# Patient Record
Sex: Male | Born: 1945
Health system: Southern US, Community
[De-identification: ages and names within clinical notes are randomized; demographics above are authoritative.]

## PROBLEM LIST (undated history)

## (undated) DIAGNOSIS — I10 Essential (primary) hypertension: Secondary | ICD-10-CM

## (undated) DIAGNOSIS — K219 Gastro-esophageal reflux disease without esophagitis: Secondary | ICD-10-CM

## (undated) DIAGNOSIS — G629 Polyneuropathy, unspecified: Secondary | ICD-10-CM

## (undated) DIAGNOSIS — C3491 Malignant neoplasm of unspecified part of right bronchus or lung: Secondary | ICD-10-CM

## (undated) DIAGNOSIS — J449 Chronic obstructive pulmonary disease, unspecified: Secondary | ICD-10-CM

## (undated) DIAGNOSIS — I4892 Unspecified atrial flutter: Secondary | ICD-10-CM

## (undated) DIAGNOSIS — Z9289 Personal history of other medical treatment: Secondary | ICD-10-CM

## (undated) DIAGNOSIS — J45909 Unspecified asthma, uncomplicated: Secondary | ICD-10-CM

## (undated) DIAGNOSIS — N419 Inflammatory disease of prostate, unspecified: Secondary | ICD-10-CM

## (undated) DIAGNOSIS — F32A Depression, unspecified: Secondary | ICD-10-CM

## (undated) DIAGNOSIS — E785 Hyperlipidemia, unspecified: Secondary | ICD-10-CM

## (undated) DIAGNOSIS — E119 Type 2 diabetes mellitus without complications: Secondary | ICD-10-CM

## (undated) DIAGNOSIS — D509 Iron deficiency anemia, unspecified: Secondary | ICD-10-CM

## (undated) DIAGNOSIS — R0600 Dyspnea, unspecified: Secondary | ICD-10-CM

## (undated) DIAGNOSIS — M1990 Unspecified osteoarthritis, unspecified site: Secondary | ICD-10-CM

## (undated) DIAGNOSIS — I509 Heart failure, unspecified: Secondary | ICD-10-CM

## (undated) DIAGNOSIS — R911 Solitary pulmonary nodule: Secondary | ICD-10-CM

## (undated) DIAGNOSIS — G4730 Sleep apnea, unspecified: Secondary | ICD-10-CM

## (undated) DIAGNOSIS — E039 Hypothyroidism, unspecified: Secondary | ICD-10-CM

## (undated) DIAGNOSIS — H3530 Unspecified macular degeneration: Secondary | ICD-10-CM

## (undated) DIAGNOSIS — F419 Anxiety disorder, unspecified: Secondary | ICD-10-CM

## (undated) DIAGNOSIS — Z9981 Dependence on supplemental oxygen: Secondary | ICD-10-CM

## (undated) DIAGNOSIS — I739 Peripheral vascular disease, unspecified: Secondary | ICD-10-CM

## (undated) DIAGNOSIS — N400 Enlarged prostate without lower urinary tract symptoms: Secondary | ICD-10-CM

## (undated) DIAGNOSIS — Z923 Personal history of irradiation: Secondary | ICD-10-CM

## (undated) DIAGNOSIS — F329 Major depressive disorder, single episode, unspecified: Secondary | ICD-10-CM

## (undated) DIAGNOSIS — D649 Anemia, unspecified: Secondary | ICD-10-CM

## (undated) DIAGNOSIS — Z87442 Personal history of urinary calculi: Secondary | ICD-10-CM

## (undated) HISTORY — DX: Anemia, unspecified: D64.9

## (undated) HISTORY — DX: Polyneuropathy, unspecified: G62.9

## (undated) HISTORY — DX: Inflammatory disease of prostate, unspecified: N41.9

## (undated) HISTORY — DX: Malignant neoplasm of unspecified part of right bronchus or lung: C34.91

## (undated) HISTORY — DX: Hyperlipidemia, unspecified: E78.5

## (undated) HISTORY — DX: Unspecified atrial flutter: I48.92

## (undated) HISTORY — DX: Type 2 diabetes mellitus without complications: E11.9

## (undated) HISTORY — DX: Chronic obstructive pulmonary disease, unspecified: J44.9

## (undated) HISTORY — DX: Solitary pulmonary nodule: R91.1

## (undated) HISTORY — PX: CATARACT EXTRACTION, BILATERAL: SHX1313

## (undated) HISTORY — PX: NO PAST SURGERIES: SHX2092

---

## 2008-10-26 DIAGNOSIS — R079 Chest pain, unspecified: Secondary | ICD-10-CM | POA: Insufficient documentation

## 2014-04-10 DIAGNOSIS — R3915 Urgency of urination: Secondary | ICD-10-CM | POA: Diagnosis not present

## 2014-04-10 DIAGNOSIS — R3912 Poor urinary stream: Secondary | ICD-10-CM | POA: Diagnosis not present

## 2014-04-10 DIAGNOSIS — R3911 Hesitancy of micturition: Secondary | ICD-10-CM | POA: Diagnosis not present

## 2014-04-10 DIAGNOSIS — R300 Dysuria: Secondary | ICD-10-CM | POA: Diagnosis not present

## 2014-04-11 DIAGNOSIS — L97411 Non-pressure chronic ulcer of right heel and midfoot limited to breakdown of skin: Secondary | ICD-10-CM | POA: Diagnosis not present

## 2014-04-25 DIAGNOSIS — L97411 Non-pressure chronic ulcer of right heel and midfoot limited to breakdown of skin: Secondary | ICD-10-CM | POA: Diagnosis not present

## 2014-05-16 DIAGNOSIS — B351 Tinea unguium: Secondary | ICD-10-CM | POA: Diagnosis not present

## 2014-05-16 DIAGNOSIS — L84 Corns and callosities: Secondary | ICD-10-CM | POA: Diagnosis not present

## 2014-05-16 DIAGNOSIS — E1142 Type 2 diabetes mellitus with diabetic polyneuropathy: Secondary | ICD-10-CM | POA: Diagnosis not present

## 2014-06-06 DIAGNOSIS — R3911 Hesitancy of micturition: Secondary | ICD-10-CM | POA: Diagnosis not present

## 2014-06-06 DIAGNOSIS — R3912 Poor urinary stream: Secondary | ICD-10-CM | POA: Diagnosis not present

## 2014-06-06 DIAGNOSIS — N401 Enlarged prostate with lower urinary tract symptoms: Secondary | ICD-10-CM | POA: Diagnosis not present

## 2014-06-06 DIAGNOSIS — R3915 Urgency of urination: Secondary | ICD-10-CM | POA: Diagnosis not present

## 2014-07-10 DIAGNOSIS — I10 Essential (primary) hypertension: Secondary | ICD-10-CM | POA: Diagnosis not present

## 2014-07-10 DIAGNOSIS — Z23 Encounter for immunization: Secondary | ICD-10-CM | POA: Diagnosis not present

## 2014-07-10 DIAGNOSIS — R5383 Other fatigue: Secondary | ICD-10-CM | POA: Diagnosis not present

## 2014-07-10 DIAGNOSIS — E1159 Type 2 diabetes mellitus with other circulatory complications: Secondary | ICD-10-CM | POA: Diagnosis not present

## 2014-07-10 DIAGNOSIS — M542 Cervicalgia: Secondary | ICD-10-CM | POA: Diagnosis not present

## 2014-08-15 DIAGNOSIS — B351 Tinea unguium: Secondary | ICD-10-CM | POA: Diagnosis not present

## 2014-08-15 DIAGNOSIS — E1142 Type 2 diabetes mellitus with diabetic polyneuropathy: Secondary | ICD-10-CM | POA: Diagnosis not present

## 2014-08-15 DIAGNOSIS — L84 Corns and callosities: Secondary | ICD-10-CM | POA: Diagnosis not present

## 2014-08-22 DIAGNOSIS — L97512 Non-pressure chronic ulcer of other part of right foot with fat layer exposed: Secondary | ICD-10-CM | POA: Diagnosis not present

## 2014-09-15 DIAGNOSIS — H3531 Nonexudative age-related macular degeneration: Secondary | ICD-10-CM | POA: Diagnosis not present

## 2014-09-15 DIAGNOSIS — H04123 Dry eye syndrome of bilateral lacrimal glands: Secondary | ICD-10-CM | POA: Diagnosis not present

## 2014-09-15 DIAGNOSIS — E119 Type 2 diabetes mellitus without complications: Secondary | ICD-10-CM | POA: Diagnosis not present

## 2014-09-15 DIAGNOSIS — Z961 Presence of intraocular lens: Secondary | ICD-10-CM | POA: Diagnosis not present

## 2014-09-16 DIAGNOSIS — Z91018 Allergy to other foods: Secondary | ICD-10-CM | POA: Diagnosis not present

## 2014-09-16 DIAGNOSIS — T781XXA Other adverse food reactions, not elsewhere classified, initial encounter: Secondary | ICD-10-CM | POA: Diagnosis not present

## 2014-09-16 DIAGNOSIS — Z79899 Other long term (current) drug therapy: Secondary | ICD-10-CM | POA: Diagnosis not present

## 2014-09-16 DIAGNOSIS — Z7951 Long term (current) use of inhaled steroids: Secondary | ICD-10-CM | POA: Diagnosis not present

## 2014-09-16 DIAGNOSIS — F17200 Nicotine dependence, unspecified, uncomplicated: Secondary | ICD-10-CM | POA: Diagnosis not present

## 2014-09-16 DIAGNOSIS — T7840XA Allergy, unspecified, initial encounter: Secondary | ICD-10-CM | POA: Diagnosis not present

## 2014-10-13 DIAGNOSIS — H04123 Dry eye syndrome of bilateral lacrimal glands: Secondary | ICD-10-CM | POA: Diagnosis not present

## 2014-10-13 DIAGNOSIS — H1032 Unspecified acute conjunctivitis, left eye: Secondary | ICD-10-CM | POA: Diagnosis not present

## 2014-10-13 DIAGNOSIS — Z961 Presence of intraocular lens: Secondary | ICD-10-CM | POA: Diagnosis not present

## 2014-11-09 DIAGNOSIS — L84 Corns and callosities: Secondary | ICD-10-CM | POA: Diagnosis not present

## 2014-11-09 DIAGNOSIS — E1142 Type 2 diabetes mellitus with diabetic polyneuropathy: Secondary | ICD-10-CM | POA: Diagnosis not present

## 2014-11-13 DIAGNOSIS — E1159 Type 2 diabetes mellitus with other circulatory complications: Secondary | ICD-10-CM | POA: Diagnosis not present

## 2014-11-13 DIAGNOSIS — E11621 Type 2 diabetes mellitus with foot ulcer: Secondary | ICD-10-CM | POA: Diagnosis not present

## 2014-11-13 DIAGNOSIS — E1143 Type 2 diabetes mellitus with diabetic autonomic (poly)neuropathy: Secondary | ICD-10-CM | POA: Diagnosis not present

## 2014-11-13 DIAGNOSIS — L97519 Non-pressure chronic ulcer of other part of right foot with unspecified severity: Secondary | ICD-10-CM | POA: Diagnosis not present

## 2014-11-13 DIAGNOSIS — E1169 Type 2 diabetes mellitus with other specified complication: Secondary | ICD-10-CM | POA: Diagnosis not present

## 2014-11-13 DIAGNOSIS — E785 Hyperlipidemia, unspecified: Secondary | ICD-10-CM | POA: Diagnosis not present

## 2014-11-13 DIAGNOSIS — I739 Peripheral vascular disease, unspecified: Secondary | ICD-10-CM | POA: Diagnosis not present

## 2014-11-13 DIAGNOSIS — W57XXXA Bitten or stung by nonvenomous insect and other nonvenomous arthropods, initial encounter: Secondary | ICD-10-CM | POA: Diagnosis not present

## 2014-11-13 DIAGNOSIS — I10 Essential (primary) hypertension: Secondary | ICD-10-CM | POA: Diagnosis not present

## 2014-11-30 DIAGNOSIS — L568 Other specified acute skin changes due to ultraviolet radiation: Secondary | ICD-10-CM | POA: Diagnosis not present

## 2014-12-22 DIAGNOSIS — R3911 Hesitancy of micturition: Secondary | ICD-10-CM | POA: Diagnosis not present

## 2014-12-22 DIAGNOSIS — N401 Enlarged prostate with lower urinary tract symptoms: Secondary | ICD-10-CM | POA: Diagnosis not present

## 2014-12-22 DIAGNOSIS — R3912 Poor urinary stream: Secondary | ICD-10-CM | POA: Diagnosis not present

## 2015-02-16 DIAGNOSIS — E1169 Type 2 diabetes mellitus with other specified complication: Secondary | ICD-10-CM | POA: Diagnosis not present

## 2015-02-16 DIAGNOSIS — Z23 Encounter for immunization: Secondary | ICD-10-CM | POA: Diagnosis not present

## 2015-02-16 DIAGNOSIS — E785 Hyperlipidemia, unspecified: Secondary | ICD-10-CM | POA: Diagnosis not present

## 2015-02-16 DIAGNOSIS — F17200 Nicotine dependence, unspecified, uncomplicated: Secondary | ICD-10-CM | POA: Diagnosis not present

## 2015-02-16 DIAGNOSIS — I10 Essential (primary) hypertension: Secondary | ICD-10-CM | POA: Diagnosis not present

## 2015-02-16 DIAGNOSIS — E1143 Type 2 diabetes mellitus with diabetic autonomic (poly)neuropathy: Secondary | ICD-10-CM | POA: Diagnosis not present

## 2015-02-16 DIAGNOSIS — E1151 Type 2 diabetes mellitus with diabetic peripheral angiopathy without gangrene: Secondary | ICD-10-CM | POA: Diagnosis not present

## 2015-04-05 DIAGNOSIS — S90422A Blister (nonthermal), left great toe, initial encounter: Secondary | ICD-10-CM | POA: Diagnosis not present

## 2015-04-13 DIAGNOSIS — G4733 Obstructive sleep apnea (adult) (pediatric): Secondary | ICD-10-CM | POA: Diagnosis not present

## 2015-04-17 DIAGNOSIS — B351 Tinea unguium: Secondary | ICD-10-CM | POA: Diagnosis not present

## 2015-04-17 DIAGNOSIS — E1142 Type 2 diabetes mellitus with diabetic polyneuropathy: Secondary | ICD-10-CM | POA: Diagnosis not present

## 2015-04-17 DIAGNOSIS — L84 Corns and callosities: Secondary | ICD-10-CM | POA: Diagnosis not present

## 2015-06-18 DIAGNOSIS — E1143 Type 2 diabetes mellitus with diabetic autonomic (poly)neuropathy: Secondary | ICD-10-CM | POA: Diagnosis not present

## 2015-06-18 DIAGNOSIS — E1169 Type 2 diabetes mellitus with other specified complication: Secondary | ICD-10-CM | POA: Diagnosis not present

## 2015-06-18 DIAGNOSIS — F17200 Nicotine dependence, unspecified, uncomplicated: Secondary | ICD-10-CM | POA: Diagnosis not present

## 2015-06-18 DIAGNOSIS — I10 Essential (primary) hypertension: Secondary | ICD-10-CM | POA: Diagnosis not present

## 2015-06-18 DIAGNOSIS — J449 Chronic obstructive pulmonary disease, unspecified: Secondary | ICD-10-CM | POA: Diagnosis not present

## 2015-06-18 DIAGNOSIS — E785 Hyperlipidemia, unspecified: Secondary | ICD-10-CM | POA: Diagnosis not present

## 2015-06-18 DIAGNOSIS — E1151 Type 2 diabetes mellitus with diabetic peripheral angiopathy without gangrene: Secondary | ICD-10-CM | POA: Diagnosis not present

## 2015-06-26 DIAGNOSIS — L84 Corns and callosities: Secondary | ICD-10-CM | POA: Diagnosis not present

## 2015-06-26 DIAGNOSIS — E1142 Type 2 diabetes mellitus with diabetic polyneuropathy: Secondary | ICD-10-CM | POA: Diagnosis not present

## 2015-06-26 DIAGNOSIS — B351 Tinea unguium: Secondary | ICD-10-CM | POA: Diagnosis not present

## 2015-08-24 DIAGNOSIS — H35031 Hypertensive retinopathy, right eye: Secondary | ICD-10-CM | POA: Diagnosis not present

## 2015-08-24 DIAGNOSIS — E119 Type 2 diabetes mellitus without complications: Secondary | ICD-10-CM | POA: Diagnosis not present

## 2015-08-24 DIAGNOSIS — H353131 Nonexudative age-related macular degeneration, bilateral, early dry stage: Secondary | ICD-10-CM | POA: Diagnosis not present

## 2015-08-24 DIAGNOSIS — I10 Essential (primary) hypertension: Secondary | ICD-10-CM | POA: Diagnosis not present

## 2015-10-19 DIAGNOSIS — H04123 Dry eye syndrome of bilateral lacrimal glands: Secondary | ICD-10-CM | POA: Diagnosis not present

## 2015-10-19 DIAGNOSIS — Z961 Presence of intraocular lens: Secondary | ICD-10-CM | POA: Diagnosis not present

## 2015-10-19 DIAGNOSIS — H43812 Vitreous degeneration, left eye: Secondary | ICD-10-CM | POA: Diagnosis not present

## 2015-10-19 DIAGNOSIS — H353131 Nonexudative age-related macular degeneration, bilateral, early dry stage: Secondary | ICD-10-CM | POA: Diagnosis not present

## 2015-11-16 DIAGNOSIS — I10 Essential (primary) hypertension: Secondary | ICD-10-CM | POA: Diagnosis not present

## 2015-11-16 DIAGNOSIS — F17200 Nicotine dependence, unspecified, uncomplicated: Secondary | ICD-10-CM | POA: Diagnosis not present

## 2015-11-16 DIAGNOSIS — Z1159 Encounter for screening for other viral diseases: Secondary | ICD-10-CM | POA: Diagnosis not present

## 2015-11-16 DIAGNOSIS — R3911 Hesitancy of micturition: Secondary | ICD-10-CM | POA: Diagnosis not present

## 2015-11-16 DIAGNOSIS — J449 Chronic obstructive pulmonary disease, unspecified: Secondary | ICD-10-CM | POA: Diagnosis not present

## 2015-11-16 DIAGNOSIS — E785 Hyperlipidemia, unspecified: Secondary | ICD-10-CM | POA: Diagnosis not present

## 2015-11-16 DIAGNOSIS — E1151 Type 2 diabetes mellitus with diabetic peripheral angiopathy without gangrene: Secondary | ICD-10-CM | POA: Diagnosis not present

## 2015-11-16 DIAGNOSIS — E1169 Type 2 diabetes mellitus with other specified complication: Secondary | ICD-10-CM | POA: Diagnosis not present

## 2015-11-22 DIAGNOSIS — L84 Corns and callosities: Secondary | ICD-10-CM | POA: Diagnosis not present

## 2015-11-22 DIAGNOSIS — E1142 Type 2 diabetes mellitus with diabetic polyneuropathy: Secondary | ICD-10-CM | POA: Diagnosis not present

## 2015-11-22 DIAGNOSIS — B351 Tinea unguium: Secondary | ICD-10-CM | POA: Diagnosis not present

## 2015-12-07 ENCOUNTER — Ambulatory Visit (INDEPENDENT_AMBULATORY_CARE_PROVIDER_SITE_OTHER): Payer: Medicare Other | Admitting: Family Medicine

## 2015-12-07 ENCOUNTER — Encounter: Payer: Self-pay | Admitting: Family Medicine

## 2015-12-07 ENCOUNTER — Encounter (INDEPENDENT_AMBULATORY_CARE_PROVIDER_SITE_OTHER): Payer: Self-pay

## 2015-12-07 VITALS — BP 153/77 | HR 80 | Temp 97.1°F | Wt 181.2 lb

## 2015-12-07 DIAGNOSIS — E1142 Type 2 diabetes mellitus with diabetic polyneuropathy: Secondary | ICD-10-CM | POA: Diagnosis not present

## 2015-12-07 DIAGNOSIS — J0100 Acute maxillary sinusitis, unspecified: Secondary | ICD-10-CM | POA: Diagnosis not present

## 2015-12-07 LAB — GLUCOSE HEMOCUE WAIVED: GLU HEMOCUE WAIVED: 127 mg/dL — AB (ref 65–99)

## 2015-12-07 MED ORDER — FLUTICASONE PROPIONATE 50 MCG/ACT NA SUSP: 2.0000 | Freq: Every day | NASAL | 6 refills | Status: DC

## 2015-12-07 MED ORDER — AMOXICILLIN 875 MG PO TABS: 875.0000 mg | ORAL_TABLET | Freq: Two times a day (BID) | ORAL | 0 refills | Status: DC

## 2015-12-07 NOTE — Progress Notes (Signed)
   Subjective:    Patient ID: John [name], male    DOB: 1946-02-18, 70 y.o.   MRN: [id number]  HPI New patient with history of diabetes, hypertension hyperlipidemia and BPH and COPD. Recently relocated to this area. Today's complaining of some sinus pressure pain cough symptoms have been present for greater than one week. Only medicine is taken has been ibuprofen. There are no active problems to display for this patient.  Outpatient Encounter Prescriptions as of 12/07/2015  Medication Sig  . ADVAIR HFA 45-21 MCG/ACT inhaler Inhale 2 puffs into the lungs daily as needed.  Marland Kitchen aspirin EC 81 MG tablet Take 81 mg by mouth daily.  . finasteride (PROSCAR) 5 MG tablet Take 1 tablet by mouth daily.  Marland Kitchen gabapentin (NEURONTIN) 300 MG capsule 1 tab in am, 2 tab in pm  . losartan-hydrochlorothiazide (HYZAAR) 100-25 MG tablet Take 1 tablet by mouth daily.  . meloxicam (MOBIC) 7.5 MG tablet Take 7.5 mg by mouth daily as needed for pain.  . pravastatin (PRAVACHOL) 40 MG tablet 1 tablet every third day  . SPIRIVA HANDIHALER 18 MCG inhalation capsule Place 1 capsule into inhaler and inhale daily as needed.  . tamsulosin (FLOMAX) 0.4 MG CAPS capsule Take 0.4 mg by mouth daily after breakfast.   No facility-administered encounter medications on file as of 12/07/2015.       Review of Systems  Constitutional: Negative.   Respiratory: Positive for shortness of breath.   Cardiovascular: Negative.   Genitourinary: Positive for difficulty urinating.  Neurological: Negative.   Psychiatric/Behavioral: Negative.        Objective:   Physical Exam BP (!) 153/77 (BP Location: Left Arm, Patient Position: Sitting, Cuff Size: Normal)   Pulse 80   Temp 97.1 F (36.2 C) (Oral)   Wt 181 lb 3.2 oz (82.2 kg)         Assessment & Plan:  1. Type 2 diabetes mellitus with diabetic polyneuropathy, without long-term current use of insulin (HCC) Fasting blood sugar today is 127. Given his history of some elevation of  liver functions and declining kidney function will await to see results of additional labs before initiating any treatment of diabetes I think sugars not so high today that there will be a problem with waiting - Glucose Hemocue Waived  2. Acute maxillary sinusitis, recurrence not specified We'll give him amoxicillin 875 twice a day along with Flonase.  Wardell Honour MD

## 2015-12-11 ENCOUNTER — Telehealth: Payer: Self-pay | Admitting: Family Medicine

## 2015-12-13 ENCOUNTER — Telehealth: Payer: Self-pay | Admitting: Family Medicine

## 2015-12-14 NOTE — Telephone Encounter (Signed)
Pt scheduled  

## 2015-12-28 ENCOUNTER — Ambulatory Visit (INDEPENDENT_AMBULATORY_CARE_PROVIDER_SITE_OTHER): Payer: Medicare Other

## 2015-12-28 DIAGNOSIS — Z23 Encounter for immunization: Secondary | ICD-10-CM

## 2016-01-31 DIAGNOSIS — E1142 Type 2 diabetes mellitus with diabetic polyneuropathy: Secondary | ICD-10-CM | POA: Diagnosis not present

## 2016-01-31 DIAGNOSIS — L84 Corns and callosities: Secondary | ICD-10-CM | POA: Diagnosis not present

## 2016-01-31 DIAGNOSIS — B351 Tinea unguium: Secondary | ICD-10-CM | POA: Diagnosis not present

## 2016-02-01 ENCOUNTER — Ambulatory Visit (INDEPENDENT_AMBULATORY_CARE_PROVIDER_SITE_OTHER): Payer: Medicare Other | Admitting: Family Medicine

## 2016-02-01 ENCOUNTER — Encounter: Payer: Self-pay | Admitting: Family Medicine

## 2016-02-01 VITALS — BP 136/79 | HR 83 | Temp 97.6°F | Ht 67.0 in | Wt 182.2 lb

## 2016-02-01 DIAGNOSIS — J449 Chronic obstructive pulmonary disease, unspecified: Secondary | ICD-10-CM | POA: Diagnosis not present

## 2016-02-01 DIAGNOSIS — N401 Enlarged prostate with lower urinary tract symptoms: Secondary | ICD-10-CM

## 2016-02-01 DIAGNOSIS — I10 Essential (primary) hypertension: Secondary | ICD-10-CM | POA: Diagnosis not present

## 2016-02-01 DIAGNOSIS — E1142 Type 2 diabetes mellitus with diabetic polyneuropathy: Secondary | ICD-10-CM

## 2016-02-01 DIAGNOSIS — E7800 Pure hypercholesterolemia, unspecified: Secondary | ICD-10-CM | POA: Diagnosis not present

## 2016-02-01 DIAGNOSIS — R3912 Poor urinary stream: Secondary | ICD-10-CM | POA: Diagnosis not present

## 2016-02-01 DIAGNOSIS — I152 Hypertension secondary to endocrine disorders: Secondary | ICD-10-CM | POA: Insufficient documentation

## 2016-02-01 DIAGNOSIS — N400 Enlarged prostate without lower urinary tract symptoms: Secondary | ICD-10-CM | POA: Insufficient documentation

## 2016-02-01 DIAGNOSIS — Z0000 Encounter for general adult medical examination without abnormal findings: Secondary | ICD-10-CM | POA: Diagnosis not present

## 2016-02-01 MED ORDER — UMECLIDINIUM BROMIDE 62.5 MCG/INH IN AEPB: 1.0000 | INHALATION_SPRAY | Freq: Every day | RESPIRATORY_TRACT | 2 refills | Status: DC

## 2016-02-01 MED ORDER — UMECLIDINIUM BROMIDE 62.5 MCG/INH IN AEPB: 1.0000 | INHALATION_SPRAY | Freq: Every day | RESPIRATORY_TRACT | Status: DC

## 2016-02-01 MED ORDER — IRBESARTAN-HYDROCHLOROTHIAZIDE 300-12.5 MG PO TABS: 0.5000 | ORAL_TABLET | Freq: Every day | ORAL | 1 refills | Status: DC

## 2016-02-01 NOTE — Progress Notes (Signed)
Subjective:    Patient ID: John [name], male    DOB: 09/14/1945, 70 y.o.   MRN: [id number]  HPI  Patient is here today for his annual physical exam.  Patient is also concerned about his blood pressure.  He reports it has been elevated at home. He has been taking losartan hydrochlorothiazide. I explained that this medicine may not be a true 24 hour drug as was previously followed in this may be impacting his blood pressure. He also brings in a note from his insurer saying they will not cover Spiriva anymore but would cover in [place] or a nora. Patient had seen urologist previously for prostatic hypertrophy. He was started on Proscar and Flomax. He is able to urinate easier now and wonders if he could perhaps stop the Flomax.   There are no active problems to display for this patient.  Outpatient Encounter Prescriptions as of 02/01/2016  Medication Sig  . ADVAIR HFA 45-21 MCG/ACT inhaler Inhale 2 puffs into the lungs daily as needed.  Marland Kitchen aspirin EC 81 MG tablet Take 81 mg by mouth daily.  . finasteride (PROSCAR) 5 MG tablet Take 1 tablet by mouth daily.  Marland Kitchen gabapentin (NEURONTIN) 300 MG capsule 1 tab in am, 2 tab in pm  . losartan-hydrochlorothiazide (HYZAAR) 100-25 MG tablet Take 1 tablet by mouth daily.  . meloxicam (MOBIC) 7.5 MG tablet Take 7.5 mg by mouth daily as needed for pain.  . pravastatin (PRAVACHOL) 40 MG tablet 1 tablet every third day  . SPIRIVA HANDIHALER 18 MCG inhalation capsule Place 1 capsule into inhaler and inhale daily as needed.  . tamsulosin (FLOMAX) 0.4 MG CAPS capsule Take 0.4 mg by mouth daily after breakfast.  . fluticasone (FLONASE) 50 MCG/ACT nasal spray Place 2 sprays into both nostrils daily. (Patient not taking: Reported on 02/01/2016)  . [DISCONTINUED] amoxicillin (AMOXIL) 875 MG tablet Take 1 tablet (875 mg total) by mouth 2 (two) times daily.   No facility-administered encounter medications on file as of 02/01/2016.      Review of Systems    Constitutional: Negative.   HENT: Negative.   Eyes: Negative.   Respiratory: Negative.   Cardiovascular: Negative.   Gastrointestinal: Negative.   Endocrine: Negative.   Genitourinary: Negative.   Musculoskeletal: Negative.   Skin: Negative.   Allergic/Immunologic: Negative.   Neurological: Negative.   Hematological: Negative.   Psychiatric/Behavioral: Negative.        Objective:   Physical Exam  Constitutional: He is oriented to person, place, and time. He appears well-developed and well-nourished.  HENT:  Mouth/Throat: Oropharynx is clear and moist.  Cardiovascular: Normal rate, regular rhythm, normal heart sounds and intact distal pulses.   Pulmonary/Chest: Effort normal and breath sounds normal.  Abdominal: Soft. Bowel sounds are normal.  Genitourinary: Prostate normal.  Musculoskeletal: Normal range of motion.  Neurological: He is alert and oriented to person, place, and time.  Psychiatric: He has a normal mood and affect.    BP 136/79   Pulse 83   Temp 97.6 F (36.4 C) (Oral)   Ht _0  (1.702 m)   Wt 182 lb 4 oz (82.7 kg)   BMI 28.54 kg/m         Assessment & Plan:  1. 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 There is a history of COPD - CMP14+EGFR - Lipid panel - CBC with Differential/Platelet  2. Type 2 diabetes mellitus with diabetic  polyneuropathy, without long-term current use of insulin ([place]) Patient is not currently being treated. We need to see where we are today with his blood sugar and A1c. He is new to the practice and we do not have any data to make judgments yet - CMP14+EGFR - Lipid panel - CBC with Differential/Platelet  3. Essential hypertension Will change to losartan to irbesartan or similar ARB and monitor pressures - CMP14+EGFR - Lipid panel - CBC with Differential/Platelet  4. Pure hypercholesterolemia Assess with lipid panel today and decide about treatment  after we see his numbers - CMP14+EGFR - Lipid panel - CBC with Differential/Platelet  5. Chronic obstructive pulmonary disease, unspecified COPD type ([place])  to IncruseSwitch from Spiriva  6. Benign prostatic hyperplasia with weak urinary stream A new Proscar but hold Flomax to see if he still needs it since prostate has probably responded.  Wardell Honour MD

## 2016-02-01 NOTE — Patient Instructions (Signed)
Medicare Annual Wellness Visit  [place] and the medical providers at [facility] strive to bring you the best medical care.  In doing so we not only want to address your current medical conditions and concerns but also to detect new conditions early and prevent illness, disease and health-related problems.    Medicare offers a yearly Wellness Visit which allows our clinical staff to assess your need for preventative services including immunizations, lifestyle education, counseling to decrease risk of preventable diseases and screening for fall risk and other medical concerns.    This visit is provided free of charge (no copay) for all Medicare recipients. The clinical pharmacists at [facility] have begun to conduct these Wellness Visits which will also include a thorough review of all your medications.    As you primary medical provider recommend that you make an appointment for your Annual Wellness Visit if you have not done so already this year.  You may set up this appointment before you leave today or you may call back ([phone]) and schedule an appointment.  Please make sure when you call that you mention that you are scheduling your Annual Wellness Visit with the clinical pharmacist so that the appointment may be made for the proper length of time.     Continue current medications. Continue good therapeutic lifestyle changes which include good diet and exercise. Fall precautions discussed with patient. If an FOBT was given today- please return it to our front desk. If you are over 50 years old - you may need Prevnar 13 or the adult Pneumonia vaccine.  After your visit with us today you will receive a survey in the mail or online from [employer] regarding your care with us. Please take a moment to fill this out. Your feedback is very important to us as you can help us better understand your patient needs as well as improve  your experience and satisfaction. WE CARE ABOUT YOU!!!    

## 2016-02-02 LAB — CBC WITH DIFFERENTIAL/PLATELET
BASOS ABS: 0 10*3/uL (ref 0.0–0.2)
BASOS: 0 %
EOS (ABSOLUTE): 0.2 10*3/uL (ref 0.0–0.4)
EOS: 3 %
HEMATOCRIT: 40.1 % (ref 37.5–51.0)
HEMOGLOBIN: 14 g/dL (ref 12.6–17.7)
IMMATURE GRANS (ABS): 0 10*3/uL (ref 0.0–0.1)
Immature Granulocytes: 0 %
LYMPHS ABS: 1.6 10*3/uL (ref 0.7–3.1)
Lymphs: 22 %
MCH: 32.4 pg (ref 26.6–33.0)
MCHC: 34.9 g/dL (ref 31.5–35.7)
MCV: 93 fL (ref 79–97)
Monocytes Absolute: 0.8 10*3/uL (ref 0.1–0.9)
Monocytes: 11 %
NEUTROS ABS: 4.8 10*3/uL (ref 1.4–7.0)
NEUTROS PCT: 64 %
Platelets: 328 10*3/uL (ref 150–379)
RBC: 4.32 x10E6/uL (ref 4.14–5.80)
RDW: 14.2 % (ref 12.3–15.4)
WBC: 7.5 10*3/uL (ref 3.4–10.8)

## 2016-02-02 LAB — LIPID PANEL
CHOL/HDL RATIO: 5.6 ratio — AB (ref 0.0–5.0)
CHOLESTEROL TOTAL: 169 mg/dL (ref 100–199)
HDL: 30 mg/dL — ABNORMAL LOW (ref >39)
LDL CALC: 107 mg/dL — AB (ref 0–99)
Triglycerides: 159 mg/dL — ABNORMAL HIGH (ref 0–149)
VLDL Cholesterol Cal: 32 mg/dL (ref 5–40)

## 2016-02-02 LAB — CMP14+EGFR
A/G RATIO: 1.4 (ref 1.2–2.2)
ALBUMIN: 4.2 g/dL (ref 3.6–4.8)
ALK PHOS: 101 IU/L (ref 39–117)
ALT: 13 IU/L (ref 0–44)
AST: 18 IU/L (ref 0–40)
BUN / CREAT RATIO: 17 (ref 10–24)
BUN: 17 mg/dL (ref 8–27)
Bilirubin Total: 0.3 mg/dL (ref 0.0–1.2)
CO2: 25 mmol/L (ref 18–29)
CREATININE: 0.99 mg/dL (ref 0.76–1.27)
Calcium: 9.5 mg/dL (ref 8.6–10.2)
Chloride: 97 mmol/L (ref 96–106)
GFR calc Af Amer: 89 mL/min/{1.73_m2} (ref >59)
GFR, EST NON AFRICAN AMERICAN: 77 mL/min/{1.73_m2} (ref >59)
GLOBULIN, TOTAL: 3 g/dL (ref 1.5–4.5)
Glucose: 152 mg/dL — ABNORMAL HIGH (ref 65–99)
POTASSIUM: 3.9 mmol/L (ref 3.5–5.2)
SODIUM: 140 mmol/L (ref 134–144)
Total Protein: 7.2 g/dL (ref 6.0–8.5)

## 2016-02-05 ENCOUNTER — Telehealth: Payer: Self-pay | Admitting: Family Medicine

## 2016-02-05 NOTE — Telephone Encounter (Signed)
Pt informed of lab results. 

## 2016-02-06 NOTE — Telephone Encounter (Signed)
I was hoping that he would try a serious diet and some weight loss first before instituting treatment. His sugar is not all that high.

## 2016-02-06 NOTE — Telephone Encounter (Signed)
Patient aware and verbalizes understanding. 

## 2016-02-06 NOTE — Telephone Encounter (Signed)
Pt notified of recommendation Verbalizes understanding 

## 2016-03-17 ENCOUNTER — Ambulatory Visit (INDEPENDENT_AMBULATORY_CARE_PROVIDER_SITE_OTHER): Payer: Medicare Other

## 2016-03-17 ENCOUNTER — Encounter: Payer: Self-pay | Admitting: Family Medicine

## 2016-03-17 ENCOUNTER — Encounter (INDEPENDENT_AMBULATORY_CARE_PROVIDER_SITE_OTHER): Payer: Self-pay

## 2016-03-17 ENCOUNTER — Ambulatory Visit (INDEPENDENT_AMBULATORY_CARE_PROVIDER_SITE_OTHER): Payer: Medicare Other | Admitting: Family Medicine

## 2016-03-17 VITALS — BP 174/78 | HR 92 | Temp 97.4°F | Ht 67.0 in | Wt 184.2 lb

## 2016-03-17 DIAGNOSIS — I10 Essential (primary) hypertension: Secondary | ICD-10-CM | POA: Diagnosis not present

## 2016-03-17 DIAGNOSIS — R079 Chest pain, unspecified: Secondary | ICD-10-CM

## 2016-03-17 DIAGNOSIS — R0600 Dyspnea, unspecified: Secondary | ICD-10-CM

## 2016-03-17 DIAGNOSIS — R739 Hyperglycemia, unspecified: Secondary | ICD-10-CM

## 2016-03-17 DIAGNOSIS — J449 Chronic obstructive pulmonary disease, unspecified: Secondary | ICD-10-CM | POA: Diagnosis not present

## 2016-03-17 LAB — BAYER DCA HB A1C WAIVED: HB A1C (BAYER DCA - WAIVED): 7.1 % — ABNORMAL HIGH (ref <7.0)

## 2016-03-17 MED ORDER — BISOPROLOL FUMARATE 5 MG PO TABS: 5.0000 mg | ORAL_TABLET | Freq: Every day | ORAL | 2 refills | Status: DC

## 2016-03-17 MED ORDER — AZITHROMYCIN 250 MG PO TABS: ORAL_TABLET | ORAL | 0 refills | Status: DC

## 2016-03-17 NOTE — Progress Notes (Signed)
HPI  Patient presents today here with shortness of breath and "chest cramps".  Patient states over the last 2 weeks she's had off and on shortness of breath and "chest cramps".  Depression states that inconsistently he has shortness of breath with activity. At times he also has central chest pain described as crampy type pain that resolves after a few minutes of rest. He states that his shortness of breath is similar t previous shortness of breath with COPD. He has cut back on smoking from 2-1/2 packs per day to one pack per day.  Patient states that over the last couple of days he has had some blood-tinged sputum. This recurred this morning when he first woke up, however whenever he looked again it was not there any longer.  He uses a CPAP and state sthat this may be due to throat irritation   PMH: Smoking status noted ROS: Per HPI  Objective: BP (!) 174/78   Pulse 92   Temp 97.4 F (36.3 C) (Oral)   Ht '5\' 7"'$  (1.702 m)   Wt 184 lb 3.2 oz (83.6 kg)   BMI 28.85 kg/m  Gen: NAD, alert, cooperative with exam HEENT: NCAT, oropharynx clear, TMs normal bilaterally, nares clear  CV: RRR, good S1/S2, no murmur, CP not reproducible Resp: CTABL, no wheezes, non-labored Ext: No edema, warm Neuro: Alert and oriented, No gross deficits  EKG- NSR, no acute finsdings  Assessment and plan:  # Chest pain, dyspnea Likely multifactorial, COPD certainly provides some explanation for his symptoms. With exertional symptoms and history of diabetes, smoking, and OSA I'm very suspicious for some underlying cardiac etiology as well Starting beta blocker today, patient states previously it was not well tolerated, try one half tablet for 2 weeks, then increase Referring to cardiology Labs are up-to-date except for A1c, checking, previously well controlled and metformin was discontinued due to good control.  Chose Bisoprolol to avoid BB induced bronchospasm  # COPD In sitting current symptoms I  have covered him with azithromycin for possible COPD exacerbation No wheezes to necessitate steroid burst Continue current meds  # Elevated blood sugar With history of type 2 diabetes A1c pending  # Hypertension On ARB plus small dose of HCTZ Adding beta blocker due to suspicion of underlying cardiac disease Close Cardiology F/u Could easily increase to whole tab of avalide   Considering Blood tinged sputum- CXR today, Treat with azithro. Very low threshold for CT chest to eval for underlying malignancy with heavy smoke exposure.    Orders Placed This Encounter  Procedures  . DG Chest 2 View    Standing Status:   Future    Number of Occurrences:   1    Standing Expiration Date:   05/18/2017    Order Specific Question:   Reason for Exam (SYMPTOM  OR DIAGNOSIS REQUIRED)    Answer:   blood tinged sputum, smoker, dyspnea- CAP eval    Order Specific Question:   Preferred imaging location?    Answer:   Internal  . Bayer DCA Hb A1c Waived  . Ambulatory referral to Cardiology    Referral Priority:   Routine    Referral Type:   Consultation    Referral Reason:   Specialty Services Required    Requested Specialty:   Cardiology    Number of Visits Requested:   1  . EKG 12-Lead    Meds ordered this encounter  Medications  . azithromycin (ZITHROMAX) 250 MG tablet    Sig: Take  2 tablets on day 1 and 1 tablet daily after that    Dispense:  6 tablet    Refill:  0  . bisoprolol (ZEBETA) 5 MG tablet    Sig: Take 1 tablet (5 mg total) by mouth daily.    Dispense:  30 tablet    Refill:  [place], MD [facility] 03/17/2016, 12:12 PM

## 2016-03-17 NOTE — Patient Instructions (Signed)
Great to see you!  Lets follow up in 3-4 weeks  You will be called for a cardiology referral.   Start bisoprolol 1 pill once daily ( for blood pressure but protects the heart as well)  Azithromycin is an antibiotic, finish all pills.    Nonspecific Chest Pain Chest pain can be caused by many different conditions. There is a chance that your pain could be related to something serious, such as a heart attack or a blood clot in your lungs. Chest pain can also be caused by conditions that are not life-threatening. If you have chest pain, it is very important to follow up with your doctor. Follow these instructions at home: Medicines  If you were prescribed an antibiotic medicine, take it as told by your doctor. Do not stop taking the antibiotic even if you start to feel better.  Take over-the-counter and prescription medicines only as told by your doctor. Lifestyle  Do not use any products that contain nicotine or tobacco, such as cigarettes and e-cigarettes. If you need help quitting, ask your doctor.  Do not drink alcohol.  Make lifestyle changes as told by your doctor. These may include:  Getting regular exercise. Ask your doctor for some activities that are safe for you.  Eating a heart-healthy diet. A diet specialist (dietitian) can help you to learn healthy eating options.  Staying at a healthy weight.  Managing diabetes, if needed.  Lowering your stress, as with deep breathing or spending time in nature. General instructions  Avoid any activities that make you feel chest pain.  If your chest pain is because of heartburn:  Raise (elevate) the head of your bed about 6 inches (15 cm). You can do this by putting blocks under the bed legs at the head of the bed.  Do not sleep with extra pillows under your head. That does not help heartburn.  Keep all follow-up visits as told by your doctor. This is important. This includes any further testing if your chest pain does not go  away. Contact a doctor if:  Your chest pain does not go away.  You have a rash with blisters on your chest.  You have a fever.  You have chills. Get help right away if:  Your chest pain is worse.  You have a cough that gets worse, or you cough up blood.  You have very bad (severe) pain in your belly (abdomen).  You are very weak.  You pass out (faint).  You have either of these for no clear reason:  Sudden chest discomfort.  Sudden discomfort in your arms, back, neck, or jaw.  You have shortness of breath at any time.  You suddenly start to sweat, or your skin gets clammy.  You feel sick to your stomach (nauseous).  You throw up (vomit).  You suddenly feel light-headed or dizzy.  Your heart starts to beat fast, or it feels like it is skipping beats. These symptoms may be an emergency. Do not wait to see if the symptoms will go away. Get medical help right away. Call your local emergency services (911 in the [place] Do not drive yourself to the hospital.  This information is not intended to replace advice given to you by your health care provider. Make sure you discuss any questions you have with your health care provider. Document Released: 09/10/2007 Document Revised: 12/17/2015 Document Reviewed: 12/17/2015 Elsevier Interactive Patient Education  2017 [employer].

## 2016-04-17 ENCOUNTER — Encounter: Payer: Self-pay | Admitting: Family Medicine

## 2016-04-17 ENCOUNTER — Encounter (INDEPENDENT_AMBULATORY_CARE_PROVIDER_SITE_OTHER): Payer: Self-pay

## 2016-04-17 ENCOUNTER — Ambulatory Visit (HOSPITAL_COMMUNITY)
Admission: RE | Admit: 2016-04-17 | Discharge: 2016-04-17 | Disposition: A | Discharge status: Home / Self Care | Payer: Medicare Other | Source: Ambulatory Visit | Attending: Family Medicine | Admitting: Family Medicine

## 2016-04-17 ENCOUNTER — Ambulatory Visit (INDEPENDENT_AMBULATORY_CARE_PROVIDER_SITE_OTHER): Payer: Medicare Other | Admitting: Family Medicine

## 2016-04-17 ENCOUNTER — Ambulatory Visit (INDEPENDENT_AMBULATORY_CARE_PROVIDER_SITE_OTHER): Payer: Medicare Other

## 2016-04-17 VITALS — BP 159/81 | HR 91 | Temp 98.1°F | Ht 67.0 in | Wt 184.2 lb

## 2016-04-17 DIAGNOSIS — J449 Chronic obstructive pulmonary disease, unspecified: Secondary | ICD-10-CM | POA: Diagnosis not present

## 2016-04-17 DIAGNOSIS — J181 Lobar pneumonia, unspecified organism: Secondary | ICD-10-CM

## 2016-04-17 DIAGNOSIS — B351 Tinea unguium: Secondary | ICD-10-CM | POA: Diagnosis not present

## 2016-04-17 DIAGNOSIS — I10 Essential (primary) hypertension: Secondary | ICD-10-CM

## 2016-04-17 DIAGNOSIS — J984 Other disorders of lung: Secondary | ICD-10-CM

## 2016-04-17 DIAGNOSIS — J439 Emphysema, unspecified: Secondary | ICD-10-CM | POA: Diagnosis not present

## 2016-04-17 DIAGNOSIS — R918 Other nonspecific abnormal finding of lung field: Secondary | ICD-10-CM

## 2016-04-17 DIAGNOSIS — L84 Corns and callosities: Secondary | ICD-10-CM | POA: Diagnosis not present

## 2016-04-17 DIAGNOSIS — J189 Pneumonia, unspecified organism: Secondary | ICD-10-CM

## 2016-04-17 DIAGNOSIS — E1142 Type 2 diabetes mellitus with diabetic polyneuropathy: Secondary | ICD-10-CM | POA: Diagnosis not present

## 2016-04-17 LAB — POCT I-STAT CREATININE: Creatinine, Ser: 0.8 mg/dL (ref 0.61–1.24)

## 2016-04-17 MED ORDER — IRBESARTAN-HYDROCHLOROTHIAZIDE 300-12.5 MG PO TABS: 1.0000 | ORAL_TABLET | Freq: Every day | ORAL | 1 refills | Status: DC

## 2016-04-17 MED ORDER — IOPAMIDOL (ISOVUE-300) INJECTION 61%
75.0000 mL | Freq: Once | INTRAVENOUS | Status: AC | PRN
  Administered 2016-04-17: 75 mL via INTRAVENOUS

## 2016-04-17 NOTE — Addendum Note (Signed)
Addended by: [name] on: 04/17/2016 05:36 PM   Modules accepted: Orders

## 2016-04-17 NOTE — Patient Instructions (Signed)
Great to see you!  Lets increase your Irbesartan/HCTZ to a whole pill.   We will call with x ray results when they are done.

## 2016-04-17 NOTE — Progress Notes (Addendum)
HPI  Patient presents today for follow-up pneumonia, hypertension, and COPD.  COPD Patient states that breathing is better on incruse  He was seen last visit for chest pain, is at least moderately worrisome for cardiac etiology, in workup he was found to have community-acquired pneumonia. He was treated with azithromycin and did have improvement. However about one week ago he developed a little bit of cough, chest congestion, mild headache. He had one day of diarrhea which has resolved.  He tried bisoprolol and had nausea, he stopped this.  PMH: Smoking status noted ROS: Per HPI  Objective: BP (!) 159/81   Pulse 91   Temp 98.1 F (36.7 C) (Oral)   Ht '5\' 7"'$  (1.702 m)   Wt 184 lb 3.2 oz (83.6 kg)   BMI 28.85 kg/m  Gen: NAD, alert, cooperative with exam HEENT: NCAT, EOMI, PERRL CV: RRR, good S1/S2, no murmur Resp: Coarse breath sounds in the right middle lung field, nonlabored Ext: No edema, warm Neuro: Alert and oriented, No gross deficits  Assessment and plan:  # Community-acquired pneumonia Right middle lobe still with some coarse breath sounds, repeat chest x-ray Treated with azithromycin, if infiltrate still present will escalate antibiotics and consider repeat or further more dense imaging  #Persistent lung density Chest x-ray today with persistent lung density, followed up with patient in the x-ray suite. CT scan of the chest ordered which will be performed today.  # COPD Cough could be due to underlying COPD, this has been improved on incruse- continue  # Hypertension Elevated today, mostly elevated on review of the chart Did not tolerate bisoprolol, increase irbesartan/HCTZ from one half pill to 1 pill daily     Orders Placed This Encounter  Procedures  . DG Chest 2 View    Standing Status:   Future    Number of Occurrences:   1    Standing Expiration Date:   06/15/2017    Order Specific Question:   Reason for Exam (SYMPTOM  OR DIAGNOSIS REQUIRED)   Answer:   F/u R middle lobe infiltrate 1 month ago    Order Specific Question:   Preferred imaging location?    Answer:   Internal  . CT CHEST W CONTRAST    Standing Status:   Future    Standing Expiration Date:   06/15/2017    Order Specific Question:   If indicated for the ordered procedure, I authorize the administration of contrast media per Radiology protocol    Answer:   Yes    Order Specific Question:   Reason for Exam (SYMPTOM  OR DIAGNOSIS REQUIRED)    Answer:   Peristent density on XR    Order Specific Question:   Preferred imaging location?    Answer:   [facility]    Order Specific Question:   Call Results- Best Contact Number?    Answer:   [phone] not necessary to hold     Meds ordered this encounter  Medications  . irbesartan-hydrochlorothiazide (AVALIDE) 300-12.5 MG tablet    Sig: Take 1 tablet by mouth daily.    Dispense:  90 tablet    Refill:  Dustin [place], MD [facility] 04/17/2016, 9:38 AM   Addendum PT returning to clinic to discuss CT chest. CT is consistent with malignancy. All questions answered to the best of my ability. Will work on referral to [place], consider PET scan order if appt is not timely.   Laroy Apple, MD [facility]  [place] 04/17/2016, 5:29 PM

## 2016-04-18 ENCOUNTER — Other Ambulatory Visit (HOSPITAL_COMMUNITY): Payer: Self-pay | Admitting: Oncology

## 2016-04-18 DIAGNOSIS — R918 Other nonspecific abnormal finding of lung field: Secondary | ICD-10-CM

## 2016-04-18 DIAGNOSIS — C3491 Malignant neoplasm of unspecified part of right bronchus or lung: Secondary | ICD-10-CM

## 2016-04-18 HISTORY — DX: Malignant neoplasm of unspecified part of right bronchus or lung: C34.91

## 2016-04-24 ENCOUNTER — Ambulatory Visit (HOSPITAL_COMMUNITY)
Admission: RE | Admit: 2016-04-24 | Discharge: 2016-04-24 | Disposition: A | Discharge status: Home / Self Care | Payer: Medicare Other | Source: Ambulatory Visit | Attending: Oncology | Admitting: Oncology

## 2016-04-24 DIAGNOSIS — R938 Abnormal findings on diagnostic imaging of other specified body structures: Secondary | ICD-10-CM | POA: Diagnosis not present

## 2016-04-24 DIAGNOSIS — R918 Other nonspecific abnormal finding of lung field: Secondary | ICD-10-CM | POA: Diagnosis not present

## 2016-04-24 LAB — GLUCOSE, CAPILLARY: Glucose-Capillary: 156 mg/dL — ABNORMAL HIGH (ref 65–99)

## 2016-04-24 MED ORDER — FLUDEOXYGLUCOSE F - 18 (FDG) INJECTION
10.0100 | Freq: Once | INTRAVENOUS | Status: AC | PRN
  Administered 2016-04-24: 10.01 via INTRAVENOUS

## 2016-04-26 ENCOUNTER — Other Ambulatory Visit: Payer: Self-pay | Admitting: Family Medicine

## 2016-04-28 ENCOUNTER — Ambulatory Visit (INDEPENDENT_AMBULATORY_CARE_PROVIDER_SITE_OTHER): Payer: Medicare Other | Admitting: Cardiovascular Disease

## 2016-04-28 ENCOUNTER — Encounter: Payer: Self-pay | Admitting: *Deleted

## 2016-04-28 ENCOUNTER — Encounter: Payer: Self-pay | Admitting: Cardiovascular Disease

## 2016-04-28 VITALS — BP 196/100 | HR 88 | Ht 67.0 in | Wt 183.6 lb

## 2016-04-28 DIAGNOSIS — R079 Chest pain, unspecified: Secondary | ICD-10-CM

## 2016-04-28 DIAGNOSIS — E7800 Pure hypercholesterolemia, unspecified: Secondary | ICD-10-CM

## 2016-04-28 DIAGNOSIS — I2510 Atherosclerotic heart disease of native coronary artery without angina pectoris: Secondary | ICD-10-CM

## 2016-04-28 DIAGNOSIS — I160 Hypertensive urgency: Secondary | ICD-10-CM

## 2016-04-28 DIAGNOSIS — R0609 Other forms of dyspnea: Secondary | ICD-10-CM

## 2016-04-28 MED ORDER — AMLODIPINE BESYLATE 5 MG PO TABS: ORAL_TABLET | ORAL | 0 refills | Status: DC

## 2016-04-28 NOTE — Patient Instructions (Addendum)
Medication Instructions:   Norvasc '5mg'$  - one tab now & one tab tomorrow morning.   Continue all other medications.    Labwork: none  Testing/Procedures:  Your physician has requested that you have an echocardiogram. Echocardiography is a painless test that uses sound waves to create images of your heart. It provides your doctor with information about the size and shape of your heart and how well your heart's chambers and valves are working. This procedure takes approximately one hour. There are no restrictions for this procedure.  Your physician has requested that you have a lexiscan myoview. For further information please visit [email or web address]. Please follow instruction sheet, as given.  Office will contact with results via phone or letter.    Follow-Up: 6 weeks   Any Other Special Instructions Will Be Listed Below (If Applicable). Go to primary MD office tomorrow for BP check.    If you need a refill on your cardiac medications before your next appointment, please call your pharmacy.

## 2016-04-28 NOTE — Addendum Note (Signed)
Addended by: Laurine Blazer on: 04/28/2016 02:27 PM   Modules accepted: Orders

## 2016-04-28 NOTE — Progress Notes (Signed)
CARDIOLOGY CONSULT NOTE  Patient ID: John [name] MRN: [id number] DOB/AGE: 1945/09/09 71 y.o.  Admit date: (Not on file) Primary Physician: [name], MD Referring Physician:   Reason for Consultation: chest pain  HPI: The patient is a 71 year old male with a history of hypertension and COPD referred for evaluation of chest pain by Dr. Wendi Snipes.  Chest CT on 04/17/16 showed a small pericardial effusion and calcification within the LAD and RCA.  There were bilateral upper lobe pulmonary lesion suspicious for malignancy with enlarged lymph nodes as well as emphysema.  PET scan suspicious for primary bronchogenic carcinoma in the left and right upper lobes.  ECG showed sinus rhythm with a nonspecific T wave abnormality in the lateral leads on 03/17/16.  He says when he walks 300 yards he experiences significant exertional dyspnea. He also occasionally has had chest tightness but shortness of breath predominates. He now does not walk much because of this. He currently denies chest pain and a headache.  Blood pressure is 207/106.  He had been taking a full tablet of Avalide 300/12.5 mg daily but this was leading to significant diarrhea so he cut back on his own to a half tablet daily.  Social history: Smokes 1-1/2 packs of cigarettes daily for 55 years.  Family history: Father died of CVA at age 28.    No Known Allergies  Current Outpatient Prescriptions  Medication Sig Dispense Refill  . aspirin EC 81 MG tablet Take 81 mg by mouth daily.    . finasteride (PROSCAR) 5 MG tablet Take 1 tablet by mouth daily.    . fluticasone (FLONASE) 50 MCG/ACT nasal spray Place 2 sprays into both nostrils daily. 16 g 6  . gabapentin (NEURONTIN) 300 MG capsule 1 tab in am, 2 tab in pm    . irbesartan-hydrochlorothiazide (AVALIDE) 300-12.5 MG tablet Take 1 tablet by mouth daily. 90 tablet 1  . meloxicam (MOBIC) 7.5 MG tablet Take 7.5 mg by mouth daily as needed for pain.    .  pravastatin (PRAVACHOL) 40 MG tablet 1 tablet every third day    . tamsulosin (FLOMAX) 0.4 MG CAPS capsule Take 0.4 mg by mouth daily after breakfast.    . umeclidinium bromide (INCRUSE ELLIPTA) 62.5 MCG/INH AEPB Inhale 1 puff into the lungs daily. 7 each 2   No current facility-administered medications for this visit.     Past Medical History:  Diagnosis Date  . COPD (chronic obstructive pulmonary disease) ([place])   . Diabetes mellitus without complication ([place])   . Hyperlipidemia   . Neuropathy ([place])   . Prostatitis     No past surgical history on file.  Social History   Social History  . Marital status: Single    Spouse name: N/A  . Number of children: N/A  . Years of education: N/A   Occupational History  . Not on file.   Social History Main Topics  . Smoking status: Current Every Day Smoker    Packs/day: 0.50    Years: 55.00    Types: Cigarettes    Start date: 03/11/1961  . Smokeless tobacco: Never Used  . Alcohol use No  . Drug use: No  . Sexual activity: Not on file   Other Topics Concern  . Not on file   Social History Narrative  . No narrative on file       Prior to Admission medications   Medication Sig Start Date End Date Taking? Authorizing Provider  [place] [phone]  MCG/ACT inhaler Inhale 2 puffs into the lungs daily as needed. 10/02/15   Historical Provider, MD  aspirin EC 81 MG tablet Take 81 mg by mouth daily.    Historical Provider, MD  finasteride (PROSCAR) 5 MG tablet Take 1 tablet by mouth daily. 12/01/15   Historical Provider, MD  fluticasone (FLONASE) 50 MCG/ACT nasal spray Place 2 sprays into both nostrils daily. 12/07/15   Wardell Honour, MD  gabapentin (NEURONTIN) 300 MG capsule 1 tab in am, 2 tab in pm 11/12/15   Historical Provider, MD  irbesartan-hydrochlorothiazide (AVALIDE) 300-12.5 MG tablet Take 1 tablet by mouth daily. 04/17/16   [name], MD  meloxicam (MOBIC) 7.5 MG tablet Take 7.5 mg by mouth daily as needed for pain.     Historical Provider, MD  pravastatin (PRAVACHOL) 40 MG tablet 1 tablet every third day 11/12/15   Historical Provider, MD  tamsulosin (FLOMAX) 0.4 MG CAPS capsule Take 0.4 mg by mouth daily after breakfast.    Historical Provider, MD  umeclidinium bromide (INCRUSE ELLIPTA) 62.5 MCG/INH AEPB Inhale 1 puff into the lungs daily. 02/01/16   Wardell Honour, MD     Review of systems complete and found to be negative unless listed above in HPI     Physical exam Blood pressure (!) 207/106, pulse 96, height '5\' 7"'$  (1.702 m), weight 183 lb 9.6 oz (83.3 kg), SpO2 96 %. General: NAD Neck: No JVD, no thyromegaly or thyroid nodule.  Lungs: Clear to auscultation bilaterally with normal respiratory effort. CV: Nondisplaced PMI. Regular rate and rhythm, normal S1/S2, no S3/S4, no murmur.  No peripheral edema.    Abdomen: Soft, nontender, no hepatosplenomegaly, no distention.  Skin: Intact without lesions or rashes.  Neurologic: Alert and oriented x 3.  Psych: Normal affect. Extremities: No clubbing or cyanosis.  HEENT: Normal.   ECG: Most recent ECG reviewed.  Labs:   Lab Results  Component Value Date   WBC 7.5 02/01/2016   HCT 40.1 02/01/2016   MCV 93 02/01/2016   PLT 328 02/01/2016   No results for input(s): NA, K, CL, CO2, BUN, CREATININE, CALCIUM, PROT, BILITOT, ALKPHOS, ALT, AST, GLUCOSE in the last 168 hours.  Invalid input(s): LABALBU No results found for: CKTOTAL, CKMB, CKMBINDEX, TROPONINI  Lab Results  Component Value Date   CHOL 169 02/01/2016   Lab Results  Component Value Date   HDL 30 (L) 02/01/2016   Lab Results  Component Value Date   LDLCALC 107 (H) 02/01/2016   Lab Results  Component Value Date   TRIG 159 (H) 02/01/2016   Lab Results  Component Value Date   CHOLHDL 5.6 (H) 02/01/2016   No results found for: LDLDIRECT       Studies: No results found.  ASSESSMENT AND PLAN:  1. Chest pain and exertional dyspnea: Quite possibly related to primary  bronchogenic carcinoma of bilateral upper lobes. However, will obtain Lexiscan Myoview stress test to rule out ischemic heart disease. Coronary artery calcifications within the LAD and RCA seen on CT as described above. Cardiovascular risk factors include hypertension, hyperlipidemia, and tobacco abuse. Will also obtain echocardiogram to evaluate cardiac structure and function.  2. Hypertension urgency: Will recheck BP now. Encouraged to purchase cuff. Also instructed him to have BP rechecked tomorrow at [facility] office.  May need to start amlodipine 5 mg daily.  3. Hyperlipidemia: On pravastatin.  4. Tobacco abuse disorder   Dispo: fu 6 wks   Signed: [name], M.D., F.A.C.C.  04/28/2016, 1:51 PM

## 2016-04-30 ENCOUNTER — Encounter (HOSPITAL_COMMUNITY): Payer: Medicare Other | Attending: Oncology | Admitting: Oncology

## 2016-04-30 ENCOUNTER — Encounter (HOSPITAL_COMMUNITY): Payer: Self-pay | Admitting: Oncology

## 2016-04-30 VITALS — BP 192/98 | HR 96 | Temp 98.0°F | Resp 18 | Ht 67.0 in | Wt 184.3 lb

## 2016-04-30 DIAGNOSIS — Z720 Tobacco use: Secondary | ICD-10-CM

## 2016-04-30 DIAGNOSIS — R918 Other nonspecific abnormal finding of lung field: Secondary | ICD-10-CM

## 2016-04-30 DIAGNOSIS — R0789 Other chest pain: Secondary | ICD-10-CM | POA: Diagnosis not present

## 2016-04-30 NOTE — Assessment & Plan Note (Addendum)
Right upper lung lesions, multifocal, 1.7 and 2.0 cm with right hilar, right paratracheal lymphadenopathy.  Left upper lobe lesion (1.3 cm) demonstrates low metabolic activity.  Order placed for MRI brain w and wo contrast to complete staging.  I have messaged Dr. [name] for consultation for diagnostic biopsy and lymph node staging.  Of note, the left upper lobe lesion with low metabolic activity needs evaluation too to see if he has unilateral disease (Right).  This will be important for staging and treatment options.  Patient is educated, on a very elementary level, provided regarding bronchogenic carcinoma.  Without complete staging and biopsy proven disease, conversation was limited and simple.  Return in 2-3 weeks for follow-up, after diagnostic biopsy.

## 2016-04-30 NOTE — Progress Notes (Signed)
[facility] Hematology/Oncology Consultation   Name: John [name]      MRN: [id number]   Date: 04/30/2016 Time:4:50 PM   REFERRING PHYSICIAN:  [name], MD (Primary Care Provider)  REASON FOR CONSULT:  Pulmonary lesion(s)   DIAGNOSIS:  Multifocal RUL pulmonary lesions, 1.7 cm and 2.0 cm with RIGHT hilar and paratracheal lymphadenopathy with a LUL pulmonary lesion with low hypermetabolic activity measuring 1.1 cm.  HISTORY OF PRESENT ILLNESS:   John [name] is a 71 y.o. male with a medical history significant for COPD, HTN, BP, DM, tobacco abuse who is referred to the [facility] for newly found pulmonary masses.  He reports a chest cold with symptoms of shortness of breath and chest pain on exertion.  He was given antibiotics for presumed community acquired pneumonia.  Follow-up chest xray on 04/17/2016 demonstrated a 2.8 cm anterior lung base density.  CT imaging was then performed on 04/17/2016 that demonstrated bilateral pulmonary lesions (RUL- 3.8 cm and LUL 1.3 cm with right paratracheal, right hilar, and left paratracheal lymphadenopathy).  He was therefore referred to medical oncology for further work-up and management.  Upon referral, order was placed and PET scan was scheduled on 04/24/2016.  He reports a good appetite without weight loss.  His cough has resolved.  He denies any hemoptysis.  He denies any headaches or vision changes.  He denies any new pain.  He has ongoing chest discomfort with exertion, in addition to shortness of breath.  He has an appointment with cardiology on 05/05/2016 for stress test and EKG.  He has a significant smoking history and continues to smoke although he has cut back to 1/2 ppd.  He notes that he is quite nervous about imaging findings   Review of Systems  Constitutional: Negative.  Negative for chills, fever and weight loss.  HENT: Negative.   Eyes: Negative.   Respiratory: Positive for shortness of  breath (with exertion). Negative for cough, hemoptysis and sputum production.   Cardiovascular: Positive for chest pain (with exertion).  Gastrointestinal: Negative.  Negative for constipation, diarrhea, nausea and vomiting.  Genitourinary: Negative.   Musculoskeletal: Negative.   Skin: Negative.   Neurological: Negative.  Negative for weakness.  Endo/Heme/Allergies: Negative.   Psychiatric/Behavioral: Positive for substance abuse (tobacco).  14 point review of systems was performed and is negative except as detailed under history of present illness and above   PAST MEDICAL HISTORY:   Past Medical History:  Diagnosis Date  . COPD (chronic obstructive pulmonary disease) ([place])   . Diabetes mellitus without complication ([place])   . Hyperlipidemia   . Neuropathy ([place])   . Prostatitis     ALLERGIES: No Known Allergies    MEDICATIONS: I have reviewed the patient's current medications.    Current Outpatient Prescriptions on File Prior to Visit  Medication Sig Dispense Refill  . amLODipine (NORVASC) 5 MG tablet Take one tablet by mouth now and take one tablet tomorrow morning. 2 tablet 0  . aspirin EC 81 MG tablet Take 81 mg by mouth daily.    . finasteride (PROSCAR) 5 MG tablet Take 1 tablet by mouth daily.    . fluticasone (FLONASE) 50 MCG/ACT nasal spray Place 2 sprays into both nostrils daily. 16 g 6  . gabapentin (NEURONTIN) 300 MG capsule 1 tab in am, 2 tab in pm    . irbesartan-hydrochlorothiazide (AVALIDE) 300-12.5 MG tablet Take 1 tablet by mouth daily. 90 tablet 1  . meloxicam (  MOBIC) 7.5 MG tablet TAKE 1 TABLET EVERY DAY FOR BACK PAIN 90 tablet 0  . pravastatin (PRAVACHOL) 40 MG tablet 1 tablet every third day    . tamsulosin (FLOMAX) 0.4 MG CAPS capsule Take 0.4 mg by mouth daily after breakfast.    . umeclidinium bromide (INCRUSE ELLIPTA) 62.5 MCG/INH AEPB Inhale 1 puff into the lungs daily. 7 each 2   No current facility-administered medications on file prior to visit.       PAST SURGICAL HISTORY History reviewed. No pertinent surgical history.  FAMILY HISTORY: Family History  Problem Relation Age of Onset  . Hypertension Mother   . Diabetes Father   . Heart disease Father   . Hypertension Sister    Mother deceased at age 37 from renal failure Father deceased at age 22 from stroke. He has 5 children:  45 yo son  70 yo daughter  32 yo daughter  40 yo daughter  33 yo son He denies any cancer in his family  SOCIAL HISTORY:  reports that he has been smoking Cigarettes.  He started smoking about 55 years ago. He has a 82.50 pack-year smoking history. He has never used smokeless tobacco. He reports that he does not drink alcohol or use drugs.  He recently cut his smoking down to 0.5 ppd.  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  Social History   Social History  . Marital status: Single    Spouse name: N/A  . Number of children: N/A  . Years of education: N/A   Social History Main Topics  . Smoking status: Current Every Day Smoker    Packs/day: 1.50    Years: 55.00    Types: Cigarettes    Start date: 03/11/1961  . Smokeless tobacco: Never Used  . Alcohol use No  . Drug use: No  . Sexual activity: Not Asked   Other Topics Concern  . None   Social History Narrative  . None    PERFORMANCE STATUS: The patient's performance status is 0 - Asymptomatic  PHYSICAL EXAM: Most Recent Vital Signs: Blood pressure (!) 192/98, pulse 96, temperature 98 F (36.7 C), temperature source Oral, resp. rate 18, height '5\' 7"'$  (1.702 m), weight 184 lb 4.8 oz (83.6 kg), SpO2 98 %. BP (!) 192/98 (BP Location: Right Arm, Patient Position: Sitting)   Pulse 96   Temp 98 F (36.7 C) (Oral)   Resp 18   Ht '5\' 7"'$  (1.702 m)   Wt 184 lb 4.8 oz (83.6 kg)   SpO2 98%   BMI 28.87 kg/m   General Appearance:    Alert, cooperative, no distress, appears stated age  Head:    Normocephalic, without obvious abnormality, atraumatic   Eyes:    PERRL, conjunctiva/corneas clear.       Ears:    Not examined  Nose:   Nares normal, septum midline, mucosa normal.  Throat:   Lips, mucosa, and tongue normal.  Neck:   Supple, symmetrical, trachea midline, no adenopathy;       thyroid:  No enlargement/tenderness/nodules.  Back:     Symmetric, no curvature, ROM normal, no CVA tenderness  Lungs:     Clear to auscultation bilaterally, respirations unlabored  Chest wall:    No tenderness or deformity  Heart:    Regular rate and rhythm, S1 and S2 normal, no murmur, rub   or gallop  Abdomen:     Soft, non-tender, bowel sounds active all four quadrants,  no masses, no organomegaly  Genitalia:    Not examined  Rectal:    Not examined.  Extremities:   Extremities normal, atraumatic, no cyanosis or edema  Pulses:   2+ and symmetric all extremities  Skin:   Skin color, texture, turgor normal, no rashes or lesions  Lymph nodes:   Cervical, supraclavicular, and axillary nodes normal  Neurologic:   CNII-XII intact. Normal strength, sensation and reflexes      throughout    LABORATORY DATA:  No results found for this or any previous visit (from the past 48 hour(s)).   CBC    Component Value Date/Time   WBC 7.5 02/01/2016 1007   RBC 4.32 02/01/2016 1007   HCT 40.1 02/01/2016 1007   PLT 328 02/01/2016 1007   MCV 93 02/01/2016 1007   MCH 32.4 02/01/2016 1007   MCHC 34.9 02/01/2016 1007   RDW 14.2 02/01/2016 1007   LYMPHSABS 1.6 02/01/2016 1007   EOSABS 0.2 02/01/2016 1007   BASOSABS 0.0 02/01/2016 1007    RADIOGRAPHY: CLINICAL DATA:  Initial treatment strategy for lung mass.  EXAM: NUCLEAR MEDICINE PET SKULL BASE TO THIGH  TECHNIQUE: 10.0 mCi F-18 FDG was injected intravenously. Full-ring PET imaging was performed from the skull base to thigh after the radiotracer. CT data was obtained and used for attenuation correction and anatomic localization.  FASTING BLOOD GLUCOSE:  Value: 15 mg/dl  COMPARISON:  Chest CT  04/17/2016  FINDINGS: NECK  No hypermetabolic lymph nodes in the neck.  CHEST  Adjacent hypermetabolic nodes in the nodules in the RIGHT upper lobe measure 1.7 and 2.0 cm (image 16 and 18 ; series 7). Both these nodules are hypermetabolic with SUV max equal 18.  Hypermetabolic RIGHT hilar lymph node is small with SUV max equal 4.4. Hypermetabolic RIGHT paratracheal lymph node is small at 6 mm with SUV max equal 3.8. No hypermetabolic contralateral lymph nodes. No hypermetabolic supraclavicular nodes.  Within the LEFT upper lobe 11 mm nodule (image 8, series 7) has low metabolic activity SUV max equal 1.6.  Within the LEFT lower lobe 6 mm nodule (image 40, series 7) does not have clear metabolic activity.  ABDOMEN/PELVIS  No abnormal hypermetabolic activity within the liver, pancreas, adrenal glands, or spleen. No hypermetabolic lymph nodes in the abdomen or pelvis.  SKELETON  No aggressive osseous lesion.  IMPRESSION: 1. Adjacent hypermetabolic nodules in the RIGHT upper lobe consistent bronchogenic carcinoma. 2. Suspicion of ipsilateral hilar and paratracheal nodal metastasis. 3. Nodule in the LEFT upper lobe with suspicious morphology has a relatively low metabolic activity for size. Favor synchronous primary bronchogenic carcinoma. 4. No metabolic activity associated with a small LEFT lower lobe pulmonary nodule. Recommend attention on follow-up.   Electronically Signed   By: Suzy Bouchard M.D.   On: 04/24/2016 13:54   PATHOLOGY:  None  ASSESSMENT/PLAN:  Bilateral Upper Lung masses Tobacco Abuse Chest Pain with exertion  Right upper lung lesions, multifocal, 1.7 and 2.0 cm with right hilar, right paratracheal lymphadenopathy.  Left upper lobe lesion (1.3 cm) demonstrates low metabolic activity. Question of synchronous primaries vs. Metastatic disease.   Order placed for MRI brain w and wo contrast to complete staging.  I have messaged  Dr. [name] for consultation for diagnostic biopsy and lymph node staging.  Of note, the left upper lobe lesion with low metabolic activity needs evaluation too to see if he has unilateral disease (Right).  This will be important for staging and treatment options.  Patient is educated, on  a very elementary level, provided regarding bronchogenic carcinoma.  Without complete staging and biopsy proven disease, conversation was limited and simple.  Return in 2-3 weeks for follow-up, after diagnostic biopsy. He seems to have a good understanding that findings are highly suggestive of malignancy.  I addressed the importance of smoking cessation with the patient in detail.  We discussed the health benefits of cessation.  We discussed the health detriments of ongoing tobacco use including but not limited to COPD, heart disease and malignancy. We reviewed the multiple options for cessation and I offered to refer him to smoking cessation classes. We discussed other alternatives to quit such as chantix, wellbutrin. We will continue to address this moving forward.  He is to keep upcoming appointments with cardiology.  ORDERS PLACED FOR THIS ENCOUNTER: Orders Placed This Encounter  Procedures  . MR Brain W Wo Contrast   All questions were answered. The patient knows to call the clinic with any problems, questions or concerns. We can certainly see the patient much sooner if necessary.  This note is electronically signed XA:[id number],[id number] Cyril Mourning, MD  04/30/2016 4:50 PM

## 2016-04-30 NOTE — Patient Instructions (Signed)
[place] at [facility] Discharge Instructions  RECOMMENDATIONS MADE BY THE CONSULTANT AND ANY TEST RESULTS WILL BE SENT TO YOUR REFERRING PHYSICIAN.  We will schedule you for a MRI of brain. We will refer you to Dr. [name] for surgery consultation and biopsy planning. We will see you back after biopsy for follow-up and further recommendations.  Thank you for choosing [place] at [facility] to provide your oncology and hematology care.  To afford each patient quality time with our provider, please arrive at least 15 minutes before your scheduled appointment time.    If you have a lab appointment with the [facility] please come in thru the  Main Entrance and check in at the main information desk  You need to re-schedule your appointment should you arrive 10 or more minutes late.  We strive to give you quality time with our providers, and arriving late affects you and other patients whose appointments are after yours.  Also, if you no show three or more times for appointments you may be dismissed from the clinic at the providers discretion.     Again, thank you for choosing [facility].  Our hope is that these requests will decrease the amount of time that you wait before being seen by our physicians.       _____________________________________________________________  Should you have questions after your visit to [facility], please contact our office at [phone] between the hours of 8:30 a.m. and 4:30 p.m.  Voicemails left after 4:30 p.m. will not be returned until the following business day.  For prescription refill requests, have your pharmacy contact our office.       Resources For Cancer Patients and their Caregivers ? American Cancer Society: Can assist with transportation, wigs, general needs, runs Look Good Feel Better.        934-617-2173 ? Cancer Care: Provides financial assistance,  online support groups, medication/co-pay assistance.  1-800-813-HOPE 909 490 4137) ? [facility] Assists [place] cancer patients and their families through emotional , educational and financial support.  228-140-4458 ? [place] DSS Where to apply for food stamps, Medicaid and utility assistance. (403)093-2894 ? [facility]: Transportation to medical appointments. 626-801-2746 ? Social Security Administration: May apply for disability if have a Stage IV cancer. (726)630-3239 867 067 7823 ? [employer], Disability and Transit Services: Assists with nutrition, care and transit needs. [place] Support Programs: '@10RELATIVEDAYS'$ @ > Cancer Support Group  2nd Tuesday of the month 1pm-2pm, Journey Room  > Creative Journey  3rd Tuesday of the month 1130am-1pm, Journey Room  > Look Good Feel Better  1st Wednesday of the month 10am-12 noon, Journey Room (Call [place] to register [phone])

## 2016-05-01 ENCOUNTER — Encounter (HOSPITAL_COMMUNITY): Payer: Self-pay | Admitting: Oncology

## 2016-05-05 ENCOUNTER — Encounter (HOSPITAL_COMMUNITY)
Admission: RE | Admit: 2016-05-05 | Discharge: 2016-05-05 | Disposition: A | Discharge status: Home / Self Care | Payer: Medicare Other | Source: Ambulatory Visit | Attending: Cardiovascular Disease | Admitting: Cardiovascular Disease

## 2016-05-05 ENCOUNTER — Ambulatory Visit (HOSPITAL_COMMUNITY)
Admission: RE | Admit: 2016-05-05 | Discharge: 2016-05-05 | Disposition: A | Discharge status: Home / Self Care | Payer: Medicare Other | Source: Ambulatory Visit | Attending: Cardiovascular Disease | Admitting: Cardiovascular Disease

## 2016-05-05 ENCOUNTER — Inpatient Hospital Stay (HOSPITAL_COMMUNITY): Admission: RE | Admit: 2016-05-05 | Payer: Medicare Other | Source: Ambulatory Visit

## 2016-05-05 ENCOUNTER — Encounter (HOSPITAL_COMMUNITY): Payer: Self-pay

## 2016-05-05 DIAGNOSIS — R0609 Other forms of dyspnea: Secondary | ICD-10-CM

## 2016-05-05 DIAGNOSIS — R9439 Abnormal result of other cardiovascular function study: Secondary | ICD-10-CM | POA: Insufficient documentation

## 2016-05-05 DIAGNOSIS — I361 Nonrheumatic tricuspid (valve) insufficiency: Secondary | ICD-10-CM | POA: Diagnosis not present

## 2016-05-05 DIAGNOSIS — I313 Pericardial effusion (noninflammatory): Secondary | ICD-10-CM | POA: Diagnosis not present

## 2016-05-05 DIAGNOSIS — I340 Nonrheumatic mitral (valve) insufficiency: Secondary | ICD-10-CM | POA: Diagnosis not present

## 2016-05-05 DIAGNOSIS — I358 Other nonrheumatic aortic valve disorders: Secondary | ICD-10-CM | POA: Insufficient documentation

## 2016-05-05 DIAGNOSIS — I2510 Atherosclerotic heart disease of native coronary artery without angina pectoris: Secondary | ICD-10-CM | POA: Insufficient documentation

## 2016-05-05 LAB — NM MYOCAR MULTI W/SPECT W/WALL MOTION / EF
CHL CUP NUCLEAR SDS: 2
CHL CUP NUCLEAR SRS: 4
CHL CUP NUCLEAR SSS: 6
LHR: 0.33
LV sys vol: 20 mL
LVDIAVOL: 51 mL (ref 62–150)
NUC STRESS TID: 0.9
Peak HR: 116 {beats}/min
Rest HR: 79 {beats}/min

## 2016-05-05 LAB — ECHOCARDIOGRAM COMPLETE
EWDT: 127 ms
FS: 37 % (ref 28–44)
IV/PV OW: 0.84
LA vol A4C: 58.6 ml
LA vol index: 24.3 mL/m2
LADIAMINDEX: 1.99 cm/m2
LASIZE: 40 mm
LAVOL: 48.8 mL
LEFT ATRIUM END SYS DIAM: 40 mm
LV PW d: 11.1 mm — AB (ref 0.6–1.1)
LV SIMPSON'S DISK: 57
LV dias vol index: 31 mL/m2
LV dias vol: 62 mL (ref 62–150)
LVOT SV: 86 mL
LVOT VTI: 27.4 cm
LVOT area: 3.14 cm2
LVOT diameter: 20 mm
LVOT peak grad rest: 7 mmHg
LVOT peak vel: 133 cm/s
LVSYSVOL: 27 mL (ref 21–61)
LVSYSVOLIN: 13 mL/m2
MV Dec: 127
MVPG: 11 mmHg
MVPKEVEL: 164 m/s
RV TAPSE: 16.5 mm
Stroke v: 36 ml

## 2016-05-05 MED ORDER — TECHNETIUM TC 99M TETROFOSMIN IV KIT
30.0000 | PACK | Freq: Once | INTRAVENOUS | Status: AC | PRN
  Administered 2016-05-05: 30.2 via INTRAVENOUS

## 2016-05-05 MED ORDER — REGADENOSON 0.4 MG/5ML IV SOLN
INTRAVENOUS | Status: AC
  Administered 2016-05-05: 0.4 mg via INTRAVENOUS
  Filled 2016-05-05: qty 5

## 2016-05-05 MED ORDER — SODIUM CHLORIDE 0.9% FLUSH
INTRAVENOUS | Status: AC
  Administered 2016-05-05: 10 mL via INTRAVENOUS
  Filled 2016-05-05: qty 10

## 2016-05-05 MED ORDER — TECHNETIUM TC 99M TETROFOSMIN IV KIT
10.0000 | PACK | Freq: Once | INTRAVENOUS | Status: AC | PRN
  Administered 2016-05-05: 10.8 via INTRAVENOUS

## 2016-05-05 NOTE — Progress Notes (Signed)
*  PRELIMINARY RESULTS* Echocardiogram 2D Echocardiogram has been performed.  John [name] 05/05/2016, 11:37 AM

## 2016-05-06 ENCOUNTER — Other Ambulatory Visit: Payer: Self-pay

## 2016-05-06 ENCOUNTER — Ambulatory Visit (HOSPITAL_COMMUNITY)
Admission: RE | Admit: 2016-05-06 | Discharge: 2016-05-06 | Disposition: A | Discharge status: Home / Self Care | Payer: Medicare Other | Source: Ambulatory Visit | Attending: Oncology | Admitting: Oncology

## 2016-05-06 DIAGNOSIS — R918 Other nonspecific abnormal finding of lung field: Secondary | ICD-10-CM | POA: Diagnosis not present

## 2016-05-06 DIAGNOSIS — I313 Pericardial effusion (noninflammatory): Secondary | ICD-10-CM

## 2016-05-06 DIAGNOSIS — C3490 Malignant neoplasm of unspecified part of unspecified bronchus or lung: Secondary | ICD-10-CM | POA: Diagnosis not present

## 2016-05-06 DIAGNOSIS — I3139 Other pericardial effusion (noninflammatory): Secondary | ICD-10-CM

## 2016-05-06 MED ORDER — GADOBENATE DIMEGLUMINE 529 MG/ML IV SOLN
17.0000 mL | Freq: Once | INTRAVENOUS | Status: AC | PRN
  Administered 2016-05-06: 17 mL via INTRAVENOUS

## 2016-05-08 ENCOUNTER — Other Ambulatory Visit: Payer: Self-pay | Admitting: Family Medicine

## 2016-05-12 ENCOUNTER — Institutional Professional Consult (permissible substitution) (INDEPENDENT_AMBULATORY_CARE_PROVIDER_SITE_OTHER): Payer: Medicare Other | Admitting: Thoracic Surgery (Cardiothoracic Vascular Surgery)

## 2016-05-12 ENCOUNTER — Encounter: Payer: Self-pay | Admitting: Thoracic Surgery (Cardiothoracic Vascular Surgery)

## 2016-05-12 ENCOUNTER — Other Ambulatory Visit: Payer: Self-pay | Admitting: *Deleted

## 2016-05-12 ENCOUNTER — Telehealth: Payer: Self-pay | Admitting: Family Medicine

## 2016-05-12 VITALS — BP 220/104 | HR 108 | Resp 16 | Ht 67.0 in | Wt 184.0 lb

## 2016-05-12 DIAGNOSIS — I2510 Atherosclerotic heart disease of native coronary artery without angina pectoris: Secondary | ICD-10-CM | POA: Diagnosis not present

## 2016-05-12 DIAGNOSIS — R590 Localized enlarged lymph nodes: Secondary | ICD-10-CM

## 2016-05-12 DIAGNOSIS — R918 Other nonspecific abnormal finding of lung field: Secondary | ICD-10-CM

## 2016-05-12 NOTE — Telephone Encounter (Signed)
Appt scheduled. Pt notified.

## 2016-05-12 NOTE — Telephone Encounter (Signed)
Needs to be seen so we can review pressures and adjust meds.   Laroy Apple, MD [facility] 05/12/2016, 4:56 PM

## 2016-05-12 NOTE — Progress Notes (Signed)
PCP is [name], MD Referring Provider is Sheldon Silvan Manon Hilding, PA-C  Chief Complaint  Patient presents with  . Lung Lesion    Surgical eval, Chest CT 04/17/16, PET Scan 04/24/16, Brain MRI 05/06/16...had ECHO 05/05/16, to have repeat on 05/14/16 for moderate PERICARDIAL EFFUSION, MYOCARDIAL NUCLEAR MED STUDY 05/05/16    HPI: [name] is a 71 year old gentleman sent for consultation regarding bilateral lung nodules and mediastinal adenopathy.  [name] is a 71 year old man with a history of hypertension, hyperlipidemia, type 2 diabetes with peripheral neuropathy, and COPD. He recently had a chest cold and was hurting across his chest. He had a chest x-ray which showed a possible pneumonia. He was treated with antibiotics and went back 3 weeks later for a repeat chest x-ray, which was unchanged. He then was referred for a CT which showed a 3.8 cm bilobed mass in the right upper lobe and a 1.3 cm left upper lobe nodule. There was some mild right paratracheal hilar and left paratracheal adenopathy. He had a PET/CT which showed the right upper lobe mass or masses was markedly hypermetabolic. The left upper lobe nodule is mildly hypermetabolic as were the right paratracheal lymph nodes.  He recently has been having problems with blood pressure control. He gets short of breath with exertion, but can walk up to 300 yards prior to it being significant. He has not had any change in appetite or weight loss. He does complain of decreased energy. He has had a productive cough. He denies hemoptysis. He complains of wheezing. He has sleep apnea and uses CPAP at night. He saw Dr.Koneswaran. A stress Myoview was a low risk study. He has not had any significant weight loss. He has been smoking since age 64. Maximum use was 1.5 packs per day. He currently says he's down to 0.5 packs per day  Zubrod Score: At the time of surgery this patient's most appropriate activity status/level should be described as: '[]'$     0     Normal activity, no symptoms '[x]'$     1    Restricted in physical strenuous activity but ambulatory, able to do out light work '[]'$     2    Ambulatory and capable of self care, unable to do work activities, up and about >50 % of waking hours                              '[]'$     3    Only limited self care, in bed greater than 50% of waking hours '[]'$     4    Completely disabled, no self care, confined to bed or chair '[]'$     5    Moribund    Past Medical History:  Diagnosis Date  . COPD (chronic obstructive pulmonary disease) ([place])   . Diabetes mellitus without complication ([place])   . Hyperlipidemia   . Neuropathy ([place])   . Prostatitis   Hypertension  No past surgical history on file.  Family History  Problem Relation Age of Onset  . Hypertension Mother   . Diabetes Father   . Heart disease Father   . Hypertension Sister     Social History Social History  Substance Use Topics  . Smoking status: Current Every Day Smoker    Packs/day: 1.50    Years: 55.00    Types: Cigarettes    Start date: 03/11/1961  . Smokeless tobacco: Never Used  . Alcohol use No  Current Outpatient Prescriptions  Medication Sig Dispense Refill  . aspirin EC 81 MG tablet Take 81 mg by mouth daily.    . finasteride (PROSCAR) 5 MG tablet Take 1 tablet by mouth daily.    . fluticasone (FLONASE) 50 MCG/ACT nasal spray Place 2 sprays into both nostrils daily. 16 g 6  . gabapentin (NEURONTIN) 300 MG capsule 1 tab in am, 2 tab in pm    . INCRUSE ELLIPTA 62.5 MCG/INH AEPB INHALE 1 PUFF INTO THE LUNGS DAILY. 30 each 2  . irbesartan-hydrochlorothiazide (AVALIDE) 300-12.5 MG tablet Take 1 tablet by mouth daily. 90 tablet 1  . meloxicam (MOBIC) 7.5 MG tablet TAKE 1 TABLET EVERY DAY FOR BACK PAIN 90 tablet 0  . pravastatin (PRAVACHOL) 40 MG tablet 1 tablet every third day    . tamsulosin (FLOMAX) 0.4 MG CAPS capsule Take 0.4 mg by mouth daily after breakfast.    . amLODipine (NORVASC) 5 MG tablet Take one tablet by  mouth now and take one tablet tomorrow morning. (Patient not taking: Reported on 05/12/2016) 2 tablet 0   No current facility-administered medications for this visit.     No Known Allergies  Review of Systems  Constitutional: Positive for activity change and fatigue. Negative for appetite change and unexpected weight change.  HENT: Positive for dental problem (dentures). Negative for trouble swallowing and voice change.   Eyes: Negative for visual disturbance.  Respiratory: Positive for apnea (CPAP at night), cough, shortness of breath and wheezing.   Gastrointestinal: Negative for abdominal pain and blood in stool.  Genitourinary: Positive for frequency and urgency. Negative for dysuria.  Musculoskeletal: Positive for arthralgias and joint swelling.  Neurological: Positive for numbness (feet).  Hematological: Negative for adenopathy.  All other systems reviewed and are negative.   BP (!) 220/104 (BP Location: Left Arm, Patient Position: Sitting, Cuff Size: Large)   Pulse (!) 108   Resp 16   Ht '5\' 7"'$  (1.702 m)   Wt 184 lb (83.5 kg)   SpO2 98% Comment: RA  BMI 28.82 kg/m  Physical Exam  Constitutional: He is oriented to person, place, and time. He appears well-developed and well-nourished. No distress.  HENT:  Head: Normocephalic and atraumatic.  Mouth/Throat: No oropharyngeal exudate.  Eyes: Conjunctivae and EOM are normal. No scleral icterus.  Neck: Neck supple. No thyromegaly present.  Cardiovascular: Normal rate, regular rhythm and normal heart sounds.  Exam reveals no gallop and no friction rub.   No murmur heard. Pulmonary/Chest: Effort normal. No respiratory distress. He has no wheezes. He has no rales.  Abdominal: Soft. He exhibits no distension. There is no tenderness.  Musculoskeletal: He exhibits no edema.  Lymphadenopathy:    He has no cervical adenopathy.  Neurological: He is alert and oriented to person, place, and time. No cranial nerve deficit. He exhibits  normal muscle tone.  Skin: Skin is warm and dry.  Vitals reviewed.    Diagnostic Tests: CT CHEST WITH CONTRAST  TECHNIQUE: Multidetector CT imaging of the chest was performed during intravenous contrast administration.  CONTRAST:  73m ISOVUE-300 IOPAMIDOL (ISOVUE-300) INJECTION 61%  COMPARISON:  None.  FINDINGS: Cardiovascular: Normal heart size. Small pericardial effusion. Aortic atherosclerosis noted. Calcification within the LAD and RCA coronary arteries noted.  Mediastinum/Nodes: The trachea appears patent and is midline. Normal appearance of the esophagus. Left paratracheal lymph node is borderline enlarged measuring 10 mm, image 54 of series 2. Right paratracheal lymph node measures 9 mm, image 45 of series 2. Lymph node in the  right hilar region measures 11 mm, image 63 of series 2.  Lungs/Pleura: There is no pleural fluid. Moderate to advanced changes of centrilobular emphysema. There is a mass within the right upper lobe which measures 3.1 x 3.8 by 3.0 cm. Diffuse bronchial wall thickening identified. Left upper lobe pulmonary nodule is identified measuring 1.3 cm, image 23 of series 3.  Upper Abdomen: There is no acute abnormality identified within the visualized portions of the upper abdomen.  Musculoskeletal: Multi level degenerative disc disease is identified within the thoracic spine. No aggressive lytic or sclerotic bone lesions identified.  IMPRESSION: 1. Examination is positive for bilateral upper lobe pulmonary lesions suspicious for malignancy. Further investigation with PET-CT and tissue sampling is advised. 2. Borderline enlarged paratracheal lymph nodes and right hilar nodes. Cannot rule out metastatic adenopathy. Attention to these areas on PET-CT is advised. 3. Emphysema 4. Aortic atherosclerosis and multi vessel coronary artery calcification.   Electronically Signed   By: Kerby Moors M.D.   On: 04/17/2016 11:19 NUCLEAR  MEDICINE PET SKULL BASE TO THIGH  TECHNIQUE: 10.0 mCi F-18 FDG was injected intravenously. Full-ring PET imaging was performed from the skull base to thigh after the radiotracer. CT data was obtained and used for attenuation correction and anatomic localization.  FASTING BLOOD GLUCOSE:  Value: 15 mg/dl  COMPARISON:  Chest CT 04/17/2016  FINDINGS: NECK  No hypermetabolic lymph nodes in the neck.  CHEST  Adjacent hypermetabolic nodes in the nodules in the RIGHT upper lobe measure 1.7 and 2.0 cm (image 16 and 18 ; series 7). Both these nodules are hypermetabolic with SUV max equal 18.  Hypermetabolic RIGHT hilar lymph node is small with SUV max equal 4.4. Hypermetabolic RIGHT paratracheal lymph node is small at 6 mm with SUV max equal 3.8. No hypermetabolic contralateral lymph nodes. No hypermetabolic supraclavicular nodes.  Within the LEFT upper lobe 11 mm nodule (image 8, series 7) has low metabolic activity SUV max equal 1.6.  Within the LEFT lower lobe 6 mm nodule (image 40, series 7) does not have clear metabolic activity.  ABDOMEN/PELVIS  No abnormal hypermetabolic activity within the liver, pancreas, adrenal glands, or spleen. No hypermetabolic lymph nodes in the abdomen or pelvis.  SKELETON  No aggressive osseous lesion.  IMPRESSION: 1. Adjacent hypermetabolic nodules in the RIGHT upper lobe consistent bronchogenic carcinoma. 2. Suspicion of ipsilateral hilar and paratracheal nodal metastasis. 3. Nodule in the LEFT upper lobe with suspicious morphology has a relatively low metabolic activity for size. Favor synchronous primary bronchogenic carcinoma. 4. No metabolic activity associated with a small LEFT lower lobe pulmonary nodule. Recommend attention on follow-up.   Electronically Signed   By: Suzy Bouchard M.D.   On: 04/24/2016 13:54 I personally reviewed the CT and PET CT images and concur with the findings noted above    Impression: 71 year old man with a long-standing history of tobacco abuse and known COPD who has bilateral upper lobe nodules along with some hilar and mediastinal adenopathy. His CT and PET CT findings are highly suspicious for synchronous primary bronchogenic carcinomas. His left upper lobe nodule appeared to be stage IA. Also possible this metastatic disease morphologically appears more consistent with a second primary. His right lung mass is more concerning and likely is at least stage II, if not stage III. I think the best way to approach this from a diagnostic standpoint would be to do a navigational bronchoscopy which will allow [place] to access both sides at the same setting. There is of course  no guarantee of a definitive diagnosis with that.  I discussed the proposed procedure of electromagnetic navigational bronchoscopy, endobronchial ultrasound, and possible mediastinoscopy with Mr. Ayyad in his friend. I informed him of the general nature of the procedure and the need for general anesthesia. We will plan to do this on an outpatient basis. He does understand this is diagnostic and not therapeutic. I informed him of the indications, risks, benefits, and alternatives. He understands that there is a risk that the procedure will be nondiagnostic. He understands the risks include, but are not limited to death, MI, DVT, PE, stroke, bleeding, infection, pneumothorax, recurrent nerve injury leading to hoarseness, an esophageal injury.  He accepts those risks and wishes to proceed. He very much wants to know what he is dealing with.  Hypertension- unfortunately his blood pressures extremely poorly controlled. His been running over 190 for several weeks now. He was initially 373 systolic on arrival here today and I recommended he go to the emergency room, but he did not want to do so. I told him that we really can't do anything turns of the biopsy until his blood pressure is under control. Hopefully that  can be accomplished over the next couple of weeks.  Tobacco use- Smoking cessation instruction/counseling given:  counseled patient on the dangers of tobacco use, advised patient to stop smoking, and reviewed strategies to maximize success.  Plan:  Quit smoking  Electromagnetic navigational bronchoscopy and endobronchial ultrasound with possible mediastinoscopy tentatively on 05/28/2016 pending blood pressure control  [name], MD [facility] [phone]

## 2016-05-12 NOTE — Telephone Encounter (Signed)
Blood pressure was 159/81 on 04-17-16 visit , 174/78 on 03-17-2016 and 136/79 on 02-01-2016 visits.  Advise if patient needs to be seen .

## 2016-05-13 ENCOUNTER — Ambulatory Visit (INDEPENDENT_AMBULATORY_CARE_PROVIDER_SITE_OTHER): Payer: Medicare Other | Admitting: Family Medicine

## 2016-05-13 ENCOUNTER — Encounter: Payer: Self-pay | Admitting: Family Medicine

## 2016-05-13 VITALS — BP 170/78 | HR 80 | Temp 97.9°F | Ht 67.0 in | Wt 187.0 lb

## 2016-05-13 DIAGNOSIS — F411 Generalized anxiety disorder: Secondary | ICD-10-CM

## 2016-05-13 DIAGNOSIS — I10 Essential (primary) hypertension: Secondary | ICD-10-CM | POA: Diagnosis not present

## 2016-05-13 DIAGNOSIS — R918 Other nonspecific abnormal finding of lung field: Secondary | ICD-10-CM | POA: Diagnosis not present

## 2016-05-13 DIAGNOSIS — I2510 Atherosclerotic heart disease of native coronary artery without angina pectoris: Secondary | ICD-10-CM

## 2016-05-13 MED ORDER — METOPROLOL SUCCINATE ER 50 MG PO TB24: 50.0000 mg | ORAL_TABLET | Freq: Every day | ORAL | 3 refills | Status: DC

## 2016-05-13 MED ORDER — LORAZEPAM 0.5 MG PO TABS: 0.5000 mg | ORAL_TABLET | Freq: Two times a day (BID) | ORAL | 1 refills | Status: DC | PRN

## 2016-05-13 NOTE — Progress Notes (Signed)
   HPI  Patient presents today here with hypertension.  Patient has a recent lung mass finding which is likely lung cancer. One day ago at thoracic surgery his blood pressure was found to be 220/104.  Patient denies any headache, chest pain, dyspnea, palpitations.  He was taking amlodipine but stopped because it was causing nausea.  Taking one half of his irbesartan/HCTZ.   PMH: Smoking status noted ROS: Per HPI  Objective: BP (!) 170/78   Pulse 80   Temp 97.9 F (36.6 C) (Oral)   Ht '5\' 7"'$  (1.702 m)   Wt 187 lb (84.8 kg)   BMI 29.29 kg/m  Gen: NAD, alert, cooperative with exam HEENT: NCAT CV: RRR, good S1/S2, no murmur Resp: CTABL, no wheezes, non-labored Ext: No edema, warm Neuro: Alert and oriented, No gross deficits  Assessment and plan:  # Hypertension Very elevated one day ago, likely some worsening of that was from anxiety. Adding Ativan for situational anxiety Increase irbesartan/HCTZ to 1 full pill daily, in 3 days if not at goal of 140/90 will add Toprol-XL 50 mg Follow-up early next week   # Anxiety state Understandable anxiety given recent lung cancer diagnosis Starting Ativan as needed We discussed SSRIs if he has daily need.  # Lung mass Likely lung cancer, patient is tentatively scheduled for February 21 for surgery Patient needs a pressure adjusted to undergo surgery    Meds ordered this encounter  Medications  . metoprolol succinate (TOPROL-XL) 50 MG 24 hr tablet    Sig: Take 1 tablet (50 mg total) by mouth daily. Take with or immediately following a meal.    Dispense:  90 tablet    Refill:  3  . LORazepam (ATIVAN) 0.5 MG tablet    Sig: Take 1 tablet (0.5 mg total) by mouth 2 (two) times daily as needed for anxiety.    Dispense:  30 tablet    Refill:  [place], MD [facility] 05/13/2016, 8:16 AM

## 2016-05-13 NOTE — Patient Instructions (Addendum)
Great to see you!  Start taking 1 whole pill of irbesartan/HCTZ  On Friday start metoprolol, 1 pill once daily  Come back Monday for follow up high blood pressure.   Ativan is only as needed, do not drive after you take it.

## 2016-05-14 ENCOUNTER — Ambulatory Visit (HOSPITAL_COMMUNITY)
Admission: RE | Admit: 2016-05-14 | Discharge: 2016-05-14 | Disposition: A | Discharge status: Home / Self Care | Payer: Medicare Other | Source: Ambulatory Visit | Attending: Family Medicine | Admitting: Family Medicine

## 2016-05-14 DIAGNOSIS — I501 Left ventricular failure: Secondary | ICD-10-CM | POA: Diagnosis not present

## 2016-05-14 DIAGNOSIS — I340 Nonrheumatic mitral (valve) insufficiency: Secondary | ICD-10-CM | POA: Diagnosis not present

## 2016-05-14 DIAGNOSIS — R9439 Abnormal result of other cardiovascular function study: Secondary | ICD-10-CM | POA: Diagnosis not present

## 2016-05-14 DIAGNOSIS — I3139 Other pericardial effusion (noninflammatory): Secondary | ICD-10-CM

## 2016-05-14 DIAGNOSIS — I358 Other nonrheumatic aortic valve disorders: Secondary | ICD-10-CM | POA: Diagnosis not present

## 2016-05-14 DIAGNOSIS — I313 Pericardial effusion (noninflammatory): Secondary | ICD-10-CM

## 2016-05-14 NOTE — Progress Notes (Signed)
*  PRELIMINARY RESULTS* Echocardiogram 2D Echocardiogram has been performed.  Leavy Cella 05/14/2016, 11:26 AM

## 2016-05-19 ENCOUNTER — Encounter: Payer: Self-pay | Admitting: Family Medicine

## 2016-05-19 ENCOUNTER — Ambulatory Visit (INDEPENDENT_AMBULATORY_CARE_PROVIDER_SITE_OTHER): Payer: Medicare Other | Admitting: Family Medicine

## 2016-05-19 VITALS — BP 159/76 | HR 84 | Temp 97.1°F | Ht 67.0 in | Wt 184.2 lb

## 2016-05-19 DIAGNOSIS — I2510 Atherosclerotic heart disease of native coronary artery without angina pectoris: Secondary | ICD-10-CM | POA: Diagnosis not present

## 2016-05-19 DIAGNOSIS — R7309 Other abnormal glucose: Secondary | ICD-10-CM | POA: Diagnosis not present

## 2016-05-19 DIAGNOSIS — I10 Essential (primary) hypertension: Secondary | ICD-10-CM

## 2016-05-19 MED ORDER — CLONIDINE HCL 0.1 MG PO TABS: 0.1000 mg | ORAL_TABLET | Freq: Three times a day (TID) | ORAL | 3 refills | Status: DC

## 2016-05-19 NOTE — Patient Instructions (Signed)
Great to see you!  If your blood pressure is not consistently in the 130s or 140s on Wednesday or Thursday start taking clonidine 1 pill three times daily.   On Friday if it is still not 952W or 413K systolic start 2 pills 3 times daily.   Call Monday after your pre-op exam to see how you are doing.

## 2016-05-19 NOTE — Progress Notes (Signed)
   HPI  Patient presents today to follow-up for hypertension.  Patient is very motivated to control his hypertension due to needing surgery for a lung mass next week.  Patient feels well. Amlodipine also nausea, new medications have not. He did not take metoprolol today.  Patient kept a blood pressure log, these of been averaging 443X systolic, on Sunday night he had one reading of 540G systolic. No dizziness Did not take metop today due to not eating, he was worried it would cause u;pset stomach.   He has cut back on ciggarettes  PMH: Smoking status noted ROS: Per HPI  Objective: BP (!) 159/76   Pulse 84   Temp 97.1 F (36.2 C) (Oral)   Ht '5\' 7"'$  (1.702 m)   Wt 184 lb 3.2 oz (83.6 kg)   BMI 28.85 kg/m  Gen: NAD, alert, cooperative with exam HEENT: NCAT CV: RRR, good S1/S2, no murmur Resp: CTABL, no wheezes, non-labored Ext: No edema, warm Neuro: Alert and oriented, No gross deficits  Assessment and plan:  # HTN Improving Continue irbesartan/HCTZ, metoprolol In 2-3 days if blood pressure is not at goal of 867Y or 195K systolic and add clonidine 1 pill 3 times daily, increase to 2 pills 3 times daily on Saturday if needed to reach goal Call after pre-op on Monday with BP  # Elevated glucose New problem To 150s on 1/18 Checking bmp and A1C Also overweight   Meds ordered this encounter  Medications  . cloNIDine (CATAPRES) 0.1 MG tablet    Sig: Take 1 tablet (0.1 mg total) by mouth 3 (three) times daily.    Dispense:  90 tablet    Refill:  [place], MD [facility] 05/19/2016, 8:59 AM

## 2016-05-21 LAB — BMP8+EGFR
BUN/Creatinine Ratio: 26 — ABNORMAL HIGH (ref 10–24)
BUN: 23 mg/dL (ref 8–27)
CALCIUM: 9.4 mg/dL (ref 8.6–10.2)
CO2: 29 mmol/L (ref 18–29)
CREATININE: 0.9 mg/dL (ref 0.76–1.27)
Chloride: 97 mmol/L (ref 96–106)
GFR, EST AFRICAN AMERICAN: 100 mL/min/{1.73_m2} (ref >59)
GFR, EST NON AFRICAN AMERICAN: 86 mL/min/{1.73_m2} (ref >59)
Glucose: 179 mg/dL — ABNORMAL HIGH (ref 65–99)
POTASSIUM: 4.6 mmol/L (ref 3.5–5.2)
Sodium: 139 mmol/L (ref 134–144)

## 2016-05-21 LAB — HEMOGLOBIN A1C
ESTIMATED AVERAGE GLUCOSE: 169 mg/dL
Hgb A1c MFr Bld: 7.5 % — ABNORMAL HIGH (ref 4.8–5.6)

## 2016-05-22 ENCOUNTER — Telehealth: Payer: Self-pay | Admitting: *Deleted

## 2016-05-22 NOTE — Telephone Encounter (Signed)
Notes Recorded by Laurine Blazer, LPN on 4/73/4037 at 0:96 AM EST Left message to return call.  ------  Notes Recorded by Herminio Commons, MD on 05/19/2016 at 4:39 PM EST Normal pumping function with small to moderate fluid collection around the heart. Repeat limited echo to reassess pericardial effusion in 3 months.

## 2016-05-23 ENCOUNTER — Encounter (HOSPITAL_COMMUNITY): Payer: Medicare Other | Admitting: Oncology

## 2016-05-23 NOTE — Pre-Procedure Instructions (Signed)
John [name]  05/23/2016      [id number] [place], [place] [address] [address]Phone: [phone]: [phone]   Your procedure is scheduled Wed, Feb 21 @ 4:25 PM  [facility]  Call this number if you have problems the morning of surgery:  [phone]  Remember:  Do not eat food or drink liquids after midnight.  Take these medicines the morning of surgery with A SIP OF WATER Clonidine(Catapres),Proscar(Finasteride),Flonase(Fluticasone),GabapentinI(Neurontin),Ativan (Lorazepam),and Flomax(Tamsulosin)               Stop taking your Mobic and Aspirin along with Vitamins or Herbal Medications. No Goody's,BC's,Aleve,Advil,Motrin,or Fish Oil.    Do not wear jewelry.  Do not wear lotions, powders,colognes.  Men may shave face and neck.  Do not bring valuables to the hospital.  [facility] belongings or [facility]ables.  Contacts, dentures or bridgework may not be worn into surgery.  Leave your suitcase in the car.  After surgery it may be brought to your room.  For patients admitted to the hospital, discharge time will be determined by your treatment team.  Patients discharged the day of surgery will not be allowed to drive home.    [facility]  Before surgery, you can play an important role.  Because skin is not sterile, your skin needs to be as free of germs as possible.  You can reduce the number of germs on you skin by washing with CHG (chlorahexidine gluconate) soap before surgery.  CHG is an antiseptic cleaner which kills germs and bonds with the skin to continue killing germs even after washing.  Please DO NOT use if you have an allergy to CHG or antibacterial soaps.  If your skin becomes reddened/irritated stop using the CHG and inform your nurse when you arrive at Short Stay.  Do not shave (including legs  and underarms) for at least 48 hours prior to the first CHG shower.  You may shave your face.  Please follow these instructions carefully:   1.  Shower with CHG Soap the night before surgery and the                                morning of Surgery.  2.  If you choose to wash your hair, wash your hair first as usual with your       normal shampoo.  3.  After you shampoo, rinse your hair and body thoroughly to remove the                      Shampoo.  4.  Use CHG as you would any other liquid soap.  You can apply chg directly       to the skin and wash gently with scrungie or a clean washcloth.  5.  Apply the CHG Soap to your body ONLY FROM THE NECK DOWN.        Do not use on open wounds or open sores.  Avoid contact with your eyes,       ears, mouth and genitals (private parts).  Wash genitals (private parts)       with your normal soap.  6.  Wash thoroughly, paying special attention to the area where your surgery  will be performed.  7.  Thoroughly rinse your body with warm water from the neck down.  8.  DO NOT shower/wash with your normal soap after using and rinsing off       the CHG Soap.  9.  Pat yourself dry with a clean towel.            10.  Wear clean pajamas.            11.  Place clean sheets on your bed the night of your first shower and do not        sleep with pets.  Day of Surgery  Do not apply any lotions/deoderants the morning of surgery.  Please wear clean clothes to the hospital/surgery center.   Please read over the following fact sheets that you were given. Pain Booklet

## 2016-05-26 ENCOUNTER — Encounter (HOSPITAL_COMMUNITY): Payer: Self-pay

## 2016-05-26 ENCOUNTER — Encounter (HOSPITAL_COMMUNITY)
Admission: RE | Admit: 2016-05-26 | Discharge: 2016-05-26 | Disposition: A | Discharge status: Home / Self Care | Payer: Medicare Other | Source: Ambulatory Visit | Attending: Thoracic Surgery (Cardiothoracic Vascular Surgery) | Admitting: Thoracic Surgery (Cardiothoracic Vascular Surgery)

## 2016-05-26 ENCOUNTER — Ambulatory Visit (HOSPITAL_COMMUNITY)
Admission: RE | Admit: 2016-05-26 | Discharge: 2016-05-26 | Disposition: A | Discharge status: Home / Self Care | Payer: Medicare Other | Source: Ambulatory Visit | Attending: Thoracic Surgery (Cardiothoracic Vascular Surgery) | Admitting: Thoracic Surgery (Cardiothoracic Vascular Surgery)

## 2016-05-26 DIAGNOSIS — Z01818 Encounter for other preprocedural examination: Secondary | ICD-10-CM | POA: Diagnosis not present

## 2016-05-26 DIAGNOSIS — R590 Localized enlarged lymph nodes: Secondary | ICD-10-CM | POA: Diagnosis present

## 2016-05-26 DIAGNOSIS — R918 Other nonspecific abnormal finding of lung field: Secondary | ICD-10-CM

## 2016-05-26 DIAGNOSIS — J439 Emphysema, unspecified: Secondary | ICD-10-CM | POA: Diagnosis not present

## 2016-05-26 HISTORY — DX: Essential (primary) hypertension: I10

## 2016-05-26 HISTORY — DX: Dyspnea, unspecified: R06.00

## 2016-05-26 HISTORY — DX: Unspecified osteoarthritis, unspecified site: M19.90

## 2016-05-26 HISTORY — DX: Depression, unspecified: F32.A

## 2016-05-26 HISTORY — DX: Sleep apnea, unspecified: G47.30

## 2016-05-26 HISTORY — DX: Major depressive disorder, single episode, unspecified: F32.9

## 2016-05-26 HISTORY — DX: Anxiety disorder, unspecified: F41.9

## 2016-05-26 LAB — COMPREHENSIVE METABOLIC PANEL
ALBUMIN: 3.4 g/dL — AB (ref 3.5–5.0)
ALK PHOS: 90 U/L (ref 38–126)
ALT: 15 U/L — ABNORMAL LOW (ref 17–63)
ANION GAP: 9 (ref 5–15)
AST: 17 U/L (ref 15–41)
BUN: 15 mg/dL (ref 6–20)
CALCIUM: 9.2 mg/dL (ref 8.9–10.3)
CHLORIDE: 101 mmol/L (ref 101–111)
CO2: 28 mmol/L (ref 22–32)
Creatinine, Ser: 0.91 mg/dL (ref 0.61–1.24)
GFR calc Af Amer: >60 mL/min (ref >60)
GFR calc non Af Amer: >60 mL/min (ref >60)
GLUCOSE: 165 mg/dL — AB (ref 65–99)
POTASSIUM: 4.1 mmol/L (ref 3.5–5.1)
SODIUM: 138 mmol/L (ref 135–145)
Total Bilirubin: 0.4 mg/dL (ref 0.3–1.2)
Total Protein: 7 g/dL (ref 6.5–8.1)

## 2016-05-26 LAB — CBC
HCT: 40.9 % (ref 39.0–52.0)
HEMOGLOBIN: 14 g/dL (ref 13.0–17.0)
MCH: 31.9 pg (ref 26.0–34.0)
MCHC: 34.2 g/dL (ref 30.0–36.0)
MCV: 93.2 fL (ref 78.0–100.0)
PLATELETS: 297 10*3/uL (ref 150–400)
RBC: 4.39 MIL/uL (ref 4.22–5.81)
RDW: 13.8 % (ref 11.5–15.5)
WBC: 8.5 10*3/uL (ref 4.0–10.5)

## 2016-05-26 LAB — TYPE AND SCREEN
ABO/RH(D): O POS
ANTIBODY SCREEN: NEGATIVE

## 2016-05-26 LAB — SURGICAL PCR SCREEN
MRSA, PCR: NEGATIVE
STAPHYLOCOCCUS AUREUS: NEGATIVE

## 2016-05-26 LAB — ABO/RH: ABO/RH(D): O POS

## 2016-05-26 LAB — APTT: APTT: 31 s (ref 24–36)

## 2016-05-26 LAB — PROTIME-INR
INR: 0.96
Prothrombin Time: 12.8 seconds (ref 11.4–15.2)

## 2016-05-26 MED ORDER — CHLORHEXIDINE GLUCONATE 4 % EX LIQD: 60.0000 mL | Freq: Once | CUTANEOUS | Status: DC

## 2016-05-27 LAB — GLUCOSE, CAPILLARY: Glucose-Capillary: 178 mg/dL — ABNORMAL HIGH (ref 65–99)

## 2016-05-27 MED ORDER — CEFUROXIME SODIUM 1.5 G IJ SOLR
1.5000 g | INTRAMUSCULAR | Status: AC
  Administered 2016-05-28: 1.5 g via INTRAVENOUS
  Filled 2016-05-27: qty 1.5

## 2016-05-27 NOTE — Telephone Encounter (Signed)
Notes Recorded by Laurine Blazer, LPN on 06/14/4074 at 80:88 AM EST Patient notified and verbalized understanding. Copy to pmd. Follow up with Dr. Bronson Ing scheduled for 06/03/2016.

## 2016-05-27 NOTE — Progress Notes (Signed)
Anesthesia chart review: Patient is a 71 year old male scheduled for video bronchoscopy with endobronchial navigation and endobronchial ultrasound, possible mediastinoscopy on 05/28/2016 by Dr. [name].   History includes smoking, lung mass, HTN, HLD, DM2 (diet controlled), COPD, OSA (CPAP), prostatitis, anxiety, depression, dyspnea, arthritis.   PCP is Dr. [name]. Last visit 05/19/16. He is aware of surgery plans.  Cardiologist is Dr. Bronson Ing. HEM-ONC is Dr. Whitney Muse.  Meds include aspirin 81 mg, Catapres, Proscar, Flonase, Neurontin, Avalide, Ativan, Toprol-XL, pravastatin, Flomax, Incruse Ellipta.  BP (!) 151/67   Pulse 81   Temp 36.7 C   Resp 18   Ht '5\' 7"'$  (1.702 m)   Wt 187 lb 11.2 oz (85.1 kg)   SpO2 97%   BMI 29.40 kg/m   EKG 05/26/16: Normal sinus rhythm, T-wave abnormality, consider lateral ischemia. Lateral T wave inversion is more apparent in V4-5, otherwise stable when compared to 12/11/7 tracing.  Echo 05/05/16: Study Conclusions - Left ventricle: The cavity size was normal. Wall thickness was   increased in a pattern of mild LVH. Systolic function was   vigorous. The estimated ejection fraction was in the range of 65%   to 70%. Wall motion was normal; there were no regional wall   motion abnormalities. Doppler parameters are consistent with   abnormal left ventricular relaxation (grade 1 diastolic   dysfunction). - Aortic valve: Mildly calcified annulus. Trileaflet; mildly   calcified leaflets. - Mitral valve: Calcified annulus. There was trivial regurgitation. - Left atrium: The atrium was at the upper limits of normal in   size. - Right atrium: Central venous pressure (est): 3 mm Hg. - Tricuspid valve: There was trivial regurgitation. - Pulmonary arteries: Systolic pressure could not be accurately   estimated. - Pericardium, extracardiac: A moderate pericardial effusion was   identified circumferential to the heart with evidence of   organization  anteriorly. There is diastolic compression of the   right atrial free wall, although no obvious right ventricular   compromise, and the mitral inflow pattern with respiration is not   clearly diagnostic for tamponade physiology. The hemodynamic   significance of this pericardial effusion may be in evolution and   would suggest close clinical follow-up. Impressions: - Mild LVH with LVEF 65-70% and probable grade 1 diastolic   dysfunction. Calcified mitral annulus with trivial mitral   regurgitation. Trivial tricuspid regurgitation. A moderate   pericardial effusion was identified circumferential to the heart   with evidence of organization anteriorly. There is diastolic   compression of the right atrial free wall, although no obvious   right ventricular compromise, and the mitral inflow pattern with   respiration is not clearly diagnostic for tamponade physiology.   The hemodynamic significance of this pericardial effusion may be   in evolution and would suggest close clinical follow-up. (Per Dr. Bronson Ing, "Please repeat a LIMITED echo to reassess pericardial effusion in one week.")  Limited Echo 05/14/16: Impressions: - Limited study. Moderate LVH with LVEF 54-00% grade 1 diastolic   dysfunction. Small to moderate, circumferential pericardial   effusion with evidence of organization anteriorly. No clear   evidence of tamponade physiology with normal respiratory mitral   inflow pattern and no right ventricular compression. IVC is not   dilated. (Per Dr. Bronson Ing, "Repeat limited echo to reassess pericardial effusion in 3 months.)  Nuclear stress test 05/05/16:  No diagnostic ST segment changes to indicate ischemia.  Small, mild intensity, partially reversible inferior defect suggesting small ischemic territory.  This is a low risk  study.  Nuclear stress EF: 61%. (Per Dr. Bronson Ing, "Low risk, will manage medically.")  CXR 05/26/16: IMPRESSION: 1. No acute finding. 2.  Emphysema and bilateral lung nodules.  PET scan 04/24/16: IMPRESSION: 1. Adjacent hypermetabolic nodules in the RIGHT upper lobe consistent bronchogenic carcinoma. 2. Suspicion of ipsilateral hilar and paratracheal nodal metastasis. 3. Nodule in the LEFT upper lobe with suspicious morphology has a relatively low metabolic activity for size. Favor synchronous primary bronchogenic carcinoma. 4. No metabolic activity associated with a small LEFT lower lobe pulmonary nodule. Recommend attention on follow-up.  Preoperative labs noted. A1c on 05/19/2016 was 7.5.  Patient with recent cardiology testing. He does have a small to moderate size pericardial effusion with planned follow-up in 3 month. HTN is better controlled since his visit with Dr. [name] and subsequent follow-up with Dr. Wendi Snipes. Further evaluation by his anesthesiologist on the day of surgery but if no acute cardiopulmonary issues and I would anticipate that he could proceed as planned.  George Hugh [facility]/Anesthesiology Phone [phone] 05/27/2016 10:52 AM

## 2016-05-28 ENCOUNTER — Ambulatory Visit (HOSPITAL_COMMUNITY): Payer: Medicare Other | Admitting: Vascular Surgery

## 2016-05-28 ENCOUNTER — Ambulatory Visit (HOSPITAL_COMMUNITY): Payer: Medicare Other

## 2016-05-28 ENCOUNTER — Ambulatory Visit (HOSPITAL_COMMUNITY): Payer: Medicare Other | Admitting: Anesthesiology

## 2016-05-28 ENCOUNTER — Ambulatory Visit (HOSPITAL_COMMUNITY)
Admission: RE | Admit: 2016-05-28 | Discharge: 2016-05-28 | Disposition: A | Discharge status: Home / Self Care | Payer: Medicare Other | Source: Ambulatory Visit | Attending: Thoracic Surgery (Cardiothoracic Vascular Surgery) | Admitting: Thoracic Surgery (Cardiothoracic Vascular Surgery)

## 2016-05-28 ENCOUNTER — Encounter (HOSPITAL_COMMUNITY)
Admission: RE | Disposition: A | Discharge status: Home / Self Care | Payer: Self-pay | Source: Ambulatory Visit | Attending: Thoracic Surgery (Cardiothoracic Vascular Surgery)

## 2016-05-28 DIAGNOSIS — E1142 Type 2 diabetes mellitus with diabetic polyneuropathy: Secondary | ICD-10-CM | POA: Insufficient documentation

## 2016-05-28 DIAGNOSIS — I2510 Atherosclerotic heart disease of native coronary artery without angina pectoris: Secondary | ICD-10-CM | POA: Insufficient documentation

## 2016-05-28 DIAGNOSIS — Z7982 Long term (current) use of aspirin: Secondary | ICD-10-CM | POA: Insufficient documentation

## 2016-05-28 DIAGNOSIS — R911 Solitary pulmonary nodule: Secondary | ICD-10-CM | POA: Diagnosis not present

## 2016-05-28 DIAGNOSIS — J439 Emphysema, unspecified: Secondary | ICD-10-CM | POA: Insufficient documentation

## 2016-05-28 DIAGNOSIS — R918 Other nonspecific abnormal finding of lung field: Secondary | ICD-10-CM | POA: Insufficient documentation

## 2016-05-28 DIAGNOSIS — E785 Hyperlipidemia, unspecified: Secondary | ICD-10-CM | POA: Insufficient documentation

## 2016-05-28 DIAGNOSIS — F17210 Nicotine dependence, cigarettes, uncomplicated: Secondary | ICD-10-CM | POA: Diagnosis not present

## 2016-05-28 DIAGNOSIS — Z419 Encounter for procedure for purposes other than remedying health state, unspecified: Secondary | ICD-10-CM

## 2016-05-28 DIAGNOSIS — J449 Chronic obstructive pulmonary disease, unspecified: Secondary | ICD-10-CM | POA: Diagnosis not present

## 2016-05-28 DIAGNOSIS — F419 Anxiety disorder, unspecified: Secondary | ICD-10-CM | POA: Insufficient documentation

## 2016-05-28 DIAGNOSIS — I700 Atherosclerosis of aorta: Secondary | ICD-10-CM | POA: Diagnosis not present

## 2016-05-28 DIAGNOSIS — F329 Major depressive disorder, single episode, unspecified: Secondary | ICD-10-CM | POA: Diagnosis not present

## 2016-05-28 DIAGNOSIS — R590 Localized enlarged lymph nodes: Secondary | ICD-10-CM | POA: Diagnosis not present

## 2016-05-28 DIAGNOSIS — I10 Essential (primary) hypertension: Secondary | ICD-10-CM | POA: Insufficient documentation

## 2016-05-28 DIAGNOSIS — C3411 Malignant neoplasm of upper lobe, right bronchus or lung: Secondary | ICD-10-CM | POA: Diagnosis not present

## 2016-05-28 HISTORY — PX: VIDEO BRONCHOSCOPY WITH ENDOBRONCHIAL ULTRASOUND: SHX6177

## 2016-05-28 HISTORY — PX: VIDEO BRONCHOSCOPY WITH ENDOBRONCHIAL NAVIGATION: SHX6175

## 2016-05-28 LAB — GLUCOSE, CAPILLARY
GLUCOSE-CAPILLARY: 123 mg/dL — AB (ref 65–99)
Glucose-Capillary: 159 mg/dL — ABNORMAL HIGH (ref 65–99)

## 2016-05-28 SURGERY — VIDEO BRONCHOSCOPY WITH ENDOBRONCHIAL NAVIGATION
Anesthesia: General

## 2016-05-28 MED ORDER — FENTANYL CITRATE (PF) 250 MCG/5ML IJ SOLN
INTRAMUSCULAR | Status: AC
  Filled 2016-05-28: qty 5

## 2016-05-28 MED ORDER — ONDANSETRON HCL 4 MG/2ML IJ SOLN
INTRAMUSCULAR | Status: DC | PRN
  Administered 2016-05-28: 4 mg via INTRAVENOUS

## 2016-05-28 MED ORDER — LACTATED RINGERS IV SOLN
INTRAVENOUS | Status: DC
  Administered 2016-05-28 (×3): via INTRAVENOUS

## 2016-05-28 MED ORDER — PROPOFOL 10 MG/ML IV BOLUS
INTRAVENOUS | Status: AC
  Filled 2016-05-28: qty 20

## 2016-05-28 MED ORDER — MIDAZOLAM HCL 5 MG/5ML IJ SOLN
INTRAMUSCULAR | Status: DC | PRN
  Administered 2016-05-28 (×2): 1 mg via INTRAVENOUS

## 2016-05-28 MED ORDER — ROCURONIUM BROMIDE 100 MG/10ML IV SOLN
INTRAVENOUS | Status: DC | PRN
Start: 2016-05-28 — End: 2016-05-28
  Administered 2016-05-28: 50 mg via INTRAVENOUS

## 2016-05-28 MED ORDER — ALBUTEROL SULFATE (2.5 MG/3ML) 0.083% IN NEBU
2.5000 mg | INHALATION_SOLUTION | Freq: Four times a day (QID) | RESPIRATORY_TRACT | Status: DC | PRN
Start: 2016-05-28 — End: 2016-05-28

## 2016-05-28 MED ORDER — LIDOCAINE HCL (CARDIAC) 20 MG/ML IV SOLN
INTRAVENOUS | Status: DC | PRN
  Administered 2016-05-28: 100 mg via INTRAVENOUS

## 2016-05-28 MED ORDER — FENTANYL CITRATE (PF) 100 MCG/2ML IJ SOLN
INTRAMUSCULAR | Status: DC | PRN
  Administered 2016-05-28: 50 ug via INTRAVENOUS

## 2016-05-28 MED ORDER — SUGAMMADEX SODIUM 200 MG/2ML IV SOLN
INTRAVENOUS | Status: AC
  Filled 2016-05-28: qty 2

## 2016-05-28 MED ORDER — MIDAZOLAM HCL 2 MG/2ML IJ SOLN
INTRAMUSCULAR | Status: AC
  Filled 2016-05-28: qty 2

## 2016-05-28 MED ORDER — 0.9 % SODIUM CHLORIDE (POUR BTL) OPTIME
TOPICAL | Status: DC | PRN
  Administered 2016-05-28: 1000 mL

## 2016-05-28 MED ORDER — ONDANSETRON HCL 4 MG/2ML IJ SOLN
INTRAMUSCULAR | Status: AC
  Filled 2016-05-28: qty 2

## 2016-05-28 MED ORDER — HYDROMORPHONE HCL 1 MG/ML IJ SOLN: 0.2500 mg | INTRAMUSCULAR | Status: DC | PRN

## 2016-05-28 MED ORDER — PROPOFOL 10 MG/ML IV BOLUS
INTRAVENOUS | Status: DC | PRN
  Administered 2016-05-28: 200 mg via INTRAVENOUS

## 2016-05-28 MED ORDER — ROCURONIUM BROMIDE 50 MG/5ML IV SOSY
PREFILLED_SYRINGE | INTRAVENOUS | Status: AC
  Filled 2016-05-28: qty 5

## 2016-05-28 MED ORDER — PHENYLEPHRINE HCL 10 MG/ML IJ SOLN
INTRAMUSCULAR | Status: DC | PRN
  Administered 2016-05-28: 40 ug/min via INTRAVENOUS

## 2016-05-28 MED ORDER — PROMETHAZINE HCL 25 MG/ML IJ SOLN: 6.2500 mg | INTRAMUSCULAR | Status: DC | PRN

## 2016-05-28 MED ORDER — SCOPOLAMINE 1 MG/3DAYS TD PT72
MEDICATED_PATCH | TRANSDERMAL | Status: DC | PRN
  Administered 2016-05-28: 1 via TRANSDERMAL

## 2016-05-28 MED ORDER — ALBUTEROL SULFATE (2.5 MG/3ML) 0.083% IN NEBU
INHALATION_SOLUTION | RESPIRATORY_TRACT | Status: AC
  Administered 2016-05-28: 2.5 mg
  Filled 2016-05-28: qty 3

## 2016-05-28 MED ORDER — SUGAMMADEX SODIUM 200 MG/2ML IV SOLN
INTRAVENOUS | Status: DC | PRN
  Administered 2016-05-28: 200 mg via INTRAVENOUS

## 2016-05-28 MED ORDER — LIDOCAINE 2% (20 MG/ML) 5 ML SYRINGE
INTRAMUSCULAR | Status: AC
  Filled 2016-05-28: qty 10

## 2016-05-28 MED ORDER — SCOPOLAMINE 1 MG/3DAYS TD PT72
MEDICATED_PATCH | TRANSDERMAL | Status: AC
  Filled 2016-05-28: qty 1

## 2016-05-28 MED ORDER — ARTIFICIAL TEARS OP OINT
TOPICAL_OINTMENT | OPHTHALMIC | Status: AC
  Filled 2016-05-28: qty 3.5

## 2016-05-28 SURGICAL SUPPLY — 80 items
ADAPTER BRONCH F/PENTAX (ADAPTER) ×6 IMPLANT
APPLIER CLIP LOGIC TI 5 (MISCELLANEOUS) IMPLANT
BLADE SURG 15 STRL LF DISP TIS (BLADE) ×1 IMPLANT
BLADE SURG 15 STRL SS (BLADE) ×2
BRUSH CYTOL CELLEBRITY 1.5X140 (MISCELLANEOUS) IMPLANT
BRUSH SUPERTRAX BIOPSY (INSTRUMENTS) ×3 IMPLANT
BRUSH SUPERTRAX NDL-TIP CYTO (INSTRUMENTS) ×6 IMPLANT
CANISTER SUCTION 2500CC (MISCELLANEOUS) ×6 IMPLANT
CHANNEL WORK EXTEND EDGE 180 (KITS) IMPLANT
CHANNEL WORK EXTEND EDGE 45 (KITS) IMPLANT
CHANNEL WORK EXTEND EDGE 90 (KITS) IMPLANT
CLIP TI MEDIUM 6 (CLIP) IMPLANT
CONT SPEC 4OZ CLIKSEAL STRL BL (MISCELLANEOUS) ×9 IMPLANT
COTTONBALL LRG STERILE PKG (GAUZE/BANDAGES/DRESSINGS) IMPLANT
COVER DOME SNAP 22 D (MISCELLANEOUS) ×3 IMPLANT
COVER SURGICAL LIGHT HANDLE (MISCELLANEOUS) ×6 IMPLANT
COVER TABLE BACK 60X90 (DRAPES) ×6 IMPLANT
DERMABOND ADVANCED (GAUZE/BANDAGES/DRESSINGS) ×2
DERMABOND ADVANCED .7 DNX12 (GAUZE/BANDAGES/DRESSINGS) ×1 IMPLANT
DRAPE CHEST BREAST 15X10 FENES (DRAPES) ×3 IMPLANT
ELECT REM PT RETURN 9FT ADLT (ELECTROSURGICAL) ×3
ELECTRODE REM PT RTRN 9FT ADLT (ELECTROSURGICAL) ×1 IMPLANT
FILTER STRAW FLUID ASPIR (MISCELLANEOUS) IMPLANT
FORCEPS BIOP RJ4 1.8 (CUTTING FORCEPS) IMPLANT
FORCEPS BIOP SUPERTRX PREMAR (INSTRUMENTS) ×3 IMPLANT
GAUZE SPONGE 4X4 12PLY STRL (GAUZE/BANDAGES/DRESSINGS) ×3 IMPLANT
GAUZE SPONGE 4X4 16PLY XRAY LF (GAUZE/BANDAGES/DRESSINGS) ×3 IMPLANT
GLOVE SURG SIGNA 7.5 PF LTX (GLOVE) ×6 IMPLANT
GOWN STRL REUS W/ TWL LRG LVL3 (GOWN DISPOSABLE) ×1 IMPLANT
GOWN STRL REUS W/ TWL XL LVL3 (GOWN DISPOSABLE) ×2 IMPLANT
GOWN STRL REUS W/TWL LRG LVL3 (GOWN DISPOSABLE) ×2
GOWN STRL REUS W/TWL XL LVL3 (GOWN DISPOSABLE) ×4
HEMOSTAT SURGICEL 2X14 (HEMOSTASIS) IMPLANT
KIT BASIN OR (CUSTOM PROCEDURE TRAY) ×3 IMPLANT
KIT CLEAN ENDO COMPLIANCE (KITS) ×9 IMPLANT
KIT PROCEDURE EDGE 180 (KITS) ×3 IMPLANT
KIT PROCEDURE EDGE 45 (KITS) IMPLANT
KIT PROCEDURE EDGE 90 (KITS) IMPLANT
KIT ROOM TURNOVER OR (KITS) ×6 IMPLANT
MARKER SKIN DUAL TIP RULER LAB (MISCELLANEOUS) ×6 IMPLANT
NEEDLE 22X1 1/2 (OR ONLY) (NEEDLE) IMPLANT
NEEDLE BIOPSY TRANSBRONCH 21G (NEEDLE) IMPLANT
NEEDLE BLUNT 18X1 FOR OR ONLY (NEEDLE) IMPLANT
NEEDLE EBUS SONO TIP PENTAX (NEEDLE) ×3 IMPLANT
NEEDLE SUPERTRX PREMARK BIOPSY (NEEDLE) ×6 IMPLANT
NS IRRIG 1000ML POUR BTL (IV SOLUTION) ×6 IMPLANT
OIL SILICONE PENTAX (PARTS (SERVICE/REPAIRS)) ×6 IMPLANT
PACK SURGICAL SETUP 50X90 (CUSTOM PROCEDURE TRAY) ×3 IMPLANT
PAD ARMBOARD 7.5X6 YLW CONV (MISCELLANEOUS) ×12 IMPLANT
PATCHES PATIENT (LABEL) ×9 IMPLANT
PEN SKIN MARKING BROAD (MISCELLANEOUS) ×3 IMPLANT
PENCIL BUTTON HOLSTER BLD 10FT (ELECTRODE) ×3 IMPLANT
SPONGE INTESTINAL PEANUT (DISPOSABLE) IMPLANT
SUT SILK 2 0 (SUTURE)
SUT SILK 2-0 18XBRD TIE 12 (SUTURE) IMPLANT
SUT VIC AB 2-0 CT1 27 (SUTURE)
SUT VIC AB 2-0 CT1 TAPERPNT 27 (SUTURE) IMPLANT
SUT VIC AB 3-0 SH 18 (SUTURE) ×3 IMPLANT
SUT VICRYL 4-0 PS2 18IN ABS (SUTURE) ×3 IMPLANT
SWAB COLLECTION DEVICE MRSA (MISCELLANEOUS) IMPLANT
SYR 20CC LL (SYRINGE) ×9 IMPLANT
SYR 20ML ECCENTRIC (SYRINGE) ×12 IMPLANT
SYR 30ML LL (SYRINGE) ×3 IMPLANT
SYR 5ML LL (SYRINGE) ×6 IMPLANT
SYR 5ML LUER SLIP (SYRINGE) ×3 IMPLANT
SYR BULB 3OZ (MISCELLANEOUS) ×3 IMPLANT
SYR CONTROL 10ML LL (SYRINGE) IMPLANT
SYRINGE 10CC LL (SYRINGE) ×3 IMPLANT
TOWEL GREEN STERILE (TOWEL DISPOSABLE) ×4 IMPLANT
TOWEL GREEN STERILE FF (TOWEL DISPOSABLE) ×6 IMPLANT
TOWEL OR 17X24 6PK STRL BLUE (TOWEL DISPOSABLE) ×6 IMPLANT
TOWEL OR 17X26 10 PK STRL BLUE (TOWEL DISPOSABLE) ×3 IMPLANT
TRAP SPECIMEN MUCOUS 40CC (MISCELLANEOUS) ×6 IMPLANT
TUBE ANAEROBIC SPECIMEN COL (MISCELLANEOUS) IMPLANT
TUBE CONNECTING 12'X1/4 (SUCTIONS) ×1
TUBE CONNECTING 12X1/4 (SUCTIONS) ×2 IMPLANT
TUBE CONNECTING 20'X1/4 (TUBING) ×3
TUBE CONNECTING 20X1/4 (TUBING) ×6 IMPLANT
UNDERPAD 30X30 (UNDERPADS AND DIAPERS) ×3 IMPLANT
WATER STERILE IRR 1000ML POUR (IV SOLUTION) ×6 IMPLANT

## 2016-05-28 NOTE — Discharge Instructions (Signed)
Do not drive or engage in heavy physical activity for 24 hours  You may resume normal activities tomorrow  You may cough up small amounts of blood over the next few days  You may use Tylenol for discomfort and may use over the counter cough medication and/ or cough drops if needed  Call [phone] if you develop chest pain, shortness of breath, fever > 101 F or cough up more than 2 tablespoons of blood  Follow up at the [facility] as scheduled

## 2016-05-28 NOTE — Anesthesia Procedure Notes (Signed)
Procedure Name: Intubation Date/Time: 05/28/2016 2:56 PM Performed by: FLORES, ROBERT Pre-anesthesia Checklist: Patient identified, Emergency Drugs available, Suction available and Patient being monitored Patient Re-evaluated:Patient Re-evaluated prior to inductionOxygen Delivery Method: Circle system utilized Preoxygenation: Pre-oxygenation with 100% oxygen Intubation Type: IV induction Ventilation: Mask ventilation without difficulty Laryngoscope Size: Miller and 3 Grade View: Grade I Tube type: Oral Tube size: 8.5 mm Number of attempts: 1 Airway Equipment and Method: Stylet and LTA kit utilized Placement Confirmation: ETT inserted through vocal cords under direct vision,  positive ETCO2 and breath sounds checked- equal and bilateral Secured at: 23 cm Dental Injury: Teeth and Oropharynx as per pre-operative assessment        

## 2016-05-28 NOTE — Interval H&P Note (Signed)
History and Physical Interval Note:  05/28/2016 1:29 PM  John [name]  has presented today for surgery, with the diagnosis of [place]  The various methods of treatment have been discussed with the patient and family. After consideration of risks, benefits and other options for treatment, the patient has consented to  Procedure(s): VIDEO BRONCHOSCOPY WITH ENDOBRONCHIAL NAVIGATION (N/A) VIDEO BRONCHOSCOPY WITH ENDOBRONCHIAL ULTRASOUND (N/A) POSSIBLE MEDIASTINOSCOPY (N/A) as a surgical intervention .  The patient's history has been reviewed, patient examined, no change in status, stable for surgery.  I have reviewed the patient's chart and labs.  Questions were answered to the patient's satisfaction.     [name]

## 2016-05-28 NOTE — Brief Op Note (Signed)
05/28/2016  4:35 PM  PATIENT:  John [name]  71 y.o. male  PRE-OPERATIVE DIAGNOSIS:  BILATERAL LUNG NODULES MEDIASTINAL ADENOPATHY  POST-OPERATIVE DIAGNOSIS:  BILATERAL LUNG NODULES MEDIASTINAL ADENOPATHY-   PROCEDURE:  1.Electromagnetic navigational bronchoscopy with brushings, needle aspirations and biopsies of bilateral upper lobe nodules 2. Endobronchial ultrasound with mediastinal and hilar lymph node aspirations  SURGEON:  Surgeon(s) and Role:    * [name], MD - Primary  ASSISTANTS: none   ANESTHESIA:   general  EBL:  Total I/O In: 1500 [I.V.:1500] Out: 0   BLOOD ADMINISTERED:none  DRAINS: none   LOCAL MEDICATIONS USED:  NONE  SPECIMEN:  Source of Specimen:  4R, 7 and 11R lymph nodes, RUL and LUL nodules  DISPOSITION OF SPECIMEN:  PATHOLOGY  PLAN OF CARE: Discharge to home after PACU  PATIENT DISPOSITION:  PACU - hemodynamically stable.   Delay start of Pharmacological VTE agent (>24hrs) due to surgical blood loss or risk of bleeding: not applicable  Level 4 R lymph node aspirations + for malignancy

## 2016-05-28 NOTE — Op Note (Signed)
NAMEYASSIN, SCALES NO.:  [id number]  MEDICAL RECORD NO.:  [id number]  LOCATION:                                 FACILITY:  PHYSICIAN:  Revonda Standard. [name], M.D.DATE OF BIRTH:  [birth year]  DATE OF PROCEDURE:  05/28/2016 DATE OF DISCHARGE:                              OPERATIVE REPORT   PREOPERATIVE DIAGNOSIS:  Bilateral upper lobe lung nodules with mediastinal adenopathy and right hilar adenopathy.  POSTOPERATIVE DIAGNOSIS:  Bilateral upper lobe lung nodules with mediastinal adenopathy and right hilar adenopathy.  PROCEDURE: 1. Electromagnetic navigational bronchoscopy with brushings, needle     aspirations and biopsies of bilateral upper lobe nodules. 2. Endobronchial ultrasound with mediastinal and hilar lymph node     aspirations.  SURGEON:  Revonda Standard. [name], M.D.  ASSISTANT:  None.  ANESTHESIA:  General.  FINDINGS:  Level 4R node aspirations positive for malignancy.  Level 7 and 11 aspirations were negative.  CLINICAL NOTE:  Mr. Barco is a 71 year old man who recently presented with a chest cold.  Workup revealed a 3.8 cm bilobed mass in the right upper lobe and a 1.3 cm left upper lobe nodule.  The PET-CT showed both upper lobe nodules were hypermetabolic as were the right paratracheal lymph nodes.  He was advised to undergo navigational bronchoscopy to sample both upper lobe nodules as well as endobronchial ultrasound to sample the mediastinal and hilar lymph nodes.  The indications, risks, benefits, and alternatives were discussed in detail with the patient. He understood and accepted the risks and agreed to proceed.  OPERATIVE NOTE:  Mr. Vetsch was brought to the operating room on May 28, 2016.  He had induction of general anesthesia.  Sequential compression devices were placed for DVT prophylaxis.  A Bair Hugger was placed to maintain warmth.  Intravenous antibiotics were administered. The time-out was performed and  flexible fiberoptic bronchoscopy was performed via the endotracheal tube.  There were some thick white secretions.  There was normal endobronchial anatomy with no endobronchial lesions to the level of subsegmental bronchi.  The bronchoscope was removed.  The endobronchial ultrasound probe was advanced and systematic inspection of the mediastinal and right hilar lymph node stations was carried out.  Aspirations were performed of the level 4R, 7, and 11R nodes.  With each aspiration, the needle was advanced into the node with ultrasound guidance.  Suction was applied and 10-12 passes were made with the needle in the lymph node visualizing with ultrasound the entire time.  The specimens then were placed on to slides for immediate examination and into cell block.  There was minimal bleeding with aspiration at each of the lymph node stations.  These specimens were sent for quick prep.  The endobronchial ultrasound probe was removed.  The bronchoscope was reinserted.  The locatable guide for navigation was placed through the scope.  Registration was performed.  There was good correlation of the video and virtual bronchoscopy.  The scope then was advanced to the takeoff of the right upper lobe bronchus.  The locatable guide was then advanced into the appropriate subsegmental bronchus and advanced to within 3 mm of the center of the right upper lobe nodule. This was a  bilobed nodule and only one side of the nodule could be accessed despite multiple attempts to access the second lobe through different segmental bronchi.  Needle aspirations were performed with the specimens being placed onto slides as well as into cytologic preparation fluid for cell block.  Brushings were performed and then multiple biopsies were taken. All sampling of the upper lobe nodules was done with fluoroscopic guidance and the total fluoroscopy time for the entire procedure was 4 minutes.  Periodically during the  sampling, the locatable guide was reinserted to ensure proximity and alignment with the nodule that was being biopsied.  By this point, the mediastinal lymph node aspirations returned which malignancy noted on the 4R nodes.  Therefore, all the lung nodule samples were sent for permanent pathology only.  There was minimal bleeding with sampling from the right upper lobe nodule.  The locatable guide then was reinserted.  The scope was directed to the left upper lobe bronchus and the appropriate subsegmental bronchus was cannulated.  The locatable guide was advanced within 8 mm of the left upper lobe mass and there was good alignment.  The sampling process was repeated on the left side in the same fashion was done on the right. Again, the specimens were sent only for permanent pathology.  After the left upper lobe nodule had been sampled, the locatable guide and sheath were removed.  Saline was used to clear the small amounts of blood from the airways.  There was no ongoing bleeding.  The bronchoscope was removed.  The patient was extubated in the operating room and taken to the postanesthetic care unit in good condition.     Revonda Standard [name], M.D.     SCH/MEDQ  D:  05/28/2016  T:  05/28/2016  Job:  [id number]

## 2016-05-28 NOTE — Anesthesia Preprocedure Evaluation (Addendum)
Anesthesia Evaluation  Patient identified by MRN, date of birth, ID band Patient awake    Reviewed: Allergy & Precautions, NPO status , Patient's Chart, lab work & pertinent test results, reviewed documented beta blocker date and time   History of Anesthesia Complications Negative for: history of anesthetic complications  Airway Mallampati: II  TM Distance: >3 FB Neck ROM: Full    Dental no notable dental hx. (+) Dental Advisory Given, Edentulous Upper, Edentulous Lower   Pulmonary neg pulmonary ROS, sleep apnea and Continuous Positive Airway Pressure Ventilation , Current Smoker,    Pulmonary exam normal        Cardiovascular hypertension, negative cardio ROS Normal cardiovascular exam     Neuro/Psych PSYCHIATRIC DISORDERS Anxiety Depression negative neurological ROS  negative psych ROS   GI/Hepatic negative GI ROS, Neg liver ROS,   Endo/Other  negative endocrine ROSdiabetes  Renal/GU negative Renal ROS  negative genitourinary   Musculoskeletal negative musculoskeletal ROS (+)   Abdominal   Peds negative pediatric ROS (+)  Hematology negative hematology ROS (+)   Anesthesia Other Findings   Reproductive/Obstetrics negative OB ROS                             Anesthesia Physical Anesthesia Plan  ASA: III  Anesthesia Plan: General   Post-op Pain Management:    Induction: Intravenous  Airway Management Planned: Oral ETT  Additional Equipment:   Intra-op Plan:   Post-operative Plan: Extubation in OR  Informed Consent: I have reviewed the patients History and Physical, chart, labs and discussed the procedure including the risks, benefits and alternatives for the proposed anesthesia with the patient or authorized representative who has indicated his/her understanding and acceptance.   Dental advisory given  Plan Discussed with: CRNA, Anesthesiologist and Surgeon  Anesthesia  Plan Comments:         Anesthesia Quick Evaluation

## 2016-05-28 NOTE — Interval H&P Note (Signed)
History and Physical Interval Note:  05/28/2016 1:30 PM  John [name]  has presented today for surgery, with the diagnosis of [place]  The various methods of treatment have been discussed with the patient and family. After consideration of risks, benefits and other options for treatment, the patient has consented to  Procedure(s): VIDEO BRONCHOSCOPY WITH ENDOBRONCHIAL NAVIGATION (N/A) VIDEO BRONCHOSCOPY WITH ENDOBRONCHIAL ULTRASOUND (N/A) POSSIBLE MEDIASTINOSCOPY (N/A) as a surgical intervention .  The patient's history has been reviewed, patient examined, no change in status, stable for surgery.  I have reviewed the patient's chart and labs.  Questions were answered to the patient's satisfaction.     [name]

## 2016-05-28 NOTE — Anesthesia Postprocedure Evaluation (Addendum)
Anesthesia Post Note  Patient: Cree Kunert  Procedure(s) Performed: Procedure(s) (LRB): VIDEO BRONCHOSCOPY WITH ENDOBRONCHIAL NAVIGATION (N/A) VIDEO BRONCHOSCOPY WITH ENDOBRONCHIAL ULTRASOUND (N/A)  Patient location during evaluation: PACU Anesthesia Type: General Level of consciousness: sedated Pain management: pain level controlled Vital Signs Assessment: post-procedure vital signs reviewed and stable Respiratory status: spontaneous breathing and respiratory function stable Cardiovascular status: stable Anesthetic complications: no       Last Vitals:  Vitals:   05/28/16 1813 05/28/16 1815  BP: (!) 106/55   Pulse: 87 87  Resp: 17 18  Temp:      Last Pain:  Vitals:   05/28/16 1800  TempSrc:   PainSc: 0-No pain                 Ruey Storer DANIEL

## 2016-05-28 NOTE — H&P (View-Only) (Signed)
PCP is [name], MD Referring Provider is Sheldon Silvan Manon Hilding, PA-C  Chief Complaint  Patient presents with  . Lung Lesion    Surgical eval, Chest CT 04/17/16, PET Scan 04/24/16, Brain MRI 05/06/16...had ECHO 05/05/16, to have repeat on 05/14/16 for moderate PERICARDIAL EFFUSION, MYOCARDIAL NUCLEAR MED STUDY 05/05/16    HPI: [name] is a 71 year old gentleman sent for consultation regarding bilateral lung nodules and mediastinal adenopathy.  [name] is a 71 year old man with a history of hypertension, hyperlipidemia, type 2 diabetes with peripheral neuropathy, and COPD. He recently had a chest cold and was hurting across his chest. He had a chest x-ray which showed a possible pneumonia. He was treated with antibiotics and went back 3 weeks later for a repeat chest x-ray, which was unchanged. He then was referred for a CT which showed a 3.8 cm bilobed mass in the right upper lobe and a 1.3 cm left upper lobe nodule. There was some mild right paratracheal hilar and left paratracheal adenopathy. He had a PET/CT which showed the right upper lobe mass or masses was markedly hypermetabolic. The left upper lobe nodule is mildly hypermetabolic as were the right paratracheal lymph nodes.  He recently has been having problems with blood pressure control. He gets short of breath with exertion, but can walk up to 300 yards prior to it being significant. He has not had any change in appetite or weight loss. He does complain of decreased energy. He has had a productive cough. He denies hemoptysis. He complains of wheezing. He has sleep apnea and uses CPAP at night. He saw Dr.Koneswaran. A stress Myoview was a low risk study. He has not had any significant weight loss. He has been smoking since age 60. Maximum use was 1.5 packs per day. He currently says he's down to 0.5 packs per day  Zubrod Score: At the time of surgery this patient's most appropriate activity status/level should be described as: '[]'$     0     Normal activity, no symptoms '[x]'$     1    Restricted in physical strenuous activity but ambulatory, able to do out light work '[]'$     2    Ambulatory and capable of self care, unable to do work activities, up and about >50 % of waking hours                              '[]'$     3    Only limited self care, in bed greater than 50% of waking hours '[]'$     4    Completely disabled, no self care, confined to bed or chair '[]'$     5    Moribund    Past Medical History:  Diagnosis Date  . COPD (chronic obstructive pulmonary disease) ([place])   . Diabetes mellitus without complication ([place])   . Hyperlipidemia   . Neuropathy ([place])   . Prostatitis   Hypertension  No past surgical history on file.  Family History  Problem Relation Age of Onset  . Hypertension Mother   . Diabetes Father   . Heart disease Father   . Hypertension Sister     Social History Social History  Substance Use Topics  . Smoking status: Current Every Day Smoker    Packs/day: 1.50    Years: 55.00    Types: Cigarettes    Start date: 03/11/1961  . Smokeless tobacco: Never Used  . Alcohol use No  Current Outpatient Prescriptions  Medication Sig Dispense Refill  . aspirin EC 81 MG tablet Take 81 mg by mouth daily.    . finasteride (PROSCAR) 5 MG tablet Take 1 tablet by mouth daily.    . fluticasone (FLONASE) 50 MCG/ACT nasal spray Place 2 sprays into both nostrils daily. 16 g 6  . gabapentin (NEURONTIN) 300 MG capsule 1 tab in am, 2 tab in pm    . INCRUSE ELLIPTA 62.5 MCG/INH AEPB INHALE 1 PUFF INTO THE LUNGS DAILY. 30 each 2  . irbesartan-hydrochlorothiazide (AVALIDE) 300-12.5 MG tablet Take 1 tablet by mouth daily. 90 tablet 1  . meloxicam (MOBIC) 7.5 MG tablet TAKE 1 TABLET EVERY DAY FOR BACK PAIN 90 tablet 0  . pravastatin (PRAVACHOL) 40 MG tablet 1 tablet every third day    . tamsulosin (FLOMAX) 0.4 MG CAPS capsule Take 0.4 mg by mouth daily after breakfast.    . amLODipine (NORVASC) 5 MG tablet Take one tablet by  mouth now and take one tablet tomorrow morning. (Patient not taking: Reported on 05/12/2016) 2 tablet 0   No current facility-administered medications for this visit.     No Known Allergies  Review of Systems  Constitutional: Positive for activity change and fatigue. Negative for appetite change and unexpected weight change.  HENT: Positive for dental problem (dentures). Negative for trouble swallowing and voice change.   Eyes: Negative for visual disturbance.  Respiratory: Positive for apnea (CPAP at night), cough, shortness of breath and wheezing.   Gastrointestinal: Negative for abdominal pain and blood in stool.  Genitourinary: Positive for frequency and urgency. Negative for dysuria.  Musculoskeletal: Positive for arthralgias and joint swelling.  Neurological: Positive for numbness (feet).  Hematological: Negative for adenopathy.  All other systems reviewed and are negative.   BP (!) 220/104 (BP Location: Left Arm, Patient Position: Sitting, Cuff Size: Large)   Pulse (!) 108   Resp 16   Ht '5\' 7"'$  (1.702 m)   Wt 184 lb (83.5 kg)   SpO2 98% Comment: RA  BMI 28.82 kg/m  Physical Exam  Constitutional: He is oriented to person, place, and time. He appears well-developed and well-nourished. No distress.  HENT:  Head: Normocephalic and atraumatic.  Mouth/Throat: No oropharyngeal exudate.  Eyes: Conjunctivae and EOM are normal. No scleral icterus.  Neck: Neck supple. No thyromegaly present.  Cardiovascular: Normal rate, regular rhythm and normal heart sounds.  Exam reveals no gallop and no friction rub.   No murmur heard. Pulmonary/Chest: Effort normal. No respiratory distress. He has no wheezes. He has no rales.  Abdominal: Soft. He exhibits no distension. There is no tenderness.  Musculoskeletal: He exhibits no edema.  Lymphadenopathy:    He has no cervical adenopathy.  Neurological: He is alert and oriented to person, place, and time. No cranial nerve deficit. He exhibits  normal muscle tone.  Skin: Skin is warm and dry.  Vitals reviewed.    Diagnostic Tests: CT CHEST WITH CONTRAST  TECHNIQUE: Multidetector CT imaging of the chest was performed during intravenous contrast administration.  CONTRAST:  32m ISOVUE-300 IOPAMIDOL (ISOVUE-300) INJECTION 61%  COMPARISON:  None.  FINDINGS: Cardiovascular: Normal heart size. Small pericardial effusion. Aortic atherosclerosis noted. Calcification within the LAD and RCA coronary arteries noted.  Mediastinum/Nodes: The trachea appears patent and is midline. Normal appearance of the esophagus. Left paratracheal lymph node is borderline enlarged measuring 10 mm, image 54 of series 2. Right paratracheal lymph node measures 9 mm, image 45 of series 2. Lymph node in the  right hilar region measures 11 mm, image 63 of series 2.  Lungs/Pleura: There is no pleural fluid. Moderate to advanced changes of centrilobular emphysema. There is a mass within the right upper lobe which measures 3.1 x 3.8 by 3.0 cm. Diffuse bronchial wall thickening identified. Left upper lobe pulmonary nodule is identified measuring 1.3 cm, image 23 of series 3.  Upper Abdomen: There is no acute abnormality identified within the visualized portions of the upper abdomen.  Musculoskeletal: Multi level degenerative disc disease is identified within the thoracic spine. No aggressive lytic or sclerotic bone lesions identified.  IMPRESSION: 1. Examination is positive for bilateral upper lobe pulmonary lesions suspicious for malignancy. Further investigation with PET-CT and tissue sampling is advised. 2. Borderline enlarged paratracheal lymph nodes and right hilar nodes. Cannot rule out metastatic adenopathy. Attention to these areas on PET-CT is advised. 3. Emphysema 4. Aortic atherosclerosis and multi vessel coronary artery calcification.   Electronically Signed   By: Kerby Moors M.D.   On: 04/17/2016 11:19 NUCLEAR  MEDICINE PET SKULL BASE TO THIGH  TECHNIQUE: 10.0 mCi F-18 FDG was injected intravenously. Full-ring PET imaging was performed from the skull base to thigh after the radiotracer. CT data was obtained and used for attenuation correction and anatomic localization.  FASTING BLOOD GLUCOSE:  Value: 15 mg/dl  COMPARISON:  Chest CT 04/17/2016  FINDINGS: NECK  No hypermetabolic lymph nodes in the neck.  CHEST  Adjacent hypermetabolic nodes in the nodules in the RIGHT upper lobe measure 1.7 and 2.0 cm (image 16 and 18 ; series 7). Both these nodules are hypermetabolic with SUV max equal 18.  Hypermetabolic RIGHT hilar lymph node is small with SUV max equal 4.4. Hypermetabolic RIGHT paratracheal lymph node is small at 6 mm with SUV max equal 3.8. No hypermetabolic contralateral lymph nodes. No hypermetabolic supraclavicular nodes.  Within the LEFT upper lobe 11 mm nodule (image 8, series 7) has low metabolic activity SUV max equal 1.6.  Within the LEFT lower lobe 6 mm nodule (image 40, series 7) does not have clear metabolic activity.  ABDOMEN/PELVIS  No abnormal hypermetabolic activity within the liver, pancreas, adrenal glands, or spleen. No hypermetabolic lymph nodes in the abdomen or pelvis.  SKELETON  No aggressive osseous lesion.  IMPRESSION: 1. Adjacent hypermetabolic nodules in the RIGHT upper lobe consistent bronchogenic carcinoma. 2. Suspicion of ipsilateral hilar and paratracheal nodal metastasis. 3. Nodule in the LEFT upper lobe with suspicious morphology has a relatively low metabolic activity for size. Favor synchronous primary bronchogenic carcinoma. 4. No metabolic activity associated with a small LEFT lower lobe pulmonary nodule. Recommend attention on follow-up.   Electronically Signed   By: Suzy Bouchard M.D.   On: 04/24/2016 13:54 I personally reviewed the CT and PET CT images and concur with the findings noted above    Impression: 71 year old man with a long-standing history of tobacco abuse and known COPD who has bilateral upper lobe nodules along with some hilar and mediastinal adenopathy. His CT and PET CT findings are highly suspicious for synchronous primary bronchogenic carcinomas. His left upper lobe nodule appeared to be stage IA. Also possible this metastatic disease morphologically appears more consistent with a second primary. His right lung mass is more concerning and likely is at least stage II, if not stage III. I think the best way to approach this from a diagnostic standpoint would be to do a navigational bronchoscopy which will allow [place] to access both sides at the same setting. There is of course  no guarantee of a definitive diagnosis with that.  I discussed the proposed procedure of electromagnetic navigational bronchoscopy, endobronchial ultrasound, and possible mediastinoscopy with Mr. Stocks in his friend. I informed him of the general nature of the procedure and the need for general anesthesia. We will plan to do this on an outpatient basis. He does understand this is diagnostic and not therapeutic. I informed him of the indications, risks, benefits, and alternatives. He understands that there is a risk that the procedure will be nondiagnostic. He understands the risks include, but are not limited to death, MI, DVT, PE, stroke, bleeding, infection, pneumothorax, recurrent nerve injury leading to hoarseness, an esophageal injury.  He accepts those risks and wishes to proceed. He very much wants to know what he is dealing with.  Hypertension- unfortunately his blood pressures extremely poorly controlled. His been running over 190 for several weeks now. He was initially 287 systolic on arrival here today and I recommended he go to the emergency room, but he did not want to do so. I told him that we really can't do anything turns of the biopsy until his blood pressure is under control. Hopefully that  can be accomplished over the next couple of weeks.  Tobacco use- Smoking cessation instruction/counseling given:  counseled patient on the dangers of tobacco use, advised patient to stop smoking, and reviewed strategies to maximize success.  Plan:  Quit smoking  Electromagnetic navigational bronchoscopy and endobronchial ultrasound with possible mediastinoscopy tentatively on 05/28/2016 pending blood pressure control  [name], MD [facility] [phone]

## 2016-05-28 NOTE — Transfer of Care (Signed)
Immediate Anesthesia Transfer of Care Note  Patient: John [name]  Procedure(s) Performed: Procedure(s): VIDEO BRONCHOSCOPY WITH ENDOBRONCHIAL NAVIGATION (N/A) VIDEO BRONCHOSCOPY WITH ENDOBRONCHIAL ULTRASOUND (N/A)  Patient Location: PACU  Anesthesia Type:General  Level of Consciousness: awake, sedated and patient cooperative  Airway & Oxygen Therapy: Patient Spontanous Breathing and Patient connected to face mask oxygen  Post-op Assessment: Report given to RN, Post -op Vital signs reviewed and stable, Patient moving all extremities and Patient moving all extremities X 4  Post vital signs: Reviewed and stable  Last Vitals:  Vitals:   05/28/16 1225  BP: (!) 174/94  Pulse: 78  Resp: 18  Temp: 36.6 C    Last Pain:  Vitals:   05/28/16 1225  TempSrc: Oral         Complications: No apparent anesthesia complications

## 2016-05-29 ENCOUNTER — Encounter (HOSPITAL_COMMUNITY): Payer: Self-pay | Admitting: Thoracic Surgery (Cardiothoracic Vascular Surgery)

## 2016-06-03 ENCOUNTER — Ambulatory Visit: Payer: Medicare Other | Admitting: Cardiovascular Disease

## 2016-06-03 ENCOUNTER — Other Ambulatory Visit: Payer: Self-pay | Admitting: Family Medicine

## 2016-06-05 ENCOUNTER — Encounter (HOSPITAL_COMMUNITY): Payer: Self-pay | Admitting: Oncology

## 2016-06-05 ENCOUNTER — Encounter (HOSPITAL_COMMUNITY): Payer: Self-pay | Admitting: Lab

## 2016-06-05 ENCOUNTER — Telehealth: Payer: Self-pay | Admitting: Family Medicine

## 2016-06-05 ENCOUNTER — Encounter (HOSPITAL_COMMUNITY): Payer: Medicare Other | Attending: Oncology | Admitting: Oncology

## 2016-06-05 VITALS — BP 189/115 | HR 84 | Temp 98.5°F | Resp 16 | Ht 66.0 in | Wt 188.0 lb

## 2016-06-05 DIAGNOSIS — R0602 Shortness of breath: Secondary | ICD-10-CM | POA: Diagnosis not present

## 2016-06-05 DIAGNOSIS — R300 Dysuria: Secondary | ICD-10-CM | POA: Insufficient documentation

## 2016-06-05 DIAGNOSIS — C3491 Malignant neoplasm of unspecified part of right bronchus or lung: Secondary | ICD-10-CM | POA: Diagnosis not present

## 2016-06-05 DIAGNOSIS — Z5111 Encounter for antineoplastic chemotherapy: Secondary | ICD-10-CM | POA: Insufficient documentation

## 2016-06-05 DIAGNOSIS — R739 Hyperglycemia, unspecified: Secondary | ICD-10-CM | POA: Insufficient documentation

## 2016-06-05 MED ORDER — ALBUTEROL SULFATE HFA 108 (90 BASE) MCG/ACT IN AERS: 2.0000 | INHALATION_SPRAY | Freq: Four times a day (QID) | RESPIRATORY_TRACT | 2 refills | Status: DC | PRN

## 2016-06-05 NOTE — Telephone Encounter (Signed)
Opened by mistake, pt has had f/u woith onc today for non small cell lung Ca.   Laroy Apple, MD [facility] 06/05/2016, 12:47 PM

## 2016-06-05 NOTE — Assessment & Plan Note (Addendum)
NSCLC of right lung with hilar and paratracheal lymphadenopathy on PET imaging, favoring adenocarcinoma.  Complicated staging situation but in giving the benefit of the doubt, he is staged as a Stage IIB (T1bN1M0) with two adjacent RUL pulmonary nodules, RIGHT-sided lymphadenopathy as described AND a LUL pulmonary nodule measuring 11 mm with low metabolic activity biopsied but without any malignancy identified.  Oncology history developed.  Staging in [facility] problem list completed.  No role for labs today from an oncology perspective.  I personally reviewed and went over radiographic studies with the patient.  The results are noted within this dictation.  PET imaging demonstrated two adjacent RUL pulmonary nodules with hypermetabolic activity, RIGHT hilar and paratracheal adenopathy with hypermetabolic activity, and a LUL pulmonary nodule measuring 11 mm in size with low-level hypermetabolic activity.  I personally reviewed and went over pathology results with the patient.  Pathology from needle aspiration and RUL lesion was positive for NSCLC, favoring adenocarcinoma.  Without definitive differentiation between squamous cell/adenocarcinoma, treatment options were discussed that would cover either cell type.  LUL lung lesion biopsy was negative for malignancy.  Lymph node sampling was also negative for malignancy.  With all of this information, I have staged him as a Stage IIB with the understanding that I am not convinced the LUL lesion is truly negative for malignancy but likely a separate primary that will need to be addressed in the future.  Additionally, despite negative lymph node sampling, I am convinced that the lymph nodes based upon PET imaging are positive for disease.  I think this is the most appropriate way to stage him and frame treatment options.  I have provided him significant amount of patient information regarding NSCLC.  He is provided NCCN guidelines patient information regarding his  diagnosis.  I have reviewed the NCCN guidelines algorithm for treatment which recommends chemoradiation.  I reviewed systemic chemotherapy options in the setting of concomitant radiation: 1. Cisplatin days 1, 8, 29, 36 and Etoposide days 1-5 and days 29-33. 2. Carboplatin/Paclitaxel weekly during XRT +/- 2 additional cycles (full dose every 21 days).  **In the [place], the most common regimens used are cisplatin and etoposide (EP) and dose reduced weekly carboplatin and paclitaxel (CP) during radiation followed by 2 cycles of the same agents as systemic doses. Although phase 3 studies to compare these regimens do not currently exist, a retrospective analysis of [place] compare the outcomes of 499 patients treated with EP and 4132 treated with CP.  No difference in overall survival was noted between the 2 regimens, but EP is associated with increased toxicities in more hospitalizations. This study has several limitations associated with inherent biases of retrospective studies; however, in the absence of randomized control trial, it is the best data available and suggest that any difference in survival outcomes in the two regimens is likely small.**  He has opted for treatment #2.  I reviewed the risks, benefits, alternatives, and side effects of therapy including, but not limited to, myelosuppression hypersensitivity reaction occuring in 20-40% of patients, neurotoxicity mainly in the form of sensory neuropathy with numbness and paresthesias, transient asymptomatic bradycardia is the most commonly observed cardiotoxicity, alopecia occurs in nearly all patients with total body hair loss, mucositis and/or diarrhea in 30-40% of patients, transient elevations in serum transaminases, bilirubin, and alkaline phophatase, and onycholysis which is mainly observed in those receiving >6 courses on a weekly schedule and not typical in those receiving paclitaxel on an every 3 week  schedule, change  in renal function tests, increased risk of infection, fatigue, nausea, vomiting, diarrhea/constipation, bone pain, and death.  I have discussed the case with Dr. Lianne Cure, radiation oncologist.  I have discussed the staging situation and the medical oncology plan moving forward.  He is agreeable to this plan.  I will refer the patient to Dr. Lianne Cure for radiation oncology consultation and planning for concomitant chemoradiation with weekly Carboplatin/Paclitaxel.  The patient is provided education regarding port-a-cath.  He will be referred to Dr. Arnoldo Morale for port placement within the next 10-14 day.    He has been referred to Nurse Navigator, Anderson Malta, to help coordinate his care.  We will be ready to start chemotherapy with the start of XRT.  Chemotherapy plan is built via V[facility] pathways.  His left axillary "edema" is unimpressive on exam.  For his SOB, albuterol inhaler is prescribed.  If not improved, I would not hesitate imaging his chest given recent biopsy.  Pulmonary toilet/incentive spirometry is recommended to continue.  Given his anxiety, I will plan on seeing him on day 1 of cycle 1 if there is availability in my schedule to accommodate that.  If not, he will be seen on day 1 cycle #2 for follow-up and tolerance check.  Following completion of chemoXRT, he would be a candidate for consolidative immunotherapy with Imfinzi.  More than 50% of the time spent with the patient was utilized for counseling and coordination of care.

## 2016-06-05 NOTE — Progress Notes (Signed)
Met with pt face to face.  Introduced myself and explain a little bit about my role as the patient navigator.  Pt given a card with all my information on it.  Told pt to call if they had any questions or concerns.  Pt verbalized understanding.   Weekly chemo with radiation.  Referral to RAD ONC.  Jenkins for port.  appt 06/11/2016.

## 2016-06-05 NOTE — Progress Notes (Signed)
John File, MD [place] [address]  Non-small cell lung cancer, right [facility]) - Plan: CBC with Differential, Comprehensive metabolic panel, Magnesium  SOB (shortness of breath) on exertion - Plan: albuterol (PROVENTIL HFA;VENTOLIN HFA) 108 (90 Base) MCG/ACT inhaler  CURRENT THERAPY: Development of treatment plan  INTERVAL HISTORY: John [name] 71 y.o. male returns for followup of NSCLC of right lung with hilar and paratracheal lymphadenopathy on PET imaging, favoring adenocarcinoma.  Complicated staging situation but in giving the benefit of the doubt, he is staged as a Stage IIB (T1bN1M0) with two adjacent RUL pulmonary nodules, RIGHT-sided lymphadenopathy as described AND a LUL pulmonary nodule measuring 11 mm with low metabolic activity biopsied but without any malignancy identified.     Non-small cell lung cancer, right ([place])   04/17/2016 Imaging    CT chest- 1. Examination is positive for bilateral upper lobe pulmonary lesions suspicious for malignancy. Further investigation with PET-CT and tissue sampling is advised. 2. Borderline enlarged paratracheal lymph nodes and right hilar nodes. Cannot rule out metastatic adenopathy. Attention to these areas on PET-CT is advised. 3. Emphysema 4. Aortic atherosclerosis and multi vessel coronary artery calcification.      04/18/2016 Initial Diagnosis    Adenocarcinoma of lung, right ([place])     04/24/2016 PET scan    1. Adjacent hypermetabolic nodules in the RIGHT upper lobe consistent bronchogenic carcinoma. 2. Suspicion of ipsilateral hilar and paratracheal nodal metastasis. 3. Nodule in the LEFT upper lobe with suspicious morphology has a relatively low metabolic activity for size. Favor synchronous primary bronchogenic carcinoma. 4. No metabolic activity associated with a small LEFT lower lobe pulmonary nodule. Recommend attention on follow-up.      05/06/2016 Imaging    MRI brain- Negative for  metastatic disease.      05/28/2016 Procedure    1. Electromagnetic navigational bronchoscopy with brushings, needle     aspirations and biopsies of bilateral upper lobe nodules. 2. Endobronchial ultrasound with mediastinal and hilar lymph node     aspirations. By Dr. [name]      06/03/2016 Pathology Results    Diagnosis 1. Lung, biopsy, Right upper lobe - BLOOD AND BENIGN BRONCHIAL AND LUNG TISSUE. 2. Lung, biopsy, Left upper lobe - BLOOD AND BENIGN BRONCHIAL AND LUNG TISSUE.      06/03/2016 Pathology Results    Diagnosis TRANSBRONCHIAL NEEDLE ASPIRATION, NAVIGATION, RUL (SPECIMEN 5 OF 7, COLLECTED 05/28/16): MALIGNANT CELLS CONSISTENT WITH NON-SMALL CELL CARCINOMA Comment There are limited tumor cells in the cell block (insufficient for molecular testing). TTF-1 is positive in a few of the atypical cells. They appear negative for synaptophysin and cytokeratin 5/6. Overall there is limited tumor, but a lung adenocarcinoma is slightly favored. Dr. [name] has reviewed the case. The case was called to Dr. [name] on 06/03/2016.       He reports SOB with exertion without chest pain or cough.  He notes that it has progressively worsened.  He is compliant with his home inhaler.  He denies any palpations or radiation of pain.  SOB resolves with rest.  He reports a left axillary swelling without palpable abnormality.  He is provided education regarding his newly diagnosed NSCLC.  We reviewed his staging, his pathology, his PET scan, and NCCN guidelines regarding treatment options.  Review of Systems  Constitutional: Negative.  Negative for chills, fever and weight loss.  HENT: Negative.   Eyes: Negative.   Respiratory: Positive for shortness of breath (with exertion) and  wheezing. Negative for cough and hemoptysis.   Cardiovascular: Negative for chest pain and palpitations.  Gastrointestinal: Negative.  Negative for constipation, diarrhea, nausea and vomiting.    Genitourinary: Negative.   Musculoskeletal: Negative.   Skin: Negative.   Neurological: Negative.  Negative for weakness.  Endo/Heme/Allergies: Negative.   Psychiatric/Behavioral: Negative.     Past Medical History:  Diagnosis Date  . Adenocarcinoma of lung, right ([place]) 04/18/2016  . Anxiety   . Arthritis   . COPD (chronic obstructive pulmonary disease) ([place])   . Depression   . Diabetes mellitus without complication (HCC)    no meds  . Dyspnea   . Hyperlipidemia   . Hypertension   . Neuropathy ([place])   . Non-small cell lung cancer, right ([place]) 04/18/2016  . Prostatitis   . Sleep apnea    cpap    Past Surgical History:  Procedure Laterality Date  . NO PAST SURGERIES    . VIDEO BRONCHOSCOPY WITH ENDOBRONCHIAL NAVIGATION N/A 05/28/2016   Procedure: VIDEO BRONCHOSCOPY WITH ENDOBRONCHIAL NAVIGATION;  Surgeon: [name], MD;  Location: [place];  Service: Thoracic;  Laterality: N/A;  . VIDEO BRONCHOSCOPY WITH ENDOBRONCHIAL ULTRASOUND N/A 05/28/2016   Procedure: VIDEO BRONCHOSCOPY WITH ENDOBRONCHIAL ULTRASOUND;  Surgeon: [name], MD;  Location: [facility] OR;  Service: Thoracic;  Laterality: N/A;    Family History  Problem Relation Age of Onset  . Hypertension Mother   . Diabetes Father   . Heart disease Father   . Hypertension Sister     Social History   Social History  . Marital status: Single    Spouse name: N/A  . Number of children: N/A  . Years of education: N/A   Social History Main Topics  . Smoking status: Current Every Day Smoker    Packs/day: 1.00    Years: 55.00    Types: Cigarettes    Start date: 03/11/1961  . Smokeless tobacco: Never Used  . Alcohol use No  . Drug use: No  . Sexual activity: Not Asked   Other Topics Concern  . None   Social History Narrative  . None     PHYSICAL EXAMINATION  ECOG PERFORMANCE STATUS: 1 - Symptomatic but completely ambulatory  Vitals:   06/05/16 0849  BP: (!) 189/115  Pulse: 84  Resp: 16   Temp: 98.5 F (36.9 C)    GENERAL:alert, no distress, well nourished, well developed, comfortable, cooperative, obese, smiling and accompanied by his wife. SKIN: skin color, texture, turgor are normal, no rashes or significant lesions HEAD: Normocephalic, No masses, lesions, tenderness or abnormalities EYES: normal, EOMI, Conjunctiva are pink and non-injected EARS: External ears normal OROPHARYNX:lips, buccal mucosa, and tongue normal and mucous membranes are moist  NECK: supple, no adenopathy, trachea midline LYMPH:  no palpable lymphadenopathy BREAST:not examined LUNGS: clear to auscultation and percussion HEART: regular rate & rhythm, no murmurs and no gallops ABDOMEN:abdomen soft, obese and normal bowel sounds BACK: Back symmetric, no curvature. EXTREMITIES:less then 2 second capillary refill, no joint deformities, effusion, or inflammation, no skin discoloration, no cyanosis  NEURO: alert & oriented x 3 with fluent speech, no focal motor/sensory deficits, gait normal   LABORATORY DATA: CBC    Component Value Date/Time   WBC 8.5 05/26/2016 1121   RBC 4.39 05/26/2016 1121   HGB 14.0 05/26/2016 1121   HCT 40.9 05/26/2016 1121   HCT 40.1 02/01/2016 1007   PLT 297 05/26/2016 1121   PLT 328 02/01/2016 1007   MCV 93.2 05/26/2016 1121  MCV 93 02/01/2016 1007   MCH 31.9 05/26/2016 1121   MCHC 34.2 05/26/2016 1121   RDW 13.8 05/26/2016 1121   RDW 14.2 02/01/2016 1007   LYMPHSABS 1.6 02/01/2016 1007   EOSABS 0.2 02/01/2016 1007   BASOSABS 0.0 02/01/2016 1007      Chemistry      Component Value Date/Time   NA 138 05/26/2016 1121   NA 139 05/19/2016 0856   K 4.1 05/26/2016 1121   CL 101 05/26/2016 1121   CO2 28 05/26/2016 1121   BUN 15 05/26/2016 1121   BUN 23 05/19/2016 0856   CREATININE 0.91 05/26/2016 1121      Component Value Date/Time   CALCIUM 9.2 05/26/2016 1121   ALKPHOS 90 05/26/2016 1121   AST 17 05/26/2016 1121   ALT 15 (L) 05/26/2016 1121   BILITOT  0.4 05/26/2016 1121   BILITOT 0.3 02/01/2016 1007        PENDING LABS:   RADIOGRAPHIC STUDIES:  Dg Chest 2 View  Result Date: 05/26/2016 CLINICAL DATA:  Preop for lung nodule biopsy. EXAM: CHEST  2 VIEW COMPARISON:  04/17/2016 FINDINGS: Biapical lung nodules which are subtle by radiography. No gross evidence of progression. There is no edema, consolidation, effusion, or pneumothorax. No acute osseous finding. IMPRESSION: 1. No acute finding. 2. Emphysema and bilateral lung nodules. Electronically Signed   By: Monte Fantasia M.D.   On: 05/26/2016 14:40   Dg C-arm Bronchoscopy  Result Date: 05/28/2016 C-ARM BRONCHOSCOPY: Fluoroscopy was utilized by the requesting physician.  No radiographic interpretation.     PATHOLOGY:    ASSESSMENT AND PLAN:  Non-small cell lung cancer, right (HCC) NSCLC of right lung with hilar and paratracheal lymphadenopathy on PET imaging, favoring adenocarcinoma.  Complicated staging situation but in giving the benefit of the doubt, he is staged as a Stage IIB (T1bN1M0) with two adjacent RUL pulmonary nodules, RIGHT-sided lymphadenopathy as described AND a LUL pulmonary nodule measuring 11 mm with low metabolic activity biopsied but without any malignancy identified.  Oncology history developed.  Staging in [facility] problem list completed.  No role for labs today from an oncology perspective.  I personally reviewed and went over radiographic studies with the patient.  The results are noted within this dictation.  PET imaging demonstrated two adjacent RUL pulmonary nodules with hypermetabolic activity, RIGHT hilar and paratracheal adenopathy with hypermetabolic activity, and a LUL pulmonary nodule measuring 11 mm in size with low-level hypermetabolic activity.  I personally reviewed and went over pathology results with the patient.  Pathology from needle aspiration and RUL lesion was positive for NSCLC, favoring adenocarcinoma.  Without definitive  differentiation between squamous cell/adenocarcinoma, treatment options were discussed that would cover either cell type.  LUL lung lesion biopsy was negative for malignancy.  Lymph node sampling was also negative for malignancy.  With all of this information, I have staged him as a Stage IIB with the understanding that I am not convinced the LUL lesion is truly negative for malignancy but likely a separate primary that will need to be addressed in the future.  Additionally, despite negative lymph node sampling, I am convinced that the lymph nodes based upon PET imaging are positive for disease.  I think this is the most appropriate way to stage him and frame treatment options.  I have provided him significant amount of patient information regarding NSCLC.  He is provided NCCN guidelines patient information regarding his diagnosis.  I have reviewed the NCCN guidelines algorithm for treatment which recommends chemoradiation.  I reviewed systemic chemotherapy options in the setting of concomitant radiation: 1. Cisplatin days 1, 8, 29, 36 and Etoposide days 1-5 and days 29-33. 2. Carboplatin/Paclitaxel weekly during XRT +/- 2 additional cycles (full dose every 21 days).  **In the [place], the most common regimens used are cisplatin and etoposide (EP) and dose reduced weekly carboplatin and paclitaxel (CP) during radiation followed by 2 cycles of the same agents as systemic doses. Although phase 3 studies to compare these regimens do not currently exist, a retrospective analysis of [place] compare the outcomes of 499 patients treated with EP and 2778 treated with CP.  No difference in overall survival was noted between the 2 regimens, but EP is associated with increased toxicities in more hospitalizations. This study has several limitations associated with inherent biases of retrospective studies; however, in the absence of randomized control trial, it is the best data  available and suggest that any difference in survival outcomes in the two regimens is likely small.**  He has opted for treatment #2.  I reviewed the risks, benefits, alternatives, and side effects of therapy including, but not limited to, myelosuppression hypersensitivity reaction occuring in 20-40% of patients, neurotoxicity mainly in the form of sensory neuropathy with numbness and paresthesias, transient asymptomatic bradycardia is the most commonly observed cardiotoxicity, alopecia occurs in nearly all patients with total body hair loss, mucositis and/or diarrhea in 30-40% of patients, transient elevations in serum transaminases, bilirubin, and alkaline phophatase, and onycholysis which is mainly observed in those receiving >6 courses on a weekly schedule and not typical in those receiving paclitaxel on an every 3 week schedule, change in renal function tests, increased risk of infection, fatigue, nausea, vomiting, diarrhea/constipation, bone pain, and death.  I have discussed the case with Dr. Lianne Cure, radiation oncologist.  I have discussed the staging situation and the medical oncology plan moving forward.  He is agreeable to this plan.  I will refer the patient to Dr. Lianne Cure for radiation oncology consultation and planning for concomitant chemoradiation with weekly Carboplatin/Paclitaxel.  The patient is provided education regarding port-a-cath.  He will be referred to Dr. Arnoldo Morale for port placement within the next 10-14 day.    He has been referred to Nurse Navigator, Anderson Malta, to help coordinate his care.  We will be ready to start chemotherapy with the start of XRT.  Chemotherapy plan is built via VIA pathways.  His left axillary "edema" is unimpressive on exam.  For his SOB, albuterol inhaler is prescribed.  If not improved, I would not hesitate imaging his chest given recent biopsy.  Pulmonary toilet/incentive spirometry is recommended to continue.  Given his anxiety, I will plan on  seeing him on day 1 of cycle 1 if there is availability in my schedule to accommodate that.  If not, he will be seen on day 1 cycle #2 for follow-up and tolerance check.  Following completion of chemoXRT, he would be a candidate for consolidative immunotherapy with Imfinzi.  More than 50% of the time spent with the patient was utilized for counseling and coordination of care.   ORDERS PLACED FOR THIS ENCOUNTER: Orders Placed This Encounter  Procedures  . CBC with Differential  . Comprehensive metabolic panel  . Magnesium    MEDICATIONS PRESCRIBED THIS ENCOUNTER: Meds ordered this encounter  Medications  . albuterol (PROVENTIL HFA;VENTOLIN HFA) 108 (90 Base) MCG/ACT inhaler    Sig: Inhale 2 puffs into the lungs every 6 (six) hours as needed for wheezing or shortness of  breath.    Dispense:  1 Inhaler    Refill:  2    Order Specific Question:   Supervising Provider    Answer:   Brunetta Genera [[id number]]    THERAPY PLAN:  Concomitant chemoXRT with weekly Carboplatin/Paclitaxel +/- 2 additional cycles of Carboplatin/Paclitaxel full-dose every 21 days, followed by immunotherapy (Imfinzi) up to 52 weeks.  All questions were answered. The patient knows to call the clinic with any problems, questions or concerns. We can certainly see the patient much sooner if necessary.  Patient and plan discussed with Dr. Twana First and she is in agreement with the aforementioned.   This note is electronically signed by: Doy Mince 06/05/2016 3:47 PM

## 2016-06-05 NOTE — Progress Notes (Signed)
START ON PATHWAY REGIMEN - Non-Small Cell Lung     Administer weekly:     Paclitaxel        Dose Mod: None     Carboplatin        Dose Mod: None  **Always confirm dose/schedule in your pharmacy ordering system**    Patient Characteristics: Stage IIA/IIB - Unresectable AJCC T Category: T1b Current Disease Status: No Distant Mets or Local Recurrence AJCC N Category: N1 AJCC M Category: M0 AJCC 8 Stage Grouping: IIB  Intent of Therapy: Curative Intent, Discussed with Patient

## 2016-06-05 NOTE — Patient Instructions (Addendum)
[place] [facility] Discharge Instructions  RECOMMENDATIONS MADE BY THE CONSULTANT AND ANY TEST RESULTS WILL BE SENT TO YOUR REFERRING PHYSICIAN.  You were seen today by Kirby Crigler PA-C. Chemo and weekly Radiation. See Dr.Jenkins for port placement. Return Day 1 of cycle 1 for follow up.    Thank you for choosing [facility] to provide your oncology and hematology care.  To afford each patient quality time with our provider, please arrive at least 15 minutes before your scheduled appointment time.    If you have a lab appointment with the [facility] please come in thru the  Main Entrance and check in at the main information desk  You need to re-schedule your appointment should you arrive 10 or more minutes late.  We strive to give you quality time with our providers, and arriving late affects you and other patients whose appointments are after yours.  Also, if you no show three or more times for appointments you may be dismissed from the clinic at the providers discretion.     Again, thank you for choosing [facility].  Our hope is that these requests will decrease the amount of time that you wait before being seen by our physicians.       _____________________________________________________________  Should you have questions after your visit to [facility], please contact our office at [phone] between the hours of 8:30 a.m. and 4:30 p.m.  Voicemails left after 4:30 p.m. will not be returned until the following business day.  For prescription refill requests, have your pharmacy contact our office.       Resources For Cancer Patients and their Caregivers ? American Cancer Society: Can assist with transportation, wigs, general needs, runs Look Good Feel Better.        405-603-5754 ? Cancer Care: Provides financial assistance, online support groups, medication/co-pay assistance.   1-800-813-HOPE 340 490 7023) ? [facility] Assists [place] cancer patients and their families through emotional , educational and financial support.  (510) 189-3493 ? [place] DSS Where to apply for food stamps, Medicaid and utility assistance. 463-248-8686 ? [employer]: Transportation to medical appointments. 580-346-8629 ? Social Security Administration: May apply for disability if have a Stage IV cancer. (437) 279-0409 (220)782-6197 ? [employer], Disability and Transit Services: Assists with nutrition, care and transit needs. [place] Support Programs: '@10RELATIVEDAYS'$ @ > Cancer Support Group  2nd Tuesday of the month 1pm-2pm, Journey Room  > Creative Journey  3rd Tuesday of the month 1130am-1pm, Journey Room  > Look Good Feel Better  1st Wednesday of the month 10am-12 noon, Journey Room (Call [place] to register [phone])

## 2016-06-05 NOTE — Progress Notes (Unsigned)
Referral to [facility].  Records faxed on 3/1

## 2016-06-06 ENCOUNTER — Other Ambulatory Visit (HOSPITAL_COMMUNITY): Payer: Self-pay | Admitting: Oncology

## 2016-06-09 ENCOUNTER — Telehealth (HOSPITAL_COMMUNITY): Payer: Self-pay | Admitting: Emergency Medicine

## 2016-06-09 NOTE — Telephone Encounter (Signed)
Called pt to set up chemo teaching.  Pt is going to come after pre-admission testing on 06/11/2016 at 3:00 pm.

## 2016-06-09 NOTE — Patient Instructions (Signed)
John [name]  06/09/2016     '@[email or web address]'$ @   Your procedure is scheduled on  06/13/2016   Report to [facility] at  845  A.M.  Call this number if you have problems the morning of surgery:  [phone]   Remember:  Do not eat food or drink liquids after midnight.  Take these medicines the morning of surgery with A SIP OF WATER  Catapress, proscar, neurontin, avalide, ativan, mobic, metoprolol, flomax. Take your inhaler before you come and bring your rescue inhaler with you if you have one.   Do not wear jewelry, make-up or nail polish.  Do not wear lotions, powders, or perfumes, or deoderant.  Do not shave 48 hours prior to surgery.  Men may shave face and neck.  Do not bring valuables to the hospital.  [facility] is not responsible for any belongings or valuables.  Contacts, dentures or bridgework may not be worn into surgery.  Leave your suitcase in the car.  After surgery it may be brought to your room.  For patients admitted to the hospital, discharge time will be determined by your treatment team.  Patients discharged the day of surgery will not be allowed to drive home.   Name and phone number of your driver:   family Special instructions:  None  Please read over the following fact sheets that you were given. Anesthesia Post-op Instructions and Care and Recovery After Surgery       Implanted Port Insertion Implanted port insertion is a procedure to put in a port and catheter. The port is a device with an injectable disk that can be accessed by your health care provider. The port is connected to a vein in the chest or neck by a small flexible tube (catheter). There are different types of ports. The implanted port may be used as a long-term IV access for:  Medicines, such as chemotherapy.  Fluids.  Liquid nutrition, such as total parenteral nutrition (TPN).  Blood samples. Having a port means that your health care provider will not need to  use the veins in your arms for these procedures. Tell a health care provider about:  Any allergies you have.  All medicines you are taking, especially blood thinners, as well as any vitamins, herbs, eye drops, creams, over-the-counter medicines, and steroids.  Any problems you or family members have had with anesthetic medicines.  Any blood disorders you have.  Any surgeries you have had.  Any medical conditions you have, including diabetes or kidney problems.  Whether you are pregnant or may be pregnant. What are the risks? Generally, this is a safe procedure. However, problems may occur, including:  Allergic reactions to medicines or dyes.  Damage to other structures or organs.  Infection.  Damage to the blood vessel, bruising, or bleeding at the puncture site.  Blood clot.  Breakdown of the skin over the port.  A collection of air in the chest that can cause one of the lungs to collapse (pneumothorax). This is rare. What happens before the procedure? Staying hydrated  Follow instructions from your health care provider about hydration, which may include:  Up to 2 hours before the procedure - you may continue to drink clear liquids, such as water, clear fruit juice, black coffee, and plain tea. Eating and drinking restrictions   Follow instructions from your health care provider about eating and drinking, which may include:  8 hours before the procedure -  stop eating heavy meals or foods such as meat, fried foods, or fatty foods.  6 hours before the procedure - stop eating light meals or foods, such as toast or cereal.  6 hours before the procedure - stop drinking milk or drinks that contain milk.  2 hours before the procedure - stop drinking clear liquids. Medicines   Ask your health care provider about:  Changing or stopping your regular medicines. This is especially important if you are taking diabetes medicines or blood thinners.  Taking medicines such as  aspirin and ibuprofen. These medicines can thin your blood. Do not take these medicines before your procedure if your health care provider instructs you not to.  You may be given antibiotic medicine to help prevent infection. General instructions   Plan to have someone take you home from the hospital or clinic.  If you will be going home right after the procedure, plan to have someone with you for 24 hours.  You may have blood tests.  You may be asked to shower with a germ-killing soap. What happens during the procedure?  To lower your risk of infection:  Your health care team will wash or sanitize their hands.  Your skin will be washed with soap.  Hair may be removed from the surgical area.  An IV tube will be inserted into one of your veins.  You will be given one or more of the following:  A medicine to help you relax (sedative).  A medicine to numb the area (local anesthetic).  Two small cuts (incisions) will be made to insert the port.  One incision will be made in your neck to get access to the vein where the catheter will lie.  The other incision will be made in the upper chest. This is where the port will lie.  The procedure may be done using continuous X-ray (fluoroscopy) or other imaging tools for guidance.  The port and catheter will be placed. There may be a small, raised area where the port is.  The port will be flushed with a salt solution (saline), and blood will be drawn to make sure that it is working correctly.  The incisions will be closed.  Bandages (dressings) may be placed over the incisions. The procedure may vary among health care providers and hospitals. What happens after the procedure?  Your blood pressure, heart rate, breathing rate, and blood oxygen level will be monitored until the medicines you were given have worn off.  Do not drive for 24 hours if you were given a sedative.  You will be given a manufacturer's information card for  the type of port that you have. Keep this with you.  Your port will need to be flushed and checked as told by your health care provider, usually every few weeks.  A chest X-ray will be done to:  Check the placement of the port.  Make sure there is no injury to your lung. Summary  Implanted port insertion is a procedure to put in a port and catheter.  The implanted port is used as a long-term IV access.  The port will need to be flushed and checked as told by your health care provider, usually every few weeks.  Keep your manufacturer's information card with you at all times. This information is not intended to replace advice given to you by your health care provider. Make sure you discuss any questions you have with your health care provider. Document Released: 01/12/2013 Document Revised: 02/13/2016  Document Reviewed: 02/13/2016 Elsevier Interactive Patient Education  2017 [place] Insertion, Care After This sheet gives you information about how to care for yourself after your procedure. Your health care provider may also give you more specific instructions. If you have problems or questions, contact your health care provider. What can I expect after the procedure? After your procedure, it is common to have:  Discomfort at the port insertion site.  Bruising on the skin over the port. This should improve over 3-4 days. Follow these instructions at home: [facility] care   After your port is placed, you will get a manufacturer's information card. The card has information about your port. Keep this card with you at all times.  Take care of the port as told by your health care provider. Ask your health care provider if you or a family member can get training for taking care of the port at home. A home health care nurse may also take care of the port.  Make sure to remember what type of port you have. Incision care   Follow instructions from your health care provider  about how to take care of your port insertion site. Make sure you:  Wash your hands with soap and water before you change your bandage (dressing). If soap and water are not available, use hand sanitizer.  Change your dressing as told by your health care provider.  Leave stitches (sutures), skin glue, or adhesive strips in place. These skin closures may need to stay in place for 2 weeks or longer. If adhesive strip edges start to loosen and curl up, you may trim the loose edges. Do not remove adhesive strips completely unless your health care provider tells you to do that.  Check your port insertion site every day for signs of infection. Check for:  More redness, swelling, or pain.  More fluid or blood.  Warmth.  Pus or a bad smell. General instructions   Do not take baths, swim, or use a hot tub until your health care provider approves.  Do not lift anything that is heavier than 10 lb (4.5 kg) for a week, or as told by your health care provider.  Ask your health care provider when it is okay to:  Return to work or school.  Resume usual physical activities or sports.  Do not drive for 24 hours if you were given a medicine to help you relax (sedative).  Take over-the-counter and prescription medicines only as told by your health care provider.  Wear a medical alert bracelet in case of an emergency. This will tell any health care providers that you have a port.  Keep all follow-up visits as told by your health care provider. This is important. Contact a health care provider if:  You cannot flush your port with saline as directed, or you cannot draw blood from the port.  You have a fever or chills.  You have more redness, swelling, or pain around your port insertion site.  You have more fluid or blood coming from your port insertion site.  Your port insertion site feels warm to the touch.  You have pus or a bad smell coming from the port insertion site. Get help right  away if:  You have chest pain or shortness of breath.  You have bleeding from your port that you cannot control. Summary  Take care of the port as told by your health care provider.  Change your dressing as told by your  health care provider.  Keep all follow-up visits as told by your health care provider. This information is not intended to replace advice given to you by your health care provider. Make sure you discuss any questions you have with your health care provider. Document Released: 01/12/2013 Document Revised: 02/13/2016 Document Reviewed: 02/13/2016 Elsevier Interactive Patient Education  2017 [place] An implanted port is a type of central line that is placed under the skin. Central lines are used to provide IV access when treatment or nutrition needs to be given through a person's veins. Implanted ports are used for long-term IV access. An implanted port may be placed because:  You need IV medicine that would be irritating to the small veins in your hands or arms.  You need long-term IV medicines, such as antibiotics.  You need IV nutrition for a long period.  You need frequent blood draws for lab tests.  You need dialysis. Implanted ports are usually placed in the chest area, but they can also be placed in the upper arm, the abdomen, or the leg. An implanted port has two main parts:  Reservoir. The reservoir is round and will appear as a small, raised area under your skin. The reservoir is the part where a needle is inserted to give medicines or draw blood.  Catheter. The catheter is a thin, flexible tube that extends from the reservoir. The catheter is placed into a large vein. Medicine that is inserted into the reservoir goes into the catheter and then into the vein. How will I care for my incision site? Do not get the incision site wet. Bathe or shower as directed by your health care provider. How is my port accessed? Special steps  must be taken to access the port:  Before the port is accessed, a numbing cream can be placed on the skin. This helps numb the skin over the port site.  Your health care provider uses a sterile technique to access the port.  Your health care provider must put on a mask and sterile gloves.  The skin over your port is cleaned carefully with an antiseptic and allowed to dry.  The port is gently pinched between sterile gloves, and a needle is inserted into the port.  Only "non-coring" port needles should be used to access the port. Once the port is accessed, a blood return should be checked. This helps ensure that the port is in the vein and is not clogged.  If your port needs to remain accessed for a constant infusion, a clear (transparent) bandage will be placed over the needle site. The bandage and needle will need to be changed every week, or as directed by your health care provider.  Keep the bandage covering the needle clean and dry. Do not get it wet. Follow your health care provider's instructions on how to take a shower or bath while the port is accessed.  If your port does not need to stay accessed, no bandage is needed over the port. What is flushing? Flushing helps keep the port from getting clogged. Follow your health care provider's instructions on how and when to flush the port. Ports are usually flushed with saline solution or a medicine called heparin. The need for flushing will depend on how the port is used.  If the port is used for intermittent medicines or blood draws, the port will need to be flushed:  After medicines have been given.  After blood has been drawn.  As part of routine maintenance.  If a constant infusion is running, the port may not need to be flushed. How long will my port stay implanted? The port can stay in for as long as your health care provider thinks it is needed. When it is time for the port to come out, surgery will be done to remove it. The  procedure is similar to the one performed when the port was put in. When should I seek immediate medical care? When you have an implanted port, you should seek immediate medical care if:  You notice a bad smell coming from the incision site.  You have swelling, redness, or drainage at the incision site.  You have more swelling or pain at the port site or the surrounding area.  You have a fever that is not controlled with medicine. This information is not intended to replace advice given to you by your health care provider. Make sure you discuss any questions you have with your health care provider. Document Released: 03/24/2005 Document Revised: 08/30/2015 Document Reviewed: 11/29/2012 Elsevier Interactive Patient Education  2017 [place] Anesthesia is a term that refers to techniques, procedures, and medicines that help a person stay safe and comfortable during a medical procedure. Monitored anesthesia care, or sedation, is one type of anesthesia. Your anesthesia specialist may recommend sedation if you will be having a procedure that does not require you to be unconscious, such as:  Cataract surgery.  A dental procedure.  A biopsy.  A colonoscopy. During the procedure, you may receive a medicine to help you relax (sedative). There are three levels of sedation:  Mild sedation. At this level, you may feel awake and relaxed. You will be able to follow directions.  Moderate sedation. At this level, you will be sleepy. You may not remember the procedure.  Deep sedation. At this level, you will be asleep. You will not remember the procedure. The more medicine you are given, the deeper your level of sedation will be. Depending on how you respond to the procedure, the anesthesia specialist may change your level of sedation or the type of anesthesia to fit your needs. An anesthesia specialist will monitor you closely during the procedure. Let your health  care provider know about:  Any allergies you have.  All medicines you are taking, including vitamins, herbs, eye drops, creams, and over-the-counter medicines.  Any use of steroids (by mouth or as a cream).  Any problems you or family members have had with sedatives and anesthetic medicines.  Any blood disorders you have.  Any surgeries you have had.  Any medical conditions you have, such as sleep apnea.  Whether you are pregnant or may be pregnant.  Any use of cigarettes, alcohol, or street drugs. What are the risks? Generally, this is a safe procedure. However, problems may occur, including:  Getting too much medicine (oversedation).  Nausea.  Allergic reaction to medicines.  Trouble breathing. If this happens, a breathing tube may be used to help with breathing. It will be removed when you are awake and breathing on your own.  Heart trouble.  Lung trouble. Before the procedure Staying hydrated  Follow instructions from your health care provider about hydration, which may include:  Up to 2 hours before the procedure - you may continue to drink clear liquids, such as water, clear fruit juice, black coffee, and plain tea. Eating and drinking restrictions  Follow instructions from your health care provider about eating and drinking,  which may include:  8 hours before the procedure - stop eating heavy meals or foods such as meat, fried foods, or fatty foods.  6 hours before the procedure - stop eating light meals or foods, such as toast or cereal.  6 hours before the procedure - stop drinking milk or drinks that contain milk.  2 hours before the procedure - stop drinking clear liquids. Medicines  Ask your health care provider about:  Changing or stopping your regular medicines. This is especially important if you are taking diabetes medicines or blood thinners.  Taking medicines such as aspirin and ibuprofen. These medicines can thin your blood. Do not take these  medicines before your procedure if your health care provider instructs you not to. Tests and exams  You will have a physical exam.  You may have blood tests done to show:  How well your kidneys and liver are working.  How well your blood can clot.  General instructions  Plan to have someone take you home from the hospital or clinic.  If you will be going home right after the procedure, plan to have someone with you for 24 hours. What happens during the procedure?  Your blood pressure, heart rate, breathing, level of pain and overall condition will be monitored.  An IV tube will be inserted into one of your veins.  Your anesthesia specialist will give you medicines as needed to keep you comfortable during the procedure. This may mean changing the level of sedation.  The procedure will be performed. After the procedure  Your blood pressure, heart rate, breathing rate, and blood oxygen level will be monitored until the medicines you were given have worn off.  Do not drive for 24 hours if you received a sedative.  You may:  Feel sleepy, clumsy, or nauseous.  Feel forgetful about what happened after the procedure.  Have a sore throat if you had a breathing tube during the procedure.  Vomit. This information is not intended to replace advice given to you by your health care provider. Make sure you discuss any questions you have with your health care provider. Document Released: 12/18/2004 Document Revised: 08/31/2015 Document Reviewed: 07/15/2015 Elsevier Interactive Patient Education  2017 [place], Care After These instructions provide you with information about caring for yourself after your procedure. Your health care provider may also give you more specific instructions. Your treatment has been planned according to current medical practices, but problems sometimes occur. Call your health care provider if you have any problems or questions  after your procedure. What can I expect after the procedure? After your procedure, it is common to:  Feel sleepy for several hours.  Feel clumsy and have poor balance for several hours.  Feel forgetful about what happened after the procedure.  Have poor judgment for several hours.  Feel nauseous or vomit.  Have a sore throat if you had a breathing tube during the procedure. Follow these instructions at home: For at least 24 hours after the procedure:    Do not:  Participate in activities in which you could fall or become injured.  Drive.  Use heavy machinery.  Drink alcohol.  Take sleeping pills or medicines that cause drowsiness.  Make important decisions or sign legal documents.  Take care of children on your own.  Rest. Eating and drinking   Follow the diet that is recommended by your health care provider.  If you vomit, drink water, juice, or soup when you can  drink without vomiting.  Make sure you have little or no nausea before eating solid foods. General instructions   Have a responsible adult stay with you until you are awake and alert.  Take over-the-counter and prescription medicines only as told by your health care provider.  If you smoke, do not smoke without supervision.  Keep all follow-up visits as told by your health care provider. This is important. Contact a health care provider if:  You keep feeling nauseous or you keep vomiting.  You feel light-headed.  You develop a rash.  You have a fever. Get help right away if:  You have trouble breathing. This information is not intended to replace advice given to you by your health care provider. Make sure you discuss any questions you have with your health care provider. Document Released: 07/15/2015 Document Revised: 11/14/2015 Document Reviewed: 07/15/2015 Elsevier Interactive Patient Education  2017 [employer].

## 2016-06-10 ENCOUNTER — Encounter (HOSPITAL_COMMUNITY): Payer: Self-pay | Admitting: Emergency Medicine

## 2016-06-10 DIAGNOSIS — E785 Hyperlipidemia, unspecified: Secondary | ICD-10-CM | POA: Diagnosis not present

## 2016-06-10 DIAGNOSIS — F17210 Nicotine dependence, cigarettes, uncomplicated: Secondary | ICD-10-CM | POA: Diagnosis not present

## 2016-06-10 DIAGNOSIS — Z79899 Other long term (current) drug therapy: Secondary | ICD-10-CM | POA: Diagnosis not present

## 2016-06-10 DIAGNOSIS — Z7982 Long term (current) use of aspirin: Secondary | ICD-10-CM | POA: Diagnosis not present

## 2016-06-10 DIAGNOSIS — J449 Chronic obstructive pulmonary disease, unspecified: Secondary | ICD-10-CM | POA: Diagnosis not present

## 2016-06-10 DIAGNOSIS — E1140 Type 2 diabetes mellitus with diabetic neuropathy, unspecified: Secondary | ICD-10-CM | POA: Diagnosis not present

## 2016-06-10 DIAGNOSIS — M1990 Unspecified osteoarthritis, unspecified site: Secondary | ICD-10-CM | POA: Diagnosis not present

## 2016-06-10 DIAGNOSIS — C3411 Malignant neoplasm of upper lobe, right bronchus or lung: Secondary | ICD-10-CM | POA: Diagnosis not present

## 2016-06-10 DIAGNOSIS — F419 Anxiety disorder, unspecified: Secondary | ICD-10-CM | POA: Diagnosis not present

## 2016-06-10 DIAGNOSIS — Z510 Encounter for antineoplastic radiation therapy: Secondary | ICD-10-CM | POA: Diagnosis not present

## 2016-06-10 DIAGNOSIS — F17200 Nicotine dependence, unspecified, uncomplicated: Secondary | ICD-10-CM | POA: Diagnosis not present

## 2016-06-10 DIAGNOSIS — I10 Essential (primary) hypertension: Secondary | ICD-10-CM | POA: Diagnosis not present

## 2016-06-10 MED ORDER — LORAZEPAM 0.5 MG PO TABS: 0.5000 mg | ORAL_TABLET | Freq: Four times a day (QID) | ORAL | 0 refills | Status: DC | PRN

## 2016-06-10 MED ORDER — ONDANSETRON HCL 8 MG PO TABS: 8.0000 mg | ORAL_TABLET | Freq: Two times a day (BID) | ORAL | 1 refills | Status: DC | PRN

## 2016-06-10 MED ORDER — PROCHLORPERAZINE MALEATE 10 MG PO TABS: 10.0000 mg | ORAL_TABLET | Freq: Four times a day (QID) | ORAL | 1 refills | Status: DC | PRN

## 2016-06-10 MED ORDER — LIDOCAINE-PRILOCAINE 2.5-2.5 % EX CREA: TOPICAL_CREAM | CUTANEOUS | 3 refills | Status: DC

## 2016-06-10 MED ORDER — DEXAMETHASONE 4 MG PO TABS: 8.0000 mg | ORAL_TABLET | Freq: Every day | ORAL | 1 refills | Status: DC

## 2016-06-10 NOTE — Patient Instructions (Signed)
[place]   CHEMOTHERAPY INSTRUCTIONS  You have been diagnosed with Stage 2b Non-Small Cell Lung Cancer.  We are going to treat you with carboplatin and taxol concurrently with radiation.  You will have 6 weekly treatments of carbo/taxol.  This treatment is with curative intent.   You will see the doctor regularly throughout treatment.  We monitor your lab work prior to every treatment.  The doctor monitors your response to treatment by the way you are feeling, your blood work, and scans periodically.   You will have the following premedications prior to receiving chemotherapy: Premeds: Benadryl:  Help prevent a reaction to the chemotherapy.  Pepcid: antihistamine premed Aloxi - high powered nausea/vomiting prevention medication used for chemotherapy patients.  Dexamethasone - steroid - given to reduce the risk of you having an allergic type reaction to the chemotherapy. Dex can cause you to feel energized, nervous/anxious/jittery, make you have trouble sleeping, and/or make you feel hot/flushed in the face/neck and/or look pink/red in the face/neck. These side effects will pass as the Dex wears off. (takes 20 minutes to infuse)  POTENTIAL SIDE EFFECTS OF TREATMENT:  Carboplatin (Generic Name) Other Names: Paraplatin, CBDCA  About This Drug Carboplatin is a drug used to treat cancer. This drug is given in the vein (IV). This drug takes about 30 minutes to infuse.   Possible Side Effects (More Common) . Nausea and throwing up (vomiting). These symptoms may happen within a few hours after your treatment and may last up to 24 hours. Medicines are available to stop or lessen these side effects. . Bone marrow depression. This is a decrease in the number of white blood cells, red blood cells, and platelets. This may raise your risk of infection, make you tired and weak (fatigue), and raise your risk of bleeding. . Soreness of the mouth and throat. You may have red  areas, white patches, or sores that hurt. . This drug may affect how your kidneys work. Your kidney function will be checked as needed. . Electrolyte changes. Your blood will be checked for electrolyte changes as needed.  Side Effects (Less Common) . Hair loss. Some patients lose their hair on the scalp and body. You may notice your hair thinning seven to 14 days after getting this drug. . Effects on the nerves are called peripheral neuropathy. You may feel numbness, tingling, or pain in your hands and feet. It may be hard for you to button your clothes, open jars, or walk as usual. The effect on the nerves may get worse with more doses of the drug. These effects get better in some people after the drug is stopped but it does not get better in all people. . Loose bowel movements (diarrhea) that may last for several days . Decreased hearing or ringing in the ears . Changes in the way food and drinks taste . Changes in liver function. Your liver function will be checked as needed.  Allergic Reactions Serious allergic reactions including anaphylaxis are rare. While you are getting this drug in your vein (IV), tell your nurse right away if you have any of these symptoms of an allergic reaction: . Trouble catching your breath . Feeling like your tongue or throat are swelling . Feeling your heart beat quickly or in a not normal way (palpitations) . Feeling dizzy or lightheaded . Flushing, itching, rash, and/or hives  Treating Side Effects . Drink 6-8 cups of fluids each day unless your doctor has told you  to limit your fluid intake due to some other health problem. A cup is 8 ounces of fluid. If you throw up or have loose bowel movements, you should drink more fluids so that you do not become dehydrated (lack water in the body from losing too much fluid). . Mouth care is very important. Your mouth care should consist of routine, gentle cleaning of your teeth or dentures and rinsing your mouth with a  mixture of 1/2 teaspoon of salt in 8 ounces of water or  teaspoon of baking soda in 8 ounces of water. This should be done at least after each meal and at bedtime. . If you have mouth sores, avoid mouthwash that has alcohol. Avoid alcohol and smoking because they can bother your mouth and throat. . If you have numbness and tingling in your hands and feet, be careful when cooking, walking, and handling sharp objects and hot liquids. . Talk with your nurse about getting a wig before you lose your hair. Also, call the [facility] at 800-ACS-2345 to find out information about the "Look Good, Feel Better" program close to where you live. It is a free program where women getting chemotherapy can learn about wigs, turbans and scarves as well as makeup techniques and skin and nail care.  Food and Drug Interactions There are no known interactions of carboplatin with food. This drug may interact with other medicines. Tell your doctor and pharmacist about all the medicines and dietary supplements (vitamins, minerals, herbs and others) that you are taking at this time. The safety and use of dietary supplements and alternative diets are often not known. Using these might affect your cancer or interfere with your treatment. Until more is known, you should not use dietary supplements or alternative diets without your doctor's help.  When to Call the Doctor Call your doctor or nurse right away if you have any of these symptoms: . Fever of 100.5 F (38 C) or above; chills . Bleeding or bruising that is not normal . Wheezing or trouble breathing . Nausea that stops you from eating or drinking . Throwing up more than once a day . Rash or itching . Loose bowel movements (diarrhea) more than four times a day or diarrhea with weakness or feeling lightheaded . Call your doctor or nurse as soon as possible if any of these symptoms happen: . Numbness, tingling, decreased feeling or weakness in fingers, toes,  arms, or legs . Change in hearing, ringing in the ears . Blurred vision or other changes in eyesight . Decreased urine . Yellowing of skin or eyes  Problems and Reproductive Concerns Sexual problems and reproduction concerns may happen. In both men and women, this drug may affect your ability to have children. This cannot be determined before your treatment. Talk with your doctor or nurse if you plan to have children. Ask for information on sperm or egg banking. In men, this drug may interfere with your ability to make sperm, but it should not change your ability to have sexual relations. In women, menstrual bleeding may become irregular or stop while you are getting this drug. Do not assume that you cannot become pregnant if you do not have a menstrual period. Women may go through signs of menopause (change of life) like vaginal dryness or itching. Vaginal lubricants can be used to lessen vaginal dryness, itching, and pain during sexual relations. Genetic counseling is available for you to talk about the effects of this drug therapy on future pregnancies.  Also, a genetic counselor can look at the possible risk of problems in the unborn baby due to this medicine if an exposure happens during pregnancy. . Pregnancy warning: This drug may have harmful effects on the unborn child, so effective methods of birth control should be used during your cancer treatment. . Breast feeding warning: It is not known if this drug passes into breast milk. For this reason, women should talk to their doctor about the risks and benefits of breast feeding during treatment with this drug because this drug may enter the breast milk and badly harm a breast feeding baby.   Paclitaxel (Taxol)  Paclitaxel is a drug used to treat cancer. It is given in the vein (IV).  This will take 1 hour to infuse.    Possible Side Effects . Hair loss. Hair loss is often temporary, although with certain medicine, hair loss can sometimes be  permanent. Hair loss may happen suddenly or gradually. If you lose hair, you may lose it from your head, face, armpits, pubic area, chest, and/or legs. You may also notice your hair getting thin. . Swelling of your legs, ankles and/or feet (edema) . Flushing . Nausea and throwing up (vomiting) . Loose bowel movements (diarrhea) . Bone marrow depression. This is a decrease in the number of white blood cells, red blood cells, and platelets. This may raise your risk of infection, make you tired and weak (fatigue), and raise your risk of bleeding. . Effects on the nerves are called peripheral neuropathy. You may feel numbness, tingling, or pain in your hands and feet. It may be hard for you to button your clothes, open jars, or walk as usual. The effect on the nerves may get worse with more doses of the drug. These effects get better in some people after the drug is stopped but it does not get better in all people. . Changes in your liver function . Bone, joint and muscle pain . Abnormal EKG . Allergic reaction: Allergic reactions, including anaphylaxis are rare but may happen in some patients. Signs of allergic reaction to this drug may be swelling of the face, feeling like your tongue or throat are swelling, trouble breathing, rash, itching, fever, chills, feeling dizzy, and/or feeling that your heart is beating in a fast or not normal way. If this happens, do not take another dose of this drug. You should get urgent medical treatment. . Infection . Changes in your kidney function. Note: Each of the side effects above was reported in 20% or greater of patients treated with paclitaxel. Not all possible side effects are included above. Warnings and Precautions . Severe bone marrow depression  Side Effects . To help with hair loss, wash with a mild shampoo and avoid washing your hair every day. . Avoid rubbing your scalp, instead, pat your hair or scalp dry . Avoid coloring your hair . Limit your  use of hair spray, electric curlers, blow dryers, and curling irons. . If you are interested in getting a wig, talk to your nurse. You can also call the [facility] at 800-ACS-2345 to find out information about the "Look Good, Feel Better" program close to where you live. It is a free program where women getting chemotherapy can learn about wigs, turbans and scarves as well as makeup techniques and skin and nail care. . Ask your doctor or nurse about medicines that are available to help stop or lessen diarrhea and/or nausea. . To help with nausea and vomiting, eat  small, frequent meals instead of three large meals a day. Choose foods and drinks that are at room temperature. Ask your nurse or doctor about other helpful tips and medicine that is available to help or stop lessen these symptoms. . If you get diarrhea, eat low-fiber foods that are high in protein and calories and avoid foods that can irritate your digestive tracts or lead to cramping. Ask your nurse or doctor about medicine that can lessen or stop your diarrhea. . Mouth care is very important. Your mouth care should consist of routine, gentle cleaning of your teeth or dentures and rinsing your mouth with a mixture of 1/2 teaspoon of salt in 8 ounces of water or  teaspoon of baking soda in 8 ounces of water. This should be done at least after each meal and at bedtime. . If you have mouth sores, avoid mouthwash that has alcohol. Also avoid alcohol and smoking because they can bother your mouth and throat. . Drink plenty of fluids (a minimum of eight glasses per day is recommended). . Take your temperature as your doctor or nurse tells you, and whenever you feel like you may have a fever. . Talk to your doctor or nurse about precautions you can take to avoid infections and bleeding. . Be careful when cooking, walking, and handling sharp objects and hot liquids.  Food and Drug Interactions . There are no known interactions of  paclitaxel with food. . This drug may interact with other medicines. Tell your doctor and pharmacist about all the medicines and dietary supplements (vitamins, minerals, herbs and others) that you are taking at this time. . The safety and use of dietary supplements and alternative diets are often not known. Using these might affect your cancer or interfere with your treatment. Until more is known, you should not use dietary supplements or alternative diets without your cancer doctor's help.  When to Call the Doctor Call your doctor or nurse if you have any of the following symptoms and/or any new or unusual symptoms: . Fever of 100.5 F (38 C) or above . Chills . Redness, pain, warmth, or swelling at the IV site during the infusion . Signs of allergic reaction: swelling of the face, feeling like your tongue or throat are swelling, trouble breathing, rash, itching, fever, chills, feeling dizzy, and/or feeling that your heart is beating in a fast or not normal way . Feeling that your heart is beating in a fast or not normal way (palpitations) . Weight gain of 5 pounds in one week (fluid retention) . Decreased urine or very dark urine . Signs of liver problems: dark urine, pale bowel movements, bad stomach pain, feeling very tired and weak, unusual itching, or yellowing of the eyes or skin . Heavy menstrual period that lasts longer than normal . Easy bruising or bleeding . Nausea that stops you from eating or drinking, and/or that is not relieved by prescribed medicines. . Loose bowel movements (diarrhea) more than 4 times a day or diarrhea with weakness or lightheadedness . Pain in your mouth or throat that makes it hard to eat or drink . Lasting loss of appetite or rapid weight loss of five pounds in a week . Signs of peripheral neuropathy: numbness, tingling, or decreased feeling in fingers or toes; trouble walking or changes in the way you walk; or feeling clumsy when buttoning clothes, opening  jars, or other routine activities . Joint and muscle pain that is not relieved by prescribed medicines . Extreme fatigue  that interferes with normal activities . While you are getting this drug, please tell your nurse right away if you have any pain, redness, or swelling at the site of the IV infusion. . If you think you are pregnant.  Reproduction Warnings . Pregnancy warning: This drug may have harmful effects on the unborn child, it is recommended that effective methods of birth control should be used during your cancer treatment. Let your doctor know right away if you think you may be pregnant. . Breast feeding warning: Women should not breast feed during treatment because this drug could enter the breast milk and cause harm to a breast feeding baby.   EDUCATIONAL MATERIALS GIVEN AND REVIEWED: Chemotherapy and you book, nutrition book, information on carboplatin and taxol.   SELF CARE ACTIVITIES WHILE ON CHEMOTHERAPY: Hydration Increase your fluid intake 48 hours prior to treatment and drink at least 8 to 12 cups (64 ounces) of water/decaff beverages per day after treatment. You can still have your cup of coffee or soda but these beverages do not count as part of your 8 to 12 cups that you need to drink daily. No alcohol intake.  Medications Continue taking your normal prescription medication as prescribed.  If you start any new herbal or new supplements please let us know first to make sure it is safe.  Mouth Care Have teeth cleaned professionally before starting treatment. Keep dentures and partial plates clean. Use soft toothbrush and do not use mouthwashes that contain alcohol. Biotene is a good mouthwash that is available at most pharmacies or may be ordered by calling [phone]. Use warm salt water gargles (1 teaspoon salt per 1 quart warm water) before and after meals and at bedtime. Or you may rinse with 2 tablespoons of three-percent hydrogen peroxide mixed in eight ounces of  water. If you are still having problems with your mouth or sores in your mouth please call the clinic. If you need dental work, please let the doctor know before you go for your appointment so that we can coordinate the best possible time for you in regards to your chemo regimen. You need to also let your dentist know that you are actively taking chemo. We may need to do labs prior to your dental appointment.   Skin Care Always use sunscreen that has not expired and with SPF (Sun Protection Factor) of 50 or higher. Wear hats to protect your head from the sun. Remember to use sunscreen on your hands, ears, face, & feet.  Use good moisturizing lotions such as udder cream, eucerin, or even Vaseline. Some chemotherapies can cause dry skin, color changes in your skin and nails.    . Avoid long, hot showers or baths. . Use gentle, fragrance-free soaps and laundry detergent. . Use moisturizers, preferably creams or ointments rather than lotions because the thicker consistency is better at preventing skin dehydration. Apply the cream or ointment within 15 minutes of showering. Reapply moisturizer at night, and moisturize your hands every time after you wash them.  Hair Loss (if your doctor says your hair will fall out)  . If your doctor says that your hair is likely to fall out, decide before you begin chemo whether you want to wear a wig. You may want to shop before treatment to match your hair color. . Hats, turbans, and scarves can also camouflage hair loss, although some people prefer to leave their heads uncovered. If you go bare-headed outdoors, be sure to use sunscreen on your scalp. Marland Kitchen  Cut your hair short. It eases the inconvenience of shedding lots of hair, but it also can reduce the emotional impact of watching your hair fall out. . Don't perm or color your hair during chemotherapy. Those chemical treatments are already damaging to hair and can enhance hair loss. Once your chemo treatments are done  and your hair has grown back, it's OK to resume dyeing or perming hair. With chemotherapy, hair loss is almost always temporary. But when it grows back, it may be a different color or texture. In older adults who still had hair color before chemotherapy, the new growth may be completely gray.  Often, new hair is very fine and soft.  Infection Prevention Please wash your hands for at least 30 seconds using warm soapy water. Handwashing is the #1 way to prevent the spread of germs. Stay away from sick people or people who are getting over a cold. If you develop respiratory systems such as green/yellow mucus production or productive cough or persistent cough let us know and we will see if you need an antibiotic. It is a good idea to keep a pair of gloves on when going into grocery stores/[employer] to decrease your risk of coming into contact with germs on the carts, etc. Carry alcohol hand gel with you at all times and use it frequently if out in public. If your temperature reaches 100.5 or higher please call the clinic and let us know.  If it is after hours or on the weekend please go to the ER if your temperature is over 100.5.  Please have your own personal thermometer at home to use.    Sex and bodily fluids If you are going to have sex, a condom must be used to protect the person that isn't taking chemotherapy. Chemo can decrease your libido (sex drive). For a few days after chemotherapy, chemotherapy can be excreted through your bodily fluids.  When using the toilet please close the lid and flush the toilet twice.  Do this for a few day after you have had chemotherapy.    Effects of chemotherapy on your sex life Some changes are simple and won't last long. They won't affect your sex life permanently. Sometimes you may feel: . too tired . not strong enough to be very active . sick or sore  . not in the mood . anxious or low Your anxiety might not seem related to sex. For example, you may be worried  about the cancer and how your treatment is going. Or you may be worried about money, or about how you family are coping with your illness. These things can cause stress, which can affect your interest in sex. It's important to talk to your partner about how you feel. Remember - the changes to your sex life don't usually last long. There's usually no medical reason to stop having sex during chemo. The drugs won't have any long term physical effects on your performance or enjoyment of sex. Cancer can't be passed on to your partner during sex  Contraception It's important to use reliable contraception during treatment. Avoid getting pregnant while you or your partner are having chemotherapy. This is because the drugs may harm the baby. Sometimes chemotherapy drugs can leave a man or woman infertile.  This means you would not be able to have children in the future. You might want to talk to someone about permanent infertility. It can be very difficult to learn that you may no longer be able to  have children. Some people find counselling helpful. There might be ways to preserve your fertility, although this is easier for men than for women. You may want to speak to a fertility expert. You can talk about sperm banking or harvesting your eggs. You can also ask about other fertility options, such as donor eggs. If you have or have had breast cancer, your doctor might advise you not to take the contraceptive pill. This is because the hormones in it might affect the cancer.  It is not known for sure whether or not chemotherapy drugs can be passed on through semen or secretions from the vagina. Because of this some doctors advise people to use a barrier method if you have sex during treatment. This applies to vaginal, anal or oral sex. Generally, doctors advise a barrier method only for the time you are actually having the treatment and for about a week after your treatment. Advice like this can be worrying, but this  does not mean that you have to avoid being intimate with your partner. You can still have close contact with your partner and continue to enjoy sex.  Animals If you have cats or birds we just ask that you not change the litter or change the cage.  Please have someone else do this for you while you are on chemotherapy.   Food Safety During and After Cancer Treatment Food safety is important for people both during and after cancer treatment. Cancer and cancer treatments, such as chemotherapy, radiation therapy, and stem cell/bone marrow transplantation, often weaken the immune system. This makes it harder for your body to protect itself from foodborne illness, also called food poisoning. Foodborne illness is caused by eating food that contains harmful bacteria, parasites, or viruses.  Foods to avoid Some foods have a higher risk of becoming tainted with bacteria. These include: Marland Kitchen Unwashed fresh fruit and vegetables, especially leafy vegetables that can hide dirt and other contaminants . Raw sprouts, such as alfalfa sprouts . Raw or undercooked beef, especially ground beef, or other raw or undercooked meat and poultry . Fatty, fried, or spicy foods immediately before or after treatment.  These can sit heavy on your stomach and make you feel nauseous. . Raw or undercooked shellfish, such as oysters. . Sushi and sashimi, which often contain raw fish.  . Unpasteurized beverages, such as unpasteurized fruit juices, raw milk, raw yogurt, or cider . Undercooked eggs, such as soft boiled, over easy, and poached; raw, unpasteurized eggs; or foods made with raw egg, such as homemade raw cookie dough and homemade mayonnaise Simple steps for food safety Shop smart. . Do not buy food stored or displayed in an unclean area. . Do not buy bruised or damaged fruits or vegetables. . Do not buy cans that have cracks, dents, or bulges. . Pick up foods that can spoil at the end of your shopping trip and store them  in a cooler on the way home. Prepare and clean up foods carefully. . Rinse all fresh fruits and vegetables under running water, and dry them with a clean towel or paper towel. . Clean the top of cans before opening them. . After preparing food, wash your hands for 20 seconds with hot water and soap. Pay special attention to areas between fingers and under nails. . Clean your utensils and dishes with hot water and soap. Marland Kitchen Disinfect your kitchen and cutting boards using 1 teaspoon of liquid, unscented bleach mixed into 1 quart of water.   Dispose  of old food. . Eat canned and packaged food before its expiration date (the "use by" or "best before" date). . Consume refrigerated leftovers within 3 to 4 days. After that time, throw out the food. Even if the food does not smell or look spoiled, it still may be unsafe. Some bacteria, such as Listeria, can grow even on foods stored in the refrigerator if they are kept for too long. Take precautions when eating out. . At restaurants, avoid buffets and salad bars where food sits out for a long time and comes in contact with many people. Food can become contaminated when someone with a virus, often a norovirus, or another "bug" handles it. . Put any leftover food in a "to-go" container yourself, rather than having the server do it. And, refrigerate leftovers as soon as you get home. . Choose restaurants that are clean and that are willing to prepare your food as you order it cooked.   MEDICATIONS: Dexamethasone 4 mg tablet:  Take 2 tablets (8 mg total) by mouth daily. Start the day after chemotherapy for 2 days.                                                                                                                                 Zofran/Ondansetron '8mg'$  tablet. Take 1 tablet every 8 hours as needed for nausea/vomiting. (#1 nausea med to take, this can constipate)  Compazine/Prochlorperazine '10mg'$  tablet. Take 1 tablet every 6 hours as needed for  nausea/vomiting. (#2 nausea med to take, this can make you sleepy)  Lorazepam (Ativan) 0.5 mg tablet:  Take 1 tablet (0.5 mg total) by mouth every 6 (six) hours as needed (Nausea or vomiting).  EMLA cream. Apply a quarter size amount to port site 1 hour prior to chemo. Do not rub in. Cover with plastic wrap.   Over-the-Counter Meds:  Miralax 1 capful in 8 oz of fluid daily. May increase to two times a day if needed. This is a stool softener. If this doesn't work proceed you can add:  Senokot S-start with 1 tablet two times a day and increase to 4 tablets two times a day if needed. (total of 8 tablets in a 24 hour period). This is a stimulant laxative.   Call us if this does not help your bowels move.   Imodium '2mg'$  capsule. Take 2 capsules after the 1st loose stool and then 1 capsule every 2 hours until you go a total of 12 hours without having a loose stool. Call the [facility] if loose stools continue. If diarrhea occurs @ bedtime, take 2 capsules @ bedtime. Then take 2 capsules every 4 hours until morning. Call [facility].    Diarrhea Sheet  If you are having loose stools/diarrhea, please purchase Imodium and begin taking as outlined:  At the first sign of poorly formed or loose stools you should begin taking Imodium(loperamide) 2 mg capsules.  Take two caplets ('4mg'$ ) followed  by one caplet ('2mg'$ ) every 2 hours until you have had no diarrhea for 12 hours.  During the night take two caplets ('4mg'$ ) at bedtime and continue every 4 hours during the night until the morning.  Stop taking Imodium only after there is no sign of diarrhea for 12 hours.    Always call the [facility] if you are having loose stools/diarrhea that you can't get under control.  Loose stools/disrrhea leads to dehydration (loss of water) in your body.  We have other options of trying to get the loose stools/diarrhea to stopped but you must let us know!    Constipation Sheet *Miralax in 8 oz of fluid daily.  May  increase to two times a day if needed.  This is a stool softener.  If this not enough to keep your bowel regular:  You can add:  *Senokot S, start with one tablet twice a day and can increase to 4 tablets twice a day if needed.  This is a stimulant laxative.   Sometimes when you take pain medication you need BOTH a medicine to keep your stool soft and a medicine to help your bowel push it out!  Please call if the above does not work for you.   Do not go more than 2 days without a bowel movement.  It is very important that you do not become constipated.  It will make you feel sick to your stomach (nausea) and can cause abdominal pain and vomiting.    Nausea Sheet  Zofran/Ondansetron '8mg'$  tablet. Take 1 tablet every 8 hours as needed for nausea/vomiting. (#1 nausea med to take, this can constipate)  Compazine/Prochlorperazine '10mg'$  tablet. Take 1 tablet every 6 hours as needed for nausea/vomiting. (#2 nausea med to take, this can make you sleepy)  You can take these medications together or separately.  We would first like for you to try the Ondansetron by itself and then take the Prochloperizine if needed. But you are allowed to take both medications at the same time if your nausea is that severe.  If you are having persistent nausea (nausea that does not stop) please take these medications on a staggered schedule so that the nausea medication stays in your body.  Please call the [facility] and let us know the amount of nausea that you are experiencing.  If you begin to vomit, you need to call the [facility] and if it is the weekend and you have vomited more than one time and cant get it to stop-go to the Emergency Room.  Persistent nausea/vomiting can lead to dehydration (loss of fluid in your body) and will make you feel terrible.   Ice chips, sips of clear liquids, foods that are @ room temperature, crackers, and toast tend to be better tolerated.    SYMPTOMS TO REPORT AS SOON AS  POSSIBLE AFTER TREATMENT:  FEVER GREATER THAN 100.5 F  CHILLS WITH OR WITHOUT FEVER  NAUSEA AND VOMITING THAT IS NOT CONTROLLED WITH YOUR NAUSEA MEDICATION  UNUSUAL SHORTNESS OF BREATH  UNUSUAL BRUISING OR BLEEDING  TENDERNESS IN MOUTH AND THROAT WITH OR WITHOUT PRESENCE OF ULCERS  URINARY PROBLEMS  BOWEL PROBLEMS  UNUSUAL RASH    Wear comfortable clothing and clothing appropriate for easy access to any Portacath or PICC line. Let us know if there is anything that we can do to make your therapy better!     What to do if you need assistance after hours or on the weekends: CALL [phone].  HOLD on the line, do not hang up.  You will hear multiple messages but at the end you will be connected with a nurse triage line.  They will contact the doctor if necessary.  Most of the time they will be able to assist you.  Do not call the hospital operator.       I have been informed and understand all of the instructions given to me and have received a copy. I have been instructed to call the clinic [phone] or my family physician as soon as possible for continued medical care, if indicated. I do not have any more questions at this time but understand that I may call the [facility] or the Patient Navigator at [phone] during office hours should I have questions or need assistance in obtaining follow-up care.

## 2016-06-10 NOTE — Progress Notes (Signed)
Chemotherapy education pulled together.  Appts made.  Ativan faxed to [facility] in [place].  Schedule faxed to [facility] in [place].

## 2016-06-11 ENCOUNTER — Encounter (HOSPITAL_COMMUNITY): Payer: Self-pay

## 2016-06-11 ENCOUNTER — Ambulatory Visit (INDEPENDENT_AMBULATORY_CARE_PROVIDER_SITE_OTHER): Payer: Medicare Other | Admitting: General Surgery

## 2016-06-11 ENCOUNTER — Encounter (HOSPITAL_COMMUNITY)
Admission: RE | Admit: 2016-06-11 | Discharge: 2016-06-11 | Disposition: A | Discharge status: Home / Self Care | Payer: Medicare Other | Source: Ambulatory Visit | Attending: General Surgery | Admitting: General Surgery

## 2016-06-11 ENCOUNTER — Encounter (HOSPITAL_COMMUNITY): Payer: Medicare Other

## 2016-06-11 ENCOUNTER — Encounter: Payer: Self-pay | Admitting: General Surgery

## 2016-06-11 VITALS — BP 204/84 | HR 94 | Temp 98.7°F | Resp 20 | Ht 66.0 in | Wt 187.0 lb

## 2016-06-11 DIAGNOSIS — C3491 Malignant neoplasm of unspecified part of right bronchus or lung: Secondary | ICD-10-CM

## 2016-06-11 DIAGNOSIS — Z7951 Long term (current) use of inhaled steroids: Secondary | ICD-10-CM | POA: Diagnosis not present

## 2016-06-11 DIAGNOSIS — E1140 Type 2 diabetes mellitus with diabetic neuropathy, unspecified: Secondary | ICD-10-CM | POA: Diagnosis not present

## 2016-06-11 DIAGNOSIS — I10 Essential (primary) hypertension: Secondary | ICD-10-CM | POA: Diagnosis not present

## 2016-06-11 DIAGNOSIS — E785 Hyperlipidemia, unspecified: Secondary | ICD-10-CM | POA: Diagnosis not present

## 2016-06-11 DIAGNOSIS — I2510 Atherosclerotic heart disease of native coronary artery without angina pectoris: Secondary | ICD-10-CM

## 2016-06-11 DIAGNOSIS — F419 Anxiety disorder, unspecified: Secondary | ICD-10-CM | POA: Diagnosis not present

## 2016-06-11 DIAGNOSIS — J449 Chronic obstructive pulmonary disease, unspecified: Secondary | ICD-10-CM | POA: Diagnosis not present

## 2016-06-11 DIAGNOSIS — Z791 Long term (current) use of non-steroidal anti-inflammatories (NSAID): Secondary | ICD-10-CM | POA: Diagnosis not present

## 2016-06-11 DIAGNOSIS — Z79899 Other long term (current) drug therapy: Secondary | ICD-10-CM | POA: Diagnosis not present

## 2016-06-11 DIAGNOSIS — Z9989 Dependence on other enabling machines and devices: Secondary | ICD-10-CM | POA: Diagnosis not present

## 2016-06-11 DIAGNOSIS — Z7982 Long term (current) use of aspirin: Secondary | ICD-10-CM | POA: Diagnosis not present

## 2016-06-11 DIAGNOSIS — F17210 Nicotine dependence, cigarettes, uncomplicated: Secondary | ICD-10-CM | POA: Diagnosis not present

## 2016-06-11 DIAGNOSIS — G4730 Sleep apnea, unspecified: Secondary | ICD-10-CM | POA: Diagnosis not present

## 2016-06-11 HISTORY — DX: Heart failure, unspecified: I50.9

## 2016-06-11 HISTORY — DX: Unspecified macular degeneration: H35.30

## 2016-06-11 HISTORY — DX: Personal history of urinary calculi: Z87.442

## 2016-06-11 NOTE — Progress Notes (Signed)
John [name]; [id number]; 04/09/1945   HPI Patient is a 71 year old white male with non-small cell lung carcinoma, right. He is about to undergo chemotherapy and oncology has referred the patient for Port-A-Cath insertion. The patient has no pain at this time. He is seeing a cardiologist tomorrow concerning his cardiac status. Past Medical History:  Diagnosis Date  . Adenocarcinoma of lung, right ([place]) 04/18/2016  . Anxiety   . Arthritis   . COPD (chronic obstructive pulmonary disease) ([place])   . Depression   . Diabetes mellitus without complication (HCC)    no meds  . Dyspnea   . Hyperlipidemia   . Hypertension   . Neuropathy ([place])   . Non-small cell lung cancer, right ([place] [facility]) 04/18/2016  . Prostatitis   . Sleep apnea    cpap    Past Surgical History:  Procedure Laterality Date  . NO PAST SURGERIES    . VIDEO BRONCHOSCOPY WITH ENDOBRONCHIAL NAVIGATION N/A 05/28/2016   Procedure: VIDEO BRONCHOSCOPY WITH ENDOBRONCHIAL NAVIGATION;  Surgeon: [name], MD;  Location: [facility];  Service: Thoracic;  Laterality: N/A;  . VIDEO BRONCHOSCOPY WITH ENDOBRONCHIAL ULTRASOUND N/A 05/28/2016   Procedure: VIDEO BRONCHOSCOPY WITH ENDOBRONCHIAL ULTRASOUND;  Surgeon: [name], MD;  Location: [facility];  Service: Thoracic;  Laterality: N/A;    Family History  Problem Relation Age of Onset  . Hypertension Mother   . Diabetes Father   . Heart disease Father   . Hypertension Sister     Current Outpatient Prescriptions on File Prior to Visit  Medication Sig Dispense Refill  . albuterol (PROVENTIL HFA;VENTOLIN HFA) 108 (90 Base) MCG/ACT inhaler Inhale 2 puffs into the lungs every 6 (six) hours as needed for wheezing or shortness of breath. 1 Inhaler 2  . aspirin EC 81 MG tablet Take 81 mg by mouth daily.    . cloNIDine (CATAPRES) 0.1 MG tablet Take 1 tablet (0.1 mg total) by mouth 3 (three) times daily. (Patient taking differently: Take 0.1 mg by mouth 3 (three) times daily as needed  (for high blood pressure). ) 90 tablet 3  . finasteride (PROSCAR) 5 MG tablet TAKE 1 TABLET EVERY DAY 30 tablet 11  . fluticasone (FLONASE) 50 MCG/ACT nasal spray Place 2 sprays into both nostrils daily. (Patient taking differently: Place 2 sprays into both nostrils at bedtime as needed for allergies. ) 16 g 6  . gabapentin (NEURONTIN) 300 MG capsule Take 300-600 mg by mouth 2 (two) times daily. 1 tab in am, 2 tab in pm    . INCRUSE ELLIPTA 62.5 MCG/INH AEPB INHALE 1 PUFF INTO THE LUNGS DAILY. 30 each 2  . irbesartan-hydrochlorothiazide (AVALIDE) 300-12.5 MG tablet Take 1 tablet by mouth daily. 90 tablet 1  . LORazepam (ATIVAN) 0.5 MG tablet Take 1 tablet (0.5 mg total) by mouth 2 (two) times daily as needed for anxiety. 30 tablet 1  . meloxicam (MOBIC) 7.5 MG tablet TAKE 1 TABLET EVERY DAY FOR BACK PAIN (Patient taking differently: TAKE 1 TABLET EVERY DAILY AS NEEDED FOR BACK PAIN) 90 tablet 0  . metoprolol succinate (TOPROL-XL) 50 MG 24 hr tablet Take 1 tablet (50 mg total) by mouth daily. Take with or immediately following a meal. (Patient taking differently: Take 50 mg by mouth daily after supper. Take with or immediately following a meal.) 90 tablet 3  . pravastatin (PRAVACHOL) 40 MG tablet Take 40 mg by mouth every 3 (three) days.     . tamsulosin (FLOMAX) 0.4 MG CAPS capsule Take 0.4 mg by  mouth daily after breakfast.     No current facility-administered medications on file prior to visit.     Allergies  Allergen Reactions  . No Known Allergies     History  Alcohol Use No    History  Smoking Status  . Current Every Day Smoker  . Packs/day: 1.00  . Years: 55.00  . Types: Cigarettes  . Start date: 03/11/1961  Smokeless Tobacco  . Never Used    Review of Systems  Constitutional: Positive for chills.  HENT: Negative.   Eyes: Positive for blurred vision.  Respiratory: Positive for cough, shortness of breath and wheezing.   Cardiovascular: Negative.   Gastrointestinal:  Positive for heartburn.  Genitourinary: Positive for frequency.  Musculoskeletal: Positive for back pain and neck pain.  Skin: Negative.   Neurological: Positive for weakness.  Endo/Heme/Allergies: Negative.   Psychiatric/Behavioral: Negative.   All other systems reviewed and are negative.   Objective   Vitals:   06/11/16 1346  BP: (!) 204/84  Pulse: 94  Resp: 20  Temp: 98.7 F (37.1 C)    Physical Exam  Constitutional: He is oriented to person, place, and time and well-developed, well-nourished, and in no distress.  HENT:  Head: Normocephalic and atraumatic.  Neck: Normal range of motion. Neck supple.  Cardiovascular: Normal rate, regular rhythm and normal heart sounds.   Pulmonary/Chest: Effort normal. No respiratory distress. He has no wheezes.  Breath sounds distant.  Neurological: He is alert and oriented to person, place, and time.  Skin: Skin is warm and dry.  Vitals reviewed.   Assessment  Right lung carcinoma, need for central venous access Plan   Scheduled for Port-A-Cath insertion on 06/13/2016. The risks and benefits of the procedure including bleeding, infection, and pneumothorax were fully explained to the patient, who gave informed consent. The patient will be seeing cardiology tomorrow. We will recheck blood pressure at time of surgery.

## 2016-06-11 NOTE — Progress Notes (Signed)
Chemotherapy education completed.  Extensive teaching packet given.  Consent signed.  Handicap plaqued filled out and given.

## 2016-06-11 NOTE — H&P (Signed)
John [name]; [id number]; 1945/11/30   HPI Patient is a 71 year old white male with non-small cell lung carcinoma, right. He is about to undergo chemotherapy and oncology has referred the patient for Port-A-Cath insertion. The patient has no pain at this time. He is seeing a cardiologist tomorrow concerning his cardiac status.     Past Medical History:  Diagnosis Date  . Adenocarcinoma of lung, right ([facility]) 04/18/2016  . Anxiety   . Arthritis   . COPD (chronic obstructive pulmonary disease) ([place])   . Depression   . Diabetes mellitus without complication (HCC)    no meds  . Dyspnea   . Hyperlipidemia   . Hypertension   . Neuropathy ([place] [place])   . Non-small cell lung cancer, right ([facility]) 04/18/2016  . Prostatitis   . Sleep apnea    cpap         Past Surgical History:  Procedure Laterality Date  . NO PAST SURGERIES    . VIDEO BRONCHOSCOPY WITH ENDOBRONCHIAL NAVIGATION N/A 05/28/2016   Procedure: VIDEO BRONCHOSCOPY WITH ENDOBRONCHIAL NAVIGATION;  Surgeon: [name], MD;  Location: [facility];  Service: Thoracic;  Laterality: N/A;  . VIDEO BRONCHOSCOPY WITH ENDOBRONCHIAL ULTRASOUND N/A 05/28/2016   Procedure: VIDEO BRONCHOSCOPY WITH ENDOBRONCHIAL ULTRASOUND;  Surgeon: [name], MD;  [facility];  Service: Thoracic;  Laterality: N/A;         Family History  Problem Relation Age of Onset  . Hypertension Mother   . Diabetes Father   . Heart disease Father   . Hypertension Sister           Current Outpatient Prescriptions on File Prior to Visit  Medication Sig Dispense Refill  . albuterol (PROVENTIL HFA;VENTOLIN HFA) 108 (90 Base) MCG/ACT inhaler Inhale 2 puffs into the lungs every 6 (six) hours as needed for wheezing or shortness of breath. 1 Inhaler 2  . aspirin EC 81 MG tablet Take 81 mg by mouth daily.    . cloNIDine (CATAPRES) 0.1 MG tablet Take 1 tablet (0.1 mg total) by mouth 3 (three) times daily. (Patient taking differently:  Take 0.1 mg by mouth 3 (three) times daily as needed (for high blood pressure). ) 90 tablet 3  . finasteride (PROSCAR) 5 MG tablet TAKE 1 TABLET EVERY DAY 30 tablet 11  . fluticasone (FLONASE) 50 MCG/ACT nasal spray Place 2 sprays into both nostrils daily. (Patient taking differently: Place 2 sprays into both nostrils at bedtime as needed for allergies. ) 16 g 6  . gabapentin (NEURONTIN) 300 MG capsule Take 300-600 mg by mouth 2 (two) times daily. 1 tab in am, 2 tab in pm    . INCRUSE ELLIPTA 62.5 MCG/INH AEPB INHALE 1 PUFF INTO THE LUNGS DAILY. 30 each 2  . irbesartan-hydrochlorothiazide (AVALIDE) 300-12.5 MG tablet Take 1 tablet by mouth daily. 90 tablet 1  . LORazepam (ATIVAN) 0.5 MG tablet Take 1 tablet (0.5 mg total) by mouth 2 (two) times daily as needed for anxiety. 30 tablet 1  . meloxicam (MOBIC) 7.5 MG tablet TAKE 1 TABLET EVERY DAY FOR BACK PAIN (Patient taking differently: TAKE 1 TABLET EVERY DAILY AS NEEDED FOR BACK PAIN) 90 tablet 0  . metoprolol succinate (TOPROL-XL) 50 MG 24 hr tablet Take 1 tablet (50 mg total) by mouth daily. Take with or immediately following a meal. (Patient taking differently: Take 50 mg by mouth daily after supper. Take with or immediately following a meal.) 90 tablet 3  . pravastatin (PRAVACHOL) 40 MG tablet Take 40 mg by  mouth every 3 (three) days.     . tamsulosin (FLOMAX) 0.4 MG CAPS capsule Take 0.4 mg by mouth daily after breakfast.     No current facility-administered medications on file prior to visit.         Allergies  Allergen Reactions  . No Known Allergies        History  Alcohol Use No        History  Smoking Status  . Current Every Day Smoker  . Packs/day: 1.00  . Years: 55.00  . Types: Cigarettes  . Start date: 03/11/1961  Smokeless Tobacco  . Never Used    Review of Systems  Constitutional: Positive for chills.  HENT: Negative.   Eyes: Positive for blurred vision.  Respiratory: Positive for cough,  shortness of breath and wheezing.   Cardiovascular: Negative.   Gastrointestinal: Positive for heartburn.  Genitourinary: Positive for frequency.  Musculoskeletal: Positive for back pain and neck pain.  Skin: Negative.   Neurological: Positive for weakness.  Endo/Heme/Allergies: Negative.   Psychiatric/Behavioral: Negative.   All other systems reviewed and are negative.   Objective      Vitals:   06/11/16 1346  BP: (!) 204/84  Pulse: 94  Resp: 20  Temp: 98.7 F (37.1 C)    Physical Exam  Constitutional: He is oriented to person, place, and time and well-developed, well-nourished, and in no distress.  HENT:  Head: Normocephalic and atraumatic.  Neck: Normal range of motion. Neck supple.  Cardiovascular: Normal rate, regular rhythm and normal heart sounds.   Pulmonary/Chest: Effort normal. No respiratory distress. He has no wheezes.  Breath sounds distant.  Neurological: He is alert and oriented to person, place, and time.  Skin: Skin is warm and dry.  Vitals reviewed.   Assessment  Right lung carcinoma, need for central venous access Plan   Scheduled for Port-A-Cath insertion on 06/13/2016. The risks and benefits of the procedure including bleeding, infection, and pneumothorax were fully explained to the patient, who gave informed consent. The patient will be seeing cardiology tomorrow. We will recheck blood pressure at time of surgery.

## 2016-06-11 NOTE — Patient Instructions (Signed)
Implanted Port Home Guide An implanted port is a type of central line that is placed under the skin. Central lines are used to provide IV access when treatment or nutrition needs to be given through a person's veins. Implanted ports are used for long-term IV access. An implanted port may be placed because:  You need IV medicine that would be irritating to the small veins in your hands or arms.  You need long-term IV medicines, such as antibiotics.  You need IV nutrition for a long period.  You need frequent blood draws for lab tests.  You need dialysis.  Implanted ports are usually placed in the chest area, but they can also be placed in the upper arm, the abdomen, or the leg. An implanted port has two main parts:  Reservoir. The reservoir is round and will appear as a small, raised area under your skin. The reservoir is the part where a needle is inserted to give medicines or draw blood.  Catheter. The catheter is a thin, flexible tube that extends from the reservoir. The catheter is placed into a large vein. Medicine that is inserted into the reservoir goes into the catheter and then into the vein.  How will I care for my incision site? Do not get the incision site wet. Bathe or shower as directed by your health care provider. How is my port accessed? Special steps must be taken to access the port:  Before the port is accessed, a numbing cream can be placed on the skin. This helps numb the skin over the port site.  Your health care provider uses a sterile technique to access the port. ? Your health care provider must put on a mask and sterile gloves. ? The skin over your port is cleaned carefully with an antiseptic and allowed to dry. ? The port is gently pinched between sterile gloves, and a needle is inserted into the port.  Only "non-coring" port needles should be used to access the port. Once the port is accessed, a blood return should be checked. This helps ensure that the port  is in the vein and is not clogged.  If your port needs to remain accessed for a constant infusion, a clear (transparent) bandage will be placed over the needle site. The bandage and needle will need to be changed every week, or as directed by your health care provider.  Keep the bandage covering the needle clean and dry. Do not get it wet. Follow your health care provider's instructions on how to take a shower or bath while the port is accessed.  If your port does not need to stay accessed, no bandage is needed over the port.  What is flushing? Flushing helps keep the port from getting clogged. Follow your health care provider's instructions on how and when to flush the port. Ports are usually flushed with saline solution or a medicine called heparin. The need for flushing will depend on how the port is used.  If the port is used for intermittent medicines or blood draws, the port will need to be flushed: ? After medicines have been given. ? After blood has been drawn. ? As part of routine maintenance.  If a constant infusion is running, the port may not need to be flushed.  How long will my port stay implanted? The port can stay in for as long as your health care provider thinks it is needed. When it is time for the port to come out, surgery will be   done to remove it. The procedure is similar to the one performed when the port was put in. When should I seek immediate medical care? When you have an implanted port, you should seek immediate medical care if:  You notice a bad smell coming from the incision site.  You have swelling, redness, or drainage at the incision site.  You have more swelling or pain at the port site or the surrounding area.  You have a fever that is not controlled with medicine.  This information is not intended to replace advice given to you by your health care provider. Make sure you discuss any questions you have with your health care provider. Document  Released: 03/24/2005 Document Revised: 08/30/2015 Document Reviewed: 11/29/2012 Elsevier Interactive Patient Education  2017 [employer]  

## 2016-06-12 ENCOUNTER — Ambulatory Visit (INDEPENDENT_AMBULATORY_CARE_PROVIDER_SITE_OTHER): Payer: Medicare Other | Admitting: Cardiovascular Disease

## 2016-06-12 ENCOUNTER — Encounter: Payer: Self-pay | Admitting: Cardiovascular Disease

## 2016-06-12 VITALS — BP 178/74 | HR 91 | Ht 66.0 in | Wt 190.0 lb

## 2016-06-12 DIAGNOSIS — I3139 Other pericardial effusion (noninflammatory): Secondary | ICD-10-CM

## 2016-06-12 DIAGNOSIS — E7800 Pure hypercholesterolemia, unspecified: Secondary | ICD-10-CM

## 2016-06-12 DIAGNOSIS — I313 Pericardial effusion (noninflammatory): Secondary | ICD-10-CM | POA: Diagnosis not present

## 2016-06-12 DIAGNOSIS — R079 Chest pain, unspecified: Secondary | ICD-10-CM

## 2016-06-12 DIAGNOSIS — R9439 Abnormal result of other cardiovascular function study: Secondary | ICD-10-CM

## 2016-06-12 DIAGNOSIS — R0609 Other forms of dyspnea: Secondary | ICD-10-CM

## 2016-06-12 DIAGNOSIS — I10 Essential (primary) hypertension: Secondary | ICD-10-CM

## 2016-06-12 DIAGNOSIS — I2510 Atherosclerotic heart disease of native coronary artery without angina pectoris: Secondary | ICD-10-CM

## 2016-06-12 MED ORDER — CEFAZOLIN SODIUM-DEXTROSE 2-4 GM/100ML-% IV SOLN
2.0000 g | INTRAVENOUS | Status: AC
Start: 2016-06-13 — End: 2016-06-13
  Administered 2016-06-13: 2 g via INTRAVENOUS
  Filled 2016-06-12: qty 100

## 2016-06-12 NOTE — Progress Notes (Signed)
SUBJECTIVE: The patient returns for follow-up after undergoing cardiovascular testing performed for the evaluation of chest pain and exertional dyspnea.  Nuclear stress test on 05/05/16 was low risk, with a very small, mild intensity, partially reversible inferior defect suggesting a small ischemic territory.  Echocardiogram 05/05/16 showed vigorous left ventricle systolic function, LVEF 19-50%, mild LVH, grade 1 diastolic dysfunction, and a moderate pericardial effusion with no clear diagnostic evidence for tamponade physiology.  A repeat limited echocardiogram on 05/14/16 showed a small to moderate circumferential pericardial effusion with no evidence of tamponade physiology.  He has non-smell cell lung carcinoma of the right lung with hilar and paratracheal lymphadenopathy and will soon be undergoing both chemotherapy and radiation treatment.  Review of Systems: As per "subjective", otherwise negative.  No Known Allergies  Current Outpatient Prescriptions  Medication Sig Dispense Refill  . albuterol (PROVENTIL HFA;VENTOLIN HFA) 108 (90 Base) MCG/ACT inhaler Inhale 2 puffs into the lungs every 6 (six) hours as needed for wheezing or shortness of breath. 1 Inhaler 2  . aspirin EC 81 MG tablet Take 81 mg by mouth daily.    . cloNIDine (CATAPRES) 0.1 MG tablet Take 1 tablet (0.1 mg total) by mouth 3 (three) times daily. 90 tablet 3  . dexamethasone (DECADRON) 4 MG tablet Take 2 tablets (8 mg total) by mouth daily. Start the day after chemotherapy for 2 days. 30 tablet 1  . finasteride (PROSCAR) 5 MG tablet TAKE 1 TABLET EVERY DAY 30 tablet 11  . fluticasone (FLONASE) 50 MCG/ACT nasal spray Place 2 sprays into both nostrils daily. (Patient taking differently: Place 2 sprays into both nostrils at bedtime as needed for allergies. ) 16 g 6  . gabapentin (NEURONTIN) 300 MG capsule Take 300-600 mg by mouth 2 (two) times daily. Pt takes one capsule in the morning and two at bedtime.    . INCRUSE  ELLIPTA 62.5 MCG/INH AEPB INHALE 1 PUFF INTO THE LUNGS DAILY. 30 each 2  . irbesartan-hydrochlorothiazide (AVALIDE) 300-12.5 MG tablet Take 1 tablet by mouth daily. 90 tablet 1  . lidocaine-prilocaine (EMLA) cream Apply to affected area once 30 g 3  . LORazepam (ATIVAN) 0.5 MG tablet Take 1 tablet (0.5 mg total) by mouth 2 (two) times daily as needed for anxiety. 30 tablet 1  . LORazepam (ATIVAN) 0.5 MG tablet Take 1 tablet (0.5 mg total) by mouth every 6 (six) hours as needed (Nausea or vomiting). 30 tablet 0  . meloxicam (MOBIC) 7.5 MG tablet TAKE 1 TABLET EVERY DAY FOR BACK PAIN (Patient taking differently: TAKE 1 TABLET DAILY AS NEEDED FOR BACK PAIN) 90 tablet 0  . metoprolol succinate (TOPROL-XL) 50 MG 24 hr tablet Take 1 tablet (50 mg total) by mouth daily. Take with or immediately following a meal. (Patient taking differently: Take 50 mg by mouth at bedtime. Take with or immediately following a meal.) 90 tablet 3  . ondansetron (ZOFRAN) 8 MG tablet Take 1 tablet (8 mg total) by mouth 2 (two) times daily as needed for refractory nausea / vomiting. Start on day 3 after chemo. 30 tablet 1  . pravastatin (PRAVACHOL) 40 MG tablet Take 40 mg by mouth every 3 (three) days.     . prochlorperazine (COMPAZINE) 10 MG tablet Take 1 tablet (10 mg total) by mouth every 6 (six) hours as needed (Nausea or vomiting). 30 tablet 1  . tamsulosin (FLOMAX) 0.4 MG CAPS capsule Take 0.4 mg by mouth daily after breakfast.     No current  facility-administered medications for this visit.     Past Medical History:  Diagnosis Date  . Adenocarcinoma of lung, right ([facility]) 04/18/2016  . Anxiety   . Arthritis   . CHF (congestive heart failure) ([facility])   . COPD (chronic obstructive pulmonary disease) ([facility])   . Depression   . Diabetes mellitus without complication ([facility])    no meds  . Dyspnea   . History of kidney stones   . Hyperlipidemia   . Hypertension   . Macular degeneration   . Neuropathy ([facility])   . Non-small  cell lung cancer, right ([facility]) 04/18/2016  . Prostatitis   . Sleep apnea    cpap    Past Surgical History:  Procedure Laterality Date  . CATARACT EXTRACTION, BILATERAL Bilateral   . NO PAST SURGERIES    . VIDEO BRONCHOSCOPY WITH ENDOBRONCHIAL NAVIGATION N/A 05/28/2016   Procedure: VIDEO BRONCHOSCOPY WITH ENDOBRONCHIAL NAVIGATION;  Surgeon: [name], MD;  Location: [facility];  Service: Thoracic;  Laterality: N/A;  . VIDEO BRONCHOSCOPY WITH ENDOBRONCHIAL ULTRASOUND N/A 05/28/2016   Procedure: VIDEO BRONCHOSCOPY WITH ENDOBRONCHIAL ULTRASOUND;  Surgeon: [name], MD;  Location: [facility];  Service: Thoracic;  Laterality: N/A;    Social History   Social History  . Marital status: Single    Spouse name: N/A  . Number of children: N/A  . Years of education: N/A   Occupational History  . Not on file.   Social History Main Topics  . Smoking status: Current Every Day Smoker    Packs/day: 0.50    Years: 55.00    Types: Cigarettes    Start date: 03/11/1961  . Smokeless tobacco: Never Used  . Alcohol use No  . Drug use: No  . Sexual activity: Yes    Birth control/ protection: None   Other Topics Concern  . Not on file   Social History Narrative  . No narrative on file     Vitals:   06/12/16 1318  BP: (!) 178/74  Pulse: 91  SpO2: 93%  Weight: 190 lb (86.2 kg)  Height: '5\' 6"'$  (1.676 m)    PHYSICAL EXAM General: NAD HEENT: Normal. Neck: No JVD, no thyromegaly. Lungs: Clear to auscultation bilaterally with normal respiratory effort. CV: Nondisplaced PMI.  Regular rate and rhythm, normal S1/S2, no S3/S4, no murmur. No pretibial or periankle edema.    Abdomen: Soft, nontender, no distention.  Neurologic: Alert and oriented.  Psych: Normal affect. Skin: Normal. Musculoskeletal: No gross deformities.    ECG: Most recent ECG reviewed.      ASSESSMENT AND PLAN: 1. Chest pain and exertional dyspnea: Quite possibly related to primary bronchogenic  carcinoma of bilateral upper lobes. Lexiscan Myoview stress test was low risk with small area of inferior ischemia. Will manage medically. Continue ASA, beta blocker, and statin. Cardiovascular risk factors include hypertension, hyperlipidemia, and tobacco abuse. I will also monitor his pericardial effusion.  2. Malignant hypertension: Elevated. SBP's 150-170 mmHg at home. If it remains elevated, I will start amlodipine 5 mg daily.  3. Hyperlipidemia: On pravastatin.  4. Tobacco abuse disorder  5. Pericardial effusion: Will repeat a limited echo in 3 months, small to moderate in size. Likely malignant.  6. Non small cell lung CA: Will be undergoing chemotherapy.   Dispo: fu 4 months   [name], M.D., F.A.C.C.

## 2016-06-12 NOTE — Patient Instructions (Addendum)
Medication Instructions:  Continue all current medications.  Labwork: none  Testing/Procedures: Your physician has requested that you have a limited echocardiogram. Echocardiography is a painless test that uses sound waves to create images of your heart. It provides your doctor with information about the size and shape of your heart and how well your heart's chambers and valves are working. This procedure takes approximately one hour. There are no restrictions for this procedure. - due in June.   Office will contact with results via phone or letter.    Follow-Up: Your physician wants you to follow up in:  4 months.  You will receive a reminder letter in the mail one-two months in advance.  If you don't receive a letter, please call our office to schedule the follow up appointment   Any Other Special Instructions Will Be Listed Below (If Applicable).  If you need a refill on your cardiac medications before your next appointment, please call your pharmacy.

## 2016-06-13 ENCOUNTER — Encounter (HOSPITAL_COMMUNITY): Payer: Self-pay | Admitting: *Deleted

## 2016-06-13 ENCOUNTER — Encounter (HOSPITAL_COMMUNITY)
Admission: RE | Disposition: A | Discharge status: Home / Self Care | Payer: Self-pay | Source: Ambulatory Visit | Attending: General Surgery

## 2016-06-13 ENCOUNTER — Ambulatory Visit (HOSPITAL_COMMUNITY): Payer: Medicare Other

## 2016-06-13 ENCOUNTER — Ambulatory Visit (HOSPITAL_COMMUNITY): Payer: Medicare Other | Admitting: Anesthesiology

## 2016-06-13 ENCOUNTER — Ambulatory Visit (HOSPITAL_COMMUNITY)
Admission: RE | Admit: 2016-06-13 | Discharge: 2016-06-13 | Disposition: A | Discharge status: Home / Self Care | Payer: Medicare Other | Source: Ambulatory Visit | Attending: General Surgery | Admitting: General Surgery

## 2016-06-13 DIAGNOSIS — F17210 Nicotine dependence, cigarettes, uncomplicated: Secondary | ICD-10-CM | POA: Insufficient documentation

## 2016-06-13 DIAGNOSIS — J449 Chronic obstructive pulmonary disease, unspecified: Secondary | ICD-10-CM | POA: Diagnosis not present

## 2016-06-13 DIAGNOSIS — Z95828 Presence of other vascular implants and grafts: Secondary | ICD-10-CM

## 2016-06-13 DIAGNOSIS — Z79899 Other long term (current) drug therapy: Secondary | ICD-10-CM | POA: Insufficient documentation

## 2016-06-13 DIAGNOSIS — G4730 Sleep apnea, unspecified: Secondary | ICD-10-CM | POA: Diagnosis not present

## 2016-06-13 DIAGNOSIS — Z7982 Long term (current) use of aspirin: Secondary | ICD-10-CM | POA: Diagnosis not present

## 2016-06-13 DIAGNOSIS — Z9989 Dependence on other enabling machines and devices: Secondary | ICD-10-CM | POA: Diagnosis not present

## 2016-06-13 DIAGNOSIS — F419 Anxiety disorder, unspecified: Secondary | ICD-10-CM | POA: Insufficient documentation

## 2016-06-13 DIAGNOSIS — C3491 Malignant neoplasm of unspecified part of right bronchus or lung: Secondary | ICD-10-CM | POA: Diagnosis not present

## 2016-06-13 DIAGNOSIS — E1140 Type 2 diabetes mellitus with diabetic neuropathy, unspecified: Secondary | ICD-10-CM | POA: Insufficient documentation

## 2016-06-13 DIAGNOSIS — E785 Hyperlipidemia, unspecified: Secondary | ICD-10-CM | POA: Diagnosis not present

## 2016-06-13 DIAGNOSIS — Z452 Encounter for adjustment and management of vascular access device: Secondary | ICD-10-CM | POA: Diagnosis not present

## 2016-06-13 DIAGNOSIS — Z791 Long term (current) use of non-steroidal anti-inflammatories (NSAID): Secondary | ICD-10-CM | POA: Insufficient documentation

## 2016-06-13 DIAGNOSIS — I10 Essential (primary) hypertension: Secondary | ICD-10-CM | POA: Insufficient documentation

## 2016-06-13 DIAGNOSIS — C3490 Malignant neoplasm of unspecified part of unspecified bronchus or lung: Secondary | ICD-10-CM | POA: Diagnosis not present

## 2016-06-13 DIAGNOSIS — Z7951 Long term (current) use of inhaled steroids: Secondary | ICD-10-CM | POA: Diagnosis not present

## 2016-06-13 HISTORY — PX: PORTACATH PLACEMENT: SHX2246

## 2016-06-13 LAB — GLUCOSE, CAPILLARY: GLUCOSE-CAPILLARY: 174 mg/dL — AB (ref 65–99)

## 2016-06-13 SURGERY — INSERTION, TUNNELED CENTRAL VENOUS DEVICE, WITH PORT
Anesthesia: Monitor Anesthesia Care | Site: Chest | Laterality: Left

## 2016-06-13 MED ORDER — CHLORHEXIDINE GLUCONATE CLOTH 2 % EX PADS: 6.0000 | MEDICATED_PAD | Freq: Once | CUTANEOUS | Status: DC

## 2016-06-13 MED ORDER — MIDAZOLAM HCL 5 MG/5ML IJ SOLN
INTRAMUSCULAR | Status: DC | PRN
  Administered 2016-06-13 (×2): 1 mg via INTRAVENOUS

## 2016-06-13 MED ORDER — LIDOCAINE HCL (PF) 1 % IJ SOLN
INTRAMUSCULAR | Status: DC | PRN
  Administered 2016-06-13: 7 mL

## 2016-06-13 MED ORDER — MIDAZOLAM HCL 2 MG/2ML IJ SOLN
INTRAMUSCULAR | Status: AC
  Filled 2016-06-13: qty 2

## 2016-06-13 MED ORDER — LIDOCAINE HCL (PF) 1 % IJ SOLN
INTRAMUSCULAR | Status: AC
  Filled 2016-06-13: qty 30

## 2016-06-13 MED ORDER — PROPOFOL 500 MG/50ML IV EMUL
INTRAVENOUS | Status: DC | PRN
  Administered 2016-06-13: 15 ug/kg/min via INTRAVENOUS

## 2016-06-13 MED ORDER — HYDROCODONE-ACETAMINOPHEN 5-325 MG PO TABS: 1.0000 | ORAL_TABLET | Freq: Four times a day (QID) | ORAL | 0 refills | Status: DC | PRN

## 2016-06-13 MED ORDER — SODIUM CHLORIDE 0.9 % IV SOLN
INTRAVENOUS | Status: DC | PRN
  Administered 2016-06-13: 500 mL via INTRAMUSCULAR

## 2016-06-13 MED ORDER — KETOROLAC TROMETHAMINE 30 MG/ML IJ SOLN
30.0000 mg | Freq: Once | INTRAMUSCULAR | Status: AC
  Administered 2016-06-13: 30 mg via INTRAVENOUS
  Filled 2016-06-13: qty 1

## 2016-06-13 MED ORDER — MIDAZOLAM HCL 2 MG/2ML IJ SOLN
1.0000 mg | INTRAMUSCULAR | Status: AC
  Administered 2016-06-13: 2 mg via INTRAVENOUS

## 2016-06-13 MED ORDER — HEPARIN SOD (PORK) LOCK FLUSH 100 UNIT/ML IV SOLN
INTRAVENOUS | Status: AC
  Filled 2016-06-13: qty 5

## 2016-06-13 MED ORDER — HEPARIN SOD (PORK) LOCK FLUSH 100 UNIT/ML IV SOLN
INTRAVENOUS | Status: DC | PRN
  Administered 2016-06-13: 500 [IU] via INTRAVENOUS

## 2016-06-13 MED ORDER — LACTATED RINGERS IV SOLN
INTRAVENOUS | Status: DC
  Administered 2016-06-13: 1000 mL via INTRAVENOUS

## 2016-06-13 SURGICAL SUPPLY — 34 items
APPLIER CLIP 9.375 SM OPEN (CLIP)
BAG DECANTER FOR FLEXI CONT (MISCELLANEOUS) ×2 IMPLANT
BAG HAMPER (MISCELLANEOUS) ×2 IMPLANT
CHLORAPREP W/TINT 10.5 ML (MISCELLANEOUS) ×2 IMPLANT
CLIP APPLIE 9.375 SM OPEN (CLIP) IMPLANT
CLOTH BEACON ORANGE TIMEOUT ST (SAFETY) ×2 IMPLANT
COVER LIGHT HANDLE STERIS (MISCELLANEOUS) ×4 IMPLANT
DECANTER SPIKE VIAL GLASS SM (MISCELLANEOUS) ×2 IMPLANT
DERMABOND ADVANCED (GAUZE/BANDAGES/DRESSINGS) ×1
DERMABOND ADVANCED .7 DNX12 (GAUZE/BANDAGES/DRESSINGS) ×1 IMPLANT
DRAPE C-ARM FOLDED MOBILE STRL (DRAPES) ×2 IMPLANT
ELECT REM PT RETURN 9FT ADLT (ELECTROSURGICAL) ×2
ELECTRODE REM PT RTRN 9FT ADLT (ELECTROSURGICAL) ×1 IMPLANT
GLOVE BIOGEL PI IND STRL 7.0 (GLOVE) ×1 IMPLANT
GLOVE BIOGEL PI INDICATOR 7.0 (GLOVE) ×1
GLOVE SURG SS PI 7.5 STRL IVOR (GLOVE) ×2 IMPLANT
GOWN STRL REUS W/TWL LRG LVL3 (GOWN DISPOSABLE) ×4 IMPLANT
IV NS 500ML (IV SOLUTION) ×1
IV NS 500ML BAXH (IV SOLUTION) ×1 IMPLANT
KIT PORT POWER 8FR ISP MRI (Port) ×2 IMPLANT
KIT ROOM TURNOVER APOR (KITS) ×2 IMPLANT
MANIFOLD NEPTUNE II (INSTRUMENTS) ×2 IMPLANT
NEEDLE HYPO 25X1 1.5 SAFETY (NEEDLE) ×2 IMPLANT
PACK MINOR (CUSTOM PROCEDURE TRAY) ×2 IMPLANT
PAD ARMBOARD 7.5X6 YLW CONV (MISCELLANEOUS) ×2 IMPLANT
SET BASIN LINEN APH (SET/KITS/TRAYS/PACK) ×2 IMPLANT
SET INTRODUCER 12FR PACEMAKER (SHEATH) IMPLANT
SHEATH COOK PEEL AWAY SET 8F (SHEATH) IMPLANT
SUT PROLENE 3 0 PS 2 (SUTURE) IMPLANT
SUT VIC AB 3-0 SH 27 (SUTURE) ×1
SUT VIC AB 3-0 SH 27X BRD (SUTURE) ×1 IMPLANT
SUT VIC AB 4-0 PS2 27 (SUTURE) ×2 IMPLANT
SYR 20CC LL (SYRINGE) ×2 IMPLANT
SYR CONTROL 10ML LL (SYRINGE) ×2 IMPLANT

## 2016-06-13 NOTE — Interval H&P Note (Signed)
History and Physical Interval Note:  06/13/2016 8:56 AM  John [name]  has presented today for surgery, with the diagnosis of lung cancer  The various methods of treatment have been discussed with the patient and family. After consideration of risks, benefits and other options for treatment, the patient has consented to  Procedure(s): INSERTION PORT-A-CATH (N/A) as a surgical intervention .  The patient's history has been reviewed, patient examined, no change in status, stable for surgery.  I have reviewed the patient's chart and labs.  Questions were answered to the patient's satisfaction.     Aviva Signs

## 2016-06-13 NOTE — Discharge Instructions (Signed)
Implanted Port Insertion, Care After °This sheet gives you information about how to care for yourself after your procedure. Your health care provider may also give you more specific instructions. If you have problems or questions, contact your health care provider. °What can I expect after the procedure? °After your procedure, it is common to have: °· Discomfort at the port insertion site. °· Bruising on the skin over the port. This should improve over 3-4 days. ° °Follow these instructions at home: °Port care °· After your port is placed, you will get a manufacturer's information card. The card has information about your port. Keep this card with you at all times. °· Take care of the port as told by your health care provider. Ask your health care provider if you or a family member can get training for taking care of the port at home. A home health care nurse may also take care of the port. °· Make sure to remember what type of port you have. °Incision care °· Follow instructions from your health care provider about how to take care of your port insertion site. Make sure you: °? Wash your hands with soap and water before you change your bandage (dressing). If soap and water are not available, use hand sanitizer. °? Change your dressing as told by your health care provider. °? Leave stitches (sutures), skin glue, or adhesive strips in place. These skin closures may need to stay in place for 2 weeks or longer. If adhesive strip edges start to loosen and curl up, you may trim the loose edges. Do not remove adhesive strips completely unless your health care provider tells you to do that. °· Check your port insertion site every day for signs of infection. Check for: °? More redness, swelling, or pain. °? More fluid or blood. °? Warmth. °? Pus or a bad smell. °General instructions °· Do not take baths, swim, or use a hot tub until your health care provider approves. °· Do not lift anything that is heavier than 10 lb (4.5  kg) for a week, or as told by your health care provider. °· Ask your health care provider when it is okay to: °? Return to work or school. °? Resume usual physical activities or sports. °· Do not drive for 24 hours if you were given a medicine to help you relax (sedative). °· Take over-the-counter and prescription medicines only as told by your health care provider. °· Wear a medical alert bracelet in case of an emergency. This will tell any health care providers that you have a port. °· Keep all follow-up visits as told by your health care provider. This is important. °Contact a health care provider if: °· You cannot flush your port with saline as directed, or you cannot draw blood from the port. °· You have a fever or chills. °· You have more redness, swelling, or pain around your port insertion site. °· You have more fluid or blood coming from your port insertion site. °· Your port insertion site feels warm to the touch. °· You have pus or a bad smell coming from the port insertion site. °Get help right away if: °· You have chest pain or shortness of breath. °· You have bleeding from your port that you cannot control. °Summary °· Take care of the port as told by your health care provider. °· Change your dressing as told by your health care provider. °· Keep all follow-up visits as told by your health care provider. °  This information is not intended to replace advice given to you by your health care provider. Make sure you discuss any questions you have with your health care provider. °Document Released: 01/12/2013 Document Revised: 02/13/2016 Document Reviewed: 02/13/2016 °Elsevier Interactive Patient Education © 2017 [employer] °Implanted Port Home Guide °An implanted port is a type of central line that is placed under the skin. Central lines are used to provide IV access when treatment or nutrition needs to be given through a person’s veins. Implanted ports are used for long-term IV access. An implanted port  may be placed because: °· You need IV medicine that would be irritating to the small veins in your hands or arms. °· You need long-term IV medicines, such as antibiotics. °· You need IV nutrition for a long period. °· You need frequent blood draws for lab tests. °· You need dialysis. ° °Implanted ports are usually placed in the chest area, but they can also be placed in the upper arm, the abdomen, or the leg. An implanted port has two main parts: °· Reservoir. The reservoir is round and will appear as a small, raised area under your skin. The reservoir is the part where a needle is inserted to give medicines or draw blood. °· Catheter. The catheter is a thin, flexible tube that extends from the reservoir. The catheter is placed into a large vein. Medicine that is inserted into the reservoir goes into the catheter and then into the vein. ° °How will I care for my incision site? °Do not get the incision site wet. Bathe or shower as directed by your health care provider. °How is my port accessed? °Special steps must be taken to access the port: °· Before the port is accessed, a numbing cream can be placed on the skin. This helps numb the skin over the port site. °· Your health care provider uses a sterile technique to access the port. °? Your health care provider must put on a mask and sterile gloves. °? The skin over your port is cleaned carefully with an antiseptic and allowed to dry. °? The port is gently pinched between sterile gloves, and a needle is inserted into the port. °· Only "non-coring" port needles should be used to access the port. Once the port is accessed, a blood return should be checked. This helps ensure that the port is in the vein and is not clogged. °· If your port needs to remain accessed for a constant infusion, a clear (transparent) bandage will be placed over the needle site. The bandage and needle will need to be changed every week, or as directed by your health care provider. °· Keep the  bandage covering the needle clean and dry. Do not get it wet. Follow your health care provider’s instructions on how to take a shower or bath while the port is accessed. °· If your port does not need to stay accessed, no bandage is needed over the port. ° °What is flushing? °Flushing helps keep the port from getting clogged. Follow your health care provider’s instructions on how and when to flush the port. Ports are usually flushed with saline solution or a medicine called heparin. The need for flushing will depend on how the port is used. °· If the port is used for intermittent medicines or blood draws, the port will need to be flushed: °? After medicines have been given. °? After blood has been drawn. °? As part of routine maintenance. °· If a constant infusion is   running, the port may not need to be flushed. ° °How long will my port stay implanted? °The port can stay in for as long as your health care provider thinks it is needed. When it is time for the port to come out, surgery will be done to remove it. The procedure is similar to the one performed when the port was put in. °When should I seek immediate medical care? °When you have an implanted port, you should seek immediate medical care if: °· You notice a bad smell coming from the incision site. °· You have swelling, redness, or drainage at the incision site. °· You have more swelling or pain at the port site or the surrounding area. °· You have a fever that is not controlled with medicine. ° °This information is not intended to replace advice given to you by your health care provider. Make sure you discuss any questions you have with your health care provider. °Document Released: 03/24/2005 Document Revised: 08/30/2015 Document Reviewed: 11/29/2012 °Elsevier Interactive Patient Education © 2017 Elsevier Inc. ° °

## 2016-06-13 NOTE — Op Note (Signed)
Patient:  John [name]  DOB:  30-Dec-1945  MRN:  [id number]   Preop Diagnosis:  Right lung carcinoma, need for central venous access  Postop Diagnosis:  Same  Procedure:  Port-A-Cath insertion  Surgeon:  Aviva Signs, M.D.  Anes:  Mac  Indications:  Patient is a 71 year old white male who is about to undergo chemotherapy for right lung carcinoma. The risks and benefits of the procedure including bleeding, infection, and pneumothorax were fully explained to the patient, who gave informed consent.  Procedure note:  The patient was placed in the Trendelenburg position after the left upper chest was prepped and draped using usual sterile technique with DuraPrep. Surgical site confirmation was performed. One percent Xylocaine was used for local anesthesia.  Incision was made below left clavicle. A subcutaneous pocket was formed. A needle was advanced into the left subclavian vein using the Seldinger technique without difficulty. A guidewire was then advanced into the right atrium under fluoroscopic guidance. An introducer and peel-away sheath were placed over the guidewire. The catheter was then inserted through the peel-away sheath and the peel-away sheath was removed. The catheter was then attached to the port and the port placed in subcutaneous pocket. Adequate positioning was confirmed by fluoroscopy. Good backflow blood was noted on aspiration of the port. The PowerPort was inserted. The port was flushed with heparin flush. Subcutaneous layer was reapproximated using a 4-0 Vicryl subcutaneous suture. The skin was closed using a 4-0 Vicryl subcuticular suture. Dermabond was then applied.  All tape and needle counts were correct at the end of the procedure. Patient was transferred to PACU in stable condition. A chest x-ray will be performed at that time.  Complications:  None  EBL:  Minimal  Specimen:  None

## 2016-06-13 NOTE — Addendum Note (Signed)
Addendum  created 06/13/16 1043 by Mickel Baas, CRNA   Anesthesia Intra Meds edited

## 2016-06-13 NOTE — Anesthesia Postprocedure Evaluation (Signed)
Anesthesia Post Note  Patient: John [name]  Procedure(s) Performed: Procedure(s) (LRB): INSERTION PORT-A-CATH (Left)  Anesthesia Type: MAC Level of consciousness: awake and alert Pain management: pain level controlled Vital Signs Assessment: post-procedure vital signs reviewed and stable Respiratory status: spontaneous breathing Cardiovascular status: stable Postop Assessment: no signs of nausea or vomiting Anesthetic complications: no     Last Vitals:  Vitals:   06/13/16 0904 06/13/16 1000  BP: (!) 155/78 (P) 126/60  Pulse: 74 (P) 73  Resp: (!) 22 (P) 18  Temp: 36.3 C     Last Pain:  Vitals:   06/13/16 0904  TempSrc: Oral  PainSc: 0-No pain                 ADAMS, AMY A

## 2016-06-13 NOTE — Anesthesia Preprocedure Evaluation (Addendum)
Anesthesia Evaluation  Patient identified by MRN, date of birth, ID band Patient awake    Reviewed: Allergy & Precautions, NPO status , Patient's Chart, lab work & pertinent test results, reviewed documented beta blocker date and time   History of Anesthesia Complications Negative for: history of anesthetic complications  Airway Mallampati: II  TM Distance: >3 FB Neck ROM: Full    Dental no notable dental hx. (+) Dental Advisory Given, Edentulous Upper, Edentulous Lower   Pulmonary neg pulmonary ROS, shortness of breath and with exertion, sleep apnea and Continuous Positive Airway Pressure Ventilation , COPD, Current Smoker,    Pulmonary exam normal        Cardiovascular hypertension, Pt. on medications +CHF and + DOE  negative cardio ROS Normal cardiovascular exam     Neuro/Psych PSYCHIATRIC DISORDERS Anxiety Depression negative neurological ROS  negative psych ROS   GI/Hepatic negative GI ROS,   Endo/Other  diabetes, Type 2  Renal/GU   negative genitourinary   Musculoskeletal   Abdominal   Peds negative pediatric ROS (+)  Hematology   Anesthesia Other Findings   Reproductive/Obstetrics                             Anesthesia Physical Anesthesia Plan  ASA: IV  Anesthesia Plan: MAC   Post-op Pain Management:    Induction: Intravenous  Airway Management Planned: Simple Face Mask  Additional Equipment:   Intra-op Plan:   Post-operative Plan:   Informed Consent: I have reviewed the patients History and Physical, chart, labs and discussed the procedure including the risks, benefits and alternatives for the proposed anesthesia with the patient or authorized representative who has indicated his/her understanding and acceptance.     Plan Discussed with:   Anesthesia Plan Comments:         Anesthesia Quick Evaluation

## 2016-06-13 NOTE — Anesthesia Procedure Notes (Signed)
Procedure Name: MAC Date/Time: 06/13/2016 9:15 AM Performed by: Andree Elk, Menna Abeln A Pre-anesthesia Checklist: Patient identified, Timeout performed, Emergency Drugs available, Suction available and Patient being monitored Oxygen Delivery Method: Simple face mask

## 2016-06-13 NOTE — Transfer of Care (Signed)
Immediate Anesthesia Transfer of Care Note  Patient: John [name]  Procedure(s) Performed: Procedure(s): INSERTION PORT-A-CATH (Left)  Patient Location: PACU  Anesthesia Type:MAC  Level of Consciousness: awake, alert , oriented and patient cooperative  Airway & Oxygen Therapy: Patient Spontanous Breathing and Patient connected to nasal cannula oxygen  Post-op Assessment: Report given to RN and Post -op Vital signs reviewed and stable  Post vital signs: Reviewed and stable  Last Vitals:  Vitals:   06/13/16 0900 06/13/16 0904  BP: (!) 155/78 (!) 155/78  Pulse:  74  Resp: 20 (!) 22  Temp:  36.3 C    Last Pain:  Vitals:   06/13/16 0904  TempSrc: Oral  PainSc: 0-No pain      Patients Stated Pain Goal: 7 (03/30/81 5003)  Complications: No apparent anesthesia complications

## 2016-06-16 ENCOUNTER — Encounter (HOSPITAL_COMMUNITY): Payer: Self-pay | Admitting: General Surgery

## 2016-06-17 DIAGNOSIS — E785 Hyperlipidemia, unspecified: Secondary | ICD-10-CM | POA: Diagnosis not present

## 2016-06-17 DIAGNOSIS — I10 Essential (primary) hypertension: Secondary | ICD-10-CM | POA: Diagnosis not present

## 2016-06-17 DIAGNOSIS — E1140 Type 2 diabetes mellitus with diabetic neuropathy, unspecified: Secondary | ICD-10-CM | POA: Diagnosis not present

## 2016-06-17 DIAGNOSIS — C3411 Malignant neoplasm of upper lobe, right bronchus or lung: Secondary | ICD-10-CM | POA: Diagnosis not present

## 2016-06-17 DIAGNOSIS — J449 Chronic obstructive pulmonary disease, unspecified: Secondary | ICD-10-CM | POA: Diagnosis not present

## 2016-06-17 DIAGNOSIS — Z510 Encounter for antineoplastic radiation therapy: Secondary | ICD-10-CM | POA: Diagnosis not present

## 2016-06-18 ENCOUNTER — Other Ambulatory Visit (HOSPITAL_COMMUNITY): Payer: Self-pay | Admitting: Oncology

## 2016-06-20 DIAGNOSIS — E1140 Type 2 diabetes mellitus with diabetic neuropathy, unspecified: Secondary | ICD-10-CM | POA: Diagnosis not present

## 2016-06-20 DIAGNOSIS — Z510 Encounter for antineoplastic radiation therapy: Secondary | ICD-10-CM | POA: Diagnosis not present

## 2016-06-20 DIAGNOSIS — E785 Hyperlipidemia, unspecified: Secondary | ICD-10-CM | POA: Diagnosis not present

## 2016-06-20 DIAGNOSIS — C3411 Malignant neoplasm of upper lobe, right bronchus or lung: Secondary | ICD-10-CM | POA: Diagnosis not present

## 2016-06-20 DIAGNOSIS — J449 Chronic obstructive pulmonary disease, unspecified: Secondary | ICD-10-CM | POA: Diagnosis not present

## 2016-06-20 DIAGNOSIS — I10 Essential (primary) hypertension: Secondary | ICD-10-CM | POA: Diagnosis not present

## 2016-06-24 ENCOUNTER — Encounter: Payer: Self-pay | Admitting: *Deleted

## 2016-06-24 DIAGNOSIS — E1140 Type 2 diabetes mellitus with diabetic neuropathy, unspecified: Secondary | ICD-10-CM | POA: Diagnosis not present

## 2016-06-24 DIAGNOSIS — I10 Essential (primary) hypertension: Secondary | ICD-10-CM | POA: Diagnosis not present

## 2016-06-24 DIAGNOSIS — J449 Chronic obstructive pulmonary disease, unspecified: Secondary | ICD-10-CM | POA: Diagnosis not present

## 2016-06-24 DIAGNOSIS — E785 Hyperlipidemia, unspecified: Secondary | ICD-10-CM | POA: Diagnosis not present

## 2016-06-24 DIAGNOSIS — Z510 Encounter for antineoplastic radiation therapy: Secondary | ICD-10-CM | POA: Diagnosis not present

## 2016-06-24 DIAGNOSIS — C3411 Malignant neoplasm of upper lobe, right bronchus or lung: Secondary | ICD-10-CM | POA: Diagnosis not present

## 2016-06-24 NOTE — Progress Notes (Signed)
[facility] Clinical Social Work  Clinical Social Work was referred by patient navigator for assessment of psychosocial needs due to new cancer diagnosis, starting treatment soon. Clinical Social Worker contacted patient at home to offer support and assess for needs.  CSW introduced self and explained role of CSW/resources to assist. Pt reports good support and denied current needs. CSW explained common emotions due to cancer diagnosis and resources to assist. CSW encouraged pt to reach out as needed.     Clinical Social Work interventions: Check in  [place], [place], OSW-C [place] Tuesdays   [phone]

## 2016-06-25 ENCOUNTER — Encounter (HOSPITAL_BASED_OUTPATIENT_CLINIC_OR_DEPARTMENT_OTHER): Payer: Medicare Other

## 2016-06-25 ENCOUNTER — Encounter (HOSPITAL_COMMUNITY): Payer: Self-pay

## 2016-06-25 ENCOUNTER — Telehealth (HOSPITAL_COMMUNITY): Payer: Self-pay | Admitting: Oncology

## 2016-06-25 ENCOUNTER — Encounter (HOSPITAL_BASED_OUTPATIENT_CLINIC_OR_DEPARTMENT_OTHER): Payer: Medicare Other | Admitting: Hematology

## 2016-06-25 VITALS — BP 141/65 | HR 87 | Temp 98.1°F | Resp 16

## 2016-06-25 VITALS — BP 192/78 | HR 102 | Temp 97.7°F | Resp 18 | Wt 189.8 lb

## 2016-06-25 DIAGNOSIS — J449 Chronic obstructive pulmonary disease, unspecified: Secondary | ICD-10-CM | POA: Diagnosis not present

## 2016-06-25 DIAGNOSIS — I2510 Atherosclerotic heart disease of native coronary artery without angina pectoris: Secondary | ICD-10-CM

## 2016-06-25 DIAGNOSIS — C3491 Malignant neoplasm of unspecified part of right bronchus or lung: Secondary | ICD-10-CM

## 2016-06-25 DIAGNOSIS — I10 Essential (primary) hypertension: Secondary | ICD-10-CM | POA: Diagnosis not present

## 2016-06-25 DIAGNOSIS — C3411 Malignant neoplasm of upper lobe, right bronchus or lung: Secondary | ICD-10-CM

## 2016-06-25 DIAGNOSIS — Z5111 Encounter for antineoplastic chemotherapy: Secondary | ICD-10-CM

## 2016-06-25 DIAGNOSIS — Z510 Encounter for antineoplastic radiation therapy: Secondary | ICD-10-CM | POA: Diagnosis not present

## 2016-06-25 DIAGNOSIS — E785 Hyperlipidemia, unspecified: Secondary | ICD-10-CM | POA: Diagnosis not present

## 2016-06-25 DIAGNOSIS — R0602 Shortness of breath: Secondary | ICD-10-CM | POA: Diagnosis not present

## 2016-06-25 DIAGNOSIS — E1140 Type 2 diabetes mellitus with diabetic neuropathy, unspecified: Secondary | ICD-10-CM | POA: Diagnosis not present

## 2016-06-25 DIAGNOSIS — E1149 Type 2 diabetes mellitus with other diabetic neurological complication: Secondary | ICD-10-CM | POA: Diagnosis not present

## 2016-06-25 DIAGNOSIS — R739 Hyperglycemia, unspecified: Secondary | ICD-10-CM | POA: Diagnosis not present

## 2016-06-25 DIAGNOSIS — R300 Dysuria: Secondary | ICD-10-CM | POA: Diagnosis not present

## 2016-06-25 LAB — CBC WITH DIFFERENTIAL/PLATELET
BASOS ABS: 0 10*3/uL (ref 0.0–0.1)
Basophils Relative: 0 %
EOS ABS: 0.2 10*3/uL (ref 0.0–0.7)
EOS PCT: 3 %
HCT: 39.8 % (ref 39.0–52.0)
Hemoglobin: 13.7 g/dL (ref 13.0–17.0)
LYMPHS PCT: 16 %
Lymphs Abs: 1.1 10*3/uL (ref 0.7–4.0)
MCH: 32.2 pg (ref 26.0–34.0)
MCHC: 34.4 g/dL (ref 30.0–36.0)
MCV: 93.6 fL (ref 78.0–100.0)
Monocytes Absolute: 0.8 10*3/uL (ref 0.1–1.0)
Monocytes Relative: 11 %
NEUTROS PCT: 70 %
Neutro Abs: 4.7 10*3/uL (ref 1.7–7.7)
PLATELETS: 283 10*3/uL (ref 150–400)
RBC: 4.25 MIL/uL (ref 4.22–5.81)
RDW: 14.4 % (ref 11.5–15.5)
WBC: 6.8 10*3/uL (ref 4.0–10.5)

## 2016-06-25 LAB — COMPREHENSIVE METABOLIC PANEL
ALBUMIN: 3.5 g/dL (ref 3.5–5.0)
ALT: 16 U/L — AB (ref 17–63)
AST: 19 U/L (ref 15–41)
Alkaline Phosphatase: 91 U/L (ref 38–126)
Anion gap: 6 (ref 5–15)
BUN: 17 mg/dL (ref 6–20)
CHLORIDE: 99 mmol/L — AB (ref 101–111)
CO2: 29 mmol/L (ref 22–32)
Calcium: 9 mg/dL (ref 8.9–10.3)
Creatinine, Ser: 0.86 mg/dL (ref 0.61–1.24)
GFR calc Af Amer: >60 mL/min (ref >60)
GFR calc non Af Amer: >60 mL/min (ref >60)
GLUCOSE: 274 mg/dL — AB (ref 65–99)
Potassium: 3.7 mmol/L (ref 3.5–5.1)
SODIUM: 134 mmol/L — AB (ref 135–145)
TOTAL PROTEIN: 7.1 g/dL (ref 6.5–8.1)
Total Bilirubin: 0.3 mg/dL (ref 0.3–1.2)

## 2016-06-25 LAB — MAGNESIUM: MAGNESIUM: 2 mg/dL (ref 1.7–2.4)

## 2016-06-25 MED ORDER — SODIUM CHLORIDE 0.9 % IV SOLN
215.8000 mg | Freq: Once | INTRAVENOUS | Status: AC
  Administered 2016-06-25: 220 mg via INTRAVENOUS
  Filled 2016-06-25: qty 22

## 2016-06-25 MED ORDER — FAMOTIDINE IN NACL 20-0.9 MG/50ML-% IV SOLN
INTRAVENOUS | Status: AC
  Filled 2016-06-25: qty 50

## 2016-06-25 MED ORDER — PALONOSETRON HCL INJECTION 0.25 MG/5ML
INTRAVENOUS | Status: AC
  Filled 2016-06-25: qty 5

## 2016-06-25 MED ORDER — PACLITAXEL CHEMO INJECTION 300 MG/50ML
45.0000 mg/m2 | Freq: Once | INTRAVENOUS | Status: AC
  Administered 2016-06-25: 90 mg via INTRAVENOUS
  Filled 2016-06-25: qty 15

## 2016-06-25 MED ORDER — SODIUM CHLORIDE 0.9 % IV SOLN
20.0000 mg | Freq: Once | INTRAVENOUS | Status: AC
  Administered 2016-06-25: 20 mg via INTRAVENOUS
  Filled 2016-06-25: qty 2

## 2016-06-25 MED ORDER — PALONOSETRON HCL INJECTION 0.25 MG/5ML
0.2500 mg | Freq: Once | INTRAVENOUS | Status: AC
  Administered 2016-06-25: 0.25 mg via INTRAVENOUS

## 2016-06-25 MED ORDER — FAMOTIDINE IN NACL 20-0.9 MG/50ML-% IV SOLN
20.0000 mg | Freq: Once | INTRAVENOUS | Status: AC
  Administered 2016-06-25: 20 mg via INTRAVENOUS

## 2016-06-25 MED ORDER — DIPHENHYDRAMINE HCL 50 MG/ML IJ SOLN
INTRAMUSCULAR | Status: AC
  Filled 2016-06-25: qty 1

## 2016-06-25 MED ORDER — SODIUM CHLORIDE 0.9 % IV SOLN
Freq: Once | INTRAVENOUS | Status: AC
  Administered 2016-06-25: 10:00:00 via INTRAVENOUS

## 2016-06-25 MED ORDER — SODIUM CHLORIDE 0.9% FLUSH: 10.0000 mL | INTRAVENOUS | Status: DC | PRN

## 2016-06-25 MED ORDER — HEPARIN SOD (PORK) LOCK FLUSH 100 UNIT/ML IV SOLN
500.0000 [IU] | Freq: Once | INTRAVENOUS | Status: AC | PRN
  Administered 2016-06-25: 500 [IU]

## 2016-06-25 MED ORDER — DIPHENHYDRAMINE HCL 50 MG/ML IJ SOLN
50.0000 mg | Freq: Once | INTRAMUSCULAR | Status: AC
  Administered 2016-06-25: 50 mg via INTRAVENOUS

## 2016-06-25 NOTE — Progress Notes (Signed)
Went to check on pt during first treatment.  Let pt know that John [name] got his EMLA cream and zofran approved and that he can pick it up at his pharmacy today.

## 2016-06-25 NOTE — Patient Instructions (Addendum)
[place] at [facility] Discharge Instructions  RECOMMENDATIONS MADE BY THE CONSULTANT AND ANY TEST RESULTS WILL BE SENT TO YOUR REFERRING PHYSICIAN.  You were seen today by Dr. Irene Limbo Follow up next week with lab work PLEASE make an appointment with your primary care physician to discuss high blood pressure, diabetes and your neuropathy.   See Amy up front for appointments    Thank you for choosing [place] at [facility] to provide your oncology and hematology care.  To afford each patient quality time with our provider, please arrive at least 15 minutes before your scheduled appointment time.    If you have a lab appointment with the [place] please come in thru the  Main Entrance and check in at the main information desk  You need to re-schedule your appointment should you arrive 10 or more minutes late.  We strive to give you quality time with our providers, and arriving late affects you and other patients whose appointments are after yours.  Also, if you no show three or more times for appointments you may be dismissed from the clinic at the providers discretion.     Again, thank you for choosing [facility].  Our hope is that these requests will decrease the amount of time that you wait before being seen by our physicians.       _____________________________________________________________  Should you have questions after your visit to [facility], please contact our office at [phone] between the hours of 8:30 a.m. and 4:30 p.m.  Voicemails left after 4:30 p.m. will not be returned until the following business day.  For prescription refill requests, have your pharmacy contact our office.       Resources For Cancer Patients and their Caregivers ? American Cancer Society: Can assist with transportation, wigs, general needs, runs Look Good Feel Better.        7095803616 ? Cancer  Care: Provides financial assistance, online support groups, medication/co-pay assistance.  1-800-813-HOPE 769 852 4409) ? [place] Assists [place] cancer patients and their families through emotional , educational and financial support.  564-360-7243 ? [place] DSS Where to apply for food stamps, Medicaid and utility assistance. 807-621-5779 ? RCATS: Transportation to medical appointments. 863-621-6782 ? Social Security Administration: May apply for disability if have a Stage IV cancer. (985) [phone] ? [employer], Disability and Transit Services: Assists with nutrition, care and transit needs. [place] Support Programs: '@10RELATIVEDAYS'$ @ > Cancer Support Group  2nd Tuesday of the month 1pm-2pm, Journey Room  > Creative Journey  3rd Tuesday of the month 1130am-1pm, Journey Room  > Look Good Feel Better  1st Wednesday of the month 10am-12 noon, Journey Room (Call [place] to register [phone])

## 2016-06-25 NOTE — Progress Notes (Signed)
Marland Kitchen  HEMATOLOGY ONCOLOGY PROGRESS NOTE  Date of service: .06/25/2016  Patient Care Team: [name], MD as PCP - General (Family Medicine)  CC: f/u for lung cancer  Diagnosis: Stage IIB/IIIA Non small cell lung cancer.  Current Treatment: Starting Carboplatin/Taxol + XRT  SUMMARY OF ONCOLOGIC HISTORY:   Non-small cell lung cancer, right ([place])   04/17/2016 Imaging    CT chest- 1. Examination is positive for bilateral upper lobe pulmonary lesions suspicious for malignancy. Further investigation with PET-CT and tissue sampling is advised. 2. Borderline enlarged paratracheal lymph nodes and right hilar nodes. Cannot rule out metastatic adenopathy. Attention to these areas on PET-CT is advised. 3. Emphysema 4. Aortic atherosclerosis and multi vessel coronary artery calcification.      04/18/2016 Initial Diagnosis    Adenocarcinoma of lung, right ([place])     04/24/2016 PET scan    1. Adjacent hypermetabolic nodules in the RIGHT upper lobe consistent bronchogenic carcinoma. 2. Suspicion of ipsilateral hilar and paratracheal nodal metastasis. 3. Nodule in the LEFT upper lobe with suspicious morphology has a relatively low metabolic activity for size. Favor synchronous primary bronchogenic carcinoma. 4. No metabolic activity associated with a small LEFT lower lobe pulmonary nodule. Recommend attention on follow-up.      05/06/2016 Imaging    MRI brain- Negative for metastatic disease.      05/28/2016 Procedure    1. Electromagnetic navigational bronchoscopy with brushings, needle     aspirations and biopsies of bilateral upper lobe nodules. 2. Endobronchial ultrasound with mediastinal and hilar lymph node     aspirations. By Dr. [name]      06/03/2016 Pathology Results    Diagnosis 1. Lung, biopsy, Right upper lobe - BLOOD AND BENIGN BRONCHIAL AND LUNG TISSUE. 2. Lung, biopsy, Left upper lobe - BLOOD AND BENIGN BRONCHIAL AND LUNG TISSUE.      06/03/2016  Pathology Results    Diagnosis TRANSBRONCHIAL NEEDLE ASPIRATION, NAVIGATION, RUL (SPECIMEN 5 OF 7, COLLECTED 05/28/16): MALIGNANT CELLS CONSISTENT WITH NON-SMALL CELL CARCINOMA Comment There are limited tumor cells in the cell block (insufficient for molecular testing). TTF-1 is positive in a few of the atypical cells. They appear negative for synaptophysin and cytokeratin 5/6. Overall there is limited tumor, but a lung adenocarcinoma is slightly favored. Dr. [name] has reviewed the case. The case was called to Dr. [name] on 06/03/2016.      06/13/2016 Procedure    Port placed by Dr. Arnoldo Morale       INTERVAL HISTORY:  Patient is here for follow-up prior to his first dose of weekly carbotaxol concurrent with radiation for stage IIB/IIIA non-small cell lung cancer. Treatment plans were decided by Darcella Gasman PA-C with Dr Talbert Cage.  Patient notes no acute new symptoms. He has history of diabetes and notes that he has been taken off his metformin by his primary care physician but got started anything different. His blood sugars are in the 200s today. He was recommended to follow-up with his primary care physician ASAP to develop a plan for treatment of his diabetes in the setting of chemotherapy likely with steroids.  He notes some restless legs in the setting of baseline diabetic neuropathy and has been on gabapentin.    REVIEW OF SYSTEMS:    10 Point review of systems of done and is negative except as noted above.  . Past Medical History:  Diagnosis Date  . Adenocarcinoma of lung, right ([place]) 04/18/2016  . Anxiety   . Arthritis   . CHF (congestive heart  failure) ([place])   . COPD (chronic obstructive pulmonary disease) ([place])   . Depression   . Diabetes mellitus without complication (HCC)    no meds  . Dyspnea   . History of kidney stones   . Hyperlipidemia   . Hypertension   . Macular degeneration   . Neuropathy ([place])   . Non-small cell lung cancer, right ([place]) 04/18/2016  .  Prostatitis   . Sleep apnea    cpap    . Past Surgical History:  Procedure Laterality Date  . CATARACT EXTRACTION, BILATERAL Bilateral   . NO PAST SURGERIES    . PORTACATH PLACEMENT Left 06/13/2016   Procedure: INSERTION PORT-A-CATH;  Surgeon: Aviva Signs, MD;  Location: [facility];  Service: General;  Laterality: Left;  Marland Kitchen VIDEO BRONCHOSCOPY WITH ENDOBRONCHIAL NAVIGATION N/A 05/28/2016   Procedure: VIDEO BRONCHOSCOPY WITH ENDOBRONCHIAL NAVIGATION;  Surgeon: [name], MD;  Location: [place];  Service: Thoracic;  Laterality: N/A;  . VIDEO BRONCHOSCOPY WITH ENDOBRONCHIAL ULTRASOUND N/A 05/28/2016   Procedure: VIDEO BRONCHOSCOPY WITH ENDOBRONCHIAL ULTRASOUND;  Surgeon: [name], MD;  Location: [place];  Service: Thoracic;  Laterality: N/A;    Social History  Substance Use Topics  . Smoking status: Current Every Day Smoker    Packs/day: 0.50    Years: 55.00    Types: Cigarettes    Start date: 03/11/1961  . Smokeless tobacco: Never Used  . Alcohol use No    ALLERGIES:  has No Known Allergies.  MEDICATIONS:  Current Outpatient Prescriptions  Medication Sig Dispense Refill  . albuterol (PROVENTIL HFA;VENTOLIN HFA) 108 (90 Base) MCG/ACT inhaler Inhale 2 puffs into the lungs every 6 (six) hours as needed for wheezing or shortness of breath. 1 Inhaler 2  . aspirin EC 81 MG tablet Take 81 mg by mouth daily.    . cloNIDine (CATAPRES) 0.1 MG tablet Take 1 tablet (0.1 mg total) by mouth 3 (three) times daily. 90 tablet 3  . dexamethasone (DECADRON) 4 MG tablet Take 2 tablets (8 mg total) by mouth daily. Start the day after chemotherapy for 2 days. 30 tablet 1  . finasteride (PROSCAR) 5 MG tablet TAKE 1 TABLET EVERY DAY 30 tablet 11  . fluticasone (FLONASE) 50 MCG/ACT nasal spray Place 2 sprays into both nostrils daily. (Patient taking differently: Place 2 sprays into both nostrils at bedtime as needed for allergies. ) 16 g 6  . gabapentin (NEURONTIN) 300 MG capsule Take 300-600  mg by mouth 2 (two) times daily. Pt takes one capsule in the morning and two at bedtime.    Marland Kitchen HYDROcodone-acetaminophen (NORCO) 5-325 MG tablet Take 1 tablet by mouth every 6 (six) hours as needed for moderate pain. 30 tablet 0  . INCRUSE ELLIPTA 62.5 MCG/INH AEPB INHALE 1 PUFF INTO THE LUNGS DAILY. 30 each 2  . irbesartan-hydrochlorothiazide (AVALIDE) 300-12.5 MG tablet Take 1 tablet by mouth daily. 90 tablet 1  . lidocaine-prilocaine (EMLA) cream Apply to affected area once 30 g 3  . LORazepam (ATIVAN) 0.5 MG tablet Take 1 tablet (0.5 mg total) by mouth 2 (two) times daily as needed for anxiety. 30 tablet 1  . LORazepam (ATIVAN) 0.5 MG tablet Take 1 tablet (0.5 mg total) by mouth every 6 (six) hours as needed (Nausea or vomiting). 30 tablet 0  . meloxicam (MOBIC) 7.5 MG tablet TAKE 1 TABLET EVERY DAY FOR BACK PAIN (Patient taking differently: TAKE 1 TABLET DAILY AS NEEDED FOR BACK PAIN) 90 tablet 0  . metoprolol succinate (TOPROL-XL) 50 MG  24 hr tablet Take 1 tablet (50 mg total) by mouth daily. Take with or immediately following a meal. (Patient taking differently: Take 50 mg by mouth at bedtime. Take with or immediately following a meal.) 90 tablet 3  . ondansetron (ZOFRAN) 8 MG tablet Take 1 tablet (8 mg total) by mouth 2 (two) times daily as needed for refractory nausea / vomiting. Start on day 3 after chemo. 30 tablet 1  . pravastatin (PRAVACHOL) 40 MG tablet Take 40 mg by mouth every 3 (three) days.     . prochlorperazine (COMPAZINE) 10 MG tablet Take 1 tablet (10 mg total) by mouth every 6 (six) hours as needed (Nausea or vomiting). 30 tablet 1  . tamsulosin (FLOMAX) 0.4 MG CAPS capsule Take 0.4 mg by mouth daily after breakfast.    . amLODipine (NORVASC) 5 MG tablet      No current facility-administered medications for this visit.    Facility-Administered Medications Ordered in Other Visits  Medication Dose Route Frequency Provider Last Rate Last Dose  . CARBOplatin (PARAPLATIN) 220 mg  in sodium chloride 0.9 % 250 mL chemo infusion  220 mg Intravenous Once Brunetta Genera, MD      . dexamethasone (DECADRON) 20 mg in sodium chloride 0.9 % 50 mL IVPB  20 mg Intravenous Once Brunetta Genera, MD   20 mg at 06/25/16 1012  . heparin lock flush 100 unit/mL  500 Units Intracatheter Once PRN Brunetta Genera, MD      . PACLitaxel (TAXOL) 90 mg in dextrose 5 % 250 mL chemo infusion (</= '80mg'$ /m2)  45 mg/m2 (Treatment Plan Recorded) Intravenous Once Brunetta Genera, MD      . sodium chloride flush (NS) 0.9 % injection 10 mL  10 mL Intracatheter PRN [place], MD        PHYSICAL EXAMINATION: ECOG PERFORMANCE STATUS: 1 - Symptomatic but completely ambulatory  . Vitals:   06/25/16 0848  BP: (!) 192/78  Pulse: (!) 102  Resp: 18  Temp: 97.7 F (36.5 C)    Filed Weights   06/25/16 0848  Weight: 189 lb 12.8 oz (86.1 kg)   .Body mass index is 30.63 kg/m.  GENERAL:alert, in no acute distress and comfortable SKIN: no acute rashes, no significant lesions EYES: conjunctiva are pink and non-injected, sclera anicteric OROPHARYNX: MMM, no exudates, no oropharyngeal erythema or ulceration NECK: supple, no JVD LYMPH:  no palpable lymphadenopathy in the cervical, axillary or inguinal regions LUNGS: clear to auscultation b/l with normal respiratory effort HEART: regular rate & rhythm ABDOMEN:  normoactive bowel sounds , non tender, not distended. Extremity: no pedal edema PSYCH: alert & oriented x 3 with fluent speech NEURO: no focal motor/sensory deficits  LABORATORY DATA:   I have reviewed the data as listed  . CBC Latest Ref Rng & Units 06/25/2016 05/26/2016 02/01/2016  WBC 4.0 - 10.5 K/uL 6.8 8.5 7.5  Hemoglobin 13.0 - 17.0 g/dL 13.7 14.0 -  Hematocrit 39.0 - 52.0 % 39.8 40.9 40.1  Platelets 150 - 400 K/uL 283 297 328    . CMP Latest Ref Rng & Units 06/25/2016 05/26/2016 05/19/2016  Glucose 65 - 99 mg/dL 274(H) 165(H) 179(H)  BUN 6 - 20 mg/dL '17 15  23  '$ Creatinine 0.61 - 1.24 mg/dL 0.86 0.91 0.90  Sodium 135 - 145 mmol/L 134(L) 138 139  Potassium 3.5 - 5.1 mmol/L 3.7 4.1 4.6  Chloride 101 - 111 mmol/L 99(L) 101 97  CO2 22 - 32 mmol/L 29 28  29  Calcium 8.9 - 10.3 mg/dL 9.0 9.2 9.4  Total Protein 6.5 - 8.1 g/dL 7.1 7.0 -  Total Bilirubin 0.3 - 1.2 mg/dL 0.3 0.4 -  Alkaline Phos 38 - 126 U/L 91 90 -  AST 15 - 41 U/L 19 17 -  ALT 17 - 63 U/L 16(L) 15(L) -    RADIOGRAPHIC STUDIES: I have personally reviewed the radiological images as listed and agreed with the findings in the report. Dg Chest 2 View  Result Date: 05/26/2016 CLINICAL DATA:  Preop for lung nodule biopsy. EXAM: CHEST  2 VIEW COMPARISON:  04/17/2016 FINDINGS: Biapical lung nodules which are subtle by radiography. No gross evidence of progression. There is no edema, consolidation, effusion, or pneumothorax. No acute osseous finding. IMPRESSION: 1. No acute finding. 2. Emphysema and bilateral lung nodules. Electronically Signed   By: Monte Fantasia M.D.   On: 05/26/2016 14:40   Dg Chest Port 1 View  Result Date: 06/13/2016 CLINICAL DATA:  Left-sided port placement. EXAM: PORTABLE CHEST 1 VIEW COMPARISON:  None. FINDINGS: A left-sided port terminates in the central SVC. No pneumothorax. No other interval changes. Low lung volumes mildly limit evaluation. IMPRESSION: The left-sided port is in good position.  No pneumothorax. Electronically Signed   By: Dorise Bullion III M.D   On: 06/13/2016 10:39   Dg C-arm 1-60 Min-no Report  Result Date: 06/13/2016 Fluoroscopy was utilized by the requesting physician.  No radiographic interpretation.   Dg C-arm Bronchoscopy  Result Date: 05/28/2016 C-ARM BRONCHOSCOPY: Fluoroscopy was utilized by the requesting physician.  No radiographic interpretation.    ASSESSMENT & PLAN:   71 yo with   1) Non-small cell lung cancer, right (HCC) (Stage IIB/IIIA with T1b,N1-2 disease) NSCLC of right lung with hilar and paratracheal lymphadenopathy  on PET imaging, favoring adenocarcinoma.    2) Concern for possible second primary in the LUL (Bx neg but still concerning) Plan -Dr Maylon Peppers has previously seen in the patient with Kirby Crigler and plan was decided for concurrent carboplatin and Taxol chemotherapy concurrent with radiation . -Patient notes presence of baseline diabetic neuropathy that is characterized by feet numbness but no significant pain. Is on chronic gabapentin therapy.  -Would have a very low threshold to switch him to carboplatin and etoposide with any worsening neuropathy. -Counseled patient on smoking cessation. He was smoking one half packs per day and has cut it down to about half a pack a day.  3) Uncontrolled DM2. -Patient reports he was previously distant metformin which was held due to liver issues. He reports that he was not started on an alternative medication by his primary care physician. -Noted to have hyperglycemia today with blood sugars about 270. Receiving sliding scale insulin. -Recommended he see his primary care doctor as soon as possible to develop a plan for treatment of his diabetes likely with basal insulin and sliding scale while on chemotherapy. -He will also need optimization of his blood pressure management. He is already on multiple blood pressure medications. -He notes restless leg symptoms and is on Neurontin for this. Might need additional optimization. We'll defer this to his primary care physician.  Return to care in one week with repeat labs for toxicity check  I spent 20 minutes counseling the patient face to face. The total time spent in the appointment was 25 minutes and more than 50% was on counseling and direct patient cares.    Sullivan Lone MD MS AAHIVMS [facility] Hematology/Oncology Physician [facility]  (  Office):       [phone] (Work cell):  [phone] (Fax):           [phone]

## 2016-06-25 NOTE — Patient Instructions (Signed)
[facility] Discharge Instructions for Patients Receiving Chemotherapy   Beginning January 23rd 2017 lab work for the [facility] will be done in the  Main lab at [facility] on 1st floor. If you have a lab appointment with the [place] please come in thru the  Main Entrance and check in at the main information desk   Today you received the following chemotherapy agents: Taxol and Carboplatin.     If you develop nausea and vomiting, or diarrhea that is not controlled by your medication, call the clinic.  The clinic phone number is [phone]. Office hours are Monday-Friday 8:30am-5:00pm.  BELOW ARE SYMPTOMS THAT SHOULD BE REPORTED IMMEDIATELY:  *FEVER GREATER THAN 101.0 F  *CHILLS WITH OR WITHOUT FEVER  NAUSEA AND VOMITING THAT IS NOT CONTROLLED WITH YOUR NAUSEA MEDICATION  *UNUSUAL SHORTNESS OF BREATH  *UNUSUAL BRUISING OR BLEEDING  TENDERNESS IN MOUTH AND THROAT WITH OR WITHOUT PRESENCE OF ULCERS  *URINARY PROBLEMS  *BOWEL PROBLEMS  UNUSUAL RASH Items with * indicate a potential emergency and should be followed up as soon as possible. If you have an emergency after office hours please contact your primary care physician or go to the nearest emergency department.  Please call the clinic during office hours if you have any questions or concerns.   You may also contact the Patient Navigator at [phone] should you have any questions or need assistance in obtaining follow up care.      Resources For Cancer Patients and their Caregivers ? American Cancer Society: Can assist with transportation, wigs, general needs, runs Look Good Feel Better.        207-197-3537 ? Cancer Care: Provides financial assistance, online support groups, medication/co-pay assistance.  1-800-813-HOPE 862 056 4679) ? [place] Assists [place] cancer patients and their families through emotional , educational and financial support.   914-738-9232 ? [place] DSS Where to apply for food stamps, Medicaid and utility assistance. ([phone] [phone] ? RCATS: Transportation to medical appointments. ([phone] 703-5606 ? Social Security Administration: May apply for disability if have a Stage IV cancer. [phone] ? [employer], Disability and Transit Services: Assists with nutrition, care and transit needs. [phone]

## 2016-06-25 NOTE — Progress Notes (Signed)
Patient tolerated infusion well.  No adverse reactions or side effects.  VSS throughout.  Patient ambulatory and stable at discharge, but wheeled out via wheelchair because he was drowsy from Benadryl and a fall risk.  Patient understands schedule moving forward as well as his wife understands that he may need a driver with treatments moving forward r/t his drowsiness from the Benadryl.

## 2016-06-25 NOTE — Telephone Encounter (Signed)
PC TO [employer] AND OBTAINED AUTHS FOR BOTH ZOFRAN AND EMLA CREAM.

## 2016-06-26 ENCOUNTER — Telehealth (HOSPITAL_COMMUNITY): Payer: Self-pay

## 2016-06-26 DIAGNOSIS — E1140 Type 2 diabetes mellitus with diabetic neuropathy, unspecified: Secondary | ICD-10-CM | POA: Diagnosis not present

## 2016-06-26 DIAGNOSIS — J449 Chronic obstructive pulmonary disease, unspecified: Secondary | ICD-10-CM | POA: Diagnosis not present

## 2016-06-26 DIAGNOSIS — L84 Corns and callosities: Secondary | ICD-10-CM | POA: Diagnosis not present

## 2016-06-26 DIAGNOSIS — E1142 Type 2 diabetes mellitus with diabetic polyneuropathy: Secondary | ICD-10-CM | POA: Diagnosis not present

## 2016-06-26 DIAGNOSIS — Z510 Encounter for antineoplastic radiation therapy: Secondary | ICD-10-CM | POA: Diagnosis not present

## 2016-06-26 DIAGNOSIS — M79676 Pain in unspecified toe(s): Secondary | ICD-10-CM | POA: Diagnosis not present

## 2016-06-26 DIAGNOSIS — E785 Hyperlipidemia, unspecified: Secondary | ICD-10-CM | POA: Diagnosis not present

## 2016-06-26 DIAGNOSIS — C3411 Malignant neoplasm of upper lobe, right bronchus or lung: Secondary | ICD-10-CM | POA: Diagnosis not present

## 2016-06-26 DIAGNOSIS — B351 Tinea unguium: Secondary | ICD-10-CM | POA: Diagnosis not present

## 2016-06-26 DIAGNOSIS — I10 Essential (primary) hypertension: Secondary | ICD-10-CM | POA: Diagnosis not present

## 2016-06-26 NOTE — Telephone Encounter (Signed)
24 hour follow up call- left message for patient to call us if he had any concerns or questions.

## 2016-06-27 ENCOUNTER — Ambulatory Visit (INDEPENDENT_AMBULATORY_CARE_PROVIDER_SITE_OTHER): Payer: Medicare Other | Admitting: Family Medicine

## 2016-06-27 ENCOUNTER — Encounter: Payer: Self-pay | Admitting: Family Medicine

## 2016-06-27 VITALS — BP 128/64 | HR 96 | Temp 97.3°F | Ht 66.0 in | Wt 190.2 lb

## 2016-06-27 DIAGNOSIS — E119 Type 2 diabetes mellitus without complications: Secondary | ICD-10-CM

## 2016-06-27 DIAGNOSIS — F419 Anxiety disorder, unspecified: Secondary | ICD-10-CM | POA: Diagnosis not present

## 2016-06-27 DIAGNOSIS — I2510 Atherosclerotic heart disease of native coronary artery without angina pectoris: Secondary | ICD-10-CM | POA: Diagnosis not present

## 2016-06-27 DIAGNOSIS — J449 Chronic obstructive pulmonary disease, unspecified: Secondary | ICD-10-CM | POA: Diagnosis not present

## 2016-06-27 DIAGNOSIS — I10 Essential (primary) hypertension: Secondary | ICD-10-CM

## 2016-06-27 DIAGNOSIS — C3411 Malignant neoplasm of upper lobe, right bronchus or lung: Secondary | ICD-10-CM | POA: Diagnosis not present

## 2016-06-27 DIAGNOSIS — E1140 Type 2 diabetes mellitus with diabetic neuropathy, unspecified: Secondary | ICD-10-CM | POA: Diagnosis not present

## 2016-06-27 DIAGNOSIS — E785 Hyperlipidemia, unspecified: Secondary | ICD-10-CM | POA: Diagnosis not present

## 2016-06-27 DIAGNOSIS — Z510 Encounter for antineoplastic radiation therapy: Secondary | ICD-10-CM | POA: Diagnosis not present

## 2016-06-27 MED ORDER — GLIPIZIDE 5 MG PO TABS: 5.0000 mg | ORAL_TABLET | Freq: Every day | ORAL | 1 refills | Status: DC

## 2016-06-27 MED ORDER — BLOOD GLUCOSE MONITOR KIT: PACK | 0 refills | Status: DC

## 2016-06-27 NOTE — Patient Instructions (Signed)
Great to see you!  Check your blood sugar once daily before eating or drinking anything with sugar and write it down.   Come [place] to see our clinical pharmacist in 2 weeks.

## 2016-06-27 NOTE — Progress Notes (Signed)
   HPI  Patient presents today here to follow-up for diabetes, hypertension.  Diabetes Patient states that he is watching his diet somewhat He has not been checking his blood sugars Recent glucose reading with oncology his labs was 276.  Hypertension Good medication compliance Blood pressures have been 120s and 30s on most checks, he is initially very high whenever he is checked and most offices and then improves as he rests.  PMH: Smoking status noted ROS: Per HPI  Objective: BP 128/64   Pulse 96   Temp 97.3 F (36.3 C) (Oral)   Ht '5\' 6"'$  (1.676 m)   Wt 190 lb 3.2 oz (86.3 kg)   BMI 30.70 kg/m  Gen: NAD, alert, cooperative with exam HEENT: NCAT CV: RRR, good S1/S2, no murmur Resp: CTABL, no wheezes, non-labored Ext: No edema, warm Neuro: Alert and oriented, No gross deficits  Assessment and plan:  # Type 2 diabetes Starting glipizide today, his A1c one month ago was 7.5. This was diagnostic for type 2 diabetes, however her goal for most people over the age of 68 is 8.0 or less. He has had significantly elevated glucose reading at 276, so we will start medications today. Patient does not want to take insulin injections, at this point I do not believe that is necessary also. Starting glipizide, believe this will give him quick control. Patient was instructed how to check his blood sugars in clinic today, he will record fasting blood sugar once daily or 2 weeks and return to follow-up with our clinical pharmacist for medication adjustment and diabetic education  # Hypertension Well controlled Continue current medications  # Anxiety Helped by Ativan, continue   Meds ordered this encounter  Medications  . glipiZIDE (GLUCOTROL) 5 MG tablet    Sig: Take 1 tablet (5 mg total) by mouth daily before breakfast.    Dispense:  30 tablet    Refill:  [place], MD [facility] 06/27/2016, 11:39 AM

## 2016-06-30 ENCOUNTER — Telehealth: Payer: Self-pay | Admitting: Family Medicine

## 2016-06-30 DIAGNOSIS — E1140 Type 2 diabetes mellitus with diabetic neuropathy, unspecified: Secondary | ICD-10-CM | POA: Diagnosis not present

## 2016-06-30 DIAGNOSIS — Z510 Encounter for antineoplastic radiation therapy: Secondary | ICD-10-CM | POA: Diagnosis not present

## 2016-06-30 DIAGNOSIS — J449 Chronic obstructive pulmonary disease, unspecified: Secondary | ICD-10-CM | POA: Diagnosis not present

## 2016-06-30 DIAGNOSIS — I10 Essential (primary) hypertension: Secondary | ICD-10-CM | POA: Diagnosis not present

## 2016-06-30 DIAGNOSIS — C3411 Malignant neoplasm of upper lobe, right bronchus or lung: Secondary | ICD-10-CM | POA: Diagnosis not present

## 2016-06-30 DIAGNOSIS — E785 Hyperlipidemia, unspecified: Secondary | ICD-10-CM | POA: Diagnosis not present

## 2016-06-30 NOTE — Telephone Encounter (Signed)
Has he been going this morning? Stop the medication to see if improves over next 2 days. Call back with results.

## 2016-06-30 NOTE — Telephone Encounter (Signed)
Pt notified of recommendation Verbalizes understanding 

## 2016-07-01 DIAGNOSIS — Z510 Encounter for antineoplastic radiation therapy: Secondary | ICD-10-CM | POA: Diagnosis not present

## 2016-07-01 DIAGNOSIS — C3411 Malignant neoplasm of upper lobe, right bronchus or lung: Secondary | ICD-10-CM | POA: Diagnosis not present

## 2016-07-01 DIAGNOSIS — E1140 Type 2 diabetes mellitus with diabetic neuropathy, unspecified: Secondary | ICD-10-CM | POA: Diagnosis not present

## 2016-07-01 DIAGNOSIS — E785 Hyperlipidemia, unspecified: Secondary | ICD-10-CM | POA: Diagnosis not present

## 2016-07-01 DIAGNOSIS — I10 Essential (primary) hypertension: Secondary | ICD-10-CM | POA: Diagnosis not present

## 2016-07-01 DIAGNOSIS — J449 Chronic obstructive pulmonary disease, unspecified: Secondary | ICD-10-CM | POA: Diagnosis not present

## 2016-07-01 NOTE — Progress Notes (Addendum)
[place], [place] [address]   CLINIC:  Medical Oncology/Hematology  PCP:  [name], MD [place] [address] [phone]   REASON FOR VISIT:  Follow-up for Stage IIB/IIIA NSCLC (T1b, N1-2, M0) right lung   CURRENT THERAPY: Concurrent chemoradiation with Carbo/Taxol weekly    BRIEF ONCOLOGIC HISTORY:    Non-small cell lung cancer, right ([facility])   04/17/2016 Imaging    CT chest- 1. Examination is positive for bilateral upper lobe pulmonary lesions suspicious for malignancy. Further investigation with PET-CT and tissue sampling is advised. 2. Borderline enlarged paratracheal lymph nodes and right hilar nodes. Cannot rule out metastatic adenopathy. Attention to these areas on PET-CT is advised. 3. Emphysema 4. Aortic atherosclerosis and multi vessel coronary artery calcification.      04/18/2016 Initial Diagnosis    Adenocarcinoma of lung, right ([facility])     04/24/2016 PET scan    1. Adjacent hypermetabolic nodules in the RIGHT upper lobe consistent bronchogenic carcinoma. 2. Suspicion of ipsilateral hilar and paratracheal nodal metastasis. 3. Nodule in the LEFT upper lobe with suspicious morphology has a relatively low metabolic activity for size. Favor synchronous primary bronchogenic carcinoma. 4. No metabolic activity associated with a small LEFT lower lobe pulmonary nodule. Recommend attention on follow-up.      05/06/2016 Imaging    MRI brain- Negative for metastatic disease.      05/28/2016 Procedure    1. Electromagnetic navigational bronchoscopy with brushings, needle     aspirations and biopsies of bilateral upper lobe nodules. 2. Endobronchial ultrasound with mediastinal and hilar lymph node     aspirations. By Dr. [name]      06/03/2016 Pathology Results    Diagnosis 1. Lung, biopsy, Right upper lobe - BLOOD AND BENIGN BRONCHIAL AND LUNG TISSUE. 2. Lung, biopsy, Left upper lobe - BLOOD AND  BENIGN BRONCHIAL AND LUNG TISSUE.      06/03/2016 Pathology Results    Diagnosis TRANSBRONCHIAL NEEDLE ASPIRATION, NAVIGATION, RUL (SPECIMEN 5 OF 7, COLLECTED 05/28/16): MALIGNANT CELLS CONSISTENT WITH NON-SMALL CELL CARCINOMA Comment There are limited tumor cells in the cell block (insufficient for molecular testing). TTF-1 is positive in a few of the atypical cells. They appear negative for synaptophysin and cytokeratin 5/6. Overall there is limited tumor, but a lung adenocarcinoma is slightly favored. Dr. [name] has reviewed the case. The case was called to Dr. [name] on 06/03/2016.      06/13/2016 Procedure    Port placed by Dr. Arnoldo Morale      06/25/2016 -  Chemotherapy    The patient had palonosetron (ALOXI) injection 0.25 mg, 0.25 mg, Intravenous,  Once, 1 of 6 cycles  CARBOplatin (PARAPLATIN) 220 mg in sodium chloride 0.9 % 250 mL chemo infusion, 220 mg (100 % of original dose 215.8 mg), Intravenous,  Once, 1 of 6 cycles Dose modification:   (original dose 215.8 mg, Cycle 1)  PACLitaxel (TAXOL) 90 mg in dextrose 5 % 250 mL chemo infusion ( for chemotherapy treatment.          INTERVAL HISTORY:  John [name] 71 y.o. male returns for follow-up and for consideration of next cycle chemotherapy.   He received cycle #1 of Carbo/Taxol on 06/25/16; he has started his radiation which is going well.  Thus far, he is tolerating therapy relatively well. His biggest complaint today is urinary retention and significant; he noted having trouble voiding about 2 days after starting Glipizide, prescribed by his PCP for elevated blood sugar  since starting chemotherapy.  He reports having to void every hour one night over the weekend after starting the glipizide.  He notified his PCP to make him aware of his urinary symptoms and was instructed to stop the glipizide for now d/t concerns regarding his BPH. He remains on the Flomax and Proscar as prescribed. His urinary retention is improving and are a  little better today. Endorses occasional dysuria; he has difficulty with maintaining urinary stream/flow. He tries to drink plenty of fluid.   Also reports struggling with constipation in recent days. He took 6 stool softener pills yesterday and had small BM this morning. He would like recommendations for what he can take going forward.   Denies fevers; does report having episode of chills last night.  Endorses occasional "little headache, but it's not too bad."  He is wondering if he can have less Benadryl before his chemotherapy today "because that stuff made me feel so drunk that I wasn't back to normal until many hours later after chemo."  He has chronic peripheral neuropathy to bilateral feet and fingertips. Symptoms are no worse and are managed by gabapentin. The neuropathy does not affect his ADLs.    REVIEW OF SYSTEMS:  Review of Systems  Constitutional: Positive for chills (1 episode of chills last night). Negative for fatigue and fever.  HENT:  Negative.  Negative for mouth sores and sore throat.   Eyes: Negative.   Respiratory: Positive for shortness of breath (chronic DOE ). Negative for cough.        He feels like his breathing is improving   Cardiovascular: Negative.  Negative for chest pain and leg swelling.  Gastrointestinal: Positive for constipation. Negative for abdominal pain, blood in stool, diarrhea, nausea and vomiting.  Endocrine:       Hyperglycemia  Genitourinary: Positive for difficulty urinating (improving), dysuria, frequency and nocturia. Negative for hematuria.   Skin: Negative.  Negative for rash.  Neurological: Positive for headaches and numbness (chronic peripheral neuropathy). Negative for dizziness.  Hematological: Negative.   Psychiatric/Behavioral: Positive for sleep disturbance (r/t nocturia ).     PAST MEDICAL/SURGICAL HISTORY:  Past Medical History:  Diagnosis Date  . Adenocarcinoma of lung, right ([facility]) 04/18/2016  . Anxiety   . Arthritis   .  CHF (congestive heart failure) ([facility])   . COPD (chronic obstructive pulmonary disease) ([facility])   . Depression   . Diabetes mellitus without complication ([facility])    no meds  . Dyspnea   . History of kidney stones   . Hyperlipidemia   . Hypertension   . Macular degeneration   . Neuropathy ([facility])   . Non-small cell lung cancer, right ([facility]) 04/18/2016  . Prostatitis   . Sleep apnea    cpap   Past Surgical History:  Procedure Laterality Date  . CATARACT EXTRACTION, BILATERAL Bilateral   . NO PAST SURGERIES    . PORTACATH PLACEMENT Left 06/13/2016   Procedure: INSERTION PORT-A-CATH;  Surgeon: Aviva Signs, MD;  Location: [facility];  Service: General;  Laterality: Left;  Marland Kitchen VIDEO BRONCHOSCOPY WITH ENDOBRONCHIAL NAVIGATION N/A 05/28/2016   Procedure: VIDEO BRONCHOSCOPY WITH ENDOBRONCHIAL NAVIGATION;  Surgeon: [name], MD;  Location: [facility];  Service: Thoracic;  Laterality: N/A;  . VIDEO BRONCHOSCOPY WITH ENDOBRONCHIAL ULTRASOUND N/A 05/28/2016   Procedure: VIDEO BRONCHOSCOPY WITH ENDOBRONCHIAL ULTRASOUND;  Surgeon: [name], MD;  Location: [facility];  Service: Thoracic;  Laterality: N/A;     SOCIAL HISTORY:  Social History   Social History  .  Marital status: Single    Spouse name: N/A  . Number of children: N/A  . Years of education: N/A   Occupational History  . Not on file.   Social History Main Topics  . Smoking status: Current Every Day Smoker    Packs/day: 0.50    Years: 55.00    Types: Cigarettes    Start date: 03/11/1961  . Smokeless tobacco: Never Used  . Alcohol use No  . Drug use: No  . Sexual activity: Yes    Birth control/ protection: None   Other Topics Concern  . Not on file   Social History Narrative  . No narrative on file    FAMILY HISTORY:  Family History  Problem Relation Age of Onset  . Hypertension Mother   . Diabetes Father   . Heart disease Father   . Hypertension Sister     CURRENT MEDICATIONS:  Outpatient Encounter  Prescriptions as of 07/02/2016  Medication Sig Note  . albuterol (PROVENTIL HFA;VENTOLIN HFA) 108 (90 Base) MCG/ACT inhaler Inhale 2 puffs into the lungs every 6 (six) hours as needed for wheezing or shortness of breath.   Marland Kitchen amLODipine (NORVASC) 5 MG tablet    . aspirin EC 81 MG tablet Take 81 mg by mouth daily.   . blood glucose meter kit and supplies KIT Dispense based on patient and insurance preference. Use up to 2 times daily as directed. (FOR ICD-9 250.00, 250.01).   . cloNIDine (CATAPRES) 0.1 MG tablet Take 1 tablet (0.1 mg total) by mouth 3 (three) times daily.   Marland Kitchen dexamethasone (DECADRON) 4 MG tablet Take 2 tablets (8 mg total) by mouth daily. Start the day after chemotherapy for 2 days. 06/11/2016: Pt has not picked-up RX yet.    . finasteride (PROSCAR) 5 MG tablet TAKE 1 TABLET EVERY DAY   . fluticasone (FLONASE) 50 MCG/ACT nasal spray Place 2 sprays into both nostrils daily. (Patient taking differently: Place 2 sprays into both nostrils at bedtime as needed for allergies. )   . gabapentin (NEURONTIN) 300 MG capsule Take 300-600 mg by mouth 2 (two) times daily. Pt takes one capsule in the morning and two at bedtime.   Marland Kitchen glipiZIDE (GLUCOTROL) 5 MG tablet Take 1 tablet (5 mg total) by mouth daily before breakfast.   . HYDROcodone-acetaminophen (NORCO) 5-325 MG tablet Take 1 tablet by mouth every 6 (six) hours as needed for moderate pain.   . INCRUSE ELLIPTA 62.5 MCG/INH AEPB INHALE 1 PUFF INTO THE LUNGS DAILY.   Marland Kitchen irbesartan-hydrochlorothiazide (AVALIDE) 300-12.5 MG tablet Take 1 tablet by mouth daily.   Marland Kitchen lidocaine-prilocaine (EMLA) cream Apply to affected area once 06/11/2016: Pt has not picked-up RX yet.    Marland Kitchen LORazepam (ATIVAN) 0.5 MG tablet Take 1 tablet (0.5 mg total) by mouth 2 (two) times daily as needed for anxiety.   Marland Kitchen LORazepam (ATIVAN) 0.5 MG tablet Take 1 tablet (0.5 mg total) by mouth every 6 (six) hours as needed (Nausea or vomiting). 06/11/2016: Pt has not picked-up RX yet.    .  meloxicam (MOBIC) 7.5 MG tablet TAKE 1 TABLET EVERY DAY FOR BACK PAIN (Patient taking differently: TAKE 1 TABLET DAILY AS NEEDED FOR BACK PAIN)   . metoprolol succinate (TOPROL-XL) 50 MG 24 hr tablet Take 1 tablet (50 mg total) by mouth daily. Take with or immediately following a meal. (Patient taking differently: Take 50 mg by mouth at bedtime. Take with or immediately following a meal.)   . ondansetron (ZOFRAN) 8 MG tablet  Take 1 tablet (8 mg total) by mouth 2 (two) times daily as needed for refractory nausea / vomiting. Start on day 3 after chemo. 06/11/2016: Pt has not picked-up RX yet.    . pravastatin (PRAVACHOL) 40 MG tablet Take 40 mg by mouth every 3 (three) days.    . prochlorperazine (COMPAZINE) 10 MG tablet Take 1 tablet (10 mg total) by mouth every 6 (six) hours as needed (Nausea or vomiting). 06/11/2016: Pt has not picked-up RX yet.     . tamsulosin (FLOMAX) 0.4 MG CAPS capsule Take 0.4 mg by mouth daily after breakfast.    No facility-administered encounter medications on file as of 07/02/2016.     ALLERGIES:  No Known Allergies   PHYSICAL EXAM:  ECOG Performance status: 1 - Symptomatic, but independent.   Vitals:   07/02/16 1021  BP: (!) 155/74  Pulse: 86  Resp: 16  Temp: 98.2 F (36.8 C)   Filed Weights   07/02/16 1021  Weight: 187 lb 6.4 oz (85 kg)    Physical Exam  Constitutional: He is oriented to person, place, and time and well-developed, well-nourished, and in no distress.  HENT:  Head: Normocephalic.  Mouth/Throat: Oropharynx is clear and moist. No oropharyngeal exudate.  Eyes: Conjunctivae are normal. Pupils are equal, round, and reactive to light. No scleral icterus.  Neck: Normal range of motion. Neck supple.  Cardiovascular: Normal rate, regular rhythm and normal heart sounds.   Pulmonary/Chest: Effort normal and breath sounds normal. No respiratory distress. He has no wheezes.  Abdominal: Bowel sounds are normal. He exhibits distension. There is no  tenderness. There is no rebound.  Musculoskeletal: Normal range of motion. He exhibits no edema.  No CVA tenderness   Lymphadenopathy:    He has no cervical adenopathy.       Right: No supraclavicular adenopathy present.       Left: No supraclavicular adenopathy present.  Neurological: He is alert and oriented to person, place, and time. Gait normal.  Skin: Skin is warm and dry. No rash noted.  Psychiatric: Mood, memory, affect and judgment normal.     LABORATORY DATA:  I have reviewed the labs as listed.  CBC    Component Value Date/Time   WBC 6.5 07/02/2016 1016   RBC 4.08 (L) 07/02/2016 1016   HGB 13.3 07/02/2016 1016   HCT 37.9 (L) 07/02/2016 1016   HCT 40.1 02/01/2016 1007   PLT 261 07/02/2016 1016   PLT 328 02/01/2016 1007   MCV 92.9 07/02/2016 1016   MCV 93 02/01/2016 1007   MCH 32.6 07/02/2016 1016   MCHC 35.1 07/02/2016 1016   RDW 14.2 07/02/2016 1016   RDW 14.2 02/01/2016 1007   LYMPHSABS 0.6 (L) 07/02/2016 1016   LYMPHSABS 1.6 02/01/2016 1007   MONOABS 0.6 07/02/2016 1016   EOSABS 0.1 07/02/2016 1016   EOSABS 0.2 02/01/2016 1007   BASOSABS 0.0 07/02/2016 1016   BASOSABS 0.0 02/01/2016 1007   CMP Latest Ref Rng & Units 07/02/2016 06/25/2016 05/26/2016  Glucose 65 - 99 mg/dL 171(H) 274(H) 165(H)  BUN 6 - 20 mg/dL 17 17 15   Creatinine 0.61 - 1.24 mg/dL 0.88 0.86 0.91  Sodium 135 - 145 mmol/L 132(L) 134(L) 138  Potassium 3.5 - 5.1 mmol/L 3.7 3.7 4.1  Chloride 101 - 111 mmol/L 96(L) 99(L) 101  CO2 22 - 32 mmol/L 28 29 28   Calcium 8.9 - 10.3 mg/dL 9.0 9.0 9.2  Total Protein 6.5 - 8.1 g/dL 6.7 7.1 7.0  Total Bilirubin  0.3 - 1.2 mg/dL 0.9 0.3 0.4  Alkaline Phos 38 - 126 U/L 74 91 90  AST 15 - 41 U/L 17 19 17   ALT 17 - 63 U/L 17 16(L) 15(L)     PENDING LABS:    DIAGNOSTIC IMAGING:  PET scan: 04/24/16      PATHOLOGY:  Bronch: 05/28/16     ASSESSMENT & PLAN:   Stage IIB/IIIA NSCLC (T1b, N1-2, M0) right lung:  -Concurrent chemoradiation x 6 cycles  of weekly Carbo/Taxol. We will consider 2 additional cycles of Carbo/Taxol after completing radiation, depending on patient's response and tolerance to chemotherapy.  Then, we will proceed with consolidation therapy with Imfinzi based on NCCN Guidelines.  -Patient presented at [facility]. After discussion regarding patient's case, there were no treatment changes recommended at this time.  We will continue to monitor the left lung with serial imaging, as it remains suspicious for possible low-grade malignancy.  -Labs reviewed and are adequate for chemotherapy today. Proceed with cycle #2 Carbo/Taxol as scheduled. I have reduced his dose of Benadryl for his chemo pre-meds given his reported intolerance to full-dose IV Benadryl with cycle #1; treatment plan updated.  -Return to cancer center for follow-up and next cycle of chemo in 1 week.   Intermittent dysuria and urinary retention:  -Stat UA collected today before chemo and was negative. Urinary retention likely secondary to BPH. It is improving from recent days and is better today per patient report. Recommended he continue to push oral intake & follow-up with urology and PCP as directed.   Constipation:  -Recommended he continue stool softeners, but add Miralax OTC daily to help prevent constipation. Continue to increase oral fluid consumption, as tolerated. Recommended he drink the no sugar Gatorade alternative (G2) to maintain electrolytes and manage constipation.   Hyperglycemia/Nutrition education:  -Recommended he increase his protein intake, particularly during concurrent chemoradiation. Hyperglycemia is likely secondary to steroids with chemotherapy; glipizide on hold from PCP based on urinary symptoms. Mr. Yeatts has concerns about how best to manage his blood sugars, while sustaining his weight and energy during chemotherapy treatments.  -Referral to our oncology dietitian placed today.      Dispo:    -Return to cancer center for follow-up and next cycle of chemo in 1 week   All questions were answered to patient's stated satisfaction. Encouraged patient to call with any new concerns or questions before his next visit to the cancer center and we can certain see him sooner, if needed.    Plan of care discussed with Dr. Twana First, who agrees with the above aforementioned.     Orders placed this encounter:  Orders Placed This Encounter  Procedures  . Urine culture  . Urinalysis, Routine w reflex microscopic  . Amb Referral to Nutrition and Diabetic E      John Craze, NP [place] [phone]

## 2016-07-02 ENCOUNTER — Encounter (HOSPITAL_BASED_OUTPATIENT_CLINIC_OR_DEPARTMENT_OTHER): Payer: Medicare Other

## 2016-07-02 ENCOUNTER — Other Ambulatory Visit: Payer: Self-pay | Admitting: Family Medicine

## 2016-07-02 ENCOUNTER — Encounter (HOSPITAL_COMMUNITY): Payer: Self-pay | Admitting: Adult Health

## 2016-07-02 ENCOUNTER — Encounter: Payer: Self-pay | Admitting: Dietician

## 2016-07-02 ENCOUNTER — Encounter (HOSPITAL_BASED_OUTPATIENT_CLINIC_OR_DEPARTMENT_OTHER): Payer: Medicare Other | Admitting: Adult Health

## 2016-07-02 VITALS — BP 155/74 | HR 86 | Temp 98.2°F | Resp 16 | Ht 66.0 in | Wt 187.4 lb

## 2016-07-02 VITALS — BP 151/62 | HR 86 | Temp 97.9°F | Resp 18

## 2016-07-02 DIAGNOSIS — K5900 Constipation, unspecified: Secondary | ICD-10-CM

## 2016-07-02 DIAGNOSIS — R739 Hyperglycemia, unspecified: Secondary | ICD-10-CM | POA: Diagnosis not present

## 2016-07-02 DIAGNOSIS — Z5111 Encounter for antineoplastic chemotherapy: Secondary | ICD-10-CM | POA: Diagnosis present

## 2016-07-02 DIAGNOSIS — C3491 Malignant neoplasm of unspecified part of right bronchus or lung: Secondary | ICD-10-CM

## 2016-07-02 DIAGNOSIS — C3411 Malignant neoplasm of upper lobe, right bronchus or lung: Secondary | ICD-10-CM

## 2016-07-02 DIAGNOSIS — E785 Hyperlipidemia, unspecified: Secondary | ICD-10-CM | POA: Diagnosis not present

## 2016-07-02 DIAGNOSIS — Z510 Encounter for antineoplastic radiation therapy: Secondary | ICD-10-CM | POA: Diagnosis not present

## 2016-07-02 DIAGNOSIS — R300 Dysuria: Secondary | ICD-10-CM | POA: Diagnosis not present

## 2016-07-02 DIAGNOSIS — E1140 Type 2 diabetes mellitus with diabetic neuropathy, unspecified: Secondary | ICD-10-CM | POA: Diagnosis not present

## 2016-07-02 DIAGNOSIS — J449 Chronic obstructive pulmonary disease, unspecified: Secondary | ICD-10-CM | POA: Diagnosis not present

## 2016-07-02 DIAGNOSIS — I10 Essential (primary) hypertension: Secondary | ICD-10-CM | POA: Diagnosis not present

## 2016-07-02 DIAGNOSIS — R0602 Shortness of breath: Secondary | ICD-10-CM | POA: Diagnosis not present

## 2016-07-02 LAB — CBC WITH DIFFERENTIAL/PLATELET
BASOS PCT: 0 %
Basophils Absolute: 0 10*3/uL (ref 0.0–0.1)
EOS ABS: 0.1 10*3/uL (ref 0.0–0.7)
EOS PCT: 2 %
HCT: 37.9 % — ABNORMAL LOW (ref 39.0–52.0)
Hemoglobin: 13.3 g/dL (ref 13.0–17.0)
Lymphocytes Relative: 9 %
Lymphs Abs: 0.6 10*3/uL — ABNORMAL LOW (ref 0.7–4.0)
MCH: 32.6 pg (ref 26.0–34.0)
MCHC: 35.1 g/dL (ref 30.0–36.0)
MCV: 92.9 fL (ref 78.0–100.0)
MONO ABS: 0.6 10*3/uL (ref 0.1–1.0)
MONOS PCT: 9 %
Neutro Abs: 5.2 10*3/uL (ref 1.7–7.7)
Neutrophils Relative %: 80 %
Platelets: 261 10*3/uL (ref 150–400)
RBC: 4.08 MIL/uL — ABNORMAL LOW (ref 4.22–5.81)
RDW: 14.2 % (ref 11.5–15.5)
WBC: 6.5 10*3/uL (ref 4.0–10.5)

## 2016-07-02 LAB — MAGNESIUM: Magnesium: 1.8 mg/dL (ref 1.7–2.4)

## 2016-07-02 LAB — COMPREHENSIVE METABOLIC PANEL
ALBUMIN: 3.5 g/dL (ref 3.5–5.0)
ALT: 17 U/L (ref 17–63)
ANION GAP: 8 (ref 5–15)
AST: 17 U/L (ref 15–41)
Alkaline Phosphatase: 74 U/L (ref 38–126)
BILIRUBIN TOTAL: 0.9 mg/dL (ref 0.3–1.2)
BUN: 17 mg/dL (ref 6–20)
CO2: 28 mmol/L (ref 22–32)
Calcium: 9 mg/dL (ref 8.9–10.3)
Chloride: 96 mmol/L — ABNORMAL LOW (ref 101–111)
Creatinine, Ser: 0.88 mg/dL (ref 0.61–1.24)
GFR calc Af Amer: >60 mL/min (ref >60)
GFR calc non Af Amer: >60 mL/min (ref >60)
GLUCOSE: 171 mg/dL — AB (ref 65–99)
POTASSIUM: 3.7 mmol/L (ref 3.5–5.1)
SODIUM: 132 mmol/L — AB (ref 135–145)
TOTAL PROTEIN: 6.7 g/dL (ref 6.5–8.1)

## 2016-07-02 LAB — URINALYSIS, ROUTINE W REFLEX MICROSCOPIC
Bilirubin Urine: NEGATIVE
Glucose, UA: NEGATIVE mg/dL
Hgb urine dipstick: NEGATIVE
KETONES UR: NEGATIVE mg/dL
LEUKOCYTES UA: NEGATIVE
NITRITE: NEGATIVE
PH: 6 (ref 5.0–8.0)
Protein, ur: NEGATIVE mg/dL
Specific Gravity, Urine: 1.005 (ref 1.005–1.030)

## 2016-07-02 MED ORDER — SODIUM CHLORIDE 0.9 % IV SOLN
20.0000 mg | Freq: Once | INTRAVENOUS | Status: AC
  Administered 2016-07-02: 20 mg via INTRAVENOUS
  Filled 2016-07-02: qty 2

## 2016-07-02 MED ORDER — SODIUM CHLORIDE 0.9 % IV SOLN
215.8000 mg | Freq: Once | INTRAVENOUS | Status: AC
  Administered 2016-07-02: 220 mg via INTRAVENOUS
  Filled 2016-07-02: qty 22

## 2016-07-02 MED ORDER — DIPHENHYDRAMINE HCL 50 MG/ML IJ SOLN
INTRAMUSCULAR | Status: AC
  Filled 2016-07-02: qty 1

## 2016-07-02 MED ORDER — HEPARIN SOD (PORK) LOCK FLUSH 100 UNIT/ML IV SOLN
INTRAVENOUS | Status: AC
  Filled 2016-07-02: qty 5

## 2016-07-02 MED ORDER — PACLITAXEL CHEMO INJECTION 300 MG/50ML
45.0000 mg/m2 | Freq: Once | INTRAVENOUS | Status: AC
  Administered 2016-07-02: 90 mg via INTRAVENOUS
  Filled 2016-07-02: qty 15

## 2016-07-02 MED ORDER — SODIUM CHLORIDE 0.9 % IV SOLN
Freq: Once | INTRAVENOUS | Status: AC
  Administered 2016-07-02: 11:00:00 via INTRAVENOUS

## 2016-07-02 MED ORDER — SODIUM CHLORIDE 0.9% FLUSH
10.0000 mL | INTRAVENOUS | Status: DC | PRN
  Administered 2016-07-02: 10 mL
  Filled 2016-07-02: qty 10

## 2016-07-02 MED ORDER — DIPHENHYDRAMINE HCL 50 MG/ML IJ SOLN
25.0000 mg | Freq: Once | INTRAMUSCULAR | Status: AC
  Administered 2016-07-02: 25 mg via INTRAVENOUS

## 2016-07-02 MED ORDER — PALONOSETRON HCL INJECTION 0.25 MG/5ML
0.2500 mg | Freq: Once | INTRAVENOUS | Status: AC
  Administered 2016-07-02: 0.25 mg via INTRAVENOUS

## 2016-07-02 MED ORDER — PALONOSETRON HCL INJECTION 0.25 MG/5ML
INTRAVENOUS | Status: AC
  Filled 2016-07-02: qty 5

## 2016-07-02 MED ORDER — FAMOTIDINE IN NACL 20-0.9 MG/50ML-% IV SOLN
20.0000 mg | Freq: Once | INTRAVENOUS | Status: AC
  Administered 2016-07-02: 20 mg via INTRAVENOUS

## 2016-07-02 MED ORDER — HEPARIN SOD (PORK) LOCK FLUSH 100 UNIT/ML IV SOLN
500.0000 [IU] | Freq: Once | INTRAVENOUS | Status: AC | PRN
  Administered 2016-07-02: 500 [IU]

## 2016-07-02 MED ORDER — FAMOTIDINE IN NACL 20-0.9 MG/50ML-% IV SOLN
INTRAVENOUS | Status: AC
  Filled 2016-07-02: qty 50

## 2016-07-02 NOTE — Progress Notes (Signed)
[place] tolerated chemo tx well without complaints or incident. Labs and urine results reviewed with Dr. Talbert Cage prior to administering chemotherapy. VSS upon discharge. Pt discharged self ambulatory in satisfactory condition.

## 2016-07-02 NOTE — Progress Notes (Signed)
Nutrition Assessment  Reason for Assessment: Referral from NP on DM diet due to steroid induced hyperglycemia/diabetes. Has stage IIb/IIIa lung caner. Undergoing concomitant chemoradiation  ASSESSMENT: Current weight is 187 lbs. He is up a few lbs vs being stable since diagnosis. Wt has ranged 184-190 the last 3 months.   A1C done on 2/12 was 7.5. He says when he checks his BG at home they range from 150 to low 300's. Was started on glipizide but suffered urinary retention and stopped taking it.   Pt reportedly has some confusion. Has been reading labels and "everything has sugar in it".   Dietary Recall:  Breakfast: Usually skips. If does eat, will eat cheese toast.  Lunch: Something very small, like an apple or banana Dinner: Full home coked meal with meat, vegetable, starch Beverages: Water exclusively  Has had constipation  INTERVENTION:  Frankly, the patient is not eating enough. He essentially only eats 1 meal each day. He has a large amount of confusion about sugar and he does not seem to understand carbohydrates are sugar. He refuses to eat some beneficial foods, such as whole milk, due to the "sugar content".   Told patient that while blood sugar control is important, maintaining his nutritional status for treatment comes first. Whole milk is a great meal choice due to high kcal and protein content. Told him he SHOULD consume this.   A behavior that could improve his nutritional status AND his BG is not skipping meals. Educated how skipping meals can lead to lows and then when he eats, his BG will go very high because it had fasted so long. Asked him to eat ATLEAST 3x a day. He was agreeable.   Told pt that is fruit that he eats for lunch is just sugar. He is eating bananas on an empty stomach while can lead to high BG. Discussed how he should PAIR his sugar/carbs with protein to lessen postprandial glycemic level rise.   Showed picture of the Diabetic "My Plate". Asked him to  make >/= to 50% of meals protein, with ~25% from cabrs/starches/sugars and the other 25% from veggies if he desires these.   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 RD asked him to clarify what he meant and he stated "when you get bit by a tick, you can no longer eat red meat because of the lyme disease. I break out in hives".  RD stated he felt it would be ok for him to have some red meat. He said he may try a little.   RD provided handouts including the DM "My Plate" and "Type 2 Diabetes Nutrition Therapy from the "Academy of Nutrition and Dietetics"   Other than his meal skipping and poor pairing of food items, he does not seem to have many dietary factors that would predispose him to DM and it is likely more the medications that are causing his elevated BG. He has a lot of misconceptions about carbs, sugar and food in general. Definitely will need reinforcement   MONITORING, EVALUATION, GOAL: Blood glucose levels  NEXT VISIT: as needed/clinically warranted  Burtis Junes RD, LDN, CNSC Clinical Nutrition Pager: [phone] 07/02/2016 1:12 PM

## 2016-07-02 NOTE — Patient Instructions (Addendum)
[place] at [facility] Discharge Instructions  RECOMMENDATIONS MADE BY THE CONSULTANT AND ANY TEST RESULTS WILL BE SENT TO YOUR REFERRING PHYSICIAN.  You were seen today by [name] NP. Try taking one cap full of Miralax once a day to help with bowel movements. Try drinking G2 (low sugar gatorade) to keep hydrated. Return next week for follow up and treatment.    Thank you for choosing [place] at [facility] to provide your oncology and hematology care.  To afford each patient quality time with our provider, please arrive at least 15 minutes before your scheduled appointment time.    If you have a lab appointment with the [facility] please come in thru the  Main Entrance and check in at the main information desk  You need to re-schedule your appointment should you arrive 10 or more minutes late.  We strive to give you quality time with our providers, and arriving late affects you and other patients whose appointments are after yours.  Also, if you no show three or more times for appointments you may be dismissed from the clinic at the providers discretion.     Again, thank you for choosing [facility].  Our hope is that these requests will decrease the amount of time that you wait before being seen by our physicians.       _____________________________________________________________  Should you have questions after your visit to [facility], please contact our office at [phone] between the hours of 8:30 a.m. and 4:30 p.m.  Voicemails left after 4:30 p.m. will not be returned until the following business day.  For prescription refill requests, have your pharmacy contact our office.       Resources For Cancer Patients and their Caregivers ? American Cancer Society: Can assist with transportation, wigs, general needs, runs Look Good Feel Better.        330-303-5111 ? Cancer Care: Provides  financial assistance, online support groups, medication/co-pay assistance.  1-800-813-HOPE 971-051-9703) ? [place] Assists [place] cancer patients and their families through emotional , educational and financial support.  670-409-6036 ? [place] DSS Where to apply for food stamps, Medicaid and utility assistance. 7697541928 ? [facility]: Transportation to medical appointments. 484 779 7799 ? Social Security Administration: May apply for disability if have a Stage IV cancer. 4355243997 779-458-8153 ? [employer], Disability and Transit Services: Assists with nutrition, care and transit needs. [place] Support Programs: '@10RELATIVEDAYS'$ @ > Cancer Support Group  2nd Tuesday of the month 1pm-2pm, Journey Room  > Creative Journey  3rd Tuesday of the month 1130am-1pm, Journey Room  > Look Good Feel Better  1st Wednesday of the month 10am-12 noon, Journey Room (Call [facility] to register [phone])

## 2016-07-02 NOTE — Patient Instructions (Signed)
[facility] Discharge Instructions for Patients Receiving Chemotherapy   Beginning January 23rd 2017 lab work for the Cancer Center will be done in the  Main lab at [facility] on 1st floor. If you have a lab appointment with the Cancer Center please come in thru the  Main Entrance and check in at the main information desk   Today you received the following chemotherapy agents Taxol and Carboplatin. Follow-up as scheduled. Call clinic for any questions or concerns  To help prevent nausea and vomiting after your treatment, we encourage you to take your nausea medication.   If you develop nausea and vomiting, or diarrhea that is not controlled by your medication, call the clinic.  The clinic phone number is [phone]. Office hours are Monday-Friday 8:30am-5:00pm.  BELOW ARE SYMPTOMS THAT SHOULD BE REPORTED IMMEDIATELY:  *FEVER GREATER THAN 101.0 F  *CHILLS WITH OR WITHOUT FEVER  NAUSEA AND VOMITING THAT IS NOT CONTROLLED WITH YOUR NAUSEA MEDICATION  *UNUSUAL SHORTNESS OF BREATH  *UNUSUAL BRUISING OR BLEEDING  TENDERNESS IN MOUTH AND THROAT WITH OR WITHOUT PRESENCE OF ULCERS  *URINARY PROBLEMS  *BOWEL PROBLEMS  UNUSUAL RASH Items with * indicate a potential emergency and should be followed up as soon as possible. If you have an emergency after office hours please contact your primary care physician or go to the nearest emergency department.  Please call the clinic during office hours if you have any questions or concerns.   You may also contact the Patient Navigator at [phone] should you have any questions or need assistance in obtaining follow up care.      Resources For Cancer Patients and their Caregivers ? American Cancer Society: Can assist with transportation, wigs, general needs, runs Look Good Feel Better.        1-888-227-6333 ? Cancer Care: Provides financial assistance, online support groups, medication/co-pay assistance.   1-800-813-HOPE (4673) ? [facility] Assists [place] cancer patients and their families through emotional , educational and financial support.  336-427-4357 ? [place] DSS Where to apply for food stamps, Medicaid and utility assistance. 336-342-1394 ? [facility]: Transportation to medical appointments. 336-347-2287 ? Social Security Administration: May apply for disability if have a Stage IV cancer. 336-342-7796 1-800-772-1213 ? [place] [facility], Disability and Transit Services: Assists with nutrition, care and transit needs. 336-349-2343         

## 2016-07-03 DIAGNOSIS — Z510 Encounter for antineoplastic radiation therapy: Secondary | ICD-10-CM | POA: Diagnosis not present

## 2016-07-03 DIAGNOSIS — J449 Chronic obstructive pulmonary disease, unspecified: Secondary | ICD-10-CM | POA: Diagnosis not present

## 2016-07-03 DIAGNOSIS — E1140 Type 2 diabetes mellitus with diabetic neuropathy, unspecified: Secondary | ICD-10-CM | POA: Diagnosis not present

## 2016-07-03 DIAGNOSIS — I10 Essential (primary) hypertension: Secondary | ICD-10-CM | POA: Diagnosis not present

## 2016-07-03 DIAGNOSIS — E785 Hyperlipidemia, unspecified: Secondary | ICD-10-CM | POA: Diagnosis not present

## 2016-07-03 DIAGNOSIS — C3411 Malignant neoplasm of upper lobe, right bronchus or lung: Secondary | ICD-10-CM | POA: Diagnosis not present

## 2016-07-03 NOTE — Telephone Encounter (Signed)
Refill called to [facility] 

## 2016-07-04 LAB — URINE CULTURE: CULTURE: NO GROWTH

## 2016-07-07 DIAGNOSIS — Z510 Encounter for antineoplastic radiation therapy: Secondary | ICD-10-CM | POA: Diagnosis not present

## 2016-07-07 DIAGNOSIS — C3401 Malignant neoplasm of right main bronchus: Secondary | ICD-10-CM | POA: Diagnosis not present

## 2016-07-07 DIAGNOSIS — R1310 Dysphagia, unspecified: Secondary | ICD-10-CM | POA: Diagnosis not present

## 2016-07-08 ENCOUNTER — Inpatient Hospital Stay (HOSPITAL_COMMUNITY)
Admission: EM | Admit: 2016-07-08 | Discharge: 2016-07-10 | DRG: 181 | Disposition: A | Discharge status: Home / Self Care | Payer: Medicare Other | Attending: Internal Medicine | Admitting: Internal Medicine

## 2016-07-08 ENCOUNTER — Encounter (HOSPITAL_COMMUNITY): Payer: Medicare Other | Attending: Oncology | Admitting: Dietician

## 2016-07-08 ENCOUNTER — Other Ambulatory Visit: Payer: Self-pay

## 2016-07-08 ENCOUNTER — Encounter (HOSPITAL_COMMUNITY): Payer: Self-pay | Admitting: Emergency Medicine

## 2016-07-08 ENCOUNTER — Emergency Department (HOSPITAL_COMMUNITY): Payer: Medicare Other

## 2016-07-08 DIAGNOSIS — I110 Hypertensive heart disease with heart failure: Secondary | ICD-10-CM | POA: Diagnosis not present

## 2016-07-08 DIAGNOSIS — E119 Type 2 diabetes mellitus without complications: Secondary | ICD-10-CM

## 2016-07-08 DIAGNOSIS — R0602 Shortness of breath: Secondary | ICD-10-CM | POA: Insufficient documentation

## 2016-07-08 DIAGNOSIS — Z7982 Long term (current) use of aspirin: Secondary | ICD-10-CM

## 2016-07-08 DIAGNOSIS — M1990 Unspecified osteoarthritis, unspecified site: Secondary | ICD-10-CM | POA: Diagnosis present

## 2016-07-08 DIAGNOSIS — N419 Inflammatory disease of prostate, unspecified: Secondary | ICD-10-CM | POA: Diagnosis present

## 2016-07-08 DIAGNOSIS — Z79891 Long term (current) use of opiate analgesic: Secondary | ICD-10-CM

## 2016-07-08 DIAGNOSIS — Z791 Long term (current) use of non-steroidal anti-inflammatories (NSAID): Secondary | ICD-10-CM

## 2016-07-08 DIAGNOSIS — G4730 Sleep apnea, unspecified: Secondary | ICD-10-CM | POA: Diagnosis present

## 2016-07-08 DIAGNOSIS — Z8249 Family history of ischemic heart disease and other diseases of the circulatory system: Secondary | ICD-10-CM

## 2016-07-08 DIAGNOSIS — E785 Hyperlipidemia, unspecified: Secondary | ICD-10-CM | POA: Diagnosis not present

## 2016-07-08 DIAGNOSIS — J449 Chronic obstructive pulmonary disease, unspecified: Secondary | ICD-10-CM | POA: Diagnosis present

## 2016-07-08 DIAGNOSIS — F419 Anxiety disorder, unspecified: Secondary | ICD-10-CM | POA: Diagnosis present

## 2016-07-08 DIAGNOSIS — Z7951 Long term (current) use of inhaled steroids: Secondary | ICD-10-CM

## 2016-07-08 DIAGNOSIS — Z5111 Encounter for antineoplastic chemotherapy: Secondary | ICD-10-CM | POA: Insufficient documentation

## 2016-07-08 DIAGNOSIS — Z87442 Personal history of urinary calculi: Secondary | ICD-10-CM

## 2016-07-08 DIAGNOSIS — C3491 Malignant neoplasm of unspecified part of right bronchus or lung: Secondary | ICD-10-CM | POA: Insufficient documentation

## 2016-07-08 DIAGNOSIS — F17210 Nicotine dependence, cigarettes, uncomplicated: Secondary | ICD-10-CM | POA: Diagnosis present

## 2016-07-08 DIAGNOSIS — R509 Fever, unspecified: Secondary | ICD-10-CM | POA: Diagnosis not present

## 2016-07-08 DIAGNOSIS — R918 Other nonspecific abnormal finding of lung field: Secondary | ICD-10-CM | POA: Diagnosis not present

## 2016-07-08 DIAGNOSIS — R300 Dysuria: Secondary | ICD-10-CM | POA: Diagnosis not present

## 2016-07-08 DIAGNOSIS — N400 Enlarged prostate without lower urinary tract symptoms: Secondary | ICD-10-CM | POA: Diagnosis present

## 2016-07-08 DIAGNOSIS — Z510 Encounter for antineoplastic radiation therapy: Secondary | ICD-10-CM | POA: Diagnosis not present

## 2016-07-08 DIAGNOSIS — H3530 Unspecified macular degeneration: Secondary | ICD-10-CM | POA: Diagnosis present

## 2016-07-08 DIAGNOSIS — R739 Hyperglycemia, unspecified: Secondary | ICD-10-CM | POA: Insufficient documentation

## 2016-07-08 DIAGNOSIS — Z7984 Long term (current) use of oral hypoglycemic drugs: Secondary | ICD-10-CM

## 2016-07-08 DIAGNOSIS — I5032 Chronic diastolic (congestive) heart failure: Secondary | ICD-10-CM

## 2016-07-08 DIAGNOSIS — C3401 Malignant neoplasm of right main bronchus: Secondary | ICD-10-CM | POA: Diagnosis not present

## 2016-07-08 DIAGNOSIS — E1165 Type 2 diabetes mellitus with hyperglycemia: Secondary | ICD-10-CM

## 2016-07-08 DIAGNOSIS — C3481 Malignant neoplasm of overlapping sites of right bronchus and lung: Principal | ICD-10-CM | POA: Diagnosis present

## 2016-07-08 DIAGNOSIS — E1140 Type 2 diabetes mellitus with diabetic neuropathy, unspecified: Secondary | ICD-10-CM | POA: Diagnosis present

## 2016-07-08 DIAGNOSIS — M545 Low back pain: Secondary | ICD-10-CM | POA: Diagnosis present

## 2016-07-08 DIAGNOSIS — Z9842 Cataract extraction status, left eye: Secondary | ICD-10-CM

## 2016-07-08 DIAGNOSIS — R1310 Dysphagia, unspecified: Secondary | ICD-10-CM | POA: Diagnosis not present

## 2016-07-08 DIAGNOSIS — Z833 Family history of diabetes mellitus: Secondary | ICD-10-CM

## 2016-07-08 DIAGNOSIS — I509 Heart failure, unspecified: Secondary | ICD-10-CM

## 2016-07-08 DIAGNOSIS — Z9841 Cataract extraction status, right eye: Secondary | ICD-10-CM

## 2016-07-08 DIAGNOSIS — Z79899 Other long term (current) drug therapy: Secondary | ICD-10-CM

## 2016-07-08 DIAGNOSIS — I5033 Acute on chronic diastolic (congestive) heart failure: Secondary | ICD-10-CM

## 2016-07-08 LAB — COMPREHENSIVE METABOLIC PANEL
ALBUMIN: 3.4 g/dL — AB (ref 3.5–5.0)
ALT: 16 U/L — ABNORMAL LOW (ref 17–63)
ANION GAP: 7 (ref 5–15)
AST: 15 U/L (ref 15–41)
Alkaline Phosphatase: 78 U/L (ref 38–126)
BUN: 20 mg/dL (ref 6–20)
CALCIUM: 9 mg/dL (ref 8.9–10.3)
CO2: 29 mmol/L (ref 22–32)
Chloride: 97 mmol/L — ABNORMAL LOW (ref 101–111)
Creatinine, Ser: 0.9 mg/dL (ref 0.61–1.24)
GFR calc non Af Amer: >60 mL/min (ref >60)
GLUCOSE: 153 mg/dL — AB (ref 65–99)
POTASSIUM: 3.5 mmol/L (ref 3.5–5.1)
SODIUM: 133 mmol/L — AB (ref 135–145)
Total Bilirubin: 0.5 mg/dL (ref 0.3–1.2)
Total Protein: 6.6 g/dL (ref 6.5–8.1)

## 2016-07-08 LAB — CBC WITH DIFFERENTIAL/PLATELET
BASOS PCT: 0 %
Basophils Absolute: 0 10*3/uL (ref 0.0–0.1)
EOS ABS: 0.1 10*3/uL (ref 0.0–0.7)
EOS PCT: 2 %
HCT: 37.2 % — ABNORMAL LOW (ref 39.0–52.0)
Hemoglobin: 13 g/dL (ref 13.0–17.0)
LYMPHS ABS: 0.4 10*3/uL — AB (ref 0.7–4.0)
Lymphocytes Relative: 9 %
MCH: 32.7 pg (ref 26.0–34.0)
MCHC: 34.9 g/dL (ref 30.0–36.0)
MCV: 93.5 fL (ref 78.0–100.0)
MONOS PCT: 9 %
Monocytes Absolute: 0.4 10*3/uL (ref 0.1–1.0)
NEUTROS PCT: 80 %
Neutro Abs: 3.4 10*3/uL (ref 1.7–7.7)
PLATELETS: 231 10*3/uL (ref 150–400)
RBC: 3.98 MIL/uL — AB (ref 4.22–5.81)
RDW: 14.3 % (ref 11.5–15.5)
WBC: 4.2 10*3/uL (ref 4.0–10.5)

## 2016-07-08 LAB — PROTIME-INR
INR: 0.95
PROTHROMBIN TIME: 12.7 s (ref 11.4–15.2)

## 2016-07-08 LAB — I-STAT CG4 LACTIC ACID, ED: Lactic Acid, Venous: 0.57 mmol/L (ref 0.5–1.9)

## 2016-07-08 MED ORDER — SODIUM CHLORIDE 0.9 % IV BOLUS (SEPSIS)
250.0000 mL | Freq: Once | INTRAVENOUS | Status: AC
  Administered 2016-07-08: 250 mL via INTRAVENOUS

## 2016-07-08 MED ORDER — PIPERACILLIN-TAZOBACTAM 3.375 G IVPB 30 MIN
3.3750 g | Freq: Once | INTRAVENOUS | Status: AC
  Administered 2016-07-08: 3.375 g via INTRAVENOUS
  Filled 2016-07-08: qty 50

## 2016-07-08 MED ORDER — VANCOMYCIN HCL IN DEXTROSE 1-5 GM/200ML-% IV SOLN
1000.0000 mg | Freq: Once | INTRAVENOUS | Status: AC
  Administered 2016-07-08: 1000 mg via INTRAVENOUS
  Filled 2016-07-08: qty 200

## 2016-07-08 MED ORDER — SODIUM CHLORIDE 0.9 % IV SOLN
INTRAVENOUS | Status: DC
  Administered 2016-07-09 (×2): via INTRAVENOUS

## 2016-07-08 NOTE — ED Triage Notes (Signed)
Pt c/o fever and burning when he urinates. Pt states he done radiation today and has chemo tomorrow.

## 2016-07-08 NOTE — ED Notes (Signed)
Patient transported to X-ray 

## 2016-07-08 NOTE — Progress Notes (Signed)
Nutrition Follow-up:   ASSESSMENT: Pt had been referred for DM diet due to steroid induced hyperglycemia/diabetes. Has stage IIb/IIIa lung caner. Undergoing concomitant chemoradiation  Today, the patient is much more alert then when last seen 1 week ago. He has made many changes to his diet habits  Dietary Recall: Breakfast: Cheese/Butter toast. Almond milk Lunch: Misc. Atkins shakes Dinner: Hamburgers. Typically homestyle meal w/ meat, veg, starch Beverages: G2,Adkins Shakes, Crystal light  He says his constipation has resolved. In fact, he said he had a very large episode of diarrhea incontinence yesterday.   He has had some soreness of the throat r/t radiation. He says he can still eat just about anything, except very tough meats such as steak.   He thinks he lost weight. When asked why he thought this, says "Because my britches are looser".   Medications: Zofran, Dexamethasone  Labs: BG on 3/28 was 171. Pt says he checked his BG at home recent and it was 151.   Anthropometrics:  Height: 5'6"  Weight: 187lbs today. Wt is stab;e UBW: 184-190 x 3 months BMI: 30.2  NUTRITION DIAGNOSIS: Altered Nutrition Lab value (hyperglycemia) related to steroid therapy AEB BG >200s  MALNUTRITION DIAGNOSIS: N/A  INTERVENTION:  Commended patient on eating 3 meals per day now. He says his Wonda Cheng is typically not until 8 pm and is curious if he needs to eat between [employer] and [employer]. RD stated that he should do this.   He is drinking almond milk (unsure if sweet or not). He says he doesn't like this, but his sister told him to do it. RD recommended whole milk. He was happy to switch.   He sounds to be pairing his fruit with Pro/fat (PB). Told him this is good and to continue.   He is complaining of no sleep due to painful/difficult urination. He has a follow up with his provider who had prescribed the glipizide later this week. He is too talk to him about his urinary troubles.   RD made other  miscellaneous food recommendations for better glycemic control  He had many questions about specific foods. All answered.   From patients report, he seems to have changes that should help BG control.   Will monitor for now and can follow up as warranted.   MONITORING, EVALUATION, GOAL:  BG control   NEXT VISIT: As needed.   Burtis Junes RD, LDN, CNSC Clinical Nutrition Pager: [phone] 07/08/2016 12:16 PM

## 2016-07-09 ENCOUNTER — Ambulatory Visit (HOSPITAL_COMMUNITY): Payer: Medicare Other

## 2016-07-09 ENCOUNTER — Telehealth (HOSPITAL_COMMUNITY): Payer: Self-pay

## 2016-07-09 ENCOUNTER — Encounter (HOSPITAL_COMMUNITY): Payer: Self-pay | Admitting: Internal Medicine

## 2016-07-09 DIAGNOSIS — Z8249 Family history of ischemic heart disease and other diseases of the circulatory system: Secondary | ICD-10-CM | POA: Diagnosis not present

## 2016-07-09 DIAGNOSIS — Z833 Family history of diabetes mellitus: Secondary | ICD-10-CM | POA: Diagnosis not present

## 2016-07-09 DIAGNOSIS — N400 Enlarged prostate without lower urinary tract symptoms: Secondary | ICD-10-CM | POA: Diagnosis present

## 2016-07-09 DIAGNOSIS — R509 Fever, unspecified: Secondary | ICD-10-CM | POA: Diagnosis not present

## 2016-07-09 DIAGNOSIS — E119 Type 2 diabetes mellitus without complications: Secondary | ICD-10-CM

## 2016-07-09 DIAGNOSIS — E785 Hyperlipidemia, unspecified: Secondary | ICD-10-CM | POA: Diagnosis present

## 2016-07-09 DIAGNOSIS — R300 Dysuria: Secondary | ICD-10-CM | POA: Diagnosis not present

## 2016-07-09 DIAGNOSIS — C3491 Malignant neoplasm of unspecified part of right bronchus or lung: Secondary | ICD-10-CM

## 2016-07-09 DIAGNOSIS — Z79899 Other long term (current) drug therapy: Secondary | ICD-10-CM | POA: Diagnosis not present

## 2016-07-09 DIAGNOSIS — M545 Low back pain: Secondary | ICD-10-CM | POA: Diagnosis present

## 2016-07-09 DIAGNOSIS — Z9841 Cataract extraction status, right eye: Secondary | ICD-10-CM | POA: Diagnosis not present

## 2016-07-09 DIAGNOSIS — E1140 Type 2 diabetes mellitus with diabetic neuropathy, unspecified: Secondary | ICD-10-CM | POA: Diagnosis present

## 2016-07-09 DIAGNOSIS — Z7982 Long term (current) use of aspirin: Secondary | ICD-10-CM | POA: Diagnosis not present

## 2016-07-09 DIAGNOSIS — I509 Heart failure, unspecified: Secondary | ICD-10-CM

## 2016-07-09 DIAGNOSIS — Z79891 Long term (current) use of opiate analgesic: Secondary | ICD-10-CM | POA: Diagnosis not present

## 2016-07-09 DIAGNOSIS — G4730 Sleep apnea, unspecified: Secondary | ICD-10-CM | POA: Diagnosis present

## 2016-07-09 DIAGNOSIS — Z87442 Personal history of urinary calculi: Secondary | ICD-10-CM | POA: Diagnosis not present

## 2016-07-09 DIAGNOSIS — F419 Anxiety disorder, unspecified: Secondary | ICD-10-CM | POA: Diagnosis present

## 2016-07-09 DIAGNOSIS — H3530 Unspecified macular degeneration: Secondary | ICD-10-CM | POA: Diagnosis present

## 2016-07-09 DIAGNOSIS — I5033 Acute on chronic diastolic (congestive) heart failure: Secondary | ICD-10-CM

## 2016-07-09 DIAGNOSIS — I5032 Chronic diastolic (congestive) heart failure: Secondary | ICD-10-CM | POA: Diagnosis present

## 2016-07-09 DIAGNOSIS — Z7951 Long term (current) use of inhaled steroids: Secondary | ICD-10-CM | POA: Diagnosis not present

## 2016-07-09 DIAGNOSIS — C3481 Malignant neoplasm of overlapping sites of right bronchus and lung: Secondary | ICD-10-CM | POA: Diagnosis present

## 2016-07-09 DIAGNOSIS — E1165 Type 2 diabetes mellitus with hyperglycemia: Secondary | ICD-10-CM

## 2016-07-09 DIAGNOSIS — J449 Chronic obstructive pulmonary disease, unspecified: Secondary | ICD-10-CM | POA: Diagnosis present

## 2016-07-09 DIAGNOSIS — Z9842 Cataract extraction status, left eye: Secondary | ICD-10-CM | POA: Diagnosis not present

## 2016-07-09 DIAGNOSIS — Z791 Long term (current) use of non-steroidal anti-inflammatories (NSAID): Secondary | ICD-10-CM | POA: Diagnosis not present

## 2016-07-09 DIAGNOSIS — F17210 Nicotine dependence, cigarettes, uncomplicated: Secondary | ICD-10-CM | POA: Diagnosis present

## 2016-07-09 DIAGNOSIS — I110 Hypertensive heart disease with heart failure: Secondary | ICD-10-CM | POA: Diagnosis present

## 2016-07-09 DIAGNOSIS — Z7984 Long term (current) use of oral hypoglycemic drugs: Secondary | ICD-10-CM | POA: Diagnosis not present

## 2016-07-09 HISTORY — DX: Type 2 diabetes mellitus without complications: E11.9

## 2016-07-09 LAB — URINALYSIS, ROUTINE W REFLEX MICROSCOPIC
BACTERIA UA: NONE SEEN
BILIRUBIN URINE: NEGATIVE
GLUCOSE, UA: NEGATIVE mg/dL
HGB URINE DIPSTICK: NEGATIVE
KETONES UR: NEGATIVE mg/dL
LEUKOCYTES UA: NEGATIVE
NITRITE: NEGATIVE
PROTEIN: 30 mg/dL — AB
Specific Gravity, Urine: 1.016 (ref 1.005–1.030)
pH: 7 (ref 5.0–8.0)

## 2016-07-09 LAB — COMPREHENSIVE METABOLIC PANEL
ALT: 17 U/L (ref 17–63)
AST: 14 U/L — AB (ref 15–41)
Albumin: 3.3 g/dL — ABNORMAL LOW (ref 3.5–5.0)
Alkaline Phosphatase: 73 U/L (ref 38–126)
Anion gap: 7 (ref 5–15)
BILIRUBIN TOTAL: 0.6 mg/dL (ref 0.3–1.2)
BUN: 17 mg/dL (ref 6–20)
CO2: 30 mmol/L (ref 22–32)
CREATININE: 0.97 mg/dL (ref 0.61–1.24)
Calcium: 9 mg/dL (ref 8.9–10.3)
Chloride: 99 mmol/L — ABNORMAL LOW (ref 101–111)
GFR calc Af Amer: >60 mL/min (ref >60)
Glucose, Bld: 149 mg/dL — ABNORMAL HIGH (ref 65–99)
Potassium: 3.8 mmol/L (ref 3.5–5.1)
Sodium: 136 mmol/L (ref 135–145)
TOTAL PROTEIN: 6.6 g/dL (ref 6.5–8.1)

## 2016-07-09 LAB — CBC
HCT: 39.1 % (ref 39.0–52.0)
Hemoglobin: 13.5 g/dL (ref 13.0–17.0)
MCH: 32.3 pg (ref 26.0–34.0)
MCHC: 34.5 g/dL (ref 30.0–36.0)
MCV: 93.5 fL (ref 78.0–100.0)
PLATELETS: 240 10*3/uL (ref 150–400)
RBC: 4.18 MIL/uL — ABNORMAL LOW (ref 4.22–5.81)
RDW: 14.4 % (ref 11.5–15.5)
WBC: 4.4 10*3/uL (ref 4.0–10.5)

## 2016-07-09 LAB — GLUCOSE, CAPILLARY
GLUCOSE-CAPILLARY: 122 mg/dL — AB (ref 65–99)
GLUCOSE-CAPILLARY: 156 mg/dL — AB (ref 65–99)
Glucose-Capillary: 160 mg/dL — ABNORMAL HIGH (ref 65–99)
Glucose-Capillary: 136 mg/dL — ABNORMAL HIGH (ref 65–99)
Glucose-Capillary: 175 mg/dL — ABNORMAL HIGH (ref 65–99)

## 2016-07-09 LAB — INFLUENZA PANEL BY PCR (TYPE A & B)
Influenza A By PCR: NEGATIVE
Influenza B By PCR: NEGATIVE

## 2016-07-09 LAB — MRSA PCR SCREENING: MRSA by PCR: NEGATIVE

## 2016-07-09 MED ORDER — METOPROLOL SUCCINATE ER 50 MG PO TB24
50.0000 mg | ORAL_TABLET | Freq: Every day | ORAL | Status: DC
  Administered 2016-07-09 (×2): 50 mg via ORAL
  Filled 2016-07-09 (×2): qty 1

## 2016-07-09 MED ORDER — MAGIC MOUTHWASH
10.0000 mL | Freq: Three times a day (TID) | ORAL | Status: DC
  Administered 2016-07-09 – 2016-07-10 (×5): 10 mL via ORAL
  Filled 2016-07-09 (×5): qty 10

## 2016-07-09 MED ORDER — HYDROCODONE-ACETAMINOPHEN 5-325 MG PO TABS
1.0000 | ORAL_TABLET | Freq: Four times a day (QID) | ORAL | Status: DC | PRN
  Administered 2016-07-09: 1 via ORAL
  Filled 2016-07-09: qty 1

## 2016-07-09 MED ORDER — ACETAMINOPHEN 325 MG PO TABS
650.0000 mg | ORAL_TABLET | Freq: Four times a day (QID) | ORAL | Status: DC | PRN
  Administered 2016-07-09: 650 mg via ORAL
  Filled 2016-07-09: qty 2

## 2016-07-09 MED ORDER — TAMSULOSIN HCL 0.4 MG PO CAPS
0.4000 mg | ORAL_CAPSULE | Freq: Every day | ORAL | Status: DC
  Administered 2016-07-09 – 2016-07-10 (×2): 0.4 mg via ORAL
  Filled 2016-07-09 (×2): qty 1

## 2016-07-09 MED ORDER — ASPIRIN EC 81 MG PO TBEC
81.0000 mg | DELAYED_RELEASE_TABLET | Freq: Every day | ORAL | Status: DC
  Administered 2016-07-09 – 2016-07-10 (×2): 81 mg via ORAL
  Filled 2016-07-09 (×2): qty 1

## 2016-07-09 MED ORDER — FLUTICASONE PROPIONATE 50 MCG/ACT NA SUSP: 2.0000 | Freq: Every evening | NASAL | Status: DC | PRN

## 2016-07-09 MED ORDER — INSULIN ASPART 100 UNIT/ML ~~LOC~~ SOLN
0.0000 [IU] | Freq: Three times a day (TID) | SUBCUTANEOUS | Status: DC
  Administered 2016-07-09: 1 [IU] via SUBCUTANEOUS
  Administered 2016-07-09: 2 [IU] via SUBCUTANEOUS
  Administered 2016-07-10 (×2): 1 [IU] via SUBCUTANEOUS

## 2016-07-09 MED ORDER — PIPERACILLIN-TAZOBACTAM 3.375 G IVPB
3.3750 g | Freq: Three times a day (TID) | INTRAVENOUS | Status: DC
  Administered 2016-07-09 – 2016-07-10 (×4): 3.375 g via INTRAVENOUS
  Filled 2016-07-09 (×4): qty 50

## 2016-07-09 MED ORDER — LIDOCAINE VISCOUS 2 % MT SOLN
10.0000 mL | Freq: Three times a day (TID) | OROMUCOSAL | Status: DC
  Administered 2016-07-09 – 2016-07-10 (×5): 10 mL via OROMUCOSAL
  Filled 2016-07-09 (×5): qty 15

## 2016-07-09 MED ORDER — LORAZEPAM 0.5 MG PO TABS
0.5000 mg | ORAL_TABLET | ORAL | Status: DC | PRN
  Administered 2016-07-09 (×2): 0.5 mg via ORAL
  Filled 2016-07-09 (×2): qty 1

## 2016-07-09 MED ORDER — PIPERACILLIN-TAZOBACTAM 3.375 G IVPB
3.3750 g | Freq: Once | INTRAVENOUS | Status: AC
  Administered 2016-07-09: 3.375 g via INTRAVENOUS
  Filled 2016-07-09: qty 50

## 2016-07-09 MED ORDER — GABAPENTIN 300 MG PO CAPS
300.0000 mg | ORAL_CAPSULE | Freq: Every day | ORAL | Status: DC
  Administered 2016-07-09 – 2016-07-10 (×2): 300 mg via ORAL
  Filled 2016-07-09 (×2): qty 1

## 2016-07-09 MED ORDER — INSULIN ASPART 100 UNIT/ML ~~LOC~~ SOLN: 0.0000 [IU] | Freq: Every day | SUBCUTANEOUS | Status: DC

## 2016-07-09 MED ORDER — PRAVASTATIN SODIUM 40 MG PO TABS
40.0000 mg | ORAL_TABLET | ORAL | Status: DC
  Administered 2016-07-09: 40 mg via ORAL
  Filled 2016-07-09: qty 1

## 2016-07-09 MED ORDER — PANTOPRAZOLE SODIUM 40 MG PO TBEC
40.0000 mg | DELAYED_RELEASE_TABLET | Freq: Every day | ORAL | Status: DC
Start: 2016-07-09 — End: 2016-07-10
  Administered 2016-07-09 – 2016-07-10 (×2): 40 mg via ORAL
  Filled 2016-07-09 (×2): qty 1

## 2016-07-09 MED ORDER — DEXAMETHASONE 4 MG PO TABS: 8.0000 mg | ORAL_TABLET | Freq: Every day | ORAL | Status: DC

## 2016-07-09 MED ORDER — VANCOMYCIN HCL IN DEXTROSE 1-5 GM/200ML-% IV SOLN
1000.0000 mg | Freq: Two times a day (BID) | INTRAVENOUS | Status: DC
  Administered 2016-07-09 – 2016-07-10 (×3): 1000 mg via INTRAVENOUS
  Filled 2016-07-09 (×3): qty 200

## 2016-07-09 MED ORDER — HEPARIN SODIUM (PORCINE) 5000 UNIT/ML IJ SOLN
5000.0000 [IU] | Freq: Three times a day (TID) | INTRAMUSCULAR | Status: DC
  Administered 2016-07-09 – 2016-07-10 (×4): 5000 [IU] via SUBCUTANEOUS
  Filled 2016-07-09 (×4): qty 1

## 2016-07-09 MED ORDER — UMECLIDINIUM BROMIDE 62.5 MCG/INH IN AEPB
1.0000 | INHALATION_SPRAY | Freq: Every day | RESPIRATORY_TRACT | Status: DC
  Administered 2016-07-09 – 2016-07-10 (×2): 1 via RESPIRATORY_TRACT
  Filled 2016-07-09: qty 7

## 2016-07-09 MED ORDER — CLONIDINE HCL 0.1 MG PO TABS
0.1000 mg | ORAL_TABLET | Freq: Three times a day (TID) | ORAL | Status: DC
  Administered 2016-07-09 – 2016-07-10 (×5): 0.1 mg via ORAL
  Filled 2016-07-09 (×5): qty 1

## 2016-07-09 MED ORDER — ALBUTEROL SULFATE (2.5 MG/3ML) 0.083% IN NEBU: 3.0000 mL | INHALATION_SOLUTION | Freq: Four times a day (QID) | RESPIRATORY_TRACT | Status: DC | PRN

## 2016-07-09 MED ORDER — FINASTERIDE 5 MG PO TABS
5.0000 mg | ORAL_TABLET | Freq: Every day | ORAL | Status: DC
  Administered 2016-07-09 – 2016-07-10 (×2): 5 mg via ORAL
  Filled 2016-07-09 (×3): qty 1

## 2016-07-09 MED ORDER — GABAPENTIN 300 MG PO CAPS
600.0000 mg | ORAL_CAPSULE | Freq: Every day | ORAL | Status: DC
  Administered 2016-07-09 (×2): 600 mg via ORAL
  Filled 2016-07-09 (×2): qty 2

## 2016-07-09 MED ORDER — MAGIC MOUTHWASH W/LIDOCAINE: 10.0000 mL | Freq: Three times a day (TID) | ORAL | Status: DC

## 2016-07-09 MED ORDER — ONDANSETRON HCL 4 MG PO TABS: 8.0000 mg | ORAL_TABLET | Freq: Two times a day (BID) | ORAL | Status: DC | PRN

## 2016-07-09 MED ORDER — ACETAMINOPHEN 650 MG RE SUPP: 650.0000 mg | Freq: Four times a day (QID) | RECTAL | Status: DC | PRN

## 2016-07-09 NOTE — Care Management Note (Signed)
Case Management Note  Patient Details  Name: Husein Guedes MRN: [id number] Date of Birth: Sep 17, 1945  Subjective/Objective:  Adm with FUO. From home with significant other. Ind with ADL's, no DME or HH PTA. Has PCP, still drives to appointments, reports no issues affording medications. Undergoing chemo and radiation.                   Action/Plan: Anticipate DC home with self care.    Expected Discharge Date:  07/12/16               Expected Discharge Plan:  Home/Self Care  In-House Referral:     Discharge planning Services  CM Consult, NA  Post Acute Care Choice:    Choice offered to:  NA  DME Arranged:    DME Agency:     HH Arranged:    HH Agency:     Status of Service:  In process, will continue to follow  If discussed at Long Length of Stay Meetings, dates discussed:    Additional Comments:  Andora Krull, [place]un[place]y [place], RN 07/09/2016, 3:28 PM

## 2016-07-09 NOTE — Telephone Encounter (Signed)
Received call from ICU nurse because patient was admitted yesterday. Patient wanted to know what to do about missing his chemotherapy appointment. Instructed nurse to notify patient to call to reschedule when he is discharged. She verbalized understanding.

## 2016-07-09 NOTE — Progress Notes (Signed)
Patient admitted to the hospital earlier this morning by Dr. Marin Comment  Patient seen and examined. He does not feel well. Feels "run down". He is having some cough. Has some sore throat which he attributes to radiation. No vomiting or diarrhea. Exam is unremarkable  He was admitted to the hospital with fever or unclear etiology. Chest xray and urinalysis are unremarkable. He has a history of cancer and is on chemo and radiation. Blood and urine cultures are in process. He is on broad spectrum antibiotics. Will check influenza panel. Continue to monitor.   John [name]

## 2016-07-09 NOTE — Progress Notes (Signed)
ANTIBIOTIC CONSULT NOTE-Preliminary  Pharmacy Consult for Vancomycin and Zosyn Indication: Sepsis  No Known Allergies  Patient Measurements: Height: '5\' 6"'$  (167.6 cm) Weight: 181 lb 14.1 oz (82.5 kg) IBW/kg (Calculated) : 63.8   Vital Signs: Temp: 99 F (37.2 C) (04/04 0139) Temp Source: Oral (04/04 0139) BP: 138/78 (04/04 0500) Pulse Rate: 101 (04/04 0500)  Labs:  Recent Labs  07/08/16 2125 07/09/16 0443  WBC 4.2 4.4  HGB 13.0 13.5  PLT 231 240  CREATININE 0.90 0.97    Estimated Creatinine Clearance: 71.5 mL/min (by C-G formula based on SCr of 0.97 mg/dL).  No results for input(s): VANCOTROUGH, VANCOPEAK, VANCORANDOM, GENTTROUGH, GENTPEAK, GENTRANDOM, TOBRATROUGH, TOBRAPEAK, TOBRARND, AMIKACINPEAK, AMIKACINTROU, AMIKACIN in the last 72 hours.   Microbiology: Recent Results (from the past 720 hour(s))  Urine culture     Status: None   Collection Time: 07/02/16 10:59 AM  Result Value Ref Range Status   Specimen Description URINE, CLEAN CATCH  Final   Special Requests NONE  Final   Culture   Final    NO GROWTH Performed at [facility], [address] [place], [place] [address]    Report Status 07/04/2016 FINAL  Final  Culture, blood (Routine x 2)     Status: None (Preliminary result)   Collection Time: 07/08/16  9:25 PM  Result Value Ref Range Status   Specimen Description PORTA CATH  Final   Special Requests   Final    BOTTLES DRAWN AEROBIC AND ANAEROBIC Blood Culture results may not be optimal due to an excessive volume of blood received in culture bottles   Culture PENDING  Incomplete   Report Status PENDING  Incomplete  Culture, blood (Routine x 2)     Status: None (Preliminary result)   Collection Time: 07/08/16  9:34 PM  Result Value Ref Range Status   Specimen Description RIGHT ANTECUBITAL  Final   Special Requests   Final    BOTTLES DRAWN AEROBIC AND ANAEROBIC Blood Culture results may not be optimal due to an excessive volume of blood  received in culture bottles   Culture PENDING  Incomplete   Report Status PENDING  Incomplete  MRSA PCR Screening     Status: None   Collection Time: 07/09/16  1:28 AM  Result Value Ref Range Status   MRSA by PCR NEGATIVE NEGATIVE Final    Comment:        The GeneXpert MRSA Assay (FDA approved for NASAL specimens only), is one component of a comprehensive MRSA colonization surveillance program. It is not intended to diagnose MRSA infection nor to guide or monitor treatment for MRSA infections.     Medical History: Past Medical History:  Diagnosis Date  . Adenocarcinoma of lung, right ([place]) 04/18/2016  . Anxiety   . Arthritis   . CHF (congestive heart failure) ([place])   . COPD (chronic obstructive pulmonary disease) ([place])   . Depression   . Diabetes mellitus without complication ([facility])    no meds  . Dyspnea   . History of kidney stones   . Hyperlipidemia   . Hypertension   . Macular degeneration   . Neuropathy ([place])   . Non-small cell lung cancer, right ([place]) 04/18/2016  . Prostatitis   . Sleep apnea    cpap    Medications:  Vancomycin 1000 mg IV x 1 dose in the ED Zosyn 3.375 Gm IV x 1 dose in the ED  Assessment: 71 yo male seen in the ED chills and fever. Pt  currntly undergoing chemotherapy for adenocarcinoma of the R lung. Pt to be started on broadspectrum antibiotics for FUO.  Goal of Therapy:  Vancomycin troughs 15-20 mcg/ml Eradicate infection  Plan:  Preliminary review of pertinent patient information completed.  Protocol will be initiated with 1 dose of Zosyn 3.375 Gm IV @ 8 hours after the initial Ed dose.[place] clinical pharmacist will complete review during morning rounds to assess patient and finalize treatment regimen if needed.  Norberto Sorenson, [facility] 07/09/2016,6:28 AM

## 2016-07-09 NOTE — Subjective & Objective (Signed)
History and Physical    John [name] WUJ:[id number] DOB: 02-13-1946 DOA: 07/08/2016  PCP: [name], MD  Patient coming from: ***   Chief Complaint:   ***  HPI: John [name] is an 71 y.o. male    ED Course:  See above.  Rewiew of Systems:  Constitutional: Negative for malaise, fever and chills. No significant weight loss or weight gain Eyes: Negative for eye pain, redness and discharge, diplopia, visual changes, or flashes of light. ENMT: Negative for ear pain, hoarseness, nasal congestion, sinus pressure and sore throat. No headaches; tinnitus, drooling, or problem swallowing. Cardiovascular: Negative for chest pain, palpitations, diaphoresis, dyspnea and peripheral edema. ; No orthopnea, PND Respiratory: Negative for cough, hemoptysis, wheezing and stridor. No pleuritic chestpain. Gastrointestinal: Negative for diarrhea, constipation,  melena, blood in stool, hematemesis, jaundice and rectal bleeding.    Genitourinary: Negative for frequency, dysuria, incontinence,flank pain and hematuria; Musculoskeletal: Negative for back pain and neck pain. Negative for swelling and trauma.;  Skin: . Negative for pruritus, rash, abrasions, bruising and skin lesion.; ulcerations Neuro: Negative for headache, lightheadedness and neck stiffness. Negative for weakness, altered level of consciousness , altered mental status, extremity weakness, burning feet, involuntary movement, seizure and syncope.  Psych: negative for anxiety, depression, insomnia, tearfulness, panic attacks, hallucinations, paranoia, suicidal or homicidal ideation ***   Past Medical History:  Diagnosis Date  . Adenocarcinoma of lung, right ([place]) 04/18/2016  . Anxiety   . Arthritis   . CHF (congestive heart failure) ([place])   . COPD (chronic obstructive pulmonary disease) ([place])   . Depression   . Diabetes mellitus without complication (HCC)    no meds  . Dyspnea   . History of kidney stones   . Hyperlipidemia   .  Hypertension   . Macular degeneration   . Neuropathy ([place])   . Non-small cell lung cancer, right ([place]) 04/18/2016  . Prostatitis   . Sleep apnea    cpap    Rewiew of Systems:  Constitutional: Negative for malaise, fever and chills. No significant weight loss or weight gain Eyes: Negative for eye pain, redness and discharge, diplopia, visual changes, or flashes of light. ENMT: Negative for ear pain, hoarseness, nasal congestion, sinus pressure and sore throat. No headaches; tinnitus, drooling, or problem swallowing. Cardiovascular: Negative for chest pain, palpitations, diaphoresis, dyspnea and peripheral edema. ; No orthopnea, PND Respiratory: Negative for cough, hemoptysis, wheezing and stridor. No pleuritic chestpain. Gastrointestinal: Negative for nausea, vomiting, diarrhea, constipation, abdominal pain, melena, blood in stool, hematemesis, jaundice and rectal bleeding.    Genitourinary: Negative for frequency, dysuria, incontinence,flank pain and hematuria; Musculoskeletal: Negative for back pain and neck pain. Negative for swelling and trauma.;  Skin: . Negative for pruritus, rash, abrasions, bruising and skin lesion.; ulcerations Neuro: Negative for headache, lightheadedness and neck stiffness. Negative for weakness, altered level of consciousness , altered mental status, extremity weakness, burning feet, involuntary movement, seizure and syncope.  Psych: negative for anxiety, depression, insomnia, tearfulness, panic attacks, hallucinations, paranoia, suicidal or homicidal ideation ***  Past Surgical History:  Procedure Laterality Date  . CATARACT EXTRACTION, BILATERAL Bilateral   . NO PAST SURGERIES    . PORTACATH PLACEMENT Left 06/13/2016   Procedure: INSERTION PORT-A-CATH;  Surgeon: Aviva Signs, MD;  Location: [facility];  Service: General;  Laterality: Left;  Marland Kitchen VIDEO BRONCHOSCOPY WITH ENDOBRONCHIAL NAVIGATION N/A 05/28/2016   Procedure: VIDEO BRONCHOSCOPY WITH ENDOBRONCHIAL  NAVIGATION;  Surgeon: [name], MD;  Location: [facility];  Service: Thoracic;  Laterality: N/A;  .  VIDEO BRONCHOSCOPY WITH ENDOBRONCHIAL ULTRASOUND N/A 05/28/2016   Procedure: VIDEO BRONCHOSCOPY WITH ENDOBRONCHIAL ULTRASOUND;  Surgeon: [name], MD;  Location: [facility];  Service: Thoracic;  Laterality: N/A;     reports that he has been smoking Cigarettes.  He started smoking about 55 years ago. He has a 27.50 pack-year smoking history. He has never used smokeless tobacco. He reports that he does not drink alcohol or use drugs.  No Known Allergies  Family History  Problem Relation Age of Onset  . Hypertension Mother   . Diabetes Father   . Heart disease Father   . Hypertension Sister      Prior to Admission medications   Medication Sig Start Date End Date Taking? Authorizing Provider  albuterol (PROVENTIL HFA;VENTOLIN HFA) 108 (90 Base) MCG/ACT inhaler Inhale 2 puffs into the lungs every 6 (six) hours as needed for wheezing or shortness of breath. 06/05/16  Yes Baird Cancer, PA-C  aspirin EC 81 MG tablet Take 81 mg by mouth daily.   Yes Historical Provider, MD  cloNIDine (CATAPRES) 0.1 MG tablet Take 1 tablet (0.1 mg total) by mouth 3 (three) times daily. 05/19/16  Yes [name], MD  dexamethasone (DECADRON) 4 MG tablet Take 2 tablets (8 mg total) by mouth daily. Start the day after chemotherapy for 2 days. 06/10/16  Yes Twana First, MD  finasteride (PROSCAR) 5 MG tablet TAKE 1 TABLET EVERY DAY 06/03/16  Yes [name], MD  fluticasone [facility]) 50 MCG/ACT nasal spray Place 2 sprays into both nostrils daily. Patient taking differently: Place 2 sprays into both nostrils at bedtime as needed for allergies.  12/07/15  Yes Wardell Honour, MD  gabapentin (NEURONTIN) 300 MG capsule Take 300-600 mg by mouth 2 (two) times daily. Pt takes one capsule in the morning and two at bedtime.   Yes Historical Provider, MD  HYDROcodone-acetaminophen (NORCO) 5-325 MG tablet Take  1 tablet by mouth every 6 (six) hours as needed for moderate pain. 06/13/16  Yes Aviva Signs, MD  INCRUSE ELLIPTA 62.5 MCG/INH AEPB INHALE 1 PUFF INTO THE LUNGS DAILY. 05/09/16  Yes Wardell Honour, MD  irbesartan-hydrochlorothiazide (AVALIDE) 300-12.5 MG tablet Take 1 tablet by mouth daily. 04/17/16  Yes [name], MD  lidocaine-prilocaine (EMLA) cream Apply to affected area once 06/10/16  Yes Twana First, MD  LORazepam (ATIVAN) 0.5 MG tablet TAKE 1 TABLET BY MOUTH TWICE A DAY AS NEEDED 07/03/16  Yes [name], MD  meloxicam (MOBIC) 7.5 MG tablet TAKE 1 TABLET EVERY DAY FOR BACK PAIN Patient taking differently: TAKE 1 TABLET DAILY AS NEEDED FOR BACK PAIN 04/28/16  Yes [name], MD  metoprolol succinate (TOPROL-XL) 50 MG 24 hr tablet Take 1 tablet (50 mg total) by mouth daily. Take with or immediately following a meal. Patient taking differently: Take 50 mg by mouth at bedtime. Take with or immediately following a meal. 05/13/16  Yes [name], MD  ondansetron (ZOFRAN) 8 MG tablet Take 1 tablet (8 mg total) by mouth 2 (two) times daily as needed for refractory nausea / vomiting. Start on day 3 after chemo. 06/10/16  Yes Twana First, MD  pravastatin (PRAVACHOL) 40 MG tablet Take 40 mg by mouth every 3 (three) days. M-W-F 11/12/15  Yes Historical Provider, MD  prochlorperazine (COMPAZINE) 10 MG tablet Take 1 tablet (10 mg total) by mouth every 6 (six) hours as needed (Nausea or vomiting). 06/10/16  Yes Twana First, MD  tamsulosin (FLOMAX) 0.4 MG  CAPS capsule Take 0.4 mg by mouth daily after breakfast.   Yes Historical Provider, MD  blood glucose meter kit and supplies KIT Dispense based on patient and insurance preference. Use up to 2 times daily as directed. (FOR ICD-9 250.00, 250.01). 06/27/16   [name], MD  glipiZIDE (GLUCOTROL) 5 MG tablet Take 1 tablet (5 mg total) by mouth daily before breakfast. Patient not taking: Reported on 07/08/2016 06/27/16   [name], MD    omeprazole (PRILOSEC) 20 MG capsule Take 20 mg by mouth daily.  07/07/16   Historical Provider, MD    Physical Exam: Vitals:   07/08/16 2049 07/08/16 2050 07/08/16 2051 07/08/16 2336  BP:   (!) 189/98 138/78  Pulse: (!) 116   (!) 108  Resp: 20   20  Temp: (!) 100.6 F (38.1 C)   99.1 F (37.3 C)  TempSrc: Oral     SpO2: 95%   95%  Weight:  84.8 kg (187 lb)    Height:  5' 6"  (1.676 m)        Constitutional: NAD, calm, comfortable Vitals:   07/08/16 2049 07/08/16 2050 07/08/16 2051 07/08/16 2336  BP:   (!) 189/98 138/78  Pulse: (!) 116   (!) 108  Resp: 20   20  Temp: (!) 100.6 F (38.1 C)   99.1 F (37.3 C)  TempSrc: Oral     SpO2: 95%   95%  Weight:  84.8 kg (187 lb)    Height:  5' 6"  (1.676 m)     Eyes: PERRL, lids and conjunctivae normal ENMT: Mucous membranes are moist. Posterior pharynx clear of any exudate or lesions.Normal dentition.  Neck: normal, supple, no masses, no thyromegaly Respiratory: clear to auscultation bilaterally, no wheezing, no crackles. Normal respiratory effort. No accessory muscle use.  Cardiovascular: Regular rate and rhythm, no murmurs / rubs / gallops. No extremity edema. 2+ pedal pulses. No carotid bruits.  Abdomen: no tenderness, no masses palpated. No hepatosplenomegaly. Bowel sounds positive.  Musculoskeletal: no clubbing / cyanosis. No joint deformity upper and lower extremities. Good ROM, no contractures. Normal muscle tone.  Skin: no rashes, lesions, ulcers. No induration Neurologic: CN 2-12 grossly intact. Sensation intact, DTR normal. Strength 5/5 in all 4.  Psychiatric: Normal judgment and insight. Alert and oriented x 3. Normal mood.     Labs on Admission: I have personally reviewed following labs and imaging studies  CBC:  Recent Labs Lab 07/02/16 1016 07/08/16 2125  WBC 6.5 4.2  NEUTROABS 5.2 3.4  HGB 13.3 13.0  HCT 37.9* 37.2*  MCV 92.9 93.5  PLT 261 962   Basic Metabolic Panel:  Recent Labs Lab 07/02/16 1016  07/08/16 2125  NA 132* 133*  K 3.7 3.5  CL 96* 97*  CO2 28 29  GLUCOSE 171* 153*  BUN 17 20  CREATININE 0.88 0.90  CALCIUM 9.0 9.0  MG 1.8  --    GFR: Estimated Creatinine Clearance: 78 mL/min (by C-G formula based on SCr of 0.9 mg/dL). Liver Function Tests:  Recent Labs Lab 07/02/16 1016 07/08/16 2125  AST 17 15  ALT 17 16*  ALKPHOS 74 78  BILITOT 0.9 0.5  PROT 6.7 6.6  ALBUMIN 3.5 3.4*   No results for input(s): LIPASE, AMYLASE in the last 168 hours. No results for input(s): AMMONIA in the last 168 hours. Coagulation Profile:  Recent Labs Lab 07/08/16 2125  INR 0.95   Cardiac Enzymes: No results for input(s): CKTOTAL, CKMB, CKMBINDEX, TROPONINI in  the last 168 hours. BNP (last 3 results) No results for input(s): PROBNP in the last 8760 hours. HbA1C: No results for input(s): HGBA1C in the last 72 hours. CBG: No results for input(s): GLUCAP in the last 168 hours. Lipid Profile: No results for input(s): CHOL, HDL, LDLCALC, TRIG, CHOLHDL, LDLDIRECT in the last 72 hours. Thyroid Function Tests: No results for input(s): TSH, T4TOTAL, FREET4, T3FREE, THYROIDAB in the last 72 hours. Anemia Panel: No results for input(s): VITAMINB12, FOLATE, FERRITIN, TIBC, IRON, RETICCTPCT in the last 72 hours. Urine analysis:    Component Value Date/Time   COLORURINE YELLOW 07/08/2016 2315   APPEARANCEUR CLEAR 07/08/2016 2315   LABSPEC 1.016 07/08/2016 2315   PHURINE 7.0 07/08/2016 2315   GLUCOSEU NEGATIVE 07/08/2016 2315   HGBUR NEGATIVE 07/08/2016 2315   BILIRUBINUR NEGATIVE 07/08/2016 2315   KETONESUR NEGATIVE 07/08/2016 2315   PROTEINUR 30 (A) 07/08/2016 2315   NITRITE NEGATIVE 07/08/2016 2315   LEUKOCYTESUR NEGATIVE 07/08/2016 2315   Sepsis Labs: !!!!!!!!!!!!!!!!!!!!!!!!!!!!!!!!!!!!!!!!!!!! @LABRCNTIP (procalcitonin:4,lacticidven:4) ) Recent Results (from the past 240 hour(s))  Urine culture     Status: None   Collection Time: 07/02/16 10:59 AM  Result Value Ref  Range Status   Specimen Description URINE, CLEAN CATCH  Final   Special Requests NONE  Final   Culture   Final    NO GROWTH Performed at [facility], [place] [address], [place], [place] [address]    Report Status 07/04/2016 FINAL  Final  Culture, blood (Routine x 2)     Status: None (Preliminary result)   Collection Time: 07/08/16  9:25 PM  Result Value Ref Range Status   Specimen Description PORTA CATH  Final   Special Requests   Final    BOTTLES DRAWN AEROBIC AND ANAEROBIC Blood Culture results may not be optimal due to an excessive volume of blood received in culture bottles   Culture PENDING  Incomplete   Report Status PENDING  Incomplete  Culture, blood (Routine x 2)     Status: None (Preliminary result)   Collection Time: 07/08/16  9:34 PM  Result Value Ref Range Status   Specimen Description RIGHT ANTECUBITAL  Final   Special Requests   Final    BOTTLES DRAWN AEROBIC AND ANAEROBIC Blood Culture results may not be optimal due to an excessive volume of blood received in culture bottles   Culture PENDING  Incomplete   Report Status PENDING  Incomplete     Radiological Exams on Admission: Dg Chest 2 View  Result Date: 07/08/2016 CLINICAL DATA:  Currently undergoing radiation and chemotherapy for lung cancer. Now febrile. EXAM: CHEST  2 VIEW COMPARISON:  06/13/2016 FINDINGS: There is a left-sided port terminating in the low SVC. The lungs are clear. There is no pleural effusion. The pulmonary vasculature is normal. Heart size is normal. IMPRESSION: No consolidation or effusion. Electronically Signed   By: Andreas Newport M.D.   On: 07/08/2016 22:53    EKG: Independently reviewed. ***  Assessment/Plan Principal Problem:   Fever and chills Active Problems:   Adenocarcinoma of lung, right (HCC)   CHF (congestive heart failure) (HCC)   DM (diabetes mellitus) ([place])    PLAN:     DVT prophylaxis: ***  Code Status: ***  Family Communication: *** Disposition Plan:  *** Consults called: None. *** Admission status: Orvan Falconer MD FACP. [facility]  If 7PM-7AM, please contact night-coverage [email or web address] Password [email or web address]  07/09/2016, 12:41 AM

## 2016-07-09 NOTE — ED Provider Notes (Signed)
[facility] DEPT Provider Note   CSN: [id number] Arrival date & time: 07/08/16  2041     History   Chief Complaint Chief Complaint  Patient presents with  . Fever    HPI John [name] Due is a 71 y.o. male.  Patient with new diagnosis of adenocarcinoma the right lung with mediastinal involvement in January 2018. Patient started chemotherapy regiment March 21. Also receiving radiation treatment in [place]. Patient followed by hematology oncology here. Today patient with significant fever and chills all day long. Patient's wife came home from work felt he looked pale and ill. Patient without any specific complaints other than dysuria. Patient's also had a little bit of cough. Patient does have a Port-A-Cath in place. Past medical history is also significant for congestive heart failure and COPD. Patient denies any chest pain any significant shortness of breath or abdominal pain nausea or vomiting.      Past Medical History:  Diagnosis Date  . Adenocarcinoma of lung, right ([place]) 04/18/2016  . Anxiety   . Arthritis   . CHF (congestive heart failure) ([place])   . COPD (chronic obstructive pulmonary disease) ([place])   . Depression   . Diabetes mellitus without complication (H[facility])    no meds  . Dyspnea   . History of kidney stones   . Hyperlipidemia   . Hypertension   . Macular degeneration   . Neuropathy ([place])   . Non-small cell lung cancer, right ([place]) 04/18/2016  . Prostatitis   . Sleep apnea    cpap    Patient Active Problem List   Diagnosis Date Noted  . Malignant neoplasm of right lung ([place])   . Non-small cell lung cancer, right ([place]) 04/18/2016  . Essential hypertension 02/01/2016  . COPD (chronic obstructive pulmonary disease) ([place]) 02/01/2016  . BPH (benign prostatic hyperplasia) 02/01/2016    Past Surgical History:  Procedure Laterality Date  . CATARACT EXTRACTION, BILATERAL Bilateral   . NO PAST SURGERIES    . PORTACATH PLACEMENT Left 06/13/2016   Procedure:  INSERTION PORT-A-CATH;  Surgeon: Aviva Signs, MD;  Location: [facility];  Service: General;  Laterality: Left;  Marland Kitchen VIDEO BRONCHOSCOPY WITH ENDOBRONCHIAL NAVIGATION N/A 05/28/2016   Procedure: VIDEO BRONCHOSCOPY WITH ENDOBRONCHIAL NAVIGATION;  Surgeon: [name], MD;  Location: [facility];  Service: Thoracic;  Laterality: N/A;  . VIDEO BRONCHOSCOPY WITH ENDOBRONCHIAL ULTRASOUND N/A 05/28/2016   Procedure: VIDEO BRONCHOSCOPY WITH ENDOBRONCHIAL ULTRASOUND;  Surgeon: [name], MD;  Location: [facility];  Service: Thoracic;  Laterality: N/A;       Home Medications    Prior to Admission medications   Medication Sig Start Date End Date Taking? Authorizing Provider  albuterol (PROVENTIL HFA;VENTOLIN HFA) 108 (90 Base) MCG/ACT inhaler Inhale 2 puffs into the lungs every 6 (six) hours as needed for wheezing or shortness of breath. 06/05/16  Yes Baird Cancer, PA-C  aspirin EC 81 MG tablet Take 81 mg by mouth daily.   Yes Historical Provider, MD  cloNIDine (CATAPRES) 0.1 MG tablet Take 1 tablet (0.1 mg total) by mouth 3 (three) times daily. 05/19/16  Yes [name], MD  dexamethasone (DECADRON) 4 MG tablet Take 2 tablets (8 mg total) by mouth daily. Start the day after chemotherapy for 2 days. 06/10/16  Yes Twana First, MD  finasteride (PROSCAR) 5 MG tablet TAKE 1 TABLET EVERY DAY 06/03/16  Yes [name], MD  fluticasone [facility]) 50 MCG/ACT nasal spray Place 2 sprays into both nostrils daily. Patient taking differently: Place 2 sprays  into both nostrils at bedtime as needed for allergies.  12/07/15  Yes Wardell Honour, MD  gabapentin (NEURONTIN) 300 MG capsule Take 300-600 mg by mouth 2 (two) times daily. Pt takes one capsule in the morning and two at bedtime.   Yes Historical Provider, MD  HYDROcodone-acetaminophen (NORCO) 5-325 MG tablet Take 1 tablet by mouth every 6 (six) hours as needed for moderate pain. 06/13/16  Yes Aviva Signs, MD  INCRUSE ELLIPTA 62.5 MCG/INH AEPB INHALE 1  PUFF INTO THE LUNGS DAILY. 05/09/16  Yes Wardell Honour, MD  irbesartan-hydrochlorothiazide (AVALIDE) 300-12.5 MG tablet Take 1 tablet by mouth daily. 04/17/16  Yes [name], MD  lidocaine-prilocaine (EMLA) cream Apply to affected area once 06/10/16  Yes Twana First, MD  LORazepam (ATIVAN) 0.5 MG tablet TAKE 1 TABLET BY MOUTH TWICE A DAY AS NEEDED 07/03/16  Yes [name], MD  meloxicam (MOBIC) 7.5 MG tablet TAKE 1 TABLET EVERY DAY FOR BACK PAIN Patient taking differently: TAKE 1 TABLET DAILY AS NEEDED FOR BACK PAIN 04/28/16  Yes [name], MD  metoprolol succinate (TOPROL-XL) 50 MG 24 hr tablet Take 1 tablet (50 mg total) by mouth daily. Take with or immediately following a meal. Patient taking differently: Take 50 mg by mouth at bedtime. Take with or immediately following a meal. 05/13/16  Yes [name], MD  ondansetron (ZOFRAN) 8 MG tablet Take 1 tablet (8 mg total) by mouth 2 (two) times daily as needed for refractory nausea / vomiting. Start on day 3 after chemo. 06/10/16  Yes Twana First, MD  pravastatin (PRAVACHOL) 40 MG tablet Take 40 mg by mouth every 3 (three) days. M-W-F 11/12/15  Yes Historical Provider, MD  prochlorperazine (COMPAZINE) 10 MG tablet Take 1 tablet (10 mg total) by mouth every 6 (six) hours as needed (Nausea or vomiting). 06/10/16  Yes Twana First, MD  tamsulosin (FLOMAX) 0.4 MG CAPS capsule Take 0.4 mg by mouth daily after breakfast.   Yes Historical Provider, MD  blood glucose meter kit and supplies KIT Dispense based on patient and insurance preference. Use up to 2 times daily as directed. (FOR ICD-9 250.00, 250.01). 06/27/16   [name], MD  glipiZIDE (GLUCOTROL) 5 MG tablet Take 1 tablet (5 mg total) by mouth daily before breakfast. Patient not taking: Reported on 07/08/2016 06/27/16   [name], MD  omeprazole (PRILOSEC) 20 MG capsule Take 20 mg by mouth daily.  07/07/16   Historical Provider, MD    Family History Family History    Problem Relation Age of Onset  . Hypertension Mother   . Diabetes Father   . Heart disease Father   . Hypertension Sister     Social History Social History  Substance Use Topics  . Smoking status: Current Every Day Smoker    Packs/day: 0.50    Years: 55.00    Types: Cigarettes    Start date: 03/11/1961  . Smokeless tobacco: Never Used  . Alcohol use No     Allergies   Patient has no known allergies.   Review of Systems Review of Systems  Constitutional: Positive for chills, fatigue and fever.  HENT: Negative for congestion.   Eyes: Negative for visual disturbance.  Respiratory: Positive for cough. Negative for shortness of breath.   Cardiovascular: Negative for chest pain.  Gastrointestinal: Negative for abdominal pain.  Genitourinary: Positive for dysuria.  Musculoskeletal: Negative for myalgias.  Skin: Negative for rash.  Allergic/Immunologic: Positive for immunocompromised state.  Neurological:  Negative for headaches.  Hematological: Does not bruise/bleed easily.  Psychiatric/Behavioral: Negative for confusion.     Physical Exam Updated Vital Signs BP 138/78   Pulse (!) 108   Temp 99.1 F (37.3 C)   Resp 20   Ht _0  (1.676 m)   Wt 84.8 kg   SpO2 95%   BMI 30.18 kg/m   Physical Exam  Constitutional: He is oriented to person, place, and time. He appears well-developed and well-nourished. No distress.  HENT:  Head: Normocephalic and atraumatic.  Mouth/Throat: Oropharynx is clear and moist.  Eyes: Conjunctivae and EOM are normal. Pupils are equal, round, and reactive to light.  Neck: Normal range of motion. Neck supple.  Cardiovascular: Normal rate, regular rhythm and normal heart sounds.   Pulmonary/Chest: Effort normal and breath sounds normal.  Port-A-Cath chest.  Abdominal: Soft. Bowel sounds are normal.  Musculoskeletal: Normal range of motion.  Neurological: He is alert and oriented to person, place, and time. No cranial nerve deficit or  sensory deficit. He exhibits normal muscle tone. Coordination normal.  Nursing note and vitals reviewed.    ED Treatments / Results  Labs (all labs ordered are listed, but only abnormal results are displayed) Labs Reviewed  COMPREHENSIVE METABOLIC PANEL - Abnormal; Notable for the following:       Result Value   Sodium 133 (*)    Chloride 97 (*)    Glucose, Bld 153 (*)    Albumin 3.4 (*)    ALT 16 (*)    All other components within normal limits  CBC WITH DIFFERENTIAL/PLATELET - Abnormal; Notable for the following:    RBC 3.98 (*)    HCT 37.2 (*)    Lymphs Abs 0.4 (*)    All other components within normal limits  CULTURE, BLOOD (ROUTINE X 2)  CULTURE, BLOOD (ROUTINE X 2)  URINE CULTURE  PROTIME-INR  URINALYSIS, ROUTINE W REFLEX MICROSCOPIC  I-STAT CG4 LACTIC ACID, ED  I-STAT CG4 LACTIC ACID, ED  I-STAT CG4 LACTIC ACID, ED   Results for orders placed or performed during the hospital encounter of 07/08/16  Culture, blood (Routine x 2)  Result Value Ref Range   Specimen Description PORTA CATH    Special Requests      BOTTLES DRAWN AEROBIC AND ANAEROBIC Blood Culture results may not be optimal due to an excessive volume of blood received in culture bottles   Culture PENDING    Report Status PENDING   Culture, blood (Routine x 2)  Result Value Ref Range   Specimen Description RIGHT ANTECUBITAL    Special Requests      BOTTLES DRAWN AEROBIC AND ANAEROBIC Blood Culture results may not be optimal due to an excessive volume of blood received in culture bottles   Culture PENDING    Report Status PENDING   Comprehensive metabolic panel  Result Value Ref Range   Sodium 133 (L) 135 - 145 mmol/L   Potassium 3.5 3.5 - 5.1 mmol/L   Chloride 97 (L) 101 - 111 mmol/L   CO2 29 22 - 32 mmol/L   Glucose, Bld 153 (H) 65 - 99 mg/dL   BUN 20 6 - 20 mg/dL   Creatinine, Ser 0.90 0.61 - 1.24 mg/dL   Calcium 9.0 8.9 - 10.3 mg/dL   Total Protein 6.6 6.5 - 8.1 g/dL   Albumin 3.4 (L) 3.5 -  5.0 g/dL   AST 15 15 - 41 U/L   ALT 16 (L) 17 - 63 U/L   Alkaline Phosphatase  78 38 - 126 U/L   Total Bilirubin 0.5 0.3 - 1.2 mg/dL   GFR calc non Af Amer >60 >60 mL/min   GFR calc Af Amer >60 >60 mL/min   Anion gap 7 5 - 15  CBC with Differential  Result Value Ref Range   WBC 4.2 4.0 - 10.5 K/uL   RBC 3.98 (L) 4.22 - 5.81 MIL/uL   Hemoglobin 13.0 13.0 - 17.0 g/dL   HCT 92.1 (L) 88.6 - 36.1 %   MCV 93.5 78.0 - 100.0 fL   MCH 32.7 26.0 - 34.0 pg   MCHC 34.9 30.0 - 36.0 g/dL   RDW 10.3 85.1 - 14.6 %   Platelets 231 150 - 400 K/uL   Neutrophils Relative % 80 %   Neutro Abs 3.4 1.7 - 7.7 K/uL   Lymphocytes Relative 9 %   Lymphs Abs 0.4 (L) 0.7 - 4.0 K/uL   Monocytes Relative 9 %   Monocytes Absolute 0.4 0.1 - 1.0 K/uL   Eosinophils Relative 2 %   Eosinophils Absolute 0.1 0.0 - 0.7 K/uL   Basophils Relative 0 %   Basophils Absolute 0.0 0.0 - 0.1 K/uL  Protime-INR  Result Value Ref Range   Prothrombin Time 12.7 11.4 - 15.2 seconds   INR 0.95   Urinalysis, Routine w reflex microscopic  Result Value Ref Range   Color, Urine YELLOW YELLOW   APPearance CLEAR CLEAR   Specific Gravity, Urine 1.016 1.005 - 1.030   pH 7.0 5.0 - 8.0   Glucose, UA NEGATIVE NEGATIVE mg/dL   Hgb urine dipstick NEGATIVE NEGATIVE   Bilirubin Urine NEGATIVE NEGATIVE   Ketones, ur NEGATIVE NEGATIVE mg/dL   Protein, ur 30 (A) NEGATIVE mg/dL   Nitrite NEGATIVE NEGATIVE   Leukocytes, UA NEGATIVE NEGATIVE   RBC / HPF 0-5 0 - 5 RBC/hpf   WBC, UA 0-5 0 - 5 WBC/hpf   Bacteria, UA NONE SEEN NONE SEEN   Squamous Epithelial / LPF 0-5 (A) NONE SEEN   Mucous PRESENT   I-Stat CG4 Lactic Acid, ED  Result Value Ref Range   Lactic Acid, Venous 0.57 0.5 - 1.9 mmol/L     EKG  EKG Interpretation None       ED ECG REPORT   Date: 07/09/2016  Rate: 106  Rhythm: sinus tachycardia  QRS Axis: normal  Intervals: normal  ST/T Wave abnormalities: normal  Conduction Disutrbances:none  Narrative Interpretation:     Old EKG Reviewed: none available  I have personally reviewed the EKG tracing and agree with the computerized printout as noted.   Radiology Dg Chest 2 View  Result Date: 07/08/2016 CLINICAL DATA:  Currently undergoing radiation and chemotherapy for lung cancer. Now febrile. EXAM: CHEST  2 VIEW COMPARISON:  06/13/2016 FINDINGS: There is a left-sided port terminating in the low SVC. The lungs are clear. There is no pleural effusion. The pulmonary vasculature is normal. Heart size is normal. IMPRESSION: No consolidation or effusion. Electronically Signed   By: Ellery Plunk M.D.   On: 07/08/2016 22:53    Procedures Procedures (including critical care time)  CRITICAL CARE Performed by: Vanetta Mulders Total critical care time: 30 minutes Critical care time was exclusive of separately billable procedures and treating other patients. Critical care was necessary to treat or prevent imminent or life-threatening deterioration. Critical care was time spent personally by me on the following activities: development of treatment plan with patient and/or surrogate as well as nursing, discussions with consultants, evaluation of patient's response to treatment,  examination of patient, obtaining history from patient or surrogate, ordering and performing treatments and interventions, ordering and review of laboratory studies, ordering and review of radiographic studies, pulse oximetry and re-evaluation of patient's condition.   Medications Ordered in ED Medications  vancomycin (VANCOCIN) IVPB 1000 mg/200 mL premix (1,000 mg Intravenous New Bag/Given 07/08/16 2338)  0.9 %  sodium chloride infusion (not administered)  piperacillin-tazobactam (ZOSYN) IVPB 3.375 g (0 g Intravenous Stopped 07/08/16 2337)  sodium chloride 0.9 % bolus 250 mL (250 mLs Intravenous New Bag/Given 07/08/16 2329)     Initial Impression / Assessment and Plan / ED Course  I have reviewed the triage vital signs and the nursing  notes.  Pertinent labs & imaging results that were available during my care of the patient were reviewed by me and considered in my medical decision making (see chart for details).     Patient with diagnosis of adenocarcinoma in the right lung in January of this year. Is currently on a course of chemotherapy and radiation treatment. Chemotherapy started March 21. Due for chemotherapy and radiation tomorrow. Followed by hematology oncology here.  About a week ago patient was concerned about having a urinary tract infection based on symptoms but urinalysis was negative.  Patient still having some dysuria symptoms but today had fever and chills and according to wife looked very pale and ill at home. Upon arrival here patient with fever.  Patient's initial vital signs based on the temperature and and tachycardia met criteria for sepsis sepsis orders started. Patient without any known antibiotics.  Patient's lactic acid was not terribly elevated. Patient's white blood cell count was 4. Patient does have a Port-A-Cath in place. As possible source P chest x-ray without any acute findings. Patient's urinalysis still pending. Patient never hypotensive.  Due to the significant fever and chills all day blood cultures obviously are still pending we'll discuss with hospitalist team for admission.  Final Clinical Impressions(s) / ED Diagnoses   Final diagnoses:  Fever, unspecified fever cause  Dysuria  Fever and chills  Malignant neoplasm of overlapping sites of right lung [facility])    New Prescriptions New Prescriptions   No medications on file     Fredia Sorrow, MD 07/09/16 [phone]

## 2016-07-09 NOTE — Plan of Care (Signed)
Problem: Activity: Goal: Risk for activity intolerance will decrease Outcome: Progressing Patient is up and moving around in bed with no issues. He understands the need to move frequently.

## 2016-07-09 NOTE — H&P (Signed)
History and Physical    John [name] EOF:[id number] DOB: Jan 07, 1946 DOA: 07/08/2016  PCP: [name], MD  Patient coming from: Home.    Chief Complaint:  Fever and chills.   HPI: John [name] is an 71 y.o. male with hx of anxiety, adenocarcinoma of the right lung, nephrolithiasis, HTN, HLD, arthritis, undergoing chemotherapy, presented to the ER with subjective fever and chills.  He has no rigor, SOB, CP, but admitted to having a non productive coughs.  No myalgia, abdominal pain, or diarrhea.  Evaluation in the ER showed CXR without pna, WBC of 4K, Hb 13 g per dL, and platelet counts of 231K.  His LFTs were normal and renal Fx tests were unremarkable. BC were obtained, patient was placed on IV Van/Zosyn, and hospitalist was asked to admit him for fever of unclear source in a patient undergoing chemotherapy.    ED Course:  See above.  Rewiew of Systems:  Constitutional: Negative for malaise No significant weight loss or weight gain Eyes: Negative for eye pain, redness and discharge, diplopia, visual changes, or flashes of light. ENMT: Negative for ear pain, hoarseness, nasal congestion, sinus pressure and sore throat. No headaches; tinnitus, drooling, or problem swallowing. Cardiovascular: Negative for chest pain, palpitations, diaphoresis, dyspnea and peripheral edema. ; No orthopnea, PND Respiratory: Negative for cough, hemoptysis, wheezing and stridor. No pleuritic chestpain. Gastrointestinal: Negative for diarrhea, constipation,  melena, blood in stool, hematemesis, jaundice and rectal bleeding.    Genitourinary: Negative for frequency, dysuria, incontinence,flank pain and hematuria; Musculoskeletal: Negative for back pain and neck pain. Negative for swelling and trauma.;  Skin: . Negative for pruritus, rash, abrasions, bruising and skin lesion.; ulcerations Neuro: Negative for headache, lightheadedness and neck stiffness. Negative for weakness, altered level of consciousness ,  altered mental status, extremity weakness, burning feet, involuntary movement, seizure and syncope.  Psych: negative for anxiety, depression, insomnia, tearfulness, panic attacks, hallucinations, paranoia, suicidal or homicidal ideation    Past Medical History:  Diagnosis Date  . Adenocarcinoma of lung, right ([place]) 04/18/2016  . Anxiety   . Arthritis   . CHF (congestive heart failure) ([place])   . COPD (chronic obstructive pulmonary disease) ([place])   . Depression   . Diabetes mellitus without complication ([facility])    no meds  . Dyspnea   . History of kidney stones   . Hyperlipidemia   . Hypertension   . Macular degeneration   . Neuropathy ([place])   . Non-small cell lung cancer, right ([place]) 04/18/2016  . Prostatitis   . Sleep apnea    cpap   Past Surgical History:  Procedure Laterality Date  . CATARACT EXTRACTION, BILATERAL Bilateral   . NO PAST SURGERIES    . PORTACATH PLACEMENT Left 06/13/2016   Procedure: INSERTION PORT-A-CATH;  Surgeon: Aviva Signs, MD;  Location: [facility];  Service: General;  Laterality: Left;  Marland Kitchen VIDEO BRONCHOSCOPY WITH ENDOBRONCHIAL NAVIGATION N/A 05/28/2016   Procedure: VIDEO BRONCHOSCOPY WITH ENDOBRONCHIAL NAVIGATION;  Surgeon: [name], MD;  Location: [facility];  Service: Thoracic;  Laterality: N/A;  . VIDEO BRONCHOSCOPY WITH ENDOBRONCHIAL ULTRASOUND N/A 05/28/2016   Procedure: VIDEO BRONCHOSCOPY WITH ENDOBRONCHIAL ULTRASOUND;  Surgeon: [name], MD;  Location: [facility];  Service: Thoracic;  Laterality: N/A;     reports that he has been smoking Cigarettes.  He started smoking about 55 years ago. He has a 27.50 pack-year smoking history. He has never used smokeless tobacco. He reports that he does not drink alcohol or use drugs.  No Known Allergies  Family History  Problem Relation Age of Onset  . Hypertension Mother   . Diabetes Father   . Heart disease Father   . Hypertension Sister      Prior to Admission medications   Medication Sig  Start Date End Date Taking? Authorizing Provider  albuterol (PROVENTIL HFA;VENTOLIN HFA) 108 (90 Base) MCG/ACT inhaler Inhale 2 puffs into the lungs every 6 (six) hours as needed for wheezing or shortness of breath. 06/05/16  Yes Baird Cancer, PA-C  aspirin EC 81 MG tablet Take 81 mg by mouth daily.   Yes Historical Provider, MD  cloNIDine (CATAPRES) 0.1 MG tablet Take 1 tablet (0.1 mg total) by mouth 3 (three) times daily. 05/19/16  Yes [name], MD  dexamethasone (DECADRON) 4 MG tablet Take 2 tablets (8 mg total) by mouth daily. Start the day after chemotherapy for 2 days. 06/10/16  Yes Twana First, MD  finasteride (PROSCAR) 5 MG tablet TAKE 1 TABLET EVERY DAY 06/03/16  Yes [name], MD  fluticasone [facility]) 50 MCG/ACT nasal spray Place 2 sprays into both nostrils daily. Patient taking differently: Place 2 sprays into both nostrils at bedtime as needed for allergies.  12/07/15  Yes Wardell Honour, MD  gabapentin (NEURONTIN) 300 MG capsule Take 300-600 mg by mouth 2 (two) times daily. Pt takes one capsule in the morning and two at bedtime.   Yes Historical Provider, MD  HYDROcodone-acetaminophen (NORCO) 5-325 MG tablet Take 1 tablet by mouth every 6 (six) hours as needed for moderate pain. 06/13/16  Yes Aviva Signs, MD  INCRUSE ELLIPTA 62.5 MCG/INH AEPB INHALE 1 PUFF INTO THE LUNGS DAILY. 05/09/16  Yes Wardell Honour, MD  irbesartan-hydrochlorothiazide (AVALIDE) 300-12.5 MG tablet Take 1 tablet by mouth daily. 04/17/16  Yes [name], MD  lidocaine-prilocaine (EMLA) cream Apply to affected area once 06/10/16  Yes Twana First, MD  LORazepam (ATIVAN) 0.5 MG tablet TAKE 1 TABLET BY MOUTH TWICE A DAY AS NEEDED 07/03/16  Yes [name], MD  meloxicam (MOBIC) 7.5 MG tablet TAKE 1 TABLET EVERY DAY FOR BACK PAIN Patient taking differently: TAKE 1 TABLET DAILY AS NEEDED FOR BACK PAIN 04/28/16  Yes [name], MD  metoprolol succinate (TOPROL-XL) 50 MG 24 hr tablet Take 1  tablet (50 mg total) by mouth daily. Take with or immediately following a meal. Patient taking differently: Take 50 mg by mouth at bedtime. Take with or immediately following a meal. 05/13/16  Yes [name], MD  ondansetron (ZOFRAN) 8 MG tablet Take 1 tablet (8 mg total) by mouth 2 (two) times daily as needed for refractory nausea / vomiting. Start on day 3 after chemo. 06/10/16  Yes Twana First, MD  pravastatin (PRAVACHOL) 40 MG tablet Take 40 mg by mouth every 3 (three) days. M-W-F 11/12/15  Yes Historical Provider, MD  prochlorperazine (COMPAZINE) 10 MG tablet Take 1 tablet (10 mg total) by mouth every 6 (six) hours as needed (Nausea or vomiting). 06/10/16  Yes Twana First, MD  tamsulosin (FLOMAX) 0.4 MG CAPS capsule Take 0.4 mg by mouth daily after breakfast.   Yes Historical Provider, MD  blood glucose meter kit and supplies KIT Dispense based on patient and insurance preference. Use up to 2 times daily as directed. (FOR ICD-9 250.00, 250.01). 06/27/16   [name], MD  glipiZIDE (GLUCOTROL) 5 MG tablet Take 1 tablet (5 mg total) by mouth daily before breakfast. Patient not taking: Reported on 07/08/2016 06/27/16   Sherley Bounds  Wendi Snipes, MD  omeprazole (PRILOSEC) 20 MG capsule Take 20 mg by mouth daily.  07/07/16   Historical Provider, MD    Physical Exam: Vitals:   07/08/16 2051 07/08/16 2336 07/09/16 0106 07/09/16 0139  BP: (!) 189/98 138/78 (!) 149/83 (!) 165/76  Pulse:  (!) 108 (!) 105 (!) 106  Resp:  20 20 18   Temp:  99.1 F (37.3 C)  99 F (37.2 C)  TempSrc:    Oral  SpO2:  95% 95% 96%  Weight:    82.5 kg (181 lb 14.1 oz)  Height:    5' 6"  (1.676 m)      Constitutional: NAD, calm, comfortable Vitals:   07/08/16 2051 07/08/16 2336 07/09/16 0106 07/09/16 0139  BP: (!) 189/98 138/78 (!) 149/83 (!) 165/76  Pulse:  (!) 108 (!) 105 (!) 106  Resp:  20 20 18   Temp:  99.1 F (37.3 C)  99 F (37.2 C)  TempSrc:    Oral  SpO2:  95% 95% 96%  Weight:    82.5 kg (181 lb 14.1 oz)    Height:    5' 6"  (1.676 m)   Eyes: PERRL, lids and conjunctivae normal ENMT: Mucous membranes are moist. Posterior pharynx clear of any exudate or lesions.Normal dentition.  Neck: normal, supple, no masses, no thyromegaly Respiratory: clear to auscultation bilaterally, no wheezing, no crackles. Normal respiratory effort. No accessory muscle use.  Cardiovascular: Regular rate and rhythm, no murmurs / rubs / gallops. No extremity edema. 2+ pedal pulses. No carotid bruits.  Abdomen: no tenderness, no masses palpated. No hepatosplenomegaly. Bowel sounds positive.  Musculoskeletal: no clubbing / cyanosis. No joint deformity upper and lower extremities. Good ROM, no contractures. Normal muscle tone.  Skin: no rashes, lesions, ulcers. No induration Neurologic: CN 2-12 grossly intact. Sensation intact, DTR normal. Strength 5/5 in all 4.  Psychiatric: Normal judgment and insight. Alert and oriented x 3. Normal mood.    Labs on Admission: I have personally reviewed following labs and imaging studies  CBC:  Recent Labs Lab 07/02/16 1016 07/08/16 2125  WBC 6.5 4.2  NEUTROABS 5.2 3.4  HGB 13.3 13.0  HCT 37.9* 37.2*  MCV 92.9 93.5  PLT 261 939   Basic Metabolic Panel:  Recent Labs Lab 07/02/16 1016 07/08/16 2125  NA 132* 133*  K 3.7 3.5  CL 96* 97*  CO2 28 29  GLUCOSE 171* 153*  BUN 17 20  CREATININE 0.88 0.90  CALCIUM 9.0 9.0  MG 1.8  --    Liver Function Tests:  Recent Labs Lab 07/02/16 1016 07/08/16 2125  AST 17 15  ALT 17 16*  ALKPHOS 74 78  BILITOT 0.9 0.5  PROT 6.7 6.6  ALBUMIN 3.5 3.4*   Coagulation Profile:  Recent Labs Lab 07/08/16 2125  INR 0.95   Urine analysis:    Component Value Date/Time   COLORURINE YELLOW 07/08/2016 2315   APPEARANCEUR CLEAR 07/08/2016 2315   LABSPEC 1.016 07/08/2016 2315   [place] 7.0 07/08/2016 [place] 07/08/2016 2315   HGBUR NEGATIVE 07/08/2016 2315   BILIRUBINUR NEGATIVE 07/08/2016 2315   [place] 07/08/2016 2315   PROTEINUR 30 (A) 07/08/2016 2315   NITRITE NEGATIVE 07/08/2016 2315   LEUKOCYTESUR NEGATIVE 07/08/2016 2315    ) Recent Results (from the past 240 hour(s))  Urine culture     Status: None   Collection Time: 07/02/16 10:59 AM  Result Value Ref Range Status   Specimen Description URINE, CLEAN CATCH  Final  Special Requests NONE  Final   Culture   Final    NO GROWTH Performed at [facility], [place] [address] [place], [place] [address]    Report Status 07/04/2016 FINAL  Final  Culture, blood (Routine x 2)     Status: None (Preliminary result)   Collection Time: 07/08/16  9:25 PM  Result Value Ref Range Status   Specimen Description PORTA CATH  Final   Special Requests   Final    BOTTLES DRAWN AEROBIC AND ANAEROBIC Blood Culture results may not be optimal due to an excessive volume of blood received in culture bottles   Culture PENDING  Incomplete   Report Status PENDING  Incomplete  Culture, blood (Routine x 2)     Status: None (Preliminary result)   Collection Time: 07/08/16  9:34 PM  Result Value Ref Range Status   Specimen Description RIGHT ANTECUBITAL  Final   Special Requests   Final    BOTTLES DRAWN AEROBIC AND ANAEROBIC Blood Culture results may not be optimal due to an excessive volume of blood received in culture bottles   Culture PENDING  Incomplete   Report Status PENDING  Incomplete     Radiological Exams on Admission: Dg Chest 2 View  Result Date: 07/08/2016 CLINICAL DATA:  Currently undergoing radiation and chemotherapy for lung cancer. Now febrile. EXAM: CHEST  2 VIEW COMPARISON:  06/13/2016 FINDINGS: There is a left-sided port terminating in the low SVC. The lungs are clear. There is no pleural effusion. The pulmonary vasculature is normal. Heart size is normal. IMPRESSION: No consolidation or effusion. Electronically Signed   By: Andreas Newport M.D.   On: 07/08/2016 22:53    EKG: Independently  reviewed.  Assessment/Plan Principal Problem:   Fever and chills Active Problems:   Adenocarcinoma of lung, right (HCC)   CHF (congestive heart failure) (HCC)   DM (diabetes mellitus) ([place])    PLAN:   Fever of unclear source, but not septic:  Will continue with IV van/zosyn.  CXR and UA were negative.  Follow CBC, with diff in the am. Check blood culture.  He has a port.   Adenocarcinoma of the lung:  Just started chemotherapy.  Continue as per heme/onc.  DM:  Will continue with carb mod diet.  Use SSI as required.  CHF:  No signficant IVF given.  He is not hypotensive.  Compensated and not SOB.     DVT prophylaxis: hep SQ.  Code Status: FULL CODE.  Family Communication: wife at bedside.  Disposition Plan: to home when appropriate.  Consults called: None.  Admission status: OBS.    Aibhlinn Kalmar MD FACP. [facility]  If 7PM-7AM, please contact night-coverage [email or web address] Password [email or web address]  07/09/2016, 1:58 AM

## 2016-07-09 NOTE — Care Management Obs Status (Signed)
[place] NOTIFICATION   Patient Details  Name: Delfino Friesen MRN: [id number] Date of Birth: 08-01-1945   Medicare Observation Status Notification Given:  Yes    Graydon Fofana, Chauncey Reading, RN 07/09/2016, 3:28 PM

## 2016-07-10 LAB — BASIC METABOLIC PANEL
Anion gap: 8 (ref 5–15)
BUN: 17 mg/dL (ref 6–20)
CO2: 27 mmol/L (ref 22–32)
CREATININE: 1 mg/dL (ref 0.61–1.24)
Calcium: 8.3 mg/dL — ABNORMAL LOW (ref 8.9–10.3)
Chloride: 100 mmol/L — ABNORMAL LOW (ref 101–111)
GFR calc Af Amer: >60 mL/min (ref >60)
Glucose, Bld: 135 mg/dL — ABNORMAL HIGH (ref 65–99)
Potassium: 3.6 mmol/L (ref 3.5–5.1)
SODIUM: 135 mmol/L (ref 135–145)

## 2016-07-10 LAB — GLUCOSE, CAPILLARY
Glucose-Capillary: 131 mg/dL — ABNORMAL HIGH (ref 65–99)
Glucose-Capillary: 123 mg/dL — ABNORMAL HIGH (ref 65–99)
Glucose-Capillary: 150 mg/dL — ABNORMAL HIGH (ref 65–99)

## 2016-07-10 LAB — CBC
HCT: 34.7 % — ABNORMAL LOW (ref 39.0–52.0)
Hemoglobin: 11.8 g/dL — ABNORMAL LOW (ref 13.0–17.0)
MCH: 32.2 pg (ref 26.0–34.0)
MCHC: 34 g/dL (ref 30.0–36.0)
MCV: 94.6 fL (ref 78.0–100.0)
PLATELETS: 203 10*3/uL (ref 150–400)
RBC: 3.67 MIL/uL — ABNORMAL LOW (ref 4.22–5.81)
RDW: 14.5 % (ref 11.5–15.5)
WBC: 3.5 10*3/uL — ABNORMAL LOW (ref 4.0–10.5)

## 2016-07-10 LAB — URINE CULTURE

## 2016-07-10 MED ORDER — HEPARIN SOD (PORK) LOCK FLUSH 100 UNIT/ML IV SOLN
500.0000 [IU] | INTRAVENOUS | Status: AC | PRN
  Administered 2016-07-10: 500 [IU]
  Filled 2016-07-10: qty 5

## 2016-07-10 MED ORDER — LEVOFLOXACIN 500 MG PO TABS: 500.0000 mg | ORAL_TABLET | Freq: Every day | ORAL | 0 refills | Status: DC

## 2016-07-10 MED ORDER — TRAZODONE HCL 50 MG PO TABS: 50.0000 mg | ORAL_TABLET | Freq: Every evening | ORAL | 0 refills | Status: DC | PRN

## 2016-07-10 NOTE — Discharge Summary (Addendum)
Physician Discharge Summary  John [name] ONG:[id number] DOB: October 17, 1945 DOA: 07/08/2016  PCP: [name], MD  Admit date: 07/08/2016 Discharge date: 07/10/2016  Admitted From: home Disposition:  home  Recommendations for Outpatient Follow-up:  1. Follow up with PCP in 1-2 weeks 2. Please obtain BMP/CBC in one week 3. Follow up with oncology for chemo/radiation as scheduled  Home Health: Equipment/Devices:  Discharge Condition:stable CODE STATUS: full Diet recommendation: Heart Healthy   Brief/Interim Summary: This is a 71 year old male with a history of adenocarcinoma of the right lung, hypertension and hyperlipidemia who is undergoing chemotherapy, presented to the hospital with fever and chills. He did not have any significant leukocytosis on arrival. Otherwise renal function and liver function were noted to be unremarkable. Patient was started on vancomycin and Zosyn, blood cultures were drawn and he was admitted for further treatment. Chest x-ray was found to be unremarkable. Patient was evaluated in hospital and did not have any further significant fevers. He did complain of back pain and dysuria. It is possible that he may have an element of prostatitis. Once being on antibiotics, he reported improvement of his symptoms. Blood cultures have shown no growth at 48 hours. Urine culture shows multiple organisms. He'll be discharged on a course of Levaquin. He will plan to follow-up with hematology/oncology for further chemotherapy/radiation. The remainder of his medical problems remain stable.  Discharge Diagnoses:  Principal Problem:   Fever and chills Active Problems:   Adenocarcinoma of lung, right (HCC)   Chronic diastolic CHF (HCC)   DM (diabetes mellitus) ([place])    Discharge Instructions  Discharge Instructions    Diet - low sodium heart healthy    Complete by:  As directed    Increase activity slowly    Complete by:  As directed      Allergies as of 07/10/2016   No  Known Allergies     Medication List    TAKE these medications   albuterol 108 (90 Base) MCG/ACT inhaler Commonly known as:  PROVENTIL HFA;VENTOLIN HFA Inhale 2 puffs into the lungs every 6 (six) hours as needed for wheezing or shortness of breath.   aspirin EC 81 MG tablet Take 81 mg by mouth daily.   blood glucose meter kit and supplies Kit Dispense based on patient and insurance preference. Use up to 2 times daily as directed. (FOR ICD-9 250.00, 250.01).   cloNIDine 0.1 MG tablet Commonly known as:  CATAPRES Take 1 tablet (0.1 mg total) by mouth 3 (three) times daily.   dexamethasone 4 MG tablet Commonly known as:  DECADRON Take 2 tablets (8 mg total) by mouth daily. Start the day after chemotherapy for 2 days.   finasteride 5 MG tablet Commonly known as:  PROSCAR TAKE 1 TABLET EVERY DAY   fluticasone 50 MCG/ACT nasal spray Commonly known as:  FLONASE Place 2 sprays into both nostrils daily. What changed:  when to take this  reasons to take this   gabapentin 300 MG capsule Commonly known as:  NEURONTIN Take 300-600 mg by mouth 2 (two) times daily. Pt takes one capsule in the morning and two at bedtime.   glipiZIDE 5 MG tablet Commonly known as:  GLUCOTROL Take 1 tablet (5 mg total) by mouth daily before breakfast.   HYDROcodone-acetaminophen 5-325 MG tablet Commonly known as:  NORCO Take 1 tablet by mouth every 6 (six) hours as needed for moderate pain.   INCRUSE ELLIPTA 62.5 MCG/INH Aepb Generic drug:  umeclidinium bromide INHALE 1 PUFF INTO THE LUNGS  DAILY.   irbesartan-hydrochlorothiazide 300-12.5 MG tablet Commonly known as:  AVALIDE Take 1 tablet by mouth daily.   levofloxacin 500 MG tablet Commonly known as:  LEVAQUIN Take 1 tablet (500 mg total) by mouth daily.   lidocaine-prilocaine cream Commonly known as:  EMLA Apply to affected area once   LORazepam 0.5 MG tablet Commonly known as:  ATIVAN TAKE 1 TABLET BY MOUTH TWICE A DAY AS NEEDED    meloxicam 7.5 MG tablet Commonly known as:  MOBIC TAKE 1 TABLET EVERY DAY FOR BACK PAIN What changed:  See the new instructions.   metoprolol succinate 50 MG 24 hr tablet Commonly known as:  TOPROL-XL Take 1 tablet (50 mg total) by mouth daily. Take with or immediately following a meal. What changed:  when to take this  additional instructions   omeprazole 20 MG capsule Commonly known as:  PRILOSEC Take 20 mg by mouth daily.   ondansetron 8 MG tablet Commonly known as:  ZOFRAN Take 1 tablet (8 mg total) by mouth 2 (two) times daily as needed for refractory nausea / vomiting. Start on day 3 after chemo.   pravastatin 40 MG tablet Commonly known as:  PRAVACHOL Take 40 mg by mouth every 3 (three) days. M-W-F   prochlorperazine 10 MG tablet Commonly known as:  COMPAZINE Take 1 tablet (10 mg total) by mouth every 6 (six) hours as needed (Nausea or vomiting).   tamsulosin 0.4 MG Caps capsule Commonly known as:  FLOMAX Take 0.4 mg by mouth daily after breakfast.   traZODone 50 MG tablet Commonly known as:  DESYREL Take 1 tablet (50 mg total) by mouth at bedtime as needed for sleep.       No Known Allergies  Consultations:     Procedures/Studies: Dg Chest 2 View  Result Date: 07/08/2016 CLINICAL DATA:  Currently undergoing radiation and chemotherapy for lung cancer. Now febrile. EXAM: CHEST  2 VIEW COMPARISON:  06/13/2016 FINDINGS: There is a left-sided port terminating in the low SVC. The lungs are clear. There is no pleural effusion. The pulmonary vasculature is normal. Heart size is normal. IMPRESSION: No consolidation or effusion. Electronically Signed   By: Andreas Newport M.D.   On: 07/08/2016 22:53   Dg Chest Port 1 View  Result Date: 06/13/2016 CLINICAL DATA:  Left-sided port placement. EXAM: PORTABLE CHEST 1 VIEW COMPARISON:  None. FINDINGS: A left-sided port terminates in the central SVC. No pneumothorax. No other interval changes. Low lung volumes mildly  limit evaluation. IMPRESSION: The left-sided port is in good position.  No pneumothorax. Electronically Signed   By: Dorise Bullion III M.D   On: 06/13/2016 10:39   Dg C-arm 1-60 Min-no Report  Result Date: 06/13/2016 Fluoroscopy was utilized by the requesting physician.  No radiographic interpretation.       Subjective: Lower back pain and dysuria is better  Discharge Exam: Vitals:   07/09/16 2027 07/10/16 0500  BP:    Pulse:    Resp:    Temp: 99.7 F (37.6 C) 98 F (36.7 C)   Vitals:   07/09/16 2027 07/09/16 2237 07/10/16 0500 07/10/16 0746  BP:      Pulse:      Resp:      Temp: 99.7 F (37.6 C)  98 F (36.7 C)   TempSrc: Oral     SpO2:  92%  91%  Weight:      Height:        General: Pt is alert, awake, not in acute distress  Cardiovascular: RRR, S1/S2 +, no rubs, no gallops Respiratory: CTA bilaterally, no wheezing, no rhonchi Abdominal: Soft, NT, ND, bowel sounds + Extremities: no edema, no cyanosis    The results of significant diagnostics from this hospitalization (including imaging, microbiology, ancillary and laboratory) are listed below for reference.     Microbiology: Recent Results (from the past 240 hour(s))  Urine culture     Status: None   Collection Time: 07/02/16 10:59 AM  Result Value Ref Range Status   Specimen Description URINE, CLEAN CATCH  Final   Special Requests NONE  Final   Culture   Final    NO GROWTH Performed at [facility], [address] [place] [address]    Report Status 07/04/2016 FINAL  Final  Culture, blood (Routine x 2)     Status: None (Preliminary result)   Collection Time: 07/08/16  9:25 PM  Result Value Ref Range Status   Specimen Description PORTA CATH  Final   Special Requests   Final    BOTTLES DRAWN AEROBIC AND ANAEROBIC Blood Culture results may not be optimal due to an excessive volume of blood received in culture bottles   Culture NO GROWTH 2 DAYS  Final   Report Status PENDING  Incomplete   Culture, blood (Routine x 2)     Status: None (Preliminary result)   Collection Time: 07/08/16  9:34 PM  Result Value Ref Range Status   Specimen Description RIGHT ANTECUBITAL  Final   Special Requests   Final    BOTTLES DRAWN AEROBIC AND ANAEROBIC Blood Culture results may not be optimal due to an excessive volume of blood received in culture bottles   Culture NO GROWTH 2 DAYS  Final   Report Status PENDING  Incomplete  Urine culture     Status: Abnormal   Collection Time: 07/08/16 11:15 PM  Result Value Ref Range Status   Specimen Description URINE, CLEAN CATCH  Final   Special Requests NONE  Final   Culture MULTIPLE SPECIES PRESENT, SUGGEST RECOLLECTION (A)  Final   Report Status 07/10/2016 FINAL  Final  MRSA PCR Screening     Status: None   Collection Time: 07/09/16  1:28 AM  Result Value Ref Range Status   MRSA by PCR NEGATIVE NEGATIVE Final    Comment:        The GeneXpert MRSA Assay (FDA approved for NASAL specimens only), is one component of a comprehensive MRSA colonization surveillance program. It is not intended to diagnose MRSA infection nor to guide or monitor treatment for MRSA infections.      Labs: BNP (last 3 results) No results for input(s): BNP in the last 8760 hours. Basic Metabolic Panel:  Recent Labs Lab 07/08/16 2125 07/09/16 0443 07/10/16 0406  NA 133* 136 135  K 3.5 3.8 3.6  CL 97* 99* 100*  CO2 29 30 27   GLUCOSE 153* 149* 135*  BUN 20 17 17   CREATININE 0.90 0.97 1.00  CALCIUM 9.0 9.0 8.3*   Liver Function Tests:  Recent Labs Lab 07/08/16 2125 07/09/16 0443  AST 15 14*  ALT 16* 17  ALKPHOS 78 73  BILITOT 0.5 0.6  PROT 6.6 6.6  ALBUMIN 3.4* 3.3*   No results for input(s): LIPASE, AMYLASE in the last 168 hours. No results for input(s): AMMONIA in the last 168 hours. CBC:  Recent Labs Lab 07/08/16 2125 07/09/16 0443 07/10/16 0406  WBC 4.2 4.4 3.5*  NEUTROABS 3.4  --   --   HGB  13.0 13.5 11.8*  HCT 37.2* 39.1 34.7*   MCV 93.5 93.5 94.6  PLT 231 240 203   Cardiac Enzymes: No results for input(s): CKTOTAL, CKMB, CKMBINDEX, TROPONINI in the last 168 hours. BNP: Invalid input(s): POCBNP CBG:  Recent Labs Lab 07/09/16 1150 07/09/16 1640 07/09/16 2105 07/10/16 0758 07/10/16 1132  GLUCAP 175* 122* 156* 131* 123*   D-Dimer No results for input(s): DDIMER in the last 72 hours. Hgb A1c No results for input(s): HGBA1C in the last 72 hours. Lipid Profile No results for input(s): CHOL, HDL, LDLCALC, TRIG, CHOLHDL, LDLDIRECT in the last 72 hours. Thyroid function studies No results for input(s): TSH, T4TOTAL, T3FREE, THYROIDAB in the last 72 hours.  Invalid input(s): FREET3 Anemia work up No results for input(s): VITAMINB12, FOLATE, FERRITIN, TIBC, IRON, RETICCTPCT in the last 72 hours. Urinalysis    Component Value Date/Time   COLORURINE YELLOW 07/08/2016 2315   APPEARANCEUR CLEAR 07/08/2016 2315   LABSPEC 1.016 07/08/2016 2315   PHURINE 7.0 07/08/2016 2315   GLUCOSEU NEGATIVE 07/08/2016 2315   HGBUR NEGATIVE 07/08/2016 2315   BILIRUBINUR NEGATIVE 07/08/2016 2315   KETONESUR NEGATIVE 07/08/2016 2315   PROTEINUR 30 (A) 07/08/2016 2315   NITRITE NEGATIVE 07/08/2016 2315   LEUKOCYTESUR NEGATIVE 07/08/2016 2315   Sepsis Labs Invalid input(s): PROCALCITONIN,  WBC,  LACTICIDVEN Microbiology Recent Results (from the past 240 hour(s))  Urine culture     Status: None   Collection Time: 07/02/16 10:59 AM  Result Value Ref Range Status   Specimen Description URINE, CLEAN CATCH  Final   Special Requests NONE  Final   Culture   Final    NO GROWTH Performed at [facility], [place] [address] [place], [place] [address]    Report Status 07/04/2016 FINAL  Final  Culture, blood (Routine x 2)     Status: None (Preliminary result)   Collection Time: 07/08/16  9:25 PM  Result Value Ref Range Status   Specimen Description PORTA CATH  Final   Special Requests   Final    BOTTLES DRAWN AEROBIC AND  ANAEROBIC Blood Culture results may not be optimal due to an excessive volume of blood received in culture bottles   Culture NO GROWTH 2 DAYS  Final   Report Status PENDING  Incomplete  Culture, blood (Routine x 2)     Status: None (Preliminary result)   Collection Time: 07/08/16  9:34 PM  Result Value Ref Range Status   Specimen Description RIGHT ANTECUBITAL  Final   Special Requests   Final    BOTTLES DRAWN AEROBIC AND ANAEROBIC Blood Culture results may not be optimal due to an excessive volume of blood received in culture bottles   Culture NO GROWTH 2 DAYS  Final   Report Status PENDING  Incomplete  Urine culture     Status: Abnormal   Collection Time: 07/08/16 11:15 PM  Result Value Ref Range Status   Specimen Description URINE, CLEAN CATCH  Final   Special Requests NONE  Final   Culture MULTIPLE SPECIES PRESENT, SUGGEST RECOLLECTION (A)  Final   Report Status 07/10/2016 FINAL  Final  MRSA PCR Screening     Status: None   Collection Time: 07/09/16  1:28 AM  Result Value Ref Range Status   MRSA by PCR NEGATIVE NEGATIVE Final    Comment:        The GeneXpert MRSA Assay (FDA approved for NASAL specimens only), is one component of a comprehensive MRSA colonization surveillance program. It is not  intended to diagnose MRSA infection nor to guide or monitor treatment for MRSA infections.      Time coordinating discharge: Over 30 minutes  SIGNED:   Kathie Dike, MD  [facility] 07/10/2016, 3:56 PM Pager   If 7PM-7AM, please contact night-coverage [email or web address] Password [email or web address]

## 2016-07-10 NOTE — Progress Notes (Signed)
PT TRANSFERRING TO ROOM 314 AS MED/SURG. ALERT AND ORIENTED. LT CHEST PORTACATH ACCESSED AND IV INFUSING.Skin  WARM AND DRY. AFEBRILE. NO FURTHER CHILLS. TRANSFER REPORT CALLED TO MORGAN RN ON 300.

## 2016-07-11 ENCOUNTER — Telehealth (HOSPITAL_COMMUNITY): Payer: Self-pay

## 2016-07-11 ENCOUNTER — Ambulatory Visit: Payer: Medicare Other | Admitting: Family Medicine

## 2016-07-11 NOTE — Telephone Encounter (Signed)
Patient called stating he just got out of the hospital and wants to reschedule his chemo that he missed last Wednesday. Spoke with Elzie Rings, NP, she wants him to see a provider first. She said first to middle next week is fine and then he can be scheduled for further treatments from there. He called from [phone]. Message sent to scheduling.

## 2016-07-13 LAB — CULTURE, BLOOD (ROUTINE X 2)
CULTURE: NO GROWTH
Culture: NO GROWTH

## 2016-07-14 ENCOUNTER — Ambulatory Visit (INDEPENDENT_AMBULATORY_CARE_PROVIDER_SITE_OTHER): Payer: Medicare Other | Admitting: Family Medicine

## 2016-07-14 ENCOUNTER — Encounter: Payer: Self-pay | Admitting: Family Medicine

## 2016-07-14 VITALS — BP 140/67 | HR 96 | Temp 97.0°F | Ht 66.0 in | Wt 182.0 lb

## 2016-07-14 DIAGNOSIS — R1310 Dysphagia, unspecified: Secondary | ICD-10-CM | POA: Diagnosis not present

## 2016-07-14 DIAGNOSIS — R509 Fever, unspecified: Secondary | ICD-10-CM

## 2016-07-14 DIAGNOSIS — G479 Sleep disorder, unspecified: Secondary | ICD-10-CM

## 2016-07-14 DIAGNOSIS — I2510 Atherosclerotic heart disease of native coronary artery without angina pectoris: Secondary | ICD-10-CM

## 2016-07-14 DIAGNOSIS — C3401 Malignant neoplasm of right main bronchus: Secondary | ICD-10-CM | POA: Diagnosis not present

## 2016-07-14 DIAGNOSIS — E119 Type 2 diabetes mellitus without complications: Secondary | ICD-10-CM | POA: Diagnosis not present

## 2016-07-14 DIAGNOSIS — Z510 Encounter for antineoplastic radiation therapy: Secondary | ICD-10-CM | POA: Diagnosis not present

## 2016-07-14 MED ORDER — LINAGLIPTIN 5 MG PO TABS: 5.0000 mg | ORAL_TABLET | Freq: Every day | ORAL | 1 refills | Status: DC

## 2016-07-14 NOTE — Progress Notes (Signed)
   HPI  Patient presents today here for hospital follow-up.  Patient was admitted in the hospital quite fever, cause was not definitively found. History of impaired antibiotics and is finishing a course of Levaquin.  He feels he had difficulty urinating due to glipizide. This improved tablet days after stopping.  He feels better after being at home, however complains of some loose stools. He has 2 pills of levaquin left.  PMH: Smoking status noted ROS: Per HPI  Objective: BP 140/67   Pulse 96   Temp 97 F (36.1 C) (Oral)   Ht _0  (1.676 m)   Wt 182 lb (82.6 kg)   BMI 29.38 kg/m  Gen: NAD, alert, cooperative with exam HEENT: NCAT CV: RRR, good S1/S2, no murmur Resp: CTABL, no wheezes, non-labored Ext: No edema, warm Neuro: Alert and oriented, No gross deficits  Assessment and plan:  # Type 2 diabetes Did not tolerate glipizide, patient complains of symptoms of BPH with glipizide. 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 Finishing levaquin- likely causing loose stools  # difficulty sleeping Increase trazodone to 100 mg Consider ativan.     Orders Placed This Encounter  Procedures  . CBC with Differential/Platelet  . BMP8+EGFR    Meds ordered this encounter  Medications  . linagliptin (TRADJENTA) 5 MG TABS tablet    Sig: Take 1 tablet (5 mg total) by mouth daily.    Dispense:  30 tablet    Refill:  [place], MD [facility] 07/14/2016, 2:51 PM

## 2016-07-14 NOTE — Patient Instructions (Signed)
Great to see you!  Lets follow up in 3-4 weeks

## 2016-07-15 DIAGNOSIS — Z510 Encounter for antineoplastic radiation therapy: Secondary | ICD-10-CM | POA: Diagnosis not present

## 2016-07-15 DIAGNOSIS — C3401 Malignant neoplasm of right main bronchus: Secondary | ICD-10-CM | POA: Diagnosis not present

## 2016-07-15 DIAGNOSIS — R1310 Dysphagia, unspecified: Secondary | ICD-10-CM | POA: Diagnosis not present

## 2016-07-15 LAB — BMP8+EGFR
BUN/Creatinine Ratio: 23 (ref 10–24)
BUN: 18 mg/dL (ref 8–27)
CALCIUM: 9.2 mg/dL (ref 8.6–10.2)
CO2: 27 mmol/L (ref 18–29)
Chloride: 99 mmol/L (ref 96–106)
Creatinine, Ser: 0.78 mg/dL (ref 0.76–1.27)
GFR, EST AFRICAN AMERICAN: 106 mL/min/{1.73_m2} (ref >59)
GFR, EST NON AFRICAN AMERICAN: 91 mL/min/{1.73_m2} (ref >59)
Glucose: 189 mg/dL — ABNORMAL HIGH (ref 65–99)
Potassium: 3.7 mmol/L (ref 3.5–5.2)
Sodium: 140 mmol/L (ref 134–144)

## 2016-07-15 LAB — CBC WITH DIFFERENTIAL/PLATELET
Basophils Absolute: 0 10*3/uL (ref 0.0–0.2)
Basos: 1 %
EOS (ABSOLUTE): 0.1 10*3/uL (ref 0.0–0.4)
EOS: 2 %
HEMATOCRIT: 36.9 % — AB (ref 37.5–51.0)
HEMOGLOBIN: 12.5 g/dL — AB (ref 13.0–17.7)
IMMATURE GRANS (ABS): 0 10*3/uL (ref 0.0–0.1)
IMMATURE GRANULOCYTES: 0 %
LYMPHS: 12 %
Lymphocytes Absolute: 0.5 10*3/uL — ABNORMAL LOW (ref 0.7–3.1)
MCH: 31.2 pg (ref 26.6–33.0)
MCHC: 33.9 g/dL (ref 31.5–35.7)
MCV: 92 fL (ref 79–97)
MONOCYTES: 10 %
Monocytes Absolute: 0.4 10*3/uL (ref 0.1–0.9)
NEUTROS PCT: 75 %
Neutrophils Absolute: 3.3 10*3/uL (ref 1.4–7.0)
Platelets: 266 10*3/uL (ref 150–379)
RBC: 4.01 x10E6/uL — ABNORMAL LOW (ref 4.14–5.80)
RDW: 15.1 % (ref 12.3–15.4)
WBC: 4.4 10*3/uL (ref 3.4–10.8)

## 2016-07-16 ENCOUNTER — Encounter (HOSPITAL_COMMUNITY): Payer: Self-pay | Admitting: Oncology

## 2016-07-16 ENCOUNTER — Encounter (HOSPITAL_COMMUNITY): Payer: Self-pay

## 2016-07-16 ENCOUNTER — Encounter (HOSPITAL_BASED_OUTPATIENT_CLINIC_OR_DEPARTMENT_OTHER): Payer: Medicare Other

## 2016-07-16 ENCOUNTER — Encounter (HOSPITAL_BASED_OUTPATIENT_CLINIC_OR_DEPARTMENT_OTHER): Payer: Medicare Other | Admitting: Oncology

## 2016-07-16 VITALS — BP 133/72 | HR 95 | Temp 97.6°F | Resp 18 | Wt 183.0 lb

## 2016-07-16 DIAGNOSIS — C3491 Malignant neoplasm of unspecified part of right bronchus or lung: Secondary | ICD-10-CM

## 2016-07-16 DIAGNOSIS — R300 Dysuria: Secondary | ICD-10-CM | POA: Diagnosis not present

## 2016-07-16 DIAGNOSIS — R1310 Dysphagia, unspecified: Secondary | ICD-10-CM | POA: Diagnosis not present

## 2016-07-16 DIAGNOSIS — R918 Other nonspecific abnormal finding of lung field: Secondary | ICD-10-CM | POA: Insufficient documentation

## 2016-07-16 DIAGNOSIS — Z5111 Encounter for antineoplastic chemotherapy: Secondary | ICD-10-CM

## 2016-07-16 DIAGNOSIS — R911 Solitary pulmonary nodule: Secondary | ICD-10-CM

## 2016-07-16 DIAGNOSIS — R0602 Shortness of breath: Secondary | ICD-10-CM | POA: Diagnosis not present

## 2016-07-16 DIAGNOSIS — Z510 Encounter for antineoplastic radiation therapy: Secondary | ICD-10-CM | POA: Diagnosis not present

## 2016-07-16 DIAGNOSIS — R739 Hyperglycemia, unspecified: Secondary | ICD-10-CM | POA: Diagnosis not present

## 2016-07-16 DIAGNOSIS — C3401 Malignant neoplasm of right main bronchus: Secondary | ICD-10-CM | POA: Diagnosis not present

## 2016-07-16 DIAGNOSIS — C3400 Malignant neoplasm of unspecified main bronchus: Secondary | ICD-10-CM | POA: Diagnosis not present

## 2016-07-16 HISTORY — DX: Solitary pulmonary nodule: R91.1

## 2016-07-16 LAB — CBC WITH DIFFERENTIAL/PLATELET
Basophils Absolute: 0 10*3/uL (ref 0.0–0.1)
Basophils Relative: 1 %
Eosinophils Absolute: 0.1 10*3/uL (ref 0.0–0.7)
Eosinophils Relative: 2 %
HCT: 34.5 % — ABNORMAL LOW (ref 39.0–52.0)
HEMOGLOBIN: 12 g/dL — AB (ref 13.0–17.0)
LYMPHS ABS: 0.5 10*3/uL — AB (ref 0.7–4.0)
LYMPHS PCT: 11 %
MCH: 32.6 pg (ref 26.0–34.0)
MCHC: 34.8 g/dL (ref 30.0–36.0)
MCV: 93.8 fL (ref 78.0–100.0)
MONOS PCT: 16 %
Monocytes Absolute: 0.7 10*3/uL (ref 0.1–1.0)
NEUTROS PCT: 70 %
Neutro Abs: 3.2 10*3/uL (ref 1.7–7.7)
Platelets: 296 10*3/uL (ref 150–400)
RBC: 3.68 MIL/uL — AB (ref 4.22–5.81)
RDW: 14.5 % (ref 11.5–15.5)
WBC: 4.4 10*3/uL (ref 4.0–10.5)

## 2016-07-16 LAB — COMPREHENSIVE METABOLIC PANEL
ALBUMIN: 3.3 g/dL — AB (ref 3.5–5.0)
ALK PHOS: 78 U/L (ref 38–126)
ALT: 20 U/L (ref 17–63)
ANION GAP: 7 (ref 5–15)
AST: 15 U/L (ref 15–41)
BILIRUBIN TOTAL: 0.5 mg/dL (ref 0.3–1.2)
BUN: 18 mg/dL (ref 6–20)
CALCIUM: 9 mg/dL (ref 8.9–10.3)
CO2: 28 mmol/L (ref 22–32)
Chloride: 101 mmol/L (ref 101–111)
Creatinine, Ser: 0.77 mg/dL (ref 0.61–1.24)
GFR calc Af Amer: >60 mL/min (ref >60)
GLUCOSE: 144 mg/dL — AB (ref 65–99)
Potassium: 3.7 mmol/L (ref 3.5–5.1)
Sodium: 136 mmol/L (ref 135–145)
TOTAL PROTEIN: 6.7 g/dL (ref 6.5–8.1)

## 2016-07-16 LAB — MAGNESIUM: Magnesium: 1.9 mg/dL (ref 1.7–2.4)

## 2016-07-16 MED ORDER — PALONOSETRON HCL INJECTION 0.25 MG/5ML
INTRAVENOUS | Status: AC
  Filled 2016-07-16: qty 5

## 2016-07-16 MED ORDER — HEPARIN SOD (PORK) LOCK FLUSH 100 UNIT/ML IV SOLN
INTRAVENOUS | Status: AC
  Filled 2016-07-16: qty 5

## 2016-07-16 MED ORDER — SODIUM CHLORIDE 0.9 % IV SOLN
215.8000 mg | Freq: Once | INTRAVENOUS | Status: AC
  Administered 2016-07-16: 220 mg via INTRAVENOUS
  Filled 2016-07-16: qty 22

## 2016-07-16 MED ORDER — SODIUM CHLORIDE 0.9 % IV SOLN
Freq: Once | INTRAVENOUS | Status: AC
  Administered 2016-07-16: 12:00:00 via INTRAVENOUS

## 2016-07-16 MED ORDER — PALONOSETRON HCL INJECTION 0.25 MG/5ML
0.2500 mg | Freq: Once | INTRAVENOUS | Status: AC
  Administered 2016-07-16: 0.25 mg via INTRAVENOUS

## 2016-07-16 MED ORDER — SODIUM CHLORIDE 0.9% FLUSH: 10.0000 mL | INTRAVENOUS | Status: DC | PRN

## 2016-07-16 MED ORDER — DIPHENHYDRAMINE HCL 50 MG/ML IJ SOLN
INTRAMUSCULAR | Status: AC
  Filled 2016-07-16: qty 1

## 2016-07-16 MED ORDER — SODIUM CHLORIDE 0.9 % IV SOLN
20.0000 mg | Freq: Once | INTRAVENOUS | Status: AC
  Administered 2016-07-16: 20 mg via INTRAVENOUS
  Filled 2016-07-16: qty 2

## 2016-07-16 MED ORDER — PACLITAXEL CHEMO INJECTION 300 MG/50ML
45.0000 mg/m2 | Freq: Once | INTRAVENOUS | Status: AC
  Administered 2016-07-16: 90 mg via INTRAVENOUS
  Filled 2016-07-16: qty 15

## 2016-07-16 MED ORDER — HEPARIN SOD (PORK) LOCK FLUSH 100 UNIT/ML IV SOLN
500.0000 [IU] | Freq: Once | INTRAVENOUS | Status: AC | PRN
  Administered 2016-07-16: 500 [IU]

## 2016-07-16 MED ORDER — FAMOTIDINE IN NACL 20-0.9 MG/50ML-% IV SOLN
20.0000 mg | Freq: Once | INTRAVENOUS | Status: AC
  Administered 2016-07-16: 20 mg via INTRAVENOUS

## 2016-07-16 MED ORDER — FAMOTIDINE IN NACL 20-0.9 MG/50ML-% IV SOLN
INTRAVENOUS | Status: AC
  Filled 2016-07-16: qty 50

## 2016-07-16 MED ORDER — DIPHENHYDRAMINE HCL 50 MG/ML IJ SOLN
25.0000 mg | Freq: Once | INTRAMUSCULAR | Status: AC
  Administered 2016-07-16: 25 mg via INTRAVENOUS

## 2016-07-16 NOTE — Patient Instructions (Signed)
[facility] Discharge Instructions for Patients Receiving Chemotherapy   Beginning January 23rd 2017 lab work for the [facility] will be done in the  Main lab at [facility] on 1st floor. If you have a lab appointment with the [facility] please come in thru the  Main Entrance and check in at the main information desk   Today you received the following chemotherapy agents: Taxol and Carboplatin   If you develop nausea and vomiting, or diarrhea that is not controlled by your medication, call the clinic.  The clinic phone number is [phone]. Office hours are Monday-Friday 8:30am-5:00pm.  BELOW ARE SYMPTOMS THAT SHOULD BE REPORTED IMMEDIATELY:  *FEVER GREATER THAN 101.0 F  *CHILLS WITH OR WITHOUT FEVER  NAUSEA AND VOMITING THAT IS NOT CONTROLLED WITH YOUR NAUSEA MEDICATION  *UNUSUAL SHORTNESS OF BREATH  *UNUSUAL BRUISING OR BLEEDING  TENDERNESS IN MOUTH AND THROAT WITH OR WITHOUT PRESENCE OF ULCERS  *URINARY PROBLEMS  *BOWEL PROBLEMS  UNUSUAL RASH Items with * indicate a potential emergency and should be followed up as soon as possible. If you have an emergency after office hours please contact your primary care physician or go to the nearest emergency department.  Please call the clinic during office hours if you have any questions or concerns.   You may also contact the Patient Navigator at [phone] should you have any questions or need assistance in obtaining follow up care.      Resources For Cancer Patients and their Caregivers ? American Cancer Society: Can assist with transportation, wigs, general needs, runs Look Good Feel Better.        878-159-0624 ? Cancer Care: Provides financial assistance, online support groups, medication/co-pay assistance.  1-800-813-HOPE (475)354-8192) ? [place] Assists [place] cancer patients and their families through emotional , educational and financial support.   223-470-8152 ? [place] DSS Where to apply for food stamps, Medicaid and utility assistance. 947-040-7740 ? [facility]: Transportation to medical appointments. 260-186-9011 ? Social Security Administration: May apply for disability if have a Stage IV cancer. 9868814371 220-391-4510 ? [employer], Disability and Transit Services: Assists with nutrition, care and transit needs. (628)257-3237

## 2016-07-16 NOTE — Progress Notes (Signed)
Patient c/o his tongue being sore and that he has started magic mouthwash as well as Nystatin swish and swallow per RadOnc MD.  States that this gives him relief and that he is able to eat without any change in diet or intake amounts.  PA-C aware of this and ok for patient to have treatment, as well as he has reviewed labs and states patient is good for treatment today.  Onc nurse to assess neuropathy to feet with each additional visit and report to provider for possible treatment change.  Patient tolerated infusion well.  VSS.  Patient ambulatory and stable upon discharge from clinic.

## 2016-07-16 NOTE — Progress Notes (Signed)
[name], MD [place] [id number]  Non-small cell lung cancer, right [facility])  Pulmonary nodule, left  CURRENT THERAPY: Concomitant chemoradiation beginning on 06/25/2016 with weekly Carboplatin/Paclitaxel.  INTERVAL HISTORY: John [name] 71 y.o. male returns for followup of NSCLC of right lung with hilar and paratracheal lymphadenopathy on PET imaging, favoring adenocarcinoma.  Complicated staging situation but in giving the benefit of the doubt, he is staged as a Stage IIB (T1bN1M0) with two adjacent RUL pulmonary nodules, RIGHT-sided lymphadenopathy as described AND a LUL pulmonary nodule measuring 11 mm with low metabolic activity biopsied but without any malignancy identified.  Currently undergoing concomitant chemoradiation with weekly Carboplatin/Paclitaxel beginning on 06/25/2016.    Non-small cell lung cancer, right ([name])   04/17/2016 Imaging    CT chest- 1. Examination is positive for bilateral upper lobe pulmonary lesions suspicious for malignancy. Further investigation with PET-CT and tissue sampling is advised. 2. Borderline enlarged paratracheal lymph nodes and right hilar nodes. Cannot rule out metastatic adenopathy. Attention to these areas on PET-CT is advised. 3. Emphysema 4. Aortic atherosclerosis and multi vessel coronary artery calcification.      04/18/2016 Initial Diagnosis    Adenocarcinoma of lung, right ([name])     04/24/2016 PET scan    1. Adjacent hypermetabolic nodules in the RIGHT upper lobe consistent bronchogenic carcinoma. 2. Suspicion of ipsilateral hilar and paratracheal nodal metastasis. 3. Nodule in the LEFT upper lobe with suspicious morphology has a relatively low metabolic activity for size. Favor synchronous primary bronchogenic carcinoma. 4. No metabolic activity associated with a small LEFT lower lobe pulmonary nodule. Recommend attention on follow-up.      05/06/2016 Imaging    MRI brain- Negative for  metastatic disease.      05/28/2016 Procedure    1. Electromagnetic navigational bronchoscopy with brushings, needle     aspirations and biopsies of bilateral upper lobe nodules. 2. Endobronchial ultrasound with mediastinal and hilar lymph node     aspirations. By Dr. [name]      06/03/2016 Pathology Results    Diagnosis 1. Lung, biopsy, Right upper lobe - BLOOD AND BENIGN BRONCHIAL AND LUNG TISSUE. 2. Lung, biopsy, Left upper lobe - BLOOD AND BENIGN BRONCHIAL AND LUNG TISSUE.      06/03/2016 Pathology Results    Diagnosis TRANSBRONCHIAL NEEDLE ASPIRATION, NAVIGATION, RUL (SPECIMEN 5 OF 7, COLLECTED 05/28/16): MALIGNANT CELLS CONSISTENT WITH NON-SMALL CELL CARCINOMA Comment There are limited tumor cells in the cell block (insufficient for molecular testing). TTF-1 is positive in a few of the atypical cells. They appear negative for synaptophysin and cytokeratin 5/6. Overall there is limited tumor, but a lung adenocarcinoma is slightly favored. Dr. [name] has reviewed the case. The case was called to Dr. [name] on 06/03/2016.      06/13/2016 Procedure    Port placed by Dr. Arnoldo Morale      06/25/2016 -  Chemotherapy    The patient had palonosetron (ALOXI) injection 0.25 mg, 0.25 mg, Intravenous,  Once, 2 of 6 cycles  CARBOplatin (PARAPLATIN) 220 mg in sodium chloride 0.9 % 250 mL chemo infusion, 220 mg (100 % of original dose 215.8 mg), Intravenous,  Once, 2 of 6 cycles Dose modification:   (original dose 215.8 mg, Cycle 1),   (original dose 215.8 mg, Cycle 2)  PACLitaxel (TAXOL) 90 mg in dextrose 5 % 250 mL chemo infusion ( for chemotherapy treatment.        06/25/2016 -  Radiation Therapy  Radiation in [place], [place].  Last radiation is planned for 5/2      07/09/2016 - 07/10/2016 Hospital Admission    Admit date: 07/09/2016  Admission diagnosis: Fever and chills Additional comments: Negative work-up, discharged with course of Levaquin      07/09/2016 Treatment Plan Change     Treatment deferred x 1 week due to hospitalization        Thus far, he is tolerating therapy well.  He did have an episode which required a day in the hospital due to fevers and chills.  He reports a temperature of 100.45F in the hospital as his highest temperature.  He is worked up appropriately including a chest x-ray, urine testing, and blood cultures.  All are negative.  He was given IV antibiotics and discharged on Levaquin for 1 week.  He just finished Levaquin yesterday.    He denies any fever or chills since.  He is feeling well.  Appetite is fair.  Weight is down approximately 7 pounds since starting therapy.  We will continue to follow his weight and if this continues to be his trend, we will get a dietitian consult.  He reports minimal chest pain with swallowing.  He does have mouthwash with lidocaine which he notes is very effective.  He is also on a PPI.  He has baseline peripheral neuropathy secondary to diabetes.  He notes that this is stable.  Neuropathy is most significant in his feet.  He is provided education that paclitaxel chemotherapy can also result in peripheral neuropathy.  He is advised we do not want to make this any worse with chemotherapy.  If peripheral neuropathy does change his therapy to etoposide if needed.  Review of Systems  Constitutional: Negative.  Negative for chills, fever and weight loss.  HENT: Negative.   Eyes: Negative.   Respiratory: Negative.  Negative for cough.   Cardiovascular: Positive for chest pain (with swallowing only).  Gastrointestinal: Negative.  Negative for blood in stool, constipation, diarrhea, melena, nausea and vomiting.  Genitourinary: Negative.   Musculoskeletal: Negative.   Skin: Negative.   Neurological: Positive for sensory change (Peripheral neuropathy secondary to diabetes.). Negative for weakness.  Endo/Heme/Allergies: Negative.   Psychiatric/Behavioral: Negative.     Past Medical History:  Diagnosis Date  .  Adenocarcinoma of lung, right ([name]) 04/18/2016  . Anxiety   . Arthritis   . CHF (congestive heart failure) ([name])   . COPD (chronic obstructive pulmonary disease) ([place])   . Depression   . Diabetes mellitus without complication (HCC)    no meds  . Dyspnea   . History of kidney stones   . Hyperlipidemia   . Hypertension   . Macular degeneration   . Neuropathy ([place])   . Non-small cell lung cancer, right ([place]) 04/18/2016  . Prostatitis   . Pulmonary nodule, left 07/16/2016  . Sleep apnea    cpap    Past Surgical History:  Procedure Laterality Date  . CATARACT EXTRACTION, BILATERAL Bilateral   . NO PAST SURGERIES    . PORTACATH PLACEMENT Left 06/13/2016   Procedure: INSERTION PORT-A-CATH;  Surgeon: Aviva Signs, MD;  Location: [facility];  Service: General;  Laterality: Left;  Marland Kitchen VIDEO BRONCHOSCOPY WITH ENDOBRONCHIAL NAVIGATION N/A 05/28/2016   Procedure: VIDEO BRONCHOSCOPY WITH ENDOBRONCHIAL NAVIGATION;  Surgeon: [name], MD;  Location: [facility];  Service: Thoracic;  Laterality: N/A;  . VIDEO BRONCHOSCOPY WITH ENDOBRONCHIAL ULTRASOUND N/A 05/28/2016   Procedure: VIDEO BRONCHOSCOPY WITH ENDOBRONCHIAL ULTRASOUND;  Surgeon: [name],  MD;  Location: [facility];  Service: Thoracic;  Laterality: N/A;    Family History  Problem Relation Age of Onset  . Hypertension Mother   . Diabetes Father   . Heart disease Father   . Hypertension Sister     Social History   Social History  . Marital status: Single    Spouse name: N/A  . Number of children: N/A  . Years of education: N/A   Social History Main Topics  . Smoking status: Current Every Day Smoker    Packs/day: 0.50    Years: 55.00    Types: Cigarettes    Start date: 03/11/1961  . Smokeless tobacco: Never Used  . Alcohol use No  . Drug use: No  . Sexual activity: Yes    Birth control/ protection: None   Other Topics Concern  . Not on file   Social History Narrative  . No narrative on file     PHYSICAL  EXAMINATION  ECOG PERFORMANCE STATUS: 1 - Symptomatic but completely ambulatory  There were no vitals filed for this visit.  Vitals - 1 value per visit 0/01/9322  SYSTOLIC 557  DIASTOLIC 57  Pulse 99  Temperature 97.5  Respirations 18  Weight (lb) 183    GENERAL:alert, no distress, well nourished, well developed, comfortable, cooperative, obese, smiling and accompanied by his daughter, John [name], in chemo-recliner. SKIN: skin color, texture, turgor are normal, no rashes or significant lesions HEAD: Normocephalic, No masses, lesions, tenderness or abnormalities EYES: normal, EOMI, Conjunctiva are pink and non-injected EARS: External ears normal OROPHARYNX:lips, buccal mucosa, and tongue normal and mucous membranes are moist  NECK: supple, no adenopathy, trachea midline LYMPH:  no palpable lymphadenopathy BREAST:not examined LUNGS: clear to auscultation and percussion HEART: regular rate & rhythm, no murmurs and no gallops ABDOMEN:abdomen soft, obese and normal bowel sounds BACK: Back symmetric, no curvature. EXTREMITIES:less then 2 second capillary refill, no joint deformities, effusion, or inflammation, no skin discoloration, no cyanosis  NEURO: alert & oriented x 3 with fluent speech, no focal motor/sensory deficits, gait normal   LABORATORY DATA: CBC    Component Value Date/Time   WBC 4.4 07/16/2016 1027   RBC 3.68 (L) 07/16/2016 1027   HGB 12.0 (L) 07/16/2016 1027   HCT 34.5 (L) 07/16/2016 1027   HCT 36.9 (L) 07/14/2016 1442   PLT 296 07/16/2016 1027   PLT 266 07/14/2016 1442   MCV 93.8 07/16/2016 1027   MCV 92 07/14/2016 1442   MCH 32.6 07/16/2016 1027   MCHC 34.8 07/16/2016 1027   RDW 14.5 07/16/2016 1027   RDW 15.1 07/14/2016 1442   LYMPHSABS 0.5 (L) 07/16/2016 1027   LYMPHSABS 0.5 (L) 07/14/2016 1442   MONOABS 0.7 07/16/2016 1027   EOSABS 0.1 07/16/2016 1027   EOSABS 0.1 07/14/2016 1442   BASOSABS 0.0 07/16/2016 1027   BASOSABS 0.0 07/14/2016 1442       Chemistry      Component Value Date/Time   NA 136 07/16/2016 1027   NA 140 07/14/2016 1442   K 3.7 07/16/2016 1027   CL 101 07/16/2016 1027   CO2 28 07/16/2016 1027   BUN 18 07/16/2016 1027   BUN 18 07/14/2016 1442   CREATININE 0.77 07/16/2016 1027      Component Value Date/Time   CALCIUM 9.0 07/16/2016 1027   ALKPHOS 78 07/16/2016 1027   AST 15 07/16/2016 1027   ALT 20 07/16/2016 1027   BILITOT 0.5 07/16/2016 1027   BILITOT 0.3 02/01/2016 1007  PENDING LABS:   RADIOGRAPHIC STUDIES:  Dg Chest 2 View  Result Date: 07/08/2016 CLINICAL DATA:  Currently undergoing radiation and chemotherapy for lung cancer. Now febrile. EXAM: CHEST  2 VIEW COMPARISON:  06/13/2016 FINDINGS: There is a left-sided port terminating in the low SVC. The lungs are clear. There is no pleural effusion. The pulmonary vasculature is normal. Heart size is normal. IMPRESSION: No consolidation or effusion. Electronically Signed   By: Andreas Newport M.D.   On: 07/08/2016 22:53     PATHOLOGY:    ASSESSMENT AND PLAN:  Non-small cell lung cancer, right (HCC) NSCLC of right lung with hilar and paratracheal lymphadenopathy on PET imaging, favoring adenocarcinoma.  Complicated staging situation but in giving the benefit of the doubt, he is staged as a Stage IIB (T1bN1M0) with two adjacent RUL pulmonary nodules, RIGHT-sided lymphadenopathy as described AND a LUL pulmonary nodule measuring 11 mm with low metabolic activity biopsied but without any malignancy identified.  Currently undergoing concomitant chemoradiation with weekly Carboplatin/Paclitaxel beginning on 06/25/2016.  Oncology history updated.  Pre-treatment labs today: CBC diff, CMET, magnesium.  I personally reviewed and went over laboratory results with the patient.  The results are noted within this dictation.  Lab satisfy treatment parameters today.  Chart reviewed and recent hospitalization for fevers and chills noted from 07/09/2016-  07/10/2016.  Work-up was negative and he was discharged with a course of Levaquin.  Return next week for next cycle of chemotherapy.  Continue with XRT as directed.  Problem list reviewed with patient and edited accordingly.  Medications are reviewed with the patient and edited accordingly.  Return in 2 weeks for follow-up with treatment.  More than 50% of the time spent with the patient was utilized for counseling and coordination of care.  Pulmonary nodule, left LUL pulmonary nodule measuring 11 mm with low metabolic activity biopsied but without any malignancy identified.   This will require attention of follow-up imaging.  If concern for malignancy is identified, then he will be considered for localized treatment with SBRT per discussion at [facility] in March 2018.   ORDERS PLACED FOR THIS ENCOUNTER: No orders of the defined types were placed in this encounter.   MEDICATIONS PRESCRIBED THIS ENCOUNTER: No orders of the defined types were placed in this encounter.   THERAPY PLAN:  Concomitant chemoXRT with weekly Carboplatin/Paclitaxel +/- 2 additional cycles of Carboplatin/Paclitaxel full-dose every 21 days, followed by immunotherapy (Imfinzi) up to 52 weeks.  All questions were answered. The patient knows to call the clinic with any problems, questions or concerns. We can certainly see the patient much sooner if necessary.  Patient and plan discussed with Dr. Twana First and she is in agreement with the aforementioned.   This note is electronically signed by: Doy Mince 07/16/2016 4:34 PM

## 2016-07-16 NOTE — Assessment & Plan Note (Addendum)
LUL pulmonary nodule measuring 11 mm with low metabolic activity biopsied but without any malignancy identified.   This will require attention of follow-up imaging.  If concern for malignancy is identified, then he will be considered for localized treatment with SBRT per discussion at [facility] in March 2018.

## 2016-07-16 NOTE — Assessment & Plan Note (Addendum)
NSCLC of right lung with hilar and paratracheal lymphadenopathy on PET imaging, favoring adenocarcinoma.  Complicated staging situation but in giving the benefit of the doubt, he is staged as a Stage IIB (T1bN1M0) with two adjacent RUL pulmonary nodules, RIGHT-sided lymphadenopathy as described AND a LUL pulmonary nodule measuring 11 mm with low metabolic activity biopsied but without any malignancy identified.  Currently undergoing concomitant chemoradiation with weekly Carboplatin/Paclitaxel beginning on 06/25/2016.  Oncology history updated.  Pre-treatment labs today: CBC diff, CMET, magnesium.  I personally reviewed and went over laboratory results with the patient.  The results are noted within this dictation.  Lab satisfy treatment parameters today.  Chart reviewed and recent hospitalization for fevers and chills noted from 07/09/2016- 07/10/2016.  Work-up was negative and he was discharged with a course of Levaquin.  Return next week for next cycle of chemotherapy.  Continue with XRT as directed.  Problem list reviewed with patient and edited accordingly.  Medications are reviewed with the patient and edited accordingly.  Return in 2 weeks for follow-up with treatment.  More than 50% of the time spent with the patient was utilized for counseling and coordination of care.

## 2016-07-16 NOTE — Patient Instructions (Signed)
[place] at [facility] Discharge Instructions  RECOMMENDATIONS MADE BY THE CONSULTANT AND ANY TEST RESULTS WILL BE SENT TO YOUR REFERRING PHYSICIAN.  You saw Kirby Crigler, PA-C, today. Treatment today as long as labs are okay. Treatment next week. Continue radiation treatments. Treatment in 2 weeks with follow up. See Amy at checkout for appointments.  Thank you for choosing [place] at [facility] to provide your oncology and hematology care.  To afford each patient quality time with our provider, please arrive at least 15 minutes before your scheduled appointment time.    If you have a lab appointment with the [facility] please come in thru the  Main Entrance and check in at the main information desk  You need to re-schedule your appointment should you arrive 10 or more minutes late.  We strive to give you quality time with our providers, and arriving late affects you and other patients whose appointments are after yours.  Also, if you no show three or more times for appointments you may be dismissed from the clinic at the providers discretion.     Again, thank you for choosing [facility].  Our hope is that these requests will decrease the amount of time that you wait before being seen by our physicians.       _____________________________________________________________  Should you have questions after your visit to [facility], please contact our office at [phone] between the hours of 8:30 a.m. and 4:30 p.m.  Voicemails left after 4:30 p.m. will not be returned until the following business day.  For prescription refill requests, have your pharmacy contact our office.       Resources For Cancer Patients and their Caregivers ? American Cancer Society: Can assist with transportation, wigs, general needs, runs Look Good Feel Better.        (260)373-1136 ? Cancer Care: Provides financial assistance,  online support groups, medication/co-pay assistance.  1-800-813-HOPE 7794215217) ? [place] Assists [place] cancer patients and their families through emotional , educational and financial support.  202 604 2901 ? [place] DSS Where to apply for food stamps, Medicaid and utility assistance. 731-591-6736 ? RCATS: Transportation to medical appointments. (680)879-4560 ? Social Security Administration: May apply for disability if have a Stage IV cancer. (240)207-9348 (431) 534-9787 ? [employer], Disability and Transit Services: Assists with nutrition, care and transit needs. [place] Support Programs: '@10RELATIVEDAYS'$ @ > Cancer Support Group  2nd Tuesday of the month 1pm-2pm, Journey Room  > Creative Journey  3rd Tuesday of the month 1130am-1pm, Journey Room  > Look Good Feel Better  1st Wednesday of the month 10am-12 noon, Journey Room (Call [facility] to register [phone])

## 2016-07-17 DIAGNOSIS — Z510 Encounter for antineoplastic radiation therapy: Secondary | ICD-10-CM | POA: Diagnosis not present

## 2016-07-17 DIAGNOSIS — R1310 Dysphagia, unspecified: Secondary | ICD-10-CM | POA: Diagnosis not present

## 2016-07-17 DIAGNOSIS — C3401 Malignant neoplasm of right main bronchus: Secondary | ICD-10-CM | POA: Diagnosis not present

## 2016-07-18 DIAGNOSIS — Z510 Encounter for antineoplastic radiation therapy: Secondary | ICD-10-CM | POA: Diagnosis not present

## 2016-07-18 DIAGNOSIS — R1310 Dysphagia, unspecified: Secondary | ICD-10-CM | POA: Diagnosis not present

## 2016-07-18 DIAGNOSIS — C3401 Malignant neoplasm of right main bronchus: Secondary | ICD-10-CM | POA: Diagnosis not present

## 2016-07-21 DIAGNOSIS — Z510 Encounter for antineoplastic radiation therapy: Secondary | ICD-10-CM | POA: Diagnosis not present

## 2016-07-21 DIAGNOSIS — C3401 Malignant neoplasm of right main bronchus: Secondary | ICD-10-CM | POA: Diagnosis not present

## 2016-07-21 DIAGNOSIS — R1310 Dysphagia, unspecified: Secondary | ICD-10-CM | POA: Diagnosis not present

## 2016-07-22 DIAGNOSIS — Z510 Encounter for antineoplastic radiation therapy: Secondary | ICD-10-CM | POA: Diagnosis not present

## 2016-07-22 DIAGNOSIS — C3401 Malignant neoplasm of right main bronchus: Secondary | ICD-10-CM | POA: Diagnosis not present

## 2016-07-22 DIAGNOSIS — R1310 Dysphagia, unspecified: Secondary | ICD-10-CM | POA: Diagnosis not present

## 2016-07-23 ENCOUNTER — Other Ambulatory Visit (HOSPITAL_COMMUNITY): Payer: Self-pay | Admitting: Oncology

## 2016-07-23 ENCOUNTER — Encounter (HOSPITAL_BASED_OUTPATIENT_CLINIC_OR_DEPARTMENT_OTHER): Payer: Medicare Other

## 2016-07-23 VITALS — BP 184/84 | HR 111 | Temp 97.8°F | Resp 16 | Wt 179.4 lb

## 2016-07-23 DIAGNOSIS — R1310 Dysphagia, unspecified: Secondary | ICD-10-CM | POA: Diagnosis not present

## 2016-07-23 DIAGNOSIS — C3491 Malignant neoplasm of unspecified part of right bronchus or lung: Secondary | ICD-10-CM

## 2016-07-23 DIAGNOSIS — C3400 Malignant neoplasm of unspecified main bronchus: Secondary | ICD-10-CM | POA: Diagnosis not present

## 2016-07-23 DIAGNOSIS — R739 Hyperglycemia, unspecified: Secondary | ICD-10-CM | POA: Diagnosis not present

## 2016-07-23 DIAGNOSIS — R0602 Shortness of breath: Secondary | ICD-10-CM | POA: Diagnosis not present

## 2016-07-23 DIAGNOSIS — Z510 Encounter for antineoplastic radiation therapy: Secondary | ICD-10-CM | POA: Diagnosis not present

## 2016-07-23 DIAGNOSIS — Z5111 Encounter for antineoplastic chemotherapy: Secondary | ICD-10-CM | POA: Diagnosis not present

## 2016-07-23 DIAGNOSIS — R300 Dysuria: Secondary | ICD-10-CM | POA: Diagnosis not present

## 2016-07-23 DIAGNOSIS — C3401 Malignant neoplasm of right main bronchus: Secondary | ICD-10-CM | POA: Diagnosis not present

## 2016-07-23 LAB — CBC WITH DIFFERENTIAL/PLATELET
Basophils Absolute: 0 10*3/uL (ref 0.0–0.1)
Basophils Relative: 0 %
EOS PCT: 1 %
Eosinophils Absolute: 0.1 10*3/uL (ref 0.0–0.7)
HCT: 34.5 % — ABNORMAL LOW (ref 39.0–52.0)
Hemoglobin: 12 g/dL — ABNORMAL LOW (ref 13.0–17.0)
LYMPHS ABS: 0.5 10*3/uL — AB (ref 0.7–4.0)
LYMPHS PCT: 10 %
MCH: 32.5 pg (ref 26.0–34.0)
MCHC: 34.8 g/dL (ref 30.0–36.0)
MCV: 93.5 fL (ref 78.0–100.0)
MONO ABS: 0.4 10*3/uL (ref 0.1–1.0)
Monocytes Relative: 7 %
Neutro Abs: 4.2 10*3/uL (ref 1.7–7.7)
Neutrophils Relative %: 82 %
PLATELETS: 285 10*3/uL (ref 150–400)
RBC: 3.69 MIL/uL — ABNORMAL LOW (ref 4.22–5.81)
RDW: 14.8 % (ref 11.5–15.5)
WBC: 5.2 10*3/uL (ref 4.0–10.5)

## 2016-07-23 LAB — COMPREHENSIVE METABOLIC PANEL
ALT: 19 U/L (ref 17–63)
AST: 19 U/L (ref 15–41)
Albumin: 3.2 g/dL — ABNORMAL LOW (ref 3.5–5.0)
Alkaline Phosphatase: 79 U/L (ref 38–126)
Anion gap: 7 (ref 5–15)
BUN: 16 mg/dL (ref 6–20)
CHLORIDE: 98 mmol/L — AB (ref 101–111)
CO2: 30 mmol/L (ref 22–32)
CREATININE: 0.79 mg/dL (ref 0.61–1.24)
Calcium: 8.9 mg/dL (ref 8.9–10.3)
Glucose, Bld: 155 mg/dL — ABNORMAL HIGH (ref 65–99)
Potassium: 3.8 mmol/L (ref 3.5–5.1)
Sodium: 135 mmol/L (ref 135–145)
TOTAL PROTEIN: 6.5 g/dL (ref 6.5–8.1)
Total Bilirubin: 0.3 mg/dL (ref 0.3–1.2)

## 2016-07-23 LAB — MAGNESIUM: MAGNESIUM: 1.8 mg/dL (ref 1.7–2.4)

## 2016-07-23 MED ORDER — DIPHENHYDRAMINE HCL 50 MG/ML IJ SOLN
25.0000 mg | Freq: Once | INTRAMUSCULAR | Status: AC
  Administered 2016-07-23: 25 mg via INTRAVENOUS
  Filled 2016-07-23: qty 1

## 2016-07-23 MED ORDER — FAMOTIDINE IN NACL 20-0.9 MG/50ML-% IV SOLN
20.0000 mg | Freq: Once | INTRAVENOUS | Status: AC
  Administered 2016-07-23: 20 mg via INTRAVENOUS
  Filled 2016-07-23: qty 50

## 2016-07-23 MED ORDER — HYDROCODONE-ACETAMINOPHEN 5-325 MG PO TABS: 0.5000 | ORAL_TABLET | Freq: Four times a day (QID) | ORAL | 0 refills | Status: DC | PRN

## 2016-07-23 MED ORDER — MORPHINE SULFATE (PF) 2 MG/ML IV SOLN
1.0000 mg | INTRAVENOUS | Status: AC
  Administered 2016-07-23: 1 mg via INTRAVENOUS

## 2016-07-23 MED ORDER — SODIUM CHLORIDE 0.9 % IV SOLN
20.0000 mg | Freq: Once | INTRAVENOUS | Status: AC
  Administered 2016-07-23: 20 mg via INTRAVENOUS
  Filled 2016-07-23: qty 2

## 2016-07-23 MED ORDER — HEPARIN SOD (PORK) LOCK FLUSH 100 UNIT/ML IV SOLN
500.0000 [IU] | Freq: Once | INTRAVENOUS | Status: AC | PRN
  Administered 2016-07-23: 500 [IU]
  Filled 2016-07-23 (×2): qty 5

## 2016-07-23 MED ORDER — PALONOSETRON HCL INJECTION 0.25 MG/5ML
0.2500 mg | Freq: Once | INTRAVENOUS | Status: AC
  Administered 2016-07-23: 0.25 mg via INTRAVENOUS
  Filled 2016-07-23: qty 5

## 2016-07-23 MED ORDER — MORPHINE SULFATE (PF) 2 MG/ML IV SOLN
INTRAVENOUS | Status: AC
  Filled 2016-07-23: qty 1

## 2016-07-23 MED ORDER — SODIUM CHLORIDE 0.9 % IV SOLN
Freq: Once | INTRAVENOUS | Status: AC
  Administered 2016-07-23: 12:00:00 via INTRAVENOUS

## 2016-07-23 MED ORDER — SODIUM CHLORIDE 0.9 % IV SOLN
215.8000 mg | Freq: Once | INTRAVENOUS | Status: AC
  Administered 2016-07-23: 220 mg via INTRAVENOUS
  Filled 2016-07-23: qty 22

## 2016-07-23 MED ORDER — PACLITAXEL CHEMO INJECTION 300 MG/50ML
45.0000 mg/m2 | Freq: Once | INTRAVENOUS | Status: AC
  Administered 2016-07-23: 90 mg via INTRAVENOUS
  Filled 2016-07-23: qty 15

## 2016-07-23 NOTE — Progress Notes (Signed)
Blood pressure elevated at end of therapy. Robynn Pane PA-C notified, Patient is on blood pressure medicine. Patient is asymptomatic. Patient discharged ambulatory from clinic with wife at side.follow up as scheduled.

## 2016-07-23 NOTE — Patient Instructions (Signed)
[facility] Discharge Instructions for Patients Receiving Chemotherapy   Beginning January 23rd 2017 lab work for the [facility] will be done in the  Main lab at [facility] on 1st floor. If you have a lab appointment with the [facility] please come in thru the  Main Entrance and check in at the main information desk   Today you received the following chemotherapy agents carbo and taxol.  To help prevent nausea and vomiting after your treatment, we encourage you to take your nausea medication as instructed.  If you develop nausea and vomiting, or diarrhea that is not controlled by your medication, call the clinic.  The clinic phone number is [phone]. Office hours are Monday-Friday 8:30am-5:00pm.  BELOW ARE SYMPTOMS THAT SHOULD BE REPORTED IMMEDIATELY:  *FEVER GREATER THAN 101.0 F  *CHILLS WITH OR WITHOUT FEVER  NAUSEA AND VOMITING THAT IS NOT CONTROLLED WITH YOUR NAUSEA MEDICATION  *UNUSUAL SHORTNESS OF BREATH  *UNUSUAL BRUISING OR BLEEDING  TENDERNESS IN MOUTH AND THROAT WITH OR WITHOUT PRESENCE OF ULCERS  *URINARY PROBLEMS  *BOWEL PROBLEMS  UNUSUAL RASH Items with * indicate a potential emergency and should be followed up as soon as possible. If you have an emergency after office hours please contact your primary care physician or go to the nearest emergency department.  Please call the clinic during office hours if you have any questions or concerns.   You may also contact the Patient Navigator at [phone] should you have any questions or need assistance in obtaining follow up care.      Resources For Cancer Patients and their Caregivers ? American Cancer Society: Can assist with transportation, wigs, general needs, runs Look Good Feel Better.        (838)603-1987 ? Cancer Care: Provides financial assistance, online support groups, medication/co-pay assistance.  1-800-813-HOPE 918-555-1617) ? [place] Assists [place] cancer patients and their families through emotional , educational and financial support.  724-785-0686 ? [place] DSS Where to apply for food stamps, Medicaid and utility assistance. (507) 079-3379 ? RCATS: Transportation to medical appointments. 2252086561 ? Social Security Administration: May apply for disability if have a Stage IV cancer. 437-583-8315 (667) 263-8252 ? [employer], Disability and Transit Services: Assists with nutrition, care and transit needs. 732-821-1607

## 2016-07-23 NOTE — Progress Notes (Signed)
Patient reports no change in neuropathy in feet, feels his toe burn intermittently. Reports he feels numbness, tingling and burning intermittently in fingertips both hands, but it does not interfere with fine motor movements. Above discussed with T.Kefalas,PA. No changes needed to current chemo plan, ok to give chemo as ordered.  Reported burning pain in chest from radiation. States he has taken 1 hydrocodone today. Order for morphine 1 mg IV push, given as ordered. Patient felt little to no relief from morphine. States he knows its from radiation and when he gets home and lays down it will ease up then. Did not request anything else for pain while here.  Tolerated chemo well. Stable and ambulatory on discharge home with wife.

## 2016-07-24 DIAGNOSIS — Z510 Encounter for antineoplastic radiation therapy: Secondary | ICD-10-CM | POA: Diagnosis not present

## 2016-07-24 DIAGNOSIS — C3401 Malignant neoplasm of right main bronchus: Secondary | ICD-10-CM | POA: Diagnosis not present

## 2016-07-24 DIAGNOSIS — R1310 Dysphagia, unspecified: Secondary | ICD-10-CM | POA: Diagnosis not present

## 2016-07-25 DIAGNOSIS — Z510 Encounter for antineoplastic radiation therapy: Secondary | ICD-10-CM | POA: Diagnosis not present

## 2016-07-25 DIAGNOSIS — R1310 Dysphagia, unspecified: Secondary | ICD-10-CM | POA: Diagnosis not present

## 2016-07-25 DIAGNOSIS — C3401 Malignant neoplasm of right main bronchus: Secondary | ICD-10-CM | POA: Diagnosis not present

## 2016-07-28 ENCOUNTER — Other Ambulatory Visit (HOSPITAL_COMMUNITY): Payer: Self-pay | Admitting: Oncology

## 2016-07-28 ENCOUNTER — Telehealth (HOSPITAL_COMMUNITY): Payer: Self-pay | Admitting: Oncology

## 2016-07-28 DIAGNOSIS — Z510 Encounter for antineoplastic radiation therapy: Secondary | ICD-10-CM | POA: Diagnosis not present

## 2016-07-28 DIAGNOSIS — C3491 Malignant neoplasm of unspecified part of right bronchus or lung: Secondary | ICD-10-CM

## 2016-07-28 DIAGNOSIS — R1310 Dysphagia, unspecified: Secondary | ICD-10-CM | POA: Diagnosis not present

## 2016-07-28 DIAGNOSIS — C3401 Malignant neoplasm of right main bronchus: Secondary | ICD-10-CM | POA: Diagnosis not present

## 2016-07-28 MED ORDER — FENTANYL 12 MCG/HR TD PT72: 12.5000 ug | MEDICATED_PATCH | TRANSDERMAL | 0 refills | Status: DC

## 2016-07-28 NOTE — Telephone Encounter (Signed)
Dr. Raliegh Ip from [facility], [place]) called to update [place] on pt.  Patient continues to have radiation induced esophagitis.  He is using Hydrocodone, but does not like the way it makes him feel.  He reports increased drowsiness.  Therefore, we will start Fentanyl.  Given his poor tolerance to Hydrocodone, we will start low with Fentanyl and increase rapidly based upon effectiveness and tolerance.   Rx is printed for Fentanyl 12.5 mcg.  Will increase to 25 mcg if pain is not improved by time of follow-up appointment in 2 days.  KEFALAS,THOMAS, PA-C 07/28/2016 1:17 PM

## 2016-07-29 DIAGNOSIS — C3401 Malignant neoplasm of right main bronchus: Secondary | ICD-10-CM | POA: Diagnosis not present

## 2016-07-29 DIAGNOSIS — R1310 Dysphagia, unspecified: Secondary | ICD-10-CM | POA: Diagnosis not present

## 2016-07-29 DIAGNOSIS — Z510 Encounter for antineoplastic radiation therapy: Secondary | ICD-10-CM | POA: Diagnosis not present

## 2016-07-30 ENCOUNTER — Encounter (HOSPITAL_COMMUNITY): Payer: Self-pay | Admitting: Emergency Medicine

## 2016-07-30 ENCOUNTER — Encounter (HOSPITAL_COMMUNITY): Payer: Self-pay

## 2016-07-30 ENCOUNTER — Encounter (HOSPITAL_BASED_OUTPATIENT_CLINIC_OR_DEPARTMENT_OTHER): Payer: Medicare Other

## 2016-07-30 ENCOUNTER — Encounter (HOSPITAL_BASED_OUTPATIENT_CLINIC_OR_DEPARTMENT_OTHER): Payer: Medicare Other | Admitting: Oncology

## 2016-07-30 VITALS — BP 127/76 | HR 101 | Temp 98.1°F | Resp 18

## 2016-07-30 VITALS — BP 112/82 | HR 112 | Temp 98.0°F | Resp 18 | Wt 178.5 lb

## 2016-07-30 DIAGNOSIS — C3491 Malignant neoplasm of unspecified part of right bronchus or lung: Secondary | ICD-10-CM

## 2016-07-30 DIAGNOSIS — C3411 Malignant neoplasm of upper lobe, right bronchus or lung: Secondary | ICD-10-CM

## 2016-07-30 DIAGNOSIS — R0602 Shortness of breath: Secondary | ICD-10-CM | POA: Diagnosis not present

## 2016-07-30 DIAGNOSIS — Z5111 Encounter for antineoplastic chemotherapy: Secondary | ICD-10-CM | POA: Diagnosis present

## 2016-07-30 DIAGNOSIS — K1230 Oral mucositis (ulcerative), unspecified: Secondary | ICD-10-CM | POA: Diagnosis not present

## 2016-07-30 DIAGNOSIS — C3401 Malignant neoplasm of right main bronchus: Secondary | ICD-10-CM | POA: Diagnosis not present

## 2016-07-30 DIAGNOSIS — R739 Hyperglycemia, unspecified: Secondary | ICD-10-CM | POA: Diagnosis not present

## 2016-07-30 DIAGNOSIS — R300 Dysuria: Secondary | ICD-10-CM | POA: Diagnosis not present

## 2016-07-30 DIAGNOSIS — Z510 Encounter for antineoplastic radiation therapy: Secondary | ICD-10-CM | POA: Diagnosis not present

## 2016-07-30 DIAGNOSIS — R1310 Dysphagia, unspecified: Secondary | ICD-10-CM | POA: Diagnosis not present

## 2016-07-30 DIAGNOSIS — C3400 Malignant neoplasm of unspecified main bronchus: Secondary | ICD-10-CM | POA: Diagnosis not present

## 2016-07-30 LAB — COMPREHENSIVE METABOLIC PANEL
ALT: 21 U/L (ref 17–63)
AST: 20 U/L (ref 15–41)
Albumin: 3.3 g/dL — ABNORMAL LOW (ref 3.5–5.0)
Alkaline Phosphatase: 78 U/L (ref 38–126)
Anion gap: 8 (ref 5–15)
BUN: 17 mg/dL (ref 6–20)
CHLORIDE: 97 mmol/L — AB (ref 101–111)
CO2: 26 mmol/L (ref 22–32)
CREATININE: 0.82 mg/dL (ref 0.61–1.24)
Calcium: 8.8 mg/dL — ABNORMAL LOW (ref 8.9–10.3)
Glucose, Bld: 220 mg/dL — ABNORMAL HIGH (ref 65–99)
POTASSIUM: 3.5 mmol/L (ref 3.5–5.1)
Sodium: 131 mmol/L — ABNORMAL LOW (ref 135–145)
Total Bilirubin: 0.6 mg/dL (ref 0.3–1.2)
Total Protein: 6.6 g/dL (ref 6.5–8.1)

## 2016-07-30 LAB — CBC WITH DIFFERENTIAL/PLATELET
BASOS ABS: 0 10*3/uL (ref 0.0–0.1)
Basophils Relative: 0 %
Eosinophils Absolute: 0 10*3/uL (ref 0.0–0.7)
Eosinophils Relative: 1 %
HEMATOCRIT: 32.1 % — AB (ref 39.0–52.0)
HEMOGLOBIN: 11.4 g/dL — AB (ref 13.0–17.0)
Lymphocytes Relative: 7 %
Lymphs Abs: 0.3 10*3/uL — ABNORMAL LOW (ref 0.7–4.0)
MCH: 33.3 pg (ref 26.0–34.0)
MCHC: 35.5 g/dL (ref 30.0–36.0)
MCV: 93.9 fL (ref 78.0–100.0)
MONO ABS: 0.3 10*3/uL (ref 0.1–1.0)
MONOS PCT: 6 %
NEUTROS ABS: 4 10*3/uL (ref 1.7–7.7)
Neutrophils Relative %: 86 %
Platelets: 230 10*3/uL (ref 150–400)
RBC: 3.42 MIL/uL — ABNORMAL LOW (ref 4.22–5.81)
RDW: 15 % (ref 11.5–15.5)
WBC: 4.7 10*3/uL (ref 4.0–10.5)

## 2016-07-30 LAB — MAGNESIUM: MAGNESIUM: 1.8 mg/dL (ref 1.7–2.4)

## 2016-07-30 MED ORDER — SODIUM CHLORIDE 0.9 % IV SOLN
Freq: Once | INTRAVENOUS | Status: AC
  Administered 2016-07-30: 12:00:00 via INTRAVENOUS

## 2016-07-30 MED ORDER — DIPHENHYDRAMINE HCL 50 MG/ML IJ SOLN
25.0000 mg | Freq: Once | INTRAMUSCULAR | Status: AC
  Administered 2016-07-30: 25 mg via INTRAVENOUS
  Filled 2016-07-30: qty 1

## 2016-07-30 MED ORDER — FAMOTIDINE IN NACL 20-0.9 MG/50ML-% IV SOLN
20.0000 mg | Freq: Once | INTRAVENOUS | Status: AC
  Administered 2016-07-30: 20 mg via INTRAVENOUS
  Filled 2016-07-30: qty 50

## 2016-07-30 MED ORDER — SODIUM CHLORIDE 0.9 % IV SOLN
215.8000 mg | Freq: Once | INTRAVENOUS | Status: AC
  Administered 2016-07-30: 220 mg via INTRAVENOUS
  Filled 2016-07-30: qty 22

## 2016-07-30 MED ORDER — DEXTROSE 5 % IV SOLN
45.0000 mg/m2 | Freq: Once | INTRAVENOUS | Status: AC
  Administered 2016-07-30: 90 mg via INTRAVENOUS
  Filled 2016-07-30: qty 15

## 2016-07-30 MED ORDER — SUCRALFATE 1 G PO TABS: 1.0000 g | ORAL_TABLET | Freq: Three times a day (TID) | ORAL | 2 refills | Status: DC

## 2016-07-30 MED ORDER — SODIUM CHLORIDE 0.9 % IV SOLN
20.0000 mg | Freq: Once | INTRAVENOUS | Status: AC
  Administered 2016-07-30: 20 mg via INTRAVENOUS
  Filled 2016-07-30: qty 2

## 2016-07-30 MED ORDER — HEPARIN SOD (PORK) LOCK FLUSH 100 UNIT/ML IV SOLN
500.0000 [IU] | Freq: Once | INTRAVENOUS | Status: AC | PRN
  Administered 2016-07-30: 500 [IU]
  Filled 2016-07-30: qty 5

## 2016-07-30 MED ORDER — PALONOSETRON HCL INJECTION 0.25 MG/5ML
0.2500 mg | Freq: Once | INTRAVENOUS | Status: AC
  Administered 2016-07-30: 0.25 mg via INTRAVENOUS
  Filled 2016-07-30: qty 5

## 2016-07-30 NOTE — Progress Notes (Signed)
Went to see how pts pain was doing while he was here for treatment.  He said he wasn't really having any pain.  It still was hurting when he ate though.  He said that they called from [place] and said that he would have to pick his prescription up of "the pill you mix in water" im assuming he was talking about carafate.  We have not written him for that prescription and I had called [facility] and there had not been a prescription for carafate called in recently.  Spoke with Kirby Crigler PA we called pt in carafate.  I explained to pt this would help coat his throat and stomach.  He will take this 3 times a day with meals and before bedtime.  He verbalized understanding.

## 2016-07-30 NOTE — Progress Notes (Signed)
Tolerated chemo well. Stable and ambulatory on discharge home with daughter.

## 2016-07-30 NOTE — Addendum Note (Signed)
Addended by: Elenor Legato on: 07/30/2016 02:19 PM   Modules accepted: Orders

## 2016-07-30 NOTE — Progress Notes (Signed)
[facility] Hematology/Oncology Progress Note  Name: John [name]      MRN: [id number]   Date: 07/30/2016 Time:10:38 AM   REFERRING PHYSICIAN:  [name], MD (Primary Care Provider)  REASON FOR CONSULT:  Pulmonary lesion(s)   DIAGNOSIS:  Multifocal RUL pulmonary lesions, 1.7 cm and 2.0 cm with RIGHT hilar and paratracheal lymphadenopathy with a LUL pulmonary lesion with low hypermetabolic activity measuring 1.1 cm.  HISTORY OF PRESENT ILLNESS:   John [name] is a 71 y.o. male returns for follow up of pulmonary masses.  He is doing well today. He says for the past 2-3 weeks his tongue has been sore and he has had trouble swallowing. No mouth sores. He has been using magic mouthwash, which has been helping. He has had a hard time swallowing solids, so he has been trying to drink a lot of protein shakes. His vision has been blurred in his left eye. It has also been red, but he believes this is from pollen allergies. He has been breathing better, but doesn't have any energy. He has not been sleeping well, without much relief with sleep medication. Denies chest pain, SOB, abdominal pain, diarrhea, or any other concerns.   Review of Systems  Constitutional: Positive for malaise/fatigue.  HENT:       Tongue soreness Dysphagia   Eyes: Positive for blurred vision (left) and redness (left).  Respiratory: Negative.  Negative for shortness of breath.   Cardiovascular: Negative.  Negative for chest pain.  Gastrointestinal: Negative.  Negative for abdominal pain and diarrhea.  Genitourinary: Negative.   Musculoskeletal: Negative.   Skin: Negative.   Neurological: Negative.   Endo/Heme/Allergies: Negative.   Psychiatric/Behavioral: The patient has insomnia.   All other systems reviewed and are negative. 14 point review of systems was performed and is negative except as detailed under history of present illness and above  PAST MEDICAL HISTORY:   Past Medical History:   Diagnosis Date  . Adenocarcinoma of lung, right ([place]) 04/18/2016  . Anxiety   . Arthritis   . CHF (congestive heart failure) ([place])   . COPD (chronic obstructive pulmonary disease) ([place])   . Depression   . Diabetes mellitus without complication (HCC)    no meds  . Dyspnea   . History of kidney stones   . Hyperlipidemia   . Hypertension   . Macular degeneration   . Neuropathy   . Non-small cell lung cancer, right ([place]) 04/18/2016  . Prostatitis   . Pulmonary nodule, left 07/16/2016  . Sleep apnea    cpap    ALLERGIES: No Known Allergies    MEDICATIONS: I have reviewed the patient's current medications.    Current Outpatient Prescriptions on File Prior to Visit  Medication Sig Dispense Refill  . albuterol (PROVENTIL HFA;VENTOLIN HFA) 108 (90 Base) MCG/ACT inhaler Inhale 2 puffs into the lungs every 6 (six) hours as needed for wheezing or shortness of breath. 1 Inhaler 2  . aspirin EC 81 MG tablet Take 81 mg by mouth daily.    . blood glucose meter kit and supplies KIT Dispense based on patient and insurance preference. Use up to 2 times daily as directed. (FOR ICD-9 250.00, 250.01). 1 each 0  . cloNIDine (CATAPRES) 0.1 MG tablet Take 1 tablet (0.1 mg total) by mouth 3 (three) times daily. 90 tablet 3  . dexamethasone (DECADRON) 4 MG tablet Take 2 tablets (8 mg total) by mouth daily. Start the day after chemotherapy for 2  days. 30 tablet 1  . fentaNYL (DURAGESIC - DOSED MCG/HR) 12 MCG/HR Place 1 patch (12.5 mcg total) onto the skin every 3 (three) days. 10 patch 0  . finasteride (PROSCAR) 5 MG tablet TAKE 1 TABLET EVERY DAY 30 tablet 11  . fluticasone (FLONASE) 50 MCG/ACT nasal spray Place 2 sprays into both nostrils daily. (Patient taking differently: Place 2 sprays into both nostrils at bedtime as needed for allergies. ) 16 g 6  . gabapentin (NEURONTIN) 300 MG capsule Take 300-600 mg by mouth 2 (two) times daily. Pt takes one capsule in the morning and two at bedtime.    Marland Kitchen  glipiZIDE (GLUCOTROL) 5 MG tablet Take 1 tablet (5 mg total) by mouth daily before breakfast. 30 tablet 1  . HYDROcodone-acetaminophen (NORCO) 5-325 MG tablet Take 0.5-1 tablets by mouth every 6 (six) hours as needed for moderate pain. 30 tablet 0  . INCRUSE ELLIPTA 62.5 MCG/INH AEPB INHALE 1 PUFF INTO THE LUNGS DAILY. 30 each 2  . irbesartan-hydrochlorothiazide (AVALIDE) 300-12.5 MG tablet Take 1 tablet by mouth daily. 90 tablet 1  . levofloxacin (LEVAQUIN) 500 MG tablet Take 1 tablet (500 mg total) by mouth daily. 7 tablet 0  . lidocaine-prilocaine (EMLA) cream Apply to affected area once 30 g 3  . linagliptin (TRADJENTA) 5 MG TABS tablet Take 1 tablet (5 mg total) by mouth daily. 30 tablet 1  . LORazepam (ATIVAN) 0.5 MG tablet TAKE 1 TABLET BY MOUTH TWICE A DAY AS NEEDED 30 tablet 1  . magic mouthwash w/lidocaine SOLN USE 1 TEASPOONFUL BY 15MINS BEFORE MEALS AND AS NEEDED FOR DISCOMFORT,SWISH AND SWALLOW  1  . meloxicam (MOBIC) 7.5 MG tablet TAKE 1 TABLET EVERY DAY FOR BACK PAIN (Patient taking differently: TAKE 1 TABLET DAILY AS NEEDED FOR BACK PAIN) 90 tablet 0  . metoprolol succinate (TOPROL-XL) 50 MG 24 hr tablet Take 1 tablet (50 mg total) by mouth daily. Take with or immediately following a meal. (Patient taking differently: Take 50 mg by mouth at bedtime. Take with or immediately following a meal.) 90 tablet 3  . nystatin (MYCOSTATIN) 100000 UNIT/ML suspension     . omeprazole (PRILOSEC) 20 MG capsule Take 20 mg by mouth daily.     . ondansetron (ZOFRAN) 8 MG tablet Take 1 tablet (8 mg total) by mouth 2 (two) times daily as needed for refractory nausea / vomiting. Start on day 3 after chemo. 30 tablet 1  . pravastatin (PRAVACHOL) 40 MG tablet Take 40 mg by mouth every 3 (three) days. M-W-F    . prochlorperazine (COMPAZINE) 10 MG tablet Take 1 tablet (10 mg total) by mouth every 6 (six) hours as needed (Nausea or vomiting). 30 tablet 1  . tamsulosin (FLOMAX) 0.4 MG CAPS capsule Take 0.4 mg  by mouth daily after breakfast.    . traZODone (DESYREL) 50 MG tablet Take 1 tablet (50 mg total) by mouth at bedtime as needed for sleep. 30 tablet 0   No current facility-administered medications on file prior to visit.      PAST SURGICAL HISTORY Past Surgical History:  Procedure Laterality Date  . CATARACT EXTRACTION, BILATERAL Bilateral   . NO PAST SURGERIES    . PORTACATH PLACEMENT Left 06/13/2016   Procedure: INSERTION PORT-A-CATH;  Surgeon: Aviva Signs, MD;  Location: [facility];  Service: General;  Laterality: Left;  Marland Kitchen VIDEO BRONCHOSCOPY WITH ENDOBRONCHIAL NAVIGATION N/A 05/28/2016   Procedure: VIDEO BRONCHOSCOPY WITH ENDOBRONCHIAL NAVIGATION;  Surgeon: [name], MD;  Location: [facility];  Service: Thoracic;  Laterality: N/A;  . VIDEO BRONCHOSCOPY WITH ENDOBRONCHIAL ULTRASOUND N/A 05/28/2016   Procedure: VIDEO BRONCHOSCOPY WITH ENDOBRONCHIAL ULTRASOUND;  Surgeon: [name], MD;  Location: [facility];  Service: Thoracic;  Laterality: N/A;    FAMILY HISTORY: Family History  Problem Relation Age of Onset  . Hypertension Mother   . Diabetes Father   . Heart disease Father   . Hypertension Sister    Mother deceased at age 54 from renal failure Father deceased at age 10 from stroke. He has 5 children:  67 yo son  35 yo daughter  75 yo daughter  3 yo daughter  50 yo son He denies any cancer in his family  SOCIAL HISTORY:  reports that he has been smoking Cigarettes.  He started smoking about 55 years ago. He has a 27.50 pack-year smoking history. He has never used smokeless tobacco. He reports that he does not drink alcohol or use drugs.  He recently cut his smoking down to 0.5 ppd.  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  Social History   Social History  . Marital status: Single    Spouse name: N/A  . Number of children: N/A  . Years of education: N/A   Social History Main Topics  . Smoking status: Current Every Day  Smoker    Packs/day: 0.50    Years: 55.00    Types: Cigarettes    Start date: 03/11/1961  . Smokeless tobacco: Never Used  . Alcohol use No  . Drug use: No  . Sexual activity: Yes    Birth control/ protection: None   Other Topics Concern  . None   Social History Narrative  . None    PERFORMANCE STATUS: The patient's performance status is 0 - Asymptomatic  PHYSICAL EXAM: Most Recent Vital Signs: Blood pressure 112/82, pulse (!) 112, temperature 98 F (36.7 C), temperature source Oral, resp. rate 18, weight 178 lb 8 oz (81 kg), SpO2 92 %. BP 112/82 (BP Location: Left Arm, Patient Position: Sitting)   Pulse (!) 112   Temp 98 F (36.7 C) (Oral)   Resp 18   Wt 178 lb 8 oz (81 kg)   SpO2 92%   BMI 28.81 kg/m   Physical Exam  Constitutional: He is oriented to person, place, and time and well-developed, well-nourished, and in no distress.  HENT:  Head: Normocephalic and atraumatic.  Mouth/Throat: Oropharynx is clear and moist.  Eyes: Conjunctivae and EOM are normal. Pupils are equal, round, and reactive to light.  Neck: Normal range of motion. Neck supple.  Cardiovascular: Normal rate, regular rhythm and normal heart sounds.   Pulmonary/Chest: Effort normal and breath sounds normal.  Abdominal: Soft. Bowel sounds are normal.  Musculoskeletal: Normal range of motion.  Neurological: He is alert and oriented to person, place, and time. Gait normal.  Skin: Skin is warm and dry.  Nursing note and vitals reviewed.  LABORATORY DATA:  No results found for this or any previous visit (from the past 48 hour(s)).   CBC    Component Value Date/Time   WBC 5.2 07/23/2016 1034   RBC 3.69 (L) 07/23/2016 1034   HGB 12.0 (L) 07/23/2016 1034   HCT 34.5 (L) 07/23/2016 1034   HCT 36.9 (L) 07/14/2016 1442   PLT 285 07/23/2016 1034   PLT 266 07/14/2016 1442   MCV 93.5 07/23/2016 1034   MCV 92 07/14/2016 1442   MCH 32.5 07/23/2016 1034   MCHC  34.8 07/23/2016 1034   RDW 14.8 07/23/2016  1034   RDW 15.1 07/14/2016 1442   LYMPHSABS 0.5 (L) 07/23/2016 1034   LYMPHSABS 0.5 (L) 07/14/2016 1442   MONOABS 0.4 07/23/2016 1034   EOSABS 0.1 07/23/2016 1034   EOSABS 0.1 07/14/2016 1442   BASOSABS 0.0 07/23/2016 1034   BASOSABS 0.0 07/14/2016 1442   CMP Latest Ref Rng & Units 07/23/2016 07/16/2016 07/14/2016  Glucose 65 - 99 mg/dL 155(H) 144(H) 189(H)  BUN 6 - 20 mg/dL 16 18 18   Creatinine 0.61 - 1.24 mg/dL 0.79 0.77 0.78  Sodium 135 - 145 mmol/L 135 136 140  Potassium 3.5 - 5.1 mmol/L 3.8 3.7 3.7  Chloride 101 - 111 mmol/L 98(L) 101 99  CO2 22 - 32 mmol/L 30 28 27   Calcium 8.9 - 10.3 mg/dL 8.9 9.0 9.2  Total Protein 6.5 - 8.1 g/dL 6.5 6.7 -  Total Bilirubin 0.3 - 1.2 mg/dL 0.3 0.5 -  Alkaline Phos 38 - 126 U/L 79 78 -  AST 15 - 41 U/L 19 15 -  ALT 17 - 63 U/L 19 20 -   RADIOGRAPHY: I have personally reviewed the radiological images as listed and agreed with the findings in the report.  DG Chest 2 View 07/08/2016 IMPRESSION: No consolidation or effusion.  MRI Brain 05/06/2016 IMPRESSION: Negative for metastatic disease.   PATHOLOGY:     ASSESSMENT/PLAN:  Non-small cell lung cancer, right (HCC) NSCLC of right lung with hilar and paratracheal lymphadenopathy on PET imaging, favoring adenocarcinoma.  Complicated staging situation but in giving the benefit of the doubt, he is staged as a Stage IIB (T1bN1M0) with two adjacent RUL pulmonary nodules, RIGHT-sided lymphadenopathy as described AND a LUL pulmonary nodule measuring 11 mm with low metabolic activity biopsied but without any malignancy identified.  Currently undergoing concomitant chemoradiation with weekly Carboplatin/Paclitaxel beginning on 06/25/2016.   Continue chemotherapy and radiation. He will proceed with cycle 5 out of 6 planned carbo/taxol treatments.  Encouraged him to use his magic mouthwash for mucositis.   Plan to order restaging scans on his next visit to assess response to chemoRT. Depending on scan  results we will then discuss whether he will get any additional consolidative chemotherapy followed by immunotherapy.   He will return for follow up next week.   THERAPY PLAN:  Concomitant chemoXRT with weekly Carboplatin/Paclitaxel +/- 2 additional cycles of Carboplatin/Paclitaxel full-dose every 21 days, followed by immunotherapy (Imfinzi) up to 52 weeks All questions were answered. The patient knows to call the clinic with any problems, questions or concerns. We can certainly see the patient much sooner if necessary.  This document serves as a record of services personally performed by Twana First, MD. It was created on her behalf by Martinique Casey, a trained medical scribe. The creation of this record is based on the scribe's personal observations and the provider's statements to them. This document has been checked and approved by the attending provider.  I have reviewed the above documentation for accuracy and completeness and I agree with the above.  This note is electronically signed MD:[email or web address] Sanda Klein  07/30/2016 10:38 AM

## 2016-07-30 NOTE — Patient Instructions (Signed)
[facility] Discharge Instructions for Patients Receiving Chemotherapy   Beginning January 23rd 2017 lab work for the [facility] will be done in the  Main lab at [facility] on 1st floor. If you have a lab appointment with the [place] please come in thru the  Main Entrance and check in at the main information desk   Today you received the following chemotherapy agents taxol and carbo.  To help prevent nausea and vomiting after your treatment, we encourage you to take your nausea medication as instructed.   If you develop nausea and vomiting, or diarrhea that is not controlled by your medication, call the clinic.  The clinic phone number is [phone]. Office hours are Monday-Friday 8:30am-5:00pm.  BELOW ARE SYMPTOMS THAT SHOULD BE REPORTED IMMEDIATELY:  *FEVER GREATER THAN 101.0 F  *CHILLS WITH OR WITHOUT FEVER  NAUSEA AND VOMITING THAT IS NOT CONTROLLED WITH YOUR NAUSEA MEDICATION  *UNUSUAL SHORTNESS OF BREATH  *UNUSUAL BRUISING OR BLEEDING  TENDERNESS IN MOUTH AND THROAT WITH OR WITHOUT PRESENCE OF ULCERS  *URINARY PROBLEMS  *BOWEL PROBLEMS  UNUSUAL RASH Items with * indicate a potential emergency and should be followed up as soon as possible. If you have an emergency after office hours please contact your primary care physician or go to the nearest emergency department.  Please call the clinic during office hours if you have any questions or concerns.   You may also contact the Patient Navigator at [phone] should you have any questions or need assistance in obtaining follow up care.      Resources For Cancer Patients and their Caregivers ? American Cancer Society: Can assist with transportation, wigs, general needs, runs Look Good Feel Better.        ([phone] 064-8112 ? Cancer Care: Provides financial assistance, online support groups, medication/co-pay assistance.  1-800-813-HOPE 952-614-7184) ? [place] Assists [place] cancer patients and their families through emotional , educational and financial support.  (270)735-2282 ? [place] DSS Where to apply for food stamps, Medicaid and utility assistance. (938)647-4762 ? RC[facility]: Transportation to medical appointments. 513-403-9940 ? Social Security Administration: May apply for disability if have a Stage IV cancer. 424-445-4718 269 032 1080 ? [employer], Disability and Transit Services: Assists with nutrition, care and transit needs. 743-470-5407

## 2016-07-30 NOTE — Patient Instructions (Addendum)
[place] at [facility] Discharge Instructions  RECOMMENDATIONS MADE BY THE CONSULTANT AND ANY TEST RESULTS WILL BE SENT TO YOUR REFERRING PHYSICIAN.  You saw Dr. Talbert Cage today. Follow up in 1 week. See Amy at checkout for appointments.  Thank you for choosing [place] at [facility] to provide your oncology and hematology care.  To afford each patient quality time with our provider, please arrive at least 15 minutes before your scheduled appointment time.    If you have a lab appointment with the [facility] please come in thru the  Main Entrance and check in at the main information desk  You need to re-schedule your appointment should you arrive 10 or more minutes late.  We strive to give you quality time with our providers, and arriving late affects you and other patients whose appointments are after yours.  Also, if you no show three or more times for appointments you may be dismissed from the clinic at the providers discretion.     Again, thank you for choosing [facility].  Our hope is that these requests will decrease the amount of time that you wait before being seen by our physicians.       _____________________________________________________________  Should you have questions after your visit to [facility], please contact our office at [phone] between the hours of 8:30 a.m. and 4:30 p.m.  Voicemails left after 4:30 p.m. will not be returned until the following business day.  For prescription refill requests, have your pharmacy contact our office.       Resources For Cancer Patients and their Caregivers ? American Cancer Society: Can assist with transportation, wigs, general needs, runs Look Good Feel Better.        458-187-2915 ? Cancer Care: Provides financial assistance, online support groups, medication/co-pay assistance.  1-800-813-HOPE 262-134-0182) ? [place] Assists [place] cancer patients and their families through emotional , educational and financial support.  551-856-7646 ? [place] DSS Where to apply for food stamps, Medicaid and utility assistance. 814-572-9631 ? [employer]: Transportation to medical appointments. (309) 087-1814 ? Social Security Administration: May apply for disability if have a Stage IV cancer. (928)713-0697 986-387-5047 ? [employer], Disability and Transit Services: Assists with nutrition, care and transit needs. [place] Support Programs: '@10RELATIVEDAYS'$ @ > Cancer Support Group  2nd Tuesday of the month 1pm-2pm, Journey Room  > Creative Journey  3rd Tuesday of the month 1130am-1pm, Journey Room  > Look Good Feel Better  1st Wednesday of the month 10am-12 noon, Journey Room (Call [place] to register [phone])

## 2016-07-31 DIAGNOSIS — C3401 Malignant neoplasm of right main bronchus: Secondary | ICD-10-CM | POA: Diagnosis not present

## 2016-07-31 DIAGNOSIS — Z510 Encounter for antineoplastic radiation therapy: Secondary | ICD-10-CM | POA: Diagnosis not present

## 2016-07-31 DIAGNOSIS — R1310 Dysphagia, unspecified: Secondary | ICD-10-CM | POA: Diagnosis not present

## 2016-08-01 DIAGNOSIS — R1310 Dysphagia, unspecified: Secondary | ICD-10-CM | POA: Diagnosis not present

## 2016-08-01 DIAGNOSIS — Z510 Encounter for antineoplastic radiation therapy: Secondary | ICD-10-CM | POA: Diagnosis not present

## 2016-08-01 DIAGNOSIS — C3401 Malignant neoplasm of right main bronchus: Secondary | ICD-10-CM | POA: Diagnosis not present

## 2016-08-04 ENCOUNTER — Encounter (HOSPITAL_COMMUNITY): Payer: Self-pay

## 2016-08-04 ENCOUNTER — Emergency Department (HOSPITAL_COMMUNITY)
Admission: EM | Admit: 2016-08-04 | Discharge: 2016-08-04 | Disposition: A | Discharge status: Home / Self Care | Payer: Medicare Other | Source: Home / Self Care | Attending: Emergency Medicine | Admitting: Emergency Medicine

## 2016-08-04 ENCOUNTER — Telehealth (HOSPITAL_COMMUNITY): Payer: Self-pay | Admitting: Emergency Medicine

## 2016-08-04 DIAGNOSIS — M25522 Pain in left elbow: Secondary | ICD-10-CM | POA: Diagnosis not present

## 2016-08-04 DIAGNOSIS — M25531 Pain in right wrist: Secondary | ICD-10-CM

## 2016-08-04 DIAGNOSIS — E871 Hypo-osmolality and hyponatremia: Secondary | ICD-10-CM | POA: Diagnosis not present

## 2016-08-04 DIAGNOSIS — Z7984 Long term (current) use of oral hypoglycemic drugs: Secondary | ICD-10-CM

## 2016-08-04 DIAGNOSIS — M109 Gout, unspecified: Secondary | ICD-10-CM | POA: Diagnosis not present

## 2016-08-04 DIAGNOSIS — F17210 Nicotine dependence, cigarettes, uncomplicated: Secondary | ICD-10-CM | POA: Insufficient documentation

## 2016-08-04 DIAGNOSIS — M25551 Pain in right hip: Secondary | ICD-10-CM | POA: Insufficient documentation

## 2016-08-04 DIAGNOSIS — I110 Hypertensive heart disease with heart failure: Secondary | ICD-10-CM | POA: Insufficient documentation

## 2016-08-04 DIAGNOSIS — E119 Type 2 diabetes mellitus without complications: Secondary | ICD-10-CM | POA: Insufficient documentation

## 2016-08-04 DIAGNOSIS — E1142 Type 2 diabetes mellitus with diabetic polyneuropathy: Secondary | ICD-10-CM | POA: Diagnosis not present

## 2016-08-04 DIAGNOSIS — I509 Heart failure, unspecified: Secondary | ICD-10-CM | POA: Insufficient documentation

## 2016-08-04 DIAGNOSIS — Z79899 Other long term (current) drug therapy: Secondary | ICD-10-CM

## 2016-08-04 DIAGNOSIS — J449 Chronic obstructive pulmonary disease, unspecified: Secondary | ICD-10-CM

## 2016-08-04 DIAGNOSIS — M25511 Pain in right shoulder: Secondary | ICD-10-CM

## 2016-08-04 DIAGNOSIS — C3491 Malignant neoplasm of unspecified part of right bronchus or lung: Secondary | ICD-10-CM | POA: Diagnosis not present

## 2016-08-04 DIAGNOSIS — M25512 Pain in left shoulder: Secondary | ICD-10-CM | POA: Diagnosis not present

## 2016-08-04 DIAGNOSIS — M25552 Pain in left hip: Secondary | ICD-10-CM | POA: Insufficient documentation

## 2016-08-04 DIAGNOSIS — M25532 Pain in left wrist: Secondary | ICD-10-CM | POA: Diagnosis not present

## 2016-08-04 DIAGNOSIS — K208 Other esophagitis: Secondary | ICD-10-CM | POA: Diagnosis not present

## 2016-08-04 LAB — CBC WITH DIFFERENTIAL/PLATELET
BASOS ABS: 0 10*3/uL (ref 0.0–0.1)
BASOS PCT: 0 %
Eosinophils Absolute: 0 10*3/uL (ref 0.0–0.7)
Eosinophils Relative: 0 %
HEMATOCRIT: 31.1 % — AB (ref 39.0–52.0)
Hemoglobin: 11.1 g/dL — ABNORMAL LOW (ref 13.0–17.0)
Lymphocytes Relative: 5 %
Lymphs Abs: 0.2 10*3/uL — ABNORMAL LOW (ref 0.7–4.0)
MCH: 33.6 pg (ref 26.0–34.0)
MCHC: 35.7 g/dL (ref 30.0–36.0)
MCV: 94.2 fL (ref 78.0–100.0)
MONO ABS: 0.3 10*3/uL (ref 0.1–1.0)
Monocytes Relative: 7 %
NEUTROS ABS: 4.1 10*3/uL (ref 1.7–7.7)
NEUTROS PCT: 88 %
Platelets: 246 10*3/uL (ref 150–400)
RBC: 3.3 MIL/uL — AB (ref 4.22–5.81)
RDW: 15.2 % (ref 11.5–15.5)
WBC: 4.6 10*3/uL (ref 4.0–10.5)

## 2016-08-04 LAB — BASIC METABOLIC PANEL
ANION GAP: 8 (ref 5–15)
BUN: 23 mg/dL — ABNORMAL HIGH (ref 6–20)
CO2: 29 mmol/L (ref 22–32)
Calcium: 8.9 mg/dL (ref 8.9–10.3)
Chloride: 96 mmol/L — ABNORMAL LOW (ref 101–111)
Creatinine, Ser: 0.8 mg/dL (ref 0.61–1.24)
GLUCOSE: 172 mg/dL — AB (ref 65–99)
POTASSIUM: 3.9 mmol/L (ref 3.5–5.1)
Sodium: 133 mmol/L — ABNORMAL LOW (ref 135–145)

## 2016-08-04 MED ORDER — HYDROMORPHONE HCL 1 MG/ML IJ SOLN
1.0000 mg | Freq: Once | INTRAMUSCULAR | Status: AC
  Administered 2016-08-04: 1 mg via INTRAVENOUS
  Filled 2016-08-04: qty 1

## 2016-08-04 MED ORDER — ONDANSETRON HCL 4 MG/2ML IJ SOLN
4.0000 mg | Freq: Once | INTRAMUSCULAR | Status: AC
  Administered 2016-08-04: 4 mg via INTRAVENOUS
  Filled 2016-08-04: qty 2

## 2016-08-04 MED ORDER — HEPARIN SOD (PORK) LOCK FLUSH 100 UNIT/ML IV SOLN
INTRAVENOUS | Status: AC
  Administered 2016-08-04: 500 [IU]
  Filled 2016-08-04: qty 5

## 2016-08-04 NOTE — Discharge Instructions (Signed)
Go ahead and get your fentanyl patch when you get discharged from the emergency department. Go ahead and place the patch on as soon as possible. Follow-up with her doctor as planned

## 2016-08-04 NOTE — ED Triage Notes (Signed)
Pt reports that he has pain in his left and right shoulders that radiates down arm and hips hurt as well. Pain began Friday. Spoke with cancer center and they will give him a patch today, but will take 24 hrs to go into effect, so was advised to come to ED for something for pain until patch helps.

## 2016-08-04 NOTE — ED Notes (Signed)
Pt and SO state understanding of d/c instructions, follow up care, and medication use. Denies further questions or concerns at this time.

## 2016-08-04 NOTE — ED Provider Notes (Signed)
[facility] DEPT Provider Note   CSN: [id number] Arrival date & time: 08/04/16  1125  By signing my name below, I, Higinio Plan, attest that this documentation has been prepared under the direction and in the presence of Milton Ferguson, MD . Electronically Signed: Higinio Plan, Scribe. 08/04/2016. 12:44 PM.  History   Chief Complaint Chief Complaint  Patient presents with  . Shoulder Pain   The history is provided by the patient. No language interpreter was used.  Shoulder Pain   This is a new problem. The current episode started more than 2 days ago. The problem occurs constantly. The problem has been gradually worsening. The pain is present in the right wrist, right shoulder, left shoulder, left arm, left lower leg, right hip and left hip. The pain is moderate.   HPI Comments: John [name] is a 71 y.o. male with PMHx of adenocarcinoma of right lung, DM, CHF, and arthritis, who presents to the Emergency Department complaining of gradually worsening, bilateral shoulder and arm pain that began 3 days ago. Pt reports his pain radiates into his right hand, bilateral hips, right posterior calf, and left groin. He states he is currently receiving chemotherapy and radiation treatments for lung cancer and is scheduled for his last treatment on 08/06/16. He notes he began to experience mild chest pain 1 week ago that has now resolved and was prescribed pain medication that "messed him up." He states he was told he would receive pain patches to relieve his symptoms but never received a prescription for this medication. Pt reports he spoke with the cancer center this morning and was told he would receive a pain patch today but it would take 24 hours to take effect so he was advised to visit the ED for pain management. Pt denies any other complaints.   Past Medical History:  Diagnosis Date  . Adenocarcinoma of lung, right ([place]) 04/18/2016  . Anxiety   . Arthritis   . CHF (congestive heart failure) ([place])     . COPD (chronic obstructive pulmonary disease) ([place])   . Depression   . Diabetes mellitus without complication ([facility])    no meds  . Dyspnea   . History of kidney stones   . Hyperlipidemia   . Hypertension   . Macular degeneration   . Neuropathy   . Non-small cell lung cancer, right ([place]) 04/18/2016  . Prostatitis   . Pulmonary nodule, left 07/16/2016  . Sleep apnea    cpap    Patient Active Problem List   Diagnosis Date Noted  . Pulmonary nodule, left 07/16/2016  . CHF (congestive heart failure) ([place]) 07/09/2016  . DM (diabetes mellitus) ([place]) 07/09/2016  . Non-small cell lung cancer, right ([place]) 04/18/2016  . HTN (hypertension) 02/01/2016  . COPD with acute bronchitis ([place]) 02/01/2016  . BPH (benign prostatic hyperplasia) 02/01/2016    Past Surgical History:  Procedure Laterality Date  . CATARACT EXTRACTION, BILATERAL Bilateral   . NO PAST SURGERIES    . PORTACATH PLACEMENT Left 06/13/2016   Procedure: INSERTION PORT-A-CATH;  Surgeon: Aviva Signs, MD;  Location: [facility];  Service: General;  Laterality: Left;  Marland Kitchen VIDEO BRONCHOSCOPY WITH ENDOBRONCHIAL NAVIGATION N/A 05/28/2016   Procedure: VIDEO BRONCHOSCOPY WITH ENDOBRONCHIAL NAVIGATION;  Surgeon: [name], MD;  Location: [facility];  Service: Thoracic;  Laterality: N/A;  . VIDEO BRONCHOSCOPY WITH ENDOBRONCHIAL ULTRASOUND N/A 05/28/2016   Procedure: VIDEO BRONCHOSCOPY WITH ENDOBRONCHIAL ULTRASOUND;  Surgeon: [name], MD;  Location: [facility];  Service: Thoracic;  Laterality:  N/A;       Home Medications    Prior to Admission medications   Medication Sig Start Date End Date Taking? Authorizing Provider  albuterol (PROVENTIL HFA;VENTOLIN HFA) 108 (90 Base) MCG/ACT inhaler Inhale 2 puffs into the lungs every 6 (six) hours as needed for wheezing or shortness of breath. 06/05/16   Baird Cancer, PA-C  aspirin EC 81 MG tablet Take 81 mg by mouth daily.    Historical Provider, MD  blood glucose meter kit and  supplies KIT Dispense based on patient and insurance preference. Use up to 2 times daily as directed. (FOR ICD-9 250.00, 250.01). 06/27/16   [name], MD  cloNIDine (CATAPRES) 0.1 MG tablet Take 1 tablet (0.1 mg total) by mouth 3 (three) times daily. 05/19/16   [name], MD  dexamethasone (DECADRON) 4 MG tablet Take 2 tablets (8 mg total) by mouth daily. Start the day after chemotherapy for 2 days. 06/10/16   Twana First, MD  fentaNYL (DURAGESIC - DOSED MCG/HR) 12 MCG/HR Place 1 patch (12.5 mcg total) onto the skin every 3 (three) days. 07/28/16   Baird Cancer, PA-C  finasteride (PROSCAR) 5 MG tablet TAKE 1 TABLET EVERY DAY 06/03/16   [name], MD  fluticasone [facility]) 50 MCG/ACT nasal spray Place 2 sprays into both nostrils daily. Patient taking differently: Place 2 sprays into both nostrils at bedtime as needed for allergies.  12/07/15   Wardell Honour, MD  gabapentin (NEURONTIN) 300 MG capsule Take 300-600 mg by mouth 2 (two) times daily. Pt takes one capsule in the morning and two at bedtime.    Historical Provider, MD  glipiZIDE (GLUCOTROL) 5 MG tablet Take 1 tablet (5 mg total) by mouth daily before breakfast. 06/27/16   [name], MD  HYDROcodone-acetaminophen (NORCO) 5-325 MG tablet Take 0.5-1 tablets by mouth every 6 (six) hours as needed for moderate pain. 07/23/16   Baird Cancer, PA-C  INCRUSE ELLIPTA 62.5 MCG/INH AEPB INHALE 1 PUFF INTO THE LUNGS DAILY. 05/09/16   Wardell Honour, MD  irbesartan-hydrochlorothiazide (AVALIDE) 300-12.5 MG tablet Take 1 tablet by mouth daily. 04/17/16   [name], MD  levofloxacin (LEVAQUIN) 500 MG tablet Take 1 tablet (500 mg total) by mouth daily. 07/10/16   Kathie Dike, MD  lidocaine-prilocaine (EMLA) cream Apply to affected area once 06/10/16   Twana First, MD  linagliptin (TRADJENTA) 5 MG TABS tablet Take 1 tablet (5 mg total) by mouth daily. 07/14/16   [name], MD  LORazepam (ATIVAN) 0.5 MG tablet TAKE 1  TABLET BY MOUTH TWICE A DAY AS NEEDED 07/03/16   [name], MD  magic mouthwash w/lidocaine SOLN USE 1 TEASPOONFUL BY 15MINS BEFORE MEALS AND AS NEEDED FOR [place] AND SWALLOW 07/08/16   Historical Provider, MD  meloxicam (MOBIC) 7.5 MG tablet TAKE 1 TABLET EVERY DAY FOR BACK PAIN Patient taking differently: TAKE 1 TABLET DAILY AS NEEDED FOR BACK PAIN 04/28/16   [name], MD  metoprolol succinate (TOPROL-XL) 50 MG 24 hr tablet Take 1 tablet (50 mg total) by mouth daily. Take with or immediately following a meal. Patient taking differently: Take 50 mg by mouth at bedtime. Take with or immediately following a meal. 05/13/16   [name], MD  nystatin (MYCOSTATIN) 100000 UNIT/ML suspension  07/08/16   Historical Provider, MD  omeprazole (PRILOSEC) 20 MG capsule Take 20 mg by mouth daily.  07/07/16   Historical Provider, MD  ondansetron (ZOFRAN) 8  MG tablet Take 1 tablet (8 mg total) by mouth 2 (two) times daily as needed for refractory nausea / vomiting. Start on day 3 after chemo. 06/10/16   Twana First, MD  pravastatin (PRAVACHOL) 40 MG tablet Take 40 mg by mouth every 3 (three) days. M-W-F 11/12/15   Historical Provider, MD  prochlorperazine (COMPAZINE) 10 MG tablet Take 1 tablet (10 mg total) by mouth every 6 (six) hours as needed (Nausea or vomiting). 06/10/16   Twana First, MD  sucralfate (CARAFATE) 1 g tablet Take 1 tablet (1 g total) by mouth 4 (four) times daily -  with meals and at bedtime. 07/30/16   Baird Cancer, PA-C  tamsulosin (FLOMAX) 0.4 MG CAPS capsule Take 0.4 mg by mouth daily after breakfast.    Historical Provider, MD  traZODone (DESYREL) 50 MG tablet Take 1 tablet (50 mg total) by mouth at bedtime as needed for sleep. 07/10/16   Kathie Dike, MD    Family History Family History  Problem Relation Age of Onset  . Hypertension Mother   . Diabetes Father   . Heart disease Father   . Hypertension Sister     Social History Social History  Substance Use  Topics  . Smoking status: Current Every Day Smoker    Packs/day: 0.50    Years: 55.00    Types: Cigarettes    Start date: 03/11/1961  . Smokeless tobacco: Never Used  . Alcohol use No     Allergies   Patient has no known allergies.   Review of Systems Review of Systems  Constitutional: Negative for appetite change and fatigue.  HENT: Negative for congestion, ear discharge and sinus pressure.   Eyes: Negative for discharge.  Respiratory: Negative for cough.   Cardiovascular: Negative for chest pain.  Gastrointestinal: Negative for abdominal pain and diarrhea.  Genitourinary: Negative for frequency and hematuria.  Musculoskeletal: Positive for arthralgias and myalgias. Negative for back pain.  Skin: Negative for rash.  Neurological: Negative for seizures and headaches.  Psychiatric/Behavioral: Negative for hallucinations.   Physical Exam Updated Vital Signs BP 136/62 (BP Location: Left Arm)   Pulse (!) 116   Temp 98.8 F (37.1 C) (Oral)   Resp 20   Ht _0  (1.676 m)   Wt 179 lb (81.2 kg)   SpO2 97%   BMI 28.89 kg/m   Physical Exam  Constitutional: He is oriented to person, place, and time. He appears well-developed.  HENT:  Head: Normocephalic.  Eyes: Conjunctivae and EOM are normal. No scleral icterus.  Neck: Neck supple. No thyromegaly present.  Cardiovascular: Normal rate and regular rhythm.  Exam reveals no gallop and no friction rub.   No murmur heard. Pulmonary/Chest: No stridor. He has no wheezes. He has no rales. He exhibits no tenderness.  Abdominal: He exhibits no distension. There is no tenderness. There is no rebound.  Musculoskeletal: Normal range of motion. He exhibits no edema.  Tenderness noted to bilateral shoulder, bilateral hips, and right wrist.   Lymphadenopathy:    He has no cervical adenopathy.  Neurological: He is oriented to person, place, and time. He exhibits normal muscle tone. Coordination normal.  Skin: No rash noted. No erythema.    Psychiatric: He has a normal mood and affect. His behavior is normal.   ED Treatments / Results  DIAGNOSTIC STUDIES:  Oxygen Saturation is 97% on RA, normal by my interpretation.    COORDINATION OF CARE:  12:36 PM Discussed treatment plan with pt at bedside and pt agreed  to plan.  Labs (all labs ordered are listed, but only abnormal results are displayed) Labs Reviewed - No data to display  EKG  EKG Interpretation None       Radiology No results found.  Procedures Procedures (including critical care time)  Medications Ordered in ED Medications - No data to display   Initial Impression / Assessment and Plan / ED Course  I have reviewed the triage vital signs and the nursing notes.  Pertinent labs & imaging results that were available during my care of the patient were reviewed by me and considered in my medical decision making (see chart for details).     Labs unremarkable. Patient states his arthralgias are significantly improved with one dose of Dilaudid. He is supposed to get his fentanyl patch today in place and on. Suspect joint inflammation secondary to chemotherapy and radiation for lung cancer. He is to follow-up with his cancer.  The chart was scribed for me under my direct supervision.  I personally performed the history, physical, and medical decision making and all procedures in the evaluation of this patient..   Final Clinical Impressions(s) / ED Diagnoses   Final diagnoses:  None    New Prescriptions New Prescriptions   No medications on file     Milton Ferguson, MD 08/04/16 1418

## 2016-08-04 NOTE — ED Notes (Signed)
Pt reports he did not receive his Fentanyl patch like usual and has diffuse pain. Per pt's spouse pt has patches to pick up at the pharmacy.

## 2016-08-04 NOTE — Telephone Encounter (Signed)
Pt called and states that he is having terrible pain in all extremities, left shoulder down into hands, hands cramping, right legs.  Feet are a little more numb than normal but he has not taken his gabapentin this am bc he he having nausea.  He is taking his nausea medication.  He has had this pain since Friday.  I found his Fentanyl prescription at the desk.  Spoke with Kirby Crigler PA with spoke with radiation doctor also.  Dr Raliegh Ip had told pt tp go to the ER.  Gershon Mussel is in agreement.  Told pt to go to the ER to be evaluated.  I told him to come up to to the cancer center nurses station to pick up his fentanyl prescription.  I explained he changed this patch every 3 days and rotate the site.  I explained that this would deliever a steady small amount of pain medication over the 3 days.  It will take about 24 hours before he will feel it when he puts on the first patch.

## 2016-08-05 ENCOUNTER — Ambulatory Visit: Payer: Medicare Other | Admitting: Family Medicine

## 2016-08-05 ENCOUNTER — Encounter (HOSPITAL_COMMUNITY): Payer: Self-pay | Admitting: *Deleted

## 2016-08-05 ENCOUNTER — Inpatient Hospital Stay (HOSPITAL_COMMUNITY)
Admission: EM | Admit: 2016-08-05 | Discharge: 2016-08-06 | DRG: 554 | Disposition: A | Discharge status: Home / Self Care | Payer: Medicare Other | Attending: Family Medicine | Admitting: Family Medicine

## 2016-08-05 ENCOUNTER — Other Ambulatory Visit: Payer: Self-pay

## 2016-08-05 DIAGNOSIS — G4733 Obstructive sleep apnea (adult) (pediatric): Secondary | ICD-10-CM | POA: Diagnosis present

## 2016-08-05 DIAGNOSIS — M25522 Pain in left elbow: Secondary | ICD-10-CM

## 2016-08-05 DIAGNOSIS — M25511 Pain in right shoulder: Secondary | ICD-10-CM | POA: Diagnosis not present

## 2016-08-05 DIAGNOSIS — R3912 Poor urinary stream: Secondary | ICD-10-CM | POA: Diagnosis not present

## 2016-08-05 DIAGNOSIS — I509 Heart failure, unspecified: Secondary | ICD-10-CM | POA: Diagnosis present

## 2016-08-05 DIAGNOSIS — N401 Enlarged prostate with lower urinary tract symptoms: Secondary | ICD-10-CM | POA: Diagnosis present

## 2016-08-05 DIAGNOSIS — Z7982 Long term (current) use of aspirin: Secondary | ICD-10-CM | POA: Diagnosis not present

## 2016-08-05 DIAGNOSIS — M109 Gout, unspecified: Principal | ICD-10-CM | POA: Diagnosis present

## 2016-08-05 DIAGNOSIS — Z7951 Long term (current) use of inhaled steroids: Secondary | ICD-10-CM

## 2016-08-05 DIAGNOSIS — E119 Type 2 diabetes mellitus without complications: Secondary | ICD-10-CM | POA: Diagnosis not present

## 2016-08-05 DIAGNOSIS — N400 Enlarged prostate without lower urinary tract symptoms: Secondary | ICD-10-CM | POA: Diagnosis present

## 2016-08-05 DIAGNOSIS — E785 Hyperlipidemia, unspecified: Secondary | ICD-10-CM | POA: Diagnosis present

## 2016-08-05 DIAGNOSIS — Y842 Radiological procedure and radiotherapy as the cause of abnormal reaction of the patient, or of later complication, without mention of misadventure at the time of the procedure: Secondary | ICD-10-CM | POA: Diagnosis present

## 2016-08-05 DIAGNOSIS — M7989 Other specified soft tissue disorders: Secondary | ICD-10-CM | POA: Diagnosis not present

## 2016-08-05 DIAGNOSIS — I110 Hypertensive heart disease with heart failure: Secondary | ICD-10-CM | POA: Diagnosis present

## 2016-08-05 DIAGNOSIS — K208 Other esophagitis: Secondary | ICD-10-CM | POA: Diagnosis present

## 2016-08-05 DIAGNOSIS — E1142 Type 2 diabetes mellitus with diabetic polyneuropathy: Secondary | ICD-10-CM | POA: Diagnosis present

## 2016-08-05 DIAGNOSIS — M2550 Pain in unspecified joint: Secondary | ICD-10-CM | POA: Diagnosis present

## 2016-08-05 DIAGNOSIS — Z87442 Personal history of urinary calculi: Secondary | ICD-10-CM

## 2016-08-05 DIAGNOSIS — M25512 Pain in left shoulder: Secondary | ICD-10-CM

## 2016-08-05 DIAGNOSIS — M25531 Pain in right wrist: Secondary | ICD-10-CM | POA: Diagnosis not present

## 2016-08-05 DIAGNOSIS — J449 Chronic obstructive pulmonary disease, unspecified: Secondary | ICD-10-CM | POA: Diagnosis not present

## 2016-08-05 DIAGNOSIS — I10 Essential (primary) hypertension: Secondary | ICD-10-CM | POA: Diagnosis present

## 2016-08-05 DIAGNOSIS — E871 Hypo-osmolality and hyponatremia: Secondary | ICD-10-CM | POA: Diagnosis not present

## 2016-08-05 DIAGNOSIS — M25532 Pain in left wrist: Secondary | ICD-10-CM

## 2016-08-05 DIAGNOSIS — Z833 Family history of diabetes mellitus: Secondary | ICD-10-CM | POA: Diagnosis not present

## 2016-08-05 DIAGNOSIS — E1165 Type 2 diabetes mellitus with hyperglycemia: Secondary | ICD-10-CM | POA: Diagnosis present

## 2016-08-05 DIAGNOSIS — F329 Major depressive disorder, single episode, unspecified: Secondary | ICD-10-CM | POA: Diagnosis present

## 2016-08-05 DIAGNOSIS — Z7984 Long term (current) use of oral hypoglycemic drugs: Secondary | ICD-10-CM

## 2016-08-05 DIAGNOSIS — E1159 Type 2 diabetes mellitus with other circulatory complications: Secondary | ICD-10-CM | POA: Diagnosis present

## 2016-08-05 DIAGNOSIS — Z8249 Family history of ischemic heart disease and other diseases of the circulatory system: Secondary | ICD-10-CM | POA: Diagnosis not present

## 2016-08-05 DIAGNOSIS — Z9842 Cataract extraction status, left eye: Secondary | ICD-10-CM | POA: Diagnosis not present

## 2016-08-05 DIAGNOSIS — Z9841 Cataract extraction status, right eye: Secondary | ICD-10-CM

## 2016-08-05 DIAGNOSIS — I152 Hypertension secondary to endocrine disorders: Secondary | ICD-10-CM | POA: Diagnosis present

## 2016-08-05 DIAGNOSIS — N138 Other obstructive and reflux uropathy: Secondary | ICD-10-CM | POA: Diagnosis present

## 2016-08-05 DIAGNOSIS — T380X5A Adverse effect of glucocorticoids and synthetic analogues, initial encounter: Secondary | ICD-10-CM | POA: Diagnosis present

## 2016-08-05 DIAGNOSIS — C3491 Malignant neoplasm of unspecified part of right bronchus or lung: Secondary | ICD-10-CM | POA: Diagnosis present

## 2016-08-05 DIAGNOSIS — F17210 Nicotine dependence, cigarettes, uncomplicated: Secondary | ICD-10-CM | POA: Diagnosis present

## 2016-08-05 LAB — CBC WITH DIFFERENTIAL/PLATELET
BASOS ABS: 0 10*3/uL (ref 0.0–0.1)
BASOS PCT: 0 %
EOS ABS: 0 10*3/uL (ref 0.0–0.7)
Eosinophils Relative: 0 %
HCT: 31.2 % — ABNORMAL LOW (ref 39.0–52.0)
Hemoglobin: 11 g/dL — ABNORMAL LOW (ref 13.0–17.0)
Lymphocytes Relative: 4 %
Lymphs Abs: 0.2 10*3/uL — ABNORMAL LOW (ref 0.7–4.0)
MCH: 33.4 pg (ref 26.0–34.0)
MCHC: 35.3 g/dL (ref 30.0–36.0)
MCV: 94.8 fL (ref 78.0–100.0)
MONO ABS: 0.5 10*3/uL (ref 0.1–1.0)
Monocytes Relative: 11 %
NEUTROS ABS: 4 10*3/uL (ref 1.7–7.7)
Neutrophils Relative %: 85 %
PLATELETS: 232 10*3/uL (ref 150–400)
RBC: 3.29 MIL/uL — ABNORMAL LOW (ref 4.22–5.81)
RDW: 15.5 % (ref 11.5–15.5)
WBC: 4.7 10*3/uL (ref 4.0–10.5)

## 2016-08-05 LAB — COMPREHENSIVE METABOLIC PANEL
ALBUMIN: 3 g/dL — AB (ref 3.5–5.0)
ALK PHOS: 69 U/L (ref 38–126)
ALT: 17 U/L (ref 17–63)
ANION GAP: 8 (ref 5–15)
AST: 12 U/L — ABNORMAL LOW (ref 15–41)
BILIRUBIN TOTAL: 0.6 mg/dL (ref 0.3–1.2)
BUN: 23 mg/dL — ABNORMAL HIGH (ref 6–20)
CALCIUM: 9 mg/dL (ref 8.9–10.3)
CO2: 28 mmol/L (ref 22–32)
CREATININE: 0.82 mg/dL (ref 0.61–1.24)
Chloride: 97 mmol/L — ABNORMAL LOW (ref 101–111)
GFR calc non Af Amer: >60 mL/min (ref >60)
GLUCOSE: 153 mg/dL — AB (ref 65–99)
Potassium: 4 mmol/L (ref 3.5–5.1)
SODIUM: 133 mmol/L — AB (ref 135–145)
TOTAL PROTEIN: 6.7 g/dL (ref 6.5–8.1)

## 2016-08-05 LAB — CBG MONITORING, ED: Glucose-Capillary: 175 mg/dL — ABNORMAL HIGH (ref 65–99)

## 2016-08-05 LAB — URINALYSIS, ROUTINE W REFLEX MICROSCOPIC
BACTERIA UA: NONE SEEN
BILIRUBIN URINE: NEGATIVE
GLUCOSE, UA: NEGATIVE mg/dL
HGB URINE DIPSTICK: NEGATIVE
KETONES UR: 5 mg/dL — AB
NITRITE: NEGATIVE
PROTEIN: 30 mg/dL — AB
Specific Gravity, Urine: 1.02 (ref 1.005–1.030)
pH: 6 (ref 5.0–8.0)

## 2016-08-05 LAB — SEDIMENTATION RATE: Sed Rate: 113 mm/hr — ABNORMAL HIGH (ref 0–16)

## 2016-08-05 LAB — URIC ACID: Uric Acid, Serum: 4.7 mg/dL (ref 4.4–7.6)

## 2016-08-05 LAB — CK: Total CK: 27 U/L — ABNORMAL LOW (ref 49–397)

## 2016-08-05 LAB — TSH: TSH: 1.139 u[IU]/mL (ref 0.350–4.500)

## 2016-08-05 MED ORDER — KETOROLAC TROMETHAMINE 15 MG/ML IJ SOLN: 15.0000 mg | Freq: Four times a day (QID) | INTRAMUSCULAR | Status: DC | PRN

## 2016-08-05 MED ORDER — TIOTROPIUM BROMIDE MONOHYDRATE 18 MCG IN CAPS
18.0000 ug | ORAL_CAPSULE | Freq: Every day | RESPIRATORY_TRACT | Status: DC
  Filled 2016-08-05: qty 5

## 2016-08-05 MED ORDER — ACETAMINOPHEN 325 MG PO TABS: 650.0000 mg | ORAL_TABLET | Freq: Four times a day (QID) | ORAL | Status: DC | PRN

## 2016-08-05 MED ORDER — DEXAMETHASONE SODIUM PHOSPHATE 10 MG/ML IJ SOLN
10.0000 mg | Freq: Once | INTRAMUSCULAR | Status: AC
  Administered 2016-08-05: 10 mg via INTRAVENOUS
  Filled 2016-08-05: qty 1

## 2016-08-05 MED ORDER — FLUTICASONE PROPIONATE 50 MCG/ACT NA SUSP: 2.0000 | Freq: Every evening | NASAL | Status: DC | PRN

## 2016-08-05 MED ORDER — MAGIC MOUTHWASH W/LIDOCAINE: 1.0000 mL | Freq: Four times a day (QID) | ORAL | Status: DC | PRN

## 2016-08-05 MED ORDER — INSULIN ASPART 100 UNIT/ML ~~LOC~~ SOLN
0.0000 [IU] | Freq: Three times a day (TID) | SUBCUTANEOUS | Status: DC
  Administered 2016-08-06: 1 [IU] via SUBCUTANEOUS
  Administered 2016-08-06: 2 [IU] via SUBCUTANEOUS

## 2016-08-05 MED ORDER — LORAZEPAM 0.5 MG PO TABS: 0.5000 mg | ORAL_TABLET | Freq: Two times a day (BID) | ORAL | Status: DC | PRN

## 2016-08-05 MED ORDER — SODIUM CHLORIDE 0.9 % IV BOLUS (SEPSIS)
1000.0000 mL | Freq: Once | INTRAVENOUS | Status: AC
  Administered 2016-08-05: 1000 mL via INTRAVENOUS

## 2016-08-05 MED ORDER — ACETAMINOPHEN 650 MG RE SUPP: 650.0000 mg | Freq: Four times a day (QID) | RECTAL | Status: DC | PRN

## 2016-08-05 MED ORDER — METOPROLOL SUCCINATE ER 50 MG PO TB24
50.0000 mg | ORAL_TABLET | Freq: Every day | ORAL | Status: DC
  Administered 2016-08-05: 50 mg via ORAL
  Filled 2016-08-05: qty 1

## 2016-08-05 MED ORDER — HYDROCODONE-ACETAMINOPHEN 5-325 MG PO TABS: 0.5000 | ORAL_TABLET | Freq: Four times a day (QID) | ORAL | Status: DC | PRN

## 2016-08-05 MED ORDER — MAGIC MOUTHWASH: 10.0000 mL | Freq: Four times a day (QID) | ORAL | Status: DC | PRN

## 2016-08-05 MED ORDER — PROCHLORPERAZINE MALEATE 5 MG PO TABS: 10.0000 mg | ORAL_TABLET | Freq: Four times a day (QID) | ORAL | Status: DC | PRN

## 2016-08-05 MED ORDER — GABAPENTIN 300 MG PO CAPS
300.0000 mg | ORAL_CAPSULE | Freq: Every day | ORAL | Status: DC
  Administered 2016-08-05 – 2016-08-06 (×2): 300 mg via ORAL
  Filled 2016-08-05 (×2): qty 1

## 2016-08-05 MED ORDER — GABAPENTIN 300 MG PO CAPS
600.0000 mg | ORAL_CAPSULE | Freq: Every day | ORAL | Status: DC
  Administered 2016-08-05: 600 mg via ORAL
  Filled 2016-08-05: qty 2

## 2016-08-05 MED ORDER — FENTANYL 12 MCG/HR TD PT72
12.5000 ug | MEDICATED_PATCH | TRANSDERMAL | Status: DC
  Filled 2016-08-05: qty 1

## 2016-08-05 MED ORDER — CLONIDINE HCL 0.1 MG PO TABS
0.1000 mg | ORAL_TABLET | Freq: Three times a day (TID) | ORAL | Status: DC
  Administered 2016-08-05 – 2016-08-06 (×4): 0.1 mg via ORAL
  Filled 2016-08-05 (×4): qty 1

## 2016-08-05 MED ORDER — TRAMADOL HCL 50 MG PO TABS: 50.0000 mg | ORAL_TABLET | Freq: Four times a day (QID) | ORAL | Status: DC | PRN

## 2016-08-05 MED ORDER — ASPIRIN EC 81 MG PO TBEC
81.0000 mg | DELAYED_RELEASE_TABLET | Freq: Every day | ORAL | Status: DC
  Administered 2016-08-05 – 2016-08-06 (×2): 81 mg via ORAL
  Filled 2016-08-05 (×2): qty 1

## 2016-08-05 MED ORDER — SUCRALFATE 1 G PO TABS
1.0000 g | ORAL_TABLET | Freq: Three times a day (TID) | ORAL | Status: DC
  Administered 2016-08-05 – 2016-08-06 (×5): 1 g via ORAL
  Filled 2016-08-05 (×6): qty 1

## 2016-08-05 MED ORDER — POLYETHYLENE GLYCOL 3350 17 G PO PACK: 17.0000 g | PACK | Freq: Every day | ORAL | Status: DC | PRN

## 2016-08-05 MED ORDER — SODIUM CHLORIDE 0.9 % IV SOLN
INTRAVENOUS | Status: DC
  Administered 2016-08-05 – 2016-08-06 (×3): via INTRAVENOUS

## 2016-08-05 MED ORDER — ENOXAPARIN SODIUM 40 MG/0.4ML ~~LOC~~ SOLN
40.0000 mg | SUBCUTANEOUS | Status: DC
  Administered 2016-08-05 – 2016-08-06 (×2): 40 mg via SUBCUTANEOUS
  Filled 2016-08-05 (×2): qty 0.4

## 2016-08-05 MED ORDER — ONDANSETRON HCL 4 MG PO TABS: 8.0000 mg | ORAL_TABLET | Freq: Two times a day (BID) | ORAL | Status: DC | PRN

## 2016-08-05 MED ORDER — ALBUTEROL SULFATE (2.5 MG/3ML) 0.083% IN NEBU: 2.5000 mg | INHALATION_SOLUTION | Freq: Four times a day (QID) | RESPIRATORY_TRACT | Status: DC | PRN

## 2016-08-05 MED ORDER — TRAZODONE HCL 50 MG PO TABS
50.0000 mg | ORAL_TABLET | Freq: Every evening | ORAL | Status: DC | PRN
  Administered 2016-08-05: 50 mg via ORAL
  Filled 2016-08-05: qty 1

## 2016-08-05 MED ORDER — BISACODYL 10 MG RE SUPP: 10.0000 mg | Freq: Every day | RECTAL | Status: DC | PRN

## 2016-08-05 MED ORDER — GABAPENTIN 300 MG PO CAPS
600.0000 mg | ORAL_CAPSULE | Freq: Once | ORAL | Status: AC
  Administered 2016-08-05: 600 mg via ORAL
  Filled 2016-08-05: qty 2

## 2016-08-05 MED ORDER — LIDOCAINE VISCOUS 2 % MT SOLN: 10.0000 mL | Freq: Four times a day (QID) | OROMUCOSAL | Status: DC | PRN

## 2016-08-05 MED ORDER — ALBUTEROL SULFATE HFA 108 (90 BASE) MCG/ACT IN AERS: 2.0000 | INHALATION_SPRAY | Freq: Four times a day (QID) | RESPIRATORY_TRACT | Status: DC | PRN

## 2016-08-05 MED ORDER — ONDANSETRON HCL 4 MG/2ML IJ SOLN
4.0000 mg | Freq: Once | INTRAMUSCULAR | Status: AC
  Administered 2016-08-05: 4 mg via INTRAVENOUS
  Filled 2016-08-05: qty 2

## 2016-08-05 MED ORDER — DEXAMETHASONE 4 MG PO TABS
8.0000 mg | ORAL_TABLET | Freq: Every day | ORAL | Status: DC
  Administered 2016-08-05 – 2016-08-06 (×2): 8 mg via ORAL
  Filled 2016-08-05 (×2): qty 2

## 2016-08-05 MED ORDER — FENTANYL CITRATE (PF) 100 MCG/2ML IJ SOLN
50.0000 ug | Freq: Once | INTRAMUSCULAR | Status: AC
  Administered 2016-08-05: 50 ug via INTRAVENOUS
  Filled 2016-08-05: qty 2

## 2016-08-05 MED ORDER — FINASTERIDE 5 MG PO TABS
5.0000 mg | ORAL_TABLET | Freq: Every day | ORAL | Status: DC
  Administered 2016-08-05 – 2016-08-06 (×2): 5 mg via ORAL
  Filled 2016-08-05 (×4): qty 1

## 2016-08-05 MED ORDER — TAMSULOSIN HCL 0.4 MG PO CAPS
0.4000 mg | ORAL_CAPSULE | Freq: Every day | ORAL | Status: DC
  Administered 2016-08-05 – 2016-08-06 (×2): 0.4 mg via ORAL
  Filled 2016-08-05 (×2): qty 1

## 2016-08-05 MED ORDER — UMECLIDINIUM BROMIDE 62.5 MCG/INH IN AEPB
1.0000 | INHALATION_SPRAY | Freq: Every day | RESPIRATORY_TRACT | Status: DC
  Administered 2016-08-06: 08:00:00 1 via RESPIRATORY_TRACT
  Filled 2016-08-05: qty 7

## 2016-08-05 MED ORDER — COLCHICINE 0.6 MG PO TABS
0.6000 mg | ORAL_TABLET | Freq: Once | ORAL | Status: AC
  Administered 2016-08-05: 0.6 mg via ORAL
  Filled 2016-08-05: qty 1

## 2016-08-05 NOTE — H&P (Signed)
History and Physical    John [name] TWK:[id number] DOB: 08-18-1945 DOA: 08/05/2016  PCP: [name], MD   Patient coming from: Home  Chief Complaint: Joint Pain and swelling of hands  HPI: John [name] is a 71 y.o. male with medical history significant of NSCLC of Right Lung on Chemo with Carbo/Taxol, COPD, CHDF, Depression, Arthritis, Anxiety, HTN, HLD, Sleep Apnea, and Other comorbids who presented to [facility] with worsening joint pain and swelling of his hands. Patient was seen in the ED the day prior with similar complaints and was discharged home by the EDP and returned with worsening symptoms. Patient states that on Wednesday he received Chemotherapy and that on Thursday he was mowing his lawn and weed eating. On Friday he developed Right shoulder pain and then on Saturday it worsend and started noticing swelling in his hands. He specifically noted pain in Right and Left Shoulder along with Bilateral hand swelling with Right worse than Left along with Hip and Groin Pain. He was discharged home by EDP the first time he came in with a Fentanyl Patch. He represented to the ED in the Early morning of 08/05/16 with Hand pain and swelling. He has noticed he has had difficulty walking due to the Joint Pain in his proximal inner thighs. [facility] was called to admit patient.   ED Course: Worked up and had blood work drawn and given IVF, IV Steroids, Zofran and started back on Gabapentin. He was given IV Fentanyl for his pain and admitted for possible Pseudogout vs. Gout.   Review of Systems: As per HPI otherwise 10 point review of systems negative. No Lightheadedness or dizziness.  Past Medical History:  Diagnosis Date  . Adenocarcinoma of lung, right ([place]) 04/18/2016  . Anxiety   . Arthritis   . CHF (congestive heart failure) ([place])   . COPD (chronic obstructive pulmonary disease) ([place])   . Depression   . Diabetes mellitus without complication (HCC)    no meds  . Dyspnea   . History of  kidney stones   . Hyperlipidemia   . Hypertension   . Macular degeneration   . Neuropathy   . Non-small cell lung cancer, right ([place]) 04/18/2016  . Prostatitis   . Pulmonary nodule, left 07/16/2016  . Sleep apnea    cpap   Past Surgical History:  Procedure Laterality Date  . CATARACT EXTRACTION, BILATERAL Bilateral   . NO PAST SURGERIES    . PORTACATH PLACEMENT Left 06/13/2016   Procedure: INSERTION PORT-A-CATH;  Surgeon: Aviva Signs, MD;  Location: [facility];  Service: General;  Laterality: Left;  Marland Kitchen VIDEO BRONCHOSCOPY WITH ENDOBRONCHIAL NAVIGATION N/A 05/28/2016   Procedure: VIDEO BRONCHOSCOPY WITH ENDOBRONCHIAL NAVIGATION;  Surgeon: [name], MD;  Location: [facility];  Service: Thoracic;  Laterality: N/A;  . VIDEO BRONCHOSCOPY WITH ENDOBRONCHIAL ULTRASOUND N/A 05/28/2016   Procedure: VIDEO BRONCHOSCOPY WITH ENDOBRONCHIAL ULTRASOUND;  Surgeon: [name], MD;  Location: [facility];  Service: Thoracic;  Laterality: N/A;   SOCIAL HISTORY  reports that he has been smoking Cigarettes.  He started smoking about 55 years ago. He has a 27.50 pack-year smoking history. He has never used smokeless tobacco. He reports that he does not drink alcohol or use drugs.  No Known Allergies  Family History  Problem Relation Age of Onset  . Hypertension Mother   . Diabetes Father   . Heart disease Father   . Hypertension Sister    Prior to Admission medications   Medication Sig Start Date End  Date Taking? Authorizing Provider  albuterol (PROVENTIL HFA;VENTOLIN HFA) 108 (90 Base) MCG/ACT inhaler Inhale 2 puffs into the lungs every 6 (six) hours as needed for wheezing or shortness of breath. 06/05/16   Baird Cancer, PA-C  aspirin EC 81 MG tablet Take 81 mg by mouth daily.    Historical Provider, MD  blood glucose meter kit and supplies KIT Dispense based on patient and insurance preference. Use up to 2 times daily as directed. (FOR ICD-9 250.00, 250.01). 06/27/16   [name], MD    cloNIDine (CATAPRES) 0.1 MG tablet Take 1 tablet (0.1 mg total) by mouth 3 (three) times daily. 05/19/16   [name], MD  dexamethasone (DECADRON) 4 MG tablet Take 2 tablets (8 mg total) by mouth daily. Start the day after chemotherapy for 2 days. 06/10/16   Twana First, MD  fentaNYL (DURAGESIC - DOSED MCG/HR) 12 MCG/HR Place 1 patch (12.5 mcg total) onto the skin every 3 (three) days. 07/28/16   Baird Cancer, PA-C  finasteride (PROSCAR) 5 MG tablet TAKE 1 TABLET EVERY DAY 06/03/16   [name], MD  fluticasone [facility]) 50 MCG/ACT nasal spray Place 2 sprays into both nostrils daily. Patient taking differently: Place 2 sprays into both nostrils at bedtime as needed for allergies.  12/07/15   Wardell Honour, MD  Fluticasone-Salmeterol (ADVAIR DISKUS IN) Inhale 1 puff into the lungs 2 (two) times daily.    Historical Provider, MD  gabapentin (NEURONTIN) 300 MG capsule Take 300-600 mg by mouth 2 (two) times daily. Pt takes one capsule in the morning and two at bedtime.    Historical Provider, MD  glipiZIDE (GLUCOTROL) 5 MG tablet Take 1 tablet (5 mg total) by mouth daily before breakfast. Patient not taking: Reported on 08/04/2016 06/27/16   [name], MD  HYDROcodone-acetaminophen (NORCO) 5-325 MG tablet Take 0.5-1 tablets by mouth every 6 (six) hours as needed for moderate pain. 07/23/16   Baird Cancer, PA-C  INCRUSE ELLIPTA 62.5 MCG/INH AEPB INHALE 1 PUFF INTO THE LUNGS DAILY. 05/09/16   Wardell Honour, MD  irbesartan-hydrochlorothiazide (AVALIDE) 300-12.5 MG tablet Take 1 tablet by mouth daily. 04/17/16   [name], MD  lidocaine-prilocaine (EMLA) cream Apply to affected area once 06/10/16   Twana First, MD  linagliptin (TRADJENTA) 5 MG TABS tablet Take 1 tablet (5 mg total) by mouth daily. 07/14/16   [name], MD  LORazepam (ATIVAN) 0.5 MG tablet TAKE 1 TABLET BY MOUTH TWICE A DAY AS NEEDED 07/03/16   [name], MD  magic mouthwash w/lidocaine SOLN USE 1  TEASPOONFUL BY 15MINS BEFORE MEALS AND AS NEEDED FOR [place] AND SWALLOW 07/08/16   Historical Provider, MD  meloxicam (MOBIC) 7.5 MG tablet TAKE 1 TABLET EVERY DAY FOR BACK PAIN Patient taking differently: TAKE 1 TABLET DAILY AS NEEDED FOR BACK PAIN 04/28/16   [name], MD  metoprolol succinate (TOPROL-XL) 50 MG 24 hr tablet Take 1 tablet (50 mg total) by mouth daily. Take with or immediately following a meal. Patient taking differently: Take 50 mg by mouth at bedtime. Take with or immediately following a meal. 05/13/16   [name], MD  ondansetron (ZOFRAN) 8 MG tablet Take 1 tablet (8 mg total) by mouth 2 (two) times daily as needed for refractory nausea / vomiting. Start on day 3 after chemo. 06/10/16   Twana First, MD  pravastatin (PRAVACHOL) 40 MG tablet Take 40 mg by mouth every 3 (three) days.  M-W-F 11/12/15   Historical Provider, MD  prochlorperazine (COMPAZINE) 10 MG tablet Take 1 tablet (10 mg total) by mouth every 6 (six) hours as needed (Nausea or vomiting). 06/10/16   Twana First, MD  sucralfate (CARAFATE) 1 g tablet Take 1 tablet (1 g total) by mouth 4 (four) times daily -  with meals and at bedtime. 07/30/16   Baird Cancer, PA-C  tamsulosin (FLOMAX) 0.4 MG CAPS capsule Take 0.4 mg by mouth daily after breakfast.    Historical Provider, MD  tiotropium (SPIRIVA) 18 MCG inhalation capsule Place 18 mcg into inhaler and inhale daily.    Historical Provider, MD  traZODone (DESYREL) 50 MG tablet Take 1 tablet (50 mg total) by mouth at bedtime as needed for sleep. 07/10/16   Kathie Dike, MD    Physical Exam: Vitals:   08/05/16 0656 08/05/16 1402 08/05/16 1942 08/05/16 2039  BP: 140/86 (!) 117/59 116/62   Pulse: (!) 117 97 94   Resp: (!) 22 16 20    Temp: 98.2 F (36.8 C) 98.3 F (36.8 C) 97.5 F (36.4 C)   TempSrc: Oral Axillary Oral   SpO2: 95% 93% 98% 97%  Weight: 78.7 kg (173 lb 6.4 oz)     Height: 5' 6"  (1.676 m)      Constitutional:  NAD and appears calm and  comfortable Eyes: Lids and conjunctivae normal, sclerae anicteric  ENMT: External Ears, Nose appear normal. Grossly normal hearing.  Neck: Appears normal, supple, no cervical masses, normal ROM, no appreciable thyromegaly Respiratory: Diminished to auscultation bilaterally, no wheezing, rales, rhonchi or crackles. Normal respiratory effort and patient is not tachypenic. No accessory muscle use. Patient has wet non-productive cough.  Cardiovascular: RRR slightly on the faster side, no murmurs / rubs / gallops. S1 and S2 auscultated. 1+ extremity edema.   Abdomen: Soft, non-tender, non-distended. No masses palpated. No appreciable hepatosplenomegaly. Bowel sounds positive x4.  GU: Deferred. Musculoskeletal: No clubbing / cyanosis of digits/nails. Bilateral hand swelling with Right worse than left. Patient has Pain in proximal muscles. ROM slightly decreased on left compared to Right.  Skin: No rashes, lesions, ulcers on limited skin evaluation. No induration; Warm and dry.  Neurologic: CN 2-12 grossly intact with no focal deficits. Romberg sign cerebellar reflexes not assessed.  Psychiatric: Normal judgment and insight. Alert and oriented x 3. Normal mood and appropriate affect.   Labs on Admission: I have personally reviewed following labs and imaging studies  CBC:  Recent Labs Lab 07/30/16 1045 08/04/16 1257 08/05/16 0414  WBC 4.7 4.6 4.7  NEUTROABS 4.0 4.1 4.0  HGB 11.4* 11.1* 11.0*  HCT 32.1* 31.1* 31.2*  MCV 93.9 94.2 94.8  PLT 230 246 037   Basic Metabolic Panel:  Recent Labs Lab 07/30/16 1045 08/04/16 1257 08/05/16 0414  NA 131* 133* 133*  K 3.5 3.9 4.0  CL 97* 96* 97*  CO2 26 29 28   GLUCOSE 220* 172* 153*  BUN 17 23* 23*  CREATININE 0.82 0.80 0.82  CALCIUM 8.8* 8.9 9.0  MG 1.8  --   --    GFR: Estimated Creatinine Clearance: 82.8 mL/min (by C-G formula based on SCr of 0.82 mg/dL). Liver Function Tests:  Recent Labs Lab 07/30/16 1045 08/05/16 0414  AST 20  12*  ALT 21 17  ALKPHOS 78 69  BILITOT 0.6 0.6  PROT 6.6 6.7  ALBUMIN 3.3* 3.0*   No results for input(s): LIPASE, AMYLASE in the last 168 hours. No results for input(s): AMMONIA in the last 168  hours. Coagulation Profile: No results for input(s): INR, PROTIME in the last 168 hours. Cardiac Enzymes:  Recent Labs Lab 08/05/16 0414  CKTOTAL 27*   BNP (last 3 results) No results for input(s): PROBNP in the last 8760 hours. HbA1C: No results for input(s): HGBA1C in the last 72 hours. CBG:  Recent Labs Lab 08/05/16 0316  GLUCAP 175*   Lipid Profile: No results for input(s): CHOL, HDL, LDLCALC, TRIG, CHOLHDL, LDLDIRECT in the last 72 hours. Thyroid Function Tests:  Recent Labs  08/05/16 0414  TSH 1.139   Anemia Panel: No results for input(s): VITAMINB12, FOLATE, FERRITIN, TIBC, IRON, RETICCTPCT in the last 72 hours. Urine analysis:    Component Value Date/Time   COLORURINE YELLOW 08/05/2016 0403   APPEARANCEUR CLEAR 08/05/2016 0403   LABSPEC 1.020 08/05/2016 0403   PHURINE 6.0 08/05/2016 0403   GLUCOSEU NEGATIVE 08/05/2016 0403   HGBUR NEGATIVE 08/05/2016 0403   BILIRUBINUR NEGATIVE 08/05/2016 0403   KETONESUR 5 (A) 08/05/2016 0403   PROTEINUR 30 (A) 08/05/2016 0403   NITRITE NEGATIVE 08/05/2016 0403   LEUKOCYTESUR TRACE (A) 08/05/2016 0403   Sepsis Labs: !!!!!!!!!!!!!!!!!!!!!!!!!!!!!!!!!!!!!!!!!!!! @LABRCNTIP (procalcitonin:4,lacticidven:4) ) Recent Results (from the past 240 hour(s))  Culture, blood (routine x 2)     Status: None (Preliminary result)   Collection Time: 08/05/16  4:15 AM  Result Value Ref Range Status   Specimen Description PORTA CATH DRAWN BY RN  Final   Special Requests   Final    BOTTLES DRAWN AEROBIC AND ANAEROBIC Blood Culture adequate volume   Culture NO GROWTH < 12 HOURS  Final   Report Status PENDING  Incomplete  Culture, blood (routine x 2)     Status: None (Preliminary result)   Collection Time: 08/05/16  4:24 AM  Result Value  Ref Range Status   Specimen Description BLOOD RIGHT ARM  Final   Special Requests   Final    BOTTLES DRAWN AEROBIC AND ANAEROBIC Blood Culture results may not be optimal due to an excessive volume of blood received in culture bottles   Culture NO GROWTH < 12 HOURS  Final   Report Status PENDING  Incomplete     Radiological Exams on Admission: No results found.  EKG: Independently reviewed. Showed Sinus Tachycardia at 113 and low voltage. q waves were in the inferior leads but no evidence of ST elevation on my interpretation.   Assessment/Plan Active Problems:   HTN (hypertension)   COPD (chronic obstructive pulmonary disease) (HCC)   BPH (benign prostatic hyperplasia)   Non-small cell lung cancer, right (HCC)   DM (diabetes mellitus) (HCC)   Joint pain   Bilateral hand swelling   Hyponatremia  Proximal Muscle Join Pain and Swelling of Hands - ?Pseudogout vs. Polymyalgia Rheumatica vs. Chemotherapy Reaction -Admitted to Telemetry -CK was 27 -Checked ESR and was 113; Uric Acid level normal at 4.7 -Need to Check CRP in AM -Consider giving IV Lasix of Hand Swelling but will hold off for now and monitor Hand pain and swelling  -Will need to Discuss with Ortho in AM about patient and possible Joint Aspiration -Blood Cx Pending -Started on Colchicine on Admission and given IV Decadron in ED  -May repeat IV Decadron in AM; C/w Home Dexamethasone 8 mg po Daily -C/w Fentanyl Duragesic 12.5 mcg TD Patch.  -C/w Gabapentin 300 mg po Daily and 600 mg po qHS -Consider giving po Prednisone for possible PMR -May need to discuss with Rheumatology  -Will Try 30 mg of Ketorolac 15 mg q6hprn.  -C/w  Hydrocodone-Acetaminophen 0.5-1 tablet po q6h moderate Pain  Mild Hyponatremia -Sodium was 133 -Continue to Monitor and repeat CMP in AM  Hyperglycemia in the setting of Diabetes Mellitus Type 2 and IV Steroids -Hold Home Linagliptin -Sensitive SSI -Check HbA1c -Continue to Monitor  CBG's  COPD -Currently not in Exacerbation -C/w Albuterol Inhaler and Fluticasone -C/w Incruse Ellipta  Hypertension -C/w Clonidine 0.1 mc po TID -C/w Metoprolol Succinate 50 mg po q Daily  Hyperlipidemia -Held Pravastatin 40 mg MWF because of Joint Pain  OSA -C/w Home CPAP  BPH/Prostate Enlargement with Hx of Prostatitis -C/w Tamsulosin 0.4 mg po qHS and with Finasteride 5 mg po Daily  NSCLC of Right Lung -C/w Dexamethasone 8 mg po Daily -C/w Analgesics -C/w Antiemetics -Patient supposed to receive last Chemo with Carbo/Taxol 08/06/16 -Continue to Follow up with Oncology in Outpatient Setting.   DVT prophylaxis: Lovenox 40 mg sq q24h Code Status: FULL CODE Family Communication: Discussed with wife at bedside Disposition Plan: Likely Home at D/C Consults called: None Admission status: Inpatient Telemetry  [facility], D.O. [facility] Pager [phone]  If 7PM-7AM, please contact night-coverage [email or web address] Password [email or web address]  08/05/2016, 11:11 PM

## 2016-08-05 NOTE — ED Notes (Signed)
Pt informed of need for urine sample. Pt states he does not have to go at this time but was given a urinal and instructed to let nursing staff know when able to provide one.

## 2016-08-05 NOTE — ED Provider Notes (Signed)
[facility] DEPT Provider Note   CSN: [id number] Arrival date & time: 08/05/16  0255  Time seen 3:45 AM   History   Chief Complaint Chief Complaint  Patient presents with  . Hand Pain    HPI John [name] is a 71 y.o. male.  HPI  patient states he was diagnosed with non-small cell lung cancer and he has been getting chemotherapy and radiation therapy since March. His last chemotherapy was April 25. He also has 10 more radiation treatments to the chest. This is been complicated by radiation esophagitis. Patient also has diabetes and peripheral neuropathy. He has been having nausea for the past couple days and has not been able to take his gabapentin since April 29. He states he started having soreness in his neck on April 27. He states it improved and on April 29 the pain on the right side resolved however he still is having pain on the left side. He also complains of pain in these proximal inner thighs and states he's having difficulty walking due to that pain. He also feels like his hands started swelling on April 29. He states he's never had this happen before. He states they are painful and describes it as sharp and constant. He complains of a lot of pain in his left shoulder. He denies any prior history of gout. He denies known fever but states he did have chills today for about 20 minutes and his caregiver states he felt warm. He has a mild chronic cough. He denies sore throat but did have discomfort on swallowing that was diagnosed with radiation esophagitis. He denies chest pain, shortness of breath, abdominal pain, but he does state he has had nausea without vomiting and mild diarrhea about 2 times a day with rectal incontinence. He denies any rectal bleeding. He states he has a history of arthritis but this pain is different. He was started on a fentanyl patch today by his oncologist and he placed it at 5 PM. He also was seen in the ED earlier today for the same symptoms.   PCP [name], MD [facility]   Past Medical History:  Diagnosis Date  . Adenocarcinoma of lung, right ([place]) 04/18/2016  . Anxiety   . Arthritis   . CHF (congestive heart failure) ([place])   . COPD (chronic obstructive pulmonary disease) ([place])   . Depression   . Diabetes mellitus without complication ([facility])    no meds  . Dyspnea   . History of kidney stones   . Hyperlipidemia   . Hypertension   . Macular degeneration   . Neuropathy   . Non-small cell lung cancer, right ([place]) 04/18/2016  . Prostatitis   . Pulmonary nodule, left 07/16/2016  . Sleep apnea    cpap    Patient Active Problem List   Diagnosis Date Noted  . Joint pain 08/05/2016  . Pulmonary nodule, left 07/16/2016  . CHF (congestive heart failure) ([place]) 07/09/2016  . DM (diabetes mellitus) ([place]) 07/09/2016  . Non-small cell lung cancer, right ([place]) 04/18/2016  . HTN (hypertension) 02/01/2016  . COPD with acute bronchitis ([place]) 02/01/2016  . BPH (benign prostatic hyperplasia) 02/01/2016    Past Surgical History:  Procedure Laterality Date  . CATARACT EXTRACTION, BILATERAL Bilateral   . NO PAST SURGERIES    . PORTACATH PLACEMENT Left 06/13/2016   Procedure: INSERTION PORT-A-CATH;  Surgeon: Aviva Signs, MD;  Location: [facility];  Service: General;  Laterality: Left;  Marland Kitchen VIDEO BRONCHOSCOPY WITH ENDOBRONCHIAL NAVIGATION N/A  05/28/2016   Procedure: VIDEO BRONCHOSCOPY WITH ENDOBRONCHIAL NAVIGATION;  Surgeon: [name], MD;  Location: [facility];  Service: Thoracic;  Laterality: N/A;  . VIDEO BRONCHOSCOPY WITH ENDOBRONCHIAL ULTRASOUND N/A 05/28/2016   Procedure: VIDEO BRONCHOSCOPY WITH ENDOBRONCHIAL ULTRASOUND;  Surgeon: [name], MD;  Location: [facility];  Service: Thoracic;  Laterality: N/A;       Home Medications    Prior to Admission medications   Medication Sig Start Date End Date Taking? Authorizing Provider  albuterol (PROVENTIL HFA;VENTOLIN HFA) 108 (90 Base) MCG/ACT inhaler Inhale 2 puffs into  the lungs every 6 (six) hours as needed for wheezing or shortness of breath. 06/05/16   Baird Cancer, PA-C  aspirin EC 81 MG tablet Take 81 mg by mouth daily.    Historical Provider, MD  blood glucose meter kit and supplies KIT Dispense based on patient and insurance preference. Use up to 2 times daily as directed. (FOR ICD-9 250.00, 250.01). 06/27/16   [name], MD  cloNIDine (CATAPRES) 0.1 MG tablet Take 1 tablet (0.1 mg total) by mouth 3 (three) times daily. 05/19/16   [name], MD  dexamethasone (DECADRON) 4 MG tablet Take 2 tablets (8 mg total) by mouth daily. Start the day after chemotherapy for 2 days. 06/10/16   Twana First, MD  fentaNYL (DURAGESIC - DOSED MCG/HR) 12 MCG/HR Place 1 patch (12.5 mcg total) onto the skin every 3 (three) days. 07/28/16   Baird Cancer, PA-C  finasteride (PROSCAR) 5 MG tablet TAKE 1 TABLET EVERY DAY 06/03/16   [name], MD  fluticasone [facility]) 50 MCG/ACT nasal spray Place 2 sprays into both nostrils daily. Patient taking differently: Place 2 sprays into both nostrils at bedtime as needed for allergies.  12/07/15   Wardell Honour, MD  Fluticasone-Salmeterol (ADVAIR DISKUS IN) Inhale 1 puff into the lungs 2 (two) times daily.    Historical Provider, MD  gabapentin (NEURONTIN) 300 MG capsule Take 300-600 mg by mouth 2 (two) times daily. Pt takes one capsule in the morning and two at bedtime.    Historical Provider, MD  glipiZIDE (GLUCOTROL) 5 MG tablet Take 1 tablet (5 mg total) by mouth daily before breakfast. Patient not taking: Reported on 08/04/2016 06/27/16   [name], MD  HYDROcodone-acetaminophen (NORCO) 5-325 MG tablet Take 0.5-1 tablets by mouth every 6 (six) hours as needed for moderate pain. 07/23/16   Baird Cancer, PA-C  INCRUSE ELLIPTA 62.5 MCG/INH AEPB INHALE 1 PUFF INTO THE LUNGS DAILY. 05/09/16   Wardell Honour, MD  irbesartan-hydrochlorothiazide (AVALIDE) 300-12.5 MG tablet Take 1 tablet by mouth daily. 04/17/16    [name], MD  levofloxacin (LEVAQUIN) 500 MG tablet Take 1 tablet (500 mg total) by mouth daily. Patient not taking: Reported on 08/04/2016 07/10/16   Kathie Dike, MD  lidocaine-prilocaine (EMLA) cream Apply to affected area once 06/10/16   Twana First, MD  linagliptin (TRADJENTA) 5 MG TABS tablet Take 1 tablet (5 mg total) by mouth daily. 07/14/16   [name], MD  LORazepam (ATIVAN) 0.5 MG tablet TAKE 1 TABLET BY MOUTH TWICE A DAY AS NEEDED 07/03/16   [name], MD  magic mouthwash w/lidocaine SOLN USE 1 TEASPOONFUL BY 15MINS BEFORE MEALS AND AS NEEDED FOR [place] AND SWALLOW 07/08/16   Historical Provider, MD  meloxicam (MOBIC) 7.5 MG tablet TAKE 1 TABLET EVERY DAY FOR BACK PAIN Patient taking differently: TAKE 1 TABLET DAILY AS NEEDED FOR BACK PAIN 04/28/16  [name], MD  metoprolol succinate (TOPROL-XL) 50 MG 24 hr tablet Take 1 tablet (50 mg total) by mouth daily. Take with or immediately following a meal. Patient taking differently: Take 50 mg by mouth at bedtime. Take with or immediately following a meal. 05/13/16   [name], MD  ondansetron (ZOFRAN) 8 MG tablet Take 1 tablet (8 mg total) by mouth 2 (two) times daily as needed for refractory nausea / vomiting. Start on day 3 after chemo. 06/10/16   Twana First, MD  pravastatin (PRAVACHOL) 40 MG tablet Take 40 mg by mouth every 3 (three) days. M-W-F 11/12/15   Historical Provider, MD  prochlorperazine (COMPAZINE) 10 MG tablet Take 1 tablet (10 mg total) by mouth every 6 (six) hours as needed (Nausea or vomiting). 06/10/16   Twana First, MD  sucralfate (CARAFATE) 1 g tablet Take 1 tablet (1 g total) by mouth 4 (four) times daily -  with meals and at bedtime. 07/30/16   Baird Cancer, PA-C  tamsulosin (FLOMAX) 0.4 MG CAPS capsule Take 0.4 mg by mouth daily after breakfast.    Historical Provider, MD  tiotropium (SPIRIVA) 18 MCG inhalation capsule Place 18 mcg into inhaler and inhale daily.    Historical  Provider, MD  traZODone (DESYREL) 50 MG tablet Take 1 tablet (50 mg total) by mouth at bedtime as needed for sleep. 07/10/16   Kathie Dike, MD    Family History Family History  Problem Relation Age of Onset  . Hypertension Mother   . Diabetes Father   . Heart disease Father   . Hypertension Sister     Social History Social History  Substance Use Topics  . Smoking status: Current Every Day Smoker    Packs/day: 0.50    Years: 55.00    Types: Cigarettes    Start date: 03/11/1961  . Smokeless tobacco: Never Used  . Alcohol use No     Allergies   Patient has no known allergies.   Review of Systems Review of Systems  All other systems reviewed and are negative.    Physical Exam Updated Vital Signs BP (!) 157/82 (BP Location: Right Arm)   Pulse (!) 111   Temp 98.4 F (36.9 C) (Oral)   Resp 20   Ht 5' 6"  (1.676 m)   Wt 179 lb (81.2 kg)   SpO2 94%   BMI 28.89 kg/m   Vital signs normal except for tachycardia   Physical Exam  Constitutional: He is oriented to person, place, and time. He appears well-developed and well-nourished.  Non-toxic appearance. He does not appear ill. No distress.  HENT:  Head: Normocephalic and atraumatic.  Right Ear: External ear normal.  Left Ear: External ear normal.  Nose: Nose normal. No mucosal edema or rhinorrhea.  Mouth/Throat: Oropharynx is clear and moist and mucous membranes are normal. No dental abscesses or uvula swelling.  Eyes: Conjunctivae and EOM are normal. Pupils are equal, round, and reactive to light.  Neck: Normal range of motion and full passive range of motion without pain. Neck supple.    Cardiovascular: Normal rate, regular rhythm and normal heart sounds.  Exam reveals no gallop and no friction rub.   No murmur heard. Pulmonary/Chest: Effort normal and breath sounds normal. No respiratory distress. He has no wheezes. He has no rhonchi. He has no rales. He exhibits no tenderness and no crepitus.  Abdominal:  Soft. Normal appearance and bowel sounds are normal. He exhibits no distension. There is no tenderness. There is  no rebound and no guarding.  Musculoskeletal: Normal range of motion. He exhibits edema and tenderness.  Patient does not have any tenderness in his knees, ankles, feet, or lower legs except in his proximal inner thighs. He's noted to have some mild diffuse redness and swelling of his left wrist with diffuse pain to palpation of his wrist and hand and fingers. He is also very tender to palpation his left shoulder and left elbow. He has minimal discomfort and to palpation in his right wrist and right hand.  Neurological: He is alert and oriented to person, place, and time. He has normal strength. No cranial nerve deficit.  Skin: Skin is warm, dry and intact. No rash noted. No erythema. No pallor.  Psychiatric: He has a normal mood and affect. His speech is normal and behavior is normal. His mood appears not anxious.  Nursing note and vitals reviewed.    ED Treatments / Results  Labs (all labs ordered are listed, but only abnormal results are displayed) Results for orders placed or performed during the hospital encounter of 08/05/16  Comprehensive metabolic panel  Result Value Ref Range   Sodium 133 (L) 135 - 145 mmol/L   Potassium 4.0 3.5 - 5.1 mmol/L   Chloride 97 (L) 101 - 111 mmol/L   CO2 28 22 - 32 mmol/L   Glucose, Bld 153 (H) 65 - 99 mg/dL   BUN 23 (H) 6 - 20 mg/dL   Creatinine, Ser 0.82 0.61 - 1.24 mg/dL   Calcium 9.0 8.9 - 10.3 mg/dL   Total Protein 6.7 6.5 - 8.1 g/dL   Albumin 3.0 (L) 3.5 - 5.0 g/dL   AST 12 (L) 15 - 41 U/L   ALT 17 17 - 63 U/L   Alkaline Phosphatase 69 38 - 126 U/L   Total Bilirubin 0.6 0.3 - 1.2 mg/dL   GFR calc non Af Amer >60 >60 mL/min   GFR calc Af Amer >60 >60 mL/min   Anion gap 8 5 - 15  CBC with Differential  Result Value Ref Range   WBC 4.7 4.0 - 10.5 K/uL   RBC 3.29 (L) 4.22 - 5.81 MIL/uL   Hemoglobin 11.0 (L) 13.0 - 17.0 g/dL   HCT  31.2 (L) 39.0 - 52.0 %   MCV 94.8 78.0 - 100.0 fL   MCH 33.4 26.0 - 34.0 pg   MCHC 35.3 30.0 - 36.0 g/dL   RDW 15.5 11.5 - 15.5 %   Platelets 232 150 - 400 K/uL   Neutrophils Relative % 85 %   Neutro Abs 4.0 1.7 - 7.7 K/uL   Lymphocytes Relative 4 %   Lymphs Abs 0.2 (L) 0.7 - 4.0 K/uL   Monocytes Relative 11 %   Monocytes Absolute 0.5 0.1 - 1.0 K/uL   Eosinophils Relative 0 %   Eosinophils Absolute 0.0 0.0 - 0.7 K/uL   Basophils Relative 0 %   Basophils Absolute 0.0 0.0 - 0.1 K/uL  CK  Result Value Ref Range   Total CK 27 (L) 49 - 397 U/L  Uric acid  Result Value Ref Range   Uric Acid, Serum 4.7 4.4 - 7.6 mg/dL  CBG monitoring, ED  Result Value Ref Range   Glucose-Capillary 175 (H) 65 - 99 mg/dL   Laboratory interpretation all normal except mild anemia    EKG  EKG Interpretation None      ED ECG REPORT   Date: 08/05/2016  Rate: 113  Rhythm: sinus tachycardia  QRS Axis: normal  Intervals: normal  ST/T Wave abnormalities: normal  Conduction Disutrbances:none  Narrative Interpretation:   Old EKG Reviewed: changes noted new q waves inferiorly since last EKG  I have personally reviewed the EKG tracing and agree with the computerized printout as noted.    Radiology No results found.  Procedures Procedures (including critical care time)  Medications Ordered in ED Medications  fentaNYL (SUBLIMAZE) injection 50 mcg (not administered)  colchicine tablet 0.6 mg (not administered)  ondansetron (ZOFRAN) injection 4 mg (4 mg Intravenous Given 08/05/16 0429)  sodium chloride 0.9 % bolus 1,000 mL (1,000 mLs Intravenous New Bag/Given 08/05/16 0429)  dexamethasone (DECADRON) injection 10 mg (10 mg Intravenous Given 08/05/16 0429)  gabapentin (NEURONTIN) capsule 600 mg (600 mg Oral Given 08/05/16 0429)     Initial Impression / Assessment and Plan / ED Course  I have reviewed the triage vital signs and the nursing notes.  Pertinent labs & imaging results that were  available during my care of the patient were reviewed by me and considered in my medical decision making (see chart for details).  Patient's port was accessed, blood cultures were drawn. He was given IV fluids and IV steroids for possible arthritis and Zofran for his nausea. He was started back on his gabapentin. CK was done to check for myositis with his complaints of pain in his thigh muscles bilaterally. Patient had a PET scan done on January 18 which showed the hypermetabolic nodules in the right upper lobe of his lungs and a less active nodule in the left upper lobe.  Recheck it 5:45 AM patient states he's not much improved. He was given fentanyl for pain IV. We discussed his test results. His uric acid level is normal. He states he cannot distinguish which joint hurts more either his left shoulder or his left wrist. He does appear to have a effusion in his left wrist. We discussed admission and orthopedic consultation to aspirate his left wrist joint. He may have gout, pseudogout, or less likely a bacterial infection of the joint. The fact that he has multiple joint involvement makes gout or pseudogout more likely.  05:55 AM Dr Marin Comment, hospitalist, will admit. Suggest starting on colchicine.  Final Clinical Impressions(s) / ED Diagnoses   Final diagnoses:  Pain of both wrist joints  Pain in joint of left shoulder  Arthralgia of left elbow    Plan admission  Rolland Porter, MD, Barbette Or, MD 08/05/16 [phone]

## 2016-08-05 NOTE — Progress Notes (Signed)
Pt refused the Fentanyl patch this am. Drawer failed, waited until [employer] mechanic arrived, drawer recovered and medication returned.

## 2016-08-05 NOTE — ED Triage Notes (Signed)
Pt c/o bilateral hand pain that has gotten worse today; pt states he has been having swelling to his bilateral arms and hands; pt last had chemo x 6 days ago

## 2016-08-06 ENCOUNTER — Ambulatory Visit (HOSPITAL_COMMUNITY): Payer: Medicare Other

## 2016-08-06 DIAGNOSIS — E119 Type 2 diabetes mellitus without complications: Secondary | ICD-10-CM

## 2016-08-06 DIAGNOSIS — M25531 Pain in right wrist: Secondary | ICD-10-CM

## 2016-08-06 DIAGNOSIS — M25532 Pain in left wrist: Secondary | ICD-10-CM

## 2016-08-06 DIAGNOSIS — N401 Enlarged prostate with lower urinary tract symptoms: Secondary | ICD-10-CM

## 2016-08-06 DIAGNOSIS — E871 Hypo-osmolality and hyponatremia: Secondary | ICD-10-CM

## 2016-08-06 DIAGNOSIS — R3912 Poor urinary stream: Secondary | ICD-10-CM

## 2016-08-06 DIAGNOSIS — M7989 Other specified soft tissue disorders: Secondary | ICD-10-CM

## 2016-08-06 DIAGNOSIS — M25522 Pain in left elbow: Secondary | ICD-10-CM

## 2016-08-06 DIAGNOSIS — M25512 Pain in left shoulder: Secondary | ICD-10-CM

## 2016-08-06 DIAGNOSIS — C3491 Malignant neoplasm of unspecified part of right bronchus or lung: Secondary | ICD-10-CM

## 2016-08-06 DIAGNOSIS — M25511 Pain in right shoulder: Secondary | ICD-10-CM

## 2016-08-06 LAB — GLUCOSE, CAPILLARY
GLUCOSE-CAPILLARY: 140 mg/dL — AB (ref 65–99)
Glucose-Capillary: 163 mg/dL — ABNORMAL HIGH (ref 65–99)

## 2016-08-06 LAB — COMPREHENSIVE METABOLIC PANEL
ALK PHOS: 58 U/L (ref 38–126)
ALT: 14 U/L — AB (ref 17–63)
ANION GAP: 5 (ref 5–15)
AST: 13 U/L — ABNORMAL LOW (ref 15–41)
Albumin: 2.4 g/dL — ABNORMAL LOW (ref 3.5–5.0)
BILIRUBIN TOTAL: 0.3 mg/dL (ref 0.3–1.2)
BUN: 22 mg/dL — ABNORMAL HIGH (ref 6–20)
CO2: 27 mmol/L (ref 22–32)
CREATININE: 0.69 mg/dL (ref 0.61–1.24)
Calcium: 8.4 mg/dL — ABNORMAL LOW (ref 8.9–10.3)
Chloride: 103 mmol/L (ref 101–111)
GFR calc non Af Amer: >60 mL/min (ref >60)
GLUCOSE: 150 mg/dL — AB (ref 65–99)
Potassium: 4 mmol/L (ref 3.5–5.1)
SODIUM: 135 mmol/L (ref 135–145)
TOTAL PROTEIN: 5.3 g/dL — AB (ref 6.5–8.1)

## 2016-08-06 LAB — CBC
HCT: 26 % — ABNORMAL LOW (ref 39.0–52.0)
Hemoglobin: 9.1 g/dL — ABNORMAL LOW (ref 13.0–17.0)
MCH: 33.1 pg (ref 26.0–34.0)
MCHC: 35 g/dL (ref 30.0–36.0)
MCV: 94.5 fL (ref 78.0–100.0)
PLATELETS: 229 10*3/uL (ref 150–400)
RBC: 2.75 MIL/uL — AB (ref 4.22–5.81)
RDW: 15.2 % (ref 11.5–15.5)
WBC: 3.8 10*3/uL — AB (ref 4.0–10.5)

## 2016-08-06 LAB — HEMOGLOBIN A1C
HEMOGLOBIN A1C: 8 % — AB (ref 4.8–5.6)
Mean Plasma Glucose: 183 mg/dL

## 2016-08-06 LAB — C-REACTIVE PROTEIN: CRP: 7.5 mg/dL — AB (ref <1.0)

## 2016-08-06 LAB — PHOSPHORUS: PHOSPHORUS: 3.5 mg/dL (ref 2.5–4.6)

## 2016-08-06 LAB — MAGNESIUM: MAGNESIUM: 2 mg/dL (ref 1.7–2.4)

## 2016-08-06 MED ORDER — DEXAMETHASONE 4 MG PO TABS: 8.0000 mg | ORAL_TABLET | Freq: Every day | ORAL | 0 refills | Status: DC

## 2016-08-06 MED ORDER — ACETAMINOPHEN 325 MG PO TABS: 650.0000 mg | ORAL_TABLET | Freq: Four times a day (QID) | ORAL | Status: DC | PRN

## 2016-08-06 MED ORDER — HEPARIN SOD (PORK) LOCK FLUSH 100 UNIT/ML IV SOLN
500.0000 [IU] | INTRAVENOUS | Status: DC | PRN
  Administered 2016-08-06: 500 [IU]
  Filled 2016-08-06: qty 5

## 2016-08-06 MED ORDER — DEXAMETHASONE 4 MG PO TABS: 8.0000 mg | ORAL_TABLET | Freq: Every day | ORAL | 0 refills | Status: AC

## 2016-08-06 NOTE — Progress Notes (Signed)
Patient discharged home with [facility] and flushed with heparin. Patient sent home with personal belongings and prescriptions.

## 2016-08-06 NOTE — Care Management Important Message (Signed)
Important Message  Patient Details  Name: John [name] MRN: [id number] Date of Birth: 09-Nov-1945   Medicare Important Message Given:  Yes    Gilbert Narain, Chauncey [place], RN 08/06/2016, 3:16 PM

## 2016-08-06 NOTE — Discharge Summary (Signed)
Physician Discharge Summary  John [name] SHF:[id number] DOB: [birth year] DOA: 08/05/2016  PCP: [name], MD  Admit date: 08/05/2016 Discharge date: 08/06/2016  Admitted From: Home  Disposition:  Home   Recommendations for Outpatient Follow-up:  1. Follow up with PCP in 1-2 weeks 2. Follow up with oncology next week 3. Take medications as prescribed 4. Please obtain BMP/CBC in one week   Home Health: No Equipment/Devices: None   Discharge Condition: Stable CODE STATUS: Full Code Diet recommendation: Heart Healthy / Carb Modified  Brief/Interim Summary: John [name] is a 71 y.o. male with medical history significant of NSCLC of Right Lung on Chemo with Carbo/Taxol, COPD, CHDF, Depression, Arthritis, Anxiety, HTN, HLD, Sleep Apnea, and Other comorbids who presented to [facility] with worsening joint pain and swelling of his hands. Patient was seen in the ED the day prior with similar complaints and was discharged home by the EDP and returned with worsening symptoms. Patient states that on Wednesday he received Chemotherapy and that on Thursday he was mowing his lawn and weed eating. On Friday he developed Right shoulder pain and then on Saturday it worsend and started noticing swelling in his hands. He specifically noted pain in Right and Left Shoulder along with Bilateral hand swelling with Right worse than Left along with Hip and Groin Pain. He was discharged home by EDP the first time he came in with a Fentanyl Patch. He represented to the ED in the Early morning of 08/05/16 with Hand pain and swelling. He has noticed he has had difficulty walking due to the Joint Pain in his proximal inner thighs. [facility] was called to admit patient.  He was given a dose of IV steroids and colchicine in the ED.  On 08/06/16 he was markedly improved and wanted to discharge.  He was sent home with a three day course of steroids and was told to discuss referral to rheumatology with his PCP.  Discharge Diagnoses:   Active Problems:   HTN (hypertension)   COPD (chronic obstructive pulmonary disease) (HCC)   BPH (benign prostatic hyperplasia)   Non-small cell lung cancer, right (HCC)   DM (diabetes mellitus) ([place])   Joint pain   Bilateral hand swelling   Hyponatremia    Discharge Instructions  Discharge Instructions    Call MD for:  difficulty breathing, headache or visual disturbances    Complete by:  As directed    Call MD for:  extreme fatigue    Complete by:  As directed    Call MD for:  hives    Complete by:  As directed    Call MD for:  persistant dizziness or light-headedness    Complete by:  As directed    Call MD for:  persistant nausea and vomiting    Complete by:  As directed    Call MD for:  redness, tenderness, or signs of infection (pain, swelling, redness, odor or green/yellow discharge around incision site)    Complete by:  As directed    Call MD for:  severe uncontrolled pain    Complete by:  As directed    Call MD for:  temperature >100.4    Complete by:  As directed    Diet - low sodium heart healthy    Complete by:  As directed    Increase activity slowly    Complete by:  As directed      Allergies as of 08/06/2016   No Known Allergies     Medication List  TAKE these medications   acetaminophen 325 MG tablet Commonly known as:  TYLENOL Take 2 tablets (650 mg total) by mouth every 6 (six) hours as needed for mild pain (or Fever >/= 101).   ADVAIR DISKUS IN Inhale 1 puff into the lungs 2 (two) times daily.   albuterol 108 (90 Base) MCG/ACT inhaler Commonly known as:  PROVENTIL HFA;VENTOLIN HFA Inhale 2 puffs into the lungs every 6 (six) hours as needed for wheezing or shortness of breath.   aspirin EC 81 MG tablet Take 81 mg by mouth daily.   cloNIDine 0.1 MG tablet Commonly known as:  CATAPRES Take 1 tablet (0.1 mg total) by mouth 3 (three) times daily.   dexamethasone 4 MG tablet Commonly known as:  DECADRON Take 2 tablets (8 mg total) by mouth  daily. Start the day after chemotherapy for 2 days. What changed:  Another medication with the same name was added. Make sure you understand how and when to take each.   dexamethasone 4 MG tablet Commonly known as:  DECADRON Take 2 tablets (8 mg total) by mouth daily. Start taking on:  08/07/2016 What changed:  You were already taking a medication with the same name, and this prescription was added. Make sure you understand how and when to take each.   fentaNYL 12 MCG/HR Commonly known as:  DURAGESIC - dosed mcg/hr Place 1 patch (12.5 mcg total) onto the skin every 3 (three) days.   finasteride 5 MG tablet Commonly known as:  PROSCAR TAKE 1 TABLET EVERY DAY   fluticasone 50 MCG/ACT nasal spray Commonly known as:  FLONASE Place 2 sprays into both nostrils daily. What changed:  when to take this  reasons to take this   gabapentin 300 MG capsule Commonly known as:  NEURONTIN Take 300-600 mg by mouth 2 (two) times daily. Pt takes one capsule in the morning and two at bedtime.   lidocaine-prilocaine cream Commonly known as:  EMLA Apply to affected area once   linagliptin 5 MG Tabs tablet Commonly known as:  TRADJENTA Take 1 tablet (5 mg total) by mouth daily.   LORazepam 0.5 MG tablet Commonly known as:  ATIVAN TAKE 1 TABLET BY MOUTH TWICE A DAY AS NEEDED   magic mouthwash w/lidocaine Soln USE 1 TEASPOONFUL BY 15MINS BEFORE MEALS AND AS NEEDED FOR DISCOMFORT,SWISH AND SWALLOW   meloxicam 7.5 MG tablet Commonly known as:  MOBIC TAKE 1 TABLET EVERY DAY FOR BACK PAIN What changed:  See the new instructions.   metoprolol succinate 50 MG 24 hr tablet Commonly known as:  TOPROL-XL Take 1 tablet (50 mg total) by mouth daily. Take with or immediately following a meal. What changed:  when to take this  additional instructions   ondansetron 8 MG tablet Commonly known as:  ZOFRAN Take 1 tablet (8 mg total) by mouth 2 (two) times daily as needed for refractory nausea /  vomiting. Start on day 3 after chemo.   pravastatin 40 MG tablet Commonly known as:  PRAVACHOL Take 40 mg by mouth every 3 (three) days. M-W-F   prochlorperazine 10 MG tablet Commonly known as:  COMPAZINE Take 1 tablet (10 mg total) by mouth every 6 (six) hours as needed (Nausea or vomiting).   tamsulosin 0.4 MG Caps capsule Commonly known as:  FLOMAX Take 0.4 mg by mouth daily after breakfast.   tiotropium 18 MCG inhalation capsule Commonly known as:  SPIRIVA Place 18 mcg into inhaler and inhale daily.   traZODone 50 MG tablet  Commonly known as:  DESYREL Take 1 tablet (50 mg total) by mouth at bedtime as needed for sleep.      Follow-up Information    [facility]. Go on 08/15/2016.   Specialty:  Oncology Why:  Appt May 11th at 945 to see Dr. and have chemo Contact information: [address] [id number] [place] [phone]         No Known Allergies  Consultations:  None   Procedures/Studies: Dg Chest 2 View  Result Date: 07/08/2016 CLINICAL DATA:  Currently undergoing radiation and chemotherapy for lung cancer. Now febrile. EXAM: CHEST  2 VIEW COMPARISON:  06/13/2016 FINDINGS: There is a left-sided port terminating in the low SVC. The lungs are clear. There is no pleural effusion. The pulmonary vasculature is normal. Heart size is normal. IMPRESSION: No consolidation or effusion. Electronically Signed   By: Andreas Newport M.D.   On: 07/08/2016 22:53        Subjective: Patient reports resolution of pain and swelling since admission.  Wants to discharge today.  Discharge Exam: Vitals:   08/05/16 1942 08/06/16 0415  BP: 116/62 (!) 111/59  Pulse: 94 90  Resp: 20 18  Temp: 97.5 F (36.4 C) 97.5 F (36.4 C)   Vitals:   08/05/16 1942 08/05/16 2039 08/06/16 0415 08/06/16 0831  BP: 116/62  (!) 111/59   Pulse: 94  90   Resp: 20  18   Temp: 97.5 F (36.4 C)  97.5 F (36.4 C)   TempSrc: Oral  Oral    SpO2: 98% 97% 96% 95%  Weight:   79.9 kg (176 lb 3.2 oz)   Height:        General: Pt is alert, awake, not in acute distress Cardiovascular: RRR, S1/S2 +, no rubs, no gallops Respiratory: CTA bilaterally, no wheezing, no rhonchi Abdominal: Soft, NT, ND, bowel sounds + Extremities: no edema, no cyanosis    The results of significant diagnostics from this hospitalization (including imaging, microbiology, ancillary and laboratory) are listed below for reference.     Microbiology: Recent Results (from the past 240 hour(s))  Culture, blood (routine x 2)     Status: None (Preliminary result)   Collection Time: 08/05/16  4:15 AM  Result Value Ref Range Status   Specimen Description PORTA CATH DRAWN BY RN  Final   Special Requests   Final    BOTTLES DRAWN AEROBIC AND ANAEROBIC Blood Culture adequate volume   Culture NO GROWTH 1 DAY  Final   Report Status PENDING  Incomplete  Culture, blood (routine x 2)     Status: None (Preliminary result)   Collection Time: 08/05/16  4:24 AM  Result Value Ref Range Status   Specimen Description BLOOD RIGHT ARM  Final   Special Requests   Final    BOTTLES DRAWN AEROBIC AND ANAEROBIC Blood Culture results may not be optimal due to an excessive volume of blood received in culture bottles   Culture NO GROWTH 1 DAY  Final   Report Status PENDING  Incomplete     Labs: BNP (last 3 results) No results for input(s): BNP in the last 8760 hours. Basic Metabolic Panel:  Recent Labs Lab 08/04/16 1257 08/05/16 0414 08/06/16 0601  NA 133* 133* 135  K 3.9 4.0 4.0  CL 96* 97* 103  CO2 '29 28 27  '$ GLUCOSE 172* 153* 150*  BUN 23* 23* 22*  CREATININE 0.80 0.82 0.69  CALCIUM 8.9 9.0 8.4*  MG  --   --  2.0  PHOS  --   --  3.5   Liver Function Tests:  Recent Labs Lab 08/05/16 0414 08/06/16 0601  AST 12* 13*  ALT 17 14*  ALKPHOS 69 58  BILITOT 0.6 0.3  PROT 6.7 5.3*  ALBUMIN 3.0* 2.4*   No results for input(s): LIPASE, AMYLASE in the last 168  hours. No results for input(s): AMMONIA in the last 168 hours. CBC:  Recent Labs Lab 08/04/16 1257 08/05/16 0414 08/06/16 0601  WBC 4.6 4.7 3.8*  NEUTROABS 4.1 4.0  --   HGB 11.1* 11.0* 9.1*  HCT 31.1* 31.2* 26.0*  MCV 94.2 94.8 94.5  PLT 246 232 229   Cardiac Enzymes:  Recent Labs Lab 08/05/16 0414  CKTOTAL 27*   BNP: Invalid input(s): POCBNP CBG:  Recent Labs Lab 08/05/16 0316 08/06/16 0857 08/06/16 1223  GLUCAP 175* 140* 163*   D-Dimer No results for input(s): DDIMER in the last 72 hours. Hgb A1c  Recent Labs  08/05/16 0442  HGBA1C 8.0*   Lipid Profile No results for input(s): CHOL, HDL, LDLCALC, TRIG, CHOLHDL, LDLDIRECT in the last 72 hours. Thyroid function studies  Recent Labs  08/05/16 0414  TSH 1.139   Anemia work up No results for input(s): VITAMINB12, FOLATE, FERRITIN, TIBC, IRON, RETICCTPCT in the last 72 hours. Urinalysis    Component Value Date/Time   COLORURINE YELLOW 08/05/2016 0403   APPEARANCEUR CLEAR 08/05/2016 0403   LABSPEC 1.020 08/05/2016 0403   PHURINE 6.0 08/05/2016 0403   GLUCOSEU NEGATIVE 08/05/2016 0403   HGBUR NEGATIVE 08/05/2016 0403   BILIRUBINUR NEGATIVE 08/05/2016 0403   KETONESUR 5 (A) 08/05/2016 0403   PROTEINUR 30 (A) 08/05/2016 0403   NITRITE NEGATIVE 08/05/2016 0403   LEUKOCYTESUR TRACE (A) 08/05/2016 0403   Sepsis Labs Invalid input(s): PROCALCITONIN,  WBC,  LACTICIDVEN Microbiology Recent Results (from the past 240 hour(s))  Culture, blood (routine x 2)     Status: None (Preliminary result)   Collection Time: 08/05/16  4:15 AM  Result Value Ref Range Status   Specimen Description PORTA CATH DRAWN BY RN  Final   Special Requests   Final    BOTTLES DRAWN AEROBIC AND ANAEROBIC Blood Culture adequate volume   Culture NO GROWTH 1 DAY  Final   Report Status PENDING  Incomplete  Culture, blood (routine x 2)     Status: None (Preliminary result)   Collection Time: 08/05/16  4:24 AM  Result Value Ref  Range Status   Specimen Description BLOOD RIGHT ARM  Final   Special Requests   Final    BOTTLES DRAWN AEROBIC AND ANAEROBIC Blood Culture results may not be optimal due to an excessive volume of blood received in culture bottles   Culture NO GROWTH 1 DAY  Final   Report Status PENDING  Incomplete     Time coordinating discharge: 35 minutes  SIGNED:   Loretha Stapler, MD  [facility] 08/06/2016, 3:01 PM Pager [phone] If 7PM-7AM, please contact night-coverage [email or web address] Password [email or web address]

## 2016-08-07 DIAGNOSIS — Z510 Encounter for antineoplastic radiation therapy: Secondary | ICD-10-CM | POA: Diagnosis not present

## 2016-08-07 DIAGNOSIS — C3401 Malignant neoplasm of right main bronchus: Secondary | ICD-10-CM | POA: Diagnosis not present

## 2016-08-07 LAB — HEMOGLOBIN A1C
Hgb A1c MFr Bld: 8.2 % — ABNORMAL HIGH (ref 4.8–5.6)
MEAN PLASMA GLUCOSE: 189 mg/dL

## 2016-08-08 ENCOUNTER — Other Ambulatory Visit: Payer: Self-pay | Admitting: Family Medicine

## 2016-08-08 DIAGNOSIS — C3401 Malignant neoplasm of right main bronchus: Secondary | ICD-10-CM | POA: Diagnosis not present

## 2016-08-08 DIAGNOSIS — Z510 Encounter for antineoplastic radiation therapy: Secondary | ICD-10-CM | POA: Diagnosis not present

## 2016-08-10 ENCOUNTER — Emergency Department (HOSPITAL_COMMUNITY): Payer: Medicare Other

## 2016-08-10 ENCOUNTER — Emergency Department (HOSPITAL_COMMUNITY)
Admission: EM | Admit: 2016-08-10 | Discharge: 2016-08-10 | Disposition: A | Discharge status: Home / Self Care | Payer: Medicare Other | Attending: Emergency Medicine | Admitting: Emergency Medicine

## 2016-08-10 DIAGNOSIS — M25532 Pain in left wrist: Secondary | ICD-10-CM | POA: Insufficient documentation

## 2016-08-10 DIAGNOSIS — F17210 Nicotine dependence, cigarettes, uncomplicated: Secondary | ICD-10-CM | POA: Insufficient documentation

## 2016-08-10 DIAGNOSIS — R42 Dizziness and giddiness: Secondary | ICD-10-CM | POA: Diagnosis not present

## 2016-08-10 DIAGNOSIS — W07XXXA Fall from chair, initial encounter: Secondary | ICD-10-CM | POA: Diagnosis not present

## 2016-08-10 DIAGNOSIS — J449 Chronic obstructive pulmonary disease, unspecified: Secondary | ICD-10-CM | POA: Insufficient documentation

## 2016-08-10 DIAGNOSIS — I110 Hypertensive heart disease with heart failure: Secondary | ICD-10-CM | POA: Insufficient documentation

## 2016-08-10 DIAGNOSIS — M1990 Unspecified osteoarthritis, unspecified site: Secondary | ICD-10-CM

## 2016-08-10 DIAGNOSIS — Y939 Activity, unspecified: Secondary | ICD-10-CM | POA: Insufficient documentation

## 2016-08-10 DIAGNOSIS — Z79899 Other long term (current) drug therapy: Secondary | ICD-10-CM | POA: Diagnosis not present

## 2016-08-10 DIAGNOSIS — R404 Transient alteration of awareness: Secondary | ICD-10-CM | POA: Diagnosis not present

## 2016-08-10 DIAGNOSIS — E119 Type 2 diabetes mellitus without complications: Secondary | ICD-10-CM | POA: Diagnosis not present

## 2016-08-10 DIAGNOSIS — M25531 Pain in right wrist: Secondary | ICD-10-CM | POA: Insufficient documentation

## 2016-08-10 DIAGNOSIS — R102 Pelvic and perineal pain: Secondary | ICD-10-CM | POA: Insufficient documentation

## 2016-08-10 DIAGNOSIS — Z7982 Long term (current) use of aspirin: Secondary | ICD-10-CM | POA: Insufficient documentation

## 2016-08-10 DIAGNOSIS — I509 Heart failure, unspecified: Secondary | ICD-10-CM | POA: Insufficient documentation

## 2016-08-10 DIAGNOSIS — Y929 Unspecified place or not applicable: Secondary | ICD-10-CM | POA: Insufficient documentation

## 2016-08-10 DIAGNOSIS — Y999 Unspecified external cause status: Secondary | ICD-10-CM | POA: Insufficient documentation

## 2016-08-10 DIAGNOSIS — S6991XA Unspecified injury of right wrist, hand and finger(s), initial encounter: Secondary | ICD-10-CM | POA: Diagnosis present

## 2016-08-10 DIAGNOSIS — S3993XA Unspecified injury of pelvis, initial encounter: Secondary | ICD-10-CM | POA: Diagnosis not present

## 2016-08-10 DIAGNOSIS — S6992XA Unspecified injury of left wrist, hand and finger(s), initial encounter: Secondary | ICD-10-CM | POA: Diagnosis not present

## 2016-08-10 LAB — COMPREHENSIVE METABOLIC PANEL
ALBUMIN: 2.9 g/dL — AB (ref 3.5–5.0)
ALT: 20 U/L (ref 17–63)
ANION GAP: 8 (ref 5–15)
AST: 17 U/L (ref 15–41)
Alkaline Phosphatase: 70 U/L (ref 38–126)
BUN: 20 mg/dL (ref 6–20)
CHLORIDE: 102 mmol/L (ref 101–111)
CO2: 28 mmol/L (ref 22–32)
Calcium: 9 mg/dL (ref 8.9–10.3)
Creatinine, Ser: 0.82 mg/dL (ref 0.61–1.24)
GFR calc Af Amer: >60 mL/min (ref >60)
GFR calc non Af Amer: >60 mL/min (ref >60)
GLUCOSE: 165 mg/dL — AB (ref 65–99)
POTASSIUM: 3.6 mmol/L (ref 3.5–5.1)
Sodium: 138 mmol/L (ref 135–145)
Total Bilirubin: 0.6 mg/dL (ref 0.3–1.2)
Total Protein: 6.4 g/dL — ABNORMAL LOW (ref 6.5–8.1)

## 2016-08-10 LAB — SEDIMENTATION RATE: SED RATE: 118 mm/h — AB (ref 0–16)

## 2016-08-10 LAB — CBC
HCT: 30 % — ABNORMAL LOW (ref 39.0–52.0)
Hemoglobin: 10.4 g/dL — ABNORMAL LOW (ref 13.0–17.0)
MCH: 33.4 pg (ref 26.0–34.0)
MCHC: 34.7 g/dL (ref 30.0–36.0)
MCV: 96.5 fL (ref 78.0–100.0)
PLATELETS: 232 10*3/uL (ref 150–400)
RBC: 3.11 MIL/uL — ABNORMAL LOW (ref 4.22–5.81)
RDW: 17 % — AB (ref 11.5–15.5)
WBC: 8.1 10*3/uL (ref 4.0–10.5)

## 2016-08-10 MED ORDER — KETOROLAC TROMETHAMINE 30 MG/ML IJ SOLN
30.0000 mg | Freq: Once | INTRAMUSCULAR | Status: AC
  Administered 2016-08-10: 30 mg via INTRAVENOUS
  Filled 2016-08-10: qty 1

## 2016-08-10 MED ORDER — HEPARIN SOD (PORK) LOCK FLUSH 100 UNIT/ML IV SOLN
INTRAVENOUS | Status: AC
  Administered 2016-08-10: 500 [IU]
  Filled 2016-08-10: qty 5

## 2016-08-10 MED ORDER — HYDROCODONE-ACETAMINOPHEN 5-325 MG PO TABS: 2.0000 | ORAL_TABLET | ORAL | 0 refills | Status: DC | PRN

## 2016-08-10 MED ORDER — DEXAMETHASONE SODIUM PHOSPHATE 4 MG/ML IJ SOLN
10.0000 mg | Freq: Once | INTRAMUSCULAR | Status: AC
  Administered 2016-08-10: 10 mg via INTRAVENOUS
  Filled 2016-08-10: qty 3

## 2016-08-10 MED ORDER — IBUPROFEN 600 MG PO TABS: 600.0000 mg | ORAL_TABLET | Freq: Four times a day (QID) | ORAL | 0 refills | Status: DC | PRN

## 2016-08-10 NOTE — Discharge Instructions (Signed)
You will likely have pain for a couple of days You MUST talk to your cancer doctors about your joint pain / it may be from the chemotherapy. If you have fevers return to the ER Vicodin for severe pain.  Motrin 3 times daily

## 2016-08-10 NOTE — ED Notes (Signed)
Dr Wyvonnia Dusky in to see patient.  Patient sleeping at this time.  Did not disturb patient at this time

## 2016-08-10 NOTE — ED Provider Notes (Signed)
[facility] DEPT Provider Note   CSN: [id number] Arrival date & time: 08/10/16  0519     History   Chief Complaint Chief Complaint  Patient presents with  . Fall    HPI John [name] is a 71 y.o. male.  HPI  The patient is a 71 year old male, he has a history of non-small cell lung cancer of the lung, he has had chemotherapy recently and has 1 more round of chemotherapy to go, also has been getting radiation. He was admitted approximately 5 days ago with diffuse joint pains thought to be arthritic in nature, likely related to chemotherapy, when he was admitted to the hospital he was given Decadron and colchicine and within 24 hours had improved significantly and wanted to be discharged. He states that yesterday he was mowing his lawn, he went to sit in a chair, he turned around to look at his CAT, fell forward out of the chair landing on his bilateral hands and his knee and developed acute onset of bilateral wrist pain as well as left groin pain which is almost exactly the presentation that occurred 6 days ago when he was seen in the emergency department. He states that he is having difficulty using either hand or wrist, he is having difficulty walking because of the pain, he called paramedics for transport secondary to his level of pain. He reports that the person that he lives with at her to go on to work and thus he did not have his caregiver in the house. He denies any injuries, denies fever, shortness of breath, chest pain, abdominal pain.  Past Medical History:  Diagnosis Date  . Adenocarcinoma of lung, right ([place]) 04/18/2016  . Anxiety   . Arthritis   . CHF (congestive heart failure) ([place])   . COPD (chronic obstructive pulmonary disease) ([place])   . Depression   . Diabetes mellitus without complication (HCC)    no meds  . Dyspnea   . History of kidney stones   . Hyperlipidemia   . Hypertension   . Macular degeneration   . Neuropathy   . Non-small cell lung cancer, right  ([place]) 04/18/2016  . Prostatitis   . Pulmonary nodule, left 07/16/2016  . Sleep apnea    cpap    Patient Active Problem List   Diagnosis Date Noted  . Joint pain 08/05/2016  . Bilateral hand swelling 08/05/2016  . Hyponatremia 08/05/2016  . Pulmonary nodule, left 07/16/2016  . CHF (congestive heart failure) ([place]) 07/09/2016  . DM (diabetes mellitus) ([place]) 07/09/2016  . Non-small cell lung cancer, right ([place]) 04/18/2016  . HTN (hypertension) 02/01/2016  . COPD (chronic obstructive pulmonary disease) ([place]) 02/01/2016  . BPH (benign prostatic hyperplasia) 02/01/2016    Past Surgical History:  Procedure Laterality Date  . CATARACT EXTRACTION, BILATERAL Bilateral   . NO PAST SURGERIES    . PORTACATH PLACEMENT Left 06/13/2016   Procedure: INSERTION PORT-A-CATH;  Surgeon: Aviva Signs, MD;  Location: [facility];  Service: General;  Laterality: Left;  Marland Kitchen VIDEO BRONCHOSCOPY WITH ENDOBRONCHIAL NAVIGATION N/A 05/28/2016   Procedure: VIDEO BRONCHOSCOPY WITH ENDOBRONCHIAL NAVIGATION;  Surgeon: [name], MD;  Location: [facility];  Service: Thoracic;  Laterality: N/A;  . VIDEO BRONCHOSCOPY WITH ENDOBRONCHIAL ULTRASOUND N/A 05/28/2016   Procedure: VIDEO BRONCHOSCOPY WITH ENDOBRONCHIAL ULTRASOUND;  Surgeon: [name], MD;  Location: [facility];  Service: Thoracic;  Laterality: N/A;       Home Medications    Prior to Admission medications   Medication Sig Start Date  End Date Taking? Authorizing Provider  acetaminophen (TYLENOL) 325 MG tablet Take 2 tablets (650 mg total) by mouth every 6 (six) hours as needed for mild pain (or Fever >/= 101). 08/06/16  Yes Eber Jones, MD  albuterol (PROVENTIL HFA;VENTOLIN HFA) 108 (90 Base) MCG/ACT inhaler Inhale 2 puffs into the lungs every 6 (six) hours as needed for wheezing or shortness of breath. 06/05/16  Yes Baird Cancer, PA-C  aspirin EC 81 MG tablet Take 81 mg by mouth daily.   Yes [provider]  cloNIDine (CATAPRES) 0.1 MG  tablet Take 1 tablet (0.1 mg total) by mouth 3 (three) times daily. 05/19/16  Yes [name], MD  dexamethasone (DECADRON) 4 MG tablet Take 2 tablets (8 mg total) by mouth daily. 08/07/16 08/10/16 Yes Eber Jones, MD  fentaNYL (DURAGESIC - DOSED MCG/HR) 12 MCG/HR Place 1 patch (12.5 mcg total) onto the skin every 3 (three) days. 07/28/16  Yes Baird Cancer, PA-C  finasteride (PROSCAR) 5 MG tablet TAKE 1 TABLET EVERY DAY 06/03/16  Yes [name], MD  fluticasone [facility]) 50 MCG/ACT nasal spray Place 2 sprays into both nostrils daily. Patient taking differently: Place 2 sprays into both nostrils at bedtime as needed for allergies.  12/07/15  Yes Wardell Honour, MD  Fluticasone-Salmeterol (ADVAIR DISKUS IN) Inhale 1 puff into the lungs 2 (two) times daily.   Yes [provider]  gabapentin (NEURONTIN) 300 MG capsule Take 300-600 mg by mouth 2 (two) times daily. Pt takes one capsule in the morning and two at bedtime.   Yes [provider]  lidocaine-prilocaine (EMLA) cream Apply to affected area once 06/10/16  Yes Twana First, MD  linagliptin (TRADJENTA) 5 MG TABS tablet Take 1 tablet (5 mg total) by mouth daily. 07/14/16  Yes [name], MD  LORazepam (ATIVAN) 0.5 MG tablet TAKE 1 TABLET BY MOUTH TWICE A DAY AS NEEDED 07/03/16  Yes [name], MD  magic mouthwash w/lidocaine SOLN USE 1 TEASPOONFUL BY 15MINS BEFORE MEALS AND AS NEEDED FOR [place] AND SWALLOW 07/08/16  Yes [provider]  metoprolol succinate (TOPROL-XL) 50 MG 24 hr tablet Take 1 tablet (50 mg total) by mouth daily. Take with or immediately following a meal. Patient taking differently: Take 50 mg by mouth at bedtime. Take with or immediately following a meal. 05/13/16  Yes [name], MD  omeprazole (PRILOSEC) 20 MG capsule Take 20 mg by mouth daily.  08/09/16  Yes [provider]  ondansetron (ZOFRAN) 8 MG tablet Take 1 tablet (8 mg total) by mouth 2  (two) times daily as needed for refractory nausea / vomiting. Start on day 3 after chemo. 06/10/16  Yes Twana First, MD  pravastatin (PRAVACHOL) 40 MG tablet Take 40 mg by mouth every 3 (three) days. M-W-F 11/12/15  Yes [provider]  prochlorperazine (COMPAZINE) 10 MG tablet Take 1 tablet (10 mg total) by mouth every 6 (six) hours as needed (Nausea or vomiting). 06/10/16  Yes Twana First, MD  tamsulosin (FLOMAX) 0.4 MG CAPS capsule Take 0.4 mg by mouth daily after breakfast.   Yes [provider]  traZODone (DESYREL) 50 MG tablet Take 1 tablet (50 mg total) by mouth at bedtime as needed for sleep. 07/10/16  Yes Kathie Dike, MD  HYDROcodone-acetaminophen (NORCO/VICODIN) 5-325 MG tablet Take 2 tablets by mouth every 4 (four) hours as needed. 08/10/16   Noemi Chapel, MD  ibuprofen (ADVIL,MOTRIN) 600 MG tablet Take 1 tablet (600 mg  total) by mouth every 6 (six) hours as needed. 08/10/16   Noemi Chapel, MD    Family History Family History  Problem Relation Age of Onset  . Hypertension Mother   . Diabetes Father   . Heart disease Father   . Hypertension Sister     Social History Social History  Substance Use Topics  . Smoking status: Current Every Day Smoker    Packs/day: 0.50    Years: 55.00    Types: Cigarettes    Start date: 03/11/1961  . Smokeless tobacco: Never Used  . Alcohol use No     Allergies   Patient has no known allergies.   Review of Systems Review of Systems  All other systems reviewed and are negative.    Physical Exam Updated Vital Signs BP 127/62   Pulse (!) 105   Temp 98.4 F (36.9 C) (Oral)   Resp 18   Ht '5\' 6"'$  (1.676 m)   Wt 175 lb (79.4 kg)   SpO2 94%   BMI 28.25 kg/m   Physical Exam  Constitutional: He appears well-developed and well-nourished. No distress.  HENT:  Head: Normocephalic and atraumatic.  Mouth/Throat: Oropharynx is clear and moist. No oropharyngeal exudate.  Eyes: Conjunctivae and EOM are normal. Pupils are  equal, round, and reactive to light. Right eye exhibits no discharge. Left eye exhibits no discharge. No scleral icterus.  Neck: Normal range of motion. Neck supple. No JVD present. No thyromegaly present.  Cardiovascular: Normal rate, regular rhythm, normal heart sounds and intact distal pulses.  Exam reveals no gallop and no friction rub.   No murmur heard. Pulmonary/Chest: Effort normal and breath sounds normal. No respiratory distress. He has no wheezes. He has no rales.  Abdominal: Soft. Bowel sounds are normal. He exhibits no distension and no mass. There is no tenderness.  Musculoskeletal: He exhibits tenderness. He exhibits no edema.  There is no deformities to any of the joints however he does have mildly swollen bilateral wrists with significant decreased range of motion of the wrist. He is unable to make a full grip on his bilateral hands, he is able to straight leg raise bilaterally but has some pain in the left groin area with doing this. There is mild tenderness to palpation over the left groin especially the medial proximal musculature as well as over the inguinal ligament. He does have normal-appearing leg joints including hips knees and ankles. He has good range of motion of the bilateral knees and only complains of pain in the groin with doing this.  Lymphadenopathy:    He has no cervical adenopathy.  Neurological: He is alert. Coordination normal.  Normal strength, normal cranial nerves III through XII grossly, normal level of alertness, speaks in full sentences  Skin: Skin is warm and dry. No rash noted. No erythema.  No redness or rash over the skin or over the joints  Psychiatric: He has a normal mood and affect. His behavior is normal.  Nursing note and vitals reviewed.    ED Treatments / Results  Labs (all labs ordered are listed, but only abnormal results are displayed) Labs Reviewed  SEDIMENTATION RATE - Abnormal; Notable for the following:       Result Value   Sed  Rate 118 (*)    All other components within normal limits  CBC - Abnormal; Notable for the following:    RBC 3.11 (*)    Hemoglobin 10.4 (*)    HCT 30.0 (*)    RDW 17.0 (*)  All other components within normal limits  COMPREHENSIVE METABOLIC PANEL - Abnormal; Notable for the following:    Glucose, Bld 165 (*)    Total Protein 6.4 (*)    Albumin 2.9 (*)    All other components within normal limits    E Radiology Dg Pelvis 1-2 Views  Result Date: 08/10/2016 CLINICAL DATA:  Pelvic pain after fall yesterday. EXAM: PELVIS - 1-2 VIEW COMPARISON:  None. FINDINGS: There is no evidence of pelvic fracture or diastasis. No pelvic bone lesions are seen. IMPRESSION: Normal pelvis. Electronically Signed   By: Marijo Conception, M.D.   On: 08/10/2016 08:11   Dg Wrist Complete Left  Result Date: 08/10/2016 CLINICAL DATA:  Left wrist pain after fall yesterday. EXAM: LEFT WRIST - COMPLETE 3+ VIEW COMPARISON:  None. FINDINGS: There is no evidence of fracture or dislocation. Mild degenerative changes seen involving the first carpometacarpal joint. Soft tissues are unremarkable. IMPRESSION: Mild osteoarthritis of the first carpometacarpal joint. No acute abnormality seen in the left wrist. Electronically Signed   By: Marijo Conception, M.D.   On: 08/10/2016 08:07   Dg Wrist Complete Right  Result Date: 08/10/2016 CLINICAL DATA:  Right wrist pain after fall yesterday. EXAM: RIGHT WRIST - COMPLETE 3+ VIEW COMPARISON:  None. FINDINGS: There is no evidence of fracture or dislocation. There is no evidence of arthropathy or other focal bone abnormality. Soft tissues are unremarkable. IMPRESSION: No acute abnormality seen in the right wrist. Electronically Signed   By: Marijo Conception, M.D.   On: 08/10/2016 08:10    Procedures Procedures (including critical care time)  Medications Ordered in ED Medications  dexamethasone (DECADRON) injection 10 mg (10 mg Intravenous Given 08/10/16 0824)  ketorolac (TORADOL) 30 MG/ML  injection 30 mg (30 mg Intravenous Given 08/10/16 0824)     Initial Impression / Assessment and Plan / ED Course  I have reviewed the triage vital signs and the nursing notes.  Pertinent labs & imaging results that were available during my care of the patient were reviewed by me and considered in my medical decision making (see chart for details).     The patient appears to have an inflammatory arthritis, it is polyarthritis, it is likely in some way related to his chemotherapy or underlying disease, they do not look infected, we'll make sure that he has not fractured them with x-rays, he will get some pain medication in the form of an anti-inflammatory and a steroid  Pt has evelavated sed rate as expected - will need to have steroid / NSAIDs Imaging negative Pt given results 6 tabs of vicodin given for next 2 days Pt expressed understanding.  Final Clinical Impressions(s) / ED Diagnoses   Final diagnoses:  Inflammatory arthritis    New Prescriptions New Prescriptions   HYDROCODONE-ACETAMINOPHEN (NORCO/VICODIN) 5-325 MG TABLET    Take 2 tablets by mouth every 4 (four) hours as needed.   IBUPROFEN (ADVIL,MOTRIN) 600 MG TABLET    Take 1 tablet (600 mg total) by mouth every 6 (six) hours as needed.     Noemi Chapel, MD 08/10/16 1030

## 2016-08-10 NOTE — ED Triage Notes (Signed)
Per EMS, male family member at home stated that pt fell yesterday.  Pt was able to mow and weedeat yard after fall.  Called this morning for severe pain.  Pt has on Fentanyl patch and states he  Took his hydrocodone at 0330

## 2016-08-10 NOTE — ED Provider Notes (Signed)
MSE was initiated and I personally evaluated the patient and placed orders (if any) at  6:44 AM on Aug 10, 2016.  Patient presents via EMS after fall from chair at 130 pm yesterday. States he fell forward onto his bilateral wrists. He has worsening bilateral wrist pain as well as pain between his thighs. Denies hitting head or losing consciousness. Patient with  71 y.o. male with medical history significant of NSCLC of Right Lung on Chemo with Carbo/Taxol, COPD, CHDF, Depression, Arthritis, Anxiety, HTN, HLD, Sleep Apnea.  Patient recently admitted on May 1 bilateral wrist pain and thigh pain. He went home the next day steroid course. He denies fever. He states this pain is in the same location but worse after his fall.  Patient is nontoxic. There is mild swelling of left wrist with tenderness to palpation reduced range of motion. No overlying erythema. TTP R wrist with reduced ROM.  No erythema Pain with attempted ROM hips.  appears stable so that the remainder of the MSE may be completed by another provider.   Ezequiel Essex, MD 08/10/16 [phone]

## 2016-08-11 DIAGNOSIS — C3411 Malignant neoplasm of upper lobe, right bronchus or lung: Secondary | ICD-10-CM | POA: Diagnosis not present

## 2016-08-11 DIAGNOSIS — Z510 Encounter for antineoplastic radiation therapy: Secondary | ICD-10-CM | POA: Diagnosis not present

## 2016-08-11 DIAGNOSIS — C3401 Malignant neoplasm of right main bronchus: Secondary | ICD-10-CM | POA: Diagnosis not present

## 2016-08-11 LAB — CULTURE, BLOOD (ROUTINE X 2)
CULTURE: NO GROWTH
Culture: NO GROWTH
SPECIAL REQUESTS: ADEQUATE

## 2016-08-12 DIAGNOSIS — Z510 Encounter for antineoplastic radiation therapy: Secondary | ICD-10-CM | POA: Diagnosis not present

## 2016-08-12 DIAGNOSIS — C3401 Malignant neoplasm of right main bronchus: Secondary | ICD-10-CM | POA: Diagnosis not present

## 2016-08-13 DIAGNOSIS — Z510 Encounter for antineoplastic radiation therapy: Secondary | ICD-10-CM | POA: Diagnosis not present

## 2016-08-13 DIAGNOSIS — C3401 Malignant neoplasm of right main bronchus: Secondary | ICD-10-CM | POA: Diagnosis not present

## 2016-08-14 ENCOUNTER — Ambulatory Visit: Payer: Medicare Other | Admitting: Family Medicine

## 2016-08-14 DIAGNOSIS — Z510 Encounter for antineoplastic radiation therapy: Secondary | ICD-10-CM | POA: Diagnosis not present

## 2016-08-14 DIAGNOSIS — C3401 Malignant neoplasm of right main bronchus: Secondary | ICD-10-CM | POA: Diagnosis not present

## 2016-08-15 ENCOUNTER — Encounter (HOSPITAL_COMMUNITY): Payer: Self-pay

## 2016-08-15 ENCOUNTER — Encounter (HOSPITAL_COMMUNITY): Payer: Medicare Other | Attending: Oncology

## 2016-08-15 ENCOUNTER — Encounter (HOSPITAL_BASED_OUTPATIENT_CLINIC_OR_DEPARTMENT_OTHER): Payer: Medicare Other | Admitting: Oncology

## 2016-08-15 VITALS — BP 135/73 | HR 97 | Temp 98.2°F | Resp 18 | Wt 176.0 lb

## 2016-08-15 DIAGNOSIS — R300 Dysuria: Secondary | ICD-10-CM | POA: Diagnosis not present

## 2016-08-15 DIAGNOSIS — R739 Hyperglycemia, unspecified: Secondary | ICD-10-CM | POA: Insufficient documentation

## 2016-08-15 DIAGNOSIS — Z510 Encounter for antineoplastic radiation therapy: Secondary | ICD-10-CM | POA: Diagnosis not present

## 2016-08-15 DIAGNOSIS — C3401 Malignant neoplasm of right main bronchus: Secondary | ICD-10-CM | POA: Diagnosis not present

## 2016-08-15 DIAGNOSIS — R0602 Shortness of breath: Secondary | ICD-10-CM | POA: Insufficient documentation

## 2016-08-15 DIAGNOSIS — C3491 Malignant neoplasm of unspecified part of right bronchus or lung: Secondary | ICD-10-CM | POA: Insufficient documentation

## 2016-08-15 DIAGNOSIS — Z5111 Encounter for antineoplastic chemotherapy: Secondary | ICD-10-CM | POA: Insufficient documentation

## 2016-08-15 LAB — CBC WITH DIFFERENTIAL/PLATELET
BASOS ABS: 0 10*3/uL (ref 0.0–0.1)
BASOS PCT: 0 %
EOS ABS: 0 10*3/uL (ref 0.0–0.7)
EOS PCT: 0 %
HCT: 30 % — ABNORMAL LOW (ref 39.0–52.0)
Hemoglobin: 10.2 g/dL — ABNORMAL LOW (ref 13.0–17.0)
Lymphocytes Relative: 5 %
Lymphs Abs: 0.4 10*3/uL — ABNORMAL LOW (ref 0.7–4.0)
MCH: 33.4 pg (ref 26.0–34.0)
MCHC: 34 g/dL (ref 30.0–36.0)
MCV: 98.4 fL (ref 78.0–100.0)
Monocytes Absolute: 0.6 10*3/uL (ref 0.1–1.0)
Monocytes Relative: 8 %
Neutro Abs: 6.4 10*3/uL (ref 1.7–7.7)
Neutrophils Relative %: 87 %
PLATELETS: 247 10*3/uL (ref 150–400)
RBC: 3.05 MIL/uL — AB (ref 4.22–5.81)
RDW: 17.4 % — ABNORMAL HIGH (ref 11.5–15.5)
WBC: 7.4 10*3/uL (ref 4.0–10.5)

## 2016-08-15 LAB — COMPREHENSIVE METABOLIC PANEL
ALBUMIN: 3 g/dL — AB (ref 3.5–5.0)
ALT: 26 U/L (ref 17–63)
AST: 17 U/L (ref 15–41)
Alkaline Phosphatase: 79 U/L (ref 38–126)
Anion gap: 8 (ref 5–15)
BUN: 26 mg/dL — ABNORMAL HIGH (ref 6–20)
CHLORIDE: 98 mmol/L — AB (ref 101–111)
CO2: 29 mmol/L (ref 22–32)
CREATININE: 0.76 mg/dL (ref 0.61–1.24)
Calcium: 8.7 mg/dL — ABNORMAL LOW (ref 8.9–10.3)
GFR calc non Af Amer: >60 mL/min (ref >60)
Glucose, Bld: 161 mg/dL — ABNORMAL HIGH (ref 65–99)
Potassium: 3.8 mmol/L (ref 3.5–5.1)
SODIUM: 135 mmol/L (ref 135–145)
Total Bilirubin: 0.2 mg/dL — ABNORMAL LOW (ref 0.3–1.2)
Total Protein: 6.3 g/dL — ABNORMAL LOW (ref 6.5–8.1)

## 2016-08-15 LAB — MAGNESIUM: Magnesium: 2 mg/dL (ref 1.7–2.4)

## 2016-08-15 MED ORDER — PALONOSETRON HCL INJECTION 0.25 MG/5ML
0.2500 mg | Freq: Once | INTRAVENOUS | Status: AC
  Administered 2016-08-15: 0.25 mg via INTRAVENOUS
  Filled 2016-08-15: qty 5

## 2016-08-15 MED ORDER — SODIUM CHLORIDE 0.9 % IV SOLN
Freq: Once | INTRAVENOUS | Status: AC
  Administered 2016-08-15: 11:00:00 via INTRAVENOUS

## 2016-08-15 MED ORDER — FAMOTIDINE IN NACL 20-0.9 MG/50ML-% IV SOLN
20.0000 mg | Freq: Once | INTRAVENOUS | Status: AC
  Administered 2016-08-15: 20 mg via INTRAVENOUS
  Filled 2016-08-15: qty 50

## 2016-08-15 MED ORDER — HEPARIN SOD (PORK) LOCK FLUSH 100 UNIT/ML IV SOLN
500.0000 [IU] | Freq: Once | INTRAVENOUS | Status: AC | PRN
  Administered 2016-08-15: 500 [IU]
  Filled 2016-08-15: qty 5

## 2016-08-15 MED ORDER — PACLITAXEL CHEMO INJECTION 300 MG/50ML
45.0000 mg/m2 | Freq: Once | INTRAVENOUS | Status: AC
  Administered 2016-08-15: 90 mg via INTRAVENOUS
  Filled 2016-08-15: qty 15

## 2016-08-15 MED ORDER — DIPHENHYDRAMINE HCL 50 MG/ML IJ SOLN
25.0000 mg | Freq: Once | INTRAMUSCULAR | Status: AC
  Administered 2016-08-15: 25 mg via INTRAVENOUS
  Filled 2016-08-15: qty 1

## 2016-08-15 MED ORDER — SODIUM CHLORIDE 0.9 % IV SOLN
20.0000 mg | Freq: Once | INTRAVENOUS | Status: AC
  Administered 2016-08-15: 20 mg via INTRAVENOUS
  Filled 2016-08-15: qty 2

## 2016-08-15 MED ORDER — CARBOPLATIN CHEMO INJECTION 450 MG/45ML
215.8000 mg | Freq: Once | INTRAVENOUS | Status: AC
  Administered 2016-08-15: 220 mg via INTRAVENOUS
  Filled 2016-08-15: qty 22

## 2016-08-15 NOTE — Patient Instructions (Signed)
[facility] Discharge Instructions for Patients Receiving Chemotherapy   Beginning January 23rd 2017 lab work for the Cancer Center will be done in the  Main lab at [facility] on 1st floor. If you have a lab appointment with the Cancer Center please come in thru the  Main Entrance and check in at the main information desk   Today you received the following chemotherapy agents:  Taxol and carboplatin  If you develop nausea and vomiting, or diarrhea that is not controlled by your medication, call the clinic.  The clinic phone number is [phone]. Office hours are Monday-Friday 8:30am-5:00pm.  BELOW ARE SYMPTOMS THAT SHOULD BE REPORTED IMMEDIATELY:  *FEVER GREATER THAN 101.0 F  *CHILLS WITH OR WITHOUT FEVER  NAUSEA AND VOMITING THAT IS NOT CONTROLLED WITH YOUR NAUSEA MEDICATION  *UNUSUAL SHORTNESS OF BREATH  *UNUSUAL BRUISING OR BLEEDING  TENDERNESS IN MOUTH AND THROAT WITH OR WITHOUT PRESENCE OF ULCERS  *URINARY PROBLEMS  *BOWEL PROBLEMS  UNUSUAL RASH Items with * indicate a potential emergency and should be followed up as soon as possible. If you have an emergency after office hours please contact your primary care physician or go to the nearest emergency department.  Please call the clinic during office hours if you have any questions or concerns.   You may also contact the Patient Navigator at [phone] should you have any questions or need assistance in obtaining follow up care.      Resources For Cancer Patients and their Caregivers ? [employer]: Can assist with transportation, wigs, general needs, runs Look Good Feel Better.        1-888-227-6333 ? Cancer Care: Provides financial assistance, online support groups, medication/co-pay assistance.  1-800-813-HOPE (4673) ? [facility] Assists [place] cancer patients and their families through emotional , educational and financial support.   336-427-4357 ? [place] DSS Where to apply for food stamps, Medicaid and utility assistance. 336-342-1394 ? [facility]: Transportation to medical appointments. 336-347-2287 ? Social Security Administration: May apply for disability if have a Stage IV cancer. 336-342-7796 1-800-772-1213 ? [place] [facility]ging, Disability and Transit Services: Assists with nutrition, care and transit needs. 336-349-2343         

## 2016-08-15 NOTE — Progress Notes (Signed)
Tolerated tx w/o adverse reaction.  Alert, in no distress.  VSS.  Discharged ambulatory in c/o girlfriend.

## 2016-08-15 NOTE — Progress Notes (Signed)
[facility] Hematology/Oncology Progress Note  Name: John [name]      MRN: [id number]   Date: 08/15/2016 Time:10:05 AM   REFERRING PHYSICIAN:  [name], MD (Primary Care Provider)  REASON FOR CONSULT:  Pulmonary lesion(s)   DIAGNOSIS:  Multifocal RUL pulmonary lesions, 1.7 cm and 2.0 cm with RIGHT hilar and paratracheal lymphadenopathy with a LUL pulmonary lesion with low hypermetabolic activity measuring 1.1 cm.  HISTORY OF PRESENT ILLNESS:   John [name] is a 71 y.o. male returns for follow up of pulmonary masses. He is accompanied by his wife today.  The patient reports she was in the hospital on 08/10/16 with inflammatory arthritis following a fall. He reports he could not walk, "I couldn't do nothing." The patient was taken to the ED and subsequently discharged home. He reports he is feeling much better now.  The patient reports John Crigler, PA previously instructed him not to be alone, and he requests a note for social services stating he should not be left alone so that he can get assistance at home.  He denies chest pain, shortness of breath, or abdominal pain. He reports he continues to smoke 1/2 ppd.The patient reports his last RT is next Wednesday.   Review of Systems  Constitutional: Positive for malaise/fatigue.  HENT:       Tongue soreness Dysphagia   Eyes: Positive for blurred vision (left) and redness (left).  Respiratory: Negative.  Negative for shortness of breath.   Cardiovascular: Negative.  Negative for chest pain.  Gastrointestinal: Negative.  Negative for abdominal pain.  Genitourinary: Negative.   Musculoskeletal: Negative.   Skin: Negative.   Neurological: Negative.   Endo/Heme/Allergies: Negative.   Psychiatric/Behavioral: Positive for substance abuse (1/2 ppd cigarettes). The patient has insomnia.   All other systems reviewed and are negative. 14 point review of systems was performed and is negative except as detailed under  history of present illness and above  PAST MEDICAL HISTORY:   Past Medical History:  Diagnosis Date  . Adenocarcinoma of lung, right ([place]) 04/18/2016  . Anxiety   . Arthritis   . CHF (congestive heart failure) ([place])   . COPD (chronic obstructive pulmonary disease) ([place])   . Depression   . Diabetes mellitus without complication (HCC)    no meds  . Dyspnea   . History of kidney stones   . Hyperlipidemia   . Hypertension   . Macular degeneration   . Neuropathy   . Non-small cell lung cancer, right ([place]) 04/18/2016  . Prostatitis   . Pulmonary nodule, left 07/16/2016  . Sleep apnea    cpap    ALLERGIES: No Known Allergies    MEDICATIONS: I have reviewed the patient's current medications.    Current Outpatient Prescriptions on [name] Prior to Visit  Medication Sig Dispense Refill  . acetaminophen (TYLENOL) 325 MG tablet Take 2 tablets (650 mg total) by mouth every 6 (six) hours as needed for mild pain (or Fever >/= 101).    Marland Kitchen albuterol (PROVENTIL HFA;VENTOLIN HFA) 108 (90 Base) MCG/ACT inhaler Inhale 2 puffs into the lungs every 6 (six) hours as needed for wheezing or shortness of breath. 1 Inhaler 2  . aspirin EC 81 MG tablet Take 81 mg by mouth daily.    . cloNIDine (CATAPRES) 0.1 MG tablet Take 1 tablet (0.1 mg total) by mouth 3 (three) times daily. 90 tablet 3  . fentaNYL (DURAGESIC - DOSED MCG/HR) 12 MCG/HR Place 1 patch (12.5 mcg total) onto the  skin every 3 (three) days. 10 patch 0  . finasteride (PROSCAR) 5 MG tablet TAKE 1 TABLET EVERY DAY 30 tablet 11  . fluticasone (FLONASE) 50 MCG/ACT nasal spray Place 2 sprays into both nostrils daily. (Patient taking differently: Place 2 sprays into both nostrils at bedtime as needed for allergies. ) 16 g 6  . Fluticasone-Salmeterol (ADVAIR DISKUS IN) Inhale 1 puff into the lungs 2 (two) times daily.    Marland Kitchen gabapentin (NEURONTIN) 300 MG capsule Take 300-600 mg by mouth 2 (two) times daily. Pt takes one capsule in the morning and two at  bedtime.    Marland Kitchen HYDROcodone-acetaminophen (NORCO/VICODIN) 5-325 MG tablet Take 2 tablets by mouth every 4 (four) hours as needed. 6 tablet 0  . ibuprofen (ADVIL,MOTRIN) 600 MG tablet Take 1 tablet (600 mg total) by mouth every 6 (six) hours as needed. 30 tablet 0  . lidocaine-prilocaine (EMLA) cream Apply to affected area once 30 g 3  . linagliptin (TRADJENTA) 5 MG TABS tablet Take 1 tablet (5 mg total) by mouth daily. 30 tablet 1  . LORazepam (ATIVAN) 0.5 MG tablet TAKE 1 TABLET BY MOUTH TWICE A DAY AS NEEDED 30 tablet 1  . magic mouthwash w/lidocaine SOLN USE 1 TEASPOONFUL BY 15MINS BEFORE MEALS AND AS NEEDED FOR DISCOMFORT,SWISH AND SWALLOW  1  . metoprolol succinate (TOPROL-XL) 50 MG 24 hr tablet Take 1 tablet (50 mg total) by mouth daily. Take with or immediately following a meal. (Patient taking differently: Take 50 mg by mouth at bedtime. Take with or immediately following a meal.) 90 tablet 3  . omeprazole (PRILOSEC) 20 MG capsule Take 20 mg by mouth daily.     . ondansetron (ZOFRAN) 8 MG tablet Take 1 tablet (8 mg total) by mouth 2 (two) times daily as needed for refractory nausea / vomiting. Start on day 3 after chemo. 30 tablet 1  . pravastatin (PRAVACHOL) 40 MG tablet Take 40 mg by mouth every 3 (three) days. M-W-F    . prochlorperazine (COMPAZINE) 10 MG tablet Take 1 tablet (10 mg total) by mouth every 6 (six) hours as needed (Nausea or vomiting). 30 tablet 1  . tamsulosin (FLOMAX) 0.4 MG CAPS capsule Take 0.4 mg by mouth daily after breakfast.    . traZODone (DESYREL) 50 MG tablet TAKE 1 TABLET (50 MG TOTAL) BY MOUTH AT BEDTIME AS NEEDED FOR SLEEP. 30 tablet 0   No current facility-administered medications on [name] prior to visit.      PAST SURGICAL HISTORY Past Surgical History:  Procedure Laterality Date  . CATARACT EXTRACTION, BILATERAL Bilateral   . NO PAST SURGERIES    . PORTACATH PLACEMENT Left 06/13/2016   Procedure: INSERTION PORT-A-CATH;  Surgeon: Aviva Signs, MD;   Location: [facility];  Service: General;  Laterality: Left;  Marland Kitchen VIDEO BRONCHOSCOPY WITH ENDOBRONCHIAL NAVIGATION N/A 05/28/2016   Procedure: VIDEO BRONCHOSCOPY WITH ENDOBRONCHIAL NAVIGATION;  Surgeon: [name], MD;  Location: [facility];  Service: Thoracic;  Laterality: N/A;  . VIDEO BRONCHOSCOPY WITH ENDOBRONCHIAL ULTRASOUND N/A 05/28/2016   Procedure: VIDEO BRONCHOSCOPY WITH ENDOBRONCHIAL ULTRASOUND;  Surgeon: [name], MD;  Location: [facility];  Service: Thoracic;  Laterality: N/A;    FAMILY HISTORY: Family History  Problem Relation Age of Onset  . Hypertension Mother   . Diabetes Father   . Heart disease Father   . Hypertension Sister    Mother deceased at age 30 from renal failure Father deceased at age 75 from stroke. He has 5 children:  67  yo son  72 yo daughter  12 yo daughter  42 yo daughter  45 yo son He denies any cancer in his family  SOCIAL HISTORY:  reports that he has been smoking Cigarettes.  He started smoking about 55 years ago. He has a 27.50 pack-year smoking history. He has never used smokeless tobacco. He reports that he does not drink alcohol or use drugs.  He recently cut his smoking down to 0.5 ppd.  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  Social History   Social History  . Marital status: Divorced    Spouse name: N/A  . Number of children: N/A  . Years of education: N/A   Social History Main Topics  . Smoking status: Current Every Day Smoker    Packs/day: 0.50    Years: 55.00    Types: Cigarettes    Start date: 03/11/1961  . Smokeless tobacco: Never Used  . Alcohol use No  . Drug use: No  . Sexual activity: Yes    Birth control/ protection: None   Other Topics Concern  . None   Social History Narrative  . None    PERFORMANCE STATUS: The patient's performance status is 0 - Asymptomatic  PHYSICAL EXAM: Most Recent Vital Signs: There were no vitals taken for this visit. There were no vitals  taken for this visit.  Physical Exam  Constitutional: He is oriented to person, place, and time and well-developed, well-nourished, and in no distress.  HENT:  Head: Normocephalic and atraumatic.  Mouth/Throat: Oropharynx is clear and moist.  Eyes: Conjunctivae and EOM are normal. Pupils are equal, round, and reactive to light.  Neck: Normal range of motion. Neck supple.  Cardiovascular: Normal rate, regular rhythm and normal heart sounds.  Exam reveals no gallop and no friction rub.   No murmur heard. Pulmonary/Chest: Effort normal and breath sounds normal. No respiratory distress. He has no wheezes. He has no rales. He exhibits no tenderness.  Abdominal: Soft. Bowel sounds are normal. He exhibits no distension and no mass. There is no tenderness. There is no rebound and no guarding.  Musculoskeletal: Normal range of motion.  Neurological: He is alert and oriented to person, place, and time. Gait normal.  Skin: Skin is warm and dry.  Nursing note and vitals reviewed.  LABORATORY DATA:  No results found for this or any previous visit (from the past 48 hour(s)).   CBC    Component Value Date/Time   WBC 8.1 08/10/2016 0817   RBC 3.11 (L) 08/10/2016 0817   HGB 10.4 (L) 08/10/2016 0817   HCT 30.0 (L) 08/10/2016 0817   HCT 36.9 (L) 07/14/2016 1442   PLT 232 08/10/2016 0817   PLT 266 07/14/2016 1442   MCV 96.5 08/10/2016 0817   MCV 92 07/14/2016 1442   MCH 33.4 08/10/2016 0817   MCHC 34.7 08/10/2016 0817   RDW 17.0 (H) 08/10/2016 0817   RDW 15.1 07/14/2016 1442   LYMPHSABS 0.2 (L) 08/05/2016 0414   LYMPHSABS 0.5 (L) 07/14/2016 1442   MONOABS 0.5 08/05/2016 0414   EOSABS 0.0 08/05/2016 0414   EOSABS 0.1 07/14/2016 1442   BASOSABS 0.0 08/05/2016 0414   BASOSABS 0.0 07/14/2016 1442   CMP Latest Ref Rng & Units 08/10/2016 08/06/2016 08/05/2016  Glucose 65 - 99 mg/dL 165(H) 150(H) 153(H)  BUN 6 - 20 mg/dL 20 22(H) 23(H)  Creatinine 0.61 - 1.24 mg/dL 0.82 0.69 0.82  Sodium 135 - 145  mmol/L 138  135 133(L)  Potassium 3.5 - 5.1 mmol/L 3.6 4.0 4.0  Chloride 101 - 111 mmol/L 102 103 97(L)  CO2 22 - 32 mmol/L '28 27 28  '$ Calcium 8.9 - 10.3 mg/dL 9.0 8.4(L) 9.0  Total Protein 6.5 - 8.1 g/dL 6.4(L) 5.3(L) 6.7  Total Bilirubin 0.3 - 1.2 mg/dL 0.6 0.3 0.6  Alkaline Phos 38 - 126 U/L 70 58 69  AST 15 - 41 U/L 17 13(L) 12(L)  ALT 17 - 63 U/L 20 14(L) 17   RADIOGRAPHY: I have personally reviewed the radiological images as listed and agreed with the findings in the report.  DG Chest 2 View 07/08/2016 IMPRESSION: No consolidation or effusion.  MRI Brain 05/06/2016 IMPRESSION: Negative for metastatic disease.   PATHOLOGY:     ASSESSMENT/PLAN:  Non-small cell lung cancer, right (HCC) NSCLC of right lung with hilar and paratracheal lymphadenopathy on PET imaging, favoring adenocarcinoma.  Complicated staging situation but in giving the benefit of the doubt, he is staged as a Stage IIB (T1bN1M0) with two adjacent RUL pulmonary nodules, RIGHT-sided lymphadenopathy as described AND a LUL pulmonary nodule measuring 11 mm with low metabolic activity biopsied but without any malignancy identified.  Currently undergoing concomitant chemoradiation with weekly Carboplatin/Paclitaxel beginning on 06/25/2016.  PLAN: Continue chemotherapy and radiation. He will proceed with cycle 6 carbo/taxol treatments. He will complete concurrent RT on 08/20/16, he has 2 days of treatments left.  I will order restaging scans in 4-5 weeks to assess response to chemoRT. Depending on scan results we will then discuss whether he will get any additional consolidative chemotherapy followed by immunotherapy (Imfinzi).  I will provide a letter for Social Services on behalf of the patient to request additional help in the home.  I counseled the patient about smoking cessation. I strongly encouraged him to stop smoking and patient verbalized understanding.   He will return for follow up in 5 weeks following restaging  scans.   THERAPY PLAN:  Concomitant chemoXRT with weekly Carboplatin/Paclitaxel +/- 2 additional cycles of Carboplatin/Paclitaxel full-dose every 21 days, followed by immunotherapy (Imfinzi) up to 52 weeks.  All questions were answered. The patient knows to call the clinic with any problems, questions or concerns. We can certainly see the patient much sooner if necessary.  This document serves as a record of services personally performed by Twana First, MD. It was created on her behalf by Maryla Morrow, a trained medical scribe. The creation of this record is based on the scribe's personal observations and the provider's statements to them. This document has been checked and approved by the attending provider.  I have reviewed the above documentation for accuracy and completeness and I agree with the above.  This note is electronically signed by:  Twana First, MD  08/15/2016 10:05 AM

## 2016-08-15 NOTE — Patient Instructions (Signed)
[facility] at [facility] Discharge Instructions  RECOMMENDATIONS MADE BY THE CONSULTANT AND ANY TEST RESULTS WILL BE SENT TO YOUR REFERRING PHYSICIAN.  You were seen today by Dr. Louise Zhou    Thank you for choosing [facility] at [facility] to provide your oncology and hematology care.  To afford each patient quality time with our provider, please arrive at least 15 minutes before your scheduled appointment time.    If you have a lab appointment with the Cancer Center please come in thru the  Main Entrance and check in at the main information desk  You need to re-schedule your appointment should you arrive 10 or more minutes late.  We strive to give you quality time with our providers, and arriving late affects you and other patients whose appointments are after yours.  Also, if you no show three or more times for appointments you may be dismissed from the clinic at the providers discretion.     Again, thank you for choosing [facility].  Our hope is that these requests will decrease the amount of time that you wait before being seen by our physicians.       _____________________________________________________________  Should you have questions after your visit to [facility], please contact our office at [phone] between the hours of 8:30 a.m. and 4:30 p.m.  Voicemails left after 4:30 p.m. will not be returned until the following business day.  For prescription refill requests, have your pharmacy contact our office.       Resources For Cancer Patients and their Caregivers ? American Cancer Society: Can assist with transportation, wigs, general needs, runs Look Good Feel Better.        1-888-227-6333 ? Cancer Care: Provides financial assistance, online support groups, medication/co-pay assistance.  1-800-813-HOPE (4673) ? [facility] Assists [place] cancer patients and their  families through emotional , educational and financial support.  336-427-4357 ? [place] DSS Where to apply for food stamps, Medicaid and utility assistance. 336-342-1394 ? [facility]: Transportation to medical appointments. 336-347-2287 ? Social Security Administration: May apply for disability if have a Stage IV cancer. 336-342-7796 1-800-772-1213 ? [place] Aging, Disability and Transit Services: Assists with nutrition, care and transit needs. 336-349-2343  Cancer Center Support Programs: @10RELATIVEDAYS@ > Cancer Support Group  2nd Tuesday of the month 1pm-2pm, Journey Room  > Creative Journey  3rd Tuesday of the month 1130am-1pm, Journey Room  > Look Good Feel Better  1st Wednesday of the month 10am-12 noon, Journey Room (Call American Cancer Society to register 1-800-395-5775)    

## 2016-08-18 ENCOUNTER — Telehealth (HOSPITAL_COMMUNITY): Payer: Self-pay | Admitting: *Deleted

## 2016-08-18 ENCOUNTER — Encounter (HOSPITAL_COMMUNITY): Payer: Self-pay | Admitting: Oncology

## 2016-08-19 DIAGNOSIS — C3411 Malignant neoplasm of upper lobe, right bronchus or lung: Secondary | ICD-10-CM | POA: Diagnosis not present

## 2016-08-19 DIAGNOSIS — Z510 Encounter for antineoplastic radiation therapy: Secondary | ICD-10-CM | POA: Diagnosis not present

## 2016-08-20 ENCOUNTER — Telehealth (HOSPITAL_COMMUNITY): Payer: Self-pay | Admitting: *Deleted

## 2016-08-20 DIAGNOSIS — C3411 Malignant neoplasm of upper lobe, right bronchus or lung: Secondary | ICD-10-CM | POA: Diagnosis not present

## 2016-08-20 DIAGNOSIS — Z510 Encounter for antineoplastic radiation therapy: Secondary | ICD-10-CM | POA: Diagnosis not present

## 2016-08-20 NOTE — Telephone Encounter (Signed)
Pt's wife called in to office and stated that pt had a fever and she had called EMS but patient refused to go to ER. She said his fever was 99 and now it was 97 "something". She said that prior to EMS getting to her house pt was jerking and disoriented. She states that after EMS got there - he felt better and now is just laying in the chair @ home. I tried multiple times to tell pt's wife that she needed to get him to the ER for evaluation and she said that he wasn't going to come. I got them an appt for 08/21/16 @ 0810 with Tom. She said she would get him here. I repeatedly expressed to her how serious it was that she get him to the ER and she said that she would bring him if he got worse.

## 2016-08-21 ENCOUNTER — Encounter (HOSPITAL_BASED_OUTPATIENT_CLINIC_OR_DEPARTMENT_OTHER): Payer: Medicare Other | Admitting: Oncology

## 2016-08-21 ENCOUNTER — Encounter (HOSPITAL_COMMUNITY): Payer: Self-pay | Admitting: Oncology

## 2016-08-21 ENCOUNTER — Encounter (HOSPITAL_COMMUNITY): Payer: Medicare Other

## 2016-08-21 VITALS — BP 123/92 | HR 107 | Temp 97.7°F | Resp 16 | Ht 65.0 in | Wt 173.5 lb

## 2016-08-21 DIAGNOSIS — C3491 Malignant neoplasm of unspecified part of right bronchus or lung: Secondary | ICD-10-CM

## 2016-08-21 DIAGNOSIS — R911 Solitary pulmonary nodule: Secondary | ICD-10-CM | POA: Diagnosis not present

## 2016-08-21 DIAGNOSIS — R0602 Shortness of breath: Secondary | ICD-10-CM | POA: Diagnosis not present

## 2016-08-21 DIAGNOSIS — R300 Dysuria: Secondary | ICD-10-CM | POA: Diagnosis not present

## 2016-08-21 DIAGNOSIS — E1142 Type 2 diabetes mellitus with diabetic polyneuropathy: Secondary | ICD-10-CM

## 2016-08-21 DIAGNOSIS — R739 Hyperglycemia, unspecified: Secondary | ICD-10-CM | POA: Diagnosis not present

## 2016-08-21 DIAGNOSIS — Z5111 Encounter for antineoplastic chemotherapy: Secondary | ICD-10-CM | POA: Diagnosis not present

## 2016-08-21 LAB — CBC WITH DIFFERENTIAL/PLATELET
BASOS PCT: 0 %
Basophils Absolute: 0 10*3/uL (ref 0.0–0.1)
Eosinophils Absolute: 0 10*3/uL (ref 0.0–0.7)
Eosinophils Relative: 1 %
HCT: 29.5 % — ABNORMAL LOW (ref 39.0–52.0)
HEMOGLOBIN: 10.1 g/dL — AB (ref 13.0–17.0)
Lymphocytes Relative: 3 %
Lymphs Abs: 0.1 10*3/uL — ABNORMAL LOW (ref 0.7–4.0)
MCH: 33.6 pg (ref 26.0–34.0)
MCHC: 34.2 g/dL (ref 30.0–36.0)
MCV: 98 fL (ref 78.0–100.0)
Monocytes Absolute: 0.3 10*3/uL (ref 0.1–1.0)
Monocytes Relative: 7 %
NEUTROS ABS: 3.8 10*3/uL (ref 1.7–7.7)
NEUTROS PCT: 89 %
Platelets: 170 10*3/uL (ref 150–400)
RBC: 3.01 MIL/uL — ABNORMAL LOW (ref 4.22–5.81)
RDW: 17.3 % — ABNORMAL HIGH (ref 11.5–15.5)
WBC: 4.3 10*3/uL (ref 4.0–10.5)

## 2016-08-21 LAB — COMPREHENSIVE METABOLIC PANEL
ALBUMIN: 2.9 g/dL — AB (ref 3.5–5.0)
ALK PHOS: 77 U/L (ref 38–126)
ALT: 20 U/L (ref 17–63)
ANION GAP: 8 (ref 5–15)
AST: 16 U/L (ref 15–41)
BILIRUBIN TOTAL: 0.5 mg/dL (ref 0.3–1.2)
BUN: 22 mg/dL — ABNORMAL HIGH (ref 6–20)
CALCIUM: 8.3 mg/dL — AB (ref 8.9–10.3)
CO2: 29 mmol/L (ref 22–32)
CREATININE: 0.91 mg/dL (ref 0.61–1.24)
Chloride: 96 mmol/L — ABNORMAL LOW (ref 101–111)
GFR calc Af Amer: >60 mL/min (ref >60)
GFR calc non Af Amer: >60 mL/min (ref >60)
GLUCOSE: 188 mg/dL — AB (ref 65–99)
Potassium: 3.4 mmol/L — ABNORMAL LOW (ref 3.5–5.1)
Sodium: 133 mmol/L — ABNORMAL LOW (ref 135–145)
Total Protein: 6 g/dL — ABNORMAL LOW (ref 6.5–8.1)

## 2016-08-21 NOTE — Progress Notes (Signed)
John Euler, MD [place] [id number]  Non-small cell lung cancer, right [facility]) - Plan: CBC with Differential, Comprehensive metabolic panel, MR Brain W Wo Contrast, CBC with Differential, Comprehensive metabolic panel, MR Brain W Wo Contrast  Pulmonary nodule, left  Type 2 diabetes mellitus with diabetic polyneuropathy, without long-term current use of insulin (HCC)  CURRENT THERAPY: S/P Concomitant chemoXRT  INTERVAL HISTORY: John [name] 71 y.o. male returns for followup of Stage IIIA (cT1B(2)cN2cM0) NSCLC, favoring adenocarcinoma, with PET avid RUL pulmonary nodules x 2 (adjacent to each other) measuring 1.7 cm and 2.0 cm and ipsilateral paratracheal lymphadenopathy (mediastinal adenopathy).  Initially seen on CT chest imaging on 04/17/2016 with PET scan on 05/05/2016 confirming hypermetabolic activity of identified abnormalities.  S/P navigational bronchoscopy with bruchings and FNAs of bilateral upper lobe nodules and EBUS with mediastinal and hilar lymph nodes aspirations by Dr. [name] on 05/28/2016.  LUL pulmonary nodule NOTE biopsy proven to be malignant, but concerning on imaging.  S/P concomitant chemoXRT consisting of Carboplatin/Paclitaxel weekly with close monitoring for progression of baseline peripheral neuropathy, finishing on 08/20/2016. AND LUL pulmonary nodule, negative biopsy, but PET avid. AND Diabetic peripheral neuropathy    Non-small cell lung cancer, right ([place])   04/17/2016 Imaging    CT chest- 1. Examination is positive for bilateral upper lobe pulmonary lesions suspicious for malignancy. Further investigation with PET-CT and tissue sampling is advised. 2. Borderline enlarged paratracheal lymph nodes and right hilar nodes. Cannot rule out metastatic adenopathy. Attention to these areas on PET-CT is advised. 3. Emphysema 4. Aortic atherosclerosis and multi vessel coronary artery calcification.      04/18/2016 Initial  Diagnosis    Adenocarcinoma of lung, right ([place])     04/24/2016 PET scan    1. Adjacent hypermetabolic nodules in the RIGHT upper lobe consistent bronchogenic carcinoma. 2. Suspicion of ipsilateral hilar and paratracheal nodal metastasis. 3. Nodule in the LEFT upper lobe with suspicious morphology has a relatively low metabolic activity for size. Favor synchronous primary bronchogenic carcinoma. 4. No metabolic activity associated with a small LEFT lower lobe pulmonary nodule. Recommend attention on follow-up.      05/06/2016 Imaging    MRI brain- Negative for metastatic disease.      05/28/2016 Procedure    1. Electromagnetic navigational bronchoscopy with brushings, needle     aspirations and biopsies of bilateral upper lobe nodules. 2. Endobronchial ultrasound with mediastinal and hilar lymph node     aspirations. By Dr. [name]      06/03/2016 Pathology Results    Diagnosis 1. Lung, biopsy, Right upper lobe - BLOOD AND BENIGN BRONCHIAL AND LUNG TISSUE. 2. Lung, biopsy, Left upper lobe - BLOOD AND BENIGN BRONCHIAL AND LUNG TISSUE.      06/03/2016 Pathology Results    Diagnosis TRANSBRONCHIAL NEEDLE ASPIRATION, NAVIGATION, RUL (SPECIMEN 5 OF 7, COLLECTED 05/28/16): MALIGNANT CELLS CONSISTENT WITH NON-SMALL CELL CARCINOMA Comment There are limited tumor cells in the cell block (insufficient for molecular testing). TTF-1 is positive in a few of the atypical cells. They appear negative for synaptophysin and cytokeratin 5/6. Overall there is limited tumor, but a lung adenocarcinoma is slightly favored. Dr. [name] has reviewed the case. The case was called to Dr. [name] on 06/03/2016.      06/13/2016 Procedure    Port placed by Dr. Arnoldo Morale      06/25/2016 - 08/15/2016 Chemotherapy    The patient had palonosetron (ALOXI) injection 0.25 mg, 0.25 mg, Intravenous,  Once, 6 of 6 cycles  CARBOplatin (PARAPLATIN) 220 mg in sodium chloride 0.9 % 250 mL chemo infusion, 220 mg  (100 % of original dose 215.8 mg), Intravenous,  Once, 6 of 6 cycles Dose modification:   (original dose 215.8 mg, Cycle 1),   (original dose 215.8 mg, Cycle 5),   (original dose 215.8 mg, Cycle 6),   (original dose 215.8 mg, Cycle 2),   (original dose 215.8 mg, Cycle 3),   (original dose 215.8 mg, Cycle 4)  PACLitaxel (TAXOL) 90 mg in dextrose 5 % 250 mL chemo infusion ( for chemotherapy treatment.        06/25/2016 - 08/20/2016 Radiation Therapy    Radiation in [place], [place] by Dr. Quitman Livings.  R lung to 6600 cGy      07/09/2016 - 07/10/2016 Hospital Admission    Admit date: 07/09/2016  Admission diagnosis: Fever and chills Additional comments: Negative work-up, discharged with course of Levaquin      07/09/2016 Treatment Plan Change    Treatment deferred x 1 week due to hospitalization       08/05/2016 - 08/06/2016 Hospital Admission    Admit date: 08/05/2016 Admission diagnosis: Joint pain Additional comments: Treated with steroids with symptomatic improvement        HPI Elements NSCLC  Location: RUL  Quality: Favoring adenocarcinoma  Severity: Stage IIIA  Duration: Dx on 05/28/2016  Context: LUL pulmonary nodule biopsied, but negative for disease despite PET avidity (low-level)  Timing: S/P concomitant chemoXRT with weekly Paclitaxel (06/25/2016- 08/20/2016)  Modifying Factors: Baseline diabetic peripheral neuropathy  Associated Signs & Symptoms:    Today, the patient does not look well.  He is pale.  He is fatigued.  He reports a fever but is afebrile in the clinic today.  He notes chest pain with swallowing only.  He does have bilateral lower extremity edema.  He reports constipation.  He also reports dizziness.  Numbness and tingling in his fingers is stable and secondary to diabetes.  He denies any chest pain unassociated with eating.  Patient's wife reports that he is confused at times.  Patient denies any LOC.  Interestingly, the patient noted some "jerking motion" while on the toilet  yesterday.  He reenacts his right leg movement when this occurred.  He also reports headaches.  I did discuss future therapy with Imfinzi in the consolidative setting.   Review of Systems  Constitutional: Positive for malaise/fatigue. Negative for chills, fever and weight loss.  HENT: Negative.   Eyes: Negative.   Respiratory: Negative.  Negative for cough.   Cardiovascular: Positive for leg swelling. Negative for chest pain.  Gastrointestinal: Positive for constipation. Negative for blood in stool, diarrhea, melena, nausea and vomiting.  Genitourinary: Negative.   Musculoskeletal: Negative.   Skin: Negative.   Neurological: Positive for dizziness, sensory change and headaches. Negative for weakness.  Endo/Heme/Allergies: Bruises/bleeds easily.  Psychiatric/Behavioral: Negative.     Past Medical History:  Diagnosis Date  . Adenocarcinoma of lung, right ([place]) 04/18/2016  . Anxiety   . Arthritis   . CHF (congestive heart failure) ([place])   . COPD (chronic obstructive pulmonary disease) ([place])   . Depression   . Diabetes mellitus without complication (HCC)    no meds  . DM (diabetes mellitus) ([place]) 07/09/2016  . Dyspnea   . History of kidney stones   . Hyperlipidemia   . Hypertension   . Macular degeneration   . Neuropathy   . Non-small cell lung cancer, right ([place]) 04/18/2016  .  Prostatitis   . Pulmonary nodule, left 07/16/2016  . Sleep apnea    cpap    Past Surgical History:  Procedure Laterality Date  . CATARACT EXTRACTION, BILATERAL Bilateral   . NO PAST SURGERIES    . PORTACATH PLACEMENT Left 06/13/2016   Procedure: INSERTION PORT-A-CATH;  Surgeon: Aviva Signs, MD;  Location: [facility];  Service: General;  Laterality: Left;  Marland Kitchen VIDEO BRONCHOSCOPY WITH ENDOBRONCHIAL NAVIGATION N/A 05/28/2016   Procedure: VIDEO BRONCHOSCOPY WITH ENDOBRONCHIAL NAVIGATION;  Surgeon: [name], MD;  Location: [facility];  Service: Thoracic;  Laterality: N/A;  . VIDEO BRONCHOSCOPY WITH  ENDOBRONCHIAL ULTRASOUND N/A 05/28/2016   Procedure: VIDEO BRONCHOSCOPY WITH ENDOBRONCHIAL ULTRASOUND;  Surgeon: [name], MD;  Location: [facility];  Service: Thoracic;  Laterality: N/A;    Family History  Problem Relation Age of Onset  . Hypertension Mother   . Diabetes Father   . Heart disease Father   . Hypertension Sister     Social History   Social History  . Marital status: Divorced    Spouse name: N/A  . Number of children: N/A  . Years of education: N/A   Social History Main Topics  . Smoking status: Current Every Day Smoker    Packs/day: 0.50    Years: 55.00    Types: Cigarettes    Start date: 03/11/1961  . Smokeless tobacco: Never Used  . Alcohol use No  . Drug use: No  . Sexual activity: Yes    Birth control/ protection: None   Other Topics Concern  . None   Social History Narrative  . None     PHYSICAL EXAMINATION  ECOG PERFORMANCE STATUS: 1 - Symptomatic but completely ambulatory  Vitals:   08/21/16 0912  BP: (!) 123/92  Pulse: (!) 107  Resp: 16  Temp: 97.7 F (36.5 C)    GENERAL:alert, ill looking, pale and accompanied by wife. SKIN: skin color, texture, turgor are normal, pale HEAD: Normocephalic, No masses, lesions, tenderness or abnormalities EYES: normal, EOMI EARS: External ears normal OROPHARYNX:mucous membranes are moist  NECK: supple, no adenopathy, trachea midline LYMPH:  no palpable lymphadenopathy BREAST:not examined LUNGS: clear to auscultation  HEART: regular rate & rhythm ABDOMEN:abdomen soft and normal bowel sounds BACK: Back symmetric, no curvature. EXTREMITIES:less then 2 second capillary refill, no joint deformities, effusion, or inflammation, no skin discoloration, no cyanosis, positive findings:  edema B/L LE edema, 1+ pitting.  NEURO: alert & oriented x 3 with fluent speech, no focal motor/sensory deficits, gait normal   LABORATORY DATA: CBC    Component Value Date/Time   WBC 7.4 08/15/2016 0947   RBC  3.05 (L) 08/15/2016 0947   HGB 10.2 (L) 08/15/2016 0947   HCT 30.0 (L) 08/15/2016 0947   HCT 36.9 (L) 07/14/2016 1442   PLT 247 08/15/2016 0947   PLT 266 07/14/2016 1442   MCV 98.4 08/15/2016 0947   MCV 92 07/14/2016 1442   MCH 33.4 08/15/2016 0947   MCHC 34.0 08/15/2016 0947   RDW 17.4 (H) 08/15/2016 0947   RDW 15.1 07/14/2016 1442   LYMPHSABS 0.4 (L) 08/15/2016 0947   LYMPHSABS 0.5 (L) 07/14/2016 1442   MONOABS 0.6 08/15/2016 0947   EOSABS 0.0 08/15/2016 0947   EOSABS 0.1 07/14/2016 1442   BASOSABS 0.0 08/15/2016 0947   BASOSABS 0.0 07/14/2016 1442      Chemistry      Component Value Date/Time   NA 135 08/15/2016 0947   NA 140 07/14/2016 1442   K 3.8 08/15/2016  0947   CL 98 (L) 08/15/2016 0947   CO2 29 08/15/2016 0947   BUN 26 (H) 08/15/2016 0947   BUN 18 07/14/2016 1442   CREATININE 0.76 08/15/2016 0947      Component Value Date/Time   CALCIUM 8.7 (L) 08/15/2016 0947   ALKPHOS 79 08/15/2016 0947   AST 17 08/15/2016 0947   ALT 26 08/15/2016 0947   BILITOT 0.2 (L) 08/15/2016 0947   BILITOT 0.3 02/01/2016 1007        PENDING LABS:   RADIOGRAPHIC STUDIES:  Dg Pelvis 1-2 Views  Result Date: 08/10/2016 CLINICAL DATA:  Pelvic pain after fall yesterday. EXAM: PELVIS - 1-2 VIEW COMPARISON:  None. FINDINGS: There is no evidence of pelvic fracture or diastasis. No pelvic bone lesions are seen. IMPRESSION: Normal pelvis. Electronically Signed   By: Marijo Conception, M.D.   On: 08/10/2016 08:11   Dg Wrist Complete Left  Result Date: 08/10/2016 CLINICAL DATA:  Left wrist pain after fall yesterday. EXAM: LEFT WRIST - COMPLETE 3+ VIEW COMPARISON:  None. FINDINGS: There is no evidence of fracture or dislocation. Mild degenerative changes seen involving the first carpometacarpal joint. Soft tissues are unremarkable. IMPRESSION: Mild osteoarthritis of the first carpometacarpal joint. No acute abnormality seen in the left wrist. Electronically Signed   By: Marijo Conception, M.D.    On: 08/10/2016 08:07   Dg Wrist Complete Right  Result Date: 08/10/2016 CLINICAL DATA:  Right wrist pain after fall yesterday. EXAM: RIGHT WRIST - COMPLETE 3+ VIEW COMPARISON:  None. FINDINGS: There is no evidence of fracture or dislocation. There is no evidence of arthropathy or other focal bone abnormality. Soft tissues are unremarkable. IMPRESSION: No acute abnormality seen in the right wrist. Electronically Signed   By: Marijo Conception, M.D.   On: 08/10/2016 08:10     PATHOLOGY:    ASSESSMENT AND PLAN:  Non-small cell lung cancer, right (HCC) Stage IIIA (cT1B(2)cN2cM0) NSCLC, favoring adenocarcinoma, with PET avid RUL pulmonary nodules x 2 (adjacent to each other) measuring 1.7 cm and 2.0 cm and ipsilateral paratracheal lymphadenopathy (mediastinal adenopathy).  Initially seen on CT chest imaging on 04/17/2016 with PET scan on 05/05/2016 confirming hypermetabolic activity of identified abnormalities.  S/P navigational bronchoscopy with bruchings and FNAs of bilateral upper lobe nodules and EBUS with mediastinal and hilar lymph nodes aspirations by Dr. [name] on 05/28/2016.  LUL pulmonary nodule NOTE biopsy proven to be malignant, but concerning on imaging.  S/P concomitant chemoXRT consisting of Carboplatin/Paclitaxel weekly with close monitoring for progression of baseline peripheral neuropathy, finishing on 08/20/2016.  Oncology history updated.  Labs today: CBC diff, CMET.  I personally reviewed and went over laboratory results with the patient.  The results are noted within this dictation.  He reports a constellation of symptoms including dizziness, headaches, and intermittent "jerking" motion of limb.  He reports an episode of his right leg jerking with inability to stop while on toilet.  With this, I have ordered a STAT MRI brain w and wo contrast.  I have strongly urged increase in H2O intake, 64 or greater ounces per day.  He admits to poor H2O intake.  He has B/L LE edema.  He is  scheduled for ECHO on 09/17/2016.  I have encouraged LE elevation.  He denies any chest pain (unrelated to radiation esophagitis).    I have reviewed the NCCN guidelines pertaining to treatment.  Given his Stage III disease, he is a candidate for consolidative immunotherapy, Imfinzi.  I would not  consider him a good candidate for additional systemic chemotherapy (consolidative Carboplatin/Paclitaxel).  Therefore, we discussed Imfinzi for up to 52 weeks.  I have discussed starting immunotherapy, namely Imfinzi given recent data: In a phase III trial, over 700 patients with unresectable stage III NSCLC without progression after at least two cycles of platinum-based chemoradiotherapy were randomly assigned to the programmed death ligand 1 (PD-L1) antibody durvalumab or placebo in a 2:1 ratio. Relative to placebo, durvalumab increased the median PFS (16.8 versus 5.6 months; HR for disease progression or death 0.52, 95% CI 0.42-0.65), response rate (28 versus 16 percent; relative risk [RR] 1.78, 95% CI 1.27-2.51), and median time to death or distant metastasis (23.2 versus 14.6 months; HR 0.52, 95% CI 0.39-0.69). The benefit in PFS with durvalumab was observed irrespective of PD-L1 expression before chemoradiotherapy and was evident in both smokers and nonsmokers. OS results are immature. Grade 3 or 4 adverse events occurred in 30 percent of the patients who received durvalumab and 26 percent of those who received placebo, with the most common severe adverse event being pneumonia. These results demonstrate efficacy and tolerability of durvalumab for treatment of unresectable stage III NSCLC in patients who experience an objective response or stable disease following completion of chemoradiotherapy.  I reviewed the risks, benefits, alternatives, and side effects of Durvalumab, including, but not limited to, peripheral edema, fatigue, skin rash, hyponatremia, constipation, decreased appetite, nausea, abdominal pain,  colitis, diarrhea, tract infection, lymphocytopenia, infection, muscular skeletal pain, dyspnea, dyspnea on exertion, fever, cough.  I will try to restage him with PET given the unknown status of the LUL pulmonary nodule that demonstrated low-level avidity at time of diagnosis.  Given that the plan is to pursue immunotherapy within 42 days of completing definitive chemoXRT, it would be imperative to know and understand the baseline FDG uptake of the LUL pulmonary lesion prior to starting immunotherapy.  If denied, then we will need to pursue CT CAP to restage his disease prior to moving on to [facility] therapy.  PET scan is scheduled for 09/18/2016.  Order is placed for PET scan in ~ 3 weeks.   Return in 4 weeks for follow-up and review imaging and to plan to start [place].   Pulmonary nodule, left LUL pulmonary nodule measuring 11 mm at time of diagnosis with low-level metabolic activity on PET imaging.  S/P biopsy by Dr. [name] on 05/28/2016 was negative for malignancy, but concern remains.  I have ordered a restaging PET scan.  If concern for disease remains, consultation with XRT in [place] for SBRT would not be unreasonable per discussion at [facility] in March 2018.  I would be hesitant to upstage him to Stage IV disease, instead, I would consider him a Stage IIIA on the Right and a Stage I on the left (giving him the benefit of the doubt).  DM (diabetes mellitus) ([place]) DM with diabetic peripheral neuropathy.  Difficult to ascertain subjective baseline neuropathy issue.  Concern for worsening secondary to Paclitaxel chemotherapy.   ORDERS PLACED FOR THIS ENCOUNTER: Orders Placed This Encounter  Procedures  . MR Brain W Wo Contrast  . CBC with Differential  . Comprehensive metabolic panel    MEDICATIONS PRESCRIBED THIS ENCOUNTER: No orders of the defined types were placed in this encounter.   THERAPY PLAN:  Restage with PET scan on 09/18/2016 and plan on  consolidative Imfinzi therapy in the future.  All questions were answered. The patient knows to call the clinic with any problems, questions or concerns.  We can certainly see the patient much sooner if necessary.  Patient and plan discussed with Dr. Twana First and she is in agreement with the aforementioned.   This note is electronically signed by: Doy Mince 08/21/2016 9:35 AM

## 2016-08-21 NOTE — Assessment & Plan Note (Signed)
DM with diabetic peripheral neuropathy.  Difficult to ascertain subjective baseline neuropathy issue.  Concern for worsening secondary to Paclitaxel chemotherapy.

## 2016-08-21 NOTE — Patient Instructions (Signed)
[place] at [facility] Discharge Instructions  RECOMMENDATIONS MADE BY THE CONSULTANT AND ANY TEST RESULTS WILL BE SENT TO YOUR REFERRING PHYSICIAN.  You were seen today by Kirby Crigler PA-C. MRI STAT. Blood work today. ECHO 09/17/16, PET 6/14. Increase water intake, elevate legs. Return on 09/22/16 for follow up.    Thank you for choosing [place] at [facility] to provide your oncology and hematology care.  To afford each patient quality time with our provider, please arrive at least 15 minutes before your scheduled appointment time.    If you have a lab appointment with the [facility] please come in thru the  Main Entrance and check in at the main information desk  You need to re-schedule your appointment should you arrive 10 or more minutes late.  We strive to give you quality time with our providers, and arriving late affects you and other patients whose appointments are after yours.  Also, if you no show three or more times for appointments you may be dismissed from the clinic at the providers discretion.     Again, thank you for choosing [facility].  Our hope is that these requests will decrease the amount of time that you wait before being seen by our physicians.       _____________________________________________________________  Should you have questions after your visit to [facility], please contact our office at [phone] between the hours of 8:30 a.m. and 4:30 p.m.  Voicemails left after 4:30 p.m. will not be returned until the following business day.  For prescription refill requests, have your pharmacy contact our office.       Resources For Cancer Patients and their Caregivers ? American Cancer Society: Can assist with transportation, wigs, general needs, runs Look Good Feel Better.        279-199-2974 ? Cancer Care: Provides financial assistance, online support groups,  medication/co-pay assistance.  1-800-813-HOPE 650 478 6815) ? [place] Assists [place] cancer patients and their families through emotional , educational and financial support.  639-152-2608 ? [place] DSS Where to apply for food stamps, Medicaid and utility assistance. 210-270-5924 ? [facility]: Transportation to medical appointments. 403-833-4819 ? Social Security Administration: May apply for disability if have a Stage IV cancer. 561 548 3866 [phone] ? [employer], Disability and Transit Services: Assists with nutrition, care and transit needs. [place] Support Programs: '@10RELATIVEDAYS'$ @ > Cancer Support Group  2nd Tuesday of the month 1pm-2pm, Journey Room  > Creative Journey  3rd Tuesday of the month 1130am-1pm, Journey Room  > Look Good Feel Better  1st Wednesday of the month 10am-12 noon, Journey Room (Call [place] to register [phone])

## 2016-08-21 NOTE — Progress Notes (Signed)
DISCONTINUE ON PATHWAY REGIMEN - Non-Small Cell Lung     Administer weekly:     Paclitaxel        Dose Mod: None     Carboplatin        Dose Mod: None  **Always confirm dose/schedule in your pharmacy ordering system**    REASON: Continuation Of Treatment PRIOR TREATMENT: [id number]: Carboplatin AUC=2 + Paclitaxel 45 mg/m2 Weekly During Radiation TREATMENT RESPONSE: N/A - Adjuvant Therapy  START ON PATHWAY REGIMEN - Non-Small Cell Lung     A cycle is every 14 days:     Durvalumab   **Always confirm dose/schedule in your pharmacy ordering system**    Patient Characteristics: Stage III - Unresectable, PS = 0, 1 AJCC T Category: T1b Current Disease Status: No Distant Mets or Local Recurrence AJCC N Category: N2 AJCC M Category: M0 AJCC 8 Stage Grouping: IIIA Performance Status: PS = 0, 1  Intent of Therapy: Curative Intent, Discussed with Patient

## 2016-08-21 NOTE — Progress Notes (Signed)
ON PATHWAY REGIMEN - Non-Small Cell Lung  No Change  Continue With Treatment as Ordered.     Administer weekly:     Paclitaxel        Dose Mod: None     Carboplatin        Dose Mod: None  **Always confirm dose/schedule in your pharmacy ordering system**    Patient Characteristics: Stage IIA/IIB - Unresectable AJCC T Category: T1b Current Disease Status: No Distant Mets or Local Recurrence AJCC N Category: N1 AJCC M Category: M0 AJCC 8 Stage Grouping: IIB  Intent of Therapy: Curative Intent, Discussed with Patient

## 2016-08-21 NOTE — Assessment & Plan Note (Addendum)
Stage IIIA (cT1B(2)cN2cM0) NSCLC, favoring adenocarcinoma, with PET avid RUL pulmonary nodules x 2 (adjacent to each other) measuring 1.7 cm and 2.0 cm and ipsilateral paratracheal lymphadenopathy (mediastinal adenopathy).  Initially seen on CT chest imaging on 04/17/2016 with PET scan on 05/05/2016 confirming hypermetabolic activity of identified abnormalities.  S/P navigational bronchoscopy with bruchings and FNAs of bilateral upper lobe nodules and EBUS with mediastinal and hilar lymph nodes aspirations by Dr. [name] on 05/28/2016.  LUL pulmonary nodule NOTE biopsy proven to be malignant, but concerning on imaging.  S/P concomitant chemoXRT consisting of Carboplatin/Paclitaxel weekly with close monitoring for progression of baseline peripheral neuropathy, finishing on 08/20/2016.  Oncology history updated.  Labs today: CBC diff, CMET.  I personally reviewed and went over laboratory results with the patient.  The results are noted within this dictation.  He reports a constellation of symptoms including dizziness, headaches, and intermittent "jerking" motion of limb.  He reports an episode of his right leg jerking with inability to stop while on toilet.  With this, I have ordered a STAT MRI brain w and wo contrast.  I have strongly urged increase in H2O intake, 64 or greater ounces per day.  He admits to poor H2O intake.  He has B/L LE edema.  He is scheduled for ECHO on 09/17/2016.  I have encouraged LE elevation.  He denies any chest pain (unrelated to radiation esophagitis).    I have reviewed the NCCN guidelines pertaining to treatment.  Given his Stage III disease, he is a candidate for consolidative immunotherapy, Imfinzi.  I would not consider him a good candidate for additional systemic chemotherapy (consolidative Carboplatin/Paclitaxel).  Therefore, we discussed Imfinzi for up to 52 weeks.  I have discussed starting immunotherapy, namely Imfinzi given recent data: In a phase III trial, over  700 patients with unresectable stage III NSCLC without progression after at least two cycles of platinum-based chemoradiotherapy were randomly assigned to the programmed death ligand 1 (PD-L1) antibody durvalumab or placebo in a 2:1 ratio. Relative to placebo, durvalumab increased the median PFS (16.8 versus 5.6 months; HR for disease progression or death 0.52, 95% CI 0.42-0.65), response rate (28 versus 16 percent; relative risk [RR] 1.78, 95% CI 1.27-2.51), and median time to death or distant metastasis (23.2 versus 14.6 months; HR 0.52, 95% CI 0.39-0.69). The benefit in PFS with durvalumab was observed irrespective of PD-L1 expression before chemoradiotherapy and was evident in both smokers and nonsmokers. OS results are immature. Grade 3 or 4 adverse events occurred in 30 percent of the patients who received durvalumab and 26 percent of those who received placebo, with the most common severe adverse event being pneumonia. These results demonstrate efficacy and tolerability of durvalumab for treatment of unresectable stage III NSCLC in patients who experience an objective response or stable disease following completion of chemoradiotherapy.  I reviewed the risks, benefits, alternatives, and side effects of Durvalumab, including, but not limited to, peripheral edema, fatigue, skin rash, hyponatremia, constipation, decreased appetite, nausea, abdominal pain, colitis, diarrhea, tract infection, lymphocytopenia, infection, muscular skeletal pain, dyspnea, dyspnea on exertion, fever, cough.  I will try to restage him with PET given the unknown status of the LUL pulmonary nodule that demonstrated low-level avidity at time of diagnosis.  Given that the plan is to pursue immunotherapy within 42 days of completing definitive chemoXRT, it would be imperative to know and understand the baseline FDG uptake of the LUL pulmonary lesion prior to starting immunotherapy.  If denied, then we will need to pursue  CT CAP to  restage his disease prior to moving on to [facility] therapy.  PET scan is scheduled for 09/18/2016.  Order is placed for PET scan in ~ 3 weeks.   Return in 4 weeks for follow-up and review imaging and to plan to start [place].

## 2016-08-21 NOTE — Assessment & Plan Note (Signed)
LUL pulmonary nodule measuring 11 mm at time of diagnosis with low-level metabolic activity on PET imaging.  S/P biopsy by Dr. [name] on 05/28/2016 was negative for malignancy, but concern remains.  I have ordered a restaging PET scan.  If concern for disease remains, consultation with XRT in [place] for SBRT would not be unreasonable per discussion at [facility] in March 2018.  I would be hesitant to upstage him to Stage IV disease, instead, I would consider him a Stage IIIA on the Right and a Stage I on the left (giving him the benefit of the doubt).

## 2016-08-23 ENCOUNTER — Other Ambulatory Visit: Payer: Self-pay | Admitting: Family Medicine

## 2016-08-25 ENCOUNTER — Ambulatory Visit (HOSPITAL_COMMUNITY)
Admission: RE | Admit: 2016-08-25 | Discharge: 2016-08-25 | Disposition: A | Discharge status: Home / Self Care | Payer: Medicare Other | Source: Ambulatory Visit | Attending: Oncology | Admitting: Oncology

## 2016-08-25 DIAGNOSIS — G4761 Periodic limb movement disorder: Secondary | ICD-10-CM | POA: Insufficient documentation

## 2016-08-25 DIAGNOSIS — R42 Dizziness and giddiness: Secondary | ICD-10-CM | POA: Insufficient documentation

## 2016-08-25 DIAGNOSIS — I6782 Cerebral ischemia: Secondary | ICD-10-CM | POA: Diagnosis not present

## 2016-08-25 DIAGNOSIS — R262 Difficulty in walking, not elsewhere classified: Secondary | ICD-10-CM | POA: Diagnosis not present

## 2016-08-25 DIAGNOSIS — H539 Unspecified visual disturbance: Secondary | ICD-10-CM | POA: Diagnosis not present

## 2016-08-25 DIAGNOSIS — C3491 Malignant neoplasm of unspecified part of right bronchus or lung: Secondary | ICD-10-CM | POA: Insufficient documentation

## 2016-08-25 MED ORDER — GADOBENATE DIMEGLUMINE 529 MG/ML IV SOLN
15.0000 mL | Freq: Once | INTRAVENOUS | Status: AC | PRN
  Administered 2016-08-25: 15 mL via INTRAVENOUS

## 2016-08-28 ENCOUNTER — Ambulatory Visit: Payer: Medicare Other | Admitting: Family Medicine

## 2016-08-28 ENCOUNTER — Encounter: Payer: Self-pay | Admitting: Family Medicine

## 2016-08-28 ENCOUNTER — Ambulatory Visit (HOSPITAL_COMMUNITY)
Admission: RE | Admit: 2016-08-28 | Discharge: 2016-08-28 | Disposition: A | Discharge status: Home / Self Care | Payer: Medicare Other | Source: Ambulatory Visit | Attending: Family Medicine | Admitting: Family Medicine

## 2016-08-28 ENCOUNTER — Ambulatory Visit (INDEPENDENT_AMBULATORY_CARE_PROVIDER_SITE_OTHER): Payer: Medicare Other | Admitting: Family Medicine

## 2016-08-28 VITALS — BP 109/59 | HR 103 | Temp 97.4°F | Ht 65.0 in | Wt 171.4 lb

## 2016-08-28 DIAGNOSIS — I2510 Atherosclerotic heart disease of native coronary artery without angina pectoris: Secondary | ICD-10-CM

## 2016-08-28 DIAGNOSIS — R918 Other nonspecific abnormal finding of lung field: Secondary | ICD-10-CM | POA: Diagnosis not present

## 2016-08-28 DIAGNOSIS — C3490 Malignant neoplasm of unspecified part of unspecified bronchus or lung: Secondary | ICD-10-CM | POA: Diagnosis not present

## 2016-08-28 DIAGNOSIS — C7801 Secondary malignant neoplasm of right lung: Secondary | ICD-10-CM | POA: Diagnosis not present

## 2016-08-28 DIAGNOSIS — M7989 Other specified soft tissue disorders: Secondary | ICD-10-CM | POA: Diagnosis not present

## 2016-08-28 DIAGNOSIS — K5730 Diverticulosis of large intestine without perforation or abscess without bleeding: Secondary | ICD-10-CM | POA: Diagnosis not present

## 2016-08-28 DIAGNOSIS — I700 Atherosclerosis of aorta: Secondary | ICD-10-CM | POA: Diagnosis not present

## 2016-08-28 DIAGNOSIS — J439 Emphysema, unspecified: Secondary | ICD-10-CM | POA: Diagnosis not present

## 2016-08-28 DIAGNOSIS — C3491 Malignant neoplasm of unspecified part of right bronchus or lung: Secondary | ICD-10-CM | POA: Insufficient documentation

## 2016-08-28 DIAGNOSIS — I313 Pericardial effusion (noninflammatory): Secondary | ICD-10-CM | POA: Diagnosis not present

## 2016-08-28 NOTE — Progress Notes (Signed)
BP (!) 109/59   Pulse (!) 103   Temp 97.4 F (36.3 C) (Oral)   Ht 5' 5" (1.651 m)   Wt 171 lb 6 oz (77.7 kg)   BMI 28.52 kg/m    Subjective:    Patient ID: John [name], male    DOB: September 11, 1945, 71 y.o.   MRN: [id number]  HPI: John [name] is a 71 y.o. male presenting on 08/28/2016 for Edema in feet/ankles (x 3 weeks; recently finished chemo and radiation)   HPI Bilateral leg swelling Patient has been having bilateral leg swelling for the past 3 weeks since finishing chemotherapy. At has improved a little bit but he is still concerned about it. He says he may have had a fever last week as well but none since. Denies any cough more than normal. He has had some cough and congestion but that's been going on for some time since the chemotherapy and radiation. Recommended that he go back to oncology and discuss this as I do not know what chemotherapy drugs he was on with his radiation. He denies any shortness of breath or chest pain. The swelling is slightly more on his right leg than his left. The swelling is mostly in the feet but was in the calves before and has gone down a little bit. The leg swelling has not been red or warm or painful.  Relevant past medical, surgical, family and social history reviewed and updated as indicated. Interim medical history since our last visit reviewed. Allergies and medications reviewed and updated.  Review of Systems  Constitutional: Negative for chills and fever.  HENT: Positive for congestion.   Respiratory: Positive for cough. Negative for shortness of breath and wheezing.   Cardiovascular: Positive for leg swelling. Negative for chest pain.  Musculoskeletal: Negative for back pain and gait problem.  Skin: Negative for rash.  All other systems reviewed and are negative.   Per HPI unless specifically indicated above     Objective:    BP (!) 109/59   Pulse (!) 103   Temp 97.4 F (36.3 C) (Oral)   Ht 5' 5" (1.651 m)   Wt 171 lb 6 oz  (77.7 kg)   BMI 28.52 kg/m   Wt Readings from Last 3 Encounters:  08/28/16 171 lb 6 oz (77.7 kg)  08/21/16 173 lb 8 oz (78.7 kg)  08/15/16 176 lb (79.8 kg)    Physical Exam  Constitutional: He is oriented to person, place, and time. He appears well-developed and well-nourished. No distress.  Eyes: Conjunctivae are normal. No scleral icterus.  Cardiovascular: Normal rate, regular rhythm, normal heart sounds and intact distal pulses.   No murmur heard. Pulmonary/Chest: Effort normal and breath sounds normal. No respiratory distress. He has no wheezes. He has no rales.  Musculoskeletal: Normal range of motion. He exhibits edema (2+ edema in both feet, slightly more on right than left, trace edema in calves, no erythema or redness or warmth). He exhibits no tenderness.  Neurological: He is alert and oriented to person, place, and time. Coordination normal.  Skin: Skin is warm and dry. No rash noted. He is not diaphoretic.  Psychiatric: He has a normal mood and affect. His behavior is normal.  Nursing note and vitals reviewed.     Assessment & Plan:   Problem List Items Addressed This Visit    None    Visit Diagnoses    Leg swelling    -  Primary   Relevant Orders   CMP14+EGFR  CBC with Differential/Platelet   US Venous Img Lower Bilateral       Follow up plan: Return if symptoms worsen or fail to improve.  Counseling provided for all of the vaccine components Orders Placed This Encounter  Procedures  . US Venous Img Lower Unilateral Left  . US Venous Img Lower Unilateral Right  . CMP14+EGFR  . CBC with Differential/Platelet    Caryl Pina, MD [facility] 08/28/2016, 11:07 AM

## 2016-08-29 ENCOUNTER — Telehealth: Payer: Self-pay | Admitting: Family Medicine

## 2016-08-29 LAB — CBC WITH DIFFERENTIAL/PLATELET
BASOS ABS: 0 10*3/uL (ref 0.0–0.2)
Basos: 0 %
EOS (ABSOLUTE): 0 10*3/uL (ref 0.0–0.4)
Eos: 1 %
HEMOGLOBIN: 9.6 g/dL — AB (ref 13.0–17.7)
Hematocrit: 27.7 % — ABNORMAL LOW (ref 37.5–51.0)
Immature Grans (Abs): 0 10*3/uL (ref 0.0–0.1)
Immature Granulocytes: 1 %
LYMPHS ABS: 0.6 10*3/uL — AB (ref 0.7–3.1)
Lymphs: 14 %
MCH: 33.3 pg — AB (ref 26.6–33.0)
MCHC: 34.7 g/dL (ref 31.5–35.7)
MCV: 96 fL (ref 79–97)
MONOS ABS: 0.6 10*3/uL (ref 0.1–0.9)
Monocytes: 14 %
Neutrophils Absolute: 3 10*3/uL (ref 1.4–7.0)
Neutrophils: 70 %
PLATELETS: 268 10*3/uL (ref 150–379)
RBC: 2.88 x10E6/uL — AB (ref 4.14–5.80)
RDW: 19.4 % — AB (ref 12.3–15.4)
WBC: 4.2 10*3/uL (ref 3.4–10.8)

## 2016-08-29 LAB — CMP14+EGFR
ALT: 22 [IU]/L (ref 0–44)
AST: 14 [IU]/L (ref 0–40)
Albumin/Globulin Ratio: 1.4 (ref 1.2–2.2)
Albumin: 3.5 g/dL (ref 3.5–4.8)
Alkaline Phosphatase: 92 [IU]/L (ref 39–117)
BUN/Creatinine Ratio: 17 (ref 10–24)
BUN: 15 mg/dL (ref 8–27)
Bilirubin Total: 0.5 mg/dL (ref 0.0–1.2)
CO2: 25 mmol/L (ref 18–29)
Calcium: 9 mg/dL (ref 8.6–10.2)
Chloride: 97 mmol/L (ref 96–106)
Creatinine, Ser: 0.86 mg/dL (ref 0.76–1.27)
GFR calc Af Amer: 102 mL/min/{1.73_m2}
GFR calc non Af Amer: 88 mL/min/{1.73_m2}
Globulin, Total: 2.5 g/dL (ref 1.5–4.5)
Glucose: 155 mg/dL — ABNORMAL HIGH (ref 65–99)
Potassium: 4.3 mmol/L (ref 3.5–5.2)
Sodium: 139 mmol/L (ref 134–144)
Total Protein: 6 g/dL (ref 6.0–8.5)

## 2016-08-29 NOTE — Telephone Encounter (Signed)
Patient aware.

## 2016-09-02 ENCOUNTER — Telehealth: Payer: Self-pay | Admitting: Cardiovascular Disease

## 2016-09-02 NOTE — Telephone Encounter (Signed)
Pre-cert Verification for the following procedure    Limited Echo scheduled for 09/17/16 at [facility]

## 2016-09-06 NOTE — Addendum Note (Signed)
Addendum  created 09/06/16 0953 by Duane Boston, MD   Sign clinical note

## 2016-09-08 ENCOUNTER — Ambulatory Visit (INDEPENDENT_AMBULATORY_CARE_PROVIDER_SITE_OTHER): Payer: Medicare Other | Admitting: Nurse Practitioner

## 2016-09-08 ENCOUNTER — Encounter: Payer: Self-pay | Admitting: Nurse Practitioner

## 2016-09-08 ENCOUNTER — Other Ambulatory Visit: Payer: Self-pay | Admitting: Family Medicine

## 2016-09-08 VITALS — BP 100/55 | HR 105 | Temp 96.6°F | Ht 65.0 in | Wt 170.0 lb

## 2016-09-08 DIAGNOSIS — I2510 Atherosclerotic heart disease of native coronary artery without angina pectoris: Secondary | ICD-10-CM | POA: Diagnosis not present

## 2016-09-08 DIAGNOSIS — S91101A Unspecified open wound of right great toe without damage to nail, initial encounter: Secondary | ICD-10-CM

## 2016-09-08 DIAGNOSIS — I951 Orthostatic hypotension: Secondary | ICD-10-CM

## 2016-09-08 NOTE — Progress Notes (Signed)
   Subjective:    Patient ID: John [name], male    DOB: 13-Jul-1945, 71 y.o.   MRN: [id number]  HPI  Patient come sin today with 2 complaints: - dizziness- he is currently on chemo for lung cancer and that has caused his blood pressure to drop. He has stopped taking his metropolol at night. Blood pressure has still been low. Today 100/55 but has been running 458.K systolic. Dizziness usually occurs when he goes from sitting to standing. - patient has a sore on his right great toe- he is a diabetic and cannot fill his toes. Just noticed area yesterday.   Review of Systems  HENT: Negative.   Respiratory: Negative.   Cardiovascular: Negative.   Gastrointestinal: Negative.   Genitourinary: Negative.   Musculoskeletal: Negative.   Skin: Positive for wound (right great toe).  Neurological: Positive for dizziness and weakness.  Psychiatric/Behavioral: Negative.   All other systems reviewed and are negative.      Objective:   Physical Exam  Constitutional: He is oriented to person, place, and time. He appears well-developed and well-nourished. No distress.  Cardiovascular: Normal rate and regular rhythm.   Pulmonary/Chest: Effort normal.  Neurological: He is alert and oriented to person, place, and time.  Skin: Skin is warm.  4cm annular lesion 1/2 cm deep under surface of right great toe  Psychiatric: He has a normal mood and affect. His behavior is normal. Judgment and thought content normal.    BP (!) 100/55   Pulse (!) 105   Temp (!) 96.6 F (35.9 C) (Oral)   Ht 5\' 5"  (1.651 m)   Wt 170 lb (77.1 kg)   BMI 28.29 kg/m   duoderm applied    Assessment & Plan:  1. Orthostatic hypotension Rise slowly from sitting to standing Continue to keep check of blood pressures at home  2. Open wound of right great toe, initial encounter Keep duoderm on wound until sees wound care elevate when sitting - AMB referral to wound care center  [place], [place]

## 2016-09-08 NOTE — Patient Instructions (Signed)
Hypotension As your heart beats, it forces blood through your body. This force is called blood pressure. If you have hypotension, you have low blood pressure. When your blood pressure is too low, you may not get enough blood to your brain. You may feel weak, feel light-headed, have a fast heartbeat, or even pass out (faint). Follow these instructions at home: Eating and drinking  Drink enough fluids to keep your pee (urine) clear or pale yellow.  Eat a healthy diet, and follow instructions from your doctor about eating or drinking restrictions. A healthy diet includes: ? Fresh fruits and vegetables. ? Whole grains. ? Low-fat (lean) meats. ? Low-fat dairy products.  Eat extra salt only as told. Do not add extra salt to your diet unless your doctor tells you to.  Eat small meals often.  Avoid standing up quickly after you eat. Medicines  Take over-the-counter and prescription medicines only as told by your doctor. ? Follow instructions from your doctor about changing how much you take (the dosage) of your medicines, if this applies. ? Do not stop or change your medicine on your own. General instructions  Wear compression stockings as told by your doctor.  Get up slowly from lying down or sitting.  Avoid hot showers and a lot of heat as told by your doctor.  Return to your normal activities as told by your doctor. Ask what activities are safe for you.  Do not use any products that contain nicotine or tobacco, such as cigarettes and e-cigarettes. If you need help quitting, ask your doctor.  Keep all follow-up visits as told by your doctor. This is important. Contact a doctor if:  You throw up (vomit).  You have watery poop (diarrhea).  You have a fever for more than 2-3 days.  You feel more thirsty than normal.  You feel weak and tired. Get help right away if:  You have chest pain.  You have a fast or irregular heartbeat.  You lose feeling (get numbness) in any part  of your body.  You cannot move your arms or your legs.  You have trouble talking.  You get sweaty or feel light-headed.  You faint.  You have trouble breathing.  You have trouble staying awake.  You feel confused. This information is not intended to replace advice given to you by your health care provider. Make sure you discuss any questions you have with your health care provider. Document Released: 06/18/2009 Document Revised: 12/11/2015 Document Reviewed: 12/11/2015 Elsevier Interactive Patient Education  2017 [employer]   

## 2016-09-09 ENCOUNTER — Other Ambulatory Visit: Payer: Self-pay | Admitting: Family Medicine

## 2016-09-10 ENCOUNTER — Other Ambulatory Visit: Payer: Self-pay | Admitting: Family Medicine

## 2016-09-11 DIAGNOSIS — Z0189 Encounter for other specified special examinations: Secondary | ICD-10-CM | POA: Diagnosis not present

## 2016-09-11 DIAGNOSIS — L97511 Non-pressure chronic ulcer of other part of right foot limited to breakdown of skin: Secondary | ICD-10-CM | POA: Diagnosis not present

## 2016-09-12 NOTE — Telephone Encounter (Signed)
John [name] pt

## 2016-09-17 ENCOUNTER — Other Ambulatory Visit: Payer: Self-pay

## 2016-09-17 ENCOUNTER — Ambulatory Visit (INDEPENDENT_AMBULATORY_CARE_PROVIDER_SITE_OTHER): Payer: Medicare Other

## 2016-09-17 DIAGNOSIS — I313 Pericardial effusion (noninflammatory): Secondary | ICD-10-CM

## 2016-09-17 DIAGNOSIS — I3139 Other pericardial effusion (noninflammatory): Secondary | ICD-10-CM

## 2016-09-17 LAB — ECHOCARDIOGRAM LIMITED
CHL CUP STROKE VOLUME: 45 mL
FS: 39 % (ref 28–44)
IVS/LV PW RATIO, ED: 0.61
LA diam end sys: 33 mm
LA vol A4C: 52.1 ml
LADIAMINDEX: 1.78 cm/m2
LASIZE: 33 mm
LAVOL: 59.7 mL
LAVOLIN: 32.3 mL/m2
LV dias vol index: 40 mL/m2
LV dias vol: 75 mL (ref 62–150)
LV sys vol: 30 mL (ref 21–61)
LVSYSVOLIN: 16 mL/m2
PW: 8.61 mm — AB (ref 0.6–1.1)
Simpson's disk: 60

## 2016-09-18 ENCOUNTER — Ambulatory Visit (HOSPITAL_COMMUNITY)
Admission: RE | Admit: 2016-09-18 | Discharge: 2016-09-18 | Disposition: A | Discharge status: Home / Self Care | Payer: Medicare Other | Source: Ambulatory Visit | Attending: Oncology | Admitting: Oncology

## 2016-09-18 DIAGNOSIS — I313 Pericardial effusion (noninflammatory): Secondary | ICD-10-CM | POA: Diagnosis not present

## 2016-09-18 DIAGNOSIS — C3491 Malignant neoplasm of unspecified part of right bronchus or lung: Secondary | ICD-10-CM | POA: Diagnosis not present

## 2016-09-18 DIAGNOSIS — R918 Other nonspecific abnormal finding of lung field: Secondary | ICD-10-CM | POA: Diagnosis not present

## 2016-09-18 DIAGNOSIS — C7801 Secondary malignant neoplasm of right lung: Secondary | ICD-10-CM | POA: Diagnosis not present

## 2016-09-18 DIAGNOSIS — L97511 Non-pressure chronic ulcer of other part of right foot limited to breakdown of skin: Secondary | ICD-10-CM | POA: Diagnosis not present

## 2016-09-18 DIAGNOSIS — K5730 Diverticulosis of large intestine without perforation or abscess without bleeding: Secondary | ICD-10-CM | POA: Diagnosis not present

## 2016-09-18 DIAGNOSIS — M7989 Other specified soft tissue disorders: Secondary | ICD-10-CM | POA: Diagnosis not present

## 2016-09-18 LAB — GLUCOSE, CAPILLARY: Glucose-Capillary: 151 mg/dL — ABNORMAL HIGH (ref 65–99)

## 2016-09-18 MED ORDER — FLUDEOXYGLUCOSE F - 18 (FDG) INJECTION
8.5400 | Freq: Once | INTRAVENOUS | Status: AC | PRN
  Administered 2016-09-18: 8.54 via INTRAVENOUS

## 2016-09-19 ENCOUNTER — Telehealth: Payer: Self-pay | Admitting: *Deleted

## 2016-09-19 DIAGNOSIS — I3139 Other pericardial effusion (noninflammatory): Secondary | ICD-10-CM

## 2016-09-19 DIAGNOSIS — I313 Pericardial effusion (noninflammatory): Secondary | ICD-10-CM

## 2016-09-19 NOTE — Telephone Encounter (Signed)
Notes recorded by Laurine Blazer, LPN on 06/19/3886 at 75:79 AM EDT Left message to return call.  ------  Notes recorded by Herminio Commons, MD on 09/18/2016 at 12:06 PM EDT Normal pumping function with small to moderate fluid collection around the heart. Repeat limited echo to reassess pericardial effusion in 3 months.

## 2016-09-19 NOTE — Telephone Encounter (Signed)
-----   Message from Herminio Commons, MD sent at 09/18/2016 12:06 PM EDT ----- Normal pumping function with small to moderate fluid collection around the heart. Repeat limited echo to reassess pericardial effusion in 3 months.

## 2016-09-22 ENCOUNTER — Encounter (HOSPITAL_COMMUNITY): Payer: Medicare Other | Attending: Oncology | Admitting: Oncology

## 2016-09-22 ENCOUNTER — Encounter (HOSPITAL_COMMUNITY): Payer: Medicare Other

## 2016-09-22 ENCOUNTER — Encounter (HOSPITAL_COMMUNITY): Payer: Self-pay | Admitting: Oncology

## 2016-09-22 VITALS — BP 132/62 | HR 109 | Resp 16 | Ht 65.0 in | Wt 170.0 lb

## 2016-09-22 DIAGNOSIS — Z5111 Encounter for antineoplastic chemotherapy: Secondary | ICD-10-CM | POA: Insufficient documentation

## 2016-09-22 DIAGNOSIS — C3491 Malignant neoplasm of unspecified part of right bronchus or lung: Secondary | ICD-10-CM

## 2016-09-22 DIAGNOSIS — R5383 Other fatigue: Secondary | ICD-10-CM

## 2016-09-22 DIAGNOSIS — Z720 Tobacco use: Secondary | ICD-10-CM

## 2016-09-22 DIAGNOSIS — R300 Dysuria: Secondary | ICD-10-CM | POA: Insufficient documentation

## 2016-09-22 DIAGNOSIS — R599 Enlarged lymph nodes, unspecified: Secondary | ICD-10-CM | POA: Diagnosis not present

## 2016-09-22 DIAGNOSIS — R911 Solitary pulmonary nodule: Secondary | ICD-10-CM | POA: Diagnosis not present

## 2016-09-22 DIAGNOSIS — R739 Hyperglycemia, unspecified: Secondary | ICD-10-CM | POA: Insufficient documentation

## 2016-09-22 DIAGNOSIS — R0602 Shortness of breath: Secondary | ICD-10-CM | POA: Insufficient documentation

## 2016-09-22 NOTE — Progress Notes (Signed)
Chemotherapy teaching pulled together.     Chemotherapy teaching completed.  Consent completed.  Extensive teaching packet given.

## 2016-09-22 NOTE — Progress Notes (Signed)
[facility] Hematology/Oncology Progress Note  Name: John [name]      MRN: [id number]   Date: 09/22/2016 Time:11:00 AM   REFERRING PHYSICIAN:  [name], MD (Primary Care Provider)  REASON FOR CONSULT:  Pulmonary lesion(s)   DIAGNOSIS:  Multifocal RUL pulmonary lesions, 1.7 cm and 2.0 cm with RIGHT hilar and paratracheal lymphadenopathy with a LUL pulmonary lesion with low hypermetabolic activity measuring 1.1 cm.    Non-small cell lung cancer, right ([place])   04/17/2016 Imaging    CT chest- 1. Examination is positive for bilateral upper lobe pulmonary lesions suspicious for malignancy. Further investigation with PET-CT and tissue sampling is advised. 2. Borderline enlarged paratracheal lymph nodes and right hilar nodes. Cannot rule out metastatic adenopathy. Attention to these areas on PET-CT is advised. 3. Emphysema 4. Aortic atherosclerosis and multi vessel coronary artery calcification.      04/18/2016 Initial Diagnosis    Adenocarcinoma of lung, right ([place])     04/24/2016 PET scan    1. Adjacent hypermetabolic nodules in the RIGHT upper lobe consistent bronchogenic carcinoma. 2. Suspicion of ipsilateral hilar and paratracheal nodal metastasis. 3. Nodule in the LEFT upper lobe with suspicious morphology has a relatively low metabolic activity for size. Favor synchronous primary bronchogenic carcinoma. 4. No metabolic activity associated with a small LEFT lower lobe pulmonary nodule. Recommend attention on follow-up.      05/06/2016 Imaging    MRI brain- Negative for metastatic disease.      05/28/2016 Procedure    1. Electromagnetic navigational bronchoscopy with brushings, needle     aspirations and biopsies of bilateral upper lobe nodules. 2. Endobronchial ultrasound with mediastinal and hilar lymph node     aspirations. By Dr. [name]      06/03/2016 Pathology Results    Diagnosis 1. Lung, biopsy, Right upper lobe - BLOOD AND BENIGN  BRONCHIAL AND LUNG TISSUE. 2. Lung, biopsy, Left upper lobe - BLOOD AND BENIGN BRONCHIAL AND LUNG TISSUE.      06/03/2016 Pathology Results    Diagnosis TRANSBRONCHIAL NEEDLE ASPIRATION, NAVIGATION, RUL (SPECIMEN 5 OF 7, COLLECTED 05/28/16): MALIGNANT CELLS CONSISTENT WITH NON-SMALL CELL CARCINOMA Comment There are limited tumor cells in the cell block (insufficient for molecular testing). TTF-1 is positive in a few of the atypical cells. They appear negative for synaptophysin and cytokeratin 5/6. Overall there is limited tumor, but a lung adenocarcinoma is slightly favored. Dr. [name] has reviewed the case. The case was called to Dr. [name] on 06/03/2016.      06/13/2016 Procedure    Port placed by Dr. Arnoldo Morale      06/25/2016 - 08/15/2016 Chemotherapy    The patient had palonosetron (ALOXI) injection 0.25 mg, 0.25 mg, Intravenous,  Once, 6 of 6 cycles  CARBOplatin (PARAPLATIN) 220 mg in sodium chloride 0.9 % 250 mL chemo infusion, 220 mg (100 % of original dose 215.8 mg), Intravenous,  Once, 6 of 6 cycles Dose modification:   (original dose 215.8 mg, Cycle 1),   (original dose 215.8 mg, Cycle 5),   (original dose 215.8 mg, Cycle 6),   (original dose 215.8 mg, Cycle 2),   (original dose 215.8 mg, Cycle 3),   (original dose 215.8 mg, Cycle 4)  PACLitaxel (TAXOL) 90 mg in dextrose 5 % 250 mL chemo infusion ( for chemotherapy treatment.        06/25/2016 - 08/20/2016 Radiation Therapy    Radiation in [place], [place] by Dr. Quitman Livings.  R lung to 6600 cGy  07/09/2016 - 07/10/2016 Hospital Admission    Admit date: 07/09/2016  Admission diagnosis: Fever and chills Additional comments: Negative work-up, discharged with course of Levaquin      07/09/2016 Treatment Plan Change    Treatment deferred x 1 week due to hospitalization       08/05/2016 - 08/06/2016 Hospital Admission    Admit date: 08/05/2016 Admission diagnosis: Joint pain Additional comments: Treated with steroids with symptomatic  improvement      08/25/2016 Imaging    MRI brain- Negative for intracranial metastasis on this motion degraded examination.  Stable mild-to-moderate chronic small vessel ischemic disease.      09/18/2016 PET scan    1. Partial metabolic response. Right upper lobe 2.6 cm hypermetabolic pulmonary nodule, decreased in size and metabolism. Mildly hypermetabolic right hilar nodal metastasis, decreased in metabolism. Right paratracheal lymph node is no longer hypermetabolic. No new or progressive metastatic disease. 2. Apical left upper lobe 0.9 cm solid pulmonary nodule, slightly decreased in size, with no significant metabolism. The change in size could indicate a neoplastic etiology for this nodule. 3. Additional subcentimeter pulmonary nodules in the left lung are stable and below PET resolution . 4. Additional findings include aortic atherosclerosis, coronary atherosclerosis, moderate emphysema, stable small pericardial effusion/thickening and mild sigmoid diverticulosis.       HISTORY OF PRESENT ILLNESS:   John [name] is a 71 y.o. male returns for follow up of pulmonary masses. He is accompanied by his wife today.  He states that he is doing well except that he's been having positive hypotension last week. He went to see his family doctor and had a reduction in his antihypertensive medications. He continues to have fatigue but it is stable and has not worsened. Otherwise he denies any chest pain, shortness breath, abdominal pain, focal weakness.  Review of Systems  Constitutional: Positive for malaise/fatigue. Negative for chills and fever.  HENT: Negative.  Negative for hearing loss, sore throat and tinnitus.   Eyes: Negative for blurred vision, photophobia, discharge and redness.  Respiratory: Negative.  Negative for cough, hemoptysis, shortness of breath and wheezing.   Cardiovascular: Negative.  Negative for chest pain, palpitations, orthopnea, claudication and leg  swelling.  Gastrointestinal: Negative.  Negative for abdominal pain, constipation, diarrhea, melena, nausea and vomiting.  Genitourinary: Negative.  Negative for dysuria and hematuria.  Musculoskeletal: Negative.  Negative for back pain, joint pain and myalgias.  Skin: Negative.  Negative for itching and rash.  Neurological: Negative.  Negative for dizziness, weakness and headaches.  Endo/Heme/Allergies: Negative.  Negative for environmental allergies and polydipsia. Does not bruise/bleed easily.  Psychiatric/Behavioral: Negative for depression and substance abuse. The patient is not nervous/anxious and does not have insomnia.   All other systems reviewed and are negative. 14 point review of systems was performed and is negative except as detailed under history of present illness and above  PAST MEDICAL HISTORY:   Past Medical History:  Diagnosis Date  . Adenocarcinoma of lung, right ([place] [place]) 04/18/2016  . Anxiety   . Arthritis   . CHF (congestive heart failure) ([place])   . COPD (chronic obstructive pulmonary disease) ([place])   . Depression   . Diabetes mellitus without complication ([facility])    no meds  . DM (diabetes mellitus) ([place]) 07/09/2016  . Dyspnea   . History of kidney stones   . Hyperlipidemia   . Hypertension   . Macular degeneration   . Neuropathy   . Non-small cell lung cancer, right ([place]) 04/18/2016  . Prostatitis   .  Pulmonary nodule, left 07/16/2016  . Sleep apnea    cpap    ALLERGIES: No Known Allergies    MEDICATIONS: I have reviewed the patient's current medications.    Current Outpatient Prescriptions on File Prior to Visit  Medication Sig Dispense Refill  . albuterol (PROVENTIL HFA;VENTOLIN HFA) 108 (90 Base) MCG/ACT inhaler Inhale 2 puffs into the lungs every 6 (six) hours as needed for wheezing or shortness of breath. 1 Inhaler 2  . aspirin EC 81 MG tablet Take 81 mg by mouth daily.    . cloNIDine (CATAPRES) 0.1 MG tablet Take 1 tablet (0.1 mg total) by mouth 3  (three) times daily. 90 tablet 3  . finasteride (PROSCAR) 5 MG tablet TAKE 1 TABLET EVERY DAY 30 tablet 11  . fluticasone (FLONASE) 50 MCG/ACT nasal spray Place 2 sprays into both nostrils daily. (Patient taking differently: Place 2 sprays into both nostrils at bedtime as needed for allergies. ) 16 g 6  . Fluticasone-Salmeterol (ADVAIR DISKUS IN) Inhale 1 puff into the lungs 2 (two) times daily.    Marland Kitchen gabapentin (NEURONTIN) 300 MG capsule TAKE 1 CAPSULE (300 MG TOTAL) BY MOUTH 3 (THREE) TIMES A DAY. 90 capsule 0  . INCRUSE ELLIPTA 62.5 MCG/INH AEPB INHALE 1 PUFF INTO THE LUNGS DAILY. 30 each 2  . LORazepam (ATIVAN) 0.5 MG tablet TAKE 1 TABLET BY MOUTH TWICE A DAY AS NEEDED 30 tablet 1  . pravastatin (PRAVACHOL) 40 MG tablet Take 40 mg by mouth every 3 (three) days. M-W-F    . tamsulosin (FLOMAX) 0.4 MG CAPS capsule Take 0.4 mg by mouth daily after breakfast.    . TRADJENTA 5 MG TABS tablet TAKE 1 TABLET (5 MG TOTAL) BY MOUTH DAILY. 30 tablet 1  . traZODone (DESYREL) 50 MG tablet TAKE 1 TABLET (50 MG TOTAL) BY MOUTH AT BEDTIME AS NEEDED FOR SLEEP. 30 tablet 0  . acetaminophen (TYLENOL) 325 MG tablet Take 2 tablets (650 mg total) by mouth every 6 (six) hours as needed for mild pain (or Fever >/= 101). (Patient not taking: Reported on 09/22/2016)    . fentaNYL (DURAGESIC - DOSED MCG/HR) 12 MCG/HR Place 1 patch (12.5 mcg total) onto the skin every 3 (three) days. (Patient not taking: Reported on 09/22/2016) 10 patch 0  . HYDROcodone-acetaminophen (NORCO/VICODIN) 5-325 MG tablet Take 2 tablets by mouth every 4 (four) hours as needed. (Patient not taking: Reported on 09/22/2016) 6 tablet 0  . ibuprofen (ADVIL,MOTRIN) 600 MG tablet Take 1 tablet (600 mg total) by mouth every 6 (six) hours as needed. (Patient not taking: Reported on 09/22/2016) 30 tablet 0  . magic mouthwash w/lidocaine SOLN USE 1 TEASPOONFUL BY 15MINS BEFORE MEALS AND AS NEEDED FOR DISCOMFORT,SWISH AND SWALLOW  1  . omeprazole (PRILOSEC) 20 MG  capsule Take 20 mg by mouth daily.     . [DISCONTINUED] prochlorperazine (COMPAZINE) 10 MG tablet Take 1 tablet (10 mg total) by mouth every 6 (six) hours as needed (Nausea or vomiting). 30 tablet 1   No current facility-administered medications on file prior to visit.      PAST SURGICAL HISTORY Past Surgical History:  Procedure Laterality Date  . CATARACT EXTRACTION, BILATERAL Bilateral   . NO PAST SURGERIES    . PORTACATH PLACEMENT Left 06/13/2016   Procedure: INSERTION PORT-A-CATH;  Surgeon: Aviva Signs, MD;  Location: [facility];  Service: General;  Laterality: Left;  Marland Kitchen VIDEO BRONCHOSCOPY WITH ENDOBRONCHIAL NAVIGATION N/A 05/28/2016   Procedure: VIDEO BRONCHOSCOPY WITH ENDOBRONCHIAL NAVIGATION;  Surgeon: Remo Lipps  Chaya Jan, MD;  Location: [facility];  Service: Thoracic;  Laterality: N/A;  . VIDEO BRONCHOSCOPY WITH ENDOBRONCHIAL ULTRASOUND N/A 05/28/2016   Procedure: VIDEO BRONCHOSCOPY WITH ENDOBRONCHIAL ULTRASOUND;  Surgeon: [name], MD;  Location: [facility];  Service: Thoracic;  Laterality: N/A;    FAMILY HISTORY: Family History  Problem Relation Age of Onset  . Hypertension Mother   . Diabetes Father   . Heart disease Father   . Hypertension Sister    Mother deceased at age 39 from renal failure Father deceased at age 74 from stroke. He has 5 children:  66 yo son  40 yo daughter  68 yo daughter  47 yo daughter  83 yo son He denies any cancer in his family  SOCIAL HISTORY:  reports that he has been smoking Cigarettes.  He started smoking about 55 years ago. He has a 27.50 pack-year smoking history. He has never used smokeless tobacco. He reports that he does not drink alcohol or use drugs.  He recently cut his smoking down to 0.5 ppd.  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  Social History   Social History  . Marital status: Divorced    Spouse name: N/A  . Number of children: N/A  . Years of education: N/A   Social History Main  Topics  . Smoking status: Current Every Day Smoker    Packs/day: 0.50    Years: 55.00    Types: Cigarettes    Start date: 03/11/1961  . Smokeless tobacco: Never Used  . Alcohol use No  . Drug use: No  . Sexual activity: Yes    Birth control/ protection: None   Other Topics Concern  . None   Social History Narrative  . None    PERFORMANCE STATUS: The patient's performance status is 1 - Symptomatic but completely ambulatory  PHYSICAL EXAM: Most Recent Vital Signs: Blood pressure 132/62, pulse (!) 109, resp. rate 16, height 5\' 5"  (1.651 m), weight 170 lb (77.1 kg), SpO2 96 %. BP 132/62 (BP Location: Left Arm, Patient Position: Sitting)   Pulse (!) 109   Resp 16   Ht 5\' 5"  (1.651 m)   Wt 170 lb (77.1 kg)   SpO2 96%   BMI 28.29 kg/m   Physical Exam  Constitutional: He is oriented to person, place, and time and well-developed, well-nourished, and in no distress. No distress.  HENT:  Head: Normocephalic and atraumatic.  Mouth/Throat: Oropharynx is clear and moist. No oropharyngeal exudate.  Eyes: Conjunctivae and EOM are normal. Pupils are equal, round, and reactive to light. No scleral icterus.  Neck: Normal range of motion. Neck supple. No JVD present.  Cardiovascular: Normal rate, regular rhythm and normal heart sounds.  Exam reveals no gallop and no friction rub.   No murmur heard. Pulmonary/Chest: Effort normal and breath sounds normal. No respiratory distress. He has no wheezes. He has no rales. He exhibits no tenderness.  Abdominal: Soft. Bowel sounds are normal. He exhibits no distension and no mass. There is no tenderness. There is no rebound and no guarding.  Musculoskeletal: Normal range of motion. He exhibits no edema or tenderness.  1+ right pedal edema, bandaged diabetic ulcer on big toe of right foot  Lymphadenopathy:    He has no cervical adenopathy.  Neurological: He is alert and oriented to person, place, and time. No cranial nerve deficit. Gait normal.    Skin: Skin is warm and dry. No rash noted. No erythema.  No pallor.  Psychiatric: Affect and judgment normal.  Nursing note and vitals reviewed.  LABORATORY DATA:  No results found for this or any previous visit (from the past 48 hour(s)).   CBC    Component Value Date/Time   WBC 4.2 08/28/2016 1117   WBC 4.3 08/21/2016 0930   RBC 2.88 (L) 08/28/2016 1117   RBC 3.01 (L) 08/21/2016 0930   HGB 9.6 (L) 08/28/2016 1117   HCT 27.7 (L) 08/28/2016 1117   PLT 268 08/28/2016 1117   MCV 96 08/28/2016 1117   MCH 33.3 (H) 08/28/2016 1117   MCH 33.6 08/21/2016 0930   MCHC 34.7 08/28/2016 1117   MCHC 34.2 08/21/2016 0930   RDW 19.4 (H) 08/28/2016 1117   LYMPHSABS 0.6 (L) 08/28/2016 1117   MONOABS 0.3 08/21/2016 0930   EOSABS 0.0 08/28/2016 1117   BASOSABS 0.0 08/28/2016 1117   CMP Latest Ref Rng & Units 08/28/2016 08/21/2016 08/15/2016  Glucose 65 - 99 mg/dL 155(H) 188(H) 161(H)  BUN 8 - 27 mg/dL 15 22(H) 26(H)  Creatinine 0.76 - 1.27 mg/dL 0.86 0.91 0.76  Sodium 134 - 144 mmol/L 139 133(L) 135  Potassium 3.5 - 5.2 mmol/L 4.3 3.4(L) 3.8  Chloride 96 - 106 mmol/L 97 96(L) 98(L)  CO2 18 - 29 mmol/L 25 29 29   Calcium 8.6 - 10.2 mg/dL 9.0 8.3(L) 8.7(L)  Total Protein 6.0 - 8.5 g/dL 6.0 6.0(L) 6.3(L)  Total Bilirubin 0.0 - 1.2 mg/dL 0.5 0.5 0.2(L)  Alkaline Phos 39 - 117 IU/L 92 77 79  AST 0 - 40 IU/L 14 16 17   ALT 0 - 44 IU/L 22 20 26    RADIOGRAPHY: I have personally reviewed the radiological images as listed and agreed with the findings in the report.  DG Chest 2 View 07/08/2016 IMPRESSION: No consolidation or effusion.  MRI Brain 05/06/2016 IMPRESSION: Negative for metastatic disease.   PATHOLOGY:     ASSESSMENT/PLAN:  Non-small cell lung cancer, right ([facility]) NSCLC of right lung with hilar and paratracheal lymphadenopathy on PET imaging, favoring adenocarcinoma.  Complicated staging situation but in giving the benefit of the doubt, he is staged as a Stage IIB (T1bN1M0) with two  adjacent RUL pulmonary nodules, RIGHT-sided lymphadenopathy as described AND a LUL pulmonary nodule measuring 11 mm with low metabolic activity biopsied but without any malignancy identified.  Currently undergoing concomitant chemoradiation with weekly Carboplatin/Paclitaxel beginning on 06/25/2016.  PLAN: I have reviewed patient's PET scan in detail with him and his wife. A copy of his PET scan was given to the patient. He's had a partial response with decrease in size of his primary tumor on his PET scan.  Will proceed with consolidative immunotherapy with Imfinzi, first dose to start on 09/26/16. I have gone over side effects of infancy in detail with the patient and his wife and he has also received formal education regarding Imfinzi by our nurse navigator.  Follow-up in 4 weeks.  THERAPY PLAN:  Imfinzi q14 days for up to 52 weeks  This note is electronically signed by:  Twana First, MD  09/22/2016 11:00 AM

## 2016-09-22 NOTE — Patient Instructions (Signed)
[place]   CHEMOTHERAPY INSTRUCTIONS  You have Stage 3 lung Cancer.  We are going to treat you with Imfinzi.  This will be given every 2 weeks for the next year.  This infusion takes 1 hour to infuse.   You will see the doctor regularly throughout treatment.  We monitor your lab work prior to every treatment.  The doctor monitors your response to treatment by the way you are feeling, your blood work, and scans periodically.   POTENTIAL SIDE EFFECTS OF TREATMENT:  Durvalumab (Imfinzi)  About This Drug Durvalumab is used to treat cancer. This drug is given in the vein (IV).  Possible Side Effects . Tiredness . Muscle and bone pain . Constipation (not able to move bowels) . Decreased appetite (decreased hunger) . Nausea and throwing up (vomiting). . Swelling of your legs, ankles and/or feet . Urinary tract infection. Symptoms may include: . pain or burning when you pass urine . feeling like you have to pass urine often, but not much comes out when you do. . tender or heavy feeling in your lower abdomen . cloudy urine and/or urine that smells bad . pain on one side of your back under your ribs. This is where your kidneys are. . fever, chills, nausea and/or throwing up Note: Each of the side effects above was reported in 15% or greater of patients treated with durvalumab. Not all possible side effects are included above.  Warnings and Precautions . Inflammation (swelling) of the lungs. You may have a dry cough or trouble breathing. . Colitis which is swelling in the colon. The symptoms are loose bowel movements (diarrhea) stomach cramping, and sometimes blood in the bowel movements. . Changes in your liver function. . This drug may affect how your kidneys work . This drug may affect some of your hormone glands (especially the thyroid, adrenals, pituitary and pancreas). Your doctor may check your hormone levels as needed. . Increased risk of  infection. You may get a fever and chills. . While you are getting this drug in your vein (IV), you may have a reaction to the drug. Sometimes you may be given medication to stop or lessen these side effects. Your nurse will check you closely for these signs: fever or shaking chills, flushing, facial swelling, feeling dizzy, headache, trouble breathing, rash, itching, chest tightness, or chest pain. These reactions may happen for 24 hours after your infusion. If this happens, call 911 for emergency care.  Treating Side Effects . Manage tiredness by pacing your activities for the day. Be sure to include periods of rest between energy-draining activities . Keeping your pain under control is important to your well-being. Please tell your doctor or nurse if you are experiencing pain. . Ask your doctor or nurse about medicines that are available to help stop or lessen constipation. . If you are not able to move your bowels, check with your doctor or nurse before you use enemas, laxatives, or suppositories . Drink plenty of fluids (a minimum of eight glasses per day is recommended). . If you throw up or have loose bowel movements, you should drink more fluids so that you do not become dehydrated (lack water in the body from losing too much fluid). . To help with nausea and vomiting, eat small, frequent meals instead of three large meals a day. Choose foods and drinks that are at room temperature. Ask your nurse or doctor about other helpful tips and medicine that is available to  help or stop lessen these symptoms. . To help with decreased appetite, eat small, frequent meals . Eat high caloric food such as pudding, ice cream, yogurt and milkshakes. . Infusion reactions may happen for 24 hours after your infusion. If this happens, call 911 for emergency care.  Food and Drug Interactions . There are no known interactions of durvalumab with food. This drug may interact with other medicines. Tell  your doctor and pharmacist about all the medicines and dietary supplements (vitamins, minerals, herbs and others) that you are taking at this time. The safety and use of dietary supplements and alternative diets are often not known. Using these might affect your cancer or interfere with your treatment. Until more is known, you should not use dietary supplements or alternative diets without your cancer doctor's help.  When to Call the Doctor Call your doctor or nurse if you have any of these symptoms and/or any new or unusual symptoms: . Fever of 100.5 F (38 C) or higher . Chills, flushing . Wheezing, chest pain or trouble breathing . Facial swelling . Rash, itching or skin blistering . Rash that is not relieved by prescribed medicines . Pain when passing urine . Feeling dizzy or lightheaded . Confusion . Nausea that stops you from eating or drinking or is not relieved by prescribed medicine . No bowel movement for 3 days or you feel uncomfortable . Severe pain in the stomach area . Pain or burning when you pass urine. . Difficulty urinating . Feeling like you have to pass urine often, but not much comes out when you do. . Tender or heavy feeling in your lower abdomen. . Cloudy urine and/or urine that smells bad. . Pain on one side of your back under your ribs. This is where your kidneys are. . Decreased urine . Unusual thirst or passing urine often . Fatigue that interferes with your daily activities . Signs of liver problems: dark urine, pale bowel movements, bad stomach pain, feeling very tired and weak, unusual itching, or yellowing of the eyes or skin . Signs of infusion reaction: fever or shaking chills, flushing, facial swelling, feeling dizzy, headache, trouble breathing, rash, itching, chest tightness, or chest pain. . If you think you are pregnant  Reproduction Warnings . Pregnancy warning: This drug can have harmful effects on the unborn baby. It is recommended that  effective methods of birth control should be used by women of child-bearing age during cancer treatment and for at least 3 months after treatment. . Breast feeding warning: Women should not breast feed during treatment and for 3 month after treatment because this drug could enter the breast milk and cause harm to a breast feeding baby.    SELF CARE ACTIVITIES WHILE ON CHEMOTHERAPY: Hydration Increase your fluid intake 48 hours prior to treatment and drink at least 8 to 12 cups (64 ounces) of water/decaff beverages per day after treatment. You can still have your cup of coffee or soda but these beverages do not count as part of your 8 to 12 cups that you need to drink daily. No alcohol intake.  Medications Continue taking your normal prescription medication as prescribed.  If you start any new herbal or new supplements please let us know first to make sure it is safe.  Mouth Care Have teeth cleaned professionally before starting treatment. Keep dentures and partial plates clean. Use soft toothbrush and do not use mouthwashes that contain alcohol. Biotene is a good mouthwash that is available at most pharmacies or  may be ordered by calling [phone]. Use warm salt water gargles (1 teaspoon salt per 1 quart warm water) before and after meals and at bedtime. Or you may rinse with 2 tablespoons of three-percent hydrogen peroxide mixed in eight ounces of water. If you are still having problems with your mouth or sores in your mouth please call the clinic. If you need dental work, please let the doctor know before you go for your appointment so that we can coordinate the best possible time for you in regards to your chemo regimen. You need to also let your dentist know that you are actively taking chemo. We may need to do labs prior to your dental appointment.   Skin Care Always use sunscreen that has not expired and with SPF (Sun Protection Factor) of 50 or higher. Wear hats to protect your head  from the sun. Remember to use sunscreen on your hands, ears, face, & feet.  Use good moisturizing lotions such as udder cream, eucerin, or even Vaseline. Some chemotherapies can cause dry skin, color changes in your skin and nails.    . Avoid long, hot showers or baths. . Use gentle, fragrance-free soaps and laundry detergent. . Use moisturizers, preferably creams or ointments rather than lotions because the thicker consistency is better at preventing skin dehydration. Apply the cream or ointment within 15 minutes of showering. Reapply moisturizer at night, and moisturize your hands every time after you wash them.  Hair Loss (if your doctor says your hair will fall out)  . If your doctor says that your hair is likely to fall out, decide before you begin chemo whether you want to wear a wig. You may want to shop before treatment to match your hair color. . Hats, turbans, and scarves can also camouflage hair loss, although some people prefer to leave their heads uncovered. If you go bare-headed outdoors, be sure to use sunscreen on your scalp. . Cut your hair short. It eases the inconvenience of shedding lots of hair, but it also can reduce the emotional impact of watching your hair fall out. . Don't perm or color your hair during chemotherapy. Those chemical treatments are already damaging to hair and can enhance hair loss. Once your chemo treatments are done and your hair has grown back, it's OK to resume dyeing or perming hair. With chemotherapy, hair loss is almost always temporary. But when it grows back, it may be a different color or texture. In older adults who still had hair color before chemotherapy, the new growth may be completely gray.  Often, new hair is very fine and soft.  Infection Prevention Please wash your hands for at least 30 seconds using warm soapy water. Handwashing is the #1 way to prevent the spread of germs. Stay away from sick people or people who are getting over a cold. If  you develop respiratory systems such as green/yellow mucus production or productive cough or persistent cough let us know and we will see if you need an antibiotic. It is a good idea to keep a pair of gloves on when going into grocery stores/[employer] to decrease your risk of coming into contact with germs on the carts, etc. Carry alcohol hand gel with you at all times and use it frequently if out in public. If your temperature reaches 100.5 or higher please call the clinic and let us know.  If it is after hours or on the weekend please go to the ER if your temperature is  over 100.5.  Please have your own personal thermometer at home to use.    Sex and bodily fluids If you are going to have sex, a condom must be used to protect the person that isn't taking chemotherapy. Chemo can decrease your libido (sex drive). For a few days after chemotherapy, chemotherapy can be excreted through your bodily fluids.  When using the toilet please close the lid and flush the toilet twice.  Do this for a few day after you have had chemotherapy.     Effects of chemotherapy on your sex life Some changes are simple and won't last long. They won't affect your sex life permanently. Sometimes you may feel: . too tired . not strong enough to be very active . sick or sore  . not in the mood . anxious or low Your anxiety might not seem related to sex. For example, you may be worried about the cancer and how your treatment is going. Or you may be worried about money, or about how you family are coping with your illness. These things can cause stress, which can affect your interest in sex. It's important to talk to your partner about how you feel. Remember - the changes to your sex life don't usually last long. There's usually no medical reason to stop having sex during chemo. The drugs won't have any long term physical effects on your performance or enjoyment of sex. Cancer can't be passed on to your partner during  sex  Contraception It's important to use reliable contraception during treatment. Avoid getting pregnant while you or your partner are having chemotherapy. This is because the drugs may harm the baby. Sometimes chemotherapy drugs can leave a man or woman infertile.  This means you would not be able to have children in the future. You might want to talk to someone about permanent infertility. It can be very difficult to learn that you may no longer be able to have children. Some people find counselling helpful. There might be ways to preserve your fertility, although this is easier for men than for women. You may want to speak to a fertility expert. You can talk about sperm banking or harvesting your eggs. You can also ask about other fertility options, such as donor eggs. If you have or have had breast cancer, your doctor might advise you not to take the contraceptive pill. This is because the hormones in it might affect the cancer.  It is not known for sure whether or not chemotherapy drugs can be passed on through semen or secretions from the vagina. Because of this some doctors advise people to use a barrier method if you have sex during treatment. This applies to vaginal, anal or oral sex. Generally, doctors advise a barrier method only for the time you are actually having the treatment and for about a week after your treatment. Advice like this can be worrying, but this does not mean that you have to avoid being intimate with your partner. You can still have close contact with your partner and continue to enjoy sex.  Animals If you have cats or birds we just ask that you not change the litter or change the cage.  Please have someone else do this for you while you are on chemotherapy.   Food Safety During and After Cancer Treatment Food safety is important for people both during and after cancer treatment. Cancer and cancer treatments, such as chemotherapy, radiation therapy, and stem cell/bone  marrow transplantation, often weaken  the immune system. This makes it harder for your body to protect itself from foodborne illness, also called food poisoning. Foodborne illness is caused by eating food that contains harmful bacteria, parasites, or viruses.  Foods to avoid Some foods have a higher risk of becoming tainted with bacteria. These include: Marland Kitchen Unwashed fresh fruit and vegetables, especially leafy vegetables that can hide dirt and other contaminants . Raw sprouts, such as alfalfa sprouts . Raw or undercooked beef, especially ground beef, or other raw or undercooked meat and poultry . Fatty, fried, or spicy foods immediately before or after treatment.  These can sit heavy on your stomach and make you feel nauseous. . Raw or undercooked shellfish, such as oysters. . Sushi and sashimi, which often contain raw fish.  . Unpasteurized beverages, such as unpasteurized fruit juices, raw milk, raw yogurt, or cider . Undercooked eggs, such as soft boiled, over easy, and poached; raw, unpasteurized eggs; or foods made with raw egg, such as homemade raw cookie dough and homemade mayonnaise Simple steps for food safety Shop smart. . Do not buy food stored or displayed in an unclean area. . Do not buy bruised or damaged fruits or vegetables. . Do not buy cans that have cracks, dents, or bulges. . Pick up foods that can spoil at the end of your shopping trip and store them in a cooler on the way home. Prepare and clean up foods carefully. . Rinse all fresh fruits and vegetables under running water, and dry them with a clean towel or paper towel. . Clean the top of cans before opening them. . After preparing food, wash your hands for 20 seconds with hot water and soap. Pay special attention to areas between fingers and under nails. . Clean your utensils and dishes with hot water and soap. Marland Kitchen Disinfect your kitchen and cutting boards using 1 teaspoon of liquid, unscented bleach mixed into 1 quart of  water.   Dispose of old food. . Eat canned and packaged food before its expiration date (the "use by" or "best before" date). . Consume refrigerated leftovers within 3 to 4 days. After that time, throw out the food. Even if the food does not smell or look spoiled, it still may be unsafe. Some bacteria, such as Listeria, can grow even on foods stored in the refrigerator if they are kept for too long. Take precautions when eating out. . At restaurants, avoid buffets and salad bars where food sits out for a long time and comes in contact with many people. Food can become contaminated when someone with a virus, often a norovirus, or another "bug" handles it. . Put any leftover food in a "to-go" container yourself, rather than having the server do it. And, refrigerate leftovers as soon as you get home. . Choose restaurants that are clean and that are willing to prepare your food as you order it cooked.    MEDICATIONS:  Zofran/Ondansetron 8mg  tablet. Take 1 tablet every 8 hours as needed for nausea/vomiting. (#1 nausea med to take, this can constipate)  Compazine/Prochlorperazine 10mg  tablet. Take 1 tablet every 6 hours as needed for nausea/vomiting. (#2 nausea med to take, this can make you sleepy)   EMLA cream. Apply a quarter size amount to port site 1 hour prior to chemo. Do not rub in. Cover with plastic wrap.   Over-the-Counter Meds:  Miralax 1 capful in 8 oz of fluid daily. May increase to two times a day if needed. This is a stool softener. If  this doesn't work proceed you can add:  Senokot S-start with 1 tablet two times a day and increase to 4 tablets two times a day if needed. (total of 8 tablets in a 24 hour period). This is a stimulant laxative.   Call us if this does not help your bowels move.   Imodium 2mg  capsule. Take 2 capsules after the 1st loose stool and then 1 capsule every 2 hours until you go a total of 12 hours without having a loose stool. Call the [facility] if  loose stools continue. If diarrhea occurs @ bedtime, take 2 capsules @ bedtime. Then take 2 capsules every 4 hours until morning. Call [facility].     Diarrhea Sheet  If you are having loose stools/diarrhea, please purchase Imodium and begin taking as outlined:  At the first sign of poorly formed or loose stools you should begin taking Imodium(loperamide) 2 mg capsules.  Take two caplets (4mg ) followed by one caplet (2mg ) every 2 hours until you have had no diarrhea for 12 hours.  During the night take two caplets (4mg ) at bedtime and continue every 4 hours during the night until the morning.  Stop taking Imodium only after there is no sign of diarrhea for 12 hours.    Always call the [facility] if you are having loose stools/diarrhea that you can't get under control.  Loose stools/disrrhea leads to dehydration (loss of water) in your body.  We have other options of trying to get the loose stools/diarrhea to stopped but you must let us know!     Constipation Sheet *Miralax in 8 oz of fluid daily.  May increase to two times a day if needed.  This is a stool softener.  If this not enough to keep your bowel regular:  You can add:  *Senokot S, start with one tablet twice a day and can increase to 4 tablets twice a day if needed.  This is a stimulant laxative.   Sometimes when you take pain medication you need BOTH a medicine to keep your stool soft and a medicine to help your bowel push it out!  Please call if the above does not work for you.   Do not go more than 2 days without a bowel movement.  It is very important that you do not become constipated.  It will make you feel sick to your stomach (nausea) and can cause abdominal pain and vomiting.     Nausea Sheet  Zofran/Ondansetron 8mg  tablet. Take 1 tablet every 8 hours as needed for nausea/vomiting. (#1 nausea med to take, this can constipate)  Compazine/Prochlorperazine 10mg  tablet. Take 1 tablet every 6 hours as needed for  nausea/vomiting. (#2 nausea med to take, this can make you sleepy)  You can take these medications together or separately.  We would first like for you to try the Ondansetron by itself and then take the Prochloperizine if needed. But you are allowed to take both medications at the same time if your nausea is that severe.  If you are having persistent nausea (nausea that does not stop) please take these medications on a staggered schedule so that the nausea medication stays in your body.  Please call the [facility] and let us know the amount of nausea that you are experiencing.  If you begin to vomit, you need to call the [facility] and if it is the weekend and you have vomited more than one time and cant get it to stop-go to  the Emergency Room.  Persistent nausea/vomiting can lead to dehydration (loss of fluid in your body) and will make you feel terrible.   Ice chips, sips of clear liquids, foods that are @ room temperature, crackers, and toast tend to be better tolerated.    SYMPTOMS TO REPORT AS SOON AS POSSIBLE AFTER TREATMENT:  FEVER GREATER THAN 100.5 F  CHILLS WITH OR WITHOUT FEVER  NAUSEA AND VOMITING THAT IS NOT CONTROLLED WITH YOUR NAUSEA MEDICATION  UNUSUAL SHORTNESS OF BREATH  UNUSUAL BRUISING OR BLEEDING  TENDERNESS IN MOUTH AND THROAT WITH OR WITHOUT PRESENCE OF ULCERS  URINARY PROBLEMS  BOWEL PROBLEMS  UNUSUAL RASH    Wear comfortable clothing and clothing appropriate for easy access to any Portacath or PICC line. Let us know if there is anything that we can do to make your therapy better!    What to do if you need assistance after hours or on the weekends: CALL [phone].  HOLD on the line, do not hang up.  You will hear multiple messages but at the end you will be connected with a nurse triage line.  They will contact the doctor if necessary.  Most of the time they will be able to assist you.  Do not call the hospital operator.      I have been informed  and understand all of the instructions given to me and have received a copy. I have been instructed to call the clinic [phone] or my family physician as soon as possible for continued medical care, if indicated. I do not have any more questions at this time but understand that I may call the [facility] or the Patient Navigator at [phone] during office hours should I have questions or need assistance in obtaining follow-up care.

## 2016-09-22 NOTE — Patient Instructions (Signed)
[place] at [facility] Discharge Instructions  RECOMMENDATIONS MADE BY THE CONSULTANT AND ANY TEST RESULTS WILL BE SENT TO YOUR REFERRING PHYSICIAN.  You were seen today by Dr. Talbert Cage.   Thank you for choosing [place] at [facility] to provide your oncology and hematology care.  To afford each patient quality time with our provider, please arrive at least 15 minutes before your scheduled appointment time.    If you have a lab appointment with the [facility] please come in thru the  Main Entrance and check in at the main information desk  You need to re-schedule your appointment should you arrive 10 or more minutes late.  We strive to give you quality time with our providers, and arriving late affects you and other patients whose appointments are after yours.  Also, if you no show three or more times for appointments you may be dismissed from the clinic at the providers discretion.     Again, thank you for choosing [facility].  Our hope is that these requests will decrease the amount of time that you wait before being seen by our physicians.       _____________________________________________________________  Should you have questions after your visit to [facility], please contact our office at [phone] between the hours of 8:30 a.m. and 4:30 p.m.  Voicemails left after 4:30 p.m. will not be returned until the following business day.  For prescription refill requests, have your pharmacy contact our office.       Resources For Cancer Patients and their Caregivers ? American Cancer Society: Can assist with transportation, wigs, general needs, runs Look Good Feel Better.        475 278 0421 ? Cancer Care: Provides financial assistance, online support groups, medication/co-pay assistance.  1-800-813-HOPE 209-324-8233) ? [place] Assists [place] cancer patients and their families  through emotional , educational and financial support.  254-509-7955 ? [place] DSS Where to apply for food stamps, Medicaid and utility assistance. 724-784-4099 ? [employer]: Transportation to medical appointments. 930-192-6777 ? Social Security Administration: May apply for disability if have a Stage IV cancer. 8787654221 8053994576 ? [employer], Disability and Transit Services: Assists with nutrition, care and transit needs. [place] Support Programs: @10RELATIVEDAYS @ > Cancer Support Group  2nd Tuesday of the month 1pm-2pm, Journey Room  > Creative Journey  3rd Tuesday of the month 1130am-1pm, Journey Room  > Look Good Feel Better  1st Wednesday of the month 10am-12 noon, Journey Room (Call [place] to register [phone])

## 2016-09-23 DIAGNOSIS — C3411 Malignant neoplasm of upper lobe, right bronchus or lung: Secondary | ICD-10-CM | POA: Diagnosis not present

## 2016-09-23 DIAGNOSIS — Z510 Encounter for antineoplastic radiation therapy: Secondary | ICD-10-CM | POA: Diagnosis not present

## 2016-09-23 NOTE — Telephone Encounter (Signed)
Notes recorded by Laurine Blazer, LPN on 12/29/2681 at 4:19 AM EDT Attempted to notify patient again. Left detailed message on voice mail. Will put in recall & let schedulers know about the repeat echo. Copy to pmd. ------

## 2016-09-25 ENCOUNTER — Other Ambulatory Visit (HOSPITAL_COMMUNITY): Payer: Self-pay | Admitting: Oncology

## 2016-09-26 ENCOUNTER — Other Ambulatory Visit: Payer: Self-pay | Admitting: Hematology

## 2016-09-26 ENCOUNTER — Encounter (HOSPITAL_BASED_OUTPATIENT_CLINIC_OR_DEPARTMENT_OTHER): Payer: Medicare Other

## 2016-09-26 ENCOUNTER — Encounter (HOSPITAL_COMMUNITY): Payer: Self-pay

## 2016-09-26 VITALS — BP 111/60 | HR 100 | Temp 98.2°F | Resp 18 | Wt 169.4 lb

## 2016-09-26 DIAGNOSIS — R739 Hyperglycemia, unspecified: Secondary | ICD-10-CM | POA: Diagnosis not present

## 2016-09-26 DIAGNOSIS — Z5111 Encounter for antineoplastic chemotherapy: Secondary | ICD-10-CM | POA: Diagnosis not present

## 2016-09-26 DIAGNOSIS — R0602 Shortness of breath: Secondary | ICD-10-CM | POA: Diagnosis not present

## 2016-09-26 DIAGNOSIS — Z5112 Encounter for antineoplastic immunotherapy: Secondary | ICD-10-CM

## 2016-09-26 DIAGNOSIS — C3491 Malignant neoplasm of unspecified part of right bronchus or lung: Secondary | ICD-10-CM

## 2016-09-26 DIAGNOSIS — R300 Dysuria: Secondary | ICD-10-CM | POA: Diagnosis not present

## 2016-09-26 LAB — COMPREHENSIVE METABOLIC PANEL
ALBUMIN: 3.4 g/dL — AB (ref 3.5–5.0)
ALK PHOS: 79 U/L (ref 38–126)
ALT: 15 U/L — ABNORMAL LOW (ref 17–63)
ANION GAP: 8 (ref 5–15)
AST: 18 U/L (ref 15–41)
BILIRUBIN TOTAL: 0.5 mg/dL (ref 0.3–1.2)
BUN: 17 mg/dL (ref 6–20)
CALCIUM: 9.2 mg/dL (ref 8.9–10.3)
CO2: 30 mmol/L (ref 22–32)
Chloride: 100 mmol/L — ABNORMAL LOW (ref 101–111)
Creatinine, Ser: 0.76 mg/dL (ref 0.61–1.24)
GFR calc Af Amer: >60 mL/min (ref >60)
GLUCOSE: 189 mg/dL — AB (ref 65–99)
Potassium: 3.2 mmol/L — ABNORMAL LOW (ref 3.5–5.1)
Sodium: 138 mmol/L (ref 135–145)
TOTAL PROTEIN: 6.7 g/dL (ref 6.5–8.1)

## 2016-09-26 LAB — CBC WITH DIFFERENTIAL/PLATELET
BASOS PCT: 0 %
Basophils Absolute: 0 10*3/uL (ref 0.0–0.1)
Eosinophils Absolute: 0.2 10*3/uL (ref 0.0–0.7)
Eosinophils Relative: 3 %
HEMATOCRIT: 31.3 % — AB (ref 39.0–52.0)
HEMOGLOBIN: 10.9 g/dL — AB (ref 13.0–17.0)
LYMPHS ABS: 0.4 10*3/uL — AB (ref 0.7–4.0)
LYMPHS PCT: 6 %
MCH: 36.1 pg — ABNORMAL HIGH (ref 26.0–34.0)
MCHC: 34.8 g/dL (ref 30.0–36.0)
MCV: 103.6 fL — AB (ref 78.0–100.0)
MONO ABS: 1 10*3/uL (ref 0.1–1.0)
MONOS PCT: 13 %
NEUTROS PCT: 78 %
Neutro Abs: 5.7 10*3/uL (ref 1.7–7.7)
Platelets: 365 10*3/uL (ref 150–400)
RBC: 3.02 MIL/uL — ABNORMAL LOW (ref 4.22–5.81)
RDW: 17.1 % — AB (ref 11.5–15.5)
WBC: 7.3 10*3/uL (ref 4.0–10.5)

## 2016-09-26 MED ORDER — SODIUM CHLORIDE 0.9 % IV SOLN
Freq: Once | INTRAVENOUS | Status: AC
  Administered 2016-09-26: 13:00:00 via INTRAVENOUS

## 2016-09-26 MED ORDER — SODIUM CHLORIDE 0.9% FLUSH
10.0000 mL | INTRAVENOUS | Status: DC | PRN
  Administered 2016-09-26 (×2): 10 mL
  Filled 2016-09-26 (×2): qty 10

## 2016-09-26 MED ORDER — HEPARIN SOD (PORK) LOCK FLUSH 100 UNIT/ML IV SOLN
500.0000 [IU] | Freq: Once | INTRAVENOUS | Status: AC | PRN
  Administered 2016-09-26: 500 [IU]
  Filled 2016-09-26: qty 5

## 2016-09-26 MED ORDER — DURVALUMAB 500 MG/10ML IV SOLN: 10.0000 mg/kg | Freq: Once | INTRAVENOUS | Status: DC

## 2016-09-26 MED ORDER — DURVALUMAB 500 MG/10ML IV SOLN
9.5000 mg/kg | Freq: Once | INTRAVENOUS | Status: AC
  Administered 2016-09-26: 740 mg via INTRAVENOUS
  Filled 2016-09-26: qty 10

## 2016-09-26 NOTE — Patient Instructions (Signed)
[facility] Discharge Instructions for Patients Receiving Chemotherapy   Beginning January 23rd 2017 lab work for the [facility] will be done in the  Main lab at [facility] on 1st floor. If you have a lab appointment with the [facility] please come in thru the  Main Entrance and check in at the main information desk   Today you received the following chemotherapy agents Imfinzi. Follow-up as scheduled. Call clinic for any questions or concerns  To help prevent nausea and vomiting after your treatment, we encourage you to take your nausea medication   If you develop nausea and vomiting, or diarrhea that is not controlled by your medication, call the clinic.  The clinic phone number is [phone]. Office hours are Monday-Friday 8:30am-5:00pm.  BELOW ARE SYMPTOMS THAT SHOULD BE REPORTED IMMEDIATELY:  *FEVER GREATER THAN 101.0 F  *CHILLS WITH OR WITHOUT FEVER  NAUSEA AND VOMITING THAT IS NOT CONTROLLED WITH YOUR NAUSEA MEDICATION  *UNUSUAL SHORTNESS OF BREATH  *UNUSUAL BRUISING OR BLEEDING  TENDERNESS IN MOUTH AND THROAT WITH OR WITHOUT PRESENCE OF ULCERS  *URINARY PROBLEMS  *BOWEL PROBLEMS  UNUSUAL RASH Items with * indicate a potential emergency and should be followed up as soon as possible. If you have an emergency after office hours please contact your primary care physician or go to the nearest emergency department.  Please call the clinic during office hours if you have any questions or concerns.   You may also contact the Patient Navigator at [phone] should you have any questions or need assistance in obtaining follow up care.      Resources For Cancer Patients and their Caregivers ? American Cancer Society: Can assist with transportation, wigs, general needs, runs Look Good Feel Better.        769 235 8261 ? Cancer Care: Provides financial assistance, online support groups, medication/co-pay assistance.  1-800-813-HOPE  305-562-7817) ? [place] Assists [place] cancer patients and their families through emotional , educational and financial support.  6146714500 ? [place] DSS Where to apply for food stamps, Medicaid and utility assistance. 651-561-3409 ? [employer]: Transportation to medical appointments. (647) [phone] ? Social Security Administration: May apply for disability if have a Stage IV cancer. 408-012-7571 231-377-2845 ? [employer], Disability and Transit Services: Assists with nutrition, care and transit needs. 587-873-2637

## 2016-09-26 NOTE — Progress Notes (Signed)
John [name] tolerated Imfinzi infusion well without complaints or incident. VSS throughout infusion and upon discharge. Labs reviewed prior to administering this medication. Pt discharged self ambulatory in satisfactory condition accompanied by his wife

## 2016-09-29 ENCOUNTER — Telehealth (HOSPITAL_COMMUNITY): Payer: Self-pay

## 2016-09-29 ENCOUNTER — Other Ambulatory Visit: Payer: Self-pay | Admitting: Family Medicine

## 2016-09-29 NOTE — Telephone Encounter (Signed)
See telephone encounter note.

## 2016-10-02 DIAGNOSIS — L97511 Non-pressure chronic ulcer of other part of right foot limited to breakdown of skin: Secondary | ICD-10-CM | POA: Diagnosis not present

## 2016-10-10 ENCOUNTER — Encounter (HOSPITAL_COMMUNITY): Payer: Medicare Other | Attending: Oncology

## 2016-10-10 ENCOUNTER — Encounter (HOSPITAL_COMMUNITY): Payer: Self-pay

## 2016-10-10 VITALS — BP 126/66 | HR 101 | Temp 97.9°F | Resp 18 | Wt 173.0 lb

## 2016-10-10 DIAGNOSIS — C3491 Malignant neoplasm of unspecified part of right bronchus or lung: Secondary | ICD-10-CM | POA: Diagnosis not present

## 2016-10-10 DIAGNOSIS — R0602 Shortness of breath: Secondary | ICD-10-CM | POA: Diagnosis not present

## 2016-10-10 DIAGNOSIS — Z5111 Encounter for antineoplastic chemotherapy: Secondary | ICD-10-CM | POA: Insufficient documentation

## 2016-10-10 DIAGNOSIS — Z5112 Encounter for antineoplastic immunotherapy: Secondary | ICD-10-CM

## 2016-10-10 DIAGNOSIS — R739 Hyperglycemia, unspecified: Secondary | ICD-10-CM | POA: Diagnosis not present

## 2016-10-10 DIAGNOSIS — R300 Dysuria: Secondary | ICD-10-CM | POA: Insufficient documentation

## 2016-10-10 LAB — CBC WITH DIFFERENTIAL/PLATELET
BASOS ABS: 0 10*3/uL (ref 0.0–0.1)
Basophils Relative: 0 %
Eosinophils Absolute: 0.2 10*3/uL (ref 0.0–0.7)
Eosinophils Relative: 2 %
HEMATOCRIT: 35.4 % — AB (ref 39.0–52.0)
Hemoglobin: 11.9 g/dL — ABNORMAL LOW (ref 13.0–17.0)
LYMPHS PCT: 7 %
Lymphs Abs: 0.6 10*3/uL — ABNORMAL LOW (ref 0.7–4.0)
MCH: 34.8 pg — ABNORMAL HIGH (ref 26.0–34.0)
MCHC: 33.6 g/dL (ref 30.0–36.0)
MCV: 103.5 fL — AB (ref 78.0–100.0)
Monocytes Absolute: 1 10*3/uL (ref 0.1–1.0)
Monocytes Relative: 13 %
NEUTROS ABS: 6.2 10*3/uL (ref 1.7–7.7)
NEUTROS PCT: 78 %
PLATELETS: 273 10*3/uL (ref 150–400)
RBC: 3.42 MIL/uL — AB (ref 4.22–5.81)
RDW: 15.4 % (ref 11.5–15.5)
WBC: 8 10*3/uL (ref 4.0–10.5)

## 2016-10-10 LAB — COMPREHENSIVE METABOLIC PANEL
ALBUMIN: 3.6 g/dL (ref 3.5–5.0)
ALT: 14 U/L — AB (ref 17–63)
AST: 18 U/L (ref 15–41)
Alkaline Phosphatase: 76 U/L (ref 38–126)
Anion gap: 8 (ref 5–15)
BUN: 17 mg/dL (ref 6–20)
CHLORIDE: 100 mmol/L — AB (ref 101–111)
CO2: 30 mmol/L (ref 22–32)
CREATININE: 0.65 mg/dL (ref 0.61–1.24)
Calcium: 9.2 mg/dL (ref 8.9–10.3)
GFR calc Af Amer: >60 mL/min (ref >60)
GLUCOSE: 142 mg/dL — AB (ref 65–99)
Potassium: 3.6 mmol/L (ref 3.5–5.1)
Sodium: 138 mmol/L (ref 135–145)
Total Bilirubin: 0.5 mg/dL (ref 0.3–1.2)
Total Protein: 6.9 g/dL (ref 6.5–8.1)

## 2016-10-10 MED ORDER — DURVALUMAB 500 MG/10ML IV SOLN
10.0000 mg/kg | Freq: Once | INTRAVENOUS | Status: AC
  Administered 2016-10-10: 780 mg via INTRAVENOUS
  Filled 2016-10-10: qty 5.6

## 2016-10-10 MED ORDER — SODIUM CHLORIDE 0.9 % IV SOLN
Freq: Once | INTRAVENOUS | Status: AC
  Administered 2016-10-10: 12:00:00 via INTRAVENOUS

## 2016-10-10 MED ORDER — ONDANSETRON HCL 8 MG PO TABS: 8.0000 mg | ORAL_TABLET | Freq: Three times a day (TID) | ORAL | 1 refills | Status: DC | PRN

## 2016-10-10 MED ORDER — HEPARIN SOD (PORK) LOCK FLUSH 100 UNIT/ML IV SOLN
500.0000 [IU] | Freq: Once | INTRAVENOUS | Status: AC | PRN
  Administered 2016-10-10: 500 [IU]
  Filled 2016-10-10: qty 5

## 2016-10-10 NOTE — Progress Notes (Signed)
Tolerated infusion w/o adverse reaction.  Alert, in no distress.  VSS.  Discharged ambulatory in c/o spouse.  

## 2016-10-15 ENCOUNTER — Other Ambulatory Visit: Payer: Self-pay | Admitting: Family Medicine

## 2016-10-17 ENCOUNTER — Ambulatory Visit (INDEPENDENT_AMBULATORY_CARE_PROVIDER_SITE_OTHER): Payer: Medicare Other | Admitting: Urology

## 2016-10-17 DIAGNOSIS — R3912 Poor urinary stream: Secondary | ICD-10-CM

## 2016-10-17 DIAGNOSIS — N401 Enlarged prostate with lower urinary tract symptoms: Secondary | ICD-10-CM

## 2016-10-23 DIAGNOSIS — L97511 Non-pressure chronic ulcer of other part of right foot limited to breakdown of skin: Secondary | ICD-10-CM | POA: Diagnosis not present

## 2016-10-24 ENCOUNTER — Encounter (HOSPITAL_COMMUNITY): Payer: Self-pay

## 2016-10-24 ENCOUNTER — Encounter (HOSPITAL_BASED_OUTPATIENT_CLINIC_OR_DEPARTMENT_OTHER): Payer: Medicare Other | Admitting: Oncology

## 2016-10-24 ENCOUNTER — Other Ambulatory Visit: Payer: Self-pay | Admitting: Family Medicine

## 2016-10-24 ENCOUNTER — Encounter (HOSPITAL_BASED_OUTPATIENT_CLINIC_OR_DEPARTMENT_OTHER): Payer: Medicare Other

## 2016-10-24 ENCOUNTER — Other Ambulatory Visit (HOSPITAL_COMMUNITY): Payer: Self-pay | Admitting: Oncology

## 2016-10-24 VITALS — BP 163/68 | HR 65 | Temp 97.9°F | Resp 16 | Wt 173.9 lb

## 2016-10-24 VITALS — BP 149/69 | HR 96 | Temp 97.5°F | Resp 18

## 2016-10-24 DIAGNOSIS — R110 Nausea: Secondary | ICD-10-CM | POA: Diagnosis not present

## 2016-10-24 DIAGNOSIS — R739 Hyperglycemia, unspecified: Secondary | ICD-10-CM | POA: Diagnosis not present

## 2016-10-24 DIAGNOSIS — Z5112 Encounter for antineoplastic immunotherapy: Secondary | ICD-10-CM | POA: Diagnosis present

## 2016-10-24 DIAGNOSIS — C3491 Malignant neoplasm of unspecified part of right bronchus or lung: Secondary | ICD-10-CM

## 2016-10-24 DIAGNOSIS — R0602 Shortness of breath: Secondary | ICD-10-CM | POA: Diagnosis not present

## 2016-10-24 DIAGNOSIS — R300 Dysuria: Secondary | ICD-10-CM | POA: Diagnosis not present

## 2016-10-24 DIAGNOSIS — R530 Neoplastic (malignant) related fatigue: Secondary | ICD-10-CM | POA: Diagnosis not present

## 2016-10-24 DIAGNOSIS — Z5111 Encounter for antineoplastic chemotherapy: Secondary | ICD-10-CM | POA: Diagnosis not present

## 2016-10-24 LAB — CBC WITH DIFFERENTIAL/PLATELET
BASOS ABS: 0 10*3/uL (ref 0.0–0.1)
Basophils Relative: 0 %
Eosinophils Absolute: 0.2 10*3/uL (ref 0.0–0.7)
Eosinophils Relative: 2 %
HEMATOCRIT: 36.6 % — AB (ref 39.0–52.0)
Hemoglobin: 12.4 g/dL — ABNORMAL LOW (ref 13.0–17.0)
LYMPHS ABS: 0.5 10*3/uL — AB (ref 0.7–4.0)
LYMPHS PCT: 7 %
MCH: 35.3 pg — AB (ref 26.0–34.0)
MCHC: 33.9 g/dL (ref 30.0–36.0)
MCV: 104.3 fL — AB (ref 78.0–100.0)
MONO ABS: 0.7 10*3/uL (ref 0.1–1.0)
Monocytes Relative: 11 %
NEUTROS ABS: 5.4 10*3/uL (ref 1.7–7.7)
Neutrophils Relative %: 80 %
Platelets: 295 10*3/uL (ref 150–400)
RBC: 3.51 MIL/uL — ABNORMAL LOW (ref 4.22–5.81)
RDW: 14.2 % (ref 11.5–15.5)
WBC: 6.8 10*3/uL (ref 4.0–10.5)

## 2016-10-24 LAB — COMPREHENSIVE METABOLIC PANEL
ALT: 12 U/L — AB (ref 17–63)
AST: 17 U/L (ref 15–41)
Albumin: 3.6 g/dL (ref 3.5–5.0)
Alkaline Phosphatase: 78 U/L (ref 38–126)
Anion gap: 9 (ref 5–15)
BUN: 14 mg/dL (ref 6–20)
CALCIUM: 9.1 mg/dL (ref 8.9–10.3)
CO2: 29 mmol/L (ref 22–32)
CREATININE: 0.73 mg/dL (ref 0.61–1.24)
Chloride: 101 mmol/L (ref 101–111)
GFR calc Af Amer: >60 mL/min (ref >60)
Glucose, Bld: 136 mg/dL — ABNORMAL HIGH (ref 65–99)
Potassium: 3.4 mmol/L — ABNORMAL LOW (ref 3.5–5.1)
Sodium: 139 mmol/L (ref 135–145)
Total Bilirubin: 0.4 mg/dL (ref 0.3–1.2)
Total Protein: 7 g/dL (ref 6.5–8.1)

## 2016-10-24 MED ORDER — SODIUM CHLORIDE 0.9 % IV SOLN
10.0000 mg/kg | Freq: Once | INTRAVENOUS | Status: AC
  Administered 2016-10-24: 780 mg via INTRAVENOUS
  Filled 2016-10-24: qty 5.6

## 2016-10-24 MED ORDER — TRAZODONE HCL 50 MG PO TABS: 50.0000 mg | ORAL_TABLET | Freq: Every evening | ORAL | 0 refills | Status: DC | PRN

## 2016-10-24 MED ORDER — SODIUM CHLORIDE 0.9 % IV SOLN
Freq: Once | INTRAVENOUS | Status: AC
  Administered 2016-10-24: 12:00:00 via INTRAVENOUS

## 2016-10-24 MED ORDER — SODIUM CHLORIDE 0.9% FLUSH
10.0000 mL | INTRAVENOUS | Status: DC | PRN
  Administered 2016-10-24: 10 mL
  Filled 2016-10-24: qty 10

## 2016-10-24 MED ORDER — HEPARIN SOD (PORK) LOCK FLUSH 100 UNIT/ML IV SOLN
500.0000 [IU] | Freq: Once | INTRAVENOUS | Status: AC | PRN
Start: 2016-10-24 — End: 2016-10-24
  Administered 2016-10-24: 500 [IU]

## 2016-10-24 NOTE — Patient Instructions (Signed)
[facility] at [facility] Discharge Instructions  RECOMMENDATIONS MADE BY THE CONSULTANT AND ANY TEST RESULTS WILL BE SENT TO YOUR REFERRING PHYSICIAN.  You saw Dr. Zhou today.  Thank you for choosing [facility] at [facility] to provide your oncology and hematology care.  To afford each patient quality time with our provider, please arrive at least 15 minutes before your scheduled appointment time.    If you have a lab appointment with the Cancer Center please come in thru the  Main Entrance and check in at the main information desk  You need to re-schedule your appointment should you arrive 10 or more minutes late.  We strive to give you quality time with our providers, and arriving late affects you and other patients whose appointments are after yours.  Also, if you no show three or more times for appointments you may be dismissed from the clinic at the providers discretion.     Again, thank you for choosing [facility].  Our hope is that these requests will decrease the amount of time that you wait before being seen by our physicians.       _____________________________________________________________  Should you have questions after your visit to [facility], please contact our office at [phone] between the hours of 8:30 a.m. and 4:30 p.m.  Voicemails left after 4:30 p.m. will not be returned until the following business day.  For prescription refill requests, have your pharmacy contact our office.       Resources For Cancer Patients and their Caregivers ? American Cancer Society: Can assist with transportation, wigs, general needs, runs Look Good Feel Better.        1-888-227-6333 ? Cancer Care: Provides financial assistance, online support groups, medication/co-pay assistance.  1-800-813-HOPE (4673) ? [facility] Assists [place] cancer patients and their families through  emotional , educational and financial support.  336-427-4357 ? [place] DSS Where to apply for food stamps, Medicaid and utility assistance. 336-342-1394 ? [employer]: Transportation to medical appointments. 336-347-2287 ? Social Security Administration: May apply for disability if have a Stage IV cancer. 336-342-7796 1-800-772-1213 ? [place] [employer], Disability and Transit Services: Assists with nutrition, care and transit needs. 336-349-2343  Cancer Center Support Programs: @10RELATIVEDAYS@ > Cancer Support Group  2nd Tuesday of the month 1pm-2pm, Journey Room  > Creative Journey  3rd Tuesday of the month 1130am-1pm, Journey Room  > Look Good Feel Better  1st Wednesday of the month 10am-12 noon, Journey Room (Call [employer] to register 1-800-395-5775)    

## 2016-10-24 NOTE — Progress Notes (Signed)
[facility] Hematology/Oncology Progress Note  Name: Melroy Bougher      MRN: [id number]   Date: 10/24/2016 Time:12:41 PM   REFERRING PHYSICIAN:  [name], MD (Primary Care Provider)  REASON FOR CONSULT:  Pulmonary lesion(s)   DIAGNOSIS:  Multifocal RUL pulmonary lesions, 1.7 cm and 2.0 cm with RIGHT hilar and paratracheal lymphadenopathy with a LUL pulmonary lesion with low hypermetabolic activity measuring 1.1 cm.    Non-small cell lung cancer, right ([place])   04/17/2016 Imaging    CT chest- 1. Examination is positive for bilateral upper lobe pulmonary lesions suspicious for malignancy. Further investigation with PET-CT and tissue sampling is advised. 2. Borderline enlarged paratracheal lymph nodes and right hilar nodes. Cannot rule out metastatic adenopathy. Attention to these areas on PET-CT is advised. 3. Emphysema 4. Aortic atherosclerosis and multi vessel coronary artery calcification.      04/18/2016 Initial Diagnosis    Adenocarcinoma of lung, right ([place])     04/24/2016 PET scan    1. Adjacent hypermetabolic nodules in the RIGHT upper lobe consistent bronchogenic carcinoma. 2. Suspicion of ipsilateral hilar and paratracheal nodal metastasis. 3. Nodule in the LEFT upper lobe with suspicious morphology has a relatively low metabolic activity for size. Favor synchronous primary bronchogenic carcinoma. 4. No metabolic activity associated with a small LEFT lower lobe pulmonary nodule. Recommend attention on follow-up.      05/06/2016 Imaging    MRI brain- Negative for metastatic disease.      05/28/2016 Procedure    1. Electromagnetic navigational bronchoscopy with brushings, needle     aspirations and biopsies of bilateral upper lobe nodules. 2. Endobronchial ultrasound with mediastinal and hilar lymph node     aspirations. By Dr. [name]      06/03/2016 Pathology Results    Diagnosis 1. Lung, biopsy, Right upper lobe - BLOOD AND BENIGN  BRONCHIAL AND LUNG TISSUE. 2. Lung, biopsy, Left upper lobe - BLOOD AND BENIGN BRONCHIAL AND LUNG TISSUE.      06/03/2016 Pathology Results    Diagnosis TRANSBRONCHIAL NEEDLE ASPIRATION, NAVIGATION, RUL (SPECIMEN 5 OF 7, COLLECTED 05/28/16): MALIGNANT CELLS CONSISTENT WITH NON-SMALL CELL CARCINOMA Comment There are limited tumor cells in the cell block (insufficient for molecular testing). TTF-1 is positive in a few of the atypical cells. They appear negative for synaptophysin and cytokeratin 5/6. Overall there is limited tumor, but a lung adenocarcinoma is slightly favored. Dr. [name] has reviewed the case. The case was called to Dr. [name] on 06/03/2016.      06/13/2016 Procedure    Port placed by Dr. Arnoldo Morale      06/25/2016 - 08/15/2016 Chemotherapy    The patient had palonosetron (ALOXI) injection 0.25 mg, 0.25 mg, Intravenous,  Once, 6 of 6 cycles  CARBOplatin (PARAPLATIN) 220 mg in sodium chloride 0.9 % 250 mL chemo infusion, 220 mg (100 % of original dose 215.8 mg), Intravenous,  Once, 6 of 6 cycles Dose modification:   (original dose 215.8 mg, Cycle 1),   (original dose 215.8 mg, Cycle 5),   (original dose 215.8 mg, Cycle 6),   (original dose 215.8 mg, Cycle 2),   (original dose 215.8 mg, Cycle 3),   (original dose 215.8 mg, Cycle 4)  PACLitaxel (TAXOL) 90 mg in dextrose 5 % 250 mL chemo infusion ( for chemotherapy treatment.        06/25/2016 - 08/20/2016 Radiation Therapy    Radiation in [place], [place] by Dr. Quitman Livings.  R lung to 6600 cGy  07/09/2016 - 07/10/2016 Hospital Admission    Admit date: 07/09/2016  Admission diagnosis: Fever and chills Additional comments: Negative work-up, discharged with course of Levaquin      07/09/2016 Treatment Plan Change    Treatment deferred x 1 week due to hospitalization       08/05/2016 - 08/06/2016 Hospital Admission    Admit date: 08/05/2016 Admission diagnosis: Joint pain Additional comments: Treated with steroids with symptomatic  improvement      08/25/2016 Imaging    MRI brain- Negative for intracranial metastasis on this motion degraded examination.  Stable mild-to-moderate chronic small vessel ischemic disease.      09/18/2016 PET scan    1. Partial metabolic response. Right upper lobe 2.6 cm hypermetabolic pulmonary nodule, decreased in size and metabolism. Mildly hypermetabolic right hilar nodal metastasis, decreased in metabolism. Right paratracheal lymph node is no longer hypermetabolic. No new or progressive metastatic disease. 2. Apical left upper lobe 0.9 cm solid pulmonary nodule, slightly decreased in size, with no significant metabolism. The change in size could indicate a neoplastic etiology for this nodule. 3. Additional subcentimeter pulmonary nodules in the left lung are stable and below PET resolution . 4. Additional findings include aortic atherosclerosis, coronary atherosclerosis, moderate emphysema, stable small pericardial effusion/thickening and mild sigmoid diverticulosis.       HISTORY OF PRESENT ILLNESS:   Varian Innes is a 72 y.o. male returns for follow up of lung cancer currently on treatment with imfinzi. He is accompanied by his wife today. He states that he's been doing relatively well except for continued fatigue. He states that he has been tolerating imfinzi well except for nausea in the mornings. He has been taking Zofran every 8 hours with some relief of his nausea. He also has mild diarrhea with watery stools about 2-3 times a day. He denies any shortness breath, chest pain, abdominal pain. He states that he is not eating as much as he should, eats 3 meals a day however his biggest meal is normally during dinner.   Review of Systems  Constitutional: Positive for malaise/fatigue. Negative for chills and fever.  HENT: Negative.  Negative for hearing loss, sore throat and tinnitus.   Eyes: Negative for blurred vision, photophobia, discharge and redness.  Respiratory:  Negative.  Negative for cough, hemoptysis, shortness of breath and wheezing.   Cardiovascular: Negative.  Negative for chest pain, palpitations, orthopnea, claudication and leg swelling.  Gastrointestinal: Negative.  Negative for abdominal pain, constipation, diarrhea, melena, nausea and vomiting.  Genitourinary: Negative.  Negative for dysuria and hematuria.  Musculoskeletal: Negative.  Negative for back pain, joint pain and myalgias.  Skin: Negative.  Negative for itching and rash.  Neurological: Negative.  Negative for dizziness, weakness and headaches.  Endo/Heme/Allergies: Negative.  Negative for environmental allergies and polydipsia. Does not bruise/bleed easily.  Psychiatric/Behavioral: Negative for depression and substance abuse. The patient is not nervous/anxious and does not have insomnia.   All other systems reviewed and are negative. 14 point review of systems was performed and is negative except as detailed under history of present illness and above  PAST MEDICAL HISTORY:   Past Medical History:  Diagnosis Date  . Adenocarcinoma of lung, right ([place]) 04/18/2016  . Anxiety   . Arthritis   . CHF (congestive heart failure) ([place])   . COPD (chronic obstructive pulmonary disease) ([place])   . Depression   . Diabetes mellitus without complication ([facility])    no meds  . DM (diabetes mellitus) ([place]) 07/09/2016  . Dyspnea   .  History of kidney stones   . Hyperlipidemia   . Hypertension   . Macular degeneration   . Neuropathy   . Non-small cell lung cancer, right ([place]) 04/18/2016  . Prostatitis   . Pulmonary nodule, left 07/16/2016  . Sleep apnea    cpap    ALLERGIES: No Known Allergies    MEDICATIONS: I have reviewed the patient's current medications.    Current Outpatient Prescriptions on File Prior to Visit  Medication Sig Dispense Refill  . albuterol (PROVENTIL HFA;VENTOLIN HFA) 108 (90 Base) MCG/ACT inhaler Inhale 2 puffs into the lungs every 6 (six) hours as needed for  wheezing or shortness of breath. 1 Inhaler 2  . aspirin EC 81 MG tablet Take 81 mg by mouth daily.    . finasteride (PROSCAR) 5 MG tablet TAKE 1 TABLET EVERY DAY 30 tablet 11  . gabapentin (NEURONTIN) 300 MG capsule TAKE 1 CAPSULE BY MOUTH THREE TIMES A DAY 90 capsule 0  . INCRUSE ELLIPTA 62.5 MCG/INH AEPB INHALE 1 PUFF INTO THE LUNGS DAILY. 30 each 2  . ondansetron (ZOFRAN) 8 MG tablet Take 1 tablet (8 mg total) by mouth every 8 (eight) hours as needed for nausea or vomiting. 60 tablet 1  . pravastatin (PRAVACHOL) 40 MG tablet Take 40 mg by mouth every 3 (three) days. M-W-F    . tamsulosin (FLOMAX) 0.4 MG CAPS capsule Take 0.4 mg by mouth daily after breakfast.    . TRADJENTA 5 MG TABS tablet TAKE 1 TABLET (5 MG TOTAL) BY MOUTH DAILY. 30 tablet 1  . acetaminophen (TYLENOL) 325 MG tablet Take 2 tablets (650 mg total) by mouth every 6 (six) hours as needed for mild pain (or Fever >/= 101). (Patient not taking: Reported on 09/22/2016)    . cloNIDine (CATAPRES) 0.1 MG tablet TAKE 1 TABLET (0.1 MG TOTAL) BY MOUTH 3 (THREE) TIMES DAILY. (Patient not taking: Reported on 10/24/2016) 90 tablet 1  . Durvalumab (IMFINZI IV) Inject into the vein. Every 2 weeks    . fentaNYL (DURAGESIC - DOSED MCG/HR) 12 MCG/HR Place 1 patch (12.5 mcg total) onto the skin every 3 (three) days. (Patient not taking: Reported on 09/22/2016) 10 patch 0  . fluticasone (FLONASE) 50 MCG/ACT nasal spray Place 2 sprays into both nostrils daily. (Patient not taking: Reported on 10/24/2016) 16 g 6  . Fluticasone-Salmeterol (ADVAIR DISKUS IN) Inhale 1 puff into the lungs 2 (two) times daily.    Marland Kitchen HYDROcodone-acetaminophen (NORCO/VICODIN) 5-325 MG tablet Take 2 tablets by mouth every 4 (four) hours as needed. (Patient not taking: Reported on 09/22/2016) 6 tablet 0  . ibuprofen (ADVIL,MOTRIN) 600 MG tablet Take 1 tablet (600 mg total) by mouth every 6 (six) hours as needed. (Patient not taking: Reported on 09/22/2016) 30 tablet 0  . LORazepam  (ATIVAN) 0.5 MG tablet TAKE 1 TABLET BY MOUTH TWICE A DAY AS NEEDED (Patient not taking: Reported on 10/24/2016) 30 tablet 1  . magic mouthwash w/lidocaine SOLN USE 1 TEASPOONFUL BY 15MINS BEFORE MEALS AND AS NEEDED FOR DISCOMFORT,SWISH AND SWALLOW  1  . omeprazole (PRILOSEC) 20 MG capsule Take 20 mg by mouth daily.     . traZODone (DESYREL) 50 MG tablet TAKE 1 TABLET (50 MG TOTAL) BY MOUTH AT BEDTIME AS NEEDED FOR SLEEP. (Patient not taking: Reported on 10/24/2016) 30 tablet 0  . [DISCONTINUED] prochlorperazine (COMPAZINE) 10 MG tablet Take 1 tablet (10 mg total) by mouth every 6 (six) hours as needed (Nausea or vomiting). 30 tablet 1   No  current facility-administered medications on file prior to visit.      PAST SURGICAL HISTORY Past Surgical History:  Procedure Laterality Date  . CATARACT EXTRACTION, BILATERAL Bilateral   . NO PAST SURGERIES    . PORTACATH PLACEMENT Left 06/13/2016   Procedure: INSERTION PORT-A-CATH;  Surgeon: Aviva Signs, MD;  Location: [facility];  Service: General;  Laterality: Left;  Marland Kitchen VIDEO BRONCHOSCOPY WITH ENDOBRONCHIAL NAVIGATION N/A 05/28/2016   Procedure: VIDEO BRONCHOSCOPY WITH ENDOBRONCHIAL NAVIGATION;  Surgeon: [name], MD;  Location: [facility];  Service: Thoracic;  Laterality: N/A;  . VIDEO BRONCHOSCOPY WITH ENDOBRONCHIAL ULTRASOUND N/A 05/28/2016   Procedure: VIDEO BRONCHOSCOPY WITH ENDOBRONCHIAL ULTRASOUND;  Surgeon: [name], MD;  Location: [facility];  Service: Thoracic;  Laterality: N/A;    FAMILY HISTORY: Family History  Problem Relation Age of Onset  . Hypertension Mother   . Diabetes Father   . Heart disease Father   . Hypertension Sister    Mother deceased at age 63 from renal failure Father deceased at age 58 from stroke. He has 5 children:  67 yo son  66 yo daughter  57 yo daughter  37 yo daughter  42 yo son He denies any cancer in his family  SOCIAL HISTORY:  reports that he has been smoking Cigarettes.  He started smoking  about 55 years ago. He has a 27.50 pack-year smoking history. He has never used smokeless tobacco. He reports that he does not drink alcohol or use drugs.  He recently cut his smoking down to 0.5 ppd.  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  Social History   Social History  . Marital status: Divorced    Spouse name: N/A  . Number of children: N/A  . Years of education: N/A   Social History Main Topics  . Smoking status: Current Every Day Smoker    Packs/day: 0.50    Years: 55.00    Types: Cigarettes    Start date: 03/11/1961  . Smokeless tobacco: Never Used  . Alcohol use No  . Drug use: No  . Sexual activity: Yes    Birth control/ protection: None   Other Topics Concern  . None   Social History Narrative  . None    PERFORMANCE STATUS: The patient's performance status is 1 - Symptomatic but completely ambulatory  PHYSICAL EXAM: Most Recent Vital Signs: Blood pressure (!) 163/68, pulse 65, temperature 97.9 F (36.6 C), temperature source Oral, resp. rate 16, weight 173 lb 14.4 oz (78.9 kg), SpO2 99 %. BP (!) 163/68 (BP Location: Left Arm, Patient Position: Sitting)   Pulse 65   Temp 97.9 F (36.6 C) (Oral)   Resp 16   Wt 173 lb 14.4 oz (78.9 kg)   SpO2 99%   BMI 28.94 kg/m   Physical Exam  Constitutional: He is oriented to person, place, and time and well-developed, well-nourished, and in no distress. No distress.  HENT:  Head: Normocephalic and atraumatic.  Mouth/Throat: Oropharynx is clear and moist. No oropharyngeal exudate.  Eyes: Pupils are equal, round, and reactive to light. Conjunctivae and EOM are normal. No scleral icterus.  Neck: Normal range of motion. Neck supple. No JVD present.  Cardiovascular: Normal rate, regular rhythm and normal heart sounds.  Exam reveals no gallop and no friction rub.   No murmur heard. Pulmonary/Chest: Effort normal and breath sounds normal. No respiratory distress. He has no wheezes. He  has no rales. He exhibits no tenderness.  Abdominal: Soft. Bowel sounds are normal. He exhibits no distension and no mass. There is no tenderness. There is no rebound and no guarding.  Musculoskeletal: Normal range of motion. He exhibits no edema or tenderness.  Lymphadenopathy:    He has no cervical adenopathy.  Neurological: He is alert and oriented to person, place, and time. No cranial nerve deficit. Gait normal.  Skin: Skin is warm and dry. No rash noted. No erythema. No pallor.  Psychiatric: Affect and judgment normal.  Nursing note and vitals reviewed.  LABORATORY DATA:  Results for orders placed or performed in visit on 10/24/16 (from the past 48 hour(s))  CBC with Differential     Status: Abnormal   Collection Time: 10/24/16 10:47 AM  Result Value Ref Range   WBC 6.8 4.0 - 10.5 K/uL   RBC 3.51 (L) 4.22 - 5.81 MIL/uL   Hemoglobin 12.4 (L) 13.0 - 17.0 g/dL   HCT 36.6 (L) 39.0 - 52.0 %   MCV 104.3 (H) 78.0 - 100.0 fL   MCH 35.3 (H) 26.0 - 34.0 pg   MCHC 33.9 30.0 - 36.0 g/dL   RDW 14.2 11.5 - 15.5 %   Platelets 295 150 - 400 K/uL   Neutrophils Relative % 80 %   Neutro Abs 5.4 1.7 - 7.7 K/uL   Lymphocytes Relative 7 %   Lymphs Abs 0.5 (L) 0.7 - 4.0 K/uL   Monocytes Relative 11 %   Monocytes Absolute 0.7 0.1 - 1.0 K/uL   Eosinophils Relative 2 %   Eosinophils Absolute 0.2 0.0 - 0.7 K/uL   Basophils Relative 0 %   Basophils Absolute 0.0 0.0 - 0.1 K/uL  Comprehensive metabolic panel     Status: Abnormal   Collection Time: 10/24/16 10:47 AM  Result Value Ref Range   Sodium 139 135 - 145 mmol/L   Potassium 3.4 (L) 3.5 - 5.1 mmol/L   Chloride 101 101 - 111 mmol/L   CO2 29 22 - 32 mmol/L   Glucose, Bld 136 (H) 65 - 99 mg/dL   BUN 14 6 - 20 mg/dL   Creatinine, Ser 0.73 0.61 - 1.24 mg/dL   Calcium 9.1 8.9 - 10.3 mg/dL   Total Protein 7.0 6.5 - 8.1 g/dL   Albumin 3.6 3.5 - 5.0 g/dL   AST 17 15 - 41 U/L   ALT 12 (L) 17 - 63 U/L   Alkaline Phosphatase 78 38 - 126 U/L    Total Bilirubin 0.4 0.3 - 1.2 mg/dL   GFR calc non Af Amer >60 >60 mL/min   GFR calc Af Amer >60 >60 mL/min    Comment: (NOTE) The eGFR has been calculated using the CKD EPI equation. This calculation has not been validated in all clinical situations. eGFR's persistently <60 mL/min signify possible Chronic Kidney Disease.    Anion gap 9 5 - 15     CBC    Component Value Date/Time   WBC 6.8 10/24/2016 1047   RBC 3.51 (L) 10/24/2016 1047   HGB 12.4 (L) 10/24/2016 1047   HGB 9.6 (L) 08/28/2016 1117   HCT 36.6 (L) 10/24/2016 1047   HCT 27.7 (L) 08/28/2016 1117   PLT 295 10/24/2016 1047   PLT 268 08/28/2016 1117   MCV 104.3 (H) 10/24/2016 1047   MCV 96 08/28/2016 1117   MCH 35.3 (H) 10/24/2016 1047   MCHC 33.9 10/24/2016 1047   RDW 14.2 10/24/2016 1047   RDW 19.4 (H) 08/28/2016 1117   LYMPHSABS 0.5 (L) 10/24/2016 1047  LYMPHSABS 0.6 (L) 08/28/2016 1117   MONOABS 0.7 10/24/2016 1047   EOSABS 0.2 10/24/2016 1047   EOSABS 0.0 08/28/2016 1117   BASOSABS 0.0 10/24/2016 1047   BASOSABS 0.0 08/28/2016 1117   CMP Latest Ref Rng & Units 10/24/2016 10/10/2016 09/26/2016  Glucose 65 - 99 mg/dL 136(H) 142(H) 189(H)  BUN 6 - 20 mg/dL 14 17 17   Creatinine 0.61 - 1.24 mg/dL 0.73 0.65 0.76  Sodium 135 - 145 mmol/L 139 138 138  Potassium 3.5 - 5.1 mmol/L 3.4(L) 3.6 3.2(L)  Chloride 101 - 111 mmol/L 101 100(L) 100(L)  CO2 22 - 32 mmol/L 29 30 30   Calcium 8.9 - 10.3 mg/dL 9.1 9.2 9.2  Total Protein 6.5 - 8.1 g/dL 7.0 6.9 6.7  Total Bilirubin 0.3 - 1.2 mg/dL 0.4 0.5 0.5  Alkaline Phos 38 - 126 U/L 78 76 79  AST 15 - 41 U/L 17 18 18   ALT 17 - 63 U/L 12(L) 14(L) 15(L)   RADIOGRAPHY: I have personally reviewed the radiological images as listed and agreed with the findings in the report.  DG Chest 2 View 07/08/2016 IMPRESSION: No consolidation or effusion.  MRI Brain 05/06/2016 IMPRESSION: Negative for metastatic disease.   PATHOLOGY:     ASSESSMENT/PLAN:  Non-small cell lung cancer,  right ([facility]) NSCLC of right lung with hilar and paratracheal lymphadenopathy on PET imaging, favoring adenocarcinoma.  Complicated staging situation but in giving the benefit of the doubt, he is staged as a Stage IIB (T1bN1M0) with two adjacent RUL pulmonary nodules, RIGHT-sided lymphadenopathy as described AND a LUL pulmonary nodule measuring 11 mm with low metabolic activity biopsied but without any malignancy identified.  Completed concomitant chemoradiation with weekly Carboplatin/Paclitaxel from 06/25/2016 - 08/15/16. Now on consolidation treatment with imfinzi.  PLAN: Proceed with imfinzi, cycle 3 today. Plan to repeat his scans in September 2018. Encouraged patient to eat more frequently as well as to increase protein in his diet. Advised patient to take his Zofran every 6 hours to help with his nausea.  Follow-up in 4 weeks.  THERAPY PLAN:  Imfinzi q14 days for up to 52 weeks  This note is electronically signed by:  Twana First, MD  10/24/2016 12:41 PM

## 2016-10-24 NOTE — Progress Notes (Signed)
imfinzi given today per orders. Patient tolerated it well without problems. Vitals stable and discharged home from clinic ambulatory. Follow up as scheduled.

## 2016-11-04 ENCOUNTER — Ambulatory Visit (INDEPENDENT_AMBULATORY_CARE_PROVIDER_SITE_OTHER): Payer: Medicare Other | Admitting: Cardiovascular Disease

## 2016-11-04 ENCOUNTER — Encounter: Payer: Self-pay | Admitting: Cardiovascular Disease

## 2016-11-04 VITALS — BP 119/69 | HR 107 | Ht 66.0 in | Wt 174.0 lb

## 2016-11-04 DIAGNOSIS — I2510 Atherosclerotic heart disease of native coronary artery without angina pectoris: Secondary | ICD-10-CM

## 2016-11-04 DIAGNOSIS — R9439 Abnormal result of other cardiovascular function study: Secondary | ICD-10-CM

## 2016-11-04 DIAGNOSIS — R079 Chest pain, unspecified: Secondary | ICD-10-CM | POA: Diagnosis not present

## 2016-11-04 DIAGNOSIS — I313 Pericardial effusion (noninflammatory): Secondary | ICD-10-CM | POA: Diagnosis not present

## 2016-11-04 DIAGNOSIS — I10 Essential (primary) hypertension: Secondary | ICD-10-CM

## 2016-11-04 DIAGNOSIS — E7800 Pure hypercholesterolemia, unspecified: Secondary | ICD-10-CM | POA: Diagnosis not present

## 2016-11-04 DIAGNOSIS — I3139 Other pericardial effusion (noninflammatory): Secondary | ICD-10-CM

## 2016-11-04 DIAGNOSIS — R0609 Other forms of dyspnea: Secondary | ICD-10-CM

## 2016-11-04 NOTE — Progress Notes (Signed)
SUBJECTIVE: The patient presents for follow-up of chest pain, exertional dyspnea, malignant hypertension, and pericardial effusion.  Echocardiogram on 09/17/16 demonstrated normal left ventricular systolic function with a small to moderate size pericardial effusion with significant improvement posteriorly compared to the prior study in February. There was undue relation of the right atrial free wall in diastole but no collapse with no significant variation in mitral inflow to suggest tamponade physiology.  Nuclear stress test on 05/05/16 was low risk, with a very small, mild intensity, partially reversible inferior defect suggesting a small ischemic territory.  He has non-small cell carcinoma of the right lung.  He has felt fatigued since starting a new chemotherapeutic medication. He has also felt somewhat short of breath. He continues to smoke a half pack of cigarettes daily. He denies chest pain.    Review of Systems: As per "subjective", otherwise negative.  No Known Allergies  Current Outpatient Prescriptions  Medication Sig Dispense Refill  . acetaminophen (TYLENOL) 325 MG tablet Take 2 tablets (650 mg total) by mouth every 6 (six) hours as needed for mild pain (or Fever >/= 101).    Marland Kitchen albuterol (PROVENTIL HFA;VENTOLIN HFA) 108 (90 Base) MCG/ACT inhaler Inhale 2 puffs into the lungs every 6 (six) hours as needed for wheezing or shortness of breath. 1 Inhaler 2  . aspirin EC 81 MG tablet Take 81 mg by mouth daily.    . cloNIDine (CATAPRES) 0.1 MG tablet TAKE 1 TABLET (0.1 MG TOTAL) BY MOUTH 3 (THREE) TIMES DAILY. 90 tablet 1  . Durvalumab (IMFINZI IV) Inject into the vein. Every 2 weeks    . fentaNYL (DURAGESIC - DOSED MCG/HR) 12 MCG/HR Place 1 patch (12.5 mcg total) onto the skin every 3 (three) days. 10 patch 0  . finasteride (PROSCAR) 5 MG tablet TAKE 1 TABLET EVERY DAY 30 tablet 11  . fluticasone (FLONASE) 50 MCG/ACT nasal spray Place 2 sprays into both nostrils daily. 16 g  6  . Fluticasone-Salmeterol (ADVAIR DISKUS IN) Inhale 1 puff into the lungs 2 (two) times daily.    Marland Kitchen gabapentin (NEURONTIN) 300 MG capsule TAKE 1 CAPSULE BY MOUTH THREE TIMES A DAY 90 capsule 0  . HYDROcodone-acetaminophen (NORCO/VICODIN) 5-325 MG tablet Take 2 tablets by mouth every 4 (four) hours as needed. 6 tablet 0  . ibuprofen (ADVIL,MOTRIN) 600 MG tablet Take 1 tablet (600 mg total) by mouth every 6 (six) hours as needed. 30 tablet 0  . INCRUSE ELLIPTA 62.5 MCG/INH AEPB INHALE 1 PUFF INTO THE LUNGS DAILY. 30 each 2  . irbesartan-hydrochlorothiazide (AVALIDE) 300-12.5 MG tablet TAKE 1 TABLET BY MOUTH DAILY. 90 tablet 1  . LORazepam (ATIVAN) 0.5 MG tablet TAKE 1 TABLET BY MOUTH TWICE A DAY AS NEEDED 30 tablet 1  . magic mouthwash w/lidocaine SOLN USE 1 TEASPOONFUL BY 15MINS BEFORE MEALS AND AS NEEDED FOR DISCOMFORT,SWISH AND SWALLOW  1  . omeprazole (PRILOSEC) 20 MG capsule Take 20 mg by mouth daily.     . ondansetron (ZOFRAN) 8 MG tablet Take 1 tablet (8 mg total) by mouth every 8 (eight) hours as needed for nausea or vomiting. 60 tablet 1  . pravastatin (PRAVACHOL) 40 MG tablet Take 40 mg by mouth every 3 (three) days. M-W-F    . tamsulosin (FLOMAX) 0.4 MG CAPS capsule Take 0.4 mg by mouth daily after breakfast.    . TRADJENTA 5 MG TABS tablet TAKE 1 TABLET (5 MG TOTAL) BY MOUTH DAILY. 30 tablet 1  . traZODone (DESYREL) 50  MG tablet Take 1 tablet (50 mg total) by mouth at bedtime as needed for sleep. 30 tablet 0   No current facility-administered medications for this visit.     Past Medical History:  Diagnosis Date  . Adenocarcinoma of lung, right ([place]) 04/18/2016  . Anxiety   . Arthritis   . CHF (congestive heart failure) ([place])   . COPD (chronic obstructive pulmonary disease) ([place])   . Depression   . Diabetes mellitus without complication ([facility])    no meds  . DM (diabetes mellitus) ([place]) 07/09/2016  . Dyspnea   . History of kidney stones   . Hyperlipidemia   . Hypertension   .  Macular degeneration   . Neuropathy   . Non-small cell lung cancer, right ([place]) 04/18/2016  . Prostatitis   . Pulmonary nodule, left 07/16/2016  . Sleep apnea    cpap    Past Surgical History:  Procedure Laterality Date  . CATARACT EXTRACTION, BILATERAL Bilateral   . NO PAST SURGERIES    . PORTACATH PLACEMENT Left 06/13/2016   Procedure: INSERTION PORT-A-CATH;  Surgeon: Aviva Signs, MD;  Location: [facility];  Service: General;  Laterality: Left;  Marland Kitchen VIDEO BRONCHOSCOPY WITH ENDOBRONCHIAL NAVIGATION N/A 05/28/2016   Procedure: VIDEO BRONCHOSCOPY WITH ENDOBRONCHIAL NAVIGATION;  Surgeon: [name], MD;  Location: [place];  Service: Thoracic;  Laterality: N/A;  . VIDEO BRONCHOSCOPY WITH ENDOBRONCHIAL ULTRASOUND N/A 05/28/2016   Procedure: VIDEO BRONCHOSCOPY WITH ENDOBRONCHIAL ULTRASOUND;  Surgeon: [name], MD;  Location: [place];  Service: Thoracic;  Laterality: N/A;    Social History   Social History  . Marital status: Divorced    Spouse name: N/A  . Number of children: N/A  . Years of education: N/A   Occupational History  . Not on file.   Social History Main Topics  . Smoking status: Current Every Day Smoker    Packs/day: 0.50    Years: 55.00    Types: Cigarettes    Start date: 03/11/1961  . Smokeless tobacco: Never Used  . Alcohol use No  . Drug use: No  . Sexual activity: Yes    Birth control/ protection: None   Other Topics Concern  . Not on file   Social History Narrative  . No narrative on file     Vitals:   11/04/16 1601  BP: 119/69  Pulse: (!) 107  SpO2: 93%  Weight: 174 lb (78.9 kg)  Height: 5\' 6"  (1.676 m)    Wt Readings from Last 3 Encounters:  11/04/16 174 lb (78.9 kg)  10/24/16 173 lb 14.4 oz (78.9 kg)  10/10/16 173 lb (78.5 kg)     PHYSICAL EXAM General: NAD HEENT: Normal. Neck: No JVD, no thyromegaly. Lungs: Clear to auscultation bilaterally with normal respiratory effort. CV: Nondisplaced PMI.  Tachy, regular rhythm,  normal S1/S2, no S3/S4, no murmur. No pretibial or periankle edema.   Abdomen: Soft, nontender, no distention.  Neurologic: Alert and oriented.  Psych: Normal affect. Skin: Normal. Musculoskeletal: No gross deformities.    ECG: Most recent ECG reviewed.   Labs: Lab Results  Component Value Date/Time   K 3.4 (L) 10/24/2016 10:47 AM   BUN 14 10/24/2016 10:47 AM   BUN 15 08/28/2016 11:17 AM   CREATININE 0.73 10/24/2016 10:47 AM   ALT 12 (L) 10/24/2016 10:47 AM   TSH 1.139 08/05/2016 04:14 AM   HGB 12.4 (L) 10/24/2016 10:47 AM   HGB 9.6 (L) 08/28/2016 11:17 AM     Lipids: Lab Results  Component Value Date/Time   LDLCALC 107 (H) 02/01/2016 10:07 AM   CHOL 169 02/01/2016 10:07 AM   TRIG 159 (H) 02/01/2016 10:07 AM   HDL 30 (L) 02/01/2016 10:07 AM       ASSESSMENT AND PLAN:  1. Chest pain and exertional dyspnea: Lexiscan Myoview stress test was low risk with small area of inferior ischemia. Will manage medically. Continue ASA, beta blocker, and statin. Cardiovascular risk factors include hypertension, hyperlipidemia, and tobacco abuse. I will also monitor his pericardial effusion.  2. Malignant hypertension: Controlled. No changes.  3. Hyperlipidemia: On pravastatin.  4. Tobacco abuse disorder  5. Pericardial effusion: Will repeat a limited echo in 2 months, small to moderate in size. Likely malignant.  6. Non small cell lung CA of right lung: Being treated with chemotherapy and radiation.    Disposition: Follow up 6 months.   [name], M.D., F.A.C.C.

## 2016-11-04 NOTE — Patient Instructions (Signed)

## 2016-11-06 ENCOUNTER — Encounter (HOSPITAL_BASED_OUTPATIENT_CLINIC_OR_DEPARTMENT_OTHER): Payer: Medicare Other | Attending: Internal Medicine

## 2016-11-06 DIAGNOSIS — E11621 Type 2 diabetes mellitus with foot ulcer: Secondary | ICD-10-CM | POA: Insufficient documentation

## 2016-11-06 DIAGNOSIS — F17210 Nicotine dependence, cigarettes, uncomplicated: Secondary | ICD-10-CM | POA: Diagnosis not present

## 2016-11-06 DIAGNOSIS — Z9221 Personal history of antineoplastic chemotherapy: Secondary | ICD-10-CM | POA: Insufficient documentation

## 2016-11-06 DIAGNOSIS — L97513 Non-pressure chronic ulcer of other part of right foot with necrosis of muscle: Secondary | ICD-10-CM | POA: Insufficient documentation

## 2016-11-06 DIAGNOSIS — E1140 Type 2 diabetes mellitus with diabetic neuropathy, unspecified: Secondary | ICD-10-CM | POA: Diagnosis not present

## 2016-11-06 DIAGNOSIS — E1142 Type 2 diabetes mellitus with diabetic polyneuropathy: Secondary | ICD-10-CM | POA: Insufficient documentation

## 2016-11-06 DIAGNOSIS — G4730 Sleep apnea, unspecified: Secondary | ICD-10-CM | POA: Insufficient documentation

## 2016-11-06 DIAGNOSIS — C3411 Malignant neoplasm of upper lobe, right bronchus or lung: Secondary | ICD-10-CM | POA: Diagnosis not present

## 2016-11-06 DIAGNOSIS — L97512 Non-pressure chronic ulcer of other part of right foot with fat layer exposed: Secondary | ICD-10-CM | POA: Diagnosis not present

## 2016-11-06 DIAGNOSIS — I110 Hypertensive heart disease with heart failure: Secondary | ICD-10-CM | POA: Insufficient documentation

## 2016-11-06 DIAGNOSIS — I509 Heart failure, unspecified: Secondary | ICD-10-CM | POA: Insufficient documentation

## 2016-11-06 DIAGNOSIS — Z923 Personal history of irradiation: Secondary | ICD-10-CM | POA: Diagnosis not present

## 2016-11-06 DIAGNOSIS — J449 Chronic obstructive pulmonary disease, unspecified: Secondary | ICD-10-CM | POA: Diagnosis not present

## 2016-11-07 ENCOUNTER — Encounter (HOSPITAL_COMMUNITY): Payer: Medicare Other | Attending: Oncology

## 2016-11-07 ENCOUNTER — Encounter (HOSPITAL_COMMUNITY): Payer: Self-pay

## 2016-11-07 VITALS — BP 123/58 | HR 104 | Temp 98.0°F | Resp 18 | Wt 175.6 lb

## 2016-11-07 DIAGNOSIS — Z923 Personal history of irradiation: Secondary | ICD-10-CM | POA: Diagnosis not present

## 2016-11-07 DIAGNOSIS — H3530 Unspecified macular degeneration: Secondary | ICD-10-CM | POA: Insufficient documentation

## 2016-11-07 DIAGNOSIS — I509 Heart failure, unspecified: Secondary | ICD-10-CM | POA: Insufficient documentation

## 2016-11-07 DIAGNOSIS — Z79899 Other long term (current) drug therapy: Secondary | ICD-10-CM | POA: Insufficient documentation

## 2016-11-07 DIAGNOSIS — G4730 Sleep apnea, unspecified: Secondary | ICD-10-CM | POA: Diagnosis not present

## 2016-11-07 DIAGNOSIS — F17210 Nicotine dependence, cigarettes, uncomplicated: Secondary | ICD-10-CM | POA: Diagnosis not present

## 2016-11-07 DIAGNOSIS — R110 Nausea: Secondary | ICD-10-CM | POA: Insufficient documentation

## 2016-11-07 DIAGNOSIS — Z9221 Personal history of antineoplastic chemotherapy: Secondary | ICD-10-CM | POA: Diagnosis not present

## 2016-11-07 DIAGNOSIS — J449 Chronic obstructive pulmonary disease, unspecified: Secondary | ICD-10-CM | POA: Insufficient documentation

## 2016-11-07 DIAGNOSIS — C3491 Malignant neoplasm of unspecified part of right bronchus or lung: Secondary | ICD-10-CM | POA: Diagnosis not present

## 2016-11-07 DIAGNOSIS — Z8249 Family history of ischemic heart disease and other diseases of the circulatory system: Secondary | ICD-10-CM | POA: Insufficient documentation

## 2016-11-07 DIAGNOSIS — F329 Major depressive disorder, single episode, unspecified: Secondary | ICD-10-CM | POA: Insufficient documentation

## 2016-11-07 DIAGNOSIS — Z87442 Personal history of urinary calculi: Secondary | ICD-10-CM | POA: Diagnosis not present

## 2016-11-07 DIAGNOSIS — Z5112 Encounter for antineoplastic immunotherapy: Secondary | ICD-10-CM

## 2016-11-07 DIAGNOSIS — Z7982 Long term (current) use of aspirin: Secondary | ICD-10-CM | POA: Insufficient documentation

## 2016-11-07 DIAGNOSIS — I110 Hypertensive heart disease with heart failure: Secondary | ICD-10-CM | POA: Insufficient documentation

## 2016-11-07 DIAGNOSIS — E785 Hyperlipidemia, unspecified: Secondary | ICD-10-CM | POA: Insufficient documentation

## 2016-11-07 DIAGNOSIS — E1142 Type 2 diabetes mellitus with diabetic polyneuropathy: Secondary | ICD-10-CM | POA: Insufficient documentation

## 2016-11-07 DIAGNOSIS — Z833 Family history of diabetes mellitus: Secondary | ICD-10-CM | POA: Insufficient documentation

## 2016-11-07 DIAGNOSIS — F419 Anxiety disorder, unspecified: Secondary | ICD-10-CM | POA: Insufficient documentation

## 2016-11-07 LAB — CBC WITH DIFFERENTIAL/PLATELET
BASOS ABS: 0 10*3/uL (ref 0.0–0.1)
BASOS PCT: 0 %
Eosinophils Absolute: 0.2 10*3/uL (ref 0.0–0.7)
Eosinophils Relative: 2 %
HEMATOCRIT: 36.4 % — AB (ref 39.0–52.0)
HEMOGLOBIN: 12.4 g/dL — AB (ref 13.0–17.0)
LYMPHS PCT: 7 %
Lymphs Abs: 0.5 10*3/uL — ABNORMAL LOW (ref 0.7–4.0)
MCH: 35.1 pg — ABNORMAL HIGH (ref 26.0–34.0)
MCHC: 34.1 g/dL (ref 30.0–36.0)
MCV: 103.1 fL — AB (ref 78.0–100.0)
Monocytes Absolute: 0.9 10*3/uL (ref 0.1–1.0)
Monocytes Relative: 12 %
NEUTROS ABS: 6.1 10*3/uL (ref 1.7–7.7)
NEUTROS PCT: 79 %
Platelets: 295 10*3/uL (ref 150–400)
RBC: 3.53 MIL/uL — AB (ref 4.22–5.81)
RDW: 13.4 % (ref 11.5–15.5)
WBC: 7.8 10*3/uL (ref 4.0–10.5)

## 2016-11-07 LAB — COMPREHENSIVE METABOLIC PANEL
ALBUMIN: 3.5 g/dL (ref 3.5–5.0)
ALK PHOS: 70 U/L (ref 38–126)
ALT: 11 U/L — AB (ref 17–63)
ANION GAP: 8 (ref 5–15)
AST: 17 U/L (ref 15–41)
BILIRUBIN TOTAL: 0.3 mg/dL (ref 0.3–1.2)
BUN: 15 mg/dL (ref 6–20)
CALCIUM: 8.9 mg/dL (ref 8.9–10.3)
CO2: 29 mmol/L (ref 22–32)
CREATININE: 0.68 mg/dL (ref 0.61–1.24)
Chloride: 102 mmol/L (ref 101–111)
GFR calc Af Amer: >60 mL/min (ref >60)
GFR calc non Af Amer: >60 mL/min (ref >60)
GLUCOSE: 145 mg/dL — AB (ref 65–99)
Potassium: 3.4 mmol/L — ABNORMAL LOW (ref 3.5–5.1)
Sodium: 139 mmol/L (ref 135–145)
TOTAL PROTEIN: 6.9 g/dL (ref 6.5–8.1)

## 2016-11-07 LAB — TSH: TSH: 1.644 u[IU]/mL (ref 0.350–4.500)

## 2016-11-07 MED ORDER — POTASSIUM CHLORIDE CRYS ER 20 MEQ PO TBCR
EXTENDED_RELEASE_TABLET | ORAL | Status: AC
  Filled 2016-11-07: qty 2

## 2016-11-07 MED ORDER — HEPARIN SOD (PORK) LOCK FLUSH 100 UNIT/ML IV SOLN
500.0000 [IU] | Freq: Once | INTRAVENOUS | Status: AC | PRN
  Administered 2016-11-07: 500 [IU]

## 2016-11-07 MED ORDER — POTASSIUM CHLORIDE CRYS ER 20 MEQ PO TBCR
40.0000 meq | EXTENDED_RELEASE_TABLET | Freq: Once | ORAL | Status: AC
  Administered 2016-11-07: 40 meq via ORAL

## 2016-11-07 MED ORDER — SODIUM CHLORIDE 0.9% FLUSH: 10.0000 mL | INTRAVENOUS | Status: DC | PRN

## 2016-11-07 MED ORDER — SODIUM CHLORIDE 0.9 % IV SOLN
9.3000 mg/kg | Freq: Once | INTRAVENOUS | Status: AC
  Administered 2016-11-07: 740 mg via INTRAVENOUS
  Filled 2016-11-07: qty 4.8

## 2016-11-07 MED ORDER — SODIUM CHLORIDE 0.9 % IV SOLN
Freq: Once | INTRAVENOUS | Status: AC
  Administered 2016-11-07: 12:00:00 via INTRAVENOUS

## 2016-11-07 NOTE — Progress Notes (Signed)
Tolerated infusion w/o adverse reaction.  Alert, in no distress.  Discharged ambulatory in c/o spouse.

## 2016-11-08 ENCOUNTER — Other Ambulatory Visit (HOSPITAL_COMMUNITY): Payer: Self-pay | Admitting: Adult Health

## 2016-11-08 DIAGNOSIS — E876 Hypokalemia: Secondary | ICD-10-CM

## 2016-11-08 DIAGNOSIS — C3491 Malignant neoplasm of unspecified part of right bronchus or lung: Secondary | ICD-10-CM

## 2016-11-08 MED ORDER — POTASSIUM CHLORIDE CRYS ER 20 MEQ PO TBCR: 20.0000 meq | EXTENDED_RELEASE_TABLET | Freq: Every day | ORAL | 0 refills | Status: DC

## 2016-11-12 ENCOUNTER — Other Ambulatory Visit: Payer: Self-pay | Admitting: Family Medicine

## 2016-11-13 ENCOUNTER — Other Ambulatory Visit: Payer: Self-pay | Admitting: Internal Medicine

## 2016-11-13 ENCOUNTER — Other Ambulatory Visit: Payer: Self-pay | Admitting: Family Medicine

## 2016-11-13 ENCOUNTER — Ambulatory Visit (HOSPITAL_COMMUNITY)
Admission: RE | Admit: 2016-11-13 | Discharge: 2016-11-13 | Disposition: A | Discharge status: Home / Self Care | Payer: Medicare Other | Source: Ambulatory Visit | Attending: Internal Medicine | Admitting: Internal Medicine

## 2016-11-13 DIAGNOSIS — I110 Hypertensive heart disease with heart failure: Secondary | ICD-10-CM | POA: Diagnosis not present

## 2016-11-13 DIAGNOSIS — E1142 Type 2 diabetes mellitus with diabetic polyneuropathy: Secondary | ICD-10-CM | POA: Diagnosis present

## 2016-11-13 DIAGNOSIS — Z716 Tobacco abuse counseling: Secondary | ICD-10-CM | POA: Diagnosis not present

## 2016-11-13 DIAGNOSIS — L97513 Non-pressure chronic ulcer of other part of right foot with necrosis of muscle: Secondary | ICD-10-CM | POA: Diagnosis present

## 2016-11-13 DIAGNOSIS — T148XXA Other injury of unspecified body region, initial encounter: Secondary | ICD-10-CM

## 2016-11-13 DIAGNOSIS — M19071 Primary osteoarthritis, right ankle and foot: Secondary | ICD-10-CM | POA: Diagnosis not present

## 2016-11-13 DIAGNOSIS — Z87891 Personal history of nicotine dependence: Secondary | ICD-10-CM | POA: Diagnosis not present

## 2016-11-13 DIAGNOSIS — J449 Chronic obstructive pulmonary disease, unspecified: Secondary | ICD-10-CM | POA: Diagnosis not present

## 2016-11-13 DIAGNOSIS — G4730 Sleep apnea, unspecified: Secondary | ICD-10-CM | POA: Diagnosis not present

## 2016-11-13 DIAGNOSIS — L97512 Non-pressure chronic ulcer of other part of right foot with fat layer exposed: Secondary | ICD-10-CM | POA: Diagnosis not present

## 2016-11-13 DIAGNOSIS — E11621 Type 2 diabetes mellitus with foot ulcer: Secondary | ICD-10-CM | POA: Diagnosis not present

## 2016-11-19 ENCOUNTER — Other Ambulatory Visit (HOSPITAL_COMMUNITY): Payer: Self-pay | Admitting: Oncology

## 2016-11-20 DIAGNOSIS — L97513 Non-pressure chronic ulcer of other part of right foot with necrosis of muscle: Secondary | ICD-10-CM | POA: Diagnosis not present

## 2016-11-20 DIAGNOSIS — E1142 Type 2 diabetes mellitus with diabetic polyneuropathy: Secondary | ICD-10-CM | POA: Diagnosis not present

## 2016-11-20 DIAGNOSIS — I110 Hypertensive heart disease with heart failure: Secondary | ICD-10-CM | POA: Diagnosis not present

## 2016-11-20 DIAGNOSIS — J449 Chronic obstructive pulmonary disease, unspecified: Secondary | ICD-10-CM | POA: Diagnosis not present

## 2016-11-20 DIAGNOSIS — G4730 Sleep apnea, unspecified: Secondary | ICD-10-CM | POA: Diagnosis not present

## 2016-11-20 DIAGNOSIS — E11621 Type 2 diabetes mellitus with foot ulcer: Secondary | ICD-10-CM | POA: Diagnosis not present

## 2016-11-20 DIAGNOSIS — L97512 Non-pressure chronic ulcer of other part of right foot with fat layer exposed: Secondary | ICD-10-CM | POA: Diagnosis not present

## 2016-11-21 ENCOUNTER — Encounter (HOSPITAL_BASED_OUTPATIENT_CLINIC_OR_DEPARTMENT_OTHER): Payer: Medicare Other

## 2016-11-21 ENCOUNTER — Encounter (HOSPITAL_COMMUNITY): Payer: Self-pay

## 2016-11-21 ENCOUNTER — Ambulatory Visit (HOSPITAL_COMMUNITY): Payer: Medicare Other

## 2016-11-21 VITALS — BP 126/63 | HR 72 | Temp 97.7°F | Resp 18 | Wt 176.0 lb

## 2016-11-21 DIAGNOSIS — C3491 Malignant neoplasm of unspecified part of right bronchus or lung: Secondary | ICD-10-CM | POA: Diagnosis not present

## 2016-11-21 DIAGNOSIS — Z9221 Personal history of antineoplastic chemotherapy: Secondary | ICD-10-CM | POA: Diagnosis not present

## 2016-11-21 DIAGNOSIS — Z5112 Encounter for antineoplastic immunotherapy: Secondary | ICD-10-CM | POA: Diagnosis present

## 2016-11-21 DIAGNOSIS — Z923 Personal history of irradiation: Secondary | ICD-10-CM | POA: Diagnosis not present

## 2016-11-21 DIAGNOSIS — Z79899 Other long term (current) drug therapy: Secondary | ICD-10-CM | POA: Diagnosis not present

## 2016-11-21 DIAGNOSIS — F17210 Nicotine dependence, cigarettes, uncomplicated: Secondary | ICD-10-CM | POA: Diagnosis not present

## 2016-11-21 DIAGNOSIS — R110 Nausea: Secondary | ICD-10-CM | POA: Diagnosis not present

## 2016-11-21 LAB — COMPREHENSIVE METABOLIC PANEL
ALT: 14 U/L — ABNORMAL LOW (ref 17–63)
ANION GAP: 9 (ref 5–15)
AST: 18 U/L (ref 15–41)
Albumin: 3.7 g/dL (ref 3.5–5.0)
Alkaline Phosphatase: 75 U/L (ref 38–126)
BUN: 18 mg/dL (ref 6–20)
CHLORIDE: 99 mmol/L — AB (ref 101–111)
CO2: 30 mmol/L (ref 22–32)
Calcium: 9.4 mg/dL (ref 8.9–10.3)
Creatinine, Ser: 0.77 mg/dL (ref 0.61–1.24)
Glucose, Bld: 158 mg/dL — ABNORMAL HIGH (ref 65–99)
POTASSIUM: 3.6 mmol/L (ref 3.5–5.1)
Sodium: 138 mmol/L (ref 135–145)
TOTAL PROTEIN: 7.2 g/dL (ref 6.5–8.1)
Total Bilirubin: 0.3 mg/dL (ref 0.3–1.2)

## 2016-11-21 LAB — CBC WITH DIFFERENTIAL/PLATELET
BASOS ABS: 0 10*3/uL (ref 0.0–0.1)
Basophils Relative: 0 %
EOS PCT: 2 %
Eosinophils Absolute: 0.2 10*3/uL (ref 0.0–0.7)
HCT: 39.1 % (ref 39.0–52.0)
Hemoglobin: 13.3 g/dL (ref 13.0–17.0)
LYMPHS PCT: 12 %
Lymphs Abs: 0.8 10*3/uL (ref 0.7–4.0)
MCH: 34.8 pg — AB (ref 26.0–34.0)
MCHC: 34 g/dL (ref 30.0–36.0)
MCV: 102.4 fL — AB (ref 78.0–100.0)
MONO ABS: 0.6 10*3/uL (ref 0.1–1.0)
MONOS PCT: 9 %
Neutro Abs: 5.4 10*3/uL (ref 1.7–7.7)
Neutrophils Relative %: 77 %
PLATELETS: 286 10*3/uL (ref 150–400)
RBC: 3.82 MIL/uL — ABNORMAL LOW (ref 4.22–5.81)
RDW: 13 % (ref 11.5–15.5)
WBC: 7 10*3/uL (ref 4.0–10.5)

## 2016-11-21 MED ORDER — SODIUM CHLORIDE 0.9 % IV SOLN
Freq: Once | INTRAVENOUS | Status: AC
  Administered 2016-11-21: 13:00:00 via INTRAVENOUS

## 2016-11-21 MED ORDER — SODIUM CHLORIDE 0.9% FLUSH
10.0000 mL | INTRAVENOUS | Status: DC | PRN
  Administered 2016-11-21: 10 mL
  Filled 2016-11-21: qty 10

## 2016-11-21 MED ORDER — SODIUM CHLORIDE 0.9 % IV SOLN
740.0000 mg | Freq: Once | INTRAVENOUS | Status: AC
  Administered 2016-11-21: 740 mg via INTRAVENOUS
  Filled 2016-11-21: qty 4.8

## 2016-11-21 MED ORDER — HEPARIN SOD (PORK) LOCK FLUSH 100 UNIT/ML IV SOLN
500.0000 [IU] | Freq: Once | INTRAVENOUS | Status: AC | PRN
Start: 2016-11-21 — End: 2016-11-21
  Administered 2016-11-21: 500 [IU]

## 2016-11-21 NOTE — Progress Notes (Signed)
Labs reviewed with [name] NP- proceed with treatment.  Chemotherapy given today, patient tolerated it well without problems. Vitals stable and discharged home from clinic ambulatory with wife. Follow up as scheduled.

## 2016-11-21 NOTE — Patient Instructions (Signed)
[facility] Discharge Instructions for Patients Receiving Chemotherapy   Beginning January 23rd 2017 lab work for the [facility] will be done in the  Main lab at [facility] on 1st floor. If you have a lab appointment with the [facility] please come in thru the  Main Entrance and check in at the main information desk   Today you received the following chemotherapy agents imfinzi  To help prevent nausea and vomiting after your treatment, we encourage you to take your nausea medication     If you develop nausea and vomiting, or diarrhea that is not controlled by your medication, call the clinic.  The clinic phone number is [phone]. Office hours are Monday-Friday 8:30am-5:00pm.  BELOW ARE SYMPTOMS THAT SHOULD BE REPORTED IMMEDIATELY:  *FEVER GREATER THAN 101.0 F  *CHILLS WITH OR WITHOUT FEVER  NAUSEA AND VOMITING THAT IS NOT CONTROLLED WITH YOUR NAUSEA MEDICATION  *UNUSUAL SHORTNESS OF BREATH  *UNUSUAL BRUISING OR BLEEDING  TENDERNESS IN MOUTH AND THROAT WITH OR WITHOUT PRESENCE OF ULCERS  *URINARY PROBLEMS  *BOWEL PROBLEMS  UNUSUAL RASH Items with * indicate a potential emergency and should be followed up as soon as possible. If you have an emergency after office hours please contact your primary care physician or go to the nearest emergency department.  Please call the clinic during office hours if you have any questions or concerns.   You may also contact the Patient Navigator at [phone] should you have any questions or need assistance in obtaining follow up care.      Resources For Cancer Patients and their Caregivers ? American Cancer Society: Can assist with transportation, wigs, general needs, runs Look Good Feel Better.        ([phone] ? Cancer Care: Provides financial assistance, online support groups, medication/co-pay assistance.  1-800-813-HOPE 3473428963) ? [facility] Assists [place]  cancer patients and their families through emotional , educational and financial support.  986 395 0504 ? [place] DSS Where to apply for food stamps, Medicaid and utility assistance. 782 884 9571 ? [facility]: Transportation to medical appointments. 769-492-2510 ? Social Security Administration: May apply for disability if have a Stage IV cancer. [phone] 7690486037 ? [employer], Disability and Transit Services: Assists with nutrition, care and transit needs. 973-404-7515

## 2016-11-27 DIAGNOSIS — J449 Chronic obstructive pulmonary disease, unspecified: Secondary | ICD-10-CM | POA: Diagnosis not present

## 2016-11-27 DIAGNOSIS — L97512 Non-pressure chronic ulcer of other part of right foot with fat layer exposed: Secondary | ICD-10-CM | POA: Diagnosis not present

## 2016-11-27 DIAGNOSIS — G4730 Sleep apnea, unspecified: Secondary | ICD-10-CM | POA: Diagnosis not present

## 2016-11-27 DIAGNOSIS — L97513 Non-pressure chronic ulcer of other part of right foot with necrosis of muscle: Secondary | ICD-10-CM | POA: Diagnosis not present

## 2016-11-27 DIAGNOSIS — E11621 Type 2 diabetes mellitus with foot ulcer: Secondary | ICD-10-CM | POA: Diagnosis not present

## 2016-11-27 DIAGNOSIS — I110 Hypertensive heart disease with heart failure: Secondary | ICD-10-CM | POA: Diagnosis not present

## 2016-11-27 DIAGNOSIS — E1142 Type 2 diabetes mellitus with diabetic polyneuropathy: Secondary | ICD-10-CM | POA: Diagnosis not present

## 2016-12-02 ENCOUNTER — Other Ambulatory Visit (HOSPITAL_COMMUNITY): Payer: Self-pay | Admitting: Oncology

## 2016-12-04 DIAGNOSIS — J449 Chronic obstructive pulmonary disease, unspecified: Secondary | ICD-10-CM | POA: Diagnosis not present

## 2016-12-04 DIAGNOSIS — E11621 Type 2 diabetes mellitus with foot ulcer: Secondary | ICD-10-CM | POA: Diagnosis not present

## 2016-12-04 DIAGNOSIS — L97512 Non-pressure chronic ulcer of other part of right foot with fat layer exposed: Secondary | ICD-10-CM | POA: Diagnosis not present

## 2016-12-04 DIAGNOSIS — G4730 Sleep apnea, unspecified: Secondary | ICD-10-CM | POA: Diagnosis not present

## 2016-12-04 DIAGNOSIS — L97513 Non-pressure chronic ulcer of other part of right foot with necrosis of muscle: Secondary | ICD-10-CM | POA: Diagnosis not present

## 2016-12-04 DIAGNOSIS — I110 Hypertensive heart disease with heart failure: Secondary | ICD-10-CM | POA: Diagnosis not present

## 2016-12-04 DIAGNOSIS — E1142 Type 2 diabetes mellitus with diabetic polyneuropathy: Secondary | ICD-10-CM | POA: Diagnosis not present

## 2016-12-05 ENCOUNTER — Encounter (HOSPITAL_COMMUNITY): Payer: Medicare Other | Attending: Adult Health

## 2016-12-05 ENCOUNTER — Encounter (HOSPITAL_COMMUNITY): Payer: Self-pay

## 2016-12-05 ENCOUNTER — Encounter (HOSPITAL_BASED_OUTPATIENT_CLINIC_OR_DEPARTMENT_OTHER): Payer: Medicare Other | Admitting: Adult Health

## 2016-12-05 VITALS — BP 115/65 | HR 88 | Temp 97.6°F | Resp 18 | Wt 172.4 lb

## 2016-12-05 DIAGNOSIS — C3491 Malignant neoplasm of unspecified part of right bronchus or lung: Secondary | ICD-10-CM | POA: Insufficient documentation

## 2016-12-05 DIAGNOSIS — Z716 Tobacco abuse counseling: Secondary | ICD-10-CM | POA: Diagnosis not present

## 2016-12-05 DIAGNOSIS — Z5112 Encounter for antineoplastic immunotherapy: Secondary | ICD-10-CM | POA: Diagnosis present

## 2016-12-05 DIAGNOSIS — R110 Nausea: Secondary | ICD-10-CM

## 2016-12-05 DIAGNOSIS — F17200 Nicotine dependence, unspecified, uncomplicated: Secondary | ICD-10-CM

## 2016-12-05 LAB — COMPREHENSIVE METABOLIC PANEL
ALT: 13 U/L — ABNORMAL LOW (ref 17–63)
AST: 18 U/L (ref 15–41)
Albumin: 3.9 g/dL (ref 3.5–5.0)
Alkaline Phosphatase: 76 U/L (ref 38–126)
Anion gap: 9 (ref 5–15)
BILIRUBIN TOTAL: 0.4 mg/dL (ref 0.3–1.2)
BUN: 24 mg/dL — ABNORMAL HIGH (ref 6–20)
CO2: 27 mmol/L (ref 22–32)
Calcium: 9.2 mg/dL (ref 8.9–10.3)
Chloride: 101 mmol/L (ref 101–111)
Creatinine, Ser: 0.77 mg/dL (ref 0.61–1.24)
Glucose, Bld: 144 mg/dL — ABNORMAL HIGH (ref 65–99)
POTASSIUM: 3.7 mmol/L (ref 3.5–5.1)
Sodium: 137 mmol/L (ref 135–145)
TOTAL PROTEIN: 7.2 g/dL (ref 6.5–8.1)

## 2016-12-05 LAB — CBC WITH DIFFERENTIAL/PLATELET
BASOS ABS: 0 10*3/uL (ref 0.0–0.1)
BASOS PCT: 0 %
EOS PCT: 2 %
Eosinophils Absolute: 0.1 10*3/uL (ref 0.0–0.7)
HCT: 41 % (ref 39.0–52.0)
Hemoglobin: 13.9 g/dL (ref 13.0–17.0)
Lymphocytes Relative: 9 %
Lymphs Abs: 0.6 10*3/uL — ABNORMAL LOW (ref 0.7–4.0)
MCH: 34.2 pg — AB (ref 26.0–34.0)
MCHC: 33.9 g/dL (ref 30.0–36.0)
MCV: 100.7 fL — ABNORMAL HIGH (ref 78.0–100.0)
MONO ABS: 0.8 10*3/uL (ref 0.1–1.0)
Monocytes Relative: 12 %
Neutro Abs: 5.2 10*3/uL (ref 1.7–7.7)
Neutrophils Relative %: 77 %
PLATELETS: 300 10*3/uL (ref 150–400)
RBC: 4.07 MIL/uL — ABNORMAL LOW (ref 4.22–5.81)
RDW: 12.8 % (ref 11.5–15.5)
WBC: 6.7 10*3/uL (ref 4.0–10.5)

## 2016-12-05 MED ORDER — SODIUM CHLORIDE 0.9 % IV SOLN
9.5000 mg/kg | Freq: Once | INTRAVENOUS | Status: AC
  Administered 2016-12-05: 740 mg via INTRAVENOUS
  Filled 2016-12-05: qty 4.8

## 2016-12-05 MED ORDER — SODIUM CHLORIDE 0.9% FLUSH
10.0000 mL | INTRAVENOUS | Status: DC | PRN
  Administered 2016-12-05: 10 mL
  Filled 2016-12-05: qty 10

## 2016-12-05 MED ORDER — HEPARIN SOD (PORK) LOCK FLUSH 100 UNIT/ML IV SOLN
500.0000 [IU] | Freq: Once | INTRAVENOUS | Status: AC | PRN
  Administered 2016-12-05: 500 [IU]
  Filled 2016-12-05: qty 5

## 2016-12-05 MED ORDER — VARENICLINE TARTRATE 0.5 MG X 11 & 1 MG X 42 PO MISC: ORAL | 0 refills | Status: DC

## 2016-12-05 MED ORDER — PROCHLORPERAZINE MALEATE 10 MG PO TABS: 10.0000 mg | ORAL_TABLET | Freq: Four times a day (QID) | ORAL | 2 refills | Status: DC | PRN

## 2016-12-05 MED ORDER — SODIUM CHLORIDE 0.9 % IV SOLN
Freq: Once | INTRAVENOUS | Status: AC
  Administered 2016-12-05: 500 mL via INTRAVENOUS

## 2016-12-05 NOTE — Progress Notes (Signed)
Patient for chemotherapy today.  Stated fingers seem to have a slight increase in tingling.  Described as "briars" in the tips.  Increased fatigue and more frequent nausea.  Using extra zofran with noted constipation.  Using stool softeners and miralax.  Using inhaler for SOB but no worsening per patient.  To be seen by [name], NP, today.  Patient denied pain today.   Labs reviewed with [name], NP.  Ok to treat today and to encourage the patient to drink fluids due to BUN 24.     Medication list reviewed with the patient.   Patient tolerated chemotherapy with no complaints voiced.  Port site clean and dry with no bruising or swelling noted at site.  Band aid applied.  VSS with discharge and left ambulatory with wife.

## 2016-12-05 NOTE — Progress Notes (Signed)
[name], [place] [id number]   CLINIC:  Medical Oncology/Hematology  PCP:  [name], MD [place] [phone]   REASON FOR VISIT:  Follow-up for Stage IIB/IIIA NSCLC (T1b, N1-2, M0) right lung   CURRENT THERAPY:  Maintenance Imfinzi every 2 weeks, beginning 09/26/16   BRIEF ONCOLOGIC HISTORY:    Non-small cell lung cancer, right ([facility])   04/17/2016 Imaging    CT chest- 1. Examination is positive for bilateral upper lobe pulmonary lesions suspicious for malignancy. Further investigation with PET-CT and tissue sampling is advised. 2. Borderline enlarged paratracheal lymph nodes and right hilar nodes. Cannot rule out metastatic adenopathy. Attention to these areas on PET-CT is advised. 3. Emphysema 4. Aortic atherosclerosis and multi vessel coronary artery calcification.      04/18/2016 Initial Diagnosis    Adenocarcinoma of lung, right ([facility])     04/24/2016 PET scan    1. Adjacent hypermetabolic nodules in the RIGHT upper lobe consistent bronchogenic carcinoma. 2. Suspicion of ipsilateral hilar and paratracheal nodal metastasis. 3. Nodule in the LEFT upper lobe with suspicious morphology has a relatively low metabolic activity for size. Favor synchronous primary bronchogenic carcinoma. 4. No metabolic activity associated with a small LEFT lower lobe pulmonary nodule. Recommend attention on follow-up.      05/06/2016 Imaging    MRI brain- Negative for metastatic disease.      05/28/2016 Procedure    1. Electromagnetic navigational bronchoscopy with brushings, needle     aspirations and biopsies of bilateral upper lobe nodules. 2. Endobronchial ultrasound with mediastinal and hilar lymph node     aspirations. By Dr. [name]      06/03/2016 Pathology Results    Diagnosis 1. Lung, biopsy, Right upper lobe - BLOOD AND BENIGN BRONCHIAL AND LUNG TISSUE. 2. Lung, biopsy, Left upper lobe - BLOOD  AND BENIGN BRONCHIAL AND LUNG TISSUE.      06/03/2016 Pathology Results    Diagnosis TRANSBRONCHIAL NEEDLE ASPIRATION, NAVIGATION, RUL (SPECIMEN 5 OF 7, COLLECTED 05/28/16): MALIGNANT CELLS CONSISTENT WITH NON-SMALL CELL CARCINOMA Comment There are limited tumor cells in the cell block (insufficient for molecular testing). TTF-1 is positive in a few of the atypical cells. They appear negative for synaptophysin and cytokeratin 5/6. Overall there is limited tumor, but a lung adenocarcinoma is slightly favored. Dr. [name] has reviewed the case. The case was called to Dr. [name] on 06/03/2016.      06/13/2016 Procedure    Port placed by Dr. Arnoldo Morale      06/25/2016 - 08/15/2016 Chemotherapy    The patient had palonosetron (ALOXI) injection 0.25 mg, 0.25 mg, Intravenous,  Once, 6 of 6 cycles  CARBOplatin (PARAPLATIN) 220 mg in sodium chloride 0.9 % 250 mL chemo infusion, 220 mg (100 % of original dose 215.8 mg), Intravenous,  Once, 6 of 6 cycles Dose modification:   (original dose 215.8 mg, Cycle 1),   (original dose 215.8 mg, Cycle 5),   (original dose 215.8 mg, Cycle 6),   (original dose 215.8 mg, Cycle 2),   (original dose 215.8 mg, Cycle 3),   (original dose 215.8 mg, Cycle 4)  PACLitaxel (TAXOL) 90 mg in dextrose 5 % 250 mL chemo infusion ( for chemotherapy treatment.        06/25/2016 - 08/20/2016 Radiation Therapy    Radiation in [place], [place] by Dr. Quitman Livings.  R lung to 6600 cGy      07/09/2016 - 07/10/2016 Hospital Admission  Admit date: 07/09/2016  Admission diagnosis: Fever and chills Additional comments: Negative work-up, discharged with course of Levaquin      07/09/2016 Treatment Plan Change    Treatment deferred x 1 week due to hospitalization       08/05/2016 - 08/06/2016 Hospital Admission    Admit date: 08/05/2016 Admission diagnosis: Joint pain Additional comments: Treated with steroids with symptomatic improvement      08/25/2016 Imaging    MRI brain- Negative for  intracranial metastasis on this motion degraded examination.  Stable mild-to-moderate chronic small vessel ischemic disease.      09/18/2016 PET scan    1. Partial metabolic response. Right upper lobe 2.6 cm hypermetabolic pulmonary nodule, decreased in size and metabolism. Mildly hypermetabolic right hilar nodal metastasis, decreased in metabolism. Right paratracheal lymph node is no longer hypermetabolic. No new or progressive metastatic disease. 2. Apical left upper lobe 0.9 cm solid pulmonary nodule, slightly decreased in size, with no significant metabolism. The change in size could indicate a neoplastic etiology for this nodule. 3. Additional subcentimeter pulmonary nodules in the left lung are stable and below PET resolution . 4. Additional findings include aortic atherosclerosis, coronary atherosclerosis, moderate emphysema, stable small pericardial effusion/thickening and mild sigmoid diverticulosis.        INTERVAL HISTORY:  [name] 71 y.o. male returns for follow-up and for consideration of next cycle of therapy.   Due for cycle #6 maintenance/consolidative Imfinzi.   Overall, he tells me he has been feeling well.  He feels tired/fatigued quite a bit, his nausea is a bit worse, and he has some constipation, but otherwise he states that "I feel pretty good."  He thinks his constipation is d/t the Zofran, so he doesn't take it very often. For his constipation he uses stool softener and Miralax.   He has diabetes and has an open wound to his (R) great toe, which he is receiving wound care for in [place]; states that his blood sugars have been running in the 150s. He has chronic peripheral neuropathy, which pre-dates his cancer diagnosis. States that it is largely "about the same'; he takes gabapentin.   He has some shortness of breath, which has been chronic and is largely unchanged/stable. He is still smoking about 1 ppd of cigarettes; he is very interested in  quitting smoking and wants to talk more about that today.   Overall, he feels ready for his next cycle of Imfinzi today.      REVIEW OF SYSTEMS:  Review of Systems  Constitutional: Positive for fatigue. Negative for chills and fever.  HENT:  Negative.   Eyes: Negative.   Respiratory: Positive for shortness of breath. Negative for cough.   Cardiovascular: Negative.  Negative for chest pain.  Gastrointestinal: Positive for constipation and nausea. Negative for abdominal pain, blood in stool, diarrhea and vomiting.  Endocrine: Negative.   Genitourinary: Negative.  Negative for dysuria and hematuria.   Musculoskeletal: Negative.   Skin: Positive for wound.  Neurological: Positive for numbness. Negative for dizziness and headaches.  Hematological: Negative.   Psychiatric/Behavioral: Negative.      PAST MEDICAL/SURGICAL HISTORY:  Past Medical History:  Diagnosis Date  . Adenocarcinoma of lung, right ([facility]) 04/18/2016  . Anxiety   . Arthritis   . CHF (congestive heart failure) ([facility])   . COPD (chronic obstructive pulmonary disease) ([facility])   . Depression   . Diabetes mellitus without complication ([facility])    no meds  . DM (diabetes mellitus) ([facility]) 07/09/2016  . Dyspnea   .  History of kidney stones   . Hyperlipidemia   . Hypertension   . Macular degeneration   . Neuropathy   . Non-small cell lung cancer, right ([facility]) 04/18/2016  . Prostatitis   . Pulmonary nodule, left 07/16/2016  . Sleep apnea    cpap   Past Surgical History:  Procedure Laterality Date  . CATARACT EXTRACTION, BILATERAL Bilateral   . NO PAST SURGERIES    . PORTACATH PLACEMENT Left 06/13/2016   Procedure: INSERTION PORT-A-CATH;  Surgeon: Aviva Signs, MD;  Location: [facility];  Service: General;  Laterality: Left;  Marland Kitchen VIDEO BRONCHOSCOPY WITH ENDOBRONCHIAL NAVIGATION N/A 05/28/2016   Procedure: VIDEO BRONCHOSCOPY WITH ENDOBRONCHIAL NAVIGATION;  Surgeon: [name], MD;  Location: [facility];  Service: Thoracic;   Laterality: N/A;  . VIDEO BRONCHOSCOPY WITH ENDOBRONCHIAL ULTRASOUND N/A 05/28/2016   Procedure: VIDEO BRONCHOSCOPY WITH ENDOBRONCHIAL ULTRASOUND;  Surgeon: [name], MD;  Location: [facility];  Service: Thoracic;  Laterality: N/A;     SOCIAL HISTORY:  Social History   Social History  . Marital status: Divorced    Spouse name: N/A  . Number of children: N/A  . Years of education: N/A   Occupational History  . Not on file.   Social History Main Topics  . Smoking status: Current Every Day Smoker    Packs/day: 0.50    Years: 55.00    Types: Cigarettes    Start date: 03/11/1961  . Smokeless tobacco: Never Used  . Alcohol use No  . Drug use: No  . Sexual activity: Yes    Birth control/ protection: None   Other Topics Concern  . Not on file   Social History Narrative  . No narrative on file    FAMILY HISTORY:  Family History  Problem Relation Age of Onset  . Hypertension Mother   . Diabetes Father   . Heart disease Father   . Hypertension Sister     CURRENT MEDICATIONS:  Outpatient Encounter Prescriptions as of 12/05/2016  Medication Sig Note  . acetaminophen (TYLENOL) 325 MG tablet Take 2 tablets (650 mg total) by mouth every 6 (six) hours as needed for mild pain (or Fever >/= 101).   Marland Kitchen albuterol (PROVENTIL HFA;VENTOLIN HFA) 108 (90 Base) MCG/ACT inhaler Inhale 2 puffs into the lungs every 6 (six) hours as needed for wheezing or shortness of breath.   Marland Kitchen aspirin EC 81 MG tablet Take 81 mg by mouth daily.   . cloNIDine (CATAPRES) 0.1 MG tablet TAKE 1 TABLET (0.1 MG TOTAL) BY MOUTH 3 (THREE) TIMES DAILY. 12/05/2016: Taking 2 times a day.  [place] (IMFINZI IV) Inject into the vein. Every 2 weeks   . finasteride (PROSCAR) 5 MG tablet TAKE 1 TABLET EVERY DAY   . fluticasone (FLONASE) 50 MCG/ACT nasal spray Place 2 sprays into both nostrils daily. 12/05/2016: As needed  . Fluticasone-Salmeterol (ADVAIR DISKUS IN) Inhale 1 puff into the lungs 2 (two) times daily.  12/05/2016: As needed  . gabapentin (NEURONTIN) 300 MG capsule TAKE 1 CAPSULE BY MOUTH THREE TIMES A DAY   . HYDROcodone-acetaminophen (NORCO/VICODIN) 5-325 MG tablet Take 2 tablets by mouth every 4 (four) hours as needed. 12/05/2016: As needed  . ibuprofen (ADVIL,MOTRIN) 600 MG tablet Take 1 tablet (600 mg total) by mouth every 6 (six) hours as needed.   . INCRUSE ELLIPTA 62.5 MCG/INH AEPB INHALE 1 PUFF INTO THE LUNGS DAILY.   Marland Kitchen irbesartan-hydrochlorothiazide (AVALIDE) 300-12.5 MG tablet TAKE 1 TABLET BY MOUTH DAILY.   Marland Kitchen LORazepam (ATIVAN)  0.5 MG tablet TAKE 1 TABLET BY MOUTH TWICE A DAY AS NEEDED   . magic mouthwash w/lidocaine SOLN USE 1 TEASPOONFUL BY 15MINS BEFORE MEALS AND AS NEEDED FOR [place] AND SWALLOW 12/05/2016: As needed  . omeprazole (PRILOSEC) 20 MG capsule Take 20 mg by mouth daily.    . ondansetron (ZOFRAN) 8 MG tablet Take 1 tablet (8 mg total) by mouth every 8 (eight) hours as needed for nausea or vomiting.   . potassium chloride SA (K-DUR,KLOR-CON) 20 MEQ tablet Take 1 tablet (20 mEq total) by mouth daily.   . pravastatin (PRAVACHOL) 40 MG tablet Take 40 mg by mouth every 3 (three) days. M-W-F   . prochlorperazine (COMPAZINE) 10 MG tablet Take 1 tablet (10 mg total) by mouth every 6 (six) hours as needed for nausea or vomiting.   . tamsulosin (FLOMAX) 0.4 MG CAPS capsule Take 0.4 mg by mouth daily after breakfast.   . TRADJENTA 5 MG TABS tablet TAKE 1 TABLET (5 MG TOTAL) BY MOUTH DAILY.   . traZODone (DESYREL) 50 MG tablet Take 1 tablet (50 mg total) by mouth at bedtime as needed for sleep.   . varenicline (CHANTIX STARTING MONTH PAK) 0.5 MG X 11 & 1 MG X 42 tablet Take one 0.5 mg tablet by mouth once daily for 3 days, then increase to one 0.5 mg tablet twice daily for 4 days, then increase to one 1 mg tablet twice daily.   . [DISCONTINUED] fentaNYL (DURAGESIC - DOSED MCG/HR) 12 MCG/HR Place 1 patch (12.5 mcg total) onto the skin every 3 (three) days.    No  facility-administered encounter medications on file as of 12/05/2016.     ALLERGIES:  No Known Allergies   PHYSICAL EXAM:  ECOG Performance status: 1 - Symptomatic, but independent.      Physical Exam  Constitutional: He is oriented to person, place, and time and well-developed, well-nourished, and in no distress.  Seen in chemo chair in infusion area   HENT:  Head: Normocephalic.  Mouth/Throat: Oropharynx is clear and moist.  Eyes: Conjunctivae are normal. No scleral icterus.  Neck: Normal range of motion. Neck supple.  Cardiovascular: Normal rate and regular rhythm.   Pulmonary/Chest: Effort normal. No respiratory distress. He has no wheezes.  Diminished breath sounds to bilat bases  Abdominal: Soft. Bowel sounds are normal. There is no tenderness.  Musculoskeletal: Normal range of motion. He exhibits no edema.  (R) foot in walking boot  Lymphadenopathy:    He has no cervical adenopathy.       Right: No supraclavicular adenopathy present.       Left: No supraclavicular adenopathy present.  Neurological: He is alert and oriented to person, place, and time. No cranial nerve deficit.  Skin: Skin is warm and dry. No rash noted.  Psychiatric: Mood, memory, affect and judgment normal.  Nursing note and vitals reviewed.    LABORATORY DATA:  I have reviewed the labs as listed.  CBC    Component Value Date/Time   WBC 6.7 12/05/2016 1049   RBC 4.07 (L) 12/05/2016 1049   HGB 13.9 12/05/2016 1049   HGB 9.6 (L) 08/28/2016 1117   HCT 41.0 12/05/2016 1049   HCT 27.7 (L) 08/28/2016 1117   PLT 300 12/05/2016 1049   PLT 268 08/28/2016 1117   MCV 100.7 (H) 12/05/2016 1049   MCV 96 08/28/2016 1117   MCH 34.2 (H) 12/05/2016 1049   MCHC 33.9 12/05/2016 1049   RDW 12.8 12/05/2016 1049   RDW 19.4 (  H) 08/28/2016 1117   LYMPHSABS 0.6 (L) 12/05/2016 1049   LYMPHSABS 0.6 (L) 08/28/2016 1117   MONOABS 0.8 12/05/2016 1049   EOSABS 0.1 12/05/2016 1049   EOSABS 0.0 08/28/2016 1117    BASOSABS 0.0 12/05/2016 1049   BASOSABS 0.0 08/28/2016 1117   CMP Latest Ref Rng & Units 12/05/2016 11/21/2016 11/07/2016  Glucose 65 - 99 mg/dL 144(H) 158(H) 145(H)  BUN 6 - 20 mg/dL 24(H) 18 15  Creatinine 0.61 - 1.24 mg/dL 0.77 0.77 0.68  Sodium 135 - 145 mmol/L 137 138 139  Potassium 3.5 - 5.1 mmol/L 3.7 3.6 3.4(L)  Chloride 101 - 111 mmol/L 101 99(L) 102  CO2 22 - 32 mmol/L 27 30 29   Calcium 8.9 - 10.3 mg/dL 9.2 9.4 8.9  Total Protein 6.5 - 8.1 g/dL 7.2 7.2 6.9  Total Bilirubin 0.3 - 1.2 mg/dL 0.4 0.3 0.3  Alkaline Phos 38 - 126 U/L 76 75 70  AST 15 - 41 U/L 18 18 17   ALT 17 - 63 U/L 13(L) 14(L) 11(L)     PENDING LABS:    DIAGNOSTIC IMAGING:  PET scan: 09/18/16 CLINICAL DATA:  Subsequent treatment strategy for stage IIB (T1b N1 M0) right upper lobe non-small cell lung carcinoma status post concurrent chemoradiation therapy completed 08/20/2016.  EXAM: NUCLEAR MEDICINE PET SKULL BASE TO THIGH  TECHNIQUE: 8.5 mCi F-18 FDG was injected intravenously. Full-ring PET imaging was performed from the skull base to thigh after the radiotracer. CT data was obtained and used for attenuation correction and anatomic localization.  FASTING BLOOD GLUCOSE:  Value: 151 mg/dl  COMPARISON:  04/24/2016 PET-CT.  FINDINGS: NECK  No hypermetabolic lymph nodes in the neck.  CHEST  Hypermetabolic irregular right upper lobe 2.6 x 2.2 cm pulmonary nodule with max SUV 9.4 (series 7/image 16), previously 3.5 x 2.7 cm with max SUV 18.0, decreased in size and metabolism.  Right hilar node with borderline mild hypermetabolism (max SUV 3.2), decreased in metabolism (previous max SUV 4.4).  No additional enlarged or hypermetabolic hilar, mediastinal or axillary nodes. Previously described mildly hypermetabolic right paratracheal node is no longer hypermetabolic.  Apical left upper lobe 0.9 cm solid pulmonary nodule (series 7/image 9) does not demonstrate significant metabolism  (max SUV 0.8), slightly decreased from 1.1 cm.  Left upper lobe 4 mm (series 7/image 23) and left lower lobe 6 mm (series 7/image 38) solid pulmonary nodules are stable, below PET resolution and not associated with significant metabolism. No acute consolidative airspace disease or new significant pulmonary nodules.  Moderate centrilobular emphysema with diffuse bronchial wall thickening.  Left subclavian MediPort terminates in the middle third of the superior vena cava. Atherosclerotic nonaneurysmal thoracic aorta. Coronary atherosclerosis. Stable small pericardial effusion/thickening. No pneumothorax. No pleural effusions.  ABDOMEN/PELVIS  No abnormal hypermetabolic activity within the liver, pancreas, adrenal glands, or spleen. No hypermetabolic lymph nodes in the abdomen or pelvis. Scattered granulomatous calcifications in the liver are unchanged. Partially exophytic simple 3.7 cm posterior interpolar right renal cyst. Atherosclerotic nonaneurysmal abdominal aorta. Mild sigmoid diverticulosis. Mildly enlarged prostate.  SKELETON  No focal hypermetabolic activity to suggest skeletal metastasis.  IMPRESSION: 1. Partial metabolic response. Right upper lobe 2.6 cm hypermetabolic pulmonary nodule, decreased in size and metabolism. Mildly hypermetabolic right hilar nodal metastasis, decreased in metabolism. Right paratracheal lymph node is no longer hypermetabolic. No new or progressive metastatic disease. 2. Apical left upper lobe 0.9 cm solid pulmonary nodule, slightly decreased in size, with no significant metabolism. The change in size could indicate a  neoplastic etiology for this nodule. 3. Additional subcentimeter pulmonary nodules in the left lung are stable and below PET resolution . 4. Additional findings include aortic atherosclerosis, coronary atherosclerosis, moderate emphysema, stable small pericardial effusion/thickening and mild sigmoid  diverticulosis.   Electronically Signed   By: Ilona Sorrel M.D.   On: 09/18/2016 10:21     PATHOLOGY:  [place]: 05/28/16     ASSESSMENT & PLAN:   Stage IIB/IIIA NSCLC (T1b, N1-2, M0) right lung:  -s/p concurrent chemoradiation x 6 cycles of weekly Carbo/Taxol; which he completed on 08/15/16.  Restaging PET scan in 09/2016 showed partial metabolic response to treatment and consolidative/maintenance therapy with Imfinzi started on 09/26/16.   -Due for cycle #6 Imfinzi today; labs reviewed and are adequate for treatment. We have been monitoring TSH for immune-mediate thyroid dysfunction, which has been normal thus far.   -He will be due for surveillance/restaging CT chest in ~6 weeks; orders placed today.  -Continue Imfinzi every 2 weeks as scheduled.  -Return to cancer center in ~6 weeks for follow-up visit to review CT results and subsequent treatment.   Nausea without vomiting:  -Zofran leads to worsening constipation for patient. Therefore, recommended he try Compazine to help with symptoms. While nausea is not an extremely common side effect of immunotherapy, it is possibly related and/or related to his constipation.  Compazine is a less constipating anti-emetic; e-scribed Compazine 10 mg PRN to his pharmacy.   Tobacco use disorder:  -We discussed the pathophysiology of nicotine dependence and different strategies to stop smoking.  The gold standard of tobacco cessation is nicotine replacement (with nicotine patches & gum/lozenges) or Varenicline (Chantix).  We discussed that the typical craving/urge to smoke lasts about 3-5 minutes; his biggest trigger(s) to use cigarettes is sitting on his porch.  Encouraged him to set some new "rules" that do not allow him to smoke during certain hours on his porch (limiting his exposure to high trigger areas); instead he could use a piece of nicotine gum or a lozenge when she has a craving.He is concerned about how expensive the patches/lozenges are  and is interested in trying Chantix, as it is typically covered by Medicare/Medicaid.   John [name]  understands that data suggests that "cold [place]" is the least effective way to stop using tobacco products.  Having a clear "quit plan" is much more effective and requires a step-wise approach with continued support from a tobacco treatment specialist. We worked on some goal setting today and he is interested in making Thanksgiving Day his "Quit Day."  Paper prescription for starter pack of Chantix given to patient today.  I encouraged him to reach out to me if he has further questions or interest in his continued cessation efforts.  Greater than 10 minutes was spent in smoking cessation counseling with this patient.         Dispo:  -Continue Imfinzi every 2 weeks.  -Return to cancer center in ~6 weeks for follow-up visit with subsequent treatment.    All questions were answered to patient's stated satisfaction. Encouraged patient to call with any new concerns or questions before his next visit to the cancer center and we can certain see him sooner, if needed.    Plan of care discussed with Dr. Twana First, who agrees with the above aforementioned.     Orders placed this encounter:  Orders Placed This Encounter  Procedures  . CT Chest W Contrast      [name], NP [facility] [phone]

## 2016-12-05 NOTE — Patient Instructions (Signed)
[facility] Discharge Instructions for Patients Receiving Chemotherapy  Today you received the following chemotherapy agents Opdivo.  Call for any questions or concerns.  Keep next scheduled appointment.   To help prevent nausea and vomiting after your treatment, we encourage you to take your nausea medication.     If you develop nausea and vomiting that is not controlled by your nausea medication, call the clinic.   BELOW ARE SYMPTOMS THAT SHOULD BE REPORTED IMMEDIATELY:  *FEVER GREATER THAN 100.5 F  *CHILLS WITH OR WITHOUT FEVER  NAUSEA AND VOMITING THAT IS NOT CONTROLLED WITH YOUR NAUSEA MEDICATION  *UNUSUAL SHORTNESS OF BREATH  *UNUSUAL BRUISING OR BLEEDING  TENDERNESS IN MOUTH AND THROAT WITH OR WITHOUT PRESENCE OF ULCERS  *URINARY PROBLEMS  *BOWEL PROBLEMS  UNUSUAL RASH Items with * indicate a potential emergency and should be followed up as soon as possible.  Feel free to call the clinic you have any questions or concerns. The clinic phone number is [phone].  Please show the [facility] at check-in to the Emergency Department and triage nurse.

## 2016-12-06 ENCOUNTER — Encounter (HOSPITAL_COMMUNITY): Payer: Self-pay | Admitting: Adult Health

## 2016-12-09 ENCOUNTER — Other Ambulatory Visit (HOSPITAL_COMMUNITY): Payer: Self-pay | Admitting: Adult Health

## 2016-12-09 DIAGNOSIS — C3491 Malignant neoplasm of unspecified part of right bronchus or lung: Secondary | ICD-10-CM

## 2016-12-09 DIAGNOSIS — E876 Hypokalemia: Secondary | ICD-10-CM

## 2016-12-11 ENCOUNTER — Encounter (HOSPITAL_BASED_OUTPATIENT_CLINIC_OR_DEPARTMENT_OTHER): Payer: Medicare Other | Attending: Internal Medicine

## 2016-12-11 DIAGNOSIS — E11621 Type 2 diabetes mellitus with foot ulcer: Secondary | ICD-10-CM | POA: Diagnosis not present

## 2016-12-11 DIAGNOSIS — I509 Heart failure, unspecified: Secondary | ICD-10-CM | POA: Diagnosis not present

## 2016-12-11 DIAGNOSIS — Z923 Personal history of irradiation: Secondary | ICD-10-CM | POA: Diagnosis not present

## 2016-12-11 DIAGNOSIS — L84 Corns and callosities: Secondary | ICD-10-CM | POA: Diagnosis not present

## 2016-12-11 DIAGNOSIS — Z9221 Personal history of antineoplastic chemotherapy: Secondary | ICD-10-CM | POA: Insufficient documentation

## 2016-12-11 DIAGNOSIS — F17210 Nicotine dependence, cigarettes, uncomplicated: Secondary | ICD-10-CM | POA: Insufficient documentation

## 2016-12-11 DIAGNOSIS — I110 Hypertensive heart disease with heart failure: Secondary | ICD-10-CM | POA: Diagnosis not present

## 2016-12-11 DIAGNOSIS — G4730 Sleep apnea, unspecified: Secondary | ICD-10-CM | POA: Insufficient documentation

## 2016-12-11 DIAGNOSIS — J449 Chronic obstructive pulmonary disease, unspecified: Secondary | ICD-10-CM | POA: Insufficient documentation

## 2016-12-11 DIAGNOSIS — L97513 Non-pressure chronic ulcer of other part of right foot with necrosis of muscle: Secondary | ICD-10-CM | POA: Diagnosis not present

## 2016-12-11 DIAGNOSIS — E1142 Type 2 diabetes mellitus with diabetic polyneuropathy: Secondary | ICD-10-CM | POA: Insufficient documentation

## 2016-12-11 DIAGNOSIS — L97512 Non-pressure chronic ulcer of other part of right foot with fat layer exposed: Secondary | ICD-10-CM | POA: Diagnosis not present

## 2016-12-13 ENCOUNTER — Other Ambulatory Visit: Payer: Self-pay | Admitting: Family Medicine

## 2016-12-18 DIAGNOSIS — L97513 Non-pressure chronic ulcer of other part of right foot with necrosis of muscle: Secondary | ICD-10-CM | POA: Diagnosis not present

## 2016-12-18 DIAGNOSIS — L84 Corns and callosities: Secondary | ICD-10-CM | POA: Diagnosis not present

## 2016-12-18 DIAGNOSIS — J449 Chronic obstructive pulmonary disease, unspecified: Secondary | ICD-10-CM | POA: Diagnosis not present

## 2016-12-18 DIAGNOSIS — F17210 Nicotine dependence, cigarettes, uncomplicated: Secondary | ICD-10-CM | POA: Diagnosis not present

## 2016-12-18 DIAGNOSIS — E11621 Type 2 diabetes mellitus with foot ulcer: Secondary | ICD-10-CM | POA: Diagnosis not present

## 2016-12-18 DIAGNOSIS — L97512 Non-pressure chronic ulcer of other part of right foot with fat layer exposed: Secondary | ICD-10-CM | POA: Diagnosis not present

## 2016-12-18 DIAGNOSIS — E1142 Type 2 diabetes mellitus with diabetic polyneuropathy: Secondary | ICD-10-CM | POA: Diagnosis not present

## 2016-12-19 ENCOUNTER — Other Ambulatory Visit: Payer: Self-pay | Admitting: Family Medicine

## 2016-12-19 ENCOUNTER — Ambulatory Visit (HOSPITAL_COMMUNITY): Payer: Medicare Other

## 2016-12-19 ENCOUNTER — Encounter (HOSPITAL_COMMUNITY): Payer: Medicare Other

## 2016-12-22 ENCOUNTER — Encounter (HOSPITAL_COMMUNITY): Payer: Self-pay

## 2016-12-22 ENCOUNTER — Ambulatory Visit (HOSPITAL_COMMUNITY)
Admission: RE | Admit: 2016-12-22 | Discharge: 2016-12-22 | Disposition: A | Discharge status: Home / Self Care | Payer: Medicare Other | Source: Ambulatory Visit | Attending: Internal Medicine | Admitting: Internal Medicine

## 2016-12-22 ENCOUNTER — Encounter (HOSPITAL_COMMUNITY): Payer: Medicare Other | Attending: Oncology

## 2016-12-22 ENCOUNTER — Other Ambulatory Visit: Payer: Self-pay | Admitting: Internal Medicine

## 2016-12-22 VITALS — BP 142/72 | HR 75 | Temp 98.2°F | Resp 16 | Wt 176.8 lb

## 2016-12-22 DIAGNOSIS — Z5112 Encounter for antineoplastic immunotherapy: Secondary | ICD-10-CM | POA: Diagnosis present

## 2016-12-22 DIAGNOSIS — R0602 Shortness of breath: Secondary | ICD-10-CM | POA: Insufficient documentation

## 2016-12-22 DIAGNOSIS — X58XXXD Exposure to other specified factors, subsequent encounter: Secondary | ICD-10-CM | POA: Diagnosis not present

## 2016-12-22 DIAGNOSIS — S81801D Unspecified open wound, right lower leg, subsequent encounter: Secondary | ICD-10-CM

## 2016-12-22 DIAGNOSIS — Z5111 Encounter for antineoplastic chemotherapy: Secondary | ICD-10-CM | POA: Diagnosis not present

## 2016-12-22 DIAGNOSIS — C3491 Malignant neoplasm of unspecified part of right bronchus or lung: Secondary | ICD-10-CM | POA: Diagnosis not present

## 2016-12-22 DIAGNOSIS — R739 Hyperglycemia, unspecified: Secondary | ICD-10-CM | POA: Insufficient documentation

## 2016-12-22 DIAGNOSIS — R300 Dysuria: Secondary | ICD-10-CM | POA: Diagnosis not present

## 2016-12-22 DIAGNOSIS — M79674 Pain in right toe(s): Secondary | ICD-10-CM | POA: Diagnosis not present

## 2016-12-22 LAB — CBC WITH DIFFERENTIAL/PLATELET
BASOS ABS: 0 10*3/uL (ref 0.0–0.1)
Basophils Relative: 0 %
EOS PCT: 2 %
Eosinophils Absolute: 0.1 10*3/uL (ref 0.0–0.7)
HEMATOCRIT: 40.3 % (ref 39.0–52.0)
Hemoglobin: 13.9 g/dL (ref 13.0–17.0)
LYMPHS PCT: 11 %
Lymphs Abs: 0.8 10*3/uL (ref 0.7–4.0)
MCH: 33.7 pg (ref 26.0–34.0)
MCHC: 34.5 g/dL (ref 30.0–36.0)
MCV: 97.8 fL (ref 78.0–100.0)
Monocytes Absolute: 0.7 10*3/uL (ref 0.1–1.0)
Monocytes Relative: 9 %
NEUTROS ABS: 5.6 10*3/uL (ref 1.7–7.7)
Neutrophils Relative %: 78 %
PLATELETS: 283 10*3/uL (ref 150–400)
RBC: 4.12 MIL/uL — AB (ref 4.22–5.81)
RDW: 12.7 % (ref 11.5–15.5)
WBC: 7.1 10*3/uL (ref 4.0–10.5)

## 2016-12-22 LAB — COMPREHENSIVE METABOLIC PANEL
ALBUMIN: 3.6 g/dL (ref 3.5–5.0)
ALT: 15 U/L — ABNORMAL LOW (ref 17–63)
AST: 17 U/L (ref 15–41)
Alkaline Phosphatase: 80 U/L (ref 38–126)
Anion gap: 9 (ref 5–15)
BUN: 15 mg/dL (ref 6–20)
CHLORIDE: 99 mmol/L — AB (ref 101–111)
CO2: 29 mmol/L (ref 22–32)
CREATININE: 0.71 mg/dL (ref 0.61–1.24)
Calcium: 9.6 mg/dL (ref 8.9–10.3)
GFR calc Af Amer: >60 mL/min (ref >60)
Glucose, Bld: 148 mg/dL — ABNORMAL HIGH (ref 65–99)
Potassium: 3.6 mmol/L (ref 3.5–5.1)
Sodium: 137 mmol/L (ref 135–145)
Total Bilirubin: 0.3 mg/dL (ref 0.3–1.2)
Total Protein: 7.1 g/dL (ref 6.5–8.1)

## 2016-12-22 MED ORDER — HEPARIN SOD (PORK) LOCK FLUSH 100 UNIT/ML IV SOLN
500.0000 [IU] | Freq: Once | INTRAVENOUS | Status: AC | PRN
  Administered 2016-12-22: 500 [IU]
  Filled 2016-12-22: qty 5

## 2016-12-22 MED ORDER — SODIUM CHLORIDE 0.9% FLUSH
10.0000 mL | INTRAVENOUS | Status: DC | PRN
  Administered 2016-12-22: 10 mL
  Filled 2016-12-22: qty 10

## 2016-12-22 MED ORDER — SODIUM CHLORIDE 0.9 % IV SOLN
9.5000 mg/kg | Freq: Once | INTRAVENOUS | Status: AC
  Administered 2016-12-22: 740 mg via INTRAVENOUS
  Filled 2016-12-22: qty 14.8

## 2016-12-22 MED ORDER — SODIUM CHLORIDE 0.9 % IV SOLN
Freq: Once | INTRAVENOUS | Status: AC
  Administered 2016-12-22: 14:00:00 via INTRAVENOUS

## 2016-12-22 NOTE — Progress Notes (Signed)
Labs reviewed with MD. Proceed with treatment.  Treatment given per orders. Patient tolerated it well without problems. Vitals stable and discharged home from clinic ambulatory. Follow up as scheduled.

## 2016-12-22 NOTE — Patient Instructions (Signed)
[facility] Discharge Instructions for Patients Receiving Chemotherapy   Beginning January 23rd 2017 lab work for the Cancer Center will be done in the  Main lab at [facility] on 1st floor. If you have a lab appointment with the Cancer Center please come in thru the  Main Entrance and check in at the main information desk   Today you received the following chemotherapy agents   To help prevent nausea and vomiting after your treatment, we encourage you to take your nausea medication     If you develop nausea and vomiting, or diarrhea that is not controlled by your medication, call the clinic.  The clinic phone number is [phone]. Office hours are Monday-Friday 8:30am-5:00pm.  BELOW ARE SYMPTOMS THAT SHOULD BE REPORTED IMMEDIATELY:  *FEVER GREATER THAN 101.0 F  *CHILLS WITH OR WITHOUT FEVER  NAUSEA AND VOMITING THAT IS NOT CONTROLLED WITH YOUR NAUSEA MEDICATION  *UNUSUAL SHORTNESS OF BREATH  *UNUSUAL BRUISING OR BLEEDING  TENDERNESS IN MOUTH AND THROAT WITH OR WITHOUT PRESENCE OF ULCERS  *URINARY PROBLEMS  *BOWEL PROBLEMS  UNUSUAL RASH Items with * indicate a potential emergency and should be followed up as soon as possible. If you have an emergency after office hours please contact your primary care physician or go to the nearest emergency department.  Please call the clinic during office hours if you have any questions or concerns.   You may also contact the Patient Navigator at [phone] should you have any questions or need assistance in obtaining follow up care.      Resources For Cancer Patients and their Caregivers ? American Cancer Society: Can assist with transportation, wigs, general needs, runs Look Good Feel Better.        1-888-227-6333 ? Cancer Care: Provides financial assistance, online support groups, medication/co-pay assistance.  1-800-813-HOPE (4673) ? [facility] Assists [place] cancer  patients and their families through emotional , educational and financial support.  336-427-4357 ? [place] DSS Where to apply for food stamps, Medicaid and utility assistance. 336-342-1394 ? [facility]: Transportation to medical appointments. 336-347-2287 ? Social Security Administration: May apply for disability if have a Stage IV cancer. 336-342-7796 1-800-772-1213 ? [place] Aging, Disability and Transit Services: Assists with nutrition, care and transit needs. 336-349-2343         

## 2016-12-25 ENCOUNTER — Other Ambulatory Visit: Payer: Self-pay

## 2016-12-25 ENCOUNTER — Ambulatory Visit (INDEPENDENT_AMBULATORY_CARE_PROVIDER_SITE_OTHER): Payer: Medicare Other

## 2016-12-25 ENCOUNTER — Other Ambulatory Visit: Payer: Self-pay | Admitting: Internal Medicine

## 2016-12-25 DIAGNOSIS — E11621 Type 2 diabetes mellitus with foot ulcer: Secondary | ICD-10-CM | POA: Diagnosis not present

## 2016-12-25 DIAGNOSIS — L97513 Non-pressure chronic ulcer of other part of right foot with necrosis of muscle: Secondary | ICD-10-CM | POA: Diagnosis not present

## 2016-12-25 DIAGNOSIS — I3139 Other pericardial effusion (noninflammatory): Secondary | ICD-10-CM

## 2016-12-25 DIAGNOSIS — L97512 Non-pressure chronic ulcer of other part of right foot with fat layer exposed: Secondary | ICD-10-CM | POA: Diagnosis not present

## 2016-12-25 DIAGNOSIS — L84 Corns and callosities: Secondary | ICD-10-CM | POA: Diagnosis not present

## 2016-12-25 DIAGNOSIS — I313 Pericardial effusion (noninflammatory): Secondary | ICD-10-CM | POA: Diagnosis not present

## 2016-12-25 DIAGNOSIS — M869 Osteomyelitis, unspecified: Secondary | ICD-10-CM

## 2016-12-25 DIAGNOSIS — F17210 Nicotine dependence, cigarettes, uncomplicated: Secondary | ICD-10-CM | POA: Diagnosis not present

## 2016-12-25 DIAGNOSIS — E1142 Type 2 diabetes mellitus with diabetic polyneuropathy: Secondary | ICD-10-CM | POA: Diagnosis not present

## 2016-12-25 DIAGNOSIS — J449 Chronic obstructive pulmonary disease, unspecified: Secondary | ICD-10-CM | POA: Diagnosis not present

## 2016-12-25 LAB — ECHOCARDIOGRAM LIMITED
FS: 32 % (ref 28–44)
IV/PV OW: 0.85
LA vol A4C: 62.9 ml
LA vol index: 25.2 mL/m2
LADIAMINDEX: 2.05 cm/m2
LASIZE: 39 mm
LAVOL: 47.9 mL
LEFT ATRIUM END SYS DIAM: 39 mm
LV PW d: 9.16 mm — AB (ref 0.6–1.1)
LV dias vol: 64 mL (ref 62–150)
LV sys vol index: 14 mL/m2
LV sys vol: 26 mL (ref 21–61)
LVDIAVOLIN: 34 mL/m2
Simpson's disk: 60
Stroke v: 38 ml

## 2016-12-31 ENCOUNTER — Ambulatory Visit (HOSPITAL_COMMUNITY)
Admission: RE | Admit: 2016-12-31 | Discharge: 2016-12-31 | Disposition: A | Discharge status: Home / Self Care | Payer: Medicare Other | Source: Ambulatory Visit | Attending: Internal Medicine | Admitting: Internal Medicine

## 2016-12-31 ENCOUNTER — Telehealth: Payer: Self-pay | Admitting: *Deleted

## 2016-12-31 DIAGNOSIS — L97501 Non-pressure chronic ulcer of other part of unspecified foot limited to breakdown of skin: Secondary | ICD-10-CM | POA: Diagnosis not present

## 2016-12-31 DIAGNOSIS — L97513 Non-pressure chronic ulcer of other part of right foot with necrosis of muscle: Secondary | ICD-10-CM | POA: Diagnosis not present

## 2016-12-31 DIAGNOSIS — M869 Osteomyelitis, unspecified: Secondary | ICD-10-CM

## 2016-12-31 MED ORDER — GADOBENATE DIMEGLUMINE 529 MG/ML IV SOLN
15.0000 mL | Freq: Once | INTRAVENOUS | Status: AC | PRN
  Administered 2016-12-31: 15 mL via INTRAVENOUS

## 2016-12-31 NOTE — Telephone Encounter (Signed)
Notes recorded by Laurine Blazer, LPN on 9/35/5217 at 4:71 AM EDT Patient notified. Copy to pmd. ------  Notes recorded by Herminio Commons, MD on 12/25/2016 at 5:22 PM EDT Normal pumping function. No significant fluid collection around the heart.

## 2017-01-01 DIAGNOSIS — L97513 Non-pressure chronic ulcer of other part of right foot with necrosis of muscle: Secondary | ICD-10-CM | POA: Diagnosis not present

## 2017-01-01 DIAGNOSIS — L84 Corns and callosities: Secondary | ICD-10-CM | POA: Diagnosis not present

## 2017-01-01 DIAGNOSIS — L97512 Non-pressure chronic ulcer of other part of right foot with fat layer exposed: Secondary | ICD-10-CM | POA: Diagnosis not present

## 2017-01-01 DIAGNOSIS — E1142 Type 2 diabetes mellitus with diabetic polyneuropathy: Secondary | ICD-10-CM | POA: Diagnosis not present

## 2017-01-01 DIAGNOSIS — J449 Chronic obstructive pulmonary disease, unspecified: Secondary | ICD-10-CM | POA: Diagnosis not present

## 2017-01-01 DIAGNOSIS — F17210 Nicotine dependence, cigarettes, uncomplicated: Secondary | ICD-10-CM | POA: Diagnosis not present

## 2017-01-01 DIAGNOSIS — E11621 Type 2 diabetes mellitus with foot ulcer: Secondary | ICD-10-CM | POA: Diagnosis not present

## 2017-01-02 ENCOUNTER — Ambulatory Visit (HOSPITAL_COMMUNITY): Payer: Medicare Other

## 2017-01-02 ENCOUNTER — Encounter (HOSPITAL_COMMUNITY): Payer: Medicare Other

## 2017-01-05 ENCOUNTER — Encounter (HOSPITAL_COMMUNITY): Payer: Self-pay

## 2017-01-05 ENCOUNTER — Encounter (HOSPITAL_COMMUNITY): Payer: Medicare Other | Attending: Oncology

## 2017-01-05 VITALS — BP 124/62 | HR 101 | Temp 98.0°F | Resp 16 | Wt 175.0 lb

## 2017-01-05 DIAGNOSIS — R0602 Shortness of breath: Secondary | ICD-10-CM | POA: Insufficient documentation

## 2017-01-05 DIAGNOSIS — Z5112 Encounter for antineoplastic immunotherapy: Secondary | ICD-10-CM

## 2017-01-05 DIAGNOSIS — R300 Dysuria: Secondary | ICD-10-CM | POA: Insufficient documentation

## 2017-01-05 DIAGNOSIS — R739 Hyperglycemia, unspecified: Secondary | ICD-10-CM | POA: Insufficient documentation

## 2017-01-05 DIAGNOSIS — Z5111 Encounter for antineoplastic chemotherapy: Secondary | ICD-10-CM | POA: Insufficient documentation

## 2017-01-05 DIAGNOSIS — C3491 Malignant neoplasm of unspecified part of right bronchus or lung: Secondary | ICD-10-CM | POA: Diagnosis not present

## 2017-01-05 LAB — COMPREHENSIVE METABOLIC PANEL
ALBUMIN: 3.4 g/dL — AB (ref 3.5–5.0)
ALK PHOS: 80 U/L (ref 38–126)
ALT: 14 U/L — AB (ref 17–63)
ANION GAP: 10 (ref 5–15)
AST: 15 U/L (ref 15–41)
BUN: 18 mg/dL (ref 6–20)
CALCIUM: 9.4 mg/dL (ref 8.9–10.3)
CO2: 28 mmol/L (ref 22–32)
CREATININE: 0.77 mg/dL (ref 0.61–1.24)
Chloride: 99 mmol/L — ABNORMAL LOW (ref 101–111)
GFR calc Af Amer: >60 mL/min (ref >60)
GFR calc non Af Amer: >60 mL/min (ref >60)
GLUCOSE: 121 mg/dL — AB (ref 65–99)
Potassium: 3.8 mmol/L (ref 3.5–5.1)
SODIUM: 137 mmol/L (ref 135–145)
Total Bilirubin: 0.4 mg/dL (ref 0.3–1.2)
Total Protein: 7.1 g/dL (ref 6.5–8.1)

## 2017-01-05 LAB — CBC WITH DIFFERENTIAL/PLATELET
BASOS ABS: 0 10*3/uL (ref 0.0–0.1)
BASOS PCT: 0 %
EOS ABS: 0.1 10*3/uL (ref 0.0–0.7)
Eosinophils Relative: 2 %
HEMATOCRIT: 38.8 % — AB (ref 39.0–52.0)
HEMOGLOBIN: 13.3 g/dL (ref 13.0–17.0)
Lymphocytes Relative: 11 %
Lymphs Abs: 0.7 10*3/uL (ref 0.7–4.0)
MCH: 33.1 pg (ref 26.0–34.0)
MCHC: 34.3 g/dL (ref 30.0–36.0)
MCV: 96.5 fL (ref 78.0–100.0)
Monocytes Absolute: 0.6 10*3/uL (ref 0.1–1.0)
Monocytes Relative: 9 %
NEUTROS ABS: 5 10*3/uL (ref 1.7–7.7)
NEUTROS PCT: 78 %
Platelets: 330 10*3/uL (ref 150–400)
RBC: 4.02 MIL/uL — AB (ref 4.22–5.81)
RDW: 12.6 % (ref 11.5–15.5)
WBC: 6.4 10*3/uL (ref 4.0–10.5)

## 2017-01-05 MED ORDER — SODIUM CHLORIDE 0.9 % IV SOLN
740.0000 mg | Freq: Once | INTRAVENOUS | Status: AC
  Administered 2017-01-05: 740 mg via INTRAVENOUS
  Filled 2017-01-05: qty 4.8

## 2017-01-05 MED ORDER — HEPARIN SOD (PORK) LOCK FLUSH 100 UNIT/ML IV SOLN
500.0000 [IU] | Freq: Once | INTRAVENOUS | Status: AC | PRN
  Administered 2017-01-05: 500 [IU]
  Filled 2017-01-05: qty 5

## 2017-01-05 MED ORDER — SODIUM CHLORIDE 0.9 % IV SOLN
Freq: Once | INTRAVENOUS | Status: AC
  Administered 2017-01-05: 14:00:00 via INTRAVENOUS

## 2017-01-05 MED ORDER — SODIUM CHLORIDE 0.9% FLUSH
10.0000 mL | INTRAVENOUS | Status: DC | PRN
  Administered 2017-01-05: 10 mL
  Filled 2017-01-05: qty 10

## 2017-01-05 NOTE — Progress Notes (Signed)
Labs reviewed with Dr. Talbert Cage. Ok to treat today.   Patient tolerated chemotherapy with no complaints voiced.  Port site clean and dry with no bruising or swelling noted.  Band aid applied.  VSS with discharge and left ambulatory.  No s/s of distress noted.

## 2017-01-05 NOTE — Patient Instructions (Signed)
[facility] Discharge Instructions for Patients Receiving Chemotherapy  Today you received the following chemotherapy agents imfinizi.  Call for any questions or concerns.  Keep all scheduled appointments.   To help prevent nausea and vomiting after your treatment, we encourage you to take your nausea medication.   If you develop nausea and vomiting that is not controlled by your nausea medication, call the clinic.   BELOW ARE SYMPTOMS THAT SHOULD BE REPORTED IMMEDIATELY:  *FEVER GREATER THAN 100.5 F  *CHILLS WITH OR WITHOUT FEVER  NAUSEA AND VOMITING THAT IS NOT CONTROLLED WITH YOUR NAUSEA MEDICATION  *UNUSUAL SHORTNESS OF BREATH  *UNUSUAL BRUISING OR BLEEDING  TENDERNESS IN MOUTH AND THROAT WITH OR WITHOUT PRESENCE OF ULCERS  *URINARY PROBLEMS  *BOWEL PROBLEMS  UNUSUAL RASH Items with * indicate a potential emergency and should be followed up as soon as possible.  Feel free to call the clinic should you have any questions or concerns. The clinic phone number is [phone].  Please show the [place] at check-in to the Emergency Department and triage nurse.

## 2017-01-06 DIAGNOSIS — L84 Corns and callosities: Secondary | ICD-10-CM | POA: Diagnosis not present

## 2017-01-06 DIAGNOSIS — B351 Tinea unguium: Secondary | ICD-10-CM | POA: Diagnosis not present

## 2017-01-06 DIAGNOSIS — M79676 Pain in unspecified toe(s): Secondary | ICD-10-CM | POA: Diagnosis not present

## 2017-01-06 DIAGNOSIS — E1142 Type 2 diabetes mellitus with diabetic polyneuropathy: Secondary | ICD-10-CM | POA: Diagnosis not present

## 2017-01-07 ENCOUNTER — Other Ambulatory Visit (HOSPITAL_COMMUNITY): Payer: Self-pay | Admitting: Adult Health

## 2017-01-07 DIAGNOSIS — C3491 Malignant neoplasm of unspecified part of right bronchus or lung: Secondary | ICD-10-CM

## 2017-01-07 DIAGNOSIS — E876 Hypokalemia: Secondary | ICD-10-CM

## 2017-01-09 ENCOUNTER — Ambulatory Visit (HOSPITAL_COMMUNITY): Payer: Medicare Other

## 2017-01-09 ENCOUNTER — Encounter (HOSPITAL_COMMUNITY): Payer: Medicare Other

## 2017-01-09 ENCOUNTER — Other Ambulatory Visit: Payer: Self-pay | Admitting: Family Medicine

## 2017-01-11 ENCOUNTER — Other Ambulatory Visit: Payer: Self-pay | Admitting: Family Medicine

## 2017-01-13 ENCOUNTER — Ambulatory Visit (HOSPITAL_COMMUNITY)
Admission: RE | Admit: 2017-01-13 | Discharge: 2017-01-13 | Disposition: A | Discharge status: Home / Self Care | Payer: Medicare Other | Source: Ambulatory Visit | Attending: Adult Health | Admitting: Adult Health

## 2017-01-13 DIAGNOSIS — C3490 Malignant neoplasm of unspecified part of unspecified bronchus or lung: Secondary | ICD-10-CM | POA: Diagnosis not present

## 2017-01-13 DIAGNOSIS — C3491 Malignant neoplasm of unspecified part of right bronchus or lung: Secondary | ICD-10-CM | POA: Insufficient documentation

## 2017-01-13 MED ORDER — IOPAMIDOL (ISOVUE-300) INJECTION 61%
75.0000 mL | Freq: Once | INTRAVENOUS | Status: AC | PRN
  Administered 2017-01-13: 75 mL via INTRAVENOUS

## 2017-01-15 ENCOUNTER — Other Ambulatory Visit (HOSPITAL_COMMUNITY): Payer: Self-pay | Admitting: *Deleted

## 2017-01-15 ENCOUNTER — Encounter (HOSPITAL_BASED_OUTPATIENT_CLINIC_OR_DEPARTMENT_OTHER): Payer: Medicare Other | Attending: Internal Medicine

## 2017-01-15 DIAGNOSIS — Z923 Personal history of irradiation: Secondary | ICD-10-CM | POA: Diagnosis not present

## 2017-01-15 DIAGNOSIS — I110 Hypertensive heart disease with heart failure: Secondary | ICD-10-CM | POA: Diagnosis not present

## 2017-01-15 DIAGNOSIS — E1140 Type 2 diabetes mellitus with diabetic neuropathy, unspecified: Secondary | ICD-10-CM | POA: Insufficient documentation

## 2017-01-15 DIAGNOSIS — G4730 Sleep apnea, unspecified: Secondary | ICD-10-CM | POA: Diagnosis not present

## 2017-01-15 DIAGNOSIS — L97512 Non-pressure chronic ulcer of other part of right foot with fat layer exposed: Secondary | ICD-10-CM | POA: Insufficient documentation

## 2017-01-15 DIAGNOSIS — F17210 Nicotine dependence, cigarettes, uncomplicated: Secondary | ICD-10-CM | POA: Diagnosis not present

## 2017-01-15 DIAGNOSIS — E1142 Type 2 diabetes mellitus with diabetic polyneuropathy: Secondary | ICD-10-CM | POA: Diagnosis not present

## 2017-01-15 DIAGNOSIS — J449 Chronic obstructive pulmonary disease, unspecified: Secondary | ICD-10-CM | POA: Diagnosis not present

## 2017-01-15 DIAGNOSIS — I509 Heart failure, unspecified: Secondary | ICD-10-CM | POA: Diagnosis not present

## 2017-01-15 DIAGNOSIS — C3411 Malignant neoplasm of upper lobe, right bronchus or lung: Secondary | ICD-10-CM | POA: Diagnosis not present

## 2017-01-15 DIAGNOSIS — E11621 Type 2 diabetes mellitus with foot ulcer: Secondary | ICD-10-CM | POA: Insufficient documentation

## 2017-01-15 DIAGNOSIS — Z79899 Other long term (current) drug therapy: Secondary | ICD-10-CM | POA: Insufficient documentation

## 2017-01-16 ENCOUNTER — Ambulatory Visit (HOSPITAL_COMMUNITY): Payer: Medicare Other

## 2017-01-23 ENCOUNTER — Encounter (HOSPITAL_BASED_OUTPATIENT_CLINIC_OR_DEPARTMENT_OTHER): Payer: Medicare Other

## 2017-01-23 ENCOUNTER — Encounter (HOSPITAL_BASED_OUTPATIENT_CLINIC_OR_DEPARTMENT_OTHER): Payer: Medicare Other | Admitting: Oncology

## 2017-01-23 ENCOUNTER — Encounter (HOSPITAL_COMMUNITY): Payer: Medicare Other

## 2017-01-23 VITALS — BP 141/67 | HR 97 | Temp 98.0°F | Resp 18 | Wt 175.4 lb

## 2017-01-23 VITALS — BP 126/57 | HR 94 | Temp 98.0°F | Resp 18

## 2017-01-23 DIAGNOSIS — C3491 Malignant neoplasm of unspecified part of right bronchus or lung: Secondary | ICD-10-CM

## 2017-01-23 DIAGNOSIS — Z5112 Encounter for antineoplastic immunotherapy: Secondary | ICD-10-CM | POA: Diagnosis present

## 2017-01-23 DIAGNOSIS — E1140 Type 2 diabetes mellitus with diabetic neuropathy, unspecified: Secondary | ICD-10-CM | POA: Diagnosis not present

## 2017-01-23 DIAGNOSIS — R300 Dysuria: Secondary | ICD-10-CM | POA: Diagnosis not present

## 2017-01-23 DIAGNOSIS — F17218 Nicotine dependence, cigarettes, with other nicotine-induced disorders: Secondary | ICD-10-CM

## 2017-01-23 DIAGNOSIS — R0602 Shortness of breath: Secondary | ICD-10-CM | POA: Diagnosis not present

## 2017-01-23 DIAGNOSIS — Z23 Encounter for immunization: Secondary | ICD-10-CM

## 2017-01-23 DIAGNOSIS — R739 Hyperglycemia, unspecified: Secondary | ICD-10-CM | POA: Diagnosis not present

## 2017-01-23 DIAGNOSIS — Z5111 Encounter for antineoplastic chemotherapy: Secondary | ICD-10-CM | POA: Diagnosis not present

## 2017-01-23 LAB — CBC WITH DIFFERENTIAL/PLATELET
BASOS ABS: 0 10*3/uL (ref 0.0–0.1)
BASOS PCT: 0 %
EOS ABS: 0.2 10*3/uL (ref 0.0–0.7)
Eosinophils Relative: 3 %
HCT: 37.9 % — ABNORMAL LOW (ref 39.0–52.0)
HEMOGLOBIN: 13.1 g/dL (ref 13.0–17.0)
Lymphocytes Relative: 10 %
Lymphs Abs: 0.7 10*3/uL (ref 0.7–4.0)
MCH: 33.6 pg (ref 26.0–34.0)
MCHC: 34.6 g/dL (ref 30.0–36.0)
MCV: 97.2 fL (ref 78.0–100.0)
MONOS PCT: 13 %
Monocytes Absolute: 0.8 10*3/uL (ref 0.1–1.0)
Neutro Abs: 4.8 10*3/uL (ref 1.7–7.7)
Neutrophils Relative %: 74 %
Platelets: 269 10*3/uL (ref 150–400)
RBC: 3.9 MIL/uL — ABNORMAL LOW (ref 4.22–5.81)
RDW: 13.7 % (ref 11.5–15.5)
WBC: 6.5 10*3/uL (ref 4.0–10.5)

## 2017-01-23 LAB — COMPREHENSIVE METABOLIC PANEL
ALBUMIN: 3.6 g/dL (ref 3.5–5.0)
ALK PHOS: 78 U/L (ref 38–126)
ALT: 14 U/L — ABNORMAL LOW (ref 17–63)
ANION GAP: 9 (ref 5–15)
AST: 18 U/L (ref 15–41)
BUN: 17 mg/dL (ref 6–20)
CALCIUM: 9.1 mg/dL (ref 8.9–10.3)
CO2: 28 mmol/L (ref 22–32)
Chloride: 100 mmol/L — ABNORMAL LOW (ref 101–111)
Creatinine, Ser: 0.72 mg/dL (ref 0.61–1.24)
GFR calc Af Amer: >60 mL/min (ref >60)
GFR calc non Af Amer: >60 mL/min (ref >60)
GLUCOSE: 127 mg/dL — AB (ref 65–99)
Potassium: 3.5 mmol/L (ref 3.5–5.1)
SODIUM: 137 mmol/L (ref 135–145)
Total Bilirubin: 0.3 mg/dL (ref 0.3–1.2)
Total Protein: 7.2 g/dL (ref 6.5–8.1)

## 2017-01-23 MED ORDER — INFLUENZA VAC SPLIT QUAD 0.5 ML IM SUSY
PREFILLED_SYRINGE | INTRAMUSCULAR | Status: AC
  Filled 2017-01-23: qty 0.5

## 2017-01-23 MED ORDER — SODIUM CHLORIDE 0.9 % IV SOLN
Freq: Once | INTRAVENOUS | Status: AC
  Administered 2017-01-23: 13:00:00 via INTRAVENOUS

## 2017-01-23 MED ORDER — SODIUM CHLORIDE 0.9 % IV SOLN
9.5000 mg/kg | Freq: Once | INTRAVENOUS | Status: AC
  Administered 2017-01-23: 740 mg via INTRAVENOUS
  Filled 2017-01-23: qty 10

## 2017-01-23 MED ORDER — HEPARIN SOD (PORK) LOCK FLUSH 100 UNIT/ML IV SOLN
500.0000 [IU] | Freq: Once | INTRAVENOUS | Status: AC | PRN
  Administered 2017-01-23: 500 [IU]
  Filled 2017-01-23: qty 5

## 2017-01-23 MED ORDER — SODIUM CHLORIDE 0.9% FLUSH
10.0000 mL | INTRAVENOUS | Status: DC | PRN
  Administered 2017-01-23: 10 mL
  Filled 2017-01-23: qty 10

## 2017-01-23 MED ORDER — INFLUENZA VAC SPLIT QUAD 0.5 ML IM SUSY
0.5000 mL | PREFILLED_SYRINGE | Freq: Once | INTRAMUSCULAR | Status: AC
  Administered 2017-01-23: 0.5 mL via INTRAMUSCULAR

## 2017-01-23 NOTE — Progress Notes (Signed)
John [name], John [name]   CLINIC:  Medical Oncology/Hematology  PCP:  [name], MD [place] [id number] [phone]   REASON FOR VISIT:  Follow-up for Stage IIB/IIIA NSCLC (T1b, N1-2, M0) right lung   CURRENT THERAPY:  Maintenance Imfinzi every 2 weeks, beginning 09/26/16   BRIEF ONCOLOGIC HISTORY:    Non-small cell lung cancer, right ([place])   04/17/2016 Imaging    CT chest- 1. Examination is positive for bilateral upper lobe pulmonary lesions suspicious for malignancy. Further investigation with PET-CT and tissue sampling is advised. 2. Borderline enlarged paratracheal lymph nodes and right hilar nodes. Cannot rule out metastatic adenopathy. Attention to these areas on PET-CT is advised. 3. Emphysema 4. Aortic atherosclerosis and multi vessel coronary artery calcification.      04/18/2016 Initial Diagnosis    Adenocarcinoma of lung, right ([place])     04/24/2016 PET scan    1. Adjacent hypermetabolic nodules in the RIGHT upper lobe consistent bronchogenic carcinoma. 2. Suspicion of ipsilateral hilar and paratracheal nodal metastasis. 3. Nodule in the LEFT upper lobe with suspicious morphology has a relatively low metabolic activity for size. Favor synchronous primary bronchogenic carcinoma. 4. No metabolic activity associated with a small LEFT lower lobe pulmonary nodule. Recommend attention on follow-up.      05/06/2016 Imaging    MRI brain- Negative for metastatic disease.      05/28/2016 Procedure    1. Electromagnetic navigational bronchoscopy with brushings, needle     aspirations and biopsies of bilateral upper lobe nodules. 2. Endobronchial ultrasound with mediastinal and hilar lymph node     aspirations. By Dr. [name]      06/03/2016 Pathology Results    Diagnosis 1. Lung, biopsy, Right upper lobe - BLOOD AND BENIGN BRONCHIAL AND LUNG TISSUE. 2. Lung, biopsy, Left upper lobe - BLOOD  AND BENIGN BRONCHIAL AND LUNG TISSUE.      06/03/2016 Pathology Results    Diagnosis TRANSBRONCHIAL NEEDLE ASPIRATION, NAVIGATION, RUL (SPECIMEN 5 OF 7, COLLECTED 05/28/16): MALIGNANT CELLS CONSISTENT WITH NON-SMALL CELL CARCINOMA Comment There are limited tumor cells in the cell block (insufficient for molecular testing). TTF-1 is positive in a few of the atypical cells. They appear negative for synaptophysin and cytokeratin 5/6. Overall there is limited tumor, but a lung adenocarcinoma is slightly favored. Dr. [name] has reviewed the case. The case was called to Dr. [name] on 06/03/2016.      06/13/2016 Procedure    Port placed by Dr. Arnoldo Morale      06/25/2016 - 08/15/2016 Chemotherapy    The patient had palonosetron (ALOXI) injection 0.25 mg, 0.25 mg, Intravenous,  Once, 6 of 6 cycles  CARBOplatin (PARAPLATIN) 220 mg in sodium chloride 0.9 % 250 mL chemo infusion, 220 mg (100 % of original dose 215.8 mg), Intravenous,  Once, 6 of 6 cycles Dose modification:   (original dose 215.8 mg, Cycle 1),   (original dose 215.8 mg, Cycle 5),   (original dose 215.8 mg, Cycle 6),   (original dose 215.8 mg, Cycle 2),   (original dose 215.8 mg, Cycle 3),   (original dose 215.8 mg, Cycle 4)  PACLitaxel (TAXOL) 90 mg in dextrose 5 % 250 mL chemo infusion ( for chemotherapy treatment.        06/25/2016 - 08/20/2016 Radiation Therapy    Radiation in [place], [place] by Dr. Quitman Livings.  R lung to 6600 cGy      07/09/2016 - 07/10/2016 Hospital Admission  Admit date: 07/09/2016  Admission diagnosis: Fever and chills Additional comments: Negative work-up, discharged with course of Levaquin      07/09/2016 Treatment Plan Change    Treatment deferred x 1 week due to hospitalization       08/05/2016 - 08/06/2016 Hospital Admission    Admit date: 08/05/2016 Admission diagnosis: Joint pain Additional comments: Treated with steroids with symptomatic improvement      08/25/2016 Imaging    MRI brain- Negative for  intracranial metastasis on this motion degraded examination.  Stable mild-to-moderate chronic small vessel ischemic disease.      09/18/2016 PET scan    1. Partial metabolic response. Right upper lobe 2.6 cm hypermetabolic pulmonary nodule, decreased in size and metabolism. Mildly hypermetabolic right hilar nodal metastasis, decreased in metabolism. Right paratracheal lymph node is no longer hypermetabolic. No new or progressive metastatic disease. 2. Apical left upper lobe 0.9 cm solid pulmonary nodule, slightly decreased in size, with no significant metabolism. The change in size could indicate a neoplastic etiology for this nodule. 3. Additional subcentimeter pulmonary nodules in the left lung are stable and below PET resolution . 4. Additional findings include aortic atherosclerosis, coronary atherosclerosis, moderate emphysema, stable small pericardial effusion/thickening and mild sigmoid diverticulosis.      01/13/2017 Imaging    CT chest with contrast IMPRESSION: 1. No change in size of the hypermetabolic nodular thickening in the RIGHT upper lobe. 2. LEFT upper lobe spiculated nodule is stable. 3. Rounded LEFT lower lobe pulmonary nodule is stable. 4. No evidence of lung cancer progression.        INTERVAL HISTORY:  John [name] 71 y.o. male returns for follow-up and for consideration of next cycle of therapy.   Patient presents today for continued follow-up. He states that overall he has been doing well. He has occasional shortness of breath with exertion. He tried Chantix for smoking cessation however, the test of your nausea vomiting therefore he stopped. He's currently smoking 6 cigarettes a day. In used to complain of fatigue. Appetite is good. He states that the neuropathy is worse and his fingertips more than in his toes. He's noted that he's been dropping things more frequently without realizing it but he button his shirt without any problems. He also currently has  a diabetic foot ulcer on his right foot which he's been seen podiatry. It has not been healing as expected and he may potentially have to have a cast put on the foot to help with wound healing.  REVIEW OF SYSTEMS:  Review of Systems  Constitutional: Positive for fatigue. Negative for chills and fever.  HENT:  Negative.   Eyes: Negative.   Respiratory: Positive for shortness of breath. Negative for cough.   Cardiovascular: Negative.  Negative for chest pain.  Gastrointestinal: Positive for constipation and nausea. Negative for abdominal pain, blood in stool, diarrhea and vomiting.  Endocrine: Negative.   Genitourinary: Negative.  Negative for dysuria and hematuria.   Musculoskeletal: Negative.   Skin: Positive for wound.  Neurological: Positive for numbness. Negative for dizziness and headaches.  Hematological: Negative.   Psychiatric/Behavioral: Negative.      PAST MEDICAL/SURGICAL HISTORY:  Past Medical History:  Diagnosis Date  . Adenocarcinoma of lung, right ([place]) 04/18/2016  . Anxiety   . Arthritis   . CHF (congestive heart failure) ([place])   . COPD (chronic obstructive pulmonary disease) ([place])   . Depression   . Diabetes mellitus without complication ([facility])    no meds  . DM (diabetes mellitus) ([place])  07/09/2016  . Dyspnea   . History of kidney stones   . Hyperlipidemia   . Hypertension   . Macular degeneration   . Neuropathy   . Non-small cell lung cancer, right ([place]) 04/18/2016  . Prostatitis   . Pulmonary nodule, left 07/16/2016  . Sleep apnea    cpap   Past Surgical History:  Procedure Laterality Date  . CATARACT EXTRACTION, BILATERAL Bilateral   . NO PAST SURGERIES    . PORTACATH PLACEMENT Left 06/13/2016   Procedure: INSERTION PORT-A-CATH;  Surgeon: Aviva Signs, MD;  Location: [facility];  Service: General;  Laterality: Left;  Marland Kitchen VIDEO BRONCHOSCOPY WITH ENDOBRONCHIAL NAVIGATION N/A 05/28/2016   Procedure: VIDEO BRONCHOSCOPY WITH ENDOBRONCHIAL NAVIGATION;  Surgeon: [name], MD;  Location: [facility];  Service: Thoracic;  Laterality: N/A;  . VIDEO BRONCHOSCOPY WITH ENDOBRONCHIAL ULTRASOUND N/A 05/28/2016   Procedure: VIDEO BRONCHOSCOPY WITH ENDOBRONCHIAL ULTRASOUND;  Surgeon: [name], MD;  Location: [facility];  Service: Thoracic;  Laterality: N/A;     SOCIAL HISTORY:  Social History   Social History  . Marital status: Divorced    Spouse name: N/A  . Number of children: N/A  . Years of education: N/A   Occupational History  . Not on file.   Social History Main Topics  . Smoking status: Current Every Day Smoker    Packs/day: 0.50    Years: 55.00    Types: Cigarettes    Start date: 03/11/1961  . Smokeless tobacco: Never Used  . Alcohol use No  . Drug use: No  . Sexual activity: Yes    Birth control/ protection: None   Other Topics Concern  . Not on file   Social History Narrative  . No narrative on file    FAMILY HISTORY:  Family History  Problem Relation Age of Onset  . Hypertension Mother   . Diabetes Father   . Heart disease Father   . Hypertension Sister     CURRENT MEDICATIONS:  Outpatient Encounter Prescriptions as of 01/23/2017  Medication Sig Note  . acetaminophen (TYLENOL) 325 MG tablet Take 2 tablets (650 mg total) by mouth every 6 (six) hours as needed for mild pain (or Fever >/= 101).   Marland Kitchen albuterol (PROVENTIL HFA;VENTOLIN HFA) 108 (90 Base) MCG/ACT inhaler Inhale 2 puffs into the lungs every 6 (six) hours as needed for wheezing or shortness of breath.   Marland Kitchen aspirin EC 81 MG tablet Take 81 mg by mouth daily.   . cloNIDine (CATAPRES) 0.1 MG tablet TAKE 1 TABLET (0.1 MG TOTAL) BY MOUTH 3 (THREE) TIMES DAILY. 12/05/2016: Taking 2 times a day.  [place] Oris (IMFINZI IV) Inject into the vein. Every 2 weeks   . finasteride (PROSCAR) 5 MG tablet TAKE 1 TABLET EVERY DAY   . fluticasone (FLONASE) 50 MCG/ACT nasal spray Place 2 sprays into both nostrils daily. 12/05/2016: As needed  . Fluticasone-Salmeterol (ADVAIR  DISKUS IN) Inhale 1 puff into the lungs 2 (two) times daily. 12/05/2016: As needed  . gabapentin (NEURONTIN) 300 MG capsule TAKE 1 CAPSULE BY MOUTH THREE TIMES A DAY   . HYDROcodone-acetaminophen (NORCO/VICODIN) 5-325 MG tablet Take 2 tablets by mouth every 4 (four) hours as needed. 12/05/2016: As needed  . ibuprofen (ADVIL,MOTRIN) 600 MG tablet Take 1 tablet (600 mg total) by mouth every 6 (six) hours as needed.   . INCRUSE ELLIPTA 62.5 MCG/INH AEPB TAKE 1 PUFF BY MOUTH EVERY DAY   . irbesartan-hydrochlorothiazide (AVALIDE) 300-12.5 MG tablet TAKE 1 TABLET BY  MOUTH DAILY.   Marland Kitchen KLOR-CON M20 20 MEQ tablet TAKE 1 TABLET BY MOUTH EVERY DAY   . LORazepam (ATIVAN) 0.5 MG tablet TAKE 1 TABLET BY MOUTH TWICE A DAY AS NEEDED   . magic mouthwash w/lidocaine SOLN USE 1 TEASPOONFUL BY 15MINS BEFORE MEALS AND AS NEEDED FOR [place] AND SWALLOW 12/05/2016: As needed  . omeprazole (PRILOSEC) 20 MG capsule Take 20 mg by mouth daily.    . ondansetron (ZOFRAN) 8 MG tablet Take 1 tablet (8 mg total) by mouth every 8 (eight) hours as needed for nausea or vomiting.   . pravastatin (PRAVACHOL) 40 MG tablet Take 40 mg by mouth every 3 (three) days. M-W-F   . prochlorperazine (COMPAZINE) 10 MG tablet Take 1 tablet (10 mg total) by mouth every 6 (six) hours as needed for nausea or vomiting.   . tamsulosin (FLOMAX) 0.4 MG CAPS capsule TAKE 2 CAPSULES (0.8 MG TOTAL) BY MOUTH DAILY.   Marland Kitchen TRADJENTA 5 MG TABS tablet TAKE 1 TABLET (5 MG TOTAL) BY MOUTH DAILY.   . traZODone (DESYREL) 50 MG tablet Take 1 tablet (50 mg total) by mouth at bedtime as needed for sleep.   . varenicline (CHANTIX STARTING MONTH PAK) 0.5 MG X 11 & 1 MG X 42 tablet Take one 0.5 mg tablet by mouth once daily for 3 days, then increase to one 0.5 mg tablet twice daily for 4 days, then increase to one 1 mg tablet twice daily.    No facility-administered encounter medications on file as of 01/23/2017.     ALLERGIES:  No Known Allergies   PHYSICAL  EXAM:  ECOG Performance status: 1 - Symptomatic, but independent.      Physical Exam  Constitutional: He is oriented to person, place, and time and well-developed, well-nourished, and in no distress.  Seen in chemo chair in infusion area   HENT:  Head: Normocephalic.  Mouth/Throat: Oropharynx is clear and moist.  Eyes: Conjunctivae are normal. No scleral icterus.  Neck: Normal range of motion. Neck supple.  Cardiovascular: Normal rate and regular rhythm.   Pulmonary/Chest: Effort normal. No respiratory distress. He has no wheezes.  Diminished breath sounds to bilat bases  Abdominal: Soft. Bowel sounds are normal. There is no tenderness.  Musculoskeletal: Normal range of motion. He exhibits no edema.  (R) foot in walking boot  Lymphadenopathy:    He has no cervical adenopathy.       Right: No supraclavicular adenopathy present.       Left: No supraclavicular adenopathy present.  Neurological: He is alert and oriented to person, place, and time. No cranial nerve deficit.  Skin: Skin is warm and dry. No rash noted.  Psychiatric: Mood, memory, affect and judgment normal.  Nursing note and vitals reviewed.    LABORATORY DATA:  I have reviewed the labs as listed.  CBC    Component Value Date/Time   WBC 6.4 01/05/2017 1317   RBC 4.02 (L) 01/05/2017 1317   HGB 13.3 01/05/2017 1317   HGB 9.6 (L) 08/28/2016 1117   HCT 38.8 (L) 01/05/2017 1317   HCT 27.7 (L) 08/28/2016 1117   PLT 330 01/05/2017 1317   PLT 268 08/28/2016 1117   MCV 96.5 01/05/2017 1317   MCV 96 08/28/2016 1117   MCH 33.1 01/05/2017 1317   MCHC 34.3 01/05/2017 1317   RDW 12.6 01/05/2017 1317   RDW 19.4 (H) 08/28/2016 1117   LYMPHSABS 0.7 01/05/2017 1317   LYMPHSABS 0.6 (L) 08/28/2016 1117   MONOABS 0.6 01/05/2017  1317   EOSABS 0.1 01/05/2017 1317   EOSABS 0.0 08/28/2016 1117   BASOSABS 0.0 01/05/2017 1317   BASOSABS 0.0 08/28/2016 1117   CMP Latest Ref Rng & Units 01/05/2017 12/22/2016 12/05/2016  Glucose  65 - 99 mg/dL 121(H) 148(H) 144(H)  BUN 6 - 20 mg/dL 18 15 24(H)  Creatinine 0.61 - 1.24 mg/dL 0.77 0.71 0.77  Sodium 135 - 145 mmol/L 137 137 137  Potassium 3.5 - 5.1 mmol/L 3.8 3.6 3.7  Chloride 101 - 111 mmol/L 99(L) 99(L) 101  CO2 22 - 32 mmol/L 28 29 27   Calcium 8.9 - 10.3 mg/dL 9.4 9.6 9.2  Total Protein 6.5 - 8.1 g/dL 7.1 7.1 7.2  Total Bilirubin 0.3 - 1.2 mg/dL 0.4 0.3 0.4  Alkaline Phos 38 - 126 U/L 80 80 76  AST 15 - 41 U/L 15 17 18   ALT 17 - 63 U/L 14(L) 15(L) 13(L)     PENDING LABS:    DIAGNOSTIC IMAGING:  PET scan: 09/18/16 CLINICAL DATA:  Subsequent treatment strategy for stage IIB (T1b N1 M0) right upper lobe non-small cell lung carcinoma status post concurrent chemoradiation therapy completed 08/20/2016.  EXAM: NUCLEAR MEDICINE PET SKULL BASE TO THIGH  TECHNIQUE: 8.5 mCi F-18 FDG was injected intravenously. Full-ring PET imaging was performed from the skull base to thigh after the radiotracer. CT data was obtained and used for attenuation correction and anatomic localization.  FASTING BLOOD GLUCOSE:  Value: 151 mg/dl  COMPARISON:  04/24/2016 PET-CT.  FINDINGS: NECK  No hypermetabolic lymph nodes in the neck.  CHEST  Hypermetabolic irregular right upper lobe 2.6 x 2.2 cm pulmonary nodule with max SUV 9.4 (series 7/image 16), previously 3.5 x 2.7 cm with max SUV 18.0, decreased in size and metabolism.  Right hilar node with borderline mild hypermetabolism (max SUV 3.2), decreased in metabolism (previous max SUV 4.4).  No additional enlarged or hypermetabolic hilar, mediastinal or axillary nodes. Previously described mildly hypermetabolic right paratracheal node is no longer hypermetabolic.  Apical left upper lobe 0.9 cm solid pulmonary nodule (series 7/image 9) does not demonstrate significant metabolism (max SUV 0.8), slightly decreased from 1.1 cm.  Left upper lobe 4 mm (series 7/image 23) and left lower lobe 6 mm (series 7/image  38) solid pulmonary nodules are stable, below PET resolution and not associated with significant metabolism. No acute consolidative airspace disease or new significant pulmonary nodules.  Moderate centrilobular emphysema with diffuse bronchial wall thickening.  Left subclavian MediPort terminates in the middle third of the superior vena cava. Atherosclerotic nonaneurysmal thoracic aorta. Coronary atherosclerosis. Stable small pericardial effusion/thickening. No pneumothorax. No pleural effusions.  ABDOMEN/PELVIS  No abnormal hypermetabolic activity within the liver, pancreas, adrenal glands, or spleen. No hypermetabolic lymph nodes in the abdomen or pelvis. Scattered granulomatous calcifications in the liver are unchanged. Partially exophytic simple 3.7 cm posterior interpolar right renal cyst. Atherosclerotic nonaneurysmal abdominal aorta. Mild sigmoid diverticulosis. Mildly enlarged prostate.  SKELETON  No focal hypermetabolic activity to suggest skeletal metastasis.  IMPRESSION: 1. Partial metabolic response. Right upper lobe 2.6 cm hypermetabolic pulmonary nodule, decreased in size and metabolism. Mildly hypermetabolic right hilar nodal metastasis, decreased in metabolism. Right paratracheal lymph node is no longer hypermetabolic. No new or progressive metastatic disease. 2. Apical left upper lobe 0.9 cm solid pulmonary nodule, slightly decreased in size, with no significant metabolism. The change in size could indicate a neoplastic etiology for this nodule. 3. Additional subcentimeter pulmonary nodules in the left lung are stable and below PET resolution . 4.  Additional findings include aortic atherosclerosis, coronary atherosclerosis, moderate emphysema, stable small pericardial effusion/thickening and mild sigmoid diverticulosis.   Electronically Signed   By: Ilona Sorrel M.D.   On: 09/18/2016 10:21     PATHOLOGY:  [place]: 05/28/16     ASSESSMENT &  PLAN:   Stage IIB/IIIA NSCLC (T1b, N1-2, M0) right lung:  -s/p concurrent chemoradiation x 6 cycles of weekly Carbo/Taxol; which he completed on 08/15/16.  Restaging PET scan in 09/2016 showed partial metabolic response to treatment and consolidative/maintenance therapy with Imfinzi started on 09/26/16.   -Due for cycle #9  Imfinzi today; labs reviewed and are adequate for treatment. We have been monitoring TSH for immune-mediate thyroid dysfunction, which has been normal thus far.   -Continue Imfinzi every 2 weeks as scheduled.  -Reviewed his restaging scan with him in detail. He has no evidence of progression.  Tobacco use disorder:  -Patient did not tolerate chantix. He had severe nausea/vomiting. He has cut down on the amount of cigarettes he is smoking daily.  Diabetic Neuropathy: -Advised him he can increase his gabapentin to QID.   Dispo:  -Continue Imfinzi every 2 weeks.  -Return to cancer center 8 weeks for follow-up visit with subsequent treatment.    All questions were answered to patient's stated satisfaction. Encouraged patient to call with any new concerns or questions before his next visit to the cancer center and we can certain see him sooner, if needed.    Twana First, MD

## 2017-01-23 NOTE — Progress Notes (Signed)
Nutrition Assessment   Reason for Assessment:   Patient identified on Malnutrition Screening Report for poor appetite and weight loss  ASSESSMENT:  71 year old male with lung cancer currently on imfinzi. Patient completed chemoradiation on 08/15/16.  Past medical history of CHF, COPD, depression, DM, HLD.    Met with patient during infusion today.  Patient reports appetite is about 75%.  Reports that he typically eats a late breakfast, then may have a boost glucose control shake and good supper meal of meat, vegetables and starch.  Reports wife is vegetarian but she cooks meat for him to eat.  Patient reports nausea, vomiting and constipation with taking chantix and has stopped taking it.  Reports had some nausea yesterday and took medication and it helped.    Nutrition Focused Physical Exam: deferred  Medications: zofran, prilosec, compazine  Labs: glucose 127  Anthropometrics:   Height: 66 inches Weight: 175 lb UBW: 190 lb (March 2018) BMI: 28  8% weight loss in the last 7 months, not significant  Patient pleased with weight loss   Estimated Energy Needs  Kcals: 2000-2400 calories/d Protein: 100-120 g/d Fluid: > 2 L/d  NUTRITION DIAGNOSIS: Inadequate oral intake related to cancer and cancer related treatments as evidenced by 8% weight loss in 7 months   MALNUTRITION DIAGNOSIS: none at this time   INTERVENTION:   Encouraged use of oral nutrition supplements.  Coupons and samples of glucerna given today. Encouraged good sources of protein at every meal and discussed examples. Discussed good blood sugar control and carb counting.   Contact information provided    MONITORING, EVALUATION, GOAL: weight trends, intake, blood glucose   NEXT VISIT: during infusion, Nov 16th  Lillieann Pavlich B. Zenia Resides, [place], [place] Registered Dietitian [phone] (pager)

## 2017-01-23 NOTE — Patient Instructions (Signed)
[facility] Discharge Instructions for Patients Receiving Chemotherapy  Today you received the following chemotherapy agents imfinzi.   To help prevent nausea and vomiting after your treatment, we encourage you to take your nausea medication.    If you develop nausea and vomiting that is not controlled by your nausea medication, call the clinic.   BELOW ARE SYMPTOMS THAT SHOULD BE REPORTED IMMEDIATELY:  *FEVER GREATER THAN 100.5 F  *CHILLS WITH OR WITHOUT FEVER  NAUSEA AND VOMITING THAT IS NOT CONTROLLED WITH YOUR NAUSEA MEDICATION  *UNUSUAL SHORTNESS OF BREATH  *UNUSUAL BRUISING OR BLEEDING  TENDERNESS IN MOUTH AND THROAT WITH OR WITHOUT PRESENCE OF ULCERS  *URINARY PROBLEMS  *BOWEL PROBLEMS  UNUSUAL RASH Items with * indicate a potential emergency and should be followed up as soon as possible.  Feel free to call the clinic should you have any questions or concerns. The clinic phone number is [phone].  Please show the [name] at check-in to the Emergency Department and triage nurse.

## 2017-01-23 NOTE — Patient Instructions (Signed)
[facility] Discharge Instructions  RECOMMENDATIONS MADE BY THE CONSULTANT AND ANY TEST RESULTS WILL BE SENT TO YOUR REFERRING PHYSICIAN.  You saw Dr. Zhou today.  Thank you for choosing [facility] at [facility] to provide your oncology and hematology care.  To afford each patient quality time with our provider, please arrive at least 15 minutes before your scheduled appointment time.    If you have a lab appointment with the Cancer Center please come in thru the  Main Entrance and check in at the main information desk  You need to re-schedule your appointment should you arrive 10 or more minutes late.  We strive to give you quality time with our providers, and arriving late affects you and other patients whose appointments are after yours.  Also, if you no show three or more times for appointments you may be dismissed from the clinic at the providers discretion.     Again, thank you for choosing [facility].  Our hope is that these requests will decrease the amount of time that you wait before being seen by our physicians.       _____________________________________________________________  Should you have questions after your visit to [facility], please contact our office at [phone] between the hours of 8:30 a.m. and 4:30 p.m.  Voicemails left after 4:30 p.m. will not be returned until the following business day.  For prescription refill requests, have your pharmacy contact our office.       Resources For Cancer Patients and their Caregivers ? American Cancer Society: Can assist with transportation, wigs, general needs, runs Look Good Feel Better.        1-888-227-6333 ? Cancer Care: Provides financial assistance, online support groups, medication/co-pay assistance.  1-800-813-HOPE (4673) ? [facility] Assists [place] cancer patients and their families through  emotional , educational and financial support.  336-427-4357 ? [place] DSS Where to apply for food stamps, Medicaid and utility assistance. 336-342-1394 ? [facility]: Transportation to medical appointments. 336-347-2287 ? Social Security Administration: May apply for disability if have a Stage IV cancer. 336-342-7796 1-800-772-1213 ? [place] Aging, Disability and Transit Services: Assists with nutrition, care and transit needs. 336-349-2343  Cancer Center Support Programs: [email or web address]@ > Cancer Support Group  2nd Tuesday of the month 1pm-2pm, Journey Room  > Creative Journey  3rd Tuesday of the month 1130am-1pm, Journey Room  > Look Good Feel Better  1st Wednesday of the month 10am-12 noon, Journey Room (Call American Cancer Society to register 1-800-395-5775)    

## 2017-01-23 NOTE — Progress Notes (Signed)
Labs reviewed with DR. Zhou and ok to treat today.   Patient tolerated chemotherapy with no complaints voiced.  Port site clean and dry with no bruising or swelling noted at site.  Band aid applied.  Instructed to call for any questions or concerns with understanding verbalized.  VSS with discharge and left ambulatory with no s/s of distress.

## 2017-01-25 ENCOUNTER — Other Ambulatory Visit: Payer: Self-pay | Admitting: Family Medicine

## 2017-01-29 DIAGNOSIS — F17210 Nicotine dependence, cigarettes, uncomplicated: Secondary | ICD-10-CM | POA: Diagnosis not present

## 2017-01-29 DIAGNOSIS — C3411 Malignant neoplasm of upper lobe, right bronchus or lung: Secondary | ICD-10-CM | POA: Diagnosis not present

## 2017-01-29 DIAGNOSIS — J449 Chronic obstructive pulmonary disease, unspecified: Secondary | ICD-10-CM | POA: Diagnosis not present

## 2017-01-29 DIAGNOSIS — E11621 Type 2 diabetes mellitus with foot ulcer: Secondary | ICD-10-CM | POA: Diagnosis not present

## 2017-01-29 DIAGNOSIS — E1142 Type 2 diabetes mellitus with diabetic polyneuropathy: Secondary | ICD-10-CM | POA: Diagnosis not present

## 2017-01-29 DIAGNOSIS — L97512 Non-pressure chronic ulcer of other part of right foot with fat layer exposed: Secondary | ICD-10-CM | POA: Diagnosis not present

## 2017-02-05 ENCOUNTER — Encounter (HOSPITAL_BASED_OUTPATIENT_CLINIC_OR_DEPARTMENT_OTHER): Payer: Medicare Other | Attending: Internal Medicine

## 2017-02-05 DIAGNOSIS — I509 Heart failure, unspecified: Secondary | ICD-10-CM | POA: Diagnosis not present

## 2017-02-05 DIAGNOSIS — E1140 Type 2 diabetes mellitus with diabetic neuropathy, unspecified: Secondary | ICD-10-CM | POA: Diagnosis not present

## 2017-02-05 DIAGNOSIS — C3490 Malignant neoplasm of unspecified part of unspecified bronchus or lung: Secondary | ICD-10-CM | POA: Diagnosis not present

## 2017-02-05 DIAGNOSIS — Z79899 Other long term (current) drug therapy: Secondary | ICD-10-CM | POA: Insufficient documentation

## 2017-02-05 DIAGNOSIS — E1142 Type 2 diabetes mellitus with diabetic polyneuropathy: Secondary | ICD-10-CM | POA: Diagnosis not present

## 2017-02-05 DIAGNOSIS — J449 Chronic obstructive pulmonary disease, unspecified: Secondary | ICD-10-CM | POA: Diagnosis not present

## 2017-02-05 DIAGNOSIS — Z923 Personal history of irradiation: Secondary | ICD-10-CM | POA: Insufficient documentation

## 2017-02-05 DIAGNOSIS — G4730 Sleep apnea, unspecified: Secondary | ICD-10-CM | POA: Insufficient documentation

## 2017-02-05 DIAGNOSIS — E11621 Type 2 diabetes mellitus with foot ulcer: Secondary | ICD-10-CM | POA: Insufficient documentation

## 2017-02-05 DIAGNOSIS — F17210 Nicotine dependence, cigarettes, uncomplicated: Secondary | ICD-10-CM | POA: Diagnosis not present

## 2017-02-05 DIAGNOSIS — L97511 Non-pressure chronic ulcer of other part of right foot limited to breakdown of skin: Secondary | ICD-10-CM | POA: Diagnosis not present

## 2017-02-05 DIAGNOSIS — I110 Hypertensive heart disease with heart failure: Secondary | ICD-10-CM | POA: Insufficient documentation

## 2017-02-06 ENCOUNTER — Encounter (HOSPITAL_COMMUNITY): Payer: Medicare Other | Attending: Oncology

## 2017-02-06 ENCOUNTER — Other Ambulatory Visit: Payer: Self-pay | Admitting: Nurse Practitioner

## 2017-02-06 ENCOUNTER — Encounter (HOSPITAL_COMMUNITY): Payer: Self-pay

## 2017-02-06 VITALS — BP 116/63 | HR 106 | Temp 98.6°F | Resp 16 | Wt 176.8 lb

## 2017-02-06 DIAGNOSIS — R300 Dysuria: Secondary | ICD-10-CM | POA: Insufficient documentation

## 2017-02-06 DIAGNOSIS — C3491 Malignant neoplasm of unspecified part of right bronchus or lung: Secondary | ICD-10-CM | POA: Diagnosis not present

## 2017-02-06 DIAGNOSIS — R739 Hyperglycemia, unspecified: Secondary | ICD-10-CM | POA: Insufficient documentation

## 2017-02-06 DIAGNOSIS — Z5112 Encounter for antineoplastic immunotherapy: Secondary | ICD-10-CM

## 2017-02-06 DIAGNOSIS — R0602 Shortness of breath: Secondary | ICD-10-CM | POA: Diagnosis not present

## 2017-02-06 DIAGNOSIS — Z5111 Encounter for antineoplastic chemotherapy: Secondary | ICD-10-CM | POA: Insufficient documentation

## 2017-02-06 DIAGNOSIS — Z23 Encounter for immunization: Secondary | ICD-10-CM

## 2017-02-06 LAB — COMPREHENSIVE METABOLIC PANEL
ALK PHOS: 84 U/L (ref 38–126)
ALT: 13 U/L — ABNORMAL LOW (ref 17–63)
AST: 17 U/L (ref 15–41)
Albumin: 3.6 g/dL (ref 3.5–5.0)
Anion gap: 9 (ref 5–15)
BILIRUBIN TOTAL: 0.3 mg/dL (ref 0.3–1.2)
BUN: 20 mg/dL (ref 6–20)
CALCIUM: 9 mg/dL (ref 8.9–10.3)
CO2: 28 mmol/L (ref 22–32)
Chloride: 99 mmol/L — ABNORMAL LOW (ref 101–111)
Creatinine, Ser: 0.84 mg/dL (ref 0.61–1.24)
GLUCOSE: 140 mg/dL — AB (ref 65–99)
Potassium: 3.7 mmol/L (ref 3.5–5.1)
Sodium: 136 mmol/L (ref 135–145)
TOTAL PROTEIN: 7.1 g/dL (ref 6.5–8.1)

## 2017-02-06 LAB — CBC WITH DIFFERENTIAL/PLATELET
BASOS ABS: 0 10*3/uL (ref 0.0–0.1)
BASOS PCT: 0 %
EOS PCT: 3 %
Eosinophils Absolute: 0.2 10*3/uL (ref 0.0–0.7)
HCT: 37.2 % — ABNORMAL LOW (ref 39.0–52.0)
Hemoglobin: 12.8 g/dL — ABNORMAL LOW (ref 13.0–17.0)
Lymphocytes Relative: 10 %
Lymphs Abs: 0.9 10*3/uL (ref 0.7–4.0)
MCH: 33.6 pg (ref 26.0–34.0)
MCHC: 34.4 g/dL (ref 30.0–36.0)
MCV: 97.6 fL (ref 78.0–100.0)
MONO ABS: 0.9 10*3/uL (ref 0.1–1.0)
Monocytes Relative: 10 %
NEUTROS ABS: 6.6 10*3/uL (ref 1.7–7.7)
Neutrophils Relative %: 77 %
Platelets: 335 10*3/uL (ref 150–400)
RBC: 3.81 MIL/uL — ABNORMAL LOW (ref 4.22–5.81)
RDW: 14.1 % (ref 11.5–15.5)
WBC: 8.5 10*3/uL (ref 4.0–10.5)

## 2017-02-06 LAB — TSH: TSH: 4.553 u[IU]/mL — AB (ref 0.350–4.500)

## 2017-02-06 MED ORDER — SODIUM CHLORIDE 0.9% FLUSH
10.0000 mL | INTRAVENOUS | Status: DC | PRN
  Administered 2017-02-06: 10 mL
  Filled 2017-02-06: qty 10

## 2017-02-06 MED ORDER — SODIUM CHLORIDE 0.9 % IV SOLN
Freq: Once | INTRAVENOUS | Status: AC
  Administered 2017-02-06: 13:00:00 via INTRAVENOUS

## 2017-02-06 MED ORDER — SODIUM CHLORIDE 0.9 % IV SOLN
9.5000 mg/kg | Freq: Once | INTRAVENOUS | Status: AC
  Administered 2017-02-06: 740 mg via INTRAVENOUS
  Filled 2017-02-06: qty 4.8

## 2017-02-06 MED ORDER — HEPARIN SOD (PORK) LOCK FLUSH 100 UNIT/ML IV SOLN
500.0000 [IU] | Freq: Once | INTRAVENOUS | Status: AC | PRN
  Administered 2017-02-06: 500 [IU]

## 2017-02-06 NOTE — Progress Notes (Signed)
Labs reviewed with MD, proceed with treatment.  Treatment given per orders. Patient tolerated it well without problems. Vitals stable and discharged home from clinic ambulatory. Follow up as scheduled.

## 2017-02-06 NOTE — Telephone Encounter (Signed)
Last seen 6.4.18 MMM

## 2017-02-06 NOTE — Patient Instructions (Signed)
[facility] Discharge Instructions for Patients Receiving Chemotherapy   Beginning January 23rd 2017 lab work for the Cancer Center will be done in the  Main lab at [facility] on 1st floor. If you have a lab appointment with the Cancer Center please come in thru the  Main Entrance and check in at the main information desk   Today you received the following chemotherapy agents   To help prevent nausea and vomiting after your treatment, we encourage you to take your nausea medication     If you develop nausea and vomiting, or diarrhea that is not controlled by your medication, call the clinic.  The clinic phone number is [phone]. Office hours are Monday-Friday 8:30am-5:00pm.  BELOW ARE SYMPTOMS THAT SHOULD BE REPORTED IMMEDIATELY:  *FEVER GREATER THAN 101.0 F  *CHILLS WITH OR WITHOUT FEVER  NAUSEA AND VOMITING THAT IS NOT CONTROLLED WITH YOUR NAUSEA MEDICATION  *UNUSUAL SHORTNESS OF BREATH  *UNUSUAL BRUISING OR BLEEDING  TENDERNESS IN MOUTH AND THROAT WITH OR WITHOUT PRESENCE OF ULCERS  *URINARY PROBLEMS  *BOWEL PROBLEMS  UNUSUAL RASH Items with * indicate a potential emergency and should be followed up as soon as possible. If you have an emergency after office hours please contact your primary care physician or go to the nearest emergency department.  Please call the clinic during office hours if you have any questions or concerns.   You may also contact the Patient Navigator at [phone] should you have any questions or need assistance in obtaining follow up care.      Resources For Cancer Patients and their Caregivers ? American Cancer Society: Can assist with transportation, wigs, general needs, runs Look Good Feel Better.        1-888-227-6333 ? Cancer Care: Provides financial assistance, online support groups, medication/co-pay assistance.  1-800-813-HOPE (4673) ? [facility] Assists [place] cancer  patients and their families through emotional , educational and financial support.  336-427-4357 ? [place] DSS Where to apply for food stamps, Medicaid and utility assistance. 336-342-1394 ? RCATS: Transportation to medical appointments. 336-347-2287 ? Social Security Administration: May apply for disability if have a Stage IV cancer. 336-342-7796 1-800-772-1213 ? [place] Aging, Disability and Transit Services: Assists with nutrition, care and transit needs. 336-349-2343         

## 2017-02-10 ENCOUNTER — Other Ambulatory Visit (HOSPITAL_COMMUNITY): Payer: Self-pay | Admitting: Adult Health

## 2017-02-10 DIAGNOSIS — R7989 Other specified abnormal findings of blood chemistry: Secondary | ICD-10-CM

## 2017-02-11 ENCOUNTER — Other Ambulatory Visit: Payer: Self-pay | Admitting: Family Medicine

## 2017-02-11 ENCOUNTER — Other Ambulatory Visit: Payer: Self-pay | Admitting: Nurse Practitioner

## 2017-02-13 ENCOUNTER — Ambulatory Visit (HOSPITAL_COMMUNITY): Payer: Medicare Other

## 2017-02-13 ENCOUNTER — Other Ambulatory Visit: Payer: Self-pay | Admitting: Nurse Practitioner

## 2017-02-19 ENCOUNTER — Other Ambulatory Visit (HOSPITAL_COMMUNITY)
Admission: RE | Admit: 2017-02-19 | Discharge: 2017-02-19 | Disposition: A | Discharge status: Home / Self Care | Payer: Medicare Other | Source: Other Acute Inpatient Hospital | Attending: Internal Medicine | Admitting: Internal Medicine

## 2017-02-19 DIAGNOSIS — S91101A Unspecified open wound of right great toe without damage to nail, initial encounter: Secondary | ICD-10-CM | POA: Diagnosis not present

## 2017-02-19 DIAGNOSIS — E1142 Type 2 diabetes mellitus with diabetic polyneuropathy: Secondary | ICD-10-CM | POA: Diagnosis not present

## 2017-02-19 DIAGNOSIS — X58XXXA Exposure to other specified factors, initial encounter: Secondary | ICD-10-CM | POA: Diagnosis not present

## 2017-02-19 DIAGNOSIS — J449 Chronic obstructive pulmonary disease, unspecified: Secondary | ICD-10-CM | POA: Diagnosis not present

## 2017-02-19 DIAGNOSIS — F17210 Nicotine dependence, cigarettes, uncomplicated: Secondary | ICD-10-CM | POA: Diagnosis not present

## 2017-02-19 DIAGNOSIS — G4730 Sleep apnea, unspecified: Secondary | ICD-10-CM | POA: Diagnosis not present

## 2017-02-19 DIAGNOSIS — E11621 Type 2 diabetes mellitus with foot ulcer: Secondary | ICD-10-CM | POA: Diagnosis not present

## 2017-02-19 DIAGNOSIS — I110 Hypertensive heart disease with heart failure: Secondary | ICD-10-CM | POA: Diagnosis not present

## 2017-02-19 DIAGNOSIS — L97512 Non-pressure chronic ulcer of other part of right foot with fat layer exposed: Secondary | ICD-10-CM | POA: Diagnosis not present

## 2017-02-20 ENCOUNTER — Ambulatory Visit (HOSPITAL_COMMUNITY)
Admission: RE | Admit: 2017-02-20 | Discharge: 2017-02-20 | Disposition: A | Discharge status: Home / Self Care | Payer: Medicare Other | Source: Ambulatory Visit | Attending: Oncology | Admitting: Oncology

## 2017-02-20 ENCOUNTER — Encounter (HOSPITAL_COMMUNITY): Payer: Medicare Other

## 2017-02-20 ENCOUNTER — Other Ambulatory Visit: Payer: Self-pay

## 2017-02-20 ENCOUNTER — Encounter (HOSPITAL_COMMUNITY): Payer: Self-pay

## 2017-02-20 ENCOUNTER — Encounter (HOSPITAL_BASED_OUTPATIENT_CLINIC_OR_DEPARTMENT_OTHER): Payer: Medicare Other

## 2017-02-20 VITALS — BP 131/57 | HR 105 | Temp 98.0°F | Resp 20 | Wt 179.0 lb

## 2017-02-20 DIAGNOSIS — R911 Solitary pulmonary nodule: Secondary | ICD-10-CM | POA: Insufficient documentation

## 2017-02-20 DIAGNOSIS — R0602 Shortness of breath: Secondary | ICD-10-CM | POA: Diagnosis not present

## 2017-02-20 DIAGNOSIS — Z5112 Encounter for antineoplastic immunotherapy: Secondary | ICD-10-CM

## 2017-02-20 DIAGNOSIS — R739 Hyperglycemia, unspecified: Secondary | ICD-10-CM | POA: Diagnosis not present

## 2017-02-20 DIAGNOSIS — R7989 Other specified abnormal findings of blood chemistry: Secondary | ICD-10-CM

## 2017-02-20 DIAGNOSIS — C3491 Malignant neoplasm of unspecified part of right bronchus or lung: Secondary | ICD-10-CM

## 2017-02-20 DIAGNOSIS — Z5111 Encounter for antineoplastic chemotherapy: Secondary | ICD-10-CM | POA: Diagnosis not present

## 2017-02-20 DIAGNOSIS — R05 Cough: Secondary | ICD-10-CM | POA: Diagnosis not present

## 2017-02-20 DIAGNOSIS — R300 Dysuria: Secondary | ICD-10-CM | POA: Diagnosis not present

## 2017-02-20 LAB — COMPREHENSIVE METABOLIC PANEL
ALBUMIN: 3.5 g/dL (ref 3.5–5.0)
ALT: 12 U/L — AB (ref 17–63)
AST: 17 U/L (ref 15–41)
Alkaline Phosphatase: 87 U/L (ref 38–126)
Anion gap: 9 (ref 5–15)
BILIRUBIN TOTAL: 0.6 mg/dL (ref 0.3–1.2)
BUN: 16 mg/dL (ref 6–20)
CHLORIDE: 99 mmol/L — AB (ref 101–111)
CO2: 28 mmol/L (ref 22–32)
CREATININE: 0.82 mg/dL (ref 0.61–1.24)
Calcium: 9.1 mg/dL (ref 8.9–10.3)
GFR calc Af Amer: >60 mL/min (ref >60)
GFR calc non Af Amer: >60 mL/min (ref >60)
GLUCOSE: 140 mg/dL — AB (ref 65–99)
POTASSIUM: 3.6 mmol/L (ref 3.5–5.1)
Sodium: 136 mmol/L (ref 135–145)
TOTAL PROTEIN: 7.2 g/dL (ref 6.5–8.1)

## 2017-02-20 LAB — CBC WITH DIFFERENTIAL/PLATELET
Basophils Absolute: 0 10*3/uL (ref 0.0–0.1)
Basophils Relative: 0 %
EOS PCT: 2 %
Eosinophils Absolute: 0.1 10*3/uL (ref 0.0–0.7)
HCT: 36 % — ABNORMAL LOW (ref 39.0–52.0)
Hemoglobin: 12.5 g/dL — ABNORMAL LOW (ref 13.0–17.0)
LYMPHS ABS: 0.8 10*3/uL (ref 0.7–4.0)
LYMPHS PCT: 12 %
MCH: 34 pg (ref 26.0–34.0)
MCHC: 34.7 g/dL (ref 30.0–36.0)
MCV: 97.8 fL (ref 78.0–100.0)
Monocytes Absolute: 0.6 10*3/uL (ref 0.1–1.0)
Monocytes Relative: 9 %
Neutro Abs: 5 10*3/uL (ref 1.7–7.7)
Neutrophils Relative %: 77 %
PLATELETS: 305 10*3/uL (ref 150–400)
RBC: 3.68 MIL/uL — ABNORMAL LOW (ref 4.22–5.81)
RDW: 14.3 % (ref 11.5–15.5)
WBC: 6.6 10*3/uL (ref 4.0–10.5)

## 2017-02-20 LAB — TSH: TSH: 4.538 u[IU]/mL — ABNORMAL HIGH (ref 0.350–4.500)

## 2017-02-20 LAB — T4, FREE: FREE T4: 1.03 ng/dL (ref 0.61–1.12)

## 2017-02-20 MED ORDER — HEPARIN SOD (PORK) LOCK FLUSH 100 UNIT/ML IV SOLN
500.0000 [IU] | Freq: Once | INTRAVENOUS | Status: AC | PRN
  Administered 2017-02-20: 500 [IU]

## 2017-02-20 MED ORDER — SODIUM CHLORIDE 0.9 % IV SOLN
9.5000 mg/kg | Freq: Once | INTRAVENOUS | Status: AC
  Administered 2017-02-20: 740 mg via INTRAVENOUS
  Filled 2017-02-20: qty 10

## 2017-02-20 MED ORDER — SODIUM CHLORIDE 0.9 % IV SOLN
Freq: Once | INTRAVENOUS | Status: AC
  Administered 2017-02-20: 14:00:00 via INTRAVENOUS

## 2017-02-20 NOTE — Progress Notes (Signed)
Nutrition Follow-up:  Patient with lung cancer currently on imfinzi.    Met with patient during infusion today.  Patient reports appetite is good, "too good."  Reports that he is being treated with antibiotics for DM foot ulcer. Reports that he has been drinking glucerna shakes maybe 1-2 per day.   Patient reports that he has not been checking blood glucose on regular basis lately but recently glucose was in 400 range   No nutrition related side effects reported.    Medications: reviewed  Labs: reviewed  Anthropometrics:   Weight increased today to 179 lb from 176 lb 12.8 oz on 10/19.   NUTRITION DIAGNOSIS: Inadequate oral intake improved   MALNUTRITION DIAGNOSIS: none at this time   INTERVENTION:   Encouraged patient to check blood glucose and keep log to share with MD especially with foot ulcer currently.   Reviewed foods and beverages high in carbohydrate.  Encouraged good sources of protein and reviewed examples of those today.      MONITORING, EVALUATION, GOAL: weight trends, intake, blood glucose   NEXT VISIT: December 21 during infusion  Arienne Gartin B. Zenia Resides, [place], [place] Registered Dietitian [phone] (pager)

## 2017-02-20 NOTE — Progress Notes (Signed)
Tolerated infusion w/o adverse reaction.  Alert, in no distress.  VSS.  Discharged ambulatory.  

## 2017-02-21 LAB — T3: T3, Total: 117 ng/dL (ref 71–180)

## 2017-02-22 LAB — AEROBIC CULTURE  (SUPERFICIAL SPECIMEN)

## 2017-02-22 LAB — AEROBIC CULTURE W GRAM STAIN (SUPERFICIAL SPECIMEN)

## 2017-02-23 DIAGNOSIS — E11621 Type 2 diabetes mellitus with foot ulcer: Secondary | ICD-10-CM | POA: Diagnosis not present

## 2017-02-23 DIAGNOSIS — J449 Chronic obstructive pulmonary disease, unspecified: Secondary | ICD-10-CM | POA: Diagnosis not present

## 2017-02-23 DIAGNOSIS — G4730 Sleep apnea, unspecified: Secondary | ICD-10-CM | POA: Diagnosis not present

## 2017-02-23 DIAGNOSIS — I110 Hypertensive heart disease with heart failure: Secondary | ICD-10-CM | POA: Diagnosis not present

## 2017-02-23 DIAGNOSIS — F17210 Nicotine dependence, cigarettes, uncomplicated: Secondary | ICD-10-CM | POA: Diagnosis not present

## 2017-02-23 DIAGNOSIS — L97512 Non-pressure chronic ulcer of other part of right foot with fat layer exposed: Secondary | ICD-10-CM | POA: Diagnosis not present

## 2017-02-23 DIAGNOSIS — E1142 Type 2 diabetes mellitus with diabetic polyneuropathy: Secondary | ICD-10-CM | POA: Diagnosis not present

## 2017-02-24 ENCOUNTER — Other Ambulatory Visit: Payer: Self-pay | Admitting: Family Medicine

## 2017-02-25 NOTE — Telephone Encounter (Signed)
I do not see on patients medication list

## 2017-03-09 ENCOUNTER — Encounter (HOSPITAL_BASED_OUTPATIENT_CLINIC_OR_DEPARTMENT_OTHER): Payer: Medicare Other | Attending: Internal Medicine

## 2017-03-09 DIAGNOSIS — L84 Corns and callosities: Secondary | ICD-10-CM | POA: Insufficient documentation

## 2017-03-09 DIAGNOSIS — I509 Heart failure, unspecified: Secondary | ICD-10-CM | POA: Insufficient documentation

## 2017-03-09 DIAGNOSIS — E1142 Type 2 diabetes mellitus with diabetic polyneuropathy: Secondary | ICD-10-CM | POA: Diagnosis not present

## 2017-03-09 DIAGNOSIS — G4730 Sleep apnea, unspecified: Secondary | ICD-10-CM | POA: Diagnosis not present

## 2017-03-09 DIAGNOSIS — L97511 Non-pressure chronic ulcer of other part of right foot limited to breakdown of skin: Secondary | ICD-10-CM | POA: Diagnosis not present

## 2017-03-09 DIAGNOSIS — I110 Hypertensive heart disease with heart failure: Secondary | ICD-10-CM | POA: Diagnosis not present

## 2017-03-09 DIAGNOSIS — E1140 Type 2 diabetes mellitus with diabetic neuropathy, unspecified: Secondary | ICD-10-CM | POA: Diagnosis not present

## 2017-03-09 DIAGNOSIS — L97512 Non-pressure chronic ulcer of other part of right foot with fat layer exposed: Secondary | ICD-10-CM | POA: Diagnosis not present

## 2017-03-09 DIAGNOSIS — Z9221 Personal history of antineoplastic chemotherapy: Secondary | ICD-10-CM | POA: Diagnosis not present

## 2017-03-09 DIAGNOSIS — J449 Chronic obstructive pulmonary disease, unspecified: Secondary | ICD-10-CM | POA: Insufficient documentation

## 2017-03-09 DIAGNOSIS — Z923 Personal history of irradiation: Secondary | ICD-10-CM | POA: Insufficient documentation

## 2017-03-09 DIAGNOSIS — E11621 Type 2 diabetes mellitus with foot ulcer: Secondary | ICD-10-CM | POA: Insufficient documentation

## 2017-03-09 DIAGNOSIS — F17210 Nicotine dependence, cigarettes, uncomplicated: Secondary | ICD-10-CM | POA: Diagnosis not present

## 2017-03-10 ENCOUNTER — Other Ambulatory Visit: Payer: Self-pay | Admitting: *Deleted

## 2017-03-10 DIAGNOSIS — H353131 Nonexudative age-related macular degeneration, bilateral, early dry stage: Secondary | ICD-10-CM | POA: Diagnosis not present

## 2017-03-10 DIAGNOSIS — H43812 Vitreous degeneration, left eye: Secondary | ICD-10-CM | POA: Diagnosis not present

## 2017-03-10 DIAGNOSIS — H04123 Dry eye syndrome of bilateral lacrimal glands: Secondary | ICD-10-CM | POA: Diagnosis not present

## 2017-03-10 DIAGNOSIS — Z961 Presence of intraocular lens: Secondary | ICD-10-CM | POA: Diagnosis not present

## 2017-03-10 LAB — HM DIABETES EYE EXAM

## 2017-03-10 MED ORDER — GABAPENTIN 300 MG PO CAPS: ORAL_CAPSULE | ORAL | 3 refills | Status: DC

## 2017-03-12 ENCOUNTER — Other Ambulatory Visit: Payer: Self-pay | Admitting: Family Medicine

## 2017-03-12 ENCOUNTER — Other Ambulatory Visit: Payer: Self-pay | Admitting: Nurse Practitioner

## 2017-03-13 ENCOUNTER — Encounter (HOSPITAL_COMMUNITY): Payer: Self-pay

## 2017-03-13 ENCOUNTER — Encounter (HOSPITAL_COMMUNITY): Payer: Medicare Other | Attending: Oncology

## 2017-03-13 VITALS — BP 134/58 | HR 96 | Temp 98.0°F | Resp 18 | Wt 179.0 lb

## 2017-03-13 DIAGNOSIS — R300 Dysuria: Secondary | ICD-10-CM | POA: Diagnosis not present

## 2017-03-13 DIAGNOSIS — Z5111 Encounter for antineoplastic chemotherapy: Secondary | ICD-10-CM | POA: Insufficient documentation

## 2017-03-13 DIAGNOSIS — R0602 Shortness of breath: Secondary | ICD-10-CM | POA: Insufficient documentation

## 2017-03-13 DIAGNOSIS — Z5112 Encounter for antineoplastic immunotherapy: Secondary | ICD-10-CM

## 2017-03-13 DIAGNOSIS — R739 Hyperglycemia, unspecified: Secondary | ICD-10-CM | POA: Insufficient documentation

## 2017-03-13 DIAGNOSIS — C3491 Malignant neoplasm of unspecified part of right bronchus or lung: Secondary | ICD-10-CM | POA: Insufficient documentation

## 2017-03-13 LAB — CBC WITH DIFFERENTIAL/PLATELET
Basophils Absolute: 0 10*3/uL (ref 0.0–0.1)
Basophils Relative: 0 %
Eosinophils Absolute: 0.2 10*3/uL (ref 0.0–0.7)
Eosinophils Relative: 3 %
HEMATOCRIT: 37 % — AB (ref 39.0–52.0)
HEMOGLOBIN: 12.1 g/dL — AB (ref 13.0–17.0)
LYMPHS ABS: 0.6 10*3/uL — AB (ref 0.7–4.0)
LYMPHS PCT: 9 %
MCH: 32.7 pg (ref 26.0–34.0)
MCHC: 32.7 g/dL (ref 30.0–36.0)
MCV: 100 fL (ref 78.0–100.0)
MONO ABS: 1 10*3/uL (ref 0.1–1.0)
MONOS PCT: 14 %
NEUTROS ABS: 5.6 10*3/uL (ref 1.7–7.7)
Neutrophils Relative %: 74 %
Platelets: 318 10*3/uL (ref 150–400)
RBC: 3.7 MIL/uL — ABNORMAL LOW (ref 4.22–5.81)
RDW: 14.6 % (ref 11.5–15.5)
WBC: 7.5 10*3/uL (ref 4.0–10.5)

## 2017-03-13 LAB — COMPREHENSIVE METABOLIC PANEL
ALBUMIN: 3.6 g/dL (ref 3.5–5.0)
ALT: 14 U/L — ABNORMAL LOW (ref 17–63)
ANION GAP: 7 (ref 5–15)
AST: 17 U/L (ref 15–41)
Alkaline Phosphatase: 93 U/L (ref 38–126)
BUN: 14 mg/dL (ref 6–20)
CALCIUM: 9.2 mg/dL (ref 8.9–10.3)
CHLORIDE: 99 mmol/L — AB (ref 101–111)
CO2: 27 mmol/L (ref 22–32)
Creatinine, Ser: 0.8 mg/dL (ref 0.61–1.24)
GFR calc Af Amer: >60 mL/min (ref >60)
GFR calc non Af Amer: >60 mL/min (ref >60)
GLUCOSE: 141 mg/dL — AB (ref 65–99)
POTASSIUM: 3.7 mmol/L (ref 3.5–5.1)
SODIUM: 133 mmol/L — AB (ref 135–145)
Total Bilirubin: 0.4 mg/dL (ref 0.3–1.2)
Total Protein: 7 g/dL (ref 6.5–8.1)

## 2017-03-13 MED ORDER — SODIUM CHLORIDE 0.9 % IV SOLN
Freq: Once | INTRAVENOUS | Status: AC
  Administered 2017-03-13: 11:00:00 via INTRAVENOUS

## 2017-03-13 MED ORDER — SODIUM CHLORIDE 0.9 % IV SOLN
740.0000 mg | Freq: Once | INTRAVENOUS | Status: AC
  Administered 2017-03-13: 740 mg via INTRAVENOUS
  Filled 2017-03-13: qty 10

## 2017-03-13 MED ORDER — HEPARIN SOD (PORK) LOCK FLUSH 100 UNIT/ML IV SOLN
500.0000 [IU] | Freq: Once | INTRAVENOUS | Status: AC | PRN
  Administered 2017-03-13: 500 [IU]

## 2017-03-13 MED ORDER — SODIUM CHLORIDE 0.9% FLUSH
10.0000 mL | INTRAVENOUS | Status: DC | PRN
  Administered 2017-03-13: 10 mL
  Filled 2017-03-13: qty 10

## 2017-03-13 NOTE — Progress Notes (Signed)
[name] tolerated Imfinzi infusion well without complaints or incident. Labs reviewed with Dr. Talbert Cage prior to administering this medication. VSS upon discharge. Pt discharged self ambulatory in satisfactory condition accompanied by his wife

## 2017-03-13 NOTE — Patient Instructions (Signed)
[facility] Discharge Instructions for Patients Receiving Chemotherapy   Beginning January 23rd 2017 lab work for the [facility] will be done in the  Main lab at [facility] on 1st floor. If you have a lab appointment with the [facility] please come in thru the  Main Entrance and check in at the main information desk   Today you received the following chemotherapy agents Imfinzi.Follow-up as scheduled. Call clinic for any questions or concerns  To help prevent nausea and vomiting after your treatment, we encourage you to take your nausea medication   If you develop nausea and vomiting, or diarrhea that is not controlled by your medication, call the clinic.  The clinic phone number is [phone]. Office hours are Monday-Friday 8:30am-5:00pm.  BELOW ARE SYMPTOMS THAT SHOULD BE REPORTED IMMEDIATELY:  *FEVER GREATER THAN 101.0 F  *CHILLS WITH OR WITHOUT FEVER  NAUSEA AND VOMITING THAT IS NOT CONTROLLED WITH YOUR NAUSEA MEDICATION  *UNUSUAL SHORTNESS OF BREATH  *UNUSUAL BRUISING OR BLEEDING  TENDERNESS IN MOUTH AND THROAT WITH OR WITHOUT PRESENCE OF ULCERS  *URINARY PROBLEMS  *BOWEL PROBLEMS  UNUSUAL RASH Items with * indicate a potential emergency and should be followed up as soon as possible. If you have an emergency after office hours please contact your primary care physician or go to the nearest emergency department.  Please call the clinic during office hours if you have any questions or concerns.   You may also contact the Patient Navigator at [phone] should you have any questions or need assistance in obtaining follow up care.      Resources For Cancer Patients and their Caregivers ? American Cancer Society: Can assist with transportation, wigs, general needs, runs Look Good Feel Better.        856-810-6565 ? Cancer Care: Provides financial assistance, online support groups, medication/co-pay assistance.  1-800-813-HOPE  9397075835) ? [facility] Assists [place] cancer patients and their families through emotional , educational and financial support.  425-690-5315 ? [place] DSS Where to apply for food stamps, Medicaid and utility assistance. 3066472263 ? RCATS: Transportation to medical appointments. 254-336-2239 ? Social Security Administration: May apply for disability if have a Stage IV cancer. ([phone] 818-400-5507 ? [employer], Disability and Transit Services: Assists with nutrition, care and transit needs. 364-463-9499

## 2017-03-14 ENCOUNTER — Other Ambulatory Visit: Payer: Self-pay | Admitting: Family Medicine

## 2017-03-17 NOTE — Telephone Encounter (Signed)
Last follow up appointment 07/2016

## 2017-03-20 ENCOUNTER — Other Ambulatory Visit: Payer: Self-pay | Admitting: *Deleted

## 2017-03-20 MED ORDER — CLONIDINE HCL 0.1 MG PO TABS: 0.1000 mg | ORAL_TABLET | Freq: Three times a day (TID) | ORAL | 11 refills | Status: DC

## 2017-03-23 DIAGNOSIS — L84 Corns and callosities: Secondary | ICD-10-CM | POA: Diagnosis not present

## 2017-03-23 DIAGNOSIS — E11621 Type 2 diabetes mellitus with foot ulcer: Secondary | ICD-10-CM | POA: Diagnosis not present

## 2017-03-23 DIAGNOSIS — F17210 Nicotine dependence, cigarettes, uncomplicated: Secondary | ICD-10-CM | POA: Diagnosis not present

## 2017-03-23 DIAGNOSIS — J449 Chronic obstructive pulmonary disease, unspecified: Secondary | ICD-10-CM | POA: Diagnosis not present

## 2017-03-23 DIAGNOSIS — E1142 Type 2 diabetes mellitus with diabetic polyneuropathy: Secondary | ICD-10-CM | POA: Diagnosis not present

## 2017-03-23 DIAGNOSIS — L97512 Non-pressure chronic ulcer of other part of right foot with fat layer exposed: Secondary | ICD-10-CM | POA: Diagnosis not present

## 2017-03-24 ENCOUNTER — Other Ambulatory Visit: Payer: Self-pay | Admitting: Family Medicine

## 2017-03-27 ENCOUNTER — Encounter (HOSPITAL_COMMUNITY): Payer: Medicare Other

## 2017-03-27 ENCOUNTER — Encounter (HOSPITAL_COMMUNITY): Payer: Self-pay

## 2017-03-27 ENCOUNTER — Encounter (HOSPITAL_BASED_OUTPATIENT_CLINIC_OR_DEPARTMENT_OTHER): Payer: Medicare Other | Admitting: Oncology

## 2017-03-27 ENCOUNTER — Ambulatory Visit (HOSPITAL_COMMUNITY): Payer: Medicare Other

## 2017-03-27 ENCOUNTER — Encounter (HOSPITAL_BASED_OUTPATIENT_CLINIC_OR_DEPARTMENT_OTHER): Payer: Medicare Other

## 2017-03-27 ENCOUNTER — Other Ambulatory Visit: Payer: Self-pay

## 2017-03-27 VITALS — BP 147/71 | HR 93 | Temp 97.1°F | Resp 20 | Wt 180.0 lb

## 2017-03-27 DIAGNOSIS — C3491 Malignant neoplasm of unspecified part of right bronchus or lung: Secondary | ICD-10-CM | POA: Diagnosis not present

## 2017-03-27 DIAGNOSIS — E1140 Type 2 diabetes mellitus with diabetic neuropathy, unspecified: Secondary | ICD-10-CM

## 2017-03-27 DIAGNOSIS — Z923 Personal history of irradiation: Secondary | ICD-10-CM | POA: Diagnosis not present

## 2017-03-27 DIAGNOSIS — Z5111 Encounter for antineoplastic chemotherapy: Secondary | ICD-10-CM | POA: Diagnosis not present

## 2017-03-27 DIAGNOSIS — Z9221 Personal history of antineoplastic chemotherapy: Secondary | ICD-10-CM

## 2017-03-27 DIAGNOSIS — F17218 Nicotine dependence, cigarettes, with other nicotine-induced disorders: Secondary | ICD-10-CM | POA: Diagnosis not present

## 2017-03-27 DIAGNOSIS — R300 Dysuria: Secondary | ICD-10-CM | POA: Diagnosis not present

## 2017-03-27 DIAGNOSIS — R5382 Chronic fatigue, unspecified: Secondary | ICD-10-CM | POA: Diagnosis not present

## 2017-03-27 DIAGNOSIS — R0602 Shortness of breath: Secondary | ICD-10-CM | POA: Diagnosis not present

## 2017-03-27 DIAGNOSIS — Z5112 Encounter for antineoplastic immunotherapy: Secondary | ICD-10-CM

## 2017-03-27 DIAGNOSIS — R739 Hyperglycemia, unspecified: Secondary | ICD-10-CM | POA: Diagnosis not present

## 2017-03-27 LAB — COMPREHENSIVE METABOLIC PANEL
ALK PHOS: 82 U/L (ref 38–126)
ALT: 12 U/L — AB (ref 17–63)
ANION GAP: 11 (ref 5–15)
AST: 18 U/L (ref 15–41)
Albumin: 3.5 g/dL (ref 3.5–5.0)
BILIRUBIN TOTAL: 0.4 mg/dL (ref 0.3–1.2)
BUN: 21 mg/dL — ABNORMAL HIGH (ref 6–20)
CALCIUM: 9.2 mg/dL (ref 8.9–10.3)
CO2: 25 mmol/L (ref 22–32)
CREATININE: 0.87 mg/dL (ref 0.61–1.24)
Chloride: 100 mmol/L — ABNORMAL LOW (ref 101–111)
Glucose, Bld: 154 mg/dL — ABNORMAL HIGH (ref 65–99)
Potassium: 3.8 mmol/L (ref 3.5–5.1)
SODIUM: 136 mmol/L (ref 135–145)
TOTAL PROTEIN: 7.2 g/dL (ref 6.5–8.1)

## 2017-03-27 LAB — CBC WITH DIFFERENTIAL/PLATELET
BASOS ABS: 0 10*3/uL (ref 0.0–0.1)
BASOS PCT: 1 %
EOS ABS: 0.2 10*3/uL (ref 0.0–0.7)
Eosinophils Relative: 3 %
HEMATOCRIT: 37.9 % — AB (ref 39.0–52.0)
HEMOGLOBIN: 12.5 g/dL — AB (ref 13.0–17.0)
Lymphocytes Relative: 8 %
Lymphs Abs: 0.6 10*3/uL — ABNORMAL LOW (ref 0.7–4.0)
MCH: 32.7 pg (ref 26.0–34.0)
MCHC: 33 g/dL (ref 30.0–36.0)
MCV: 99.2 fL (ref 78.0–100.0)
MONOS PCT: 13 %
Monocytes Absolute: 1 10*3/uL (ref 0.1–1.0)
NEUTROS ABS: 5.8 10*3/uL (ref 1.7–7.7)
NEUTROS PCT: 75 %
Platelets: 339 10*3/uL (ref 150–400)
RBC: 3.82 MIL/uL — ABNORMAL LOW (ref 4.22–5.81)
RDW: 14.5 % (ref 11.5–15.5)
WBC: 7.6 10*3/uL (ref 4.0–10.5)

## 2017-03-27 MED ORDER — HEPARIN SOD (PORK) LOCK FLUSH 100 UNIT/ML IV SOLN
500.0000 [IU] | Freq: Once | INTRAVENOUS | Status: AC | PRN
  Administered 2017-03-27: 500 [IU]

## 2017-03-27 MED ORDER — SODIUM CHLORIDE 0.9 % IV SOLN
Freq: Once | INTRAVENOUS | Status: AC
  Administered 2017-03-27: 11:00:00 via INTRAVENOUS

## 2017-03-27 MED ORDER — SODIUM CHLORIDE 0.9 % IV SOLN
740.0000 mg | Freq: Once | INTRAVENOUS | Status: AC
  Administered 2017-03-27: 740 mg via INTRAVENOUS
  Filled 2017-03-27: qty 10

## 2017-03-27 MED ORDER — SODIUM CHLORIDE 0.9% FLUSH
10.0000 mL | INTRAVENOUS | Status: DC | PRN
  Administered 2017-03-27: 10 mL
  Filled 2017-03-27: qty 10

## 2017-03-27 NOTE — Progress Notes (Signed)
Nutrition Follow-up:  Patient with lung cancer currently on infinzi.  Met with patient during infusion today.  Patient reports appetite is good, "too good."  Reports that foot ulcer is healing up. Reports that he has added protein foods and trying to make sure he is eating these at every meal.  Reports that he is checking blood glucose some.    Medications: reviewed  Labs: glucose 154  Anthropometrics:   Weight 180 lb today increased from 179 lb on 11/16   NUTRITION DIAGNOSIS: Inadequate oral intake improved   MALNUTRITION DIAGNOSIS: none at this time   INTERVENTION:   Reviewed carbohydrates, reading food labels and goal for carbohydrates per meal for good blood glucose control.  Handouts given from [facility].   Encouraged patient to switch to no sugar beverage, likes regular Mt Dew.   Encouraged patient to keep good check on blood glucose and eat good sources of protein to help foot ulcer to continue to heal.     MONITORING, EVALUATION, GOAL: weight trends, intake, blood glucose   NEXT VISIT: as needed  Timmie Calix B. Zenia Resides, [place], [place] Registered Dietitian [phone] (pager)

## 2017-03-27 NOTE — Progress Notes (Signed)
[name], [place] [address]   CLINIC:  Medical Oncology/Hematology  PCP:  [name], MD [place] [address] [phone]   REASON FOR VISIT:  Follow-up for Stage IIB/IIIA NSCLC (T1b, N1-2, M0) right lung   CURRENT THERAPY:  Maintenance Imfinzi every 2 weeks, beginning 09/26/16   BRIEF ONCOLOGIC HISTORY:    Non-small cell lung cancer, right ([facility])   04/17/2016 Imaging    CT chest- 1. Examination is positive for bilateral upper lobe pulmonary lesions suspicious for malignancy. Further investigation with PET-CT and tissue sampling is advised. 2. Borderline enlarged paratracheal lymph nodes and right hilar nodes. Cannot rule out metastatic adenopathy. Attention to these areas on PET-CT is advised. 3. Emphysema 4. Aortic atherosclerosis and multi vessel coronary artery calcification.      04/18/2016 Initial Diagnosis    Adenocarcinoma of lung, right ([facility])      04/24/2016 PET scan    1. Adjacent hypermetabolic nodules in the RIGHT upper lobe consistent bronchogenic carcinoma. 2. Suspicion of ipsilateral hilar and paratracheal nodal metastasis. 3. Nodule in the LEFT upper lobe with suspicious morphology has a relatively low metabolic activity for size. Favor synchronous primary bronchogenic carcinoma. 4. No metabolic activity associated with a small LEFT lower lobe pulmonary nodule. Recommend attention on follow-up.      05/06/2016 Imaging    MRI brain- Negative for metastatic disease.      05/28/2016 Procedure    1. Electromagnetic navigational bronchoscopy with brushings, needle     aspirations and biopsies of bilateral upper lobe nodules. 2. Endobronchial ultrasound with mediastinal and hilar lymph node     aspirations. By Dr. [name]      06/03/2016 Pathology Results    Diagnosis 1. Lung, biopsy, Right upper lobe - BLOOD AND BENIGN BRONCHIAL AND LUNG TISSUE. 2. Lung, biopsy, Left upper lobe -  BLOOD AND BENIGN BRONCHIAL AND LUNG TISSUE.      06/03/2016 Pathology Results    Diagnosis TRANSBRONCHIAL NEEDLE ASPIRATION, NAVIGATION, RUL (SPECIMEN 5 OF 7, COLLECTED 05/28/16): MALIGNANT CELLS CONSISTENT WITH NON-SMALL CELL CARCINOMA Comment There are limited tumor cells in the cell block (insufficient for molecular testing). TTF-1 is positive in a few of the atypical cells. They appear negative for synaptophysin and cytokeratin 5/6. Overall there is limited tumor, but a lung adenocarcinoma is slightly favored. Dr. [name] has reviewed the case. The case was called to Dr. [name] on 06/03/2016.      06/13/2016 Procedure    Port placed by Dr. Arnoldo Morale      06/25/2016 - 08/15/2016 Chemotherapy    The patient had palonosetron (ALOXI) injection 0.25 mg, 0.25 mg, Intravenous,  Once, 6 of 6 cycles  CARBOplatin (PARAPLATIN) 220 mg in sodium chloride 0.9 % 250 mL chemo infusion, 220 mg (100 % of original dose 215.8 mg), Intravenous,  Once, 6 of 6 cycles Dose modification:   (original dose 215.8 mg, Cycle 1),   (original dose 215.8 mg, Cycle 5),   (original dose 215.8 mg, Cycle 6),   (original dose 215.8 mg, Cycle 2),   (original dose 215.8 mg, Cycle 3),   (original dose 215.8 mg, Cycle 4)  PACLitaxel (TAXOL) 90 mg in dextrose 5 % 250 mL chemo infusion ( for chemotherapy treatment.        06/25/2016 - 08/20/2016 Radiation Therapy    Radiation in [place], [place] by Dr. Quitman Livings.  R lung to 6600 cGy      07/09/2016 - 07/10/2016 Hospital Admission  Admit date: 07/09/2016  Admission diagnosis: Fever and chills Additional comments: Negative work-up, discharged with course of Levaquin      07/09/2016 Treatment Plan Change    Treatment deferred x 1 week due to hospitalization       08/05/2016 - 08/06/2016 Hospital Admission    Admit date: 08/05/2016 Admission diagnosis: Joint pain Additional comments: Treated with steroids with symptomatic improvement      08/25/2016 Imaging    MRI brain- Negative for  intracranial metastasis on this motion degraded examination.  Stable mild-to-moderate chronic small vessel ischemic disease.      09/18/2016 PET scan    1. Partial metabolic response. Right upper lobe 2.6 cm hypermetabolic pulmonary nodule, decreased in size and metabolism. Mildly hypermetabolic right hilar nodal metastasis, decreased in metabolism. Right paratracheal lymph node is no longer hypermetabolic. No new or progressive metastatic disease. 2. Apical left upper lobe 0.9 cm solid pulmonary nodule, slightly decreased in size, with no significant metabolism. The change in size could indicate a neoplastic etiology for this nodule. 3. Additional subcentimeter pulmonary nodules in the left lung are stable and below PET resolution . 4. Additional findings include aortic atherosclerosis, coronary atherosclerosis, moderate emphysema, stable small pericardial effusion/thickening and mild sigmoid diverticulosis.      01/13/2017 Imaging    CT chest with contrast IMPRESSION: 1. No change in size of the hypermetabolic nodular thickening in the RIGHT upper lobe. 2. LEFT upper lobe spiculated nodule is stable. 3. Rounded LEFT lower lobe pulmonary nodule is stable. 4. No evidence of lung cancer progression.        INTERVAL HISTORY:  John [name] 71 y.o. male returns for follow-up and for consideration of next cycle of therapy.  He continues to complain of fatigue and feeling sleepy all the time.  He states he has occasional shortness of breath.  He also complains of occasional constipation.  He also states that he has been feeling nauseated without any vomiting.  Has continued neuropathy in his fingers and feet however he states the gabapentin is helping with his neuropathy symptoms, he is currently taking gabapentin 4 times daily.  He denies any chest pain, abdominal pain, leg swelling, palpitations, melena, hematochezia, hematuria.  He denies any recent infections.  Appetite is at 75%, he  denies any weight loss.   REVIEW OF SYSTEMS:  Review of Systems  Constitutional: Positive for fatigue. Negative for chills and fever.  HENT:  Negative.   Eyes: Negative.   Respiratory: Positive for shortness of breath. Negative for cough.   Cardiovascular: Negative.  Negative for chest pain.  Gastrointestinal: Positive for constipation and nausea. Negative for abdominal pain, blood in stool, diarrhea and vomiting.  Endocrine: Negative.   Genitourinary: Negative.  Negative for dysuria and hematuria.   Musculoskeletal: Negative.   Skin: Negative for wound.  Neurological: Positive for numbness. Negative for dizziness and headaches.  Hematological: Negative.   Psychiatric/Behavioral: Negative.      PAST MEDICAL/SURGICAL HISTORY:  Past Medical History:  Diagnosis Date  . Adenocarcinoma of lung, right ([facility]) 04/18/2016  . Anxiety   . Arthritis   . CHF (congestive heart failure) ([facility])   . COPD (chronic obstructive pulmonary disease) ([facility])   . Depression   . Diabetes mellitus without complication ([facility])    no meds  . DM (diabetes mellitus) ([facility]) 07/09/2016  . Dyspnea   . History of kidney stones   . Hyperlipidemia   . Hypertension   . Macular degeneration   . Neuropathy   . Non-small cell  lung cancer, right ([facility]) 04/18/2016  . Prostatitis   . Pulmonary nodule, left 07/16/2016  . Sleep apnea    cpap   Past Surgical History:  Procedure Laterality Date  . CATARACT EXTRACTION, BILATERAL Bilateral   . NO PAST SURGERIES    . PORTACATH PLACEMENT Left 06/13/2016   Procedure: INSERTION PORT-A-CATH;  Surgeon: Aviva Signs, MD;  Location: [facility];  Service: General;  Laterality: Left;  Marland Kitchen VIDEO BRONCHOSCOPY WITH ENDOBRONCHIAL NAVIGATION N/A 05/28/2016   Procedure: VIDEO BRONCHOSCOPY WITH ENDOBRONCHIAL NAVIGATION;  Surgeon: [name], MD;  Location: [facility];  Service: Thoracic;  Laterality: N/A;  . VIDEO BRONCHOSCOPY WITH ENDOBRONCHIAL ULTRASOUND N/A 05/28/2016   Procedure: VIDEO  BRONCHOSCOPY WITH ENDOBRONCHIAL ULTRASOUND;  Surgeon: [name], MD;  Location: [facility];  Service: Thoracic;  Laterality: N/A;     SOCIAL HISTORY:  Social History   Socioeconomic History  . Marital status: Divorced    Spouse name: Not on file  . Number of children: Not on file  . Years of education: Not on file  . Highest education level: Not on file  Social Needs  . Financial resource strain: Not on file  . Food insecurity - worry: Not on file  . Food insecurity - inability: Not on file  . Transportation needs - medical: Not on file  . Transportation needs - non-medical: Not on file  Occupational History  . Not on file  Tobacco Use  . Smoking status: Current Every Day Smoker    Packs/day: 0.25    Years: 55.00    Pack years: 13.75    Types: Cigarettes    Start date: 03/11/1961  . Smokeless tobacco: Never Used  . Tobacco comment: smoking 1 to 2 cigarettes per day  Substance and Sexual Activity  . Alcohol use: No  . Drug use: No  . Sexual activity: Yes    Birth control/protection: None  Other Topics Concern  . Not on file  Social History Narrative  . Not on file    FAMILY HISTORY:  Family History  Problem Relation Age of Onset  . Hypertension Mother   . Diabetes Father   . Heart disease Father   . Hypertension Sister     CURRENT MEDICATIONS:  Outpatient Encounter Medications as of 03/27/2017  Medication Sig Note  . acetaminophen (TYLENOL) 325 MG tablet Take 2 tablets (650 mg total) by mouth every 6 (six) hours as needed for mild pain (or Fever >/= 101).   Marland Kitchen albuterol (PROVENTIL HFA;VENTOLIN HFA) 108 (90 Base) MCG/ACT inhaler Inhale 2 puffs into the lungs every 6 (six) hours as needed for wheezing or shortness of breath.   Marland Kitchen aspirin EC 81 MG tablet Take 81 mg by mouth daily.   . cloNIDine (CATAPRES) 0.1 MG tablet Take 1 tablet (0.1 mg total) by mouth 3 (three) times daily.   [place] (IMFINZI IV) Inject into the vein. Every 2 weeks   . finasteride  (PROSCAR) 5 MG tablet TAKE 1 TABLET BY MOUTH EVERY DAY   . fluticasone (FLONASE) 50 MCG/ACT nasal spray Place 2 sprays into both nostrils daily. 12/05/2016: As needed  . Fluticasone-Salmeterol (ADVAIR DISKUS IN) Inhale 1 puff into the lungs 2 (two) times daily. 12/05/2016: As needed  . gabapentin (NEURONTIN) 300 MG capsule TAKE 1 CAPSULE BY MOUTH THREE TIMES A DAY   . HYDROcodone-acetaminophen (NORCO/VICODIN) 5-325 MG tablet Take 2 tablets by mouth every 4 (four) hours as needed. 12/05/2016: As needed  . ibuprofen (ADVIL,MOTRIN) 600 MG tablet Take 1  tablet (600 mg total) by mouth every 6 (six) hours as needed.   . INCRUSE ELLIPTA 62.5 MCG/INH AEPB TAKE 1 PUFF BY MOUTH EVERY DAY   . irbesartan-hydrochlorothiazide (AVALIDE) 300-12.5 MG tablet TAKE 1 TABLET BY MOUTH DAILY.   Marland Kitchen KLOR-CON M20 20 MEQ tablet TAKE 1 TABLET BY MOUTH EVERY DAY   . LORazepam (ATIVAN) 0.5 MG tablet TAKE 1 TABLET BY MOUTH TWICE A DAY AS NEEDED   . magic mouthwash w/lidocaine SOLN USE 1 TEASPOONFUL BY 15MINS BEFORE MEALS AND AS NEEDED FOR [place] AND SWALLOW 12/05/2016: As needed  . meloxicam (MOBIC) 7.5 MG tablet TAKE 1 TABLET (7.5 MG TOTAL) BY MOUTH DAILY AS NEEDED FOR PAIN (BACK PAIN).   Marland Kitchen omeprazole (PRILOSEC) 20 MG capsule Take 20 mg by mouth daily.    . ondansetron (ZOFRAN) 8 MG tablet Take 1 tablet (8 mg total) by mouth every 8 (eight) hours as needed for nausea or vomiting.   . pravastatin (PRAVACHOL) 40 MG tablet Take 40 mg by mouth every 3 (three) days. M-W-F   . prochlorperazine (COMPAZINE) 10 MG tablet Take 1 tablet (10 mg total) by mouth every 6 (six) hours as needed for nausea or vomiting.   . tamsulosin (FLOMAX) 0.4 MG CAPS capsule TAKE 2 CAPSULES (0.8 MG TOTAL) BY MOUTH DAILY.   Marland Kitchen TRADJENTA 5 MG TABS tablet TAKE 1 TABLET BY MOUTH EVERY DAY   . traZODone (DESYREL) 50 MG tablet Take 1 tablet (50 mg total) by mouth at bedtime as needed for sleep.   . varenicline (CHANTIX STARTING MONTH PAK) 0.5 MG X 11 & 1 MG X  42 tablet Take one 0.5 mg tablet by mouth once daily for 3 days, then increase to one 0.5 mg tablet twice daily for 4 days, then increase to one 1 mg tablet twice daily.    No facility-administered encounter medications on file as of 03/27/2017.     ALLERGIES:  No Known Allergies   PHYSICAL EXAM:  ECOG Performance status: 1 - Symptomatic, but independent.   Chemo RN vitals reviewed.   Physical Exam  Constitutional: He is oriented to person, place, and time and well-developed, well-nourished, and in no distress.  Seen in chemo chair in infusion area   HENT:  Head: Normocephalic.  Mouth/Throat: Oropharynx is clear and moist.  Eyes: Conjunctivae are normal. No scleral icterus.  Neck: Normal range of motion. Neck supple.  Cardiovascular: Normal rate and regular rhythm.  Pulmonary/Chest: Effort normal and breath sounds normal. No respiratory distress. He has no wheezes.  Abdominal: Soft. Bowel sounds are normal. There is no tenderness.  Musculoskeletal: Normal range of motion. He exhibits no edema.  Lymphadenopathy:    He has no cervical adenopathy.       Right: No supraclavicular adenopathy present.       Left: No supraclavicular adenopathy present.  Neurological: He is alert and oriented to person, place, and time. No cranial nerve deficit.  Skin: Skin is warm and dry. No rash noted.  Psychiatric: Mood, memory, affect and judgment normal.  Nursing note and vitals reviewed.    LABORATORY DATA:  I have reviewed the labs as listed.  CBC    Component Value Date/Time   WBC 7.6 03/27/2017 0918   RBC 3.82 (L) 03/27/2017 0918   HGB 12.5 (L) 03/27/2017 0918   HGB 9.6 (L) 08/28/2016 1117   HCT 37.9 (L) 03/27/2017 0918   HCT 27.7 (L) 08/28/2016 1117   PLT 339 03/27/2017 0918   PLT 268 08/28/2016 1117  MCV 99.2 03/27/2017 0918   MCV 96 08/28/2016 1117   MCH 32.7 03/27/2017 0918   MCHC 33.0 03/27/2017 0918   RDW 14.5 03/27/2017 0918   RDW 19.4 (H) 08/28/2016 1117   LYMPHSABS  0.6 (L) 03/27/2017 0918   LYMPHSABS 0.6 (L) 08/28/2016 1117   MONOABS 1.0 03/27/2017 0918   EOSABS 0.2 03/27/2017 0918   EOSABS 0.0 08/28/2016 1117   BASOSABS 0.0 03/27/2017 0918   BASOSABS 0.0 08/28/2016 1117   CMP Latest Ref Rng & Units 03/27/2017 03/13/2017 02/20/2017  Glucose 65 - 99 mg/dL 154(H) 141(H) 140(H)  BUN 6 - 20 mg/dL 21(H) 14 16  Creatinine 0.61 - 1.24 mg/dL 0.87 0.80 0.82  Sodium 135 - 145 mmol/L 136 133(L) 136  Potassium 3.5 - 5.1 mmol/L 3.8 3.7 3.6  Chloride 101 - 111 mmol/L 100(L) 99(L) 99(L)  CO2 22 - 32 mmol/L 25 27 28   Calcium 8.9 - 10.3 mg/dL 9.2 9.2 9.1  Total Protein 6.5 - 8.1 g/dL 7.2 7.0 7.2  Total Bilirubin 0.3 - 1.2 mg/dL 0.4 0.4 0.6  Alkaline Phos 38 - 126 U/L 82 93 87  AST 15 - 41 U/L 18 17 17   ALT 17 - 63 U/L 12(L) 14(L) 12(L)     PENDING LABS:    DIAGNOSTIC IMAGING:  PET scan: 09/18/16 CLINICAL DATA:  Subsequent treatment strategy for stage IIB (T1b N1 M0) right upper lobe non-small cell lung carcinoma status post concurrent chemoradiation therapy completed 08/20/2016.  EXAM: NUCLEAR MEDICINE PET SKULL BASE TO THIGH  TECHNIQUE: 8.5 mCi F-18 FDG was injected intravenously. Full-ring PET imaging was performed from the skull base to thigh after the radiotracer. CT data was obtained and used for attenuation correction and anatomic localization.  FASTING BLOOD GLUCOSE:  Value: 151 mg/dl  COMPARISON:  04/24/2016 PET-CT.  FINDINGS: NECK  No hypermetabolic lymph nodes in the neck.  CHEST  Hypermetabolic irregular right upper lobe 2.6 x 2.2 cm pulmonary nodule with max SUV 9.4 (series 7/image 16), previously 3.5 x 2.7 cm with max SUV 18.0, decreased in size and metabolism.  Right hilar node with borderline mild hypermetabolism (max SUV 3.2), decreased in metabolism (previous max SUV 4.4).  No additional enlarged or hypermetabolic hilar, mediastinal or axillary nodes. Previously described mildly hypermetabolic  right paratracheal node is no longer hypermetabolic.  Apical left upper lobe 0.9 cm solid pulmonary nodule (series 7/image 9) does not demonstrate significant metabolism (max SUV 0.8), slightly decreased from 1.1 cm.  Left upper lobe 4 mm (series 7/image 23) and left lower lobe 6 mm (series 7/image 38) solid pulmonary nodules are stable, below PET resolution and not associated with significant metabolism. No acute consolidative airspace disease or new significant pulmonary nodules.  Moderate centrilobular emphysema with diffuse bronchial wall thickening.  Left subclavian MediPort terminates in the middle third of the superior vena cava. Atherosclerotic nonaneurysmal thoracic aorta. Coronary atherosclerosis. Stable small pericardial effusion/thickening. No pneumothorax. No pleural effusions.  ABDOMEN/PELVIS  No abnormal hypermetabolic activity within the liver, pancreas, adrenal glands, or spleen. No hypermetabolic lymph nodes in the abdomen or pelvis. Scattered granulomatous calcifications in the liver are unchanged. Partially exophytic simple 3.7 cm posterior interpolar right renal cyst. Atherosclerotic nonaneurysmal abdominal aorta. Mild sigmoid diverticulosis. Mildly enlarged prostate.  SKELETON  No focal hypermetabolic activity to suggest skeletal metastasis.  IMPRESSION: 1. Partial metabolic response. Right upper lobe 2.6 cm hypermetabolic pulmonary nodule, decreased in size and metabolism. Mildly hypermetabolic right hilar nodal metastasis, decreased in metabolism. Right paratracheal lymph node is no longer  hypermetabolic. No new or progressive metastatic disease. 2. Apical left upper lobe 0.9 cm solid pulmonary nodule, slightly decreased in size, with no significant metabolism. The change in size could indicate a neoplastic etiology for this nodule. 3. Additional subcentimeter pulmonary nodules in the left lung are stable and below PET resolution . 4.  Additional findings include aortic atherosclerosis, coronary atherosclerosis, moderate emphysema, stable small pericardial effusion/thickening and mild sigmoid diverticulosis.   Electronically Signed   By: Ilona Sorrel M.D.   On: 09/18/2016 10:21     PATHOLOGY:  Bronch: 05/28/16     ASSESSMENT & PLAN:   Stage IIB/IIIA NSCLC (T1b, N1-2, M0) right lung:  -s/p concurrent chemoradiation x 6 cycles of weekly Carbo/Taxol; which he completed on 08/15/16.  Restaging PET scan in 09/2016 showed partial metabolic response to treatment and consolidative/maintenance therapy with Imfinzi started on 09/26/16.   -Due for cycle #13  Imfinzi today; labs reviewed and are adequate for treatment. We have been monitoring TSH for immune-mediate thyroid dysfunction, which has been normal thus far.   -Continue Imfinzi every 2 weeks as scheduled.  -Will order his 3 month restaging scans to be done in 1 month.  Tobacco use disorder:  -Patient did not tolerate chantix. He had severe nausea/vomiting. He has cut down on the amount of cigarettes he is smoking daily.  Diabetic Neuropathy: -Continue gabapentin QID, which is controlling his neuropathy but may be contributing to his somnolence symptoms.  Chronic Fatigue: -I believe this could be multifactorial from medications and just overall deconditioning. I have recommended for patient to increase daily activity and start exercising.    Dispo:  -Continue Imfinzi every 2 weeks.  -Return to cancer center 4 weeks for follow-up visit to review restaging CT scans   All questions were answered to patient's stated satisfaction. Encouraged patient to call with any new concerns or questions before his next visit to the cancer center and we can certain see him sooner, if needed.    Twana First, MD

## 2017-03-27 NOTE — Progress Notes (Signed)
John [name] tolerated Imfinzi infusion well without complaints or incident. Labs reviewed with Dr. Talbert Cage prior to administering this medication. VSS upon discharge. Pt discharged self ambulatory in satisfactory condition

## 2017-03-27 NOTE — Patient Instructions (Signed)
[facility] Discharge Instructions for Patients Receiving Chemotherapy   Beginning January 23rd 2017 lab work for the [facility] will be done in the  Main lab at [facility] on 1st floor. If you have a lab appointment with the [facility] please come in thru the  Main Entrance and check in at the main information desk   Today you received the following chemotherapy agents Imfinzi. Follow-up as scheduled. Call clinic for any questions or concerns  To help prevent nausea and vomiting after your treatment, we encourage you to take your nausea medication   If you develop nausea and vomiting, or diarrhea that is not controlled by your medication, call the clinic.  The clinic phone number is [phone]. Office hours are Monday-Friday 8:30am-5:00pm.  BELOW ARE SYMPTOMS THAT SHOULD BE REPORTED IMMEDIATELY:  *FEVER GREATER THAN 101.0 F  *CHILLS WITH OR WITHOUT FEVER  NAUSEA AND VOMITING THAT IS NOT CONTROLLED WITH YOUR NAUSEA MEDICATION  *UNUSUAL SHORTNESS OF BREATH  *UNUSUAL BRUISING OR BLEEDING  TENDERNESS IN MOUTH AND THROAT WITH OR WITHOUT PRESENCE OF ULCERS  *URINARY PROBLEMS  *BOWEL PROBLEMS  UNUSUAL RASH Items with * indicate a potential emergency and should be followed up as soon as possible. If you have an emergency after office hours please contact your primary care physician or go to the nearest emergency department.  Please call the clinic during office hours if you have any questions or concerns.   You may also contact the Patient Navigator at [phone] should you have any questions or need assistance in obtaining follow up care.      Resources For Cancer Patients and their Caregivers ? American Cancer Society: Can assist with transportation, wigs, general needs, runs Look Good Feel Better.        769 235 8261 ? Cancer Care: Provides financial assistance, online support groups, medication/co-pay assistance.  1-800-813-HOPE  305-562-7817) ? [place] Assists [place] cancer patients and their families through emotional , educational and financial support.  6146714500 ? [place] DSS Where to apply for food stamps, Medicaid and utility assistance. 651-561-3409 ? [employer]: Transportation to medical appointments. (647) [phone] ? Social Security Administration: May apply for disability if have a Stage IV cancer. 408-012-7571 231-377-2845 ? [employer], Disability and Transit Services: Assists with nutrition, care and transit needs. 587-873-2637

## 2017-03-27 NOTE — Patient Instructions (Signed)
[facility] at [facility] Discharge Instructions  RECOMMENDATIONS MADE BY THE CONSULTANT AND ANY TEST RESULTS WILL BE SENT TO YOUR REFERRING PHYSICIAN.  You were seen today by Dr. Louise Zhou    Thank you for choosing [facility] at [facility] to provide your oncology and hematology care.  To afford each patient quality time with our provider, please arrive at least 15 minutes before your scheduled appointment time.    If you have a lab appointment with the Cancer Center please come in thru the  Main Entrance and check in at the main information desk  You need to re-schedule your appointment should you arrive 10 or more minutes late.  We strive to give you quality time with our providers, and arriving late affects you and other patients whose appointments are after yours.  Also, if you no show three or more times for appointments you may be dismissed from the clinic at the providers discretion.     Again, thank you for choosing [facility].  Our hope is that these requests will decrease the amount of time that you wait before being seen by our physicians.       _____________________________________________________________  Should you have questions after your visit to  Cancer Center, please contact our office at [phone] between the hours of 8:30 a.m. and 4:30 p.m.  Voicemails left after 4:30 p.m. will not be returned until the following business day.  For prescription refill requests, have your pharmacy contact our office.       Resources For Cancer Patients and their Caregivers ? American Cancer Society: Can assist with transportation, wigs, general needs, runs Look Good Feel Better.        1-888-227-6333 ? Cancer Care: Provides financial assistance, online support groups, medication/co-pay assistance.  1-800-813-HOPE (4673) ? [facility] Assists [place] cancer patients and their  families through emotional , educational and financial support.  336-427-4357 ? [place] DSS Where to apply for food stamps, Medicaid and utility assistance. 336-342-1394 ? [facility]: Transportation to medical appointments. 336-347-2287 ? Social Security Administration: May apply for disability if have a Stage IV cancer. 336-342-7796 1-800-772-1213 ? [place] Aging, Disability and Transit Services: Assists with nutrition, care and transit needs. 336-349-2343  Cancer Center Support Programs: @10RELATIVEDAYS@ > Cancer Support Group  2nd Tuesday of the month 1pm-2pm, Journey Room  > Creative Journey  3rd Tuesday of the month 1130am-1pm, Journey Room  > Look Good Feel Better  1st Wednesday of the month 10am-12 noon, Journey Room (Call American Cancer Society to register 1-800-395-5775)    

## 2017-04-02 ENCOUNTER — Other Ambulatory Visit: Payer: Self-pay | Admitting: *Deleted

## 2017-04-02 MED ORDER — MELOXICAM 7.5 MG PO TABS: 7.5000 mg | ORAL_TABLET | Freq: Every day | ORAL | 0 refills | Status: DC | PRN

## 2017-04-06 DIAGNOSIS — S91101A Unspecified open wound of right great toe without damage to nail, initial encounter: Secondary | ICD-10-CM | POA: Diagnosis not present

## 2017-04-06 DIAGNOSIS — L84 Corns and callosities: Secondary | ICD-10-CM | POA: Diagnosis not present

## 2017-04-06 DIAGNOSIS — E11621 Type 2 diabetes mellitus with foot ulcer: Secondary | ICD-10-CM | POA: Diagnosis not present

## 2017-04-06 DIAGNOSIS — F17210 Nicotine dependence, cigarettes, uncomplicated: Secondary | ICD-10-CM | POA: Diagnosis not present

## 2017-04-06 DIAGNOSIS — E1142 Type 2 diabetes mellitus with diabetic polyneuropathy: Secondary | ICD-10-CM | POA: Diagnosis not present

## 2017-04-06 DIAGNOSIS — L97512 Non-pressure chronic ulcer of other part of right foot with fat layer exposed: Secondary | ICD-10-CM | POA: Diagnosis not present

## 2017-04-06 DIAGNOSIS — J449 Chronic obstructive pulmonary disease, unspecified: Secondary | ICD-10-CM | POA: Diagnosis not present

## 2017-04-07 DIAGNOSIS — G4730 Sleep apnea, unspecified: Secondary | ICD-10-CM

## 2017-04-07 HISTORY — DX: Sleep apnea, unspecified: G47.30

## 2017-04-10 ENCOUNTER — Encounter: Payer: Self-pay | Admitting: Oncology

## 2017-04-10 ENCOUNTER — Inpatient Hospital Stay (HOSPITAL_COMMUNITY): Payer: Medicare Other | Admitting: Oncology

## 2017-04-10 ENCOUNTER — Encounter (HOSPITAL_COMMUNITY): Payer: Self-pay

## 2017-04-10 ENCOUNTER — Other Ambulatory Visit: Payer: Self-pay

## 2017-04-10 ENCOUNTER — Inpatient Hospital Stay (HOSPITAL_COMMUNITY): Payer: Medicare Other | Attending: Oncology

## 2017-04-10 VITALS — BP 122/56 | HR 109 | Temp 98.6°F | Resp 20 | Wt 180.3 lb

## 2017-04-10 DIAGNOSIS — I509 Heart failure, unspecified: Secondary | ICD-10-CM | POA: Insufficient documentation

## 2017-04-10 DIAGNOSIS — Z79899 Other long term (current) drug therapy: Secondary | ICD-10-CM | POA: Diagnosis not present

## 2017-04-10 DIAGNOSIS — C3411 Malignant neoplasm of upper lobe, right bronchus or lung: Secondary | ICD-10-CM | POA: Diagnosis not present

## 2017-04-10 DIAGNOSIS — I313 Pericardial effusion (noninflammatory): Secondary | ICD-10-CM | POA: Insufficient documentation

## 2017-04-10 DIAGNOSIS — I2510 Atherosclerotic heart disease of native coronary artery without angina pectoris: Secondary | ICD-10-CM | POA: Insufficient documentation

## 2017-04-10 DIAGNOSIS — C771 Secondary and unspecified malignant neoplasm of intrathoracic lymph nodes: Secondary | ICD-10-CM | POA: Diagnosis not present

## 2017-04-10 DIAGNOSIS — E1140 Type 2 diabetes mellitus with diabetic neuropathy, unspecified: Secondary | ICD-10-CM | POA: Diagnosis not present

## 2017-04-10 DIAGNOSIS — K5730 Diverticulosis of large intestine without perforation or abscess without bleeding: Secondary | ICD-10-CM | POA: Diagnosis not present

## 2017-04-10 DIAGNOSIS — C3491 Malignant neoplasm of unspecified part of right bronchus or lung: Secondary | ICD-10-CM

## 2017-04-10 DIAGNOSIS — J449 Chronic obstructive pulmonary disease, unspecified: Secondary | ICD-10-CM | POA: Diagnosis not present

## 2017-04-10 DIAGNOSIS — I700 Atherosclerosis of aorta: Secondary | ICD-10-CM | POA: Insufficient documentation

## 2017-04-10 DIAGNOSIS — I110 Hypertensive heart disease with heart failure: Secondary | ICD-10-CM | POA: Insufficient documentation

## 2017-04-10 DIAGNOSIS — R110 Nausea: Secondary | ICD-10-CM | POA: Diagnosis not present

## 2017-04-10 DIAGNOSIS — Z5112 Encounter for antineoplastic immunotherapy: Secondary | ICD-10-CM | POA: Insufficient documentation

## 2017-04-10 LAB — CBC WITH DIFFERENTIAL/PLATELET
BASOS PCT: 1 %
Basophils Absolute: 0 10*3/uL (ref 0.0–0.1)
Eosinophils Absolute: 0.2 10*3/uL (ref 0.0–0.7)
Eosinophils Relative: 2 %
HEMATOCRIT: 38.5 % — AB (ref 39.0–52.0)
HEMOGLOBIN: 12.7 g/dL — AB (ref 13.0–17.0)
LYMPHS ABS: 0.8 10*3/uL (ref 0.7–4.0)
LYMPHS PCT: 10 %
MCH: 32.9 pg (ref 26.0–34.0)
MCHC: 33 g/dL (ref 30.0–36.0)
MCV: 99.7 fL (ref 78.0–100.0)
MONOS PCT: 12 %
Monocytes Absolute: 0.9 10*3/uL (ref 0.1–1.0)
NEUTROS ABS: 5.9 10*3/uL (ref 1.7–7.7)
NEUTROS PCT: 75 %
Platelets: 339 10*3/uL (ref 150–400)
RBC: 3.86 MIL/uL — ABNORMAL LOW (ref 4.22–5.81)
RDW: 14.7 % (ref 11.5–15.5)
WBC: 7.7 10*3/uL (ref 4.0–10.5)

## 2017-04-10 LAB — COMPREHENSIVE METABOLIC PANEL
ALBUMIN: 3.7 g/dL (ref 3.5–5.0)
ALT: 13 U/L — AB (ref 17–63)
AST: 19 U/L (ref 15–41)
Alkaline Phosphatase: 84 U/L (ref 38–126)
Anion gap: 14 (ref 5–15)
BILIRUBIN TOTAL: 0.5 mg/dL (ref 0.3–1.2)
BUN: 14 mg/dL (ref 6–20)
CO2: 24 mmol/L (ref 22–32)
Calcium: 9.1 mg/dL (ref 8.9–10.3)
Chloride: 99 mmol/L — ABNORMAL LOW (ref 101–111)
Creatinine, Ser: 0.92 mg/dL (ref 0.61–1.24)
GFR calc Af Amer: >60 mL/min (ref >60)
GFR calc non Af Amer: >60 mL/min (ref >60)
GLUCOSE: 152 mg/dL — AB (ref 65–99)
POTASSIUM: 3.6 mmol/L (ref 3.5–5.1)
Sodium: 137 mmol/L (ref 135–145)
TOTAL PROTEIN: 7.6 g/dL (ref 6.5–8.1)

## 2017-04-10 MED ORDER — SODIUM CHLORIDE 0.9% FLUSH
10.0000 mL | INTRAVENOUS | Status: DC | PRN
  Administered 2017-04-10: 10 mL
  Filled 2017-04-10: qty 10

## 2017-04-10 MED ORDER — SODIUM CHLORIDE 0.9 % IV SOLN
Freq: Once | INTRAVENOUS | Status: AC
  Administered 2017-04-10: 13:00:00 via INTRAVENOUS

## 2017-04-10 MED ORDER — HEPARIN SOD (PORK) LOCK FLUSH 100 UNIT/ML IV SOLN
500.0000 [IU] | Freq: Once | INTRAVENOUS | Status: AC | PRN
  Administered 2017-04-10: 500 [IU]

## 2017-04-10 MED ORDER — SODIUM CHLORIDE 0.9 % IV SOLN
740.0000 mg | Freq: Once | INTRAVENOUS | Status: AC
  Administered 2017-04-10: 740 mg via INTRAVENOUS
  Filled 2017-04-10: qty 10

## 2017-04-10 NOTE — Progress Notes (Unsigned)
[address], [place] [address]   CLINIC:  Medical Oncology/Hematology  PCP:  [name], MD [place] [address] [phone]   REASON FOR VISIT:  Follow-up for Stage IIB/IIIA NSCLC (T1b, N1-2, M0) right lung   CURRENT THERAPY:  Maintenance Imfinzi every 2 weeks, beginning 09/26/16   BRIEF ONCOLOGIC HISTORY:    Non-small cell lung cancer, right ([facility])   04/17/2016 Imaging    CT chest- 1. Examination is positive for bilateral upper lobe pulmonary lesions suspicious for malignancy. Further investigation with PET-CT and tissue sampling is advised. 2. Borderline enlarged paratracheal lymph nodes and right hilar nodes. Cannot rule out metastatic adenopathy. Attention to these areas on PET-CT is advised. 3. Emphysema 4. Aortic atherosclerosis and multi vessel coronary artery calcification.      04/18/2016 Initial Diagnosis    Adenocarcinoma of lung, right ([facility] [facility])      04/24/2016 PET scan    1. Adjacent hypermetabolic nodules in the RIGHT upper lobe consistent bronchogenic carcinoma. 2. Suspicion of ipsilateral hilar and paratracheal nodal metastasis. 3. Nodule in the LEFT upper lobe with suspicious morphology has a relatively low metabolic activity for size. Favor synchronous primary bronchogenic carcinoma. 4. No metabolic activity associated with a small LEFT lower lobe pulmonary nodule. Recommend attention on follow-up.      05/06/2016 Imaging    MRI brain- Negative for metastatic disease.      05/28/2016 Procedure    1. Electromagnetic navigational bronchoscopy with brushings, needle     aspirations and biopsies of bilateral upper lobe nodules. 2. Endobronchial ultrasound with mediastinal and hilar lymph node     aspirations. By Dr. [name]      06/03/2016 Pathology Results    Diagnosis 1. Lung, biopsy, Right upper lobe - BLOOD AND BENIGN BRONCHIAL AND LUNG TISSUE. 2. Lung, biopsy, Left upper lobe -  BLOOD AND BENIGN BRONCHIAL AND LUNG TISSUE.      06/03/2016 Pathology Results    Diagnosis TRANSBRONCHIAL NEEDLE ASPIRATION, NAVIGATION, RUL (SPECIMEN 5 OF 7, COLLECTED 05/28/16): MALIGNANT CELLS CONSISTENT WITH NON-SMALL CELL CARCINOMA Comment There are limited tumor cells in the cell block (insufficient for molecular testing). TTF-1 is positive in a few of the atypical cells. They appear negative for synaptophysin and cytokeratin 5/6. Overall there is limited tumor, but a lung adenocarcinoma is slightly favored. Dr. [name] has reviewed the case. The case was called to Dr. [name] on 06/03/2016.      06/13/2016 Procedure    Port placed by Dr. Arnoldo Morale      06/25/2016 - 08/15/2016 Chemotherapy    The patient had palonosetron (ALOXI) injection 0.25 mg, 0.25 mg, Intravenous,  Once, 6 of 6 cycles  CARBOplatin (PARAPLATIN) 220 mg in sodium chloride 0.9 % 250 mL chemo infusion, 220 mg (100 % of original dose 215.8 mg), Intravenous,  Once, 6 of 6 cycles Dose modification:   (original dose 215.8 mg, Cycle 1),   (original dose 215.8 mg, Cycle 5),   (original dose 215.8 mg, Cycle 6),   (original dose 215.8 mg, Cycle 2),   (original dose 215.8 mg, Cycle 3),   (original dose 215.8 mg, Cycle 4)  PACLitaxel (TAXOL) 90 mg in dextrose 5 % 250 mL chemo infusion ( for chemotherapy treatment.        06/25/2016 - 08/20/2016 Radiation Therapy    Radiation in [place], [place] by Dr. Quitman Livings.  R lung to 6600 cGy      07/09/2016 - 07/10/2016 Hospital Admission  Admit date: 07/09/2016  Admission diagnosis: Fever and chills Additional comments: Negative work-up, discharged with course of Levaquin      07/09/2016 Treatment Plan Change    Treatment deferred x 1 week due to hospitalization       08/05/2016 - 08/06/2016 Hospital Admission    Admit date: 08/05/2016 Admission diagnosis: Joint pain Additional comments: Treated with steroids with symptomatic improvement      08/25/2016 Imaging    MRI brain- Negative for  intracranial metastasis on this motion degraded examination.  Stable mild-to-moderate chronic small vessel ischemic disease.      09/18/2016 PET scan    1. Partial metabolic response. Right upper lobe 2.6 cm hypermetabolic pulmonary nodule, decreased in size and metabolism. Mildly hypermetabolic right hilar nodal metastasis, decreased in metabolism. Right paratracheal lymph node is no longer hypermetabolic. No new or progressive metastatic disease. 2. Apical left upper lobe 0.9 cm solid pulmonary nodule, slightly decreased in size, with no significant metabolism. The change in size could indicate a neoplastic etiology for this nodule. 3. Additional subcentimeter pulmonary nodules in the left lung are stable and below PET resolution . 4. Additional findings include aortic atherosclerosis, coronary atherosclerosis, moderate emphysema, stable small pericardial effusion/thickening and mild sigmoid diverticulosis.      01/13/2017 Imaging    CT chest with contrast IMPRESSION: 1. No change in size of the hypermetabolic nodular thickening in the RIGHT upper lobe. 2. LEFT upper lobe spiculated nodule is stable. 3. Rounded LEFT lower lobe pulmonary nodule is stable. 4. No evidence of lung cancer progression.        INTERVAL HISTORY:  Mr. Marchetti 72 y.o. male returns for follow-up and for consideration of next cycle of therapy.  He continues to complain of fatigue and feeling sleepy all the time.  He states he has occasional shortness of breath.  He also complains of occasional constipation.  He also states that he has been feeling nauseated without any vomiting.  Has continued neuropathy in his fingers and feet however he states the gabapentin is helping with his neuropathy symptoms, he is currently taking gabapentin 4 times daily.  He denies any chest pain, abdominal pain, leg swelling, palpitations, melena, hematochezia, hematuria.  He denies any recent infections.  Appetite is at 75%, he  denies any weight loss.   REVIEW OF SYSTEMS:  Review of Systems  Constitutional: Positive for fatigue. Negative for chills and fever.  HENT:  Negative.   Eyes: Negative.   Respiratory: Positive for shortness of breath. Negative for cough.   Cardiovascular: Negative.  Negative for chest pain.  Gastrointestinal: Positive for constipation and nausea. Negative for abdominal pain, blood in stool, diarrhea and vomiting.  Endocrine: Negative.   Genitourinary: Negative.  Negative for dysuria and hematuria.   Musculoskeletal: Negative.   Skin: Negative for wound.  Neurological: Positive for numbness. Negative for dizziness and headaches.  Hematological: Negative.   Psychiatric/Behavioral: Negative.      PAST MEDICAL/SURGICAL HISTORY:  Past Medical History:  Diagnosis Date  . Adenocarcinoma of lung, right ([facility]) 04/18/2016  . Anxiety   . Arthritis   . CHF (congestive heart failure) ([facility])   . COPD (chronic obstructive pulmonary disease) ([facility])   . Depression   . Diabetes mellitus without complication ([facility])    no meds  . DM (diabetes mellitus) ([facility]) 07/09/2016  . Dyspnea   . History of kidney stones   . Hyperlipidemia   . Hypertension   . Macular degeneration   . Neuropathy   . Non-small cell  lung cancer, right ([facility]) 04/18/2016  . Prostatitis   . Pulmonary nodule, left 07/16/2016  . Sleep apnea    cpap   Past Surgical History:  Procedure Laterality Date  . CATARACT EXTRACTION, BILATERAL Bilateral   . NO PAST SURGERIES    . PORTACATH PLACEMENT Left 06/13/2016   Procedure: INSERTION PORT-A-CATH;  Surgeon: Aviva Signs, MD;  Location: [facility];  Service: General;  Laterality: Left;  Marland Kitchen VIDEO BRONCHOSCOPY WITH ENDOBRONCHIAL NAVIGATION N/A 05/28/2016   Procedure: VIDEO BRONCHOSCOPY WITH ENDOBRONCHIAL NAVIGATION;  Surgeon: [name], MD;  Location: [facility];  Service: Thoracic;  Laterality: N/A;  . VIDEO BRONCHOSCOPY WITH ENDOBRONCHIAL ULTRASOUND N/A 05/28/2016   Procedure: VIDEO  BRONCHOSCOPY WITH ENDOBRONCHIAL ULTRASOUND;  Surgeon: [name], MD;  Location: [facility];  Service: Thoracic;  Laterality: N/A;     SOCIAL HISTORY:  Social History   Socioeconomic History  . Marital status: Divorced    Spouse name: Not on file  . Number of children: Not on file  . Years of education: Not on file  . Highest education level: Not on file  Social Needs  . Financial resource strain: Not on file  . Food insecurity - worry: Not on file  . Food insecurity - inability: Not on file  . Transportation needs - medical: Not on file  . Transportation needs - non-medical: Not on file  Occupational History  . Not on file  Tobacco Use  . Smoking status: Current Every Day Smoker    Packs/day: 0.25    Years: 55.00    Pack years: 13.75    Types: Cigarettes    Start date: 03/11/1961  . Smokeless tobacco: Never Used  . Tobacco comment: smoking 1 to 2 cigarettes per day  Substance and Sexual Activity  . Alcohol use: No  . Drug use: No  . Sexual activity: Yes    Birth control/protection: None  Other Topics Concern  . Not on file  Social History Narrative  . Not on file    FAMILY HISTORY:  Family History  Problem Relation Age of Onset  . Hypertension Mother   . Diabetes Father   . Heart disease Father   . Hypertension Sister     CURRENT MEDICATIONS:  Outpatient Encounter Medications as of 04/10/2017  Medication Sig Note  . acetaminophen (TYLENOL) 325 MG tablet Take 2 tablets (650 mg total) by mouth every 6 (six) hours as needed for mild pain (or Fever >/= 101).   Marland Kitchen albuterol (PROVENTIL HFA;VENTOLIN HFA) 108 (90 Base) MCG/ACT inhaler Inhale 2 puffs into the lungs every 6 (six) hours as needed for wheezing or shortness of breath.   Marland Kitchen aspirin EC 81 MG tablet Take 81 mg by mouth daily.   . cloNIDine (CATAPRES) 0.1 MG tablet Take 1 tablet (0.1 mg total) by mouth 3 (three) times daily.   [place] (IMFINZI IV) Inject into the vein. Every 2 weeks   . finasteride  (PROSCAR) 5 MG tablet TAKE 1 TABLET BY MOUTH EVERY DAY   . fluticasone (FLONASE) 50 MCG/ACT nasal spray Place 2 sprays into both nostrils daily. 12/05/2016: As needed  . Fluticasone-Salmeterol (ADVAIR DISKUS IN) Inhale 1 puff into the lungs 2 (two) times daily. 12/05/2016: As needed  . gabapentin (NEURONTIN) 300 MG capsule TAKE 1 CAPSULE BY MOUTH THREE TIMES A DAY   . HYDROcodone-acetaminophen (NORCO/VICODIN) 5-325 MG tablet Take 2 tablets by mouth every 4 (four) hours as needed. 12/05/2016: As needed  . ibuprofen (ADVIL,MOTRIN) 600 MG tablet Take 1  tablet (600 mg total) by mouth every 6 (six) hours as needed.   . INCRUSE ELLIPTA 62.5 MCG/INH AEPB TAKE 1 PUFF BY MOUTH EVERY DAY   . irbesartan-hydrochlorothiazide (AVALIDE) 300-12.5 MG tablet TAKE 1 TABLET BY MOUTH DAILY.   Marland Kitchen KLOR-CON M20 20 MEQ tablet TAKE 1 TABLET BY MOUTH EVERY DAY   . LORazepam (ATIVAN) 0.5 MG tablet TAKE 1 TABLET BY MOUTH TWICE A DAY AS NEEDED   . magic mouthwash w/lidocaine SOLN USE 1 TEASPOONFUL BY 15MINS BEFORE MEALS AND AS NEEDED FOR [place] AND SWALLOW 12/05/2016: As needed  . meloxicam (MOBIC) 7.5 MG tablet Take 1 tablet (7.5 mg total) by mouth daily as needed for pain (back pain).   Marland Kitchen omeprazole (PRILOSEC) 20 MG capsule Take 20 mg by mouth daily.    . ondansetron (ZOFRAN) 8 MG tablet Take 1 tablet (8 mg total) by mouth every 8 (eight) hours as needed for nausea or vomiting.   . pravastatin (PRAVACHOL) 40 MG tablet Take 40 mg by mouth every 3 (three) days. M-W-F   . prochlorperazine (COMPAZINE) 10 MG tablet Take 1 tablet (10 mg total) by mouth every 6 (six) hours as needed for nausea or vomiting.   . tamsulosin (FLOMAX) 0.4 MG CAPS capsule TAKE 2 CAPSULES (0.8 MG TOTAL) BY MOUTH DAILY.   Marland Kitchen TRADJENTA 5 MG TABS tablet TAKE 1 TABLET BY MOUTH EVERY DAY   . traZODone (DESYREL) 50 MG tablet Take 1 tablet (50 mg total) by mouth at bedtime as needed for sleep.   . varenicline (CHANTIX STARTING MONTH PAK) 0.5 MG X 11 & 1 MG X  42 tablet Take one 0.5 mg tablet by mouth once daily for 3 days, then increase to one 0.5 mg tablet twice daily for 4 days, then increase to one 1 mg tablet twice daily.    No facility-administered encounter medications on file as of 04/10/2017.     ALLERGIES:  No Known Allergies   PHYSICAL EXAM:  ECOG Performance status: 1 - Symptomatic, but independent.   Chemo RN vitals reviewed.   Physical Exam  Constitutional: He is oriented to person, place, and time and well-developed, well-nourished, and in no distress.  Seen in chemo chair in infusion area   HENT:  Head: Normocephalic.  Mouth/Throat: Oropharynx is clear and moist.  Eyes: Conjunctivae are normal. No scleral icterus.  Neck: Normal range of motion. Neck supple.  Cardiovascular: Normal rate and regular rhythm.  Pulmonary/Chest: Effort normal and breath sounds normal. No respiratory distress. He has no wheezes.  Abdominal: Soft. Bowel sounds are normal. There is no tenderness.  Musculoskeletal: Normal range of motion. He exhibits no edema.  Lymphadenopathy:    He has no cervical adenopathy.       Right: No supraclavicular adenopathy present.       Left: No supraclavicular adenopathy present.  Neurological: He is alert and oriented to person, place, and time. No cranial nerve deficit.  Skin: Skin is warm and dry. No rash noted.  Psychiatric: Mood, memory, affect and judgment normal.  Nursing note and vitals reviewed.    LABORATORY DATA:  I have reviewed the labs as listed.  CBC    Component Value Date/Time   WBC 7.6 03/27/2017 0918   RBC 3.82 (L) 03/27/2017 0918   HGB 12.5 (L) 03/27/2017 0918   HGB 9.6 (L) 08/28/2016 1117   HCT 37.9 (L) 03/27/2017 0918   HCT 27.7 (L) 08/28/2016 1117   PLT 339 03/27/2017 0918   PLT 268 08/28/2016 1117  MCV 99.2 03/27/2017 0918   MCV 96 08/28/2016 1117   MCH 32.7 03/27/2017 0918   MCHC 33.0 03/27/2017 0918   RDW 14.5 03/27/2017 0918   RDW 19.4 (H) 08/28/2016 1117   LYMPHSABS  0.6 (L) 03/27/2017 0918   LYMPHSABS 0.6 (L) 08/28/2016 1117   MONOABS 1.0 03/27/2017 0918   EOSABS 0.2 03/27/2017 0918   EOSABS 0.0 08/28/2016 1117   BASOSABS 0.0 03/27/2017 0918   BASOSABS 0.0 08/28/2016 1117   CMP Latest Ref Rng & Units 03/27/2017 03/13/2017 02/20/2017  Glucose 65 - 99 mg/dL 154(H) 141(H) 140(H)  BUN 6 - 20 mg/dL 21(H) 14 16  Creatinine 0.61 - 1.24 mg/dL 0.87 0.80 0.82  Sodium 135 - 145 mmol/L 136 133(L) 136  Potassium 3.5 - 5.1 mmol/L 3.8 3.7 3.6  Chloride 101 - 111 mmol/L 100(L) 99(L) 99(L)  CO2 22 - 32 mmol/L 25 27 28   Calcium 8.9 - 10.3 mg/dL 9.2 9.2 9.1  Total Protein 6.5 - 8.1 g/dL 7.2 7.0 7.2  Total Bilirubin 0.3 - 1.2 mg/dL 0.4 0.4 0.6  Alkaline Phos 38 - 126 U/L 82 93 87  AST 15 - 41 U/L 18 17 17   ALT 17 - 63 U/L 12(L) 14(L) 12(L)     PENDING LABS:    DIAGNOSTIC IMAGING:  PET scan: 09/18/16 CLINICAL DATA:  Subsequent treatment strategy for stage IIB (T1b N1 M0) right upper lobe non-small cell lung carcinoma status post concurrent chemoradiation therapy completed 08/20/2016.  EXAM: NUCLEAR MEDICINE PET SKULL BASE TO THIGH  TECHNIQUE: 8.5 mCi F-18 FDG was injected intravenously. Full-ring PET imaging was performed from the skull base to thigh after the radiotracer. CT data was obtained and used for attenuation correction and anatomic localization.  FASTING BLOOD GLUCOSE:  Value: 151 mg/dl  COMPARISON:  04/24/2016 PET-CT.  FINDINGS: NECK  No hypermetabolic lymph nodes in the neck.  CHEST  Hypermetabolic irregular right upper lobe 2.6 x 2.2 cm pulmonary nodule with max SUV 9.4 (series 7/image 16), previously 3.5 x 2.7 cm with max SUV 18.0, decreased in size and metabolism.  Right hilar node with borderline mild hypermetabolism (max SUV 3.2), decreased in metabolism (previous max SUV 4.4).  No additional enlarged or hypermetabolic hilar, mediastinal or axillary nodes. Previously described mildly hypermetabolic  right paratracheal node is no longer hypermetabolic.  Apical left upper lobe 0.9 cm solid pulmonary nodule (series 7/image 9) does not demonstrate significant metabolism (max SUV 0.8), slightly decreased from 1.1 cm.  Left upper lobe 4 mm (series 7/image 23) and left lower lobe 6 mm (series 7/image 38) solid pulmonary nodules are stable, below PET resolution and not associated with significant metabolism. No acute consolidative airspace disease or new significant pulmonary nodules.  Moderate centrilobular emphysema with diffuse bronchial wall thickening.  Left subclavian MediPort terminates in the middle third of the superior vena cava. Atherosclerotic nonaneurysmal thoracic aorta. Coronary atherosclerosis. Stable small pericardial effusion/thickening. No pneumothorax. No pleural effusions.  ABDOMEN/PELVIS  No abnormal hypermetabolic activity within the liver, pancreas, adrenal glands, or spleen. No hypermetabolic lymph nodes in the abdomen or pelvis. Scattered granulomatous calcifications in the liver are unchanged. Partially exophytic simple 3.7 cm posterior interpolar right renal cyst. Atherosclerotic nonaneurysmal abdominal aorta. Mild sigmoid diverticulosis. Mildly enlarged prostate.  SKELETON  No focal hypermetabolic activity to suggest skeletal metastasis.  IMPRESSION: 1. Partial metabolic response. Right upper lobe 2.6 cm hypermetabolic pulmonary nodule, decreased in size and metabolism. Mildly hypermetabolic right hilar nodal metastasis, decreased in metabolism. Right paratracheal lymph node is no longer  hypermetabolic. No new or progressive metastatic disease. 2. Apical left upper lobe 0.9 cm solid pulmonary nodule, slightly decreased in size, with no significant metabolism. The change in size could indicate a neoplastic etiology for this nodule. 3. Additional subcentimeter pulmonary nodules in the left lung are stable and below PET resolution . 4.  Additional findings include aortic atherosclerosis, coronary atherosclerosis, moderate emphysema, stable small pericardial effusion/thickening and mild sigmoid diverticulosis.   Electronically Signed   By: Ilona Sorrel M.D.   On: 09/18/2016 10:21     PATHOLOGY:  Bronch: 05/28/16     ASSESSMENT & PLAN:   Stage IIB/IIIA NSCLC (T1b, N1-2, M0) right lung:  -s/p concurrent chemoradiation x 6 cycles of weekly Carbo/Taxol; which he completed on 08/15/16.  Restaging PET scan in 09/2016 showed partial metabolic response to treatment and consolidative/maintenance therapy with Imfinzi started on 09/26/16.   -Due for cycle #14  Imfinzi today; labs reviewed and are adequate for treatment. We have been monitoring TSH for immune-mediate thyroid dysfunction, which has been normal thus far.   -Continue Imfinzi every 2 weeks as scheduled.  -Will order his 3 month restaging scans to be done in 1 month.  Tobacco use disorder:  -Patient did not tolerate chantix. He had severe nausea/vomiting. He has cut down on the amount of cigarettes he is smoking daily.  Diabetic Neuropathy: -Continue gabapentin QID, which is controlling his neuropathy but may be contributing to his somnolence symptoms.  Chronic Fatigue: -I believe this could be multifactorial from medications and just overall deconditioning. I have recommended for patient to increase daily activity and start exercising.    Dispo:  -Continue Imfinzi every 2 weeks.  -Return to cancer center 4 weeks for follow-up visit to review restaging CT scans   All questions were answered to patient's stated satisfaction. Encouraged patient to call with any new concerns or questions before his next visit to the cancer center and we can certain see him sooner, if needed.    Twana First, MD

## 2017-04-10 NOTE — Progress Notes (Signed)
Treatment given per orders. Patient tolerated it well without problems. Vitals stable and discharged home from clinic ambulatory. Follow up as scheduled.  

## 2017-04-10 NOTE — Patient Instructions (Signed)
[facility] Discharge Instructions for Patients Receiving Chemotherapy   Beginning January 23rd 2017 lab work for the Cancer Center will be done in the  Main lab at [facility] on 1st floor. If you have a lab appointment with the Cancer Center please come in thru the  Main Entrance and check in at the main information desk   Today you received the following chemotherapy agents   To help prevent nausea and vomiting after your treatment, we encourage you to take your nausea medication     If you develop nausea and vomiting, or diarrhea that is not controlled by your medication, call the clinic.  The clinic phone number is [phone]. Office hours are Monday-Friday 8:30am-5:00pm.  BELOW ARE SYMPTOMS THAT SHOULD BE REPORTED IMMEDIATELY:  *FEVER GREATER THAN 101.0 F  *CHILLS WITH OR WITHOUT FEVER  NAUSEA AND VOMITING THAT IS NOT CONTROLLED WITH YOUR NAUSEA MEDICATION  *UNUSUAL SHORTNESS OF BREATH  *UNUSUAL BRUISING OR BLEEDING  TENDERNESS IN MOUTH AND THROAT WITH OR WITHOUT PRESENCE OF ULCERS  *URINARY PROBLEMS  *BOWEL PROBLEMS  UNUSUAL RASH Items with * indicate a potential emergency and should be followed up as soon as possible. If you have an emergency after office hours please contact your primary care physician or go to the nearest emergency department.  Please call the clinic during office hours if you have any questions or concerns.   You may also contact the Patient Navigator at [phone] should you have any questions or need assistance in obtaining follow up care.      Resources For Cancer Patients and their Caregivers ? American Cancer Society: Can assist with transportation, wigs, general needs, runs Look Good Feel Better.        1-888-227-6333 ? Cancer Care: Provides financial assistance, online support groups, medication/co-pay assistance.  1-800-813-HOPE (4673) ? [facility] Assists [place] cancer  patients and their families through emotional , educational and financial support.  336-427-4357 ? [place] DSS Where to apply for food stamps, Medicaid and utility assistance. 336-342-1394 ? [facility]: Transportation to medical appointments. 336-347-2287 ? Social Security Administration: May apply for disability if have a Stage IV cancer. 336-342-7796 1-800-772-1213 ? [place] Aging, Disability and Transit Services: Assists with nutrition, care and transit needs. 336-349-2343         

## 2017-04-14 ENCOUNTER — Telehealth: Payer: Self-pay | Admitting: *Deleted

## 2017-04-14 MED ORDER — DUTASTERIDE 0.5 MG PO CAPS: 0.5000 mg | ORAL_CAPSULE | Freq: Every day | ORAL | 3 refills | Status: DC

## 2017-04-14 NOTE — Addendum Note (Signed)
Addended by: [name] on: 04/14/2017 11:46 AM   Modules accepted: Orders

## 2017-04-14 NOTE — Telephone Encounter (Signed)
Fax received Alternative requested Finasteride is non formulary drug Please advise [facility]

## 2017-04-14 NOTE — Telephone Encounter (Signed)
Changing finasteride to dutasteride given formulary changes.  We will ask nursing to inform patient  John Apple, MD [facility] 04/14/2017, 11:45 AM

## 2017-04-15 ENCOUNTER — Other Ambulatory Visit: Payer: Self-pay

## 2017-04-15 MED ORDER — IRBESARTAN-HYDROCHLOROTHIAZIDE 300-12.5 MG PO TABS: 1.0000 | ORAL_TABLET | Freq: Every day | ORAL | 0 refills | Status: DC

## 2017-04-17 ENCOUNTER — Other Ambulatory Visit: Payer: Self-pay | Admitting: *Deleted

## 2017-04-17 MED ORDER — MELOXICAM 7.5 MG PO TABS: 7.5000 mg | ORAL_TABLET | Freq: Every day | ORAL | 0 refills | Status: DC | PRN

## 2017-04-21 ENCOUNTER — Ambulatory Visit (HOSPITAL_COMMUNITY)
Admission: RE | Admit: 2017-04-21 | Discharge: 2017-04-21 | Disposition: A | Discharge status: Home / Self Care | Payer: Medicare Other | Source: Ambulatory Visit | Attending: Oncology | Admitting: Oncology

## 2017-04-21 DIAGNOSIS — C3490 Malignant neoplasm of unspecified part of unspecified bronchus or lung: Secondary | ICD-10-CM | POA: Diagnosis not present

## 2017-04-21 DIAGNOSIS — I313 Pericardial effusion (noninflammatory): Secondary | ICD-10-CM | POA: Diagnosis not present

## 2017-04-21 DIAGNOSIS — I700 Atherosclerosis of aorta: Secondary | ICD-10-CM | POA: Insufficient documentation

## 2017-04-21 DIAGNOSIS — N400 Enlarged prostate without lower urinary tract symptoms: Secondary | ICD-10-CM | POA: Diagnosis not present

## 2017-04-21 DIAGNOSIS — C3491 Malignant neoplasm of unspecified part of right bronchus or lung: Secondary | ICD-10-CM | POA: Insufficient documentation

## 2017-04-21 MED ORDER — IOPAMIDOL (ISOVUE-300) INJECTION 61%
100.0000 mL | Freq: Once | INTRAVENOUS | Status: AC | PRN
  Administered 2017-04-21: 100 mL via INTRAVENOUS

## 2017-04-22 ENCOUNTER — Telehealth: Payer: Self-pay | Admitting: Family Medicine

## 2017-04-22 NOTE — Telephone Encounter (Signed)
Spoke with pt regarding catheter Pt went to [facility] to have catheter removed last PM

## 2017-04-22 NOTE — Telephone Encounter (Signed)
**  [facility] After Hours/ Emergency Line Call**  Patient: John [name].  PCP: [name], MD  Patient calls to report that peripheral IV was left in after CT scan earlier today.  He is wondering what he should do.  He denies bleeding, swelling, pain at IV site.  We discussed considering going back to the hospital where he had the CT scan done to remove it versus waiting until the morning to have it removed in office.  Red flags discussed.  Patient will come in at 8:00 to the office to have peripheral IV removed.  Will forward to PCP.  Ashly M. Lajuana Ripple, DO

## 2017-04-24 ENCOUNTER — Inpatient Hospital Stay (HOSPITAL_BASED_OUTPATIENT_CLINIC_OR_DEPARTMENT_OTHER): Payer: Medicare Other | Admitting: Adult Health

## 2017-04-24 ENCOUNTER — Inpatient Hospital Stay (HOSPITAL_COMMUNITY): Payer: Medicare Other

## 2017-04-24 ENCOUNTER — Encounter (HOSPITAL_COMMUNITY): Payer: Self-pay

## 2017-04-24 ENCOUNTER — Encounter (HOSPITAL_COMMUNITY): Payer: Self-pay | Admitting: Internal Medicine

## 2017-04-24 VITALS — BP 135/69 | HR 102 | Temp 97.8°F | Resp 20 | Wt 183.2 lb

## 2017-04-24 DIAGNOSIS — I313 Pericardial effusion (noninflammatory): Secondary | ICD-10-CM | POA: Diagnosis not present

## 2017-04-24 DIAGNOSIS — E1140 Type 2 diabetes mellitus with diabetic neuropathy, unspecified: Secondary | ICD-10-CM

## 2017-04-24 DIAGNOSIS — I110 Hypertensive heart disease with heart failure: Secondary | ICD-10-CM

## 2017-04-24 DIAGNOSIS — I509 Heart failure, unspecified: Secondary | ICD-10-CM

## 2017-04-24 DIAGNOSIS — C3411 Malignant neoplasm of upper lobe, right bronchus or lung: Secondary | ICD-10-CM

## 2017-04-24 DIAGNOSIS — J449 Chronic obstructive pulmonary disease, unspecified: Secondary | ICD-10-CM | POA: Diagnosis not present

## 2017-04-24 DIAGNOSIS — R110 Nausea: Secondary | ICD-10-CM

## 2017-04-24 DIAGNOSIS — I700 Atherosclerosis of aorta: Secondary | ICD-10-CM | POA: Diagnosis not present

## 2017-04-24 DIAGNOSIS — K5730 Diverticulosis of large intestine without perforation or abscess without bleeding: Secondary | ICD-10-CM | POA: Diagnosis not present

## 2017-04-24 DIAGNOSIS — C771 Secondary and unspecified malignant neoplasm of intrathoracic lymph nodes: Secondary | ICD-10-CM | POA: Diagnosis not present

## 2017-04-24 DIAGNOSIS — I2510 Atherosclerotic heart disease of native coronary artery without angina pectoris: Secondary | ICD-10-CM

## 2017-04-24 DIAGNOSIS — Z79899 Other long term (current) drug therapy: Secondary | ICD-10-CM

## 2017-04-24 DIAGNOSIS — C3491 Malignant neoplasm of unspecified part of right bronchus or lung: Secondary | ICD-10-CM

## 2017-04-24 DIAGNOSIS — Z5112 Encounter for antineoplastic immunotherapy: Secondary | ICD-10-CM

## 2017-04-24 LAB — CBC WITH DIFFERENTIAL/PLATELET
Basophils Absolute: 0 10*3/uL (ref 0.0–0.1)
Basophils Relative: 0 %
Eosinophils Absolute: 0.2 10*3/uL (ref 0.0–0.7)
Eosinophils Relative: 2 %
HCT: 37.6 % — ABNORMAL LOW (ref 39.0–52.0)
Hemoglobin: 12.2 g/dL — ABNORMAL LOW (ref 13.0–17.0)
LYMPHS ABS: 0.7 10*3/uL (ref 0.7–4.0)
LYMPHS PCT: 9 %
MCH: 32.4 pg (ref 26.0–34.0)
MCHC: 32.4 g/dL (ref 30.0–36.0)
MCV: 100 fL (ref 78.0–100.0)
Monocytes Absolute: 0.9 10*3/uL (ref 0.1–1.0)
Monocytes Relative: 12 %
NEUTROS PCT: 77 %
Neutro Abs: 5.7 10*3/uL (ref 1.7–7.7)
Platelets: 302 10*3/uL (ref 150–400)
RBC: 3.76 MIL/uL — AB (ref 4.22–5.81)
RDW: 14.5 % (ref 11.5–15.5)
WBC: 7.4 10*3/uL (ref 4.0–10.5)

## 2017-04-24 LAB — COMPREHENSIVE METABOLIC PANEL
ALT: 15 U/L — ABNORMAL LOW (ref 17–63)
AST: 19 U/L (ref 15–41)
Albumin: 3.6 g/dL (ref 3.5–5.0)
Alkaline Phosphatase: 79 U/L (ref 38–126)
Anion gap: 11 (ref 5–15)
BUN: 18 mg/dL (ref 6–20)
CHLORIDE: 99 mmol/L — AB (ref 101–111)
CO2: 25 mmol/L (ref 22–32)
Calcium: 8.9 mg/dL (ref 8.9–10.3)
Creatinine, Ser: 0.78 mg/dL (ref 0.61–1.24)
Glucose, Bld: 154 mg/dL — ABNORMAL HIGH (ref 65–99)
POTASSIUM: 3.5 mmol/L (ref 3.5–5.1)
Sodium: 135 mmol/L (ref 135–145)
Total Bilirubin: 0.3 mg/dL (ref 0.3–1.2)
Total Protein: 7 g/dL (ref 6.5–8.1)

## 2017-04-24 MED ORDER — HEPARIN SOD (PORK) LOCK FLUSH 100 UNIT/ML IV SOLN
500.0000 [IU] | Freq: Once | INTRAVENOUS | Status: AC | PRN
  Administered 2017-04-24: 500 [IU]
  Filled 2017-04-24: qty 5

## 2017-04-24 MED ORDER — SODIUM CHLORIDE 0.9 % IV SOLN
740.0000 mg | Freq: Once | INTRAVENOUS | Status: AC
  Administered 2017-04-24: 740 mg via INTRAVENOUS
  Filled 2017-04-24: qty 10

## 2017-04-24 MED ORDER — SODIUM CHLORIDE 0.9% FLUSH
10.0000 mL | INTRAVENOUS | Status: DC | PRN
  Administered 2017-04-24: 10 mL
  Filled 2017-04-24: qty 10

## 2017-04-24 MED ORDER — SODIUM CHLORIDE 0.9 % IV SOLN
Freq: Once | INTRAVENOUS | Status: AC
  Administered 2017-04-24: 13:00:00 via INTRAVENOUS

## 2017-04-24 NOTE — Patient Instructions (Signed)
[facility] Discharge Instructions for Patients Receiving Chemotherapy   Beginning January 23rd 2017 lab work for the [facility] will be done in the  Main lab at [facility] on 1st floor. If you have a lab appointment with the [facility] please come in thru the  Main Entrance and check in at the main information desk   Today you received the following chemotherapy agents Imfinzi. Follow-up as scheduled. Call clinic for any questions or concerns  To help prevent nausea and vomiting after your treatment, we encourage you to take your nausea medication   If you develop nausea and vomiting, or diarrhea that is not controlled by your medication, call the clinic.  The clinic phone number is [phone]. Office hours are Monday-Friday 8:30am-5:00pm.  BELOW ARE SYMPTOMS THAT SHOULD BE REPORTED IMMEDIATELY:  *FEVER GREATER THAN 101.0 F  *CHILLS WITH OR WITHOUT FEVER  NAUSEA AND VOMITING THAT IS NOT CONTROLLED WITH YOUR NAUSEA MEDICATION  *UNUSUAL SHORTNESS OF BREATH  *UNUSUAL BRUISING OR BLEEDING  TENDERNESS IN MOUTH AND THROAT WITH OR WITHOUT PRESENCE OF ULCERS  *URINARY PROBLEMS  *BOWEL PROBLEMS  UNUSUAL RASH Items with * indicate a potential emergency and should be followed up as soon as possible. If you have an emergency after office hours please contact your primary care physician or go to the nearest emergency department.  Please call the clinic during office hours if you have any questions or concerns.   You may also contact the Patient Navigator at [phone] should you have any questions or need assistance in obtaining follow up care.      Resources For Cancer Patients and their Caregivers ? American Cancer Society: Can assist with transportation, wigs, general needs, runs Look Good Feel Better.        769 235 8261 ? Cancer Care: Provides financial assistance, online support groups, medication/co-pay assistance.  1-800-813-HOPE  305-562-7817) ? [place] Assists [place] cancer patients and their families through emotional , educational and financial support.  6146714500 ? [place] DSS Where to apply for food stamps, Medicaid and utility assistance. 651-561-3409 ? [employer]: Transportation to medical appointments. (647) [phone] ? Social Security Administration: May apply for disability if have a Stage IV cancer. 408-012-7571 231-377-2845 ? [employer], Disability and Transit Services: Assists with nutrition, care and transit needs. 587-873-2637

## 2017-04-24 NOTE — Progress Notes (Signed)
[place], [place] [address]   CLINIC:  Medical Oncology/Hematology  PCP:  [name], MD [place] [id number] [phone]   REASON FOR VISIT:  Follow-up for Stage IIB/IIIA NSCLC (T1b, N1-2, M0) right lung   CURRENT THERAPY:  Maintenance Imfinzi every 2 weeks, beginning 09/26/16   BRIEF ONCOLOGIC HISTORY:    Non-small cell lung cancer, right ([facility])   04/17/2016 Imaging    CT chest- 1. Examination is positive for bilateral upper lobe pulmonary lesions suspicious for malignancy. Further investigation with PET-CT and tissue sampling is advised. 2. Borderline enlarged paratracheal lymph nodes and right hilar nodes. Cannot rule out metastatic adenopathy. Attention to these areas on PET-CT is advised. 3. Emphysema 4. Aortic atherosclerosis and multi vessel coronary artery calcification.      04/18/2016 Initial Diagnosis    Adenocarcinoma of lung, right ([facility])      04/24/2016 PET scan    1. Adjacent hypermetabolic nodules in the RIGHT upper lobe consistent bronchogenic carcinoma. 2. Suspicion of ipsilateral hilar and paratracheal nodal metastasis. 3. Nodule in the LEFT upper lobe with suspicious morphology has a relatively low metabolic activity for size. Favor synchronous primary bronchogenic carcinoma. 4. No metabolic activity associated with a small LEFT lower lobe pulmonary nodule. Recommend attention on follow-up.      05/06/2016 Imaging    MRI brain- Negative for metastatic disease.      05/28/2016 Procedure    1. Electromagnetic navigational bronchoscopy with brushings, needle     aspirations and biopsies of bilateral upper lobe nodules. 2. Endobronchial ultrasound with mediastinal and hilar lymph node     aspirations. By Dr. [name]      06/03/2016 Pathology Results    Diagnosis 1. Lung, biopsy, Right upper lobe - BLOOD AND BENIGN BRONCHIAL AND LUNG TISSUE. 2. Lung, biopsy, Left upper lobe -  BLOOD AND BENIGN BRONCHIAL AND LUNG TISSUE.      06/03/2016 Pathology Results    Diagnosis TRANSBRONCHIAL NEEDLE ASPIRATION, NAVIGATION, RUL (SPECIMEN 5 OF 7, COLLECTED 05/28/16): MALIGNANT CELLS CONSISTENT WITH NON-SMALL CELL CARCINOMA Comment There are limited tumor cells in the cell block (insufficient for molecular testing). TTF-1 is positive in a few of the atypical cells. They appear negative for synaptophysin and cytokeratin 5/6. Overall there is limited tumor, but a lung adenocarcinoma is slightly favored. Dr. [name] has reviewed the case. The case was called to Dr. [name] on 06/03/2016.      06/13/2016 Procedure    Port placed by Dr. Arnoldo Morale      06/25/2016 - 08/15/2016 Chemotherapy    The patient had palonosetron (ALOXI) injection 0.25 mg, 0.25 mg, Intravenous,  Once, 6 of 6 cycles  CARBOplatin (PARAPLATIN) 220 mg in sodium chloride 0.9 % 250 mL chemo infusion, 220 mg (100 % of original dose 215.8 mg), Intravenous,  Once, 6 of 6 cycles Dose modification:   (original dose 215.8 mg, Cycle 1),   (original dose 215.8 mg, Cycle 5),   (original dose 215.8 mg, Cycle 6),   (original dose 215.8 mg, Cycle 2),   (original dose 215.8 mg, Cycle 3),   (original dose 215.8 mg, Cycle 4)  PACLitaxel (TAXOL) 90 mg in dextrose 5 % 250 mL chemo infusion ( for chemotherapy treatment.        06/25/2016 - 08/20/2016 Radiation Therapy    Radiation in [place], [place] by Dr. Quitman Livings.  R lung to 6600 cGy      07/09/2016 - 07/10/2016 Hospital Admission  Admit date: 07/09/2016  Admission diagnosis: Fever and chills Additional comments: Negative work-up, discharged with course of Levaquin      07/09/2016 Treatment Plan Change    Treatment deferred x 1 week due to hospitalization       08/05/2016 - 08/06/2016 Hospital Admission    Admit date: 08/05/2016 Admission diagnosis: Joint pain Additional comments: Treated with steroids with symptomatic improvement      08/25/2016 Imaging    MRI brain- Negative for  intracranial metastasis on this motion degraded examination.  Stable mild-to-moderate chronic small vessel ischemic disease.      09/18/2016 PET scan    1. Partial metabolic response. Right upper lobe 2.6 cm hypermetabolic pulmonary nodule, decreased in size and metabolism. Mildly hypermetabolic right hilar nodal metastasis, decreased in metabolism. Right paratracheal lymph node is no longer hypermetabolic. No new or progressive metastatic disease. 2. Apical left upper lobe 0.9 cm solid pulmonary nodule, slightly decreased in size, with no significant metabolism. The change in size could indicate a neoplastic etiology for this nodule. 3. Additional subcentimeter pulmonary nodules in the left lung are stable and below PET resolution . 4. Additional findings include aortic atherosclerosis, coronary atherosclerosis, moderate emphysema, stable small pericardial effusion/thickening and mild sigmoid diverticulosis.      01/13/2017 Imaging    CT chest with contrast IMPRESSION: 1. No change in size of the hypermetabolic nodular thickening in the RIGHT upper lobe. 2. LEFT upper lobe spiculated nodule is stable. 3. Rounded LEFT lower lobe pulmonary nodule is stable. 4. No evidence of lung cancer progression.        INTERVAL HISTORY:  John [name] 72 y.o. male returns for follow-up and for consideration of next cycle of therapy.   Here today with his wife. Due for next cycle of Imfinzi.   Overall, he tells me he has been feeling "pretty good."  He feels tired/fatigued often. He has mild nausea, but it is managed with his anti-emetics.  He has chronic cough and shortness of breath, which is no worse per patient.  He has constipation, which has been a longstanding issue for him as well. He takes Miralax PRN. "I don't go longer that 1 day without taking something if I don't have a bowel movement."  He has chronic peripheral neuropathy to his fingers and feet; also stable.  Denies any  fever/chills. Denies rash.    He recently had restaging CT scans and he would like to review those results together today.    Otherwise, he feels well and ready for next cycle of Imfinzi today.     REVIEW OF SYSTEMS:  Review of Systems  Constitutional: Positive for fatigue.  HENT:  Negative.   Eyes: Negative.   Respiratory: Positive for cough and shortness of breath.   Cardiovascular: Negative.   Gastrointestinal: Positive for constipation and nausea. Negative for vomiting.  Endocrine: Negative.   Genitourinary: Negative.    Musculoskeletal: Negative.   Skin: Negative.  Negative for rash.  Neurological: Positive for numbness.  Hematological: Negative.   Psychiatric/Behavioral: Negative.      PAST MEDICAL/SURGICAL HISTORY:  Past Medical History:  Diagnosis Date  . Adenocarcinoma of lung, right ([facility]) 04/18/2016  . Anxiety   . Arthritis   . CHF (congestive heart failure) ([facility])   . COPD (chronic obstructive pulmonary disease) ([facility])   . Depression   . Diabetes mellitus without complication ([facility])    no meds  . DM (diabetes mellitus) ([facility]) 07/09/2016  . Dyspnea   . History of kidney stones   .  Hyperlipidemia   . Hypertension   . Macular degeneration   . Neuropathy   . Non-small cell lung cancer, right ([facility]) 04/18/2016  . Prostatitis   . Pulmonary nodule, left 07/16/2016  . Sleep apnea    cpap   Past Surgical History:  Procedure Laterality Date  . CATARACT EXTRACTION, BILATERAL Bilateral   . NO PAST SURGERIES    . PORTACATH PLACEMENT Left 06/13/2016   Procedure: INSERTION PORT-A-CATH;  Surgeon: Aviva Signs, MD;  Location: [facility];  Service: General;  Laterality: Left;  Marland Kitchen VIDEO BRONCHOSCOPY WITH ENDOBRONCHIAL NAVIGATION N/A 05/28/2016   Procedure: VIDEO BRONCHOSCOPY WITH ENDOBRONCHIAL NAVIGATION;  Surgeon: [name], MD;  Location: [facility];  Service: Thoracic;  Laterality: N/A;  . VIDEO BRONCHOSCOPY WITH ENDOBRONCHIAL ULTRASOUND N/A 05/28/2016   Procedure: VIDEO  BRONCHOSCOPY WITH ENDOBRONCHIAL ULTRASOUND;  Surgeon: [name], MD;  Location: [facility];  Service: Thoracic;  Laterality: N/A;     SOCIAL HISTORY:  Social History   Socioeconomic History  . Marital status: Divorced    Spouse name: Not on file  . Number of children: Not on file  . Years of education: Not on file  . Highest education level: Not on file  Social Needs  . Financial resource strain: Not on file  . Food insecurity - worry: Not on file  . Food insecurity - inability: Not on file  . Transportation needs - medical: Not on file  . Transportation needs - non-medical: Not on file  Occupational History  . Not on file  Tobacco Use  . Smoking status: Current Every Day Smoker    Packs/day: 0.25    Years: 55.00    Pack years: 13.75    Types: Cigarettes    Start date: 03/11/1961  . Smokeless tobacco: Never Used  . Tobacco comment: smoking 1 to 2 cigarettes per day  Substance and Sexual Activity  . Alcohol use: No  . Drug use: No  . Sexual activity: Yes    Birth control/protection: None  Other Topics Concern  . Not on file  Social History Narrative  . Not on file    FAMILY HISTORY:  Family History  Problem Relation Age of Onset  . Hypertension Mother   . Diabetes Father   . Heart disease Father   . Hypertension Sister     CURRENT MEDICATIONS:  Outpatient Encounter Medications as of 04/24/2017  Medication Sig Note  . acetaminophen (TYLENOL) 325 MG tablet Take 2 tablets (650 mg total) by mouth every 6 (six) hours as needed for mild pain (or Fever >/= 101).   Marland Kitchen albuterol (PROVENTIL HFA;VENTOLIN HFA) 108 (90 Base) MCG/ACT inhaler Inhale 2 puffs into the lungs every 6 (six) hours as needed for wheezing or shortness of breath.   Marland Kitchen aspirin EC 81 MG tablet Take 81 mg by mouth daily.   . cloNIDine (CATAPRES) 0.1 MG tablet Take 1 tablet (0.1 mg total) by mouth 3 (three) times daily.   [place] (IMFINZI IV) Inject into the vein. Every 2 weeks   . dutasteride  (AVODART) 0.5 MG capsule Take 1 capsule (0.5 mg total) by mouth daily.   . finasteride (PROSCAR) 5 MG tablet TAKE 1 TABLET BY MOUTH EVERY DAY   . fluticasone (FLONASE) 50 MCG/ACT nasal spray Place 2 sprays into both nostrils daily. 12/05/2016: As needed  . Fluticasone-Salmeterol (ADVAIR DISKUS IN) Inhale 1 puff into the lungs 2 (two) times daily. 12/05/2016: As needed  . gabapentin (NEURONTIN) 300 MG capsule TAKE 1 CAPSULE BY  MOUTH THREE TIMES A DAY   . HYDROcodone-acetaminophen (NORCO/VICODIN) 5-325 MG tablet Take 2 tablets by mouth every 4 (four) hours as needed. 12/05/2016: As needed  . ibuprofen (ADVIL,MOTRIN) 600 MG tablet Take 1 tablet (600 mg total) by mouth every 6 (six) hours as needed.   . INCRUSE ELLIPTA 62.5 MCG/INH AEPB TAKE 1 PUFF BY MOUTH EVERY DAY   . irbesartan-hydrochlorothiazide (AVALIDE) 300-12.5 MG tablet Take 1 tablet by mouth daily.   Marland Kitchen KLOR-CON M20 20 MEQ tablet TAKE 1 TABLET BY MOUTH EVERY DAY   . LORazepam (ATIVAN) 0.5 MG tablet TAKE 1 TABLET BY MOUTH TWICE A DAY AS NEEDED   . magic mouthwash w/lidocaine SOLN USE 1 TEASPOONFUL BY 15MINS BEFORE MEALS AND AS NEEDED FOR [place] AND SWALLOW 12/05/2016: As needed  . meloxicam (MOBIC) 7.5 MG tablet Take 1 tablet (7.5 mg total) by mouth daily as needed for pain (back pain).   Marland Kitchen omeprazole (PRILOSEC) 20 MG capsule Take 20 mg by mouth daily.    . ondansetron (ZOFRAN) 8 MG tablet Take 1 tablet (8 mg total) by mouth every 8 (eight) hours as needed for nausea or vomiting.   . pravastatin (PRAVACHOL) 40 MG tablet Take 40 mg by mouth every 3 (three) days. M-W-F   . prochlorperazine (COMPAZINE) 10 MG tablet Take 1 tablet (10 mg total) by mouth every 6 (six) hours as needed for nausea or vomiting.   . tamsulosin (FLOMAX) 0.4 MG CAPS capsule TAKE 2 CAPSULES (0.8 MG TOTAL) BY MOUTH DAILY.   Marland Kitchen TRADJENTA 5 MG TABS tablet TAKE 1 TABLET BY MOUTH EVERY DAY   . traZODone (DESYREL) 50 MG tablet Take 1 tablet (50 mg total) by mouth at bedtime  as needed for sleep.   . varenicline (CHANTIX STARTING MONTH PAK) 0.5 MG X 11 & 1 MG X 42 tablet Take one 0.5 mg tablet by mouth once daily for 3 days, then increase to one 0.5 mg tablet twice daily for 4 days, then increase to one 1 mg tablet twice daily.    No facility-administered encounter medications on file as of 04/24/2017.     ALLERGIES:  No Known Allergies   PHYSICAL EXAM:  ECOG Performance status: 1 - Symptomatic, but independent.   BP 155/85 Pulse 104 Resp 18 Temp 98.0 O2 sat 97%  Weight 183.2 lbs   Physical Exam  Constitutional: He is oriented to person, place, and time and well-developed, well-nourished, and in no distress.  Seen in chemo chair in infusion area   HENT:  Head: Normocephalic.  Mouth/Throat: Oropharynx is clear and moist.  Eyes: Conjunctivae are normal. No scleral icterus.  Neck: Normal range of motion. Neck supple.  Cardiovascular: Normal rate and regular rhythm.  Pulmonary/Chest: Effort normal. No respiratory distress. He has no wheezes.  Diminished breath sounds to bilat bases   Abdominal: Soft. Bowel sounds are normal. There is no tenderness.  Musculoskeletal: Normal range of motion. He exhibits no edema.  Lymphadenopathy:    He has no cervical adenopathy.       Right: No supraclavicular adenopathy present.       Left: No supraclavicular adenopathy present.  Neurological: He is alert and oriented to person, place, and time. No cranial nerve deficit.  Skin: Skin is warm and dry. No rash noted.  Psychiatric: Mood, memory, affect and judgment normal.  Nursing note and vitals reviewed.    LABORATORY DATA:  I have reviewed the labs as listed.  CBC    Component Value Date/Time   WBC  7.4 04/24/2017 1117   RBC 3.76 (L) 04/24/2017 1117   HGB 12.2 (L) 04/24/2017 1117   HGB 9.6 (L) 08/28/2016 1117   HCT 37.6 (L) 04/24/2017 1117   HCT 27.7 (L) 08/28/2016 1117   PLT 302 04/24/2017 1117   PLT 268 08/28/2016 1117   MCV 100.0 04/24/2017 1117    MCV 96 08/28/2016 1117   MCH 32.4 04/24/2017 1117   MCHC 32.4 04/24/2017 1117   RDW 14.5 04/24/2017 1117   RDW 19.4 (H) 08/28/2016 1117   LYMPHSABS 0.7 04/24/2017 1117   LYMPHSABS 0.6 (L) 08/28/2016 1117   MONOABS 0.9 04/24/2017 1117   EOSABS 0.2 04/24/2017 1117   EOSABS 0.0 08/28/2016 1117   BASOSABS 0.0 04/24/2017 1117   BASOSABS 0.0 08/28/2016 1117   CMP Latest Ref Rng & Units 04/24/2017 04/10/2017 03/27/2017  Glucose 65 - 99 mg/dL 154(H) 152(H) 154(H)  BUN 6 - 20 mg/dL 18 14 21(H)  Creatinine 0.61 - 1.24 mg/dL 0.78 0.92 0.87  Sodium 135 - 145 mmol/L 135 137 136  Potassium 3.5 - 5.1 mmol/L 3.5 3.6 3.8  Chloride 101 - 111 mmol/L 99(L) 99(L) 100(L)  CO2 22 - 32 mmol/L 25 24 25   Calcium 8.9 - 10.3 mg/dL 8.9 9.1 9.2  Total Protein 6.5 - 8.1 g/dL 7.0 7.6 7.2  Total Bilirubin 0.3 - 1.2 mg/dL 0.3 0.5 0.4  Alkaline Phos 38 - 126 U/L 79 84 82  AST 15 - 41 U/L 19 19 18   ALT 17 - 63 U/L 15(L) 13(L) 12(L)     PENDING LABS:    DIAGNOSTIC IMAGING:  *I have reviewed the below diagnostic imaging and agree with radiologic reports as listed.   CT chest/abd/pelvis: 04/21/17 CLINICAL DATA:  Restaging non-small cell lung cancer.  EXAM: CT CHEST, ABDOMEN, AND PELVIS WITH CONTRAST  TECHNIQUE: Multidetector CT imaging of the chest, abdomen and pelvis was performed following the standard protocol during bolus administration of intravenous contrast.  CONTRAST:  166mL ISOVUE-300 IOPAMIDOL (ISOVUE-300) INJECTION 61%  COMPARISON:  Chest CT 01/13/2017 and PET-CT 09/18/2016  FINDINGS: CT CHEST FINDINGS  Cardiovascular: The heart is normal in size. Small stable pericardial effusion. Stable tortuosity and calcification of the thoracic aorta without dissection or focal aneurysm. Dense three-vessel coronary artery calcifications are again demonstrated.  Mediastinum/Nodes: The are no mediastinal or hilar mass or lymphadenopathy. Small scattered lymph nodes are unchanged. 5 mm upper  right mediastinal lymph node on image number 8 is stable  7.5 mm right paratracheal lymph node on image number 17 previously measured 7 mm.  8 mm aorticopulmonary window lymph node on image number 21 is stable. Right hilar soft tissue thickening is likely radiation change. No discrete hilar mass.  Lungs/Pleura: Progressive radiation changes involving the right paramediastinal long and right hilum. The right upper lobe pulmonary lesion measures approximately 16 x 9 mm and previously measured 15 x 11.5 mm.  Stable 7.5 mm left lower lobe pulmonary nodule on image number 85.  9 mm left apical nodule on image 20 previously measured 9.5 mm.  No new pulmonary lesions.  Stable advanced emphysematous changes. No acute overlying pulmonary process or pleural effusion.  Musculoskeletal: No chest wall mass, supraclavicular or axillary lymphadenopathy.  No significant bony findings.  CT ABDOMEN PELVIS FINDINGS  Hepatobiliary: No focal hepatic lesions to suggest metastatic disease. Small calcified granulomas are noted. The gallbladder is contracted. No intra or extrahepatic biliary dilatation.  Pancreas: Pancreas no mass, inflammation or ductal dilatation.  Spleen: Normal size.  No focal lesions.  Adrenals/Urinary Tract: Stable nodularity of the left adrenal gland. The right adrenal gland is normal.  No worrisome renal lesions or hydronephrosis. No renal or ureteral calculi. No renal or bladder mass. Stable right lower pole cyst.  Urachal remnant.  Stomach/Bowel: The stomach, duodenum, small bowel and colon are grossly normal without oral contrast. No inflammatory changes, mass lesions or obstructive findings. The terminal ileum and appendix are normal.  Vascular/Lymphatic: Stable advanced atherosclerotic calcifications involving the aorta and branch vessels. No aneurysm or dissection. The major venous structures are patent.  Reproductive: Normal sized  prostate gland but there is median lobe hypertrophy impressing on the base of the bladder. The seminal vesicles are normal.  Other: No pelvic mass or adenopathy. No free pelvic fluid collections. No inguinal mass or adenopathy. No abdominal wall hernia or subcutaneous lesions.  Musculoskeletal: No significant bony findings. Advanced degenerative changes involving the lumbar spine.  IMPRESSION: Impression  1. Stable to slightly smaller right upper lobe pulmonary lesion with progressive surrounding radiation changes. 2. Stable left apical and left lower lobe pulmonary nodules. 3. Stable emphysematous changes and areas of pulmonary scarring. 4. Stable small scattered mediastinal and hilar lymph nodes. No CT findings for metastatic disease involving the abdomen/pelvis or bony structures. 5. Advanced atherosclerotic calcifications involving the thoracic and abdominal aorta and branch vessels. 6. Stable small pericardial effusion. 7. Median lobe hypertrophy of the prostate gland.   Electronically Signed   By: Marijo Sanes M.D.   On: 04/21/2017 14:10     PATHOLOGY:  [place]: 05/28/16     ASSESSMENT & PLAN:   Stage IIB/IIIA NSCLC (T1b, N1-2, M0) right lung:  -s/p concurrent chemoradiation x 6 cycles of weekly Carbo/Taxol; which he completed on 08/15/16.  Restaging PET scan in 09/2016 showed partial metabolic response to treatment and consolidative/maintenance therapy with Imfinzi started on 09/26/16.   -Due for next cycle of Imfinzi today; labs reviewed and are adequate for treatment. We have been monitoring TSH for immune-mediate thyroid dysfunction, which has been normal thus far.   -Recent restaging CT chest/abd/pelvis done on 04/21/17 showed stable disease. No evidence of progression or new disease. Reviewed results with patient/family in detail today; copy of radiologic report also provided to them. Next restaging imaging will be due in ~3 months; will place orders at  future follow-up visits.  -Continue Imfinzi every 2 weeks as scheduled. Goal to complete 1 year of consolidative therapy with Imfinzi (though 09/2017).  -Return to cancer center in 2 weeks for follow-up and next cycle Imfinzi.    Nausea without vomiting:  -Stable. Continue anti-emetics PRN.          Dispo:  -Continue Imfinzi every 2 weeks.  -Return to cancer center in 2 weeks for follow-up visit with subsequent treatment.    All questions were answered to patient's stated satisfaction. Encouraged patient to call with any new concerns or questions before his next visit to the cancer center and we can certain see him sooner, if needed.        Orders placed this encounter:  No orders of the defined types were placed in this encounter.     [name], NP [place] [phone]

## 2017-04-24 NOTE — Progress Notes (Signed)
John [name] tolerated Imfinzi infusion well without complaints or incident. Labs reviewed with and pt seen by [name] NP prior to administering this medication. VSS upon discharge. Pt discharged self ambulatory in satisfactory condition accompanied by his wife

## 2017-04-25 ENCOUNTER — Encounter (HOSPITAL_COMMUNITY): Payer: Self-pay | Admitting: Adult Health

## 2017-04-25 MED ORDER — PEGFILGRASTIM INJECTION 6 MG/0.6ML ~~LOC~~
PREFILLED_SYRINGE | SUBCUTANEOUS | Status: AC
  Filled 2017-04-25: qty 0.6

## 2017-05-08 ENCOUNTER — Encounter: Payer: Self-pay | Admitting: Oncology

## 2017-05-08 ENCOUNTER — Other Ambulatory Visit (HOSPITAL_COMMUNITY): Payer: Self-pay | Admitting: Oncology

## 2017-05-08 ENCOUNTER — Inpatient Hospital Stay (HOSPITAL_COMMUNITY): Payer: Medicare Other

## 2017-05-08 ENCOUNTER — Inpatient Hospital Stay (HOSPITAL_COMMUNITY): Payer: Medicare Other | Attending: Oncology | Admitting: Oncology

## 2017-05-08 ENCOUNTER — Encounter (HOSPITAL_COMMUNITY): Payer: Self-pay

## 2017-05-08 VITALS — BP 128/64 | HR 100 | Temp 97.6°F | Resp 18 | Wt 182.8 lb

## 2017-05-08 DIAGNOSIS — F17210 Nicotine dependence, cigarettes, uncomplicated: Secondary | ICD-10-CM | POA: Diagnosis not present

## 2017-05-08 DIAGNOSIS — C3411 Malignant neoplasm of upper lobe, right bronchus or lung: Secondary | ICD-10-CM | POA: Diagnosis not present

## 2017-05-08 DIAGNOSIS — Z5112 Encounter for antineoplastic immunotherapy: Secondary | ICD-10-CM | POA: Insufficient documentation

## 2017-05-08 DIAGNOSIS — J431 Panlobular emphysema: Secondary | ICD-10-CM | POA: Diagnosis not present

## 2017-05-08 DIAGNOSIS — C3491 Malignant neoplasm of unspecified part of right bronchus or lung: Secondary | ICD-10-CM

## 2017-05-08 DIAGNOSIS — I110 Hypertensive heart disease with heart failure: Secondary | ICD-10-CM | POA: Diagnosis not present

## 2017-05-08 DIAGNOSIS — E1142 Type 2 diabetes mellitus with diabetic polyneuropathy: Secondary | ICD-10-CM

## 2017-05-08 DIAGNOSIS — I509 Heart failure, unspecified: Secondary | ICD-10-CM | POA: Insufficient documentation

## 2017-05-08 DIAGNOSIS — R5383 Other fatigue: Secondary | ICD-10-CM

## 2017-05-08 DIAGNOSIS — Z79899 Other long term (current) drug therapy: Secondary | ICD-10-CM | POA: Diagnosis not present

## 2017-05-08 DIAGNOSIS — E032 Hypothyroidism due to medicaments and other exogenous substances: Secondary | ICD-10-CM

## 2017-05-08 DIAGNOSIS — I10 Essential (primary) hypertension: Secondary | ICD-10-CM

## 2017-05-08 LAB — COMPREHENSIVE METABOLIC PANEL
ALBUMIN: 3.6 g/dL (ref 3.5–5.0)
ALT: 15 U/L — AB (ref 17–63)
AST: 21 U/L (ref 15–41)
Alkaline Phosphatase: 72 U/L (ref 38–126)
Anion gap: 12 (ref 5–15)
BUN: 23 mg/dL — AB (ref 6–20)
CHLORIDE: 98 mmol/L — AB (ref 101–111)
CO2: 26 mmol/L (ref 22–32)
CREATININE: 0.83 mg/dL (ref 0.61–1.24)
Calcium: 8.9 mg/dL (ref 8.9–10.3)
GFR calc Af Amer: >60 mL/min (ref >60)
GFR calc non Af Amer: >60 mL/min (ref >60)
GLUCOSE: 170 mg/dL — AB (ref 65–99)
Potassium: 3.5 mmol/L (ref 3.5–5.1)
SODIUM: 136 mmol/L (ref 135–145)
Total Bilirubin: 0.4 mg/dL (ref 0.3–1.2)
Total Protein: 7.1 g/dL (ref 6.5–8.1)

## 2017-05-08 LAB — CBC WITH DIFFERENTIAL/PLATELET
Basophils Absolute: 0 10*3/uL (ref 0.0–0.1)
Basophils Relative: 0 %
EOS ABS: 0.2 10*3/uL (ref 0.0–0.7)
EOS PCT: 2 %
HCT: 38.8 % — ABNORMAL LOW (ref 39.0–52.0)
Hemoglobin: 12.7 g/dL — ABNORMAL LOW (ref 13.0–17.0)
LYMPHS ABS: 0.7 10*3/uL (ref 0.7–4.0)
LYMPHS PCT: 9 %
MCH: 32.6 pg (ref 26.0–34.0)
MCHC: 32.7 g/dL (ref 30.0–36.0)
MCV: 99.7 fL (ref 78.0–100.0)
MONO ABS: 0.9 10*3/uL (ref 0.1–1.0)
MONOS PCT: 12 %
Neutro Abs: 5.8 10*3/uL (ref 1.7–7.7)
Neutrophils Relative %: 77 %
PLATELETS: 312 10*3/uL (ref 150–400)
RBC: 3.89 MIL/uL — ABNORMAL LOW (ref 4.22–5.81)
RDW: 14.4 % (ref 11.5–15.5)
WBC: 7.5 10*3/uL (ref 4.0–10.5)

## 2017-05-08 LAB — TSH: TSH: 5.153 u[IU]/mL — AB (ref 0.350–4.500)

## 2017-05-08 MED ORDER — SODIUM CHLORIDE 0.9% FLUSH
10.0000 mL | INTRAVENOUS | Status: DC | PRN
  Administered 2017-05-08: 10 mL
  Filled 2017-05-08: qty 10

## 2017-05-08 MED ORDER — LEVOTHYROXINE SODIUM 25 MCG PO TABS: 25.0000 ug | ORAL_TABLET | Freq: Every day | ORAL | 1 refills | Status: DC

## 2017-05-08 MED ORDER — SODIUM CHLORIDE 0.9 % IV SOLN
740.0000 mg | Freq: Once | INTRAVENOUS | Status: AC
  Administered 2017-05-08: 740 mg via INTRAVENOUS
  Filled 2017-05-08: qty 10

## 2017-05-08 MED ORDER — SODIUM CHLORIDE 0.9 % IV SOLN
Freq: Once | INTRAVENOUS | Status: AC
  Administered 2017-05-08: 12:00:00 via INTRAVENOUS

## 2017-05-08 MED ORDER — HEPARIN SOD (PORK) LOCK FLUSH 100 UNIT/ML IV SOLN
500.0000 [IU] | Freq: Once | INTRAVENOUS | Status: AC | PRN
  Administered 2017-05-08: 500 [IU]
  Filled 2017-05-08: qty 5

## 2017-05-08 NOTE — Progress Notes (Signed)
John Euler, MD [place] [id number]  Non-small cell lung cancer, right [facility]) - Plan: DISCONTINUED: sodium chloride flush (NS) 0.9 % injection 10 mL, DISCONTINUED: heparin lock flush 100 unit/mL, DISCONTINUED: 0.9 %  sodium chloride infusion  Type 2 diabetes mellitus with diabetic polyneuropathy, without long-term current use of insulin (HCC)  Panlobular emphysema (HCC)  Essential hypertension  Fatigue, unspecified type  CURRENT THERAPY: Consolidative Imfinzi beginning on 09/26/2016 (for 52 weeks total)  INTERVAL HISTORY: John [name] 72 y.o. male returns for followup of NSCLC  HPI Elements   Location: Right lung  Quality: NSCLC, favors adenocarcinoma  Severity: Stage IIIA  Duration: Dx on 05/28/2016  Context: CR on most recent imaging in 04/2017  Timing: Imfinzi therapy x 52 weeks beginning on 09/26/2016  Modifying Factors:   Associated Signs & Symptoms:    He is tolerating immunotherapy well.  He complains of some insomnia that is mild.  He reports trying medications in the past that were ineffective.   Review of Systems  Constitutional: Negative.  Negative for chills, fever and weight loss.  HENT: Negative.   Eyes: Negative.   Respiratory: Negative.  Negative for cough.   Cardiovascular: Negative.  Negative for chest pain.  Gastrointestinal: Negative.  Negative for blood in stool, constipation, diarrhea, melena, nausea and vomiting.  Genitourinary: Negative.   Musculoskeletal: Negative.   Skin: Negative.   Neurological: Negative.  Negative for weakness.  Endo/Heme/Allergies: Negative.   Psychiatric/Behavioral: Negative.     Past Medical History:  Diagnosis Date  . Adenocarcinoma of lung, right ([place]) 04/18/2016  . Anxiety   . Arthritis   . CHF (congestive heart failure) ([place])   . COPD (chronic obstructive pulmonary disease) ([place])   . Depression   . Diabetes mellitus without complication (HCC)    no meds  . DM (diabetes mellitus)  ([place]) 07/09/2016  . Dyspnea   . History of kidney stones   . Hyperlipidemia   . Hypertension   . Macular degeneration   . Neuropathy   . Non-small cell lung cancer, right ([place]) 04/18/2016  . Prostatitis   . Pulmonary nodule, left 07/16/2016  . Sleep apnea    cpap    Past Surgical History:  Procedure Laterality Date  . CATARACT EXTRACTION, BILATERAL Bilateral   . NO PAST SURGERIES    . PORTACATH PLACEMENT Left 06/13/2016   Procedure: INSERTION PORT-A-CATH;  Surgeon: Aviva Signs, MD;  Location: [facility];  Service: General;  Laterality: Left;  Marland Kitchen VIDEO BRONCHOSCOPY WITH ENDOBRONCHIAL NAVIGATION N/A 05/28/2016   Procedure: VIDEO BRONCHOSCOPY WITH ENDOBRONCHIAL NAVIGATION;  Surgeon: [name], MD;  Location: [place];  Service: Thoracic;  Laterality: N/A;  . VIDEO BRONCHOSCOPY WITH ENDOBRONCHIAL ULTRASOUND N/A 05/28/2016   Procedure: VIDEO BRONCHOSCOPY WITH ENDOBRONCHIAL ULTRASOUND;  Surgeon: [name], MD;  Location: [facility];  Service: Thoracic;  Laterality: N/A;    Family History  Problem Relation Age of Onset  . Hypertension Mother   . Diabetes Father   . Heart disease Father   . Hypertension Sister     Social History   Socioeconomic History  . Marital status: Divorced    Spouse name: Not on file  . Number of children: Not on file  . Years of education: Not on file  . Highest education level: Not on file  Social Needs  . Financial resource strain: Not on file  . Food insecurity - worry: Not on file  . Food insecurity -  inability: Not on file  . Transportation needs - medical: Not on file  . Transportation needs - non-medical: Not on file  Occupational History  . Not on file  Tobacco Use  . Smoking status: Current Every Day Smoker    Packs/day: 0.25    Years: 55.00    Pack years: 13.75    Types: Cigarettes    Start date: 03/11/1961  . Smokeless tobacco: Never Used  . Tobacco comment: smoking 1 to 2 cigarettes per day  Substance and Sexual Activity  .  Alcohol use: No  . Drug use: No  . Sexual activity: Yes    Birth control/protection: None  Other Topics Concern  . Not on file  Social History Narrative  . Not on file     PHYSICAL EXAMINATION  ECOG PERFORMANCE STATUS: 1 - Symptomatic but completely ambulatory  There were no vitals filed for this visit.  Blood pressure 137/77, pulse 103, respirations 20, temperature 97.15F, oxygen saturation 97% on room air  GENERAL:alert, no distress, well nourished, well developed, comfortable, cooperative, obese, smiling and in che[place]cliner and accompanied by wife SKIN: skin color, texture, turgor are normal, no rashes or significant lesions HEAD: Normocephalic, No masses, lesions, tenderness or abnormalities EYES: normal, EOMI, Conjunctiva are pink and non-injected EARS: External ears normal OROPHARYNX:lips, buccal mucosa, and tongue normal  NECK: supple, no adenopathy, trachea midline LYMPH:  no palpable lymphadenopathy BREAST:not examined LUNGS: expiratory wheezes bilaterally HEART: regular rate & rhythm, no murmurs, no gallops, S1 normal and S2 normal ABDOMEN:abdomen soft, non-tender and normal bowel sounds BACK: Back symmetric, no curvature. EXTREMITIES:less then 2 second capillary refill, no joint deformities, effusion, or inflammation, no skin discoloration, no cyanosis  NEURO: alert & oriented x 3 with fluent speech, no focal motor/sensory deficits, gait normal    LABORATORY DATA: CBC    Component Value Date/Time   WBC 7.5 05/08/2017 1056   RBC 3.89 (L) 05/08/2017 1056   HGB 12.7 (L) 05/08/2017 1056   HGB 9.6 (L) 08/28/2016 1117   HCT 38.8 (L) 05/08/2017 1056   HCT 27.7 (L) 08/28/2016 1117   PLT 312 05/08/2017 1056   PLT 268 08/28/2016 1117   MCV 99.7 05/08/2017 1056   MCV 96 08/28/2016 1117   MCH 32.6 05/08/2017 1056   MCHC 32.7 05/08/2017 1056   RDW 14.4 05/08/2017 1056   RDW 19.4 (H) 08/28/2016 1117   LYMPHSABS 0.7 05/08/2017 1056   LYMPHSABS 0.6 (L) 08/28/2016  1117   MONOABS 0.9 05/08/2017 1056   EOSABS 0.2 05/08/2017 1056   EOSABS 0.0 08/28/2016 1117   BASOSABS 0.0 05/08/2017 1056   BASOSABS 0.0 08/28/2016 1117      Chemistry      Component Value Date/Time   NA 136 05/08/2017 1056   NA 139 08/28/2016 1117   K 3.5 05/08/2017 1056   CL 98 (L) 05/08/2017 1056   CO2 26 05/08/2017 1056   BUN 23 (H) 05/08/2017 1056   BUN 15 08/28/2016 1117   CREATININE 0.83 05/08/2017 1056      Component Value Date/Time   CALCIUM 8.9 05/08/2017 1056   ALKPHOS 72 05/08/2017 1056   AST 21 05/08/2017 1056   ALT 15 (L) 05/08/2017 1056   BILITOT 0.4 05/08/2017 1056   BILITOT 0.5 08/28/2016 1117      Lab Results  Component Value Date   TSH 4.538 (H) 02/20/2017    PENDING LABS:   RADIOGRAPHIC STUDIES:  Ct Chest W Contrast  Result Date: 04/21/2017 CLINICAL DATA:  Restaging non-small  cell lung cancer. EXAM: CT CHEST, ABDOMEN, AND PELVIS WITH CONTRAST TECHNIQUE: Multidetector CT imaging of the chest, abdomen and pelvis was performed following the standard protocol during bolus administration of intravenous contrast. CONTRAST:  161mL ISOVUE-300 IOPAMIDOL (ISOVUE-300) INJECTION 61% COMPARISON:  Chest CT 01/13/2017 and PET-CT 09/18/2016 FINDINGS: CT CHEST FINDINGS Cardiovascular: The heart is normal in size. Small stable pericardial effusion. Stable tortuosity and calcification of the thoracic aorta without dissection or focal aneurysm. Dense three-vessel coronary artery calcifications are again demonstrated. Mediastinum/Nodes: The are no mediastinal or hilar mass or lymphadenopathy. Small scattered lymph nodes are unchanged. 5 mm upper right mediastinal lymph node on image number 8 is stable 7.5 mm right paratracheal lymph node on image number 17 previously measured 7 mm. 8 mm aorticopulmonary window lymph node on image number 21 is stable. Right hilar soft tissue thickening is likely radiation change. No discrete hilar mass. Lungs/Pleura: Progressive radiation  changes involving the right paramediastinal long and right hilum. The right upper lobe pulmonary lesion measures approximately 16 x 9 mm and previously measured 15 x 11.5 mm. Stable 7.5 mm left lower lobe pulmonary nodule on image number 85. 9 mm left apical nodule on image 20 previously measured 9.5 mm. No new pulmonary lesions. Stable advanced emphysematous changes. No acute overlying pulmonary process or pleural effusion. Musculoskeletal: No chest wall mass, supraclavicular or axillary lymphadenopathy. No significant bony findings. CT ABDOMEN PELVIS FINDINGS Hepatobiliary: No focal hepatic lesions to suggest metastatic disease. Small calcified granulomas are noted. The gallbladder is contracted. No intra or extrahepatic biliary dilatation. Pancreas: Pancreas no mass, inflammation or ductal dilatation. Spleen: Normal size.  No focal lesions. Adrenals/Urinary Tract: Stable nodularity of the left adrenal gland. The right adrenal gland is normal. No worrisome renal lesions or hydronephrosis. No renal or ureteral calculi. No renal or bladder mass. Stable right lower pole cyst. Urachal remnant. Stomach/Bowel: The stomach, duodenum, small bowel and colon are grossly normal without oral contrast. No inflammatory changes, mass lesions or obstructive findings. The terminal ileum and appendix are normal. Vascular/Lymphatic: Stable advanced atherosclerotic calcifications involving the aorta and branch vessels. No aneurysm or dissection. The major venous structures are patent. Reproductive: Normal sized prostate gland but there is median lobe hypertrophy impressing on the base of the bladder. The seminal vesicles are normal. Other: No pelvic mass or adenopathy. No free pelvic fluid collections. No inguinal mass or adenopathy. No abdominal wall hernia or subcutaneous lesions. Musculoskeletal: No significant bony findings. Advanced degenerative changes involving the lumbar spine. IMPRESSION: Impression 1. Stable to slightly  smaller right upper lobe pulmonary lesion with progressive surrounding radiation changes. 2. Stable left apical and left lower lobe pulmonary nodules. 3. Stable emphysematous changes and areas of pulmonary scarring. 4. Stable small scattered mediastinal and hilar lymph nodes. No CT findings for metastatic disease involving the abdomen/pelvis or bony structures. 5. Advanced atherosclerotic calcifications involving the thoracic and abdominal aorta and branch vessels. 6. Stable small pericardial effusion. 7. Median lobe hypertrophy of the prostate gland. Electronically Signed   By: Marijo Sanes M.D.   On: 04/21/2017 14:10   Ct Abdomen Pelvis W Contrast  Result Date: 04/21/2017 CLINICAL DATA:  Restaging non-small cell lung cancer. EXAM: CT CHEST, ABDOMEN, AND PELVIS WITH CONTRAST TECHNIQUE: Multidetector CT imaging of the chest, abdomen and pelvis was performed following the standard protocol during bolus administration of intravenous contrast. CONTRAST:  11mL ISOVUE-300 IOPAMIDOL (ISOVUE-300) INJECTION 61% COMPARISON:  Chest CT 01/13/2017 and PET-CT 09/18/2016 FINDINGS: CT CHEST FINDINGS Cardiovascular: The heart is normal in  size. Small stable pericardial effusion. Stable tortuosity and calcification of the thoracic aorta without dissection or focal aneurysm. Dense three-vessel coronary artery calcifications are again demonstrated. Mediastinum/Nodes: The are no mediastinal or hilar mass or lymphadenopathy. Small scattered lymph nodes are unchanged. 5 mm upper right mediastinal lymph node on image number 8 is stable 7.5 mm right paratracheal lymph node on image number 17 previously measured 7 mm. 8 mm aorticopulmonary window lymph node on image number 21 is stable. Right hilar soft tissue thickening is likely radiation change. No discrete hilar mass. Lungs/Pleura: Progressive radiation changes involving the right paramediastinal long and right hilum. The right upper lobe pulmonary lesion measures approximately 16  x 9 mm and previously measured 15 x 11.5 mm. Stable 7.5 mm left lower lobe pulmonary nodule on image number 85. 9 mm left apical nodule on image 20 previously measured 9.5 mm. No new pulmonary lesions. Stable advanced emphysematous changes. No acute overlying pulmonary process or pleural effusion. Musculoskeletal: No chest wall mass, supraclavicular or axillary lymphadenopathy. No significant bony findings. CT ABDOMEN PELVIS FINDINGS Hepatobiliary: No focal hepatic lesions to suggest metastatic disease. Small calcified granulomas are noted. The gallbladder is contracted. No intra or extrahepatic biliary dilatation. Pancreas: Pancreas no mass, inflammation or ductal dilatation. Spleen: Normal size.  No focal lesions. Adrenals/Urinary Tract: Stable nodularity of the left adrenal gland. The right adrenal gland is normal. No worrisome renal lesions or hydronephrosis. No renal or ureteral calculi. No renal or bladder mass. Stable right lower pole cyst. Urachal remnant. Stomach/Bowel: The stomach, duodenum, small bowel and colon are grossly normal without oral contrast. No inflammatory changes, mass lesions or obstructive findings. The terminal ileum and appendix are normal. Vascular/Lymphatic: Stable advanced atherosclerotic calcifications involving the aorta and branch vessels. No aneurysm or dissection. The major venous structures are patent. Reproductive: Normal sized prostate gland but there is median lobe hypertrophy impressing on the base of the bladder. The seminal vesicles are normal. Other: No pelvic mass or adenopathy. No free pelvic fluid collections. No inguinal mass or adenopathy. No abdominal wall hernia or subcutaneous lesions. Musculoskeletal: No significant bony findings. Advanced degenerative changes involving the lumbar spine. IMPRESSION: Impression 1. Stable to slightly smaller right upper lobe pulmonary lesion with progressive surrounding radiation changes. 2. Stable left apical and left lower lobe  pulmonary nodules. 3. Stable emphysematous changes and areas of pulmonary scarring. 4. Stable small scattered mediastinal and hilar lymph nodes. No CT findings for metastatic disease involving the abdomen/pelvis or bony structures. 5. Advanced atherosclerotic calcifications involving the thoracic and abdominal aorta and branch vessels. 6. Stable small pericardial effusion. 7. Median lobe hypertrophy of the prostate gland. Electronically Signed   By: Marijo Sanes M.D.   On: 04/21/2017 14:10     PATHOLOGY:    ASSESSMENT AND PLAN:  Non-small cell lung cancer, right (HCC) Stage IIIA (cT1B(2)cN2cM0) NSCLC, favoring adenocarcinoma with initial PET staging showing 2 RUL pulmonary nodules (1.7 and 2.0 cm) and ipsilateral paratracheal lymphadenopathy (mediastinal adenopathy).  S/P bronchoscopy on 05/28/2016.  LUL pulmonary nodule NOT-biopsy-proven but suspicious for imaging.  He is S/P chemoXRT with weekly Carboplatin/Paclitaxel and XRT (finisging on 08/20/2016) and now on consolidative Imfinzi therapy beginning on 09/26/2016 (for up to 52 weeks).  Pre-treatment labs today: CBC diff, CMET, TSH.  I personally reviewed and went over laboratory results with the patient.  The results are noted within this dictation.  Today is D1C16.  Return in 2 weeks for next cycle of Imfinzi.  Return in 4 weeks for follow-up with  ongoing immunotherapy.  Given his history of XRT and now on immunotherapy, we will need to be on the look-out for pneumonitis (higher incidence in bi-modality treatment and further compounded by immunotherapy).  Will also need to monitor for hypothyroidism and I recommend TSH every 4 weeks.    2. Type 2 diabetes mellitus with diabetic polyneuropathy, without long-term current use of insulin (HCC) Glucose today is 170  3. Panlobular emphysema ([place]) Secondary to tobacco smoking  4. Essential hypertension Controlled today  5. Fatigue, unspecified type Of note, TSH was 4.5 back in 02/2017.  He  is not on Levothyroxine.  I do not see any recent TSH checks.  TSH checked today.  If elevated, I would consider starting low-dose Levothyroxine 25 mcg daily.  This may be the source of his mild fatigue.   Final Result of Complexity      Choose decision making level with 2 or 3 checks OR choose the decision making level on Section B       A Number of diagnoses or treatment options  []   </= 1 Minimal  []   2 Limited  []   3 Multiple  [x]   >/= 4 Extensive  B Amount and complexity of data  []   </= 1 Minimal or low  []   2 Limited  [x]   3  Moderate  []   >/= 4 Extensive  C Highest risk  []   Minimal  []   Low  []   Moderate  [x]   High   Type of decision making  []   Straight-forward  []   Low Complexity  []   Moderate- Complexity  [x]   High- Complexity     ORDERS PLACED FOR THIS ENCOUNTER: No orders of the defined types were placed in this encounter.   MEDICATIONS PRESCRIBED THIS ENCOUNTER: No orders of the defined types were placed in this encounter.   THERAPY PLAN:  Continue with immunotherapy every 2 weeks as planned.  All questions were answered. The patient knows to call the clinic with any problems, questions or concerns. We can certainly see the patient much sooner if necessary.   Patient and plan discussed with Dr. [place] and she is in agreement with the aforementioned.   This note is electronically signed by: Robynn Pane, PA-C 05/08/2017 12:14 PM

## 2017-05-08 NOTE — Progress Notes (Signed)
[place] reviewed with and pt seen by Kirby Crigler PA and pt approved for Imfinzi infusion today per PA          [place] tolerated Imfinzi infusion well without complaints or incident. VSS upon discharge. Pt discharged self ambulatory in satisfactory condition accompanied by his wife

## 2017-05-08 NOTE — Assessment & Plan Note (Addendum)
Stage IIIA (cT1B(2)cN2cM0) NSCLC, favoring adenocarcinoma with initial PET staging showing 2 RUL pulmonary nodules (1.7 and 2.0 cm) and ipsilateral paratracheal lymphadenopathy (mediastinal adenopathy).  S/P bronchoscopy on 05/28/2016.  LUL pulmonary nodule NOT-biopsy-proven but suspicious for imaging.  He is S/P chemoXRT with weekly Carboplatin/Paclitaxel and XRT (finisging on 08/20/2016) and now on consolidative Imfinzi therapy beginning on 09/26/2016 (for up to 52 weeks).  Pre-treatment labs today: CBC diff, CMET, TSH.  I personally reviewed and went over laboratory results with the patient.  The results are noted within this dictation.  Today is D1C16.  Return in 2 weeks for next cycle of Imfinzi.  Return in 4 weeks for follow-up with ongoing immunotherapy.  Given his history of XRT and now on immunotherapy, we will need to be on the look-out for pneumonitis (higher incidence in bi-modality treatment and further compounded by immunotherapy).  Will also need to monitor for hypothyroidism and I recommend TSH every 4 weeks.

## 2017-05-08 NOTE — Patient Instructions (Addendum)
[place] at [facility] Discharge Instructions  RECOMMENDATIONS MADE BY THE CONSULTANT AND ANY TEST RESULTS WILL BE SENT TO YOUR REFERRING PHYSICIAN.  Labs today and treatment as planned. Return in 2 weeks for treatment. Return in 4 weeks for treatment and follow-up. Try Melatonin to help with sleep.     Thank you for choosing [place] at [facility] to provide your oncology and hematology care.  To afford each patient quality time with our provider, please arrive at least 15 minutes before your scheduled appointment time.    If you have a lab appointment with the [facility] please come in thru the  Main Entrance and check in at the main information desk  You need to re-schedule your appointment should you arrive 10 or more minutes late.  We strive to give you quality time with our providers, and arriving late affects you and other patients whose appointments are after yours.  Also, if you no show three or more times for appointments you may be dismissed from the clinic at the providers discretion.     Again, [facility]  Our hope is that these requests will decrease the amount of time that you wait before being seen by our physicians.       _____________________________________________________________  Should you have questions after your visit to [facility], please contact our office at [phone] between the hours of 8:30 a.m. and 4:30 p.m.  Voicemails left after 4:30 p.m. will not be returned until the following business day.  For prescription refill requests, have your pharmacy contact our office.       Resources For Cancer Patients and their Caregivers ? American Cancer Society: Can assist with transportation, wigs, general needs, runs Look Good Feel Better.        ([phone] ? Cancer Care: Provides financial assistance, online support groups, medication/co-pay  assistance.  1-800-813-HOPE 878-553-9561) ? [facility] Assists [place] cancer patients and their families through emotional , educational and financial support.  516-260-9210 ? [place] DSS Where to apply for food stamps, Medicaid and utility assistance. 551-087-4331 ? [facility]: Transportation to medical appointments. 743-236-4729 ? Social Security Administration: May apply for disability if have a Stage IV cancer. 613-317-2997 (440)028-0199 ? [employer], Disability and Transit Services: Assists with nutrition, care and transit needs. [place] Support Programs: @10RELATIVEDAYS @ > Cancer Support Group  2nd Tuesday of the month 1pm-2pm, Journey Room  > Creative Journey  3rd Tuesday of the month 1130am-1pm, Journey Room  > Look Good Feel Better  1st Wednesday of the month 10am-12 noon, Journey Room (Call [place] to register [phone])

## 2017-05-08 NOTE — Patient Instructions (Signed)
[facility] Discharge Instructions for Patients Receiving Chemotherapy   Beginning January 23rd 2017 lab work for the [facility] will be done in the  Main lab at [facility] on 1st floor. If you have a lab appointment with the [facility] please come in thru the  Main Entrance and check in at the main information desk   Today you received the following chemotherapy agents Imfinzi. Follow-up as scheduled. Call clinic for any questions or concerns  To help prevent nausea and vomiting after your treatment, we encourage you to take your nausea medication   If you develop nausea and vomiting, or diarrhea that is not controlled by your medication, call the clinic.  The clinic phone number is [phone]. Office hours are Monday-Friday 8:30am-5:00pm.  BELOW ARE SYMPTOMS THAT SHOULD BE REPORTED IMMEDIATELY:  *FEVER GREATER THAN 101.0 F  *CHILLS WITH OR WITHOUT FEVER  NAUSEA AND VOMITING THAT IS NOT CONTROLLED WITH YOUR NAUSEA MEDICATION  *UNUSUAL SHORTNESS OF BREATH  *UNUSUAL BRUISING OR BLEEDING  TENDERNESS IN MOUTH AND THROAT WITH OR WITHOUT PRESENCE OF ULCERS  *URINARY PROBLEMS  *BOWEL PROBLEMS  UNUSUAL RASH Items with * indicate a potential emergency and should be followed up as soon as possible. If you have an emergency after office hours please contact your primary care physician or go to the nearest emergency department.  Please call the clinic during office hours if you have any questions or concerns.   You may also contact the Patient Navigator at [phone] should you have any questions or need assistance in obtaining follow up care.      Resources For Cancer Patients and their Caregivers ? American Cancer Society: Can assist with transportation, wigs, general needs, runs Look Good Feel Better.        769 235 8261 ? Cancer Care: Provides financial assistance, online support groups, medication/co-pay assistance.  1-800-813-HOPE  305-562-7817) ? [place] Assists [place] cancer patients and their families through emotional , educational and financial support.  6146714500 ? [place] DSS Where to apply for food stamps, Medicaid and utility assistance. 651-561-3409 ? [employer]: Transportation to medical appointments. (647) [phone] ? Social Security Administration: May apply for disability if have a Stage IV cancer. 408-012-7571 231-377-2845 ? [employer], Disability and Transit Services: Assists with nutrition, care and transit needs. 587-873-2637

## 2017-05-11 ENCOUNTER — Other Ambulatory Visit: Payer: Self-pay | Admitting: *Deleted

## 2017-05-12 MED ORDER — MELOXICAM 7.5 MG PO TABS
7.5000 mg | ORAL_TABLET | Freq: Every day | ORAL | 1 refills | Status: DC | PRN
Start: 2017-05-12 — End: 2017-07-31

## 2017-05-22 ENCOUNTER — Other Ambulatory Visit: Payer: Self-pay

## 2017-05-22 ENCOUNTER — Encounter (HOSPITAL_COMMUNITY): Payer: Self-pay

## 2017-05-22 ENCOUNTER — Inpatient Hospital Stay (HOSPITAL_COMMUNITY): Payer: Medicare Other

## 2017-05-22 ENCOUNTER — Ambulatory Visit (HOSPITAL_COMMUNITY): Payer: Medicare Other | Admitting: Internal Medicine

## 2017-05-22 VITALS — BP 145/60 | HR 100 | Temp 98.6°F | Resp 16 | Wt 182.8 lb

## 2017-05-22 DIAGNOSIS — E1142 Type 2 diabetes mellitus with diabetic polyneuropathy: Secondary | ICD-10-CM | POA: Diagnosis not present

## 2017-05-22 DIAGNOSIS — C3491 Malignant neoplasm of unspecified part of right bronchus or lung: Secondary | ICD-10-CM

## 2017-05-22 DIAGNOSIS — J431 Panlobular emphysema: Secondary | ICD-10-CM | POA: Diagnosis not present

## 2017-05-22 DIAGNOSIS — C3411 Malignant neoplasm of upper lobe, right bronchus or lung: Secondary | ICD-10-CM | POA: Diagnosis not present

## 2017-05-22 DIAGNOSIS — I110 Hypertensive heart disease with heart failure: Secondary | ICD-10-CM | POA: Diagnosis not present

## 2017-05-22 DIAGNOSIS — I509 Heart failure, unspecified: Secondary | ICD-10-CM | POA: Diagnosis not present

## 2017-05-22 DIAGNOSIS — Z5112 Encounter for antineoplastic immunotherapy: Secondary | ICD-10-CM | POA: Diagnosis not present

## 2017-05-22 LAB — CBC WITH DIFFERENTIAL/PLATELET
BASOS ABS: 0 10*3/uL (ref 0.0–0.1)
BASOS PCT: 0 %
Eosinophils Absolute: 0.2 10*3/uL (ref 0.0–0.7)
Eosinophils Relative: 2 %
HEMATOCRIT: 40.1 % (ref 39.0–52.0)
Hemoglobin: 13 g/dL (ref 13.0–17.0)
LYMPHS PCT: 8 %
Lymphs Abs: 0.6 10*3/uL — ABNORMAL LOW (ref 0.7–4.0)
MCH: 32.1 pg (ref 26.0–34.0)
MCHC: 32.4 g/dL (ref 30.0–36.0)
MCV: 99 fL (ref 78.0–100.0)
MONO ABS: 1 10*3/uL (ref 0.1–1.0)
Monocytes Relative: 14 %
NEUTROS ABS: 5.2 10*3/uL (ref 1.7–7.7)
Neutrophils Relative %: 76 %
Platelets: 296 10*3/uL (ref 150–400)
RBC: 4.05 MIL/uL — AB (ref 4.22–5.81)
RDW: 14.3 % (ref 11.5–15.5)
WBC: 6.9 10*3/uL (ref 4.0–10.5)

## 2017-05-22 LAB — COMPREHENSIVE METABOLIC PANEL
ALK PHOS: 82 U/L (ref 38–126)
ALT: 15 U/L — ABNORMAL LOW (ref 17–63)
AST: 17 U/L (ref 15–41)
Albumin: 3.5 g/dL (ref 3.5–5.0)
Anion gap: 10 (ref 5–15)
BILIRUBIN TOTAL: 0.2 mg/dL — AB (ref 0.3–1.2)
BUN: 21 mg/dL — ABNORMAL HIGH (ref 6–20)
CALCIUM: 8.9 mg/dL (ref 8.9–10.3)
CO2: 25 mmol/L (ref 22–32)
Chloride: 101 mmol/L (ref 101–111)
Creatinine, Ser: 0.82 mg/dL (ref 0.61–1.24)
GLUCOSE: 165 mg/dL — AB (ref 65–99)
POTASSIUM: 3.8 mmol/L (ref 3.5–5.1)
Sodium: 136 mmol/L (ref 135–145)
TOTAL PROTEIN: 7.1 g/dL (ref 6.5–8.1)

## 2017-05-22 LAB — LACTATE DEHYDROGENASE: LDH: 122 U/L (ref 98–192)

## 2017-05-22 LAB — TSH: TSH: 3.616 u[IU]/mL (ref 0.350–4.500)

## 2017-05-22 MED ORDER — SODIUM CHLORIDE 0.9 % IV SOLN
740.0000 mg | Freq: Once | INTRAVENOUS | Status: AC
  Administered 2017-05-22: 740 mg via INTRAVENOUS
  Filled 2017-05-22: qty 10

## 2017-05-22 MED ORDER — SODIUM CHLORIDE 0.9% FLUSH
10.0000 mL | INTRAVENOUS | Status: DC | PRN
  Administered 2017-05-22: 10 mL
  Filled 2017-05-22: qty 10

## 2017-05-22 MED ORDER — HEPARIN SOD (PORK) LOCK FLUSH 100 UNIT/ML IV SOLN
INTRAVENOUS | Status: AC
  Filled 2017-05-22: qty 5

## 2017-05-22 MED ORDER — HEPARIN SOD (PORK) LOCK FLUSH 100 UNIT/ML IV SOLN
500.0000 [IU] | Freq: Once | INTRAVENOUS | Status: AC | PRN
  Administered 2017-05-22: 500 [IU]

## 2017-05-22 MED ORDER — SODIUM CHLORIDE 0.9 % IV SOLN
Freq: Once | INTRAVENOUS | Status: AC
  Administered 2017-05-22: 12:00:00 via INTRAVENOUS

## 2017-05-22 NOTE — Patient Instructions (Signed)
[facility] Discharge Instructions for Patients Receiving Chemotherapy   Beginning January 23rd 2017 lab work for the Cancer Center will be done in the  Main lab at  on 1st floor. If you have a lab appointment with the Cancer Center please come in thru the  Main Entrance and check in at the main information desk   Today you received the following chemotherapy agents   To help prevent nausea and vomiting after your treatment, we encourage you to take your nausea medication     If you develop nausea and vomiting, or diarrhea that is not controlled by your medication, call the clinic.  The clinic phone number is [phone]. Office hours are Monday-Friday 8:30am-5:00pm.  BELOW ARE SYMPTOMS THAT SHOULD BE REPORTED IMMEDIATELY:  *FEVER GREATER THAN 101.0 F  *CHILLS WITH OR WITHOUT FEVER  NAUSEA AND VOMITING THAT IS NOT CONTROLLED WITH YOUR NAUSEA MEDICATION  *UNUSUAL SHORTNESS OF BREATH  *UNUSUAL BRUISING OR BLEEDING  TENDERNESS IN MOUTH AND THROAT WITH OR WITHOUT PRESENCE OF ULCERS  *URINARY PROBLEMS  *BOWEL PROBLEMS  UNUSUAL RASH Items with * indicate a potential emergency and should be followed up as soon as possible. If you have an emergency after office hours please contact your primary care physician or go to the nearest emergency department.  Please call the clinic during office hours if you have any questions or concerns.   You may also contact the Patient Navigator at [phone] should you have any questions or need assistance in obtaining follow up care.      Resources For Cancer Patients and their Caregivers ? American Cancer Society: Can assist with transportation, wigs, general needs, runs Look Good Feel Better.        1-888-227-6333 ? Cancer Care: Provides financial assistance, online support groups, medication/co-pay assistance.  1-800-813-HOPE (4673) ? [facility] Assists [place] cancer  patients and their families through emotional , educational and financial support.  336-427-4357 ? [place] DSS Where to apply for food stamps, Medicaid and utility assistance. 336-342-1394 ? [facility]: Transportation to medical appointments. 336-347-2287 ? Social Security Administration: May apply for disability if have a Stage IV cancer. 336-342-7796 1-800-772-1213 ? [place] A[facility], Disability and Transit Services: Assists with nutrition, care and transit needs. 336-349-2343         

## 2017-05-22 NOTE — Progress Notes (Signed)
Labs reviewed with MD and will proceed with treatment per MD.   Treatment given per orders. Patient tolerated it well without problems. Vitals stable and discharged home from clinic ambulatory. Follow up as scheduled.

## 2017-05-25 ENCOUNTER — Telehealth: Payer: Self-pay | Admitting: Cardiovascular Disease

## 2017-05-25 NOTE — Telephone Encounter (Signed)
Left message to return call 

## 2017-05-25 NOTE — Telephone Encounter (Signed)
Patient has been SOB since starting cancer treatments.  He scheduled appt in March, but wanted nurse to call to also

## 2017-05-25 NOTE — Telephone Encounter (Signed)
SOB since starting chemo infusion - going every other Friday.  Stays tired all the time, want to sleep a lot.  Will see cancer doctor in about 2 weeks.  No chest pain or dizziness.  182.6 weight currently - been that for about the last 6 months.  Does have OV scheduled for 06/22/2017 here in [place].  Was due for his 6 mo in January.

## 2017-05-26 ENCOUNTER — Encounter (HOSPITAL_BASED_OUTPATIENT_CLINIC_OR_DEPARTMENT_OTHER): Payer: Medicare Other | Attending: Internal Medicine

## 2017-05-26 DIAGNOSIS — Z7984 Long term (current) use of oral hypoglycemic drugs: Secondary | ICD-10-CM | POA: Insufficient documentation

## 2017-05-26 DIAGNOSIS — C3490 Malignant neoplasm of unspecified part of unspecified bronchus or lung: Secondary | ICD-10-CM | POA: Diagnosis not present

## 2017-05-26 DIAGNOSIS — S91101A Unspecified open wound of right great toe without damage to nail, initial encounter: Secondary | ICD-10-CM | POA: Diagnosis not present

## 2017-05-26 DIAGNOSIS — E1140 Type 2 diabetes mellitus with diabetic neuropathy, unspecified: Secondary | ICD-10-CM | POA: Diagnosis not present

## 2017-05-26 DIAGNOSIS — L84 Corns and callosities: Secondary | ICD-10-CM | POA: Diagnosis not present

## 2017-05-26 DIAGNOSIS — E1142 Type 2 diabetes mellitus with diabetic polyneuropathy: Secondary | ICD-10-CM | POA: Insufficient documentation

## 2017-05-26 DIAGNOSIS — Z923 Personal history of irradiation: Secondary | ICD-10-CM | POA: Insufficient documentation

## 2017-05-27 NOTE — Telephone Encounter (Signed)
Please advise on below  

## 2017-05-27 NOTE — Telephone Encounter (Signed)
Left message to return call 

## 2017-05-27 NOTE — Telephone Encounter (Signed)
He will need to call the oncology office. Could be related to side effects from chemo (anemia, etc).

## 2017-05-29 NOTE — Telephone Encounter (Signed)
Patient notified.  Suggested he call his oncologist today to make them aware of his symptoms.  If they feel he needs to be seen sooner than 06/22/17 - he will call office back to request ov if needed.

## 2017-05-29 NOTE — Telephone Encounter (Signed)
Left message to return call 

## 2017-06-05 ENCOUNTER — Other Ambulatory Visit: Payer: Self-pay | Admitting: Family Medicine

## 2017-06-05 ENCOUNTER — Other Ambulatory Visit: Payer: Self-pay

## 2017-06-05 ENCOUNTER — Encounter (HOSPITAL_COMMUNITY): Payer: Self-pay | Admitting: Internal Medicine

## 2017-06-05 ENCOUNTER — Encounter (HOSPITAL_COMMUNITY): Payer: Self-pay

## 2017-06-05 ENCOUNTER — Ambulatory Visit (HOSPITAL_COMMUNITY)
Admission: RE | Admit: 2017-06-05 | Discharge: 2017-06-05 | Disposition: A | Discharge status: Home / Self Care | Payer: Medicare Other | Source: Ambulatory Visit | Attending: Internal Medicine | Admitting: Internal Medicine

## 2017-06-05 ENCOUNTER — Inpatient Hospital Stay (HOSPITAL_COMMUNITY): Payer: Medicare Other | Attending: Oncology | Admitting: Internal Medicine

## 2017-06-05 ENCOUNTER — Inpatient Hospital Stay (HOSPITAL_COMMUNITY): Payer: Medicare Other

## 2017-06-05 VITALS — BP 162/71 | HR 100 | Temp 97.8°F | Resp 18 | Wt 187.8 lb

## 2017-06-05 DIAGNOSIS — R000 Tachycardia, unspecified: Secondary | ICD-10-CM | POA: Insufficient documentation

## 2017-06-05 DIAGNOSIS — R140 Abdominal distension (gaseous): Secondary | ICD-10-CM | POA: Diagnosis not present

## 2017-06-05 DIAGNOSIS — C3411 Malignant neoplasm of upper lobe, right bronchus or lung: Secondary | ICD-10-CM | POA: Insufficient documentation

## 2017-06-05 DIAGNOSIS — E039 Hypothyroidism, unspecified: Secondary | ICD-10-CM | POA: Insufficient documentation

## 2017-06-05 DIAGNOSIS — E1140 Type 2 diabetes mellitus with diabetic neuropathy, unspecified: Secondary | ICD-10-CM | POA: Insufficient documentation

## 2017-06-05 DIAGNOSIS — Z5112 Encounter for antineoplastic immunotherapy: Secondary | ICD-10-CM | POA: Insufficient documentation

## 2017-06-05 DIAGNOSIS — C3491 Malignant neoplasm of unspecified part of right bronchus or lung: Secondary | ICD-10-CM | POA: Insufficient documentation

## 2017-06-05 LAB — COMPREHENSIVE METABOLIC PANEL
ALT: 15 U/L — ABNORMAL LOW (ref 17–63)
AST: 18 U/L (ref 15–41)
Albumin: 3.6 g/dL (ref 3.5–5.0)
Alkaline Phosphatase: 75 U/L (ref 38–126)
Anion gap: 11 (ref 5–15)
BUN: 16 mg/dL (ref 6–20)
CO2: 26 mmol/L (ref 22–32)
Calcium: 9 mg/dL (ref 8.9–10.3)
Chloride: 99 mmol/L — ABNORMAL LOW (ref 101–111)
Creatinine, Ser: 0.84 mg/dL (ref 0.61–1.24)
GFR calc Af Amer: >60 mL/min (ref >60)
Glucose, Bld: 159 mg/dL — ABNORMAL HIGH (ref 65–99)
Potassium: 3.6 mmol/L (ref 3.5–5.1)
Sodium: 136 mmol/L (ref 135–145)
Total Bilirubin: 0.3 mg/dL (ref 0.3–1.2)
Total Protein: 7.1 g/dL (ref 6.5–8.1)

## 2017-06-05 LAB — CBC WITH DIFFERENTIAL/PLATELET
Basophils Absolute: 0 10*3/uL (ref 0.0–0.1)
Basophils Relative: 0 %
EOS PCT: 2 %
Eosinophils Absolute: 0.1 10*3/uL (ref 0.0–0.7)
HCT: 38.3 % — ABNORMAL LOW (ref 39.0–52.0)
HEMOGLOBIN: 12.8 g/dL — AB (ref 13.0–17.0)
LYMPHS ABS: 0.8 10*3/uL (ref 0.7–4.0)
LYMPHS PCT: 12 %
MCH: 32.9 pg (ref 26.0–34.0)
MCHC: 33.4 g/dL (ref 30.0–36.0)
MCV: 98.5 fL (ref 78.0–100.0)
Monocytes Absolute: 0.6 10*3/uL (ref 0.1–1.0)
Monocytes Relative: 8 %
NEUTROS PCT: 78 %
Neutro Abs: 5.2 10*3/uL (ref 1.7–7.7)
Platelets: 299 10*3/uL (ref 150–400)
RBC: 3.89 MIL/uL — AB (ref 4.22–5.81)
RDW: 14.3 % (ref 11.5–15.5)
WBC: 6.6 10*3/uL (ref 4.0–10.5)

## 2017-06-05 LAB — LACTATE DEHYDROGENASE: LDH: 121 U/L (ref 98–192)

## 2017-06-05 MED ORDER — SODIUM CHLORIDE 0.9 % IV SOLN
740.0000 mg | Freq: Once | INTRAVENOUS | Status: AC
  Administered 2017-06-05: 740 mg via INTRAVENOUS
  Filled 2017-06-05: qty 10

## 2017-06-05 MED ORDER — SODIUM CHLORIDE 0.9 % IV SOLN
Freq: Once | INTRAVENOUS | Status: AC
  Administered 2017-06-05: 14:00:00 via INTRAVENOUS

## 2017-06-05 MED ORDER — SODIUM CHLORIDE 0.9% FLUSH
10.0000 mL | INTRAVENOUS | Status: DC | PRN
  Administered 2017-06-05: 10 mL
  Filled 2017-06-05: qty 10

## 2017-06-05 MED ORDER — HEPARIN SOD (PORK) LOCK FLUSH 100 UNIT/ML IV SOLN
500.0000 [IU] | Freq: Once | INTRAVENOUS | Status: AC | PRN
  Administered 2017-06-05: 500 [IU]

## 2017-06-05 NOTE — Patient Instructions (Signed)
[facility] at [facility] Discharge Instructions   You were seen today by Dr. Mathis Dad Higgs Please get in with your primary care soon to adjust your thyroid medication if needed. We are going to send you for a basic xray of your abdomen before treatment today You will get treatment today Follow up in 2 weeks for treatment Follow up with provider in 4 weeks   Thank you for choosing [place] at [facility] to provide your oncology and hematology care.  To afford each patient quality time with our provider, please arrive at least 15 minutes before your scheduled appointment time.    If you have a lab appointment with the [facility] please come in thru the  Main Entrance and check in at the main information desk  You need to re-schedule your appointment should you arrive 10 or more minutes late.  We strive to give you quality time with our providers, and arriving late affects you and other patients whose appointments are after yours.  Also, if you no show three or more times for appointments you may be dismissed from the clinic at the providers discretion.     Again, thank you for choosing [facility].  Our hope is that these requests will decrease the amount of time that you wait before being seen by our physicians.       _____________________________________________________________  Should you have questions after your visit to [facility], please contact our office at [phone] between the hours of 8:30 a.m. and 4:30 p.m.  Voicemails left after 4:30 p.m. will not be returned until the following business day.  For prescription refill requests, have your pharmacy contact our office.       Resources For Cancer Patients and their Caregivers ? American Cancer Society: Can assist with transportation, wigs, general needs, runs Look Good Feel Better.        317-275-2264 ? Cancer Care: Provides financial  assistance, online support groups, medication/co-pay assistance.  1-800-813-HOPE (979) 658-9514) ? [facility] Assists [place] cancer patients and their families through emotional , educational and financial support.  615-169-1244 ? [place] DSS Where to apply for food stamps, Medicaid and utility assistance. 563-293-5016 ? RCATS: Transportation to medical appointments. (618)587-1569 ? Social Security Administration: May apply for disability if have a Stage IV cancer. 682-127-3318 [phone] ? [employer], Disability and Transit Services: Assists with nutrition, care and transit needs. [place] Support Programs: @10RELATIVEDAYS @  > Cancer Support Group  2nd Tuesday of the month 1pm-2pm, Journey Room   > Creative Journey  3rd Tuesday of the month 1130am-1pm, Journey Room

## 2017-06-05 NOTE — Patient Instructions (Signed)
[facility] Discharge Instructions for Patients Receiving Chemotherapy   Beginning January 23rd 2017 lab work for the Cancer Center will be done in the  Main lab at [facility] on 1st floor. If you have a lab appointment with the Cancer Center please come in thru the  Main Entrance and check in at the main information desk   Today you received the following chemotherapy agents   To help prevent nausea and vomiting after your treatment, we encourage you to take your nausea medication     If you develop nausea and vomiting, or diarrhea that is not controlled by your medication, call the clinic.  The clinic phone number is [phone]. Office hours are Monday-Friday 8:30am-5:00pm.  BELOW ARE SYMPTOMS THAT SHOULD BE REPORTED IMMEDIATELY:  *FEVER GREATER THAN 101.0 F  *CHILLS WITH OR WITHOUT FEVER  NAUSEA AND VOMITING THAT IS NOT CONTROLLED WITH YOUR NAUSEA MEDICATION  *UNUSUAL SHORTNESS OF BREATH  *UNUSUAL BRUISING OR BLEEDING  TENDERNESS IN MOUTH AND THROAT WITH OR WITHOUT PRESENCE OF ULCERS  *URINARY PROBLEMS  *BOWEL PROBLEMS  UNUSUAL RASH Items with * indicate a potential emergency and should be followed up as soon as possible. If you have an emergency after office hours please contact your primary care physician or go to the nearest emergency department.  Please call the clinic during office hours if you have any questions or concerns.   You may also contact the Patient Navigator at [phone] should you have any questions or need assistance in obtaining follow up care.      Resources For Cancer Patients and their Caregivers ? American Cancer Society: Can assist with transportation, wigs, general needs, runs Look Good Feel Better.        1-888-227-6333 ? Cancer Care: Provides financial assistance, online support groups, medication/co-pay assistance.  1-800-813-HOPE (4673) ? [facility] Assists [place] cancer  patients and their families through emotional , educational and financial support.  336-427-4357 ? [place] DSS Where to apply for food stamps, Medicaid and utility assistance. 336-342-1394 ? RCATS: Transportation to medical appointments. 336-347-2287 ? Social Security Administration: May apply for disability if have a Stage IV cancer. 336-342-7796 1-800-772-1213 ? [place] Aging, Disability and Transit Services: Assists with nutrition, care and transit needs. 336-349-2343         

## 2017-06-05 NOTE — Progress Notes (Signed)
Labs reviewed and xray done per orders. Proceed with treatment per MD.  Treatment given per orders. Patient tolerated it well without problems. Vitals stable and discharged home from clinic ambulatory. Follow up as scheduled.

## 2017-06-08 NOTE — Telephone Encounter (Signed)
OV 06/09/17

## 2017-06-09 ENCOUNTER — Encounter: Payer: Self-pay | Admitting: Family Medicine

## 2017-06-09 ENCOUNTER — Ambulatory Visit (INDEPENDENT_AMBULATORY_CARE_PROVIDER_SITE_OTHER): Payer: Medicare Other | Admitting: Family Medicine

## 2017-06-09 VITALS — BP 125/66 | HR 105 | Temp 97.7°F | Ht 66.0 in | Wt 187.6 lb

## 2017-06-09 DIAGNOSIS — I10 Essential (primary) hypertension: Secondary | ICD-10-CM

## 2017-06-09 DIAGNOSIS — E1142 Type 2 diabetes mellitus with diabetic polyneuropathy: Secondary | ICD-10-CM

## 2017-06-09 DIAGNOSIS — N401 Enlarged prostate with lower urinary tract symptoms: Secondary | ICD-10-CM

## 2017-06-09 DIAGNOSIS — R3912 Poor urinary stream: Secondary | ICD-10-CM | POA: Diagnosis not present

## 2017-06-09 DIAGNOSIS — E039 Hypothyroidism, unspecified: Secondary | ICD-10-CM | POA: Diagnosis not present

## 2017-06-09 LAB — BAYER DCA HB A1C WAIVED: HB A1C: 6.8 % (ref <7.0)

## 2017-06-09 MED ORDER — TAMSULOSIN HCL 0.4 MG PO CAPS: ORAL_CAPSULE | ORAL | 3 refills | Status: DC

## 2017-06-09 MED ORDER — LISINOPRIL-HYDROCHLOROTHIAZIDE 20-25 MG PO TABS
1.0000 | ORAL_TABLET | Freq: Every day | ORAL | 3 refills | Status: DC
Start: 2017-06-09 — End: 2018-02-10

## 2017-06-09 MED ORDER — GABAPENTIN 300 MG PO CAPS: ORAL_CAPSULE | ORAL | 3 refills | Status: DC

## 2017-06-09 NOTE — Progress Notes (Signed)
   HPI  Patient presents today for chronic medical conditions and new hypothyroidism.  Hypothyroidism was found with elevated TSH at oncology, he was started on low-dose Synthroid and his TSH has normalized. He does not feel a difference yet.  Diabetes Random blood sugars been 160-180. No hypoglycemia Tolerating Tradjenta well.   BPH Doing well with Flomax plus Proscar, needs refill of Flomax.  Also states that he has bilateral feet and finger tingling that improved with Neurontin, takes 1 capsule in the morning and 2 at night, needs refill.  PMH: Smoking status noted ROS: Per HPI  Objective: BP 125/66   Pulse (!) 105   Temp 97.7 F (36.5 C) (Oral)   Ht 5\' 6"  (1.676 m)   Wt 187 lb 9.6 oz (85.1 kg)   BMI 30.28 kg/m  Gen: NAD, alert, cooperative with exam HEENT: NCAT, EOMI, PERRL CV: RRR, good S1/S2, no murmur Resp: CTABL, no wheezes, non-labored Abd: SNTND, BS present, no guarding or organomegaly Ext: No edema, warm Neuro: Alert and oriented, No gross deficits  Assessment and plan:  #type 2 diabetes Patient reports reasonable random blood sugars. Continue Tradjenta A1c pending   #Hypertension There have been multiple ARB recalls, his pharmacy was not very clear whether or not his was recalled so we will go ahead and change to lisinopril. Currently on irbesartan-HCTZ 300/12.5.  Exact equivalents would be lisinopril/HCTZ 40/12.5, however this is not commercially available, will decrease to 20 lisinopril and increase HCTZ to 25 mg. Patient has regular  follow-up at oncology  #Hypothyroidism Patient with slightly elevated TSH, now normalized on 25 mcg Synthroid Continue, refilled Repeat labs in 3 months  #BPH Doing well with Flomax plus Proscar, refill   Orders Placed This Encounter  Procedures  . Bayer DCA Hb A1c Waived    Meds ordered this encounter  Medications  . lisinopril-hydrochlorothiazide (PRINZIDE,ZESTORETIC) 20-25 MG tablet    Sig: Take 1  tablet by mouth daily.    Dispense:  90 tablet    Refill:  3  . tamsulosin (FLOMAX) 0.4 MG CAPS capsule    Sig: TAKE 2 CAPSULES (0.8 MG TOTAL) BY MOUTH DAILY.    Dispense:  180 capsule    Refill:  3  . gabapentin (NEURONTIN) 300 MG capsule    Sig: Take 1 capsule in Am and 2 at night    Dispense:  270 capsule    Refill:  [place], MD [facility] 06/09/2017, 11:22 AM

## 2017-06-09 NOTE — Patient Instructions (Signed)
Great to see you!  Stop irbesartan, start lisinopril/HCTZ  Come back in 3 months unless you need [place] sooner,

## 2017-06-17 ENCOUNTER — Encounter: Payer: Self-pay | Admitting: *Deleted

## 2017-06-17 ENCOUNTER — Ambulatory Visit (INDEPENDENT_AMBULATORY_CARE_PROVIDER_SITE_OTHER): Payer: Medicare Other | Admitting: *Deleted

## 2017-06-17 VITALS — BP 129/81 | HR 111 | Ht 64.5 in | Wt 185.0 lb

## 2017-06-17 DIAGNOSIS — D229 Melanocytic nevi, unspecified: Secondary | ICD-10-CM

## 2017-06-17 DIAGNOSIS — Z0000 Encounter for general adult medical examination without abnormal findings: Secondary | ICD-10-CM

## 2017-06-17 DIAGNOSIS — Z23 Encounter for immunization: Secondary | ICD-10-CM

## 2017-06-17 NOTE — Patient Instructions (Signed)
  Mr. Dipiero , Thank you for taking time to come for your Medicare Wellness Visit. I appreciate your ongoing commitment to your health goals. Please review the following plan we discussed and let me know if I can assist you in the future.   Increase your water intake. Add fresh fruits or cucumber to flavor it.   This is a list of the screening recommended for you and due dates:  Health Maintenance  Topic Date Due  .  Hepatitis C: One time screening is recommended by Center for Disease Control  (CDC) for  adults born from [birth year] through 1965.   November 09, 1945  . Complete foot exam   03/11/1956  . Tetanus Vaccine  03/11/1965  . Colon Cancer Screening  03/11/1996  . Pneumonia vaccines (1 of 2 - PCV13) 03/12/2011  . Hemoglobin A1C  12/10/2017  . Eye exam for diabetics  03/10/2018  . Flu Shot  Completed

## 2017-06-17 NOTE — Progress Notes (Addendum)
Subjective:   John [name] is a 72 y.o. male who presents for an Initial Medicare Annual Wellness Visit. John [name] is retired and lives at home with his girlfriend of 33 years. He has 2 adult daughters and 3 adult sons and 4 grandkids.   Review of Systems  Reports that health is worse than last year due to cancer diagnosis and fatigue from treatment.   Cardiac Risk Factors include: advanced age (>105men, >19 women);diabetes mellitus;dyslipidemia;male gender;hypertension;sedentary lifestyle;obesity (BMI >30kg/m2)  Skin: mole on left side of forehead that has darkened recently.   Objective:    Today's Vitals   06/17/17 1419  BP: 129/81  Pulse: (!) 111  Weight: 185 lb (83.9 kg)  Height: 5' 4.5" (1.638 m)   Body mass index is 31.26 kg/m.  Advanced Directives 06/17/2017 06/05/2017 06/05/2017 05/22/2017 05/08/2017 04/24/2017 04/10/2017  Does Patient Have a Medical Advance Directive? No No No No No No No  Would patient like information on creating a medical advance directive? Yes (MAU/Ambulatory/Procedural Areas - Information given) No - Patient declined No - Patient declined No - Patient declined No - Patient declined No - Patient declined No - Patient declined    Current Medications (verified) Outpatient Encounter Medications as of 06/17/2017  Medication Sig  . acetaminophen (TYLENOL) 325 MG tablet Take 2 tablets (650 mg total) by mouth every 6 (six) hours as needed for mild pain (or Fever >/= 101).  Marland Kitchen albuterol (PROVENTIL HFA;VENTOLIN HFA) 108 (90 Base) MCG/ACT inhaler Inhale 2 puffs into the lungs every 6 (six) hours as needed for wheezing or shortness of breath.  Marland Kitchen aspirin EC 81 MG tablet Take 81 mg by mouth daily.  . cloNIDine (CATAPRES) 0.1 MG tablet Take 1 tablet (0.1 mg total) by mouth 3 (three) times daily.  [place] (IMFINZI IV) Inject into the vein. Every 2 weeks  . dutasteride (AVODART) 0.5 MG capsule Take 1 capsule (0.5 mg total) by mouth daily.  . finasteride (PROSCAR) 5  MG tablet TAKE 1 TABLET BY MOUTH EVERY DAY  . fluticasone (FLONASE) 50 MCG/ACT nasal spray Place 2 sprays into both nostrils daily.  . Fluticasone-Salmeterol (ADVAIR DISKUS IN) Inhale 1 puff into the lungs 2 (two) times daily.  Marland Kitchen gabapentin (NEURONTIN) 300 MG capsule Take 1 capsule in Am and 2 at night  . HYDROcodone-acetaminophen (NORCO/VICODIN) 5-325 MG tablet Take 2 tablets by mouth every 4 (four) hours as needed.  Marland Kitchen ibuprofen (ADVIL,MOTRIN) 600 MG tablet Take 1 tablet (600 mg total) by mouth every 6 (six) hours as needed.  . INCRUSE ELLIPTA 62.5 MCG/INH AEPB TAKE 1 PUFF BY MOUTH EVERY DAY  . KLOR-CON M20 20 MEQ tablet TAKE 1 TABLET BY MOUTH EVERY DAY  . levothyroxine (SYNTHROID, LEVOTHROID) 25 MCG tablet Take 1 tablet (25 mcg total) by mouth daily before breakfast.  . lisinopril-hydrochlorothiazide (PRINZIDE,ZESTORETIC) 20-25 MG tablet Take 1 tablet by mouth daily.  Marland Kitchen LORazepam (ATIVAN) 0.5 MG tablet TAKE 1 TABLET BY MOUTH TWICE A DAY AS NEEDED  . magic mouthwash w/lidocaine SOLN USE 1 TEASPOONFUL BY 15MINS BEFORE MEALS AND AS NEEDED FOR DISCOMFORT,SWISH AND SWALLOW  . meloxicam (MOBIC) 7.5 MG tablet Take 1 tablet (7.5 mg total) by mouth daily as needed for pain (back pain).  Marland Kitchen omeprazole (PRILOSEC) 20 MG capsule Take 20 mg by mouth daily.   . ondansetron (ZOFRAN) 8 MG tablet Take 1 tablet (8 mg total) by mouth every 8 (eight) hours as needed for nausea or vomiting.  . pravastatin (PRAVACHOL) 40 MG tablet  Take 40 mg by mouth every 3 (three) days. M-W-F  . prochlorperazine (COMPAZINE) 10 MG tablet Take 1 tablet (10 mg total) by mouth every 6 (six) hours as needed for nausea or vomiting.  . tamsulosin (FLOMAX) 0.4 MG CAPS capsule TAKE 2 CAPSULES (0.8 MG TOTAL) BY MOUTH DAILY.  Marland Kitchen TRADJENTA 5 MG TABS tablet TAKE 1 TABLET BY MOUTH EVERY DAY  . traZODone (DESYREL) 50 MG tablet Take 1 tablet (50 mg total) by mouth at bedtime as needed for sleep.  . varenicline (CHANTIX STARTING MONTH PAK) 0.5 MG X  11 & 1 MG X 42 tablet Take one 0.5 mg tablet by mouth once daily for 3 days, then increase to one 0.5 mg tablet twice daily for 4 days, then increase to one 1 mg tablet twice daily.   No facility-administered encounter medications on file as of 06/17/2017.     Allergies (verified) Patient has no known allergies.   History: Past Medical History:  Diagnosis Date  . Adenocarcinoma of lung, right ([place]) 04/18/2016  . Anxiety   . Arthritis   . CHF (congestive heart failure) ([facility])   . COPD (chronic obstructive pulmonary disease) ([facility])   . Depression   . Diabetes mellitus without complication ([facility])    no meds  . DM (diabetes mellitus) ([place]) 07/09/2016  . Dyspnea   . History of kidney stones   . Hyperlipidemia   . Hypertension   . Macular degeneration   . Neuropathy   . Non-small cell lung cancer, right ([facility]) 04/18/2016  . Prostatitis   . Pulmonary nodule, left 07/16/2016  . Sleep apnea    cpap   Past Surgical History:  Procedure Laterality Date  . CATARACT EXTRACTION, BILATERAL Bilateral   . NO PAST SURGERIES    . PORTACATH PLACEMENT Left 06/13/2016   Procedure: INSERTION PORT-A-CATH;  Surgeon: Aviva Signs, MD;  Location: [facility];  Service: General;  Laterality: Left;  Marland Kitchen VIDEO BRONCHOSCOPY WITH ENDOBRONCHIAL NAVIGATION N/A 05/28/2016   Procedure: VIDEO BRONCHOSCOPY WITH ENDOBRONCHIAL NAVIGATION;  Surgeon: [name], MD;  Location: [facility];  Service: Thoracic;  Laterality: N/A;  . VIDEO BRONCHOSCOPY WITH ENDOBRONCHIAL ULTRASOUND N/A 05/28/2016   Procedure: VIDEO BRONCHOSCOPY WITH ENDOBRONCHIAL ULTRASOUND;  Surgeon: [name], MD;  Location: [facility];  Service: Thoracic;  Laterality: N/A;   Family History  Problem Relation Age of Onset  . Hypertension Mother   . Diabetes Father   . Heart disease Father   . Stroke Father   . Hypertension Sister    Social History   Socioeconomic History  . Marital status: Divorced    Spouse name: Not on file  . Number of children: 5   . Years of education: 10  . Highest education level: 10th grade  Social Needs  . Financial resource strain: Not very hard  . Food insecurity - worry: Never true  . Food insecurity - inability: Never true  . Transportation needs - medical: No  . Transportation needs - non-medical: No  Occupational History  . Occupation: Retired  Tobacco Use  . Smoking status: Current Every Day Smoker    Packs/day: 0.10    Years: 55.00    Pack years: 5.50    Types: Cigarettes    Start date: 03/11/1961  . Smokeless tobacco: Never Used  . Tobacco comment: smoking 1 to 2 cigarettes per day  Substance and Sexual Activity  . Alcohol use: No  . Drug use: No  . Sexual activity: Yes    Birth control/protection:  None  Other Topics Concern  . Not on file  Social History Narrative  . Not on file   Tobacco Counseling Ready to quit: Not Answered Counseling given: Not Answered Comment: smoking 1 to 2 cigarettes per day Patient is working on decreasing the amount of cigarettes that he smokes each day  Clinical Intake:   Pain : No/denies pain     Nutritional Status: BMI > 30  Obese Diabetes: No  How often do you need to have someone help you when you read instructions, pamphlets, or other written materials from your doctor or pharmacy?: 1 - Never What is the last grade level you completed in school?: 10  Interpreter Needed?: No  Information entered by :: [name], RN  Activities of Daily Living In your present state of health, do you have any difficulty performing the following activities: 06/17/2017 08/05/2016  Hearing? N N  Vision? N N  Difficulty concentrating or making decisions? N N  Comment Has some difficulty recalling names at times -  Walking or climbing stairs? N Y  Dressing or bathing? N Y  Doing errands, shopping? N N  Preparing Food and eating ? N -  Using the Toilet? N -  In the past six months, have you accidently leaked urine? N -  Do you have problems with loss of  bowel control? N -  Managing your Medications? N -  Managing your Finances? N -  Housekeeping or managing your Housekeeping? N -  Some recent data might be hidden     Immunizations and Health Maintenance Immunization History  Administered Date(s) Administered  . Influenza, High Dose Seasonal PF 12/28/2015  . Influenza,inj,Quad PF,6+ Mos 01/23/2017  . Pneumococcal Conjugate-13 06/17/2017   Health Maintenance Due  Topic Date Due  . Hepatitis C Screening  January 06, 1946  . FOOT EXAM  03/11/1956  . TETANUS/TDAP  03/11/1965  . COLONOSCOPY  03/11/1996  . PNA vac Low Risk Adult (1 of 2 - PCV13) 03/12/2011    Patient Care Team: [name], MD as PCP - General (Family Medicine) Herminio Commons, MD as Attending Physician (Cardiology) Derek Jack, MD as Consulting Physician (Hematology)  ED to hospital admission in 08/2016 for arthritis pain. He was diagnosed with lung cancer last year and has been going to [facility] for treatments but has not been inpatient and has not had surgery.   Assessment:   This is a routine wellness examination for Danel.  Hearing/Vision screen No deficits noted during visit.   Dietary issues and exercise activities discussed: Current Exercise Habits: The patient does not participate in regular exercise at present, Exercise limited by: Other - see comments(weak from cancer treatment)  Goals Increase water intake Eat 3 meals a day. Low glycemic fruits, vegetables, lean proteins  Depression Screen PHQ 2/9 Scores 06/17/2017 06/09/2017 09/08/2016 09/08/2016  PHQ - 2 Score 2 3 0 0  PHQ- 9 Score 7 8 - -    Fall Risk Fall Risk  06/17/2017 06/09/2017 09/08/2016 09/08/2016 07/14/2016  Falls in the past year? No No No No No  Number falls in past yr: - - - - -  Injury with Fall? - - - - -    Cognitive Function: MMSE - Mini Mental State Exam 06/17/2017  Orientation to time 5  Orientation to Place 4  Registration 3  Attention/ Calculation 5  Recall 3    Language- name 2 objects 2  Language- repeat 1  Language- follow 3 step command 3  Language- read &  follow direction 1  Write a sentence 1  Copy design 0  Total score 28        Screening Tests Health Maintenance  Topic Date Due  . Hepatitis C Screening  1945/07/27  . FOOT EXAM  03/11/1956  . TETANUS/TDAP  03/11/1965  . COLONOSCOPY  03/11/1996  . PNA vac Low Risk Adult (1 of 2 - PCV13) 03/12/2011  . HEMOGLOBIN A1C  12/10/2017  . OPHTHALMOLOGY EXAM  03/10/2018  . INFLUENZA VACCINE  Completed        Plan:    Prevnar vaccine given today Keep f/u with PCP and specialists Increase water intake Advanced directives given and explained. He is interested in assigning durable power of attorney to his long time girlfriend. He will bring a copy of the papers back to our office to put on file.   I have personally reviewed and noted the following in the patient's chart:   . Medical and social history . Use of alcohol, tobacco or illicit drugs  . Current medications and supplements . Functional ability and status . Nutritional status . Physical activity . Advanced directives . List of other physicians . Hospitalizations, surgeries, and ER visits in previous 12 months . Vitals . Screenings to include cognitive, depression, and falls . Referrals and appointments  In addition, I have reviewed and discussed with patient certain preventive protocols, quality metrics, and best practice recommendations. A written personalized care plan for preventive services as well as general preventive health recommendations were provided to patient.     [name], RN   06/17/2017   I have reviewed and agree with the above AWV documentation.   Terald Sleeper PA-C [facility] [address]  [place], [place] [address] [phone]

## 2017-06-19 ENCOUNTER — Ambulatory Visit (HOSPITAL_COMMUNITY): Payer: Medicare Other

## 2017-06-19 ENCOUNTER — Other Ambulatory Visit: Payer: Self-pay

## 2017-06-19 ENCOUNTER — Encounter (HOSPITAL_COMMUNITY): Payer: Self-pay

## 2017-06-19 ENCOUNTER — Inpatient Hospital Stay (HOSPITAL_COMMUNITY): Payer: Medicare Other

## 2017-06-19 VITALS — BP 119/59 | HR 92 | Temp 97.6°F | Resp 18 | Wt 187.0 lb

## 2017-06-19 DIAGNOSIS — R000 Tachycardia, unspecified: Secondary | ICD-10-CM | POA: Diagnosis not present

## 2017-06-19 DIAGNOSIS — Z5112 Encounter for antineoplastic immunotherapy: Secondary | ICD-10-CM | POA: Diagnosis not present

## 2017-06-19 DIAGNOSIS — E039 Hypothyroidism, unspecified: Secondary | ICD-10-CM | POA: Diagnosis not present

## 2017-06-19 DIAGNOSIS — C3411 Malignant neoplasm of upper lobe, right bronchus or lung: Secondary | ICD-10-CM | POA: Diagnosis not present

## 2017-06-19 DIAGNOSIS — E1140 Type 2 diabetes mellitus with diabetic neuropathy, unspecified: Secondary | ICD-10-CM | POA: Diagnosis not present

## 2017-06-19 DIAGNOSIS — C3491 Malignant neoplasm of unspecified part of right bronchus or lung: Secondary | ICD-10-CM

## 2017-06-19 LAB — CBC WITH DIFFERENTIAL/PLATELET
Basophils Absolute: 0 10*3/uL (ref 0.0–0.1)
Basophils Relative: 1 %
EOS PCT: 2 %
Eosinophils Absolute: 0.1 10*3/uL (ref 0.0–0.7)
HEMATOCRIT: 37.6 % — AB (ref 39.0–52.0)
Hemoglobin: 12.2 g/dL — ABNORMAL LOW (ref 13.0–17.0)
LYMPHS PCT: 10 %
Lymphs Abs: 0.6 10*3/uL — ABNORMAL LOW (ref 0.7–4.0)
MCH: 32 pg (ref 26.0–34.0)
MCHC: 32.4 g/dL (ref 30.0–36.0)
MCV: 98.7 fL (ref 78.0–100.0)
MONOS PCT: 15 %
Monocytes Absolute: 0.9 10*3/uL (ref 0.1–1.0)
NEUTROS ABS: 4.1 10*3/uL (ref 1.7–7.7)
Neutrophils Relative %: 72 %
PLATELETS: 283 10*3/uL (ref 150–400)
RBC: 3.81 MIL/uL — ABNORMAL LOW (ref 4.22–5.81)
RDW: 14.4 % (ref 11.5–15.5)
WBC: 5.8 10*3/uL (ref 4.0–10.5)

## 2017-06-19 LAB — COMPREHENSIVE METABOLIC PANEL
ALK PHOS: 78 U/L (ref 38–126)
ALT: 14 U/L — ABNORMAL LOW (ref 17–63)
ANION GAP: 10 (ref 5–15)
AST: 18 U/L (ref 15–41)
Albumin: 3.5 g/dL (ref 3.5–5.0)
BILIRUBIN TOTAL: 0.5 mg/dL (ref 0.3–1.2)
BUN: 19 mg/dL (ref 6–20)
CALCIUM: 8.8 mg/dL — AB (ref 8.9–10.3)
CO2: 28 mmol/L (ref 22–32)
Chloride: 100 mmol/L — ABNORMAL LOW (ref 101–111)
Creatinine, Ser: 0.88 mg/dL (ref 0.61–1.24)
GFR calc Af Amer: >60 mL/min (ref >60)
GFR calc non Af Amer: >60 mL/min (ref >60)
GLUCOSE: 149 mg/dL — AB (ref 65–99)
POTASSIUM: 3.4 mmol/L — AB (ref 3.5–5.1)
SODIUM: 138 mmol/L (ref 135–145)
TOTAL PROTEIN: 6.9 g/dL (ref 6.5–8.1)

## 2017-06-19 LAB — LACTATE DEHYDROGENASE: LDH: 128 U/L (ref 98–192)

## 2017-06-19 MED ORDER — SODIUM CHLORIDE 0.9 % IV SOLN
9.5000 mg/kg | Freq: Once | INTRAVENOUS | Status: AC
  Administered 2017-06-19: 740 mg via INTRAVENOUS
  Filled 2017-06-19: qty 4.8

## 2017-06-19 MED ORDER — SODIUM CHLORIDE 0.9 % IV SOLN
Freq: Once | INTRAVENOUS | Status: AC
  Administered 2017-06-19: 11:00:00 via INTRAVENOUS

## 2017-06-19 MED ORDER — HEPARIN SOD (PORK) LOCK FLUSH 100 UNIT/ML IV SOLN
500.0000 [IU] | Freq: Once | INTRAVENOUS | Status: AC | PRN
  Administered 2017-06-19: 500 [IU]

## 2017-06-19 MED ORDER — SODIUM CHLORIDE 0.9% FLUSH
10.0000 mL | INTRAVENOUS | Status: DC | PRN
  Administered 2017-06-19: 10 mL
  Filled 2017-06-19: qty 10

## 2017-06-19 NOTE — Treatment Plan (Signed)
Ok to leave dose at 740 mg per Dr Walden Field.  Pt weight today is 84.8 kg.

## 2017-06-19 NOTE — Patient Instructions (Signed)
[facility] Discharge Instructions for Patients Receiving Chemotherapy   Beginning January 23rd 2017 lab work for the Cancer Center will be done in the  Main lab at [facility] on 1st floor. If you have a lab appointment with the Cancer Center please come in thru the  Main Entrance and check in at the main information desk   Today you received the following chemotherapy agents   To help prevent nausea and vomiting after your treatment, we encourage you to take your nausea medication     If you develop nausea and vomiting, or diarrhea that is not controlled by your medication, call the clinic.  The clinic phone number is [phone]. Office hours are Monday-Friday 8:30am-5:00pm.  BELOW ARE SYMPTOMS THAT SHOULD BE REPORTED IMMEDIATELY:  *FEVER GREATER THAN 101.0 F  *CHILLS WITH OR WITHOUT FEVER  NAUSEA AND VOMITING THAT IS NOT CONTROLLED WITH YOUR NAUSEA MEDICATION  *UNUSUAL SHORTNESS OF BREATH  *UNUSUAL BRUISING OR BLEEDING  TENDERNESS IN MOUTH AND THROAT WITH OR WITHOUT PRESENCE OF ULCERS  *URINARY PROBLEMS  *BOWEL PROBLEMS  UNUSUAL RASH Items with * indicate a potential emergency and should be followed up as soon as possible. If you have an emergency after office hours please contact your primary care physician or go to the nearest emergency department.  Please call the clinic during office hours if you have any questions or concerns.   You may also contact the Patient Navigator at [phone] should you have any questions or need assistance in obtaining follow up care.      Resources For Cancer Patients and their Caregivers ? American Cancer Society: Can assist with transportation, wigs, general needs, runs Look Good Feel Better.        1-888-227-6333 ? Cancer Care: Provides financial assistance, online support groups, medication/co-pay assistance.  1-800-813-HOPE (4673) ? [facility] Assists [place] cancer  patients and their families through emotional , educational and financial support.  336-427-4357 ? [place] DSS Where to apply for food stamps, Medicaid and utility assistance. 336-342-1394 ? RCATS: Transportation to medical appointments. 336-347-2287 ? Social Security Administration: May apply for disability if have a Stage IV cancer. 336-342-7796 1-800-772-1213 ? [place] Aging, Disability and Transit Services: Assists with nutrition, care and transit needs. 336-349-2343         

## 2017-06-19 NOTE — Progress Notes (Signed)
Labs reviewed by Dr. Walden Field. Will proceed with treatment.  Treatment given per orders. Patient tolerated it well without problems. Vitals stable and discharged home from clinic ambulatory. Follow up as scheduled.

## 2017-06-22 ENCOUNTER — Telehealth: Payer: Self-pay | Admitting: Cardiovascular Disease

## 2017-06-22 ENCOUNTER — Ambulatory Visit (INDEPENDENT_AMBULATORY_CARE_PROVIDER_SITE_OTHER): Payer: Medicare Other | Admitting: Cardiovascular Disease

## 2017-06-22 ENCOUNTER — Encounter: Payer: Self-pay | Admitting: Cardiovascular Disease

## 2017-06-22 VITALS — BP 138/64 | HR 109 | Ht 66.5 in | Wt 184.0 lb

## 2017-06-22 DIAGNOSIS — I3139 Other pericardial effusion (noninflammatory): Secondary | ICD-10-CM

## 2017-06-22 DIAGNOSIS — E7800 Pure hypercholesterolemia, unspecified: Secondary | ICD-10-CM | POA: Diagnosis not present

## 2017-06-22 DIAGNOSIS — I313 Pericardial effusion (noninflammatory): Secondary | ICD-10-CM

## 2017-06-22 DIAGNOSIS — C3491 Malignant neoplasm of unspecified part of right bronchus or lung: Secondary | ICD-10-CM | POA: Diagnosis not present

## 2017-06-22 DIAGNOSIS — R0609 Other forms of dyspnea: Secondary | ICD-10-CM | POA: Diagnosis not present

## 2017-06-22 DIAGNOSIS — I10 Essential (primary) hypertension: Secondary | ICD-10-CM | POA: Diagnosis not present

## 2017-06-22 DIAGNOSIS — R079 Chest pain, unspecified: Secondary | ICD-10-CM | POA: Diagnosis not present

## 2017-06-22 DIAGNOSIS — Z720 Tobacco use: Secondary | ICD-10-CM

## 2017-06-22 MED ORDER — METOPROLOL TARTRATE 25 MG PO TABS: 25.0000 mg | ORAL_TABLET | Freq: Two times a day (BID) | ORAL | 6 refills | Status: DC

## 2017-06-22 NOTE — Patient Instructions (Signed)
Medication Instructions:   Begin Lopressor 25mg  twice a day.  Continue all other medications.    Labwork: none  Testing/Procedures:  Your physician has requested that you have an echocardiogram. Echocardiography is a painless test that uses sound waves to create images of your heart. It provides your doctor with information about the size and shape of your heart and how well your heart's chambers and valves are working. This procedure takes approximately one hour. There are no restrictions for this procedure. (limited)  Office will contact with results via phone or letter.    Follow-Up: 3 months   Any Other Special Instructions Will Be Listed Below (If Applicable).  If you need a refill on your cardiac medications before your next appointment, please call your pharmacy.

## 2017-06-22 NOTE — Telephone Encounter (Signed)
Limited echo - pericardial effusion  Scheduled at [facility] April 4,2019

## 2017-06-22 NOTE — Progress Notes (Signed)
SUBJECTIVE: The patient presents for routine follow-up.  He has been experience some shortness of breath which began after starting chemotherapy infusion.  He called our office in mid February to discuss this.  Most recent echocardiogram performed on 12/25/16 demonstrated normal left ventricular systolic function and regional wall motion, LVEF 55-60%.  There was a trivial pericardial effusion.  There had been a small to moderate pericardial effusion in June 2018.  He has a history of chest pain, exertional dyspnea, and malignant hypertension.  He has non-small cell carcinoma of the right lung.  Hemoglobin on 06/19/17 was mildly decreased, 12.2.  He has shortness of breath walking to his mailbox or taking the trash cans to the end of the driveway.  He smokes about 4 cigarettes daily.  He uses Umeclidinium bromide for COPD.  I personally reviewed an ECG performed in the office today which demonstrated sinus tachycardia, 105 bpm.  Review of Systems: As per "subjective", otherwise negative.  No Known Allergies  Current Outpatient Medications  Medication Sig Dispense Refill  . acetaminophen (TYLENOL) 325 MG tablet Take 2 tablets (650 mg total) by mouth every 6 (six) hours as needed for mild pain (or Fever >/= 101).    Marland Kitchen albuterol (PROVENTIL HFA;VENTOLIN HFA) 108 (90 Base) MCG/ACT inhaler Inhale 2 puffs into the lungs every 6 (six) hours as needed for wheezing or shortness of breath. 1 Inhaler 2  . aspirin EC 81 MG tablet Take 81 mg by mouth daily.    . cloNIDine (CATAPRES) 0.1 MG tablet Take 1 tablet (0.1 mg total) by mouth 3 (three) times daily. 90 tablet 11  . Durvalumab (IMFINZI IV) Inject into the vein. Every 2 weeks    . dutasteride (AVODART) 0.5 MG capsule Take 1 capsule (0.5 mg total) by mouth daily. 90 capsule 3  . finasteride (PROSCAR) 5 MG tablet TAKE 1 TABLET BY MOUTH EVERY DAY 30 tablet 0  . fluticasone (FLONASE) 50 MCG/ACT nasal spray Place 2 sprays into both nostrils daily.  16 g 6  . Fluticasone-Salmeterol (ADVAIR DISKUS IN) Inhale 1 puff into the lungs 2 (two) times daily.    Marland Kitchen gabapentin (NEURONTIN) 300 MG capsule Take 1 capsule in Am and 2 at night 270 capsule 3  . HYDROcodone-acetaminophen (NORCO/VICODIN) 5-325 MG tablet Take 2 tablets by mouth every 4 (four) hours as needed. 6 tablet 0  . ibuprofen (ADVIL,MOTRIN) 600 MG tablet Take 1 tablet (600 mg total) by mouth every 6 (six) hours as needed. 30 tablet 0  . INCRUSE ELLIPTA 62.5 MCG/INH AEPB TAKE 1 PUFF BY MOUTH EVERY DAY 90 each 0  . KLOR-CON M20 20 MEQ tablet TAKE 1 TABLET BY MOUTH EVERY DAY 30 tablet 0  . levothyroxine (SYNTHROID, LEVOTHROID) 25 MCG tablet Take 1 tablet (25 mcg total) by mouth daily before breakfast. 30 tablet 1  . lisinopril-hydrochlorothiazide (PRINZIDE,ZESTORETIC) 20-25 MG tablet Take 1 tablet by mouth daily. 90 tablet 3  . LORazepam (ATIVAN) 0.5 MG tablet TAKE 1 TABLET BY MOUTH TWICE A DAY AS NEEDED 30 tablet 1  . magic mouthwash w/lidocaine SOLN USE 1 TEASPOONFUL BY 15MINS BEFORE MEALS AND AS NEEDED FOR DISCOMFORT,SWISH AND SWALLOW  1  . meloxicam (MOBIC) 7.5 MG tablet Take 1 tablet (7.5 mg total) by mouth daily as needed for pain (back pain). 30 tablet 1  . omeprazole (PRILOSEC) 20 MG capsule Take 20 mg by mouth daily.     . ondansetron (ZOFRAN) 8 MG tablet Take 1 tablet (8 mg total) by  mouth every 8 (eight) hours as needed for nausea or vomiting. 60 tablet 1  . pravastatin (PRAVACHOL) 40 MG tablet Take 40 mg by mouth every 3 (three) days. M-W-F    . prochlorperazine (COMPAZINE) 10 MG tablet Take 1 tablet (10 mg total) by mouth every 6 (six) hours as needed for nausea or vomiting. 60 tablet 2  . tamsulosin (FLOMAX) 0.4 MG CAPS capsule TAKE 2 CAPSULES (0.8 MG TOTAL) BY MOUTH DAILY. 180 capsule 3  . TRADJENTA 5 MG TABS tablet TAKE 1 TABLET BY MOUTH EVERY DAY 30 tablet 5  . traZODone (DESYREL) 50 MG tablet Take 1 tablet (50 mg total) by mouth at bedtime as needed for sleep. 30 tablet 0  .  varenicline (CHANTIX STARTING MONTH PAK) 0.5 MG X 11 & 1 MG X 42 tablet Take one 0.5 mg tablet by mouth once daily for 3 days, then increase to one 0.5 mg tablet twice daily for 4 days, then increase to one 1 mg tablet twice daily. 53 tablet 0   No current facility-administered medications for this visit.     Past Medical History:  Diagnosis Date  . Adenocarcinoma of lung, right ([place]) 04/18/2016  . Anxiety   . Arthritis   . CHF (congestive heart failure) ([place])   . COPD (chronic obstructive pulmonary disease) ([place])   . Depression   . Diabetes mellitus without complication ([facility])    no meds  . DM (diabetes mellitus) ([place]) 07/09/2016  . Dyspnea   . History of kidney stones   . Hyperlipidemia   . Hypertension   . Macular degeneration   . Neuropathy   . Non-small cell lung cancer, right ([place]) 04/18/2016  . Prostatitis   . Pulmonary nodule, left 07/16/2016  . Sleep apnea    cpap    Past Surgical History:  Procedure Laterality Date  . CATARACT EXTRACTION, BILATERAL Bilateral   . NO PAST SURGERIES    . PORTACATH PLACEMENT Left 06/13/2016   Procedure: INSERTION PORT-A-CATH;  Surgeon: Aviva Signs, MD;  Location: [facility];  Service: General;  Laterality: Left;  Marland Kitchen VIDEO BRONCHOSCOPY WITH ENDOBRONCHIAL NAVIGATION N/A 05/28/2016   Procedure: VIDEO BRONCHOSCOPY WITH ENDOBRONCHIAL NAVIGATION;  Surgeon: [name], MD;  Location: [place];  Service: Thoracic;  Laterality: N/A;  . VIDEO BRONCHOSCOPY WITH ENDOBRONCHIAL ULTRASOUND N/A 05/28/2016   Procedure: VIDEO BRONCHOSCOPY WITH ENDOBRONCHIAL ULTRASOUND;  Surgeon: [name], MD;  Location: [place];  Service: Thoracic;  Laterality: N/A;    Social History   Socioeconomic History  . Marital status: Divorced    Spouse name: Not on file  . Number of children: 5  . Years of education: 10  . Highest education level: 10th grade  Social Needs  . Financial resource strain: Not very hard  . Food insecurity - worry: Never true  . Food  insecurity - inability: Never true  . Transportation needs - medical: No  . Transportation needs - non-medical: No  Occupational History  . Occupation: Retired  Tobacco Use  . Smoking status: Current Every Day Smoker    Packs/day: 0.10    Years: 55.00    Pack years: 5.50    Types: Cigarettes    Start date: 03/11/1961  . Smokeless tobacco: Never Used  . Tobacco comment: smoking 1 to 2 cigarettes per day  Substance and Sexual Activity  . Alcohol use: No  . Drug use: No  . Sexual activity: Yes    Birth control/protection: None  Other Topics Concern  . Not  on file  Social History Narrative  . Not on file     Vitals:   06/22/17 1414  BP: 138/64  Pulse: (!) 109  SpO2: 96%  Weight: 184 lb (83.5 kg)  Height: 5' 6.5" (1.689 m)    Wt Readings from Last 3 Encounters:  06/22/17 184 lb (83.5 kg)  06/19/17 187 lb (84.8 kg)  06/17/17 185 lb (83.9 kg)     PHYSICAL EXAM General: NAD HEENT: Normal. Neck: No JVD, no thyromegaly. Lungs: Diffusely poor air movement, inspiratory wheezes, no crackles. CV: Tachycardic, regular rhythm, normal S1/S2, no S3/S4, no murmur. No pretibial or periankle edema.  Abdomen: Soft, nontender, no distention.  Neurologic: Alert and oriented.  Psych: Normal affect. Skin: Normal. Musculoskeletal: No gross deformities.    ECG: Most recent ECG reviewed.   Labs: Lab Results  Component Value Date/Time   K 3.4 (L) 06/19/2017 09:33 AM   BUN 19 06/19/2017 09:33 AM   BUN 15 08/28/2016 11:17 AM   CREATININE 0.88 06/19/2017 09:33 AM   ALT 14 (L) 06/19/2017 09:33 AM   TSH 3.616 05/22/2017 10:43 AM   HGB 12.2 (L) 06/19/2017 09:33 AM   HGB 9.6 (L) 08/28/2016 11:17 AM     Lipids: Lab Results  Component Value Date/Time   LDLCALC 107 (H) 02/01/2016 10:07 AM   CHOL 169 02/01/2016 10:07 AM   TRIG 159 (H) 02/01/2016 10:07 AM   HDL 30 (L) 02/01/2016 10:07 AM       ASSESSMENT AND PLAN:  1. Chest pain: Lexiscan Myoview stress test was low risk with  small area of inferior ischemia. Will manage medically. Continue ASA and statin.  He is no longer on a beta-blocker.  I will start metoprolol 25 mg twice daily. Cardiovascular risk factors include hypertension, hyperlipidemia, and tobacco abuse. I will also monitor his pericardial effusion.  2. Malignant hypertension: Controlled. No changes.  3. Hyperlipidemia: On pravastatin.  4. Tobacco abuse disorder: He smokes 3-4 cigarettes daily.  5. Pericardial effusion: Will repeat a limited echo to assess pericardial effusion.  6. Non small cell lung CA of right lung: Being treated with chemotherapy and radiation.  7.  Exertional dyspnea: This is likely related to advanced COPD.  I will obtain a limited echocardiogram to make certain he is not redeveloped a pericardial effusion.  He is also tachycardic.  I will initiate metoprolol tartrate 25 mg twice daily to see if this helps alleviate his symptoms.  He had been on metoprolol succinate 50 mg daily about a year ago.  He is not certain why it was discontinued.   Disposition: Follow up 3 months   [name], M.D., F.A.C.C.

## 2017-06-28 NOTE — Progress Notes (Signed)
Diagnosis Non-small cell lung cancer, right ([place]) - Plan: DG Abd 1 View, CBC with Differential/Platelet, Comprehensive metabolic panel, Lactate dehydrogenase  Staging Cancer Staging Non-small cell lung cancer, right [facility]) Staging form: Lung, AJCC 8th Edition - Clinical stage from 06/05/2016: Stage IIIA (cT1b(2), cN2, cM0) - Signed by Baird Cancer, PA-C on 08/21/2016   Assessment and Plan:  1.  Stage IIB/IIIA NSCLC (T1b, N1-2, M0) right lung: 72 year old male s/p concurrent chemoradiation x 6 cycles of weekly Carbo/Taxol; which he completed on 08/15/16.  Restaging PET scan in 09/2016 showed partial metabolic response to treatment and consolidative/maintenance therapy with Imfinzi started on 09/26/16.   Pt is here for C18  Imfinzi.  Labs adequate for therapy today.  He will continue Imfinzi every 2 weeks as scheduled. Will continue to monitor TSH.  Last scans done 04/2017 showed stable findings.  He will be set up for repeat imaging in 07/2017.   2.  Smoking.   Patient did not tolerate chantix. He had severe nausea/vomiting. Cessation recommended.    3.  DM.  Continue to follow-up with PCP.    4. Abdominal distension.  KUB done today 06/05/2017 shows no acute findings.  Normal bowel gas pattern.  Pt should try Tums prn for symptomatic therapy.      Interval History:  Stage IIB/IIIA NSCLC (T1b, N1-2, M0) right lung: 72 year old male s/p concurrent chemoradiation x 6 cycles of weekly Carbo/Taxol; which he completed on 08/15/16.  Restaging PET scan in 09/2016 showed partial metabolic response to treatment and consolidative/maintenance therapy with Imfinzi started on 09/26/16.   Pt is on Imfinzi every 2 weeks.  Last scans done 04/2017 showed stable findings.    Current Status:  Pt is seen today for follow-up.  He is here today for evaluation prior to [place].  He reported some minor abdominal distension.      Non-small cell lung cancer, right ([place])   04/17/2016 Imaging    CT chest- 1. Examination is  positive for bilateral upper lobe pulmonary lesions suspicious for malignancy. Further investigation with PET-CT and tissue sampling is advised. 2. Borderline enlarged paratracheal lymph nodes and right hilar nodes. Cannot rule out metastatic adenopathy. Attention to these areas on PET-CT is advised. 3. Emphysema 4. Aortic atherosclerosis and multi vessel coronary artery calcification.      04/18/2016 Initial Diagnosis    Adenocarcinoma of lung, right ([place])      04/24/2016 PET scan    1. Adjacent hypermetabolic nodules in the RIGHT upper lobe consistent bronchogenic carcinoma. 2. Suspicion of ipsilateral hilar and paratracheal nodal metastasis. 3. Nodule in the LEFT upper lobe with suspicious morphology has a relatively low metabolic activity for size. Favor synchronous primary bronchogenic carcinoma. 4. No metabolic activity associated with a small LEFT lower lobe pulmonary nodule. Recommend attention on follow-up.      05/06/2016 Imaging    MRI brain- Negative for metastatic disease.      05/28/2016 Procedure    1. Electromagnetic navigational bronchoscopy with brushings, needle     aspirations and biopsies of bilateral upper lobe nodules. 2. Endobronchial ultrasound with mediastinal and hilar lymph node     aspirations. By Dr. [name]      06/03/2016 Pathology Results    Diagnosis 1. Lung, biopsy, Right upper lobe - BLOOD AND BENIGN BRONCHIAL AND LUNG TISSUE. 2. Lung, biopsy, Left upper lobe - BLOOD AND BENIGN BRONCHIAL AND LUNG TISSUE.      06/03/2016 Pathology Results    Diagnosis TRANSBRONCHIAL NEEDLE ASPIRATION,  NAVIGATION, RUL (SPECIMEN 5 OF 7, COLLECTED 05/28/16): MALIGNANT CELLS CONSISTENT WITH NON-SMALL CELL CARCINOMA Comment There are limited tumor cells in the cell block (insufficient for molecular testing). TTF-1 is positive in a few of the atypical cells. They appear negative for synaptophysin and cytokeratin 5/6. Overall there is limited tumor, but a  lung adenocarcinoma is slightly favored. Dr. [name] has reviewed the case. The case was called to Dr. [name] on 06/03/2016.      06/13/2016 Procedure    Port placed by Dr. Arnoldo Morale      06/25/2016 - 08/15/2016 Chemotherapy    The patient had palonosetron (ALOXI) injection 0.25 mg, 0.25 mg, Intravenous,  Once, 6 of 6 cycles  CARBOplatin (PARAPLATIN) 220 mg in sodium chloride 0.9 % 250 mL chemo infusion, 220 mg (100 % of original dose 215.8 mg), Intravenous,  Once, 6 of 6 cycles Dose modification:   (original dose 215.8 mg, Cycle 1),   (original dose 215.8 mg, Cycle 5),   (original dose 215.8 mg, Cycle 6),   (original dose 215.8 mg, Cycle 2),   (original dose 215.8 mg, Cycle 3),   (original dose 215.8 mg, Cycle 4)  PACLitaxel (TAXOL) 90 mg in dextrose 5 % 250 mL chemo infusion ( for chemotherapy treatment.        06/25/2016 - 08/20/2016 Radiation Therapy    Radiation in [place], [place] by Dr. Quitman Livings.  R lung to 6600 cGy      07/09/2016 - 07/10/2016 Hospital Admission    Admit date: 07/09/2016  Admission diagnosis: Fever and chills Additional comments: Negative work-up, discharged with course of Levaquin      07/09/2016 Treatment Plan Change    Treatment deferred x 1 week due to hospitalization       08/05/2016 - 08/06/2016 Hospital Admission    Admit date: 08/05/2016 Admission diagnosis: Joint pain Additional comments: Treated with steroids with symptomatic improvement      08/25/2016 Imaging    MRI brain- Negative for intracranial metastasis on this motion degraded examination.  Stable mild-to-moderate chronic small vessel ischemic disease.      09/18/2016 PET scan    1. Partial metabolic response. Right upper lobe 2.6 cm hypermetabolic pulmonary nodule, decreased in size and metabolism. Mildly hypermetabolic right hilar nodal metastasis, decreased in metabolism. Right paratracheal lymph node is no longer hypermetabolic. No new or progressive metastatic disease. 2. Apical left upper  lobe 0.9 cm solid pulmonary nodule, slightly decreased in size, with no significant metabolism. The change in size could indicate a neoplastic etiology for this nodule. 3. Additional subcentimeter pulmonary nodules in the left lung are stable and below PET resolution . 4. Additional findings include aortic atherosclerosis, coronary atherosclerosis, moderate emphysema, stable small pericardial effusion/thickening and mild sigmoid diverticulosis.      01/13/2017 Imaging    CT chest with contrast IMPRESSION: 1. No change in size of the hypermetabolic nodular thickening in the RIGHT upper lobe. 2. LEFT upper lobe spiculated nodule is stable. 3. Rounded LEFT lower lobe pulmonary nodule is stable. 4. No evidence of lung cancer progression.      05/08/2017 Adverse Reaction    Development of Hypothyroidism- likely secondary to immunotherapy.  Levothyroxine therapy initiated.       Problem List Patient Active Problem List   Diagnosis Date Noted  . Hypothyroidism [E03.9] 06/09/2017  . Joint pain [M25.50] 08/05/2016  . Bilateral hand swelling [M79.89] 08/05/2016  . Hyponatremia [E87.1] 08/05/2016  . Pulmonary nodule, left [R91.1] 07/16/2016  . CHF (congestive heart failure) ([place]) [I50.9]  07/09/2016  . DM (diabetes mellitus) ([place]) [E11.9] 07/09/2016  . Non-small cell lung cancer, right ([place]) [C34.91] 04/18/2016  . HTN (hypertension) [I10] 02/01/2016  . COPD (chronic obstructive pulmonary disease) ([place]) [J44.9] 02/01/2016  . BPH (benign prostatic hyperplasia) [N40.0] 02/01/2016    Past Medical History Past Medical History:  Diagnosis Date  . Adenocarcinoma of lung, right ([place]) 04/18/2016  . Anxiety   . Arthritis   . CHF (congestive heart failure) ([place])   . COPD (chronic obstructive pulmonary disease) ([place])   . Depression   . Diabetes mellitus without complication ([facility])    no meds  . DM (diabetes mellitus) ([place]) 07/09/2016  . Dyspnea   . History of kidney stones   . Hyperlipidemia    . Hypertension   . Macular degeneration   . Neuropathy   . Non-small cell lung cancer, right ([place]) 04/18/2016  . Prostatitis   . Pulmonary nodule, left 07/16/2016  . Sleep apnea    cpap    Past Surgical History Past Surgical History:  Procedure Laterality Date  . CATARACT EXTRACTION, BILATERAL Bilateral   . NO PAST SURGERIES    . PORTACATH PLACEMENT Left 06/13/2016   Procedure: INSERTION PORT-A-CATH;  Surgeon: Aviva Signs, MD;  Location: [facility];  Service: General;  Laterality: Left;  Marland Kitchen VIDEO BRONCHOSCOPY WITH ENDOBRONCHIAL NAVIGATION N/A 05/28/2016   Procedure: VIDEO BRONCHOSCOPY WITH ENDOBRONCHIAL NAVIGATION;  Surgeon: [name], MD;  Location: [facility];  Service: Thoracic;  Laterality: N/A;  . VIDEO BRONCHOSCOPY WITH ENDOBRONCHIAL ULTRASOUND N/A 05/28/2016   Procedure: VIDEO BRONCHOSCOPY WITH ENDOBRONCHIAL ULTRASOUND;  Surgeon: [name], MD;  Location: [facility];  Service: Thoracic;  Laterality: N/A;    Family History Family History  Problem Relation Age of Onset  . Hypertension Mother   . Diabetes Father   . Heart disease Father   . Stroke Father   . Hypertension Sister      Social History  reports that he has been smoking cigarettes.  He started smoking about 56 years ago. He has a 5.50 pack-year smoking history. He has never used smokeless tobacco. He reports that he does not drink alcohol or use drugs.  Medications  Current Outpatient Medications:  .  acetaminophen (TYLENOL) 325 MG tablet, Take 2 tablets (650 mg total) by mouth every 6 (six) hours as needed for mild pain (or Fever >/= 101)., Disp: , Rfl:  .  albuterol (PROVENTIL HFA;VENTOLIN HFA) 108 (90 Base) MCG/ACT inhaler, Inhale 2 puffs into the lungs every 6 (six) hours as needed for wheezing or shortness of breath., Disp: 1 Inhaler, Rfl: 2 .  aspirin EC 81 MG tablet, Take 81 mg by mouth daily., Disp: , Rfl:  .  cloNIDine (CATAPRES) 0.1 MG tablet, Take 1 tablet (0.1 mg total) by mouth 3 (three) times  daily., Disp: 90 tablet, Rfl: 11 .  Durvalumab (IMFINZI IV), Inject into the vein. Every 2 weeks, Disp: , Rfl:  .  dutasteride (AVODART) 0.5 MG capsule, Take 1 capsule (0.5 mg total) by mouth daily., Disp: 90 capsule, Rfl: 3 .  finasteride (PROSCAR) 5 MG tablet, TAKE 1 TABLET BY MOUTH EVERY DAY, Disp: 30 tablet, Rfl: 0 .  fluticasone (FLONASE) 50 MCG/ACT nasal spray, Place 2 sprays into both nostrils daily., Disp: 16 g, Rfl: 6 .  Fluticasone-Salmeterol (ADVAIR DISKUS IN), Inhale 1 puff into the lungs 2 (two) times daily., Disp: , Rfl:  .  gabapentin (NEURONTIN) 300 MG capsule, Take 1 capsule in Am and 2 at night, Disp: 270  capsule, Rfl: 3 .  HYDROcodone-acetaminophen (NORCO/VICODIN) 5-325 MG tablet, Take 2 tablets by mouth every 4 (four) hours as needed., Disp: 6 tablet, Rfl: 0 .  ibuprofen (ADVIL,MOTRIN) 600 MG tablet, Take 1 tablet (600 mg total) by mouth every 6 (six) hours as needed., Disp: 30 tablet, Rfl: 0 .  INCRUSE ELLIPTA 62.5 MCG/INH AEPB, TAKE 1 PUFF BY MOUTH EVERY DAY, Disp: 90 each, Rfl: 0 .  KLOR-CON M20 20 MEQ tablet, TAKE 1 TABLET BY MOUTH EVERY DAY, Disp: 30 tablet, Rfl: 0 .  levothyroxine (SYNTHROID, LEVOTHROID) 25 MCG tablet, Take 1 tablet (25 mcg total) by mouth daily before breakfast., Disp: 30 tablet, Rfl: 1 .  lisinopril-hydrochlorothiazide (PRINZIDE,ZESTORETIC) 20-25 MG tablet, Take 1 tablet by mouth daily., Disp: 90 tablet, Rfl: 3 .  LORazepam (ATIVAN) 0.5 MG tablet, TAKE 1 TABLET BY MOUTH TWICE A DAY AS NEEDED, Disp: 30 tablet, Rfl: 1 .  magic mouthwash w/lidocaine SOLN, USE 1 TEASPOONFUL BY 15MINS BEFORE MEALS AND AS NEEDED FOR DISCOMFORT,SWISH AND SWALLOW, Disp: , Rfl: 1 .  meloxicam (MOBIC) 7.5 MG tablet, Take 1 tablet (7.5 mg total) by mouth daily as needed for pain (back pain)., Disp: 30 tablet, Rfl: 1 .  metoprolol tartrate (LOPRESSOR) 25 MG tablet, Take 1 tablet (25 mg total) by mouth 2 (two) times daily., Disp: 60 tablet, Rfl: 6 .  omeprazole (PRILOSEC) 20 MG  capsule, Take 20 mg by mouth daily. , Disp: , Rfl:  .  ondansetron (ZOFRAN) 8 MG tablet, Take 1 tablet (8 mg total) by mouth every 8 (eight) hours as needed for nausea or vomiting., Disp: 60 tablet, Rfl: 1 .  pravastatin (PRAVACHOL) 40 MG tablet, Take 40 mg by mouth every 3 (three) days. M-W-F, Disp: , Rfl:  .  prochlorperazine (COMPAZINE) 10 MG tablet, Take 1 tablet (10 mg total) by mouth every 6 (six) hours as needed for nausea or vomiting., Disp: 60 tablet, Rfl: 2 .  tamsulosin (FLOMAX) 0.4 MG CAPS capsule, TAKE 2 CAPSULES (0.8 MG TOTAL) BY MOUTH DAILY., Disp: 180 capsule, Rfl: 3 .  TRADJENTA 5 MG TABS tablet, TAKE 1 TABLET BY MOUTH EVERY DAY, Disp: 30 tablet, Rfl: 5 .  traZODone (DESYREL) 50 MG tablet, Take 1 tablet (50 mg total) by mouth at bedtime as needed for sleep., Disp: 30 tablet, Rfl: 0 .  varenicline (CHANTIX STARTING MONTH PAK) 0.5 MG X 11 & 1 MG X 42 tablet, Take one 0.5 mg tablet by mouth once daily for 3 days, then increase to one 0.5 mg tablet twice daily for 4 days, then increase to one 1 mg tablet twice daily., Disp: 53 tablet, Rfl: 0  Allergies Patient has no known allergies.  Review of Systems Review of Systems - Oncology ROS as per HPI otherwise 12 point ROS is negative other than abdominal distension.     Physical Exam  Vitals Wt Readings from Last 3 Encounters:  06/22/17 184 lb (83.5 kg)  06/19/17 187 lb (84.8 kg)  06/17/17 185 lb (83.9 kg)   Temp Readings from Last 3 Encounters:  06/19/17 97.6 F (36.4 C) (Oral)  06/09/17 97.7 F (36.5 C) (Oral)  06/05/17 97.8 F (36.6 C) (Oral)   BP Readings from Last 3 Encounters:  06/22/17 138/64  06/19/17 (!) 119/59  06/17/17 129/81   Pulse Readings from Last 3 Encounters:  06/22/17 (!) 109  06/19/17 92  06/17/17 (!) 111   Constitutional: Well-developed, well-nourished, and in no distress.   HENT: Head: Normocephalic and atraumatic.  Mouth/Throat: No oropharyngeal  exudate. Mucosa moist. Eyes: Pupils are  equal, round, and reactive to light. Conjunctivae are normal. No scleral icterus.  Neck: Normal range of motion. Neck supple. No JVD present.  Cardiovascular: Normal rate, regular rhythm and normal heart sounds.  Exam reveals no gallop and no friction rub.   No murmur heard. Pulmonary/Chest: Effort normal and breath sounds normal. No respiratory distress. No wheezes.No rales.  Abdominal: Soft. Bowel sounds are normal. Mild distension.  There is no tenderness. There is no guarding.  Musculoskeletal: No edema or tenderness.  Lymphadenopathy: No cervical, axillary or supraclavicular adenopathy.  Neurological: Alert and oriented to person, place, and time. No cranial nerve deficit.  Skin: Skin is warm and dry. No rash noted. No erythema. No pallor.  Psychiatric: Affect and judgment normal.   KUB done 06/05/2017:  No acute findings.  Normal gas pattern.    Labs Infusion on 06/05/2017  Component Date Value Ref Range Status  . LDH 06/05/2017 121  98 - 192 U/L Final   Performed at [facility], [address], [place], [place] [address]  . Sodium 06/05/2017 136  135 - 145 mmol/L Final  . Potassium 06/05/2017 3.6  3.5 - 5.1 mmol/L Final  . Chloride 06/05/2017 99* 101 - 111 mmol/L Final  . CO2 06/05/2017 26  22 - 32 mmol/L Final  . Glucose, Bld 06/05/2017 159* 65 - 99 mg/dL Final  . BUN 06/05/2017 16  6 - 20 mg/dL Final  . Creatinine, Ser 06/05/2017 0.84  0.61 - 1.24 mg/dL Final  . Calcium 06/05/2017 9.0  8.9 - 10.3 mg/dL Final  . Total Protein 06/05/2017 7.1  6.5 - 8.1 g/dL Final  . Albumin 06/05/2017 3.6  3.5 - 5.0 g/dL Final  . AST 06/05/2017 18  15 - 41 U/L Final  . ALT 06/05/2017 15* 17 - 63 U/L Final  . Alkaline Phosphatase 06/05/2017 75  38 - 126 U/L Final  . Total Bilirubin 06/05/2017 0.3  0.3 - 1.2 mg/dL Final  . GFR calc non Af Amer 06/05/2017 >60  >60 mL/min Final  . GFR calc Af Amer 06/05/2017 >60  >60 mL/min Final   Comment: (NOTE) The eGFR has been calculated using the CKD EPI  equation. This calculation has not been validated in all clinical situations. eGFR's persistently <60 mL/min signify possible Chronic Kidney Disease.   Georgiann Hahn gap 06/05/2017 11  5 - 15 Final   Performed at [facility], [address], [place], [place] [address]  . WBC 06/05/2017 6.6  4.0 - 10.5 K/uL Final  . RBC 06/05/2017 3.89* 4.22 - 5.81 MIL/uL Final  . Hemoglobin 06/05/2017 12.8* 13.0 - 17.0 g/dL Final  . HCT 06/05/2017 38.3* 39.0 - 52.0 % Final  . MCV 06/05/2017 98.5  78.0 - 100.0 fL Final  . MCH 06/05/2017 32.9  26.0 - 34.0 pg Final  . MCHC 06/05/2017 33.4  30.0 - 36.0 g/dL Final  . RDW 06/05/2017 14.3  11.5 - 15.5 % Final  . Platelets 06/05/2017 299  150 - 400 K/uL Final  . Neutrophils Relative % 06/05/2017 78  % Final  . Neutro Abs 06/05/2017 5.2  1.7 - 7.7 K/uL Final  . Lymphocytes Relative 06/05/2017 12  % Final  . Lymphs Abs 06/05/2017 0.8  0.7 - 4.0 K/uL Final  . Monocytes Relative 06/05/2017 8  % Final  . Monocytes Absolute 06/05/2017 0.6  0.1 - 1.0 K/uL Final  . Eosinophils Relative 06/05/2017 2  % Final  . Eosinophils Absolute 06/05/2017 0.1  0.0 - 0.7 K/uL Final  . Basophils Relative 06/05/2017 0  % Final  . Basophils Absolute 06/05/2017 0.0  0.0 - 0.1 K/uL Final   Performed at [facility], [address], [place], [place] [address]    CT CAP done 04/21/2017:  1. Stable to slightly smaller right upper lobe pulmonary lesion with progressive surrounding radiation changes. 2. Stable left apical and left lower lobe pulmonary nodules. 3. Stable emphysematous changes and areas of pulmonary scarring. 4. Stable small scattered mediastinal and hilar lymph nodes. No CT findings for metastatic disease involving the abdomen/pelvis or bony structures. 5. Advanced atherosclerotic calcifications involving the thoracic and abdominal aorta and branch vessels. 6. Stable small pericardial effusion. 7. Median lobe hypertrophy of the prostate gland.  Orders Placed This Encounter   Procedures  . DG Abd 1 View    Standing Status:   Future    Number of Occurrences:   1    Standing Expiration Date:   06/05/2018    Order Specific Question:   Reason for Exam (SYMPTOM  OR DIAGNOSIS REQUIRED)    Answer:   abdominal bloating and distension    Order Specific Question:   Preferred imaging location?    Answer:   [facility]    Order Specific Question:   Radiology Contrast Protocol - do NOT remove file path    Answer:   [email or web address]  . CBC with Differential/Platelet    Standing Status:   Future    Number of Occurrences:   1    Standing Expiration Date:   07/07/2018  . Comprehensive metabolic panel    Standing Status:   Future    Number of Occurrences:   1    Standing Expiration Date:   07/07/2018  . Lactate dehydrogenase    Standing Status:   Future    Number of Occurrences:   1    Standing Expiration Date:   07/07/2018       Zoila Shutter MD

## 2017-06-29 ENCOUNTER — Other Ambulatory Visit: Payer: Self-pay | Admitting: Family Medicine

## 2017-06-29 ENCOUNTER — Other Ambulatory Visit (HOSPITAL_COMMUNITY): Payer: Self-pay | Admitting: Adult Health

## 2017-06-29 DIAGNOSIS — L820 Inflamed seborrheic keratosis: Secondary | ICD-10-CM | POA: Diagnosis not present

## 2017-06-29 DIAGNOSIS — C3491 Malignant neoplasm of unspecified part of right bronchus or lung: Secondary | ICD-10-CM

## 2017-06-29 DIAGNOSIS — R110 Nausea: Secondary | ICD-10-CM

## 2017-06-30 NOTE — Telephone Encounter (Signed)
Last seen 06/19/17  Dr Wendi Snipes

## 2017-07-01 ENCOUNTER — Other Ambulatory Visit: Payer: Self-pay | Admitting: Family Medicine

## 2017-07-03 ENCOUNTER — Encounter (HOSPITAL_COMMUNITY): Payer: Self-pay | Admitting: Hematology

## 2017-07-03 ENCOUNTER — Other Ambulatory Visit: Payer: Self-pay

## 2017-07-03 ENCOUNTER — Inpatient Hospital Stay (HOSPITAL_COMMUNITY): Payer: Medicare Other

## 2017-07-03 ENCOUNTER — Inpatient Hospital Stay (HOSPITAL_BASED_OUTPATIENT_CLINIC_OR_DEPARTMENT_OTHER): Payer: Medicare Other | Admitting: Hematology

## 2017-07-03 VITALS — BP 122/60 | HR 96 | Temp 98.1°F | Resp 18 | Wt 182.8 lb

## 2017-07-03 DIAGNOSIS — R000 Tachycardia, unspecified: Secondary | ICD-10-CM | POA: Diagnosis not present

## 2017-07-03 DIAGNOSIS — E1140 Type 2 diabetes mellitus with diabetic neuropathy, unspecified: Secondary | ICD-10-CM | POA: Diagnosis not present

## 2017-07-03 DIAGNOSIS — C3411 Malignant neoplasm of upper lobe, right bronchus or lung: Secondary | ICD-10-CM

## 2017-07-03 DIAGNOSIS — C3491 Malignant neoplasm of unspecified part of right bronchus or lung: Secondary | ICD-10-CM

## 2017-07-03 DIAGNOSIS — Z5112 Encounter for antineoplastic immunotherapy: Secondary | ICD-10-CM | POA: Diagnosis not present

## 2017-07-03 DIAGNOSIS — E039 Hypothyroidism, unspecified: Secondary | ICD-10-CM

## 2017-07-03 LAB — CBC WITH DIFFERENTIAL/PLATELET
Basophils Absolute: 0 10*3/uL (ref 0.0–0.1)
Basophils Relative: 0 %
EOS ABS: 0.1 10*3/uL (ref 0.0–0.7)
Eosinophils Relative: 2 %
HCT: 40.3 % (ref 39.0–52.0)
HEMOGLOBIN: 13.3 g/dL (ref 13.0–17.0)
LYMPHS ABS: 0.7 10*3/uL (ref 0.7–4.0)
LYMPHS PCT: 10 %
MCH: 32.4 pg (ref 26.0–34.0)
MCHC: 33 g/dL (ref 30.0–36.0)
MCV: 98.3 fL (ref 78.0–100.0)
Monocytes Absolute: 0.9 10*3/uL (ref 0.1–1.0)
Monocytes Relative: 13 %
NEUTROS ABS: 5.5 10*3/uL (ref 1.7–7.7)
NEUTROS PCT: 75 %
Platelets: 303 10*3/uL (ref 150–400)
RBC: 4.1 MIL/uL — AB (ref 4.22–5.81)
RDW: 14.4 % (ref 11.5–15.5)
WBC: 7.3 10*3/uL (ref 4.0–10.5)

## 2017-07-03 LAB — COMPREHENSIVE METABOLIC PANEL
ALT: 16 U/L — ABNORMAL LOW (ref 17–63)
ANION GAP: 13 (ref 5–15)
AST: 19 U/L (ref 15–41)
Albumin: 3.6 g/dL (ref 3.5–5.0)
Alkaline Phosphatase: 77 U/L (ref 38–126)
BUN: 19 mg/dL (ref 6–20)
CHLORIDE: 98 mmol/L — AB (ref 101–111)
CO2: 26 mmol/L (ref 22–32)
Calcium: 9.3 mg/dL (ref 8.9–10.3)
Creatinine, Ser: 0.86 mg/dL (ref 0.61–1.24)
Glucose, Bld: 168 mg/dL — ABNORMAL HIGH (ref 65–99)
POTASSIUM: 3.8 mmol/L (ref 3.5–5.1)
Sodium: 137 mmol/L (ref 135–145)
Total Bilirubin: 0.6 mg/dL (ref 0.3–1.2)
Total Protein: 7.1 g/dL (ref 6.5–8.1)

## 2017-07-03 LAB — LACTATE DEHYDROGENASE: LDH: 128 U/L (ref 98–192)

## 2017-07-03 MED ORDER — SODIUM CHLORIDE 0.9 % IV SOLN
Freq: Once | INTRAVENOUS | Status: AC
  Administered 2017-07-03: 12:00:00 via INTRAVENOUS

## 2017-07-03 MED ORDER — SODIUM CHLORIDE 0.9 % IV SOLN
740.0000 mg | Freq: Once | INTRAVENOUS | Status: AC
  Administered 2017-07-03: 740 mg via INTRAVENOUS
  Filled 2017-07-03: qty 4.8

## 2017-07-03 MED ORDER — SODIUM CHLORIDE 0.9% FLUSH
10.0000 mL | INTRAVENOUS | Status: DC | PRN
  Administered 2017-07-03: 10 mL
  Filled 2017-07-03: qty 10

## 2017-07-03 MED ORDER — HEPARIN SOD (PORK) LOCK FLUSH 100 UNIT/ML IV SOLN
500.0000 [IU] | Freq: Once | INTRAVENOUS | Status: AC | PRN
  Administered 2017-07-03: 500 [IU]

## 2017-07-03 NOTE — Progress Notes (Signed)
[place], [place] [address]   CLINIC:  Medical Oncology/Hematology  PCP:  [name], MD [place] [address] [phone]   REASON FOR VISIT:  Follow-up for lung cancer.  CURRENT THERAPY: Durvalumab every 2 weeks.  BRIEF ONCOLOGIC HISTORY:    Non-small cell lung cancer, right ([place])   04/17/2016 Imaging    CT chest- 1. Examination is positive for bilateral upper lobe pulmonary lesions suspicious for malignancy. Further investigation with PET-CT and tissue sampling is advised. 2. Borderline enlarged paratracheal lymph nodes and right hilar nodes. Cannot rule out metastatic adenopathy. Attention to these areas on PET-CT is advised. 3. Emphysema 4. Aortic atherosclerosis and multi vessel coronary artery calcification.      04/18/2016 Initial Diagnosis    Adenocarcinoma of lung, right ([place])      04/24/2016 PET scan    1. Adjacent hypermetabolic nodules in the RIGHT upper lobe consistent bronchogenic carcinoma. 2. Suspicion of ipsilateral hilar and paratracheal nodal metastasis. 3. Nodule in the LEFT upper lobe with suspicious morphology has a relatively low metabolic activity for size. Favor synchronous primary bronchogenic carcinoma. 4. No metabolic activity associated with a small LEFT lower lobe pulmonary nodule. Recommend attention on follow-up.      05/06/2016 Imaging    MRI brain- Negative for metastatic disease.      05/28/2016 Procedure    1. Electromagnetic navigational bronchoscopy with brushings, needle     aspirations and biopsies of bilateral upper lobe nodules. 2. Endobronchial ultrasound with mediastinal and hilar lymph node     aspirations. By Dr. [name]      06/03/2016 Pathology Results    Diagnosis 1. Lung, biopsy, Right upper lobe - BLOOD AND BENIGN BRONCHIAL AND LUNG TISSUE. 2. Lung, biopsy, Left upper lobe - BLOOD AND BENIGN BRONCHIAL AND LUNG TISSUE.      06/03/2016  Pathology Results    Diagnosis TRANSBRONCHIAL NEEDLE ASPIRATION, NAVIGATION, RUL (SPECIMEN 5 OF 7, COLLECTED 05/28/16): MALIGNANT CELLS CONSISTENT WITH NON-SMALL CELL CARCINOMA Comment There are limited tumor cells in the cell block (insufficient for molecular testing). TTF-1 is positive in a few of the atypical cells. They appear negative for synaptophysin and cytokeratin 5/6. Overall there is limited tumor, but a lung adenocarcinoma is slightly favored. Dr. [name] has reviewed the case. The case was called to Dr. [name] on 06/03/2016.      06/13/2016 Procedure    Port placed by Dr. Arnoldo Morale      06/25/2016 - 08/15/2016 Chemotherapy    The patient had palonosetron (ALOXI) injection 0.25 mg, 0.25 mg, Intravenous,  Once, 6 of 6 cycles  CARBOplatin (PARAPLATIN) 220 mg in sodium chloride 0.9 % 250 mL chemo infusion, 220 mg (100 % of original dose 215.8 mg), Intravenous,  Once, 6 of 6 cycles Dose modification:   (original dose 215.8 mg, Cycle 1),   (original dose 215.8 mg, Cycle 5),   (original dose 215.8 mg, Cycle 6),   (original dose 215.8 mg, Cycle 2),   (original dose 215.8 mg, Cycle 3),   (original dose 215.8 mg, Cycle 4)  PACLitaxel (TAXOL) 90 mg in dextrose 5 % 250 mL chemo infusion ( for chemotherapy treatment.        06/25/2016 - 08/20/2016 Radiation Therapy    Radiation in [place], [place] by Dr. Quitman Livings.  R lung to 6600 cGy      07/09/2016 - 07/10/2016 Hospital Admission    Admit date: 07/09/2016  Admission diagnosis: Fever and chills Additional  comments: Negative work-up, discharged with course of Levaquin      07/09/2016 Treatment Plan Change    Treatment deferred x 1 week due to hospitalization       08/05/2016 - 08/06/2016 Hospital Admission    Admit date: 08/05/2016 Admission diagnosis: Joint pain Additional comments: Treated with steroids with symptomatic improvement      08/25/2016 Imaging    MRI brain- Negative for intracranial metastasis on this motion  degraded examination.  Stable mild-to-moderate chronic small vessel ischemic disease.      09/18/2016 PET scan    1. Partial metabolic response. Right upper lobe 2.6 cm hypermetabolic pulmonary nodule, decreased in size and metabolism. Mildly hypermetabolic right hilar nodal metastasis, decreased in metabolism. Right paratracheal lymph node is no longer hypermetabolic. No new or progressive metastatic disease. 2. Apical left upper lobe 0.9 cm solid pulmonary nodule, slightly decreased in size, with no significant metabolism. The change in size could indicate a neoplastic etiology for this nodule. 3. Additional subcentimeter pulmonary nodules in the left lung are stable and below PET resolution . 4. Additional findings include aortic atherosclerosis, coronary atherosclerosis, moderate emphysema, stable small pericardial effusion/thickening and mild sigmoid diverticulosis.      01/13/2017 Imaging    CT chest with contrast IMPRESSION: 1. No change in size of the hypermetabolic nodular thickening in the RIGHT upper lobe. 2. LEFT upper lobe spiculated nodule is stable. 3. Rounded LEFT lower lobe pulmonary nodule is stable. 4. No evidence of lung cancer progression.      05/08/2017 Adverse Reaction    Development of Hypothyroidism- likely secondary to immunotherapy.  Levothyroxine therapy initiated.         CANCER STAGING: Cancer Staging Non-small cell lung cancer, right [facility]) Staging form: Lung, AJCC 8th Edition - Clinical stage from 06/05/2016: Stage IIIA (cT1b(2), cN2, cM0) - Signed by Baird Cancer, PA-C on 08/21/2016    INTERVAL HISTORY:  Mr. Langdon 72 y.o. male returns for routine follow-up and consideration for next cycle of immunotherapy.  He complains of feeling tired all the time.  He denies any diarrhea.  He has occasional nausea.  He also has shortness of breath on exertion.  His appetite is reasonably well.  He reports tiredness has gotten worse since he started  Lopressor for high heart rate, which she continued only for 1 week and stopped.  He uses CPAP during sleep.  Denies any fevers or infections.  REVIEW OF SYSTEMS:  Review of Systems  Constitutional: Positive for fatigue.  HENT:  Negative.   Respiratory: Positive for shortness of breath.   Cardiovascular: Negative.   Gastrointestinal: Positive for nausea.  Genitourinary: Negative.    Musculoskeletal: Negative.   Skin: Negative.   Neurological: Negative.   Psychiatric/Behavioral: Negative.      PAST MEDICAL/SURGICAL HISTORY:  Past Medical History:  Diagnosis Date  . Adenocarcinoma of lung, right ([place]) 04/18/2016  . Anxiety   . Arthritis   . CHF (congestive heart failure) ([place])   . COPD (chronic obstructive pulmonary disease) ([place])   . Depression   . Diabetes mellitus without complication ([facility])    no meds  . DM (diabetes mellitus) ([place]) 07/09/2016  . Dyspnea   . History of kidney stones   . Hyperlipidemia   . Hypertension   . Macular degeneration   . Neuropathy   . Non-small cell lung cancer, right ([facility] [place]) 04/18/2016  . Prostatitis   . Pulmonary nodule, left 07/16/2016  . Sleep apnea    cpap   Past Surgical History:  Procedure Laterality Date  . CATARACT EXTRACTION, BILATERAL Bilateral   . NO PAST SURGERIES    . PORTACATH PLACEMENT Left 06/13/2016   Procedure: INSERTION PORT-A-CATH;  Surgeon: Aviva Signs, MD;  Location: [facility];  Service: General;  Laterality: Left;  Marland Kitchen VIDEO BRONCHOSCOPY WITH ENDOBRONCHIAL NAVIGATION N/A 05/28/2016   Procedure: VIDEO BRONCHOSCOPY WITH ENDOBRONCHIAL NAVIGATION;  Surgeon: [name], MD;  Location: [facility];  Service: Thoracic;  Laterality: N/A;  . VIDEO BRONCHOSCOPY WITH ENDOBRONCHIAL ULTRASOUND N/A 05/28/2016   Procedure: VIDEO BRONCHOSCOPY WITH ENDOBRONCHIAL ULTRASOUND;  Surgeon: [name], MD;  Location: [facility];  Service: Thoracic;  Laterality: N/A;     SOCIAL HISTORY:  Social History   Socioeconomic History  . Marital  status: Divorced    Spouse name: Not on file  . Number of children: 5  . Years of education: 10  . Highest education level: 10th grade  Occupational History  . Occupation: Retired  Scientific laboratory technician  . Financial resource strain: Not very hard  . Food insecurity:    Worry: Never true    Inability: Never true  . Transportation needs:    Medical: No    Non-medical: No  Tobacco Use  . Smoking status: Current Every Day Smoker    Packs/day: 0.10    Years: 55.00    Pack years: 5.50    Types: Cigarettes    Start date: 03/11/1961  . Smokeless tobacco: Never Used  . Tobacco comment: smoking 1 to 2 cigarettes per day  Substance and Sexual Activity  . Alcohol use: No  . Drug use: No  . Sexual activity: Yes    Birth control/protection: None  Lifestyle  . Physical activity:    Days per week: 0 days    Minutes per session: 0 min  . Stress: To some extent  Relationships  . Social connections:    Talks on phone: More than three times a week    Gets together: More than three times a week    Attends religious service: Never    Active member of club or organization: No    Attends meetings of clubs or organizations: Never    Relationship status: Divorced  . Intimate partner violence:    Fear of current or ex partner: Not on file    Emotionally abused: Not on file    Physically abused: Not on file    Forced sexual activity: Not on file  Other Topics Concern  . Not on file  Social History Narrative  . Not on file    FAMILY HISTORY:  Family History  Problem Relation Age of Onset  . Hypertension Mother   . Diabetes Father   . Heart disease Father   . Stroke Father   . Hypertension Sister     CURRENT MEDICATIONS:  Outpatient Encounter Medications as of 07/03/2017  Medication Sig Note  . acetaminophen (TYLENOL) 325 MG tablet Take 2 tablets (650 mg total) by mouth every 6 (six) hours as needed for mild pain (or Fever >/= 101).   Marland Kitchen albuterol (PROVENTIL HFA;VENTOLIN HFA) 108 (90 Base)  MCG/ACT inhaler Inhale 2 puffs into the lungs every 6 (six) hours as needed for wheezing or shortness of breath.   Marland Kitchen aspirin EC 81 MG tablet Take 81 mg by mouth daily.   . cloNIDine (CATAPRES) 0.1 MG tablet Take 1 tablet (0.1 mg total) by mouth 3 (three) times daily.   [place] (IMFINZI IV) Inject into the vein. Every 2 weeks   . dutasteride (  AVODART) 0.5 MG capsule Take 1 capsule (0.5 mg total) by mouth daily.   . finasteride (PROSCAR) 5 MG tablet TAKE 1 TABLET BY MOUTH EVERY DAY   . fluticasone (FLONASE) 50 MCG/ACT nasal spray Place 2 sprays into both nostrils daily. 12/05/2016: As needed  . Fluticasone-Salmeterol (ADVAIR DISKUS IN) Inhale 1 puff into the lungs 2 (two) times daily. 12/05/2016: As needed  . gabapentin (NEURONTIN) 300 MG capsule Take 1 capsule in Am and 2 at night   . HYDROcodone-acetaminophen (NORCO/VICODIN) 5-325 MG tablet Take 2 tablets by mouth every 4 (four) hours as needed. 12/05/2016: As needed  . ibuprofen (ADVIL,MOTRIN) 600 MG tablet Take 1 tablet (600 mg total) by mouth every 6 (six) hours as needed.   . INCRUSE ELLIPTA 62.5 MCG/INH AEPB TAKE 1 PUFF BY MOUTH EVERY DAY   . KLOR-CON M20 20 MEQ tablet TAKE 1 TABLET BY MOUTH EVERY DAY   . levothyroxine (SYNTHROID, LEVOTHROID) 25 MCG tablet Take 1 tablet (25 mcg total) by mouth daily before breakfast.   . lisinopril-hydrochlorothiazide (PRINZIDE,ZESTORETIC) 20-25 MG tablet Take 1 tablet by mouth daily.   Marland Kitchen LORazepam (ATIVAN) 0.5 MG tablet TAKE 1 TABLET BY MOUTH TWICE DAILY AS NEEDED   . magic mouthwash w/lidocaine SOLN USE 1 TEASPOONFUL BY 15MINS BEFORE MEALS AND AS NEEDED FOR [place] AND [place] 12/05/2016: As needed  . meloxicam (MOBIC) 7.5 MG tablet Take 1 tablet (7.5 mg total) by mouth daily as needed for pain (back pain).   . metoprolol tartrate (LOPRESSOR) 25 MG tablet Take 1 tablet (25 mg total) by mouth 2 (two) times daily.   Marland Kitchen omeprazole (PRILOSEC) 20 MG capsule Take 20 mg by mouth daily.    . ondansetron  (ZOFRAN) 8 MG tablet Take 1 tablet (8 mg total) by mouth every 8 (eight) hours as needed for nausea or vomiting.   . pravastatin (PRAVACHOL) 40 MG tablet Take 40 mg by mouth every 3 (three) days. M-W-F   . prochlorperazine (COMPAZINE) 10 MG tablet TAKE 1 TABLET (10 MG TOTAL) BY MOUTH EVERY 6 (SIX) HOURS AS NEEDED FOR NAUSEA OR VOMITING.   . tamsulosin (FLOMAX) 0.4 MG CAPS capsule TAKE 2 CAPSULES (0.8 MG TOTAL) BY MOUTH DAILY.   Marland Kitchen TRADJENTA 5 MG TABS tablet TAKE 1 TABLET BY MOUTH EVERY DAY   . traZODone (DESYREL) 50 MG tablet Take 1 tablet (50 mg total) by mouth at bedtime as needed for sleep.   . varenicline (CHANTIX STARTING MONTH PAK) 0.5 MG X 11 & 1 MG X 42 tablet Take one 0.5 mg tablet by mouth once daily for 3 days, then increase to one 0.5 mg tablet twice daily for 4 days, then increase to one 1 mg tablet twice daily.    No facility-administered encounter medications on file as of 07/03/2017.     ALLERGIES:  No Known Allergies   PHYSICAL EXAM:  ECOG Performance status: 1  Vitals were reviewed by me.   LABORATORY DATA:  I have reviewed the labs as listed.  CBC    Component Value Date/Time   WBC 7.3 07/03/2017 1044   RBC 4.10 (L) 07/03/2017 1044   HGB 13.3 07/03/2017 1044   HGB 9.6 (L) 08/28/2016 1117   HCT 40.3 07/03/2017 1044   HCT 27.7 (L) 08/28/2016 1117   PLT 303 07/03/2017 1044   PLT 268 08/28/2016 1117   MCV 98.3 07/03/2017 1044   MCV 96 08/28/2016 1117   MCH 32.4 07/03/2017 1044   MCHC 33.0 07/03/2017 1044   RDW 14.4  07/03/2017 1044   RDW 19.4 (H) 08/28/2016 1117   LYMPHSABS 0.7 07/03/2017 1044   LYMPHSABS 0.6 (L) 08/28/2016 1117   MONOABS 0.9 07/03/2017 1044   EOSABS 0.1 07/03/2017 1044   EOSABS 0.0 08/28/2016 1117   BASOSABS 0.0 07/03/2017 1044   BASOSABS 0.0 08/28/2016 1117   CMP Latest Ref Rng & Units 07/03/2017 06/19/2017 06/05/2017  Glucose 65 - 99 mg/dL 168(H) 149(H) 159(H)  BUN 6 - 20 mg/dL 19 19 16   Creatinine 0.61 - 1.24 mg/dL 0.86 0.88 0.84   Sodium 135 - 145 mmol/L 137 138 136  Potassium 3.5 - 5.1 mmol/L 3.8 3.4(L) 3.6  Chloride 101 - 111 mmol/L 98(L) 100(L) 99(L)  CO2 22 - 32 mmol/L 26 28 26   Calcium 8.9 - 10.3 mg/dL 9.3 8.8(L) 9.0  Total Protein 6.5 - 8.1 g/dL 7.1 6.9 7.1  Total Bilirubin 0.3 - 1.2 mg/dL 0.6 0.5 0.3  Alkaline Phos 38 - 126 U/L 77 78 75  AST 15 - 41 U/L 19 18 18   ALT 17 - 63 U/L 16(L) 14(L) 15(L)       DIAGNOSTIC IMAGING:  I have reviewed his previous scans and agree with the reports.     ASSESSMENT & PLAN:   Non-small cell lung cancer, right ([facility]) 1.Stage IIIA (cT1BcN2cM0) NSCLC, favoring adenocarcinoma: Scan at diagnosis showed 2 right upper lobe pulmonary nodules measuring 1.7 and 2.0 cm, ipsilateral mediastinal adenopathy, status post bronchoscopy on 05/28/2016, left upper lobe pulmonary nodule not biopsy proven but suspicious with imaging.  He finished chemoradiation therapy with weekly carboplatin and paclitaxel on 08/20/2016.  Consolidation Durvalumab was started on 09/26/2016.  Today his labs are adequate to proceed with his treatment.  He will come back in 2 weeks for his next treatment.  I will see him back in 4 weeks with repeat CT scan of the chest.  He does not have any metastatic disease outside the chest.  2.  Fatigue: He reports worsening of his fatigue when he started Lopressor 25 mg twice daily which was prescribed by his cardiologist for tachycardia.  He took it for 1 week and stopped.  He still continues to feel tired.  I have asked him to move around and do some exercises during the daytime.  He also uses CPAP for many years for his sleep apnea.  3.  Hypothyroidism: He is currently taking Synthroid 25 mcg daily.  We will monitor his TSH closely.        Orders placed this encounter:  Orders Placed This Encounter  Procedures  . CT Chest W Contrast      Derek Jack, MD [place] [phone]

## 2017-07-03 NOTE — Progress Notes (Signed)
[place] reviewed with and pt seen by Dr. Delton Coombes and pt approved for Imfinzi infusion today per MD                                                                           John [name] tolerated Imfinzi infusion well without complaints or incident. VSS upon discharge. Pt discharged self ambulatory in satisfactory condition accompanied by his wife

## 2017-07-03 NOTE — Assessment & Plan Note (Signed)
1.Stage IIIA (cT1BcN2cM0) NSCLC, favoring adenocarcinoma: Scan at diagnosis showed 2 right upper lobe pulmonary nodules measuring 1.7 and 2.0 cm, ipsilateral mediastinal adenopathy, status post bronchoscopy on 05/28/2016, left upper lobe pulmonary nodule not biopsy proven but suspicious with imaging.  He finished chemoradiation therapy with weekly carboplatin and paclitaxel on 08/20/2016.  Consolidation Durvalumab was started on 09/26/2016.  Today his labs are adequate to proceed with his treatment.  He will come back in 2 weeks for his next treatment.  I will see him back in 4 weeks with repeat CT scan of the chest.  He does not have any metastatic disease outside the chest.  2.  Fatigue: He reports worsening of his fatigue when he started Lopressor 25 mg twice daily which was prescribed by his cardiologist for tachycardia.  He took it for 1 week and stopped.  He still continues to feel tired.  I have asked him to move around and do some exercises during the daytime.  He also uses CPAP for many years for his sleep apnea.  3.  Hypothyroidism: He is currently taking Synthroid 25 mcg daily.  We will monitor his TSH closely.

## 2017-07-03 NOTE — Patient Instructions (Signed)
[facility] Discharge Instructions for Patients Receiving Chemotherapy   Beginning January 23rd 2017 lab work for the [facility] will be done in the  Main lab at [facility] on 1st floor. If you have a lab appointment with the [facility] please come in thru the  Main Entrance and check in at the main information desk   Today you received the following chemotherapy agents Imfinzi. Follow-up as scheduled. Call clinic for any questions or concerns  To help prevent nausea and vomiting after your treatment, we encourage you to take your nausea medication   If you develop nausea and vomiting, or diarrhea that is not controlled by your medication, call the clinic.  The clinic phone number is [phone]. Office hours are Monday-Friday 8:30am-5:00pm.  BELOW ARE SYMPTOMS THAT SHOULD BE REPORTED IMMEDIATELY:  *FEVER GREATER THAN 101.0 F  *CHILLS WITH OR WITHOUT FEVER  NAUSEA AND VOMITING THAT IS NOT CONTROLLED WITH YOUR NAUSEA MEDICATION  *UNUSUAL SHORTNESS OF BREATH  *UNUSUAL BRUISING OR BLEEDING  TENDERNESS IN MOUTH AND THROAT WITH OR WITHOUT PRESENCE OF ULCERS  *URINARY PROBLEMS  *BOWEL PROBLEMS  UNUSUAL RASH Items with * indicate a potential emergency and should be followed up as soon as possible. If you have an emergency after office hours please contact your primary care physician or go to the nearest emergency department.  Please call the clinic during office hours if you have any questions or concerns.   You may also contact the Patient Navigator at [phone] should you have any questions or need assistance in obtaining follow up care.      Resources For Cancer Patients and their Caregivers ? American Cancer Society: Can assist with transportation, wigs, general needs, runs Look Good Feel Better.        769 235 8261 ? Cancer Care: Provides financial assistance, online support groups, medication/co-pay assistance.  1-800-813-HOPE  305-562-7817) ? [place] Assists [place] cancer patients and their families through emotional , educational and financial support.  6146714500 ? [place] DSS Where to apply for food stamps, Medicaid and utility assistance. 651-561-3409 ? [employer]: Transportation to medical appointments. (647) [phone] ? Social Security Administration: May apply for disability if have a Stage IV cancer. 408-012-7571 231-377-2845 ? [employer], Disability and Transit Services: Assists with nutrition, care and transit needs. 587-873-2637

## 2017-07-05 ENCOUNTER — Other Ambulatory Visit (HOSPITAL_COMMUNITY): Payer: Self-pay | Admitting: Oncology

## 2017-07-05 DIAGNOSIS — E032 Hypothyroidism due to medicaments and other exogenous substances: Secondary | ICD-10-CM

## 2017-07-05 DIAGNOSIS — C3491 Malignant neoplasm of unspecified part of right bronchus or lung: Secondary | ICD-10-CM

## 2017-07-06 ENCOUNTER — Other Ambulatory Visit: Payer: Self-pay | Admitting: Family Medicine

## 2017-07-09 ENCOUNTER — Ambulatory Visit (INDEPENDENT_AMBULATORY_CARE_PROVIDER_SITE_OTHER): Payer: Medicare Other

## 2017-07-09 ENCOUNTER — Other Ambulatory Visit: Payer: Self-pay

## 2017-07-09 DIAGNOSIS — I3139 Other pericardial effusion (noninflammatory): Secondary | ICD-10-CM

## 2017-07-09 DIAGNOSIS — I313 Pericardial effusion (noninflammatory): Secondary | ICD-10-CM

## 2017-07-14 ENCOUNTER — Telehealth: Payer: Self-pay | Admitting: *Deleted

## 2017-07-14 NOTE — Telephone Encounter (Signed)
Notes recorded by Laurine Blazer, LPN on 1/0/0712 at 19:75 AM EDT Patient notified. Copy to pmd. Follow up scheduled for July with Dr. Bronson Ing.   ------  Notes recorded by Laurine Blazer, LPN on 11/12/3252 at 9:82 AM EDT Left message to return call.  ------  Notes recorded by Herminio Commons, MD on 07/09/2017 at 4:45 PM EDT Normal pumping function. No significant fluid accumulation around the heart.`

## 2017-07-17 ENCOUNTER — Encounter (HOSPITAL_COMMUNITY): Payer: Self-pay

## 2017-07-17 ENCOUNTER — Inpatient Hospital Stay (HOSPITAL_COMMUNITY): Payer: Medicare Other | Attending: Oncology

## 2017-07-17 VITALS — BP 129/62 | HR 98 | Temp 97.4°F | Resp 18 | Wt 181.4 lb

## 2017-07-17 DIAGNOSIS — D72810 Lymphocytopenia: Secondary | ICD-10-CM | POA: Diagnosis not present

## 2017-07-17 DIAGNOSIS — K5900 Constipation, unspecified: Secondary | ICD-10-CM | POA: Diagnosis not present

## 2017-07-17 DIAGNOSIS — E860 Dehydration: Secondary | ICD-10-CM | POA: Insufficient documentation

## 2017-07-17 DIAGNOSIS — Z5112 Encounter for antineoplastic immunotherapy: Secondary | ICD-10-CM | POA: Diagnosis not present

## 2017-07-17 DIAGNOSIS — E1142 Type 2 diabetes mellitus with diabetic polyneuropathy: Secondary | ICD-10-CM | POA: Diagnosis not present

## 2017-07-17 DIAGNOSIS — E1165 Type 2 diabetes mellitus with hyperglycemia: Secondary | ICD-10-CM | POA: Diagnosis not present

## 2017-07-17 DIAGNOSIS — I509 Heart failure, unspecified: Secondary | ICD-10-CM | POA: Diagnosis not present

## 2017-07-17 DIAGNOSIS — E039 Hypothyroidism, unspecified: Secondary | ICD-10-CM | POA: Diagnosis not present

## 2017-07-17 DIAGNOSIS — C3491 Malignant neoplasm of unspecified part of right bronchus or lung: Secondary | ICD-10-CM

## 2017-07-17 DIAGNOSIS — E871 Hypo-osmolality and hyponatremia: Secondary | ICD-10-CM | POA: Insufficient documentation

## 2017-07-17 DIAGNOSIS — I110 Hypertensive heart disease with heart failure: Secondary | ICD-10-CM | POA: Insufficient documentation

## 2017-07-17 DIAGNOSIS — C3411 Malignant neoplasm of upper lobe, right bronchus or lung: Secondary | ICD-10-CM | POA: Diagnosis not present

## 2017-07-17 LAB — COMPREHENSIVE METABOLIC PANEL
ALBUMIN: 3.7 g/dL (ref 3.5–5.0)
ALT: 16 U/L — ABNORMAL LOW (ref 17–63)
ANION GAP: 12 (ref 5–15)
AST: 24 U/L (ref 15–41)
Alkaline Phosphatase: 74 U/L (ref 38–126)
BILIRUBIN TOTAL: 0.5 mg/dL (ref 0.3–1.2)
BUN: 20 mg/dL (ref 6–20)
CHLORIDE: 100 mmol/L — AB (ref 101–111)
CO2: 26 mmol/L (ref 22–32)
Calcium: 9.2 mg/dL (ref 8.9–10.3)
Creatinine, Ser: 0.83 mg/dL (ref 0.61–1.24)
GFR calc Af Amer: >60 mL/min (ref >60)
GFR calc non Af Amer: >60 mL/min (ref >60)
GLUCOSE: 172 mg/dL — AB (ref 65–99)
POTASSIUM: 3.4 mmol/L — AB (ref 3.5–5.1)
Sodium: 138 mmol/L (ref 135–145)
Total Protein: 7.1 g/dL (ref 6.5–8.1)

## 2017-07-17 LAB — CBC WITH DIFFERENTIAL/PLATELET
BASOS ABS: 0 10*3/uL (ref 0.0–0.1)
Basophils Relative: 0 %
Eosinophils Absolute: 0.2 10*3/uL (ref 0.0–0.7)
Eosinophils Relative: 3 %
HEMATOCRIT: 38.8 % — AB (ref 39.0–52.0)
Hemoglobin: 12.9 g/dL — ABNORMAL LOW (ref 13.0–17.0)
LYMPHS PCT: 15 %
Lymphs Abs: 0.8 10*3/uL (ref 0.7–4.0)
MCH: 32.8 pg (ref 26.0–34.0)
MCHC: 33.2 g/dL (ref 30.0–36.0)
MCV: 98.7 fL (ref 78.0–100.0)
MONO ABS: 0.6 10*3/uL (ref 0.1–1.0)
Monocytes Relative: 11 %
NEUTROS ABS: 4.1 10*3/uL (ref 1.7–7.7)
Neutrophils Relative %: 71 %
Platelets: 284 10*3/uL (ref 150–400)
RBC: 3.93 MIL/uL — AB (ref 4.22–5.81)
RDW: 14.7 % (ref 11.5–15.5)
WBC: 5.7 10*3/uL (ref 4.0–10.5)

## 2017-07-17 LAB — TSH: TSH: 4.578 u[IU]/mL — ABNORMAL HIGH (ref 0.350–4.500)

## 2017-07-17 LAB — LACTATE DEHYDROGENASE: LDH: 148 U/L (ref 98–192)

## 2017-07-17 MED ORDER — SODIUM CHLORIDE 0.9 % IV SOLN
Freq: Once | INTRAVENOUS | Status: AC
  Administered 2017-07-17: 13:00:00 via INTRAVENOUS

## 2017-07-17 MED ORDER — HEPARIN SOD (PORK) LOCK FLUSH 100 UNIT/ML IV SOLN
500.0000 [IU] | Freq: Once | INTRAVENOUS | Status: AC | PRN
  Administered 2017-07-17: 500 [IU]

## 2017-07-17 MED ORDER — SODIUM CHLORIDE 0.9 % IV SOLN
740.0000 mg | Freq: Once | INTRAVENOUS | Status: AC
  Administered 2017-07-17: 740 mg via INTRAVENOUS
  Filled 2017-07-17: qty 10

## 2017-07-17 MED ORDER — SODIUM CHLORIDE 0.9% FLUSH
10.0000 mL | INTRAVENOUS | Status: DC | PRN
  Administered 2017-07-17: 10 mL
  Filled 2017-07-17: qty 10

## 2017-07-17 NOTE — Patient Instructions (Signed)
[facility] Discharge Instructions for Patients Receiving Chemotherapy   Beginning January 23rd 2017 lab work for the [facility] will be done in the  Main lab at [facility] on 1st floor. If you have a lab appointment with the [facility] please come in thru the  Main Entrance and check in at the main information desk   Today you received the following chemotherapy agents Imfinzi. Follow-up as scheduled. Call clinic for any questions or concerns  To help prevent nausea and vomiting after your treatment, we encourage you to take your nausea medication   If you develop nausea and vomiting, or diarrhea that is not controlled by your medication, call the clinic.  The clinic phone number is [phone]. Office hours are Monday-Friday 8:30am-5:00pm.  BELOW ARE SYMPTOMS THAT SHOULD BE REPORTED IMMEDIATELY:  *FEVER GREATER THAN 101.0 F  *CHILLS WITH OR WITHOUT FEVER  NAUSEA AND VOMITING THAT IS NOT CONTROLLED WITH YOUR NAUSEA MEDICATION  *UNUSUAL SHORTNESS OF BREATH  *UNUSUAL BRUISING OR BLEEDING  TENDERNESS IN MOUTH AND THROAT WITH OR WITHOUT PRESENCE OF ULCERS  *URINARY PROBLEMS  *BOWEL PROBLEMS  UNUSUAL RASH Items with * indicate a potential emergency and should be followed up as soon as possible. If you have an emergency after office hours please contact your primary care physician or go to the nearest emergency department.  Please call the clinic during office hours if you have any questions or concerns.   You may also contact the Patient Navigator at [phone] should you have any questions or need assistance in obtaining follow up care.      Resources For Cancer Patients and their Caregivers ? American Cancer Society: Can assist with transportation, wigs, general needs, runs Look Good Feel Better.        769 235 8261 ? Cancer Care: Provides financial assistance, online support groups, medication/co-pay assistance.  1-800-813-HOPE  305-562-7817) ? [place] Assists [place] cancer patients and their families through emotional , educational and financial support.  6146714500 ? [place] DSS Where to apply for food stamps, Medicaid and utility assistance. 651-561-3409 ? [employer]: Transportation to medical appointments. (647) [phone] ? Social Security Administration: May apply for disability if have a Stage IV cancer. 408-012-7571 231-377-2845 ? [employer], Disability and Transit Services: Assists with nutrition, care and transit needs. 587-873-2637

## 2017-07-17 NOTE — Progress Notes (Signed)
[place] reviewed with Dr. Delton Coombes and pt approved for Imfinzi infusion today per MD                    John [name] tolerated Imfinzi infusion well without complaints or incident. VSS upon discharge. Pt discharged self ambulatory in satisfactory condition

## 2017-07-28 ENCOUNTER — Ambulatory Visit (HOSPITAL_COMMUNITY)
Admission: RE | Admit: 2017-07-28 | Discharge: 2017-07-28 | Disposition: A | Discharge status: Home / Self Care | Payer: Medicare Other | Source: Ambulatory Visit | Attending: Hematology | Admitting: Hematology

## 2017-07-28 DIAGNOSIS — I700 Atherosclerosis of aorta: Secondary | ICD-10-CM | POA: Diagnosis not present

## 2017-07-28 DIAGNOSIS — R918 Other nonspecific abnormal finding of lung field: Secondary | ICD-10-CM | POA: Diagnosis not present

## 2017-07-28 DIAGNOSIS — C3491 Malignant neoplasm of unspecified part of right bronchus or lung: Secondary | ICD-10-CM | POA: Insufficient documentation

## 2017-07-28 DIAGNOSIS — C3490 Malignant neoplasm of unspecified part of unspecified bronchus or lung: Secondary | ICD-10-CM | POA: Diagnosis not present

## 2017-07-28 MED ORDER — IOPAMIDOL (ISOVUE-300) INJECTION 61%
100.0000 mL | Freq: Once | INTRAVENOUS | Status: AC | PRN
  Administered 2017-07-28: 75 mL via INTRAVENOUS

## 2017-07-28 MED ORDER — IOPAMIDOL (ISOVUE-300) INJECTION 61%: 100.0000 mL | Freq: Once | INTRAVENOUS | Status: DC | PRN

## 2017-07-30 ENCOUNTER — Other Ambulatory Visit (HOSPITAL_COMMUNITY): Payer: Self-pay | Admitting: Adult Health

## 2017-07-30 DIAGNOSIS — E032 Hypothyroidism due to medicaments and other exogenous substances: Secondary | ICD-10-CM

## 2017-07-30 DIAGNOSIS — C3491 Malignant neoplasm of unspecified part of right bronchus or lung: Secondary | ICD-10-CM

## 2017-07-30 NOTE — Telephone Encounter (Signed)
Okay to refill? 

## 2017-07-31 ENCOUNTER — Inpatient Hospital Stay (HOSPITAL_COMMUNITY): Payer: Medicare Other

## 2017-07-31 ENCOUNTER — Encounter (HOSPITAL_COMMUNITY): Payer: Self-pay

## 2017-07-31 ENCOUNTER — Encounter (HOSPITAL_COMMUNITY): Payer: Self-pay | Admitting: Hematology

## 2017-07-31 ENCOUNTER — Other Ambulatory Visit (HOSPITAL_COMMUNITY): Payer: Self-pay | Admitting: Oncology

## 2017-07-31 ENCOUNTER — Inpatient Hospital Stay (HOSPITAL_BASED_OUTPATIENT_CLINIC_OR_DEPARTMENT_OTHER): Payer: Medicare Other | Admitting: Oncology

## 2017-07-31 VITALS — BP 113/52 | HR 98 | Temp 97.7°F | Resp 18 | Wt 181.8 lb

## 2017-07-31 DIAGNOSIS — E1142 Type 2 diabetes mellitus with diabetic polyneuropathy: Secondary | ICD-10-CM

## 2017-07-31 DIAGNOSIS — I509 Heart failure, unspecified: Secondary | ICD-10-CM

## 2017-07-31 DIAGNOSIS — K5900 Constipation, unspecified: Secondary | ICD-10-CM | POA: Diagnosis not present

## 2017-07-31 DIAGNOSIS — E871 Hypo-osmolality and hyponatremia: Secondary | ICD-10-CM

## 2017-07-31 DIAGNOSIS — C3491 Malignant neoplasm of unspecified part of right bronchus or lung: Secondary | ICD-10-CM

## 2017-07-31 DIAGNOSIS — D72810 Lymphocytopenia: Secondary | ICD-10-CM | POA: Diagnosis not present

## 2017-07-31 DIAGNOSIS — C3411 Malignant neoplasm of upper lobe, right bronchus or lung: Secondary | ICD-10-CM | POA: Diagnosis not present

## 2017-07-31 DIAGNOSIS — E039 Hypothyroidism, unspecified: Secondary | ICD-10-CM

## 2017-07-31 DIAGNOSIS — Z5112 Encounter for antineoplastic immunotherapy: Secondary | ICD-10-CM | POA: Diagnosis not present

## 2017-07-31 DIAGNOSIS — I110 Hypertensive heart disease with heart failure: Secondary | ICD-10-CM | POA: Diagnosis not present

## 2017-07-31 DIAGNOSIS — E1165 Type 2 diabetes mellitus with hyperglycemia: Secondary | ICD-10-CM | POA: Diagnosis not present

## 2017-07-31 DIAGNOSIS — I10 Essential (primary) hypertension: Secondary | ICD-10-CM

## 2017-07-31 DIAGNOSIS — M5432 Sciatica, left side: Secondary | ICD-10-CM

## 2017-07-31 DIAGNOSIS — M5431 Sciatica, right side: Secondary | ICD-10-CM

## 2017-07-31 DIAGNOSIS — J431 Panlobular emphysema: Secondary | ICD-10-CM

## 2017-07-31 DIAGNOSIS — R911 Solitary pulmonary nodule: Secondary | ICD-10-CM

## 2017-07-31 LAB — COMPREHENSIVE METABOLIC PANEL
ALK PHOS: 76 U/L (ref 38–126)
ALT: 17 U/L (ref 17–63)
AST: 19 U/L (ref 15–41)
Albumin: 3.7 g/dL (ref 3.5–5.0)
Anion gap: 12 (ref 5–15)
BILIRUBIN TOTAL: 0.5 mg/dL (ref 0.3–1.2)
BUN: 21 mg/dL — AB (ref 6–20)
CALCIUM: 9.2 mg/dL (ref 8.9–10.3)
CO2: 27 mmol/L (ref 22–32)
CREATININE: 0.94 mg/dL (ref 0.61–1.24)
Chloride: 100 mmol/L — ABNORMAL LOW (ref 101–111)
Glucose, Bld: 200 mg/dL — ABNORMAL HIGH (ref 65–99)
Potassium: 3.6 mmol/L (ref 3.5–5.1)
Sodium: 139 mmol/L (ref 135–145)
TOTAL PROTEIN: 7 g/dL (ref 6.5–8.1)

## 2017-07-31 LAB — CBC WITH DIFFERENTIAL/PLATELET
BASOS ABS: 0 10*3/uL (ref 0.0–0.1)
Basophils Relative: 0 %
EOS PCT: 2 %
Eosinophils Absolute: 0.1 10*3/uL (ref 0.0–0.7)
HEMATOCRIT: 39.6 % (ref 39.0–52.0)
Hemoglobin: 13 g/dL (ref 13.0–17.0)
LYMPHS ABS: 0.5 10*3/uL — AB (ref 0.7–4.0)
LYMPHS PCT: 5 %
MCH: 32.6 pg (ref 26.0–34.0)
MCHC: 32.8 g/dL (ref 30.0–36.0)
MCV: 99.2 fL (ref 78.0–100.0)
Monocytes Absolute: 0.9 10*3/uL (ref 0.1–1.0)
Monocytes Relative: 11 %
NEUTROS ABS: 7.2 10*3/uL (ref 1.7–7.7)
Neutrophils Relative %: 82 %
PLATELETS: 300 10*3/uL (ref 150–400)
RBC: 3.99 MIL/uL — AB (ref 4.22–5.81)
RDW: 15 % (ref 11.5–15.5)
WBC: 8.7 10*3/uL (ref 4.0–10.5)

## 2017-07-31 LAB — LACTATE DEHYDROGENASE: LDH: 131 U/L (ref 98–192)

## 2017-07-31 LAB — TSH: TSH: 4.548 u[IU]/mL — AB (ref 0.350–4.500)

## 2017-07-31 MED ORDER — SODIUM CHLORIDE 0.9% FLUSH
10.0000 mL | INTRAVENOUS | Status: DC | PRN
  Administered 2017-07-31: 10 mL
  Filled 2017-07-31: qty 10

## 2017-07-31 MED ORDER — MELOXICAM 7.5 MG PO TABS: 7.5000 mg | ORAL_TABLET | Freq: Every day | ORAL | 1 refills | Status: DC | PRN

## 2017-07-31 MED ORDER — HEPARIN SOD (PORK) LOCK FLUSH 100 UNIT/ML IV SOLN
INTRAVENOUS | Status: AC
  Filled 2017-07-31: qty 5

## 2017-07-31 MED ORDER — HEPARIN SOD (PORK) LOCK FLUSH 100 UNIT/ML IV SOLN
500.0000 [IU] | Freq: Once | INTRAVENOUS | Status: AC | PRN
  Administered 2017-07-31: 500 [IU]

## 2017-07-31 MED ORDER — SODIUM CHLORIDE 0.9 % IV SOLN
Freq: Once | INTRAVENOUS | Status: AC
  Administered 2017-07-31: 11:00:00 via INTRAVENOUS

## 2017-07-31 MED ORDER — DURVALUMAB 500 MG/10ML IV SOLN
740.0000 mg | Freq: Once | INTRAVENOUS | Status: AC
  Administered 2017-07-31: 740 mg via INTRAVENOUS
  Filled 2017-07-31: qty 4.8

## 2017-07-31 NOTE — Progress Notes (Signed)
[place] reviewed with and pt seen by Kirby Crigler PA and pt approved for Imfinzi infusion today per PA.                                                                            [place] tolerated Imfinzi infusion well without complaints or incident. VSS upon discharge. Pt discharged self ambulatory in satisfactory condition accompanied by his wife

## 2017-07-31 NOTE — Patient Instructions (Signed)
[facility] Discharge Instructions for Patients Receiving Chemotherapy   Beginning January 23rd 2017 lab work for the [facility] will be done in the  Main lab at [facility] on 1st floor. If you have a lab appointment with the [facility] please come in thru the  Main Entrance and check in at the main information desk   Today you received the following chemotherapy agents Imfinzi. Follow-up as scheduled. Call clinic for any questions or concerns  To help prevent nausea and vomiting after your treatment, we encourage you to take your nausea medication   If you develop nausea and vomiting, or diarrhea that is not controlled by your medication, call the clinic.  The clinic phone number is [phone]. Office hours are Monday-Friday 8:30am-5:00pm.  BELOW ARE SYMPTOMS THAT SHOULD BE REPORTED IMMEDIATELY:  *FEVER GREATER THAN 101.0 F  *CHILLS WITH OR WITHOUT FEVER  NAUSEA AND VOMITING THAT IS NOT CONTROLLED WITH YOUR NAUSEA MEDICATION  *UNUSUAL SHORTNESS OF BREATH  *UNUSUAL BRUISING OR BLEEDING  TENDERNESS IN MOUTH AND THROAT WITH OR WITHOUT PRESENCE OF ULCERS  *URINARY PROBLEMS  *BOWEL PROBLEMS  UNUSUAL RASH Items with * indicate a potential emergency and should be followed up as soon as possible. If you have an emergency after office hours please contact your primary care physician or go to the nearest emergency department.  Please call the clinic during office hours if you have any questions or concerns.   You may also contact the Patient Navigator at [phone] should you have any questions or need assistance in obtaining follow up care.      Resources For Cancer Patients and their Caregivers ? American Cancer Society: Can assist with transportation, wigs, general needs, runs Look Good Feel Better.        769 235 8261 ? Cancer Care: Provides financial assistance, online support groups, medication/co-pay assistance.  1-800-813-HOPE  305-562-7817) ? [place] Assists [place] cancer patients and their families through emotional , educational and financial support.  6146714500 ? [place] DSS Where to apply for food stamps, Medicaid and utility assistance. 651-561-3409 ? [employer]: Transportation to medical appointments. (647) [phone] ? Social Security Administration: May apply for disability if have a Stage IV cancer. 408-012-7571 231-377-2845 ? [employer], Disability and Transit Services: Assists with nutrition, care and transit needs. 587-873-2637

## 2017-07-31 NOTE — Progress Notes (Signed)
t       John Euler, MD [place] [id number]  Non-small cell lung cancer, right [facility]) - Plan: DISCONTINUED: sodium chloride flush (NS) 0.9 % injection 10 mL, DISCONTINUED: heparin lock flush 100 unit/mL, DISCONTINUED: 0.9 %  sodium chloride infusion  Pulmonary nodule, left  Type 2 diabetes mellitus with diabetic polyneuropathy, without long-term current use of insulin (HCC)  Panlobular emphysema (HCC)  Essential hypertension  Hyponatremia  Hypothyroidism, unspecified type  Bilateral sciatica  Constipation, unspecified constipation type   HISTORY OF PRESENT ILLNESS: Stage IIIA (cT1B(2)cN2cM0) NSCLC, favoring adenocarcinoma with initial PET staging scan demonstrating 2 RUL pulmonary nodules (measuring 1.7 and 2.0 cm) and ipsilateral paratracheal lymphadenopathy (mediastinal adenopathy).  S/P bronchoscopy on 05/28/2016. LUL pulmonary nodule NOT-biopsy-proven but suspicious on imaging.  He is S/O chemoXRT with weekly carboplatin/paclitaxel and XRT (finishing on 08/20/2016) and is currently on consolidative durvalumab therapy beginning on 09/26/2016 (for up to 52 weeks).  Restaging CT scan on 07/28/2017 demonstrated no new or progressive interval findings, similar appearance of nodular density in the medial right upper lobe posteriorly with surrounding postradiation fibrosis, and pulmonary nodules in the left upper and lower lobes are stable.  CURRENT THERAPY: Consolidative Imfinzi beginning on 09/26/2016.  CURRENT STATUS: John [name] 72 y.o. male returns for followup of NSCLC, currently on consolidative therapy with durvalumab.  He is tolerating therapy well.  He denies any new lumps or bumps nodes on examination.  He denies any new cough or hemoptysis.  He denies any new neurological complaints including headaches, dizziness, double vision, LOC, and seizure.  He admits to intermittent nausea without vomiting that is well controlled with home antiemetics.  He admits  to constipation and shortness of breath on exertion only.  He does have some numbness and tingling in his feet and hands which is likely secondary to his diabetes.  He reports a left leg radiculopathy which seems to be secondary to sciatica.  His appetite is at 75% and his energy level is a 50%.  Review of Systems  Constitutional: Positive for malaise/fatigue. Negative for chills, fever and weight loss.  HENT: Negative.   Eyes: Negative.   Respiratory: Positive for shortness of breath. Negative for cough.   Cardiovascular: Negative.  Negative for chest pain.  Gastrointestinal: Positive for constipation and nausea. Negative for blood in stool, diarrhea, melena and vomiting.  Genitourinary: Negative.   Musculoskeletal: Negative.   Skin: Negative.   Neurological: Positive for sensory change. Negative for weakness.  Endo/Heme/Allergies: Bruises/bleeds easily.  Psychiatric/Behavioral: Negative.     Past Medical History:  Diagnosis Date  . Adenocarcinoma of lung, right ([place]) 04/18/2016  . Anxiety   . Arthritis   . CHF (congestive heart failure) ([place])   . COPD (chronic obstructive pulmonary disease) ([place])   . Depression   . Diabetes mellitus without complication (HCC)    no meds  . DM (diabetes mellitus) ([place]) 07/09/2016  . Dyspnea   . History of kidney stones   . Hyperlipidemia   . Hypertension   . Macular degeneration   . Neuropathy   . Non-small cell lung cancer, right ([place]) 04/18/2016  . Prostatitis   . Pulmonary nodule, left 07/16/2016  . Sleep apnea    cpap    Past Surgical History:  Procedure Laterality Date  . CATARACT EXTRACTION, BILATERAL Bilateral   . NO PAST SURGERIES    . PORTACATH PLACEMENT Left 06/13/2016   Procedure: INSERTION PORT-A-CATH;  Surgeon: Aviva Signs, MD;  Location: [facility];  Service: General;  Laterality: Left;  Marland Kitchen VIDEO BRONCHOSCOPY WITH ENDOBRONCHIAL NAVIGATION N/A 05/28/2016   Procedure: VIDEO BRONCHOSCOPY WITH ENDOBRONCHIAL NAVIGATION;  Surgeon: [name], MD;  Location: [facility];  Service: Thoracic;  Laterality: N/A;  . VIDEO BRONCHOSCOPY WITH ENDOBRONCHIAL ULTRASOUND N/A 05/28/2016   Procedure: VIDEO BRONCHOSCOPY WITH ENDOBRONCHIAL ULTRASOUND;  Surgeon: [name], MD;  Location: [facility];  Service: Thoracic;  Laterality: N/A;    Family History  Problem Relation Age of Onset  . Hypertension Mother   . Diabetes Father   . Heart disease Father   . Stroke Father   . Hypertension Sister     Social History   Socioeconomic History  . Marital status: Divorced    Spouse name: Not on file  . Number of children: 5  . Years of education: 10  . Highest education level: 10th grade  Occupational History  . Occupation: Retired  Scientific laboratory technician  . Financial resource strain: Not very hard  . Food insecurity:    Worry: Never true    Inability: Never true  . Transportation needs:    Medical: No    Non-medical: No  Tobacco Use  . Smoking status: Current Every Day Smoker    Packs/day: 0.10    Years: 55.00    Pack years: 5.50    Types: Cigarettes    Start date: 03/11/1961  . Smokeless tobacco: Never Used  . Tobacco comment: smoking 1 to 2 cigarettes per day  Substance and Sexual Activity  . Alcohol use: No  . Drug use: No  . Sexual activity: Yes    Birth control/protection: None  Lifestyle  . Physical activity:    Days per week: 0 days    Minutes per session: 0 min  . Stress: To some extent  Relationships  . Social connections:    Talks on phone: More than three times a week    Gets together: More than three times a week    Attends religious service: Never    Active member of club or organization: No    Attends meetings of clubs or organizations: Never    Relationship status: Divorced  Other Topics Concern  . Not on file  Social History Narrative  . Not on file     PHYSICAL EXAMINATION  ECOG PERFORMANCE STATUS: 1 - Symptomatic but completely ambulatory  Vitals today demonstrate a blood pressure of 142/69,  pulse 97, respirations 20, temperature 98 F, and oxygen saturation 97% on room air.  GENERAL:alert, no distress, well nourished, well developed, comfortable, cooperative and smiling SKIN: skin color, texture, turgor are normal, no rashes or significant lesions HEAD: Normocephalic, No masses, lesions, tenderness or abnormalities EYES: normal, EOMI, Conjunctiva are pink and non-injected EARS: External ears normal OROPHARYNX:lips, buccal mucosa, and tongue normal and mucous membranes are moist  NECK: supple, no adenopathy, trachea midline LYMPH:  no palpable lymphadenopathy BREAST:not examined LUNGS: clear to auscultation and percussion HEART: regular rate & rhythm, no murmurs, no gallops, S1 normal and S2 normal ABDOMEN:abdomen soft, non-tender and normal bowel sounds BACK: Back symmetric, no curvature., No CVA tenderness EXTREMITIES:less then 2 second capillary refill, no joint deformities, effusion, or inflammation, no skin discoloration, no cyanosis  NEURO: alert & oriented x 3 with fluent speech, no focal motor/sensory deficits, gait normal   LABORATORY DATA: CBC    Component Value Date/Time   WBC 8.7 07/31/2017 0945   RBC 3.99 (L) 07/31/2017 0945   HGB 13.0 07/31/2017 0945   HGB 9.6 (L) 08/28/2016  1117   HCT 39.6 07/31/2017 0945   HCT 27.7 (L) 08/28/2016 1117   PLT 300 07/31/2017 0945   PLT 268 08/28/2016 1117   MCV 99.2 07/31/2017 0945   MCV 96 08/28/2016 1117   MCH 32.6 07/31/2017 0945   MCHC 32.8 07/31/2017 0945   RDW 15.0 07/31/2017 0945   RDW 19.4 (H) 08/28/2016 1117   LYMPHSABS 0.5 (L) 07/31/2017 0945   LYMPHSABS 0.6 (L) 08/28/2016 1117   MONOABS 0.9 07/31/2017 0945   EOSABS 0.1 07/31/2017 0945   EOSABS 0.0 08/28/2016 1117   BASOSABS 0.0 07/31/2017 0945   BASOSABS 0.0 08/28/2016 1117      Chemistry      Component Value Date/Time   NA 139 07/31/2017 0945   NA 139 08/28/2016 1117   K 3.6 07/31/2017 0945   CL 100 (L) 07/31/2017 0945   CO2 27 07/31/2017 0945    BUN 21 (H) 07/31/2017 0945   BUN 15 08/28/2016 1117   CREATININE 0.94 07/31/2017 0945      Component Value Date/Time   CALCIUM 9.2 07/31/2017 0945   ALKPHOS 76 07/31/2017 0945   AST 19 07/31/2017 0945   ALT 17 07/31/2017 0945   BILITOT 0.5 07/31/2017 0945   BILITOT 0.5 08/28/2016 1117       RADIOGRAPHIC STUDIES:  Ct Chest W Contrast  Result Date: 07/29/2017 CLINICAL DATA:  Non-small-cell lung cancer. EXAM: CT CHEST WITH CONTRAST TECHNIQUE: Multidetector CT imaging of the chest was performed during intravenous contrast administration. CONTRAST:  63mL ISOVUE-300 IOPAMIDOL (ISOVUE-300) INJECTION 61% COMPARISON:  04/21/2017 FINDINGS: Cardiovascular: Heart size normal. Trace pericardial effusion is similar to prior. Coronary artery calcification is evident. Atherosclerotic calcification is noted in the wall of the thoracic aorta. Left Port-A-Cath tip is in the mid to distal SVC. Mediastinum/Nodes: Scattered small mediastinal lymph nodes are similar. No mediastinal lymphadenopathy. Abnormal soft tissue attenuation in the right hilum is similar as is the 10 mm short axis right hilar lymph node. The esophagus has normal imaging features. There is no axillary lymphadenopathy. Lungs/Pleura: Nodular lesion in the medial right upper lobe (image 42/series 4) is stable, measuring 16 x 9 mm today compared to 16 x 9 mm previously. Interstitial and airspace opacity in the right parahilar and medial lung is compatible with evolving radiation fibrosis. Stable 9 mm nodule in the left lung apex. 8 mm nodule left lower lobe (84/4) is unchanged. No new or enlarging pulmonary nodule or mass. No focal airspace consolidation. No pulmonary edema or pleural effusion. Upper Abdomen: Similar thickening of the adrenal glands bilaterally. Musculoskeletal: Bone windows reveal no worrisome lytic or sclerotic osseous lesions. IMPRESSION: 1. No new or progressive interval findings. 2. Similar appearance of the nodular density in  the medial right upper lobe posteriorly with surrounding post radiation fibrosis. 3. Pulmonary nodules in the left upper and lower lobes are stable in the interval. 4.  Aortic Atherosclerois (ICD10-170.0) Electronically Signed   By: Misty Stanley M.D.   On: 07/29/2017 11:46     PATHOLOGY:    ASSESSMENT AND PLAN:  Non-small cell lung cancer, right (HCC) Stage IIIA (cT1B(2)cN2cM0) NSCLC, favoring adenocarcinoma with initial PET staging scan demonstrating 2 RUL pulmonary nodules (measuring 1.7 and 2.0 cm) and ipsilateral paratracheal lymphadenopathy (mediastinal adenopathy).  S/P bronchoscopy on 05/28/2016. LUL pulmonary nodule NOT-biopsy-proven but suspicious on imaging.  He is S/O chemoXRT with weekly carboplatin/paclitaxel and XRT (finishing on 08/20/2016) and is currently on consolidative durvalumab therapy beginning on 09/26/2016 (for up to 52 weeks).  Restaging CT  scan on 07/28/2017 demonstrated no new or progressive interval findings, similar appearance of nodular density in the medial right upper lobe posteriorly with surrounding postradiation fibrosis, and pulmonary nodules in the left upper and lower lobes are stable.  Labs today: CBC diff, CMET, TSH.  I personally reviewed and went over laboratory results with the patient.  The results are noted within this dictation.  Lab work today demonstrates a white blood cell count of 8.7, hemoglobin 13.0 g/dL, 300K, and lymphopenia at 0.5.  Glucose is noted to be 200 and some indication of minimal dehydration with a HMC:NOBSJGGEZM > 20.   Today is O2H47.  I personally reviewed and went over radiographic studies with the patient.  The results are noted within this dictation.  I personally reviewed the images in PACS.  Restaging CT scan on 07/28/2017 demonstrates stability of disease without any new or progressive findings.  Return in 2 weeks to embark on cycle #23.  Return in 4 weeks for follow-up with ongoing immunotherapy.  We will need to be on the  look-out for pneumonitis given his past history of radiation therapy and is now on currently on immunotherapy, as there is a higher incidence of pneumonitis in the bi-modality treatment and further compounded by ongoing immunotherapy.  2. Pulmonary nodule, left Stable on CT imaging  3. Type 2 diabetes mellitus with diabetic polyneuropathy, without long-term current use of insulin (HCC) Hyperglycemia noted today at 200.  Will defer management to primary care provider  4. Panlobular emphysema ([place]) Secondary to his history of tobacco abuse.  5. Essential hypertension Blood pressure today is above goal at 142/69.  Will defer management to primary care provider.  6. Hyponatremia Sodium within normal limit 139 today.  7. Hypothyroidism, unspecified type TSH minimally elevated at 4.5 today.  This is stable.  At this time, will continue with same dose of levothyroxine.  8. Bilateral sciatica Negative straight leg test.  I have recommended over-the-counter anti-inflammatory medication and stretching.  9. Constipation, unspecified constipation type I provided bowel regimen recommendations including MiraLAX and Senokot-S.   ORDERS PLACED FOR THIS ENCOUNTER: No orders of the defined types were placed in this encounter.   MEDICATIONS PRESCRIBED THIS ENCOUNTER: Meds ordered this encounter  Medications  . meloxicam (MOBIC) 7.5 MG tablet    Sig: Take 1 tablet (7.5 mg total) by mouth daily as needed for pain (back pain).    Dispense:  30 tablet    Refill:  1    Order Specific Question:   Supervising Provider    Answer:   Derek Jack [[id number]]    THERAPY PLAN:  Continue durvalumab therapy to complete 52 weeks worth of consolidative therapy.  All questions were answered. The patient knows to call the clinic with any problems, questions or concerns. We can certainly see the patient much sooner if necessary.  Patient and plan discussed with Dr. Derek Jack and she is in  agreement with the aforementioned.   This note is electronically signed by: Robynn Pane, PA-C 07/31/2017 4:20 PM

## 2017-07-31 NOTE — Assessment & Plan Note (Addendum)
Stage IIIA (cT1B(2)cN2cM0) NSCLC, favoring adenocarcinoma with initial PET staging scan demonstrating 2 RUL pulmonary nodules (measuring 1.7 and 2.0 cm) and ipsilateral paratracheal lymphadenopathy (mediastinal adenopathy).  S/P bronchoscopy on 05/28/2016. LUL pulmonary nodule NOT-biopsy-proven but suspicious on imaging.  He is S/O chemoXRT with weekly carboplatin/paclitaxel and XRT (finishing on 08/20/2016) and is currently on consolidative durvalumab therapy beginning on 09/26/2016 (for up to 52 weeks).  Restaging CT scan on 07/28/2017 demonstrated no new or progressive interval findings, similar appearance of nodular density in the medial right upper lobe posteriorly with surrounding postradiation fibrosis, and pulmonary nodules in the left upper and lower lobes are stable.  Labs today: CBC diff, CMET, TSH.  I personally reviewed and went over laboratory results with the patient.  The results are noted within this dictation.  Lab work today demonstrates a white blood cell count of 8.7, hemoglobin 13.0 g/dL, 300K, and lymphopenia at 0.5.  Glucose is noted to be 200 and some indication of minimal dehydration with a CYE:LYHTMBPJPE > 20.   Today is T6K44.  I personally reviewed and went over radiographic studies with the patient.  The results are noted within this dictation.  I personally reviewed the images in PACS.  Restaging CT scan on 07/28/2017 demonstrates stability of disease without any new or progressive findings.  Return in 2 weeks to embark on cycle #23.  Return in 4 weeks for follow-up with ongoing immunotherapy.  We will need to be on the look-out for pneumonitis given his past history of radiation therapy and is now on currently on immunotherapy, as there is a higher incidence of pneumonitis in the bi-modality treatment and further compounded by ongoing immunotherapy.

## 2017-07-31 NOTE — Patient Instructions (Addendum)
[place] at [facility] Discharge Instructions  Most recent CT scan is very good and stable.  We will continue to monitor with scans in the future.  You will complete your chemotherapy in June 2019.  We will continue with therapy as planned every 2 weeks.  Return in 1 month for follow-up.  Prescription for [facility]am [facility]       Thank you for choosing [place] at [facility] to provide your oncology and hematology care.  To afford each patient quality time with our provider, please arrive at least 15 minutes before your scheduled appointment time.   If you have a lab appointment with the [facility] please come in thru the  Main Entrance and check in at the main information desk  You need to re-schedule your appointment should you arrive 10 or more minutes late.  We strive to give you quality time with our providers, and arriving late affects you and other patients whose appointments are after yours.  Also, if you no show three or more times for appointments you may be dismissed from the clinic at the providers discretion.     Again, thank you for choosing [facility].  Our hope is that these requests will decrease the amount of time that you wait before being seen by our physicians.       _____________________________________________________________  Should you have questions after your visit to [facility], please contact our office at [phone] between the hours of 8:30 a.m. and 4:30 p.m.  Voicemails left after 4:30 p.m. will not be returned until the following business day.  For prescription refill requests, have your pharmacy contact our office.       Resources For Cancer Patients and their Caregivers ? American Cancer Society: Can assist with transportation, wigs, general needs, runs Look Good Feel Better.        971-259-6616 ? Cancer Care: Provides financial assistance,  online support groups, medication/co-pay assistance.  1-800-813-HOPE 249-424-5316) ? [place] Assists [place] cancer patients and their families through emotional , educational and financial support.  (260)475-3231 ? [place] DSS Where to apply for food stamps, Medicaid and utility assistance. (667) 831-8914 ? [facility]: Transportation to medical appointments. 386-769-4465 ? Social Security Administration: May apply for disability if have a Stage IV cancer. (548)806-6862 564 296 2657 ? [employer], Disability and Transit Services: Assists with nutrition, care and transit needs. [place] Support Programs:   > Cancer Support Group  2nd Tuesday of the month 1pm-2pm, Journey Room   > Creative Journey  3rd Tuesday of the month 1130am-1pm, Journey Room

## 2017-08-14 ENCOUNTER — Inpatient Hospital Stay (HOSPITAL_COMMUNITY): Payer: Medicare Other | Attending: Oncology

## 2017-08-14 ENCOUNTER — Inpatient Hospital Stay (HOSPITAL_COMMUNITY): Payer: Medicare Other

## 2017-08-14 VITALS — BP 150/64 | HR 98 | Temp 97.4°F | Resp 16 | Wt 180.0 lb

## 2017-08-14 DIAGNOSIS — R5383 Other fatigue: Secondary | ICD-10-CM | POA: Diagnosis not present

## 2017-08-14 DIAGNOSIS — C3411 Malignant neoplasm of upper lobe, right bronchus or lung: Secondary | ICD-10-CM | POA: Diagnosis not present

## 2017-08-14 DIAGNOSIS — Z5112 Encounter for antineoplastic immunotherapy: Secondary | ICD-10-CM | POA: Diagnosis not present

## 2017-08-14 DIAGNOSIS — E039 Hypothyroidism, unspecified: Secondary | ICD-10-CM | POA: Diagnosis not present

## 2017-08-14 DIAGNOSIS — C3491 Malignant neoplasm of unspecified part of right bronchus or lung: Secondary | ICD-10-CM

## 2017-08-14 LAB — LACTATE DEHYDROGENASE: LDH: 149 U/L (ref 98–192)

## 2017-08-14 LAB — COMPREHENSIVE METABOLIC PANEL
ALT: 14 U/L — ABNORMAL LOW (ref 17–63)
ANION GAP: 8 (ref 5–15)
AST: 18 U/L (ref 15–41)
Albumin: 3.5 g/dL (ref 3.5–5.0)
Alkaline Phosphatase: 74 U/L (ref 38–126)
BILIRUBIN TOTAL: 0.4 mg/dL (ref 0.3–1.2)
BUN: 19 mg/dL (ref 6–20)
CHLORIDE: 101 mmol/L (ref 101–111)
CO2: 30 mmol/L (ref 22–32)
Calcium: 9.3 mg/dL (ref 8.9–10.3)
Creatinine, Ser: 0.84 mg/dL (ref 0.61–1.24)
Glucose, Bld: 137 mg/dL — ABNORMAL HIGH (ref 65–99)
POTASSIUM: 4 mmol/L (ref 3.5–5.1)
Sodium: 139 mmol/L (ref 135–145)
TOTAL PROTEIN: 7.6 g/dL (ref 6.5–8.1)

## 2017-08-14 LAB — CBC WITH DIFFERENTIAL/PLATELET
BASOS ABS: 0 10*3/uL (ref 0.0–0.1)
BASOS PCT: 0 %
EOS ABS: 0.2 10*3/uL (ref 0.0–0.7)
Eosinophils Relative: 3 %
HEMATOCRIT: 38.7 % — AB (ref 39.0–52.0)
Hemoglobin: 12.8 g/dL — ABNORMAL LOW (ref 13.0–17.0)
Lymphocytes Relative: 13 %
Lymphs Abs: 0.9 10*3/uL (ref 0.7–4.0)
MCH: 32.9 pg (ref 26.0–34.0)
MCHC: 33.1 g/dL (ref 30.0–36.0)
MCV: 99.5 fL (ref 78.0–100.0)
MONOS PCT: 11 %
Monocytes Absolute: 0.7 10*3/uL (ref 0.1–1.0)
NEUTROS ABS: 4.8 10*3/uL (ref 1.7–7.7)
Neutrophils Relative %: 73 %
PLATELETS: 333 10*3/uL (ref 150–400)
RBC: 3.89 MIL/uL — AB (ref 4.22–5.81)
RDW: 14.9 % (ref 11.5–15.5)
WBC: 6.6 10*3/uL (ref 4.0–10.5)

## 2017-08-14 LAB — TSH: TSH: 5.967 u[IU]/mL — ABNORMAL HIGH (ref 0.350–4.500)

## 2017-08-14 MED ORDER — HEPARIN SOD (PORK) LOCK FLUSH 100 UNIT/ML IV SOLN
500.0000 [IU] | Freq: Once | INTRAVENOUS | Status: AC | PRN
  Administered 2017-08-14: 500 [IU]

## 2017-08-14 MED ORDER — HEPARIN SOD (PORK) LOCK FLUSH 100 UNIT/ML IV SOLN
INTRAVENOUS | Status: AC
  Filled 2017-08-14: qty 5

## 2017-08-14 MED ORDER — SODIUM CHLORIDE 0.9 % IV SOLN
740.0000 mg | Freq: Once | INTRAVENOUS | Status: AC
  Administered 2017-08-14: 740 mg via INTRAVENOUS
  Filled 2017-08-14: qty 10

## 2017-08-14 MED ORDER — SODIUM CHLORIDE 0.9 % IV SOLN
Freq: Once | INTRAVENOUS | Status: AC
  Administered 2017-08-14: 13:00:00 via INTRAVENOUS

## 2017-08-14 MED ORDER — SODIUM CHLORIDE 0.9% FLUSH
10.0000 mL | INTRAVENOUS | Status: DC | PRN
  Administered 2017-08-14: 10 mL
  Filled 2017-08-14: qty 10

## 2017-08-14 NOTE — Progress Notes (Signed)
Patient stated he was bitten by a tick last week and ran a fever Monday thru Wednesday of this week.  Patient did not check his thermometer but stated he had fevers and chills.  Left leg with tick bite that is scabbed over but no drainage or heat to the area.  Reviewed with Dr. Delton Coombes and ok to treat today.  No s/s of distress noted.    Patient tolerated treatment with no complaints of pain voiced.  Port site clean and dry with no bruising or swelling noted at site.  Good blood return noted before and after administration of treatment.  Band aid applied.  VSS with discharge and left ambulatory with wife with no s/s of distress noted.

## 2017-08-14 NOTE — Patient Instructions (Signed)
[facility] Discharge Instructions for Patients Receiving Chemotherapy  Today you received the following chemotherapy agents imfinzi.    If you develop nausea and vomiting that is not controlled by your nausea medication, call the clinic.   BELOW ARE SYMPTOMS THAT SHOULD BE REPORTED IMMEDIATELY:  *FEVER GREATER THAN 100.5 F  *CHILLS WITH OR WITHOUT FEVER  NAUSEA AND VOMITING THAT IS NOT CONTROLLED WITH YOUR NAUSEA MEDICATION  *UNUSUAL SHORTNESS OF BREATH  *UNUSUAL BRUISING OR BLEEDING  TENDERNESS IN MOUTH AND THROAT WITH OR WITHOUT PRESENCE OF ULCERS  *URINARY PROBLEMS  *BOWEL PROBLEMS  UNUSUAL RASH Items with * indicate a potential emergency and should be followed up as soon as possible.  Feel free to call the clinic should you have any questions or concerns. The clinic phone number is [phone].  Please show the [facility] at check-in to the Emergency Department and triage nurse.

## 2017-08-28 ENCOUNTER — Inpatient Hospital Stay (HOSPITAL_COMMUNITY): Payer: Medicare Other

## 2017-08-28 ENCOUNTER — Other Ambulatory Visit: Payer: Self-pay

## 2017-08-28 ENCOUNTER — Encounter (HOSPITAL_COMMUNITY): Payer: Self-pay | Admitting: Hematology

## 2017-08-28 ENCOUNTER — Inpatient Hospital Stay (HOSPITAL_BASED_OUTPATIENT_CLINIC_OR_DEPARTMENT_OTHER): Payer: Medicare Other | Admitting: Hematology

## 2017-08-28 ENCOUNTER — Encounter (HOSPITAL_COMMUNITY): Payer: Self-pay

## 2017-08-28 VITALS — BP 136/63 | HR 94 | Temp 97.7°F | Resp 18 | Wt 180.6 lb

## 2017-08-28 DIAGNOSIS — C3411 Malignant neoplasm of upper lobe, right bronchus or lung: Secondary | ICD-10-CM | POA: Diagnosis not present

## 2017-08-28 DIAGNOSIS — E039 Hypothyroidism, unspecified: Secondary | ICD-10-CM | POA: Diagnosis not present

## 2017-08-28 DIAGNOSIS — R5383 Other fatigue: Secondary | ICD-10-CM

## 2017-08-28 DIAGNOSIS — Z5112 Encounter for antineoplastic immunotherapy: Secondary | ICD-10-CM | POA: Diagnosis not present

## 2017-08-28 DIAGNOSIS — C3491 Malignant neoplasm of unspecified part of right bronchus or lung: Secondary | ICD-10-CM

## 2017-08-28 LAB — CBC WITH DIFFERENTIAL/PLATELET
Basophils Absolute: 0 10*3/uL (ref 0.0–0.1)
Basophils Relative: 0 %
Eosinophils Absolute: 0.2 10*3/uL (ref 0.0–0.7)
Eosinophils Relative: 2 %
HEMATOCRIT: 39.2 % (ref 39.0–52.0)
HEMOGLOBIN: 13.1 g/dL (ref 13.0–17.0)
LYMPHS ABS: 0.6 10*3/uL — AB (ref 0.7–4.0)
LYMPHS PCT: 9 %
MCH: 33.3 pg (ref 26.0–34.0)
MCHC: 33.4 g/dL (ref 30.0–36.0)
MCV: 99.7 fL (ref 78.0–100.0)
MONOS PCT: 13 %
Monocytes Absolute: 0.9 10*3/uL (ref 0.1–1.0)
NEUTROS ABS: 5 10*3/uL (ref 1.7–7.7)
NEUTROS PCT: 76 %
Platelets: 333 10*3/uL (ref 150–400)
RBC: 3.93 MIL/uL — ABNORMAL LOW (ref 4.22–5.81)
RDW: 14.4 % (ref 11.5–15.5)
WBC: 6.6 10*3/uL (ref 4.0–10.5)

## 2017-08-28 LAB — COMPREHENSIVE METABOLIC PANEL
ALK PHOS: 75 U/L (ref 38–126)
ALT: 18 U/L (ref 17–63)
AST: 19 U/L (ref 15–41)
Albumin: 3.5 g/dL (ref 3.5–5.0)
Anion gap: 7 (ref 5–15)
BILIRUBIN TOTAL: 0.4 mg/dL (ref 0.3–1.2)
BUN: 22 mg/dL — AB (ref 6–20)
CALCIUM: 9.1 mg/dL (ref 8.9–10.3)
CO2: 29 mmol/L (ref 22–32)
CREATININE: 1.04 mg/dL (ref 0.61–1.24)
Chloride: 102 mmol/L (ref 101–111)
Glucose, Bld: 170 mg/dL — ABNORMAL HIGH (ref 65–99)
Potassium: 3.8 mmol/L (ref 3.5–5.1)
Sodium: 138 mmol/L (ref 135–145)
Total Protein: 6.8 g/dL (ref 6.5–8.1)

## 2017-08-28 LAB — LACTATE DEHYDROGENASE: LDH: 150 U/L (ref 98–192)

## 2017-08-28 LAB — TSH: TSH: 3.85 u[IU]/mL (ref 0.350–4.500)

## 2017-08-28 MED ORDER — SODIUM CHLORIDE 0.9% FLUSH
10.0000 mL | INTRAVENOUS | Status: DC | PRN
  Administered 2017-08-28: 10 mL
  Filled 2017-08-28: qty 10

## 2017-08-28 MED ORDER — SODIUM CHLORIDE 0.9 % IV SOLN
Freq: Once | INTRAVENOUS | Status: AC
  Administered 2017-08-28: 12:00:00 via INTRAVENOUS

## 2017-08-28 MED ORDER — SODIUM CHLORIDE 0.9 % IV SOLN
740.0000 mg | Freq: Once | INTRAVENOUS | Status: AC
  Administered 2017-08-28: 740 mg via INTRAVENOUS
  Filled 2017-08-28: qty 4.8

## 2017-08-28 MED ORDER — HEPARIN SOD (PORK) LOCK FLUSH 100 UNIT/ML IV SOLN
500.0000 [IU] | Freq: Once | INTRAVENOUS | Status: AC | PRN
  Administered 2017-08-28: 500 [IU]

## 2017-08-28 NOTE — Progress Notes (Signed)
[place], [place] [address]   CLINIC:  Medical Oncology/Hematology  PCP:  [name], MD [place] [address] [phone]   REASON FOR VISIT:  Follow-up for lung cancer.  CURRENT THERAPY: Durvalumab every 2 weeks.  BRIEF ONCOLOGIC HISTORY:    Non-small cell lung cancer, right ([place])   04/17/2016 Imaging    CT chest- 1. Examination is positive for bilateral upper lobe pulmonary lesions suspicious for malignancy. Further investigation with PET-CT and tissue sampling is advised. 2. Borderline enlarged paratracheal lymph nodes and right hilar nodes. Cannot rule out metastatic adenopathy. Attention to these areas on PET-CT is advised. 3. Emphysema 4. Aortic atherosclerosis and multi vessel coronary artery calcification.      04/18/2016 Initial Diagnosis    Adenocarcinoma of lung, right ([place] [place])      04/24/2016 PET scan    1. Adjacent hypermetabolic nodules in the RIGHT upper lobe consistent bronchogenic carcinoma. 2. Suspicion of ipsilateral hilar and paratracheal nodal metastasis. 3. Nodule in the LEFT upper lobe with suspicious morphology has a relatively low metabolic activity for size. Favor synchronous primary bronchogenic carcinoma. 4. No metabolic activity associated with a small LEFT lower lobe pulmonary nodule. Recommend attention on follow-up.      05/06/2016 Imaging    MRI brain- Negative for metastatic disease.      05/28/2016 Procedure    1. Electromagnetic navigational bronchoscopy with brushings, needle     aspirations and biopsies of bilateral upper lobe nodules. 2. Endobronchial ultrasound with mediastinal and hilar lymph node     aspirations. By Dr. [name]      06/03/2016 Pathology Results    Diagnosis 1. Lung, biopsy, Right upper lobe - BLOOD AND BENIGN BRONCHIAL AND LUNG TISSUE. 2. Lung, biopsy, Left upper lobe - BLOOD AND BENIGN BRONCHIAL AND LUNG TISSUE.      06/03/2016  Pathology Results    Diagnosis TRANSBRONCHIAL NEEDLE ASPIRATION, NAVIGATION, RUL (SPECIMEN 5 OF 7, COLLECTED 05/28/16): MALIGNANT CELLS CONSISTENT WITH NON-SMALL CELL CARCINOMA Comment There are limited tumor cells in the cell block (insufficient for molecular testing). TTF-1 is positive in a few of the atypical cells. They appear negative for synaptophysin and cytokeratin 5/6. Overall there is limited tumor, but a lung adenocarcinoma is slightly favored. Dr. [name] has reviewed the case. The case was called to Dr. [name] on 06/03/2016.      06/13/2016 Procedure    Port placed by Dr. Arnoldo Morale      06/25/2016 - 08/15/2016 Chemotherapy    The patient had palonosetron (ALOXI) injection 0.25 mg, 0.25 mg, Intravenous,  Once, 6 of 6 cycles  CARBOplatin (PARAPLATIN) 220 mg in sodium chloride 0.9 % 250 mL chemo infusion, 220 mg (100 % of original dose 215.8 mg), Intravenous,  Once, 6 of 6 cycles Dose modification:   (original dose 215.8 mg, Cycle 1),   (original dose 215.8 mg, Cycle 5),   (original dose 215.8 mg, Cycle 6),   (original dose 215.8 mg, Cycle 2),   (original dose 215.8 mg, Cycle 3),   (original dose 215.8 mg, Cycle 4)  PACLitaxel (TAXOL) 90 mg in dextrose 5 % 250 mL chemo infusion ( for chemotherapy treatment.        06/25/2016 - 08/20/2016 Radiation Therapy    Radiation in [place], [place] by Dr. Quitman Livings.  R lung to 6600 cGy      07/09/2016 - 07/10/2016 Hospital Admission    Admit date: 07/09/2016  Admission diagnosis: Fever and chills Additional  comments: Negative work-up, discharged with course of Levaquin      07/09/2016 Treatment Plan Change    Treatment deferred x 1 week due to hospitalization       08/05/2016 - 08/06/2016 Hospital Admission    Admit date: 08/05/2016 Admission diagnosis: Joint pain Additional comments: Treated with steroids with symptomatic improvement      08/25/2016 Imaging    MRI brain- Negative for intracranial metastasis on this motion  degraded examination.  Stable mild-to-moderate chronic small vessel ischemic disease.      09/18/2016 PET scan    1. Partial metabolic response. Right upper lobe 2.6 cm hypermetabolic pulmonary nodule, decreased in size and metabolism. Mildly hypermetabolic right hilar nodal metastasis, decreased in metabolism. Right paratracheal lymph node is no longer hypermetabolic. No new or progressive metastatic disease. 2. Apical left upper lobe 0.9 cm solid pulmonary nodule, slightly decreased in size, with no significant metabolism. The change in size could indicate a neoplastic etiology for this nodule. 3. Additional subcentimeter pulmonary nodules in the left lung are stable and below PET resolution . 4. Additional findings include aortic atherosclerosis, coronary atherosclerosis, moderate emphysema, stable small pericardial effusion/thickening and mild sigmoid diverticulosis.      01/13/2017 Imaging    CT chest with contrast IMPRESSION: 1. No change in size of the hypermetabolic nodular thickening in the RIGHT upper lobe. 2. LEFT upper lobe spiculated nodule is stable. 3. Rounded LEFT lower lobe pulmonary nodule is stable. 4. No evidence of lung cancer progression.      05/08/2017 Adverse Reaction    Development of Hypothyroidism- likely secondary to immunotherapy.  Levothyroxine therapy initiated.         CANCER STAGING: Cancer Staging Non-small cell lung cancer, right [facility]) Staging form: Lung, AJCC 8th Edition - Clinical stage from 06/05/2016: Stage IIIA (cT1b(2), cN2, cM0) - Signed by Baird Cancer, PA-C on 08/21/2016    INTERVAL HISTORY:  John [name] 72 y.o. male returns for routine follow-up and consideration for next cycle of immunotherapy.    Due for next cycle of Imfinzi today. He will receive 2 additional treatments after today to complete 1 year of therapy.   Reports 2 recent tick bites to his leg and foot. Denies any bullseye rash.  Denies diarrhea.  His  shortness of breath and cough are not worse; they are stable. Denies headaches or double vision. He has blurry vision, but he attributes this to his diabetes.  He has some peripheral neuropathy to his fingertips; it is difficult for him to button his shirts and tie his shoes. Gabapentin does help his neuropathy.      REVIEW OF SYSTEMS:  Review of Systems  Constitutional: Positive for fatigue.  Respiratory: Positive for shortness of breath.   All other systems reviewed and are negative.    PAST MEDICAL/SURGICAL HISTORY:  Past Medical History:  Diagnosis Date  . Adenocarcinoma of lung, right ([facility]) 04/18/2016  . Anxiety   . Arthritis   . CHF (congestive heart failure) ([place])   . COPD (chronic obstructive pulmonary disease) ([facility])   . Depression   . Diabetes mellitus without complication ([facility])    no meds  . DM (diabetes mellitus) ([place]) 07/09/2016  . Dyspnea   . History of kidney stones   . Hyperlipidemia   . Hypertension   . Macular degeneration   . Neuropathy   . Non-small cell lung cancer, right ([facility]) 04/18/2016  . Prostatitis   . Pulmonary nodule, left 07/16/2016  . Sleep apnea    cpap  Past Surgical History:  Procedure Laterality Date  . CATARACT EXTRACTION, BILATERAL Bilateral   . NO PAST SURGERIES    . PORTACATH PLACEMENT Left 06/13/2016   Procedure: INSERTION PORT-A-CATH;  Surgeon: Aviva Signs, MD;  Location: [facility];  Service: General;  Laterality: Left;  Marland Kitchen VIDEO BRONCHOSCOPY WITH ENDOBRONCHIAL NAVIGATION N/A 05/28/2016   Procedure: VIDEO BRONCHOSCOPY WITH ENDOBRONCHIAL NAVIGATION;  Surgeon: [name], MD;  Location: [facility];  Service: Thoracic;  Laterality: N/A;  . VIDEO BRONCHOSCOPY WITH ENDOBRONCHIAL ULTRASOUND N/A 05/28/2016   Procedure: VIDEO BRONCHOSCOPY WITH ENDOBRONCHIAL ULTRASOUND;  Surgeon: [name], MD;  Location: [facility];  Service: Thoracic;  Laterality: N/A;     SOCIAL HISTORY:  Social History   Socioeconomic History  . Marital status:  Divorced    Spouse name: Not on file  . Number of children: 5  . Years of education: 10  . Highest education level: 10th grade  Occupational History  . Occupation: Retired  Scientific laboratory technician  . Financial resource strain: Not very hard  . Food insecurity:    Worry: Never true    Inability: Never true  . Transportation needs:    Medical: No    Non-medical: No  Tobacco Use  . Smoking status: Current Every Day Smoker    Packs/day: 0.10    Years: 55.00    Pack years: 5.50    Types: Cigarettes    Start date: 03/11/1961  . Smokeless tobacco: Never Used  . Tobacco comment: smoking 1 to 2 cigarettes per day  Substance and Sexual Activity  . Alcohol use: No  . Drug use: No  . Sexual activity: Yes    Birth control/protection: None  Lifestyle  . Physical activity:    Days per week: 0 days    Minutes per session: 0 min  . Stress: To some extent  Relationships  . Social connections:    Talks on phone: More than three times a week    Gets together: More than three times a week    Attends religious service: Never    Active member of club or organization: No    Attends meetings of clubs or organizations: Never    Relationship status: Divorced  . Intimate partner violence:    Fear of current or ex partner: Not on file    Emotionally abused: Not on file    Physically abused: Not on file    Forced sexual activity: Not on file  Other Topics Concern  . Not on file  Social History Narrative  . Not on file    FAMILY HISTORY:  Family History  Problem Relation Age of Onset  . Hypertension Mother   . Diabetes Father   . Heart disease Father   . Stroke Father   . Hypertension Sister     CURRENT MEDICATIONS:  Outpatient Encounter Medications as of 08/28/2017  Medication Sig Note  . acetaminophen (TYLENOL) 325 MG tablet Take 2 tablets (650 mg total) by mouth every 6 (six) hours as needed for mild pain (or Fever >/= 101).   Marland Kitchen albuterol (PROVENTIL HFA;VENTOLIN HFA) 108 (90 Base) MCG/ACT  inhaler Inhale 2 puffs into the lungs every 6 (six) hours as needed for wheezing or shortness of breath.   Marland Kitchen aspirin EC 81 MG tablet Take 81 mg by mouth daily.   . cloNIDine (CATAPRES) 0.1 MG tablet Take 1 tablet (0.1 mg total) by mouth 3 (three) times daily.   [place] (IMFINZI IV) Inject into the vein. Every 2 weeks   .  dutasteride (AVODART) 0.5 MG capsule Take 1 capsule (0.5 mg total) by mouth daily. (Patient not taking: Reported on 08/28/2017)   . finasteride (PROSCAR) 5 MG tablet TAKE 1 TABLET BY MOUTH EVERY DAY (Patient not taking: Reported on 08/28/2017)   . fluticasone (FLONASE) 50 MCG/ACT nasal spray Place 2 sprays into both nostrils daily. 12/05/2016: As needed  . Fluticasone-Salmeterol (ADVAIR DISKUS IN) Inhale 1 puff into the lungs 2 (two) times daily. 12/05/2016: As needed  . gabapentin (NEURONTIN) 300 MG capsule Take 1 capsule in Am and 2 at night   . ibuprofen (ADVIL,MOTRIN) 600 MG tablet Take 1 tablet (600 mg total) by mouth every 6 (six) hours as needed. (Patient not taking: Reported on 08/28/2017)   . INCRUSE ELLIPTA 62.5 MCG/INH AEPB TAKE 1 PUFF BY MOUTH EVERY DAY   . irbesartan-hydrochlorothiazide (AVALIDE) 300-12.5 MG tablet TAKE 1 TABLET BY MOUTH EVERY DAY (Patient not taking: Reported on 08/28/2017)   . KLOR-CON M20 20 MEQ tablet TAKE 1 TABLET BY MOUTH EVERY DAY (Patient not taking: Reported on 08/28/2017)   . levothyroxine (SYNTHROID, LEVOTHROID) 25 MCG tablet TAKE 1 TABLET (25 MCG TOTAL) BY MOUTH DAILY BEFORE BREAKFAST.   Marland Kitchen lisinopril-hydrochlorothiazide (PRINZIDE,ZESTORETIC) 20-25 MG tablet Take 1 tablet by mouth daily.   Marland Kitchen LORazepam (ATIVAN) 0.5 MG tablet TAKE 1 TABLET BY MOUTH TWICE DAILY AS NEEDED (Patient not taking: Reported on 08/28/2017)   . magic mouthwash w/lidocaine SOLN USE 1 TEASPOONFUL BY 15MINS BEFORE MEALS AND AS NEEDED FOR [facility] AND SWALLOW 12/05/2016: As needed  . meloxicam (MOBIC) 7.5 MG tablet Take 1 tablet (7.5 mg total) by mouth daily as needed  for pain (back pain).   . metoprolol tartrate (LOPRESSOR) 25 MG tablet Take 1 tablet (25 mg total) by mouth 2 (two) times daily. (Patient not taking: Reported on 08/28/2017)   . omeprazole (PRILOSEC) 20 MG capsule Take 20 mg by mouth daily.    . ondansetron (ZOFRAN) 8 MG tablet Take 1 tablet (8 mg total) by mouth every 8 (eight) hours as needed for nausea or vomiting. (Patient not taking: Reported on 08/28/2017)   . pravastatin (PRAVACHOL) 40 MG tablet Take 40 mg by mouth every 3 (three) days. M-W-F   . prochlorperazine (COMPAZINE) 10 MG tablet TAKE 1 TABLET (10 MG TOTAL) BY MOUTH EVERY 6 (SIX) HOURS AS NEEDED FOR NAUSEA OR VOMITING.   . tamsulosin (FLOMAX) 0.4 MG CAPS capsule TAKE 2 CAPSULES (0.8 MG TOTAL) BY MOUTH DAILY.   Marland Kitchen TRADJENTA 5 MG TABS tablet TAKE 1 TABLET BY MOUTH EVERY DAY   . traZODone (DESYREL) 50 MG tablet Take 1 tablet (50 mg total) by mouth at bedtime as needed for sleep. (Patient not taking: Reported on 08/28/2017)    No facility-administered encounter medications on file as of 08/28/2017.     ALLERGIES:  No Known Allergies   PHYSICAL EXAM:  ECOG Performance status: 1  Vitals were reviewed by me. Chest is bilaterally clear to auscultation.  Skin has no rashes.  LABORATORY DATA:  I have reviewed the labs as listed.  CBC    Component Value Date/Time   WBC 6.6 08/28/2017 0929   RBC 3.93 (L) 08/28/2017 0929   HGB 13.1 08/28/2017 0929   HGB 9.6 (L) 08/28/2016 1117   HCT 39.2 08/28/2017 0929   HCT 27.7 (L) 08/28/2016 1117   PLT 333 08/28/2017 0929   PLT 268 08/28/2016 1117   MCV 99.7 08/28/2017 0929   MCV 96 08/28/2016 1117   MCH 33.3 08/28/2017 0929  MCHC 33.4 08/28/2017 0929   RDW 14.4 08/28/2017 0929   RDW 19.4 (H) 08/28/2016 1117   LYMPHSABS 0.6 (L) 08/28/2017 0929   LYMPHSABS 0.6 (L) 08/28/2016 1117   MONOABS 0.9 08/28/2017 0929   EOSABS 0.2 08/28/2017 0929   EOSABS 0.0 08/28/2016 1117   BASOSABS 0.0 08/28/2017 0929   BASOSABS 0.0 08/28/2016 1117   CMP  Latest Ref Rng & Units 08/28/2017 08/14/2017 07/31/2017  Glucose 65 - 99 mg/dL 170(H) 137(H) 200(H)  BUN 6 - 20 mg/dL 22(H) 19 21(H)  Creatinine 0.61 - 1.24 mg/dL 1.04 0.84 0.94  Sodium 135 - 145 mmol/L 138 139 139  Potassium 3.5 - 5.1 mmol/L 3.8 4.0 3.6  Chloride 101 - 111 mmol/L 102 101 100(L)  CO2 22 - 32 mmol/L 29 30 27   Calcium 8.9 - 10.3 mg/dL 9.1 9.3 9.2  Total Protein 6.5 - 8.1 g/dL 6.8 7.6 7.0  Total Bilirubin 0.3 - 1.2 mg/dL 0.4 0.4 0.5  Alkaline Phos 38 - 126 U/L 75 74 76  AST 15 - 41 U/L 19 18 19   ALT 17 - 63 U/L 18 14(L) 17       DIAGNOSTIC IMAGING:  I have reviewed his previous scans and agree with the reports.     ASSESSMENT & PLAN:   Non-small cell lung cancer, right ([facility]) 1.Stage IIIA (cT1BcN2cM0) NSCLC, favoring adenocarcinoma: Scan at diagnosis showed 2 right upper lobe pulmonary nodules measuring 1.7 and 2.0 cm, ipsilateral mediastinal adenopathy, status post bronchoscopy on 05/28/2016, left upper lobe pulmonary nodule not biopsy proven but suspicious with imaging.  He finished chemoradiation therapy with weekly carboplatin and paclitaxel on 08/20/2016.  Consolidation Durvalumab was started on 09/26/2016. -CT scan on 07/28/2017 shows stable disease.  He will proceed with cycle 24 today.  We will give him a total of 26 cycles.  I plan to rescan his chest and of July and see him back after that.  We will do follow-up visits with chest CT once every 3 to 6 months for 3 years, followed by H&P and chest CT every 6 months for 2 years.  2.  Fatigue: He reports worsening of his fatigue when he started Lopressor 25 mg twice daily which was prescribed by his cardiologist for tachycardia.  He took it for 1 week and stopped.  He still continues to feel tired.  I have asked him to move around and do some exercises during the daytime.  He also uses CPAP for many years for his sleep apnea.  3.  Hypothyroidism: He is currently taking Synthroid 25 mcg daily.  His TSH today was normal at  3.8.        Orders placed this encounter:  Orders Placed This Encounter  Procedures  . CT Chest W Contrast  . CBC with Differential/Platelet  . Comprehensive metabolic panel  . TSH      Derek Jack, [place] [phone]

## 2017-08-28 NOTE — Progress Notes (Signed)
Patient seen and examined by Dr. Tera Helper today , labs reviewed, proceed with treatment

## 2017-08-28 NOTE — Assessment & Plan Note (Signed)
1.Stage IIIA (cT1BcN2cM0) NSCLC, favoring adenocarcinoma: Scan at diagnosis showed 2 right upper lobe pulmonary nodules measuring 1.7 and 2.0 cm, ipsilateral mediastinal adenopathy, status post bronchoscopy on 05/28/2016, left upper lobe pulmonary nodule not biopsy proven but suspicious with imaging.  He finished chemoradiation therapy with weekly carboplatin and paclitaxel on 08/20/2016.  Consolidation Durvalumab was started on 09/26/2016. -CT scan on 07/28/2017 shows stable disease.  He will proceed with cycle 24 today.  We will give him a total of 26 cycles.  I plan to rescan his chest and of July and see him back after that.  We will do follow-up visits with chest CT once every 3 to 6 months for 3 years, followed by H&P and chest CT every 6 months for 2 years.  2.  Fatigue: He reports worsening of his fatigue when he started Lopressor 25 mg twice daily which was prescribed by his cardiologist for tachycardia.  He took it for 1 week and stopped.  He still continues to feel tired.  I have asked him to move around and do some exercises during the daytime.  He also uses CPAP for many years for his sleep apnea.  3.  Hypothyroidism: He is currently taking Synthroid 25 mcg daily.  His TSH today was normal at 3.8.

## 2017-08-28 NOTE — Patient Instructions (Signed)
[facility] Discharge Instructions for Patients Receiving Chemotherapy   Beginning January 23rd 2017 lab work for the Cancer Center will be done in the  Main lab at  on 1st floor. If you have a lab appointment with the Cancer Center please come in thru the  Main Entrance and check in at the main information desk   Today you received the following chemotherapy agents   To help prevent nausea and vomiting after your treatment, we encourage you to take your nausea medication     If you develop nausea and vomiting, or diarrhea that is not controlled by your medication, call the clinic.  The clinic phone number is [phone]. Office hours are Monday-Friday 8:30am-5:00pm.  BELOW ARE SYMPTOMS THAT SHOULD BE REPORTED IMMEDIATELY:  *FEVER GREATER THAN 101.0 F  *CHILLS WITH OR WITHOUT FEVER  NAUSEA AND VOMITING THAT IS NOT CONTROLLED WITH YOUR NAUSEA MEDICATION  *UNUSUAL SHORTNESS OF BREATH  *UNUSUAL BRUISING OR BLEEDING  TENDERNESS IN MOUTH AND THROAT WITH OR WITHOUT PRESENCE OF ULCERS  *URINARY PROBLEMS  *BOWEL PROBLEMS  UNUSUAL RASH Items with * indicate a potential emergency and should be followed up as soon as possible. If you have an emergency after office hours please contact your primary care physician or go to the nearest emergency department.  Please call the clinic during office hours if you have any questions or concerns.   You may also contact the Patient Navigator at [phone] should you have any questions or need assistance in obtaining follow up care.      Resources For Cancer Patients and their Caregivers ? American Cancer Society: Can assist with transportation, wigs, general needs, runs Look Good Feel Better.        1-888-227-6333 ? Cancer Care: Provides financial assistance, online support groups, medication/co-pay assistance.  1-800-813-HOPE (4673) ? [facility] Assists [place] cancer  patients and their families through emotional , educational and financial support.  336-427-4357 ? [place] DSS Where to apply for food stamps, Medicaid and utility assistance. 336-342-1394 ? [facility]: Transportation to medical appointments. 336-347-2287 ? Social Security Administration: May apply for disability if have a Stage IV cancer. 336-342-7796 1-800-772-1213 ? [place] Aging, Disability and Transit Services: Assists with nutrition, care and transit needs. 336-349-2343         

## 2017-09-10 ENCOUNTER — Encounter: Payer: Self-pay | Admitting: Family Medicine

## 2017-09-10 ENCOUNTER — Ambulatory Visit (INDEPENDENT_AMBULATORY_CARE_PROVIDER_SITE_OTHER): Payer: Medicare Other | Admitting: Family Medicine

## 2017-09-10 VITALS — BP 149/84 | HR 98 | Temp 97.6°F | Ht 66.5 in | Wt 182.4 lb

## 2017-09-10 DIAGNOSIS — G5700 Lesion of sciatic nerve, unspecified lower limb: Secondary | ICD-10-CM

## 2017-09-10 DIAGNOSIS — I10 Essential (primary) hypertension: Secondary | ICD-10-CM | POA: Diagnosis not present

## 2017-09-10 DIAGNOSIS — E1142 Type 2 diabetes mellitus with diabetic polyneuropathy: Secondary | ICD-10-CM | POA: Diagnosis not present

## 2017-09-10 LAB — LIPID PANEL
CHOL/HDL RATIO: 7.8 ratio — AB (ref 0.0–5.0)
Cholesterol, Total: 194 mg/dL (ref 100–199)
HDL: 25 mg/dL — AB (ref >39)
LDL CALC: 97 mg/dL (ref 0–99)
Triglycerides: 362 mg/dL — ABNORMAL HIGH (ref 0–149)
VLDL CHOLESTEROL CAL: 72 mg/dL — AB (ref 5–40)

## 2017-09-10 LAB — BAYER DCA HB A1C WAIVED: HB A1C: 7.1 % — AB (ref <7.0)

## 2017-09-10 MED ORDER — LINAGLIPTIN 5 MG PO TABS: 5.0000 mg | ORAL_TABLET | Freq: Every day | ORAL | 3 refills | Status: DC

## 2017-09-10 NOTE — Patient Instructions (Signed)
Great to see you!  Try the stretches, come back to see Dr. Evette Doffing in 3 months

## 2017-09-10 NOTE — Progress Notes (Signed)
   HPI  Patient presents today back pain for follow-up chronic medical conditions.  Patient currently has non-small cell lung cancer and is undergoing treatment.  Patient reports about 1 month of bilateral buttock pain with radiation to his bilateral popliteal fossa.  No back pain.  2 diabetes Random blood sugar rarely 150s Good medication compliance and tolerance. Watching diet moderately.  Hypertension Patient was recently started on metoprolol, he states that he did not tolerate this due to low heart rate and severely decreased energy, he has stopped the medication.  PMH: Smoking status noted ROS: Per HPI  Objective: BP (!) 149/84   Pulse 98   Temp 97.6 F (36.4 C) (Oral)   Ht 5' 6.5" (1.689 m)   Wt 182 lb 6.4 oz (82.7 kg)   BMI 29.00 kg/m  Gen: NAD, alert, cooperative with exam HEENT: NCAT CV: RRR, good S1/S2, no murmur Resp: CTABL, no wheezes, non-labored Ext: No edema, warm Neuro: Alert and oriented, No gross deficits MSK:  No midline tenderness or paraspinal tenderness in the lumbar area, Area of concern is described as bilateral buttock pain with radiation to the popliteal fossa  Assessment and plan:  #Piriformis syndrome Most likely diagnosis, however I did discuss with him 2 weeks of conservative therapy and return if not improving.  Considering current treatment for cancer I would recommend MRI early  #Hypertension Slightly elevated today No changes for now Did not tolerate low-dose metoprolol #Type 2 diabetes Controlled No changes    Orders Placed This Encounter  Procedures  . Bayer DCA Hb A1c Waived  . Microalbumin / creatinine urine ratio  . Lipid panel    Meds ordered this encounter  Medications  . linagliptin (TRADJENTA) 5 MG TABS tablet    Sig: Take 1 tablet (5 mg total) by mouth daily.    Dispense:  90 tablet    Refill:  [place], MD [place] 09/10/2017, 9:47 AM

## 2017-09-11 ENCOUNTER — Other Ambulatory Visit (HOSPITAL_COMMUNITY): Payer: Medicare Other

## 2017-09-11 ENCOUNTER — Inpatient Hospital Stay (HOSPITAL_COMMUNITY): Payer: Medicare Other | Attending: Oncology

## 2017-09-11 ENCOUNTER — Encounter (HOSPITAL_COMMUNITY): Payer: Self-pay

## 2017-09-11 ENCOUNTER — Inpatient Hospital Stay (HOSPITAL_COMMUNITY): Payer: Medicare Other

## 2017-09-11 VITALS — BP 124/61 | HR 98 | Temp 97.8°F | Resp 20

## 2017-09-11 DIAGNOSIS — Z79899 Other long term (current) drug therapy: Secondary | ICD-10-CM | POA: Diagnosis not present

## 2017-09-11 DIAGNOSIS — C3491 Malignant neoplasm of unspecified part of right bronchus or lung: Secondary | ICD-10-CM

## 2017-09-11 DIAGNOSIS — Z5112 Encounter for antineoplastic immunotherapy: Secondary | ICD-10-CM | POA: Insufficient documentation

## 2017-09-11 DIAGNOSIS — C3411 Malignant neoplasm of upper lobe, right bronchus or lung: Secondary | ICD-10-CM | POA: Insufficient documentation

## 2017-09-11 DIAGNOSIS — E1142 Type 2 diabetes mellitus with diabetic polyneuropathy: Secondary | ICD-10-CM | POA: Diagnosis not present

## 2017-09-11 LAB — COMPREHENSIVE METABOLIC PANEL
ALBUMIN: 3.9 g/dL (ref 3.5–5.0)
ALK PHOS: 74 U/L (ref 38–126)
ALT: 15 U/L — ABNORMAL LOW (ref 17–63)
ANION GAP: 9 (ref 5–15)
AST: 18 U/L (ref 15–41)
BILIRUBIN TOTAL: 0.6 mg/dL (ref 0.3–1.2)
BUN: 17 mg/dL (ref 6–20)
CALCIUM: 9.3 mg/dL (ref 8.9–10.3)
CO2: 29 mmol/L (ref 22–32)
Chloride: 100 mmol/L — ABNORMAL LOW (ref 101–111)
Creatinine, Ser: 0.83 mg/dL (ref 0.61–1.24)
GFR calc non Af Amer: >60 mL/min (ref >60)
Glucose, Bld: 144 mg/dL — ABNORMAL HIGH (ref 65–99)
POTASSIUM: 3.8 mmol/L (ref 3.5–5.1)
Sodium: 138 mmol/L (ref 135–145)
TOTAL PROTEIN: 7.4 g/dL (ref 6.5–8.1)

## 2017-09-11 LAB — CBC WITH DIFFERENTIAL/PLATELET
BASOS PCT: 0 %
Basophils Absolute: 0 10*3/uL (ref 0.0–0.1)
Eosinophils Absolute: 0.2 10*3/uL (ref 0.0–0.7)
Eosinophils Relative: 3 %
HEMATOCRIT: 41 % (ref 39.0–52.0)
HEMOGLOBIN: 14 g/dL (ref 13.0–17.0)
Lymphocytes Relative: 11 %
Lymphs Abs: 0.9 10*3/uL (ref 0.7–4.0)
MCH: 33.7 pg (ref 26.0–34.0)
MCHC: 34.1 g/dL (ref 30.0–36.0)
MCV: 98.6 fL (ref 78.0–100.0)
MONO ABS: 0.9 10*3/uL (ref 0.1–1.0)
MONOS PCT: 10 %
NEUTROS ABS: 6.3 10*3/uL (ref 1.7–7.7)
NEUTROS PCT: 76 %
Platelets: 337 10*3/uL (ref 150–400)
RBC: 4.16 MIL/uL — ABNORMAL LOW (ref 4.22–5.81)
RDW: 14.8 % (ref 11.5–15.5)
WBC: 8.2 10*3/uL (ref 4.0–10.5)

## 2017-09-11 LAB — TSH: TSH: 4.644 u[IU]/mL — ABNORMAL HIGH (ref 0.350–4.500)

## 2017-09-11 MED ORDER — SODIUM CHLORIDE 0.9 % IV SOLN
740.0000 mg | Freq: Once | INTRAVENOUS | Status: AC
  Administered 2017-09-11: 740 mg via INTRAVENOUS
  Filled 2017-09-11: qty 14.8

## 2017-09-11 MED ORDER — HEPARIN SOD (PORK) LOCK FLUSH 100 UNIT/ML IV SOLN
INTRAVENOUS | Status: AC
  Filled 2017-09-11: qty 5

## 2017-09-11 MED ORDER — SODIUM CHLORIDE 0.9% FLUSH
10.0000 mL | INTRAVENOUS | Status: DC | PRN
  Administered 2017-09-11: 10 mL
  Filled 2017-09-11: qty 10

## 2017-09-11 MED ORDER — SODIUM CHLORIDE 0.9 % IV SOLN
Freq: Once | INTRAVENOUS | Status: AC
  Administered 2017-09-11: 14:00:00 via INTRAVENOUS

## 2017-09-11 MED ORDER — HEPARIN SOD (PORK) LOCK FLUSH 100 UNIT/ML IV SOLN
500.0000 [IU] | Freq: Once | INTRAVENOUS | Status: AC | PRN
  Administered 2017-09-11: 500 [IU]

## 2017-09-11 NOTE — Patient Instructions (Signed)
[facility] Discharge Instructions for Patients Receiving Chemotherapy   Beginning January 23rd 2017 lab work for the [facility] will be done in the  Main lab at [facility] on 1st floor. If you have a lab appointment with the [facility] please come in thru the  Main Entrance and check in at the main information desk   Today you received the following chemotherapy agents Imfinzi. Follow-up as scheduled. Call clinic for any questions or concerns  To help prevent nausea and vomiting after your treatment, we encourage you to take your nausea medication   If you develop nausea and vomiting, or diarrhea that is not controlled by your medication, call the clinic.  The clinic phone number is [phone]. Office hours are Monday-Friday 8:30am-5:00pm.  BELOW ARE SYMPTOMS THAT SHOULD BE REPORTED IMMEDIATELY:  *FEVER GREATER THAN 101.0 F  *CHILLS WITH OR WITHOUT FEVER  NAUSEA AND VOMITING THAT IS NOT CONTROLLED WITH YOUR NAUSEA MEDICATION  *UNUSUAL SHORTNESS OF BREATH  *UNUSUAL BRUISING OR BLEEDING  TENDERNESS IN MOUTH AND THROAT WITH OR WITHOUT PRESENCE OF ULCERS  *URINARY PROBLEMS  *BOWEL PROBLEMS  UNUSUAL RASH Items with * indicate a potential emergency and should be followed up as soon as possible. If you have an emergency after office hours please contact your primary care physician or go to the nearest emergency department.  Please call the clinic during office hours if you have any questions or concerns.   You may also contact the Patient Navigator at [phone] should you have any questions or need assistance in obtaining follow up care.      Resources For Cancer Patients and their Caregivers ? American Cancer Society: Can assist with transportation, wigs, general needs, runs Look Good Feel Better.        769 235 8261 ? Cancer Care: Provides financial assistance, online support groups, medication/co-pay assistance.  1-800-813-HOPE  305-562-7817) ? [place] Assists [place] cancer patients and their families through emotional , educational and financial support.  6146714500 ? [place] DSS Where to apply for food stamps, Medicaid and utility assistance. 651-561-3409 ? [employer]: Transportation to medical appointments. (647) [phone] ? Social Security Administration: May apply for disability if have a Stage IV cancer. 408-012-7571 231-377-2845 ? [employer], Disability and Transit Services: Assists with nutrition, care and transit needs. 587-873-2637

## 2017-09-11 NOTE — Progress Notes (Signed)
[place] reviewed with Dr. Delton Coombes and pt approved for Imfinzi infusion today per MD                    John [name] tolerated Imfinzi infusion well without complaints or incident. VSS upon discharge. Pt discharged self ambulatory in satisfactory condition accompanied by his wife

## 2017-09-12 LAB — MICROALBUMIN / CREATININE URINE RATIO
Creatinine, Urine: 23 mg/dL
MICROALB/CREAT RATIO: 112.2 mg/g{creat} — AB (ref 0.0–30.0)
MICROALBUM., U, RANDOM: 25.8 ug/mL

## 2017-09-16 ENCOUNTER — Telehealth: Payer: Self-pay | Admitting: Family Medicine

## 2017-09-16 ENCOUNTER — Other Ambulatory Visit: Payer: Self-pay | Admitting: Family Medicine

## 2017-09-16 NOTE — Telephone Encounter (Signed)
Aware of results. 

## 2017-09-24 ENCOUNTER — Other Ambulatory Visit (HOSPITAL_COMMUNITY): Payer: Self-pay

## 2017-09-24 DIAGNOSIS — C3491 Malignant neoplasm of unspecified part of right bronchus or lung: Secondary | ICD-10-CM

## 2017-09-24 DIAGNOSIS — Z5112 Encounter for antineoplastic immunotherapy: Secondary | ICD-10-CM

## 2017-09-24 DIAGNOSIS — J431 Panlobular emphysema: Secondary | ICD-10-CM

## 2017-09-25 ENCOUNTER — Inpatient Hospital Stay (HOSPITAL_COMMUNITY): Payer: Medicare Other

## 2017-09-25 ENCOUNTER — Other Ambulatory Visit (HOSPITAL_COMMUNITY): Payer: Medicare Other

## 2017-09-25 VITALS — BP 140/60 | HR 102 | Temp 97.9°F | Resp 18 | Wt 182.8 lb

## 2017-09-25 DIAGNOSIS — Z79899 Other long term (current) drug therapy: Secondary | ICD-10-CM | POA: Diagnosis not present

## 2017-09-25 DIAGNOSIS — C3491 Malignant neoplasm of unspecified part of right bronchus or lung: Secondary | ICD-10-CM

## 2017-09-25 DIAGNOSIS — J431 Panlobular emphysema: Secondary | ICD-10-CM

## 2017-09-25 DIAGNOSIS — Z5112 Encounter for antineoplastic immunotherapy: Secondary | ICD-10-CM

## 2017-09-25 DIAGNOSIS — C3411 Malignant neoplasm of upper lobe, right bronchus or lung: Secondary | ICD-10-CM | POA: Diagnosis not present

## 2017-09-25 LAB — COMPREHENSIVE METABOLIC PANEL
ALK PHOS: 75 U/L (ref 38–126)
ALT: 15 U/L — ABNORMAL LOW (ref 17–63)
ANION GAP: 9 (ref 5–15)
AST: 17 U/L (ref 15–41)
Albumin: 3.7 g/dL (ref 3.5–5.0)
BILIRUBIN TOTAL: 0.2 mg/dL — AB (ref 0.3–1.2)
BUN: 19 mg/dL (ref 6–20)
CALCIUM: 9 mg/dL (ref 8.9–10.3)
CO2: 27 mmol/L (ref 22–32)
Chloride: 101 mmol/L (ref 101–111)
Creatinine, Ser: 0.91 mg/dL (ref 0.61–1.24)
GFR calc non Af Amer: >60 mL/min (ref >60)
Glucose, Bld: 187 mg/dL — ABNORMAL HIGH (ref 65–99)
POTASSIUM: 3.8 mmol/L (ref 3.5–5.1)
Sodium: 137 mmol/L (ref 135–145)
Total Protein: 7.3 g/dL (ref 6.5–8.1)

## 2017-09-25 LAB — CBC WITH DIFFERENTIAL/PLATELET
Basophils Absolute: 0 10*3/uL (ref 0.0–0.1)
Basophils Relative: 0 %
EOS ABS: 0.2 10*3/uL (ref 0.0–0.7)
Eosinophils Relative: 3 %
HEMATOCRIT: 39.4 % (ref 39.0–52.0)
HEMOGLOBIN: 13.2 g/dL (ref 13.0–17.0)
LYMPHS ABS: 0.9 10*3/uL (ref 0.7–4.0)
Lymphocytes Relative: 11 %
MCH: 33.7 pg (ref 26.0–34.0)
MCHC: 33.5 g/dL (ref 30.0–36.0)
MCV: 100.5 fL — ABNORMAL HIGH (ref 78.0–100.0)
MONOS PCT: 11 %
Monocytes Absolute: 0.9 10*3/uL (ref 0.1–1.0)
NEUTROS PCT: 75 %
Neutro Abs: 5.9 10*3/uL (ref 1.7–7.7)
Platelets: 302 10*3/uL (ref 150–400)
RBC: 3.92 MIL/uL — ABNORMAL LOW (ref 4.22–5.81)
RDW: 14.8 % (ref 11.5–15.5)
WBC: 7.9 10*3/uL (ref 4.0–10.5)

## 2017-09-25 LAB — LACTATE DEHYDROGENASE: LDH: 155 U/L (ref 98–192)

## 2017-09-25 MED ORDER — SODIUM CHLORIDE 0.9 % IV SOLN
Freq: Once | INTRAVENOUS | Status: AC
  Administered 2017-09-25: 11:00:00 via INTRAVENOUS

## 2017-09-25 MED ORDER — HEPARIN SOD (PORK) LOCK FLUSH 100 UNIT/ML IV SOLN
500.0000 [IU] | Freq: Once | INTRAVENOUS | Status: AC | PRN
  Administered 2017-09-25: 500 [IU]

## 2017-09-25 MED ORDER — SODIUM CHLORIDE 0.9 % IV SOLN
740.0000 mg | Freq: Once | INTRAVENOUS | Status: AC
  Administered 2017-09-25: 740 mg via INTRAVENOUS
  Filled 2017-09-25: qty 10

## 2017-09-25 NOTE — Progress Notes (Signed)
Tolerated infusion w/o adverse reaction.  Alert, in no distress.  VSS.  Discharged ambulatory in c/o spouse.  

## 2017-10-14 ENCOUNTER — Ambulatory Visit (INDEPENDENT_AMBULATORY_CARE_PROVIDER_SITE_OTHER): Payer: Medicare Other | Admitting: Cardiovascular Disease

## 2017-10-14 ENCOUNTER — Encounter: Payer: Self-pay | Admitting: Cardiovascular Disease

## 2017-10-14 VITALS — BP 148/78 | HR 101 | Ht 66.0 in | Wt 182.0 lb

## 2017-10-14 DIAGNOSIS — E7800 Pure hypercholesterolemia, unspecified: Secondary | ICD-10-CM

## 2017-10-14 DIAGNOSIS — I10 Essential (primary) hypertension: Secondary | ICD-10-CM

## 2017-10-14 DIAGNOSIS — Z720 Tobacco use: Secondary | ICD-10-CM | POA: Diagnosis not present

## 2017-10-14 DIAGNOSIS — G4730 Sleep apnea, unspecified: Secondary | ICD-10-CM | POA: Diagnosis not present

## 2017-10-14 DIAGNOSIS — I3139 Other pericardial effusion (noninflammatory): Secondary | ICD-10-CM

## 2017-10-14 DIAGNOSIS — C3491 Malignant neoplasm of unspecified part of right bronchus or lung: Secondary | ICD-10-CM

## 2017-10-14 DIAGNOSIS — R0609 Other forms of dyspnea: Secondary | ICD-10-CM | POA: Diagnosis not present

## 2017-10-14 DIAGNOSIS — I313 Pericardial effusion (noninflammatory): Secondary | ICD-10-CM | POA: Diagnosis not present

## 2017-10-14 DIAGNOSIS — R079 Chest pain, unspecified: Secondary | ICD-10-CM

## 2017-10-14 MED ORDER — ISOSORBIDE MONONITRATE ER 30 MG PO TB24: 30.0000 mg | ORAL_TABLET | Freq: Every day | ORAL | 6 refills | Status: DC

## 2017-10-14 NOTE — Progress Notes (Signed)
SUBJECTIVE: The patient presents for routine follow-up.  He has stage IIIa non-small cell lung cancer.  He saw his oncologist on 08/28/2017.  He reported worsening fatigue when he started metoprolol 25 mg twice daily.  He took it for a week and then stopped it.  Limited echocardiogram on 07/09/2017 demonstrated normal left ventricular systolic function and regional wall motion, LVEF 60 to 65%.  There was a trivial pericardial effusion.  He denies chest pain.  He underwent a low risk nuclear stress test in January 2018 with a very small amount of inferior ischemia.  He continues to have exertional dyspnea.  He has advanced COPD.  Shortness of breath is worse in the hot, humid weather we have been experiencing with temperatures in the 90s.  He uses CPAP for sleep apnea and wonders if his machine is working properly.    Review of Systems: As per "subjective", otherwise negative.  No Known Allergies  Current Outpatient Medications  Medication Sig Dispense Refill  . albuterol (PROVENTIL HFA;VENTOLIN HFA) 108 (90 Base) MCG/ACT inhaler Inhale 2 puffs into the lungs every 6 (six) hours as needed for wheezing or shortness of breath. 1 Inhaler 2  . levothyroxine (SYNTHROID, LEVOTHROID) 25 MCG tablet TAKE 1 TABLET (25 MCG TOTAL) BY MOUTH DAILY BEFORE BREAKFAST. 30 tablet 1  . LORazepam (ATIVAN) 0.5 MG tablet TAKE 1 TABLET BY MOUTH TWICE DAILY AS NEEDED 30 tablet 2  . magic mouthwash w/lidocaine SOLN USE 1 TEASPOONFUL BY 15MINS BEFORE MEALS AND AS NEEDED FOR DISCOMFORT,SWISH AND SWALLOW  1  . meloxicam (MOBIC) 7.5 MG tablet Take 1 tablet (7.5 mg total) by mouth daily as needed for pain (back pain). 30 tablet 1  . ondansetron (ZOFRAN) 8 MG tablet Take 1 tablet (8 mg total) by mouth every 8 (eight) hours as needed for nausea or vomiting. 60 tablet 1  . pravastatin (PRAVACHOL) 40 MG tablet Take 40 mg by mouth every 3 (three) days. M-W-F    . prochlorperazine (COMPAZINE) 10 MG tablet TAKE 1 TABLET (10  MG TOTAL) BY MOUTH EVERY 6 (SIX) HOURS AS NEEDED FOR NAUSEA OR VOMITING. 60 tablet 2  . tamsulosin (FLOMAX) 0.4 MG CAPS capsule TAKE 2 CAPSULES (0.8 MG TOTAL) BY MOUTH DAILY. 180 capsule 3  . traZODone (DESYREL) 50 MG tablet Take 1 tablet (50 mg total) by mouth at bedtime as needed for sleep. 30 tablet 0  . aspirin EC 81 MG tablet Take 81 mg by mouth daily.    . cloNIDine (CATAPRES) 0.1 MG tablet Take 1 tablet (0.1 mg total) by mouth 3 (three) times daily. 90 tablet 11  . Durvalumab (IMFINZI IV) Inject into the vein. Every 2 weeks    . dutasteride (AVODART) 0.5 MG capsule Take 1 capsule (0.5 mg total) by mouth daily. 90 capsule 3  . fluticasone (FLONASE) 50 MCG/ACT nasal spray Place 2 sprays into both nostrils daily. 16 g 6  . Fluticasone-Salmeterol (ADVAIR DISKUS IN) Inhale 1 puff into the lungs 2 (two) times daily.    Marland Kitchen gabapentin (NEURONTIN) 300 MG capsule Take 1 capsule in Am and 2 at night 270 capsule 3  . INCRUSE ELLIPTA 62.5 MCG/INH AEPB TAKE 1 PUFF BY MOUTH EVERY DAY 90 each 0  . linagliptin (TRADJENTA) 5 MG TABS tablet Take 1 tablet (5 mg total) by mouth daily. 90 tablet 3  . lisinopril-hydrochlorothiazide (PRINZIDE,ZESTORETIC) 20-25 MG tablet Take 1 tablet by mouth daily. 90 tablet 3   No current facility-administered medications for this visit.  Past Medical History:  Diagnosis Date  . Adenocarcinoma of lung, right ([place]) 04/18/2016  . Anxiety   . Arthritis   . CHF (congestive heart failure) ([place])   . COPD (chronic obstructive pulmonary disease) ([place])   . Depression   . Diabetes mellitus without complication ([facility])    no meds  . DM (diabetes mellitus) ([place]) 07/09/2016  . Dyspnea   . History of kidney stones   . Hyperlipidemia   . Hypertension   . Macular degeneration   . Neuropathy   . Non-small cell lung cancer, right ([place]) 04/18/2016  . Prostatitis   . Pulmonary nodule, left 07/16/2016  . Sleep apnea    cpap    Past Surgical History:  Procedure Laterality Date  .  CATARACT EXTRACTION, BILATERAL Bilateral   . NO PAST SURGERIES    . PORTACATH PLACEMENT Left 06/13/2016   Procedure: INSERTION PORT-A-CATH;  Surgeon: Aviva Signs, MD;  Location: [facility];  Service: General;  Laterality: Left;  Marland Kitchen VIDEO BRONCHOSCOPY WITH ENDOBRONCHIAL NAVIGATION N/A 05/28/2016   Procedure: VIDEO BRONCHOSCOPY WITH ENDOBRONCHIAL NAVIGATION;  Surgeon: [name], MD;  Location: [facility];  Service: Thoracic;  Laterality: N/A;  . VIDEO BRONCHOSCOPY WITH ENDOBRONCHIAL ULTRASOUND N/A 05/28/2016   Procedure: VIDEO BRONCHOSCOPY WITH ENDOBRONCHIAL ULTRASOUND;  Surgeon: [name], MD;  Location: [facility];  Service: Thoracic;  Laterality: N/A;    Social History   Socioeconomic History  . Marital status: Divorced    Spouse name: Not on file  . Number of children: 5  . Years of education: 10  . Highest education level: 10th grade  Occupational History  . Occupation: Retired  Scientific laboratory technician  . Financial resource strain: Not very hard  . Food insecurity:    Worry: Never true    Inability: Never true  . Transportation needs:    Medical: No    Non-medical: No  Tobacco Use  . Smoking status: Current Every Day Smoker    Packs/day: 0.10    Years: 55.00    Pack years: 5.50    Types: Cigarettes    Start date: 03/11/1961  . Smokeless tobacco: Never Used  . Tobacco comment: smoking 1 to 2 cigarettes per day  Substance and Sexual Activity  . Alcohol use: No  . Drug use: No  . Sexual activity: Yes    Birth control/protection: None  Lifestyle  . Physical activity:    Days per week: 0 days    Minutes per session: 0 min  . Stress: To some extent  Relationships  . Social connections:    Talks on phone: More than three times a week    Gets together: More than three times a week    Attends religious service: Never    Active member of club or organization: No    Attends meetings of clubs or organizations: Never    Relationship status: Divorced  . Intimate partner violence:      Fear of current or ex partner: Not on file    Emotionally abused: Not on file    Physically abused: Not on file    Forced sexual activity: Not on file  Other Topics Concern  . Not on file  Social History Narrative  . Not on file     Vitals:   10/14/17 1110  BP: (!) 148/78  Pulse: (!) 101  SpO2: 98%  Weight: 182 lb (82.6 kg)  Height: 5\' 6"  (1.676 m)    Wt Readings from Last 3 Encounters:  10/14/17 182  lb (82.6 kg)  09/25/17 182 lb 12.8 oz (82.9 kg)  09/10/17 182 lb 6.4 oz (82.7 kg)     PHYSICAL EXAM General: NAD HEENT: Normal. Neck: No JVD, no thyromegaly. Lungs: Poor air movement diffusely, bilateral expiratory wheezes, no crackles. CV: Regular rate and rhythm, normal S1/S2, no S3/S4, no murmur. No pretibial or periankle edema.     Abdomen: Soft, nontender, no distention.  Neurologic: Alert and oriented.  Psych: Normal affect. Skin: Normal. Musculoskeletal: No gross deformities.    ECG: Reviewed above under Subjective   Labs: Lab Results  Component Value Date/Time   K 3.8 09/25/2017 09:57 AM   BUN 19 09/25/2017 09:57 AM   BUN 15 08/28/2016 11:17 AM   CREATININE 0.91 09/25/2017 09:57 AM   ALT 15 (L) 09/25/2017 09:57 AM   TSH 4.644 (H) 09/11/2017 12:23 PM   HGB 13.2 09/25/2017 09:57 AM   HGB 9.6 (L) 08/28/2016 11:17 AM     Lipids: Lab Results  Component Value Date/Time   LDLCALC 97 09/10/2017 09:57 AM   CHOL 194 09/10/2017 09:57 AM   TRIG 362 (H) 09/10/2017 09:57 AM   HDL 25 (L) 09/10/2017 09:57 AM       ASSESSMENT AND PLAN: 1.  Chest pain: Lexiscan Myoview stress test was low risk with small area of inferior ischemia.  I will manage medically.  Continue aspirin and statin.  He did not tolerate metoprolol 25 mg twice daily.  Cardiovascular risk factors include hypertension, hyperlipidemia, and tobacco abuse.  I will have him try Imdur 30 mg daily.  If it does not help to improve his shortness of breath after a month, I told him to stop it.  He  currently denies chest pain.  2.  Hypertension: Blood pressure is elevated.  He is on clonidine 0.1 mg 3 times daily.  He also takes lisinopril 20 mg and hydrochlorothiazide 25 mg.  If it remains elevated, I would recommend increasing lisinopril to 40 mg.  3.  Hyperlipidemia: On pravastatin.  4.  Pericardial effusion: This was trivial in size in April 2019.  5.  Exertional dyspnea: Likely due to advanced COPD. I will have him try Imdur 30 mg daily.  If it does not help to improve his shortness of breath after a month, I told him to stop it.    6.  Sleep apnea: He uses CPAP.  I recommended he follow-up with his sleep medicine physician as he may require titration given his current levels of fatigue.   Disposition: Follow up 6 months   [name], M.D., F.A.C.C.

## 2017-10-14 NOTE — Addendum Note (Signed)
Addended by: Laurine Blazer on: 10/14/2017 11:39 AM   Modules accepted: Orders

## 2017-10-14 NOTE — Patient Instructions (Signed)
Medication Instructions:   Begin Imdur 30mg  daily.  Continue all other medications.    Labwork: none  Testing/Procedures: none  Follow-Up: Your physician wants you to follow up in: 6 months.  You will receive a reminder letter in the mail one-two months in advance.  If you don't receive a letter, please call our office to schedule the follow up appointment   Any Other Special Instructions Will Be Listed Below (If Applicable).  If you need a refill on your cardiac medications before your next appointment, please call your pharmacy.

## 2017-10-27 ENCOUNTER — Other Ambulatory Visit (HOSPITAL_COMMUNITY): Payer: Self-pay

## 2017-10-27 DIAGNOSIS — C3491 Malignant neoplasm of unspecified part of right bronchus or lung: Secondary | ICD-10-CM

## 2017-10-28 ENCOUNTER — Inpatient Hospital Stay (HOSPITAL_COMMUNITY): Payer: Medicare Other | Attending: Hematology

## 2017-10-28 ENCOUNTER — Ambulatory Visit (HOSPITAL_COMMUNITY): Payer: Medicare Other | Admitting: Hematology

## 2017-10-28 ENCOUNTER — Ambulatory Visit (HOSPITAL_COMMUNITY)
Admission: RE | Admit: 2017-10-28 | Discharge: 2017-10-28 | Disposition: A | Discharge status: Home / Self Care | Payer: Medicare Other | Source: Ambulatory Visit | Attending: Hematology | Admitting: Hematology

## 2017-10-28 DIAGNOSIS — R5383 Other fatigue: Secondary | ICD-10-CM | POA: Diagnosis not present

## 2017-10-28 DIAGNOSIS — C3411 Malignant neoplasm of upper lobe, right bronchus or lung: Secondary | ICD-10-CM | POA: Insufficient documentation

## 2017-10-28 DIAGNOSIS — E039 Hypothyroidism, unspecified: Secondary | ICD-10-CM | POA: Insufficient documentation

## 2017-10-28 DIAGNOSIS — C3491 Malignant neoplasm of unspecified part of right bronchus or lung: Secondary | ICD-10-CM | POA: Insufficient documentation

## 2017-10-28 DIAGNOSIS — C3490 Malignant neoplasm of unspecified part of unspecified bronchus or lung: Secondary | ICD-10-CM | POA: Diagnosis not present

## 2017-10-28 LAB — CBC WITH DIFFERENTIAL/PLATELET
BASOS ABS: 0 10*3/uL (ref 0.0–0.1)
Basophils Relative: 1 %
Eosinophils Absolute: 0.2 10*3/uL (ref 0.0–0.7)
Eosinophils Relative: 3 %
HEMATOCRIT: 43.8 % (ref 39.0–52.0)
HEMOGLOBIN: 14.8 g/dL (ref 13.0–17.0)
LYMPHS ABS: 0.8 10*3/uL (ref 0.7–4.0)
LYMPHS PCT: 10 %
MCH: 33.4 pg (ref 26.0–34.0)
MCHC: 33.8 g/dL (ref 30.0–36.0)
MCV: 98.9 fL (ref 78.0–100.0)
Monocytes Absolute: 0.8 10*3/uL (ref 0.1–1.0)
Monocytes Relative: 11 %
NEUTROS ABS: 5.9 10*3/uL (ref 1.7–7.7)
NEUTROS PCT: 77 %
Platelets: 312 10*3/uL (ref 150–400)
RBC: 4.43 MIL/uL (ref 4.22–5.81)
RDW: 13.8 % (ref 11.5–15.5)
WBC: 7.7 10*3/uL (ref 4.0–10.5)

## 2017-10-28 LAB — COMPREHENSIVE METABOLIC PANEL
ALK PHOS: 91 U/L (ref 38–126)
ALT: 16 U/L (ref 0–44)
AST: 16 U/L (ref 15–41)
Albumin: 3.8 g/dL (ref 3.5–5.0)
Anion gap: 10 (ref 5–15)
BUN: 17 mg/dL (ref 8–23)
CALCIUM: 9.3 mg/dL (ref 8.9–10.3)
CHLORIDE: 98 mmol/L (ref 98–111)
CO2: 29 mmol/L (ref 22–32)
CREATININE: 0.9 mg/dL (ref 0.61–1.24)
Glucose, Bld: 208 mg/dL — ABNORMAL HIGH (ref 70–99)
Potassium: 4.1 mmol/L (ref 3.5–5.1)
Sodium: 137 mmol/L (ref 135–145)
Total Bilirubin: 0.3 mg/dL (ref 0.3–1.2)
Total Protein: 7.7 g/dL (ref 6.5–8.1)

## 2017-10-28 LAB — LACTATE DEHYDROGENASE: LDH: 141 U/L (ref 98–192)

## 2017-10-28 MED ORDER — IOHEXOL 300 MG/ML  SOLN
75.0000 mL | Freq: Once | INTRAMUSCULAR | Status: AC | PRN
  Administered 2017-10-28: 75 mL via INTRAVENOUS

## 2017-10-29 ENCOUNTER — Encounter (HOSPITAL_COMMUNITY): Payer: Self-pay | Admitting: Hematology

## 2017-10-29 ENCOUNTER — Other Ambulatory Visit: Payer: Self-pay

## 2017-10-29 ENCOUNTER — Inpatient Hospital Stay (HOSPITAL_BASED_OUTPATIENT_CLINIC_OR_DEPARTMENT_OTHER): Payer: Medicare Other | Admitting: Hematology

## 2017-10-29 DIAGNOSIS — C3411 Malignant neoplasm of upper lobe, right bronchus or lung: Secondary | ICD-10-CM

## 2017-10-29 DIAGNOSIS — E032 Hypothyroidism due to medicaments and other exogenous substances: Secondary | ICD-10-CM

## 2017-10-29 DIAGNOSIS — E039 Hypothyroidism, unspecified: Secondary | ICD-10-CM | POA: Diagnosis not present

## 2017-10-29 DIAGNOSIS — R5383 Other fatigue: Secondary | ICD-10-CM

## 2017-10-29 DIAGNOSIS — C3491 Malignant neoplasm of unspecified part of right bronchus or lung: Secondary | ICD-10-CM

## 2017-10-29 MED ORDER — LEVOTHYROXINE SODIUM 25 MCG PO TABS: 25.0000 ug | ORAL_TABLET | Freq: Every day | ORAL | 1 refills | Status: DC

## 2017-10-29 NOTE — Progress Notes (Signed)
[place], [place] [address]   CLINIC:  Medical Oncology/Hematology  PCP:  [name], MD [place] [phone]   REASON FOR VISIT: Follow-up for non small cell lung cancer IIIA  CURRENT THERAPY: surveillance   BRIEF ONCOLOGIC HISTORY:    Non-small cell lung cancer, right ([place])   04/17/2016 Imaging    CT chest- 1. Examination is positive for bilateral upper lobe pulmonary lesions suspicious for malignancy. Further investigation with PET-CT and tissue sampling is advised. 2. Borderline enlarged paratracheal lymph nodes and right hilar nodes. Cannot rule out metastatic adenopathy. Attention to these areas on PET-CT is advised. 3. Emphysema 4. Aortic atherosclerosis and multi vessel coronary artery calcification.      04/18/2016 Initial Diagnosis    Adenocarcinoma of lung, right ([place])      04/24/2016 PET scan    1. Adjacent hypermetabolic nodules in the RIGHT upper lobe consistent bronchogenic carcinoma. 2. Suspicion of ipsilateral hilar and paratracheal nodal metastasis. 3. Nodule in the LEFT upper lobe with suspicious morphology has a relatively low metabolic activity for size. Favor synchronous primary bronchogenic carcinoma. 4. No metabolic activity associated with a small LEFT lower lobe pulmonary nodule. Recommend attention on follow-up.      05/06/2016 Imaging    MRI brain- Negative for metastatic disease.      05/28/2016 Procedure    1. Electromagnetic navigational bronchoscopy with brushings, needle     aspirations and biopsies of bilateral upper lobe nodules. 2. Endobronchial ultrasound with mediastinal and hilar lymph node     aspirations. By Dr. [name]      06/03/2016 Pathology Results    Diagnosis 1. Lung, biopsy, Right upper lobe - BLOOD AND BENIGN BRONCHIAL AND LUNG TISSUE. 2. Lung, biopsy, Left upper lobe - BLOOD AND BENIGN BRONCHIAL AND LUNG TISSUE.      06/03/2016  Pathology Results    Diagnosis TRANSBRONCHIAL NEEDLE ASPIRATION, NAVIGATION, RUL (SPECIMEN 5 OF 7, COLLECTED 05/28/16): MALIGNANT CELLS CONSISTENT WITH NON-SMALL CELL CARCINOMA Comment There are limited tumor cells in the cell block (insufficient for molecular testing). TTF-1 is positive in a few of the atypical cells. They appear negative for synaptophysin and cytokeratin 5/6. Overall there is limited tumor, but a lung adenocarcinoma is slightly favored. Dr. [name] has reviewed the case. The case was called to Dr. [name] on 06/03/2016.      06/13/2016 Procedure    Port placed by Dr. Arnoldo Morale      06/25/2016 - 08/15/2016 Chemotherapy    The patient had palonosetron (ALOXI) injection 0.25 mg, 0.25 mg, Intravenous,  Once, 6 of 6 cycles  CARBOplatin (PARAPLATIN) 220 mg in sodium chloride 0.9 % 250 mL chemo infusion, 220 mg (100 % of original dose 215.8 mg), Intravenous,  Once, 6 of 6 cycles Dose modification:   (original dose 215.8 mg, Cycle 1),   (original dose 215.8 mg, Cycle 5),   (original dose 215.8 mg, Cycle 6),   (original dose 215.8 mg, Cycle 2),   (original dose 215.8 mg, Cycle 3),   (original dose 215.8 mg, Cycle 4)  PACLitaxel (TAXOL) 90 mg in dextrose 5 % 250 mL chemo infusion ( for chemotherapy treatment.        06/25/2016 - 08/20/2016 Radiation Therapy    Radiation in [place], [place] by Dr. Quitman Livings.  R lung to 6600 cGy      07/09/2016 - 07/10/2016 Hospital Admission    Admit date: 07/09/2016  Admission diagnosis: Fever and chills  Additional comments: Negative work-up, discharged with course of Levaquin      07/09/2016 Treatment Plan Change    Treatment deferred x 1 week due to hospitalization       08/05/2016 - 08/06/2016 Hospital Admission    Admit date: 08/05/2016 Admission diagnosis: Joint pain Additional comments: Treated with steroids with symptomatic improvement      08/25/2016 Imaging    MRI brain- Negative for intracranial metastasis on this motion  degraded examination.  Stable mild-to-moderate chronic small vessel ischemic disease.      09/18/2016 PET scan    1. Partial metabolic response. Right upper lobe 2.6 cm hypermetabolic pulmonary nodule, decreased in size and metabolism. Mildly hypermetabolic right hilar nodal metastasis, decreased in metabolism. Right paratracheal lymph node is no longer hypermetabolic. No new or progressive metastatic disease. 2. Apical left upper lobe 0.9 cm solid pulmonary nodule, slightly decreased in size, with no significant metabolism. The change in size could indicate a neoplastic etiology for this nodule. 3. Additional subcentimeter pulmonary nodules in the left lung are stable and below PET resolution . 4. Additional findings include aortic atherosclerosis, coronary atherosclerosis, moderate emphysema, stable small pericardial effusion/thickening and mild sigmoid diverticulosis.      01/13/2017 Imaging    CT chest with contrast IMPRESSION: 1. No change in size of the hypermetabolic nodular thickening in the RIGHT upper lobe. 2. LEFT upper lobe spiculated nodule is stable. 3. Rounded LEFT lower lobe pulmonary nodule is stable. 4. No evidence of lung cancer progression.      05/08/2017 Adverse Reaction    Development of Hypothyroidism- likely secondary to immunotherapy.  Levothyroxine therapy initiated.         CANCER STAGING: Cancer Staging Non-small cell lung cancer, right [facility]) Staging form: Lung, AJCC 8th Edition - Clinical stage from 06/05/2016: Stage IIIA (cT1b(2), cN2, cM0) - Signed by Baird Cancer, PA-C on 08/21/2016    INTERVAL HISTORY:  John [name] 72 y.o. male returns for routine follow-up for non small cell lung cancer. Patient states he is tired all the time. He wants to sleep all day. He feels he needs to go back Dr. Danelle Berry so he can change his CPAP settings. He is wanting to be referred to a dr around here for his machine. Patient occasionally has nausea and  constipation issues. Denies any new pains. Denies any SOB or chest pain. Denies any vomiting or diarrhea. Patient appetite is at about 50%.     REVIEW OF SYSTEMS:  Review of Systems  Constitutional: Positive for fatigue.  HENT:  Negative.   Eyes: Negative.   Respiratory: Negative.   Cardiovascular: Negative.   Gastrointestinal: Positive for constipation and nausea.  Endocrine: Negative.   Genitourinary: Negative.    Musculoskeletal: Negative.   Skin: Negative.   Neurological: Negative.   Hematological: Negative.   Psychiatric/Behavioral: Negative.      PAST MEDICAL/SURGICAL HISTORY:  Past Medical History:  Diagnosis Date  . Adenocarcinoma of lung, right ([place]) 04/18/2016  . Anxiety   . Arthritis   . CHF (congestive heart failure) ([place])   . COPD (chronic obstructive pulmonary disease) ([place])   . Depression   . Diabetes mellitus without complication ([facility])    no meds  . DM (diabetes mellitus) ([place]) 07/09/2016  . Dyspnea   . History of kidney stones   . Hyperlipidemia   . Hypertension   . Macular degeneration   . Neuropathy   . Non-small cell lung cancer, right ([place]) 04/18/2016  . Prostatitis   . Pulmonary nodule,  left 07/16/2016  . Sleep apnea    cpap   Past Surgical History:  Procedure Laterality Date  . CATARACT EXTRACTION, BILATERAL Bilateral   . NO PAST SURGERIES    . PORTACATH PLACEMENT Left 06/13/2016   Procedure: INSERTION PORT-A-CATH;  Surgeon: Aviva Signs, MD;  Location: [facility];  Service: General;  Laterality: Left;  Marland Kitchen VIDEO BRONCHOSCOPY WITH ENDOBRONCHIAL NAVIGATION N/A 05/28/2016   Procedure: VIDEO BRONCHOSCOPY WITH ENDOBRONCHIAL NAVIGATION;  Surgeon: [name], MD;  Location: [facility];  Service: Thoracic;  Laterality: N/A;  . VIDEO BRONCHOSCOPY WITH ENDOBRONCHIAL ULTRASOUND N/A 05/28/2016   Procedure: VIDEO BRONCHOSCOPY WITH ENDOBRONCHIAL ULTRASOUND;  Surgeon: [name], MD;  Location: [facility];  Service: Thoracic;  Laterality: N/A;     SOCIAL  HISTORY:  Social History   Socioeconomic History  . Marital status: Divorced    Spouse name: Not on file  . Number of children: 5  . Years of education: 10  . Highest education level: 10th grade  Occupational History  . Occupation: Retired  Scientific laboratory technician  . Financial resource strain: Not very hard  . Food insecurity:    Worry: Never true    Inability: Never true  . Transportation needs:    Medical: No    Non-medical: No  Tobacco Use  . Smoking status: Current Every Day Smoker    Packs/day: 0.10    Years: 55.00    Pack years: 5.50    Types: Cigarettes    Start date: 03/11/1961  . Smokeless tobacco: Never Used  . Tobacco comment: smoking 1 to 2 cigarettes per day  Substance and Sexual Activity  . Alcohol use: No  . Drug use: No  . Sexual activity: Yes    Birth control/protection: None  Lifestyle  . Physical activity:    Days per week: 0 days    Minutes per session: 0 min  . Stress: To some extent  Relationships  . Social connections:    Talks on phone: More than three times a week    Gets together: More than three times a week    Attends religious service: Never    Active member of club or organization: No    Attends meetings of clubs or organizations: Never    Relationship status: Divorced  . Intimate partner violence:    Fear of current or ex partner: Not on file    Emotionally abused: Not on file    Physically abused: Not on file    Forced sexual activity: Not on file  Other Topics Concern  . Not on file  Social History Narrative  . Not on file    FAMILY HISTORY:  Family History  Problem Relation Age of Onset  . Hypertension Mother   . Diabetes Father   . Heart disease Father   . Stroke Father   . Hypertension Sister     CURRENT MEDICATIONS:  Outpatient Encounter Medications as of 10/29/2017  Medication Sig Note  . albuterol (PROVENTIL HFA;VENTOLIN HFA) 108 (90 Base) MCG/ACT inhaler Inhale 2 puffs into the lungs every 6 (six) hours as needed for  wheezing or shortness of breath.   Marland Kitchen aspirin EC 81 MG tablet Take 81 mg by mouth daily.   . cloNIDine (CATAPRES) 0.1 MG tablet Take 1 tablet (0.1 mg total) by mouth 3 (three) times daily.   Marland Kitchen dutasteride (AVODART) 0.5 MG capsule Take 1 capsule (0.5 mg total) by mouth daily.   . fluticasone (FLONASE) 50 MCG/ACT nasal spray Place 2 sprays into  both nostrils daily. 12/05/2016: As needed  . Fluticasone-Salmeterol (ADVAIR DISKUS IN) Inhale 1 puff into the lungs 2 (two) times daily. 12/05/2016: As needed  . gabapentin (NEURONTIN) 300 MG capsule Take 1 capsule in Am and 2 at night   . INCRUSE ELLIPTA 62.5 MCG/INH AEPB TAKE 1 PUFF BY MOUTH EVERY DAY   . levothyroxine (SYNTHROID, LEVOTHROID) 25 MCG tablet Take 1 tablet (25 mcg total) by mouth daily before breakfast.   . linagliptin (TRADJENTA) 5 MG TABS tablet Take 1 tablet (5 mg total) by mouth daily.   Marland Kitchen lisinopril-hydrochlorothiazide (PRINZIDE,ZESTORETIC) 20-25 MG tablet Take 1 tablet by mouth daily.   Marland Kitchen LORazepam (ATIVAN) 0.5 MG tablet TAKE 1 TABLET BY MOUTH TWICE DAILY AS NEEDED   . magic mouthwash w/lidocaine SOLN USE 1 TEASPOONFUL BY 15MINS BEFORE MEALS AND AS NEEDED FOR [place] AND SWALLOW 12/05/2016: As needed  . meloxicam (MOBIC) 7.5 MG tablet Take 1 tablet (7.5 mg total) by mouth daily as needed for pain (back pain).   . ondansetron (ZOFRAN) 8 MG tablet Take 1 tablet (8 mg total) by mouth every 8 (eight) hours as needed for nausea or vomiting.   . pravastatin (PRAVACHOL) 40 MG tablet Take 40 mg by mouth every 3 (three) days. M-W-F   . prochlorperazine (COMPAZINE) 10 MG tablet TAKE 1 TABLET (10 MG TOTAL) BY MOUTH EVERY 6 (SIX) HOURS AS NEEDED FOR NAUSEA OR VOMITING.   . tamsulosin (FLOMAX) 0.4 MG CAPS capsule TAKE 2 CAPSULES (0.8 MG TOTAL) BY MOUTH DAILY.   . traZODone (DESYREL) 50 MG tablet Take 1 tablet (50 mg total) by mouth at bedtime as needed for sleep.   . [DISCONTINUED] levothyroxine (SYNTHROID, LEVOTHROID) 25 MCG tablet TAKE 1  TABLET (25 MCG TOTAL) BY MOUTH DAILY BEFORE BREAKFAST.   Marland Kitchen Durvalumab (IMFINZI IV) Inject into the vein. Every 2 weeks   . isosorbide mononitrate (IMDUR) 30 MG 24 hr tablet Take 1 tablet (30 mg total) by mouth daily. (Patient not taking: Reported on 10/29/2017)    No facility-administered encounter medications on file as of 10/29/2017.     ALLERGIES:  No Known Allergies   PHYSICAL EXAM:  ECOG Performance status: 1  Vitals:   10/29/17 1432  BP: 124/64  Pulse: (!) 112  Resp: 18  Temp: 98.3 F (36.8 C)  SpO2: 97%   Filed Weights   10/29/17 1432  Weight: 181 lb 3.2 oz (82.2 kg)    Physical Exam  Constitutional: He is oriented to person, place, and time.  Cardiovascular: Normal rate, regular rhythm and normal heart sounds.  Pulmonary/Chest: Effort normal.  Wheezing upper lobes  Neurological: He is alert and oriented to person, place, and time.  Skin: Skin is warm and dry.     LABORATORY DATA:  I have reviewed the labs as listed.  CBC    Component Value Date/Time   WBC 7.7 10/28/2017 1008   RBC 4.43 10/28/2017 1008   HGB 14.8 10/28/2017 1008   HGB 9.6 (L) 08/28/2016 1117   HCT 43.8 10/28/2017 1008   HCT 27.7 (L) 08/28/2016 1117   PLT 312 10/28/2017 1008   PLT 268 08/28/2016 1117   MCV 98.9 10/28/2017 1008   MCV 96 08/28/2016 1117   MCH 33.4 10/28/2017 1008   MCHC 33.8 10/28/2017 1008   RDW 13.8 10/28/2017 1008   RDW 19.4 (H) 08/28/2016 1117   LYMPHSABS 0.8 10/28/2017 1008   LYMPHSABS 0.6 (L) 08/28/2016 1117   MONOABS 0.8 10/28/2017 1008   EOSABS 0.2 10/28/2017 1008  EOSABS 0.0 08/28/2016 1117   BASOSABS 0.0 10/28/2017 1008   BASOSABS 0.0 08/28/2016 1117   CMP Latest Ref Rng & Units 10/28/2017 09/25/2017 09/11/2017  Glucose 70 - 99 mg/dL 208(H) 187(H) 144(H)  BUN 8 - 23 mg/dL 17 19 17   Creatinine 0.61 - 1.24 mg/dL 0.90 0.91 0.83  Sodium 135 - 145 mmol/L 137 137 138  Potassium 3.5 - 5.1 mmol/L 4.1 3.8 3.8  Chloride 98 - 111 mmol/L 98 101 100(L)  CO2 22 - 32  mmol/L 29 27 29   Calcium 8.9 - 10.3 mg/dL 9.3 9.0 9.3  Total Protein 6.5 - 8.1 g/dL 7.7 7.3 7.4  Total Bilirubin 0.3 - 1.2 mg/dL 0.3 0.2(L) 0.6  Alkaline Phos 38 - 126 U/L 91 75 74  AST 15 - 41 U/L 16 17 18   ALT 0 - 44 U/L 16 15(L) 15(L)       DIAGNOSTIC IMAGING:  I have independently reviewed the images of his CT scan and discussed with the patient.     ASSESSMENT & PLAN:   Non-small cell lung cancer, right (HCC) 1.Stage IIIA (cT1BcN2cM0) NSCLC, favoring adenocarcinoma: Scan at diagnosis showed 2 right upper lobe pulmonary nodules measuring 1.7 and 2.0 cm, ipsilateral mediastinal adenopathy, status post bronchoscopy on 05/28/2016, left upper lobe pulmonary nodule not biopsy proven but suspicious with imaging.  He finished chemoradiation therapy with weekly carboplatin and paclitaxel on 08/20/2016.  Consolidation Durvalumab was started on 09/26/2016. -CT scan on 07/28/2017 shows stable disease.  He has completed 24 cycles of durvalumab on 09/25/2017. -CT scan of the chest with contrast dated 10/28/2017 showed stable post therapy change in the right upper lobe with an 8 mm nodule unchanged from April.  Left upper lobe showed 9 mm x 7 mm nodule which is also unchanged.  No evidence of disease progression.  We discussed this with the patient. -He will do follow-up visits with chest CT once every 3 to 6 months for 3 years, followed by H&P and chest CT every 6 months for 2 years.  2.  Fatigue: He was evaluated by cardiology and Lexiscan was negative.  He was started on Imdur.  He was told to follow-up with his sleep physician to titrate CPAP.  3.  Hypothyroidism: He is currently taking Synthroid 25 mcg daily.  Last TSH was normal.  We have sent a refill.        Orders placed this encounter:  Orders Placed This Encounter  Procedures  . CT Abdomen Pelvis W Contrast  . CT Chest W Contrast  . Magnesium  . CBC with Differential/Platelet  . Comprehensive metabolic panel  . TSH  . Lactate  dehydrogenase      Derek Jack, MD [place] [phone]

## 2017-10-29 NOTE — Patient Instructions (Addendum)
[place] at [facility] Discharge Instructions  Please follow up in the first week of Dec with labs and CT scans prior to visit. Follow up with your primary care MD to adjuet setting on CPAP machine.   Today you saw Dr Raliegh Ip.   Thank you for choosing [place] at [facility] to provide your oncology and hematology care.  To afford each patient quality time with our provider, please arrive at least 15 minutes before your scheduled appointment time.   If you have a lab appointment with the [facility] please come in thru the  Main Entrance and check in at the main information desk  You need to re-schedule your appointment should you arrive 10 or more minutes late.  We strive to give you quality time with our providers, and arriving late affects you and other patients whose appointments are after yours.  Also, if you no show three or more times for appointments you may be dismissed from the clinic at the providers discretion.     Again, thank you for choosing [facility].  Our hope is that these requests will decrease the amount of time that you wait before being seen by our physicians.       _____________________________________________________________  Should you have questions after your visit to [facility], please contact our office at [phone] between the hours of 8:30 a.m. and 4:30 p.m.  Voicemails left after 4:30 p.m. will not be returned until the following business day.  For prescription refill requests, have your pharmacy contact our office.       Resources For Cancer Patients and their Caregivers ? American Cancer Society: Can assist with transportation, wigs, general needs, runs Look Good Feel Better.        765-265-5123 ? Cancer Care: Provides financial assistance, online support groups, medication/co-pay assistance.  1-800-813-HOPE 830 282 0705) ? [facility] Assists [place] cancer patients and their families through emotional , educational and financial support.  6304126217 ? [place] DSS Where to apply for food stamps, Medicaid and utility assistance. 936-780-0054 ? [facility]: Transportation to medical appointments. (302)196-3805 ? Social Security Administration: May apply for disability if have a Stage IV cancer. 219-634-0588 9172399852 ? [employer], Disability and Transit Services: Assists with nutrition, care and transit needs. [place] Support Programs:   > Cancer Support Group  2nd Tuesday of the month 1pm-2pm, Journey Room   > Creative Journey  3rd Tuesday of the month 1130am-1pm, Journey Room

## 2017-10-29 NOTE — Assessment & Plan Note (Addendum)
1.Stage IIIA (cT1BcN2cM0) NSCLC, favoring adenocarcinoma: Scan at diagnosis showed 2 right upper lobe pulmonary nodules measuring 1.7 and 2.0 cm, ipsilateral mediastinal adenopathy, status post bronchoscopy on 05/28/2016, left upper lobe pulmonary nodule not biopsy proven but suspicious with imaging.  He finished chemoradiation therapy with weekly carboplatin and paclitaxel on 08/20/2016.  Consolidation Durvalumab was started on 09/26/2016. -CT scan on 07/28/2017 shows stable disease.  He has completed 24 cycles of durvalumab on 09/25/2017. -CT scan of the chest with contrast dated 10/28/2017 showed stable post therapy change in the right upper lobe with an 8 mm nodule unchanged from April.  Left upper lobe showed 9 mm x 7 mm nodule which is also unchanged.  No evidence of disease progression.  We discussed this with the patient. -He will do follow-up visits with chest CT once every 3 to 6 months for 3 years, followed by H&P and chest CT every 6 months for 2 years.  2.  Fatigue: He was evaluated by cardiology and Lexiscan was negative.  He was started on Imdur.  He was told to follow-up with his sleep physician to titrate CPAP.  3.  Hypothyroidism: He is currently taking Synthroid 25 mcg daily.  Last TSH was normal.  We have sent a refill.

## 2017-11-13 ENCOUNTER — Telehealth: Payer: Self-pay | Admitting: Cardiovascular Disease

## 2017-11-13 NOTE — Telephone Encounter (Signed)
Patient walk in  patient cannot take the following medication makes him depressed and want to sleep all the time. Patient stopped taking medication about a week ago.  isosorbide mononitrate (IMDUR) 30 MG 24 hr tablet    If patient is not home he said to leave a message

## 2017-11-16 NOTE — Telephone Encounter (Signed)
Noted  

## 2017-11-16 NOTE — Telephone Encounter (Signed)
Left message for patient

## 2017-11-22 ENCOUNTER — Other Ambulatory Visit (HOSPITAL_COMMUNITY): Payer: Self-pay | Admitting: Nurse Practitioner

## 2017-11-22 DIAGNOSIS — E032 Hypothyroidism due to medicaments and other exogenous substances: Secondary | ICD-10-CM

## 2017-11-22 DIAGNOSIS — C3491 Malignant neoplasm of unspecified part of right bronchus or lung: Secondary | ICD-10-CM

## 2017-12-10 ENCOUNTER — Encounter: Payer: Self-pay | Admitting: Family

## 2017-12-10 ENCOUNTER — Ambulatory Visit (INDEPENDENT_AMBULATORY_CARE_PROVIDER_SITE_OTHER): Payer: Medicare Other | Admitting: Family

## 2017-12-10 VITALS — BP 132/76 | HR 113 | Temp 97.2°F | Ht 66.0 in | Wt 182.2 lb

## 2017-12-10 DIAGNOSIS — E039 Hypothyroidism, unspecified: Secondary | ICD-10-CM

## 2017-12-10 DIAGNOSIS — R110 Nausea: Secondary | ICD-10-CM

## 2017-12-10 DIAGNOSIS — C3491 Malignant neoplasm of unspecified part of right bronchus or lung: Secondary | ICD-10-CM

## 2017-12-10 DIAGNOSIS — R5383 Other fatigue: Secondary | ICD-10-CM | POA: Diagnosis not present

## 2017-12-10 DIAGNOSIS — G4733 Obstructive sleep apnea (adult) (pediatric): Secondary | ICD-10-CM | POA: Diagnosis not present

## 2017-12-10 DIAGNOSIS — R0602 Shortness of breath: Secondary | ICD-10-CM | POA: Diagnosis not present

## 2017-12-10 MED ORDER — ALBUTEROL SULFATE HFA 108 (90 BASE) MCG/ACT IN AERS
2.0000 | INHALATION_SPRAY | Freq: Four times a day (QID) | RESPIRATORY_TRACT | 2 refills | Status: DC | PRN
Start: 2017-12-10 — End: 2017-12-11

## 2017-12-10 MED ORDER — ONDANSETRON HCL 8 MG PO TABS: 8.0000 mg | ORAL_TABLET | Freq: Three times a day (TID) | ORAL | 1 refills | Status: DC | PRN

## 2017-12-10 MED ORDER — PROCHLORPERAZINE MALEATE 10 MG PO TABS: 10.0000 mg | ORAL_TABLET | Freq: Four times a day (QID) | ORAL | 2 refills | Status: DC | PRN

## 2017-12-10 MED ORDER — ALBUTEROL SULFATE HFA 108 (90 BASE) MCG/ACT IN AERS
2.0000 | INHALATION_SPRAY | Freq: Four times a day (QID) | RESPIRATORY_TRACT | 2 refills | Status: DC | PRN
Start: 2017-12-10 — End: 2017-12-10

## 2017-12-10 MED ORDER — LEVOTHYROXINE SODIUM 50 MCG PO TABS: 50.0000 ug | ORAL_TABLET | Freq: Every day | ORAL | 3 refills | Status: DC

## 2017-12-10 NOTE — Progress Notes (Signed)
Subjective:    Patient ID: John [name], male    DOB: 14-Oct-1945, 72 y.o.   MRN: [id number]  Chief Complaint  Patient presents with  . Fatigue  -constant and ongoing for 2 years -nothing makes it better -June 21 last chemo, for Non-Small Cell lung -cpap machine ill-fitting and 72 years old  . Flank Pain -Worse in the mornings -no dysuria  . Nausea -constant everyday -appetite is OK; still eating and drinking -weight stable -no vomiting  . Shortness of Breath -with walking short distances -out of 2 inhalers-does get some relief with these -still smokes     Flank Pain  This is a new problem. The current episode started in the past 7 days. The problem occurs intermittently. The problem has been gradually worsening since onset. The quality of the pain is described as aching. The pain does not radiate. The pain is at a severity of 7/10. The pain is moderate. The pain is worse during the day. Stiffness is present in the morning. Associated symptoms include a fever, numbness (feet and hands bilaterally (has neuropathy)) and tingling. Pertinent negatives include no chest pain or dysuria. Risk factors include obesity and history of cancer. He has tried analgesics for the symptoms. The treatment provided mild relief.  Shortness of Breath  Associated symptoms include a fever and wheezing. Pertinent negatives include no chest pain.  Thyroid Problem  Presents for follow-up visit. Symptoms include constipation, fatigue (constant) and hoarse voice. Patient reports no diaphoresis or dry skin. The symptoms have been stable.      Review of Systems  Constitutional: Positive for activity change, chills, fatigue (constant) and fever. Negative for appetite change, diaphoresis and unexpected weight change.  HENT: Positive for hoarse voice.   Eyes: Negative.   Respiratory: Positive for cough (productive), shortness of breath and wheezing.   Cardiovascular: Negative for chest pain.    Gastrointestinal: Positive for constipation.  Endocrine: Negative.   Genitourinary: Positive for flank pain and frequency. Negative for dysuria.  Musculoskeletal: Positive for arthralgias (with walking).  Skin: Negative.   Allergic/Immunologic: Negative.   Neurological: Positive for tingling and numbness (feet and hands bilaterally (has neuropathy)).  Hematological: Negative.   Psychiatric/Behavioral: Positive for sleep disturbance.       Objective:   Physical Exam  Constitutional: He is oriented to person, place, and time. He appears well-developed and well-nourished. He is sleeping.  HENT:  Head: Normocephalic and atraumatic.  Eyes: Pupils are equal, round, and reactive to light.  Neck: Normal range of motion.  Cardiovascular: Regular rhythm. Tachycardia present. Exam reveals distant heart sounds.  Pulses:      Radial pulses are 2+ on the right side, and 2+ on the left side.  Pulmonary/Chest: Effort normal and breath sounds normal.  Abdominal: Bowel sounds are normal. He exhibits distension.  Musculoskeletal:       Arms: Tenderness bilateral flanks   Neurological: He is alert and oriented to person, place, and time.  Skin: Skin is warm and dry.  Psychiatric: He has a normal mood and affect.    BP 132/76   Pulse (!) 113   Temp (!) 97.2 F (36.2 C) (Oral)   Ht 5\' 6"  (1.676 m)   Wt 182 lb 3.2 oz (82.6 kg)   SpO2 96%   BMI 29.41 kg/m      Assessment & Plan:  John [name] comes in today with chief complaint of Fatigue; Flank Pain; Nausea; and Shortness of Breath   Diagnosis and orders addressed:  1.  OSA (obstructive sleep apnea) Will do referral for Pulmonologist for new sleep study and new CPAP  - Ambulatory referral to Pulmonology  2. Fatigue, unspecified type - TSH  3. Hypothyroidism, unspecified type Levothyroxine increased today to 50 mcg from 25 mcg - TSH - levothyroxine (SYNTHROID, LEVOTHROID) 50 MCG tablet; Take 1 tablet (50 mcg total) by mouth  daily.  Dispense: 90 tablet; Refill: 3  4. Non-small cell lung cancer, right ([place]) Keep appt with Oncologists - prochlorperazine (COMPAZINE) 10 MG tablet; Take 1 tablet (10 mg total) by mouth every 6 (six) hours as needed for nausea or vomiting.  Dispense: 60 tablet; Refill: 2 - albuterol (PROVENTIL HFA;VENTOLIN HFA) 108 (90 Base) MCG/ACT inhaler; Inhale 2 puffs into the lungs every 6 (six) hours as needed for wheezing or shortness of breath.  Dispense: 1 Inhaler; Refill: 2  5. Nausea without vomiting - ondansetron (ZOFRAN) 8 MG tablet; Take 1 tablet (8 mg total) by mouth every 8 (eight) hours as needed for nausea or vomiting.  Dispense: 60 tablet; Refill: 1 - prochlorperazine (COMPAZINE) 10 MG tablet; Take 1 tablet (10 mg total) by mouth every 6 (six) hours as needed for nausea or vomiting.  Dispense: 60 tablet; Refill: 2  6. SOB (shortness of breath) on exertion - albuterol (PROVENTIL HFA;VENTOLIN HFA) 108 (90 Base) MCG/ACT inhaler; Inhale 2 puffs into the lungs every 6 (six) hours as needed for wheezing or shortness of breath.  Dispense: 1 Inhaler; Refill: 2   Labs pending Health Maintenance reviewed Diet and exercise encouraged  Follow up plan: Keep follow up with Oncologists and PCP   Evelina Dun, FNP

## 2017-12-10 NOTE — Patient Instructions (Signed)
Sleep Apnea Sleep apnea is a condition in which breathing pauses or becomes shallow during sleep. Episodes of sleep apnea usually last 10 seconds or longer, and they may occur as many as 20 times an hour. Sleep apnea disrupts your sleep and keeps your body from getting the rest that it needs. This condition can increase your risk of certain health problems, including:  Heart attack.  Stroke.  Obesity.  Diabetes.  Heart failure.  Irregular heartbeat.  There are three kinds of sleep apnea:  Obstructive sleep apnea. This kind is caused by a blocked or collapsed airway.  Central sleep apnea. This kind happens when the part of the brain that controls breathing does not send the correct signals to the muscles that control breathing.  Mixed sleep apnea. This is a combination of obstructive and central sleep apnea.  What are the causes? The most common cause of this condition is a collapsed or blocked airway. An airway can collapse or become blocked if:  Your throat muscles are abnormally relaxed.  Your tongue and tonsils are larger than normal.  You are overweight.  Your airway is smaller than normal.  What increases the risk? This condition is more likely to develop in people who:  Are overweight.  Smoke.  Have a smaller than normal airway.  Are elderly.  Are male.  Drink alcohol.  Take sedatives or tranquilizers.  Have a family history of sleep apnea.  What are the signs or symptoms? Symptoms of this condition include:  Trouble staying asleep.  Daytime sleepiness and tiredness.  Irritability.  Loud snoring.  Morning headaches.  Trouble concentrating.  Forgetfulness.  Decreased interest in sex.  Unexplained sleepiness.  Mood swings.  Personality changes.  Feelings of depression.  Waking up often during the night to urinate.  Dry mouth.  Sore throat.  How is this diagnosed? This condition may be diagnosed with:  A medical history.  A  physical exam.  A series of tests that are done while you are sleeping (sleep study). These tests are usually done in a sleep lab, but they may also be done at home.  How is this treated? Treatment for this condition aims to restore normal breathing and to ease symptoms during sleep. It may involve managing health issues that can affect breathing, such as high blood pressure or obesity. Treatment may include:  Sleeping on your side.  Using a decongestant if you have nasal congestion.  Avoiding the use of depressants, including alcohol, sedatives, and narcotics.  Losing weight if you are overweight.  Making changes to your diet.  Quitting smoking.  Using a device to open your airway while you sleep, such as: ? An oral appliance. This is a custom-made mouthpiece that shifts your lower jaw forward. ? A continuous positive airway pressure (CPAP) device. This device delivers oxygen to your airway through a mask. ? A nasal expiratory positive airway pressure (EPAP) device. This device has valves that you put into each nostril. ? A bi-level positive airway pressure (BPAP) device. This device delivers oxygen to your airway through a mask.  Surgery if other treatments do not work. During surgery, excess tissue is removed to create a wider airway.  It is important to get treatment for sleep apnea. Without treatment, this condition can lead to:  High blood pressure.  Coronary artery disease.  (Men) An inability to achieve or maintain an erection (impotence).  Reduced thinking abilities.  Follow these instructions at home:  Make any lifestyle changes that your health   care provider recommends.  Eat a healthy, well-balanced diet.  Take over-the-counter and prescription medicines only as told by your health care provider.  Avoid using depressants, including alcohol, sedatives, and narcotics.  Take steps to lose weight if you are overweight.  If you were given a device to open your  airway while you sleep, use it only as told by your health care provider.  Do not use any tobacco products, such as cigarettes, chewing tobacco, and e-cigarettes. If you need help quitting, ask your health care provider.  Keep all follow-up visits as told by your health care provider. This is important. Contact a health care provider if:  The device that you received to open your airway during sleep is uncomfortable or does not seem to be working.  Your symptoms do not improve.  Your symptoms get worse. Get help right away if:  You develop chest pain.  You develop shortness of breath.  You develop discomfort in your back, arms, or stomach.  You have trouble speaking.  You have weakness on one side of your body.  You have drooping in your face. These symptoms may represent a serious problem that is an emergency. Do not wait to see if the symptoms will go away. Get medical help right away. Call your local emergency services (911 in the [place] Do not drive yourself to the hospital. This information is not intended to replace advice given to you by your health care provider. Make sure you discuss any questions you have with your health care provider. Document Released: 03/14/2002 Document Revised: 11/18/2015 Document Reviewed: 01/01/2015 Elsevier Interactive Patient Education  2018 [employer]  

## 2017-12-11 ENCOUNTER — Other Ambulatory Visit: Payer: Self-pay | Admitting: Family

## 2017-12-11 DIAGNOSIS — R0602 Shortness of breath: Secondary | ICD-10-CM

## 2017-12-11 DIAGNOSIS — C3491 Malignant neoplasm of unspecified part of right bronchus or lung: Secondary | ICD-10-CM

## 2017-12-12 LAB — CBC WITH DIFFERENTIAL/PLATELET
BASOS ABS: 0 10*3/uL (ref 0.0–0.2)
Basos: 0 %
EOS (ABSOLUTE): 0.2 10*3/uL (ref 0.0–0.4)
Eos: 2 %
Hematocrit: 35.6 % — ABNORMAL LOW (ref 37.5–51.0)
Hemoglobin: 12.2 g/dL — ABNORMAL LOW (ref 13.0–17.7)
Immature Grans (Abs): 0 10*3/uL (ref 0.0–0.1)
Immature Granulocytes: 0 %
LYMPHS: 9 %
Lymphocytes Absolute: 0.7 10*3/uL (ref 0.7–3.1)
MCH: 32.4 pg (ref 26.6–33.0)
MCHC: 34.3 g/dL (ref 31.5–35.7)
MCV: 95 fL (ref 79–97)
Monocytes Absolute: 0.9 10*3/uL (ref 0.1–0.9)
Monocytes: 10 %
NEUTROS ABS: 6.9 10*3/uL (ref 1.4–7.0)
Neutrophils: 79 %
Platelets: 530 10*3/uL — ABNORMAL HIGH (ref 150–450)
RBC: 3.76 x10E6/uL — ABNORMAL LOW (ref 4.14–5.80)
RDW: 14.3 % (ref 12.3–15.4)
WBC: 8.7 10*3/uL (ref 3.4–10.8)

## 2017-12-12 LAB — BMP8+EGFR
BUN / CREAT RATIO: 13 (ref 10–24)
BUN: 12 mg/dL (ref 8–27)
CO2: 25 mmol/L (ref 20–29)
Calcium: 9 mg/dL (ref 8.6–10.2)
Chloride: 95 mmol/L — ABNORMAL LOW (ref 96–106)
Creatinine, Ser: 0.91 mg/dL (ref 0.76–1.27)
GFR calc Af Amer: 98 mL/min/{1.73_m2} (ref >59)
GFR calc non Af Amer: 84 mL/min/{1.73_m2} (ref >59)
GLUCOSE: 191 mg/dL — AB (ref 65–99)
POTASSIUM: 4.2 mmol/L (ref 3.5–5.2)
Sodium: 138 mmol/L (ref 134–144)

## 2017-12-12 LAB — TSH: TSH: 2.22 u[IU]/mL (ref 0.450–4.500)

## 2017-12-14 ENCOUNTER — Telehealth: Payer: Self-pay | Admitting: Family

## 2017-12-15 NOTE — Telephone Encounter (Signed)
Patient aware of resuls

## 2017-12-15 NOTE — Telephone Encounter (Signed)
Left message to call back  

## 2017-12-15 NOTE — Telephone Encounter (Signed)
Left message to call.

## 2017-12-29 ENCOUNTER — Other Ambulatory Visit: Payer: Self-pay | Admitting: Nurse Practitioner

## 2017-12-29 ENCOUNTER — Ambulatory Visit (INDEPENDENT_AMBULATORY_CARE_PROVIDER_SITE_OTHER): Payer: Medicare Other | Admitting: Family

## 2017-12-29 ENCOUNTER — Encounter: Payer: Self-pay | Admitting: Family

## 2017-12-29 VITALS — BP 126/79 | HR 97 | Temp 97.0°F | Ht 66.0 in | Wt 188.6 lb

## 2017-12-29 DIAGNOSIS — C3491 Malignant neoplasm of unspecified part of right bronchus or lung: Secondary | ICD-10-CM | POA: Diagnosis not present

## 2017-12-29 DIAGNOSIS — G4733 Obstructive sleep apnea (adult) (pediatric): Secondary | ICD-10-CM | POA: Diagnosis not present

## 2017-12-29 DIAGNOSIS — E1142 Type 2 diabetes mellitus with diabetic polyneuropathy: Secondary | ICD-10-CM | POA: Diagnosis not present

## 2017-12-29 DIAGNOSIS — N401 Enlarged prostate with lower urinary tract symptoms: Secondary | ICD-10-CM | POA: Diagnosis not present

## 2017-12-29 DIAGNOSIS — I10 Essential (primary) hypertension: Secondary | ICD-10-CM | POA: Diagnosis not present

## 2017-12-29 DIAGNOSIS — E785 Hyperlipidemia, unspecified: Secondary | ICD-10-CM

## 2017-12-29 DIAGNOSIS — E039 Hypothyroidism, unspecified: Secondary | ICD-10-CM

## 2017-12-29 DIAGNOSIS — F17200 Nicotine dependence, unspecified, uncomplicated: Secondary | ICD-10-CM

## 2017-12-29 DIAGNOSIS — Z1211 Encounter for screening for malignant neoplasm of colon: Secondary | ICD-10-CM | POA: Diagnosis not present

## 2017-12-29 DIAGNOSIS — R3912 Poor urinary stream: Secondary | ICD-10-CM

## 2017-12-29 DIAGNOSIS — F5101 Primary insomnia: Secondary | ICD-10-CM

## 2017-12-29 DIAGNOSIS — E1169 Type 2 diabetes mellitus with other specified complication: Secondary | ICD-10-CM | POA: Diagnosis not present

## 2017-12-29 DIAGNOSIS — E871 Hypo-osmolality and hyponatremia: Secondary | ICD-10-CM | POA: Diagnosis not present

## 2017-12-29 DIAGNOSIS — M150 Primary generalized (osteo)arthritis: Secondary | ICD-10-CM

## 2017-12-29 DIAGNOSIS — Z1212 Encounter for screening for malignant neoplasm of rectum: Secondary | ICD-10-CM

## 2017-12-29 DIAGNOSIS — M159 Polyosteoarthritis, unspecified: Secondary | ICD-10-CM

## 2017-12-29 DIAGNOSIS — F17210 Nicotine dependence, cigarettes, uncomplicated: Secondary | ICD-10-CM | POA: Insufficient documentation

## 2017-12-29 DIAGNOSIS — R609 Edema, unspecified: Secondary | ICD-10-CM

## 2017-12-29 DIAGNOSIS — J431 Panlobular emphysema: Secondary | ICD-10-CM | POA: Diagnosis not present

## 2017-12-29 DIAGNOSIS — I509 Heart failure, unspecified: Secondary | ICD-10-CM | POA: Diagnosis not present

## 2017-12-29 DIAGNOSIS — Z1159 Encounter for screening for other viral diseases: Secondary | ICD-10-CM

## 2017-12-29 DIAGNOSIS — E1140 Type 2 diabetes mellitus with diabetic neuropathy, unspecified: Secondary | ICD-10-CM | POA: Insufficient documentation

## 2017-12-29 LAB — BAYER DCA HB A1C WAIVED: HB A1C: 8.4 % — AB (ref <7.0)

## 2017-12-29 MED ORDER — PRAVASTATIN SODIUM 40 MG PO TABS: 40.0000 mg | ORAL_TABLET | ORAL | 1 refills | Status: DC

## 2017-12-29 MED ORDER — TRAZODONE HCL 50 MG PO TABS: 50.0000 mg | ORAL_TABLET | Freq: Every evening | ORAL | 0 refills | Status: DC | PRN

## 2017-12-29 MED ORDER — FUROSEMIDE 20 MG PO TABS: 20.0000 mg | ORAL_TABLET | Freq: Every day | ORAL | 3 refills | Status: DC

## 2017-12-29 MED ORDER — MELOXICAM 15 MG PO TABS: 15.0000 mg | ORAL_TABLET | Freq: Every day | ORAL | 0 refills | Status: DC

## 2017-12-29 NOTE — Progress Notes (Addendum)
Subjective:    Patient ID: John [name], male    DOB: [birth year], 72 y.o.   MRN: [id number]  Chief Complaint  Patient presents with  . Diabetes   Pt presents to the office today for chronic follow up. He is followed by Cardiologists every 6 months for CHF. Followed by Oncologists every 6 months for non-small lung cancer.  Diabetes  He presents for his follow-up diabetic visit. He has type 2 diabetes mellitus. His disease course has been stable. There are no hypoglycemic associated symptoms. Associated symptoms include blurred vision, fatigue and foot paresthesias. Pertinent negatives for diabetes include no visual change. Symptoms are worsening. Diabetic complications include heart disease and peripheral neuropathy. Risk factors for coronary artery disease include diabetes mellitus, dyslipidemia, male sex, obesity, hypertension and sedentary lifestyle. (Does not check BS at home) Eye exam is current (03/2017).  Hypertension  This is a chronic problem. The current episode started more than 1 year ago. The problem has been resolved since onset. The problem is controlled. Associated symptoms include blurred vision, malaise/fatigue and peripheral edema. Pertinent negatives include no shortness of breath. Risk factors for coronary artery disease include dyslipidemia, obesity and male gender. The current treatment provides moderate improvement. Hypertensive end-organ damage includes CAD/MI and heart failure. Identifiable causes of hypertension include a thyroid problem.  Thyroid Problem  Presents for follow-up visit. Symptoms include constipation and fatigue. Patient reports no depressed mood, diaphoresis, diarrhea or visual change. The symptoms have been stable. His past medical history is significant for heart failure and hyperlipidemia.  Congestive Heart Failure  Presents for follow-up visit. Associated symptoms include edema, fatigue, nocturia and unexpected weight change. Pertinent negatives  include no shortness of breath. The symptoms have been worsening.  Benign Prostatic Hypertrophy  This is a chronic problem. The current episode started more than 1 year ago. The problem has been rapidly improving since onset. Irritative symptoms include nocturia. Irritative symptoms do not include frequency or urgency.  Hyperlipidemia  This is a chronic problem. The current episode started more than 1 year ago. The problem is uncontrolled. Recent lipid tests were reviewed and are high. Pertinent negatives include no shortness of breath. Current antihyperlipidemic treatment includes statins.  Insomnia  Primary symptoms: frequent awakening, malaise/fatigue.  The current episode started more than one year. The onset quality is gradual. The problem occurs intermittently.  Arthritis  Presents for follow-up visit. He complains of pain and stiffness. Affected locations include the right hip and left hip. His pain is at a severity of 9/10. Associated symptoms include fatigue. Pertinent negatives include no diarrhea.  OSA Pt states he is using his old CPAP, but has appt for sleep study in November.  COPD Using Incruse daily.  Diabetic Neuropathy Pt states he does not have any feeling in his feet and feels like "I'm walking on a sponge"  Review of Systems  Constitutional: Positive for fatigue, malaise/fatigue and unexpected weight change. Negative for diaphoresis.  Eyes: Positive for blurred vision.  Respiratory: Negative for shortness of breath.   Gastrointestinal: Positive for constipation. Negative for diarrhea.  Genitourinary: Positive for nocturia. Negative for frequency and urgency.  Musculoskeletal: Positive for arthritis and stiffness.  Psychiatric/Behavioral: The patient has insomnia.   All other systems reviewed and are negative.      Objective:   Physical Exam  Constitutional: He is oriented to person, place, and time. He appears well-developed and well-nourished. No distress.  HENT:   Head: Normocephalic.  Right Ear: External ear normal.  Left Ear: External  ear normal.  Mouth/Throat: Oropharynx is clear and moist.  Eyes: Pupils are equal, round, and reactive to light. Right eye exhibits no discharge. Left eye exhibits no discharge.  Neck: Normal range of motion. Neck supple. No thyromegaly present.  Cardiovascular: Normal rate, regular rhythm, normal heart sounds and intact distal pulses.  No murmur heard. Pulmonary/Chest: Effort normal and breath sounds normal. No respiratory distress. He has no wheezes.  Abdominal: Soft. Bowel sounds are normal. He exhibits no distension. There is no tenderness.  Musculoskeletal: Normal range of motion. He exhibits edema (2+ BLE). He exhibits no tenderness.  Full ROM of hip, but pain with walking  Neurological: He is alert and oriented to person, place, and time. He has normal reflexes. No cranial nerve deficit.  Skin: Skin is warm and dry. No rash noted. No erythema.  Psychiatric: He has a normal mood and affect. His behavior is normal. Judgment and thought content normal.  Vitals reviewed.     BP 126/79   Pulse 97   Temp (!) 97 F (36.1 C) (Oral)   Ht 5' 6" (1.676 m)   Wt 188 lb 9.6 oz (85.5 kg)   BMI 30.44 kg/m      Assessment & Plan:  [name] comes in today with chief complaint of Diabetes   Diagnosis and orders addressed:  1. Type 2 diabetes mellitus with diabetic polyneuropathy, without long-term current use of insulin ([facility]) - Bayer DCA Hb A1c Waived - CMP14+EGFR  2. Non-small cell lung cancer, right ([facility]) - CMP14+EGFR  3. Hypothyroidism, unspecified type - CMP14+EGFR  4. Benign prostatic hyperplasia with weak urinary stream - CMP14+EGFR  5. Congestive heart failure, unspecified HF chronicity, unspecified heart failure type ([place]) Will start laxis today Low salt diet Weigh daily Call Cardiologists to schedule follow up - CMP14+EGFR - Compression stockings - furosemide (LASIX) 20 MG tablet;  Take 1 tablet (20 mg total) by mouth daily.  Dispense: 30 tablet; Refill: 3  6. OSA (obstructive sleep apnea) Keep sleep study appt - CMP14+EGFR  7. Hyponatremia - CMP14+EGFR  8. Essential hypertension - CMP14+EGFR  9. Panlobular emphysema ([place]) - CMP14+EGFR  10. Diabetic polyneuropathy associated with type 2 diabetes mellitus ([facility]) - CMP14+EGFR  11. Hyperlipidemia associated with type 2 diabetes mellitus ([facility]) - CMP14+EGFR - Lipid panel - pravastatin (PRAVACHOL) 40 MG tablet; Take 1 tablet (40 mg total) by mouth every 3 (three) days. M-W-F  Dispense: 90 tablet; Refill: 1  12. Colon cancer screening - CMP14+EGFR - Cologuard  13. Screening for malignant neoplasm of the rectum - CMP14+EGFR - Cologuard  14. Need for hepatitis C screening test - CMP14+EGFR - Hepatitis C antibody  15. Current smoker Smoking cessation   16. Peripheral edema - Compression stockings - furosemide (LASIX) 20 MG tablet; Take 1 tablet (20 mg total) by mouth daily.  Dispense: 30 tablet; Refill: 3  17. Primary insomnia - traZODone (DESYREL) 50 MG tablet; Take 1 tablet (50 mg total) by mouth at bedtime as needed for sleep.  Dispense: 30 tablet; Refill: 0  18. Primary osteoarthritis involving multiple joints - meloxicam (MOBIC) 15 MG tablet; Take 1 tablet (15 mg total) by mouth daily.  Dispense: 30 tablet; Refill: 0   Labs pending Health Maintenance reviewed Diet and exercise encouraged  Follow up plan: 2 weeks recheck CHF, BP, and hip pain   Evelina Dun, FNP

## 2017-12-29 NOTE — Patient Instructions (Signed)
Heart Failure Heart failure is a condition in which the heart has trouble pumping blood because it has become weak or stiff. This means that the heart does not pump blood efficiently for the body to work well. For some people with heart failure, fluid may back up into the lungs and there may be swelling (edema) in the lower legs. Heart failure is usually a long-term (chronic) condition. It is important for you to take good care of yourself and follow the treatment plan from your health care provider. What are the causes? This condition is caused by some health problems, including:  High blood pressure (hypertension). Hypertension causes the heart muscle to work harder than normal. High blood pressure eventually causes the heart to become stiff and weak.  Coronary artery disease (CAD). CAD is the buildup of cholesterol and fat (plaques) in the arteries of the heart.  Heart attack (myocardial infarction). Injured tissue, which is caused by the heart attack, does not contract as well and the heart's ability to pump blood is weakened.  Abnormal heart valves. When the heart valves do not open and close properly, the heart muscle must pump harder to keep the blood flowing.  Heart muscle disease (cardiomyopathy or myocarditis). Heart muscle disease is damage to the heart muscle from a variety of causes, such as drug or alcohol abuse, infections, or unknown causes. These can increase the risk of heart failure.  Lung disease. When the lungs do not work properly, the heart must work harder.  What increases the risk? Risk of heart failure increases as a person ages. This condition is also more likely to develop in people who:  Are overweight.  Are male.  Smoke or chew tobacco.  Abuse alcohol or illegal drugs.  Have taken medicines that can damage the heart, such as chemotherapy drugs.  Have diabetes. ? High blood sugar (glucose) is associated with high fat (lipid) levels in the blood. ? Diabetes  can also damage tiny blood vessels that carry nutrients to the heart muscle.  Have abnormal heart rhythms.  Have thyroid problems.  Have low blood counts (anemia).  What are the signs or symptoms? Symptoms of this condition include:  Shortness of breath with activity, such as when climbing stairs.  Persistent cough.  Swelling of the feet, ankles, legs, or abdomen.  Unexplained weight gain.  Difficulty breathing when lying flat (orthopnea).  Waking from sleep because of the need to sit up and get more air.  Rapid heartbeat.  Fatigue and loss of energy.  Feeling light-headed, dizzy, or close to fainting.  Loss of appetite.  Nausea.  Increased urination during the night (nocturia).  Confusion.  How is this diagnosed? This condition is diagnosed based on:  Medical history, symptoms, and a physical exam.  Diagnostic tests, which may include: ? Echocardiogram. ? Electrocardiogram (ECG). ? Chest X-ray. ? Blood tests. ? Exercise stress test. ? Radionuclide scans. ? Cardiac catheterization and angiogram.  How is this treated? Treatment for this condition is aimed at managing the symptoms of heart failure. Medicines, behavioral changes, or other treatments may be necessary to treat heart failure. Medicines These may include:  Angiotensin-converting enzyme (ACE) inhibitors. This type of medicine blocks the effects of a blood protein called angiotensin-converting enzyme. ACE inhibitors relax (dilate) the blood vessels and help to lower blood pressure.  Angiotensin receptor blockers (ARBs). This type of medicine blocks the actions of a blood protein called angiotensin. ARBs dilate the blood vessels and help to lower blood pressure.  Water   pills (diuretics). Diuretics cause the kidneys to remove salt and water from the blood. The extra fluid is removed through urination, leaving a lower volume of blood that the heart has to pump.  Beta blockers. These improve heart  muscle strength and they prevent the heart from beating too quickly.  Digoxin. This increases the force of the heartbeat.  Healthy behavior changes These may include:  Reaching and maintaining a healthy weight.  Stopping smoking or chewing tobacco.  Eating heart-healthy foods.  Limiting or avoiding alcohol.  Stopping use of street drugs (illegal drugs).  Physical activity.  Other treatments These may include:  Surgery to open blocked coronary arteries or repair damaged heart valves.  Placement of a biventricular pacemaker to improve heart muscle function (cardiac resynchronization therapy). This device paces both the right ventricle and left ventricle.  Placement of a device to treat serious abnormal heart rhythms (implantable cardioverter defibrillator, or ICD).  Placement of a device to improve the pumping ability of the heart (left ventricular assist device, or LVAD).  Heart transplant. This can cure heart failure, and it is considered for certain patients who do not improve with other therapies.  Follow these instructions at home: Medicines  Take over-the-counter and prescription medicines only as told by your health care provider. Medicines are important in reducing the workload of your heart, slowing the progression of heart failure, and improving your symptoms. ? Do not stop taking your medicine unless your health care provider told you to do that. ? Do not skip any dose of medicine. ? Refill your prescriptions before you run out of medicine. You need your medicines every day. Eating and drinking   Eat heart-healthy foods. Talk with a dietitian to make an eating plan that is right for you. ? Choose foods that contain no trans fat and are low in saturated fat and cholesterol. Healthy choices include fresh or frozen fruits and vegetables, fish, lean meats, legumes, fat-free or low-fat dairy products, and whole-grain or high-fiber foods. ? Limit salt (sodium) if  directed by your health care provider. Sodium restriction may reduce symptoms of heart failure. Ask a dietitian to recommend heart-healthy seasonings. ? Use healthy cooking methods instead of frying. Healthy methods include roasting, grilling, broiling, baking, poaching, steaming, and stir-frying.  Limit your fluid intake if directed by your health care provider. Fluid restriction may reduce symptoms of heart failure. Lifestyle  Stop smoking or using chewing tobacco. Nicotine and tobacco can damage your heart and your blood vessels. Do not use nicotine gum or patches before talking to your health care provider.  Limit alcohol intake to no more than 1 drink per day for non-pregnant women and 2 drinks per day for men. One drink equals 12 oz of beer, 5 oz of wine, or 1 oz of hard liquor. ? Drinking more than that is harmful to your heart. Tell your health care provider if you drink alcohol several times a week. ? Talk with your health care provider about whether any level of alcohol use is safe for you. ? If your heart has already been damaged by alcohol or you have severe heart failure, drinking alcohol should be stopped completely.  Stop use of illegal drugs.  Lose weight if directed by your health care provider. Weight loss may reduce symptoms of heart failure.  Do moderate physical activity if directed by your health care provider. People who are elderly and people with severe heart failure should consult with a health care provider for physical activity recommendations.   Monitor important information  Weigh yourself every day. Keeping track of your weight daily helps you to notice excess fluid sooner. ? Weigh yourself every morning after you urinate and before you eat breakfast. ? Wear the same amount of clothing each time you weigh yourself. ? Record your daily weight. Provide your health care provider with your weight record.  Monitor and record your blood pressure as told by your health  care provider.  Check your pulse as told by your health care provider. Dealing with extreme temperatures  If the weather is extremely hot: ? Avoid vigorous physical activity. ? Use air conditioning or fans or seek a cooler location. ? Avoid caffeine and alcohol. ? Wear loose-fitting, lightweight, and light-colored clothing.  If the weather is extremely cold: ? Avoid vigorous physical activity. ? Layer your clothes. ? Wear mittens or gloves, a hat, and a scarf when you go outside. ? Avoid alcohol. General instructions  Manage other health conditions such as hypertension, diabetes, thyroid disease, or abnormal heart rhythms as told by your health care provider.  Learn to manage stress. If you need help to do this, ask your health care provider.  Plan rest periods when fatigued.  Get ongoing education and support as needed.  Participate in or seek rehabilitation as needed to maintain or improve independence and quality of life.  Stay up to date with immunizations. Keeping current on pneumococcal and influenza immunizations is especially important to prevent respiratory infections.  Keep all follow-up visits as told by your health care provider. This is important. Contact a health care provider if:  You have a rapid weight gain.  You have increasing shortness of breath that is unusual for you.  You are unable to participate in your usual physical activities.  You tire easily.  You cough more than normal, especially with physical activity.  You have any swelling or more swelling in areas such as your hands, feet, ankles, or abdomen.  You are unable to sleep because it is hard to breathe.  You feel like your heart is beating quickly (palpitations).  You become dizzy or light-headed when you stand up. Get help right away if:  You have difficulty breathing.  You notice or your family notices a change in your awareness, such as having trouble staying awake or having  difficulty with concentration.  You have pain or discomfort in your chest.  You have an episode of fainting (syncope). This information is not intended to replace advice given to you by your health care provider. Make sure you discuss any questions you have with your health care provider. Document Released: 03/24/2005 Document Revised: 11/27/2015 Document Reviewed: 10/17/2015 Elsevier Interactive Patient Education  2018 [employer]  

## 2017-12-30 ENCOUNTER — Other Ambulatory Visit: Payer: Self-pay | Admitting: Family

## 2017-12-30 LAB — LIPID PANEL
Chol/HDL Ratio: 5.1 ratio — ABNORMAL HIGH (ref 0.0–5.0)
Cholesterol, Total: 127 mg/dL (ref 100–199)
HDL: 25 mg/dL — ABNORMAL LOW (ref >39)
LDL CALC: 72 mg/dL (ref 0–99)
Triglycerides: 148 mg/dL (ref 0–149)
VLDL CHOLESTEROL CAL: 30 mg/dL (ref 5–40)

## 2017-12-30 LAB — CMP14+EGFR
ALBUMIN: 3.6 g/dL (ref 3.5–4.8)
ALT: 21 IU/L (ref 0–44)
AST: 19 IU/L (ref 0–40)
Albumin/Globulin Ratio: 1.2 (ref 1.2–2.2)
Alkaline Phosphatase: 97 IU/L (ref 39–117)
BUN/Creatinine Ratio: 16 (ref 10–24)
BUN: 16 mg/dL (ref 8–27)
Bilirubin Total: <0.2 mg/dL (ref 0.0–1.2)
CALCIUM: 8.8 mg/dL (ref 8.6–10.2)
CHLORIDE: 97 mmol/L (ref 96–106)
CO2: 25 mmol/L (ref 20–29)
CREATININE: 0.99 mg/dL (ref 0.76–1.27)
GFR calc non Af Amer: 76 mL/min/{1.73_m2} (ref >59)
GFR, EST AFRICAN AMERICAN: 88 mL/min/{1.73_m2} (ref >59)
Globulin, Total: 3 g/dL (ref 1.5–4.5)
Glucose: 173 mg/dL — ABNORMAL HIGH (ref 65–99)
Potassium: 4.5 mmol/L (ref 3.5–5.2)
Sodium: 138 mmol/L (ref 134–144)
TOTAL PROTEIN: 6.6 g/dL (ref 6.0–8.5)

## 2017-12-30 LAB — HEPATITIS C ANTIBODY

## 2017-12-30 MED ORDER — METFORMIN HCL ER 750 MG PO TB24: 750.0000 mg | ORAL_TABLET | Freq: Every day | ORAL | 11 refills | Status: DC

## 2018-01-11 ENCOUNTER — Other Ambulatory Visit (HOSPITAL_COMMUNITY)
Admission: RE | Admit: 2018-01-11 | Discharge: 2018-01-11 | Disposition: A | Discharge status: Home / Self Care | Payer: Medicare Other | Source: Ambulatory Visit | Attending: Cardiovascular Disease | Admitting: Cardiovascular Disease

## 2018-01-11 ENCOUNTER — Encounter: Payer: Self-pay | Admitting: Cardiovascular Disease

## 2018-01-11 ENCOUNTER — Ambulatory Visit (INDEPENDENT_AMBULATORY_CARE_PROVIDER_SITE_OTHER): Payer: Medicare Other | Admitting: Cardiovascular Disease

## 2018-01-11 ENCOUNTER — Encounter

## 2018-01-11 VITALS — BP 122/78 | HR 91 | Ht 67.0 in | Wt 181.0 lb

## 2018-01-11 DIAGNOSIS — I10 Essential (primary) hypertension: Secondary | ICD-10-CM

## 2018-01-11 DIAGNOSIS — I3139 Other pericardial effusion (noninflammatory): Secondary | ICD-10-CM

## 2018-01-11 DIAGNOSIS — G4730 Sleep apnea, unspecified: Secondary | ICD-10-CM | POA: Diagnosis not present

## 2018-01-11 DIAGNOSIS — I2510 Atherosclerotic heart disease of native coronary artery without angina pectoris: Secondary | ICD-10-CM

## 2018-01-11 DIAGNOSIS — R079 Chest pain, unspecified: Secondary | ICD-10-CM

## 2018-01-11 DIAGNOSIS — E785 Hyperlipidemia, unspecified: Secondary | ICD-10-CM | POA: Diagnosis not present

## 2018-01-11 DIAGNOSIS — I5032 Chronic diastolic (congestive) heart failure: Secondary | ICD-10-CM

## 2018-01-11 DIAGNOSIS — R0609 Other forms of dyspnea: Secondary | ICD-10-CM

## 2018-01-11 DIAGNOSIS — I313 Pericardial effusion (noninflammatory): Secondary | ICD-10-CM

## 2018-01-11 LAB — BASIC METABOLIC PANEL
ANION GAP: 8 (ref 5–15)
BUN: 17 mg/dL (ref 8–23)
CALCIUM: 8.9 mg/dL (ref 8.9–10.3)
CHLORIDE: 99 mmol/L (ref 98–111)
CO2: 29 mmol/L (ref 22–32)
CREATININE: 0.96 mg/dL (ref 0.61–1.24)
GFR calc Af Amer: >60 mL/min (ref >60)
GFR calc non Af Amer: >60 mL/min (ref >60)
Glucose, Bld: 183 mg/dL — ABNORMAL HIGH (ref 70–99)
Potassium: 3.7 mmol/L (ref 3.5–5.1)
SODIUM: 136 mmol/L (ref 135–145)

## 2018-01-11 MED ORDER — METOPROLOL TARTRATE 50 MG PO TABS: ORAL_TABLET | ORAL | 0 refills | Status: DC

## 2018-01-11 MED ORDER — POTASSIUM CHLORIDE ER 10 MEQ PO TBCR: 10.0000 meq | EXTENDED_RELEASE_TABLET | Freq: Every day | ORAL | 3 refills | Status: DC

## 2018-01-11 NOTE — Addendum Note (Signed)
Addended by: Barbarann Ehlers A on: 01/11/2018 03:06 PM   Modules accepted: Orders

## 2018-01-11 NOTE — Progress Notes (Signed)
SUBJECTIVE: The patient presents for routine follow-up.  He was started on Lasix by his PCP on 12/29/2017 and it was recommended he follow-up with [place].  His weight is down 7 pounds since that visit.  I previously put him on Imdur but he did not tolerate it as it made him feel depressed and sleepy.  Limited echocardiogram on 07/09/2017 demonstrated normal left ventricular systolic function and regional wall motion, LVEF 60 to 65%.  There was a trivial pericardial effusion.  He has stage IIIa non-small cell lung cancer, advanced COPD with baseline exertional dyspnea, and sleep apnea and uses CPAP.  He was put on Incruse Ellipta by his PCP and shortness of breath has improved to some degree.  His leg swelling has resolved.  He currently denies chest pain.  He does continue to have some degree of exertional dyspnea when walking about 50 to 75 feet to the chicken coop.  He denies orthopnea and paroxysmal nocturnal dyspnea.    Review of Systems: As per "subjective", otherwise negative.  No Known Allergies  Current Outpatient Medications  Medication Sig Dispense Refill  . aspirin EC 81 MG tablet Take 81 mg by mouth daily.    . cloNIDine (CATAPRES) 0.1 MG tablet Take 1 tablet (0.1 mg total) by mouth 3 (three) times daily. 90 tablet 11  . fluticasone (FLONASE) 50 MCG/ACT nasal spray Place 2 sprays into both nostrils daily. 16 g 6  . furosemide (LASIX) 20 MG tablet Take 1 tablet (20 mg total) by mouth daily. 30 tablet 3  . gabapentin (NEURONTIN) 300 MG capsule Take 1 capsule in Am and 2 at night 270 capsule 3  . INCRUSE ELLIPTA 62.5 MCG/INH AEPB TAKE 1 PUFF BY MOUTH EVERY DAY 90 each 0  . levothyroxine (SYNTHROID, LEVOTHROID) 50 MCG tablet Take 1 tablet (50 mcg total) by mouth daily. 90 tablet 3  . linagliptin (TRADJENTA) 5 MG TABS tablet Take 1 tablet (5 mg total) by mouth daily. 90 tablet 3  . lisinopril-hydrochlorothiazide (PRINZIDE,ZESTORETIC) 20-25 MG tablet Take 1 tablet by mouth  daily. 90 tablet 3  . LORazepam (ATIVAN) 0.5 MG tablet TAKE 1 TABLET BY MOUTH TWICE DAILY AS NEEDED 30 tablet 2  . meloxicam (MOBIC) 15 MG tablet Take 1 tablet (15 mg total) by mouth daily. 30 tablet 0  . meloxicam (MOBIC) 7.5 MG tablet TAKE 1 TABLET (7.5 MG TOTAL) BY MOUTH DAILY AS NEEDED FOR PAIN (BACK PAIN). 30 tablet 2  . metFORMIN (GLUCOPHAGE XR) 750 MG 24 hr tablet Take 1 tablet (750 mg total) by mouth daily with breakfast. 30 tablet 11  . ondansetron (ZOFRAN) 8 MG tablet Take 1 tablet (8 mg total) by mouth every 8 (eight) hours as needed for nausea or vomiting. 60 tablet 1  . pravastatin (PRAVACHOL) 40 MG tablet Take 1 tablet (40 mg total) by mouth every 3 (three) days. M-W-F 90 tablet 1  . prochlorperazine (COMPAZINE) 10 MG tablet Take 1 tablet (10 mg total) by mouth every 6 (six) hours as needed for nausea or vomiting. 60 tablet 2  . tamsulosin (FLOMAX) 0.4 MG CAPS capsule TAKE 2 CAPSULES (0.8 MG TOTAL) BY MOUTH DAILY. 180 capsule 3  . traZODone (DESYREL) 50 MG tablet Take 1 tablet (50 mg total) by mouth at bedtime as needed for sleep. 30 tablet 0  . VENTOLIN HFA 108 (90 Base) MCG/ACT inhaler Please specify directions, refills and quantity 1 Inhaler 1   No current facility-administered medications for this visit.  Past Medical History:  Diagnosis Date  . Adenocarcinoma of lung, right ([place]) 04/18/2016  . Anxiety   . Arthritis   . CHF (congestive heart failure) ([place])   . COPD (chronic obstructive pulmonary disease) ([place])   . Depression   . Diabetes mellitus without complication ([facility])    no meds  . DM (diabetes mellitus) ([place]) 07/09/2016  . Dyspnea   . History of kidney stones   . Hyperlipidemia   . Hypertension   . Macular degeneration   . Neuropathy   . Non-small cell lung cancer, right ([place]) 04/18/2016  . Prostatitis   . Pulmonary nodule, left 07/16/2016  . Sleep apnea    cpap    Past Surgical History:  Procedure Laterality Date  . CATARACT EXTRACTION, BILATERAL  Bilateral   . NO PAST SURGERIES    . PORTACATH PLACEMENT Left 06/13/2016   Procedure: INSERTION PORT-A-CATH;  Surgeon: Aviva Signs, MD;  Location: [facility];  Service: General;  Laterality: Left;  Marland Kitchen VIDEO BRONCHOSCOPY WITH ENDOBRONCHIAL NAVIGATION N/A 05/28/2016   Procedure: VIDEO BRONCHOSCOPY WITH ENDOBRONCHIAL NAVIGATION;  Surgeon: [name], MD;  Location: [facility];  Service: Thoracic;  Laterality: N/A;  . VIDEO BRONCHOSCOPY WITH ENDOBRONCHIAL ULTRASOUND N/A 05/28/2016   Procedure: VIDEO BRONCHOSCOPY WITH ENDOBRONCHIAL ULTRASOUND;  Surgeon: [name], MD;  Location: [facility];  Service: Thoracic;  Laterality: N/A;    Social History   Socioeconomic History  . Marital status: Divorced    Spouse name: Not on file  . Number of children: 5  . Years of education: 10  . Highest education level: 10th grade  Occupational History  . Occupation: Retired  Scientific laboratory technician  . Financial resource strain: Not very hard  . Food insecurity:    Worry: Never true    Inability: Never true  . Transportation needs:    Medical: No    Non-medical: No  Tobacco Use  . Smoking status: Current Every Day Smoker    Packs/day: 0.10    Years: 55.00    Pack years: 5.50    Types: Cigarettes    Start date: 03/11/1961  . Smokeless tobacco: Never Used  . Tobacco comment: smoking 1 to 2 cigarettes per day  Substance and Sexual Activity  . Alcohol use: No  . Drug use: No  . Sexual activity: Yes    Birth control/protection: None  Lifestyle  . Physical activity:    Days per week: 0 days    Minutes per session: 0 min  . Stress: To some extent  Relationships  . Social connections:    Talks on phone: More than three times a week    Gets together: More than three times a week    Attends religious service: Never    Active member of club or organization: No    Attends meetings of clubs or organizations: Never    Relationship status: Divorced  . Intimate partner violence:    Fear of current or ex  partner: Not on file    Emotionally abused: Not on file    Physically abused: Not on file    Forced sexual activity: Not on file  Other Topics Concern  . Not on file  Social History Narrative  . Not on file     Vitals:   01/11/18 0940  BP: 122/78  Pulse: 91  SpO2: 95%  Weight: 181 lb (82.1 kg)  Height: 5\' 7"  (1.702 m)    Wt Readings from Last 3 Encounters:  01/11/18 181 lb (82.1 kg)  12/29/17 188 lb 9.6 oz (85.5 kg)  12/10/17 182 lb 3.2 oz (82.6 kg)     PHYSICAL EXAM General: NAD HEENT: Normal. Neck: No JVD, no thyromegaly. Lungs: Diminished sounds throughout, no crackles or wheezes. CV: Regular rate and rhythm, normal S1/S2, no S3/S4, no murmur. No pretibial or periankle edema.     Abdomen: Soft, nontender, no distention.  Neurologic: Alert and oriented.  Psych: Normal affect. Skin: Normal. Musculoskeletal: No gross deformities.    ECG: Reviewed above under Subjective   Labs: Lab Results  Component Value Date/Time   K 4.5 12/29/2017 11:18 AM   BUN 16 12/29/2017 11:18 AM   CREATININE 0.99 12/29/2017 11:18 AM   ALT 21 12/29/2017 11:18 AM   TSH 2.220 12/10/2017 06:34 PM   HGB 12.2 (L) 12/10/2017 06:34 PM     Lipids: Lab Results  Component Value Date/Time   LDLCALC 72 12/29/2017 11:18 AM   CHOL 127 12/29/2017 11:18 AM   TRIG 148 12/29/2017 11:18 AM   HDL 25 (L) 12/29/2017 11:18 AM       ASSESSMENT AND PLAN: 1.  Chronic diastolic heart failure: Currently euvolemic on Lasix 20 mg daily.  I will obtain a follow-up echocardiogram to reevaluate cardiac structure and function.  I will check a basic metabolic panel to make sure potassium levels are normal and to assess renal function.  I will also prescribe supplemental potassium 10 mEq daily.  It was normal on 12/29/2017 as noted above.  2.  Chest pain and exertional dyspnea: While symptoms may be due to COPD, he has several cardiovascular risk factors.  Lexiscan Myoview stress test was low risk with small  area of inferior ischemia in January 2018.    I have attempted to manage medically.  Continue aspirin and statin.  He did not tolerate metoprolol 25 mg twice daily nor Imdur. I will obtain coronary CT angiography to assess for hemodynamically significant coronary artery disease.  3.  Hypertension: Controlled on present therapy.  No changes.  4.  Hyperlipidemia: Lipids reviewed above.  Continue pravastatin.  5.  Pericardial effusion: This was trivial in size in April 2019.  I will obtain a follow-up echocardiogram for reassessment.  6.  Sleep apnea: Currently on CPAP.     Disposition: Follow up 3 months   [name], M.D., F.A.C.C.

## 2018-01-11 NOTE — Patient Instructions (Addendum)
Medication Instructions:  Take potassium 10 meq daily  If you need a refill on your cardiac medications before your next appointment, please call your pharmacy.   Lab work: Probation officer today  If you have labs (blood work) drawn today and your tests are completely normal, you will receive your results only by: Marland Kitchen MyChart Message (if you have MyChart) OR . A paper copy in the mail If you have any lab test that is abnormal or we need to change your treatment, we will call you to review the results.  Testing/Procedures: Your physician has requested that you have an echocardiogram. Echocardiography is a painless test that uses sound waves to create images of your heart. It provides your doctor with information about the size and shape of your heart and how well your heart's chambers and valves are working. This procedure takes approximately one hour. There are no restrictions for this procedure.   Follow-Up: At [facility], you and your health needs are our priority.  As part of our continuing mission to provide you with exceptional heart care, we have created designated Provider Care Teams.  These Care Teams include your primary Cardiologist (physician) and Advanced Practice Providers (APPs -  Physician Assistants and Nurse Practitioners) who all work together to provide you with the care you need, when you need it.  You will need a follow up appointment in3 months.  You may see [name], MD     Any Other Special Instructions Will Be Listed Below (If Applicable).   Please arrive at the [facility] main entrance of [facility] at      AM (30-45 minutes prior to test start time)  [facility] [place], [place] [address] [phone]  Proceed to the [facility] Radiology Department (First Floor).  Please follow these instructions carefully (unless otherwise directed):  Hold all erectile dysfunction medications at least 48 hours prior to  test.  On the Night Before the Test: . Be sure to Drink plenty of water. . Do not consume any caffeinated/decaffeinated beverages or chocolate 12 hours prior to your test. . Do not take any antihistamines 12 hours prior to your test. . If you take Metformin do not take 24 hours prior to test. On the Day of the Test: . Drink plenty of water. Do not drink any water within one hour of the test. . Do not eat any food 4 hours prior to the test. . You may take your regular medications prior to the test.  . Take metoprolol (Lopressor) two hours prior to test. . HOLD Furosemide/Hydrochlorothiazide morning of the test.         -Take metoprolol (Lopressor) 2 hours prior to test (if applicable).       After the Test: . Drink plenty of water. . After receiving IV contrast, you may experience a mild flushed feeling. This is normal. . On occasion, you may experience a mild rash up to 24 hours after the test. This is not dangerous. If this occurs, you can take Benadryl 25 mg and increase your fluid intake. . If you experience trouble breathing, this can be serious. If it is severe call 911 IMMEDIATELY. If it is mild, please call our office. . If you take any of these medications: Glipizide/Metformin, Avandament, Glucavance, please do not take 48 hours after completing test.

## 2018-01-12 ENCOUNTER — Ambulatory Visit (INDEPENDENT_AMBULATORY_CARE_PROVIDER_SITE_OTHER): Payer: Medicare Other | Admitting: Family

## 2018-01-12 ENCOUNTER — Encounter: Payer: Self-pay | Admitting: Family

## 2018-01-12 VITALS — BP 111/60 | HR 104 | Temp 97.8°F | Ht 67.0 in | Wt 181.6 lb

## 2018-01-12 DIAGNOSIS — I509 Heart failure, unspecified: Secondary | ICD-10-CM | POA: Diagnosis not present

## 2018-01-12 DIAGNOSIS — I10 Essential (primary) hypertension: Secondary | ICD-10-CM | POA: Diagnosis not present

## 2018-01-12 DIAGNOSIS — E1142 Type 2 diabetes mellitus with diabetic polyneuropathy: Secondary | ICD-10-CM

## 2018-01-12 DIAGNOSIS — I2510 Atherosclerotic heart disease of native coronary artery without angina pectoris: Secondary | ICD-10-CM | POA: Diagnosis not present

## 2018-01-12 MED ORDER — EMPAGLIFLOZIN 10 MG PO TABS: 10.0000 mg | ORAL_TABLET | Freq: Every day | ORAL | 1 refills | Status: DC

## 2018-01-12 MED ORDER — METFORMIN HCL ER 500 MG PO TB24: 500.0000 mg | ORAL_TABLET | Freq: Every day | ORAL | 1 refills | Status: DC

## 2018-01-12 NOTE — Progress Notes (Signed)
Subjective:    Patient ID: John [name], male    DOB: [birth year], 72 y.o.   MRN: [id number]  Chief Complaint  Patient presents with  . Diabetes    recheck   Pt presents to the office to recheck HTN, CHF, and DM today. He was seen on 12/29/17 and started on Lasix 20 mg daily. He reports he has lost 5 lb since then and the swelling in bilateral ankles are improved. He saw his Cardiologists yesterday who started him on a potassium 10 meq tablet daily.   Pt states since increasing his metformin to 750 mg he has diarrhea and can not tolerate the increase dose.  Hypertension  This is a chronic problem. The current episode started more than 1 year ago. The problem has been resolved since onset. The problem is controlled. Associated symptoms include chest pain and malaise/fatigue. Pertinent negatives include no headaches or peripheral edema (Improved since starting lasix). Risk factors for coronary artery disease include diabetes mellitus, dyslipidemia, obesity, male gender and sedentary lifestyle. Hypertensive end-organ damage includes CAD/MI.  Congestive Heart Failure  Presents for follow-up visit. Associated symptoms include chest pain and fatigue. The symptoms have been stable.  Diabetes  He presents for his follow-up diabetic visit. He has type 2 diabetes mellitus. His disease course has been worsening. Pertinent negatives for hypoglycemia include no headaches. Associated symptoms include chest pain, fatigue and visual change. Diabetic complications include heart disease and peripheral neuropathy. Risk factors for coronary artery disease include dyslipidemia, male sex, hypertension and sedentary lifestyle.      Review of Systems  Constitutional: Positive for fatigue and malaise/fatigue.  Cardiovascular: Positive for chest pain.  Neurological: Negative for headaches.  All other systems reviewed and are negative.      Objective:   Physical Exam  Constitutional: He is oriented to  person, place, and time. He appears well-developed and well-nourished. No distress.  HENT:  Head: Normocephalic.  Right Ear: External ear normal.  Left Ear: External ear normal.  Mouth/Throat: Oropharynx is clear and moist.  Eyes: Pupils are equal, round, and reactive to light. Right eye exhibits no discharge. Left eye exhibits no discharge.  Neck: Normal range of motion. Neck supple. No thyromegaly present.  Cardiovascular: Normal rate, regular rhythm, normal heart sounds and intact distal pulses.  No murmur heard. Pulmonary/Chest: Effort normal and breath sounds normal. No respiratory distress. He has no wheezes.  Abdominal: Soft. Bowel sounds are normal. He exhibits no distension. There is no tenderness.  Musculoskeletal: Normal range of motion. He exhibits no edema or tenderness.  Neurological: He is alert and oriented to person, place, and time. He has normal reflexes. No cranial nerve deficit.  Skin: Skin is warm and dry. No rash noted. No erythema.  Psychiatric: He has a normal mood and affect. His behavior is normal. Judgment and thought content normal.  Vitals reviewed.     BP 111/60   Pulse (!) 104   Temp 97.8 F (36.6 C) (Oral)   Ht 5\' 7"  (1.702 m)   Wt 181 lb 9.6 oz (82.4 kg)   BMI 28.44 kg/m      Assessment & Plan:  John [name] comes in today with chief complaint of Diabetes (recheck)   Diagnosis and orders addressed:  1. Essential hypertension  2. Congestive heart failure, unspecified HF chronicity, unspecified heart failure type ([place]) Continue medications Low salt diet Weight daily  3. Type 2 diabetes mellitus with diabetic polyneuropathy, without long-term current use of insulin ([facility]) Will change metformin  to 500 mg XR from 750 mg XR and add Jardiance 10 mg today Low carb diet  RTO in 2 months  - metFORMIN (GLUCOPHAGE XR) 500 MG 24 hr tablet; Take 1 tablet (500 mg total) by mouth daily with breakfast.  Dispense: 90 tablet; Refill: 1 - empagliflozin  (JARDIANCE) 10 MG TABS tablet; Take 10 mg by mouth daily.  Dispense: 90 tablet; Refill: 1   Labs pending Health Maintenance reviewed Diet and exercise encouraged  Follow up plan: 2 months    Evelina Dun, FNP

## 2018-01-12 NOTE — Patient Instructions (Signed)
Diabetes Mellitus and Nutrition When you have diabetes (diabetes mellitus), it is very important to have healthy eating habits because your blood sugar (glucose) levels are greatly affected by what you eat and drink. Eating healthy foods in the appropriate amounts, at about the same times every day, can help you:  Control your blood glucose.  Lower your risk of heart disease.  Improve your blood pressure.  Reach or maintain a healthy weight.  Every person with diabetes is different, and each person has different needs for a meal plan. Your health care provider may recommend that you work with a diet and nutrition specialist (dietitian) to make a meal plan that is best for you. Your meal plan may vary depending on factors such as:  The calories you need.  The medicines you take.  Your weight.  Your blood glucose, blood pressure, and cholesterol levels.  Your activity level.  Other health conditions you have, such as heart or kidney disease.  How do carbohydrates affect me? Carbohydrates affect your blood glucose level more than any other type of food. Eating carbohydrates naturally increases the amount of glucose in your blood. Carbohydrate counting is a method for keeping track of how many carbohydrates you eat. Counting carbohydrates is important to keep your blood glucose at a healthy level, especially if you use insulin or take certain oral diabetes medicines. It is important to know how many carbohydrates you can safely have in each meal. This is different for every person. Your dietitian can help you calculate how many carbohydrates you should have at each meal and for snack. Foods that contain carbohydrates include:  Bread, cereal, rice, pasta, and crackers.  Potatoes and corn.  Peas, beans, and lentils.  Milk and yogurt.  Fruit and juice.  Desserts, such as cakes, cookies, ice cream, and candy.  How does alcohol affect me? Alcohol can cause a sudden decrease in blood  glucose (hypoglycemia), especially if you use insulin or take certain oral diabetes medicines. Hypoglycemia can be a life-threatening condition. Symptoms of hypoglycemia (sleepiness, dizziness, and confusion) are similar to symptoms of having too much alcohol. If your health care provider says that alcohol is safe for you, follow these guidelines:  Limit alcohol intake to no more than 1 drink per day for nonpregnant women and 2 drinks per day for men. One drink equals 12 oz of beer, 5 oz of wine, or 1 oz of hard liquor.  Do not drink on an empty stomach.  Keep yourself hydrated with water, diet soda, or unsweetened iced tea.  Keep in mind that regular soda, juice, and other mixers may contain a lot of sugar and must be counted as carbohydrates.  What are tips for following this plan? Reading food labels  Start by checking the serving size on the label. The amount of calories, carbohydrates, fats, and other nutrients listed on the label are based on one serving of the food. Many foods contain more than one serving per package.  Check the total grams (g) of carbohydrates in one serving. You can calculate the number of servings of carbohydrates in one serving by dividing the total carbohydrates by 15. For example, if a food has 30 g of total carbohydrates, it would be equal to 2 servings of carbohydrates.  Check the number of grams (g) of saturated and trans fats in one serving. Choose foods that have low or no amount of these fats.  Check the number of milligrams (mg) of sodium in one serving. Most people   should limit total sodium intake to less than 2,300 mg per day.  Always check the nutrition information of foods labeled as "low-fat" or "nonfat". These foods may be higher in added sugar or refined carbohydrates and should be avoided.  Talk to your dietitian to identify your daily goals for nutrients listed on the label. Shopping  Avoid buying canned, premade, or processed foods. These  foods tend to be high in fat, sodium, and added sugar.  Shop around the outside edge of the grocery store. This includes fresh fruits and vegetables, bulk grains, fresh meats, and fresh dairy. Cooking  Use low-heat cooking methods, such as baking, instead of high-heat cooking methods like deep frying.  Cook using healthy oils, such as olive, canola, or sunflower oil.  Avoid cooking with butter, cream, or high-fat meats. Meal planning  Eat meals and snacks regularly, preferably at the same times every day. Avoid going long periods of time without eating.  Eat foods high in fiber, such as fresh fruits, vegetables, beans, and whole grains. Talk to your dietitian about how many servings of carbohydrates you can eat at each meal.  Eat 4-6 ounces of lean protein each day, such as lean meat, chicken, fish, eggs, or tofu. 1 ounce is equal to 1 ounce of meat, chicken, or fish, 1 egg, or 1/4 cup of tofu.  Eat some foods each day that contain healthy fats, such as avocado, nuts, seeds, and fish. Lifestyle   Check your blood glucose regularly.  Exercise at least 30 minutes 5 or more days each week, or as told by your health care provider.  Take medicines as told by your health care provider.  Do not use any products that contain nicotine or tobacco, such as cigarettes and e-cigarettes. If you need help quitting, ask your health care provider.  Work with a counselor or diabetes educator to identify strategies to manage stress and any emotional and social challenges. What are some questions to ask my health care provider?  Do I need to meet with a diabetes educator?  Do I need to meet with a dietitian?  What number can I call if I have questions?  When are the best times to check my blood glucose? Where to find more information:  American Diabetes Association: diabetes.org/food-and-fitness/food  Academy of Nutrition and Dietetics:  www.eatright.org/resources/health/diseases-and-conditions/diabetes  National Institute of Diabetes and Digestive and Kidney Diseases (NIH): www.niddk.nih.gov/health-information/diabetes/overview/diet-eating-physical-activity Summary  A healthy meal plan will help you control your blood glucose and maintain a healthy lifestyle.  Working with a diet and nutrition specialist (dietitian) can help you make a meal plan that is best for you.  Keep in mind that carbohydrates and alcohol have immediate effects on your blood glucose levels. It is important to count carbohydrates and to use alcohol carefully. This information is not intended to replace advice given to you by your health care provider. Make sure you discuss any questions you have with your health care provider. Document Released: 12/19/2004 Document Revised: 04/28/2016 Document Reviewed: 04/28/2016 Elsevier Interactive Patient Education  2018 [employer]  

## 2018-01-15 ENCOUNTER — Ambulatory Visit (HOSPITAL_COMMUNITY)
Admission: RE | Admit: 2018-01-15 | Discharge: 2018-01-15 | Disposition: A | Discharge status: Home / Self Care | Payer: Medicare Other | Source: Ambulatory Visit | Attending: Cardiovascular Disease | Admitting: Cardiovascular Disease

## 2018-01-15 DIAGNOSIS — J449 Chronic obstructive pulmonary disease, unspecified: Secondary | ICD-10-CM | POA: Diagnosis not present

## 2018-01-15 DIAGNOSIS — E785 Hyperlipidemia, unspecified: Secondary | ICD-10-CM | POA: Insufficient documentation

## 2018-01-15 DIAGNOSIS — I110 Hypertensive heart disease with heart failure: Secondary | ICD-10-CM | POA: Insufficient documentation

## 2018-01-15 DIAGNOSIS — E119 Type 2 diabetes mellitus without complications: Secondary | ICD-10-CM | POA: Insufficient documentation

## 2018-01-15 DIAGNOSIS — Z85118 Personal history of other malignant neoplasm of bronchus and lung: Secondary | ICD-10-CM | POA: Insufficient documentation

## 2018-01-15 DIAGNOSIS — I429 Cardiomyopathy, unspecified: Secondary | ICD-10-CM | POA: Insufficient documentation

## 2018-01-15 DIAGNOSIS — G4733 Obstructive sleep apnea (adult) (pediatric): Secondary | ICD-10-CM | POA: Diagnosis not present

## 2018-01-15 DIAGNOSIS — I5032 Chronic diastolic (congestive) heart failure: Secondary | ICD-10-CM | POA: Diagnosis not present

## 2018-01-15 NOTE — Progress Notes (Signed)
*  PRELIMINARY RESULTS* Echocardiogram 2D Echocardiogram has been performed.  Samuel Germany 01/15/2018, 11:18 AM

## 2018-01-19 ENCOUNTER — Telehealth: Payer: Self-pay | Admitting: *Deleted

## 2018-01-19 NOTE — Telephone Encounter (Signed)
Notes recorded by Laurine Blazer, LPN on 25/42/7062 at 4:20 PM EDT Patient notified. Copy to pmd. Follow up scheduled for 04/19/18 in [place] office.   ------  Notes recorded by Laurine Blazer, LPN on 37/62/8315 at 4:15 PM EDT [place] [facility] Lenise Herald, MD Laurine Blazer, LPN Possible small collection of fluid around the heart. Nothing significant.    ------  Notes recorded by Herminio Commons, MD on 01/18/2018 at 8:35 AM EDT This study demonstrates: Main cardiac chamber has normal pumping function. Medication changes / Follow up studies / Other recommendations:  None Please send results to the PCP: [name], [place], MD 01/18/2018 8:34 AM

## 2018-01-20 ENCOUNTER — Other Ambulatory Visit: Payer: Self-pay | Admitting: Family

## 2018-01-20 DIAGNOSIS — F5101 Primary insomnia: Secondary | ICD-10-CM

## 2018-01-21 NOTE — Telephone Encounter (Signed)
Last seen 01/12/18  [facility]

## 2018-01-25 ENCOUNTER — Other Ambulatory Visit: Payer: Self-pay

## 2018-01-25 DIAGNOSIS — I5032 Chronic diastolic (congestive) heart failure: Secondary | ICD-10-CM

## 2018-01-29 ENCOUNTER — Telehealth: Payer: Self-pay | Admitting: Family

## 2018-01-29 DIAGNOSIS — Z1211 Encounter for screening for malignant neoplasm of colon: Secondary | ICD-10-CM | POA: Diagnosis not present

## 2018-01-29 DIAGNOSIS — Z1212 Encounter for screening for malignant neoplasm of rectum: Secondary | ICD-10-CM | POA: Diagnosis not present

## 2018-01-29 NOTE — Telephone Encounter (Signed)
Called to inform patient that all instruction should be in the box on how to seen Cologuard collection back.  Patient verbalized understanding and states that he had already read instruction and figured it out.

## 2018-02-02 ENCOUNTER — Encounter: Payer: Self-pay | Admitting: Neurology

## 2018-02-04 LAB — COLOGUARD: COLOGUARD: NEGATIVE

## 2018-02-08 ENCOUNTER — Encounter: Payer: Self-pay | Admitting: Neurology

## 2018-02-08 ENCOUNTER — Ambulatory Visit (INDEPENDENT_AMBULATORY_CARE_PROVIDER_SITE_OTHER): Payer: Medicare Other | Admitting: Neurology

## 2018-02-08 VITALS — BP 130/70 | HR 116 | Ht 66.5 in | Wt 182.0 lb

## 2018-02-08 DIAGNOSIS — I5032 Chronic diastolic (congestive) heart failure: Secondary | ICD-10-CM

## 2018-02-08 DIAGNOSIS — E663 Overweight: Secondary | ICD-10-CM | POA: Diagnosis not present

## 2018-02-08 DIAGNOSIS — Z9989 Dependence on other enabling machines and devices: Secondary | ICD-10-CM

## 2018-02-08 DIAGNOSIS — Z85118 Personal history of other malignant neoplasm of bronchus and lung: Secondary | ICD-10-CM | POA: Diagnosis not present

## 2018-02-08 DIAGNOSIS — I2510 Atherosclerotic heart disease of native coronary artery without angina pectoris: Secondary | ICD-10-CM | POA: Diagnosis not present

## 2018-02-08 DIAGNOSIS — Z789 Other specified health status: Secondary | ICD-10-CM | POA: Diagnosis not present

## 2018-02-08 DIAGNOSIS — G4733 Obstructive sleep apnea (adult) (pediatric): Secondary | ICD-10-CM

## 2018-02-08 NOTE — Progress Notes (Signed)
Subjective:    Patient ID: John [name] is a 72 y.o. male.  HPI     Star Age, MD, PhD [facility] [address] [place], [place] [address]  Dear [name],   I saw your patient, John [name], upon your kind request in my sleep clinic today for initial consultation of his sleep disorder, in particular, reevaluation of his diagnosis of OSA. The patient is accompanied by his friend, [name] (GF of 18 years), today. As you know, John [name] is a 72 year old right-handed gentleman with an underlying medical history of lung cancer (SCLC with s/p chemo and radiation), hypertension, congestive heart failure, diabetes with associated neuropathy, and overweight state, who was previously diagnosed with obstructive sleep apnea and placed on CPAP therapy. Prior sleep study results are not available for my review today. He reports that sleep study testing was about 10 years ago. He has an older CPAP machine. I reviewed your office note from 01/12/2018. I reviewed his CPAP compliance data from 01/04/2018 through 02/02/2018 which is a total of 30 days, during which time he used his machine 29 days with percent used days greater than 4 hours at 56.7%, indicating suboptimal compliance with an average usage of 5 hours and 1 minute for days on treatment. Residual AHI 1.7 per hour, pressure of 13 cm with a ramp time of 10 minutes, starting at 5 cm. He has a C-Flex setting of 3. He has a Respironics REM star auto machine. His Epworth sleepiness score is 6 out of 24, fatigue score is 44 out of 63. He is a retired Administrator. He was followed by Dr. Ellin Goodie at [facility] neurology in [place] for his sleep apnea, last seen on 04/13/15. Chart review indicates that he was diagnosed with moderate to severe obstructive sleep apnea with an AHI of 17 per hour, REM AHI of 65 per hour, O2 nadir of 76%. He was documented to be compliant with his CPAP. His Epworth sleepiness score is 6 out  of 24 today, fatigue score is 44 out of 63. He would like to get updated supplies a new equipment. He is divorced, lives with his friend, has a total of 5 children. He smokes about  ppd, has reduced. He does not utilize alcohol currently and drinks caffeine in the form of coffee, about 2 cups per day, and soda about 1 per day.  The mask is ill fitting and leaks is very high. He has been using a FFM, DME was [facility]. He sleeps some in the recliner. He wonders if the pressure settings the right. He is not aware of any family history of OSA. He denies restless leg symptoms. Bedtime is around 1 AM, rise time before 8 typically. He does not have night to night nocturia and denies morning headaches. He takes a children's Benadryl on most nights of the week or maybe every other night on average. Sometimes he also takes Tylenol at night.  His Past Medical History Is Significant For: Past Medical History:  Diagnosis Date  . Adenocarcinoma of lung, right ([place]) 04/18/2016  . Anxiety   . Arthritis   . CHF (congestive heart failure) ([place])   . COPD (chronic obstructive pulmonary disease) ([place])   . Depression   . Diabetes mellitus without complication ([facility])    no meds  . DM (diabetes mellitus) ([place]) 07/09/2016  . Dyspnea   . History of kidney stones   . Hyperlipidemia   . Hypertension   . Macular degeneration   .  Neuropathy   . Non-small cell lung cancer, right ([place]) 04/18/2016  . Prostatitis   . Pulmonary nodule, left 07/16/2016  . Sleep apnea    cpap    His Past Surgical History Is Significant For: Past Surgical History:  Procedure Laterality Date  . CATARACT EXTRACTION, BILATERAL Bilateral   . NO PAST SURGERIES    . PORTACATH PLACEMENT Left 06/13/2016   Procedure: INSERTION PORT-A-CATH;  Surgeon: Aviva Signs, MD;  Location: [facility];  Service: General;  Laterality: Left;  Marland Kitchen VIDEO BRONCHOSCOPY WITH ENDOBRONCHIAL NAVIGATION N/A 05/28/2016   Procedure: VIDEO BRONCHOSCOPY WITH ENDOBRONCHIAL NAVIGATION;   Surgeon: [name], MD;  Location: [place];  Service: Thoracic;  Laterality: N/A;  . VIDEO BRONCHOSCOPY WITH ENDOBRONCHIAL ULTRASOUND N/A 05/28/2016   Procedure: VIDEO BRONCHOSCOPY WITH ENDOBRONCHIAL ULTRASOUND;  Surgeon: [name], MD;  Location: [place];  Service: Thoracic;  Laterality: N/A;    His Family History Is Significant For: Family History  Problem Relation Age of Onset  . Hypertension Mother   . Diabetes Father   . Heart disease Father   . Stroke Father   . Hypertension Sister     His Social History Is Significant For: Social History   Socioeconomic History  . Marital status: Divorced    Spouse name: Not on file  . Number of children: 5  . Years of education: 10  . Highest education level: 10th grade  Occupational History  . Occupation: Retired  Scientific laboratory technician  . Financial resource strain: Not very hard  . Food insecurity:    Worry: Never true    Inability: Never true  . Transportation needs:    Medical: No    Non-medical: No  Tobacco Use  . Smoking status: Current Every Day Smoker    Packs/day: 0.10    Years: 55.00    Pack years: 5.50    Types: Cigarettes    Start date: 03/11/1961  . Smokeless tobacco: Never Used  . Tobacco comment: smoking 1 to 2 cigarettes per day  Substance and Sexual Activity  . Alcohol use: No  . Drug use: No  . Sexual activity: Yes    Birth control/protection: None  Lifestyle  . Physical activity:    Days per week: 0 days    Minutes per session: 0 min  . Stress: To some extent  Relationships  . Social connections:    Talks on phone: More than three times a week    Gets together: More than three times a week    Attends religious service: Never    Active member of club or organization: No    Attends meetings of clubs or organizations: Never    Relationship status: Divorced  Other Topics Concern  . Not on file  Social History Narrative  . Not on file    His Allergies Are:  No Known Allergies:   His  Current Medications Are:  Outpatient Encounter Medications as of 02/08/2018  Medication Sig  . aspirin EC 81 MG tablet Take 81 mg by mouth daily.  . cloNIDine (CATAPRES) 0.1 MG tablet Take 1 tablet (0.1 mg total) by mouth 3 (three) times daily.  . fluticasone (FLONASE) 50 MCG/ACT nasal spray Place 2 sprays into both nostrils daily.  . furosemide (LASIX) 20 MG tablet Take 1 tablet (20 mg total) by mouth daily.  Marland Kitchen gabapentin (NEURONTIN) 300 MG capsule Take 1 capsule in Am and 2 at night  . INCRUSE ELLIPTA 62.5 MCG/INH AEPB TAKE 1 PUFF BY MOUTH EVERY DAY  .  levothyroxine (SYNTHROID, LEVOTHROID) 50 MCG tablet Take 1 tablet (50 mcg total) by mouth daily.  Marland Kitchen linagliptin (TRADJENTA) 5 MG TABS tablet Take 1 tablet (5 mg total) by mouth daily.  Marland Kitchen lisinopril-hydrochlorothiazide (PRINZIDE,ZESTORETIC) 20-25 MG tablet Take 1 tablet by mouth daily.  . meloxicam (MOBIC) 7.5 MG tablet TAKE 1 TABLET (7.5 MG TOTAL) BY MOUTH DAILY AS NEEDED FOR PAIN (BACK PAIN).  Marland Kitchen metFORMIN (GLUCOPHAGE XR) 500 MG 24 hr tablet Take 1 tablet (500 mg total) by mouth daily with breakfast.  . metoprolol tartrate (LOPRESSOR) 50 MG tablet Take 1 hour before your cardiac CT  . metoprolol tartrate (LOPRESSOR) 50 MG tablet In ADDITION to previous order from  today: Pt needs to take a total of 100 mg Lopressor 2 hrs prior to cardiac CT. (Updated dosing).Pt will be made aware  . ondansetron (ZOFRAN) 8 MG tablet Take 1 tablet (8 mg total) by mouth every 8 (eight) hours as needed for nausea or vomiting.  . potassium chloride (K-DUR) 10 MEQ tablet Take 1 tablet (10 mEq total) by mouth daily.  . pravastatin (PRAVACHOL) 40 MG tablet Take 1 tablet (40 mg total) by mouth every 3 (three) days. M-W-F  . prochlorperazine (COMPAZINE) 10 MG tablet Take 1 tablet (10 mg total) by mouth every 6 (six) hours as needed for nausea or vomiting.  . tamsulosin (FLOMAX) 0.4 MG CAPS capsule TAKE 2 CAPSULES (0.8 MG TOTAL) BY MOUTH DAILY.  . traZODone (DESYREL) 50 MG  tablet TAKE 1 TABLET (50 MG TOTAL) BY MOUTH AT BEDTIME AS NEEDED FOR SLEEP.  . VENTOLIN HFA 108 (90 Base) MCG/ACT inhaler Please specify directions, refills and quantity  . [DISCONTINUED] empagliflozin (JARDIANCE) 10 MG TABS tablet Take 10 mg by mouth daily.  . [DISCONTINUED] LORazepam (ATIVAN) 0.5 MG tablet TAKE 1 TABLET BY MOUTH TWICE DAILY AS NEEDED   No facility-administered encounter medications on file as of 02/08/2018.   :  Review of Systems:  Out of a complete 14 point review of systems, all are reviewed and negative with the exception of these symptoms as listed below: Review of Systems  Neurological:       Pt presents today to discuss his cpap. Pt's current cpap is about 72 years old. Pt does not have a DME. Pt needs new cpap supplies. Pt has not had a sleep study in about 10 years.  Epworth Sleepiness Scale 0= would never doze 1= slight chance of dozing 2= moderate chance of dozing 3= high chance of dozing  Sitting and reading: 1 Watching TV: 2 Sitting inactive in a public place (ex. Theater or meeting): 0 As a passenger in a car for an hour without a break: 0 Lying down to rest in the afternoon: 3 Sitting and talking to someone: 0 Sitting quietly after lunch (no alcohol): 0 In a car, while stopped in traffic: 0 Total: 6     Objective:  Neurological Exam  Physical Exam Physical Examination:   Vitals:   02/08/18 1327  BP: 130/70  Pulse: (!) 116   General Examination: The patient is a very pleasant 72 y.o. male in no acute distress. He appears well-developed and well-nourished and well groomed.   HEENT: Normocephalic, atraumatic, pupils are equal, round and reactive to light and accommodation. Extraocular tracking is good without limitation to gaze excursion or nystagmus noted. Normal smooth pursuit is noted. Hearing is grossly intact. Face is symmetric with normal facial animation and normal facial sensation. Speech is clear with no dysarthria noted. There is no  hypophonia. There is no lip, neck/head, jaw or voice tremor. Neck is supple with full range of passive and active motion. There are no carotid bruits on auscultation. Oropharynx exam reveals: mild mouth dryness, adequate dental hygiene and moderate airway crowding, due to smaller airway entry and thicker soft palate. Tonsils are about 1-2+ bilaterally. Mallampati is class III. Neck circumference is 18 1/8 inches. Tongue protrudes centrally and palate elevates symmetrically.   Chest: Clear to auscultation without wheezing, rhonchi or crackles noted.  Heart: S1+S2+0, regular and normal without murmurs, rubs or gallops noted.   Abdomen: Soft, non-tender and non-distended with normal bowel sounds appreciated on auscultation.  Extremities: There is no pitting edema in the distal lower extremities bilaterally.   Skin: Warm and dry without trophic changes noted.  Musculoskeletal: exam reveals no obvious joint deformities, tenderness or joint swelling or erythema. Arthritic changes in both hands noted.  Neurologically:  Mental status: The patient is awake, alert and oriented in all 4 spheres. His immediate and remote memory, attention, language skills and fund of knowledge are appropriate. There is no evidence of aphasia, agnosia, apraxia or anomia. Speech is clear with normal prosody and enunciation. Thought process is linear. Mood is normal and affect is normal.  Cranial nerves II - XII are as described above under HEENT exam. In addition: shoulder shrug is normal with equal shoulder height noted. Motor exam: Normal bulk, strength and tone is noted. There is no drift, tremor or rebound. Fine motor skills and coordination: intact with normal finger taps, normal hand movements, normal rapid alternating patting, normal foot taps and normal foot agility.  Cerebellar testing: No dysmetria or intention tremor on finger to nose testing. Heel to shin is unremarkable bilaterally. There is no truncal or gait  ataxia.  Sensory exam: intact to light touch in the upper and lower extremities.  Gait, station and balance: He stands easily. No veering to one side is noted. No leaning to one side is noted. Posture is age-appropriate and stance is narrow based. Gait shows normal stride length and normal pace. No problems turning are noted.               Assessment and Plan:  In summary, John [name] is a very pleasant 72 y.o.-year old male with an underlying medical history of lung cancer (SCLC with s/p chemo and radiation), hypertension, congestive heart failure, diabetes with associated neuropathy, and overweight state, who presents for reevaluation of his prior diagnosis of obstructive sleep apnea. He has been on CPAP but has not been fully compliant with treatment, he has had difficulty with the pressure setting as well as high leak, using older equipment and an older machine. He is advised to proceed with a repeat sleep study to update his diagnosis and hopefully establish treatment on new equipment. We will also try to find him a better fitting fullface mask. He has a history of mouth opening at night and tried a nasal mask in the past which resulted in high leak through the mouth. I had a long chat with the patient and [name] about my findings and the diagnosis of OSA, its prognosis and treatment options. We talked about medical treatments, surgical interventions and non-pharmacological approaches. I explained in particular the risks and ramifications of untreated moderate to severe OSA, especially with respect to developing cardiovascular disease down the Road, including congestive heart failure, difficult to treat hypertension, cardiac arrhythmias, or stroke. Even type 2 diabetes has, in part, been linked to untreated OSA. Symptoms of  untreated OSA include daytime sleepiness, memory problems, mood irritability and mood disorder such as depression and anxiety, lack of energy, as well as recurrent headaches,  especially morning headaches. We talked about smoking cessation and trying to maintain a healthy lifestyle in general, as well as the importance of weight control. I encouraged the patient to eat healthy, exercise daily and keep well hydrated, to keep a scheduled bedtime and wake time routine, to not skip any meals and eat healthy snacks in between meals. I advised the patient not to drive when feeling sleepy. I recommended the following at this time: sleep study with potential positive airway pressure titration. (We will score hypopneas at 4%).   I explained the sleep test procedure to the patient and also outlined possible surgical and non-surgical treatment options of OSA, including the use of a custom-made dental device (which would require a referral to a specialist dentist or oral surgeon), upper airway surgical options, such as pillar implants, radiofrequency surgery, tongue base surgery, and UPPP (which would involve a referral to an ENT surgeon). Rarely, jaw surgery such as mandibular advancement may be considered.  I also explained the CPAP treatment option to the patient, who indicated that he would be willing to continue with PAP if the need arises. I explained the importance of being compliant with PAP treatment, not only for insurance purposes but primarily to improve His symptoms, and for the patient's long term health benefit, including to reduce His cardiovascular risks. I answered all their questions today and the patient and his GF were in agreement. I would like to see him back after the sleep study is completed and encouraged him to call with any interim questions, concerns, problems or updates.   Thank you very much for allowing me to participate in the care of this nice patient. If I can be of any further assistance to you please do not hesitate to call me at [phone].  Sincerely,   Star Age, MD, PhD

## 2018-02-08 NOTE — Patient Instructions (Signed)
Thank you for choosing [facility] for your sleep related care! It was nice to meet you today! I appreciate that you entrust me with your sleep related healthcare concerns. I hope, I was able to address at least some of your concerns today, and that I can help you feel reassured and also get better.    Here is what we discussed today and what we came up with as our plan for you:    Based on your symptoms and your exam I believe you are still at risk for obstructive sleep apnea and would benefit from reevaluation as it has been many years and you need new supplies and updated machine. Therefore, I think we should proceed with a sleep study to determine how severe your sleep apnea is. If you have more than mild OSA, I want you to consider ongoing treatment with CPAP. Please remember, the risks and ramifications of moderate to severe obstructive sleep apnea or OSA are: Cardiovascular disease, including congestive heart failure, stroke, difficult to control hypertension, arrhythmias, and even type 2 diabetes has been linked to untreated OSA. Sleep apnea causes disruption of sleep and sleep deprivation in most cases, which, in turn, can cause recurrent headaches, problems with memory, mood, concentration, focus, and vigilance. Most people with untreated sleep apnea report excessive daytime sleepiness, which can affect their ability to drive. Please do not drive if you feel sleepy.   I will likely see you back after your sleep study to go over the test results and where to go from there. We will call you after your sleep study to advise about the results (most likely, you will hear from Kristen, my nurse) and to set up an appointment at the time, as necessary.    Our sleep lab administrative assistant will call you to schedule your sleep study. If you don't hear back from her by about 2 weeks from now, please feel free to call her at [phone]. You can leave a message with your phone number  and concerns, if you get the voicemail box. She will call back as soon as possible.     

## 2018-02-09 ENCOUNTER — Encounter (HOSPITAL_COMMUNITY): Payer: Self-pay

## 2018-02-09 ENCOUNTER — Ambulatory Visit (HOSPITAL_COMMUNITY)
Admission: RE | Admit: 2018-02-09 | Discharge: 2018-02-09 | Disposition: A | Discharge status: Home / Self Care | Payer: Medicare Other | Source: Ambulatory Visit | Attending: Cardiovascular Disease | Admitting: Cardiovascular Disease

## 2018-02-09 DIAGNOSIS — I5032 Chronic diastolic (congestive) heart failure: Secondary | ICD-10-CM | POA: Insufficient documentation

## 2018-02-09 MED ORDER — METOPROLOL TARTRATE 5 MG/5ML IV SOLN
5.0000 mg | INTRAVENOUS | Status: DC | PRN
  Administered 2018-02-09: 5 mg via INTRAVENOUS
  Filled 2018-02-09: qty 5

## 2018-02-10 ENCOUNTER — Telehealth: Payer: Self-pay

## 2018-02-10 DIAGNOSIS — I5032 Chronic diastolic (congestive) heart failure: Secondary | ICD-10-CM

## 2018-02-10 MED ORDER — LISINOPRIL-HYDROCHLOROTHIAZIDE 20-25 MG PO TABS: 1.0000 | ORAL_TABLET | Freq: Every day | ORAL | 3 refills | Status: DC

## 2018-02-10 NOTE — Telephone Encounter (Signed)
-----   Message from Herminio Commons, MD sent at 02/10/2018  3:27 PM EST ----- Please hold Prinzide and reschedule coronary CT angiogram.  ----- Message ----- From: Fredric Dine, RN Sent: 02/10/2018   2:13 PM EST To: Herminio Commons, MD  That would be up to you or Dr. Aundra Dubin.  Thank you , Eleonore Chiquito RN ----- Message ----- From: Herminio Commons, MD Sent: 02/09/2018   2:46 PM EST To: Fredric Dine, RN  Can we hold Prinzide for several days and try to reschedule?  ----- Message ----- From: Fredric Dine, RN Sent: 02/09/2018   2:10 PM EST To: Herminio Commons, MD  Pt came in for CT heart today.  We were unable to optimize his heart rate for the exam, because of hypotension.  The test was canceled.

## 2018-02-10 NOTE — Telephone Encounter (Signed)
I spoke with Ivin Booty, Cardiac CT scheduler, pt has new CT apt for 11/19 at 12 pm in radiology apt.All the instructions still apply for test.Patient is to [place] starting today and until after repeat cardiac CT is done.I left message with info on new date and asked pt to call back

## 2018-02-11 MED ORDER — METOPROLOL TARTRATE 100 MG PO TABS: ORAL_TABLET | ORAL | 0 refills | Status: DC

## 2018-02-11 NOTE — Telephone Encounter (Signed)
Pt called back, will HOLD prinivil, I e-scribed lopressor 100 mg again to pharmacy.He will follow same instructions for CT.He will resume prinivil after CT and monitor BP

## 2018-02-23 ENCOUNTER — Telehealth: Payer: Self-pay | Admitting: Cardiovascular Disease

## 2018-02-23 ENCOUNTER — Ambulatory Visit (HOSPITAL_COMMUNITY)
Admission: RE | Admit: 2018-02-23 | Discharge: 2018-02-23 | Disposition: A | Discharge status: Home / Self Care | Payer: Medicare Other | Source: Ambulatory Visit | Attending: Cardiovascular Disease | Admitting: Cardiovascular Disease

## 2018-02-23 ENCOUNTER — Encounter (HOSPITAL_COMMUNITY): Payer: Self-pay

## 2018-02-23 ENCOUNTER — Ambulatory Visit (HOSPITAL_COMMUNITY): Payer: Medicare Other

## 2018-02-23 DIAGNOSIS — I5032 Chronic diastolic (congestive) heart failure: Secondary | ICD-10-CM | POA: Insufficient documentation

## 2018-02-23 DIAGNOSIS — Z538 Procedure and treatment not carried out for other reasons: Secondary | ICD-10-CM | POA: Insufficient documentation

## 2018-02-23 MED ORDER — METOPROLOL TARTRATE 5 MG/5ML IV SOLN
5.0000 mg | INTRAVENOUS | Status: DC | PRN
  Administered 2018-02-23 (×4): 5 mg via INTRAVENOUS
  Filled 2018-02-23 (×5): qty 5

## 2018-02-23 MED ORDER — NITROGLYCERIN 0.4 MG SL SUBL
0.8000 mg | SUBLINGUAL_TABLET | SUBLINGUAL | Status: DC | PRN
  Filled 2018-02-23: qty 25

## 2018-02-23 MED ORDER — NITROGLYCERIN 0.4 MG SL SUBL
SUBLINGUAL_TABLET | SUBLINGUAL | Status: AC
  Filled 2018-02-23: qty 2

## 2018-02-23 MED ORDER — METOPROLOL TARTRATE 5 MG/5ML IV SOLN
INTRAVENOUS | Status: AC
  Filled 2018-02-23: qty 20

## 2018-02-23 NOTE — Telephone Encounter (Signed)
Spoke with pt and wife who report that after taking 100 mg of lopressor 2 hours prior to the test. They were unable to achieve the appropriate HR and BP to do the cardiac CT. Pt was told to call office and ask to have myoview done. Please advise.

## 2018-02-23 NOTE — Telephone Encounter (Signed)
Please call patient in regards to not being able to do Cardiac CT and Dr.McLean recommending patient have myoview. / tg

## 2018-02-26 ENCOUNTER — Ambulatory Visit (INDEPENDENT_AMBULATORY_CARE_PROVIDER_SITE_OTHER): Payer: Medicare Other | Admitting: Neurology

## 2018-02-26 DIAGNOSIS — Z9989 Dependence on other enabling machines and devices: Secondary | ICD-10-CM

## 2018-02-26 DIAGNOSIS — G4733 Obstructive sleep apnea (adult) (pediatric): Secondary | ICD-10-CM | POA: Diagnosis not present

## 2018-02-26 DIAGNOSIS — G4720 Circadian rhythm sleep disorder, unspecified type: Secondary | ICD-10-CM

## 2018-02-26 DIAGNOSIS — G4761 Periodic limb movement disorder: Secondary | ICD-10-CM

## 2018-02-26 DIAGNOSIS — Z85118 Personal history of other malignant neoplasm of bronchus and lung: Secondary | ICD-10-CM

## 2018-02-26 DIAGNOSIS — Z789 Other specified health status: Secondary | ICD-10-CM

## 2018-02-26 DIAGNOSIS — I5032 Chronic diastolic (congestive) heart failure: Secondary | ICD-10-CM

## 2018-02-26 DIAGNOSIS — E663 Overweight: Secondary | ICD-10-CM

## 2018-03-01 ENCOUNTER — Telehealth: Payer: Self-pay

## 2018-03-01 NOTE — Telephone Encounter (Signed)
Thank you :)

## 2018-03-01 NOTE — Telephone Encounter (Signed)
Pt needs lexiscan, could not get HR low enough for cardiac CT, lmtcb, order placed, no meds to hold per dr Harl Bowie

## 2018-03-01 NOTE — Telephone Encounter (Signed)
Pt notified, lexiscan ordered, instructions given

## 2018-03-01 NOTE — Telephone Encounter (Signed)
Dr Harl Bowie ordered lexiscan, no meds to hold.I placed order, lm for pt to call [facility]

## 2018-03-01 NOTE — Addendum Note (Signed)
Addended by: Barbarann Ehlers A on: 03/01/2018 08:49 AM   Modules accepted: Orders

## 2018-03-01 NOTE — Telephone Encounter (Signed)
Pt called back, instructions given, apt made for lexiscan

## 2018-03-01 NOTE — Telephone Encounter (Signed)
-----   Message from Arnoldo Lenis, MD sent at 03/01/2018  7:45 AM EST ----- Regarding: RE: HR too high for CT Ok to order exercise myoview if he is able to run on treadmill, if not then lexiscan Does not need to hold any meds   Zandra Abts MD ----- Message ----- From: Bernita Raisin, RN Sent: 02/25/2018   2:01 PM EST To: Arnoldo Lenis, MD Subject: HR too high for CT                             Told to get [facility]

## 2018-03-08 ENCOUNTER — Inpatient Hospital Stay (HOSPITAL_COMMUNITY): Payer: Medicare Other | Attending: Hematology

## 2018-03-08 ENCOUNTER — Telehealth: Payer: Self-pay

## 2018-03-08 DIAGNOSIS — R5383 Other fatigue: Secondary | ICD-10-CM | POA: Diagnosis not present

## 2018-03-08 DIAGNOSIS — E039 Hypothyroidism, unspecified: Secondary | ICD-10-CM | POA: Insufficient documentation

## 2018-03-08 DIAGNOSIS — Z85118 Personal history of other malignant neoplasm of bronchus and lung: Secondary | ICD-10-CM | POA: Insufficient documentation

## 2018-03-08 DIAGNOSIS — C3491 Malignant neoplasm of unspecified part of right bronchus or lung: Secondary | ICD-10-CM

## 2018-03-08 LAB — CBC WITH DIFFERENTIAL/PLATELET
Abs Immature Granulocytes: 0.01 10*3/uL (ref 0.00–0.07)
BASOS ABS: 0 10*3/uL (ref 0.0–0.1)
Basophils Relative: 1 %
EOS ABS: 0.2 10*3/uL (ref 0.0–0.5)
EOS PCT: 2 %
HEMATOCRIT: 42 % (ref 39.0–52.0)
Hemoglobin: 12.9 g/dL — ABNORMAL LOW (ref 13.0–17.0)
IMMATURE GRANULOCYTES: 0 %
LYMPHS ABS: 0.7 10*3/uL (ref 0.7–4.0)
Lymphocytes Relative: 9 %
MCH: 29.9 pg (ref 26.0–34.0)
MCHC: 30.7 g/dL (ref 30.0–36.0)
MCV: 97.2 fL (ref 80.0–100.0)
Monocytes Absolute: 0.8 10*3/uL (ref 0.1–1.0)
Monocytes Relative: 11 %
NRBC: 0 % (ref 0.0–0.2)
Neutro Abs: 5.5 10*3/uL (ref 1.7–7.7)
Neutrophils Relative %: 77 %
PLATELETS: 318 10*3/uL (ref 150–400)
RBC: 4.32 MIL/uL (ref 4.22–5.81)
RDW: 16.1 % — AB (ref 11.5–15.5)
WBC: 7.2 10*3/uL (ref 4.0–10.5)

## 2018-03-08 LAB — COMPREHENSIVE METABOLIC PANEL
ALBUMIN: 3.6 g/dL (ref 3.5–5.0)
ALT: 12 U/L (ref 0–44)
ANION GAP: 7 (ref 5–15)
AST: 14 U/L — AB (ref 15–41)
Alkaline Phosphatase: 72 U/L (ref 38–126)
BUN: 19 mg/dL (ref 8–23)
CHLORIDE: 100 mmol/L (ref 98–111)
CO2: 29 mmol/L (ref 22–32)
Calcium: 8.7 mg/dL — ABNORMAL LOW (ref 8.9–10.3)
Creatinine, Ser: 0.98 mg/dL (ref 0.61–1.24)
GFR calc Af Amer: >60 mL/min (ref >60)
GFR calc non Af Amer: >60 mL/min (ref >60)
GLUCOSE: 176 mg/dL — AB (ref 70–99)
POTASSIUM: 3.9 mmol/L (ref 3.5–5.1)
SODIUM: 136 mmol/L (ref 135–145)
Total Bilirubin: 0.6 mg/dL (ref 0.3–1.2)
Total Protein: 7.4 g/dL (ref 6.5–8.1)

## 2018-03-08 LAB — MAGNESIUM: Magnesium: 2.2 mg/dL (ref 1.7–2.4)

## 2018-03-08 LAB — TSH: TSH: 8.396 u[IU]/mL — ABNORMAL HIGH (ref 0.350–4.500)

## 2018-03-08 LAB — LACTATE DEHYDROGENASE: LDH: 126 U/L (ref 98–192)

## 2018-03-08 NOTE — Procedures (Signed)
PATIENT'S NAME:  John [name], John [name] DOB:      1945/06/30      MR#:    [id number]     DATE OF RECORDING: 02/26/2018 REFERRING M.D.:  [name] FNP Study Performed:   Baseline Polysomnogram HISTORY: 72 year old man with a history of lung cancer (SCLC with s/p chemo and radiation), hypertension, congestive heart failure, diabetes with associated neuropathy, and overweight state, who was previously diagnosed with obstructive sleep apnea and placed on CPAP therapy. He has an older CPAP machine. The patient endorsed the Epworth Sleepiness Scale at 6 points. The patient's weight 185 pounds with a height of 66 (inches), resulting in a BMI of 29.4 kg/m2. The patient's neck circumference measured 18 inches.  CURRENT MEDICATIONS: CPAP, Aspirin, Catapres, Flonase, Lasix, Neurontin, Incruse Ellipta, Synthroid, Tradjenta, Prinzide, Mobic, Glucophage, Lopressor, Zofran, K-Dur, Pravachol, Compazine, Flomax, Desyrel, Ventolin HFA   PROCEDURE:  This is a multichannel digital polysomnogram utilizing the Somnostar 11.2 system.  Electrodes and sensors were applied and monitored per AASM Specifications.   EEG, EOG, Chin and Limb EMG, were sampled at 200 Hz.  ECG, Snore and Nasal Pressure, Thermal Airflow, Respiratory Effort, CPAP Flow and Pressure, Oximetry was sampled at 50 Hz. Digital video and audio were recorded.      BASELINE STUDY Lights Out was at 21:06 and Lights On at 04:50. Total recording time (TRT) was 465 minutes, with a total sleep time (TST) of 374 minutes. The patient's sleep latency was 44.5 minutes, which is delayed. REM sleep was absent. The sleep efficiency was 80.4 %.      SLEEP ARCHITECTURE: WASO (Wake after sleep onset)  was 46 minutes.  There were 20.5 minutes in Stage N1, 326 minutes Stage N2, 27.5 minutes Stage N3 and 0 minutes in Stage REM.  The percentage of Stage N1 was 5.5%, Stage N2 was 87.2%, Stage N3 was 7.4% and Stage R (REM sleep) was absent. The arousals were noted as: 107 were  spontaneous, 93 were associated with PLMs, 37 were associated with respiratory events.  RESPIRATORY ANALYSIS:  There was a total of 37 respiratory events: 18 obstructive apneas, 0 central apneas and 2 mixed apneas with a total of 20 apneas and an apnea index (AI) of 3.2 /hour. There were 17 hypopneas with a hypopnea index of 2.7/hour. The patient also had 11 respiratory event related arousals (RERAs).      The total APNEA/HYPOPNEA INDEX (AHI) was 5.9 /hour and the total RESPIRATORY DISTURBANCE INDEX was 7.7 /hour  0 events occurred in REM sleep and 37 events in NREM. The REM AHI was 0 /hour versus a non-REM AHI of 5.9 /hour.  The patient spent 256.5 minutes of total sleep time in the supine position and 118 minutes in non-supine. The supine AHI was 8.4, versus a non-supine AHI of 0.5.  OXYGEN SATURATION & C02:  The baseline 02 saturation was 95%, with the lowest being 82%. Time spent below 89% saturation equaled 70 minutes.  PERIODIC LIMB MOVEMENTS:  The patient had a total of 254 Periodic Limb Movements. The Periodic Limb Movement (PLM) index was 40.7 and the PLM Arousal index was 14.9 /hour.   Audio and video analysis did not show any abnormal or unusual movements, behaviors, phonations or vocalizations. The patient took 2 bathroom breaks. Snoring was noted, ranging from mild to loud. The EKG was in keeping with normal sinus rhythm (NSR).  Post-study, the patient indicated that sleep was worse than usual.   IMPRESSION: 1. Obstructive Sleep Apnea (OSA) 2. Periodic Limb  Movement Disorder (PLMD) 3. Dysfunctions associated with sleep stages or arousal from sleep  RECOMMENDATIONS: 1. This study demonstrates overall mild obstructive sleep apnea. The absence of REM sleep likely underestimates the AHI and O2 nadir. Given the patient's medical history and sleep related complaints, ongoing treatment with positive airway pressure is recommended; this can be achieved in the form of autoPAP. Alternatively,  a full-night CPAP titration study would allow optimization of therapy, if needed. Other treatment options may include avoidance of supine sleep position along with weight loss, upper airway or jaw surgery in selected patients or the use of an oral appliance in certain patients. ENT evaluation and/or consultation with a maxillofacial surgeon or dentist may be feasible in some instances. Please note that untreated obstructive sleep apnea may carry additional perioperative morbidity. Patients with significant obstructive sleep apnea should receive perioperative PAP therapy and the surgeons and particularly the anesthesiologist should be informed of the diagnosis and the severity of the sleep disordered breathing. 2. Moderate PLMs (periodic limb movements of sleep) were noted during this study with mild arousals; clinical correlation is recommended. PLMD may improve with OSA treatment.  3. This study shows sleep fragmentation and abnormal sleep stage percentages; these are nonspecific findings and per se do not signify an intrinsic sleep disorder or a cause for the patient's sleep-related symptoms. Causes include (but are not limited to) the first night effect of the sleep study, circadian rhythm disturbances, medication effect or an underlying mood disorder or medical problem.  4. The patient should be cautioned not to drive, work at heights, or operate dangerous or heavy equipment when tired or sleepy. Review and reiteration of good sleep hygiene measures should be pursued with any patient. 5. The patient will be seen in follow-up in the sleep clinic at [facility] for discussion of the test results, symptom and treatment compliance review, further management strategies, etc. The referring provider will be notified of the test results.  I certify that I have reviewed the entire raw data recording prior to the issuance of this report in accordance with the Standards of Accreditation of the American Academy of Sleep  Medicine (AASM)   Star Age, MD, PhD Diplomat, American Board of Neurology and Sleep Medicine (Neurology and Sleep Medicine)

## 2018-03-08 NOTE — Progress Notes (Signed)
cap 

## 2018-03-08 NOTE — Addendum Note (Signed)
Addended by: Star Age on: 03/08/2018 08:32 AM   Modules accepted: Orders

## 2018-03-08 NOTE — Telephone Encounter (Signed)
-----   Message from Star Age, MD sent at 03/08/2018  8:32 AM EST ----- Patient referred by Evelina Dun, NP, seen by me on 02/08/18, diagnostic PSG on 02/26/18.    Please call and notify the patient that the recent sleep study did confirm the diagnosis of obstructive sleep apnea. OSA is overall mild, but worth treating to see if he feels better after treatment. To that end I recommend treatment for this in the form of autoPAP, which means, that we don't have to bring him back for a second sleep study with CPAP, but will let him try an autoPAP machine at home, through a DME company (of his choice, or as per insurance requirement). The DME representative will educate him on how to use the machine, how to put the mask on, etc. I have placed an order in the chart. Please send referral, talk to patient, send report to referring MD. We will need a FU in sleep clinic for 10 weeks post-PAP set up, please arrange that with me or one of our NPs. I would like to order an ONO 1 week post autoPAP set up too. FYI: He has an older CPAP and may have an existing DME.  Thanks,   Star Age, MD, PhD [facility])

## 2018-03-08 NOTE — Telephone Encounter (Signed)
I called pt. I advised pt that Dr. Rexene Alberts reviewed their sleep study results and found that pt does have mild osa. Dr. Rexene Alberts recommends that pt start an auto pap at home. I reviewed PAP compliance expectations with the pt. Pt is agreeable to starting an auto-PAP. I advised pt that an order will be sent to a DME, [facility], and [facility] will call the pt within about one week after they file with the pt's insurance. [facility] will show the pt how to use the machine, fit for masks, and troubleshoot the auto-PAP if needed. A follow up appt was made for insurance purposes with Hoyle Sauer, NP on 05/31/18 at 10:45am. Pt verbalized understanding to arrive 15 minutes early and bring their auto-PAP. A letter with all of this information in it will be mailed to the pt as a reminder. I verified with the pt that the address we have on file is correct. Pt verbalized understanding of results. Pt had no questions at this time but was encouraged to call back if questions arise. I have sent the orders for new auto pap and ONO on auto pap to [facility] and have received confirmation that they have received the order.

## 2018-03-08 NOTE — Progress Notes (Signed)
Patient referred by Evelina Dun, NP, seen by me on 02/08/18, diagnostic PSG on 02/26/18.    Please call and notify the patient that the recent sleep study did confirm the diagnosis of obstructive sleep apnea. OSA is overall mild, but worth treating to see if he feels better after treatment. To that end I recommend treatment for this in the form of autoPAP, which means, that we don't have to bring him back for a second sleep study with CPAP, but will let him try an autoPAP machine at home, through a DME company (of his choice, or as per insurance requirement). The DME representative will educate him on how to use the machine, how to put the mask on, etc. I have placed an order in the chart. Please send referral, talk to patient, send report to referring MD. We will need a FU in sleep clinic for 10 weeks post-PAP set up, please arrange that with me or one of our NPs. I would like to order an ONO 1 week post autoPAP set up too. FYI: He has an older CPAP and may have an existing DME.  Thanks,   Star Age, MD, PhD [facility])

## 2018-03-10 ENCOUNTER — Other Ambulatory Visit (HOSPITAL_COMMUNITY): Payer: Self-pay | Admitting: Nurse Practitioner

## 2018-03-12 ENCOUNTER — Encounter (HOSPITAL_COMMUNITY)
Admission: RE | Admit: 2018-03-12 | Discharge: 2018-03-12 | Disposition: A | Discharge status: Home / Self Care | Payer: Medicare Other | Source: Ambulatory Visit | Attending: Cardiology | Admitting: Cardiology

## 2018-03-12 ENCOUNTER — Encounter (HOSPITAL_COMMUNITY): Payer: Self-pay

## 2018-03-12 ENCOUNTER — Encounter (HOSPITAL_BASED_OUTPATIENT_CLINIC_OR_DEPARTMENT_OTHER)
Admission: RE | Admit: 2018-03-12 | Discharge: 2018-03-12 | Disposition: A | Discharge status: Home / Self Care | Payer: Medicare Other | Source: Ambulatory Visit | Attending: Cardiology | Admitting: Cardiology

## 2018-03-12 DIAGNOSIS — I5032 Chronic diastolic (congestive) heart failure: Secondary | ICD-10-CM | POA: Insufficient documentation

## 2018-03-12 HISTORY — DX: Unspecified asthma, uncomplicated: J45.909

## 2018-03-12 LAB — NM MYOCAR MULTI W/SPECT W/WALL MOTION / EF
CHL CUP NUCLEAR SRS: 9
CHL CUP NUCLEAR SSS: 10
CSEPPHR: 116 {beats}/min
LV dias vol: 68 mL (ref 62–150)
LV sys vol: 36 mL
RATE: 0.32
Rest HR: 90 {beats}/min
SDS: 1
TID: 1.11

## 2018-03-12 MED ORDER — REGADENOSON 0.4 MG/5ML IV SOLN
INTRAVENOUS | Status: AC
  Administered 2018-03-12: 0.4 mg via INTRAVENOUS
  Filled 2018-03-12: qty 5

## 2018-03-12 MED ORDER — TECHNETIUM TC 99M TETROFOSMIN IV KIT
10.0000 | PACK | Freq: Once | INTRAVENOUS | Status: AC | PRN
  Administered 2018-03-12: 11 via INTRAVENOUS

## 2018-03-12 MED ORDER — SODIUM CHLORIDE 0.9% FLUSH
INTRAVENOUS | Status: AC
  Administered 2018-03-12: 10 mL via INTRAVENOUS
  Filled 2018-03-12: qty 10

## 2018-03-12 MED ORDER — TECHNETIUM TC 99M TETROFOSMIN IV KIT
30.0000 | PACK | Freq: Once | INTRAVENOUS | Status: AC | PRN
  Administered 2018-03-12: 31 via INTRAVENOUS

## 2018-03-15 ENCOUNTER — Telehealth: Payer: Self-pay | Admitting: *Deleted

## 2018-03-15 ENCOUNTER — Ambulatory Visit: Payer: Medicare Other | Admitting: Family

## 2018-03-15 ENCOUNTER — Ambulatory Visit (HOSPITAL_COMMUNITY)
Admission: RE | Admit: 2018-03-15 | Discharge: 2018-03-15 | Disposition: A | Discharge status: Home / Self Care | Payer: Medicare Other | Source: Ambulatory Visit | Attending: Nurse Practitioner | Admitting: Nurse Practitioner

## 2018-03-15 DIAGNOSIS — R918 Other nonspecific abnormal finding of lung field: Secondary | ICD-10-CM | POA: Diagnosis not present

## 2018-03-15 DIAGNOSIS — C3491 Malignant neoplasm of unspecified part of right bronchus or lung: Secondary | ICD-10-CM | POA: Insufficient documentation

## 2018-03-15 DIAGNOSIS — K8020 Calculus of gallbladder without cholecystitis without obstruction: Secondary | ICD-10-CM | POA: Diagnosis not present

## 2018-03-15 DIAGNOSIS — K760 Fatty (change of) liver, not elsewhere classified: Secondary | ICD-10-CM | POA: Diagnosis not present

## 2018-03-15 DIAGNOSIS — C3490 Malignant neoplasm of unspecified part of unspecified bronchus or lung: Secondary | ICD-10-CM | POA: Diagnosis not present

## 2018-03-15 MED ORDER — IOPAMIDOL (ISOVUE-300) INJECTION 61%
100.0000 mL | Freq: Once | INTRAVENOUS | Status: AC | PRN
  Administered 2018-03-15: 100 mL via INTRAVENOUS

## 2018-03-15 NOTE — Telephone Encounter (Signed)
Patient informed. Copy sent to PCP °

## 2018-03-15 NOTE — Telephone Encounter (Signed)
-----   Message from [place] sent at 03/15/2018  1:22 PM EST -----   ----- Message ----- From: Herminio Commons, MD Sent: 03/15/2018   1:22 PM EST To: Massie Maroon, CMA  No significant blockages.  Symptoms likely due to COPD based on this study.

## 2018-03-18 ENCOUNTER — Encounter (HOSPITAL_COMMUNITY): Payer: Self-pay | Admitting: Hematology

## 2018-03-18 ENCOUNTER — Inpatient Hospital Stay (HOSPITAL_BASED_OUTPATIENT_CLINIC_OR_DEPARTMENT_OTHER): Payer: Medicare Other | Admitting: Hematology

## 2018-03-18 ENCOUNTER — Other Ambulatory Visit: Payer: Self-pay

## 2018-03-18 VITALS — BP 131/62 | HR 102 | Temp 98.0°F | Resp 18 | Wt 184.5 lb

## 2018-03-18 DIAGNOSIS — Z85118 Personal history of other malignant neoplasm of bronchus and lung: Secondary | ICD-10-CM | POA: Diagnosis not present

## 2018-03-18 DIAGNOSIS — R5383 Other fatigue: Secondary | ICD-10-CM | POA: Diagnosis not present

## 2018-03-18 DIAGNOSIS — C3491 Malignant neoplasm of unspecified part of right bronchus or lung: Secondary | ICD-10-CM

## 2018-03-18 DIAGNOSIS — E039 Hypothyroidism, unspecified: Secondary | ICD-10-CM

## 2018-03-18 NOTE — Assessment & Plan Note (Signed)
1.Stage IIIA ([id number]) NSCLC, favoring adenocarcinoma: Scan at diagnosis showed 2 right upper lobe pulmonary nodules measuring 1.7 and 2.0 cm, ipsilateral mediastinal adenopathy, status post bronchoscopy on 05/28/2016, left upper lobe pulmonary nodule not biopsy proven but suspicious with imaging.  He finished chemoradiation therapy with weekly carboplatin and paclitaxel on 08/20/2016.  Consolidation Durvalumab was started on 09/26/2016. -CT scan on 07/28/2017 shows stable disease.  He has completed 24 cycles of durvalumab on 09/25/2017. -CT scan of the chest with contrast dated 10/28/2017 showed stable post therapy change in the right upper lobe with an 8 mm nodule unchanged from April.  Left upper lobe showed 9 mm x 7 mm nodule which is also unchanged.  No evidence of disease progression.   -We discussed the results of the CT chest dated 03/15/2018 shows previously noted pulmonary nodules appear stable.  Chronic postradiation changes in the right lung stable.  No definitive evidence of local recurrence of disease.  Small amount of pericardial fluid/thickening as well as small amount of pericardial calcification. - He is continuing to feel tired.  Denies any hemoptysis or change in his baseline cough. - We will schedule him for follow-up in 4 months with repeat CT of the chest.  2.  Fatigue: He was evaluated by cardiology and Lexiscan was negative.  He was started on Imdur.  He was told to follow-up with his sleep physician to titrate CPAP.  3.  Hypothyroidism: -TSH went up to 8.  Upon further questioning, he reports that he has not been taking Synthroid daily. - I have reviewed his medication list.  Dr. Lenna Gilford has prescribed him 50 mcg of Synthroid.  He was told to restart it.

## 2018-03-18 NOTE — Patient Instructions (Signed)
[facility] Discharge Instructions     Thank you for choosing  [facility] at [facility] to provide your oncology and hematology care.  To afford each patient quality time with our provider, please arrive at least 15 minutes before your scheduled appointment time.   If you have a lab appointment with the Cancer Center please come in thru the  Main Entrance and check in at the main information desk  You need to re-schedule your appointment should you arrive 10 or more minutes late.  We strive to give you quality time with our providers, and arriving late affects you and other patients whose appointments are after yours.  Also, if you no show three or more times for appointments you may be dismissed from the clinic at the providers discretion.     Again, thank you for choosing [facility].  Our hope is that these requests will decrease the amount of time that you wait before being seen by our physicians.       _____________________________________________________________  Should you have questions after your visit to [facility], please contact our office at [phone] between the hours of 8:00 a.m. and 4:30 p.m.  Voicemails left after 4:00 p.m. will not be returned until the following business day.  For prescription refill requests, have your pharmacy contact our office and allow 72 hours.    Cancer Center Support Programs:   > Cancer Support Group  2nd Tuesday of the month 1pm-2pm, Journey Room    

## 2018-03-18 NOTE — Progress Notes (Signed)
[place], [place] [address]   CLINIC:  Medical Oncology/Hematology  PCP:  [name], [place] [address] [phone]   REASON FOR VISIT: Follow-up for non small cell lung cancer stage IIIA  CURRENT THERAPY: surveillance   BRIEF ONCOLOGIC HISTORY:    Non-small cell lung cancer, right ([facility])   04/17/2016 Imaging    CT chest- 1. Examination is positive for bilateral upper lobe pulmonary lesions suspicious for malignancy. Further investigation with PET-CT and tissue sampling is advised. 2. Borderline enlarged paratracheal lymph nodes and right hilar nodes. Cannot rule out metastatic adenopathy. Attention to these areas on PET-CT is advised. 3. Emphysema 4. Aortic atherosclerosis and multi vessel coronary artery calcification.    04/18/2016 Initial Diagnosis    Adenocarcinoma of lung, right ([facility])    04/24/2016 PET scan    1. Adjacent hypermetabolic nodules in the RIGHT upper lobe consistent bronchogenic carcinoma. 2. Suspicion of ipsilateral hilar and paratracheal nodal metastasis. 3. Nodule in the LEFT upper lobe with suspicious morphology has a relatively low metabolic activity for size. Favor synchronous primary bronchogenic carcinoma. 4. No metabolic activity associated with a small LEFT lower lobe pulmonary nodule. Recommend attention on follow-up.    05/06/2016 Imaging    MRI brain- Negative for metastatic disease.    05/28/2016 Procedure    1. Electromagnetic navigational bronchoscopy with brushings, needle     aspirations and biopsies of bilateral upper lobe nodules. 2. Endobronchial ultrasound with mediastinal and hilar lymph node     aspirations. By Dr. [name]    06/03/2016 Pathology Results    Diagnosis 1. Lung, biopsy, Right upper lobe - BLOOD AND BENIGN BRONCHIAL AND LUNG TISSUE. 2. Lung, biopsy, Left upper lobe - BLOOD AND BENIGN BRONCHIAL AND LUNG TISSUE.    06/03/2016  Pathology Results    Diagnosis TRANSBRONCHIAL NEEDLE ASPIRATION, NAVIGATION, RUL (SPECIMEN 5 OF 7, COLLECTED 05/28/16): MALIGNANT CELLS CONSISTENT WITH NON-SMALL CELL CARCINOMA Comment There are limited tumor cells in the cell block (insufficient for molecular testing). TTF-1 is positive in a few of the atypical cells. They appear negative for synaptophysin and cytokeratin 5/6. Overall there is limited tumor, but a lung adenocarcinoma is slightly favored. Dr. [name] has reviewed the case. The case was called to Dr. [name] on 06/03/2016.    06/13/2016 Procedure    Port placed by Dr. Arnoldo Morale    06/25/2016 - 08/15/2016 Chemotherapy    The patient had palonosetron (ALOXI) injection 0.25 mg, 0.25 mg, Intravenous,  Once, 6 of 6 cycles  CARBOplatin (PARAPLATIN) 220 mg in sodium chloride 0.9 % 250 mL chemo infusion, 220 mg (100 % of original dose 215.8 mg), Intravenous,  Once, 6 of 6 cycles Dose modification:   (original dose 215.8 mg, Cycle 1),   (original dose 215.8 mg, Cycle 5),   (original dose 215.8 mg, Cycle 6),   (original dose 215.8 mg, Cycle 2),   (original dose 215.8 mg, Cycle 3),   (original dose 215.8 mg, Cycle 4)  PACLitaxel (TAXOL) 90 mg in dextrose 5 % 250 mL chemo infusion ( for chemotherapy treatment.      06/25/2016 - 08/20/2016 Radiation Therapy    Radiation in [place], [place] by Dr. Quitman Livings.  R lung to 6600 cGy    07/09/2016 - 07/10/2016 Hospital Admission    Admit date: 07/09/2016  Admission diagnosis: Fever and chills Additional comments: Negative work-up, discharged with course of Levaquin    07/09/2016 Treatment Plan Change  Treatment deferred x 1 week due to hospitalization     08/05/2016 - 08/06/2016 Hospital Admission    Admit date: 08/05/2016 Admission diagnosis: Joint pain Additional comments: Treated with steroids with symptomatic improvement    08/25/2016 Imaging    MRI brain- Negative for intracranial metastasis on this motion degraded examination.  Stable  mild-to-moderate chronic small vessel ischemic disease.    09/18/2016 PET scan    1. Partial metabolic response. Right upper lobe 2.6 cm hypermetabolic pulmonary nodule, decreased in size and metabolism. Mildly hypermetabolic right hilar nodal metastasis, decreased in metabolism. Right paratracheal lymph node is no longer hypermetabolic. No new or progressive metastatic disease. 2. Apical left upper lobe 0.9 cm solid pulmonary nodule, slightly decreased in size, with no significant metabolism. The change in size could indicate a neoplastic etiology for this nodule. 3. Additional subcentimeter pulmonary nodules in the left lung are stable and below PET resolution . 4. Additional findings include aortic atherosclerosis, coronary atherosclerosis, moderate emphysema, stable small pericardial effusion/thickening and mild sigmoid diverticulosis.    01/13/2017 Imaging    CT chest with contrast IMPRESSION: 1. No change in size of the hypermetabolic nodular thickening in the RIGHT upper lobe. 2. LEFT upper lobe spiculated nodule is stable. 3. Rounded LEFT lower lobe pulmonary nodule is stable. 4. No evidence of lung cancer progression.    05/08/2017 Adverse Reaction    Development of Hypothyroidism- likely secondary to immunotherapy.  Levothyroxine therapy initiated.       CANCER STAGING: Cancer Staging Non-small cell lung cancer, right [facility]) Staging form: Lung, AJCC 8th Edition - Clinical stage from 06/05/2016: Stage IIIA (cT1b(2), cN2, cM0) - Signed by Baird Cancer, PA-C on 08/21/2016    INTERVAL HISTORY:  John [name] 72 y.o. male returns for routine follow-up for non small cell lung cancer. He is here today with his wife.  He recently saw his Cardiologist and will continue to follow up with him. He reports occasional constipation and nausea. He denies any new pains. Denies any nausea, vomiting, or diarrhea. Denies any fevers or recent infections. Denies any new cough or hemoptysis.  Denies any bleeding or easy bruising. He reports his appetite at 75% and his energy level at 50%. He is maintaining his weight at this time.     REVIEW OF SYSTEMS:  Review of Systems  Respiratory: Positive for shortness of breath.   Gastrointestinal: Positive for constipation and nausea.  All other systems reviewed and are negative.    PAST MEDICAL/SURGICAL HISTORY:  Past Medical History:  Diagnosis Date  . Adenocarcinoma of lung, right ([place]) 04/18/2016  . Anxiety   . Arthritis   . Asthma   . CHF (congestive heart failure) ([facility])   . COPD (chronic obstructive pulmonary disease) ([facility] [place])   . Depression   . Diabetes mellitus without complication (HCC)    no meds  . DM (diabetes mellitus) ([facility]) 07/09/2016  . Dyspnea   . History of kidney stones   . Hyperlipidemia   . Hypertension   . Macular degeneration   . Neuropathy   . Non-small cell lung cancer, right ([facility]) 04/18/2016  . Prostatitis   . Pulmonary nodule, left 07/16/2016  . Sleep apnea    cpap   Past Surgical History:  Procedure Laterality Date  . CATARACT EXTRACTION, BILATERAL Bilateral   . NO PAST SURGERIES    . PORTACATH PLACEMENT Left 06/13/2016   Procedure: INSERTION PORT-A-CATH;  Surgeon: Aviva Signs, MD;  Location: [facility];  Service: General;  Laterality: Left;  .  VIDEO BRONCHOSCOPY WITH ENDOBRONCHIAL NAVIGATION N/A 05/28/2016   Procedure: VIDEO BRONCHOSCOPY WITH ENDOBRONCHIAL NAVIGATION;  Surgeon: [name], MD;  Location: [facility];  Service: Thoracic;  Laterality: N/A;  . VIDEO BRONCHOSCOPY WITH ENDOBRONCHIAL ULTRASOUND N/A 05/28/2016   Procedure: VIDEO BRONCHOSCOPY WITH ENDOBRONCHIAL ULTRASOUND;  Surgeon: [name], MD;  Location: [facility];  Service: Thoracic;  Laterality: N/A;     SOCIAL HISTORY:  Social History   Socioeconomic History  . Marital status: Divorced    Spouse name: Not on file  . Number of children: 5  . Years of education: 10  . Highest education level: 10th grade  Occupational  History  . Occupation: Retired  Scientific laboratory technician  . Financial resource strain: Not very hard  . Food insecurity:    Worry: Never true    Inability: Never true  . Transportation needs:    Medical: No    Non-medical: No  Tobacco Use  . Smoking status: Current Every Day Smoker    Packs/day: 0.10    Years: 55.00    Pack years: 5.50    Types: Cigarettes    Start date: 03/11/1961  . Smokeless tobacco: Never Used  . Tobacco comment: smoking 1 to 2 cigarettes per day  Substance and Sexual Activity  . Alcohol use: No  . Drug use: No  . Sexual activity: Yes    Birth control/protection: None  Lifestyle  . Physical activity:    Days per week: 0 days    Minutes per session: 0 min  . Stress: To some extent  Relationships  . Social connections:    Talks on phone: More than three times a week    Gets together: More than three times a week    Attends religious service: Never    Active member of club or organization: No    Attends meetings of clubs or organizations: Never    Relationship status: Divorced  . Intimate partner violence:    Fear of current or ex partner: Not on file    Emotionally abused: Not on file    Physically abused: Not on file    Forced sexual activity: Not on file  Other Topics Concern  . Not on file  Social History Narrative  . Not on file    FAMILY HISTORY:  Family History  Problem Relation Age of Onset  . Hypertension Mother   . Diabetes Father   . Heart disease Father   . Stroke Father   . Hypertension Sister     CURRENT MEDICATIONS:  Outpatient Encounter Medications as of 03/18/2018  Medication Sig Note  . aspirin EC 81 MG tablet Take 81 mg by mouth daily.   . cloNIDine (CATAPRES) 0.1 MG tablet Take 1 tablet (0.1 mg total) by mouth 3 (three) times daily.   . fluticasone (FLONASE) 50 MCG/ACT nasal spray Place 2 sprays into both nostrils daily. 12/05/2016: As needed  . furosemide (LASIX) 20 MG tablet Take 1 tablet (20 mg total) by mouth daily.   Marland Kitchen  gabapentin (NEURONTIN) 300 MG capsule Take 1 capsule in Am and 2 at night   . INCRUSE ELLIPTA 62.5 MCG/INH AEPB TAKE 1 PUFF BY MOUTH EVERY DAY   . levothyroxine (SYNTHROID, LEVOTHROID) 50 MCG tablet Take 1 tablet (50 mcg total) by mouth daily.   Marland Kitchen linagliptin (TRADJENTA) 5 MG TABS tablet Take 1 tablet (5 mg total) by mouth daily.   Marland Kitchen lisinopril-hydrochlorothiazide (PRINZIDE,ZESTORETIC) 20-25 MG tablet Take 1 tablet by mouth daily. 02/10/18 HOLD UNTIL AFTER  CARDIAC CT DUE TO HYPOTENSION PER DR Bronson Ing   . meloxicam (MOBIC) 7.5 MG tablet TAKE 1 TABLET (7.5 MG TOTAL) BY MOUTH DAILY AS NEEDED FOR PAIN (BACK PAIN).   Marland Kitchen metFORMIN (GLUCOPHAGE XR) 500 MG 24 hr tablet Take 1 tablet (500 mg total) by mouth daily with breakfast.   . metoprolol tartrate (LOPRESSOR) 100 MG tablet Take 100 mg 2 hrs before CT   . metoprolol tartrate (LOPRESSOR) 50 MG tablet Take 1 hour before your cardiac CT   . metoprolol tartrate (LOPRESSOR) 50 MG tablet In ADDITION to previous order from  today: Pt needs to take a total of 100 mg Lopressor 2 hrs prior to cardiac CT. (Updated dosing).Pt will be made aware   . ondansetron (ZOFRAN) 8 MG tablet Take 1 tablet (8 mg total) by mouth every 8 (eight) hours as needed for nausea or vomiting.   . potassium chloride (K-DUR) 10 MEQ tablet Take 1 tablet (10 mEq total) by mouth daily.   . pravastatin (PRAVACHOL) 40 MG tablet Take 1 tablet (40 mg total) by mouth every 3 (three) days. M-W-F   . prochlorperazine (COMPAZINE) 10 MG tablet Take 1 tablet (10 mg total) by mouth every 6 (six) hours as needed for nausea or vomiting.   . tamsulosin (FLOMAX) 0.4 MG CAPS capsule TAKE 2 CAPSULES (0.8 MG TOTAL) BY MOUTH DAILY.   . traZODone (DESYREL) 50 MG tablet TAKE 1 TABLET (50 MG TOTAL) BY MOUTH AT BEDTIME AS NEEDED FOR SLEEP.   Enid Cutter HFA 108 (90 Base) MCG/ACT inhaler Please specify directions, refills and quantity    No facility-administered encounter medications on file as of 03/18/2018.      ALLERGIES:  No Known Allergies   PHYSICAL EXAM:  ECOG Performance status: 1  Vitals:   03/18/18 1051  BP: 131/62  Pulse: (!) 102  Resp: 18  Temp: 98 F (36.7 C)  SpO2: 97%   Filed Weights   03/18/18 1051  Weight: 184 lb 8 oz (83.7 kg)    Physical Exam Constitutional:      Appearance: Normal appearance. He is normal weight.  Musculoskeletal: Normal range of motion.  Skin:    General: Skin is warm and dry.  Neurological:     Mental Status: He is alert and oriented to person, place, and time. Mental status is at baseline.  Psychiatric:        Mood and Affect: Mood normal.        Behavior: Behavior normal.        Thought Content: Thought content normal.        Judgment: Judgment normal.      LABORATORY DATA:  I have reviewed the labs as listed.  CBC    Component Value Date/Time   WBC 7.2 03/08/2018 1044   RBC 4.32 03/08/2018 1044   HGB 12.9 (L) 03/08/2018 1044   HGB 12.2 (L) 12/10/2017 1834   HCT 42.0 03/08/2018 1044   HCT 35.6 (L) 12/10/2017 1834   PLT 318 03/08/2018 1044   PLT 530 (H) 12/10/2017 1834   MCV 97.2 03/08/2018 1044   MCV 95 12/10/2017 1834   MCH 29.9 03/08/2018 1044   MCHC 30.7 03/08/2018 1044   RDW 16.1 (H) 03/08/2018 1044   RDW 14.3 12/10/2017 1834   LYMPHSABS 0.7 03/08/2018 1044   LYMPHSABS 0.7 12/10/2017 1834   MONOABS 0.8 03/08/2018 1044   EOSABS 0.2 03/08/2018 1044   EOSABS 0.2 12/10/2017 1834   BASOSABS 0.0 03/08/2018 1044   BASOSABS 0.0 12/10/2017  1834   CMP Latest Ref Rng & Units 03/08/2018 01/11/2018 12/29/2017  Glucose 70 - 99 mg/dL 176(H) 183(H) 173(H)  BUN 8 - 23 mg/dL 19 17 16   Creatinine 0.61 - 1.24 mg/dL 0.98 0.96 0.99  Sodium 135 - 145 mmol/L 136 136 138  Potassium 3.5 - 5.1 mmol/L 3.9 3.7 4.5  Chloride 98 - 111 mmol/L 100 99 97  CO2 22 - 32 mmol/L 29 29 25   Calcium 8.9 - 10.3 mg/dL 8.7(L) 8.9 8.8  Total Protein 6.5 - 8.1 g/dL 7.4 - 6.6  Total Bilirubin 0.3 - 1.2 mg/dL 0.6 - <0.2  Alkaline Phos 38 - 126 U/L 72 -  97  AST 15 - 41 U/L 14(L) - 19  ALT 0 - 44 U/L 12 - 21       DIAGNOSTIC IMAGING:  I have independently reviewed the scans and discussed with the patient.   I have reviewed Francene Finders, NP's note and agree with the documentation.  I personally performed a face-to-face visit, made revisions and my assessment and plan is as follows.    ASSESSMENT & PLAN:   Non-small cell lung cancer, right (HCC) 1.Stage IIIA (cT1BcN2cM0) NSCLC, favoring adenocarcinoma: Scan at diagnosis showed 2 right upper lobe pulmonary nodules measuring 1.7 and 2.0 cm, ipsilateral mediastinal adenopathy, status post bronchoscopy on 05/28/2016, left upper lobe pulmonary nodule not biopsy proven but suspicious with imaging.  He finished chemoradiation therapy with weekly carboplatin and paclitaxel on 08/20/2016.  Consolidation Durvalumab was started on 09/26/2016. -CT scan on 07/28/2017 shows stable disease.  He has completed 24 cycles of durvalumab on 09/25/2017. -CT scan of the chest with contrast dated 10/28/2017 showed stable post therapy change in the right upper lobe with an 8 mm nodule unchanged from April.  Left upper lobe showed 9 mm x 7 mm nodule which is also unchanged.  No evidence of disease progression.   -We discussed the results of the CT chest dated 03/15/2018 shows previously noted pulmonary nodules appear stable.  Chronic postradiation changes in the right lung stable.  No definitive evidence of local recurrence of disease.  Small amount of pericardial fluid/thickening as well as small amount of pericardial calcification. - He is continuing to feel tired.  Denies any hemoptysis or change in his baseline cough. - We will schedule him for follow-up in 4 months with repeat CT of the chest.  2.  Fatigue: He was evaluated by cardiology and Lexiscan was negative.  He was started on Imdur.  He was told to follow-up with his sleep physician to titrate CPAP.  3.  Hypothyroidism: -TSH went up to 8.  Upon further  questioning, he reports that he has not been taking Synthroid daily. - I have reviewed his medication list.  Dr. Lenna Gilford has prescribed him 50 mcg of Synthroid.  He was told to restart it.       Orders placed this encounter:  Orders Placed This Encounter  Procedures  . CT Chest W Contrast  . TSH  . Lactate dehydrogenase  . CBC with Differential/Platelet  . Comprehensive metabolic panel      John Jack, MD [place] [phone]

## 2018-03-22 ENCOUNTER — Encounter: Payer: Self-pay | Admitting: Family

## 2018-03-22 ENCOUNTER — Ambulatory Visit (INDEPENDENT_AMBULATORY_CARE_PROVIDER_SITE_OTHER): Payer: Medicare Other | Admitting: Family

## 2018-03-22 VITALS — BP 133/86 | HR 102 | Temp 97.4°F | Ht 66.5 in | Wt 183.2 lb

## 2018-03-22 DIAGNOSIS — R3912 Poor urinary stream: Secondary | ICD-10-CM | POA: Diagnosis not present

## 2018-03-22 DIAGNOSIS — Z20818 Contact with and (suspected) exposure to other bacterial communicable diseases: Secondary | ICD-10-CM

## 2018-03-22 DIAGNOSIS — M150 Primary generalized (osteo)arthritis: Secondary | ICD-10-CM | POA: Diagnosis not present

## 2018-03-22 DIAGNOSIS — J431 Panlobular emphysema: Secondary | ICD-10-CM | POA: Diagnosis not present

## 2018-03-22 DIAGNOSIS — E1142 Type 2 diabetes mellitus with diabetic polyneuropathy: Secondary | ICD-10-CM

## 2018-03-22 DIAGNOSIS — E039 Hypothyroidism, unspecified: Secondary | ICD-10-CM

## 2018-03-22 DIAGNOSIS — N401 Enlarged prostate with lower urinary tract symptoms: Secondary | ICD-10-CM | POA: Diagnosis not present

## 2018-03-22 DIAGNOSIS — E785 Hyperlipidemia, unspecified: Secondary | ICD-10-CM | POA: Diagnosis not present

## 2018-03-22 DIAGNOSIS — C3491 Malignant neoplasm of unspecified part of right bronchus or lung: Secondary | ICD-10-CM

## 2018-03-22 DIAGNOSIS — G4733 Obstructive sleep apnea (adult) (pediatric): Secondary | ICD-10-CM

## 2018-03-22 DIAGNOSIS — I10 Essential (primary) hypertension: Secondary | ICD-10-CM | POA: Diagnosis not present

## 2018-03-22 DIAGNOSIS — I2510 Atherosclerotic heart disease of native coronary artery without angina pectoris: Secondary | ICD-10-CM

## 2018-03-22 DIAGNOSIS — F17200 Nicotine dependence, unspecified, uncomplicated: Secondary | ICD-10-CM | POA: Diagnosis not present

## 2018-03-22 DIAGNOSIS — I509 Heart failure, unspecified: Secondary | ICD-10-CM

## 2018-03-22 DIAGNOSIS — M159 Polyosteoarthritis, unspecified: Secondary | ICD-10-CM

## 2018-03-22 DIAGNOSIS — E1169 Type 2 diabetes mellitus with other specified complication: Secondary | ICD-10-CM | POA: Diagnosis not present

## 2018-03-22 LAB — BAYER DCA HB A1C WAIVED: HB A1C (BAYER DCA - WAIVED): 7.6 % — ABNORMAL HIGH (ref <7.0)

## 2018-03-22 MED ORDER — ALBUTEROL SULFATE HFA 108 (90 BASE) MCG/ACT IN AERS: INHALATION_SPRAY | RESPIRATORY_TRACT | 1 refills | Status: DC

## 2018-03-22 MED ORDER — UMECLIDINIUM BROMIDE 62.5 MCG/INH IN AEPB: 1.0000 | INHALATION_SPRAY | Freq: Every day | RESPIRATORY_TRACT | 1 refills | Status: DC

## 2018-03-22 MED ORDER — AMOXICILLIN 500 MG PO CAPS: 500.0000 mg | ORAL_CAPSULE | Freq: Two times a day (BID) | ORAL | 0 refills | Status: DC

## 2018-03-22 NOTE — Progress Notes (Signed)
Subjective:    Patient ID: John [name], male    DOB: [birth year], 72 y.o.   MRN: [id number]  Chief Complaint  Patient presents with  . Hypertension    two month recheck  . Diabetes   Pt presents to the office today for chronic follow up. He is followed by Cardiologists every 6 months for CHF. Followed by Oncologists every 6 months for non-small lung cancer.   Pt states he is coughing more and has a slight sore throat. States his wife was diagnosed with strep last week.  Hypertension  This is a chronic problem. The current episode started more than 1 year ago. The problem has been resolved since onset. The problem is controlled. Associated symptoms include blurred vision, malaise/fatigue and peripheral edema. Pertinent negatives include no headaches or shortness of breath. Risk factors for coronary artery disease include diabetes mellitus, dyslipidemia, male gender, obesity and sedentary lifestyle. The current treatment provides moderate improvement. Hypertensive end-organ damage includes CAD/MI, CVA and heart failure. Identifiable causes of hypertension include a thyroid problem.  Diabetes  He presents for his follow-up diabetic visit. He has type 2 diabetes mellitus. His disease course has been stable. There are no hypoglycemic associated symptoms. Pertinent negatives for hypoglycemia include no headaches. Associated symptoms include blurred vision, fatigue and foot paresthesias. There are no hypoglycemic complications. Symptoms are stable. Diabetic complications include a CVA and peripheral neuropathy. Risk factors for coronary artery disease include diabetes mellitus, dyslipidemia, obesity, male sex and sedentary lifestyle. His weight is stable. He is following a generally unhealthy diet. (Does not check BS at home) Eye exam is current.  Thyroid Problem  Presents for follow-up visit. Symptoms include constipation, dry skin and fatigue. Patient reports no diarrhea. The symptoms have been  stable. His past medical history is significant for heart failure.  Arthritis  Presents for follow-up visit. He complains of pain and stiffness. The symptoms have been stable. Affected locations include the right shoulder and left shoulder (back). His pain is at a severity of 4/10. Associated symptoms include fatigue. Pertinent negatives include no diarrhea.  Benign Prostatic Hypertrophy  This is a chronic problem. The current episode started more than 1 year ago. The problem has been waxing and waning since onset. Irritative symptoms include nocturia (1).  OSA States he has to pick up his new CPA next week and has not used one in over a month.  COPD PT continues to smoke 1/2  Pack a day.  Diabetic Neuropathy Has bilateral numbness and tingling in hands and feet. Takes gabapentin 300 mg AM and 600 mg at night and states this greatly helps.    Review of Systems  Constitutional: Positive for fatigue and malaise/fatigue.  Eyes: Positive for blurred vision.  Respiratory: Negative for shortness of breath.   Gastrointestinal: Positive for constipation. Negative for diarrhea.  Genitourinary: Positive for nocturia (1).  Musculoskeletal: Positive for arthritis and stiffness.  Neurological: Negative for headaches.  All other systems reviewed and are negative.      Objective:   Physical Exam Vitals signs reviewed.  Constitutional:      General: He is not in acute distress.    Appearance: He is well-developed.  HENT:     Head: Normocephalic.     Right Ear: Tympanic membrane normal.     Left Ear: Tympanic membrane normal.     Mouth/Throat:     Pharynx: Pharyngeal swelling and posterior oropharyngeal erythema present.  Eyes:     General:  Right eye: No discharge.        Left eye: No discharge.     Pupils: Pupils are equal, round, and reactive to light.  Neck:     Musculoskeletal: Normal range of motion and neck supple.     Thyroid: No thyromegaly.  Cardiovascular:     Rate and  Rhythm: Normal rate and regular rhythm.     Heart sounds: Normal heart sounds. No murmur.  Pulmonary:     Effort: Pulmonary effort is normal. No respiratory distress.     Breath sounds: Wheezing present.  Abdominal:     General: Bowel sounds are normal. There is no distension.     Palpations: Abdomen is soft.     Tenderness: There is no abdominal tenderness.  Musculoskeletal: Normal range of motion.        General: No tenderness.  Skin:    General: Skin is warm and dry.     Findings: No erythema or rash.  Neurological:     Mental Status: He is alert and oriented to person, place, and time.     Cranial Nerves: No cranial nerve deficit.     Deep Tendon Reflexes: Reflexes are normal and symmetric.  Psychiatric:        Behavior: Behavior normal.        Thought Content: Thought content normal.        Judgment: Judgment normal.       BP 133/86   Pulse (!) 102   Temp (!) 97.4 F (36.3 C) (Oral)   Ht 5' 6.5" (1.689 m)   Wt 183 lb 3.2 oz (83.1 kg)   BMI 29.13 kg/m      Assessment & Plan:  Areg Bialas comes in today with chief complaint of Hypertension (two month recheck) and Diabetes   Diagnosis and orders addressed:  1. Essential hypertension - CMP14+EGFR - CBC with Differential/Platelet  2. Congestive heart failure, unspecified HF chronicity, unspecified heart failure type ([place]) - CMP14+EGFR - CBC with Differential/Platelet  3. Panlobular emphysema (HCC) - CMP14+EGFR - CBC with Differential/Platelet - albuterol (VENTOLIN HFA) 108 (90 Base) MCG/ACT inhaler; Please specify directions, refills and quantity  Dispense: 1 Inhaler; Refill: 1 - umeclidinium bromide (INCRUSE ELLIPTA) 62.5 MCG/INH AEPB; Inhale 1 puff into the lungs daily.  Dispense: 90 each; Refill: 1  4. Non-small cell lung cancer, right (HCC) - CMP14+EGFR - CBC with Differential/Platelet - albuterol (VENTOLIN HFA) 108 (90 Base) MCG/ACT inhaler; Please specify directions, refills and quantity  Dispense:  1 Inhaler; Refill: 1  5. OSA (obstructive sleep apnea) - CMP14+EGFR - CBC with Differential/Platelet  6. Type 2 diabetes mellitus with diabetic polyneuropathy, without long-term current use of insulin (HCC) - Bayer DCA Hb A1c Waived - CMP14+EGFR - CBC with Differential/Platelet  7. Hypothyroidism, unspecified type - CMP14+EGFR - CBC with Differential/Platelet - TSH  8. Hyperlipidemia associated with type 2 diabetes mellitus (HCC) - CMP14+EGFR - CBC with Differential/Platelet  9. Primary osteoarthritis involving multiple joints - CMP14+EGFR - CBC with Differential/Platelet  10. Benign prostatic hyperplasia with weak urinary stream - CMP14+EGFR - CBC with Differential/Platelet  11. Current smoker - CMP14+EGFR - CBC with Differential/Platelet  12. Diabetic polyneuropathy associated with type 2 diabetes mellitus (HCC) - CMP14+EGFR - CBC with Differential/Platelet  13. Exposure to strep throat Will treat today since having symptoms - Use a cool mist humidifier  -Use saline nose sprays frequently -Force fluids -For fever or aces or pains- take tylenol or ibuprofen. -Throat lozenges if help -New toothbrush in 3 days -  amoxicillin (AMOXIL) 500 MG capsule; Take 1 capsule (500 mg total) by mouth 2 (two) times daily.  Dispense: 20 capsule; Refill: 0   Labs pending Health Maintenance reviewed Diet and exercise encouraged  Follow up plan: 3 months and keep follow up with specialists   Evelina Dun, FNP

## 2018-03-22 NOTE — Progress Notes (Signed)
, °

## 2018-03-22 NOTE — Patient Instructions (Signed)
Diabetes Mellitus and Nutrition When you have diabetes (diabetes mellitus), it is very important to have healthy eating habits because your blood sugar (glucose) levels are greatly affected by what you eat and drink. Eating healthy foods in the appropriate amounts, at about the same times every day, can help you:  Control your blood glucose.  Lower your risk of heart disease.  Improve your blood pressure.  Reach or maintain a healthy weight.  Every person with diabetes is different, and each person has different needs for a meal plan. Your health care provider may recommend that you work with a diet and nutrition specialist (dietitian) to make a meal plan that is best for you. Your meal plan may vary depending on factors such as:  The calories you need.  The medicines you take.  Your weight.  Your blood glucose, blood pressure, and cholesterol levels.  Your activity level.  Other health conditions you have, such as heart or kidney disease.  How do carbohydrates affect me? Carbohydrates affect your blood glucose level more than any other type of food. Eating carbohydrates naturally increases the amount of glucose in your blood. Carbohydrate counting is a method for keeping track of how many carbohydrates you eat. Counting carbohydrates is important to keep your blood glucose at a healthy level, especially if you use insulin or take certain oral diabetes medicines. It is important to know how many carbohydrates you can safely have in each meal. This is different for every person. Your dietitian can help you calculate how many carbohydrates you should have at each meal and for snack. Foods that contain carbohydrates include:  Bread, cereal, rice, pasta, and crackers.  Potatoes and corn.  Peas, beans, and lentils.  Milk and yogurt.  Fruit and juice.  Desserts, such as cakes, cookies, ice cream, and candy.  How does alcohol affect me? Alcohol can cause a sudden decrease in blood  glucose (hypoglycemia), especially if you use insulin or take certain oral diabetes medicines. Hypoglycemia can be a life-threatening condition. Symptoms of hypoglycemia (sleepiness, dizziness, and confusion) are similar to symptoms of having too much alcohol. If your health care provider says that alcohol is safe for you, follow these guidelines:  Limit alcohol intake to no more than 1 drink per day for nonpregnant women and 2 drinks per day for men. One drink equals 12 oz of beer, 5 oz of wine, or 1 oz of hard liquor.  Do not drink on an empty stomach.  Keep yourself hydrated with water, diet soda, or unsweetened iced tea.  Keep in mind that regular soda, juice, and other mixers may contain a lot of sugar and must be counted as carbohydrates.  What are tips for following this plan? Reading food labels  Start by checking the serving size on the label. The amount of calories, carbohydrates, fats, and other nutrients listed on the label are based on one serving of the food. Many foods contain more than one serving per package.  Check the total grams (g) of carbohydrates in one serving. You can calculate the number of servings of carbohydrates in one serving by dividing the total carbohydrates by 15. For example, if a food has 30 g of total carbohydrates, it would be equal to 2 servings of carbohydrates.  Check the number of grams (g) of saturated and trans fats in one serving. Choose foods that have low or no amount of these fats.  Check the number of milligrams (mg) of sodium in one serving. Most people   should limit total sodium intake to less than 2,300 mg per day.  Always check the nutrition information of foods labeled as "low-fat" or "nonfat". These foods may be higher in added sugar or refined carbohydrates and should be avoided.  Talk to your dietitian to identify your daily goals for nutrients listed on the label. Shopping  Avoid buying canned, premade, or processed foods. These  foods tend to be high in fat, sodium, and added sugar.  Shop around the outside edge of the grocery store. This includes fresh fruits and vegetables, bulk grains, fresh meats, and fresh dairy. Cooking  Use low-heat cooking methods, such as baking, instead of high-heat cooking methods like deep frying.  Cook using healthy oils, such as olive, canola, or sunflower oil.  Avoid cooking with butter, cream, or high-fat meats. Meal planning  Eat meals and snacks regularly, preferably at the same times every day. Avoid going long periods of time without eating.  Eat foods high in fiber, such as fresh fruits, vegetables, beans, and whole grains. Talk to your dietitian about how many servings of carbohydrates you can eat at each meal.  Eat 4-6 ounces of lean protein each day, such as lean meat, chicken, fish, eggs, or tofu. 1 ounce is equal to 1 ounce of meat, chicken, or fish, 1 egg, or 1/4 cup of tofu.  Eat some foods each day that contain healthy fats, such as avocado, nuts, seeds, and fish. Lifestyle   Check your blood glucose regularly.  Exercise at least 30 minutes 5 or more days each week, or as told by your health care provider.  Take medicines as told by your health care provider.  Do not use any products that contain nicotine or tobacco, such as cigarettes and e-cigarettes. If you need help quitting, ask your health care provider.  Work with a counselor or diabetes educator to identify strategies to manage stress and any emotional and social challenges. What are some questions to ask my health care provider?  Do I need to meet with a diabetes educator?  Do I need to meet with a dietitian?  What number can I call if I have questions?  When are the best times to check my blood glucose? Where to find more information:  American Diabetes Association: diabetes.org/food-and-fitness/food  Academy of Nutrition and Dietetics:  www.eatright.org/resources/health/diseases-and-conditions/diabetes  National Institute of Diabetes and Digestive and Kidney Diseases (NIH): www.niddk.nih.gov/health-information/diabetes/overview/diet-eating-physical-activity Summary  A healthy meal plan will help you control your blood glucose and maintain a healthy lifestyle.  Working with a diet and nutrition specialist (dietitian) can help you make a meal plan that is best for you.  Keep in mind that carbohydrates and alcohol have immediate effects on your blood glucose levels. It is important to count carbohydrates and to use alcohol carefully. This information is not intended to replace advice given to you by your health care provider. Make sure you discuss any questions you have with your health care provider. Document Released: 12/19/2004 Document Revised: 04/28/2016 Document Reviewed: 04/28/2016 Elsevier Interactive Patient Education  2018 [employer]  

## 2018-03-23 ENCOUNTER — Other Ambulatory Visit: Payer: Self-pay | Admitting: Family

## 2018-03-23 LAB — CMP14+EGFR
A/G RATIO: 1.2 (ref 1.2–2.2)
ALK PHOS: 93 IU/L (ref 39–117)
ALT: 12 IU/L (ref 0–44)
AST: 15 IU/L (ref 0–40)
Albumin: 3.8 g/dL (ref 3.5–4.8)
BUN/Creatinine Ratio: 18 (ref 10–24)
BUN: 16 mg/dL (ref 8–27)
CHLORIDE: 97 mmol/L (ref 96–106)
CO2: 21 mmol/L (ref 20–29)
Calcium: 9.1 mg/dL (ref 8.6–10.2)
Creatinine, Ser: 0.87 mg/dL (ref 0.76–1.27)
GFR calc Af Amer: 100 mL/min/{1.73_m2} (ref >59)
GFR calc non Af Amer: 86 mL/min/{1.73_m2} (ref >59)
GLUCOSE: 152 mg/dL — AB (ref 65–99)
Globulin, Total: 3.1 g/dL (ref 1.5–4.5)
POTASSIUM: 4.1 mmol/L (ref 3.5–5.2)
Sodium: 139 mmol/L (ref 134–144)
Total Protein: 6.9 g/dL (ref 6.0–8.5)

## 2018-03-23 LAB — CBC WITH DIFFERENTIAL/PLATELET
BASOS ABS: 0.1 10*3/uL (ref 0.0–0.2)
Basos: 1 %
EOS (ABSOLUTE): 0.2 10*3/uL (ref 0.0–0.4)
Eos: 2 %
HEMOGLOBIN: 13.2 g/dL (ref 13.0–17.7)
Hematocrit: 40.1 % (ref 37.5–51.0)
IMMATURE GRANS (ABS): 0 10*3/uL (ref 0.0–0.1)
Immature Granulocytes: 0 %
LYMPHS ABS: 0.8 10*3/uL (ref 0.7–3.1)
LYMPHS: 10 %
MCH: 29.8 pg (ref 26.6–33.0)
MCHC: 32.9 g/dL (ref 31.5–35.7)
MCV: 91 fL (ref 79–97)
MONOCYTES: 11 %
Monocytes Absolute: 0.9 10*3/uL (ref 0.1–0.9)
Neutrophils Absolute: 6 10*3/uL (ref 1.4–7.0)
Neutrophils: 76 %
PLATELETS: 355 10*3/uL (ref 150–450)
RBC: 4.43 x10E6/uL (ref 4.14–5.80)
RDW: 15 % (ref 12.3–15.4)
WBC: 7.9 10*3/uL (ref 3.4–10.8)

## 2018-03-23 LAB — TSH: TSH: 6.39 u[IU]/mL — AB (ref 0.450–4.500)

## 2018-03-23 MED ORDER — LEVOTHYROXINE SODIUM 75 MCG PO TABS: 75.0000 ug | ORAL_TABLET | Freq: Every day | ORAL | 11 refills | Status: DC

## 2018-03-24 ENCOUNTER — Telehealth: Payer: Self-pay | Admitting: *Deleted

## 2018-03-24 DIAGNOSIS — C3491 Malignant neoplasm of unspecified part of right bronchus or lung: Secondary | ICD-10-CM

## 2018-03-24 DIAGNOSIS — R0602 Shortness of breath: Secondary | ICD-10-CM

## 2018-03-24 MED ORDER — ALBUTEROL SULFATE HFA 108 (90 BASE) MCG/ACT IN AERS: 2.0000 | INHALATION_SPRAY | Freq: Four times a day (QID) | RESPIRATORY_TRACT | 1 refills | Status: DC | PRN

## 2018-03-24 NOTE — Telephone Encounter (Signed)
Previous Rx sent to pharmacy

## 2018-03-29 ENCOUNTER — Other Ambulatory Visit: Payer: Self-pay | Admitting: Cardiovascular Disease

## 2018-04-05 ENCOUNTER — Other Ambulatory Visit: Payer: Self-pay | Admitting: Family

## 2018-04-05 ENCOUNTER — Inpatient Hospital Stay (HOSPITAL_COMMUNITY): Payer: Medicare Other | Attending: Hematology

## 2018-04-05 ENCOUNTER — Encounter (HOSPITAL_COMMUNITY): Payer: Self-pay

## 2018-04-05 ENCOUNTER — Other Ambulatory Visit: Payer: Self-pay

## 2018-04-05 DIAGNOSIS — Z452 Encounter for adjustment and management of vascular access device: Secondary | ICD-10-CM | POA: Insufficient documentation

## 2018-04-05 DIAGNOSIS — Z85118 Personal history of other malignant neoplasm of bronchus and lung: Secondary | ICD-10-CM | POA: Diagnosis not present

## 2018-04-05 MED ORDER — SODIUM CHLORIDE 0.9% FLUSH: 10.0000 mL | INTRAVENOUS | Status: DC | PRN

## 2018-04-05 MED ORDER — HEPARIN SOD (PORK) LOCK FLUSH 100 UNIT/ML IV SOLN
500.0000 [IU] | Freq: Once | INTRAVENOUS | Status: AC
  Administered 2018-04-05: 500 [IU] via INTRAVENOUS

## 2018-04-05 NOTE — Progress Notes (Signed)
[place] presented for Portacath access and flush.  Proper placement of portacath confirmed by CXR.  Portacath located left chest wall accessed with  H 20 needle.  No blood return and  Portacath flushed with 39ml NS and 500U/27ml Heparin and needle removed intact.  Procedure tolerated well and without incident.   Discharged ambulatory

## 2018-04-13 ENCOUNTER — Other Ambulatory Visit: Payer: Self-pay | Admitting: Family

## 2018-04-19 ENCOUNTER — Encounter: Payer: Self-pay | Admitting: Cardiovascular Disease

## 2018-04-19 ENCOUNTER — Ambulatory Visit (INDEPENDENT_AMBULATORY_CARE_PROVIDER_SITE_OTHER): Payer: Medicare Other | Admitting: Cardiovascular Disease

## 2018-04-19 VITALS — BP 142/76 | HR 68 | Ht 67.0 in | Wt 185.0 lb

## 2018-04-19 DIAGNOSIS — I313 Pericardial effusion (noninflammatory): Secondary | ICD-10-CM

## 2018-04-19 DIAGNOSIS — E785 Hyperlipidemia, unspecified: Secondary | ICD-10-CM

## 2018-04-19 DIAGNOSIS — I2510 Atherosclerotic heart disease of native coronary artery without angina pectoris: Secondary | ICD-10-CM

## 2018-04-19 DIAGNOSIS — R0609 Other forms of dyspnea: Secondary | ICD-10-CM

## 2018-04-19 DIAGNOSIS — I3139 Other pericardial effusion (noninflammatory): Secondary | ICD-10-CM

## 2018-04-19 DIAGNOSIS — R079 Chest pain, unspecified: Secondary | ICD-10-CM

## 2018-04-19 DIAGNOSIS — G4730 Sleep apnea, unspecified: Secondary | ICD-10-CM

## 2018-04-19 DIAGNOSIS — I5032 Chronic diastolic (congestive) heart failure: Secondary | ICD-10-CM | POA: Diagnosis not present

## 2018-04-19 DIAGNOSIS — Z720 Tobacco use: Secondary | ICD-10-CM | POA: Diagnosis not present

## 2018-04-19 DIAGNOSIS — R9439 Abnormal result of other cardiovascular function study: Secondary | ICD-10-CM | POA: Diagnosis not present

## 2018-04-19 DIAGNOSIS — I10 Essential (primary) hypertension: Secondary | ICD-10-CM | POA: Diagnosis not present

## 2018-04-19 NOTE — Progress Notes (Signed)
SUBJECTIVE: The patient returns for follow-up after undergoing cardiovascular testing performed for the evaluation of chest pain and exertional dyspnea.  Nuclear stress test on 03/12/2018 showed an inferior defect extending from the apex to the base suggestive of scar versus soft tissue attenuation.  There were no ischemic territories.  Chest CT on 03/15/2018 demonstrated coronary artery calcifications as well as aortic and mitral annular calcifications.  Echocardiogram on 01/15/2018 showed normal left ventricular systolic function, LVEF 60 to 65%, mild aortic annular calcifications and moderately calcified leaflets, moderate mitral annular calcification, and possible small anteroapical pericardial effusion with signs of chronicity.  He has chronic exertional dyspnea which is stable.  He had some mild chest pains the other day alleviated with inhalers.  He does have exertional fatigue which he thinks improves with inhalers.  He now smokes about 1/2 pack of cigarettes daily.  He has been smoking since age 89.  His wife is with him today.  She quit smoking on her own and has been smoking for several years.     Review of Systems: As per "subjective", otherwise negative.  No Known Allergies  Current Outpatient Medications  Medication Sig Dispense Refill  . albuterol (PROVENTIL HFA;VENTOLIN HFA) 108 (90 Base) MCG/ACT inhaler Inhale 2 puffs into the lungs every 6 (six) hours as needed for wheezing or shortness of breath. 1 Inhaler 1  . aspirin EC 81 MG tablet Take 81 mg by mouth daily.    . cloNIDine (CATAPRES) 0.1 MG tablet TAKE 1 TABLET (0.1 MG TOTAL) BY MOUTH 3 (THREE) TIMES DAILY. 270 tablet 1  . gabapentin (NEURONTIN) 300 MG capsule Take 1 capsule in Am and 2 at night 270 capsule 3  . levothyroxine (SYNTHROID) 75 MCG tablet Take 1 tablet (75 mcg total) by mouth daily. 30 tablet 11  . levothyroxine (SYNTHROID, LEVOTHROID) 50 MCG tablet Take 1 tablet (50 mcg total) by mouth daily. 90  tablet 3  . linagliptin (TRADJENTA) 5 MG TABS tablet Take 1 tablet (5 mg total) by mouth daily. 90 tablet 3  . metFORMIN (GLUCOPHAGE XR) 500 MG 24 hr tablet Take 1 tablet (500 mg total) by mouth daily with breakfast. 90 tablet 1  . pravastatin (PRAVACHOL) 40 MG tablet Take 1 tablet (40 mg total) by mouth every 3 (three) days. M-W-F 90 tablet 1  . tamsulosin (FLOMAX) 0.4 MG CAPS capsule TAKE 2 CAPSULES (0.8 MG TOTAL) BY MOUTH DAILY. 180 capsule 3  . umeclidinium bromide (INCRUSE ELLIPTA) 62.5 MCG/INH AEPB Inhale 1 puff into the lungs daily. 90 each 1  . dutasteride (AVODART) 0.5 MG capsule TAKE 1 CAPSULE (0.5 MG TOTAL) BY MOUTH DAILY. (Patient not taking: Reported on 04/19/2018) 90 capsule 1  . fluticasone (FLONASE) 50 MCG/ACT nasal spray Place 2 sprays into both nostrils daily. (Patient not taking: Reported on 04/19/2018) 16 g 6  . furosemide (LASIX) 20 MG tablet Take 1 tablet (20 mg total) by mouth daily. (Patient not taking: Reported on 04/19/2018) 30 tablet 3  . meloxicam (MOBIC) 7.5 MG tablet TAKE 1 TABLET (7.5 MG TOTAL) BY MOUTH DAILY AS NEEDED FOR PAIN (BACK PAIN). (Patient not taking: Reported on 04/19/2018) 30 tablet 2  . potassium chloride (K-DUR) 10 MEQ tablet Take 1 tablet (10 mEq total) by mouth daily. 90 tablet 3  . prochlorperazine (COMPAZINE) 10 MG tablet Take 1 tablet (10 mg total) by mouth every 6 (six) hours as needed for nausea or vomiting. (Patient not taking: Reported on 04/19/2018) 60 tablet 2  .  traZODone (DESYREL) 50 MG tablet TAKE 1 TABLET (50 MG TOTAL) BY MOUTH AT BEDTIME AS NEEDED FOR SLEEP. (Patient not taking: Reported on 04/19/2018) 90 tablet 1   No current facility-administered medications for this visit.     Past Medical History:  Diagnosis Date  . Adenocarcinoma of lung, right ([place]) 04/18/2016  . Anxiety   . Arthritis   . Asthma   . CHF (congestive heart failure) ([place])   . COPD (chronic obstructive pulmonary disease) ([place])   . Depression   . Diabetes mellitus  without complication ([facility])    no meds  . DM (diabetes mellitus) ([place]) 07/09/2016  . Dyspnea   . History of kidney stones   . Hyperlipidemia   . Hypertension   . Macular degeneration   . Neuropathy   . Non-small cell lung cancer, right ([place]) 04/18/2016  . Prostatitis   . Pulmonary nodule, left 07/16/2016  . Sleep apnea    cpap    Past Surgical History:  Procedure Laterality Date  . CATARACT EXTRACTION, BILATERAL Bilateral   . NO PAST SURGERIES    . PORTACATH PLACEMENT Left 06/13/2016   Procedure: INSERTION PORT-A-CATH;  Surgeon: Aviva Signs, MD;  Location: [facility];  Service: General;  Laterality: Left;  Marland Kitchen VIDEO BRONCHOSCOPY WITH ENDOBRONCHIAL NAVIGATION N/A 05/28/2016   Procedure: VIDEO BRONCHOSCOPY WITH ENDOBRONCHIAL NAVIGATION;  Surgeon: [name], MD;  Location: [facility];  Service: Thoracic;  Laterality: N/A;  . VIDEO BRONCHOSCOPY WITH ENDOBRONCHIAL ULTRASOUND N/A 05/28/2016   Procedure: VIDEO BRONCHOSCOPY WITH ENDOBRONCHIAL ULTRASOUND;  Surgeon: [name], MD;  Location: [facility];  Service: Thoracic;  Laterality: N/A;    Social History   Socioeconomic History  . Marital status: Divorced    Spouse name: Not on file  . Number of children: 5  . Years of education: 10  . Highest education level: 10th grade  Occupational History  . Occupation: Retired  Scientific laboratory technician  . Financial resource strain: Not very hard  . Food insecurity:    Worry: Never true    Inability: Never true  . Transportation needs:    Medical: No    Non-medical: No  Tobacco Use  . Smoking status: Current Every Day Smoker    Packs/day: 0.10    Years: 55.00    Pack years: 5.50    Types: Cigarettes    Start date: 03/11/1961  . Smokeless tobacco: Never Used  . Tobacco comment: smoking 1 to 2 cigarettes per day  Substance and Sexual Activity  . Alcohol use: No  . Drug use: No  . Sexual activity: Yes    Birth control/protection: None  Lifestyle  . Physical activity:    Days per week: 0 days     Minutes per session: 0 min  . Stress: To some extent  Relationships  . Social connections:    Talks on phone: More than three times a week    Gets together: More than three times a week    Attends religious service: Never    Active member of club or organization: No    Attends meetings of clubs or organizations: Never    Relationship status: Divorced  . Intimate partner violence:    Fear of current or ex partner: Not on file    Emotionally abused: Not on file    Physically abused: Not on file    Forced sexual activity: Not on file  Other Topics Concern  . Not on file  Social History Narrative  . Not on file  Vitals:   04/19/18 1115  BP: (!) 142/76  Pulse: 68  SpO2: 94%  Weight: 185 lb (83.9 kg)  Height: 5\' 7"  (1.702 m)    Wt Readings from Last 3 Encounters:  04/19/18 185 lb (83.9 kg)  03/22/18 183 lb 3.2 oz (83.1 kg)  03/18/18 184 lb 8 oz (83.7 kg)     PHYSICAL EXAM General: NAD HEENT: Normal. Neck: No JVD, no thyromegaly. Lungs: Diminished sounds throughout, no crackles or wheezes. CV: Regular rate and rhythm, normal S1/S2, no S3/S4, no murmur. No pretibial or periankle edema.  No carotid bruit.   Abdomen: Soft, nontender, no distention.  Neurologic: Alert and oriented.  Psych: Normal affect. Skin: Normal. Musculoskeletal: No gross deformities.    ECG: Reviewed above under Subjective   Labs: Lab Results  Component Value Date/Time   K 4.1 03/22/2018 11:28 AM   BUN 16 03/22/2018 11:28 AM   CREATININE 0.87 03/22/2018 11:28 AM   ALT 12 03/22/2018 11:28 AM   TSH 6.390 (H) 03/22/2018 11:28 AM   HGB 13.2 03/22/2018 11:28 AM     Lipids: Lab Results  Component Value Date/Time   LDLCALC 72 12/29/2017 11:18 AM   CHOL 127 12/29/2017 11:18 AM   TRIG 148 12/29/2017 11:18 AM   HDL 25 (L) 12/29/2017 11:18 AM       ASSESSMENT AND PLAN:  1.  Chronic diastolic heart failure: Currently euvolemic on Lasix 20 mg daily.  LVEF 60 to 65% by echocardiogram  in October 2019 as detailed above.  2.  Chest pain and exertional dyspnea: Symptoms likely due to COPD.  Nuclear stress test reviewed above with a defect suggestive of scar versus soft tissue attenuation.  Continue aspirin and statin. He did not tolerate metoprolol 25 mg twice daily nor Imdur.  If symptoms were to get worse and they are not deemed pulmonary in etiology, I would consider cardiac catheterization.  3.  Hypertension: Mildly elevated today but normal at last visit.  No changes to therapy.  4.  Hyperlipidemia: Lipids reviewed above.  Continue pravastatin.  5.  Pericardial effusion: Possible small anteroapical effusion which appeared chronic.  6.  Sleep apnea: Currently on CPAP.  7.  Tobacco abuse: He currently smokes 1/2 pack of cigarettes daily and has been smoking since age 72.  We talked about the importance of cessation.   Disposition: Follow up 6 months   [name], M.D., F.A.C.C.

## 2018-04-19 NOTE — Patient Instructions (Signed)
Medication Instructions:  Your physician recommends that you continue on your current medications as directed. Please refer to the Current Medication list given to you today.  If you need a refill on your cardiac medications before your next appointment, please call your pharmacy.   Lab work: None today If you have labs (blood work) drawn today and your tests are completely normal, you will receive your results only by: Marland Kitchen MyChart Message (if you have MyChart) OR . A paper copy in the mail If you have any lab test that is abnormal or we need to change your treatment, we will call you to review the results.  Testing/Procedures: None today  Follow-Up: At [facility], you and your health needs are our priority.  As part of our continuing mission to provide you with exceptional heart care, we have created designated Provider Care Teams.  These Care Teams include your primary Cardiologist (physician) and Advanced Practice Providers (APPs -  Physician Assistants and Nurse Practitioners) who all work together to provide you with the care you need, when you need it. You will need a follow up appointment in 6 months.  Please call our office 2 months in advance to schedule this appointment.  You may see [name], MD or one of the following Advanced Practice Providers on your designated Care Team:   Bernerd Pho, PA-C [facility]) . Ermalinda Barrios, PA-C ([place])  Any Other Special Instructions Will Be Listed Below (If Applicable). None

## 2018-04-23 ENCOUNTER — Encounter: Payer: Self-pay | Admitting: Family

## 2018-04-23 ENCOUNTER — Ambulatory Visit (INDEPENDENT_AMBULATORY_CARE_PROVIDER_SITE_OTHER): Payer: Medicare Other | Admitting: Family

## 2018-04-23 VITALS — BP 127/70 | HR 106 | Temp 97.3°F | Ht 67.0 in | Wt 186.6 lb

## 2018-04-23 DIAGNOSIS — C3491 Malignant neoplasm of unspecified part of right bronchus or lung: Secondary | ICD-10-CM | POA: Diagnosis not present

## 2018-04-23 DIAGNOSIS — Z20818 Contact with and (suspected) exposure to other bacterial communicable diseases: Secondary | ICD-10-CM

## 2018-04-23 DIAGNOSIS — F5101 Primary insomnia: Secondary | ICD-10-CM | POA: Diagnosis not present

## 2018-04-23 DIAGNOSIS — J029 Acute pharyngitis, unspecified: Secondary | ICD-10-CM | POA: Diagnosis not present

## 2018-04-23 DIAGNOSIS — R110 Nausea: Secondary | ICD-10-CM | POA: Diagnosis not present

## 2018-04-23 DIAGNOSIS — I2510 Atherosclerotic heart disease of native coronary artery without angina pectoris: Secondary | ICD-10-CM

## 2018-04-23 LAB — RAPID STREP SCREEN (MED CTR MEBANE ONLY): Strep Gp A Ag, IA W/Reflex: NEGATIVE

## 2018-04-23 LAB — CULTURE, GROUP A STREP

## 2018-04-23 MED ORDER — TRAZODONE HCL 50 MG PO TABS: 100.0000 mg | ORAL_TABLET | Freq: Every evening | ORAL | 1 refills | Status: DC | PRN

## 2018-04-23 MED ORDER — PROCHLORPERAZINE MALEATE 10 MG PO TABS: 10.0000 mg | ORAL_TABLET | Freq: Four times a day (QID) | ORAL | 2 refills | Status: DC | PRN

## 2018-04-23 MED ORDER — AZITHROMYCIN 250 MG PO TABS: ORAL_TABLET | ORAL | 0 refills | Status: DC

## 2018-04-23 NOTE — Patient Instructions (Signed)
Pharyngitis  Pharyngitis is redness, pain, and swelling (inflammation) of the throat (pharynx). It is a very common cause of sore throat. Pharyngitis can be caused by a bacteria, but it is usually caused by a virus. Most cases of pharyngitis get better on their own without treatment. What are the causes? This condition may be caused by:  Infection by viruses (viral). Viral pharyngitis spreads from person to person (is contagious) through coughing, sneezing, and sharing of personal items or utensils such as cups, forks, spoons, and toothbrushes.  Infection by bacteria (bacterial). Bacterial pharyngitis may be spread by touching the nose or face after coming in contact with the bacteria, or through more intimate contact, such as kissing.  Allergies. Allergies can cause buildup of mucus in the throat (post-nasal drip), leading to inflammation and irritation. Allergies can also cause blocked nasal passages, forcing breathing through the mouth, which dries and irritates the throat. What increases the risk? You are more likely to develop this condition if:  You are 5-24 years old.  You are exposed to crowded environments such as daycare, school, or dormitory living.  You live in a cold climate.  You have a weakened disease-fighting (immune) system. What are the signs or symptoms? Symptoms of this condition vary by the cause (viral, bacterial, or allergies) and can include:  Sore throat.  Fatigue.  Low-grade fever.  Headache.  Joint pain and muscle aches.  Skin rashes.  Swollen glands in the throat (lymph nodes).  Plaque-like film on the throat or tonsils. This is often a symptom of bacterial pharyngitis.  Vomiting.  Stuffy nose (nasal congestion).  Cough.  Red, itchy eyes (conjunctivitis).  Loss of appetite. How is this diagnosed? This condition is often diagnosed based on your medical history and a physical exam. Your health care provider will ask you questions about your  illness and your symptoms. A swab of your throat may be done to check for bacteria (rapid strep test). Other lab tests may also be done, depending on the suspected cause, but these are rare. How is this treated? This condition usually gets better in 3-4 days without medicine. Bacterial pharyngitis may be treated with antibiotic medicines. Follow these instructions at home:  Take over-the-counter and prescription medicines only as told by your health care provider. ? If you were prescribed an antibiotic medicine, take it as told by your health care provider. Do not stop taking the antibiotic even if you start to feel better. ? Do not give children aspirin because of the association with Reye syndrome.  Drink enough water and fluids to keep your urine clear or pale yellow.  Get a lot of rest.  Gargle with a salt-water mixture 3-4 times a day or as needed. To make a salt-water mixture, completely dissolve -1 tsp of salt in 1 cup of warm water.  If your health care provider approves, you may use throat lozenges or sprays to soothe your throat. Contact a health care provider if:  You have large, tender lumps in your neck.  You have a rash.  You cough up green, yellow-brown, or bloody spit. Get help right away if:  Your neck becomes stiff.  You drool or are unable to swallow liquids.  You cannot drink or take medicines without vomiting.  You have severe pain that does not go away, even after you take medicine.  You have trouble breathing, and it is not caused by a stuffy nose.  You have new pain and swelling in your joints such as   the knees, ankles, wrists, or elbows. Summary  Pharyngitis is redness, pain, and swelling (inflammation) of the throat (pharynx).  While pharyngitis can be caused by a bacteria, the most common causes are viral.  Most cases of pharyngitis get better on their own without treatment.  Bacterial pharyngitis is treated with antibiotic medicines. This  information is not intended to replace advice given to you by your health care provider. Make sure you discuss any questions you have with your health care provider. Document Released: 03/24/2005 Document Revised: 04/29/2016 Document Reviewed: 04/29/2016 Elsevier Interactive Patient Education  2019 Elsevier Inc.  

## 2018-04-23 NOTE — Progress Notes (Signed)
Subjective:    Patient ID: John [name], male    DOB: 10-11-1945, 73 y.o.   MRN: [id number]  Chief Complaint  Patient presents with  . Sore Throat    congestion and cough   Pt presents to the office today with sore throat. He states his wife was diagnosed with strep throat three days ago. He states his throat started within the last few days.   He is also requesting a refill of his nausea medication. He had NS lung cancer and states the compazine helps with the intermittent nausea.  Sore Throat   This is a recurrent problem. The current episode started 1 to 4 weeks ago. The problem has been waxing and waning. There has been no fever. The pain is moderate. Associated symptoms include congestion ("some") and headaches. Pertinent negatives include no coughing, ear discharge or ear pain. He has had exposure to strep. Exposure to: wife was positive for strep 3 days ago. He has tried acetaminophen for the symptoms. The treatment provided mild relief.  Insomnia  Primary symptoms: difficulty falling asleep, frequent awakening.  The current episode started more than one year. The onset quality is gradual. The problem occurs intermittently. The problem has been waxing and waning since onset. The treatment provided no relief.      Review of Systems  HENT: Positive for congestion ("some"). Negative for ear discharge and ear pain.   Respiratory: Negative for cough.   Neurological: Positive for headaches.  Psychiatric/Behavioral: The patient has insomnia.   All other systems reviewed and are negative.      Objective:   Physical Exam Vitals signs reviewed.  Constitutional:      General: He is not in acute distress.    Appearance: He is well-developed.  HENT:     Head: Normocephalic.     Right Ear: Tympanic membrane and external ear normal.     Left Ear: Tympanic membrane and external ear normal.     Mouth/Throat:     Pharynx: Posterior oropharyngeal erythema present.  Eyes:   General:        Right eye: No discharge.        Left eye: No discharge.     Pupils: Pupils are equal, round, and reactive to light.  Neck:     Musculoskeletal: Normal range of motion and neck supple.     Thyroid: No thyromegaly.  Cardiovascular:     Rate and Rhythm: Normal rate and regular rhythm.     Heart sounds: Normal heart sounds. No murmur.  Pulmonary:     Effort: Pulmonary effort is normal. No respiratory distress.     Breath sounds: Normal breath sounds. No wheezing.  Abdominal:     General: Bowel sounds are normal. There is no distension.     Palpations: Abdomen is soft.     Tenderness: There is no abdominal tenderness.  Musculoskeletal: Normal range of motion.        General: No tenderness.  Skin:    General: Skin is warm and dry.     Findings: No erythema or rash.  Neurological:     Mental Status: He is alert and oriented to person, place, and time.     Cranial Nerves: No cranial nerve deficit.     Deep Tendon Reflexes: Reflexes are normal and symmetric.  Psychiatric:        Behavior: Behavior normal.        Thought Content: Thought content normal.        Judgment: Judgment  normal.       BP 127/70   Pulse (!) 106   Temp (!) 97.3 F (36.3 C) (Oral)   Ht 5\' 7"  (1.702 m)   Wt 186 lb 9.6 oz (84.6 kg)   BMI 29.23 kg/m      Assessment & Plan:  Khiree Bukhari comes in today with chief complaint of Sore Throat (congestion and cough)   Diagnosis and orders addressed:  1. Sore throat - Rapid Strep Screen ([facility] ONLY)  2. Non-small cell lung cancer, right ([place]) Keep Oncologists appt - prochlorperazine (COMPAZINE) 10 MG tablet; Take 1 tablet (10 mg total) by mouth every 6 (six) hours as needed for nausea or vomiting.  Dispense: 60 tablet; Refill: 2  3. Nausea without vomiting Force fluids - prochlorperazine (COMPAZINE) 10 MG tablet; Take 1 tablet (10 mg total) by mouth every 6 (six) hours as needed for nausea or vomiting.  Dispense: 60 tablet;  Refill: 2  4. Primary insomnia Start Trazodone Sleep ritual  - traZODone (DESYREL) 50 MG tablet; Take 2 tablets (100 mg total) by mouth at bedtime as needed for sleep.  Dispense: 180 tablet; Refill: 1  5. Acute pharyngitis, unspecified etiology - Take meds as prescribed - Use a cool mist humidifier  -Use saline nose sprays frequently -Force fluids -For any cough or congestion  Use plain Mucinex- regular strength or max strength is fine -For fever or aces or pains- take tylenol or ibuprofen. -Throat lozenges if help -New toothbrush in 3 days - azithromycin (ZITHROMAX) 250 MG tablet; Take 500 mg once, then 250 mg for four days  Dispense: 6 tablet; Refill: 0  6. Exposure to strep throat - azithromycin (ZITHROMAX) 250 MG tablet; Take 500 mg once, then 250 mg for four days  Dispense: 6 tablet; Refill: 0     Evelina Dun, FNP

## 2018-04-26 ENCOUNTER — Ambulatory Visit: Payer: Medicare Other | Admitting: Cardiovascular Disease

## 2018-05-07 DIAGNOSIS — H43812 Vitreous degeneration, left eye: Secondary | ICD-10-CM | POA: Diagnosis not present

## 2018-05-07 DIAGNOSIS — H353131 Nonexudative age-related macular degeneration, bilateral, early dry stage: Secondary | ICD-10-CM | POA: Diagnosis not present

## 2018-05-07 DIAGNOSIS — H43813 Vitreous degeneration, bilateral: Secondary | ICD-10-CM | POA: Diagnosis not present

## 2018-05-07 DIAGNOSIS — H04123 Dry eye syndrome of bilateral lacrimal glands: Secondary | ICD-10-CM | POA: Diagnosis not present

## 2018-05-07 LAB — HM DIABETES EYE EXAM

## 2018-05-09 ENCOUNTER — Other Ambulatory Visit: Payer: Self-pay | Admitting: Family

## 2018-05-18 ENCOUNTER — Other Ambulatory Visit: Payer: Self-pay | Admitting: *Deleted

## 2018-05-18 MED ORDER — MELOXICAM 7.5 MG PO TABS: 7.5000 mg | ORAL_TABLET | Freq: Every day | ORAL | 1 refills | Status: DC | PRN

## 2018-05-26 ENCOUNTER — Encounter: Payer: Self-pay | Admitting: Nurse Practitioner

## 2018-05-27 ENCOUNTER — Other Ambulatory Visit: Payer: Self-pay | Admitting: *Deleted

## 2018-05-27 MED ORDER — GABAPENTIN 300 MG PO CAPS: ORAL_CAPSULE | ORAL | 0 refills | Status: DC

## 2018-05-27 NOTE — Progress Notes (Addendum)
[facility]  PATIENT: John [name] DOB: 1945/06/15   REASON FOR VISIT: Follow-up for newly diagnosed obstructive sleep apnea here for initial CPAP HISTORY FROM: Patient   HISTORY OF PRESENT ILLNESS:UPDATE 2/24/2020CM John [name], 73 year old male returns for follow-up with history of obstructive sleep apnea and old machine.  He is now received a new machine .  He denies that his mask is leaking he has not called the equipment company.  CPAP data dated 04/27/2018-05/26/2018 shows compliance greater than 4 hours at 100%.  Average usage 6 hours 37 minutes.  Set pressure 7 to 15 cm.  EPR level 3 leak 95th percentile 39.4 AHI 2.6 ESS 9.  He returns for reevaluation    11/4/19[name] is a 73 year old right-handed gentleman with an underlying medical history of lung cancer (SCLC with s/p chemo and radiation), hypertension, congestive heart failure, diabetes with associated neuropathy, and overweight state, who was previously diagnosed with obstructive sleep apnea and placed on CPAP therapy. Prior sleep study results are not available for my review today. He reports that sleep study testing was about 10 years ago. He has an older CPAP machine. I reviewed your office note from 01/12/2018. I reviewed his CPAP compliance data from 01/04/2018 through 02/02/2018 which is a total of 30 days, during which time he used his machine 29 days with percent used days greater than 4 hours at 56.7%, indicating suboptimal compliance with an average usage of 5 hours and 1 minute for days on treatment. Residual AHI 1.7 per hour, pressure of 13 cm with a ramp time of 10 minutes, starting at 5 cm. He has a C-Flex setting of 3. He has a Respironics REM star auto machine. His Epworth sleepiness score is 6 out of 24, fatigue score is 44 out of 63. REVIEW OF SYSTEMS: Full 14 system review of systems performed and notable only for those listed, all others are neg:  Constitutional: neg  Cardiovascular:  neg Ear/Nose/Throat: neg  Skin: neg Eyes: neg Respiratory: COPD Gastroitestinal: neg  Hematology/Lymphatic: neg  Endocrine: neg Musculoskeletal:neg Allergy/Immunology: neg Neurological: neg Psychiatric: neg Sleep : Obstructive sleep apnea here for CPAP compliance with new machine   ALLERGIES: No Known Allergies  HOME MEDICATIONS: Outpatient Medications Prior to Visit  Medication Sig Dispense Refill  . albuterol (PROVENTIL HFA;VENTOLIN HFA) 108 (90 Base) MCG/ACT inhaler Inhale 2 puffs into the lungs every 6 (six) hours as needed for wheezing or shortness of breath. 1 Inhaler 1  . aspirin EC 81 MG tablet Take 81 mg by mouth daily.    . cloNIDine (CATAPRES) 0.1 MG tablet TAKE 1 TABLET (0.1 MG TOTAL) BY MOUTH 3 (THREE) TIMES DAILY. 270 tablet 1  . dutasteride (AVODART) 0.5 MG capsule TAKE 1 CAPSULE (0.5 MG TOTAL) BY MOUTH DAILY. 90 capsule 1  . fluticasone (FLONASE) 50 MCG/ACT nasal spray Place 2 sprays into both nostrils daily. 16 g 6  . furosemide (LASIX) 20 MG tablet Take 1 tablet (20 mg total) by mouth daily. (Patient taking differently: Take 20 mg by mouth daily as needed. ) 30 tablet 3  . gabapentin (NEURONTIN) 300 MG capsule Take 1 capsule in Am and 2 at night 270 capsule 0  . levothyroxine (SYNTHROID) 75 MCG tablet Take 1 tablet (75 mcg total) by mouth daily. 30 tablet 11  . linagliptin (TRADJENTA) 5 MG TABS tablet Take 1 tablet (5 mg total) by mouth daily. 90 tablet 3  . meloxicam (MOBIC) 7.5 MG tablet Take 1 tablet (7.5 mg total) by mouth daily  as needed for pain (back pain). 90 tablet 1  . metFORMIN (GLUCOPHAGE XR) 500 MG 24 hr tablet Take 1 tablet (500 mg total) by mouth daily with breakfast. 90 tablet 1  . pravastatin (PRAVACHOL) 40 MG tablet Take 1 tablet (40 mg total) by mouth every 3 (three) days. M-W-F 90 tablet 1  . prochlorperazine (COMPAZINE) 10 MG tablet Take 1 tablet (10 mg total) by mouth every 6 (six) hours as needed for nausea or vomiting. 60 tablet 2  .  tamsulosin (FLOMAX) 0.4 MG CAPS capsule TAKE 2 CAPSULES (0.8 MG TOTAL) BY MOUTH DAILY. 180 capsule 3  . traZODone (DESYREL) 50 MG tablet Take 2 tablets (100 mg total) by mouth at bedtime as needed for sleep. 180 tablet 1  . umeclidinium bromide (INCRUSE ELLIPTA) 62.5 MCG/INH AEPB Inhale 1 puff into the lungs daily. 90 each 1  . potassium chloride (K-DUR) 10 MEQ tablet Take 1 tablet (10 mEq total) by mouth daily. (Patient taking differently: Take 10 mEq by mouth daily as needed. ) 90 tablet 3  . azithromycin (ZITHROMAX) 250 MG tablet Take 500 mg once, then 250 mg for four days (Patient not taking: Reported on 05/31/2018) 6 tablet 0  . levothyroxine (SYNTHROID, LEVOTHROID) 50 MCG tablet Take 1 tablet (50 mcg total) by mouth daily. (Patient not taking: Reported on 05/31/2018) 90 tablet 3   No facility-administered medications prior to visit.     PAST MEDICAL HISTORY: Past Medical History:  Diagnosis Date  . Adenocarcinoma of lung, right ([place]) 04/18/2016  . Anxiety   . Arthritis   . Asthma   . CHF (congestive heart failure) ([place])   . COPD (chronic obstructive pulmonary disease) ([place])   . Depression   . Diabetes mellitus without complication (HCC)    no meds  . DM (diabetes mellitus) ([place]) 07/09/2016  . Dyspnea   . History of kidney stones   . Hyperlipidemia   . Hypertension   . Macular degeneration   . Neuropathy   . Non-small cell lung cancer, right ([place]) 04/18/2016  . Prostatitis   . Pulmonary nodule, left 07/16/2016  . Sleep apnea    cpap    PAST SURGICAL HISTORY: Past Surgical History:  Procedure Laterality Date  . CATARACT EXTRACTION, BILATERAL Bilateral   . NO PAST SURGERIES    . PORTACATH PLACEMENT Left 06/13/2016   Procedure: INSERTION PORT-A-CATH;  Surgeon: Aviva Signs, MD;  Location: [facility];  Service: General;  Laterality: Left;  Marland Kitchen VIDEO BRONCHOSCOPY WITH ENDOBRONCHIAL NAVIGATION N/A 05/28/2016   Procedure: VIDEO BRONCHOSCOPY WITH ENDOBRONCHIAL NAVIGATION;  Surgeon: [name], MD;  Location: [facility];  Service: Thoracic;  Laterality: N/A;  . VIDEO BRONCHOSCOPY WITH ENDOBRONCHIAL ULTRASOUND N/A 05/28/2016   Procedure: VIDEO BRONCHOSCOPY WITH ENDOBRONCHIAL ULTRASOUND;  Surgeon: [name], MD;  Location: [facility];  Service: Thoracic;  Laterality: N/A;    FAMILY HISTORY: Family History  Problem Relation Age of Onset  . Hypertension Mother   . Diabetes Father   . Heart disease Father   . Stroke Father   . Hypertension Sister     SOCIAL HISTORY: Social History   Socioeconomic History  . Marital status: Divorced    Spouse name: Not on file  . Number of children: 5  . Years of education: 10  . Highest education level: 10th grade  Occupational History  . Occupation: Retired  Scientific laboratory technician  . Financial resource strain: Not very hard  . Food insecurity:    Worry: Never true  Inability: Never true  . Transportation needs:    Medical: No    Non-medical: No  Tobacco Use  . Smoking status: Current Every Day Smoker    Packs/day: 0.10    Years: 55.00    Pack years: 5.50    Types: Cigarettes    Start date: 03/11/1961  . Smokeless tobacco: Never Used  . Tobacco comment: smoking 1 to 2 cigarettes per day  Substance and Sexual Activity  . Alcohol use: No  . Drug use: No  . Sexual activity: Yes    Birth control/protection: None  Lifestyle  . Physical activity:    Days per week: 0 days    Minutes per session: 0 min  . Stress: To some extent  Relationships  . Social connections:    Talks on phone: More than three times a week    Gets together: More than three times a week    Attends religious service: Never    Active member of club or organization: No    Attends meetings of clubs or organizations: Never    Relationship status: Divorced  . Intimate partner violence:    Fear of current or ex partner: Not on file    Emotionally abused: Not on file    Physically abused: Not on file    Forced sexual activity: Not on file  Other Topics  Concern  . Not on file  Social History Narrative  . Not on file     PHYSICAL EXAM  Vitals:   05/31/18 1045  BP: (!) 169/94  Pulse: (!) 110  SpO2: 95%  Weight: 184 lb 9.6 oz (83.7 kg)  Height: 5\' 7"  (1.702 m)   Body mass index is 28.91 kg/m.  Generalized: Well developed, in no acute distress  Head: normocephalic and atraumatic,. Oropharynx benign  Neck: Supple,  Musculoskeletal: No deformity   Neurological examination   Mentation: Alert oriented to time, place, history taking. Attention span and concentration appropriate. Recent and remote memory intact.  Follows all commands speech and language fluent.   Cranial nerve II-XII: Pupils were equal round reactive to light extraocular movements were full, visual field were full on confrontational test. Facial sensation and strength were normal. hearing was intact to finger rubbing bilaterally. Uvula tongue midline. head turning and shoulder shrug were normal and symmetric.Tongue protrusion into cheek strength was normal. Motor: normal bulk and tone, full strength in the BUE, BLE,  Sensory: normal and symmetric to light touch,  Coordination: finger-nose-finger, heel-to-shin bilaterally, no dysmetria Gait and Station: Rising up from seated position without assistance, normal stance,  moderate stride, good arm swing, smooth turning, able to perform tiptoe, and heel walking without difficulty. Tandem gait is unsteady  DIAGNOSTIC DATA (LABS, IMAGING, TESTING) - I reviewed patient records, labs, notes, testing and imaging myself where available.  Lab Results  Component Value Date   WBC 7.9 03/22/2018   HGB 13.2 03/22/2018   HCT 40.1 03/22/2018   MCV 91 03/22/2018   PLT 355 03/22/2018      Component Value Date/Time   NA 139 03/22/2018 1128   K 4.1 03/22/2018 1128   CL 97 03/22/2018 1128   CO2 21 03/22/2018 1128   GLUCOSE 152 (H) 03/22/2018 1128   GLUCOSE 176 (H) 03/08/2018 1044   BUN 16 03/22/2018 1128   CREATININE 0.87  03/22/2018 1128   CALCIUM 9.1 03/22/2018 1128   PROT 6.9 03/22/2018 1128   ALBUMIN 3.8 03/22/2018 1128   AST 15 03/22/2018 1128   ALT 12 03/22/2018  [place] 03/22/2018 1128   BILITOT <0.2 03/22/2018 1128   GFRNONAA 86 03/22/2018 1128   GFRAA 100 03/22/2018 1128   Lab Results  Component Value Date   CHOL 127 12/29/2017   HDL 25 (L) 12/29/2017   LDLCALC 72 12/29/2017   TRIG 148 12/29/2017   CHOLHDL 5.1 (H) 12/29/2017   Lab Results  Component Value Date   HGBA1C 7.6 (H) 03/22/2018   No results found for: [id number] Lab Results  Component Value Date   TSH 6.390 (H) 03/22/2018      ASSESSMENT AND PLAN John [name] is a very pleasant 73 y.o.-year old male with an underlying medical history of lung cancer (SCLC with s/p chemo and radiation), hypertension, congestive heart failure, diabetes with associated neuropathy, and overweight state, who presents for reevaluation of his prior diagnosis of obstructive sleep apnea. He has been on CPAP but has not been fully compliant with treatment, he has had difficulty with the pressure setting as well as high leak, using older equipment and an older machine.  He now has a new machine but continues to have a leak. CPAP data dated 04/27/2018-05/26/2018 shows compliance greater than 4 hours at 100%.  Average usage 6 hours 37 minutes.  Set pressure 7 to 15 cm.  EPR level 3 leak 95th percentile 39.4 AHI 2.6 ESS 9.   PLAN:CPAP compliance 100% Has significant leak needs mask refit Will send order to advanced home care Follow-up in 3 months for repeat compliance Dennie Bible, [facility], [facility], APRN  [facility] [address] [place], [place] [address] [phone]  I reviewed the above note and documentation by the Nurse Practitioner and agree with the history, physical exam, assessment and plan as outlined above. I was immediately available for face-to-face consultation. Star Age, MD, PhD [facility])

## 2018-05-31 ENCOUNTER — Encounter: Payer: Self-pay | Admitting: Nurse Practitioner

## 2018-05-31 ENCOUNTER — Ambulatory Visit (INDEPENDENT_AMBULATORY_CARE_PROVIDER_SITE_OTHER): Payer: Medicare Other | Admitting: Nurse Practitioner

## 2018-05-31 VITALS — BP 169/94 | HR 110 | Ht 67.0 in | Wt 184.6 lb

## 2018-05-31 DIAGNOSIS — I2510 Atherosclerotic heart disease of native coronary artery without angina pectoris: Secondary | ICD-10-CM

## 2018-05-31 DIAGNOSIS — G4733 Obstructive sleep apnea (adult) (pediatric): Secondary | ICD-10-CM

## 2018-05-31 DIAGNOSIS — Z9989 Dependence on other enabling machines and devices: Secondary | ICD-10-CM | POA: Insufficient documentation

## 2018-05-31 NOTE — Patient Instructions (Signed)
CPAP compliance 100% Has significant leak needs mask refit Will send order to advanced home care Follow-up in 3 months for repeat compliance

## 2018-05-31 NOTE — Progress Notes (Signed)
Received confirmation that Marianna, S received cpap orders for pt thru CM. sy

## 2018-06-01 ENCOUNTER — Other Ambulatory Visit: Payer: Self-pay | Admitting: *Deleted

## 2018-06-07 ENCOUNTER — Inpatient Hospital Stay (HOSPITAL_COMMUNITY): Payer: Medicare Other | Attending: Hematology

## 2018-06-07 ENCOUNTER — Other Ambulatory Visit: Payer: Self-pay

## 2018-06-07 ENCOUNTER — Encounter (HOSPITAL_COMMUNITY): Payer: Self-pay

## 2018-06-07 DIAGNOSIS — Z452 Encounter for adjustment and management of vascular access device: Secondary | ICD-10-CM | POA: Insufficient documentation

## 2018-06-07 DIAGNOSIS — Z85118 Personal history of other malignant neoplasm of bronchus and lung: Secondary | ICD-10-CM | POA: Diagnosis not present

## 2018-06-07 MED ORDER — SODIUM CHLORIDE 0.9% FLUSH
10.0000 mL | Freq: Once | INTRAVENOUS | Status: AC
  Administered 2018-06-07: 10 mL via INTRAVENOUS

## 2018-06-07 MED ORDER — HEPARIN SOD (PORK) LOCK FLUSH 100 UNIT/ML IV SOLN
500.0000 [IU] | Freq: Once | INTRAVENOUS | Status: AC
  Administered 2018-06-07: 500 [IU] via INTRAVENOUS

## 2018-06-07 NOTE — Patient Instructions (Signed)
[facility]  Discharge Instructions:   _______________________________________________________________  Thank you for choosing [facility] to provide your oncology and hematology care.  To afford each patient quality time with our providers, please arrive at least 15 minutes before your scheduled appointment.  You need to re-schedule your appointment if you arrive 10 or more minutes late.  We strive to give you quality time with our providers, and arriving late affects you and other patients whose appointments are after yours.  Also, if you no show three or more times for appointments you may be dismissed from the clinic.  Again, thank you for choosing  [facility]. Our hope is that these requests will allow you access to exceptional care and in a timely manner. _______________________________________________________________  If you have questions after your visit, please contact our office at [phone] between the hours of 8:30 a.m. and 5:00 p.m. Voicemails left after 4:30 p.m. will not be returned until the following business day. _______________________________________________________________  For prescription refill requests, have your pharmacy contact our office. _______________________________________________________________  Recommendations made by the consultant and any test results will be sent to your referring physician. _______________________________________________________________ 

## 2018-06-07 NOTE — Progress Notes (Signed)
[place] presented for Portacath access and flush. Portacath located left chest wall accessed with  H 20 needle. No blood return and no resistance met when flushed Portacath flushed with 47m NS and 500U/575mHeparin and needle removed intact. Procedure without incident. Patient tolerated procedure well.   Vitals stable and discharged home from clinic ambulatory. Follow up as scheduled.

## 2018-06-09 ENCOUNTER — Other Ambulatory Visit: Payer: Self-pay | Admitting: Family

## 2018-06-21 ENCOUNTER — Encounter: Payer: Medicare Other | Admitting: *Deleted

## 2018-07-03 ENCOUNTER — Other Ambulatory Visit: Payer: Self-pay | Admitting: Family

## 2018-07-03 DIAGNOSIS — E1142 Type 2 diabetes mellitus with diabetic polyneuropathy: Secondary | ICD-10-CM

## 2018-07-05 ENCOUNTER — Other Ambulatory Visit: Payer: Self-pay | Admitting: Family

## 2018-07-05 DIAGNOSIS — C3491 Malignant neoplasm of unspecified part of right bronchus or lung: Secondary | ICD-10-CM

## 2018-07-05 DIAGNOSIS — R0602 Shortness of breath: Secondary | ICD-10-CM

## 2018-07-06 ENCOUNTER — Encounter: Payer: Self-pay | Admitting: Family

## 2018-07-06 ENCOUNTER — Other Ambulatory Visit: Payer: Self-pay

## 2018-07-06 ENCOUNTER — Ambulatory Visit (INDEPENDENT_AMBULATORY_CARE_PROVIDER_SITE_OTHER): Payer: Medicare Other | Admitting: Family

## 2018-07-06 DIAGNOSIS — J431 Panlobular emphysema: Secondary | ICD-10-CM

## 2018-07-06 DIAGNOSIS — E039 Hypothyroidism, unspecified: Secondary | ICD-10-CM

## 2018-07-06 DIAGNOSIS — E785 Hyperlipidemia, unspecified: Secondary | ICD-10-CM | POA: Diagnosis not present

## 2018-07-06 DIAGNOSIS — I509 Heart failure, unspecified: Secondary | ICD-10-CM | POA: Diagnosis not present

## 2018-07-06 DIAGNOSIS — E1142 Type 2 diabetes mellitus with diabetic polyneuropathy: Secondary | ICD-10-CM

## 2018-07-06 DIAGNOSIS — E1169 Type 2 diabetes mellitus with other specified complication: Secondary | ICD-10-CM

## 2018-07-06 DIAGNOSIS — I10 Essential (primary) hypertension: Secondary | ICD-10-CM

## 2018-07-06 DIAGNOSIS — C3491 Malignant neoplasm of unspecified part of right bronchus or lung: Secondary | ICD-10-CM | POA: Diagnosis not present

## 2018-07-06 DIAGNOSIS — R0602 Shortness of breath: Secondary | ICD-10-CM

## 2018-07-06 DIAGNOSIS — I2510 Atherosclerotic heart disease of native coronary artery without angina pectoris: Secondary | ICD-10-CM | POA: Diagnosis not present

## 2018-07-06 MED ORDER — DUTASTERIDE 0.5 MG PO CAPS: 0.5000 mg | ORAL_CAPSULE | Freq: Every day | ORAL | 1 refills | Status: DC

## 2018-07-06 MED ORDER — LINAGLIPTIN 5 MG PO TABS: 5.0000 mg | ORAL_TABLET | Freq: Every day | ORAL | 3 refills | Status: DC

## 2018-07-06 MED ORDER — GABAPENTIN 300 MG PO CAPS: ORAL_CAPSULE | ORAL | 0 refills | Status: DC

## 2018-07-06 MED ORDER — UMECLIDINIUM BROMIDE 62.5 MCG/INH IN AEPB: 1.0000 | INHALATION_SPRAY | Freq: Every day | RESPIRATORY_TRACT | 1 refills | Status: DC

## 2018-07-06 MED ORDER — ALBUTEROL SULFATE HFA 108 (90 BASE) MCG/ACT IN AERS: INHALATION_SPRAY | RESPIRATORY_TRACT | 3 refills | Status: DC

## 2018-07-06 NOTE — Progress Notes (Signed)
Virtual Visit via telephone Note  I connected with John [name] on 07/06/18 at 08:18 AM by telephone and verified that I am speaking with the correct person using two identifiers. John [name] is currently located at home and no one is currently with her during visit. The provider, Evelina Dun, FNP is located in their office at time of visit.  I discussed the limitations, risks, security and privacy concerns of performing an evaluation and management service by telephone and the availability of in person appointments. I also discussed with the patient that there may be a patient responsible charge related to this service. The patient expressed understanding and agreed to proceed.   History and Present Illness:  Pt presents to the office today for a virtual visit for chronic care follow up. He is followed by Cardiologists every 6 months for CHF. Followed by Oncologists every 6 months for non-small lung cancer. Diabetes  He presents for his follow-up diabetic visit. He has type 2 diabetes mellitus. His disease course has been stable. There are no hypoglycemic associated symptoms. Associated symptoms include fatigue. Pertinent negatives for diabetes include no blurred vision, no foot paresthesias and no visual change. There are no hypoglycemic complications. Symptoms are stable. Diabetic complications include heart disease. Risk factors for coronary artery disease include dyslipidemia, diabetes mellitus, male sex, hypertension and sedentary lifestyle. He is following a generally unhealthy diet. (Does not check BS at home ) Eye exam is current.  Hypertension  This is a chronic problem. The current episode started more than 1 year ago. The problem is controlled. Associated symptoms include malaise/fatigue, peripheral edema and shortness of breath. Pertinent negatives include no blurred vision. The current treatment provides moderate improvement. Hypertensive end-organ damage includes heart  failure. Identifiable causes of hypertension include a thyroid problem.  Congestive Heart Failure  Presents for follow-up visit. Associated symptoms include fatigue and shortness of breath. The symptoms have been stable.  Hyperlipidemia  This is a chronic problem. The current episode started more than 1 year ago. The problem is controlled. Recent lipid tests were reviewed and are normal. Associated symptoms include shortness of breath. Current antihyperlipidemic treatment includes statins. The current treatment provides moderate improvement of lipids.  Thyroid Problem  Presents for follow-up visit. Symptoms include fatigue. Patient reports no constipation, diarrhea or visual change. The symptoms have been stable. His past medical history is significant for heart failure and hyperlipidemia.  COPD PT continues to smoke 1/2 pack a day. Takes Incruse daily.   Diabetic Neuropathy PT complaining of burning aching pain in bilateral feet. States his gabapentin helps with this.    Review of Systems  Constitutional: Positive for fatigue and malaise/fatigue.  Eyes: Negative for blurred vision.  Respiratory: Positive for shortness of breath.   Gastrointestinal: Negative for constipation and diarrhea.     Observations/Objective: No SOB or distress noted  Assessment and Plan: 1. SOB (shortness of breath) on exertion - albuterol (PROVENTIL HFA;VENTOLIN HFA) 108 (90 Base) MCG/ACT inhaler; USE 2 PUFF BY MOUTH EVERY 6 HOURS AS NEEDED FOR SHORTNESS OF BREATH OR WHEEZING  Dispense: 18 Inhaler; Refill: 3  2. Non-small cell lung cancer, right (HCC) - albuterol (PROVENTIL HFA;VENTOLIN HFA) 108 (90 Base) MCG/ACT inhaler; USE 2 PUFF BY MOUTH EVERY 6 HOURS AS NEEDED FOR SHORTNESS OF BREATH OR WHEEZING  Dispense: 18 Inhaler; Refill: 3  3. Panlobular emphysema (HCC) - umeclidinium bromide (INCRUSE ELLIPTA) 62.5 MCG/INH AEPB; Inhale 1 puff into the lungs daily.  Dispense: 90 each; Refill: 1  4. Congestive  heart  failure, unspecified HF chronicity, unspecified heart failure type ([place])  5. Essential hypertension  6. Hyperlipidemia associated with type 2 diabetes mellitus ([place])  7. Hypothyroidism, unspecified type  8. Diabetic polyneuropathy associated with type 2 diabetes mellitus (HCC) - gabapentin (NEURONTIN) 300 MG capsule; Take 1 capsule in Am and 2 at night  Dispense: 270 capsule; Refill: 0  We will plan to see each other in 3 months for lab work and follow up Continue low carb diet and exercise Continue medications  Keep follow up appt with Specialists      I discussed the assessment and treatment plan with the patient. The patient was provided an opportunity to ask questions and all were answered. The patient agreed with the plan and demonstrated an understanding of the instructions.   The patient was advised to call back or seek an in-person evaluation if the symptoms worsen or if the condition fails to improve as anticipated.  The above assessment and management plan was discussed with the patient. The patient verbalized understanding of and has agreed to the management plan. Patient is aware to call the clinic if symptoms persist or worsen. Patient is aware when to return to the clinic for a follow-up visit. Patient educated on when it is appropriate to go to the emergency department.    Call ended 8:37AM, I  provided 19 minutes of non-face-to-face time during this encounter.    Evelina Dun, FNP

## 2018-07-10 ENCOUNTER — Other Ambulatory Visit: Payer: Self-pay | Admitting: Family

## 2018-07-10 DIAGNOSIS — L500 Allergic urticaria: Secondary | ICD-10-CM | POA: Diagnosis not present

## 2018-07-10 DIAGNOSIS — Z91014 Allergy to mammalian meats: Secondary | ICD-10-CM

## 2018-07-10 DIAGNOSIS — Z91018 Allergy to other foods: Secondary | ICD-10-CM

## 2018-07-10 DIAGNOSIS — L509 Urticaria, unspecified: Secondary | ICD-10-CM

## 2018-07-10 MED ORDER — PREDNISONE 10 MG (21) PO TBPK: ORAL_TABLET | ORAL | 0 refills | Status: DC

## 2018-07-10 NOTE — Progress Notes (Signed)
   Virtual Visit via telephone Note  I connected with John [name] on 07/10/18 at 10:49 AM by telephone and verified that I am speaking with the correct person using two identifiers. John [name] is currently located at home and wife is currently with her during visit. The provider, Evelina Dun, FNP is located in their office at time of visit.  I discussed the limitations, risks, security and privacy concerns of performing an evaluation and management service by telephone and the availability of in person appointments. I also discussed with the patient that there may be a patient responsible charge related to this service. The patient expressed understanding and agreed to proceed.   History and Present Illness:  Pt calls with complaints of hives. States he has been diagnosed with alpha gal in the past, but it was several years ago. He thought it would be ok for him to eat a hamburger last night. States he woke up with hives on bilateral hips, legs, and neck. He has taken benadryl with mild relief.  Urticaria  This is a new problem. The current episode started today. The problem has been gradually worsening since onset. The affected locations include the groin, left upper leg, left lower leg, neck, right lower leg and right upper leg. The rash is characterized by itchiness. Associated with: hamburger. Pertinent negatives include no anorexia, congestion, diarrhea, eye pain, facial edema, fever, joint pain, rhinorrhea, shortness of breath or sore throat. Past treatments include antihistamine. The treatment provided mild relief.      Review of Systems  Constitutional: Negative for fever.  HENT: Negative for congestion, rhinorrhea and sore throat.   Eyes: Negative for pain.  Respiratory: Negative for shortness of breath.   Gastrointestinal: Negative for anorexia and diarrhea.  Musculoskeletal: Negative for joint pain.    Observations/Objective: No SOB or distress noted  Assessment and  Plan: 1. Hives - predniSONE (STERAPRED UNI-PAK 21 TAB) 10 MG (21) TBPK tablet; Use as directed  Dispense: 21 tablet; Refill: 0  2. Allergy to beef - predniSONE (STERAPRED UNI-PAK 21 TAB) 10 MG (21) TBPK tablet; Use as directed  Dispense: 21 tablet; Refill: 0  3. Allergy to alpha-gal - predniSONE (STERAPRED UNI-PAK 21 TAB) 10 MG (21) TBPK tablet; Use as directed  Dispense: 21 tablet; Refill: 0  Do not scratch Avoid all beef, pork and lamb Cool baths  Strict low carb diet while taking prednisone Continue benadryl If any SOB or facial swelling go to ED Call if symptoms worsen or do not improve    I discussed the assessment and treatment plan with the patient. The patient was provided an opportunity to ask questions and all were answered. The patient agreed with the plan and demonstrated an understanding of the instructions.   The patient was advised to call back or seek an in-person evaluation if the symptoms worsen or if the condition fails to improve as anticipated.  The above assessment and management plan was discussed with the patient. The patient verbalized understanding of and has agreed to the management plan. Patient is aware to call the clinic if symptoms persist or worsen. Patient is aware when to return to the clinic for a follow-up visit. Patient educated on when it is appropriate to go to the emergency department.    Call ended 10:58 am, I provided 9 minutes of non-face-to-face time during this encounter.    Evelina Dun, FNP

## 2018-08-02 ENCOUNTER — Other Ambulatory Visit: Payer: Self-pay

## 2018-08-02 ENCOUNTER — Inpatient Hospital Stay (HOSPITAL_COMMUNITY): Payer: Medicare Other | Attending: Hematology

## 2018-08-02 DIAGNOSIS — Z79890 Hormone replacement therapy (postmenopausal): Secondary | ICD-10-CM | POA: Insufficient documentation

## 2018-08-02 DIAGNOSIS — G4730 Sleep apnea, unspecified: Secondary | ICD-10-CM | POA: Insufficient documentation

## 2018-08-02 DIAGNOSIS — J449 Chronic obstructive pulmonary disease, unspecified: Secondary | ICD-10-CM | POA: Diagnosis not present

## 2018-08-02 DIAGNOSIS — Z8249 Family history of ischemic heart disease and other diseases of the circulatory system: Secondary | ICD-10-CM | POA: Insufficient documentation

## 2018-08-02 DIAGNOSIS — C3491 Malignant neoplasm of unspecified part of right bronchus or lung: Secondary | ICD-10-CM | POA: Insufficient documentation

## 2018-08-02 DIAGNOSIS — Z7984 Long term (current) use of oral hypoglycemic drugs: Secondary | ICD-10-CM | POA: Diagnosis not present

## 2018-08-02 DIAGNOSIS — I110 Hypertensive heart disease with heart failure: Secondary | ICD-10-CM | POA: Diagnosis not present

## 2018-08-02 DIAGNOSIS — Z7952 Long term (current) use of systemic steroids: Secondary | ICD-10-CM | POA: Insufficient documentation

## 2018-08-02 DIAGNOSIS — E119 Type 2 diabetes mellitus without complications: Secondary | ICD-10-CM | POA: Insufficient documentation

## 2018-08-02 DIAGNOSIS — Z7982 Long term (current) use of aspirin: Secondary | ICD-10-CM | POA: Insufficient documentation

## 2018-08-02 DIAGNOSIS — Z79899 Other long term (current) drug therapy: Secondary | ICD-10-CM | POA: Insufficient documentation

## 2018-08-02 DIAGNOSIS — Z791 Long term (current) use of non-steroidal anti-inflammatories (NSAID): Secondary | ICD-10-CM | POA: Diagnosis not present

## 2018-08-02 DIAGNOSIS — F17210 Nicotine dependence, cigarettes, uncomplicated: Secondary | ICD-10-CM | POA: Diagnosis not present

## 2018-08-02 DIAGNOSIS — I509 Heart failure, unspecified: Secondary | ICD-10-CM | POA: Diagnosis not present

## 2018-08-02 DIAGNOSIS — E785 Hyperlipidemia, unspecified: Secondary | ICD-10-CM | POA: Insufficient documentation

## 2018-08-02 LAB — CBC WITH DIFFERENTIAL/PLATELET
Abs Immature Granulocytes: 0.01 10*3/uL (ref 0.00–0.07)
Basophils Absolute: 0 10*3/uL (ref 0.0–0.1)
Basophils Relative: 1 %
Eosinophils Absolute: 0.2 10*3/uL (ref 0.0–0.5)
Eosinophils Relative: 3 %
HCT: 41.8 % (ref 39.0–52.0)
Hemoglobin: 13.8 g/dL (ref 13.0–17.0)
Immature Granulocytes: 0 %
Lymphocytes Relative: 12 %
Lymphs Abs: 0.8 10*3/uL (ref 0.7–4.0)
MCH: 32.5 pg (ref 26.0–34.0)
MCHC: 33 g/dL (ref 30.0–36.0)
MCV: 98.6 fL (ref 80.0–100.0)
Monocytes Absolute: 0.8 10*3/uL (ref 0.1–1.0)
Monocytes Relative: 12 %
Neutro Abs: 4.7 10*3/uL (ref 1.7–7.7)
Neutrophils Relative %: 72 %
Platelets: 252 10*3/uL (ref 150–400)
RBC: 4.24 MIL/uL (ref 4.22–5.81)
RDW: 14.5 % (ref 11.5–15.5)
WBC: 6.4 10*3/uL (ref 4.0–10.5)
nRBC: 0 % (ref 0.0–0.2)

## 2018-08-02 LAB — COMPREHENSIVE METABOLIC PANEL
ALT: 18 U/L (ref 0–44)
AST: 19 U/L (ref 15–41)
Albumin: 3.9 g/dL (ref 3.5–5.0)
Alkaline Phosphatase: 73 U/L (ref 38–126)
Anion gap: 12 (ref 5–15)
BUN: 17 mg/dL (ref 8–23)
CO2: 25 mmol/L (ref 22–32)
Calcium: 9.4 mg/dL (ref 8.9–10.3)
Chloride: 99 mmol/L (ref 98–111)
Creatinine, Ser: 0.93 mg/dL (ref 0.61–1.24)
GFR calc Af Amer: >60 mL/min (ref >60)
GFR calc non Af Amer: >60 mL/min (ref >60)
Glucose, Bld: 173 mg/dL — ABNORMAL HIGH (ref 70–99)
Potassium: 4.1 mmol/L (ref 3.5–5.1)
Sodium: 136 mmol/L (ref 135–145)
Total Bilirubin: 0.5 mg/dL (ref 0.3–1.2)
Total Protein: 6.9 g/dL (ref 6.5–8.1)

## 2018-08-02 LAB — TSH: TSH: 3.104 u[IU]/mL (ref 0.350–4.500)

## 2018-08-02 LAB — LACTATE DEHYDROGENASE: LDH: 133 U/L (ref 98–192)

## 2018-08-09 ENCOUNTER — Other Ambulatory Visit: Payer: Self-pay

## 2018-08-09 ENCOUNTER — Inpatient Hospital Stay (HOSPITAL_COMMUNITY): Payer: Medicare Other | Attending: Hematology | Admitting: Hematology

## 2018-08-09 ENCOUNTER — Inpatient Hospital Stay (HOSPITAL_COMMUNITY): Payer: Medicare Other | Attending: Hematology

## 2018-08-09 ENCOUNTER — Encounter (HOSPITAL_COMMUNITY): Payer: Self-pay | Admitting: Hematology

## 2018-08-09 DIAGNOSIS — Z85118 Personal history of other malignant neoplasm of bronchus and lung: Secondary | ICD-10-CM | POA: Insufficient documentation

## 2018-08-09 DIAGNOSIS — C3491 Malignant neoplasm of unspecified part of right bronchus or lung: Secondary | ICD-10-CM | POA: Insufficient documentation

## 2018-08-09 DIAGNOSIS — Z452 Encounter for adjustment and management of vascular access device: Secondary | ICD-10-CM | POA: Insufficient documentation

## 2018-08-09 DIAGNOSIS — E039 Hypothyroidism, unspecified: Secondary | ICD-10-CM | POA: Insufficient documentation

## 2018-08-09 MED ORDER — HEPARIN SOD (PORK) LOCK FLUSH 100 UNIT/ML IV SOLN
500.0000 [IU] | Freq: Once | INTRAVENOUS | Status: AC
  Administered 2018-08-09: 500 [IU] via INTRAVENOUS

## 2018-08-09 MED ORDER — SODIUM CHLORIDE 0.9% FLUSH
10.0000 mL | Freq: Once | INTRAVENOUS | Status: AC
  Administered 2018-08-09: 10 mL via INTRAVENOUS

## 2018-08-09 NOTE — Progress Notes (Signed)
[facility] [place], [place] [address]   CLINIC:  Medical Oncology/Hematology  PCP:  [name], [place] [address] [phone]   REASON FOR VISIT:  Follow-up for NSCLC  CURRENT THERAPY: Clinical Surveillance   BRIEF ONCOLOGIC HISTORY:    Non-small cell lung cancer, right ([place])   04/17/2016 Imaging    CT chest- 1. Examination is positive for bilateral upper lobe pulmonary lesions suspicious for malignancy. Further investigation with PET-CT and tissue sampling is advised. 2. Borderline enlarged paratracheal lymph nodes and right hilar nodes. Cannot rule out metastatic adenopathy. Attention to these areas on PET-CT is advised. 3. Emphysema 4. Aortic atherosclerosis and multi vessel coronary artery calcification.    04/18/2016 Initial Diagnosis    Adenocarcinoma of lung, right ([place])    04/24/2016 PET scan    1. Adjacent hypermetabolic nodules in the RIGHT upper lobe consistent bronchogenic carcinoma. 2. Suspicion of ipsilateral hilar and paratracheal nodal metastasis. 3. Nodule in the LEFT upper lobe with suspicious morphology has a relatively low metabolic activity for size. Favor synchronous primary bronchogenic carcinoma. 4. No metabolic activity associated with a small LEFT lower lobe pulmonary nodule. Recommend attention on follow-up.    05/06/2016 Imaging    MRI brain- Negative for metastatic disease.    05/28/2016 Procedure    1. Electromagnetic navigational bronchoscopy with brushings, needle     aspirations and biopsies of bilateral upper lobe nodules. 2. Endobronchial ultrasound with mediastinal and hilar lymph node     aspirations. By Dr. [name]    06/03/2016 Pathology Results    Diagnosis 1. Lung, biopsy, Right upper lobe - BLOOD AND BENIGN BRONCHIAL AND LUNG TISSUE. 2. Lung, biopsy, Left upper lobe - BLOOD AND BENIGN BRONCHIAL AND LUNG TISSUE.    06/03/2016 Pathology Results   Diagnosis TRANSBRONCHIAL NEEDLE ASPIRATION, NAVIGATION, RUL (SPECIMEN 5 OF 7, COLLECTED 05/28/16): MALIGNANT CELLS CONSISTENT WITH NON-SMALL CELL CARCINOMA Comment There are limited tumor cells in the cell block (insufficient for molecular testing). TTF-1 is positive in a few of the atypical cells. They appear negative for synaptophysin and cytokeratin 5/6. Overall there is limited tumor, but a lung adenocarcinoma is slightly favored. Dr. [name] has reviewed the case. The case was called to Dr. [name] on 06/03/2016.    06/13/2016 Procedure    Port placed by Dr. Arnoldo Morale    06/25/2016 - 08/15/2016 Chemotherapy    The patient had palonosetron (ALOXI) injection 0.25 mg, 0.25 mg, Intravenous,  Once, 6 of 6 cycles  CARBOplatin (PARAPLATIN) 220 mg in sodium chloride 0.9 % 250 mL chemo infusion, 220 mg (100 % of original dose 215.8 mg), Intravenous,  Once, 6 of 6 cycles Dose modification:   (original dose 215.8 mg, Cycle 1),   (original dose 215.8 mg, Cycle 5),   (original dose 215.8 mg, Cycle 6),   (original dose 215.8 mg, Cycle 2),   (original dose 215.8 mg, Cycle 3),   (original dose 215.8 mg, Cycle 4)  PACLitaxel (TAXOL) 90 mg in dextrose 5 % 250 mL chemo infusion ( for chemotherapy treatment.      06/25/2016 - 08/20/2016 Radiation Therapy    Radiation in [place], [place] by Dr. Quitman Livings.  R lung to 6600 cGy    07/09/2016 - 07/10/2016 Hospital Admission    Admit date: 07/09/2016  Admission diagnosis: Fever and chills Additional comments: Negative work-up, discharged with course of Levaquin    07/09/2016 Treatment Plan Change    Treatment deferred x 1 week due  to hospitalization     08/05/2016 - 08/06/2016 Hospital Admission    Admit date: 08/05/2016 Admission diagnosis: Joint pain Additional comments: Treated with steroids with symptomatic improvement    08/25/2016 Imaging    MRI brain- Negative for intracranial metastasis on this motion degraded examination.  Stable mild-to-moderate chronic small  vessel ischemic disease.    09/18/2016 PET scan    1. Partial metabolic response. Right upper lobe 2.6 cm hypermetabolic pulmonary nodule, decreased in size and metabolism. Mildly hypermetabolic right hilar nodal metastasis, decreased in metabolism. Right paratracheal lymph node is no longer hypermetabolic. No new or progressive metastatic disease. 2. Apical left upper lobe 0.9 cm solid pulmonary nodule, slightly decreased in size, with no significant metabolism. The change in size could indicate a neoplastic etiology for this nodule. 3. Additional subcentimeter pulmonary nodules in the left lung are stable and below PET resolution . 4. Additional findings include aortic atherosclerosis, coronary atherosclerosis, moderate emphysema, stable small pericardial effusion/thickening and mild sigmoid diverticulosis.    01/13/2017 Imaging    CT chest with contrast IMPRESSION: 1. No change in size of the hypermetabolic nodular thickening in the RIGHT upper lobe. 2. LEFT upper lobe spiculated nodule is stable. 3. Rounded LEFT lower lobe pulmonary nodule is stable. 4. No evidence of lung cancer progression.    05/08/2017 Adverse Reaction    Development of Hypothyroidism- likely secondary to immunotherapy.  Levothyroxine therapy initiated.       CANCER STAGING: Cancer Staging Non-small cell lung cancer, right [facility]) Staging form: Lung, AJCC 8th Edition - Clinical stage from 06/05/2016: Stage IIIA (cT1b(2), cN2, cM0) - Signed by Baird Cancer, PA-C on 08/21/2016    INTERVAL HISTORY:  Mr. Mancinas 73 y.o. male returns for routine follow-up of NSCLC. He is currently on clinical surveillance.  He reports overall doing well. He denies any significant fatigue. Denies any new cough. Denies any CP or SOB. Appetite is stable. Denies any significant weight loss or weight gain. He continues follow up with his pulmonologist regarding COPD.     REVIEW OF SYSTEMS:  Review of Systems  Constitutional:  Negative.   HENT:  Negative.   Eyes: Negative.   Respiratory: Negative.   Cardiovascular: Negative.   Gastrointestinal: Negative.   Endocrine: Negative.   Genitourinary: Negative.    Musculoskeletal: Negative.   Skin: Negative.   Neurological: Negative.   Hematological: Negative.   Psychiatric/Behavioral: Negative.      PAST MEDICAL/SURGICAL HISTORY:  Past Medical History:  Diagnosis Date   Adenocarcinoma of lung, right ([place]) 04/18/2016   Anxiety    Arthritis    Asthma    CHF (congestive heart failure) (HCC)    COPD (chronic obstructive pulmonary disease) (HCC)    Depression    Diabetes mellitus without complication (HCC)    no meds   DM (diabetes mellitus) ([place]) 07/09/2016   Dyspnea    History of kidney stones    Hyperlipidemia    Hypertension    Macular degeneration    Neuropathy    Non-small cell lung cancer, right ([place]) 04/18/2016   Prostatitis    Pulmonary nodule, left 07/16/2016   Sleep apnea    cpap   Past Surgical History:  Procedure Laterality Date   CATARACT EXTRACTION, BILATERAL Bilateral    NO PAST SURGERIES     PORTACATH PLACEMENT Left 06/13/2016   Procedure: INSERTION PORT-A-CATH;  Surgeon: Aviva Signs, MD;  Location: [facility];  Service: General;  Laterality: Left;   VIDEO BRONCHOSCOPY WITH ENDOBRONCHIAL NAVIGATION N/A 05/28/2016  Procedure: VIDEO BRONCHOSCOPY WITH ENDOBRONCHIAL NAVIGATION;  Surgeon: [name], MD;  Location: [facility];  Service: Thoracic;  Laterality: N/A;   VIDEO BRONCHOSCOPY WITH ENDOBRONCHIAL ULTRASOUND N/A 05/28/2016   Procedure: VIDEO BRONCHOSCOPY WITH ENDOBRONCHIAL ULTRASOUND;  Surgeon: [name], MD;  Location: [facility];  Service: Thoracic;  Laterality: N/A;     SOCIAL HISTORY:  Social History   Socioeconomic History   Marital status: Divorced    Spouse name: Not on file   Number of children: 5   Years of education: 10   Highest education level: 10th grade  Occupational History    Occupation: Retired  Scientist, product/process development strain: Not very hard   Food insecurity:    Worry: Never true    Inability: Never true   Transportation needs:    Medical: No    Non-medical: No  Tobacco Use   Smoking status: Current Every Day Smoker    Packs/day: 0.10    Years: 55.00    Pack years: 5.50    Types: Cigarettes    Start date: 03/11/1961   Smokeless tobacco: Never Used   Tobacco comment: smoking 1 to 2 cigarettes per day  Substance and Sexual Activity   Alcohol use: No   Drug use: No   Sexual activity: Yes    Birth control/protection: None  Lifestyle   Physical activity:    Days per week: 0 days    Minutes per session: 0 min   Stress: To some extent  Relationships   Social connections:    Talks on phone: More than three times a week    Gets together: More than three times a week    Attends religious service: Never    Active member of club or organization: No    Attends meetings of clubs or organizations: Never    Relationship status: Divorced   Intimate partner violence:    Fear of current or ex partner: Not on file    Emotionally abused: Not on file    Physically abused: Not on file    Forced sexual activity: Not on file  Other Topics Concern   Not on file  Social History Narrative   Not on file    FAMILY HISTORY:  Family History  Problem Relation Age of Onset   Hypertension Mother    Diabetes Father    Heart disease Father    Stroke Father    Hypertension Sister     CURRENT MEDICATIONS:  Outpatient Encounter Medications as of 08/09/2018  Medication Sig Note   albuterol (PROVENTIL HFA;VENTOLIN HFA) 108 (90 Base) MCG/ACT inhaler USE 2 PUFF BY MOUTH EVERY 6 HOURS AS NEEDED FOR SHORTNESS OF BREATH OR WHEEZING    aspirin EC 81 MG tablet Take 81 mg by mouth daily.    cloNIDine (CATAPRES) 0.1 MG tablet TAKE 1 TABLET (0.1 MG TOTAL) BY MOUTH 3 (THREE) TIMES DAILY.    dutasteride (AVODART) 0.5 MG capsule Take 1 capsule (0.5  mg total) by mouth daily.    fluticasone (FLONASE) 50 MCG/ACT nasal spray Place 2 sprays into both nostrils daily. 12/05/2016: As needed   furosemide (LASIX) 20 MG tablet Take 1 tablet (20 mg total) by mouth daily. 08/09/2018: Pt takes medication as needed   gabapentin (NEURONTIN) 300 MG capsule Take 1 capsule in Am and 2 at night    levothyroxine (SYNTHROID) 75 MCG tablet Take 1 tablet (75 mcg total) by mouth daily.    linagliptin (TRADJENTA) 5 MG TABS tablet Take 1  tablet (5 mg total) by mouth daily.    lisinopril-hydrochlorothiazide (PRINZIDE,ZESTORETIC) 20-25 MG tablet TAKE 1 TABLET BY MOUTH EVERY DAY    meloxicam (MOBIC) 7.5 MG tablet Take 1 tablet (7.5 mg total) by mouth daily as needed for pain (back pain).    metFORMIN (GLUCOPHAGE XR) 500 MG 24 hr tablet Take 1 tablet (500 mg total) by mouth daily with breakfast.    pravastatin (PRAVACHOL) 40 MG tablet Take 1 tablet (40 mg total) by mouth every 3 (three) days. M-W-F    prochlorperazine (COMPAZINE) 10 MG tablet Take 1 tablet (10 mg total) by mouth every 6 (six) hours as needed for nausea or vomiting.    tamsulosin (FLOMAX) 0.4 MG CAPS capsule TAKE 2 CAPSULES (0.8 MG TOTAL) BY MOUTH DAILY.    traZODone (DESYREL) 50 MG tablet Take 2 tablets (100 mg total) by mouth at bedtime as needed for sleep.    umeclidinium bromide (INCRUSE ELLIPTA) 62.5 MCG/INH AEPB Inhale 1 puff into the lungs daily.    JARDIANCE 10 MG TABS tablet TAKE 1 TABLET BY MOUTH ONCE DAILY (Patient not taking: Reported on 08/09/2018)    [DISCONTINUED] predniSONE (STERAPRED UNI-PAK 21 TAB) 10 MG (21) TBPK tablet Use as directed    [EXPIRED] heparin lock flush 100 unit/mL     [EXPIRED] sodium chloride flush (NS) 0.9 % injection 10 mL     No facility-administered encounter medications on file as of 08/09/2018.     ALLERGIES:  No Known Allergies   PHYSICAL EXAM:  ECOG Performance status: 1  Vitals:   08/09/18 1034  BP: (!) 148/85  Pulse: (!) 106  Resp: 18    Temp: 98.1 F (36.7 C)  SpO2: 97%   Filed Weights   08/09/18 1034  Weight: 182 lb 6.4 oz (82.7 kg)    Physical Exam Vitals signs reviewed.  Constitutional:      Appearance: Normal appearance.  Cardiovascular:     Rate and Rhythm: Normal rate and regular rhythm.     Heart sounds: Normal heart sounds.  Pulmonary:     Effort: Pulmonary effort is normal.     Breath sounds: Normal breath sounds.  Abdominal:     General: There is no distension.     Palpations: Abdomen is soft. There is no mass.  Musculoskeletal:        General: No swelling.  Skin:    General: Skin is warm.  Neurological:     General: No focal deficit present.     Mental Status: He is alert and oriented to person, place, and time.  Psychiatric:        Mood and Affect: Mood normal.        Behavior: Behavior normal.      LABORATORY DATA:  I have reviewed the labs as listed.  CBC    Component Value Date/Time   WBC 6.4 08/02/2018 1052   RBC 4.24 08/02/2018 1052   HGB 13.8 08/02/2018 1052   HGB 13.2 03/22/2018 1128   HCT 41.8 08/02/2018 1052   HCT 40.1 03/22/2018 1128   PLT 252 08/02/2018 1052   PLT 355 03/22/2018 1128   MCV 98.6 08/02/2018 1052   MCV 91 03/22/2018 1128   MCH 32.5 08/02/2018 1052   MCHC 33.0 08/02/2018 1052   RDW 14.5 08/02/2018 1052   RDW 15.0 03/22/2018 1128   LYMPHSABS 0.8 08/02/2018 1052   LYMPHSABS 0.8 03/22/2018 1128   MONOABS 0.8 08/02/2018 1052   EOSABS 0.2 08/02/2018 1052   EOSABS 0.2 03/22/2018 1128  BASOSABS 0.0 08/02/2018 1052   BASOSABS 0.1 03/22/2018 1128   CMP Latest Ref Rng & Units 08/02/2018 03/22/2018 03/08/2018  Glucose 70 - 99 mg/dL 173(H) 152(H) 176(H)  BUN 8 - 23 mg/dL 17 16 19   Creatinine 0.61 - 1.24 mg/dL 0.93 0.87 0.98  Sodium 135 - 145 mmol/L 136 139 136  Potassium 3.5 - 5.1 mmol/L 4.1 4.1 3.9  Chloride 98 - 111 mmol/L 99 97 100  CO2 22 - 32 mmol/L 25 21 29   Calcium 8.9 - 10.3 mg/dL 9.4 9.1 8.7(L)  Total Protein 6.5 - 8.1 g/dL 6.9 6.9 7.4  Total  Bilirubin 0.3 - 1.2 mg/dL 0.5 <0.2 0.6  Alkaline Phos 38 - 126 U/L 73 93 72  AST 15 - 41 U/L 19 15 14(L)  ALT 0 - 44 U/L 18 12 12        DIAGNOSTIC IMAGING:  I have independently reviewed the scans and discussed with the patient.   I have reviewed Derek Jack, NP's note and agree with the documentation.  I personally performed a face-to-face visit, made revisions and my assessment and plan is as follows.    ASSESSMENT & PLAN:   Non-small cell lung cancer, right (HCC) 1.Stage IIIA (cT1BcN2cM0) NSCLC, favoring adenocarcinoma: -Scan at diagnosis showed 2 right upper lobe pulmonary nodules measuring 1.7 and 2.0 cm, ipsilateral mediastinal adenopathy, status post bronchoscopy on 05/28/2016, left upper lobe pulmonary nodule not biopsy proven but suspicious with imaging. -Chemoradiation therapy with weekly carboplatin and paclitaxel completed on 08/20/2016.  -Consolidation durvalumab from 09/26/2016 through 09/25/2017.    -CT chest on 07/14/2017 shows previously noted pulmonary nodules stable.  Chronic postradiation changes in the right lung stable.  No definite evidence of local recurrence of disease.  Small amount of pericardial fluid/thickening as well as small amount of pericardial calcification.   - His baseline performance status is stable.  He does have some shortness of breath on exertion from COPD. - We reviewed his blood work.  Hemoglobin is 13.8.  I have recommended doing a CT of the chest with contrast. -I will see him back in 4 weeks for follow-up.  2.  Hypothyroidism: -Latest TSH is 3.  Patient reports that he has been taking Synthroid 50 mcg daily.  No intervention necessary.        Orders placed this encounter:  No orders of the defined types were placed in this encounter.     Derek Jack, MD [place] [phone]

## 2018-08-09 NOTE — Assessment & Plan Note (Signed)
1.Stage IIIA (cT1BcN2cM0) NSCLC, favoring adenocarcinoma: -Scan at diagnosis showed 2 right upper lobe pulmonary nodules measuring 1.7 and 2.0 cm, ipsilateral mediastinal adenopathy, status post bronchoscopy on 05/28/2016, left upper lobe pulmonary nodule not biopsy proven but suspicious with imaging. -Chemoradiation therapy with weekly carboplatin and paclitaxel completed on 08/20/2016.  -Consolidation durvalumab from 09/26/2016 through 09/25/2017.    -CT chest on 07/14/2017 shows previously noted pulmonary nodules stable.  Chronic postradiation changes in the right lung stable.  No definite evidence of local recurrence of disease.  Small amount of pericardial fluid/thickening as well as small amount of pericardial calcification.   - His baseline performance status is stable.  He does have some shortness of breath on exertion from COPD. - We reviewed his blood work.  Hemoglobin is 13.8.  I have recommended doing a CT of the chest with contrast. -I will see him back in 4 weeks for follow-up.  2.  Hypothyroidism: -Latest TSH is 3.  Patient reports that he has been taking Synthroid 50 mcg daily.  No intervention necessary.

## 2018-08-09 NOTE — Progress Notes (Signed)
[place] presented for Portacath access and flush.  Proper placement of portacath confirmed by CXR.  Portacath located left chest wall accessed with  H 20 needle.  No blood return but flushes easily with no swelling or pain. Portacath flushed with 44ml NS and 500U/46ml Heparin and needle removed intact.  Procedure tolerated well and without incident.

## 2018-08-09 NOTE — Patient Instructions (Signed)
[place] at [facility]  Discharge Instructions:  Port flushed today. _______________________________________________________________  Thank you for choosing [place] at [facility] to provide your oncology and hematology care.  To afford each patient quality time with our providers, please arrive at least 15 minutes before your scheduled appointment.  You need to re-schedule your appointment if you arrive 10 or more minutes late.  We strive to give you quality time with our providers, and arriving late affects you and other patients whose appointments are after yours.  Also, if you no show three or more times for appointments you may be dismissed from the clinic.  Again, thank you for choosing [place] at [place] hope is that these requests will allow you access to exceptional care and in a timely manner. _______________________________________________________________  If you have questions after your visit, please contact our office at [phone] between the hours of 8:30 a.m. and 5:00 p.m. Voicemails left after 4:30 p.m. will not be returned until the following business day. _______________________________________________________________  For prescription refill requests, have your pharmacy contact our office. _______________________________________________________________  Recommendations made by the consultant and any test results will be sent to your referring physician. _______________________________________________________________

## 2018-08-22 ENCOUNTER — Other Ambulatory Visit: Payer: Self-pay | Admitting: Family

## 2018-08-25 ENCOUNTER — Telehealth: Payer: Self-pay | Admitting: Neurology

## 2018-08-25 NOTE — Telephone Encounter (Signed)
Due to current COVID 19 pandemic, our office is severely reducing in office visits until further notice, in order to minimize the risk to our patients and healthcare providers.   Called patient and offered a virtual visit for his 5/27 appt. Patient has a COVID score of 8. Patient feels that a telephone visit would be best. I advised that he will receive 3 calls: one from RN to update chart, 1 from check in aprx. 30 minutes prior to appt, then finally doctor will call around appt time. Pt verbalized understanding.  Pt understands that although there may be some limitations with this type of visit, we will take all precautions to reduce any security or privacy concerns.  Pt understands that this will be treated like an in office visit and we will file with pt's insurance, and there may be a patient responsible charge related to this service.

## 2018-08-26 NOTE — Telephone Encounter (Signed)
I called pt that visit will be video due to COVID 19. PT stated his friend [name] has a Iphone and can receive text messages. I explain the doxy process to [name] and how to click link 10 minutes early before appt at 1130. [name] has [employer] as her cell phone carrier. I stated text will be sent the day of visit. [name] number is [phone].They both verbalized understanding.

## 2018-08-26 NOTE — Telephone Encounter (Signed)
I called pt to update his chart. No answer, left a message asking him to call me back.

## 2018-08-26 NOTE — Telephone Encounter (Signed)
Pt has returned the call to [employer], he is asking for a call back

## 2018-08-31 NOTE — Telephone Encounter (Signed)
Rn text link to patients friend [name] [place] at [phone]. Appt is with Dr.Athar tomorrow at 1130am.

## 2018-09-01 ENCOUNTER — Other Ambulatory Visit: Payer: Self-pay

## 2018-09-01 ENCOUNTER — Encounter: Payer: Self-pay | Admitting: Neurology

## 2018-09-01 ENCOUNTER — Ambulatory Visit (INDEPENDENT_AMBULATORY_CARE_PROVIDER_SITE_OTHER): Payer: Medicare Other | Admitting: Neurology

## 2018-09-01 DIAGNOSIS — Z9989 Dependence on other enabling machines and devices: Secondary | ICD-10-CM

## 2018-09-01 DIAGNOSIS — I2510 Atherosclerotic heart disease of native coronary artery without angina pectoris: Secondary | ICD-10-CM | POA: Diagnosis not present

## 2018-09-01 DIAGNOSIS — G4733 Obstructive sleep apnea (adult) (pediatric): Secondary | ICD-10-CM | POA: Diagnosis not present

## 2018-09-01 NOTE — Patient Instructions (Signed)
Given verbally, during today's virtual video-based encounter, with verbal feedback received.   

## 2018-09-01 NOTE — Progress Notes (Signed)
Interim history:  [name] is a 73 year old right-handed gentleman with an underlying medical history of lung cancer (SCLC with s/p chemo and radiation), hypertension, congestive heart failure, diabetes with associated neuropathy, and overweight state, who presents for a virtual, video based appointment via [email or web address] for follow-up consultation of his obstructive sleep apnea, on AutoPap therapy.  He is accompanied by his friend, and uses her cell phone for this appointment, I am located in my office.  I first met him on 02/08/2018 at the request of his primary care nurse practitioner, at which time he reported a prior diagnosis of obstructive sleep apnea.  He had an older CPAP machine and needed reevaluation.  He was on CPAP of 13 cm but not fully compliant.  He was advised to proceed with a sleep study.  He had a baseline sleep study on 02/26/2018 which showed a sleep efficiency of 80.4%, REM sleep was absent.  Total AHI was mildly elevated of borderline even at 5.9/h, average oxygen saturation was 95%, nadir was 82% during non-REM sleep.  He was advised to start AutoPap therapy.  He was seen in the interim by Cecille Rubin, nurse practitioner on 05/31/2018 at which time he was compliant with his AutoPap machine and apnea scores were adequate however, his leak from the mask was high, he was advised to seek a mask refit with the DME company and follow-up in 3 months.  Today, 09/01/2018: Please also see below for virtual visit documentation.  I reviewed his AutoPap compliance data from 07/27/2018 through 08/25/2018 which is a total of 30 days, during which time he used his machine every night with percent used days greater than 4 hours at 100%, indicating superb compliance with an average usage of 6 hours, average AHI 2.3/h, 95th percentile of pressure of 12.4 cm with a range of 7 cm to 15 cm with EPR, leak on the higher side with a 95th percentile at 26.9 L/min.   Previously:   02/08/2018: (He) was  previously diagnosed with obstructive sleep apnea and placed on CPAP therapy. Prior sleep study results are not available for my review today. He reports that sleep study testing was about 10 years ago. He has an older CPAP machine. I reviewed your office note from 01/12/2018. I reviewed his CPAP compliance data from 01/04/2018 through 02/02/2018 which is a total of 30 days, during which time he used his machine 29 days with percent used days greater than 4 hours at 56.7%, indicating suboptimal compliance with an average usage of 5 hours and 1 minute for days on treatment. Residual AHI 1.7 per hour, pressure of 13 cm with a ramp time of 10 minutes, starting at 5 cm. He has a C-Flex setting of 3. He has a Respironics REM star auto machine. His Epworth sleepiness score is 6 out of 24, fatigue score is 44 out of 63. He is a retired Administrator. He was followed by Dr. Ellin Goodie at [facility] neurology in [place] for his sleep apnea, last seen on 04/13/15. Chart review indicates that he was diagnosed with moderate to severe obstructive sleep apnea with an AHI of 17 per hour, REM AHI of 65 per hour, O2 nadir of 76%. He was documented to be compliant with his CPAP. His Epworth sleepiness score is 6 out of 24 today, fatigue score is 44 out of 63. He would like to get updated supplies a new equipment. He is divorced, lives with his friend, has a total of 5 children. He smokes about  ppd, has reduced. He does not utilize alcohol currently and drinks caffeine in the form of coffee, about 2 cups per day, and soda about 1 per day.  The mask is ill fitting and leaks is very high. He has been using a [facility], DME was [facility]. He sleeps some in the recliner. He wonders if the pressure settings the right. He is not aware of any family history of OSA. He denies restless leg symptoms. Bedtime is around 1 AM, rise time before 8 typically. He does not have night to night nocturia and denies morning headaches. He takes a children's Benadryl on  most nights of the week or maybe every other night on average. Sometimes he also takes Tylenol at night.   His Past Medical History Is Significant For: Past Medical History:  Diagnosis Date  . Adenocarcinoma of lung, right ([place]) 04/18/2016  . Anxiety   . Arthritis   . Asthma   . CHF (congestive heart failure) ([place])   . COPD (chronic obstructive pulmonary disease) ([place])   . Depression   . Diabetes mellitus without complication ([facility])    no meds  . DM (diabetes mellitus) ([place]) 07/09/2016  . Dyspnea   . History of kidney stones   . Hyperlipidemia   . Hypertension   . Macular degeneration   . Neuropathy   . Non-small cell lung cancer, right ([place]) 04/18/2016  . Prostatitis   . Pulmonary nodule, left 07/16/2016  . Sleep apnea    cpap    His Past Surgical History Is Significant For: Past Surgical History:  Procedure Laterality Date  . CATARACT EXTRACTION, BILATERAL Bilateral   . NO PAST SURGERIES    . PORTACATH PLACEMENT Left 06/13/2016   Procedure: INSERTION PORT-A-CATH;  Surgeon: Aviva Signs, MD;  Location: [facility];  Service: General;  Laterality: Left;  Marland Kitchen VIDEO BRONCHOSCOPY WITH ENDOBRONCHIAL NAVIGATION N/A 05/28/2016   Procedure: VIDEO BRONCHOSCOPY WITH ENDOBRONCHIAL NAVIGATION;  Surgeon: [name], MD;  Location: [facility];  Service: Thoracic;  Laterality: N/A;  . VIDEO BRONCHOSCOPY WITH ENDOBRONCHIAL ULTRASOUND N/A 05/28/2016   Procedure: VIDEO BRONCHOSCOPY WITH ENDOBRONCHIAL ULTRASOUND;  Surgeon: [name], MD;  Location: [facility];  Service: Thoracic;  Laterality: N/A;    His Family History Is Significant For: Family History  Problem Relation Age of Onset  . Hypertension Mother   . Diabetes Father   . Heart disease Father   . Stroke Father   . Hypertension Sister     His Social History Is Significant For: Social History   Socioeconomic History  . Marital status: Divorced    Spouse name: Not on file  . Number of children: 5  . Years of education: 10  .  Highest education level: 10th grade  Occupational History  . Occupation: Retired  Scientific laboratory technician  . Financial resource strain: Not very hard  . Food insecurity:    Worry: Never true    Inability: Never true  . Transportation needs:    Medical: No    Non-medical: No  Tobacco Use  . Smoking status: Current Every Day Smoker    Packs/day: 0.10    Years: 55.00    Pack years: 5.50    Types: Cigarettes    Start date: 03/11/1961  . Smokeless tobacco: Never Used  . Tobacco comment: smoking 1 to 2 cigarettes per day  Substance and Sexual Activity  . Alcohol use: No  . Drug use: No  . Sexual activity: Yes    Birth control/protection: None  Lifestyle  .  Physical activity:    Days per week: 0 days    Minutes per session: 0 min  . Stress: To some extent  Relationships  . Social connections:    Talks on phone: More than three times a week    Gets together: More than three times a week    Attends religious service: Never    Active member of club or organization: No    Attends meetings of clubs or organizations: Never    Relationship status: Divorced  Other Topics Concern  . Not on file  Social History Narrative  . Not on file    His Allergies Are:  No Known Allergies:   His Current Medications Are:  Outpatient Encounter Medications as of 09/01/2018  Medication Sig  . albuterol (PROVENTIL HFA;VENTOLIN HFA) 108 (90 Base) MCG/ACT inhaler USE 2 PUFF BY MOUTH EVERY 6 HOURS AS NEEDED FOR SHORTNESS OF BREATH OR WHEEZING  . aspirin EC 81 MG tablet Take 81 mg by mouth daily.  . cloNIDine (CATAPRES) 0.1 MG tablet TAKE 1 TABLET (0.1 MG TOTAL) BY MOUTH 3 (THREE) TIMES DAILY.  Marland Kitchen dutasteride (AVODART) 0.5 MG capsule Take 1 capsule (0.5 mg total) by mouth daily.  . fluticasone (FLONASE) 50 MCG/ACT nasal spray Place 2 sprays into both nostrils daily.  . furosemide (LASIX) 20 MG tablet Take 1 tablet (20 mg total) by mouth daily.  Marland Kitchen gabapentin (NEURONTIN) 300 MG capsule Take 1 capsule in Am and 2  at night  . JARDIANCE 10 MG TABS tablet TAKE 1 TABLET BY MOUTH ONCE DAILY (Patient not taking: Reported on 08/09/2018)  . levothyroxine (SYNTHROID) 75 MCG tablet Take 1 tablet (75 mcg total) by mouth daily.  Marland Kitchen linagliptin (TRADJENTA) 5 MG TABS tablet Take 1 tablet (5 mg total) by mouth daily.  Marland Kitchen lisinopril-hydrochlorothiazide (ZESTORETIC) 20-25 MG tablet TAKE 1 TABLET BY MOUTH EVERY DAY  . meloxicam (MOBIC) 7.5 MG tablet Take 1 tablet (7.5 mg total) by mouth daily as needed for pain (back pain).  . metFORMIN (GLUCOPHAGE XR) 500 MG 24 hr tablet Take 1 tablet (500 mg total) by mouth daily with breakfast.  . pravastatin (PRAVACHOL) 40 MG tablet Take 1 tablet (40 mg total) by mouth every 3 (three) days. M-W-F  . prochlorperazine (COMPAZINE) 10 MG tablet Take 1 tablet (10 mg total) by mouth every 6 (six) hours as needed for nausea or vomiting.  . tamsulosin (FLOMAX) 0.4 MG CAPS capsule TAKE 2 CAPSULES (0.8 MG TOTAL) BY MOUTH DAILY.  . traZODone (DESYREL) 50 MG tablet Take 2 tablets (100 mg total) by mouth at bedtime as needed for sleep.  Marland Kitchen umeclidinium bromide (INCRUSE ELLIPTA) 62.5 MCG/INH AEPB Inhale 1 puff into the lungs daily.   No facility-administered encounter medications on file as of 09/01/2018.   :  Review of Systems:  Out of a complete 14 point review of systems, all are reviewed and negative with the exception of these symptoms as listed below:  Virtual Visit via Video Note on @ TODAY@  I connected with@ on 09/01/18 at 11:30 AM EDT by a video enabled telemedicine application and verified that I am speaking with the correct person using two identifiers.   I discussed the limitations of evaluation and management by telemedicine and the availability of in person appointments. The patient expressed understanding and agreed to proceed.  History of Present Illness: He reports having difficulty with sleep, he is uncomfortable with the mask.  He has been using the same style of mask he used  before.  He does notice the leak.  He wakes up about twice per average night, he is committed to using the AutoPap but is not necessarily fully rested.    Observations/Objective: The most recent vital signs available for my review in his chart are from 08/09/2018: Blood pressure 148/85 with a pulse of 106, temperature 98.1, weight 182.4 pounds for a BMI of 28.57.  On examination, he is in no acute distress, pleasant and conversant, face is symmetric.  Speech is clear without dysarthria, hypophonia on voice tremor noted.  Comprehension and language skills are good.  Upper body coordination grossly intact, shoulder height grossly equal, upper body mobility grossly intact.  Assessment and Plan: In summary, Cirilo Canner is a very pleasant 73 year old male with an underlying medical history of lung cancer (SCLC with s/p chemo and radiation), hypertension, congestive heart failure, diabetes with associated neuropathy, and overweight state, who presents forA video based appointment via [email or web address] for follow-up consultation of his obstructive sleep apnea after recent retesting.  He had a baseline sleep study in November 2019.  He has been compliant with his new AutoPap machine but is still struggling with the mask and the leakage from the mask.  I suggest that we request a mask refit through his DME company, [facility], he would like to go to the [place] location.  In addition, I suggested we set his machine to a pressure of 13 cm rather than keeping him on AutoPap.  He was used to using CPAP before and his old machine was set at 13 cm, his average pressure currently is 12.4 and 13 would be an appropriate pressure.  He is commended for his treatment adherence and advised to follow-up routinely in 6 months in clinic with a nurse practitioner.  I answered all his questions today and he was in agreement.  We also had to utilize his phone to communicate later during the appointment as we had trouble with the  audio.   Follow Up Instructions:    I discussed the assessment and treatment plan with the patient. The patient was provided an opportunity to ask questions and all were answered. The patient agreed with the plan and demonstrated an understanding of the instructions.   The patient was advised to call back or seek an in-person evaluation if the symptoms worsen or if the condition fails to improve as anticipated.  I provided 15 minutes of non-face-to-face time during this encounter.   Star Age, MD

## 2018-09-01 NOTE — Progress Notes (Signed)
Order for pressure change sent to [facility] via community message. Confirmation received that the order transmitted was successful.

## 2018-09-04 ENCOUNTER — Other Ambulatory Visit: Payer: Self-pay | Admitting: Family

## 2018-09-04 DIAGNOSIS — E1142 Type 2 diabetes mellitus with diabetic polyneuropathy: Secondary | ICD-10-CM

## 2018-09-04 IMAGING — CT NM PET TUM IMG INITIAL (PI) SKULL BASE T - THIGH
8 series · 25 of 25 positions shown · non-contrast
Comparison: Chest CT 04/17/2016

CLINICAL DATA: Initial treatment strategy for lung mass.

EXAM:
NUCLEAR MEDICINE PET SKULL BASE TO THIGH
TECHNIQUE: 10.0 mCi F-18 FDG was injected intravenously. Full-ring PET imaging
was performed from the skull base to thigh after the radiotracer. CT
data was obtained and used for attenuation correction and anatomic
localization.
FASTING BLOOD GLUCOSE:  Value: 15 mg/dl

[Series 3: pet sk_thigh ac · axial · 5.0mm · 4.07mm/px · z∈[-972,-72]mm · 5 of 226 slices shown]
[im 1/226]
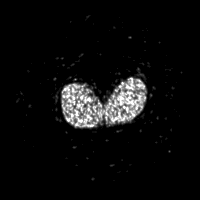
[im 57/226]
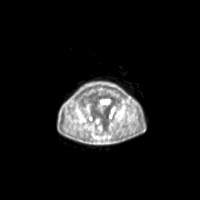
[im 113/226]
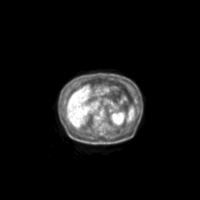
[im 169/226]
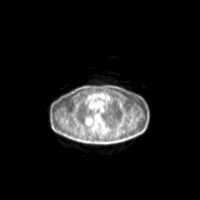
[im 226/226]
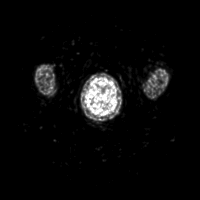

[Series 4: ct sk_thigh 5.0 b31f · axial · 5.0mm · 0.98mm/px · z∈[-972,-72]mm · 5 of 226 slices shown]
[im 1/226]
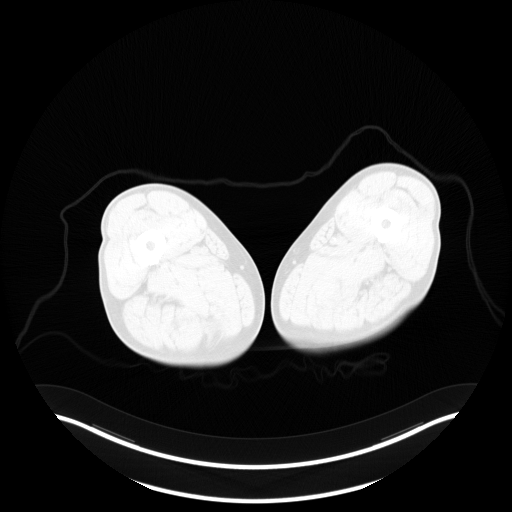
[im 57/226]
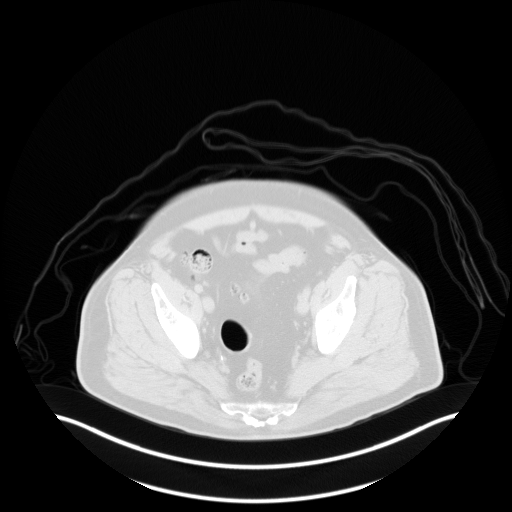
[im 113/226]
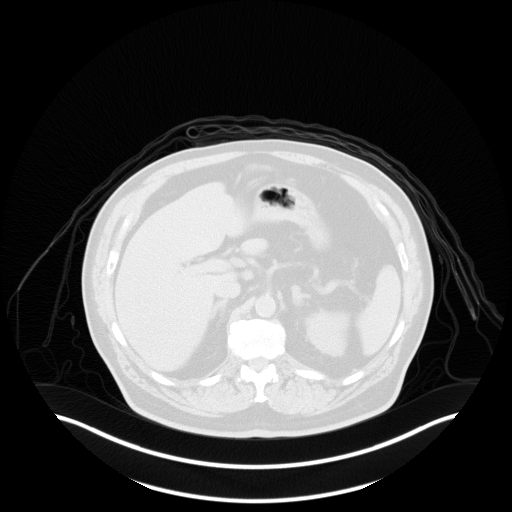
[im 169/226]
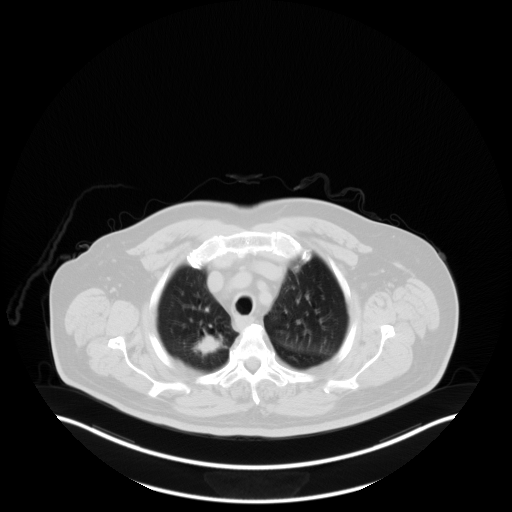
[im 226/226  brain]
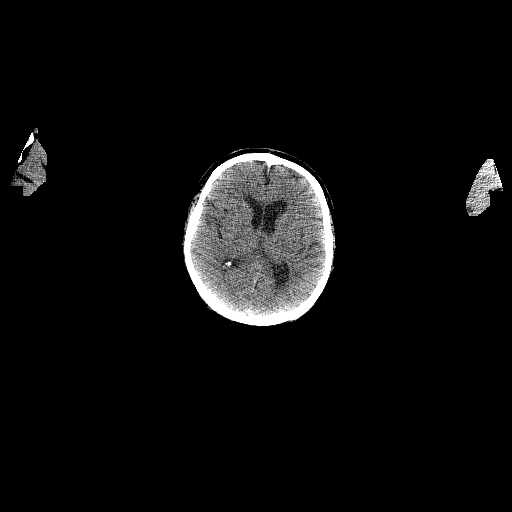

[Series 7: ct sk_thigh 5.0 b70f lung_bone · axial · 5.0mm · 0.64mm/px · 1 of 62 slices shown]
[im 1/62  bone]
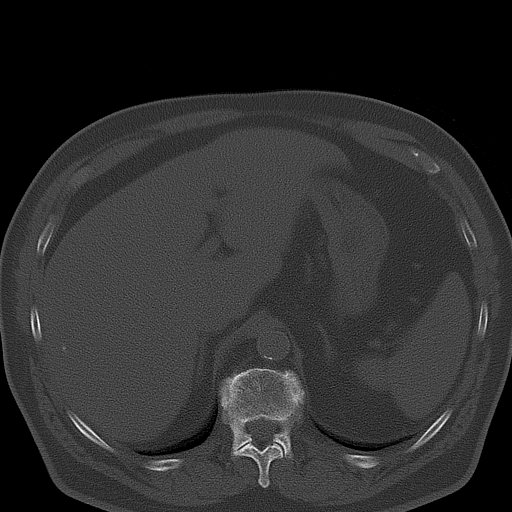

[Series 8: pet sk_thigh nac · axial · 5.0mm · 4.07mm/px · z∈[-972,-72]mm · 5 of 226 slices shown]
[im 1/226]
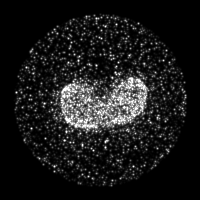
[im 57/226]
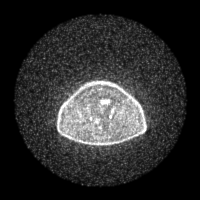
[im 113/226]
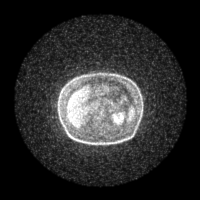
[im 169/226]
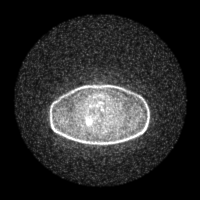
[im 226/226]
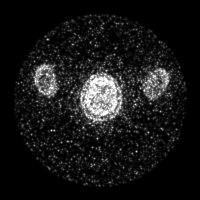

[Series 604: range-ct sk_thigh 5.0 (id)<alpha range> · 2 of 82 slices shown (1 of 2)]
[im 1/82]
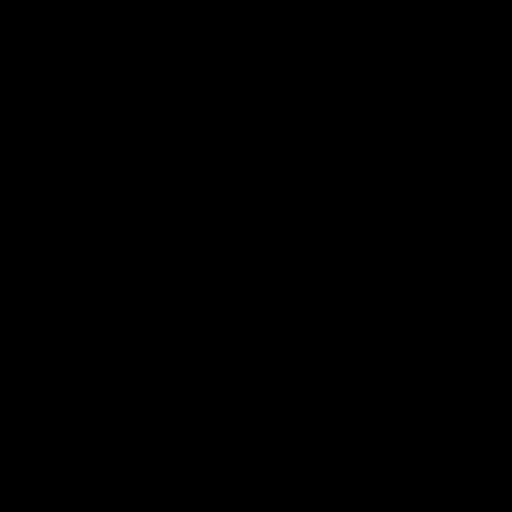
[im 82/82]
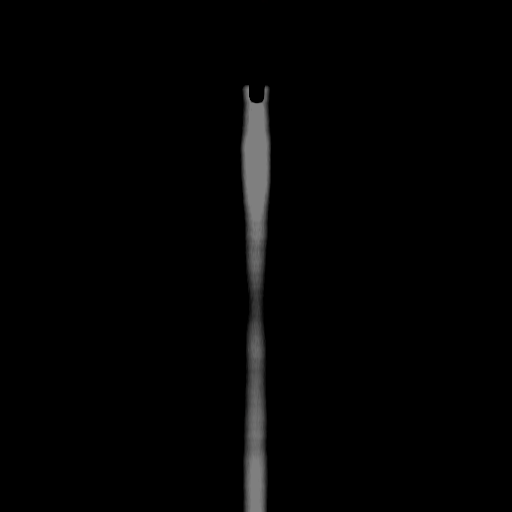

[Series 605: mip collection · coronal · 1.87mm/px · 1 of 32 slices shown]
[im 1/32]
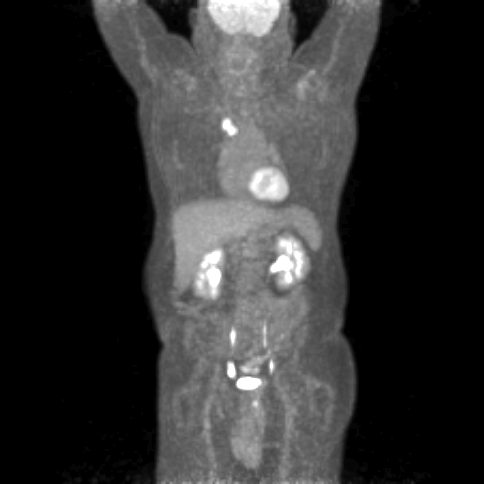

[Series 606: range-ct sk_thigh 5.0 (id)<alpha range> · 5 of 211 slices shown (2 of 2)]
[im 1/211]
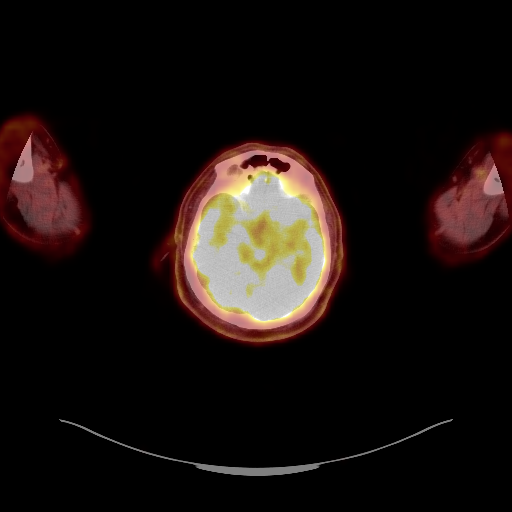
[im 53/211]
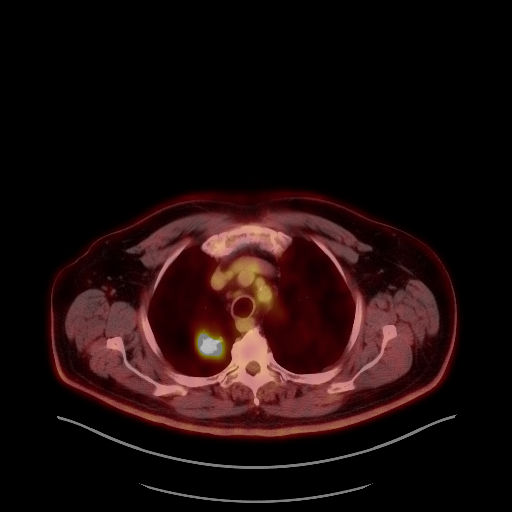
[im 106/211]
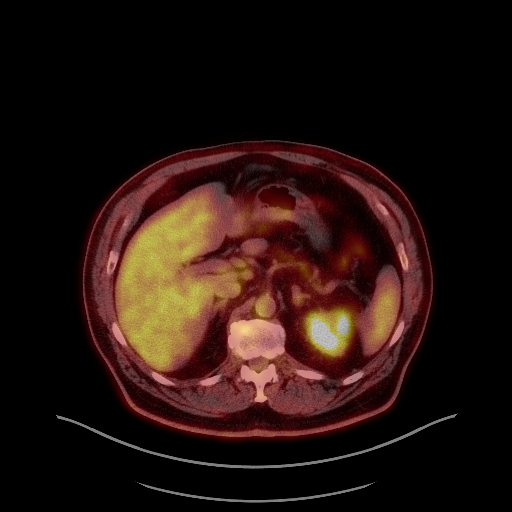
[im 158/211]
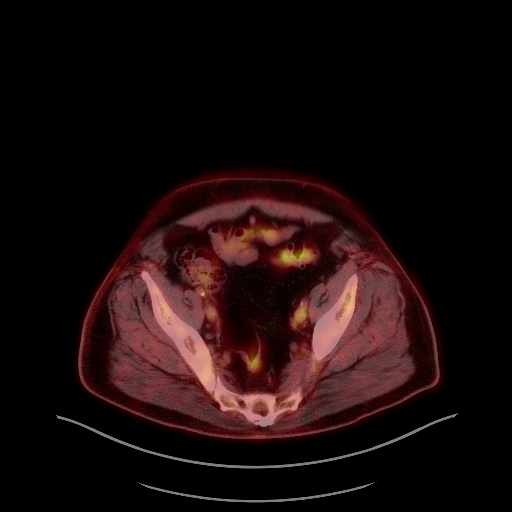
[im 211/211]
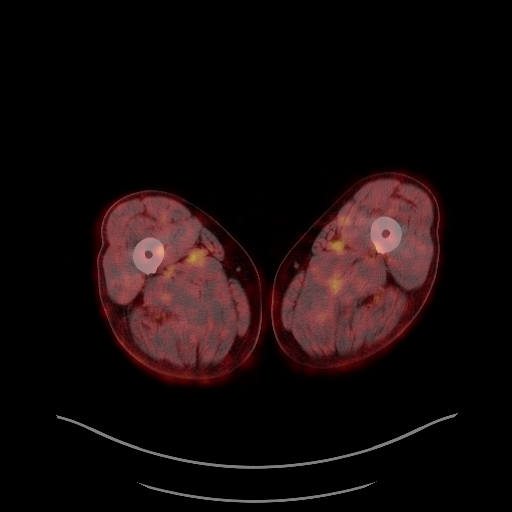

[Series 1032: results mm oncology reading · 0.89mm/px · 1 of 4 slices shown]
[im 1/4]
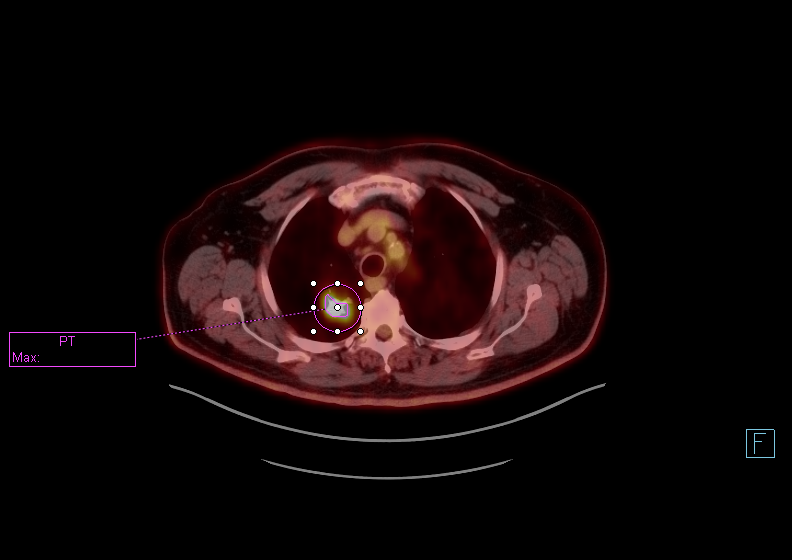

[25 of 25 positions shown; findings below may reference images not displayed]

FINDINGS: NECK

No hypermetabolic lymph nodes in the neck.

CHEST

Adjacent hypermetabolic nodes in the nodules in the RIGHT upper lobe
measure 1.7 and 2.0 cm (image 16 and 18 ; series 7). Both these
nodules are hypermetabolic with SUV max equal 18.

Hypermetabolic RIGHT hilar lymph node is small with SUV max equal
4.4. Hypermetabolic RIGHT paratracheal lymph node is small at 6 mm
with SUV max equal 3.8. No hypermetabolic contralateral lymph nodes.
No hypermetabolic supraclavicular nodes.

Within the LEFT upper lobe 11 mm nodule (image 8, series 7) has low
metabolic activity SUV max equal 1.6.

Within the LEFT lower lobe 6 mm nodule (image 40, series 7) does not
have clear metabolic activity.

ABDOMEN/PELVIS

No abnormal hypermetabolic activity within the liver, pancreas,
adrenal glands, or spleen. No hypermetabolic lymph nodes in the
abdomen or pelvis.

SKELETON

No aggressive osseous lesion.
IMPRESSION: 1. Adjacent hypermetabolic nodules in the RIGHT upper lobe
consistent bronchogenic carcinoma.
2. Suspicion of ipsilateral hilar and paratracheal nodal metastasis.
3. Nodule in the LEFT upper lobe with suspicious morphology has a
relatively low metabolic activity for size. Favor synchronous
primary bronchogenic carcinoma.
4. No metabolic activity associated with a small LEFT lower lobe
pulmonary nodule. Recommend attention on follow-up.

## 2018-09-07 ENCOUNTER — Telehealth: Payer: Self-pay

## 2018-09-07 ENCOUNTER — Telehealth: Payer: Self-pay | Admitting: Family

## 2018-09-07 NOTE — Telephone Encounter (Signed)
I called pt to schedule his 6 mo follow up with MM. No answer, left a message asking him to call us back. If pt calls back, please assist him with this.

## 2018-09-08 ENCOUNTER — Other Ambulatory Visit: Payer: Self-pay

## 2018-09-08 ENCOUNTER — Ambulatory Visit (HOSPITAL_COMMUNITY)
Admission: RE | Admit: 2018-09-08 | Discharge: 2018-09-08 | Disposition: A | Discharge status: Home / Self Care | Payer: Medicare Other | Source: Ambulatory Visit | Attending: Nurse Practitioner | Admitting: Nurse Practitioner

## 2018-09-08 DIAGNOSIS — C3490 Malignant neoplasm of unspecified part of unspecified bronchus or lung: Secondary | ICD-10-CM | POA: Diagnosis not present

## 2018-09-08 DIAGNOSIS — C3491 Malignant neoplasm of unspecified part of right bronchus or lung: Secondary | ICD-10-CM

## 2018-09-08 MED ORDER — IOHEXOL 300 MG/ML  SOLN
75.0000 mL | Freq: Once | INTRAMUSCULAR | Status: AC | PRN
  Administered 2018-09-08: 75 mL via INTRAVENOUS

## 2018-09-09 ENCOUNTER — Ambulatory Visit (HOSPITAL_COMMUNITY): Payer: Medicare Other | Admitting: Hematology

## 2018-09-09 ENCOUNTER — Encounter (HOSPITAL_COMMUNITY): Payer: Self-pay | Admitting: Hematology

## 2018-09-09 ENCOUNTER — Inpatient Hospital Stay (HOSPITAL_COMMUNITY): Payer: Medicare Other | Attending: Hematology | Admitting: Hematology

## 2018-09-09 VITALS — BP 149/76 | HR 110 | Temp 97.9°F | Resp 18 | Wt 181.5 lb

## 2018-09-09 DIAGNOSIS — E039 Hypothyroidism, unspecified: Secondary | ICD-10-CM | POA: Diagnosis not present

## 2018-09-09 DIAGNOSIS — C3411 Malignant neoplasm of upper lobe, right bronchus or lung: Secondary | ICD-10-CM | POA: Insufficient documentation

## 2018-09-09 DIAGNOSIS — C3491 Malignant neoplasm of unspecified part of right bronchus or lung: Secondary | ICD-10-CM

## 2018-09-09 DIAGNOSIS — Z85118 Personal history of other malignant neoplasm of bronchus and lung: Secondary | ICD-10-CM | POA: Diagnosis present

## 2018-09-09 DIAGNOSIS — J449 Chronic obstructive pulmonary disease, unspecified: Secondary | ICD-10-CM | POA: Diagnosis not present

## 2018-09-09 NOTE — Assessment & Plan Note (Signed)
1.Stage IIIA ([id number]) NSCLC, favoring adenocarcinoma: -Scan at diagnosis showed 2 right upper lobe pulmonary nodules measuring 1.7 and 2.0 cm, ipsilateral mediastinal adenopathy, status post bronchoscopy on 05/28/2016, left upper lobe pulmonary nodule not biopsy proven but suspicious with imaging. -Chemoradiation therapy with weekly carboplatin and paclitaxel completed on 08/20/2016.  -Consolidation durvalumab from 09/26/2016 through 09/25/2017.    - He complains of feeling tired occasionally.  I have recommended to do physical exercise. -We reviewed his blood work which is within normal limits.  CT of the chest on 09/08/2018 shows stable perihilar and right upper lobe radiation change.  Left pulmonary nodules are unchanged.  No new or progressive disease seen. -We will see him back in 6 months with repeat CT scan.   2.  Hypothyroidism: -Latest TSH is 3.  Patient reports that he has been taking Synthroid 50 mcg daily.  No intervention necessary.

## 2018-09-09 NOTE — Progress Notes (Signed)
[place],  [id number]   CLINIC:  Medical Oncology/Hematology  PCP:  [name], [place] [address] [phone]   REASON FOR VISIT:  Follow-up for NSCLC  CURRENT THERAPY: Clinical Surveillance   BRIEF ONCOLOGIC HISTORY:    Non-small cell lung cancer, right ([place])   04/17/2016 Imaging    CT chest- 1. Examination is positive for bilateral upper lobe pulmonary lesions suspicious for malignancy. Further investigation with PET-CT and tissue sampling is advised. 2. Borderline enlarged paratracheal lymph nodes and right hilar nodes. Cannot rule out metastatic adenopathy. Attention to these areas on PET-CT is advised. 3. Emphysema 4. Aortic atherosclerosis and multi vessel coronary artery calcification.    04/18/2016 Initial Diagnosis    Adenocarcinoma of lung, right ([place])    04/24/2016 PET scan    1. Adjacent hypermetabolic nodules in the RIGHT upper lobe consistent bronchogenic carcinoma. 2. Suspicion of ipsilateral hilar and paratracheal nodal metastasis. 3. Nodule in the LEFT upper lobe with suspicious morphology has a relatively low metabolic activity for size. Favor synchronous primary bronchogenic carcinoma. 4. No metabolic activity associated with a small LEFT lower lobe pulmonary nodule. Recommend attention on follow-up.    05/06/2016 Imaging    MRI brain- Negative for metastatic disease.    05/28/2016 Procedure    1. Electromagnetic navigational bronchoscopy with brushings, needle     aspirations and biopsies of bilateral upper lobe nodules. 2. Endobronchial ultrasound with mediastinal and hilar lymph node     aspirations. By Dr. [name]    06/03/2016 Pathology Results    Diagnosis 1. Lung, biopsy, Right upper lobe - BLOOD AND BENIGN BRONCHIAL AND LUNG TISSUE. 2. Lung, biopsy, Left upper lobe - BLOOD AND BENIGN BRONCHIAL AND LUNG TISSUE.    06/03/2016 Pathology Results   Diagnosis TRANSBRONCHIAL NEEDLE ASPIRATION, NAVIGATION, RUL (SPECIMEN 5 OF 7, COLLECTED 05/28/16): MALIGNANT CELLS CONSISTENT WITH NON-SMALL CELL CARCINOMA Comment There are limited tumor cells in the cell block (insufficient for molecular testing). TTF-1 is positive in a few of the atypical cells. They appear negative for synaptophysin and cytokeratin 5/6. Overall there is limited tumor, but a lung adenocarcinoma is slightly favored. Dr. [name] has reviewed the case. The case was called to Dr. [name] on 06/03/2016.    06/13/2016 Procedure    Port placed by Dr. Arnoldo Morale    06/25/2016 - 08/15/2016 Chemotherapy    The patient had palonosetron (ALOXI) injection 0.25 mg, 0.25 mg, Intravenous,  Once, 6 of 6 cycles  CARBOplatin (PARAPLATIN) 220 mg in sodium chloride 0.9 % 250 mL chemo infusion, 220 mg (100 % of original dose 215.8 mg), Intravenous,  Once, 6 of 6 cycles Dose modification:   (original dose 215.8 mg, Cycle 1),   (original dose 215.8 mg, Cycle 5),   (original dose 215.8 mg, Cycle 6),   (original dose 215.8 mg, Cycle 2),   (original dose 215.8 mg, Cycle 3),   (original dose 215.8 mg, Cycle 4)  PACLitaxel (TAXOL) 90 mg in dextrose 5 % 250 mL chemo infusion ( for chemotherapy treatment.      06/25/2016 - 08/20/2016 Radiation Therapy    Radiation in [place], [place] by Dr. Quitman Livings.  R lung to 6600 cGy    07/09/2016 - 07/10/2016 Hospital Admission    Admit date: 07/09/2016  Admission diagnosis: Fever and chills Additional comments: Negative work-up, discharged with course of Levaquin    07/09/2016 Treatment Plan Change    Treatment deferred x 1 week due  to hospitalization     08/05/2016 - 08/06/2016 Hospital Admission    Admit date: 08/05/2016 Admission diagnosis: Joint pain Additional comments: Treated with steroids with symptomatic improvement    08/25/2016 Imaging    MRI brain- Negative for intracranial metastasis on this motion degraded examination.  Stable mild-to-moderate chronic small  vessel ischemic disease.    09/18/2016 PET scan    1. Partial metabolic response. Right upper lobe 2.6 cm hypermetabolic pulmonary nodule, decreased in size and metabolism. Mildly hypermetabolic right hilar nodal metastasis, decreased in metabolism. Right paratracheal lymph node is no longer hypermetabolic. No new or progressive metastatic disease. 2. Apical left upper lobe 0.9 cm solid pulmonary nodule, slightly decreased in size, with no significant metabolism. The change in size could indicate a neoplastic etiology for this nodule. 3. Additional subcentimeter pulmonary nodules in the left lung are stable and below PET resolution . 4. Additional findings include aortic atherosclerosis, coronary atherosclerosis, moderate emphysema, stable small pericardial effusion/thickening and mild sigmoid diverticulosis.    01/13/2017 Imaging    CT chest with contrast IMPRESSION: 1. No change in size of the hypermetabolic nodular thickening in the RIGHT upper lobe. 2. LEFT upper lobe spiculated nodule is stable. 3. Rounded LEFT lower lobe pulmonary nodule is stable. 4. No evidence of lung cancer progression.    05/08/2017 Adverse Reaction    Development of Hypothyroidism- likely secondary to immunotherapy.  Levothyroxine therapy initiated.       CANCER STAGING: Cancer Staging Non-small cell lung cancer, right [facility]) Staging form: Lung, AJCC 8th Edition - Clinical stage from 06/05/2016: Stage IIIA (cT1b(2), cN2, cM0) - Signed by Baird Cancer, PA-C on 08/21/2016    INTERVAL HISTORY:  Mr. Deery 73 y.o. male returns for routine follow-up of non-small cell lung cancer.  He complains of shortness of breath on exertion.  Stable numbness in the hands and feet.  Denies any cough or hemoptysis.  Denies any hospitalizations or infections.  He is following up with his pulmonologist for COPD.  He is taking trazodone 50 mg at bedtime for sleep.  He is wondering if he could double up the medication.   His appetite is 50%.  Energy levels are low.       REVIEW OF SYSTEMS:  Review of Systems  Constitutional: Negative.   HENT:  Negative.   Eyes: Negative.   Respiratory: Positive for shortness of breath.   Cardiovascular: Negative.   Gastrointestinal: Negative.   Endocrine: Negative.   Genitourinary: Negative.    Musculoskeletal: Negative.   Skin: Negative.   Neurological: Positive for numbness.  Hematological: Negative.   Psychiatric/Behavioral: Negative.      PAST MEDICAL/SURGICAL HISTORY:  Past Medical History:  Diagnosis Date  . Adenocarcinoma of lung, right ([place]) 04/18/2016  . Anxiety   . Arthritis   . Asthma   . CHF (congestive heart failure) ([place])   . COPD (chronic obstructive pulmonary disease) ([place])   . Depression   . Diabetes mellitus without complication (HCC)    no meds  . DM (diabetes mellitus) ([place]) 07/09/2016  . Dyspnea   . History of kidney stones   . Hyperlipidemia   . Hypertension   . Macular degeneration   . Neuropathy   . Non-small cell lung cancer, right ([place]) 04/18/2016  . Prostatitis   . Pulmonary nodule, left 07/16/2016  . Sleep apnea    cpap   Past Surgical History:  Procedure Laterality Date  . CATARACT EXTRACTION, BILATERAL Bilateral   . NO PAST SURGERIES    .  PORTACATH PLACEMENT Left 06/13/2016   Procedure: INSERTION PORT-A-CATH;  Surgeon: Aviva Signs, MD;  Location: [facility];  Service: General;  Laterality: Left;  Marland Kitchen VIDEO BRONCHOSCOPY WITH ENDOBRONCHIAL NAVIGATION N/A 05/28/2016   Procedure: VIDEO BRONCHOSCOPY WITH ENDOBRONCHIAL NAVIGATION;  Surgeon: [name], MD;  Location: [facility];  Service: Thoracic;  Laterality: N/A;  . VIDEO BRONCHOSCOPY WITH ENDOBRONCHIAL ULTRASOUND N/A 05/28/2016   Procedure: VIDEO BRONCHOSCOPY WITH ENDOBRONCHIAL ULTRASOUND;  Surgeon: [name], MD;  Location: [facility];  Service: Thoracic;  Laterality: N/A;     SOCIAL HISTORY:  Social History   Socioeconomic History  . Marital status: Divorced     Spouse name: Not on file  . Number of children: 5  . Years of education: 10  . Highest education level: 10th grade  Occupational History  . Occupation: Retired  Scientific laboratory technician  . Financial resource strain: Not very hard  . Food insecurity:    Worry: Never true    Inability: Never true  . Transportation needs:    Medical: No    Non-medical: No  Tobacco Use  . Smoking status: Current Every Day Smoker    Packs/day: 0.10    Years: 55.00    Pack years: 5.50    Types: Cigarettes    Start date: 03/11/1961  . Smokeless tobacco: Never Used  . Tobacco comment: smoking 1 to 2 cigarettes per day  Substance and Sexual Activity  . Alcohol use: No  . Drug use: No  . Sexual activity: Yes    Birth control/protection: None  Lifestyle  . Physical activity:    Days per week: 0 days    Minutes per session: 0 min  . Stress: To some extent  Relationships  . Social connections:    Talks on phone: More than three times a week    Gets together: More than three times a week    Attends religious service: Never    Active member of club or organization: No    Attends meetings of clubs or organizations: Never    Relationship status: Divorced  . Intimate partner violence:    Fear of current or ex partner: Not on file    Emotionally abused: Not on file    Physically abused: Not on file    Forced sexual activity: Not on file  Other Topics Concern  . Not on file  Social History Narrative  . Not on file    FAMILY HISTORY:  Family History  Problem Relation Age of Onset  . Hypertension Mother   . Diabetes Father   . Heart disease Father   . Stroke Father   . Hypertension Sister     CURRENT MEDICATIONS:  Outpatient Encounter Medications as of 09/09/2018  Medication Sig Note  . albuterol (PROVENTIL HFA;VENTOLIN HFA) 108 (90 Base) MCG/ACT inhaler USE 2 PUFF BY MOUTH EVERY 6 HOURS AS NEEDED FOR SHORTNESS OF BREATH OR WHEEZING   . aspirin EC 81 MG tablet Take 81 mg by mouth daily.   . cloNIDine  (CATAPRES) 0.1 MG tablet TAKE 1 TABLET (0.1 MG TOTAL) BY MOUTH 3 (THREE) TIMES DAILY.   Marland Kitchen dutasteride (AVODART) 0.5 MG capsule Take 1 capsule (0.5 mg total) by mouth daily.   . fluticasone (FLONASE) 50 MCG/ACT nasal spray Place 2 sprays into both nostrils daily. 12/05/2016: As needed  . furosemide (LASIX) 20 MG tablet Take 1 tablet (20 mg total) by mouth daily. 08/09/2018: Pt takes medication as needed  . gabapentin (NEURONTIN) 300 MG capsule Take 1  capsule in Am and 2 at night   . levothyroxine (SYNTHROID) 75 MCG tablet Take 1 tablet (75 mcg total) by mouth daily.   Marland Kitchen linagliptin (TRADJENTA) 5 MG TABS tablet Take 1 tablet (5 mg total) by mouth daily.   Marland Kitchen lisinopril-hydrochlorothiazide (ZESTORETIC) 20-25 MG tablet TAKE 1 TABLET BY MOUTH EVERY DAY   . meloxicam (MOBIC) 7.5 MG tablet Take 1 tablet (7.5 mg total) by mouth daily as needed for pain (back pain).   . metFORMIN (GLUCOPHAGE-XR) 500 MG 24 hr tablet TAKE 1 TABLET BY MOUTH EVERY DAY WITH BREAKFAST   . pravastatin (PRAVACHOL) 40 MG tablet Take 1 tablet (40 mg total) by mouth every 3 (three) days. M-W-F   . prochlorperazine (COMPAZINE) 10 MG tablet Take 1 tablet (10 mg total) by mouth every 6 (six) hours as needed for nausea or vomiting.   . tamsulosin (FLOMAX) 0.4 MG CAPS capsule TAKE 2 CAPSULES (0.8 MG TOTAL) BY MOUTH DAILY.   . traZODone (DESYREL) 50 MG tablet Take 2 tablets (100 mg total) by mouth at bedtime as needed for sleep.   Marland Kitchen umeclidinium bromide (INCRUSE ELLIPTA) 62.5 MCG/INH AEPB Inhale 1 puff into the lungs daily.   Marland Kitchen JARDIANCE 10 MG TABS tablet TAKE 1 TABLET BY MOUTH ONCE DAILY (Patient not taking: Reported on 08/09/2018)    No facility-administered encounter medications on file as of 09/09/2018.     ALLERGIES:  No Known Allergies   PHYSICAL EXAM:  ECOG Performance status: 1  Vitals:   09/09/18 1550  BP: (!) 149/76  Pulse: (!) 110  Resp: 18  Temp: 97.9 F (36.6 C)  SpO2: 99%   Filed Weights   09/09/18 1550  Weight:  181 lb 8 oz (82.3 kg)    Physical Exam Vitals signs reviewed.  Constitutional:      Appearance: Normal appearance.  Cardiovascular:     Rate and Rhythm: Normal rate and regular rhythm.     Heart sounds: Normal heart sounds.  Pulmonary:     Effort: Pulmonary effort is normal.     Breath sounds: Normal breath sounds.  Abdominal:     General: There is no distension.     Palpations: Abdomen is soft. There is no mass.  Musculoskeletal:        General: No swelling.  Skin:    General: Skin is warm.  Neurological:     General: No focal deficit present.     Mental Status: He is alert and oriented to person, place, and time.  Psychiatric:        Mood and Affect: Mood normal.        Behavior: Behavior normal.      LABORATORY DATA:  I have reviewed the labs as listed.  CBC    Component Value Date/Time   WBC 6.4 08/02/2018 1052   RBC 4.24 08/02/2018 1052   HGB 13.8 08/02/2018 1052   HGB 13.2 03/22/2018 1128   HCT 41.8 08/02/2018 1052   HCT 40.1 03/22/2018 1128   PLT 252 08/02/2018 1052   PLT 355 03/22/2018 1128   MCV 98.6 08/02/2018 1052   MCV 91 03/22/2018 1128   MCH 32.5 08/02/2018 1052   MCHC 33.0 08/02/2018 1052   RDW 14.5 08/02/2018 1052   RDW 15.0 03/22/2018 1128   LYMPHSABS 0.8 08/02/2018 1052   LYMPHSABS 0.8 03/22/2018 1128   MONOABS 0.8 08/02/2018 1052   EOSABS 0.2 08/02/2018 1052   EOSABS 0.2 03/22/2018 1128   BASOSABS 0.0 08/02/2018 1052   BASOSABS 0.1  03/22/2018 1128   CMP Latest Ref Rng & Units 08/02/2018 03/22/2018 03/08/2018  Glucose 70 - 99 mg/dL 173(H) 152(H) 176(H)  BUN 8 - 23 mg/dL 17 16 19   Creatinine 0.61 - 1.24 mg/dL 0.93 0.87 0.98  Sodium 135 - 145 mmol/L 136 139 136  Potassium 3.5 - 5.1 mmol/L 4.1 4.1 3.9  Chloride 98 - 111 mmol/L 99 97 100  CO2 22 - 32 mmol/L 25 21 29   Calcium 8.9 - 10.3 mg/dL 9.4 9.1 8.7(L)  Total Protein 6.5 - 8.1 g/dL 6.9 6.9 7.4  Total Bilirubin 0.3 - 1.2 mg/dL 0.5 <0.2 0.6  Alkaline Phos 38 - 126 U/L 73 93 72  AST 15  - 41 U/L 19 15 14(L)  ALT 0 - 44 U/L 18 12 12        DIAGNOSTIC IMAGING:  I have independently reviewed the scans and discussed with the patient.      ASSESSMENT & PLAN:   Non-small cell lung cancer, right (HCC) 1.Stage IIIA (cT1BcN2cM0) NSCLC, favoring adenocarcinoma: -Scan at diagnosis showed 2 right upper lobe pulmonary nodules measuring 1.7 and 2.0 cm, ipsilateral mediastinal adenopathy, status post bronchoscopy on 05/28/2016, left upper lobe pulmonary nodule not biopsy proven but suspicious with imaging. -Chemoradiation therapy with weekly carboplatin and paclitaxel completed on 08/20/2016.  -Consolidation durvalumab from 09/26/2016 through 09/25/2017.    - He complains of feeling tired occasionally.  I have recommended to do physical exercise. -We reviewed his blood work which is within normal limits.  CT of the chest on 09/08/2018 shows stable perihilar and right upper lobe radiation change.  Left pulmonary nodules are unchanged.  No new or progressive disease seen. -We will see him back in 6 months with repeat CT scan.   2.  Hypothyroidism: -Latest TSH is 3.  Patient reports that he has been taking Synthroid 50 mcg daily.  No intervention necessary.        Orders placed this encounter:  Orders Placed This Encounter  Procedures  . CT Chest W Contrast      Derek Jack, MD [place] [phone]

## 2018-09-09 NOTE — Patient Instructions (Signed)
[place] at [facility] Discharge Instructions  You were seen today by Dr. Delton Coombes. He went over your recent scan results. He would like you to start walking a mile a day to help boost your energy.  He will see you back in 6 months for labs and follow up.   Thank you for choosing [place] at [facility] to provide your oncology and hematology care.  To afford each patient quality time with our provider, please arrive at least 15 minutes before your scheduled appointment time.   If you have a lab appointment with the [facility] please come in thru the  Main Entrance and check in at the main information desk  You need to re-schedule your appointment should you arrive 10 or more minutes late.  We strive to give you quality time with our providers, and arriving late affects you and other patients whose appointments are after yours.  Also, if you no show three or more times for appointments you may be dismissed from the clinic at the providers discretion.     Again, thank you for choosing [facility].  Our hope is that these requests will decrease the amount of time that you wait before being seen by our physicians.       _____________________________________________________________  Should you have questions after your visit to [facility], please contact our office at [phone] between the hours of 8:00 a.m. and 4:30 p.m.  Voicemails left after 4:00 p.m. will not be returned until the following business day.  For prescription refill requests, have your pharmacy contact our office and allow 72 hours.    Cancer Center Support Programs:   > Cancer Support Group  2nd Tuesday of the month 1pm-2pm, Journey Room

## 2018-09-23 ENCOUNTER — Other Ambulatory Visit: Payer: Self-pay | Admitting: Family

## 2018-10-07 ENCOUNTER — Encounter: Payer: Self-pay | Admitting: Family

## 2018-10-07 ENCOUNTER — Other Ambulatory Visit: Payer: Medicare Other

## 2018-10-07 ENCOUNTER — Other Ambulatory Visit: Payer: Self-pay

## 2018-10-07 ENCOUNTER — Ambulatory Visit (INDEPENDENT_AMBULATORY_CARE_PROVIDER_SITE_OTHER): Payer: Medicare Other | Admitting: Family

## 2018-10-07 DIAGNOSIS — E1169 Type 2 diabetes mellitus with other specified complication: Secondary | ICD-10-CM

## 2018-10-07 DIAGNOSIS — E785 Hyperlipidemia, unspecified: Secondary | ICD-10-CM | POA: Diagnosis not present

## 2018-10-07 DIAGNOSIS — G4733 Obstructive sleep apnea (adult) (pediatric): Secondary | ICD-10-CM

## 2018-10-07 DIAGNOSIS — M159 Polyosteoarthritis, unspecified: Secondary | ICD-10-CM

## 2018-10-07 DIAGNOSIS — I509 Heart failure, unspecified: Secondary | ICD-10-CM

## 2018-10-07 DIAGNOSIS — I10 Essential (primary) hypertension: Secondary | ICD-10-CM

## 2018-10-07 DIAGNOSIS — E039 Hypothyroidism, unspecified: Secondary | ICD-10-CM

## 2018-10-07 DIAGNOSIS — C3491 Malignant neoplasm of unspecified part of right bronchus or lung: Secondary | ICD-10-CM

## 2018-10-07 DIAGNOSIS — J431 Panlobular emphysema: Secondary | ICD-10-CM

## 2018-10-07 DIAGNOSIS — F17200 Nicotine dependence, unspecified, uncomplicated: Secondary | ICD-10-CM

## 2018-10-07 DIAGNOSIS — M150 Primary generalized (osteo)arthritis: Secondary | ICD-10-CM

## 2018-10-07 DIAGNOSIS — Z9989 Dependence on other enabling machines and devices: Secondary | ICD-10-CM

## 2018-10-07 DIAGNOSIS — I2510 Atherosclerotic heart disease of native coronary artery without angina pectoris: Secondary | ICD-10-CM

## 2018-10-07 DIAGNOSIS — E1142 Type 2 diabetes mellitus with diabetic polyneuropathy: Secondary | ICD-10-CM | POA: Diagnosis not present

## 2018-10-07 LAB — BAYER DCA HB A1C WAIVED: HB A1C (BAYER DCA - WAIVED): 7 % — ABNORMAL HIGH (ref <7.0)

## 2018-10-07 MED ORDER — INCRUSE ELLIPTA 62.5 MCG/INH IN AEPB: 1.0000 | INHALATION_SPRAY | Freq: Every day | RESPIRATORY_TRACT | 1 refills | Status: DC

## 2018-10-07 MED ORDER — PRAVASTATIN SODIUM 40 MG PO TABS: 40.0000 mg | ORAL_TABLET | ORAL | 1 refills | Status: DC

## 2018-10-07 MED ORDER — GABAPENTIN 300 MG PO CAPS: ORAL_CAPSULE | ORAL | 0 refills | Status: DC

## 2018-10-07 MED ORDER — BREO ELLIPTA 200-25 MCG/INH IN AEPB: 1.0000 | INHALATION_SPRAY | Freq: Every day | RESPIRATORY_TRACT | 1 refills | Status: DC

## 2018-10-07 NOTE — Addendum Note (Signed)
Addended by: Liliane Bade on: 10/07/2018 10:28 AM   Modules accepted: Orders

## 2018-10-07 NOTE — Progress Notes (Signed)
Virtual Visit via telephone Note  I connected with John [name] on 10/07/18 at 9:30 AM by telephone and verified that I am speaking with the correct person using two identifiers. John [name] is currently located at home  and no one  is currently with her during visit. The provider, Evelina Dun, FNP is located in their office at time of visit.  I discussed the limitations, risks, security and privacy concerns of performing an evaluation and management service by telephone and the availability of in person appointments. I also discussed with the patient that there may be a patient responsible charge related to this service. The patient expressed understanding and agreed to proceed.   History and Present Illness:  Pt presents to the office today for a virtual visit for chronic care follow up. He is followed by Cardiologists every 6 months for CHF. Followed by Oncologists every 6 months for non-small lung cancer. Diabetes He presents for his follow-up diabetic visit. He has type 2 diabetes mellitus. His disease course has been stable. There are no hypoglycemic associated symptoms. Associated symptoms include blurred vision, fatigue and foot paresthesias. Pertinent negatives for diabetes include no visual change. Symptoms are stable. Diabetic complications include heart disease. Risk factors for coronary artery disease include dyslipidemia, diabetes mellitus, male sex, hypertension and sedentary lifestyle. (Does not check blood glucose at home) Eye exam is current.  Hypertension This is a chronic problem. The current episode started more than 1 year ago. The problem has been resolved since onset. The problem is controlled. Associated symptoms include blurred vision, malaise/fatigue, peripheral edema and shortness of breath. Risk factors for coronary artery disease include dyslipidemia, diabetes mellitus, obesity, male gender and sedentary lifestyle. The current treatment provides moderate  improvement. Hypertensive end-organ damage includes heart failure. Identifiable causes of hypertension include a thyroid problem.  Congestive Heart Failure Presents for follow-up visit. Associated symptoms include edema, fatigue and shortness of breath. Pertinent negatives include no unexpected weight change.  Thyroid Problem Presents for follow-up visit. Symptoms include constipation and fatigue. Patient reports no depressed mood or visual change. The symptoms have been stable. His past medical history is significant for heart failure and hyperlipidemia.  Hyperlipidemia This is a chronic problem. The current episode started more than 1 year ago. Associated symptoms include shortness of breath. Current antihyperlipidemic treatment includes statins. The current treatment provides moderate improvement of lipids. Risk factors for coronary artery disease include dyslipidemia, diabetes mellitus, male sex, hypertension and a sedentary lifestyle.  Arthritis Presents for follow-up visit. He complains of stiffness and joint warmth. Affected locations include the right hip and left hip. His pain is at a severity of 7/10. Associated symptoms include fatigue.  COPD PT using Incruse daily. States he continues to have SOB. He continues to smoke 1/2 pack a day.   Diabetic Neuropathy PT states he has bilateral feet numbness, Denies any burning pain as long as he takes his gabapentin.  OSA Pt using CPAP every night. Stable. States he continues to feel tired.   Review of Systems  Constitutional: Positive for fatigue and malaise/fatigue. Negative for unexpected weight change.  Eyes: Positive for blurred vision.  Respiratory: Positive for shortness of breath.   Gastrointestinal: Positive for constipation.  Musculoskeletal: Positive for arthritis and stiffness.     Observations/Objective: No SOB or distress   Assessment and Plan: John [name] comes in today with chief complaint of No chief complaint on  file.   Diagnosis and orders addressed:  1. Diabetic polyneuropathy associated with type 2  diabetes mellitus ([place]) - CMP14+EGFR; Future - gabapentin (NEURONTIN) 300 MG capsule; Take 1 capsule in Am and 2 at night  Dispense: 270 capsule; Refill: 0  2. Hyperlipidemia associated with type 2 diabetes mellitus ([place]) - CMP14+EGFR; Future - Lipid panel; Future - pravastatin (PRAVACHOL) 40 MG tablet; Take 1 tablet (40 mg total) by mouth every 3 (three) days. M-W-F  Dispense: 90 tablet; Refill: 1  3. Panlobular emphysema ([place]) - CMP14+EGFR; Future - umeclidinium bromide (INCRUSE ELLIPTA) 62.5 MCG/INH AEPB; Inhale 1 puff into the lungs daily.  Dispense: 90 each; Refill: 1  4. Essential hypertension - CMP14+EGFR; Future  5. Congestive heart failure, unspecified HF chronicity, unspecified heart failure type ([place]) - CMP14+EGFR; Future  6. Non-small cell lung cancer, right ([place]) - CMP14+EGFR; Future  7. Hypothyroidism, unspecified type - CMP14+EGFR; Future - TSH; Future  8. Type 2 diabetes mellitus with diabetic polyneuropathy, without long-term current use of insulin (HCC) - Bayer DCA Hb A1c Waived; Future - CMP14+EGFR; Future - Microalbumin / creatinine urine ratio; Future  9. Primary osteoarthritis involving multiple joints - CMP14+EGFR; Future  10. Current smoker - CMP14+EGFR; Future  11. Obstructive sleep apnea treated with continuous positive airway pressure (CPAP) - CMP14+EGFR; Future   Labs pending Health Maintenance reviewed Diet and exercise encouraged  Follow up plan: 3 months       I discussed the assessment and treatment plan with the patient. The patient was provided an opportunity to ask questions and all were answered. The patient agreed with the plan and demonstrated an understanding of the instructions.   The patient was advised to call back or seek an in-person evaluation if the symptoms worsen or if the condition fails to improve as anticipated.  The  above assessment and management plan was discussed with the patient. The patient verbalized understanding of and has agreed to the management plan. Patient is aware to call the clinic if symptoms persist or worsen. Patient is aware when to return to the clinic for a follow-up visit. Patient educated on when it is appropriate to go to the emergency department.   Time call ended: 9:50 AM    I provided 20 minutes of non-face-to-face time during this encounter.    Evelina Dun, FNP

## 2018-10-08 LAB — LIPID PANEL
Chol/HDL Ratio: 5.3 ratio — ABNORMAL HIGH (ref 0.0–5.0)
Cholesterol, Total: 154 mg/dL (ref 100–199)
HDL: 29 mg/dL — ABNORMAL LOW (ref >39)
LDL Calculated: 82 mg/dL (ref 0–99)
Triglycerides: 215 mg/dL — ABNORMAL HIGH (ref 0–149)
VLDL Cholesterol Cal: 43 mg/dL — ABNORMAL HIGH (ref 5–40)

## 2018-10-08 LAB — CMP14+EGFR
ALT: 13 IU/L (ref 0–44)
AST: 13 IU/L (ref 0–40)
Albumin/Globulin Ratio: 1.7 (ref 1.2–2.2)
Albumin: 4.4 g/dL (ref 3.7–4.7)
Alkaline Phosphatase: 93 IU/L (ref 39–117)
BUN/Creatinine Ratio: 22 (ref 10–24)
BUN: 19 mg/dL (ref 8–27)
Bilirubin Total: <0.2 mg/dL (ref 0.0–1.2)
CO2: 25 mmol/L (ref 20–29)
Calcium: 9.8 mg/dL (ref 8.6–10.2)
Chloride: 97 mmol/L (ref 96–106)
Creatinine, Ser: 0.88 mg/dL (ref 0.76–1.27)
GFR calc Af Amer: 99 mL/min/{1.73_m2} (ref >59)
GFR calc non Af Amer: 86 mL/min/{1.73_m2} (ref >59)
Globulin, Total: 2.6 g/dL (ref 1.5–4.5)
Glucose: 159 mg/dL — ABNORMAL HIGH (ref 65–99)
Potassium: 4 mmol/L (ref 3.5–5.2)
Sodium: 138 mmol/L (ref 134–144)
Total Protein: 7 g/dL (ref 6.0–8.5)

## 2018-10-08 LAB — MICROALBUMIN / CREATININE URINE RATIO
Creatinine, Urine: 154.9 mg/dL
Microalb/Creat Ratio: 101 mg/g creat — ABNORMAL HIGH (ref 0–29)
Microalbumin, Urine: 156 ug/mL

## 2018-10-08 LAB — TSH: TSH: 2.21 u[IU]/mL (ref 0.450–4.500)

## 2018-11-09 ENCOUNTER — Other Ambulatory Visit: Payer: Self-pay

## 2018-11-09 ENCOUNTER — Inpatient Hospital Stay (HOSPITAL_COMMUNITY): Payer: Medicare Other | Attending: Hematology

## 2018-11-09 ENCOUNTER — Encounter (HOSPITAL_COMMUNITY): Payer: Medicare Other

## 2018-11-09 DIAGNOSIS — Z452 Encounter for adjustment and management of vascular access device: Secondary | ICD-10-CM | POA: Insufficient documentation

## 2018-11-09 DIAGNOSIS — Z85118 Personal history of other malignant neoplasm of bronchus and lung: Secondary | ICD-10-CM | POA: Insufficient documentation

## 2018-11-09 MED ORDER — SODIUM CHLORIDE 0.9% FLUSH
20.0000 mL | Freq: Once | INTRAVENOUS | Status: AC
  Administered 2018-11-09: 20 mL via INTRAVENOUS

## 2018-11-09 MED ORDER — HEPARIN SOD (PORK) LOCK FLUSH 100 UNIT/ML IV SOLN
500.0000 [IU] | Freq: Once | INTRAVENOUS | Status: AC
  Administered 2018-11-09: 500 [IU] via INTRAVENOUS

## 2018-11-09 NOTE — Progress Notes (Signed)
John [name] presented for Portacath access and flush.  Proper placement of portacath confirmed by CXR.  Portacath located left chest wall accessed with  H 20 needle.  Good blood return present. Portacath flushed with 22ml NS and 500U/74ml Heparin and needle removed intact.  Procedure tolerated well and without incident.

## 2018-11-09 NOTE — Patient Instructions (Signed)
[place] at [facility]  Discharge Instructions:  Port flushed today. _______________________________________________________________  Thank you for choosing [place] at [facility] to provide your oncology and hematology care.  To afford each patient quality time with our providers, please arrive at least 15 minutes before your scheduled appointment.  You need to re-schedule your appointment if you arrive 10 or more minutes late.  We strive to give you quality time with our providers, and arriving late affects you and other patients whose appointments are after yours.  Also, if you no show three or more times for appointments you may be dismissed from the clinic.  Again, thank you for choosing [place] at [place] hope is that these requests will allow you access to exceptional care and in a timely manner. _______________________________________________________________  If you have questions after your visit, please contact our office at [phone] between the hours of 8:30 a.m. and 5:00 p.m. Voicemails left after 4:30 p.m. will not be returned until the following business day. _______________________________________________________________  For prescription refill requests, have your pharmacy contact our office. _______________________________________________________________  Recommendations made by the consultant and any test results will be sent to your referring physician. _______________________________________________________________

## 2018-11-10 ENCOUNTER — Encounter: Payer: Self-pay | Admitting: Cardiovascular Disease

## 2018-11-10 ENCOUNTER — Ambulatory Visit (INDEPENDENT_AMBULATORY_CARE_PROVIDER_SITE_OTHER): Payer: Medicare Other | Admitting: Cardiovascular Disease

## 2018-11-10 VITALS — BP 160/83 | HR 100 | Temp 98.2°F | Ht 66.0 in | Wt 184.8 lb

## 2018-11-10 DIAGNOSIS — R079 Chest pain, unspecified: Secondary | ICD-10-CM

## 2018-11-10 DIAGNOSIS — G4730 Sleep apnea, unspecified: Secondary | ICD-10-CM | POA: Diagnosis not present

## 2018-11-10 DIAGNOSIS — I313 Pericardial effusion (noninflammatory): Secondary | ICD-10-CM | POA: Diagnosis not present

## 2018-11-10 DIAGNOSIS — I10 Essential (primary) hypertension: Secondary | ICD-10-CM

## 2018-11-10 DIAGNOSIS — I3139 Other pericardial effusion (noninflammatory): Secondary | ICD-10-CM

## 2018-11-10 DIAGNOSIS — I5032 Chronic diastolic (congestive) heart failure: Secondary | ICD-10-CM

## 2018-11-10 DIAGNOSIS — R0609 Other forms of dyspnea: Secondary | ICD-10-CM | POA: Diagnosis not present

## 2018-11-10 DIAGNOSIS — I2510 Atherosclerotic heart disease of native coronary artery without angina pectoris: Secondary | ICD-10-CM

## 2018-11-10 DIAGNOSIS — Z720 Tobacco use: Secondary | ICD-10-CM | POA: Diagnosis not present

## 2018-11-10 DIAGNOSIS — R9439 Abnormal result of other cardiovascular function study: Secondary | ICD-10-CM | POA: Diagnosis not present

## 2018-11-10 DIAGNOSIS — E785 Hyperlipidemia, unspecified: Secondary | ICD-10-CM

## 2018-11-10 MED ORDER — LISINOPRIL 20 MG PO TABS: 20.0000 mg | ORAL_TABLET | Freq: Every day | ORAL | 11 refills | Status: DC

## 2018-11-10 NOTE — Patient Instructions (Signed)
Medication Instructions:  Your physician has recommended you make the following change in your medication:  Start Lisinopril 20 mg   If you need a refill on your cardiac medications before your next appointment, please call your pharmacy.   Lab work: NONE   If you have labs (blood work) drawn today and your tests are completely normal, you will receive your results only by: Marland Kitchen MyChart Message (if you have MyChart) OR . A paper copy in the mail If you have any lab test that is abnormal or we need to change your treatment, we will call you to review the results.  Testing/Procedures: NONE   Follow-Up: At [facility], you and your health needs are our priority.  As part of our continuing mission to provide you with exceptional heart care, we have created designated Provider Care Teams.  These Care Teams include your primary Cardiologist (physician) and Advanced Practice Providers (APPs -  Physician Assistants and Nurse Practitioners) who all work together to provide you with the care you need, when you need it. You will need a follow up appointment in 6 months.  Please call our office 2 months in advance to schedule this appointment.  You may see [name], MD or one of the following Advanced Practice Providers on your designated Care Team:   Bernerd Pho, PA-C [facility]) . Ermalinda Barrios, PA-C ([place])  Any Other Special Instructions Will Be Listed Below (If Applicable). Thank you for choosing [place]!

## 2018-11-10 NOTE — Progress Notes (Signed)
SUBJECTIVE: Patient presents for follow-up of chest pain, exertional dyspnea, and chronic diastolic heart failure.  He has a history of tobacco abuse, hypertension, non-small cell lung cancer, and hyperlipidemia.  Nuclear stress test on 03/12/2018 showed an inferior defect extending from the apex to the base suggestive of scar versus soft tissue attenuation.  There were no ischemic territories.  Chest CT on 03/15/2018 demonstrated coronary artery calcifications as well as aortic and mitral annular calcifications.  Echocardiogram on 01/15/2018 showed normal left ventricular systolic function, LVEF 60 to 65%, mild aortic annular calcifications and moderately calcified leaflets, moderate mitral annular calcification, and possible small anteroapical pericardial effusion with signs of chronicity.  He denies chest pain.  His shortness of breath has been worse in the hot and humid weather as temperatures have been in the 90s but symptoms do improve to some degree after using his inhalers.  He continues to smoke.    Review of Systems: As per "subjective", otherwise negative.  No Known Allergies  Current Outpatient Medications  Medication Sig Dispense Refill  . albuterol (PROVENTIL HFA;VENTOLIN HFA) 108 (90 Base) MCG/ACT inhaler USE 2 PUFF BY MOUTH EVERY 6 HOURS AS NEEDED FOR SHORTNESS OF BREATH OR WHEEZING 18 Inhaler 3  . Aspirin Buf,CaCarb-MgCarb-MgO, 81 MG TABS Take by mouth.    Marland Kitchen aspirin EC 81 MG tablet Take 81 mg by mouth daily.    . cloNIDine (CATAPRES) 0.1 MG tablet TAKE 1 TABLET (0.1 MG TOTAL) BY MOUTH 3 (THREE) TIMES DAILY. 270 tablet 1  . dutasteride (AVODART) 0.5 MG capsule Take 1 capsule (0.5 mg total) by mouth daily. 90 capsule 1  . fluticasone (FLONASE) 50 MCG/ACT nasal spray Place 2 sprays into both nostrils daily. 16 g 6  . fluticasone furoate-vilanterol (BREO ELLIPTA) 200-25 MCG/INH AEPB Inhale 1 puff into the lungs daily. 90 each 1  . furosemide (LASIX) 20 MG tablet Take  1 tablet (20 mg total) by mouth daily. 30 tablet 3  . gabapentin (NEURONTIN) 300 MG capsule Take 1 capsule in Am and 2 at night 270 capsule 0  . levothyroxine (SYNTHROID) 75 MCG tablet Take 1 tablet (75 mcg total) by mouth daily. 30 tablet 11  . linagliptin (TRADJENTA) 5 MG TABS tablet Take 1 tablet (5 mg total) by mouth daily. 90 tablet 3  . lisinopril-hydrochlorothiazide (ZESTORETIC) 20-25 MG tablet TAKE 1 TABLET BY MOUTH EVERY DAY 90 tablet 0  . meloxicam (MOBIC) 7.5 MG tablet Take 1 tablet (7.5 mg total) by mouth daily as needed for pain (back pain). 90 tablet 1  . metFORMIN (GLUCOPHAGE-XR) 500 MG 24 hr tablet TAKE 1 TABLET BY MOUTH EVERY DAY WITH BREAKFAST 90 tablet 0  . pravastatin (PRAVACHOL) 40 MG tablet Take 1 tablet (40 mg total) by mouth every 3 (three) days. M-W-F 90 tablet 1  . prochlorperazine (COMPAZINE) 10 MG tablet Take 1 tablet (10 mg total) by mouth every 6 (six) hours as needed for nausea or vomiting. 60 tablet 2  . tamsulosin (FLOMAX) 0.4 MG CAPS capsule TAKE 2 CAPSULES BY MOUTH EVERY DAY 180 capsule 0  . traZODone (DESYREL) 50 MG tablet Take 2 tablets (100 mg total) by mouth at bedtime as needed for sleep. 180 tablet 1  . umeclidinium bromide (INCRUSE ELLIPTA) 62.5 MCG/INH AEPB Inhale 1 puff into the lungs daily. 90 each 1   No current facility-administered medications for this visit.     Past Medical History:  Diagnosis Date  . Adenocarcinoma of lung, right ([place]) 04/18/2016  .  Anxiety   . Arthritis   . Asthma   . CHF (congestive heart failure) ([place])   . COPD (chronic obstructive pulmonary disease) ([place])   . Depression   . Diabetes mellitus without complication (HCC)    no meds  . DM (diabetes mellitus) ([place]) 07/09/2016  . Dyspnea   . History of kidney stones   . Hyperlipidemia   . Hypertension   . Macular degeneration   . Neuropathy   . Non-small cell lung cancer, right ([place]) 04/18/2016  . Prostatitis   . Pulmonary nodule, left 07/16/2016  . Sleep apnea    cpap     Past Surgical History:  Procedure Laterality Date  . CATARACT EXTRACTION, BILATERAL Bilateral   . NO PAST SURGERIES    . PORTACATH PLACEMENT Left 06/13/2016   Procedure: INSERTION PORT-A-CATH;  Surgeon: Aviva Signs, MD;  Location: [facility];  Service: General;  Laterality: Left;  Marland Kitchen VIDEO BRONCHOSCOPY WITH ENDOBRONCHIAL NAVIGATION N/A 05/28/2016   Procedure: VIDEO BRONCHOSCOPY WITH ENDOBRONCHIAL NAVIGATION;  Surgeon: [name], MD;  Location: [facility];  Service: Thoracic;  Laterality: N/A;  . VIDEO BRONCHOSCOPY WITH ENDOBRONCHIAL ULTRASOUND N/A 05/28/2016   Procedure: VIDEO BRONCHOSCOPY WITH ENDOBRONCHIAL ULTRASOUND;  Surgeon: [name], MD;  Location: [place];  Service: Thoracic;  Laterality: N/A;    Social History   Socioeconomic History  . Marital status: Divorced    Spouse name: Not on file  . Number of children: 5  . Years of education: 10  . Highest education level: 10th grade  Occupational History  . Occupation: Retired  Scientific laboratory technician  . Financial resource strain: Not very hard  . Food insecurity    Worry: Never true    Inability: Never true  . Transportation needs    Medical: No    Non-medical: No  Tobacco Use  . Smoking status: Current Every Day Smoker    Packs/day: 0.10    Years: 55.00    Pack years: 5.50    Types: Cigarettes    Start date: 03/11/1961  . Smokeless tobacco: Never Used  . Tobacco comment: smoking 1 to 2 cigarettes per day  Substance and Sexual Activity  . Alcohol use: No  . Drug use: No  . Sexual activity: Yes    Birth control/protection: None  Lifestyle  . Physical activity    Days per week: 0 days    Minutes per session: 0 min  . Stress: To some extent  Relationships  . Social connections    Talks on phone: More than three times a week    Gets together: More than three times a week    Attends religious service: Never    Active member of club or organization: No    Attends meetings of clubs or organizations: Never     Relationship status: Divorced  . Intimate partner violence    Fear of current or ex partner: Not on file    Emotionally abused: Not on file    Physically abused: Not on file    Forced sexual activity: Not on file  Other Topics Concern  . Not on file  Social History Narrative  . Not on file     Vitals:   11/10/18 1331  BP: (!) 160/83  Pulse: 100  Temp: 98.2 F (36.8 C)  Weight: 184 lb 12.8 oz (83.8 kg)  Height: 5\' 6"  (1.676 m)    Wt Readings from Last 3 Encounters:  11/10/18 184 lb 12.8 oz (83.8 kg)  09/09/18 181 lb  8 oz (82.3 kg)  08/09/18 182 lb 6.4 oz (82.7 kg)     PHYSICAL EXAM General: NAD HEENT: Normal. Neck: No JVD, no thyromegaly. Lungs: Diminished sounds throughout, faint rhonchi bilaterally. CV: Regular rate and rhythm, normal S1/S2, no S3/S4, no murmur. No pretibial or periankle edema.    Abdomen: Soft, nontender, no distention.  Neurologic: Alert and oriented.  Psych: Normal affect. Skin: Normal. Musculoskeletal: No gross deformities.    ECG: Reviewed above under Subjective   Labs: Lab Results  Component Value Date/Time   K 4.0 10/07/2018 10:36 AM   BUN 19 10/07/2018 10:36 AM   CREATININE 0.88 10/07/2018 10:36 AM   ALT 13 10/07/2018 10:36 AM   TSH 2.210 10/07/2018 10:36 AM   HGB 13.8 08/02/2018 10:52 AM   HGB 13.2 03/22/2018 11:28 AM     Lipids: Lab Results  Component Value Date/Time   LDLCALC 82 10/07/2018 10:36 AM   CHOL 154 10/07/2018 10:36 AM   TRIG 215 (H) 10/07/2018 10:36 AM   HDL 29 (L) 10/07/2018 10:36 AM       ASSESSMENT AND PLAN:  1. Chronic diastolic heart failure: Currently euvolemic on Lasix 20 mg daily.  LVEF 60 to 65% by echocardiogram in October 2019 as detailed above.  I will aim to control blood pressure.  2. Chest pain and exertional dyspnea: Symptoms likely due to COPD.  Nuclear stress test reviewed above with a defect suggestive of scar versus soft tissue attenuation.  Continue aspirin and statin. He did not  tolerate metoprolol 25 mg twice dailynor Imdur.  If symptoms were to get worse and they are not deemed pulmonary in etiology, I would consider cardiac catheterization.  3. Hypertension: Blood pressure is elevated.  I will increase lisinopril to 40 mg.  Renal function was normal on 10/07/2018.  4. Hyperlipidemia: Lipids reviewed above. Continue pravastatin.  5. Pericardial effusion: Possible small anteroapical effusion which appeared chronic.  6. Sleep apnea: Currently on CPAP.  7.  Tobacco abuse: He currently smokes 1/2 pack of cigarettes daily and has been smoking since age 69.  We talked about the importance of cessation.    Disposition: Follow up 6 months   [name], M.D., F.A.C.C.

## 2018-11-20 ENCOUNTER — Other Ambulatory Visit: Payer: Self-pay | Admitting: Family

## 2018-12-05 ENCOUNTER — Other Ambulatory Visit: Payer: Self-pay | Admitting: Family

## 2018-12-05 DIAGNOSIS — E1142 Type 2 diabetes mellitus with diabetic polyneuropathy: Secondary | ICD-10-CM

## 2018-12-21 ENCOUNTER — Other Ambulatory Visit: Payer: Self-pay | Admitting: Family

## 2018-12-21 DIAGNOSIS — E1142 Type 2 diabetes mellitus with diabetic polyneuropathy: Secondary | ICD-10-CM

## 2019-01-06 ENCOUNTER — Encounter: Payer: Self-pay | Admitting: Family

## 2019-01-06 ENCOUNTER — Ambulatory Visit (INDEPENDENT_AMBULATORY_CARE_PROVIDER_SITE_OTHER): Payer: Medicare Other | Admitting: Family

## 2019-01-06 DIAGNOSIS — I2510 Atherosclerotic heart disease of native coronary artery without angina pectoris: Secondary | ICD-10-CM | POA: Diagnosis not present

## 2019-01-06 DIAGNOSIS — R609 Edema, unspecified: Secondary | ICD-10-CM

## 2019-01-06 DIAGNOSIS — J431 Panlobular emphysema: Secondary | ICD-10-CM

## 2019-01-06 DIAGNOSIS — E785 Hyperlipidemia, unspecified: Secondary | ICD-10-CM

## 2019-01-06 DIAGNOSIS — I509 Heart failure, unspecified: Secondary | ICD-10-CM

## 2019-01-06 DIAGNOSIS — E1169 Type 2 diabetes mellitus with other specified complication: Secondary | ICD-10-CM | POA: Diagnosis not present

## 2019-01-06 DIAGNOSIS — F17200 Nicotine dependence, unspecified, uncomplicated: Secondary | ICD-10-CM | POA: Diagnosis not present

## 2019-01-06 MED ORDER — PRAVASTATIN SODIUM 40 MG PO TABS: 40.0000 mg | ORAL_TABLET | ORAL | 1 refills | Status: DC

## 2019-01-06 MED ORDER — FUROSEMIDE 20 MG PO TABS: 20.0000 mg | ORAL_TABLET | Freq: Two times a day (BID) | ORAL | 3 refills | Status: DC

## 2019-01-06 MED ORDER — LISINOPRIL 40 MG PO TABS: 40.0000 mg | ORAL_TABLET | Freq: Every day | ORAL | 3 refills | Status: DC

## 2019-01-06 NOTE — Progress Notes (Signed)
Virtual Visit via telephone Note Due to COVID-19 pandemic this visit was conducted virtually. This visit type was conducted due to national recommendations for restrictions regarding the COVID-19 Pandemic (e.g. social distancing, sheltering in place) in an effort to limit this patient's exposure and mitigate transmission in our community. All issues noted in this document were discussed and addressed.  A physical exam was not performed with this format.  I connected with [place] on 01/06/19 at 3:34 pm by telephone and verified that I am speaking with the correct person using two identifiers. John [name] is currently located at home and no one is currently with him during visit. The provider, Evelina Dun, FNP is located in their office at time of visit.  I discussed the limitations, risks, security and privacy concerns of performing an evaluation and management service by telephone and the availability of in person appointments. I also discussed with the patient that there may be a patient responsible charge related to this service. The patient expressed understanding and agreed to proceed.   History and Present Illness:  Pt calls the office today with SOB and increased swelling in bilateral extremities. He is followed by his Cardiologists every 6 months for CHF. He is currently taking Lasix 20 mg daily. He does not weigh him daily because his scales broke and he never bought another one. He denies any changes in his diet and denies any chest pain or palpitations.  Shortness of Breath This is a recurrent problem. The current episode started 1 to 4 weeks ago. The problem occurs intermittently (worse in the morning ). Associated symptoms include orthopnea. Pertinent negatives include no chest pain. Associated symptoms comments: Peripheral edema .  Congestive Heart Failure Presents for follow-up visit. Associated symptoms include edema, fatigue and shortness of breath. Pertinent negatives  include no chest pain, chest pressure or nocturia. The symptoms have been stable.      Review of Systems  Constitutional: Positive for fatigue.  Respiratory: Positive for shortness of breath.   Cardiovascular: Positive for orthopnea. Negative for chest pain.  Genitourinary: Negative for nocturia.     Observations/Objective: No SOB or distress noted   Assessment and Plan: 1. Congestive heart failure, unspecified HF chronicity, unspecified heart failure type [facility]) Discussed importance of weighing himself daily, he will buy a scale today and start weighing himself daily. Will increase lasix to 20 mg BID for 5-7 days, then decrease back to 20 mg daily Strict low salt diet Compression hose - furosemide (LASIX) 20 MG tablet; Take 1 tablet (20 mg total) by mouth 2 (two) times daily.  Dispense: 60 tablet; Refill: 3  2. Panlobular emphysema (HCC)  3. Current smoker Smoking cessation discussed  4. Peripheral edema Compression hose  - furosemide (LASIX) 20 MG tablet; Take 1 tablet (20 mg total) by mouth 2 (two) times daily.  Dispense: 60 tablet; Refill: 3   Keep Cardiologists appt Stay hydrated Strict low salt diet Follow up in 1-2 weeks, will need to recheck CMP  I discussed the assessment and treatment plan with the patient. The patient was provided an opportunity to ask questions and all were answered. The patient agreed with the plan and demonstrated an understanding of the instructions.   The patient was advised to call back or seek an in-person evaluation if the symptoms worsen or if the condition fails to improve as anticipated.  The above assessment and management plan was discussed with the patient. The patient verbalized understanding of and has agreed to the management  plan. Patient is aware to call the clinic if symptoms persist or worsen. Patient is aware when to return to the clinic for a follow-up visit. Patient educated on when it is appropriate to go to the emergency  department.   Time call ended:  3:52 pm   I provided 18 minutes of non-face-to-face time during this encounter.    Evelina Dun, FNP

## 2019-01-14 ENCOUNTER — Encounter: Payer: Self-pay | Admitting: Family

## 2019-01-14 ENCOUNTER — Ambulatory Visit (INDEPENDENT_AMBULATORY_CARE_PROVIDER_SITE_OTHER): Payer: Medicare Other | Admitting: Family

## 2019-01-14 DIAGNOSIS — I509 Heart failure, unspecified: Secondary | ICD-10-CM

## 2019-01-14 DIAGNOSIS — I2510 Atherosclerotic heart disease of native coronary artery without angina pectoris: Secondary | ICD-10-CM

## 2019-01-14 DIAGNOSIS — F17200 Nicotine dependence, unspecified, uncomplicated: Secondary | ICD-10-CM | POA: Diagnosis not present

## 2019-01-14 NOTE — Progress Notes (Signed)
   Virtual Visit via telephone Note Due to COVID-19 pandemic this visit was conducted virtually. This visit type was conducted due to national recommendations for restrictions regarding the COVID-19 Pandemic (e.g. social distancing, sheltering in place) in an effort to limit this patient's exposure and mitigate transmission in our community. All issues noted in this document were discussed and addressed.  A physical exam was not performed with this format.  I connected with John [name] on 01/14/19 at 12:15 pm by telephone and verified that I am speaking with the correct person using two identifiers. John [name] is currently located at home and wife is currently with him during visit. The provider, Evelina Dun, FNP is located in their office at time of visit.  I discussed the limitations, risks, security and privacy concerns of performing an evaluation and management service by telephone and the availability of in person appointments. I also discussed with the patient that there may be a patient responsible charge related to this service. The patient expressed understanding and agreed to proceed.   History and Present Illness:  Pt calls the office today to discuss his SOB and CHF. He had a telephone visit 01/06/19 and we increased his lasix to 20 mg BID from 20 mg daily. He states he lost 5 lbs in two days. He reports his SOB has greatly improved. He states the swelling in his legs and stomach has greatly improved. He also reports he has more energy now.  Congestive Heart Failure Presents for follow-up visit. Associated symptoms include edema, fatigue and unexpected weight change. Pertinent negatives include no chest pressure or nocturia. The symptoms have been improving.    Weight log 01/06/2019- 183.6 lb 01/08/19-178.0 lb 01/09/19-177.6 01/10/19-175.9 lb 01/11/19- 178. Lb 01/13/19-177.6 lb  Review of Systems  Constitutional: Positive for fatigue and unexpected weight change.   Genitourinary: Negative for nocturia.     Observations/Objective: No SOB or distress   Assessment and Plan: 1. Congestive heart failure, unspecified HF chronicity, unspecified heart failure type ([place]) Will continue Lasix 20 mg BID since his symptoms are improving  Continue weighing yourself daily Strict low salt diet  Pt will come in and have BMP drawn  Let me know if your SOB worsens  - BMP8+EGFR; Future  2. Current smoker Smoking cessation discussed       I discussed the assessment and treatment plan with the patient. The patient was provided an opportunity to ask questions and all were answered. The patient agreed with the plan and demonstrated an understanding of the instructions.   The patient was advised to call back or seek an in-person evaluation if the symptoms worsen or if the condition fails to improve as anticipated.  The above assessment and management plan was discussed with the patient. The patient verbalized understanding of and has agreed to the management plan. Patient is aware to call the clinic if symptoms persist or worsen. Patient is aware when to return to the clinic for a follow-up visit. Patient educated on when it is appropriate to go to the emergency department.   Time call ended:  12:32 pm   I provided 17 minutes of non-face-to-face time during this encounter.    Evelina Dun, FNP

## 2019-01-19 ENCOUNTER — Other Ambulatory Visit (INDEPENDENT_AMBULATORY_CARE_PROVIDER_SITE_OTHER): Payer: Medicare Other

## 2019-01-19 ENCOUNTER — Other Ambulatory Visit: Payer: Self-pay

## 2019-01-19 DIAGNOSIS — Z23 Encounter for immunization: Secondary | ICD-10-CM

## 2019-01-19 DIAGNOSIS — I509 Heart failure, unspecified: Secondary | ICD-10-CM | POA: Diagnosis not present

## 2019-01-20 LAB — BMP8+EGFR
BUN/Creatinine Ratio: 17 (ref 10–24)
BUN: 14 mg/dL (ref 8–27)
CO2: 25 mmol/L (ref 20–29)
Calcium: 9 mg/dL (ref 8.6–10.2)
Chloride: 101 mmol/L (ref 96–106)
Creatinine, Ser: 0.81 mg/dL (ref 0.76–1.27)
GFR calc Af Amer: 103 mL/min/{1.73_m2} (ref >59)
GFR calc non Af Amer: 89 mL/min/{1.73_m2} (ref >59)
Glucose: 170 mg/dL — ABNORMAL HIGH (ref 65–99)
Potassium: 4.2 mmol/L (ref 3.5–5.2)
Sodium: 139 mmol/L (ref 134–144)

## 2019-01-27 ENCOUNTER — Ambulatory Visit: Payer: Medicare Other

## 2019-02-02 ENCOUNTER — Other Ambulatory Visit: Payer: Self-pay

## 2019-02-02 DIAGNOSIS — Z20828 Contact with and (suspected) exposure to other viral communicable diseases: Secondary | ICD-10-CM | POA: Diagnosis not present

## 2019-02-02 DIAGNOSIS — Z20822 Contact with and (suspected) exposure to covid-19: Secondary | ICD-10-CM

## 2019-02-03 LAB — NOVEL CORONAVIRUS, NAA: SARS-CoV-2, NAA: NOT DETECTED

## 2019-02-07 ENCOUNTER — Telehealth: Payer: Self-pay | Admitting: Family

## 2019-02-07 NOTE — Telephone Encounter (Signed)
Does not say when to return in last phone visit- please advise

## 2019-02-08 NOTE — Telephone Encounter (Signed)
Please make him a follow up in January. If he has any problems he can see me sooner.

## 2019-02-08 NOTE — Telephone Encounter (Signed)
Left patient detailed message to call back and schedule sometime in January

## 2019-02-10 ENCOUNTER — Other Ambulatory Visit: Payer: Self-pay | Admitting: Family

## 2019-02-10 DIAGNOSIS — E1142 Type 2 diabetes mellitus with diabetic polyneuropathy: Secondary | ICD-10-CM

## 2019-02-13 ENCOUNTER — Other Ambulatory Visit: Payer: Self-pay | Admitting: Family

## 2019-02-22 ENCOUNTER — Other Ambulatory Visit: Payer: Self-pay

## 2019-02-22 ENCOUNTER — Inpatient Hospital Stay (HOSPITAL_COMMUNITY): Payer: Medicare Other | Attending: Hematology

## 2019-02-22 DIAGNOSIS — Z452 Encounter for adjustment and management of vascular access device: Secondary | ICD-10-CM | POA: Insufficient documentation

## 2019-02-22 DIAGNOSIS — E039 Hypothyroidism, unspecified: Secondary | ICD-10-CM | POA: Insufficient documentation

## 2019-02-22 DIAGNOSIS — Z85118 Personal history of other malignant neoplasm of bronchus and lung: Secondary | ICD-10-CM | POA: Insufficient documentation

## 2019-02-22 LAB — CBC WITH DIFFERENTIAL/PLATELET
Abs Immature Granulocytes: 0.02 10*3/uL (ref 0.00–0.07)
Basophils Absolute: 0.1 10*3/uL (ref 0.0–0.1)
Basophils Relative: 1 %
Eosinophils Absolute: 0.2 10*3/uL (ref 0.0–0.5)
Eosinophils Relative: 2 %
HCT: 45.6 % (ref 39.0–52.0)
Hemoglobin: 14.3 g/dL (ref 13.0–17.0)
Immature Granulocytes: 0 %
Lymphocytes Relative: 8 %
Lymphs Abs: 0.7 10*3/uL (ref 0.7–4.0)
MCH: 31.4 pg (ref 26.0–34.0)
MCHC: 31.4 g/dL (ref 30.0–36.0)
MCV: 100.2 fL — ABNORMAL HIGH (ref 80.0–100.0)
Monocytes Absolute: 0.9 10*3/uL (ref 0.1–1.0)
Monocytes Relative: 11 %
Neutro Abs: 6.3 10*3/uL (ref 1.7–7.7)
Neutrophils Relative %: 78 %
Platelets: 278 10*3/uL (ref 150–400)
RBC: 4.55 MIL/uL (ref 4.22–5.81)
RDW: 14.6 % (ref 11.5–15.5)
WBC: 8.1 10*3/uL (ref 4.0–10.5)
nRBC: 0 % (ref 0.0–0.2)

## 2019-02-22 LAB — COMPREHENSIVE METABOLIC PANEL
ALT: 15 U/L (ref 0–44)
AST: 17 U/L (ref 15–41)
Albumin: 3.9 g/dL (ref 3.5–5.0)
Alkaline Phosphatase: 103 U/L (ref 38–126)
Anion gap: 10 (ref 5–15)
BUN: 17 mg/dL (ref 8–23)
CO2: 28 mmol/L (ref 22–32)
Calcium: 9.2 mg/dL (ref 8.9–10.3)
Chloride: 100 mmol/L (ref 98–111)
Creatinine, Ser: 0.88 mg/dL (ref 0.61–1.24)
GFR calc Af Amer: >60 mL/min (ref >60)
GFR calc non Af Amer: >60 mL/min (ref >60)
Glucose, Bld: 168 mg/dL — ABNORMAL HIGH (ref 70–99)
Potassium: 4.2 mmol/L (ref 3.5–5.1)
Sodium: 138 mmol/L (ref 135–145)
Total Bilirubin: 0.5 mg/dL (ref 0.3–1.2)
Total Protein: 7.3 g/dL (ref 6.5–8.1)

## 2019-02-22 LAB — TSH: TSH: 1.104 u[IU]/mL (ref 0.350–4.500)

## 2019-02-22 MED ORDER — SODIUM CHLORIDE 0.9% FLUSH
20.0000 mL | Freq: Once | INTRAVENOUS | Status: AC
  Administered 2019-02-22: 11:00:00 20 mL via INTRAVENOUS

## 2019-02-22 MED ORDER — HEPARIN SOD (PORK) LOCK FLUSH 100 UNIT/ML IV SOLN
500.0000 [IU] | Freq: Once | INTRAVENOUS | Status: AC
  Administered 2019-02-22: 500 [IU] via INTRAVENOUS

## 2019-02-22 NOTE — Patient Instructions (Signed)
[place] at [facility]  Discharge Instructions:  Port flushed today. _______________________________________________________________  Thank you for choosing [place] at [facility] to provide your oncology and hematology care.  To afford each patient quality time with our providers, please arrive at least 15 minutes before your scheduled appointment.  You need to re-schedule your appointment if you arrive 10 or more minutes late.  We strive to give you quality time with our providers, and arriving late affects you and other patients whose appointments are after yours.  Also, if you no show three or more times for appointments you may be dismissed from the clinic.  Again, thank you for choosing [place] at [place] hope is that these requests will allow you access to exceptional care and in a timely manner. _______________________________________________________________  If you have questions after your visit, please contact our office at [phone] between the hours of 8:30 a.m. and 5:00 p.m. Voicemails left after 4:30 p.m. will not be returned until the following business day. _______________________________________________________________  For prescription refill requests, have your pharmacy contact our office. _______________________________________________________________  Recommendations made by the consultant and any test results will be sent to your referring physician. _______________________________________________________________

## 2019-02-22 NOTE — Progress Notes (Signed)
John [name] presents today for portacath flush. VSS. Left upper chest port accessed and flushed per protocol. See MAR and IV assessment for details. Port deaccessed and bandaid applied. Site clean and intact. Discharged in satisfactory condition with follow up instructions.

## 2019-03-01 ENCOUNTER — Encounter: Payer: Self-pay | Admitting: Neurology

## 2019-03-07 ENCOUNTER — Ambulatory Visit (INDEPENDENT_AMBULATORY_CARE_PROVIDER_SITE_OTHER): Payer: Medicare Other | Admitting: Adult Health

## 2019-03-07 ENCOUNTER — Encounter: Payer: Self-pay | Admitting: Adult Health

## 2019-03-07 ENCOUNTER — Other Ambulatory Visit: Payer: Self-pay

## 2019-03-07 VITALS — BP 140/82 | HR 119 | Temp 97.5°F | Ht 66.0 in | Wt 181.8 lb

## 2019-03-07 DIAGNOSIS — Z9989 Dependence on other enabling machines and devices: Secondary | ICD-10-CM

## 2019-03-07 DIAGNOSIS — I2510 Atherosclerotic heart disease of native coronary artery without angina pectoris: Secondary | ICD-10-CM | POA: Diagnosis not present

## 2019-03-07 DIAGNOSIS — G4733 Obstructive sleep apnea (adult) (pediatric): Secondary | ICD-10-CM | POA: Diagnosis not present

## 2019-03-07 NOTE — Patient Instructions (Signed)
Continue using CPAP nightly and greater than 4 hours each night °If your symptoms worsen or you develop new symptoms please let us know.  ° °

## 2019-03-07 NOTE — Progress Notes (Signed)
PATIENT: John [name] DOB: 03-10-1946  REASON FOR VISIT: follow up HISTORY FROM: patient  HISTORY OF PRESENT ILLNESS: Today 03/07/19:  Mr. Weygandt is a 73 year old male with a history of obstructive sleep apnea on CPAP.  His download indicates that he use his machine 29 out of 30 days for compliance of 97%.  He uses machine greater than 4 hours 17 days for compliance of 57%.  On average he uses his machine 4 hours and 9 minutes.  His residual AHI is 1 on 13 cm of water with EPR 3.  His leak in the 95th percentile is 57.4 L/min.  He states that he does feel the mask leaking.  It often wakes him up during the night.  He has switched out his supplies but continues to have a leak.  He returns today for an evaluation.  HISTORY I reviewed his [employer] compliance data from 07/27/2018 through 08/25/2018 which is a total of 30 days, during which time he used his machine every night with percent used days greater than 4 hours at 100%, indicating superb compliance with an average usage of 6 hours, average AHI 2.3/h, 95th percentile of pressure of 12.4 cm with a range of 7 cm to 15 cm with EPR, leak on the higher side with a 95th percentile at 26.9 L/min.  REVIEW OF SYSTEMS: Out of a complete 14 system review of symptoms, the patient complains only of the following symptoms, and all other reviewed systems are negative.  See HPI  ALLERGIES: No Known Allergies  HOME MEDICATIONS: Outpatient Medications Prior to Visit  Medication Sig Dispense Refill  . albuterol (PROVENTIL HFA;VENTOLIN HFA) 108 (90 Base) MCG/ACT inhaler USE 2 PUFF BY MOUTH EVERY 6 HOURS AS NEEDED FOR SHORTNESS OF BREATH OR WHEEZING 18 Inhaler 3  . Aspirin Buf,CaCarb-MgCarb-MgO, 81 MG TABS Take by mouth.    Marland Kitchen aspirin EC 81 MG tablet Take 81 mg by mouth daily.    . cloNIDine (CATAPRES) 0.1 MG tablet TAKE 1 TABLET (0.1 MG TOTAL) BY MOUTH 3 (THREE) TIMES DAILY. 270 tablet 1  . dutasteride (AVODART) 0.5 MG capsule Take 1 capsule (0.5 mg  total) by mouth daily. 90 capsule 1  . fluticasone (FLONASE) 50 MCG/ACT nasal spray Place 2 sprays into both nostrils daily. 16 g 6  . fluticasone furoate-vilanterol (BREO ELLIPTA) 200-25 MCG/INH AEPB Inhale 1 puff into the lungs daily. 90 each 1  . furosemide (LASIX) 20 MG tablet Take 1 tablet (20 mg total) by mouth 2 (two) times daily. 60 tablet 3  . gabapentin (NEURONTIN) 300 MG capsule TAKE 1 CAPSULE BY MOUTH IN THE MORNING AND 2 CAPSULES AT NIGHT 270 capsule 0  . linagliptin (TRADJENTA) 5 MG TABS tablet Take 1 tablet (5 mg total) by mouth daily. 90 tablet 3  . lisinopril (ZESTRIL) 40 MG tablet Take 1 tablet (40 mg total) by mouth daily. 90 tablet 3  . meloxicam (MOBIC) 7.5 MG tablet Take 1 tablet (7.5 mg total) by mouth daily as needed for pain (back pain). 90 tablet 1  . metFORMIN (GLUCOPHAGE-XR) 500 MG 24 hr tablet TAKE 1 TABLET BY MOUTH EVERY DAY WITH BREAKFAST 90 tablet 0  . pravastatin (PRAVACHOL) 40 MG tablet Take 1 tablet (40 mg total) by mouth every 3 (three) days. M-W-F 90 tablet 1  . prochlorperazine (COMPAZINE) 10 MG tablet Take 1 tablet (10 mg total) by mouth every 6 (six) hours as needed for nausea or vomiting. 60 tablet 2  . tamsulosin (FLOMAX) 0.4 MG CAPS  capsule TAKE 2 CAPSULES BY MOUTH EVERY DAY 180 capsule 0  . umeclidinium bromide (INCRUSE ELLIPTA) 62.5 MCG/INH AEPB Inhale 1 puff into the lungs daily. 90 each 1  . traZODone (DESYREL) 50 MG tablet Take 2 tablets (100 mg total) by mouth at bedtime as needed for sleep. (Patient not taking: Reported on 03/07/2019) 180 tablet 1  . levothyroxine (SYNTHROID) 75 MCG tablet Take 1 tablet (75 mcg total) by mouth daily. 30 tablet 11   No facility-administered medications prior to visit.     PAST MEDICAL HISTORY: Past Medical History:  Diagnosis Date  . Adenocarcinoma of lung, right ([place]) 04/18/2016  . Anxiety   . Arthritis   . Asthma   . CHF (congestive heart failure) ([place])   . COPD (chronic obstructive pulmonary disease) ([place])    . Depression   . Diabetes mellitus without complication ([facility])    no meds  . DM (diabetes mellitus) ([place]) 07/09/2016  . Dyspnea   . History of kidney stones   . Hyperlipidemia   . Hypertension   . Macular degeneration   . Neuropathy   . Non-small cell lung cancer, right ([place]) 04/18/2016  . Prostatitis   . Pulmonary nodule, left 07/16/2016  . Sleep apnea    cpap    PAST SURGICAL HISTORY: Past Surgical History:  Procedure Laterality Date  . CATARACT EXTRACTION, BILATERAL Bilateral   . NO PAST SURGERIES    . PORTACATH PLACEMENT Left 06/13/2016   Procedure: INSERTION PORT-A-CATH;  Surgeon: Aviva Signs, MD;  Location: [facility];  Service: General;  Laterality: Left;  Marland Kitchen VIDEO BRONCHOSCOPY WITH ENDOBRONCHIAL NAVIGATION N/A 05/28/2016   Procedure: VIDEO BRONCHOSCOPY WITH ENDOBRONCHIAL NAVIGATION;  Surgeon: [name], MD;  Location: [place];  Service: Thoracic;  Laterality: N/A;  . VIDEO BRONCHOSCOPY WITH ENDOBRONCHIAL ULTRASOUND N/A 05/28/2016   Procedure: VIDEO BRONCHOSCOPY WITH ENDOBRONCHIAL ULTRASOUND;  Surgeon: [name], MD;  Location: [facility];  Service: Thoracic;  Laterality: N/A;    FAMILY HISTORY: Family History  Problem Relation Age of Onset  . Hypertension Mother   . Diabetes Father   . Heart disease Father   . Stroke Father   . Hypertension Sister     SOCIAL HISTORY: Social History   Socioeconomic History  . Marital status: Divorced    Spouse name: Not on file  . Number of children: 5  . Years of education: 10  . Highest education level: 10th grade  Occupational History  . Occupation: Retired  Scientific laboratory technician  . Financial resource strain: Not very hard  . Food insecurity    Worry: Never true    Inability: Never true  . Transportation needs    Medical: No    Non-medical: No  Tobacco Use  . Smoking status: Current Every Day Smoker    Packs/day: 0.10    Years: 55.00    Pack years: 5.50    Types: Cigarettes    Start date: 03/11/1961  . Smokeless  tobacco: Never Used  . Tobacco comment: smoking 1 to 2 cigarettes per day  Substance and Sexual Activity  . Alcohol use: No  . Drug use: No  . Sexual activity: Yes    Birth control/protection: None  Lifestyle  . Physical activity    Days per week: 0 days    Minutes per session: 0 min  . Stress: To some extent  Relationships  . Social connections    Talks on phone: More than three times a week    Gets together: More than  three times a week    Attends religious service: Never    Active member of club or organization: No    Attends meetings of clubs or organizations: Never    Relationship status: Divorced  . Intimate partner violence    Fear of current or ex partner: Not on file    Emotionally abused: Not on file    Physically abused: Not on file    Forced sexual activity: Not on file  Other Topics Concern  . Not on file  Social History Narrative  . Not on file      PHYSICAL EXAM  Vitals:   03/07/19 1307  BP: 140/82  Pulse: (!) 119  Temp: (!) 97.5 F (36.4 C)  Weight: 181 lb 12.8 oz (82.5 kg)  Height: 5\' 6"  (1.676 m)   Body mass index is 29.34 kg/m.  Generalized: Well developed, in no acute distress  Chest: Lungs clear to auscultation bilaterally  Neurological examination  Mentation: Alert oriented to time, place, history taking. Follows all commands speech and language fluent Cranial nerve II-XII: Extraocular movements were full, visual field were full on confrontational test Head turning and shoulder shrug  were normal and symmetric. Motor: The motor testing reveals 5 over 5 strength of all 4 extremities. Good symmetric motor tone is noted throughout.  Sensory: Sensory testing is intact to soft touch on all 4 extremities. No evidence of extinction is noted.  Gait and station: Gait is normal.    DIAGNOSTIC DATA (LABS, IMAGING, TESTING) - I reviewed patient records, labs, notes, testing and imaging myself where available.  Lab Results  Component Value Date    WBC 8.1 02/22/2019   HGB 14.3 02/22/2019   HCT 45.6 02/22/2019   MCV 100.2 (H) 02/22/2019   PLT 278 02/22/2019      Component Value Date/Time   NA 138 02/22/2019 1144   NA 139 01/19/2019 1202   K 4.2 02/22/2019 1144   CL 100 02/22/2019 1144   CO2 28 02/22/2019 1144   GLUCOSE 168 (H) 02/22/2019 1144   BUN 17 02/22/2019 1144   BUN 14 01/19/2019 1202   CREATININE 0.88 02/22/2019 1144   CALCIUM 9.2 02/22/2019 1144   PROT 7.3 02/22/2019 1144   PROT 7.0 10/07/2018 1036   ALBUMIN 3.9 02/22/2019 1144   ALBUMIN 4.4 10/07/2018 1036   AST 17 02/22/2019 1144   ALT 15 02/22/2019 1144   ALKPHOS 103 02/22/2019 1144   BILITOT 0.5 02/22/2019 1144   BILITOT <0.2 10/07/2018 1036   GFRNONAA >60 02/22/2019 1144   GFRAA >60 02/22/2019 1144   Lab Results  Component Value Date   CHOL 154 10/07/2018   HDL 29 (L) 10/07/2018   LDLCALC 82 10/07/2018   TRIG 215 (H) 10/07/2018   CHOLHDL 5.3 (H) 10/07/2018   Lab Results  Component Value Date   HGBA1C 7.0 (H) 10/07/2018    Lab Results  Component Value Date   TSH 1.104 02/22/2019      ASSESSMENT AND PLAN 73 y.o. year old male  has a past medical history of Adenocarcinoma of lung, right ([place]) (04/18/2016), Anxiety, Arthritis, Asthma, CHF (congestive heart failure) ([place]), COPD (chronic obstructive pulmonary disease) ([place]), Depression, Diabetes mellitus without complication ([place]), DM (diabetes mellitus) ([place]) (07/09/2016), Dyspnea, History of kidney stones, Hyperlipidemia, Hypertension, Macular degeneration, Neuropathy, Non-small cell lung cancer, right ([place]) (04/18/2016), Prostatitis, Pulmonary nodule, left (07/16/2016), and Sleep apnea. here with :  1. Obstructive sleep apnea on CPAP  The patient's CPAP download shows suboptimal compliance.  The  patient also has a high leak with the mask.  He will be sent for mask refitting.  He is encouraged to continue using CPAP nightly and greater than 4 hours each night.  He is advised that if his symptoms  worsen or he develops new symptoms he should let us know.  He will follow-up in 1 year or sooner if needed.    I spent 15 minutes with the patient. 50% of this time was spent reviewing plan of reviewing plan of care  Ward Givens, MSN, NP-C 03/07/2019, 1:11 PM [facility] [address], [place], [place] [address] [phone]

## 2019-03-10 ENCOUNTER — Ambulatory Visit (HOSPITAL_COMMUNITY)
Admission: RE | Admit: 2019-03-10 | Discharge: 2019-03-10 | Disposition: A | Discharge status: Home / Self Care | Payer: Medicare Other | Source: Ambulatory Visit | Attending: Hematology | Admitting: Hematology

## 2019-03-10 ENCOUNTER — Other Ambulatory Visit: Payer: Self-pay

## 2019-03-10 DIAGNOSIS — C3490 Malignant neoplasm of unspecified part of unspecified bronchus or lung: Secondary | ICD-10-CM | POA: Diagnosis not present

## 2019-03-10 DIAGNOSIS — C3491 Malignant neoplasm of unspecified part of right bronchus or lung: Secondary | ICD-10-CM | POA: Diagnosis not present

## 2019-03-10 MED ORDER — IOHEXOL 300 MG/ML  SOLN
75.0000 mL | Freq: Once | INTRAMUSCULAR | Status: AC | PRN
  Administered 2019-03-10: 75 mL via INTRAVENOUS

## 2019-03-14 ENCOUNTER — Other Ambulatory Visit: Payer: Self-pay

## 2019-03-14 ENCOUNTER — Inpatient Hospital Stay (HOSPITAL_COMMUNITY): Payer: Medicare Other | Attending: Hematology | Admitting: Hematology

## 2019-03-14 ENCOUNTER — Encounter (HOSPITAL_COMMUNITY): Payer: Self-pay | Admitting: Hematology

## 2019-03-14 VITALS — BP 150/76 | HR 115 | Temp 96.8°F | Resp 18 | Wt 172.0 lb

## 2019-03-14 DIAGNOSIS — Z7982 Long term (current) use of aspirin: Secondary | ICD-10-CM | POA: Insufficient documentation

## 2019-03-14 DIAGNOSIS — H538 Other visual disturbances: Secondary | ICD-10-CM | POA: Diagnosis not present

## 2019-03-14 DIAGNOSIS — I10 Essential (primary) hypertension: Secondary | ICD-10-CM | POA: Diagnosis not present

## 2019-03-14 DIAGNOSIS — Z7984 Long term (current) use of oral hypoglycemic drugs: Secondary | ICD-10-CM | POA: Diagnosis not present

## 2019-03-14 DIAGNOSIS — Z833 Family history of diabetes mellitus: Secondary | ICD-10-CM | POA: Diagnosis not present

## 2019-03-14 DIAGNOSIS — F17210 Nicotine dependence, cigarettes, uncomplicated: Secondary | ICD-10-CM | POA: Diagnosis not present

## 2019-03-14 DIAGNOSIS — R918 Other nonspecific abnormal finding of lung field: Secondary | ICD-10-CM | POA: Diagnosis not present

## 2019-03-14 DIAGNOSIS — E039 Hypothyroidism, unspecified: Secondary | ICD-10-CM | POA: Diagnosis not present

## 2019-03-14 DIAGNOSIS — R110 Nausea: Secondary | ICD-10-CM | POA: Insufficient documentation

## 2019-03-14 DIAGNOSIS — Z79899 Other long term (current) drug therapy: Secondary | ICD-10-CM | POA: Insufficient documentation

## 2019-03-14 DIAGNOSIS — C3491 Malignant neoplasm of unspecified part of right bronchus or lung: Secondary | ICD-10-CM | POA: Diagnosis not present

## 2019-03-14 DIAGNOSIS — Z7951 Long term (current) use of inhaled steroids: Secondary | ICD-10-CM | POA: Insufficient documentation

## 2019-03-14 DIAGNOSIS — Z923 Personal history of irradiation: Secondary | ICD-10-CM | POA: Insufficient documentation

## 2019-03-14 DIAGNOSIS — Z8249 Family history of ischemic heart disease and other diseases of the circulatory system: Secondary | ICD-10-CM | POA: Insufficient documentation

## 2019-03-14 DIAGNOSIS — Z85118 Personal history of other malignant neoplasm of bronchus and lung: Secondary | ICD-10-CM | POA: Insufficient documentation

## 2019-03-14 DIAGNOSIS — Z9221 Personal history of antineoplastic chemotherapy: Secondary | ICD-10-CM | POA: Diagnosis not present

## 2019-03-14 DIAGNOSIS — Z791 Long term (current) use of non-steroidal anti-inflammatories (NSAID): Secondary | ICD-10-CM | POA: Diagnosis not present

## 2019-03-14 DIAGNOSIS — R112 Nausea with vomiting, unspecified: Secondary | ICD-10-CM

## 2019-03-14 DIAGNOSIS — J449 Chronic obstructive pulmonary disease, unspecified: Secondary | ICD-10-CM | POA: Insufficient documentation

## 2019-03-14 DIAGNOSIS — E785 Hyperlipidemia, unspecified: Secondary | ICD-10-CM | POA: Diagnosis not present

## 2019-03-14 DIAGNOSIS — E119 Type 2 diabetes mellitus without complications: Secondary | ICD-10-CM | POA: Insufficient documentation

## 2019-03-14 NOTE — Progress Notes (Signed)
[place], [place] [id number]   CLINIC:  Medical Oncology/Hematology  PCP:  [name], [place] [address] [phone]   REASON FOR VISIT:  Follow-up for NSCLC  CURRENT THERAPY: Clinical Surveillance   BRIEF ONCOLOGIC HISTORY:  Oncology History  Non-small cell lung cancer, right ([facility])  04/17/2016 Imaging   CT chest- 1. Examination is positive for bilateral upper lobe pulmonary lesions suspicious for malignancy. Further investigation with PET-CT and tissue sampling is advised. 2. Borderline enlarged paratracheal lymph nodes and right hilar nodes. Cannot rule out metastatic adenopathy. Attention to these areas on PET-CT is advised. 3. Emphysema 4. Aortic atherosclerosis and multi vessel coronary artery calcification.   04/18/2016 Initial Diagnosis   Adenocarcinoma of lung, right ([place])   04/24/2016 PET scan   1. Adjacent hypermetabolic nodules in the RIGHT upper lobe consistent bronchogenic carcinoma. 2. Suspicion of ipsilateral hilar and paratracheal nodal metastasis. 3. Nodule in the LEFT upper lobe with suspicious morphology has a relatively low metabolic activity for size. Favor synchronous primary bronchogenic carcinoma. 4. No metabolic activity associated with a small LEFT lower lobe pulmonary nodule. Recommend attention on follow-up.   05/06/2016 Imaging   MRI brain- Negative for metastatic disease.   05/28/2016 Procedure   1. Electromagnetic navigational bronchoscopy with brushings, needle     aspirations and biopsies of bilateral upper lobe nodules. 2. Endobronchial ultrasound with mediastinal and hilar lymph node     aspirations. By Dr. [name]   06/03/2016 Pathology Results   Diagnosis 1. Lung, biopsy, Right upper lobe - BLOOD AND BENIGN BRONCHIAL AND LUNG TISSUE. 2. Lung, biopsy, Left upper lobe - BLOOD AND BENIGN BRONCHIAL AND LUNG TISSUE.   06/03/2016 Pathology Results   Diagnosis TRANSBRONCHIAL NEEDLE ASPIRATION, NAVIGATION, RUL (SPECIMEN 5 OF 7, COLLECTED 05/28/16): MALIGNANT CELLS CONSISTENT WITH NON-SMALL CELL CARCINOMA Comment There are limited tumor cells in the cell block (insufficient for molecular testing). TTF-1 is positive in a few of the atypical cells. They appear negative for synaptophysin and cytokeratin 5/6. Overall there is limited tumor, but a lung adenocarcinoma is slightly favored. Dr. [name] has reviewed the case. The case was called to Dr. [name] on 06/03/2016.   06/13/2016 Procedure   Port placed by Dr. Arnoldo Morale   06/25/2016 - 08/15/2016 Chemotherapy   The patient had palonosetron (ALOXI) injection 0.25 mg, 0.25 mg, Intravenous,  Once, 6 of 6 cycles  CARBOplatin (PARAPLATIN) 220 mg in sodium chloride 0.9 % 250 mL chemo infusion, 220 mg (100 % of original dose 215.8 mg), Intravenous,  Once, 6 of 6 cycles Dose modification:   (original dose 215.8 mg, Cycle 1),   (original dose 215.8 mg, Cycle 5),   (original dose 215.8 mg, Cycle 6),   (original dose 215.8 mg, Cycle 2),   (original dose 215.8 mg, Cycle 3),   (original dose 215.8 mg, Cycle 4)  PACLitaxel (TAXOL) 90 mg in dextrose 5 % 250 mL chemo infusion ( for chemotherapy treatment.     06/25/2016 - 08/20/2016 Radiation Therapy   Radiation in [place], [place] by Dr. Quitman Livings.  R lung to 6600 cGy   07/09/2016 - 07/10/2016 Hospital Admission   Admit date: 07/09/2016  Admission diagnosis: Fever and chills Additional comments: Negative work-up, discharged with course of Levaquin   07/09/2016 Treatment Plan Change   Treatment deferred x 1 week due to hospitalization    08/05/2016 - 08/06/2016 Hospital Admission   Admit date: 08/05/2016 Admission diagnosis: Joint pain Additional comments: Treated  with steroids with symptomatic improvement   08/25/2016 Imaging   MRI brain- Negative for intracranial metastasis on this motion degraded examination.  Stable mild-to-moderate chronic small vessel ischemic  disease.   09/18/2016 PET scan   1. Partial metabolic response. Right upper lobe 2.6 cm hypermetabolic pulmonary nodule, decreased in size and metabolism. Mildly hypermetabolic right hilar nodal metastasis, decreased in metabolism. Right paratracheal lymph node is no longer hypermetabolic. No new or progressive metastatic disease. 2. Apical left upper lobe 0.9 cm solid pulmonary nodule, slightly decreased in size, with no significant metabolism. The change in size could indicate a neoplastic etiology for this nodule. 3. Additional subcentimeter pulmonary nodules in the left lung are stable and below PET resolution . 4. Additional findings include aortic atherosclerosis, coronary atherosclerosis, moderate emphysema, stable small pericardial effusion/thickening and mild sigmoid diverticulosis.   01/13/2017 Imaging   CT chest with contrast IMPRESSION: 1. No change in size of the hypermetabolic nodular thickening in the RIGHT upper lobe. 2. LEFT upper lobe spiculated nodule is stable. 3. Rounded LEFT lower lobe pulmonary nodule is stable. 4. No evidence of lung cancer progression.   05/08/2017 Adverse Reaction   Development of Hypothyroidism- likely secondary to immunotherapy.  Levothyroxine therapy initiated.       CANCER STAGING: Cancer Staging Non-small cell lung cancer, right [facility]) Staging form: Lung, AJCC 8th Edition - Clinical stage from 06/05/2016: Stage IIIA (cT1b(2), cN2, cM0) - Signed by Baird Cancer, PA-C on 08/21/2016    INTERVAL HISTORY:  Mr. Eisenberg 73 y.o. male seen for follow-up of lung cancer.  Denies any recent infections.  Denies any change in his baseline cough or hemoptysis.  Reports nausea over the last 4 to 6 months.  Denies any vomiting.  Does not have headaches.  Has blurred vision.  Decreased appetite secondary to nausea.  Lost 8 pounds.  He is taking Compazine 1 tablet daily which is not helping completely.  Denies any recent hospitalizations.  Leg  swellings have been stable.  Constipation is also stable.  Numbness in the hands and feet is present.  Appetite and energy levels reported as 25%.    REVIEW OF SYSTEMS:  Review of Systems  Constitutional: Positive for unexpected weight change.  Gastrointestinal: Positive for constipation and nausea.  Neurological: Positive for numbness.  Psychiatric/Behavioral: Positive for sleep disturbance.  All other systems reviewed and are negative.    PAST MEDICAL/SURGICAL HISTORY:  Past Medical History:  Diagnosis Date  . Adenocarcinoma of lung, right ([facility]) 04/18/2016  . Anxiety   . Arthritis   . Asthma   . CHF (congestive heart failure) ([place])   . COPD (chronic obstructive pulmonary disease) ([place] [facility])   . Depression   . Diabetes mellitus without complication (H[facility])    no meds  . DM (diabetes mellitus) ([facility]) 07/09/2016  . Dyspnea   . History of kidney stones   . Hyperlipidemia   . Hypertension   . Macular degeneration   . Neuropathy   . Non-small cell lung cancer, right ([facility]) 04/18/2016  . Prostatitis   . Pulmonary nodule, left 07/16/2016  . Sleep apnea    cpap   Past Surgical History:  Procedure Laterality Date  . CATARACT EXTRACTION, BILATERAL Bilateral   . NO PAST SURGERIES    . PORTACATH PLACEMENT Left 06/13/2016   Procedure: INSERTION PORT-A-CATH;  Surgeon: Aviva Signs, MD;  Location: [facility];  Service: General;  Laterality: Left;  Marland Kitchen VIDEO BRONCHOSCOPY WITH ENDOBRONCHIAL NAVIGATION N/A 05/28/2016   Procedure: VIDEO BRONCHOSCOPY WITH ENDOBRONCHIAL NAVIGATION;  Surgeon: [name], MD;  Location: [facility];  Service: Thoracic;  Laterality: N/A;  . VIDEO BRONCHOSCOPY WITH ENDOBRONCHIAL ULTRASOUND N/A 05/28/2016   Procedure: VIDEO BRONCHOSCOPY WITH ENDOBRONCHIAL ULTRASOUND;  Surgeon: [name], MD;  Location: [facility];  Service: Thoracic;  Laterality: N/A;     SOCIAL HISTORY:  Social History   Socioeconomic History  . Marital status: Divorced    Spouse name: Not on file   . Number of children: 5  . Years of education: 10  . Highest education level: 10th grade  Occupational History  . Occupation: Retired  Scientific laboratory technician  . Financial resource strain: Not very hard  . Food insecurity    Worry: Never true    Inability: Never true  . Transportation needs    Medical: No    Non-medical: No  Tobacco Use  . Smoking status: Current Every Day Smoker    Packs/day: 0.10    Years: 55.00    Pack years: 5.50    Types: Cigarettes    Start date: 03/11/1961  . Smokeless tobacco: Never Used  . Tobacco comment: smoking 1 to 2 cigarettes per day  Substance and Sexual Activity  . Alcohol use: No  . Drug use: No  . Sexual activity: Yes    Birth control/protection: None  Lifestyle  . Physical activity    Days per week: 0 days    Minutes per session: 0 min  . Stress: To some extent  Relationships  . Social connections    Talks on phone: More than three times a week    Gets together: More than three times a week    Attends religious service: Never    Active member of club or organization: No    Attends meetings of clubs or organizations: Never    Relationship status: Divorced  . Intimate partner violence    Fear of current or ex partner: Not on file    Emotionally abused: Not on file    Physically abused: Not on file    Forced sexual activity: Not on file  Other Topics Concern  . Not on file  Social History Narrative  . Not on file    FAMILY HISTORY:  Family History  Problem Relation Age of Onset  . Hypertension Mother   . Diabetes Father   . Heart disease Father   . Stroke Father   . Hypertension Sister     CURRENT MEDICATIONS:  Outpatient Encounter Medications as of 03/14/2019  Medication Sig Note  . aspirin EC 81 MG tablet Take 81 mg by mouth daily.   . cloNIDine (CATAPRES) 0.1 MG tablet TAKE 1 TABLET (0.1 MG TOTAL) BY MOUTH 3 (THREE) TIMES DAILY.   Marland Kitchen dutasteride (AVODART) 0.5 MG capsule Take 1 capsule (0.5 mg total) by mouth daily.   .  fluticasone furoate-vilanterol (BREO ELLIPTA) 200-25 MCG/INH AEPB Inhale 1 puff into the lungs daily.   . furosemide (LASIX) 20 MG tablet Take 1 tablet (20 mg total) by mouth 2 (two) times daily.   Marland Kitchen gabapentin (NEURONTIN) 300 MG capsule TAKE 1 CAPSULE BY MOUTH IN THE MORNING AND 2 CAPSULES AT NIGHT   . linagliptin (TRADJENTA) 5 MG TABS tablet Take 1 tablet (5 mg total) by mouth daily.   Marland Kitchen lisinopril (ZESTRIL) 40 MG tablet Take 1 tablet (40 mg total) by mouth daily.   . metFORMIN (GLUCOPHAGE-XR) 500 MG 24 hr tablet TAKE 1 TABLET BY MOUTH EVERY DAY WITH BREAKFAST   . pravastatin (PRAVACHOL) 40 MG tablet  Take 1 tablet (40 mg total) by mouth every 3 (three) days. M-W-F   . tamsulosin (FLOMAX) 0.4 MG CAPS capsule TAKE 2 CAPSULES BY MOUTH EVERY DAY   . umeclidinium bromide (INCRUSE ELLIPTA) 62.5 MCG/INH AEPB Inhale 1 puff into the lungs daily.   . [DISCONTINUED] Aspirin Buf,CaCarb-MgCarb-MgO, 81 MG TABS Take by mouth.   Marland Kitchen albuterol (PROVENTIL HFA;VENTOLIN HFA) 108 (90 Base) MCG/ACT inhaler USE 2 PUFF BY MOUTH EVERY 6 HOURS AS NEEDED FOR SHORTNESS OF BREATH OR WHEEZING (Patient not taking: Reported on 03/14/2019)   . fluticasone (FLONASE) 50 MCG/ACT nasal spray Place 2 sprays into both nostrils daily. (Patient not taking: Reported on 03/14/2019) 12/05/2016: As needed  . meloxicam (MOBIC) 7.5 MG tablet Take 1 tablet (7.5 mg total) by mouth daily as needed for pain (back pain). (Patient not taking: Reported on 03/14/2019)   . prochlorperazine (COMPAZINE) 10 MG tablet Take 1 tablet (10 mg total) by mouth every 6 (six) hours as needed for nausea or vomiting. (Patient not taking: Reported on 03/14/2019)   . traZODone (DESYREL) 50 MG tablet Take 2 tablets (100 mg total) by mouth at bedtime as needed for sleep. (Patient not taking: Reported on 03/07/2019)    No facility-administered encounter medications on file as of 03/14/2019.     ALLERGIES:  No Known Allergies   PHYSICAL EXAM:  ECOG Performance status: 1   Vitals:   03/14/19 1118  BP: (!) 150/76  Pulse: (!) 115  Resp: 18  Temp: (!) 96.8 F (36 C)  SpO2: 94%   Filed Weights   03/14/19 1118  Weight: 172 lb (78 kg)    Physical Exam Vitals signs reviewed.  Constitutional:      Appearance: Normal appearance.  Cardiovascular:     Rate and Rhythm: Normal rate and regular rhythm.     Heart sounds: Normal heart sounds.  Pulmonary:     Effort: Pulmonary effort is normal.     Breath sounds: Normal breath sounds.  Abdominal:     General: There is no distension.     Palpations: Abdomen is soft. There is no mass.  Musculoskeletal:        General: No swelling.  Skin:    General: Skin is warm.  Neurological:     General: No focal deficit present.     Mental Status: He is alert and oriented to person, place, and time.  Psychiatric:        Mood and Affect: Mood normal.        Behavior: Behavior normal.      LABORATORY DATA:  I have reviewed the labs as listed.  CBC    Component Value Date/Time   WBC 8.1 02/22/2019 1144   RBC 4.55 02/22/2019 1144   HGB 14.3 02/22/2019 1144   HGB 13.2 03/22/2018 1128   HCT 45.6 02/22/2019 1144   HCT 40.1 03/22/2018 1128   PLT 278 02/22/2019 1144   PLT 355 03/22/2018 1128   MCV 100.2 (H) 02/22/2019 1144   MCV 91 03/22/2018 1128   MCH 31.4 02/22/2019 1144   MCHC 31.4 02/22/2019 1144   RDW 14.6 02/22/2019 1144   RDW 15.0 03/22/2018 1128   LYMPHSABS 0.7 02/22/2019 1144   LYMPHSABS 0.8 03/22/2018 1128   MONOABS 0.9 02/22/2019 1144   EOSABS 0.2 02/22/2019 1144   EOSABS 0.2 03/22/2018 1128   BASOSABS 0.1 02/22/2019 1144   BASOSABS 0.1 03/22/2018 1128   CMP Latest Ref Rng & Units 02/22/2019 01/19/2019 10/07/2018  Glucose 70 - 99 mg/dL  168(H) 170(H) 159(H)  BUN 8 - 23 mg/dL 17 14 19   Creatinine 0.61 - 1.24 mg/dL 0.88 0.81 0.88  Sodium 135 - 145 mmol/L 138 139 138  Potassium 3.5 - 5.1 mmol/L 4.2 4.2 4.0  Chloride 98 - 111 mmol/L 100 101 97  CO2 22 - 32 mmol/L 28 25 25   Calcium 8.9 - 10.3  mg/dL 9.2 9.0 9.8  Total Protein 6.5 - 8.1 g/dL 7.3 - 7.0  Total Bilirubin 0.3 - 1.2 mg/dL 0.5 - <0.2  Alkaline Phos 38 - 126 U/L 103 - 93  AST 15 - 41 U/L 17 - 13  ALT 0 - 44 U/L 15 - 13       DIAGNOSTIC IMAGING:  I have independently reviewed the scans and discussed with the patient.      ASSESSMENT & PLAN:   Non-small cell lung cancer, right ([facility]) 1.Stage IIIA (cT1BcN2cM0) NSCLC, favoring adenocarcinoma: -Scan at diagnosis showed 2 right upper lobe pulmonary nodules measuring 1.7 and 2.0 cm, ipsilateral mediastinal adenopathy, status post bronchoscopy on 05/28/2016, left upper lobe pulmonary nodule not biopsy proven but suspicious with imaging. -Chemoradiation therapy with weekly carboplatin and paclitaxel completed on 08/20/2016.  -Consolidation durvalumab from 09/26/2016 through 09/25/2017.    -Patient reports fatigue with energy levels of 25%. -We reviewed results of CT of the chest with contrast dated 03/10/2019 which showed increasing masslike architectural distortion in the medial aspect of the right upper lobe measuring 3.7 x 2.5 x 3.5 cm.  This was not present when I compared it with scan from June 2020.  Left lung nodules are stable. -He denied any recent infections or pneumonias.  No change in baseline cough or hemoptysis. -I have recommended a PET CT scan for further evaluation of this lesion.  I will see him back after the PET scan.  2.  Nausea: -He reported nausea since last visit 6 months ago.  He has nausea every day.  Denied any vomiting. -He is taking Compazine once daily which is not completely helping. -He has decreased appetite as result of nausea.  He denies any headaches but has blurred vision.  He lost about 7 to 8 pounds since last visit. -I have recommended doing an MRI of the head with and without contrast.  3.  Hypothyroidism: -He was previously treated with Synthroid.  He is no longer taking it as per the medication list. -We will consider checking TSH.      Total time spent is 25 minutes with more than 50% of the time spent face-to-face discussing and reviewing scans, further work-up, counseling and coordination of care.    Orders placed this encounter:  Orders Placed This Encounter  Procedures  . NM PET Image Restag (PS) Skull Base To Thigh  . MR Brain [place], [place] [phone]

## 2019-03-14 NOTE — Assessment & Plan Note (Signed)
1.Stage IIIA (cT1BcN2cM0) NSCLC, favoring adenocarcinoma: -Scan at diagnosis showed 2 right upper lobe pulmonary nodules measuring 1.7 and 2.0 cm, ipsilateral mediastinal adenopathy, status post bronchoscopy on 05/28/2016, left upper lobe pulmonary nodule not biopsy proven but suspicious with imaging. -Chemoradiation therapy with weekly carboplatin and paclitaxel completed on 08/20/2016.  -Consolidation durvalumab from 09/26/2016 through 09/25/2017.    -Patient reports fatigue with energy levels of 25%. -We reviewed results of CT of the chest with contrast dated 03/10/2019 which showed increasing masslike architectural distortion in the medial aspect of the right upper lobe measuring 3.7 x 2.5 x 3.5 cm.  This was not present when I compared it with scan from June 2020.  Left lung nodules are stable. -He denied any recent infections or pneumonias.  No change in baseline cough or hemoptysis. -I have recommended a PET CT scan for further evaluation of this lesion.  I will see him back after the PET scan.  2.  Nausea: -He reported nausea since last visit 6 months ago.  He has nausea every day.  Denied any vomiting. -He is taking Compazine once daily which is not completely helping. -He has decreased appetite as result of nausea.  He denies any headaches but has blurred vision.  He lost about 7 to 8 pounds since last visit. -I have recommended doing an MRI of the head with and without contrast.  3.  Hypothyroidism: -He was previously treated with Synthroid.  He is no longer taking it as per the medication list. -We will consider checking TSH.

## 2019-03-14 NOTE — Patient Instructions (Addendum)
[place] at [facility] Discharge Instructions  You were seen today by Dr. Delton Coombes. He went over your recent lab results. He will repeat a PET scan as well as a MRI of your brain. He will see you back after your scans for follow up.   Thank you for choosing [place] at [facility] to provide your oncology and hematology care.  To afford each patient quality time with our provider, please arrive at least 15 minutes before your scheduled appointment time.   If you have a lab appointment with the [facility] please come in thru the  Main Entrance and check in at the main information desk  You need to re-schedule your appointment should you arrive 10 or more minutes late.  We strive to give you quality time with our providers, and arriving late affects you and other patients whose appointments are after yours.  Also, if you no show three or more times for appointments you may be dismissed from the clinic at the providers discretion.     Again, thank you for choosing [facility].  Our hope is that these requests will decrease the amount of time that you wait before being seen by our physicians.       _____________________________________________________________  Should you have questions after your visit to [facility], please contact our office at [phone] between the hours of 8:00 a.m. and 4:30 p.m.  Voicemails left after 4:00 p.m. will not be returned until the following business day.  For prescription refill requests, have your pharmacy contact our office and allow 72 hours.    Cancer Center Support Programs:   > Cancer Support Group  2nd Tuesday of the month 1pm-2pm, Journey Room

## 2019-03-19 ENCOUNTER — Other Ambulatory Visit: Payer: Self-pay | Admitting: Family

## 2019-03-19 DIAGNOSIS — E1142 Type 2 diabetes mellitus with diabetic polyneuropathy: Secondary | ICD-10-CM

## 2019-03-20 ENCOUNTER — Other Ambulatory Visit: Payer: Self-pay | Admitting: Family

## 2019-03-24 ENCOUNTER — Other Ambulatory Visit: Payer: Self-pay | Admitting: Family

## 2019-03-25 ENCOUNTER — Ambulatory Visit (HOSPITAL_COMMUNITY)
Admission: RE | Admit: 2019-03-25 | Discharge: 2019-03-25 | Disposition: A | Discharge status: Home / Self Care | Payer: Medicare Other | Source: Ambulatory Visit | Attending: Hematology | Admitting: Hematology

## 2019-03-25 ENCOUNTER — Other Ambulatory Visit: Payer: Self-pay

## 2019-03-25 DIAGNOSIS — R112 Nausea with vomiting, unspecified: Secondary | ICD-10-CM | POA: Insufficient documentation

## 2019-03-25 DIAGNOSIS — H538 Other visual disturbances: Secondary | ICD-10-CM | POA: Diagnosis present

## 2019-03-25 DIAGNOSIS — R918 Other nonspecific abnormal finding of lung field: Secondary | ICD-10-CM | POA: Insufficient documentation

## 2019-03-25 MED ORDER — GADOBUTROL 1 MMOL/ML IV SOLN
7.5000 mL | Freq: Once | INTRAVENOUS | Status: AC | PRN
  Administered 2019-03-25: 13:00:00 7.5 mL via INTRAVENOUS

## 2019-03-27 ENCOUNTER — Other Ambulatory Visit: Payer: Self-pay | Admitting: Family

## 2019-03-28 ENCOUNTER — Other Ambulatory Visit: Payer: Self-pay

## 2019-03-28 ENCOUNTER — Encounter (HOSPITAL_COMMUNITY)
Admission: RE | Admit: 2019-03-28 | Discharge: 2019-03-28 | Disposition: A | Discharge status: Home / Self Care | Payer: Medicare Other | Source: Ambulatory Visit | Attending: Hematology | Admitting: Hematology

## 2019-03-28 DIAGNOSIS — R918 Other nonspecific abnormal finding of lung field: Secondary | ICD-10-CM | POA: Diagnosis present

## 2019-03-28 MED ORDER — FLUDEOXYGLUCOSE F - 18 (FDG) INJECTION
11.9800 | Freq: Once | INTRAVENOUS | Status: AC | PRN
  Administered 2019-03-28: 17:00:00 11.98 via INTRAVENOUS

## 2019-03-30 ENCOUNTER — Encounter (HOSPITAL_COMMUNITY): Payer: Self-pay | Admitting: Hematology

## 2019-03-30 ENCOUNTER — Inpatient Hospital Stay (HOSPITAL_BASED_OUTPATIENT_CLINIC_OR_DEPARTMENT_OTHER): Payer: Medicare Other | Admitting: Hematology

## 2019-03-30 ENCOUNTER — Other Ambulatory Visit (HOSPITAL_COMMUNITY): Payer: Self-pay | Admitting: *Deleted

## 2019-03-30 ENCOUNTER — Other Ambulatory Visit: Payer: Self-pay

## 2019-03-30 VITALS — BP 141/64 | HR 117 | Temp 97.3°F | Resp 18 | Wt 167.0 lb

## 2019-03-30 DIAGNOSIS — Z923 Personal history of irradiation: Secondary | ICD-10-CM | POA: Diagnosis not present

## 2019-03-30 DIAGNOSIS — Z85118 Personal history of other malignant neoplasm of bronchus and lung: Secondary | ICD-10-CM | POA: Diagnosis not present

## 2019-03-30 DIAGNOSIS — E039 Hypothyroidism, unspecified: Secondary | ICD-10-CM | POA: Diagnosis not present

## 2019-03-30 DIAGNOSIS — Z9221 Personal history of antineoplastic chemotherapy: Secondary | ICD-10-CM | POA: Diagnosis not present

## 2019-03-30 DIAGNOSIS — C3491 Malignant neoplasm of unspecified part of right bronchus or lung: Secondary | ICD-10-CM

## 2019-03-30 DIAGNOSIS — R918 Other nonspecific abnormal finding of lung field: Secondary | ICD-10-CM

## 2019-03-30 DIAGNOSIS — F17210 Nicotine dependence, cigarettes, uncomplicated: Secondary | ICD-10-CM | POA: Diagnosis not present

## 2019-03-30 DIAGNOSIS — R110 Nausea: Secondary | ICD-10-CM | POA: Diagnosis not present

## 2019-03-30 NOTE — Patient Instructions (Signed)
[place] at [facility] Discharge Instructions  You were seen today by Dr. Delton Coombes. He went over your recent lab and scan results. Everything looks good and there is no sign of cancer. He will see you back in 3 months for labs, repeat scan and follow up.   Thank you for choosing [place] at [facility] to provide your oncology and hematology care.  To afford each patient quality time with our provider, please arrive at least 15 minutes before your scheduled appointment time.   If you have a lab appointment with the [facility] please come in thru the  Main Entrance and check in at the main information desk  You need to re-schedule your appointment should you arrive 10 or more minutes late.  We strive to give you quality time with our providers, and arriving late affects you and other patients whose appointments are after yours.  Also, if you no show three or more times for appointments you may be dismissed from the clinic at the providers discretion.     Again, thank you for choosing [facility].  Our hope is that these requests will decrease the amount of time that you wait before being seen by our physicians.       _____________________________________________________________  Should you have questions after your visit to [facility], please contact our office at [phone] between the hours of 8:00 a.m. and 4:30 p.m.  Voicemails left after 4:00 p.m. will not be returned until the following business day.  For prescription refill requests, have your pharmacy contact our office and allow 72 hours.    Cancer Center Support Programs:   > Cancer Support Group  2nd Tuesday of the month 1pm-2pm, Journey Room

## 2019-03-30 NOTE — Assessment & Plan Note (Signed)
1.Stage IIIA (cT1BcN2cM0) NSCLC, favoring adenocarcinoma: -Scan at diagnosis showed 2 right upper lobe pulmonary nodules measuring 1.7 and 2.0 cm, ipsilateral mediastinal adenopathy, status post bronchoscopy on 05/28/2016, left upper lobe pulmonary nodule not biopsy proven but suspicious with imaging. -Chemoradiation therapy with weekly carboplatin and paclitaxel completed on 08/20/2016.  -Consolidation durvalumab from 09/26/2016 through 09/25/2017.    -Patient reports fatigue with energy levels of 25%. -CT of the chest on 03/10/2019 showed increasing masslike architectural distortion in the medial aspect of the right upper lobe measuring 3.7 x 2.5 x 3.5 cm.  This was not present when I compared it with scan from June 2020.  Left lung nodules are stable. -We reviewed results of the PET scan dated 03/28/2019 which showed minimal metabolic activity associated with new perihilar consolidation in the posterior aspect of the right upper lobe.  It is favoring radiation changes.  Pleural thickening is also consistent with radiation change.  No evidence of metastatic disease. -I have recommended CT of the chest in 3 months.  2.  Nausea: -He reports nausea in the mornings about 6 months.  Denied any vomiting. -We reviewed results of the MRI of the brain with and without contrast which did not show any metastatic disease.  Atrophy and chronic microvascular ischemia. -I have recommended to continue taking Compazine 10 mg in the mornings as needed.  3.  Hypothyroidism: -He was previously treated with Synthroid.  He is no longer taking it as per the medication list. -We will consider checking TSH.

## 2019-03-30 NOTE — Progress Notes (Signed)
[place], [place] [address]   CLINIC:  Medical Oncology/Hematology  PCP:  [name], [address] [place] [address] [phone]   REASON FOR VISIT:  Follow-up for NSCLC  CURRENT THERAPY: Clinical Surveillance   BRIEF ONCOLOGIC HISTORY:  Oncology History  Non-small cell lung cancer, right ([facility])  04/17/2016 Imaging   CT chest- 1. Examination is positive for bilateral upper lobe pulmonary lesions suspicious for malignancy. Further investigation with PET-CT and tissue sampling is advised. 2. Borderline enlarged paratracheal lymph nodes and right hilar nodes. Cannot rule out metastatic adenopathy. Attention to these areas on PET-CT is advised. 3. Emphysema 4. Aortic atherosclerosis and multi vessel coronary artery calcification.   04/18/2016 Initial Diagnosis   Adenocarcinoma of lung, right ([facility])   04/24/2016 PET scan   1. Adjacent hypermetabolic nodules in the RIGHT upper lobe consistent bronchogenic carcinoma. 2. Suspicion of ipsilateral hilar and paratracheal nodal metastasis. 3. Nodule in the LEFT upper lobe with suspicious morphology has a relatively low metabolic activity for size. Favor synchronous primary bronchogenic carcinoma. 4. No metabolic activity associated with a small LEFT lower lobe pulmonary nodule. Recommend attention on follow-up.   05/06/2016 Imaging   MRI brain- Negative for metastatic disease.   05/28/2016 Procedure   1. Electromagnetic navigational bronchoscopy with brushings, needle     aspirations and biopsies of bilateral upper lobe nodules. 2. Endobronchial ultrasound with mediastinal and hilar lymph node     aspirations. By Dr. [name]   06/03/2016 Pathology Results   Diagnosis 1. Lung, biopsy, Right upper lobe - BLOOD AND BENIGN BRONCHIAL AND LUNG TISSUE. 2. Lung, biopsy, Left upper lobe - BLOOD AND BENIGN BRONCHIAL AND LUNG TISSUE.   06/03/2016 Pathology Results   Diagnosis TRANSBRONCHIAL NEEDLE ASPIRATION, NAVIGATION, RUL (SPECIMEN 5 OF 7, COLLECTED 05/28/16): MALIGNANT CELLS CONSISTENT WITH NON-SMALL CELL CARCINOMA Comment There are limited tumor cells in the cell block (insufficient for molecular testing). TTF-1 is positive in a few of the atypical cells. They appear negative for synaptophysin and cytokeratin 5/6. Overall there is limited tumor, but a lung adenocarcinoma is slightly favored. Dr. [name] has reviewed the case. The case was called to Dr. [name] on 06/03/2016.   06/13/2016 Procedure   Port placed by Dr. Arnoldo Morale   06/25/2016 - 08/15/2016 Chemotherapy   The patient had palonosetron (ALOXI) injection 0.25 mg, 0.25 mg, Intravenous,  Once, 6 of 6 cycles  CARBOplatin (PARAPLATIN) 220 mg in sodium chloride 0.9 % 250 mL chemo infusion, 220 mg (100 % of original dose 215.8 mg), Intravenous,  Once, 6 of 6 cycles Dose modification:   (original dose 215.8 mg, Cycle 1),   (original dose 215.8 mg, Cycle 5),   (original dose 215.8 mg, Cycle 6),   (original dose 215.8 mg, Cycle 2),   (original dose 215.8 mg, Cycle 3),   (original dose 215.8 mg, Cycle 4)  PACLitaxel (TAXOL) 90 mg in dextrose 5 % 250 mL chemo infusion ( for chemotherapy treatment.     06/25/2016 - 08/20/2016 Radiation Therapy   Radiation in [place], [place] by Dr. Quitman Livings.  R lung to 6600 cGy   07/09/2016 - 07/10/2016 Hospital Admission   Admit date: 07/09/2016  Admission diagnosis: Fever and chills Additional comments: Negative work-up, discharged with course of Levaquin   07/09/2016 Treatment Plan Change   Treatment deferred x 1 week due to hospitalization    08/05/2016 - 08/06/2016 Hospital Admission   Admit date: 08/05/2016 Admission diagnosis: Joint pain Additional comments: Treated  with steroids with symptomatic improvement   08/25/2016 Imaging   MRI brain- Negative for intracranial metastasis on this motion degraded examination.  Stable mild-to-moderate chronic small vessel ischemic  disease.   09/18/2016 PET scan   1. Partial metabolic response. Right upper lobe 2.6 cm hypermetabolic pulmonary nodule, decreased in size and metabolism. Mildly hypermetabolic right hilar nodal metastasis, decreased in metabolism. Right paratracheal lymph node is no longer hypermetabolic. No new or progressive metastatic disease. 2. Apical left upper lobe 0.9 cm solid pulmonary nodule, slightly decreased in size, with no significant metabolism. The change in size could indicate a neoplastic etiology for this nodule. 3. Additional subcentimeter pulmonary nodules in the left lung are stable and below PET resolution . 4. Additional findings include aortic atherosclerosis, coronary atherosclerosis, moderate emphysema, stable small pericardial effusion/thickening and mild sigmoid diverticulosis.   01/13/2017 Imaging   CT chest with contrast IMPRESSION: 1. No change in size of the hypermetabolic nodular thickening in the RIGHT upper lobe. 2. LEFT upper lobe spiculated nodule is stable. 3. Rounded LEFT lower lobe pulmonary nodule is stable. 4. No evidence of lung cancer progression.   05/08/2017 Adverse Reaction   Development of Hypothyroidism- likely secondary to immunotherapy.  Levothyroxine therapy initiated.       CANCER STAGING: Cancer Staging Non-small cell lung cancer, right [facility]) Staging form: Lung, AJCC 8th Edition - Clinical stage from 06/05/2016: Stage IIIA (cT1b(2), cN2, cM0) - Signed by Baird Cancer, PA-C on 08/21/2016    INTERVAL HISTORY:  Mr. Thelin 73 y.o. male seen for follow-up of lung cancer.  He underwent PET scan on 03/28/2019.  He reports some nausea but denied any vomiting.  He is taking Compazine as needed.  Appetite is 75%.  Energy levels are 25%.  Numbness in the hands and feet has been stable.  Sleep problems are also stable.    REVIEW OF SYSTEMS:  Review of Systems  Cardiovascular: Positive for leg swelling.  Gastrointestinal: Positive for nausea.   Neurological: Positive for numbness.  Psychiatric/Behavioral: Positive for sleep disturbance.  All other systems reviewed and are negative.    PAST MEDICAL/SURGICAL HISTORY:  Past Medical History:  Diagnosis Date  . Adenocarcinoma of lung, right ([facility]) 04/18/2016  . Anxiety   . Arthritis   . Asthma   . CHF (congestive heart failure) ([facility])   . COPD (chronic obstructive pulmonary disease) ([facility])   . Depression   . Diabetes mellitus without complication (H[facility])    no meds  . DM (diabetes mellitus) ([facility]) 07/09/2016  . Dyspnea   . History of kidney stones   . Hyperlipidemia   . Hypertension   . Macular degeneration   . Neuropathy   . Non-small cell lung cancer, right ([facility]) 04/18/2016  . Prostatitis   . Pulmonary nodule, left 07/16/2016  . Sleep apnea    cpap   Past Surgical History:  Procedure Laterality Date  . CATARACT EXTRACTION, BILATERAL Bilateral   . NO PAST SURGERIES    . PORTACATH PLACEMENT Left 06/13/2016   Procedure: INSERTION PORT-A-CATH;  Surgeon: Aviva Signs, MD;  Location: [facility];  Service: General;  Laterality: Left;  Marland Kitchen VIDEO BRONCHOSCOPY WITH ENDOBRONCHIAL NAVIGATION N/A 05/28/2016   Procedure: VIDEO BRONCHOSCOPY WITH ENDOBRONCHIAL NAVIGATION;  Surgeon: [name], MD;  Location: [facility];  Service: Thoracic;  Laterality: N/A;  . VIDEO BRONCHOSCOPY WITH ENDOBRONCHIAL ULTRASOUND N/A 05/28/2016   Procedure: VIDEO BRONCHOSCOPY WITH ENDOBRONCHIAL ULTRASOUND;  Surgeon: [name], MD;  Location: [facility];  Service: Thoracic;  Laterality: N/A;  SOCIAL HISTORY:  Social History   Socioeconomic History  . Marital status: Divorced    Spouse name: Not on file  . Number of children: 5  . Years of education: 10  . Highest education level: 10th grade  Occupational History  . Occupation: Retired  Tobacco Use  . Smoking status: Current Every Day Smoker    Packs/day: 0.10    Years: 55.00    Pack years: 5.50    Types: Cigarettes    Start date: 03/11/1961  .  Smokeless tobacco: Never Used  . Tobacco comment: smoking 1 to 2 cigarettes per day  Substance and Sexual Activity  . Alcohol use: No  . Drug use: No  . Sexual activity: Yes    Birth control/protection: None  Other Topics Concern  . Not on file  Social History Narrative  . Not on file   Social Determinants of Health   Financial Resource Strain:   . Difficulty of Paying Living Expenses: Not on file  Food Insecurity:   . Worried About Charity fundraiser in the Last Year: Not on file  . Ran Out of Food in the Last Year: Not on file  Transportation Needs:   . Lack of Transportation (Medical): Not on file  . Lack of Transportation (Non-Medical): Not on file  Physical Activity:   . Days of Exercise per Week: Not on file  . Minutes of Exercise per Session: Not on file  Stress:   . Feeling of Stress : Not on file  Social Connections:   . Frequency of Communication with Friends and Family: Not on file  . Frequency of Social Gatherings with Friends and Family: Not on file  . Attends Religious Services: Not on file  . Active Member of Clubs or Organizations: Not on file  . Attends Archivist Meetings: Not on file  . Marital Status: Not on file  Intimate Partner Violence:   . Fear of Current or Ex-Partner: Not on file  . Emotionally Abused: Not on file  . Physically Abused: Not on file  . Sexually Abused: Not on file    FAMILY HISTORY:  Family History  Problem Relation Age of Onset  . Hypertension Mother   . Diabetes Father   . Heart disease Father   . Stroke Father   . Hypertension Sister     CURRENT MEDICATIONS:  Outpatient Encounter Medications as of 03/30/2019  Medication Sig Note  . aspirin EC 81 MG tablet Take 81 mg by mouth daily.   Marland Kitchen BREO ELLIPTA 200-25 MCG/INH AEPB TAKE 1 PUFF BY MOUTH EVERY DAY   . cloNIDine (CATAPRES) 0.1 MG tablet TAKE 1 TABLET (0.1 MG TOTAL) BY MOUTH 3 (THREE) TIMES DAILY.   Marland Kitchen dutasteride (AVODART) 0.5 MG capsule TAKE 1 CAPSULE BY  MOUTH EVERY DAY   . furosemide (LASIX) 20 MG tablet Take 1 tablet (20 mg total) by mouth 2 (two) times daily.   Marland Kitchen gabapentin (NEURONTIN) 300 MG capsule TAKE 1 CAPSULE BY MOUTH IN THE MORNING AND 2 CAPSULES AT NIGHT   . levothyroxine (SYNTHROID) 75 MCG tablet TAKE 1 TABLET BY MOUTH EVERY DAY   . linagliptin (TRADJENTA) 5 MG TABS tablet Take 1 tablet (5 mg total) by mouth daily.   Marland Kitchen lisinopril (ZESTRIL) 40 MG tablet Take 1 tablet (40 mg total) by mouth daily.   . metFORMIN (GLUCOPHAGE-XR) 500 MG 24 hr tablet Take 1 tablet (500 mg total) by mouth daily with breakfast. (Needs to be seen before next refill)   .  pravastatin (PRAVACHOL) 40 MG tablet Take 1 tablet (40 mg total) by mouth every 3 (three) days. M-W-F   . tamsulosin (FLOMAX) 0.4 MG CAPS capsule TAKE 2 CAPSULES BY MOUTH EVERY DAY   . umeclidinium bromide (INCRUSE ELLIPTA) 62.5 MCG/INH AEPB Inhale 1 puff into the lungs daily.   Marland Kitchen albuterol (PROVENTIL HFA;VENTOLIN HFA) 108 (90 Base) MCG/ACT inhaler USE 2 PUFF BY MOUTH EVERY 6 HOURS AS NEEDED FOR SHORTNESS OF BREATH OR WHEEZING (Patient not taking: Reported on 03/14/2019)   . fluticasone (FLONASE) 50 MCG/ACT nasal spray Place 2 sprays into both nostrils daily. (Patient not taking: Reported on 03/14/2019) 12/05/2016: As needed  . meloxicam (MOBIC) 7.5 MG tablet Take 1 tablet (7.5 mg total) by mouth daily as needed for pain (back pain). (Patient not taking: Reported on 03/14/2019)   . prochlorperazine (COMPAZINE) 10 MG tablet Take 1 tablet (10 mg total) by mouth every 6 (six) hours as needed for nausea or vomiting. (Patient not taking: Reported on 03/14/2019)   . traZODone (DESYREL) 50 MG tablet Take 2 tablets (100 mg total) by mouth at bedtime as needed for sleep. (Patient not taking: Reported on 03/07/2019)    No facility-administered encounter medications on file as of 03/30/2019.    ALLERGIES:  No Known Allergies   PHYSICAL EXAM:  ECOG Performance status: 1  Vitals:   03/30/19 1132  BP: (!)  141/64  Pulse: (!) 117  Resp: 18  Temp: (!) 97.3 F (36.3 C)  SpO2: 96%   Filed Weights   03/30/19 1132  Weight: 167 lb (75.8 kg)    Physical Exam Vitals reviewed.  Constitutional:      Appearance: Normal appearance.  Cardiovascular:     Rate and Rhythm: Normal rate and regular rhythm.     Heart sounds: Normal heart sounds.  Pulmonary:     Effort: Pulmonary effort is normal.     Breath sounds: Normal breath sounds.  Abdominal:     General: There is no distension.     Palpations: Abdomen is soft. There is no mass.  Musculoskeletal:        General: No swelling.  Skin:    General: Skin is warm.  Neurological:     General: No focal deficit present.     Mental Status: He is alert and oriented to person, place, and time.  Psychiatric:        Mood and Affect: Mood normal.        Behavior: Behavior normal.      LABORATORY DATA:  I have reviewed the labs as listed.  CBC    Component Value Date/Time   WBC 8.1 02/22/2019 1144   RBC 4.55 02/22/2019 1144   HGB 14.3 02/22/2019 1144   HGB 13.2 03/22/2018 1128   HCT 45.6 02/22/2019 1144   HCT 40.1 03/22/2018 1128   PLT 278 02/22/2019 1144   PLT 355 03/22/2018 1128   MCV 100.2 (H) 02/22/2019 1144   MCV 91 03/22/2018 1128   MCH 31.4 02/22/2019 1144   MCHC 31.4 02/22/2019 1144   RDW 14.6 02/22/2019 1144   RDW 15.0 03/22/2018 1128   LYMPHSABS 0.7 02/22/2019 1144   LYMPHSABS 0.8 03/22/2018 1128   MONOABS 0.9 02/22/2019 1144   EOSABS 0.2 02/22/2019 1144   EOSABS 0.2 03/22/2018 1128   BASOSABS 0.1 02/22/2019 1144   BASOSABS 0.1 03/22/2018 1128   CMP Latest Ref Rng & Units 02/22/2019 01/19/2019 10/07/2018  Glucose 70 - 99 mg/dL 168(H) 170(H) 159(H)  BUN 8 - 23 mg/dL  17 14 19   Creatinine 0.61 - 1.24 mg/dL 0.88 0.81 0.88  Sodium 135 - 145 mmol/L 138 139 138  Potassium 3.5 - 5.1 mmol/L 4.2 4.2 4.0  Chloride 98 - 111 mmol/L 100 101 97  CO2 22 - 32 mmol/L 28 25 25   Calcium 8.9 - 10.3 mg/dL 9.2 9.0 9.8  Total Protein 6.5 -  8.1 g/dL 7.3 - 7.0  Total Bilirubin 0.3 - 1.2 mg/dL 0.5 - <0.2  Alkaline Phos 38 - 126 U/L 103 - 93  AST 15 - 41 U/L 17 - 13  ALT 0 - 44 U/L 15 - 13       DIAGNOSTIC IMAGING:  I have independently reviewed the scans and discussed with the patient.      ASSESSMENT & PLAN:   Non-small cell lung cancer, right (HCC) 1.Stage IIIA (cT1BcN2cM0) NSCLC, favoring adenocarcinoma: -Scan at diagnosis showed 2 right upper lobe pulmonary nodules measuring 1.7 and 2.0 cm, ipsilateral mediastinal adenopathy, status post bronchoscopy on 05/28/2016, left upper lobe pulmonary nodule not biopsy proven but suspicious with imaging. -Chemoradiation therapy with weekly carboplatin and paclitaxel completed on 08/20/2016.  -Consolidation durvalumab from 09/26/2016 through 09/25/2017.    -Patient reports fatigue with energy levels of 25%. -CT of the chest on 03/10/2019 showed increasing masslike architectural distortion in the medial aspect of the right upper lobe measuring 3.7 x 2.5 x 3.5 cm.  This was not present when I compared it with scan from June 2020.  Left lung nodules are stable. -We reviewed results of the PET scan dated 03/28/2019 which showed minimal metabolic activity associated with new perihilar consolidation in the posterior aspect of the right upper lobe.  It is favoring radiation changes.  Pleural thickening is also consistent with radiation change.  No evidence of metastatic disease. -I have recommended CT of the chest in 3 months.  2.  Nausea: -He reports nausea in the mornings about 6 months.  Denied any vomiting. -We reviewed results of the MRI of the brain with and without contrast which did not show any metastatic disease.  Atrophy and chronic microvascular ischemia. -I have recommended to continue taking Compazine 10 mg in the mornings as needed.  3.  Hypothyroidism: -He was previously treated with Synthroid.  He is no longer taking it as per the medication list. -We will consider  checking TSH.       Orders placed this encounter:  Orders Placed This Encounter  Procedures  . CT Chest W Contrast      Derek Jack, MD [place] [phone]

## 2019-04-03 ENCOUNTER — Other Ambulatory Visit: Payer: Self-pay | Admitting: Family

## 2019-04-03 DIAGNOSIS — C3491 Malignant neoplasm of unspecified part of right bronchus or lung: Secondary | ICD-10-CM

## 2019-04-03 DIAGNOSIS — R110 Nausea: Secondary | ICD-10-CM

## 2019-04-13 ENCOUNTER — Other Ambulatory Visit: Payer: Self-pay | Admitting: Family

## 2019-04-13 DIAGNOSIS — E1142 Type 2 diabetes mellitus with diabetic polyneuropathy: Secondary | ICD-10-CM

## 2019-04-14 ENCOUNTER — Encounter: Payer: Self-pay | Admitting: Family Medicine

## 2019-04-14 ENCOUNTER — Ambulatory Visit (INDEPENDENT_AMBULATORY_CARE_PROVIDER_SITE_OTHER): Payer: Medicare Other | Admitting: Family Medicine

## 2019-04-14 ENCOUNTER — Other Ambulatory Visit: Payer: Self-pay | Admitting: Family

## 2019-04-14 ENCOUNTER — Other Ambulatory Visit: Payer: Self-pay

## 2019-04-14 VITALS — BP 148/78 | HR 113 | Temp 97.3°F | Ht 66.0 in | Wt 183.8 lb

## 2019-04-14 DIAGNOSIS — I509 Heart failure, unspecified: Secondary | ICD-10-CM

## 2019-04-14 DIAGNOSIS — E1142 Type 2 diabetes mellitus with diabetic polyneuropathy: Secondary | ICD-10-CM | POA: Diagnosis not present

## 2019-04-14 DIAGNOSIS — R609 Edema, unspecified: Secondary | ICD-10-CM | POA: Diagnosis not present

## 2019-04-14 MED ORDER — METFORMIN HCL ER 500 MG PO TB24: 500.0000 mg | ORAL_TABLET | Freq: Every day | ORAL | 0 refills | Status: DC

## 2019-04-14 MED ORDER — FUROSEMIDE 20 MG PO TABS: 20.0000 mg | ORAL_TABLET | Freq: Two times a day (BID) | ORAL | 0 refills | Status: DC

## 2019-04-14 NOTE — Progress Notes (Signed)
Assessment & Plan:  1-2. Peripheral edema/Congestive heart failure, unspecified HF chronicity, unspecified heart failure type ([place]) - Encouraged patient to resume furosemide 20 mg twice daily.  He is to continue elevating his legs as much as possible.  Discussed applying compression hose first thing in the morning and removing them at bedtime.  Continue low-salt diet and daily weights.  Education provided on edema. - furosemide (LASIX) 20 MG tablet; Take 1 tablet (20 mg total) by mouth 2 (two) times daily.  Dispense: 60 tablet; Refill: 0 - BMP8+EGFR  3. Type 2 diabetes mellitus with diabetic polyneuropathy, without long-term current use of insulin ([facility] [place]) - Patient requested a refill of his Metformin while he was here in the office.  I did refill but advised him that he needs to schedule follow-up with his PCP as soon as possible as he is due for a routine follow-up of his diabetes which explains why he is about to run out of medication. - metFORMIN (GLUCOPHAGE-XR) 500 MG 24 hr tablet; Take 1 tablet (500 mg total) by mouth daily with breakfast.  Dispense: 30 tablet; Refill: 0   Follow up plan: Return ASAP with PCP.  Hendricks Limes, MSN, APRN, FNP-C [facility]  Subjective:   Patient ID: John [name], male    DOB: 09/27/1945, 74 y.o.   MRN: [id number]  HPI: John [name] is a 74 y.o. male presenting on 04/14/2019 for Leg Swelling (both feet and legs ) and Medication Refill (metformin)  Patient reports increased edema in bilateral feet and legs.  He does have a history of CHF and has been taking furosemide 20 mg once daily.  The prescription on his chart reads twice daily.  Patient reports he went down to once daily after he spoke to his PCP in October as he was running to the bathroom too frequently taking the Lasix twice daily.  He reports his swelling did start after he decreased Lasix down to once daily from twice daily.  He does not wear compression hose.  He does eat  a low-sodium diet.  He does keep his feet elevated as much as he can.  He has had no increase in shortness of breath or change in his weight per his home scale.   ROS: Negative unless specifically indicated above in HPI.   Relevant past medical history reviewed and updated as indicated.   Allergies and medications reviewed and updated.   Current Outpatient Medications:  .  albuterol (PROVENTIL HFA;VENTOLIN HFA) 108 (90 Base) MCG/ACT inhaler, USE 2 PUFF BY MOUTH EVERY 6 HOURS AS NEEDED FOR SHORTNESS OF BREATH OR WHEEZING, Disp: 18 Inhaler, Rfl: 3 .  aspirin EC 81 MG tablet, Take 81 mg by mouth daily., Disp: , Rfl:  .  BREO ELLIPTA 200-25 MCG/INH AEPB, TAKE 1 PUFF BY MOUTH EVERY DAY, Disp: 180 each, Rfl: 0 .  cloNIDine (CATAPRES) 0.1 MG tablet, TAKE 1 TABLET (0.1 MG TOTAL) BY MOUTH 3 (THREE) TIMES DAILY., Disp: 270 tablet, Rfl: 1 .  dutasteride (AVODART) 0.5 MG capsule, TAKE 1 CAPSULE BY MOUTH EVERY DAY, Disp: 90 capsule, Rfl: 1 .  fluticasone (FLONASE) 50 MCG/ACT nasal spray, Place 2 sprays into both nostrils daily., Disp: 16 g, Rfl: 6 .  furosemide (LASIX) 20 MG tablet, Take 1 tablet (20 mg total) by mouth 2 (two) times daily., Disp: 60 tablet, Rfl: 0 .  gabapentin (NEURONTIN) 300 MG capsule, TAKE 1 CAPSULE BY MOUTH IN THE MORNING AND 2 CAPSULES AT NIGHT, Disp: 270 capsule, Rfl: 0 .  levothyroxine (SYNTHROID) 75 MCG tablet, TAKE 1 TABLET BY MOUTH EVERY DAY, Disp: 90 tablet, Rfl: 3 .  linagliptin (TRADJENTA) 5 MG TABS tablet, Take 1 tablet (5 mg total) by mouth daily., Disp: 90 tablet, Rfl: 3 .  lisinopril (ZESTRIL) 40 MG tablet, Take 1 tablet (40 mg total) by mouth daily., Disp: 90 tablet, Rfl: 3 .  meloxicam (MOBIC) 7.5 MG tablet, TAKE 1 TABLET (7.5 MG TOTAL) BY MOUTH DAILY AS NEEDED FOR PAIN (BACK PAIN)., Disp: 90 tablet, Rfl: 1 .  metFORMIN (GLUCOPHAGE-XR) 500 MG 24 hr tablet, Take 1 tablet (500 mg total) by mouth daily with breakfast., Disp: 30 tablet, Rfl: 0 .  pravastatin (PRAVACHOL) 40  MG tablet, Take 1 tablet (40 mg total) by mouth every 3 (three) days. M-W-F, Disp: 90 tablet, Rfl: 1 .  prochlorperazine (COMPAZINE) 10 MG tablet, TAKE 1 TABLET (10 MG TOTAL) BY MOUTH EVERY 6 (SIX) HOURS AS NEEDED FOR NAUSEA OR VOMITING., Disp: 60 tablet, Rfl: 2 .  tamsulosin (FLOMAX) 0.4 MG CAPS capsule, TAKE 2 CAPSULES BY MOUTH EVERY DAY, Disp: 180 capsule, Rfl: 0 .  traZODone (DESYREL) 50 MG tablet, Take 2 tablets (100 mg total) by mouth at bedtime as needed for sleep., Disp: 180 tablet, Rfl: 1 .  umeclidinium bromide (INCRUSE ELLIPTA) 62.5 MCG/INH AEPB, Inhale 1 puff into the lungs daily., Disp: 90 each, Rfl: 1  No Known Allergies  Objective:   BP (!) 148/78 Comment: manual  Pulse (!) 113   Temp (!) 97.3 F (36.3 C) (Temporal)   Ht 5' 6"  (1.676 m)   Wt 183 lb 12.8 oz (83.4 kg)   SpO2 98%   BMI 29.67 kg/m    Physical Exam Vitals reviewed.  Constitutional:      General: He is not in acute distress.    Appearance: Normal appearance. He is overweight. He is not ill-appearing, toxic-appearing or diaphoretic.  HENT:     Head: Normocephalic and atraumatic.  Eyes:     General: No scleral icterus.       Right eye: No discharge.        Left eye: No discharge.     Conjunctiva/sclera: Conjunctivae normal.  Cardiovascular:     Rate and Rhythm: Normal rate and regular rhythm.     Heart sounds: Normal heart sounds. No murmur. No friction rub. No gallop.   Pulmonary:     Effort: Pulmonary effort is normal. No respiratory distress.     Breath sounds: Normal breath sounds. No stridor. No wheezing, rhonchi or rales.  Musculoskeletal:        General: Normal range of motion.     Cervical back: Normal range of motion.     Right lower leg: 2+ Edema present.     Left lower leg: 2+ Edema present.  Skin:    General: Skin is warm and dry.  Neurological:     Mental Status: He is alert and oriented to person, place, and time. Mental status is at baseline.  Psychiatric:        Mood and Affect:  Mood normal.        Behavior: Behavior normal.        Thought Content: Thought content normal.        Judgment: Judgment normal.

## 2019-04-14 NOTE — Patient Instructions (Signed)
Edema  Edema is when you have too much fluid in your body or under your skin. Edema may make your legs, feet, and ankles swell up. Swelling is also common in looser tissues, like around your eyes. This is a common condition. It gets more common as you get older. There are many possible causes of edema. Eating too much salt (sodium) and being on your feet or sitting for a long time can cause edema in your legs, feet, and ankles. Hot weather may make edema worse. Edema is usually painless. Your skin may look swollen or shiny. Follow these instructions at home:  Keep the swollen body part raised (elevated) above the level of your heart when you are sitting or lying down.  Do not sit still or stand for a long time.  Do not wear tight clothes. Do not wear garters on your upper legs.  Exercise your legs. This can help the swelling go down.  Wear elastic bandages or support stockings as told by your doctor.  Eat a low-salt (low-sodium) diet to reduce fluid as told by your doctor.  Depending on the cause of your swelling, you may need to limit how much fluid you drink (fluid restriction).  Take over-the-counter and prescription medicines only as told by your doctor. Contact a doctor if:  Treatment is not working.  You have heart, liver, or kidney disease and have symptoms of edema.  You have sudden and unexplained weight gain. Get help right away if:  You have shortness of breath or chest pain.  You cannot breathe when you lie down.  You have pain, redness, or warmth in the swollen areas.  You have heart, liver, or kidney disease and get edema all of a sudden.  You have a fever and your symptoms get worse all of a sudden. Summary  Edema is when you have too much fluid in your body or under your skin.  Edema may make your legs, feet, and ankles swell up. Swelling is also common in looser tissues, like around your eyes.  Raise (elevate) the swollen body part above the level of your  heart when you are sitting or lying down.  Follow your doctor's instructions about diet and how much fluid you can drink (fluid restriction). This information is not intended to replace advice given to you by your health care provider. Make sure you discuss any questions you have with your health care provider. Document Revised: 03/27/2017 Document Reviewed: 04/11/2016 Elsevier Patient Education  2020 [employer]  

## 2019-04-15 LAB — BMP8+EGFR
BUN/Creatinine Ratio: 15 (ref 10–24)
BUN: 12 mg/dL (ref 8–27)
CO2: 23 mmol/L (ref 20–29)
Calcium: 9.1 mg/dL (ref 8.6–10.2)
Chloride: 100 mmol/L (ref 96–106)
Creatinine, Ser: 0.78 mg/dL (ref 0.76–1.27)
GFR calc Af Amer: 104 mL/min/{1.73_m2} (ref >59)
GFR calc non Af Amer: 90 mL/min/{1.73_m2} (ref >59)
Glucose: 163 mg/dL — ABNORMAL HIGH (ref 65–99)
Potassium: 3.9 mmol/L (ref 3.5–5.2)
Sodium: 139 mmol/L (ref 134–144)

## 2019-04-22 ENCOUNTER — Other Ambulatory Visit: Payer: Self-pay

## 2019-04-25 ENCOUNTER — Encounter: Payer: Self-pay | Admitting: Family

## 2019-04-25 ENCOUNTER — Ambulatory Visit (INDEPENDENT_AMBULATORY_CARE_PROVIDER_SITE_OTHER): Payer: Medicare Other | Admitting: Family

## 2019-04-25 ENCOUNTER — Other Ambulatory Visit: Payer: Self-pay

## 2019-04-25 VITALS — BP 170/93 | HR 73 | Temp 97.8°F | Ht 66.0 in | Wt 181.2 lb

## 2019-04-25 DIAGNOSIS — Z23 Encounter for immunization: Secondary | ICD-10-CM

## 2019-04-25 DIAGNOSIS — E039 Hypothyroidism, unspecified: Secondary | ICD-10-CM | POA: Diagnosis not present

## 2019-04-25 DIAGNOSIS — E1169 Type 2 diabetes mellitus with other specified complication: Secondary | ICD-10-CM

## 2019-04-25 DIAGNOSIS — G4733 Obstructive sleep apnea (adult) (pediatric): Secondary | ICD-10-CM

## 2019-04-25 DIAGNOSIS — R110 Nausea: Secondary | ICD-10-CM

## 2019-04-25 DIAGNOSIS — E1142 Type 2 diabetes mellitus with diabetic polyneuropathy: Secondary | ICD-10-CM

## 2019-04-25 DIAGNOSIS — N401 Enlarged prostate with lower urinary tract symptoms: Secondary | ICD-10-CM | POA: Diagnosis not present

## 2019-04-25 DIAGNOSIS — I10 Essential (primary) hypertension: Secondary | ICD-10-CM | POA: Diagnosis not present

## 2019-04-25 DIAGNOSIS — J431 Panlobular emphysema: Secondary | ICD-10-CM | POA: Diagnosis not present

## 2019-04-25 DIAGNOSIS — F5101 Primary insomnia: Secondary | ICD-10-CM | POA: Diagnosis not present

## 2019-04-25 DIAGNOSIS — R600 Localized edema: Secondary | ICD-10-CM

## 2019-04-25 DIAGNOSIS — I509 Heart failure, unspecified: Secondary | ICD-10-CM

## 2019-04-25 DIAGNOSIS — C3491 Malignant neoplasm of unspecified part of right bronchus or lung: Secondary | ICD-10-CM

## 2019-04-25 DIAGNOSIS — R609 Edema, unspecified: Secondary | ICD-10-CM | POA: Diagnosis not present

## 2019-04-25 DIAGNOSIS — E785 Hyperlipidemia, unspecified: Secondary | ICD-10-CM | POA: Diagnosis not present

## 2019-04-25 DIAGNOSIS — F17200 Nicotine dependence, unspecified, uncomplicated: Secondary | ICD-10-CM

## 2019-04-25 DIAGNOSIS — R3912 Poor urinary stream: Secondary | ICD-10-CM

## 2019-04-25 LAB — BAYER DCA HB A1C WAIVED: HB A1C (BAYER DCA - WAIVED): 7.8 % — ABNORMAL HIGH (ref <7.0)

## 2019-04-25 MED ORDER — BREO ELLIPTA 200-25 MCG/INH IN AEPB: 1.0000 | INHALATION_SPRAY | Freq: Every day | RESPIRATORY_TRACT | 0 refills | Status: DC

## 2019-04-25 MED ORDER — LISINOPRIL 40 MG PO TABS: 40.0000 mg | ORAL_TABLET | Freq: Every day | ORAL | 3 refills | Status: DC

## 2019-04-25 MED ORDER — TAMSULOSIN HCL 0.4 MG PO CAPS: 0.8000 mg | ORAL_CAPSULE | Freq: Every day | ORAL | 0 refills | Status: DC

## 2019-04-25 MED ORDER — METFORMIN HCL ER 500 MG PO TB24: 500.0000 mg | ORAL_TABLET | Freq: Every day | ORAL | 0 refills | Status: DC

## 2019-04-25 MED ORDER — LEVOTHYROXINE SODIUM 75 MCG PO TABS: 75.0000 ug | ORAL_TABLET | Freq: Every day | ORAL | 3 refills | Status: DC

## 2019-04-25 MED ORDER — LINAGLIPTIN 5 MG PO TABS: 5.0000 mg | ORAL_TABLET | Freq: Every day | ORAL | 3 refills | Status: DC

## 2019-04-25 MED ORDER — PROCHLORPERAZINE MALEATE 10 MG PO TABS: 10.0000 mg | ORAL_TABLET | Freq: Four times a day (QID) | ORAL | 2 refills | Status: DC | PRN

## 2019-04-25 MED ORDER — MELOXICAM 7.5 MG PO TABS: 7.5000 mg | ORAL_TABLET | Freq: Every day | ORAL | 1 refills | Status: DC | PRN

## 2019-04-25 MED ORDER — INCRUSE ELLIPTA 62.5 MCG/INH IN AEPB: 1.0000 | INHALATION_SPRAY | Freq: Every day | RESPIRATORY_TRACT | 1 refills | Status: DC

## 2019-04-25 MED ORDER — PRAVASTATIN SODIUM 40 MG PO TABS: 40.0000 mg | ORAL_TABLET | ORAL | 1 refills | Status: DC

## 2019-04-25 MED ORDER — AMLODIPINE BESYLATE 5 MG PO TABS: 5.0000 mg | ORAL_TABLET | Freq: Every day | ORAL | 3 refills | Status: DC

## 2019-04-25 MED ORDER — GABAPENTIN 300 MG PO CAPS: ORAL_CAPSULE | ORAL | 0 refills | Status: DC

## 2019-04-25 MED ORDER — FUROSEMIDE 20 MG PO TABS: 20.0000 mg | ORAL_TABLET | Freq: Two times a day (BID) | ORAL | 1 refills | Status: DC

## 2019-04-25 NOTE — Patient Instructions (Signed)

## 2019-04-25 NOTE — Addendum Note (Signed)
Addended by: Shelbie Ammons on: 04/25/2019 04:11 PM   Modules accepted: Orders

## 2019-04-25 NOTE — Progress Notes (Signed)
Subjective:    Patient ID: John [name], male    DOB: 1945/08/19, 74 y.o.   MRN: [id number]  Chief Complaint  Patient presents with  . Medical Management of Chronic Issues  . Diabetes    Pt presents to the [facility] for  chronic care follow up. He is followed by Cardiologists every 6 months for CHF. Followed by Oncologists every 6 months for non-small lung cancer Diabetes He presents for his follow-up diabetic visit. He has type 2 diabetes mellitus. His disease course has been stable. There are no hypoglycemic associated symptoms. Associated symptoms include blurred vision, fatigue and foot paresthesias. There are no hypoglycemic complications. Symptoms are stable. Diabetic complications include heart disease, nephropathy and peripheral neuropathy. Pertinent negatives for diabetic complications include no CVA. Risk factors for coronary artery disease include dyslipidemia, diabetes mellitus, male sex, hypertension and sedentary lifestyle. He is following a generally unhealthy diet. His overall blood glucose range is 130-140 mg/dl. Eye exam is not current.  Hyperlipidemia This is a chronic problem. The current episode started more than 1 year ago. The problem is controlled. Recent lipid tests were reviewed and are normal. Exacerbating diseases include hypothyroidism. Factors aggravating his hyperlipidemia include smoking. Current antihyperlipidemic treatment includes statins. The current treatment provides moderate improvement of lipids. Risk factors for coronary artery disease include dyslipidemia, diabetes mellitus, male sex, hypertension and a sedentary lifestyle.  Thyroid Problem Presents for follow-up visit. Symptoms include constipation and fatigue. Patient reports no cold intolerance or dry skin. The symptoms have been stable. His past medical history is significant for hyperlipidemia.  Arthritis Presents for follow-up visit. He complains of pain. The symptoms have been stable.  Affected locations include the right knee and left knee (back). His pain is at a severity of 6/10. Associated symptoms include fatigue.  Benign Prostatic Hypertrophy This is a chronic problem. The current episode started more than 1 year ago. The problem has been waxing and waning since onset. Irritative symptoms include nocturia (3).  Nicotine Dependence Presents for follow-up visit. Symptoms include fatigue. His urge triggers include company of smokers. The symptoms have been stable. Number of cigarettes per day: 1/2-3/3.  Congestive Heart Failure Presents for follow-up visit. Associated symptoms include fatigue and nocturia (3). The symptoms have been stable.  COPD PT continues to smoke 1/2-3/4 pack a day. Continues to have SOB with exertion.  OSA Uses CPAP nightly. Stable.    Review of Systems  Constitutional: Positive for fatigue.  Eyes: Positive for blurred vision.  Gastrointestinal: Positive for constipation.  Endocrine: Negative for cold intolerance.  Genitourinary: Positive for nocturia (3).  Musculoskeletal: Positive for arthritis.  All other systems reviewed and are negative.      Objective:   Physical Exam Vitals reviewed.  Constitutional:      General: He is not in acute distress.    Appearance: He is well-developed.  HENT:     Head: Normocephalic.     Right Ear: Tympanic membrane normal.     Left Ear: Tympanic membrane normal.  Eyes:     General:        Right eye: No discharge.        Left eye: No discharge.     Pupils: Pupils are equal, round, and reactive to light.  Neck:     Thyroid: No thyromegaly.  Cardiovascular:     Rate and Rhythm: Regular rhythm. Tachycardia present.     Heart sounds: Normal heart sounds. No murmur.  Pulmonary:     Effort: Pulmonary effort is  normal. No respiratory distress.     Breath sounds: Normal breath sounds. No wheezing.  Abdominal:     General: Bowel sounds are normal. There is no distension.     Palpations: Abdomen is  soft.     Tenderness: There is no abdominal tenderness.  Musculoskeletal:        General: No tenderness. Normal range of motion.     Cervical back: Normal range of motion and neck supple.     Right lower leg: Edema (trace ) present.     Left lower leg: Edema (trace) present.  Skin:    General: Skin is warm and dry.     Findings: No erythema or rash.  Neurological:     Mental Status: He is alert and oriented to person, place, and time.     Cranial Nerves: No cranial nerve deficit.     Deep Tendon Reflexes: Reflexes are normal and symmetric.  Psychiatric:        Behavior: Behavior normal.        Thought Content: Thought content normal.        Judgment: Judgment normal.       Diabetic Foot Exam - Simple   Simple Foot Form Visual Inspection No deformities, no ulcerations, no other skin breakdown bilaterally: Yes Sensation Testing See comments: Yes Pulse Check Posterior Tibialis and Dorsalis pulse intact bilaterally: Yes Comments Negative monofilament testing bilaterally, callus formation, and long thick toenails. PT will make a follow up with Dr. Irving Shows     BP (!) 160/90   Pulse (!) 112   Temp 97.8 F (36.6 C) (Temporal)   Ht 5' 6"  (1.676 m)   Wt 181 lb 3.2 oz (82.2 kg)   SpO2 97%   BMI 29.25 kg/m      Assessment & Plan:  Zayaan Kozak comes in today with chief complaint of Medical Management of Chronic Issues and Diabetes   Diagnosis and orders addressed:  1. Essential hypertension - Will add Norvasc 5 mg today - CMP14+EGFR - CBC with Differential/Platelet - amLODipine (NORVASC) 5 MG tablet; Take 1 tablet (5 mg total) by mouth daily.  Dispense: 90 tablet; Refill: 3 - lisinopril (ZESTRIL) 40 MG tablet; Take 1 tablet (40 mg total) by mouth daily.  Dispense: 90 tablet; Refill: 3  2. Congestive heart failure, unspecified HF chronicity, unspecified heart failure type (HCC) - CMP14+EGFR - CBC with Differential/Platelet - furosemide (LASIX) 20 MG tablet; Take 1  tablet (20 mg total) by mouth 2 (two) times daily.  Dispense: 180 tablet; Refill: 1  3. Panlobular emphysema (HCC) - CMP14+EGFR - CBC with Differential/Platelet - umeclidinium bromide (INCRUSE ELLIPTA) 62.5 MCG/INH AEPB; Inhale 1 puff into the lungs daily.  Dispense: 90 each; Refill: 1  4. Non-small cell lung cancer, right (HCC) - CMP14+EGFR - CBC with Differential/Platelet - prochlorperazine (COMPAZINE) 10 MG tablet; Take 1 tablet (10 mg total) by mouth every 6 (six) hours as needed for nausea or vomiting.  Dispense: 60 tablet; Refill: 2  5. OSA (obstructive sleep apnea) - CMP14+EGFR - CBC with Differential/Platelet  6. Type 2 diabetes mellitus with diabetic polyneuropathy, without long-term current use of insulin (HCC) - CMP14+EGFR - CBC with Differential/Platelet - Microalbumin / creatinine urine ratio - metFORMIN (GLUCOPHAGE-XR) 500 MG 24 hr tablet; Take 1 tablet (500 mg total) by mouth daily with breakfast.  Dispense: 30 tablet; Refill: 0 - linagliptin (TRADJENTA) 5 MG TABS tablet; Take 1 tablet (5 mg total) by mouth daily.  Dispense: 90 tablet; Refill: 3 - Bayer  DCA Hb A1c Waived  7. Hypothyroidism, unspecified type - CMP14+EGFR - CBC with Differential/Platelet - TSH  8. Diabetic polyneuropathy associated with type 2 diabetes mellitus (HCC) - CMP14+EGFR - CBC with Differential/Platelet - gabapentin (NEURONTIN) 300 MG capsule; TAKE 1 CAPSULE BY MOUTH IN THE MORNING AND 2 CAPSULES AT NIGHT  Dispense: 270 capsule; Refill: 0  9. Hyperlipidemia associated with type 2 diabetes mellitus (HCC) - CMP14+EGFR - CBC with Differential/Platelet - Lipid panel - pravastatin (PRAVACHOL) 40 MG tablet; Take 1 tablet (40 mg total) by mouth every 3 (three) days. M-W-F  Dispense: 90 tablet; Refill: 1  10. Current smoker - CMP14+EGFR - CBC with Differential/Platelet  11. Primary insomnia - CMP14+EGFR - CBC with Differential/Platelet  12. Benign prostatic hyperplasia with weak urinary  stream - CMP14+EGFR - CBC with Differential/Platelet  13. Peripheral edema - furosemide (LASIX) 20 MG tablet; Take 1 tablet (20 mg total) by mouth 2 (two) times daily.  Dispense: 180 tablet; Refill: 1  14. Nausea without vomiting - prochlorperazine (COMPAZINE) 10 MG tablet; Take 1 tablet (10 mg total) by mouth every 6 (six) hours as needed for nausea or vomiting.  Dispense: 60 tablet; Refill: 2   Labs pending Health Maintenance reviewed Diet and exercise encouraged  Follow up plan: 2 weeks to recheck HTN  Evelina Dun, FNP

## 2019-04-26 ENCOUNTER — Other Ambulatory Visit: Payer: Self-pay | Admitting: Family

## 2019-04-26 ENCOUNTER — Telehealth: Payer: Self-pay | Admitting: Family

## 2019-04-26 LAB — CMP14+EGFR
ALT: 11 IU/L (ref 0–44)
AST: 14 IU/L (ref 0–40)
Albumin/Globulin Ratio: 1.7 (ref 1.2–2.2)
Albumin: 4.3 g/dL (ref 3.7–4.7)
Alkaline Phosphatase: 137 IU/L — ABNORMAL HIGH (ref 39–117)
BUN/Creatinine Ratio: 14 (ref 10–24)
BUN: 11 mg/dL (ref 8–27)
Bilirubin Total: 0.3 mg/dL (ref 0.0–1.2)
CO2: 26 mmol/L (ref 20–29)
Calcium: 9.6 mg/dL (ref 8.6–10.2)
Chloride: 98 mmol/L (ref 96–106)
Creatinine, Ser: 0.77 mg/dL (ref 0.76–1.27)
GFR calc Af Amer: 104 mL/min/{1.73_m2} (ref >59)
GFR calc non Af Amer: 90 mL/min/{1.73_m2} (ref >59)
Globulin, Total: 2.6 g/dL (ref 1.5–4.5)
Glucose: 160 mg/dL — ABNORMAL HIGH (ref 65–99)
Potassium: 4 mmol/L (ref 3.5–5.2)
Sodium: 142 mmol/L (ref 134–144)
Total Protein: 6.9 g/dL (ref 6.0–8.5)

## 2019-04-26 LAB — TSH: TSH: 1.62 u[IU]/mL (ref 0.450–4.500)

## 2019-04-26 LAB — CBC WITH DIFFERENTIAL/PLATELET
Basophils Absolute: 0.1 10*3/uL (ref 0.0–0.2)
Basos: 1 %
EOS (ABSOLUTE): 0.2 10*3/uL (ref 0.0–0.4)
Eos: 2 %
Hematocrit: 44.6 % (ref 37.5–51.0)
Hemoglobin: 14.9 g/dL (ref 13.0–17.7)
Immature Grans (Abs): 0 10*3/uL (ref 0.0–0.1)
Immature Granulocytes: 0 %
Lymphocytes Absolute: 0.7 10*3/uL (ref 0.7–3.1)
Lymphs: 9 %
MCH: 30.2 pg (ref 26.6–33.0)
MCHC: 33.4 g/dL (ref 31.5–35.7)
MCV: 90 fL (ref 79–97)
Monocytes Absolute: 0.9 10*3/uL (ref 0.1–0.9)
Monocytes: 12 %
Neutrophils Absolute: 5.8 10*3/uL (ref 1.4–7.0)
Neutrophils: 76 %
Platelets: 271 10*3/uL (ref 150–450)
RBC: 4.94 x10E6/uL (ref 4.14–5.80)
RDW: 14 % (ref 11.6–15.4)
WBC: 7.6 10*3/uL (ref 3.4–10.8)

## 2019-04-26 LAB — LIPID PANEL
Chol/HDL Ratio: 4.9 ratio (ref 0.0–5.0)
Cholesterol, Total: 136 mg/dL (ref 100–199)
HDL: 28 mg/dL — ABNORMAL LOW (ref >39)
LDL Chol Calc (NIH): 78 mg/dL (ref 0–99)
Triglycerides: 175 mg/dL — ABNORMAL HIGH (ref 0–149)
VLDL Cholesterol Cal: 30 mg/dL (ref 5–40)

## 2019-04-26 LAB — MICROALBUMIN / CREATININE URINE RATIO
Creatinine, Urine: 69.4 mg/dL
Microalb/Creat Ratio: 347 mg/g creat — ABNORMAL HIGH (ref 0–29)
Microalbumin, Urine: 241 ug/mL

## 2019-04-26 NOTE — Telephone Encounter (Signed)
Pt aware.

## 2019-04-28 ENCOUNTER — Other Ambulatory Visit: Payer: Self-pay | Admitting: Family

## 2019-04-28 NOTE — Progress Notes (Signed)
Ok, continue the pravastatin daily.

## 2019-05-08 ENCOUNTER — Other Ambulatory Visit: Payer: Self-pay | Admitting: Family

## 2019-05-08 DIAGNOSIS — E1142 Type 2 diabetes mellitus with diabetic polyneuropathy: Secondary | ICD-10-CM

## 2019-05-10 DIAGNOSIS — Z23 Encounter for immunization: Secondary | ICD-10-CM | POA: Diagnosis not present

## 2019-05-11 ENCOUNTER — Telehealth: Payer: Self-pay | Admitting: Adult Health

## 2019-05-11 NOTE — Telephone Encounter (Signed)
Please pull a CPAP report

## 2019-05-11 NOTE — Telephone Encounter (Signed)
-----   Message from Ward Givens, NP sent at 03/07/2019  2:11 PM EST ----- CPAP

## 2019-05-11 NOTE — Telephone Encounter (Signed)
CPAP report has been placed on NPs desk for review.

## 2019-05-16 ENCOUNTER — Telehealth: Payer: Self-pay | Admitting: Family

## 2019-05-16 ENCOUNTER — Other Ambulatory Visit: Payer: Self-pay

## 2019-05-17 ENCOUNTER — Encounter: Payer: Self-pay | Admitting: Family

## 2019-05-17 ENCOUNTER — Ambulatory Visit: Payer: Medicare Other | Admitting: Family

## 2019-05-17 ENCOUNTER — Ambulatory Visit (INDEPENDENT_AMBULATORY_CARE_PROVIDER_SITE_OTHER): Payer: Medicare Other | Admitting: Family

## 2019-05-17 ENCOUNTER — Other Ambulatory Visit: Payer: Self-pay | Admitting: Family Medicine

## 2019-05-17 ENCOUNTER — Other Ambulatory Visit: Payer: Self-pay

## 2019-05-17 VITALS — BP 135/70 | HR 109 | Temp 97.5°F | Ht 66.0 in | Wt 181.2 lb

## 2019-05-17 DIAGNOSIS — I10 Essential (primary) hypertension: Secondary | ICD-10-CM | POA: Diagnosis not present

## 2019-05-17 DIAGNOSIS — Z23 Encounter for immunization: Secondary | ICD-10-CM

## 2019-05-17 NOTE — Patient Instructions (Signed)

## 2019-05-17 NOTE — Progress Notes (Signed)
Subjective:    Patient ID: John [name], male    DOB: 06/04/1945, 73 y.o.   MRN: [id number]  Chief Complaint  Patient presents with  . Hypertension    4 week rck    Pt presents to the office today to recheck HTN. He was seen on 04/25/19 and added Norvac 5 mg. His BP is at goal today.  Hypertension This is a chronic problem. The current episode started more than 1 year ago. The problem has been resolved since onset. The problem is controlled. Associated symptoms include malaise/fatigue, peripheral edema and shortness of breath ("I have COPD"). Pertinent negatives include no headaches. Risk factors for coronary artery disease include male gender and obesity. Past treatments include ACE inhibitors and calcium channel blockers. The current treatment provides moderate improvement.      Review of Systems  Constitutional: Positive for malaise/fatigue.  Respiratory: Positive for shortness of breath ("I have COPD").   Neurological: Negative for headaches.  All other systems reviewed and are negative.      Objective:   Physical Exam Vitals reviewed.  Constitutional:      General: He is not in acute distress.    Appearance: He is well-developed.  HENT:     Head: Normocephalic.     Right Ear: Tympanic membrane normal.     Left Ear: Tympanic membrane normal.  Eyes:     General:        Right eye: No discharge.        Left eye: No discharge.     Pupils: Pupils are equal, round, and reactive to light.  Neck:     Thyroid: No thyromegaly.  Cardiovascular:     Rate and Rhythm: Normal rate and regular rhythm.     Heart sounds: Normal heart sounds. No murmur.  Pulmonary:     Effort: Pulmonary effort is normal. No respiratory distress.     Breath sounds: Decreased breath sounds present. No wheezing.  Abdominal:     General: Bowel sounds are normal. There is no distension.     Palpations: Abdomen is soft.     Tenderness: There is no abdominal tenderness.  Musculoskeletal:      General: No tenderness. Normal range of motion.     Cervical back: Normal range of motion and neck supple.  Skin:    General: Skin is warm and dry.     Findings: No erythema or rash.  Neurological:     Mental Status: He is alert and oriented to person, place, and time.     Cranial Nerves: No cranial nerve deficit.     Deep Tendon Reflexes: Reflexes are normal and symmetric.  Psychiatric:        Behavior: Behavior normal.        Thought Content: Thought content normal.        Judgment: Judgment normal.       BP 135/70   Pulse (!) 109   Temp (!) 97.5 F (36.4 C) (Temporal)   Ht 5' 6" (1.676 m)   Wt 181 lb 3.2 oz (82.2 kg)   SpO2 95%   BMI 29.25 kg/m      Assessment & Plan:  John [name] comes in today with chief complaint of Hypertension (4 week rck )   Diagnosis and orders addressed:  1. Essential hypertension At goal today! -Dash diet information given -Exercise encouraged - Stress Management  -Continue current meds -RTO in 4 months  - BMP8+EGFR  TDAP given today  Christy Hawks, FNP  

## 2019-05-17 NOTE — Addendum Note (Signed)
Addended by: Brynda Peon F on: 05/17/2019 12:39 PM   Modules accepted: Orders

## 2019-05-19 LAB — BMP8+EGFR
BUN/Creatinine Ratio: 16 (ref 10–24)
BUN: 13 mg/dL (ref 8–27)
CO2: 21 mmol/L (ref 20–29)
Calcium: 9.2 mg/dL (ref 8.6–10.2)
Chloride: 101 mmol/L (ref 96–106)
Creatinine, Ser: 0.81 mg/dL (ref 0.76–1.27)
GFR calc Af Amer: 102 mL/min/{1.73_m2} (ref >59)
GFR calc non Af Amer: 88 mL/min/{1.73_m2} (ref >59)
Glucose: 190 mg/dL — ABNORMAL HIGH (ref 65–99)
Potassium: 4.3 mmol/L (ref 3.5–5.2)
Sodium: 141 mmol/L (ref 134–144)

## 2019-05-19 NOTE — Telephone Encounter (Signed)
Download indicates that he uses machine 29 out of 30 days for compliance of 97%.  He is machine greater than 4 hours 17 days for compliance of 57%.  On average he uses his machine 3 hours and 52 minutes.  His residual AHI is 1.3 on 13 cm of water with EPR 3.  Leak in the 95th percentile is 46.9 L/min  Please advise patient that his leak is still relatively high and his usage is suboptimal.  Please ensure that the patient had a mask refitting that was ordered at the last visit.

## 2019-05-25 ENCOUNTER — Other Ambulatory Visit: Payer: Self-pay

## 2019-05-25 ENCOUNTER — Ambulatory Visit (INDEPENDENT_AMBULATORY_CARE_PROVIDER_SITE_OTHER): Payer: Medicare Other | Admitting: *Deleted

## 2019-05-25 ENCOUNTER — Encounter (HOSPITAL_COMMUNITY): Payer: Self-pay

## 2019-05-25 ENCOUNTER — Inpatient Hospital Stay (HOSPITAL_COMMUNITY): Payer: Medicare Other | Attending: Hematology

## 2019-05-25 VITALS — Ht 66.0 in | Wt 181.2 lb

## 2019-05-25 DIAGNOSIS — Z452 Encounter for adjustment and management of vascular access device: Secondary | ICD-10-CM | POA: Diagnosis not present

## 2019-05-25 DIAGNOSIS — Z85118 Personal history of other malignant neoplasm of bronchus and lung: Secondary | ICD-10-CM | POA: Diagnosis not present

## 2019-05-25 DIAGNOSIS — Z0000 Encounter for general adult medical examination without abnormal findings: Secondary | ICD-10-CM | POA: Diagnosis not present

## 2019-05-25 MED ORDER — HEPARIN SOD (PORK) LOCK FLUSH 100 UNIT/ML IV SOLN
500.0000 [IU] | Freq: Once | INTRAVENOUS | Status: AC
  Administered 2019-05-25: 12:00:00 500 [IU] via INTRAVENOUS

## 2019-05-25 MED ORDER — SODIUM CHLORIDE 0.9% FLUSH
10.0000 mL | Freq: Once | INTRAVENOUS | Status: AC
  Administered 2019-05-25: 12:00:00 10 mL

## 2019-05-25 NOTE — Patient Instructions (Signed)
  [name] , Thank you for taking time to come for your Medicare Wellness Visit. I appreciate your ongoing commitment to your health goals. Please review the following plan we discussed and let me know if I can assist you in the future.   These are the goals we discussed: Goals    . Exercise 3x per week (30 min per time)     Try to exercise for at least 30 minutes 3 times weekly    . Have 3 meals a day     Try to eat 3 meals daily that consist of lean proteins, fruit and vegetables        This is a list of the screening recommended for you and due dates:  Health Maintenance  Topic Date Due  . Complete foot exam   12/30/2018  . Eye exam for diabetics  05/08/2019  . Hemoglobin A1C  10/23/2019  . Cologuard (Stool DNA test)  01/29/2021  . Tetanus Vaccine  05/16/2029  . Flu Shot  Completed  .  Hepatitis C: One time screening is recommended by Center for Disease Control  (CDC) for  adults born from [birth year] through 1965.   Completed  . Pneumonia vaccines  Completed

## 2019-05-25 NOTE — Progress Notes (Signed)
John [name] presented for Portacath access and flush. Portacath located in the right chest wall accessed with  H 20 needle. Clean, Dry and Intact No blood return . Portacath flushed with 11ml NS and 500U/20ml Heparin per protocol and needle removed intact. Procedure without incident. Patient tolerated procedure well.  No complaints at this time. Discharged from clinic ambulatory. F/U with [facility] as scheduled.

## 2019-05-25 NOTE — Progress Notes (Signed)
MEDICARE ANNUAL WELLNESS VISIT  05/25/2019  Telephone Visit Disclaimer This Medicare AWV was conducted by telephone due to national recommendations for restrictions regarding the COVID-19 Pandemic (e.g. social distancing).  I verified, using two identifiers, that I am speaking with John [name] or their authorized healthcare agent. I discussed the limitations, risks, security, and privacy concerns of performing an evaluation and management service by telephone and the potential availability of an in-person appointment in the future. The patient expressed understanding and agreed to proceed.   Subjective:  John [name] is a 74 y.o. male patient of Hawks, [name], FNP who had a Medicare Annual Wellness Visit today via telephone. John [name] is Retired and lives with an adult companion. he has 5 children. he reports that he is socially active and does interact with friends/family regularly. he is minimally physically active and enjoys fishing.  Patient Care Team: [name], FNP as PCP - General (Family Medicine) Herminio Commons, MD as PCP - Cardiology (Cardiology) Derek Jack, MD as Consulting Physician (Hematology)  Advanced Directives 05/25/2019 03/30/2019 03/14/2019 02/22/2019 11/09/2018 09/09/2018 08/09/2018  Does Patient Have a Medical Advance Directive? No No No No No No No  Would patient like information on creating a medical advance directive? No - Patient declined No - Patient declined Yes (MAU/Ambulatory/Procedural Areas - Information given) No - Patient declined No - Patient declined No - Patient declined No - Patient declined    Hospital Utilization Over the Past 12 Months: # of hospitalizations or ER visits: 0 # of surgeries: 0  Review of Systems    Patient reports that his overall health is unchanged compared to last year.  History obtained from chart review and the patient General ROS: negative  Patient Reported Readings (BP, Pulse, CBG, Weight,  etc) none  Pain Assessment Pain : No/denies pain     Current Medications & Allergies (verified) Allergies as of 05/25/2019   No Known Allergies     Medication List       Accurate as of May 25, 2019 10:42 AM. If you have any questions, ask your nurse or doctor.        albuterol 108 (90 Base) MCG/ACT inhaler Commonly known as: VENTOLIN HFA USE 2 PUFF BY MOUTH EVERY 6 HOURS AS NEEDED FOR SHORTNESS OF BREATH OR WHEEZING   amLODipine 5 MG tablet Commonly known as: NORVASC Take 1 tablet (5 mg total) by mouth daily.   aspirin EC 81 MG tablet Take 81 mg by mouth daily.   Breo Ellipta 200-25 MCG/INH Aepb Generic drug: fluticasone furoate-vilanterol Inhale 1 puff into the lungs daily.   cloNIDine 0.1 MG tablet Commonly known as: CATAPRES TAKE 1 TABLET (0.1 MG TOTAL) BY MOUTH 3 (THREE) TIMES DAILY.   dutasteride 0.5 MG capsule Commonly known as: AVODART TAKE 1 CAPSULE BY MOUTH EVERY DAY   fluticasone 50 MCG/ACT nasal spray Commonly known as: FLONASE Place 2 sprays into both nostrils daily.   furosemide 20 MG tablet Commonly known as: LASIX Take 1 tablet (20 mg total) by mouth 2 (two) times daily.   gabapentin 300 MG capsule Commonly known as: NEURONTIN TAKE 1 CAPSULE BY MOUTH IN THE MORNING AND 2 CAPSULES AT NIGHT   Incruse Ellipta 62.5 MCG/INH Aepb Generic drug: umeclidinium bromide Inhale 1 puff into the lungs daily.   levothyroxine 75 MCG tablet Commonly known as: SYNTHROID Take 1 tablet (75 mcg total) by mouth daily.   linagliptin 5 MG Tabs tablet Commonly known as: Tradjenta Take 1 tablet (  5 mg total) by mouth daily.   lisinopril 40 MG tablet Commonly known as: ZESTRIL Take 1 tablet (40 mg total) by mouth daily.   meloxicam 7.5 MG tablet Commonly known as: MOBIC Take 1 tablet (7.5 mg total) by mouth daily as needed for pain (back pain).   metFORMIN 500 MG 24 hr tablet Commonly known as: GLUCOPHAGE-XR TAKE 1 TABLET BY MOUTH EVERY DAY WITH  BREAKFAST   pravastatin 40 MG tablet Commonly known as: PRAVACHOL Take 1 tablet (40 mg total) by mouth every 3 (three) days. M-W-F   prochlorperazine 10 MG tablet Commonly known as: COMPAZINE Take 1 tablet (10 mg total) by mouth every 6 (six) hours as needed for nausea or vomiting.   tamsulosin 0.4 MG Caps capsule Commonly known as: FLOMAX Take 2 capsules (0.8 mg total) by mouth daily.       History (reviewed): Past Medical History:  Diagnosis Date  . Adenocarcinoma of lung, right ([facility]) 04/18/2016  . Anxiety   . Arthritis   . Asthma   . CHF (congestive heart failure) ([facility])   . COPD (chronic obstructive pulmonary disease) ([facility])   . Depression   . Diabetes mellitus without complication ([facility])    no meds  . DM (diabetes mellitus) ([place]) 07/09/2016  . Dyspnea   . History of kidney stones   . Hyperlipidemia   . Hypertension   . Macular degeneration   . Neuropathy   . Non-small cell lung cancer, right ([facility]) 04/18/2016  . Prostatitis   . Pulmonary nodule, left 07/16/2016  . Sleep apnea    cpap   Past Surgical History:  Procedure Laterality Date  . CATARACT EXTRACTION, BILATERAL Bilateral   . NO PAST SURGERIES    . PORTACATH PLACEMENT Left 06/13/2016   Procedure: INSERTION PORT-A-CATH;  Surgeon: Aviva Signs, MD;  Location: [facility];  Service: General;  Laterality: Left;  Marland Kitchen VIDEO BRONCHOSCOPY WITH ENDOBRONCHIAL NAVIGATION N/A 05/28/2016   Procedure: VIDEO BRONCHOSCOPY WITH ENDOBRONCHIAL NAVIGATION;  Surgeon: [name], MD;  Location: [facility];  Service: Thoracic;  Laterality: N/A;  . VIDEO BRONCHOSCOPY WITH ENDOBRONCHIAL ULTRASOUND N/A 05/28/2016   Procedure: VIDEO BRONCHOSCOPY WITH ENDOBRONCHIAL ULTRASOUND;  Surgeon: [name], MD;  Location: [facility];  Service: Thoracic;  Laterality: N/A;   Family History  Problem Relation Age of Onset  . Hypertension Mother   . Diabetes Father   . Heart disease Father   . Stroke Father   . Hypertension Sister    Social  History   Socioeconomic History  . Marital status: Divorced    Spouse name: Not on file  . Number of children: 5  . Years of education: 10  . Highest education level: 10th grade  Occupational History  . Occupation: Retired  Tobacco Use  . Smoking status: Current Every Day Smoker    Packs/day: 0.10    Years: 55.00    Pack years: 5.50    Types: Cigarettes    Start date: 03/11/1961  . Smokeless tobacco: Never Used  . Tobacco comment: smoking 1 to 2 cigarettes per day  Substance and Sexual Activity  . Alcohol use: No  . Drug use: No  . Sexual activity: Yes    Birth control/protection: None  Other Topics Concern  . Not on file  Social History Narrative  . Not on file   Social Determinants of Health   Financial Resource Strain: Low Risk   . Difficulty of Paying Living Expenses: Not hard at all  Food Insecurity: No Food  Insecurity  . Worried About Charity fundraiser in the Last Year: Never true  . Ran Out of Food in the Last Year: Never true  Transportation Needs: No Transportation Needs  . Lack of Transportation (Medical): No  . Lack of Transportation (Non-Medical): No  Physical Activity: Inactive  . Days of Exercise per Week: 0 days  . Minutes of Exercise per Session: 0 min  Stress: No Stress Concern Present  . Feeling of Stress : Not at all  Social Connections: Somewhat Isolated  . Frequency of Communication with Friends and Family: More than three times a week  . Frequency of Social Gatherings with Friends and Family: More than three times a week  . Attends Religious Services: Never  . Active Member of Clubs or Organizations: No  . Attends Archivist Meetings: Never  . Marital Status: Living with partner    Activities of Daily Living In your present state of health, do you have any difficulty performing the following activities: 05/25/2019  Hearing? N  Vision? N  Difficulty concentrating or making decisions? N  Walking or climbing stairs? Y  Comment  COPD  Dressing or bathing? N  Doing errands, shopping? N  Preparing Food and eating ? N  Using the Toilet? N  In the past six months, have you accidently leaked urine? N  Do you have problems with loss of bowel control? N  Managing your Medications? N  Managing your Finances? N  Housekeeping or managing your Housekeeping? N  Some recent data might be hidden    Patient Education/ Literacy How often do you need to have someone help you when you read instructions, pamphlets, or other written materials from your doctor or pharmacy?: 1 - Never What is the last grade level you completed in school?: 10th grade  Exercise Current Exercise Habits: The patient does not participate in regular exercise at present, Exercise limited by: None identified  Diet Patient reports consuming 2 meals a day and 1 snack(s) a day Patient reports that his primary diet is: Regular Patient reports that she does have regular access to food.   Depression Screen PHQ 2/9 Scores 05/25/2019 05/25/2019 05/17/2019 04/25/2019 04/14/2019 04/23/2018 03/22/2018  PHQ - 2 Score 0 0 0 0 0 3 2  PHQ- 9 Score - - - - - 11 8     Fall Risk Fall Risk  05/25/2019 05/25/2019 05/17/2019 04/25/2019 04/14/2019  Falls in the past year? 0 0 0 0 0  Number falls in past yr: 0 0 - - -  Injury with Fall? 0 0 - - -  Risk for fall due to : No Fall Risks No Fall Risks - - -  Follow up Falls evaluation completed Falls evaluation completed - - -     Objective:  John [name] seemed alert and oriented and he participated appropriately during our telephone visit.  Blood Pressure Weight BMI  BP Readings from Last 3 Encounters:  05/17/19 135/70  04/25/19 (!) 170/93  04/14/19 (!) 148/78   Wt Readings from Last 3 Encounters:  05/25/19 181 lb 3.5 oz (82.2 kg)  05/17/19 181 lb 3.2 oz (82.2 kg)  04/25/19 181 lb 3.2 oz (82.2 kg)   BMI Readings from Last 1 Encounters:  05/25/19 29.25 kg/m    *Unable to obtain current vital signs, weight, and BMI due  to telephone visit type  Hearing/Vision  . John [name] did not seem to have difficulty with hearing/understanding during the telephone conversation . Reports that he has not  had a formal eye exam by an eye care professional within the past year . Reports that he has not had a formal hearing evaluation within the past year *Unable to fully assess hearing and vision during telephone visit type  Cognitive Function: 6CIT Screen 05/25/2019  What Year? 0 points  What month? 0 points  What time? 0 points  Count back from 20 0 points  Months in reverse 0 points  Repeat phrase 0 points  Total Score 0   (Normal:0-7, Significant for Dysfunction: >8)  Normal Cognitive Function Screening: Yes   Immunization & Health Maintenance Record Immunization History  Administered Date(s) Administered  . Fluad Quad(high Dose 65+) 01/19/2019  . Influenza, High Dose Seasonal PF 12/28/2015  . Influenza,inj,Quad PF,6+ Mos 01/23/2017  . PFIZER SARS-COV-2 Vaccination 05/10/2019  . Pneumococcal Conjugate-13 06/17/2017  . Pneumococcal Polysaccharide-23 04/25/2019  . Tdap 05/17/2019  . Zoster 07/10/2014    Health Maintenance  Topic Date Due  . FOOT EXAM  12/30/2018  . OPHTHALMOLOGY EXAM  05/08/2019  . HEMOGLOBIN A1C  10/23/2019  . Fecal DNA (Cologuard)  01/29/2021  . TETANUS/TDAP  05/16/2029  . INFLUENZA VACCINE  Completed  . Hepatitis C Screening  Completed  . PNA vac Low Risk Adult  Completed       Assessment  This is a routine wellness examination for John [name].  Health Maintenance: Due or Overdue Health Maintenance Due  Topic Date Due  . FOOT EXAM  12/30/2018  . OPHTHALMOLOGY EXAM  05/08/2019    John [name] does not need a referral for Community Assistance: Care Management:   no Social Work:    not applicable Prescription Assistance:  no Nutrition/Diabetes Education:  no   Plan:  Personalized Goals Goals Addressed            This Visit's Progress   . Exercise 3x per week  (30 min per time)       Try to exercise for at least 30 minutes 3 times weekly    . Have 3 meals a day       Try to eat 3 meals daily that consist of lean proteins, fruit and vegetables       Personalized Health Maintenance & Screening Recommendations  Diabetes screening  Lung Cancer Screening Recommended: no (Low Dose CT Chest recommended if Age 52-80 years, 30 pack-year currently smoking OR have quit w/in past 15 years) Hepatitis C Screening recommended: no HIV Screening recommended: no  Advanced Directives: Written information was not prepared per patient's request.  Referrals & Orders No orders of the defined types were placed in this encounter.   Follow-up Plan . Follow-up with [name], FNP as planned   I have personally reviewed and noted the following in the patient's chart:   . Medical and social history . Use of alcohol, tobacco or illicit drugs  . Current medications and supplements . Functional ability and status . Nutritional status . Physical activity . Advanced directives . List of other physicians . Hospitalizations, surgeries, and ER visits in previous 12 months . Vitals . Screenings to include cognitive, depression, and falls . Referrals and appointments  In addition, I have reviewed and discussed with John [name] certain preventive protocols, quality metrics, and best practice recommendations. A written personalized care plan for preventive services as well as general preventive health recommendations is available and can be mailed to the patient at his request.      John Heath, LPN  09/01/7822

## 2019-06-04 ENCOUNTER — Other Ambulatory Visit: Payer: Self-pay | Admitting: Family

## 2019-06-04 ENCOUNTER — Other Ambulatory Visit: Payer: Self-pay | Admitting: Family Medicine

## 2019-06-04 DIAGNOSIS — E1142 Type 2 diabetes mellitus with diabetic polyneuropathy: Secondary | ICD-10-CM

## 2019-06-04 DIAGNOSIS — R0602 Shortness of breath: Secondary | ICD-10-CM

## 2019-06-04 DIAGNOSIS — C3491 Malignant neoplasm of unspecified part of right bronchus or lung: Secondary | ICD-10-CM

## 2019-06-07 DIAGNOSIS — Z23 Encounter for immunization: Secondary | ICD-10-CM | POA: Diagnosis not present

## 2019-06-28 ENCOUNTER — Inpatient Hospital Stay (HOSPITAL_COMMUNITY): Payer: Medicare Other | Attending: Hematology

## 2019-06-28 ENCOUNTER — Ambulatory Visit (HOSPITAL_COMMUNITY)
Admission: RE | Admit: 2019-06-28 | Discharge: 2019-06-28 | Disposition: A | Discharge status: Home / Self Care | Payer: Medicare Other | Source: Ambulatory Visit | Attending: Hematology | Admitting: Hematology

## 2019-06-28 ENCOUNTER — Other Ambulatory Visit: Payer: Self-pay

## 2019-06-28 DIAGNOSIS — Z85118 Personal history of other malignant neoplasm of bronchus and lung: Secondary | ICD-10-CM | POA: Diagnosis not present

## 2019-06-28 DIAGNOSIS — C3491 Malignant neoplasm of unspecified part of right bronchus or lung: Secondary | ICD-10-CM

## 2019-06-28 DIAGNOSIS — E039 Hypothyroidism, unspecified: Secondary | ICD-10-CM | POA: Diagnosis not present

## 2019-06-28 DIAGNOSIS — C3490 Malignant neoplasm of unspecified part of unspecified bronchus or lung: Secondary | ICD-10-CM | POA: Diagnosis not present

## 2019-06-28 LAB — TSH: TSH: 1.066 u[IU]/mL (ref 0.350–4.500)

## 2019-06-28 LAB — COMPREHENSIVE METABOLIC PANEL
ALT: 18 U/L (ref 0–44)
AST: 22 U/L (ref 15–41)
Albumin: 4.1 g/dL (ref 3.5–5.0)
Alkaline Phosphatase: 111 U/L (ref 38–126)
Anion gap: 11 (ref 5–15)
BUN: 14 mg/dL (ref 8–23)
CO2: 27 mmol/L (ref 22–32)
Calcium: 9.3 mg/dL (ref 8.9–10.3)
Chloride: 98 mmol/L (ref 98–111)
Creatinine, Ser: 0.79 mg/dL (ref 0.61–1.24)
GFR calc Af Amer: >60 mL/min (ref >60)
GFR calc non Af Amer: >60 mL/min (ref >60)
Glucose, Bld: 196 mg/dL — ABNORMAL HIGH (ref 70–99)
Potassium: 3.9 mmol/L (ref 3.5–5.1)
Sodium: 136 mmol/L (ref 135–145)
Total Bilirubin: 0.5 mg/dL (ref 0.3–1.2)
Total Protein: 8.1 g/dL (ref 6.5–8.1)

## 2019-06-28 LAB — CBC WITH DIFFERENTIAL/PLATELET
Abs Immature Granulocytes: 0.02 10*3/uL (ref 0.00–0.07)
Basophils Absolute: 0.1 10*3/uL (ref 0.0–0.1)
Basophils Relative: 1 %
Eosinophils Absolute: 0.2 10*3/uL (ref 0.0–0.5)
Eosinophils Relative: 2 %
HCT: 47.5 % (ref 39.0–52.0)
Hemoglobin: 15.3 g/dL (ref 13.0–17.0)
Immature Granulocytes: 0 %
Lymphocytes Relative: 8 %
Lymphs Abs: 0.7 10*3/uL (ref 0.7–4.0)
MCH: 31.4 pg (ref 26.0–34.0)
MCHC: 32.2 g/dL (ref 30.0–36.0)
MCV: 97.3 fL (ref 80.0–100.0)
Monocytes Absolute: 0.9 10*3/uL (ref 0.1–1.0)
Monocytes Relative: 10 %
Neutro Abs: 7.4 10*3/uL (ref 1.7–7.7)
Neutrophils Relative %: 79 %
Platelets: 302 10*3/uL (ref 150–400)
RBC: 4.88 MIL/uL (ref 4.22–5.81)
RDW: 16.6 % — ABNORMAL HIGH (ref 11.5–15.5)
WBC: 9.2 10*3/uL (ref 4.0–10.5)
nRBC: 0 % (ref 0.0–0.2)

## 2019-06-28 MED ORDER — IOHEXOL 300 MG/ML  SOLN
75.0000 mL | Freq: Once | INTRAMUSCULAR | Status: AC | PRN
  Administered 2019-06-28: 75 mL via INTRAVENOUS

## 2019-07-05 ENCOUNTER — Other Ambulatory Visit: Payer: Self-pay

## 2019-07-05 ENCOUNTER — Inpatient Hospital Stay (HOSPITAL_BASED_OUTPATIENT_CLINIC_OR_DEPARTMENT_OTHER): Payer: Medicare Other | Admitting: Hematology

## 2019-07-05 ENCOUNTER — Encounter (HOSPITAL_COMMUNITY): Payer: Self-pay | Admitting: Hematology

## 2019-07-05 VITALS — BP 139/65 | HR 114 | Temp 96.0°F | Resp 18 | Wt 181.8 lb

## 2019-07-05 DIAGNOSIS — Z85118 Personal history of other malignant neoplasm of bronchus and lung: Secondary | ICD-10-CM | POA: Diagnosis not present

## 2019-07-05 DIAGNOSIS — C3491 Malignant neoplasm of unspecified part of right bronchus or lung: Secondary | ICD-10-CM

## 2019-07-05 DIAGNOSIS — E039 Hypothyroidism, unspecified: Secondary | ICD-10-CM | POA: Diagnosis not present

## 2019-07-05 MED ORDER — TEMAZEPAM 30 MG PO CAPS: 30.0000 mg | ORAL_CAPSULE | Freq: Every evening | ORAL | 0 refills | Status: DC | PRN

## 2019-07-05 NOTE — Patient Instructions (Signed)
[place] at [facility] Discharge Instructions  You were seen today by Dr. Delton Coombes. He went over your recent results. He will repeat your scan prior to your next visit. He will send in a new prescription for Restoril to help you sleep. He will see you back in 4 months for labs and follow up.   Thank you for choosing [place] at [facility] to provide your oncology and hematology care.  To afford each patient quality time with our provider, please arrive at least 15 minutes before your scheduled appointment time.   If you have a lab appointment with the [facility] please come in thru the  Main Entrance and check in at the main information desk  You need to re-schedule your appointment should you arrive 10 or more minutes late.  We strive to give you quality time with our providers, and arriving late affects you and other patients whose appointments are after yours.  Also, if you no show three or more times for appointments you may be dismissed from the clinic at the providers discretion.     Again, thank you for choosing [facility].  Our hope is that these requests will decrease the amount of time that you wait before being seen by our physicians.       _____________________________________________________________  Should you have questions after your visit to [facility], please contact our office at [phone] between the hours of 8:00 a.m. and 4:30 p.m.  Voicemails left after 4:00 p.m. will not be returned until the following business day.  For prescription refill requests, have your pharmacy contact our office and allow 72 hours.    Cancer Center Support Programs:   > Cancer Support Group  2nd Tuesday of the month 1pm-2pm, Journey Room

## 2019-07-05 NOTE — Progress Notes (Signed)
[name], [name] [id number]   CLINIC:  Medical Oncology/Hematology  PCP:  John [name], [place] [address] [phone]   REASON FOR VISIT:  Follow-up for NSCLC  CURRENT THERAPY: Clinical Surveillance   BRIEF ONCOLOGIC HISTORY:  Oncology History  Non-small cell lung cancer, right ([place])  04/17/2016 Imaging   CT chest- 1. Examination is positive for bilateral upper lobe pulmonary lesions suspicious for malignancy. Further investigation with PET-CT and tissue sampling is advised. 2. Borderline enlarged paratracheal lymph nodes and right hilar nodes. Cannot rule out metastatic adenopathy. Attention to these areas on PET-CT is advised. 3. Emphysema 4. Aortic atherosclerosis and multi vessel coronary artery calcification.   04/18/2016 Initial Diagnosis   Adenocarcinoma of lung, right ([place])   04/24/2016 PET scan   1. Adjacent hypermetabolic nodules in the RIGHT upper lobe consistent bronchogenic carcinoma. 2. Suspicion of ipsilateral hilar and paratracheal nodal metastasis. 3. Nodule in the LEFT upper lobe with suspicious morphology has a relatively low metabolic activity for size. Favor synchronous primary bronchogenic carcinoma. 4. No metabolic activity associated with a small LEFT lower lobe pulmonary nodule. Recommend attention on follow-up.   05/06/2016 Imaging   MRI brain- Negative for metastatic disease.   05/28/2016 Procedure   1. Electromagnetic navigational bronchoscopy with brushings, needle     aspirations and biopsies of bilateral upper lobe nodules. 2. Endobronchial ultrasound with mediastinal and hilar lymph node     aspirations. By Dr. [name]   06/03/2016 Pathology Results   Diagnosis 1. Lung, biopsy, Right upper lobe - BLOOD AND BENIGN BRONCHIAL AND LUNG TISSUE. 2. Lung, biopsy, Left upper lobe - BLOOD AND BENIGN BRONCHIAL AND LUNG TISSUE.   06/03/2016 Pathology Results   Diagnosis TRANSBRONCHIAL NEEDLE ASPIRATION, NAVIGATION, RUL (SPECIMEN 5 OF 7, COLLECTED 05/28/16): MALIGNANT CELLS CONSISTENT WITH NON-SMALL CELL CARCINOMA Comment There are limited tumor cells in the cell block (insufficient for molecular testing). TTF-1 is positive in a few of the atypical cells. They appear negative for synaptophysin and cytokeratin 5/6. Overall there is limited tumor, but a lung adenocarcinoma is slightly favored. Dr. [name] has reviewed the case. The case was called to Dr. [name] on 06/03/2016.   06/13/2016 Procedure   Port placed by Dr. Arnoldo Morale   06/25/2016 - 08/15/2016 Chemotherapy   The patient had palonosetron (ALOXI) injection 0.25 mg, 0.25 mg, Intravenous,  Once, 6 of 6 cycles  CARBOplatin (PARAPLATIN) 220 mg in sodium chloride 0.9 % 250 mL chemo infusion, 220 mg (100 % of original dose 215.8 mg), Intravenous,  Once, 6 of 6 cycles Dose modification:   (original dose 215.8 mg, Cycle 1),   (original dose 215.8 mg, Cycle 5),   (original dose 215.8 mg, Cycle 6),   (original dose 215.8 mg, Cycle 2),   (original dose 215.8 mg, Cycle 3),   (original dose 215.8 mg, Cycle 4)  PACLitaxel (TAXOL) 90 mg in dextrose 5 % 250 mL chemo infusion ( for chemotherapy treatment.     06/25/2016 - 08/20/2016 Radiation Therapy   Radiation in [place], [place] by Dr. Quitman Livings.  R lung to 6600 cGy   07/09/2016 - 07/10/2016 Hospital Admission   Admit date: 07/09/2016  Admission diagnosis: Fever and chills Additional comments: Negative work-up, discharged with course of Levaquin   07/09/2016 Treatment Plan Change   Treatment deferred x 1 week due to hospitalization    08/05/2016 - 08/06/2016 Hospital Admission   Admit date: 08/05/2016 Admission diagnosis: Joint pain Additional comments: Treated  with steroids with symptomatic improvement   08/25/2016 Imaging   MRI brain- Negative for intracranial metastasis on this motion degraded examination.  Stable mild-to-moderate chronic small vessel ischemic  disease.   09/18/2016 PET scan   1. Partial metabolic response. Right upper lobe 2.6 cm hypermetabolic pulmonary nodule, decreased in size and metabolism. Mildly hypermetabolic right hilar nodal metastasis, decreased in metabolism. Right paratracheal lymph node is no longer hypermetabolic. No new or progressive metastatic disease. 2. Apical left upper lobe 0.9 cm solid pulmonary nodule, slightly decreased in size, with no significant metabolism. The change in size could indicate a neoplastic etiology for this nodule. 3. Additional subcentimeter pulmonary nodules in the left lung are stable and below PET resolution . 4. Additional findings include aortic atherosclerosis, coronary atherosclerosis, moderate emphysema, stable small pericardial effusion/thickening and mild sigmoid diverticulosis.   01/13/2017 Imaging   CT chest with contrast IMPRESSION: 1. No change in size of the hypermetabolic nodular thickening in the RIGHT upper lobe. 2. LEFT upper lobe spiculated nodule is stable. 3. Rounded LEFT lower lobe pulmonary nodule is stable. 4. No evidence of lung cancer progression.   05/08/2017 Adverse Reaction   Development of Hypothyroidism- likely secondary to immunotherapy.  Levothyroxine therapy initiated.       CANCER STAGING: Cancer Staging Non-small cell lung cancer, right [facility]) Staging form: Lung, AJCC 8th Edition - Clinical stage from 06/05/2016: Stage IIIA (cT1b(2), cN2, cM0) - Signed by Baird Cancer, PA-C on 08/21/2016    INTERVAL HISTORY:  [name] 74 y.o. male seen for follow-up of lung cancer.  He had CT scans done.  Appetite is 50%.  Energy levels are 25%.  His main complaint is he is not able to stay asleep.  He has tried trazodone by increasing the dose to 100 mg.  It did not help.  Hence he quit taking trazodone.  Numbness in the hands and feet has been stable.  Constipation is also stable.  Shortness of breath is unchanged.    REVIEW OF SYSTEMS:  Review  of Systems  Respiratory: Positive for shortness of breath.   Cardiovascular: Positive for leg swelling.  Gastrointestinal: Positive for constipation.  Neurological: Positive for numbness.  Psychiatric/Behavioral: Positive for sleep disturbance.  All other systems reviewed and are negative.    PAST MEDICAL/SURGICAL HISTORY:  Past Medical History:  Diagnosis Date  . Adenocarcinoma of lung, right ([place]) 04/18/2016  . Anxiety   . Arthritis   . Asthma   . CHF (congestive heart failure) ([name])   . COPD (chronic obstructive pulmonary disease) ([facility])   . Depression   . Diabetes mellitus without complication ([facility])    no meds  . DM (diabetes mellitus) ([place]) 07/09/2016  . Dyspnea   . History of kidney stones   . Hyperlipidemia   . Hypertension   . Macular degeneration   . Neuropathy   . Non-small cell lung cancer, right ([facility]) 04/18/2016  . Prostatitis   . Pulmonary nodule, left 07/16/2016  . Sleep apnea    cpap   Past Surgical History:  Procedure Laterality Date  . CATARACT EXTRACTION, BILATERAL Bilateral   . NO PAST SURGERIES    . PORTACATH PLACEMENT Left 06/13/2016   Procedure: INSERTION PORT-A-CATH;  Surgeon: Aviva Signs, MD;  Location: [facility];  Service: General;  Laterality: Left;  Marland Kitchen VIDEO BRONCHOSCOPY WITH ENDOBRONCHIAL NAVIGATION N/A 05/28/2016   Procedure: VIDEO BRONCHOSCOPY WITH ENDOBRONCHIAL NAVIGATION;  Surgeon: [name], MD;  Location: [facility];  Service: Thoracic;  Laterality: N/A;  .  VIDEO BRONCHOSCOPY WITH ENDOBRONCHIAL ULTRASOUND N/A 05/28/2016   Procedure: VIDEO BRONCHOSCOPY WITH ENDOBRONCHIAL ULTRASOUND;  Surgeon: [name], MD;  Location: [facility];  Service: Thoracic;  Laterality: N/A;     SOCIAL HISTORY:  Social History   Socioeconomic History  . Marital status: Divorced    Spouse name: Not on file  . Number of children: 5  . Years of education: 10  . Highest education level: 10th grade  Occupational History  . Occupation: Retired  Tobacco Use    . Smoking status: Current Every Day Smoker    Packs/day: 0.10    Years: 55.00    Pack years: 5.50    Types: Cigarettes    Start date: 03/11/1961  . Smokeless tobacco: Never Used  . Tobacco comment: smoking 1 to 2 cigarettes per day  Substance and Sexual Activity  . Alcohol use: No  . Drug use: No  . Sexual activity: Yes    Birth control/protection: None  Other Topics Concern  . Not on file  Social History Narrative  . Not on file   Social Determinants of Health   Financial Resource Strain: Low Risk   . Difficulty of Paying Living Expenses: Not hard at all  Food Insecurity: No Food Insecurity  . Worried About Charity fundraiser in the Last Year: Never true  . Ran Out of Food in the Last Year: Never true  Transportation Needs: No Transportation Needs  . Lack of Transportation (Medical): No  . Lack of Transportation (Non-Medical): No  Physical Activity: Inactive  . Days of Exercise per Week: 0 days  . Minutes of Exercise per Session: 0 min  Stress: No Stress Concern Present  . Feeling of Stress : Not at all  Social Connections: Somewhat Isolated  . Frequency of Communication with Friends and Family: More than three times a week  . Frequency of Social Gatherings with Friends and Family: More than three times a week  . Attends Religious Services: Never  . Active Member of Clubs or Organizations: No  . Attends Archivist Meetings: Never  . Marital Status: Living with partner  Intimate Partner Violence: Not At Risk  . Fear of Current or Ex-Partner: No  . Emotionally Abused: No  . Physically Abused: No  . Sexually Abused: No    FAMILY HISTORY:  Family History  Problem Relation Age of Onset  . Hypertension Mother   . Diabetes Father   . Heart disease Father   . Stroke Father   . Hypertension Sister     CURRENT MEDICATIONS:  Outpatient Encounter Medications as of 07/05/2019  Medication Sig Note  . amLODipine (NORVASC) 5 MG tablet Take 1 tablet (5 mg total)  by mouth daily.   Marland Kitchen aspirin EC 81 MG tablet Take 81 mg by mouth daily.   . cloNIDine (CATAPRES) 0.1 MG tablet TAKE 1 TABLET (0.1 MG TOTAL) BY MOUTH 3 (THREE) TIMES DAILY.   Marland Kitchen dutasteride (AVODART) 0.5 MG capsule TAKE 1 CAPSULE BY MOUTH EVERY DAY   . fluticasone furoate-vilanterol (BREO ELLIPTA) 200-25 MCG/INH AEPB Inhale 1 puff into the lungs daily.   . furosemide (LASIX) 20 MG tablet Take 1 tablet (20 mg total) by mouth 2 (two) times daily.   Marland Kitchen gabapentin (NEURONTIN) 300 MG capsule TAKE 1 CAPSULE BY MOUTH IN THE MORNING AND 2 CAPSULES AT NIGHT   . levothyroxine (SYNTHROID) 75 MCG tablet Take 1 tablet (75 mcg total) by mouth daily.   Marland Kitchen linagliptin (TRADJENTA) 5 MG TABS tablet  Take 1 tablet (5 mg total) by mouth daily.   Marland Kitchen lisinopril (ZESTRIL) 40 MG tablet Take 1 tablet (40 mg total) by mouth daily.   . metFORMIN (GLUCOPHAGE-XR) 500 MG 24 hr tablet TAKE 1 TABLET BY MOUTH EVERY DAY WITH BREAKFAST   . pravastatin (PRAVACHOL) 40 MG tablet Take 1 tablet (40 mg total) by mouth every 3 (three) days. M-W-F   . tamsulosin (FLOMAX) 0.4 MG CAPS capsule Take 2 capsules (0.8 mg total) by mouth daily.   Marland Kitchen umeclidinium bromide (INCRUSE ELLIPTA) 62.5 MCG/INH AEPB Inhale 1 puff into the lungs daily.   Marland Kitchen albuterol (VENTOLIN HFA) 108 (90 Base) MCG/ACT inhaler USE 2 PUFF BY MOUTH EVERY 6 HOURS AS NEEDED FOR SHORTNESS OF BREATH OR WHEEZING (Patient not taking: Reported on 07/05/2019)   . fluticasone (FLONASE) 50 MCG/ACT nasal spray Place 2 sprays into both nostrils daily. (Patient not taking: Reported on 07/05/2019) 12/05/2016: As needed  . meloxicam (MOBIC) 7.5 MG tablet Take 1 tablet (7.5 mg total) by mouth daily as needed for pain (back pain). (Patient not taking: Reported on 07/05/2019)   . prochlorperazine (COMPAZINE) 10 MG tablet Take 1 tablet (10 mg total) by mouth every 6 (six) hours as needed for nausea or vomiting. (Patient not taking: Reported on 07/05/2019)   . temazepam (RESTORIL) 30 MG capsule Take 1 capsule  (30 mg total) by mouth at bedtime as needed for sleep.    No facility-administered encounter medications on file as of 07/05/2019.    ALLERGIES:  No Known Allergies   PHYSICAL EXAM:  ECOG Performance status: 1  Vitals:   07/05/19 1445  BP: 139/65  Pulse: (!) 114  Resp: 18  Temp: (!) 96 F (35.6 C)  SpO2: 97%   Filed Weights   07/05/19 1445  Weight: 181 lb 12.8 oz (82.5 kg)    Physical Exam Vitals reviewed.  Constitutional:      Appearance: Normal appearance.  Cardiovascular:     Rate and Rhythm: Normal rate and regular rhythm.     Heart sounds: Normal heart sounds.  Pulmonary:     Effort: Pulmonary effort is normal.     Breath sounds: Normal breath sounds.  Abdominal:     General: There is no distension.     Palpations: Abdomen is soft. There is no mass.  Musculoskeletal:        General: No swelling.  Skin:    General: Skin is warm.  Neurological:     General: No focal deficit present.     Mental Status: He is alert and oriented to person, place, and time.  Psychiatric:        Mood and Affect: Mood normal.        Behavior: Behavior normal.      LABORATORY DATA:  I have reviewed the labs as listed.  CBC    Component Value Date/Time   WBC 9.2 06/28/2019 0957   RBC 4.88 06/28/2019 0957   HGB 15.3 06/28/2019 0957   HGB 14.9 04/25/2019 1608   HCT 47.5 06/28/2019 0957   HCT 44.6 04/25/2019 1608   PLT 302 06/28/2019 0957   PLT 271 04/25/2019 1608   MCV 97.3 06/28/2019 0957   MCV 90 04/25/2019 1608   MCH 31.4 06/28/2019 0957   MCHC 32.2 06/28/2019 0957   RDW 16.6 (H) 06/28/2019 0957   RDW 14.0 04/25/2019 1608   LYMPHSABS 0.7 06/28/2019 0957   LYMPHSABS 0.7 04/25/2019 1608   MONOABS 0.9 06/28/2019 0957   EOSABS 0.2 06/28/2019 0957  EOSABS 0.2 04/25/2019 1608   BASOSABS 0.1 06/28/2019 0957   BASOSABS 0.1 04/25/2019 1608   CMP Latest Ref Rng & Units 06/28/2019 05/17/2019 04/25/2019  Glucose 70 - 99 mg/dL 196(H) 190(H) 160(H)  BUN 8 - 23 mg/dL 14 13  11   Creatinine 0.61 - 1.24 mg/dL 0.79 0.81 0.77  Sodium 135 - 145 mmol/L 136 141 142  Potassium 3.5 - 5.1 mmol/L 3.9 4.3 4.0  Chloride 98 - 111 mmol/L 98 101 98  CO2 22 - 32 mmol/L 27 21 26   Calcium 8.9 - 10.3 mg/dL 9.3 9.2 9.6  Total Protein 6.5 - 8.1 g/dL 8.1 - 6.9  Total Bilirubin 0.3 - 1.2 mg/dL 0.5 - 0.3  Alkaline Phos 38 - 126 U/L 111 - 137(H)  AST 15 - 41 U/L 22 - 14  ALT 0 - 44 U/L 18 - 11       DIAGNOSTIC IMAGING:  I have reviewed independently CT scan and discussed with him.      ASSESSMENT & PLAN:   Non-small cell lung cancer, right ([facility]) 1.  Stage IIIa (T1b N2 cM0) NSCLC, favoring adenocarcinoma: -Chemo XRT with weekly carboplatin and paclitaxel completed on 08/20/2016. -Consolidation durvalumab from 09/26/2016 through 09/25/2017. -We reviewed results of the CT of the chest with contrast from 06/28/2019 which was compared to PET scan from 03/28/2019.  Stable radiation change within the right upper lobe.  Stable appearance of pleural thickening along the paravertebral aspect of the superior aspect of the right lower lobe.  Small nodules in the right upper lobe slightly increased from 4 mm to 5 mm. -I have reviewed his labs including LFTs which are within normal limits. -We will repeat CT scan in 4 months for follow-up.  2.  Hypothyroidism: -He is taking Synthroid 75 mcg daily.  TSH today is 1.06.  3.  Difficulty staying asleep: -He has tried trazodone in the past which did not help. -He reports that he cannot stay asleep after 2 to 3 hours of going to sleep.  We will start him on temazepam 30 mg daily as needed.  We discussed the side effects in detail.    Orders placed this encounter:  Orders Placed This Encounter  Procedures  . CT Chest W Contrast  . CBC with Differential/Platelet  . Comprehensive metabolic panel      Derek Jack, MD [place] [phone]

## 2019-07-05 NOTE — Assessment & Plan Note (Signed)
1.  Stage IIIa (T1b N2 cM0) NSCLC, favoring adenocarcinoma: -Chemo XRT with weekly carboplatin and paclitaxel completed on 08/20/2016. -Consolidation durvalumab from 09/26/2016 through 09/25/2017. -We reviewed results of the CT of the chest with contrast from 06/28/2019 which was compared to PET scan from 03/28/2019.  Stable radiation change within the right upper lobe.  Stable appearance of pleural thickening along the paravertebral aspect of the superior aspect of the right lower lobe.  Small nodules in the right upper lobe slightly increased from 4 mm to 5 mm. -I have reviewed his labs including LFTs which are within normal limits. -We will repeat CT scan in 4 months for follow-up.  2.  Hypothyroidism: -He is taking Synthroid 75 mcg daily.  TSH today is 1.06.  3.  Difficulty staying asleep: -He has tried trazodone in the past which did not help. -He reports that he cannot stay asleep after 2 to 3 hours of going to sleep.  We will start him on temazepam 30 mg daily as needed.  We discussed the side effects in detail.

## 2019-07-06 ENCOUNTER — Telehealth (HOSPITAL_COMMUNITY): Payer: Self-pay | Admitting: *Deleted

## 2019-07-07 ENCOUNTER — Other Ambulatory Visit (HOSPITAL_COMMUNITY): Payer: Self-pay | Admitting: *Deleted

## 2019-07-07 ENCOUNTER — Other Ambulatory Visit (HOSPITAL_COMMUNITY): Payer: Self-pay | Admitting: Nurse Practitioner

## 2019-07-07 DIAGNOSIS — G479 Sleep disorder, unspecified: Secondary | ICD-10-CM

## 2019-07-07 MED ORDER — ZOLPIDEM TARTRATE 5 MG PO TABS: 5.0000 mg | ORAL_TABLET | Freq: Every evening | ORAL | 0 refills | Status: DC | PRN

## 2019-07-27 DIAGNOSIS — H26492 Other secondary cataract, left eye: Secondary | ICD-10-CM | POA: Diagnosis not present

## 2019-07-27 DIAGNOSIS — H31003 Unspecified chorioretinal scars, bilateral: Secondary | ICD-10-CM | POA: Diagnosis not present

## 2019-07-27 DIAGNOSIS — Z961 Presence of intraocular lens: Secondary | ICD-10-CM | POA: Diagnosis not present

## 2019-07-27 DIAGNOSIS — E119 Type 2 diabetes mellitus without complications: Secondary | ICD-10-CM | POA: Diagnosis not present

## 2019-07-27 LAB — HM DIABETES EYE EXAM

## 2019-08-18 ENCOUNTER — Other Ambulatory Visit: Payer: Self-pay | Admitting: Family

## 2019-08-18 DIAGNOSIS — E1142 Type 2 diabetes mellitus with diabetic polyneuropathy: Secondary | ICD-10-CM

## 2019-08-22 ENCOUNTER — Other Ambulatory Visit: Payer: Self-pay | Admitting: Family

## 2019-08-30 ENCOUNTER — Other Ambulatory Visit (HOSPITAL_COMMUNITY): Payer: Self-pay | Admitting: Nurse Practitioner

## 2019-08-30 DIAGNOSIS — G479 Sleep disorder, unspecified: Secondary | ICD-10-CM

## 2019-09-02 ENCOUNTER — Other Ambulatory Visit: Payer: Self-pay | Admitting: Family

## 2019-09-13 ENCOUNTER — Other Ambulatory Visit: Payer: Self-pay

## 2019-09-13 ENCOUNTER — Encounter: Payer: Self-pay | Admitting: Family

## 2019-09-13 ENCOUNTER — Ambulatory Visit (INDEPENDENT_AMBULATORY_CARE_PROVIDER_SITE_OTHER): Payer: Medicare Other | Admitting: Family

## 2019-09-13 VITALS — BP 135/70 | HR 104 | Temp 97.8°F | Ht 66.0 in | Wt 183.4 lb

## 2019-09-13 DIAGNOSIS — I739 Peripheral vascular disease, unspecified: Secondary | ICD-10-CM | POA: Diagnosis not present

## 2019-09-13 DIAGNOSIS — M8949 Other hypertrophic osteoarthropathy, multiple sites: Secondary | ICD-10-CM | POA: Diagnosis not present

## 2019-09-13 DIAGNOSIS — E1169 Type 2 diabetes mellitus with other specified complication: Secondary | ICD-10-CM

## 2019-09-13 DIAGNOSIS — F17200 Nicotine dependence, unspecified, uncomplicated: Secondary | ICD-10-CM | POA: Diagnosis not present

## 2019-09-13 DIAGNOSIS — E039 Hypothyroidism, unspecified: Secondary | ICD-10-CM

## 2019-09-13 DIAGNOSIS — N401 Enlarged prostate with lower urinary tract symptoms: Secondary | ICD-10-CM

## 2019-09-13 DIAGNOSIS — R3912 Poor urinary stream: Secondary | ICD-10-CM

## 2019-09-13 DIAGNOSIS — C3491 Malignant neoplasm of unspecified part of right bronchus or lung: Secondary | ICD-10-CM | POA: Diagnosis not present

## 2019-09-13 DIAGNOSIS — I10 Essential (primary) hypertension: Secondary | ICD-10-CM | POA: Diagnosis not present

## 2019-09-13 DIAGNOSIS — I509 Heart failure, unspecified: Secondary | ICD-10-CM

## 2019-09-13 DIAGNOSIS — E1142 Type 2 diabetes mellitus with diabetic polyneuropathy: Secondary | ICD-10-CM

## 2019-09-13 DIAGNOSIS — E785 Hyperlipidemia, unspecified: Secondary | ICD-10-CM

## 2019-09-13 DIAGNOSIS — J431 Panlobular emphysema: Secondary | ICD-10-CM

## 2019-09-13 DIAGNOSIS — F5101 Primary insomnia: Secondary | ICD-10-CM | POA: Diagnosis not present

## 2019-09-13 DIAGNOSIS — M159 Polyosteoarthritis, unspecified: Secondary | ICD-10-CM

## 2019-09-13 LAB — CMP14+EGFR
ALT: 13 IU/L (ref 0–44)
AST: 16 IU/L (ref 0–40)
Albumin/Globulin Ratio: 1.4 (ref 1.2–2.2)
Albumin: 4.2 g/dL (ref 3.7–4.7)
Alkaline Phosphatase: 128 IU/L — ABNORMAL HIGH (ref 48–121)
BUN/Creatinine Ratio: 13 (ref 10–24)
BUN: 11 mg/dL (ref 8–27)
Bilirubin Total: 0.2 mg/dL (ref 0.0–1.2)
CO2: 24 mmol/L (ref 20–29)
Calcium: 9.6 mg/dL (ref 8.6–10.2)
Chloride: 99 mmol/L (ref 96–106)
Creatinine, Ser: 0.85 mg/dL (ref 0.76–1.27)
GFR calc Af Amer: 100 mL/min/{1.73_m2} (ref >59)
GFR calc non Af Amer: 86 mL/min/{1.73_m2} (ref >59)
Globulin, Total: 3 g/dL (ref 1.5–4.5)
Glucose: 184 mg/dL — ABNORMAL HIGH (ref 65–99)
Potassium: 4 mmol/L (ref 3.5–5.2)
Sodium: 137 mmol/L (ref 134–144)
Total Protein: 7.2 g/dL (ref 6.0–8.5)

## 2019-09-13 LAB — BAYER DCA HB A1C WAIVED: HB A1C (BAYER DCA - WAIVED): 9.1 % — ABNORMAL HIGH (ref <7.0)

## 2019-09-13 NOTE — Patient Instructions (Signed)
Peripheral Vascular Disease Peripheral vascular disease (PVD) is a disease of the blood vessels. A simple term for PVD is poor circulation. In most cases, PVD narrows the blood vessels that carry blood from your heart to the rest of your body. This can result in a decreased supply of blood to your arms, legs, and internal organs, like your stomach or kidneys. However, it most often affects a persons lower legs and feet. There are two types of PVD.  Organic PVD. This is the more common type. It is caused by damage to the structure of blood vessels.  Functional PVD. This is caused by conditions that make blood vessels contract and tighten (spasm). Without treatment, PVD tends to get worse over time. PVD can also lead to acute limb ischemia. This is when an arm or leg suddenly has trouble getting enough blood. This is a medical emergency. What are the causes?  Each type of PVD has many different causes. The most common cause of PVD is buildup of a fatty material (plaque) inside your arteries (atherosclerosis). Small amounts of plaque can break off from the walls of the blood vessels and become lodged in a smaller artery. This blocks blood flow and can cause acute limb ischemia. Other common causes of PVD include:  Blood clots that form inside of blood vessels.  Injuries to blood vessels.  Diseases that cause inflammation of blood vessels or cause blood vessel spasms.  Health behaviors and health history that increase your risk of developing PVD. What increases the risk? You are more likely to develop this condition if:  You have a family history of PVD.  You have certain medical conditions, including: ? High cholesterol. ? Diabetes. ? High blood pressure (hypertension). ? Coronary heart disease. ? Past problems with blood clots. ? Past injury, such as burns or a broken bone. These may have damaged blood vessels in your limbs. ? Buerger disease. This is caused by inflamed blood vessels  in your hands and feet. ? Some forms of arthritis. ? Rare birth defects that affect the arteries in your legs. ? Kidney disease.  You use tobacco or smoke.  You do not get enough exercise.  You are obese.  You are age 74 or older. What are the signs or symptoms? This condition may cause different symptoms. Your symptoms depend on what part of your body is not getting enough blood. Some common signs and symptoms include:  Cramps in your lower legs. This may be a symptom of poor leg circulation (claudication).  Pain and weakness in your legs. This happens while you are physically active but goes away when you rest (intermittent claudication).  Leg pain when at rest.  Leg numbness, tingling, or weakness.  Coldness in a leg or foot, especially when compared with the other leg.  Skin or hair changes. These can include: ? Hair loss. ? Shiny skin. ? Pale or bluish skin. ? Thick toenails.  Inability to get or maintain an erection (erectile dysfunction).  Fatigue. People with PVD are more likely to develop ulcers and sores on their toes, feet, or legs. These may take longer than normal to heal. How is this diagnosed? This condition is diagnosed based on:  Your signs and symptoms.  A physical exam and your medical history.  Other tests to find out what is causing your PVD and to determine its severity. Tests may include: ? Blood pressure recordings from your arms and legs and measurements of the strength of your pulses (pulse volume  recordings). ? Imaging studies using sound waves to take pictures of the blood flow through your blood vessels (Doppler ultrasound). ? Injecting a dye into your blood vessels before having imaging studies using:  X-rays (angiogram or arteriogram).  Computer-generated X-rays (CT angiogram).  A powerful electromagnetic field and a computer (magnetic resonance angiogram or MRA). How is this treated? Treatment for PVD depends on the cause of your  condition and how severe your symptoms are. It also depends on your age. Underlying causes need to be treated and controlled. These include long-term (chronic) conditions, such as diabetes, high cholesterol, and high blood pressure. Treatment includes:  Lifestyle changes, such as: ? Quitting smoking. ? Exercising regularly. ? Following a low-fat, low-cholesterol diet.  Taking medicines, such as: ? Blood thinners to prevent blood clots. ? Medicines to improve blood flow. ? Medicines to improve your blood cholesterol levels.  Surgical procedures, such as: ? A procedure that uses an inflated balloon to open a blocked artery and improve blood flow (angioplasty). ? A procedure to put in a wire mesh tube to keep a blocked artery open (stent implant). ? Surgery to reroute blood flow around a blocked artery (peripheral bypass surgery). ? Surgery to remove dead tissue from an infected wound on the affected limb. ? Amputation. This is surgical removal of the affected limb. It may be necessary in cases of acute limb ischemia where there has been no improvement through medical or surgical treatments. Follow these instructions at home: Lifestyle  Do not use any products that contain nicotine or tobacco, such as cigarettes and e-cigarettes. If you need help quitting, ask your health care provider.  Lose weight if you are overweight, and maintain a healthy weight as discussed by your health care provider.  Eat a diet that is low in fat and cholesterol. If you need help, ask your health care provider.  Exercise regularly. Ask your health care provider to suggest some good activities for you. General instructions  Take over-the-counter and prescription medicines only as told by your health care provider.  Take good care of your feet: ? Wear comfortable shoes that fit well. ? Check your feet often for any cuts or sores.  Keep all follow-up visits as told by your health care provider. This is  important. Contact a health care provider if:  You have cramps in your legs while walking.  You have leg pain when you are at rest.  You have coldness in a leg or foot.  Your skin changes.  You have erectile dysfunction.  You have cuts or sores on your feet that are not healing. Get help right away if:  Your arm or leg turns cold, numb, and blue.  Your arms or legs become red, warm, swollen, painful, or numb.  You have chest pain or trouble breathing.  You suddenly have weakness in your face, arm, or leg.  You become very confused or lose the ability to speak.  You suddenly have a very bad headache or lose your vision. Summary  Peripheral vascular disease (PVD) is a disease of the blood vessels.  In most cases, PVD narrows the blood vessels that carry blood from your heart to the rest of your body.  PVD may cause different symptoms. Your symptoms depend on what part of your body is not getting enough blood.  Treatment for PVD depends on the cause of your condition and how severe your symptoms are. This information is not intended to replace advice given to you by your  health care provider. Make sure you discuss any questions you have with your health care provider. Document Revised: 03/06/2017 Document Reviewed: 05/01/2016 Elsevier Patient Education  [place]. Intermittent Claudication Intermittent claudication is pain in one or both legs that occurs when walking or exercising and goes away when resting. Intermittent claudication is a symptom of peripheral arterial disease (PAD). This condition is commonly treated with rest, medicine, and healthy lifestyle changes. If medical management does not improve symptoms, surgery can be done to restore blood flow (revascularization) to the affected leg. What are the causes?  This condition is caused by buildup of fatty material (plaque) within the major arteries in the body (atherosclerosis). Plaque makes arteries stiff  and narrow, which prevents enough blood from reaching the leg muscles. Pain occurs when you walk or exercise because your muscles need (but cannot get) more blood when you are moving and exercising. What increases the risk? The following factors may make you more likely to develop this condition:  A family history of atherosclerosis.  A personal history of stroke or heart disease.  Older age.  Being inactive (sedentary lifestyle).  Being overweight.  Smoking cigarettes.  Having another health condition such as: ? Diabetes. ? High blood pressure. ? High cholesterol. What are the signs or symptoms? Symptoms of this condition may first develop in the lower leg, and then they may spread to the thigh, hip, buttock, or the back of the lower leg (calf) over time. Symptoms may include:  Aches or pains.  Cramps.  A feeling of tightness, weakness, or heaviness.  A wound on the lower leg or foot that heals poorly or does not heal. How is this diagnosed? This condition may be diagnosed based on:  Your symptoms.  Your medical history.  Tests, such as: ? Blood tests. ? Arterial duplex ultrasound. This test uses images of blood vessels and surrounding organs to evaluate blood flow within arteries. ? Angiogram. In this procedure, dye is injected into arteries and then X-rays are taken. ? Magnetic resonance angiogram (MRA). In this procedure, strong magnets and radio waves are used instead of X-rays to create images of blood vessels and blood flow. ? CT angiogram (CTA). In this procedure, a large X-ray machine called a CT scanner takes detailed pictures of blood vessels that have been injected with dye. ? Ankle-brachial index (ABI) test. This procedure measures blood pressure in the leg during exercise and at rest. ? Exercise test. For this test, you will walk on a treadmill while tests are done (such as the ABI test) to evaluate how this condition affects your ability to walk or  exercise. How is this treated? Treatment for this condition may involve treatment for the underlying cause, such as treatment for high blood pressure, high cholesterol, or diabetes. Treatment may include:  Lifestyle changes such as: ? Starting a supervised or home-based exercise program. ? Losing weight. ? Quitting smoking.  Medicines to help restore blood flow through your legs.  Blood vessel surgery (angioplasty) to restore blood flow around the blocked vessel. This is also known as endovascular therapy (EVT). This is only done if your intermittent claudication is caused by severe peripheral artery disease, a condition in which blood flow is severely or totally restricted by the narrowing of the arteries. Follow these instructions at home: Lifestyle   Maintain a healthy weight.  Eat a diet that is low in saturated fats and calories. Consider working with a diet and nutrition specialist (dietitian) to help you make healthy  food choices.  Do not use any products that contain nicotine or tobacco, such as cigarettes and e-cigarettes. If you need help quitting, ask your health care provider.  If your health care provider recommended an exercise program for you, follow it as directed. Your exercise program may involve: ? Walking 3 or more times a week. ? Walking until you have certain symptoms of intermittent claudication. ? Resting until symptoms go away. ? Gradually increasing your walking time to about 50 minutes a day. General instructions  Work with your health care provider to manage any other health conditions you may have, including diabetes, high blood pressure, or high cholesterol.  Take over-the-counter and prescription medicines only as told by your health care provider.  Keep all follow-up visits as told by your health care provider. This is important. Contact a health care provider if:  Your pain does not go away with rest.  You have sores on your legs that do not heal  or have a bad smell or pus coming from them.  Your condition gets worse or does not get better with treatment. Get help right away if:  You have chest pain.  You have difficulty breathing.  You develop arm weakness.  You have trouble speaking.  Your face begins to droop.  Your foot or leg is cold or it changes color.  Your foot or leg becomes numb. These symptoms may represent a serious problem that is an emergency. Do not wait to see if the symptoms will go away. Get medical help right away. Call your local emergency services (911 in the [place] Do not drive yourself to the hospital.  Summary  Intermittent claudication is pain in one or both legs that occurs when walking or exercising and goes away when resting.  This condition is caused by buildup of fatty material (plaque) within the major arteries in the body (atherosclerosis). Plaque makes arteries stiff and narrow, which prevents enough blood from reaching the leg muscles.  Intermittent claudication can be treated with medicine and lifestyle changes. If medical treatment fails, surgery can be done to help return blood flow to the affected area.  Make sure you work with your health care provider to manage any other health conditions you may have, including diabetes, high blood pressure, or high cholesterol. This information is not intended to replace advice given to you by your health care provider. Make sure you discuss any questions you have with your health care provider. Document Revised: 03/06/2017 Document Reviewed: 04/24/2016 Elsevier Patient Education  2020 [employer].

## 2019-09-13 NOTE — Progress Notes (Signed)
Subjective:    Patient ID: John [name], male    DOB: [birth year], 74 y.o.   MRN: [id number]  Chief Complaint  Patient presents with  . Diabetes    WANTS RIGHT FOOT LOOKED AT   . Hypertension    AMOLDIPINE MAKES LEGS WEAK   Pt presents to the [facility] for  chronic care follow up. He is followed by Cardiologists  for CHF. Followed by Oncologists every 6 months for non-small lung cancer.   Pt complaining of bilateral leg weakness that occurs when he is walking. He reports aching pain in calf of a 10 out 10, but improves when he rests. He noticed discoloration of his right foot and leg with a brown discoloration.  Diabetes He presents for his follow-up diabetic visit. He has type 2 diabetes mellitus. His disease course has been stable. There are no hypoglycemic associated symptoms. Associated symptoms include fatigue and foot paresthesias. Pertinent negatives for diabetes include no blurred vision. Symptoms are stable. Diabetic complications include heart disease and peripheral neuropathy. Risk factors for coronary artery disease include dyslipidemia, diabetes mellitus, male sex, hypertension and sedentary lifestyle. (Does not check BS at home ) An ACE inhibitor/angiotensin II receptor blocker is being taken. Eye exam is current.  Hypertension This is a chronic problem. The current episode started more than 1 year ago. The problem has been resolved since onset. The problem is controlled. Associated symptoms include malaise/fatigue, peripheral edema and shortness of breath. Pertinent negatives include no blurred vision. Risk factors for coronary artery disease include dyslipidemia, diabetes mellitus, obesity, male gender, smoking/tobacco exposure and sedentary lifestyle. The current treatment provides moderate improvement. Hypertensive end-organ damage includes CAD/MI and heart failure. Identifiable causes of hypertension include a thyroid problem.  Congestive Heart Failure Presents for  follow-up visit. Associated symptoms include fatigue, nocturia (1-2 ) and shortness of breath. Pertinent negatives include no unexpected weight change. The symptoms have been stable.  Thyroid Problem Presents for follow-up visit. Symptoms include constipation, dry skin and fatigue. The symptoms have been stable. His past medical history is significant for heart failure.  Benign Prostatic Hypertrophy This is a chronic problem. The current episode started more than 1 year ago. Irritative symptoms include nocturia (1-2 ).  Insomnia Primary symptoms: difficulty falling asleep, frequent awakening, malaise/fatigue.  The onset quality is sudden. The problem occurs intermittently. The problem has been waxing and waning since onset.  Nicotine Dependence Presents for follow-up visit. Symptoms include fatigue and insomnia. His urge triggers include company of smokers. The symptoms have been stable. He smokes < 1/2 a pack of cigarettes per day.  COPD States he smokes 1/2-1 pack a day. States humid makes it worse, uses his Breo daily.  OSA Uses CPAP nightly stable.     Review of Systems  Constitutional: Positive for fatigue and malaise/fatigue. Negative for unexpected weight change.  Eyes: Negative for blurred vision.  Respiratory: Positive for shortness of breath.   Gastrointestinal: Positive for constipation.  Genitourinary: Positive for nocturia (1-2 ).  Psychiatric/Behavioral: The patient has insomnia.        Objective:   Physical Exam Vitals reviewed.  Constitutional:      General: He is not in acute distress.    Appearance: He is well-developed.  HENT:     Head: Normocephalic.     Right Ear: Tympanic membrane normal.     Left Ear: Tympanic membrane normal.  Eyes:     General:        Right eye: No discharge.  Left eye: No discharge.     Pupils: Pupils are equal, round, and reactive to light.  Neck:     Thyroid: No thyromegaly.  Cardiovascular:     Rate and Rhythm: Normal  rate and regular rhythm.     Heart sounds: Murmur present.  Pulmonary:     Effort: Pulmonary effort is normal. No respiratory distress.     Breath sounds: Decreased breath sounds present. No wheezing.  Abdominal:     General: Bowel sounds are normal. There is no distension.     Palpations: Abdomen is soft.     Tenderness: There is no abdominal tenderness.  Musculoskeletal:        General: No tenderness. Normal range of motion.     Cervical back: Normal range of motion and neck supple.     Right lower leg: Edema (2+) present.     Left lower leg: Edema (2+) present.  Skin:    General: Skin is warm and dry.     Findings: No erythema or rash.  Neurological:     Mental Status: He is alert and oriented to person, place, and time.     Cranial Nerves: No cranial nerve deficit.     Deep Tendon Reflexes: Reflexes are normal and symmetric.  Psychiatric:        Behavior: Behavior normal.        Thought Content: Thought content normal.        Judgment: Judgment normal.          BP 135/70   Pulse (!) 104   Temp 97.8 F (36.6 C) (Temporal)   Ht 5' 6"  (1.676 m)   Wt 183 lb 6.4 oz (83.2 kg)   SpO2 95%   BMI 29.60 kg/m   Assessment & Plan:  Kerby Borner comes in today with chief complaint of Diabetes (WANTS RIGHT FOOT LOOKED AT ) and Hypertension (AMOLDIPINE MAKES LEGS WEAK)   Diagnosis and orders addressed:  1. Type 2 diabetes mellitus with diabetic polyneuropathy, without long-term current use of insulin (HCC) - Bayer DCA Hb A1c Waived - CMP14+EGFR  2. Congestive heart failure, unspecified HF chronicity, unspecified heart failure type ([place]) - CMP14+EGFR  3. Essential hypertension - CMP14+EGFR  4. Panlobular emphysema (HCC) - CMP14+EGFR  5. Non-small cell lung cancer, right ([place]) - CMP14+EGFR  6. Hyperlipidemia associated with type 2 diabetes mellitus (HCC) - CMP14+EGFR  7. Hypothyroidism, unspecified type - CMP14+EGFR  8. Benign prostatic hyperplasia with weak  urinary stream - CMP14+EGFR  9. Primary insomnia - CMP14+EGFR  10. Current smoker - CMP14+EGFR  11. Primary osteoarthritis involving multiple joints - CMP14+EGFR  12. PAD (peripheral artery disease) (HCC) Smoking cessation encouraged Will place referral to Vascular - Ambulatory referral to Vascular Surgery - CMP14+EGFR  13. Intermittent claudication ([place]) - Ambulatory referral to Vascular Surgery - CMP14+EGFR   Labs pending Health Maintenance reviewed Diet and exercise encouraged  Follow up plan: 4 months    Evelina Dun, FNP

## 2019-09-16 ENCOUNTER — Other Ambulatory Visit: Payer: Self-pay | Admitting: Family

## 2019-09-16 MED ORDER — EMPAGLIFLOZIN 10 MG PO TABS
10.0000 mg | ORAL_TABLET | Freq: Every day | ORAL | 3 refills | Status: DC
Start: 2019-09-16 — End: 2019-09-23

## 2019-09-19 ENCOUNTER — Telehealth: Payer: Self-pay

## 2019-09-19 ENCOUNTER — Telehealth: Payer: Self-pay | Admitting: Family

## 2019-09-19 NOTE — Telephone Encounter (Signed)
Aware of results. 

## 2019-09-19 NOTE — Telephone Encounter (Signed)
Patient aware of appointment

## 2019-09-19 NOTE — Telephone Encounter (Signed)
Spoke to patient in regards to his lab results and patient wanted to know about his vascular referral as his had not heard anything yet.  Referral was placed on 09/13/2019 - please review status and advise.

## 2019-09-20 DIAGNOSIS — L84 Corns and callosities: Secondary | ICD-10-CM | POA: Diagnosis not present

## 2019-09-20 DIAGNOSIS — B351 Tinea unguium: Secondary | ICD-10-CM | POA: Diagnosis not present

## 2019-09-20 DIAGNOSIS — E1152 Type 2 diabetes mellitus with diabetic peripheral angiopathy with gangrene: Secondary | ICD-10-CM | POA: Diagnosis not present

## 2019-09-20 DIAGNOSIS — M79676 Pain in unspecified toe(s): Secondary | ICD-10-CM | POA: Diagnosis not present

## 2019-09-20 NOTE — Telephone Encounter (Signed)
Spoke with Patient - Advised patient his ref is in review with the Vascular Office - Patient was given phone number to call and make appt since they have the Ref.

## 2019-09-21 ENCOUNTER — Other Ambulatory Visit: Payer: Self-pay

## 2019-09-21 ENCOUNTER — Encounter: Payer: Self-pay | Admitting: Student

## 2019-09-21 ENCOUNTER — Ambulatory Visit (INDEPENDENT_AMBULATORY_CARE_PROVIDER_SITE_OTHER): Payer: Medicare Other | Admitting: Student

## 2019-09-21 VITALS — BP 138/66 | HR 102 | Ht 66.0 in | Wt 182.0 lb

## 2019-09-21 DIAGNOSIS — I2510 Atherosclerotic heart disease of native coronary artery without angina pectoris: Secondary | ICD-10-CM

## 2019-09-21 DIAGNOSIS — I739 Peripheral vascular disease, unspecified: Secondary | ICD-10-CM

## 2019-09-21 DIAGNOSIS — J449 Chronic obstructive pulmonary disease, unspecified: Secondary | ICD-10-CM

## 2019-09-21 DIAGNOSIS — I10 Essential (primary) hypertension: Secondary | ICD-10-CM | POA: Diagnosis not present

## 2019-09-21 NOTE — Progress Notes (Deleted)
Cardiology Office Note    Date:  09/21/2019   ID:  John [name], DOB Nov 24, 1945, MRN [id number]  PCP:  [name], FNP  Cardiologist: [name], MD    No chief complaint on file.   History of Present Illness:    John [name] is a 74 y.o. male with past medical history of CAD (coronary calcifications by prior CT with NST in 2019 showing scar versus soft tissue with no ischemia), Type 2 DM, HTN, HLD, NSCLC, COPD, OSA (on CPAP) and tobacco use who presents to the office today for 22-month follow-up.   He was last examined by Dr. Bronson Ing in 11/2018 and reported having dyspnea on exertion when working outside in the warm temperatures but denied any chest pain or palpitations. Symptoms were overall felt to be secondary to COPD and given that he had been intolerant to Lopressor and Imdur in the past, Pulmonology follow-up was recommended with consideration of a catheterization in the future if symptoms were not felt to be of a pulmonary etiology.   [facility] Referral? - PAD?  Past Medical History:  Diagnosis Date  . Adenocarcinoma of lung, right ([place]) 04/18/2016  . Anxiety   . Arthritis   . Asthma   . CHF (congestive heart failure) ([place])   . COPD (chronic obstructive pulmonary disease) ([place])   . Depression   . Diabetes mellitus without complication ([facility])    no meds  . DM (diabetes mellitus) ([place]) 07/09/2016  . Dyspnea   . History of kidney stones   . Hyperlipidemia   . Hypertension   . Macular degeneration   . Neuropathy   . Non-small cell lung cancer, right ([place]) 04/18/2016  . Prostatitis   . Pulmonary nodule, left 07/16/2016  . Sleep apnea    cpap    Past Surgical History:  Procedure Laterality Date  . CATARACT EXTRACTION, BILATERAL Bilateral   . NO PAST SURGERIES    . PORTACATH PLACEMENT Left 06/13/2016   Procedure: INSERTION PORT-A-CATH;  Surgeon: Aviva Signs, MD;  Location: [facility];  Service: General;  Laterality: Left;  Marland Kitchen VIDEO BRONCHOSCOPY WITH  ENDOBRONCHIAL NAVIGATION N/A 05/28/2016   Procedure: VIDEO BRONCHOSCOPY WITH ENDOBRONCHIAL NAVIGATION;  Surgeon: [name], MD;  Location: [facility];  Service: Thoracic;  Laterality: N/A;  . VIDEO BRONCHOSCOPY WITH ENDOBRONCHIAL ULTRASOUND N/A 05/28/2016   Procedure: VIDEO BRONCHOSCOPY WITH ENDOBRONCHIAL ULTRASOUND;  Surgeon: [name], MD;  Location: [facility];  Service: Thoracic;  Laterality: N/A;    Current Medications: Outpatient Medications Prior to Visit  Medication Sig Dispense Refill  . albuterol (VENTOLIN HFA) 108 (90 Base) MCG/ACT inhaler USE 2 PUFF BY MOUTH EVERY 6 HOURS AS NEEDED FOR SHORTNESS OF BREATH OR WHEEZING 18 g 3  . amLODipine (NORVASC) 5 MG tablet Take 1 tablet (5 mg total) by mouth daily. 90 tablet 3  . aspirin EC 81 MG tablet Take 81 mg by mouth daily.    Marland Kitchen BREO ELLIPTA 200-25 MCG/INH AEPB TAKE 1 PUFF BY MOUTH EVERY DAY 180 each 0  . cloNIDine (CATAPRES) 0.1 MG tablet TAKE 1 TABLET (0.1 MG TOTAL) BY MOUTH 3 (THREE) TIMES DAILY. 270 tablet 1  . dutasteride (AVODART) 0.5 MG capsule TAKE 1 CAPSULE BY MOUTH EVERY DAY 90 capsule 1  . empagliflozin (JARDIANCE) 10 MG TABS tablet Take 1 tablet (10 mg total) by mouth daily before breakfast. 30 tablet 3  . fluticasone (FLONASE) 50 MCG/ACT nasal spray Place 2 sprays into both nostrils daily. 16 g 6  .  furosemide (LASIX) 20 MG tablet Take 1 tablet (20 mg total) by mouth 2 (two) times daily. 180 tablet 1  . gabapentin (NEURONTIN) 300 MG capsule TAKE 1 CAPSULE BY MOUTH IN THE MORNING AND 2 CAPSULES AT NIGHT 270 capsule 0  . levothyroxine (SYNTHROID) 75 MCG tablet Take 1 tablet (75 mcg total) by mouth daily. 90 tablet 3  . linagliptin (TRADJENTA) 5 MG TABS tablet Take 1 tablet (5 mg total) by mouth daily. 90 tablet 3  . lisinopril (ZESTRIL) 40 MG tablet Take 1 tablet (40 mg total) by mouth daily. 90 tablet 3  . meloxicam (MOBIC) 7.5 MG tablet Take 1 tablet (7.5 mg total) by mouth daily as needed for pain (back pain). 90  tablet 1  . metFORMIN (GLUCOPHAGE-XR) 500 MG 24 hr tablet TAKE 1 TABLET BY MOUTH EVERY DAY WITH BREAKFAST 30 tablet 4  . pravastatin (PRAVACHOL) 40 MG tablet Take 1 tablet (40 mg total) by mouth every 3 (three) days. M-W-F 90 tablet 1  . prochlorperazine (COMPAZINE) 10 MG tablet Take 1 tablet (10 mg total) by mouth every 6 (six) hours as needed for nausea or vomiting. 60 tablet 2  . tamsulosin (FLOMAX) 0.4 MG CAPS capsule TAKE 2 CAPSULES BY MOUTH EVERY DAY 180 capsule 0  . umeclidinium bromide (INCRUSE ELLIPTA) 62.5 MCG/INH AEPB Inhale 1 puff into the lungs daily. 90 each 1  . zolpidem (AMBIEN) 5 MG tablet TAKE 1 TABLET (5 MG TOTAL) BY MOUTH AT BEDTIME AS NEEDED FOR SLEEP. 30 tablet 0   No facility-administered medications prior to visit.     Allergies:   Patient has no known allergies.   Social History   Socioeconomic History  . Marital status: Divorced    Spouse name: Not on file  . Number of children: 5  . Years of education: 10  . Highest education level: 10th grade  Occupational History  . Occupation: Retired  Tobacco Use  . Smoking status: Current Every Day Smoker    Packs/day: 0.10    Years: 55.00    Pack years: 5.50    Types: Cigarettes    Start date: 03/11/1961  . Smokeless tobacco: Never Used  . Tobacco comment: smoking 1 to 2 cigarettes per day  Vaping Use  . Vaping Use: Never used  Substance and Sexual Activity  . Alcohol use: No  . Drug use: No  . Sexual activity: Yes    Birth control/protection: None  Other Topics Concern  . Not on file  Social History Narrative  . Not on file   Social Determinants of Health   Financial Resource Strain: Low Risk   . Difficulty of Paying Living Expenses: Not hard at all  Food Insecurity: No Food Insecurity  . Worried About Charity fundraiser in the Last Year: Never true  . Ran Out of Food in the Last Year: Never true  Transportation Needs: No Transportation Needs  . Lack of Transportation (Medical): No  . Lack of  Transportation (Non-Medical): No  Physical Activity: Inactive  . Days of Exercise per Week: 0 days  . Minutes of Exercise per Session: 0 min  Stress: No Stress Concern Present  . Feeling of Stress : Not at all  Social Connections: Moderately Isolated  . Frequency of Communication with Friends and Family: More than three times a week  . Frequency of Social Gatherings with Friends and Family: More than three times a week  . Attends Religious Services: Never  . Active Member of Clubs or Organizations:  No  . Attends Club or Organization Meetings: Never  . Marital Status: Living with partner     Family History:  The patient's ***family history includes Diabetes in his father; Heart disease in his father; Hypertension in his mother and sister; Stroke in his father.   Review of Systems:   Please see the history of present illness.     General:  No chills, fever, night sweats or weight changes.  Cardiovascular:  No chest pain, dyspnea on exertion, edema, orthopnea, palpitations, paroxysmal nocturnal dyspnea. Dermatological: No rash, lesions/masses Respiratory: No cough, dyspnea Urologic: No hematuria, dysuria Abdominal:   No nausea, vomiting, diarrhea, bright red blood per rectum, melena, or hematemesis Neurologic:  No visual changes, wkns, changes in mental status. All other systems reviewed and are otherwise negative except as noted above.   Physical Exam:    VS:  There were no vitals taken for this visit.   General: Well developed, well nourished,male appearing in no acute distress. Head: Normocephalic, atraumatic, sclera non-icteric.  Neck: No carotid bruits. JVD not elevated.  Lungs: Respirations regular and unlabored, without wheezes or rales.  Heart: ***Regular rate and rhythm. No S3 or S4.  No murmur, no rubs, or gallops appreciated. Abdomen: Soft, non-tender, non-distended. No obvious abdominal masses. Msk:  Strength and tone appear normal for age. No obvious joint deformities  or effusions. Extremities: No clubbing or cyanosis. No edema.  Distal pedal pulses are 2+ bilaterally. Neuro: Alert and oriented X 3. Moves all extremities spontaneously. No focal deficits noted. Psych:  Responds to questions appropriately with a normal affect. Skin: No rashes or lesions noted  Wt Readings from Last 3 Encounters:  09/13/19 183 lb 6.4 oz (83.2 kg)  07/05/19 181 lb 12.8 oz (82.5 kg)  05/25/19 181 lb 3.5 oz (82.2 kg)        Studies/Labs Reviewed:   EKG:  EKG is*** ordered today.  The ekg ordered today demonstrates ***  Recent Labs: 06/28/2019: Hemoglobin 15.3; Platelets 302; TSH 1.066 09/13/2019: ALT 13; BUN 11; Creatinine, Ser 0.85; Potassium 4.0; Sodium 137   Lipid Panel    Component Value Date/Time   CHOL 136 04/25/2019 1608   TRIG 175 (H) 04/25/2019 1608   HDL 28 (L) 04/25/2019 1608   CHOLHDL 4.9 04/25/2019 1608   LDLCALC 78 04/25/2019 1608    Additional studies/ records that were reviewed today include:   Echocardiogram: 01/2018 Study Conclusions   - Left ventricle: The cavity size was normal. Wall thickness was  normal. Systolic function was normal. The estimated ejection  fraction was in the range of 60% to 65%. Although no diagnostic  regional wall motion abnormality was identified, this possibility  cannot be completely excluded on the basis of this study.  Indeterminate diastolic function.  - Aortic valve: Mildly calcified annulus. Probably trileaflet;  moderately calcified leaflets.  - Mitral valve: Moderately calcified annulus. There was trivial  regurgitation.  - Right ventricle: Systolic function was mildly reduced.  - Right atrium: Central venous pressure (est): 8 mm Hg.  - Tricuspid valve: There was trivial regurgitation.  - Pulmonary arteries: Systolic pressure could not be accurately  estimated.  - Pericardium, extracardiac: A prominent pericardial fat pad was  present. Cannot exclude small anteroapical pericardial  effusion  with signs of chronicity.   NST: 03/2018  No diagnostic ST segment changes to indicate ischemia.  Medium sized, severe intensity, inferior defect extending from apex to base with inferior septal defect at the base. These regions are fixed and suggestive of  scar versus soft tissue attenuation.  This is an intermediate risk study.  Nuclear stress EF: 48%. Visually, LVEF appears normal, consider echocardiogram if not done recently.  Assessment:    No diagnosis found.   Plan:   In order of problems listed above:  1. ***    Medication Adjustments/Labs and Tests Ordered: Current medicines are reviewed at length with the patient today.  Concerns regarding medicines are outlined above.  Medication changes, Labs and Tests ordered today are listed in the Patient Instructions below. There are no Patient Instructions on file for this visit.   Signed, Erma Heritage, PA-C  09/21/2019 9:30 AM    [facility] [address] [place], [place] [address] Phone: [phone] Fax: [phone]

## 2019-09-21 NOTE — Progress Notes (Signed)
Cardiology Office Note    Date:  09/21/2019   ID:  John [name], DOB 18-Jan-1946, MRN [id number]  PCP:  John Balloon, FNP  Cardiologist: John Sable, MD    Chief Complaint  Patient presents with  . Follow-up    History of Present Illness:    John [name] is a 74 y.o. male with past medical history of CAD (coronary calcifications by prior CT with NST in 2019 showing scar versus soft tissue with no ischemia), Type 2 DM, HTN, HLD, NSCLC, COPD, OSA (on CPAP) and tobacco use who presents to the office today for 56-month follow-up.   He was last examined by Dr. Bronson [name] in 11/2018 and reported having dyspnea on exertion when working outside in the warm temperatures but denied any chest pain or palpitations. Symptoms were overall felt to be secondary to COPD and given that he had been intolerant to Lopressor and Imdur in the past, Pulmonology follow-up was recommended with consideration of a catheterization in the future if symptoms were not felt to be of a pulmonary etiology.   In talking with the patient today, he denies any recent chest pain or palpitations. Has a history of sinus tachycardia but has overall been asymptomatic and did not tolerate BB therapy in the past due to significant fatigue. He has baseline dyspnea on exertion in the setting of COPD. Never evaluated by Pulmonology per his report. He uses a CPAP at night. No recent orthopnea, PND or edema.   He does describe pain along his right leg which typically occurs with activity and improves with rest. He has to stop when walking down his driveway to rest due to the pain. No symptoms at rest.  Past Medical History:  Diagnosis Date  . Adenocarcinoma of lung, right ([facility]) 04/18/2016  . Anxiety   . Arthritis   . Asthma   . CHF (congestive heart failure) ([place])   . COPD (chronic obstructive pulmonary disease) ([place])   . Depression   . Diabetes mellitus without complication ([facility])    no meds  . DM (diabetes mellitus)  ([place]) 07/09/2016  . Dyspnea   . History of kidney stones   . Hyperlipidemia   . Hypertension   . Macular degeneration   . Neuropathy   . Non-small cell lung cancer, right ([place]) 04/18/2016  . Prostatitis   . Pulmonary nodule, left 07/16/2016  . Sleep apnea    cpap    Past Surgical History:  Procedure Laterality Date  . CATARACT EXTRACTION, BILATERAL Bilateral   . NO PAST SURGERIES    . PORTACATH PLACEMENT Left 06/13/2016   Procedure: INSERTION PORT-A-CATH;  Surgeon: John Signs, MD;  Location: [facility];  Service: General;  Laterality: Left;  Marland Kitchen VIDEO BRONCHOSCOPY WITH ENDOBRONCHIAL NAVIGATION N/A 05/28/2016   Procedure: VIDEO BRONCHOSCOPY WITH ENDOBRONCHIAL NAVIGATION;  Surgeon: John Nakayama, MD;  Location: [facility];  Service: Thoracic;  Laterality: N/A;  . VIDEO BRONCHOSCOPY WITH ENDOBRONCHIAL ULTRASOUND N/A 05/28/2016   Procedure: VIDEO BRONCHOSCOPY WITH ENDOBRONCHIAL ULTRASOUND;  Surgeon: John Nakayama, MD;  Location: [facility];  Service: Thoracic;  Laterality: N/A;    Current Medications: Outpatient Medications Prior to Visit  Medication Sig Dispense Refill  . albuterol (VENTOLIN HFA) 108 (90 Base) MCG/ACT inhaler USE 2 PUFF BY MOUTH EVERY 6 HOURS AS NEEDED FOR SHORTNESS OF BREATH OR WHEEZING 18 g 3  . amLODipine (NORVASC) 5 MG tablet Take 1 tablet (5 mg total) by mouth daily. 90 tablet 3  . aspirin EC 81 MG tablet  Take 81 mg by mouth daily.    Marland Kitchen BREO ELLIPTA 200-25 MCG/INH AEPB TAKE 1 PUFF BY MOUTH EVERY DAY 180 each 0  . cloNIDine (CATAPRES) 0.1 MG tablet TAKE 1 TABLET (0.1 MG TOTAL) BY MOUTH 3 (THREE) TIMES DAILY. 270 tablet 1  . dutasteride (AVODART) 0.5 MG capsule TAKE 1 CAPSULE BY MOUTH EVERY DAY 90 capsule 1  . fluticasone (FLONASE) 50 MCG/ACT nasal spray Place 2 sprays into both nostrils daily. 16 g 6  . furosemide (LASIX) 20 MG tablet Take 1 tablet (20 mg total) by mouth 2 (two) times daily. 180 tablet 1  . gabapentin (NEURONTIN) 300 MG capsule TAKE 1 CAPSULE BY MOUTH  IN THE MORNING AND 2 CAPSULES AT NIGHT 270 capsule 0  . levothyroxine (SYNTHROID) 75 MCG tablet Take 1 tablet (75 mcg total) by mouth daily. 90 tablet 3  . linagliptin (TRADJENTA) 5 MG TABS tablet Take 1 tablet (5 mg total) by mouth daily. 90 tablet 3  . lisinopril (ZESTRIL) 40 MG tablet Take 1 tablet (40 mg total) by mouth daily. 90 tablet 3  . meloxicam (MOBIC) 7.5 MG tablet Take 1 tablet (7.5 mg total) by mouth daily as needed for pain (back pain). 90 tablet 1  . metFORMIN (GLUCOPHAGE-XR) 500 MG 24 hr tablet TAKE 1 TABLET BY MOUTH EVERY DAY WITH BREAKFAST 30 tablet 4  . pravastatin (PRAVACHOL) 40 MG tablet Take 1 tablet (40 mg total) by mouth every 3 (three) days. M-W-F 90 tablet 1  . prochlorperazine (COMPAZINE) 10 MG tablet Take 1 tablet (10 mg total) by mouth every 6 (six) hours as needed for nausea or vomiting. 60 tablet 2  . tamsulosin (FLOMAX) 0.4 MG CAPS capsule TAKE 2 CAPSULES BY MOUTH EVERY DAY 180 capsule 0  . umeclidinium bromide (INCRUSE ELLIPTA) 62.5 MCG/INH AEPB Inhale 1 puff into the lungs daily. 90 each 1  . zolpidem (AMBIEN) 5 MG tablet TAKE 1 TABLET (5 MG TOTAL) BY MOUTH AT BEDTIME AS NEEDED FOR SLEEP. 30 tablet 0  . empagliflozin (JARDIANCE) 10 MG TABS tablet Take 1 tablet (10 mg total) by mouth daily before breakfast. (Patient not taking: Reported on 09/21/2019) 30 tablet 3   No facility-administered medications prior to visit.     Allergies:   Lopressor [metoprolol]   Social History   Socioeconomic History  . Marital status: Divorced    Spouse name: Not on file  . Number of children: 5  . Years of education: 10  . Highest education level: 10th grade  Occupational History  . Occupation: Retired  Tobacco Use  . Smoking status: Current Every Day Smoker    Packs/day: 0.10    Years: 55.00    Pack years: 5.50    Types: Cigarettes    Start date: 03/11/1961  . Smokeless tobacco: Never Used  . Tobacco comment: smoking 1 to 2 cigarettes per day  Vaping Use  . Vaping  Use: Never used  Substance and Sexual Activity  . Alcohol use: No  . Drug use: No  . Sexual activity: Yes    Birth control/protection: None  Other Topics Concern  . Not on file  Social History Narrative  . Not on file   Social Determinants of Health   Financial Resource Strain: Low Risk   . Difficulty of Paying Living Expenses: Not hard at all  Food Insecurity: No Food Insecurity  . Worried About Charity fundraiser in the Last Year: Never true  . Ran Out of Food in the Last Year:  Never true  Transportation Needs: No Transportation Needs  . Lack of Transportation (Medical): No  . Lack of Transportation (Non-Medical): No  Physical Activity: Inactive  . Days of Exercise per Week: 0 days  . Minutes of Exercise per Session: 0 min  Stress: No Stress Concern Present  . Feeling of Stress : Not at all  Social Connections: Moderately Isolated  . Frequency of Communication with Friends and Family: More than three times a week  . Frequency of Social Gatherings with Friends and Family: More than three times a week  . Attends Religious Services: Never  . Active Member of Clubs or Organizations: No  . Attends Archivist Meetings: Never  . Marital Status: Living with partner     Family History:  The patient's family history includes Diabetes in his father; Heart disease in his father; Hypertension in his mother and sister; Stroke in his father.   Review of Systems:   Please see the history of present illness.     General:  No chills, fever, night sweats or weight changes.  Cardiovascular:  No chest pain, edema, orthopnea, palpitations, paroxysmal nocturnal dyspnea. Positive for dyspnea on exertion and claudication.  Dermatological: No rash, lesions/masses Respiratory: No cough, dyspnea Urologic: No hematuria, dysuria Abdominal:   No nausea, vomiting, diarrhea, bright red blood per rectum, melena, or hematemesis Neurologic:  No visual changes, wkns, changes in mental  status. All other systems reviewed and are otherwise negative except as noted above.   Physical Exam:    VS:  BP 138/66   Pulse (!) 102   Ht 5\' 6"  (1.676 m)   Wt 182 lb (82.6 kg)   SpO2 94%   BMI 29.38 kg/m    General: Well developed, well nourished,male appearing in no acute distress. Head: Normocephalic, atraumatic, sclera non-icteric.  Neck: No carotid bruits. JVD not elevated.  Lungs: Respirations regular and unlabored, without wheezes or rales.  Heart: Regular rate and rhythm. No S3 or S4.  No murmur, no rubs, or gallops appreciated. Abdomen: Soft, non-tender, non-distended. No obvious abdominal masses. Msk:  Strength and tone appear normal for age. No obvious joint deformities or effusions. Extremities: No clubbing or cyanosis. No lower extremity edema.  Distal pedal pulses are 1+ bilaterally. Neuro: Alert and oriented X 3. Moves all extremities spontaneously. No focal deficits noted. Psych:  Responds to questions appropriately with a normal affect. Skin: No rashes or lesions noted  Wt Readings from Last 3 Encounters:  09/21/19 182 lb (82.6 kg)  09/13/19 183 lb 6.4 oz (83.2 kg)  07/05/19 181 lb 12.8 oz (82.5 kg)      Studies/Labs Reviewed:   EKG:  EKG is ordered today.  The ekg ordered today demonstrates sinus tachycardia, HR 103 with 1st degree AV block. No acute ST changes when compared to prior tracings.   Recent Labs: 06/28/2019: Hemoglobin 15.3; Platelets 302; TSH 1.066 09/13/2019: ALT 13; BUN 11; Creatinine, Ser 0.85; Potassium 4.0; Sodium 137   Lipid Panel    Component Value Date/Time   CHOL 136 04/25/2019 1608   TRIG 175 (H) 04/25/2019 1608   HDL 28 (L) 04/25/2019 1608   CHOLHDL 4.9 04/25/2019 1608   LDLCALC 78 04/25/2019 1608    Additional studies/ records that were reviewed today include:   Echocardiogram: 01/2018 Study Conclusions   - Left ventricle: The cavity size was normal. Wall thickness was  normal. Systolic function was normal. The  estimated ejection  fraction was in the range of 60% to 65%. Although  no diagnostic  regional wall motion abnormality was identified, this possibility  cannot be completely excluded on the basis of this study.  Indeterminate diastolic function.  - Aortic valve: Mildly calcified annulus. Probably trileaflet;  moderately calcified leaflets.  - Mitral valve: Moderately calcified annulus. There was trivial  regurgitation.  - Right ventricle: Systolic function was mildly reduced.  - Right atrium: Central venous pressure (est): 8 mm Hg.  - Tricuspid valve: There was trivial regurgitation.  - Pulmonary arteries: Systolic pressure could not be accurately  estimated.  - Pericardium, extracardiac: A prominent pericardial fat pad was  present. Cannot exclude small anteroapical pericardial effusion  with [name] of chronicity.   NST: 03/2018  No diagnostic ST segment changes to indicate ischemia.  Medium sized, severe intensity, inferior defect extending from apex to base with inferior septal defect at the base. These regions are fixed and suggestive of scar versus soft tissue attenuation.  This is an intermediate risk study.  Nuclear stress EF: 48%. Visually, LVEF appears normal, consider echocardiogram if not done recently.  Assessment:    1. Coronary artery calcification seen on CT scan   2. Essential hypertension   3. Claudication in peripheral vascular disease ([place])   4. Chronic obstructive pulmonary disease, unspecified COPD type ([place])      Plan:   In order of problems listed above:  1. CAD - he had coronary calcifications by prior CT and NST in 2019 showed scar versus soft tissue with no ischemia as outlined above.  - he has baseline dyspnea on exertion in the setting of COPD but denies any acute changes in this. No recent chest pain.  - continue with risk factor modification. He remains on ASA 81mg  daily and Pravastatin 40mg  daily. Previously intolerant to BB  therapy.   2. HTN - BP is at 138/66 during today's visit. Continue current regimen with Amlodipine 5mg  daily, Clonidine 0.1mg  TID and Lisinopril 40mg  daily.   3. Pain Along Lower Extremities concerning for Claudication - he experiences pain along his right leg with walking which improves at rest. He has multiple risk factors for PAD including HTN, HLD, Type 2 DM, Coronary Calcifications and continued tobacco use. Will plan to obtain lower extremity ABI's and doppler studies for further evaluation.   4. COPD - currently followed by his PCP and he has never been evaluated by Pulmonology per his report. Will refer to [facility] here in [place] for further evaluation and to establish care.     Medication Adjustments/Labs and Tests Ordered: Current medicines are reviewed at length with the patient today.  Concerns regarding medicines are outlined above.  Medication changes, Labs and Tests ordered today are listed in the Patient Instructions below. Patient Instructions   Medication Instructions:  Your physician recommends that you continue on your current medications as directed. Please refer to the Current Medication list given to you today.  *If you need a refill on your cardiac medications before your next appointment, please call your pharmacy*   Lab Work: NONE   If you have labs (blood work) drawn today and your tests are completely normal, you will receive your results only by: Marland Kitchen MyChart Message (if you have MyChart) OR . A paper copy in the mail If you have any lab test that is abnormal or we need to change your treatment, we will call you to review the results.   Testing/Procedures: Your physician has requested that you have an ankle brachial index (ABI). During this test an ultrasound and  blood pressure cuff are used to evaluate the arteries that supply the arms and legs with blood. Allow thirty minutes for this exam. There are no restrictions or special  instructions.     Follow-Up: At [facility], you and your health needs are our priority.  As part of our continuing mission to provide you with exceptional heart care, we have created designated Provider Care Teams.  These Care Teams include your primary Cardiologist (physician) and Advanced Practice Providers (APPs -  Physician Assistants and Nurse Practitioners) who all work together to provide you with the care you need, when you need it.  We recommend signing up for the patient portal called "MyChart".  Sign up information is provided on this After Visit Summary.  MyChart is used to connect with patients for Virtual Visits (Telemedicine).  Patients are able to view lab/test results, encounter notes, upcoming appointments, etc.  Non-urgent messages can be sent to your provider as well.   To learn more about what you can do with MyChart, go to [email or web address].    Your next appointment:   6 month(s)  The format for your next appointment:   In Person  Provider:   [name], MD   Other Instructions  Signed, Erma Heritage, PA-C  09/21/2019 7:40 PM    [facility]. [address] [place], [place] [address] Phone: [phone] Fax: [phone]

## 2019-09-21 NOTE — Patient Instructions (Addendum)
Medication Instructions:  Your physician recommends that you continue on your current medications as directed. Please refer to the Current Medication list given to you today.  *If you need a refill on your cardiac medications before your next appointment, please call your pharmacy*   Lab Work: NONE   If you have labs (blood work) drawn today and your tests are completely normal, you will receive your results only by: Marland Kitchen MyChart Message (if you have MyChart) OR . A paper copy in the mail If you have any lab test that is abnormal or we need to change your treatment, we will call you to review the results.   Testing/Procedures: Your physician has requested that you have an ankle brachial index (ABI). During this test an ultrasound and blood pressure cuff are used to evaluate the arteries that supply the arms and legs with blood. Allow thirty minutes for this exam. There are no restrictions or special instructions.     Follow-Up: At [facility], you and your health needs are our priority.  As part of our continuing mission to provide you with exceptional heart care, we have created designated Provider Care Teams.  These Care Teams include your primary Cardiologist (physician) and Advanced Practice Providers (APPs -  Physician Assistants and Nurse Practitioners) who all work together to provide you with the care you need, when you need it.  We recommend signing up for the patient portal called "MyChart".  Sign up information is provided on this After Visit Summary.  MyChart is used to connect with patients for Virtual Visits (Telemedicine).  Patients are able to view lab/test results, encounter notes, upcoming appointments, etc.  Non-urgent messages can be sent to your provider as well.   To learn more about what you can do with MyChart, go to [email or web address].    Your next appointment:   6 month(s)  The format for your next appointment:   In Person  Provider:   [name], MD   Other Instructions  Two Gram Sodium Diet 2000 mg  What is Sodium? Sodium is a mineral found naturally in many foods. The most significant source of sodium in the diet is table salt, which is about 40% sodium.  Processed, convenience, and preserved foods also contain a large amount of sodium.  The body needs only 500 mg of sodium daily to function,  A normal diet provides more than enough sodium even if you do not use salt.  Why Limit Sodium? A build up of sodium in the body can cause thirst, increased blood pressure, shortness of breath, and water retention.  Decreasing sodium in the diet can reduce edema and risk of heart attack or stroke associated with high blood pressure.  Keep in mind that there are many other factors involved in these health problems.  Heredity, obesity, lack of exercise, cigarette smoking, stress and what you eat all play a role.  General Guidelines:  Do not add salt at the table or in cooking.  One teaspoon of salt contains over 2 grams of sodium.  Read food labels  Avoid processed and convenience foods  Ask your dietitian before eating any foods not dicussed in the menu planning guidelines  Consult your physician if you wish to use a salt substitute or a sodium containing medication such as antacids.  Limit milk and milk products to 16 oz (2 cups) per day.  Shopping Hints:  READ LABELS!! "Dietetic" does not necessarily mean low sodium.  Salt and other sodium ingredients are  often added to foods during processing.   Menu Planning Guidelines Food Group Choose More Often Avoid  Beverages (see also the milk group All fruit juices, low-sodium, salt-free vegetables juices, low-sodium carbonated beverages Regular vegetable or tomato juices, commercially softened water used for drinking or cooking  Breads and Cereals Enriched white, wheat, rye and pumpernickel bread, hard rolls and dinner rolls; muffins, cornbread and waffles; most dry cereals,  cooked cereal without added salt; unsalted crackers and breadsticks; low sodium or homemade bread crumbs Bread, rolls and crackers with salted tops; quick breads; instant hot cereals; pancakes; commercial bread stuffing; self-rising flower and biscuit mixes; regular bread crumbs or cracker crumbs  Desserts and Sweets Desserts and sweets mad with mild should be within allowance Instant pudding mixes and cake mixes  Fats Butter or margarine; vegetable oils; unsalted salad dressings, regular salad dressings limited to 1 Tbs; light, sour and heavy cream Regular salad dressings containing bacon fat, bacon bits, and salt pork; snack dips made with instant soup mixes or processed cheese; salted nuts  Fruits Most fresh, frozen and canned fruits Fruits processed with salt or sodium-containing ingredient (some dried fruits are processed with sodium sulfites        Vegetables Fresh, frozen vegetables and low- sodium canned vegetables Regular canned vegetables, sauerkraut, pickled vegetables, and others prepared in brine; frozen vegetables in sauces; vegetables seasoned with ham, bacon or salt pork  Condiments, Sauces, Miscellaneous  Salt substitute with physician's approval; pepper, herbs, spices; vinegar, lemon or lime juice; hot pepper sauce; garlic powder, onion powder, low sodium soy sauce (1 Tbs.); low sodium condiments (ketchup, chili sauce, mustard) in limited amounts (1 tsp.) fresh ground horseradish; unsalted tortilla chips, pretzels, potato chips, popcorn, salsa (1/4 cup) Any seasoning made with salt including garlic salt, celery salt, onion salt, and seasoned salt; sea salt, rock salt, kosher salt; meat tenderizers; monosodium glutamate; mustard, regular soy sauce, barbecue, sauce, chili sauce, teriyaki sauce, steak sauce, Worcestershire sauce, and most flavored vinegars; canned gravy and mixes; regular condiments; salted snack foods, olives, picles, relish, horseradish sauce, catsup   Food  preparation: Try these seasonings Meats:    Pork Sage, onion Serve with applesauce  Chicken Poultry seasoning, thyme, parsley Serve with cranberry sauce  Lamb Curry powder, rosemary, garlic, thyme Serve with mint sauce or jelly  Veal Marjoram, basil Serve with current jelly, cranberry sauce  Beef Pepper, bay leaf Serve with dry mustard, unsalted chive butter  Fish [place] leaf, dill Serve with unsalted lemon butter, unsalted parsley butter  Vegetables:    Asparagus Lemon juice   Broccoli Lemon juice   Carrots Mustard dressing parsley, mint, nutmeg, glazed with unsalted butter and sugar   Green beans Marjoram, lemon juice, nutmeg,dill seed   Tomatoes Basil, marjoram, onion   Spice /blend for [employer]" 4 tsp ground thyme 1 tsp ground sage 3 tsp ground rosemary 4 tsp ground marjoram   Test your knowledge 1. A product that says "Salt Free" may still contain sodium. True or False 2. Garlic Powder and Hot Pepper Sauce an be used as alternative seasonings.True or False 3. Processed foods have more sodium than fresh foods.  True or False 4. Canned Vegetables have less sodium than froze True or False  WAYS TO DECREASE YOUR SODIUM INTAKE 1. Avoid the use of added salt in cooking and at the table.  Table salt (and other prepared seasonings which contain salt) is probably one of the greatest sources of sodium in the diet.  Unsalted foods can gain  flavor from the sweet, sour, and butter taste sensations of herbs and spices.  Instead of using salt for seasoning, try the following seasonings with the foods listed.  Remember: how you use them to enhance natural food flavors is limited only by your creativity... Allspice-Meat, fish, eggs, fruit, peas, red and yellow vegetables Almond Extract-Fruit baked goods Anise Seed-Sweet breads, fruit, carrots, beets, cottage cheese, cookies (tastes like licorice) Basil-Meat, fish, eggs, vegetables, rice, vegetables salads, soups, sauces Bay Leaf-Meat, fish, stews,  poultry Burnet-Salad, vegetables (cucumber-like flavor) Caraway Seed-Bread, cookies, cottage cheese, meat, vegetables, cheese, rice Cardamon-Baked goods, fruit, soups Celery Powder or seed-Salads, salad dressings, sauces, meatloaf, soup, bread.Do not use  celery salt Chervil-Meats, salads, fish, eggs, vegetables, cottage cheese (parsley-like flavor) Chili Power-Meatloaf, chicken cheese, corn, eggplant, egg dishes Chives-Salads cottage cheese, egg dishes, soups, vegetables, sauces Cilantro-Salsa, casseroles Cinnamon-Baked goods, fruit, pork, lamb, chicken, carrots Cloves-Fruit, baked goods, fish, pot roast, green beans, beets, carrots Coriander-Pastry, cookies, meat, salads, cheese (lemon-orange flavor) Cumin-Meatloaf, fish,cheese, eggs, cabbage,fruit pie (caraway flavor) [employer], fruit, eggs, fish, poultry, cottage cheese, vegetables Dill Seed-Meat, cottage cheese, poultry, vegetables, fish, salads, bread Fennel Seed-Bread, cookies, apples, pork, eggs, fish, beets, cabbage, cheese, Licorice-like flavor Garlic-(buds or powder) Salads, meat, poultry, fish, bread, butter, vegetables, potatoes.Do not  use garlic salt Ginger-Fruit, vegetables, baked goods, meat, fish, poultry Horseradish Root-Meet, vegetables, butter Lemon Juice or Extract-Vegetables, fruit, tea, baked goods, fish salads Mace-Baked goods fruit, vegetables, fish, poultry (taste like nutmeg) Maple Extract-Syrups Marjoram-Meat, chicken, fish, vegetables, breads, green salads (taste like Sage) Mint-Tea, lamb, sherbet, vegetables, desserts, carrots, cabbage Mustard, Dry or Seed-Cheese, eggs, meats, vegetables, poultry Nutmeg-Baked goods, fruit, chicken, eggs, vegetables, desserts Onion Powder-Meat, fish, poultry, vegetables, cheese, eggs, bread, rice salads (Do not use   Onion salt) Orange Extract-Desserts, baked goods Oregano-Pasta, eggs, cheese, onions, pork, lamb, fish, chicken, vegetables, green salads Paprika-Meat,  fish, poultry, eggs, cheese, vegetables Parsley Flakes-Butter, vegetables, meat fish, poultry, eggs, bread, salads (certain forms may   Contain sodium Pepper-Meat fish, poultry, vegetables, eggs Peppermint Extract-Desserts, baked goods Poppy Seed-Eggs, bread, cheese, fruit dressings, baked goods, noodles, vegetables, cottage  [employer], poultry, meat, fish, cauliflower, turnips,eggs bread Saffron-Rice, bread, veal, chicken, fish, eggs Sage-Meat, fish, poultry, onions, eggplant, tomateos, pork, stews Savory-Eggs, salads, poultry, meat, rice, vegetables, soups, pork Tarragon-Meat, poultry, fish, eggs, butter, vegetables (licorice-like flavor)  Thyme-Meat, poultry, fish, eggs, vegetables, (clover-like flavor), sauces, soups Tumeric-Salads, butter, eggs, fish, rice, vegetables (saffron-like flavor) Vanilla Extract-Baked goods, candy Vinegar-Salads, vegetables, meat marinades Walnut Extract-baked goods, candy  2. Choose your Foods Wisely   The following is a list of foods to avoid which are high in sodium:  Meats-Avoid all smoked, canned, salt cured, dried and kosher meat and fish as well as Anchovies   [employer] meats:Bologna, Liverwurst, Pastrami Canned meat or fish  Marinated herring Caviar    Pepperoni Corned Beef   Pizza Dried chipped beef  Salami Frozen breaded fish or meat Salt pork Frankfurters or hot dogs  Sardines Gefilte fish   Sausage Ham (boiled ham, Proscuitto Smoked butt    spiced ham)   Spam      TV Dinners Vegetables Canned vegetables (Regular) Relish Canned mushrooms  Sauerkraut Olives    Tomato juice Pickles  Bakery and Dessert Products Canned puddings  Cream pies Cheesecake   Decorated cakes Cookies  Beverages/Juices Tomato juice, regular  Gatorade   V-8 vegetable juice, regular  Breads and Cereals Biscuit mixes   Salted potato chips, corn chips,  pretzels Bread stuffing mixes  Salted crackers and  rolls Pancake and waffle mixes Self-rising flour  Seasonings Accent    Meat sauces Barbecue sauce  Meat tenderizer Catsup    Monosodium glutamate (MSG) Celery salt   Onion salt Chili sauce   Prepared mustard Garlic salt   Salt, seasoned salt, sea salt Gravy mixes   Soy sauce Horseradish   Steak sauce Ketchup   Tartar sauce Lite salt    Teriyaki sauce Marinade mixes   Worcestershire sauce  Others Baking powder   Cocoa and cocoa mixes Baking soda   Commercial casserole mixes Candy-caramels, chocolate  Dehydrated soups    Bars, fudge,nougats  Instant rice and pasta mixes Canned broth or soup  Maraschino cherries Cheese, aged and processed cheese and cheese spreads

## 2019-09-23 ENCOUNTER — Other Ambulatory Visit: Payer: Self-pay

## 2019-09-23 ENCOUNTER — Ambulatory Visit (INDEPENDENT_AMBULATORY_CARE_PROVIDER_SITE_OTHER): Payer: Medicare Other | Admitting: Pharmacist

## 2019-09-23 DIAGNOSIS — E1142 Type 2 diabetes mellitus with diabetic polyneuropathy: Secondary | ICD-10-CM

## 2019-09-23 MED ORDER — RYBELSUS 7 MG PO TABS: 7.0000 mg | ORAL_TABLET | ORAL | 4 refills | Status: DC

## 2019-09-23 NOTE — Progress Notes (Signed)
    09/23/2019 Name: John [name] MRN: [id number] DOB: November 03, 1945   S:  46 yoM presents for diabetes evaluation, education, and management Patient was referred and last seen by Primary Care Provider on 09/13/19.  Insurance coverage/medication affordability: [facility] [employer] [facility]  Patient reports adherence with medications. . Current diabetes medications include: tradjenta, metformin . Current hypertension medications include: amlodipine, lisinopril Goal 130/80 . Current hyperlipidemia medications include: pravastatin  Patient denies hypoglycemic events.   Patient reported dietary habits: Eats 2-3 meals/day Discussed meal planning options and Plate method for health eating Avoid sugary drinks and desserts Incorporate balanced protein, non starchy veggies, 1 serving of carbohydrate Increase water intake Increase physical activity as able  Patient-reported exercise habits: n/a O:  Lab Results  Component Value Date   HGBA1C 9.1 (H) 09/13/2019   Lipid Panel     Component Value Date/Time   CHOL 136 04/25/2019 1608   TRIG 175 (H) 04/25/2019 1608   HDL 28 (L) 04/25/2019 1608   CHOLHDL 4.9 04/25/2019 1608   LDLCALC 78 04/25/2019 1608    Home fasting blood sugars: n/a  2 hour post-meal/random blood sugars: n/a.     A/P:  Diabetes T2DM currently uncontrolled. Patient is able to verbalize appropriate hypoglycemia management plan. Patient is adherent with medication. Control is suboptimal due to diet.  -Started GLP-1 RYBLESUS (generic name semaglutide)  Sample given --> LOT#[id number], EXP3/22  -STOP  TRADJENTA  -CONTINUE metformin--will titrate dose at next visit  -Extensively discussed pathophysiology of diabetes, recommended lifestyle interventions, dietary effects on blood sugar control  -Counseled on s/sx of and management of hypoglycemia  -Next A1C anticipated September 2021   Written patient instructions provided.  Total time in face to face counseling 30  minutes.   Follow up Pharmacist Clinic Visit ON 10/14/19.    Regina Eck, PharmD, BCPS Clinical Pharmacist, [place]  II Phone [phone]

## 2019-09-24 ENCOUNTER — Other Ambulatory Visit: Payer: Self-pay | Admitting: Family

## 2019-09-25 ENCOUNTER — Other Ambulatory Visit: Payer: Self-pay | Admitting: Family

## 2019-09-27 ENCOUNTER — Encounter (HOSPITAL_COMMUNITY): Payer: Self-pay

## 2019-09-27 ENCOUNTER — Other Ambulatory Visit: Payer: Self-pay

## 2019-09-27 ENCOUNTER — Inpatient Hospital Stay (HOSPITAL_COMMUNITY): Payer: Medicare Other | Attending: Hematology

## 2019-09-27 DIAGNOSIS — Z85118 Personal history of other malignant neoplasm of bronchus and lung: Secondary | ICD-10-CM | POA: Diagnosis present

## 2019-09-27 DIAGNOSIS — Z452 Encounter for adjustment and management of vascular access device: Secondary | ICD-10-CM | POA: Insufficient documentation

## 2019-09-27 MED ORDER — SODIUM CHLORIDE 0.9% FLUSH
10.0000 mL | INTRAVENOUS | Status: DC | PRN
  Administered 2019-09-27: 10 mL via INTRAVENOUS

## 2019-09-27 MED ORDER — HEPARIN SOD (PORK) LOCK FLUSH 100 UNIT/ML IV SOLN
500.0000 [IU] | Freq: Once | INTRAVENOUS | Status: AC
  Administered 2019-09-27: 500 [IU] via INTRAVENOUS

## 2019-09-27 NOTE — Patient Instructions (Signed)
[place] at [facility] Discharge Instructions  Portacath flushed per protocol today. Follow-up as scheduled   Thank you for choosing [place] at [facility] to provide your oncology and hematology care.  To afford each patient quality time with our provider, please arrive at least 15 minutes before your scheduled appointment time.   If you have a lab appointment with the [facility] please come in thru the Main Entrance and check in at the main information desk.  You need to re-schedule your appointment should you arrive 10 or more minutes late.  We strive to give you quality time with our providers, and arriving late affects you and other patients whose appointments are after yours.  Also, if you no show three or more times for appointments you may be dismissed from the clinic at the providers discretion.     Again, thank you for choosing [facility].  Our hope is that these requests will decrease the amount of time that you wait before being seen by our physicians.       _____________________________________________________________  Should you have questions after your visit to [facility], please contact our office at [phone] between the hours of 8:00 a.m. and 4:30 p.m.  Voicemails left after 4:00 p.m. will not be returned until the following business day.  For prescription refill requests, have your pharmacy contact our office and allow 72 hours.    Due to Covid, you will need to wear a mask upon entering the hospital. If you do not have a mask, a mask will be given to you at the Main Entrance upon arrival. For doctor visits, patients may have 1 support person with them. For treatment visits, patients can not have anyone with them due to social distancing guidelines and our immunocompromised population.

## 2019-09-27 NOTE — Progress Notes (Signed)
[place] tolerated portacath flush well without complaints or incident. Port accessed with 20 gauge needle without blood return noted but flushed easily per protocol without complaints or pain or swelling. VSS Pt discharged self ambulatory using his cane in satisfactory condition

## 2019-09-30 ENCOUNTER — Other Ambulatory Visit: Payer: Self-pay | Admitting: Family

## 2019-10-04 ENCOUNTER — Other Ambulatory Visit: Payer: Self-pay | Admitting: Student

## 2019-10-04 DIAGNOSIS — I739 Peripheral vascular disease, unspecified: Secondary | ICD-10-CM

## 2019-10-05 ENCOUNTER — Other Ambulatory Visit: Payer: Self-pay | Admitting: Family

## 2019-10-07 ENCOUNTER — Other Ambulatory Visit: Payer: Self-pay | Admitting: Family

## 2019-10-07 ENCOUNTER — Other Ambulatory Visit: Payer: Self-pay | Admitting: Pharmacist

## 2019-10-07 NOTE — Telephone Encounter (Signed)
Rx sent to pharmacy and pt is aware.

## 2019-10-07 NOTE — Telephone Encounter (Signed)
  Prescription Request  10/07/2019  What is the name of the medication or equipment? Dutaseride 0.5 Patient has been out for a week and needs ASAP  Have you contacted your pharmacy to request a refill? (if applicable) YES  [facility] [place]   Patient notified that their request is being sent to the clinical staff for review and that they should receive a response within 2 business days.

## 2019-10-11 ENCOUNTER — Telehealth: Payer: Self-pay | Admitting: Pharmacist

## 2019-10-11 NOTE — Telephone Encounter (Signed)
Left VM for patient to call PharmD back Purpose of this call was to check in on new medication (Rybelsus) and also to clarify inhalers for patient (Breo vs Incruse)

## 2019-10-14 ENCOUNTER — Ambulatory Visit (INDEPENDENT_AMBULATORY_CARE_PROVIDER_SITE_OTHER): Payer: Medicare Other | Admitting: Pharmacist

## 2019-10-14 ENCOUNTER — Other Ambulatory Visit: Payer: Self-pay

## 2019-10-14 ENCOUNTER — Encounter: Payer: Self-pay | Admitting: Pharmacist

## 2019-10-14 VITALS — BP 128/74 | HR 67

## 2019-10-14 DIAGNOSIS — I2510 Atherosclerotic heart disease of native coronary artery without angina pectoris: Secondary | ICD-10-CM | POA: Diagnosis not present

## 2019-10-14 DIAGNOSIS — E1142 Type 2 diabetes mellitus with diabetic polyneuropathy: Secondary | ICD-10-CM | POA: Diagnosis not present

## 2019-10-14 MED ORDER — RYBELSUS 7 MG PO TABS: 7.0000 mg | ORAL_TABLET | ORAL | 4 refills | Status: DC

## 2019-10-14 MED ORDER — ONETOUCH VERIO VI STRP: ORAL_STRIP | 12 refills | Status: DC

## 2019-10-14 MED ORDER — ONETOUCH DELICA LANCETS 30G MISC: 1 refills | Status: DC

## 2019-10-14 MED ORDER — ONETOUCH VERIO REFLECT W/DEVICE KIT: PACK | 1 refills | Status: DC

## 2019-10-14 NOTE — Progress Notes (Signed)
° ° °  10/14/2019 Name: John [name] MRN: [id number] DOB: [birth year]   S:  40 yoM presents for diabetes evaluation, education, and management Patient was referred and last seen by Primary Care Provider on 09/13/19.  Insurance coverage/medication affordability: [facility] [employer] medic[employer]  Patient reports adherence with medications.  Current diabetes medications include: Rybelsus, metformin  Current hypertension medications include: amlodipine, lisinopril, clonidine Goal 130/80  Current hyperlipidemia medications include: pravastatin   Patient denies hypoglycemic events.   Patient reported dietary habits: Eats 2-3 meals/day Discussed meal planning options and Plate method for healthy eating  Avoid sugary drinks and desserts  Incorporate balanced protein, non starchy veggies, 1 serving of carbohydrate with each meal  Increase water intake  Increase physical activity as able  Patient-reported exercise habits: n/a  Patient reports nocturia (nighttime urination).  Patient denies neuropathy (nerve pain).  Patient denies visual changes.  Patient denies self foot exams.    O:  Lab Results  Component Value Date   HGBA1C 9.1 (H) 09/13/2019    Vitals:   10/14/19 1339  BP: 128/74  Pulse: 67    Lipid Panel     Component Value Date/Time   CHOL 136 04/25/2019 1608   TRIG 175 (H) 04/25/2019 1608   HDL 28 (L) 04/25/2019 1608   CHOLHDL 4.9 04/25/2019 1608   LDLCALC 78 04/25/2019 1608    Home fasting blood sugars: 160, not checking  2 hour post-meal/random blood sugars: n/a.   A/P:  Diabetes T2DM currently uncontrolled. Patient is able to verbalize appropriate hypoglycemia management plan. Patient is adherent with medication. Patient's BGs have improved since last visit  -Continue GLP-1 RYBLESUS 3mg  (generic name semaglutide) for 1 more month Sample given --> AFB#[id number], EXP3/23  Will increase to 7mg  at next fill  -CONTINUE metformin--will consider titration of  dose at next visit  -One touch verio reflect system [facility] in to [facility]  Instructed patient how to use   Push button lancets were given  -Extensively discussed pathophysiology of diabetes, recommended lifestyle interventions, dietary effects on blood sugar control  -Counseled on s/sx of and management of hypoglycemia  -Next A1C anticipated 12/2019     Written patient instructions provided.  Total time in face to face counseling 30 minutes.   Follow up PCP Clinic Visit ON 12/2019.  Follow up with PharmD on 11/14/19   Regina Eck, PharmD, BCPS Clinical Pharmacist, [place]  II Phone [phone]

## 2019-10-19 ENCOUNTER — Ambulatory Visit: Payer: Medicare Other | Admitting: Student

## 2019-10-19 NOTE — Progress Notes (Deleted)
Cardiology Office Note    Date:  10/19/2019   ID:  John [name], DOB 1945-04-28, MRN [id number]  PCP:  [name], FNP  Cardiologist: [name], MD    No chief complaint on file.   History of Present Illness:    John [name] is a 74 y.o. male with past medical history of CAD (coronary calcifications by prior CT with NST in 2019 showing scar versus soft tissue with no ischemia), Type 2 DM, HTN, HLD, NSCLC, COPD, OSA (on CPAP) and tobacco use who presents to the office today for 1 month follow-up.  He was last examined by myself in 09/2019 and denied any recent chest pain or palpitations.  He did report baseline dyspnea on exertion in the setting of COPD along with pain along his lower extremities when walking which would improve with rest. Lower extremity ABI's and dopplers were recommended for further evaluation.     Past Medical History:  Diagnosis Date  . Adenocarcinoma of lung, right ([place]) 04/18/2016  . Anxiety   . Arthritis   . Asthma   . CHF (congestive heart failure) ([place])   . COPD (chronic obstructive pulmonary disease) ([place])   . Depression   . Diabetes mellitus without complication (HCC)    no meds  . DM (diabetes mellitus) ([place]) 07/09/2016  . Dyspnea   . History of kidney stones   . Hyperlipidemia   . Hypertension   . Macular degeneration   . Neuropathy   . Non-small cell lung cancer, right ([place]) 04/18/2016  . Prostatitis   . Pulmonary nodule, left 07/16/2016  . Sleep apnea    cpap    Past Surgical History:  Procedure Laterality Date  . CATARACT EXTRACTION, BILATERAL Bilateral   . NO PAST SURGERIES    . PORTACATH PLACEMENT Left 06/13/2016   Procedure: INSERTION PORT-A-CATH;  Surgeon: Aviva Signs, MD;  Location: [facility];  Service: General;  Laterality: Left;  Marland Kitchen VIDEO BRONCHOSCOPY WITH ENDOBRONCHIAL NAVIGATION N/A 05/28/2016   Procedure: VIDEO BRONCHOSCOPY WITH ENDOBRONCHIAL NAVIGATION;  Surgeon: [name], MD;  Location: [facility];   Service: Thoracic;  Laterality: N/A;  . VIDEO BRONCHOSCOPY WITH ENDOBRONCHIAL ULTRASOUND N/A 05/28/2016   Procedure: VIDEO BRONCHOSCOPY WITH ENDOBRONCHIAL ULTRASOUND;  Surgeon: [name], MD;  Location: [facility];  Service: Thoracic;  Laterality: N/A;    Current Medications: Outpatient Medications Prior to Visit  Medication Sig Dispense Refill  . albuterol (VENTOLIN HFA) 108 (90 Base) MCG/ACT inhaler USE 2 PUFF BY MOUTH EVERY 6 HOURS AS NEEDED FOR SHORTNESS OF BREATH OR WHEEZING 18 g 3  . amLODipine (NORVASC) 5 MG tablet Take 1 tablet (5 mg total) by mouth daily. 90 tablet 3  . aspirin EC 81 MG tablet Take 81 mg by mouth daily.    . Blood Glucose Monitoring Suppl (ONETOUCH VERIO REFLECT) w/Device KIT Use to test blood sugars daily as directed. DX: E11.9 1 kit 1  . BREO ELLIPTA 200-25 MCG/INH AEPB TAKE 1 PUFF BY MOUTH EVERY DAY 180 each 0  . cloNIDine (CATAPRES) 0.1 MG tablet TAKE 1 TABLET (0.1 MG TOTAL) BY MOUTH 3 (THREE) TIMES DAILY. 270 tablet 1  . dutasteride (AVODART) 0.5 MG capsule TAKE 1 CAPSULE BY MOUTH EVERY DAY 90 capsule 0  . fluticasone (FLONASE) 50 MCG/ACT nasal spray Place 2 sprays into both nostrils daily. 16 g 6  . furosemide (LASIX) 20 MG tablet Take 1 tablet (20 mg total) by mouth 2 (two) times daily. 180 tablet 1  . gabapentin (NEURONTIN)  300 MG capsule TAKE 1 CAPSULE BY MOUTH IN THE MORNING AND 2 CAPSULES AT NIGHT 270 capsule 0  . glucose blood (ONETOUCH VERIO) test strip Use to test blood sugars daily as directed. DX: E11.9 100 each 12  . levothyroxine (SYNTHROID) 75 MCG tablet Take 1 tablet (75 mcg total) by mouth daily. 90 tablet 3  . lisinopril (ZESTRIL) 40 MG tablet Take 1 tablet (40 mg total) by mouth daily. 90 tablet 3  . meloxicam (MOBIC) 7.5 MG tablet Take 1 tablet (7.5 mg total) by mouth daily as needed for pain (back pain). 90 tablet 1  . metFORMIN (GLUCOPHAGE-XR) 500 MG 24 hr tablet TAKE 1 TABLET BY MOUTH EVERY DAY WITH BREAKFAST 30 tablet 4  . OneTouch  Delica Lancets 16X MISC Use to test blood sugars daily as directed. DX: E11.9 100 each 1  . pravastatin (PRAVACHOL) 40 MG tablet Take 1 tablet (40 mg total) by mouth every 3 (three) days. M-W-F 90 tablet 1  . prochlorperazine (COMPAZINE) 10 MG tablet Take 1 tablet (10 mg total) by mouth every 6 (six) hours as needed for nausea or vomiting. 60 tablet 2  . Semaglutide (RYBELSUS) 7 MG TABS Take 7 mg by mouth every morning. 90 tablet 4  . tamsulosin (FLOMAX) 0.4 MG CAPS capsule TAKE 2 CAPSULES BY MOUTH EVERY DAY 180 capsule 0  . temazepam (RESTORIL) 15 MG capsule Take 15 mg by mouth at bedtime as needed for sleep.     No facility-administered medications prior to visit.     Allergies:   Jardiance [empagliflozin] and Lopressor [metoprolol]   Social History   Socioeconomic History  . Marital status: Divorced    Spouse name: Not on file  . Number of children: 5  . Years of education: 10  . Highest education level: 10th grade  Occupational History  . Occupation: Retired  Tobacco Use  . Smoking status: Current Every Day Smoker    Packs/day: 0.10    Years: 55.00    Pack years: 5.50    Types: Cigarettes    Start date: 03/11/1961  . Smokeless tobacco: Never Used  . Tobacco comment: smoking 1 to 2 cigarettes per day  Vaping Use  . Vaping Use: Never used  Substance and Sexual Activity  . Alcohol use: No  . Drug use: No  . Sexual activity: Yes    Birth control/protection: None  Other Topics Concern  . Not on file  Social History Narrative  . Not on file   Social Determinants of Health   Financial Resource Strain: Low Risk   . Difficulty of Paying Living Expenses: Not hard at all  Food Insecurity: No Food Insecurity  . Worried About Charity fundraiser in the Last Year: Never true  . Ran Out of Food in the Last Year: Never true  Transportation Needs: No Transportation Needs  . Lack of Transportation (Medical): No  . Lack of Transportation (Non-Medical): No  Physical Activity:  Inactive  . Days of Exercise per Week: 0 days  . Minutes of Exercise per Session: 0 min  Stress: No Stress Concern Present  . Feeling of Stress : Not at all  Social Connections: Moderately Isolated  . Frequency of Communication with Friends and Family: More than three times a week  . Frequency of Social Gatherings with Friends and Family: More than three times a week  . Attends Religious Services: Never  . Active Member of Clubs or Organizations: No  . Attends Archivist Meetings: Never  .  Marital Status: Living with partner     Family History:  The patient's ***family history includes Diabetes in his father; Heart disease in his father; Hypertension in his mother and sister; Stroke in his father.   Review of Systems:   Please see the history of present illness.     General:  No chills, fever, night sweats or weight changes.  Cardiovascular:  No chest pain, dyspnea on exertion, edema, orthopnea, palpitations, paroxysmal nocturnal dyspnea. Dermatological: No rash, lesions/masses Respiratory: No cough, dyspnea Urologic: No hematuria, dysuria Abdominal:   No nausea, vomiting, diarrhea, bright red blood per rectum, melena, or hematemesis Neurologic:  No visual changes, wkns, changes in mental status. All other systems reviewed and are otherwise negative except as noted above.   Physical Exam:    VS:  There were no vitals taken for this visit.   General: Well developed, well nourished,male appearing in no acute distress. Head: Normocephalic, atraumatic, sclera non-icteric.  Neck: No carotid bruits. JVD not elevated.  Lungs: Respirations regular and unlabored, without wheezes or rales.  Heart: ***Regular rate and rhythm. No S3 or S4.  No murmur, no rubs, or gallops appreciated. Abdomen: Soft, non-tender, non-distended. No obvious abdominal masses. Msk:  Strength and tone appear normal for age. No obvious joint deformities or effusions. Extremities: No clubbing or cyanosis.  No edema.  Distal pedal pulses are 2+ bilaterally. Neuro: Alert and oriented X 3. Moves all extremities spontaneously. No focal deficits noted. Psych:  Responds to questions appropriately with a normal affect. Skin: No rashes or lesions noted  Wt Readings from Last 3 Encounters:  09/21/19 182 lb (82.6 kg)  09/13/19 183 lb 6.4 oz (83.2 kg)  07/05/19 181 lb 12.8 oz (82.5 kg)        Studies/Labs Reviewed:   EKG:  EKG is*** ordered today.  The ekg ordered today demonstrates ***  Recent Labs: 06/28/2019: Hemoglobin 15.3; Platelets 302; TSH 1.066 09/13/2019: ALT 13; BUN 11; Creatinine, Ser 0.85; Potassium 4.0; Sodium 137   Lipid Panel    Component Value Date/Time   CHOL 136 04/25/2019 1608   TRIG 175 (H) 04/25/2019 1608   HDL 28 (L) 04/25/2019 1608   CHOLHDL 4.9 04/25/2019 1608   LDLCALC 78 04/25/2019 1608    Additional studies/ records that were reviewed today include:   Echocardiogram: 01/2018 Study Conclusions   - Left ventricle: The cavity size was normal. Wall thickness was  normal. Systolic function was normal. The estimated ejection  fraction was in the range of 60% to 65%. Although no diagnostic  regional wall motion abnormality was identified, this possibility  cannot be completely excluded on the basis of this study.  Indeterminate diastolic function.  - Aortic valve: Mildly calcified annulus. Probably trileaflet;  moderately calcified leaflets.  - Mitral valve: Moderately calcified annulus. There was trivial  regurgitation.  - Right ventricle: Systolic function was mildly reduced.  - Right atrium: Central venous pressure (est): 8 mm Hg.  - Tricuspid valve: There was trivial regurgitation.  - Pulmonary arteries: Systolic pressure could not be accurately  estimated.  - Pericardium, extracardiac: A prominent pericardial fat pad was  present. Cannot exclude small anteroapical pericardial effusion  with signs of chronicity.   NST: 03/2018  No  diagnostic ST segment changes to indicate ischemia.  Medium sized, severe intensity, inferior defect extending from apex to base with inferior septal defect at the base. These regions are fixed and suggestive of scar versus soft tissue attenuation.  This is an intermediate risk study.  Nuclear stress EF: 48%. Visually, LVEF appears normal, consider echocardiogram if not done recently.  Assessment:    No diagnosis found.   Plan:   In order of problems listed above:  1. ***    Medication Adjustments/Labs and Tests Ordered: Current medicines are reviewed at length with the patient today.  Concerns regarding medicines are outlined above.  Medication changes, Labs and Tests ordered today are listed in the Patient Instructions below. There are no Patient Instructions on file for this visit.   Signed, Erma Heritage, PA-C  10/19/2019 11:01 AM    [facility] [address] [place], [place] [address] Phone: [phone] Fax: [phone]

## 2019-10-21 ENCOUNTER — Other Ambulatory Visit (HOSPITAL_COMMUNITY): Payer: Self-pay | Admitting: Nurse Practitioner

## 2019-10-21 DIAGNOSIS — G479 Sleep disorder, unspecified: Secondary | ICD-10-CM

## 2019-10-24 ENCOUNTER — Encounter: Payer: Self-pay | Admitting: Internal Medicine

## 2019-10-24 ENCOUNTER — Other Ambulatory Visit: Payer: Self-pay

## 2019-10-24 ENCOUNTER — Ambulatory Visit (INDEPENDENT_AMBULATORY_CARE_PROVIDER_SITE_OTHER): Payer: Medicare Other | Admitting: Internal Medicine

## 2019-10-24 DIAGNOSIS — I2510 Atherosclerotic heart disease of native coronary artery without angina pectoris: Secondary | ICD-10-CM

## 2019-10-24 DIAGNOSIS — F17210 Nicotine dependence, cigarettes, uncomplicated: Secondary | ICD-10-CM

## 2019-10-24 DIAGNOSIS — I10 Essential (primary) hypertension: Secondary | ICD-10-CM

## 2019-10-24 DIAGNOSIS — J449 Chronic obstructive pulmonary disease, unspecified: Secondary | ICD-10-CM

## 2019-10-24 MED ORDER — OLMESARTAN MEDOXOMIL 40 MG PO TABS: 40.0000 mg | ORAL_TABLET | Freq: Every day | ORAL | 11 refills | Status: DC

## 2019-10-24 MED ORDER — TRELEGY ELLIPTA 100-62.5-25 MCG/INH IN AEPB
1.0000 | INHALATION_SPRAY | Freq: Every day | RESPIRATORY_TRACT | 0 refills | Status: DC
Start: 2019-10-24 — End: 2019-10-24

## 2019-10-24 MED ORDER — TRELEGY ELLIPTA 100-62.5-25 MCG/INH IN AEPB
INHALATION_SPRAY | RESPIRATORY_TRACT | 11 refills | Status: DC
Start: 2019-10-24 — End: 2019-12-02

## 2019-10-24 NOTE — Progress Notes (Addendum)
Starr Lake, male    DOB: 1945/12/17,    MRN: [id number]   Brief patient profile:  38 yowm active smoker with onset of sob around 2015 dx as copd at [facility] with GOLD III criteria then dx  RUL lung ca [name] nav bx then  rx chemo/RT summary rx    Non-small cell lung cancer, right ([place]) 1.  Stage IIIa (T1b N2 cM0) NSCLC, favoring adenocarcinoma: -Chemo XRT with weekly carboplatin and paclitaxel completed on 08/20/2016. -Consolidation durvalumab from 09/26/2016 through 09/25/2017.     History of Present Illness  10/24/2019  Pulmonary/ 1st office eval/Heidemarie Goodnow on breo 200/ incruse and lisinopril  Chief Complaint  Patient presents with  . Consult    Patient has shortness of breath with exertion. When resting he is fine. Productive cough with clear sputum. Had lung cancer and has COPD.   Dyspnea:  Walking x mailbox = 25 ft slt incline  Cough: am mucoid clear  Sleep: flat bed/ 2 pillows/ cpap  SABA use:  q 6h prn    No obvious day to day or daytime variability or assoc excess/ purulent sputum or mucus plugs or hemoptysis or cp or chest tightness, subjective wheeze or overt sinus or hb symptoms.   Sleeping as above  without nocturnal  or early am exacerbation  of respiratory  c/o's or need for noct saba. Also denies any obvious fluctuation of symptoms with weather or environmental changes or other aggravating or alleviating factors except as outlined above   No unusual exposure hx or h/o childhood pna/ asthma or knowledge of premature birth.  Current Allergies, Complete Past Medical History, Past Surgical History, Family History, and Social History were reviewed in [employer] [employer] record.  ROS  The following are not active complaints unless bolded Hoarseness, sore throat, dysphagia, dental problems, itching, sneezing,  nasal congestion or discharge of excess mucus or purulent secretions, ear ache,   fever, chills, sweats, unintended wt loss or wt gain,  classically pleuritic or exertional cp,  orthopnea pnd or arm/hand swelling  or leg swelling, presyncope, palpitations, abdominal pain, anorexia, nausea, vomiting, diarrhea  or change in bowel habits or change in bladder habits, change in stools or change in urine, dysuria, hematuria,  rash, arthralgias, visual complaints, headache, numbness, weakness or ataxia or problems with walking or coordination,  change in mood or  memory.           Past Medical History:  Diagnosis Date  . Adenocarcinoma of lung, right ([place]) 04/18/2016  . Anxiety   . Arthritis   . Asthma   . CHF (congestive heart failure) ([facility])   . COPD (chronic obstructive pulmonary disease) ([place])   . Depression   . Diabetes mellitus without complication (HCC)    no meds  . DM (diabetes mellitus) ([place]) 07/09/2016  . Dyspnea   . History of kidney stones   . Hyperlipidemia   . Hypertension   . Macular degeneration   . Neuropathy   . Non-small cell lung cancer, right ([place]) 04/18/2016  . Prostatitis   . Pulmonary nodule, left 07/16/2016  . Sleep apnea    cpap    Outpatient Medications Prior to Visit  Medication Sig Dispense Refill  . albuterol (VENTOLIN HFA) 108 (90 Base) MCG/ACT inhaler USE 2 PUFF BY MOUTH EVERY 6 HOURS AS NEEDED FOR SHORTNESS OF BREATH OR WHEEZING 18 g 3  . amLODipine (NORVASC) 5 MG tablet Take 1 tablet (5 mg total) by mouth daily. 90 tablet 3  .  aspirin EC 81 MG tablet Take 81 mg by mouth daily.    . Blood Glucose Monitoring Suppl (ONETOUCH VERIO REFLECT) w/Device KIT Use to test blood sugars daily as directed. DX: E11.9 1 kit 1  . BREO ELLIPTA 200-25 MCG/INH AEPB TAKE 1 PUFF BY MOUTH EVERY DAY 180 each 0  . cloNIDine (CATAPRES) 0.1 MG tablet TAKE 1 TABLET (0.1 MG TOTAL) BY MOUTH 3 (THREE) TIMES DAILY. 270 tablet 1  . dutasteride (AVODART) 0.5 MG capsule TAKE 1 CAPSULE BY MOUTH EVERY DAY 90 capsule 0  . fluticasone (FLONASE) 50 MCG/ACT nasal spray Place 2 sprays into both nostrils daily. 16 g 6  . furosemide  (LASIX) 20 MG tablet Take 1 tablet (20 mg total) by mouth 2 (two) times daily. 180 tablet 1  . gabapentin (NEURONTIN) 300 MG capsule TAKE 1 CAPSULE BY MOUTH IN THE MORNING AND 2 CAPSULES AT NIGHT 270 capsule 0  . glucose blood (ONETOUCH VERIO) test strip Use to test blood sugars daily as directed. DX: E11.9 100 each 12  . levothyroxine (SYNTHROID) 75 MCG tablet Take 1 tablet (75 mcg total) by mouth daily. 90 tablet 3  . lisinopril (ZESTRIL) 40 MG tablet Take 1 tablet (40 mg total) by mouth daily. 90 tablet 3  . meloxicam (MOBIC) 7.5 MG tablet Take 1 tablet (7.5 mg total) by mouth daily as needed for pain (back pain). 90 tablet 1  . metFORMIN (GLUCOPHAGE-XR) 500 MG 24 hr tablet TAKE 1 TABLET BY MOUTH EVERY DAY WITH BREAKFAST 30 tablet 4  . OneTouch Delica Lancets 25W MISC Use to test blood sugars daily as directed. DX: E11.9 100 each 1  . pravastatin (PRAVACHOL) 40 MG tablet Take 1 tablet (40 mg total) by mouth every 3 (three) days. M-W-F 90 tablet 1  . prochlorperazine (COMPAZINE) 10 MG tablet Take 1 tablet (10 mg total) by mouth every 6 (six) hours as needed for nausea or vomiting. 60 tablet 2  . Semaglutide (RYBELSUS) 7 MG TABS Take 7 mg by mouth every morning. 90 tablet 4  . tamsulosin (FLOMAX) 0.4 MG CAPS capsule TAKE 2 CAPSULES BY MOUTH EVERY DAY 180 capsule 0  . temazepam (RESTORIL) 15 MG capsule Take 15 mg by mouth at bedtime as needed for sleep.    Marland Kitchen zolpidem (AMBIEN) 5 MG tablet TAKE 1 TABLET (5 MG TOTAL) BY MOUTH AT BEDTIME AS NEEDED FOR SLEEP. 30 tablet 0   No facility-administered medications prior to visit.     Objective:     BP 130/82 (BP Location: Left Arm, Patient Position: Sitting, Cuff Size: Normal)   Pulse (!) 123   Temp 97.8 F (36.6 C) (Oral)   Ht _0  (1.676 m)   Wt 182 lb (82.6 kg)   SpO2 95% Comment: on room air  BMI 29.38 kg/m   SpO2: 95 % (on room air)   Mod obese hoarse wm nad with prominent pseudowheeze    HEENT : pt wearing mask not removed for exam  due to covid -19 concerns.    NECK :  without JVD/Nodes/TM/ nl carotid upstrokes bilaterally   LUNGS: no acc muscle use,  Nl contour chest with minimal insp/exp rhonchi R > L upper lobes bilaterally without cough on insp or exp maneuvers   CV:  RRR  no s3 or murmur or increase in P2, and 1+ pitting both LEs  ABD: obese  soft and nontender with nl inspiratory excursion in the supine position. No bruits or organomegaly appreciated, bowel sounds nl  MS:  Nl gait/ ext warm without deformities, calf tenderness, cyanosis or clubbing No obvious joint restrictions   SKIN: warm and dry without lesions    NEURO:  alert, approp, nl sensorium with  no motor or cerebellar deficits apparent.    I personally reviewed images and agree with radiology impression as follows:   Chest CT s contrast 06/28/19 1. Stable radiation change within the right upper lobe. 2. Stable appearance of FDG avid pleural thickening along the paravertebral aspect of the superior segment of right lower lobe. 3. Small nodules within the right upper lobe are slightly increased in size in the interval. Attention on follow-up imaging advised. 4. Emphysema and aortic atherosclerosis.    Assessment   COPD GOLD III/ group D still smoking  Active smoker - Spirometry 10/24/2019  FEV1 0.94 (32%)  Ratio 0.48 - 10/24/2019  After extensive coaching inhaler device,  effectiveness =    90% > change breo 200/ incruse to Trelegy 100 q am    Group D in terms of symptom/risk and laba/lama/ICS  therefore appropriate rx at this point >>>  trelegy best choice if insurance covers - higher dose ics not indicated as higher risk of infection   Advised:  formulary restrictions will be an ongoing challenge for the forseable future and I would be happy to pick an alternative if the pt will first  provide me a list of them -  pt  will need to return here for training for any new device that is required eg dpi vs hfa vs respimat.    In the meantime  we can always provide samples so that the patient never runs out of any needed respiratory medications.      Essential hypertension Try off acedi 10/24/2019 due to pseudowheeze on exam   In the best review of chronic cough to date ( NEJM 2016 375 1544-1551) ,  ACEi are now felt to cause cough in up to  20% of pts which is a 4 fold increase from previous reports and does not include the variety of non-specific complaints we see in pulmonary clinic in pts on ACEi but previously attributed to another dx like  Copd/asthma and  include PNDS, throat and chest congestion, "bronchitis", unexplained dyspnea and noct "strangling" sensations, and hoarseness, but also  atypical /refractory GERD symptoms like dysphagia and "bad heartburn"   The only way I know  to prove this is not an "ACEi Case" is a trial off ACEi x a minimum of 6 weeks then regroup.    >>> try benicar 40 mg daily and f/u in 6 weeks.      Cigarette smoker Counseled re importance of smoking cessation but did not meet time criteria for separate billing            Each maintenance medication was reviewed in detail including emphasizing most importantly the difference between maintenance and prns and under what circumstances the prns are to be triggered using an action plan format where appropriate.  Total time for H and P, chart review, counseling, teaching device and generating customized AVS unique to this office visit / charting = 45 min          Christinia Gully, MD 10/24/2019

## 2019-10-24 NOTE — Patient Instructions (Addendum)
Stop lisinopril substitute  benicar 40 mg daily in place of lisinopril  - if blood pressure too low then reduce or stop the amlodipine as it is likely contributing to your fluid in your legs  Stop breo and incruse  and substitute Trelogy one click am two good drags hold at least 5 seconds and out thru the nose  Only use your albuterol as a rescue medication to be used if you can't catch your breath by resting or doing a relaxed purse lip breathing pattern.  - The less you use it, the better it will work when you need it. - Ok to use up to 2 puffs  every 4 hours if you must but call for immediate appointment if use goes up over your usual need - Don't leave home without it !!  (think of it like the spare tire for your car) ' Try albuterol 15 min before an activity that you know would make you short of breath and see if it makes any difference and if makes none then don't take it after activity unless you can't catch your breath.      The key is to stop smoking completely before smoking completely stops you!      Please schedule a follow up office visit in 6 weeks, call sooner if needed

## 2019-10-24 NOTE — Assessment & Plan Note (Signed)
Try off acedi 10/24/2019 due to pseudowheeze on exam   In the best review of chronic cough to date ( NEJM 2016 375 1544-1551) ,  ACEi are now felt to cause cough in up to  20% of pts which is a 4 fold increase from previous reports and does not include the variety of non-specific complaints we see in pulmonary clinic in pts on ACEi but previously attributed to another dx like  Copd/asthma and  include PNDS, throat and chest congestion, "bronchitis", unexplained dyspnea and noct "strangling" sensations, and hoarseness, but also  atypical /refractory GERD symptoms like dysphagia and "bad heartburn"   The only way I know  to prove this is not an "ACEi Case" is a trial off ACEi x a minimum of 6 weeks then regroup.    >>> try benicar 40 mg daily and f/u in 6 weeks.

## 2019-10-24 NOTE — Assessment & Plan Note (Signed)
Active smoker - Spirometry 10/24/2019  FEV1 0.94 (32%)  Ratio 0.48 - 10/24/2019  After extensive coaching inhaler device,  effectiveness =    90% > change breo 200/ incruse to Trelegy 100 q am    Group D in terms of symptom/risk and laba/lama/ICS  therefore appropriate rx at this point >>>  trelegy best choice if insurance covers - higher dose ics not indicated as higher risk of infection   Advised:  formulary restrictions will be an ongoing challenge for the forseable future and I would be happy to pick an alternative if the pt will first  provide me a list of them -  pt  will need to return here for training for any new device that is required eg dpi vs hfa vs respimat.    In the meantime we can always provide samples so that the patient never runs out of any needed respiratory medications.

## 2019-10-24 NOTE — Assessment & Plan Note (Signed)
Counseled re importance of smoking cessation but did not meet time criteria for separate billing            Each maintenance medication was reviewed in detail including emphasizing most importantly the difference between maintenance and prns and under what circumstances the prns are to be triggered using an action plan format where appropriate.  Total time for H and P, chart review, counseling, teaching device and generating customized AVS unique to this office visit / charting = 45 min

## 2019-10-26 ENCOUNTER — Ambulatory Visit (INDEPENDENT_AMBULATORY_CARE_PROVIDER_SITE_OTHER): Payer: Medicare Other

## 2019-10-26 DIAGNOSIS — I739 Peripheral vascular disease, unspecified: Secondary | ICD-10-CM

## 2019-10-27 ENCOUNTER — Telehealth: Payer: Self-pay | Admitting: *Deleted

## 2019-10-27 DIAGNOSIS — I739 Peripheral vascular disease, unspecified: Secondary | ICD-10-CM

## 2019-10-27 NOTE — Telephone Encounter (Signed)
-----   Message from John [name], [place] sent at 10/27/2019  8:42 AM EDT ----- Please let the patient know his lower extremity ABI's and dopplers showed at least moderate plaque along his right lower extremity with 75-99% stenosis along the superficial femoral artery. Less than 50% stenosis along the left. Given his claudication symptoms and plaque, please refer him to Dr. Fletcher Anon or Dr. Gwenlyn Found for further management of his peripheral arterial disease.

## 2019-11-07 ENCOUNTER — Other Ambulatory Visit: Payer: Self-pay

## 2019-11-07 ENCOUNTER — Inpatient Hospital Stay (HOSPITAL_COMMUNITY): Payer: Medicare Other | Attending: Hematology

## 2019-11-07 ENCOUNTER — Ambulatory Visit (HOSPITAL_COMMUNITY)
Admission: RE | Admit: 2019-11-07 | Discharge: 2019-11-07 | Disposition: A | Discharge status: Home / Self Care | Payer: Medicare Other | Source: Ambulatory Visit | Attending: Hematology | Admitting: Hematology

## 2019-11-07 DIAGNOSIS — I700 Atherosclerosis of aorta: Secondary | ICD-10-CM | POA: Insufficient documentation

## 2019-11-07 DIAGNOSIS — C3491 Malignant neoplasm of unspecified part of right bronchus or lung: Secondary | ICD-10-CM

## 2019-11-07 DIAGNOSIS — Z7984 Long term (current) use of oral hypoglycemic drugs: Secondary | ICD-10-CM | POA: Diagnosis not present

## 2019-11-07 DIAGNOSIS — I10 Essential (primary) hypertension: Secondary | ICD-10-CM | POA: Insufficient documentation

## 2019-11-07 DIAGNOSIS — C3490 Malignant neoplasm of unspecified part of unspecified bronchus or lung: Secondary | ICD-10-CM | POA: Diagnosis not present

## 2019-11-07 DIAGNOSIS — Z7982 Long term (current) use of aspirin: Secondary | ICD-10-CM | POA: Diagnosis not present

## 2019-11-07 DIAGNOSIS — J449 Chronic obstructive pulmonary disease, unspecified: Secondary | ICD-10-CM | POA: Insufficient documentation

## 2019-11-07 DIAGNOSIS — C3411 Malignant neoplasm of upper lobe, right bronchus or lung: Secondary | ICD-10-CM | POA: Insufficient documentation

## 2019-11-07 DIAGNOSIS — Z923 Personal history of irradiation: Secondary | ICD-10-CM | POA: Insufficient documentation

## 2019-11-07 DIAGNOSIS — Z9221 Personal history of antineoplastic chemotherapy: Secondary | ICD-10-CM | POA: Diagnosis not present

## 2019-11-07 DIAGNOSIS — E039 Hypothyroidism, unspecified: Secondary | ICD-10-CM | POA: Diagnosis not present

## 2019-11-07 DIAGNOSIS — I509 Heart failure, unspecified: Secondary | ICD-10-CM | POA: Diagnosis not present

## 2019-11-07 DIAGNOSIS — G4730 Sleep apnea, unspecified: Secondary | ICD-10-CM | POA: Diagnosis not present

## 2019-11-07 DIAGNOSIS — Z79899 Other long term (current) drug therapy: Secondary | ICD-10-CM | POA: Insufficient documentation

## 2019-11-07 DIAGNOSIS — F17210 Nicotine dependence, cigarettes, uncomplicated: Secondary | ICD-10-CM | POA: Insufficient documentation

## 2019-11-07 DIAGNOSIS — E119 Type 2 diabetes mellitus without complications: Secondary | ICD-10-CM | POA: Diagnosis not present

## 2019-11-07 DIAGNOSIS — I2510 Atherosclerotic heart disease of native coronary artery without angina pectoris: Secondary | ICD-10-CM | POA: Diagnosis not present

## 2019-11-07 DIAGNOSIS — E785 Hyperlipidemia, unspecified: Secondary | ICD-10-CM | POA: Diagnosis not present

## 2019-11-07 LAB — CBC WITH DIFFERENTIAL/PLATELET
Abs Immature Granulocytes: 0.01 10*3/uL (ref 0.00–0.07)
Basophils Absolute: 0.1 10*3/uL (ref 0.0–0.1)
Basophils Relative: 1 %
Eosinophils Absolute: 0.2 10*3/uL (ref 0.0–0.5)
Eosinophils Relative: 3 %
HCT: 47.1 % (ref 39.0–52.0)
Hemoglobin: 15.2 g/dL (ref 13.0–17.0)
Immature Granulocytes: 0 %
Lymphocytes Relative: 9 %
Lymphs Abs: 0.7 10*3/uL (ref 0.7–4.0)
MCH: 32.8 pg (ref 26.0–34.0)
MCHC: 32.3 g/dL (ref 30.0–36.0)
MCV: 101.5 fL — ABNORMAL HIGH (ref 80.0–100.0)
Monocytes Absolute: 0.9 10*3/uL (ref 0.1–1.0)
Monocytes Relative: 12 %
Neutro Abs: 6.1 10*3/uL (ref 1.7–7.7)
Neutrophils Relative %: 75 %
Platelets: 276 10*3/uL (ref 150–400)
RBC: 4.64 MIL/uL (ref 4.22–5.81)
RDW: 14.7 % (ref 11.5–15.5)
WBC: 8 10*3/uL (ref 4.0–10.5)
nRBC: 0 % (ref 0.0–0.2)

## 2019-11-07 LAB — COMPREHENSIVE METABOLIC PANEL
ALT: 19 U/L (ref 0–44)
AST: 18 U/L (ref 15–41)
Albumin: 4.2 g/dL (ref 3.5–5.0)
Alkaline Phosphatase: 101 U/L (ref 38–126)
Anion gap: 10 (ref 5–15)
BUN: 15 mg/dL (ref 8–23)
CO2: 26 mmol/L (ref 22–32)
Calcium: 9.5 mg/dL (ref 8.9–10.3)
Chloride: 101 mmol/L (ref 98–111)
Creatinine, Ser: 0.75 mg/dL (ref 0.61–1.24)
GFR calc Af Amer: >60 mL/min (ref >60)
GFR calc non Af Amer: >60 mL/min (ref >60)
Glucose, Bld: 213 mg/dL — ABNORMAL HIGH (ref 70–99)
Potassium: 3.8 mmol/L (ref 3.5–5.1)
Sodium: 137 mmol/L (ref 135–145)
Total Bilirubin: 0.5 mg/dL (ref 0.3–1.2)
Total Protein: 7.8 g/dL (ref 6.5–8.1)

## 2019-11-07 MED ORDER — IOHEXOL 300 MG/ML  SOLN
75.0000 mL | Freq: Once | INTRAMUSCULAR | Status: AC | PRN
  Administered 2019-11-07: 75 mL via INTRAVENOUS

## 2019-11-09 ENCOUNTER — Inpatient Hospital Stay (HOSPITAL_BASED_OUTPATIENT_CLINIC_OR_DEPARTMENT_OTHER): Payer: Medicare Other | Admitting: Hematology

## 2019-11-09 VITALS — BP 152/62 | HR 110 | Temp 97.2°F | Resp 18 | Wt 182.8 lb

## 2019-11-09 DIAGNOSIS — E039 Hypothyroidism, unspecified: Secondary | ICD-10-CM | POA: Diagnosis not present

## 2019-11-09 DIAGNOSIS — I509 Heart failure, unspecified: Secondary | ICD-10-CM | POA: Diagnosis not present

## 2019-11-09 DIAGNOSIS — I700 Atherosclerosis of aorta: Secondary | ICD-10-CM | POA: Diagnosis not present

## 2019-11-09 DIAGNOSIS — I2510 Atherosclerotic heart disease of native coronary artery without angina pectoris: Secondary | ICD-10-CM | POA: Diagnosis not present

## 2019-11-09 DIAGNOSIS — C3491 Malignant neoplasm of unspecified part of right bronchus or lung: Secondary | ICD-10-CM

## 2019-11-09 DIAGNOSIS — J449 Chronic obstructive pulmonary disease, unspecified: Secondary | ICD-10-CM | POA: Diagnosis not present

## 2019-11-09 DIAGNOSIS — C3411 Malignant neoplasm of upper lobe, right bronchus or lung: Secondary | ICD-10-CM | POA: Diagnosis not present

## 2019-11-09 NOTE — Progress Notes (Signed)
John [name], [place] [address]   CLINIC:  Medical Oncology/Hematology  PCP:  [name], [place] / [place] [address] [phone]   REASON FOR VISIT:  Follow-up for NSCLC  PRIOR THERAPY:  1. Chemoradiation with weekly carboplatin and paclitaxel from 06/25/2016 to 08/20/2016. 2. Consolidation durvalumab from 09/26/2016 to 09/25/2017.  NGS Results: Not done  CURRENT THERAPY: Surveillance  BRIEF ONCOLOGIC HISTORY:  Oncology History  Non-small cell lung cancer, right ([place])  04/17/2016 Imaging   CT chest- 1. Examination is positive for bilateral upper lobe pulmonary lesions suspicious for malignancy. Further investigation with PET-CT and tissue sampling is advised. 2. Borderline enlarged paratracheal lymph nodes and right hilar nodes. Cannot rule out metastatic adenopathy. Attention to these areas on PET-CT is advised. 3. Emphysema 4. Aortic atherosclerosis and multi vessel coronary artery calcification.   04/18/2016 Initial Diagnosis   Adenocarcinoma of lung, right ([place])   04/24/2016 PET scan   1. Adjacent hypermetabolic nodules in the RIGHT upper lobe consistent bronchogenic carcinoma. 2. Suspicion of ipsilateral hilar and paratracheal nodal metastasis. 3. Nodule in the LEFT upper lobe with suspicious morphology has a relatively low metabolic activity for size. Favor synchronous primary bronchogenic carcinoma. 4. No metabolic activity associated with a small LEFT lower lobe pulmonary nodule. Recommend attention on follow-up.   05/06/2016 Imaging   MRI brain- Negative for metastatic disease.   05/28/2016 Procedure   1. Electromagnetic navigational bronchoscopy with brushings, needle     aspirations and biopsies of bilateral upper lobe nodules. 2. Endobronchial ultrasound with mediastinal and hilar lymph node     aspirations. By Dr. [name]   06/03/2016 Pathology Results   Diagnosis 1. Lung, biopsy,  Right upper lobe - BLOOD AND BENIGN BRONCHIAL AND LUNG TISSUE. 2. Lung, biopsy, Left upper lobe - BLOOD AND BENIGN BRONCHIAL AND LUNG TISSUE.   06/03/2016 Pathology Results   Diagnosis TRANSBRONCHIAL NEEDLE ASPIRATION, NAVIGATION, RUL (SPECIMEN 5 OF 7, COLLECTED 05/28/16): MALIGNANT CELLS CONSISTENT WITH NON-SMALL CELL CARCINOMA Comment There are limited tumor cells in the cell block (insufficient for molecular testing). TTF-1 is positive in a few of the atypical cells. They appear negative for synaptophysin and cytokeratin 5/6. Overall there is limited tumor, but a lung adenocarcinoma is slightly favored. Dr. [name] has reviewed the case. The case was called to Dr. [name] on 06/03/2016.   06/13/2016 Procedure   Port placed by Dr. Arnoldo Morale   06/25/2016 - 08/15/2016 Chemotherapy   The patient had palonosetron (ALOXI) injection 0.25 mg, 0.25 mg, Intravenous,  Once, 6 of 6 cycles  CARBOplatin (PARAPLATIN) 220 mg in sodium chloride 0.9 % 250 mL chemo infusion, 220 mg (100 % of original dose 215.8 mg), Intravenous,  Once, 6 of 6 cycles Dose modification:   (original dose 215.8 mg, Cycle 1),   (original dose 215.8 mg, Cycle 5),   (original dose 215.8 mg, Cycle 6),   (original dose 215.8 mg, Cycle 2),   (original dose 215.8 mg, Cycle 3),   (original dose 215.8 mg, Cycle 4)  PACLitaxel (TAXOL) 90 mg in dextrose 5 % 250 mL chemo infusion ( for chemotherapy treatment.     06/25/2016 - 08/20/2016 Radiation Therapy   Radiation in [place], [place] by Dr. Quitman Livings.  R lung to 6600 cGy   07/09/2016 - 07/10/2016 Hospital Admission   Admit date: 07/09/2016  Admission diagnosis: Fever and chills Additional comments: Negative work-up, discharged with course of Levaquin   07/09/2016 Treatment Plan Change   Treatment  deferred x 1 week due to hospitalization    08/05/2016 - 08/06/2016 Hospital Admission   Admit date: 08/05/2016 Admission diagnosis: Joint pain Additional comments: Treated with steroids with symptomatic  improvement   08/25/2016 Imaging   MRI brain- Negative for intracranial metastasis on this motion degraded examination.  Stable mild-to-moderate chronic small vessel ischemic disease.   09/18/2016 PET scan   1. Partial metabolic response. Right upper lobe 2.6 cm hypermetabolic pulmonary nodule, decreased in size and metabolism. Mildly hypermetabolic right hilar nodal metastasis, decreased in metabolism. Right paratracheal lymph node is no longer hypermetabolic. No new or progressive metastatic disease. 2. Apical left upper lobe 0.9 cm solid pulmonary nodule, slightly decreased in size, with no significant metabolism. The change in size could indicate a neoplastic etiology for this nodule. 3. Additional subcentimeter pulmonary nodules in the left lung are stable and below PET resolution . 4. Additional findings include aortic atherosclerosis, coronary atherosclerosis, moderate emphysema, stable small pericardial effusion/thickening and mild sigmoid diverticulosis.   01/13/2017 Imaging   CT chest with contrast IMPRESSION: 1. No change in size of the hypermetabolic nodular thickening in the RIGHT upper lobe. 2. LEFT upper lobe spiculated nodule is stable. 3. Rounded LEFT lower lobe pulmonary nodule is stable. 4. No evidence of lung cancer progression.   05/08/2017 Adverse Reaction   Development of Hypothyroidism- likely secondary to immunotherapy.  Levothyroxine therapy initiated.      CANCER STAGING: Cancer Staging Non-small cell lung cancer, right [facility]) Staging form: Lung, AJCC 8th Edition - Clinical stage from 06/05/2016: Stage IIIA (cT1b(2), cN2, cM0) - Signed by Baird Cancer, PA-C on 08/21/2016   INTERVAL HISTORY:  John [name], a 74 y.o. male, returns for routine follow-up of his NSCLC. John [name] was last seen on 07/05/2019.  Today he is accompanied by his wife. He reports feeling well and denies having any recent infections, his productive cough with grey sputum and  SOB is stable.  He will get a stent in his right leg on 8/25. He got his second COVID vaccine on 08/05/2019.   REVIEW OF SYSTEMS:  Review of Systems  Constitutional: Positive for appetite change (mildly decreased) and fatigue (moderate).  Respiratory: Positive for cough (productive with grey sputum; stable) and shortness of breath (d/t COPD).   Gastrointestinal: Positive for constipation and nausea. Negative for abdominal pain.  Genitourinary: Positive for difficulty urinating.   Neurological: Positive for numbness (feet and hands).  Psychiatric/Behavioral: Positive for sleep disturbance.  All other systems reviewed and are negative.   PAST MEDICAL/SURGICAL HISTORY:  Past Medical History:  Diagnosis Date  . Adenocarcinoma of lung, right ([place]) 04/18/2016  . Anxiety   . Arthritis   . Asthma   . CHF (congestive heart failure) ([place])   . COPD (chronic obstructive pulmonary disease) ([place])   . Depression   . Diabetes mellitus without complication ([facility])    no meds  . DM (diabetes mellitus) ([place]) 07/09/2016  . Dyspnea   . History of kidney stones   . Hyperlipidemia   . Hypertension   . Macular degeneration   . Neuropathy   . Non-small cell lung cancer, right ([place]) 04/18/2016  . Prostatitis   . Pulmonary nodule, left 07/16/2016  . Sleep apnea    cpap   Past Surgical History:  Procedure Laterality Date  . CATARACT EXTRACTION, BILATERAL Bilateral   . NO PAST SURGERIES    . PORTACATH PLACEMENT Left 06/13/2016   Procedure: INSERTION PORT-A-CATH;  Surgeon: Aviva Signs, MD;  Location: [facility];  Service:  General;  Laterality: Left;  Marland Kitchen VIDEO BRONCHOSCOPY WITH ENDOBRONCHIAL NAVIGATION N/A 05/28/2016   Procedure: VIDEO BRONCHOSCOPY WITH ENDOBRONCHIAL NAVIGATION;  Surgeon: [name], MD;  Location: [facility];  Service: Thoracic;  Laterality: N/A;  . VIDEO BRONCHOSCOPY WITH ENDOBRONCHIAL ULTRASOUND N/A 05/28/2016   Procedure: VIDEO BRONCHOSCOPY WITH ENDOBRONCHIAL ULTRASOUND;  Surgeon:  [name], MD;  Location: [facility];  Service: Thoracic;  Laterality: N/A;    SOCIAL HISTORY:  Social History   Socioeconomic History  . Marital status: Divorced    Spouse name: Not on file  . Number of children: 5  . Years of education: 10  . Highest education level: 10th grade  Occupational History  . Occupation: Retired  Tobacco Use  . Smoking status: Current Every Day Smoker    Packs/day: 0.50    Years: 55.00    Pack years: 27.50    Types: Cigarettes    Start date: 03/11/1961  . Smokeless tobacco: Never Used  . Tobacco comment: half pack to pack a day just depends on what he is doing  Vaping Use  . Vaping Use: Never used  Substance and Sexual Activity  . Alcohol use: No  . Drug use: No  . Sexual activity: Yes    Birth control/protection: None  Other Topics Concern  . Not on file  Social History Narrative  . Not on file   Social Determinants of Health   Financial Resource Strain: Low Risk   . Difficulty of Paying Living Expenses: Not hard at all  Food Insecurity: No Food Insecurity  . Worried About Charity fundraiser in the Last Year: Never true  . Ran Out of Food in the Last Year: Never true  Transportation Needs: No Transportation Needs  . Lack of Transportation (Medical): No  . Lack of Transportation (Non-Medical): No  Physical Activity: Inactive  . Days of Exercise per Week: 0 days  . Minutes of Exercise per Session: 0 min  Stress: No Stress Concern Present  . Feeling of Stress : Not at all  Social Connections: Moderately Isolated  . Frequency of Communication with Friends and Family: More than three times a week  . Frequency of Social Gatherings with Friends and Family: More than three times a week  . Attends Religious Services: Never  . Active Member of Clubs or Organizations: No  . Attends Archivist Meetings: Never  . Marital Status: Living with partner  Intimate Partner Violence: Not At Risk  . Fear of Current or Ex-Partner: No    . Emotionally Abused: No  . Physically Abused: No  . Sexually Abused: No    FAMILY HISTORY:  Family History  Problem Relation Age of Onset  . Hypertension Mother   . Diabetes Father   . Heart disease Father   . Stroke Father   . Hypertension Sister     CURRENT MEDICATIONS:  Current Outpatient Medications  Medication Sig Dispense Refill  . albuterol (VENTOLIN HFA) 108 (90 Base) MCG/ACT inhaler USE 2 PUFF BY MOUTH EVERY 6 HOURS AS NEEDED FOR SHORTNESS OF BREATH OR WHEEZING 18 g 3  . amLODipine (NORVASC) 5 MG tablet Take 1 tablet (5 mg total) by mouth daily. 90 tablet 3  . aspirin EC 81 MG tablet Take 81 mg by mouth daily.    . Blood Glucose Monitoring Suppl (ONETOUCH VERIO REFLECT) w/Device KIT Use to test blood sugars daily as directed. DX: E11.9 1 kit 1  . cloNIDine (CATAPRES) 0.1 MG tablet TAKE 1 TABLET (0.1  MG TOTAL) BY MOUTH 3 (THREE) TIMES DAILY. 270 tablet 1  . dutasteride (AVODART) 0.5 MG capsule TAKE 1 CAPSULE BY MOUTH EVERY DAY 90 capsule 0  . fluticasone (FLONASE) 50 MCG/ACT nasal spray Place 2 sprays into both nostrils daily. 16 g 6  . Fluticasone-Umeclidin-Vilant (TRELEGY ELLIPTA) 100-62.5-25 MCG/INH AEPB One click each am 1 each 11  . furosemide (LASIX) 20 MG tablet Take 1 tablet (20 mg total) by mouth 2 (two) times daily. 180 tablet 1  . gabapentin (NEURONTIN) 300 MG capsule TAKE 1 CAPSULE BY MOUTH IN THE MORNING AND 2 CAPSULES AT NIGHT 270 capsule 0  . glucose blood (ONETOUCH VERIO) test strip Use to test blood sugars daily as directed. DX: E11.9 100 each 12  . levothyroxine (SYNTHROID) 75 MCG tablet Take 1 tablet (75 mcg total) by mouth daily. 90 tablet 3  . meloxicam (MOBIC) 7.5 MG tablet Take 1 tablet (7.5 mg total) by mouth daily as needed for pain (back pain). 90 tablet 1  . metFORMIN (GLUCOPHAGE-XR) 500 MG 24 hr tablet TAKE 1 TABLET BY MOUTH EVERY DAY WITH BREAKFAST 30 tablet 4  . olmesartan (BENICAR) 40 MG tablet Take 1 tablet (40 mg total) by mouth daily. 30  tablet 11  . OneTouch Delica Lancets 30G MISC Use to test blood sugars daily as directed. DX: E11.9 100 each 1  . pravastatin (PRAVACHOL) 40 MG tablet Take 1 tablet (40 mg total) by mouth every 3 (three) days. M-W-F 90 tablet 1  . prochlorperazine (COMPAZINE) 10 MG tablet Take 1 tablet (10 mg total) by mouth every 6 (six) hours as needed for nausea or vomiting. 60 tablet 2  . Semaglutide (RYBELSUS) 7 MG TABS Take 7 mg by mouth every morning. 90 tablet 4  . tamsulosin (FLOMAX) 0.4 MG CAPS capsule TAKE 2 CAPSULES BY MOUTH EVERY DAY 180 capsule 0  . temazepam (RESTORIL) 15 MG capsule Take 15 mg by mouth at bedtime as needed for sleep.    Marland Kitchen zolpidem (AMBIEN) 5 MG tablet TAKE 1 TABLET (5 MG TOTAL) BY MOUTH AT BEDTIME AS NEEDED FOR SLEEP. 30 tablet 0   No current facility-administered medications for this visit.    ALLERGIES:  Allergies  Allergen Reactions  . Jardiance [Empagliflozin]     FEELS SLUGGISH, TIRED  . Lopressor [Metoprolol]     Fatigue    PHYSICAL EXAM:  Performance status (ECOG): 1 - Symptomatic but completely ambulatory  Vitals:   11/09/19 1414  BP: (!) 152/62  Pulse: (!) 110  Resp: 18  Temp: (!) 97.2 F (36.2 C)  SpO2: 96%   Wt Readings from Last 3 Encounters:  11/09/19 182 lb 12.8 oz (82.9 kg)  10/24/19 182 lb (82.6 kg)  09/21/19 182 lb (82.6 kg)   Physical Exam Vitals reviewed.  Constitutional:      Appearance: Normal appearance.  Cardiovascular:     Rate and Rhythm: Normal rate and regular rhythm.     Pulses: Normal pulses.     Heart sounds: Normal heart sounds.  Pulmonary:     Effort: Pulmonary effort is normal.     Breath sounds: Normal breath sounds.  Abdominal:     Palpations: Abdomen is soft. There is no mass.     Tenderness: There is no abdominal tenderness.  Musculoskeletal:     Right lower leg: Edema (1+) present.     Left lower leg: Edema (1+) present.  Neurological:     General: No focal deficit present.     Mental Status: He  is alert and  oriented to person, place, and time.  Psychiatric:        Mood and Affect: Mood normal.        Behavior: Behavior normal.      LABORATORY DATA:  I have reviewed the labs as listed.  CBC Latest Ref Rng & Units 11/07/2019 06/28/2019 04/25/2019  WBC 4.0 - 10.5 K/uL 8.0 9.2 7.6  Hemoglobin 13.0 - 17.0 g/dL 15.2 15.3 14.9  Hematocrit 39 - 52 % 47.1 47.5 44.6  Platelets 150 - 400 K/uL 276 302 271   CMP Latest Ref Rng & Units 11/07/2019 09/13/2019 06/28/2019  Glucose 70 - 99 mg/dL 213(H) 184(H) 196(H)  BUN 8 - 23 mg/dL '15 11 14  '$ Creatinine 0.61 - 1.24 mg/dL 0.75 0.85 0.79  Sodium 135 - 145 mmol/L 137 137 136  Potassium 3.5 - 5.1 mmol/L 3.8 4.0 3.9  Chloride 98 - 111 mmol/L 101 99 98  CO2 22 - 32 mmol/L '26 24 27  '$ Calcium 8.9 - 10.3 mg/dL 9.5 9.6 9.3  Total Protein 6.5 - 8.1 g/dL 7.8 7.2 8.1  Total Bilirubin 0.3 - 1.2 mg/dL 0.5 0.2 0.5  Alkaline Phos 38 - 126 U/L 101 128(H) 111  AST 15 - 41 U/L '18 16 22  '$ ALT 0 - 44 U/L '19 13 18    '$ DIAGNOSTIC IMAGING:  I have independently reviewed the scans and discussed with the patient. CT Chest W Contrast  Result Date: 11/07/2019 CLINICAL DATA:  Follow-up non-small cell lung cancer diagnosed in 1 he 18 status post chemo and radiotherapy. No history of surgery. EXAM: CT CHEST WITH CONTRAST TECHNIQUE: Multidetector CT imaging of the chest was performed during intravenous contrast administration. CONTRAST:  27m OMNIPAQUE IOHEXOL 300 MG/ML  SOLN COMPARISON:  June 28, 2019 and multiple prior studies. FINDINGS: Cardiovascular: Calcified and noncalcified atheromatous plaque of the thoracic aorta. No sign of aneurysm. Marked calcific and noncalcific changes are unchanged. LEFT-sided Port-A-Cath terminates at the distal aspect of the superior vena cava. No pericardial effusion or pericardial thickening beyond what was present on the prior study near the superior aspect of the pericardium. Central pulmonary vasculature is unremarkable on venous phase assessment.  Mediastinum/Nodes: Thoracic inlet structures are normal. No axillary lymphadenopathy. No mediastinal lymphadenopathy. Soft tissue about the RIGHT posterior suprahilar region adjacent to the mediastinal border in the medial RIGHT lung measures 2.5 x 2.8 cm more confluent than on the previous exam, developing increasing consolidative appearance over a series of prior imaging studies. On the prior study when measured in a similar location this shows a similar size but with further volume loss in the area. Lungs/Pleura: Increasing size of a spiculated nodular density in the superior segment of the RIGHT lower lobe (image 66, series 4) Increasing size of nodular densities in the RIGHT upper lobe now confluent, largest area on image 38 of series 4 measuring 11 x 5 mm. Increasing size of a nodule in the LEFT upper lobe 11 x 5 mm previously 9 mm greatest dimension. New spiculated nodule in the LEFT upper lobe (image 35, series 4) 6 mm. Stable small LEFT lower lobe pulmonary nodule 6 mm, unchanged dating back to December of 2020. Pulmonary emphysema Upper Abdomen: No acute upper abdominal process. Adrenal glands are normal. Musculoskeletal: No acute musculoskeletal process. Spinal degenerative changes. IMPRESSION: 1. Increasing size of upper lobe pulmonary nodules with new nodule in the LEFT upper lobe as described. Findings are suspicious for metastases, PET CT may be helpful for further assessment. There is  also increasing confluent soft tissue in the RIGHT suprahilar region though this is favored to represent evolving post radiation changes. 2. Also with spiculated area in the superior segment of the RIGHT lower lobe along the margin of post radiation changes (image 66, series 4), attention on follow-up. 3. Pulmonary emphysema 4. Aortic atherosclerosis. Aortic Atherosclerosis (ICD10-I70.0). Electronically Signed   By: Zetta Bills M.D.   On: 11/07/2019 13:40   VAS [place] LOWER EXT ART SEG MULTI (SEGMENTALS & LE  RAYNAUDS)  Result Date: 10/27/2019 LOWER EXTREMITY DOPPLER STUDY Indications: Claudication, and Numbness and tingling in feet, bilaterally. High Risk Factors: Hypertension, hyperlipidemia, Diabetes, current smoker,                    coronary artery disease. Other Factors: Patient complains of pain in his calves with walking 50 feet that                is relieved with rest, right worse than left. This has been                getting progressively worse over the last three months.  Limitations: Today's exam was limited due to Cardiac arrhythmia. Performing Technologist: McFatter, Leafy Ro RVT  Examination Guidelines: A complete evaluation includes at minimum, Doppler waveform signals and systolic blood pressure reading at the level of bilateral brachial, anterior tibial, and posterior tibial arteries, when vessel segments are accessible. Bilateral testing is considered an integral part of a complete examination. Photoelectric Plethysmograph (PPG) waveforms and toe systolic pressure readings are included as required and additional duplex testing as needed. Limited examinations for reoccurring indications may be performed as noted.  ABI Findings: +---------+------------------+-----+----------+--------+ Right    Rt Pressure (mmHg)IndexWaveform  Comment  +---------+------------------+-----+----------+--------+ Brachial 150                    triphasic          +---------+------------------+-----+----------+--------+ CFA                             triphasic          +---------+------------------+-----+----------+--------+ Popliteal                       monophasic         +---------+------------------+-----+----------+--------+ ATA      103               0.69 monophasic         +---------+------------------+-----+----------+--------+ PTA      108               0.72 monophasic         +---------+------------------+-----+----------+--------+ PERO     99                0.66  monophasic         +---------+------------------+-----+----------+--------+ Great Toe81                0.54 Abnormal           +---------+------------------+-----+----------+--------+ +---------+------------------+-----+----------+-------+ Left     Lt Pressure (mmHg)IndexWaveform  Comment +---------+------------------+-----+----------+-------+ Brachial 145                    triphasic         +---------+------------------+-----+----------+-------+ CFA  triphasic         +---------+------------------+-----+----------+-------+ Popliteal                       triphasic         +---------+------------------+-----+----------+-------+ ATA      144               0.96 biphasic          +---------+------------------+-----+----------+-------+ PTA      149               0.99 triphasic         +---------+------------------+-----+----------+-------+ PERO     140               0.93 monophasic        +---------+------------------+-----+----------+-------+ Great Toe100               0.67 Abnormal          +---------+------------------+-----+----------+-------+  See Arterial Duplex study  Summary: Right: Resting right ankle-brachial index indicates moderate right lower extremity arterial disease. The right toe-brachial index is abnormal. Left: Resting left ankle-brachial index is within normal range. No evidence of significant left lower extremity arterial disease. The left toe-brachial index is abnormal.  *See table(s) above for measurements and observations.  Electronically signed by Ida Rogue MD on 10/27/2019 at 12:28:37 PM.    Final    VAS [place] LOWER EXTREMITY ARTERIAL DUPLEX  Result Date: 10/27/2019 LOWER EXTREMITY ARTERIAL DUPLEX STUDY Indications: Claudication, peripheral artery disease, and Numbness and tingling              in both feet. High Risk Factors: Hypertension, hyperlipidemia, Diabetes, current smoker,                     coronary artery disease. Other Factors: Patient complains of pain in his calves with walking 50 feet that                is relieved with rest, right worse than left. This has been                getting progressively worse over the last three months.  Current ABI: Right 0.72, Left 0.99 Performing Technologist: Jeneen Montgomery RDMS, RVT, RDCS  Examination Guidelines: A complete evaluation includes B-mode imaging, spectral Doppler, color Doppler, and power Doppler as needed of all accessible portions of each vessel. Bilateral testing is considered an integral part of a complete examination. Limited examinations for reoccurring indications may be performed as noted.  +-----------+--------+-----+---------------+----------+--------+ RIGHT      PSV cm/sRatioStenosis       Waveform  Comments +-----------+--------+-----+---------------+----------+--------+ CFA Prox   95                          triphasic          +-----------+--------+-----+---------------+----------+--------+ CFA Distal 117                         triphasic          +-----------+--------+-----+---------------+----------+--------+ DFA        193                                            +-----------+--------+-----+---------------+----------+--------+ SFA Prox   133  biphasic           +-----------+--------+-----+---------------+----------+--------+ SFA Mid    62                          biphasic           +-----------+--------+-----+---------------+----------+--------+ SFA Distal 432          75-99% stenosismonophasic         +-----------+--------+-----+---------------+----------+--------+ POP Prox   32                          monophasic         +-----------+--------+-----+---------------+----------+--------+ POP Mid                                monophasic         +-----------+--------+-----+---------------+----------+--------+ POP Distal 36                                              +-----------+--------+-----+---------------+----------+--------+ TP Trunk   48                                             +-----------+--------+-----+---------------+----------+--------+ ATA Mid    27                          monophasic         +-----------+--------+-----+---------------+----------+--------+ ATA Distal 26                          monophasic         +-----------+--------+-----+---------------+----------+--------+ PTA Mid    24                          monophasic         +-----------+--------+-----+---------------+----------+--------+ PTA Distal 28                          monophasic         +-----------+--------+-----+---------------+----------+--------+ PERO Distal             occluded                          +-----------+--------+-----+---------------+----------+--------+ A focal velocity elevation of 432 cm/s was obtained at Distal SFA with a VR of 5.9. Findings are characteristic of 75-99% stenosis.  +-----------+--------+-----+---------------+---------+--------+ LEFT       PSV cm/sRatioStenosis       Waveform Comments +-----------+--------+-----+---------------+---------+--------+ CFA Prox   116                         triphasic         +-----------+--------+-----+---------------+---------+--------+ CFA Distal 140                         triphasic         +-----------+--------+-----+---------------+---------+--------+ DFA        150                                           +-----------+--------+-----+---------------+---------+--------+  SFA Prox   167          30-49% stenosistriphasic         +-----------+--------+-----+---------------+---------+--------+ SFA Mid    119                         triphasic         +-----------+--------+-----+---------------+---------+--------+ SFA Distal 225          30-49% stenosistriphasic          +-----------+--------+-----+---------------+---------+--------+ POP Prox   86                          biphasic          +-----------+--------+-----+---------------+---------+--------+ POP Distal 78                          biphasic          +-----------+--------+-----+---------------+---------+--------+ TP Trunk   87                          biphasic          +-----------+--------+-----+---------------+---------+--------+ ATA Mid    54                          biphasic          +-----------+--------+-----+---------------+---------+--------+ ATA Distal 65                          biphasic          +-----------+--------+-----+---------------+---------+--------+ PTA Mid    87                          biphasic          +-----------+--------+-----+---------------+---------+--------+ PTA Distal 100                         biphasic          +-----------+--------+-----+---------------+---------+--------+ PERO Distal71                          biphasic          +-----------+--------+-----+---------------+---------+--------+ A focal velocity elevation of 225 cm/s was obtained at Distal SFA with a VR of 1.9. Findings are characteristic of 30-49% stenosis.  Summary: Right: 75-99% stenosis noted in the superficial femoral artery. Left: 30-49% stenosis noted in the superficial femoral artery. See Arterial Doppler study.  See table(s) above for measurements and observations. Vascular consult recommended. Electronically signed by Ida Rogue MD on 10/27/2019 at 4:36:05 PM.    Final      ASSESSMENT:  1.  Stage IIIa (T1b N2 cM0) NSCLC, favoring adenocarcinoma: -Chemo XRT with weekly carboplatin and paclitaxel completed on 08/20/2016. -Consolidation durvalumab from 09/26/2016 through 09/25/2017. -Reviewed CT chest on 11/07/2019 which showed increasing size of the upper lobe pulmonary nodules with no new nodule in the left upper lobe suspicious for meta stasis.  Also  increasing confluent soft tissue in the right suprahilar region, thought to be postradiation changes.   PLAN:  1.  Stage IIIa (T1b N2 cM0) NSCLC, favoring adenocarcinoma: -Reviewed CT findings and images with the patient. Nurse recommended PET CT scan for further evaluation.  2.  Hypothyroidism: -Continue Synthroid.  3.  Difficulty  staying asleep: -Continue temazepam.    Orders placed this encounter:  No orders of the defined types were placed in this encounter.    Derek Jack, MD [place] [phone]   I, Milinda Antis, am acting as a scribe for Dr. Sanda Linger.  I, Derek Jack MD, have reviewed the above documentation for accuracy and completeness, and I agree with the above.

## 2019-11-09 NOTE — Patient Instructions (Signed)
[place] at [facility] Discharge Instructions  You were seen today by Dr. Delton Coombes. He went over your recent results and scans. You will be scheduled for a PET scan before your next visit. Dr. Delton Coombes will see you back in 2 weeks for labs and follow up.   Thank you for choosing [place] at [facility] to provide your oncology and hematology care.  To afford each patient quality time with our provider, please arrive at least 15 minutes before your scheduled appointment time.   If you have a lab appointment with the [facility] please come in thru the Main Entrance and check in at the main information desk  You need to re-schedule your appointment should you arrive 10 or more minutes late.  We strive to give you quality time with our providers, and arriving late affects you and other patients whose appointments are after yours.  Also, if you no show three or more times for appointments you may be dismissed from the clinic at the providers discretion.     Again, thank you for choosing [facility].  Our hope is that these requests will decrease the amount of time that you wait before being seen by our physicians.       _____________________________________________________________  Should you have questions after your visit to [facility], please contact our office at [phone] between the hours of 8:00 a.m. and 4:30 p.m.  Voicemails left after 4:00 p.m. will not be returned until the following business day.  For prescription refill requests, have your pharmacy contact our office and allow 72 hours.    Cancer Center Support Programs:   > Cancer Support Group  2nd Tuesday of the month 1pm-2pm, Journey Room

## 2019-11-14 ENCOUNTER — Other Ambulatory Visit: Payer: Self-pay | Admitting: Family

## 2019-11-14 ENCOUNTER — Ambulatory Visit: Payer: Self-pay | Admitting: Pharmacist

## 2019-11-16 ENCOUNTER — Other Ambulatory Visit: Payer: Self-pay | Admitting: Family

## 2019-11-16 ENCOUNTER — Other Ambulatory Visit: Payer: Self-pay

## 2019-11-16 ENCOUNTER — Encounter: Payer: Self-pay | Admitting: Pharmacist

## 2019-11-16 ENCOUNTER — Ambulatory Visit (INDEPENDENT_AMBULATORY_CARE_PROVIDER_SITE_OTHER): Payer: Medicare Other | Admitting: Pharmacist

## 2019-11-16 DIAGNOSIS — E1142 Type 2 diabetes mellitus with diabetic polyneuropathy: Secondary | ICD-10-CM

## 2019-11-16 DIAGNOSIS — E119 Type 2 diabetes mellitus without complications: Secondary | ICD-10-CM

## 2019-11-16 MED ORDER — RYBELSUS 7 MG PO TABS: 7.0000 mg | ORAL_TABLET | ORAL | 2 refills | Status: DC

## 2019-11-16 NOTE — Progress Notes (Signed)
    11/16/2019 Name: John [name] MRN: [id number] DOB: 1945/06/30   S:73 yoM presents for diabetes evaluation, education, and management Patient was referred and last seen by Primary Care Provider on6/8/21.  Insurance coverage/medication affordability:[employer] with medications.  Current diabetes medications include:Rybelsus, metformin  Current hypertension medications include:amlodipine, lisinopril, clonidine Goal 130/80  Current hyperlipidemia medications include:pravastatin    Patient denies hypoglycemic events.   Patient reported dietary habits: Eats 3 meals/day  . Breakfast:cereal  . Lunch: sandwiches  . Dinner:veggies, not a huge meat eater  . Snacks:ice cream, he does monitor sugar intake  Discussed meal planning options and Plate method for healthy eating . Avoid sugary drinks and desserts . Incorporate balanced protein, non starchy veggies, 1 serving of carbohydrate with each meal . Increase water intake . Increase physical activity as able  Patient-reported exercise habits: n/a  O:  Lab Results  Component Value Date   HGBA1C 9.1 (H) 09/13/2019   Lipid Panel     Component Value Date/Time   CHOL 136 04/25/2019 1608   TRIG 175 (H) 04/25/2019 1608   HDL 28 (L) 04/25/2019 1608   CHOLHDL 4.9 04/25/2019 1608   LDLCALC 78 04/25/2019 1608     Home fasting blood sugars: 186  2 hour post-meal/random blood sugars: n/a.    A/P:  DiabetesT2DMcurrently uncontrolled. Patient is able to verbalize appropriate hypoglycemia management plan. Patientisadherent with medication. Patient's BGs have improved since last visit  -IncreaseGLP-1 RYBLESUS 7mg (generic name semaglutide)[facility]  -CONTINUE metformin--patient does not like side effects,will continue but not increase dose  -Extensively discussed pathophysiology of diabetes, recommended lifestyle interventions, dietary effects on blood  sugar control  -Counseled on s/sx of and management of hypoglycemia  -Next A1C anticipated 12/07/19.    Written patient instructions provided.  Total time in face to face counseling 30 minutes.   Follow up PCP Clinic Visit ON 12/07/19   Regina Eck, PharmD, BCPS Clinical Pharmacist, [place]  II Phone [phone]

## 2019-11-18 ENCOUNTER — Encounter: Payer: Self-pay | Admitting: Student

## 2019-11-18 ENCOUNTER — Other Ambulatory Visit: Payer: Self-pay

## 2019-11-18 ENCOUNTER — Ambulatory Visit (INDEPENDENT_AMBULATORY_CARE_PROVIDER_SITE_OTHER): Payer: Medicare Other | Admitting: Student

## 2019-11-18 VITALS — BP 140/80 | HR 111 | Ht 66.0 in | Wt 184.4 lb

## 2019-11-18 DIAGNOSIS — R000 Tachycardia, unspecified: Secondary | ICD-10-CM | POA: Diagnosis not present

## 2019-11-18 DIAGNOSIS — E785 Hyperlipidemia, unspecified: Secondary | ICD-10-CM | POA: Diagnosis not present

## 2019-11-18 DIAGNOSIS — I2510 Atherosclerotic heart disease of native coronary artery without angina pectoris: Secondary | ICD-10-CM

## 2019-11-18 DIAGNOSIS — I739 Peripheral vascular disease, unspecified: Secondary | ICD-10-CM | POA: Diagnosis not present

## 2019-11-18 DIAGNOSIS — I10 Essential (primary) hypertension: Secondary | ICD-10-CM | POA: Diagnosis not present

## 2019-11-18 MED ORDER — METOPROLOL SUCCINATE ER 25 MG PO TB24
12.5000 mg | ORAL_TABLET | Freq: Every day | ORAL | 3 refills | Status: DC
Start: 2019-11-18 — End: 2020-04-17

## 2019-11-18 NOTE — Patient Instructions (Signed)
Medication Instructions:  Your physician has recommended you make the following change in your medication:  1. START: TOPROL XL 12.5 MG DAILY.  *If you need a refill on your cardiac medications before your next appointment, please call your pharmacy*   Lab Work: NONE If you have labs (blood work) drawn today and your tests are completely normal, you will receive your results only by: Marland Kitchen MyChart Message (if you have MyChart) OR . A paper copy in the mail If you have any lab test that is abnormal or we need to change your treatment, we will call you to review the results.   Testing/Procedures: NONE   Follow-Up: At [facility], you and your health needs are our priority.  As part of our continuing mission to provide you with exceptional heart care, we have created designated Provider Care Teams.  These Care Teams include your primary Cardiologist (physician) and Advanced Practice Providers (APPs -  Physician Assistants and Nurse Practitioners) who all work together to provide you with the care you need, when you need it.  We recommend signing up for the patient portal called "MyChart".  Sign up information is provided on this After Visit Summary.  MyChart is used to connect with patients for Virtual Visits (Telemedicine).  Patients are able to view lab/test results, encounter notes, upcoming appointments, etc.  Non-urgent messages can be sent to your provider as well.   To learn more about what you can do with MyChart, go to [email or web address].    Your next appointment:   6 month(s)  The format for your next appointment:   In Person  Provider:   You may see DR. BRANCH or one of the following Engineer, manufacturing Providers on your designated Care Team:    [place], PA-C   Ermalinda Barrios, [place]

## 2019-11-18 NOTE — Progress Notes (Signed)
Cardiology Office Note    Date:  11/19/2019   ID:  John [name], DOB 1945-06-18, MRN [id number]  PCP:  [name], FNP  Cardiologist: [name], MD (Inactive)    Chief Complaint  Patient presents with  . Follow-up    2 month visit    History of Present Illness:    John [name] is a 74 y.o. male with past medical history of CAD (coronary calcifications by prior CT with NST in 2019 showing scar versus soft tissue with no ischemia), Type 2 DM, HTN, HLD, NSCLC, COPD, OSA (on CPAP) and tobacco use who presents to the office today for 3-monthfollow-up.  He was last examined myself in 09/2019 and denied any recent chest pain or palpitations at that time. He did have baseline dyspnea on exertion but this has been stable and he had been utilizing his CPAP at night. He did report pain along his lower extremities with walking and lower extremity ABI's and Doppler studies were recommended for further evaluation. His lower extremity ABI's and Doppler showed moderate plaque along his right lower extremity with 75-99% stenosis along the superficial femoral artery and less than 50% stenosis along the left. He was referred to Dr. BGwenlyn Foundfor further management of his PAD.   In talking with the patient today, he reports overall doing well since his last visit. He does have baseline sinus tachycardia and questions if his heart rate is contributing to his dyspnea. He reports having chronic dyspnea on exertion due to COPD as well. He does report good compliance with the CPAP at night and denies any recent orthopnea or PND. No recent chest pain or palpitations.  He continues to have pain along his right leg which is worse with activity and improves with rest. He is scheduled to see Dr. BGwenlyn Foundnext month.    Past Medical History:  Diagnosis Date  . Adenocarcinoma of lung, right ([facility] 04/18/2016  . Anxiety   . Arthritis   . Asthma   . CHF (congestive heart failure) ([facility]   . COPD (chronic  obstructive pulmonary disease) ([facility] [place]   . Depression   . Diabetes mellitus without complication (HCC)    no meds  . DM (diabetes mellitus) ([facility] 07/09/2016  . Dyspnea   . History of kidney stones   . Hyperlipidemia   . Hypertension   . Macular degeneration   . Neuropathy   . Non-small cell lung cancer, right ([facility] 04/18/2016  . Prostatitis   . Pulmonary nodule, left 07/16/2016  . Sleep apnea    cpap    Past Surgical History:  Procedure Laterality Date  . CATARACT EXTRACTION, BILATERAL Bilateral   . NO PAST SURGERIES    . PORTACATH PLACEMENT Left 06/13/2016   Procedure: INSERTION PORT-A-CATH;  Surgeon: MAviva Signs MD;  Location: [facility];  Service: General;  Laterality: Left;  .Marland KitchenVIDEO BRONCHOSCOPY WITH ENDOBRONCHIAL NAVIGATION N/A 05/28/2016   Procedure: VIDEO BRONCHOSCOPY WITH ENDOBRONCHIAL NAVIGATION;  Surgeon: SMelrose Nakayama MD;  [facility]  Service: Thoracic;  Laterality: N/A;  . VIDEO BRONCHOSCOPY WITH ENDOBRONCHIAL ULTRASOUND N/A 05/28/2016   Procedure: VIDEO BRONCHOSCOPY WITH ENDOBRONCHIAL ULTRASOUND;  Surgeon: SMelrose Nakayama MD;  Location: [facility];  Service: Thoracic;  Laterality: N/A;    Current Medications: Outpatient Medications Prior to Visit  Medication Sig Dispense Refill  . albuterol (VENTOLIN HFA) 108 (90 Base) MCG/ACT inhaler USE 2 PUFF BY MOUTH EVERY 6 HOURS AS NEEDED FOR SHORTNESS OF BREATH OR WHEEZING 18 g 3  .  amLODipine (NORVASC) 5 MG tablet Take 1 tablet (5 mg total) by mouth daily. 90 tablet 3  . aspirin EC 81 MG tablet Take 81 mg by mouth daily.    . Blood Glucose Monitoring Suppl (ONETOUCH VERIO REFLECT) w/Device KIT Use to test blood sugars daily as directed. DX: E11.9 1 kit 1  . cloNIDine (CATAPRES) 0.1 MG tablet TAKE 1 TABLET (0.1 MG TOTAL) BY MOUTH 3 (THREE) TIMES DAILY. 270 tablet 1  . dutasteride (AVODART) 0.5 MG capsule TAKE 1 CAPSULE BY MOUTH EVERY DAY 90 capsule 0  . fluticasone (FLONASE) 50 MCG/ACT nasal spray Place 2 sprays into both  nostrils daily. 16 g 6  . Fluticasone-Umeclidin-Vilant (TRELEGY ELLIPTA) 100-62.5-25 MCG/INH AEPB One click each am 1 each 11  . furosemide (LASIX) 20 MG tablet Take 1 tablet (20 mg total) by mouth 2 (two) times daily. 180 tablet 1  . gabapentin (NEURONTIN) 300 MG capsule TAKE 1 CAPSULE BY MOUTH IN THE MORNING AND 2 CAPSULES AT NIGHT 270 capsule 0  . glucose blood (ONETOUCH VERIO) test strip Use to test blood sugars daily as directed. DX: E11.9 100 each 12  . levothyroxine (SYNTHROID) 75 MCG tablet Take 1 tablet (75 mcg total) by mouth daily. 90 tablet 3  . meloxicam (MOBIC) 7.5 MG tablet Take 1 tablet (7.5 mg total) by mouth daily as needed for pain (back pain). 90 tablet 1  . metFORMIN (GLUCOPHAGE-XR) 500 MG 24 hr tablet TAKE 1 TABLET BY MOUTH EVERY DAY WITH BREAKFAST 30 tablet 4  . olmesartan (BENICAR) 40 MG tablet Take 1 tablet (40 mg total) by mouth daily. 30 tablet 11  . OneTouch Delica Lancets 31S MISC Use to test blood sugars daily as directed. DX: E11.9 100 each 1  . pravastatin (PRAVACHOL) 40 MG tablet Take 1 tablet (40 mg total) by mouth every 3 (three) days. M-W-F 90 tablet 1  . prochlorperazine (COMPAZINE) 10 MG tablet Take 1 tablet (10 mg total) by mouth every 6 (six) hours as needed for nausea or vomiting. 60 tablet 2  . Semaglutide (RYBELSUS) 7 MG TABS Take 7 mg by mouth every morning. 90 tablet 2  . tamsulosin (FLOMAX) 0.4 MG CAPS capsule TAKE 2 CAPSULES BY MOUTH EVERY DAY 180 capsule 1  . temazepam (RESTORIL) 15 MG capsule Take 15 mg by mouth at bedtime as needed for sleep.    Marland Kitchen zolpidem (AMBIEN) 5 MG tablet TAKE 1 TABLET (5 MG TOTAL) BY MOUTH AT BEDTIME AS NEEDED FOR SLEEP. 30 tablet 0   No facility-administered medications prior to visit.     Allergies:   Jardiance [empagliflozin] and Lopressor [metoprolol]   Social History   Socioeconomic History  . Marital status: Divorced    Spouse name: Not on file  . Number of children: 5  . Years of education: 10  . Highest  education level: 10th grade  Occupational History  . Occupation: Retired  Tobacco Use  . Smoking status: Current Every Day Smoker    Packs/day: 0.50    Years: 55.00    Pack years: 27.50    Types: Cigarettes    Start date: 03/11/1961  . Smokeless tobacco: Never Used  . Tobacco comment: half pack to pack a day just depends on what he is doing  Vaping Use  . Vaping Use: Never used  Substance and Sexual Activity  . Alcohol use: No  . Drug use: No  . Sexual activity: Yes    Birth control/protection: None  Other Topics Concern  . Not  on file  Social History Narrative  . Not on file   Social Determinants of Health   Financial Resource Strain: Low Risk   . Difficulty of Paying Living Expenses: Not hard at all  Food Insecurity: No Food Insecurity  . Worried About Charity fundraiser in the Last Year: Never true  . Ran Out of Food in the Last Year: Never true  Transportation Needs: No Transportation Needs  . Lack of Transportation (Medical): No  . Lack of Transportation (Non-Medical): No  Physical Activity: Inactive  . Days of Exercise per Week: 0 days  . Minutes of Exercise per Session: 0 min  Stress: No Stress Concern Present  . Feeling of Stress : Not at all  Social Connections: Moderately Isolated  . Frequency of Communication with Friends and Family: More than three times a week  . Frequency of Social Gatherings with Friends and Family: More than three times a week  . Attends Religious Services: Never  . Active Member of Clubs or Organizations: No  . Attends Archivist Meetings: Never  . Marital Status: Living with partner     Family History:  The patient's family history includes Diabetes in his father; Heart disease in his father; Hypertension in his mother and sister; Stroke in his father.   Review of Systems:   Please see the history of present illness.     General:  No chills, fever, night sweats or weight changes.  Cardiovascular:  No chest pain, edema,  orthopnea, palpitations, paroxysmal nocturnal dyspnea. Positive for dyspnea on exertion and claudication.  Dermatological: No rash, lesions/masses Respiratory: No cough, dyspnea Urologic: No hematuria, dysuria Abdominal:   No nausea, vomiting, diarrhea, bright red blood per rectum, melena, or hematemesis Neurologic:  No visual changes, wkns, changes in mental status. All other systems reviewed and are otherwise negative except as noted above.   Physical Exam:    VS:  BP 140/80   Pulse (!) 111   Ht _0  (1.676 m)   Wt 184 lb 6.4 oz (83.6 kg)   BMI 29.76 kg/m    General: Well developed, well nourished,male appearing in no acute distress. Head: Normocephalic, atraumatic. Neck: No carotid bruits. JVD not elevated.  Lungs: Respirations regular and unlabored, without wheezes or rales.  Heart: Regular rhythm, tachycardiac rate. No S3 or S4.  No murmur, no rubs, or gallops appreciated. Abdomen: Appears non-distended. No obvious abdominal masses. Msk:  Strength and tone appear normal for age. No obvious joint deformities or effusions. Extremities: No clubbing or cyanosis. No edema.  Distal pedal pulses are 1+ bilaterally. Neuro: Alert and oriented X 3. Moves all extremities spontaneously. No focal deficits noted. Psych:  Responds to questions appropriately with a normal affect. Skin: No rashes or lesions noted  Wt Readings from Last 3 Encounters:  11/18/19 184 lb 6.4 oz (83.6 kg)  11/09/19 182 lb 12.8 oz (82.9 kg)  10/24/19 182 lb (82.6 kg)    Studies/Labs Reviewed:   EKG:  EKG is not ordered today.    Recent Labs: 06/28/2019: TSH 1.066 11/07/2019: ALT 19; BUN 15; Creatinine, Ser 0.75; Hemoglobin 15.2; Platelets 276; Potassium 3.8; Sodium 137   Lipid Panel    Component Value Date/Time   CHOL 136 04/25/2019 1608   TRIG 175 (H) 04/25/2019 1608   HDL 28 (L) 04/25/2019 1608   CHOLHDL 4.9 04/25/2019 1608   LDLCALC 78 04/25/2019 1608    Additional studies/ records that were  reviewed today include:   Echocardiogram: 01/2018 Study  Conclusions   - Left ventricle: The cavity size was normal. Wall thickness was  normal. Systolic function was normal. The estimated ejection  fraction was in the range of 60% to 65%. Although no diagnostic  regional wall motion abnormality was identified, this possibility  cannot be completely excluded on the basis of this study.  Indeterminate diastolic function.  - Aortic valve: Mildly calcified annulus. Probably trileaflet;  moderately calcified leaflets.  - Mitral valve: Moderately calcified annulus. There was trivial  regurgitation.  - Right ventricle: Systolic function was mildly reduced.  - Right atrium: Central venous pressure (est): 8 mm Hg.  - Tricuspid valve: There was trivial regurgitation.  - Pulmonary arteries: Systolic pressure could not be accurately  estimated.  - Pericardium, extracardiac: A prominent pericardial fat pad was  present. Cannot exclude small anteroapical pericardial effusion  with signs of chronicity.   NST: 03/2018  No diagnostic ST segment changes to indicate ischemia.  Medium sized, severe intensity, inferior defect extending from apex to base with inferior septal defect at the base. These regions are fixed and suggestive of scar versus soft tissue attenuation.  This is an intermediate risk study.  Nuclear stress EF: 48%. Visually, LVEF appears normal, consider echocardiogram if not done recently.  Assessment:    1. Coronary artery calcification seen on CT scan   2. Sinus tachycardia   3. PAD (peripheral artery disease) ([place])   4. Essential hypertension   5. Hyperlipidemia LDL goal <70      Plan:   In order of problems listed above:  1. CAD - He did have prior coronary calcifications by prior CT with NST in 2019 showing scar versus soft tissue with no ischemia. He denies any recent chest pain or dyspnea on exertion.  - Continue ASA and statin therapy. Will  start Toprol-XL 12.103m daily as outlined below.   2. Sinus Tachycardia - HR has been in the 110's when checked at home and recent EKG's have demonstrated sinus tachycardia. Will start Toprol-XL 12.545mdaily to see if this helps with his HR and dyspnea. Can further titrate to 2529maily pending HR and BP. He was previously intolerant to higher dosing and had fatigue with Toprol-XL 51m78mily in the past.   3. PAD - Recent dopplers showed moderate plaque along his right lower extremity with 75-99% stenosis along the superficial femoral artery and less than 50% stenosis along the left. He does have follow-up with Dr. BerrGwenlyn Foundeduled for next month.  - Continue ASA and statin therapy.   4. HTN - BP is slightly elevated at 140/80 during today's visit. Remains on Amlodipine 5mg 43mly, Clonidine 0.1mg T32m and Benicar 40mg d81m. Will add Toprol-XL as outlined above which should help with his HR and BP.   5. HLD - LDL was at 78 in 04/2019. He remains on Pravastatin 40mg da71m   Medication Adjustments/Labs and Tests Ordered: Current medicines are reviewed at length with the patient today.  Concerns regarding medicines are outlined above.  Medication changes, Labs and Tests ordered today are listed in the Patient Instructions below. Patient Instructions  Medication Instructions:  Your physician has recommended you make the following change in your medication:  1. START: TOPROL XL 12.5 MG DAILY.  *If you need a refill on your cardiac medications before your next appointment, please call your pharmacy*   Lab Work: NONE If you have labs (blood work) drawn today and your tests are completely normal, you will receive your results only by: . MyCharMarland Kitchen Message (  if you have MyChart) OR . A paper copy in the mail If you have any lab test that is abnormal or we need to change your treatment, we will call you to review the results.   Testing/Procedures: NONE   Follow-Up: At [facility], you and  your health needs are our priority.  As part of our continuing mission to provide you with exceptional heart care, we have created designated Provider Care Teams.  These Care Teams include your primary Cardiologist (physician) and Advanced Practice Providers (APPs -  Physician Assistants and Nurse Practitioners) who all work together to provide you with the care you need, when you need it.  We recommend signing up for the patient portal called "MyChart".  Sign up information is provided on this After Visit Summary.  MyChart is used to connect with patients for Virtual Visits (Telemedicine).  Patients are able to view lab/test results, encounter notes, upcoming appointments, etc.  Non-urgent messages can be sent to your provider as well.   To learn more about what you can do with MyChart, go to [email or web address].    Your next appointment:   6 month(s)  The format for your next appointment:   In Person  Provider:   You may see DR. BRANCH or one of the following Engineer, manufacturing Providers on your designated Care Team:    Bernerd Pho, PA-C   Ermalinda Barrios, PA-C      Signed, Erma Heritage, [place]  11/19/2019 10:15 AM    [place]. [address] [place], [place] [address] Phone: [phone] Fax: [phone]

## 2019-11-19 ENCOUNTER — Encounter: Payer: Self-pay | Admitting: Student

## 2019-11-21 ENCOUNTER — Encounter (HOSPITAL_COMMUNITY)
Admission: RE | Admit: 2019-11-21 | Discharge: 2019-11-21 | Disposition: A | Discharge status: Home / Self Care | Payer: Medicare Other | Source: Ambulatory Visit | Attending: Hematology | Admitting: Hematology

## 2019-11-21 ENCOUNTER — Other Ambulatory Visit: Payer: Self-pay

## 2019-11-21 DIAGNOSIS — C3491 Malignant neoplasm of unspecified part of right bronchus or lung: Secondary | ICD-10-CM | POA: Insufficient documentation

## 2019-11-21 DIAGNOSIS — I2510 Atherosclerotic heart disease of native coronary artery without angina pectoris: Secondary | ICD-10-CM | POA: Diagnosis not present

## 2019-11-21 DIAGNOSIS — I700 Atherosclerosis of aorta: Secondary | ICD-10-CM | POA: Diagnosis not present

## 2019-11-21 DIAGNOSIS — Z923 Personal history of irradiation: Secondary | ICD-10-CM | POA: Insufficient documentation

## 2019-11-21 DIAGNOSIS — C3490 Malignant neoplasm of unspecified part of unspecified bronchus or lung: Secondary | ICD-10-CM | POA: Diagnosis not present

## 2019-11-21 DIAGNOSIS — K5730 Diverticulosis of large intestine without perforation or abscess without bleeding: Secondary | ICD-10-CM | POA: Diagnosis not present

## 2019-11-21 MED ORDER — FLUDEOXYGLUCOSE F - 18 (FDG) INJECTION
10.4000 | Freq: Once | INTRAVENOUS | Status: AC | PRN
  Administered 2019-11-21: 10.24 via INTRAVENOUS

## 2019-11-24 ENCOUNTER — Inpatient Hospital Stay (HOSPITAL_BASED_OUTPATIENT_CLINIC_OR_DEPARTMENT_OTHER): Payer: Medicare Other | Admitting: Hematology

## 2019-11-24 ENCOUNTER — Other Ambulatory Visit: Payer: Self-pay

## 2019-11-24 ENCOUNTER — Inpatient Hospital Stay (HOSPITAL_COMMUNITY): Payer: Medicare Other

## 2019-11-24 VITALS — BP 130/64 | HR 110 | Temp 97.5°F | Resp 18 | Wt 183.2 lb

## 2019-11-24 DIAGNOSIS — J449 Chronic obstructive pulmonary disease, unspecified: Secondary | ICD-10-CM | POA: Diagnosis not present

## 2019-11-24 DIAGNOSIS — C3411 Malignant neoplasm of upper lobe, right bronchus or lung: Secondary | ICD-10-CM | POA: Diagnosis not present

## 2019-11-24 DIAGNOSIS — C3491 Malignant neoplasm of unspecified part of right bronchus or lung: Secondary | ICD-10-CM

## 2019-11-24 DIAGNOSIS — E039 Hypothyroidism, unspecified: Secondary | ICD-10-CM | POA: Diagnosis not present

## 2019-11-24 DIAGNOSIS — I2510 Atherosclerotic heart disease of native coronary artery without angina pectoris: Secondary | ICD-10-CM | POA: Diagnosis not present

## 2019-11-24 DIAGNOSIS — I700 Atherosclerosis of aorta: Secondary | ICD-10-CM | POA: Diagnosis not present

## 2019-11-24 DIAGNOSIS — I509 Heart failure, unspecified: Secondary | ICD-10-CM | POA: Diagnosis not present

## 2019-11-24 LAB — COMPREHENSIVE METABOLIC PANEL
ALT: 15 U/L (ref 0–44)
AST: 17 U/L (ref 15–41)
Albumin: 3.8 g/dL (ref 3.5–5.0)
Alkaline Phosphatase: 96 U/L (ref 38–126)
Anion gap: 12 (ref 5–15)
BUN: 13 mg/dL (ref 8–23)
CO2: 25 mmol/L (ref 22–32)
Calcium: 9.3 mg/dL (ref 8.9–10.3)
Chloride: 101 mmol/L (ref 98–111)
Creatinine, Ser: 0.75 mg/dL (ref 0.61–1.24)
GFR calc Af Amer: >60 mL/min (ref >60)
GFR calc non Af Amer: >60 mL/min (ref >60)
Glucose, Bld: 205 mg/dL — ABNORMAL HIGH (ref 70–99)
Potassium: 4.1 mmol/L (ref 3.5–5.1)
Sodium: 138 mmol/L (ref 135–145)
Total Bilirubin: 0.2 mg/dL — ABNORMAL LOW (ref 0.3–1.2)
Total Protein: 7.4 g/dL (ref 6.5–8.1)

## 2019-11-24 LAB — CBC WITH DIFFERENTIAL/PLATELET
Abs Immature Granulocytes: 0.03 10*3/uL (ref 0.00–0.07)
Basophils Absolute: 0 10*3/uL (ref 0.0–0.1)
Basophils Relative: 1 %
Eosinophils Absolute: 0.2 10*3/uL (ref 0.0–0.5)
Eosinophils Relative: 2 %
HCT: 43.5 % (ref 39.0–52.0)
Hemoglobin: 14.1 g/dL (ref 13.0–17.0)
Immature Granulocytes: 0 %
Lymphocytes Relative: 9 %
Lymphs Abs: 0.7 10*3/uL (ref 0.7–4.0)
MCH: 32.5 pg (ref 26.0–34.0)
MCHC: 32.4 g/dL (ref 30.0–36.0)
MCV: 100.2 fL — ABNORMAL HIGH (ref 80.0–100.0)
Monocytes Absolute: 0.9 10*3/uL (ref 0.1–1.0)
Monocytes Relative: 11 %
Neutro Abs: 5.9 10*3/uL (ref 1.7–7.7)
Neutrophils Relative %: 77 %
Platelets: 261 10*3/uL (ref 150–400)
RBC: 4.34 MIL/uL (ref 4.22–5.81)
RDW: 14.2 % (ref 11.5–15.5)
WBC: 7.6 10*3/uL (ref 4.0–10.5)
nRBC: 0 % (ref 0.0–0.2)

## 2019-11-24 LAB — TSH: TSH: 1.706 u[IU]/mL (ref 0.350–4.500)

## 2019-11-24 NOTE — Patient Instructions (Signed)
[place] at [facility] Discharge Instructions  You were seen today by Dr. Delton Coombes. He went over your recent results. You will be scheduled for a CT scan of your chest before your next visit. Dr. Delton Coombes will see you back in 4 months for follow up.   Thank you for choosing [name] at [facility] to provide your oncology and hematology care.  To afford each patient quality time with our provider, please arrive at least 15 minutes before your scheduled appointment time.   If you have a lab appointment with the [facility] please come in thru the Main Entrance and check in at the main information desk  You need to re-schedule your appointment should you arrive 10 or more minutes late.  We strive to give you quality time with our providers, and arriving late affects you and other patients whose appointments are after yours.  Also, if you no show three or more times for appointments you may be dismissed from the clinic at the providers discretion.     Again, thank you for choosing [facility].  Our hope is that these requests will decrease the amount of time that you wait before being seen by our physicians.       _____________________________________________________________  Should you have questions after your visit to [facility], please contact our office at [phone] between the hours of 8:00 a.m. and 4:30 p.m.  Voicemails left after 4:00 p.m. will not be returned until the following business day.  For prescription refill requests, have your pharmacy contact our office and allow 72 hours.    Cancer Center Support Programs:   > Cancer Support Group  2nd Tuesday of the month 1pm-2pm, Journey Room

## 2019-11-24 NOTE — Progress Notes (Signed)
[name], [name] [address]   CLINIC:  Medical Oncology/Hematology  PCP:  [name], [place] / [place] [address] [phone]   REASON FOR VISIT:  Follow-up for NSCLC  PRIOR THERAPY:  1. Chemoradiation with weekly carboplatin and paclitaxel from 06/25/2016 to 08/20/2016. 2. Consolidation durvalumab from 09/26/2016 to 09/25/2017.  NGS Results: Not done  CURRENT THERAPY: Surveillance  BRIEF ONCOLOGIC HISTORY:  Oncology History  Non-small cell lung cancer, right ([place])  04/17/2016 Imaging   CT chest- 1. Examination is positive for bilateral upper lobe pulmonary lesions suspicious for malignancy. Further investigation with PET-CT and tissue sampling is advised. 2. Borderline enlarged paratracheal lymph nodes and right hilar nodes. Cannot rule out metastatic adenopathy. Attention to these areas on PET-CT is advised. 3. Emphysema 4. Aortic atherosclerosis and multi vessel coronary artery calcification.   04/18/2016 Initial Diagnosis   Adenocarcinoma of lung, right ([place])   04/24/2016 PET scan   1. Adjacent hypermetabolic nodules in the RIGHT upper lobe consistent bronchogenic carcinoma. 2. Suspicion of ipsilateral hilar and paratracheal nodal metastasis. 3. Nodule in the LEFT upper lobe with suspicious morphology has a relatively low metabolic activity for size. Favor synchronous primary bronchogenic carcinoma. 4. No metabolic activity associated with a small LEFT lower lobe pulmonary nodule. Recommend attention on follow-up.   05/06/2016 Imaging   MRI brain- Negative for metastatic disease.   05/28/2016 Procedure   1. Electromagnetic navigational bronchoscopy with brushings, needle     aspirations and biopsies of bilateral upper lobe nodules. 2. Endobronchial ultrasound with mediastinal and hilar lymph node     aspirations. By Dr. [name]   06/03/2016 Pathology Results   Diagnosis 1. Lung, biopsy,  Right upper lobe - BLOOD AND BENIGN BRONCHIAL AND LUNG TISSUE. 2. Lung, biopsy, Left upper lobe - BLOOD AND BENIGN BRONCHIAL AND LUNG TISSUE.   06/03/2016 Pathology Results   Diagnosis TRANSBRONCHIAL NEEDLE ASPIRATION, NAVIGATION, RUL (SPECIMEN 5 OF 7, COLLECTED 05/28/16): MALIGNANT CELLS CONSISTENT WITH NON-SMALL CELL CARCINOMA Comment There are limited tumor cells in the cell block (insufficient for molecular testing). TTF-1 is positive in a few of the atypical cells. They appear negative for synaptophysin and cytokeratin 5/6. Overall there is limited tumor, but a lung adenocarcinoma is slightly favored. Dr. [name] has reviewed the case. The case was called to Dr. [name] on 06/03/2016.   06/13/2016 Procedure   Port placed by Dr. Arnoldo Morale   06/25/2016 - 08/15/2016 Chemotherapy   The patient had palonosetron (ALOXI) injection 0.25 mg, 0.25 mg, Intravenous,  Once, 6 of 6 cycles  CARBOplatin (PARAPLATIN) 220 mg in sodium chloride 0.9 % 250 mL chemo infusion, 220 mg (100 % of original dose 215.8 mg), Intravenous,  Once, 6 of 6 cycles Dose modification:   (original dose 215.8 mg, Cycle 1),   (original dose 215.8 mg, Cycle 5),   (original dose 215.8 mg, Cycle 6),   (original dose 215.8 mg, Cycle 2),   (original dose 215.8 mg, Cycle 3),   (original dose 215.8 mg, Cycle 4)  PACLitaxel (TAXOL) 90 mg in dextrose 5 % 250 mL chemo infusion ( for chemotherapy treatment.     06/25/2016 - 08/20/2016 Radiation Therapy   Radiation in [place], [place] by Dr. Quitman Livings.  R lung to 6600 cGy   07/09/2016 - 07/10/2016 Hospital Admission   Admit date: 07/09/2016  Admission diagnosis: Fever and chills Additional comments: Negative work-up, discharged with course of Levaquin   07/09/2016 Treatment Plan Change   Treatment  deferred x 1 week due to hospitalization    08/05/2016 - 08/06/2016 Hospital Admission   Admit date: 08/05/2016 Admission diagnosis: Joint pain Additional comments: Treated with steroids with symptomatic  improvement   08/25/2016 Imaging   MRI brain- Negative for intracranial metastasis on this motion degraded examination.  Stable mild-to-moderate chronic small vessel ischemic disease.   09/18/2016 PET scan   1. Partial metabolic response. Right upper lobe 2.6 cm hypermetabolic pulmonary nodule, decreased in size and metabolism. Mildly hypermetabolic right hilar nodal metastasis, decreased in metabolism. Right paratracheal lymph node is no longer hypermetabolic. No new or progressive metastatic disease. 2. Apical left upper lobe 0.9 cm solid pulmonary nodule, slightly decreased in size, with no significant metabolism. The change in size could indicate a neoplastic etiology for this nodule. 3. Additional subcentimeter pulmonary nodules in the left lung are stable and below PET resolution . 4. Additional findings include aortic atherosclerosis, coronary atherosclerosis, moderate emphysema, stable small pericardial effusion/thickening and mild sigmoid diverticulosis.   01/13/2017 Imaging   CT chest with contrast IMPRESSION: 1. No change in size of the hypermetabolic nodular thickening in the RIGHT upper lobe. 2. LEFT upper lobe spiculated nodule is stable. 3. Rounded LEFT lower lobe pulmonary nodule is stable. 4. No evidence of lung cancer progression.   05/08/2017 Adverse Reaction   Development of Hypothyroidism- likely secondary to immunotherapy.  Levothyroxine therapy initiated.      CANCER STAGING: Cancer Staging Non-small cell lung cancer, right [facility]) Staging form: Lung, AJCC 8th Edition - Clinical stage from 06/05/2016: Stage IIIA (cT1b(2), cN2, cM0) - Signed by Baird Cancer, PA-C on 08/21/2016   INTERVAL HISTORY:  [name], a 74 y.o. male, returns for routine follow-up of his NSCLC. Graysyn was last seen on 11/09/2019.   Today he reports feeling well and his cough is stable. He still gets SOB with exertion.  He will be getting a stent in his leg due to  blockage.   REVIEW OF SYSTEMS:  Review of Systems  Constitutional: Positive for fatigue (moderate). Negative for appetite change.  Respiratory: Positive for cough and shortness of breath (w/ exertion).   Gastrointestinal: Positive for constipation, diarrhea and nausea.  Neurological: Positive for numbness (hands & feet).  All other systems reviewed and are negative.   PAST MEDICAL/SURGICAL HISTORY:  Past Medical History:  Diagnosis Date  . Adenocarcinoma of lung, right ([place]) 04/18/2016  . Anxiety   . Arthritis   . Asthma   . CHF (congestive heart failure) ([place])   . COPD (chronic obstructive pulmonary disease) ([place])   . Depression   . Diabetes mellitus without complication ([facility])    no meds  . DM (diabetes mellitus) ([place]) 07/09/2016  . Dyspnea   . History of kidney stones   . Hyperlipidemia   . Hypertension   . Macular degeneration   . Neuropathy   . Non-small cell lung cancer, right ([place]) 04/18/2016  . Prostatitis   . Pulmonary nodule, left 07/16/2016  . Sleep apnea    cpap   Past Surgical History:  Procedure Laterality Date  . CATARACT EXTRACTION, BILATERAL Bilateral   . NO PAST SURGERIES    . PORTACATH PLACEMENT Left 06/13/2016   Procedure: INSERTION PORT-A-CATH;  Surgeon: Aviva Signs, MD;  Location: [facility];  Service: General;  Laterality: Left;  Marland Kitchen VIDEO BRONCHOSCOPY WITH ENDOBRONCHIAL NAVIGATION N/A 05/28/2016   Procedure: VIDEO BRONCHOSCOPY WITH ENDOBRONCHIAL NAVIGATION;  Surgeon: [name], MD;  Location: [facility];  Service: Thoracic;  Laterality: N/A;  .  VIDEO BRONCHOSCOPY WITH ENDOBRONCHIAL ULTRASOUND N/A 05/28/2016   Procedure: VIDEO BRONCHOSCOPY WITH ENDOBRONCHIAL ULTRASOUND;  Surgeon: [name], MD;  Location: [facility];  Service: Thoracic;  Laterality: N/A;    SOCIAL HISTORY:  Social History   Socioeconomic History  . Marital status: Divorced    Spouse name: Not on file  . Number of children: 5  . Years of education: 10  . Highest education  level: 10th grade  Occupational History  . Occupation: Retired  Tobacco Use  . Smoking status: Current Every Day Smoker    Packs/day: 0.50    Years: 55.00    Pack years: 27.50    Types: Cigarettes    Start date: 03/11/1961  . Smokeless tobacco: Never Used  . Tobacco comment: half pack to pack a day just depends on what he is doing  Vaping Use  . Vaping Use: Never used  Substance and Sexual Activity  . Alcohol use: No  . Drug use: No  . Sexual activity: Yes    Birth control/protection: None  Other Topics Concern  . Not on file  Social History Narrative  . Not on file   Social Determinants of Health   Financial Resource Strain: Low Risk   . Difficulty of Paying Living Expenses: Not hard at all  Food Insecurity: No Food Insecurity  . Worried About Charity fundraiser in the Last Year: Never true  . Ran Out of Food in the Last Year: Never true  Transportation Needs: No Transportation Needs  . Lack of Transportation (Medical): No  . Lack of Transportation (Non-Medical): No  Physical Activity: Inactive  . Days of Exercise per Week: 0 days  . Minutes of Exercise per Session: 0 min  Stress: No Stress Concern Present  . Feeling of Stress : Not at all  Social Connections: Moderately Isolated  . Frequency of Communication with Friends and Family: More than three times a week  . Frequency of Social Gatherings with Friends and Family: More than three times a week  . Attends Religious Services: Never  . Active Member of Clubs or Organizations: No  . Attends Archivist Meetings: Never  . Marital Status: Living with partner  Intimate Partner Violence: Not At Risk  . Fear of Current or Ex-Partner: No  . Emotionally Abused: No  . Physically Abused: No  . Sexually Abused: No    FAMILY HISTORY:  Family History  Problem Relation Age of Onset  . Hypertension Mother   . Diabetes Father   . Heart disease Father   . Stroke Father   . Hypertension Sister     CURRENT  MEDICATIONS:  Current Outpatient Medications  Medication Sig Dispense Refill  . albuterol (VENTOLIN HFA) 108 (90 Base) MCG/ACT inhaler USE 2 PUFF BY MOUTH EVERY 6 HOURS AS NEEDED FOR SHORTNESS OF BREATH OR WHEEZING 18 g 3  . amLODipine (NORVASC) 5 MG tablet Take 1 tablet (5 mg total) by mouth daily. 90 tablet 3  . aspirin EC 81 MG tablet Take 81 mg by mouth daily.    . Blood Glucose Monitoring Suppl (ONETOUCH VERIO REFLECT) w/Device KIT Use to test blood sugars daily as directed. DX: E11.9 1 kit 1  . cloNIDine (CATAPRES) 0.1 MG tablet TAKE 1 TABLET (0.1 MG TOTAL) BY MOUTH 3 (THREE) TIMES DAILY. 270 tablet 1  . dutasteride (AVODART) 0.5 MG capsule TAKE 1 CAPSULE BY MOUTH EVERY DAY 90 capsule 0  . fluticasone (FLONASE) 50 MCG/ACT nasal spray Place 2 sprays into  both nostrils daily. 16 g 6  . Fluticasone-Umeclidin-Vilant (TRELEGY ELLIPTA) 100-62.5-25 MCG/INH AEPB One click each am 1 each 11  . furosemide (LASIX) 20 MG tablet Take 1 tablet (20 mg total) by mouth 2 (two) times daily. 180 tablet 1  . gabapentin (NEURONTIN) 300 MG capsule TAKE 1 CAPSULE BY MOUTH IN THE MORNING AND 2 CAPSULES AT NIGHT 270 capsule 0  . glucose blood (ONETOUCH VERIO) test strip Use to test blood sugars daily as directed. DX: E11.9 100 each 12  . levothyroxine (SYNTHROID) 75 MCG tablet Take 1 tablet (75 mcg total) by mouth daily. 90 tablet 3  . meloxicam (MOBIC) 7.5 MG tablet Take 1 tablet (7.5 mg total) by mouth daily as needed for pain (back pain). 90 tablet 1  . metFORMIN (GLUCOPHAGE-XR) 500 MG 24 hr tablet TAKE 1 TABLET BY MOUTH EVERY DAY WITH BREAKFAST 30 tablet 4  . metoprolol succinate (TOPROL XL) 25 MG 24 hr tablet Take 0.5 tablets (12.5 mg total) by mouth daily. 45 tablet 3  . olmesartan (BENICAR) 40 MG tablet Take 1 tablet (40 mg total) by mouth daily. 30 tablet 11  . OneTouch Delica Lancets 01B MISC Use to test blood sugars daily as directed. DX: E11.9 100 each 1  . pravastatin (PRAVACHOL) 40 MG tablet Take 1  tablet (40 mg total) by mouth every 3 (three) days. M-W-F 90 tablet 1  . prochlorperazine (COMPAZINE) 10 MG tablet Take 1 tablet (10 mg total) by mouth every 6 (six) hours as needed for nausea or vomiting. 60 tablet 2  . Semaglutide (RYBELSUS) 7 MG TABS Take 7 mg by mouth every morning. 90 tablet 2  . tamsulosin (FLOMAX) 0.4 MG CAPS capsule TAKE 2 CAPSULES BY MOUTH EVERY DAY 180 capsule 1  . temazepam (RESTORIL) 15 MG capsule Take 15 mg by mouth at bedtime as needed for sleep.    Marland Kitchen zolpidem (AMBIEN) 5 MG tablet TAKE 1 TABLET (5 MG TOTAL) BY MOUTH AT BEDTIME AS NEEDED FOR SLEEP. 30 tablet 0   No current facility-administered medications for this visit.    ALLERGIES:  Allergies  Allergen Reactions  . Jardiance [Empagliflozin]     FEELS SLUGGISH, TIRED  . Lopressor [Metoprolol]     Fatigue    PHYSICAL EXAM:  Performance status (ECOG): 1 - Symptomatic but completely ambulatory  Vitals:   11/24/19 1048  BP: 130/64  Pulse: (!) 110  Resp: 18  Temp: (!) 97.5 F (36.4 C)  SpO2: 94%   Wt Readings from Last 3 Encounters:  11/24/19 183 lb 3.2 oz (83.1 kg)  11/18/19 184 lb 6.4 oz (83.6 kg)  11/09/19 182 lb 12.8 oz (82.9 kg)   Physical Exam Vitals reviewed.  Constitutional:      Appearance: Normal appearance.  Cardiovascular:     Rate and Rhythm: Normal rate and regular rhythm.     Pulses: Normal pulses.     Heart sounds: Normal heart sounds.  Pulmonary:     Effort: Pulmonary effort is normal.     Breath sounds: Decreased breath sounds present.  Neurological:     General: No focal deficit present.     Mental Status: He is alert and oriented to person, place, and time.  Psychiatric:        Mood and Affect: Mood normal.        Behavior: Behavior normal.      LABORATORY DATA:  I have reviewed the labs as listed.  CBC Latest Ref Rng & Units 11/24/2019 11/07/2019 06/28/2019  WBC 4.0 - 10.5 K/uL 7.6 8.0 9.2  Hemoglobin 13.0 - 17.0 g/dL 14.1 15.2 15.3  Hematocrit 39 - 52 % 43.5  47.1 47.5  Platelets 150 - 400 K/uL 261 276 302   CMP Latest Ref Rng & Units 11/24/2019 11/07/2019 09/13/2019  Glucose 70 - 99 mg/dL 205(H) 213(H) 184(H)  BUN 8 - 23 mg/dL _0 Creatinine 0.61 - 1.24 mg/dL 0.75 0.75 0.85  Sodium 135 - 145 mmol/L 138 137 137  Potassium 3.5 - 5.1 mmol/L 4.1 3.8 4.0  Chloride 98 - 111 mmol/L 101 101 99  CO2 22 - 32 mmol/L _1 Calcium 8.9 - 10.3 mg/dL 9.3 9.5 9.6  Total Protein 6.5 - 8.1 g/dL 7.4 7.8 7.2  Total Bilirubin 0.3 - 1.2 mg/dL 0.2(L) 0.5 0.2  Alkaline Phos 38 - 126 U/L 96 101 128(H)  AST 15 - 41 U/L _2 ALT 0 - 44 U/L _3 DIAGNOSTIC IMAGING:  I have independently reviewed the scans and discussed with the patient. CT Chest W Contrast  Result Date: 11/07/2019 CLINICAL DATA:  Follow-up non-small cell lung cancer diagnosed in 1 he 18 status post chemo and radiotherapy. No history of surgery. EXAM: CT CHEST WITH CONTRAST TECHNIQUE: Multidetector CT imaging of the chest was performed during intravenous contrast administration. CONTRAST:  30m OMNIPAQUE IOHEXOL 300 MG/ML  SOLN COMPARISON:  June 28, 2019 and multiple prior studies. FINDINGS: Cardiovascular: Calcified and noncalcified atheromatous plaque of the thoracic aorta. No sign of aneurysm. Marked calcific and noncalcific changes are unchanged. LEFT-sided Port-A-Cath terminates at the distal aspect of the superior vena cava. No pericardial effusion or pericardial thickening beyond what was present on the prior study near the superior aspect of the pericardium. Central pulmonary vasculature is unremarkable on venous phase assessment. Mediastinum/Nodes: Thoracic inlet structures are normal. No axillary lymphadenopathy. No mediastinal lymphadenopathy. Soft tissue about the RIGHT posterior suprahilar region adjacent to the mediastinal border in the medial RIGHT lung measures 2.5 x 2.8 cm more confluent than on the previous exam, developing increasing consolidative appearance over a series  of prior imaging studies. On the prior study when measured in a similar location this shows a similar size but with further volume loss in the area. Lungs/Pleura: Increasing size of a spiculated nodular density in the superior segment of the RIGHT lower lobe (image 66, series 4) Increasing size of nodular densities in the RIGHT upper lobe now confluent, largest area on image 38 of series 4 measuring 11 x 5 mm. Increasing size of a nodule in the LEFT upper lobe 11 x 5 mm previously 9 mm greatest dimension. New spiculated nodule in the LEFT upper lobe (image 35, series 4) 6 mm. Stable small LEFT lower lobe pulmonary nodule 6 mm, unchanged dating back to December of 2020. Pulmonary emphysema Upper Abdomen: No acute upper abdominal process. Adrenal glands are normal. Musculoskeletal: No acute musculoskeletal process. Spinal degenerative changes. IMPRESSION: 1. Increasing size of upper lobe pulmonary nodules with new nodule in the LEFT upper lobe as described. Findings are suspicious for metastases, PET CT may be helpful for further assessment. There is also increasing confluent soft tissue in the RIGHT suprahilar region though this is favored to represent evolving post radiation changes. 2. Also with spiculated area in the superior segment of the RIGHT lower lobe along the margin of post radiation changes (image 66, series 4), attention on follow-up. 3. Pulmonary emphysema 4. Aortic atherosclerosis. Aortic Atherosclerosis (ICD10-I70.0). Electronically Signed  By: Zetta Bills M.D.   On: 11/07/2019 13:40   NM PET Image Restag (PS) Skull Base To Thigh  Result Date: 11/22/2019 CLINICAL DATA:  Subsequent treatment strategy for non-small cell lung cancer. EXAM: NUCLEAR MEDICINE PET SKULL BASE TO THIGH TECHNIQUE: 10.2 mCi F-18 FDG was injected intravenously. Full-ring PET imaging was performed from the skull base to thigh after the radiotracer. CT data was obtained and used for attenuation correction and anatomic  localization. Fasting blood glucose: 129 mg/dl COMPARISON:  CT chest dated 11/07/2019 and 06/28/2019. PET-CT dated 03/28/2019. FINDINGS: Mediastinal blood pool activity: SUV max 2.2 Liver activity: SUV max NA NECK: No hypermetabolic cervical lymphadenopathy. Incidental CT findings: none CHEST: Suspected radiation changes in the right paramediastinal/perihilar region, although mild hypermetabolism is present in the right retrohilar region (series 3/image 94), max SUV 2.5. While progressive soft tissue is present in this region (series 2/image 49), this hypermetabolism remains nonfocal and favors radiation changes. Additional radiation changes posteriorly in the right lower lobe (series 3/image 109), non FDG avid. 7 mm central left upper lobe nodule (series 3/image 39), max SUV 1.4. 9 mm aggregate nodule in the lateral right upper lobe (series 3/image 85), max SUV 2.4. These are minimally progressive from more remote priors and raise concern for metastatic disease. 6 mm left lower lobe nodule (series 3/image 117), unchanged, non FDG avid although beneath the size threshold for PET sensitivity. No hypermetabolic thoracic lymphadenopathy. Incidental CT findings: Left chest port terminates in the mid SVC. Atherosclerotic calcifications of the aortic arch. Three vessel coronary atherosclerosis. Mitral valve annular calcifications. ABDOMEN/PELVIS: No hypermetabolic abdominopelvic lymphadenopathy. No abnormal metabolism in the liver, spleen, pancreas, or adrenal glands. Incidental CT findings: Atherosclerotic calcifications of the abdominal aorta and branch vessels. 3.9 cm posterior right lower pole renal cysts. Sigmoid diverticulosis, without evidence of diverticulitis. Prostatomegaly, with suspected BPH, indenting the base of the bladder. SKELETON: No focal hypermetabolic activity to suggest skeletal metastasis. Incidental CT findings: Degenerative changes of the visualized thoracolumbar spine. IMPRESSION: Small bilateral  pulmonary nodules in the upper lobes may be mildly progressive from more remote priors and demonstrate mild hypermetabolism, raising concern for early metastases. Continued attention on follow-up is suggested. Suspected radiation changes in the right paramediastinal/perihilar region, as described above. Additional radiation changes in the right lower lobe. Electronically Signed   By: Julian Hy M.D.   On: 11/22/2019 10:14   VAS [place] LOWER EXT ART SEG MULTI (SEGMENTALS & LE RAYNAUDS)  Result Date: 10/27/2019 LOWER EXTREMITY DOPPLER STUDY Indications: Claudication, and Numbness and tingling in feet, bilaterally. High Risk Factors: Hypertension, hyperlipidemia, Diabetes, current smoker,                    coronary artery disease. Other Factors: Patient complains of pain in his calves with walking 50 feet that                is relieved with rest, right worse than left. This has been                getting progressively worse over the last three months.  Limitations: Today's exam was limited due to Cardiac arrhythmia. Performing Technologist: McFatter, Leafy Ro RVT  Examination Guidelines: A complete evaluation includes at minimum, Doppler waveform signals and systolic blood pressure reading at the level of bilateral brachial, anterior tibial, and posterior tibial arteries, when vessel segments are accessible. Bilateral testing is considered an integral part of a complete examination. Photoelectric Plethysmograph (PPG) waveforms and toe systolic pressure readings are  included as required and additional duplex testing as needed. Limited examinations for reoccurring indications may be performed as noted.  ABI Findings: +---------+------------------+-----+----------+--------+ Right    Rt Pressure (mmHg)IndexWaveform  Comment  +---------+------------------+-----+----------+--------+ Brachial 150                    triphasic          +---------+------------------+-----+----------+--------+ CFA                              triphasic          +---------+------------------+-----+----------+--------+ Popliteal                       monophasic         +---------+------------------+-----+----------+--------+ ATA      103               0.69 monophasic         +---------+------------------+-----+----------+--------+ PTA      108               0.72 monophasic         +---------+------------------+-----+----------+--------+ PERO     99                0.66 monophasic         +---------+------------------+-----+----------+--------+ Great Toe81                0.54 Abnormal           +---------+------------------+-----+----------+--------+ +---------+------------------+-----+----------+-------+ Left     Lt Pressure (mmHg)IndexWaveform  Comment +---------+------------------+-----+----------+-------+ Brachial 145                    triphasic         +---------+------------------+-----+----------+-------+ CFA                             triphasic         +---------+------------------+-----+----------+-------+ Popliteal                       triphasic         +---------+------------------+-----+----------+-------+ ATA      144               0.96 biphasic          +---------+------------------+-----+----------+-------+ PTA      149               0.99 triphasic         +---------+------------------+-----+----------+-------+ PERO     140               0.93 monophasic        +---------+------------------+-----+----------+-------+ Great Toe100               0.67 Abnormal          +---------+------------------+-----+----------+-------+  See Arterial Duplex study  Summary: Right: Resting right ankle-brachial index indicates moderate right lower extremity arterial disease. The right toe-brachial index is abnormal. Left: Resting left ankle-brachial index is within normal range. No evidence of significant left lower extremity arterial disease. The left toe-brachial  index is abnormal.  *See table(s) above for measurements and observations.  Electronically signed by Ida Rogue MD on 10/27/2019 at 12:28:37 PM.    Final    VAS [place] LOWER EXTREMITY ARTERIAL DUPLEX  Result Date: 10/27/2019 LOWER EXTREMITY ARTERIAL DUPLEX STUDY Indications: Claudication, peripheral artery  disease, and Numbness and tingling              in both feet. High Risk Factors: Hypertension, hyperlipidemia, Diabetes, current smoker,                    coronary artery disease. Other Factors: Patient complains of pain in his calves with walking 50 feet that                is relieved with rest, right worse than left. This has been                getting progressively worse over the last three months.  Current ABI: Right 0.72, Left 0.99 Performing Technologist: Jeneen Montgomery RDMS, RVT, RDCS  Examination Guidelines: A complete evaluation includes B-mode imaging, spectral Doppler, color Doppler, and power Doppler as needed of all accessible portions of each vessel. Bilateral testing is considered an integral part of a complete examination. Limited examinations for reoccurring indications may be performed as noted.  +-----------+--------+-----+---------------+----------+--------+ RIGHT      PSV cm/sRatioStenosis       Waveform  Comments +-----------+--------+-----+---------------+----------+--------+ CFA Prox   95                          triphasic          +-----------+--------+-----+---------------+----------+--------+ CFA Distal 117                         triphasic          +-----------+--------+-----+---------------+----------+--------+ DFA        193                                            +-----------+--------+-----+---------------+----------+--------+ SFA Prox   133                         biphasic           +-----------+--------+-----+---------------+----------+--------+ SFA Mid    62                          biphasic            +-----------+--------+-----+---------------+----------+--------+ SFA Distal 432          75-99% stenosismonophasic         +-----------+--------+-----+---------------+----------+--------+ POP Prox   32                          monophasic         +-----------+--------+-----+---------------+----------+--------+ POP Mid                                monophasic         +-----------+--------+-----+---------------+----------+--------+ POP Distal 36                                             +-----------+--------+-----+---------------+----------+--------+ TP Trunk   48                                             +-----------+--------+-----+---------------+----------+--------+  ATA Mid    27                          monophasic         +-----------+--------+-----+---------------+----------+--------+ ATA Distal 26                          monophasic         +-----------+--------+-----+---------------+----------+--------+ PTA Mid    24                          monophasic         +-----------+--------+-----+---------------+----------+--------+ PTA Distal 28                          monophasic         +-----------+--------+-----+---------------+----------+--------+ PERO Distal             occluded                          +-----------+--------+-----+---------------+----------+--------+ A focal velocity elevation of 432 cm/s was obtained at Distal SFA with a VR of 5.9. Findings are characteristic of 75-99% stenosis.  +-----------+--------+-----+---------------+---------+--------+ LEFT       PSV cm/sRatioStenosis       Waveform Comments +-----------+--------+-----+---------------+---------+--------+ CFA Prox   116                         triphasic         +-----------+--------+-----+---------------+---------+--------+ CFA Distal 140                         triphasic         +-----------+--------+-----+---------------+---------+--------+  DFA        150                                           +-----------+--------+-----+---------------+---------+--------+ SFA Prox   167          30-49% stenosistriphasic         +-----------+--------+-----+---------------+---------+--------+ SFA Mid    119                         triphasic         +-----------+--------+-----+---------------+---------+--------+ SFA Distal 225          30-49% stenosistriphasic         +-----------+--------+-----+---------------+---------+--------+ POP Prox   86                          biphasic          +-----------+--------+-----+---------------+---------+--------+ POP Distal 78                          biphasic          +-----------+--------+-----+---------------+---------+--------+ TP Trunk   87                          biphasic          +-----------+--------+-----+---------------+---------+--------+ ATA Mid    54  biphasic          +-----------+--------+-----+---------------+---------+--------+ ATA Distal 65                          biphasic          +-----------+--------+-----+---------------+---------+--------+ PTA Mid    87                          biphasic          +-----------+--------+-----+---------------+---------+--------+ PTA Distal 100                         biphasic          +-----------+--------+-----+---------------+---------+--------+ PERO Distal71                          biphasic          +-----------+--------+-----+---------------+---------+--------+ A focal velocity elevation of 225 cm/s was obtained at Distal SFA with a VR of 1.9. Findings are characteristic of 30-49% stenosis.  Summary: Right: 75-99% stenosis noted in the superficial femoral artery. Left: 30-49% stenosis noted in the superficial femoral artery. See Arterial Doppler study.  See table(s) above for measurements and observations. Vascular consult recommended. Electronically signed by  Ida Rogue MD on 10/27/2019 at 4:36:05 PM.    Final      ASSESSMENT:  1. Stage IIIa (T1b N2 cM0) NSCLC, favoring adenocarcinoma: -Chemo XRT with weekly carboplatin and paclitaxel completed on 08/20/2016. -Consolidation durvalumab from 09/26/2016 through 09/25/2017. -Reviewed CT chest on 11/07/2019 which showed increasing size of the upper lobe pulmonary nodules with no new nodule in the left upper lobe suspicious for meta stasis.  Also increasing confluent soft tissue in the right suprahilar region, thought to be postradiation changes. -PET scan on 11/21/2019 showed suspected radiation changes in the right paramediastinal/perihilar region although mild hypermetabolism is present in the right retroareolar region.SUV 2.5.  While progressive soft tissue is present in this region, this hypermetabolism remains nonfocal and favors radiation changes.  7 mm central left upper lobe nodule max SUV 1.4.  9 mm aggregate nodule in the lateral right upper lobe max SUV 2.4.  These are minimally progressive from more remote prior scans.  6 mm left lower lobe nodule unchanged.  No hypermetabolic thoracic adenopathy.   PLAN:  1. Stage IIIa (T1b N2 cM0) NSCLC, favoring adenocarcinoma: -We discussed the PET CT scan findings and images with the patient. -No strong indication of metastatic disease at this time.  We have also reviewed his LFTs and other labs which are within normal limits. -Plan to repeat CT chest with contrast in 4 months.  2. Hypothyroidism: -Continue Synthroid.  3. Difficulty staying asleep: -Continue temazepam.   Orders placed this encounter:  No orders of the defined types were placed in this encounter.    Derek Jack, MD [place] [phone]   I, Milinda Antis, am acting as a scribe for Dr. Sanda Linger.  I, Derek Jack MD, have reviewed the above documentation for accuracy and completeness, and I agree with the above.

## 2019-11-26 ENCOUNTER — Other Ambulatory Visit (HOSPITAL_COMMUNITY): Payer: Self-pay | Admitting: Nurse Practitioner

## 2019-11-26 ENCOUNTER — Other Ambulatory Visit: Payer: Self-pay | Admitting: Family

## 2019-11-26 DIAGNOSIS — G479 Sleep disorder, unspecified: Secondary | ICD-10-CM

## 2019-11-30 ENCOUNTER — Ambulatory Visit: Payer: Medicare Other | Admitting: Cardiovascular Disease

## 2019-12-02 ENCOUNTER — Telehealth: Payer: Self-pay | Admitting: Internal Medicine

## 2019-12-02 MED ORDER — TRELEGY ELLIPTA 100-62.5-25 MCG/INH IN AEPB: INHALATION_SPRAY | RESPIRATORY_TRACT | 11 refills | Status: DC

## 2019-12-02 MED ORDER — TRELEGY ELLIPTA 100-62.5-25 MCG/INH IN AEPB
INHALATION_SPRAY | RESPIRATORY_TRACT | 11 refills | Status: DC
Start: 2019-12-02 — End: 2019-12-14

## 2019-12-02 NOTE — Telephone Encounter (Signed)
Rx for trelegy was refilled and I have called and left detailed msg on machine for pt to be made aware ok per DPR.

## 2019-12-07 ENCOUNTER — Encounter: Payer: Self-pay | Admitting: Cardiovascular Disease

## 2019-12-07 ENCOUNTER — Ambulatory Visit (INDEPENDENT_AMBULATORY_CARE_PROVIDER_SITE_OTHER): Payer: Medicare Other | Admitting: Cardiovascular Disease

## 2019-12-07 ENCOUNTER — Other Ambulatory Visit: Payer: Self-pay

## 2019-12-07 VITALS — BP 152/82 | Ht 66.0 in | Wt 183.2 lb

## 2019-12-07 DIAGNOSIS — I10 Essential (primary) hypertension: Secondary | ICD-10-CM | POA: Diagnosis not present

## 2019-12-07 DIAGNOSIS — I739 Peripheral vascular disease, unspecified: Secondary | ICD-10-CM | POA: Diagnosis not present

## 2019-12-07 NOTE — Assessment & Plan Note (Signed)
John [name] was referred to me by Bernerd Pho, PA-C for symptomatic PAD.  He has multiple cardiac risk factors including tobacco abuse, treated hypertension, diabetes and hyperlipidemia.  He has had right calf claudication for a year.  He can only walk 25 steps.  He had lower extremity arterial Doppler studies performed 10/26/2019 revealing a right ABI of 0.72 and a left of 0.99.  He had a high-frequency signal in his distal right SFA most likely amenable to endovascular therapy.  Because of limitation on elective procedures due to COVID-19 I am going to delay this for 6 weeks.  I will see him back at that time and will schedule the procedure.

## 2019-12-07 NOTE — Progress Notes (Signed)
12/07/2019 John [name]   24-Mar-1946  [id number]  Primary Physician [name], FNP Primary Cardiologist: Lorretta Harp MD FACP, [place], [place], [place]  HPI:  John [name] is a 74 y.o. mild to moderately overweight single Caucasian male father of 52, grandfather of 6 grandchildren who is accompanied by his significant other [name] today.  He was referred by Bernerd Pho, PA-C for evaluation of lifestyle limiting claudication.  His risk factors include 60-pack-year tobacco abuse continue to smoke 1 pack/day recalcitrant risk factor modification despite having been diagnosed with lung cancer.  History of hypertension, diabetes and hyperlipidemia all treated medically.  There is no family history for CAD the.  Is never had a heart attack or stroke.  He does complain of shortness of breath most likely related to COPD.  He has coronary calcifications on his chest CT performed a year ago and it Myoview stress test that was low risk and nonischemic.  He does complain of right calf claudication over the last 12 months with recent Doppler studies performed 10/26/2019 revealing right ABI 0.72 and a left of 0.99 with a high-frequency signal in his distal right SFA most likely amenable but the end of the endovascular therapy.   Current Meds  Medication Sig  . amLODipine (NORVASC) 5 MG tablet Take 1 tablet (5 mg total) by mouth daily.  Marland Kitchen aspirin EC 81 MG tablet Take 81 mg by mouth daily.  . Blood Glucose Monitoring Suppl (ONETOUCH VERIO REFLECT) w/Device KIT Use to test blood sugars daily as directed. DX: E11.9  . cloNIDine (CATAPRES) 0.1 MG tablet TAKE 1 TABLET (0.1 MG TOTAL) BY MOUTH 3 (THREE) TIMES DAILY.  Marland Kitchen dutasteride (AVODART) 0.5 MG capsule TAKE 1 CAPSULE BY MOUTH EVERY DAY  . fluticasone (FLONASE) 50 MCG/ACT nasal spray Place 2 sprays into both nostrils daily.  . Fluticasone-Umeclidin-Vilant (TRELEGY ELLIPTA) 100-62.5-25 MCG/INH AEPB One click each am  . gabapentin (NEURONTIN) 300 MG  capsule TAKE 1 CAPSULE BY MOUTH IN THE MORNING AND 2 CAPSULES AT NIGHT  . glucose blood (ONETOUCH VERIO) test strip Use to test blood sugars daily as directed. DX: E11.9  . levothyroxine (SYNTHROID) 75 MCG tablet Take 1 tablet (75 mcg total) by mouth daily.  . meloxicam (MOBIC) 7.5 MG tablet Take 1 tablet (7.5 mg total) by mouth daily as needed for pain (back pain).  . metFORMIN (GLUCOPHAGE-XR) 500 MG 24 hr tablet TAKE 1 TABLET BY MOUTH EVERY DAY WITH BREAKFAST  . metoprolol succinate (TOPROL XL) 25 MG 24 hr tablet Take 0.5 tablets (12.5 mg total) by mouth daily.  Glory Rosebush Delica Lancets 01E MISC Use to test blood sugars daily as directed. DX: E11.9  . pravastatin (PRAVACHOL) 40 MG tablet Take 1 tablet (40 mg total) by mouth every 3 (three) days. M-W-F  . prochlorperazine (COMPAZINE) 10 MG tablet Take 1 tablet (10 mg total) by mouth every 6 (six) hours as needed for nausea or vomiting.  . Semaglutide (RYBELSUS) 7 MG TABS Take 7 mg by mouth every morning.  . tamsulosin (FLOMAX) 0.4 MG CAPS capsule TAKE 2 CAPSULES BY MOUTH EVERY DAY  . temazepam (RESTORIL) 15 MG capsule Take 15 mg by mouth at bedtime as needed for sleep.  Marland Kitchen zolpidem (AMBIEN) 5 MG tablet TAKE 1 TABLET (5 MG TOTAL) BY MOUTH AT BEDTIME AS NEEDED FOR SLEEP.     Allergies  Allergen Reactions  . Jardiance [Empagliflozin]     FEELS SLUGGISH, TIRED  . Lopressor [Metoprolol]     Fatigue  Social History   Socioeconomic History  . Marital status: Divorced    Spouse name: Not on file  . Number of children: 5  . Years of education: 10  . Highest education level: 10th grade  Occupational History  . Occupation: Retired  Tobacco Use  . Smoking status: Current Every Day Smoker    Packs/day: 0.50    Years: 55.00    Pack years: 27.50    Types: Cigarettes    Start date: 03/11/1961  . Smokeless tobacco: Never Used  . Tobacco comment: half pack to pack a day just depends on what he is doing  Vaping Use  . Vaping Use: Never used   Substance and Sexual Activity  . Alcohol use: No  . Drug use: No  . Sexual activity: Yes    Birth control/protection: None  Other Topics Concern  . Not on file  Social History Narrative  . Not on file   Social Determinants of Health   Financial Resource Strain: Low Risk   . Difficulty of Paying Living Expenses: Not hard at all  Food Insecurity: No Food Insecurity  . Worried About Charity fundraiser in the Last Year: Never true  . Ran Out of Food in the Last Year: Never true  Transportation Needs: No Transportation Needs  . Lack of Transportation (Medical): No  . Lack of Transportation (Non-Medical): No  Physical Activity: Inactive  . Days of Exercise per Week: 0 days  . Minutes of Exercise per Session: 0 min  Stress: No Stress Concern Present  . Feeling of Stress : Not at all  Social Connections: Moderately Isolated  . Frequency of Communication with Friends and Family: More than three times a week  . Frequency of Social Gatherings with Friends and Family: More than three times a week  . Attends Religious Services: Never  . Active Member of Clubs or Organizations: No  . Attends Archivist Meetings: Never  . Marital Status: Living with partner  Intimate Partner Violence: Not At Risk  . Fear of Current or Ex-Partner: No  . Emotionally Abused: No  . Physically Abused: No  . Sexually Abused: No     Review of Systems: General: negative for chills, fever, night sweats or weight changes.  Cardiovascular: negative for chest pain, dyspnea on exertion, edema, orthopnea, palpitations, paroxysmal nocturnal dyspnea or shortness of breath Dermatological: negative for rash Respiratory: negative for cough or wheezing Urologic: negative for hematuria Abdominal: negative for nausea, vomiting, diarrhea, bright red blood per rectum, melena, or hematemesis Neurologic: negative for visual changes, syncope, or dizziness All other systems reviewed and are otherwise negative except  as noted above.    Blood pressure (!) 152/82, height 5' 6"  (1.676 m), weight 183 lb 3.2 oz (83.1 kg).  General appearance: alert and no distress Neck: no adenopathy, no carotid bruit, no JVD, supple, symmetrical, trachea midline and thyroid not enlarged, symmetric, no tenderness/mass/nodules Lungs: clear to auscultation bilaterally Heart: regular rate and rhythm, S1, S2 normal, no murmur, click, rub or gallop Extremities: extremities normal, atraumatic, no cyanosis or edema Pulses: Diminished right pedal pulse Skin: Skin color, texture, turgor normal. No rashes or lesions Neurologic: Alert and oriented X 3, normal strength and tone. Normal symmetric reflexes. Normal coordination and gait  EKG sinus tachycardia 106 small inferior Q waves.  I personally reviewed this EKG.  ASSESSMENT AND PLAN:   PAD (peripheral artery disease) [facility]) Mr. Daversa was referred to me by Bernerd Pho, PA-C for symptomatic PAD.  He  has multiple cardiac risk factors including tobacco abuse, treated hypertension, diabetes and hyperlipidemia.  He has had right calf claudication for a year.  He can only walk 25 steps.  He had lower extremity arterial Doppler studies performed 10/26/2019 revealing a right ABI of 0.72 and a left of 0.99.  He had a high-frequency signal in his distal right SFA most likely amenable to endovascular therapy.  Because of limitation on elective procedures due to COVID-19 I am going to delay this for 6 weeks.  I will see him back at that time and will schedule the procedure.      Lorretta Harp MD FACP,FACC,FAHA, [facility] 12/07/2019 1:49 PM

## 2019-12-07 NOTE — Patient Instructions (Signed)
Medication Instructions:  No Changes In Medications at this time.   *If you need a refill on your cardiac medications before your next appointment, please call your pharmacy*  Lab Work: None Ordered At This Time.   If you have labs (blood work) drawn today and your tests are completely normal, you will receive your results only by: Marland Kitchen MyChart Message (if you have MyChart) OR . A paper copy in the mail If you have any lab test that is abnormal or we need to change your treatment, we will call you to review the results.  Testing/Procedures: None Ordered At This Time.    Follow-Up: At [facility], you and your health needs are our priority.  As part of our continuing mission to provide you with exceptional heart care, we have created designated Provider Care Teams.  These Care Teams include your primary Cardiologist (physician) and Advanced Practice Providers (APPs -  Physician Assistants and Nurse Practitioners) who all work together to provide you with the care you need, when you need it.  Your next appointment:   6 week(s)   The format for your next appointment:   In Person  Provider:   Quay Burow, MD

## 2019-12-14 ENCOUNTER — Other Ambulatory Visit: Payer: Self-pay

## 2019-12-14 ENCOUNTER — Ambulatory Visit (INDEPENDENT_AMBULATORY_CARE_PROVIDER_SITE_OTHER): Payer: Medicare Other | Admitting: Internal Medicine

## 2019-12-14 ENCOUNTER — Encounter: Payer: Self-pay | Admitting: Internal Medicine

## 2019-12-14 DIAGNOSIS — I2510 Atherosclerotic heart disease of native coronary artery without angina pectoris: Secondary | ICD-10-CM | POA: Diagnosis not present

## 2019-12-14 DIAGNOSIS — I10 Essential (primary) hypertension: Secondary | ICD-10-CM | POA: Diagnosis not present

## 2019-12-14 DIAGNOSIS — F17210 Nicotine dependence, cigarettes, uncomplicated: Secondary | ICD-10-CM | POA: Diagnosis not present

## 2019-12-14 DIAGNOSIS — J449 Chronic obstructive pulmonary disease, unspecified: Secondary | ICD-10-CM | POA: Diagnosis not present

## 2019-12-14 MED ORDER — TRELEGY ELLIPTA 100-62.5-25 MCG/INH IN AEPB
INHALATION_SPRAY | RESPIRATORY_TRACT | 11 refills | Status: DC
Start: 2019-12-14 — End: 2020-01-20

## 2019-12-14 MED ORDER — ALBUTEROL SULFATE HFA 108 (90 BASE) MCG/ACT IN AERS: 2.0000 | INHALATION_SPRAY | RESPIRATORY_TRACT | 6 refills | Status: DC | PRN

## 2019-12-14 NOTE — Assessment & Plan Note (Signed)
Try off acedi 10/24/2019 due to pseudowheeze on exam > resolved   Although even in retrospect it may not be clear the ACEi contributed to the pt's symptoms,  Upper airway cough better off acei adding   back at this point or in the future would risk confusion in interpretation of non-specific respiratory symptoms to which this patient is prone  ie  Better not to muddy the waters here.   >>  Adequate control on present rx, reviewed in detail with pt > no change in rx needed so rec contine benicar 40 mg daily

## 2019-12-14 NOTE — Assessment & Plan Note (Signed)
Active smoker - Spirometry 10/24/2019  FEV1 0.94 (32%)  Ratio 0.48 - 10/24/2019  After extensive coaching inhaler device,  effectiveness =    90% > change breo 200/ incruse to Trelegy 100 q am  - alpha one AT screen  12/14/2019 >>>    Group D in terms of symptom/risk and laba/lama/ICS  therefore appropriate rx at this point >>>  Continue trelegy plus prn saba   I spent extra time with pt today reviewing appropriate use of albuterol for prn use on exertion with the following points: 1) saba is for relief of sob that does not improve by walking a slower pace or resting but rather if the pt does not improve after trying this first. 2) If the pt is convinced, as many are, that saba helps recover from activity faster then it's easy to tell if this is the case by re-challenging : ie stop, take the inhaler, then p 5 minutes try the exact same activity (intensity of workload) that just caused the symptoms and see if they are substantially diminished or not after saba 3) if there is an activity that reproducibly causes the symptoms, try the saba 15 min before the activity on alternate days   If in fact the saba really does help, then fine to continue to use it prn but advised may need to look closer at the maintenance regimen being used to achieve better control of airways disease with exertion.

## 2019-12-14 NOTE — Patient Instructions (Addendum)
No change in medications  The key is to stop smoking completely before smoking completely stops you!  Suggest e-cigs as an optional  "one way bridge"  Off all tobacco products     Please remember to go to the lab department @ [facility] for your tests - we will call you with the results when they are available.        Please schedule a follow up visit in 50months but call sooner if needed

## 2019-12-14 NOTE — Progress Notes (Signed)
John [name], male    DOB: Jun 16, 1945,    MRN: [id number]   Brief patient profile:  29 yowm active smoker with onset of sob around 2015 dx as copd at [place] with GOLD III criteria then dx  RUL lung ca [name] nav bx then  rx chemo/RT summary rx     Non-small cell lung cancer, right ([facility]) 1.  Stage IIIa (T1b N2 cM0) NSCLC, favoring adenocarcinoma: -Chemo XRT with weekly carboplatin and paclitaxel completed on 08/20/2016. -Consolidation durvalumab from 09/26/2016 through 09/25/2017.     History of Present Illness  10/24/2019  Pulmonary/ 1st office eval/Josuel Koeppen on breo 200/ incruse and lisinopril  Chief Complaint  Patient presents with  . Consult    Patient has shortness of breath with exertion. When resting he is fine. Productive cough with clear sputum. Had lung cancer and has COPD.   Dyspnea:  Walking x mailbox = 25 ft slt incline  Cough: am mucoid clear  Sleep: flat bed/ 2 pillows/ cpap  SABA use:  q 6h prn  rec Stop lisinopril substitute  benicar 40 mg daily in place of lisinopril  - if blood pressure too low then reduce or stop the amlodipine as it is likely contributing to your fluid in your legs Stop breo and incruse  and substitute Trelogy one click am two good drags hold at least 5 seconds and out thru the nose Only use your albuterol as a rescue medication  The key is to stop smoking completely before smoking completely stops you!   12/14/2019  f/u ov/[place] office/Ellissa Ayo re: GOLD III/ still smoking  Chief Complaint  Patient presents with  . Follow-up    shortness of breath with exertion  Dyspnea:  Slowed down by R leg claudication  Cough: am congestion few minutes  Sleeping: flat bed 2 pillows and CPAP per [place] SABA use:  Sometimes at hs  02: none    No obvious day to day or daytime variability or assoc excess/ purulent sputum or mucus plugs or hemoptysis or cp or chest tightness, subjective wheeze or overt sinus or hb symptoms.   Sleeping   without nocturnal exacerbation  of respiratory  c/o's or need for noct saba. Also denies any obvious fluctuation of symptoms with weather or environmental changes or other aggravating or alleviating factors except as outlined above   No unusual exposure hx or h/o childhood pna/ asthma or knowledge of premature birth.  Current Allergies, Complete Past Medical History, Past Surgical History, Family History, and Social History were reviewed in [employer] record.  ROS  The following are not active complaints unless bolded Hoarseness, sore throat, dysphagia, dental problems, itching, sneezing,  nasal congestion or discharge of excess mucus or purulent secretions, ear ache,   fever, chills, sweats, unintended wt loss or wt gain, classically pleuritic or exertional cp,  orthopnea pnd or arm/hand swelling  or leg swelling, presyncope, palpitations, abdominal pain, anorexia, nausea, vomiting, diarrhea  or change in bowel habits or change in bladder habits, change in stools or change in urine, dysuria, hematuria,  rash, arthralgias, visual complaints, headache, numbness, weakness or ataxia or problems with walking or coordination,  change in mood or  Memory. R leg claudication / Dr Gwenlyn Found following         Current Meds  Medication Sig  . amLODipine (NORVASC) 5 MG tablet Take 1 tablet (5 mg total) by mouth daily.  Marland Kitchen aspirin EC 81 MG tablet Take 81 mg by mouth daily.  Marland Kitchen  Blood Glucose Monitoring Suppl (ONETOUCH VERIO REFLECT) w/Device KIT Use to test blood sugars daily as directed. DX: E11.9  . cloNIDine (CATAPRES) 0.1 MG tablet TAKE 1 TABLET (0.1 MG TOTAL) BY MOUTH 3 (THREE) TIMES DAILY.  Marland Kitchen dutasteride (AVODART) 0.5 MG capsule TAKE 1 CAPSULE BY MOUTH EVERY DAY  . fluticasone (FLONASE) 50 MCG/ACT nasal spray Place 2 sprays into both nostrils daily.  . Fluticasone-Umeclidin-Vilant (TRELEGY ELLIPTA) 100-62.5-25 MCG/INH AEPB One click each am  . furosemide (LASIX) 20 MG tablet Take 1 tablet  (20 mg total) by mouth 2 (two) times daily.  Marland Kitchen gabapentin (NEURONTIN) 300 MG capsule TAKE 1 CAPSULE BY MOUTH IN THE MORNING AND 2 CAPSULES AT NIGHT  . glucose blood (ONETOUCH VERIO) test strip Use to test blood sugars daily as directed. DX: E11.9  . levothyroxine (SYNTHROID) 75 MCG tablet Take 1 tablet (75 mcg total) by mouth daily.  . meloxicam (MOBIC) 7.5 MG tablet Take 1 tablet (7.5 mg total) by mouth daily as needed for pain (back pain).  . metFORMIN (GLUCOPHAGE-XR) 500 MG 24 hr tablet TAKE 1 TABLET BY MOUTH EVERY DAY WITH BREAKFAST  . metoprolol succinate (TOPROL XL) 25 MG 24 hr tablet Take 0.5 tablets (12.5 mg total) by mouth daily.  Marland Kitchen olmesartan (BENICAR) 40 MG tablet Take 1 tablet (40 mg total) by mouth daily.  Glory Rosebush Delica Lancets 91P MISC Use to test blood sugars daily as directed. DX: E11.9  . pravastatin (PRAVACHOL) 40 MG tablet Take 1 tablet (40 mg total) by mouth every 3 (three) days. M-W-F  . prochlorperazine (COMPAZINE) 10 MG tablet Take 1 tablet (10 mg total) by mouth every 6 (six) hours as needed for nausea or vomiting.  . Semaglutide (RYBELSUS) 7 MG TABS Take 7 mg by mouth every morning.  . tamsulosin (FLOMAX) 0.4 MG CAPS capsule TAKE 2 CAPSULES BY MOUTH EVERY DAY  . temazepam (RESTORIL) 15 MG capsule Take 15 mg by mouth at bedtime as needed for sleep.  Marland Kitchen zolpidem (AMBIEN) 5 MG tablet TAKE 1 TABLET (5 MG TOTAL) BY MOUTH AT BEDTIME AS NEEDED FOR SLEEP.               Past Medical History:  Diagnosis Date  . Adenocarcinoma of lung, right ([facility]) 04/18/2016  . Anxiety   . Arthritis   . Asthma   . CHF (congestive heart failure) ([place] [facility])   . COPD (chronic obstructive pulmonary disease) ([place])   . Depression   . Diabetes mellitus without complication (HCC)    no meds  . DM (diabetes mellitus) ([place]) 07/09/2016  . Dyspnea   . History of kidney stones   . Hyperlipidemia   . Hypertension   . Macular degeneration   . Neuropathy   . Non-small cell lung cancer, right ([place])  04/18/2016  . Prostatitis   . Pulmonary nodule, left 07/16/2016  . Sleep apnea    cpap      Objective:    Wt Readings from Last 3 Encounters:  12/14/19 183 lb 9.6 oz (83.3 kg)  12/07/19 183 lb 3.2 oz (83.1 kg)  11/24/19 183 lb 3.2 oz (83.1 kg)     Vital signs reviewed - Note on arrival 12/14/2019  02 sats  95% on RA   And BP 138/74    HEENT : pt wearing mask not removed for exam due to covid - 19 concerns.    NECK :  without JVD/Nodes/TM/ nl carotid upstrokes bilaterally   LUNGS: no acc muscle use,  Mild barrel  contour  chest wall with bilateral  Distant bs s audible wheeze and  without cough on insp or exp maneuvers  and mild  Hyperresonant  to  percussion bilaterally     CV:  RRR  no s3 or murmur or increase in P2, and trace pitting  edema both LEs  ABD:  soft and nontender with pos end  insp Hoover's  in the supine position. No bruits or organomegaly appreciated, bowel sounds nl  MS:   Nl gait/  ext warm without deformities, calf tenderness, cyanosis or clubbing No obvious joint restrictions   SKIN: warm and dry without lesions    NEURO:  alert, approp, nl sensorium with  no motor or cerebellar deficits apparent.              Labs ordered 12/14/2019  :      alpha one AT phenotype      Assessment

## 2019-12-14 NOTE — Assessment & Plan Note (Addendum)
Counseled re importance of smoking cessation but did not meet time criteria for separate billing    Advised on approp use of e cigs as one way bridge off all tobacco and nicotine products         Each maintenance medication was reviewed in detail including emphasizing most importantly the difference between maintenance and prns and under what circumstances the prns are to be triggered using an action plan format where appropriate.  Total time for H and P, chart review, counseling, teaching device and generating customized AVS unique to this office visit / charting = 32 min

## 2019-12-16 ENCOUNTER — Telehealth: Payer: Self-pay | Admitting: Pharmacist

## 2019-12-16 ENCOUNTER — Encounter: Payer: Self-pay | Admitting: Family

## 2019-12-16 ENCOUNTER — Ambulatory Visit (INDEPENDENT_AMBULATORY_CARE_PROVIDER_SITE_OTHER): Payer: Medicare Other | Admitting: Family

## 2019-12-16 ENCOUNTER — Other Ambulatory Visit: Payer: Self-pay

## 2019-12-16 VITALS — BP 144/78 | HR 104 | Temp 97.3°F | Ht 66.0 in | Wt 182.6 lb

## 2019-12-16 DIAGNOSIS — J431 Panlobular emphysema: Secondary | ICD-10-CM | POA: Diagnosis not present

## 2019-12-16 DIAGNOSIS — E039 Hypothyroidism, unspecified: Secondary | ICD-10-CM | POA: Diagnosis not present

## 2019-12-16 DIAGNOSIS — I10 Essential (primary) hypertension: Secondary | ICD-10-CM

## 2019-12-16 DIAGNOSIS — E1142 Type 2 diabetes mellitus with diabetic polyneuropathy: Secondary | ICD-10-CM

## 2019-12-16 DIAGNOSIS — C3491 Malignant neoplasm of unspecified part of right bronchus or lung: Secondary | ICD-10-CM | POA: Diagnosis not present

## 2019-12-16 DIAGNOSIS — E1169 Type 2 diabetes mellitus with other specified complication: Secondary | ICD-10-CM | POA: Diagnosis not present

## 2019-12-16 DIAGNOSIS — G4733 Obstructive sleep apnea (adult) (pediatric): Secondary | ICD-10-CM | POA: Diagnosis not present

## 2019-12-16 DIAGNOSIS — F17210 Nicotine dependence, cigarettes, uncomplicated: Secondary | ICD-10-CM

## 2019-12-16 DIAGNOSIS — R3912 Poor urinary stream: Secondary | ICD-10-CM

## 2019-12-16 DIAGNOSIS — E785 Hyperlipidemia, unspecified: Secondary | ICD-10-CM

## 2019-12-16 DIAGNOSIS — I739 Peripheral vascular disease, unspecified: Secondary | ICD-10-CM | POA: Diagnosis not present

## 2019-12-16 DIAGNOSIS — E119 Type 2 diabetes mellitus without complications: Secondary | ICD-10-CM | POA: Diagnosis not present

## 2019-12-16 DIAGNOSIS — N401 Enlarged prostate with lower urinary tract symptoms: Secondary | ICD-10-CM

## 2019-12-16 DIAGNOSIS — I2510 Atherosclerotic heart disease of native coronary artery without angina pectoris: Secondary | ICD-10-CM

## 2019-12-16 DIAGNOSIS — F5101 Primary insomnia: Secondary | ICD-10-CM

## 2019-12-16 DIAGNOSIS — I509 Heart failure, unspecified: Secondary | ICD-10-CM

## 2019-12-16 LAB — BAYER DCA HB A1C WAIVED: HB A1C (BAYER DCA - WAIVED): 7.5 % — ABNORMAL HIGH (ref <7.0)

## 2019-12-16 MED ORDER — ONDANSETRON 4 MG PO TBDP: 4.0000 mg | ORAL_TABLET | Freq: Three times a day (TID) | ORAL | 0 refills | Status: DC | PRN

## 2019-12-16 MED ORDER — GABAPENTIN 300 MG PO CAPS: 300.0000 mg | ORAL_CAPSULE | Freq: Three times a day (TID) | ORAL | 2 refills | Status: DC

## 2019-12-16 NOTE — Telephone Encounter (Signed)
Patient complaining of nausea with Rybelsus--zofran given to help ease  Patient's A1c has decreased to 7.1%  Demonstrated how to use one touch verio glucometer  FBG 120 this AM

## 2019-12-16 NOTE — Progress Notes (Signed)
Subjective:    Patient ID: John [name], male    DOB: [birth year], 74 y.o.   MRN: [id number]  Chief Complaint  Patient presents with  . Medical Management of Chronic Issues  . Diabetes  . Edema   Pt presents to the officetoday for chronic care follow up. He is followed by Cardiologists  for CHF. Followed by Oncologists every 6 months for non-small lung cancer.  He is followed by Pulmonologist every 6 months for COPD.  Diabetes He presents for his follow-up diabetic visit. He has type 2 diabetes mellitus. His disease course has been stable. There are no hypoglycemic associated symptoms. Associated symptoms include fatigue and foot paresthesias. Pertinent negatives for diabetes include no blurred vision and no chest pain. Symptoms are stable. Diabetic complications include nephropathy and peripheral neuropathy. Pertinent negatives for diabetic complications include no CVA. Risk factors for coronary artery disease include sedentary lifestyle, tobacco exposure, male sex, hypertension, diabetes mellitus and dyslipidemia. He is following a generally healthy diet. His overall blood glucose range is 110-130 mg/dl.  Hypertension This is a chronic problem. The current episode started more than 1 year ago. The problem has been waxing and waning since onset. The problem is controlled. Associated symptoms include malaise/fatigue, peripheral edema and shortness of breath. Pertinent negatives include no blurred vision or chest pain. Risk factors for coronary artery disease include smoking/tobacco exposure, male gender, dyslipidemia and diabetes mellitus. There is no history of CVA. Identifiable causes of hypertension include a thyroid problem.  Thyroid Problem Presents for follow-up visit. Symptoms include constipation, fatigue and hoarse voice. Patient reports no diaphoresis or dry skin. The symptoms have been stable. His past medical history is significant for hyperlipidemia.  Hyperlipidemia This is a  chronic problem. The current episode started more than 1 year ago. Recent lipid tests were reviewed and are normal. Associated symptoms include shortness of breath. Pertinent negatives include no chest pain. The current treatment provides moderate improvement of lipids.  Congestive Heart Failure Presents for follow-up visit. Associated symptoms include edema, fatigue, nocturia and shortness of breath. Pertinent negatives include no chest pain. The symptoms have been stable.  Insomnia Primary symptoms: malaise/fatigue.   Benign Prostatic Hypertrophy This is a chronic problem. The current episode started more than 1 year ago. The problem has been waxing and waning since onset. Irritative symptoms include nocturia.  OSA  Uses CPAP nightly. Stable.     Review of Systems  Constitutional: Positive for fatigue and malaise/fatigue. Negative for diaphoresis.  HENT: Positive for hoarse voice.   Eyes: Negative for blurred vision.  Respiratory: Positive for shortness of breath.   Cardiovascular: Negative for chest pain.  Gastrointestinal: Positive for constipation.  Genitourinary: Positive for nocturia.  Psychiatric/Behavioral: The patient has insomnia.   All other systems reviewed and are negative.      Objective:   Physical Exam Vitals reviewed.  Constitutional:      General: He is not in acute distress.    Appearance: He is well-developed.  HENT:     Head: Normocephalic.     Right Ear: Tympanic membrane normal.     Left Ear: Tympanic membrane normal.  Eyes:     General:        Right eye: No discharge.        Left eye: No discharge.     Pupils: Pupils are equal, round, and reactive to light.  Neck:     Thyroid: No thyromegaly.  Cardiovascular:     Rate and Rhythm: Normal rate and regular  rhythm.     Heart sounds: Normal heart sounds. No murmur heard.   Pulmonary:     Effort: Pulmonary effort is normal. No respiratory distress.     Breath sounds: Normal breath sounds. No  wheezing.  Abdominal:     General: Bowel sounds are normal. There is no distension.     Palpations: Abdomen is soft.     Tenderness: There is no abdominal tenderness.  Musculoskeletal:        General: No tenderness. Normal range of motion.     Cervical back: Normal range of motion and neck supple.     Right lower leg: Edema (3+) present.     Left lower leg: Edema (3+) present.  Skin:    General: Skin is warm and dry.     Findings: No erythema or rash.  Neurological:     Mental Status: He is alert and oriented to person, place, and time.     Cranial Nerves: No cranial nerve deficit.     Deep Tendon Reflexes: Reflexes are normal and symmetric.  Psychiatric:        Behavior: Behavior normal.        Thought Content: Thought content normal.        Judgment: Judgment normal.       BP (!) 144/78   Pulse (!) 104   Temp (!) 97.3 F (36.3 C) (Temporal)   Ht 5\' 6"  (1.676 m)   Wt 182 lb 9.6 oz (82.8 kg)   SpO2 94%   BMI 29.47 kg/m      Assessment & Plan:  John [name] comes in today with chief complaint of Medical Management of Chronic Issues, Diabetes, and Edema   Diagnosis and orders addressed:  1. Type 2 diabetes mellitus without complication, without long-term current use of insulin (HCC) - Bayer DCA Hb A1c Waived  2. Diabetic polyneuropathy associated with type 2 diabetes mellitus (HCC) - gabapentin (NEURONTIN) 300 MG capsule; Take 1 capsule (300 mg total) by mouth 3 (three) times daily.  Dispense: 270 capsule; Refill: 2  3. Congestive heart failure, unspecified HF chronicity, unspecified heart failure type ([facility])  4. Essential hypertension  5. PAD (peripheral artery disease) ([place])  6. Panlobular emphysema ([place])  7. Non-small cell lung cancer, right ([facility])  8. OSA (obstructive sleep apnea)  9. Hyperlipidemia associated with type 2 diabetes mellitus ([facility])  10. Hypothyroidism, unspecified type  11. Benign prostatic hyperplasia with weak urinary stream  12.  Cigarette smoker  13. Primary insomnia   Labs pending Health Maintenance reviewed Diet and exercise encouraged  Follow up plan: 3 months    Evelina Dun, FNP

## 2019-12-16 NOTE — Patient Instructions (Signed)
Health Maintenance After Age 74 After age 74, you are at a higher risk for certain long-term diseases and infections as well as injuries from falls. Falls are a major cause of broken bones and head injuries in people who are older than age 74. Getting regular preventive care can help to keep you healthy and well. Preventive care includes getting regular testing and making lifestyle changes as recommended by your health care provider. Talk with your health care provider about:  Which screenings and tests you should have. A screening is a test that checks for a disease when you have no symptoms.  A diet and exercise plan that is right for you. What should I know about screenings and tests to prevent falls? Screening and testing are the best ways to find a health problem early. Early diagnosis and treatment give you the best chance of managing medical conditions that are common after age 74. Certain conditions and lifestyle choices may make you more likely to have a fall. Your health care provider may recommend:  Regular vision checks. Poor vision and conditions such as cataracts can make you more likely to have a fall. If you wear glasses, make sure to get your prescription updated if your vision changes.  Medicine review. Work with your health care provider to regularly review all of the medicines you are taking, including over-the-counter medicines. Ask your health care provider about any side effects that may make you more likely to have a fall. Tell your health care provider if any medicines that you take make you feel dizzy or sleepy.  Osteoporosis screening. Osteoporosis is a condition that causes the bones to get weaker. This can make the bones weak and cause them to break more easily.  Blood pressure screening. Blood pressure changes and medicines to control blood pressure can make you feel dizzy.  Strength and balance checks. Your health care provider may recommend certain tests to check your  strength and balance while standing, walking, or changing positions.  Foot health exam. Foot pain and numbness, as well as not wearing proper footwear, can make you more likely to have a fall.  Depression screening. You may be more likely to have a fall if you have a fear of falling, feel emotionally low, or feel unable to do activities that you used to do.  Alcohol use screening. Using too much alcohol can affect your balance and may make you more likely to have a fall. What actions can I take to lower my risk of falls? General instructions  Talk with your health care provider about your risks for falling. Tell your health care provider if: ? You fall. Be sure to tell your health care provider about all falls, even ones that seem minor. ? You feel dizzy, sleepy, or off-balance.  Take over-the-counter and prescription medicines only as told by your health care provider. These include any supplements.  Eat a healthy diet and maintain a healthy weight. A healthy diet includes low-fat dairy products, low-fat (lean) meats, and fiber from whole grains, beans, and lots of fruits and vegetables. Home safety  Remove any tripping hazards, such as rugs, cords, and clutter.  Install safety equipment such as grab bars in bathrooms and safety rails on stairs.  Keep rooms and walkways well-lit. Activity   Follow a regular exercise program to stay fit. This will help you maintain your balance. Ask your health care provider what types of exercise are appropriate for you.  If you need a cane or   walker, use it as recommended by your health care provider.  Wear supportive shoes that have nonskid soles. Lifestyle  Do not drink alcohol if your health care provider tells you not to drink.  If you drink alcohol, limit how much you have: ? 0-1 drink a day for women. ? 0-2 drinks a day for men.  Be aware of how much alcohol is in your drink. In the [place] one drink equals one typical bottle of beer (12  oz), one-half glass of wine (5 oz), or one shot of hard liquor (1 oz).  Do not use any products that contain nicotine or tobacco, such as cigarettes and e-cigarettes. If you need help quitting, ask your health care provider. Summary  Having a healthy lifestyle and getting preventive care can help to protect your health and wellness after age 74.  Screening and testing are the best way to find a health problem early and help you avoid having a fall. Early diagnosis and treatment give you the best chance for managing medical conditions that are more common for people who are older than age 74.  Falls are a major cause of broken bones and head injuries in people who are older than age 74. Take precautions to prevent a fall at home.  Work with your health care provider to learn what changes you can make to improve your health and wellness and to prevent falls. This information is not intended to replace advice given to you by your health care provider. Make sure you discuss any questions you have with your health care provider. Document Revised: 07/15/2018 Document Reviewed: 02/04/2017 Elsevier Patient Education  2020 Elsevier Inc.  

## 2019-12-19 ENCOUNTER — Other Ambulatory Visit: Payer: Self-pay | Admitting: Family

## 2019-12-19 DIAGNOSIS — J431 Panlobular emphysema: Secondary | ICD-10-CM

## 2019-12-22 ENCOUNTER — Telehealth: Payer: Self-pay | Admitting: Family

## 2019-12-22 ENCOUNTER — Other Ambulatory Visit: Payer: Self-pay | Admitting: Family

## 2019-12-22 DIAGNOSIS — L84 Corns and callosities: Secondary | ICD-10-CM | POA: Diagnosis not present

## 2019-12-22 DIAGNOSIS — B351 Tinea unguium: Secondary | ICD-10-CM | POA: Diagnosis not present

## 2019-12-22 DIAGNOSIS — E1152 Type 2 diabetes mellitus with diabetic peripheral angiopathy with gangrene: Secondary | ICD-10-CM | POA: Diagnosis not present

## 2019-12-22 DIAGNOSIS — M79676 Pain in unspecified toe(s): Secondary | ICD-10-CM | POA: Diagnosis not present

## 2019-12-23 MED ORDER — POLYETHYLENE GLYCOL 3350 17 GM/SCOOP PO POWD: 17.0000 g | Freq: Every day | ORAL | 1 refills | Status: DC | PRN

## 2019-12-23 NOTE — Telephone Encounter (Signed)
Patient aware and verbalized understanding. °

## 2019-12-23 NOTE — Telephone Encounter (Signed)
Sent MiraLAX for him

## 2019-12-28 ENCOUNTER — Encounter (HOSPITAL_COMMUNITY): Payer: Self-pay

## 2019-12-28 ENCOUNTER — Other Ambulatory Visit (HOSPITAL_COMMUNITY)
Admission: RE | Admit: 2019-12-28 | Discharge: 2019-12-28 | Disposition: A | Discharge status: Home / Self Care | Payer: Medicare Other | Source: Ambulatory Visit | Attending: Internal Medicine | Admitting: Internal Medicine

## 2019-12-28 ENCOUNTER — Inpatient Hospital Stay (HOSPITAL_COMMUNITY): Payer: Medicare Other | Attending: Hematology

## 2019-12-28 ENCOUNTER — Other Ambulatory Visit: Payer: Self-pay

## 2019-12-28 DIAGNOSIS — C3411 Malignant neoplasm of upper lobe, right bronchus or lung: Secondary | ICD-10-CM | POA: Insufficient documentation

## 2019-12-28 DIAGNOSIS — Z452 Encounter for adjustment and management of vascular access device: Secondary | ICD-10-CM | POA: Insufficient documentation

## 2019-12-28 DIAGNOSIS — J449 Chronic obstructive pulmonary disease, unspecified: Secondary | ICD-10-CM

## 2019-12-28 MED ORDER — HEPARIN SOD (PORK) LOCK FLUSH 100 UNIT/ML IV SOLN
500.0000 [IU] | Freq: Once | INTRAVENOUS | Status: AC
  Administered 2019-12-28: 500 [IU] via INTRAVENOUS

## 2019-12-28 MED ORDER — SODIUM CHLORIDE 0.9% FLUSH
10.0000 mL | INTRAVENOUS | Status: DC | PRN
  Administered 2019-12-28: 10 mL via INTRAVENOUS

## 2019-12-28 NOTE — Progress Notes (Signed)
John [name] presented for Portacath access and flush.  Portacath located left chest wall accessed with  H 20 needle.  Good blood return present. Portacath flushed with 69ml NS and 500U/59ml Heparin and needle removed intact.  Procedure tolerated well and without incident.  Discharged ambulatory in stable condition.

## 2019-12-29 LAB — ALPHA-1 ANTITRYPSIN PHENOTYPE: A-1 Antitrypsin, Ser: 147 mg/dL (ref 101–187)

## 2019-12-30 ENCOUNTER — Ambulatory Visit (INDEPENDENT_AMBULATORY_CARE_PROVIDER_SITE_OTHER): Payer: Medicare Other | Admitting: Family

## 2019-12-30 ENCOUNTER — Ambulatory Visit (INDEPENDENT_AMBULATORY_CARE_PROVIDER_SITE_OTHER): Payer: Medicare Other

## 2019-12-30 ENCOUNTER — Other Ambulatory Visit: Payer: Self-pay | Admitting: Family

## 2019-12-30 ENCOUNTER — Encounter: Payer: Self-pay | Admitting: Family

## 2019-12-30 DIAGNOSIS — K5900 Constipation, unspecified: Secondary | ICD-10-CM

## 2019-12-30 DIAGNOSIS — R1084 Generalized abdominal pain: Secondary | ICD-10-CM

## 2019-12-30 DIAGNOSIS — I2510 Atherosclerotic heart disease of native coronary artery without angina pectoris: Secondary | ICD-10-CM | POA: Diagnosis not present

## 2019-12-30 DIAGNOSIS — R110 Nausea: Secondary | ICD-10-CM

## 2019-12-30 DIAGNOSIS — R109 Unspecified abdominal pain: Secondary | ICD-10-CM | POA: Diagnosis not present

## 2019-12-30 NOTE — Progress Notes (Signed)
Virtual Visit via telephone Note Due to COVID-19 pandemic this visit was conducted virtually. This visit type was conducted due to national recommendations for restrictions regarding the COVID-19 Pandemic (e.g. social distancing, sheltering in place) in an effort to limit this patient's exposure and mitigate transmission in our community. All issues noted in this document were discussed and addressed.  A physical exam was not performed with this format.  I connected with John [name] on 12/30/19 at 10:19 AM  by telephone and verified that I am speaking with the correct person using two identifiers. John [name] is currently located at home and no one is currently with him during visit. The provider, Evelina Dun, FNP is located in their office at time of visit.  I discussed the limitations, risks, security and privacy concerns of performing an evaluation and management service by telephone and the availability of in person appointments. I also discussed with the patient that there may be a patient responsible charge related to this service. The patient expressed understanding and agreed to proceed.   History and Present Illness:  PT calls the office today with abdominal pain that comes and goes. He was prescribed the Miralax last week and took it for three days straight and had a regular BM and felt better. States now he is constipated today and had a small BM this morning. He feels nauseated and took a zofran with moderate relief.  Abdominal Pain This is a new problem. The current episode started 1 to 4 weeks ago. The problem occurs intermittently. The pain is located in the LLQ (back). The pain is at a severity of 6/10. The pain is mild. Associated symptoms include constipation and nausea. The pain is aggravated by eating. The pain is relieved by bowel movements. Treatments tried: laxative  The treatment provided mild relief.  Constipation This is a new problem. The current episode started 1  to 4 weeks ago. The problem has been waxing and waning since onset. Associated symptoms include abdominal pain and nausea. Risk factors include obesity. He has tried laxatives for the symptoms. The treatment provided mild relief.      Review of Systems  Gastrointestinal: Positive for abdominal pain, constipation and nausea.     Observations/Objective: No SOB or distress   Assessment and Plan: Navdeep Halt comes in today with chief complaint of No chief complaint on file.   Diagnosis and orders addressed:  1. Generalized abdominal pain - DG Abd 1 View; Future  2. Nausea without vomiting - DG Abd 1 View; Future  3. Constipation, unspecified constipation type - DG Abd 1 View; Future  Start Miralax BID for 3 days, then switch to once a day More than likely side effect from Rybelsus Encourage fiber and healthy diet Force fluids Zofran as needed Will come get x-ray, he is nervous given right lung cancer and would feel better about getting x-ray    I discussed the assessment and treatment plan with the patient. The patient was provided an opportunity to ask questions and all were answered. The patient agreed with the plan and demonstrated an understanding of the instructions.   The patient was advised to call back or seek an in-person evaluation if the symptoms worsen or if the condition fails to improve as anticipated.  The above assessment and management plan was discussed with the patient. The patient verbalized understanding of and has agreed to the management plan. Patient is aware to call the clinic if symptoms persist or worsen. Patient is aware when  to return to the clinic for a follow-up visit. Patient educated on when it is appropriate to go to the emergency department.   Time call ended:  10:33 AM   I provided 14 minutes of non-face-to-face time during this encounter.    Evelina Dun, FNP

## 2020-01-10 ENCOUNTER — Telehealth: Payer: Self-pay

## 2020-01-10 MED ORDER — LINACLOTIDE 72 MCG PO CAPS: 72.0000 ug | ORAL_CAPSULE | Freq: Every day | ORAL | 2 refills | Status: DC

## 2020-01-10 NOTE — Telephone Encounter (Signed)
Linzess Prescription sent to pharmacy

## 2020-01-10 NOTE — Telephone Encounter (Signed)
Patient aware.

## 2020-01-10 NOTE — Telephone Encounter (Signed)
Pt called to let [name] know that the medicine she sent in for him for constipation is not working. Pt needs something else. Says he has not had a bowel movement in 4 days.

## 2020-01-15 ENCOUNTER — Other Ambulatory Visit: Payer: Self-pay | Admitting: Family Medicine

## 2020-01-15 DIAGNOSIS — E1142 Type 2 diabetes mellitus with diabetic polyneuropathy: Secondary | ICD-10-CM

## 2020-01-19 DIAGNOSIS — L97521 Non-pressure chronic ulcer of other part of left foot limited to breakdown of skin: Secondary | ICD-10-CM | POA: Diagnosis not present

## 2020-01-20 ENCOUNTER — Ambulatory Visit (INDEPENDENT_AMBULATORY_CARE_PROVIDER_SITE_OTHER): Payer: Medicare Other | Admitting: Cardiovascular Disease

## 2020-01-20 ENCOUNTER — Other Ambulatory Visit: Payer: Self-pay

## 2020-01-20 ENCOUNTER — Encounter: Payer: Self-pay | Admitting: Cardiovascular Disease

## 2020-01-20 VITALS — BP 146/68 | HR 94 | Ht 66.0 in | Wt 179.0 lb

## 2020-01-20 DIAGNOSIS — Z01818 Encounter for other preprocedural examination: Secondary | ICD-10-CM

## 2020-01-20 DIAGNOSIS — I739 Peripheral vascular disease, unspecified: Secondary | ICD-10-CM

## 2020-01-20 DIAGNOSIS — I2510 Atherosclerotic heart disease of native coronary artery without angina pectoris: Secondary | ICD-10-CM

## 2020-01-20 NOTE — Assessment & Plan Note (Signed)
John [name] returns today for follow-up.  He continues to have lifestyle limiting right calf claudication and wishes to proceed with angiography and endovascular therapy.  He has mild claudication on the left and Dopplers to suggest mild to moderate disease as well.

## 2020-01-20 NOTE — Patient Instructions (Signed)
Medication Instructions:  No changes  *If you need a refill on your cardiac medications before your next appointment, please call your pharmacy*   Testing/Procedures: Your physician has requested that you have a lower extremity arterial duplex-one week from 02/06/20. During this test, ultrasound is used to evaluate arterial blood flow in the legs. Allow one hour for this exam. There are no restrictions or special instructions. This will take place at [place], [address].  Your physician has requested that you have an ankle brachial index (ABI) in one week from 02/06/20. During this test an ultrasound and blood pressure cuff are used to evaluate the arteries that supply the arms and legs with blood. Allow thirty minutes for this exam. There are no restrictions or special instructions. This will take place at [place], [address].   Follow-Up: At [facility], you and your health needs are our priority.  As part of our continuing mission to provide you with exceptional heart care, we have created designated Provider Care Teams.  These Care Teams include your primary Cardiologist (physician) and Advanced Practice Providers (APPs -  Physician Assistants and Nurse Practitioners) who all work together to provide you with the care you need, when you need it.  We recommend signing up for the patient portal called "MyChart".  Sign up information is provided on this After Visit Summary.  MyChart is used to connect with patients for Virtual Visits (Telemedicine).  Patients are able to view lab/test results, encounter notes, upcoming appointments, etc.  Non-urgent messages can be sent to your provider as well.   To learn more about what you can do with MyChart, go to [email or web address].    Your next appointment:   Follow up with Dr. Gwenlyn Found in 2 weeks from 02/06/20   Other Instructions    [place] [address] Dept: [phone] Loc: [phone]  Rik Wadel  01/20/2020  You are scheduled for a Peripheral Angiogram on Monday, November 1 with Dr. Quay Burow.  1. Please arrive at the [facility] (Main Entrance A) at [facility]: [address] [place], [place] [address] at 5:30 AM (This time is two hours before your procedure to ensure your preparation). Free valet parking service is available.   Special note: Every effort is made to have your procedure done on time. Please understand that emergencies sometimes delay scheduled procedures.  2. Diet: Do not eat solid foods after midnight.  The patient may have clear liquids until 5am upon the day of the procedure.  3. Labs: You will need to have blood drawn today: CBC and BMET  You will need to have the coronavirus test completed prior to your procedure. An appointment has been made at 8:05 am on 02/02/20. This is a Drive Up Visit at [address], [place], [place] [address]. Please tell them that you are there for procedure testing. Stay in your car and someone will be with you shortly. Please make sure to have all other labs completed before this test because you will need to stay quarantined until your procedure.  4. Medication instructions in preparation for your procedure: Hold the Furosemide the morning of the procedure Hold the Metformin the morning of the procedure and then 48 hours after.  Hold all diabetic medication the morning of the procedure.   On the morning of your procedure, take your Aspirin and any morning medicines NOT listed above.  You may use sips of water.  5. Plan for one night stay--bring personal belongings. 6. Bring a current list of your medications and current insurance cards. 7. You MUST have a responsible person to drive you home. 8. Someone MUST be with you the first 24 hours after you arrive home or your discharge will be delayed. 9. Please wear  clothes that are easy to get on and off and wear slip-on shoes.  Thank you for allowing [place] to care for you!   -- [facility]

## 2020-01-20 NOTE — Progress Notes (Signed)
   01/20/2020 John [name]   06/19/1945  [id number]  Primary Physician Hawks, Christy A, FNP Primary Cardiologist: Anjani Feuerborn J Emmaline Wahba MD FACP, FACC, FAHA, FSCAI  HPI:  John [name] is a 73 y.o.  mild to moderately overweight single Caucasian male father of 5, grandfather of 6 grandchildren who is accompanied by his significant other John [name] today.  He was referred by Brittany Strader, PA-C for evaluation of lifestyle limiting claudication.  I last saw him in the office 12/07/2019 His risk factors include 60-pack-year tobacco abuse continue to smoke 1 pack/day recalcitrant risk factor modification despite having been diagnosed with lung cancer.  History of hypertension, diabetes and hyperlipidemia all treated medically.  There is no family history for CAD the.  Is never had a heart attack or stroke.  He does complain of shortness of breath most likely related to COPD.  He has coronary calcifications on his chest CT performed a year ago and it Myoview stress test that was low risk and nonischemic.  He does complain of right calf claudication over the last 12 months with recent Doppler studies performed 10/26/2019 revealing right ABI 0.72 and a left of 0.99 with a high-frequency signal in his distal right SFA most likely amenable to  endovascular therapy.  Since I saw him 6 weeks ago he continues to complain of right calf claudication wishes to proceed with endovascular therapy.  He does smoke we talked about the importance of smoking cessation.   Current Meds  Medication Sig  . albuterol (VENTOLIN HFA) 108 (90 Base) MCG/ACT inhaler Inhale 2 puffs into the lungs every 4 (four) hours as needed for wheezing or shortness of breath.  . amLODipine (NORVASC) 5 MG tablet Take 1 tablet (5 mg total) by mouth daily.  . aspirin EC 81 MG tablet Take 81 mg by mouth daily.  . Blood Glucose Monitoring Suppl (ONETOUCH VERIO REFLECT) w/Device KIT Use to test blood sugars daily as directed. DX: E11.9  . cloNIDine  (CATAPRES) 0.1 MG tablet TAKE 1 TABLET (0.1 MG TOTAL) BY MOUTH 3 (THREE) TIMES DAILY.  . fluticasone (FLONASE) 50 MCG/ACT nasal spray Place 2 sprays into both nostrils daily.  . furosemide (LASIX) 20 MG tablet Take 1 tablet (20 mg total) by mouth 2 (two) times daily.  . gabapentin (NEURONTIN) 300 MG capsule Take 1 capsule (300 mg total) by mouth 3 (three) times daily.  . glucose blood (ONETOUCH VERIO) test strip Use to test blood sugars daily as directed. DX: E11.9  . levothyroxine (SYNTHROID) 75 MCG tablet Take 1 tablet (75 mcg total) by mouth daily.  . linaclotide (LINZESS) 72 MCG capsule Take 1 capsule (72 mcg total) by mouth daily before breakfast.  . meloxicam (MOBIC) 7.5 MG tablet TAKE 1 TABLET (7.5 MG TOTAL) BY MOUTH DAILY AS NEEDED FOR PAIN (BACK PAIN).  . metFORMIN (GLUCOPHAGE-XR) 500 MG 24 hr tablet TAKE 1 TABLET BY MOUTH EVERY DAY WITH BREAKFAST  . metoprolol succinate (TOPROL XL) 25 MG 24 hr tablet Take 0.5 tablets (12.5 mg total) by mouth daily.  . olmesartan (BENICAR) 40 MG tablet Take 1 tablet (40 mg total) by mouth daily.  . ondansetron (ZOFRAN ODT) 4 MG disintegrating tablet Take 1 tablet (4 mg total) by mouth every 8 (eight) hours as needed for nausea or vomiting.  . OneTouch Delica Lancets 30G MISC Use to test blood sugars daily as directed. DX: E11.9  . polyethylene glycol powder (GLYCOLAX/MIRALAX) 17 GM/SCOOP powder Take 17 g by mouth daily as needed.  . pravastatin (  PRAVACHOL) 40 MG tablet Take 1 tablet (40 mg total) by mouth every 3 (three) days. M-W-F  . prochlorperazine (COMPAZINE) 10 MG tablet Take 1 tablet (10 mg total) by mouth every 6 (six) hours as needed for nausea or vomiting.  . Semaglutide (RYBELSUS) 7 MG TABS Take 7 mg by mouth every morning.  . tamsulosin (FLOMAX) 0.4 MG CAPS capsule TAKE 2 CAPSULES BY MOUTH EVERY DAY  . temazepam (RESTORIL) 15 MG capsule Take 15 mg by mouth at bedtime as needed for sleep.  . zolpidem (AMBIEN) 5 MG tablet TAKE 1 TABLET (5 MG  TOTAL) BY MOUTH AT BEDTIME AS NEEDED FOR SLEEP.  . [DISCONTINUED] dutasteride (AVODART) 0.5 MG capsule TAKE 1 CAPSULE BY MOUTH EVERY DAY  . [DISCONTINUED] Fluticasone-Umeclidin-Vilant (TRELEGY ELLIPTA) 100-62.5-25 MCG/INH AEPB One click each am     Allergies  Allergen Reactions  . Jardiance [Empagliflozin]     FEELS SLUGGISH, TIRED  . Lopressor [Metoprolol]     Fatigue    Social History   Socioeconomic History  . Marital status: Divorced    Spouse name: Not on file  . Number of children: 5  . Years of education: 10  . Highest education level: 10th grade  Occupational History  . Occupation: Retired  Tobacco Use  . Smoking status: Current Every Day Smoker    Packs/day: 0.50    Years: 55.00    Pack years: 27.50    Types: Cigarettes    Start date: 03/11/1961  . Smokeless tobacco: Never Used  . Tobacco comment: 1/2 pack to 1 pack per day 12/14/19  Vaping Use  . Vaping Use: Never used  Substance and Sexual Activity  . Alcohol use: No  . Drug use: No  . Sexual activity: Yes    Birth control/protection: None  Other Topics Concern  . Not on file  Social History Narrative  . Not on file   Social Determinants of Health   Financial Resource Strain: Low Risk   . Difficulty of Paying Living Expenses: Not hard at all  Food Insecurity: No Food Insecurity  . Worried About Running Out of Food in the Last Year: Never true  . Ran Out of Food in the Last Year: Never true  Transportation Needs: No Transportation Needs  . Lack of Transportation (Medical): No  . Lack of Transportation (Non-Medical): No  Physical Activity: Inactive  . Days of Exercise per Week: 0 days  . Minutes of Exercise per Session: 0 min  Stress: No Stress Concern Present  . Feeling of Stress : Not at all  Social Connections: Moderately Isolated  . Frequency of Communication with Friends and Family: More than three times a week  . Frequency of Social Gatherings with Friends and Family: More than three times a  week  . Attends Religious Services: Never  . Active Member of Clubs or Organizations: No  . Attends Club or Organization Meetings: Never  . Marital Status: Living with partner  Intimate Partner Violence: Not At Risk  . Fear of Current or Ex-Partner: No  . Emotionally Abused: No  . Physically Abused: No  . Sexually Abused: No     Review of Systems: General: negative for chills, fever, night sweats or weight changes.  Cardiovascular: negative for chest pain, dyspnea on exertion, edema, orthopnea, palpitations, paroxysmal nocturnal dyspnea or shortness of breath Dermatological: negative for rash Respiratory: negative for cough or wheezing Urologic: negative for hematuria Abdominal: negative for nausea, vomiting, diarrhea, bright red blood per rectum, melena, or hematemesis Neurologic:   negative for visual changes, syncope, or dizziness All other systems reviewed and are otherwise negative except as noted above.    Blood pressure (!) 146/68, pulse 94, height 5' 6" (1.676 m), weight 179 lb (81.2 kg).  General appearance: alert and no distress Neck: no adenopathy, no carotid bruit, no JVD, supple, symmetrical, trachea midline and thyroid not enlarged, symmetric, no tenderness/mass/nodules Lungs: clear to auscultation bilaterally Heart: regular rate and rhythm, S1, S2 normal, no murmur, click, rub or gallop Extremities: extremities normal, atraumatic, no cyanosis or edema Pulses: 2+ and symmetric Skin: Skin color, texture, turgor normal. No rashes or lesions Neurologic: Alert and oriented X 3, normal strength and tone. Normal symmetric reflexes. Normal coordination and gait  EKG not performed today  ASSESSMENT AND PLAN:   PAD (peripheral artery disease) (HCC) Mr. Vara returns today for follow-up.  He continues to have lifestyle limiting right calf claudication and wishes to proceed with angiography and endovascular therapy.  He has mild claudication on the left and Dopplers to  suggest mild to moderate disease as well.      Ivor Kishi J. Mehek Grega MD FACP,FACC,FAHA, FSCAI 01/20/2020 2:25 PM 

## 2020-01-20 NOTE — H&P (View-Only) (Signed)
01/20/2020 John [name]   1945/06/23  [id number]  Primary Physician [name], FNP Primary Cardiologist: Lorretta Harp MD FACP, [place], [place], [place]  HPI:  John [name] is a 74 y.o.  mild to moderately overweight single Caucasian male father of 74, grandfather of 6 grandchildren who is accompanied by his significant other John [name] today.  He was referred by Bernerd Pho, PA-C for evaluation of lifestyle limiting claudication.  I last saw him in the office 12/07/2019 His risk factors include 60-pack-year tobacco abuse continue to smoke 1 pack/day recalcitrant risk factor modification despite having been diagnosed with lung cancer.  History of hypertension, diabetes and hyperlipidemia all treated medically.  There is no family history for CAD the.  Is never had a heart attack or stroke.  He does complain of shortness of breath most likely related to COPD.  He has coronary calcifications on his chest CT performed a year ago and it Myoview stress test that was low risk and nonischemic.  He does complain of right calf claudication over the last 12 months with recent Doppler studies performed 10/26/2019 revealing right ABI 0.72 and a left of 0.99 with a high-frequency signal in his distal right SFA most likely amenable to  endovascular therapy.  Since I saw him 6 weeks ago he continues to complain of right calf claudication wishes to proceed with endovascular therapy.  He does smoke we talked about the importance of smoking cessation.   Current Meds  Medication Sig  . albuterol (VENTOLIN HFA) 108 (90 Base) MCG/ACT inhaler Inhale 2 puffs into the lungs every 4 (four) hours as needed for wheezing or shortness of breath.  Marland Kitchen amLODipine (NORVASC) 5 MG tablet Take 1 tablet (5 mg total) by mouth daily.  Marland Kitchen aspirin EC 81 MG tablet Take 81 mg by mouth daily.  . Blood Glucose Monitoring Suppl (ONETOUCH VERIO REFLECT) w/Device KIT Use to test blood sugars daily as directed. DX: E11.9  . cloNIDine  (CATAPRES) 0.1 MG tablet TAKE 1 TABLET (0.1 MG TOTAL) BY MOUTH 3 (THREE) TIMES DAILY.  . fluticasone (FLONASE) 50 MCG/ACT nasal spray Place 2 sprays into both nostrils daily.  . furosemide (LASIX) 20 MG tablet Take 1 tablet (20 mg total) by mouth 2 (two) times daily.  Marland Kitchen gabapentin (NEURONTIN) 300 MG capsule Take 1 capsule (300 mg total) by mouth 3 (three) times daily.  Marland Kitchen glucose blood (ONETOUCH VERIO) test strip Use to test blood sugars daily as directed. DX: E11.9  . levothyroxine (SYNTHROID) 75 MCG tablet Take 1 tablet (75 mcg total) by mouth daily.  Marland Kitchen linaclotide (LINZESS) 72 MCG capsule Take 1 capsule (72 mcg total) by mouth daily before breakfast.  . meloxicam (MOBIC) 7.5 MG tablet TAKE 1 TABLET (7.5 MG TOTAL) BY MOUTH DAILY AS NEEDED FOR PAIN (BACK PAIN).  Marland Kitchen metFORMIN (GLUCOPHAGE-XR) 500 MG 24 hr tablet TAKE 1 TABLET BY MOUTH EVERY DAY WITH BREAKFAST  . metoprolol succinate (TOPROL XL) 25 MG 24 hr tablet Take 0.5 tablets (12.5 mg total) by mouth daily.  Marland Kitchen olmesartan (BENICAR) 40 MG tablet Take 1 tablet (40 mg total) by mouth daily.  . ondansetron (ZOFRAN ODT) 4 MG disintegrating tablet Take 1 tablet (4 mg total) by mouth every 8 (eight) hours as needed for nausea or vomiting.  Glory Rosebush Delica Lancets 45Y MISC Use to test blood sugars daily as directed. DX: E11.9  . polyethylene glycol powder (GLYCOLAX/MIRALAX) 17 GM/SCOOP powder Take 17 g by mouth daily as needed.  . pravastatin (  PRAVACHOL) 40 MG tablet Take 1 tablet (40 mg total) by mouth every 3 (three) days. M-W-F  . prochlorperazine (COMPAZINE) 10 MG tablet Take 1 tablet (10 mg total) by mouth every 6 (six) hours as needed for nausea or vomiting.  . Semaglutide (RYBELSUS) 7 MG TABS Take 7 mg by mouth every morning.  . tamsulosin (FLOMAX) 0.4 MG CAPS capsule TAKE 2 CAPSULES BY MOUTH EVERY DAY  . temazepam (RESTORIL) 15 MG capsule Take 15 mg by mouth at bedtime as needed for sleep.  Marland Kitchen zolpidem (AMBIEN) 5 MG tablet TAKE 1 TABLET (5 MG  TOTAL) BY MOUTH AT BEDTIME AS NEEDED FOR SLEEP.  . [DISCONTINUED] dutasteride (AVODART) 0.5 MG capsule TAKE 1 CAPSULE BY MOUTH EVERY DAY  . [DISCONTINUED] Fluticasone-Umeclidin-Vilant (TRELEGY ELLIPTA) 100-62.5-25 MCG/INH AEPB One click each am     Allergies  Allergen Reactions  . Jardiance [Empagliflozin]     FEELS SLUGGISH, TIRED  . Lopressor [Metoprolol]     Fatigue    Social History   Socioeconomic History  . Marital status: Divorced    Spouse name: Not on file  . Number of children: 5  . Years of education: 10  . Highest education level: 10th grade  Occupational History  . Occupation: Retired  Tobacco Use  . Smoking status: Current Every Day Smoker    Packs/day: 0.50    Years: 55.00    Pack years: 27.50    Types: Cigarettes    Start date: 03/11/1961  . Smokeless tobacco: Never Used  . Tobacco comment: 1/2 pack to 1 pack per day 12/14/19  Vaping Use  . Vaping Use: Never used  Substance and Sexual Activity  . Alcohol use: No  . Drug use: No  . Sexual activity: Yes    Birth control/protection: None  Other Topics Concern  . Not on file  Social History Narrative  . Not on file   Social Determinants of Health   Financial Resource Strain: Low Risk   . Difficulty of Paying Living Expenses: Not hard at all  Food Insecurity: No Food Insecurity  . Worried About Charity fundraiser in the Last Year: Never true  . Ran Out of Food in the Last Year: Never true  Transportation Needs: No Transportation Needs  . Lack of Transportation (Medical): No  . Lack of Transportation (Non-Medical): No  Physical Activity: Inactive  . Days of Exercise per Week: 0 days  . Minutes of Exercise per Session: 0 min  Stress: No Stress Concern Present  . Feeling of Stress : Not at all  Social Connections: Moderately Isolated  . Frequency of Communication with Friends and Family: More than three times a week  . Frequency of Social Gatherings with Friends and Family: More than three times a  week  . Attends Religious Services: Never  . Active Member of Clubs or Organizations: No  . Attends Archivist Meetings: Never  . Marital Status: Living with partner  Intimate Partner Violence: Not At Risk  . Fear of Current or Ex-Partner: No  . Emotionally Abused: No  . Physically Abused: No  . Sexually Abused: No     Review of Systems: General: negative for chills, fever, night sweats or weight changes.  Cardiovascular: negative for chest pain, dyspnea on exertion, edema, orthopnea, palpitations, paroxysmal nocturnal dyspnea or shortness of breath Dermatological: negative for rash Respiratory: negative for cough or wheezing Urologic: negative for hematuria Abdominal: negative for nausea, vomiting, diarrhea, bright red blood per rectum, melena, or hematemesis Neurologic:  negative for visual changes, syncope, or dizziness All other systems reviewed and are otherwise negative except as noted above.    Blood pressure (!) 146/68, pulse 94, height 5' 6" (1.676 m), weight 179 lb (81.2 kg).  General appearance: alert and no distress Neck: no adenopathy, no carotid bruit, no JVD, supple, symmetrical, trachea midline and thyroid not enlarged, symmetric, no tenderness/mass/nodules Lungs: clear to auscultation bilaterally Heart: regular rate and rhythm, S1, S2 normal, no murmur, click, rub or gallop Extremities: extremities normal, atraumatic, no cyanosis or edema Pulses: 2+ and symmetric Skin: Skin color, texture, turgor normal. No rashes or lesions Neurologic: Alert and oriented X 3, normal strength and tone. Normal symmetric reflexes. Normal coordination and gait  EKG not performed today  ASSESSMENT AND PLAN:   PAD (peripheral artery disease) [facility]) Mr. Verastegui returns today for follow-up.  He continues to have lifestyle limiting right calf claudication and wishes to proceed with angiography and endovascular therapy.  He has mild claudication on the left and Dopplers to  suggest mild to moderate disease as well.      Lorretta Harp MD FACP,FACC,FAHA, [facility] 01/20/2020 2:25 PM

## 2020-01-21 LAB — BASIC METABOLIC PANEL
BUN/Creatinine Ratio: 17 (ref 10–24)
BUN: 14 mg/dL (ref 8–27)
CO2: 25 mmol/L (ref 20–29)
Calcium: 9.3 mg/dL (ref 8.6–10.2)
Chloride: 103 mmol/L (ref 96–106)
Creatinine, Ser: 0.81 mg/dL (ref 0.76–1.27)
GFR calc Af Amer: 102 mL/min/{1.73_m2} (ref >59)
GFR calc non Af Amer: 88 mL/min/{1.73_m2} (ref >59)
Glucose: 104 mg/dL — ABNORMAL HIGH (ref 65–99)
Potassium: 4 mmol/L (ref 3.5–5.2)
Sodium: 141 mmol/L (ref 134–144)

## 2020-01-21 LAB — CBC
Hematocrit: 42 % (ref 37.5–51.0)
Hemoglobin: 14.3 g/dL (ref 13.0–17.7)
MCH: 31.7 pg (ref 26.6–33.0)
MCHC: 34 g/dL (ref 31.5–35.7)
MCV: 93 fL (ref 79–97)
Platelets: 304 10*3/uL (ref 150–450)
RBC: 4.51 x10E6/uL (ref 4.14–5.80)
RDW: 13.1 % (ref 11.6–15.4)
WBC: 7 10*3/uL (ref 3.4–10.8)

## 2020-01-27 ENCOUNTER — Encounter: Payer: Self-pay | Admitting: *Deleted

## 2020-01-31 DIAGNOSIS — Z23 Encounter for immunization: Secondary | ICD-10-CM | POA: Diagnosis not present

## 2020-02-01 ENCOUNTER — Ambulatory Visit (INDEPENDENT_AMBULATORY_CARE_PROVIDER_SITE_OTHER): Payer: Medicare Other

## 2020-02-01 ENCOUNTER — Other Ambulatory Visit: Payer: Self-pay

## 2020-02-01 DIAGNOSIS — Z23 Encounter for immunization: Secondary | ICD-10-CM

## 2020-02-02 ENCOUNTER — Other Ambulatory Visit (HOSPITAL_COMMUNITY): Payer: Medicare Other

## 2020-02-02 ENCOUNTER — Telehealth: Payer: Self-pay | Admitting: *Deleted

## 2020-02-02 ENCOUNTER — Other Ambulatory Visit (HOSPITAL_COMMUNITY)
Admission: RE | Admit: 2020-02-02 | Discharge: 2020-02-02 | Disposition: A | Discharge status: Home / Self Care | Payer: Medicare Other | Source: Ambulatory Visit | Attending: Cardiovascular Disease | Admitting: Cardiovascular Disease

## 2020-02-02 DIAGNOSIS — Z20822 Contact with and (suspected) exposure to covid-19: Secondary | ICD-10-CM | POA: Diagnosis not present

## 2020-02-02 DIAGNOSIS — Z01812 Encounter for preprocedural laboratory examination: Secondary | ICD-10-CM | POA: Insufficient documentation

## 2020-02-02 DIAGNOSIS — L97521 Non-pressure chronic ulcer of other part of left foot limited to breakdown of skin: Secondary | ICD-10-CM | POA: Diagnosis not present

## 2020-02-02 LAB — SARS CORONAVIRUS 2 (TAT 6-24 HRS): SARS Coronavirus 2: NEGATIVE

## 2020-02-02 NOTE — Telephone Encounter (Addendum)
Pt contacted pre-abdominal aortogram scheduled at [facility] for: Monday February 06, 2020 7:30 AM Verified arrival time and place: [facility]) at: 5:30 AM   No solid food after midnight prior to cath, clear liquids until 5 AM day of procedure.  Hold: No diabetes medications AM of procedure:   Metformin-day of procedure and 48 hours post procedure   Rybelsus-AM of procedure Lasix-AM of procedure  Except hold medications AM meds can be  taken pre-cath with sips of water including: ASA 81 mg   Confirmed patient has responsible adult to drive home post procedure and be with patient first 24 hours after arriving home: yes  You are allowed ONE visitor in the waiting room during the time you are at the hospital for your procedure. Both you and your visitor must wear a mask once you enter the hospital.  Reviewed procedure/mask/visitor instructions, COVID-19 questions reviewed with patient.

## 2020-02-06 ENCOUNTER — Other Ambulatory Visit: Payer: Self-pay

## 2020-02-06 ENCOUNTER — Encounter (HOSPITAL_COMMUNITY)
Admission: RE | Disposition: A | Discharge status: Home / Self Care | Payer: Self-pay | Source: Home / Self Care | Attending: Cardiovascular Disease

## 2020-02-06 ENCOUNTER — Telehealth: Payer: Self-pay | Admitting: Cardiovascular Disease

## 2020-02-06 ENCOUNTER — Ambulatory Visit (HOSPITAL_COMMUNITY)
Admission: RE | Admit: 2020-02-06 | Discharge: 2020-02-06 | Disposition: A | Discharge status: Home / Self Care | Payer: Medicare Other | Attending: Cardiovascular Disease | Admitting: Cardiovascular Disease

## 2020-02-06 ENCOUNTER — Encounter (HOSPITAL_COMMUNITY): Payer: Self-pay | Admitting: Cardiovascular Disease

## 2020-02-06 ENCOUNTER — Emergency Department (HOSPITAL_COMMUNITY)
Admission: EM | Admit: 2020-02-06 | Discharge: 2020-02-06 | Disposition: A | Discharge status: Home / Self Care | Payer: Medicare Other | Source: Home / Self Care | Attending: Emergency Medicine | Admitting: Emergency Medicine

## 2020-02-06 DIAGNOSIS — Z85118 Personal history of other malignant neoplasm of bronchus and lung: Secondary | ICD-10-CM | POA: Insufficient documentation

## 2020-02-06 DIAGNOSIS — Z79899 Other long term (current) drug therapy: Secondary | ICD-10-CM | POA: Insufficient documentation

## 2020-02-06 DIAGNOSIS — F17210 Nicotine dependence, cigarettes, uncomplicated: Secondary | ICD-10-CM | POA: Insufficient documentation

## 2020-02-06 DIAGNOSIS — N3000 Acute cystitis without hematuria: Secondary | ICD-10-CM | POA: Insufficient documentation

## 2020-02-06 DIAGNOSIS — Z7984 Long term (current) use of oral hypoglycemic drugs: Secondary | ICD-10-CM | POA: Insufficient documentation

## 2020-02-06 DIAGNOSIS — I739 Peripheral vascular disease, unspecified: Secondary | ICD-10-CM | POA: Diagnosis present

## 2020-02-06 DIAGNOSIS — I110 Hypertensive heart disease with heart failure: Secondary | ICD-10-CM | POA: Insufficient documentation

## 2020-02-06 DIAGNOSIS — E785 Hyperlipidemia, unspecified: Secondary | ICD-10-CM | POA: Diagnosis not present

## 2020-02-06 DIAGNOSIS — Z888 Allergy status to other drugs, medicaments and biological substances status: Secondary | ICD-10-CM | POA: Diagnosis not present

## 2020-02-06 DIAGNOSIS — E039 Hypothyroidism, unspecified: Secondary | ICD-10-CM | POA: Insufficient documentation

## 2020-02-06 DIAGNOSIS — I509 Heart failure, unspecified: Secondary | ICD-10-CM | POA: Insufficient documentation

## 2020-02-06 DIAGNOSIS — I70213 Atherosclerosis of native arteries of extremities with intermittent claudication, bilateral legs: Secondary | ICD-10-CM | POA: Diagnosis not present

## 2020-02-06 DIAGNOSIS — I10 Essential (primary) hypertension: Secondary | ICD-10-CM | POA: Diagnosis not present

## 2020-02-06 DIAGNOSIS — E1140 Type 2 diabetes mellitus with diabetic neuropathy, unspecified: Secondary | ICD-10-CM | POA: Insufficient documentation

## 2020-02-06 DIAGNOSIS — Z7982 Long term (current) use of aspirin: Secondary | ICD-10-CM | POA: Insufficient documentation

## 2020-02-06 DIAGNOSIS — R338 Other retention of urine: Secondary | ICD-10-CM

## 2020-02-06 DIAGNOSIS — J45909 Unspecified asthma, uncomplicated: Secondary | ICD-10-CM | POA: Insufficient documentation

## 2020-02-06 DIAGNOSIS — E1151 Type 2 diabetes mellitus with diabetic peripheral angiopathy without gangrene: Secondary | ICD-10-CM | POA: Diagnosis not present

## 2020-02-06 DIAGNOSIS — R52 Pain, unspecified: Secondary | ICD-10-CM | POA: Diagnosis not present

## 2020-02-06 DIAGNOSIS — R339 Retention of urine, unspecified: Secondary | ICD-10-CM | POA: Diagnosis not present

## 2020-02-06 DIAGNOSIS — R5381 Other malaise: Secondary | ICD-10-CM | POA: Diagnosis not present

## 2020-02-06 HISTORY — PX: ABDOMINAL AORTOGRAM W/LOWER EXTREMITY: CATH118223

## 2020-02-06 LAB — URINALYSIS, ROUTINE W REFLEX MICROSCOPIC
Bacteria, UA: NONE SEEN
Bilirubin Urine: NEGATIVE
Glucose, UA: NEGATIVE mg/dL
Ketones, ur: NEGATIVE mg/dL
Leukocytes,Ua: NEGATIVE
Nitrite: NEGATIVE
Protein, ur: 100 mg/dL — AB
RBC / HPF: >50 RBC/hpf — ABNORMAL HIGH (ref 0–5)
Specific Gravity, Urine: 1.034 — ABNORMAL HIGH (ref 1.005–1.030)
WBC, UA: >50 WBC/hpf — ABNORMAL HIGH (ref 0–5)
pH: 6 (ref 5.0–8.0)

## 2020-02-06 LAB — CBC
HCT: 47.4 % (ref 39.0–52.0)
Hemoglobin: 15.2 g/dL (ref 13.0–17.0)
MCH: 30.8 pg (ref 26.0–34.0)
MCHC: 32.1 g/dL (ref 30.0–36.0)
MCV: 96 fL (ref 80.0–100.0)
Platelets: 317 10*3/uL (ref 150–400)
RBC: 4.94 MIL/uL (ref 4.22–5.81)
RDW: 13.9 % (ref 11.5–15.5)
WBC: 12.3 10*3/uL — ABNORMAL HIGH (ref 4.0–10.5)
nRBC: 0 % (ref 0.0–0.2)

## 2020-02-06 LAB — BASIC METABOLIC PANEL
Anion gap: 11 (ref 5–15)
BUN: 14 mg/dL (ref 8–23)
CO2: 22 mmol/L (ref 22–32)
Calcium: 9 mg/dL (ref 8.9–10.3)
Chloride: 100 mmol/L (ref 98–111)
Creatinine, Ser: 0.93 mg/dL (ref 0.61–1.24)
GFR, Estimated: >60 mL/min (ref >60)
Glucose, Bld: 178 mg/dL — ABNORMAL HIGH (ref 70–99)
Potassium: 3.8 mmol/L (ref 3.5–5.1)
Sodium: 133 mmol/L — ABNORMAL LOW (ref 135–145)

## 2020-02-06 LAB — GLUCOSE, CAPILLARY: Glucose-Capillary: 158 mg/dL — ABNORMAL HIGH (ref 70–99)

## 2020-02-06 SURGERY — ABDOMINAL AORTOGRAM W/LOWER EXTREMITY
Anesthesia: LOCAL | Laterality: Left

## 2020-02-06 MED ORDER — SODIUM CHLORIDE 0.9 % IV SOLN: INTRAVENOUS | Status: DC

## 2020-02-06 MED ORDER — SODIUM CHLORIDE 0.9 % IV SOLN: 250.0000 mL | INTRAVENOUS | Status: DC | PRN

## 2020-02-06 MED ORDER — HEPARIN (PORCINE) IN NACL 1000-0.9 UT/500ML-% IV SOLN
INTRAVENOUS | Status: AC
  Filled 2020-02-06: qty 1000

## 2020-02-06 MED ORDER — LIDOCAINE HCL (PF) 1 % IJ SOLN
INTRAMUSCULAR | Status: DC | PRN
  Administered 2020-02-06: 25 mL via INTRADERMAL

## 2020-02-06 MED ORDER — NITROGLYCERIN 1 MG/10 ML FOR IR/CATH LAB
INTRA_ARTERIAL | Status: AC
  Filled 2020-02-06: qty 10

## 2020-02-06 MED ORDER — MIDAZOLAM HCL 2 MG/2ML IJ SOLN
INTRAMUSCULAR | Status: DC | PRN
  Administered 2020-02-06: 1 mg via INTRAVENOUS

## 2020-02-06 MED ORDER — NITROGLYCERIN 1 MG/10 ML FOR IR/CATH LAB
INTRA_ARTERIAL | Status: DC | PRN
  Administered 2020-02-06: 200 ug via INTRA_ARTERIAL

## 2020-02-06 MED ORDER — SODIUM CHLORIDE 0.9% FLUSH: 3.0000 mL | INTRAVENOUS | Status: DC | PRN

## 2020-02-06 MED ORDER — IODIXANOL 320 MG/ML IV SOLN
INTRAVENOUS | Status: DC | PRN
  Administered 2020-02-06: 180 mL via INTRA_ARTERIAL

## 2020-02-06 MED ORDER — MIDAZOLAM HCL 2 MG/2ML IJ SOLN
INTRAMUSCULAR | Status: AC
  Filled 2020-02-06: qty 2

## 2020-02-06 MED ORDER — SODIUM CHLORIDE 0.9 % WEIGHT BASED INFUSION: 1.0000 mL/kg/h | INTRAVENOUS | Status: DC

## 2020-02-06 MED ORDER — SODIUM CHLORIDE 0.9% FLUSH: 3.0000 mL | Freq: Two times a day (BID) | INTRAVENOUS | Status: DC

## 2020-02-06 MED ORDER — LIDOCAINE HCL (PF) 1 % IJ SOLN
2.1000 mL | Freq: Once | INTRAMUSCULAR | Status: AC
  Administered 2020-02-06: 2.1 mL
  Filled 2020-02-06: qty 30

## 2020-02-06 MED ORDER — LIDOCAINE HCL (PF) 1 % IJ SOLN
INTRAMUSCULAR | Status: AC
  Filled 2020-02-06: qty 30

## 2020-02-06 MED ORDER — HYDRALAZINE HCL 20 MG/ML IJ SOLN: 5.0000 mg | INTRAMUSCULAR | Status: DC | PRN

## 2020-02-06 MED ORDER — HEPARIN SODIUM (PORCINE) 1000 UNIT/ML IJ SOLN
INTRAMUSCULAR | Status: AC
  Filled 2020-02-06: qty 1

## 2020-02-06 MED ORDER — SODIUM CHLORIDE 0.9 % IV SOLN
1.0000 g | Freq: Once | INTRAVENOUS | Status: DC
  Filled 2020-02-06: qty 10

## 2020-02-06 MED ORDER — CEPHALEXIN 500 MG PO CAPS: 500.0000 mg | ORAL_CAPSULE | Freq: Two times a day (BID) | ORAL | 0 refills | Status: AC

## 2020-02-06 MED ORDER — FENTANYL CITRATE (PF) 100 MCG/2ML IJ SOLN
INTRAMUSCULAR | Status: DC | PRN
  Administered 2020-02-06: 25 ug via INTRAVENOUS

## 2020-02-06 MED ORDER — HEPARIN (PORCINE) IN NACL 1000-0.9 UT/500ML-% IV SOLN
INTRAVENOUS | Status: DC | PRN
  Administered 2020-02-06 (×2): 500 mL

## 2020-02-06 MED ORDER — ONDANSETRON HCL 4 MG/2ML IJ SOLN: 4.0000 mg | Freq: Four times a day (QID) | INTRAMUSCULAR | Status: DC | PRN

## 2020-02-06 MED ORDER — ACETAMINOPHEN 325 MG PO TABS: 650.0000 mg | ORAL_TABLET | ORAL | Status: DC | PRN

## 2020-02-06 MED ORDER — SODIUM CHLORIDE 0.9 % WEIGHT BASED INFUSION
3.0000 mL/kg/h | INTRAVENOUS | Status: AC
  Administered 2020-02-06: 3 mL/kg/h via INTRAVENOUS

## 2020-02-06 MED ORDER — ASPIRIN 81 MG PO CHEW: 81.0000 mg | CHEWABLE_TABLET | ORAL | Status: AC

## 2020-02-06 MED ORDER — CEFTRIAXONE SODIUM 1 G IJ SOLR
1.0000 g | Freq: Once | INTRAMUSCULAR | Status: AC
  Administered 2020-02-06: 1 g via INTRAMUSCULAR
  Filled 2020-02-06: qty 10

## 2020-02-06 MED ORDER — ASPIRIN EC 81 MG PO TBEC: 81.0000 mg | DELAYED_RELEASE_TABLET | Freq: Every day | ORAL | Status: DC

## 2020-02-06 MED ORDER — FENTANYL CITRATE (PF) 100 MCG/2ML IJ SOLN
INTRAMUSCULAR | Status: AC
  Filled 2020-02-06: qty 2

## 2020-02-06 MED ORDER — LABETALOL HCL 5 MG/ML IV SOLN: 10.0000 mg | INTRAVENOUS | Status: DC | PRN

## 2020-02-06 SURGICAL SUPPLY — 9 items
CATH ANGIO 5F PIGTAIL 65CM (CATHETERS) ×2 IMPLANT
CATH STRAIGHT 5FR 65CM (CATHETERS) ×2 IMPLANT
KIT PV (KITS) ×2 IMPLANT
SHEATH PINNACLE 5F 10CM (SHEATH) ×2 IMPLANT
SHEATH PROBE COVER 6X72 (BAG) ×2 IMPLANT
SYR MEDRAD MARK 7 150ML (SYRINGE) ×2 IMPLANT
TRANSDUCER W/STOPCOCK (MISCELLANEOUS) ×2 IMPLANT
TRAY PV CATH (CUSTOM PROCEDURE TRAY) ×2 IMPLANT
WIRE HITORQ VERSACORE ST 145CM (WIRE) ×2 IMPLANT

## 2020-02-06 NOTE — Telephone Encounter (Signed)
Called pt back to convey Dr. Kennon Holter advice. Spoke with pt's friend (okay per DPR). Friend states that pt had called EMS for inability to urinate and that they were on site talking to pt at the time of my call. Per friend, pt will go to [facility] for workup.

## 2020-02-06 NOTE — Telephone Encounter (Signed)
Returned pt's call. Pt is S/P abdominal aortogram with lower extremity run off with Dr. Gwenlyn Found 11/1. Pt states during recovery from the aortogram he developed severe abdominal pain and was unable to urinate. Pt states he was able to urinate normally this a.m. prior to his procedure at 0730. Pt states he had a bowel movement in recovery, but that did not alleviate his abdominal pain. Pt states he took Flomax at approximately 1 p.m. today.  Pt states pain in lower in the abdomen, over bladder area. Mildly tender to touch. Rates pain "20/10." Complains of mild nausea. Denies shortness of breath, chest pain. States groin site used for today's procedure is not bothering him.   Pt advised that info would be forwarded to Dr. Gwenlyn Found for review. Advised pt that he should go to local urgent care for evaluation and potential catheterization. Pt verbalizes understanding. Pt is attended by a friend, Randell Loop, who will transport him to urgent care.

## 2020-02-06 NOTE — ED Triage Notes (Signed)
Pt arrived by [facility] for unable to urinate. Pt had an abd aortogram this morning at 730 am and has been unable to urinate since 630 am.

## 2020-02-06 NOTE — Telephone Encounter (Signed)
Patient said he went to the Urgent Care but he was advised by Urgent Care to go to the ER. Pt does not want to go to the ER but does not want to go He will probably end up going to [facility] but he would still like Dr. Kennon Holter advice.

## 2020-02-06 NOTE — ED Notes (Addendum)
Bladder scan volume 544 mL

## 2020-02-06 NOTE — Discharge Instructions (Signed)
Femoral Site Care This sheet gives you information about how to care for yourself after your procedure. Your health care provider may also give you more specific instructions. If you have problems or questions, contact your health care provider. What can I expect after the procedure? After the procedure, it is common to have:  Bruising that usually fades within 1-2 weeks.  Tenderness at the site. Follow these instructions at home: Wound care 1. May remove bandage after 24 hours. 2. Do not take baths, swim, or use a hot tub for 5 days. 3. You may shower 24-48 hours after the procedure. ? Gently wash the site with plain soap and water. ? Pat the area dry with a clean towel. ? Do not rub the site. This may cause bleeding. 4. Do not apply powder or lotion to the site. Keep the site clean and dry. 5. Check your femoral site every day for signs of infection. Check for: ? Redness, swelling, or pain. ? Fluid or blood. ? Warmth. ? Pus or a bad smell. Activity 1. For the first 2-3 days after your procedure, or as long as directed: ? Avoid climbing stairs as much as possible. ? Do not squat. 2. Do not lift, push or pull anything that is heavier than 10 lb for 5 days. 3. Rest as directed. ? Avoid sitting for a long time without moving. Get up to take short walks every 1-2 hours. 4. Do not drive for 24 hours. General instructions  Take over-the-counter and prescription medicines only as told by your health care provider.  Keep all follow-up visits as told by your health care provider. This is important.  DRINK PLENTY OF FLUIDS FOR THE NEXT 2-3 DAYS. Contact a health care provider if you have:  A fever or chills.  You have redness, swelling, or pain around your insertion site. Get help right away if:  The catheter insertion area swells very fast.  You pass out.  You suddenly start to sweat or your skin gets clammy.  The catheter insertion area is bleeding, and the bleeding does  not stop when you hold steady pressure on the area.  The area near or just beyond the catheter insertion site becomes pale, cool, tingly, or numb. These symptoms may represent a serious problem that is an emergency. Do not wait to see if the symptoms will go away. Get medical help right away. Call your local emergency services (911 in the [place] Do not drive yourself to the hospital. Summary  After the procedure, it is common to have bruising that usually fades within 1-2 weeks.  Check your femoral site every day for signs of infection.  Do not lift, push or pull anything that is heavier than 10 lb for 5 days.  This information is not intended to replace advice given to you by your health care provider. Make sure you discuss any questions you have with your health care provider. Document Revised: 04/06/2017 Document Reviewed: 04/06/2017 Elsevier Patient Education  2020 Elsevier Inc[employer] 

## 2020-02-06 NOTE — ED Provider Notes (Addendum)
[facility] EMERGENCY DEPARTMENT Provider Note   CSN: [id number] Arrival date & time: 02/06/20  1757     History Chief Complaint  Patient presents with  . Urinary Retention    John [name] is a 74 y.o. male who presents emergency department chief complaint of urinary retention.  Patient states that he has not urinated since 9:30 AM this morning.  He feels urgency to urinate.  He has a history of prostate issues but states he has never had retention before.  He denies flank pain, fever, nausea, vomiting.  HPI     Past Medical History:  Diagnosis Date  . Adenocarcinoma of lung, right ([place]) 04/18/2016  . Anxiety   . Arthritis   . Asthma   . CHF (congestive heart failure) ([place])   . COPD (chronic obstructive pulmonary disease) ([place])   . Depression   . Diabetes mellitus without complication (HCC)    no meds  . DM (diabetes mellitus) ([place]) 07/09/2016  . Dyspnea   . History of kidney stones   . Hyperlipidemia   . Hypertension   . Macular degeneration   . Neuropathy   . Non-small cell lung cancer, right ([place]) 04/18/2016  . Prostatitis   . Pulmonary nodule, left 07/16/2016  . Sleep apnea    cpap    Patient Active Problem List   Diagnosis Date Noted  . COPD GOLD III/ group D still smoking  10/24/2019  . PAD (peripheral artery disease) ([place]) 09/13/2019  . Obstructive sleep apnea treated with continuous positive airway pressure (CPAP) 05/31/2018  . Diabetic neuropathy ([place]) 12/29/2017  . Cigarette smoker 12/29/2017  . Hyperlipidemia associated with type 2 diabetes mellitus ([place]) 12/29/2017  . Primary insomnia 12/29/2017  . Primary osteoarthritis involving multiple joints 12/29/2017  . OSA (obstructive sleep apnea) 12/10/2017  . Hypothyroidism 06/09/2017  . Joint pain 08/05/2016  . Bilateral hand swelling 08/05/2016  . Hyponatremia 08/05/2016  . Pulmonary nodule, left 07/16/2016  . CHF (congestive heart failure) ([place]) 07/09/2016  . DM (diabetes mellitus) ([place]) 07/09/2016    . Non-small cell lung cancer, right ([place]) 04/18/2016  . Essential hypertension 02/01/2016  . COPD (chronic obstructive pulmonary disease) ([place]) 02/01/2016  . BPH (benign prostatic hyperplasia) 02/01/2016    Past Surgical History:  Procedure Laterality Date  . ABDOMINAL AORTOGRAM W/LOWER EXTREMITY Left 02/06/2020   Procedure: ABDOMINAL AORTOGRAM W/LOWER EXTREMITY;  Surgeon: Lorretta Harp, MD;  Location: [place] [facility];  Service: Cardiovascular;  Laterality: Left;  . CATARACT EXTRACTION, BILATERAL Bilateral   . NO PAST SURGERIES    . PORTACATH PLACEMENT Left 06/13/2016   Procedure: INSERTION PORT-A-CATH;  Surgeon: Aviva Signs, MD;  Location: [facility];  Service: General;  Laterality: Left;  Marland Kitchen VIDEO BRONCHOSCOPY WITH ENDOBRONCHIAL NAVIGATION N/A 05/28/2016   Procedure: VIDEO BRONCHOSCOPY WITH ENDOBRONCHIAL NAVIGATION;  Surgeon: [name], MD;  Location: [place];  Service: Thoracic;  Laterality: N/A;  . VIDEO BRONCHOSCOPY WITH ENDOBRONCHIAL ULTRASOUND N/A 05/28/2016   Procedure: VIDEO BRONCHOSCOPY WITH ENDOBRONCHIAL ULTRASOUND;  Surgeon: [name], MD;  Location: [facility];  Service: Thoracic;  Laterality: N/A;       Family History  Problem Relation Age of Onset  . Hypertension Mother   . Diabetes Father   . Heart disease Father   . Stroke Father   . Hypertension Sister     Social History   Tobacco Use  . Smoking status: Current Every Day Smoker    Packs/day: 0.50    Years: 55.00    Pack years:  27.50    Types: Cigarettes    Start date: 03/11/1961  . Smokeless tobacco: Never Used  . Tobacco comment: 1/2 pack to 1 pack per day 12/14/19  Vaping Use  . Vaping Use: Never used  Substance Use Topics  . Alcohol use: No  . Drug use: No    Home Medications Prior to Admission medications   Medication Sig Start Date End Date Taking? Authorizing Provider  albuterol (VENTOLIN HFA) 108 (90 Base) MCG/ACT inhaler Inhale 2 puffs into the lungs every 4 (four) hours as  needed for wheezing or shortness of breath. 12/14/19   Tanda Rockers, MD  amLODipine (NORVASC) 5 MG tablet Take 1 tablet (5 mg total) by mouth daily. 04/25/19   Evelina Dun A, FNP  aspirin EC 81 MG tablet Take 81 mg by mouth daily.    [provider]  Blood Glucose Monitoring Suppl (ONETOUCH VERIO REFLECT) w/Device KIT Use to test blood sugars daily as directed. DX: E11.9 10/14/19   Evelina Dun A, FNP  cloNIDine (CATAPRES) 0.1 MG tablet TAKE 1 TABLET (0.1 MG TOTAL) BY MOUTH 3 (THREE) TIMES DAILY. 12/19/19   Evelina Dun A, FNP  dutasteride (AVODART) 0.5 MG capsule Take 0.5 mg by mouth daily.    [provider]  fluticasone (FLONASE) 50 MCG/ACT nasal spray Place 2 sprays into both nostrils daily. Patient taking differently: Place 2 sprays into both nostrils daily as needed (allergies.).  12/07/15   Wardell Honour, MD  furosemide (LASIX) 20 MG tablet Take 1 tablet (20 mg total) by mouth 2 (two) times daily. Patient taking differently: Take 20 mg by mouth daily as needed (feet/leg swelling (fluid retention)).  04/25/19   [name], FNP  gabapentin (NEURONTIN) 300 MG capsule Take 1 capsule (300 mg total) by mouth 3 (three) times daily. Patient taking differently: Take 300-600 mg by mouth See admin instructions. Take 1 capsule (300 mg) by mouth in the morning & take 2 capsules (600 mg) by mouth in the evening. 12/16/19   Evelina Dun A, FNP  glucose blood (ONETOUCH VERIO) test strip Use to test blood sugars daily as directed. DX: E11.9 10/14/19   [name], FNP  levothyroxine (SYNTHROID) 75 MCG tablet Take 1 tablet (75 mcg total) by mouth daily. 04/25/19   [name], FNP  linaclotide Rolan Lipa) 72 MCG capsule Take 1 capsule (72 mcg total) by mouth daily before breakfast. Patient not taking: Reported on 01/30/2020 01/10/20   Evelina Dun A, FNP  meloxicam (MOBIC) 7.5 MG tablet TAKE 1 TABLET (7.5 MG TOTAL) BY MOUTH DAILY AS NEEDED FOR PAIN (BACK PAIN). 12/22/19    [name], FNP  metFORMIN (GLUCOPHAGE-XR) 500 MG 24 hr tablet TAKE 1 TABLET BY MOUTH EVERY DAY WITH BREAKFAST Patient taking differently: Take 500 mg by mouth daily with breakfast.  01/16/20   Loman Brooklyn, FNP  metoprolol succinate (TOPROL XL) 25 MG 24 hr tablet Take 0.5 tablets (12.5 mg total) by mouth daily. Patient taking differently: Take 12.5 mg by mouth daily as needed (AFIB).  11/18/19   Strader, Fransisco Hertz, PA-C  olmesartan (BENICAR) 40 MG tablet Take 1 tablet (40 mg total) by mouth daily. 10/24/19   Tanda Rockers, MD  ondansetron (ZOFRAN ODT) 4 MG disintegrating tablet Take 1 tablet (4 mg total) by mouth every 8 (eight) hours as needed for nausea or vomiting. 12/16/19   [name], FNP  OneTouch Delica Lancets 77O MISC Use to test blood sugars daily as directed.  DX: E11.9 10/14/19   Evelina Dun A, FNP  polyethylene glycol powder (GLYCOLAX/MIRALAX) 17 GM/SCOOP powder Take 17 g by mouth daily as needed. Patient taking differently: Take 17 g by mouth daily as needed (constipation.).  12/23/19   Dettinger, Fransisca Kaufmann, MD  pravastatin (PRAVACHOL) 40 MG tablet Take 1 tablet (40 mg total) by mouth every 3 (three) days. M-W-F Patient taking differently: Take 40 mg by mouth every Monday, Wednesday, and Friday. At night 04/25/19   Evelina Dun A, FNP  prochlorperazine (COMPAZINE) 10 MG tablet Take 1 tablet (10 mg total) by mouth every 6 (six) hours as needed for nausea or vomiting. Patient not taking: Reported on 01/30/2020 04/25/19   [name], FNP  Semaglutide (RYBELSUS) 7 MG TABS Take 7 mg by mouth every morning. Patient taking differently: Take 7 mg by mouth daily.  11/16/19   [name], FNP  tamsulosin (FLOMAX) 0.4 MG CAPS capsule TAKE 2 CAPSULES BY MOUTH EVERY DAY Patient taking differently: Take 0.4 mg by mouth in the morning and at bedtime.  11/14/19   Hawks, Christy A, FNP  TRELEGY ELLIPTA 100-62.5-25 MCG/INH AEPB Inhale 1 puff into the lungs daily. 01/28/20    [provider]  zolpidem (AMBIEN) 5 MG tablet TAKE 1 TABLET (5 MG TOTAL) BY MOUTH AT BEDTIME AS NEEDED FOR SLEEP. 11/29/19   Lockamy, Randi L, NP-C    Allergies    Jardiance [empagliflozin] and Lopressor [metoprolol]  Review of Systems   Review of Systems  Ten systems reviewed and are negative for acute change, except as noted in the HPI.   Physical Exam Updated Vital Signs BP (!) 148/90 (BP Location: Left Arm)   Pulse (!) 111   Temp 98.2 F (36.8 C) (Oral)   Resp 16   Ht 5' 6"  (1.676 m)   Wt 82.1 kg   SpO2 95%   BMI 29.21 kg/m   Physical Exam Vitals and nursing note reviewed.  Constitutional:      General: He is not in acute distress.    Appearance: He is well-developed. He is not diaphoretic.     Comments: Appears uncomfortable  HENT:     Head: Normocephalic and atraumatic.  Eyes:     General: No scleral icterus.    Conjunctiva/sclera: Conjunctivae normal.  Cardiovascular:     Rate and Rhythm: Normal rate and regular rhythm.     Heart sounds: Normal heart sounds.  Pulmonary:     Effort: Pulmonary effort is normal. No respiratory distress.     Breath sounds: Normal breath sounds.  Abdominal:     Palpations: Abdomen is soft.     Tenderness: There is no abdominal tenderness.     Comments: Distended lower abdomen  Musculoskeletal:     Cervical back: Normal range of motion and neck supple.  Skin:    General: Skin is warm and dry.  Neurological:     Mental Status: He is alert.  Psychiatric:        Behavior: Behavior normal.     ED Results / Procedures / Treatments   Labs (all labs ordered are listed, but only abnormal results are displayed) Labs Reviewed  CBC - Abnormal; Notable for the following components:      Result Value   WBC 12.3 (*)    All other components within normal limits  BASIC METABOLIC PANEL - Abnormal; Notable for the following components:   Sodium 133 (*)    Glucose, Bld 178 (*)    All other components  within normal limits    URINALYSIS, ROUTINE W REFLEX MICROSCOPIC    EKG None  Radiology PERIPHERAL VASCULAR CATHETERIZATION  Result Date: 02/06/2020  [id number] LOCATION:  FACILITY: [facility] PHYSICIAN: Quay Burow, M.D. 02-18-1946 DATE OF PROCEDURE:  02/06/2020 DATE OF DISCHARGE: PV Angiogram/Intervention History obtained from chart review.John [name] is a 74 y.o.  mild to moderately overweight single Caucasian male father of 33, grandfather of 6 grandchildren who is accompanied by his significant other [name] today. He was referred by Bernerd Pho, PA-C for evaluation of lifestyle limiting claudication.  I last saw him in the office 9/1/2021His risk factors include 60-pack-year tobacco abuse continue to smoke 1 pack/day recalcitrant risk factor modification despite having been diagnosed with lung cancer. History of hypertension, diabetes and hyperlipidemia all treated medically. There is no family history for CAD the. Is never had a heart attack or stroke. He does complain of shortness of breath most likely related to COPD. He has coronary calcifications on his chest CT performed a year ago and it Myoview stress test that was low risk and nonischemic. He does complain of right calf claudication over the last 12 months with recent Doppler studies performed 10/26/2019 revealing right ABI 0.72 and a left of 0.99 with a high-frequency signal in his distal right SFA most likely amenable to  endovascular therapy.  Since I saw him 6 weeks ago he continues to complain of right calf claudication wishes to proceed with endovascular therapy.  He does smoke we talked about the importance of smoking cessation. Pre Procedure Diagnosis: Claudication/PAD Post Procedure Diagnosis: Claudication/PAD Operators: Dr. Quay Burow Procedures Performed:  1.  Ultrasound-guided left common femoral access  2.  Abdominal aortogram/bilateral iliac angiogram/bifemoral runoff  3.  Mynx closure left common femoral artery  PROCEDURE DESCRIPTION: The  patient was brought to the second floor [facility] [facility] cath lab in the the postabsorptive state. He was premedicated with IV Versed and fentanyl. His left groin was prepped and shaved in usual sterile fashion. Xylocaine 1% was used for local anesthesia. A 5 French sheath was inserted into the left common femoral artery using standard Seldinger technique.  Ultrasound was used to obtain left common femoral access.  A digital image of this was captured and placed the patient's chart.  A 5 French pigtail catheter was placed in the distal abdominal aorta.  Distal abdominal aortography, bilateral iliac angiography with bifemoral runoff was performed using bolus chase, digital subtraction and step table technique.  Omnipaque dye was used for the entirety of the case.  Retroaortic pressures monitored during the case.  A pullback gradient was performed across the left common iliac artery using a 5 French straight endhole catheter after ministration of 200 mcg of intra-arterial nitroglycerin demonstrating no significant gradient.  A left common femoral angiogram was performed and a MYNX closure device was successfully deployed achieving hemostasis. Angiographic Data: 1: Abdominal aorta-moderately atherosclerotic 2: Left lower extremity-50% calcified ostial/eccentric left common iliac artery stenosis without a pullback gradient noted.  There was three-vessel runoff 3: Right lower extremity-95% calcified ostial right common iliac artery stenosis, 90% eccentric calcified right common femoral artery stenosis, 95% ostial right profunda stenosis.  95% mid right SFA, 80% right tibioperoneal trunk with three-vessel runoff.   Mr. Kulish has high-grade calcified ostial right common iliac stenosis followed by high-grade right common femoral and profunda stenosis and mid right SFA stenosis.  I believe he will need endarterectomy and patch angioplasty as well as profunda angioplasty of his right common femoral artery and profunda  performed surgically after which I plan to perform orbital atherectomy and stenting of his right common iliac artery followed by staged right SFA intervention from the contralateral approach should he have continued claudication.  A left common femoral angiogram was performed and a Mynx closure device was successfully deployed.  The patient left lab in stable condition.  He will be discharged home today as an outpatient and will follow up with me later this week for discussion about revascularization strategy. Quay Burow. MD, [employer] 02/06/2020 8:38 AM    Procedures Procedures (including critical care time)  Medications Ordered in ED Medications - No data to display  ED Course  I have reviewed the triage vital signs and the nursing notes.  Pertinent labs & imaging results that were available during my care of the patient were reviewed by me and considered in my medical decision making (see chart for details).    MDM Rules/Calculators/A&P                          Patient here with acute urinary retention.  Greater than 500 mL of urine found on the patient's bladder scan. Foley catheter placed and patient had immediate relief of symptoms.  I ordered and reviewed labs which include CBC which shows elevated white blood cell count at 12.3, BMP which shows elevated blood glucose, normal renal function.  Urinalysis is positive for infection.  Treated in the emergency department with IM Rocephin 1 g.  Discharged with Keflex.  Patient given ambulatory referral to urology.  He will be discharged with catheter in place and leg bag.  Discussed return precautions.  Final Clinical Impression(s) / ED Diagnoses Final diagnoses:  Acute urinary retention  Acute cystitis without hematuria    Rx / DC Orders ED Discharge Orders    None       Margarita Mail, PA-C 02/06/20 2135    Margarita Mail, PA-C 02/06/20 2141    Daleen Bo, MD 02/07/20 1226

## 2020-02-06 NOTE — Telephone Encounter (Signed)
Patient had an Abdominal Aortogram w/ Lower Extremity procedure done today by Dr. Gwenlyn Found. He states that ever since this his stomach has been hurting real bad and he cant pee. He states he feels like he has to pee but when he goes nothing happens. Please advise.

## 2020-02-06 NOTE — Interval H&P Note (Signed)
History and Physical Interval Note:  02/06/2020 7:34 AM  Starr [place]  has presented today for surgery, with the diagnosis of PAD.  The various methods of treatment have been discussed with the patient and family. After consideration of risks, benefits and other options for treatment, the patient has consented to  Procedure(s): ABDOMINAL AORTOGRAM W/LOWER EXTREMITY (Left) as a surgical intervention.  The patient's history has been reviewed, patient examined, no change in status, stable for surgery.  I have reviewed the patient's chart and labs.  Questions were answered to the patient's satisfaction.     Quay Burow

## 2020-02-06 NOTE — Discharge Instructions (Signed)
Contact a health care provider if:  Your symptoms do not get better after 1-2 days.  Your symptoms go away and then return.  Get help right away if you have:  Severe pain in your back or your lower abdomen.  A fever.  Nausea or vomiting.

## 2020-02-06 NOTE — Telephone Encounter (Signed)
If he can't urinate he'll need to go to the ER

## 2020-02-06 NOTE — ED Notes (Signed)
Leg bag applied, pt educated on catheter emptying, keeping bag below bladder, not disconnecting the bag or pulling catheter etc.  Pt has no further questions

## 2020-02-07 ENCOUNTER — Telehealth: Payer: Self-pay

## 2020-02-07 NOTE — Telephone Encounter (Signed)
Transition Care Management Unsuccessful Follow-up Telephone Call  Date of discharge and from where:  02/06/2020  Attempts:  1st Attempt  Reason for unsuccessful TCM follow-up call:  Left voice message

## 2020-02-08 NOTE — Telephone Encounter (Signed)
Transition Care Management Unsuccessful Follow-up Telephone Call  Date of discharge and from where:  11/01/201- [facility]  Attempts:  2nd Attempt  Reason for unsuccessful TCM follow-up call:  Left voice message

## 2020-02-13 ENCOUNTER — Ambulatory Visit (HOSPITAL_COMMUNITY): Payer: Medicare Other

## 2020-02-13 ENCOUNTER — Encounter (HOSPITAL_COMMUNITY): Payer: Medicare Other

## 2020-02-13 NOTE — Progress Notes (Signed)
Subjective: 1. Urinary retention   2. BPH with urinary obstruction      [name] is a former patient who was last seen in 7/18 for f/u of his history of BPH with trilobar hyperplasia on cystoscopy with Dr. Marica Otter.   He had angiography and developed retention after the procedure.  He had some voiding symptoms prior to the procedure.  He has minimal nocturia.  He had variable hesitancy and generally had a reduced stream.  He had no hematuria but had mild dysuria on occasion.   He has been on tamsulosin bid and dutasteride but was seen in the ED on 11/1 with retention of urine with >558m and a possible UTI that was treated with rocephin and keflex but a culture doesn't appear to have been done.  He had a PET/CT in 8/21 for f/u of his history of lung cancer and prostatomegaly was noted.   No recent PSA's are available.   He had a foley placed and returns for further evaluation.   ROS:  ROS  Allergies  Allergen Reactions  . Jardiance [Empagliflozin]     FEELS SLUGGISH, TIRED  . Lopressor [Metoprolol]     Fatigue    Past Medical History:  Diagnosis Date  . Adenocarcinoma of lung, right ([facility] 04/18/2016  . Anxiety   . Arthritis   . Asthma   . CHF (congestive heart failure) ([facility] [place]   . COPD (chronic obstructive pulmonary disease) ([facility]   . Depression   . Diabetes mellitus without complication (HCC)    no meds  . DM (diabetes mellitus) ([facility] 07/09/2016  . Dyspnea   . History of kidney stones   . Hyperlipidemia   . Hypertension   . Macular degeneration   . Neuropathy   . Non-small cell lung cancer, right ([facility] 04/18/2016  . Prostatitis   . Pulmonary nodule, left 07/16/2016  . Sleep apnea    cpap    Past Surgical History:  Procedure Laterality Date  . ABDOMINAL AORTOGRAM W/LOWER EXTREMITY Left 02/06/2020   Procedure: ABDOMINAL AORTOGRAM W/LOWER EXTREMITY;  Surgeon: BLorretta Harp MD;  [facility];  Service: Cardiovascular;  Laterality: Left;  . CATARACT  EXTRACTION, BILATERAL Bilateral   . NO PAST SURGERIES    . PORTACATH PLACEMENT Left 06/13/2016   Procedure: INSERTION PORT-A-CATH;  Surgeon: MAviva Signs MD;  Location: [facility];  Service: General;  Laterality: Left;  .Marland KitchenVIDEO BRONCHOSCOPY WITH ENDOBRONCHIAL NAVIGATION N/A 05/28/2016   Procedure: VIDEO BRONCHOSCOPY WITH ENDOBRONCHIAL NAVIGATION;  Surgeon: SMelrose Nakayama MD;  [place] [facility]  Service: Thoracic;  Laterality: N/A;  . VIDEO BRONCHOSCOPY WITH ENDOBRONCHIAL ULTRASOUND N/A 05/28/2016   Procedure: VIDEO BRONCHOSCOPY WITH ENDOBRONCHIAL ULTRASOUND;  Surgeon: SMelrose Nakayama MD;  [place] [facility]  Service: Thoracic;  Laterality: N/A;    Social History   Socioeconomic History  . Marital status: Divorced    Spouse name: Not on file  . Number of children: 5  . Years of education: 10  . Highest education level: 10th grade  Occupational History  . Occupation: Retired  Tobacco Use  . Smoking status: Current Every Day Smoker    Packs/day: 0.50    Years: 55.00    Pack years: 27.50    Types: Cigarettes    Start date: 03/11/1961  . Smokeless tobacco: Never Used  . Tobacco comment: 1/2 pack to 1 pack per day 12/14/19  Vaping Use  . Vaping Use: Never used  Substance and Sexual Activity  . Alcohol use: No  .  Drug use: No  . Sexual activity: Yes    Birth control/protection: None  Other Topics Concern  . Not on file  Social History Narrative  . Not on file   Social Determinants of Health   Financial Resource Strain: Low Risk   . Difficulty of Paying Living Expenses: Not hard at all  Food Insecurity: No Food Insecurity  . Worried About Charity fundraiser in the Last Year: Never true  . Ran Out of Food in the Last Year: Never true  Transportation Needs: No Transportation Needs  . Lack of Transportation (Medical): No  . Lack of Transportation (Non-Medical): No  Physical Activity: Inactive  . Days of Exercise per Week: 0 days  . Minutes of Exercise per Session: 0 min   Stress: No Stress Concern Present  . Feeling of Stress : Not at all  Social Connections: Moderately Isolated  . Frequency of Communication with Friends and Family: More than three times a week  . Frequency of Social Gatherings with Friends and Family: More than three times a week  . Attends Religious Services: Never  . Active Member of Clubs or Organizations: No  . Attends Archivist Meetings: Never  . Marital Status: Living with partner  Intimate Partner Violence: Not At Risk  . Fear of Current or Ex-Partner: No  . Emotionally Abused: No  . Physically Abused: No  . Sexually Abused: No    Family History  Problem Relation Age of Onset  . Hypertension Mother   . Diabetes Father   . Heart disease Father   . Stroke Father   . Hypertension Sister     Anti-infectives: Anti-infectives (From admission, onward)   None      Current Outpatient Medications  Medication Sig Dispense Refill  . amLODipine (NORVASC) 5 MG tablet Take 1 tablet (5 mg total) by mouth daily. 90 tablet 3  . aspirin EC 81 MG tablet Take 81 mg by mouth daily.    . Blood Glucose Monitoring Suppl (ONETOUCH VERIO REFLECT) w/Device KIT Use to test blood sugars daily as directed. DX: E11.9 1 kit 1  . cloNIDine (CATAPRES) 0.1 MG tablet TAKE 1 TABLET (0.1 MG TOTAL) BY MOUTH 3 (THREE) TIMES DAILY. 270 tablet 1  . dutasteride (AVODART) 0.5 MG capsule Take 0.5 mg by mouth daily.    . fluticasone (FLONASE) 50 MCG/ACT nasal spray Place 2 sprays into both nostrils daily. (Patient taking differently: Place 2 sprays into both nostrils daily as needed (allergies.). ) 16 g 6  . furosemide (LASIX) 20 MG tablet Take 1 tablet (20 mg total) by mouth 2 (two) times daily. (Patient taking differently: Take 20 mg by mouth daily as needed (feet/leg swelling (fluid retention)). ) 180 tablet 1  . gabapentin (NEURONTIN) 300 MG capsule Take 1 capsule (300 mg total) by mouth 3 (three) times daily. (Patient taking differently: Take  300-600 mg by mouth See admin instructions. Take 1 capsule (300 mg) by mouth in the morning & take 2 capsules (600 mg) by mouth in the evening.) 270 capsule 2  . glucose blood (ONETOUCH VERIO) test strip Use to test blood sugars daily as directed. DX: E11.9 100 each 12  . levothyroxine (SYNTHROID) 75 MCG tablet Take 1 tablet (75 mcg total) by mouth daily. 90 tablet 3  . linaclotide (LINZESS) 72 MCG capsule Take 1 capsule (72 mcg total) by mouth daily before breakfast. 30 capsule 2  . meloxicam (MOBIC) 7.5 MG tablet TAKE 1 TABLET (7.5 MG TOTAL) BY MOUTH  DAILY AS NEEDED FOR PAIN (BACK PAIN). 90 tablet 0  . metFORMIN (GLUCOPHAGE-XR) 500 MG 24 hr tablet TAKE 1 TABLET BY MOUTH EVERY DAY WITH BREAKFAST (Patient taking differently: Take 500 mg by mouth daily with breakfast. ) 90 tablet 0  . metoprolol succinate (TOPROL XL) 25 MG 24 hr tablet Take 0.5 tablets (12.5 mg total) by mouth daily. (Patient taking differently: Take 12.5 mg by mouth daily as needed (AFIB). ) 45 tablet 3  . olmesartan (BENICAR) 40 MG tablet Take 1 tablet (40 mg total) by mouth daily. 30 tablet 11  . ondansetron (ZOFRAN ODT) 4 MG disintegrating tablet Take 1 tablet (4 mg total) by mouth every 8 (eight) hours as needed for nausea or vomiting. 20 tablet 0  . OneTouch Delica Lancets 23T MISC Use to test blood sugars daily as directed. DX: E11.9 100 each 1  . polyethylene glycol powder (GLYCOLAX/MIRALAX) 17 GM/SCOOP powder Take 17 g by mouth daily as needed. (Patient taking differently: Take 17 g by mouth daily as needed (constipation.). ) 3350 g 1  . pravastatin (PRAVACHOL) 40 MG tablet Take 1 tablet (40 mg total) by mouth every 3 (three) days. M-W-F (Patient taking differently: Take 40 mg by mouth every Monday, Wednesday, and Friday. At night) 90 tablet 1  . prochlorperazine (COMPAZINE) 10 MG tablet Take 1 tablet (10 mg total) by mouth every 6 (six) hours as needed for nausea or vomiting. 60 tablet 2  . Semaglutide (RYBELSUS) 7 MG TABS Take  7 mg by mouth every morning. (Patient taking differently: Take 7 mg by mouth daily. ) 90 tablet 2  . tamsulosin (FLOMAX) 0.4 MG CAPS capsule TAKE 2 CAPSULES BY MOUTH EVERY DAY (Patient taking differently: Take 0.4 mg by mouth in the morning and at bedtime. ) 180 capsule 1  . TRELEGY ELLIPTA 100-62.5-25 MCG/INH AEPB Inhale 1 puff into the lungs daily.    Marland Kitchen zolpidem (AMBIEN) 5 MG tablet TAKE 1 TABLET (5 MG TOTAL) BY MOUTH AT BEDTIME AS NEEDED FOR SLEEP. 30 tablet 0  . albuterol (VENTOLIN HFA) 108 (90 Base) MCG/ACT inhaler Inhale 2 puffs into the lungs every 4 (four) hours as needed for wheezing or shortness of breath. (Patient not taking: Reported on 02/14/2020) 8 g 6   No current facility-administered medications for this visit.     Objective: Vital signs in last 24 hours: BP 140/71   Pulse (!) 110   Temp 98.4 F (36.9 C)   Ht 5' 6"  (1.676 m)   Wt 181 lb (82.1 kg)   BMI 29.21 kg/m   Intake/Output from previous day: No intake/output data recorded. Intake/Output this shift: @IOTHISSHIFT @   Physical Exam Vitals reviewed.  Constitutional:      Appearance: Normal appearance.  HENT:     Head: Normocephalic and atraumatic.  Cardiovascular:     Rate and Rhythm: Normal rate and regular rhythm.     Heart sounds: Normal heart sounds.  Pulmonary:     Effort: Pulmonary effort is normal. No respiratory distress.     Breath sounds: Normal breath sounds.  Abdominal:     Palpations: Abdomen is soft. There is no mass.     Tenderness: There is no abdominal tenderness.  Genitourinary:    Comments: Normal phallus with a foley at the meatus. Scrotum, testes and epididymi normal. AP without lesions. NST without mass. Prostate 1.5+ with slight firmness at the right base. SV non-palpable.  Musculoskeletal:     Cervical back: Normal range of motion and neck supple.  Neurological:     Mental Status: He is alert.     Lab Results:  Recent Results (from the past 2160 hour(s))  CBC with  Differential/Platelet     Status: Abnormal   Collection Time: 11/24/19  9:55 AM  Result Value Ref Range   WBC 7.6 4.0 - 10.5 K/uL   RBC 4.34 4.22 - 5.81 MIL/uL   Hemoglobin 14.1 13.0 - 17.0 g/dL   HCT 43.5 39 - 52 %   MCV 100.2 (H) 80.0 - 100.0 fL   MCH 32.5 26.0 - 34.0 pg   MCHC 32.4 30.0 - 36.0 g/dL   RDW 14.2 11.5 - 15.5 %   Platelets 261 150 - 400 K/uL   nRBC 0.0 0.0 - 0.2 %   Neutrophils Relative % 77 %   Neutro Abs 5.9 1.7 - 7.7 K/uL   Lymphocytes Relative 9 %   Lymphs Abs 0.7 0.7 - 4.0 K/uL   Monocytes Relative 11 %   Monocytes Absolute 0.9 0.1 - 1.0 K/uL   Eosinophils Relative 2 %   Eosinophils Absolute 0.2 0.0 - 0.5 K/uL   Basophils Relative 1 %   Basophils Absolute 0.0 0.0 - 0.1 K/uL   Immature Granulocytes 0 %   Abs Immature Granulocytes 0.03 0.00 - 0.07 K/uL    Comment: Performed at [facility], [address] [place], [place] [address]  Comprehensive metabolic panel     Status: Abnormal   Collection Time: 11/24/19  9:55 AM  Result Value Ref Range   Sodium 138 135 - 145 mmol/L   Potassium 4.1 3.5 - 5.1 mmol/L   Chloride 101 98 - 111 mmol/L   CO2 25 22 - 32 mmol/L   Glucose, Bld 205 (H) 70 - 99 mg/dL    Comment: Glucose reference range applies only to samples taken after fasting for at least 8 hours.   BUN 13 8 - 23 mg/dL   Creatinine, Ser 0.75 0.61 - 1.24 mg/dL   Calcium 9.3 8.9 - 10.3 mg/dL   Total Protein 7.4 6.5 - 8.1 g/dL   Albumin 3.8 3.5 - 5.0 g/dL   AST 17 15 - 41 U/L   ALT 15 0 - 44 U/L   Alkaline Phosphatase 96 38 - 126 U/L   Total Bilirubin 0.2 (L) 0.3 - 1.2 mg/dL   GFR calc non Af Amer >60 >60 mL/min   GFR calc Af Amer >60 >60 mL/min   Anion gap 12 5 - 15    Comment: Performed at [facility], [address], [place], [place] [address]  TSH     Status: None   Collection Time: 11/24/19  9:55 AM  Result Value Ref Range   TSH 1.706 0.350 - 4.500 uIU/mL    Comment: Performed by a 3rd Generation assay with a functional sensitivity of <=0.01  uIU/mL. Performed at [facility], [address], [place], [place] [address]   [facility] Hb A1c Waived     Status: Abnormal   Collection Time: 12/16/19 11:06 AM  Result Value Ref Range   HB A1C (BAYER DCA - WAIVED) 7.5 (H) <7.0 %    Comment:                                       Diabetic Adult            <7.0  Healthy Adult        4.3 - 5.7                                                           (DCCT/NGSP) American Diabetes Association's Summary of Glycemic Recommendations for Adults with Diabetes: Hemoglobin A1c <7.0%. More stringent glycemic goals (A1c <6.0%) may further reduce complications at the cost of increased risk of hypoglycemia.   Alpha-1 antitrypsin phenotype     Status: None   Collection Time: 12/28/19  9:21 AM  Result Value Ref Range   A-1 Antitrypsin Pheno MM     Comment: (NOTE)       Phenotype  Population     A-1-AT Concentration*                  Incidence %   % of MM (Typical Range)       MM            86.5%       100%     (96 - 189)       MS             8.0%        86%     (83 - 161)       MZ             3.9%        61%     (60 - 111)       FM             0.4%       100%     (93 - 191)       SZ             0.3%        41%     (42 -  75)       SS             0.1%        64%     (62 - 119)       ZZ             0.05%       19%     (16 -  38)       FS             0.05%       70%     (70 - 128)       FZ            Unknown      46%     (44 -  88)       FF            Unknown                Unknown *A-1-AT concentration in the homozygous MM phenotype is taken as the reference normal. Percent deficiency in each phenotype is reported relative to this reference. Ranges used to confirm phenotype. Performed At: [facility] [place], [place] [id number] Rush Farmer MD HQ:[id number]    A-1 Antitrypsin, Ser 147 101 - 187 mg/dL  Basic metabolic panel     Status: Abnormal   Collection Time: 01/20/20  2:52 PM  Result Value Ref Range   Glucose 104 (H) 65 - 99 mg/dL   BUN 14 8 - 27 mg/dL   Creatinine, Ser 0.81 0.76 - 1.27 mg/dL   GFR calc non Af Amer 88 >59 mL/min/1.73   GFR calc Af Amer 102 >59 mL/min/1.73    Comment: **In accordance with recommendations from the NKF-ASN Task force,**   [facility] is in the process of updating its eGFR calculation to the   2021 CKD-EPI creatinine equation that estimates kidney function   without a race variable.    BUN/Creatinine Ratio 17 10 - 24   Sodium 141 134 - 144 mmol/L   Potassium 4.0 3.5 - 5.2 mmol/L   Chloride 103 96 - 106 mmol/L   CO2 25 20 - 29 mmol/L   Calcium 9.3 8.6 - 10.2 mg/dL  CBC     Status: None   Collection Time: 01/20/20  2:52 PM  Result Value Ref Range   WBC 7.0 3.4 - 10.8 x10E3/uL   RBC 4.51 4.14 - 5.80 x10E6/uL   Hemoglobin 14.3 13.0 - 17.7 g/dL   Hematocrit 42.0 37.5 - 51.0 %   MCV 93 79 - 97 fL   MCH 31.7 26.6 - 33.0 pg   MCHC 34.0 31 - 35 g/dL   RDW 13.1 11.6 - 15.4 %   Platelets 304 150 - 450 x10E3/uL  SARS CORONAVIRUS 2 (TAT 6-24 HRS) Nasopharyngeal Nasopharyngeal Swab     Status: None   Collection Time: 02/02/20 10:00 AM   Specimen: Nasopharyngeal Swab  Result Value Ref Range   SARS Coronavirus 2 NEGATIVE NEGATIVE    Comment: (NOTE) SARS-CoV-2 target nucleic acids are NOT DETECTED.  The SARS-CoV-2 RNA is generally detectable in upper and lower respiratory specimens during the acute phase of infection. Negative results do not preclude SARS-CoV-2 infection, do not rule out co-infections with other pathogens, and should not be used as the sole basis for treatment or other patient management decisions. Negative results must be combined with clinical observations, patient history, and epidemiological information. The expected result is Negative.  Fact Sheet for Patients: SugarRoll.be  Fact Sheet for Healthcare Providers: https://www.woods-mathews.com/  This test is not yet  approved or cleared by the [place] FDA and  has been authorized for detection and/or diagnosis of SARS-CoV-2 by FDA under an Emergency Use Authorization (EUA). This EUA will remain  in effect (meaning this test can be used) for the duration of the COVID-19 declaration under Se ction 564(b)(1) of the Act, 21 U.S.C. section 360bbb-3(b)(1), unless the authorization is terminated or revoked sooner.  Performed at [facility], [place] [address] [place], [place] [address]   Glucose, capillary     Status: Abnormal   Collection Time: 02/06/20  5:46 AM  Result Value Ref Range   Glucose-Capillary 158 (H) 70 - 99 mg/dL    Comment: Glucose reference range applies only to samples taken after fasting for at least 8 hours.   Comment 1 Notify RN   Urinalysis, Routine w reflex microscopic Urine, Catheterized     Status: Abnormal   Collection Time: 02/06/20  6:38 PM  Result Value Ref Range   Color, Urine YELLOW YELLOW   APPearance CLEAR CLEAR   Specific Gravity, Urine 1.034 (H) 1.005 - 1.030   pH 6.0 5.0 - 8.0   Glucose, UA NEGATIVE NEGATIVE mg/dL   Hgb urine dipstick LARGE (A) NEGATIVE   Bilirubin Urine NEGATIVE NEGATIVE   Ketones, ur NEGATIVE NEGATIVE mg/dL   Protein, ur 100 (  A) NEGATIVE mg/dL   Nitrite NEGATIVE NEGATIVE   Leukocytes,Ua NEGATIVE NEGATIVE   RBC / HPF >50 (H) 0 - 5 RBC/hpf   WBC, UA >50 (H) 0 - 5 WBC/hpf   Bacteria, UA NONE SEEN NONE SEEN   Squamous Epithelial / LPF 0-5 0 - 5    Comment: Performed at [facility], [address] [place], [place] [address]  CBC     Status: Abnormal   Collection Time: 02/06/20  7:10 PM  Result Value Ref Range   WBC 12.3 (H) 4.0 - 10.5 K/uL   RBC 4.94 4.22 - 5.81 MIL/uL   Hemoglobin 15.2 13.0 - 17.0 g/dL   HCT 47.4 39 - 52 %   MCV 96.0 80.0 - 100.0 fL   MCH 30.8 26.0 - 34.0 pg   MCHC 32.1 30.0 - 36.0 g/dL   RDW 13.9 11.5 - 15.5 %   Platelets 317 150 - 400 K/uL   nRBC 0.0 0.0 - 0.2 %    Comment: Performed at [facility],  [address] [place], [place] [address]  Basic metabolic panel     Status: Abnormal   Collection Time: 02/06/20  7:10 PM  Result Value Ref Range   Sodium 133 (L) 135 - 145 mmol/L   Potassium 3.8 3.5 - 5.1 mmol/L   Chloride 100 98 - 111 mmol/L   CO2 22 22 - 32 mmol/L   Glucose, Bld 178 (H) 70 - 99 mg/dL    Comment: Glucose reference range applies only to samples taken after fasting for at least 8 hours.   BUN 14 8 - 23 mg/dL   Creatinine, Ser 0.93 0.61 - 1.24 mg/dL   Calcium 9.0 8.9 - 10.3 mg/dL   GFR, Estimated >60 >60 mL/min    Comment: (NOTE) Calculated using the CKD-EPI Creatinine Equation (2021)    Anion gap 11 5 - 15    Comment: Performed at [facility], [address] [place], [place] [address]    Studies/Results: I have reviewed his recent hospital notes, last CT/PET and labs.  His Cr was 0.93.    Assessment/Plan: BPH with retention.  He was able to void on the [facility] this morning but his stream was very slow.   I will have him return this afternoon for a PVR and then in 1-2 weeks if he is emptying. He will continue his current meds.   UTI.  He has completed his cephalexin this morning.   No orders of the defined types were placed in this encounter.    Orders Placed This Encounter  Procedures  . PR COMPLEX UROFLOWMETRY     Return for He needs to return this afternoon for a PVR and then return in 1-2 weeks for reevaluation with PVR. Marland Kitchen    CC: Evelina Dun FNP.     Irine Seal 02/14/2020 [phone]

## 2020-02-14 ENCOUNTER — Other Ambulatory Visit: Payer: Self-pay

## 2020-02-14 ENCOUNTER — Ambulatory Visit (INDEPENDENT_AMBULATORY_CARE_PROVIDER_SITE_OTHER): Payer: Medicare Other | Admitting: Urology

## 2020-02-14 ENCOUNTER — Encounter: Payer: Self-pay | Admitting: Urology

## 2020-02-14 VITALS — BP 140/71 | HR 110 | Temp 98.4°F | Ht 66.0 in | Wt 181.0 lb

## 2020-02-14 DIAGNOSIS — N138 Other obstructive and reflux uropathy: Secondary | ICD-10-CM

## 2020-02-14 DIAGNOSIS — I2510 Atherosclerotic heart disease of native coronary artery without angina pectoris: Secondary | ICD-10-CM | POA: Diagnosis not present

## 2020-02-14 DIAGNOSIS — N390 Urinary tract infection, site not specified: Secondary | ICD-10-CM | POA: Diagnosis not present

## 2020-02-14 DIAGNOSIS — R339 Retention of urine, unspecified: Secondary | ICD-10-CM | POA: Diagnosis not present

## 2020-02-14 DIAGNOSIS — N401 Enlarged prostate with lower urinary tract symptoms: Secondary | ICD-10-CM

## 2020-02-14 LAB — BLADDER SCAN AMB NON-IMAGING
Cath Procedure Report: 550
Scan Result: 465

## 2020-02-14 NOTE — Progress Notes (Signed)
Urological Symptom Review  Patient is experiencing the following symptoms: Frequent urination Burning/pain with urination Trouble starting stream Have to strain to urinate Erection problems (male only)   Review of Systems  Gastrointestinal (upper)  : Nausea  Gastrointestinal (lower) : Constipation  Constitutional : Fatigue  Skin: Negative for skin symptoms  Eyes: Blurred vision  Ear/Nose/Throat : Negative for Ear/Nose/Throat symptoms  Hematologic/Lymphatic: Negative for Hematologic/Lymphatic symptoms  Cardiovascular : Leg swelling  Respiratory : Cough Shortness of breath  Endocrine: Negative for endocrine symptoms  Musculoskeletal: Back pain  Neurological: Negative for neurological symptoms  Psychologic: Negative for psychiatric symptoms

## 2020-02-14 NOTE — Progress Notes (Signed)
Bladder Scan Patient cannot void: 465 ml Performed By: Durenda Guthrie, LPN  Simple Catheter Placement  Due to urinary retention patient is present today for a foley cath placement.  Patient was cleaned and prepped in a sterile fashion with betadine. A 16 coude FR foley catheter was inserted, urine return was noted  10ml, urine was yellow in color.  The balloon was filled with 10cc of sterile water.  A leg bag was attached for drainage.   Patient was given instruction on proper catheter care.  Patient tolerated well, no complications were noted   Performed by: Elmin Wiederholt, lpn   Additional notes/ Follow up: cath pvr was 550 ml. Follow up in 2 wks for voiding trial

## 2020-02-14 NOTE — Progress Notes (Signed)
Uroflow  Peak Flow: 63ml Average Flow: 61ml Voided Volume: 42ml Voiding Time: 19sec Flow Time: 19sec Time to Peak Flow: 2sec  Instilled 130cc sterile water via catheter.

## 2020-02-14 NOTE — Addendum Note (Signed)
Addended byIris Pert on: 02/14/2020 03:18 PM   Modules accepted: Orders

## 2020-02-21 ENCOUNTER — Other Ambulatory Visit: Payer: Self-pay

## 2020-02-21 ENCOUNTER — Ambulatory Visit (INDEPENDENT_AMBULATORY_CARE_PROVIDER_SITE_OTHER): Payer: Medicare Other | Admitting: Cardiovascular Disease

## 2020-02-21 ENCOUNTER — Encounter: Payer: Self-pay | Admitting: Cardiovascular Disease

## 2020-02-21 VITALS — BP 154/84 | HR 113 | Ht 66.0 in | Wt 174.6 lb

## 2020-02-21 DIAGNOSIS — I739 Peripheral vascular disease, unspecified: Secondary | ICD-10-CM | POA: Diagnosis not present

## 2020-02-21 DIAGNOSIS — I2510 Atherosclerotic heart disease of native coronary artery without angina pectoris: Secondary | ICD-10-CM

## 2020-02-21 NOTE — Progress Notes (Signed)
Mr. Digirolamo returns for follow-up.  He underwent peripheral angiography by myself on 02/06/2020 for right lower extremity claudication.  He had a 95% calcified right common iliac artery, 90% right common femoral artery as well as profunda femoris and a 95% mid right SFA.  He has right lower extremity claudication.  I believe he will need right common femoral endarterectomy after which I can perform orbital atherectomy and stenting of his right common iliac artery with staged right SFA intervention.  This procedure was complicated by urinary retention postop requiring an indwelling catheter which hopefully will come out next week.  I am referring him to Dr. Fortunato Curling for right common femoral endarterectomy.  Lorretta Harp, M.D., [place], [facility], [place] [address] [place], [place]  [address]  [phone] 02/21/2020 3:35 PM

## 2020-02-21 NOTE — Patient Instructions (Signed)
Medication Instructions:  Your physician recommends that you continue on your current medications as directed. Please refer to the Current Medication list given to you today.  *If you need a refill on your cardiac medications before your next appointment, please call your pharmacy*  Follow-Up: At [facility], you and your health needs are our priority.  As part of our continuing mission to provide you with exceptional heart care, we have created designated Provider Care Teams.  These Care Teams include your primary Cardiologist (physician) and Advanced Practice Providers (APPs -  Physician Assistants and Nurse Practitioners) who all work together to provide you with the care you need, when you need it.  We recommend signing up for the patient portal called "MyChart".  Sign up information is provided on this After Visit Summary.  MyChart is used to connect with patients for Virtual Visits (Telemedicine).  Patients are able to view lab/test results, encounter notes, upcoming appointments, etc.  Non-urgent messages can be sent to your provider as well.   To learn more about what you can do with MyChart, go to [email or web address].    Your next appointment:   6 week(s)  The format for your next appointment:   In Person  Provider:   Quay Burow, MD

## 2020-02-23 ENCOUNTER — Telehealth: Payer: Self-pay

## 2020-02-23 NOTE — Telephone Encounter (Signed)
Pt called saying he has had a cath for 3 wks. He is having a discharge at the end of his penis and is wondering about it. Pt said he is not hurting nor having any burning. He has no sxs of infection. Discharge is small in size. I told him this is common. There is no bad odor. I explained this should not be a problem. He is scheduled to come in next week. He is to call back if sxs begin.

## 2020-02-28 DIAGNOSIS — L97521 Non-pressure chronic ulcer of other part of left foot limited to breakdown of skin: Secondary | ICD-10-CM | POA: Diagnosis not present

## 2020-02-29 ENCOUNTER — Ambulatory Visit (INDEPENDENT_AMBULATORY_CARE_PROVIDER_SITE_OTHER): Payer: Medicare Other

## 2020-02-29 ENCOUNTER — Other Ambulatory Visit: Payer: Self-pay

## 2020-02-29 DIAGNOSIS — R339 Retention of urine, unspecified: Secondary | ICD-10-CM | POA: Diagnosis not present

## 2020-02-29 NOTE — Progress Notes (Signed)
Fill and Pull Catheter Removal  Patient is present today for a catheter removal.  Patient was cleaned and prepped in a sterile fashion 133ml of sterile water/ saline was instilled into the bladder when the patient felt the urge to urinate. 47ml of water was then drained from the balloon.  A 16FR foley cath was removed from the bladder no complications were noted .  Patient as then given some time to void on their own.  Patient can void  27ml on their own after some time.  Patient tolerated well. D/t pt only voiding 64ccs Dr. Alyson Ingles has pt returning this afternoon for a PVR.  Performed by: Antionette Char, Yatzil Clippinger,LPN   Simple Catheter Placement  Due to urinary retention patient is present today for a foley cath placement.  Patient was cleaned and prepped in a sterile fashion with betadine. A 16 FR foley catheter was inserted, urine return was noted  456ml, urine was red tinged in color.  The balloon was filled with 10cc of sterile water.  A leg bag was attached for drainage.   Patient was given instruction on proper catheter care.  Patient tolerated.  Performed by: Kema Santaella,LPN  Additional notes/ Follow up: Pt c/o burning, pain around abd and odor with urination.

## 2020-03-03 ENCOUNTER — Other Ambulatory Visit: Payer: Self-pay

## 2020-03-03 ENCOUNTER — Emergency Department (HOSPITAL_COMMUNITY): Payer: Medicare Other

## 2020-03-03 ENCOUNTER — Inpatient Hospital Stay (HOSPITAL_COMMUNITY)
Admission: EM | Admit: 2020-03-03 | Discharge: 2020-03-05 | DRG: 698 | Disposition: A | Discharge status: Home Health | Payer: Medicare Other | Attending: Internal Medicine | Admitting: Internal Medicine

## 2020-03-03 ENCOUNTER — Encounter (HOSPITAL_COMMUNITY): Payer: Self-pay

## 2020-03-03 DIAGNOSIS — E785 Hyperlipidemia, unspecified: Secondary | ICD-10-CM | POA: Diagnosis present

## 2020-03-03 DIAGNOSIS — K5909 Other constipation: Secondary | ICD-10-CM | POA: Diagnosis present

## 2020-03-03 DIAGNOSIS — Z7982 Long term (current) use of aspirin: Secondary | ICD-10-CM

## 2020-03-03 DIAGNOSIS — E1151 Type 2 diabetes mellitus with diabetic peripheral angiopathy without gangrene: Secondary | ICD-10-CM | POA: Diagnosis present

## 2020-03-03 DIAGNOSIS — I10 Essential (primary) hypertension: Secondary | ICD-10-CM | POA: Diagnosis present

## 2020-03-03 DIAGNOSIS — Z791 Long term (current) use of non-steroidal anti-inflammatories (NSAID): Secondary | ICD-10-CM

## 2020-03-03 DIAGNOSIS — Z79890 Hormone replacement therapy (postmenopausal): Secondary | ICD-10-CM | POA: Diagnosis not present

## 2020-03-03 DIAGNOSIS — E039 Hypothyroidism, unspecified: Secondary | ICD-10-CM | POA: Diagnosis present

## 2020-03-03 DIAGNOSIS — R0600 Dyspnea, unspecified: Secondary | ICD-10-CM

## 2020-03-03 DIAGNOSIS — R0902 Hypoxemia: Secondary | ICD-10-CM | POA: Diagnosis present

## 2020-03-03 DIAGNOSIS — Y846 Urinary catheterization as the cause of abnormal reaction of the patient, or of later complication, without mention of misadventure at the time of the procedure: Secondary | ICD-10-CM | POA: Diagnosis present

## 2020-03-03 DIAGNOSIS — N390 Urinary tract infection, site not specified: Secondary | ICD-10-CM

## 2020-03-03 DIAGNOSIS — G4733 Obstructive sleep apnea (adult) (pediatric): Secondary | ICD-10-CM | POA: Diagnosis present

## 2020-03-03 DIAGNOSIS — R52 Pain, unspecified: Secondary | ICD-10-CM | POA: Diagnosis not present

## 2020-03-03 DIAGNOSIS — R410 Disorientation, unspecified: Secondary | ICD-10-CM | POA: Diagnosis not present

## 2020-03-03 DIAGNOSIS — A419 Sepsis, unspecified organism: Secondary | ICD-10-CM

## 2020-03-03 DIAGNOSIS — Z7984 Long term (current) use of oral hypoglycemic drugs: Secondary | ICD-10-CM

## 2020-03-03 DIAGNOSIS — Z7951 Long term (current) use of inhaled steroids: Secondary | ICD-10-CM

## 2020-03-03 DIAGNOSIS — T83518A Infection and inflammatory reaction due to other urinary catheter, initial encounter: Secondary | ICD-10-CM | POA: Diagnosis present

## 2020-03-03 DIAGNOSIS — J449 Chronic obstructive pulmonary disease, unspecified: Secondary | ICD-10-CM | POA: Diagnosis present

## 2020-03-03 DIAGNOSIS — Z20822 Contact with and (suspected) exposure to covid-19: Secondary | ICD-10-CM | POA: Diagnosis present

## 2020-03-03 DIAGNOSIS — Z79899 Other long term (current) drug therapy: Secondary | ICD-10-CM

## 2020-03-03 DIAGNOSIS — J189 Pneumonia, unspecified organism: Secondary | ICD-10-CM | POA: Diagnosis not present

## 2020-03-03 DIAGNOSIS — I739 Peripheral vascular disease, unspecified: Secondary | ICD-10-CM

## 2020-03-03 DIAGNOSIS — E1140 Type 2 diabetes mellitus with diabetic neuropathy, unspecified: Secondary | ICD-10-CM

## 2020-03-03 DIAGNOSIS — R338 Other retention of urine: Secondary | ICD-10-CM | POA: Diagnosis present

## 2020-03-03 DIAGNOSIS — A4151 Sepsis due to Escherichia coli [E. coli]: Secondary | ICD-10-CM | POA: Diagnosis present

## 2020-03-03 DIAGNOSIS — N3001 Acute cystitis with hematuria: Secondary | ICD-10-CM | POA: Diagnosis not present

## 2020-03-03 DIAGNOSIS — R404 Transient alteration of awareness: Secondary | ICD-10-CM | POA: Diagnosis not present

## 2020-03-03 DIAGNOSIS — F17210 Nicotine dependence, cigarettes, uncomplicated: Secondary | ICD-10-CM | POA: Diagnosis present

## 2020-03-03 DIAGNOSIS — F32A Depression, unspecified: Secondary | ICD-10-CM | POA: Diagnosis present

## 2020-03-03 DIAGNOSIS — R0689 Other abnormalities of breathing: Secondary | ICD-10-CM | POA: Diagnosis not present

## 2020-03-03 DIAGNOSIS — R1084 Generalized abdominal pain: Secondary | ICD-10-CM | POA: Diagnosis not present

## 2020-03-03 DIAGNOSIS — N401 Enlarged prostate with lower urinary tract symptoms: Secondary | ICD-10-CM | POA: Diagnosis present

## 2020-03-03 DIAGNOSIS — Z85118 Personal history of other malignant neoplasm of bronchus and lung: Secondary | ICD-10-CM | POA: Diagnosis not present

## 2020-03-03 DIAGNOSIS — R000 Tachycardia, unspecified: Secondary | ICD-10-CM | POA: Diagnosis not present

## 2020-03-03 LAB — CBC WITH DIFFERENTIAL/PLATELET
Abs Immature Granulocytes: 0.19 10*3/uL — ABNORMAL HIGH (ref 0.00–0.07)
Basophils Absolute: 0.1 10*3/uL (ref 0.0–0.1)
Basophils Relative: 0 %
Eosinophils Absolute: 0.2 10*3/uL (ref 0.0–0.5)
Eosinophils Relative: 1 %
HCT: 43.2 % (ref 39.0–52.0)
Hemoglobin: 13.8 g/dL (ref 13.0–17.0)
Immature Granulocytes: 1 %
Lymphocytes Relative: 3 %
Lymphs Abs: 0.4 10*3/uL — ABNORMAL LOW (ref 0.7–4.0)
MCH: 30.8 pg (ref 26.0–34.0)
MCHC: 31.9 g/dL (ref 30.0–36.0)
MCV: 96.4 fL (ref 80.0–100.0)
Monocytes Absolute: 1.3 10*3/uL — ABNORMAL HIGH (ref 0.1–1.0)
Monocytes Relative: 9 %
Neutro Abs: 12.6 10*3/uL — ABNORMAL HIGH (ref 1.7–7.7)
Neutrophils Relative %: 86 %
Platelets: 270 10*3/uL (ref 150–400)
RBC: 4.48 MIL/uL (ref 4.22–5.81)
RDW: 14.3 % (ref 11.5–15.5)
WBC: 14.7 10*3/uL — ABNORMAL HIGH (ref 4.0–10.5)
nRBC: 0 % (ref 0.0–0.2)

## 2020-03-03 LAB — COMPREHENSIVE METABOLIC PANEL
ALT: 20 U/L (ref 0–44)
AST: 20 U/L (ref 15–41)
Albumin: 3.8 g/dL (ref 3.5–5.0)
Alkaline Phosphatase: 125 U/L (ref 38–126)
Anion gap: 11 (ref 5–15)
BUN: 21 mg/dL (ref 8–23)
CO2: 26 mmol/L (ref 22–32)
Calcium: 9.1 mg/dL (ref 8.9–10.3)
Chloride: 99 mmol/L (ref 98–111)
Creatinine, Ser: 1.13 mg/dL (ref 0.61–1.24)
GFR, Estimated: >60 mL/min (ref >60)
Glucose, Bld: 146 mg/dL — ABNORMAL HIGH (ref 70–99)
Potassium: 3.2 mmol/L — ABNORMAL LOW (ref 3.5–5.1)
Sodium: 136 mmol/L (ref 135–145)
Total Bilirubin: 0.7 mg/dL (ref 0.3–1.2)
Total Protein: 8 g/dL (ref 6.5–8.1)

## 2020-03-03 LAB — URINALYSIS, ROUTINE W REFLEX MICROSCOPIC
Bilirubin Urine: NEGATIVE
Glucose, UA: NEGATIVE mg/dL
Ketones, ur: NEGATIVE mg/dL
Nitrite: NEGATIVE
Protein, ur: 100 mg/dL — AB
Specific Gravity, Urine: 1.021 (ref 1.005–1.030)
WBC, UA: >50 WBC/hpf — ABNORMAL HIGH (ref 0–5)
pH: 5 (ref 5.0–8.0)

## 2020-03-03 LAB — PHOSPHORUS: Phosphorus: 3.9 mg/dL (ref 2.5–4.6)

## 2020-03-03 LAB — RESP PANEL BY RT-PCR (FLU A&B, COVID) ARPGX2
Influenza A by PCR: NEGATIVE
Influenza B by PCR: NEGATIVE
SARS Coronavirus 2 by RT PCR: NEGATIVE

## 2020-03-03 LAB — PROTIME-INR
INR: 1.1 (ref 0.8–1.2)
Prothrombin Time: 13.5 seconds (ref 11.4–15.2)

## 2020-03-03 LAB — CBG MONITORING, ED
Glucose-Capillary: 153 mg/dL — ABNORMAL HIGH (ref 70–99)
Glucose-Capillary: 113 mg/dL — ABNORMAL HIGH (ref 70–99)
Glucose-Capillary: 124 mg/dL — ABNORMAL HIGH (ref 70–99)

## 2020-03-03 LAB — GLUCOSE, CAPILLARY: Glucose-Capillary: 105 mg/dL — ABNORMAL HIGH (ref 70–99)

## 2020-03-03 LAB — LACTIC ACID, PLASMA
Lactic Acid, Venous: 1 mmol/L (ref 0.5–1.9)
Lactic Acid, Venous: 0.9 mmol/L (ref 0.5–1.9)

## 2020-03-03 LAB — MAGNESIUM: Magnesium: 2.1 mg/dL (ref 1.7–2.4)

## 2020-03-03 LAB — APTT: aPTT: 35 seconds (ref 24–36)

## 2020-03-03 LAB — TSH: TSH: 0.752 u[IU]/mL (ref 0.350–4.500)

## 2020-03-03 MED ORDER — DUTASTERIDE 0.5 MG PO CAPS
0.5000 mg | ORAL_CAPSULE | Freq: Every day | ORAL | Status: DC
  Administered 2020-03-03 – 2020-03-05 (×3): 0.5 mg via ORAL
  Filled 2020-03-03 (×5): qty 1

## 2020-03-03 MED ORDER — ACETAMINOPHEN 325 MG PO TABS
650.0000 mg | ORAL_TABLET | Freq: Four times a day (QID) | ORAL | Status: DC | PRN
  Administered 2020-03-04: 650 mg via ORAL
  Filled 2020-03-03: qty 2

## 2020-03-03 MED ORDER — LACTATED RINGERS IV SOLN: INTRAVENOUS | Status: DC

## 2020-03-03 MED ORDER — ZOLPIDEM TARTRATE 5 MG PO TABS
5.0000 mg | ORAL_TABLET | Freq: Once | ORAL | Status: AC
  Administered 2020-03-03: 5 mg via ORAL
  Filled 2020-03-03: qty 1

## 2020-03-03 MED ORDER — LACTATED RINGERS IV BOLUS (SEPSIS)
1000.0000 mL | Freq: Once | INTRAVENOUS | Status: AC
  Administered 2020-03-03: 1000 mL via INTRAVENOUS

## 2020-03-03 MED ORDER — GABAPENTIN 300 MG PO CAPS
300.0000 mg | ORAL_CAPSULE | Freq: Every day | ORAL | Status: DC
  Administered 2020-03-03 – 2020-03-05 (×3): 300 mg via ORAL
  Filled 2020-03-03 (×3): qty 1

## 2020-03-03 MED ORDER — HEPARIN SODIUM (PORCINE) 5000 UNIT/ML IJ SOLN
5000.0000 [IU] | Freq: Three times a day (TID) | INTRAMUSCULAR | Status: DC
  Administered 2020-03-03 – 2020-03-05 (×8): 5000 [IU] via SUBCUTANEOUS
  Filled 2020-03-03 (×8): qty 1

## 2020-03-03 MED ORDER — ONDANSETRON HCL 4 MG PO TABS: 4.0000 mg | ORAL_TABLET | Freq: Four times a day (QID) | ORAL | Status: DC | PRN

## 2020-03-03 MED ORDER — FLUTICASONE-UMECLIDIN-VILANT 100-62.5-25 MCG/INH IN AEPB: 1.0000 | INHALATION_SPRAY | Freq: Every day | RESPIRATORY_TRACT | Status: DC

## 2020-03-03 MED ORDER — UMECLIDINIUM BROMIDE 62.5 MCG/INH IN AEPB
1.0000 | INHALATION_SPRAY | Freq: Every day | RESPIRATORY_TRACT | Status: DC
  Administered 2020-03-03 – 2020-03-05 (×3): 1 via RESPIRATORY_TRACT
  Filled 2020-03-03: qty 7

## 2020-03-03 MED ORDER — SODIUM CHLORIDE 0.9 % IV SOLN
INTRAVENOUS | Status: DC
  Administered 2020-03-03: 1000 mL via INTRAVENOUS

## 2020-03-03 MED ORDER — INSULIN ASPART 100 UNIT/ML ~~LOC~~ SOLN
0.0000 [IU] | Freq: Three times a day (TID) | SUBCUTANEOUS | Status: DC
  Administered 2020-03-03: 2 [IU] via SUBCUTANEOUS
  Administered 2020-03-03 – 2020-03-04 (×2): 3 [IU] via SUBCUTANEOUS
  Administered 2020-03-04 – 2020-03-05 (×2): 2 [IU] via SUBCUTANEOUS
  Filled 2020-03-03 (×2): qty 1

## 2020-03-03 MED ORDER — FUROSEMIDE 10 MG/ML IJ SOLN
20.0000 mg | Freq: Once | INTRAMUSCULAR | Status: AC
  Administered 2020-03-03: 20 mg via INTRAVENOUS
  Filled 2020-03-03: qty 2

## 2020-03-03 MED ORDER — CHLORHEXIDINE GLUCONATE CLOTH 2 % EX PADS
6.0000 | MEDICATED_PAD | Freq: Every day | CUTANEOUS | Status: DC
  Administered 2020-03-05: 6 via TOPICAL

## 2020-03-03 MED ORDER — IPRATROPIUM-ALBUTEROL 0.5-2.5 (3) MG/3ML IN SOLN: 3.0000 mL | Freq: Four times a day (QID) | RESPIRATORY_TRACT | Status: DC | PRN

## 2020-03-03 MED ORDER — TAMSULOSIN HCL 0.4 MG PO CAPS
0.8000 mg | ORAL_CAPSULE | Freq: Every day | ORAL | Status: DC
  Administered 2020-03-03 – 2020-03-05 (×3): 0.8 mg via ORAL
  Filled 2020-03-03 (×3): qty 2

## 2020-03-03 MED ORDER — GABAPENTIN 300 MG PO CAPS
600.0000 mg | ORAL_CAPSULE | Freq: Every day | ORAL | Status: DC
  Administered 2020-03-03 – 2020-03-04 (×2): 600 mg via ORAL
  Filled 2020-03-03 (×2): qty 2

## 2020-03-03 MED ORDER — ACETAMINOPHEN 650 MG RE SUPP: 650.0000 mg | Freq: Four times a day (QID) | RECTAL | Status: DC | PRN

## 2020-03-03 MED ORDER — ONDANSETRON HCL 4 MG/2ML IJ SOLN
4.0000 mg | Freq: Four times a day (QID) | INTRAMUSCULAR | Status: DC | PRN
  Administered 2020-03-03 – 2020-03-04 (×2): 4 mg via INTRAVENOUS
  Filled 2020-03-03 (×2): qty 2

## 2020-03-03 MED ORDER — INSULIN ASPART 100 UNIT/ML ~~LOC~~ SOLN: 0.0000 [IU] | Freq: Every day | SUBCUTANEOUS | Status: DC

## 2020-03-03 MED ORDER — ACETAMINOPHEN 325 MG PO TABS: 650.0000 mg | ORAL_TABLET | Freq: Once | ORAL | Status: DC

## 2020-03-03 MED ORDER — INSULIN GLARGINE 100 UNIT/ML ~~LOC~~ SOLN
5.0000 [IU] | Freq: Every day | SUBCUTANEOUS | Status: DC
  Administered 2020-03-03 – 2020-03-04 (×2): 5 [IU] via SUBCUTANEOUS
  Filled 2020-03-03 (×4): qty 0.05

## 2020-03-03 MED ORDER — METOPROLOL SUCCINATE ER 25 MG PO TB24
12.5000 mg | ORAL_TABLET | Freq: Every day | ORAL | Status: DC
  Administered 2020-03-03 – 2020-03-05 (×3): 12.5 mg via ORAL
  Filled 2020-03-03 (×3): qty 1

## 2020-03-03 MED ORDER — SEMAGLUTIDE 7 MG PO TABS: 7.0000 mg | ORAL_TABLET | Freq: Every day | ORAL | Status: DC

## 2020-03-03 MED ORDER — IRBESARTAN 150 MG PO TABS
150.0000 mg | ORAL_TABLET | Freq: Every day | ORAL | Status: DC
  Administered 2020-03-03 – 2020-03-05 (×3): 150 mg via ORAL
  Filled 2020-03-03 (×5): qty 1

## 2020-03-03 MED ORDER — ASPIRIN EC 81 MG PO TBEC
81.0000 mg | DELAYED_RELEASE_TABLET | Freq: Every day | ORAL | Status: DC
  Administered 2020-03-03 – 2020-03-05 (×3): 81 mg via ORAL
  Filled 2020-03-03 (×3): qty 1

## 2020-03-03 MED ORDER — IPRATROPIUM-ALBUTEROL 0.5-2.5 (3) MG/3ML IN SOLN
3.0000 mL | Freq: Once | RESPIRATORY_TRACT | Status: AC
  Administered 2020-03-03: 3 mL via RESPIRATORY_TRACT
  Filled 2020-03-03: qty 3

## 2020-03-03 MED ORDER — LINACLOTIDE 72 MCG PO CAPS
72.0000 ug | ORAL_CAPSULE | Freq: Every day | ORAL | Status: DC
  Administered 2020-03-03 – 2020-03-05 (×3): 72 ug via ORAL
  Filled 2020-03-03 (×5): qty 1

## 2020-03-03 MED ORDER — SODIUM CHLORIDE 0.9 % IV SOLN
2.0000 g | Freq: Two times a day (BID) | INTRAVENOUS | Status: DC
  Administered 2020-03-03 – 2020-03-05 (×4): 2 g via INTRAVENOUS
  Filled 2020-03-03 (×4): qty 2

## 2020-03-03 MED ORDER — FLUTICASONE FUROATE-VILANTEROL 100-25 MCG/INH IN AEPB
1.0000 | INHALATION_SPRAY | Freq: Every day | RESPIRATORY_TRACT | Status: DC
  Administered 2020-03-04 – 2020-03-05 (×2): 1 via RESPIRATORY_TRACT
  Filled 2020-03-03: qty 28

## 2020-03-03 MED ORDER — CLONIDINE HCL 0.1 MG PO TABS
0.1000 mg | ORAL_TABLET | Freq: Three times a day (TID) | ORAL | Status: DC
  Administered 2020-03-03 – 2020-03-05 (×8): 0.1 mg via ORAL
  Filled 2020-03-03 (×8): qty 1

## 2020-03-03 MED ORDER — POTASSIUM CHLORIDE CRYS ER 20 MEQ PO TBCR
40.0000 meq | EXTENDED_RELEASE_TABLET | Freq: Once | ORAL | Status: AC
  Administered 2020-03-03: 40 meq via ORAL
  Filled 2020-03-03: qty 2

## 2020-03-03 MED ORDER — GABAPENTIN 300 MG PO CAPS: 300.0000 mg | ORAL_CAPSULE | ORAL | Status: DC

## 2020-03-03 MED ORDER — LACTATED RINGERS IV BOLUS (SEPSIS)
500.0000 mL | Freq: Once | INTRAVENOUS | Status: AC
  Administered 2020-03-03: 500 mL via INTRAVENOUS

## 2020-03-03 MED ORDER — IBUPROFEN 400 MG PO TABS
400.0000 mg | ORAL_TABLET | Freq: Once | ORAL | Status: AC
  Administered 2020-03-03: 400 mg via ORAL
  Filled 2020-03-03: qty 1

## 2020-03-03 MED ORDER — LEVOTHYROXINE SODIUM 75 MCG PO TABS
75.0000 ug | ORAL_TABLET | Freq: Every day | ORAL | Status: DC
  Administered 2020-03-03 – 2020-03-05 (×3): 75 ug via ORAL
  Filled 2020-03-03: qty 2
  Filled 2020-03-03 (×2): qty 1

## 2020-03-03 MED ORDER — SODIUM CHLORIDE 0.9 % IV SOLN
2.0000 g | Freq: Once | INTRAVENOUS | Status: AC
  Administered 2020-03-03: 2 g via INTRAVENOUS
  Filled 2020-03-03: qty 2

## 2020-03-03 NOTE — ED Notes (Signed)
Patient's O2 sat 88-89% on 2 liters. Patient alert with head of bed elevated. Patient coughing. HR 125. Audible wheezing noted. Patient's oxygen increased to 3L via [place]. Dr Dyann Kief made aware, orders to be given.

## 2020-03-03 NOTE — ED Triage Notes (Signed)
Pt arrived via [facility] who were called to Pts residence by his spouse after Pt reportedly fell out of chair. Pt has foley cath in placed from previous visit and 18g IV established prior to arrival. Pt reports recent Hx of infection and thinks he still has UTI.

## 2020-03-03 NOTE — ED Provider Notes (Signed)
[facility] EMERGENCY DEPARTMENT Provider Note   CSN: [id number] Arrival date & time: 03/03/20  0436     History Chief Complaint  Patient presents with  . Fall    John [name] is a 74 y.o. male.  The history is provided by the patient.  Fall This is a new problem. The problem occurs constantly. The problem has not changed since onset.Pertinent negatives include no abdominal pain and no headaches. Nothing aggravates the symptoms. Nothing relieves the symptoms. He has tried nothing for the symptoms.  Patient with history of lung cancer, CHF, COPD, diabetes presents with fever.  Patient reports he slid out of a chair, and his wife checked and noted that he had a fever.  He reports recent cough. He denies any traumatic injury from falling from a chair.  Patient received a Foley catheter placed. He also reports a chronic wound to his left foot.     Past Medical History:  Diagnosis Date  . Adenocarcinoma of lung, right ([place]) 04/18/2016  . Anxiety   . Arthritis   . Asthma   . CHF (congestive heart failure) ([place])   . COPD (chronic obstructive pulmonary disease) ([place])   . Depression   . Diabetes mellitus without complication (HCC)    no meds  . DM (diabetes mellitus) ([place]) 07/09/2016  . Dyspnea   . History of kidney stones   . Hyperlipidemia   . Hypertension   . Macular degeneration   . Neuropathy   . Non-small cell lung cancer, right ([place]) 04/18/2016  . Prostatitis   . Pulmonary nodule, left 07/16/2016  . Sleep apnea    cpap    Patient Active Problem List   Diagnosis Date Noted  . COPD GOLD III/ group D still smoking  10/24/2019  . PAD (peripheral artery disease) ([place]) 09/13/2019  . Obstructive sleep apnea treated with continuous positive airway pressure (CPAP) 05/31/2018  . Diabetic neuropathy ([place]) 12/29/2017  . Cigarette smoker 12/29/2017  . Hyperlipidemia associated with type 2 diabetes mellitus ([place]) 12/29/2017  . Primary insomnia 12/29/2017  . Primary  osteoarthritis involving multiple joints 12/29/2017  . OSA (obstructive sleep apnea) 12/10/2017  . Hypothyroidism 06/09/2017  . Joint pain 08/05/2016  . Bilateral hand swelling 08/05/2016  . Hyponatremia 08/05/2016  . Pulmonary nodule, left 07/16/2016  . CHF (congestive heart failure) ([place]) 07/09/2016  . DM (diabetes mellitus) ([place]) 07/09/2016  . Non-small cell lung cancer, right ([place]) 04/18/2016  . Essential hypertension 02/01/2016  . COPD (chronic obstructive pulmonary disease) ([place]) 02/01/2016  . BPH (benign prostatic hyperplasia) 02/01/2016    Past Surgical History:  Procedure Laterality Date  . ABDOMINAL AORTOGRAM W/LOWER EXTREMITY Left 02/06/2020   Procedure: ABDOMINAL AORTOGRAM W/LOWER EXTREMITY;  Surgeon: Lorretta Harp, MD;  Location: [facility];  Service: Cardiovascular;  Laterality: Left;  . CATARACT EXTRACTION, BILATERAL Bilateral   . NO PAST SURGERIES    . PORTACATH PLACEMENT Left 06/13/2016   Procedure: INSERTION PORT-A-CATH;  Surgeon: Aviva Signs, MD;  Location: [facility];  Service: General;  Laterality: Left;  Marland Kitchen VIDEO BRONCHOSCOPY WITH ENDOBRONCHIAL NAVIGATION N/A 05/28/2016   Procedure: VIDEO BRONCHOSCOPY WITH ENDOBRONCHIAL NAVIGATION;  Surgeon: [name], MD;  Location: [place];  Service: Thoracic;  Laterality: N/A;  . VIDEO BRONCHOSCOPY WITH ENDOBRONCHIAL ULTRASOUND N/A 05/28/2016   Procedure: VIDEO BRONCHOSCOPY WITH ENDOBRONCHIAL ULTRASOUND;  Surgeon: [name], MD;  Location: [facility];  Service: Thoracic;  Laterality: N/A;       Family History  Problem Relation Age of Onset  .  Hypertension Mother   . Diabetes Father   . Heart disease Father   . Stroke Father   . Hypertension Sister     Social History   Tobacco Use  . Smoking status: Current Every Day Smoker    Packs/day: 0.50    Years: 55.00    Pack years: 27.50    Types: Cigarettes    Start date: 03/11/1961  . Smokeless tobacco: Never Used  . Tobacco comment: 1/2 pack to 1  pack per day 12/14/19  Vaping Use  . Vaping Use: Never used  Substance Use Topics  . Alcohol use: No  . Drug use: No    Home Medications Prior to Admission medications   Medication Sig Start Date End Date Taking? Authorizing Provider  albuterol (VENTOLIN HFA) 108 (90 Base) MCG/ACT inhaler Inhale 2 puffs into the lungs every 4 (four) hours as needed for wheezing or shortness of breath. 12/14/19   Tanda Rockers, MD  amLODipine (NORVASC) 5 MG tablet Take 1 tablet (5 mg total) by mouth daily. 04/25/19   Evelina Dun A, FNP  aspirin EC 81 MG tablet Take 81 mg by mouth daily.    [provider]  Blood Glucose Monitoring Suppl (ONETOUCH VERIO REFLECT) w/Device KIT Use to test blood sugars daily as directed. DX: E11.9 10/14/19   Evelina Dun A, FNP  cloNIDine (CATAPRES) 0.1 MG tablet TAKE 1 TABLET (0.1 MG TOTAL) BY MOUTH 3 (THREE) TIMES DAILY. 12/19/19   Evelina Dun A, FNP  dutasteride (AVODART) 0.5 MG capsule Take 0.5 mg by mouth daily.    [provider]  fluticasone (FLONASE) 50 MCG/ACT nasal spray Place 2 sprays into both nostrils daily. Patient taking differently: Place 2 sprays into both nostrils daily as needed (allergies.).  12/07/15   Wardell Honour, MD  furosemide (LASIX) 20 MG tablet Take 1 tablet (20 mg total) by mouth 2 (two) times daily. Patient taking differently: Take 20 mg by mouth daily as needed (feet/leg swelling (fluid retention)).  04/25/19   [name], FNP  gabapentin (NEURONTIN) 300 MG capsule Take 1 capsule (300 mg total) by mouth 3 (three) times daily. Patient taking differently: Take 300-600 mg by mouth See admin instructions. Take 1 capsule (300 mg) by mouth in the morning & take 2 capsules (600 mg) by mouth in the evening. 12/16/19   Evelina Dun A, FNP  glucose blood (ONETOUCH VERIO) test strip Use to test blood sugars daily as directed. DX: E11.9 10/14/19   [name], FNP  levothyroxine (SYNTHROID) 75 MCG tablet Take 1 tablet (75 mcg  total) by mouth daily. 04/25/19   [name], FNP  linaclotide Rolan Lipa) 72 MCG capsule Take 1 capsule (72 mcg total) by mouth daily before breakfast. 01/10/20   Evelina Dun A, FNP  meloxicam (MOBIC) 7.5 MG tablet TAKE 1 TABLET (7.5 MG TOTAL) BY MOUTH DAILY AS NEEDED FOR PAIN (BACK PAIN). 12/22/19   [name], FNP  metFORMIN (GLUCOPHAGE-XR) 500 MG 24 hr tablet TAKE 1 TABLET BY MOUTH EVERY DAY WITH BREAKFAST Patient taking differently: Take 500 mg by mouth daily with breakfast.  01/16/20   Loman Brooklyn, FNP  metoprolol succinate (TOPROL XL) 25 MG 24 hr tablet Take 0.5 tablets (12.5 mg total) by mouth daily. Patient taking differently: Take 12.5 mg by mouth daily as needed (AFIB).  11/18/19   Strader, Fransisco Hertz, PA-C  olmesartan (BENICAR) 40 MG tablet Take 1 tablet (40 mg total) by mouth daily. 10/24/19   Wert,  Christena Deem, MD  ondansetron (ZOFRAN ODT) 4 MG disintegrating tablet Take 1 tablet (4 mg total) by mouth every 8 (eight) hours as needed for nausea or vomiting. 12/16/19   [name], FNP  OneTouch Delica Lancets 66Y MISC Use to test blood sugars daily as directed. DX: E11.9 10/14/19   Evelina Dun A, FNP  pravastatin (PRAVACHOL) 40 MG tablet Take 1 tablet (40 mg total) by mouth every 3 (three) days. M-W-F Patient taking differently: Take 40 mg by mouth every Monday, Wednesday, and Friday. At night 04/25/19   Hawks, Christy A, FNP  Semaglutide (RYBELSUS) 7 MG TABS Take 7 mg by mouth every morning. Patient taking differently: Take 7 mg by mouth daily.  11/16/19   [name], FNP  tamsulosin (FLOMAX) 0.4 MG CAPS capsule TAKE 2 CAPSULES BY MOUTH EVERY DAY Patient taking differently: Take 0.4 mg by mouth in the morning and at bedtime.  11/14/19   Hawks, Christy A, FNP  TRELEGY ELLIPTA 100-62.5-25 MCG/INH AEPB Inhale 1 puff into the lungs daily. 01/28/20   [provider]  zolpidem (AMBIEN) 5 MG tablet TAKE 1 TABLET (5 MG TOTAL) BY MOUTH AT BEDTIME AS NEEDED FOR SLEEP.  11/29/19   Glennie Isle, NP-C    Allergies    Jardiance [empagliflozin] and Lopressor [metoprolol]  Review of Systems   Review of Systems  Constitutional: Positive for fever.  Respiratory: Positive for cough.   Gastrointestinal: Negative for abdominal pain and vomiting.  Musculoskeletal: Negative for arthralgias.  Neurological: Negative for headaches.  All other systems reviewed and are negative.   Physical Exam Updated Vital Signs BP 115/65 (BP Location: Left Arm)   Pulse (!) 120   Temp 98.8 F (37.1 C) (Oral)   Resp (!) 21   Ht 1.676 m (_0 )   Wt 79.2 kg   SpO2 92%   BMI 28.18 kg/m   Physical Exam CONSTITUTIONAL: Elderly, no acute distress HEAD: Normocephalic/atraumatic, no visible trauma EYES: EOMI/PERRL ENMT: Mucous membranes moist NECK: supple no meningeal signs SPINE/BACK:entire spine nontender CV: S1/S2 noted, tachycardic LUNGS: Mild tachypnea, crackles in left base ABDOMEN: soft, nontender, no rebound or guarding, bowel sounds noted throughout abdomen GU:no cva tenderness, Foley catheter in place on arrival.  Discharge is noted around the catheter.  Nurse Amber present for exam NEURO: Pt is awake/alert/appropriate, moves all extremitiesx4.  No facial droop.   EXTREMITIES: pulses normal/equal, full ROM, all extremities/joints palpated/ranged and nontender, chronic wound noted to plantar surface of left foot without signs of erythema or drainage, no crepitus SKIN: warm, color normal PSYCH: no abnormalities of mood noted, alert and oriented to situation  ED Results / Procedures / Treatments   Labs (all labs ordered are listed, but only abnormal results are displayed) Labs Reviewed  URINALYSIS, ROUTINE W REFLEX MICROSCOPIC - Abnormal; Notable for the following components:      Result Value   APPearance HAZY (*)    Hgb urine dipstick LARGE (*)    Protein, ur 100 (*)    Leukocytes,Ua LARGE (*)    WBC, UA >50 (*)    Bacteria, UA RARE (*)    All other  components within normal limits  COMPREHENSIVE METABOLIC PANEL - Abnormal; Notable for the following components:   Potassium 3.2 (*)    Glucose, Bld 146 (*)    All other components within normal limits  CBC WITH DIFFERENTIAL/PLATELET - Abnormal; Notable for the following components:   WBC 14.7 (*)    Neutro Abs 12.6 (*)  Lymphs Abs 0.4 (*)    Monocytes Absolute 1.3 (*)    Abs Immature Granulocytes 0.19 (*)    All other components within normal limits  RESP PANEL BY RT-PCR (FLU A&B, COVID) ARPGX2  URINE CULTURE  CULTURE, BLOOD (ROUTINE X 2)  CULTURE, BLOOD (ROUTINE X 2)  LACTIC ACID, PLASMA  PROTIME-INR  APTT  LACTIC ACID, PLASMA    EKG EKG Interpretation  Date/Time:  Saturday March 03 2020 04:51:47 EST Ventricular Rate:  116 PR Interval:    QRS Duration: 106 QT Interval:  435 QTC Calculation: 605 R Axis:   20 Text Interpretation: Sinus tachycardia Low voltage, precordial leads Consider inferior infarct Prolonged QT interval Baseline wander in lead(s) V2 V5 V6 Confirmed by Ripley Fraise [phone]) on 03/03/2020 5:12:02 AM   Radiology DG Chest Port 1 View  Result Date: 03/03/2020 CLINICAL DATA:  Patient fell out of chair.  Sepsis. EXAM: PORTABLE CHEST 1 VIEW COMPARISON:  11/07/2019 FINDINGS: Heart size and pulmonary vascularity are normal. Coarse perihilar infiltrates in the lungs probably due to chronic bronchitis. No focal consolidation. No pleural effusions. No pneumothorax. Mediastinal contours appear intact. Calcification of the aorta. Power port type central venous catheter with tip over the low SVC region. IMPRESSION: Chronic bronchitic changes in the lungs. No evidence of active pulmonary disease. Electronically Signed   By: Lucienne Capers M.D.   On: 03/03/2020 05:24    Procedures .Critical Care Performed by: Ripley Fraise, MD Authorized by: Ripley Fraise, MD   Critical care provider statement:    Critical care time (minutes):  37   Critical care  start time:  03/03/2020 5:23 AM   Critical care end time:  03/03/2020 6:00 AM   Critical care time was exclusive of:  Separately billable procedures and treating other patients   Critical care was necessary to treat or prevent imminent or life-threatening deterioration of the following conditions:  Sepsis and respiratory failure   Critical care was time spent personally by me on the following activities:  Development of treatment plan with patient or surrogate, examination of patient, evaluation of patient's response to treatment, pulse oximetry, re-evaluation of patient's condition, ordering and review of radiographic studies, ordering and review of laboratory studies, ordering and performing treatments and interventions and review of old charts      Medications Ordered in ED Medications  lactated ringers infusion ( Intravenous New Bag/Given 03/03/20 0628)  lactated ringers bolus 1,000 mL (0 mLs Intravenous Stopped 03/03/20 0552)    And  lactated ringers bolus 1,000 mL (0 mLs Intravenous Stopped 03/03/20 0627)    And  lactated ringers bolus 500 mL (0 mLs Intravenous Stopped 03/03/20 0636)  ceFEPIme (MAXIPIME) 2 g in sodium chloride 0.9 % 100 mL IVPB (0 g Intravenous Stopped 03/03/20 0627)  ibuprofen (ADVIL) tablet 400 mg (400 mg Oral Given 03/03/20 3893)    ED Course  I have reviewed the triage vital signs and the nursing notes.  Pertinent labs & imaging results that were available during my care of the patient were reviewed by me and considered in my medical decision making (see chart for details).    MDM Rules/Calculators/A&P                          5:24 AM Patient presents after sliding out of his chair and was noted to have a fever.  Code sepsis has been called, as patient is tachycardic, tachypneic and likely has a fever.  Plan is to replace  the Foley catheter (patient reports that was recently removed and replaced approximately 3 days ago) check urinalysis, chest x-ray and  labs. IV fluids and broad-spectrum antibiotics have been ordered We will follow closely 5:46 AM Discussed case with [name] his significant other.  She reports he has been having intermittent fevers for the past several days.  Tonight he appeared more confused and had a fever and she gave him Tylenol.  She reports when he slid off the couch she was unable to get him up and called 911.  She reports he did not have any injuries 7:08 AM Patient found to have UTI.  Patient be admitted for early urosepsis. Patient has been stabilized in the emergency department 7:27 AM Signed out to Dr. Dyann Kief Final Clinical Impression(s) / ED Diagnoses Final diagnoses:  Complicated UTI (urinary tract infection)  Acute cystitis with hematuria    Rx / DC Orders ED Discharge Orders    None       Ripley Fraise, MD 03/03/20 [id number]

## 2020-03-03 NOTE — Progress Notes (Signed)
11/27 Code sepsis initiated. Elink following.

## 2020-03-03 NOTE — Progress Notes (Signed)
Pharmacy Antibiotic Note  John [name] is a 74 y.o. male admitted on 03/03/2020 with UTI.  Pharmacy has been consulted for cefepime dosing.  Plan: cefepime 2gm iv q12h   Height: 5\' 6"  (167.6 cm) Weight: 79.2 kg (174 lb 9.7 oz) IBW/kg (Calculated) : 63.8  Temp (24hrs), Avg:99.7 F (37.6 C), Min:98.8 F (37.1 C), Max:100.6 F (38.1 C)  Recent Labs  Lab 03/03/20 0528  WBC 14.7*  CREATININE 1.13  LATICACIDVEN 1.0    Estimated Creatinine Clearance: 57.6 mL/min (by C-G formula based on SCr of 1.13 mg/dL).    Allergies  Allergen Reactions  . Jardiance [Empagliflozin]     FEELS SLUGGISH, TIRED  . Lopressor [Metoprolol]     Fatigue    Antimicrobials this admission: 11/27 cefepime >>   Microbiology results: 11/27 BCx: sent  11/27 UCx: sent  11/27 Covid/flu: negative    Thank you for allowing pharmacy to be a part of this patient's care.  Donna Christen Navid Lenzen 03/03/2020 8:53 AM

## 2020-03-03 NOTE — ED Notes (Signed)
Patient Sp02 88% on room air while asleep.  02 @ 2L nasal cannula applied.  Sp02 increased to 95%.

## 2020-03-03 NOTE — H&P (Signed)
History and Physical    John [name] ERX:[id number] DOB: 09-Sep-1945 DOA: 03/03/2020  PCP: [name], FNP   Patient coming from: home   I have personally briefly reviewed patient's old medical records in [place]  Chief Complaint: Fever, dysuria and altered mental status.  HPI: John [name] is a 74 y.o. male with medical history significant of obstructive sleep apnea, type 2 diabetes mellitus, BPH, COPD, depression, hypertension, hyperlipidemia, peripheral arterial disease and recent peripheral angiography where he was found with severe arterial disease on his right lower extremity; during that procedure patient experience acute urinary retention and ended requiring Foley catheter placement; despite follow-up with urology service has still not been able to void on his own and has remained with catheter placement; most recent exchange done on 02/29/20.  Per wife at bedside for the last 3 days or so he has been experiencing chills, not feeling himself and feeling weak.  They also reports some drainage and blood staining in his underwear.  On the day that he was brought to the hospital he ended spiking high-grade temperature at home was confused/altered mental status and ended up falling out of his recliner.  There has not been any chest pain, shortness of breath, melena, hematochezia, chest pain, abdominal pain, nausea/vomiting, coughing spells or sick contacts.   ED Course: Chest x-ray demonstrated no acute fracture or cardiopulmonary process.  Urinalysis suggesting active infection and patient met sepsis criteria on presentation, he has elevated WBCs, fever and tachycardia.  Cultures were taken IV fluids per sepsis protocol initiated and patient started on antibiotics.  TRH has been contacted to place in the hospital for further evaluation and management.  Review of Systems: As per HPI otherwise all other systems reviewed and are negative.   Past Medical History:  Diagnosis  Date  . Adenocarcinoma of lung, right ([place]) 04/18/2016  . Anxiety   . Arthritis   . Asthma   . CHF (congestive heart failure) ([place])   . COPD (chronic obstructive pulmonary disease) ([place])   . Depression   . Diabetes mellitus without complication (HCC)    no meds  . DM (diabetes mellitus) ([place]) 07/09/2016  . Dyspnea   . History of kidney stones   . Hyperlipidemia   . Hypertension   . Macular degeneration   . Neuropathy   . Non-small cell lung cancer, right ([place]) 04/18/2016  . Prostatitis   . Pulmonary nodule, left 07/16/2016  . Sleep apnea    cpap    Past Surgical History:  Procedure Laterality Date  . ABDOMINAL AORTOGRAM W/LOWER EXTREMITY Left 02/06/2020   Procedure: ABDOMINAL AORTOGRAM W/LOWER EXTREMITY;  Surgeon: Lorretta Harp, MD;  Location: [facility];  Service: Cardiovascular;  Laterality: Left;  . CATARACT EXTRACTION, BILATERAL Bilateral   . NO PAST SURGERIES    . PORTACATH PLACEMENT Left 06/13/2016   Procedure: INSERTION PORT-A-CATH;  Surgeon: Aviva Signs, MD;  Location: [facility];  Service: General;  Laterality: Left;  Marland Kitchen VIDEO BRONCHOSCOPY WITH ENDOBRONCHIAL NAVIGATION N/A 05/28/2016   Procedure: VIDEO BRONCHOSCOPY WITH ENDOBRONCHIAL NAVIGATION;  Surgeon: [name], MD;  Location: [facility];  Service: Thoracic;  Laterality: N/A;  . VIDEO BRONCHOSCOPY WITH ENDOBRONCHIAL ULTRASOUND N/A 05/28/2016   Procedure: VIDEO BRONCHOSCOPY WITH ENDOBRONCHIAL ULTRASOUND;  Surgeon: [name], MD;  Location: [facility];  Service: Thoracic;  Laterality: N/A;    Social History  reports that he has been smoking cigarettes. He started smoking about 59 years ago. He has a 27.50 pack-year smoking  history. He has never used smokeless tobacco. He reports that he does not drink alcohol and does not use drugs.  Allergies  Allergen Reactions  . Jardiance [Empagliflozin]     FEELS SLUGGISH, TIRED  . Lopressor [Metoprolol]     Fatigue    Family History  Problem Relation Age of  Onset  . Hypertension Mother   . Diabetes Father   . Heart disease Father   . Stroke Father   . Hypertension Sister     Prior to Admission medications   Medication Sig Start Date End Date Taking? Authorizing Provider  albuterol (VENTOLIN HFA) 108 (90 Base) MCG/ACT inhaler Inhale 2 puffs into the lungs every 4 (four) hours as needed for wheezing or shortness of breath. 12/14/19  Yes Tanda Rockers, MD  amLODipine (NORVASC) 5 MG tablet Take 1 tablet (5 mg total) by mouth daily. 04/25/19  Yes Hawks, Christy A, FNP  aspirin EC 81 MG tablet Take 81 mg by mouth daily.   Yes [provider]  cloNIDine (CATAPRES) 0.1 MG tablet TAKE 1 TABLET (0.1 MG TOTAL) BY MOUTH 3 (THREE) TIMES DAILY. 12/19/19  Yes Hawks, Christy A, FNP  dutasteride (AVODART) 0.5 MG capsule Take 0.5 mg by mouth daily.   Yes [provider]  fluticasone (FLONASE) 50 MCG/ACT nasal spray Place 2 sprays into both nostrils daily. Patient taking differently: Place 2 sprays into both nostrils daily as needed (allergies.).  12/07/15  Yes Wardell Honour, MD  furosemide (LASIX) 20 MG tablet Take 1 tablet (20 mg total) by mouth 2 (two) times daily. Patient taking differently: Take 20 mg by mouth daily as needed (feet/leg swelling (fluid retention)).  04/25/19  Yes Hawks, Christy A, FNP  gabapentin (NEURONTIN) 300 MG capsule Take 1 capsule (300 mg total) by mouth 3 (three) times daily. Patient taking differently: Take 300-600 mg by mouth See admin instructions. Take 1 capsule (300 mg) by mouth in the morning & take 2 capsules (600 mg) by mouth in the evening. 12/16/19  Yes Hawks, Christy A, FNP  levothyroxine (SYNTHROID) 75 MCG tablet Take 1 tablet (75 mcg total) by mouth daily. 04/25/19  Yes Hawks, [name], FNP  linaclotide (LINZESS) 72 MCG capsule Take 1 capsule (72 mcg total) by mouth daily before breakfast. 01/10/20  Yes Hawks, Christy A, FNP  meloxicam (MOBIC) 7.5 MG tablet TAKE 1 TABLET (7.5 MG TOTAL) BY MOUTH DAILY AS  NEEDED FOR PAIN (BACK PAIN). 12/22/19  Yes Hawks, Christy A, FNP  metFORMIN (GLUCOPHAGE-XR) 500 MG 24 hr tablet TAKE 1 TABLET BY MOUTH EVERY DAY WITH BREAKFAST Patient taking differently: Take 500 mg by mouth daily with breakfast.  01/16/20  Yes Loman Brooklyn, FNP  metoprolol succinate (TOPROL XL) 25 MG 24 hr tablet Take 0.5 tablets (12.5 mg total) by mouth daily. Patient taking differently: Take 12.5 mg by mouth daily as needed (AFIB).  11/18/19  Yes Strader, [place] M, PA-C  mupirocin ointment (BACTROBAN) 2 % Apply 1 application topically 3 (three) times daily. Apply with each dressing change to left foot 02/28/20  Yes [provider]  olmesartan (BENICAR) 40 MG tablet Take 1 tablet (40 mg total) by mouth daily. 10/24/19  Yes Tanda Rockers, MD  ondansetron (ZOFRAN ODT) 4 MG disintegrating tablet Take 1 tablet (4 mg total) by mouth every 8 (eight) hours as needed for nausea or vomiting. 12/16/19  Yes Hawks, Christy A, FNP  pravastatin (PRAVACHOL) 40 MG tablet Take 1 tablet (40 mg total) by mouth every 3 (  three) days. M-W-F Patient taking differently: Take 40 mg by mouth every Monday, Wednesday, and Friday. At night 04/25/19  Yes Hawks, Christy A, FNP  Semaglutide (RYBELSUS) 7 MG TABS Take 7 mg by mouth every morning. Patient taking differently: Take 7 mg by mouth daily.  11/16/19  Yes Hawks, Christy A, FNP  tamsulosin (FLOMAX) 0.4 MG CAPS capsule TAKE 2 CAPSULES BY MOUTH EVERY DAY Patient taking differently: Take 0.4 mg by mouth in the morning and at bedtime.  11/14/19  Yes Hawks, Christy A, FNP  TRELEGY ELLIPTA 100-62.5-25 MCG/INH AEPB Inhale 1 puff into the lungs daily. 01/28/20  Yes [provider]  zolpidem (AMBIEN) 5 MG tablet TAKE 1 TABLET (5 MG TOTAL) BY MOUTH AT BEDTIME AS NEEDED FOR SLEEP. 11/29/19  Yes Lockamy, Randi L, NP-C  Blood Glucose Monitoring Suppl (ONETOUCH VERIO REFLECT) w/Device KIT Use to test blood sugars daily as directed. DX: E11.9 10/14/19   Evelina Dun A,  FNP  glucose blood (ONETOUCH VERIO) test strip Use to test blood sugars daily as directed. DX: E11.9 10/14/19   [name], FNP  OneTouch Delica Lancets 22Q MISC Use to test blood sugars daily as directed. DX: E11.9 10/14/19   [name], FNP    Physical Exam: Vitals:   03/03/20 0715 03/03/20 0730 03/03/20 0745 03/03/20 0800  BP:  133/66  (!) 142/76  Pulse: (!) 108 (!) 111 (!) 113 (!) 110  Resp: 17 17 16 18   Temp:      TempSrc:      SpO2: 100% 99% 97% 100%  Weight:      Height:        Constitutional: Denies chest pain, no nausea or vomiting.  Following commands appropriately at this time. Vitals:   03/03/20 0715 03/03/20 0730 03/03/20 0745 03/03/20 0800  BP:  133/66  (!) 142/76  Pulse: (!) 108 (!) 111 (!) 113 (!) 110  Resp: 17 17 16 18   Temp:      TempSrc:      SpO2: 100% 99% 97% 100%  Weight:      Height:       Eyes: PERRL, lids and conjunctivae normal; no icterus, no nystagmus. ENMT: Mucous membranes are moist. Posterior pharynx clear of any exudate or lesions. Neck: normal, supple, no masses, no thyromegaly, no JVD. Respiratory: clear to auscultation bilaterally, no wheezing, no crackles. No accessory muscle use.  Cardiovascular: Sinus tachycardia, no rubs, no gallops, no JVD appreciated on exam. Abdomen: no tenderness, no masses palpated. No hepatosplenomegaly. Bowel sounds positive.  Exchanged Foley catheter in place. Musculoskeletal: no clubbing / cyanosis. No joint deformity upper and lower extremities. Good ROM, no contractures. Normal muscle tone.  Skin: No petechiae, chronic left plantar ulcer without signs of superimposed infection or drainage. Neurologic: CN 2-12 grossly intact. Sensation intact, DTR normal. Strength 5/5 in all 4.  Psychiatric: Normal judgment and insight. Alert and oriented x 3. Normal mood.   Labs on Admission: I have personally reviewed following labs and imaging studies  CBC: Recent Labs  Lab 03/03/20 0528  WBC 14.7*  NEUTROABS  12.6*  HGB 13.8  HCT 43.2  MCV 96.4  PLT 333    Basic Metabolic Panel: Recent Labs  Lab 03/03/20 0528  NA 136  K 3.2*  CL 99  CO2 26  GLUCOSE 146*  BUN 21  CREATININE 1.13  CALCIUM 9.1    GFR: Estimated Creatinine Clearance: 57.6 mL/min (by C-G formula based on SCr of 1.13 mg/dL).  Liver Function Tests:  Recent Labs  Lab 03/03/20 0528  AST 20  ALT 20  ALKPHOS 125  BILITOT 0.7  PROT 8.0  ALBUMIN 3.8    Urine analysis:    Component Value Date/Time   COLORURINE YELLOW 03/03/2020 0510   APPEARANCEUR HAZY (A) 03/03/2020 0510   LABSPEC 1.021 03/03/2020 0510   PHURINE 5.0 03/03/2020 0510   GLUCOSEU NEGATIVE 03/03/2020 0510   HGBUR LARGE (A) 03/03/2020 0510   BILIRUBINUR NEGATIVE 03/03/2020 0510   KETONESUR NEGATIVE 03/03/2020 0510   PROTEINUR 100 (A) 03/03/2020 0510   NITRITE NEGATIVE 03/03/2020 0510   LEUKOCYTESUR LARGE (A) 03/03/2020 0510    Radiological Exams on Admission: DG Chest Port 1 View  Result Date: 03/03/2020 CLINICAL DATA:  Patient fell out of chair.  Sepsis. EXAM: PORTABLE CHEST 1 VIEW COMPARISON:  11/07/2019 FINDINGS: Heart size and pulmonary vascularity are normal. Coarse perihilar infiltrates in the lungs probably due to chronic bronchitis. No focal consolidation. No pleural effusions. No pneumothorax. Mediastinal contours appear intact. Calcification of the aorta. Power port type central venous catheter with tip over the low SVC region. IMPRESSION: Chronic bronchitic changes in the lungs. No evidence of active pulmonary disease. Electronically Signed   By: Lucienne Capers M.D.   On: 03/03/2020 05:24    EKG: Independently reviewed.  Sinus tachycardia.  Assessment/Plan 1-Sepsis secondary to UTI ([place]) -Sepsis was present on admission -Follow-up cultures continue current antibiotics -Patient received fluid resuscitation per sepsis protocol 1 in the ED and will continue on normal saline at 100 cc/h. -As needed antiemetics and antipyretics will  be provided. -Continue supportive care.  2-essential hypertension -Stable and well-controlled -Resume Home antihypertensive regimen slowly -Heart healthy diet has been ordered.  3-type 2 diabetes mellitus -Recent A1c 7.7 -Holding oral hypoglycemic agents -Start on sliding scale insulin and low-dose Lantus.  4-peripheral arterial disease -Continue aspirin -Continue patient follow-up with cardiology and vascular surgery.  5-BPH with ongoing urinary retention -Continue adjusted dose of Flomax and Avodart -Foley catheter has been replaced -Continue patient follow-up with urologist  6-history of COPD -No wheezing or complaints of shortness of breath currently -Continue home bronchodilator regiment.  7-obstructive sleep apnea -Continue CPAP nightly.  8-history of hypothyroidism -Continue Synthroid  9-history of chronic constipation  -continue the use of Linzess.  DVT prophylaxis: Heparin Code Status:   Full code Family Communication:  Wife at bedside Disposition Plan:   Patient is from:  Home  Anticipated DC to:  Home  Anticipated DC date:  03/05/20  Anticipated DC barriers: Clearance of sepsis and is to be decisional condition for outpatient oral antibiotic therapy.  Consults called:  None Admission status:  Inpatient, length of stay more than 2 midnights; telemetry bed.  Severity of Illness: Moderate severity; high risk for further decompensation.  Patient presented with sepsis due to complicated UTI.    Barton Dubois MD [facility]  How to contact the [facility] Attending or Consulting provider [place] or covering provider during after hours [place], for this patient?   1. Check the care team in [facility] and look for a) attending/consulting [facility] provider listed and b) the [facility] team listed 2. Log into [email or web address] and use [facility]'s universal password to access. If you do not have the password, please contact the hospital operator. 3. Locate the [facility] provider you are  looking for under [facility] and page to a number that you can be directly reached. 4. If you still have difficulty reaching the provider, please page the [facility] (Director on Call) for  the Hospitalists listed on amion for assistance.  03/03/2020, 8:34 AM

## 2020-03-04 ENCOUNTER — Inpatient Hospital Stay (HOSPITAL_COMMUNITY): Payer: Medicare Other

## 2020-03-04 DIAGNOSIS — N390 Urinary tract infection, site not specified: Secondary | ICD-10-CM | POA: Diagnosis not present

## 2020-03-04 DIAGNOSIS — J449 Chronic obstructive pulmonary disease, unspecified: Secondary | ICD-10-CM

## 2020-03-04 DIAGNOSIS — A419 Sepsis, unspecified organism: Secondary | ICD-10-CM | POA: Diagnosis not present

## 2020-03-04 DIAGNOSIS — N401 Enlarged prostate with lower urinary tract symptoms: Secondary | ICD-10-CM | POA: Diagnosis not present

## 2020-03-04 DIAGNOSIS — N3001 Acute cystitis with hematuria: Secondary | ICD-10-CM | POA: Diagnosis not present

## 2020-03-04 DIAGNOSIS — R338 Other retention of urine: Secondary | ICD-10-CM

## 2020-03-04 LAB — GLUCOSE, CAPILLARY
Glucose-Capillary: 122 mg/dL — ABNORMAL HIGH (ref 70–99)
Glucose-Capillary: 180 mg/dL — ABNORMAL HIGH (ref 70–99)
Glucose-Capillary: 115 mg/dL — ABNORMAL HIGH (ref 70–99)

## 2020-03-04 LAB — BASIC METABOLIC PANEL
Anion gap: 10 (ref 5–15)
BUN: 16 mg/dL (ref 8–23)
CO2: 24 mmol/L (ref 22–32)
Calcium: 8.4 mg/dL — ABNORMAL LOW (ref 8.9–10.3)
Chloride: 101 mmol/L (ref 98–111)
Creatinine, Ser: 0.97 mg/dL (ref 0.61–1.24)
GFR, Estimated: >60 mL/min (ref >60)
Glucose, Bld: 134 mg/dL — ABNORMAL HIGH (ref 70–99)
Potassium: 3.4 mmol/L — ABNORMAL LOW (ref 3.5–5.1)
Sodium: 135 mmol/L (ref 135–145)

## 2020-03-04 LAB — BLOOD GAS, ARTERIAL
Acid-Base Excess: 1 mmol/L (ref 0.0–2.0)
Bicarbonate: 24.8 mmol/L (ref 20.0–28.0)
FIO2: 50
O2 Saturation: 88.9 %
Patient temperature: 39.2
pCO2 arterial: 50.9 mmHg — ABNORMAL HIGH (ref 32.0–48.0)
pH, Arterial: 7.337 — ABNORMAL LOW (ref 7.350–7.450)
pO2, Arterial: 70 mmHg — ABNORMAL LOW (ref 83.0–108.0)

## 2020-03-04 LAB — CBC
HCT: 40.3 % (ref 39.0–52.0)
Hemoglobin: 12.5 g/dL — ABNORMAL LOW (ref 13.0–17.0)
MCH: 30.6 pg (ref 26.0–34.0)
MCHC: 31 g/dL (ref 30.0–36.0)
MCV: 98.5 fL (ref 80.0–100.0)
Platelets: 195 10*3/uL (ref 150–400)
RBC: 4.09 MIL/uL — ABNORMAL LOW (ref 4.22–5.81)
RDW: 14.4 % (ref 11.5–15.5)
WBC: 8.7 10*3/uL (ref 4.0–10.5)
nRBC: 0 % (ref 0.0–0.2)

## 2020-03-04 MED ORDER — POLYETHYLENE GLYCOL 3350 17 G PO PACK
17.0000 g | PACK | Freq: Every day | ORAL | Status: DC
  Administered 2020-03-04 – 2020-03-05 (×2): 17 g via ORAL
  Filled 2020-03-04 (×2): qty 1

## 2020-03-04 MED ORDER — DOCUSATE SODIUM 100 MG PO CAPS
100.0000 mg | ORAL_CAPSULE | Freq: Two times a day (BID) | ORAL | Status: DC
  Administered 2020-03-04 – 2020-03-05 (×3): 100 mg via ORAL
  Filled 2020-03-04 (×3): qty 1

## 2020-03-04 NOTE — Progress Notes (Signed)
PROGRESS NOTE    John [name]  UQJ:[id number] DOB: 1946-04-02 DOA: 03/03/2020 PCP: [name], FNP   Chief Complaint  Patient presents with  . Fall    Brief Narrative:  Eric Nees is a 74 y.o. male with medical history significant of obstructive sleep apnea, type 2 diabetes mellitus, BPH, COPD, depression, hypertension, hyperlipidemia, peripheral arterial disease and recent peripheral angiography where he was found with severe arterial disease on his right lower extremity; during that procedure patient experience acute urinary retention and ended requiring Foley catheter placement; despite follow-up with urology service has still not been able to void on his own and has remained with catheter placement; most recent exchange done on 02/29/20.  Per wife at bedside for the last 3 days or so he has been experiencing chills, not feeling himself and feeling weak.  They also reports some drainage and blood staining in his underwear.  On the day that he was brought to the hospital he ended spiking high-grade temperature at home was confused/altered mental status and ended up falling out of his recliner.  There has not been any chest pain, shortness of breath, melena, hematochezia, chest pain, abdominal pain, nausea/vomiting, coughing spells or sick contacts.   ED Course: Chest x-ray demonstrated no acute fracture or cardiopulmonary process.  Urinalysis suggesting active infection and patient met sepsis criteria on presentation, he has elevated WBCs, fever and tachycardia.  Cultures were taken IV fluids per sepsis protocol initiated and patient started on antibiotics.  [facility] has been contacted to place in the hospital for further evaluation and management.  Assessment & Plan: 1-sepsis secondary to E. coli complicated UTI -Present on admission -Patient has been using Foley catheter for the last 3.5 weeks. -Follow culture sensitivity -Continue maintaining adequate oral hydration and IV  antibiotics. -Continue as needed antipyretics and antiemetics. -Continue supportive care and close orientation. -Patient was still slightly confused overnight.  2-essential hypertension -Appears to be stable and well-controlled -Continue home antihypertensive regimen.  3-type 2 diabetes mellitus -Recent A1c 7.7 -Continue holding oral hypoglycemic agents while inpatient -Continue sliding scale insulin -Modified carbohydrate diet has been ordered.  4-peripheral arterial disease -Continue aspirin. -Continue patient follow-up with cardiology and vascular surgery.  5-BPH with ongoing urinary retention-continue adjusted dose of Flomax and Avodart -Continue Foley catheter and outpatient follow-up with urology service.  6-history of COPD -Mild expiratory wheezing appreciated on today's exam -Moving good air and denies shortness of breath. -Continue as needed bronchodilator regimen.  7-obstructive sleep apnea -Continue CPAP nightly.  8-hypothyroidism -Continue Synthroid.  9-history of chronic constipation -Will continue the use of Linzess -Will also add Colace and MiraLAX.  DVT prophylaxis: Heparin Code Status: Full code. Family Communication: Wife at bedside. Disposition:   Status is: Inpatient  Dispo: The patient is from: Home              Anticipated d/c is to: Home              Anticipated d/c date is: 1-2 days.              Patient currently no medically ready for discharge; still experiencing intermittent confusion, positive E. coli appreciated in previous cultures and sensitivities still pending.  Low-grade temperature was also reported by patient's wife.  Continue IV antibiotics, continue supportive care and follow response.  Patient advised to maintain adequate hydration orally.     Consultants:   None   Procedures:  See below for x-ray report.   Antimicrobials:  Cefepime   Subjective:  Currently afebrile; with increased wheezing and confusion overnight  as per nursing report.  Denies chest pain, no nausea, no vomiting.  Patient also expressed no dysuria.  Objective: Vitals:   03/04/20 0745 03/04/20 0825 03/04/20 0956 03/04/20 1118  BP: 137/73  (!) 121/50   Pulse: 96 91 96   Resp: 20  20   Temp: 98 F (36.7 C)  98 F (36.7 C)   TempSrc:   Oral   SpO2: 98% 92% 97% 99%  Weight:      Height:        Intake/Output Summary (Last 24 hours) at 03/04/2020 1321 Last data filed at 03/04/2020 0453 Gross per 24 hour  Intake 128.78 ml  Output 1550 ml  Net -1421.22 ml   Filed Weights   03/03/20 0448  Weight: 79.2 kg    Examination:  General exam: Appears calm and comfortable during my evaluation; overnight having some confusion and increased expiratory wheezing.  No chest pain, no nausea, no vomiting.  Reports having constipation. Respiratory system: Good air movement bilaterally; mild expiratory wheezing appreciated along with scattered rhonchi.  No using accessory muscles. Cardiovascular system: Rate controlled, no rubs, no gallops, no JVD. Gastrointestinal system: Abdomen is nondistended, soft and nontender. No organomegaly or masses felt. Normal bowel sounds heard. Central nervous system: Alert and oriented. No focal neurological deficits. Extremities: No cyanosis or clubbing. Skin: No petechiae. Psychiatry: Mood & affect appropriate.     Data Reviewed: I have personally reviewed following labs and imaging studies  CBC: Recent Labs  Lab 03/03/20 0528 03/04/20 0751  WBC 14.7* 8.7  NEUTROABS 12.6*  --   HGB 13.8 12.5*  HCT 43.2 40.3  MCV 96.4 98.5  PLT 270 756    Basic Metabolic Panel: Recent Labs  Lab 03/03/20 0528 03/03/20 0535 03/04/20 0751  NA 136  --  135  K 3.2*  --  3.4*  CL 99  --  101  CO2 26  --  24  GLUCOSE 146*  --  134*  BUN 21  --  16  CREATININE 1.13  --  0.97  CALCIUM 9.1  --  8.4*  MG  --  2.1  --   PHOS  --  3.9  --     GFR: Estimated Creatinine Clearance: 67.2 mL/min (by C-G formula  based on SCr of 0.97 mg/dL).  Liver Function Tests: Recent Labs  Lab 03/03/20 0528  AST 20  ALT 20  ALKPHOS 125  BILITOT 0.7  PROT 8.0  ALBUMIN 3.8    CBG: Recent Labs  Lab 03/03/20 1206 03/03/20 1610 03/03/20 2249 03/04/20 0742 03/04/20 1129  GLUCAP 113* 124* 105* 122* 180*     Recent Results (from the past 240 hour(s))  Urine culture     Status: Abnormal (Preliminary result)   Collection Time: 03/03/20  5:10 AM   Specimen: Urine, Catheterized  Result Value Ref Range Status   Specimen Description   Final    URINE, CATHETERIZED Performed at [facility], [address] [place], [place] [address]    Special Requests   Final    NONE Performed at [facility], [address], [place], [place] [address]    Culture (A)  Final    >=100,000 COLONIES/mL ESCHERICHIA COLI SUSCEPTIBILITIES TO FOLLOW Performed at [facility], [place] [address], [place], [place] [address]    Report Status PENDING  Incomplete  Resp Panel by RT-PCR (Flu A&B, Covid) Nasopharyngeal Swab     Status: None   Collection  Time: 03/03/20  5:10 AM   Specimen: Nasopharyngeal Swab; Nasopharyngeal(NP) swabs in vial transport medium  Result Value Ref Range Status   SARS Coronavirus 2 by RT PCR NEGATIVE NEGATIVE Final    Comment: (NOTE) SARS-CoV-2 target nucleic acids are NOT DETECTED.  The SARS-CoV-2 RNA is generally detectable in upper respiratory specimens during the acute phase of infection. The lowest concentration of SARS-CoV-2 viral copies this assay can detect is 138 copies/mL. A negative result does not preclude SARS-Cov-2 infection and should not be used as the sole basis for treatment or other patient management decisions. A negative result may occur with  improper specimen collection/handling, submission of specimen other than nasopharyngeal swab, presence of viral mutation(s) within the areas targeted by this assay, and inadequate number of viral copies(<138 copies/mL). A negative  result must be combined with clinical observations, patient history, and epidemiological information. The expected result is Negative.  Fact Sheet for Patients:  EntrepreneurPulse.com.au  Fact Sheet for Healthcare Providers:  IncredibleEmployment.be  This test is no t yet approved or cleared by the [place] FDA and  has been authorized for detection and/or diagnosis of SARS-CoV-2 by FDA under an Emergency Use Authorization (EUA). This EUA will remain  in effect (meaning this test can be used) for the duration of the COVID-19 declaration under Section 564(b)(1) of the Act, 21 U.S.C.section 360bbb-3(b)(1), unless the authorization is terminated  or revoked sooner.       Influenza A by PCR NEGATIVE NEGATIVE Final   Influenza B by PCR NEGATIVE NEGATIVE Final    Comment: (NOTE) The Xpert Xpress SARS-CoV-2/FLU/RSV plus assay is intended as an aid in the diagnosis of influenza from Nasopharyngeal swab specimens and should not be used as a sole basis for treatment. Nasal washings and aspirates are unacceptable for Xpert Xpress SARS-CoV-2/FLU/RSV testing.  Fact Sheet for Patients: EntrepreneurPulse.com.au  Fact Sheet for Healthcare Providers: IncredibleEmployment.be  This test is not yet approved or cleared by the [place] FDA and has been authorized for detection and/or diagnosis of SARS-CoV-2 by FDA under an Emergency Use Authorization (EUA). This EUA will remain in effect (meaning this test can be used) for the duration of the COVID-19 declaration under Section 564(b)(1) of the Act, 21 U.S.C. section 360bbb-3(b)(1), unless the authorization is terminated or revoked.  Performed at [facility], [address], [place], [place] [address]   Blood Culture (routine x 2)     Status: None (Preliminary result)   Collection Time: 03/03/20  5:35 AM   Specimen: Left Antecubital; Blood  Result Value Ref Range  Status   Specimen Description   Final    LEFT ANTECUBITAL BOTTLES DRAWN AEROBIC AND ANAEROBIC   Special Requests   Final    Blood Culture results may not be optimal due to an inadequate volume of blood received in culture bottles   Culture   Final    NO GROWTH 1 DAY Performed at [facility], [address] [place], [place] [address]    Report Status PENDING  Incomplete  Blood Culture (routine x 2)     Status: None (Preliminary result)   Collection Time: 03/03/20  5:35 AM   Specimen: BLOOD RIGHT HAND  Result Value Ref Range Status   Specimen Description   Final    BLOOD RIGHT HAND BOTTLES DRAWN AEROBIC AND ANAEROBIC   Special Requests Blood Culture adequate volume  Final   Culture   Final    NO GROWTH 1 DAY Performed at [facility], [address]  [place], [place] [address]    Report Status PENDING  Incomplete     Radiology Studies: DG Chest Port 1 View  Result Date: 03/04/2020 CLINICAL DATA:  Confusion. Expiratory wheezes. Decreased oxygenation. EXAM: PORTABLE CHEST 1 VIEW COMPARISON:  03/03/2020 FINDINGS: Left central venous catheter is unchanged in position. Borderline heart size. Coarse peribronchial changes consistent with chronic bronchitis. Suggestion of developing peripheral interstitial process possibly representing developing edema. Multifocal pneumonia less likely. No pleural effusions. No pneumothorax. Calcification of the aorta. IMPRESSION: Chronic bronchitic changes with suggestion of developing peripheral interstitial edema. Electronically Signed   By: Lucienne Capers M.D.   On: 03/04/2020 05:16   DG Chest Port 1 View  Result Date: 03/03/2020 CLINICAL DATA:  Patient fell out of chair.  Sepsis. EXAM: PORTABLE CHEST 1 VIEW COMPARISON:  11/07/2019 FINDINGS: Heart size and pulmonary vascularity are normal. Coarse perihilar infiltrates in the lungs probably due to chronic bronchitis. No focal consolidation. No pleural effusions. No pneumothorax. Mediastinal contours  appear intact. Calcification of the aorta. Power port type central venous catheter with tip over the low SVC region. IMPRESSION: Chronic bronchitic changes in the lungs. No evidence of active pulmonary disease. Electronically Signed   By: Lucienne Capers M.D.   On: 03/03/2020 05:24    Scheduled Meds: . aspirin EC  81 mg Oral Daily  . Chlorhexidine Gluconate Cloth  6 each Topical Daily  . cloNIDine  0.1 mg Oral TID  . docusate sodium  100 mg Oral BID  . dutasteride  0.5 mg Oral Daily  . fluticasone furoate-vilanterol  1 puff Inhalation Daily  . gabapentin  300 mg Oral Daily  . gabapentin  600 mg Oral QHS  . heparin  5,000 Units Subcutaneous Q8H  . insulin aspart  0-15 Units Subcutaneous TID WC  . insulin aspart  0-5 Units Subcutaneous QHS  . insulin glargine  5 Units Subcutaneous QHS  . irbesartan  150 mg Oral Daily  . levothyroxine  75 mcg Oral Daily  . linaclotide  72 mcg Oral QAC breakfast  . metoprolol succinate  12.5 mg Oral Daily  . polyethylene glycol  17 g Oral Daily  . tamsulosin  0.8 mg Oral QPC supper  . umeclidinium bromide  1 puff Inhalation Daily   Continuous Infusions: . ceFEPime (MAXIPIME) IV 2 g (03/04/20 0944)     LOS: 1 day    Time spent: 30 minutes.    Barton Dubois, MD [facility]   To contact the attending provider between 7A-7P or the covering provider during after hours 7P-7A, please log into the web site [email or web address] and access using universal [place] password for that web site. If you do not have the password, please call the hospital operator.  03/04/2020, 1:21 PM

## 2020-03-04 NOTE — Progress Notes (Signed)
Notified dr Josephine Cables of pt O2 80%. Pt has removed CPAP but had Harris at 4L.  Pt appears confused, attempting to get out of bed. Upon assessment expiratory wheezing increased from previous assessment.   New orders to encourage pt to use CPAP, stop IV fluids for now, ABG and CXR. Oxygen increased to 92% with CPAP in place. Will continue to monitor.

## 2020-03-04 NOTE — Progress Notes (Signed)
4:06 AM Nurse called due to patient having increased expiratory wheezing from previous assessment and increased confusion.  Patient was supposed to be on CPAP, apparently, he has removed the CPAP due to intolerance. IV fluid was held, ABG and chest x-ray ordered ABG was ordered and showed pH 7.337, PCO2 50.9, PO2 70, bicarb 24.8 Chest x-ray ordered showed chronic bronchitis changes with suggestion of developing peripheral interstitial edema. CPAP was placed, O2 sat improved to 98%.  We shall continue to monitor.

## 2020-03-05 DIAGNOSIS — R319 Hematuria, unspecified: Secondary | ICD-10-CM

## 2020-03-05 DIAGNOSIS — N3001 Acute cystitis with hematuria: Secondary | ICD-10-CM

## 2020-03-05 DIAGNOSIS — R0902 Hypoxemia: Secondary | ICD-10-CM

## 2020-03-05 DIAGNOSIS — N390 Urinary tract infection, site not specified: Secondary | ICD-10-CM | POA: Diagnosis not present

## 2020-03-05 DIAGNOSIS — A419 Sepsis, unspecified organism: Secondary | ICD-10-CM | POA: Diagnosis not present

## 2020-03-05 DIAGNOSIS — R0600 Dyspnea, unspecified: Secondary | ICD-10-CM

## 2020-03-05 LAB — GLUCOSE, CAPILLARY
Glucose-Capillary: 117 mg/dL — ABNORMAL HIGH (ref 70–99)
Glucose-Capillary: 109 mg/dL — ABNORMAL HIGH (ref 70–99)
Glucose-Capillary: 139 mg/dL — ABNORMAL HIGH (ref 70–99)
Glucose-Capillary: 124 mg/dL — ABNORMAL HIGH (ref 70–99)

## 2020-03-05 LAB — URINE CULTURE: Culture: >=100000 — AB

## 2020-03-05 MED ORDER — CEFDINIR 300 MG PO CAPS: 300.0000 mg | ORAL_CAPSULE | Freq: Two times a day (BID) | ORAL | 0 refills | Status: AC

## 2020-03-05 MED ORDER — TAMSULOSIN HCL 0.4 MG PO CAPS
0.8000 mg | ORAL_CAPSULE | Freq: Every day | ORAL | 1 refills | Status: DC
Start: 2020-03-05 — End: 2020-03-20

## 2020-03-05 NOTE — Progress Notes (Signed)
Patient states understanding of discharge instructions.  

## 2020-03-05 NOTE — Discharge Summary (Signed)
Physician Discharge Summary  John [name] ZNB:[id number] DOB: 12-Aug-1945 DOA: 03/03/2020  PCP: [name], FNP  Admit date: 03/03/2020 Discharge date: 03/05/2020  Time spent: 35 minutes  Recommendations for Outpatient Follow-up:  1. Repeat basic metabolic panel to follow cholesterol function 2. Make sure patient has follow-up with urology service 3. Continue assisting with oxygen weaning off process as tolerated.   Discharge Diagnoses:  Active Problems:   Sepsis secondary to UTI ([place])   Acute cystitis with hematuria   Complicated UTI (urinary tract infection)   Dyspnea   Hypoxia Foley catheter associated UTI Obstructive sleep apnea COPD BPH Depression Hypertension Hyperlipidemia Type 2 diabetes mellitus PAD  Discharge Condition: Stable and improved.  Discharged home with instruction to follow-up with PCP and neurology after discharge.  CODE STATUS: Full code.  Diet recommendation: Heart healthy and modify carbohydrates diet  Filed Weights   03/03/20 0448  Weight: 79.2 kg    History of present illness:  John Buckneris a 73 y.o.malewith medical history significant ofobstructive sleep apnea, type 2 diabetes mellitus, BPH, COPD, depression, hypertension, hyperlipidemia, peripheral arterial disease and recent peripheral angiography where he was found with severe arterial disease on his right lower extremity; during thatprocedure patient experience acute urinary retention and ended requiring Foley catheter placement;despite follow-up with urology service has still not been able to void on his own and has remained with catheter placement; most recent exchange done on 02/29/20.Per wife at bedside for the last 3 days or so he has been experiencing chills, not feeling himself and feeling weak. They also reports some drainage and blood staining in his underwear.On the day that he was brought to the hospital he ended spiking high-grade temperature at home was  confused/altered mental status and ended up falling out of his recliner. There has not been any chest pain, shortness of breath, melena, hematochezia, chest pain, abdominal pain, nausea/vomiting, coughing spells or sick contacts.  ED Course:Chest x-ray demonstrated no acute fracture or cardiopulmonary process. Urinalysis suggesting active infection and patient met sepsis criteria on presentation, he has elevated WBCs, fever andtachycardia.Cultures were taken IV fluids per sepsis protocol initiated and patient started on antibiotics. TRH has been contacted to place in the hospital for further evaluation and management.  Hospital Course:  1-sepsis secondary to E. coli complicated UTI -Present on admission -Patient has been using Foley catheter for the last 3.5 weeks now. -Patient advised to maintain adequate hydration. -Cultures demonstrated pansensitive E. coli. -Continue supportive care and close orientation. -Mentation is now back to baseline; afebrile for 36 hours.  No nausea or vomiting reported. -We will discharge home on oral antibiotics to complete a total of 10 days (7 more days pending at discharge).  2-essential hypertension -Appears to be stable and well-controlled -Continue home antihypertensive regimen.  3-type 2 diabetes mellitus -Recent A1c 7.7 -Continue modified carbohydrate diet -Resume home oral hypoglycemic regimen.  4-peripheral arterial disease -Continue aspirin. -Continue patient follow-up with cardiology and vascular surgery.  5-BPH with ongoing urinary retention -continue adjusted dose of Flomax and Avodart -Continue Foley catheter and outpatient follow-up with urology service.  6-history of COPD -Mild expiratory wheezing appreciated on today's exam -Moving good air and denies shortness of breath. -Continue as needed bronchodilator regimen.  7-obstructive sleep apnea/hypoxia -Continue CPAP nightly. -Also found to require oxygen  supplementation with activity during the day; 3 L nasal cannula has been requested at time of discharge to assist with hypoxia on exertion.  8-hypothyroidism -Continue Synthroid.  9-history of chronic constipation -Will continue the use  of Linzess -Patient advised to maintain adequate hydration and increase fiber intake.  Procedures: See below for x-ray reports  Consultations:  None  Discharge Exam: Vitals:   03/05/20 0820 03/05/20 1358  BP:  111/68  Pulse:  92  Resp:  18  Temp:  98.2 F (36.8 C)  SpO2: 95% 98%    General: Afebrile, no chest pain, no nausea, no vomiting.  Found to require oxygen supplementation (3 L through nasal cannula) with activity to maintain appropriate saturation.  Patient denies dysuria and is feeling ready to go home. Cardiovascular: Rate controlled, no rubs, no gallops, no JVD. Respiratory: Positive scattered rhonchi; no wheezing, no crackles. Abdomen: Soft, nontender, distended, positive bowel sounds Extremities: No cyanosis or clubbing.  Discharge Instructions   Discharge Instructions    (HEART FAILURE PATIENTS) Call MD:  Anytime you have any of the following symptoms: 1) 3 pound weight gain in 24 hours or 5 pounds in 1 week 2) shortness of breath, with or without a dry hacking cough 3) swelling in the hands, feet or stomach 4) if you have to sleep on extra pillows at night in order to breathe.   Complete by: As directed    Diet - low sodium heart healthy   Complete by: As directed    Discharge instructions   Complete by: As directed    Take medications as prescribed Maintain adequate hydration Follow low-sodium diet Check your weight on daily basis Arrange follow-up with PCP in 10 days Follow-up as an outpatient with urology service.   Increase activity slowly   Complete by: As directed      Allergies as of 03/05/2020      Reactions   Jardiance [empagliflozin]    FEELS SLUGGISH, TIRED   Lopressor [metoprolol]    Fatigue       Medication List    STOP taking these medications   zolpidem 5 MG tablet Commonly known as: AMBIEN     TAKE these medications   albuterol 108 (90 Base) MCG/ACT inhaler Commonly known as: VENTOLIN HFA Inhale 2 puffs into the lungs every 4 (four) hours as needed for wheezing or shortness of breath.   amLODipine 5 MG tablet Commonly known as: NORVASC Take 1 tablet (5 mg total) by mouth daily.   aspirin EC 81 MG tablet Take 81 mg by mouth daily.   cefdinir 300 MG capsule Commonly known as: OMNICEF Take 1 capsule (300 mg total) by mouth 2 (two) times daily for 7 days.   cloNIDine 0.1 MG tablet Commonly known as: CATAPRES TAKE 1 TABLET (0.1 MG TOTAL) BY MOUTH 3 (THREE) TIMES DAILY.   dutasteride 0.5 MG capsule Commonly known as: AVODART Take 0.5 mg by mouth daily.   fluticasone 50 MCG/ACT nasal spray Commonly known as: FLONASE Place 2 sprays into both nostrils daily. What changed:   when to take this  reasons to take this   furosemide 20 MG tablet Commonly known as: LASIX Take 1 tablet (20 mg total) by mouth 2 (two) times daily. What changed:   when to take this  reasons to take this   gabapentin 300 MG capsule Commonly known as: NEURONTIN Take 1 capsule (300 mg total) by mouth 3 (three) times daily. What changed:   how much to take  when to take this  additional instructions   levothyroxine 75 MCG tablet Commonly known as: SYNTHROID Take 1 tablet (75 mcg total) by mouth daily.   linaclotide 72 MCG capsule Commonly known as: Linzess Take 1 capsule (  72 mcg total) by mouth daily before breakfast.   meloxicam 7.5 MG tablet Commonly known as: MOBIC TAKE 1 TABLET (7.5 MG TOTAL) BY MOUTH DAILY AS NEEDED FOR PAIN (BACK PAIN).   metFORMIN 500 MG 24 hr tablet Commonly known as: GLUCOPHAGE-XR TAKE 1 TABLET BY MOUTH EVERY DAY WITH BREAKFAST What changed: See the new instructions.   metoprolol succinate 25 MG 24 hr tablet Commonly known as: Toprol XL Take  0.5 tablets (12.5 mg total) by mouth daily. What changed:   when to take this  reasons to take this   mupirocin ointment 2 % Commonly known as: BACTROBAN Apply 1 application topically 3 (three) times daily. Apply with each dressing change to left foot   olmesartan 40 MG tablet Commonly known as: Benicar Take 1 tablet (40 mg total) by mouth daily.   ondansetron 4 MG disintegrating tablet Commonly known as: Zofran ODT Take 1 tablet (4 mg total) by mouth every 8 (eight) hours as needed for nausea or vomiting.   OneTouch Delica Lancets 06C Misc Use to test blood sugars daily as directed. DX: E11.9   OneTouch Verio Reflect w/Device Kit Use to test blood sugars daily as directed. DX: E11.9   OneTouch Verio test strip Generic drug: glucose blood Use to test blood sugars daily as directed. DX: E11.9   pravastatin 40 MG tablet Commonly known as: PRAVACHOL Take 1 tablet (40 mg total) by mouth every 3 (three) days. M-W-F What changed:   when to take this  additional instructions   Rybelsus 7 MG Tabs Generic drug: Semaglutide Take 7 mg by mouth every morning. What changed: when to take this   tamsulosin 0.4 MG Caps capsule Commonly known as: FLOMAX Take 2 capsules (0.8 mg total) by mouth daily after supper. What changed: when to take this   Trelegy Ellipta 100-62.5-25 MCG/INH Aepb Generic drug: Fluticasone-Umeclidin-Vilant Inhale 1 puff into the lungs daily.            Durable Medical Equipment  (From admission, onward)         Start     Ordered   03/05/20 1531  For home use only DME oxygen  Once       Question Answer Comment  Length of Need 12 Months   Mode or (Route) Nasal cannula   Liters per Minute 3   Frequency Continuous (stationary and portable oxygen unit needed)   Oxygen conserving device Yes   Oxygen delivery system Gas      03/05/20 1531         Allergies  Allergen Reactions  . Jardiance [Empagliflozin]     FEELS SLUGGISH, TIRED  .  Lopressor [Metoprolol]     Fatigue    Follow-up Information    [name], FNP. Schedule an appointment as soon as possible for a visit in 10 day(s).   Specialty: Family Medicine Contact information: [place] [address] [phone]        Herminio Commons, MD .   Specialty: Cardiology Contact information: [place] [address] [phone]               The results of significant diagnostics from this hospitalization (including imaging, microbiology, ancillary and laboratory) are listed below for reference.    Significant Diagnostic Studies: PERIPHERAL VASCULAR CATHETERIZATION  Result Date: 02/06/2020  [id number] LOCATION:  FACILITY: [facility] PHYSICIAN: Quay Burow, M.D. 01-04-1946 DATE OF PROCEDURE:  02/06/2020 DATE OF DISCHARGE: PV Angiogram/Intervention History obtained from chart review.John Sages  [name] is a 74 y.o.  mild to moderately overweight single Caucasian male father of 70, grandfather of 6 grandchildren who is accompanied by his significant other [name] today. He was referred by Bernerd Pho, PA-C for evaluation of lifestyle limiting claudication.  I last saw him in the office 9/1/2021His risk factors include 60-pack-year tobacco abuse continue to smoke 1 pack/day recalcitrant risk factor modification despite having been diagnosed with lung cancer. History of hypertension, diabetes and hyperlipidemia all treated medically. There is no family history for CAD the. Is never had a heart attack or stroke. He does complain of shortness of breath most likely related to COPD. He has coronary calcifications on his chest CT performed a year ago and it Myoview stress test that was low risk and nonischemic. He does complain of right calf claudication over the last 12 months with recent Doppler studies performed 10/26/2019 revealing right ABI 0.72 and a left of 0.99 with a high-frequency signal in his distal right SFA most likely  amenable to  endovascular therapy.  Since I saw him 6 weeks ago he continues to complain of right calf claudication wishes to proceed with endovascular therapy.  He does smoke we talked about the importance of smoking cessation. Pre Procedure Diagnosis: Claudication/PAD Post Procedure Diagnosis: Claudication/PAD Operators: Dr. Quay Burow Procedures Performed:  1.  Ultrasound-guided left common femoral access  2.  Abdominal aortogram/bilateral iliac angiogram/bifemoral runoff  3.  Mynx closure left common femoral artery  PROCEDURE DESCRIPTION: The patient was brought to the second floor [facility] lab in the the postabsorptive state. He was premedicated with IV Versed and fentanyl. His left groin was prepped and shaved in usual sterile fashion. Xylocaine 1% was used for local anesthesia. A 5 French sheath was inserted into the left common femoral artery using standard Seldinger technique.  Ultrasound was used to obtain left common femoral access.  A digital image of this was captured and placed the patient's chart.  A 5 French pigtail catheter was placed in the distal abdominal aorta.  Distal abdominal aortography, bilateral iliac angiography with bifemoral runoff was performed using bolus chase, digital subtraction and step table technique.  Omnipaque dye was used for the entirety of the case.  Retroaortic pressures monitored during the case.  A pullback gradient was performed across the left common iliac artery using a 5 French straight endhole catheter after ministration of 200 mcg of intra-arterial nitroglycerin demonstrating no significant gradient.  A left common femoral angiogram was performed and a MYNX closure device was successfully deployed achieving hemostasis. Angiographic Data: 1: Abdominal aorta-moderately atherosclerotic 2: Left lower extremity-50% calcified ostial/eccentric left common iliac artery stenosis without a pullback gradient noted.  There was three-vessel runoff 3: Right  lower extremity-95% calcified ostial right common iliac artery stenosis, 90% eccentric calcified right common femoral artery stenosis, 95% ostial right profunda stenosis.  95% mid right SFA, 80% right tibioperoneal trunk with three-vessel runoff.   John [name] has high-grade calcified ostial right common iliac stenosis followed by high-grade right common femoral and profunda stenosis and mid right SFA stenosis.  I believe he will need endarterectomy and patch angioplasty as well as profunda angioplasty of his right common femoral artery and profunda performed surgically after which I plan to perform orbital atherectomy and stenting of his right common iliac artery followed by staged right SFA intervention from the contralateral approach should he have continued claudication.  A left common femoral angiogram was performed and a Mynx closure device was successfully deployed.  The patient left lab in stable condition.  He will be discharged home today as an outpatient and will follow up with me later this week for discussion about revascularization strategy. Quay Burow. MD, [facility] 02/06/2020 8:38 AM   DG Chest Port 1 View  Result Date: 03/04/2020 CLINICAL DATA:  Confusion. Expiratory wheezes. Decreased oxygenation. EXAM: PORTABLE CHEST 1 VIEW COMPARISON:  03/03/2020 FINDINGS: Left central venous catheter is unchanged in position. Borderline heart size. Coarse peribronchial changes consistent with chronic bronchitis. Suggestion of developing peripheral interstitial process possibly representing developing edema. Multifocal pneumonia less likely. No pleural effusions. No pneumothorax. Calcification of the aorta. IMPRESSION: Chronic bronchitic changes with suggestion of developing peripheral interstitial edema. Electronically Signed   By: Lucienne Capers M.D.   On: 03/04/2020 05:16   DG Chest Port 1 View  Result Date: 03/03/2020 CLINICAL DATA:  Patient fell out of chair.  Sepsis. EXAM: PORTABLE CHEST 1 VIEW  COMPARISON:  11/07/2019 FINDINGS: Heart size and pulmonary vascularity are normal. Coarse perihilar infiltrates in the lungs probably due to chronic bronchitis. No focal consolidation. No pleural effusions. No pneumothorax. Mediastinal contours appear intact. Calcification of the aorta. Power port type central venous catheter with tip over the low SVC region. IMPRESSION: Chronic bronchitic changes in the lungs. No evidence of active pulmonary disease. Electronically Signed   By: Lucienne Capers M.D.   On: 03/03/2020 05:24    Microbiology: Recent Results (from the past 240 hour(s))  Urine culture     Status: Abnormal   Collection Time: 03/03/20  5:10 AM   Specimen: Urine, Catheterized  Result Value Ref Range Status   Specimen Description   Final    URINE, CATHETERIZED Performed at [facility], [address] [place], [place] [address]    Special Requests   Final    NONE Performed at [facility], [address] [place], [place] [address]    Culture >=100,000 COLONIES/mL ESCHERICHIA COLI (A)  Final   Report Status 03/05/2020 FINAL  Final   Organism ID, Bacteria ESCHERICHIA COLI (A)  Final      Susceptibility   Escherichia coli - MIC*    AMPICILLIN 4 SENSITIVE Sensitive     CEFAZOLIN <=4 SENSITIVE Sensitive     CEFEPIME <=0.12 SENSITIVE Sensitive     CEFTRIAXONE <=0.25 SENSITIVE Sensitive     CIPROFLOXACIN <=0.25 SENSITIVE Sensitive     GENTAMICIN <=1 SENSITIVE Sensitive     IMIPENEM <=0.25 SENSITIVE Sensitive     NITROFURANTOIN <=16 SENSITIVE Sensitive     TRIMETH/SULFA <=20 SENSITIVE Sensitive     AMPICILLIN/SULBACTAM <=2 SENSITIVE Sensitive     PIP/TAZO <=4 SENSITIVE Sensitive     * >=100,000 COLONIES/mL ESCHERICHIA COLI  Resp Panel by RT-PCR (Flu A&B, Covid) Nasopharyngeal Swab     Status: None   Collection Time: 03/03/20  5:10 AM   Specimen: Nasopharyngeal Swab; Nasopharyngeal(NP) swabs in vial transport medium  Result Value Ref Range Status   SARS Coronavirus 2 by RT PCR  NEGATIVE NEGATIVE Final    Comment: (NOTE) SARS-CoV-2 target nucleic acids are NOT DETECTED.  The SARS-CoV-2 RNA is generally detectable in upper respiratory specimens during the acute phase of infection. The lowest concentration of SARS-CoV-2 viral copies this assay can detect is 138 copies/mL. A negative result does not preclude SARS-Cov-2 infection and should not be used as the sole basis for treatment or other patient management decisions. A negative result may occur with  improper specimen collection/handling, submission of specimen other than nasopharyngeal swab, presence of viral mutation(s)  within the areas targeted by this assay, and inadequate number of viral copies(<138 copies/mL). A negative result must be combined with clinical observations, patient history, and epidemiological information. The expected result is Negative.  Fact Sheet for Patients:  EntrepreneurPulse.com.au  Fact Sheet for Healthcare Providers:  IncredibleEmployment.be  This test is no t yet approved or cleared by the [place] FDA and  has been authorized for detection and/or diagnosis of SARS-CoV-2 by FDA under an Emergency Use Authorization (EUA). This EUA will remain  in effect (meaning this test can be used) for the duration of the COVID-19 declaration under Section 564(b)(1) of the Act, 21 U.S.C.section 360bbb-3(b)(1), unless the authorization is terminated  or revoked sooner.       Influenza A by PCR NEGATIVE NEGATIVE Final   Influenza B by PCR NEGATIVE NEGATIVE Final    Comment: (NOTE) The Xpert Xpress SARS-CoV-2/FLU/RSV plus assay is intended as an aid in the diagnosis of influenza from Nasopharyngeal swab specimens and should not be used as a sole basis for treatment. Nasal washings and aspirates are unacceptable for Xpert Xpress SARS-CoV-2/FLU/RSV testing.  Fact Sheet for Patients: EntrepreneurPulse.com.au  Fact Sheet for  Healthcare Providers: IncredibleEmployment.be  This test is not yet approved or cleared by the [place] FDA and has been authorized for detection and/or diagnosis of SARS-CoV-2 by FDA under an Emergency Use Authorization (EUA). This EUA will remain in effect (meaning this test can be used) for the duration of the COVID-19 declaration under Section 564(b)(1) of the Act, 21 U.S.C. section 360bbb-3(b)(1), unless the authorization is terminated or revoked.  Performed at [facility], [address] [place], [place] [address]   Blood Culture (routine x 2)     Status: None (Preliminary result)   Collection Time: 03/03/20  5:35 AM   Specimen: Left Antecubital; Blood  Result Value Ref Range Status   Specimen Description   Final    LEFT ANTECUBITAL BOTTLES DRAWN AEROBIC AND ANAEROBIC   Special Requests   Final    Blood Culture results may not be optimal due to an inadequate volume of blood received in culture bottles   Culture   Final    NO GROWTH 2 DAYS Performed at [facility], [address] [place], [place] [address]    Report Status PENDING  Incomplete  Blood Culture (routine x 2)     Status: None (Preliminary result)   Collection Time: 03/03/20  5:35 AM   Specimen: BLOOD RIGHT HAND  Result Value Ref Range Status   Specimen Description   Final    BLOOD RIGHT HAND BOTTLES DRAWN AEROBIC AND ANAEROBIC   Special Requests Blood Culture adequate volume  Final   Culture   Final    NO GROWTH 2 DAYS Performed at [facility], [address] [place], [place] [address]    Report Status PENDING  Incomplete     Labs: Basic Metabolic Panel: Recent Labs  Lab 03/03/20 0528 03/03/20 0535 03/04/20 0751  NA 136  --  135  K 3.2*  --  3.4*  CL 99  --  101  CO2 26  --  24  GLUCOSE 146*  --  134*  BUN 21  --  16  CREATININE 1.13  --  0.97  CALCIUM 9.1  --  8.4*  MG  --  2.1  --   PHOS  --  3.9  --    Liver Function Tests: Recent Labs  Lab 03/03/20 0528  AST  20  ALT 20  ALKPHOS 125  BILITOT 0.7  PROT 8.0  ALBUMIN 3.8   CBC: Recent Labs  Lab 03/03/20 0528 03/04/20 0751  WBC 14.7* 8.7  NEUTROABS 12.6*  --   HGB 13.8 12.5*  HCT 43.2 40.3  MCV 96.4 98.5  PLT 270 195   CBG: Recent Labs  Lab 03/04/20 1129 03/04/20 1626 03/04/20 2202 03/05/20 0720 03/05/20 1138  GLUCAP 180* 115* 117* 109* 139*    Signed:  Barton Dubois MD.  [facility] 03/05/2020, 3:43 PM

## 2020-03-05 NOTE — TOC Transition Note (Signed)
Transition of Care [facility]) - CM/SW Discharge Note   Patient Details  Name: John [name] MRN: [id number] Date of Birth: 1945-08-17  [facility] CM/SW Contact:  Boneta Lucks, RN Phone Number: 03/05/2020, 4:08 PM   Clinical Narrative:   Patient admitted with Sepsis, had foley x 3 weeks. Patient discharging home today with the need of home oxygen. TOC visited the room to answer question about home oxygen and home health. Patient is agreeable, added to AVS.  Barbaraann Rondo with [facility] accepted the referral. Will Deliver to the room.  Patient need HH RN, Vaughan Basta with [facility] accepted the referral .     Final next level of care: [place] Barriers to Discharge: Barriers Resolved   Patient Goals and CMS Choice Patient states their goals for this hospitalization and ongoing recovery are:: to go home. CMS Medicare.gov Compare Post Acute Care list provided to:: Patient Choice offered to / list presented to : Patient  Discharge Placement       Patient and family notified of of transfer: 03/05/20  Discharge Plan and Services               DME Arranged: Oxygen   Date DME Agency Contacted: 03/05/20 Time DME Agency Contacted: [phone] Representative spoke with at DME Agency: [place]: RN [facility] Agency: [place] ([place]) Date [place]: 03/05/20 Time [place]: 1603 Representative spoke with at [place]: Romualdo Bolk

## 2020-03-05 NOTE — Plan of Care (Signed)

## 2020-03-05 NOTE — Progress Notes (Addendum)
Patient's O2 sat on ra at rest 91% and with ambulation O2 saturation  Dropped to 86% on room air.  Patient returned to room and O2 saturation increased to 100% when put on 4L O2 via [place]

## 2020-03-06 ENCOUNTER — Ambulatory Visit: Payer: Medicare Other | Admitting: Cardiovascular Disease

## 2020-03-07 ENCOUNTER — Telehealth: Payer: Self-pay

## 2020-03-07 DIAGNOSIS — J449 Chronic obstructive pulmonary disease, unspecified: Secondary | ICD-10-CM | POA: Diagnosis not present

## 2020-03-07 DIAGNOSIS — G4733 Obstructive sleep apnea (adult) (pediatric): Secondary | ICD-10-CM | POA: Diagnosis not present

## 2020-03-07 DIAGNOSIS — C3491 Malignant neoplasm of unspecified part of right bronchus or lung: Secondary | ICD-10-CM | POA: Diagnosis not present

## 2020-03-07 DIAGNOSIS — F32A Depression, unspecified: Secondary | ICD-10-CM | POA: Diagnosis not present

## 2020-03-07 DIAGNOSIS — N3000 Acute cystitis without hematuria: Secondary | ICD-10-CM | POA: Diagnosis not present

## 2020-03-07 DIAGNOSIS — I10 Essential (primary) hypertension: Secondary | ICD-10-CM | POA: Diagnosis not present

## 2020-03-07 DIAGNOSIS — K5909 Other constipation: Secondary | ICD-10-CM | POA: Diagnosis not present

## 2020-03-07 DIAGNOSIS — E1140 Type 2 diabetes mellitus with diabetic neuropathy, unspecified: Secondary | ICD-10-CM | POA: Diagnosis not present

## 2020-03-07 DIAGNOSIS — H3530 Unspecified macular degeneration: Secondary | ICD-10-CM | POA: Diagnosis not present

## 2020-03-07 DIAGNOSIS — R338 Other retention of urine: Secondary | ICD-10-CM | POA: Diagnosis not present

## 2020-03-07 DIAGNOSIS — N401 Enlarged prostate with lower urinary tract symptoms: Secondary | ICD-10-CM | POA: Diagnosis not present

## 2020-03-07 DIAGNOSIS — Z7982 Long term (current) use of aspirin: Secondary | ICD-10-CM | POA: Diagnosis not present

## 2020-03-07 DIAGNOSIS — F419 Anxiety disorder, unspecified: Secondary | ICD-10-CM | POA: Diagnosis not present

## 2020-03-07 DIAGNOSIS — E1151 Type 2 diabetes mellitus with diabetic peripheral angiopathy without gangrene: Secondary | ICD-10-CM | POA: Diagnosis not present

## 2020-03-07 DIAGNOSIS — Z9981 Dependence on supplemental oxygen: Secondary | ICD-10-CM | POA: Diagnosis not present

## 2020-03-07 DIAGNOSIS — Z791 Long term (current) use of non-steroidal anti-inflammatories (NSAID): Secondary | ICD-10-CM | POA: Diagnosis not present

## 2020-03-07 DIAGNOSIS — M1990 Unspecified osteoarthritis, unspecified site: Secondary | ICD-10-CM | POA: Diagnosis not present

## 2020-03-07 DIAGNOSIS — N390 Urinary tract infection, site not specified: Secondary | ICD-10-CM | POA: Diagnosis not present

## 2020-03-07 DIAGNOSIS — E039 Hypothyroidism, unspecified: Secondary | ICD-10-CM | POA: Diagnosis not present

## 2020-03-07 DIAGNOSIS — Z87891 Personal history of nicotine dependence: Secondary | ICD-10-CM | POA: Diagnosis not present

## 2020-03-07 DIAGNOSIS — T83511A Infection and inflammatory reaction due to indwelling urethral catheter, initial encounter: Secondary | ICD-10-CM | POA: Diagnosis not present

## 2020-03-07 DIAGNOSIS — A4151 Sepsis due to Escherichia coli [E. coli]: Secondary | ICD-10-CM | POA: Diagnosis not present

## 2020-03-07 DIAGNOSIS — Z7984 Long term (current) use of oral hypoglycemic drugs: Secondary | ICD-10-CM | POA: Diagnosis not present

## 2020-03-07 NOTE — Telephone Encounter (Signed)
Pt rc for nurse 

## 2020-03-08 LAB — CULTURE, BLOOD (ROUTINE X 2)
Culture: NO GROWTH
Culture: NO GROWTH
Special Requests: ADEQUATE

## 2020-03-08 NOTE — Telephone Encounter (Signed)
Patient states he already has an appointment on 03/20/20

## 2020-03-09 ENCOUNTER — Encounter: Payer: Self-pay | Admitting: Urology

## 2020-03-09 ENCOUNTER — Other Ambulatory Visit: Payer: Self-pay

## 2020-03-09 ENCOUNTER — Ambulatory Visit (INDEPENDENT_AMBULATORY_CARE_PROVIDER_SITE_OTHER): Payer: Medicare Other | Admitting: Urology

## 2020-03-09 VITALS — BP 149/76 | HR 118 | Temp 98.0°F | Ht 66.0 in | Wt 175.0 lb

## 2020-03-09 DIAGNOSIS — R339 Retention of urine, unspecified: Secondary | ICD-10-CM | POA: Insufficient documentation

## 2020-03-09 DIAGNOSIS — N138 Other obstructive and reflux uropathy: Secondary | ICD-10-CM | POA: Diagnosis not present

## 2020-03-09 DIAGNOSIS — N401 Enlarged prostate with lower urinary tract symptoms: Secondary | ICD-10-CM | POA: Diagnosis not present

## 2020-03-09 DIAGNOSIS — I2510 Atherosclerotic heart disease of native coronary artery without angina pectoris: Secondary | ICD-10-CM

## 2020-03-09 NOTE — Patient Instructions (Signed)
Acute Urinary Retention, Male ° °Acute urinary retention means that you cannot pee (urinate) at all, or that you pee too little and your bladder is not emptied completely. If it is not treated, it can lead to kidney damage or other serious problems. °Follow these instructions at home: °· Take over-the-counter and prescription medicines only as told by your doctor. Ask your doctor what medicines you should stay away from. Do not take any medicine unless your doctor says it is okay to do so. °· If you were sent home with a tube that drains the bladder (catheter), take care of it as told by your doctor. °· Drink enough fluid to keep your pee clear or pale yellow. °· If you were given an antibiotic, take it as told by your doctor. Do not stop taking the antibiotic even if you start to feel better. °· Do not use any products that contain nicotine or tobacco, such as cigarettes and e-cigarettes. If you need help quitting, ask your doctor. °· Watch for changes in your symptoms. Tell your doctor about them. °· If told, track changes in your blood pressure at home. Tell your doctor about them. °· Keep all follow-up visits as told by your doctor. This is important. °Contact a doctor if: °· You have spasms or you leak pee when you have spasms. °Get help right away if: °· You have chills or a fever. °· You have a tube that drains the bladder and: °? The tube stops draining pee. °? The tube falls out. °· You have blood in your pee. °Summary °· Acute urinary retention means that you have problems peeing. It may mean that you cannot pee at all, or that you pee too little. °· If this condition is not treated, it can lead to kidney damage or other serious problems. °· If you were sent home with a tube that drains the bladder, take care of it as told by your doctor. °· Monitor any changes in your symptoms. Tell your doctor about any changes. °This information is not intended to replace advice given to you by your health care  provider. Make sure you discuss any questions you have with your health care provider. °Document Revised: 06/10/2018 Document Reviewed: 04/25/2016 °Elsevier Patient Education © 2020 [employer] ° °

## 2020-03-09 NOTE — Progress Notes (Signed)
03/09/2020 2:24 PM   Starr Lake 1945-08-07 [id number]  Referring provider: [name], [address] [place],  [place] [id number]  Urinary retention  HPI: Mr Steers is  74yo here for followup for urinary retention. He was seen on 02/14/2020 by Dr. Jeffie Pollock and had a voiding trial which he failed. He then had the foley reinserted and then developed sepsis and was hospitalized for 5 days. He is on flomax 0.95m daily and dutasteride. He is unhappy with the indwelling foley   PMH: Past Medical History:  Diagnosis Date  . Adenocarcinoma of lung, right ([facility] 04/18/2016  . Anxiety   . Arthritis   . Asthma   . CHF (congestive heart failure) ([facility]   . COPD (chronic obstructive pulmonary disease) ([facility]   . Depression   . Diabetes mellitus without complication (HCC)    no meds  . DM (diabetes mellitus) ([facility] 07/09/2016  . Dyspnea   . History of kidney stones   . Hyperlipidemia   . Hypertension   . Macular degeneration   . Neuropathy   . Non-small cell lung cancer, right ([facility] 04/18/2016  . Prostatitis   . Pulmonary nodule, left 07/16/2016  . Sleep apnea    cpap    Surgical History: Past Surgical History:  Procedure Laterality Date  . ABDOMINAL AORTOGRAM W/LOWER EXTREMITY Left 02/06/2020   Procedure: ABDOMINAL AORTOGRAM W/LOWER EXTREMITY;  Surgeon: BLorretta Harp MD;  [facility];  Service: Cardiovascular;  Laterality: Left;  . CATARACT EXTRACTION, BILATERAL Bilateral   . NO PAST SURGERIES    . PORTACATH PLACEMENT Left 06/13/2016   Procedure: INSERTION PORT-A-CATH;  Surgeon: MAviva Signs MD;  Location: [facility];  Service: General;  Laterality: Left;  .Marland KitchenVIDEO BRONCHOSCOPY WITH ENDOBRONCHIAL NAVIGATION N/A 05/28/2016   Procedure: VIDEO BRONCHOSCOPY WITH ENDOBRONCHIAL NAVIGATION;  Surgeon: SMelrose Nakayama MD;  [facility]  Service: Thoracic;  Laterality: N/A;  . VIDEO BRONCHOSCOPY WITH ENDOBRONCHIAL ULTRASOUND N/A 05/28/2016   Procedure: VIDEO  BRONCHOSCOPY WITH ENDOBRONCHIAL ULTRASOUND;  Surgeon: SMelrose Nakayama MD;  [facility]  Service: Thoracic;  Laterality: N/A;    Home Medications:  Allergies as of 03/09/2020      Reactions   Jardiance [empagliflozin]    FEELS SLUGGISH, TIRED   Lopressor [metoprolol]    Fatigue      Medication List       Accurate as of March 09, 2020  2:24 PM. If you have any questions, ask your nurse or doctor.        albuterol 108 (90 Base) MCG/ACT inhaler Commonly known as: VENTOLIN HFA Inhale 2 puffs into the lungs every 4 (four) hours as needed for wheezing or shortness of breath.   amLODipine 5 MG tablet Commonly known as: NORVASC Take 1 tablet (5 mg total) by mouth daily.   aspirin EC 81 MG tablet Take 81 mg by mouth daily.   cefdinir 300 MG capsule Commonly known as: OMNICEF Take 1 capsule (300 mg total) by mouth 2 (two) times daily for 7 days.   cloNIDine 0.1 MG tablet Commonly known as: CATAPRES TAKE 1 TABLET (0.1 MG TOTAL) BY MOUTH 3 (THREE) TIMES DAILY.   dutasteride 0.5 MG capsule Commonly known as: AVODART Take 0.5 mg by mouth daily.   fluticasone 50 MCG/ACT nasal spray Commonly known as: FLONASE Place 2 sprays into both nostrils daily. What changed:   when to take this  reasons to take this   furosemide 20 MG tablet Commonly known  as: LASIX Take 1 tablet (20 mg total) by mouth 2 (two) times daily. What changed:   when to take this  reasons to take this   gabapentin 300 MG capsule Commonly known as: NEURONTIN Take 1 capsule (300 mg total) by mouth 3 (three) times daily. What changed:   how much to take  when to take this  additional instructions   levothyroxine 75 MCG tablet Commonly known as: SYNTHROID Take 1 tablet (75 mcg total) by mouth daily.   linaclotide 72 MCG capsule Commonly known as: Linzess Take 1 capsule (72 mcg total) by mouth daily before breakfast.   meloxicam 7.5 MG tablet Commonly known as: MOBIC TAKE 1 TABLET  (7.5 MG TOTAL) BY MOUTH DAILY AS NEEDED FOR PAIN (BACK PAIN).   metFORMIN 500 MG 24 hr tablet Commonly known as: GLUCOPHAGE-XR TAKE 1 TABLET BY MOUTH EVERY DAY WITH BREAKFAST What changed: See the new instructions.   metoprolol succinate 25 MG 24 hr tablet Commonly known as: Toprol XL Take 0.5 tablets (12.5 mg total) by mouth daily. What changed:   when to take this  reasons to take this   mupirocin ointment 2 % Commonly known as: BACTROBAN Apply 1 application topically 3 (three) times daily. Apply with each dressing change to left foot   olmesartan 40 MG tablet Commonly known as: Benicar Take 1 tablet (40 mg total) by mouth daily.   ondansetron 4 MG disintegrating tablet Commonly known as: Zofran ODT Take 1 tablet (4 mg total) by mouth every 8 (eight) hours as needed for nausea or vomiting.   OneTouch Delica Lancets 94H Misc Use to test blood sugars daily as directed. DX: E11.9   OneTouch Verio Reflect w/Device Kit Use to test blood sugars daily as directed. DX: E11.9   OneTouch Verio test strip Generic drug: glucose blood Use to test blood sugars daily as directed. DX: E11.9   pravastatin 40 MG tablet Commonly known as: PRAVACHOL Take 1 tablet (40 mg total) by mouth every 3 (three) days. M-W-F What changed:   when to take this  additional instructions   Rybelsus 7 MG Tabs Generic drug: Semaglutide Take 7 mg by mouth every morning. What changed: when to take this   tamsulosin 0.4 MG Caps capsule Commonly known as: FLOMAX Take 2 capsules (0.8 mg total) by mouth daily after supper.   Trelegy Ellipta 100-62.5-25 MCG/INH Aepb Generic drug: Fluticasone-Umeclidin-Vilant Inhale 1 puff into the lungs daily.       Allergies:  Allergies  Allergen Reactions  . Jardiance [Empagliflozin]     FEELS SLUGGISH, TIRED  . Lopressor [Metoprolol]     Fatigue    Family History: Family History  Problem Relation Age of Onset  . Hypertension Mother   . Diabetes  Father   . Heart disease Father   . Stroke Father   . Hypertension Sister     Social History:  reports that he has been smoking cigarettes. He started smoking about 59 years ago. He has a 27.50 pack-year smoking history. He has never used smokeless tobacco. He reports that he does not drink alcohol and does not use drugs.  ROS: All other review of systems were reviewed and are negative except what is noted above in HPI  Physical Exam: BP (!) 149/76   Pulse (!) 118   Temp 98 F (36.7 C)   Ht _0  (1.676 m)   Wt 175 lb (79.4 kg)   BMI 28.25 kg/m   Constitutional:  Alert and oriented, No  acute distress. HEENT: [place] AT, moist mucus membranes.  Trachea midline, no masses. Cardiovascular: No clubbing, cyanosis, or edema. Respiratory: Normal respiratory effort, no increased work of breathing. GI: Abdomen is soft, nontender, nondistended, no abdominal masses GU: No CVA tenderness.  Lymph: No cervical or inguinal lymphadenopathy. Skin: No rashes, bruises or suspicious lesions. Neurologic: Grossly intact, no focal deficits, moving all 4 extremities. Psychiatric: Normal mood and affect.  Laboratory Data: Lab Results  Component Value Date   WBC 8.7 03/04/2020   HGB 12.5 (L) 03/04/2020   HCT 40.3 03/04/2020   MCV 98.5 03/04/2020   PLT 195 03/04/2020    Lab Results  Component Value Date   CREATININE 0.97 03/04/2020    No results found for: PSA  No results found for: TESTOSTERONE  Lab Results  Component Value Date   HGBA1C 7.5 (H) 12/16/2019    Urinalysis    Component Value Date/Time   COLORURINE YELLOW 03/03/2020 0510   APPEARANCEUR HAZY (A) 03/03/2020 0510   LABSPEC 1.021 03/03/2020 0510   PHURINE 5.0 03/03/2020 0510   GLUCOSEU NEGATIVE 03/03/2020 0510   HGBUR LARGE (A) 03/03/2020 0510   BILIRUBINUR NEGATIVE 03/03/2020 0510   KETONESUR NEGATIVE 03/03/2020 0510   PROTEINUR 100 (A) 03/03/2020 0510   NITRITE NEGATIVE 03/03/2020 0510   LEUKOCYTESUR LARGE (A)  03/03/2020 0510    Lab Results  Component Value Date   LABMICR 241.0 04/25/2019   BACTERIA RARE (A) 03/03/2020    Pertinent Imaging:  Results for orders placed in visit on 12/30/19  DG Abd 1 View  Narrative CLINICAL DATA:  Abdominal pain  EXAM: ABDOMEN - 1 VIEW  COMPARISON:  06/05/2017  FINDINGS: Scattered large and small bowel gas is noted. Mild retained fecal material is seen without obstructive change. Degenerative change of the lumbar spine is noted. No abnormal calcifications are seen.  IMPRESSION: Mild retained fecal material.   Electronically Signed By: Inez Catalina M.D. On: 01/01/2020 15:15  Results for orders placed during the hospital encounter of 08/28/16  US Venous Img Lower Bilateral  Narrative CLINICAL DATA:  Bilateral leg swelling.  Treatment for lung cancer.  EXAM: BILATERAL LOWER EXTREMITY VENOUS DOPPLER ULTRASOUND  TECHNIQUE: Gray-scale sonography with graded compression, as well as color Doppler and duplex ultrasound were performed to evaluate the lower extremity deep venous systems from the level of the common femoral vein and including the common femoral, femoral, profunda femoral, popliteal and calf veins including the posterior tibial, peroneal and gastrocnemius veins when visible. The superficial great saphenous vein was also interrogated. Spectral Doppler was utilized to evaluate flow at rest and with distal augmentation maneuvers in the common femoral, femoral and popliteal veins.  COMPARISON:  None.  FINDINGS: RIGHT LOWER EXTREMITY  Common Femoral Vein: No evidence of thrombus. Normal compressibility, respiratory phasicity and response to augmentation.  Saphenofemoral Junction: No evidence of thrombus. Normal compressibility and flow on color Doppler imaging.  Profunda Femoral Vein: No evidence of thrombus. Normal compressibility and flow on color Doppler imaging.  Femoral Vein: No evidence of thrombus. Normal  compressibility, respiratory phasicity and response to augmentation.  Popliteal Vein: No evidence of thrombus. Normal compressibility, respiratory phasicity and response to augmentation.  Calf Veins: No evidence of thrombus. Normal compressibility and flow on color Doppler imaging.  Superficial Great Saphenous Vein: No evidence of thrombus. Normal compressibility and flow on color Doppler imaging.  Venous Reflux:  None.  Other Findings:  None.  LEFT LOWER EXTREMITY  Common Femoral Vein: No evidence of thrombus. Normal compressibility, respiratory  phasicity and response to augmentation.  Saphenofemoral Junction: No evidence of thrombus. Normal compressibility and flow on color Doppler imaging.  Profunda Femoral Vein: No evidence of thrombus. Normal compressibility and flow on color Doppler imaging.  Femoral Vein: No evidence of thrombus. Normal compressibility, respiratory phasicity and response to augmentation.  Popliteal Vein: No evidence of thrombus. Normal compressibility, respiratory phasicity and response to augmentation.  Calf Veins: No evidence of thrombus. Normal compressibility and flow on color Doppler imaging.  Superficial Great Saphenous Vein: No evidence of thrombus. Normal compressibility and flow on color Doppler imaging.  Venous Reflux:  None.  Other Findings:  None.  IMPRESSION: No evidence of DVT within either lower extremity.   Electronically Signed By: Franchot Gallo M.D. On: 08/28/2016 14:17  No results found for this or any previous visit.  No results found for this or any previous visit.  No results found for this or any previous visit.  No results found for this or any previous visit.  No results found for this or any previous visit.  No results found for this or any previous visit.   Assessment & Plan:    1. Urinary retention -Schedule for UDS  2. BPH with urinary obstruction -Schedule for UDS   No follow-ups on  file.  Nicolette Bang, MD  [facility] [place]

## 2020-03-09 NOTE — Progress Notes (Signed)
Urological Symptom Review  Patient is experiencing the following symptoms: None    Review of Systems  Gastrointestinal (upper)  : Negative for upper GI symptoms  Gastrointestinal (lower) : Negative for lower GI symptoms  Constitutional : Fatigue  Skin: Negative for skin symptoms  Eyes: Blurred vision  Ear/Nose/Throat : Negative for Ear/Nose/Throat symptoms  Hematologic/Lymphatic: Easy bruising  Cardiovascular : Leg swelling  Respiratory : Shortness of breath  Endocrine: Excessive thirst  Musculoskeletal: Negative for musculoskeletal symptoms  Neurological: Negative for neurological symptoms  Psychologic: Negative for psychiatric symptoms

## 2020-03-12 DIAGNOSIS — N401 Enlarged prostate with lower urinary tract symptoms: Secondary | ICD-10-CM | POA: Diagnosis not present

## 2020-03-12 DIAGNOSIS — A4151 Sepsis due to Escherichia coli [E. coli]: Secondary | ICD-10-CM | POA: Diagnosis not present

## 2020-03-12 DIAGNOSIS — E1151 Type 2 diabetes mellitus with diabetic peripheral angiopathy without gangrene: Secondary | ICD-10-CM | POA: Diagnosis not present

## 2020-03-12 DIAGNOSIS — R338 Other retention of urine: Secondary | ICD-10-CM | POA: Diagnosis not present

## 2020-03-12 DIAGNOSIS — N3000 Acute cystitis without hematuria: Secondary | ICD-10-CM | POA: Diagnosis not present

## 2020-03-12 DIAGNOSIS — T83511A Infection and inflammatory reaction due to indwelling urethral catheter, initial encounter: Secondary | ICD-10-CM | POA: Diagnosis not present

## 2020-03-13 ENCOUNTER — Ambulatory Visit (INDEPENDENT_AMBULATORY_CARE_PROVIDER_SITE_OTHER): Payer: Medicare Other

## 2020-03-13 ENCOUNTER — Other Ambulatory Visit: Payer: Self-pay

## 2020-03-13 ENCOUNTER — Ambulatory Visit (INDEPENDENT_AMBULATORY_CARE_PROVIDER_SITE_OTHER): Payer: Medicare Other | Admitting: Vascular Surgery

## 2020-03-13 ENCOUNTER — Encounter: Payer: Self-pay | Admitting: Vascular Surgery

## 2020-03-13 VITALS — BP 155/66 | HR 93 | Temp 97.8°F | Resp 16 | Ht 66.0 in | Wt 163.0 lb

## 2020-03-13 DIAGNOSIS — E1151 Type 2 diabetes mellitus with diabetic peripheral angiopathy without gangrene: Secondary | ICD-10-CM

## 2020-03-13 DIAGNOSIS — C3491 Malignant neoplasm of unspecified part of right bronchus or lung: Secondary | ICD-10-CM | POA: Diagnosis not present

## 2020-03-13 DIAGNOSIS — Z7984 Long term (current) use of oral hypoglycemic drugs: Secondary | ICD-10-CM

## 2020-03-13 DIAGNOSIS — H3530 Unspecified macular degeneration: Secondary | ICD-10-CM

## 2020-03-13 DIAGNOSIS — G4733 Obstructive sleep apnea (adult) (pediatric): Secondary | ICD-10-CM

## 2020-03-13 DIAGNOSIS — Z87891 Personal history of nicotine dependence: Secondary | ICD-10-CM

## 2020-03-13 DIAGNOSIS — A4151 Sepsis due to Escherichia coli [E. coli]: Secondary | ICD-10-CM | POA: Diagnosis not present

## 2020-03-13 DIAGNOSIS — E039 Hypothyroidism, unspecified: Secondary | ICD-10-CM

## 2020-03-13 DIAGNOSIS — R338 Other retention of urine: Secondary | ICD-10-CM | POA: Diagnosis not present

## 2020-03-13 DIAGNOSIS — K5909 Other constipation: Secondary | ICD-10-CM

## 2020-03-13 DIAGNOSIS — E1140 Type 2 diabetes mellitus with diabetic neuropathy, unspecified: Secondary | ICD-10-CM

## 2020-03-13 DIAGNOSIS — N401 Enlarged prostate with lower urinary tract symptoms: Secondary | ICD-10-CM | POA: Diagnosis not present

## 2020-03-13 DIAGNOSIS — I739 Peripheral vascular disease, unspecified: Secondary | ICD-10-CM

## 2020-03-13 DIAGNOSIS — F32A Depression, unspecified: Secondary | ICD-10-CM

## 2020-03-13 DIAGNOSIS — Z9981 Dependence on supplemental oxygen: Secondary | ICD-10-CM

## 2020-03-13 DIAGNOSIS — M1990 Unspecified osteoarthritis, unspecified site: Secondary | ICD-10-CM

## 2020-03-13 DIAGNOSIS — I10 Essential (primary) hypertension: Secondary | ICD-10-CM

## 2020-03-13 DIAGNOSIS — T83511A Infection and inflammatory reaction due to indwelling urethral catheter, initial encounter: Secondary | ICD-10-CM | POA: Diagnosis not present

## 2020-03-13 DIAGNOSIS — J449 Chronic obstructive pulmonary disease, unspecified: Secondary | ICD-10-CM

## 2020-03-13 DIAGNOSIS — Z7982 Long term (current) use of aspirin: Secondary | ICD-10-CM

## 2020-03-13 DIAGNOSIS — N3000 Acute cystitis without hematuria: Secondary | ICD-10-CM

## 2020-03-13 DIAGNOSIS — Z791 Long term (current) use of non-steroidal anti-inflammatories (NSAID): Secondary | ICD-10-CM

## 2020-03-13 DIAGNOSIS — F419 Anxiety disorder, unspecified: Secondary | ICD-10-CM | POA: Diagnosis not present

## 2020-03-13 NOTE — Progress Notes (Signed)
Patient name: John [name] MRN: [id number] DOB: [birth year] Sex: male  REASON FOR CONSULT: Evaluate for right femoral endarterectomy  HPI: John [name] is a 74 y.o. male, with COPD, HTN, HLD, DM, PAD who presents as a referral from Dr. Gwenlyn Found for evaluation of right femoral endarterectomy.  Ultimately patient endorses several years of claudication in the right lower extremity.  States he gets severe burning in the right thigh after only walking about 20 to 25 feet.  He recently underwent arteriogram with Dr. Gwenlyn Found on 02/06/2020 that showed a 95% right common iliac stenosis as well as a 90% right common femoral stenosis and a high-grade profunda stenosis and also high grade right mid SFA stenosis .  His ABI is 0.72.  States he had a rough couple weeks given he was recently hospitalized at [facility] for UTI sepsis.  He is an indwelling Foley catheter now that has been giving him issues with voiding and has a follow-up with urology.  States he would like to have surgery at the beginning of January to give him a chance to get the catheter out and recover from being in the hospital.  Past Medical History:  Diagnosis Date  . Adenocarcinoma of lung, right ([place]) 04/18/2016  . Anxiety   . Arthritis   . Asthma   . CHF (congestive heart failure) ([place])   . COPD (chronic obstructive pulmonary disease) ([place])   . Depression   . Diabetes mellitus without complication (HCC)    no meds  . DM (diabetes mellitus) ([place]) 07/09/2016  . Dyspnea   . History of kidney stones   . Hyperlipidemia   . Hypertension   . Macular degeneration   . Neuropathy   . Non-small cell lung cancer, right ([place]) 04/18/2016  . Prostatitis   . Pulmonary nodule, left 07/16/2016  . Sleep apnea    cpap    Past Surgical History:  Procedure Laterality Date  . ABDOMINAL AORTOGRAM W/LOWER EXTREMITY Left 02/06/2020   Procedure: ABDOMINAL AORTOGRAM W/LOWER EXTREMITY;  Surgeon: Lorretta Harp, MD;  Location: [facility];   Service: Cardiovascular;  Laterality: Left;  . CATARACT EXTRACTION, BILATERAL Bilateral   . NO PAST SURGERIES    . PORTACATH PLACEMENT Left 06/13/2016   Procedure: INSERTION PORT-A-CATH;  Surgeon: Aviva Signs, MD;  Location: [facility];  Service: General;  Laterality: Left;  Marland Kitchen VIDEO BRONCHOSCOPY WITH ENDOBRONCHIAL NAVIGATION N/A 05/28/2016   Procedure: VIDEO BRONCHOSCOPY WITH ENDOBRONCHIAL NAVIGATION;  Surgeon: [name], MD;  Location: [facility];  Service: Thoracic;  Laterality: N/A;  . VIDEO BRONCHOSCOPY WITH ENDOBRONCHIAL ULTRASOUND N/A 05/28/2016   Procedure: VIDEO BRONCHOSCOPY WITH ENDOBRONCHIAL ULTRASOUND;  Surgeon: [name], MD;  Location: [facility];  Service: Thoracic;  Laterality: N/A;    Family History  Problem Relation Age of Onset  . Hypertension Mother   . Diabetes Father   . Heart disease Father   . Stroke Father   . Hypertension Sister     SOCIAL HISTORY: Social History   Socioeconomic History  . Marital status: Divorced    Spouse name: Not on file  . Number of children: 5  . Years of education: 10  . Highest education level: 10th grade  Occupational History  . Occupation: Retired  Tobacco Use  . Smoking status: Current Every Day Smoker    Packs/day: 0.50    Years: 55.00    Pack years: 27.50    Types: Cigarettes    Start date: 03/11/1961  . Smokeless tobacco: Never  Used  . Tobacco comment: 1/2 pack to 1 pack per day 12/14/19  Vaping Use  . Vaping Use: Never used  Substance and Sexual Activity  . Alcohol use: No  . Drug use: No  . Sexual activity: Yes    Birth control/protection: None  Other Topics Concern  . Not on file  Social History Narrative  . Not on file   Social Determinants of Health   Financial Resource Strain: Low Risk   . Difficulty of Paying Living Expenses: Not hard at all  Food Insecurity: No Food Insecurity  . Worried About Charity fundraiser in the Last Year: Never true  . Ran Out of Food in the Last Year: Never true   Transportation Needs: No Transportation Needs  . Lack of Transportation (Medical): No  . Lack of Transportation (Non-Medical): No  Physical Activity: Inactive  . Days of Exercise per Week: 0 days  . Minutes of Exercise per Session: 0 min  Stress: No Stress Concern Present  . Feeling of Stress : Not at all  Social Connections: Moderately Isolated  . Frequency of Communication with Friends and Family: More than three times a week  . Frequency of Social Gatherings with Friends and Family: More than three times a week  . Attends Religious Services: Never  . Active Member of Clubs or Organizations: No  . Attends Archivist Meetings: Never  . Marital Status: Living with partner  Intimate Partner Violence: Not At Risk  . Fear of Current or Ex-Partner: No  . Emotionally Abused: No  . Physically Abused: No  . Sexually Abused: No    Allergies  Allergen Reactions  . Jardiance [Empagliflozin]     FEELS SLUGGISH, TIRED  . Lopressor [Metoprolol]     Fatigue    Current Outpatient Medications  Medication Sig Dispense Refill  . amLODipine (NORVASC) 5 MG tablet Take 1 tablet (5 mg total) by mouth daily. 90 tablet 3  . aspirin EC 81 MG tablet Take 81 mg by mouth daily.    . Blood Glucose Monitoring Suppl (ONETOUCH VERIO REFLECT) w/Device KIT Use to test blood sugars daily as directed. DX: E11.9 1 kit 1  . cloNIDine (CATAPRES) 0.1 MG tablet TAKE 1 TABLET (0.1 MG TOTAL) BY MOUTH 3 (THREE) TIMES DAILY. 270 tablet 1  . fluticasone (FLONASE) 50 MCG/ACT nasal spray Place 2 sprays into both nostrils daily. (Patient taking differently: Place 2 sprays into both nostrils daily as needed (allergies.). ) 16 g 6  . furosemide (LASIX) 20 MG tablet Take 1 tablet (20 mg total) by mouth 2 (two) times daily. (Patient taking differently: Take 20 mg by mouth daily as needed (feet/leg swelling (fluid retention)). ) 180 tablet 1  . gabapentin (NEURONTIN) 300 MG capsule Take 1 capsule (300 mg total) by mouth  3 (three) times daily. (Patient taking differently: Take 300-600 mg by mouth See admin instructions. Take 1 capsule (300 mg) by mouth in the morning & take 2 capsules (600 mg) by mouth in the evening.) 270 capsule 2  . glucose blood (ONETOUCH VERIO) test strip Use to test blood sugars daily as directed. DX: E11.9 100 each 12  . levothyroxine (SYNTHROID) 75 MCG tablet Take 1 tablet (75 mcg total) by mouth daily. 90 tablet 3  . linaclotide (LINZESS) 72 MCG capsule Take 1 capsule (72 mcg total) by mouth daily before breakfast. 30 capsule 2  . meloxicam (MOBIC) 7.5 MG tablet TAKE 1 TABLET (7.5 MG TOTAL) BY MOUTH DAILY AS NEEDED FOR  PAIN (BACK PAIN). 90 tablet 0  . metFORMIN (GLUCOPHAGE-XR) 500 MG 24 hr tablet TAKE 1 TABLET BY MOUTH EVERY DAY WITH BREAKFAST (Patient taking differently: Take 500 mg by mouth daily with breakfast. ) 90 tablet 0  . metoprolol succinate (TOPROL XL) 25 MG 24 hr tablet Take 0.5 tablets (12.5 mg total) by mouth daily. (Patient taking differently: Take 12.5 mg by mouth daily as needed (AFIB). ) 45 tablet 3  . mupirocin ointment (BACTROBAN) 2 % Apply 1 application topically 3 (three) times daily. Apply with each dressing change to left foot    . olmesartan (BENICAR) 40 MG tablet Take 1 tablet (40 mg total) by mouth daily. 30 tablet 11  . ondansetron (ZOFRAN ODT) 4 MG disintegrating tablet Take 1 tablet (4 mg total) by mouth every 8 (eight) hours as needed for nausea or vomiting. 20 tablet 0  . OneTouch Delica Lancets 69S MISC Use to test blood sugars daily as directed. DX: E11.9 100 each 1  . pravastatin (PRAVACHOL) 40 MG tablet Take 1 tablet (40 mg total) by mouth every 3 (three) days. M-W-F (Patient taking differently: Take 40 mg by mouth every Monday, Wednesday, and Friday. At night) 90 tablet 1  . Semaglutide (RYBELSUS) 7 MG TABS Take 7 mg by mouth every morning. (Patient taking differently: Take 7 mg by mouth daily. ) 90 tablet 2  . tamsulosin (FLOMAX) 0.4 MG CAPS capsule Take 2  capsules (0.8 mg total) by mouth daily after supper. 120 capsule 1  . TRELEGY ELLIPTA 100-62.5-25 MCG/INH AEPB Inhale 1 puff into the lungs daily.    Marland Kitchen albuterol (VENTOLIN HFA) 108 (90 Base) MCG/ACT inhaler Inhale 2 puffs into the lungs every 4 (four) hours as needed for wheezing or shortness of breath. (Patient not taking: Reported on 03/13/2020) 8 g 6  . dutasteride (AVODART) 0.5 MG capsule Take 0.5 mg by mouth daily. (Patient not taking: Reported on 03/13/2020)     No current facility-administered medications for this visit.    REVIEW OF SYSTEMS:  _0  denotes positive finding, _1  denotes negative finding Cardiac  Comments:  Chest pain or chest pressure:    Shortness of breath upon exertion:    Short of breath when lying flat:    Irregular heart rhythm:        Vascular    Pain in calf, thigh, or hip brought on by ambulation:    Pain in feet at night that wakes you up from your sleep:     Blood clot in your veins:    Leg swelling:         Pulmonary    Oxygen at home:    Productive cough:     Wheezing:         Neurologic    Sudden weakness in arms or legs:     Sudden numbness in arms or legs:     Sudden onset of difficulty speaking or slurred speech:    Temporary loss of vision in one eye:     Problems with dizziness:         Gastrointestinal    Blood in stool:     Vomited blood:         Genitourinary    Burning when urinating:     Blood in urine:        Psychiatric    Major depression:         Hematologic    Bleeding problems:    Problems with blood clotting too easily:  Skin    Rashes or ulcers:        Constitutional    Fever or chills:      PHYSICAL EXAM: Vitals:   03/13/20 1518  BP: (!) 155/66  Pulse: 93  Resp: 16  Temp: 97.8 F (36.6 C)  TempSrc: Temporal  SpO2: 94%  Weight: 163 lb (73.9 kg)  Height: _0  (1.676 m)    GENERAL: The patient is a well-nourished male, in no acute distress. The vital signs are documented above. CARDIAC:  There is a regular rate and rhythm.  VASCULAR:  Left femoral pulse palpable Right femoral pulse weak but palpable Left PT palpable, no other palpable pedal pulses PULMONARY: There is good air exchange bilaterally without wheezing or rales. ABDOMEN: Soft and non-tender. MUSCULOSKELETAL: There are no major deformities or cyanosis. NEUROLOGIC: No focal weakness or paresthesias are detected. SKIN: There are no ulcers or rashes noted. PSYCHIATRIC: The patient has a normal affect.  DATA:   Previously reviewed aortogram that shows right common iliac artery calcified high grade stenosis as well as right common femoral calcified high grade stenosis and proximal profunda stenosis and right SFA high grade stenosis.  Assessment/Plan:  74 year old male presents for evaluation of possible right common femoral endarterectomy for short distance lifestyle limiting claudication.  I reviewed his arteriogram images with him and agree with Dr. Gwenlyn Found the patient would benefit from right femoral endarterectomy with likely profundoplasty given a high-grade proximal profunda stenosis as well.  I did discuss there would be some chance that after endarterectomy if I were not satisfied with the inflow he may require retrograde right common iliac stent.  Otherwise, he will follow-up with Dr. Gwenlyn Found for future interventions after endarterectomy.  Ultimately patient is agreeable to proceed and we will get him scheduled for early January.  He was just hospitalized for UTI sepsis and has an indwelling catheter and wants to wait until beginning of January to recover from his recent hospitalization which is understandable.  In addition he has an appointment with urology coming up to try and get his Foley removed.  Risks and benefits of surgery were discussed in detail.   Marty Heck, MD [facility] Office: [phone]

## 2020-03-14 ENCOUNTER — Other Ambulatory Visit: Payer: Self-pay

## 2020-03-14 ENCOUNTER — Telehealth: Payer: Self-pay

## 2020-03-14 DIAGNOSIS — E1151 Type 2 diabetes mellitus with diabetic peripheral angiopathy without gangrene: Secondary | ICD-10-CM | POA: Diagnosis not present

## 2020-03-14 DIAGNOSIS — N401 Enlarged prostate with lower urinary tract symptoms: Secondary | ICD-10-CM | POA: Diagnosis not present

## 2020-03-14 DIAGNOSIS — A4151 Sepsis due to Escherichia coli [E. coli]: Secondary | ICD-10-CM | POA: Diagnosis not present

## 2020-03-14 DIAGNOSIS — N3000 Acute cystitis without hematuria: Secondary | ICD-10-CM | POA: Diagnosis not present

## 2020-03-14 DIAGNOSIS — R338 Other retention of urine: Secondary | ICD-10-CM | POA: Diagnosis not present

## 2020-03-14 DIAGNOSIS — T83511A Infection and inflammatory reaction due to indwelling urethral catheter, initial encounter: Secondary | ICD-10-CM | POA: Diagnosis not present

## 2020-03-14 NOTE — Telephone Encounter (Signed)
Called wanting to know when his next procedure was going to be scheduled. Looked it is Urodynamics and told her she needed to call [place].

## 2020-03-15 ENCOUNTER — Other Ambulatory Visit: Payer: Self-pay | Admitting: Family

## 2020-03-16 ENCOUNTER — Ambulatory Visit: Payer: Medicare Other | Admitting: Family

## 2020-03-16 DIAGNOSIS — R338 Other retention of urine: Secondary | ICD-10-CM | POA: Diagnosis not present

## 2020-03-19 DIAGNOSIS — T83511A Infection and inflammatory reaction due to indwelling urethral catheter, initial encounter: Secondary | ICD-10-CM | POA: Diagnosis not present

## 2020-03-19 DIAGNOSIS — R338 Other retention of urine: Secondary | ICD-10-CM | POA: Diagnosis not present

## 2020-03-19 DIAGNOSIS — N3000 Acute cystitis without hematuria: Secondary | ICD-10-CM | POA: Diagnosis not present

## 2020-03-19 DIAGNOSIS — N401 Enlarged prostate with lower urinary tract symptoms: Secondary | ICD-10-CM | POA: Diagnosis not present

## 2020-03-19 DIAGNOSIS — E1151 Type 2 diabetes mellitus with diabetic peripheral angiopathy without gangrene: Secondary | ICD-10-CM | POA: Diagnosis not present

## 2020-03-19 DIAGNOSIS — A4151 Sepsis due to Escherichia coli [E. coli]: Secondary | ICD-10-CM | POA: Diagnosis not present

## 2020-03-20 ENCOUNTER — Ambulatory Visit (INDEPENDENT_AMBULATORY_CARE_PROVIDER_SITE_OTHER): Payer: Medicare Other | Admitting: Family

## 2020-03-20 ENCOUNTER — Other Ambulatory Visit: Payer: Self-pay

## 2020-03-20 ENCOUNTER — Encounter: Payer: Self-pay | Admitting: Family

## 2020-03-20 VITALS — BP 125/76 | HR 98 | Temp 97.3°F | Ht 66.0 in | Wt 169.2 lb

## 2020-03-20 DIAGNOSIS — N401 Enlarged prostate with lower urinary tract symptoms: Secondary | ICD-10-CM | POA: Diagnosis not present

## 2020-03-20 DIAGNOSIS — E119 Type 2 diabetes mellitus without complications: Secondary | ICD-10-CM

## 2020-03-20 DIAGNOSIS — E785 Hyperlipidemia, unspecified: Secondary | ICD-10-CM

## 2020-03-20 DIAGNOSIS — I10 Essential (primary) hypertension: Secondary | ICD-10-CM

## 2020-03-20 DIAGNOSIS — I739 Peripheral vascular disease, unspecified: Secondary | ICD-10-CM | POA: Diagnosis not present

## 2020-03-20 DIAGNOSIS — E1142 Type 2 diabetes mellitus with diabetic polyneuropathy: Secondary | ICD-10-CM | POA: Diagnosis not present

## 2020-03-20 DIAGNOSIS — I509 Heart failure, unspecified: Secondary | ICD-10-CM | POA: Diagnosis not present

## 2020-03-20 DIAGNOSIS — E1169 Type 2 diabetes mellitus with other specified complication: Secondary | ICD-10-CM

## 2020-03-20 DIAGNOSIS — F17210 Nicotine dependence, cigarettes, uncomplicated: Secondary | ICD-10-CM

## 2020-03-20 DIAGNOSIS — J431 Panlobular emphysema: Secondary | ICD-10-CM

## 2020-03-20 DIAGNOSIS — M8949 Other hypertrophic osteoarthropathy, multiple sites: Secondary | ICD-10-CM | POA: Diagnosis not present

## 2020-03-20 DIAGNOSIS — G4733 Obstructive sleep apnea (adult) (pediatric): Secondary | ICD-10-CM

## 2020-03-20 DIAGNOSIS — I2510 Atherosclerotic heart disease of native coronary artery without angina pectoris: Secondary | ICD-10-CM

## 2020-03-20 DIAGNOSIS — M150 Primary generalized (osteo)arthritis: Secondary | ICD-10-CM

## 2020-03-20 DIAGNOSIS — M159 Polyosteoarthritis, unspecified: Secondary | ICD-10-CM

## 2020-03-20 DIAGNOSIS — E039 Hypothyroidism, unspecified: Secondary | ICD-10-CM

## 2020-03-20 DIAGNOSIS — C3491 Malignant neoplasm of unspecified part of right bronchus or lung: Secondary | ICD-10-CM | POA: Diagnosis not present

## 2020-03-20 DIAGNOSIS — Z9989 Dependence on other enabling machines and devices: Secondary | ICD-10-CM | POA: Diagnosis not present

## 2020-03-20 DIAGNOSIS — N138 Other obstructive and reflux uropathy: Secondary | ICD-10-CM

## 2020-03-20 LAB — BAYER DCA HB A1C WAIVED: HB A1C (BAYER DCA - WAIVED): 6.8 % (ref <7.0)

## 2020-03-20 MED ORDER — PRAVASTATIN SODIUM 40 MG PO TABS: 40.0000 mg | ORAL_TABLET | ORAL | 1 refills | Status: DC

## 2020-03-20 MED ORDER — ONDANSETRON 4 MG PO TBDP: 4.0000 mg | ORAL_TABLET | Freq: Three times a day (TID) | ORAL | 0 refills | Status: DC | PRN

## 2020-03-20 MED ORDER — LINACLOTIDE 72 MCG PO CAPS: 72.0000 ug | ORAL_CAPSULE | Freq: Every day | ORAL | 2 refills | Status: DC

## 2020-03-20 MED ORDER — RYBELSUS 7 MG PO TABS: 7.0000 mg | ORAL_TABLET | Freq: Every day | ORAL | 1 refills | Status: DC

## 2020-03-20 MED ORDER — TRELEGY ELLIPTA 100-62.5-25 MCG/INH IN AEPB: 1.0000 | INHALATION_SPRAY | Freq: Every day | RESPIRATORY_TRACT | 3 refills | Status: DC

## 2020-03-20 MED ORDER — AMLODIPINE BESYLATE 5 MG PO TABS: 5.0000 mg | ORAL_TABLET | Freq: Every day | ORAL | 3 refills | Status: DC

## 2020-03-20 MED ORDER — MELOXICAM 7.5 MG PO TABS: 7.5000 mg | ORAL_TABLET | Freq: Every day | ORAL | 0 refills | Status: DC | PRN

## 2020-03-20 MED ORDER — LEVOTHYROXINE SODIUM 75 MCG PO TABS: 75.0000 ug | ORAL_TABLET | Freq: Every day | ORAL | 3 refills | Status: DC

## 2020-03-20 MED ORDER — TAMSULOSIN HCL 0.4 MG PO CAPS: 0.8000 mg | ORAL_CAPSULE | Freq: Every day | ORAL | 1 refills | Status: DC

## 2020-03-20 MED ORDER — METFORMIN HCL ER 500 MG PO TB24
500.0000 mg | ORAL_TABLET | Freq: Every day | ORAL | 1 refills | Status: DC
Start: 2020-03-20 — End: 2020-10-01

## 2020-03-20 NOTE — Patient Instructions (Signed)

## 2020-03-20 NOTE — Progress Notes (Signed)
Subjective:    Patient ID: John [name], male    DOB: 1945-08-30, 74 y.o.   MRN: [id number]  Chief Complaint  Patient presents with   Medical Management of Chronic Issues   Diabetes   Hospitalization Follow-up    UTI has a catheter    Pt presents to the officetoday for chronic care follow up. He is followed by Cardiologists for CHF. Followed by Oncologists every 6 months for non-small lung cancer.  He is followed by Pulmonologist every 6 months for COPD. He is followed by Vascular for PAD and is scheduled 01/03 for stent placement.   Since our last visit, he had a UTI and was hospitalized. He currently has a catheter. He is followed by Urologists and just had a bladder scan. Diabetes He presents for his follow-up diabetic visit. He has type 2 diabetes mellitus. His disease course has been stable. There are no hypoglycemic associated symptoms. Associated symptoms include blurred vision and foot paresthesias. Pertinent negatives for diabetes include no fatigue. There are no hypoglycemic complications. Symptoms are stable. Diabetic complications include heart disease and peripheral neuropathy. Pertinent negatives for diabetic complications include no nephropathy. Risk factors for coronary artery disease include dyslipidemia, diabetes mellitus, hypertension, male sex and sedentary lifestyle. He is following a generally healthy diet. His overall blood glucose range is 110-130 mg/dl. An ACE inhibitor/angiotensin II receptor blocker is being taken. Eye exam is current.  Hypertension This is a chronic problem. The current episode started more than 1 year ago. The problem has been resolved since onset. The problem is controlled. Associated symptoms include blurred vision, malaise/fatigue, peripheral edema and shortness of breath. Risk factors for coronary artery disease include dyslipidemia, obesity, male gender, sedentary lifestyle and smoking/tobacco exposure. The current treatment provides  moderate improvement. Identifiable causes of hypertension include a thyroid problem. Pheochromocytoma: bph.  Thyroid Problem Presents for follow-up visit. Symptoms include constipation. Patient reports no diaphoresis, diarrhea or fatigue. The symptoms have been stable. His past medical history is significant for hyperlipidemia.  Hyperlipidemia This is a chronic problem. The current episode started more than 1 year ago. The problem is controlled. Exacerbating diseases include obesity. Associated symptoms include shortness of breath. Current antihyperlipidemic treatment includes statins. The current treatment provides moderate improvement of lipids. Risk factors for coronary artery disease include dyslipidemia, hypertension, male sex, diabetes mellitus and a sedentary lifestyle.  Arthritis Presents for follow-up visit. He complains of pain and stiffness. The symptoms have been stable. Affected locations include the left knee, left hip, right knee and right hip. His pain is at a severity of 4/10. Pertinent negatives include no diarrhea or fatigue.  Nicotine Dependence Presents for follow-up visit. Symptoms are negative for fatigue. His urge triggers include company of smokers. The symptoms have been stable. He smokes < 1/2 a pack of cigarettes per day.  OSA USes CPAP nightly. Stable     Review of Systems  Constitutional: Positive for malaise/fatigue. Negative for diaphoresis and fatigue.  Eyes: Positive for blurred vision.  Respiratory: Positive for shortness of breath.   Gastrointestinal: Positive for constipation. Negative for diarrhea.  Musculoskeletal: Positive for arthritis and stiffness.  All other systems reviewed and are negative.      Objective:   Physical Exam Vitals reviewed.  Constitutional:      General: He is not in acute distress.    Appearance: He is well-developed and well-nourished.  HENT:     Head: Normocephalic.     Right Ear: Tympanic membrane normal.     Left  Ear:  Tympanic membrane normal.     Mouth/Throat:     Mouth: Oropharynx is clear and moist.  Eyes:     General:        Right eye: No discharge.        Left eye: No discharge.     Pupils: Pupils are equal, round, and reactive to light.  Neck:     Thyroid: No thyromegaly.  Cardiovascular:     Rate and Rhythm: Normal rate and regular rhythm.     Pulses: Intact distal pulses.     Heart sounds: Normal heart sounds. No murmur heard.   Pulmonary:     Effort: Pulmonary effort is normal. No respiratory distress.     Breath sounds: Normal breath sounds. No wheezing.  Abdominal:     General: Bowel sounds are normal. There is no distension.     Palpations: Abdomen is soft.     Tenderness: There is no abdominal tenderness.  Musculoskeletal:        General: No tenderness. Normal range of motion.     Cervical back: Normal range of motion and neck supple.     Right lower leg: Edema (trace) present.     Left lower leg: Edema (trace) present.  Skin:    General: Skin is warm and dry.     Findings: No erythema or rash.  Neurological:     Mental Status: He is alert and oriented to person, place, and time.     Cranial Nerves: No cranial nerve deficit.     Deep Tendon Reflexes: Reflexes are normal and symmetric.  Psychiatric:        Mood and Affect: Mood and affect normal.        Behavior: Behavior normal.        Thought Content: Thought content normal.        Judgment: Judgment normal.       BP 125/76    Pulse 98    Temp (!) 97.3 F (36.3 C) (Temporal)    Ht 5' 6"  (1.676 m)    Wt 169 lb 3.2 oz (76.7 kg)    SpO2 98%    BMI 27.31 kg/m      Assessment & Plan:  [place] comes in today with chief complaint of Medical Management of Chronic Issues, Diabetes, and Hospitalization Follow-up (UTI has a catheter )   Diagnosis and orders addressed:  1. Type 2 diabetes mellitus without complication, without long-term current use of insulin (HCC) - Bayer DCA Hb A1c Waived - CMP14+EGFR - CBC  with Differential/Platelet  2. Congestive heart failure, unspecified HF chronicity, unspecified heart failure type ([place]) - CMP14+EGFR - CBC with Differential/Platelet  3. Essential hypertension - CMP14+EGFR - CBC with Differential/Platelet  4. PAD (peripheral artery disease) (HCC) - CMP14+EGFR - CBC with Differential/Platelet  5. Panlobular emphysema (HCC) - CMP14+EGFR - CBC with Differential/Platelet  6. Non-small cell lung cancer, right (HCC) - CMP14+EGFR - CBC with Differential/Platelet  7. Obstructive sleep apnea treated with continuous positive airway pressure (CPAP) - CMP14+EGFR - CBC with Differential/Platelet  8. OSA (obstructive sleep apnea) - CMP14+EGFR - CBC with Differential/Platelet  9. Diabetic polyneuropathy associated with type 2 diabetes mellitus (HCC) - CMP14+EGFR - CBC with Differential/Platelet  10. Hyperlipidemia associated with type 2 diabetes mellitus (HCC) - CMP14+EGFR - CBC with Differential/Platelet  11. Hypothyroidism, unspecified type - CMP14+EGFR - CBC with Differential/Platelet - TSH  12. Primary osteoarthritis involving multiple joints - CMP14+EGFR - CBC with Differential/Platelet  13. BPH with urinary  obstruction - CMP14+EGFR - CBC with Differential/Platelet  14. Cigarette smoker - CMP14+EGFR - CBC with Differential/Platelet   Labs pending Health Maintenance reviewed Diet and exercise encouraged  Follow up plan: 3 months    Evelina Dun, FNP

## 2020-03-21 LAB — CMP14+EGFR
ALT: 20 IU/L (ref 0–44)
AST: 23 IU/L (ref 0–40)
Albumin/Globulin Ratio: 1.3 (ref 1.2–2.2)
Albumin: 3.9 g/dL (ref 3.7–4.7)
Alkaline Phosphatase: 140 IU/L — ABNORMAL HIGH (ref 44–121)
BUN/Creatinine Ratio: 17 (ref 10–24)
BUN: 14 mg/dL (ref 8–27)
Bilirubin Total: <0.2 mg/dL (ref 0.0–1.2)
CO2: 26 mmol/L (ref 20–29)
Calcium: 9.4 mg/dL (ref 8.6–10.2)
Chloride: 97 mmol/L (ref 96–106)
Creatinine, Ser: 0.84 mg/dL (ref 0.76–1.27)
GFR calc Af Amer: 100 mL/min/{1.73_m2} (ref >59)
GFR calc non Af Amer: 86 mL/min/{1.73_m2} (ref >59)
Globulin, Total: 3.1 g/dL (ref 1.5–4.5)
Glucose: 129 mg/dL — ABNORMAL HIGH (ref 65–99)
Potassium: 4.2 mmol/L (ref 3.5–5.2)
Sodium: 135 mmol/L (ref 134–144)
Total Protein: 7 g/dL (ref 6.0–8.5)

## 2020-03-21 LAB — CBC WITH DIFFERENTIAL/PLATELET
Basophils Absolute: 0.1 10*3/uL (ref 0.0–0.2)
Basos: 1 %
EOS (ABSOLUTE): 0.2 10*3/uL (ref 0.0–0.4)
Eos: 3 %
Hematocrit: 41.5 % (ref 37.5–51.0)
Hemoglobin: 13.8 g/dL (ref 13.0–17.7)
Immature Grans (Abs): 0 10*3/uL (ref 0.0–0.1)
Immature Granulocytes: 0 %
Lymphocytes Absolute: 0.7 10*3/uL (ref 0.7–3.1)
Lymphs: 10 %
MCH: 30.1 pg (ref 26.6–33.0)
MCHC: 33.3 g/dL (ref 31.5–35.7)
MCV: 90 fL (ref 79–97)
Monocytes Absolute: 0.7 10*3/uL (ref 0.1–0.9)
Monocytes: 9 %
Neutrophils Absolute: 5.8 10*3/uL (ref 1.4–7.0)
Neutrophils: 77 %
Platelets: 390 10*3/uL (ref 150–450)
RBC: 4.59 x10E6/uL (ref 4.14–5.80)
RDW: 13.6 % (ref 11.6–15.4)
WBC: 7.5 10*3/uL (ref 3.4–10.8)

## 2020-03-21 LAB — TSH: TSH: 1.62 u[IU]/mL (ref 0.450–4.500)

## 2020-03-22 DIAGNOSIS — N401 Enlarged prostate with lower urinary tract symptoms: Secondary | ICD-10-CM | POA: Diagnosis not present

## 2020-03-22 DIAGNOSIS — N3000 Acute cystitis without hematuria: Secondary | ICD-10-CM | POA: Diagnosis not present

## 2020-03-22 DIAGNOSIS — T83511A Infection and inflammatory reaction due to indwelling urethral catheter, initial encounter: Secondary | ICD-10-CM | POA: Diagnosis not present

## 2020-03-22 DIAGNOSIS — R338 Other retention of urine: Secondary | ICD-10-CM | POA: Diagnosis not present

## 2020-03-22 DIAGNOSIS — E1142 Type 2 diabetes mellitus with diabetic polyneuropathy: Secondary | ICD-10-CM | POA: Diagnosis not present

## 2020-03-22 DIAGNOSIS — L84 Corns and callosities: Secondary | ICD-10-CM | POA: Diagnosis not present

## 2020-03-22 DIAGNOSIS — B351 Tinea unguium: Secondary | ICD-10-CM | POA: Diagnosis not present

## 2020-03-22 DIAGNOSIS — A4151 Sepsis due to Escherichia coli [E. coli]: Secondary | ICD-10-CM | POA: Diagnosis not present

## 2020-03-22 DIAGNOSIS — E1151 Type 2 diabetes mellitus with diabetic peripheral angiopathy without gangrene: Secondary | ICD-10-CM | POA: Diagnosis not present

## 2020-03-22 DIAGNOSIS — M79676 Pain in unspecified toe(s): Secondary | ICD-10-CM | POA: Diagnosis not present

## 2020-03-23 ENCOUNTER — Ambulatory Visit (HOSPITAL_COMMUNITY)
Admission: RE | Admit: 2020-03-23 | Discharge: 2020-03-23 | Disposition: A | Discharge status: Home / Self Care | Payer: Medicare Other | Source: Ambulatory Visit | Attending: Hematology | Admitting: Hematology

## 2020-03-23 ENCOUNTER — Inpatient Hospital Stay (HOSPITAL_COMMUNITY): Payer: Medicare Other | Attending: Hematology

## 2020-03-23 ENCOUNTER — Other Ambulatory Visit: Payer: Self-pay

## 2020-03-23 ENCOUNTER — Other Ambulatory Visit (HOSPITAL_COMMUNITY): Payer: Medicare Other

## 2020-03-23 DIAGNOSIS — Z923 Personal history of irradiation: Secondary | ICD-10-CM | POA: Diagnosis not present

## 2020-03-23 DIAGNOSIS — Z79899 Other long term (current) drug therapy: Secondary | ICD-10-CM | POA: Insufficient documentation

## 2020-03-23 DIAGNOSIS — Z823 Family history of stroke: Secondary | ICD-10-CM | POA: Insufficient documentation

## 2020-03-23 DIAGNOSIS — C771 Secondary and unspecified malignant neoplasm of intrathoracic lymph nodes: Secondary | ICD-10-CM | POA: Diagnosis not present

## 2020-03-23 DIAGNOSIS — Z8249 Family history of ischemic heart disease and other diseases of the circulatory system: Secondary | ICD-10-CM | POA: Diagnosis not present

## 2020-03-23 DIAGNOSIS — Z9221 Personal history of antineoplastic chemotherapy: Secondary | ICD-10-CM | POA: Insufficient documentation

## 2020-03-23 DIAGNOSIS — I509 Heart failure, unspecified: Secondary | ICD-10-CM | POA: Insufficient documentation

## 2020-03-23 DIAGNOSIS — J432 Centrilobular emphysema: Secondary | ICD-10-CM | POA: Diagnosis not present

## 2020-03-23 DIAGNOSIS — C3411 Malignant neoplasm of upper lobe, right bronchus or lung: Secondary | ICD-10-CM | POA: Diagnosis not present

## 2020-03-23 DIAGNOSIS — E785 Hyperlipidemia, unspecified: Secondary | ICD-10-CM | POA: Insufficient documentation

## 2020-03-23 DIAGNOSIS — M7989 Other specified soft tissue disorders: Secondary | ICD-10-CM | POA: Insufficient documentation

## 2020-03-23 DIAGNOSIS — Z85118 Personal history of other malignant neoplasm of bronchus and lung: Secondary | ICD-10-CM | POA: Diagnosis not present

## 2020-03-23 DIAGNOSIS — J449 Chronic obstructive pulmonary disease, unspecified: Secondary | ICD-10-CM | POA: Insufficient documentation

## 2020-03-23 DIAGNOSIS — R339 Retention of urine, unspecified: Secondary | ICD-10-CM | POA: Diagnosis not present

## 2020-03-23 DIAGNOSIS — I110 Hypertensive heart disease with heart failure: Secondary | ICD-10-CM | POA: Insufficient documentation

## 2020-03-23 DIAGNOSIS — I700 Atherosclerosis of aorta: Secondary | ICD-10-CM | POA: Diagnosis not present

## 2020-03-23 DIAGNOSIS — F17210 Nicotine dependence, cigarettes, uncomplicated: Secondary | ICD-10-CM | POA: Diagnosis not present

## 2020-03-23 DIAGNOSIS — M1990 Unspecified osteoarthritis, unspecified site: Secondary | ICD-10-CM | POA: Diagnosis not present

## 2020-03-23 DIAGNOSIS — E119 Type 2 diabetes mellitus without complications: Secondary | ICD-10-CM | POA: Diagnosis not present

## 2020-03-23 DIAGNOSIS — R410 Disorientation, unspecified: Secondary | ICD-10-CM | POA: Insufficient documentation

## 2020-03-23 DIAGNOSIS — I358 Other nonrheumatic aortic valve disorders: Secondary | ICD-10-CM | POA: Diagnosis not present

## 2020-03-23 DIAGNOSIS — C3491 Malignant neoplasm of unspecified part of right bronchus or lung: Secondary | ICD-10-CM | POA: Diagnosis not present

## 2020-03-23 DIAGNOSIS — G4730 Sleep apnea, unspecified: Secondary | ICD-10-CM | POA: Diagnosis not present

## 2020-03-23 DIAGNOSIS — E039 Hypothyroidism, unspecified: Secondary | ICD-10-CM | POA: Insufficient documentation

## 2020-03-23 DIAGNOSIS — Z833 Family history of diabetes mellitus: Secondary | ICD-10-CM | POA: Insufficient documentation

## 2020-03-23 DIAGNOSIS — Z87442 Personal history of urinary calculi: Secondary | ICD-10-CM | POA: Insufficient documentation

## 2020-03-23 DIAGNOSIS — I2510 Atherosclerotic heart disease of native coronary artery without angina pectoris: Secondary | ICD-10-CM | POA: Insufficient documentation

## 2020-03-23 LAB — COMPREHENSIVE METABOLIC PANEL
ALT: 17 U/L (ref 0–44)
AST: 19 U/L (ref 15–41)
Albumin: 3.6 g/dL (ref 3.5–5.0)
Alkaline Phosphatase: 107 U/L (ref 38–126)
Anion gap: 9 (ref 5–15)
BUN: 16 mg/dL (ref 8–23)
CO2: 28 mmol/L (ref 22–32)
Calcium: 9.2 mg/dL (ref 8.9–10.3)
Chloride: 99 mmol/L (ref 98–111)
Creatinine, Ser: 0.85 mg/dL (ref 0.61–1.24)
GFR, Estimated: >60 mL/min (ref >60)
Glucose, Bld: 146 mg/dL — ABNORMAL HIGH (ref 70–99)
Potassium: 3.8 mmol/L (ref 3.5–5.1)
Sodium: 136 mmol/L (ref 135–145)
Total Bilirubin: 0.5 mg/dL (ref 0.3–1.2)
Total Protein: 7.5 g/dL (ref 6.5–8.1)

## 2020-03-23 LAB — CBC WITH DIFFERENTIAL/PLATELET
Abs Immature Granulocytes: 0.01 10*3/uL (ref 0.00–0.07)
Basophils Absolute: 0.1 10*3/uL (ref 0.0–0.1)
Basophils Relative: 1 %
Eosinophils Absolute: 0.2 10*3/uL (ref 0.0–0.5)
Eosinophils Relative: 3 %
HCT: 43.5 % (ref 39.0–52.0)
Hemoglobin: 13.7 g/dL (ref 13.0–17.0)
Immature Granulocytes: 0 %
Lymphocytes Relative: 11 %
Lymphs Abs: 0.7 10*3/uL (ref 0.7–4.0)
MCH: 30.1 pg (ref 26.0–34.0)
MCHC: 31.5 g/dL (ref 30.0–36.0)
MCV: 95.6 fL (ref 80.0–100.0)
Monocytes Absolute: 0.7 10*3/uL (ref 0.1–1.0)
Monocytes Relative: 10 %
Neutro Abs: 5.1 10*3/uL (ref 1.7–7.7)
Neutrophils Relative %: 75 %
Platelets: 288 10*3/uL (ref 150–400)
RBC: 4.55 MIL/uL (ref 4.22–5.81)
RDW: 14.6 % (ref 11.5–15.5)
WBC: 6.8 10*3/uL (ref 4.0–10.5)
nRBC: 0 % (ref 0.0–0.2)

## 2020-03-23 LAB — TSH: TSH: 1.503 u[IU]/mL (ref 0.350–4.500)

## 2020-03-23 MED ORDER — IOHEXOL 300 MG/ML  SOLN
75.0000 mL | Freq: Once | INTRAMUSCULAR | Status: AC | PRN
  Administered 2020-03-23: 11:00:00 75 mL via INTRAVENOUS

## 2020-03-26 ENCOUNTER — Ambulatory Visit (HOSPITAL_COMMUNITY): Payer: Medicare Other | Admitting: Hematology

## 2020-03-26 ENCOUNTER — Other Ambulatory Visit: Payer: Self-pay | Admitting: Family

## 2020-03-27 ENCOUNTER — Telehealth: Payer: Self-pay

## 2020-03-28 DIAGNOSIS — E1151 Type 2 diabetes mellitus with diabetic peripheral angiopathy without gangrene: Secondary | ICD-10-CM | POA: Diagnosis not present

## 2020-03-28 DIAGNOSIS — N3000 Acute cystitis without hematuria: Secondary | ICD-10-CM | POA: Diagnosis not present

## 2020-03-28 DIAGNOSIS — T83511A Infection and inflammatory reaction due to indwelling urethral catheter, initial encounter: Secondary | ICD-10-CM | POA: Diagnosis not present

## 2020-03-28 DIAGNOSIS — R338 Other retention of urine: Secondary | ICD-10-CM | POA: Diagnosis not present

## 2020-03-28 DIAGNOSIS — A4151 Sepsis due to Escherichia coli [E. coli]: Secondary | ICD-10-CM | POA: Diagnosis not present

## 2020-03-28 DIAGNOSIS — N401 Enlarged prostate with lower urinary tract symptoms: Secondary | ICD-10-CM | POA: Diagnosis not present

## 2020-03-29 ENCOUNTER — Telehealth: Payer: Self-pay

## 2020-03-29 NOTE — Telephone Encounter (Signed)
Have him place bacitracin BID on meatus

## 2020-03-29 NOTE — Telephone Encounter (Signed)
Patient concerned about having surgery on 1/3 due to still having a catheter in place and recently being hospitalized for UTI sepsis. He has finished the course of antibiotics. He will see urology on 12/29 and says he will call us on 12/30 to inform us if he still would like to have surgery.

## 2020-04-02 NOTE — Progress Notes (Signed)
[name], [place] [address]   CLINIC:  Medical Oncology/Hematology  PCP:  John [name], [place] / [name] [id number] [phone]   REASON FOR VISIT:  Follow-up for right NSCLC  PRIOR THERAPY:  1. Chemoradiation with weekly carboplatin and paclitaxel from 06/25/2016 to 08/20/2016. 2. Consolidation durvalumab from 09/26/2016 to 09/25/2017.  NGS Results: Not done  CURRENT THERAPY: Surveillance  BRIEF ONCOLOGIC HISTORY:  Oncology History  Non-small cell lung [name], right ([name])  04/17/2016 Imaging   CT chest- 1. Examination is positive for bilateral upper lobe pulmonary lesions suspicious for malignancy. Further investigation with PET-CT and tissue sampling is advised. 2. Borderline enlarged paratracheal lymph nodes and right hilar nodes. Cannot rule out metastatic adenopathy. Attention to these areas on PET-CT is advised. 3. Emphysema 4. Aortic atherosclerosis and multi vessel coronary artery calcification.   04/18/2016 Initial Diagnosis   Adenocarcinoma of lung, right ([name])   04/24/2016 PET scan   1. Adjacent hypermetabolic nodules in the RIGHT upper lobe consistent bronchogenic carcinoma. 2. Suspicion of ipsilateral hilar and paratracheal nodal metastasis. 3. Nodule in the LEFT upper lobe with suspicious morphology has a relatively low metabolic activity for size. Favor synchronous primary bronchogenic carcinoma. 4. No metabolic activity associated with a small LEFT lower lobe pulmonary nodule. Recommend attention on follow-up.   05/06/2016 Imaging   MRI brain- Negative for metastatic disease.   05/28/2016 Procedure   1. Electromagnetic navigational bronchoscopy with brushings, needle     aspirations and biopsies of bilateral upper lobe nodules. 2. Endobronchial ultrasound with mediastinal and hilar lymph node     aspirations. By Dr. Roxan [name]   06/03/2016 Pathology Results   Diagnosis 1. Lung,  biopsy, Right upper lobe - BLOOD AND BENIGN BRONCHIAL AND LUNG TISSUE. 2. Lung, biopsy, Left upper lobe - BLOOD AND BENIGN BRONCHIAL AND LUNG TISSUE.   06/03/2016 Pathology Results   Diagnosis TRANSBRONCHIAL NEEDLE ASPIRATION, NAVIGATION, RUL (SPECIMEN 5 OF 7, COLLECTED 05/28/16): MALIGNANT CELLS CONSISTENT WITH NON-SMALL CELL CARCINOMA Comment There are limited tumor cells in the cell block (insufficient for molecular testing). TTF-1 is positive in a few of the atypical cells. They appear negative for synaptophysin and cytokeratin 5/6. Overall there is limited tumor, but a lung adenocarcinoma is slightly favored. Dr. Saralyn [name] has reviewed the case. The case was called to Dr. Roxan [name] on 06/03/2016.   06/13/2016 Procedure   Port placed by Dr. Arnoldo [name]   06/25/2016 - 08/15/2016 Chemotherapy   The patient had palonosetron (ALOXI) injection 0.25 mg, 0.25 mg, Intravenous,  Once, 6 of 6 cycles  CARBOplatin (PARAPLATIN) 220 mg in sodium chloride 0.9 % 250 mL chemo infusion, 220 mg (100 % of original dose 215.8 mg), Intravenous,  Once, 6 of 6 cycles Dose modification:   (original dose 215.8 mg, Cycle 1),   (original dose 215.8 mg, Cycle 5),   (original dose 215.8 mg, Cycle 6),   (original dose 215.8 mg, Cycle 2),   (original dose 215.8 mg, Cycle 3),   (original dose 215.8 mg, Cycle 4)  PACLitaxel (TAXOL) 90 mg in dextrose 5 % 250 mL chemo infusion ( for chemotherapy treatment.     06/25/2016 - 08/20/2016 Radiation Therapy   Radiation in [name], [name] by Dr. Quitman [name].  R lung to 6600 cGy   07/09/2016 - 07/10/2016 Hospital Admission   Admit date: 07/09/2016  Admission diagnosis: Fever and chills Additional comments: Negative work-up, discharged with course of Levaquin   07/09/2016 Treatment Plan Change  Treatment deferred x 1 week due to hospitalization    08/05/2016 - 08/06/2016 Hospital Admission   Admit date: 08/05/2016 Admission diagnosis: Joint pain Additional comments: Treated with steroids with  symptomatic improvement   08/25/2016 Imaging   MRI brain- Negative for intracranial metastasis on this motion degraded examination.  Stable mild-to-moderate chronic small vessel ischemic disease.   09/18/2016 PET scan   1. Partial metabolic response. Right upper lobe 2.6 cm hypermetabolic pulmonary nodule, decreased in size and metabolism. Mildly hypermetabolic right hilar nodal metastasis, decreased in metabolism. Right paratracheal lymph node is no longer hypermetabolic. No new or progressive metastatic disease. 2. Apical left upper lobe 0.9 cm solid pulmonary nodule, slightly decreased in size, with no significant metabolism. The change in size could indicate a neoplastic etiology for this nodule. 3. Additional subcentimeter pulmonary nodules in the left lung are stable and below PET resolution . 4. Additional findings include aortic atherosclerosis, coronary atherosclerosis, moderate emphysema, stable small pericardial effusion/thickening and mild sigmoid diverticulosis.   01/13/2017 Imaging   CT chest with contrast IMPRESSION: 1. No change in size of the hypermetabolic nodular thickening in the RIGHT upper lobe. 2. LEFT upper lobe spiculated nodule is stable. 3. Rounded LEFT lower lobe pulmonary nodule is stable. 4. No evidence of lung [name] progression.   05/08/2017 Adverse Reaction   Development of Hypothyroidism- likely secondary to immunotherapy.  Levothyroxine therapy initiated.      [name] STAGING: [name] Staging Non-small cell lung [name], right [facility]) Staging form: Lung, AJCC 8th Edition - Clinical stage from 06/05/2016: Stage IIIA (cT1b(2), cN2, cM0) - Signed by John Cancer, PA-C on 08/21/2016   INTERVAL HISTORY:  [name], a 74 y.o. male, returns for routine follow-up of his right NSCLC. John [name] was last seen on 11/24/2019.   Today he reports that he was placed on a Foley catheter in November when he had urinary retention.  He was also hospitalized  from 03/03/2020 through 03/05/2020 with UTI sepsis.  Denies any cough or hemoptysis.  Denies any new onset chest pains.  REVIEW OF SYSTEMS:  Review of Systems  Cardiovascular: Positive for leg swelling.  All other systems reviewed and are negative.   PAST MEDICAL/SURGICAL HISTORY:  Past Medical History:  Diagnosis Date  . Adenocarcinoma of lung, right ([place]) 04/18/2016  . Anxiety   . Arthritis   . Asthma   . CHF (congestive heart failure) ([place])   . COPD (chronic obstructive pulmonary disease) ([place])   . Depression   . Diabetes mellitus without complication (H[facility])    no meds  . DM (diabetes mellitus) ([facility] [facility]) 07/09/2016  . Dyspnea   . History of kidney stones   . Hyperlipidemia   . Hypertension   . Macular degeneration   . Neuropathy   . Non-small cell lung [name], right ([place]) 04/18/2016  . Prostatitis   . Pulmonary nodule, left 07/16/2016  . Sleep apnea    cpap   Past Surgical History:  Procedure Laterality Date  . ABDOMINAL AORTOGRAM W/LOWER EXTREMITY Left 02/06/2020   Procedure: ABDOMINAL AORTOGRAM W/LOWER EXTREMITY;  Surgeon: Lorretta Harp, MD;  Location: [facility];  Service: Cardiovascular;  Laterality: Left;  . CATARACT EXTRACTION, BILATERAL Bilateral   . NO PAST SURGERIES    . PORTACATH PLACEMENT Left 06/13/2016   Procedure: INSERTION PORT-A-CATH;  Surgeon: Aviva Signs, MD;  Location: [facility];  Service: General;  Laterality: Left;  Marland Kitchen VIDEO BRONCHOSCOPY WITH ENDOBRONCHIAL NAVIGATION N/A 05/28/2016   Procedure: VIDEO BRONCHOSCOPY WITH ENDOBRONCHIAL NAVIGATION;  Surgeon:  [name], MD;  Location: [facility];  Service: Thoracic;  Laterality: N/A;  . VIDEO BRONCHOSCOPY WITH ENDOBRONCHIAL ULTRASOUND N/A 05/28/2016   Procedure: VIDEO BRONCHOSCOPY WITH ENDOBRONCHIAL ULTRASOUND;  Surgeon: [name], MD;  Location: [facility];  Service: Thoracic;  Laterality: N/A;    SOCIAL HISTORY:  Social History   Socioeconomic History  . Marital status: Divorced    Spouse  name: Not on file  . Number of children: 5  . Years of education: 10  . Highest education level: 10th grade  Occupational History  . Occupation: Retired  Tobacco Use  . Smoking status: Current Every Day Smoker    Packs/day: 0.50    Years: 55.00    Pack years: 27.50    Types: Cigarettes    Start date: 03/11/1961  . Smokeless tobacco: Never Used  . Tobacco comment: 1/2 pack to 1 pack per day 12/14/19  Vaping Use  . Vaping Use: Never used  Substance and Sexual Activity  . Alcohol use: No  . Drug use: No  . Sexual activity: Yes    Birth control/protection: None  Other Topics Concern  . Not on file  Social History Narrative  . Not on file   Social Determinants of Health   Financial Resource Strain: Low Risk   . Difficulty of Paying Living Expenses: Not hard at all  Food Insecurity: No Food Insecurity  . Worried About Charity fundraiser in the Last Year: Never true  . Ran Out of Food in the Last Year: Never true  Transportation Needs: No Transportation Needs  . Lack of Transportation (Medical): No  . Lack of Transportation (Non-Medical): No  Physical Activity: Inactive  . Days of Exercise per Week: 0 days  . Minutes of Exercise per Session: 0 min  Stress: No Stress Concern Present  . Feeling of Stress : Not at all  Social Connections: Moderately Isolated  . Frequency of Communication with Friends and Family: More than three times a week  . Frequency of Social Gatherings with Friends and Family: More than three times a week  . Attends Religious Services: Never  . Active Member of Clubs or Organizations: No  . Attends Archivist Meetings: Never  . Marital Status: Living with partner  Intimate Partner Violence: Not At Risk  . Fear of Current or Ex-Partner: No  . Emotionally Abused: No  . Physically Abused: No  . Sexually Abused: No    FAMILY HISTORY:  Family History  Problem Relation Age of Onset  . Hypertension Mother   . Diabetes Father   . Heart disease  Father   . Stroke Father   . Hypertension Sister     CURRENT MEDICATIONS:  Current Outpatient Medications  Medication Sig Dispense Refill  . albuterol (VENTOLIN HFA) 108 (90 Base) MCG/ACT inhaler Inhale 2 puffs into the lungs every 4 (four) hours as needed for wheezing or shortness of breath. 8 g 6  . amLODipine (NORVASC) 5 MG tablet Take 1 tablet (5 mg total) by mouth daily. 90 tablet 3  . aspirin EC 81 MG tablet Take 81 mg by mouth daily.    . Blood Glucose Monitoring Suppl (ONETOUCH VERIO REFLECT) w/Device KIT Use to test blood sugars daily as directed. DX: E11.9 1 kit 1  . cloNIDine (CATAPRES) 0.1 MG tablet TAKE 1 TABLET (0.1 MG TOTAL) BY MOUTH 3 (THREE) TIMES DAILY. 270 tablet 1  . dutasteride (AVODART) 0.5 MG capsule TAKE 1 CAPSULE BY MOUTH EVERY DAY (Patient taking  differently: Take 0.5 mg by mouth daily.) 90 capsule 1  . furosemide (LASIX) 20 MG tablet Take 1 tablet (20 mg total) by mouth 2 (two) times daily. (Patient taking differently: Take 20 mg by mouth daily.) 180 tablet 1  . gabapentin (NEURONTIN) 300 MG capsule Take 1 capsule (300 mg total) by mouth 3 (three) times daily. (Patient taking differently: Take 300-600 mg by mouth See admin instructions. Take 1 capsule (300 mg) by mouth in the morning & take 2 capsules (600 mg) by mouth in the evening.) 270 capsule 2  . glucose blood (ONETOUCH VERIO) test strip Use to test blood sugars daily as directed. DX: E11.9 100 each 12  . levothyroxine (SYNTHROID) 75 MCG tablet Take 1 tablet (75 mcg total) by mouth daily. 90 tablet 3  . linaclotide (LINZESS) 72 MCG capsule Take 1 capsule (72 mcg total) by mouth daily before breakfast. (Patient taking differently: Take 72 mcg by mouth daily as needed (constipation.).) 30 capsule 2  . meloxicam (MOBIC) 7.5 MG tablet Take 1 tablet (7.5 mg total) by mouth daily as needed for pain (back pain). 90 tablet 0  . metFORMIN (GLUCOPHAGE-XR) 500 MG 24 hr tablet Take 1 tablet (500 mg total) by mouth daily with  breakfast. 90 tablet 1  . metoprolol succinate (TOPROL XL) 25 MG 24 hr tablet Take 0.5 tablets (12.5 mg total) by mouth daily. 45 tablet 3  . mupirocin ointment (BACTROBAN) 2 % Apply 1 application topically 3 (three) times daily as needed (wound care.).    Marland Kitchen olmesartan (BENICAR) 40 MG tablet Take 1 tablet (40 mg total) by mouth daily. 30 tablet 11  . ondansetron (ZOFRAN ODT) 4 MG disintegrating tablet Take 1 tablet (4 mg total) by mouth every 8 (eight) hours as needed for nausea or vomiting. 20 tablet 0  . OneTouch Delica Lancets 16S MISC Use to test blood sugars daily as directed. DX: E11.9 100 each 1  . pravastatin (PRAVACHOL) 40 MG tablet Take 1 tablet (40 mg total) by mouth every Monday, Wednesday, and Friday. At night 90 tablet 1  . Semaglutide (RYBELSUS) 7 MG TABS Take 7 mg by mouth daily. 90 tablet 1  . tamsulosin (FLOMAX) 0.4 MG CAPS capsule Take 2 capsules (0.8 mg total) by mouth daily after supper. 120 capsule 1  . TRELEGY ELLIPTA 100-62.5-25 MCG/INH AEPB Inhale 1 puff into the lungs daily. 1 each 3   No current facility-administered medications for this visit.    ALLERGIES:  Allergies  Allergen Reactions  . Jardiance [Empagliflozin]     FEELS SLUGGISH, TIRED  . Lopressor [Metoprolol]     Fatigue    PHYSICAL EXAM:  Performance status (ECOG): 1 - Symptomatic but completely ambulatory  There were no vitals filed for this visit. Wt Readings from Last 3 Encounters:  03/20/20 169 lb 3.2 oz (76.7 kg)  03/13/20 163 lb (73.9 kg)  03/09/20 175 lb (79.4 kg)   Physical Exam Vitals reviewed.  Constitutional:      Appearance: Normal appearance.  Cardiovascular:     Rate and Rhythm: Normal rate and regular rhythm.     Heart sounds: Normal heart sounds.  Pulmonary:     Breath sounds: Normal breath sounds.  Skin:    General: Skin is warm.  Neurological:     Mental Status: He is alert and oriented to person, place, and time.  Psychiatric:        Mood and Affect: Mood normal.         Behavior: Behavior normal.  LABORATORY DATA:  I have reviewed the labs as listed.  CBC Latest Ref Rng & Units 03/23/2020 03/20/2020 03/04/2020  WBC 4.0 - 10.5 K/uL 6.8 7.5 8.7  Hemoglobin 13.0 - 17.0 g/dL 13.7 13.8 12.5(L)  Hematocrit 39.0 - 52.0 % 43.5 41.5 40.3  Platelets 150 - 400 K/uL 288 390 195   CMP Latest Ref Rng & Units 03/23/2020 03/20/2020 03/04/2020  Glucose 70 - 99 mg/dL 146(H) 129(H) 134(H)  BUN 8 - 23 mg/dL 16 14 16   Creatinine 0.61 - 1.24 mg/dL 0.85 0.84 0.97  Sodium 135 - 145 mmol/L 136 135 135  Potassium 3.5 - 5.1 mmol/L 3.8 4.2 3.4(L)  Chloride 98 - 111 mmol/L 99 97 101  CO2 22 - 32 mmol/L 28 26 24   Calcium 8.9 - 10.3 mg/dL 9.2 9.4 8.4(L)  Total Protein 6.5 - 8.1 g/dL 7.5 7.0 -  Total Bilirubin 0.3 - 1.2 mg/dL 0.5 <0.2 -  Alkaline Phos 38 - 126 U/L 107 140(H) -  AST 15 - 41 U/L 19 23 -  ALT 0 - 44 U/L 17 20 -    DIAGNOSTIC IMAGING:  I have independently reviewed the scans and discussed with the patient. CT Chest W Contrast  Result Date: 03/25/2020 CLINICAL DATA:  74 year old male with history of non-small cell lung [name] diagnosed in January 2018. Status post chemotherapy and radiation therapy. Follow-up study. EXAM: CT CHEST WITH CONTRAST TECHNIQUE: Multidetector CT imaging of the chest was performed during intravenous contrast administration. CONTRAST:  85m OMNIPAQUE IOHEXOL 300 MG/ML  SOLN COMPARISON:  Chest CT 11/07/2019. FINDINGS: Cardiovascular: Heart size is normal. Small amount of pericardial thickening calcification, particularly adjacent to the ascending thoracic aorta. No significant volume of pericardial fluid. There is aortic atherosclerosis, as well as atherosclerosis of the great vessels of the mediastinum and the coronary arteries, including calcified atherosclerotic plaque in the left main, left anterior descending and right coronary arteries. Moderate calcifications of the aortic valve and severe calcifications of the mitral annulus.  Mediastinum/Nodes: No pathologically enlarged mediastinal or hilar lymph nodes. Esophagus is unremarkable in appearance. No axillary lymphadenopathy. Lungs/Pleura: In the central aspect of the right upper lobe, and to a lesser extent in the posterior aspect of the superior segment of the right lower lobe there are chronic areas of volume loss and architectural distortion which are very similar to prior examinations, most compatible with areas of chronic postradiation masslike fibrosis. Several small pulmonary nodules are again noted in the lungs bilaterally. Many of these are stable compared to prior examinations, however, the largest of these nodules in the left upper lobe (axial image 35 of series 4) measuring 7 x 7 mm (previously only 6 x 4 mm on 11/07/2019) and in the right upper lobe (axial image 48 of series 4) measuring 11 x 7 mm (previously 10 x 5 mm) have both increased in size. An additional poorly defined nodule in the posterior aspect of the left upper lobe (axial image 27 of series 4) is far more conspicuous and currently measures 8 x 5 mm. No acute consolidative airspace disease. No pleural effusions. Diffuse bronchial wall thickening with mild centrilobular and paraseptal emphysema. Upper Abdomen: Aortic atherosclerosis. Tiny calcified granuloma in the right lobe of the liver incidentally noted. Musculoskeletal: There are no aggressive appearing lytic or blastic lesions noted in the visualized portions of the skeleton. IMPRESSION: 1. Small pulmonary nodules in the lungs bilaterally demonstrating persistent mild progression compared to prior examinations, concerning for metastatic lesions. 2. Stable postradiation changes in the right  lung, similar to prior examinations. 3. Diffuse bronchial wall thickening with mild centrilobular and paraseptal emphysema; imaging findings suggestive of underlying COPD. 4. Aortic atherosclerosis, in addition to left main and 2 vessel coronary artery disease. Assessment  for potential risk factor modification, dietary therapy or pharmacologic therapy may be warranted, if clinically indicated. 5. There are calcifications of the aortic valve and mitral annulus. Echocardiographic correlation for evaluation of potential valvular dysfunction may be warranted if clinically indicated. Aortic Atherosclerosis (ICD10-I70.0) and Emphysema (ICD10-J43.9). Electronically Signed   By: Vinnie Langton M.D.   On: 03/25/2020 07:57   DG Chest Port 1 View  Result Date: 03/04/2020 CLINICAL DATA:  Confusion. Expiratory wheezes. Decreased oxygenation. EXAM: PORTABLE CHEST 1 VIEW COMPARISON:  03/03/2020 FINDINGS: Left central venous catheter is unchanged in position. Borderline heart size. Coarse peribronchial changes consistent with chronic bronchitis. Suggestion of developing peripheral interstitial process possibly representing developing edema. Multifocal pneumonia less likely. No pleural effusions. No pneumothorax. Calcification of the aorta. IMPRESSION: Chronic bronchitic changes with suggestion of developing peripheral interstitial edema. Electronically Signed   By: Lucienne Capers M.D.   On: 03/04/2020 05:16     ASSESSMENT:  1. Stage IIIa (T1b N2 cM0) NSCLC, favoring adenocarcinoma: -Chemo XRT with weekly carboplatin and paclitaxel completed on 08/20/2016. -Consolidation durvalumab from 09/26/2016 through 09/25/2017. -Reviewed CT chest on 11/07/2019 which showed increasing size of the upper lobe pulmonary nodules with no new nodule in the left upper lobe suspicious for meta stasis. Also increasing confluent soft tissue in the right suprahilar region, thought to be postradiation changes. -PET scan on 11/21/2019 showed suspected radiation changes in the right paramediastinal/perihilar region although mild hypermetabolism is present in the right retroareolar region.SUV 2.5.  While progressive soft tissue is present in this region, this hypermetabolism remains nonfocal and favors radiation  changes.  7 mm central left upper lobe nodule max SUV 1.4.  9 mm aggregate nodule in the lateral right upper lobe max SUV 2.4.  These are minimally progressive from more remote prior scans.  6 mm left lower lobe nodule unchanged.  No hypermetabolic thoracic adenopathy. -CT chest on 03/23/2020 showed many of the lung nodules are stable.  Couple of nodules have grown by few millimeters.  No new areas seen.   PLAN:  1. Stage IIIa (T1b N2 cM0) NSCLC, favoring adenocarcinoma: -I have reviewed results and images of the CT chest with the patient. -Very minimal growth in a couple of nodules, by 1 to 2 mm. -Recommend further follow-up with a PET CT scan in 4 months.  2. Hypothyroidism: -Continue Synthroid.  TSH is 1.503.  3. Difficulty staying asleep: -Continue temazepam as needed.   Orders placed this encounter:  No orders of the defined types were placed in this encounter.    Derek Jack, MD [place] [phone]   I, Milinda Antis, am acting as a scribe for Dr. Sanda Linger.  I, Derek Jack MD, have reviewed the above documentation for accuracy and completeness, and I agree with the above.

## 2020-04-03 ENCOUNTER — Other Ambulatory Visit: Payer: Self-pay

## 2020-04-03 ENCOUNTER — Inpatient Hospital Stay (HOSPITAL_BASED_OUTPATIENT_CLINIC_OR_DEPARTMENT_OTHER): Payer: Medicare Other | Admitting: Hematology

## 2020-04-03 VITALS — BP 157/74 | HR 108 | Temp 97.0°F | Resp 18 | Wt 171.1 lb

## 2020-04-03 DIAGNOSIS — C771 Secondary and unspecified malignant neoplasm of intrathoracic lymph nodes: Secondary | ICD-10-CM | POA: Diagnosis not present

## 2020-04-03 DIAGNOSIS — R339 Retention of urine, unspecified: Secondary | ICD-10-CM | POA: Diagnosis not present

## 2020-04-03 DIAGNOSIS — C3411 Malignant neoplasm of upper lobe, right bronchus or lung: Secondary | ICD-10-CM | POA: Diagnosis not present

## 2020-04-03 DIAGNOSIS — I2510 Atherosclerotic heart disease of native coronary artery without angina pectoris: Secondary | ICD-10-CM | POA: Diagnosis not present

## 2020-04-03 DIAGNOSIS — C3491 Malignant neoplasm of unspecified part of right bronchus or lung: Secondary | ICD-10-CM

## 2020-04-03 DIAGNOSIS — E039 Hypothyroidism, unspecified: Secondary | ICD-10-CM | POA: Diagnosis not present

## 2020-04-03 DIAGNOSIS — I700 Atherosclerosis of aorta: Secondary | ICD-10-CM | POA: Diagnosis not present

## 2020-04-03 NOTE — Progress Notes (Signed)
Port flushed without difficulty. Good blood return noted. Pt left ambulatory, in satisfactory condition and band aid applied

## 2020-04-03 NOTE — Patient Instructions (Signed)
[facility] at [facility] Discharge Instructions  You were seen today by Dr. Katragadda. Follow up as scheduled.   Thank you for choosing [facility] at [facility] to provide your oncology and hematology care.  To afford each patient quality time with our provider, please arrive at least 15 minutes before your scheduled appointment time.   If you have a lab appointment with the Cancer Center please come in thru the Main Entrance and check in at the main information desk.  You need to re-schedule your appointment should you arrive 10 or more minutes late.  We strive to give you quality time with our providers, and arriving late affects you and other patients whose appointments are after yours.  Also, if you no show three or more times for appointments you may be dismissed from the clinic at the providers discretion.     Again, thank you for choosing [facility].  Our hope is that these requests will decrease the amount of time that you wait before being seen by our physicians.       _____________________________________________________________  Should you have questions after your visit to [facility], please contact our office at [phone] and follow the prompts.  Our office hours are 8:00 a.m. and 4:30 p.m. Monday - Friday.  Please note that voicemails left after 4:00 p.m. may not be returned until the following business day.  We are closed weekends and major holidays.  You do have access to a nurse 24-7, just call the main number to the clinic [phone] and do not press any options, hold on the line and a nurse will answer the phone.    For prescription refill requests, have your pharmacy contact our office and allow 72 hours.    Due to Covid, you will need to wear a mask upon entering the hospital. If you do not have a mask, a mask will be given to you at the Main Entrance upon arrival. For doctor visits, patients  may have 1 support person age 18 or older with them. For treatment visits, patients can not have anyone with them due to social distancing guidelines and our immunocompromised population.      

## 2020-04-04 ENCOUNTER — Ambulatory Visit: Payer: Medicare Other | Admitting: Urology

## 2020-04-04 ENCOUNTER — Encounter (HOSPITAL_COMMUNITY): Payer: Self-pay | Admitting: Vascular Surgery

## 2020-04-04 ENCOUNTER — Telehealth: Payer: Self-pay

## 2020-04-04 ENCOUNTER — Other Ambulatory Visit: Payer: Self-pay

## 2020-04-04 NOTE — Progress Notes (Signed)
Anesthesia Chart Review: SAME DAY WORK-UP   Case: [id number] Date/Time: 04/09/20 1000   Procedures:      RIGHT FEMORAL ENDARTERECTOMY (Right )     INSERTION OF RIGHT RETROGRADE ILIAC STENT (Right )   Anesthesia type: General   Pre-op diagnosis: PAD   Location: [facility] OR ROOM 16 / [place] [facility]   Surgeons: Marty Heck, MD      DISCUSSION: Patient is a 74 year old male scheduled for the above procedure. He was referred for this procedure by cardiologist Dr. Gwenlyn Found following results of 02/06/20 peripheral angiography.  History includes smoking, COPD, DM2, HLD, lung cancer (stage IIIa right non-small cell lung cancer; s/p Port-a-cath 06/13/16, chemoradiation through May 2018, durvalumab through 09/25/17), dyspnea, HTN, PAD, CHF, coronary calcifications (on CT, 2019 NST: scar versus soft tissue with no ischemia), OSA (CPAP), asthma, neuropathy, prostatitis, macular degeneration, hypothyroidism, BPH (with urinary retention following 02/06/20 peripheral angiography and required home foley catheter which was then complicated by urosepsis with hospitalization 03/03/20-03/05/20, had prolonged QT on EKG in ED).   Preoperative COVID-19 test is scheduled for 04/06/20. He is a same day work-up, so labs and anesthesia team evaluation on the day of surgery. (Of note, there is a notation from 04/05/20 that [facility] may be collecting a urine specimen from his catheter due to concern for UTI. He also has surgeon orders for UA with other labs for the morning of surgery.)   VS: Ht _0  (1.676 m)   Wt 77.6 kg   BMI 27.60 kg/m   BP Readings from Last 3 Encounters:  04/03/20 (!) 157/74  03/20/20 125/76  03/13/20 (!) 155/66   Pulse Readings from Last 3 Encounters:  04/03/20 (!) 108  03/20/20 98  03/13/20 93    PROVIDERS: [name], FNP is PCP - Quay Burow, MD is PV cardiologist. Previously saw [name], MD as primary cardiologist, who is no longer with the practice. He saw Bernerd Pho, PA-C  on 12/06/19 with indication that he would be assigned to primary cardiologist Carlyle Dolly, MD. - Derek Jack, MD is HEM-ONC. Last evaluation 04/03/20.  Star Age, MD is neurologist (OSA) - Nicolette Bang, MD is urologist. Last evaluation 03/09/20.   LABS: For day of surgery. Most recent results include: Lab Results  Component Value Date   WBC 6.8 03/23/2020   HGB 13.7 03/23/2020   HCT 43.5 03/23/2020   PLT 288 03/23/2020   GLUCOSE 146 (H) 03/23/2020   ALT 17 03/23/2020   AST 19 03/23/2020   NA 136 03/23/2020   K 3.8 03/23/2020   CL 99 03/23/2020   CREATININE 0.85 03/23/2020   BUN 16 03/23/2020   CO2 28 03/23/2020   TSH 1.503 03/23/2020   INR 1.1 03/03/2020   HGBA1C 6.8 03/20/2020    IMAGES: CT Chest 03/23/20: IMPRESSION: 1. Small pulmonary nodules in the lungs bilaterally demonstrating persistent mild progression compared to prior examinations, concerning for metastatic lesions. 2. Stable postradiation changes in the right lung, similar to prior examinations. 3. Diffuse bronchial wall thickening with mild centrilobular and paraseptal emphysema; imaging findings suggestive of underlying COPD. 4. Aortic atherosclerosis, in addition to left main and 2 vessel coronary artery disease. Assessment for potential risk factor modification, dietary therapy or pharmacologic therapy may be warranted, if clinically indicated. 5. There are calcifications of the aortic valve and mitral annulus. Echocardiographic correlation for evaluation of potential valvular dysfunction may be warranted if clinically indicated.  PET Scan 11/21/19: IMPRESSION: - Small bilateral pulmonary nodules in  the upper lobes may be mildly progressive from more remote priors and demonstrate mild hypermetabolism, raising concern for early metastases. Continued attention on follow-up is suggested. - Suspected radiation changes in the right paramediastinal/perihilar region, as described  above. Additional radiation changes in the right lower lobe. [See full report for details]   EKG: 03/03/20: Sinus tachycardia at 116 bpm Low voltage, precordial leads Consider inferior infarct Prolonged QT interval (QT 435/QTc 605 ms) Baseline wander in lead(s) V2 V5 V6 Confirmed by Ripley Fraise [phone]) on 03/03/2020 5:12:02 AM   CV: Nuclear stress test 03/12/18:  No diagnostic ST segment changes to indicate ischemia.  Medium sized, severe intensity, inferior defect extending from apex to base with inferior septal defect at the base. These regions are fixed and suggestive of scar versus soft tissue attenuation.  This is an intermediate risk study.  Nuclear stress EF: 48%. Visually, LVEF appears normal, consider echocardiogram if not done recently. (Per Dr. Bronson Ing, "No significant blockages. Symptoms likely due to COPD based on this study."; Comparison NST on 05/05/16 showed small, mild intensity, partially reversible inferior defect suggesting small ischemic territory, low risk study, EF 61%)  Echo 01/15/18:  Study Conclusions  - Left ventricle: The cavity size was normal. Wall thickness was  normal. Systolic function was normal. The estimated ejection  fraction was in the range of 60% to 65%. Although no diagnostic  regional wall motion abnormality was identified, this possibility  cannot be completely excluded on the basis of this study.  Indeterminate diastolic function.  - Aortic valve: Mildly calcified annulus. Probably trileaflet;  moderately calcified leaflets.  - Mitral valve: Moderately calcified annulus. There was trivial  regurgitation.  - Right ventricle: Systolic function was mildly reduced.  - Right atrium: Central venous pressure (est): 8 mm Hg.  - Tricuspid valve: There was trivial regurgitation.  - Pulmonary arteries: Systolic pressure could not be accurately  estimated.  - Pericardium, extracardiac: A prominent pericardial fat pad was   present. Cannot exclude small anteroapical pericardial effusion  with signs of chronicity.    Past Medical History:  Diagnosis Date  . Adenocarcinoma of lung, right ([place]) 04/18/2016  . Anxiety   . Arthritis   . Asthma   . BPH (benign prostatic hyperplasia)    with urinary retention 02/06/20  . CHF (congestive heart failure) ([place])   . COPD (chronic obstructive pulmonary disease) ([place])   . Depression   . Diabetes mellitus without complication (HCC)    no meds  . DM (diabetes mellitus) ([place]) 07/09/2016  . Dyspnea   . History of kidney stones   . Hyperlipidemia   . Hypertension   . Hypothyroidism   . Macular degeneration   . Neuropathy   . Non-small cell lung cancer, right ([place]) 04/18/2016  . Peripheral vascular disease ([place])   . Prostatitis   . Pulmonary nodule, left 07/16/2016  . Sleep apnea    cpap    Past Surgical History:  Procedure Laterality Date  . ABDOMINAL AORTOGRAM W/LOWER EXTREMITY Left 02/06/2020   Procedure: ABDOMINAL AORTOGRAM W/LOWER EXTREMITY;  Surgeon: Lorretta Harp, MD;  Location: [place] [facility];  Service: Cardiovascular;  Laterality: Left;  . CATARACT EXTRACTION, BILATERAL Bilateral   . NO PAST SURGERIES    . PORTACATH PLACEMENT Left 06/13/2016   Procedure: INSERTION PORT-A-CATH;  Surgeon: Aviva Signs, MD;  Location: [facility];  Service: General;  Laterality: Left;  Marland Kitchen VIDEO BRONCHOSCOPY WITH ENDOBRONCHIAL NAVIGATION N/A 05/28/2016   Procedure: VIDEO BRONCHOSCOPY WITH ENDOBRONCHIAL NAVIGATION;  Surgeon: Remo Lipps  Chaya Jan, MD;  Location: [place] [place];  Service: Thoracic;  Laterality: N/A;  . VIDEO BRONCHOSCOPY WITH ENDOBRONCHIAL ULTRASOUND N/A 05/28/2016   Procedure: VIDEO BRONCHOSCOPY WITH ENDOBRONCHIAL ULTRASOUND;  Surgeon: [name], MD;  Location: [place];  Service: Thoracic;  Laterality: N/A;    MEDICATIONS: No current facility-administered medications for this encounter.   Marland Kitchen albuterol (VENTOLIN HFA) 108 (90 Base) MCG/ACT inhaler  .  amLODipine (NORVASC) 5 MG tablet  . aspirin EC 81 MG tablet  . cloNIDine (CATAPRES) 0.1 MG tablet  . dutasteride (AVODART) 0.5 MG capsule  . furosemide (LASIX) 20 MG tablet  . gabapentin (NEURONTIN) 300 MG capsule  . levothyroxine (SYNTHROID) 75 MCG tablet  . linaclotide (LINZESS) 72 MCG capsule  . meloxicam (MOBIC) 7.5 MG tablet  . metFORMIN (GLUCOPHAGE-XR) 500 MG 24 hr tablet  . metoprolol succinate (TOPROL XL) 25 MG 24 hr tablet  . mupirocin ointment (BACTROBAN) 2 %  . olmesartan (BENICAR) 40 MG tablet  . ondansetron (ZOFRAN ODT) 4 MG disintegrating tablet  . pravastatin (PRAVACHOL) 40 MG tablet  . Semaglutide (RYBELSUS) 7 MG TABS  . tamsulosin (FLOMAX) 0.4 MG CAPS capsule  . TRELEGY ELLIPTA 100-62.5-25 MCG/INH AEPB  . Blood Glucose Monitoring Suppl (ONETOUCH VERIO REFLECT) w/Device KIT  . glucose blood (ONETOUCH VERIO) test strip  . OneTouch Delica Lancets 23R MISC    Myra Gianotti, PA-C Surgical Short Stay/Anesthesiology [facility] Phone [phone] [facility] Phone [phone] 04/05/2020 10:15 AM

## 2020-04-04 NOTE — Telephone Encounter (Signed)
Pt called concerned due to him being rescheduled to Jan. 13th. Pt said his cath had been in for a month. I spoke with other nurses. His Urodynamics were done on Dec. 10th so this would be 2 more weeks pt would have cath in. I assured pt 2 more weeks should not hurt anything.

## 2020-04-04 NOTE — Telephone Encounter (Signed)
Pt.notified

## 2020-04-04 NOTE — Telephone Encounter (Signed)
PAT told patient to call and ask about ASA and hospital stay. Confirmed with patient that he will continue taking aspirin and will have at least an overnight stay in the hospital. Patient verbalized understanding.

## 2020-04-04 NOTE — Progress Notes (Addendum)
PCP - [place] - Evelina Dun, FNP Cardiologist - Bernerd Pho, Pa-C  Chest x-ray - 03/04/20 EKG - 03/05/20 ECHO - 01/15/18 Sleep Study - yes  CPAP - yes 13 Fasting Blood Sugar:  100s Checks Blood Sugar:  1x/day  Blood Thinner Instructions: n/a Aspirin Instructions: Follow your surgeon's instructions on when to stop Aspirin.  If no instructions were given by your surgeon then you will need to call the office to get those instructions.     COVID TEST- 04/06/20 Anesthesia review: yes recent UTI sepsis hospitalization, cardiac hx  -------------  SDW INSTRUCTIONS:  Your procedure is scheduled on 04/09/20 Monday. Report to [facility] Main Entrance "A" at [email or web address]., and check in at the Admitting office.  Call this number if you have problems the morning of surgery: [phone]  Remember: Do not eat or drink after midnight the night before your surgery  Medications to take morning of surgery:  amLODipine (NORVASC) cloNIDine (CATAPRES) dutasteride (AVODART) gabapentin (NEURONTIN) levothyroxine (SYNTHROID)  metoprolol succinate (TOPROL XL)  pravastatin (PRAVACHOL)  TRELEGY ELLIPTA  ---- Please bring all inhalers with you the day of surgery.   Follow your surgeon's instructions on when to stop Aspirin.  If no instructions were given by your surgeon then you will need to call the office to get those instructions.    As of today, STOP taking any meloxicam (MOBIC), Aleve, Naproxen, Ibuprofen, Motrin, Advil, Goody's, BC's, all herbal medications, fish oil, and all vitamins.  ** PLEASE check your blood sugar the morning of your surgery when you wake up and every 2 hours until you get to the Short Stay unit.  If your blood sugar is less than 70 mg/dL, you will need to treat for low blood sugar: - Do not take insulin. - Treat a low blood sugar (less than 70 mg/dL) with  cup of clear juice (cranberry or apple), 4 glucose tablets, OR glucose gel. - Recheck blood sugar in 15  minutes after treatment (to make sure it is greater than 70 mg/dL). If your blood sugar is not greater than 70 mg/dL on recheck, call [phone] for further instructions.    The Morning of Surgery  Do not wear jewelry  Do not wear lotions, powders, colognes, or deodorant Men may shave face and neck.  Do not bring valuables to the hospital.  [facility] is not responsible for any belongings or valuables.  If you are a smoker, DO NOT Smoke 24 hours prior to surgery  If you wear a CPAP at night please bring your mask the morning of surgery   Remember that you must have someone to transport you home after your surgery, and remain with you for 24 hours if you are discharged the same day.  Please bring cases for contacts, glasses, hearing aids, dentures or bridgework because it cannot be worn into surgery.   Leave your suitcase in the car.  After surgery it may be brought to your room.  For patients admitted to the hospital, discharge time will be determined by your treatment team.  Patients discharged the day of surgery will not be allowed to drive home.    Special instructions:   [place]- Preparing For Surgery  Oral Hygiene is also important to reduce your risk of infection.  Remember - BRUSH YOUR TEETH THE MORNING OF SURGERY WITH YOUR REGULAR TOOTHPASTE  Please follow these instructions carefully.   1. Shower the NIGHT BEFORE SURGERY and the MORNING OF SURGERY with DIAL Soap.   2.  Wash thoroughly, paying special attention to the area where your surgery will be performed.  3. Thoroughly rinse your body with warm water from the neck down.  4. Pat yourself dry with a CLEAN TOWEL.  5. Wear CLEAN PAJAMAS to bed the night before surgery  6. Place CLEAN SHEETS on your bed the night of your first shower and DO NOT SLEEP WITH PETS.  7. Wear comfortable clothes the morning of surgery.   Day of Surgery:  Please shower the morning of surgery with the DIAL soap Do not apply any  deodorants/lotions. Please wear clean clothes to the hospital/surgery center.   Remember to brush your teeth WITH YOUR REGULAR TOOTHPASTE.  Patient denies shortness of breath, fever, cough and chest pain.

## 2020-04-05 ENCOUNTER — Telehealth: Payer: Self-pay | Admitting: *Deleted

## 2020-04-05 ENCOUNTER — Other Ambulatory Visit: Payer: Medicare Other

## 2020-04-05 ENCOUNTER — Encounter (HOSPITAL_COMMUNITY): Payer: Self-pay | Admitting: Vascular Surgery

## 2020-04-05 ENCOUNTER — Other Ambulatory Visit: Payer: Self-pay | Admitting: Nurse Practitioner

## 2020-04-05 ENCOUNTER — Telehealth: Payer: Self-pay

## 2020-04-05 DIAGNOSIS — R8290 Unspecified abnormal findings in urine: Secondary | ICD-10-CM

## 2020-04-05 DIAGNOSIS — T83511A Infection and inflammatory reaction due to indwelling urethral catheter, initial encounter: Secondary | ICD-10-CM | POA: Diagnosis not present

## 2020-04-05 DIAGNOSIS — A4151 Sepsis due to Escherichia coli [E. coli]: Secondary | ICD-10-CM | POA: Diagnosis not present

## 2020-04-05 DIAGNOSIS — N401 Enlarged prostate with lower urinary tract symptoms: Secondary | ICD-10-CM | POA: Diagnosis not present

## 2020-04-05 DIAGNOSIS — N3000 Acute cystitis without hematuria: Secondary | ICD-10-CM | POA: Diagnosis not present

## 2020-04-05 DIAGNOSIS — R338 Other retention of urine: Secondary | ICD-10-CM | POA: Diagnosis not present

## 2020-04-05 DIAGNOSIS — E1151 Type 2 diabetes mellitus with diabetic peripheral angiopathy without gangrene: Secondary | ICD-10-CM | POA: Diagnosis not present

## 2020-04-05 LAB — URINALYSIS, COMPLETE
Bilirubin, UA: NEGATIVE
Glucose, UA: NEGATIVE
Ketones, UA: NEGATIVE
Nitrite, UA: POSITIVE — AB
Specific Gravity, UA: 1.01 (ref 1.005–1.030)
Urobilinogen, Ur: 0.2 mg/dL (ref 0.2–1.0)
pH, UA: 5.5 (ref 5.0–7.5)

## 2020-04-05 LAB — MICROSCOPIC EXAMINATION: Epithelial Cells (non renal): NONE SEEN /hpf (ref 0–10)

## 2020-04-05 MED ORDER — DOXYCYCLINE HYCLATE 100 MG PO TABS: 100.0000 mg | ORAL_TABLET | Freq: Two times a day (BID) | ORAL | 0 refills | Status: DC

## 2020-04-05 NOTE — Telephone Encounter (Signed)
Urine shows infection- doxyxyxline sent to pharmacy

## 2020-04-05 NOTE — Telephone Encounter (Signed)
Patient aware and verbalized understanding. °

## 2020-04-05 NOTE — Telephone Encounter (Signed)
Error

## 2020-04-05 NOTE — Anesthesia Preprocedure Evaluation (Deleted)
Anesthesia Evaluation    Airway        Dental   Pulmonary Current Smoker,           Cardiovascular hypertension,      Neuro/Psych    GI/Hepatic   Endo/Other  diabetes  Renal/GU      Musculoskeletal   Abdominal   Peds  Hematology   Anesthesia Other Findings   Reproductive/Obstetrics                             Anesthesia Physical Anesthesia Plan  ASA:   Anesthesia Plan:    Post-op Pain Management:    Induction:   PONV Risk Score and Plan:   Airway Management Planned:   Additional Equipment:   Intra-op Plan:   Post-operative Plan:   Informed Consent:   Plan Discussed with:   Anesthesia Plan Comments: (PAT note written 04/05/2020 by Myra Gianotti, PA-C. )        Anesthesia Quick Evaluation

## 2020-04-05 NOTE — Telephone Encounter (Signed)
Patient called to report UTI diagnosed today - he will be taking a 10 day course of doxycycline. He is on the schedule for surgery Monday to have a femoral endarterectomy and iliac stenting. He has had a cath in for a month and was hospitalized in late November with uro-sepsis. Discussed with Dr. Oneida Alar, will cancel patient's surgery on Monday and put him down to follow up with [facility] in 3-4 weeks to follow up prior to rescheduling surgery. Patient aware.

## 2020-04-05 NOTE — Telephone Encounter (Signed)
[facility] called and said, pts catheter is very sedimented and odorous. Can she get a specimen and bring in today. He is having surgery monday

## 2020-04-06 ENCOUNTER — Other Ambulatory Visit (HOSPITAL_COMMUNITY): Payer: Medicare Other

## 2020-04-06 DIAGNOSIS — R338 Other retention of urine: Secondary | ICD-10-CM | POA: Diagnosis not present

## 2020-04-06 DIAGNOSIS — Z791 Long term (current) use of non-steroidal anti-inflammatories (NSAID): Secondary | ICD-10-CM | POA: Diagnosis not present

## 2020-04-06 DIAGNOSIS — F419 Anxiety disorder, unspecified: Secondary | ICD-10-CM | POA: Diagnosis not present

## 2020-04-06 DIAGNOSIS — E039 Hypothyroidism, unspecified: Secondary | ICD-10-CM | POA: Diagnosis not present

## 2020-04-06 DIAGNOSIS — Z7984 Long term (current) use of oral hypoglycemic drugs: Secondary | ICD-10-CM | POA: Diagnosis not present

## 2020-04-06 DIAGNOSIS — A4151 Sepsis due to Escherichia coli [E. coli]: Secondary | ICD-10-CM | POA: Diagnosis not present

## 2020-04-06 DIAGNOSIS — E1140 Type 2 diabetes mellitus with diabetic neuropathy, unspecified: Secondary | ICD-10-CM | POA: Diagnosis not present

## 2020-04-06 DIAGNOSIS — N3000 Acute cystitis without hematuria: Secondary | ICD-10-CM | POA: Diagnosis not present

## 2020-04-06 DIAGNOSIS — G4733 Obstructive sleep apnea (adult) (pediatric): Secondary | ICD-10-CM | POA: Diagnosis not present

## 2020-04-06 DIAGNOSIS — E1151 Type 2 diabetes mellitus with diabetic peripheral angiopathy without gangrene: Secondary | ICD-10-CM | POA: Diagnosis not present

## 2020-04-06 DIAGNOSIS — T83511A Infection and inflammatory reaction due to indwelling urethral catheter, initial encounter: Secondary | ICD-10-CM | POA: Diagnosis not present

## 2020-04-06 DIAGNOSIS — K5909 Other constipation: Secondary | ICD-10-CM | POA: Diagnosis not present

## 2020-04-06 DIAGNOSIS — M1990 Unspecified osteoarthritis, unspecified site: Secondary | ICD-10-CM | POA: Diagnosis not present

## 2020-04-06 DIAGNOSIS — I10 Essential (primary) hypertension: Secondary | ICD-10-CM | POA: Diagnosis not present

## 2020-04-06 DIAGNOSIS — Z87891 Personal history of nicotine dependence: Secondary | ICD-10-CM | POA: Diagnosis not present

## 2020-04-06 DIAGNOSIS — Z7982 Long term (current) use of aspirin: Secondary | ICD-10-CM | POA: Diagnosis not present

## 2020-04-06 DIAGNOSIS — F32A Depression, unspecified: Secondary | ICD-10-CM | POA: Diagnosis not present

## 2020-04-06 DIAGNOSIS — Z9981 Dependence on supplemental oxygen: Secondary | ICD-10-CM | POA: Diagnosis not present

## 2020-04-06 DIAGNOSIS — C3491 Malignant neoplasm of unspecified part of right bronchus or lung: Secondary | ICD-10-CM | POA: Diagnosis not present

## 2020-04-06 DIAGNOSIS — N401 Enlarged prostate with lower urinary tract symptoms: Secondary | ICD-10-CM | POA: Diagnosis not present

## 2020-04-06 DIAGNOSIS — H3530 Unspecified macular degeneration: Secondary | ICD-10-CM | POA: Diagnosis not present

## 2020-04-06 DIAGNOSIS — J449 Chronic obstructive pulmonary disease, unspecified: Secondary | ICD-10-CM | POA: Diagnosis not present

## 2020-04-09 ENCOUNTER — Inpatient Hospital Stay (HOSPITAL_COMMUNITY): Admission: RE | Admit: 2020-04-09 | Payer: Medicare Other | Source: Home / Self Care | Admitting: Vascular Surgery

## 2020-04-09 HISTORY — DX: Hypothyroidism, unspecified: E03.9

## 2020-04-09 HISTORY — DX: Benign prostatic hyperplasia without lower urinary tract symptoms: N40.0

## 2020-04-09 HISTORY — DX: Peripheral vascular disease, unspecified: I73.9

## 2020-04-09 SURGERY — ENDARTERECTOMY, FEMORAL
Anesthesia: General | Laterality: Right

## 2020-04-11 DIAGNOSIS — A4151 Sepsis due to Escherichia coli [E. coli]: Secondary | ICD-10-CM | POA: Diagnosis not present

## 2020-04-11 DIAGNOSIS — N3000 Acute cystitis without hematuria: Secondary | ICD-10-CM | POA: Diagnosis not present

## 2020-04-11 DIAGNOSIS — T83511A Infection and inflammatory reaction due to indwelling urethral catheter, initial encounter: Secondary | ICD-10-CM | POA: Diagnosis not present

## 2020-04-11 DIAGNOSIS — N401 Enlarged prostate with lower urinary tract symptoms: Secondary | ICD-10-CM | POA: Diagnosis not present

## 2020-04-11 DIAGNOSIS — R338 Other retention of urine: Secondary | ICD-10-CM | POA: Diagnosis not present

## 2020-04-11 DIAGNOSIS — E1151 Type 2 diabetes mellitus with diabetic peripheral angiopathy without gangrene: Secondary | ICD-10-CM | POA: Diagnosis not present

## 2020-04-12 LAB — URINE CULTURE

## 2020-04-16 ENCOUNTER — Telehealth: Payer: Self-pay | Admitting: *Deleted

## 2020-04-16 ENCOUNTER — Telehealth: Payer: Self-pay | Admitting: Student

## 2020-04-16 DIAGNOSIS — N401 Enlarged prostate with lower urinary tract symptoms: Secondary | ICD-10-CM | POA: Diagnosis not present

## 2020-04-16 DIAGNOSIS — A4151 Sepsis due to Escherichia coli [E. coli]: Secondary | ICD-10-CM | POA: Diagnosis not present

## 2020-04-16 DIAGNOSIS — T83511A Infection and inflammatory reaction due to indwelling urethral catheter, initial encounter: Secondary | ICD-10-CM | POA: Diagnosis not present

## 2020-04-16 DIAGNOSIS — N3000 Acute cystitis without hematuria: Secondary | ICD-10-CM | POA: Diagnosis not present

## 2020-04-16 DIAGNOSIS — E1151 Type 2 diabetes mellitus with diabetic peripheral angiopathy without gangrene: Secondary | ICD-10-CM | POA: Diagnosis not present

## 2020-04-16 DIAGNOSIS — R338 Other retention of urine: Secondary | ICD-10-CM | POA: Diagnosis not present

## 2020-04-16 NOTE — Telephone Encounter (Signed)
VM from [place] w/ [facility] Pt is not using his Oxygen anymore, has no SOB, would like DC order sent to [facility] Order placed on providers desk

## 2020-04-16 NOTE — Telephone Encounter (Signed)
According to office note from 11/2019, Toprol xl 12.5 mg qd was started with plans to increase to 25 mg daily if HR remained elevated.    BP today 148/78 which is what he has been running over past 60 days since home health has been seeing patient.      I will forward to B.Ahmed Prima ,PA-C for dispo

## 2020-04-16 NOTE — Telephone Encounter (Signed)
Tina with [facility] called stating that patient's HR continues to stay within 90 to 100. Wanting to know if his Metoprolol needs to be increased. Please call [phone].

## 2020-04-17 ENCOUNTER — Telehealth: Payer: Self-pay | Admitting: Cardiovascular Disease

## 2020-04-17 DIAGNOSIS — T83511A Infection and inflammatory reaction due to indwelling urethral catheter, initial encounter: Secondary | ICD-10-CM | POA: Diagnosis not present

## 2020-04-17 DIAGNOSIS — R338 Other retention of urine: Secondary | ICD-10-CM | POA: Diagnosis not present

## 2020-04-17 DIAGNOSIS — A4151 Sepsis due to Escherichia coli [E. coli]: Secondary | ICD-10-CM | POA: Diagnosis not present

## 2020-04-17 DIAGNOSIS — E1151 Type 2 diabetes mellitus with diabetic peripheral angiopathy without gangrene: Secondary | ICD-10-CM | POA: Diagnosis not present

## 2020-04-17 DIAGNOSIS — N3000 Acute cystitis without hematuria: Secondary | ICD-10-CM | POA: Diagnosis not present

## 2020-04-17 DIAGNOSIS — N401 Enlarged prostate with lower urinary tract symptoms: Secondary | ICD-10-CM | POA: Diagnosis not present

## 2020-04-17 MED ORDER — METOPROLOL SUCCINATE ER 25 MG PO TB24: 25.0000 mg | ORAL_TABLET | Freq: Every day | ORAL | 3 refills | Status: DC

## 2020-04-17 NOTE — Telephone Encounter (Signed)
Spoke with patient. Patient's procedure was cancelled on 1/3 due to two UTIs which required hospitalization and an indwelling catheter. Patient follows up with urology on Thursday and with Dr. Carlis Abbott to discuss rescheduling his procedure on 05/01/20. Patient will call back after he sees Dr. Carlis Abbott to reschedule his appointment with Dr. Gwenlyn Found. Appointment for 01/14 cancelled.

## 2020-04-17 NOTE — Telephone Encounter (Signed)
    Thank you for the update. Given his HR and BP remain elevated, would recommend increasing Toprol-XL to 25mg  daily.   Thanks,  [place]

## 2020-04-17 NOTE — Telephone Encounter (Signed)
New Message:     Pt was scheduled for surgery on 04-09-20, but it was cancelled. He wants to know if he still need to see Dr Gwenlyn Found on 04-20-20?

## 2020-04-17 NOTE — Telephone Encounter (Signed)
I left message for [name] giving instructions to increase John [name]'s Toprol XL to 25 mg daily.

## 2020-04-19 ENCOUNTER — Encounter: Payer: Self-pay | Admitting: Urology

## 2020-04-19 ENCOUNTER — Other Ambulatory Visit: Payer: Self-pay

## 2020-04-19 ENCOUNTER — Ambulatory Visit (INDEPENDENT_AMBULATORY_CARE_PROVIDER_SITE_OTHER): Payer: Medicare Other | Admitting: Urology

## 2020-04-19 VITALS — BP 148/75 | HR 115 | Temp 97.4°F | Ht 66.0 in | Wt 171.0 lb

## 2020-04-19 DIAGNOSIS — R339 Retention of urine, unspecified: Secondary | ICD-10-CM | POA: Diagnosis not present

## 2020-04-19 DIAGNOSIS — N138 Other obstructive and reflux uropathy: Secondary | ICD-10-CM | POA: Diagnosis not present

## 2020-04-19 DIAGNOSIS — N401 Enlarged prostate with lower urinary tract symptoms: Secondary | ICD-10-CM | POA: Diagnosis not present

## 2020-04-19 NOTE — Telephone Encounter (Signed)
Order faxed to [facility] [phone]

## 2020-04-19 NOTE — Patient Instructions (Signed)
Transurethral Resection of the Prostate Transurethral resection of the prostate (TURP) is the removal (resection) of part of the gland that produces semen (prostate gland). This procedure is done to treat benign prostatic hyperplasia (BPH). BPH is an abnormal, noncancerous (benign) increase in the number of cells that make up the prostate tissue. BPH causes the prostate to get bigger. The enlarged prostate can push against or block the tube that drains urine from the bladder out of the body (urethra). BPH can affect normal urine flow by causing bladder infections, difficulty controlling bladder function, and difficulty emptying the bladder.  The goal of TURP is to remove enough prostate tissue to allow for a normal flow of urine. The procedure will allow you to empty your bladder more completely when you urinate so that you can urinate less often. In a transurethral resection, a thin telescope with a light, a tiny camera, and an electric cutting edge (resectoscope) is passed through the urethra and into the prostate. The opening of the urethra is at the end of the penis. Tell a health care provider about:  Any allergies you have.  All medicines you are taking, including vitamins, herbs, eye drops, creams, and over-the-counter medicines.  Any problems you or family members have had with anesthetic medicines.  Any blood disorders you have.  Any surgeries you have had.  Any medical conditions you have.  Any prostate infections you have had. What are the risks? Generally, this is a safe procedure. However, problems may occur, including:  Infection.  Bleeding.  Allergic reactions to medicines.  Damage to other structures or organs, such as: ? The urethra. ? The bladder. ? Muscles that surround the prostate.  Difficulty getting an erection.  Inability to control when you urinate (incontinence).  Scarring, which may cause problems with urine flow. What happens before the  procedure? Medicines Ask your health care provider about:  Changing or stopping your regular medicines. This is especially important if you are taking diabetes medicines or blood thinners.  Taking medicines such as aspirin and ibuprofen. These medicines can thin your blood. Do not take these medicines unless your health care provider tells you to take them.  Taking over-the-counter medicines, vitamins, herbs, and supplements. Eating and drinking Follow instructions from your health care provider about eating and drinking, which may include:  8 hours before the procedure - stop eating heavy meals or foods, such as meat, fried foods, or fatty foods.  6 hours before the procedure - stop eating light meals or foods, such as toast or cereal.  6 hours before the procedure - stop drinking milk or drinks that contain milk.  2 hours before the procedure - stop drinking clear liquids. Staying hydrated Follow instructions from your health care provider about hydration, which may include:  Up to 2 hours before the procedure - you may continue to drink clear liquids, such as water, clear fruit juice, black coffee, and plain tea. General instructions  You may have a physical exam.  You may have a blood or urine sample taken.  Ask your health care provider what steps will be taken to help prevent infection. These may include: ? Washing skin with a germ-killing soap. ? Taking antibiotic medicine.  Plan to have someone take you home from the hospital or clinic. You may not be able to drive for up to 10 days after your procedure.  Plan to have a responsible adult care for you for at least 24 hours after you leave the hospital or  clinic. This is important. What happens during the procedure?  An IV will be inserted into one of your veins.  You will be given one or more of the following: ? A medicine to help you relax (sedative). ? A medicine to make you fall asleep (general anesthetic). ? A  medicine that is injected into your spine to numb the area below and slightly above the injection site (spinal anesthetic).  Your legs will be placed in foot rests (stirrups) so that your legs are apart and your knees are bent.  The resectoscope will be passed through your urethra to your prostate.  Parts of your prostate will be resected using the cutting edge of the resectoscope.  The resectoscope will be removed.  A small, thin tube (catheter) will be passed through your urethra and into your bladder. The catheter will drain urine into a bag outside of your body. ? Fluid may be passed through the catheter to keep the catheter open. The procedure may vary among health care providers and hospitals.   What happens after the procedure?  Your blood pressure, heart rate, breathing rate, and blood oxygen level will be monitored until you leave the hospital or clinic.  You may continue to receive fluids and medicines through an IV.  You may have some pain. Pain medicine will be available to help you.  You will have a catheter draining your urine. ? You may have blood in your urine. Your catheter may be kept in until your urine is clear. ? Your urinary drainage will be monitored. If necessary, your bladder may be rinsed out (irrigated) through your catheter.  You will be encouraged to walk around as soon as possible.  You may have to wear compression stockings. These stockings help prevent blood clots and reduce swelling in your legs.  Do not drive for 24 hours if you were given a sedative during your procedure. Summary  Transurethral resection of the prostate (TURP) is the removal (resection) of part of the gland that produces semen (prostate gland).  The goal of this procedure is to remove enough prostate tissue to allow for a normal flow of urine.  Follow instructions from your health care provider about taking medicines and about eating and drinking before the procedure. This  information is not intended to replace advice given to you by your health care provider. Make sure you discuss any questions you have with your health care provider. Document Revised: 07/14/2018 Document Reviewed: 12/23/2017 Elsevier Patient Education  2021 [employer].

## 2020-04-19 NOTE — Progress Notes (Signed)
Urological Symptom Review  Patient is experiencing the following symptoms: Trouble starting stream   Review of Systems  Gastrointestinal (upper)  : Negative for upper GI symptoms  Gastrointestinal (lower) : Negative for lower GI symptoms  Constitutional : Fatigue  Skin: Negative for skin symptoms  Eyes: Blurred vision  Ear/Nose/Throat : Negative for Ear/Nose/Throat symptoms  Hematologic/Lymphatic: Easy bruising  Cardiovascular : Leg swelling  Respiratory : Shortness of breath  Endocrine: Excessive thirst  Musculoskeletal: Joint pain  Neurological: Negative for neurological symptoms  Psychologic: Negative for psychiatric symptoms   Cath Change/ Replacement  Patient is present today for a catheter change due to urinary retention.  49ml of water was removed from the balloon, a 16FR foley cath was removed with out difficulty.  Patient was cleaned and prepped in a sterile fashion with betadine. A 16 FR foley cath was replaced into the bladder no complications were noted Urine return was noted 48ml and urine was yellow in color. The balloon was filled with 40ml of sterile water. A leg bag was attached for drainage. Patient was given proper instruction on catheter care.    Performed by: Jamariyah Johannsen, lpn  Follow up: Per MD note

## 2020-04-19 NOTE — Progress Notes (Signed)
04/19/2020 10:53 AM   John [name] 29-May-1945 [id number]  Referring provider: Sharion [name], [address] [place],  [place] [address]  Followup BPH  HPI: [name] is a 75yo here for followup for BPH with urinary retention. He has failed multiple voiding trials. He underwent UDS which showed a good bladder capcity. 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 He is very unhappy with the foley   PMH: Past Medical History:  Diagnosis Date  . Adenocarcinoma of lung, right ([facility]) 04/18/2016  . Anxiety   . Arthritis   . Asthma   . BPH (benign prostatic hyperplasia)    with urinary retention 02/06/20  . CHF (congestive heart failure) ([facility])   . COPD (chronic obstructive pulmonary disease) ([facility])   . Depression   . Diabetes mellitus without complication ([facility])    no meds  . DM (diabetes mellitus) ([facility]) 07/09/2016  . Dyspnea   . History of kidney stones   . Hyperlipidemia   . Hypertension   . Hypothyroidism   . Macular degeneration   . Neuropathy   . Non-small cell lung cancer, right ([name]) 04/18/2016  . Peripheral vascular disease ([facility])   . Prostatitis   . Pulmonary nodule, left 07/16/2016  . Sleep apnea    cpap    Surgical History: Past Surgical History:  Procedure Laterality Date  . ABDOMINAL AORTOGRAM W/LOWER EXTREMITY Left 02/06/2020   Procedure: ABDOMINAL AORTOGRAM W/LOWER EXTREMITY;  Surgeon: Lorretta Harp, MD;  Location: [facility];  Service: Cardiovascular;  Laterality: Left;  . CATARACT EXTRACTION, BILATERAL Bilateral   . NO PAST SURGERIES    . PORTACATH PLACEMENT Left 06/13/2016   Procedure: INSERTION PORT-A-CATH;  Surgeon: Aviva Signs, MD;  Location: [facility];  Service: General;  Laterality: Left;  Marland Kitchen VIDEO BRONCHOSCOPY WITH ENDOBRONCHIAL NAVIGATION N/A 05/28/2016   Procedure: VIDEO BRONCHOSCOPY WITH ENDOBRONCHIAL NAVIGATION;  Surgeon: [name], MD;  Location: [facility];  Service: Thoracic;  Laterality: N/A;  . VIDEO BRONCHOSCOPY WITH ENDOBRONCHIAL  ULTRASOUND N/A 05/28/2016   Procedure: VIDEO BRONCHOSCOPY WITH ENDOBRONCHIAL ULTRASOUND;  Surgeon: [name], MD;  Location: [facility];  Service: Thoracic;  Laterality: N/A;    Home Medications:  Allergies as of 04/19/2020      Reactions   Jardiance [empagliflozin]    FEELS SLUGGISH, TIRED   Lopressor [metoprolol]    Fatigue      Medication List       Accurate as of April 19, 2020 10:53 AM. If you have any questions, ask your nurse or doctor.        albuterol 108 (90 Base) MCG/ACT inhaler Commonly known as: VENTOLIN HFA Inhale 2 puffs into the lungs every 4 (four) hours as needed for wheezing or shortness of breath.   amLODipine 5 MG tablet Commonly known as: NORVASC Take 1 tablet (5 mg total) by mouth daily.   aspirin EC 81 MG tablet Take 81 mg by mouth daily.   cloNIDine 0.1 MG tablet Commonly known as: CATAPRES TAKE 1 TABLET (0.1 MG TOTAL) BY MOUTH 3 (THREE) TIMES DAILY.   doxycycline 100 MG tablet Commonly known as: VIBRA-TABS Take 1 tablet (100 mg total) by mouth 2 (two) times daily. 1 po bid   dutasteride 0.5 MG capsule Commonly known as: AVODART TAKE 1 CAPSULE BY MOUTH EVERY DAY What changed: how much to take   furosemide 20 MG tablet Commonly known as: LASIX Take 1 tablet (20 mg total) by mouth 2 (two) times daily. What changed: when to take this  gabapentin 300 MG capsule Commonly known as: NEURONTIN Take 1 capsule (300 mg total) by mouth 3 (three) times daily. What changed:   how much to take  when to take this  additional instructions   levothyroxine 75 MCG tablet Commonly known as: SYNTHROID Take 1 tablet (75 mcg total) by mouth daily.   linaclotide 72 MCG capsule Commonly known as: Linzess Take 1 capsule (72 mcg total) by mouth daily before breakfast. What changed:   when to take this  reasons to take this   meloxicam 7.5 MG tablet Commonly known as: MOBIC Take 1 tablet (7.5 mg total) by mouth daily as needed for pain  (back pain).   metFORMIN 500 MG 24 hr tablet Commonly known as: GLUCOPHAGE-XR Take 1 tablet (500 mg total) by mouth daily with breakfast.   metoprolol succinate 25 MG 24 hr tablet Commonly known as: Toprol XL Take 1 tablet (25 mg total) by mouth daily.   mupirocin ointment 2 % Commonly known as: BACTROBAN Apply 1 application topically 3 (three) times daily as needed (wound care.).   olmesartan 40 MG tablet Commonly known as: Benicar Take 1 tablet (40 mg total) by mouth daily.   ondansetron 4 MG disintegrating tablet Commonly known as: Zofran ODT Take 1 tablet (4 mg total) by mouth every 8 (eight) hours as needed for nausea or vomiting.   OneTouch Delica Lancets 16X Misc Use to test blood sugars daily as directed. DX: E11.9   OneTouch Verio Reflect w/Device Kit Use to test blood sugars daily as directed. DX: E11.9   OneTouch Verio test strip Generic drug: glucose blood Use to test blood sugars daily as directed. DX: E11.9   pravastatin 40 MG tablet Commonly known as: PRAVACHOL Take 1 tablet (40 mg total) by mouth every Monday, Wednesday, and Friday. At night   Rybelsus 7 MG Tabs Generic drug: Semaglutide Take 7 mg by mouth daily.   tamsulosin 0.4 MG Caps capsule Commonly known as: FLOMAX Take 2 capsules (0.8 mg total) by mouth daily after supper.   Trelegy Ellipta 100-62.5-25 MCG/INH Aepb Generic drug: Fluticasone-Umeclidin-Vilant Inhale 1 puff into the lungs daily.       Allergies:  Allergies  Allergen Reactions  . Jardiance [Empagliflozin]     FEELS SLUGGISH, TIRED  . Lopressor [Metoprolol]     Fatigue    Family History: Family History  Problem Relation Age of Onset  . Hypertension Mother   . Diabetes Father   . Heart disease Father   . Stroke Father   . Hypertension Sister     Social History:  reports that he has been smoking cigarettes. He started smoking about 59 years ago. He has a 27.50 pack-year smoking history. He has never used smokeless  tobacco. He reports that he does not drink alcohol and does not use drugs.  ROS: All other review of systems were reviewed and are negative except what is noted above in HPI  Physical Exam: BP (!) 148/75   Pulse (!) 115   Temp (!) 97.4 F (36.3 C)   Ht 5' 6"  (1.676 m)   Wt 171 lb (77.6 kg)   BMI 27.60 kg/m   Constitutional:  Alert and oriented, No acute distress. HEENT: [place] AT, moist mucus membranes.  Trachea midline, no masses. Cardiovascular: No clubbing, cyanosis, or edema. Respiratory: Normal respiratory effort, no increased work of breathing. GI: Abdomen is soft, nontender, nondistended, no abdominal masses GU: No CVA tenderness.  Lymph: No cervical or inguinal lymphadenopathy. Skin: No rashes, bruises  or suspicious lesions. Neurologic: Grossly intact, no focal deficits, moving all 4 extremities. Psychiatric: Normal mood and affect.  Laboratory Data: Lab Results  Component Value Date   WBC 6.8 03/23/2020   HGB 13.7 03/23/2020   HCT 43.5 03/23/2020   MCV 95.6 03/23/2020   PLT 288 03/23/2020    Lab Results  Component Value Date   CREATININE 0.85 03/23/2020    No results found for: PSA  No results found for: TESTOSTERONE  Lab Results  Component Value Date   HGBA1C 6.8 03/20/2020    Urinalysis    Component Value Date/Time   COLORURINE YELLOW 03/03/2020 0510   APPEARANCEUR Cloudy (A) 04/05/2020 1000   LABSPEC 1.021 03/03/2020 0510   PHURINE 5.0 03/03/2020 0510   GLUCOSEU Negative 04/05/2020 1000   HGBUR LARGE (A) 03/03/2020 0510   BILIRUBINUR Negative 04/05/2020 1000   KETONESUR NEGATIVE 03/03/2020 0510   PROTEINUR 1+ (A) 04/05/2020 1000   PROTEINUR 100 (A) 03/03/2020 0510   NITRITE Positive (A) 04/05/2020 1000   NITRITE NEGATIVE 03/03/2020 0510   LEUKOCYTESUR 2+ (A) 04/05/2020 1000   LEUKOCYTESUR LARGE (A) 03/03/2020 0510    Lab Results  Component Value Date   LABMICR See below: 04/05/2020   WBCUA 11-30 (A) 04/05/2020   LABEPIT None seen  04/05/2020   BACTERIA Many (A) 04/05/2020    Pertinent Imaging:  Results for orders placed in visit on 12/30/19  DG Abd 1 View  Narrative CLINICAL DATA:  Abdominal pain  EXAM: ABDOMEN - 1 VIEW  COMPARISON:  06/05/2017  FINDINGS: Scattered large and small bowel gas is noted. Mild retained fecal material is seen without obstructive change. Degenerative change of the lumbar spine is noted. No abnormal calcifications are seen.  IMPRESSION: Mild retained fecal material.   Electronically Signed By: Inez Catalina M.D. On: 01/01/2020 15:15  Results for orders placed during the hospital encounter of 08/28/16  US Venous Img Lower Bilateral  Narrative CLINICAL DATA:  Bilateral leg swelling.  Treatment for lung cancer.  EXAM: BILATERAL LOWER EXTREMITY VENOUS DOPPLER ULTRASOUND  TECHNIQUE: Gray-scale sonography with graded compression, as well as color Doppler and duplex ultrasound were performed to evaluate the lower extremity deep venous systems from the level of the common femoral vein and including the common femoral, femoral, profunda femoral, popliteal and calf veins including the posterior tibial, peroneal and gastrocnemius veins when visible. The superficial great saphenous vein was also interrogated. Spectral Doppler was utilized to evaluate flow at rest and with distal augmentation maneuvers in the common femoral, femoral and popliteal veins.  COMPARISON:  None.  FINDINGS: RIGHT LOWER EXTREMITY  Common Femoral Vein: No evidence of thrombus. Normal compressibility, respiratory phasicity and response to augmentation.  Saphenofemoral Junction: No evidence of thrombus. Normal compressibility and flow on color Doppler imaging.  Profunda Femoral Vein: No evidence of thrombus. Normal compressibility and flow on color Doppler imaging.  Femoral Vein: No evidence of thrombus. Normal compressibility, respiratory phasicity and response to augmentation.  Popliteal  Vein: No evidence of thrombus. Normal compressibility, respiratory phasicity and response to augmentation.  Calf Veins: No evidence of thrombus. Normal compressibility and flow on color Doppler imaging.  Superficial Great Saphenous Vein: No evidence of thrombus. Normal compressibility and flow on color Doppler imaging.  Venous Reflux:  None.  Other Findings:  None.  LEFT LOWER EXTREMITY  Common Femoral Vein: No evidence of thrombus. Normal compressibility, respiratory phasicity and response to augmentation.  Saphenofemoral Junction: No evidence of thrombus. Normal compressibility and flow on color Doppler imaging.  Profunda Femoral Vein: No evidence of thrombus. Normal compressibility and flow on color Doppler imaging.  Femoral Vein: No evidence of thrombus. Normal compressibility, respiratory phasicity and response to augmentation.  Popliteal Vein: No evidence of thrombus. Normal compressibility, respiratory phasicity and response to augmentation.  Calf Veins: No evidence of thrombus. Normal compressibility and flow on color Doppler imaging.  Superficial Great Saphenous Vein: No evidence of thrombus. Normal compressibility and flow on color Doppler imaging.  Venous Reflux:  None.  Other Findings:  None.  IMPRESSION: No evidence of DVT within either lower extremity.   Electronically Signed By: Franchot Gallo M.D. On: 08/28/2016 14:17  No results found for this or any previous visit.  No results found for this or any previous visit.  No results found for this or any previous visit.  No results found for this or any previous visit.  No results found for this or any previous visit.  No results found for this or any previous visit.   Assessment & Plan:    1. BPH with urinary obstruction -We discussed the management of his BPH including continued medical therapy, Rezum, Urolift, TURP and simple prostatectomy. After discussing the options the patient has  elected to proceed with TURP. Risks/benefits/alternatives discussed.   2. Urinary retention -Schedule for TURP   No follow-ups on file.  Nicolette Bang, MD  [facility] [place]

## 2020-04-20 ENCOUNTER — Ambulatory Visit: Payer: Medicare Other | Admitting: Cardiovascular Disease

## 2020-04-26 DIAGNOSIS — R338 Other retention of urine: Secondary | ICD-10-CM | POA: Diagnosis not present

## 2020-04-26 DIAGNOSIS — N401 Enlarged prostate with lower urinary tract symptoms: Secondary | ICD-10-CM | POA: Diagnosis not present

## 2020-04-26 DIAGNOSIS — T83511A Infection and inflammatory reaction due to indwelling urethral catheter, initial encounter: Secondary | ICD-10-CM | POA: Diagnosis not present

## 2020-04-26 DIAGNOSIS — E1151 Type 2 diabetes mellitus with diabetic peripheral angiopathy without gangrene: Secondary | ICD-10-CM | POA: Diagnosis not present

## 2020-04-26 DIAGNOSIS — A4151 Sepsis due to Escherichia coli [E. coli]: Secondary | ICD-10-CM | POA: Diagnosis not present

## 2020-04-26 DIAGNOSIS — N3000 Acute cystitis without hematuria: Secondary | ICD-10-CM | POA: Diagnosis not present

## 2020-05-01 ENCOUNTER — Other Ambulatory Visit: Payer: Self-pay

## 2020-05-01 ENCOUNTER — Encounter: Payer: Self-pay | Admitting: Vascular Surgery

## 2020-05-01 ENCOUNTER — Ambulatory Visit (INDEPENDENT_AMBULATORY_CARE_PROVIDER_SITE_OTHER): Payer: Medicare Other | Admitting: Vascular Surgery

## 2020-05-01 VITALS — BP 146/73 | HR 98 | Temp 98.0°F | Resp 16 | Ht 66.0 in | Wt 171.0 lb

## 2020-05-01 DIAGNOSIS — I739 Peripheral vascular disease, unspecified: Secondary | ICD-10-CM | POA: Diagnosis not present

## 2020-05-01 NOTE — Progress Notes (Signed)
Patient name: John [name] MRN: [id number] DOB: January 31, 1946 Sex: male  REASON FOR CONSULT: Follow-up to discuss right femoral endarterectomy and possible right iliac stent  HPI: John [name] is a 75 y.o. male, with COPD, HTN, HLD, DM, PAD who initially presented as a referral from Dr. Gwenlyn Found for evaluation of right femoral endarterectomy.  Ultimately patient endorsed several years of claudication in the right lower extremity.  He got severe burning in the right thigh after only walking about 20 to 25 feet.  He recently underwent arteriogram with Dr. Gwenlyn Found on 02/06/2020 that showed a 95% right common iliac stenosis as well as a 90% right common femoral stenosis and a high-grade profunda stenosis and also high grade right mid SFA stenosis   He was scheduled for a right femoral endarterectomy and possible retrograde iliac stent early this month with me but his surgery was canceled after he was admitted with UTI sepsis.  He still has chronic indwelling Foley catheter now and is now scheduled for TURP on February 24.  He states his right leg is about the same not having any rest pain symptoms or wounds.  He would like to get his prostate surgery done first and get his catheter removed prior to getting his surgery with [place].  Past Medical History:  Diagnosis Date  . Adenocarcinoma of lung, right ([place]) 04/18/2016  . Anxiety   . Arthritis   . Asthma   . BPH (benign prostatic hyperplasia)    with urinary retention 02/06/20  . CHF (congestive heart failure) ([place])   . COPD (chronic obstructive pulmonary disease) ([place])   . Depression   . Diabetes mellitus without complication (HCC)    no meds  . DM (diabetes mellitus) ([place]) 07/09/2016  . Dyspnea   . History of kidney stones   . Hyperlipidemia   . Hypertension   . Hypothyroidism   . Macular degeneration   . Neuropathy   . Non-small cell lung cancer, right ([place]) 04/18/2016  . Peripheral vascular disease ([place])   . Prostatitis   . Pulmonary nodule,  left 07/16/2016  . Sleep apnea    cpap    Past Surgical History:  Procedure Laterality Date  . ABDOMINAL AORTOGRAM W/LOWER EXTREMITY Left 02/06/2020   Procedure: ABDOMINAL AORTOGRAM W/LOWER EXTREMITY;  Surgeon: Lorretta Harp, MD;  Location: [facility];  Service: Cardiovascular;  Laterality: Left;  . CATARACT EXTRACTION, BILATERAL Bilateral   . NO PAST SURGERIES    . PORTACATH PLACEMENT Left 06/13/2016   Procedure: INSERTION PORT-A-CATH;  Surgeon: Aviva Signs, MD;  Location: [facility];  Service: General;  Laterality: Left;  Marland Kitchen VIDEO BRONCHOSCOPY WITH ENDOBRONCHIAL NAVIGATION N/A 05/28/2016   Procedure: VIDEO BRONCHOSCOPY WITH ENDOBRONCHIAL NAVIGATION;  Surgeon: [name], MD;  Location: [facility];  Service: Thoracic;  Laterality: N/A;  . VIDEO BRONCHOSCOPY WITH ENDOBRONCHIAL ULTRASOUND N/A 05/28/2016   Procedure: VIDEO BRONCHOSCOPY WITH ENDOBRONCHIAL ULTRASOUND;  Surgeon: [name], MD;  Location: [facility];  Service: Thoracic;  Laterality: N/A;    Family History  Problem Relation Age of Onset  . Hypertension Mother   . Diabetes Father   . Heart disease Father   . Stroke Father   . Hypertension Sister     SOCIAL HISTORY: Social History   Socioeconomic History  . Marital status: Divorced    Spouse name: Not on file  . Number of children: 5  . Years of education: 10  . Highest education level: 10th grade  Occupational History  . Occupation:  Retired  Tobacco Use  . Smoking status: Current Every Day Smoker    Packs/day: 0.50    Years: 55.00    Pack years: 27.50    Types: Cigarettes    Start date: 03/11/1961  . Smokeless tobacco: Never Used  . Tobacco comment: 1/2 pack to 1 pack per day 12/14/19  Vaping Use  . Vaping Use: Never used  Substance and Sexual Activity  . Alcohol use: No  . Drug use: No  . Sexual activity: Yes    Birth control/protection: None  Other Topics Concern  . Not on file  Social History Narrative  . Not on file   Social  Determinants of Health   Financial Resource Strain: Low Risk   . Difficulty of Paying Living Expenses: Not hard at all  Food Insecurity: No Food Insecurity  . Worried About Charity fundraiser in the Last Year: Never true  . Ran Out of Food in the Last Year: Never true  Transportation Needs: No Transportation Needs  . Lack of Transportation (Medical): No  . Lack of Transportation (Non-Medical): No  Physical Activity: Inactive  . Days of Exercise per Week: 0 days  . Minutes of Exercise per Session: 0 min  Stress: No Stress Concern Present  . Feeling of Stress : Not at all  Social Connections: Moderately Isolated  . Frequency of Communication with Friends and Family: More than three times a week  . Frequency of Social Gatherings with Friends and Family: More than three times a week  . Attends Religious Services: Never  . Active Member of Clubs or Organizations: No  . Attends Archivist Meetings: Never  . Marital Status: Living with partner  Intimate Partner Violence: Not At Risk  . Fear of Current or Ex-Partner: No  . Emotionally Abused: No  . Physically Abused: No  . Sexually Abused: No    Allergies  Allergen Reactions  . Jardiance [Empagliflozin]     FEELS SLUGGISH, TIRED  . Lopressor [Metoprolol]     Fatigue    Current Outpatient Medications  Medication Sig Dispense Refill  . amLODipine (NORVASC) 5 MG tablet Take 1 tablet (5 mg total) by mouth daily. 90 tablet 3  . aspirin EC 81 MG tablet Take 81 mg by mouth daily.    . Blood Glucose Monitoring Suppl (ONETOUCH VERIO REFLECT) w/Device KIT Use to test blood sugars daily as directed. DX: E11.9 1 kit 1  . cloNIDine (CATAPRES) 0.1 MG tablet TAKE 1 TABLET (0.1 MG TOTAL) BY MOUTH 3 (THREE) TIMES DAILY. 270 tablet 1  . doxycycline (VIBRA-TABS) 100 MG tablet Take 1 tablet (100 mg total) by mouth 2 (two) times daily. 1 po bid 20 tablet 0  . dutasteride (AVODART) 0.5 MG capsule TAKE 1 CAPSULE BY MOUTH EVERY DAY (Patient  taking differently: Take 0.5 mg by mouth daily.) 90 capsule 1  . furosemide (LASIX) 20 MG tablet Take 1 tablet (20 mg total) by mouth 2 (two) times daily. (Patient taking differently: Take 20 mg by mouth daily.) 180 tablet 1  . gabapentin (NEURONTIN) 300 MG capsule Take 1 capsule (300 mg total) by mouth 3 (three) times daily. (Patient taking differently: Take 300-600 mg by mouth See admin instructions. Take 1 capsule (300 mg) by mouth in the morning & take 2 capsules (600 mg) by mouth in the evening.) 270 capsule 2  . glucose blood (ONETOUCH VERIO) test strip Use to test blood sugars daily as directed. DX: E11.9 100 each 12  . levothyroxine (  SYNTHROID) 75 MCG tablet Take 1 tablet (75 mcg total) by mouth daily. 90 tablet 3  . linaclotide (LINZESS) 72 MCG capsule Take 1 capsule (72 mcg total) by mouth daily before breakfast. (Patient taking differently: Take 72 mcg by mouth daily as needed (constipation.).) 30 capsule 2  . meloxicam (MOBIC) 7.5 MG tablet Take 1 tablet (7.5 mg total) by mouth daily as needed for pain (back pain). 90 tablet 0  . metFORMIN (GLUCOPHAGE-XR) 500 MG 24 hr tablet Take 1 tablet (500 mg total) by mouth daily with breakfast. 90 tablet 1  . metoprolol succinate (TOPROL XL) 25 MG 24 hr tablet Take 1 tablet (25 mg total) by mouth daily. 90 tablet 3  . mupirocin ointment (BACTROBAN) 2 % Apply 1 application topically 3 (three) times daily as needed (wound care.).    Marland Kitchen olmesartan (BENICAR) 40 MG tablet Take 1 tablet (40 mg total) by mouth daily. 30 tablet 11  . ondansetron (ZOFRAN ODT) 4 MG disintegrating tablet Take 1 tablet (4 mg total) by mouth every 8 (eight) hours as needed for nausea or vomiting. 20 tablet 0  . OneTouch Delica Lancets 62V MISC Use to test blood sugars daily as directed. DX: E11.9 100 each 1  . pravastatin (PRAVACHOL) 40 MG tablet Take 1 tablet (40 mg total) by mouth every Monday, Wednesday, and Friday. At night 90 tablet 1  . Semaglutide (RYBELSUS) 7 MG TABS Take 7  mg by mouth daily. 90 tablet 1  . tamsulosin (FLOMAX) 0.4 MG CAPS capsule Take 2 capsules (0.8 mg total) by mouth daily after supper. 120 capsule 1  . TRELEGY ELLIPTA 100-62.5-25 MCG/INH AEPB Inhale 1 puff into the lungs daily. 1 each 3  . albuterol (VENTOLIN HFA) 108 (90 Base) MCG/ACT inhaler Inhale 2 puffs into the lungs every 4 (four) hours as needed for wheezing or shortness of breath. (Patient not taking: No sig reported) 8 g 6   No current facility-administered medications for this visit.    REVIEW OF SYSTEMS:  [X]  denotes positive finding, [ ]  denotes negative finding Cardiac  Comments:  Chest pain or chest pressure:    Shortness of breath upon exertion:    Short of breath when lying flat:    Irregular heart rhythm:        Vascular    Pain in calf, thigh, or hip brought on by ambulation:    Pain in feet at night that wakes you up from your sleep:     Blood clot in your veins:    Leg swelling:         Pulmonary    Oxygen at home:    Productive cough:     Wheezing:         Neurologic    Sudden weakness in arms or legs:     Sudden numbness in arms or legs:     Sudden onset of difficulty speaking or slurred speech:    Temporary loss of vision in one eye:     Problems with dizziness:         Gastrointestinal    Blood in stool:     Vomited blood:         Genitourinary    Burning when urinating:     Blood in urine:        Psychiatric    Major depression:         Hematologic    Bleeding problems:    Problems with blood clotting too easily:  Skin    Rashes or ulcers:        Constitutional    Fever or chills:      PHYSICAL EXAM: Vitals:   05/01/20 1451  BP: (!) 146/73  Pulse: 98  Resp: 16  Temp: 98 F (36.7 C)  TempSrc: Temporal  SpO2: 94%  Weight: 171 lb (77.6 kg)  Height: 5' 6"  (1.676 m)    GENERAL: The patient is a well-nourished male, in no acute distress. The vital signs are documented above. CARDIAC: There is a regular rate and rhythm.   VASCULAR:  Left femoral pulse palpable Right femoral pulse weak but palpable PULMONARY: No respiratory distress ABDOMEN: Soft and non-tender. MUSCULOSKELETAL: There are no major deformities or cyanosis. NEUROLOGIC: No focal weakness or paresthesias are detected. Urology: Chronic foley   DATA:   Previously reviewed aortogram that shows right common iliac artery calcified high grade stenosis as well as right common femoral calcified high grade stenosis and proximal profunda stenosis and right SFA high grade stenosis.  Assessment/Plan:  75 year old male presents for further follow-up to discuss right femoral endarterectomy and possible retrograde right common iliac stent in the setting of short distance lifestyle limiting claudication.  His surgery was scheduled earlier this month but was canceled after he was admitted with UTI sepsis.  He has now had two hospital admissions for urinary infections in the last several months.  He is scheduled for TURP with urology on 05/31/2020 and still has an indwelling Foley catheter which he expects will stay for one week after his TURP.  I agree with Mr. Schubert that his prostate surgery is the most pressing issue and getting his foley catheter removed.  We agreed on delaying his femoral endarterectomy and possible retrograde iliac stent until after his prostate surgery.  I gave him the contact information to our office and he will call to schedule surgery once he has completed his prostate surgery pending on how he feels in the recovery phase.    Marty Heck, MD [facility] Office: [phone]

## 2020-05-02 DIAGNOSIS — T83511A Infection and inflammatory reaction due to indwelling urethral catheter, initial encounter: Secondary | ICD-10-CM | POA: Diagnosis not present

## 2020-05-02 DIAGNOSIS — N3000 Acute cystitis without hematuria: Secondary | ICD-10-CM | POA: Diagnosis not present

## 2020-05-02 DIAGNOSIS — E1151 Type 2 diabetes mellitus with diabetic peripheral angiopathy without gangrene: Secondary | ICD-10-CM | POA: Diagnosis not present

## 2020-05-02 DIAGNOSIS — R338 Other retention of urine: Secondary | ICD-10-CM | POA: Diagnosis not present

## 2020-05-02 DIAGNOSIS — N401 Enlarged prostate with lower urinary tract symptoms: Secondary | ICD-10-CM | POA: Diagnosis not present

## 2020-05-02 DIAGNOSIS — A4151 Sepsis due to Escherichia coli [E. coli]: Secondary | ICD-10-CM | POA: Diagnosis not present

## 2020-05-03 ENCOUNTER — Ambulatory Visit (INDEPENDENT_AMBULATORY_CARE_PROVIDER_SITE_OTHER): Payer: Medicare Other | Admitting: Family Medicine

## 2020-05-03 DIAGNOSIS — J069 Acute upper respiratory infection, unspecified: Secondary | ICD-10-CM | POA: Diagnosis not present

## 2020-05-03 MED ORDER — AZITHROMYCIN 250 MG PO TABS: ORAL_TABLET | ORAL | 0 refills | Status: DC

## 2020-05-03 MED ORDER — PSEUDOEPHEDRINE-GUAIFENESIN ER 60-600 MG PO TB12: 1.0000 | ORAL_TABLET | Freq: Two times a day (BID) | ORAL | 0 refills | Status: DC

## 2020-05-03 NOTE — Progress Notes (Signed)
Subjective:    Patient ID: John [name], male    DOB: 12/31/1945, 75 y.o.   MRN: [id number]   HPI: John [name] is a 75 y.o. male presenting for been sick with head and chest cold. Congested. Mild dry cough. No recent CoVID test. O2 level 96-98 usually. Was 39 at last check by home health. Can walk 75 feet without dyspnea.Onset a week ago.   Depression screen [facility] 2/9 03/20/2020 03/20/2020 12/16/2019 05/25/2019 05/25/2019  Decreased Interest 0 0 0 0 0  Down, Depressed, Hopeless 0 0 0 0 0  PHQ - 2 Score 0 0 0 0 0  Altered sleeping - - - - -  Tired, decreased energy - - - - -  Change in appetite - - - - -  Feeling bad or failure about yourself  - - - - -  Trouble concentrating - - - - -  Moving slowly or fidgety/restless - - - - -  Suicidal thoughts - - - - -  PHQ-9 Score - - - - -  Difficult doing work/chores - - - - -  Some recent data might be hidden     Relevant past medical, surgical, family and social history reviewed and updated as indicated.  Interim medical history since our last visit reviewed. Allergies and medications reviewed and updated.  ROS:  Review of Systems  Constitutional: Negative for chills and fever.  HENT: Positive for congestion. Negative for sore throat.   Respiratory: Positive for cough. Negative for shortness of breath and wheezing.   Cardiovascular: Negative for chest pain.  Skin: Negative for color change and rash.  Neurological: Negative for dizziness and syncope.     Social History   Tobacco Use  Smoking Status Current Every Day Smoker  . Packs/day: 0.50  . Years: 55.00  . Pack years: 27.50  . Types: Cigarettes  . Start date: 03/11/1961  Smokeless Tobacco Never Used  Tobacco Comment   1/2 pack to 1 pack per day 12/14/19       Objective:     Wt Readings from Last 3 Encounters:  05/05/20 171 lb 1.2 oz (77.6 kg)  05/01/20 171 lb (77.6 kg)  04/19/20 171 lb (77.6 kg)     Exam deferred. Pt. Harboring due to COVID 19. Phone visit  performed.   Assessment & Plan:   1. Viral upper respiratory tract infection     Meds ordered this encounter  Medications  . azithromycin (ZITHROMAX Z-PAK) 250 MG tablet    Sig: Take two right away Then one a day for the next 4 days.    Dispense:  6 each    Refill:  0  . pseudoephedrine-guaifenesin (MUCINEX D) 60-600 MG 12 hr tablet    Sig: Take 1 tablet by mouth every 12 (twelve) hours for 10 days. As needed for congestion    Dispense:  20 tablet    Refill:  0    Orders Placed This Encounter  Procedures  . Novel Coronavirus, NAA ([facility])    Order Specific Question:   Is this test for diagnosis or screening    Answer:   Diagnosis of ill patient    Order Specific Question:   Symptomatic for COVID-19 as defined by CDC    Answer:   Yes    Order Specific Question:   Date of Symptom Onset    Answer:   04/29/2020    Order Specific Question:   Hospitalized for COVID-19    Answer:   No  Order Specific Question:   Admitted to ICU for COVID-19    Answer:   No    Order Specific Question:   Previously tested for COVID-19    Answer:   Yes    Order Specific Question:   Resident in a congregate (group) care setting    Answer:   No    Order Specific Question:   Is the patient student?    Answer:   No    Order Specific Question:   Employed in healthcare setting    Answer:   No    Order Specific Question:   Has patient completed COVID vaccination(s) (2 doses of Pfizer/Moderna 1 dose of [employer])    Answer:   Unknown  . SARS-COV-2, NAA 2 DAY TAT      Diagnoses and all orders for this visit:  Viral upper respiratory tract infection -     Novel Coronavirus, NAA ([facility])  Other orders -     azithromycin (ZITHROMAX Z-PAK) 250 MG tablet; Take two right away Then one a day for the next 4 days. -     pseudoephedrine-guaifenesin (MUCINEX D) 60-600 MG 12 hr tablet; Take 1 tablet by mouth every 12 (twelve) hours for 10 days. As needed for congestion -     SARS-COV-2, NAA 2 DAY  TAT    Virtual Visit via telephone Note  I discussed the limitations, risks, security and privacy concerns of performing an evaluation and management service by telephone and the availability of in person appointments. The patient was identified with two identifiers. Pt.expressed understanding and agreed to proceed. Pt. Is at home. Dr. Livia Snellen is in his office.  Follow Up Instructions:   I discussed the assessment and treatment plan with the patient. The patient was provided an opportunity to ask questions and all were answered. The patient agreed with the plan and demonstrated an understanding of the instructions.   The patient was advised to call back or seek an in-person evaluation if the symptoms worsen or if the condition fails to improve as anticipated.   Total minutes including chart review and phone contact time: 11   Follow up plan: Return if symptoms worsen or fail to improve.  Claretta Fraise, MD [place]

## 2020-05-04 LAB — NOVEL CORONAVIRUS, NAA: SARS-CoV-2, NAA: NOT DETECTED

## 2020-05-04 LAB — SARS-COV-2, NAA 2 DAY TAT

## 2020-05-05 ENCOUNTER — Emergency Department (HOSPITAL_COMMUNITY): Payer: Medicare Other

## 2020-05-05 ENCOUNTER — Encounter (HOSPITAL_COMMUNITY): Payer: Self-pay | Admitting: Emergency Medicine

## 2020-05-05 ENCOUNTER — Inpatient Hospital Stay (HOSPITAL_COMMUNITY)
Admission: EM | Admit: 2020-05-05 | Discharge: 2020-05-08 | DRG: 698 | Disposition: A | Discharge status: Home Health | Payer: Medicare Other | Attending: Internal Medicine | Admitting: Internal Medicine

## 2020-05-05 ENCOUNTER — Other Ambulatory Visit: Payer: Self-pay

## 2020-05-05 DIAGNOSIS — N401 Enlarged prostate with lower urinary tract symptoms: Secondary | ICD-10-CM | POA: Diagnosis present

## 2020-05-05 DIAGNOSIS — R9431 Abnormal electrocardiogram [ECG] [EKG]: Secondary | ICD-10-CM

## 2020-05-05 DIAGNOSIS — J9621 Acute and chronic respiratory failure with hypoxia: Secondary | ICD-10-CM | POA: Diagnosis present

## 2020-05-05 DIAGNOSIS — E872 Acidosis, unspecified: Secondary | ICD-10-CM

## 2020-05-05 DIAGNOSIS — E1151 Type 2 diabetes mellitus with diabetic peripheral angiopathy without gangrene: Secondary | ICD-10-CM | POA: Diagnosis present

## 2020-05-05 DIAGNOSIS — J40 Bronchitis, not specified as acute or chronic: Secondary | ICD-10-CM

## 2020-05-05 DIAGNOSIS — I10 Essential (primary) hypertension: Secondary | ICD-10-CM | POA: Diagnosis not present

## 2020-05-05 DIAGNOSIS — N390 Urinary tract infection, site not specified: Secondary | ICD-10-CM | POA: Diagnosis not present

## 2020-05-05 DIAGNOSIS — A4151 Sepsis due to Escherichia coli [E. coli]: Secondary | ICD-10-CM

## 2020-05-05 DIAGNOSIS — E876 Hypokalemia: Secondary | ICD-10-CM | POA: Diagnosis present

## 2020-05-05 DIAGNOSIS — Z7982 Long term (current) use of aspirin: Secondary | ICD-10-CM

## 2020-05-05 DIAGNOSIS — Z85118 Personal history of other malignant neoplasm of bronchus and lung: Secondary | ICD-10-CM

## 2020-05-05 DIAGNOSIS — Z20822 Contact with and (suspected) exposure to covid-19: Secondary | ICD-10-CM | POA: Diagnosis present

## 2020-05-05 DIAGNOSIS — T83518A Infection and inflammatory reaction due to other urinary catheter, initial encounter: Secondary | ICD-10-CM | POA: Diagnosis not present

## 2020-05-05 DIAGNOSIS — R064 Hyperventilation: Secondary | ICD-10-CM | POA: Diagnosis not present

## 2020-05-05 DIAGNOSIS — J449 Chronic obstructive pulmonary disease, unspecified: Secondary | ICD-10-CM | POA: Diagnosis present

## 2020-05-05 DIAGNOSIS — R6520 Severe sepsis without septic shock: Secondary | ICD-10-CM | POA: Diagnosis not present

## 2020-05-05 DIAGNOSIS — R911 Solitary pulmonary nodule: Secondary | ICD-10-CM | POA: Diagnosis not present

## 2020-05-05 DIAGNOSIS — R0902 Hypoxemia: Secondary | ICD-10-CM

## 2020-05-05 DIAGNOSIS — Z720 Tobacco use: Secondary | ICD-10-CM

## 2020-05-05 DIAGNOSIS — Z7951 Long term (current) use of inhaled steroids: Secondary | ICD-10-CM

## 2020-05-05 DIAGNOSIS — I471 Supraventricular tachycardia: Secondary | ICD-10-CM | POA: Diagnosis not present

## 2020-05-05 DIAGNOSIS — E039 Hypothyroidism, unspecified: Secondary | ICD-10-CM | POA: Diagnosis present

## 2020-05-05 DIAGNOSIS — J441 Chronic obstructive pulmonary disease with (acute) exacerbation: Secondary | ICD-10-CM | POA: Diagnosis present

## 2020-05-05 DIAGNOSIS — G4733 Obstructive sleep apnea (adult) (pediatric): Secondary | ICD-10-CM | POA: Diagnosis present

## 2020-05-05 DIAGNOSIS — Z79899 Other long term (current) drug therapy: Secondary | ICD-10-CM

## 2020-05-05 DIAGNOSIS — J440 Chronic obstructive pulmonary disease with acute lower respiratory infection: Secondary | ICD-10-CM | POA: Diagnosis present

## 2020-05-05 DIAGNOSIS — Z7984 Long term (current) use of oral hypoglycemic drugs: Secondary | ICD-10-CM

## 2020-05-05 DIAGNOSIS — E1142 Type 2 diabetes mellitus with diabetic polyneuropathy: Secondary | ICD-10-CM | POA: Diagnosis present

## 2020-05-05 DIAGNOSIS — J9601 Acute respiratory failure with hypoxia: Secondary | ICD-10-CM

## 2020-05-05 DIAGNOSIS — N138 Other obstructive and reflux uropathy: Secondary | ICD-10-CM | POA: Diagnosis present

## 2020-05-05 DIAGNOSIS — E8809 Other disorders of plasma-protein metabolism, not elsewhere classified: Secondary | ICD-10-CM

## 2020-05-05 DIAGNOSIS — R338 Other retention of urine: Secondary | ICD-10-CM | POA: Diagnosis present

## 2020-05-05 DIAGNOSIS — I152 Hypertension secondary to endocrine disorders: Secondary | ICD-10-CM | POA: Diagnosis present

## 2020-05-05 DIAGNOSIS — R059 Cough, unspecified: Secondary | ICD-10-CM | POA: Diagnosis not present

## 2020-05-05 DIAGNOSIS — A419 Sepsis, unspecified organism: Secondary | ICD-10-CM | POA: Diagnosis present

## 2020-05-05 DIAGNOSIS — Z9221 Personal history of antineoplastic chemotherapy: Secondary | ICD-10-CM

## 2020-05-05 DIAGNOSIS — R0602 Shortness of breath: Secondary | ICD-10-CM | POA: Diagnosis not present

## 2020-05-05 DIAGNOSIS — E785 Hyperlipidemia, unspecified: Secondary | ICD-10-CM | POA: Diagnosis present

## 2020-05-05 DIAGNOSIS — Y846 Urinary catheterization as the cause of abnormal reaction of the patient, or of later complication, without mention of misadventure at the time of the procedure: Secondary | ICD-10-CM | POA: Diagnosis present

## 2020-05-05 DIAGNOSIS — R404 Transient alteration of awareness: Secondary | ICD-10-CM | POA: Diagnosis not present

## 2020-05-05 DIAGNOSIS — J209 Acute bronchitis, unspecified: Secondary | ICD-10-CM | POA: Diagnosis present

## 2020-05-05 DIAGNOSIS — F17210 Nicotine dependence, cigarettes, uncomplicated: Secondary | ICD-10-CM | POA: Diagnosis present

## 2020-05-05 DIAGNOSIS — R509 Fever, unspecified: Secondary | ICD-10-CM | POA: Diagnosis not present

## 2020-05-05 DIAGNOSIS — Z923 Personal history of irradiation: Secondary | ICD-10-CM

## 2020-05-05 DIAGNOSIS — E1165 Type 2 diabetes mellitus with hyperglycemia: Secondary | ICD-10-CM | POA: Diagnosis present

## 2020-05-05 DIAGNOSIS — Z79890 Hormone replacement therapy (postmenopausal): Secondary | ICD-10-CM

## 2020-05-05 LAB — CBC WITH DIFFERENTIAL/PLATELET
Abs Immature Granulocytes: 0.03 10*3/uL (ref 0.00–0.07)
Basophils Absolute: 0.1 10*3/uL (ref 0.0–0.1)
Basophils Relative: 1 %
Eosinophils Absolute: 0.2 10*3/uL (ref 0.0–0.5)
Eosinophils Relative: 1 %
HCT: 43.4 % (ref 39.0–52.0)
Hemoglobin: 14.3 g/dL (ref 13.0–17.0)
Immature Granulocytes: 0 %
Lymphocytes Relative: 2 %
Lymphs Abs: 0.2 10*3/uL — ABNORMAL LOW (ref 0.7–4.0)
MCH: 31 pg (ref 26.0–34.0)
MCHC: 32.9 g/dL (ref 30.0–36.0)
MCV: 93.9 fL (ref 80.0–100.0)
Monocytes Absolute: 0.5 10*3/uL (ref 0.1–1.0)
Monocytes Relative: 4 %
Neutro Abs: 10.8 10*3/uL — ABNORMAL HIGH (ref 1.7–7.7)
Neutrophils Relative %: 92 %
Platelets: 316 10*3/uL (ref 150–400)
RBC: 4.62 MIL/uL (ref 4.22–5.81)
RDW: 15.7 % — ABNORMAL HIGH (ref 11.5–15.5)
WBC: 11.7 10*3/uL — ABNORMAL HIGH (ref 4.0–10.5)
nRBC: 0 % (ref 0.0–0.2)

## 2020-05-05 MED ORDER — LACTATED RINGERS IV BOLUS (SEPSIS)
1000.0000 mL | Freq: Once | INTRAVENOUS | Status: AC
  Administered 2020-05-05: 1000 mL via INTRAVENOUS

## 2020-05-05 MED ORDER — ACETAMINOPHEN 500 MG PO TABS
1000.0000 mg | ORAL_TABLET | Freq: Once | ORAL | Status: AC
  Administered 2020-05-05: 1000 mg via ORAL
  Filled 2020-05-05: qty 2

## 2020-05-05 NOTE — ED Notes (Signed)
Dr.Knapp at bedside  

## 2020-05-05 NOTE — ED Triage Notes (Signed)
[facility] - pt c/o fever and chills. Pt had tylenol PTA for temp. Pt wears O2 PRN at home, currently on 4LPM with EMS. Pt O2 86% on RA upon arrival here

## 2020-05-05 NOTE — ED Notes (Signed)
2 sets of blood cultures drawn, rainbow drawn, sent to lab

## 2020-05-05 NOTE — ED Provider Notes (Addendum)
[facility] EMERGENCY DEPARTMENT Provider Note   CSN: [id number] Arrival date & time: 05/05/20  2253   Time seen 11:16 PM  History Chief Complaint  Patient presents with  . Fever    John [name] is a 75 y.o. male.  HPI   Patient states he was seen by phone by his PCP 2 days ago when he was suffering from chest congestion.  He was started on a Z-Pak and states he just has 2 pills left.  He also had a Covid test which he did not know the result of but when I look at it is negative.  He states he felt well today.  However about 5:30 PM he started having chills and feeling short of breath.  He has oxygen at home that he rarely has to use and he was put on 3 L/min nasal cannula which is the dose he was on in the hospital.  He said his wife became concerned when he was "talking out of my head".  When asked what that meant he stated he was calling out that he wanted people to help him because he felt so bad.  He states his cough has improved on the Z-Pak.  He denies chest pain but did feel short of breath earlier this evening.  He denies wheezing.  He denies abdominal pain but did have nausea but no vomiting or diarrhea.  He has had a Foley catheter since November 1 and said he has had it replaced about 8 times.  He states his urine was cloudy this morning.  He is supposed to have surgery on February 24 for his prostate problems by Dr. Alyson Ingles, urology.  Patient has had UTI sepsis in November and was treated for UTI on December 30 with doxycycline.  He had been scheduled to have a right femoral endarterectomy done by Dr. Carlis Abbott the first part of January, but was canceled due to his UTI.  Patient continues to smoke 1 pack/week.  PCP [name], FNP   Past Medical History:  Diagnosis Date  . Adenocarcinoma of lung, right ([place]) 04/18/2016  . Anxiety   . Arthritis   . Asthma   . BPH (benign prostatic hyperplasia)    with urinary retention 02/06/20  . CHF (congestive heart failure) ([place])    . COPD (chronic obstructive pulmonary disease) ([place])   . Depression   . Diabetes mellitus without complication (HCC)    no meds  . DM (diabetes mellitus) ([place]) 07/09/2016  . Dyspnea   . History of kidney stones   . Hyperlipidemia   . Hypertension   . Hypothyroidism   . Macular degeneration   . Neuropathy   . Non-small cell lung cancer, right ([place]) 04/18/2016  . Peripheral vascular disease ([place])   . Prostatitis   . Pulmonary nodule, left 07/16/2016  . Sleep apnea    cpap    Patient Active Problem List   Diagnosis Date Noted  . Urinary retention 03/09/2020  . Acute cystitis with hematuria   . Complicated UTI (urinary tract infection)   . Dyspnea   . Hypoxia   . Sepsis secondary to UTI ([place]) 03/03/2020  . COPD GOLD III/ group D still smoking  10/24/2019  . PAD (peripheral artery disease) ([place]) 09/13/2019  . Obstructive sleep apnea treated with continuous positive airway pressure (CPAP) 05/31/2018  . Diabetic neuropathy ([place]) 12/29/2017  . Cigarette smoker 12/29/2017  . Hyperlipidemia associated with type 2 diabetes mellitus ([place]) 12/29/2017  . Primary insomnia 12/29/2017  .  Primary osteoarthritis involving multiple joints 12/29/2017  . OSA (obstructive sleep apnea) 12/10/2017  . Hypothyroidism 06/09/2017  . Joint pain 08/05/2016  . Bilateral hand swelling 08/05/2016  . Hyponatremia 08/05/2016  . Pulmonary nodule, left 07/16/2016  . CHF (congestive heart failure) ([place]) 07/09/2016  . DM (diabetes mellitus) ([place]) 07/09/2016  . Non-small cell lung cancer, right ([place]) 04/18/2016  . Essential hypertension 02/01/2016  . COPD (chronic obstructive pulmonary disease) ([place]) 02/01/2016  . BPH with urinary obstruction 02/01/2016    Past Surgical History:  Procedure Laterality Date  . ABDOMINAL AORTOGRAM W/LOWER EXTREMITY Left 02/06/2020   Procedure: ABDOMINAL AORTOGRAM W/LOWER EXTREMITY;  Surgeon: Lorretta Harp, MD;  Location: [facility];  Service: Cardiovascular;   Laterality: Left;  . CATARACT EXTRACTION, BILATERAL Bilateral   . NO PAST SURGERIES    . PORTACATH PLACEMENT Left 06/13/2016   Procedure: INSERTION PORT-A-CATH;  Surgeon: Aviva Signs, MD;  Location: [facility];  Service: General;  Laterality: Left;  Marland Kitchen VIDEO BRONCHOSCOPY WITH ENDOBRONCHIAL NAVIGATION N/A 05/28/2016   Procedure: VIDEO BRONCHOSCOPY WITH ENDOBRONCHIAL NAVIGATION;  Surgeon: [name], MD;  Location: [facility];  Service: Thoracic;  Laterality: N/A;  . VIDEO BRONCHOSCOPY WITH ENDOBRONCHIAL ULTRASOUND N/A 05/28/2016   Procedure: VIDEO BRONCHOSCOPY WITH ENDOBRONCHIAL ULTRASOUND;  Surgeon: [name], MD;  Location: [facility];  Service: Thoracic;  Laterality: N/A;       Family History  Problem Relation Age of Onset  . Hypertension Mother   . Diabetes Father   . Heart disease Father   . Stroke Father   . Hypertension Sister     Social History   Tobacco Use  . Smoking status: Current Every Day Smoker    Packs/day: 0.50    Years: 55.00    Pack years: 27.50    Types: Cigarettes    Start date: 03/11/1961  . Smokeless tobacco: Never Used  . Tobacco comment: 1/2 pack to 1 pack per day 12/14/19  Vaping Use  . Vaping Use: Never used  Substance Use Topics  . Alcohol use: No  . Drug use: No    Home Medications Prior to Admission medications   Medication Sig Start Date End Date Taking? Authorizing Provider  albuterol (VENTOLIN HFA) 108 (90 Base) MCG/ACT inhaler Inhale 2 puffs into the lungs every 4 (four) hours as needed for wheezing or shortness of breath. Patient not taking: No sig reported 12/14/19   Tanda Rockers, MD  amLODipine (NORVASC) 5 MG tablet Take 1 tablet (5 mg total) by mouth daily. 03/20/20   Evelina Dun A, FNP  aspirin EC 81 MG tablet Take 81 mg by mouth daily.    [provider]  azithromycin (ZITHROMAX Z-PAK) 250 MG tablet Take two right away Then one a day for the next 4 days. 05/03/20   Claretta Fraise, MD  Blood Glucose Monitoring Suppl  (ONETOUCH VERIO REFLECT) w/Device KIT Use to test blood sugars daily as directed. DX: E11.9 10/14/19   Evelina Dun A, FNP  cloNIDine (CATAPRES) 0.1 MG tablet TAKE 1 TABLET (0.1 MG TOTAL) BY MOUTH 3 (THREE) TIMES DAILY. 12/19/19   [name], FNP  doxycycline (VIBRA-TABS) 100 MG tablet Take 1 tablet (100 mg total) by mouth 2 (two) times daily. 1 po bid 04/05/20   Hassell Done, Mary-Margaret, FNP  dutasteride (AVODART) 0.5 MG capsule TAKE 1 CAPSULE BY MOUTH EVERY DAY Patient taking differently: Take 0.5 mg by mouth daily. 03/26/20   [name], FNP  furosemide (LASIX) 20 MG tablet Take  1 tablet (20 mg total) by mouth 2 (two) times daily. Patient taking differently: Take 20 mg by mouth daily. 04/25/19   [name], FNP  gabapentin (NEURONTIN) 300 MG capsule Take 1 capsule (300 mg total) by mouth 3 (three) times daily. Patient taking differently: Take 300-600 mg by mouth See admin instructions. Take 1 capsule (300 mg) by mouth in the morning & take 2 capsules (600 mg) by mouth in the evening. 12/16/19   Evelina Dun A, FNP  glucose blood (ONETOUCH VERIO) test strip Use to test blood sugars daily as directed. DX: E11.9 10/14/19   [name], FNP  levothyroxine (SYNTHROID) 75 MCG tablet Take 1 tablet (75 mcg total) by mouth daily. 03/20/20   [name], FNP  linaclotide (LINZESS) 72 MCG capsule Take 1 capsule (72 mcg total) by mouth daily before breakfast. Patient taking differently: Take 72 mcg by mouth daily as needed (constipation.). 03/20/20   [name], FNP  meloxicam (MOBIC) 7.5 MG tablet Take 1 tablet (7.5 mg total) by mouth daily as needed for pain (back pain). 03/20/20   [name], FNP  metFORMIN (GLUCOPHAGE-XR) 500 MG 24 hr tablet Take 1 tablet (500 mg total) by mouth daily with breakfast. 03/20/20   Evelina Dun A, FNP  metoprolol succinate (TOPROL XL) 25 MG 24 hr tablet Take 1 tablet (25 mg total) by mouth daily. 04/17/20   Strader, Fransisco Hertz, PA-C   mupirocin ointment (BACTROBAN) 2 % Apply 1 application topically 3 (three) times daily as needed (wound care.). 02/28/20   [provider]  olmesartan (BENICAR) 40 MG tablet Take 1 tablet (40 mg total) by mouth daily. 10/24/19   Tanda Rockers, MD  ondansetron (ZOFRAN ODT) 4 MG disintegrating tablet Take 1 tablet (4 mg total) by mouth every 8 (eight) hours as needed for nausea or vomiting. 03/20/20   [name], FNP  OneTouch Delica Lancets 67T MISC Use to test blood sugars daily as directed. DX: E11.9 10/14/19   [name], FNP  pravastatin (PRAVACHOL) 40 MG tablet Take 1 tablet (40 mg total) by mouth every Monday, Wednesday, and Friday. At night 03/21/20   Hawks, [name] A, FNP  pseudoephedrine-guaifenesin (MUCINEX D) 60-600 MG 12 hr tablet Take 1 tablet by mouth every 12 (twelve) hours for 10 days. As needed for congestion 05/03/20 05/13/20  Claretta Fraise, MD  Semaglutide (RYBELSUS) 7 MG TABS Take 7 mg by mouth daily. 03/20/20   [name], FNP  tamsulosin (FLOMAX) 0.4 MG CAPS capsule Take 2 capsules (0.8 mg total) by mouth daily after supper. 03/20/20   Hawks, Christy A, FNP  TRELEGY ELLIPTA 100-62.5-25 MCG/INH AEPB Inhale 1 puff into the lungs daily. 03/20/20   [name], FNP    Allergies    Jardiance [empagliflozin] and Lopressor [metoprolol]  Review of Systems   Review of Systems  All other systems reviewed and are negative.   Physical Exam Updated Vital Signs BP 123/65   Pulse (!) 118   Temp (!) 102.3 F (39.1 C) (Rectal)   Resp (!) 21   Ht 5' 6"  (1.676 m)   Wt 77.6 kg   SpO2 93%   BMI 27.61 kg/m   Physical Exam Vitals and nursing note reviewed.  Constitutional:      General: He is not in acute distress.    Appearance: Normal appearance. He is obese. He is not ill-appearing.  HENT:     Head: Normocephalic and atraumatic.  Right Ear: External ear normal.     Left Ear: External ear normal.  Eyes:     Extraocular Movements: Extraocular  movements intact.     Conjunctiva/sclera: Conjunctivae normal.     Pupils: Pupils are equal, round, and reactive to light.  Cardiovascular:     Rate and Rhythm: Tachycardia present.     Heart sounds: Normal heart sounds. No murmur heard.   Pulmonary:     Effort: No respiratory distress.     Breath sounds: No stridor. Rhonchi present. No wheezing or rales.  Chest:     Chest wall: No tenderness.  Abdominal:     General: Bowel sounds are normal. There is no distension.     Palpations: Abdomen is soft.     Tenderness: There is no abdominal tenderness.  Musculoskeletal:        General: Normal range of motion.     Cervical back: Normal range of motion.  Skin:    General: Skin is warm and dry.  Neurological:     General: No focal deficit present.     Mental Status: He is alert and oriented to person, place, and time.     Cranial Nerves: No cranial nerve deficit.  Psychiatric:        Mood and Affect: Mood normal.        Behavior: Behavior normal.        Thought Content: Thought content normal.     ED Results / Procedures / Treatments   Labs (all labs ordered are listed, but only abnormal results are displayed)  Results for orders placed or performed during the hospital encounter of 05/05/20  SARS Coronavirus 2 by RT PCR (hospital order, performed in [place] hospital lab) Nasopharyngeal Nasopharyngeal Swab   Specimen: Nasopharyngeal Swab  Result Value Ref Range   SARS Coronavirus 2 NEGATIVE NEGATIVE  Lactic acid, plasma  Result Value Ref Range   Lactic Acid, Venous 2.1 (HH) 0.5 - 1.9 mmol/L  Comprehensive metabolic panel  Result Value Ref Range   Sodium 141 135 - 145 mmol/L   Potassium 1.5 (LL) 3.5 - 5.1 mmol/L   Chloride 126 (H) 98 - 111 mmol/L   CO2 13 (L) 22 - 32 mmol/L   Glucose, Bld 88 70 - 99 mg/dL   BUN 10 8 - 23 mg/dL   Creatinine, Ser 0.31 (L) 0.61 - 1.24 mg/dL   Calcium 4.1 (LL) 8.9 - 10.3 mg/dL   Total Protein 3.3 (L) 6.5 - 8.1 g/dL   Albumin 1.7 (L) 3.5  - 5.0 g/dL   AST 11 (L) 15 - 41 U/L   ALT 9 0 - 44 U/L   Alkaline Phosphatase 41 38 - 126 U/L   Total Bilirubin 0.3 0.3 - 1.2 mg/dL   GFR, Estimated >60 >60 mL/min   Anion gap 2 (L) 5 - 15  CBC WITH DIFFERENTIAL  Result Value Ref Range   WBC 11.7 (H) 4.0 - 10.5 K/uL   RBC 4.62 4.22 - 5.81 MIL/uL   Hemoglobin 14.3 13.0 - 17.0 g/dL   HCT 43.4 39.0 - 52.0 %   MCV 93.9 80.0 - 100.0 fL   MCH 31.0 26.0 - 34.0 pg   MCHC 32.9 30.0 - 36.0 g/dL   RDW 15.7 (H) 11.5 - 15.5 %   Platelets 316 150 - 400 K/uL   nRBC 0.0 0.0 - 0.2 %   Neutrophils Relative % 92 %   Neutro Abs 10.8 (H) 1.7 - 7.7 K/uL   Lymphocytes Relative  2 %   Lymphs Abs 0.2 (L) 0.7 - 4.0 K/uL   Monocytes Relative 4 %   Monocytes Absolute 0.5 0.1 - 1.0 K/uL   Eosinophils Relative 1 %   Eosinophils Absolute 0.2 0.0 - 0.5 K/uL   Basophils Relative 1 %   Basophils Absolute 0.1 0.0 - 0.1 K/uL   Immature Granulocytes 0 %   Abs Immature Granulocytes 0.03 0.00 - 0.07 K/uL  Urinalysis, Routine w reflex microscopic Urine, Catheterized  Result Value Ref Range   Color, Urine YELLOW YELLOW   APPearance HAZY (A) CLEAR   Specific Gravity, Urine 1.013 1.005 - 1.030   pH 6.0 5.0 - 8.0   Glucose, UA NEGATIVE NEGATIVE mg/dL   Hgb urine dipstick NEGATIVE NEGATIVE   Bilirubin Urine NEGATIVE NEGATIVE   Ketones, ur NEGATIVE NEGATIVE mg/dL   Protein, ur >=300 (A) NEGATIVE mg/dL   Nitrite NEGATIVE NEGATIVE   Leukocytes,Ua SMALL (A) NEGATIVE   RBC / HPF 0-5 0 - 5 RBC/hpf   WBC, UA >50 (H) 0 - 5 WBC/hpf   Bacteria, UA MANY (A) NONE SEEN   Mucus PRESENT   Protime-INR  Result Value Ref Range   Prothrombin Time 13.9 11.4 - 15.2 seconds   INR 1.1 0.8 - 1.2  APTT  Result Value Ref Range   aPTT 29 24 - 36 seconds  Magnesium  Result Value Ref Range   Magnesium 1.7 1.7 - 2.4 mg/dL  Comprehensive metabolic panel  Result Value Ref Range   Sodium 135 135 - 145 mmol/L   Potassium 3.3 (L) 3.5 - 5.1 mmol/L   Chloride 104 98 - 111 mmol/L   CO2  23 22 - 32 mmol/L   Glucose, Bld 159 (H) 70 - 99 mg/dL   BUN 16 8 - 23 mg/dL   Creatinine, Ser 0.84 0.61 - 1.24 mg/dL   Calcium 8.5 (L) 8.9 - 10.3 mg/dL   Total Protein 6.5 6.5 - 8.1 g/dL   Albumin 3.2 (L) 3.5 - 5.0 g/dL   AST 20 15 - 41 U/L   ALT 17 0 - 44 U/L   Alkaline Phosphatase 80 38 - 126 U/L   Total Bilirubin 0.5 0.3 - 1.2 mg/dL   GFR, Estimated >60 >60 mL/min   Anion gap 8 5 - 15    Laboratory interpretation all normal except mild hypokalemia, hyperglycemia, mild lactic acidosis, leukocytosis, UTI  Results for orders placed or performed in visit on 05/03/20  Novel Coronavirus, NAA ([facility])   Specimen: Nasopharyngeal(NP) swabs in vial transport medium  Result Value Ref Range   SARS-CoV-2, NAA Not Detected Not Detected  SARS-COV-2, NAA 2 DAY TAT  Result Value Ref Range   SARS-CoV-2, NAA 2 DAY TAT Performed       EKG EKG Interpretation  Date/Time:  Saturday May 05 2020 23:06:10 EST Ventricular Rate:  132 PR Interval:    QRS Duration: 92 QT Interval:  380 QTC Calculation: 564 R Axis:   31 Text Interpretation: Junctional tachycardia Inferior infarct, old Prolonged QT interval Since last tracing rate faster 03 Mar 2020 Confirmed by Rolland Porter [phone]) on 05/05/2020 11:12:11 PM   Radiology DG Chest Port 1 View  Result Date: 05/05/2020 CLINICAL DATA:  Cough and fever. EXAM: PORTABLE CHEST 1 VIEW COMPARISON:  Radiograph 03/04/2020.  CT 03/23/2020 FINDINGS: Left chest port remains in place, unchanged in position. Chronic hyperinflation and interstitial coarsening. No acute airspace disease. Pulmonary nodule on prior CT are not well seen by radiograph. Right upper lobe pulmonary nodule is  faintly visualized. Stable heart size and mediastinal contours. Aortic atherosclerosis. No pleural fluid or pneumothorax. No pulmonary edema. No acute osseous abnormalities are seen. IMPRESSION: 1. No acute abnormality. 2. Stable hyperinflation and interstitial coarsening. 3. Known  pulmonary nodules are not well seen by radiograph, right upper lobe nodules faintly visualized Electronically Signed   By: Keith Rake M.D.   On: 05/05/2020 23:54    Procedures .Critical Care Performed by: Rolland Porter, MD Authorized by: Rolland Porter, MD   Critical care provider statement:    Critical care time (minutes):  38   Critical care was necessary to treat or prevent imminent or life-threatening deterioration of the following conditions:  Sepsis   Critical care was time spent personally by me on the following activities:  Examination of patient, obtaining history from patient or surrogate, ordering and review of laboratory studies, ordering and review of radiographic studies, pulse oximetry, re-evaluation of patient's condition and review of old charts   Care discussed with: admitting provider       Medications Ordered in ED Medications  lactated ringers infusion ( Intravenous New Bag/Given 05/06/20 0131)  lactated ringers bolus 1,000 mL (0 mLs Intravenous Stopped 05/06/20 0054)  acetaminophen (TYLENOL) tablet 1,000 mg (1,000 mg Oral Given 05/05/20 2344)  potassium chloride SA (KLOR-CON) CR tablet 40 mEq (40 mEq Oral Given 05/06/20 0028)  piperacillin-tazobactam (ZOSYN) IVPB 3.375 g (0 g Intravenous Stopped 05/06/20 0054)  lactated ringers bolus 1,000 mL (1,000 mLs Intravenous New Bag/Given 05/06/20 0130)    ED Course  I have reviewed the triage vital signs and the nursing notes.  Pertinent labs & imaging results that were available during my care of the patient were reviewed by me and considered in my medical decision making (see chart for details).    MDM Rules/Calculators/A&P                          Patient was started on sepsis protocol with lactated Ringer's, treatment of his fever, and laboratory testing.  Nursing staff report his pulse ox was 86% on room air.  12:00 midnight lab has called back a very low potassium and calcium.  This is a major change from prior  testing and totally unexpected.  In retrospect nursing feels that may be when they drew the blood from his IV site they did not waste first so it may have been diluted with his IV fluids started by EMS.  We will redraw the c-Met and add a magnesium.  He has normal kidney function so he was given potassium orally just in case his potassium was actually low.  1:10 AM patient's repeat c-Met has resulted and is more consistent with his prior labs.  He has a very mild hypokalemia now.  I had already given him 40 mEq of potassium when the extremely low one had resulted in case  it was not a lab error.  Patient's tachycardia has been slow to improve, he had been given acetaminophen for fever.  His heart rate did improve from the 130s down to 118.  01:38 AM Dr Josephine Cables, hospitalist will see patient for admission.  Final Clinical Impression(s) / ED Diagnoses Final diagnoses:  Febrile urinary tract infection  Hypokalemia  Hypoxia    Rx / DC Orders  Plan admission  Rolland Porter, MD, Barbette Or, MD 05/06/20 2297    Rolland Porter, MD 05/06/20 [phone]

## 2020-05-06 ENCOUNTER — Encounter: Payer: Self-pay | Admitting: Family Medicine

## 2020-05-06 DIAGNOSIS — E872 Acidosis, unspecified: Secondary | ICD-10-CM

## 2020-05-06 DIAGNOSIS — A419 Sepsis, unspecified organism: Secondary | ICD-10-CM

## 2020-05-06 DIAGNOSIS — J9621 Acute and chronic respiratory failure with hypoxia: Secondary | ICD-10-CM | POA: Diagnosis present

## 2020-05-06 DIAGNOSIS — N401 Enlarged prostate with lower urinary tract symptoms: Secondary | ICD-10-CM

## 2020-05-06 DIAGNOSIS — T83518A Infection and inflammatory reaction due to other urinary catheter, initial encounter: Secondary | ICD-10-CM | POA: Diagnosis present

## 2020-05-06 DIAGNOSIS — J9601 Acute respiratory failure with hypoxia: Secondary | ICD-10-CM

## 2020-05-06 DIAGNOSIS — Z7984 Long term (current) use of oral hypoglycemic drugs: Secondary | ICD-10-CM | POA: Diagnosis not present

## 2020-05-06 DIAGNOSIS — J449 Chronic obstructive pulmonary disease, unspecified: Secondary | ICD-10-CM | POA: Diagnosis not present

## 2020-05-06 DIAGNOSIS — E039 Hypothyroidism, unspecified: Secondary | ICD-10-CM

## 2020-05-06 DIAGNOSIS — G4733 Obstructive sleep apnea (adult) (pediatric): Secondary | ICD-10-CM | POA: Diagnosis present

## 2020-05-06 DIAGNOSIS — E1165 Type 2 diabetes mellitus with hyperglycemia: Secondary | ICD-10-CM | POA: Diagnosis present

## 2020-05-06 DIAGNOSIS — A4151 Sepsis due to Escherichia coli [E. coli]: Secondary | ICD-10-CM | POA: Diagnosis present

## 2020-05-06 DIAGNOSIS — E8809 Other disorders of plasma-protein metabolism, not elsewhere classified: Secondary | ICD-10-CM

## 2020-05-06 DIAGNOSIS — J40 Bronchitis, not specified as acute or chronic: Secondary | ICD-10-CM

## 2020-05-06 DIAGNOSIS — Z79899 Other long term (current) drug therapy: Secondary | ICD-10-CM | POA: Diagnosis not present

## 2020-05-06 DIAGNOSIS — E876 Hypokalemia: Secondary | ICD-10-CM | POA: Diagnosis not present

## 2020-05-06 DIAGNOSIS — Z720 Tobacco use: Secondary | ICD-10-CM | POA: Diagnosis not present

## 2020-05-06 DIAGNOSIS — R6520 Severe sepsis without septic shock: Secondary | ICD-10-CM

## 2020-05-06 DIAGNOSIS — Y846 Urinary catheterization as the cause of abnormal reaction of the patient, or of later complication, without mention of misadventure at the time of the procedure: Secondary | ICD-10-CM | POA: Diagnosis present

## 2020-05-06 DIAGNOSIS — J441 Chronic obstructive pulmonary disease with (acute) exacerbation: Secondary | ICD-10-CM | POA: Diagnosis present

## 2020-05-06 DIAGNOSIS — N138 Other obstructive and reflux uropathy: Secondary | ICD-10-CM

## 2020-05-06 DIAGNOSIS — N390 Urinary tract infection, site not specified: Secondary | ICD-10-CM | POA: Diagnosis present

## 2020-05-06 DIAGNOSIS — E785 Hyperlipidemia, unspecified: Secondary | ICD-10-CM | POA: Diagnosis present

## 2020-05-06 DIAGNOSIS — Z7951 Long term (current) use of inhaled steroids: Secondary | ICD-10-CM | POA: Diagnosis not present

## 2020-05-06 DIAGNOSIS — Z79890 Hormone replacement therapy (postmenopausal): Secondary | ICD-10-CM | POA: Diagnosis not present

## 2020-05-06 DIAGNOSIS — Z7982 Long term (current) use of aspirin: Secondary | ICD-10-CM | POA: Diagnosis not present

## 2020-05-06 DIAGNOSIS — J440 Chronic obstructive pulmonary disease with acute lower respiratory infection: Secondary | ICD-10-CM | POA: Diagnosis present

## 2020-05-06 DIAGNOSIS — Z923 Personal history of irradiation: Secondary | ICD-10-CM | POA: Diagnosis not present

## 2020-05-06 DIAGNOSIS — I10 Essential (primary) hypertension: Secondary | ICD-10-CM | POA: Diagnosis not present

## 2020-05-06 DIAGNOSIS — R9431 Abnormal electrocardiogram [ECG] [EKG]: Secondary | ICD-10-CM

## 2020-05-06 DIAGNOSIS — Z20822 Contact with and (suspected) exposure to covid-19: Secondary | ICD-10-CM | POA: Diagnosis present

## 2020-05-06 DIAGNOSIS — Z85118 Personal history of other malignant neoplasm of bronchus and lung: Secondary | ICD-10-CM | POA: Diagnosis not present

## 2020-05-06 DIAGNOSIS — Z9221 Personal history of antineoplastic chemotherapy: Secondary | ICD-10-CM | POA: Diagnosis not present

## 2020-05-06 DIAGNOSIS — E1151 Type 2 diabetes mellitus with diabetic peripheral angiopathy without gangrene: Secondary | ICD-10-CM | POA: Diagnosis present

## 2020-05-06 DIAGNOSIS — F17210 Nicotine dependence, cigarettes, uncomplicated: Secondary | ICD-10-CM | POA: Diagnosis present

## 2020-05-06 LAB — COMPREHENSIVE METABOLIC PANEL
ALT: 15 U/L (ref 0–44)
ALT: 17 U/L (ref 0–44)
ALT: 9 U/L (ref 0–44)
AST: 11 U/L — ABNORMAL LOW (ref 15–41)
AST: 18 U/L (ref 15–41)
AST: 20 U/L (ref 15–41)
Albumin: 1.7 g/dL — ABNORMAL LOW (ref 3.5–5.0)
Albumin: 3.1 g/dL — ABNORMAL LOW (ref 3.5–5.0)
Albumin: 3.2 g/dL — ABNORMAL LOW (ref 3.5–5.0)
Alkaline Phosphatase: 41 U/L (ref 38–126)
Alkaline Phosphatase: 72 U/L (ref 38–126)
Alkaline Phosphatase: 80 U/L (ref 38–126)
Anion gap: 2 — ABNORMAL LOW (ref 5–15)
Anion gap: 7 (ref 5–15)
Anion gap: 8 (ref 5–15)
BUN: 10 mg/dL (ref 8–23)
BUN: 13 mg/dL (ref 8–23)
BUN: 16 mg/dL (ref 8–23)
CO2: 13 mmol/L — ABNORMAL LOW (ref 22–32)
CO2: 23 mmol/L (ref 22–32)
CO2: 24 mmol/L (ref 22–32)
Calcium: 4.1 mg/dL — CL (ref 8.9–10.3)
Calcium: 8.5 mg/dL — ABNORMAL LOW (ref 8.9–10.3)
Calcium: 8.8 mg/dL — ABNORMAL LOW (ref 8.9–10.3)
Chloride: 104 mmol/L (ref 98–111)
Chloride: 106 mmol/L (ref 98–111)
Chloride: 126 mmol/L — ABNORMAL HIGH (ref 98–111)
Creatinine, Ser: 0.31 mg/dL — ABNORMAL LOW (ref 0.61–1.24)
Creatinine, Ser: 0.78 mg/dL (ref 0.61–1.24)
Creatinine, Ser: 0.84 mg/dL (ref 0.61–1.24)
GFR, Estimated: >60 mL/min (ref >60)
GFR, Estimated: >60 mL/min (ref >60)
GFR, Estimated: >60 mL/min (ref >60)
Glucose, Bld: 129 mg/dL — ABNORMAL HIGH (ref 70–99)
Glucose, Bld: 159 mg/dL — ABNORMAL HIGH (ref 70–99)
Glucose, Bld: 88 mg/dL (ref 70–99)
Potassium: 1.5 mmol/L — CL (ref 3.5–5.1)
Potassium: 3.3 mmol/L — ABNORMAL LOW (ref 3.5–5.1)
Potassium: 4 mmol/L (ref 3.5–5.1)
Sodium: 135 mmol/L (ref 135–145)
Sodium: 137 mmol/L (ref 135–145)
Sodium: 141 mmol/L (ref 135–145)
Total Bilirubin: 0.3 mg/dL (ref 0.3–1.2)
Total Bilirubin: 0.5 mg/dL (ref 0.3–1.2)
Total Bilirubin: 0.6 mg/dL (ref 0.3–1.2)
Total Protein: 3.3 g/dL — ABNORMAL LOW (ref 6.5–8.1)
Total Protein: 6.5 g/dL (ref 6.5–8.1)
Total Protein: 6.5 g/dL (ref 6.5–8.1)

## 2020-05-06 LAB — URINALYSIS, ROUTINE W REFLEX MICROSCOPIC
Bilirubin Urine: NEGATIVE
Glucose, UA: NEGATIVE mg/dL
Hgb urine dipstick: NEGATIVE
Ketones, ur: NEGATIVE mg/dL
Nitrite: NEGATIVE
Protein, ur: >=300 mg/dL — AB
Specific Gravity, Urine: 1.013 (ref 1.005–1.030)
WBC, UA: >50 WBC/hpf — ABNORMAL HIGH (ref 0–5)
pH: 6 (ref 5.0–8.0)

## 2020-05-06 LAB — PROTIME-INR
INR: 1.1 (ref 0.8–1.2)
INR: 1.1 (ref 0.8–1.2)
Prothrombin Time: 13.9 seconds (ref 11.4–15.2)
Prothrombin Time: 14.2 seconds (ref 11.4–15.2)

## 2020-05-06 LAB — HEMOGLOBIN A1C
Hgb A1c MFr Bld: 6.7 % — ABNORMAL HIGH (ref 4.8–5.6)
Mean Plasma Glucose: 145.59 mg/dL

## 2020-05-06 LAB — CBC
HCT: 39.1 % (ref 39.0–52.0)
Hemoglobin: 12.8 g/dL — ABNORMAL LOW (ref 13.0–17.0)
MCH: 31.1 pg (ref 26.0–34.0)
MCHC: 32.7 g/dL (ref 30.0–36.0)
MCV: 94.9 fL (ref 80.0–100.0)
Platelets: 296 10*3/uL (ref 150–400)
RBC: 4.12 MIL/uL — ABNORMAL LOW (ref 4.22–5.81)
RDW: 15.7 % — ABNORMAL HIGH (ref 11.5–15.5)
WBC: 16.7 10*3/uL — ABNORMAL HIGH (ref 4.0–10.5)
nRBC: 0 % (ref 0.0–0.2)

## 2020-05-06 LAB — CBG MONITORING, ED
Glucose-Capillary: 147 mg/dL — ABNORMAL HIGH (ref 70–99)
Glucose-Capillary: 191 mg/dL — ABNORMAL HIGH (ref 70–99)
Glucose-Capillary: 203 mg/dL — ABNORMAL HIGH (ref 70–99)
Glucose-Capillary: 227 mg/dL — ABNORMAL HIGH (ref 70–99)

## 2020-05-06 LAB — SARS CORONAVIRUS 2 BY RT PCR (HOSPITAL ORDER, PERFORMED IN ~~LOC~~ HOSPITAL LAB): SARS Coronavirus 2: NEGATIVE

## 2020-05-06 LAB — APTT
aPTT: 29 seconds (ref 24–36)
aPTT: 33 seconds (ref 24–36)

## 2020-05-06 LAB — MAGNESIUM
Magnesium: 1.7 mg/dL (ref 1.7–2.4)
Magnesium: 1.8 mg/dL (ref 1.7–2.4)

## 2020-05-06 LAB — PHOSPHORUS: Phosphorus: 2.8 mg/dL (ref 2.5–4.6)

## 2020-05-06 LAB — LACTIC ACID, PLASMA
Lactic Acid, Venous: 2.1 mmol/L (ref 0.5–1.9)
Lactic Acid, Venous: 1.5 mmol/L (ref 0.5–1.9)

## 2020-05-06 MED ORDER — DUTASTERIDE 0.5 MG PO CAPS
0.5000 mg | ORAL_CAPSULE | Freq: Every day | ORAL | Status: DC
  Administered 2020-05-06 – 2020-05-08 (×3): 0.5 mg via ORAL
  Filled 2020-05-06 (×5): qty 1

## 2020-05-06 MED ORDER — LACTATED RINGERS IV SOLN
INTRAVENOUS | Status: DC
  Administered 2020-05-06: 1000 mL via INTRAVENOUS

## 2020-05-06 MED ORDER — ACETAMINOPHEN 325 MG PO TABS
650.0000 mg | ORAL_TABLET | Freq: Four times a day (QID) | ORAL | Status: DC | PRN
  Administered 2020-05-06: 650 mg via ORAL
  Filled 2020-05-06: qty 2

## 2020-05-06 MED ORDER — GABAPENTIN 300 MG PO CAPS
300.0000 mg | ORAL_CAPSULE | Freq: Every morning | ORAL | Status: DC
  Administered 2020-05-06 – 2020-05-08 (×3): 300 mg via ORAL
  Filled 2020-05-06 (×3): qty 1

## 2020-05-06 MED ORDER — INSULIN ASPART 100 UNIT/ML ~~LOC~~ SOLN
0.0000 [IU] | Freq: Three times a day (TID) | SUBCUTANEOUS | Status: DC
  Administered 2020-05-06: 5 [IU] via SUBCUTANEOUS
  Administered 2020-05-06: 3 [IU] via SUBCUTANEOUS
  Administered 2020-05-06: 2 [IU] via SUBCUTANEOUS
  Administered 2020-05-07: 5 [IU] via SUBCUTANEOUS
  Administered 2020-05-07: 2 [IU] via SUBCUTANEOUS
  Administered 2020-05-07: 5 [IU] via SUBCUTANEOUS
  Administered 2020-05-08: 3 [IU] via SUBCUTANEOUS
  Filled 2020-05-06 (×5): qty 1

## 2020-05-06 MED ORDER — PIPERACILLIN-TAZOBACTAM 3.375 G IVPB 30 MIN
3.3750 g | Freq: Once | INTRAVENOUS | Status: AC
  Administered 2020-05-06: 3.375 g via INTRAVENOUS
  Filled 2020-05-06: qty 50

## 2020-05-06 MED ORDER — POTASSIUM CHLORIDE CRYS ER 20 MEQ PO TBCR
40.0000 meq | EXTENDED_RELEASE_TABLET | Freq: Once | ORAL | Status: AC
  Administered 2020-05-06: 40 meq via ORAL
  Filled 2020-05-06: qty 2

## 2020-05-06 MED ORDER — ASPIRIN EC 81 MG PO TBEC
81.0000 mg | DELAYED_RELEASE_TABLET | Freq: Every day | ORAL | Status: DC
  Administered 2020-05-06 – 2020-05-08 (×3): 81 mg via ORAL
  Filled 2020-05-06 (×4): qty 1

## 2020-05-06 MED ORDER — INSULIN ASPART 100 UNIT/ML ~~LOC~~ SOLN
0.0000 [IU] | Freq: Every day | SUBCUTANEOUS | Status: DC
  Administered 2020-05-06: 2 [IU] via SUBCUTANEOUS
  Filled 2020-05-06 (×2): qty 1

## 2020-05-06 MED ORDER — AZITHROMYCIN 250 MG PO TABS
250.0000 mg | ORAL_TABLET | Freq: Every day | ORAL | Status: AC
  Administered 2020-05-06 – 2020-05-07 (×2): 250 mg via ORAL
  Filled 2020-05-06 (×2): qty 1

## 2020-05-06 MED ORDER — LINACLOTIDE 72 MCG PO CAPS
72.0000 ug | ORAL_CAPSULE | Freq: Every day | ORAL | Status: DC | PRN
  Filled 2020-05-06: qty 1

## 2020-05-06 MED ORDER — LEVOTHYROXINE SODIUM 50 MCG PO TABS: 75.0000 ug | ORAL_TABLET | Freq: Every day | ORAL | Status: DC

## 2020-05-06 MED ORDER — PIPERACILLIN-TAZOBACTAM 3.375 G IVPB 30 MIN: 3.3750 g | Freq: Four times a day (QID) | INTRAVENOUS | Status: DC

## 2020-05-06 MED ORDER — GABAPENTIN 300 MG PO CAPS
600.0000 mg | ORAL_CAPSULE | Freq: Every day | ORAL | Status: DC
  Administered 2020-05-06 – 2020-05-07 (×2): 600 mg via ORAL
  Filled 2020-05-06 (×2): qty 2

## 2020-05-06 MED ORDER — PIPERACILLIN-TAZOBACTAM 3.375 G IVPB
3.3750 g | Freq: Three times a day (TID) | INTRAVENOUS | Status: DC
  Administered 2020-05-06 – 2020-05-08 (×7): 3.375 g via INTRAVENOUS
  Filled 2020-05-06 (×7): qty 50

## 2020-05-06 MED ORDER — PSEUDOEPHEDRINE HCL 60 MG PO TABS
60.0000 mg | ORAL_TABLET | Freq: Two times a day (BID) | ORAL | Status: DC | PRN
  Filled 2020-05-06: qty 1

## 2020-05-06 MED ORDER — LEVOTHYROXINE SODIUM 75 MCG PO TABS
75.0000 ug | ORAL_TABLET | Freq: Every day | ORAL | Status: DC
  Administered 2020-05-06 – 2020-05-08 (×3): 75 ug via ORAL
  Filled 2020-05-06: qty 2
  Filled 2020-05-06: qty 1
  Filled 2020-05-06: qty 2

## 2020-05-06 MED ORDER — FLUTICASONE FUROATE-VILANTEROL 100-25 MCG/INH IN AEPB: 1.0000 | INHALATION_SPRAY | Freq: Every day | RESPIRATORY_TRACT | Status: DC

## 2020-05-06 MED ORDER — PSEUDOEPHEDRINE-GUAIFENESIN ER 60-600 MG PO TB12: 1.0000 | ORAL_TABLET | Freq: Two times a day (BID) | ORAL | Status: DC

## 2020-05-06 MED ORDER — ENOXAPARIN SODIUM 40 MG/0.4ML ~~LOC~~ SOLN
40.0000 mg | SUBCUTANEOUS | Status: DC
  Administered 2020-05-06 – 2020-05-08 (×3): 40 mg via SUBCUTANEOUS
  Filled 2020-05-06 (×3): qty 0.4

## 2020-05-06 MED ORDER — METHYLPREDNISOLONE SODIUM SUCC 125 MG IJ SOLR
125.0000 mg | Freq: Once | INTRAMUSCULAR | Status: AC
  Administered 2020-05-06: 125 mg via INTRAVENOUS
  Filled 2020-05-06: qty 2

## 2020-05-06 MED ORDER — LACTATED RINGERS IV BOLUS
1000.0000 mL | Freq: Once | INTRAVENOUS | Status: AC
  Administered 2020-05-06: 1000 mL via INTRAVENOUS

## 2020-05-06 MED ORDER — GLUCERNA SHAKE PO LIQD
237.0000 mL | Freq: Three times a day (TID) | ORAL | Status: DC
  Administered 2020-05-06 – 2020-05-08 (×6): 237 mL via ORAL
  Filled 2020-05-06 (×8): qty 237

## 2020-05-06 MED ORDER — IPRATROPIUM-ALBUTEROL 0.5-2.5 (3) MG/3ML IN SOLN
3.0000 mL | Freq: Four times a day (QID) | RESPIRATORY_TRACT | Status: DC
  Administered 2020-05-06 (×3): 3 mL via RESPIRATORY_TRACT
  Filled 2020-05-06 (×3): qty 3

## 2020-05-06 MED ORDER — FLUTICASONE-UMECLIDIN-VILANT 100-62.5-25 MCG/INH IN AEPB: 1.0000 | INHALATION_SPRAY | Freq: Every day | RESPIRATORY_TRACT | Status: DC

## 2020-05-06 MED ORDER — AMLODIPINE BESYLATE 5 MG PO TABS
5.0000 mg | ORAL_TABLET | Freq: Every day | ORAL | Status: DC
Start: 2020-05-06 — End: 2020-05-06

## 2020-05-06 MED ORDER — BUDESONIDE 0.5 MG/2ML IN SUSP
0.5000 mg | Freq: Two times a day (BID) | RESPIRATORY_TRACT | Status: DC
  Administered 2020-05-06 – 2020-05-07 (×4): 0.5 mg via RESPIRATORY_TRACT
  Filled 2020-05-06 (×4): qty 2

## 2020-05-06 MED ORDER — PRAVASTATIN SODIUM 40 MG PO TABS
40.0000 mg | ORAL_TABLET | ORAL | Status: DC
  Administered 2020-05-07: 40 mg via ORAL
  Filled 2020-05-06 (×2): qty 1

## 2020-05-06 MED ORDER — UMECLIDINIUM BROMIDE 62.5 MCG/INH IN AEPB: 1.0000 | INHALATION_SPRAY | Freq: Every day | RESPIRATORY_TRACT | Status: DC

## 2020-05-06 MED ORDER — GUAIFENESIN ER 600 MG PO TB12: 600.0000 mg | ORAL_TABLET | Freq: Two times a day (BID) | ORAL | Status: DC | PRN

## 2020-05-06 MED ORDER — METOPROLOL SUCCINATE ER 25 MG PO TB24
25.0000 mg | ORAL_TABLET | Freq: Every day | ORAL | Status: DC
  Administered 2020-05-06 – 2020-05-07 (×2): 25 mg via ORAL
  Filled 2020-05-06: qty 1

## 2020-05-06 MED ORDER — TAMSULOSIN HCL 0.4 MG PO CAPS
0.8000 mg | ORAL_CAPSULE | Freq: Every day | ORAL | Status: DC
  Administered 2020-05-06 – 2020-05-07 (×2): 0.8 mg via ORAL
  Filled 2020-05-06 (×2): qty 2

## 2020-05-06 NOTE — ED Notes (Signed)
Date and time results received: 05/06/20 12:11 AM(use smartphrase ".now" to insert current time)  Test: potassium Critical Value: 1.5  Name of Provider Notified: Dr Tomi Bamberger  Orders Received? Or Actions Taken?: see chart

## 2020-05-06 NOTE — ED Notes (Signed)
Pt came in with foley- this was removed

## 2020-05-06 NOTE — ED Notes (Signed)
Resp called for neb treatment for patient per Dr Doristine Devoid request.

## 2020-05-06 NOTE — ED Notes (Signed)
Date and time results received: 05/06/20 12:12 AM(use smartphrase ".now" to insert current time)  Test: calcium Critical Value: 4.1  Name of Provider Notified: Dr Tomi Bamberger  Orders Received? Or Actions Taken?: see chart

## 2020-05-06 NOTE — Progress Notes (Signed)
PROGRESS NOTE  John [name] ZOX:[id number] DOB: 03-May-1945 DOA: 05/05/2020 PCP: [name], FNP  Brief History:  75 year old male with a history of peripheral arterial disease, COPD, OSA, diabetes mellitus type 2, hyperlipidemia, hypertension, hypothyroidism, right-sided non-small cell lung carcinoma, and chronic respiratory failure on 3 L as needed presenting with fever and shortness of breath that began around 5 PM on 05/05/2020.  The patient states that he has had some chest congestion for the past several days.  He had a virtual visit with his PCP on 05/03/2020, and he was prescribed azithromycin which he feels has helped his cough.  Review of the records shows that the patient has chronic dyspnea on exertion which he states has not been any worse this past week.  He denied any chest pain, hemoptysis, vomiting, diarrhea, abdominal pain, hematuria, hematochezia, melena.  He has had some nausea.  His spouse noted that the patient had some confusion, and he was " talking out of his head" on the evening of 05/05/2020.  He had associated subjective fevers and chills with shortness of breath at that time.  As result, the patient was brought to the hospital. Notably, the patient has a history of BPH and is tentatively scheduled for TURP on 05/31/2020 with Dr. Nicolette Bang.  He states that he has a chronic indwelling Foley catheter which was placed in the beginning of November 2021.  He had his Foley catheter last changed on 02/17/2021.  Notably, the patient also had a hospital admission from 03/03/2020 to 03/05/2020 with sepsis secondary to UTI.  He was discharged home with oral antibiotics.  During that hospitalization, the patient was also discharged home with 3 L nasal cannula oxygen. In the emergency department, the patient was febrile up to 102.3 F with tachycardia in the 130s.  He was hemodynamically stable.  Oxygen saturation was 96 to 98% on 2 L. Notably, his first set of labs were  felt to be spurious.  Repeat labs did show some improvement in his BMP with potassium 3.3, sodium 135, serum creatinine 0.84.  LFTs were unremarkable.  Its unclear if his initial CBC was also spurious.  Nevertheless it showed WBC 11.7, hemoglobin 14.3, platelets 216,000.  Lactic acid 2.1>> 1.5 the patient was started IV Zosyn.  Assessment/Plan: Severe sepsis -Secondary to CAUTI -Lactic acid peaked 2.1>> 1.5 -Judicious IV fluids -Follow blood and urine cultures  CAUTI -Foley catheter has been changed in the emergency department -Follow urine cultures -Continue empiric Zosyn for now  Acute on chronic respiratory failure with hypoxia -Patient was discharged home on 3 L nasal cannula on 03/05/2020 -He states that his oxygen saturation was in the mid to upper 90s at home on room air, so he has not been using it -Currently stable on 2 L nasal cannula with saturation 96-98% -personally reviewed CXR---chronic interstitial markings, no consolidation -personally reviewed EKG--sinus, no concerning STT chnages  COPD exacerbation -Patient is having increased short of breath and wheezing during my examination -Start IV steroids -Start duo nebs -Start Pulmicort  Tobacco abuse -The patient has approximately 70-pack-year history of tobacco -He continues to smoke 1 pack every 4 days -Cessation discussed  BPH -Continue Avodart and Flomax -Tentatively scheduled for TURP on 05/31/2020  Diabetes mellitus type 2 -03/20/2020 hemoglobin A1c 6.8 -Holding Metformin -NovoLog sliding scale  Essential hypertension -Continue metoprolol succinate -Holding amlodipine temporarily  Peripheral arterial disease -Patient previously had right femoral endarterectomy scheduled with Dr. Monica Martinez in November  2021 -This has been delayed secondary to his numerous UTIs and will be rescheduled after his TURP -Continue aspirin  Hypokalemia -Replete -Check magnesium  Hypothyroidism -Continue  Synthroid  Hyperlipidemia -Continue statin  Right NSCLC--stage IIIa -Follows Dr. Delton Coombes -Appears to be in remission, but continues to follow-up regarding his pulmonary nodules -s/p Chemoradiation with weekly carboplatin and paclitaxel from 06/25/2016 to 08/20/2016. -s/p Consolidation durvalumab from 09/26/2016 to 09/25/2017.     Status is: Inpatient  Remains inpatient appropriate because:need for IV therapies   Dispo: The patient is from: Home              Anticipated d/c is to: Home              Anticipated d/c date is: 2 days              Patient currently is not medically stable to d/c.   Difficult to place patient No        Family Communication:   Family at bedside  Consultants:  none  Code Status:  FULL   DVT Prophylaxis:   [place]bee Lovenox   Procedures: As Listed in Progress Note Above  Antibiotics: Zosyn 1/30>>     Subjective: Patient complains of some shortness of breath and wheezing.  He has a nonproductive cough.  He denies any vomiting but has some nausea.  He denies any chest pain, abdominal pain, hematuria, hematochezia, melena, headache.  Objective: Vitals:   05/06/20 0500 05/06/20 0530 05/06/20 0630 05/06/20 0700  BP: 125/65 133/75 130/68 127/80  Pulse: 97 99 93 98  Resp: 14 (!) 21 17 19   Temp:      TempSrc:      SpO2: 94% 94% 95% 94%  Weight:      Height:        Intake/Output Summary (Last 24 hours) at 05/06/2020 0716 Last data filed at 05/06/2020 0250 Gross per 24 hour  Intake 2000 ml  Output 75 ml  Net 1925 ml   Weight change:  Exam:   General:  Pt is alert, follows commands appropriately, not in acute distress  HEENT: No icterus, No thrush, No neck mass, [place]/AT  Cardiovascular: RRR, S1/S2, no rubs, no gallops  Respiratory: Bibasilar rales.  Bilateral expiratory wheeze.  Abdomen: Soft/+BS, non tender, non distended, no guarding  Extremities: No edema, No lymphangitis, No petechiae, No rashes, no synovitis   Data  Reviewed: I have personally reviewed following labs and imaging studies Basic Metabolic Panel: Recent Labs  Lab 05/05/20 2300 05/06/20 0033  NA 141 135  K 1.5* 3.3*  CL 126* 104  CO2 13* 23  GLUCOSE 88 159*  BUN 10 16  CREATININE 0.31* 0.84  CALCIUM 4.1* 8.5*  MG  --  1.7   Liver Function Tests: Recent Labs  Lab 05/05/20 2300 05/06/20 0033  AST 11* 20  ALT 9 17  ALKPHOS 41 80  BILITOT 0.3 0.5  PROT 3.3* 6.5  ALBUMIN 1.7* 3.2*   No results for input(s): LIPASE, AMYLASE in the last 168 hours. No results for input(s): AMMONIA in the last 168 hours. Coagulation Profile: Recent Labs  Lab 05/06/20 0033 05/06/20 0547  INR 1.1 1.1   CBC: Recent Labs  Lab 05/05/20 2300 05/06/20 0547  WBC 11.7* 16.7*  NEUTROABS 10.8*  --   HGB 14.3 12.8*  HCT 43.4 39.1  MCV 93.9 94.9  PLT 316 296   Cardiac Enzymes: No results for input(s): CKTOTAL, CKMB, CKMBINDEX, TROPONINI in the last 168 hours. BNP: Invalid input(s):  POCBNP CBG: No results for input(s): GLUCAP in the last 168 hours. HbA1C: No results for input(s): HGBA1C in the last 72 hours. Urine analysis:    Component Value Date/Time   COLORURINE YELLOW 05/05/2020 2300   APPEARANCEUR HAZY (A) 05/05/2020 2300   APPEARANCEUR Cloudy (A) 04/05/2020 1000   LABSPEC 1.013 05/05/2020 2300   PHURINE 6.0 05/05/2020 2300   GLUCOSEU NEGATIVE 05/05/2020 2300   HGBUR NEGATIVE 05/05/2020 2300   BILIRUBINUR NEGATIVE 05/05/2020 2300   BILIRUBINUR Negative 04/05/2020 1000   KETONESUR NEGATIVE 05/05/2020 2300   PROTEINUR >=300 (A) 05/05/2020 2300   NITRITE NEGATIVE 05/05/2020 2300   LEUKOCYTESUR SMALL (A) 05/05/2020 2300   Sepsis Labs: @LABRCNTIP (procalcitonin:4,lacticidven:4) ) Recent Results (from the past 240 hour(s))  Novel Coronavirus, NAA ([facility])     Status: None   Collection Time: 05/03/20  1:57 PM   Specimen: Nasopharyngeal(NP) swabs in vial transport medium  Result Value Ref Range Status   SARS-CoV-2, NAA Not  Detected Not Detected Final    Comment: This nucleic acid amplification test was developed and its performance characteristics determined by [employer]. Nucleic acid amplification tests include RT-PCR and TMA. This test has not been FDA cleared or approved. This test has been authorized by FDA under an Emergency Use Authorization (EUA). This test is only authorized for the duration of time the declaration that circumstances exist justifying the authorization of the emergency use of in vitro diagnostic tests for detection of SARS-CoV-2 virus and/or diagnosis of COVID-19 infection under section 564(b)(1) of the Act, 21 U.S.C. 967RFF-6(B) (1), unless the authorization is terminated or revoked sooner. When diagnostic testing is negative, the possibility of a false negative result should be considered in the context of a patient's recent exposures and the presence of clinical signs and symptoms consistent with COVID-19. An individual without symptoms of COVID-19 and who is not shedding SARS-CoV-2 virus wo uld expect to have a negative (not detected) result in this assay.   SARS-COV-2, NAA 2 DAY Ziah Leandro     Status: None   Collection Time: 05/03/20  1:57 PM  Result Value Ref Range Status   SARS-CoV-2, NAA 2 DAY Kingsley Herandez Performed  Final  Blood culture (routine single)     Status: None (Preliminary result)   Collection Time: 05/05/20 11:00 PM   Specimen: BLOOD RIGHT HAND  Result Value Ref Range Status   Specimen Description BLOOD RIGHT HAND  Final   Special Requests   Final    BOTTLES DRAWN AEROBIC AND ANAEROBIC Blood Culture adequate volume   Culture   Final    NO GROWTH < 12 HOURS Performed at [facility], [address], [place], [place] [address]    Report Status PENDING  Incomplete  SARS Coronavirus 2 by RT PCR (hospital order, performed in [place] hospital lab) Nasopharyngeal Nasopharyngeal Swab     Status: None   Collection Time: 05/05/20 11:00 PM   Specimen: Nasopharyngeal  Swab  Result Value Ref Range Status   SARS Coronavirus 2 NEGATIVE NEGATIVE Final    Comment: (NOTE) SARS-CoV-2 target nucleic acids are NOT DETECTED.  The SARS-CoV-2 RNA is generally detectable in upper and lower respiratory specimens during the acute phase of infection. The lowest concentration of SARS-CoV-2 viral copies this assay can detect is 250 copies / mL. A negative result does not preclude SARS-CoV-2 infection and should not be used as the sole basis for treatment or other patient management decisions.  A negative result may occur with improper specimen collection / handling, submission of  specimen other than nasopharyngeal swab, presence of viral mutation(s) within the areas targeted by this assay, and inadequate number of viral copies (<250 copies / mL). A negative result must be combined with clinical observations, patient history, and epidemiological information.  Fact Sheet for Patients:   StrictlyIdeas.no  Fact Sheet for Healthcare Providers: BankingDealers.co.za  This test is not yet approved or  cleared by the [place] FDA and has been authorized for detection and/or diagnosis of SARS-CoV-2 by FDA under an Emergency Use Authorization (EUA).  This EUA will remain in effect (meaning this test can be used) for the duration of the COVID-19 declaration under Section 564(b)(1) of the Act, 21 U.S.C. section 360bbb-3(b)(1), unless the authorization is terminated or revoked sooner.  Performed at [facility], [address], [place], [place] [address]      Scheduled Meds: . amLODipine  5 mg Oral Daily  . aspirin EC  81 mg Oral Daily  . azithromycin  250 mg Oral Daily  . dutasteride  0.5 mg Oral Daily  . enoxaparin (LOVENOX) injection  40 mg Subcutaneous Q24H  . feeding supplement (GLUCERNA SHAKE)  237 mL Oral TID BM  . fluticasone furoate-vilanterol  1 puff Inhalation Daily  . insulin aspart  0-15 Units Subcutaneous  TID WC  . insulin aspart  0-5 Units Subcutaneous QHS  . ipratropium-albuterol  3 mL Nebulization Q6H  . levothyroxine  75 mcg Oral Q0600  . methylPREDNISolone (SOLU-MEDROL) injection  125 mg Intravenous Once  . metoprolol succinate  25 mg Oral Daily  . [START ON 05/07/2020] pravastatin  40 mg Oral Q M,W,F  . tamsulosin  0.8 mg Oral QPC supper   Continuous Infusions: . lactated ringers 100 mL/hr at 05/06/20 0131  . piperacillin-tazobactam (ZOSYN)  IV      Procedures/Studies: DG Chest Port 1 View  Result Date: 05/05/2020 CLINICAL DATA:  Cough and fever. EXAM: PORTABLE CHEST 1 VIEW COMPARISON:  Radiograph 03/04/2020.  CT 03/23/2020 FINDINGS: Left chest port remains in place, unchanged in position. Chronic hyperinflation and interstitial coarsening. No acute airspace disease. Pulmonary nodule on prior CT are not well seen by radiograph. Right upper lobe pulmonary nodule is faintly visualized. Stable heart size and mediastinal contours. Aortic atherosclerosis. No pleural fluid or pneumothorax. No pulmonary edema. No acute osseous abnormalities are seen. IMPRESSION: 1. No acute abnormality. 2. Stable hyperinflation and interstitial coarsening. 3. Known pulmonary nodules are not well seen by radiograph, right upper lobe nodules faintly visualized Electronically Signed   By: Keith Rake M.D.   On: 05/05/2020 23:54    Orson Eva, DO  [facility]  If 7PM-7AM, please contact night-coverage [email or web address] Password [email or web address] 05/06/2020, 7:16 AM   LOS: 0 days

## 2020-05-06 NOTE — H&P (Signed)
History and Physical  John [name] IKU:[id number] DOB: January 11, 1946 DOA: 05/05/2020  Referring physician: Devoria Albe, MD PCP: John Spencer, FNP  Patient coming from: Home  Chief Complaint: Fever  HPI: John [name] is a 75 y.o. male with medical history significant for obstructive sleep apnea, type 2 diabetes mellitus, BPH, COPD, depression, hypertension, hyperlipidemia, peripheral arterial disease and recent peripheral angiography where he was found with severe arterial disease on his right lower extremity who presents to the emergency department due to sudden onset of fever and chills which started around 5:30 PM yesterday (1/29), this was associated with shortness of breath.  He has oxygen at home (provided when discharged on 03/05/2020 after being admitted on 03/03/2020 due to UTI sepsis) which he has not been using, he states that he had to use the oxygen today.  Patient states that he had some chest congestion with productive cough of clear phlegm, he already saw his PCP for this and was prescribed with a Z-Pak and patient states that the cough has improved since onset of the medication.  Patient states that he has had a Foley catheter since February 06, 2020 and that he has had two prior UTIs since insertion of the catheter, patient states that he has a pending prostate surgery by Dr. Ronne [name] (urology) in February of this year due to BPH.  He denies chest pain, vomiting, diarrhea or abdominal pain.  ED Course:  In the emergency department, he was febrile with a temperature of 102.28F, he was also tachypneic and tachycardic and was hypoxic with an O2 sat of 86% on room air, this improved to 95% on supplemental oxygen via [place] at 2 PM.  Work-up in the ED showed leukocytosis, hyperglycemia, hypoalbuminemia, hypokalemia.  Lactic acid 2.1 > 1.5.  Urinalysis was positive for proteinuria.  SARS coronavirus two was negative. Chest x-ray showed no acute abnormality. IV hydration per sepsis protocol was  provided, Tylenol was given due to fever and patient was treated with IV Zosyn.  Potassium was replenished.  Hospitalist was asked to admit patient for further evaluation and management.  Review of Systems: Constitutional: Negative for chills and fever.  HENT: Negative for ear pain and sore throat.   Eyes: Negative for pain and visual disturbance.  Respiratory: Positive for cough and shortness of breath.   Cardiovascular: Negative for chest pain and palpitations.  Gastrointestinal: Negative for abdominal pain and vomiting.  Endocrine: Negative for polyphagia and polyuria.  Genitourinary: Negative for decreased urine volume, dysuria Musculoskeletal: Negative for arthralgias and back pain.  Skin: Negative for color change and rash.  Allergic/Immunologic: Negative for immunocompromised state.  Neurological: Negative for tremors, syncope, speech difficulty, weakness, light-headedness and headaches.  Hematological: Does not bruise/bleed easily.  All other systems reviewed and are negative    Past Medical History:  Diagnosis Date  . Adenocarcinoma of lung, right (HCC) 04/18/2016  . Anxiety   . Arthritis   . Asthma   . BPH (benign prostatic hyperplasia)    with urinary retention 02/06/20  . CHF (congestive heart failure) (HCC)   . COPD (chronic obstructive pulmonary disease) (HCC)   . Depression   . Diabetes mellitus without complication (HCC)    no meds  . DM (diabetes mellitus) (HCC) 07/09/2016  . Dyspnea   . History of kidney stones   . Hyperlipidemia   . Hypertension   . Hypothyroidism   . Macular degeneration   . Neuropathy   . Non-small cell lung cancer, right (HCC) 04/18/2016  . Peripheral vascular  disease ([place])   . Prostatitis   . Pulmonary nodule, left 07/16/2016  . Sleep apnea    cpap   Past Surgical History:  Procedure Laterality Date  . ABDOMINAL AORTOGRAM W/LOWER EXTREMITY Left 02/06/2020   Procedure: ABDOMINAL AORTOGRAM W/LOWER EXTREMITY;  Surgeon: Lorretta Harp, MD;  Location: [facility];  Service: Cardiovascular;  Laterality: Left;  . CATARACT EXTRACTION, BILATERAL Bilateral   . NO PAST SURGERIES    . PORTACATH PLACEMENT Left 06/13/2016   Procedure: INSERTION PORT-A-CATH;  Surgeon: Aviva Signs, MD;  Location: [facility];  Service: General;  Laterality: Left;  Marland Kitchen VIDEO BRONCHOSCOPY WITH ENDOBRONCHIAL NAVIGATION N/A 05/28/2016   Procedure: VIDEO BRONCHOSCOPY WITH ENDOBRONCHIAL NAVIGATION;  Surgeon: [name], MD;  Location: [facility];  Service: Thoracic;  Laterality: N/A;  . VIDEO BRONCHOSCOPY WITH ENDOBRONCHIAL ULTRASOUND N/A 05/28/2016   Procedure: VIDEO BRONCHOSCOPY WITH ENDOBRONCHIAL ULTRASOUND;  Surgeon: [name], MD;  Location: [facility];  Service: Thoracic;  Laterality: N/A;    Social History:  reports that he has been smoking cigarettes. He started smoking about 59 years ago. He has a 27.50 pack-year smoking history. He has never used smokeless tobacco. He reports that he does not drink alcohol and does not use drugs.   Allergies  Allergen Reactions  . Jardiance [Empagliflozin]     FEELS SLUGGISH, TIRED  . Lopressor [Metoprolol]     Fatigue    Family History  Problem Relation Age of Onset  . Hypertension Mother   . Diabetes Father   . Heart disease Father   . Stroke Father   . Hypertension Sister     Prior to Admission medications   Medication Sig Start Date End Date Taking? Authorizing Provider  albuterol (VENTOLIN HFA) 108 (90 Base) MCG/ACT inhaler Inhale 2 puffs into the lungs every 4 (four) hours as needed for wheezing or shortness of breath. Patient not taking: No sig reported 12/14/19   Tanda Rockers, MD  amLODipine (NORVASC) 5 MG tablet Take 1 tablet (5 mg total) by mouth daily. 03/20/20   Evelina Dun A, FNP  aspirin EC 81 MG tablet Take 81 mg by mouth daily.    [provider]  azithromycin (ZITHROMAX Z-PAK) 250 MG tablet Take two right away Then one a day for the next 4 days. 05/03/20   Claretta Fraise, MD  Blood Glucose Monitoring Suppl (ONETOUCH VERIO REFLECT) w/Device KIT Use to test blood sugars daily as directed. DX: E11.9 10/14/19   Evelina Dun A, FNP  cloNIDine (CATAPRES) 0.1 MG tablet TAKE 1 TABLET (0.1 MG TOTAL) BY MOUTH 3 (THREE) TIMES DAILY. 12/19/19   [name], FNP  doxycycline (VIBRA-TABS) 100 MG tablet Take 1 tablet (100 mg total) by mouth 2 (two) times daily. 1 po bid 04/05/20   Hassell Done, Mary-Margaret, FNP  dutasteride (AVODART) 0.5 MG capsule TAKE 1 CAPSULE BY MOUTH EVERY DAY Patient taking differently: Take 0.5 mg by mouth daily. 03/26/20   [name], FNP  furosemide (LASIX) 20 MG tablet Take 1 tablet (20 mg total) by mouth 2 (two) times daily. Patient taking differently: Take 20 mg by mouth daily. 04/25/19   [name], FNP  gabapentin (NEURONTIN) 300 MG capsule Take 1 capsule (300 mg total) by mouth 3 (three) times daily. Patient taking differently: Take 300-600 mg by mouth See admin instructions. Take 1 capsule (300 mg) by mouth in the morning & take 2 capsules (600 mg) by mouth in the evening. 12/16/19   Hawks,  Christy A, FNP  glucose blood (ONETOUCH VERIO) test strip Use to test blood sugars daily as directed. DX: E11.9 10/14/19   [name], FNP  levothyroxine (SYNTHROID) 75 MCG tablet Take 1 tablet (75 mcg total) by mouth daily. 03/20/20   [name], FNP  linaclotide (LINZESS) 72 MCG capsule Take 1 capsule (72 mcg total) by mouth daily before breakfast. Patient taking differently: Take 72 mcg by mouth daily as needed (constipation.). 03/20/20   [name], FNP  meloxicam (MOBIC) 7.5 MG tablet Take 1 tablet (7.5 mg total) by mouth daily as needed for pain (back pain). 03/20/20   [name], FNP  metFORMIN (GLUCOPHAGE-XR) 500 MG 24 hr tablet Take 1 tablet (500 mg total) by mouth daily with breakfast. 03/20/20   Evelina Dun A, FNP  metoprolol succinate (TOPROL XL) 25 MG 24 hr tablet Take 1 tablet (25 mg total) by mouth daily.  04/17/20   Strader, Fransisco Hertz, PA-C  mupirocin ointment (BACTROBAN) 2 % Apply 1 application topically 3 (three) times daily as needed (wound care.). 02/28/20   [provider]  olmesartan (BENICAR) 40 MG tablet Take 1 tablet (40 mg total) by mouth daily. 10/24/19   Tanda Rockers, MD  ondansetron (ZOFRAN ODT) 4 MG disintegrating tablet Take 1 tablet (4 mg total) by mouth every 8 (eight) hours as needed for nausea or vomiting. 03/20/20   [name], FNP  OneTouch Delica Lancets 19F MISC Use to test blood sugars daily as directed. DX: E11.9 10/14/19   [name], FNP  pravastatin (PRAVACHOL) 40 MG tablet Take 1 tablet (40 mg total) by mouth every Monday, Wednesday, and Friday. At night 03/21/20   Hawks, [name] A, FNP  pseudoephedrine-guaifenesin (MUCINEX D) 60-600 MG 12 hr tablet Take 1 tablet by mouth every 12 (twelve) hours for 10 days. As needed for congestion 05/03/20 05/13/20  Claretta Fraise, MD  Semaglutide (RYBELSUS) 7 MG TABS Take 7 mg by mouth daily. 03/20/20   [name], FNP  tamsulosin (FLOMAX) 0.4 MG CAPS capsule Take 2 capsules (0.8 mg total) by mouth daily after supper. 03/20/20   Hawks, Christy A, FNP  TRELEGY ELLIPTA 100-62.5-25 MCG/INH AEPB Inhale 1 puff into the lungs daily. 03/20/20   [name], FNP    Physical Exam: BP 114/68   Pulse (!) 104   Temp 99 F (37.2 C) (Rectal)   Resp 17   Ht $R'5\' 6"'yz$  (1.676 m)   Wt 77.6 kg   SpO2 91%   BMI 27.61 kg/m   . General: 75 y.o. year-old male well developed well nourished in no acute distress.  Alert and oriented x3. Marland Kitchen HEENT: NCAT, EOMI . Neck: Supple, trachea media. . Cardiovascular: Tachycardia.  Regular rate and rhythm with no rubs or gallops.  No thyromegaly or JVD noted.  2/4 pulses in all 4 extremities. Marland Kitchen Respiratory: Tachypnea.  Clear to auscultation with no wheezes or rales.  . Abdomen: Soft nontender nondistended with normal bowel sounds x4 quadrants. . Muskuloskeletal: No cyanosis, clubbing or  edema noted bilaterally . Neuro: CN II-XII intact, strength 5/5 x 4, sensation, reflexes intact . Skin: No ulcerative lesions noted or rashes . Psychiatry: Judgement and insight appear normal. Mood is appropriate for condition and setting          Labs on Admission:  Basic Metabolic Panel: Recent Labs  Lab 05/05/20 2300 05/06/20 0033  NA 141 135  K 1.5* 3.3*  CL 126* 104  CO2 13* 23  GLUCOSE 88 159*  BUN 10 16  CREATININE 0.31* 0.84  CALCIUM 4.1* 8.5*  MG  --  1.7   Liver Function Tests: Recent Labs  Lab 05/05/20 2300 05/06/20 0033  AST 11* 20  ALT 9 17  ALKPHOS 41 80  BILITOT 0.3 0.5  PROT 3.3* 6.5  ALBUMIN 1.7* 3.2*   No results for input(s): LIPASE, AMYLASE in the last 168 hours. No results for input(s): AMMONIA in the last 168 hours. CBC: Recent Labs  Lab 05/05/20 2300  WBC 11.7*  NEUTROABS 10.8*  HGB 14.3  HCT 43.4  MCV 93.9  PLT 316   Cardiac Enzymes: No results for input(s): CKTOTAL, CKMB, CKMBINDEX, TROPONINI in the last 168 hours.  BNP (last 3 results) No results for input(s): BNP in the last 8760 hours.  ProBNP (last 3 results) No results for input(s): PROBNP in the last 8760 hours.  CBG: No results for input(s): GLUCAP in the last 168 hours.  Radiological Exams on Admission: DG Chest Port 1 View  Result Date: 05/05/2020 CLINICAL DATA:  Cough and fever. EXAM: PORTABLE CHEST 1 VIEW COMPARISON:  Radiograph 03/04/2020.  CT 03/23/2020 FINDINGS: Left chest port remains in place, unchanged in position. Chronic hyperinflation and interstitial coarsening. No acute airspace disease. Pulmonary nodule on prior CT are not well seen by radiograph. Right upper lobe pulmonary nodule is faintly visualized. Stable heart size and mediastinal contours. Aortic atherosclerosis. No pleural fluid or pneumothorax. No pulmonary edema. No acute osseous abnormalities are seen. IMPRESSION: 1. No acute abnormality. 2. Stable hyperinflation and interstitial coarsening. 3.  Known pulmonary nodules are not well seen by radiograph, right upper lobe nodules faintly visualized Electronically Signed   By: Keith Rake M.D.   On: 05/05/2020 23:54    EKG: I independently viewed the EKG done and my findings are as followed: Junctional tachycardia at a rate of 132 bpm with prolonged QTc (54ms)  Assessment/Plan Present on Admission: . Sepsis secondary to UTI ([place]) . Essential hypertension . COPD (chronic obstructive pulmonary disease) ([place]) . BPH with urinary obstruction . OSA (obstructive sleep apnea) . Hypothyroidism  Principal Problem:   Sepsis secondary to UTI [facility]) Active Problems:   Essential hypertension   COPD (chronic obstructive pulmonary disease) (HCC)   BPH with urinary obstruction   Hypothyroidism   OSA (obstructive sleep apnea)   Hypokalemia   Hyperglycemia due to diabetes mellitus (HCC)   Hypoalbuminemia   Prolonged QT interval   Lactic acidosis   Bronchitis due to tobacco use   Acute respiratory failure with hypoxia (HCC)   Sepsis secondary to UTI POA Patient was febrile, tachycardic, tachypnea (met SIRS criteria), source of infection-UTI (met sepsis criteria) Patient has been using Foley catheter for 8 days, this was changed IV hydration per sepsis protocol was provided, continue IV hydration Patient was started on IV Zosyn, we shall continue with same at this time Urine culture pending Urine culture done on 04/05/2020 demonstrated pansensitive E. Coli.  Lactic acidosis-resolved Lactic acidosis 2.1 > 1.5  Acute bronchitis/acute hypoxic respiratory failure Patient was started on azithromycin as an outpatient, continue remaining today treatment Continue supplemental oxygen to maintain O2 sat > 92% and wean as tolerated Continue incentive telemetry and flutter valve Continue Mucinex  Prolonged QTc (564 ms Avoid QT prolonging drugs Magnesium level will be checked  Hypokalemia K+ 3.3 (of note, for CMP on 05/06/2020 at 0011 was  an error per ED physician) Potassium was replenished  Essential hypertension Continue amlodipine and Toprol-XL  Hyperglycemia secondary to  type 2 diabetes mellitus A1c 7.5 (12/16/2019); repeat A1c Continue ISS and hypoglycemic protocol  Hypoalbuminemia possibly secondary to mild protein calorie malnutrition Albumin 3.2, protein supplement will be provided  Peripheral arterial disease Continue aspirin. Patient was scheduled for right femoral endarterectomy in the first week of January by Dr. Carlis Abbott  but this was canceled due to UTI per ED medical record  BPH with ongoing urinary retention Continue Flomax and Avodart Continue Foley catheter   History of COPD Continue Trelegy  Obstructive sleep apnea/hypoxia Continue CPAP nightly  Hypothyroidism Continue Synthroid.  History of chronic constipation Continue Linzess  DVT prophylaxis: Lovenox  Code Status: Full code  Family Communication: None at bedside  Disposition Plan:  Patient is from:                        home Anticipated DC to:                   SNF or family members home Anticipated DC date:               2-3 days Anticipated DC barriers:          Patient was able to be discharged at this time due to sepsis secondary to UTI requiring IV antibiotics   Consults called: None  Admission status: Inpatient   Bernadette Hoit MD [facility]  05/06/2020, 3:46 AM

## 2020-05-06 NOTE — ED Notes (Signed)
Date and time results received: 05/06/20 0006   Test: LACTIC Critical Value: 2.1  Name of Provider Notified: Tomi Bamberger, MD

## 2020-05-07 ENCOUNTER — Other Ambulatory Visit: Payer: Self-pay

## 2020-05-07 DIAGNOSIS — N390 Urinary tract infection, site not specified: Secondary | ICD-10-CM

## 2020-05-07 DIAGNOSIS — A4151 Sepsis due to Escherichia coli [E. coli]: Secondary | ICD-10-CM

## 2020-05-07 LAB — BASIC METABOLIC PANEL
Anion gap: 8 (ref 5–15)
BUN: 12 mg/dL (ref 8–23)
CO2: 25 mmol/L (ref 22–32)
Calcium: 9.3 mg/dL (ref 8.9–10.3)
Chloride: 104 mmol/L (ref 98–111)
Creatinine, Ser: 0.7 mg/dL (ref 0.61–1.24)
GFR, Estimated: >60 mL/min (ref >60)
Glucose, Bld: 148 mg/dL — ABNORMAL HIGH (ref 70–99)
Potassium: 3.4 mmol/L — ABNORMAL LOW (ref 3.5–5.1)
Sodium: 137 mmol/L (ref 135–145)

## 2020-05-07 LAB — CBG MONITORING, ED
Glucose-Capillary: 125 mg/dL — ABNORMAL HIGH (ref 70–99)
Glucose-Capillary: 204 mg/dL — ABNORMAL HIGH (ref 70–99)

## 2020-05-07 LAB — CBC
HCT: 40.9 % (ref 39.0–52.0)
Hemoglobin: 13.3 g/dL (ref 13.0–17.0)
MCH: 30.9 pg (ref 26.0–34.0)
MCHC: 32.5 g/dL (ref 30.0–36.0)
MCV: 95.1 fL (ref 80.0–100.0)
Platelets: 284 10*3/uL (ref 150–400)
RBC: 4.3 MIL/uL (ref 4.22–5.81)
RDW: 16.2 % — ABNORMAL HIGH (ref 11.5–15.5)
WBC: 12.5 10*3/uL — ABNORMAL HIGH (ref 4.0–10.5)
nRBC: 0 % (ref 0.0–0.2)

## 2020-05-07 LAB — GLUCOSE, CAPILLARY
Glucose-Capillary: 223 mg/dL — ABNORMAL HIGH (ref 70–99)
Glucose-Capillary: 198 mg/dL — ABNORMAL HIGH (ref 70–99)

## 2020-05-07 MED ORDER — CHLORHEXIDINE GLUCONATE CLOTH 2 % EX PADS
6.0000 | MEDICATED_PAD | Freq: Every day | CUTANEOUS | Status: DC
  Administered 2020-05-07 – 2020-05-08 (×2): 6 via TOPICAL

## 2020-05-07 MED ORDER — POTASSIUM CHLORIDE CRYS ER 20 MEQ PO TBCR
20.0000 meq | EXTENDED_RELEASE_TABLET | Freq: Once | ORAL | Status: AC
  Administered 2020-05-07: 20 meq via ORAL
  Filled 2020-05-07: qty 1

## 2020-05-07 MED ORDER — METHYLPREDNISOLONE SODIUM SUCC 125 MG IJ SOLR
60.0000 mg | Freq: Two times a day (BID) | INTRAMUSCULAR | Status: DC
  Administered 2020-05-07 – 2020-05-08 (×3): 60 mg via INTRAVENOUS
  Filled 2020-05-07 (×3): qty 2

## 2020-05-07 MED ORDER — METOPROLOL SUCCINATE ER 25 MG PO TB24
25.0000 mg | ORAL_TABLET | Freq: Once | ORAL | Status: AC
  Administered 2020-05-07: 25 mg via ORAL
  Filled 2020-05-07: qty 1

## 2020-05-07 MED ORDER — IPRATROPIUM-ALBUTEROL 0.5-2.5 (3) MG/3ML IN SOLN
3.0000 mL | Freq: Three times a day (TID) | RESPIRATORY_TRACT | Status: DC
  Administered 2020-05-07 – 2020-05-08 (×4): 3 mL via RESPIRATORY_TRACT
  Filled 2020-05-07 (×4): qty 3

## 2020-05-07 MED ORDER — METOPROLOL SUCCINATE ER 50 MG PO TB24
50.0000 mg | ORAL_TABLET | Freq: Every day | ORAL | Status: DC
Start: 2020-05-08 — End: 2020-05-08
  Administered 2020-05-08: 50 mg via ORAL
  Filled 2020-05-07: qty 1

## 2020-05-07 MED ORDER — MELATONIN 3 MG PO TABS
6.0000 mg | ORAL_TABLET | Freq: Every day | ORAL | Status: DC
  Administered 2020-05-07: 6 mg via ORAL
  Filled 2020-05-07: qty 2

## 2020-05-07 NOTE — ED Notes (Signed)
Unable to give report at this time.

## 2020-05-07 NOTE — Progress Notes (Signed)
PROGRESS NOTE  John [name] DJM:[id number] DOB: 1945/07/20 DOA: 05/05/2020 PCP: [name], FNP   Brief History:  75 year old male with a history of peripheral arterial disease, COPD, OSA, diabetes mellitus type 2, hyperlipidemia, hypertension, hypothyroidism, right-sided non-small cell lung carcinoma, and chronic respiratory failure on 3 L as needed presenting with fever and shortness of breath that began around 5 PM on 05/05/2020.  The patient states that he has had some chest congestion for the past several days.  He had a virtual visit with his PCP on 05/03/2020, and he was prescribed azithromycin which he feels has helped his cough.  Review of the records shows that the patient has chronic dyspnea on exertion which he states has not been any worse this past week.  He denied any chest pain, hemoptysis, vomiting, diarrhea, abdominal pain, hematuria, hematochezia, melena.  He has had some nausea.  His spouse noted that the patient had some confusion, and he was " talking out of his head" on the evening of 05/05/2020.  He had associated subjective fevers and chills with shortness of breath at that time.  As result, the patient was brought to the hospital. Notably, the patient has a history of BPH and is tentatively scheduled for TURP on 05/31/2020 with Dr. Nicolette Bang.  He states that he has a chronic indwelling Foley catheter which was placed in the beginning of November 2021.  He had his Foley catheter last changed on 02/17/2021.  Notably, the patient also had a hospital admission from 03/03/2020 to 03/05/2020 with sepsis secondary to UTI.  He was discharged home with oral antibiotics.  During that hospitalization, the patient was also discharged home with 3 L nasal cannula oxygen. In the emergency department, the patient was febrile up to 102.3 F with tachycardia in the 130s.  He was hemodynamically stable.  Oxygen saturation was 96 to 98% on 2 L. Notably, his first set of labs were  felt to be spurious.  Repeat labs did show some improvement in his BMP with potassium 3.3, sodium 135, serum creatinine 0.84.  LFTs were unremarkable.  Its unclear if his initial CBC was also spurious.  Nevertheless it showed WBC 11.7, hemoglobin 14.3, platelets 216,000.  Lactic acid 2.1>> 1.5 the patient was started IV Zosyn.  Assessment/Plan: Severe sepsis -Secondary to CAUTI -Lactic acid peaked 2.1>> 1.5 -Judicious IV fluids -Follow blood and urine cultures -sepsis physiology resolved  CAUTI--E.Coli -Foley catheter has been changed in the emergency department -Follow urine cultures -Continue empiric Zosyn for now  Acute on chronic respiratory failure with hypoxia -Patient was discharged home on 3 L nasal cannula on 03/05/2020 -He states that his oxygen saturation was in the mid to upper 90s at home on room air, so he has not been using it -Currently stable on 2.5 L nasal cannula with saturation 96-98% -personally reviewed CXR---chronic interstitial markings, no consolidation -personally reviewed EKG--sinus, no concerning STT chnages  COPD exacerbation -Patient is having increased short of breath and wheezing during my examination -Started IV steroids -Started duo nebs -Started Pulmicort  Tobacco abuse -The patient has approximately 70-pack-year history of tobacco -He continues to smoke 1 pack every 4 days -Cessation discussed  BPH -Continue Avodart and Flomax -Tentatively scheduled for TURP on 05/31/2020  Diabetes mellitus type 2 -03/20/2020 hemoglobin A1c 6.8 -Holding Metformin -NovoLog sliding scale  Essential hypertension -Continue metoprolol succinate--increase to 50 mg -Holding amlodipine temporarily  Peripheral arterial disease -Patient previously had right femoral endarterectomy  scheduled with Dr. Monica Martinez in November 2021 -This has been delayed secondary to his numerous UTIs and will be rescheduled after his TURP -Continue  aspirin  Hypokalemia -Replete -Check magnesium  Hypothyroidism -Continue Synthroid  Hyperlipidemia -Continue statin  Right NSCLC--stage IIIa -Follows Dr. Delton Coombes -Appears to be in remission, but continues to follow-up regarding his pulmonary nodules -s/p Chemoradiation with weekly carboplatin and paclitaxel from 06/25/2016 to 08/20/2016. -s/p Consolidation durvalumab from 09/26/2016 to 09/25/2017.     Status is: Inpatient  Remains inpatient appropriate because:need for IV therapies   Dispo: The patient is from: Home  Anticipated d/c is to: Home  Anticipated d/c date is: 2 days  Patient currently is not medically stable to d/c.              Difficult to place patient No        Family Communication:  no Family at bedside  Consultants:  none  Code Status:  FULL   DVT Prophylaxis:   [place] Lovenox   Procedures: As Listed in Progress Note Above  Antibiotics: Zosyn 1/30>>     Subjective: Patient is breathing better, but still has some dyspnea on exertion.  Denies f/c, cp, n/v/d, abd pain.  Objective: Vitals:   05/07/20 1200 05/07/20 1230 05/07/20 1418 05/07/20 1439  BP: 132/72 136/85  (!) 150/85  Pulse: (!) 115 (!) 118  (!) 114  Resp: 19 19  19   Temp:    98 F (36.7 C)  TempSrc:    Oral  SpO2: 94% 93% 92% 95%  Weight:    77 kg  Height:    5\' 6"  (1.676 m)    Intake/Output Summary (Last 24 hours) at 05/07/2020 1541 Last data filed at 05/06/2020 2049 Gross per 24 hour  Intake 41.18 ml  Output 1500 ml  Net -1458.82 ml   Weight change:  Exam:   General:  Pt is alert, follows commands appropriately, not in acute distress  HEENT: No icterus, No thrush, No neck mass, [place]/AT  Cardiovascular: RRR, S1/S2, no rubs, no gallops  Respiratory: bibasilar rales.  Bibasilar wheeze  Abdomen: Soft/+BS, non tender, non distended, no guarding  Extremities: No edema, No lymphangitis, No petechiae,  No rashes, no synovitis   Data Reviewed: I have personally reviewed following labs and imaging studies Basic Metabolic Panel: Recent Labs  Lab 05/05/20 2300 05/06/20 0033 05/06/20 0547 05/07/20 0953  NA 141 135 137 137  K 1.5* 3.3* 4.0 3.4*  CL 126* 104 106 104  CO2 13* 23 24 25   GLUCOSE 88 159* 129* 148*  BUN 10 16 13 12   CREATININE 0.31* 0.84 0.78 0.70  CALCIUM 4.1* 8.5* 8.8* 9.3  MG  --  1.7 1.8  --   PHOS  --   --  2.8  --    Liver Function Tests: Recent Labs  Lab 05/05/20 2300 05/06/20 0033 05/06/20 0547  AST 11* 20 18  ALT 9 17 15   ALKPHOS 41 80 72  BILITOT 0.3 0.5 0.6  PROT 3.3* 6.5 6.5  ALBUMIN 1.7* 3.2* 3.1*   No results for input(s): LIPASE, AMYLASE in the last 168 hours. No results for input(s): AMMONIA in the last 168 hours. Coagulation Profile: Recent Labs  Lab 05/06/20 0033 05/06/20 0547  INR 1.1 1.1   CBC: Recent Labs  Lab 05/05/20 2300 05/06/20 0547 05/07/20 0953  WBC 11.7* 16.7* 12.5*  NEUTROABS 10.8*  --   --   HGB 14.3 12.8* 13.3  HCT 43.4 39.1 40.9  MCV 93.9  94.9 95.1  PLT 316 296 284   Cardiac Enzymes: No results for input(s): CKTOTAL, CKMB, CKMBINDEX, TROPONINI in the last 168 hours. BNP: Invalid input(s): POCBNP CBG: Recent Labs  Lab 05/06/20 1225 05/06/20 1756 05/06/20 2055 05/07/20 0839 05/07/20 1147  GLUCAP 191* 203* 227* 125* 204*   HbA1C: Recent Labs    05/06/20 0547  HGBA1C 6.7*   Urine analysis:    Component Value Date/Time   COLORURINE YELLOW 05/05/2020 2300   APPEARANCEUR HAZY (A) 05/05/2020 2300   APPEARANCEUR Cloudy (A) 04/05/2020 1000   LABSPEC 1.013 05/05/2020 2300   PHURINE 6.0 05/05/2020 2300   GLUCOSEU NEGATIVE 05/05/2020 2300   HGBUR NEGATIVE 05/05/2020 2300   BILIRUBINUR NEGATIVE 05/05/2020 2300   BILIRUBINUR Negative 04/05/2020 1000   KETONESUR NEGATIVE 05/05/2020 2300   PROTEINUR >=300 (A) 05/05/2020 2300   NITRITE NEGATIVE 05/05/2020 2300   LEUKOCYTESUR SMALL (A) 05/05/2020 2300    Sepsis Labs: @LABRCNTIP (procalcitonin:4,lacticidven:4) ) Recent Results (from the past 240 hour(s))  Novel Coronavirus, NAA ([facility])     Status: None   Collection Time: 05/03/20  1:57 PM   Specimen: Nasopharyngeal(NP) swabs in vial transport medium  Result Value Ref Range Status   SARS-CoV-2, NAA Not Detected Not Detected Final    Comment: This nucleic acid amplification test was developed and its performance characteristics determined by [employer]. Nucleic acid amplification tests include RT-PCR and TMA. This test has not been FDA cleared or approved. This test has been authorized by FDA under an Emergency Use Authorization (EUA). This test is only authorized for the duration of time the declaration that circumstances exist justifying the authorization of the emergency use of in vitro diagnostic tests for detection of SARS-CoV-2 virus and/or diagnosis of COVID-19 infection under section 564(b)(1) of the Act, 21 U.S.C. 149FWY-6(V) (1), unless the authorization is terminated or revoked sooner. When diagnostic testing is negative, the possibility of a false negative result should be considered in the context of a patient's recent exposures and the presence of clinical signs and symptoms consistent with COVID-19. An individual without symptoms of COVID-19 and who is not shedding SARS-CoV-2 virus wo uld expect to have a negative (not detected) result in this assay.   SARS-COV-2, NAA 2 DAY Kaylen Motl     Status: None   Collection Time: 05/03/20  1:57 PM  Result Value Ref Range Status   SARS-CoV-2, NAA 2 DAY Myeisha Kruser Performed  Final  Blood culture (routine single)     Status: None (Preliminary result)   Collection Time: 05/05/20 11:00 PM   Specimen: BLOOD RIGHT HAND  Result Value Ref Range Status   Specimen Description BLOOD RIGHT HAND  Final   Special Requests   Final    BOTTLES DRAWN AEROBIC AND ANAEROBIC Blood Culture adequate volume   Culture   Final    NO GROWTH 2  DAYS Performed at [facility], [address], [place], [place] [address]    Report Status PENDING  Incomplete  Urine culture     Status: Abnormal (Preliminary result)   Collection Time: 05/05/20 11:00 PM   Specimen: In/Out Cath Urine  Result Value Ref Range Status   Specimen Description   Final    IN/OUT CATH URINE Performed at [facility], [address] [place], [place] [address]    Special Requests   Final    NONE Performed at [facility], [address] [place],  [address]    Culture (A)  Final    >=100,000 COLONIES/mL ESCHERICHIA COLI SUSCEPTIBILITIES TO FOLLOW  Performed at [facility], [place] [address] [place], [place] [address]    Report Status PENDING  Incomplete  SARS Coronavirus 2 by RT PCR (hospital order, performed in [facility] hospital lab) Nasopharyngeal Nasopharyngeal Swab     Status: None   Collection Time: 05/05/20 11:00 PM   Specimen: Nasopharyngeal Swab  Result Value Ref Range Status   SARS Coronavirus 2 NEGATIVE NEGATIVE Final    Comment: (NOTE) SARS-CoV-2 target nucleic acids are NOT DETECTED.  The SARS-CoV-2 RNA is generally detectable in upper and lower respiratory specimens during the acute phase of infection. The lowest concentration of SARS-CoV-2 viral copies this assay can detect is 250 copies / mL. A negative result does not preclude SARS-CoV-2 infection and should not be used as the sole basis for treatment or other patient management decisions.  A negative result may occur with improper specimen collection / handling, submission of specimen other than nasopharyngeal swab, presence of viral mutation(s) within the areas targeted by this assay, and inadequate number of viral copies (<250 copies / mL). A negative result must be combined with clinical observations, patient history, and epidemiological information.  Fact Sheet for Patients:   StrictlyIdeas.no  Fact Sheet for Healthcare  Providers: BankingDealers.co.za  This test is not yet approved or  cleared by the [place] FDA and has been authorized for detection and/or diagnosis of SARS-CoV-2 by FDA under an Emergency Use Authorization (EUA).  This EUA will remain in effect (meaning this test can be used) for the duration of the COVID-19 declaration under Section 564(b)(1) of the Act, 21 U.S.C. section 360bbb-3(b)(1), unless the authorization is terminated or revoked sooner.  Performed at [facility], [address], [place], [place] [address]      Scheduled Meds: . aspirin EC  81 mg Oral Daily  . budesonide (PULMICORT) nebulizer solution  0.5 mg Nebulization BID  . Chlorhexidine Gluconate Cloth  6 each Topical Daily  . dutasteride  0.5 mg Oral Daily  . enoxaparin (LOVENOX) injection  40 mg Subcutaneous Q24H  . feeding supplement (GLUCERNA SHAKE)  237 mL Oral TID BM  . gabapentin  300 mg Oral q morning - 10a  . gabapentin  600 mg Oral Q supper  . insulin aspart  0-15 Units Subcutaneous TID WC  . insulin aspart  0-5 Units Subcutaneous QHS  . ipratropium-albuterol  3 mL Nebulization TID  . levothyroxine  75 mcg Oral Q0600  . methylPREDNISolone (SOLU-MEDROL) injection  60 mg Intravenous Q12H  . metoprolol succinate  25 mg Oral Once  . [START ON 05/08/2020] metoprolol succinate  50 mg Oral Daily  . pravastatin  40 mg Oral Q M,W,F  . tamsulosin  0.8 mg Oral QPC supper   Continuous Infusions: . lactated ringers 100 mL/hr at 05/07/20 0850  . piperacillin-tazobactam (ZOSYN)  IV 3.375 g (05/07/20 0940)    Procedures/Studies: DG Chest Port 1 View  Result Date: 05/05/2020 CLINICAL DATA:  Cough and fever. EXAM: PORTABLE CHEST 1 VIEW COMPARISON:  Radiograph 03/04/2020.  CT 03/23/2020 FINDINGS: Left chest port remains in place, unchanged in position. Chronic hyperinflation and interstitial coarsening. No acute airspace disease. Pulmonary nodule on prior CT are not well seen by radiograph.  Right upper lobe pulmonary nodule is faintly visualized. Stable heart size and mediastinal contours. Aortic atherosclerosis. No pleural fluid or pneumothorax. No pulmonary edema. No acute osseous abnormalities are seen. IMPRESSION: 1. No acute abnormality. 2. Stable hyperinflation and interstitial coarsening. 3. Known pulmonary nodules are not well seen by radiograph,  right upper lobe nodules faintly visualized Electronically Signed   By: Keith Rake M.D.   On: 05/05/2020 23:54    Orson Eva, DO  [facility]  If 7PM-7AM, please contact night-coverage [email or web address] Password [email or web address] 05/07/2020, 3:41 PM   LOS: 1 day

## 2020-05-07 NOTE — Progress Notes (Signed)
   05/07/20 1439  Assess: MEWS Score  Temp 98 F (36.7 C)  BP (!) 150/85  Pulse Rate (!) 114  Resp 19  SpO2 95 %  O2 Device Nasal Cannula  O2 Flow Rate (L/min) 2 L/min  Assess: MEWS Score  MEWS Temp 0  MEWS Systolic 0  MEWS Pulse 2  MEWS RR 0  MEWS LOC 0  MEWS Score 2  MEWS Score Color Yellow  Assess: if the MEWS score is Yellow or Red  Were vital signs taken at a resting state? Yes  Focused Assessment No change from prior assessment  Early Detection of Sepsis Score *See Row Information* High  MEWS guidelines implemented *See Row Information* Yes  Treat  MEWS Interventions Escalated (See documentation below)  Pain Scale 0-10  Pain Score 0  Take Vital Signs  Increase Vital Sign Frequency  Yellow: Q 2hr X 2 then Q 4hr X 2, if remains yellow, continue Q 4hrs  Escalate  MEWS: Escalate Yellow: discuss with charge nurse/RN and consider discussing with provider and RRT  Notify: Charge Nurse/RN  Name of Charge Nurse/RN Notified Mable Fill, RN  Date Charge Nurse/RN Notified 05/07/20  Time Charge Nurse/RN Notified 1452

## 2020-05-08 ENCOUNTER — Telehealth: Payer: Self-pay

## 2020-05-08 LAB — GLUCOSE, CAPILLARY
Glucose-Capillary: 178 mg/dL — ABNORMAL HIGH (ref 70–99)
Glucose-Capillary: 270 mg/dL — ABNORMAL HIGH (ref 70–99)

## 2020-05-08 LAB — CBC
HCT: 39.8 % (ref 39.0–52.0)
Hemoglobin: 13.1 g/dL (ref 13.0–17.0)
MCH: 31.3 pg (ref 26.0–34.0)
MCHC: 32.9 g/dL (ref 30.0–36.0)
MCV: 95.2 fL (ref 80.0–100.0)
Platelets: 307 10*3/uL (ref 150–400)
RBC: 4.18 MIL/uL — ABNORMAL LOW (ref 4.22–5.81)
RDW: 16.1 % — ABNORMAL HIGH (ref 11.5–15.5)
WBC: 12.6 10*3/uL — ABNORMAL HIGH (ref 4.0–10.5)
nRBC: 0 % (ref 0.0–0.2)

## 2020-05-08 LAB — URINE CULTURE: Culture: >=100000 — AB

## 2020-05-08 LAB — MAGNESIUM: Magnesium: 2.1 mg/dL (ref 1.7–2.4)

## 2020-05-08 LAB — BASIC METABOLIC PANEL
Anion gap: 9 (ref 5–15)
BUN: 18 mg/dL (ref 8–23)
CO2: 27 mmol/L (ref 22–32)
Calcium: 9.4 mg/dL (ref 8.9–10.3)
Chloride: 101 mmol/L (ref 98–111)
Creatinine, Ser: 0.74 mg/dL (ref 0.61–1.24)
GFR, Estimated: >60 mL/min (ref >60)
Glucose, Bld: 220 mg/dL — ABNORMAL HIGH (ref 70–99)
Potassium: 3.7 mmol/L (ref 3.5–5.1)
Sodium: 137 mmol/L (ref 135–145)

## 2020-05-08 MED ORDER — METOPROLOL SUCCINATE ER 50 MG PO TB24: 50.0000 mg | ORAL_TABLET | Freq: Every day | ORAL | 1 refills | Status: DC

## 2020-05-08 MED ORDER — PREDNISONE 10 MG PO TABS: 60.0000 mg | ORAL_TABLET | Freq: Every day | ORAL | 0 refills | Status: DC

## 2020-05-08 MED ORDER — AMOXICILLIN 500 MG PO CAPS: 500.0000 mg | ORAL_CAPSULE | Freq: Three times a day (TID) | ORAL | 0 refills | Status: DC

## 2020-05-08 MED ORDER — AMOXICILLIN 250 MG PO CAPS: 500.0000 mg | ORAL_CAPSULE | Freq: Three times a day (TID) | ORAL | Status: DC

## 2020-05-08 MED ORDER — PREDNISONE 20 MG PO TABS: 60.0000 mg | ORAL_TABLET | Freq: Every day | ORAL | Status: DC

## 2020-05-08 NOTE — Telephone Encounter (Signed)
Transition Care Management Unsuccessful Follow-up Telephone Call  Date of discharge and from where:  05/08/2020 [facility]  Attempts:  1st Attempt  Reason for unsuccessful TCM follow-up call:  Left voice message

## 2020-05-08 NOTE — Discharge Summary (Signed)
Physician Discharge Summary  John [name] NIO:[id number] DOB: 25-Dec-1945 DOA: 05/05/2020  PCP: [name], FNP  Admit date: 05/05/2020 Discharge date: 05/08/2020  Admitted From: Home Disposition:  Home   Recommendations for Outpatient Follow-up:  1. Follow up with PCP in 1-2 weeks 2. Please obtain BMP/CBC in one week     Discharge Condition: Stable CODE STATUS: FULL Diet recommendation: Heart Healthy / Carb Modified   Brief/Interim Summary: 75 year old male with a history of peripheral arterial disease, COPD, OSA, diabetes mellitus type 2, hyperlipidemia, hypertension, hypothyroidism, right-sided non-small cell lung carcinoma, and chronic respiratory failure on 3 L as needed presenting with fever and shortness of breath that began around 5 PM on 05/05/2020. The patient states that he has had some chest congestion for the past several days. He had a virtual visit with his PCP on 05/03/2020, and he was prescribed azithromycin which he feels has helped his cough. Review of the records shows that the patient has chronic dyspnea on exertion which he states has not been any worse this past week. He denied any chest pain, hemoptysis, vomiting, diarrhea, abdominal pain, hematuria, hematochezia, melena. He has had some nausea. His spouse noted that the patient had some confusion, and he was "talking out of his head"on the evening of 05/05/2020. He had associated subjective fevers and chills with shortness of breath at that time. As result, the patient was brought to the hospital. Notably, the patient has a history of BPH and is tentatively scheduledfor TURPon 05/31/2020 with Dr. Nicolette Bang. He states that he has a chronic indwelling Foley catheter which was placed in the beginning of November 2021. He had his Foley catheter last changed on 02/17/2021. Notably, the patient also had a hospital admission from 03/03/2020 to 03/05/2020 with sepsis secondary to UTI. He was discharged  home with oral antibiotics. During that hospitalization, the patient was also discharged home with 3 L nasal cannula oxygen. In the emergency department, the patient was febrile up to 102.3 F with tachycardia in the 130s. He was hemodynamically stable. Oxygen saturation was 96 to 98% on 2 L. Notably, his first set of labs were felt to be spurious. Repeat labs did show some improvement in his BMP with potassium 3.3, sodium 135, serum creatinine 0.84. LFTs were unremarkable. Itsunclear if his initial CBC was also spurious. Nevertheless it showed WBC 11.7, hemoglobin 14.3, platelets 216,000. Lactic acid 2.1>>1.5 the patient was started IV Zosyn.  Discharge Diagnoses:  Severe sepsis -Secondary toCAUTI -Lactic acid peaked 2.1>>1.5 -Judicious IV fluids -Follow blood and urine cultures -sepsis physiology resolved  CAUTI--E.Coli -Foley catheter has been changed in the emergency department -Follow urine cultures -Continue empiric Zosyn for now -d/c home with amoxil x 5 more days  Acute on chronic respiratory failure with hypoxia -Patient was discharged home on 3 L nasal cannula on 03/05/2020 -He states that his oxygen saturation was in the mid to upper 90s at home on room air, so he has not been using it -Currently stable on 2.5 L nasal cannula with saturation 96-98% -personally reviewed CXR---chronic interstitial markings, no consolidation -personally reviewed EKG--sinus, no concerning STT chnages  COPD exacerbation -Patient is having increased short of breath and wheezing during my examination -Started IV steroids -Started duo nebs -Started Pulmicort -d/c home with prednisone taper  Tobacco abuse -The patient has approximately 70-pack-year history of tobacco -He continues to smoke 1 pack every 4 days -Cessation discussed  BPH -Continue Avodart and Flomax -Tentatively scheduled for TURP on 05/31/2020  Diabetes mellitus type  2 -03/20/2020 hemoglobin A1c  6.8 -Holding Metformin -NovoLog sliding scale  Essential hypertension -Continue metoprolol succinate--increase to 50 mg -Holding amlodipine and benicar -restart clonidine  Peripheral arterial disease -Patient previously had right femoral endarterectomy scheduled with Dr. Monica Martinez in November 2021 -This has been delayed secondary to his numerous UTIs and will be rescheduled after his TURP -Continue aspirin  Hypokalemia -Replete -Check magnesium  Hypothyroidism -Continue Synthroid  Hyperlipidemia -Continue statin  Right NSCLC--stage IIIa -Follows Dr. Delton Coombes -Appears to be in remission, but continues to follow-up regarding his pulmonary nodules -s/pChemoradiation with weekly carboplatin and paclitaxel from 06/25/2016 to 08/20/2016. -s/pConsolidation durvalumab from 09/26/2016 to 09/25/2017.   Discharge Instructions   Allergies as of 05/08/2020      Reactions   Jardiance [empagliflozin] Other (See Comments)   FEELS SLUGGISH, TIRED   Lopressor [metoprolol] Other (See Comments)   Fatigue      Medication List    STOP taking these medications   amLODipine 5 MG tablet Commonly known as: NORVASC   azithromycin 250 MG tablet Commonly known as: Zithromax Z-Pak   doxycycline 100 MG tablet Commonly known as: VIBRA-TABS   olmesartan 40 MG tablet Commonly known as: Benicar     TAKE these medications   albuterol 108 (90 Base) MCG/ACT inhaler Commonly known as: VENTOLIN HFA Inhale 2 puffs into the lungs every 4 (four) hours as needed for wheezing or shortness of breath.   amoxicillin 500 MG capsule Commonly known as: AMOXIL Take 1 capsule (500 mg total) by mouth every 8 (eight) hours.   aspirin EC 81 MG tablet Take 81 mg by mouth daily.   cloNIDine 0.1 MG tablet Commonly known as: CATAPRES TAKE 1 TABLET (0.1 MG TOTAL) BY MOUTH 3 (THREE) TIMES DAILY.   dutasteride 0.5 MG capsule Commonly known as: AVODART TAKE 1 CAPSULE BY MOUTH EVERY  DAY What changed: how much to take   furosemide 20 MG tablet Commonly known as: LASIX Take 1 tablet (20 mg total) by mouth 2 (two) times daily. What changed: when to take this   gabapentin 300 MG capsule Commonly known as: NEURONTIN Take 1 capsule (300 mg total) by mouth 3 (three) times daily. What changed:   how much to take  when to take this  additional instructions   levothyroxine 75 MCG tablet Commonly known as: SYNTHROID Take 1 tablet (75 mcg total) by mouth daily.   linaclotide 72 MCG capsule Commonly known as: Linzess Take 1 capsule (72 mcg total) by mouth daily before breakfast. What changed:   when to take this  reasons to take this   meloxicam 7.5 MG tablet Commonly known as: MOBIC Take 1 tablet (7.5 mg total) by mouth daily as needed for pain (back pain).   metFORMIN 500 MG 24 hr tablet Commonly known as: GLUCOPHAGE-XR Take 1 tablet (500 mg total) by mouth daily with breakfast.   metoprolol succinate 50 MG 24 hr tablet Commonly known as: TOPROL-XL Take 1 tablet (50 mg total) by mouth daily. Take with or immediately following a meal. Start taking on: May 09, 2020 What changed:   medication strength  how much to take  additional instructions   mupirocin ointment 2 % Commonly known as: BACTROBAN Apply 1 application topically 3 (three) times daily as needed (wound care.).   ondansetron 4 MG disintegrating tablet Commonly known as: Zofran ODT Take 1 tablet (4 mg total) by mouth every 8 (eight) hours as needed for nausea or vomiting.   OneTouch Delica Lancets 88K Misc Use to  test blood sugars daily as directed. DX: E11.9   OneTouch Verio Reflect w/Device Kit Use to test blood sugars daily as directed. DX: E11.9   OneTouch Verio test strip Generic drug: glucose blood Use to test blood sugars daily as directed. DX: E11.9   pravastatin 40 MG tablet Commonly known as: PRAVACHOL Take 1 tablet (40 mg total) by mouth every Monday, Wednesday,  and Friday. At night   predniSONE 10 MG tablet Commonly known as: DELTASONE Take 6 tablets (60 mg total) by mouth daily with breakfast. And decrease by one tablet daily Start taking on: May 09, 2020   pseudoephedrine-guaifenesin 60-600 MG 12 hr tablet Commonly known as: Mucinex D Take 1 tablet by mouth every 12 (twelve) hours for 10 days. As needed for congestion   Rybelsus 7 MG Tabs Generic drug: Semaglutide Take 7 mg by mouth daily.   tamsulosin 0.4 MG Caps capsule Commonly known as: FLOMAX Take 2 capsules (0.8 mg total) by mouth daily after supper.   Trelegy Ellipta 100-62.5-25 MCG/INH Aepb Generic drug: Fluticasone-Umeclidin-Vilant Inhale 1 puff into the lungs daily.       Allergies  Allergen Reactions  . Jardiance [Empagliflozin] Other (See Comments)    FEELS SLUGGISH, TIRED  . Lopressor [Metoprolol] Other (See Comments)    Fatigue    Consultations:  none   Procedures/Studies: DG Chest Port 1 View  Result Date: 05/05/2020 CLINICAL DATA:  Cough and fever. EXAM: PORTABLE CHEST 1 VIEW COMPARISON:  Radiograph 03/04/2020.  CT 03/23/2020 FINDINGS: Left chest port remains in place, unchanged in position. Chronic hyperinflation and interstitial coarsening. No acute airspace disease. Pulmonary nodule on prior CT are not well seen by radiograph. Right upper lobe pulmonary nodule is faintly visualized. Stable heart size and mediastinal contours. Aortic atherosclerosis. No pleural fluid or pneumothorax. No pulmonary edema. No acute osseous abnormalities are seen. IMPRESSION: 1. No acute abnormality. 2. Stable hyperinflation and interstitial coarsening. 3. Known pulmonary nodules are not well seen by radiograph, right upper lobe nodules faintly visualized Electronically Signed   By: Keith Rake M.D.   On: 05/05/2020 23:54        Discharge Exam: Vitals:   05/08/20 0501 05/08/20 0854  BP: 123/64   Pulse: (!) 102   Resp: 20   Temp: 97.7 F (36.5 C)   SpO2: 97%  96%   Vitals:   05/07/20 2241 05/08/20 0207 05/08/20 0501 05/08/20 0854  BP: (!) 155/74 (!) 157/95 123/64   Pulse: (!) 113 (!) 110 (!) 102   Resp: 20 20 20    Temp: 98.5 F (36.9 C) 97.8 F (36.6 C) 97.7 F (36.5 C)   TempSrc: Oral Oral Oral   SpO2: 96% 97% 97% 96%  Weight:      Height:        General: Pt is alert, awake, not in acute distress Cardiovascular: RRR, S1/S2 +, no rubs, no gallops Respiratory: CTA bilaterally, no wheezing, no rhonchi Abdominal: Soft, NT, ND, bowel sounds + Extremities: no edema, no cyanosis   The results of significant diagnostics from this hospitalization (including imaging, microbiology, ancillary and laboratory) are listed below for reference.    Significant Diagnostic Studies: DG Chest Port 1 View  Result Date: 05/05/2020 CLINICAL DATA:  Cough and fever. EXAM: PORTABLE CHEST 1 VIEW COMPARISON:  Radiograph 03/04/2020.  CT 03/23/2020 FINDINGS: Left chest port remains in place, unchanged in position. Chronic hyperinflation and interstitial coarsening. No acute airspace disease. Pulmonary nodule on prior CT are not well seen by radiograph. Right upper lobe  pulmonary nodule is faintly visualized. Stable heart size and mediastinal contours. Aortic atherosclerosis. No pleural fluid or pneumothorax. No pulmonary edema. No acute osseous abnormalities are seen. IMPRESSION: 1. No acute abnormality. 2. Stable hyperinflation and interstitial coarsening. 3. Known pulmonary nodules are not well seen by radiograph, right upper lobe nodules faintly visualized Electronically Signed   By: Keith Rake M.D.   On: 05/05/2020 23:54     Microbiology: Recent Results (from the past 240 hour(s))  Novel Coronavirus, NAA ([facility])     Status: None   Collection Time: 05/03/20  1:57 PM   Specimen: Nasopharyngeal(NP) swabs in vial transport medium  Result Value Ref Range Status   SARS-CoV-2, NAA Not Detected Not Detected Final    Comment: This nucleic acid amplification  test was developed and its performance characteristics determined by [employer]. Nucleic acid amplification tests include RT-PCR and TMA. This test has not been FDA cleared or approved. This test has been authorized by FDA under an Emergency Use Authorization (EUA). This test is only authorized for the duration of time the declaration that circumstances exist justifying the authorization of the emergency use of in vitro diagnostic tests for detection of SARS-CoV-2 virus and/or diagnosis of COVID-19 infection under section 564(b)(1) of the Act, 21 U.S.C. 397QBH-4(L) (1), unless the authorization is terminated or revoked sooner. When diagnostic testing is negative, the possibility of a false negative result should be considered in the context of a patient's recent exposures and the presence of clinical signs and symptoms consistent with COVID-19. An individual without symptoms of COVID-19 and who is not shedding SARS-CoV-2 virus wo uld expect to have a negative (not detected) result in this assay.   SARS-COV-2, NAA 2 DAY Kayonna Lawniczak     Status: None   Collection Time: 05/03/20  1:57 PM  Result Value Ref Range Status   SARS-CoV-2, NAA 2 DAY Etan Vasudevan Performed  Final  Blood culture (routine single)     Status: None (Preliminary result)   Collection Time: 05/05/20 11:00 PM   Specimen: BLOOD RIGHT HAND  Result Value Ref Range Status   Specimen Description BLOOD RIGHT HAND  Final   Special Requests   Final    BOTTLES DRAWN AEROBIC AND ANAEROBIC Blood Culture adequate volume   Culture   Final    NO GROWTH 3 DAYS Performed at [facility], [address], [place], [place] [address]    Report Status PENDING  Incomplete  Urine culture     Status: Abnormal   Collection Time: 05/05/20 11:00 PM   Specimen: In/Out Cath Urine  Result Value Ref Range Status   Specimen Description   Final    IN/OUT CATH URINE Performed at [facility], [address], [place], [place] [address]    Special Requests    Final    NONE Performed at [facility], [address], [place], [place] [address]    Culture >=100,000 COLONIES/mL ESCHERICHIA COLI (A)  Final   Report Status 05/08/2020 FINAL  Final   Organism ID, Bacteria ESCHERICHIA COLI (A)  Final      Susceptibility   Escherichia coli - MIC*    AMPICILLIN 4 SENSITIVE Sensitive     CEFAZOLIN <=4 SENSITIVE Sensitive     CEFEPIME <=0.12 SENSITIVE Sensitive     CEFTRIAXONE <=0.25 SENSITIVE Sensitive     CIPROFLOXACIN <=0.25 SENSITIVE Sensitive     GENTAMICIN <=1 SENSITIVE Sensitive     IMIPENEM <=0.25 SENSITIVE Sensitive     NITROFURANTOIN <=16 SENSITIVE Sensitive  TRIMETH/SULFA <=20 SENSITIVE Sensitive     AMPICILLIN/SULBACTAM <=2 SENSITIVE Sensitive     PIP/TAZO <=4 SENSITIVE Sensitive     * >=100,000 COLONIES/mL ESCHERICHIA COLI  SARS Coronavirus 2 by RT PCR (hospital order, performed in [facility] hospital lab) Nasopharyngeal Nasopharyngeal Swab     Status: None   Collection Time: 05/05/20 11:00 PM   Specimen: Nasopharyngeal Swab  Result Value Ref Range Status   SARS Coronavirus 2 NEGATIVE NEGATIVE Final    Comment: (NOTE) SARS-CoV-2 target nucleic acids are NOT DETECTED.  The SARS-CoV-2 RNA is generally detectable in upper and lower respiratory specimens during the acute phase of infection. The lowest concentration of SARS-CoV-2 viral copies this assay can detect is 250 copies / mL. A negative result does not preclude SARS-CoV-2 infection and should not be used as the sole basis for treatment or other patient management decisions.  A negative result may occur with improper specimen collection / handling, submission of specimen other than nasopharyngeal swab, presence of viral mutation(s) within the areas targeted by this assay, and inadequate number of viral copies (<250 copies / mL). A negative result must be combined with clinical observations, patient history, and epidemiological information.  Fact Sheet for Patients:    StrictlyIdeas.no  Fact Sheet for Healthcare Providers: BankingDealers.co.za  This test is not yet approved or  cleared by the [place] FDA and has been authorized for detection and/or diagnosis of SARS-CoV-2 by FDA under an Emergency Use Authorization (EUA).  This EUA will remain in effect (meaning this test can be used) for the duration of the COVID-19 declaration under Section 564(b)(1) of the Act, 21 U.S.C. section 360bbb-3(b)(1), unless the authorization is terminated or revoked sooner.  Performed at [facility], [address] [place], [place] [address]      Labs: Basic Metabolic Panel: Recent Labs  Lab 05/05/20 2300 05/06/20 0033 05/06/20 0547 05/07/20 0953 05/08/20 0514  NA 141 135 137 137 137  K 1.5* 3.3* 4.0 3.4* 3.7  CL 126* 104 106 104 101  CO2 13* 23 24 25 27   GLUCOSE 88 159* 129* 148* 220*  BUN 10 16 13 12 18   CREATININE 0.31* 0.84 0.78 0.70 0.74  CALCIUM 4.1* 8.5* 8.8* 9.3 9.4  MG  --  1.7 1.8  --  2.1  PHOS  --   --  2.8  --   --    Liver Function Tests: Recent Labs  Lab 05/05/20 2300 05/06/20 0033 05/06/20 0547  AST 11* 20 18  ALT 9 17 15   ALKPHOS 41 80 72  BILITOT 0.3 0.5 0.6  PROT 3.3* 6.5 6.5  ALBUMIN 1.7* 3.2* 3.1*   No results for input(s): LIPASE, AMYLASE in the last 168 hours. No results for input(s): AMMONIA in the last 168 hours. CBC: Recent Labs  Lab 05/05/20 2300 05/06/20 0547 05/07/20 0953 05/08/20 0514  WBC 11.7* 16.7* 12.5* 12.6*  NEUTROABS 10.8*  --   --   --   HGB 14.3 12.8* 13.3 13.1  HCT 43.4 39.1 40.9 39.8  MCV 93.9 94.9 95.1 95.2  PLT 316 296 284 307   Cardiac Enzymes: No results for input(s): CKTOTAL, CKMB, CKMBINDEX, TROPONINI in the last 168 hours. BNP: Invalid input(s): POCBNP CBG: Recent Labs  Lab 05/07/20 0839 05/07/20 1147 05/07/20 1644 05/07/20 2045 05/08/20 0803  GLUCAP 125* 204* 223* 198* 178*    Time coordinating discharge:  36  minutes  Signed:  Orson Eva, DO [facility] Pager: [phone] 05/08/2020, 10:56 AM

## 2020-05-08 NOTE — Telephone Encounter (Signed)
Contact Date: 05/08/2020 Contacted By:  Felicity Coyer LPN  Transition Care Management Follow-up Telephone Call  Date of discharge and from where: 05/08/2020 [facility]  Discharge Diagnosis:  Severe Sepsis  How have you been since you were released from the hospital? Patient reports he is weak but does feel better than when admitted.  Any questions or concerns? No   Items Reviewed:  Did the pt receive and understand the discharge instructions provided? Yes   Medications obtained and verified? Yes   Any new allergies since your discharge? No   Dietary orders reviewed? Yes  Do you have support at home? Yes   Discontinued Medications Amlodipine, Olmesarta  New Medications Added  Prednisone, Amoxicillin, Metoprolol  Current Medication List Allergies as of 05/08/2020      Reactions   Jardiance [empagliflozin] Other (See Comments)   FEELS SLUGGISH, TIRED   Lopressor [metoprolol] Other (See Comments)   Fatigue      Medication List       Accurate as of May 08, 2020  3:01 PM. If you have any questions, ask your nurse or doctor.        albuterol 108 (90 Base) MCG/ACT inhaler Commonly known as: VENTOLIN HFA Inhale 2 puffs into the lungs every 4 (four) hours as needed for wheezing or shortness of breath.   amoxicillin 500 MG capsule Commonly known as: AMOXIL Take 1 capsule (500 mg total) by mouth every 8 (eight) hours.   aspirin EC 81 MG tablet Take 81 mg by mouth daily.   cloNIDine 0.1 MG tablet Commonly known as: CATAPRES TAKE 1 TABLET (0.1 MG TOTAL) BY MOUTH 3 (THREE) TIMES DAILY.   dutasteride 0.5 MG capsule Commonly known as: AVODART TAKE 1 CAPSULE BY MOUTH EVERY DAY What changed: how much to take   furosemide 20 MG tablet Commonly known as: LASIX Take 1 tablet (20 mg total) by mouth 2 (two) times daily. What changed: when to take this   gabapentin 300 MG capsule Commonly known as: NEURONTIN Take 1 capsule (300 mg total) by mouth 3 (three) times  daily. What changed:   how much to take  when to take this  additional instructions   levothyroxine 75 MCG tablet Commonly known as: SYNTHROID Take 1 tablet (75 mcg total) by mouth daily.   linaclotide 72 MCG capsule Commonly known as: Linzess Take 1 capsule (72 mcg total) by mouth daily before breakfast. What changed:   when to take this  reasons to take this   meloxicam 7.5 MG tablet Commonly known as: MOBIC Take 1 tablet (7.5 mg total) by mouth daily as needed for pain (back pain).   metFORMIN 500 MG 24 hr tablet Commonly known as: GLUCOPHAGE-XR Take 1 tablet (500 mg total) by mouth daily with breakfast.   metoprolol succinate 50 MG 24 hr tablet Commonly known as: TOPROL-XL Take 1 tablet (50 mg total) by mouth daily. Take with or immediately following a meal. Start taking on: May 09, 2020   mupirocin ointment 2 % Commonly known as: BACTROBAN Apply 1 application topically 3 (three) times daily as needed (wound care.).   ondansetron 4 MG disintegrating tablet Commonly known as: Zofran ODT Take 1 tablet (4 mg total) by mouth every 8 (eight) hours as needed for nausea or vomiting.   OneTouch Delica Lancets 82N Misc Use to test blood sugars daily as directed. DX: E11.9   OneTouch Verio Reflect w/Device Kit Use to test blood sugars daily as directed. DX: E11.9   OneTouch Verio test  strip Generic drug: glucose blood Use to test blood sugars daily as directed. DX: E11.9   pravastatin 40 MG tablet Commonly known as: PRAVACHOL Take 1 tablet (40 mg total) by mouth every Monday, Wednesday, and Friday. At night   predniSONE 10 MG tablet Commonly known as: DELTASONE Take 6 tablets (60 mg total) by mouth daily with breakfast. And decrease by one tablet daily Start taking on: May 09, 2020   pseudoephedrine-guaifenesin 60-600 MG 12 hr tablet Commonly known as: Mucinex D Take 1 tablet by mouth every 12 (twelve) hours for 10 days. As needed for congestion    Rybelsus 7 MG Tabs Generic drug: Semaglutide Take 7 mg by mouth daily.   tamsulosin 0.4 MG Caps capsule Commonly known as: FLOMAX Take 2 capsules (0.8 mg total) by mouth daily after supper.   Trelegy Ellipta 100-62.5-25 MCG/INH Aepb Generic drug: Fluticasone-Umeclidin-Vilant Inhale 1 puff into the lungs daily.        Home Care and Equipment/Supplies: Were home health services ordered? no If so, what is the name of the agency? Not applicable  Has the agency set up a time to come to the patient's home? not applicable Were any new equipment or medical supplies ordered?  Yes: oxygen from [facility] What is the name of the medical supply agency? [facility] Were you able to get the supplies/equipment? yes Do you have any questions related to the use of the equipment or supplies? No  Functional Questionnaire: (I = Independent and D = Dependent) ADLs: Dependent  Bathing/Dressing- Independent  Meal Prep- Dependent  Eating- Independent  Maintaining continence- Dependent  Transferring/Ambulation- Independent  Managing Meds- Independent  Follow up appointments reviewed:   PCP Hospital f/u appt confirmed? Yes  Scheduled to see Christy Hawks on 05/24/2020 @ 11:40 am  Specialist Hospital f/u appt confirmed? No    Are transportation arrangements needed? No   If their condition worsens, is the pt aware to call PCP or go to the Emergency Dept.? Yes  Was the patient provided with contact information for the PCP's office or ED? Yes  Was to pt encouraged to call back with questions or concerns? Yes  

## 2020-05-08 NOTE — Plan of Care (Signed)
  Problem: Acute Rehab PT Goals(only PT should resolve) Goal: Patient Will Transfer Sit To/From Stand Outcome: Progressing Flowsheets (Taken 05/08/2020 1131) Patient will transfer sit to/from stand: with min guard assist Goal: Pt Will Transfer Bed To Chair/Chair To Bed Outcome: Progressing Flowsheets (Taken 05/08/2020 1131) Pt will Transfer Bed to Chair/Chair to Bed: min guard assist Goal: Pt Will Ambulate Outcome: Progressing Flowsheets (Taken 05/08/2020 1131) Pt will Ambulate:  > 125 feet  with min guard assist  with rolling walker   Tori Myeisha Kruser PT, DPT 05/08/20, 11:31 AM [phone]

## 2020-05-08 NOTE — Evaluation (Signed)
Physical Therapy Evaluation Patient Details Name: John [name] MRN: [id number] DOB: [birth year] Today's Date: 05/08/2020   History of Present Illness  John [name] is a 75 y.o. male with medical history of R lung adenocarcinoma, non-small cell lung cancer, diabetes, COPD, depression, HTN, hyperlipidemia, PAD and recent peripheral angiography where he was found with severe arterial disease on his right lower extremity, Foley catheter since 02/06/20 who presents to the ED with sudden onset of fever and chills which started around 5:30 PM 1/29 associated with shortness of breath.  Clinical Impression  Pt admitted with above diagnosis. PTA, pt independent with mobility with occasional SPC use and PRN supplemental O2 use. Pt currently desats with bed mobility and while changing socks when seated EOB to 86% on RA and HR up to 130. Pt performs standing marching with IV pole and min A, posterior lean and shakiness with mobility requiring assist, HR 131 max noted so returned to sitting. Pt desats to 86% on RA and Spo2 92% on 3L O2, educated on pursed lip breathing. Pt currently with functional limitations due to the deficits listed below (see PT Problem List). Pt will benefit from skilled PT to increase their independence and safety with mobility to allow discharge to the venue listed below.       Follow Up Recommendations Home health PT    Equipment Recommendations  Rolling walker with 5" wheels    Recommendations for Other Services       Precautions / Restrictions Precautions Precautions: Fall Precaution Comments: monitor O2 and SpO2 Restrictions Weight Bearing Restrictions: No      Mobility  Bed Mobility Overal bed mobility: Modified Independent    General bed mobility comments: labored movement, cues to avoid val salva maneuver and for pursed lip breathing with mobility, HOB elevated    Transfers Overall transfer level: Needs assistance Equipment used: 1 person hand held  assist Transfers: Sit to/from Stand Sit to Stand: Min assist;Min guard  General transfer comment: min A to steady, posterior lean with weight through heels and BLE braced on EOB improving with reps to min G, HR up to 131 max noted with standing at EOB  Ambulation/Gait  Assistive device: IV Pole  General Gait Details: Pt completes standing marching at bedside with HR 131 max and SpO2 92% on 3L, further ambulation not attempted due to elevated HR standing at bedside  Stairs            Wheelchair Mobility    Modified Rankin (Stroke Patients Only)       Balance Overall balance assessment: Needs assistance Sitting-balance support: Feet supported;No upper extremity supported Sitting balance-Leahy Scale: Good Sitting balance - Comments: seated EOB   Standing balance support: During functional activity;Single extremity supported Standing balance-Leahy Scale: Poor Standing balance comment: reliant on UE support        Pertinent Vitals/Pain Pain Assessment: No/denies pain    Home Living Family/patient expects to be discharged to:: Private residence Living Arrangements: Spouse/significant other Available Help at Discharge: Family;Available PRN/intermittently Type of Home: House Home Access: Stairs to enter Entrance Stairs-Rails: Psychiatric nurse of Steps: 2 Home Layout: One level Home Equipment: Cane - single point      Prior Function Level of Independence: Independent         Comments: Pt reports independent with ambulation in home and community, occasionally uses SPC when needed, independent with ADLs, driving, denies falls. Pt reports spouse normally dons his socks and he uses supplemental O2 PRN.     Hand Dominance  Extremity/Trunk Assessment   Upper Extremity Assessment Upper Extremity Assessment: Overall WFL for tasks assessed    Lower Extremity Assessment Lower Extremity Assessment: Overall WFL for tasks assessed (AROM WNL,  strength 4/5, denies numbness/tingling throughout)    Cervical / Trunk Assessment Cervical / Trunk Assessment: Normal  Communication   Communication: No difficulties  Cognition Arousal/Alertness: Awake/alert Behavior During Therapy: WFL for tasks assessed/performed Overall Cognitive Status: Within Functional Limits for tasks assessed       General Comments General comments (skin integrity, edema, etc.): Pt desats to 86% on RA with bed mobility and while donning grip socks despite cues for pursed lip breathing, return 3L with SpO2 92% with activity    Exercises     Assessment/Plan    PT Assessment Patient needs continued PT services  PT Problem List Decreased strength;Decreased activity tolerance;Decreased balance;Decreased knowledge of use of DME;Decreased safety awareness;Cardiopulmonary status limiting activity       PT Treatment Interventions DME instruction;Gait training;Stair training;Functional mobility training;Therapeutic activities;Therapeutic exercise;Balance training;Neuromuscular re-education;Patient/family education    PT Goals (Current goals can be found in the Care Plan section)  Acute Rehab PT Goals Patient Stated Goal: home with HHPT PT Goal Formulation: With patient/family Time For Goal Achievement: 05/22/20 Potential to Achieve Goals: Good    Frequency Min 3X/week   Barriers to discharge        Co-evaluation               AM-PAC PT "6 Clicks" Mobility  Outcome Measure Help needed turning from your back to your side while in a flat bed without using bedrails?: None Help needed moving from lying on your back to sitting on the side of a flat bed without using bedrails?: None Help needed moving to and from a bed to a chair (including a wheelchair)?: A Little Help needed standing up from a chair using your arms (e.g., wheelchair or bedside chair)?: A Little Help needed to walk in hospital room?: A Little Help needed climbing 3-5 steps with a  railing? : A Little 6 Click Score: 20    End of Session Equipment Utilized During Treatment: Gait belt;Oxygen Activity Tolerance: Patient tolerated treatment well Patient left: in bed;with call bell/phone within reach;with nursing/sitter in room;with family/visitor present Nurse Communication: Mobility status;Other (comment) (O2) PT Visit Diagnosis: Unsteadiness on feet (R26.81);Other abnormalities of gait and mobility (R26.89)    Time: 1694-5038 PT Time Calculation (min) (ACUTE ONLY): 26 min   Charges:   PT Evaluation $PT Eval Low Complexity: 1 Low PT Treatments $Therapeutic Activity: 8-22 mins         Tori Osbaldo Mark PT, DPT 05/08/20, 11:28 AM

## 2020-05-08 NOTE — TOC Transition Note (Signed)
Transition of Care [facility]) - CM/SW Discharge Note   Patient Details  Name: John [name] MRN: [id number] Date of Birth: Oct 17, 1945  Transition of Care [facility]) CM/SW Contact:  Shade Flood, LCSW Phone Number: 05/08/2020, 12:01 PM   Clinical Narrative:     Pt stable for dc. PT recommending HH PT and RW. Spoke with pt who is agreeable to [facility] and RW. Provided provider options to pt and referred to [facility] at pt request. Also arranged for portable O2 tank. Pt has a concentrator at home but stated that he did not have any portables. This LCSW asked [facility] to make a service visit to pt's home to provide education on pt's setup and using O2 with his CPAP as pt states he does not know how to use it.   There are no other TOC needs for dc.  Expected Discharge Plan: [place] Barriers to Discharge: Barriers Resolved   Patient Goals and CMS Choice Patient states their goals for this hospitalization and ongoing recovery are:: go home CMS Medicare.gov Compare Post Acute Care list provided to:: Patient Choice offered to / list presented to : Patient  Expected Discharge Plan and Services Expected Discharge Plan: [place] In-house Referral: Clinical Social Work   Post Acute Care Choice: [place] Living arrangements for the past 2 months: [place] Expected Discharge Date: 05/08/20               DME Arranged: Gilford Rile rolling DME Agency: [facility] Date DME Agency Contacted: 05/08/20   Representative spoke with at DME Agency: Barbaraann Rondo coverage [place] Arranged: PT,RN [place]: [place] ([place]) Date [place]: 05/08/20   Representative spoke with at [place]: [place]an Basta  Prior Living Arrangements/Services Living arrangements for the past 2 months: [place] with:: Significant Other Patient language and need for interpreter reviewed:: Yes Do you feel safe going back to  the place where you live?: Yes      Need for Family Participation in Patient Care: Yes (Comment) Care giver support system in place?: Yes (comment)   Criminal Activity/Legal Involvement Pertinent to Current Situation/Hospitalization: No - Comment as needed  Activities of Daily Living Home Assistive Devices/Equipment: Cane (specify quad or straight),Dentures (specify type),Eyeglasses ADL Screening (condition at time of admission) Patient's cognitive ability adequate to safely complete daily activities?: Yes Is the patient deaf or have difficulty hearing?: Yes Does the patient have difficulty seeing, even when wearing glasses/contacts?: No Does the patient have difficulty concentrating, remembering, or making decisions?: No Patient able to express need for assistance with ADLs?: Yes Does the patient have difficulty dressing or bathing?: No Independently performs ADLs?: Yes (appropriate for developmental age) Does the patient have difficulty walking or climbing stairs?: Yes Weakness of Legs: Both Weakness of Arms/Hands: None  Permission Sought/Granted Permission sought to share information with : Chartered certified accountant granted to share information with : Yes, Verbal Permission Granted     Permission granted to share info w AGENCY: HH and DME agencies        Emotional Assessment Appearance:: Appears stated age Attitude/Demeanor/Rapport: Engaged Affect (typically observed): Pleasant Orientation: : Oriented to Self,Oriented to Place,Oriented to  Time,Oriented to Situation Alcohol / Substance Use: Not Applicable Psych Involvement: No (comment)  Admission diagnosis:  Hypokalemia [E87.6] UTI (urinary tract infection) [N39.0] Hypoxia [R09.02] Febrile urinary tract infection [N39.0] Patient Active Problem List   Diagnosis Date Noted  . Sepsis due to Escherichia  coli (E. coli) ([place]) 05/07/2020  . Hypokalemia 05/06/2020  . Hyperglycemia due to diabetes mellitus  ([place]) 05/06/2020  . Hypoalbuminemia 05/06/2020  . Prolonged QT interval 05/06/2020  . Lactic acidosis 05/06/2020  . Bronchitis due to tobacco use 05/06/2020  . Acute respiratory failure with hypoxia ([place]) 05/06/2020  . Severe sepsis ([place]) 05/06/2020  . Acute on chronic respiratory failure with hypoxia ([place]) 05/06/2020  . COPD with acute exacerbation ([place]) 05/06/2020  . Tobacco abuse 05/06/2020  . Urinary retention 03/09/2020  . Acute cystitis with hematuria   . Complicated UTI (urinary tract infection)   . Dyspnea   . Hypoxia   . Sepsis secondary to UTI ([place]) 03/03/2020  . COPD GOLD III/ group D still smoking  10/24/2019  . PAD (peripheral artery disease) ([place]) 09/13/2019  . Obstructive sleep apnea treated with continuous positive airway pressure (CPAP) 05/31/2018  . Diabetic neuropathy ([place]) 12/29/2017  . Cigarette smoker 12/29/2017  . Hyperlipidemia associated with type 2 diabetes mellitus ([place]) 12/29/2017  . Primary insomnia 12/29/2017  . Primary osteoarthritis involving multiple joints 12/29/2017  . OSA (obstructive sleep apnea) 12/10/2017  . Hypothyroidism 06/09/2017  . Joint pain 08/05/2016  . Bilateral hand swelling 08/05/2016  . Hyponatremia 08/05/2016  . Pulmonary nodule, left 07/16/2016  . CHF (congestive heart failure) ([place]) 07/09/2016  . DM (diabetes mellitus) ([place]) 07/09/2016  . Non-small cell lung cancer, right ([place]) 04/18/2016  . Essential hypertension 02/01/2016  . COPD (chronic obstructive pulmonary disease) ([place]) 02/01/2016  . BPH with urinary obstruction 02/01/2016   PCP:  [name], FNP Pharmacy:   [id number] - [place], [place] [address] Phone: [phone] Fax: [phone]     Social Determinants of Health (SDOH) Interventions    Readmission Risk Interventions Readmission Risk Prevention Plan 05/08/2020  Transportation Screening Complete  Home Care Screening Complete   Medication Review (RN CM) Complete  Some recent data might be hidden     Final next level of care: Home w Home Health Services Barriers to Discharge: Barriers Resolved   Patient Goals and CMS Choice Patient states their goals for this hospitalization and ongoing recovery are:: go home CMS Medicare.gov Compare Post Acute Care list provided to:: Patient Choice offered to / list presented to : Patient  Discharge Placement                       Discharge Plan and Services In-house Referral: Clinical Social Work   Post Acute Care Choice: Home Health,Durable Medical Equipment          DME Arranged: Gilford Rile rolling DME Agency: [facility] Date DME Agency Contacted: 05/08/20   Representative spoke with at DME Agency: Barbaraann Rondo coverage [place] Arranged: PT,RN [place]: [place] ([place]) Date [place]: 05/08/20   Representative spoke with at [place]: [place] ([place]) Interventions     Readmission Risk Interventions Readmission Risk Prevention Plan 05/08/2020  Transportation Screening Complete  Home Care Screening Complete  Medication Review (RN CM) Complete  Some recent data might be hidden

## 2020-05-08 NOTE — Progress Notes (Signed)
Inpatient Diabetes Program Recommendations  AACE/ADA: New Consensus Statement on Inpatient Glycemic Control   Target Ranges:  Prepandial:   less than 140 mg/dL      Peak postprandial:   less than 180 mg/dL (1-2 hours)      Critically ill patients:  140 - 180 mg/dL   Results for John [name], John [name] (MRN [id number]) as of 05/08/2020 08:17  Ref. Range 05/07/2020 08:39 05/07/2020 11:47 05/07/2020 16:44 05/07/2020 20:45 05/08/2020 08:03  Glucose-Capillary Latest Ref Range: 70 - 99 mg/dL 125 (H) 204 (H) 223 (H) 198 (H) 178 (H)   Review of Glycemic Control  Diabetes history: DM2 Outpatient Diabetes medications: Metformin XR 500 mg QAM, Rybelsus 7 mg daily Current orders for Inpatient glycemic control: Novolog 0-15 units TID with meals, Novolog 0-5 units QHS; Solumedrol 60 mg Q12H  Inpatient Diabetes Program Recommendations:    Insulin: If steroids are continued, please consider ordering Novolog 3 units TID with meals for meal coverage if patient eats at least 50% of meals.  Thanks, Barnie Alderman, RN, MSN, CDE Diabetes Coordinator Inpatient Diabetes Program [phone] (Team Pager from 8am to 5pm)

## 2020-05-08 NOTE — Progress Notes (Signed)
Discharge instructions reviewed with patient. AVS given. Verbalized understanding of instructions, medications, follow-up. Home O2 already at home, has portable O2 tank and verbalizes/demonstrates correct use. Walker delivered by [facility] prior to discharge. HH arranged with [facility] per TOC. Foley catheter switched over to leg bag at patient request prior to discharge. Demonstrates correct foley care for home. Gloves and extra leg bag provided. IV sites removed, both within normal limits. Patient in stable condition for discharge home.

## 2020-05-09 ENCOUNTER — Other Ambulatory Visit: Payer: Self-pay

## 2020-05-09 ENCOUNTER — Ambulatory Visit: Payer: Medicare Other | Admitting: Student

## 2020-05-09 DIAGNOSIS — G9341 Metabolic encephalopathy: Secondary | ICD-10-CM | POA: Diagnosis not present

## 2020-05-09 DIAGNOSIS — C3491 Malignant neoplasm of unspecified part of right bronchus or lung: Secondary | ICD-10-CM | POA: Diagnosis not present

## 2020-05-09 DIAGNOSIS — Z9981 Dependence on supplemental oxygen: Secondary | ICD-10-CM | POA: Diagnosis not present

## 2020-05-09 DIAGNOSIS — Z7984 Long term (current) use of oral hypoglycemic drugs: Secondary | ICD-10-CM | POA: Diagnosis not present

## 2020-05-09 DIAGNOSIS — E039 Hypothyroidism, unspecified: Secondary | ICD-10-CM | POA: Diagnosis not present

## 2020-05-09 DIAGNOSIS — F32A Depression, unspecified: Secondary | ICD-10-CM | POA: Diagnosis not present

## 2020-05-09 DIAGNOSIS — N390 Urinary tract infection, site not specified: Secondary | ICD-10-CM

## 2020-05-09 DIAGNOSIS — R338 Other retention of urine: Secondary | ICD-10-CM | POA: Diagnosis not present

## 2020-05-09 DIAGNOSIS — M1990 Unspecified osteoarthritis, unspecified site: Secondary | ICD-10-CM | POA: Diagnosis not present

## 2020-05-09 DIAGNOSIS — F17210 Nicotine dependence, cigarettes, uncomplicated: Secondary | ICD-10-CM | POA: Diagnosis not present

## 2020-05-09 DIAGNOSIS — F419 Anxiety disorder, unspecified: Secondary | ICD-10-CM | POA: Diagnosis not present

## 2020-05-09 DIAGNOSIS — J440 Chronic obstructive pulmonary disease with acute lower respiratory infection: Secondary | ICD-10-CM | POA: Diagnosis not present

## 2020-05-09 DIAGNOSIS — T83518A Infection and inflammatory reaction due to other urinary catheter, initial encounter: Secondary | ICD-10-CM | POA: Diagnosis not present

## 2020-05-09 DIAGNOSIS — J9611 Chronic respiratory failure with hypoxia: Secondary | ICD-10-CM | POA: Diagnosis not present

## 2020-05-09 DIAGNOSIS — J449 Chronic obstructive pulmonary disease, unspecified: Secondary | ICD-10-CM | POA: Diagnosis not present

## 2020-05-09 DIAGNOSIS — R6520 Severe sepsis without septic shock: Secondary | ICD-10-CM | POA: Diagnosis not present

## 2020-05-09 DIAGNOSIS — I509 Heart failure, unspecified: Secondary | ICD-10-CM | POA: Diagnosis not present

## 2020-05-09 DIAGNOSIS — J209 Acute bronchitis, unspecified: Secondary | ICD-10-CM | POA: Diagnosis not present

## 2020-05-09 DIAGNOSIS — J9621 Acute and chronic respiratory failure with hypoxia: Secondary | ICD-10-CM | POA: Diagnosis not present

## 2020-05-09 DIAGNOSIS — E1165 Type 2 diabetes mellitus with hyperglycemia: Secondary | ICD-10-CM | POA: Diagnosis not present

## 2020-05-09 DIAGNOSIS — B9620 Unspecified Escherichia coli [E. coli] as the cause of diseases classified elsewhere: Secondary | ICD-10-CM | POA: Diagnosis not present

## 2020-05-09 DIAGNOSIS — J441 Chronic obstructive pulmonary disease with (acute) exacerbation: Secondary | ICD-10-CM | POA: Diagnosis not present

## 2020-05-09 DIAGNOSIS — H3530 Unspecified macular degeneration: Secondary | ICD-10-CM | POA: Diagnosis not present

## 2020-05-09 DIAGNOSIS — E1151 Type 2 diabetes mellitus with diabetic peripheral angiopathy without gangrene: Secondary | ICD-10-CM | POA: Diagnosis not present

## 2020-05-09 DIAGNOSIS — I110 Hypertensive heart disease with heart failure: Secondary | ICD-10-CM | POA: Diagnosis not present

## 2020-05-09 DIAGNOSIS — N401 Enlarged prostate with lower urinary tract symptoms: Secondary | ICD-10-CM | POA: Diagnosis not present

## 2020-05-09 DIAGNOSIS — E876 Hypokalemia: Secondary | ICD-10-CM | POA: Diagnosis not present

## 2020-05-09 DIAGNOSIS — Z466 Encounter for fitting and adjustment of urinary device: Secondary | ICD-10-CM | POA: Diagnosis not present

## 2020-05-09 DIAGNOSIS — E785 Hyperlipidemia, unspecified: Secondary | ICD-10-CM | POA: Diagnosis not present

## 2020-05-09 DIAGNOSIS — E1140 Type 2 diabetes mellitus with diabetic neuropathy, unspecified: Secondary | ICD-10-CM | POA: Diagnosis not present

## 2020-05-09 DIAGNOSIS — N138 Other obstructive and reflux uropathy: Secondary | ICD-10-CM | POA: Diagnosis not present

## 2020-05-09 DIAGNOSIS — G4733 Obstructive sleep apnea (adult) (pediatric): Secondary | ICD-10-CM | POA: Diagnosis not present

## 2020-05-09 DIAGNOSIS — N179 Acute kidney failure, unspecified: Secondary | ICD-10-CM | POA: Diagnosis not present

## 2020-05-09 DIAGNOSIS — N9989 Other postprocedural complications and disorders of genitourinary system: Secondary | ICD-10-CM | POA: Diagnosis not present

## 2020-05-09 DIAGNOSIS — A4152 Sepsis due to Pseudomonas: Secondary | ICD-10-CM | POA: Diagnosis not present

## 2020-05-09 MED ORDER — NITROFURANTOIN MACROCRYSTAL 50 MG PO CAPS: 50.0000 mg | ORAL_CAPSULE | Freq: Every day | ORAL | 0 refills | Status: DC

## 2020-05-09 MED ORDER — NITROFURANTOIN MONOHYD MACRO 100 MG PO CAPS: 100.0000 mg | ORAL_CAPSULE | Freq: Two times a day (BID) | ORAL | 0 refills | Status: DC

## 2020-05-09 NOTE — Progress Notes (Signed)
Patient called office after being discharged from the hospital recently for an UTI.  Per pt he was given amoxicillin for 5 days.   Dr. Alyson Ingles reviewed recent culture and has changed his medication to macrobid 100mg  po BID x 7days followed for macrobid 50mg  po nightly x 30 days. Instructions on dosage were reviewed with patient. Pt voiced understanding.   Pt scheduled for TURP for 05/31/2020. Recently had  His catheter changed while inpatient at the hospital.

## 2020-05-10 ENCOUNTER — Ambulatory Visit (INDEPENDENT_AMBULATORY_CARE_PROVIDER_SITE_OTHER): Payer: Medicare Other | Admitting: Family

## 2020-05-10 ENCOUNTER — Other Ambulatory Visit: Payer: Self-pay | Admitting: Family

## 2020-05-10 ENCOUNTER — Encounter: Payer: Self-pay | Admitting: Family

## 2020-05-10 VITALS — BP 145/82

## 2020-05-10 DIAGNOSIS — E1159 Type 2 diabetes mellitus with other circulatory complications: Secondary | ICD-10-CM

## 2020-05-10 DIAGNOSIS — I152 Hypertension secondary to endocrine disorders: Secondary | ICD-10-CM

## 2020-05-10 DIAGNOSIS — E785 Hyperlipidemia, unspecified: Secondary | ICD-10-CM

## 2020-05-10 DIAGNOSIS — E1169 Type 2 diabetes mellitus with other specified complication: Secondary | ICD-10-CM

## 2020-05-10 LAB — CULTURE, BLOOD (SINGLE)
Culture: NO GROWTH
Special Requests: ADEQUATE

## 2020-05-10 MED ORDER — LOSARTAN POTASSIUM 50 MG PO TABS: 50.0000 mg | ORAL_TABLET | Freq: Every day | ORAL | 3 refills | Status: DC

## 2020-05-10 NOTE — Progress Notes (Signed)
   Virtual Visit via telephone Note Due to COVID-19 pandemic this visit was conducted virtually. This visit type was conducted due to national recommendations for restrictions regarding the COVID-19 Pandemic (e.g. social distancing, sheltering in place) in an effort to limit this patient's exposure and mitigate transmission in our community. All issues noted in this document were discussed and addressed.  A physical exam was not performed with this format.  I connected with John [name] on 05/10/20 at 2:45 pm  by telephone and verified that I am speaking with the correct person using two identifiers. John [name] is currently located at home  and no one  is currently with him  during visit. The provider, Evelina Dun, FNP is located in their office at time of visit.  I discussed the limitations, risks, security and privacy concerns of performing an evaluation and management service by telephone and the availability of in person appointments. I also discussed with the patient that there may be a patient responsible charge related to this service. The patient expressed understanding and agreed to proceed.   History and Present Illness:  Pt calls the office today to discuss BP. He states he was hospitalized for a UTI. He was taken off his Benicar and Amlodipine and his metoprolol was increased to 50 mg from 25 mg. He is followed by his Urologists.   He has a home health nurse coming to his home twice a week. He reports his BP was elevated on their last visit.  Hypertension This is a chronic problem. The current episode started more than 1 year ago. The problem has been waxing and waning since onset. The problem is uncontrolled. Associated symptoms include malaise/fatigue, peripheral edema and shortness of breath. Pertinent negatives include no headaches. Risk factors for coronary artery disease include dyslipidemia, obesity, male gender and sedentary lifestyle. The current treatment provides  moderate improvement. There is no history of CVA or heart failure.      Review of Systems  Constitutional: Positive for malaise/fatigue.  Respiratory: Positive for shortness of breath.   Neurological: Negative for headaches.     Observations/Objective: No SOB or distress noted   Assessment and Plan: 1. Hypertension associated with diabetes ([place]) -Start losartan 50 mg today Continue other medication Keep follow up with me later this month Low salt diet - losartan (COZAAR) 50 MG tablet; Take 1 tablet (50 mg total) by mouth daily.  Dispense: 90 tablet; Refill: 3     I discussed the assessment and treatment plan with the patient. The patient was provided an opportunity to ask questions and all were answered. The patient agreed with the plan and demonstrated an understanding of the instructions.   The patient was advised to call back or seek an in-person evaluation if the symptoms worsen or if the condition fails to improve as anticipated.  The above assessment and management plan was discussed with the patient. The patient verbalized understanding of and has agreed to the management plan. Patient is aware to call the clinic if symptoms persist or worsen. Patient is aware when to return to the clinic for a follow-up visit. Patient educated on when it is appropriate to go to the emergency department.   Time call ended:  2:57 pm   I provided 12 minutes of non-face-to-face time during this encounter.    Evelina Dun, FNP

## 2020-05-11 DIAGNOSIS — A4152 Sepsis due to Pseudomonas: Secondary | ICD-10-CM | POA: Diagnosis not present

## 2020-05-11 DIAGNOSIS — B9620 Unspecified Escherichia coli [E. coli] as the cause of diseases classified elsewhere: Secondary | ICD-10-CM | POA: Diagnosis not present

## 2020-05-11 DIAGNOSIS — J441 Chronic obstructive pulmonary disease with (acute) exacerbation: Secondary | ICD-10-CM | POA: Diagnosis not present

## 2020-05-11 DIAGNOSIS — N390 Urinary tract infection, site not specified: Secondary | ICD-10-CM | POA: Diagnosis not present

## 2020-05-11 DIAGNOSIS — T83518A Infection and inflammatory reaction due to other urinary catheter, initial encounter: Secondary | ICD-10-CM | POA: Diagnosis not present

## 2020-05-11 DIAGNOSIS — G9341 Metabolic encephalopathy: Secondary | ICD-10-CM | POA: Diagnosis not present

## 2020-05-11 DIAGNOSIS — R6520 Severe sepsis without septic shock: Secondary | ICD-10-CM | POA: Diagnosis not present

## 2020-05-11 DIAGNOSIS — J209 Acute bronchitis, unspecified: Secondary | ICD-10-CM | POA: Diagnosis not present

## 2020-05-11 DIAGNOSIS — J440 Chronic obstructive pulmonary disease with acute lower respiratory infection: Secondary | ICD-10-CM | POA: Diagnosis not present

## 2020-05-11 DIAGNOSIS — N179 Acute kidney failure, unspecified: Secondary | ICD-10-CM | POA: Diagnosis not present

## 2020-05-11 DIAGNOSIS — N9989 Other postprocedural complications and disorders of genitourinary system: Secondary | ICD-10-CM | POA: Diagnosis not present

## 2020-05-14 ENCOUNTER — Telehealth: Payer: Self-pay

## 2020-05-14 DIAGNOSIS — G9341 Metabolic encephalopathy: Secondary | ICD-10-CM | POA: Diagnosis not present

## 2020-05-14 DIAGNOSIS — T83518A Infection and inflammatory reaction due to other urinary catheter, initial encounter: Secondary | ICD-10-CM | POA: Diagnosis not present

## 2020-05-14 DIAGNOSIS — N179 Acute kidney failure, unspecified: Secondary | ICD-10-CM | POA: Diagnosis not present

## 2020-05-14 DIAGNOSIS — J209 Acute bronchitis, unspecified: Secondary | ICD-10-CM | POA: Diagnosis not present

## 2020-05-14 DIAGNOSIS — J441 Chronic obstructive pulmonary disease with (acute) exacerbation: Secondary | ICD-10-CM | POA: Diagnosis not present

## 2020-05-14 DIAGNOSIS — A4152 Sepsis due to Pseudomonas: Secondary | ICD-10-CM | POA: Diagnosis not present

## 2020-05-14 DIAGNOSIS — J440 Chronic obstructive pulmonary disease with acute lower respiratory infection: Secondary | ICD-10-CM | POA: Diagnosis not present

## 2020-05-14 DIAGNOSIS — B9620 Unspecified Escherichia coli [E. coli] as the cause of diseases classified elsewhere: Secondary | ICD-10-CM | POA: Diagnosis not present

## 2020-05-14 DIAGNOSIS — N390 Urinary tract infection, site not specified: Secondary | ICD-10-CM | POA: Diagnosis not present

## 2020-05-14 DIAGNOSIS — N9989 Other postprocedural complications and disorders of genitourinary system: Secondary | ICD-10-CM | POA: Diagnosis not present

## 2020-05-14 DIAGNOSIS — R6520 Severe sepsis without septic shock: Secondary | ICD-10-CM | POA: Diagnosis not present

## 2020-05-14 NOTE — Telephone Encounter (Signed)
REFERRAL REQUEST Telephone Note  Have you been seen at our office for this problem? yes (Advise that they may need an appointment with their PCP before a referral can be done)  Reason for Referral: Diabetes Consultant Referral discussed with patient: no Best contact number of patient for referral team:   [phone] Has patient been seen by a specialist for this issue before: no Patient provider preference for referral:  Patient location preference for referral:    Patient notified that referrals can take up to a week or longer to process. If they haven't heard anything within a week they should call back and speak with the referral department.   Lenna Gilford' pt.  Please call pt.

## 2020-05-15 DIAGNOSIS — B351 Tinea unguium: Secondary | ICD-10-CM | POA: Diagnosis not present

## 2020-05-15 DIAGNOSIS — M79676 Pain in unspecified toe(s): Secondary | ICD-10-CM | POA: Diagnosis not present

## 2020-05-15 DIAGNOSIS — L84 Corns and callosities: Secondary | ICD-10-CM | POA: Diagnosis not present

## 2020-05-15 DIAGNOSIS — E1142 Type 2 diabetes mellitus with diabetic polyneuropathy: Secondary | ICD-10-CM | POA: Diagnosis not present

## 2020-05-16 DIAGNOSIS — A4152 Sepsis due to Pseudomonas: Secondary | ICD-10-CM | POA: Diagnosis not present

## 2020-05-16 DIAGNOSIS — G9341 Metabolic encephalopathy: Secondary | ICD-10-CM | POA: Diagnosis not present

## 2020-05-16 DIAGNOSIS — J441 Chronic obstructive pulmonary disease with (acute) exacerbation: Secondary | ICD-10-CM | POA: Diagnosis not present

## 2020-05-16 DIAGNOSIS — T83518A Infection and inflammatory reaction due to other urinary catheter, initial encounter: Secondary | ICD-10-CM | POA: Diagnosis not present

## 2020-05-16 DIAGNOSIS — N9989 Other postprocedural complications and disorders of genitourinary system: Secondary | ICD-10-CM | POA: Diagnosis not present

## 2020-05-16 DIAGNOSIS — J209 Acute bronchitis, unspecified: Secondary | ICD-10-CM | POA: Diagnosis not present

## 2020-05-16 DIAGNOSIS — N179 Acute kidney failure, unspecified: Secondary | ICD-10-CM | POA: Diagnosis not present

## 2020-05-16 DIAGNOSIS — R6520 Severe sepsis without septic shock: Secondary | ICD-10-CM | POA: Diagnosis not present

## 2020-05-16 DIAGNOSIS — B9620 Unspecified Escherichia coli [E. coli] as the cause of diseases classified elsewhere: Secondary | ICD-10-CM | POA: Diagnosis not present

## 2020-05-16 DIAGNOSIS — J440 Chronic obstructive pulmonary disease with acute lower respiratory infection: Secondary | ICD-10-CM | POA: Diagnosis not present

## 2020-05-16 DIAGNOSIS — N390 Urinary tract infection, site not specified: Secondary | ICD-10-CM | POA: Diagnosis not present

## 2020-05-18 DIAGNOSIS — J209 Acute bronchitis, unspecified: Secondary | ICD-10-CM | POA: Diagnosis not present

## 2020-05-18 DIAGNOSIS — T83518A Infection and inflammatory reaction due to other urinary catheter, initial encounter: Secondary | ICD-10-CM | POA: Diagnosis not present

## 2020-05-18 DIAGNOSIS — J440 Chronic obstructive pulmonary disease with acute lower respiratory infection: Secondary | ICD-10-CM | POA: Diagnosis not present

## 2020-05-18 DIAGNOSIS — R6520 Severe sepsis without septic shock: Secondary | ICD-10-CM | POA: Diagnosis not present

## 2020-05-18 DIAGNOSIS — N9989 Other postprocedural complications and disorders of genitourinary system: Secondary | ICD-10-CM | POA: Diagnosis not present

## 2020-05-18 DIAGNOSIS — J441 Chronic obstructive pulmonary disease with (acute) exacerbation: Secondary | ICD-10-CM | POA: Diagnosis not present

## 2020-05-18 DIAGNOSIS — B9620 Unspecified Escherichia coli [E. coli] as the cause of diseases classified elsewhere: Secondary | ICD-10-CM | POA: Diagnosis not present

## 2020-05-18 DIAGNOSIS — A4152 Sepsis due to Pseudomonas: Secondary | ICD-10-CM | POA: Diagnosis not present

## 2020-05-18 DIAGNOSIS — G9341 Metabolic encephalopathy: Secondary | ICD-10-CM | POA: Diagnosis not present

## 2020-05-18 DIAGNOSIS — N179 Acute kidney failure, unspecified: Secondary | ICD-10-CM | POA: Diagnosis not present

## 2020-05-18 DIAGNOSIS — N390 Urinary tract infection, site not specified: Secondary | ICD-10-CM | POA: Diagnosis not present

## 2020-05-21 DIAGNOSIS — R6520 Severe sepsis without septic shock: Secondary | ICD-10-CM | POA: Diagnosis not present

## 2020-05-21 DIAGNOSIS — J209 Acute bronchitis, unspecified: Secondary | ICD-10-CM | POA: Diagnosis not present

## 2020-05-21 DIAGNOSIS — T83518A Infection and inflammatory reaction due to other urinary catheter, initial encounter: Secondary | ICD-10-CM | POA: Diagnosis not present

## 2020-05-21 DIAGNOSIS — N9989 Other postprocedural complications and disorders of genitourinary system: Secondary | ICD-10-CM | POA: Diagnosis not present

## 2020-05-21 DIAGNOSIS — J441 Chronic obstructive pulmonary disease with (acute) exacerbation: Secondary | ICD-10-CM | POA: Diagnosis not present

## 2020-05-21 DIAGNOSIS — N179 Acute kidney failure, unspecified: Secondary | ICD-10-CM | POA: Diagnosis not present

## 2020-05-21 DIAGNOSIS — G9341 Metabolic encephalopathy: Secondary | ICD-10-CM | POA: Diagnosis not present

## 2020-05-21 DIAGNOSIS — B9620 Unspecified Escherichia coli [E. coli] as the cause of diseases classified elsewhere: Secondary | ICD-10-CM | POA: Diagnosis not present

## 2020-05-21 DIAGNOSIS — J440 Chronic obstructive pulmonary disease with acute lower respiratory infection: Secondary | ICD-10-CM | POA: Diagnosis not present

## 2020-05-21 DIAGNOSIS — A4152 Sepsis due to Pseudomonas: Secondary | ICD-10-CM | POA: Diagnosis not present

## 2020-05-21 DIAGNOSIS — N390 Urinary tract infection, site not specified: Secondary | ICD-10-CM | POA: Diagnosis not present

## 2020-05-22 ENCOUNTER — Ambulatory Visit (INDEPENDENT_AMBULATORY_CARE_PROVIDER_SITE_OTHER): Payer: Medicare Other

## 2020-05-22 ENCOUNTER — Other Ambulatory Visit: Payer: Self-pay

## 2020-05-22 DIAGNOSIS — N390 Urinary tract infection, site not specified: Secondary | ICD-10-CM | POA: Diagnosis not present

## 2020-05-22 DIAGNOSIS — F17210 Nicotine dependence, cigarettes, uncomplicated: Secondary | ICD-10-CM

## 2020-05-22 DIAGNOSIS — N401 Enlarged prostate with lower urinary tract symptoms: Secondary | ICD-10-CM

## 2020-05-22 DIAGNOSIS — T83518A Infection and inflammatory reaction due to other urinary catheter, initial encounter: Secondary | ICD-10-CM

## 2020-05-22 DIAGNOSIS — H3530 Unspecified macular degeneration: Secondary | ICD-10-CM

## 2020-05-22 DIAGNOSIS — J9621 Acute and chronic respiratory failure with hypoxia: Secondary | ICD-10-CM | POA: Diagnosis not present

## 2020-05-22 DIAGNOSIS — R338 Other retention of urine: Secondary | ICD-10-CM

## 2020-05-22 DIAGNOSIS — I509 Heart failure, unspecified: Secondary | ICD-10-CM

## 2020-05-22 DIAGNOSIS — J209 Acute bronchitis, unspecified: Secondary | ICD-10-CM | POA: Diagnosis not present

## 2020-05-22 DIAGNOSIS — G4733 Obstructive sleep apnea (adult) (pediatric): Secondary | ICD-10-CM

## 2020-05-22 DIAGNOSIS — J440 Chronic obstructive pulmonary disease with acute lower respiratory infection: Secondary | ICD-10-CM | POA: Diagnosis not present

## 2020-05-22 DIAGNOSIS — E1151 Type 2 diabetes mellitus with diabetic peripheral angiopathy without gangrene: Secondary | ICD-10-CM | POA: Diagnosis not present

## 2020-05-22 DIAGNOSIS — J441 Chronic obstructive pulmonary disease with (acute) exacerbation: Secondary | ICD-10-CM

## 2020-05-22 DIAGNOSIS — E1165 Type 2 diabetes mellitus with hyperglycemia: Secondary | ICD-10-CM | POA: Diagnosis not present

## 2020-05-22 DIAGNOSIS — E1140 Type 2 diabetes mellitus with diabetic neuropathy, unspecified: Secondary | ICD-10-CM

## 2020-05-22 DIAGNOSIS — C3491 Malignant neoplasm of unspecified part of right bronchus or lung: Secondary | ICD-10-CM

## 2020-05-22 DIAGNOSIS — N138 Other obstructive and reflux uropathy: Secondary | ICD-10-CM

## 2020-05-22 DIAGNOSIS — B9620 Unspecified Escherichia coli [E. coli] as the cause of diseases classified elsewhere: Secondary | ICD-10-CM | POA: Diagnosis not present

## 2020-05-22 DIAGNOSIS — I110 Hypertensive heart disease with heart failure: Secondary | ICD-10-CM | POA: Diagnosis not present

## 2020-05-22 DIAGNOSIS — E039 Hypothyroidism, unspecified: Secondary | ICD-10-CM

## 2020-05-22 DIAGNOSIS — E785 Hyperlipidemia, unspecified: Secondary | ICD-10-CM

## 2020-05-22 DIAGNOSIS — F32A Depression, unspecified: Secondary | ICD-10-CM

## 2020-05-22 DIAGNOSIS — E876 Hypokalemia: Secondary | ICD-10-CM

## 2020-05-24 ENCOUNTER — Ambulatory Visit (INDEPENDENT_AMBULATORY_CARE_PROVIDER_SITE_OTHER): Payer: Medicare Other | Admitting: Student

## 2020-05-24 ENCOUNTER — Other Ambulatory Visit: Payer: Self-pay

## 2020-05-24 ENCOUNTER — Encounter: Payer: Self-pay | Admitting: Student

## 2020-05-24 ENCOUNTER — Ambulatory Visit (INDEPENDENT_AMBULATORY_CARE_PROVIDER_SITE_OTHER): Payer: Medicare Other | Admitting: Family

## 2020-05-24 ENCOUNTER — Encounter: Payer: Self-pay | Admitting: Family

## 2020-05-24 VITALS — BP 140/73 | HR 107 | Temp 97.2°F | Ht 66.0 in | Wt 170.4 lb

## 2020-05-24 VITALS — BP 116/54 | HR 106 | Ht 66.0 in | Wt 170.8 lb

## 2020-05-24 DIAGNOSIS — N138 Other obstructive and reflux uropathy: Secondary | ICD-10-CM | POA: Diagnosis not present

## 2020-05-24 DIAGNOSIS — R339 Retention of urine, unspecified: Secondary | ICD-10-CM | POA: Diagnosis not present

## 2020-05-24 DIAGNOSIS — Z09 Encounter for follow-up examination after completed treatment for conditions other than malignant neoplasm: Secondary | ICD-10-CM | POA: Diagnosis not present

## 2020-05-24 DIAGNOSIS — I10 Essential (primary) hypertension: Secondary | ICD-10-CM

## 2020-05-24 DIAGNOSIS — N401 Enlarged prostate with lower urinary tract symptoms: Secondary | ICD-10-CM

## 2020-05-24 DIAGNOSIS — I739 Peripheral vascular disease, unspecified: Secondary | ICD-10-CM | POA: Diagnosis not present

## 2020-05-24 DIAGNOSIS — J449 Chronic obstructive pulmonary disease, unspecified: Secondary | ICD-10-CM | POA: Diagnosis not present

## 2020-05-24 DIAGNOSIS — N3001 Acute cystitis with hematuria: Secondary | ICD-10-CM | POA: Diagnosis not present

## 2020-05-24 DIAGNOSIS — I2510 Atherosclerotic heart disease of native coronary artery without angina pectoris: Secondary | ICD-10-CM | POA: Diagnosis not present

## 2020-05-24 DIAGNOSIS — R000 Tachycardia, unspecified: Secondary | ICD-10-CM | POA: Diagnosis not present

## 2020-05-24 DIAGNOSIS — Z720 Tobacco use: Secondary | ICD-10-CM

## 2020-05-24 DIAGNOSIS — E1142 Type 2 diabetes mellitus with diabetic polyneuropathy: Secondary | ICD-10-CM | POA: Diagnosis not present

## 2020-05-24 DIAGNOSIS — Z01810 Encounter for preprocedural cardiovascular examination: Secondary | ICD-10-CM

## 2020-05-24 LAB — URINALYSIS, COMPLETE
Bilirubin, UA: NEGATIVE
Glucose, UA: NEGATIVE
Ketones, UA: NEGATIVE
Leukocytes,UA: NEGATIVE
Nitrite, UA: NEGATIVE
RBC, UA: NEGATIVE
Specific Gravity, UA: 1.01 (ref 1.005–1.030)
Urobilinogen, Ur: 0.2 mg/dL (ref 0.2–1.0)
pH, UA: 6 (ref 5.0–7.5)

## 2020-05-24 LAB — MICROSCOPIC EXAMINATION
RBC, Urine: NONE SEEN /hpf (ref 0–2)
WBC, UA: NONE SEEN /hpf (ref 0–5)

## 2020-05-24 MED ORDER — METOPROLOL SUCCINATE ER 25 MG PO TB24: 37.5000 mg | ORAL_TABLET | Freq: Every day | ORAL | 3 refills | Status: DC

## 2020-05-24 NOTE — Patient Instructions (Signed)
Diabetes Mellitus and Nutrition, Adult When you have diabetes, or diabetes mellitus, it is very important to have healthy eating habits because your blood sugar (glucose) levels are greatly affected by what you eat and drink. Eating healthy foods in the right amounts, at about the same times every day, can help you:  Control your blood glucose.  Lower your risk of heart disease.  Improve your blood pressure.  Reach or maintain a healthy weight. What can affect my meal plan? Every person with diabetes is different, and each person has different needs for a meal plan. Your health care provider may recommend that you work with a dietitian to make a meal plan that is best for you. Your meal plan may vary depending on factors such as:  The calories you need.  The medicines you take.  Your weight.  Your blood glucose, blood pressure, and cholesterol levels.  Your activity level.  Other health conditions you have, such as heart or kidney disease. How do carbohydrates affect me? Carbohydrates, also called carbs, affect your blood glucose level more than any other type of food. Eating carbs naturally raises the amount of glucose in your blood. Carb counting is a method for keeping track of how many carbs you eat. Counting carbs is important to keep your blood glucose at a healthy level, especially if you use insulin or take certain oral diabetes medicines. It is important to know how many carbs you can safely have in each meal. This is different for every person. Your dietitian can help you calculate how many carbs you should have at each meal and for each snack. How does alcohol affect me? Alcohol can cause a sudden decrease in blood glucose (hypoglycemia), especially if you use insulin or take certain oral diabetes medicines. Hypoglycemia can be a life-threatening condition. Symptoms of hypoglycemia, such as sleepiness, dizziness, and confusion, are similar to symptoms of having too much  alcohol.  Do not drink alcohol if: ? Your health care provider tells you not to drink. ? You are pregnant, may be pregnant, or are planning to become pregnant.  If you drink alcohol: ? Do not drink on an empty stomach. ? Limit how much you use to:  0-1 drink a day for women.  0-2 drinks a day for men. ? Be aware of how much alcohol is in your drink. In the [place] one drink equals one 12 oz bottle of beer (355 mL), one 5 oz glass of wine (148 mL), or one 1 oz glass of hard liquor (44 mL). ? Keep yourself hydrated with water, diet soda, or unsweetened iced tea.  Keep in mind that regular soda, juice, and other mixers may contain a lot of sugar and must be counted as carbs. What are tips for following this plan? Reading food labels  Start by checking the serving size on the "Nutrition Facts" label of packaged foods and drinks. The amount of calories, carbs, fats, and other nutrients listed on the label is based on one serving of the item. Many items contain more than one serving per package.  Check the total grams (g) of carbs in one serving. You can calculate the number of servings of carbs in one serving by dividing the total carbs by 15. For example, if a food has 30 g of total carbs per serving, it would be equal to 2 servings of carbs.  Check the number of grams (g) of saturated fats and trans fats in one serving. Choose foods that have   a low amount or none of these fats.  Check the number of milligrams (mg) of salt (sodium) in one serving. Most people should limit total sodium intake to less than 2,300 mg per day.  Always check the nutrition information of foods labeled as "low-fat" or "nonfat." These foods may be higher in added sugar or refined carbs and should be avoided.  Talk to your dietitian to identify your daily goals for nutrients listed on the label. Shopping  Avoid buying canned, pre-made, or processed foods. These foods tend to be high in fat, sodium, and added  sugar.  Shop around the outside edge of the grocery store. This is where you will most often find fresh fruits and vegetables, bulk grains, fresh meats, and fresh dairy. Cooking  Use low-heat cooking methods, such as baking, instead of high-heat cooking methods like deep frying.  Cook using healthy oils, such as olive, canola, or sunflower oil.  Avoid cooking with butter, cream, or high-fat meats. Meal planning  Eat meals and snacks regularly, preferably at the same times every day. Avoid going long periods of time without eating.  Eat foods that are high in fiber, such as fresh fruits, vegetables, beans, and whole grains. Talk with your dietitian about how many servings of carbs you can eat at each meal.  Eat 4-6 oz (112-168 g) of lean protein each day, such as lean meat, chicken, fish, eggs, or tofu. One ounce (oz) of lean protein is equal to: ? 1 oz (28 g) of meat, chicken, or fish. ? 1 egg. ?  cup (62 g) of tofu.  Eat some foods each day that contain healthy fats, such as avocado, nuts, seeds, and fish.   What foods should I eat? Fruits Berries. Apples. Oranges. Peaches. Apricots. Plums. Grapes. Mango. Papaya. Pomegranate. Kiwi. Cherries. Vegetables Lettuce. Spinach. Leafy greens, including kale, chard, collard greens, and mustard greens. Beets. Cauliflower. Cabbage. Broccoli. Carrots. Green beans. Tomatoes. Peppers. Onions. Cucumbers. Brussels sprouts. Grains Whole grains, such as whole-wheat or whole-grain bread, crackers, tortillas, cereal, and pasta. Unsweetened oatmeal. Quinoa. Brown or wild rice. Meats and other proteins Seafood. Poultry without skin. Lean cuts of poultry and beef. Tofu. Nuts. Seeds. Dairy Low-fat or fat-free dairy products such as milk, yogurt, and cheese. The items listed above may not be a complete list of foods and beverages you can eat. Contact a dietitian for more information. What foods should I avoid? Fruits Fruits canned with  syrup. Vegetables Canned vegetables. Frozen vegetables with butter or cream sauce. Grains Refined white flour and flour products such as bread, pasta, snack foods, and cereals. Avoid all processed foods. Meats and other proteins Fatty cuts of meat. Poultry with skin. Breaded or fried meats. Processed meat. Avoid saturated fats. Dairy Full-fat yogurt, cheese, or milk. Beverages Sweetened drinks, such as soda or iced tea. The items listed above may not be a complete list of foods and beverages you should avoid. Contact a dietitian for more information. Questions to ask a health care provider  Do I need to meet with a diabetes educator?  Do I need to meet with a dietitian?  What number can I call if I have questions?  When are the best times to check my blood glucose? Where to find more information:  American Diabetes Association: diabetes.org  Academy of Nutrition and Dietetics: www.eatright.org  National Institute of Diabetes and Digestive and Kidney Diseases: www.niddk.nih.gov  Association of Diabetes Care and Education Specialists: www.diabeteseducator.org Summary  It is important to have healthy eating   habits because your blood sugar (glucose) levels are greatly affected by what you eat and drink.  A healthy meal plan will help you control your blood glucose and maintain a healthy lifestyle.  Your health care provider may recommend that you work with a dietitian to make a meal plan that is best for you.  Keep in mind that carbohydrates (carbs) and alcohol have immediate effects on your blood glucose levels. It is important to count carbs and to use alcohol carefully. This information is not intended to replace advice given to you by your health care provider. Make sure you discuss any questions you have with your health care provider. Document Revised: 03/01/2019 Document Reviewed: 03/01/2019 [employer] Patient Education  2021 [employer]  

## 2020-05-24 NOTE — Progress Notes (Signed)
Cardiology Office Note    Date:  05/24/2020   ID:  John [name], DOB 01/04/1946, MRN [id number]  PCP:  [name], FNP  Cardiologist: [name], MD (Inactive)  --> Needs to switch to new MD PV: Dr. Gwenlyn Found   Chief Complaint  Patient presents with  . Follow-up    6 month visit    History of Present Illness:    John [name] is a 75 y.o. male with past medical history of CAD (coronary calcifications by prior CT with NST in 2019 showing scar versus soft tissue with no ischemia), PAD, Type 2 DM, HTN, HLD, NSCLC, COPD, OSA (on CPAP) and tobacco use who presents to the office today for 38-monthfollow-up.  He was examined by myself in 11/2019 and recent lower extremity dopplers had demonstrated 75-99% SFA stenosis, therefore he was referred to Dr. BGwenlyn Foundfor further evaluation. He underwent lower extremity angiography in 02/2020 which showed high-grade calcified ostial right common iliac stenosis followed by high-grade right common femoral and profunda stenosis and mid right SFA stenosis and it was felt he would need endarterectomy and patch angioplasty as well as profunda angioplasty of his right common femoral artery and profunda performed surgically after which would require orbital atherectomy and stenting of his right common iliac artery followed by staged right SFA intervention. His procedure was complicated by urinary retention postoperatively and he required an indwelling Foley catheter. By review of notes on 02/21/2020, he was referred to Dr. CCarlis Abbottfor right common femoral endarterectomy.   He was hospitalized later that month for sepsis secondary to an E. coli UTI.  He did follow-up with Dr. CCarlis Abbotton 05/01/2020 and was being followed by urology with plans for TURP on 05/31/2020. He wished to delay his PAD surgery until after he had undergone prostate surgery. Was again hospitalized from 1/29 - 05/08/2020 for urosepsis in the setting of a catheter associated UTI.   In talking  with the patient today, he reports overall feeling well. He denies any recent chest pain or palpitations. Says his breathing has been at baseline. No recent orthopnea, PND or edema. His Toprol-XL was increased from 233mdaily to 5040maily during his prior admission and he says he has felt "off" since this adjustment.    Past Medical History:  Diagnosis Date  . Adenocarcinoma of lung, right ([place]/03/2017  . Anxiety   . Arthritis   . Asthma   . BPH (benign prostatic hyperplasia)    with urinary retention 02/06/20  . CHF (congestive heart failure) ([facility] . COPD (chronic obstructive pulmonary disease) ([facility] . Depression   . Diabetes mellitus without complication (HCC)    no meds  . DM (diabetes mellitus) ([facility]/07/2016  . Dyspnea   . History of kidney stones   . Hyperlipidemia   . Hypertension   . Hypothyroidism   . Macular degeneration   . Neuropathy   . Non-small cell lung cancer, right ([facility]/03/2017  . Peripheral vascular disease ([place] . Prostatitis   . Pulmonary nodule, left 07/16/2016  . Sleep apnea    cpap    Past Surgical History:  Procedure Laterality Date  . ABDOMINAL AORTOGRAM W/LOWER EXTREMITY Left 02/06/2020   Procedure: ABDOMINAL AORTOGRAM W/LOWER EXTREMITY;  Surgeon: BerLorretta HarpD;  Location: [facility];  Service: Cardiovascular;  Laterality: Left;  . CATARACT EXTRACTION, BILATERAL Bilateral   . NO PAST SURGERIES    . PORTACATH PLACEMENT Left 06/13/2016  Procedure: INSERTION PORT-A-CATH;  Surgeon: Aviva Signs, MD;  Location: [facility];  Service: General;  Laterality: Left;  Marland Kitchen VIDEO BRONCHOSCOPY WITH ENDOBRONCHIAL NAVIGATION N/A 05/28/2016   Procedure: VIDEO BRONCHOSCOPY WITH ENDOBRONCHIAL NAVIGATION;  Surgeon: [name], MD;  Location: [facility];  Service: Thoracic;  Laterality: N/A;  . VIDEO BRONCHOSCOPY WITH ENDOBRONCHIAL ULTRASOUND N/A 05/28/2016   Procedure: VIDEO BRONCHOSCOPY WITH ENDOBRONCHIAL ULTRASOUND;  Surgeon: [name], MD;  Location: [facility];  Service: Thoracic;  Laterality: N/A;    Current Medications: Outpatient Medications Prior to Visit  Medication Sig Dispense Refill  . acetaminophen (TYLENOL) 500 MG tablet Take 1,000 mg by mouth at bedtime as needed for moderate pain.    Marland Kitchen acetaminophen (TYLENOL) 650 MG CR tablet Take 650 mg by mouth every 8 (eight) hours as needed for pain.    Marland Kitchen albuterol (VENTOLIN HFA) 108 (90 Base) MCG/ACT inhaler Inhale 2 puffs into the lungs every 4 (four) hours as needed for wheezing or shortness of breath. 8 g 6  . aspirin EC 81 MG tablet Take 81 mg by mouth daily.    . Blood Glucose Monitoring Suppl (ONETOUCH VERIO REFLECT) w/Device KIT Use to test blood sugars daily as directed. DX: E11.9 1 kit 1  . cloNIDine (CATAPRES) 0.1 MG tablet TAKE 1 TABLET (0.1 MG TOTAL) BY MOUTH 3 (THREE) TIMES DAILY. 270 tablet 1  . dutasteride (AVODART) 0.5 MG capsule TAKE 1 CAPSULE BY MOUTH EVERY DAY (Patient taking differently: Take 0.5 mg by mouth daily.) 90 capsule 1  . fluticasone (FLONASE) 50 MCG/ACT nasal spray Place 1 spray into both nostrils daily as needed for allergies or rhinitis.    . furosemide (LASIX) 20 MG tablet Take 1 tablet (20 mg total) by mouth 2 (two) times daily. 180 tablet 1  . gabapentin (NEURONTIN) 300 MG capsule Take 1 capsule (300 mg total) by mouth 3 (three) times daily. (Patient taking differently: Take 300-600 mg by mouth See admin instructions. Take 1 capsule (300 mg) by mouth in the morning & take 2 capsules (600 mg) by mouth in the evening.) 270 capsule 2  . glucose blood (ONETOUCH VERIO) test strip Use to test blood sugars daily as directed. DX: E11.9 100 each 12  . levothyroxine (SYNTHROID) 75 MCG tablet Take 1 tablet (75 mcg total) by mouth daily. 90 tablet 3  . linaclotide (LINZESS) 72 MCG capsule Take 1 capsule (72 mcg total) by mouth daily before breakfast. (Patient taking differently: Take 72 mcg by mouth daily as needed (constipation.).) 30 capsule 2   . losartan (COZAAR) 50 MG tablet Take 1 tablet (50 mg total) by mouth daily. 90 tablet 3  . meloxicam (MOBIC) 7.5 MG tablet Take 1 tablet (7.5 mg total) by mouth daily as needed for pain (back pain). 90 tablet 0  . metFORMIN (GLUCOPHAGE-XR) 500 MG 24 hr tablet Take 1 tablet (500 mg total) by mouth daily with breakfast. 90 tablet 1  . nitrofurantoin (MACRODANTIN) 50 MG capsule Take 1 capsule (50 mg total) by mouth at bedtime. 30 capsule 0  . ondansetron (ZOFRAN ODT) 4 MG disintegrating tablet Take 1 tablet (4 mg total) by mouth every 8 (eight) hours as needed for nausea or vomiting. 20 tablet 0  . OneTouch Delica Lancets 62Z MISC Use to test blood sugars daily as directed. DX: E11.9 100 each 1  . pravastatin (PRAVACHOL) 40 MG tablet TAKE 1 TABLET (40 MG TOTAL) BY MOUTH ON MONDAYS , WEDNESDAYS, AND FRIDAYS AS DIRECTED (Patient taking differently: Take 40  mg by mouth every Monday, Wednesday, and Friday.) 48 tablet 3  . Semaglutide (RYBELSUS) 7 MG TABS Take 7 mg by mouth daily. 90 tablet 1  . tamsulosin (FLOMAX) 0.4 MG CAPS capsule Take 2 capsules (0.8 mg total) by mouth daily after supper. 120 capsule 1  . TRELEGY ELLIPTA 100-62.5-25 MCG/INH AEPB Inhale 1 puff into the lungs daily. 1 each 3  . zolpidem (AMBIEN) 5 MG tablet Take 5 mg by mouth at bedtime as needed for sleep.    . metoprolol succinate (TOPROL-XL) 50 MG 24 hr tablet Take 1 tablet (50 mg total) by mouth daily. Take with or immediately following a meal. 30 tablet 1   No facility-administered medications prior to visit.     Allergies:   Jardiance [empagliflozin] and Lopressor [metoprolol]   Social History   Socioeconomic History  . Marital status: Divorced    Spouse name: Not on file  . Number of children: 5  . Years of education: 10  . Highest education level: 10th grade  Occupational History  . Occupation: Retired  Tobacco Use  . Smoking status: Current Every Day Smoker    Packs/day: 0.50    Years: 55.00    Pack years:  27.50    Types: Cigarettes    Start date: 03/11/1961  . Smokeless tobacco: Never Used  . Tobacco comment: 1/2 pack to 1 pack per day 12/14/19  Vaping Use  . Vaping Use: Never used  Substance and Sexual Activity  . Alcohol use: No  . Drug use: No  . Sexual activity: Yes    Birth control/protection: None  Other Topics Concern  . Not on file  Social History Narrative  . Not on file   Social Determinants of Health   Financial Resource Strain: Low Risk   . Difficulty of Paying Living Expenses: Not hard at all  Food Insecurity: No Food Insecurity  . Worried About Charity fundraiser in the Last Year: Never true  . Ran Out of Food in the Last Year: Never true  Transportation Needs: No Transportation Needs  . Lack of Transportation (Medical): No  . Lack of Transportation (Non-Medical): No  Physical Activity: Inactive  . Days of Exercise per Week: 0 days  . Minutes of Exercise per Session: 0 min  Stress: No Stress Concern Present  . Feeling of Stress : Not at all  Social Connections: Moderately Isolated  . Frequency of Communication with Friends and Family: More than three times a week  . Frequency of Social Gatherings with Friends and Family: More than three times a week  . Attends Religious Services: Never  . Active Member of Clubs or Organizations: No  . Attends Archivist Meetings: Never  . Marital Status: Living with partner     Family History:  The patient's family history includes Diabetes in his father; Heart disease in his father; Hypertension in his mother and sister; Stroke in his father.   Review of Systems:   Please see the history of present illness.     General:  No chills, fever, night sweats or weight changes.  Cardiovascular:  No chest pain, edema, orthopnea, palpitations, paroxysmal nocturnal dyspnea. Positive for dyspnea on exertion (at baseline).  Dermatological: No rash, lesions/masses Respiratory: No cough, dyspnea Urologic: No hematuria,  dysuria Abdominal:   No nausea, vomiting, diarrhea, bright red blood per rectum, melena, or hematemesis Neurologic:  No visual changes, wkns, changes in mental status. All other systems reviewed and are otherwise negative except as noted  above.   Physical Exam:    VS:  BP (!) 116/54   Pulse (!) 106   Ht 5' 6" (1.676 m)   Wt 170 lb 12.8 oz (77.5 kg)   SpO2 94%   BMI 27.57 kg/m    General: Well developed, well nourished,male appearing in no acute distress. Head: Normocephalic, atraumatic. Neck: No carotid bruits. JVD not elevated.  Lungs: Respirations regular and unlabored, without wheezes or rales.  Heart: Regular rate and rhythm. No S3 or S4.  No murmur, no rubs, or gallops appreciated. Abdomen: Appears non-distended. No obvious abdominal masses. Msk:  Strength and tone appear normal for age. No obvious joint deformities or effusions. Extremities: No clubbing or cyanosis. No lower extremity edema.  Distal pedal pulses are 2+ bilaterally. Neuro: Alert and oriented X 3. Moves all extremities spontaneously. No focal deficits noted. Psych:  Responds to questions appropriately with a normal affect. Skin: No rashes or lesions noted  Wt Readings from Last 3 Encounters:  05/24/20 170 lb 12.8 oz (77.5 kg)  05/24/20 170 lb 6.4 oz (77.3 kg)  05/07/20 169 lb 12.1 oz (77 kg)     Studies/Labs Reviewed:   EKG:  EKG is ordered today.  The ekg ordered today demonstrates sinus tachycardia, HR 102 with 1st degree AV Block. No acute ST changes when compared to prior tracings.   Recent Labs: 03/23/2020: TSH 1.503 05/06/2020: ALT 15 05/08/2020: BUN 18; Creatinine, Ser 0.74; Hemoglobin 13.1; Magnesium 2.1; Platelets 307; Potassium 3.7; Sodium 137   Lipid Panel    Component Value Date/Time   CHOL 136 04/25/2019 1608   TRIG 175 (H) 04/25/2019 1608   HDL 28 (L) 04/25/2019 1608   CHOLHDL 4.9 04/25/2019 1608   LDLCALC 78 04/25/2019 1608    Additional studies/ records that were reviewed today  include:   Echocardiogram: 01/2018 Study Conclusions   - Left ventricle: The cavity size was normal. Wall thickness was  normal. Systolic function was normal. The estimated ejection  fraction was in the range of 60% to 65%. Although no diagnostic  regional wall motion abnormality was identified, this possibility  cannot be completely excluded on the basis of this study.  Indeterminate diastolic function.  - Aortic valve: Mildly calcified annulus. Probably trileaflet;  moderately calcified leaflets.  - Mitral valve: Moderately calcified annulus. There was trivial  regurgitation.  - Right ventricle: Systolic function was mildly reduced.  - Right atrium: Central venous pressure (est): 8 mm Hg.  - Tricuspid valve: There was trivial regurgitation.  - Pulmonary arteries: Systolic pressure could not be accurately  estimated.  - Pericardium, extracardiac: A prominent pericardial fat pad was  present. Cannot exclude small anteroapical pericardial effusion  with signs of chronicity.  NST: 03/2018  No diagnostic ST segment changes to indicate ischemia.  Medium sized, severe intensity, inferior defect extending from apex to base with inferior septal defect at the base. These regions are fixed and suggestive of scar versus soft tissue attenuation.  This is an intermediate risk study.  Nuclear stress EF: 48%. Visually, LVEF appears normal, consider echocardiogram if not done recently.   Lower Extremity Angiography: 02/2020 Angiographic Data:   1: Abdominal aorta-moderately atherosclerotic 2: Left lower extremity-50% calcified ostial/eccentric left common iliac artery stenosis without a pullback gradient noted.  There was three-vessel runoff 3: Right lower extremity-95% calcified ostial right common iliac artery stenosis, 90% eccentric calcified right common femoral artery stenosis, 95% ostial right profunda stenosis.  95% mid right SFA, 80% right tibioperoneal trunk  with three-vessel  runoff.  IMPRESSION: Mr. Guiney has high-grade calcified ostial right common iliac stenosis followed by high-grade right common femoral and profunda stenosis and mid right SFA stenosis.  I believe he will need endarterectomy and patch angioplasty as well as profunda angioplasty of his right common femoral artery and profunda performed surgically after which I plan to perform orbital atherectomy and stenting of his right common iliac artery followed by staged right SFA intervention from the contralateral approach should he have continued claudication.  A left common femoral angiogram was performed and a Mynx closure device was successfully deployed.  The patient left lab in stable condition.  He will be discharged home today as an outpatient and will follow up with me later this week for discussion about revascularization strategy.   Assessment:    1. Coronary artery calcification seen on CT scan   2. Peripheral arterial disease ([place])   3. Essential hypertension   4. Sinus tachycardia   5. Preoperative cardiovascular examination      Plan:   In order of problems listed above:  1. CAD - He has known coronary calcifications by prior CT and prior ischemic evaluation was a NST in 2019 showing scar versus soft tissue with no ischemia. - He does have baseline dyspnea on exertion in the setting of his COPD but he says this has been stable and he denies any recent chest pain.  - Continue current medication regimen with ASA 60m daily, Toprol-XL (with dose adjustment as outlined below) and Pravastatin 451mdaily.   2. PAD - He has known right common iliac stenosis, right common femoral and profunda stenosis and mid right SFA stenosis. Being followed by Vascular Surgery with plans for right common femoral endarterectomy once recovered from his upcoming urologic procedure.  - Reports his claudication symptoms have overall been stable. Continue ASA and statin therapy.   3. HTN -  BP is well-controlled at 116/54 during today's visit. Continue current medication regimen with Clonidine, Losartan and Toprol-XL (reducing dose to 37.24m39maily).   4. Sinus Tachycardia - His HR has been in the low-100's for several years. Toprol-XL was increased from 224m56mily to 50mg8mly during his recent admission and he reports feeling "off". Will try reducing to 37.24mg d72my to see if this helps with his symptoms.   5. Preoperative Cardiac Clearance for TURP - His RCRI Risk score is overall low at 0.4% risk of a major cardiac event and he is able to perform 4 METS of activity without anginal symptoms. No further cardiac testing is indicated at this time. He is of acceptable risk to proceed from a cardiac perspective.     Medication Adjustments/Labs and Tests Ordered: Current medicines are reviewed at length with the patient today.  Concerns regarding medicines are outlined above.  Medication changes, Labs and Tests ordered today are listed in the Patient Instructions below. Patient Instructions  Medication Instructions:   Reduce Toprol-XL to 37.24mg da59m.   Labwork:  None  Testing/Procedures:  None  Follow-Up:  With BrittanBernerd Phoor MD in 6 months.   Any Other Special Instructions Will Be Listed Below (If Applicable).     If you need a refill on your cardiac medications before your next appointment, please call your pharmacy.      Signed, BrittanErma Heritage 05/24/2020 8:58 PM    [facility] [address] [id number] [phone]

## 2020-05-24 NOTE — Patient Instructions (Signed)
Medication Instructions:   Reduce Toprol-XL to 37.5mg  daily.   Labwork:  None  Testing/Procedures:  None  Follow-Up:  With Bernerd Pho, PA-C or MD in 6 months.   Any Other Special Instructions Will Be Listed Below (If Applicable).     If you need a refill on your cardiac medications before your next appointment, please call your pharmacy.

## 2020-05-24 NOTE — Progress Notes (Signed)
Subjective:    Patient ID: John [name], male    DOB: 17-Jan-1946, 75 y.o.   MRN: [id number]  Chief Complaint  Patient presents with  . Transitions Of Care    UTI     HPI Pt presents to the office today for hospital follow up. He went to the ED on 05/05/20 with fevers and diagnosed with sepsis from UTI. He has BPH with urinary obstruction. He currently had a foley catheter in placed and plans to have surgery on 05/31/20.  He is followed by Urologists. He completed the Macrobid 100 mg BID for 7 days. He is now takes a Macrobid 50 mg daily.   He denies any fever. States he has intermittent dysuria and a strong urine smell.   He states he is feeling "fair". He has home health coming twice a week.   He was discharged on prednisone for COPD. He states his glucose has been elevated, but since he completed this his glucose has improved. Reports his glucose was 146.   He continues to smoke about 1/2 pack a day.   Review of Systems  Respiratory: Positive for shortness of breath.   Genitourinary: Positive for frequency and urgency.  All other systems reviewed and are negative.      Objective:   Physical Exam Vitals reviewed.  Constitutional:      General: He is not in acute distress.    Appearance: He is well-developed and well-nourished.  HENT:     Head: Normocephalic.     Right Ear: Tympanic membrane normal.     Left Ear: Tympanic membrane normal.     Mouth/Throat:     Mouth: Oropharynx is clear and moist.  Eyes:     General:        Right eye: No discharge.        Left eye: No discharge.     Pupils: Pupils are equal, round, and reactive to light.  Neck:     Thyroid: No thyromegaly.  Cardiovascular:     Rate and Rhythm: Normal rate and regular rhythm.     Pulses: Intact distal pulses.     Heart sounds: Normal heart sounds. No murmur heard.   Pulmonary:     Effort: Pulmonary effort is normal. No respiratory distress.     Breath sounds: Normal breath sounds. Decreased  air movement present. No wheezing.  Abdominal:     General: Bowel sounds are normal. There is no distension.     Palpations: Abdomen is soft.     Tenderness: There is no abdominal tenderness.  Musculoskeletal:        General: No tenderness or edema. Normal range of motion.     Cervical back: Normal range of motion and neck supple.  Skin:    General: Skin is warm and dry.     Findings: No erythema or rash.  Neurological:     Mental Status: He is alert and oriented to person, place, and time.     Cranial Nerves: No cranial nerve deficit.     Deep Tendon Reflexes: Reflexes are normal and symmetric.  Psychiatric:        Mood and Affect: Mood and affect normal.        Behavior: Behavior normal.        Thought Content: Thought content normal.        Judgment: Judgment normal.          BP 140/73   Pulse (!) 107   Temp (!) 97.2 F (  36.2 C) (Temporal)   Ht 5' 6" (1.676 m)   Wt 170 lb 6.4 oz (77.3 kg)   SpO2 97%   BMI 27.50 kg/m   Assessment & Plan:  Chandlor Noecker comes in today with chief complaint of Transitions Of Care (UTI )   Diagnosis and orders addressed:  1. BPH with urinary obstruction - CMP14+EGFR - CBC with Differential/Platelet  2. Acute cystitis with hematuria - CMP14+EGFR - CBC with Differential/Platelet  3. Urinary retention - CMP14+EGFR - CBC with Differential/Platelet  4. Chronic obstructive pulmonary disease, unspecified COPD type ([place]) - CMP14+EGFR - CBC with Differential/Platelet  5. Type 2 diabetes mellitus with diabetic polyneuropathy, without long-term current use of insulin (HCC) - CMP14+EGFR - CBC with Differential/Platelet  6. Hospital discharge follow-up  - CMP14+EGFR - CBC with Differential/Platelet  7. Tobacco abuse   Labs pending Health Maintenance reviewed Diet and exercise encouraged  Follow up plan: 3 months    Evelina Dun, FNP

## 2020-05-25 DIAGNOSIS — B9620 Unspecified Escherichia coli [E. coli] as the cause of diseases classified elsewhere: Secondary | ICD-10-CM | POA: Diagnosis not present

## 2020-05-25 DIAGNOSIS — J209 Acute bronchitis, unspecified: Secondary | ICD-10-CM | POA: Diagnosis not present

## 2020-05-25 DIAGNOSIS — N9989 Other postprocedural complications and disorders of genitourinary system: Secondary | ICD-10-CM | POA: Diagnosis not present

## 2020-05-25 DIAGNOSIS — J440 Chronic obstructive pulmonary disease with acute lower respiratory infection: Secondary | ICD-10-CM | POA: Diagnosis not present

## 2020-05-25 DIAGNOSIS — J441 Chronic obstructive pulmonary disease with (acute) exacerbation: Secondary | ICD-10-CM | POA: Diagnosis not present

## 2020-05-25 DIAGNOSIS — T83518A Infection and inflammatory reaction due to other urinary catheter, initial encounter: Secondary | ICD-10-CM | POA: Diagnosis not present

## 2020-05-25 DIAGNOSIS — N390 Urinary tract infection, site not specified: Secondary | ICD-10-CM | POA: Diagnosis not present

## 2020-05-25 DIAGNOSIS — R6520 Severe sepsis without septic shock: Secondary | ICD-10-CM | POA: Diagnosis not present

## 2020-05-25 DIAGNOSIS — N179 Acute kidney failure, unspecified: Secondary | ICD-10-CM | POA: Diagnosis not present

## 2020-05-25 DIAGNOSIS — G9341 Metabolic encephalopathy: Secondary | ICD-10-CM | POA: Diagnosis not present

## 2020-05-25 DIAGNOSIS — A4152 Sepsis due to Pseudomonas: Secondary | ICD-10-CM | POA: Diagnosis not present

## 2020-05-25 LAB — CBC WITH DIFFERENTIAL/PLATELET
Basophils Absolute: 0.1 10*3/uL (ref 0.0–0.2)
Basos: 1 %
EOS (ABSOLUTE): 0.2 10*3/uL (ref 0.0–0.4)
Eos: 3 %
Hematocrit: 39.2 % (ref 37.5–51.0)
Hemoglobin: 13.1 g/dL (ref 13.0–17.7)
Immature Grans (Abs): 0 10*3/uL (ref 0.0–0.1)
Immature Granulocytes: 0 %
Lymphocytes Absolute: 0.7 10*3/uL (ref 0.7–3.1)
Lymphs: 10 %
MCH: 30.8 pg (ref 26.6–33.0)
MCHC: 33.4 g/dL (ref 31.5–35.7)
MCV: 92 fL (ref 79–97)
Monocytes Absolute: 0.7 10*3/uL (ref 0.1–0.9)
Monocytes: 9 %
Neutrophils Absolute: 5.8 10*3/uL (ref 1.4–7.0)
Neutrophils: 77 %
Platelets: 290 10*3/uL (ref 150–450)
RBC: 4.25 x10E6/uL (ref 4.14–5.80)
RDW: 15 % (ref 11.6–15.4)
WBC: 7.4 10*3/uL (ref 3.4–10.8)

## 2020-05-25 LAB — CMP14+EGFR
ALT: 16 IU/L (ref 0–44)
AST: 18 IU/L (ref 0–40)
Albumin/Globulin Ratio: 1.2 (ref 1.2–2.2)
Albumin: 3.8 g/dL (ref 3.7–4.7)
Alkaline Phosphatase: 124 IU/L — ABNORMAL HIGH (ref 44–121)
BUN/Creatinine Ratio: 18 (ref 10–24)
BUN: 14 mg/dL (ref 8–27)
Bilirubin Total: <0.2 mg/dL (ref 0.0–1.2)
CO2: 27 mmol/L (ref 20–29)
Calcium: 9.5 mg/dL (ref 8.6–10.2)
Chloride: 98 mmol/L (ref 96–106)
Creatinine, Ser: 0.79 mg/dL (ref 0.76–1.27)
GFR calc Af Amer: 102 mL/min/{1.73_m2} (ref >59)
GFR calc non Af Amer: 88 mL/min/{1.73_m2} (ref >59)
Globulin, Total: 3.1 g/dL (ref 1.5–4.5)
Glucose: 165 mg/dL — ABNORMAL HIGH (ref 65–99)
Potassium: 4.7 mmol/L (ref 3.5–5.2)
Sodium: 138 mmol/L (ref 134–144)
Total Protein: 6.9 g/dL (ref 6.0–8.5)

## 2020-05-28 ENCOUNTER — Other Ambulatory Visit: Payer: Self-pay

## 2020-05-28 ENCOUNTER — Encounter: Payer: Self-pay | Admitting: Nurse Practitioner

## 2020-05-28 ENCOUNTER — Ambulatory Visit (INDEPENDENT_AMBULATORY_CARE_PROVIDER_SITE_OTHER): Payer: Medicare Other | Admitting: Nurse Practitioner

## 2020-05-28 VITALS — BP 129/58 | HR 102 | Ht 66.0 in

## 2020-05-28 DIAGNOSIS — F17210 Nicotine dependence, cigarettes, uncomplicated: Secondary | ICD-10-CM

## 2020-05-28 DIAGNOSIS — E1142 Type 2 diabetes mellitus with diabetic polyneuropathy: Secondary | ICD-10-CM | POA: Diagnosis not present

## 2020-05-28 DIAGNOSIS — E039 Hypothyroidism, unspecified: Secondary | ICD-10-CM | POA: Diagnosis not present

## 2020-05-28 DIAGNOSIS — E782 Mixed hyperlipidemia: Secondary | ICD-10-CM | POA: Diagnosis not present

## 2020-05-28 NOTE — Progress Notes (Signed)
Endocrinology Consult Note       05/28/2020, 11:50 AM   Subjective:    Patient ID: John [name], male    DOB: [birth year].  John [name] is being seen in consultation for management of currently uncontrolled symptomatic diabetes requested by  John Balloon, John [name].   Past Medical History:  Diagnosis Date  . Adenocarcinoma of lung, right ([place]) 04/18/2016  . Anxiety   . Arthritis   . Asthma   . BPH (benign prostatic hyperplasia)    with urinary retention 02/06/20  . CHF (congestive heart failure) ([place])   . COPD (chronic obstructive pulmonary disease) ([place])   . Depression   . Diabetes mellitus without complication (HCC)    no meds  . Diabetes mellitus, type II ([place])   . DM (diabetes mellitus) ([place]) 07/09/2016  . Dyspnea   . History of kidney stones   . Hyperlipidemia   . Hypertension   . Hypertension   . Hypothyroidism   . Macular degeneration   . Neuropathy   . Non-small cell lung cancer, right ([place]) 04/18/2016  . Peripheral vascular disease ([place])   . Prostatitis   . Pulmonary nodule, left 07/16/2016  . Sleep apnea    cpap    Past Surgical History:  Procedure Laterality Date  . ABDOMINAL AORTOGRAM W/LOWER EXTREMITY Left 02/06/2020   Procedure: ABDOMINAL AORTOGRAM W/LOWER EXTREMITY;  Surgeon: Lorretta Harp, MD;  Location: [facility];  Service: Cardiovascular;  Laterality: Left;  . CATARACT EXTRACTION, BILATERAL Bilateral   . NO PAST SURGERIES    . PORTACATH PLACEMENT Left 06/13/2016   Procedure: INSERTION PORT-A-CATH;  Surgeon: Aviva Signs, MD;  Location: [facility];  Service: General;  Laterality: Left;  Marland Kitchen VIDEO BRONCHOSCOPY WITH ENDOBRONCHIAL NAVIGATION N/A 05/28/2016   Procedure: VIDEO BRONCHOSCOPY WITH ENDOBRONCHIAL NAVIGATION;  Surgeon: [name], MD;  Location: [facility];  Service: Thoracic;  Laterality: N/A;  . VIDEO BRONCHOSCOPY WITH ENDOBRONCHIAL ULTRASOUND N/A 05/28/2016    Procedure: VIDEO BRONCHOSCOPY WITH ENDOBRONCHIAL ULTRASOUND;  Surgeon: [name], MD;  Location: [facility];  Service: Thoracic;  Laterality: N/A;    Social History   Socioeconomic History  . Marital status: Divorced    Spouse name: Not on file  . Number of children: 5  . Years of education: 10  . Highest education level: 10th grade  Occupational History  . Occupation: Retired  Tobacco Use  . Smoking status: Current Every Day Smoker    Packs/day: 0.50    Years: 55.00    Pack years: 27.50    Types: Cigarettes    Start date: 03/11/1961  . Smokeless tobacco: Never Used  . Tobacco comment: 1/2 pack to 1 pack per day 12/14/19  Vaping Use  . Vaping Use: Never used  Substance and Sexual Activity  . Alcohol use: No  . Drug use: No  . Sexual activity: Yes    Birth control/protection: None  Other Topics Concern  . Not on file  Social History Narrative  . Not on file   Social Determinants of Health   Financial Resource Strain: Not on file  Food Insecurity: Not on file  Transportation Needs: Not on file  Physical Activity: Not on  file  Stress: Not on file  Social Connections: Not on file    Family History  Problem Relation Age of Onset  . Hypertension Mother   . Diabetes Father   . Heart disease Father   . Stroke Father   . Hypertension Sister     Outpatient Encounter Medications as of 05/28/2020  Medication Sig  . acetaminophen (TYLENOL) 500 MG tablet Take 1,000 mg by mouth at bedtime as needed for moderate pain.  Marland Kitchen acetaminophen (TYLENOL) 650 MG CR tablet Take 650 mg by mouth every 8 (eight) hours as needed for pain.  Marland Kitchen albuterol (VENTOLIN HFA) 108 (90 Base) MCG/ACT inhaler Inhale 2 puffs into the lungs every 4 (four) hours as needed for wheezing or shortness of breath.  Marland Kitchen aspirin EC 81 MG tablet Take 81 mg by mouth daily.  . Blood Glucose Monitoring Suppl (ONETOUCH VERIO REFLECT) w/Device KIT Use to test blood sugars daily as directed. DX: E11.9  . cloNIDine  (CATAPRES) 0.1 MG tablet TAKE 1 TABLET (0.1 MG TOTAL) BY MOUTH 3 (THREE) TIMES DAILY.  Marland Kitchen dutasteride (AVODART) 0.5 MG capsule TAKE 1 CAPSULE BY MOUTH EVERY DAY (Patient taking differently: Take 0.5 mg by mouth daily.)  . fluticasone (FLONASE) 50 MCG/ACT nasal spray Place 1 spray into both nostrils daily as needed for allergies or rhinitis.  . furosemide (LASIX) 20 MG tablet Take 1 tablet (20 mg total) by mouth 2 (two) times daily.  Marland Kitchen gabapentin (NEURONTIN) 300 MG capsule Take 1 capsule (300 mg total) by mouth 3 (three) times daily. (Patient taking differently: Take 300-600 mg by mouth See admin instructions. Take 1 capsule (300 mg) by mouth in the morning & take 2 capsules (600 mg) by mouth in the evening.)  . glucose blood (ONETOUCH VERIO) test strip Use to test blood sugars daily as directed. DX: E11.9  . levothyroxine (SYNTHROID) 75 MCG tablet Take 1 tablet (75 mcg total) by mouth daily.  Marland Kitchen linaclotide (LINZESS) 72 MCG capsule Take 1 capsule (72 mcg total) by mouth daily before breakfast. (Patient taking differently: Take 72 mcg by mouth daily as needed (constipation.).)  . losartan (COZAAR) 50 MG tablet Take 1 tablet (50 mg total) by mouth daily.  . meloxicam (MOBIC) 7.5 MG tablet Take 1 tablet (7.5 mg total) by mouth daily as needed for pain (back pain).  . metFORMIN (GLUCOPHAGE-XR) 500 MG 24 hr tablet Take 1 tablet (500 mg total) by mouth daily with breakfast.  . metoprolol succinate (TOPROL-XL) 25 MG 24 hr tablet Take 1.5 tablets (37.5 mg total) by mouth daily. Take with or immediately following a meal.  . nitrofurantoin (MACRODANTIN) 50 MG capsule Take 1 capsule (50 mg total) by mouth at bedtime.  . ondansetron (ZOFRAN ODT) 4 MG disintegrating tablet Take 1 tablet (4 mg total) by mouth every 8 (eight) hours as needed for nausea or vomiting.  Glory Rosebush Delica Lancets 34L MISC Use to test blood sugars daily as directed. DX: E11.9  . pravastatin (PRAVACHOL) 40 MG tablet TAKE 1 TABLET (40 MG  TOTAL) BY MOUTH ON MONDAYS , WEDNESDAYS, AND FRIDAYS AS DIRECTED (Patient taking differently: Take 40 mg by mouth every Monday, Wednesday, and Friday.)  . Semaglutide (RYBELSUS) 7 MG TABS Take 7 mg by mouth daily.  . tamsulosin (FLOMAX) 0.4 MG CAPS capsule Take 2 capsules (0.8 mg total) by mouth daily after supper.  . TRELEGY ELLIPTA 100-62.5-25 MCG/INH AEPB Inhale 1 puff into the lungs daily.  Marland Kitchen zolpidem (AMBIEN) 5 MG tablet Take 5 mg by  mouth at bedtime as needed for sleep.   No facility-administered encounter medications on file as of 05/28/2020.    ALLERGIES: Allergies  Allergen Reactions  . Jardiance [Empagliflozin] Other (See Comments)    FEELS SLUGGISH, TIRED  . Lopressor [Metoprolol] Other (See Comments)    Fatigue    VACCINATION STATUS: Immunization History  Administered Date(s) Administered  . Fluad Quad(high Dose 65+) 01/19/2019, 02/01/2020  . Influenza, High Dose Seasonal PF 12/28/2015  . Influenza,inj,Quad PF,6+ Mos 01/23/2017  . Moderna Sars-Covid-2 Vaccination 05/10/2019, 06/07/2019, 01/31/2020  . Pneumococcal Conjugate-13 06/17/2017  . Pneumococcal Polysaccharide-23 04/25/2019  . Tdap 05/17/2019  . Zoster 07/10/2014    Diabetes He presents for his initial diabetic visit. He has type 2 diabetes mellitus. Onset time: Was diagnosed at approx age of 29. His disease course has been stable. There are no hypoglycemic associated symptoms. Pertinent negatives for hypoglycemia include no nervousness/anxiousness or tremors. Associated symptoms include blurred vision and foot paresthesias. Pertinent negatives for diabetes include no fatigue, no polydipsia, no polyphagia and no weight loss. There are no hypoglycemic complications. Symptoms are stable. Diabetic complications include heart disease, nephropathy and PVD. Risk factors for coronary artery disease include diabetes mellitus, dyslipidemia, hypertension, male sex, sedentary lifestyle and tobacco exposure. Current diabetic  treatment includes oral agent (dual therapy). He is compliant with treatment most of the time. His weight is stable. He is following a generally healthy diet. When asked about meal planning, he reported none. He has not had a previous visit with a dietitian. He rarely participates in exercise. His breakfast blood glucose range is generally 130-140 mg/dl. (He presents today for his consultation with no meter or logs to review.  He checks his glucose every morning and reports readings between 130-160.  His most recent A1c was 6.7% on 05/06/20.  He reports he does not have a set eating routine, will eat when he is hungry but typically has a light breakfast and lunch and heavy dinner.  He does occasionally have a soda or ice cream.  He denies any s/s of hypoglycemia.) An ACE inhibitor/angiotensin II receptor blocker is being taken. He sees a podiatrist.Eye exam is current.  Hyperlipidemia This is a chronic problem. The current episode started more than 1 year ago. The problem is uncontrolled. Recent lipid tests were reviewed and are variable. Exacerbating diseases include chronic renal disease, diabetes and hypothyroidism. Factors aggravating his hyperlipidemia include fatty foods and beta blockers. Current antihyperlipidemic treatment includes statins. The current treatment provides moderate improvement of lipids. There are no compliance problems.  Risk factors for coronary artery disease include diabetes mellitus, dyslipidemia, hypertension, male sex and a sedentary lifestyle.  Hypertension This is a chronic problem. The current episode started more than 1 year ago. The problem has been gradually improving since onset. The problem is controlled. Associated symptoms include blurred vision. Pertinent negatives include no palpitations. Agents associated with hypertension include thyroid hormones. Risk factors for coronary artery disease include diabetes mellitus, dyslipidemia, sedentary lifestyle, smoking/tobacco  exposure and male gender. Past treatments include angiotensin blockers, diuretics, beta blockers and alpha 1 blockers. The current treatment provides moderate improvement. There are no compliance problems.  Hypertensive end-organ damage includes kidney disease, CAD/MI and PVD. Identifiable causes of hypertension include chronic renal disease, sleep apnea and a thyroid problem.  Thyroid Problem Presents for initial visit. Patient reports no anxiety, cold intolerance, constipation, depressed mood, diarrhea, dry skin, fatigue, heat intolerance, palpitations, tremors or weight loss. The symptoms have been stable. Past treatments include levothyroxine. The treatment  provided moderate relief. His past medical history is significant for diabetes and hyperlipidemia. There are no known risk factors.     Review of systems  Constitutional: + Minimally fluctuating body weight,  current Body mass index is 27.57 kg/m. , no fatigue, no subjective hyperthermia, no subjective hypothermia Eyes: no blurry vision, no xerophthalmia ENT: no sore throat, no nodules palpated in throat, no dysphagia/odynophagia, no hoarseness Cardiovascular: no chest pain, no shortness of breath, no palpitations, no leg swelling Respiratory: no cough, no shortness of breath Gastrointestinal: no nausea/vomiting/diarrhea Genitourinary: + difficulty passing urine (having TURP for BPH on Thursday) Musculoskeletal: no muscle/joint aches Skin: no rashes, no hyperemia Neurological: no tremors, no numbness, no tingling, no dizziness, + burning pain to bilateral feet (has vascular surgeon- needs revascularization surgery soon) Psychiatric: no depression, no anxiety  Objective:     BP (!) 129/58 (BP Location: Left Arm, Patient Position: Sitting)   Pulse (!) 102   Ht 5' 6"  (1.676 m)   BMI 27.57 kg/m   Wt Readings from Last 3 Encounters:  05/24/20 170 lb 12.8 oz (77.5 kg)  05/24/20 170 lb 6.4 oz (77.3 kg)  05/07/20 169 lb 12.1 oz (77 kg)      BP Readings from Last 3 Encounters:  05/28/20 (!) 129/58  05/24/20 (!) 116/54  05/24/20 140/73     Physical Exam- Limited  Constitutional:  Body mass index is 27.57 kg/m. , not in acute distress, normal state of mind Eyes:  EOMI, no exophthalmos Neck: Supple Cardiovascular: RRR, no murmers, rubs, or gallops, no edema Respiratory: Adequate breathing efforts, no crackles, rales, rhonchi, or wheezing Musculoskeletal: no gross deformities, strength intact in all four extremities, no gross restriction of joint movements Skin:  no rashes, no hyperemia Neurological: no tremor with outstretched hands    CMP ( most recent) CMP     Component Value Date/Time   NA 138 05/24/2020 1238   K 4.7 05/24/2020 1238   CL 98 05/24/2020 1238   CO2 27 05/24/2020 1238   GLUCOSE 165 (H) 05/24/2020 1238   GLUCOSE 220 (H) 05/08/2020 0514   BUN 14 05/24/2020 1238   CREATININE 0.79 05/24/2020 1238   CALCIUM 9.5 05/24/2020 1238   PROT 6.9 05/24/2020 1238   ALBUMIN 3.8 05/24/2020 1238   AST 18 05/24/2020 1238   ALT 16 05/24/2020 1238   ALKPHOS 124 (H) 05/24/2020 1238   BILITOT <0.2 05/24/2020 1238   GFRNONAA 88 05/24/2020 1238   GFRNONAA >60 05/08/2020 0514   GFRAA 102 05/24/2020 1238     Diabetic Labs (most recent): Lab Results  Component Value Date   HGBA1C 6.7 (H) 05/06/2020   HGBA1C 6.8 03/20/2020   HGBA1C 7.5 (H) 12/16/2019     Lipid Panel ( most recent) Lipid Panel     Component Value Date/Time   CHOL 136 04/25/2019 1608   TRIG 175 (H) 04/25/2019 1608   HDL 28 (L) 04/25/2019 1608   CHOLHDL 4.9 04/25/2019 1608   LDLCALC 78 04/25/2019 1608   LABVLDL 30 04/25/2019 1608      Lab Results  Component Value Date   TSH 1.503 03/23/2020   TSH 1.620 03/20/2020   TSH 0.752 03/03/2020   TSH 1.706 11/24/2019   TSH 1.066 06/28/2019   TSH 1.620 04/25/2019   TSH 1.104 02/22/2019   TSH 2.210 10/07/2018   TSH 3.104 08/02/2018   TSH 6.390 (H) 03/22/2018   FREET4 1.03 02/20/2017            Assessment & Plan:  1) Type 2 diabetes mellitus with diabetic polyneuropathy, without long-term current use of insulin (HCC)  - John [name] has currently uncontrolled symptomatic type 2 DM since 75 years of age, with most recent A1c of 6.7 %.   He presents today for his consultation with no meter or logs to review.  He checks his glucose every morning and reports readings between 130-160.  His most recent A1c was 6.7% on 05/06/20.  He reports he does not have a set eating routine, will eat when he is hungry but typically has a light breakfast and lunch and heavy dinner.  He does occasionally have a soda or ice cream.  He denies any s/s of hypoglycemia.  He requested this referral.  -Recent labs reviewed.  - I had a long discussion with him about the progressive nature of diabetes and the pathology behind its complications. -his diabetes is complicated by CKD stage 2, PAD, and neuropathy and he remains at a high risk for more acute and chronic complications which include CAD, CVA, and retinopathy. These are all discussed in detail with him.  - I have counseled him on diet  and weight management by adopting a carbohydrate restricted/protein rich diet. Patient is encouraged to switch to unprocessed or minimally processed complex starch and increased protein intake (animal or plant source), fruits, and vegetables. -  he is advised to stick to a routine mealtimes to eat 3 meals a day and avoid unnecessary snacks (to snack only to correct hypoglycemia).   - he acknowledges that there is a room for improvement in his food and drink choices. - Suggestion is made for him to avoid simple carbohydrates  from his diet including Cakes, Sweet Desserts, Ice Cream, Soda (diet and regular), Sweet Tea, Candies, Chips, Cookies, Store Bought Juices, Alcohol in Excess of  1-2 drinks a day, Artificial Sweeteners, Coffee Creamer, and "Sugar-free" Products. This will help patient to have more stable  blood glucose profile and potentially avoid unintended weight gain.  - he will be scheduled with John [name], John [name], John [name] for diabetes education.  - I have approached him with the following individualized plan to manage  his diabetes and patient agrees:   -He is advised to continue Metformin 500 mg ER daily with breakfast and Rybelsus 7 mg po daily.  -he is encouraged to start monitoring glucose twice daily, before breakfast and before bed, to log their readings on the clinic sheets provided, and bring them to review at follow up appointment in 3 months.  - he is encouraged to call clinic for blood glucose levels less than 70 or above 300 mg /dl.  - Specific targets for  A1c;  LDL, HDL,  and Triglycerides were discussed with the patient.  2) Blood Pressure /Hypertension:  his blood pressure is controlled to target.   he is advised to continue his current medications including Clonidine 0.1 mg po TID, Lasix 20 mg po twice daily, and Metoprolol 37.5 mg po daily.  3) Lipids/Hyperlipidemia:    Review of his recent lipid panel from 04/25/19 showed controlled  LDL at 78 and elevated triglycerides of 175 .  he  is advised to continue Pravachol 40 mg daily at bedtime.  Side effects and precautions discussed with him.  4)  Weight/Diet:  his Body mass index is 27.57 kg/m.  -  he is is not a candidate for major weight loss. I discussed with him the fact that loss of 5 - 10% of his  current body weight will have  the most impact on his diabetes management.  Exercise, and detailed carbohydrates information provided  -  detailed on discharge instructions.  5) Hypothyroidism: Long-standing diagnosis for him.  He is on Levothyroxine 75 mcg po daily.  His recent thyroid function tests from 03/23/20 indicate appropriate hormone replacement.  He is advised to continue current dose.     - The correct intake of thyroid hormone (Levothyroxine, Synthroid), is on empty stomach first thing in the morning, with  water, separated by at least 30 minutes from breakfast and other medications,  and separated by more than 4 hours from calcium, iron, multivitamins, acid reflux medications (PPIs).  - This medication is a life-long medication and will be needed to correct thyroid hormone imbalances for the rest of your life.  The dose may change from time to time, based on thyroid blood work.  - It is extremely important to be consistent taking this medication, near the same time each morning.  -AVOID TAKING PRODUCTS CONTAINING BIOTIN (commonly found in Hair, Skin, Nails vitamins) AS IT INTERFERES WITH THE VALIDITY OF THYROID FUNCTION BLOOD TESTS.  6) Chronic Care/Health Maintenance: -he is on ACEI/ARB and Statin medications and is encouraged to initiate and continue to follow up with Ophthalmology, Dentist, Podiatrist at least yearly or according to recommendations, and advised to [place]. I have recommended yearly flu vaccine and pneumonia vaccine at least every 5 years; moderate intensity exercise for up to 150 minutes weekly; and sleep for at least 7 hours a day.  - he is advised to maintain close follow up with John Balloon, John [name] for primary care needs, as well as his other providers for optimal and coordinated care.   - Time spent in this patient care: 60 min, of which > 50% was spent in counseling him about his diabetes and the rest reviewing his blood glucose logs, discussing his hypoglycemia and hyperglycemia episodes, reviewing his current and previous labs/studies (including abstraction from other facilities) and medications doses and developing a long term treatment plan based on the latest standards of care/guidelines; and documenting his care.    Please refer to Patient Instructions for Blood Glucose Monitoring and Insulin/Medications Dosing Guide" in media tab for additional information. Please also refer to "Patient Self Inventory" in the Media tab for reviewed elements of pertinent patient  history.  John [name] participated in the discussions, expressed understanding, and voiced agreement with the above plans.  All questions were answered to his satisfaction. he is encouraged to contact clinic should he have any questions or concerns prior to his return visit.   Follow up plan: - Return in about 3 months (around 08/25/2020) for Diabetes follow up with A1c in office, No previsit labs, Bring glucometer and logs.  Rayetta Pigg, [facility] [address] [place], [place] [address] Phone: [phone] Fax: [phone]  05/28/2020, 11:50 AM

## 2020-05-28 NOTE — Patient Instructions (Signed)
John [name]  05/28/2020     @PREFPERIOPPHARMACY @   Your procedure is scheduled on  05/31/2020.    Report to [facility] at  [place].    Call this number if you have problems the morning of surgery:  [phone]   Remember:  Do not eat or drink after midnight.                        Take these medicines the morning of surgery with A SIP OF WATER  Clonidine, avodart, gabapentin, levothyroxine, mobic (if needed), metoprolol, zofran (if needed).    Use your inhalers before you come and bring them with you.    DO NOT take any medications for diabetes the morning of your procedure. If your glucose is over 300 the morning, call [phone] for instructions.  If your glucose is 70 or below the morning of your procedure, drink 1/2 cup of clear juice and recheck your glucose in 15 minutes. If your glucose is still 70 or below, call [phone] for instructions.   Shower with CHG the night before and the morning of your procedure. DO NOT put CHG on your face, hair, or genitals.  Place clean sheets on your bed the night before your procedure and DO NOT sleep with pets this night.  After both your showers, dry off with a clean towel and put on clean clothes. Brush your teeth before you come th the hospital the morning of your procedure.       Do not wear jewelry, make-up or nail polish.  Do not wear lotions, powders, or perfumes, or deodorant.  Do not shave 48 hours prior to surgery.  Men may shave face and neck.  Do not bring valuables to the hospital.  [facility] is not responsible for any belongings or valuables.   Contacts, dentures or bridgework may not be worn into surgery.  Leave your suitcase in the car.  After surgery it may be brought to your room.  For patients admitted to the hospital, discharge time will be determined by your treatment team.  Patients discharged the day of surgery will not be allowed to drive home and must  have someone with them  for 24 hours.   Special instructions:  DO NOT SMOKE tobacco or vape the morning of your procedure.  Please read over the following fact sheets that you were given. Coughing and Deep Breathing, Surgical Site Infection Prevention, Anesthesia Post-op Instructions and Care and Recovery After Surgery       Transurethral Resection of the Prostate, Care After This sheet gives you information about how to care for yourself after your procedure. Your health care provider may also give you more specific instructions. If you have problems or questions, contact your health care provider. What can I expect after the procedure? After the procedure, it is common to have:  Mild pain in your lower abdomen.  Soreness or mild discomfort in your penis from having the catheter inserted during the procedure.  A feeling of urgency when you need to urinate.  A small amount of blood in your urine. You may notice some small blood clots in your urine. These are normal. Follow these instructions at home: Medicines  Take over-the-counter and prescription medicines only as told by your health care provider.  If you were prescribed an antibiotic medicine, take it as told by your health care provider. Do not stop taking the  antibiotic even if you start to feel better.  Ask your health care provider if the medicine prescribed to you: ? Requires you to avoid driving or using heavy machinery. ? Can cause constipation. You may need to take actions to prevent or treat constipation, such as:  Take over-the-counter or prescription medicines.  Eat foods that are high in fiber, such as fresh fruits and vegetables, whole grains, and beans.  Limit foods that are high in fat and processed sugars, such as fried or sweet foods.  Do not drive for 24 hours if you were given a sedative during your procedure. Activity  Return to your normal activities as told by your health care provider. Ask your health care provider what  activities are safe for you.  Do not lift anything that is heavier than 10 lb (4.5 kg), or the limit that you are told, for 3 weeks after the procedure or until your health care provider says that it is safe.  Avoid intense physical activity for as long as told by your health care provider.  Avoid sitting for a long time without moving. Get up and move around one or more times every few hours. This helps to prevent blood clots. You may increase your physical activity gradually as you start to feel better.   Lifestyle  Do not drink alcohol for as long as told by your health care provider. This is especially important if you are taking prescription pain medicines.  Do not engage in sexual activity until your health care provider says that you can do this. General instructions  Do not take baths, swim, or use a hot tub until your health care provider approves.  Drink enough fluid to keep your urine pale yellow.  Urinate as soon as you feel the need to. Do not try to hold your urine for long periods of time.  If your health care provider approves, you may take a stool softener for 2-3 weeks to prevent you from straining to have a bowel movement.  Wear compression stockings as told by your health care provider. These stockings help to prevent blood clots and reduce swelling in your legs.  Keep all follow-up visits as told by your health care provider. This is important.   Contact a health care provider if you have:  Difficulty urinating.  A fever.  Pain that gets worse or does not improve with medicine.  Blood in your urine that does not go away after 1 week of resting and drinking more fluids.  Swelling in your penis or testicles. Get help right away if:  You are unable to urinate.  You are having more blood clots in your urine instead of fewer.  You have: ? Large blood clots. ? A lot of blood in your urine. ? Pain in your back or lower abdomen. ? Pain or swelling in your  legs. ? Chills and you are shaking. ? Difficulty breathing or shortness of breath. Summary  After the procedure, it is common to have a small amount of blood in your urine.  Avoid heavy lifting and intense physical activity for as long as told by your health care provider.  Urinate as soon as you feel the need to. Do not try to hold your urine for long periods of time.  Keep all follow-up visits as told by your health care provider. This is important. This information is not intended to replace advice given to you by your health care provider. Make sure you discuss any  questions you have with your health care provider. Document Revised: 07/14/2018 Document Reviewed: 12/23/2017 Elsevier Patient Education  2021 [place] Anesthesia, Adult, Care After This sheet gives you information about how to care for yourself after your procedure. Your health care provider may also give you more specific instructions. If you have problems or questions, contact your health care provider. What can I expect after the procedure? After the procedure, the following side effects are common:  Pain or discomfort at the IV site.  Nausea.  Vomiting.  Sore throat.  Trouble concentrating.  Feeling cold or chills.  Feeling weak or tired.  Sleepiness and fatigue.  Soreness and body aches. These side effects can affect parts of the body that were not involved in surgery. Follow these instructions at home: For the time period you were told by your health care provider:  Rest.  Do not participate in activities where you could fall or become injured.  Do not drive or use machinery.  Do not drink alcohol.  Do not take sleeping pills or medicines that cause drowsiness.  Do not make important decisions or sign legal documents.  Do not take care of children on your own.   Eating and drinking  Follow any instructions from your health care provider about eating or drinking  restrictions.  When you feel hungry, start by eating small amounts of foods that are soft and easy to digest (bland), such as toast. Gradually return to your regular diet.  Drink enough fluid to keep your urine pale yellow.  If you vomit, rehydrate by drinking water, juice, or clear broth. General instructions  If you have sleep apnea, surgery and certain medicines can increase your risk for breathing problems. Follow instructions from your health care provider about wearing your sleep device: ? Anytime you are sleeping, including during daytime naps. ? While taking prescription pain medicines, sleeping medicines, or medicines that make you drowsy.  Have a responsible adult stay with you for the time you are told. It is important to have someone help care for you until you are awake and alert.  Return to your normal activities as told by your health care provider. Ask your health care provider what activities are safe for you.  Take over-the-counter and prescription medicines only as told by your health care provider.  If you smoke, do not smoke without supervision.  Keep all follow-up visits as told by your health care provider. This is important. Contact a health care provider if:  You have nausea or vomiting that does not get better with medicine.  You cannot eat or drink without vomiting.  You have pain that does not get better with medicine.  You are unable to pass urine.  You develop a skin rash.  You have a fever.  You have redness around your IV site that gets worse. Get help right away if:  You have difficulty breathing.  You have chest pain.  You have blood in your urine or stool, or you vomit blood. Summary  After the procedure, it is common to have a sore throat or nausea. It is also common to feel tired.  Have a responsible adult stay with you for the time you are told. It is important to have someone help care for you until you are awake and  alert.  When you feel hungry, start by eating small amounts of foods that are soft and easy to digest (bland), such as toast. Gradually return to your regular diet.  Drink enough fluid to keep your urine pale yellow.  Return to your normal activities as told by your health care provider. Ask your health care provider what activities are safe for you. This information is not intended to replace advice given to you by your health care provider. Make sure you discuss any questions you have with your health care provider. Document Revised: 12/08/2019 Document Reviewed: 07/07/2019 Elsevier Patient Education  2021 [employer].

## 2020-05-28 NOTE — Patient Instructions (Signed)
Diabetes Mellitus and Nutrition, Adult When you have diabetes, or diabetes mellitus, it is very important to have healthy eating habits because your blood sugar (glucose) levels are greatly affected by what you eat and drink. Eating healthy foods in the right amounts, at about the same times every day, can help you:  Control your blood glucose.  Lower your risk of heart disease.  Improve your blood pressure.  Reach or maintain a healthy weight. What can affect my meal plan? Every person with diabetes is different, and each person has different needs for a meal plan. Your health care provider may recommend that you work with a dietitian to make a meal plan that is best for you. Your meal plan may vary depending on factors such as:  The calories you need.  The medicines you take.  Your weight.  Your blood glucose, blood pressure, and cholesterol levels.  Your activity level.  Other health conditions you have, such as heart or kidney disease. How do carbohydrates affect me? Carbohydrates, also called carbs, affect your blood glucose level more than any other type of food. Eating carbs naturally raises the amount of glucose in your blood. Carb counting is a method for keeping track of how many carbs you eat. Counting carbs is important to keep your blood glucose at a healthy level, especially if you use insulin or take certain oral diabetes medicines. It is important to know how many carbs you can safely have in each meal. This is different for every person. Your dietitian can help you calculate how many carbs you should have at each meal and for each snack. How does alcohol affect me? Alcohol can cause a sudden decrease in blood glucose (hypoglycemia), especially if you use insulin or take certain oral diabetes medicines. Hypoglycemia can be a life-threatening condition. Symptoms of hypoglycemia, such as sleepiness, dizziness, and confusion, are similar to symptoms of having too much  alcohol.  Do not drink alcohol if: ? Your health care provider tells you not to drink. ? You are pregnant, may be pregnant, or are planning to become pregnant.  If you drink alcohol: ? Do not drink on an empty stomach. ? Limit how much you use to:  0-1 drink a day for women.  0-2 drinks a day for men. ? Be aware of how much alcohol is in your drink. In the [place] one drink equals one 12 oz bottle of beer (355 mL), one 5 oz glass of wine (148 mL), or one 1 oz glass of hard liquor (44 mL). ? Keep yourself hydrated with water, diet soda, or unsweetened iced tea.  Keep in mind that regular soda, juice, and other mixers may contain a lot of sugar and must be counted as carbs. What are tips for following this plan? Reading food labels  Start by checking the serving size on the "Nutrition Facts" label of packaged foods and drinks. The amount of calories, carbs, fats, and other nutrients listed on the label is based on one serving of the item. Many items contain more than one serving per package.  Check the total grams (g) of carbs in one serving. You can calculate the number of servings of carbs in one serving by dividing the total carbs by 15. For example, if a food has 30 g of total carbs per serving, it would be equal to 2 servings of carbs.  Check the number of grams (g) of saturated fats and trans fats in one serving. Choose foods that have   a low amount or none of these fats.  Check the number of milligrams (mg) of salt (sodium) in one serving. Most people should limit total sodium intake to less than 2,300 mg per day.  Always check the nutrition information of foods labeled as "low-fat" or "nonfat." These foods may be higher in added sugar or refined carbs and should be avoided.  Talk to your dietitian to identify your daily goals for nutrients listed on the label. Shopping  Avoid buying canned, pre-made, or processed foods. These foods tend to be high in fat, sodium, and added  sugar.  Shop around the outside edge of the grocery store. This is where you will most often find fresh fruits and vegetables, bulk grains, fresh meats, and fresh dairy. Cooking  Use low-heat cooking methods, such as baking, instead of high-heat cooking methods like deep frying.  Cook using healthy oils, such as olive, canola, or sunflower oil.  Avoid cooking with butter, cream, or high-fat meats. Meal planning  Eat meals and snacks regularly, preferably at the same times every day. Avoid going long periods of time without eating.  Eat foods that are high in fiber, such as fresh fruits, vegetables, beans, and whole grains. Talk with your dietitian about how many servings of carbs you can eat at each meal.  Eat 4-6 oz (112-168 g) of lean protein each day, such as lean meat, chicken, fish, eggs, or tofu. One ounce (oz) of lean protein is equal to: ? 1 oz (28 g) of meat, chicken, or fish. ? 1 egg. ?  cup (62 g) of tofu.  Eat some foods each day that contain healthy fats, such as avocado, nuts, seeds, and fish.   What foods should I eat? Fruits Berries. Apples. Oranges. Peaches. Apricots. Plums. Grapes. Mango. Papaya. Pomegranate. Kiwi. Cherries. Vegetables Lettuce. Spinach. Leafy greens, including kale, chard, collard greens, and mustard greens. Beets. Cauliflower. Cabbage. Broccoli. Carrots. Green beans. Tomatoes. Peppers. Onions. Cucumbers. Brussels sprouts. Grains Whole grains, such as whole-wheat or whole-grain bread, crackers, tortillas, cereal, and pasta. Unsweetened oatmeal. Quinoa. Brown or wild rice. Meats and other proteins Seafood. Poultry without skin. Lean cuts of poultry and beef. Tofu. Nuts. Seeds. Dairy Low-fat or fat-free dairy products such as milk, yogurt, and cheese. The items listed above may not be a complete list of foods and beverages you can eat. Contact a dietitian for more information. What foods should I avoid? Fruits Fruits canned with  syrup. Vegetables Canned vegetables. Frozen vegetables with butter or cream sauce. Grains Refined white flour and flour products such as bread, pasta, snack foods, and cereals. Avoid all processed foods. Meats and other proteins Fatty cuts of meat. Poultry with skin. Breaded or fried meats. Processed meat. Avoid saturated fats. Dairy Full-fat yogurt, cheese, or milk. Beverages Sweetened drinks, such as soda or iced tea. The items listed above may not be a complete list of foods and beverages you should avoid. Contact a dietitian for more information. Questions to ask a health care provider  Do I need to meet with a diabetes educator?  Do I need to meet with a dietitian?  What number can I call if I have questions?  When are the best times to check my blood glucose? Where to find more information:  American Diabetes Association: diabetes.org  Academy of Nutrition and Dietetics: www.eatright.org  National Institute of Diabetes and Digestive and Kidney Diseases: www.niddk.nih.gov  Association of Diabetes Care and Education Specialists: www.diabeteseducator.org Summary  It is important to have healthy eating   habits because your blood sugar (glucose) levels are greatly affected by what you eat and drink.  A healthy meal plan will help you control your blood glucose and maintain a healthy lifestyle.  Your health care provider may recommend that you work with a dietitian to make a meal plan that is best for you.  Keep in mind that carbohydrates (carbs) and alcohol have immediate effects on your blood glucose levels. It is important to count carbs and to use alcohol carefully. This information is not intended to replace advice given to you by your health care provider. Make sure you discuss any questions you have with your health care provider. Document Revised: 03/01/2019 Document Reviewed: 03/01/2019 [employer] Patient Education  2021 [employer]  

## 2020-05-29 ENCOUNTER — Other Ambulatory Visit (HOSPITAL_COMMUNITY)
Admission: RE | Admit: 2020-05-29 | Discharge: 2020-05-29 | Disposition: A | Discharge status: Home / Self Care | Payer: Medicare Other | Source: Ambulatory Visit | Attending: Urology | Admitting: Urology

## 2020-05-29 ENCOUNTER — Encounter (HOSPITAL_COMMUNITY): Payer: Self-pay

## 2020-05-29 ENCOUNTER — Encounter (HOSPITAL_COMMUNITY)
Admission: RE | Admit: 2020-05-29 | Discharge: 2020-05-29 | Disposition: A | Discharge status: Home / Self Care | Payer: Medicare Other | Source: Ambulatory Visit | Attending: Urology | Admitting: Urology

## 2020-05-29 DIAGNOSIS — Z20822 Contact with and (suspected) exposure to covid-19: Secondary | ICD-10-CM | POA: Diagnosis not present

## 2020-05-29 DIAGNOSIS — Z01812 Encounter for preprocedural laboratory examination: Secondary | ICD-10-CM | POA: Diagnosis not present

## 2020-05-29 LAB — SARS CORONAVIRUS 2 (TAT 6-24 HRS): SARS Coronavirus 2: NEGATIVE

## 2020-05-30 DIAGNOSIS — N179 Acute kidney failure, unspecified: Secondary | ICD-10-CM | POA: Diagnosis not present

## 2020-05-30 DIAGNOSIS — B9620 Unspecified Escherichia coli [E. coli] as the cause of diseases classified elsewhere: Secondary | ICD-10-CM | POA: Diagnosis not present

## 2020-05-30 DIAGNOSIS — J441 Chronic obstructive pulmonary disease with (acute) exacerbation: Secondary | ICD-10-CM | POA: Diagnosis not present

## 2020-05-30 DIAGNOSIS — N390 Urinary tract infection, site not specified: Secondary | ICD-10-CM | POA: Diagnosis not present

## 2020-05-30 DIAGNOSIS — T83518A Infection and inflammatory reaction due to other urinary catheter, initial encounter: Secondary | ICD-10-CM | POA: Diagnosis not present

## 2020-05-30 DIAGNOSIS — R6520 Severe sepsis without septic shock: Secondary | ICD-10-CM | POA: Diagnosis not present

## 2020-05-30 DIAGNOSIS — J209 Acute bronchitis, unspecified: Secondary | ICD-10-CM | POA: Diagnosis not present

## 2020-05-30 DIAGNOSIS — G9341 Metabolic encephalopathy: Secondary | ICD-10-CM | POA: Diagnosis not present

## 2020-05-30 DIAGNOSIS — N9989 Other postprocedural complications and disorders of genitourinary system: Secondary | ICD-10-CM | POA: Diagnosis not present

## 2020-05-30 DIAGNOSIS — J440 Chronic obstructive pulmonary disease with acute lower respiratory infection: Secondary | ICD-10-CM | POA: Diagnosis not present

## 2020-05-30 DIAGNOSIS — A4152 Sepsis due to Pseudomonas: Secondary | ICD-10-CM | POA: Diagnosis not present

## 2020-05-30 LAB — URINE CULTURE

## 2020-05-31 ENCOUNTER — Encounter (HOSPITAL_COMMUNITY): Payer: Self-pay | Admitting: Urology

## 2020-05-31 ENCOUNTER — Other Ambulatory Visit: Payer: Self-pay

## 2020-05-31 ENCOUNTER — Observation Stay (HOSPITAL_COMMUNITY)
Admission: RE | Admit: 2020-05-31 | Discharge: 2020-06-01 | Disposition: A | Discharge status: Home / Self Care | Payer: Medicare Other | Source: Home / Self Care | Attending: Urology | Admitting: Urology

## 2020-05-31 ENCOUNTER — Ambulatory Visit (HOSPITAL_COMMUNITY): Payer: Medicare Other | Admitting: Anesthesiology

## 2020-05-31 ENCOUNTER — Encounter (HOSPITAL_COMMUNITY)
Admission: RE | Disposition: A | Discharge status: Home / Self Care | Payer: Self-pay | Source: Home / Self Care | Attending: Urology

## 2020-05-31 DIAGNOSIS — R000 Tachycardia, unspecified: Secondary | ICD-10-CM | POA: Diagnosis not present

## 2020-05-31 DIAGNOSIS — R6520 Severe sepsis without septic shock: Secondary | ICD-10-CM | POA: Diagnosis not present

## 2020-05-31 DIAGNOSIS — J9601 Acute respiratory failure with hypoxia: Secondary | ICD-10-CM | POA: Diagnosis not present

## 2020-05-31 DIAGNOSIS — G9341 Metabolic encephalopathy: Secondary | ICD-10-CM | POA: Diagnosis not present

## 2020-05-31 DIAGNOSIS — N138 Other obstructive and reflux uropathy: Secondary | ICD-10-CM | POA: Diagnosis not present

## 2020-05-31 DIAGNOSIS — E039 Hypothyroidism, unspecified: Secondary | ICD-10-CM | POA: Insufficient documentation

## 2020-05-31 DIAGNOSIS — Z79899 Other long term (current) drug therapy: Secondary | ICD-10-CM | POA: Insufficient documentation

## 2020-05-31 DIAGNOSIS — J449 Chronic obstructive pulmonary disease, unspecified: Secondary | ICD-10-CM | POA: Insufficient documentation

## 2020-05-31 DIAGNOSIS — N390 Urinary tract infection, site not specified: Secondary | ICD-10-CM | POA: Diagnosis not present

## 2020-05-31 DIAGNOSIS — J45909 Unspecified asthma, uncomplicated: Secondary | ICD-10-CM | POA: Diagnosis not present

## 2020-05-31 DIAGNOSIS — Z7982 Long term (current) use of aspirin: Secondary | ICD-10-CM | POA: Insufficient documentation

## 2020-05-31 DIAGNOSIS — I110 Hypertensive heart disease with heart failure: Secondary | ICD-10-CM | POA: Diagnosis not present

## 2020-05-31 DIAGNOSIS — Z85118 Personal history of other malignant neoplasm of bronchus and lung: Secondary | ICD-10-CM | POA: Insufficient documentation

## 2020-05-31 DIAGNOSIS — I5033 Acute on chronic diastolic (congestive) heart failure: Secondary | ICD-10-CM | POA: Diagnosis not present

## 2020-05-31 DIAGNOSIS — N179 Acute kidney failure, unspecified: Secondary | ICD-10-CM | POA: Diagnosis not present

## 2020-05-31 DIAGNOSIS — Z7984 Long term (current) use of oral hypoglycemic drugs: Secondary | ICD-10-CM | POA: Insufficient documentation

## 2020-05-31 DIAGNOSIS — N400 Enlarged prostate without lower urinary tract symptoms: Secondary | ICD-10-CM | POA: Diagnosis not present

## 2020-05-31 DIAGNOSIS — R338 Other retention of urine: Secondary | ICD-10-CM | POA: Insufficient documentation

## 2020-05-31 DIAGNOSIS — R339 Retention of urine, unspecified: Secondary | ICD-10-CM

## 2020-05-31 DIAGNOSIS — E1151 Type 2 diabetes mellitus with diabetic peripheral angiopathy without gangrene: Secondary | ICD-10-CM | POA: Diagnosis not present

## 2020-05-31 DIAGNOSIS — E1140 Type 2 diabetes mellitus with diabetic neuropathy, unspecified: Secondary | ICD-10-CM | POA: Insufficient documentation

## 2020-05-31 DIAGNOSIS — T8144XA Sepsis following a procedure, initial encounter: Secondary | ICD-10-CM | POA: Diagnosis not present

## 2020-05-31 DIAGNOSIS — I509 Heart failure, unspecified: Secondary | ICD-10-CM | POA: Insufficient documentation

## 2020-05-31 DIAGNOSIS — F17210 Nicotine dependence, cigarettes, uncomplicated: Secondary | ICD-10-CM | POA: Insufficient documentation

## 2020-05-31 DIAGNOSIS — A4152 Sepsis due to Pseudomonas: Secondary | ICD-10-CM | POA: Diagnosis not present

## 2020-05-31 DIAGNOSIS — N401 Enlarged prostate with lower urinary tract symptoms: Secondary | ICD-10-CM

## 2020-05-31 DIAGNOSIS — R4182 Altered mental status, unspecified: Secondary | ICD-10-CM | POA: Diagnosis not present

## 2020-05-31 DIAGNOSIS — Z20822 Contact with and (suspected) exposure to covid-19: Secondary | ICD-10-CM | POA: Diagnosis not present

## 2020-05-31 HISTORY — PX: TRANSURETHRAL RESECTION OF PROSTATE: SHX73

## 2020-05-31 LAB — CBC
HCT: 36 % — ABNORMAL LOW (ref 39.0–52.0)
Hemoglobin: 11.4 g/dL — ABNORMAL LOW (ref 13.0–17.0)
MCH: 31.5 pg (ref 26.0–34.0)
MCHC: 31.7 g/dL (ref 30.0–36.0)
MCV: 99.4 fL (ref 80.0–100.0)
Platelets: 355 10*3/uL (ref 150–400)
RBC: 3.62 MIL/uL — ABNORMAL LOW (ref 4.22–5.81)
RDW: 15.5 % (ref 11.5–15.5)
WBC: 5.4 10*3/uL (ref 4.0–10.5)
nRBC: 0 % (ref 0.0–0.2)

## 2020-05-31 LAB — BASIC METABOLIC PANEL
Anion gap: 10 (ref 5–15)
BUN: 16 mg/dL (ref 8–23)
CO2: 26 mmol/L (ref 22–32)
Calcium: 8.8 mg/dL — ABNORMAL LOW (ref 8.9–10.3)
Chloride: 103 mmol/L (ref 98–111)
Creatinine, Ser: 0.71 mg/dL (ref 0.61–1.24)
GFR, Estimated: >60 mL/min (ref >60)
Glucose, Bld: 100 mg/dL — ABNORMAL HIGH (ref 70–99)
Potassium: 4.1 mmol/L (ref 3.5–5.1)
Sodium: 139 mmol/L (ref 135–145)

## 2020-05-31 LAB — GLUCOSE, CAPILLARY
Glucose-Capillary: 139 mg/dL — ABNORMAL HIGH (ref 70–99)
Glucose-Capillary: 105 mg/dL — ABNORMAL HIGH (ref 70–99)
Glucose-Capillary: 103 mg/dL — ABNORMAL HIGH (ref 70–99)
Glucose-Capillary: 95 mg/dL (ref 70–99)
Glucose-Capillary: 83 mg/dL (ref 70–99)

## 2020-05-31 SURGERY — TURP (TRANSURETHRAL RESECTION OF PROSTATE)
Anesthesia: General | Site: Prostate

## 2020-05-31 MED ORDER — ASPIRIN EC 81 MG PO TBEC: 81.0000 mg | DELAYED_RELEASE_TABLET | Freq: Every day | ORAL | Status: DC

## 2020-05-31 MED ORDER — FENTANYL CITRATE (PF) 100 MCG/2ML IJ SOLN
INTRAMUSCULAR | Status: DC | PRN
  Administered 2020-05-31 (×2): 50 ug via INTRAVENOUS

## 2020-05-31 MED ORDER — GABAPENTIN 300 MG PO CAPS
300.0000 mg | ORAL_CAPSULE | Freq: Every morning | ORAL | Status: DC
  Filled 2020-05-31: qty 1

## 2020-05-31 MED ORDER — ALBUTEROL SULFATE HFA 108 (90 BASE) MCG/ACT IN AERS: 2.0000 | INHALATION_SPRAY | RESPIRATORY_TRACT | Status: DC | PRN

## 2020-05-31 MED ORDER — SODIUM CHLORIDE 0.9 % IR SOLN
Status: DC | PRN
  Administered 2020-05-31 (×9): 3000 mL

## 2020-05-31 MED ORDER — FLUTICASONE-UMECLIDIN-VILANT 100-62.5-25 MCG/INH IN AEPB: 1.0000 | INHALATION_SPRAY | Freq: Every day | RESPIRATORY_TRACT | Status: DC

## 2020-05-31 MED ORDER — MIDAZOLAM HCL 5 MG/5ML IJ SOLN
INTRAMUSCULAR | Status: DC | PRN
  Administered 2020-05-31 (×2): 1 mg via INTRAVENOUS

## 2020-05-31 MED ORDER — PROPOFOL 10 MG/ML IV BOLUS
INTRAVENOUS | Status: AC
  Filled 2020-05-31: qty 40

## 2020-05-31 MED ORDER — FUROSEMIDE 20 MG PO TABS
20.0000 mg | ORAL_TABLET | Freq: Two times a day (BID) | ORAL | Status: DC
  Administered 2020-05-31 – 2020-06-01 (×2): 20 mg via ORAL
  Filled 2020-05-31 (×2): qty 1

## 2020-05-31 MED ORDER — ONDANSETRON HCL 4 MG/2ML IJ SOLN: 4.0000 mg | INTRAMUSCULAR | Status: DC | PRN

## 2020-05-31 MED ORDER — ORAL CARE MOUTH RINSE: 15.0000 mL | Freq: Once | OROMUCOSAL | Status: AC

## 2020-05-31 MED ORDER — PHENYLEPHRINE 40 MCG/ML (10ML) SYRINGE FOR IV PUSH (FOR BLOOD PRESSURE SUPPORT)
PREFILLED_SYRINGE | INTRAVENOUS | Status: AC
  Filled 2020-05-31: qty 20

## 2020-05-31 MED ORDER — PRAVASTATIN SODIUM 40 MG PO TABS: 40.0000 mg | ORAL_TABLET | ORAL | Status: DC

## 2020-05-31 MED ORDER — WATER FOR IRRIGATION, STERILE IR SOLN
Status: DC | PRN
  Administered 2020-05-31: 500 mL

## 2020-05-31 MED ORDER — METOPROLOL SUCCINATE ER 25 MG PO TB24: 37.5000 mg | ORAL_TABLET | Freq: Every day | ORAL | Status: DC

## 2020-05-31 MED ORDER — DEXAMETHASONE SODIUM PHOSPHATE 10 MG/ML IJ SOLN
8.0000 mg | Freq: Once | INTRAMUSCULAR | Status: DC | PRN
  Filled 2020-05-31: qty 0.8

## 2020-05-31 MED ORDER — INSULIN ASPART 100 UNIT/ML ~~LOC~~ SOLN: 0.0000 [IU] | Freq: Three times a day (TID) | SUBCUTANEOUS | Status: DC

## 2020-05-31 MED ORDER — SODIUM CHLORIDE 0.9 % IV SOLN
INTRAVENOUS | Status: AC
  Filled 2020-05-31: qty 20

## 2020-05-31 MED ORDER — DIPHENHYDRAMINE HCL 12.5 MG/5ML PO ELIX: 12.5000 mg | ORAL_SOLUTION | Freq: Four times a day (QID) | ORAL | Status: DC | PRN

## 2020-05-31 MED ORDER — UMECLIDINIUM BROMIDE 62.5 MCG/INH IN AEPB
1.0000 | INHALATION_SPRAY | Freq: Every day | RESPIRATORY_TRACT | Status: DC
  Filled 2020-05-31: qty 7

## 2020-05-31 MED ORDER — FLUTICASONE PROPIONATE 50 MCG/ACT NA SUSP: 1.0000 | Freq: Every day | NASAL | Status: DC | PRN

## 2020-05-31 MED ORDER — CHLORHEXIDINE GLUCONATE 0.12 % MT SOLN
15.0000 mL | Freq: Once | OROMUCOSAL | Status: AC
  Administered 2020-05-31: 15 mL via OROMUCOSAL

## 2020-05-31 MED ORDER — FLUTICASONE FUROATE-VILANTEROL 100-25 MCG/INH IN AEPB
1.0000 | INHALATION_SPRAY | Freq: Every day | RESPIRATORY_TRACT | Status: DC
  Filled 2020-05-31: qty 28

## 2020-05-31 MED ORDER — LIDOCAINE HCL (CARDIAC) PF 100 MG/5ML IV SOSY
PREFILLED_SYRINGE | INTRAVENOUS | Status: DC | PRN
  Administered 2020-05-31: 50 mg via INTRAVENOUS

## 2020-05-31 MED ORDER — SODIUM CHLORIDE 0.9 % IR SOLN
3000.0000 mL | Status: DC
  Administered 2020-06-01: 3000 mL

## 2020-05-31 MED ORDER — FENTANYL CITRATE (PF) 100 MCG/2ML IJ SOLN
INTRAMUSCULAR | Status: AC
  Filled 2020-05-31: qty 2

## 2020-05-31 MED ORDER — CLONIDINE HCL 0.1 MG PO TABS
0.1000 mg | ORAL_TABLET | Freq: Three times a day (TID) | ORAL | Status: DC
Start: 2020-05-31 — End: 2020-06-01
  Administered 2020-05-31: 0.1 mg via ORAL
  Filled 2020-05-31: qty 1

## 2020-05-31 MED ORDER — SEVOFLURANE IN SOLN
RESPIRATORY_TRACT | Status: AC
  Filled 2020-05-31: qty 250

## 2020-05-31 MED ORDER — SODIUM CHLORIDE 0.9 % IV SOLN
INTRAVENOUS | Status: DC
  Administered 2020-05-31: 50 mL/h via INTRAVENOUS

## 2020-05-31 MED ORDER — DUTASTERIDE 0.5 MG PO CAPS
0.5000 mg | ORAL_CAPSULE | Freq: Every day | ORAL | Status: DC
  Filled 2020-05-31 (×5): qty 1

## 2020-05-31 MED ORDER — MIDAZOLAM HCL 2 MG/2ML IJ SOLN
INTRAMUSCULAR | Status: AC
  Filled 2020-05-31: qty 2

## 2020-05-31 MED ORDER — INSULIN ASPART 100 UNIT/ML ~~LOC~~ SOLN: 0.0000 [IU] | Freq: Every day | SUBCUTANEOUS | Status: DC

## 2020-05-31 MED ORDER — OXYCODONE-ACETAMINOPHEN 5-325 MG PO TABS
1.0000 | ORAL_TABLET | ORAL | Status: DC | PRN
Start: 2020-05-31 — End: 2020-06-01

## 2020-05-31 MED ORDER — PROPOFOL 10 MG/ML IV BOLUS
INTRAVENOUS | Status: DC | PRN
  Administered 2020-05-31: 140 mg via INTRAVENOUS

## 2020-05-31 MED ORDER — LOSARTAN POTASSIUM 50 MG PO TABS: 50.0000 mg | ORAL_TABLET | Freq: Every day | ORAL | Status: DC

## 2020-05-31 MED ORDER — HYDROMORPHONE HCL 1 MG/ML IJ SOLN
0.5000 mg | INTRAMUSCULAR | Status: DC | PRN
  Administered 2020-05-31: 1 mg via INTRAVENOUS
  Filled 2020-05-31: qty 1

## 2020-05-31 MED ORDER — SODIUM CHLORIDE 0.9 % IV SOLN
2.0000 g | INTRAVENOUS | Status: AC
  Administered 2020-05-31: 2 g via INTRAVENOUS

## 2020-05-31 MED ORDER — SEMAGLUTIDE 7 MG PO TABS: 7.0000 mg | ORAL_TABLET | Freq: Every day | ORAL | Status: DC

## 2020-05-31 MED ORDER — BELLADONNA ALKALOIDS-OPIUM 16.2-60 MG RE SUPP: 1.0000 | Freq: Four times a day (QID) | RECTAL | Status: DC | PRN

## 2020-05-31 MED ORDER — LACTATED RINGERS IV SOLN: INTRAVENOUS | Status: DC

## 2020-05-31 MED ORDER — LEVOTHYROXINE SODIUM 75 MCG PO TABS
75.0000 ug | ORAL_TABLET | Freq: Every day | ORAL | Status: DC
  Administered 2020-06-01: 75 ug via ORAL
  Filled 2020-05-31: qty 1

## 2020-05-31 MED ORDER — CHLORHEXIDINE GLUCONATE 0.12 % MT SOLN
OROMUCOSAL | Status: AC
  Administered 2020-05-31: 15 mL
  Filled 2020-05-31: qty 30

## 2020-05-31 MED ORDER — ZOLPIDEM TARTRATE 5 MG PO TABS: 5.0000 mg | ORAL_TABLET | Freq: Every evening | ORAL | Status: DC | PRN

## 2020-05-31 MED ORDER — CHLORHEXIDINE GLUCONATE CLOTH 2 % EX PADS: 6.0000 | MEDICATED_PAD | Freq: Every day | CUTANEOUS | Status: DC

## 2020-05-31 MED ORDER — DIPHENHYDRAMINE HCL 50 MG/ML IJ SOLN: 12.5000 mg | Freq: Four times a day (QID) | INTRAMUSCULAR | Status: DC | PRN

## 2020-05-31 MED ORDER — FENTANYL CITRATE (PF) 100 MCG/2ML IJ SOLN
25.0000 ug | INTRAMUSCULAR | Status: DC | PRN
Start: 2020-05-31 — End: 2020-05-31

## 2020-05-31 MED ORDER — GABAPENTIN 300 MG PO CAPS
600.0000 mg | ORAL_CAPSULE | Freq: Every day | ORAL | Status: DC
  Administered 2020-05-31: 600 mg via ORAL
  Filled 2020-05-31: qty 2

## 2020-05-31 MED ORDER — PHENYLEPHRINE HCL (PRESSORS) 10 MG/ML IV SOLN
INTRAVENOUS | Status: DC | PRN
  Administered 2020-05-31 (×6): 100 ug via INTRAVENOUS

## 2020-05-31 MED ORDER — FUROSEMIDE 20 MG PO TABS: 20.0000 mg | ORAL_TABLET | Freq: Two times a day (BID) | ORAL | Status: DC

## 2020-05-31 MED ORDER — ACETAMINOPHEN 325 MG PO TABS: 650.0000 mg | ORAL_TABLET | ORAL | Status: DC | PRN

## 2020-05-31 SURGICAL SUPPLY — 23 items
BAG DRAIN URO TABLE W/ADPT NS (BAG) ×2 IMPLANT
BAG DRN 8 ADPR NS SKTRN CSTL (BAG) ×1
BAG DRN URN TUBE DRIP CHMBR (OSTOMY) ×1
BAG URINE DRAIN TURP 4L (OSTOMY) ×2 IMPLANT
CATH FOLEY 3WAY 30CC 22F (CATHETERS) ×2 IMPLANT
CLOTH BEACON ORANGE TIMEOUT ST (SAFETY) ×2 IMPLANT
ELECT REM PT RETURN 9FT ADLT (ELECTROSURGICAL) ×2
ELECTRODE REM PT RTRN 9FT ADLT (ELECTROSURGICAL) ×1 IMPLANT
GLOVE BIO SURGEON STRL SZ8 (GLOVE) ×2 IMPLANT
GLOVE SURG UNDER POLY LF SZ7 (GLOVE) ×4 IMPLANT
GOWN STRL REUS W/TWL LRG LVL3 (GOWN DISPOSABLE) ×2 IMPLANT
GOWN STRL REUS W/TWL XL LVL3 (GOWN DISPOSABLE) ×2 IMPLANT
IV NS IRRIG 3000ML ARTHROMATIC (IV SOLUTION) ×18 IMPLANT
KIT TURNOVER CYSTO (KITS) ×2 IMPLANT
LOOP CUT BIPOLAR 24F LRG (ELECTROSURGICAL) ×2 IMPLANT
MANIFOLD NEPTUNE II (INSTRUMENTS) ×2 IMPLANT
PACK CYSTO (CUSTOM PROCEDURE TRAY) ×2 IMPLANT
PAD ARMBOARD 7.5X6 YLW CONV (MISCELLANEOUS) ×2 IMPLANT
SYR 30ML LL (SYRINGE) ×2 IMPLANT
SYR TOOMEY IRRIG 70ML (MISCELLANEOUS) ×2
SYRINGE TOOMEY IRRIG 70ML (MISCELLANEOUS) ×1 IMPLANT
TOWEL OR 17X26 4PK STRL BLUE (TOWEL DISPOSABLE) ×2 IMPLANT
WATER STERILE IRR 500ML POUR (IV SOLUTION) ×2 IMPLANT

## 2020-05-31 NOTE — H&P (Signed)
BPH  HPI: [name] is a 75yo here for followup for BPH with urinary retention. He has failed multiple voiding trials. He underwent UDS which showed a good bladder capcity. 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 He is very unhappy with the foley   PMH:     Past Medical History:  Diagnosis Date  . Adenocarcinoma of lung, right ([place]) 04/18/2016  . Anxiety   . Arthritis   . Asthma   . BPH (benign prostatic hyperplasia)    with urinary retention 02/06/20  . CHF (congestive heart failure) ([place])   . COPD (chronic obstructive pulmonary disease) ([place])   . Depression   . Diabetes mellitus without complication (HCC)    no meds  . DM (diabetes mellitus) ([place]) 07/09/2016  . Dyspnea   . History of kidney stones   . Hyperlipidemia   . Hypertension   . Hypothyroidism   . Macular degeneration   . Neuropathy   . Non-small cell lung cancer, right ([place]) 04/18/2016  . Peripheral vascular disease ([place])   . Prostatitis   . Pulmonary nodule, left 07/16/2016  . Sleep apnea    cpap    Surgical History:      Past Surgical History:  Procedure Laterality Date  . ABDOMINAL AORTOGRAM W/LOWER EXTREMITY Left 02/06/2020   Procedure: ABDOMINAL AORTOGRAM W/LOWER EXTREMITY;  Surgeon: Lorretta Harp, MD;  Location: [facility];  Service: Cardiovascular;  Laterality: Left;  . CATARACT EXTRACTION, BILATERAL Bilateral   . NO PAST SURGERIES    . PORTACATH PLACEMENT Left 06/13/2016   Procedure: INSERTION PORT-A-CATH;  Surgeon: Aviva Signs, MD;  Location: [facility];  Service: General;  Laterality: Left;  Marland Kitchen VIDEO BRONCHOSCOPY WITH ENDOBRONCHIAL NAVIGATION N/A 05/28/2016   Procedure: VIDEO BRONCHOSCOPY WITH ENDOBRONCHIAL NAVIGATION;  Surgeon: [name], MD;  Location: [facility];  Service: Thoracic;  Laterality: N/A;  . VIDEO BRONCHOSCOPY WITH ENDOBRONCHIAL ULTRASOUND N/A 05/28/2016   Procedure: VIDEO BRONCHOSCOPY WITH ENDOBRONCHIAL ULTRASOUND;  Surgeon: [name], MD;   Location: [facility];  Service: Thoracic;  Laterality: N/A;    Home Medications:       Allergies as of 04/19/2020      Reactions   Jardiance [empagliflozin]    FEELS SLUGGISH, TIRED   Lopressor [metoprolol]    Fatigue         Medication List       Accurate as of April 19, 2020 10:53 AM. If you have any questions, ask your nurse or doctor.        albuterol 108 (90 Base) MCG/ACT inhaler Commonly known as: VENTOLIN HFA Inhale 2 puffs into the lungs every 4 (four) hours as needed for wheezing or shortness of breath.   amLODipine 5 MG tablet Commonly known as: NORVASC Take 1 tablet (5 mg total) by mouth daily.   aspirin EC 81 MG tablet Take 81 mg by mouth daily.   cloNIDine 0.1 MG tablet Commonly known as: CATAPRES TAKE 1 TABLET (0.1 MG TOTAL) BY MOUTH 3 (THREE) TIMES DAILY.   doxycycline 100 MG tablet Commonly known as: VIBRA-TABS Take 1 tablet (100 mg total) by mouth 2 (two) times daily. 1 po bid   dutasteride 0.5 MG capsule Commonly known as: AVODART TAKE 1 CAPSULE BY MOUTH EVERY DAY What changed: how much to take   furosemide 20 MG tablet Commonly known as: LASIX Take 1 tablet (20 mg total) by mouth 2 (two) times daily. What changed: when to take this   gabapentin 300 MG capsule Commonly known as: NEURONTIN  Take 1 capsule (300 mg total) by mouth 3 (three) times daily. What changed:   how much to take  when to take this  additional instructions   levothyroxine 75 MCG tablet Commonly known as: SYNTHROID Take 1 tablet (75 mcg total) by mouth daily.   linaclotide 72 MCG capsule Commonly known as: Linzess Take 1 capsule (72 mcg total) by mouth daily before breakfast. What changed:   when to take this  reasons to take this   meloxicam 7.5 MG tablet Commonly known as: MOBIC Take 1 tablet (7.5 mg total) by mouth daily as needed for pain (back pain).   metFORMIN 500 MG 24 hr tablet Commonly known as: GLUCOPHAGE-XR Take 1  tablet (500 mg total) by mouth daily with breakfast.   metoprolol succinate 25 MG 24 hr tablet Commonly known as: Toprol XL Take 1 tablet (25 mg total) by mouth daily.   mupirocin ointment 2 % Commonly known as: BACTROBAN Apply 1 application topically 3 (three) times daily as needed (wound care.).   olmesartan 40 MG tablet Commonly known as: Benicar Take 1 tablet (40 mg total) by mouth daily.   ondansetron 4 MG disintegrating tablet Commonly known as: Zofran ODT Take 1 tablet (4 mg total) by mouth every 8 (eight) hours as needed for nausea or vomiting.   OneTouch Delica Lancets 42A Misc Use to test blood sugars daily as directed. DX: E11.9   OneTouch Verio Reflect w/Device Kit Use to test blood sugars daily as directed. DX: E11.9   OneTouch Verio test strip Generic drug: glucose blood Use to test blood sugars daily as directed. DX: E11.9   pravastatin 40 MG tablet Commonly known as: PRAVACHOL Take 1 tablet (40 mg total) by mouth every Monday, Wednesday, and Friday. At night   Rybelsus 7 MG Tabs Generic drug: Semaglutide Take 7 mg by mouth daily.   tamsulosin 0.4 MG Caps capsule Commonly known as: FLOMAX Take 2 capsules (0.8 mg total) by mouth daily after supper.   Trelegy Ellipta 100-62.5-25 MCG/INH Aepb Generic drug: Fluticasone-Umeclidin-Vilant Inhale 1 puff into the lungs daily.       Allergies:       Allergies  Allergen Reactions  . Jardiance [Empagliflozin]     FEELS SLUGGISH, TIRED  . Lopressor [Metoprolol]     Fatigue    Family History:      Family History  Problem Relation Age of Onset  . Hypertension Mother   . Diabetes Father   . Heart disease Father   . Stroke Father   . Hypertension Sister     Social History:  reports that he has been smoking cigarettes. He started smoking about 59 years ago. He has a 27.50 pack-year smoking history. He has never used smokeless tobacco. He reports that he does not drink  alcohol and does not use drugs.  ROS: All other review of systems were reviewed and are negative except what is noted above in HPI  Physical Exam: BP (!) 148/75   Pulse (!) 115   Temp (!) 97.4 F (36.3 C)   Ht 5' 6" (1.676 m)   Wt 171 lb (77.6 kg)   BMI 27.60 kg/m   Constitutional:  Alert and oriented, No acute distress. HEENT: [place] AT, moist mucus membranes.  Trachea midline, no masses. Cardiovascular: No clubbing, cyanosis, or edema. Respiratory: Normal respiratory effort, no increased work of breathing. GI: Abdomen is soft, nontender, nondistended, no abdominal masses GU: No CVA tenderness.  Lymph: No cervical or inguinal lymphadenopathy. Skin: No  rashes, bruises or suspicious lesions. Neurologic: Grossly intact, no focal deficits, moving all 4 extremities. Psychiatric: Normal mood and affect.  Laboratory Data: Recent Labs  Lab Results  Component Value Date   WBC 6.8 03/23/2020   HGB 13.7 03/23/2020   HCT 43.5 03/23/2020   MCV 95.6 03/23/2020   PLT 288 03/23/2020      Recent Labs  Lab Results  Component Value Date   CREATININE 0.85 03/23/2020      Recent Labs  No results found for: PSA    Recent Labs  No results found for: TESTOSTERONE    Recent Labs       Lab Results  Component Value Date   HGBA1C 6.8 03/20/2020      Urinalysis Labs (Brief)          Component Value Date/Time   COLORURINE YELLOW 03/03/2020 0510   APPEARANCEUR Cloudy (A) 04/05/2020 1000   LABSPEC 1.021 03/03/2020 0510   PHURINE 5.0 03/03/2020 0510   GLUCOSEU Negative 04/05/2020 1000   HGBUR LARGE (A) 03/03/2020 0510   BILIRUBINUR Negative 04/05/2020 1000   KETONESUR NEGATIVE 03/03/2020 0510   PROTEINUR 1+ (A) 04/05/2020 1000   PROTEINUR 100 (A) 03/03/2020 0510   NITRITE Positive (A) 04/05/2020 1000   NITRITE NEGATIVE 03/03/2020 0510   LEUKOCYTESUR 2+ (A) 04/05/2020 1000   LEUKOCYTESUR LARGE (A) 03/03/2020 0510      Recent Labs        Lab Results  Component Value Date   LABMICR See below: 04/05/2020   WBCUA 11-30 (A) 04/05/2020   LABEPIT None seen 04/05/2020   BACTERIA Many (A) 04/05/2020      Pertinent Imaging:  Results for orders placed in visit on 12/30/19  DG Abd 1 View  Narrative CLINICAL DATA:  Abdominal pain  EXAM: ABDOMEN - 1 VIEW  COMPARISON:  06/05/2017  FINDINGS: Scattered large and small bowel gas is noted. Mild retained fecal material is seen without obstructive change. Degenerative change of the lumbar spine is noted. No abnormal calcifications are seen.  IMPRESSION: Mild retained fecal material.   Electronically Signed By: Inez Catalina M.D. On: 01/01/2020 15:15  Results for orders placed during the hospital encounter of 08/28/16  US Venous Img Lower Bilateral  Narrative CLINICAL DATA:  Bilateral leg swelling.  Treatment for lung cancer.  EXAM: BILATERAL LOWER EXTREMITY VENOUS DOPPLER ULTRASOUND  TECHNIQUE: Gray-scale sonography with graded compression, as well as color Doppler and duplex ultrasound were performed to evaluate the lower extremity deep venous systems from the level of the common femoral vein and including the common femoral, femoral, profunda femoral, popliteal and calf veins including the posterior tibial, peroneal and gastrocnemius veins when visible. The superficial great saphenous vein was also interrogated. Spectral Doppler was utilized to evaluate flow at rest and with distal augmentation maneuvers in the common femoral, femoral and popliteal veins.  COMPARISON:  None.  FINDINGS: RIGHT LOWER EXTREMITY  Common Femoral Vein: No evidence of thrombus. Normal compressibility, respiratory phasicity and response to augmentation.  Saphenofemoral Junction: No evidence of thrombus. Normal compressibility and flow on color Doppler imaging.  Profunda Femoral Vein: No evidence of thrombus. Normal compressibility and flow on color  Doppler imaging.  Femoral Vein: No evidence of thrombus. Normal compressibility, respiratory phasicity and response to augmentation.  Popliteal Vein: No evidence of thrombus. Normal compressibility, respiratory phasicity and response to augmentation.  Calf Veins: No evidence of thrombus. Normal compressibility and flow on color Doppler imaging.  Superficial Great Saphenous Vein: No evidence of thrombus. Normal  compressibility and flow on color Doppler imaging.  Venous Reflux:  None.  Other Findings:  None.  LEFT LOWER EXTREMITY  Common Femoral Vein: No evidence of thrombus. Normal compressibility, respiratory phasicity and response to augmentation.  Saphenofemoral Junction: No evidence of thrombus. Normal compressibility and flow on color Doppler imaging.  Profunda Femoral Vein: No evidence of thrombus. Normal compressibility and flow on color Doppler imaging.  Femoral Vein: No evidence of thrombus. Normal compressibility, respiratory phasicity and response to augmentation.  Popliteal Vein: No evidence of thrombus. Normal compressibility, respiratory phasicity and response to augmentation.  Calf Veins: No evidence of thrombus. Normal compressibility and flow on color Doppler imaging.  Superficial Great Saphenous Vein: No evidence of thrombus. Normal compressibility and flow on color Doppler imaging.  Venous Reflux:  None.  Other Findings:  None.  IMPRESSION: No evidence of DVT within either lower extremity.   Electronically Signed By: Franchot Gallo M.D. On: 08/28/2016 14:17  No results found for this or any previous visit.  No results found for this or any previous visit.  No results found for this or any previous visit.  No results found for this or any previous visit.  No results found for this or any previous visit.  No results found for this or any previous visit.   Assessment & Plan:    1. BPH with urinary  obstruction -We discussed the management of his BPH including continued medical therapy, Rezum, Urolift, TURP and simple prostatectomy. After discussing the options the patient has elected to proceed with TURP. Risks/benefits/alternatives discussed.

## 2020-05-31 NOTE — Progress Notes (Signed)
IS left in room, patient was asleep. Inhalers in bag with IS for in the morning. Patient resting comfortably at this time.

## 2020-05-31 NOTE — Transfer of Care (Signed)
Immediate Anesthesia Transfer of Care Note  Patient: John [name]  Procedure(s) Performed: TRANSURETHRAL RESECTION OF THE PROSTATE (TURP) (N/A Prostate)  Patient Location: PACU  Anesthesia Type:General  Level of Consciousness: awake, drowsy and patient cooperative  Airway & Oxygen Therapy: Patient Spontanous Breathing and Patient connected to face mask oxygen  Post-op Assessment: Report given to RN and Post -op Vital signs reviewed and stable  Post vital signs: Reviewed and stable  Last Vitals:  Vitals Value Taken Time  BP 117/57 05/31/20 1404  Temp    Pulse 104 05/31/20 1408  Resp 11 05/31/20 1408  SpO2 100 % 05/31/20 1408  Vitals shown include unvalidated device data.  Last Pain:  Vitals:   05/31/20 1119  TempSrc: Oral      Patients Stated Pain Goal: 6 (33/58/25 1898)  Complications: No complications documented.

## 2020-05-31 NOTE — Op Note (Signed)
Preoperative diagnosis: BPH with urinary retnetion  Postoperative diagnosis: BPH  Procedure: 1 cystoscopy 2. Transurethral resection of the prostate  Attending: Nicolette Bang  Anesthesia: General  Estimated blood loss: Minimal  Drains: 22 French foley  Specimens: 1. Prostate Chips  Antibiotics: Rocephin  Findings: Trilobar prostate enlargement. Ureteral orifices in normal anatomic location.   Indications: Patient is a 75 year old male with a history of BPH and urinary retention.  After discussing treatment options, they decided proceed with transurethral resection of the prostate.  Procedure in detail: The patient was brought to the operating room and a brief timeout was done to ensure correct patient, correct procedure, correct site.  General anesthesia was administered patient was placed in dorsal lithotomy position.  Their genitalia was then prepped and draped in usual sterile fashion.  A rigid 54 French cystoscope was passed in the urethra and the bladder.  Bladder was inspected and we noted no masses or lesions.  the ureteral orifices were in the normal orthotopic locations. removed the cystoscope and placed a resectoscope into the bladder. We then turned our attention to the prostate resection. Using the bipolar resectoscope we resected the median lobe first from the bladder neck to the verumontanum. We then started at the 12 oclock position on the left lobe and resection to the 6 o'clock position from the bladder neck to the verumontanum. We then did the same resection of the right lobe. Once the resection was complete we then cauterized individual bleeders. We then removed the prostate chips and sent them for pathology.  We then re-inspected the prostatic fossa and found no residual bleeding.  the bladder was then drained, a 22 French foley was placed and this concluded the procedure which was well tolerated by patient.  Complications: None  Condition: Stable, extubated,  transferred to PACU  Plan: Patient is admitted overnight with continuous bladder irrigation. If their urine is clear tomorrow they will be discharged home and followup in 5 days for foley catheter removal and pathology discussion.

## 2020-05-31 NOTE — Anesthesia Postprocedure Evaluation (Signed)
Anesthesia Post Note  Patient: Vue Pavon  Procedure(s) Performed: TRANSURETHRAL RESECTION OF THE PROSTATE (TURP) (N/A Prostate)  Patient location during evaluation: PACU Anesthesia Type: General Level of consciousness: awake, patient cooperative and sedated Pain management: pain level controlled Vital Signs Assessment: post-procedure vital signs reviewed and stable Respiratory status: spontaneous breathing, respiratory function stable, nonlabored ventilation and patient connected to face mask oxygen Cardiovascular status: stable Postop Assessment: no apparent nausea or vomiting Anesthetic complications: no   No complications documented.   Last Vitals:  Vitals:   05/31/20 1116 05/31/20 1119  BP:  133/71  Pulse:  96  Resp:  15  Temp: 36.8 C   SpO2:  95%    Last Pain:  Vitals:   05/31/20 1119  TempSrc: Oral                 Ronak Duquette

## 2020-05-31 NOTE — OR Nursing (Signed)
Leg bag emptied 100 cc dark yellow urine.

## 2020-05-31 NOTE — Anesthesia Preprocedure Evaluation (Signed)
Anesthesia Evaluation  Patient identified by MRN, date of birth, ID band Patient awake    Reviewed: Allergy & Precautions, H&P , NPO status , Patient's Chart, lab work & pertinent test results, reviewed documented beta blocker date and time   Airway Mallampati: II  TM Distance: >3 FB Neck ROM: full    Dental no notable dental hx.    Pulmonary shortness of breath, asthma , sleep apnea and Continuous Positive Airway Pressure Ventilation , COPD, Current Smoker,    Pulmonary exam normal breath sounds clear to auscultation       Cardiovascular Exercise Tolerance: Good hypertension, + Peripheral Vascular Disease   Rhythm:regular Rate:Normal     Neuro/Psych PSYCHIATRIC DISORDERS Anxiety Depression negative neurological ROS     GI/Hepatic negative GI ROS, Neg liver ROS,   Endo/Other  negative endocrine ROSdiabetes  Renal/GU negative Renal ROS  negative genitourinary   Musculoskeletal   Abdominal   Peds  Hematology negative hematology ROS (+)   Anesthesia Other Findings   Reproductive/Obstetrics negative OB ROS                             Anesthesia Physical Anesthesia Plan  ASA: III  Anesthesia Plan: General and General LMA   Post-op Pain Management:    Induction:   PONV Risk Score and Plan: Ondansetron  Airway Management Planned:   Additional Equipment:   Intra-op Plan:   Post-operative Plan:   Informed Consent: I have reviewed the patients History and Physical, chart, labs and discussed the procedure including the risks, benefits and alternatives for the proposed anesthesia with the patient or authorized representative who has indicated his/her understanding and acceptance.     Dental Advisory Given  Plan Discussed with: CRNA  Anesthesia Plan Comments:         Anesthesia Quick Evaluation

## 2020-05-31 NOTE — Anesthesia Procedure Notes (Signed)
Procedure Name: LMA Insertion Date/Time: 05/31/2020 12:38 PM Performed by: Jonna Munro, CRNA Pre-anesthesia Checklist: Patient identified, Emergency Drugs available, Suction available, Patient being monitored and Timeout performed Patient Re-evaluated:Patient Re-evaluated prior to induction Oxygen Delivery Method: Circle system utilized Preoxygenation: Pre-oxygenation with 100% oxygen Induction Type: IV induction LMA: LMA inserted LMA Size: 5.0 Number of attempts: 1 Placement Confirmation: positive ETCO2 and breath sounds checked- equal and bilateral Tube secured with: Tape Dental Injury: Teeth and Oropharynx as per pre-operative assessment

## 2020-06-01 ENCOUNTER — Other Ambulatory Visit: Payer: Self-pay | Admitting: Urology

## 2020-06-01 ENCOUNTER — Ambulatory Visit (INDEPENDENT_AMBULATORY_CARE_PROVIDER_SITE_OTHER): Payer: Medicare Other

## 2020-06-01 ENCOUNTER — Encounter (HOSPITAL_COMMUNITY): Payer: Self-pay | Admitting: Urology

## 2020-06-01 DIAGNOSIS — N401 Enlarged prostate with lower urinary tract symptoms: Secondary | ICD-10-CM | POA: Diagnosis not present

## 2020-06-01 DIAGNOSIS — N138 Other obstructive and reflux uropathy: Secondary | ICD-10-CM | POA: Diagnosis not present

## 2020-06-01 LAB — GLUCOSE, CAPILLARY: Glucose-Capillary: 108 mg/dL — ABNORMAL HIGH (ref 70–99)

## 2020-06-01 LAB — CBC
HCT: 34.7 % — ABNORMAL LOW (ref 39.0–52.0)
Hemoglobin: 11 g/dL — ABNORMAL LOW (ref 13.0–17.0)
MCH: 31.3 pg (ref 26.0–34.0)
MCHC: 31.7 g/dL (ref 30.0–36.0)
MCV: 98.9 fL (ref 80.0–100.0)
Platelets: 365 10*3/uL (ref 150–400)
RBC: 3.51 MIL/uL — ABNORMAL LOW (ref 4.22–5.81)
RDW: 15.7 % — ABNORMAL HIGH (ref 11.5–15.5)
WBC: 6.5 10*3/uL (ref 4.0–10.5)
nRBC: 0 % (ref 0.0–0.2)

## 2020-06-01 LAB — BASIC METABOLIC PANEL
Anion gap: 8 (ref 5–15)
BUN: 12 mg/dL (ref 8–23)
CO2: 27 mmol/L (ref 22–32)
Calcium: 8.8 mg/dL — ABNORMAL LOW (ref 8.9–10.3)
Chloride: 103 mmol/L (ref 98–111)
Creatinine, Ser: 0.74 mg/dL (ref 0.61–1.24)
GFR, Estimated: >60 mL/min (ref >60)
Glucose, Bld: 110 mg/dL — ABNORMAL HIGH (ref 70–99)
Potassium: 3.9 mmol/L (ref 3.5–5.1)
Sodium: 138 mmol/L (ref 135–145)

## 2020-06-01 MED ORDER — BACITRACIN 500 UNIT/GM EX OINT: 1.0000 "application " | TOPICAL_OINTMENT | Freq: Two times a day (BID) | CUTANEOUS | 0 refills | Status: DC

## 2020-06-01 MED ORDER — HYDROCODONE-ACETAMINOPHEN 5-325 MG PO TABS: 1.0000 | ORAL_TABLET | Freq: Four times a day (QID) | ORAL | 0 refills | Status: DC | PRN

## 2020-06-01 NOTE — Discharge Instructions (Signed)
Transurethral Resection of the Prostate, Care After This sheet gives you information about how to care for yourself after your procedure. Your health care provider may also give you more specific instructions. If you have problems or questions, contact your health care provider. What can I expect after the procedure? After the procedure, it is common to have:  Mild pain in your lower abdomen.  Soreness or mild discomfort in your penis from having the catheter inserted during the procedure.  A feeling of urgency when you need to urinate.  A small amount of blood in your urine. You may notice some small blood clots in your urine. These are normal. Follow these instructions at home: Medicines  Take over-the-counter and prescription medicines only as told by your health care provider.  If you were prescribed an antibiotic medicine, take it as told by your health care provider. Do not stop taking the antibiotic even if you start to feel better.  Ask your health care provider if the medicine prescribed to you: ? Requires you to avoid driving or using heavy machinery. ? Can cause constipation. You may need to take actions to prevent or treat constipation, such as:  Take over-the-counter or prescription medicines.  Eat foods that are high in fiber, such as fresh fruits and vegetables, whole grains, and beans.  Limit foods that are high in fat and processed sugars, such as fried or sweet foods.  Do not drive for 24 hours if you were given a sedative during your procedure. Activity  Return to your normal activities as told by your health care provider. Ask your health care provider what activities are safe for you.  Do not lift anything that is heavier than 10 lb (4.5 kg), or the limit that you are told, for 3 weeks after the procedure or until your health care provider says that it is safe.  Avoid intense physical activity for as long as told by your health care provider.  Avoid sitting  for a long time without moving. Get up and move around one or more times every few hours. This helps to prevent blood clots. You may increase your physical activity gradually as you start to feel better.   Lifestyle  Do not drink alcohol for as long as told by your health care provider. This is especially important if you are taking prescription pain medicines.  Do not engage in sexual activity until your health care provider says that you can do this. General instructions  Do not take baths, swim, or use a hot tub until your health care provider approves.  Drink enough fluid to keep your urine pale yellow.  Urinate as soon as you feel the need to. Do not try to hold your urine for long periods of time.  If your health care provider approves, you may take a stool softener for 2-3 weeks to prevent you from straining to have a bowel movement.  Wear compression stockings as told by your health care provider. These stockings help to prevent blood clots and reduce swelling in your legs.  Keep all follow-up visits as told by your health care provider. This is important.   Contact a health care provider if you have:  Difficulty urinating.  A fever.  Pain that gets worse or does not improve with medicine.  Blood in your urine that does not go away after 1 week of resting and drinking more fluids.  Swelling in your penis or testicles. Get help right away if:  You  are unable to urinate.  You are having more blood clots in your urine instead of fewer.  You have: ? Large blood clots. ? A lot of blood in your urine. ? Pain in your back or lower abdomen. ? Pain or swelling in your legs. ? Chills and you are shaking. ? Difficulty breathing or shortness of breath. Summary  After the procedure, it is common to have a small amount of blood in your urine.  Avoid heavy lifting and intense physical activity for as long as told by your health care provider.  Urinate as soon as you feel the  need to. Do not try to hold your urine for long periods of time.  Keep all follow-up visits as told by your health care provider. This is important. This information is not intended to replace advice given to you by your health care provider. Make sure you discuss any questions you have with your health care provider. Document Revised: 07/14/2018 Document Reviewed: 12/23/2017 Elsevier Patient Education  2021 [employer].

## 2020-06-01 NOTE — Discharge Summary (Signed)
Physician Discharge Summary  Patient ID: John [name] MRN: [id number] DOB/AGE: [birth year] 75 y.o.  Admit date: 05/31/2020 Discharge date: 06/01/2020  Admission Diagnoses:  BPH with urinary retention  Discharge Diagnoses:  Active Problems:   BPH with urinary obstruction   Past Medical History:  Diagnosis Date  . Adenocarcinoma of lung, right ([place]) 04/18/2016  . Anxiety   . Arthritis   . Asthma   . BPH (benign prostatic hyperplasia)    with urinary retention 02/06/20  . CHF (congestive heart failure) ([place])   . COPD (chronic obstructive pulmonary disease) ([place])   . Depression   . Diabetes mellitus without complication ([facility])    no meds  . Diabetes mellitus, type II ([place])   . DM (diabetes mellitus) ([place]) 07/09/2016  . Dyspnea   . History of kidney stones   . Hyperlipidemia   . Hypertension   . Hypertension   . Hypothyroidism   . Macular degeneration   . Neuropathy   . Non-small cell lung cancer, right ([place]) 04/18/2016  . Peripheral vascular disease ([place])   . Prostatitis   . Pulmonary nodule, left 07/16/2016  . Sleep apnea    cpap    Surgeries: Procedure(s): TRANSURETHRAL RESECTION OF THE PROSTATE (TURP) on 05/31/2020   Consultants (if any):   Discharged Condition: Improved  Hospital Course: Cordarrel Stiefel is an 75 y.o. male who was admitted 05/31/2020 with a diagnosis of <principal problem not specified> and went to the operating room on 05/31/2020 and underwent the above named procedures.    He was given perioperative antibiotics:  Anti-infectives (From admission, onward)   Start     Dose/Rate Route Frequency Ordered Stop   05/31/20 1200  cefTRIAXone (ROCEPHIN) 2 g in sodium chloride 0.9 % 100 mL IVPB        2 g 200 mL/hr over 30 Minutes Intravenous 30 min pre-op 05/31/20 1044 05/31/20 1259   05/31/20 1043  sodium chloride 0.9 % with cefTRIAXone (ROCEPHIN) ADS Med       Note to Pharmacy: Charm Barges   : cabinet override      05/31/20 1043 05/31/20 1252     .  He was given sequential compression devices, early ambulation,  for DVT prophylaxis.  He benefited maximally from the hospital stay and there were no complications.    Recent vital signs:  Vitals:   06/01/20 0427 06/01/20 0800  BP: (!) 103/56 124/62  Pulse: 99 (!) 106  Resp: 16 20  Temp: 97.6 F (36.4 C) 98.2 F (36.8 C)  SpO2: 97% 91%    Recent laboratory studies:  Lab Results  Component Value Date   HGB 11.0 (L) 06/01/2020   HGB 11.4 (L) 05/31/2020   HGB 13.1 05/24/2020   Lab Results  Component Value Date   WBC 6.5 06/01/2020   PLT 365 06/01/2020   Lab Results  Component Value Date   INR 1.1 05/06/2020   Lab Results  Component Value Date   NA 138 06/01/2020   K 3.9 06/01/2020   CL 103 06/01/2020   CO2 27 06/01/2020   BUN 12 06/01/2020   CREATININE 0.74 06/01/2020   GLUCOSE 110 (H) 06/01/2020    Discharge Medications:   Allergies as of 06/01/2020      Reactions   Jardiance [empagliflozin] Other (See Comments)   FEELS SLUGGISH, TIRED   Lopressor [metoprolol] Other (See Comments)   Fatigue      Medication List    STOP taking these medications   tamsulosin 0.4 MG Caps capsule  Commonly known as: FLOMAX     TAKE these medications   acetaminophen 650 MG CR tablet Commonly known as: TYLENOL Take 650 mg by mouth every 8 (eight) hours as needed for pain.   acetaminophen 500 MG tablet Commonly known as: TYLENOL Take 1,000 mg by mouth at bedtime as needed for moderate pain.   albuterol 108 (90 Base) MCG/ACT inhaler Commonly known as: VENTOLIN HFA Inhale 2 puffs into the lungs every 4 (four) hours as needed for wheezing or shortness of breath.   aspirin EC 81 MG tablet Take 81 mg by mouth daily.   cloNIDine 0.1 MG tablet Commonly known as: CATAPRES TAKE 1 TABLET (0.1 MG TOTAL) BY MOUTH 3 (THREE) TIMES DAILY.   dutasteride 0.5 MG capsule Commonly known as: AVODART TAKE 1 CAPSULE BY MOUTH EVERY DAY What changed: how much to take    fluticasone 50 MCG/ACT nasal spray Commonly known as: FLONASE Place 1 spray into both nostrils daily as needed for allergies or rhinitis.   furosemide 20 MG tablet Commonly known as: LASIX Take 1 tablet (20 mg total) by mouth 2 (two) times daily.   gabapentin 300 MG capsule Commonly known as: NEURONTIN Take 1 capsule (300 mg total) by mouth 3 (three) times daily. What changed:   how much to take  when to take this  additional instructions   HYDROcodone-acetaminophen 5-325 MG tablet Commonly known as: Norco Take 1 tablet by mouth every 6 (six) hours as needed for moderate pain.   levothyroxine 75 MCG tablet Commonly known as: SYNTHROID Take 1 tablet (75 mcg total) by mouth daily.   linaclotide 72 MCG capsule Commonly known as: Linzess Take 1 capsule (72 mcg total) by mouth daily before breakfast. What changed:   when to take this  reasons to take this   losartan 50 MG tablet Commonly known as: COZAAR Take 1 tablet (50 mg total) by mouth daily.   meloxicam 7.5 MG tablet Commonly known as: MOBIC Take 1 tablet (7.5 mg total) by mouth daily as needed for pain (back pain).   metFORMIN 500 MG 24 hr tablet Commonly known as: GLUCOPHAGE-XR Take 1 tablet (500 mg total) by mouth daily with breakfast.   metoprolol succinate 25 MG 24 hr tablet Commonly known as: TOPROL-XL Take 1.5 tablets (37.5 mg total) by mouth daily. Take with or immediately following a meal.   nitrofurantoin 50 MG capsule Commonly known as: MACRODANTIN Take 1 capsule (50 mg total) by mouth at bedtime.   ondansetron 4 MG disintegrating tablet Commonly known as: Zofran ODT Take 1 tablet (4 mg total) by mouth every 8 (eight) hours as needed for nausea or vomiting.   OneTouch Delica Lancets 16X Misc Use to test blood sugars daily as directed. DX: E11.9   OneTouch Verio Reflect w/Device Kit Use to test blood sugars daily as directed. DX: E11.9   OneTouch Verio test strip Generic drug: glucose  blood Use to test blood sugars daily as directed. DX: E11.9   pravastatin 40 MG tablet Commonly known as: PRAVACHOL TAKE 1 TABLET (40 MG TOTAL) BY MOUTH ON MONDAYS , WEDNESDAYS, AND FRIDAYS AS DIRECTED What changed: See the new instructions.   Rybelsus 7 MG Tabs Generic drug: Semaglutide Take 7 mg by mouth daily.   Trelegy Ellipta 100-62.5-25 MCG/INH Aepb Generic drug: Fluticasone-Umeclidin-Vilant Inhale 1 puff into the lungs daily.   zolpidem 5 MG tablet Commonly known as: AMBIEN Take 5 mg by mouth at bedtime as needed for sleep.  Diagnostic Studies: DG Chest Port 1 View  Result Date: 05/05/2020 CLINICAL DATA:  Cough and fever. EXAM: PORTABLE CHEST 1 VIEW COMPARISON:  Radiograph 03/04/2020.  CT 03/23/2020 FINDINGS: Left chest port remains in place, unchanged in position. Chronic hyperinflation and interstitial coarsening. No acute airspace disease. Pulmonary nodule on prior CT are not well seen by radiograph. Right upper lobe pulmonary nodule is faintly visualized. Stable heart size and mediastinal contours. Aortic atherosclerosis. No pleural fluid or pneumothorax. No pulmonary edema. No acute osseous abnormalities are seen. IMPRESSION: 1. No acute abnormality. 2. Stable hyperinflation and interstitial coarsening. 3. Known pulmonary nodules are not well seen by radiograph, right upper lobe nodules faintly visualized Electronically Signed   By: Keith Rake M.D.   On: 05/05/2020 23:54    Disposition: Discharge disposition: 01-Home or Self Care       Discharge Instructions    Discharge patient   Complete by: As directed    Discharge disposition: 01-Home or Self Care   Discharge patient date: 06/01/2020       Follow-up Information    Cleon Gustin, MD Follow up in 1 week(s).   Specialty: Urology Contact information: [address]  [place] [address] [phone]                Signed: Nicolette Bang 06/01/2020, 8:19 AM

## 2020-06-01 NOTE — Plan of Care (Signed)
  Problem: Education: Goal: Knowledge of General Education information will improve Description Including pain rating scale, medication(s)/side effects and non-pharmacologic comfort measures Outcome: Progressing   Problem: Health Behavior/Discharge Planning: Goal: Ability to manage health-related needs will improve Outcome: Progressing   

## 2020-06-01 NOTE — Plan of Care (Signed)
  Problem: Education: Goal: Knowledge of General Education information will improve Description: Including pain rating scale, medication(s)/side effects and non-pharmacologic comfort measures 06/01/2020 1040 by Adam Phenix, RN Outcome: Adequate for Discharge 06/01/2020 0803 by Adam Phenix, RN Outcome: Progressing   Problem: Health Behavior/Discharge Planning: Goal: Ability to manage health-related needs will improve 06/01/2020 1040 by Adam Phenix, RN Outcome: Adequate for Discharge 06/01/2020 0803 by Adam Phenix, RN Outcome: Progressing

## 2020-06-01 NOTE — Progress Notes (Signed)
Patient came in today after have TURP on 05/31/2020.. pt c/o of seeing blood around penis and catheter. Dr. Alyson Ingles came in to see patient and discussed that is normal after the TURP.   Dr. Alyson Ingles sent in bacitracin for patient. Extra leg bag was given to patient. Pt will keep scheduled post op appt.

## 2020-06-02 ENCOUNTER — Encounter (HOSPITAL_COMMUNITY): Payer: Self-pay

## 2020-06-02 ENCOUNTER — Emergency Department (HOSPITAL_COMMUNITY): Payer: Medicare Other

## 2020-06-02 ENCOUNTER — Inpatient Hospital Stay (HOSPITAL_COMMUNITY)
Admission: EM | Admit: 2020-06-02 | Discharge: 2020-06-05 | DRG: 862 | Disposition: A | Discharge status: Home Health | Payer: Medicare Other | Attending: Internal Medicine | Admitting: Internal Medicine

## 2020-06-02 ENCOUNTER — Other Ambulatory Visit: Payer: Self-pay

## 2020-06-02 DIAGNOSIS — J9601 Acute respiratory failure with hypoxia: Secondary | ICD-10-CM | POA: Diagnosis not present

## 2020-06-02 DIAGNOSIS — E039 Hypothyroidism, unspecified: Secondary | ICD-10-CM | POA: Diagnosis present

## 2020-06-02 DIAGNOSIS — N390 Urinary tract infection, site not specified: Secondary | ICD-10-CM | POA: Diagnosis present

## 2020-06-02 DIAGNOSIS — J449 Chronic obstructive pulmonary disease, unspecified: Secondary | ICD-10-CM | POA: Diagnosis present

## 2020-06-02 DIAGNOSIS — F17210 Nicotine dependence, cigarettes, uncomplicated: Secondary | ICD-10-CM | POA: Diagnosis present

## 2020-06-02 DIAGNOSIS — R338 Other retention of urine: Secondary | ICD-10-CM | POA: Diagnosis present

## 2020-06-02 DIAGNOSIS — A419 Sepsis, unspecified organism: Secondary | ICD-10-CM | POA: Diagnosis not present

## 2020-06-02 DIAGNOSIS — R0902 Hypoxemia: Secondary | ICD-10-CM | POA: Diagnosis not present

## 2020-06-02 DIAGNOSIS — J439 Emphysema, unspecified: Secondary | ICD-10-CM | POA: Diagnosis not present

## 2020-06-02 DIAGNOSIS — G9341 Metabolic encephalopathy: Secondary | ICD-10-CM | POA: Diagnosis present

## 2020-06-02 DIAGNOSIS — T8144XA Sepsis following a procedure, initial encounter: Secondary | ICD-10-CM | POA: Diagnosis present

## 2020-06-02 DIAGNOSIS — Z79890 Hormone replacement therapy (postmenopausal): Secondary | ICD-10-CM

## 2020-06-02 DIAGNOSIS — R0602 Shortness of breath: Secondary | ICD-10-CM | POA: Diagnosis not present

## 2020-06-02 DIAGNOSIS — Z833 Family history of diabetes mellitus: Secondary | ICD-10-CM | POA: Diagnosis not present

## 2020-06-02 DIAGNOSIS — E1151 Type 2 diabetes mellitus with diabetic peripheral angiopathy without gangrene: Secondary | ICD-10-CM | POA: Diagnosis present

## 2020-06-02 DIAGNOSIS — R6520 Severe sepsis without septic shock: Secondary | ICD-10-CM | POA: Diagnosis present

## 2020-06-02 DIAGNOSIS — Z823 Family history of stroke: Secondary | ICD-10-CM

## 2020-06-02 DIAGNOSIS — A4152 Sepsis due to Pseudomonas: Secondary | ICD-10-CM | POA: Diagnosis present

## 2020-06-02 DIAGNOSIS — J45909 Unspecified asthma, uncomplicated: Secondary | ICD-10-CM | POA: Diagnosis not present

## 2020-06-02 DIAGNOSIS — N138 Other obstructive and reflux uropathy: Secondary | ICD-10-CM | POA: Diagnosis present

## 2020-06-02 DIAGNOSIS — I4510 Unspecified right bundle-branch block: Secondary | ICD-10-CM | POA: Diagnosis present

## 2020-06-02 DIAGNOSIS — N401 Enlarged prostate with lower urinary tract symptoms: Secondary | ICD-10-CM | POA: Diagnosis present

## 2020-06-02 DIAGNOSIS — G4733 Obstructive sleep apnea (adult) (pediatric): Secondary | ICD-10-CM | POA: Diagnosis present

## 2020-06-02 DIAGNOSIS — I110 Hypertensive heart disease with heart failure: Secondary | ICD-10-CM | POA: Diagnosis present

## 2020-06-02 DIAGNOSIS — Z79899 Other long term (current) drug therapy: Secondary | ICD-10-CM

## 2020-06-02 DIAGNOSIS — R4182 Altered mental status, unspecified: Secondary | ICD-10-CM | POA: Diagnosis not present

## 2020-06-02 DIAGNOSIS — I509 Heart failure, unspecified: Secondary | ICD-10-CM | POA: Diagnosis not present

## 2020-06-02 DIAGNOSIS — E785 Hyperlipidemia, unspecified: Secondary | ICD-10-CM | POA: Diagnosis present

## 2020-06-02 DIAGNOSIS — Z8249 Family history of ischemic heart disease and other diseases of the circulatory system: Secondary | ICD-10-CM

## 2020-06-02 DIAGNOSIS — Z85118 Personal history of other malignant neoplasm of bronchus and lung: Secondary | ICD-10-CM | POA: Diagnosis not present

## 2020-06-02 DIAGNOSIS — R0689 Other abnormalities of breathing: Secondary | ICD-10-CM | POA: Diagnosis not present

## 2020-06-02 DIAGNOSIS — Z20822 Contact with and (suspected) exposure to covid-19: Secondary | ICD-10-CM | POA: Diagnosis present

## 2020-06-02 DIAGNOSIS — I5033 Acute on chronic diastolic (congestive) heart failure: Secondary | ICD-10-CM | POA: Diagnosis present

## 2020-06-02 DIAGNOSIS — R911 Solitary pulmonary nodule: Secondary | ICD-10-CM | POA: Diagnosis not present

## 2020-06-02 DIAGNOSIS — Z7984 Long term (current) use of oral hypoglycemic drugs: Secondary | ICD-10-CM | POA: Diagnosis not present

## 2020-06-02 DIAGNOSIS — Z791 Long term (current) use of non-steroidal anti-inflammatories (NSAID): Secondary | ICD-10-CM

## 2020-06-02 DIAGNOSIS — Z7951 Long term (current) use of inhaled steroids: Secondary | ICD-10-CM

## 2020-06-02 DIAGNOSIS — Z7982 Long term (current) use of aspirin: Secondary | ICD-10-CM

## 2020-06-02 DIAGNOSIS — H3530 Unspecified macular degeneration: Secondary | ICD-10-CM | POA: Diagnosis present

## 2020-06-02 DIAGNOSIS — E876 Hypokalemia: Secondary | ICD-10-CM | POA: Diagnosis present

## 2020-06-02 DIAGNOSIS — Z888 Allergy status to other drugs, medicaments and biological substances status: Secondary | ICD-10-CM

## 2020-06-02 DIAGNOSIS — R000 Tachycardia, unspecified: Secondary | ICD-10-CM | POA: Diagnosis not present

## 2020-06-02 DIAGNOSIS — N179 Acute kidney failure, unspecified: Secondary | ICD-10-CM | POA: Diagnosis present

## 2020-06-02 LAB — TSH: TSH: 0.117 u[IU]/mL — ABNORMAL LOW (ref 0.350–4.500)

## 2020-06-02 LAB — CBC WITH DIFFERENTIAL/PLATELET
Abs Immature Granulocytes: 0.03 10*3/uL (ref 0.00–0.07)
Basophils Absolute: 0 10*3/uL (ref 0.0–0.1)
Basophils Relative: 0 %
Eosinophils Absolute: 0.1 10*3/uL (ref 0.0–0.5)
Eosinophils Relative: 1 %
HCT: 35.1 % — ABNORMAL LOW (ref 39.0–52.0)
Hemoglobin: 11 g/dL — ABNORMAL LOW (ref 13.0–17.0)
Immature Granulocytes: 0 %
Lymphocytes Relative: 3 %
Lymphs Abs: 0.2 10*3/uL — ABNORMAL LOW (ref 0.7–4.0)
MCH: 31.2 pg (ref 26.0–34.0)
MCHC: 31.3 g/dL (ref 30.0–36.0)
MCV: 99.4 fL (ref 80.0–100.0)
Monocytes Absolute: 0.5 10*3/uL (ref 0.1–1.0)
Monocytes Relative: 5 %
Neutro Abs: 7.5 10*3/uL (ref 1.7–7.7)
Neutrophils Relative %: 91 %
Platelets: 373 10*3/uL (ref 150–400)
RBC: 3.53 MIL/uL — ABNORMAL LOW (ref 4.22–5.81)
RDW: 15.9 % — ABNORMAL HIGH (ref 11.5–15.5)
WBC: 8.4 10*3/uL (ref 4.0–10.5)
nRBC: 0 % (ref 0.0–0.2)

## 2020-06-02 LAB — URINALYSIS, ROUTINE W REFLEX MICROSCOPIC
Bacteria, UA: NONE SEEN
Bilirubin Urine: NEGATIVE
Glucose, UA: NEGATIVE mg/dL
Ketones, ur: NEGATIVE mg/dL
Leukocytes,Ua: NEGATIVE
Nitrite: NEGATIVE
Protein, ur: 100 mg/dL — AB
RBC / HPF: >50 RBC/hpf — ABNORMAL HIGH (ref 0–5)
Specific Gravity, Urine: 1.016 (ref 1.005–1.030)
WBC, UA: >50 WBC/hpf — ABNORMAL HIGH (ref 0–5)
pH: 6 (ref 5.0–8.0)

## 2020-06-02 LAB — COMPREHENSIVE METABOLIC PANEL
ALT: 13 U/L (ref 0–44)
AST: 16 U/L (ref 15–41)
Albumin: 3.2 g/dL — ABNORMAL LOW (ref 3.5–5.0)
Alkaline Phosphatase: 79 U/L (ref 38–126)
Anion gap: 10 (ref 5–15)
BUN: 17 mg/dL (ref 8–23)
CO2: 27 mmol/L (ref 22–32)
Calcium: 8.8 mg/dL — ABNORMAL LOW (ref 8.9–10.3)
Chloride: 100 mmol/L (ref 98–111)
Creatinine, Ser: 1.03 mg/dL (ref 0.61–1.24)
GFR, Estimated: >60 mL/min (ref >60)
Glucose, Bld: 163 mg/dL — ABNORMAL HIGH (ref 70–99)
Potassium: 3.4 mmol/L — ABNORMAL LOW (ref 3.5–5.1)
Sodium: 137 mmol/L (ref 135–145)
Total Bilirubin: 0.4 mg/dL (ref 0.3–1.2)
Total Protein: 6.3 g/dL — ABNORMAL LOW (ref 6.5–8.1)

## 2020-06-02 LAB — CBC
HCT: 32.2 % — ABNORMAL LOW (ref 39.0–52.0)
Hemoglobin: 10 g/dL — ABNORMAL LOW (ref 13.0–17.0)
MCH: 31.3 pg (ref 26.0–34.0)
MCHC: 31.1 g/dL (ref 30.0–36.0)
MCV: 100.6 fL — ABNORMAL HIGH (ref 80.0–100.0)
Platelets: 324 10*3/uL (ref 150–400)
RBC: 3.2 MIL/uL — ABNORMAL LOW (ref 4.22–5.81)
RDW: 15.9 % — ABNORMAL HIGH (ref 11.5–15.5)
WBC: 10 10*3/uL (ref 4.0–10.5)
nRBC: 0 % (ref 0.0–0.2)

## 2020-06-02 LAB — LACTIC ACID, PLASMA
Lactic Acid, Venous: 1.8 mmol/L (ref 0.5–1.9)
Lactic Acid, Venous: 1.9 mmol/L (ref 0.5–1.9)

## 2020-06-02 LAB — PROCALCITONIN: Procalcitonin: 0.78 ng/mL

## 2020-06-02 LAB — RESP PANEL BY RT-PCR (FLU A&B, COVID) ARPGX2
Influenza A by PCR: NEGATIVE
Influenza B by PCR: NEGATIVE
SARS Coronavirus 2 by RT PCR: NEGATIVE

## 2020-06-02 LAB — CREATININE, SERUM
Creatinine, Ser: 0.96 mg/dL (ref 0.61–1.24)
GFR, Estimated: >60 mL/min (ref >60)

## 2020-06-02 LAB — MRSA PCR SCREENING: MRSA by PCR: NEGATIVE

## 2020-06-02 LAB — TROPONIN I (HIGH SENSITIVITY)
Troponin I (High Sensitivity): 15 ng/L (ref <18)
Troponin I (High Sensitivity): 17 ng/L (ref <18)

## 2020-06-02 LAB — GLUCOSE, CAPILLARY
Glucose-Capillary: 115 mg/dL — ABNORMAL HIGH (ref 70–99)
Glucose-Capillary: 134 mg/dL — ABNORMAL HIGH (ref 70–99)

## 2020-06-02 MED ORDER — SODIUM CHLORIDE 0.9 % IV SOLN
2.0000 g | Freq: Three times a day (TID) | INTRAVENOUS | Status: DC
  Administered 2020-06-02 – 2020-06-05 (×8): 2 g via INTRAVENOUS
  Filled 2020-06-02 (×8): qty 2

## 2020-06-02 MED ORDER — SODIUM CHLORIDE 0.9 % IV BOLUS
1000.0000 mL | Freq: Once | INTRAVENOUS | Status: AC
  Administered 2020-06-02: 1000 mL via INTRAVENOUS

## 2020-06-02 MED ORDER — INSULIN ASPART 100 UNIT/ML ~~LOC~~ SOLN
0.0000 [IU] | Freq: Three times a day (TID) | SUBCUTANEOUS | Status: DC
  Administered 2020-06-03 – 2020-06-04 (×3): 1 [IU] via SUBCUTANEOUS
  Administered 2020-06-04: 2 [IU] via SUBCUTANEOUS

## 2020-06-02 MED ORDER — FLUTICASONE PROPIONATE 50 MCG/ACT NA SUSP: 1.0000 | Freq: Every day | NASAL | Status: DC | PRN

## 2020-06-02 MED ORDER — POTASSIUM CHLORIDE CRYS ER 20 MEQ PO TBCR
40.0000 meq | EXTENDED_RELEASE_TABLET | Freq: Once | ORAL | Status: AC
  Administered 2020-06-02: 40 meq via ORAL
  Filled 2020-06-02: qty 2

## 2020-06-02 MED ORDER — ONDANSETRON HCL 4 MG PO TABS
4.0000 mg | ORAL_TABLET | Freq: Four times a day (QID) | ORAL | Status: DC | PRN
  Administered 2020-06-03: 4 mg via ORAL

## 2020-06-02 MED ORDER — ASPIRIN EC 81 MG PO TBEC
81.0000 mg | DELAYED_RELEASE_TABLET | Freq: Every day | ORAL | Status: DC
  Administered 2020-06-02 – 2020-06-04 (×3): 81 mg via ORAL
  Filled 2020-06-02 (×3): qty 1

## 2020-06-02 MED ORDER — ONDANSETRON HCL 4 MG/2ML IJ SOLN
4.0000 mg | Freq: Four times a day (QID) | INTRAMUSCULAR | Status: DC | PRN
  Administered 2020-06-02 – 2020-06-03 (×4): 4 mg via INTRAVENOUS
  Filled 2020-06-02 (×3): qty 2

## 2020-06-02 MED ORDER — LACTATED RINGERS IV SOLN: INTRAVENOUS | Status: DC

## 2020-06-02 MED ORDER — LEVOTHYROXINE SODIUM 75 MCG PO TABS
75.0000 ug | ORAL_TABLET | Freq: Every day | ORAL | Status: DC
  Administered 2020-06-02 – 2020-06-03 (×2): 75 ug via ORAL
  Filled 2020-06-02: qty 1
  Filled 2020-06-02: qty 2

## 2020-06-02 MED ORDER — CHLORHEXIDINE GLUCONATE CLOTH 2 % EX PADS
6.0000 | MEDICATED_PAD | Freq: Every day | CUTANEOUS | Status: DC
  Administered 2020-06-02 – 2020-06-04 (×3): 6 via TOPICAL

## 2020-06-02 MED ORDER — PRAVASTATIN SODIUM 40 MG PO TABS
40.0000 mg | ORAL_TABLET | ORAL | Status: DC
  Administered 2020-06-04: 40 mg via ORAL
  Filled 2020-06-02: qty 1

## 2020-06-02 MED ORDER — INSULIN ASPART 100 UNIT/ML ~~LOC~~ SOLN: 0.0000 [IU] | Freq: Every day | SUBCUTANEOUS | Status: DC

## 2020-06-02 MED ORDER — ACETAMINOPHEN 325 MG PO TABS
650.0000 mg | ORAL_TABLET | Freq: Four times a day (QID) | ORAL | Status: DC | PRN
  Administered 2020-06-02 – 2020-06-03 (×4): 650 mg via ORAL
  Filled 2020-06-02 (×4): qty 2

## 2020-06-02 MED ORDER — FLUTICASONE-UMECLIDIN-VILANT 100-62.5-25 MCG/INH IN AEPB: 1.0000 | INHALATION_SPRAY | Freq: Every day | RESPIRATORY_TRACT | Status: DC

## 2020-06-02 MED ORDER — PIPERACILLIN-TAZOBACTAM 3.375 G IVPB 30 MIN
3.3750 g | Freq: Once | INTRAVENOUS | Status: AC
  Administered 2020-06-02: 3.375 g via INTRAVENOUS
  Filled 2020-06-02: qty 50

## 2020-06-02 MED ORDER — PIPERACILLIN-TAZOBACTAM 3.375 G IVPB
3.3750 g | Freq: Three times a day (TID) | INTRAVENOUS | Status: DC
  Administered 2020-06-02: 3.375 g via INTRAVENOUS
  Filled 2020-06-02: qty 50

## 2020-06-02 MED ORDER — ACETAMINOPHEN 650 MG RE SUPP: 650.0000 mg | Freq: Four times a day (QID) | RECTAL | Status: DC | PRN

## 2020-06-02 MED ORDER — HYDROCODONE-ACETAMINOPHEN 5-325 MG PO TABS
1.0000 | ORAL_TABLET | Freq: Four times a day (QID) | ORAL | Status: DC | PRN
Start: 2020-06-02 — End: 2020-06-05
  Administered 2020-06-02: 1 via ORAL
  Filled 2020-06-02: qty 1

## 2020-06-02 MED ORDER — DUTASTERIDE 0.5 MG PO CAPS
0.5000 mg | ORAL_CAPSULE | Freq: Every day | ORAL | Status: DC
  Administered 2020-06-04: 0.5 mg via ORAL
  Filled 2020-06-02 (×6): qty 1

## 2020-06-02 MED ORDER — HEPARIN SODIUM (PORCINE) 5000 UNIT/ML IJ SOLN
5000.0000 [IU] | Freq: Three times a day (TID) | INTRAMUSCULAR | Status: DC
  Administered 2020-06-02 – 2020-06-05 (×9): 5000 [IU] via SUBCUTANEOUS
  Filled 2020-06-02 (×9): qty 1

## 2020-06-02 MED ORDER — ACETAMINOPHEN 325 MG PO TABS
650.0000 mg | ORAL_TABLET | Freq: Once | ORAL | Status: AC
  Administered 2020-06-02: 650 mg via ORAL
  Filled 2020-06-02: qty 2

## 2020-06-02 NOTE — ED Provider Notes (Signed)
Patient with fever and possible urosepsis.  Or just urinary tract infection.  I spoke with Dr. Junious Silk of urology and he felt like the hospitalist could admit the patient for fluids and antibiotics at any [facility]   Milton Ferguson, MD 06/02/20 1016

## 2020-06-02 NOTE — H&P (Addendum)
History and Physical    John [name] IRW:[id number] DOB: 1946/01/28 DOA: 06/02/2020  PCP: [name], FNP   Patient coming from: Home  Chief Complaint: AMS/Chills  HPI: John [name] is a 75 y.o. male with medical history significant for COPD with chronic hypoxemia on 3-4 L nasal cannula, type 2 diabetes, hypertension, lung cancer, CHF, hypothyroidism, dyslipidemia, and BPH with urinary retention with recent TURP on 2/24 who was just discharged yesterday with indwelling Foley catheter.  He was noted to have altered mentation by his significant other earlier this morning at approximately 1 AM as well as some shaking rigors and chills.  Therefore, EMS was called and patient was brought to the ED for further evaluation.   ED Course: Vital signs demonstrate sinus tachycardia with heart rate about 120s and temperature of 101.1 Fahrenheit.  He is also noted to have mild AKI with creatinine of 1 and baseline near 0.7.  Urinalysis demonstrates signs of pyuria.  Chest x-ray and Covid testing negative.  Patient was given Zosyn in the ED as well as a 2 L fluid bolus.  EDP had discussed with urology with plans to continue current conservative management with IV fluid and IV antibiotics.  Review of Systems: Reviewed as noted above, otherwise negative.  Past Medical History:  Diagnosis Date  . Adenocarcinoma of lung, right ([place]) 04/18/2016  . Anxiety   . Arthritis   . Asthma   . BPH (benign prostatic hyperplasia)    with urinary retention 02/06/20  . CHF (congestive heart failure) ([place])   . COPD (chronic obstructive pulmonary disease) ([place])   . Depression   . Diabetes mellitus without complication (HCC)    no meds  . Diabetes mellitus, type II ([place])   . DM (diabetes mellitus) ([place]) 07/09/2016  . Dyspnea   . History of kidney stones   . Hyperlipidemia   . Hypertension   . Hypertension   . Hypothyroidism   . Macular degeneration   . Neuropathy   . Non-small cell lung cancer, right ([place])  04/18/2016  . Peripheral vascular disease ([place])   . Prostatitis   . Pulmonary nodule, left 07/16/2016  . Sleep apnea    cpap    Past Surgical History:  Procedure Laterality Date  . ABDOMINAL AORTOGRAM W/LOWER EXTREMITY Left 02/06/2020   Procedure: ABDOMINAL AORTOGRAM W/LOWER EXTREMITY;  Surgeon: Lorretta Harp, MD;  Location: [facility];  Service: Cardiovascular;  Laterality: Left;  . CATARACT EXTRACTION, BILATERAL Bilateral   . NO PAST SURGERIES    . PORTACATH PLACEMENT Left 06/13/2016   Procedure: INSERTION PORT-A-CATH;  Surgeon: Aviva Signs, MD;  Location: [facility];  Service: General;  Laterality: Left;  . TRANSURETHRAL RESECTION OF PROSTATE N/A 05/31/2020   Procedure: TRANSURETHRAL RESECTION OF THE PROSTATE (TURP);  Surgeon: Cleon Gustin, MD;  Location: [facility];  Service: Urology;  Laterality: N/A;  . VIDEO BRONCHOSCOPY WITH ENDOBRONCHIAL NAVIGATION N/A 05/28/2016   Procedure: VIDEO BRONCHOSCOPY WITH ENDOBRONCHIAL NAVIGATION;  Surgeon: [name], MD;  Location: [facility];  Service: Thoracic;  Laterality: N/A;  . VIDEO BRONCHOSCOPY WITH ENDOBRONCHIAL ULTRASOUND N/A 05/28/2016   Procedure: VIDEO BRONCHOSCOPY WITH ENDOBRONCHIAL ULTRASOUND;  Surgeon: [name], MD;  Location: [facility];  Service: Thoracic;  Laterality: N/A;     reports that he has been smoking cigarettes. He started smoking about 59 years ago. He has a 27.50 pack-year smoking history. He has never used smokeless tobacco. He reports that he does not drink alcohol and does not use  drugs.  Allergies  Allergen Reactions  . Jardiance [Empagliflozin] Other (See Comments)    FEELS SLUGGISH, TIRED  . Lopressor [Metoprolol] Other (See Comments)    Fatigue    Family History  Problem Relation Age of Onset  . Hypertension Mother   . Diabetes Father   . Heart disease Father   . Stroke Father   . Hypertension Sister     Prior to Admission medications   Medication Sig Start Date End Date Taking?  Authorizing Provider  acetaminophen (TYLENOL) 500 MG tablet Take 1,000 mg by mouth at bedtime as needed for moderate pain.   Yes [provider]  albuterol (VENTOLIN HFA) 108 (90 Base) MCG/ACT inhaler Inhale 2 puffs into the lungs every 4 (four) hours as needed for wheezing or shortness of breath. 12/14/19  Yes Tanda Rockers, MD  aspirin EC 81 MG tablet Take 81 mg by mouth daily.   Yes [provider]  bacitracin 500 UNIT/GM ointment Apply 1 application topically 2 (two) times daily. 06/01/20  Yes McKenzie, Candee Furbish, MD  cloNIDine (CATAPRES) 0.1 MG tablet TAKE 1 TABLET (0.1 MG TOTAL) BY MOUTH 3 (THREE) TIMES DAILY. 12/19/19  Yes Hawks, Christy A, FNP  dutasteride (AVODART) 0.5 MG capsule TAKE 1 CAPSULE BY MOUTH EVERY DAY Patient taking differently: Take 0.5 mg by mouth daily. 03/26/20  Yes Hawks, Christy A, FNP  fluticasone (FLONASE) 50 MCG/ACT nasal spray Place 1 spray into both nostrils daily as needed for allergies or rhinitis.   Yes [provider]  furosemide (LASIX) 20 MG tablet Take 1 tablet (20 mg total) by mouth 2 (two) times daily. 04/25/19  Yes Hawks, Christy A, FNP  gabapentin (NEURONTIN) 300 MG capsule Take 1 capsule (300 mg total) by mouth 3 (three) times daily. Patient taking differently: Take 300-600 mg by mouth See admin instructions. Take 1 capsule (300 mg) by mouth in the morning & take 2 capsules (600 mg) by mouth in the evening. 12/16/19  Yes Hawks, Christy A, FNP  HYDROcodone-acetaminophen (NORCO) 5-325 MG tablet Take 1 tablet by mouth every 6 (six) hours as needed for moderate pain. 06/01/20  Yes McKenzie, Candee Furbish, MD  levothyroxine (SYNTHROID) 75 MCG tablet Take 1 tablet (75 mcg total) by mouth daily. 03/20/20  Yes Hawks, Christy A, FNP  linaclotide (LINZESS) 72 MCG capsule Take 1 capsule (72 mcg total) by mouth daily before breakfast. Patient taking differently: Take 72 mcg by mouth daily as needed (constipation.). 03/20/20  Yes Hawks, Christy A, FNP   losartan (COZAAR) 50 MG tablet Take 1 tablet (50 mg total) by mouth daily. 05/10/20  Yes Hawks, Christy A, FNP  meloxicam (MOBIC) 7.5 MG tablet Take 1 tablet (7.5 mg total) by mouth daily as needed for pain (back pain). 03/20/20  Yes Hawks, Christy A, FNP  metFORMIN (GLUCOPHAGE-XR) 500 MG 24 hr tablet Take 1 tablet (500 mg total) by mouth daily with breakfast. 03/20/20  Yes Hawks, Christy A, FNP  metoprolol succinate (TOPROL-XL) 25 MG 24 hr tablet Take 1.5 tablets (37.5 mg total) by mouth daily. Take with or immediately following a meal. 05/24/20 05/19/21 Yes Strader, [place] M, PA-C  pravastatin (PRAVACHOL) 40 MG tablet TAKE 1 TABLET (40 MG TOTAL) BY MOUTH ON MONDAYS , WEDNESDAYS, AND FRIDAYS AS DIRECTED Patient taking differently: Take 40 mg by mouth every Monday, Wednesday, and Friday. 05/11/20  Yes Hawks, Christy A, FNP  Semaglutide (RYBELSUS) 7 MG TABS Take 7 mg by mouth daily. 03/20/20  Yes [name], FNP  TRELEGY ELLIPTA 100-62.5-25 MCG/INH AEPB Inhale 1 puff into the lungs daily. 03/20/20  Yes Hawks, Christy A, FNP  zolpidem (AMBIEN) 5 MG tablet Take 5 mg by mouth at bedtime as needed for sleep.   Yes [provider]  Blood Glucose Monitoring Suppl (ONETOUCH VERIO REFLECT) w/Device KIT Use to test blood sugars daily as directed. DX: E11.9 10/14/19   Evelina Dun A, FNP  glucose blood (ONETOUCH VERIO) test strip Use to test blood sugars daily as directed. DX: E11.9 10/14/19   Evelina Dun A, FNP  nitrofurantoin (MACRODANTIN) 50 MG capsule Take 1 capsule (50 mg total) by mouth at bedtime. Patient not taking: Reported on 06/02/2020 05/09/20   Cleon Gustin, MD  ondansetron (ZOFRAN ODT) 4 MG disintegrating tablet Take 1 tablet (4 mg total) by mouth every 8 (eight) hours as needed for nausea or vomiting. Patient not taking: Reported on 06/02/2020 03/20/20   [name], FNP  OneTouch Delica Lancets 96V MISC Use to test blood sugars daily as directed. DX: E11.9 10/14/19   [name], FNP    Physical Exam: Vitals:   06/02/20 1000 06/02/20 1030 06/02/20 1100 06/02/20 1130  BP: 113/63 108/63 106/62 109/62  Pulse: (!) 117 (!) 111 (!) 112 (!) 108  Resp: 15 15 14  (!) 27  Temp:      TempSrc:      SpO2: 97% 96% 98% 99%    Constitutional: NAD, calm, comfortable Vitals:   06/02/20 1000 06/02/20 1030 06/02/20 1100 06/02/20 1130  BP: 113/63 108/63 106/62 109/62  Pulse: (!) 117 (!) 111 (!) 112 (!) 108  Resp: 15 15 14  (!) 27  Temp:      TempSrc:      SpO2: 97% 96% 98% 99%   Eyes: lids and conjunctivae normal Neck: normal, supple Respiratory: clear to auscultation bilaterally. Normal respiratory effort. No accessory muscle use.  Currently on 3 L nasal cannula oxygen. Cardiovascular: Regular rate and rhythm, no murmurs. Abdomen: no tenderness, no distention. Bowel sounds positive.  Musculoskeletal:  No edema. Skin: no rashes, lesions, ulcers.  Psychiatric: Flat affect Foley leg bag present with dark yellow urine output.  No discharge or bleeding noted at urethral meatus.  Labs on Admission: I have personally reviewed following labs and imaging studies  CBC: Recent Labs  Lab 05/31/20 1611 06/01/20 0537 06/02/20 0645  WBC 5.4 6.5 8.4  NEUTROABS  --   --  7.5  HGB 11.4* 11.0* 11.0*  HCT 36.0* 34.7* 35.1*  MCV 99.4 98.9 99.4  PLT 355 365 893   Basic Metabolic Panel: Recent Labs  Lab 05/31/20 1611 06/01/20 0537 06/02/20 0757  NA 139 138 137  K 4.1 3.9 3.4*  CL 103 103 100  CO2 26 27 27   GLUCOSE 100* 110* 163*  BUN 16 12 17   CREATININE 0.71 0.74 1.03  CALCIUM 8.8* 8.8* 8.8*   GFR: Estimated Creatinine Clearance: 62.7 mL/min (by C-G formula based on SCr of 1.03 mg/dL). Liver Function Tests: Recent Labs  Lab 06/02/20 0757  AST 16  ALT 13  ALKPHOS 79  BILITOT 0.4  PROT 6.3*  ALBUMIN 3.2*   No results for input(s): LIPASE, AMYLASE in the last 168 hours. No results for input(s): AMMONIA in the last 168 hours. Coagulation Profile: No  results for input(s): INR, PROTIME in the last 168 hours. Cardiac Enzymes: No results for input(s): CKTOTAL, CKMB, CKMBINDEX, TROPONINI in the last 168 hours. BNP (last 3 results) No results for input(s): PROBNP in the last  8760 hours. HbA1C: No results for input(s): HGBA1C in the last 72 hours. CBG: Recent Labs  Lab 05/31/20 1408 05/31/20 1548 05/31/20 1739 05/31/20 2112 06/01/20 0755  GLUCAP 105* 103* 95 83 108*   Lipid Profile: No results for input(s): CHOL, HDL, LDLCALC, TRIG, CHOLHDL, LDLDIRECT in the last 72 hours. Thyroid Function Tests: No results for input(s): TSH, T4TOTAL, FREET4, T3FREE, THYROIDAB in the last 72 hours. Anemia Panel: No results for input(s): VITAMINB12, FOLATE, FERRITIN, TIBC, IRON, RETICCTPCT in the last 72 hours. Urine analysis:    Component Value Date/Time   COLORURINE AMBER (A) 06/02/2020 0729   APPEARANCEUR CLOUDY (A) 06/02/2020 0729   APPEARANCEUR Clear 05/24/2020 1306   LABSPEC 1.016 06/02/2020 0729   PHURINE 6.0 06/02/2020 0729   GLUCOSEU NEGATIVE 06/02/2020 0729   HGBUR LARGE (A) 06/02/2020 0729   BILIRUBINUR NEGATIVE 06/02/2020 0729   BILIRUBINUR Negative 05/24/2020 1306   KETONESUR NEGATIVE 06/02/2020 0729   PROTEINUR 100 (A) 06/02/2020 0729   NITRITE NEGATIVE 06/02/2020 0729   LEUKOCYTESUR NEGATIVE 06/02/2020 0729    Radiological Exams on Admission: DG Chest Port 1 View  Result Date: 06/02/2020 CLINICAL DATA:  Altered mental status, asthma, congestive heart failure EXAM: PORTABLE CHEST 1 VIEW COMPARISON:  05/05/2020 FINDINGS: Lungs are clear. No pneumothorax or pleural effusion. Left subclavian chest port with its tip within the superior vena cava is unchanged. Cardiac size within normal limits. Pulmonary vascularity is normal. IMPRESSION: No active disease. Electronically Signed   By: Fidela Salisbury MD   On: 06/02/2020 06:59    EKG: Independently reviewed. Sinus tach 136 bpm.  Assessment/Plan Active Problems:   Severe sepsis  (HCC)    Severe sepsis likely secondary to UTI/prostatitis -In setting of recent TURP procedure 2/24 -Plan to continue on Zosyn for empiric treatment -Urine and blood cultures collected and pending -Plan to obtain urology input by 2/28, no urgent need for any intervention at this time -Continue IV fluid -Monitor labs  Acute metabolic encephalopathy secondary to above -Continue to monitor closely  AKI secondary to above -Baseline creatinine near 0.7 -Continue to monitor and keep off nephrotoxic agents -Monitor urine output  BPH with urinary retention -Status post recent TURP in 2/24 -Has Foley catheter  Mild hypokalemia -Replete orally and recheck in a.m.  Sinus tachycardia -Likely secondary to sepsis -Hold home metoprolol due to soft blood pressure readings  History of type 2 diabetes -Currently with stable blood glucose readings, maintain on SSI  Hypertension -Soft blood pressure readings noted and will hold agents  History of hypothyroidism -Continue Synthroid -Check TSH  History of dyslipidemia -Continue statin  DVT prophylaxis: Heparin Code Status: Full Family Communication: Discussed with significant other/caretaker at bedside Disposition Plan:Admit for treatment of sepsis/uti Consults called:EDP discussed with Urology Dr. Junious Silk Admission status: Inpatient, SDU   Asaiah Scarber D Manuella Ghazi DO [facility]  If 7PM-7AM, please contact night-coverage [email or web address]  06/02/2020, 12:50 PM

## 2020-06-02 NOTE — ED Notes (Signed)
Patient denies pain and is resting comfortably.  

## 2020-06-02 NOTE — ED Triage Notes (Signed)
Pt dc'd from hospital yesterday. Wife reports altered mental status this morning.

## 2020-06-02 NOTE — Consult Note (Signed)
Consultation: Sepsis Requested by: Dr. Milton Ferguson  History of Present Illness: John [name] is a 75 year old male with a history of urinary retention and a Foley catheter for few months.  He underwent a TURP procedure 2 days ago and was discharged from the hospital with a catheter yesterday.  Today postop day 2 he began to have fevers, chills and mental status changes.  His white count and lactic acid were okay but he has had temps to 102.8 and heart rate up to 132.  Admitted to ICU with sepsis with medicine and appreciate their excellent care.  I did note his preoperative urine culture grew Pseudomonas.  Bacteriuria would be expected with a prolonged catheter and this culture can help guide therapy.  Patient and wife also complained about some bleeding around the catheter.  Past Medical History:  Diagnosis Date  . Adenocarcinoma of lung, right ([place]) 04/18/2016  . Anxiety   . Arthritis   . Asthma   . BPH (benign prostatic hyperplasia)    with urinary retention 02/06/20  . CHF (congestive heart failure) ([place])   . COPD (chronic obstructive pulmonary disease) ([place])   . Depression   . Diabetes mellitus without complication (HCC)    no meds  . Diabetes mellitus, type II ([place])   . DM (diabetes mellitus) ([place]) 07/09/2016  . Dyspnea   . History of kidney stones   . Hyperlipidemia   . Hypertension   . Hypertension   . Hypothyroidism   . Macular degeneration   . Neuropathy   . Non-small cell lung cancer, right ([place]) 04/18/2016  . Peripheral vascular disease ([place])   . Prostatitis   . Pulmonary nodule, left 07/16/2016  . Sleep apnea    cpap   Past Surgical History:  Procedure Laterality Date  . ABDOMINAL AORTOGRAM W/LOWER EXTREMITY Left 02/06/2020   Procedure: ABDOMINAL AORTOGRAM W/LOWER EXTREMITY;  Surgeon: Lorretta Harp, MD;  Location: [facility];  Service: Cardiovascular;  Laterality: Left;  . CATARACT EXTRACTION, BILATERAL Bilateral   . NO PAST SURGERIES    . PORTACATH  PLACEMENT Left 06/13/2016   Procedure: INSERTION PORT-A-CATH;  Surgeon: Aviva Signs, MD;  Location: [facility];  Service: General;  Laterality: Left;  . TRANSURETHRAL RESECTION OF PROSTATE N/A 05/31/2020   Procedure: TRANSURETHRAL RESECTION OF THE PROSTATE (TURP);  Surgeon: Cleon Gustin, MD;  Location: [facility];  Service: Urology;  Laterality: N/A;  . VIDEO BRONCHOSCOPY WITH ENDOBRONCHIAL NAVIGATION N/A 05/28/2016   Procedure: VIDEO BRONCHOSCOPY WITH ENDOBRONCHIAL NAVIGATION;  Surgeon: [name], MD;  Location: [facility];  Service: Thoracic;  Laterality: N/A;  . VIDEO BRONCHOSCOPY WITH ENDOBRONCHIAL ULTRASOUND N/A 05/28/2016   Procedure: VIDEO BRONCHOSCOPY WITH ENDOBRONCHIAL ULTRASOUND;  Surgeon: [name], MD;  Location: [facility];  Service: Thoracic;  Laterality: N/A;    Home Medications:  Medications Prior to Admission  Medication Sig Dispense Refill Last Dose  . acetaminophen (TYLENOL) 500 MG tablet Take 1,000 mg by mouth at bedtime as needed for moderate pain.   06/01/2020 at Unknown time  . albuterol (VENTOLIN HFA) 108 (90 Base) MCG/ACT inhaler Inhale 2 puffs into the lungs every 4 (four) hours as needed for wheezing or shortness of breath. 8 g 6 05/31/2020 at Unknown time  . aspirin EC 81 MG tablet Take 81 mg by mouth daily.   05/31/2020 at Unknown time  . bacitracin 500 UNIT/GM ointment Apply 1 application topically 2 (two) times daily. 15 g 0 06/01/2020 at Unknown time  . cloNIDine (CATAPRES) 0.1  MG tablet TAKE 1 TABLET (0.1 MG TOTAL) BY MOUTH 3 (THREE) TIMES DAILY. 270 tablet 1 05/31/2020  . dutasteride (AVODART) 0.5 MG capsule TAKE 1 CAPSULE BY MOUTH EVERY DAY (Patient taking differently: Take 0.5 mg by mouth daily.) 90 capsule 1 06/01/2020 at Unknown time  . fluticasone (FLONASE) 50 MCG/ACT nasal spray Place 1 spray into both nostrils daily as needed for allergies or rhinitis.   unk  . furosemide (LASIX) 20 MG tablet Take 1 tablet (20 mg total) by mouth 2 (two) times daily. 180  tablet 1 06/01/2020 at Unknown time  . gabapentin (NEURONTIN) 300 MG capsule Take 1 capsule (300 mg total) by mouth 3 (three) times daily. (Patient taking differently: Take 300-600 mg by mouth See admin instructions. Take 1 capsule (300 mg) by mouth in the morning & take 2 capsules (600 mg) by mouth in the evening.) 270 capsule 2 06/01/2020 at Unknown time  . HYDROcodone-acetaminophen (NORCO) 5-325 MG tablet Take 1 tablet by mouth every 6 (six) hours as needed for moderate pain. 30 tablet 0 06/01/2020 at Unknown time  . levothyroxine (SYNTHROID) 75 MCG tablet Take 1 tablet (75 mcg total) by mouth daily. 90 tablet 3 06/01/2020 at Unknown time  . linaclotide (LINZESS) 72 MCG capsule Take 1 capsule (72 mcg total) by mouth daily before breakfast. (Patient taking differently: Take 72 mcg by mouth daily as needed (constipation.).) 30 capsule 2 Past Week at Unknown time  . losartan (COZAAR) 50 MG tablet Take 1 tablet (50 mg total) by mouth daily. 90 tablet 3 05/31/2020  . meloxicam (MOBIC) 7.5 MG tablet Take 1 tablet (7.5 mg total) by mouth daily as needed for pain (back pain). 90 tablet 0 unk  . metFORMIN (GLUCOPHAGE-XR) 500 MG 24 hr tablet Take 1 tablet (500 mg total) by mouth daily with breakfast. 90 tablet 1 05/30/2020  . metoprolol succinate (TOPROL-XL) 25 MG 24 hr tablet Take 1.5 tablets (37.5 mg total) by mouth daily. Take with or immediately following a meal. 135 tablet 3 06/01/2020 at PM  . pravastatin (PRAVACHOL) 40 MG tablet TAKE 1 TABLET (40 MG TOTAL) BY MOUTH ON MONDAYS , WEDNESDAYS, AND FRIDAYS AS DIRECTED (Patient taking differently: Take 40 mg by mouth every Monday, Wednesday, and Friday.) 48 tablet 3 06/01/2020 at Unknown time  . Semaglutide (RYBELSUS) 7 MG TABS Take 7 mg by mouth daily. 90 tablet 1 05/30/2020  . TRELEGY ELLIPTA 100-62.5-25 MCG/INH AEPB Inhale 1 puff into the lungs daily. 1 each 3 06/01/2020 at Unknown time  . zolpidem (AMBIEN) 5 MG tablet Take 5 mg by mouth at bedtime as needed for  sleep.   unk  . Blood Glucose Monitoring Suppl (ONETOUCH VERIO REFLECT) w/Device KIT Use to test blood sugars daily as directed. DX: E11.9 1 kit 1   . glucose blood (ONETOUCH VERIO) test strip Use to test blood sugars daily as directed. DX: E11.9 100 each 12   . nitrofurantoin (MACRODANTIN) 50 MG capsule Take 1 capsule (50 mg total) by mouth at bedtime. (Patient not taking: Reported on 06/02/2020) 30 capsule 0 Completed Course at Unknown time  . ondansetron (ZOFRAN ODT) 4 MG disintegrating tablet Take 1 tablet (4 mg total) by mouth every 8 (eight) hours as needed for nausea or vomiting. (Patient not taking: Reported on 06/02/2020) 20 tablet 0 Not Taking at Unknown time  . OneTouch Delica Lancets 25E MISC Use to test blood sugars daily as directed. DX: E11.9 100 each 1    Allergies:  Allergies  Allergen Reactions  .  Jardiance [Empagliflozin] Other (See Comments)    FEELS SLUGGISH, TIRED  . Lopressor [Metoprolol] Other (See Comments)    Fatigue    Family History  Problem Relation Age of Onset  . Hypertension Mother   . Diabetes Father   . Heart disease Father   . Stroke Father   . Hypertension Sister    Social History:  reports that he has been smoking cigarettes. He started smoking about 59 years ago. He has a 27.50 pack-year smoking history. He has never used smokeless tobacco. He reports that he does not drink alcohol and does not use drugs.  ROS: A complete review of systems was performed.  All systems are negative except for pertinent findings as noted. Review of Systems  All other systems reviewed and are negative.    Physical Exam:  Vital signs in last 24 hours: Temp:  [98.1 F (36.7 C)-102.8 F (39.3 C)] 98.6 F (37 C) (02/26 1655) Pulse Rate:  [99-132] 113 (02/26 1655) Resp:  [0-27] 20 (02/26 1655) BP: (93-173)/(57-74) 173/74 (02/26 1655) SpO2:  [90 %-100 %] 90 % (02/26 1655) Weight:  [78.6 kg] 78.6 kg (02/26 1655) General:  Alert and oriented, No acute distress, he  has shaking chills HEENT: Normocephalic, atraumatic Cardiovascular: Tachycardic Abdomen: Soft, nontender, nondistended, no abdominal masses Back: No CVA tenderness Extremities: No edema Neurologic: Grossly intact GU: Three-way Foley catheter in place.  Some mild bleeding oozing from around the catheter.  Urine clear in tubing.  Draining well.  Laboratory Data:  Results for orders placed or performed during the hospital encounter of 06/02/20 (from the past 24 hour(s))  CBC with Differential     Status: Abnormal   Collection Time: 06/02/20  6:45 AM  Result Value Ref Range   WBC 8.4 4.0 - 10.5 K/uL   RBC 3.53 (L) 4.22 - 5.81 MIL/uL   Hemoglobin 11.0 (L) 13.0 - 17.0 g/dL   HCT 35.1 (L) 39.0 - 52.0 %   MCV 99.4 80.0 - 100.0 fL   MCH 31.2 26.0 - 34.0 pg   MCHC 31.3 30.0 - 36.0 g/dL   RDW 15.9 (H) 11.5 - 15.5 %   Platelets 373 150 - 400 K/uL   nRBC 0.0 0.0 - 0.2 %   Neutrophils Relative % 91 %   Neutro Abs 7.5 1.7 - 7.7 K/uL   Lymphocytes Relative 3 %   Lymphs Abs 0.2 (L) 0.7 - 4.0 K/uL   Monocytes Relative 5 %   Monocytes Absolute 0.5 0.1 - 1.0 K/uL   Eosinophils Relative 1 %   Eosinophils Absolute 0.1 0.0 - 0.5 K/uL   Basophils Relative 0 %   Basophils Absolute 0.0 0.0 - 0.1 K/uL   Immature Granulocytes 0 %   Abs Immature Granulocytes 0.03 0.00 - 0.07 K/uL  Lactic acid, plasma     Status: None   Collection Time: 06/02/20  6:45 AM  Result Value Ref Range   Lactic Acid, Venous 1.8 0.5 - 1.9 mmol/L  Blood culture (routine x 2)     Status: None (Preliminary result)   Collection Time: 06/02/20  6:45 AM   Specimen: BLOOD RIGHT FOREARM  Result Value Ref Range   Specimen Description      BLOOD RIGHT FOREARM BOTTLES DRAWN AEROBIC AND ANAEROBIC   Special Requests      Blood Culture results may not be optimal due to an inadequate volume of blood received in culture bottles Performed at [facility], [address], [place], [place] [address]  Culture PENDING    Report Status  PENDING   Resp Panel by RT-PCR (Flu A&B, Covid) Urine, Catheterized     Status: None   Collection Time: 06/02/20  7:08 AM   Specimen: Urine, Catheterized; Nasopharyngeal(NP) swabs in vial transport medium  Result Value Ref Range   SARS Coronavirus 2 by RT PCR NEGATIVE NEGATIVE   Influenza A by PCR NEGATIVE NEGATIVE   Influenza B by PCR NEGATIVE NEGATIVE  Urinalysis, Routine w reflex microscopic Urine, Catheterized     Status: Abnormal   Collection Time: 06/02/20  7:29 AM  Result Value Ref Range   Color, Urine AMBER (A) YELLOW   APPearance CLOUDY (A) CLEAR   Specific Gravity, Urine 1.016 1.005 - 1.030   pH 6.0 5.0 - 8.0   Glucose, UA NEGATIVE NEGATIVE mg/dL   Hgb urine dipstick LARGE (A) NEGATIVE   Bilirubin Urine NEGATIVE NEGATIVE   Ketones, ur NEGATIVE NEGATIVE mg/dL   Protein, ur 100 (A) NEGATIVE mg/dL   Nitrite NEGATIVE NEGATIVE   Leukocytes,Ua NEGATIVE NEGATIVE   RBC / HPF >50 (H) 0 - 5 RBC/hpf   WBC, UA >50 (H) 0 - 5 WBC/hpf   Bacteria, UA NONE SEEN NONE SEEN   Mucus PRESENT    Non Squamous Epithelial 0-5 (A) NONE SEEN  Lactic acid, plasma     Status: None   Collection Time: 06/02/20  7:31 AM  Result Value Ref Range   Lactic Acid, Venous 1.9 0.5 - 1.9 mmol/L  Blood culture (routine x 2)     Status: None (Preliminary result)   Collection Time: 06/02/20  7:31 AM   Specimen: BLOOD RIGHT HAND  Result Value Ref Range   Specimen Description      BLOOD RIGHT HAND BOTTLES DRAWN AEROBIC AND ANAEROBIC   Special Requests      Blood Culture adequate volume Performed at [facility], [address], [place], [place] [address]    Culture PENDING    Report Status PENDING   Comprehensive metabolic panel     Status: Abnormal   Collection Time: 06/02/20  7:57 AM  Result Value Ref Range   Sodium 137 135 - 145 mmol/L   Potassium 3.4 (L) 3.5 - 5.1 mmol/L   Chloride 100 98 - 111 mmol/L   CO2 27 22 - 32 mmol/L   Glucose, Bld 163 (H) 70 - 99 mg/dL   BUN 17 8 - 23 mg/dL   Creatinine,  Ser 1.03 0.61 - 1.24 mg/dL   Calcium 8.8 (L) 8.9 - 10.3 mg/dL   Total Protein 6.3 (L) 6.5 - 8.1 g/dL   Albumin 3.2 (L) 3.5 - 5.0 g/dL   AST 16 15 - 41 U/L   ALT 13 0 - 44 U/L   Alkaline Phosphatase 79 38 - 126 U/L   Total Bilirubin 0.4 0.3 - 1.2 mg/dL   GFR, Estimated >60 >60 mL/min   Anion gap 10 5 - 15  Troponin I (High Sensitivity)     Status: None   Collection Time: 06/02/20  7:57 AM  Result Value Ref Range   Troponin I (High Sensitivity) 15 <18 ng/L  Troponin I (High Sensitivity)     Status: None   Collection Time: 06/02/20  9:57 AM  Result Value Ref Range   Troponin I (High Sensitivity) 17 <18 ng/L  CBC     Status: Abnormal   Collection Time: 06/02/20  1:16 PM  Result Value Ref Range   WBC 10.0 4.0 - 10.5 K/uL   RBC 3.20 (  L) 4.22 - 5.81 MIL/uL   Hemoglobin 10.0 (L) 13.0 - 17.0 g/dL   HCT 32.2 (L) 39.0 - 52.0 %   MCV 100.6 (H) 80.0 - 100.0 fL   MCH 31.3 26.0 - 34.0 pg   MCHC 31.1 30.0 - 36.0 g/dL   RDW 15.9 (H) 11.5 - 15.5 %   Platelets 324 150 - 400 K/uL   nRBC 0.0 0.0 - 0.2 %  Creatinine, serum     Status: None   Collection Time: 06/02/20  1:16 PM  Result Value Ref Range   Creatinine, Ser 0.96 0.61 - 1.24 mg/dL   GFR, Estimated >60 >60 mL/min  TSH     Status: Abnormal   Collection Time: 06/02/20  1:16 PM  Result Value Ref Range   TSH 0.117 (L) 0.350 - 4.500 uIU/mL  Procalcitonin - Baseline     Status: None   Collection Time: 06/02/20  1:16 PM  Result Value Ref Range   Procalcitonin 0.78 ng/mL  Glucose, capillary     Status: Abnormal   Collection Time: 06/02/20  4:59 PM  Result Value Ref Range   Glucose-Capillary 115 (H) 70 - 99 mg/dL   Recent Results (from the past 240 hour(s))  Microscopic Examination     Status: None   Collection Time: 05/24/20  1:06 PM   Urine  Result Value Ref Range Status   WBC, UA None seen 0 - 5 /hpf Final   RBC None seen 0 - 2 /hpf Final   Epithelial Cells (non renal) 0-10 0 - 10 /hpf Final   Bacteria, UA Few None seen/Few Final   Urine Culture     Status: Abnormal   Collection Time: 05/24/20  1:14 PM   Specimen: Urine   UR  Result Value Ref Range Status   Urine Culture, Routine Final report (A)  Final   Organism ID, Bacteria Pseudomonas mendocina (A)  Final    Comment: 50,000-100,000 colony forming units per mL   ORGANISM ID, BACTERIA Comment  Final    Comment: Mixed urogenital flora 10,000-25,000 colony forming units per mL    Antimicrobial Susceptibility Comment  Final    Comment:       ** S = Susceptible; I = Intermediate; R = Resistant **                    P = Positive; N = Negative             MICS are expressed in micrograms per mL    Antibiotic                 RSLT#1    RSLT#2    RSLT#3    RSLT#4 Amikacin                       S Aztreonam                      R Cefepime                       S Cefotaxime                     R Ceftazidime                    R Ceftriaxone                    R Ciprofloxacin  S Gentamicin                     S Imipenem                       S Levofloxacin                   S Meropenem                      I Piperacillin/Tazobactam        I Tetracycline                   S Ticarcillin/Clavulanate        R Tobramycin                     S Trimethoprim/Sulfa             S   SARS CORONAVIRUS 2 (TAT 6-24 HRS) Nasopharyngeal Nasopharyngeal Swab     Status: None   Collection Time: 05/29/20 11:17 AM   Specimen: Nasopharyngeal Swab  Result Value Ref Range Status   SARS Coronavirus 2 NEGATIVE NEGATIVE Final    Comment: (NOTE) SARS-CoV-2 target nucleic acids are NOT DETECTED.  The SARS-CoV-2 RNA is generally detectable in upper and lower respiratory specimens during the acute phase of infection. Negative results do not preclude SARS-CoV-2 infection, do not rule out co-infections with other pathogens, and should not be used as the sole basis for treatment or other patient management decisions. Negative results must be combined with clinical  observations, patient history, and epidemiological information. The expected result is Negative.  Fact Sheet for Patients: SugarRoll.be  Fact Sheet for Healthcare Providers: https://www.woods-mathews.com/  This test is not yet approved or cleared by the [place] FDA and  has been authorized for detection and/or diagnosis of SARS-CoV-2 by FDA under an Emergency Use Authorization (EUA). This EUA will remain  in effect (meaning this test can be used) for the duration of the COVID-19 declaration under Se ction 564(b)(1) of the Act, 21 U.S.C. section 360bbb-3(b)(1), unless the authorization is terminated or revoked sooner.  Performed at [facility], [place] [address] [place], [place] [address]   Blood culture (routine x 2)     Status: None (Preliminary result)   Collection Time: 06/02/20  6:45 AM   Specimen: BLOOD RIGHT FOREARM  Result Value Ref Range Status   Specimen Description   Final    BLOOD RIGHT FOREARM BOTTLES DRAWN AEROBIC AND ANAEROBIC   Special Requests   Final    Blood Culture results may not be optimal due to an inadequate volume of blood received in culture bottles Performed at [facility], [address], [place], [place] [address]    Culture PENDING  Incomplete   Report Status PENDING  Incomplete  Resp Panel by RT-PCR (Flu A&B, Covid) Urine, Catheterized     Status: None   Collection Time: 06/02/20  7:08 AM   Specimen: Urine, Catheterized; Nasopharyngeal(NP) swabs in vial transport medium  Result Value Ref Range Status   SARS Coronavirus 2 by RT PCR NEGATIVE NEGATIVE Final    Comment: (NOTE) SARS-CoV-2 target nucleic acids are NOT DETECTED.  The SARS-CoV-2 RNA is generally detectable in upper respiratory specimens during the acute phase of infection. The lowest concentration of SARS-CoV-2 viral copies this assay can detect is 138 copies/mL. A negative result does not preclude SARS-Cov-2 infection and should not  be used as the sole basis for treatment or other patient management decisions. A negative result may occur with  improper specimen collection/handling, submission of specimen other than nasopharyngeal swab, presence of viral mutation(s) within the areas targeted by this assay, and inadequate number of viral copies(<138 copies/mL). A negative result must be combined with clinical observations, patient history, and epidemiological information. The expected result is Negative.  Fact Sheet for Patients:  EntrepreneurPulse.com.au  Fact Sheet for Healthcare Providers:  IncredibleEmployment.be  This test is no t yet approved or cleared by the [place] FDA and  has been authorized for detection and/or diagnosis of SARS-CoV-2 by FDA under an Emergency Use Authorization (EUA). This EUA will remain  in effect (meaning this test can be used) for the duration of the COVID-19 declaration under Section 564(b)(1) of the Act, 21 U.S.C.section 360bbb-3(b)(1), unless the authorization is terminated  or revoked sooner.       Influenza A by PCR NEGATIVE NEGATIVE Final   Influenza B by PCR NEGATIVE NEGATIVE Final    Comment: (NOTE) The Xpert Xpress SARS-CoV-2/FLU/RSV plus assay is intended as an aid in the diagnosis of influenza from Nasopharyngeal swab specimens and should not be used as a sole basis for treatment. Nasal washings and aspirates are unacceptable for Xpert Xpress SARS-CoV-2/FLU/RSV testing.  Fact Sheet for Patients: EntrepreneurPulse.com.au  Fact Sheet for Healthcare Providers: IncredibleEmployment.be  This test is not yet approved or cleared by the [place] FDA and has been authorized for detection and/or diagnosis of SARS-CoV-2 by FDA under an Emergency Use Authorization (EUA). This EUA will remain in effect (meaning this test can be used) for the duration of the COVID-19 declaration under Section  564(b)(1) of the Act, 21 U.S.C. section 360bbb-3(b)(1), unless the authorization is terminated or revoked.  Performed at [facility], [address], [place], [place] [address]   Blood culture (routine x 2)     Status: None (Preliminary result)   Collection Time: 06/02/20  7:31 AM   Specimen: BLOOD RIGHT HAND  Result Value Ref Range Status   Specimen Description   Final    BLOOD RIGHT HAND BOTTLES DRAWN AEROBIC AND ANAEROBIC   Special Requests   Final    Blood Culture adequate volume Performed at [facility], [address] [place], [place] [address]    Culture PENDING  Incomplete   Report Status PENDING  Incomplete   Creatinine: Recent Labs    05/31/20 1611 06/01/20 0537 06/02/20 0757 06/02/20 1316  CREATININE 0.71 0.74 1.03 0.96    Impression/Assessment/plan:  Sepsis status post TURP-patient had a catheter for several months which certainly puts him at risk for bacteriuria.  Preop urine culture can help guide therapy.  I will notify Dr. Manuella Ghazi.  New blood and urine cultures pending.  From urologic standpoint Foley catheter in place.  Draining clear urine.  Continue Foley.  Some bleeding from around the catheter would be expected after TURP.  Appreciate excellent hospitalist care.  I will notify Dr. Alyson Ingles of admission.  Festus Aloe 06/02/2020, 6:00 PM

## 2020-06-02 NOTE — ED Provider Notes (Signed)
[facility] Provider Note   CSN: [id number] Arrival date & time: 06/02/20  0532     History Chief Complaint  Patient presents with  . Altered Mental Status    John [name] is a 75 y.o. male.  Patient is a 75 year old male with extensive past medical history including COPD, diabetes, CHF, hypertension, lung cancer, and BPH with urinary retention and recent TURP performed by Dr. Alyson Ingles.  Patient was just discharged yesterday from the hospital after undergoing this procedure.  Patient has an indwelling Foley catheter.  He presents today for altered mental status.  From what I am told, patient appeared more confused and disoriented this morning and EMS was called.  Patient was brought here tachycardic and apparently confused.  He denies to me he has any specific pain or other complaints.  The history is provided by the patient.  Altered Mental Status Presenting symptoms: disorientation   Severity:  Moderate Most recent episode:  Today Episode history:  Single Timing:  Constant Progression:  Unchanged Chronicity:  New Associated symptoms comment:  Recent prostate surgery      Past Medical History:  Diagnosis Date  . Adenocarcinoma of lung, right ([place]) 04/18/2016  . Anxiety   . Arthritis   . Asthma   . BPH (benign prostatic hyperplasia)    with urinary retention 02/06/20  . CHF (congestive heart failure) ([place])   . COPD (chronic obstructive pulmonary disease) ([place])   . Depression   . Diabetes mellitus without complication (HCC)    no meds  . Diabetes mellitus, type II ([place])   . DM (diabetes mellitus) ([place]) 07/09/2016  . Dyspnea   . History of kidney stones   . Hyperlipidemia   . Hypertension   . Hypertension   . Hypothyroidism   . Macular degeneration   . Neuropathy   . Non-small cell lung cancer, right ([place]) 04/18/2016  . Peripheral vascular disease ([place])   . Prostatitis   . Pulmonary nodule, left 07/16/2016  . Sleep apnea    cpap    Patient  Active Problem List   Diagnosis Date Noted  . Sepsis due to Escherichia coli (E. coli) ([place]) 05/07/2020  . Hypokalemia 05/06/2020  . Hyperglycemia due to diabetes mellitus ([place]) 05/06/2020  . Hypoalbuminemia 05/06/2020  . Prolonged QT interval 05/06/2020  . Lactic acidosis 05/06/2020  . Bronchitis due to tobacco use 05/06/2020  . Acute respiratory failure with hypoxia ([place]) 05/06/2020  . Severe sepsis ([place]) 05/06/2020  . Acute on chronic respiratory failure with hypoxia ([place]) 05/06/2020  . COPD with acute exacerbation ([place]) 05/06/2020  . Tobacco abuse 05/06/2020  . Urinary retention 03/09/2020  . Acute cystitis with hematuria   . Complicated UTI (urinary tract infection)   . Dyspnea   . Hypoxia   . Sepsis secondary to UTI ([place]) 03/03/2020  . COPD GOLD III/ group D still smoking  10/24/2019  . PAD (peripheral artery disease) ([place]) 09/13/2019  . Obstructive sleep apnea treated with continuous positive airway pressure (CPAP) 05/31/2018  . Diabetic neuropathy ([place]) 12/29/2017  . Cigarette smoker 12/29/2017  . Hyperlipidemia associated with type 2 diabetes mellitus ([place]) 12/29/2017  . Primary insomnia 12/29/2017  . Primary osteoarthritis involving multiple joints 12/29/2017  . OSA (obstructive sleep apnea) 12/10/2017  . Hypothyroidism 06/09/2017  . Joint pain 08/05/2016  . Bilateral hand swelling 08/05/2016  . Hyponatremia 08/05/2016  . Pulmonary nodule, left 07/16/2016  . CHF (congestive heart failure) ([place]) 07/09/2016  . DM (diabetes mellitus) ([place]) 07/09/2016  . Non-small  cell lung cancer, right ([place]) 04/18/2016  . Essential hypertension 02/01/2016  . COPD (chronic obstructive pulmonary disease) ([place]) 02/01/2016  . BPH with urinary obstruction 02/01/2016    Past Surgical History:  Procedure Laterality Date  . ABDOMINAL AORTOGRAM W/LOWER EXTREMITY Left 02/06/2020   Procedure: ABDOMINAL AORTOGRAM W/LOWER EXTREMITY;  Surgeon: Lorretta Harp, MD;  Location: [facility];  Service: Cardiovascular;  Laterality: Left;  . CATARACT EXTRACTION, BILATERAL Bilateral   . NO PAST SURGERIES    . PORTACATH PLACEMENT Left 06/13/2016   Procedure: INSERTION PORT-A-CATH;  Surgeon: Aviva Signs, MD;  Location: [facility];  Service: General;  Laterality: Left;  . TRANSURETHRAL RESECTION OF PROSTATE N/A 05/31/2020   Procedure: TRANSURETHRAL RESECTION OF THE PROSTATE (TURP);  Surgeon: Cleon Gustin, MD;  Location: [facility];  Service: Urology;  Laterality: N/A;  . VIDEO BRONCHOSCOPY WITH ENDOBRONCHIAL NAVIGATION N/A 05/28/2016   Procedure: VIDEO BRONCHOSCOPY WITH ENDOBRONCHIAL NAVIGATION;  Surgeon: [name], MD;  Location: [facility];  Service: Thoracic;  Laterality: N/A;  . VIDEO BRONCHOSCOPY WITH ENDOBRONCHIAL ULTRASOUND N/A 05/28/2016   Procedure: VIDEO BRONCHOSCOPY WITH ENDOBRONCHIAL ULTRASOUND;  Surgeon: [name], MD;  Location: [facility];  Service: Thoracic;  Laterality: N/A;       Family History  Problem Relation Age of Onset  . Hypertension Mother   . Diabetes Father   . Heart disease Father   . Stroke Father   . Hypertension Sister     Social History   Tobacco Use  . Smoking status: Current Every Day Smoker    Packs/day: 0.50    Years: 55.00    Pack years: 27.50    Types: Cigarettes    Start date: 03/11/1961  . Smokeless tobacco: Never Used  . Tobacco comment: 1/2 pack to 1 pack per day 12/14/19  Vaping Use  . Vaping Use: Never used  Substance Use Topics  . Alcohol use: No  . Drug use: No    Home Medications Prior to Admission medications   Medication Sig Start Date End Date Taking? Authorizing Provider  bacitracin 500 UNIT/GM ointment Apply 1 application topically 2 (two) times daily. 06/01/20   McKenzie, Candee Furbish, MD  acetaminophen (TYLENOL) 500 MG tablet Take 1,000 mg by mouth at bedtime as needed for moderate pain.    [provider]  acetaminophen (TYLENOL) 650 MG CR tablet Take 650 mg by mouth every 8 (eight) hours as  needed for pain.    [provider]  albuterol (VENTOLIN HFA) 108 (90 Base) MCG/ACT inhaler Inhale 2 puffs into the lungs every 4 (four) hours as needed for wheezing or shortness of breath. 12/14/19   Tanda Rockers, MD  aspirin EC 81 MG tablet Take 81 mg by mouth daily.    [provider]  Blood Glucose Monitoring Suppl (ONETOUCH VERIO REFLECT) w/Device KIT Use to test blood sugars daily as directed. DX: E11.9 10/14/19   Evelina Dun A, FNP  cloNIDine (CATAPRES) 0.1 MG tablet TAKE 1 TABLET (0.1 MG TOTAL) BY MOUTH 3 (THREE) TIMES DAILY. 12/19/19   Evelina Dun A, FNP  dutasteride (AVODART) 0.5 MG capsule TAKE 1 CAPSULE BY MOUTH EVERY DAY Patient taking differently: Take 0.5 mg by mouth daily. 03/26/20   [name], FNP  fluticasone (FLONASE) 50 MCG/ACT nasal spray Place 1 spray into both nostrils daily as needed for allergies or rhinitis.    [provider]  furosemide (LASIX) 20 MG tablet Take 1 tablet (20 mg total) by mouth 2 (  two) times daily. 04/25/19   [name], FNP  gabapentin (NEURONTIN) 300 MG capsule Take 1 capsule (300 mg total) by mouth 3 (three) times daily. Patient taking differently: Take 300-600 mg by mouth See admin instructions. Take 1 capsule (300 mg) by mouth in the morning & take 2 capsules (600 mg) by mouth in the evening. 12/16/19   Evelina Dun A, FNP  glucose blood (ONETOUCH VERIO) test strip Use to test blood sugars daily as directed. DX: E11.9 10/14/19   Evelina Dun A, FNP  HYDROcodone-acetaminophen (NORCO) 5-325 MG tablet Take 1 tablet by mouth every 6 (six) hours as needed for moderate pain. 06/01/20   McKenzie, Candee Furbish, MD  levothyroxine (SYNTHROID) 75 MCG tablet Take 1 tablet (75 mcg total) by mouth daily. 03/20/20   [name], FNP  linaclotide (LINZESS) 72 MCG capsule Take 1 capsule (72 mcg total) by mouth daily before breakfast. Patient taking differently: Take 72 mcg by mouth daily as needed (constipation.). 03/20/20    [name], FNP  losartan (COZAAR) 50 MG tablet Take 1 tablet (50 mg total) by mouth daily. 05/10/20   [name], FNP  meloxicam (MOBIC) 7.5 MG tablet Take 1 tablet (7.5 mg total) by mouth daily as needed for pain (back pain). 03/20/20   [name], FNP  metFORMIN (GLUCOPHAGE-XR) 500 MG 24 hr tablet Take 1 tablet (500 mg total) by mouth daily with breakfast. 03/20/20   Evelina Dun A, FNP  metoprolol succinate (TOPROL-XL) 25 MG 24 hr tablet Take 1.5 tablets (37.5 mg total) by mouth daily. Take with or immediately following a meal. 05/24/20 05/19/21  Strader, Fransisco Hertz, PA-C  nitrofurantoin (MACRODANTIN) 50 MG capsule Take 1 capsule (50 mg total) by mouth at bedtime. 05/09/20   McKenzie, Candee Furbish, MD  ondansetron (ZOFRAN ODT) 4 MG disintegrating tablet Take 1 tablet (4 mg total) by mouth every 8 (eight) hours as needed for nausea or vomiting. 03/20/20   [name], FNP  OneTouch Delica Lancets 29J MISC Use to test blood sugars daily as directed. DX: E11.9 10/14/19   Evelina Dun A, FNP  pravastatin (PRAVACHOL) 40 MG tablet TAKE 1 TABLET (40 MG TOTAL) BY MOUTH ON MONDAYS , WEDNESDAYS, AND FRIDAYS AS DIRECTED Patient taking differently: Take 40 mg by mouth every Monday, Wednesday, and Friday. 05/11/20   Hawks, Christy A, FNP  Semaglutide (RYBELSUS) 7 MG TABS Take 7 mg by mouth daily. 03/20/20   Hawks, Christy A, FNP  TRELEGY ELLIPTA 100-62.5-25 MCG/INH AEPB Inhale 1 puff into the lungs daily. 03/20/20   Evelina Dun A, FNP  zolpidem (AMBIEN) 5 MG tablet Take 5 mg by mouth at bedtime as needed for sleep.    [provider]    Allergies    Jardiance [empagliflozin] and Lopressor [metoprolol]  Review of Systems   Review of Systems  All other systems reviewed and are negative.   Physical Exam Updated Vital Signs BP 112/67   Pulse (!) 128   Temp 99.4 F (37.4 C) (Oral)   Resp 20   SpO2 98%   Physical Exam Vitals and nursing note reviewed.  Constitutional:       General: He is not in acute distress.    Appearance: He is well-developed and well-nourished. He is not diaphoretic.  HENT:     Head: Normocephalic and atraumatic.     Mouth/Throat:     Mouth: Oropharynx is clear and moist. Mucous membranes are moist.  Cardiovascular:     Rate  and Rhythm: Regular rhythm. Tachycardia present.     Heart sounds: No murmur heard. No friction rub.  Pulmonary:     Effort: Pulmonary effort is normal. No respiratory distress.     Breath sounds: Normal breath sounds. No wheezing or rales.  Abdominal:     General: Bowel sounds are normal. There is no distension.     Palpations: Abdomen is soft.     Tenderness: There is no abdominal tenderness.  Musculoskeletal:        General: No swelling, tenderness or edema. Normal range of motion.     Cervical back: Normal range of motion and neck supple. No rigidity or tenderness.     Right lower leg: No edema.     Left lower leg: No edema.  Lymphadenopathy:     Cervical: No cervical adenopathy.  Skin:    General: Skin is warm and dry.  Neurological:     Mental Status: He is alert and oriented to person, place, and time.     Coordination: Coordination normal.     ED Results / Procedures / Treatments   Labs (all labs ordered are listed, but only abnormal results are displayed) Labs Reviewed  CULTURE, BLOOD (ROUTINE X 2)  CULTURE, BLOOD (ROUTINE X 2)  URINE CULTURE  COMPREHENSIVE METABOLIC PANEL  CBC WITH DIFFERENTIAL/PLATELET  LACTIC ACID, PLASMA  LACTIC ACID, PLASMA  URINALYSIS, ROUTINE W REFLEX MICROSCOPIC  TROPONIN I (HIGH SENSITIVITY)    EKG EKG Interpretation  Date/Time:  Saturday June 02 2020 06:43:40 EST Ventricular Rate:  136 PR Interval:    QRS Duration: 117 QT Interval:  325 QTC Calculation: 489 R Axis:   14 Text Interpretation: Sinus or ectopic atrial tachycardia Incomplete right bundle branch block Inferior infarct, old Confirmed by Veryl Speak [phone]) on 06/02/2020 6:54:22  AM   Radiology No results found.  Procedures Procedures   Medications Ordered in ED Medications  sodium chloride 0.9 % bolus 1,000 mL (has no administration in time range)    ED Course  I have reviewed the triage vital signs and the nursing notes.  Pertinent labs & imaging results that were available during my care of the patient were reviewed by me and considered in my medical decision making (see chart for details).    MDM Rules/Calculators/A&P  Patient with recent admission for TURP, discharged yesterday presenting with complaints of confusion and tachycardia.  Patient borderline febrile with temp of 99.4.  His blood pressure is stable and vitals are otherwise unremarkable.  Laboratory studies collected including CBC, basic metabolic, blood cultures, lactate, urinalysis, and head CT.  These studies are all pending.  Care will be signed out to Dr. Roderic Palau at shift change.  He will obtain the results of the above studies and determine the final disposition.  Patient with tachycardia, but no other SIRS criteria.  This may well be evolving sepsis, but am unable to declare this at this point.  Final Clinical Impression(s) / ED Diagnoses Final diagnoses:  None    Rx / DC Orders ED Discharge Orders    None       Veryl Speak, MD 06/03/20 [phone]

## 2020-06-02 NOTE — Progress Notes (Signed)
Pharmacy Antibiotic Note  John [name] is a 75 y.o. male admitted on 06/02/2020 with sepsis.  Pharmacy has been consulted for Zosyn dosing.  Plan: Zosyn 3.375g IV q8h (4 hour infusion).     Temp (24hrs), Avg:101.1 F (38.4 C), Min:99.4 F (37.4 C), Max:102.8 F (39.3 C)  Recent Labs  Lab 05/31/20 1611 06/01/20 0537 06/02/20 0645 06/02/20 0731 06/02/20 0757  WBC 5.4 6.5 8.4  --   --   CREATININE 0.71 0.74  --   --  1.03  LATICACIDVEN  --   --  1.8 1.9  --     Estimated Creatinine Clearance: 62.7 mL/min (by C-G formula based on SCr of 1.03 mg/dL).    Allergies  Allergen Reactions  . Jardiance [Empagliflozin] Other (See Comments)    FEELS SLUGGISH, TIRED  . Lopressor [Metoprolol] Other (See Comments)    Fatigue    Antimicrobials this admission: Zosyn 2/26 >>   Dose adjustments this admission:   Microbiology results:  BCx:   UCx:    Sputum:    MRSA PCR:   Thank you for allowing pharmacy to be a part of this patient's care.  Hart Robinsons A 06/02/2020 11:55 AM

## 2020-06-02 NOTE — ED Notes (Signed)
Family updated as to patient's status.

## 2020-06-03 DIAGNOSIS — A419 Sepsis, unspecified organism: Secondary | ICD-10-CM | POA: Diagnosis not present

## 2020-06-03 DIAGNOSIS — R6520 Severe sepsis without septic shock: Secondary | ICD-10-CM | POA: Diagnosis not present

## 2020-06-03 LAB — URINE CULTURE
Culture: NO GROWTH
Special Requests: NORMAL

## 2020-06-03 LAB — BASIC METABOLIC PANEL
Anion gap: 8 (ref 5–15)
BUN: 16 mg/dL (ref 8–23)
CO2: 28 mmol/L (ref 22–32)
Calcium: 9 mg/dL (ref 8.9–10.3)
Chloride: 104 mmol/L (ref 98–111)
Creatinine, Ser: 0.83 mg/dL (ref 0.61–1.24)
GFR, Estimated: >60 mL/min (ref >60)
Glucose, Bld: 111 mg/dL — ABNORMAL HIGH (ref 70–99)
Potassium: 4.4 mmol/L (ref 3.5–5.1)
Sodium: 140 mmol/L (ref 135–145)

## 2020-06-03 LAB — T4, FREE: Free T4: 1.96 ng/dL — ABNORMAL HIGH (ref 0.61–1.12)

## 2020-06-03 LAB — CBC
HCT: 36.5 % — ABNORMAL LOW (ref 39.0–52.0)
Hemoglobin: 11.2 g/dL — ABNORMAL LOW (ref 13.0–17.0)
MCH: 31.3 pg (ref 26.0–34.0)
MCHC: 30.7 g/dL (ref 30.0–36.0)
MCV: 102 fL — ABNORMAL HIGH (ref 80.0–100.0)
Platelets: 308 10*3/uL (ref 150–400)
RBC: 3.58 MIL/uL — ABNORMAL LOW (ref 4.22–5.81)
RDW: 15.8 % — ABNORMAL HIGH (ref 11.5–15.5)
WBC: 6.9 10*3/uL (ref 4.0–10.5)
nRBC: 0 % (ref 0.0–0.2)

## 2020-06-03 LAB — GLUCOSE, CAPILLARY
Glucose-Capillary: 136 mg/dL — ABNORMAL HIGH (ref 70–99)
Glucose-Capillary: 129 mg/dL — ABNORMAL HIGH (ref 70–99)
Glucose-Capillary: 115 mg/dL — ABNORMAL HIGH (ref 70–99)
Glucose-Capillary: 134 mg/dL — ABNORMAL HIGH (ref 70–99)

## 2020-06-03 LAB — PROCALCITONIN: Procalcitonin: 0.86 ng/mL

## 2020-06-03 LAB — MAGNESIUM: Magnesium: 2 mg/dL (ref 1.7–2.4)

## 2020-06-03 MED ORDER — ALBUTEROL SULFATE HFA 108 (90 BASE) MCG/ACT IN AERS: 2.0000 | INHALATION_SPRAY | RESPIRATORY_TRACT | Status: DC | PRN

## 2020-06-03 MED ORDER — GABAPENTIN 300 MG PO CAPS
600.0000 mg | ORAL_CAPSULE | Freq: Every day | ORAL | Status: DC
  Administered 2020-06-03 – 2020-06-04 (×2): 600 mg via ORAL
  Filled 2020-06-03 (×2): qty 2

## 2020-06-03 MED ORDER — ALBUTEROL SULFATE (2.5 MG/3ML) 0.083% IN NEBU
2.5000 mg | INHALATION_SOLUTION | RESPIRATORY_TRACT | Status: DC | PRN
  Administered 2020-06-03: 2.5 mg via RESPIRATORY_TRACT
  Filled 2020-06-03: qty 3

## 2020-06-03 MED ORDER — IPRATROPIUM BROMIDE 0.02 % IN SOLN
RESPIRATORY_TRACT | Status: AC
  Administered 2020-06-03: 0.5 mg
  Filled 2020-06-03: qty 2.5

## 2020-06-03 MED ORDER — METOPROLOL SUCCINATE ER 25 MG PO TB24
37.5000 mg | ORAL_TABLET | Freq: Every day | ORAL | Status: DC
  Administered 2020-06-03 – 2020-06-04 (×2): 37.5 mg via ORAL
  Filled 2020-06-03 (×2): qty 2

## 2020-06-03 MED ORDER — GABAPENTIN 300 MG PO CAPS
300.0000 mg | ORAL_CAPSULE | Freq: Every day | ORAL | Status: DC
  Administered 2020-06-03 – 2020-06-04 (×2): 300 mg via ORAL
  Filled 2020-06-03 (×2): qty 1

## 2020-06-03 MED ORDER — ALBUTEROL SULFATE HFA 108 (90 BASE) MCG/ACT IN AERS
INHALATION_SPRAY | RESPIRATORY_TRACT | Status: AC
  Administered 2020-06-03: 2
  Filled 2020-06-03: qty 6.7

## 2020-06-03 NOTE — Progress Notes (Signed)
PROGRESS NOTE    John [name]  LEX:[id number] DOB: 03-Feb-1946 DOA: 06/02/2020 PCP: [name], FNP   Brief Narrative:  Per HPI: John [name] is a 75 y.o. male with medical history significant for COPD with chronic hypoxemia on 3-4 L nasal cannula, type 2 diabetes, hypertension, lung cancer, CHF, hypothyroidism, dyslipidemia, and BPH with urinary retention with recent TURP on 2/24 who was just discharged yesterday with indwelling Foley catheter.  He was noted to have altered mentation by his significant other earlier this morning at approximately 1 AM as well as some shaking rigors and chills.  Therefore, EMS was called and patient was brought to the ED for further evaluation.  -Patient was admitted with severe sepsis secondary to recent TURP on 2/24.  He was initially started on IV Zosyn and switched over to cefepime based on recent urine culture and sensitivity results.  Assessment & Plan:   Active Problems:   Severe sepsis (HCC)   Severe sepsis likely secondary to UTI/prostatitis -In setting of recent TURP procedure 2/24 -Changed to cefepime on 2/26 per urology recommendations based on last urine culture -Urine cultures with no growth and blood cultures with no growth thus far -Plan to obtain further urology input by 2/28, no urgent need for any intervention at this time -DC IV fluid -Monitor labs  Acute metabolic encephalopathy secondary to above -Continue to monitor closely  AKI secondary to above-improving -Baseline creatinine near 0.7 -Continue to monitor and keep off nephrotoxic agents -Monitor urine output -No need for further IV fluid  BPH with urinary retention -Status post recent TURP in 2/24 -Has Foley catheter  Mild hypokalemia -Resolved, continue to monitor  Sinus tachycardia -Likely secondary to sepsis -Resume home metoprolol today as blood pressures are improved  History of type 2 diabetes -Currently with stable blood glucose readings,  maintain on SSI  Hypertension -Ok to resume metoprolol today  History of hypothyroidism -Synthroid currently held with TSH 0.11 -Plan to check free T4 and T3  History of dyslipidemia -Continue statin   DVT prophylaxis:Heparin Code Status: Full Family Communication: discussed with spouse/caretaker Judy on 2/27 Disposition Plan:  Status is: Inpatient  Remains inpatient appropriate because:IV treatments appropriate due to intensity of illness or inability to take PO and Inpatient level of care appropriate due to severity of illness   Dispo: The patient is from: Home              Anticipated d/c is to: Home              Patient currently is not medically stable to d/c.   Difficult to place patient No  Consultants:   Urology  Procedures:   See below  Antimicrobials:  Anti-infectives (From admission, onward)   Start     Dose/Rate Route Frequency Ordered Stop   06/02/20 2100  ceFEPIme (MAXIPIME) 2 g in sodium chloride 0.9 % 100 mL IVPB        2 g 200 mL/hr over 30 Minutes Intravenous Every 8 hours 06/02/20 1911     06/02/20 1300  piperacillin-tazobactam (ZOSYN) IVPB 3.375 g  Status:  Discontinued        3.375 g 12.5 mL/hr over 240 Minutes Intravenous Every 8 hours 06/02/20 1154 06/02/20 1808   06/02/20 0745  piperacillin-tazobactam (ZOSYN) IVPB 3.375 g        3.375 g 100 mL/hr over 30 Minutes Intravenous  Once 06/02/20 0739 06/02/20 4496       Subjective: Patient seen and evaluated today with no new  acute complaints or concerns. No acute concerns or events noted overnight.  He is overall starting to feel much better and is awake and conversational.  Objective: Vitals:   06/03/20 0919 06/03/20 0951 06/03/20 1000 06/03/20 1118  BP: (!) 152/62 (!) 152/62 (!) 149/62   Pulse: (!) 107 (!) 106 (!) 110 (!) 106  Resp: 18  19 18   Temp:    97.8 F (36.6 C)  TempSrc:    Oral  SpO2: 100%   100%  Weight:      Height:        Intake/Output Summary (Last 24 hours) at  06/03/2020 1151 Last data filed at 06/03/2020 0648 Gross per 24 hour  Intake 1448.71 ml  Output 840 ml  Net 608.71 ml   Filed Weights   06/02/20 1655  Weight: 78.6 kg    Examination:  General exam: Appears calm and comfortable  Respiratory system: Clear to auscultation. Respiratory effort normal. Cardiovascular system: S1 & S2 heard, RRR.  Tachycardic. Gastrointestinal system: Abdomen is soft Central nervous system: Alert and awake Extremities: No edema Skin: No significant lesions noted Psychiatry: Flat affect. Foley with dark, yellow, but clear output.    Data Reviewed: I have personally reviewed following labs and imaging studies  CBC: Recent Labs  Lab 05/31/20 1611 06/01/20 0537 06/02/20 0645 06/02/20 1316 06/03/20 0447  WBC 5.4 6.5 8.4 10.0 6.9  NEUTROABS  --   --  7.5  --   --   HGB 11.4* 11.0* 11.0* 10.0* 11.2*  HCT 36.0* 34.7* 35.1* 32.2* 36.5*  MCV 99.4 98.9 99.4 100.6* 102.0*  PLT 355 365 373 324 326   Basic Metabolic Panel: Recent Labs  Lab 05/31/20 1611 06/01/20 0537 06/02/20 0757 06/02/20 1316 06/03/20 0447  NA 139 138 137  --  140  K 4.1 3.9 3.4*  --  4.4  CL 103 103 100  --  104  CO2 26 27 27   --  28  GLUCOSE 100* 110* 163*  --  111*  BUN 16 12 17   --  16  CREATININE 0.71 0.74 1.03 0.96 0.83  CALCIUM 8.8* 8.8* 8.8*  --  9.0  MG  --   --   --   --  2.0   GFR: Estimated Creatinine Clearance: 73 mL/min (by C-G formula based on SCr of 0.83 mg/dL). Liver Function Tests: Recent Labs  Lab 06/02/20 0757  AST 16  ALT 13  ALKPHOS 79  BILITOT 0.4  PROT 6.3*  ALBUMIN 3.2*   No results for input(s): LIPASE, AMYLASE in the last 168 hours. No results for input(s): AMMONIA in the last 168 hours. Coagulation Profile: No results for input(s): INR, PROTIME in the last 168 hours. Cardiac Enzymes: No results for input(s): CKTOTAL, CKMB, CKMBINDEX, TROPONINI in the last 168 hours. BNP (last 3 results) No results for input(s): PROBNP in the last  8760 hours. HbA1C: No results for input(s): HGBA1C in the last 72 hours. CBG: Recent Labs  Lab 06/01/20 0755 06/02/20 1659 06/02/20 2326 06/03/20 0733 06/03/20 1117  GLUCAP 108* 115* 134* 136* 129*   Lipid Profile: No results for input(s): CHOL, HDL, LDLCALC, TRIG, CHOLHDL, LDLDIRECT in the last 72 hours. Thyroid Function Tests: Recent Labs    06/02/20 1316  TSH 0.117*   Anemia Panel: No results for input(s): VITAMINB12, FOLATE, FERRITIN, TIBC, IRON, RETICCTPCT in the last 72 hours. Sepsis Labs: Recent Labs  Lab 06/02/20 0645 06/02/20 0731 06/02/20 1316 06/03/20 0447  PROCALCITON  --   --  0.78 0.86  LATICACIDVEN 1.8 1.9  --   --     Recent Results (from the past 240 hour(s))  Microscopic Examination     Status: None   Collection Time: 05/24/20  1:06 PM   Urine  Result Value Ref Range Status   WBC, UA None seen 0 - 5 /hpf Final   RBC None seen 0 - 2 /hpf Final   Epithelial Cells (non renal) 0-10 0 - 10 /hpf Final   Bacteria, UA Few None seen/Few Final  Urine Culture     Status: Abnormal   Collection Time: 05/24/20  1:14 PM   Specimen: Urine   UR  Result Value Ref Range Status   Urine Culture, Routine Final report (A)  Final   Organism ID, Bacteria Pseudomonas mendocina (A)  Final    Comment: 50,000-100,000 colony forming units per mL   ORGANISM ID, BACTERIA Comment  Final    Comment: Mixed urogenital flora 10,000-25,000 colony forming units per mL    Antimicrobial Susceptibility Comment  Final    Comment:       ** S = Susceptible; I = Intermediate; R = Resistant **                    P = Positive; N = Negative             MICS are expressed in micrograms per mL    Antibiotic                 RSLT#1    RSLT#2    RSLT#3    RSLT#4 Amikacin                       S Aztreonam                      R Cefepime                       S Cefotaxime                     R Ceftazidime                    R Ceftriaxone                    R Ciprofloxacin                   S Gentamicin                     S Imipenem                       S Levofloxacin                   S Meropenem                      I Piperacillin/Tazobactam        I Tetracycline                   S Ticarcillin/Clavulanate        R Tobramycin                     S Trimethoprim/Sulfa             S   SARS CORONAVIRUS 2 (TAT  6-24 HRS) Nasopharyngeal Nasopharyngeal Swab     Status: None   Collection Time: 05/29/20 11:17 AM   Specimen: Nasopharyngeal Swab  Result Value Ref Range Status   SARS Coronavirus 2 NEGATIVE NEGATIVE Final    Comment: (NOTE) SARS-CoV-2 target nucleic acids are NOT DETECTED.  The SARS-CoV-2 RNA is generally detectable in upper and lower respiratory specimens during the acute phase of infection. Negative results do not preclude SARS-CoV-2 infection, do not rule out co-infections with other pathogens, and should not be used as the sole basis for treatment or other patient management decisions. Negative results must be combined with clinical observations, patient history, and epidemiological information. The expected result is Negative.  Fact Sheet for Patients: SugarRoll.be  Fact Sheet for Healthcare Providers: https://www.woods-mathews.com/  This test is not yet approved or cleared by the [place] FDA and  has been authorized for detection and/or diagnosis of SARS-CoV-2 by FDA under an Emergency Use Authorization (EUA). This EUA will remain  in effect (meaning this test can be used) for the duration of the COVID-19 declaration under Se ction 564(b)(1) of the Act, 21 U.S.C. section 360bbb-3(b)(1), unless the authorization is terminated or revoked sooner.  Performed at [facility], [place] [address], [place], [place] [address]   Blood culture (routine x 2)     Status: None (Preliminary result)   Collection Time: 06/02/20  6:45 AM   Specimen: BLOOD RIGHT FOREARM  Result Value Ref Range Status   Specimen  Description   Final    BLOOD RIGHT FOREARM BOTTLES DRAWN AEROBIC AND ANAEROBIC   Special Requests   Final    Blood Culture results may not be optimal due to an inadequate volume of blood received in culture bottles   Culture   Final    NO GROWTH 1 DAY Performed at [facility], [address], [place], [place] [address]    Report Status PENDING  Incomplete  Resp Panel by RT-PCR (Flu A&B, Covid) Urine, Catheterized     Status: None   Collection Time: 06/02/20  7:08 AM   Specimen: Urine, Catheterized; Nasopharyngeal(NP) swabs in vial transport medium  Result Value Ref Range Status   SARS Coronavirus 2 by RT PCR NEGATIVE NEGATIVE Final    Comment: (NOTE) SARS-CoV-2 target nucleic acids are NOT DETECTED.  The SARS-CoV-2 RNA is generally detectable in upper respiratory specimens during the acute phase of infection. The lowest concentration of SARS-CoV-2 viral copies this assay can detect is 138 copies/mL. A negative result does not preclude SARS-Cov-2 infection and should not be used as the sole basis for treatment or other patient management decisions. A negative result may occur with  improper specimen collection/handling, submission of specimen other than nasopharyngeal swab, presence of viral mutation(s) within the areas targeted by this assay, and inadequate number of viral copies(<138 copies/mL). A negative result must be combined with clinical observations, patient history, and epidemiological information. The expected result is Negative.  Fact Sheet for Patients:  EntrepreneurPulse.com.au  Fact Sheet for Healthcare Providers:  IncredibleEmployment.be  This test is no t yet approved or cleared by the [place] FDA and  has been authorized for detection and/or diagnosis of SARS-CoV-2 by FDA under an Emergency Use Authorization (EUA). This EUA will remain  in effect (meaning this test can be used) for the duration of the COVID-19  declaration under Section 564(b)(1) of the Act, 21 U.S.C.section 360bbb-3(b)(1), unless the authorization is terminated  or revoked sooner.       Influenza A by  PCR NEGATIVE NEGATIVE Final   Influenza B by PCR NEGATIVE NEGATIVE Final    Comment: (NOTE) The Xpert Xpress SARS-CoV-2/FLU/RSV plus assay is intended as an aid in the diagnosis of influenza from Nasopharyngeal swab specimens and should not be used as a sole basis for treatment. Nasal washings and aspirates are unacceptable for Xpert Xpress SARS-CoV-2/FLU/RSV testing.  Fact Sheet for Patients: EntrepreneurPulse.com.au  Fact Sheet for Healthcare Providers: IncredibleEmployment.be  This test is not yet approved or cleared by the [place] FDA and has been authorized for detection and/or diagnosis of SARS-CoV-2 by FDA under an Emergency Use Authorization (EUA). This EUA will remain in effect (meaning this test can be used) for the duration of the COVID-19 declaration under Section 564(b)(1) of the Act, 21 U.S.C. section 360bbb-3(b)(1), unless the authorization is terminated or revoked.  Performed at [facility], [address], [place], [place] [address]   Urine culture     Status: None   Collection Time: 06/02/20  7:29 AM   Specimen: Urine, Catheterized  Result Value Ref Range Status   Specimen Description   Final    URINE, CATHETERIZED Performed at [facility], [address] [place], [place] [address]    Special Requests   Final    Normal Performed at [facility], [address], [place], [place] [address]    Culture   Final    NO GROWTH Performed at [facility], [place] [address] [place], [place] [address]    Report Status 06/03/2020 FINAL  Final  Blood culture (routine x 2)     Status: None (Preliminary result)   Collection Time: 06/02/20  7:31 AM   Specimen: BLOOD RIGHT HAND  Result Value Ref Range Status   Specimen Description   Final    BLOOD RIGHT HAND  BOTTLES DRAWN AEROBIC AND ANAEROBIC   Special Requests Blood Culture adequate volume  Final   Culture   Final    NO GROWTH 1 DAY Performed at [facility], [address] [place], [place] [address]    Report Status PENDING  Incomplete  MRSA PCR Screening     Status: None   Collection Time: 06/02/20  4:48 PM   Specimen: Nasal Mucosa; Nasopharyngeal  Result Value Ref Range Status   MRSA by PCR NEGATIVE NEGATIVE Final    Comment:        The GeneXpert MRSA Assay (FDA approved for NASAL specimens only), is one component of a comprehensive MRSA colonization surveillance program. It is not intended to diagnose MRSA infection nor to guide or monitor treatment for MRSA infections. Performed at [facility], [address], [place], [place] [address]          Radiology Studies: [facility] 1 View  Result Date: 06/02/2020 CLINICAL DATA:  Altered mental status, asthma, congestive heart failure EXAM: PORTABLE CHEST 1 VIEW COMPARISON:  05/05/2020 FINDINGS: Lungs are clear. No pneumothorax or pleural effusion. Left subclavian chest port with its tip within the superior vena cava is unchanged. Cardiac size within normal limits. Pulmonary vascularity is normal. IMPRESSION: No active disease. Electronically Signed   By: Fidela Salisbury MD   On: 06/02/2020 06:59        Scheduled Meds: . aspirin EC  81 mg Oral Daily  . Chlorhexidine Gluconate Cloth  6 each Topical Daily  . dutasteride  0.5 mg Oral Daily  . gabapentin  300 mg Oral Daily  . gabapentin  600 mg Oral QHS  . heparin  5,000 Units Subcutaneous  Q8H  . insulin aspart  0-5 Units Subcutaneous QHS  . insulin aspart  0-9 Units Subcutaneous TID WC  . metoprolol succinate  37.5 mg Oral Daily  . [START ON 06/04/2020] pravastatin  40 mg Oral Q M,W,F   Continuous Infusions: . ceFEPime (MAXIPIME) IV Stopped (06/03/20 0527)     LOS: 1 day    Time spent: 35 minutes    Pratik D Manuella Ghazi, DO [facility]  If 7PM-7AM, please  contact night-coverage [email or web address] 06/03/2020, 11:51 AM

## 2020-06-04 ENCOUNTER — Inpatient Hospital Stay (HOSPITAL_COMMUNITY): Payer: Medicare Other

## 2020-06-04 DIAGNOSIS — A419 Sepsis, unspecified organism: Secondary | ICD-10-CM | POA: Diagnosis not present

## 2020-06-04 DIAGNOSIS — R6520 Severe sepsis without septic shock: Secondary | ICD-10-CM | POA: Diagnosis not present

## 2020-06-04 LAB — GLUCOSE, CAPILLARY
Glucose-Capillary: 146 mg/dL — ABNORMAL HIGH (ref 70–99)
Glucose-Capillary: 151 mg/dL — ABNORMAL HIGH (ref 70–99)
Glucose-Capillary: 108 mg/dL — ABNORMAL HIGH (ref 70–99)
Glucose-Capillary: 170 mg/dL — ABNORMAL HIGH (ref 70–99)

## 2020-06-04 LAB — BASIC METABOLIC PANEL
Anion gap: 11 (ref 5–15)
BUN: 15 mg/dL (ref 8–23)
CO2: 25 mmol/L (ref 22–32)
Calcium: 8.5 mg/dL — ABNORMAL LOW (ref 8.9–10.3)
Chloride: 100 mmol/L (ref 98–111)
Creatinine, Ser: 0.8 mg/dL (ref 0.61–1.24)
GFR, Estimated: >60 mL/min (ref >60)
Glucose, Bld: 158 mg/dL — ABNORMAL HIGH (ref 70–99)
Potassium: 3.8 mmol/L (ref 3.5–5.1)
Sodium: 136 mmol/L (ref 135–145)

## 2020-06-04 LAB — BLOOD GAS, ARTERIAL
Acid-Base Excess: 1.1 mmol/L (ref 0.0–2.0)
Bicarbonate: 25.1 mmol/L (ref 20.0–28.0)
FIO2: 60
O2 Saturation: 98.8 %
Patient temperature: 37
pCO2 arterial: 47.5 mmHg (ref 32.0–48.0)
pH, Arterial: 7.357 (ref 7.350–7.450)
pO2, Arterial: 116 mmHg — ABNORMAL HIGH (ref 83.0–108.0)

## 2020-06-04 LAB — CBC
HCT: 30.3 % — ABNORMAL LOW (ref 39.0–52.0)
Hemoglobin: 9.5 g/dL — ABNORMAL LOW (ref 13.0–17.0)
MCH: 31.5 pg (ref 26.0–34.0)
MCHC: 31.4 g/dL (ref 30.0–36.0)
MCV: 100.3 fL — ABNORMAL HIGH (ref 80.0–100.0)
Platelets: 308 10*3/uL (ref 150–400)
RBC: 3.02 MIL/uL — ABNORMAL LOW (ref 4.22–5.81)
RDW: 15.8 % — ABNORMAL HIGH (ref 11.5–15.5)
WBC: 7.3 10*3/uL (ref 4.0–10.5)
nRBC: 0 % (ref 0.0–0.2)

## 2020-06-04 LAB — T3, FREE: T3, Free: 2.1 pg/mL (ref 2.0–4.4)

## 2020-06-04 LAB — SURGICAL PATHOLOGY

## 2020-06-04 LAB — PROCALCITONIN: Procalcitonin: 0.61 ng/mL

## 2020-06-04 MED ORDER — LOSARTAN POTASSIUM 50 MG PO TABS
50.0000 mg | ORAL_TABLET | Freq: Every day | ORAL | Status: DC
  Administered 2020-06-04: 50 mg via ORAL
  Filled 2020-06-04: qty 1

## 2020-06-04 MED ORDER — FUROSEMIDE 20 MG PO TABS: 20.0000 mg | ORAL_TABLET | Freq: Two times a day (BID) | ORAL | Status: DC

## 2020-06-04 MED ORDER — LEVALBUTEROL HCL 0.63 MG/3ML IN NEBU
INHALATION_SOLUTION | RESPIRATORY_TRACT | Status: AC
  Administered 2020-06-04: 0.63 mg
  Filled 2020-06-04: qty 3

## 2020-06-04 MED ORDER — CLONIDINE HCL 0.1 MG PO TABS
0.1000 mg | ORAL_TABLET | Freq: Three times a day (TID) | ORAL | Status: DC
  Administered 2020-06-04 (×3): 0.1 mg via ORAL
  Filled 2020-06-04 (×3): qty 1

## 2020-06-04 MED ORDER — FUROSEMIDE 10 MG/ML IJ SOLN
INTRAMUSCULAR | Status: AC
  Filled 2020-06-04: qty 2

## 2020-06-04 MED ORDER — IPRATROPIUM BROMIDE 0.02 % IN SOLN
RESPIRATORY_TRACT | Status: AC
  Administered 2020-06-04: 0.5 mg
  Filled 2020-06-04: qty 2.5

## 2020-06-04 MED ORDER — FUROSEMIDE 10 MG/ML IJ SOLN
20.0000 mg | Freq: Two times a day (BID) | INTRAMUSCULAR | Status: DC
  Administered 2020-06-04 (×2): 20 mg via INTRAVENOUS
  Filled 2020-06-04 (×2): qty 2

## 2020-06-04 MED ORDER — POLYETHYLENE GLYCOL 3350 17 G PO PACK
17.0000 g | PACK | Freq: Every day | ORAL | Status: DC
  Administered 2020-06-04: 17 g via ORAL
  Filled 2020-06-04: qty 1

## 2020-06-04 MED ORDER — FUROSEMIDE 10 MG/ML IJ SOLN
20.0000 mg | Freq: Once | INTRAMUSCULAR | Status: AC
  Administered 2020-06-04: 20 mg via INTRAVENOUS

## 2020-06-04 NOTE — TOC Initial Note (Addendum)
Transition of Care [facility]) - Initial/Assessment Note   Patient Details  Name: John [name] MRN: [id number] Date of Birth: 03/09/1946  Transition of Care [facility]) CM/SW Contact:    Sherie Don, LCSW Phone Number: 06/04/2020, 2:09 PM  Clinical Narrative: Patient is a 75 year old male who was admitted for severe sepsis. Patient has a history of hypertension,  COPD, BPH with urinary obstruction, non-small cell lung cancer, CHF, and DM. Readmission checklist completed due to high readmission score.   CSW spoke with patient to complete assessment. Per patient, he resides at home with his significant other. Patient is independent with ADLs at baseline. Patient reported he is able to afford his medications monthly and takes these as prescribed. Patient transports himself to appointments. Current DME includes O2 (from [employer]), CPAP, walker, and cane. Patient confirmed he is active with [facility] for [facility]. TOC to follow for discharge needs.  Expected Discharge Plan: [place] Barriers to Discharge: Continued Medical Work up  Patient Goals and CMS Choice Patient states their goals for this hospitalization and ongoing recovery are:: Return home CMS Medicare.gov Compare Post Acute Care list provided to:: Patient Choice offered to / list presented to : Patient  Expected Discharge Plan and Services Expected Discharge Plan: [place] In-house Referral: Clinical Social Work Post Acute Care Choice: [place] arrangements for the past 2 months: [place]  Prior Living Arrangements/Services Living arrangements for the past 2 months: Single Family Home Lives with:: Significant Other Patient language and need for interpreter reviewed:: Yes Do you feel safe going back to the place where you live?: Yes      Need for Family Participation in Patient Care: No (Comment) Care giver support system in place?: Yes (comment) Current home services: DME,Home RN (O2 (from  [employer]), CPAP, walker, cane) Criminal Activity/Legal Involvement Pertinent to Current Situation/Hospitalization: No - Comment as needed  Activities of Daily Living Home Assistive Devices/Equipment: Cane (specify quad or straight),CBG Meter,Dentures (specify type),Eyeglasses,Oxygen ADL Screening (condition at time of admission) Patient's cognitive ability adequate to safely complete daily activities?: Yes Is the patient deaf or have difficulty hearing?: Yes Does the patient have difficulty seeing, even when wearing glasses/contacts?: No Does the patient have difficulty concentrating, remembering, or making decisions?: No Patient able to express need for assistance with ADLs?: Yes Does the patient have difficulty dressing or bathing?: No Independently performs ADLs?: Yes (appropriate for developmental age) Does the patient have difficulty walking or climbing stairs?: No Weakness of Legs: None Weakness of Arms/Hands: None  Emotional Assessment Attitude/Demeanor/Rapport: Engaged Affect (typically observed): Accepting Orientation: : Oriented to Self,Oriented to Place,Oriented to  Time,Oriented to Situation Alcohol / Substance Use: Tobacco Use Psych Involvement: No (comment)  Admission diagnosis:  Lower urinary tract infectious disease [N39.0] Severe sepsis ([place]) [A41.9, R65.20] Patient Active Problem List   Diagnosis Date Noted  . Sepsis due to Escherichia coli (E. coli) ([place]) 05/07/2020  . Hypokalemia 05/06/2020  . Hyperglycemia due to diabetes mellitus ([place]) 05/06/2020  . Hypoalbuminemia 05/06/2020  . Prolonged QT interval 05/06/2020  . Lactic acidosis 05/06/2020  . Bronchitis due to tobacco use 05/06/2020  . Acute respiratory failure with hypoxia ([place]) 05/06/2020  . Severe sepsis ([place]) 05/06/2020  . Acute on chronic respiratory failure with hypoxia ([place]) 05/06/2020  . COPD with acute exacerbation ([place]) 05/06/2020  . Tobacco abuse 05/06/2020  . Urinary retention 03/09/2020  .  Acute cystitis with hematuria   . Complicated UTI (urinary tract infection)   .  Dyspnea   . Hypoxia   . Sepsis secondary to UTI ([place]) 03/03/2020  . COPD GOLD III/ group D still smoking  10/24/2019  . PAD (peripheral artery disease) ([place]) 09/13/2019  . Obstructive sleep apnea treated with continuous positive airway pressure (CPAP) 05/31/2018  . Diabetic neuropathy ([place]) 12/29/2017  . Cigarette smoker 12/29/2017  . Hyperlipidemia associated with type 2 diabetes mellitus ([place]) 12/29/2017  . Primary insomnia 12/29/2017  . Primary osteoarthritis involving multiple joints 12/29/2017  . OSA (obstructive sleep apnea) 12/10/2017  . Hypothyroidism 06/09/2017  . Joint pain 08/05/2016  . Bilateral hand swelling 08/05/2016  . Hyponatremia 08/05/2016  . Pulmonary nodule, left 07/16/2016  . CHF (congestive heart failure) ([place]) 07/09/2016  . DM (diabetes mellitus) ([place]) 07/09/2016  . Non-small cell lung cancer, right ([place]) 04/18/2016  . Essential hypertension 02/01/2016  . COPD (chronic obstructive pulmonary disease) ([place]) 02/01/2016  . BPH with urinary obstruction 02/01/2016   PCP:  [name], FNP Pharmacy:   [id number] [place], [place] [address] Phone: [phone] Fax: [phone]  Readmission Risk Interventions Readmission Risk Prevention Plan 06/04/2020 05/08/2020  Transportation Screening Complete Complete  Home Care Screening - Complete  Medication Review (RN CM) - Complete  HRI or Home Care Consult Complete -  Social Work Consult for Recovery Care Planning/Counseling Complete -  Palliative Care Screening Not Applicable -  Medication Review Press photographer) Complete -  Some recent data might be hidden

## 2020-06-04 NOTE — Progress Notes (Signed)
Patient placed on BIPAP auto about 22:20 with 4 liters titrated into circuit. Pressure at 8. Nurse notified RT at 01:00 that patients saturation had decreased to 89. Hr to 125. Patients oxygen increased to 8 liters titrated in to circuit. Saturation now 99. Patient appears to have some orthostatic changes  when he reclines , saturation drops , hr increases, nausea, chest tightness, lung sounds appear with crackles. Nurse aware also.

## 2020-06-04 NOTE — Progress Notes (Signed)
Notified physician and RT for episode of tachycardia and hypertension with decreased SPO2 into the 70s.  ABG, breathing treatment, and xray ordered. Patient complains of stomach discomfort, restless and talks in circles with repeating words and see spots with episodes. The word repetition with SPO2 decrease proceeds into  difficulty in breathing.Skin coloration did not turn to cyanotic as last night.  Lung sounds are wheezing and "wet" as relayed to the physician. Orders in process.

## 2020-06-04 NOTE — Progress Notes (Signed)
1 Hour Post Lasix = 550 cc of urine Patient is now resting comfortably on CPAP

## 2020-06-04 NOTE — Progress Notes (Signed)
Patient is starting to show improvement in mentation and vital signs. Currently still on NRBM. Physician was notified of ordered test results availablity.

## 2020-06-04 NOTE — Progress Notes (Signed)
PROGRESS NOTE    John [name]  FKC:[id number] DOB: [birth year] DOA: 06/02/2020 PCP: [name], FNP   Brief Narrative:  Per HPI: John [name] a 75 y.o.malewith medical history significant forCOPD with chronic hypoxemia on 3-4 L nasal cannula, type 2 diabetes, hypertension, lung cancer, CHF, hypothyroidism, dyslipidemia, and BPH with urinary retention with recent TURP on 2/24 who was just discharged yesterday with indwelling Foley catheter. He was noted to have altered mentation by his significant other earlier this morning at approximately 1 AM as well as some shaking rigors and chills. Therefore, EMS was called and patient was brought to the ED for further evaluation.  -Patient was admitted with severe sepsis secondary to recent TURP on 2/24.  He was initially started on IV Zosyn and switched over to cefepime based on recent urine culture and sensitivity results.   Assessment & Plan:   Active Problems:   Severe sepsis (HCC)   Severe sepsis likely secondary to UTI/prostatitis -In setting of recent TURP procedure 2/24 -Changed to cefepime on 2/26 per urology recommendations based on last urine culture -Urine cultures with no growth and blood cultures with no growth thus far -Appreciate urology input with plans to discharge on Bactrim for 7 days -DC IV fluid -Monitor labs  Acute metabolic encephalopathy secondary to above -Resolved  Acute hypoxemic respiratory failure -Due to some volume overload -IV Lasix 20 mg twice daily ordered -Plan to resume home Lasix on discharge  AKI secondary to above-resolved -Now requiring diuresis for acute hypoxemic respiratory failure -Monitor while on Lasix  BPH with urinary retention -Status post recent TURP in 2/24 -Has Foley catheter  Mild hypokalemia -Resolved, continue to monitor while on diuresis  Sinus tachycardia -Likely secondary to some idiopathic hypothyroidism -Levothyroxine to be held for  now  History of type 2 diabetes -Currently with stable blood glucose readings, maintain on SSI  Hypertension -Ok to resume metoprolol as well as clonidine, losartan, Lasix  History of hypothyroidism -Synthroid currently held with TSH 0.11 -Free T4 1.96 and T3 2.1 -Plan to hold thyroid medication for now given sinus tachycardia  History of dyslipidemia -Continue statin   DVT prophylaxis:Heparin Code Status: Full Family Communication: discussed with spouse/caretaker Judy on 2/28 Disposition Plan:  Status is: Inpatient  Remains inpatient appropriate because:IV treatments appropriate due to intensity of illness or inability to take PO and Inpatient level of care appropriate due to severity of illness   Dispo: The patient is from: Home  Anticipated d/c is to: Home  Patient currently is not medically stable to d/c.              Difficult to place patient No  Consultants:   Urology  Procedures:   See below  Antimicrobials:  Anti-infectives (From admission, onward)   Start     Dose/Rate Route Frequency Ordered Stop   06/02/20 2100  ceFEPIme (MAXIPIME) 2 g in sodium chloride 0.9 % 100 mL IVPB        2 g 200 mL/hr over 30 Minutes Intravenous Every 8 hours 06/02/20 1911     06/02/20 1300  piperacillin-tazobactam (ZOSYN) IVPB 3.375 g  Status:  Discontinued        3.375 g 12.5 mL/hr over 240 Minutes Intravenous Every 8 hours 06/02/20 1154 06/02/20 1808   06/02/20 0745  piperacillin-tazobactam (ZOSYN) IVPB 3.375 g        3.375 g 100 mL/hr over 30 Minutes Intravenous  Once 06/02/20 0739 06/02/20 7494       Subjective: Patient seen and  evaluated today with no new acute complaints or concerns.  Noted to have some worsening shortness of breath and hypoxemia overnight started on Lasix.  Objective: Vitals:   06/04/20 1103 06/04/20 1200 06/04/20 1230 06/04/20 1238  BP:  (!) 112/50    Pulse:  (!) 102    Resp:  19    Temp: 98.4 F (36.9 C)      TempSrc: Oral     SpO2:  96% (!) 83% 91%  Weight:      Height:        Intake/Output Summary (Last 24 hours) at 06/04/2020 1304 Last data filed at 06/04/2020 0900 Gross per 24 hour  Intake 1264.72 ml  Output 2050 ml  Net -785.28 ml   Filed Weights   06/02/20 1655  Weight: 78.6 kg    Examination:  General exam: Appears calm and comfortable  Respiratory system: Clear to auscultation. Respiratory effort normal. On 6L [place]. Cardiovascular system: S1 & S2 heard, RRR. Tachycardic. Gastrointestinal system: Abdomen is soft Central nervous system: Alert and awake Extremities: No edema Skin: No significant lesions noted Psychiatry: Flat affect. Foley with clear, yellow UO.     Data Reviewed: I have personally reviewed following labs and imaging studies  CBC: Recent Labs  Lab 06/01/20 0537 06/02/20 0645 06/02/20 1316 06/03/20 0447 06/04/20 0458  WBC 6.5 8.4 10.0 6.9 7.3  NEUTROABS  --  7.5  --   --   --   HGB 11.0* 11.0* 10.0* 11.2* 9.5*  HCT 34.7* 35.1* 32.2* 36.5* 30.3*  MCV 98.9 99.4 100.6* 102.0* 100.3*  PLT 365 373 324 308 314   Basic Metabolic Panel: Recent Labs  Lab 05/31/20 1611 06/01/20 0537 06/02/20 0757 06/02/20 1316 06/03/20 0447 06/04/20 0458  NA 139 138 137  --  140 136  K 4.1 3.9 3.4*  --  4.4 3.8  CL 103 103 100  --  104 100  CO2 26 27 27   --  28 25  GLUCOSE 100* 110* 163*  --  111* 158*  BUN 16 12 17   --  16 15  CREATININE 0.71 0.74 1.03 0.96 0.83 0.80  CALCIUM 8.8* 8.8* 8.8*  --  9.0 8.5*  MG  --   --   --   --  2.0  --    GFR: Estimated Creatinine Clearance: 75.7 mL/min (by C-G formula based on SCr of 0.8 mg/dL). Liver Function Tests: Recent Labs  Lab 06/02/20 0757  AST 16  ALT 13  ALKPHOS 79  BILITOT 0.4  PROT 6.3*  ALBUMIN 3.2*   No results for input(s): LIPASE, AMYLASE in the last 168 hours. No results for input(s): AMMONIA in the last 168 hours. Coagulation Profile: No results for input(s): INR, PROTIME in the last 168  hours. Cardiac Enzymes: No results for input(s): CKTOTAL, CKMB, CKMBINDEX, TROPONINI in the last 168 hours. BNP (last 3 results) No results for input(s): PROBNP in the last 8760 hours. HbA1C: No results for input(s): HGBA1C in the last 72 hours. CBG: Recent Labs  Lab 06/03/20 1117 06/03/20 1555 06/03/20 2139 06/04/20 0748 06/04/20 1105  GLUCAP 129* 115* 134* 146* 151*   Lipid Profile: No results for input(s): CHOL, HDL, LDLCALC, TRIG, CHOLHDL, LDLDIRECT in the last 72 hours. Thyroid Function Tests: Recent Labs    06/02/20 1316 06/03/20 0827  TSH 0.117*  --   FREET4  --  1.96*  T3FREE  --  2.1   Anemia Panel: No results for input(s): VITAMINB12, FOLATE, FERRITIN, TIBC, IRON, RETICCTPCT in  the last 72 hours. Sepsis Labs: Recent Labs  Lab 06/02/20 0645 06/02/20 0731 06/02/20 1316 06/03/20 0447 06/04/20 0458  PROCALCITON  --   --  0.78 0.86 0.61  LATICACIDVEN 1.8 1.9  --   --   --     Recent Results (from the past 240 hour(s))  SARS CORONAVIRUS 2 (TAT 6-24 HRS) Nasopharyngeal Nasopharyngeal Swab     Status: None   Collection Time: 05/29/20 11:17 AM   Specimen: Nasopharyngeal Swab  Result Value Ref Range Status   SARS Coronavirus 2 NEGATIVE NEGATIVE Final    Comment: (NOTE) SARS-CoV-2 target nucleic acids are NOT DETECTED.  The SARS-CoV-2 RNA is generally detectable in upper and lower respiratory specimens during the acute phase of infection. Negative results do not preclude SARS-CoV-2 infection, do not rule out co-infections with other pathogens, and should not be used as the sole basis for treatment or other patient management decisions. Negative results must be combined with clinical observations, patient history, and epidemiological information. The expected result is Negative.  Fact Sheet for Patients: SugarRoll.be  Fact Sheet for Healthcare Providers: https://www.woods-mathews.com/  This test is not yet  approved or cleared by the [place] FDA and  has been authorized for detection and/or diagnosis of SARS-CoV-2 by FDA under an Emergency Use Authorization (EUA). This EUA will remain  in effect (meaning this test can be used) for the duration of the COVID-19 declaration under Se ction 564(b)(1) of the Act, 21 U.S.C. section 360bbb-3(b)(1), unless the authorization is terminated or revoked sooner.  Performed at [facility], [place] [address] [place], [place] [address]   Blood culture (routine x 2)     Status: None (Preliminary result)   Collection Time: 06/02/20  6:45 AM   Specimen: BLOOD RIGHT FOREARM  Result Value Ref Range Status   Specimen Description   Final    BLOOD RIGHT FOREARM BOTTLES DRAWN AEROBIC AND ANAEROBIC   Special Requests   Final    Blood Culture results may not be optimal due to an inadequate volume of blood received in culture bottles   Culture   Final    NO GROWTH 2 DAYS Performed at [facility], [address] [place], [place] [address]    Report Status PENDING  Incomplete  Resp Panel by RT-PCR (Flu A&B, Covid) Urine, Catheterized     Status: None   Collection Time: 06/02/20  7:08 AM   Specimen: Urine, Catheterized; Nasopharyngeal(NP) swabs in vial transport medium  Result Value Ref Range Status   SARS Coronavirus 2 by RT PCR NEGATIVE NEGATIVE Final    Comment: (NOTE) SARS-CoV-2 target nucleic acids are NOT DETECTED.  The SARS-CoV-2 RNA is generally detectable in upper respiratory specimens during the acute phase of infection. The lowest concentration of SARS-CoV-2 viral copies this assay can detect is 138 copies/mL. A negative result does not preclude SARS-Cov-2 infection and should not be used as the sole basis for treatment or other patient management decisions. A negative result may occur with  improper specimen collection/handling, submission of specimen other than nasopharyngeal swab, presence of viral mutation(s) within the areas targeted  by this assay, and inadequate number of viral copies(<138 copies/mL). A negative result must be combined with clinical observations, patient history, and epidemiological information. The expected result is Negative.  Fact Sheet for Patients:  EntrepreneurPulse.com.au  Fact Sheet for Healthcare Providers:  IncredibleEmployment.be  This test is no t yet approved or cleared by the [place] FDA and  has been authorized for detection  and/or diagnosis of SARS-CoV-2 by FDA under an Emergency Use Authorization (EUA). This EUA will remain  in effect (meaning this test can be used) for the duration of the COVID-19 declaration under Section 564(b)(1) of the Act, 21 U.S.C.section 360bbb-3(b)(1), unless the authorization is terminated  or revoked sooner.       Influenza A by PCR NEGATIVE NEGATIVE Final   Influenza B by PCR NEGATIVE NEGATIVE Final    Comment: (NOTE) The Xpert Xpress SARS-CoV-2/FLU/RSV plus assay is intended as an aid in the diagnosis of influenza from Nasopharyngeal swab specimens and should not be used as a sole basis for treatment. Nasal washings and aspirates are unacceptable for Xpert Xpress SARS-CoV-2/FLU/RSV testing.  Fact Sheet for Patients: EntrepreneurPulse.com.au  Fact Sheet for Healthcare Providers: IncredibleEmployment.be  This test is not yet approved or cleared by the [place] FDA and has been authorized for detection and/or diagnosis of SARS-CoV-2 by FDA under an Emergency Use Authorization (EUA). This EUA will remain in effect (meaning this test can be used) for the duration of the COVID-19 declaration under Section 564(b)(1) of the Act, 21 U.S.C. section 360bbb-3(b)(1), unless the authorization is terminated or revoked.  Performed at [facility], [address] [place], [place] [address]   Urine culture     Status: None   Collection Time: 06/02/20  7:29 AM   Specimen:  Urine, Catheterized  Result Value Ref Range Status   Specimen Description   Final    URINE, CATHETERIZED Performed at [facility], [address] [place], [place] [address]    Special Requests   Final    Normal Performed at [facility], [address], [place], [place] [address]    Culture   Final    NO GROWTH Performed at [facility], [place] [address] [place], [place] [address]    Report Status 06/03/2020 FINAL  Final  Blood culture (routine x 2)     Status: None (Preliminary result)   Collection Time: 06/02/20  7:31 AM   Specimen: BLOOD RIGHT HAND  Result Value Ref Range Status   Specimen Description   Final    BLOOD RIGHT HAND BOTTLES DRAWN AEROBIC AND ANAEROBIC   Special Requests Blood Culture adequate volume  Final   Culture   Final    NO GROWTH 2 DAYS Performed at [facility], [address], [place], [place] [address]    Report Status PENDING  Incomplete  MRSA PCR Screening     Status: None   Collection Time: 06/02/20  4:48 PM   Specimen: Nasal Mucosa; Nasopharyngeal  Result Value Ref Range Status   MRSA by PCR NEGATIVE NEGATIVE Final    Comment:        The GeneXpert MRSA Assay (FDA approved for NASAL specimens only), is one component of a comprehensive MRSA colonization surveillance program. It is not intended to diagnose MRSA infection nor to guide or monitor treatment for MRSA infections. Performed at [facility], [address], [place], [place] [address]          Radiology Studies: [facility] 1 View  Result Date: 06/04/2020 CLINICAL DATA:  Shortness of breath EXAM: PORTABLE CHEST 1 VIEW COMPARISON:  06/02/2020 FINDINGS: The heart size is stable. Aortic calcifications are noted. There is a well-positioned left-sided Port-A-Cath place. The lung markings are similar to prior study with no new focal infiltrate or pneumothorax. There is no large pleural effusion. There are stable post treatment changes in the right hilum. Emphysematous changes are  noted  again noted. There are scattered pulmonary nodules which are similar to prior chest x-ray. IMPRESSION: No active disease. Electronically Signed   By: Constance Holster M.D.   On: 06/04/2020 02:18        Scheduled Meds: . aspirin EC  81 mg Oral Daily  . Chlorhexidine Gluconate Cloth  6 each Topical Daily  . cloNIDine  0.1 mg Oral TID  . dutasteride  0.5 mg Oral Daily  . furosemide      . furosemide  20 mg Intravenous Q12H  . gabapentin  300 mg Oral Daily  . gabapentin  600 mg Oral QHS  . heparin  5,000 Units Subcutaneous Q8H  . insulin aspart  0-5 Units Subcutaneous QHS  . insulin aspart  0-9 Units Subcutaneous TID WC  . losartan  50 mg Oral Daily  . metoprolol succinate  37.5 mg Oral Daily  . polyethylene glycol  17 g Oral Daily  . pravastatin  40 mg Oral Q M,W,F   Continuous Infusions: . ceFEPime (MAXIPIME) IV 2 g (06/04/20 1257)     LOS: 2 days    Time spent: 35 minutes    Estefan Pattison D Manuella Ghazi, DO [facility]  If 7PM-7AM, please contact night-coverage [email or web address] 06/04/2020, 1:04 PM

## 2020-06-04 NOTE — Progress Notes (Signed)
1:16 AM RN called due to patient being in some respiratory distress while on NRBM, he was tachycardic and tachypneic.  Patient was reported to present with similar condition yesterday. Chest x-ray was done and showed no acute findings.  ABG was done and showed 7.357/47. 5/116/25.1. Patient states that he takes Lasix at home and has not had 1 since admission.  IV Lasix 20 Mg x1 was given with subsequent improvement in mentation and vital signs.  We shall continue to monitor patient at this time.

## 2020-06-05 ENCOUNTER — Telehealth: Payer: Self-pay | Admitting: *Deleted

## 2020-06-05 DIAGNOSIS — R6520 Severe sepsis without septic shock: Secondary | ICD-10-CM | POA: Diagnosis not present

## 2020-06-05 DIAGNOSIS — A419 Sepsis, unspecified organism: Secondary | ICD-10-CM | POA: Diagnosis not present

## 2020-06-05 LAB — BASIC METABOLIC PANEL
Anion gap: 10 (ref 5–15)
BUN: 23 mg/dL (ref 8–23)
CO2: 28 mmol/L (ref 22–32)
Calcium: 8.7 mg/dL — ABNORMAL LOW (ref 8.9–10.3)
Chloride: 103 mmol/L (ref 98–111)
Creatinine, Ser: 0.81 mg/dL (ref 0.61–1.24)
GFR, Estimated: >60 mL/min (ref >60)
Glucose, Bld: 100 mg/dL — ABNORMAL HIGH (ref 70–99)
Potassium: 3.7 mmol/L (ref 3.5–5.1)
Sodium: 141 mmol/L (ref 135–145)

## 2020-06-05 LAB — CBC
HCT: 30.3 % — ABNORMAL LOW (ref 39.0–52.0)
Hemoglobin: 9.3 g/dL — ABNORMAL LOW (ref 13.0–17.0)
MCH: 31.1 pg (ref 26.0–34.0)
MCHC: 30.7 g/dL (ref 30.0–36.0)
MCV: 101.3 fL — ABNORMAL HIGH (ref 80.0–100.0)
Platelets: 269 10*3/uL (ref 150–400)
RBC: 2.99 MIL/uL — ABNORMAL LOW (ref 4.22–5.81)
RDW: 15.7 % — ABNORMAL HIGH (ref 11.5–15.5)
WBC: 5.4 10*3/uL (ref 4.0–10.5)
nRBC: 0 % (ref 0.0–0.2)

## 2020-06-05 LAB — MAGNESIUM: Magnesium: 2.1 mg/dL (ref 1.7–2.4)

## 2020-06-05 MED ORDER — POLYETHYLENE GLYCOL 3350 17 G PO PACK: 17.0000 g | PACK | Freq: Every day | ORAL | 0 refills | Status: DC | PRN

## 2020-06-05 MED ORDER — SULFAMETHOXAZOLE-TRIMETHOPRIM 800-160 MG PO TABS: 1.0000 | ORAL_TABLET | Freq: Two times a day (BID) | ORAL | 0 refills | Status: DC

## 2020-06-05 MED ORDER — LEVOTHYROXINE SODIUM 50 MCG PO TABS: 50.0000 ug | ORAL_TABLET | Freq: Every day | ORAL | 2 refills | Status: DC

## 2020-06-05 NOTE — Discharge Summary (Signed)
Physician Discharge Summary  John [name] TZG:[id number] DOB: 1946/02/14 DOA: 06/02/2020  PCP: [name], FNP  Admit date: 06/02/2020  Discharge date: 06/05/2020  Admitted From:Home  Disposition:  Home  Recommendations for Outpatient Follow-up:  1. Follow up with PCP in 1-2 weeks 2. Follow-up with Dr. Alyson Ingles with urology as scheduled for voiding trial on 3/4 3. Continue on Bactrim for 7 days as prescribed 4. Continue lower dose of Synthroid at 50 mcg and follow-up further thyroid panel with PCP  Home Health: None  Equipment/Devices: Home oxygen and CPAP at night  Discharge Condition:Stable  CODE STATUS: Full  Diet recommendation: Heart Healthy/Carb Modified  Brief/Interim Summary: Maijor Hornig a 75 y.o.malewith medical history significant forCOPD with chronic hypoxemia on 3-4 L nasal cannula, type 2 diabetes, hypertension, lung cancer, CHF, hypothyroidism, dyslipidemia, and BPH with urinary retention with recent TURP on 2/24 who was just discharged yesterday with indwelling Foley catheter. He was noted to have altered mentation by his significant other earlier this morning at approximately 1 AM as well as some shaking rigors and chills. Therefore, EMS was called and patient was brought to the ED for further evaluation.  -Patient was admitted with severe sepsis secondary to recent TURP on 2/24. He was initially started on IV Zosyn and switched over to cefepime based on recent urine culture showing Pseudomonas with intermediate sensitivity to Zosyn.  He was kept off of his home Lasix due to his initial hypotension and then became slightly volume overloaded with some mild hypoxemia which prolonged his hospital stay.  He was adequately diuresed and his oxygen was weaned to the point where he may need one or 2 L during the daytime.  He will continue his usual home Lasix and should not need daytime oxygen for much longer.  He will continue on Bactrim as prescribed and  recommended by urology for seven more days and follow-up with voiding trial on 3/4 as scheduled.  He was noted to have some tachycardia during his stay, but was asymptomatic.  This appears to have been related to his thyroid medication which is currently being overdose based on free T4 values.  This has been reduced to 50 mcg daily from 75 mcg previously.  No other acute events noted throughout this inpatient stay.  Discharge Diagnoses:  Active Problems:   Severe sepsis [name])  Principal discharge diagnosis: Severe sepsis secondary to UTI in the setting of recent TURP 2/24.  Discharge Instructions  Discharge Instructions    Diet - low sodium heart healthy   Complete by: As directed    Increase activity slowly   Complete by: As directed      Allergies as of 06/05/2020      Reactions   Jardiance [empagliflozin] Other (See Comments)   FEELS SLUGGISH, TIRED   Lopressor [metoprolol] Other (See Comments)   Fatigue      Medication List    STOP taking these medications   nitrofurantoin 50 MG capsule Commonly known as: MACRODANTIN     TAKE these medications   acetaminophen 500 MG tablet Commonly known as: TYLENOL Take 1,000 mg by mouth at bedtime as needed for moderate pain.   albuterol 108 (90 Base) MCG/ACT inhaler Commonly known as: VENTOLIN HFA Inhale 2 puffs into the lungs every 4 (four) hours as needed for wheezing or shortness of breath.   aspirin EC 81 MG tablet Take 81 mg by mouth daily.   bacitracin 500 UNIT/GM ointment Apply 1 application topically 2 (two) times daily.   cloNIDine  0.1 MG tablet Commonly known as: CATAPRES TAKE 1 TABLET (0.1 MG TOTAL) BY MOUTH 3 (THREE) TIMES DAILY.   dutasteride 0.5 MG capsule Commonly known as: AVODART TAKE 1 CAPSULE BY MOUTH EVERY DAY What changed: how much to take   fluticasone 50 MCG/ACT nasal spray Commonly known as: FLONASE Place 1 spray into both nostrils daily as needed for allergies or rhinitis.   furosemide 20 MG  tablet Commonly known as: LASIX Take 1 tablet (20 mg total) by mouth 2 (two) times daily.   gabapentin 300 MG capsule Commonly known as: NEURONTIN Take 1 capsule (300 mg total) by mouth 3 (three) times daily. What changed:   how much to take  when to take this  additional instructions   HYDROcodone-acetaminophen 5-325 MG tablet Commonly known as: Norco Take 1 tablet by mouth every 6 (six) hours as needed for moderate pain.   levothyroxine 50 MCG tablet Commonly known as: SYNTHROID Take 1 tablet (50 mcg total) by mouth daily. What changed:   medication strength  how much to take   linaclotide 72 MCG capsule Commonly known as: Linzess Take 1 capsule (72 mcg total) by mouth daily before breakfast. What changed:   when to take this  reasons to take this   losartan 50 MG tablet Commonly known as: COZAAR Take 1 tablet (50 mg total) by mouth daily.   meloxicam 7.5 MG tablet Commonly known as: MOBIC Take 1 tablet (7.5 mg total) by mouth daily as needed for pain (back pain).   metFORMIN 500 MG 24 hr tablet Commonly known as: GLUCOPHAGE-XR Take 1 tablet (500 mg total) by mouth daily with breakfast.   metoprolol succinate 25 MG 24 hr tablet Commonly known as: TOPROL-XL Take 1.5 tablets (37.5 mg total) by mouth daily. Take with or immediately following a meal.   ondansetron 4 MG disintegrating tablet Commonly known as: Zofran ODT Take 1 tablet (4 mg total) by mouth every 8 (eight) hours as needed for nausea or vomiting.   OneTouch Delica Lancets 27O Misc Use to test blood sugars daily as directed. DX: E11.9   OneTouch Verio Reflect w/Device Kit Use to test blood sugars daily as directed. DX: E11.9   OneTouch Verio test strip Generic drug: glucose blood Use to test blood sugars daily as directed. DX: E11.9   polyethylene glycol 17 g packet Commonly known as: MIRALAX / GLYCOLAX Take 17 g by mouth daily as needed for moderate constipation or severe  constipation.   pravastatin 40 MG tablet Commonly known as: PRAVACHOL TAKE 1 TABLET (40 MG TOTAL) BY MOUTH ON MONDAYS , WEDNESDAYS, AND FRIDAYS AS DIRECTED What changed: See the new instructions.   Rybelsus 7 MG Tabs Generic drug: Semaglutide Take 7 mg by mouth daily.   sulfamethoxazole-trimethoprim 800-160 MG tablet Commonly known as: BACTRIM DS Take 1 tablet by mouth 2 (two) times daily for 7 days.   Trelegy Ellipta 100-62.5-25 MCG/INH Aepb Generic drug: Fluticasone-Umeclidin-Vilant Inhale 1 puff into the lungs daily.   zolpidem 5 MG tablet Commonly known as: AMBIEN Take 5 mg by mouth at bedtime as needed for sleep.       Follow-up Information    [name], FNP Follow up in 1 week(s).   Specialty: Family Medicine Contact information: [place] [address] [phone]              Allergies  Allergen Reactions  . Jardiance [Empagliflozin] Other (See Comments)    FEELS SLUGGISH, TIRED  . Lopressor [Metoprolol]  Other (See Comments)    Fatigue    Consultations:  Urology   Procedures/Studies: DG Chest Port 1 View  Result Date: 06/04/2020 CLINICAL DATA:  Shortness of breath EXAM: PORTABLE CHEST 1 VIEW COMPARISON:  06/02/2020 FINDINGS: The heart size is stable. Aortic calcifications are noted. There is a well-positioned left-sided Port-A-Cath place. The lung markings are similar to prior study with no new focal infiltrate or pneumothorax. There is no large pleural effusion. There are stable post treatment changes in the right hilum. Emphysematous changes are noted again noted. There are scattered pulmonary nodules which are similar to prior chest x-ray. IMPRESSION: No active disease. Electronically Signed   By: Constance Holster M.D.   On: 06/04/2020 02:18   DG Chest Port 1 View  Result Date: 06/02/2020 CLINICAL DATA:  Altered mental status, asthma, congestive heart failure EXAM: PORTABLE CHEST 1 VIEW COMPARISON:  05/05/2020 FINDINGS:  Lungs are clear. No pneumothorax or pleural effusion. Left subclavian chest port with its tip within the superior vena cava is unchanged. Cardiac size within normal limits. Pulmonary vascularity is normal. IMPRESSION: No active disease. Electronically Signed   By: Fidela Salisbury MD   On: 06/02/2020 06:59      Discharge Exam: Vitals:   06/05/20 0300 06/05/20 0426  BP: (!) 123/55   Pulse: 86   Resp: 15   Temp:  (!) 96 F (35.6 C)  SpO2: 97%    Vitals:   06/04/20 2304 06/05/20 0100 06/05/20 0300 06/05/20 0426  BP: (!) 122/48 (!) 121/53 (!) 123/55   Pulse:  85 86   Resp:  15 15   Temp: 97.9 F (36.6 C)   (!) 96 F (35.6 C)  TempSrc: Oral   Axillary  SpO2:  100% 97%   Weight:    75.4 kg  Height:        General: Pt is alert, awake, not in acute distress Cardiovascular: RRR, S1/S2 +, no rubs, no gallops Respiratory: CTA bilaterally, no wheezing, no rhonchi, currently on 1 L nasal cannula Abdominal: Soft, NT, ND, bowel sounds + Extremities: no edema, no cyanosis Foley with clear, yellow urine output    The results of significant diagnostics from this hospitalization (including imaging, microbiology, ancillary and laboratory) are listed below for reference.     Microbiology: Recent Results (from the past 240 hour(s))  SARS CORONAVIRUS 2 (TAT 6-24 HRS) Nasopharyngeal Nasopharyngeal Swab     Status: None   Collection Time: 05/29/20 11:17 AM   Specimen: Nasopharyngeal Swab  Result Value Ref Range Status   SARS Coronavirus 2 NEGATIVE NEGATIVE Final    Comment: (NOTE) SARS-CoV-2 target nucleic acids are NOT DETECTED.  The SARS-CoV-2 RNA is generally detectable in upper and lower respiratory specimens during the acute phase of infection. Negative results do not preclude SARS-CoV-2 infection, do not rule out co-infections with other pathogens, and should not be used as the sole basis for treatment or other patient management decisions. Negative results must be combined with  clinical observations, patient history, and epidemiological information. The expected result is Negative.  Fact Sheet for Patients: SugarRoll.be  Fact Sheet for Healthcare Providers: https://www.woods-mathews.com/  This test is not yet approved or cleared by the [place] FDA and  has been authorized for detection and/or diagnosis of SARS-CoV-2 by FDA under an Emergency Use Authorization (EUA). This EUA will remain  in effect (meaning this test can be used) for the duration of the COVID-19 declaration under Se ction 564(b)(1) of the Act, 21 U.S.C. section 360bbb-3(b)(1), unless  the authorization is terminated or revoked sooner.  Performed at [facility], [place] [address] [place], [place] [address]   Blood culture (routine x 2)     Status: None (Preliminary result)   Collection Time: 06/02/20  6:45 AM   Specimen: BLOOD RIGHT FOREARM  Result Value Ref Range Status   Specimen Description   Final    BLOOD RIGHT FOREARM BOTTLES DRAWN AEROBIC AND ANAEROBIC   Special Requests   Final    Blood Culture results may not be optimal due to an inadequate volume of blood received in culture bottles   Culture   Final    NO GROWTH 3 DAYS Performed at [facility], [address], [place], [place] [address]    Report Status PENDING  Incomplete  Resp Panel by RT-PCR (Flu A&B, Covid) Urine, Catheterized     Status: None   Collection Time: 06/02/20  7:08 AM   Specimen: Urine, Catheterized; Nasopharyngeal(NP) swabs in vial transport medium  Result Value Ref Range Status   SARS Coronavirus 2 by RT PCR NEGATIVE NEGATIVE Final    Comment: (NOTE) SARS-CoV-2 target nucleic acids are NOT DETECTED.  The SARS-CoV-2 RNA is generally detectable in upper respiratory specimens during the acute phase of infection. The lowest concentration of SARS-CoV-2 viral copies this assay can detect is 138 copies/mL. A negative result does not preclude  SARS-Cov-2 infection and should not be used as the sole basis for treatment or other patient management decisions. A negative result may occur with  improper specimen collection/handling, submission of specimen other than nasopharyngeal swab, presence of viral mutation(s) within the areas targeted by this assay, and inadequate number of viral copies(<138 copies/mL). A negative result must be combined with clinical observations, patient history, and epidemiological information. The expected result is Negative.  Fact Sheet for Patients:  EntrepreneurPulse.com.au  Fact Sheet for Healthcare Providers:  IncredibleEmployment.be  This test is no t yet approved or cleared by the [place] FDA and  has been authorized for detection and/or diagnosis of SARS-CoV-2 by FDA under an Emergency Use Authorization (EUA). This EUA will remain  in effect (meaning this test can be used) for the duration of the COVID-19 declaration under Section 564(b)(1) of the Act, 21 U.S.C.section 360bbb-3(b)(1), unless the authorization is terminated  or revoked sooner.       Influenza A by PCR NEGATIVE NEGATIVE Final   Influenza B by PCR NEGATIVE NEGATIVE Final    Comment: (NOTE) The Xpert Xpress SARS-CoV-2/FLU/RSV plus assay is intended as an aid in the diagnosis of influenza from Nasopharyngeal swab specimens and should not be used as a sole basis for treatment. Nasal washings and aspirates are unacceptable for Xpert Xpress SARS-CoV-2/FLU/RSV testing.  Fact Sheet for Patients: EntrepreneurPulse.com.au  Fact Sheet for Healthcare Providers: IncredibleEmployment.be  This test is not yet approved or cleared by the [place] FDA and has been authorized for detection and/or diagnosis of SARS-CoV-2 by FDA under an Emergency Use Authorization (EUA). This EUA will remain in effect (meaning this test can be used) for the duration of  the COVID-19 declaration under Section 564(b)(1) of the Act, 21 U.S.C. section 360bbb-3(b)(1), unless the authorization is terminated or revoked.  Performed at [facility], [address], [place], [place] [address]   Urine culture     Status: None   Collection Time: 06/02/20  7:29 AM   Specimen: Urine, Catheterized  Result Value Ref Range Status   Specimen Description   Final    URINE, CATHETERIZED Performed  at [facility], [address], [place], [place] [address]    Special Requests   Final    Normal Performed at [facility], [address] [place], [place] [address]    Culture   Final    NO GROWTH Performed at [facility], [place] [address], [place], [place] [address]    Report Status 06/03/2020 FINAL  Final  Blood culture (routine x 2)     Status: None (Preliminary result)   Collection Time: 06/02/20  7:31 AM   Specimen: BLOOD RIGHT HAND  Result Value Ref Range Status   Specimen Description   Final    BLOOD RIGHT HAND BOTTLES DRAWN AEROBIC AND ANAEROBIC   Special Requests Blood Culture adequate volume  Final   Culture   Final    NO GROWTH 3 DAYS Performed at [facility], [address], [place], [place] [address]    Report Status PENDING  Incomplete  MRSA PCR Screening     Status: None   Collection Time: 06/02/20  4:48 PM   Specimen: Nasal Mucosa; Nasopharyngeal  Result Value Ref Range Status   MRSA by PCR NEGATIVE NEGATIVE Final    Comment:        The GeneXpert MRSA Assay (FDA approved for NASAL specimens only), is one component of a comprehensive MRSA colonization surveillance program. It is not intended to diagnose MRSA infection nor to guide or monitor treatment for MRSA infections. Performed at [facility], [address], [place], [place] [address]      Labs: BNP (last 3 results) No results for input(s): BNP in the last 8760 hours. Basic Metabolic Panel: Recent Labs  Lab 06/01/20 0537 06/02/20 0757 06/02/20 1316 06/03/20 0447  06/04/20 0458 06/05/20 0353  NA 138 137  --  140 136 141  K 3.9 3.4*  --  4.4 3.8 3.7  CL 103 100  --  104 100 103  CO2 27 27  --  28 25 28   GLUCOSE 110* 163*  --  111* 158* 100*  BUN 12 17  --  16 15 23   CREATININE 0.74 1.03 0.96 0.83 0.80 0.81  CALCIUM 8.8* 8.8*  --  9.0 8.5* 8.7*  MG  --   --   --  2.0  --  2.1   Liver Function Tests: Recent Labs  Lab 06/02/20 0757  AST 16  ALT 13  ALKPHOS 79  BILITOT 0.4  PROT 6.3*  ALBUMIN 3.2*   No results for input(s): LIPASE, AMYLASE in the last 168 hours. No results for input(s): AMMONIA in the last 168 hours. CBC: Recent Labs  Lab 06/02/20 0645 06/02/20 1316 06/03/20 0447 06/04/20 0458 06/05/20 0353  WBC 8.4 10.0 6.9 7.3 5.4  NEUTROABS 7.5  --   --   --   --   HGB 11.0* 10.0* 11.2* 9.5* 9.3*  HCT 35.1* 32.2* 36.5* 30.3* 30.3*  MCV 99.4 100.6* 102.0* 100.3* 101.3*  PLT 373 324 308 308 269   Cardiac Enzymes: No results for input(s): CKTOTAL, CKMB, CKMBINDEX, TROPONINI in the last 168 hours. BNP: Invalid input(s): POCBNP CBG: Recent Labs  Lab 06/03/20 2139 06/04/20 0748 06/04/20 1105 06/04/20 1551 06/04/20 2032  GLUCAP 134* 146* 151* 108* 170*   D-Dimer No results for input(s): DDIMER in the last 72 hours. Hgb A1c No results for input(s): HGBA1C in the last 72 hours. Lipid Profile No results for input(s): CHOL, HDL, LDLCALC, TRIG, CHOLHDL, LDLDIRECT in the last 72 hours. Thyroid function studies Recent Labs  06/02/20 1316 06/03/20 0827  TSH 0.117*  --   T3FREE  --  2.1   Anemia work up No results for input(s): VITAMINB12, FOLATE, FERRITIN, TIBC, IRON, RETICCTPCT in the last 72 hours. Urinalysis    Component Value Date/Time   COLORURINE AMBER (A) 06/02/2020 0729   APPEARANCEUR CLOUDY (A) 06/02/2020 0729   APPEARANCEUR Clear 05/24/2020 1306   LABSPEC 1.016 06/02/2020 0729   PHURINE 6.0 06/02/2020 0729   GLUCOSEU NEGATIVE 06/02/2020 0729   HGBUR LARGE (A) 06/02/2020 0729   BILIRUBINUR NEGATIVE  06/02/2020 0729   BILIRUBINUR Negative 05/24/2020 1306   KETONESUR NEGATIVE 06/02/2020 0729   PROTEINUR 100 (A) 06/02/2020 0729   NITRITE NEGATIVE 06/02/2020 0729   LEUKOCYTESUR NEGATIVE 06/02/2020 0729   Sepsis Labs Invalid input(s): PROCALCITONIN,  WBC,  LACTICIDVEN Microbiology Recent Results (from the past 240 hour(s))  SARS CORONAVIRUS 2 (TAT 6-24 HRS) Nasopharyngeal Nasopharyngeal Swab     Status: None   Collection Time: 05/29/20 11:17 AM   Specimen: Nasopharyngeal Swab  Result Value Ref Range Status   SARS Coronavirus 2 NEGATIVE NEGATIVE Final    Comment: (NOTE) SARS-CoV-2 target nucleic acids are NOT DETECTED.  The SARS-CoV-2 RNA is generally detectable in upper and lower respiratory specimens during the acute phase of infection. Negative results do not preclude SARS-CoV-2 infection, do not rule out co-infections with other pathogens, and should not be used as the sole basis for treatment or other patient management decisions. Negative results must be combined with clinical observations, patient history, and epidemiological information. The expected result is Negative.  Fact Sheet for Patients: SugarRoll.be  Fact Sheet for Healthcare Providers: https://www.woods-mathews.com/  This test is not yet approved or cleared by the [place] FDA and  has been authorized for detection and/or diagnosis of SARS-CoV-2 by FDA under an Emergency Use Authorization (EUA). This EUA will remain  in effect (meaning this test can be used) for the duration of the COVID-19 declaration under Se ction 564(b)(1) of the Act, 21 U.S.C. section 360bbb-3(b)(1), unless the authorization is terminated or revoked sooner.  Performed at [facility], [place] [address], [place], [place] [address]   Blood culture (routine x 2)     Status: None (Preliminary result)   Collection Time: 06/02/20  6:45 AM   Specimen: BLOOD RIGHT FOREARM  Result Value Ref  Range Status   Specimen Description   Final    BLOOD RIGHT FOREARM BOTTLES DRAWN AEROBIC AND ANAEROBIC   Special Requests   Final    Blood Culture results may not be optimal due to an inadequate volume of blood received in culture bottles   Culture   Final    NO GROWTH 3 DAYS Performed at [facility], [address], [place], [place] [address]    Report Status PENDING  Incomplete  Resp Panel by RT-PCR (Flu A&B, Covid) Urine, Catheterized     Status: None   Collection Time: 06/02/20  7:08 AM   Specimen: Urine, Catheterized; Nasopharyngeal(NP) swabs in vial transport medium  Result Value Ref Range Status   SARS Coronavirus 2 by RT PCR NEGATIVE NEGATIVE Final    Comment: (NOTE) SARS-CoV-2 target nucleic acids are NOT DETECTED.  The SARS-CoV-2 RNA is generally detectable in upper respiratory specimens during the acute phase of infection. The lowest concentration of SARS-CoV-2 viral copies this assay can detect is 138 copies/mL. A negative result does not preclude SARS-Cov-2 infection and should not be used as the sole basis for treatment or other patient management decisions. A  negative result may occur with  improper specimen collection/handling, submission of specimen other than nasopharyngeal swab, presence of viral mutation(s) within the areas targeted by this assay, and inadequate number of viral copies(<138 copies/mL). A negative result must be combined with clinical observations, patient history, and epidemiological information. The expected result is Negative.  Fact Sheet for Patients:  EntrepreneurPulse.com.au  Fact Sheet for Healthcare Providers:  IncredibleEmployment.be  This test is no t yet approved or cleared by the [place] FDA and  has been authorized for detection and/or diagnosis of SARS-CoV-2 by FDA under an Emergency Use Authorization (EUA). This EUA will remain  in effect (meaning this test can be used) for the duration  of the COVID-19 declaration under Section 564(b)(1) of the Act, 21 U.S.C.section 360bbb-3(b)(1), unless the authorization is terminated  or revoked sooner.       Influenza A by PCR NEGATIVE NEGATIVE Final   Influenza B by PCR NEGATIVE NEGATIVE Final    Comment: (NOTE) The Xpert Xpress SARS-CoV-2/FLU/RSV plus assay is intended as an aid in the diagnosis of influenza from Nasopharyngeal swab specimens and should not be used as a sole basis for treatment. Nasal washings and aspirates are unacceptable for Xpert Xpress SARS-CoV-2/FLU/RSV testing.  Fact Sheet for Patients: EntrepreneurPulse.com.au  Fact Sheet for Healthcare Providers: IncredibleEmployment.be  This test is not yet approved or cleared by the [place] FDA and has been authorized for detection and/or diagnosis of SARS-CoV-2 by FDA under an Emergency Use Authorization (EUA). This EUA will remain in effect (meaning this test can be used) for the duration of the COVID-19 declaration under Section 564(b)(1) of the Act, 21 U.S.C. section 360bbb-3(b)(1), unless the authorization is terminated or revoked.  Performed at [facility], [address], [place], [place] [address]   Urine culture     Status: None   Collection Time: 06/02/20  7:29 AM   Specimen: Urine, Catheterized  Result Value Ref Range Status   Specimen Description   Final    URINE, CATHETERIZED Performed at [facility], [address], [place], [place] [address]    Special Requests   Final    Normal Performed at [facility], [address], [place], [place] [address]    Culture   Final    NO GROWTH Performed at [facility], [place] [address] [place], [place] [address]    Report Status 06/03/2020 FINAL  Final  Blood culture (routine x 2)     Status: None (Preliminary result)   Collection Time: 06/02/20  7:31 AM   Specimen: BLOOD RIGHT HAND  Result Value Ref Range Status   Specimen Description   Final     BLOOD RIGHT HAND BOTTLES DRAWN AEROBIC AND ANAEROBIC   Special Requests Blood Culture adequate volume  Final   Culture   Final    NO GROWTH 3 DAYS Performed at [facility], [address] [place], [place] [address]    Report Status PENDING  Incomplete  MRSA PCR Screening     Status: None   Collection Time: 06/02/20  4:48 PM   Specimen: Nasal Mucosa; Nasopharyngeal  Result Value Ref Range Status   MRSA by PCR NEGATIVE NEGATIVE Final    Comment:        The GeneXpert MRSA Assay (FDA approved for NASAL specimens only), is one component of a comprehensive MRSA colonization surveillance program. It is not intended to diagnose MRSA infection nor to guide or monitor treatment for MRSA infections. Performed at [facility], [place]  [address], [place], [place] [address]      Time coordinating discharge: 35 minutes  SIGNED:   Rodena Goldmann, DO [facility] 06/05/2020, 8:17 AM  If 7PM-7AM, please contact night-coverage [email or web address]

## 2020-06-05 NOTE — Telephone Encounter (Signed)
Transition Care Management Unsuccessful Follow-up Telephone Call  Date of discharge and from where:  06/05/20 from [facility]  Attempts:  1st Attempt  Reason for unsuccessful TCM follow-up call:  Left voice message

## 2020-06-05 NOTE — Telephone Encounter (Signed)
Contact Date: 06/05/2020 Contacted By: Georgina Pillion, lpn   Transition Care Management Follow-up Telephone Call  Date of discharge and from where: 06/05/20 [facility]  Discharge Diagnosis:sepsis secondary from uti/TURP 2/24  How have you been since you were released from the hospital? [facility] so far  Any questions or concerns? No   Items Reviewed:  Did the pt receive and understand the discharge instructions provided? Yes   Medications obtained and verified? Yes   Any new allergies since your discharge? No   Dietary orders reviewed? Yes  Do you have support at home? Yes   Discontinued Medications macrobid New Medications Added bacrtim and new dose of synthroid  Current Medication List Allergies as of 06/05/2020      Reactions   Jardiance [empagliflozin] Other (See Comments)   FEELS SLUGGISH, TIRED   Lopressor [metoprolol] Other (See Comments)   Fatigue      Medication List       Accurate as of June 05, 2020 12:48 PM. If you have any questions, ask your nurse or doctor.        acetaminophen 500 MG tablet Commonly known as: TYLENOL Take 1,000 mg by mouth at bedtime as needed for moderate pain.   albuterol 108 (90 Base) MCG/ACT inhaler Commonly known as: VENTOLIN HFA Inhale 2 puffs into the lungs every 4 (four) hours as needed for wheezing or shortness of breath.   aspirin EC 81 MG tablet Take 81 mg by mouth daily.   bacitracin 500 UNIT/GM ointment Apply 1 application topically 2 (two) times daily.   cloNIDine 0.1 MG tablet Commonly known as: CATAPRES TAKE 1 TABLET (0.1 MG TOTAL) BY MOUTH 3 (THREE) TIMES DAILY.   dutasteride 0.5 MG capsule Commonly known as: AVODART TAKE 1 CAPSULE BY MOUTH EVERY DAY What changed: how much to take   fluticasone 50 MCG/ACT nasal spray Commonly known as: FLONASE Place 1 spray into both nostrils daily as needed for allergies or rhinitis.   furosemide 20 MG tablet Commonly known as: LASIX Take 1 tablet (20 mg total) by mouth  2 (two) times daily.   gabapentin 300 MG capsule Commonly known as: NEURONTIN Take 1 capsule (300 mg total) by mouth 3 (three) times daily. What changed:   how much to take  when to take this  additional instructions   HYDROcodone-acetaminophen 5-325 MG tablet Commonly known as: Norco Take 1 tablet by mouth every 6 (six) hours as needed for moderate pain.   levothyroxine 50 MCG tablet Commonly known as: SYNTHROID Take 1 tablet (50 mcg total) by mouth daily.   linaclotide 72 MCG capsule Commonly known as: Linzess Take 1 capsule (72 mcg total) by mouth daily before breakfast. What changed:   when to take this  reasons to take this   losartan 50 MG tablet Commonly known as: COZAAR Take 1 tablet (50 mg total) by mouth daily.   meloxicam 7.5 MG tablet Commonly known as: MOBIC Take 1 tablet (7.5 mg total) by mouth daily as needed for pain (back pain).   metFORMIN 500 MG 24 hr tablet Commonly known as: GLUCOPHAGE-XR Take 1 tablet (500 mg total) by mouth daily with breakfast.   metoprolol succinate 25 MG 24 hr tablet Commonly known as: TOPROL-XL Take 1.5 tablets (37.5 mg total) by mouth daily. Take with or immediately following a meal.   ondansetron 4 MG disintegrating tablet Commonly known as: Zofran ODT Take 1 tablet (4 mg total) by mouth every 8 (eight) hours as needed for nausea or vomiting.  OneTouch Delica Lancets 70W Misc Use to test blood sugars daily as directed. DX: E11.9   OneTouch Verio Reflect w/Device Kit Use to test blood sugars daily as directed. DX: E11.9   OneTouch Verio test strip Generic drug: glucose blood Use to test blood sugars daily as directed. DX: E11.9   polyethylene glycol 17 g packet Commonly known as: MIRALAX / GLYCOLAX Take 17 g by mouth daily as needed for moderate constipation or severe constipation.   pravastatin 40 MG tablet Commonly known as: PRAVACHOL TAKE 1 TABLET (40 MG TOTAL) BY MOUTH ON MONDAYS , WEDNESDAYS, AND  FRIDAYS AS DIRECTED What changed: See the new instructions.   Rybelsus 7 MG Tabs Generic drug: Semaglutide Take 7 mg by mouth daily.   sulfamethoxazole-trimethoprim 800-160 MG tablet Commonly known as: BACTRIM DS Take 1 tablet by mouth 2 (two) times daily for 7 days.   Trelegy Ellipta 100-62.5-25 MCG/INH Aepb Generic drug: Fluticasone-Umeclidin-Vilant Inhale 1 puff into the lungs daily.   zolpidem 5 MG tablet Commonly known as: AMBIEN Take 5 mg by mouth at bedtime as needed for sleep.        Home Care and Equipment/Supplies: Were home health services ordered? not applicable If so, what is the name of the agency? [facility] had [facility]  Has the agency set up a time to come to the patient's home? yes Were any new equipment or medical supplies ordered?  Yes: oxygen  What is the name of the medical supply agency? [facility]  Were you able to get the supplies/equipment? yes Do you have any questions related to the use of the equipment or supplies? No  Functional Questionnaire: (I = Independent and D = Dependent) ADLs: i  Bathing/Dressing- i  Meal Prep- i  Eating- i  Maintaining continence- i  Transferring/Ambulation- i  Managing Meds- i  Follow up appointments reviewed:   PCP Hospital f/u appt confirmed? Yes  Scheduled to see [facility] on 06/12/20 @ 1140am.  [facility] f/u appt confirmed? Yes  Scheduled to see urology on 06/08/20.  Are transportation arrangements needed? No   If their condition worsens, is the pt aware to call PCP or go to the Emergency Dept.? Yes  Was the patient provided with contact information for the PCP's office or ED? Yes  Was to pt encouraged to call back with questions or concerns? Yes

## 2020-06-05 NOTE — Progress Notes (Signed)
Subjective: Afebrile overnight. Urine clear. Oxygen weaned to 1L [place]. Urine and blood culture negative  Objective: Vital signs in last 24 hours: Temp:  [96 F (35.6 C)-98.7 F (37.1 C)] 96 F (35.6 C) (03/01 0426) Pulse Rate:  [85-113] 86 (03/01 0300) Resp:  [12-23] 15 (03/01 0300) BP: (101-138)/(48-71) 123/55 (03/01 0300) SpO2:  [83 %-100 %] 97 % (03/01 0300) Weight:  [75.4 kg] 75.4 kg (03/01 0426)  Intake/Output from previous day: 02/28 0701 - 03/01 0700 In: 340 [P.O.:240; IV Piggyback:100] Out: 1675 [Urine:1675] Intake/Output this shift: No intake/output data recorded.  Physical Exam:  General:alert, cooperative and appears stated age GI: not done Male genitalia: not done Extremities: extremities normal, atraumatic, no cyanosis or edema  Lab Results: Recent Labs    06/03/20 0447 06/04/20 0458 06/05/20 0353  HGB 11.2* 9.5* 9.3*  HCT 36.5* 30.3* 30.3*   BMET Recent Labs    06/04/20 0458 06/05/20 0353  NA 136 141  K 3.8 3.7  CL 100 103  CO2 25 28  GLUCOSE 158* 100*  BUN 15 23  CREATININE 0.80 0.81  CALCIUM 8.5* 8.7*   No results for input(s): LABPT, INR in the last 72 hours. No results for input(s): LABURIN in the last 72 hours. Results for orders placed or performed during the hospital encounter of 06/02/20  Blood culture (routine x 2)     Status: None (Preliminary result)   Collection Time: 06/02/20  6:45 AM   Specimen: BLOOD RIGHT FOREARM  Result Value Ref Range Status   Specimen Description   Final    BLOOD RIGHT FOREARM BOTTLES DRAWN AEROBIC AND ANAEROBIC   Special Requests   Final    Blood Culture results may not be optimal due to an inadequate volume of blood received in culture bottles   Culture   Final    NO GROWTH 3 DAYS Performed at [facility], [address], [place], [place] [address]    Report Status PENDING  Incomplete  Resp Panel by RT-PCR (Flu A&B, Covid) Urine, Catheterized     Status: None   Collection Time: 06/02/20  7:08 AM    Specimen: Urine, Catheterized; Nasopharyngeal(NP) swabs in vial transport medium  Result Value Ref Range Status   SARS Coronavirus 2 by RT PCR NEGATIVE NEGATIVE Final    Comment: (NOTE) SARS-CoV-2 target nucleic acids are NOT DETECTED.  The SARS-CoV-2 RNA is generally detectable in upper respiratory specimens during the acute phase of infection. The lowest concentration of SARS-CoV-2 viral copies this assay can detect is 138 copies/mL. A negative result does not preclude SARS-Cov-2 infection and should not be used as the sole basis for treatment or other patient management decisions. A negative result may occur with  improper specimen collection/handling, submission of specimen other than nasopharyngeal swab, presence of viral mutation(s) within the areas targeted by this assay, and inadequate number of viral copies(<138 copies/mL). A negative result must be combined with clinical observations, patient history, and epidemiological information. The expected result is Negative.  Fact Sheet for Patients:  EntrepreneurPulse.com.au  Fact Sheet for Healthcare Providers:  IncredibleEmployment.be  This test is no t yet approved or cleared by the [place] FDA and  has been authorized for detection and/or diagnosis of SARS-CoV-2 by FDA under an Emergency Use Authorization (EUA). This EUA will remain  in effect (meaning this test can be used) for the duration of the COVID-19 declaration under Section 564(b)(1) of the Act, 21 U.S.C.section 360bbb-3(b)(1), unless the authorization is terminated  or revoked sooner.  Influenza A by PCR NEGATIVE NEGATIVE Final   Influenza B by PCR NEGATIVE NEGATIVE Final    Comment: (NOTE) The Xpert Xpress SARS-CoV-2/FLU/RSV plus assay is intended as an aid in the diagnosis of influenza from Nasopharyngeal swab specimens and should not be used as a sole basis for treatment. Nasal washings and aspirates are  unacceptable for Xpert Xpress SARS-CoV-2/FLU/RSV testing.  Fact Sheet for Patients: EntrepreneurPulse.com.au  Fact Sheet for Healthcare Providers: IncredibleEmployment.be  This test is not yet approved or cleared by the [place] FDA and has been authorized for detection and/or diagnosis of SARS-CoV-2 by FDA under an Emergency Use Authorization (EUA). This EUA will remain in effect (meaning this test can be used) for the duration of the COVID-19 declaration under Section 564(b)(1) of the Act, 21 U.S.C. section 360bbb-3(b)(1), unless the authorization is terminated or revoked.  Performed at [facility], [address], [place], [place] [address]   Urine culture     Status: None   Collection Time: 06/02/20  7:29 AM   Specimen: Urine, Catheterized  Result Value Ref Range Status   Specimen Description   Final    URINE, CATHETERIZED Performed at [facility], [address], [place], [place] [address]    Special Requests   Final    Normal Performed at [facility], [address], [place], [place] [address]    Culture   Final    NO GROWTH Performed at [facility], [place] [address] [place], [place] [address]    Report Status 06/03/2020 FINAL  Final  Blood culture (routine x 2)     Status: None (Preliminary result)   Collection Time: 06/02/20  7:31 AM   Specimen: BLOOD RIGHT HAND  Result Value Ref Range Status   Specimen Description   Final    BLOOD RIGHT HAND BOTTLES DRAWN AEROBIC AND ANAEROBIC   Special Requests Blood Culture adequate volume  Final   Culture   Final    NO GROWTH 3 DAYS Performed at [facility], [address], [place],  [address]    Report Status PENDING  Incomplete  MRSA PCR Screening     Status: None   Collection Time: 06/02/20  4:48 PM   Specimen: Nasal Mucosa; Nasopharyngeal  Result Value Ref Range Status   MRSA by PCR NEGATIVE NEGATIVE Final    Comment:        The GeneXpert MRSA Assay  (FDA approved for NASAL specimens only), is one component of a comprehensive MRSA colonization surveillance program. It is not intended to diagnose MRSA infection nor to guide or monitor treatment for MRSA infections. Performed at [facility], [address] [place],  [address]     Studies/Results: [facility] Chest [place] 1 View  Result Date: 06/04/2020 CLINICAL DATA:  Shortness of breath EXAM: PORTABLE CHEST 1 VIEW COMPARISON:  06/02/2020 FINDINGS: The heart size is stable. Aortic calcifications are noted. There is a well-positioned left-sided Port-A-Cath place. The lung markings are similar to prior study with no new focal infiltrate or pneumothorax. There is no large pleural effusion. There are stable post treatment changes in the right hilum. Emphysematous changes are noted again noted. There are scattered pulmonary nodules which are similar to prior chest x-ray. IMPRESSION: No active disease. Electronically Signed   By: Constance Holster M.D.   On: 06/04/2020 02:18    Assessment/Plan: BPH s/p TURP readmitted for sepsis  Patient has been afebrile and cultures have been negative to date. He can be discharged urologic standpoint. Please start  bactrim DS BID for 7 days and he is scheduled to followup on Friday morning at [facility] for a voiding trial    LOS: 3 days   Nicolette Bang 06/05/2020, 7:44 AM

## 2020-06-05 NOTE — Progress Notes (Signed)
Pharmacy Antibiotic Note  John [name] is a 75 y.o. male admitted on 06/02/2020 with sepsis.  Pharmacy has been consulted for Cefepime dosing.  Plan: Cefepime 2000 mg IV every 8 hours. Monitor labs, c/s, and  Patient improvement.  Height: 5\' 7"  (170.2 cm) Weight: 75.4 kg (166 lb 3.6 oz) IBW/kg (Calculated) : 66.1  Temp (24hrs), Avg:97.8 F (36.6 C), Min:96 F (35.6 C), Max:98.7 F (37.1 C)  Recent Labs  Lab 06/02/20 0645 06/02/20 0731 06/02/20 0757 06/02/20 1316 06/03/20 0447 06/04/20 0458 06/05/20 0353  WBC 8.4  --   --  10.0 6.9 7.3 5.4  CREATININE  --   --  1.03 0.96 0.83 0.80 0.81  LATICACIDVEN 1.8 1.9  --   --   --   --   --     Estimated Creatinine Clearance: 74.8 mL/min (by C-G formula based on SCr of 0.81 mg/dL).    Allergies  Allergen Reactions  . Jardiance [Empagliflozin] Other (See Comments)    FEELS SLUGGISH, TIRED  . Lopressor [Metoprolol] Other (See Comments)    Fatigue    Antimicrobials this admission: Cefepime 2/26 >>  Microbiology results: 2/26 Bcx: ngtd 2/26 Ucx: ng MRSA PCR: neg  Thank you for allowing pharmacy to be a part of this patient's care.  Margot Ables, PharmD Clinical Pharmacist 06/05/2020 8:04 AM

## 2020-06-06 DIAGNOSIS — G9341 Metabolic encephalopathy: Secondary | ICD-10-CM | POA: Diagnosis not present

## 2020-06-06 DIAGNOSIS — N390 Urinary tract infection, site not specified: Secondary | ICD-10-CM | POA: Diagnosis not present

## 2020-06-06 DIAGNOSIS — T83518A Infection and inflammatory reaction due to other urinary catheter, initial encounter: Secondary | ICD-10-CM | POA: Diagnosis not present

## 2020-06-06 DIAGNOSIS — J440 Chronic obstructive pulmonary disease with acute lower respiratory infection: Secondary | ICD-10-CM | POA: Diagnosis not present

## 2020-06-06 DIAGNOSIS — N9989 Other postprocedural complications and disorders of genitourinary system: Secondary | ICD-10-CM | POA: Diagnosis not present

## 2020-06-06 DIAGNOSIS — B9620 Unspecified Escherichia coli [E. coli] as the cause of diseases classified elsewhere: Secondary | ICD-10-CM | POA: Diagnosis not present

## 2020-06-06 DIAGNOSIS — N179 Acute kidney failure, unspecified: Secondary | ICD-10-CM | POA: Diagnosis not present

## 2020-06-06 DIAGNOSIS — J441 Chronic obstructive pulmonary disease with (acute) exacerbation: Secondary | ICD-10-CM | POA: Diagnosis not present

## 2020-06-06 DIAGNOSIS — J209 Acute bronchitis, unspecified: Secondary | ICD-10-CM | POA: Diagnosis not present

## 2020-06-06 DIAGNOSIS — R6520 Severe sepsis without septic shock: Secondary | ICD-10-CM | POA: Diagnosis not present

## 2020-06-06 DIAGNOSIS — A4152 Sepsis due to Pseudomonas: Secondary | ICD-10-CM | POA: Diagnosis not present

## 2020-06-07 LAB — CULTURE, BLOOD (ROUTINE X 2)
Culture: NO GROWTH
Culture: NO GROWTH
Special Requests: ADEQUATE

## 2020-06-08 ENCOUNTER — Other Ambulatory Visit: Payer: Self-pay

## 2020-06-08 ENCOUNTER — Ambulatory Visit (INDEPENDENT_AMBULATORY_CARE_PROVIDER_SITE_OTHER): Payer: Medicare Other | Admitting: Urology

## 2020-06-08 VITALS — BP 144/71 | HR 102 | Temp 98.1°F | Ht 67.0 in | Wt 166.0 lb

## 2020-06-08 DIAGNOSIS — G9341 Metabolic encephalopathy: Secondary | ICD-10-CM | POA: Diagnosis not present

## 2020-06-08 DIAGNOSIS — R338 Other retention of urine: Secondary | ICD-10-CM | POA: Diagnosis not present

## 2020-06-08 DIAGNOSIS — E1165 Type 2 diabetes mellitus with hyperglycemia: Secondary | ICD-10-CM | POA: Diagnosis not present

## 2020-06-08 DIAGNOSIS — E039 Hypothyroidism, unspecified: Secondary | ICD-10-CM | POA: Diagnosis not present

## 2020-06-08 DIAGNOSIS — I509 Heart failure, unspecified: Secondary | ICD-10-CM | POA: Diagnosis not present

## 2020-06-08 DIAGNOSIS — N401 Enlarged prostate with lower urinary tract symptoms: Secondary | ICD-10-CM

## 2020-06-08 DIAGNOSIS — E1151 Type 2 diabetes mellitus with diabetic peripheral angiopathy without gangrene: Secondary | ICD-10-CM | POA: Diagnosis not present

## 2020-06-08 DIAGNOSIS — N179 Acute kidney failure, unspecified: Secondary | ICD-10-CM | POA: Diagnosis not present

## 2020-06-08 DIAGNOSIS — N9989 Other postprocedural complications and disorders of genitourinary system: Secondary | ICD-10-CM | POA: Diagnosis not present

## 2020-06-08 DIAGNOSIS — R6520 Severe sepsis without septic shock: Secondary | ICD-10-CM | POA: Diagnosis not present

## 2020-06-08 DIAGNOSIS — N138 Other obstructive and reflux uropathy: Secondary | ICD-10-CM | POA: Diagnosis not present

## 2020-06-08 DIAGNOSIS — J9611 Chronic respiratory failure with hypoxia: Secondary | ICD-10-CM | POA: Diagnosis not present

## 2020-06-08 DIAGNOSIS — E876 Hypokalemia: Secondary | ICD-10-CM | POA: Diagnosis not present

## 2020-06-08 DIAGNOSIS — A4152 Sepsis due to Pseudomonas: Secondary | ICD-10-CM | POA: Diagnosis not present

## 2020-06-08 DIAGNOSIS — F419 Anxiety disorder, unspecified: Secondary | ICD-10-CM | POA: Diagnosis not present

## 2020-06-08 DIAGNOSIS — Z466 Encounter for fitting and adjustment of urinary device: Secondary | ICD-10-CM | POA: Diagnosis not present

## 2020-06-08 DIAGNOSIS — E1140 Type 2 diabetes mellitus with diabetic neuropathy, unspecified: Secondary | ICD-10-CM | POA: Diagnosis not present

## 2020-06-08 DIAGNOSIS — G4733 Obstructive sleep apnea (adult) (pediatric): Secondary | ICD-10-CM | POA: Diagnosis not present

## 2020-06-08 DIAGNOSIS — J449 Chronic obstructive pulmonary disease, unspecified: Secondary | ICD-10-CM | POA: Diagnosis not present

## 2020-06-08 DIAGNOSIS — C3491 Malignant neoplasm of unspecified part of right bronchus or lung: Secondary | ICD-10-CM | POA: Diagnosis not present

## 2020-06-08 DIAGNOSIS — I110 Hypertensive heart disease with heart failure: Secondary | ICD-10-CM | POA: Diagnosis not present

## 2020-06-08 DIAGNOSIS — F32A Depression, unspecified: Secondary | ICD-10-CM | POA: Diagnosis not present

## 2020-06-08 DIAGNOSIS — N390 Urinary tract infection, site not specified: Secondary | ICD-10-CM | POA: Diagnosis not present

## 2020-06-08 DIAGNOSIS — M1990 Unspecified osteoarthritis, unspecified site: Secondary | ICD-10-CM | POA: Diagnosis not present

## 2020-06-08 DIAGNOSIS — E785 Hyperlipidemia, unspecified: Secondary | ICD-10-CM | POA: Diagnosis not present

## 2020-06-08 DIAGNOSIS — H3530 Unspecified macular degeneration: Secondary | ICD-10-CM | POA: Diagnosis not present

## 2020-06-08 NOTE — Progress Notes (Signed)
Urological Symptom Review  Patient is experiencing the following symptoms: none   Review of Systems  Gastrointestinal (upper)  : Nausea  Gastrointestinal (lower) : Negative for lower GI symptoms  Constitutional : Fatigue  Skin: Negative for skin symptoms  Eyes: Blurred vision  Ear/Nose/Throat : Negative for Ear/Nose/Throat symptoms  Hematologic/Lymphatic: Easy bruising  Cardiovascular : Negative for cardiovascular symptoms  Respiratory : Shortness of breath  Endocrine: Excessive thirst  Musculoskeletal: Joint pain  Neurological: Negative for neurological symptoms  Psychologic: Negative for psychiatric symptoms    Fill and Pull Catheter Removal  Patient is present today for a catheter removal.  Patient was cleaned and prepped in a sterile fashion 74ml of sterile water/ saline was instilled into the bladder when the patient felt the urge to urinate. 91ml of water was then drained from the balloon.  A 22FR foley cath was removed from the bladder no complications were noted .  Patient was instructed to go and drink plenty of fluids and try to void on his own.  Patient is to return to office this afternoon for PVR.  Patient tolerated well.  Performed by: Daniele Yankowski, lpn  Follow up/ Additional notes: this afternoon for pvr   Bladder Scan Patient can void: 0 ml Performed By: Durenda Guthrie, lpn

## 2020-06-08 NOTE — Progress Notes (Signed)
06/08/2020 10:00 AM   [place] Dec 25, 1945 [id number]  Referring provider: [name], [address] [place],  [place] [address]  Followup TURP  HPI: Mr Risko is a 75yo here for followup after TURP. Voiding trial passed today. No hematuria. Pathology benign   PMH: Past Medical History:  Diagnosis Date  . Adenocarcinoma of lung, right ([facility]) 04/18/2016  . Anxiety   . Arthritis   . Asthma   . BPH (benign prostatic hyperplasia)    with urinary retention 02/06/20  . CHF (congestive heart failure) ([facility])   . COPD (chronic obstructive pulmonary disease) ([facility])   . Depression   . Diabetes mellitus without complication (H[facility])    no meds  . Diabetes mellitus, type II ([facility])   . DM (diabetes mellitus) ([facility]) 07/09/2016  . Dyspnea   . History of kidney stones   . Hyperlipidemia   . Hypertension   . Hypertension   . Hypothyroidism   . Macular degeneration   . Neuropathy   . Non-small cell lung cancer, right ([facility]) 04/18/2016  . Peripheral vascular disease ([facility])   . Prostatitis   . Pulmonary nodule, left 07/16/2016  . Sleep apnea    cpap    Surgical History: Past Surgical History:  Procedure Laterality Date  . ABDOMINAL AORTOGRAM W/LOWER EXTREMITY Left 02/06/2020   Procedure: ABDOMINAL AORTOGRAM W/LOWER EXTREMITY;  Surgeon: Lorretta Harp, MD;  Location: [facility];  Service: Cardiovascular;  Laterality: Left;  . CATARACT EXTRACTION, BILATERAL Bilateral   . NO PAST SURGERIES    . PORTACATH PLACEMENT Left 06/13/2016   Procedure: INSERTION PORT-A-CATH;  Surgeon: Aviva Signs, MD;  Location: [facility];  Service: General;  Laterality: Left;  . TRANSURETHRAL RESECTION OF PROSTATE N/A 05/31/2020   Procedure: TRANSURETHRAL RESECTION OF THE PROSTATE (TURP);  Surgeon: Cleon Gustin, MD;  Location: [facility];  Service: Urology;  Laterality: N/A;  . VIDEO BRONCHOSCOPY WITH ENDOBRONCHIAL NAVIGATION N/A 05/28/2016   Procedure: VIDEO BRONCHOSCOPY WITH ENDOBRONCHIAL  NAVIGATION;  Surgeon: [name], MD;  Location: [facility];  Service: Thoracic;  Laterality: N/A;  . VIDEO BRONCHOSCOPY WITH ENDOBRONCHIAL ULTRASOUND N/A 05/28/2016   Procedure: VIDEO BRONCHOSCOPY WITH ENDOBRONCHIAL ULTRASOUND;  Surgeon: [name], MD;  Location: [facility];  Service: Thoracic;  Laterality: N/A;    Home Medications:  Allergies as of 06/08/2020      Reactions   Jardiance [empagliflozin] Other (See Comments)   FEELS SLUGGISH, TIRED   Lopressor [metoprolol] Other (See Comments)   Fatigue      Medication List       Accurate as of June 08, 2020 10:00 AM. If you have any questions, ask your nurse or doctor.        acetaminophen 500 MG tablet Commonly known as: TYLENOL Take 1,000 mg by mouth at bedtime as needed for moderate pain.   albuterol 108 (90 Base) MCG/ACT inhaler Commonly known as: VENTOLIN HFA Inhale 2 puffs into the lungs every 4 (four) hours as needed for wheezing or shortness of breath.   aspirin EC 81 MG tablet Take 81 mg by mouth daily.   bacitracin 500 UNIT/GM ointment Apply 1 application topically 2 (two) times daily.   cloNIDine 0.1 MG tablet Commonly known as: CATAPRES TAKE 1 TABLET (0.1 MG TOTAL) BY MOUTH 3 (THREE) TIMES DAILY.   dutasteride 0.5 MG capsule Commonly known as: AVODART TAKE 1 CAPSULE BY MOUTH EVERY DAY What changed: how much to take   fluticasone 50 MCG/ACT nasal spray Commonly  known as: FLONASE Place 1 spray into both nostrils daily as needed for allergies or rhinitis.   furosemide 20 MG tablet Commonly known as: LASIX Take 1 tablet (20 mg total) by mouth 2 (two) times daily.   gabapentin 300 MG capsule Commonly known as: NEURONTIN Take 1 capsule (300 mg total) by mouth 3 (three) times daily. What changed:   how much to take  when to take this  additional instructions   HYDROcodone-acetaminophen 5-325 MG tablet Commonly known as: Norco Take 1 tablet by mouth every 6 (six) hours as needed for  moderate pain.   levothyroxine 50 MCG tablet Commonly known as: SYNTHROID Take 1 tablet (50 mcg total) by mouth daily.   linaclotide 72 MCG capsule Commonly known as: Linzess Take 1 capsule (72 mcg total) by mouth daily before breakfast.   losartan 50 MG tablet Commonly known as: COZAAR Take 1 tablet (50 mg total) by mouth daily.   meloxicam 7.5 MG tablet Commonly known as: MOBIC Take 1 tablet (7.5 mg total) by mouth daily as needed for pain (back pain).   metFORMIN 500 MG 24 hr tablet Commonly known as: GLUCOPHAGE-XR Take 1 tablet (500 mg total) by mouth daily with breakfast.   metoprolol succinate 25 MG 24 hr tablet Commonly known as: TOPROL-XL Take 1.5 tablets (37.5 mg total) by mouth daily. Take with or immediately following a meal.   ondansetron 4 MG disintegrating tablet Commonly known as: Zofran ODT Take 1 tablet (4 mg total) by mouth every 8 (eight) hours as needed for nausea or vomiting.   OneTouch Delica Lancets 38B Misc Use to test blood sugars daily as directed. DX: E11.9   OneTouch Verio Reflect w/Device Kit Use to test blood sugars daily as directed. DX: E11.9   OneTouch Verio test strip Generic drug: glucose blood Use to test blood sugars daily as directed. DX: E11.9   polyethylene glycol 17 g packet Commonly known as: MIRALAX / GLYCOLAX Take 17 g by mouth daily as needed for moderate constipation or severe constipation.   pravastatin 40 MG tablet Commonly known as: PRAVACHOL TAKE 1 TABLET (40 MG TOTAL) BY MOUTH ON MONDAYS , WEDNESDAYS, AND FRIDAYS AS DIRECTED What changed: See the new instructions.   Rybelsus 7 MG Tabs Generic drug: Semaglutide Take 7 mg by mouth daily.   sulfamethoxazole-trimethoprim 800-160 MG tablet Commonly known as: BACTRIM DS Take 1 tablet by mouth 2 (two) times daily for 7 days.   Trelegy Ellipta 100-62.5-25 MCG/INH Aepb Generic drug: Fluticasone-Umeclidin-Vilant Inhale 1 puff into the lungs daily.   zolpidem 5 MG  tablet Commonly known as: AMBIEN Take 5 mg by mouth at bedtime as needed for sleep.       Allergies:  Allergies  Allergen Reactions  . Jardiance [Empagliflozin] Other (See Comments)    FEELS SLUGGISH, TIRED  . Lopressor [Metoprolol] Other (See Comments)    Fatigue    Family History: Family History  Problem Relation Age of Onset  . Hypertension Mother   . Diabetes Father   . Heart disease Father   . Stroke Father   . Hypertension Sister     Social History:  reports that he has been smoking cigarettes. He started smoking about 59 years ago. He has a 27.50 pack-year smoking history. He has never used smokeless tobacco. He reports that he does not drink alcohol and does not use drugs.  ROS: All other review of systems were reviewed and are negative except what is noted above in HPI  Physical Exam:  There were no vitals taken for this visit.  Constitutional:  Alert and oriented, No acute distress. HEENT: [place] AT, moist mucus membranes.  Trachea midline, no masses. Cardiovascular: No clubbing, cyanosis, or edema. Respiratory: Normal respiratory effort, no increased work of breathing. GI: Abdomen is soft, nontender, nondistended, no abdominal masses GU: No CVA tenderness.  Lymph: No cervical or inguinal lymphadenopathy. Skin: No rashes, bruises or suspicious lesions. Neurologic: Grossly intact, no focal deficits, moving all 4 extremities. Psychiatric: Normal mood and affect.  Laboratory Data: Lab Results  Component Value Date   WBC 5.4 06/05/2020   HGB 9.3 (L) 06/05/2020   HCT 30.3 (L) 06/05/2020   MCV 101.3 (H) 06/05/2020   PLT 269 06/05/2020    Lab Results  Component Value Date   CREATININE 0.81 06/05/2020    No results found for: PSA  No results found for: TESTOSTERONE  Lab Results  Component Value Date   HGBA1C 6.7 (H) 05/06/2020    Urinalysis    Component Value Date/Time   COLORURINE AMBER (A) 06/02/2020 0729   APPEARANCEUR CLOUDY (A) 06/02/2020 0729    APPEARANCEUR Clear 05/24/2020 1306   LABSPEC 1.016 06/02/2020 0729   PHURINE 6.0 06/02/2020 0729   GLUCOSEU NEGATIVE 06/02/2020 0729   HGBUR LARGE (A) 06/02/2020 0729   BILIRUBINUR NEGATIVE 06/02/2020 0729   BILIRUBINUR Negative 05/24/2020 1306   KETONESUR NEGATIVE 06/02/2020 0729   PROTEINUR 100 (A) 06/02/2020 0729   NITRITE NEGATIVE 06/02/2020 0729   LEUKOCYTESUR NEGATIVE 06/02/2020 0729    Lab Results  Component Value Date   LABMICR See below: 05/24/2020   WBCUA None seen 05/24/2020   LABEPIT 0-10 05/24/2020   BACTERIA NONE SEEN 06/02/2020    Pertinent Imaging:  Results for orders placed in visit on 12/30/19  DG Abd 1 View  Narrative CLINICAL DATA:  Abdominal pain  EXAM: ABDOMEN - 1 VIEW  COMPARISON:  06/05/2017  FINDINGS: Scattered large and small bowel gas is noted. Mild retained fecal material is seen without obstructive change. Degenerative change of the lumbar spine is noted. No abnormal calcifications are seen.  IMPRESSION: Mild retained fecal material.   Electronically Signed By: Inez Catalina M.D. On: 01/01/2020 15:15  Results for orders placed during the hospital encounter of 08/28/16  US Venous Img Lower Bilateral  Narrative CLINICAL DATA:  Bilateral leg swelling.  Treatment for lung cancer.  EXAM: BILATERAL LOWER EXTREMITY VENOUS DOPPLER ULTRASOUND  TECHNIQUE: Gray-scale sonography with graded compression, as well as color Doppler and duplex ultrasound were performed to evaluate the lower extremity deep venous systems from the level of the common femoral vein and including the common femoral, femoral, profunda femoral, popliteal and calf veins including the posterior tibial, peroneal and gastrocnemius veins when visible. The superficial great saphenous vein was also interrogated. Spectral Doppler was utilized to evaluate flow at rest and with distal augmentation maneuvers in the common femoral, femoral and popliteal  veins.  COMPARISON:  None.  FINDINGS: RIGHT LOWER EXTREMITY  Common Femoral Vein: No evidence of thrombus. Normal compressibility, respiratory phasicity and response to augmentation.  Saphenofemoral Junction: No evidence of thrombus. Normal compressibility and flow on color Doppler imaging.  Profunda Femoral Vein: No evidence of thrombus. Normal compressibility and flow on color Doppler imaging.  Femoral Vein: No evidence of thrombus. Normal compressibility, respiratory phasicity and response to augmentation.  Popliteal Vein: No evidence of thrombus. Normal compressibility, respiratory phasicity and response to augmentation.  Calf Veins: No evidence of thrombus. Normal compressibility and flow on color Doppler imaging.  Superficial Great Saphenous Vein: No evidence of thrombus. Normal compressibility and flow on color Doppler imaging.  Venous Reflux:  None.  Other Findings:  None.  LEFT LOWER EXTREMITY  Common Femoral Vein: No evidence of thrombus. Normal compressibility, respiratory phasicity and response to augmentation.  Saphenofemoral Junction: No evidence of thrombus. Normal compressibility and flow on color Doppler imaging.  Profunda Femoral Vein: No evidence of thrombus. Normal compressibility and flow on color Doppler imaging.  Femoral Vein: No evidence of thrombus. Normal compressibility, respiratory phasicity and response to augmentation.  Popliteal Vein: No evidence of thrombus. Normal compressibility, respiratory phasicity and response to augmentation.  Calf Veins: No evidence of thrombus. Normal compressibility and flow on color Doppler imaging.  Superficial Great Saphenous Vein: No evidence of thrombus. Normal compressibility and flow on color Doppler imaging.  Venous Reflux:  None.  Other Findings:  None.  IMPRESSION: No evidence of DVT within either lower extremity.   Electronically Signed By: Franchot Gallo M.D. On: 08/28/2016  14:17  No results found for this or any previous visit.  No results found for this or any previous visit.  No results found for this or any previous visit.  No results found for this or any previous visit.  No results found for this or any previous visit.  No results found for this or any previous visit.   Assessment & Plan:    1. BPH with urinary obstruction -RTC 1 week for PVR   No follow-ups on file.  Nicolette Bang, MD  [facility] [place]

## 2020-06-11 DIAGNOSIS — A4152 Sepsis due to Pseudomonas: Secondary | ICD-10-CM | POA: Diagnosis not present

## 2020-06-11 DIAGNOSIS — G9341 Metabolic encephalopathy: Secondary | ICD-10-CM | POA: Diagnosis not present

## 2020-06-11 DIAGNOSIS — N179 Acute kidney failure, unspecified: Secondary | ICD-10-CM | POA: Diagnosis not present

## 2020-06-11 DIAGNOSIS — N9989 Other postprocedural complications and disorders of genitourinary system: Secondary | ICD-10-CM | POA: Diagnosis not present

## 2020-06-11 DIAGNOSIS — N390 Urinary tract infection, site not specified: Secondary | ICD-10-CM | POA: Diagnosis not present

## 2020-06-11 DIAGNOSIS — R6520 Severe sepsis without septic shock: Secondary | ICD-10-CM | POA: Diagnosis not present

## 2020-06-12 ENCOUNTER — Ambulatory Visit (INDEPENDENT_AMBULATORY_CARE_PROVIDER_SITE_OTHER): Payer: Medicare Other | Admitting: Family

## 2020-06-12 ENCOUNTER — Encounter: Payer: Self-pay | Admitting: Family

## 2020-06-12 ENCOUNTER — Encounter: Payer: Self-pay | Admitting: Urology

## 2020-06-12 ENCOUNTER — Other Ambulatory Visit: Payer: Self-pay

## 2020-06-12 VITALS — BP 126/55 | HR 105 | Temp 97.7°F | Ht 67.0 in | Wt 172.0 lb

## 2020-06-12 DIAGNOSIS — Z09 Encounter for follow-up examination after completed treatment for conditions other than malignant neoplasm: Secondary | ICD-10-CM

## 2020-06-12 DIAGNOSIS — F5101 Primary insomnia: Secondary | ICD-10-CM

## 2020-06-12 DIAGNOSIS — N138 Other obstructive and reflux uropathy: Secondary | ICD-10-CM

## 2020-06-12 DIAGNOSIS — F17210 Nicotine dependence, cigarettes, uncomplicated: Secondary | ICD-10-CM | POA: Diagnosis not present

## 2020-06-12 DIAGNOSIS — Z87440 Personal history of urinary (tract) infections: Secondary | ICD-10-CM

## 2020-06-12 DIAGNOSIS — N401 Enlarged prostate with lower urinary tract symptoms: Secondary | ICD-10-CM

## 2020-06-12 DIAGNOSIS — B370 Candidal stomatitis: Secondary | ICD-10-CM | POA: Diagnosis not present

## 2020-06-12 LAB — URINALYSIS, COMPLETE
Bilirubin, UA: NEGATIVE
Glucose, UA: NEGATIVE
Ketones, UA: NEGATIVE
Nitrite, UA: NEGATIVE
Specific Gravity, UA: 1.01 (ref 1.005–1.030)
Urobilinogen, Ur: 0.2 mg/dL (ref 0.2–1.0)
pH, UA: 6 (ref 5.0–7.5)

## 2020-06-12 LAB — MICROSCOPIC EXAMINATION

## 2020-06-12 MED ORDER — NYSTATIN 100000 UNIT/ML MT SUSP: 5.0000 mL | Freq: Four times a day (QID) | OROMUCOSAL | 2 refills | Status: DC

## 2020-06-12 MED ORDER — ZOLPIDEM TARTRATE 5 MG PO TABS: 5.0000 mg | ORAL_TABLET | Freq: Every evening | ORAL | 1 refills | Status: DC | PRN

## 2020-06-12 NOTE — Addendum Note (Signed)
Addended by: Evelina Dun A on: 06/12/2020 01:30 PM   Modules accepted: Orders

## 2020-06-12 NOTE — Patient Instructions (Signed)
Oral Thrush, Adult Oral thrush, also called oral candidiasis, is a fungal infection that develops in the mouth and throat and on the tongue. It causes white patches to form in the mouth and on the tongue. Many cases of thrush are mild, but this infection can also be serious. John [name] can be a repeated (recurrent) problem for certain people who have a weak body defense system (immune system). The weakness can be caused by chronic illnesses, or by taking medicines that limit the body's ability to fight infection. If a person has difficulty fighting infection, the fungus that causes thrush can spread through the body. This can cause life-threatening blood or organ infections. What are the causes? This condition is caused by a fungus (yeast) called Candida albicans.  This fungus is normally present in small amounts in the mouth and on other mucous membranes. It usually causes no harm.  If conditions are present that allow the fungus to grow without control, it invades surrounding tissues and becomes an infection.  Other Candida species can also lead to thrush, though this is rare. What increases the risk? The following factors may make you more likely to develop this condition:  Having a weakened immune system.  Being an older adult.  Having diabetes, cancer, or HIV (human immunodeficiency virus).  Having dry mouth (xerostomia).  Being pregnant or breastfeeding.  Having poor dental care, especially in those who have dentures.  Using antibiotic or steroid medicines. What are the signs or symptoms? Symptoms of this condition can vary from mild and moderate to severe and persistent. Symptoms may include:  A burning feeling in the mouth and throat. This can occur at the start of a thrush infection.  White patches that stick to the mouth and tongue. The tissue around the patches may be red, raw, and painful. If rubbed (during tooth brushing, for example), the patches and the tissue of the mouth  may bleed easily.  A bad taste in the mouth or difficulty tasting foods.  A cottony feeling in the mouth.  Pain during eating and swallowing.  Poor appetite.  Cracking at the corners of the mouth.   How is this diagnosed? This condition is diagnosed based on:  A physical exam.  Your medical history. How is this treated? This condition is treated with medicines called antifungals, which prevent the growth of fungi. These medicines are either applied directly to the affected area (topical) or swallowed (oral). The treatment will depend on the severity of the condition.  Mild cases of thrush may be treated with an antifungal mouth rinse or lozenges. Treatment usually lasts about 14 days.  Moderate to severe cases of thrush can be treated with oral antifungal medicine, if they have spread to the esophagus. A topical antifungal medicine may also be used. For some severe infections, treatment may need to continue for more than 14 days. ? Oral antifungal medicines are rarely used during pregnancy because they may be harmful to the unborn child. If you are pregnant, talk with your health care provider about options for treatment.  Persistent or recurrent thrush. For cases of thrush that do not go away or keep coming back: ? Treatment may be needed twice as long as the symptoms last. ? Treatment will include both oral and topical antifungal medicines. ? People with a weakened immune system can take an antifungal medicine on a continuous basis to prevent thrush infections. It is important to treat conditions that make a person more likely to get thrush, such as  diabetes or HIV. Follow these instructions at home: Medicines  Take or use over-the-counter and prescription medicines only as told by your health care provider.  Talk with your health care provider about an over-the-counter medicine called gentian violet, which kills bacteria and fungi. Relieving soreness and discomfort To help  reduce the discomfort of thrush:  Drink cold liquids such as water or iced tea.  Try flavored ice treats or frozen juices.  Eat foods that are easy to swallow, such as gelatin, ice cream, or custard.  Try drinking from a straw if the patches in your mouth are painful.   General instructions  Eat plain, unflavored yogurt as directed by your health care provider. Check the label to make sure the yogurt contains live cultures. This yogurt can help healthy bacteria grow in the mouth and can stop the growth of the fungus that causes thrush.  If you wear dentures, remove the dentures before going to bed, brush them vigorously, and soak them in a cleaning solution as directed by your health care provider.  Rinse your mouth with a warm salt-water mixture several times a day. To make a salt-water mixture, dissolve -1 tsp (3-6 g) of salt in 1 cup (237 mL) of warm water. Contact a health care provider if:  Your symptoms are getting worse or are not improving within 7 days of starting treatment.  You have symptoms of a spreading infection, such as white patches on the skin outside of the mouth.  You are breastfeeding your baby and you have redness and pain in the nipples. Summary  Oral thrush, also called oral candidiasis, is a fungal infection that develops in the mouth and throat and on the tongue. It causes white patches to form in the mouth and on the tongue.  You are more likely to get this condition if you have a weakened immune system or an underlying condition, such as HIV, cancer, or diabetes.  This condition is treated with medicines called antifungals, which prevent the growth of fungi.  Contact a health care provider if your symptoms do not improve, or get worse, within 7 days of starting treatment. This information is not intended to replace advice given to you by your health care provider. Make sure you discuss any questions you have with your health care provider. Document  Revised: 01/28/2019 Document Reviewed: 01/28/2019 Elsevier Patient Education  [place].

## 2020-06-12 NOTE — Progress Notes (Signed)
Subjective:    Patient ID: John [name], male    DOB: 02-20-1946, 75 y.o.   MRN: [id number]  Chief Complaint  Patient presents with  . Transitions Of Care    HPI Today's visit was for Transitional Care Management.  The patient was discharged from [facility] on 06/05/20 with a primary diagnosis of sepsis secondary from UTI/TURP .   Contact with the patient and/or caregiver, by a clinical staff member, was made on 06/05/20 and was documented as a telephone encounter within the EMR.  Through chart review and discussion with the patient I have determined that management of their condition is of moderate complexity.    He was discharged on Bactrim and completed this AM. He has a follow up with Urologists on 06/15/20 for voiding trial. His cathter was removed 06/08/20. He reports he doing well. He is currently using disposable briefs for rare incontinence.    His levothyroxine was also decreased to 50 mcg from 75 mcg.   He is also complaining of oral thrush since being discharged from the hospital. White coating on his tongue.   He also requesting a refill on his Ambien today.   Review of Systems  All other systems reviewed and are negative.      Objective:   Physical Exam Vitals reviewed.  Constitutional:      General: He is not in acute distress.    Appearance: He is well-developed and well-nourished.  HENT:     Head: Normocephalic.     Right Ear: Tympanic membrane normal.     Left Ear: Tympanic membrane normal.     Mouth/Throat:     Mouth: Oropharynx is clear and moist.  Eyes:     General:        Right eye: No discharge.        Left eye: No discharge.     Pupils: Pupils are equal, round, and reactive to light.  Neck:     Thyroid: No thyromegaly.  Cardiovascular:     Rate and Rhythm: Normal rate and regular rhythm.     Pulses: Intact distal pulses.     Heart sounds: Normal heart sounds. No murmur heard.   Pulmonary:     Effort: Pulmonary effort is normal. No  respiratory distress.     Breath sounds: Normal breath sounds. No wheezing.  Abdominal:     General: Bowel sounds are normal. There is no distension.     Palpations: Abdomen is soft.     Tenderness: There is no abdominal tenderness.  Musculoskeletal:        General: No tenderness or edema. Normal range of motion.     Cervical back: Normal range of motion and neck supple.  Skin:    General: Skin is warm and dry.     Coloration: Skin is pale.     Findings: No erythema or rash.  Neurological:     Mental Status: He is alert and oriented to person, place, and time.     Cranial Nerves: No cranial nerve deficit.     Deep Tendon Reflexes: Reflexes are normal and symmetric.  Psychiatric:        Mood and Affect: Mood and affect normal.        Behavior: Behavior normal.        Thought Content: Thought content normal.        Judgment: Judgment normal.       BP (!) 126/55   Pulse (!) 105   Temp 97.7 F (  36.5 C) (Temporal)   Ht _0  (1.702 m)   Wt 172 lb (78 kg)   BMI 26.94 kg/m      Assessment & Plan:  John [name] comes in today with chief complaint of Transitions Of Care   Diagnosis and orders addressed:  1. Hospital discharge follow-up - BMP8+EGFR - CBC with Differential/Platelet  2. BPH with urinary obstruction - BMP8+EGFR - CBC with Differential/Platelet  3. History Acute cystitis with hematuria - BMP8+EGFR - CBC with Differential/Platelet  4. Oral thrush - nystatin (MYCOSTATIN) 100000 UNIT/ML suspension; Take 5 mLs (500,000 Units total) by mouth 4 (four) times daily.  Dispense: 473 mL; Refill: 2 - BMP8+EGFR - CBC with Differential/Platelet  5. Primary insomnia - zolpidem (AMBIEN) 5 MG tablet; Take 1 tablet (5 mg total) by mouth at bedtime as needed for sleep.  Dispense: 90 tablet; Refill: 1 - BMP8+EGFR - CBC with Differential/Platelet  6. Cigarette smoker - BMP8+EGFR - CBC with Differential/Platelet   Labs pending Health Maintenance reviewed Diet  and exercise encouraged  Follow up plan: 3 months    Evelina Dun, FNP

## 2020-06-12 NOTE — Patient Instructions (Signed)

## 2020-06-13 ENCOUNTER — Other Ambulatory Visit: Payer: Self-pay | Admitting: Family

## 2020-06-13 ENCOUNTER — Other Ambulatory Visit: Payer: Self-pay | Admitting: Family Medicine

## 2020-06-13 DIAGNOSIS — E875 Hyperkalemia: Secondary | ICD-10-CM

## 2020-06-13 LAB — BMP8+EGFR
BUN/Creatinine Ratio: 12 (ref 10–24)
BUN: 13 mg/dL (ref 8–27)
CO2: 23 mmol/L (ref 20–29)
Calcium: 9.1 mg/dL (ref 8.6–10.2)
Chloride: 100 mmol/L (ref 96–106)
Creatinine, Ser: 1.05 mg/dL (ref 0.76–1.27)
Glucose: 153 mg/dL — ABNORMAL HIGH (ref 65–99)
Potassium: 5.7 mmol/L — ABNORMAL HIGH (ref 3.5–5.2)
Sodium: 138 mmol/L (ref 134–144)
eGFR: 74 mL/min/{1.73_m2} (ref >59)

## 2020-06-13 LAB — CBC WITH DIFFERENTIAL/PLATELET
Basophils Absolute: 0.1 10*3/uL (ref 0.0–0.2)
Basos: 1 %
EOS (ABSOLUTE): 0.1 10*3/uL (ref 0.0–0.4)
Eos: 1 %
Hematocrit: 31.3 % — ABNORMAL LOW (ref 37.5–51.0)
Hemoglobin: 10.4 g/dL — ABNORMAL LOW (ref 13.0–17.7)
Immature Grans (Abs): 0.1 10*3/uL (ref 0.0–0.1)
Immature Granulocytes: 1 %
Lymphocytes Absolute: 0.7 10*3/uL (ref 0.7–3.1)
Lymphs: 6 %
MCH: 30.8 pg (ref 26.6–33.0)
MCHC: 33.2 g/dL (ref 31.5–35.7)
MCV: 93 fL (ref 79–97)
Monocytes Absolute: 0.8 10*3/uL (ref 0.1–0.9)
Monocytes: 7 %
Neutrophils Absolute: 9.4 10*3/uL — ABNORMAL HIGH (ref 1.4–7.0)
Neutrophils: 84 %
Platelets: 418 10*3/uL (ref 150–450)
RBC: 3.38 x10E6/uL — ABNORMAL LOW (ref 4.14–5.80)
RDW: 14.2 % (ref 11.6–15.4)
WBC: 11 10*3/uL — ABNORMAL HIGH (ref 3.4–10.8)

## 2020-06-14 ENCOUNTER — Other Ambulatory Visit: Payer: Self-pay | Admitting: Family

## 2020-06-14 ENCOUNTER — Other Ambulatory Visit: Payer: Medicare Other

## 2020-06-14 ENCOUNTER — Other Ambulatory Visit: Payer: Self-pay

## 2020-06-14 DIAGNOSIS — A4152 Sepsis due to Pseudomonas: Secondary | ICD-10-CM | POA: Diagnosis not present

## 2020-06-14 DIAGNOSIS — N390 Urinary tract infection, site not specified: Secondary | ICD-10-CM | POA: Diagnosis not present

## 2020-06-14 DIAGNOSIS — N179 Acute kidney failure, unspecified: Secondary | ICD-10-CM | POA: Diagnosis not present

## 2020-06-14 DIAGNOSIS — N9989 Other postprocedural complications and disorders of genitourinary system: Secondary | ICD-10-CM | POA: Diagnosis not present

## 2020-06-14 DIAGNOSIS — G9341 Metabolic encephalopathy: Secondary | ICD-10-CM | POA: Diagnosis not present

## 2020-06-14 DIAGNOSIS — E875 Hyperkalemia: Secondary | ICD-10-CM | POA: Diagnosis not present

## 2020-06-14 DIAGNOSIS — R6520 Severe sepsis without septic shock: Secondary | ICD-10-CM | POA: Diagnosis not present

## 2020-06-14 LAB — URINE CULTURE

## 2020-06-15 ENCOUNTER — Ambulatory Visit (INDEPENDENT_AMBULATORY_CARE_PROVIDER_SITE_OTHER): Payer: Medicare Other

## 2020-06-15 ENCOUNTER — Telehealth: Payer: Self-pay

## 2020-06-15 DIAGNOSIS — N138 Other obstructive and reflux uropathy: Secondary | ICD-10-CM

## 2020-06-15 DIAGNOSIS — N401 Enlarged prostate with lower urinary tract symptoms: Secondary | ICD-10-CM | POA: Diagnosis not present

## 2020-06-15 LAB — CMP14+EGFR
ALT: 15 IU/L (ref 0–44)
AST: 18 IU/L (ref 0–40)
Albumin/Globulin Ratio: 1.4 (ref 1.2–2.2)
Albumin: 4.1 g/dL (ref 3.7–4.7)
Alkaline Phosphatase: 140 IU/L — ABNORMAL HIGH (ref 44–121)
BUN/Creatinine Ratio: 12 (ref 10–24)
BUN: 11 mg/dL (ref 8–27)
Bilirubin Total: 0.2 mg/dL (ref 0.0–1.2)
CO2: 21 mmol/L (ref 20–29)
Calcium: 9.7 mg/dL (ref 8.6–10.2)
Chloride: 98 mmol/L (ref 96–106)
Creatinine, Ser: 0.94 mg/dL (ref 0.76–1.27)
Globulin, Total: 2.9 g/dL (ref 1.5–4.5)
Glucose: 115 mg/dL — ABNORMAL HIGH (ref 65–99)
Potassium: 5.3 mmol/L — ABNORMAL HIGH (ref 3.5–5.2)
Sodium: 136 mmol/L (ref 134–144)
Total Protein: 7 g/dL (ref 6.0–8.5)
eGFR: 85 mL/min/{1.73_m2} (ref >59)

## 2020-06-15 LAB — BLADDER SCAN AMB NON-IMAGING: Scan Result: 0

## 2020-06-15 LAB — URINALYSIS, ROUTINE W REFLEX MICROSCOPIC
Bilirubin, UA: NEGATIVE
Glucose, UA: NEGATIVE
Ketones, UA: NEGATIVE
Nitrite, UA: NEGATIVE
Protein,UA: NEGATIVE
Specific Gravity, UA: <1.005 — ABNORMAL LOW (ref 1.005–1.030)
Urobilinogen, Ur: 0.2 mg/dL (ref 0.2–1.0)
pH, UA: 6 (ref 5.0–7.5)

## 2020-06-15 LAB — MICROSCOPIC EXAMINATION: Renal Epithel, UA: NONE SEEN /HPF

## 2020-06-15 MED ORDER — NITROFURANTOIN MONOHYD MACRO 100 MG PO CAPS: 100.0000 mg | ORAL_CAPSULE | Freq: Two times a day (BID) | ORAL | 0 refills | Status: DC

## 2020-06-15 NOTE — Telephone Encounter (Signed)
See result note.  

## 2020-06-15 NOTE — Telephone Encounter (Signed)
Looks like lab work was done yesterday- please review and advise

## 2020-06-15 NOTE — Progress Notes (Signed)
Patient here today for PVR  PVR- 0   Reports dysuria at times and increase frequency. Patient had culture done on 03/08 with PCP. Mixed urogenital flora noted on culture report.   Dr. Alyson Ingles aware.   UA obtained- with culture   Antibiotic sent to cover until culture results.

## 2020-06-18 ENCOUNTER — Telehealth: Payer: Self-pay

## 2020-06-18 ENCOUNTER — Ambulatory Visit: Payer: Medicare Other | Admitting: Family

## 2020-06-18 MED ORDER — CLOTRIMAZOLE-BETAMETHASONE 1-0.05 % EX CREA: 1.0000 "application " | TOPICAL_CREAM | Freq: Two times a day (BID) | CUTANEOUS | 0 refills | Status: DC

## 2020-06-18 NOTE — Telephone Encounter (Signed)
Patient called this am stating he was given sulfa after being discharged from hospital several week ago and had a reaction with tongue swelling and redness. PCP gave patient mouth wash.  Pt reporting now he is "swollen in testicle" and really red.  Message sent to MD for advice.

## 2020-06-19 DIAGNOSIS — R6520 Severe sepsis without septic shock: Secondary | ICD-10-CM | POA: Diagnosis not present

## 2020-06-19 DIAGNOSIS — G9341 Metabolic encephalopathy: Secondary | ICD-10-CM | POA: Diagnosis not present

## 2020-06-19 DIAGNOSIS — A4152 Sepsis due to Pseudomonas: Secondary | ICD-10-CM | POA: Diagnosis not present

## 2020-06-19 DIAGNOSIS — N179 Acute kidney failure, unspecified: Secondary | ICD-10-CM | POA: Diagnosis not present

## 2020-06-19 DIAGNOSIS — N9989 Other postprocedural complications and disorders of genitourinary system: Secondary | ICD-10-CM | POA: Diagnosis not present

## 2020-06-19 DIAGNOSIS — N390 Urinary tract infection, site not specified: Secondary | ICD-10-CM | POA: Diagnosis not present

## 2020-06-21 DIAGNOSIS — A4152 Sepsis due to Pseudomonas: Secondary | ICD-10-CM | POA: Diagnosis not present

## 2020-06-21 DIAGNOSIS — N179 Acute kidney failure, unspecified: Secondary | ICD-10-CM | POA: Diagnosis not present

## 2020-06-21 DIAGNOSIS — R6520 Severe sepsis without septic shock: Secondary | ICD-10-CM | POA: Diagnosis not present

## 2020-06-21 DIAGNOSIS — N390 Urinary tract infection, site not specified: Secondary | ICD-10-CM | POA: Diagnosis not present

## 2020-06-21 DIAGNOSIS — G9341 Metabolic encephalopathy: Secondary | ICD-10-CM | POA: Diagnosis not present

## 2020-06-21 DIAGNOSIS — N9989 Other postprocedural complications and disorders of genitourinary system: Secondary | ICD-10-CM | POA: Diagnosis not present

## 2020-06-27 DIAGNOSIS — R6520 Severe sepsis without septic shock: Secondary | ICD-10-CM | POA: Diagnosis not present

## 2020-06-27 DIAGNOSIS — A4152 Sepsis due to Pseudomonas: Secondary | ICD-10-CM | POA: Diagnosis not present

## 2020-06-27 DIAGNOSIS — N9989 Other postprocedural complications and disorders of genitourinary system: Secondary | ICD-10-CM | POA: Diagnosis not present

## 2020-06-27 DIAGNOSIS — N390 Urinary tract infection, site not specified: Secondary | ICD-10-CM | POA: Diagnosis not present

## 2020-06-27 DIAGNOSIS — N179 Acute kidney failure, unspecified: Secondary | ICD-10-CM | POA: Diagnosis not present

## 2020-06-27 DIAGNOSIS — G9341 Metabolic encephalopathy: Secondary | ICD-10-CM | POA: Diagnosis not present

## 2020-06-28 ENCOUNTER — Telehealth: Payer: Self-pay

## 2020-06-28 ENCOUNTER — Other Ambulatory Visit: Payer: Self-pay

## 2020-06-28 DIAGNOSIS — N138 Other obstructive and reflux uropathy: Secondary | ICD-10-CM

## 2020-06-28 DIAGNOSIS — A4152 Sepsis due to Pseudomonas: Secondary | ICD-10-CM | POA: Diagnosis not present

## 2020-06-28 DIAGNOSIS — N9989 Other postprocedural complications and disorders of genitourinary system: Secondary | ICD-10-CM | POA: Diagnosis not present

## 2020-06-28 DIAGNOSIS — N401 Enlarged prostate with lower urinary tract symptoms: Secondary | ICD-10-CM

## 2020-06-28 DIAGNOSIS — R6520 Severe sepsis without septic shock: Secondary | ICD-10-CM | POA: Diagnosis not present

## 2020-06-28 DIAGNOSIS — N390 Urinary tract infection, site not specified: Secondary | ICD-10-CM

## 2020-06-28 DIAGNOSIS — G9341 Metabolic encephalopathy: Secondary | ICD-10-CM | POA: Diagnosis not present

## 2020-06-28 DIAGNOSIS — N179 Acute kidney failure, unspecified: Secondary | ICD-10-CM | POA: Diagnosis not present

## 2020-06-28 NOTE — Telephone Encounter (Signed)
Received message from [facility] stating patient had completed his antibiotic course and was asking if he should have repeat urine culture. Has periods of burning at end of urination.   Attempted to call Stephane back. No answer. Left message to return call.  I spoke with Dr. Alyson Ingles concerning the concern for this patient- burning at urination has to do with prostate and recently procedure patient had.  Patient also called today and left message to call him back. I called the patient. No answer. Left message to return call.

## 2020-07-02 ENCOUNTER — Telehealth: Payer: Self-pay

## 2020-07-02 ENCOUNTER — Encounter (HOSPITAL_COMMUNITY): Payer: Medicare Other

## 2020-07-02 ENCOUNTER — Other Ambulatory Visit: Payer: Self-pay | Admitting: Family

## 2020-07-02 DIAGNOSIS — Z961 Presence of intraocular lens: Secondary | ICD-10-CM | POA: Diagnosis not present

## 2020-07-02 DIAGNOSIS — H5203 Hypermetropia, bilateral: Secondary | ICD-10-CM | POA: Diagnosis not present

## 2020-07-02 DIAGNOSIS — R609 Edema, unspecified: Secondary | ICD-10-CM

## 2020-07-02 DIAGNOSIS — H524 Presbyopia: Secondary | ICD-10-CM | POA: Diagnosis not present

## 2020-07-02 DIAGNOSIS — H26492 Other secondary cataract, left eye: Secondary | ICD-10-CM | POA: Diagnosis not present

## 2020-07-02 DIAGNOSIS — E119 Type 2 diabetes mellitus without complications: Secondary | ICD-10-CM | POA: Diagnosis not present

## 2020-07-02 DIAGNOSIS — I509 Heart failure, unspecified: Secondary | ICD-10-CM

## 2020-07-02 DIAGNOSIS — H31003 Unspecified chorioretinal scars, bilateral: Secondary | ICD-10-CM | POA: Diagnosis not present

## 2020-07-02 DIAGNOSIS — H52223 Regular astigmatism, bilateral: Secondary | ICD-10-CM | POA: Diagnosis not present

## 2020-07-02 DIAGNOSIS — H35363 Drusen (degenerative) of macula, bilateral: Secondary | ICD-10-CM | POA: Diagnosis not present

## 2020-07-02 LAB — HM DIABETES EYE EXAM

## 2020-07-02 NOTE — Telephone Encounter (Signed)
Pt called saying he had passed blood clots yesterday and this morning also. He had surgery end of February and I spoke with Dr. Alyson Ingles who said that was normal. Pt notified.

## 2020-07-03 DIAGNOSIS — N179 Acute kidney failure, unspecified: Secondary | ICD-10-CM | POA: Diagnosis not present

## 2020-07-03 DIAGNOSIS — R6520 Severe sepsis without septic shock: Secondary | ICD-10-CM | POA: Diagnosis not present

## 2020-07-03 DIAGNOSIS — N390 Urinary tract infection, site not specified: Secondary | ICD-10-CM | POA: Diagnosis not present

## 2020-07-03 DIAGNOSIS — G9341 Metabolic encephalopathy: Secondary | ICD-10-CM | POA: Diagnosis not present

## 2020-07-03 DIAGNOSIS — A4152 Sepsis due to Pseudomonas: Secondary | ICD-10-CM | POA: Diagnosis not present

## 2020-07-03 DIAGNOSIS — N9989 Other postprocedural complications and disorders of genitourinary system: Secondary | ICD-10-CM | POA: Diagnosis not present

## 2020-07-05 DIAGNOSIS — A4152 Sepsis due to Pseudomonas: Secondary | ICD-10-CM | POA: Diagnosis not present

## 2020-07-05 DIAGNOSIS — G9341 Metabolic encephalopathy: Secondary | ICD-10-CM | POA: Diagnosis not present

## 2020-07-05 DIAGNOSIS — N9989 Other postprocedural complications and disorders of genitourinary system: Secondary | ICD-10-CM | POA: Diagnosis not present

## 2020-07-05 DIAGNOSIS — N179 Acute kidney failure, unspecified: Secondary | ICD-10-CM | POA: Diagnosis not present

## 2020-07-05 DIAGNOSIS — N390 Urinary tract infection, site not specified: Secondary | ICD-10-CM | POA: Diagnosis not present

## 2020-07-05 DIAGNOSIS — R6520 Severe sepsis without septic shock: Secondary | ICD-10-CM | POA: Diagnosis not present

## 2020-07-06 ENCOUNTER — Telehealth: Payer: Self-pay

## 2020-07-06 ENCOUNTER — Other Ambulatory Visit: Payer: Self-pay

## 2020-07-06 ENCOUNTER — Other Ambulatory Visit: Payer: Self-pay | Admitting: Nurse Practitioner

## 2020-07-06 DIAGNOSIS — N390 Urinary tract infection, site not specified: Secondary | ICD-10-CM

## 2020-07-06 MED ORDER — NITROFURANTOIN MONOHYD MACRO 100 MG PO CAPS: 100.0000 mg | ORAL_CAPSULE | Freq: Two times a day (BID) | ORAL | 0 refills | Status: DC

## 2020-07-06 NOTE — Telephone Encounter (Signed)
Pt called saying his [facility] had taken a urine specimen and called him saying he did have an infection. I called HHN and got infection information and spoke with Dr. Alyson Ingles. He ordered Macrobid. Ordered med. Called [facility] back and notified her. She stated she would call pt and tell him.

## 2020-07-08 DIAGNOSIS — I110 Hypertensive heart disease with heart failure: Secondary | ICD-10-CM | POA: Diagnosis not present

## 2020-07-08 DIAGNOSIS — A4152 Sepsis due to Pseudomonas: Secondary | ICD-10-CM | POA: Diagnosis not present

## 2020-07-08 DIAGNOSIS — J449 Chronic obstructive pulmonary disease, unspecified: Secondary | ICD-10-CM | POA: Diagnosis not present

## 2020-07-08 DIAGNOSIS — I509 Heart failure, unspecified: Secondary | ICD-10-CM | POA: Diagnosis not present

## 2020-07-08 DIAGNOSIS — I739 Peripheral vascular disease, unspecified: Secondary | ICD-10-CM | POA: Diagnosis not present

## 2020-07-08 DIAGNOSIS — N401 Enlarged prostate with lower urinary tract symptoms: Secondary | ICD-10-CM | POA: Diagnosis not present

## 2020-07-08 DIAGNOSIS — J9611 Chronic respiratory failure with hypoxia: Secondary | ICD-10-CM | POA: Diagnosis not present

## 2020-07-08 DIAGNOSIS — K831 Obstruction of bile duct: Secondary | ICD-10-CM | POA: Diagnosis not present

## 2020-07-08 DIAGNOSIS — N390 Urinary tract infection, site not specified: Secondary | ICD-10-CM | POA: Diagnosis not present

## 2020-07-08 DIAGNOSIS — K219 Gastro-esophageal reflux disease without esophagitis: Secondary | ICD-10-CM | POA: Diagnosis not present

## 2020-07-08 DIAGNOSIS — E1165 Type 2 diabetes mellitus with hyperglycemia: Secondary | ICD-10-CM | POA: Diagnosis not present

## 2020-07-08 DIAGNOSIS — K449 Diaphragmatic hernia without obstruction or gangrene: Secondary | ICD-10-CM | POA: Diagnosis not present

## 2020-07-08 DIAGNOSIS — G4733 Obstructive sleep apnea (adult) (pediatric): Secondary | ICD-10-CM | POA: Diagnosis not present

## 2020-07-08 DIAGNOSIS — G894 Chronic pain syndrome: Secondary | ICD-10-CM | POA: Diagnosis not present

## 2020-07-08 DIAGNOSIS — E1151 Type 2 diabetes mellitus with diabetic peripheral angiopathy without gangrene: Secondary | ICD-10-CM | POA: Diagnosis not present

## 2020-07-08 DIAGNOSIS — I071 Rheumatic tricuspid insufficiency: Secondary | ICD-10-CM | POA: Diagnosis not present

## 2020-07-08 DIAGNOSIS — R338 Other retention of urine: Secondary | ICD-10-CM | POA: Diagnosis not present

## 2020-07-09 ENCOUNTER — Other Ambulatory Visit: Payer: Self-pay | Admitting: Family

## 2020-07-09 ENCOUNTER — Other Ambulatory Visit: Payer: Self-pay

## 2020-07-10 ENCOUNTER — Other Ambulatory Visit: Payer: Self-pay

## 2020-07-10 ENCOUNTER — Telehealth: Payer: Self-pay

## 2020-07-10 DIAGNOSIS — E1165 Type 2 diabetes mellitus with hyperglycemia: Secondary | ICD-10-CM | POA: Diagnosis not present

## 2020-07-10 DIAGNOSIS — R319 Hematuria, unspecified: Secondary | ICD-10-CM

## 2020-07-10 DIAGNOSIS — N401 Enlarged prostate with lower urinary tract symptoms: Secondary | ICD-10-CM | POA: Diagnosis not present

## 2020-07-10 DIAGNOSIS — I509 Heart failure, unspecified: Secondary | ICD-10-CM | POA: Diagnosis not present

## 2020-07-10 DIAGNOSIS — J9611 Chronic respiratory failure with hypoxia: Secondary | ICD-10-CM | POA: Diagnosis not present

## 2020-07-10 DIAGNOSIS — I110 Hypertensive heart disease with heart failure: Secondary | ICD-10-CM | POA: Diagnosis not present

## 2020-07-10 DIAGNOSIS — J449 Chronic obstructive pulmonary disease, unspecified: Secondary | ICD-10-CM | POA: Diagnosis not present

## 2020-07-10 DIAGNOSIS — N390 Urinary tract infection, site not specified: Secondary | ICD-10-CM

## 2020-07-10 MED ORDER — CEFPODOXIME PROXETIL 200 MG PO TABS: 200.0000 mg | ORAL_TABLET | Freq: Two times a day (BID) | ORAL | 7 refills | Status: DC

## 2020-07-10 NOTE — Telephone Encounter (Signed)
Pt called saying he started running a fever this afternoon. Dr. Alyson Ingles ordered Cipro at first but due to a warning we had to change antibiotic to [facility]. Doctor wanted this taken with the [place] all ready taking. Pt was called and explained this. Pt expressed understanding.

## 2020-07-13 ENCOUNTER — Telehealth: Payer: Self-pay

## 2020-07-13 ENCOUNTER — Inpatient Hospital Stay (HOSPITAL_COMMUNITY): Payer: Medicare Other | Attending: Hematology

## 2020-07-13 ENCOUNTER — Ambulatory Visit (INDEPENDENT_AMBULATORY_CARE_PROVIDER_SITE_OTHER): Payer: Medicare Other | Admitting: Urology

## 2020-07-13 ENCOUNTER — Encounter: Payer: Self-pay | Admitting: Urology

## 2020-07-13 ENCOUNTER — Other Ambulatory Visit: Payer: Self-pay

## 2020-07-13 VITALS — BP 114/62 | HR 91 | Temp 98.1°F | Resp 16 | Ht 66.5 in | Wt 171.0 lb

## 2020-07-13 DIAGNOSIS — Z452 Encounter for adjustment and management of vascular access device: Secondary | ICD-10-CM | POA: Diagnosis not present

## 2020-07-13 DIAGNOSIS — N401 Enlarged prostate with lower urinary tract symptoms: Secondary | ICD-10-CM

## 2020-07-13 DIAGNOSIS — Z85118 Personal history of other malignant neoplasm of bronchus and lung: Secondary | ICD-10-CM | POA: Insufficient documentation

## 2020-07-13 DIAGNOSIS — N138 Other obstructive and reflux uropathy: Secondary | ICD-10-CM

## 2020-07-13 DIAGNOSIS — N390 Urinary tract infection, site not specified: Secondary | ICD-10-CM

## 2020-07-13 DIAGNOSIS — R339 Retention of urine, unspecified: Secondary | ICD-10-CM

## 2020-07-13 DIAGNOSIS — R319 Hematuria, unspecified: Secondary | ICD-10-CM

## 2020-07-13 LAB — MICROSCOPIC EXAMINATION
Renal Epithel, UA: NONE SEEN /hpf
WBC, UA: >30 /hpf — AB (ref 0–5)

## 2020-07-13 LAB — URINALYSIS, ROUTINE W REFLEX MICROSCOPIC
Bilirubin, UA: NEGATIVE
Glucose, UA: NEGATIVE
Nitrite, UA: NEGATIVE
Specific Gravity, UA: 1.02 (ref 1.005–1.030)
Urobilinogen, Ur: 0.2 mg/dL (ref 0.2–1.0)
pH, UA: 5.5 (ref 5.0–7.5)

## 2020-07-13 LAB — BLADDER SCAN AMB NON-IMAGING: Scan Result: 22

## 2020-07-13 MED ORDER — HEPARIN SOD (PORK) LOCK FLUSH 100 UNIT/ML IV SOLN
500.0000 [IU] | Freq: Once | INTRAVENOUS | Status: AC
  Administered 2020-07-13: 500 [IU] via INTRAVENOUS

## 2020-07-13 MED ORDER — PHENAZOPYRIDINE HCL 100 MG PO TABS: 100.0000 mg | ORAL_TABLET | Freq: Three times a day (TID) | ORAL | 0 refills | Status: DC | PRN

## 2020-07-13 MED ORDER — SODIUM CHLORIDE 0.9% FLUSH
10.0000 mL | INTRAVENOUS | Status: DC | PRN
  Administered 2020-07-13: 10 mL via INTRAVENOUS

## 2020-07-13 NOTE — Progress Notes (Signed)
Patients port flushed without difficulty.  Good blood return noted with no bruising or swelling noted at site.  Band aid applied.  VSS with discharge and left in satisfactory condition with no s/s of distress noted.   

## 2020-07-13 NOTE — Progress Notes (Signed)
post void residual=22

## 2020-07-13 NOTE — Progress Notes (Signed)
Urological Symptom Review  Patient is experiencing the following symptoms: Burning/pain with urination Leakage of urine Trouble starting stream Urinary tract infection   Review of Systems  Gastrointestinal (upper)  : Nausea  Gastrointestinal (lower) : Negative for lower GI symptoms  Constitutional : Fever Fatigue  Skin: Itching  Eyes: Blurred vision  Ear/Nose/Throat : Negative for Ear/Nose/Throat symptoms  Hematologic/Lymphatic: Easy bruising  Cardiovascular : Leg swelling  Respiratory : Cough Shortness of breath  Endocrine: Excessive thirst  Musculoskeletal: Back pain  Neurological: Negative for neurological symptoms  Psychologic: Negative for psychiatric symptoms

## 2020-07-13 NOTE — Progress Notes (Signed)
07/13/2020 10:34 AM   John [name] 1946-03-05 [id number]  Referring provider: [name], [address] [place],  [place] [address]  Chief Complaint  Patient presents with  . Follow-up    HPI: John [name] is a 75yo here for followup for BPH with urinary retention. He has had 4 UTIs since his surgery. He is currently on Vantin 274m for a UTI. He developed dysuria yesterday. He finished macrobid yesterday. PVR 22cc. NO other complaints today. No fevers    PMH: Past Medical History:  Diagnosis Date  . Adenocarcinoma of lung, right ([facility] 04/18/2016  . Anxiety   . Arthritis   . Asthma   . BPH (benign prostatic hyperplasia)    with urinary retention 02/06/20  . CHF (congestive heart failure) ([facility]   . COPD (chronic obstructive pulmonary disease) ([facility]   . Depression   . Diabetes mellitus without complication (HCC)    no meds  . Diabetes mellitus, type II ([facility]   . DM (diabetes mellitus) ([facility] 07/09/2016  . Dyspnea   . History of kidney stones   . Hyperlipidemia   . Hypertension   . Hypertension   . Hypothyroidism   . Macular degeneration   . Neuropathy   . Non-small cell lung cancer, right ([facility] 04/18/2016  . Peripheral vascular disease ([facility]   . Prostatitis   . Pulmonary nodule, left 07/16/2016  . Sleep apnea    cpap    Surgical History: Past Surgical History:  Procedure Laterality Date  . ABDOMINAL AORTOGRAM W/LOWER EXTREMITY Left 02/06/2020   Procedure: ABDOMINAL AORTOGRAM W/LOWER EXTREMITY;  Surgeon: BLorretta Harp MD;  [facility];  Service: Cardiovascular;  Laterality: Left;  . CATARACT EXTRACTION, BILATERAL Bilateral   . NO PAST SURGERIES    . PORTACATH PLACEMENT Left 06/13/2016   Procedure: INSERTION PORT-A-CATH;  Surgeon: MAviva Signs MD;  Location: [facility];  Service: General;  Laterality: Left;  . TRANSURETHRAL RESECTION OF PROSTATE N/A 05/31/2020   Procedure: TRANSURETHRAL RESECTION OF THE PROSTATE (TURP);  Surgeon:  MCleon Gustin MD;  Location: [facility];  Service: Urology;  Laterality: N/A;  . VIDEO BRONCHOSCOPY WITH ENDOBRONCHIAL NAVIGATION N/A 05/28/2016   Procedure: VIDEO BRONCHOSCOPY WITH ENDOBRONCHIAL NAVIGATION;  Surgeon: SMelrose Nakayama MD;  [facility]  Service: Thoracic;  Laterality: N/A;  . VIDEO BRONCHOSCOPY WITH ENDOBRONCHIAL ULTRASOUND N/A 05/28/2016   Procedure: VIDEO BRONCHOSCOPY WITH ENDOBRONCHIAL ULTRASOUND;  Surgeon: SMelrose Nakayama MD;  Location: [facility]  Service: Thoracic;  Laterality: N/A;    Home Medications:  Allergies as of 07/13/2020      Reactions   Jardiance [empagliflozin] Other (See Comments)   FEELS SLUGGISH, TIRED   Lopressor [metoprolol] Other (See Comments)   Fatigue   Sulfa Antibiotics Swelling   Mouth swelling      Medication List       Accurate as of July 13, 2020 10:34 AM. If you have any questions, ask your nurse or doctor.        acetaminophen 500 MG tablet Commonly known as: TYLENOL Take 1,000 mg by mouth at bedtime as needed for moderate pain.   albuterol 108 (90 Base) MCG/ACT inhaler Commonly known as: VENTOLIN HFA Inhale 2 puffs into the lungs every 4 (four) hours as needed for wheezing or shortness of breath.   aspirin EC 81 MG tablet Take 81 mg by mouth daily.   bacitracin 500 UNIT/GM ointment Apply 1 application topically 2 (two) times daily.   cefpodoxime 200 MG tablet  Commonly known as: VANTIN Take 1 tablet (200 mg total) by mouth 2 (two) times daily.   cloNIDine 0.1 MG tablet Commonly known as: CATAPRES TAKE 1 TABLET (0.1 MG TOTAL) BY MOUTH 3 (THREE) TIMES DAILY.   clotrimazole-betamethasone cream Commonly known as: Lotrisone Apply 1 application topically 2 (two) times daily.   dutasteride 0.5 MG capsule Commonly known as: AVODART TAKE 1 CAPSULE BY MOUTH EVERY DAY What changed: how much to take   finasteride 5 MG tablet Commonly known as: PROSCAR Take 5 mg by mouth daily.   fluticasone 50 MCG/ACT nasal  spray Commonly known as: FLONASE Place 1 spray into both nostrils daily as needed for allergies or rhinitis.   furosemide 20 MG tablet Commonly known as: LASIX TAKE 1 TABLET BY MOUTH TWICE A DAY   gabapentin 300 MG capsule Commonly known as: NEURONTIN Take 1 capsule (300 mg total) by mouth 3 (three) times daily. What changed:   how much to take  when to take this  additional instructions   HYDROcodone-acetaminophen 5-325 MG tablet Commonly known as: Norco Take 1 tablet by mouth every 6 (six) hours as needed for moderate pain.   levothyroxine 50 MCG tablet Commonly known as: SYNTHROID Take 1 tablet (50 mcg total) by mouth daily.   linaclotide 72 MCG capsule Commonly known as: Linzess Take 1 capsule (72 mcg total) by mouth daily before breakfast.   losartan 50 MG tablet Commonly known as: COZAAR Take 1 tablet (50 mg total) by mouth daily.   meloxicam 7.5 MG tablet Commonly known as: MOBIC Take 1 tablet (7.5 mg total) by mouth daily as needed for pain (back pain).   metFORMIN 500 MG 24 hr tablet Commonly known as: GLUCOPHAGE-XR Take 1 tablet (500 mg total) by mouth daily with breakfast.   metoprolol succinate 25 MG 24 hr tablet Commonly known as: TOPROL-XL Take 1.5 tablets (37.5 mg total) by mouth daily. Take with or immediately following a meal.   nitrofurantoin (macrocrystal-monohydrate) 100 MG capsule Commonly known as: MACROBID Take 1 capsule (100 mg total) by mouth every 12 (twelve) hours.   nystatin 100000 UNIT/ML suspension Commonly known as: MYCOSTATIN Take 5 mLs (500,000 Units total) by mouth 4 (four) times daily.   ondansetron 4 MG disintegrating tablet Commonly known as: Zofran ODT Take 1 tablet (4 mg total) by mouth every 8 (eight) hours as needed for nausea or vomiting.   OneTouch Delica Lancets 27O Misc Use to test blood sugars daily as directed. DX: E11.9   OneTouch Verio Reflect w/Device Kit Use to test blood sugars daily as directed. DX:  E11.9   OneTouch Verio test strip Generic drug: glucose blood Use to test blood sugars daily as directed. DX: E11.9   polyethylene glycol 17 g packet Commonly known as: MIRALAX / GLYCOLAX Take 17 g by mouth daily as needed for moderate constipation or severe constipation.   pravastatin 40 MG tablet Commonly known as: PRAVACHOL TAKE 1 TABLET (40 MG TOTAL) BY MOUTH ON MONDAYS , WEDNESDAYS, AND FRIDAYS AS DIRECTED What changed: See the new instructions.   Rybelsus 7 MG Tabs Generic drug: Semaglutide Take 7 mg by mouth daily.   Trelegy Ellipta 100-62.5-25 MCG/INH Aepb Generic drug: Fluticasone-Umeclidin-Vilant Inhale 1 puff into the lungs daily.   zolpidem 5 MG tablet Commonly known as: AMBIEN Take 1 tablet (5 mg total) by mouth at bedtime as needed for sleep.       Allergies:  Allergies  Allergen Reactions  . Jardiance [Empagliflozin] Other (See Comments)  FEELS SLUGGISH, TIRED  . Lopressor [Metoprolol] Other (See Comments)    Fatigue  . Sulfa Antibiotics Swelling    Mouth swelling    Family History: Family History  Problem Relation Age of Onset  . Hypertension Mother   . Diabetes Father   . Heart disease Father   . Stroke Father   . Hypertension Sister     Social History:  reports that he has been smoking cigarettes. He started smoking about 59 years ago. He has a 27.50 pack-year smoking history. He has never used smokeless tobacco. He reports that he does not drink alcohol and does not use drugs.  ROS: All other review of systems were reviewed and are negative except what is noted above in HPI  Physical Exam: BP 114/62   Pulse 91   Temp 98.1 F (36.7 C) (Oral)   Resp 16   Ht 5' 6.5" (1.689 m)   Wt 171 lb (77.6 kg)   BMI 27.19 kg/m   Constitutional:  Alert and oriented, No acute distress. HEENT: [place] AT, moist mucus membranes.  Trachea midline, no masses. Cardiovascular: No clubbing, cyanosis, or edema. Respiratory: Normal respiratory effort, no  increased work of breathing. GI: Abdomen is soft, nontender, nondistended, no abdominal masses GU: No CVA tenderness.  Lymph: No cervical or inguinal lymphadenopathy. Skin: No rashes, bruises or suspicious lesions. Neurologic: Grossly intact, no focal deficits, moving all 4 extremities. Psychiatric: Normal mood and affect.  Laboratory Data: Lab Results  Component Value Date   WBC 11.0 (H) 06/12/2020   HGB 10.4 (L) 06/12/2020   HCT 31.3 (L) 06/12/2020   MCV 93 06/12/2020   PLT 418 06/12/2020    Lab Results  Component Value Date   CREATININE 0.94 06/14/2020    No results found for: PSA  No results found for: TESTOSTERONE  Lab Results  Component Value Date   HGBA1C 6.7 (H) 05/06/2020    Urinalysis    Component Value Date/Time   COLORURINE AMBER (A) 06/02/2020 0729   APPEARANCEUR Clear 06/15/2020 1207   LABSPEC 1.016 06/02/2020 0729   PHURINE 6.0 06/02/2020 0729   GLUCOSEU Negative 06/15/2020 1207   HGBUR LARGE (A) 06/02/2020 0729   BILIRUBINUR Negative 06/15/2020 [place] 06/02/2020 0729   PROTEINUR Negative 06/15/2020 1207   PROTEINUR 100 (A) 06/02/2020 0729   NITRITE Negative 06/15/2020 1207   NITRITE NEGATIVE 06/02/2020 0729   LEUKOCYTESUR 1+ (A) 06/15/2020 1207   LEUKOCYTESUR NEGATIVE 06/02/2020 0729    Lab Results  Component Value Date   LABMICR See below: 06/15/2020   WBCUA 6-10 (A) 06/15/2020   LABEPIT 0-10 06/15/2020   BACTERIA Few (A) 06/15/2020    Pertinent Imaging:  Results for orders placed in visit on 12/30/19  DG Abd 1 View  Narrative CLINICAL DATA:  Abdominal pain  EXAM: ABDOMEN - 1 VIEW  COMPARISON:  06/05/2017  FINDINGS: Scattered large and small bowel gas is noted. Mild retained fecal material is seen without obstructive change. Degenerative change of the lumbar spine is noted. No abnormal calcifications are seen.  IMPRESSION: Mild retained fecal material.   Electronically Signed By: Inez Catalina  M.D. On: 01/01/2020 15:15  Results for orders placed during the hospital encounter of 08/28/16  US Venous Img Lower Bilateral  Narrative CLINICAL DATA:  Bilateral leg swelling.  Treatment for lung cancer.  EXAM: BILATERAL LOWER EXTREMITY VENOUS DOPPLER ULTRASOUND  TECHNIQUE: Gray-scale sonography with graded compression, as well as color Doppler and duplex ultrasound were performed to evaluate the  lower extremity deep venous systems from the level of the common femoral vein and including the common femoral, femoral, profunda femoral, popliteal and calf veins including the posterior tibial, peroneal and gastrocnemius veins when visible. The superficial great saphenous vein was also interrogated. Spectral Doppler was utilized to evaluate flow at rest and with distal augmentation maneuvers in the common femoral, femoral and popliteal veins.  COMPARISON:  None.  FINDINGS: RIGHT LOWER EXTREMITY  Common Femoral Vein: No evidence of thrombus. Normal compressibility, respiratory phasicity and response to augmentation.  Saphenofemoral Junction: No evidence of thrombus. Normal compressibility and flow on color Doppler imaging.  Profunda Femoral Vein: No evidence of thrombus. Normal compressibility and flow on color Doppler imaging.  Femoral Vein: No evidence of thrombus. Normal compressibility, respiratory phasicity and response to augmentation.  Popliteal Vein: No evidence of thrombus. Normal compressibility, respiratory phasicity and response to augmentation.  Calf Veins: No evidence of thrombus. Normal compressibility and flow on color Doppler imaging.  Superficial Great Saphenous Vein: No evidence of thrombus. Normal compressibility and flow on color Doppler imaging.  Venous Reflux:  None.  Other Findings:  None.  LEFT LOWER EXTREMITY  Common Femoral Vein: No evidence of thrombus. Normal compressibility, respiratory phasicity and response to  augmentation.  Saphenofemoral Junction: No evidence of thrombus. Normal compressibility and flow on color Doppler imaging.  Profunda Femoral Vein: No evidence of thrombus. Normal compressibility and flow on color Doppler imaging.  Femoral Vein: No evidence of thrombus. Normal compressibility, respiratory phasicity and response to augmentation.  Popliteal Vein: No evidence of thrombus. Normal compressibility, respiratory phasicity and response to augmentation.  Calf Veins: No evidence of thrombus. Normal compressibility and flow on color Doppler imaging.  Superficial Great Saphenous Vein: No evidence of thrombus. Normal compressibility and flow on color Doppler imaging.  Venous Reflux:  None.  Other Findings:  None.  IMPRESSION: No evidence of DVT within either lower extremity.   Electronically Signed By: Franchot Gallo M.D. On: 08/28/2016 14:17  No results found for this or any previous visit.  No results found for this or any previous visit.  No results found for this or any previous visit.  No results found for this or any previous visit.  No results found for this or any previous visit.  No results found for this or any previous visit.   Assessment & Plan:    1. BPH with urinary obstruction -improved after TURP  2. Urinary tract infection with hematuria, site unspecified -continue vantin, pyridium 147m TID PRN  3. Urinary retention -resolved   No follow-ups on file.  PNicolette Bang MD  [facility] [place]

## 2020-07-13 NOTE — Patient Instructions (Signed)
[place] at [facility] Discharge Instructions  You were here today for port flush.  Return as scheduled.   Thank you for choosing [place] at [facility] to provide your oncology and hematology care.  To afford each patient quality time with our provider, please arrive at least 15 minutes before your scheduled appointment time.   If you have a lab appointment with the [facility] please come in thru the Main Entrance and check in at the main information desk.  You need to re-schedule your appointment should you arrive 10 or more minutes late.  We strive to give you quality time with our providers, and arriving late affects you and other patients whose appointments are after yours.  Also, if you no show three or more times for appointments you may be dismissed from the clinic at the providers discretion.     Again, thank you for choosing [facility].  Our hope is that these requests will decrease the amount of time that you wait before being seen by our physicians.       _____________________________________________________________  Should you have questions after your visit to [facility], please contact our office at [phone] and follow the prompts.  Our office hours are 8:00 a.m. and 4:30 p.m. Monday - Friday.  Please note that voicemails left after 4:00 p.m. may not be returned until the following business day.  We are closed weekends and major holidays.  You do have access to a nurse 24-7, just call the main number to the clinic [phone] and do not press any options, hold on the line and a nurse will answer the phone.    For prescription refill requests, have your pharmacy contact our office and allow 72 hours.    Due to Covid, you will need to wear a mask upon entering the hospital. If you do not have a mask, a mask will be given to you at the Main Entrance upon arrival. For doctor visits, patients may have  1 support person age 43 or older with them. For treatment visits, patients can not have anyone with them due to social distancing guidelines and our immunocompromised population.

## 2020-07-13 NOTE — Patient Instructions (Signed)
Urinary Tract Infection, Adult  A urinary tract infection (UTI) is an infection of any part of the urinary tract. The urinary tract includes the kidneys, ureters, bladder, and urethra. These organs make, store, and get rid of urine in the body. An upper UTI affects the ureters and kidneys. A lower UTI affects the bladder and urethra. What are the causes? Most urinary tract infections are caused by bacteria in your genital area around your urethra, where urine leaves your body. These bacteria grow and cause inflammation of your urinary tract. What increases the risk? You are more likely to develop this condition if:  You have a urinary catheter that stays in place.  You are not able to control when you urinate or have a bowel movement (incontinence).  You are male and you: ? Use a spermicide or diaphragm for birth control. ? Have low estrogen levels. ? Are pregnant.  You have certain genes that increase your risk.  You are sexually active.  You take antibiotic medicines.  You have a condition that causes your flow of urine to slow down, such as: ? An enlarged prostate, if you are male. ? Blockage in your urethra. ? A kidney stone. ? A nerve condition that affects your bladder control (neurogenic bladder). ? Not getting enough to drink, or not urinating often.  You have certain medical conditions, such as: ? Diabetes. ? A weak disease-fighting system (immunesystem). ? Sickle cell disease. ? Gout. ? Spinal cord injury. What are the signs or symptoms? Symptoms of this condition include:  Needing to urinate right away (urgency).  Frequent urination. This may include small amounts of urine each time you urinate.  Pain or burning with urination.  Blood in the urine.  Urine that smells bad or unusual.  Trouble urinating.  Cloudy urine.  Vaginal discharge, if you are male.  Pain in the abdomen or the lower back. You may also have:  Vomiting or a decreased  appetite.  Confusion.  Irritability or tiredness.  A fever or chills.  Diarrhea. The first symptom in older adults may be confusion. In some cases, they may not have any symptoms until the infection has worsened. How is this diagnosed? This condition is diagnosed based on your medical history and a physical exam. You may also have other tests, including:  Urine tests.  Blood tests.  Tests for STIs (sexually transmitted infections). If you have had more than one UTI, a cystoscopy or imaging studies may be done to determine the cause of the infections. How is this treated? Treatment for this condition includes:  Antibiotic medicine.  Over-the-counter medicines to treat discomfort.  Drinking enough water to stay hydrated. If you have frequent infections or have other conditions such as a kidney stone, you may need to see a health care provider who specializes in the urinary tract (urologist). In rare cases, urinary tract infections can cause sepsis. Sepsis is a life-threatening condition that occurs when the body responds to an infection. Sepsis is treated in the hospital with IV antibiotics, fluids, and other medicines. Follow these instructions at home: Medicines  Take over-the-counter and prescription medicines only as told by your health care provider.  If you were prescribed an antibiotic medicine, take it as told by your health care provider. Do not stop using the antibiotic even if you start to feel better. General instructions  Make sure you: ? Empty your bladder often and completely. Do not hold urine for long periods of time. ? Empty your bladder after   sex. ? Wipe from front to back after urinating or having a bowel movement if you are male. Use each tissue only one time when you wipe.  Drink enough fluid to keep your urine pale yellow.  Keep all follow-up visits. This is important.   Contact a health care provider if:  Your symptoms do not get better after 1-2  days.  Your symptoms go away and then return. Get help right away if:  You have severe pain in your back or your lower abdomen.  You have a fever or chills.  You have nausea or vomiting. Summary  A urinary tract infection (UTI) is an infection of any part of the urinary tract, which includes the kidneys, ureters, bladder, and urethra.  Most urinary tract infections are caused by bacteria in your genital area.  Treatment for this condition often includes antibiotic medicines.  If you were prescribed an antibiotic medicine, take it as told by your health care provider. Do not stop using the antibiotic even if you start to feel better.  Keep all follow-up visits. This is important. This information is not intended to replace advice given to you by your health care provider. Make sure you discuss any questions you have with your health care provider. Document Revised: 11/04/2019 Document Reviewed: 11/04/2019 Elsevier Patient Education  2021 Elsevier Inc.  

## 2020-07-13 NOTE — Telephone Encounter (Signed)
Pt called saying that when he coughs he squirts out urine. I talked to AL, RN and she said that is common. Dr. Alyson Ingles said it will get better with time.

## 2020-07-15 LAB — URINE CULTURE: Organism ID, Bacteria: NO GROWTH

## 2020-07-16 ENCOUNTER — Telehealth: Payer: Self-pay | Admitting: Student

## 2020-07-16 NOTE — Telephone Encounter (Signed)
Pt called stating he's having problems with his breathing. He's not sure if he has fluid around his heart or lungs.   States his pulse is low. He has been in the hospital and was on thyroid medication and they cut it back and now his pulse is to low.   Please call [phone]

## 2020-07-16 NOTE — Telephone Encounter (Signed)
Patient stated that his heart rate has stayed between the 70-90's range since coming home from the hospital. Informed patient that those were normal readings. He states that his hr is usually tachycardiac, so he was concerned. Patient also stated that hospitalist dropped his Levothyroxine from 75 mcg to 50 mcg and he felt this was too low. I stated that patient needed to make a follow up appointment with his PCP for all non cardiac related episodes. Patient verbalized understanding.

## 2020-07-17 ENCOUNTER — Telehealth: Payer: Self-pay

## 2020-07-17 DIAGNOSIS — N401 Enlarged prostate with lower urinary tract symptoms: Secondary | ICD-10-CM | POA: Diagnosis not present

## 2020-07-17 DIAGNOSIS — I110 Hypertensive heart disease with heart failure: Secondary | ICD-10-CM | POA: Diagnosis not present

## 2020-07-17 DIAGNOSIS — L84 Corns and callosities: Secondary | ICD-10-CM | POA: Diagnosis not present

## 2020-07-17 DIAGNOSIS — B351 Tinea unguium: Secondary | ICD-10-CM | POA: Diagnosis not present

## 2020-07-17 DIAGNOSIS — E1165 Type 2 diabetes mellitus with hyperglycemia: Secondary | ICD-10-CM | POA: Diagnosis not present

## 2020-07-17 DIAGNOSIS — J449 Chronic obstructive pulmonary disease, unspecified: Secondary | ICD-10-CM | POA: Diagnosis not present

## 2020-07-17 DIAGNOSIS — J9611 Chronic respiratory failure with hypoxia: Secondary | ICD-10-CM | POA: Diagnosis not present

## 2020-07-17 DIAGNOSIS — E1142 Type 2 diabetes mellitus with diabetic polyneuropathy: Secondary | ICD-10-CM | POA: Diagnosis not present

## 2020-07-17 DIAGNOSIS — M79676 Pain in unspecified toe(s): Secondary | ICD-10-CM | POA: Diagnosis not present

## 2020-07-17 DIAGNOSIS — I509 Heart failure, unspecified: Secondary | ICD-10-CM | POA: Diagnosis not present

## 2020-07-17 NOTE — Telephone Encounter (Signed)
Pt is to be scheduled for a right femoral endarterectomy with retrograde right iliac stent with Dr. Carlis Abbott. Spoke with pt, who previously requested office to follow-up after 07/16/20, one month s/p TURP to schedule. Today, he reports having a urinary catheter from November to March with multiple UTIs and just finished the last course of antibiotics this morning. He has a U/A scheduled on 4/19 and follow-up with urologist on 4/22.   Pt plans to call our office week of 4/25 regarding plans for surgery, as he verbalized ready to have surgery because legs are starting to give out.

## 2020-07-19 ENCOUNTER — Telehealth: Payer: Self-pay

## 2020-07-23 ENCOUNTER — Other Ambulatory Visit: Payer: Self-pay | Admitting: Family

## 2020-07-23 NOTE — Telephone Encounter (Signed)
Last office 06/12/20 Last refill 03/20/20, #20, no refills

## 2020-07-24 ENCOUNTER — Other Ambulatory Visit: Payer: Medicare Other

## 2020-07-24 ENCOUNTER — Other Ambulatory Visit: Payer: Self-pay

## 2020-07-24 ENCOUNTER — Ambulatory Visit (INDEPENDENT_AMBULATORY_CARE_PROVIDER_SITE_OTHER): Payer: Medicare Other

## 2020-07-24 DIAGNOSIS — E1165 Type 2 diabetes mellitus with hyperglycemia: Secondary | ICD-10-CM

## 2020-07-24 DIAGNOSIS — I071 Rheumatic tricuspid insufficiency: Secondary | ICD-10-CM

## 2020-07-24 DIAGNOSIS — K219 Gastro-esophageal reflux disease without esophagitis: Secondary | ICD-10-CM

## 2020-07-24 DIAGNOSIS — I509 Heart failure, unspecified: Secondary | ICD-10-CM | POA: Diagnosis not present

## 2020-07-24 DIAGNOSIS — A4152 Sepsis due to Pseudomonas: Secondary | ICD-10-CM | POA: Diagnosis not present

## 2020-07-24 DIAGNOSIS — K449 Diaphragmatic hernia without obstruction or gangrene: Secondary | ICD-10-CM

## 2020-07-24 DIAGNOSIS — R338 Other retention of urine: Secondary | ICD-10-CM | POA: Diagnosis not present

## 2020-07-24 DIAGNOSIS — I110 Hypertensive heart disease with heart failure: Secondary | ICD-10-CM

## 2020-07-24 DIAGNOSIS — G894 Chronic pain syndrome: Secondary | ICD-10-CM

## 2020-07-24 DIAGNOSIS — J449 Chronic obstructive pulmonary disease, unspecified: Secondary | ICD-10-CM | POA: Diagnosis not present

## 2020-07-24 DIAGNOSIS — J9611 Chronic respiratory failure with hypoxia: Secondary | ICD-10-CM

## 2020-07-24 DIAGNOSIS — E1151 Type 2 diabetes mellitus with diabetic peripheral angiopathy without gangrene: Secondary | ICD-10-CM

## 2020-07-24 DIAGNOSIS — N401 Enlarged prostate with lower urinary tract symptoms: Secondary | ICD-10-CM | POA: Diagnosis not present

## 2020-07-24 DIAGNOSIS — N390 Urinary tract infection, site not specified: Secondary | ICD-10-CM | POA: Diagnosis not present

## 2020-07-24 DIAGNOSIS — G4733 Obstructive sleep apnea (adult) (pediatric): Secondary | ICD-10-CM

## 2020-07-24 DIAGNOSIS — I739 Peripheral vascular disease, unspecified: Secondary | ICD-10-CM

## 2020-07-24 DIAGNOSIS — K831 Obstruction of bile duct: Secondary | ICD-10-CM

## 2020-07-24 LAB — MICROSCOPIC EXAMINATION
Epithelial Cells (non renal): NONE SEEN /hpf (ref 0–10)
WBC, UA: >30 /hpf — ABNORMAL HIGH (ref 0–5)

## 2020-07-24 LAB — URINALYSIS, ROUTINE W REFLEX MICROSCOPIC
Bilirubin, UA: NEGATIVE
Glucose, UA: NEGATIVE
Ketones, UA: NEGATIVE
Nitrite, UA: NEGATIVE
Specific Gravity, UA: >1.03 — ABNORMAL HIGH (ref 1.005–1.030)
Urobilinogen, Ur: 0.2 mg/dL (ref 0.2–1.0)
pH, UA: 6 (ref 5.0–7.5)

## 2020-07-25 ENCOUNTER — Telehealth: Payer: Self-pay

## 2020-07-25 ENCOUNTER — Ambulatory Visit: Payer: Medicare Other | Admitting: Nutrition

## 2020-07-25 DIAGNOSIS — N401 Enlarged prostate with lower urinary tract symptoms: Secondary | ICD-10-CM | POA: Diagnosis not present

## 2020-07-25 DIAGNOSIS — J449 Chronic obstructive pulmonary disease, unspecified: Secondary | ICD-10-CM | POA: Diagnosis not present

## 2020-07-25 DIAGNOSIS — J9611 Chronic respiratory failure with hypoxia: Secondary | ICD-10-CM | POA: Diagnosis not present

## 2020-07-25 DIAGNOSIS — I509 Heart failure, unspecified: Secondary | ICD-10-CM | POA: Diagnosis not present

## 2020-07-25 DIAGNOSIS — I110 Hypertensive heart disease with heart failure: Secondary | ICD-10-CM | POA: Diagnosis not present

## 2020-07-25 DIAGNOSIS — E1165 Type 2 diabetes mellitus with hyperglycemia: Secondary | ICD-10-CM | POA: Diagnosis not present

## 2020-07-25 NOTE — Telephone Encounter (Signed)
Colletta Maryland, nurse from [facility], is reporting that patient is taking Ambien 5 mg nightly and is still unable to sleep.  He would like to have dosage increased if possible.  Please advise.  Aware you are out of the office today.

## 2020-07-26 NOTE — Telephone Encounter (Signed)
I am sorry, but given his age this is the max dose.

## 2020-07-26 NOTE — Telephone Encounter (Signed)
Spoke with [facility] nurse and they would like for him to be changed or try different medication then since it is not working at all please advise

## 2020-07-27 ENCOUNTER — Encounter: Payer: Self-pay | Admitting: Urology

## 2020-07-27 ENCOUNTER — Other Ambulatory Visit: Payer: Self-pay | Admitting: Urology

## 2020-07-27 ENCOUNTER — Ambulatory Visit (INDEPENDENT_AMBULATORY_CARE_PROVIDER_SITE_OTHER): Payer: Medicare Other | Admitting: Family

## 2020-07-27 ENCOUNTER — Ambulatory Visit (INDEPENDENT_AMBULATORY_CARE_PROVIDER_SITE_OTHER): Payer: Medicare Other | Admitting: Urology

## 2020-07-27 ENCOUNTER — Encounter: Payer: Self-pay | Admitting: Family

## 2020-07-27 ENCOUNTER — Other Ambulatory Visit: Payer: Self-pay

## 2020-07-27 ENCOUNTER — Other Ambulatory Visit (HOSPITAL_COMMUNITY): Payer: Self-pay | Admitting: *Deleted

## 2020-07-27 ENCOUNTER — Telehealth: Payer: Self-pay

## 2020-07-27 VITALS — BP 146/71 | HR 87 | Temp 98.1°F | Ht 66.0 in | Wt 169.4 lb

## 2020-07-27 DIAGNOSIS — F5101 Primary insomnia: Secondary | ICD-10-CM | POA: Diagnosis not present

## 2020-07-27 DIAGNOSIS — N138 Other obstructive and reflux uropathy: Secondary | ICD-10-CM

## 2020-07-27 DIAGNOSIS — R300 Dysuria: Secondary | ICD-10-CM | POA: Insufficient documentation

## 2020-07-27 DIAGNOSIS — E039 Hypothyroidism, unspecified: Secondary | ICD-10-CM

## 2020-07-27 DIAGNOSIS — G479 Sleep disorder, unspecified: Secondary | ICD-10-CM

## 2020-07-27 DIAGNOSIS — N401 Enlarged prostate with lower urinary tract symptoms: Secondary | ICD-10-CM

## 2020-07-27 DIAGNOSIS — C3491 Malignant neoplasm of unspecified part of right bronchus or lung: Secondary | ICD-10-CM

## 2020-07-27 DIAGNOSIS — I2510 Atherosclerotic heart disease of native coronary artery without angina pectoris: Secondary | ICD-10-CM

## 2020-07-27 LAB — MICROSCOPIC EXAMINATION
Bacteria, UA: NONE SEEN
Renal Epithel, UA: NONE SEEN /hpf
WBC, UA: >30 /hpf — AB (ref 0–5)

## 2020-07-27 LAB — URINALYSIS, ROUTINE W REFLEX MICROSCOPIC
Bilirubin, UA: NEGATIVE
Glucose, UA: NEGATIVE
Ketones, UA: NEGATIVE
Nitrite, UA: NEGATIVE
Specific Gravity, UA: 1.02 (ref 1.005–1.030)
Urobilinogen, Ur: 0.2 mg/dL (ref 0.2–1.0)
pH, UA: 6 (ref 5.0–7.5)

## 2020-07-27 MED ORDER — TEMAZEPAM 7.5 MG PO CAPS: 7.5000 mg | ORAL_CAPSULE | Freq: Every evening | ORAL | 1 refills | Status: DC | PRN

## 2020-07-27 MED ORDER — URIBEL 118 MG PO CAPS: 1.0000 | ORAL_CAPSULE | Freq: Two times a day (BID) | ORAL | 3 refills | Status: DC | PRN

## 2020-07-27 NOTE — Progress Notes (Signed)
07/27/2020 10:09 AM   John [name] 1945-09-07 [id number]  Referring provider: [name], [address] [place],  [place] [address]  Followup BPH  HPI: John [name] is a 75yo here for followup for BPH. He has a good urinary stream, improved urinary frequency and urgency. He has intermittent dysuria. No recent gross hematuria. No other complaints today.   PMH: Past Medical History:  Diagnosis Date  . Adenocarcinoma of lung, right ([name]) 04/18/2016  . Anxiety   . Arthritis   . Asthma   . BPH (benign prostatic hyperplasia)    with urinary retention 02/06/20  . CHF (congestive heart failure) ([facility] [facility])   . COPD (chronic obstructive pulmonary disease) ([facility])   . Depression   . Diabetes mellitus without complication (HCC)    no meds  . Diabetes mellitus, type II ([place])   . DM (diabetes mellitus) ([place]) 07/09/2016  . Dyspnea   . History of kidney stones   . Hyperlipidemia   . Hypertension   . Hypertension   . Hypothyroidism   . Macular degeneration   . Neuropathy   . Non-small cell lung cancer, right ([name]) 04/18/2016  . Peripheral vascular disease ([facility])   . Prostatitis   . Pulmonary nodule, left 07/16/2016  . Sleep apnea    cpap    Surgical History: Past Surgical History:  Procedure Laterality Date  . ABDOMINAL AORTOGRAM W/LOWER EXTREMITY Left 02/06/2020   Procedure: ABDOMINAL AORTOGRAM W/LOWER EXTREMITY;  Surgeon: Lorretta Harp, MD;  Location: [facility];  Service: Cardiovascular;  Laterality: Left;  . CATARACT EXTRACTION, BILATERAL Bilateral   . NO PAST SURGERIES    . PORTACATH PLACEMENT Left 06/13/2016   Procedure: INSERTION PORT-A-CATH;  Surgeon: Aviva Signs, MD;  Location: [facility];  Service: General;  Laterality: Left;  . TRANSURETHRAL RESECTION OF PROSTATE N/A 05/31/2020   Procedure: TRANSURETHRAL RESECTION OF THE PROSTATE (TURP);  Surgeon: Cleon Gustin, MD;  Location: [facility];  Service: Urology;  Laterality: N/A;  . VIDEO BRONCHOSCOPY WITH  ENDOBRONCHIAL NAVIGATION N/A 05/28/2016   Procedure: VIDEO BRONCHOSCOPY WITH ENDOBRONCHIAL NAVIGATION;  Surgeon: [name], MD;  Location: [facility];  Service: Thoracic;  Laterality: N/A;  . VIDEO BRONCHOSCOPY WITH ENDOBRONCHIAL ULTRASOUND N/A 05/28/2016   Procedure: VIDEO BRONCHOSCOPY WITH ENDOBRONCHIAL ULTRASOUND;  Surgeon: [name], MD;  Location: [facility];  Service: Thoracic;  Laterality: N/A;    Home Medications:  Allergies as of 07/27/2020      Reactions   Jardiance [empagliflozin] Other (See Comments)   FEELS SLUGGISH, TIRED   Lopressor [metoprolol] Other (See Comments)   Fatigue   Sulfa Antibiotics Swelling   Mouth swelling      Medication List       Accurate as of July 27, 2020 10:09 AM. If you have any questions, ask your nurse or doctor.        acetaminophen 500 MG tablet Commonly known as: TYLENOL Take 1,000 mg by mouth at bedtime as needed for moderate pain.   albuterol 108 (90 Base) MCG/ACT inhaler Commonly known as: VENTOLIN HFA Inhale 2 puffs into the lungs every 4 (four) hours as needed for wheezing or shortness of breath.   aspirin EC 81 MG tablet Take 81 mg by mouth daily.   bacitracin 500 UNIT/GM ointment Apply 1 application topically 2 (two) times daily.   cefpodoxime 200 MG tablet Commonly known as: VANTIN Take 1 tablet (200 mg total) by mouth 2 (two) times daily.   cloNIDine 0.1 MG tablet Commonly  known as: CATAPRES TAKE 1 TABLET (0.1 MG TOTAL) BY MOUTH 3 (THREE) TIMES DAILY.   clotrimazole-betamethasone cream Commonly known as: Lotrisone Apply 1 application topically 2 (two) times daily.   dutasteride 0.5 MG capsule Commonly known as: AVODART TAKE 1 CAPSULE BY MOUTH EVERY DAY What changed: how much to take   finasteride 5 MG tablet Commonly known as: PROSCAR Take 5 mg by mouth daily.   fluticasone 50 MCG/ACT nasal spray Commonly known as: FLONASE Place 1 spray into both nostrils daily as needed for allergies or  rhinitis.   furosemide 20 MG tablet Commonly known as: LASIX TAKE 1 TABLET BY MOUTH TWICE A DAY   gabapentin 300 MG capsule Commonly known as: NEURONTIN Take 1 capsule (300 mg total) by mouth 3 (three) times daily. What changed:   how much to take  when to take this  additional instructions   HYDROcodone-acetaminophen 5-325 MG tablet Commonly known as: Norco Take 1 tablet by mouth every 6 (six) hours as needed for moderate pain.   levothyroxine 50 MCG tablet Commonly known as: SYNTHROID Take 1 tablet (50 mcg total) by mouth daily.   linaclotide 72 MCG capsule Commonly known as: Linzess Take 1 capsule (72 mcg total) by mouth daily before breakfast.   losartan 50 MG tablet Commonly known as: COZAAR Take 1 tablet (50 mg total) by mouth daily.   meloxicam 7.5 MG tablet Commonly known as: MOBIC Take 1 tablet (7.5 mg total) by mouth daily as needed for pain (back pain).   metFORMIN 500 MG 24 hr tablet Commonly known as: GLUCOPHAGE-XR Take 1 tablet (500 mg total) by mouth daily with breakfast.   metoprolol succinate 25 MG 24 hr tablet Commonly known as: TOPROL-XL Take 1.5 tablets (37.5 mg total) by mouth daily. Take with or immediately following a meal.   nitrofurantoin (macrocrystal-monohydrate) 100 MG capsule Commonly known as: MACROBID Take 1 capsule (100 mg total) by mouth every 12 (twelve) hours.   nystatin 100000 UNIT/ML suspension Commonly known as: MYCOSTATIN Take 5 mLs (500,000 Units total) by mouth 4 (four) times daily.   ondansetron 4 MG disintegrating tablet Commonly known as: ZOFRAN-ODT TAKE 1 TABLET BY MOUTH EVERY 8 HOURS AS NEEDED FOR NAUSEA AND VOMITING   OneTouch Delica Lancets 16X Misc Use to test blood sugars daily as directed. DX: E11.9   OneTouch Verio Reflect w/Device Kit Use to test blood sugars daily as directed. DX: E11.9   OneTouch Verio test strip Generic drug: glucose blood Use to test blood sugars daily as directed. DX: E11.9    phenazopyridine 100 MG tablet Commonly known as: Pyridium Take 1 tablet (100 mg total) by mouth 3 (three) times daily as needed for pain.   polyethylene glycol 17 g packet Commonly known as: MIRALAX / GLYCOLAX Take 17 g by mouth daily as needed for moderate constipation or severe constipation.   pravastatin 40 MG tablet Commonly known as: PRAVACHOL TAKE 1 TABLET (40 MG TOTAL) BY MOUTH ON MONDAYS , WEDNESDAYS, AND FRIDAYS AS DIRECTED What changed: See the new instructions.   Rybelsus 7 MG Tabs Generic drug: Semaglutide Take 7 mg by mouth daily.   Trelegy Ellipta 100-62.5-25 MCG/INH Aepb Generic drug: Fluticasone-Umeclidin-Vilant Inhale 1 puff into the lungs daily.   Uribel 118 MG Caps Take 1 capsule (118 mg total) by mouth 2 (two) times daily as needed. Started by: Nicolette Bang, MD   zolpidem 5 MG tablet Commonly known as: AMBIEN Take 1 tablet (5 mg total) by mouth at bedtime as needed  for sleep.       Allergies:  Allergies  Allergen Reactions  . Jardiance [Empagliflozin] Other (See Comments)    FEELS SLUGGISH, TIRED  . Lopressor [Metoprolol] Other (See Comments)    Fatigue  . Sulfa Antibiotics Swelling    Mouth swelling    Family History: Family History  Problem Relation Age of Onset  . Hypertension Mother   . Diabetes Father   . Heart disease Father   . Stroke Father   . Hypertension Sister     Social History:  reports that he has been smoking cigarettes. He started smoking about 59 years ago. He has a 27.50 pack-year smoking history. He has never used smokeless tobacco. He reports that he does not drink alcohol and does not use drugs.  ROS: All other review of systems were reviewed and are negative except what is noted above in HPI  Physical Exam: BP (!) 146/71   Pulse 87   Temp 98.1 F (36.7 C)   Ht 5' 6"  (1.676 m)   Wt 169 lb 6.4 oz (76.8 kg)   BMI 27.34 kg/m   Constitutional:  Alert and oriented, No acute distress. HEENT: [place] AT, moist  mucus membranes.  Trachea midline, no masses. Cardiovascular: No clubbing, cyanosis, or edema. Respiratory: Normal respiratory effort, no increased work of breathing. GI: Abdomen is soft, nontender, nondistended, no abdominal masses GU: No CVA tenderness.  Lymph: No cervical or inguinal lymphadenopathy. Skin: No rashes, bruises or suspicious lesions. Neurologic: Grossly intact, no focal deficits, moving all 4 extremities. Psychiatric: Normal mood and affect.  Laboratory Data: Lab Results  Component Value Date   WBC 11.0 (H) 06/12/2020   HGB 10.4 (L) 06/12/2020   HCT 31.3 (L) 06/12/2020   MCV 93 06/12/2020   PLT 418 06/12/2020    Lab Results  Component Value Date   CREATININE 0.94 06/14/2020    No results found for: PSA  No results found for: TESTOSTERONE  Lab Results  Component Value Date   HGBA1C 6.7 (H) 05/06/2020    Urinalysis    Component Value Date/Time   COLORURINE AMBER (A) 06/02/2020 0729   APPEARANCEUR Cloudy (A) 07/24/2020 1049   LABSPEC 1.016 06/02/2020 0729   PHURINE 6.0 06/02/2020 0729   GLUCOSEU Negative 07/24/2020 1049   HGBUR LARGE (A) 06/02/2020 0729   BILIRUBINUR Negative 07/24/2020 1049   KETONESUR NEGATIVE 06/02/2020 0729   PROTEINUR 3+ (A) 07/24/2020 1049   PROTEINUR 100 (A) 06/02/2020 0729   NITRITE Negative 07/24/2020 1049   NITRITE NEGATIVE 06/02/2020 0729   LEUKOCYTESUR 2+ (A) 07/24/2020 1049   LEUKOCYTESUR NEGATIVE 06/02/2020 0729    Lab Results  Component Value Date   LABMICR See below: 07/24/2020   WBCUA >30 (H) 07/24/2020   LABEPIT None seen 07/24/2020   MUCUS Present 07/13/2020   BACTERIA Moderate (A) 07/24/2020    Pertinent Imaging:  Results for orders placed in visit on 12/30/19  DG Abd 1 View  Narrative CLINICAL DATA:  Abdominal pain  EXAM: ABDOMEN - 1 VIEW  COMPARISON:  06/05/2017  FINDINGS: Scattered large and small bowel gas is noted. Mild retained fecal material is seen without obstructive change.  Degenerative change of the lumbar spine is noted. No abnormal calcifications are seen.  IMPRESSION: Mild retained fecal material.   Electronically Signed By: Inez Catalina M.D. On: 01/01/2020 15:15  Results for orders placed during the hospital encounter of 08/28/16  US Venous Img Lower Bilateral  Narrative CLINICAL DATA:  Bilateral leg swelling.  Treatment for  lung cancer.  EXAM: BILATERAL LOWER EXTREMITY VENOUS DOPPLER ULTRASOUND  TECHNIQUE: Gray-scale sonography with graded compression, as well as color Doppler and duplex ultrasound were performed to evaluate the lower extremity deep venous systems from the level of the common femoral vein and including the common femoral, femoral, profunda femoral, popliteal and calf veins including the posterior tibial, peroneal and gastrocnemius veins when visible. The superficial great saphenous vein was also interrogated. Spectral Doppler was utilized to evaluate flow at rest and with distal augmentation maneuvers in the common femoral, femoral and popliteal veins.  COMPARISON:  None.  FINDINGS: RIGHT LOWER EXTREMITY  Common Femoral Vein: No evidence of thrombus. Normal compressibility, respiratory phasicity and response to augmentation.  Saphenofemoral Junction: No evidence of thrombus. Normal compressibility and flow on color Doppler imaging.  Profunda Femoral Vein: No evidence of thrombus. Normal compressibility and flow on color Doppler imaging.  Femoral Vein: No evidence of thrombus. Normal compressibility, respiratory phasicity and response to augmentation.  Popliteal Vein: No evidence of thrombus. Normal compressibility, respiratory phasicity and response to augmentation.  Calf Veins: No evidence of thrombus. Normal compressibility and flow on color Doppler imaging.  Superficial Great Saphenous Vein: No evidence of thrombus. Normal compressibility and flow on color Doppler imaging.  Venous Reflux:   None.  Other Findings:  None.  LEFT LOWER EXTREMITY  Common Femoral Vein: No evidence of thrombus. Normal compressibility, respiratory phasicity and response to augmentation.  Saphenofemoral Junction: No evidence of thrombus. Normal compressibility and flow on color Doppler imaging.  Profunda Femoral Vein: No evidence of thrombus. Normal compressibility and flow on color Doppler imaging.  Femoral Vein: No evidence of thrombus. Normal compressibility, respiratory phasicity and response to augmentation.  Popliteal Vein: No evidence of thrombus. Normal compressibility, respiratory phasicity and response to augmentation.  Calf Veins: No evidence of thrombus. Normal compressibility and flow on color Doppler imaging.  Superficial Great Saphenous Vein: No evidence of thrombus. Normal compressibility and flow on color Doppler imaging.  Venous Reflux:  None.  Other Findings:  None.  IMPRESSION: No evidence of DVT within either lower extremity.   Electronically Signed By: Franchot Gallo M.D. On: 08/28/2016 14:17  No results found for this or any previous visit.  No results found for this or any previous visit.  No results found for this or any previous visit.  No results found for this or any previous visit.  No results found for this or any previous visit.  No results found for this or any previous visit.   Assessment & Plan:    1. BPH with urinary obstruction -improved with TURP - Urinalysis, Routine w reflex microscopic  2. Dysuria -uribel BID prn   No follow-ups on file.  Nicolette Bang, MD  [facility] [place]

## 2020-07-27 NOTE — Telephone Encounter (Signed)
Pt called and made aware per normal just weeks out from procedure. Pt seen in office today.

## 2020-07-27 NOTE — Progress Notes (Signed)
Urological Symptom Review  Patient is experiencing the following symptoms: Burning/pain with urination Get up at night to urinate Leakage of urine   Review of Systems  Gastrointestinal (upper)  : Nausea  Gastrointestinal (lower) : Constipation  Constitutional : Fatigue  Skin: Itching  Eyes: Blurred vision  Ear/Nose/Throat : Negative for Ear/Nose/Throat symptoms  Hematologic/Lymphatic: Negative for Hematologic/Lymphatic symptoms  Cardiovascular : Negative for cardiovascular symptoms  Respiratory : Cough Shortness of breath  Endocrine: Excessive thirst  Musculoskeletal: Negative for musculoskeletal symptoms  Neurological: Negative for neurological symptoms  Psychologic: Negative for psychiatric symptoms

## 2020-07-27 NOTE — Patient Instructions (Signed)

## 2020-07-27 NOTE — Telephone Encounter (Signed)
Patient called stating that uribel was to expensive and requested something cheaper. Per Dr. Alyson Ingles patient is to take azo bid. Patient made aware and voiced understanding.

## 2020-07-27 NOTE — Telephone Encounter (Signed)
Lmtcb.

## 2020-07-27 NOTE — Telephone Encounter (Signed)
Appt made

## 2020-07-27 NOTE — Progress Notes (Signed)
   Virtual Visit  Note Due to COVID-19 pandemic this visit was conducted virtually. This visit type was conducted due to national recommendations for restrictions regarding the COVID-19 Pandemic (e.g. social distancing, sheltering in place) in an effort to limit this patient's exposure and mitigate transmission in our community. All issues noted in this document were discussed and addressed.  A physical exam was not performed with this format.  I connected with John [name] on 07/27/20 at 12:21 pm  by telephone and verified that I am speaking with the correct person using two identifiers. John [name] is currently located at home and no one is currently with him during visit. The provider, Evelina Dun, FNP is located in their office at time of visit.  I discussed the limitations, risks, security and privacy concerns of performing an evaluation and management service by telephone and the availability of in person appointments. I also discussed with the patient that there may be a patient responsible charge related to this service. The patient expressed understanding and agreed to proceed.   History and Present Illness:  Insomnia Primary symptoms: difficulty falling asleep, frequent awakening, malaise/fatigue.  The current episode started more than one year. The onset quality is gradual. The problem occurs intermittently. Past treatments include medication. The treatment provided mild relief.      Review of Systems  Constitutional: Positive for malaise/fatigue.  Psychiatric/Behavioral: The patient has insomnia.   All other systems reviewed and are negative.    Observations/Objective: No SOB or distress noted  Assessment and Plan: 1. Primary insomnia Stop ambien and start Restoril  Sleep ritual  Keep follow up with me next week Call if symptoms worsen or do not improve  - temazepam (RESTORIL) 7.5 MG capsule; Take 1 capsule (7.5 mg total) by mouth at bedtime as needed for sleep.   Dispense: 30 capsule; Refill: 1     I discussed the assessment and treatment plan with the patient. The patient was provided an opportunity to ask questions and all were answered. The patient agreed with the plan and demonstrated an understanding of the instructions.   The patient was advised to call back or seek an in-person evaluation if the symptoms worsen or if the condition fails to improve as anticipated.  The above assessment and management plan was discussed with the patient. The patient verbalized understanding of and has agreed to the management plan. Patient is aware to call the clinic if symptoms persist or worsen. Patient is aware when to return to the clinic for a follow-up visit. Patient educated on when it is appropriate to go to the emergency department.   Time call ended:  12:32 pm   I provided 11 minutes of  non face-to-face time during this encounter.    Evelina Dun, FNP

## 2020-07-30 ENCOUNTER — Encounter (HOSPITAL_COMMUNITY): Payer: Medicare Other

## 2020-07-30 ENCOUNTER — Inpatient Hospital Stay (HOSPITAL_COMMUNITY): Payer: Medicare Other

## 2020-08-02 ENCOUNTER — Ambulatory Visit (HOSPITAL_COMMUNITY): Payer: Medicare Other | Admitting: Hematology

## 2020-08-02 ENCOUNTER — Other Ambulatory Visit: Payer: Self-pay | Admitting: *Deleted

## 2020-08-02 ENCOUNTER — Encounter: Payer: Self-pay | Admitting: *Deleted

## 2020-08-02 DIAGNOSIS — E1165 Type 2 diabetes mellitus with hyperglycemia: Secondary | ICD-10-CM | POA: Diagnosis not present

## 2020-08-02 DIAGNOSIS — J9611 Chronic respiratory failure with hypoxia: Secondary | ICD-10-CM | POA: Diagnosis not present

## 2020-08-02 DIAGNOSIS — I110 Hypertensive heart disease with heart failure: Secondary | ICD-10-CM | POA: Diagnosis not present

## 2020-08-02 DIAGNOSIS — I509 Heart failure, unspecified: Secondary | ICD-10-CM | POA: Diagnosis not present

## 2020-08-02 DIAGNOSIS — N401 Enlarged prostate with lower urinary tract symptoms: Secondary | ICD-10-CM | POA: Diagnosis not present

## 2020-08-02 DIAGNOSIS — J449 Chronic obstructive pulmonary disease, unspecified: Secondary | ICD-10-CM | POA: Diagnosis not present

## 2020-08-03 ENCOUNTER — Other Ambulatory Visit: Payer: Self-pay | Admitting: Family

## 2020-08-03 ENCOUNTER — Other Ambulatory Visit: Payer: Self-pay

## 2020-08-03 ENCOUNTER — Encounter: Payer: Self-pay | Admitting: Family

## 2020-08-03 ENCOUNTER — Ambulatory Visit (INDEPENDENT_AMBULATORY_CARE_PROVIDER_SITE_OTHER): Payer: Medicare Other | Admitting: Family

## 2020-08-03 VITALS — BP 135/61 | HR 87 | Temp 96.8°F | Ht 66.0 in | Wt 169.6 lb

## 2020-08-03 DIAGNOSIS — I152 Hypertension secondary to endocrine disorders: Secondary | ICD-10-CM

## 2020-08-03 DIAGNOSIS — E1142 Type 2 diabetes mellitus with diabetic polyneuropathy: Secondary | ICD-10-CM

## 2020-08-03 DIAGNOSIS — N138 Other obstructive and reflux uropathy: Secondary | ICD-10-CM | POA: Diagnosis not present

## 2020-08-03 DIAGNOSIS — C3491 Malignant neoplasm of unspecified part of right bronchus or lung: Secondary | ICD-10-CM | POA: Diagnosis not present

## 2020-08-03 DIAGNOSIS — J40 Bronchitis, not specified as acute or chronic: Secondary | ICD-10-CM | POA: Diagnosis not present

## 2020-08-03 DIAGNOSIS — I739 Peripheral vascular disease, unspecified: Secondary | ICD-10-CM | POA: Diagnosis not present

## 2020-08-03 DIAGNOSIS — F5101 Primary insomnia: Secondary | ICD-10-CM

## 2020-08-03 DIAGNOSIS — F17210 Nicotine dependence, cigarettes, uncomplicated: Secondary | ICD-10-CM

## 2020-08-03 DIAGNOSIS — E039 Hypothyroidism, unspecified: Secondary | ICD-10-CM

## 2020-08-03 DIAGNOSIS — J441 Chronic obstructive pulmonary disease with (acute) exacerbation: Secondary | ICD-10-CM

## 2020-08-03 DIAGNOSIS — R300 Dysuria: Secondary | ICD-10-CM | POA: Diagnosis not present

## 2020-08-03 DIAGNOSIS — I509 Heart failure, unspecified: Secondary | ICD-10-CM | POA: Diagnosis not present

## 2020-08-03 DIAGNOSIS — E1159 Type 2 diabetes mellitus with other circulatory complications: Secondary | ICD-10-CM | POA: Diagnosis not present

## 2020-08-03 DIAGNOSIS — Z720 Tobacco use: Secondary | ICD-10-CM | POA: Diagnosis not present

## 2020-08-03 DIAGNOSIS — E1169 Type 2 diabetes mellitus with other specified complication: Secondary | ICD-10-CM | POA: Diagnosis not present

## 2020-08-03 DIAGNOSIS — I2510 Atherosclerotic heart disease of native coronary artery without angina pectoris: Secondary | ICD-10-CM

## 2020-08-03 DIAGNOSIS — I10 Essential (primary) hypertension: Secondary | ICD-10-CM

## 2020-08-03 DIAGNOSIS — E785 Hyperlipidemia, unspecified: Secondary | ICD-10-CM

## 2020-08-03 DIAGNOSIS — J449 Chronic obstructive pulmonary disease, unspecified: Secondary | ICD-10-CM

## 2020-08-03 DIAGNOSIS — N401 Enlarged prostate with lower urinary tract symptoms: Secondary | ICD-10-CM | POA: Diagnosis not present

## 2020-08-03 LAB — URINALYSIS, COMPLETE
Bilirubin, UA: NEGATIVE
Glucose, UA: NEGATIVE
Ketones, UA: NEGATIVE
Nitrite, UA: NEGATIVE
Specific Gravity, UA: 1.025 (ref 1.005–1.030)
Urobilinogen, Ur: 0.2 mg/dL (ref 0.2–1.0)
pH, UA: 5 (ref 5.0–7.5)

## 2020-08-03 LAB — BAYER DCA HB A1C WAIVED: HB A1C (BAYER DCA - WAIVED): 6.4 % (ref <7.0)

## 2020-08-03 LAB — MICROSCOPIC EXAMINATION
Epithelial Cells (non renal): NONE SEEN /hpf (ref 0–10)
WBC, UA: >30 /hpf — AB (ref 0–5)

## 2020-08-03 MED ORDER — PREDNISONE 10 MG (21) PO TBPK: ORAL_TABLET | ORAL | 0 refills | Status: DC

## 2020-08-03 MED ORDER — LOSARTAN POTASSIUM 50 MG PO TABS
50.0000 mg | ORAL_TABLET | Freq: Every day | ORAL | 3 refills | Status: DC
Start: 2020-08-03 — End: 2020-08-30

## 2020-08-03 MED ORDER — CIPROFLOXACIN HCL 500 MG PO TABS: 500.0000 mg | ORAL_TABLET | Freq: Two times a day (BID) | ORAL | 0 refills | Status: DC

## 2020-08-03 MED ORDER — FLUTICASONE PROPIONATE 50 MCG/ACT NA SUSP: 1.0000 | Freq: Every day | NASAL | 2 refills | Status: DC | PRN

## 2020-08-03 NOTE — Patient Instructions (Signed)
Acute Bronchitis, Adult  Acute bronchitis is sudden or acute swelling of the air tubes (bronchi) in the lungs. Acute bronchitis causes these tubes to fill with mucus, which can make it hard to breathe. It can also cause coughing or wheezing. In adults, acute bronchitis usually goes away within 2 weeks. A cough caused by bronchitis may last up to 3 weeks. Smoking, allergies, and asthma can make the condition worse. What are the causes? This condition can be caused by germs and by substances that irritate the lungs, including:  Cold and flu viruses. The most common cause of this condition is the virus that causes the common cold.  Bacteria.  Substances that irritate the lungs, including: ? Smoke from cigarettes and other forms of tobacco. ? Dust and pollen. ? Fumes from chemical products, gases, or burned fuel. ? Other materials that pollute indoor or outdoor air.  Close contact with someone who has acute bronchitis. What increases the risk? The following factors may make you more likely to develop this condition:  A weak body's defense system, also called the immune system.  A condition that affects your lungs and breathing, such as asthma. What are the signs or symptoms? Common symptoms of this condition include:  Lung and breathing problems, such as: ? Coughing. This may bring up clear, yellow, or green mucus from your lungs (sputum). ? Wheezing. ? Having too much mucus in your lungs (chest congestion). ? Having shortness of breath.  A fever.  Chills.  Aches and pains, including: ? Tightness in your chest and other body aches. ? A sore throat. How is this diagnosed? This condition is usually diagnosed based on:  Your symptoms and medical history.  A physical exam. You may also have other tests, including tests to rule out other conditions, such pneumonia. These tests include:  A test of lung function.  Test of a mucus sample to look for the presence of  bacteria.  Tests to check the oxygen level in your blood.  Blood tests.  Chest X-ray. How is this treated? Most cases of acute bronchitis clear up over time without treatment. Your health care provider may recommend:  Drinking more fluids. This can thin your mucus, which may improve your breathing.  Taking a medicine for a fever or cough.  Using a device that gets medicine into your lungs (inhaler) to help improve breathing and control coughing.  Using a vaporizer or a humidifier. These are machines that add water to the air to help you breathe better. Follow these instructions at home: Activity  Get plenty of rest.  Return to your normal activities as told by your health care provider. Ask your health care provider what activities are safe for you. Lifestyle  Drink enough fluid to keep your urine pale yellow.  Do not drink alcohol.  Do not use any products that contain nicotine or tobacco, such as cigarettes, e-cigarettes, and chewing tobacco. If you need help quitting, ask your health care provider. Be aware that: ? Your bronchitis will get worse if you smoke or breathe in other people's smoke (secondhand smoke). ? Your lungs will heal faster if you quit smoking. General instructions  Take over-the-counter and prescription medicines only as told by your health care provider.  Use an inhaler, vaporizer, or humidifier as told by your health care provider.  If you have a sore throat, gargle with a salt-water mixture 3-4 times a day or as needed. To make a salt-water mixture, completely dissolve -1 tsp (3-6 g)  of salt in 1 cup (237 mL) of warm water.  Keep all follow-up visits as told by your health care provider. This is important.   How is this prevented? To lower your risk of getting this condition again:  Wash your hands often with soap and water. If soap and water are not available, use hand sanitizer.  Avoid contact with people who have cold symptoms.  Try not to  touch your mouth, nose, or eyes with your hands.  Avoid places where there are fumes from chemicals. Breathing these fumes will make your condition worse.  Get the flu shot every year.   Contact a health care provider if:  Your symptoms do not improve after 2 weeks of treatment.  You vomit more than once or twice.  You have symptoms of dehydration such as: ? Dark urine. ? Dry skin or eyes. ? Increased thirst. ? Headaches. ? Confusion. ? Muscle cramps. Get help right away if you:  Cough up blood.  Feel pain in your chest.  Have severe shortness of breath.  Faint or keep feeling like you are going to faint.  Have a severe headache.  Have fever or chills that get worse. These symptoms may represent a serious problem that is an emergency. Do not wait to see if the symptoms will go away. Get medical help right away. Call your local emergency services (911 in the [place] Do not drive yourself to the hospital. Summary  Acute bronchitis is sudden (acute) inflammation of the air tubes (bronchi) between the windpipe and the lungs. In adults, acute bronchitis usually goes away within 2 weeks, although coughing may last 3 weeks or longer  Take over-the-counter and prescription medicines only as told by your health care provider.  Drink enough fluid to keep your urine pale yellow.  Contact a health care provider if your symptoms do not improve after 2 weeks of treatment.  Get help right away if you cough up blood, faint, or have chest pain or shortness of breath. This information is not intended to replace advice given to you by your health care provider. Make sure you discuss any questions you have with your health care provider. Document Revised: 12/06/2018 Document Reviewed: 10/15/2018 [employer] Patient Education  Center.

## 2020-08-03 NOTE — Progress Notes (Signed)
Subjective:    Patient ID: John [name], male    DOB: October 05, 1945, 75 y.o.   MRN: [id number]  Chief Complaint  Patient presents with  . Insomnia  . COPD    Sob     Pt presents to the [facility] for chronic care follow up. He is followed by Cardiologists for CHF. Followed by Oncologists every 6 months for non-small lung cancer. He is scheduled for PET 07/14/20.He is followed by Pulmonologist every 6 months for COPD.He is followed by Vascular for PAD and is scheduled for stent placement.   He is followed by Urologists for BPH and Prostate. Insomnia Primary symptoms: difficulty falling asleep, frequent awakening.  The current episode started more than one year. The onset quality is gradual. The problem occurs intermittently.  COPD He complains of cough, difficulty breathing, hoarse voice, shortness of breath and wheezing. There is no chest tightness. This is a chronic problem. The current episode started more than 1 year ago. His past medical history is significant for COPD.  Diabetes He presents for his follow-up diabetic visit. He has type 2 diabetes mellitus. There are no hypoglycemic associated symptoms. Associated symptoms include fatigue and foot paresthesias. Pertinent negatives for diabetes include no blurred vision. Symptoms are stable. Diabetic complications include heart disease and peripheral neuropathy. Risk factors for coronary artery disease include dyslipidemia, diabetes mellitus, male sex and hypertension. He is following a generally unhealthy diet. His overall blood glucose range is 180-200 mg/dl. An ACE inhibitor/angiotensin II receptor blocker is being taken. Eye exam is current.  Congestive Heart Failure Presents for follow-up visit. Associated symptoms include edema, fatigue and shortness of breath. The symptoms have been stable.  Thyroid Problem Presents for follow-up visit. Symptoms include depressed mood, fatigue and hoarse voice. The symptoms have been stable.  His past medical history is significant for hyperlipidemia.  Hyperlipidemia This is a chronic problem. The current episode started more than 1 year ago. The problem is controlled. Associated symptoms include shortness of breath. The current treatment provides moderate improvement of lipids. Risk factors for coronary artery disease include dyslipidemia, diabetes mellitus, male sex, hypertension and a sedentary lifestyle.  Arthritis Presents for follow-up visit. He complains of pain and stiffness. The symptoms have been stable. Affected locations include the left knee, right knee, right MCP and left MCP. His pain is at a severity of 3/10. Associated symptoms include dysuria and fatigue.  Nicotine Dependence Presents for follow-up visit. Symptoms include fatigue and insomnia. His urge triggers include company of smokers. The symptoms have been stable. He smokes < 1/2 a pack of cigarettes per day.  Dysuria  This is a recurrent problem. The current episode started more than 1 month ago. The problem occurs intermittently. The problem has been waxing and waning. The quality of the pain is described as burning. Associated symptoms include frequency and urgency. Pertinent negatives include no flank pain, hematuria, nausea or vomiting. He has tried increased fluids for the symptoms.      Review of Systems  Constitutional: Positive for fatigue.  HENT: Positive for hoarse voice.   Eyes: Negative for blurred vision.  Respiratory: Positive for cough, shortness of breath and wheezing.   Gastrointestinal: Negative for nausea and vomiting.  Genitourinary: Positive for dysuria, frequency and urgency. Negative for flank pain and hematuria.  Musculoskeletal: Positive for arthritis and stiffness.  Psychiatric/Behavioral: The patient has insomnia.   All other systems reviewed and are negative.      Objective:   Physical Exam Vitals reviewed.  Constitutional:  General: He is not in acute distress.     Appearance: He is well-developed.  HENT:     Head: Normocephalic.     Right Ear: Tympanic membrane normal.     Left Ear: Tympanic membrane normal.  Eyes:     General:        Right eye: No discharge.        Left eye: No discharge.     Pupils: Pupils are equal, round, and reactive to light.  Neck:     Thyroid: No thyromegaly.  Cardiovascular:     Rate and Rhythm: Normal rate and regular rhythm.     Heart sounds: Normal heart sounds. No murmur heard.   Pulmonary:     Effort: Pulmonary effort is normal. No respiratory distress.     Breath sounds: Wheezing present.  Abdominal:     General: Bowel sounds are normal. There is no distension.     Palpations: Abdomen is soft.     Tenderness: There is no abdominal tenderness.  Musculoskeletal:        General: No tenderness. Normal range of motion.     Cervical back: Normal range of motion and neck supple.  Skin:    General: Skin is warm and dry.     Findings: No erythema or rash.  Neurological:     Mental Status: He is alert and oriented to person, place, and time.     Cranial Nerves: No cranial nerve deficit.     Deep Tendon Reflexes: Reflexes are normal and symmetric.  Psychiatric:        Behavior: Behavior normal.        Thought Content: Thought content normal.        Judgment: Judgment normal.        BP 135/61   Pulse 87   Temp (!) 96.8 F (36 C) (Temporal)   Ht 5' 6"  (1.676 m)   Wt 169 lb 9.6 oz (76.9 kg)   SpO2 97%   BMI 27.37 kg/m   Assessment & Plan:  Janice Seales comes in today with chief complaint of Insomnia and COPD (Sob )   Diagnosis and orders addressed:  1. Hypertension associated with diabetes ([place]) - losartan (COZAAR) 50 MG tablet; Take 1 tablet (50 mg total) by mouth daily.  Dispense: 90 tablet; Refill: 3 - CMP14+EGFR - CBC with Differential/Platelet  2. Congestive heart failure, unspecified HF chronicity, unspecified heart failure type ([place]) - CMP14+EGFR - CBC with Differential/Platelet  3.  Essential hypertension - CMP14+EGFR - CBC with Differential/Platelet  4. PAD (peripheral artery disease) (HCC) - CMP14+EGFR - CBC with Differential/Platelet  5. Chronic obstructive pulmonary disease, unspecified COPD type ([place]) - CMP14+EGFR - CBC with Differential/Platelet  6. Bronchitis due to tobacco use - CMP14+EGFR - CBC with Differential/Platelet - predniSONE (STERAPRED UNI-PAK 21 TAB) 10 MG (21) TBPK tablet; Use as directed  Dispense: 21 tablet; Refill: 0  7. COPD with acute exacerbation (HCC) - CMP14+EGFR - CBC with Differential/Platelet  8. Non-small cell lung cancer, right (HCC) - CMP14+EGFR - CBC with Differential/Platelet  9. Type 2 diabetes mellitus with diabetic polyneuropathy, without long-term current use of insulin (HCC) - Bayer DCA Hb A1c Waived - CMP14+EGFR - CBC with Differential/Platelet  10. Diabetic polyneuropathy associated with type 2 diabetes mellitus (HCC) - CMP14+EGFR - CBC with Differential/Platelet  11. Hyperlipidemia associated with type 2 diabetes mellitus (HCC) - CMP14+EGFR - CBC with Differential/Platelet  12. Hypothyroidism, unspecified type - CMP14+EGFR - CBC with Differential/Platelet - TSH  13. BPH  with urinary obstruction  - CMP14+EGFR - CBC with Differential/Platelet  14. Cigarette smoker  - CMP14+EGFR - CBC with Differential/Platelet  15. Primary insomnia - CMP14+EGFR - CBC with Differential/Platelet  16. Tobacco abuse - CMP14+EGFR - CBC with Differential/Platelet  17. Dysuria - CMP14+EGFR - CBC with Differential/Platelet - Urinalysis, Complete - Urine Culture   Labs pending Health Maintenance reviewed Diet and exercise encouraged  Follow up plan: 3 months    Evelina Dun, FNP

## 2020-08-04 LAB — CBC WITH DIFFERENTIAL/PLATELET
Basophils Absolute: 0 10*3/uL (ref 0.0–0.2)
Basos: 0 %
EOS (ABSOLUTE): 0.2 10*3/uL (ref 0.0–0.4)
Eos: 2 %
Hematocrit: 34.6 % — ABNORMAL LOW (ref 37.5–51.0)
Hemoglobin: 10.8 g/dL — ABNORMAL LOW (ref 13.0–17.7)
Immature Grans (Abs): 0 10*3/uL (ref 0.0–0.1)
Immature Granulocytes: 0 %
Lymphocytes Absolute: 0.7 10*3/uL (ref 0.7–3.1)
Lymphs: 8 %
MCH: 25.9 pg — ABNORMAL LOW (ref 26.6–33.0)
MCHC: 31.2 g/dL — ABNORMAL LOW (ref 31.5–35.7)
MCV: 83 fL (ref 79–97)
Monocytes Absolute: 0.9 10*3/uL (ref 0.1–0.9)
Monocytes: 10 %
Neutrophils Absolute: 7.1 10*3/uL — ABNORMAL HIGH (ref 1.4–7.0)
Neutrophils: 80 %
Platelets: 282 10*3/uL (ref 150–450)
RBC: 4.17 x10E6/uL (ref 4.14–5.80)
RDW: 15.4 % (ref 11.6–15.4)
WBC: 9 10*3/uL (ref 3.4–10.8)

## 2020-08-04 LAB — CMP14+EGFR
ALT: 15 IU/L (ref 0–44)
AST: 16 IU/L (ref 0–40)
Albumin/Globulin Ratio: 1.3 (ref 1.2–2.2)
Albumin: 3.9 g/dL (ref 3.7–4.7)
Alkaline Phosphatase: 127 IU/L — ABNORMAL HIGH (ref 44–121)
BUN/Creatinine Ratio: 19 (ref 10–24)
BUN: 18 mg/dL (ref 8–27)
Bilirubin Total: 0.4 mg/dL (ref 0.0–1.2)
CO2: 26 mmol/L (ref 20–29)
Calcium: 9.3 mg/dL (ref 8.6–10.2)
Chloride: 101 mmol/L (ref 96–106)
Creatinine, Ser: 0.97 mg/dL (ref 0.76–1.27)
Globulin, Total: 3 g/dL (ref 1.5–4.5)
Glucose: 131 mg/dL — ABNORMAL HIGH (ref 65–99)
Potassium: 4.7 mmol/L (ref 3.5–5.2)
Sodium: 141 mmol/L (ref 134–144)
Total Protein: 6.9 g/dL (ref 6.0–8.5)
eGFR: 82 mL/min/{1.73_m2} (ref >59)

## 2020-08-04 LAB — TSH: TSH: 2.18 u[IU]/mL (ref 0.450–4.500)

## 2020-08-05 LAB — URINE CULTURE

## 2020-08-06 DIAGNOSIS — J449 Chronic obstructive pulmonary disease, unspecified: Secondary | ICD-10-CM | POA: Diagnosis not present

## 2020-08-06 DIAGNOSIS — N401 Enlarged prostate with lower urinary tract symptoms: Secondary | ICD-10-CM | POA: Diagnosis not present

## 2020-08-06 DIAGNOSIS — J9611 Chronic respiratory failure with hypoxia: Secondary | ICD-10-CM | POA: Diagnosis not present

## 2020-08-06 DIAGNOSIS — I509 Heart failure, unspecified: Secondary | ICD-10-CM | POA: Diagnosis not present

## 2020-08-06 DIAGNOSIS — I110 Hypertensive heart disease with heart failure: Secondary | ICD-10-CM | POA: Diagnosis not present

## 2020-08-06 DIAGNOSIS — E1165 Type 2 diabetes mellitus with hyperglycemia: Secondary | ICD-10-CM | POA: Diagnosis not present

## 2020-08-07 DIAGNOSIS — J9611 Chronic respiratory failure with hypoxia: Secondary | ICD-10-CM | POA: Diagnosis not present

## 2020-08-07 DIAGNOSIS — I110 Hypertensive heart disease with heart failure: Secondary | ICD-10-CM | POA: Diagnosis not present

## 2020-08-07 DIAGNOSIS — F17210 Nicotine dependence, cigarettes, uncomplicated: Secondary | ICD-10-CM | POA: Diagnosis not present

## 2020-08-07 DIAGNOSIS — K449 Diaphragmatic hernia without obstruction or gangrene: Secondary | ICD-10-CM | POA: Diagnosis not present

## 2020-08-07 DIAGNOSIS — F419 Anxiety disorder, unspecified: Secondary | ICD-10-CM | POA: Diagnosis not present

## 2020-08-07 DIAGNOSIS — Z7902 Long term (current) use of antithrombotics/antiplatelets: Secondary | ICD-10-CM | POA: Diagnosis not present

## 2020-08-07 DIAGNOSIS — N401 Enlarged prostate with lower urinary tract symptoms: Secondary | ICD-10-CM | POA: Diagnosis not present

## 2020-08-07 DIAGNOSIS — I739 Peripheral vascular disease, unspecified: Secondary | ICD-10-CM | POA: Diagnosis not present

## 2020-08-07 DIAGNOSIS — I509 Heart failure, unspecified: Secondary | ICD-10-CM | POA: Diagnosis not present

## 2020-08-07 DIAGNOSIS — K831 Obstruction of bile duct: Secondary | ICD-10-CM | POA: Diagnosis not present

## 2020-08-07 DIAGNOSIS — J449 Chronic obstructive pulmonary disease, unspecified: Secondary | ICD-10-CM | POA: Diagnosis not present

## 2020-08-07 DIAGNOSIS — Z48812 Encounter for surgical aftercare following surgery on the circulatory system: Secondary | ICD-10-CM | POA: Diagnosis not present

## 2020-08-07 DIAGNOSIS — E1140 Type 2 diabetes mellitus with diabetic neuropathy, unspecified: Secondary | ICD-10-CM | POA: Diagnosis not present

## 2020-08-07 DIAGNOSIS — Z7901 Long term (current) use of anticoagulants: Secondary | ICD-10-CM | POA: Diagnosis not present

## 2020-08-07 DIAGNOSIS — C3491 Malignant neoplasm of unspecified part of right bronchus or lung: Secondary | ICD-10-CM | POA: Diagnosis not present

## 2020-08-07 DIAGNOSIS — R338 Other retention of urine: Secondary | ICD-10-CM | POA: Diagnosis not present

## 2020-08-07 DIAGNOSIS — E039 Hypothyroidism, unspecified: Secondary | ICD-10-CM | POA: Diagnosis not present

## 2020-08-07 DIAGNOSIS — K219 Gastro-esophageal reflux disease without esophagitis: Secondary | ICD-10-CM | POA: Diagnosis not present

## 2020-08-07 DIAGNOSIS — N390 Urinary tract infection, site not specified: Secondary | ICD-10-CM | POA: Diagnosis not present

## 2020-08-07 DIAGNOSIS — I071 Rheumatic tricuspid insufficiency: Secondary | ICD-10-CM | POA: Diagnosis not present

## 2020-08-07 DIAGNOSIS — Z95820 Peripheral vascular angioplasty status with implants and grafts: Secondary | ICD-10-CM | POA: Diagnosis not present

## 2020-08-07 DIAGNOSIS — I4892 Unspecified atrial flutter: Secondary | ICD-10-CM | POA: Diagnosis not present

## 2020-08-07 DIAGNOSIS — G894 Chronic pain syndrome: Secondary | ICD-10-CM | POA: Diagnosis not present

## 2020-08-07 DIAGNOSIS — M1990 Unspecified osteoarthritis, unspecified site: Secondary | ICD-10-CM | POA: Diagnosis not present

## 2020-08-07 DIAGNOSIS — A4152 Sepsis due to Pseudomonas: Secondary | ICD-10-CM | POA: Diagnosis not present

## 2020-08-07 DIAGNOSIS — G4733 Obstructive sleep apnea (adult) (pediatric): Secondary | ICD-10-CM | POA: Diagnosis not present

## 2020-08-07 DIAGNOSIS — I2510 Atherosclerotic heart disease of native coronary artery without angina pectoris: Secondary | ICD-10-CM | POA: Diagnosis not present

## 2020-08-07 DIAGNOSIS — Z7982 Long term (current) use of aspirin: Secondary | ICD-10-CM | POA: Diagnosis not present

## 2020-08-07 DIAGNOSIS — E1151 Type 2 diabetes mellitus with diabetic peripheral angiopathy without gangrene: Secondary | ICD-10-CM | POA: Diagnosis not present

## 2020-08-07 DIAGNOSIS — F329 Major depressive disorder, single episode, unspecified: Secondary | ICD-10-CM | POA: Diagnosis not present

## 2020-08-07 DIAGNOSIS — H3530 Unspecified macular degeneration: Secondary | ICD-10-CM | POA: Diagnosis not present

## 2020-08-07 DIAGNOSIS — E785 Hyperlipidemia, unspecified: Secondary | ICD-10-CM | POA: Diagnosis not present

## 2020-08-07 DIAGNOSIS — E1165 Type 2 diabetes mellitus with hyperglycemia: Secondary | ICD-10-CM | POA: Diagnosis not present

## 2020-08-08 ENCOUNTER — Encounter: Payer: Self-pay | Admitting: Family

## 2020-08-08 ENCOUNTER — Other Ambulatory Visit: Payer: Self-pay

## 2020-08-08 ENCOUNTER — Ambulatory Visit (INDEPENDENT_AMBULATORY_CARE_PROVIDER_SITE_OTHER): Payer: Medicare Other | Admitting: Family

## 2020-08-08 VITALS — BP 142/69 | HR 90 | Temp 98.2°F | Ht 66.0 in | Wt 171.0 lb

## 2020-08-08 DIAGNOSIS — J440 Chronic obstructive pulmonary disease with acute lower respiratory infection: Secondary | ICD-10-CM | POA: Diagnosis not present

## 2020-08-08 DIAGNOSIS — J209 Acute bronchitis, unspecified: Secondary | ICD-10-CM | POA: Diagnosis not present

## 2020-08-08 DIAGNOSIS — J40 Bronchitis, not specified as acute or chronic: Secondary | ICD-10-CM

## 2020-08-08 DIAGNOSIS — D649 Anemia, unspecified: Secondary | ICD-10-CM

## 2020-08-08 DIAGNOSIS — I2510 Atherosclerotic heart disease of native coronary artery without angina pectoris: Secondary | ICD-10-CM | POA: Diagnosis not present

## 2020-08-08 DIAGNOSIS — Z720 Tobacco use: Secondary | ICD-10-CM

## 2020-08-08 LAB — CBC WITH DIFFERENTIAL/PLATELET
Basophils Absolute: 0 10*3/uL (ref 0.0–0.2)
Basos: 0 %
EOS (ABSOLUTE): 0 10*3/uL (ref 0.0–0.4)
Eos: 0 %
Hematocrit: 37.5 % (ref 37.5–51.0)
Hemoglobin: 11.2 g/dL — ABNORMAL LOW (ref 13.0–17.7)
Immature Grans (Abs): 0.1 10*3/uL (ref 0.0–0.1)
Immature Granulocytes: 1 %
Lymphocytes Absolute: 0.4 10*3/uL — ABNORMAL LOW (ref 0.7–3.1)
Lymphs: 3 %
MCH: 25.5 pg — ABNORMAL LOW (ref 26.6–33.0)
MCHC: 29.9 g/dL — ABNORMAL LOW (ref 31.5–35.7)
MCV: 85 fL (ref 79–97)
Monocytes Absolute: 0.6 10*3/uL (ref 0.1–0.9)
Monocytes: 5 %
Neutrophils Absolute: 10.4 10*3/uL — ABNORMAL HIGH (ref 1.4–7.0)
Neutrophils: 91 %
Platelets: 383 10*3/uL (ref 150–450)
RBC: 4.4 x10E6/uL (ref 4.14–5.80)
RDW: 15.2 % (ref 11.6–15.4)
WBC: 11.5 10*3/uL — ABNORMAL HIGH (ref 3.4–10.8)

## 2020-08-08 MED ORDER — PREDNISONE 10 MG (21) PO TBPK: ORAL_TABLET | ORAL | 0 refills | Status: DC

## 2020-08-08 MED ORDER — ALBUTEROL SULFATE HFA 108 (90 BASE) MCG/ACT IN AERS
2.0000 | INHALATION_SPRAY | RESPIRATORY_TRACT | 6 refills | Status: DC | PRN
Start: 2020-08-08 — End: 2021-03-05

## 2020-08-08 MED ORDER — DOXYCYCLINE HYCLATE 100 MG PO TABS
100.0000 mg | ORAL_TABLET | Freq: Two times a day (BID) | ORAL | 0 refills | Status: DC
Start: 2020-08-08 — End: 2020-08-30

## 2020-08-08 NOTE — Patient Instructions (Signed)
Acute Bronchitis, Adult  Acute bronchitis is sudden or acute swelling of the air tubes (bronchi) in the lungs. Acute bronchitis causes these tubes to fill with mucus, which can make it hard to breathe. It can also cause coughing or wheezing. In adults, acute bronchitis usually goes away within 2 weeks. A cough caused by bronchitis may last up to 3 weeks. Smoking, allergies, and asthma can make the condition worse. What are the causes? This condition can be caused by germs and by substances that irritate the lungs, including:  Cold and flu viruses. The most common cause of this condition is the virus that causes the common cold.  Bacteria.  Substances that irritate the lungs, including: ? Smoke from cigarettes and other forms of tobacco. ? Dust and pollen. ? Fumes from chemical products, gases, or burned fuel. ? Other materials that pollute indoor or outdoor air.  Close contact with someone who has acute bronchitis. What increases the risk? The following factors may make you more likely to develop this condition:  A weak body's defense system, also called the immune system.  A condition that affects your lungs and breathing, such as asthma. What are the signs or symptoms? Common symptoms of this condition include:  Lung and breathing problems, such as: ? Coughing. This may bring up clear, yellow, or green mucus from your lungs (sputum). ? Wheezing. ? Having too much mucus in your lungs (chest congestion). ? Having shortness of breath.  A fever.  Chills.  Aches and pains, including: ? Tightness in your chest and other body aches. ? A sore throat. How is this diagnosed? This condition is usually diagnosed based on:  Your symptoms and medical history.  A physical exam. You may also have other tests, including tests to rule out other conditions, such pneumonia. These tests include:  A test of lung function.  Test of a mucus sample to look for the presence of  bacteria.  Tests to check the oxygen level in your blood.  Blood tests.  Chest X-ray. How is this treated? Most cases of acute bronchitis clear up over time without treatment. Your health care provider may recommend:  Drinking more fluids. This can thin your mucus, which may improve your breathing.  Taking a medicine for a fever or cough.  Using a device that gets medicine into your lungs (inhaler) to help improve breathing and control coughing.  Using a vaporizer or a humidifier. These are machines that add water to the air to help you breathe better. Follow these instructions at home: Activity  Get plenty of rest.  Return to your normal activities as told by your health care provider. Ask your health care provider what activities are safe for you. Lifestyle  Drink enough fluid to keep your urine pale yellow.  Do not drink alcohol.  Do not use any products that contain nicotine or tobacco, such as cigarettes, e-cigarettes, and chewing tobacco. If you need help quitting, ask your health care provider. Be aware that: ? Your bronchitis will get worse if you smoke or breathe in other people's smoke (secondhand smoke). ? Your lungs will heal faster if you quit smoking. General instructions  Take over-the-counter and prescription medicines only as told by your health care provider.  Use an inhaler, vaporizer, or humidifier as told by your health care provider.  If you have a sore throat, gargle with a salt-water mixture 3-4 times a day or as needed. To make a salt-water mixture, completely dissolve -1 tsp (3-6 g)  of salt in 1 cup (237 mL) of warm water.  Keep all follow-up visits as told by your health care provider. This is important.   How is this prevented? To lower your risk of getting this condition again:  Wash your hands often with soap and water. If soap and water are not available, use hand sanitizer.  Avoid contact with people who have cold symptoms.  Try not to  touch your mouth, nose, or eyes with your hands.  Avoid places where there are fumes from chemicals. Breathing these fumes will make your condition worse.  Get the flu shot every year.   Contact a health care provider if:  Your symptoms do not improve after 2 weeks of treatment.  You vomit more than once or twice.  You have symptoms of dehydration such as: ? Dark urine. ? Dry skin or eyes. ? Increased thirst. ? Headaches. ? Confusion. ? Muscle cramps. Get help right away if you:  Cough up blood.  Feel pain in your chest.  Have severe shortness of breath.  Faint or keep feeling like you are going to faint.  Have a severe headache.  Have fever or chills that get worse. These symptoms may represent a serious problem that is an emergency. Do not wait to see if the symptoms will go away. Get medical help right away. Call your local emergency services (911 in the [place] Do not drive yourself to the hospital. Summary  Acute bronchitis is sudden (acute) inflammation of the air tubes (bronchi) between the windpipe and the lungs. In adults, acute bronchitis usually goes away within 2 weeks, although coughing may last 3 weeks or longer  Take over-the-counter and prescription medicines only as told by your health care provider.  Drink enough fluid to keep your urine pale yellow.  Contact a health care provider if your symptoms do not improve after 2 weeks of treatment.  Get help right away if you cough up blood, faint, or have chest pain or shortness of breath. This information is not intended to replace advice given to you by your health care provider. Make sure you discuss any questions you have with your health care provider. Document Revised: 12/06/2018 Document Reviewed: 10/15/2018 Elsevier Patient Education  [place].

## 2020-08-08 NOTE — Progress Notes (Signed)
Subjective:    Patient ID: John [name], male    DOB: 11-28-1945, 75 y.o.   MRN: [id number]  Chief Complaint  Patient presents with  . Shortness of Breath    Shortness of Breath This is a new problem. Episode onset: two days ago. The problem has been unchanged. Associated symptoms include headaches, rhinorrhea, sputum production and wheezing. Pertinent negatives include no chest pain, ear pain, fever, leg pain, neck pain or sore throat. He has tried rest for the symptoms. The treatment provided mild relief.      Review of Systems  Constitutional: Negative for fever.  HENT: Positive for rhinorrhea. Negative for ear pain and sore throat.   Respiratory: Positive for sputum production, shortness of breath and wheezing.   Cardiovascular: Negative for chest pain.  Musculoskeletal: Negative for neck pain.  Neurological: Positive for headaches.  All other systems reviewed and are negative.      Objective:   Physical Exam Vitals reviewed.  Constitutional:      General: He is not in acute distress.    Appearance: He is well-developed.  HENT:     Head: Normocephalic.     Right Ear: External ear normal.     Left Ear: External ear normal.  Eyes:     General:        Right eye: No discharge.        Left eye: No discharge.     Pupils: Pupils are equal, round, and reactive to light.  Neck:     Thyroid: No thyromegaly.  Cardiovascular:     Rate and Rhythm: Normal rate and regular rhythm.     Heart sounds: Normal heart sounds. No murmur heard.   Pulmonary:     Effort: Pulmonary effort is normal. No respiratory distress.     Breath sounds: Wheezing present.  Abdominal:     General: Bowel sounds are normal. There is no distension.     Palpations: Abdomen is soft.     Tenderness: There is no abdominal tenderness.  Musculoskeletal:        General: No tenderness. Normal range of motion.     Cervical back: Normal range of motion and neck supple.  Skin:    General: Skin is warm  and dry.     Findings: No erythema or rash.  Neurological:     Mental Status: He is alert and oriented to person, place, and time.     Cranial Nerves: No cranial nerve deficit.     Deep Tendon Reflexes: Reflexes are normal and symmetric.  Psychiatric:        Behavior: Behavior normal.        Thought Content: Thought content normal.        Judgment: Judgment normal.          BP (!) 142/69   Pulse 90   Temp 98.2 F (36.8 C) (Temporal)   Ht 5\' 6"  (1.676 m)   Wt 171 lb (77.6 kg)   SpO2 92%   BMI 27.60 kg/m   Assessment & Plan:  Enzio Buchler comes in today with chief complaint of Shortness of Breath   Diagnosis and orders addressed:  1. Acute bronchitis with COPD ([place]) - CBC with Differential/Platelet  2. Bronchitis due to tobacco use - Take meds as prescribed - Use a cool mist humidifier  -Use saline nose sprays frequently -Force fluids -For any cough or congestion  Use plain Mucinex- regular strength or max strength is fine -For fever or aces or pains-  take tylenol or ibuprofen. -Throat lozenges if help -RTO if symptoms worsen ro do not improve  - doxycycline (VIBRA-TABS) 100 MG tablet; Take 1 tablet (100 mg total) by mouth 2 (two) times daily.  Dispense: 20 tablet; Refill: 0 - predniSONE (STERAPRED UNI-PAK 21 TAB) 10 MG (21) TBPK tablet; Use as directed  Dispense: 21 tablet; Refill: 0 - albuterol (VENTOLIN HFA) 108 (90 Base) MCG/ACT inhaler; Inhale 2 puffs into the lungs every 4 (four) hours as needed for wheezing or shortness of breath.  Dispense: 8 g; Refill: 6 - CBC with Differential/Platelet  3. Anemia, unspecified type Labs pending      Evelina Dun, FNP

## 2020-08-10 ENCOUNTER — Telehealth: Payer: Self-pay

## 2020-08-10 ENCOUNTER — Ambulatory Visit (INDEPENDENT_AMBULATORY_CARE_PROVIDER_SITE_OTHER): Payer: Medicare Other | Admitting: Family

## 2020-08-10 ENCOUNTER — Encounter: Payer: Self-pay | Admitting: Family

## 2020-08-10 DIAGNOSIS — R609 Edema, unspecified: Secondary | ICD-10-CM | POA: Diagnosis not present

## 2020-08-10 DIAGNOSIS — I110 Hypertensive heart disease with heart failure: Secondary | ICD-10-CM | POA: Diagnosis not present

## 2020-08-10 DIAGNOSIS — Z48812 Encounter for surgical aftercare following surgery on the circulatory system: Secondary | ICD-10-CM | POA: Diagnosis not present

## 2020-08-10 DIAGNOSIS — R0602 Shortness of breath: Secondary | ICD-10-CM

## 2020-08-10 DIAGNOSIS — J9611 Chronic respiratory failure with hypoxia: Secondary | ICD-10-CM | POA: Diagnosis not present

## 2020-08-10 DIAGNOSIS — I509 Heart failure, unspecified: Secondary | ICD-10-CM | POA: Diagnosis not present

## 2020-08-10 DIAGNOSIS — Z95820 Peripheral vascular angioplasty status with implants and grafts: Secondary | ICD-10-CM | POA: Diagnosis not present

## 2020-08-10 DIAGNOSIS — J449 Chronic obstructive pulmonary disease, unspecified: Secondary | ICD-10-CM | POA: Diagnosis not present

## 2020-08-10 DIAGNOSIS — E1151 Type 2 diabetes mellitus with diabetic peripheral angiopathy without gangrene: Secondary | ICD-10-CM | POA: Diagnosis not present

## 2020-08-10 DIAGNOSIS — E1165 Type 2 diabetes mellitus with hyperglycemia: Secondary | ICD-10-CM | POA: Diagnosis not present

## 2020-08-10 DIAGNOSIS — N401 Enlarged prostate with lower urinary tract symptoms: Secondary | ICD-10-CM | POA: Diagnosis not present

## 2020-08-10 DIAGNOSIS — I4892 Unspecified atrial flutter: Secondary | ICD-10-CM | POA: Diagnosis not present

## 2020-08-10 MED ORDER — FUROSEMIDE 20 MG PO TABS: ORAL_TABLET | ORAL | 2 refills | Status: DC

## 2020-08-10 NOTE — Telephone Encounter (Signed)
Spoke with patient today. Will increase Lasix to 40 AM and 20 mg afternoon.

## 2020-08-10 NOTE — Progress Notes (Signed)
Virtual Visit  Note Due to COVID-19 pandemic this visit was conducted virtually. This visit type was conducted due to national recommendations for restrictions regarding the COVID-19 Pandemic (e.g. social distancing, sheltering in place) in an effort to limit this patient's exposure and mitigate transmission in our community. All issues noted in this document were discussed and addressed.  A physical exam was not performed with this format.  I connected with John [name] on 08/10/20 at 3:20 pm by telephone and verified that I am speaking with the correct person using two identifiers. John [name] is currently located at home and no one is currently with him during visit. The provider, Evelina Dun, FNP is located in their office at time of visit.  I discussed the limitations, risks, security and privacy concerns of performing an evaluation and management service by telephone and the availability of in person appointments. I also discussed with the patient that there may be a patient responsible charge related to this service. The patient expressed understanding and agreed to proceed.   History and Present Illness:  Pt calls the office today with continued SOB. He states his [facility] care nurse came to his home today and states she noticed increased swelling in his ankles.   He reports he has 3 lbs over the last week.  Shortness of Breath This is a new problem. The current episode started more than 1 month ago. The problem has been waxing and waning. Associated symptoms include leg swelling. Pertinent negatives include no chest pain, ear pain, fever, headaches, sputum production or wheezing. He has tried rest for the symptoms. The treatment provided no relief.  Congestive Heart Failure Presents for follow-up visit. Associated symptoms include edema, fatigue and shortness of breath. Pertinent negatives include no chest pain. The symptoms have been stable.      Review of Systems   Constitutional: Positive for fatigue. Negative for fever.  HENT: Negative for ear pain.   Respiratory: Positive for shortness of breath. Negative for sputum production and wheezing.   Cardiovascular: Positive for leg swelling. Negative for chest pain.  Neurological: Negative for headaches.     Observations/Objective: No SOB or distress noted  Assessment and Plan: 1. Congestive heart failure, unspecified HF chronicity, unspecified heart failure type (HCC) - furosemide (LASIX) 20 MG tablet; Take 40 mg in AM and 20 mg in afternoon  Dispense: 180 tablet; Refill: 2  2. SOB (shortness of breath)  3. Peripheral edema  - furosemide (LASIX) 20 MG tablet; Take 40 mg in AM and 20 mg in afternoon  Dispense: 180 tablet; Refill: 2   Increase Lasix to 40 mg in AM and 20 mg in afternoon from 20 mg BID Low salt diet Keep Feet elevated when possible  Weigh yourself daily Red flags discussed    I discussed the assessment and treatment plan with the patient. The patient was provided an opportunity to ask questions and all were answered. The patient agreed with the plan and demonstrated an understanding of the instructions.   The patient was advised to call back or seek an in-person evaluation if the symptoms worsen or if the condition fails to improve as anticipated.  The above assessment and management plan was discussed with the patient. The patient verbalized understanding of and has agreed to the management plan. Patient is aware to call the clinic if symptoms persist or worsen. Patient is aware when to return to the clinic for a follow-up visit. Patient educated on when it is appropriate to go  to the emergency department.   Time call ended:  3:33 pm   I provided 13 minutes of  non face-to-face time during this encounter.    Evelina Dun, FNP

## 2020-08-10 NOTE — Telephone Encounter (Signed)
Colletta Maryland, nurse from [facility], calling to let you know that she saw John [name] on Tuesday and again today.  Today he has 1+ pitting edema in both ankles, ankles have increased by 1 cm in each and has increased shortness of breath.  She would like to know if you recommend more Lasix or what your recommendation is.

## 2020-08-11 ENCOUNTER — Other Ambulatory Visit: Payer: Self-pay | Admitting: Family

## 2020-08-11 DIAGNOSIS — E1142 Type 2 diabetes mellitus with diabetic polyneuropathy: Secondary | ICD-10-CM

## 2020-08-13 ENCOUNTER — Inpatient Hospital Stay (HOSPITAL_COMMUNITY): Payer: Medicare Other | Attending: Hematology

## 2020-08-13 ENCOUNTER — Telehealth: Payer: Self-pay

## 2020-08-13 ENCOUNTER — Encounter (HOSPITAL_COMMUNITY)
Admission: RE | Admit: 2020-08-13 | Discharge: 2020-08-13 | Disposition: A | Discharge status: Home / Self Care | Payer: Medicare Other | Source: Ambulatory Visit | Attending: Hematology | Admitting: Hematology

## 2020-08-13 ENCOUNTER — Other Ambulatory Visit: Payer: Self-pay

## 2020-08-13 DIAGNOSIS — E039 Hypothyroidism, unspecified: Secondary | ICD-10-CM | POA: Insufficient documentation

## 2020-08-13 DIAGNOSIS — R918 Other nonspecific abnormal finding of lung field: Secondary | ICD-10-CM | POA: Diagnosis not present

## 2020-08-13 DIAGNOSIS — C3491 Malignant neoplasm of unspecified part of right bronchus or lung: Secondary | ICD-10-CM

## 2020-08-13 DIAGNOSIS — C3490 Malignant neoplasm of unspecified part of unspecified bronchus or lung: Secondary | ICD-10-CM | POA: Diagnosis not present

## 2020-08-13 DIAGNOSIS — Z85118 Personal history of other malignant neoplasm of bronchus and lung: Secondary | ICD-10-CM | POA: Insufficient documentation

## 2020-08-13 LAB — CBC WITH DIFFERENTIAL/PLATELET
Abs Immature Granulocytes: 0.24 10*3/uL — ABNORMAL HIGH (ref 0.00–0.07)
Basophils Absolute: 0.1 10*3/uL (ref 0.0–0.1)
Basophils Relative: 0 %
Eosinophils Absolute: 0.1 10*3/uL (ref 0.0–0.5)
Eosinophils Relative: 1 %
HCT: 43.7 % (ref 39.0–52.0)
Hemoglobin: 12.4 g/dL — ABNORMAL LOW (ref 13.0–17.0)
Immature Granulocytes: 1 %
Lymphocytes Relative: 3 %
Lymphs Abs: 0.5 10*3/uL — ABNORMAL LOW (ref 0.7–4.0)
MCH: 24.7 pg — ABNORMAL LOW (ref 26.0–34.0)
MCHC: 28.4 g/dL — ABNORMAL LOW (ref 30.0–36.0)
MCV: 87.1 fL (ref 80.0–100.0)
Monocytes Absolute: 0.7 10*3/uL (ref 0.1–1.0)
Monocytes Relative: 3 %
Neutro Abs: 18 10*3/uL — ABNORMAL HIGH (ref 1.7–7.7)
Neutrophils Relative %: 92 %
Platelets: 444 10*3/uL — ABNORMAL HIGH (ref 150–400)
RBC: 5.02 MIL/uL (ref 4.22–5.81)
RDW: 17.2 % — ABNORMAL HIGH (ref 11.5–15.5)
WBC: 19.6 10*3/uL — ABNORMAL HIGH (ref 4.0–10.5)
nRBC: 0 % (ref 0.0–0.2)

## 2020-08-13 LAB — COMPREHENSIVE METABOLIC PANEL
ALT: 34 U/L (ref 0–44)
AST: 19 U/L (ref 15–41)
Albumin: 3.6 g/dL (ref 3.5–5.0)
Alkaline Phosphatase: 95 U/L (ref 38–126)
Anion gap: 11 (ref 5–15)
BUN: 26 mg/dL — ABNORMAL HIGH (ref 8–23)
CO2: 33 mmol/L — ABNORMAL HIGH (ref 22–32)
Calcium: 9.1 mg/dL (ref 8.9–10.3)
Chloride: 92 mmol/L — ABNORMAL LOW (ref 98–111)
Creatinine, Ser: 0.92 mg/dL (ref 0.61–1.24)
GFR, Estimated: >60 mL/min (ref >60)
Glucose, Bld: 203 mg/dL — ABNORMAL HIGH (ref 70–99)
Potassium: 3.9 mmol/L (ref 3.5–5.1)
Sodium: 136 mmol/L (ref 135–145)
Total Bilirubin: 0.9 mg/dL (ref 0.3–1.2)
Total Protein: 6.9 g/dL (ref 6.5–8.1)

## 2020-08-13 LAB — TSH: TSH: 1.542 u[IU]/mL (ref 0.350–4.500)

## 2020-08-13 MED ORDER — FLUDEOXYGLUCOSE F - 18 (FDG) INJECTION
10.1100 | Freq: Once | INTRAVENOUS | Status: AC | PRN
  Administered 2020-08-13: 10.11 via INTRAVENOUS

## 2020-08-13 NOTE — Telephone Encounter (Signed)
Constipated yesterday, diarrhea last night and this am. Seen blood after having diarrhea. Seen with wiping and in toilet. The blood is bright red. He does have hemorrhoids but they are not flared. Pt has abdominal pain just above the navel.  Pt does have some nausea as well.

## 2020-08-13 NOTE — Telephone Encounter (Signed)
Lasix should not cause blood in stool. If he is still having abdominal pain and blood he needs to be seen.

## 2020-08-13 NOTE — Progress Notes (Signed)
Surgical Instructions    Your procedure is scheduled on 08/20/20.  Report to [facility] Main Entrance "A" at 05:30 A.M., then check in with the Admitting office.  Call this number if you have problems the morning of surgery:  [phone]   If you have any questions prior to your surgery date call [phone]: Open Monday-Friday 8am-4pm    Remember:  Do not eat or drink after midnight the night before your surgery    Take these medicines the morning of surgery with A SIP OF WATER  acetaminophen (TYLENOL) if needed albuterol (VENTOLIN HFA) if needed cloNIDine (CATAPRES) doxycycline (VIBRA-TABS) fluticasone (FLONASE) gabapentin (NEURONTIN)  levothyroxine (SYNTHROID) metoprolol succinate (TOPROL-XL) ondansetron (ZOFRAN-ODT) if needed predniSONE (DELTASONE) TRELEGY ELLIPTA  As of today, STOP taking any  (unless otherwise instructed by your surgeon) Aleve, Naproxen, Ibuprofen, Motrin, Advil, Goody's, BC's, all herbal medications, fish oil, and all vitamins. This includes stopping your meloxicam (MOBIC).  Continue taking your aspirin.   WHAT DO I DO ABOUT MY DIABETES MEDICATION?   Marland Kitchen Do not take oral diabetes medicines (pills):  the morning of surgery.       . THE MORNING OF SURGERY, do not take Semaglutide (RYBELSUS) or metFORMIN (GLUCOPHAGE-XR).  Marland Kitchen The day of surgery, do not take other diabetes injectables, including Byetta (exenatide), Bydureon (exenatide ER), Victoza (liraglutide), or Trulicity (dulaglutide).   HOW TO MANAGE YOUR DIABETES BEFORE AND AFTER SURGERY  Why is it important to control my blood sugar before and after surgery? . Improving blood sugar levels before and after surgery helps healing and can limit problems. . A way of improving blood sugar control is eating a healthy diet by: o  Eating less sugar and carbohydrates o  Increasing activity/exercise o  Talking with your doctor about reaching your blood sugar goals . High blood sugars (greater than 180  mg/dL) can raise your risk of infections and slow your recovery, so you will need to focus on controlling your diabetes during the weeks before surgery. . Make sure that the doctor who takes care of your diabetes knows about your planned surgery including the date and location.  How do I manage my blood sugar before surgery? . Check your blood sugar at least 4 times a day, starting 2 days before surgery, to make sure that the level is not too high or low. o Check your blood sugar the morning of your surgery when you wake up and every 2 hours until you get to the Short Stay unit. . If your blood sugar is less than 70 mg/dL, you will need to treat for low blood sugar: o Do not take insulin. o Treat a low blood sugar (less than 70 mg/dL) with  cup of clear juice (cranberry or apple), 4 glucose tablets, OR glucose gel. Recheck blood sugar in 15 minutes after treatment (to make sure it is greater than 70 mg/dL). If your blood sugar is not greater than 70 mg/dL on recheck, call [phone] o  for further instructions. . Report your blood sugar to the short stay nurse when you get to Short Stay.  . If you are admitted to the hospital after surgery: o Your blood sugar will be checked by the staff and you will probably be given insulin after surgery (instead of oral diabetes medicines) to make sure you have good blood sugar levels. o The goal for blood sugar control after surgery is 80-180 mg/dL.  Do not wear jewelry, make up, or nail polish            Do not wear lotions, powders, colognes, or deodorant.            Men may shave face and neck.            Do not bring valuables to the hospital.            [facility] is not responsible for any belongings or valuables.  Do NOT Smoke (Tobacco/Vaping) or drink Alcohol 24 hours prior to your procedure If you use a CPAP at night, you may bring all equipment for your overnight stay.   Contacts, glasses, dentures or bridgework  may not be worn into surgery, please bring cases for these belongings   For patients admitted to the hospital, discharge time will be determined by your treatment team.   Patients discharged the day of surgery will not be allowed to drive home, and someone needs to stay with them for 24 hours.    Special instructions:   [place]- Preparing For Surgery  Before surgery, you can play an important role. Because skin is not sterile, your skin needs to be as free of germs as possible. You can reduce the number of germs on your skin by washing with CHG (chlorahexidine gluconate) Soap before surgery.  CHG is an antiseptic cleaner which kills germs and bonds with the skin to continue killing germs even after washing.    Oral Hygiene is also important to reduce your risk of infection.  Remember - BRUSH YOUR TEETH THE MORNING OF SURGERY WITH YOUR REGULAR TOOTHPASTE  Please do not use if you have an allergy to CHG or antibacterial soaps. If your skin becomes reddened/irritated stop using the CHG.  Do not shave (including legs and underarms) for at least 48 hours prior to first CHG shower. It is OK to shave your face.  Please follow these instructions carefully.   1. Shower the NIGHT BEFORE SURGERY and the MORNING OF SURGERY  2. If you chose to wash your hair, wash your hair first as usual with your normal shampoo.  3. After you shampoo, rinse your hair and body thoroughly to remove the shampoo.  4. Wash Face and genitals (private parts) with your normal soap.   5.  Shower the NIGHT BEFORE SURGERY and the MORNING OF SURGERY with CHG Soap.   6. Use CHG Soap as you would any other liquid soap. You can apply CHG directly to the skin and wash gently with a scrungie or a clean washcloth.   7. Apply the CHG Soap to your body ONLY FROM THE NECK DOWN.  Do not use on open wounds or open sores. Avoid contact with your eyes, ears, mouth and genitals (private parts). Wash Face and genitals (private parts)   with your normal soap.   8. Wash thoroughly, paying special attention to the area where your surgery will be performed.  9. Thoroughly rinse your body with warm water from the neck down.  10. DO NOT shower/wash with your normal soap after using and rinsing off the CHG Soap.  11. Pat yourself dry with a CLEAN TOWEL.  12. Wear CLEAN PAJAMAS to bed the night before surgery  13. Place CLEAN SHEETS on your bed the night before your surgery  14. DO NOT SLEEP WITH PETS.   Day of Surgery: Take a shower with CHG soap. Wear Clean/Comfortable clothing the morning of surgery Do not apply any  deodorants/lotions.   Remember to brush your teeth WITH YOUR REGULAR TOOTHPASTE.   Please read over the following fact sheets that you were given.

## 2020-08-13 NOTE — Telephone Encounter (Signed)
Pt states that he is feeling some better since this morning.  After hours appt scheduled for 08/14/20 at 4:45pm. Pt will call and cancel if appt no longer needed.

## 2020-08-14 ENCOUNTER — Encounter: Payer: Self-pay | Admitting: Family Medicine

## 2020-08-14 ENCOUNTER — Other Ambulatory Visit: Payer: Self-pay

## 2020-08-14 ENCOUNTER — Encounter (HOSPITAL_COMMUNITY): Payer: Self-pay

## 2020-08-14 ENCOUNTER — Ambulatory Visit (HOSPITAL_COMMUNITY)
Admission: RE | Admit: 2020-08-14 | Discharge: 2020-08-14 | Disposition: A | Discharge status: Home / Self Care | Payer: Medicare Other | Source: Ambulatory Visit | Attending: Anesthesiology | Admitting: Anesthesiology

## 2020-08-14 ENCOUNTER — Ambulatory Visit (INDEPENDENT_AMBULATORY_CARE_PROVIDER_SITE_OTHER): Payer: Medicare Other | Admitting: Family Medicine

## 2020-08-14 ENCOUNTER — Encounter (HOSPITAL_COMMUNITY)
Admission: RE | Admit: 2020-08-14 | Discharge: 2020-08-14 | Disposition: A | Discharge status: Home / Self Care | Payer: Medicare Other | Source: Ambulatory Visit | Attending: Vascular Surgery | Admitting: Vascular Surgery

## 2020-08-14 ENCOUNTER — Ambulatory Visit: Payer: Medicare Other | Admitting: Family Medicine

## 2020-08-14 ENCOUNTER — Ambulatory Visit (INDEPENDENT_AMBULATORY_CARE_PROVIDER_SITE_OTHER): Payer: Medicare Other

## 2020-08-14 VITALS — BP 115/64 | HR 90 | Temp 97.9°F | Ht 66.0 in | Wt 161.2 lb

## 2020-08-14 DIAGNOSIS — Z79890 Hormone replacement therapy (postmenopausal): Secondary | ICD-10-CM | POA: Insufficient documentation

## 2020-08-14 DIAGNOSIS — Z7984 Long term (current) use of oral hypoglycemic drugs: Secondary | ICD-10-CM | POA: Insufficient documentation

## 2020-08-14 DIAGNOSIS — Z7982 Long term (current) use of aspirin: Secondary | ICD-10-CM | POA: Diagnosis not present

## 2020-08-14 DIAGNOSIS — G4733 Obstructive sleep apnea (adult) (pediatric): Secondary | ICD-10-CM | POA: Diagnosis not present

## 2020-08-14 DIAGNOSIS — R1084 Generalized abdominal pain: Secondary | ICD-10-CM

## 2020-08-14 DIAGNOSIS — I2510 Atherosclerotic heart disease of native coronary artery without angina pectoris: Secondary | ICD-10-CM

## 2020-08-14 DIAGNOSIS — Z87891 Personal history of nicotine dependence: Secondary | ICD-10-CM | POA: Diagnosis not present

## 2020-08-14 DIAGNOSIS — Z01818 Encounter for other preprocedural examination: Secondary | ICD-10-CM | POA: Insufficient documentation

## 2020-08-14 DIAGNOSIS — I4892 Unspecified atrial flutter: Secondary | ICD-10-CM | POA: Diagnosis not present

## 2020-08-14 DIAGNOSIS — Z95820 Peripheral vascular angioplasty status with implants and grafts: Secondary | ICD-10-CM | POA: Diagnosis not present

## 2020-08-14 DIAGNOSIS — J9611 Chronic respiratory failure with hypoxia: Secondary | ICD-10-CM | POA: Diagnosis not present

## 2020-08-14 DIAGNOSIS — E1151 Type 2 diabetes mellitus with diabetic peripheral angiopathy without gangrene: Secondary | ICD-10-CM | POA: Diagnosis not present

## 2020-08-14 DIAGNOSIS — I509 Heart failure, unspecified: Secondary | ICD-10-CM | POA: Diagnosis not present

## 2020-08-14 DIAGNOSIS — I110 Hypertensive heart disease with heart failure: Secondary | ICD-10-CM | POA: Diagnosis not present

## 2020-08-14 DIAGNOSIS — E785 Hyperlipidemia, unspecified: Secondary | ICD-10-CM | POA: Insufficient documentation

## 2020-08-14 DIAGNOSIS — R911 Solitary pulmonary nodule: Secondary | ICD-10-CM | POA: Diagnosis not present

## 2020-08-14 DIAGNOSIS — Z48812 Encounter for surgical aftercare following surgery on the circulatory system: Secondary | ICD-10-CM | POA: Diagnosis not present

## 2020-08-14 DIAGNOSIS — E039 Hypothyroidism, unspecified: Secondary | ICD-10-CM | POA: Diagnosis not present

## 2020-08-14 DIAGNOSIS — E1165 Type 2 diabetes mellitus with hyperglycemia: Secondary | ICD-10-CM | POA: Diagnosis not present

## 2020-08-14 DIAGNOSIS — R109 Unspecified abdominal pain: Secondary | ICD-10-CM | POA: Diagnosis not present

## 2020-08-14 DIAGNOSIS — J449 Chronic obstructive pulmonary disease, unspecified: Secondary | ICD-10-CM | POA: Insufficient documentation

## 2020-08-14 DIAGNOSIS — Z79899 Other long term (current) drug therapy: Secondary | ICD-10-CM | POA: Insufficient documentation

## 2020-08-14 DIAGNOSIS — N401 Enlarged prostate with lower urinary tract symptoms: Secondary | ICD-10-CM | POA: Diagnosis not present

## 2020-08-14 LAB — COMPREHENSIVE METABOLIC PANEL
ALT: 30 U/L (ref 0–44)
AST: 22 U/L (ref 15–41)
Albumin: 3.4 g/dL — ABNORMAL LOW (ref 3.5–5.0)
Alkaline Phosphatase: 104 U/L (ref 38–126)
Anion gap: 8 (ref 5–15)
BUN: 25 mg/dL — ABNORMAL HIGH (ref 8–23)
CO2: 35 mmol/L — ABNORMAL HIGH (ref 22–32)
Calcium: 8.9 mg/dL (ref 8.9–10.3)
Chloride: 93 mmol/L — ABNORMAL LOW (ref 98–111)
Creatinine, Ser: 1.15 mg/dL (ref 0.61–1.24)
GFR, Estimated: >60 mL/min (ref >60)
Glucose, Bld: 232 mg/dL — ABNORMAL HIGH (ref 70–99)
Potassium: 3.2 mmol/L — ABNORMAL LOW (ref 3.5–5.1)
Sodium: 136 mmol/L (ref 135–145)
Total Bilirubin: 0.4 mg/dL (ref 0.3–1.2)
Total Protein: 6.2 g/dL — ABNORMAL LOW (ref 6.5–8.1)

## 2020-08-14 LAB — TYPE AND SCREEN
ABO/RH(D): O POS
Antibody Screen: NEGATIVE

## 2020-08-14 LAB — CBC
HCT: 41.3 % (ref 39.0–52.0)
Hemoglobin: 12.1 g/dL — ABNORMAL LOW (ref 13.0–17.0)
MCH: 24.8 pg — ABNORMAL LOW (ref 26.0–34.0)
MCHC: 29.3 g/dL — ABNORMAL LOW (ref 30.0–36.0)
MCV: 84.8 fL (ref 80.0–100.0)
Platelets: 464 10*3/uL — ABNORMAL HIGH (ref 150–400)
RBC: 4.87 MIL/uL (ref 4.22–5.81)
RDW: 17.2 % — ABNORMAL HIGH (ref 11.5–15.5)
WBC: 20.7 10*3/uL — ABNORMAL HIGH (ref 4.0–10.5)
nRBC: 0 % (ref 0.0–0.2)

## 2020-08-14 LAB — GLUCOSE, CAPILLARY: Glucose-Capillary: 234 mg/dL — ABNORMAL HIGH (ref 70–99)

## 2020-08-14 LAB — URINALYSIS, ROUTINE W REFLEX MICROSCOPIC
Bilirubin Urine: NEGATIVE
Glucose, UA: NEGATIVE mg/dL
Hgb urine dipstick: NEGATIVE
Ketones, ur: NEGATIVE mg/dL
Leukocytes,Ua: NEGATIVE
Nitrite: NEGATIVE
Protein, ur: NEGATIVE mg/dL
Specific Gravity, Urine: 1.008 (ref 1.005–1.030)
pH: 7 (ref 5.0–8.0)

## 2020-08-14 LAB — APTT: aPTT: 26 seconds (ref 24–36)

## 2020-08-14 LAB — PROTIME-INR
INR: 1 (ref 0.8–1.2)
Prothrombin Time: 13.5 seconds (ref 11.4–15.2)

## 2020-08-14 LAB — SURGICAL PCR SCREEN
MRSA, PCR: NEGATIVE
Staphylococcus aureus: NEGATIVE

## 2020-08-14 MED ORDER — LINACLOTIDE 145 MCG PO CAPS: 145.0000 ug | ORAL_CAPSULE | Freq: Every day | ORAL | 5 refills | Status: DC

## 2020-08-14 NOTE — Progress Notes (Signed)
PCP - Evelina Dun Cardiologist - Bernerd Pho PA   Chest x-ray - 08/14/20 EKG - 06/04/20 Stress Test - 03/12/18 ECHO - 01/15/18 Cardiac Cath - denies  Sleep Study - 2017 CPAP - at night, wears every night, instructed to bring DOS for recovery  Fasting Blood Sugar - 123-133 Checks Blood Sugar __2___ times a day  Aspirin Instructions:  continue   COVID TEST- Friday    Anesthesia review: yes, patient stated that he was dx with bronchitis, xray completed today pt cx of cough with congestion, more than normal   Patient denies shortness of breath, fever, cough and chest pain at PAT appointment   All instructions explained to the patient, with a verbal understanding of the material. Patient agrees to go over the instructions while at home for a better understanding. Patient also instructed to self quarantine after being tested for COVID-19. The opportunity to ask questions was provided.

## 2020-08-14 NOTE — Progress Notes (Signed)
Subjective:  Patient ID: John [name], male    DOB: September 21, 1945  Age: 75 y.o. MRN: [id number]  CC: Abdominal Pain   HPI John [name] presents for constipation for several days.  2 days ago he developed rather severe abdominal pain.  After a few hours he had a large bowel movement.  This was followed by multiple loose bowel movements.  His pain went away then.  He has had multiple bowel movements since then that were small pellet-like stools.  Nothing loose.  He is not having any pain.  He did pass some blood at first when he had the large bowel movement at onset.  Currently has no fever chills or sweats.  No chest pain.  Appetite is good.  Still feeling bloated. Frequently nauseated.  Depression screen [facility] 2/9 08/14/2020 08/08/2020 08/03/2020  Decreased Interest 0 0 0  Down, Depressed, Hopeless - 0 0  PHQ - 2 Score 0 0 0  Altered sleeping - - -  Tired, decreased energy - - -  Change in appetite - - -  Feeling bad or failure about yourself  - - -  Trouble concentrating - - -  Moving slowly or fidgety/restless - - -  Suicidal thoughts - - -  PHQ-9 Score - - -  Some recent data might be hidden    History John [name] has a past medical history of Adenocarcinoma of lung, right ([place]) (04/18/2016), Anxiety, Arthritis, Asthma, BPH (benign prostatic hyperplasia), CHF (congestive heart failure) ([place]), COPD (chronic obstructive pulmonary disease) ([place]), Depression, Diabetes mellitus without complication ([place]), Diabetes mellitus, type II ([place]), DM (diabetes mellitus) ([place]) (07/09/2016), Dyspnea, History of kidney stones, Hyperlipidemia, Hypertension, Hypertension, Hypothyroidism, Macular degeneration, Neuropathy, Non-small cell lung cancer, right ([place]) (04/18/2016), Peripheral vascular disease ([place]), Prostatitis, Pulmonary nodule, left (07/16/2016), and Sleep apnea.   He has a past surgical history that includes Video bronchoscopy with endobronchial navigation (N/A, 05/28/2016); Video bronchoscopy with  endobronchial ultrasound (N/A, 05/28/2016); Cataract extraction, bilateral (Bilateral); Portacath placement (Left, 06/13/2016); ABDOMINAL AORTOGRAM W/LOWER EXTREMITY (Left, 02/06/2020); and Transurethral resection of prostate (N/A, 05/31/2020).   His family history includes Diabetes in his father; Heart disease in his father; Hypertension in his mother and sister; Stroke in his father.He reports that he has been smoking cigarettes. He started smoking about 59 years ago. He has a 27.50 pack-year smoking history. He has never used smokeless tobacco. He reports that he does not drink alcohol and does not use drugs.    ROS Review of Systems  Constitutional: Negative for chills, diaphoresis, fever and unexpected weight change.  HENT: Negative for rhinorrhea and trouble swallowing.   Respiratory: Negative for cough, chest tightness and shortness of breath.   Cardiovascular: Negative for chest pain.  Gastrointestinal: Positive for abdominal pain, blood in stool (Stools are dark), constipation and diarrhea. Negative for abdominal distention, nausea, rectal pain and vomiting.  Genitourinary: Negative for dysuria, flank pain and hematuria.  Musculoskeletal: Negative for arthralgias and joint swelling.  Skin: Negative for rash.  Neurological: Negative for syncope and headaches.    Objective:  BP 115/64   Pulse 90   Temp 97.9 F (36.6 C)   Ht $R'5\' 6"'bq$  (1.676 m)   Wt 161 lb 3.2 oz (73.1 kg)   SpO2 98%   BMI 26.02 kg/m   BP Readings from Last 3 Encounters:  08/14/20 115/64  08/14/20 (!) 120/55  08/08/20 (!) 142/69    Wt Readings from Last 3 Encounters:  08/14/20 161 lb 3.2 oz (73.1 kg)  08/14/20  162 lb 11.2 oz (73.8 kg)  08/08/20 171 lb (77.6 kg)     Physical Exam Vitals reviewed.  Constitutional:      Appearance: He is well-developed.  HENT:     Head: Normocephalic and atraumatic.     Right Ear: Tympanic membrane and external ear normal. No decreased hearing noted.     Left Ear: Tympanic  membrane and external ear normal. No decreased hearing noted.     Mouth/Throat:     Pharynx: No oropharyngeal exudate or posterior oropharyngeal erythema.  Eyes:     Pupils: Pupils are equal, round, and reactive to light.  Cardiovascular:     Rate and Rhythm: Normal rate and regular rhythm.     Heart sounds: No murmur heard.   Pulmonary:     Effort: No respiratory distress.     Breath sounds: Normal breath sounds.  Abdominal:     General: Bowel sounds are normal. There is distension.     Palpations: Abdomen is soft. There is no shifting dullness, hepatomegaly, splenomegaly or mass.     Tenderness: There is abdominal tenderness in the left upper quadrant and left lower quadrant. There is no guarding or rebound. Negative signs include Murphy's sign, McBurney's sign and psoas sign.  Musculoskeletal:     Cervical back: Normal range of motion and neck supple.    Abd XR - increased stool burden Preliminary reading done by John [name]     Assessment & Plan:   John [name] was seen today for abdominal pain.  Diagnoses and all orders for this visit:  Generalized abdominal pain -     DG Abd 2 Views; Future -     CMP14+EGFR -     CBC with Differential/Platelet -     Lipase -     Amylase  Other orders -     linaclotide (LINZESS) 145 MCG CAPS capsule; Take 1 capsule (145 mcg total) by mouth daily. To regulate bowel movements       I have discontinued John [name]. John [name]'s linaclotide. I am also having him start on linaclotide. Additionally, I am having him maintain his aspirin EC, OneTouch Verio Reflect, OneTouch Verio, OneTouch Delica Lancets 34V, Rybelsus, meloxicam, metFORMIN, dutasteride, pravastatin, metoprolol succinate, bacitracin, levothyroxine, polyethylene glycol, nystatin, cloNIDine, clotrimazole-betamethasone, ondansetron, temazepam, Uro-MP, losartan, fluticasone, doxycycline, albuterol, furosemide, Trelegy Ellipta, gabapentin, predniSONE, and acetaminophen.  Allergies  as of 08/14/2020      Reactions   Jardiance [empagliflozin] Other (See Comments)   FEELS SLUGGISH, TIRED   Lopressor [metoprolol] Other (See Comments)   Fatigue   Sulfa Antibiotics Swelling   Mouth swelling      Medication List       Accurate as of Aug 14, 2020  4:10 PM. If you have any questions, ask your nurse or doctor.        acetaminophen 650 MG CR tablet Commonly known as: TYLENOL Take 650 mg by mouth every 8 (eight) hours as needed for pain.   albuterol 108 (90 Base) MCG/ACT inhaler Commonly known as: VENTOLIN HFA Inhale 2 puffs into the lungs every 4 (four) hours as needed for wheezing or shortness of breath.   aspirin EC 81 MG tablet Take 81 mg by mouth daily.   bacitracin 500 UNIT/GM ointment Apply 1 application topically 2 (two) times daily.   cloNIDine 0.1 MG tablet Commonly known as: CATAPRES TAKE 1 TABLET (0.1 MG TOTAL) BY MOUTH 3 (THREE) TIMES DAILY.   clotrimazole-betamethasone cream Commonly known as: Lotrisone Apply 1 application topically 2 (two)  times daily. What changed:   when to take this  reasons to take this   doxycycline 100 MG tablet Commonly known as: VIBRA-TABS Take 1 tablet (100 mg total) by mouth 2 (two) times daily.   dutasteride 0.5 MG capsule Commonly known as: AVODART TAKE 1 CAPSULE BY MOUTH EVERY DAY   fluticasone 50 MCG/ACT nasal spray Commonly known as: FLONASE Place 1 spray into both nostrils daily as needed for allergies or rhinitis. What changed: when to take this   furosemide 20 MG tablet Commonly known as: LASIX Take 40 mg in AM and 20 mg in afternoon What changed:   how much to take  how to take this  when to take this  additional instructions   gabapentin 300 MG capsule Commonly known as: NEURONTIN TAKE 1 CAPSULE BY MOUTH THREE TIMES A DAY What changed: See the new instructions.   levothyroxine 50 MCG tablet Commonly known as: SYNTHROID Take 1 tablet (50 mcg total) by mouth daily.   linaclotide  145 MCG Caps capsule Commonly known as: Linzess Take 1 capsule (145 mcg total) by mouth daily. To regulate bowel movements What changed:   medication strength  how much to take  when to take this  additional instructions Changed by: Claretta Fraise, MD   losartan 50 MG tablet Commonly known as: COZAAR Take 1 tablet (50 mg total) by mouth daily.   meloxicam 7.5 MG tablet Commonly known as: MOBIC Take 1 tablet (7.5 mg total) by mouth daily as needed for pain (back pain).   metFORMIN 500 MG 24 hr tablet Commonly known as: GLUCOPHAGE-XR Take 1 tablet (500 mg total) by mouth daily with breakfast.   metoprolol succinate 25 MG 24 hr tablet Commonly known as: TOPROL-XL Take 1.5 tablets (37.5 mg total) by mouth daily. Take with or immediately following a meal. What changed:   when to take this  reasons to take this   nystatin 100000 UNIT/ML suspension Commonly known as: MYCOSTATIN Take 5 mLs (500,000 Units total) by mouth 4 (four) times daily.   ondansetron 4 MG disintegrating tablet Commonly known as: ZOFRAN-ODT TAKE 1 TABLET BY MOUTH EVERY 8 HOURS AS NEEDED FOR NAUSEA AND VOMITING What changed: See the new instructions.   OneTouch Delica Lancets 04H Misc Use to test blood sugars daily as directed. DX: E11.9   OneTouch Verio Reflect w/Device Kit Use to test blood sugars daily as directed. DX: E11.9   OneTouch Verio test strip Generic drug: glucose blood Use to test blood sugars daily as directed. DX: E11.9   polyethylene glycol 17 g packet Commonly known as: MIRALAX / GLYCOLAX Take 17 g by mouth daily as needed for moderate constipation or severe constipation.   pravastatin 40 MG tablet Commonly known as: PRAVACHOL TAKE 1 TABLET (40 MG TOTAL) BY MOUTH ON MONDAYS , WEDNESDAYS, AND FRIDAYS AS DIRECTED What changed: See the new instructions.   predniSONE 10 MG tablet Commonly known as: DELTASONE Take 10 mg by mouth daily with breakfast.   Rybelsus 7 MG  Tabs Generic drug: Semaglutide Take 7 mg by mouth daily.   temazepam 7.5 MG capsule Commonly known as: Restoril Take 1 capsule (7.5 mg total) by mouth at bedtime as needed for sleep.   Trelegy Ellipta 100-62.5-25 MCG/INH Aepb Generic drug: Fluticasone-Umeclidin-Vilant INHALE 1 PUFF INTO THE LUNGS DAILY   Uro-MP 118 MG Caps TAKE 1 CAPSULE (118 MG TOTAL) BY MOUTH 2 (TWO) TIMES DAILY AS NEEDED. What changed: See the new instructions.  Follow-up: Return if symptoms worsen or fail to improve.  Claretta Fraise, M.D.

## 2020-08-15 ENCOUNTER — Inpatient Hospital Stay (HOSPITAL_BASED_OUTPATIENT_CLINIC_OR_DEPARTMENT_OTHER): Payer: Medicare Other | Admitting: Hematology

## 2020-08-15 VITALS — BP 125/46 | HR 87 | Temp 96.8°F | Resp 18 | Wt 163.7 lb

## 2020-08-15 DIAGNOSIS — R918 Other nonspecific abnormal finding of lung field: Secondary | ICD-10-CM | POA: Diagnosis not present

## 2020-08-15 DIAGNOSIS — M048 Other autoinflammatory syndromes: Secondary | ICD-10-CM | POA: Diagnosis not present

## 2020-08-15 DIAGNOSIS — C3491 Malignant neoplasm of unspecified part of right bronchus or lung: Secondary | ICD-10-CM

## 2020-08-15 DIAGNOSIS — Z85118 Personal history of other malignant neoplasm of bronchus and lung: Secondary | ICD-10-CM | POA: Diagnosis not present

## 2020-08-15 DIAGNOSIS — E039 Hypothyroidism, unspecified: Secondary | ICD-10-CM | POA: Diagnosis not present

## 2020-08-15 LAB — CBC WITH DIFFERENTIAL/PLATELET
Basophils Absolute: 0 10*3/uL (ref 0.0–0.2)
Basos: 0 %
EOS (ABSOLUTE): 0.1 10*3/uL (ref 0.0–0.4)
Eos: 0 %
Hematocrit: 37.3 % — ABNORMAL LOW (ref 37.5–51.0)
Hemoglobin: 11.6 g/dL — ABNORMAL LOW (ref 13.0–17.7)
Immature Grans (Abs): 0.1 10*3/uL (ref 0.0–0.1)
Immature Granulocytes: 1 %
Lymphocytes Absolute: 0.6 10*3/uL — ABNORMAL LOW (ref 0.7–3.1)
Lymphs: 4 %
MCH: 25 pg — ABNORMAL LOW (ref 26.6–33.0)
MCHC: 31.1 g/dL — ABNORMAL LOW (ref 31.5–35.7)
MCV: 80 fL (ref 79–97)
Monocytes Absolute: 0.6 10*3/uL (ref 0.1–0.9)
Monocytes: 4 %
Neutrophils Absolute: 13.9 10*3/uL — ABNORMAL HIGH (ref 1.4–7.0)
Neutrophils: 91 %
Platelets: 424 10*3/uL (ref 150–450)
RBC: 4.64 x10E6/uL (ref 4.14–5.80)
RDW: 16 % — ABNORMAL HIGH (ref 11.6–15.4)
WBC: 15.2 10*3/uL — ABNORMAL HIGH (ref 3.4–10.8)

## 2020-08-15 LAB — CMP14+EGFR
ALT: 29 IU/L (ref 0–44)
AST: 18 IU/L (ref 0–40)
Albumin/Globulin Ratio: 1.7 (ref 1.2–2.2)
Albumin: 3.9 g/dL (ref 3.7–4.7)
Alkaline Phosphatase: 117 IU/L (ref 44–121)
BUN/Creatinine Ratio: 19 (ref 10–24)
BUN: 21 mg/dL (ref 8–27)
Bilirubin Total: 0.3 mg/dL (ref 0.0–1.2)
CO2: 31 mmol/L — ABNORMAL HIGH (ref 20–29)
Calcium: 9.4 mg/dL (ref 8.6–10.2)
Chloride: 96 mmol/L (ref 96–106)
Creatinine, Ser: 1.12 mg/dL (ref 0.76–1.27)
Globulin, Total: 2.3 g/dL (ref 1.5–4.5)
Glucose: 194 mg/dL — ABNORMAL HIGH (ref 65–99)
Potassium: 4.2 mmol/L (ref 3.5–5.2)
Sodium: 142 mmol/L (ref 134–144)
Total Protein: 6.2 g/dL (ref 6.0–8.5)
eGFR: 69 mL/min/{1.73_m2} (ref >59)

## 2020-08-15 LAB — LIPASE: Lipase: 22 U/L (ref 13–78)

## 2020-08-15 LAB — AMYLASE: Amylase: 24 U/L — ABNORMAL LOW (ref 31–110)

## 2020-08-15 NOTE — Progress Notes (Signed)
[place], [place] [address]   CLINIC:  Medical Oncology/Hematology  PCP:  John [name], [place] / [place] [address] [phone]   REASON FOR VISIT:  Follow-up for right NSCLC  PRIOR THERAPY:  1. Chemoradiation with weekly carboplatin and paclitaxel from 06/25/2016 to 08/20/2016. 2. Consolidation durvalumab from 09/26/2016 to 09/25/2017.  NGS Results: not done  CURRENT THERAPY: surveillance  BRIEF ONCOLOGIC HISTORY:  Oncology History  Non-small cell lung cancer, right (John [name])  04/17/2016 Imaging   CT chest- 1. Examination is positive for bilateral upper lobe pulmonary lesions suspicious for malignancy. Further investigation with PET-CT and tissue sampling is advised. 2. Borderline enlarged paratracheal lymph nodes and right hilar nodes. Cannot rule out metastatic adenopathy. Attention to these areas on PET-CT is advised. 3. Emphysema 4. Aortic atherosclerosis and multi vessel coronary artery calcification.   04/18/2016 Initial Diagnosis   Adenocarcinoma of lung, right ([place])   04/24/2016 PET scan   1. Adjacent hypermetabolic nodules in the RIGHT upper lobe consistent bronchogenic carcinoma. 2. Suspicion of ipsilateral hilar and paratracheal nodal metastasis. 3. Nodule in the LEFT upper lobe with suspicious morphology [name] a relatively low metabolic activity for size. Favor synchronous primary bronchogenic carcinoma. 4. No metabolic activity associated with a small LEFT lower lobe pulmonary nodule. Recommend attention on follow-up.   05/06/2016 Imaging   MRI brain- Negative for metastatic disease.   05/28/2016 Procedure   1. Electromagnetic navigational bronchoscopy with brushings, needle     aspirations and biopsies of bilateral upper lobe nodules. 2. Endobronchial ultrasound with mediastinal and hilar lymph node     aspirations. By Dr. Roxan [name]   06/03/2016 Pathology Results   Diagnosis 1. Lung,  biopsy, Right upper lobe - BLOOD AND BENIGN BRONCHIAL AND LUNG TISSUE. 2. Lung, biopsy, Left upper lobe - BLOOD AND BENIGN BRONCHIAL AND LUNG TISSUE.   06/03/2016 Pathology Results   Diagnosis TRANSBRONCHIAL NEEDLE ASPIRATION, NAVIGATION, RUL (SPECIMEN 5 OF 7, COLLECTED 05/28/16): MALIGNANT CELLS CONSISTENT WITH NON-SMALL CELL CARCINOMA Comment There are limited tumor cells in the cell block (insufficient for molecular testing). John [name] is positive in a few of the atypical cells. They appear negative for synaptophysin and cytokeratin 5/6. Overall there is limited tumor, but a lung adenocarcinoma is slightly favored. Dr. Saralyn [name] [name] reviewed the case. The case was called to Dr. Roxan [name] on 06/03/2016.   06/13/2016 Procedure   Port placed by Dr. Arnoldo [name]   06/25/2016 - 08/15/2016 Chemotherapy   The patient had palonosetron (ALOXI) injection 0.25 mg, 0.25 mg, Intravenous,  Once, 6 of 6 cycles  CARBOplatin (PARAPLATIN) 220 mg in sodium chloride 0.9 % 250 mL chemo infusion, 220 mg (100 % of original dose 215.8 mg), Intravenous,  Once, 6 of 6 cycles Dose modification:   (original dose 215.8 mg, Cycle 1),   (original dose 215.8 mg, Cycle 5),   (original dose 215.8 mg, Cycle 6),   (original dose 215.8 mg, Cycle 2),   (original dose 215.8 mg, Cycle 3),   (original dose 215.8 mg, Cycle 4)  PACLitaxel (TAXOL) 90 mg in dextrose 5 % 250 mL chemo infusion ( for chemotherapy treatment.     06/25/2016 - 08/20/2016 Radiation Therapy   Radiation in [place], [place] by Dr. Quitman [name].  R lung to 6600 cGy   07/09/2016 - 07/10/2016 Hospital Admission   Admit date: 07/09/2016  Admission diagnosis: Fever and chills Additional comments: Negative work-up, discharged with course of Levaquin   07/09/2016 Treatment Plan Change  Treatment deferred x 1 week due to hospitalization    08/05/2016 - 08/06/2016 Hospital Admission   Admit date: 08/05/2016 Admission diagnosis: Joint pain Additional comments: Treated with steroids with  symptomatic improvement   08/25/2016 Imaging   MRI brain- Negative for intracranial metastasis on this motion degraded examination.  Stable mild-to-moderate chronic small vessel ischemic disease.   09/18/2016 PET scan   1. Partial metabolic response. Right upper lobe 2.6 cm hypermetabolic pulmonary nodule, decreased in size and metabolism. Mildly hypermetabolic right hilar nodal metastasis, decreased in metabolism. Right paratracheal lymph node is no longer hypermetabolic. No new or progressive metastatic disease. 2. Apical left upper lobe 0.9 cm solid pulmonary nodule, slightly decreased in size, with no significant metabolism. The change in size could indicate a neoplastic etiology for this nodule. 3. Additional subcentimeter pulmonary nodules in the left lung are stable and below PET resolution . 4. Additional findings include aortic atherosclerosis, coronary atherosclerosis, moderate emphysema, stable small pericardial effusion/thickening and mild sigmoid diverticulosis.   01/13/2017 Imaging   CT chest with contrast IMPRESSION: 1. No change in size of the hypermetabolic nodular thickening in the RIGHT upper lobe. 2. LEFT upper lobe spiculated nodule is stable. 3. Rounded LEFT lower lobe pulmonary nodule is stable. 4. No evidence of lung cancer progression.   05/08/2017 Adverse Reaction   Development of Hypothyroidism- likely secondary to immunotherapy.  Levothyroxine therapy initiated.      CANCER STAGING: Cancer Staging Non-small cell lung cancer, right [facility]) Staging form: Lung, AJCC 8th Edition - Clinical stage from 06/05/2016: Stage IIIA (cT1b(2), cN2, cM0) - Signed by Baird Cancer, PA-C on 08/21/2016   INTERVAL HISTORY:  John [name], a 75 y.o. male, returns for routine follow-up of his right NSCLC. John [name] was last seen on 04/03/2020.   He reports 5 UTIs after surgery on prostate. He is taking prednisone for bronchitis. He reports limited ROM and pain in  right leg. He reports Sob and attributed it to prednisone. He reports cramping in chest.  He is scheduled for right femoral endartectomomy w/ John [name] on 08/20/2020.  REVIEW OF SYSTEMS:  Review of Systems  Constitutional: Positive for appetite change (50%) and fatigue (50%).  Cardiovascular: Positive for chest pain (cramping).  Gastrointestinal: Positive for constipation and nausea.  Musculoskeletal: Positive for arthralgias (R leg pain and limited ROM).  Neurological: Positive for numbness (feet and hands).    PAST MEDICAL/SURGICAL HISTORY:  Past Medical History:  Diagnosis Date  . Adenocarcinoma of lung, right ([place]) 04/18/2016  . Anxiety   . Arthritis   . Asthma   . BPH (benign prostatic hyperplasia)    with urinary retention 02/06/20  . CHF (congestive heart failure) ([place])   . COPD (chronic obstructive pulmonary disease) ([place])   . Depression   . Diabetes mellitus without complication (HCC)    no meds  . Diabetes mellitus, type II ([place])   . DM (diabetes mellitus) ([place]) 07/09/2016  . Dyspnea   . History of kidney stones   . Hyperlipidemia   . Hypertension   . Hypertension   . Hypothyroidism   . Macular degeneration   . Neuropathy   . Non-small cell lung cancer, right ([place]) 04/18/2016  . Peripheral vascular disease ([place])   . Prostatitis   . Pulmonary nodule, left 07/16/2016  . Sleep apnea    cpap   Past Surgical History:  Procedure Laterality Date  . ABDOMINAL AORTOGRAM W/LOWER EXTREMITY Left 02/06/2020   Procedure: ABDOMINAL AORTOGRAM W/LOWER EXTREMITY;  Surgeon: Gwenlyn Found,  Pearletha Forge, MD;  Location: [facility];  Service: Cardiovascular;  Laterality: Left;  . CATARACT EXTRACTION, BILATERAL Bilateral   . PORTACATH PLACEMENT Left 06/13/2016   Procedure: INSERTION PORT-A-CATH;  Surgeon: Aviva Signs, MD;  Location: [facility];  Service: General;  Laterality: Left;  . TRANSURETHRAL RESECTION OF PROSTATE N/A 05/31/2020   Procedure: TRANSURETHRAL RESECTION OF THE  PROSTATE (TURP);  Surgeon: Cleon Gustin, MD;  Location: [facility];  Service: Urology;  Laterality: N/A;  . VIDEO BRONCHOSCOPY WITH ENDOBRONCHIAL NAVIGATION N/A 05/28/2016   Procedure: VIDEO BRONCHOSCOPY WITH ENDOBRONCHIAL NAVIGATION;  Surgeon: [name], MD;  Location: [facility];  Service: Thoracic;  Laterality: N/A;  . VIDEO BRONCHOSCOPY WITH ENDOBRONCHIAL ULTRASOUND N/A 05/28/2016   Procedure: VIDEO BRONCHOSCOPY WITH ENDOBRONCHIAL ULTRASOUND;  Surgeon: [name], MD;  Location: [facility];  Service: Thoracic;  Laterality: N/A;    SOCIAL HISTORY:  Social History   Socioeconomic History  . Marital status: Divorced    Spouse name: Not on file  . Number of children: 5  . Years of education: 10  . Highest education level: 10th grade  Occupational History  . Occupation: Retired  Tobacco Use  . Smoking status: Current Every Day Smoker    Packs/day: 0.50    Years: 55.00    Pack years: 27.50    Types: Cigarettes    Start date: 03/11/1961  . Smokeless tobacco: Never Used  . Tobacco comment: 1/2 pack to 1 pack per day 12/14/19  Vaping Use  . Vaping Use: Never used  Substance and Sexual Activity  . Alcohol use: No  . Drug use: No  . Sexual activity: Yes    Birth control/protection: None  Other Topics Concern  . Not on file  Social History Narrative  . Not on file   Social Determinants of Health   Financial Resource Strain: Not on file  Food Insecurity: Not on file  Transportation Needs: Not on file  Physical Activity: Not on file  Stress: Not on file  Social Connections: Not on file  Intimate Partner Violence: Not on file    FAMILY HISTORY:  Family History  Problem Relation Age of Onset  . Hypertension Mother   . Diabetes Father   . Heart disease Father   . Stroke Father   . Hypertension Sister     CURRENT MEDICATIONS:  Current Outpatient Medications  Medication Sig Dispense Refill  . albuterol (VENTOLIN HFA) 108 (90 Base) MCG/ACT inhaler Inhale 2  puffs into the lungs every 4 (four) hours as needed for wheezing or shortness of breath. 8 g 6  . aspirin EC 81 MG tablet Take 81 mg by mouth daily.    . bacitracin 500 UNIT/GM ointment Apply 1 application topically 2 (two) times daily. 15 g 0  . Blood Glucose Monitoring Suppl (ONETOUCH VERIO REFLECT) w/Device KIT Use to test blood sugars daily as directed. DX: E11.9 1 kit 1  . cloNIDine (CATAPRES) 0.1 MG tablet TAKE 1 TABLET (0.1 MG TOTAL) BY MOUTH 3 (THREE) TIMES DAILY. 270 tablet 0  . doxycycline (VIBRA-TABS) 100 MG tablet Take 1 tablet (100 mg total) by mouth 2 (two) times daily. 20 tablet 0  . dutasteride (AVODART) 0.5 MG capsule TAKE 1 CAPSULE BY MOUTH EVERY DAY 90 capsule 1  . fluticasone (FLONASE) 50 MCG/ACT nasal spray Place 1 spray into both nostrils daily as needed for allergies or rhinitis. (Patient taking differently: Place 1 spray into both nostrils daily.) 18.2 mL 2  . furosemide (LASIX) 20  MG tablet Take 40 mg in AM and 20 mg in afternoon (Patient taking differently: Take 20-40 mg by mouth See admin instructions. Take 40 mg in AM and 20 mg in evening) 180 tablet 2  . gabapentin (NEURONTIN) 300 MG capsule TAKE 1 CAPSULE BY MOUTH THREE TIMES A DAY (Patient taking differently: Take 300 mg by mouth 3 (three) times daily.) 270 capsule 0  . glucose blood (ONETOUCH VERIO) test strip Use to test blood sugars daily as directed. DX: E11.9 100 each 12  . linaclotide (LINZESS) 145 MCG CAPS capsule Take 1 capsule (145 mcg total) by mouth daily. To regulate bowel movements 30 capsule 5  . losartan (COZAAR) 50 MG tablet Take 1 tablet (50 mg total) by mouth daily. 90 tablet 3  . meloxicam (MOBIC) 7.5 MG tablet Take 1 tablet (7.5 mg total) by mouth daily as needed for pain (back pain). 90 tablet 0  . metFORMIN (GLUCOPHAGE-XR) 500 MG 24 hr tablet Take 1 tablet (500 mg total) by mouth daily with breakfast. 90 tablet 1  . Meth-Hyo-M Bl-Na Phos-Ph Sal (URO-MP) 118 MG CAPS TAKE 1 CAPSULE (118 MG TOTAL) BY  MOUTH 2 (TWO) TIMES DAILY AS NEEDED. (Patient taking differently: Take 118 mg by mouth daily as needed (Uninary burning).) 30 capsule 3  . metoprolol succinate (TOPROL-XL) 25 MG 24 hr tablet Take 1.5 tablets (37.5 mg total) by mouth daily. Take with or immediately following a meal. (Patient taking differently: Take 37.5 mg by mouth daily as needed (pulse rate). Take with or immediately following a meal.) 135 tablet 3  . ondansetron (ZOFRAN-ODT) 4 MG disintegrating tablet TAKE 1 TABLET BY MOUTH EVERY 8 HOURS AS NEEDED FOR NAUSEA AND VOMITING (Patient taking differently: Take 4 mg by mouth every 8 (eight) hours as needed for vomiting or nausea.) 20 tablet 0  . OneTouch Delica Lancets 44L MISC Use to test blood sugars daily as directed. DX: E11.9 100 each 1  . polyethylene glycol (MIRALAX / GLYCOLAX) 17 g packet Take 17 g by mouth daily as needed for moderate constipation or severe constipation. 14 each 0  . pravastatin (PRAVACHOL) 40 MG tablet TAKE 1 TABLET (40 MG TOTAL) BY MOUTH ON MONDAYS , WEDNESDAYS, AND FRIDAYS AS DIRECTED (Patient taking differently: Take 40 mg by mouth every Monday, Wednesday, and Friday.) 48 tablet 3  . Semaglutide (RYBELSUS) 7 MG TABS Take 7 mg by mouth daily. 90 tablet 1  . temazepam (RESTORIL) 7.5 MG capsule Take 1 capsule (7.5 mg total) by mouth at bedtime as needed for sleep. 30 capsule 1  . levothyroxine (SYNTHROID) 50 MCG tablet Take 1 tablet (50 mcg total) by mouth daily. 30 tablet 2   No current facility-administered medications for this visit.    ALLERGIES:  Allergies  Allergen Reactions  . Jardiance [Empagliflozin] Other (See Comments)    FEELS SLUGGISH, TIRED  . Lopressor [Metoprolol] Other (See Comments)    Fatigue  . Sulfa Antibiotics Swelling    Mouth swelling    PHYSICAL EXAM:  Performance status (ECOG): 1 - Symptomatic but completely ambulatory  Vitals:   08/15/20 1008  BP: (!) 125/46  Pulse: 87  Resp: 18  Temp: (!) 96.8 F (36 C)  SpO2: 98%    Wt Readings from Last 3 Encounters:  08/15/20 163 lb 11.2 oz (74.3 kg)  08/14/20 161 lb 3.2 oz (73.1 kg)  08/14/20 162 lb 11.2 oz (73.8 kg)   Physical Exam Vitals reviewed.  Constitutional:      Appearance: Normal appearance.  Cardiovascular:     Rate and Rhythm: Normal rate and regular rhythm.     Pulses: Normal pulses.     Heart sounds: Normal heart sounds.  Pulmonary:     Effort: Pulmonary effort is normal.     Breath sounds: Normal breath sounds.  Musculoskeletal:     Right lower leg: Edema (1+) present.     Left lower leg: No edema.  Neurological:     General: No focal deficit present.     Mental Status: He is alert and oriented to person, place, and time.  Psychiatric:        Mood and Affect: Mood normal.        Behavior: Behavior normal.      LABORATORY DATA:  I have reviewed the labs as listed.  CBC Latest Ref Rng & Units 08/14/2020 08/14/2020 08/13/2020  WBC 3.4 - 10.8 x10E3/uL 15.2(H) 20.7(H) 19.6(H)  Hemoglobin 13.0 - 17.7 g/dL 11.6(L) 12.1(L) 12.4(L)  Hematocrit 37.5 - 51.0 % 37.3(L) 41.3 43.7  Platelets 150 - 450 x10E3/uL 424 464(H) 444(H)   CMP Latest Ref Rng & Units 08/14/2020 08/14/2020 08/13/2020  Glucose 65 - 99 mg/dL 194(H) 232(H) 203(H)  BUN 8 - 27 mg/dL 21 25(H) 26(H)  Creatinine 0.76 - 1.27 mg/dL 1.12 1.15 0.92  Sodium 134 - 144 mmol/L 142 136 136  Potassium 3.5 - 5.2 mmol/L 4.2 3.2(L) 3.9  Chloride 96 - 106 mmol/L 96 93(L) 92(L)  CO2 20 - 29 mmol/L 31(H) 35(H) 33(H)  Calcium 8.6 - 10.2 mg/dL 9.4 8.9 9.1  Total Protein 6.0 - 8.5 g/dL 6.2 6.2(L) 6.9  Total Bilirubin 0.0 - 1.2 mg/dL 0.3 0.4 0.9  Alkaline Phos 44 - 121 IU/L 117 104 95  AST 0 - 40 IU/L 18 22 19   ALT 0 - 44 IU/L 29 30 34    DIAGNOSTIC IMAGING:  I have independently reviewed the scans and discussed with the patient. NM PET Image Restag (PS) Skull Base To Thigh  Result Date: 08/14/2020 CLINICAL DATA:  Subsequent treatment strategy for non-small-cell lung cancer diagnosed in 2015.  Chemotherapy and radiation therapy. EXAM: NUCLEAR MEDICINE PET SKULL BASE TO THIGH TECHNIQUE: 10.1 mCi F-18 FDG was injected intravenously. Full-ring PET imaging was performed from the skull base to thigh after the radiotracer. CT data was obtained and used for attenuation correction and anatomic localization. Fasting blood glucose: 173 mg/dl COMPARISON:  11/21/2019 FINDINGS: Mediastinal blood pool activity: SUV max 2.5 Liver activity: SUV max NA NECK: No areas of abnormal hypermetabolism. Incidental CT findings: Bilateral carotid atherosclerosis. No cervical adenopathy. CHEST: Left apical pulmonary nodule measures 8 mm and a S.U.V. max of 2.3 on 80/3 versus 7 mm and a S.U.V. max of 1.4 on the prior exam. Lateral right upper lobe pulmonary nodule measures 12 mm and a S.U.V. max of 7.9 on 84/3 versus 9 mm and a S.U.V. max of 2.4 on the prior exam. Redemonstration of right paramediastinal and less so perihilar presumably radiation induced architectural distortion with low-level hypermetabolism. Example at a S.U.V. max of 3.1 on 95/3. No thoracic nodal hypermetabolism. Incidental CT findings: Left Port-A-Cath tip at low SVC. Aortic and coronary artery calcification. New trace right pleural fluid. Centrilobular emphysema. Smaller left apical pulmonary nodules include a 4 mm on 73/3 are felt to be similar. 4 mm left lower lobe pulmonary nodule on 119/3 is unchanged. ABDOMEN/PELVIS: No abdominopelvic parenchymal or nodal hypermetabolism. Incidental CT findings: Mild left adrenal thickening is unchanged. Low-density right renal lesion is presumably a cyst. Caudate  lobe enlargement with subtle irregular hepatic capsule. Extensive colonic diverticulosis. Interval TURP defect. SKELETON: No abnormal marrow activity. Incidental CT findings: Mild bilateral hip osteoarthritis. IMPRESSION: 1. Further progression of bilateral upper lobe hypermetabolic pulmonary nodules, metastatic disease versus metachronous primaries. 2. No  evidence of thoracic nodal or distant metastasis. 3. Hepatic morphology for which mild cirrhosis is suspected. Correlate with risk factors. 4. New trace right pleural fluid. 5. Incidental findings, including: Aortic atherosclerosis (ICD10-I70.0), coronary artery atherosclerosis and emphysema (ICD10-J43.9). Electronically Signed   By: Abigail Miyamoto M.D.   On: 08/14/2020 13:40     ASSESSMENT:  1. Stage IIIa (T1b N2 cM0) NSCLC, favoring adenocarcinoma: -Chemo XRT with weekly carboplatin and paclitaxel completed on 08/20/2016. -Consolidation durvalumab from 09/26/2016 through 09/25/2017. -Reviewed CT chest on 11/07/2019 which showed increasing size of the upper lobe pulmonary nodules with no new nodule in the left upper lobe suspicious for meta stasis. Also increasing confluent soft tissue in the right suprahilar region, thought to be postradiation changes. -PET scan on 11/21/2019 showed suspected radiation changes in the right paramediastinal/perihilar region although mild hypermetabolism is present in the right retroareolar region.SUV 2.5. While progressive soft tissue is present in this region, this hypermetabolism remains nonfocal and favors radiation changes. 7 mm central left upper lobe nodule max SUV 1.4. 9 mm aggregate nodule in the lateral right upper lobe max SUV 2.4. These are minimally progressive from more remote prior scans. 6 mm left lower lobe nodule unchanged. No hypermetabolic thoracic adenopathy. -CT chest on 03/23/2020 showed many of the lung nodules are stable.  Couple of nodules have grown by few millimeters.  No new areas seen.   PLAN:  1. Stage IIIa (T1b N2 cM0) NSCLC, favoring adenocarcinoma: -We have reviewed follow-up PET scan from 08/13/2020. - Left apical lung nodule measures 8 mm, SUV 2.3, increased from 7 mm previously. - Lateral right upper lobe pulmonary nodule measures 12 mm, SUV 7.9, previously 9 mm, SUV 2.4. - No evidence of thoracic nodal or distant metastasis. -  These 2 nodules are slowly growing from the last 2 scans. - He had few hospitalizations in the last few months due to UTIs following prostate procedure. - Based on his performance status, he would benefit from radiation oncology evaluation for SBRT of both lung lesions. - We will make a referral to [facility] radiation oncology. - RTC 4 months with repeat CT scan of the chest with contrast.  2. Hypothyroidism: -TSH on 08/13/2020 1.5.  Continue Synthroid.  3. Difficulty staying asleep: -Continue temazepam as needed.   Orders placed this encounter:  No orders of the defined types were placed in this encounter.    Derek Jack, MD [place] [phone]   I, Thana Ates, am acting as a scribe for Dr. Sanda Linger.  I, Derek Jack MD, have reviewed the above documentation for accuracy and completeness, and I agree with the above.

## 2020-08-15 NOTE — Patient Instructions (Addendum)
[place] at [facility] Discharge Instructions  You were seen today by Dr. Delton Coombes. He went over your recent results. You will be referred to [facility] for radiation therapy. You will be scheduled for a CT of the chest prior to your next visit. Dr. Delton Coombes will see you back in 4 months for labs and follow up.   Thank you for choosing [place] at [facility] to provide your oncology and hematology care.  To afford each patient quality time with our provider, please arrive at least 15 minutes before your scheduled appointment time.   If you have a lab appointment with the [facility] please come in thru the Main Entrance and check in at the main information desk  You need to re-schedule your appointment should you arrive 10 or more minutes late.  We strive to give you quality time with our providers, and arriving late affects you and other patients whose appointments are after yours.  Also, if you no show three or more times for appointments you may be dismissed from the clinic at the providers discretion.     Again, thank you for choosing [facility].  Our hope is that these requests will decrease the amount of time that you wait before being seen by our physicians.       _____________________________________________________________  Should you have questions after your visit to [facility], please contact our office at [phone] between the hours of 8:00 a.m. and 4:30 p.m.  Voicemails left after 4:00 p.m. will not be returned until the following business day.  For prescription refill requests, have your pharmacy contact our office and allow 72 hours.    Cancer Center Support Programs:   > Cancer Support Group  2nd Tuesday of the month 1pm-2pm, Journey Room

## 2020-08-15 NOTE — Progress Notes (Addendum)
Anesthesia Chart Review:  Case: [id number] Date/Time: 08/20/20 0715   Procedures:      ENDARTERECTOMY FEMORAL RIGHT (Right )     RETROGRADE INSERTION OF ILIAC STENT RIGHT (Right )   Anesthesia type: General   Pre-op diagnosis: PAD   Location: [facility] OR ROOM 16 / [place] [facility]   Surgeons: Marty Heck, MD      DISCUSSION: Patient is a 75 year old male scheduled for the above procedure. He was referred for this procedure by cardiologist Dr. Gwenlyn Found following results of 02/06/20 peripheral angiography, and it was inititially scheduled for 04/09/20 but case was postponed. He instead underwent TURP 05/31/20 after developing recurrent UTI with urosepsis from urinary retention requiring foley catheter.   History includes smoking, COPD, DM2, HLD, lung cancer (stage IIIa right non-small cell lung cancer; s/p Port-a-cath 06/13/16, chemoradiation through May 2018, durvalumab through 09/25/17), dyspnea, HTN, PAD, coronary calcifications (on CT, 2019 NST: scar versus soft tissue with no ischemia), CHF, OSA (uses CPAP), asthma, neuropathy, prostatitis, macular degeneration, hypothyroidism, BPH (with urinary retention requiring foley, urosepsis 02/2020, Pseudomonas UTI 05/24/20; s/p TURP 05/31/20).   Last cardiology visit 05/24/20 with Bernerd Pho, PA-C for routine follow-up, and prior to him undergoing TURP. Stable chronic DOE, no chest pain. Plan was for vascular surgery after he recovered from TURP. She felt he was acceptable risk for that procedure, with no further cardiac testing recommended preoperatively.   Last visit with oncologist Dr. Delton Coombes 08/14/20. PET scan 08/13/20 showing two lung nodules that have slowly grown in size from the last two scans. He is referring patient for radiation oncology evaluation for SBRT on both lesions. 4 month follow-up with repeat chest CT planned. He is aware of surgery plans.   He had two sets of labs drawn on 08/14/20, first during his PAT visit and secondly through primary care  following a visit with Dr.Thacker for abdominal pain/bloating. He had developed severe abdominal pain 2 days prior but then had a large +BM with some noted blood. He then had loose stools then small pellet-like stools, but improvement in pain. Still with some bloating and intermittent nausea.. Abd xray was unremarkable. HGB stable ~ 12. LFT and lipase normal. Amylase low at 24. Prescribed Linzess. WBC 15.2K, but down from 19.6->20.7 since 08/13/20. UA was negative for leukocytes and nitrites. He was prescribed 10 day course of doxycycline and a Sterapred pack on 08/08/20 for acute bronchitis, so steroids could be a contributing factor in his leukocytosis. 08/14/20 CXR showed no evidence of acute airspace disease or effusion (know pulmonary nodules). No chest pain, SOB, fever, cough per PAT RN interview. This information was communicated to Dr. Carlis Abbott and [facility] nurses Herma Ard and Cristie Hem. I'll plan to update note if he responds with any additional recommendations. (UPDATE 08/17/20 12:30 PM: Dr. Carlis Abbott has reviewed labs and called and spoken with Dr. Sallee Lange. Patient's abdominal pain is better. He is off steroids and no longer a a foley catheter since his prostate surgery. No current voiding issues. Patient is not aware of any active symptoms of infection and feels overall well. He is anticipating patient can proceed as scheduled if no acute changes, but will also repat CBC and BMET on the morning of surgery.)  Preoperative COVID-19 test is scheduled for 08/17/20. Anesthesia team to evaluate on the day of surgery.    VS: BP (!) 120/55   Pulse 88   Temp 37 C (Oral)   Resp 18   Ht _0  (1.676 m)   Wt 73.8  kg   SpO2 97%   BMI 26.26 kg/m     PROVIDERS: - Hawks, [name], FNP is PCP - Quay Burow, MD is [facility] cardiologist. Previously saw [name], MD as primary cardiologist, who is no longer with the practice. Last visit with Bernerd Pho, PA-C on 05/24/20.  Derek Jack, MD is HEM-ONC Star Age, MD is neurologist ([facility]) - Nicolette Bang, MD is urologist   LABS: Preoperative labs noted. He has had additional labs since then showing a decreased in WBC to 15.2K. See DISCUSSION. A1c 6.4% 08/03/20. (all labs ordered are listed, but only abnormal results are displayed)  Labs Reviewed  GLUCOSE, CAPILLARY - Abnormal; Notable for the following components:      Result Value   Glucose-Capillary 234 (*)    All other components within normal limits  CBC - Abnormal; Notable for the following components:   WBC 20.7 (*)    Hemoglobin 12.1 (*)    MCH 24.8 (*)    MCHC 29.3 (*)    RDW 17.2 (*)    Platelets 464 (*)    All other components within normal limits  COMPREHENSIVE METABOLIC PANEL - Abnormal; Notable for the following components:   Potassium 3.2 (*)    Chloride 93 (*)    CO2 35 (*)    Glucose, Bld 232 (*)    BUN 25 (*)    Total Protein 6.2 (*)    Albumin 3.4 (*)    All other components within normal limits  URINALYSIS, ROUTINE W REFLEX MICROSCOPIC - Abnormal; Notable for the following components:   Color, Urine STRAW (*)    All other components within normal limits  SURGICAL PCR SCREEN  PROTIME-INR  APTT  TYPE AND SCREEN     IMAGES: CXR 08/14/20:  IMPRESSION: 1. Bilateral upper lobe pulmonary nodules consistent with malignancy, fully described on PET CT performed 08/13/2020.  PET Scan 08/13/20: IMPRESSION: 1. Further progression of bilateral upper lobe hypermetabolic pulmonary nodules, metastatic disease versus metachronous primaries. 2. No evidence of thoracic nodal or distant metastasis. 3. Hepatic morphology for which mild cirrhosis is suspected. Correlate with risk factors. 4. New trace right pleural fluid. 5. Incidental findings, including: Aortic atherosclerosis (ICD10-I70.0), coronary artery atherosclerosis and emphysema (ICD10-J43.9).  CT Chest 03/23/20: IMPRESSION: 1. Small pulmonary nodules in the lungs bilaterally  demonstrating persistent mild progression compared to prior examinations, concerning for metastatic lesions. 2. Stable postradiation changes in the right lung, similar to prior examinations. 3. Diffuse bronchial wall thickening with mild centrilobular and paraseptal emphysema; imaging findings suggestive of underlying COPD. 4. Aortic atherosclerosis, in addition to left main and 2 vessel coronary artery disease. Assessment for potential risk factor modification, dietary therapy or pharmacologic therapy may be warranted, if clinically indicated. 5. There are calcifications of the aortic valve and mitral annulus. Echocardiographic correlation for evaluation of potential valvular dysfunction may be warranted if clinically indicated.    EKG: 06/02/20: Sinus or ectopic atrial tachycardia Incomplete right bundle branch block Inferior infarct, old Confirmed by Veryl Speak [id number]) on 06/02/2020 6:54:22 AM   CV: Nuclear stress test 03/12/18:  No diagnostic ST segment changes to indicate ischemia.  Medium sized, severe intensity, inferior defect extending from apex to base with inferior septal defect at the base. These regions are fixed and suggestive of scar versus soft tissue attenuation.  This is an intermediate risk study.  Nuclear stress EF: 48%. Visually, LVEF appears normal, consider echocardiogram if not done recently. (Per Dr. Bronson Ing, "No significant blockages. Symptoms likely  due to COPD based on this study."; Comparison NST on 05/05/16 showed small, mild intensity, partially reversible inferior defect suggesting small ischemic territory, low risk study, EF 61%)   Echo 01/15/18: Study Conclusions  - Left ventricle: The cavity size was normal. Wall thickness was  normal. Systolic function was normal. The estimated ejection  fraction was in the range of 60% to 65%. Although no diagnostic  regional wall motion abnormality was identified, this possibility   cannot be completely excluded on the basis of this study.  Indeterminate diastolic function.  - Aortic valve: Mildly calcified annulus. Probably trileaflet;  moderately calcified leaflets.  - Mitral valve: Moderately calcified annulus. There was trivial  regurgitation.  - Right ventricle: Systolic function was mildly reduced.  - Right atrium: Central venous pressure (est): 8 mm Hg.  - Tricuspid valve: There was trivial regurgitation.  - Pulmonary arteries: Systolic pressure could not be accurately  estimated.  - Pericardium, extracardiac: A prominent pericardial fat pad was  present. Cannot exclude small anteroapical pericardial effusion  with signs of chronicity.    Past Medical History:  Diagnosis Date  . Adenocarcinoma of lung, right ([place]) 04/18/2016  . Anxiety   . Arthritis   . Asthma   . BPH (benign prostatic hyperplasia)    with urinary retention 02/06/20  . CHF (congestive heart failure) ([place])   . COPD (chronic obstructive pulmonary disease) ([place])   . Depression   . Diabetes mellitus without complication (HCC)    no meds  . Diabetes mellitus, type II ([place])   . DM (diabetes mellitus) ([place]) 07/09/2016  . Dyspnea   . History of kidney stones   . Hyperlipidemia   . Hypertension   . Hypertension   . Hypothyroidism   . Macular degeneration   . Neuropathy   . Non-small cell lung cancer, right ([place]) 04/18/2016  . Peripheral vascular disease ([place])   . Prostatitis   . Pulmonary nodule, left 07/16/2016  . Sleep apnea    cpap    Past Surgical History:  Procedure Laterality Date  . ABDOMINAL AORTOGRAM W/LOWER EXTREMITY Left 02/06/2020   Procedure: ABDOMINAL AORTOGRAM W/LOWER EXTREMITY;  Surgeon: Lorretta Harp, MD;  Location: [facility];  Service: Cardiovascular;  Laterality: Left;  . CATARACT EXTRACTION, BILATERAL Bilateral   . PORTACATH PLACEMENT Left 06/13/2016   Procedure: INSERTION PORT-A-CATH;  Surgeon: Aviva Signs, MD;  Location: [facility];   Service: General;  Laterality: Left;  . TRANSURETHRAL RESECTION OF PROSTATE N/A 05/31/2020   Procedure: TRANSURETHRAL RESECTION OF THE PROSTATE (TURP);  Surgeon: Cleon Gustin, MD;  Location: [facility];  Service: Urology;  Laterality: N/A;  . VIDEO BRONCHOSCOPY WITH ENDOBRONCHIAL NAVIGATION N/A 05/28/2016   Procedure: VIDEO BRONCHOSCOPY WITH ENDOBRONCHIAL NAVIGATION;  Surgeon: [name], MD;  Location: [place];  Service: Thoracic;  Laterality: N/A;  . VIDEO BRONCHOSCOPY WITH ENDOBRONCHIAL ULTRASOUND N/A 05/28/2016   Procedure: VIDEO BRONCHOSCOPY WITH ENDOBRONCHIAL ULTRASOUND;  Surgeon: [name], MD;  Location: [place];  Service: Thoracic;  Laterality: N/A;    MEDICATIONS: . albuterol (VENTOLIN HFA) 108 (90 Base) MCG/ACT inhaler  . aspirin EC 81 MG tablet  . bacitracin 500 UNIT/GM ointment  . Blood Glucose Monitoring Suppl (ONETOUCH VERIO REFLECT) w/Device KIT  . cloNIDine (CATAPRES) 0.1 MG tablet  . doxycycline (VIBRA-TABS) 100 MG tablet  . dutasteride (AVODART) 0.5 MG capsule  . fluticasone (FLONASE) 50 MCG/ACT nasal spray  . furosemide (LASIX) 20 MG tablet  . gabapentin (NEURONTIN) 300 MG capsule  . glucose  blood (ONETOUCH VERIO) test strip  . levothyroxine (SYNTHROID) 50 MCG tablet  . linaclotide (LINZESS) 145 MCG CAPS capsule  . losartan (COZAAR) 50 MG tablet  . meloxicam (MOBIC) 7.5 MG tablet  . metFORMIN (GLUCOPHAGE-XR) 500 MG 24 hr tablet  . Meth-Hyo-M Bl-Na Phos-Ph Sal (URO-MP) 118 MG CAPS  . metoprolol succinate (TOPROL-XL) 25 MG 24 hr tablet  . ondansetron (ZOFRAN-ODT) 4 MG disintegrating tablet  . OneTouch Delica Lancets 08L MISC  . polyethylene glycol (MIRALAX / GLYCOLAX) 17 g packet  . pravastatin (PRAVACHOL) 40 MG tablet  . Semaglutide (RYBELSUS) 7 MG TABS  . temazepam (RESTORIL) 7.5 MG capsule   No current facility-administered medications for this encounter.    Myra Gianotti, PA-C Surgical Short Stay/Anesthesiology [facility] Phone [phone] [facility] Phone [phone] 08/16/2020 10:16 AM

## 2020-08-16 DIAGNOSIS — E1142 Type 2 diabetes mellitus with diabetic polyneuropathy: Secondary | ICD-10-CM | POA: Diagnosis not present

## 2020-08-16 DIAGNOSIS — L97521 Non-pressure chronic ulcer of other part of left foot limited to breakdown of skin: Secondary | ICD-10-CM | POA: Diagnosis not present

## 2020-08-16 NOTE — Progress Notes (Signed)
Location of tumor and Histology per Pathology Report: right lung   Past/Anticipated interventions by surgeon, if any: none  Past/Anticipated interventions by medical oncology, if any:     Pain issues, if any:  no   SAFETY ISSUES:  Prior radiation? yes, bilateral lungs  Pacemaker/ICD? no  Possible current pregnancy?no  Is the patient on methotrexate? no  Current Complaints / other details:  none     Vitals:   08/23/20 1233  BP: (!) 105/46  Pulse: 88  Resp: 18  Temp: (!) 96.8 F (36 C)  TempSrc: Temporal  SpO2: 100%  Weight: 165 lb 4 oz (75 kg)  Height: 5\' 6"  (1.676 m)

## 2020-08-16 NOTE — Anesthesia Preprocedure Evaluation (Addendum)
Anesthesia Evaluation  Patient identified by MRN, date of birth, ID band Patient awake    Reviewed: Allergy & Precautions, NPO status , Patient's Chart, lab work & pertinent test results  Airway Mallampati: II  TM Distance: >3 FB Neck ROM: Full    Dental  (+) Teeth Intact   Pulmonary asthma , sleep apnea , COPD, Current Smoker and Patient abstained from smoking.,    Pulmonary exam normal        Cardiovascular hypertension, Pt. on medications and Pt. on home beta blockers + Peripheral Vascular Disease and +CHF   Rhythm:Regular Rate:Normal     Neuro/Psych Anxiety Depression negative neurological ROS     GI/Hepatic negative GI ROS, Neg liver ROS,   Endo/Other  diabetes, Type 2, Oral Hypoglycemic AgentsHypothyroidism   Renal/GU negative Renal ROS  negative genitourinary   Musculoskeletal  (+) Arthritis ,   Abdominal (+)  Abdomen: soft. Bowel sounds: normal.  Peds  Hematology negative hematology ROS (+)   Anesthesia Other Findings   Reproductive/Obstetrics                           Anesthesia Physical Anesthesia Plan  ASA: III  Anesthesia Plan: General   Post-op Pain Management:    Induction: Intravenous  PONV Risk Score and Plan: 1 and Ondansetron, Dexamethasone and Treatment may vary due to age or medical condition  Airway Management Planned: Mask and Oral ETT  Additional Equipment: Arterial line  Intra-op Plan:   Post-operative Plan: Extubation in OR  Informed Consent: I have reviewed the patients History and Physical, chart, labs and discussed the procedure including the risks, benefits and alternatives for the proposed anesthesia with the patient or authorized representative who has indicated his/her understanding and acceptance.     Dental advisory given  Plan Discussed with: CRNA  Anesthesia Plan Comments: (PAT note written 08/16/2020 by Myra Gianotti, PA-C. Lab  Results      Component                Value               Date                      WBC                      14.0 (H)            08/20/2020                HGB                      11.1 (L)            08/20/2020                HCT                      38.7 (L)            08/20/2020                MCV                      86.0                08/20/2020                PLT  295                 08/20/2020           Lab Results      Component                Value               Date                      NA                       137                 08/20/2020                K                        3.3 (L)             08/20/2020                CO2                      25                  08/20/2020                GLUCOSE                  198 (H)             08/20/2020                BUN                      19                  08/20/2020                CREATININE               0.95                08/20/2020                CALCIUM                  8.9                 08/20/2020                GFRNONAA                 >60                 08/20/2020                GFRAA                    102                 05/24/2020           )      Anesthesia Quick Evaluation

## 2020-08-17 ENCOUNTER — Other Ambulatory Visit (HOSPITAL_COMMUNITY)
Admission: RE | Admit: 2020-08-17 | Discharge: 2020-08-17 | Disposition: A | Discharge status: Home / Self Care | Payer: Medicare Other | Source: Ambulatory Visit | Attending: Vascular Surgery | Admitting: Vascular Surgery

## 2020-08-17 ENCOUNTER — Other Ambulatory Visit: Payer: Self-pay

## 2020-08-17 DIAGNOSIS — Z20822 Contact with and (suspected) exposure to covid-19: Secondary | ICD-10-CM | POA: Insufficient documentation

## 2020-08-17 DIAGNOSIS — Z01812 Encounter for preprocedural laboratory examination: Secondary | ICD-10-CM | POA: Insufficient documentation

## 2020-08-18 LAB — SARS CORONAVIRUS 2 (TAT 6-24 HRS): SARS Coronavirus 2: NEGATIVE

## 2020-08-20 ENCOUNTER — Inpatient Hospital Stay (HOSPITAL_COMMUNITY): Payer: Medicare Other | Admitting: Vascular Surgery

## 2020-08-20 ENCOUNTER — Inpatient Hospital Stay (HOSPITAL_COMMUNITY): Payer: Medicare Other | Admitting: Anesthesiology

## 2020-08-20 ENCOUNTER — Inpatient Hospital Stay (HOSPITAL_COMMUNITY): Payer: Medicare Other

## 2020-08-20 ENCOUNTER — Inpatient Hospital Stay (HOSPITAL_COMMUNITY)
Admission: RE | Admit: 2020-08-20 | Discharge: 2020-08-22 | DRG: 271 | Disposition: A | Discharge status: Home Health | Payer: Medicare Other | Attending: Vascular Surgery | Admitting: Vascular Surgery

## 2020-08-20 ENCOUNTER — Encounter (HOSPITAL_COMMUNITY): Payer: Self-pay | Admitting: Vascular Surgery

## 2020-08-20 ENCOUNTER — Other Ambulatory Visit: Payer: Self-pay

## 2020-08-20 ENCOUNTER — Encounter (HOSPITAL_COMMUNITY)
Admission: RE | Disposition: A | Discharge status: Home / Self Care | Payer: Self-pay | Source: Ambulatory Visit | Attending: Vascular Surgery

## 2020-08-20 DIAGNOSIS — I509 Heart failure, unspecified: Secondary | ICD-10-CM

## 2020-08-20 DIAGNOSIS — E876 Hypokalemia: Secondary | ICD-10-CM | POA: Diagnosis not present

## 2020-08-20 DIAGNOSIS — M1990 Unspecified osteoarthritis, unspecified site: Secondary | ICD-10-CM | POA: Diagnosis present

## 2020-08-20 DIAGNOSIS — I70221 Atherosclerosis of native arteries of extremities with rest pain, right leg: Secondary | ICD-10-CM | POA: Diagnosis not present

## 2020-08-20 DIAGNOSIS — Z833 Family history of diabetes mellitus: Secondary | ICD-10-CM | POA: Diagnosis not present

## 2020-08-20 DIAGNOSIS — I5032 Chronic diastolic (congestive) heart failure: Secondary | ICD-10-CM | POA: Diagnosis present

## 2020-08-20 DIAGNOSIS — G4733 Obstructive sleep apnea (adult) (pediatric): Secondary | ICD-10-CM | POA: Diagnosis not present

## 2020-08-20 DIAGNOSIS — J449 Chronic obstructive pulmonary disease, unspecified: Secondary | ICD-10-CM | POA: Diagnosis not present

## 2020-08-20 DIAGNOSIS — Z923 Personal history of irradiation: Secondary | ICD-10-CM

## 2020-08-20 DIAGNOSIS — I2510 Atherosclerotic heart disease of native coronary artery without angina pectoris: Secondary | ICD-10-CM | POA: Diagnosis present

## 2020-08-20 DIAGNOSIS — I4892 Unspecified atrial flutter: Secondary | ICD-10-CM | POA: Diagnosis present

## 2020-08-20 DIAGNOSIS — I708 Atherosclerosis of other arteries: Secondary | ICD-10-CM | POA: Diagnosis present

## 2020-08-20 DIAGNOSIS — Z20822 Contact with and (suspected) exposure to covid-19: Secondary | ICD-10-CM | POA: Diagnosis present

## 2020-08-20 DIAGNOSIS — E871 Hypo-osmolality and hyponatremia: Secondary | ICD-10-CM | POA: Diagnosis not present

## 2020-08-20 DIAGNOSIS — Z9221 Personal history of antineoplastic chemotherapy: Secondary | ICD-10-CM

## 2020-08-20 DIAGNOSIS — J441 Chronic obstructive pulmonary disease with (acute) exacerbation: Secondary | ICD-10-CM

## 2020-08-20 DIAGNOSIS — Z888 Allergy status to other drugs, medicaments and biological substances status: Secondary | ICD-10-CM | POA: Diagnosis not present

## 2020-08-20 DIAGNOSIS — Z85118 Personal history of other malignant neoplasm of bronchus and lung: Secondary | ICD-10-CM | POA: Diagnosis not present

## 2020-08-20 DIAGNOSIS — Z8249 Family history of ischemic heart disease and other diseases of the circulatory system: Secondary | ICD-10-CM | POA: Diagnosis not present

## 2020-08-20 DIAGNOSIS — F419 Anxiety disorder, unspecified: Secondary | ICD-10-CM | POA: Diagnosis not present

## 2020-08-20 DIAGNOSIS — I483 Typical atrial flutter: Secondary | ICD-10-CM | POA: Diagnosis not present

## 2020-08-20 DIAGNOSIS — I739 Peripheral vascular disease, unspecified: Secondary | ICD-10-CM | POA: Diagnosis not present

## 2020-08-20 DIAGNOSIS — I70201 Unspecified atherosclerosis of native arteries of extremities, right leg: Secondary | ICD-10-CM | POA: Diagnosis present

## 2020-08-20 DIAGNOSIS — E1140 Type 2 diabetes mellitus with diabetic neuropathy, unspecified: Secondary | ICD-10-CM | POA: Diagnosis not present

## 2020-08-20 DIAGNOSIS — Z7982 Long term (current) use of aspirin: Secondary | ICD-10-CM

## 2020-08-20 DIAGNOSIS — I110 Hypertensive heart disease with heart failure: Secondary | ICD-10-CM | POA: Diagnosis present

## 2020-08-20 DIAGNOSIS — Z79899 Other long term (current) drug therapy: Secondary | ICD-10-CM

## 2020-08-20 DIAGNOSIS — E785 Hyperlipidemia, unspecified: Secondary | ICD-10-CM | POA: Diagnosis present

## 2020-08-20 DIAGNOSIS — I70211 Atherosclerosis of native arteries of extremities with intermittent claudication, right leg: Secondary | ICD-10-CM | POA: Diagnosis not present

## 2020-08-20 DIAGNOSIS — E1151 Type 2 diabetes mellitus with diabetic peripheral angiopathy without gangrene: Principal | ICD-10-CM | POA: Diagnosis present

## 2020-08-20 DIAGNOSIS — F17210 Nicotine dependence, cigarettes, uncomplicated: Secondary | ICD-10-CM | POA: Diagnosis present

## 2020-08-20 DIAGNOSIS — Z882 Allergy status to sulfonamides status: Secondary | ICD-10-CM | POA: Diagnosis not present

## 2020-08-20 DIAGNOSIS — Z823 Family history of stroke: Secondary | ICD-10-CM

## 2020-08-20 DIAGNOSIS — E039 Hypothyroidism, unspecified: Secondary | ICD-10-CM | POA: Diagnosis not present

## 2020-08-20 DIAGNOSIS — I4891 Unspecified atrial fibrillation: Secondary | ICD-10-CM | POA: Diagnosis not present

## 2020-08-20 DIAGNOSIS — Z7984 Long term (current) use of oral hypoglycemic drugs: Secondary | ICD-10-CM

## 2020-08-20 DIAGNOSIS — Z79890 Hormone replacement therapy (postmenopausal): Secondary | ICD-10-CM

## 2020-08-20 DIAGNOSIS — E1165 Type 2 diabetes mellitus with hyperglycemia: Secondary | ICD-10-CM

## 2020-08-20 HISTORY — PX: PATCH ANGIOPLASTY: SHX6230

## 2020-08-20 HISTORY — PX: INTRAOPERATIVE ARTERIOGRAM: SHX5157

## 2020-08-20 HISTORY — PX: ENDARTERECTOMY FEMORAL: SHX5804

## 2020-08-20 HISTORY — PX: INSERTION OF ILIAC STENT: SHX6256

## 2020-08-20 LAB — GLUCOSE, CAPILLARY
Glucose-Capillary: 199 mg/dL — ABNORMAL HIGH (ref 70–99)
Glucose-Capillary: 140 mg/dL — ABNORMAL HIGH (ref 70–99)
Glucose-Capillary: 130 mg/dL — ABNORMAL HIGH (ref 70–99)
Glucose-Capillary: 113 mg/dL — ABNORMAL HIGH (ref 70–99)
Glucose-Capillary: 169 mg/dL — ABNORMAL HIGH (ref 70–99)

## 2020-08-20 LAB — CBC
HCT: 38.7 % — ABNORMAL LOW (ref 39.0–52.0)
Hemoglobin: 11.1 g/dL — ABNORMAL LOW (ref 13.0–17.0)
MCH: 24.7 pg — ABNORMAL LOW (ref 26.0–34.0)
MCHC: 28.7 g/dL — ABNORMAL LOW (ref 30.0–36.0)
MCV: 86 fL (ref 80.0–100.0)
Platelets: 295 10*3/uL (ref 150–400)
RBC: 4.5 MIL/uL (ref 4.22–5.81)
RDW: 17.6 % — ABNORMAL HIGH (ref 11.5–15.5)
WBC: 14 10*3/uL — ABNORMAL HIGH (ref 4.0–10.5)
nRBC: 0 % (ref 0.0–0.2)

## 2020-08-20 LAB — BASIC METABOLIC PANEL
Anion gap: 9 (ref 5–15)
BUN: 19 mg/dL (ref 8–23)
CO2: 25 mmol/L (ref 22–32)
Calcium: 8.9 mg/dL (ref 8.9–10.3)
Chloride: 103 mmol/L (ref 98–111)
Creatinine, Ser: 0.95 mg/dL (ref 0.61–1.24)
GFR, Estimated: >60 mL/min (ref >60)
Glucose, Bld: 198 mg/dL — ABNORMAL HIGH (ref 70–99)
Potassium: 3.3 mmol/L — ABNORMAL LOW (ref 3.5–5.1)
Sodium: 137 mmol/L (ref 135–145)

## 2020-08-20 LAB — HEMOGLOBIN A1C
Hgb A1c MFr Bld: 7.5 % — ABNORMAL HIGH (ref 4.8–5.6)
Mean Plasma Glucose: 168.55 mg/dL

## 2020-08-20 SURGERY — ENDARTERECTOMY, FEMORAL
Anesthesia: General | Site: Groin | Laterality: Right

## 2020-08-20 MED ORDER — FENTANYL CITRATE (PF) 100 MCG/2ML IJ SOLN
INTRAMUSCULAR | Status: DC | PRN
  Administered 2020-08-20: 100 ug via INTRAVENOUS

## 2020-08-20 MED ORDER — MAGNESIUM SULFATE 2 GM/50ML IV SOLN: 2.0000 g | Freq: Every day | INTRAVENOUS | Status: DC | PRN

## 2020-08-20 MED ORDER — HYDROMORPHONE HCL 1 MG/ML IJ SOLN: 0.5000 mg | INTRAMUSCULAR | Status: DC | PRN

## 2020-08-20 MED ORDER — DUTASTERIDE 0.5 MG PO CAPS
0.5000 mg | ORAL_CAPSULE | Freq: Every day | ORAL | Status: DC
  Filled 2020-08-20 (×2): qty 1

## 2020-08-20 MED ORDER — CLOPIDOGREL BISULFATE 75 MG PO TABS
75.0000 mg | ORAL_TABLET | Freq: Every day | ORAL | Status: DC
  Administered 2020-08-21 – 2020-08-22 (×2): 75 mg via ORAL
  Filled 2020-08-20 (×2): qty 1

## 2020-08-20 MED ORDER — LEVOTHYROXINE SODIUM 50 MCG PO TABS
50.0000 ug | ORAL_TABLET | Freq: Every day | ORAL | Status: DC
  Administered 2020-08-21 – 2020-08-22 (×2): 50 ug via ORAL
  Filled 2020-08-20 (×2): qty 1

## 2020-08-20 MED ORDER — FLEET ENEMA 7-19 GM/118ML RE ENEM: 1.0000 | ENEMA | Freq: Once | RECTAL | Status: DC | PRN

## 2020-08-20 MED ORDER — SODIUM CHLORIDE 0.9 % IV SOLN: INTRAVENOUS | Status: DC

## 2020-08-20 MED ORDER — SUGAMMADEX SODIUM 200 MG/2ML IV SOLN
INTRAVENOUS | Status: DC | PRN
  Administered 2020-08-20: 200 mg via INTRAVENOUS

## 2020-08-20 MED ORDER — HYDRALAZINE HCL 20 MG/ML IJ SOLN: 5.0000 mg | INTRAMUSCULAR | Status: DC | PRN

## 2020-08-20 MED ORDER — HEPARIN SODIUM (PORCINE) 5000 UNIT/ML IJ SOLN
5000.0000 [IU] | Freq: Three times a day (TID) | INTRAMUSCULAR | Status: DC
  Administered 2020-08-21: 5000 [IU] via SUBCUTANEOUS
  Filled 2020-08-20: qty 1

## 2020-08-20 MED ORDER — METOPROLOL SUCCINATE ER 25 MG PO TB24: 37.5000 mg | ORAL_TABLET | Freq: Every day | ORAL | Status: DC | PRN

## 2020-08-20 MED ORDER — SEMAGLUTIDE 7 MG PO TABS: 7.0000 mg | ORAL_TABLET | Freq: Every day | ORAL | Status: DC

## 2020-08-20 MED ORDER — PHENOL 1.4 % MT LIQD: 1.0000 | OROMUCOSAL | Status: DC | PRN

## 2020-08-20 MED ORDER — INSULIN ASPART 100 UNIT/ML IJ SOLN
0.0000 [IU] | Freq: Three times a day (TID) | INTRAMUSCULAR | Status: DC
  Administered 2020-08-21 (×3): 3 [IU] via SUBCUTANEOUS
  Administered 2020-08-22: 2 [IU] via SUBCUTANEOUS

## 2020-08-20 MED ORDER — LACTATED RINGERS IV SOLN: INTRAVENOUS | Status: DC

## 2020-08-20 MED ORDER — BISACODYL 5 MG PO TBEC: 5.0000 mg | DELAYED_RELEASE_TABLET | Freq: Every day | ORAL | Status: DC | PRN

## 2020-08-20 MED ORDER — TEMAZEPAM 7.5 MG PO CAPS
7.5000 mg | ORAL_CAPSULE | Freq: Every evening | ORAL | Status: DC | PRN
  Administered 2020-08-20: 7.5 mg via ORAL
  Filled 2020-08-20: qty 1

## 2020-08-20 MED ORDER — PROPOFOL 10 MG/ML IV BOLUS
INTRAVENOUS | Status: DC | PRN
  Administered 2020-08-20: 90 mg via INTRAVENOUS

## 2020-08-20 MED ORDER — CHLORHEXIDINE GLUCONATE CLOTH 2 % EX PADS: 6.0000 | MEDICATED_PAD | Freq: Once | CUTANEOUS | Status: DC

## 2020-08-20 MED ORDER — FENTANYL CITRATE (PF) 100 MCG/2ML IJ SOLN: 25.0000 ug | INTRAMUSCULAR | Status: DC | PRN

## 2020-08-20 MED ORDER — ACETAMINOPHEN 10 MG/ML IV SOLN: 1000.0000 mg | Freq: Once | INTRAVENOUS | Status: DC | PRN

## 2020-08-20 MED ORDER — ALUM & MAG HYDROXIDE-SIMETH 200-200-20 MG/5ML PO SUSP: 15.0000 mL | ORAL | Status: DC | PRN

## 2020-08-20 MED ORDER — LIDOCAINE HCL (CARDIAC) PF 100 MG/5ML IV SOSY: PREFILLED_SYRINGE | INTRAVENOUS | Status: DC | PRN

## 2020-08-20 MED ORDER — ACETAMINOPHEN 650 MG RE SUPP
325.0000 mg | RECTAL | Status: DC | PRN
Start: 2020-08-20 — End: 2020-08-22

## 2020-08-20 MED ORDER — PROTAMINE SULFATE 10 MG/ML IV SOLN
INTRAVENOUS | Status: DC | PRN
  Administered 2020-08-20: 50 mg via INTRAVENOUS

## 2020-08-20 MED ORDER — PROPOFOL 10 MG/ML IV BOLUS
INTRAVENOUS | Status: AC
  Filled 2020-08-20: qty 40

## 2020-08-20 MED ORDER — DOCUSATE SODIUM 100 MG PO CAPS
100.0000 mg | ORAL_CAPSULE | Freq: Every day | ORAL | Status: DC
  Administered 2020-08-22: 100 mg via ORAL
  Filled 2020-08-20: qty 1

## 2020-08-20 MED ORDER — GABAPENTIN 300 MG PO CAPS
300.0000 mg | ORAL_CAPSULE | Freq: Three times a day (TID) | ORAL | Status: DC
  Administered 2020-08-20 – 2020-08-22 (×6): 300 mg via ORAL
  Filled 2020-08-20 (×6): qty 1

## 2020-08-20 MED ORDER — PROTAMINE SULFATE 10 MG/ML IV SOLN
INTRAVENOUS | Status: AC
  Filled 2020-08-20: qty 5

## 2020-08-20 MED ORDER — ALBUTEROL SULFATE HFA 108 (90 BASE) MCG/ACT IN AERS
2.0000 | INHALATION_SPRAY | RESPIRATORY_TRACT | Status: DC | PRN
  Filled 2020-08-20: qty 6.7

## 2020-08-20 MED ORDER — ACETAMINOPHEN 325 MG PO TABS: 325.0000 mg | ORAL_TABLET | ORAL | Status: DC | PRN

## 2020-08-20 MED ORDER — CHLORHEXIDINE GLUCONATE 0.12 % MT SOLN
15.0000 mL | Freq: Once | OROMUCOSAL | Status: AC
  Administered 2020-08-20: 15 mL via OROMUCOSAL
  Filled 2020-08-20: qty 15

## 2020-08-20 MED ORDER — LOSARTAN POTASSIUM 50 MG PO TABS
50.0000 mg | ORAL_TABLET | Freq: Every day | ORAL | Status: DC
  Administered 2020-08-21 – 2020-08-22 (×2): 50 mg via ORAL
  Filled 2020-08-20 (×2): qty 1

## 2020-08-20 MED ORDER — SODIUM CHLORIDE 0.9 % IV SOLN: INTRAVENOUS | Status: DC | PRN

## 2020-08-20 MED ORDER — CEFAZOLIN SODIUM-DEXTROSE 2-4 GM/100ML-% IV SOLN
2.0000 g | Freq: Three times a day (TID) | INTRAVENOUS | Status: AC
  Administered 2020-08-20 (×2): 2 g via INTRAVENOUS
  Filled 2020-08-20 (×3): qty 100

## 2020-08-20 MED ORDER — POTASSIUM CHLORIDE CRYS ER 20 MEQ PO TBCR
20.0000 meq | EXTENDED_RELEASE_TABLET | Freq: Every day | ORAL | Status: AC | PRN
  Administered 2020-08-20: 40 meq via ORAL
  Filled 2020-08-20: qty 2

## 2020-08-20 MED ORDER — IODIXANOL 320 MG/ML IV SOLN
INTRAVENOUS | Status: DC | PRN
  Administered 2020-08-20: 21 mL

## 2020-08-20 MED ORDER — FUROSEMIDE 40 MG PO TABS
40.0000 mg | ORAL_TABLET | Freq: Every day | ORAL | Status: DC
  Administered 2020-08-21 – 2020-08-22 (×2): 40 mg via ORAL
  Filled 2020-08-20 (×2): qty 1

## 2020-08-20 MED ORDER — PRAVASTATIN SODIUM 40 MG PO TABS
40.0000 mg | ORAL_TABLET | ORAL | Status: DC
  Administered 2020-08-20 – 2020-08-22 (×2): 40 mg via ORAL
  Filled 2020-08-20 (×2): qty 1

## 2020-08-20 MED ORDER — FUROSEMIDE 20 MG PO TABS: 20.0000 mg | ORAL_TABLET | ORAL | Status: DC

## 2020-08-20 MED ORDER — METOPROLOL SUCCINATE ER 25 MG PO TB24
25.0000 mg | ORAL_TABLET | Freq: Once | ORAL | Status: AC
  Filled 2020-08-20: qty 1

## 2020-08-20 MED ORDER — ORAL CARE MOUTH RINSE: 15.0000 mL | Freq: Once | OROMUCOSAL | Status: AC

## 2020-08-20 MED ORDER — LABETALOL HCL 5 MG/ML IV SOLN: 10.0000 mg | INTRAVENOUS | Status: DC | PRN

## 2020-08-20 MED ORDER — LINACLOTIDE 145 MCG PO CAPS
145.0000 ug | ORAL_CAPSULE | Freq: Every day | ORAL | Status: DC
  Administered 2020-08-21 – 2020-08-22 (×2): 145 ug via ORAL
  Filled 2020-08-20 (×2): qty 1

## 2020-08-20 MED ORDER — HEPARIN SODIUM (PORCINE) 1000 UNIT/ML IJ SOLN
INTRAMUSCULAR | Status: DC | PRN
  Administered 2020-08-20: 8000 [IU] via INTRAVENOUS
  Administered 2020-08-20: 2000 [IU] via INTRAVENOUS

## 2020-08-20 MED ORDER — PHENYLEPHRINE HCL-NACL 10-0.9 MG/250ML-% IV SOLN
INTRAVENOUS | Status: DC | PRN
  Administered 2020-08-20: 30 ug/min via INTRAVENOUS
  Administered 2020-08-20: 20 ug/min via INTRAVENOUS

## 2020-08-20 MED ORDER — METFORMIN HCL ER 500 MG PO TB24
500.0000 mg | ORAL_TABLET | Freq: Every day | ORAL | Status: DC
  Administered 2020-08-21: 500 mg via ORAL
  Filled 2020-08-20: qty 1

## 2020-08-20 MED ORDER — CLONIDINE HCL 0.1 MG PO TABS
0.1000 mg | ORAL_TABLET | Freq: Three times a day (TID) | ORAL | Status: DC
  Administered 2020-08-20 – 2020-08-22 (×6): 0.1 mg via ORAL
  Filled 2020-08-20 (×6): qty 1

## 2020-08-20 MED ORDER — SODIUM CHLORIDE 0.9 % IV SOLN: 500.0000 mL | Freq: Once | INTRAVENOUS | Status: DC | PRN

## 2020-08-20 MED ORDER — GUAIFENESIN-DM 100-10 MG/5ML PO SYRP: 15.0000 mL | ORAL_SOLUTION | ORAL | Status: DC | PRN

## 2020-08-20 MED ORDER — SENNOSIDES-DOCUSATE SODIUM 8.6-50 MG PO TABS: 1.0000 | ORAL_TABLET | Freq: Every evening | ORAL | Status: DC | PRN

## 2020-08-20 MED ORDER — OXYCODONE-ACETAMINOPHEN 5-325 MG PO TABS: 1.0000 | ORAL_TABLET | ORAL | Status: DC | PRN

## 2020-08-20 MED ORDER — AMISULPRIDE (ANTIEMETIC) 5 MG/2ML IV SOLN
10.0000 mg | Freq: Once | INTRAVENOUS | Status: DC | PRN
Start: 2020-08-20 — End: 2020-08-20

## 2020-08-20 MED ORDER — ASPIRIN EC 81 MG PO TBEC
81.0000 mg | DELAYED_RELEASE_TABLET | Freq: Every day | ORAL | Status: DC
  Administered 2020-08-21: 81 mg via ORAL
  Filled 2020-08-20: qty 1

## 2020-08-20 MED ORDER — PANTOPRAZOLE SODIUM 40 MG PO TBEC
40.0000 mg | DELAYED_RELEASE_TABLET | Freq: Every day | ORAL | Status: DC
  Administered 2020-08-20 – 2020-08-22 (×3): 40 mg via ORAL
  Filled 2020-08-20 (×3): qty 1

## 2020-08-20 MED ORDER — ONDANSETRON HCL 4 MG/2ML IJ SOLN
INTRAMUSCULAR | Status: DC | PRN
  Administered 2020-08-20: 4 mg via INTRAVENOUS

## 2020-08-20 MED ORDER — 0.9 % SODIUM CHLORIDE (POUR BTL) OPTIME
TOPICAL | Status: DC | PRN
  Administered 2020-08-20: 1000 mL

## 2020-08-20 MED ORDER — METOPROLOL SUCCINATE ER 25 MG PO TB24
ORAL_TABLET | ORAL | Status: AC
  Administered 2020-08-20: 25 mg via ORAL
  Filled 2020-08-20: qty 1

## 2020-08-20 MED ORDER — FENTANYL CITRATE (PF) 250 MCG/5ML IJ SOLN
INTRAMUSCULAR | Status: AC
  Filled 2020-08-20: qty 5

## 2020-08-20 MED ORDER — LIDOCAINE 2% (20 MG/ML) 5 ML SYRINGE
INTRAMUSCULAR | Status: DC | PRN
  Administered 2020-08-20: 60 mg via INTRAVENOUS

## 2020-08-20 MED ORDER — MIDAZOLAM HCL 2 MG/2ML IJ SOLN
INTRAMUSCULAR | Status: AC
  Filled 2020-08-20: qty 2

## 2020-08-20 MED ORDER — FUROSEMIDE 20 MG PO TABS
20.0000 mg | ORAL_TABLET | Freq: Every evening | ORAL | Status: DC
  Administered 2020-08-20 – 2020-08-21 (×2): 20 mg via ORAL
  Filled 2020-08-20 (×2): qty 1

## 2020-08-20 MED ORDER — FLUTICASONE PROPIONATE 50 MCG/ACT NA SUSP
1.0000 | Freq: Every day | NASAL | Status: DC | PRN
  Filled 2020-08-20: qty 16

## 2020-08-20 MED ORDER — LACTATED RINGERS IV SOLN: INTRAVENOUS | Status: DC | PRN

## 2020-08-20 MED ORDER — ROCURONIUM BROMIDE 10 MG/ML (PF) SYRINGE
PREFILLED_SYRINGE | INTRAVENOUS | Status: DC | PRN
  Administered 2020-08-20: 60 mg via INTRAVENOUS

## 2020-08-20 MED ORDER — CEFAZOLIN SODIUM-DEXTROSE 2-4 GM/100ML-% IV SOLN
2.0000 g | INTRAVENOUS | Status: AC
  Administered 2020-08-20: 2 g via INTRAVENOUS
  Filled 2020-08-20: qty 100

## 2020-08-20 MED ORDER — HEPARIN SODIUM (PORCINE) 1000 UNIT/ML IJ SOLN
INTRAMUSCULAR | Status: AC
  Filled 2020-08-20: qty 1

## 2020-08-20 SURGICAL SUPPLY — 66 items
ADH SKN CLS APL DERMABOND .7 (GAUZE/BANDAGES/DRESSINGS) ×1
AGENT HMST SPONGE THK3/8 (HEMOSTASIS)
APL PRP STRL LF DISP 70% ISPRP (MISCELLANEOUS) ×1
BAG SNAP BAND KOVER 36X36 (MISCELLANEOUS) ×2 IMPLANT
CANISTER SUCT 3000ML PPV (MISCELLANEOUS) ×2 IMPLANT
CATH ANGIO BERNSTEIN 5X40X.035 (CATHETERS) ×2 IMPLANT
CATH BEACON 5 .035 65 KMP TIP (CATHETERS) IMPLANT
CATH OMNI FLUSH 5F 65CM (CATHETERS) ×2 IMPLANT
CATH VISIONS PV .035 IVUS (CATHETERS) ×2 IMPLANT
CHLORAPREP W/TINT 26 (MISCELLANEOUS) ×2 IMPLANT
CLIP VESOCCLUDE MED 24/CT (CLIP) ×2 IMPLANT
CLIP VESOCCLUDE SM WIDE 24/CT (CLIP) ×2 IMPLANT
COVER WAND RF STERILE (DRAPES) ×2 IMPLANT
DERMABOND ADVANCED (GAUZE/BANDAGES/DRESSINGS) ×1
DERMABOND ADVANCED .7 DNX12 (GAUZE/BANDAGES/DRESSINGS) ×1 IMPLANT
DRAIN CHANNEL 15F RND FF W/TCR (WOUND CARE) IMPLANT
DRAPE C-ARM 42X72 X-RAY (DRAPES) IMPLANT
ELECT REM PT RETURN 9FT ADLT (ELECTROSURGICAL) ×2
ELECTRODE REM PT RTRN 9FT ADLT (ELECTROSURGICAL) ×1 IMPLANT
EVACUATOR SILICONE 100CC (DRAIN) IMPLANT
GLOVE BIO SURGEON STRL SZ7.5 (GLOVE) ×2 IMPLANT
GLOVE SRG 8 PF TXTR STRL LF DI (GLOVE) ×1 IMPLANT
GLOVE SURG UNDER POLY LF SZ8 (GLOVE) ×2
GOWN STRL REUS W/ TWL LRG LVL3 (GOWN DISPOSABLE) ×3 IMPLANT
GOWN STRL REUS W/ TWL XL LVL3 (GOWN DISPOSABLE) ×2 IMPLANT
GOWN STRL REUS W/TWL LRG LVL3 (GOWN DISPOSABLE) ×6
GOWN STRL REUS W/TWL XL LVL3 (GOWN DISPOSABLE) ×4
HEMOSTAT SPONGE AVITENE ULTRA (HEMOSTASIS) IMPLANT
KIT BASIN OR (CUSTOM PROCEDURE TRAY) ×2 IMPLANT
KIT ENCORE 26 ADVANTAGE (KITS) ×2 IMPLANT
KIT MICROPUNCTURE NIT STIFF (SHEATH) ×2 IMPLANT
KIT TURNOVER KIT B (KITS) ×2 IMPLANT
NS IRRIG 1000ML POUR BTL (IV SOLUTION) ×4 IMPLANT
PACK ENDO MINOR (CUSTOM PROCEDURE TRAY) ×2 IMPLANT
PACK PERIPHERAL VASCULAR (CUSTOM PROCEDURE TRAY) ×2 IMPLANT
PAD ARMBOARD 7.5X6 YLW CONV (MISCELLANEOUS) ×4 IMPLANT
PATCH VASC XENOSURE 1CMX6CM (Vascular Products) ×2 IMPLANT
PATCH VASC XENOSURE 1X6 (Vascular Products) ×1 IMPLANT
SET MICROPUNCTURE 5F STIFF (MISCELLANEOUS) IMPLANT
SHEATH BRITE TIP 7FRX11 (SHEATH) ×2 IMPLANT
SHEATH BRITE TIP 8FR 23CM (SHEATH) ×2 IMPLANT
SHEATH PINNACLE 5F 10CM (SHEATH) ×2 IMPLANT
SHEATH PINNACLE 8F 10CM (SHEATH) ×2 IMPLANT
SLEEVE ISOL F/PACE RF HD COVER (MISCELLANEOUS) ×2 IMPLANT
STENT VIABAHNBX 8X29X135 (Permanent Stent) ×2 IMPLANT
STOPCOCK 3 WAY HIGH PRESSURE (MISCELLANEOUS) ×1
STOPCOCK 3WAY HIGH PRESSURE (MISCELLANEOUS) ×1 IMPLANT
SUT MNCRL AB 4-0 PS2 18 (SUTURE) ×2 IMPLANT
SUT PROLENE 5 0 C 1 24 (SUTURE) ×6 IMPLANT
SUT PROLENE 6 0 BV (SUTURE) IMPLANT
SUT VIC AB 2-0 CT1 27 (SUTURE) ×4
SUT VIC AB 2-0 CT1 TAPERPNT 27 (SUTURE) ×2 IMPLANT
SUT VIC AB 3-0 SH 27 (SUTURE) ×2
SUT VIC AB 3-0 SH 27X BRD (SUTURE) ×1 IMPLANT
SYR MEDRAD MARK V 150ML (SYRINGE) ×4 IMPLANT
TOWEL GREEN STERILE (TOWEL DISPOSABLE) ×2 IMPLANT
TRAY FOLEY MTR SLVR 16FR STAT (SET/KITS/TRAYS/PACK) IMPLANT
TUBING HIGH PRESSURE 120CM (CONNECTOR) IMPLANT
TUBING INJECTOR 48 (MISCELLANEOUS) ×2 IMPLANT
UNDERPAD 30X36 HEAVY ABSORB (UNDERPADS AND DIAPERS) ×2 IMPLANT
WATER STERILE IRR 1000ML POUR (IV SOLUTION) ×2 IMPLANT
WIRE AMPLATZ SS-J .035X180CM (WIRE) IMPLANT
WIRE AMPLATZ SS-J .035X260CM (WIRE) IMPLANT
WIRE BENTSON .035X145CM (WIRE) ×2 IMPLANT
WIRE ROSEN-J .035X260CM (WIRE) ×2 IMPLANT
WIRE STARTER BENTSON 035X150 (WIRE) ×2 IMPLANT

## 2020-08-20 NOTE — Evaluation (Signed)
Physical Therapy Evaluation Patient Details Name: John [name] MRN: [id number] DOB: [birth year] Today's Date: 08/20/2020   History of Present Illness  pt is a 75 y/o male presenting 5/16 with quality of life altering R LE ischemia.  Pt s/p R iliac arteriogram, angioplasty and stenting o fright common iliac artery and R common femoral endarterectomy.  PMHx:  BPH, CHF, COPD, DM, macular degeneration, neuropathy, NSCLC R, PVD, sleep apnea,  Clinical Impression  Pt admitted with/for R LE revascularization as described above.  Pt is experiencing mild pain, but mobilizing at a min guard to light minimal assist at this time..  Pt currently limited functionally due to the problems listed below.  (see problems list.)  Pt will benefit from PT to maximize function and safety to be able to get home safely with available assist.     Follow Up Recommendations No PT follow up;Supervision - Intermittent    Equipment Recommendations  Other (comment) (shower seat if OT agrees.)    Recommendations for Other Services       Precautions / Restrictions Precautions Precautions: Fall      Mobility  Bed Mobility Overal bed mobility: Needs Assistance Bed Mobility: Supine to Sit     Supine to sit: Supervision;HOB elevated     General bed mobility comments: no assist needed, had some trouble boosting with lines.    Transfers Overall transfer level: Needs assistance Equipment used: None Transfers: Sit to/from Stand Sit to Stand: Min guard (from various heights)         General transfer comment: cues for hand placement  Ambulation/Gait Ambulation/Gait assistance: Min assist;Min guard Gait Distance (Feet): 15 Feet (then additional 100 feet mostly holding to IV pole, but some distance without AD) Assistive device: IV Pole;None Gait Pattern/deviations: Step-through pattern Gait velocity: slower Gait velocity interpretation: <1.8 ft/sec, indicate of risk for recurrent falls General Gait  Details: mildlly unsteady when not holding to the IV pole, shorter step length, slower cadence.  HR rising into the 120's, sustained in the 110's.  Sats lower 90's on 3L [place].  Stairs            Wheelchair Mobility    Modified Rankin (Stroke Patients Only)       Balance Overall balance assessment: Mild deficits observed, not formally tested                                           Pertinent Vitals/Pain Pain Assessment: Faces Faces Pain Scale: Hurts a little bit Pain Location: groin incision Pain Descriptors / Indicators: Pressure Pain Intervention(s): Monitored during session    Home Living Family/patient expects to be discharged to:: Private residence Living Arrangements: Spouse/significant other Available Help at Discharge: Family;Available PRN/intermittently Type of Home: House Home Access: Stairs to enter Entrance Stairs-Rails: Psychiatric nurse of Steps: 2 Home Layout: One level Home Equipment: Cane - single point Additional Comments: wife would like a shower seat for pt.    Prior Function Level of Independence: Independent         Comments: Pt reports independent with ambulation in home and community, occasionally uses SPC when needed, independent with ADLs, driving, denies falls. Pt reports spouse normally dons his socks and he uses supplemental O2 PRN.     Hand Dominance        Extremity/Trunk Assessment   Upper Extremity Assessment Upper Extremity Assessment: Overall WFL for tasks assessed  Lower Extremity Assessment Lower Extremity Assessment: Overall WFL for tasks assessed (Not MMT'ed formally,)       Communication   Communication: No difficulties  Cognition Arousal/Alertness: Awake/alert Behavior During Therapy: WFL for tasks assessed/performed Overall Cognitive Status: Within Functional Limits for tasks assessed                                        General Comments       Exercises     Assessment/Plan    PT Assessment    PT Problem List         PT Treatment Interventions      PT Goals (Current goals can be found in the Care Plan section)  Acute Rehab PT Goals Patient Stated Goal: get home soon PT Goal Formulation: With patient Time For Goal Achievement: 08/23/20 Potential to Achieve Goals: Good    Frequency     Barriers to discharge        Co-evaluation               AM-PAC PT "6 Clicks" Mobility  Outcome Measure Help needed turning from your back to your side while in a flat bed without using bedrails?: A Little Help needed moving from lying on your back to sitting on the side of a flat bed without using bedrails?: A Little Help needed moving to and from a bed to a chair (including a wheelchair)?: A Little Help needed standing up from a chair using your arms (e.g., wheelchair or bedside chair)?: A Little Help needed to walk in hospital room?: A Little Help needed climbing 3-5 steps with a railing? : A Little 6 Click Score: 18    End of Session Equipment Utilized During Treatment: Oxygen Activity Tolerance: Patient tolerated treatment well;Patient limited by pain;Patient limited by fatigue Patient left: in bed;with call bell/phone within reach;with family/visitor present;Other (comment) (eating at EOB) Nurse Communication: Mobility status PT Visit Diagnosis: Other abnormalities of gait and mobility (R26.89);Pain;Difficulty in walking, not elsewhere classified (R26.2) Pain - Right/Left: Right Pain - part of body: Leg (groin)    Time: 0076-2263 PT Time Calculation (min) (ACUTE ONLY): 31 min   Charges:   PT Evaluation $PT Eval Moderate Complexity: 1 Mod PT Treatments $Gait Training: 8-22 mins        08/20/2020  Ginger Carne., PT Acute Rehabilitation Services [phone]  (pager) [phone]  (office)  Tessie Fass Keyaira Clapham 08/20/2020, 6:17 PM

## 2020-08-20 NOTE — Progress Notes (Signed)
  Progress Note    08/20/2020 2:57 PM Day of Surgery  Subjective:  Drowsy after surgery. Otherwise no complaints   Vitals:   08/20/20 1400 08/20/20 1445  BP:  130/67  Pulse: 83 83  Resp: 13 13  Temp:  98.4 F (36.9 C)  SpO2: 100% 100%   Physical Exam: Cardiac:  regular Lungs:  Non labored Incisions: right groin incision is clean, dry and intact without swelling or hematoma Extremities:  Well perfused and warm. Doppler right DP/ PT and peroneal signals Abdomen:  Obese, soft, non tender Neurologic: alert and oriented   CBC    Component Value Date/Time   WBC 14.0 (H) 08/20/2020 0544   RBC 4.50 08/20/2020 0544   HGB 11.1 (L) 08/20/2020 0544   HGB 11.6 (L) 08/14/2020 1549   HCT 38.7 (L) 08/20/2020 0544   HCT 37.3 (L) 08/14/2020 1549   PLT 295 08/20/2020 0544   PLT 424 08/14/2020 1549   MCV 86.0 08/20/2020 0544   MCV 80 08/14/2020 1549   MCH 24.7 (L) 08/20/2020 0544   MCHC 28.7 (L) 08/20/2020 0544   RDW 17.6 (H) 08/20/2020 0544   RDW 16.0 (H) 08/14/2020 1549   LYMPHSABS 0.6 (L) 08/14/2020 1549   MONOABS 0.7 08/13/2020 1402   EOSABS 0.1 08/14/2020 1549   BASOSABS 0.0 08/14/2020 1549    BMET    Component Value Date/Time   NA 137 08/20/2020 0544   NA 142 08/14/2020 1549   K 3.3 (L) 08/20/2020 0544   CL 103 08/20/2020 0544   CO2 25 08/20/2020 0544   GLUCOSE 198 (H) 08/20/2020 0544   BUN 19 08/20/2020 0544   BUN 21 08/14/2020 1549   CREATININE 0.95 08/20/2020 0544   CALCIUM 8.9 08/20/2020 0544   GFRNONAA >60 08/20/2020 0544   GFRAA 102 05/24/2020 1238    INR    Component Value Date/Time   INR 1.0 08/14/2020 1123     Intake/Output Summary (Last 24 hours) at 08/20/2020 1457 Last data filed at 08/20/2020 1003 Gross per 24 hour  Intake 1300 ml  Output 50 ml  Net 1250 ml     Assessment/Plan:  75 y.o. male is s/p 1.  Intravascular ultrasound (IVUS) of right external iliac artery, right common iliac artery, and infrarenal aorta 2.  Right iliac  arteriogram 3.  Angioplasty and stent of right common iliac artery (8 mm x 29 mm VBX) 4.  Right common femoral endarterectomy with profundoplasty and bovine pericardial patch angioplasty Day of Surgery  He is doing well post op. Right groin incision intact without swelling or hematoma. Right lower extremity is well perfused and warm with Doppler Dp/ PT and peroneal signals. He will need to be on Aspirin, Statin and Plavix. Advance diet as tolerated. OOB and mobilize later this evening   Karoline Caldwell, [facility] [phone] 08/20/2020 2:57 PM

## 2020-08-20 NOTE — Anesthesia Postprocedure Evaluation (Signed)
Anesthesia Post Note  Patient: John [name]  Procedure(s) Performed: ENDARTERECTOMY  RIGHT FEMORAL ARTERY (Right Groin) RETROGRADE INSERTION OF RIGHT ILIAC STENT (Right Groin) PATCH ANGIOPLASTY RIGHT FEMORAL ARTERY (Right Groin) INTRA OPERATIVE ARTERIOGRAM ILIAC (Right Groin) INTRAVASCULAR ULTRASOUND (Right Groin)     Patient location during evaluation: PACU Anesthesia Type: General Level of consciousness: awake and alert Pain management: pain level controlled Vital Signs Assessment: post-procedure vital signs reviewed and stable Respiratory status: spontaneous breathing, nonlabored ventilation, respiratory function stable and patient connected to nasal cannula oxygen Cardiovascular status: blood pressure returned to baseline and stable Postop Assessment: no apparent nausea or vomiting Anesthetic complications: no   No complications documented.  Last Vitals:  Vitals:   08/20/20 1400 08/20/20 1445  BP:  130/67  Pulse: 83 83  Resp: 13 13  Temp:  36.9 C  SpO2: 100% 100%    Last Pain:  Vitals:   08/20/20 1445  TempSrc: Oral  PainSc:                  [name] Gerrick Ray

## 2020-08-20 NOTE — Transfer of Care (Signed)
Immediate Anesthesia Transfer of Care Note  Patient: John [name]  Procedure(s) Performed: ENDARTERECTOMY  RIGHT FEMORAL ARTERY (Right Groin) RETROGRADE INSERTION OF RIGHT ILIAC STENT (Right Groin) PATCH ANGIOPLASTY RIGHT FEMORAL ARTERY (Right Groin) INTRA OPERATIVE ARTERIOGRAM ILIAC (Right Groin) INTRAVASCULAR ULTRASOUND (Right Groin)  Patient Location: PACU  Anesthesia Type:General  Level of Consciousness: drowsy  Airway & Oxygen Therapy: Patient Spontanous Breathing and Patient connected to face mask oxygen  Post-op Assessment: Report given to RN and Post -op Vital signs reviewed and stable  Post vital signs: Reviewed and stable  Last Vitals:  Vitals Value Taken Time  BP 142/64 08/20/20 1018  Temp    Pulse 82 08/20/20 1019  Resp 16 08/20/20 1020  SpO2 98 % 08/20/20 1019  Vitals shown include unvalidated device data.  Last Pain:  Vitals:   08/20/20 0550  TempSrc: Oral         Complications: No complications documented.

## 2020-08-20 NOTE — Op Note (Signed)
Date: Aug 20, 2020  Preoperative diagnosis: Right lower extremity short distance lifestyle limiting claudication  Postoperative diagnosis: Same  Procedure: 1.  Intravascular ultrasound (IVUS) of right external iliac artery, right common iliac artery, and infrarenal aorta 2.  Right iliac arteriogram 3.  Angioplasty and stent of right common iliac artery (8 mm x 29 mm VBX) 4.  Right common femoral endarterectomy with profundoplasty and bovine pericardial patch angioplasty  Surgeon: Dr. Marty Heck, MD  Assistant: Paulo Fruit, PA  Indications: Patient is a 75 year old male who was seen initially by cardiology under the care of Dr. Gwenlyn Found with lower extremity lifestyle limiting claudication.  He previously underwent aortogram and lower extremity arteriogram that showed high-grade calcified right common iliac artery stenosis as well as high-grade right common femoral artery stenosis.  Vascular surgery was subsequently consulted.  Ultimately surgery was delayed given he did require TURP and recently had a indwelling Foley catheter with some urinary sepsis that he has since recovered.  He presents today after risk benefits discussed.  An assistant was needed for exposure and to expedite the case.  Findings: After cutdown on the right groin there was really no good pulse proximal to the common femoral lesion.  As a result, I elected to stent the right common iliac artery high-grade stenosis that appeared greater than 90% by IVUS with an 8 mm x 29 mm VBX.  We then had much better inflow and common femoral endarterectomy was performed including of the proximal SFA and profunda and bovine pericardial patch was sewn.  He had a palpable right common femoral pulse and very brisk dorsalis pedis signal at the foot upon completion.  Anesthesia: General  EBL: Less than 100 mL  Details: Patient was taken to the operating room after informed consent was obtained.  Placed on the operative table in the  supine position and bilateral groins as well as abdominal wall was prepped and draped in usual sterile fashion.  Timeout was performed.  Antibiotics were administered.  Initially made a horizontal groin incision above the right inguinal crease.  Dissected down with Bovie cautery and opened the subcutaneous tissue with Bovie and the femoral sheath was opened longitudinally.  There was a large calcified plaque in the middle of the right common femoral artery as demonstrated on arteriogram.  Proximal to this lesion he did not have very good pulse which I felt was related to his known right common iliac high-grade stenosis.  I subsequently dissected out the SFA profunda and controlled these with Vesseloops as well as distal external iliac controlled it with a vessel loop.  Patient was given 100 units per kilogram IV heparin.  Once that circulated for 2 minutes we checked an ACT to maintain greater than 250.  Vesseloops were then used to control the right common femoral artery and it was opened 1 blade scalpel extended with Potts scissors.  I then passed a Bentson wire proximally in the iliac where I could visualize the true lumen and placed a short 7 French sheath and then had used a KMP catheter to cross the right common iliac stenosis retrograde into the native aorta.  I then performed intravascular ultrasound and IVUS was performed of the infrarenal aorta including the right common and external iliac after we exchanged for an 8 French sheath.  We selected the location for stent deployment and this confirmed a greater than 90% high-grade stenosis in right common iliac artery.  We had some trouble visualizing the hypogastric so I did perform  a retrograde iliac arteriogram and then ultimately a 8 mm x 29 mm VBX was deployed in the right common iliac artery with excellent results.  I did get a retrograde shot to confirm no dissection and no active extravasation with no residual stenosis and good stent deployment after  the stent was placed.  I then used Garment/textile technologist and the common femoral was endarterectomized including eversion endarterectomy of the proximal SFA and profunda.  We appear to have good endpoints on visualization.  At that point time I then brought a bovine pericardial patch on the field and this was sewn to the right common femoral with a 5-0 Prolene parachute technique.  Just prior to completing the patch we had excellent inflow and elected to remove our wire.  We then completed the patch and again flushed everything prior to completion.  There was palpable right femoral pulse with triphasic flow in the profunda.  We also had excellent Doppler flow in the SFA.  Patient had a brisk dorsalis pedis signal in the foot.  Protamine was given for reversal.  The incision was irrigated out and closed in multiple layers of 2-0 Vicryl, 3-0 Vicryl, 4-0 Monocryl and Dermabond.  Complication: None  Condition: Stable  Marty Heck, MD [facility] Office: [place]

## 2020-08-20 NOTE — Anesthesia Procedure Notes (Signed)
Procedure Name: Intubation Date/Time: 08/20/2020 7:58 AM Performed by: Donnelly Angelica, RN Pre-anesthesia Checklist: Patient identified, Emergency Drugs available, Suction available and Patient being monitored Patient Re-evaluated:Patient Re-evaluated prior to induction Oxygen Delivery Method: Circle System Utilized Preoxygenation: Pre-oxygenation with 100% oxygen Induction Type: IV induction Ventilation: Mask ventilation without difficulty Laryngoscope Size: Mac and 3 Grade View: Grade I Tube type: Oral Tube size: 7.5 mm Number of attempts: 1 Airway Equipment and Method: Stylet and Oral airway Placement Confirmation: ETT inserted through vocal cords under direct vision,  positive ETCO2 and breath sounds checked- equal and bilateral Secured at: 22 cm Tube secured with: Tape Dental Injury: Teeth and Oropharynx as per pre-operative assessment

## 2020-08-20 NOTE — Progress Notes (Signed)
Spoke to Dr. Gloris Manchester regarding giving Metoprolol.  Ok to give with current vital signs and he is aware Metoprolol listed as allegry with reaction being fatigue.

## 2020-08-20 NOTE — H&P (Signed)
Patient name: John [name]           MRN: [id number]        DOB: [birth year]          Sex: male  REASON FOR CONSULT: Follow-up to discuss right femoral endarterectomy and possible right iliac stent  HPI: John [name] is a 75 y.o. male, with COPD, HTN, HLD, DM, PAD who initially presented as a referral from Dr. Gwenlyn Found for evaluation of right femoral endarterectomy.  Ultimately patient endorsed several years of claudication in the right lower extremity.  He got severe burning in the right thigh after only walking about 20 to 25 feet.  He recently underwent arteriogram with Dr. Gwenlyn Found on 02/06/2020 that showed a 95% right common iliac stenosis as well as a 90% right common femoral stenosis and a high-grade profunda stenosis and also high grade right mid SFA stenosis   He was scheduled for a right femoral endarterectomy and possible retrograde iliac stent early this month with me but his surgery was canceled after he was admitted with UTI sepsis.  He still has chronic indwelling Foley catheter now and is now scheduled for TURP on February 24.  He states his right leg is about the same not having any rest pain symptoms or wounds.  He would like to get his prostate surgery done first and get his catheter removed prior to getting his surgery with [place].      Past Medical History:  Diagnosis Date  . Adenocarcinoma of lung, right ([place]) 04/18/2016  . Anxiety   . Arthritis   . Asthma   . BPH (benign prostatic hyperplasia)    with urinary retention 02/06/20  . CHF (congestive heart failure) ([place])   . COPD (chronic obstructive pulmonary disease) ([place])   . Depression   . Diabetes mellitus without complication (HCC)    no meds  . DM (diabetes mellitus) ([place]) 07/09/2016  . Dyspnea   . History of kidney stones   . Hyperlipidemia   . Hypertension   . Hypothyroidism   . Macular degeneration   . Neuropathy   . Non-small cell lung cancer, right ([place]) 04/18/2016  . Peripheral vascular disease  ([place])   . Prostatitis   . Pulmonary nodule, left 07/16/2016  . Sleep apnea    cpap         Past Surgical History:  Procedure Laterality Date  . ABDOMINAL AORTOGRAM W/LOWER EXTREMITY Left 02/06/2020   Procedure: ABDOMINAL AORTOGRAM W/LOWER EXTREMITY;  Surgeon: Lorretta Harp, MD;  Location: [facility];  Service: Cardiovascular;  Laterality: Left;  . CATARACT EXTRACTION, BILATERAL Bilateral   . NO PAST SURGERIES    . PORTACATH PLACEMENT Left 06/13/2016   Procedure: INSERTION PORT-A-CATH;  Surgeon: Aviva Signs, MD;  Location: [facility];  Service: General;  Laterality: Left;  Marland Kitchen VIDEO BRONCHOSCOPY WITH ENDOBRONCHIAL NAVIGATION N/A 05/28/2016   Procedure: VIDEO BRONCHOSCOPY WITH ENDOBRONCHIAL NAVIGATION;  Surgeon: [name], MD;  Location: [facility];  Service: Thoracic;  Laterality: N/A;  . VIDEO BRONCHOSCOPY WITH ENDOBRONCHIAL ULTRASOUND N/A 05/28/2016   Procedure: VIDEO BRONCHOSCOPY WITH ENDOBRONCHIAL ULTRASOUND;  Surgeon: [name], MD;  Location: [facility];  Service: Thoracic;  Laterality: N/A;    Family History  Problem Relation Age of Onset  . Hypertension Mother   . Diabetes Father   . Heart disease Father   . Stroke Father   . Hypertension Sister     SOCIAL HISTORY: Social History        Socioeconomic History  .  Marital status: Divorced    Spouse name: Not on file  . Number of children: 5  . Years of education: 10  . Highest education level: 10th grade  Occupational History  . Occupation: Retired  Tobacco Use  . Smoking status: Current Every Day Smoker    Packs/day: 0.50    Years: 55.00    Pack years: 27.50    Types: Cigarettes    Start date: 03/11/1961  . Smokeless tobacco: Never Used  . Tobacco comment: 1/2 pack to 1 pack per day 12/14/19  Vaping Use  . Vaping Use: Never used  Substance and Sexual Activity  . Alcohol use: No  . Drug use: No  . Sexual activity: Yes    Birth control/protection: None  Other  Topics Concern  . Not on file  Social History Narrative  . Not on file   Social Determinants of Health      Financial Resource Strain: Low Risk   . Difficulty of Paying Living Expenses: Not hard at all  Food Insecurity: No Food Insecurity  . Worried About Charity fundraiser in the Last Year: Never true  . Ran Out of Food in the Last Year: Never true  Transportation Needs: No Transportation Needs  . Lack of Transportation (Medical): No  . Lack of Transportation (Non-Medical): No  Physical Activity: Inactive  . Days of Exercise per Week: 0 days  . Minutes of Exercise per Session: 0 min  Stress: No Stress Concern Present  . Feeling of Stress : Not at all  Social Connections: Moderately Isolated  . Frequency of Communication with Friends and Family: More than three times a week  . Frequency of Social Gatherings with Friends and Family: More than three times a week  . Attends Religious Services: Never  . Active Member of Clubs or Organizations: No  . Attends Archivist Meetings: Never  . Marital Status: Living with partner  Intimate Partner Violence: Not At Risk  . Fear of Current or Ex-Partner: No  . Emotionally Abused: No  . Physically Abused: No  . Sexually Abused: No         Allergies  Allergen Reactions  . Jardiance [Empagliflozin]     FEELS SLUGGISH, TIRED  . Lopressor [Metoprolol]     Fatigue          Current Outpatient Medications  Medication Sig Dispense Refill  . amLODipine (NORVASC) 5 MG tablet Take 1 tablet (5 mg total) by mouth daily. 90 tablet 3  . aspirin EC 81 MG tablet Take 81 mg by mouth daily.    . Blood Glucose Monitoring Suppl (ONETOUCH VERIO REFLECT) w/Device KIT Use to test blood sugars daily as directed. DX: E11.9 1 kit 1  . cloNIDine (CATAPRES) 0.1 MG tablet TAKE 1 TABLET (0.1 MG TOTAL) BY MOUTH 3 (THREE) TIMES DAILY. 270 tablet 1  . doxycycline (VIBRA-TABS) 100 MG tablet Take 1 tablet (100 mg total) by mouth 2 (two)  times daily. 1 po bid 20 tablet 0  . dutasteride (AVODART) 0.5 MG capsule TAKE 1 CAPSULE BY MOUTH EVERY DAY (Patient taking differently: Take 0.5 mg by mouth daily.) 90 capsule 1  . furosemide (LASIX) 20 MG tablet Take 1 tablet (20 mg total) by mouth 2 (two) times daily. (Patient taking differently: Take 20 mg by mouth daily.) 180 tablet 1  . gabapentin (NEURONTIN) 300 MG capsule Take 1 capsule (300 mg total) by mouth 3 (three) times daily. (Patient taking differently: Take 300-600 mg by mouth See  admin instructions. Take 1 capsule (300 mg) by mouth in the morning & take 2 capsules (600 mg) by mouth in the evening.) 270 capsule 2  . glucose blood (ONETOUCH VERIO) test strip Use to test blood sugars daily as directed. DX: E11.9 100 each 12  . levothyroxine (SYNTHROID) 75 MCG tablet Take 1 tablet (75 mcg total) by mouth daily. 90 tablet 3  . linaclotide (LINZESS) 72 MCG capsule Take 1 capsule (72 mcg total) by mouth daily before breakfast. (Patient taking differently: Take 72 mcg by mouth daily as needed (constipation.).) 30 capsule 2  . meloxicam (MOBIC) 7.5 MG tablet Take 1 tablet (7.5 mg total) by mouth daily as needed for pain (back pain). 90 tablet 0  . metFORMIN (GLUCOPHAGE-XR) 500 MG 24 hr tablet Take 1 tablet (500 mg total) by mouth daily with breakfast. 90 tablet 1  . metoprolol succinate (TOPROL XL) 25 MG 24 hr tablet Take 1 tablet (25 mg total) by mouth daily. 90 tablet 3  . mupirocin ointment (BACTROBAN) 2 % Apply 1 application topically 3 (three) times daily as needed (wound care.).    Marland Kitchen olmesartan (BENICAR) 40 MG tablet Take 1 tablet (40 mg total) by mouth daily. 30 tablet 11  . ondansetron (ZOFRAN ODT) 4 MG disintegrating tablet Take 1 tablet (4 mg total) by mouth every 8 (eight) hours as needed for nausea or vomiting. 20 tablet 0  . OneTouch Delica Lancets 85Y MISC Use to test blood sugars daily as directed. DX: E11.9 100 each 1  . pravastatin (PRAVACHOL) 40 MG tablet Take 1 tablet (40  mg total) by mouth every Monday, Wednesday, and Friday. At night 90 tablet 1  . Semaglutide (RYBELSUS) 7 MG TABS Take 7 mg by mouth daily. 90 tablet 1  . tamsulosin (FLOMAX) 0.4 MG CAPS capsule Take 2 capsules (0.8 mg total) by mouth daily after supper. 120 capsule 1  . TRELEGY ELLIPTA 100-62.5-25 MCG/INH AEPB Inhale 1 puff into the lungs daily. 1 each 3  . albuterol (VENTOLIN HFA) 108 (90 Base) MCG/ACT inhaler Inhale 2 puffs into the lungs every 4 (four) hours as needed for wheezing or shortness of breath. (Patient not taking: No sig reported) 8 g 6   No current facility-administered medications for this visit.    REVIEW OF SYSTEMS:  [X] denotes positive finding, [ ] denotes negative finding Cardiac  Comments:  Chest pain or chest pressure:    Shortness of breath upon exertion:    Short of breath when lying flat:    Irregular heart rhythm:        Vascular    Pain in calf, thigh, or hip brought on by ambulation:    Pain in feet at night that wakes you up from your sleep:     Blood clot in your veins:    Leg swelling:         Pulmonary    Oxygen at home:    Productive cough:     Wheezing:         Neurologic    Sudden weakness in arms or legs:     Sudden numbness in arms or legs:     Sudden onset of difficulty speaking or slurred speech:    Temporary loss of vision in one eye:     Problems with dizziness:         Gastrointestinal    Blood in stool:     Vomited blood:         Genitourinary  Burning when urinating:     Blood in urine:        Psychiatric    Major depression:         Hematologic    Bleeding problems:    Problems with blood clotting too easily:        Skin    Rashes or ulcers:        Constitutional    Fever or chills:      PHYSICAL EXAM:    Vitals:   05/01/20 1451  BP: (!) 146/73  Pulse: 98  Resp: 16  Temp: 98 F (36.7 C)  TempSrc:  Temporal  SpO2: 94%  Weight: 171 lb (77.6 kg)  Height: 5' 6" (1.676 m)    GENERAL: The patient is a well-nourished male, in no acute distress. The vital signs are documented above. CARDIAC: There is a regular rate and rhythm.  VASCULAR:  Left femoral pulse palpable Right femoral pulse weak but palpable PULMONARY: No respiratory distress ABDOMEN: Soft and non-tender. MUSCULOSKELETAL: There are no major deformities or cyanosis. NEUROLOGIC: No focal weakness or paresthesias are detected. Urology: Chronic foley   DATA:   Previously reviewed aortogram that shows right common iliac artery calcified high grade stenosis as well as right common femoral calcified high grade stenosis and proximal profunda stenosis and right SFA high grade stenosis.  Assessment/Plan:  Plan right femoral endarterectomy and possible retrograde right iliac stent.  Risks and benefits discussed.  WBC improving.  No active signs of infection.  Has stopped steroids for bronchitis.      Marty Heck, MD [facility] Office: [phone]

## 2020-08-20 NOTE — Anesthesia Procedure Notes (Addendum)
Arterial Line Insertion Start/End5/16/2022 7:25 AM, 08/20/2020 7:30 AM Performed by: Darral Dash, DO, Georgia Duff, CRNA, anesthesiologist  Lidocaine 1% used for infiltration Right, radial was placed Catheter size: 20 G  Attempts: 4 Procedure performed using ultrasound guided technique. Ultrasound Notes:anatomy identified, needle tip was noted to be adjacent to the nerve/plexus identified and no ultrasound evidence of intravascular and/or intraneural injection Following insertion, dressing applied and Biopatch. Post procedure complications: second provider assisted. Patient tolerated the procedure well with no immediate complications. Additional procedure comments: Unsuccessful attempts left radially by CRNA/SRNA team. Single attempt right radially by self under US guidance. Marland Kitchen

## 2020-08-21 ENCOUNTER — Encounter (HOSPITAL_COMMUNITY): Payer: Self-pay | Admitting: Vascular Surgery

## 2020-08-21 ENCOUNTER — Inpatient Hospital Stay (HOSPITAL_COMMUNITY): Payer: Medicare Other

## 2020-08-21 ENCOUNTER — Other Ambulatory Visit: Payer: Self-pay

## 2020-08-21 DIAGNOSIS — I483 Typical atrial flutter: Secondary | ICD-10-CM

## 2020-08-21 DIAGNOSIS — I4891 Unspecified atrial fibrillation: Secondary | ICD-10-CM | POA: Diagnosis not present

## 2020-08-21 LAB — CBC
HCT: 36.3 % — ABNORMAL LOW (ref 39.0–52.0)
Hemoglobin: 10.2 g/dL — ABNORMAL LOW (ref 13.0–17.0)
MCH: 24.5 pg — ABNORMAL LOW (ref 26.0–34.0)
MCHC: 28.1 g/dL — ABNORMAL LOW (ref 30.0–36.0)
MCV: 87.1 fL (ref 80.0–100.0)
Platelets: 260 10*3/uL (ref 150–400)
RBC: 4.17 MIL/uL — ABNORMAL LOW (ref 4.22–5.81)
RDW: 18.3 % — ABNORMAL HIGH (ref 11.5–15.5)
WBC: 15.4 10*3/uL — ABNORMAL HIGH (ref 4.0–10.5)
nRBC: 0 % (ref 0.0–0.2)

## 2020-08-21 LAB — ECHOCARDIOGRAM COMPLETE
AR max vel: 0.96 cm2
AV Area VTI: 0.91 cm2
AV Area mean vel: 0.91 cm2
AV Mean grad: 8 mmHg
AV Peak grad: 14.1 mmHg
Ao pk vel: 1.88 m/s
Area-P 1/2: 6.83 cm2
Height: 66 in
MV VTI: 1.07 cm2
S' Lateral: 2.7 cm
Weight: 2633.6 oz

## 2020-08-21 LAB — GLUCOSE, CAPILLARY
Glucose-Capillary: 164 mg/dL — ABNORMAL HIGH (ref 70–99)
Glucose-Capillary: 155 mg/dL — ABNORMAL HIGH (ref 70–99)
Glucose-Capillary: 158 mg/dL — ABNORMAL HIGH (ref 70–99)
Glucose-Capillary: 105 mg/dL — ABNORMAL HIGH (ref 70–99)

## 2020-08-21 LAB — LIPID PANEL
Cholesterol: 126 mg/dL (ref 0–200)
HDL: 28 mg/dL — ABNORMAL LOW (ref >40)
LDL Cholesterol: 80 mg/dL (ref 0–99)
Total CHOL/HDL Ratio: 4.5 RATIO
Triglycerides: 89 mg/dL (ref <150)
VLDL: 18 mg/dL (ref 0–40)

## 2020-08-21 LAB — BASIC METABOLIC PANEL
Anion gap: 7 (ref 5–15)
BUN: 17 mg/dL (ref 8–23)
CO2: 26 mmol/L (ref 22–32)
Calcium: 8.6 mg/dL — ABNORMAL LOW (ref 8.9–10.3)
Chloride: 106 mmol/L (ref 98–111)
Creatinine, Ser: 1.14 mg/dL (ref 0.61–1.24)
GFR, Estimated: >60 mL/min (ref >60)
Glucose, Bld: 154 mg/dL — ABNORMAL HIGH (ref 70–99)
Potassium: 4.3 mmol/L (ref 3.5–5.1)
Sodium: 139 mmol/L (ref 135–145)

## 2020-08-21 LAB — POCT ACTIVATED CLOTTING TIME
Activated Clotting Time: 237 seconds
Activated Clotting Time: 255 seconds

## 2020-08-21 LAB — MAGNESIUM: Magnesium: 2 mg/dL (ref 1.7–2.4)

## 2020-08-21 LAB — TSH: TSH: 0.628 u[IU]/mL (ref 0.350–4.500)

## 2020-08-21 MED ORDER — METOPROLOL SUCCINATE ER 25 MG PO TB24
37.5000 mg | ORAL_TABLET | Freq: Every day | ORAL | Status: DC
  Administered 2020-08-21 – 2020-08-22 (×2): 37.5 mg via ORAL
  Filled 2020-08-21 (×2): qty 2

## 2020-08-21 MED ORDER — EZETIMIBE 10 MG PO TABS
10.0000 mg | ORAL_TABLET | Freq: Every day | ORAL | Status: DC
  Administered 2020-08-21 – 2020-08-22 (×2): 10 mg via ORAL
  Filled 2020-08-21 (×2): qty 1

## 2020-08-21 MED ORDER — CLOPIDOGREL BISULFATE 75 MG PO TABS: 75.0000 mg | ORAL_TABLET | Freq: Every day | ORAL | 11 refills | Status: DC

## 2020-08-21 MED ORDER — HYDROCODONE-ACETAMINOPHEN 5-325 MG PO TABS: 1.0000 | ORAL_TABLET | Freq: Four times a day (QID) | ORAL | 0 refills | Status: DC | PRN

## 2020-08-21 MED ORDER — APIXABAN 5 MG PO TABS
5.0000 mg | ORAL_TABLET | Freq: Two times a day (BID) | ORAL | Status: DC
  Administered 2020-08-21 – 2020-08-22 (×2): 5 mg via ORAL
  Filled 2020-08-21 (×2): qty 1

## 2020-08-21 NOTE — Discharge Summary (Addendum)
Discharge Summary     John [name] 30-Oct-1945 75 y.o. male  [id number]  Admission Date: 08/20/2020  Discharge Date: 08/21/2020  Physician: Marty Heck, MD  Admission Diagnosis: Peripheral arterial disease [facility]) [I73.9]  HPI:   This is a 75 y.o. male with COPD, HTN, HLD, DM, PAD whoinitiallypresentedas a referral from Dr. Gwenlyn Found for evaluation of right femoral endarterectomy. Ultimately patient endorsedseveral years of claudication in the right lower extremity. He got severe burning in the right thigh after only walking about 20 to 25 feet. He recently underwent arteriogram with Dr. Gwenlyn Found on 02/06/2020 that showed a 95% right common iliac stenosis as well as a 90% right common femoral stenosis and a high-grade profunda stenosis and also high grade right mid SFA stenosis   He was scheduled for a right femoral endarterectomy and possible retrograde iliac stent early this monthwith mebut his surgery was canceled after he was admitted with UTI sepsis. He stillhas chronic indwelling Foley catheter nowandis now scheduled for TURP on February 24. He states his right leg is about the same not having any rest pain symptoms or wounds. He would like to get his prostate surgery done first and get his catheter removed prior to getting his surgery with [place].  Hospital Course:  The patient was admitted to the hospital and taken to the operating room on 08/20/2020 and underwent: 1.  Intravascular ultrasound (IVUS) of right external iliac artery, right common iliac artery, and infrarenal aorta 2.  Right iliac arteriogram 3.  Angioplasty and stent of right common iliac artery (8 mm x 29 mm VBX) 4.  Right common femoral endarterectomy with profundoplasty and bovine pericardial patch angioplasty    Findings: After cutdown on the right groin there was really no good pulse proximal to the common femoral lesion.  As a result, I elected to stent the right common iliac artery  high-grade stenosis that appeared greater than 90% by IVUS with an 8 mm x 29 mm VBX.  We then had much better inflow and common femoral endarterectomy was performed including of the proximal SFA and profunda and bovine pericardial patch was sewn.  He had a palpable right common femoral pulse and very brisk dorsalis pedis signal at the foot upon completion.  The pt tolerated the procedure well and was transported to the PACU in good condition.   By POD 1, he was doing well.  He had brisk right DP/PT doppler signals and a weakly palpable right DP pulse.  He had walked.  PT recommending tub seat.  TOC consulted for home needs.  Pt discharged home today.     CBC    Component Value Date/Time   WBC 15.4 (H) 08/21/2020 0530   RBC 4.17 (L) 08/21/2020 0530   HGB 10.2 (L) 08/21/2020 0530   HGB 11.6 (L) 08/14/2020 1549   HCT 36.3 (L) 08/21/2020 0530   HCT 37.3 (L) 08/14/2020 1549   PLT 260 08/21/2020 0530   PLT 424 08/14/2020 1549   MCV 87.1 08/21/2020 0530   MCV 80 08/14/2020 1549   MCH 24.5 (L) 08/21/2020 0530   MCHC 28.1 (L) 08/21/2020 0530   RDW 18.3 (H) 08/21/2020 0530   RDW 16.0 (H) 08/14/2020 1549   LYMPHSABS 0.6 (L) 08/14/2020 1549   MONOABS 0.7 08/13/2020 1402   EOSABS 0.1 08/14/2020 1549   BASOSABS 0.0 08/14/2020 1549    BMET    Component Value Date/Time   NA 139 08/21/2020 0530   NA 142 08/14/2020 1549   K  4.3 08/21/2020 0530   CL 106 08/21/2020 0530   CO2 26 08/21/2020 0530   GLUCOSE 154 (H) 08/21/2020 0530   BUN 17 08/21/2020 0530   BUN 21 08/14/2020 1549   CREATININE 1.14 08/21/2020 0530   CALCIUM 8.6 (L) 08/21/2020 0530   GFRNONAA >60 08/21/2020 0530   GFRAA 102 05/24/2020 1238     Discharge Instructions    Discharge patient   Complete by: As directed    Discharge home once [facility] has arranged for DME equipment   Discharge disposition: 01-Home or Self Care   Discharge patient date: 08/21/2020      Discharge Diagnosis:  Peripheral arterial disease ([place])  [I73.9]  Secondary Diagnosis: Patient Active Problem List   Diagnosis Date Noted  . Peripheral arterial disease ([place]) 08/20/2020  . Dysuria 07/27/2020  . Sepsis due to Escherichia coli (E. coli) ([place]) 05/07/2020  . Hypokalemia 05/06/2020  . Hyperglycemia due to diabetes mellitus ([place]) 05/06/2020  . Hypoalbuminemia 05/06/2020  . Prolonged QT interval 05/06/2020  . Lactic acidosis 05/06/2020  . Bronchitis due to tobacco use 05/06/2020  . Acute respiratory failure with hypoxia ([place]) 05/06/2020  . Acute on chronic respiratory failure with hypoxia ([place]) 05/06/2020  . COPD with acute exacerbation ([place]) 05/06/2020  . Tobacco abuse 05/06/2020  . Urinary retention 03/09/2020  . Acute cystitis with hematuria   . Urinary tract infection with hematuria   . Dyspnea   . Hypoxia   . Sepsis secondary to UTI ([place]) 03/03/2020  . COPD GOLD III/ group D still smoking  10/24/2019  . PAD (peripheral artery disease) ([place]) 09/13/2019  . Obstructive sleep apnea treated with continuous positive airway pressure (CPAP) 05/31/2018  . Diabetic neuropathy ([place]) 12/29/2017  . Cigarette smoker 12/29/2017  . Hyperlipidemia associated with type 2 diabetes mellitus ([place]) 12/29/2017  . Primary insomnia 12/29/2017  . Primary osteoarthritis involving multiple joints 12/29/2017  . OSA (obstructive sleep apnea) 12/10/2017  . Hypothyroidism 06/09/2017  . Joint pain 08/05/2016  . Bilateral hand swelling 08/05/2016  . Hyponatremia 08/05/2016  . Pulmonary nodule, left 07/16/2016  . CHF (congestive heart failure) ([place]) 07/09/2016  . DM (diabetes mellitus) ([place]) 07/09/2016  . Non-small cell lung cancer, right ([place]) 04/18/2016  . Essential hypertension 02/01/2016  . COPD (chronic obstructive pulmonary disease) ([place]) 02/01/2016  . BPH with urinary obstruction 02/01/2016   Past Medical History:  Diagnosis Date  . Adenocarcinoma of lung, right ([place]) 04/18/2016  . Anxiety   . Arthritis   . Asthma   . BPH (benign  prostatic hyperplasia)    with urinary retention 02/06/20  . CHF (congestive heart failure) ([place])   . COPD (chronic obstructive pulmonary disease) ([place])   . Depression   . Diabetes mellitus without complication (HCC)    no meds  . Diabetes mellitus, type II ([place])   . DM (diabetes mellitus) ([place]) 07/09/2016  . Dyspnea   . History of kidney stones   . Hyperlipidemia   . Hypertension   . Hypertension   . Hypothyroidism   . Macular degeneration   . Neuropathy   . Non-small cell lung cancer, right ([place]) 04/18/2016  . Peripheral vascular disease ([place])   . Prostatitis   . Pulmonary nodule, left 07/16/2016  . Sleep apnea    cpap     Allergies as of 08/21/2020      Reactions   Jardiance [empagliflozin] Other (See Comments)   FEELS SLUGGISH, TIRED   Lopressor [metoprolol] Other (See Comments)   Fatigue   Sulfa Antibiotics Swelling  Mouth swelling      Medication List    TAKE these medications   albuterol 108 (90 Base) MCG/ACT inhaler Commonly known as: VENTOLIN HFA Inhale 2 puffs into the lungs every 4 (four) hours as needed for wheezing or shortness of breath.   aspirin EC 81 MG tablet Take 81 mg by mouth daily.   bacitracin 500 UNIT/GM ointment Apply 1 application topically 2 (two) times daily.   cloNIDine 0.1 MG tablet Commonly known as: CATAPRES TAKE 1 TABLET (0.1 MG TOTAL) BY MOUTH 3 (THREE) TIMES DAILY.   clopidogrel 75 MG tablet Commonly known as: PLAVIX Take 1 tablet (75 mg total) by mouth daily at 6 (six) AM. Start taking on: Aug 22, 2020   doxycycline 100 MG tablet Commonly known as: VIBRA-TABS Take 1 tablet (100 mg total) by mouth 2 (two) times daily.   dutasteride 0.5 MG capsule Commonly known as: AVODART TAKE 1 CAPSULE BY MOUTH EVERY DAY   fluticasone 50 MCG/ACT nasal spray Commonly known as: FLONASE Place 1 spray into both nostrils daily as needed for allergies or rhinitis. What changed: when to take this   furosemide 20 MG tablet Commonly known  as: LASIX Take 40 mg in AM and 20 mg in afternoon What changed:   how much to take  how to take this  when to take this  additional instructions   gabapentin 300 MG capsule Commonly known as: NEURONTIN TAKE 1 CAPSULE BY MOUTH THREE TIMES A DAY What changed: See the new instructions.   HYDROcodone-acetaminophen 5-325 MG tablet Commonly known as: Norco Take 1 tablet by mouth every 6 (six) hours as needed for moderate pain.   levothyroxine 50 MCG tablet Commonly known as: SYNTHROID Take 1 tablet (50 mcg total) by mouth daily.   linaclotide 145 MCG Caps capsule Commonly known as: Linzess Take 1 capsule (145 mcg total) by mouth daily. To regulate bowel movements   losartan 50 MG tablet Commonly known as: COZAAR Take 1 tablet (50 mg total) by mouth daily.   meloxicam 7.5 MG tablet Commonly known as: MOBIC Take 1 tablet (7.5 mg total) by mouth daily as needed for pain (back pain).   metFORMIN 500 MG 24 hr tablet Commonly known as: GLUCOPHAGE-XR Take 1 tablet (500 mg total) by mouth daily with breakfast.   metoprolol succinate 25 MG 24 hr tablet Commonly known as: TOPROL-XL Take 1.5 tablets (37.5 mg total) by mouth daily. Take with or immediately following a meal. What changed:   when to take this  reasons to take this   ondansetron 4 MG disintegrating tablet Commonly known as: ZOFRAN-ODT TAKE 1 TABLET BY MOUTH EVERY 8 HOURS AS NEEDED FOR NAUSEA AND VOMITING What changed: See the new instructions.   OneTouch Delica Lancets 16X Misc Use to test blood sugars daily as directed. DX: E11.9   OneTouch Verio Reflect w/Device Kit Use to test blood sugars daily as directed. DX: E11.9   OneTouch Verio test strip Generic drug: glucose blood Use to test blood sugars daily as directed. DX: E11.9   polyethylene glycol 17 g packet Commonly known as: MIRALAX / GLYCOLAX Take 17 g by mouth daily as needed for moderate constipation or severe constipation.   pravastatin 40 MG  tablet Commonly known as: PRAVACHOL TAKE 1 TABLET (40 MG TOTAL) BY MOUTH ON MONDAYS , WEDNESDAYS, AND FRIDAYS AS DIRECTED What changed: See the new instructions.   Rybelsus 7 MG Tabs Generic drug: Semaglutide Take 7 mg by mouth daily.   temazepam 7.5  MG capsule Commonly known as: Restoril Take 1 capsule (7.5 mg total) by mouth at bedtime as needed for sleep.   Uro-MP 118 MG Caps TAKE 1 CAPSULE (118 MG TOTAL) BY MOUTH 2 (TWO) TIMES DAILY AS NEEDED. What changed: See the new instructions.            Durable Medical Equipment  (From admission, onward)         Start     Ordered   08/21/20 0722  For home use only DME Shower stool  Once        08/21/20 0721          Discharge Instructions: [facility] Discharge instructions Lower Extremity Bypass Surgery  Please refer to the following instruction for your post-procedure care. Your surgeon or physician assistant will discuss any changes with you.  Activity  You are encouraged to walk as much as you can. You can slowly return to normal activities during the month after your surgery. Avoid strenuous activity and heavy lifting until your doctor tells you it's OK. Avoid activities such as vacuuming or swinging a golf club. Do not drive until your doctor give the OK and you are no longer taking prescription pain medications. It is also normal to have difficulty with sleep habits, eating and bowel movement after surgery. These will go away with time.  Bathing/Showering  You may shower after you go home. Do not soak in a bathtub, hot tub, or swim until the incision heals completely.  Incision Care  Clean your incision with mild soap and water. Shower every day. Pat the area dry with a clean towel. You do not need a bandage unless otherwise instructed. Do not apply any ointments or creams to your incision. If you have open wounds you will be instructed how to care for them or a visiting nurse may be  arranged for you. If you have staples or sutures along your incision they will be removed at your post-op appointment. You may have skin glue on your incision. Do not peel it off. It will come off on its own in about one week.  Wash the groin wound with soap and water daily and pat dry. (No tub bath-only shower)  Then put a dry gauze or washcloth in the groin to keep this area dry to help prevent wound infection.  Do this daily and as needed.  Do not use Vaseline or neosporin on your incisions.  Only use soap and water on your incisions and then protect and keep dry.  Diet  Resume your normal diet. There are no special food restrictions following this procedure. A low fat/ low cholesterol diet is recommended for all patients with vascular disease. In order to heal from your surgery, it is CRITICAL to get adequate nutrition. Your body requires vitamins, minerals, and protein. Vegetables are the best source of vitamins and minerals. Vegetables also provide the perfect balance of protein. Processed food has little nutritional value, so try to avoid this.  Medications  Resume taking all your medications unless your doctor or Physician Assistant tells you not to. If your incision is causing pain, you may take over-the-counter pain relievers such as acetaminophen (Tylenol). If you were prescribed a stronger pain medication, please aware these medication can cause nausea and constipation. Prevent nausea by taking the medication with a snack or meal. Avoid constipation by drinking plenty of fluids and eating foods with high amount of fiber, such as fruits, vegetables, and grains. Take  Colace 100 mg (an over-the-counter stool softener) twice a day as needed for constipation.  Do not take Tylenol if you are taking prescription pain medications.  Follow Up  Our office will schedule a follow up appointment 2-3 weeks following discharge.  Please call us immediately for any of the following conditions  .Severe  or worsening pain in your legs or feet while at rest or while walking .Increase pain, redness, warmth, or drainage (pus) from your incision site(s) . Fever of 101 degree or higher . The swelling in your leg with the bypass suddenly worsens and becomes more painful than when you were in the hospital . If you have been instructed to feel your graft pulse then you should do so every day. If you can no longer feel this pulse, call the office immediately. Not all patients are given this instruction. .  Leg swelling is common after leg bypass surgery.  The swelling should improve over a few months following surgery. To improve the swelling, you may elevate your legs above the level of your heart while you are sitting or resting. Your surgeon or physician assistant may ask you to apply an ACE wrap or wear compression (TED) stockings to help to reduce swelling.  Reduce your risk of vascular disease  Stop smoking. If you would like help call [facility] at 1-800-QUIT-NOW (508) 263-0950) or [place] at 517-738-2385.  . Manage your cholesterol . Maintain a desired weight . Control your diabetes weight . Control your diabetes . Keep your blood pressure down .  If you have any questions, please call the office at [phone]   Prescriptions given: 1.  Norco #20 No Refill 2.  Plavix 74m daily #30 11 refills  Disposition: home  Patient's condition: is Good  Follow up: 1. [facility] in 2-3 weeks   SLeontine Locket PA-C [facility] [id number]/17/2022  7:23 AM  - For VQI Registry use ---   Post-op:  Wound infection: No  Graft infection: No  Transfusion: No    If yes, n/a units given New Arrhythmia: No Ipsilateral amputation: No, _0  Minor, _1  BKA, _2  AKA Discharge patency: [x ] Primary, _3  Primary assisted, _4  Secondary, _5  Occluded Patency judged by: [x ] Dopper only, _6  Palpable graft pulse, _7  Palpable distal pulse, _8  ABI inc. > 0.15, _9  Duplex Discharge  ABI: R not done, L  D/C Ambulatory Status: Ambulatory  Complications: MI: No, <<[id number]>_8<\/[id number]>_75 Troponin only, _11  EKG or Clinical CHF: No Resp failure:No, _12  Pneumonia, _13  Ventilator Chg in renal function: No, _14  Inc. Cr > 0.5, _15  Temp. Dialysis,  _16  Permanent dialysis Stroke: No, _17  Minor, _18  Major Return to OR: No  Reason for return to OR: _19  Bleeding, _20  Infection, _21  Thrombosis, _22  Revision  Discharge medications: Statin use:  yes ASA use:  yes Plavix use:  yes Beta blocker use: yes CCB use:  No ACEI use:   no ARB use:  yes Coumadin use: no

## 2020-08-21 NOTE — Progress Notes (Signed)
[facility] [place]  Subjective  -No complaints.  States he is having minimal pain.  Wants to go home today.   Objective (!) 126/93 90 98 F (36.7 C) (Oral) 18 98%  Intake/Output Summary (Last 24 hours) at 08/21/2020 0654 Last data filed at 08/20/2020 1822 Gross per 24 hour  Intake 2060 ml  Output 50 ml  Net 2010 ml    Right groin incision clean dry and intact Right DP weakly palpable Brisk right DP PT signals Right foot warm  Laboratory Lab Results: Recent Labs    08/20/20 0544 08/21/20 0530  WBC 14.0* 15.4*  HGB 11.1* 10.2*  HCT 38.7* 36.3*  PLT 295 260   BMET Recent Labs    08/20/20 0544  NA 137  K 3.3*  CL 103  CO2 25  GLUCOSE 198*  BUN 19  CREATININE 0.95  CALCIUM 8.9    COAG Lab Results  Component Value Date   INR 1.0 08/14/2020   INR 1.1 05/06/2020   INR 1.1 05/06/2020   No results found for: PTT  Assessment/Planning:  Postop day 1 status post right femoral endarterectomy with retrograde right common iliac artery angioplasty with stent placement and IVUS for right lower extremity short distance lifestyle limiting claudication.  Overall he looks pretty good this morning.  He wants to go home.  Discussed pending how he does with therapy and if he is stable this morning we can plan for discharge this afternoon with follow-up in 2 to 3 weeks.  He will need aspirin and Plavix at discharge which I discussed with him for dual antiplatelet therapy given right iliac stent.  He is already on a statin. PT his cleared him with no follow-up.  States he has already walked down the hall.  States right right leg felt better.  Hgb 11.1 --> 10.2 today.  Marty Heck 08/21/2020 6:54 AM --

## 2020-08-21 NOTE — Progress Notes (Addendum)
Patient ambulating with OT and patient heart rate up to 170 on monitor. Patient returned to room and currently patient heart rate 117 on monitor. Pt states did get slightly short of breath. Pt bp 147/90 oxygen is 92-94% on 2L [place]. Patient call bell with in reach. Will monitor patient. Jeanenne Licea, Bettina Gavia RN   [phone] Leontine Locket [facility] made aware of patient heart rate. EKG obtained will monitor patient. Kincade Granberg, Bettina Gavia RN

## 2020-08-21 NOTE — Consult Note (Signed)
   [facility] Inpatient Consult   08/21/2020  John [name] 1945/08/18 [id number]  [place] [facility] [ACO] Patient: Medicare CMS DCE  Primary Care Provider: [name], FNP, [facility]  Patient is showing extreme high risk score for unplanned readmission risk with 5 hospitalizations in the past 6 months. Patient, also was screened for [place] [facility] services. Patient will have the transition of care call conducted by the primary care provider. This patient is also in an Embedded practice which has a chronic disease management Embedded Care Management team.  Plan: Follow up Embedded Care Management and make a referral to offer Chronic care management, if appropriate.   Please contact for further questions,  Natividad Brood, RN BSN [facility] Liaison  [phone] business mobile phone Toll free office [phone]  Fax number: [phone] [email or web address]

## 2020-08-21 NOTE — Evaluation (Signed)
Occupational Therapy Evaluation Patient Details Name: John [name] MRN: [id number] DOB: [birth year] Today's Date: 08/21/2020    History of Present Illness Pt is a 75 y.o. male with RLE ischemia admitted and s/p R iliac arteriogram, angioplasty, stenting and R femoral endarterectomy on 08/20/20. PMH includes CHF, COPD (O2 PRN), DM, PVD, macular degeneration, OSA, neuropathy, NSCLC, BPH.   Clinical Impression   PTA, pt was living at home with his wife, pt reports he was independent with ADL/IADL and independent with functional mobility with occasional use of spc. Pt currently requires minguard-minA for functional mobility during ADL completion. While washing his hands standing at sink level, pt had 1x moderate loss of balance posteriorly with slow righting reaction time requiring therapist support to correct. Pt with poor insight to safety and deficits stating "I aint going anywhere". Pt demonstrates decreased activity tolerance and cardiopulmonary limitations, during transfer to toilet (~37feet) pt's HR tachy up to 135bpm, after 3 min sitting HR 112bpm. SpO2 <93% on 2lnc throughout session. RN notified of instability and HR. Pt is adamant about returning home. Educated pt on fall prevention strategies and energy conservation strategies for safe engagement in the home environment. Feel pt would benefit from follow-up occupational therapy acutely to address activity tolerance, energy conservation strategies, balance and stability with ADL completion, and safety awareness to allow safe d/c home with wife. No follow-up occupational therapy recommended at this time.     Follow Up Recommendations  No OT follow up;Supervision - Intermittent    Equipment Recommendations  Tub/shower seat    Recommendations for Other Services       Precautions / Restrictions Precautions Precautions: Fall;Other (comment) Precaution Comments: Watch SpO2, HR (tachy up to 170s) Restrictions Weight Bearing Restrictions:  No RLE Weight Bearing: Weight bearing as tolerated      Mobility Bed Mobility Overal bed mobility: Modified Independent Bed Mobility: Supine to Sit     Supine to sit: HOB elevated     General bed mobility comments: Received sitting EOB    Transfers Overall transfer level: Needs assistance Equipment used: None Transfers: Sit to/from Stand Sit to Stand: Min guard         General transfer comment: minguard for stability with initial standing, pt with posterior instability with initial stand    Balance Overall balance assessment: Needs assistance Sitting-balance support: No upper extremity supported;Feet supported Sitting balance-Leahy Scale: Good Sitting balance - Comments: no loss of balance   Standing balance support: Single extremity supported;No upper extremity supported;During functional activity Standing balance-Leahy Scale: Fair Standing balance comment: pt able to static stand without support, while standing at the sink to wash his hands pt with x1 moderate posterior loss of balance requiring assistance from therapist to correct. Pt with decreased insight to deficits stating "I aint going to fall". Pt educated on safety implications with balance deficits and importance of utilizing cane or RW for increased stability with ADL/IADL and functional mobility. RN notified.                           ADL either performed or assessed with clinical judgement   ADL Overall ADL's : Needs assistance/impaired Eating/Feeding: Independent   Grooming: Minimal assistance;Standing Grooming Details (indicate cue type and reason): while standing to wash his hands, pt with 1 moderate posterior loss of balance requiring therapist support to correct and stabilize Upper Body Bathing: Set up;Sitting   Lower Body Bathing: Minimal assistance;Sit to/from stand   Upper Body Dressing :  Set up;Sitting   Lower Body Dressing: Minimal assistance;Sit to/from stand   Toilet Transfer:  Min guard;Ambulation;Regular Glass blower/designer Details (indicate cue type and reason): ambulated to regular commode in bathroom Toileting- Clothing Manipulation and Hygiene: Min guard;Sit to/from stand Toileting - Clothing Manipulation Details (indicate cue type and reason): minguard for stability, pt utilized grab bar     Functional mobility during ADLs: Minimal assistance General ADL Comments: Pt limited by instability, decreased awareness of safety, fall risk and decreased awareness of need for increased assistance     Vision Baseline Vision/History: No visual deficits Patient Visual Report: No change from baseline Vision Assessment?: No apparent visual deficits     Perception     Praxis      Pertinent Vitals/Pain Pain Assessment: 0-10 Pain Score: 2  Pain Location: groin incision Pain Descriptors / Indicators: Pressure Pain Intervention(s): Monitored during session;Limited activity within patient's tolerance     Hand Dominance Right   Extremity/Trunk Assessment Upper Extremity Assessment Upper Extremity Assessment: Overall WFL for tasks assessed   Lower Extremity Assessment Lower Extremity Assessment: Overall WFL for tasks assessed   Cervical / Trunk Assessment Cervical / Trunk Assessment: Normal   Communication Communication Communication: No difficulties   Cognition Arousal/Alertness: Awake/alert Behavior During Therapy: WFL for tasks assessed/performed Overall Cognitive Status: Within Functional Limits for tasks assessed                                 General Comments: decreased safety awareness, pt with moderate posterior loss of balance and slow righting reaction time, requiring assistance from therapist to stabilize, pt stated "I aint going to fall". Pt with decreased insight to medical status and decreased attention to task   General Comments  Pt's HR 80bpm at rest, tachy HR with ambulating ~27feet to the bathroom, HR jumped to 135bpm. RN  notified. required 54min seated rest break to reach 112bpm. SpO2 >93% on 2lnc throughout session. Noted 1x non-sustain VT on monitor following ambulation.    Exercises Exercises: Other exercises Other Exercises Other Exercises: pt educated on energy conservation strategies with provided handout. Other Exercises: pt educated on activity progression and increased self-awareness with ADL/IADL and mobiltiy. Other Exercises: Pt educated on use of pulse ox to monitor SpO2 and HR prior to ADL/IADL and mobility, during, and after. Educated pt on importance of ECS to keep HR <120bpm and SpO2 >92%.   Shoulder Instructions      Home Living Family/patient expects to be discharged to:: Private residence Living Arrangements: Spouse/significant other Available Help at Discharge: Family;Available PRN/intermittently Type of Home: House Home Access: Stairs to enter [employer] of Steps: 2 Entrance Stairs-Rails: Right;Left Home Layout: One level     Bathroom Shower/Tub: Occupational psychologist: Handicapped height Bathroom Accessibility: Yes   Home Equipment: Cane - single point   Additional Comments: wife would like a shower seat for pt.      Prior Functioning/Environment Level of Independence: Independent        Comments: Pt reports independent with ambulation in home and community, occasionally uses SPC when needed, independent with ADLs, driving, denies falls. Pt reports spouse normally dons his socks and he uses supplemental O2 PRN.        OT Problem List: Decreased activity tolerance;Impaired balance (sitting and/or standing);Decreased safety awareness;Decreased knowledge of use of DME or AE;Cardiopulmonary status limiting activity      OT Treatment/Interventions: Self-care/ADL training;Therapeutic exercise;Energy conservation;DME and/or AE  instruction;Therapeutic activities;Patient/family education;Balance training    OT Goals(Current goals can be found in the  care plan section) Acute Rehab OT Goals Patient Stated Goal: to go home today OT Goal Formulation: With patient Time For Goal Achievement: 09/04/20 Potential to Achieve Goals: Good ADL Goals Pt Will Perform Grooming: with modified independence;standing Pt Will Transfer to Toilet: with modified independence;ambulating Additional ADL Goal #1: Pt will demonstrate independence with 3 energy conservation strategies during ADL/IADL and functional mobiltiy. Additional ADL Goal #2: Pt will demonstrate independence with 3 fall prevention strategies for safe engagement in ADL/IADL and functional mobiltiy.  OT Frequency: Min 2X/week   Barriers to D/C:            Co-evaluation              AM-PAC OT "6 Clicks" Daily Activity     Outcome Measure Help from another person eating meals?: None Help from another person taking care of personal grooming?: A Little Help from another person toileting, which includes using toliet, bedpan, or urinal?: A Little Help from another person bathing (including washing, rinsing, drying)?: A Little Help from another person to put on and taking off regular upper body clothing?: A Little Help from another person to put on and taking off regular lower body clothing?: A Little 6 Click Score: 19   End of Session Equipment Utilized During Treatment: Gait belt;Oxygen Nurse Communication: Mobility status  Activity Tolerance: Patient tolerated treatment well Patient left: in bed;with call bell/phone within reach;with nursing/sitter in room  OT Visit Diagnosis: Unsteadiness on feet (R26.81);Other abnormalities of gait and mobility (R26.89);Muscle weakness (generalized) (M62.81)                Time: 1610-9604 OT Time Calculation (min): 29 min Charges:  OT General Charges $OT Visit: 1 Visit OT Evaluation $OT Eval Moderate Complexity: 1 Mod OT Treatments $Self Care/Home Management : 8-22 mins  Helene Kelp OTR/L Acute Rehabilitation Services Office:  [place] 08/21/2020, 10:19 AM

## 2020-08-21 NOTE — Discharge Summary (Incomplete Revision)
Discharge Summary     John [name] 30-Oct-1945 75 y.o. male  [id number]  Admission Date: 08/20/2020  Discharge Date: 08/21/2020  Physician: Marty Heck, MD  Admission Diagnosis: Peripheral arterial disease [facility]) [I73.9]  HPI:   This is a 75 y.o. male with COPD, HTN, HLD, DM, PAD whoinitiallypresentedas a referral from Dr. Gwenlyn Found for evaluation of right femoral endarterectomy. Ultimately patient endorsedseveral years of claudication in the right lower extremity. He got severe burning in the right thigh after only walking about 20 to 25 feet. He recently underwent arteriogram with Dr. Gwenlyn Found on 02/06/2020 that showed a 95% right common iliac stenosis as well as a 90% right common femoral stenosis and a high-grade profunda stenosis and also high grade right mid SFA stenosis   He was scheduled for a right femoral endarterectomy and possible retrograde iliac stent early this monthwith mebut his surgery was canceled after he was admitted with UTI sepsis. He stillhas chronic indwelling Foley catheter nowandis now scheduled for TURP on February 24. He states his right leg is about the same not having any rest pain symptoms or wounds. He would like to get his prostate surgery done first and get his catheter removed prior to getting his surgery with [place].  Hospital Course:  The patient was admitted to the hospital and taken to the operating room on 08/20/2020 and underwent: 1.  Intravascular ultrasound (IVUS) of right external iliac artery, right common iliac artery, and infrarenal aorta 2.  Right iliac arteriogram 3.  Angioplasty and stent of right common iliac artery (8 mm x 29 mm VBX) 4.  Right common femoral endarterectomy with profundoplasty and bovine pericardial patch angioplasty    Findings: After cutdown on the right groin there was really no good pulse proximal to the common femoral lesion.  As a result, I elected to stent the right common iliac artery  high-grade stenosis that appeared greater than 90% by IVUS with an 8 mm x 29 mm VBX.  We then had much better inflow and common femoral endarterectomy was performed including of the proximal SFA and profunda and bovine pericardial patch was sewn.  He had a palpable right common femoral pulse and very brisk dorsalis pedis signal at the foot upon completion.  The pt tolerated the procedure well and was transported to the PACU in good condition.   By POD 1, he was doing well.  He had brisk right DP/PT doppler signals and a weakly palpable right DP pulse.  He had walked.  PT recommending tub seat.  TOC consulted for home needs.  Pt discharged home today.     CBC    Component Value Date/Time   WBC 15.4 (H) 08/21/2020 0530   RBC 4.17 (L) 08/21/2020 0530   HGB 10.2 (L) 08/21/2020 0530   HGB 11.6 (L) 08/14/2020 1549   HCT 36.3 (L) 08/21/2020 0530   HCT 37.3 (L) 08/14/2020 1549   PLT 260 08/21/2020 0530   PLT 424 08/14/2020 1549   MCV 87.1 08/21/2020 0530   MCV 80 08/14/2020 1549   MCH 24.5 (L) 08/21/2020 0530   MCHC 28.1 (L) 08/21/2020 0530   RDW 18.3 (H) 08/21/2020 0530   RDW 16.0 (H) 08/14/2020 1549   LYMPHSABS 0.6 (L) 08/14/2020 1549   MONOABS 0.7 08/13/2020 1402   EOSABS 0.1 08/14/2020 1549   BASOSABS 0.0 08/14/2020 1549    BMET    Component Value Date/Time   NA 139 08/21/2020 0530   NA 142 08/14/2020 1549   K  4.3 08/21/2020 0530   CL 106 08/21/2020 0530   CO2 26 08/21/2020 0530   GLUCOSE 154 (H) 08/21/2020 0530   BUN 17 08/21/2020 0530   BUN 21 08/14/2020 1549   CREATININE 1.14 08/21/2020 0530   CALCIUM 8.6 (L) 08/21/2020 0530   GFRNONAA >60 08/21/2020 0530   GFRAA 102 05/24/2020 1238     Discharge Instructions    Discharge patient   Complete by: As directed    Discharge home once [facility] has arranged for DME equipment   Discharge disposition: 01-Home or Self Care   Discharge patient date: 08/21/2020      Discharge Diagnosis:  Peripheral arterial disease ([place])  [I73.9]  Secondary Diagnosis: Patient Active Problem List   Diagnosis Date Noted  . Peripheral arterial disease ([place]) 08/20/2020  . Dysuria 07/27/2020  . Sepsis due to Escherichia coli (E. coli) ([place]) 05/07/2020  . Hypokalemia 05/06/2020  . Hyperglycemia due to diabetes mellitus ([place]) 05/06/2020  . Hypoalbuminemia 05/06/2020  . Prolonged QT interval 05/06/2020  . Lactic acidosis 05/06/2020  . Bronchitis due to tobacco use 05/06/2020  . Acute respiratory failure with hypoxia ([place]) 05/06/2020  . Acute on chronic respiratory failure with hypoxia ([place]) 05/06/2020  . COPD with acute exacerbation ([place]) 05/06/2020  . Tobacco abuse 05/06/2020  . Urinary retention 03/09/2020  . Acute cystitis with hematuria   . Urinary tract infection with hematuria   . Dyspnea   . Hypoxia   . Sepsis secondary to UTI ([place]) 03/03/2020  . COPD GOLD III/ group D still smoking  10/24/2019  . PAD (peripheral artery disease) ([place]) 09/13/2019  . Obstructive sleep apnea treated with continuous positive airway pressure (CPAP) 05/31/2018  . Diabetic neuropathy ([place]) 12/29/2017  . Cigarette smoker 12/29/2017  . Hyperlipidemia associated with type 2 diabetes mellitus ([place]) 12/29/2017  . Primary insomnia 12/29/2017  . Primary osteoarthritis involving multiple joints 12/29/2017  . OSA (obstructive sleep apnea) 12/10/2017  . Hypothyroidism 06/09/2017  . Joint pain 08/05/2016  . Bilateral hand swelling 08/05/2016  . Hyponatremia 08/05/2016  . Pulmonary nodule, left 07/16/2016  . CHF (congestive heart failure) ([place]) 07/09/2016  . DM (diabetes mellitus) ([place]) 07/09/2016  . Non-small cell lung cancer, right ([place]) 04/18/2016  . Essential hypertension 02/01/2016  . COPD (chronic obstructive pulmonary disease) ([place]) 02/01/2016  . BPH with urinary obstruction 02/01/2016   Past Medical History:  Diagnosis Date  . Adenocarcinoma of lung, right ([place]) 04/18/2016  . Anxiety   . Arthritis   . Asthma   . BPH (benign  prostatic hyperplasia)    with urinary retention 02/06/20  . CHF (congestive heart failure) ([place])   . COPD (chronic obstructive pulmonary disease) ([place])   . Depression   . Diabetes mellitus without complication (HCC)    no meds  . Diabetes mellitus, type II ([place])   . DM (diabetes mellitus) ([place]) 07/09/2016  . Dyspnea   . History of kidney stones   . Hyperlipidemia   . Hypertension   . Hypertension   . Hypothyroidism   . Macular degeneration   . Neuropathy   . Non-small cell lung cancer, right ([place]) 04/18/2016  . Peripheral vascular disease ([place])   . Prostatitis   . Pulmonary nodule, left 07/16/2016  . Sleep apnea    cpap     Allergies as of 08/21/2020      Reactions   Jardiance [empagliflozin] Other (See Comments)   FEELS SLUGGISH, TIRED   Lopressor [metoprolol] Other (See Comments)   Fatigue   Sulfa Antibiotics Swelling  Mouth swelling      Medication List    TAKE these medications   albuterol 108 (90 Base) MCG/ACT inhaler Commonly known as: VENTOLIN HFA Inhale 2 puffs into the lungs every 4 (four) hours as needed for wheezing or shortness of breath.   aspirin EC 81 MG tablet Take 81 mg by mouth daily.   bacitracin 500 UNIT/GM ointment Apply 1 application topically 2 (two) times daily.   cloNIDine 0.1 MG tablet Commonly known as: CATAPRES TAKE 1 TABLET (0.1 MG TOTAL) BY MOUTH 3 (THREE) TIMES DAILY.   clopidogrel 75 MG tablet Commonly known as: PLAVIX Take 1 tablet (75 mg total) by mouth daily at 6 (six) AM. Start taking on: Aug 22, 2020   doxycycline 100 MG tablet Commonly known as: VIBRA-TABS Take 1 tablet (100 mg total) by mouth 2 (two) times daily.   dutasteride 0.5 MG capsule Commonly known as: AVODART TAKE 1 CAPSULE BY MOUTH EVERY DAY   fluticasone 50 MCG/ACT nasal spray Commonly known as: FLONASE Place 1 spray into both nostrils daily as needed for allergies or rhinitis. What changed: when to take this   furosemide 20 MG tablet Commonly known  as: LASIX Take 40 mg in AM and 20 mg in afternoon What changed:   how much to take  how to take this  when to take this  additional instructions   gabapentin 300 MG capsule Commonly known as: NEURONTIN TAKE 1 CAPSULE BY MOUTH THREE TIMES A DAY What changed: See the new instructions.   HYDROcodone-acetaminophen 5-325 MG tablet Commonly known as: Norco Take 1 tablet by mouth every 6 (six) hours as needed for moderate pain.   levothyroxine 50 MCG tablet Commonly known as: SYNTHROID Take 1 tablet (50 mcg total) by mouth daily.   linaclotide 145 MCG Caps capsule Commonly known as: Linzess Take 1 capsule (145 mcg total) by mouth daily. To regulate bowel movements   losartan 50 MG tablet Commonly known as: COZAAR Take 1 tablet (50 mg total) by mouth daily.   meloxicam 7.5 MG tablet Commonly known as: MOBIC Take 1 tablet (7.5 mg total) by mouth daily as needed for pain (back pain).   metFORMIN 500 MG 24 hr tablet Commonly known as: GLUCOPHAGE-XR Take 1 tablet (500 mg total) by mouth daily with breakfast.   metoprolol succinate 25 MG 24 hr tablet Commonly known as: TOPROL-XL Take 1.5 tablets (37.5 mg total) by mouth daily. Take with or immediately following a meal. What changed:   when to take this  reasons to take this   ondansetron 4 MG disintegrating tablet Commonly known as: ZOFRAN-ODT TAKE 1 TABLET BY MOUTH EVERY 8 HOURS AS NEEDED FOR NAUSEA AND VOMITING What changed: See the new instructions.   OneTouch Delica Lancets 30G Misc Use to test blood sugars daily as directed. DX: E11.9   OneTouch Verio Reflect w/Device Kit Use to test blood sugars daily as directed. DX: E11.9   OneTouch Verio test strip Generic drug: glucose blood Use to test blood sugars daily as directed. DX: E11.9   polyethylene glycol 17 g packet Commonly known as: MIRALAX / GLYCOLAX Take 17 g by mouth daily as needed for moderate constipation or severe constipation.   pravastatin 40 MG  tablet Commonly known as: PRAVACHOL TAKE 1 TABLET (40 MG TOTAL) BY MOUTH ON MONDAYS , WEDNESDAYS, AND FRIDAYS AS DIRECTED What changed: See the new instructions.   Rybelsus 7 MG Tabs Generic drug: Semaglutide Take 7 mg by mouth daily.   temazepam 7.5   MG capsule Commonly known as: Restoril Take 1 capsule (7.5 mg total) by mouth at bedtime as needed for sleep.   Uro-MP 118 MG Caps TAKE 1 CAPSULE (118 MG TOTAL) BY MOUTH 2 (TWO) TIMES DAILY AS NEEDED. What changed: See the new instructions.            Durable Medical Equipment  (From admission, onward)         Start     Ordered   08/21/20 0722  For home use only DME Shower stool  Once        08/21/20 0721          Discharge Instructions: [facility] [place] Discharge instructions Lower Extremity Bypass Surgery  Please refer to the following instruction for your post-procedure care. Your surgeon or physician assistant will discuss any changes with you.  Activity  You are encouraged to walk as much as you can. You can slowly return to normal activities during the month after your surgery. Avoid strenuous activity and heavy lifting until your doctor tells you it's OK. Avoid activities such as vacuuming or swinging a golf club. Do not drive until your doctor give the OK and you are no longer taking prescription pain medications. It is also normal to have difficulty with sleep habits, eating and bowel movement after surgery. These will go away with time.  Bathing/Showering  You may shower after you go home. Do not soak in a bathtub, hot tub, or swim until the incision heals completely.  Incision Care  Clean your incision with mild soap and water. Shower every day. Pat the area dry with a clean towel. You do not need a bandage unless otherwise instructed. Do not apply any ointments or creams to your incision. If you have open wounds you will be instructed how to care for them or a visiting nurse may be  arranged for you. If you have staples or sutures along your incision they will be removed at your post-op appointment. You may have skin glue on your incision. Do not peel it off. It will come off on its own in about one week.  Wash the groin wound with soap and water daily and pat dry. (No tub bath-only shower)  Then put a dry gauze or washcloth in the groin to keep this area dry to help prevent wound infection.  Do this daily and as needed.  Do not use Vaseline or neosporin on your incisions.  Only use soap and water on your incisions and then protect and keep dry.  Diet  Resume your normal diet. There are no special food restrictions following this procedure. A low fat/ low cholesterol diet is recommended for all patients with vascular disease. In order to heal from your surgery, it is CRITICAL to get adequate nutrition. Your body requires vitamins, minerals, and protein. Vegetables are the best source of vitamins and minerals. Vegetables also provide the perfect balance of protein. Processed food has little nutritional value, so try to avoid this.  Medications  Resume taking all your medications unless your doctor or Physician Assistant tells you not to. If your incision is causing pain, you may take over-the-counter pain relievers such as acetaminophen (Tylenol). If you were prescribed a stronger pain medication, please aware these medication can cause nausea and constipation. Prevent nausea by taking the medication with a snack or meal. Avoid constipation by drinking plenty of fluids and eating foods with high amount of fiber, such as fruits, vegetables, and grains. Take   Colace 100 mg (an over-the-counter stool softener) twice a day as needed for constipation.  Do not take Tylenol if you are taking prescription pain medications.  Follow Up  Our office will schedule a follow up appointment 2-3 weeks following discharge.  Please call us immediately for any of the following conditions  .Severe  or worsening pain in your legs or feet while at rest or while walking .Increase pain, redness, warmth, or drainage (pus) from your incision site(s) . Fever of 101 degree or higher . The swelling in your leg with the bypass suddenly worsens and becomes more painful than when you were in the hospital . If you have been instructed to feel your graft pulse then you should do so every day. If you can no longer feel this pulse, call the office immediately. Not all patients are given this instruction. .  Leg swelling is common after leg bypass surgery.  The swelling should improve over a few months following surgery. To improve the swelling, you may elevate your legs above the level of your heart while you are sitting or resting. Your surgeon or physician assistant may ask you to apply an ACE wrap or wear compression (TED) stockings to help to reduce swelling.  Reduce your risk of vascular disease  Stop smoking. If you would like help call [facility] at 1-800-QUIT-NOW (508) 263-0950) or [place] at [phone].  . Manage your cholesterol . Maintain a desired weight . Control your diabetes weight . Control your diabetes . Keep your blood pressure down .  If you have any questions, please call the office at [phone]   Prescriptions given: 1.  Norco #20 No Refill 2.  Plavix 74m daily #30 11 refills  Disposition: home  Patient's condition: is Good  Follow up: 1. [facility] in 2-3 weeks   SLeontine Locket PA-C [facility] [id number]/17/2022  7:23 AM  - For VQI Registry use ---   Post-op:  Wound infection: No  Graft infection: No  Transfusion: No    If yes, n/a units given New Arrhythmia: No Ipsilateral amputation: No, _0  Minor, _1  BKA, _2  AKA Discharge patency: [x ] Primary, _3  Primary assisted, _4  Secondary, _5  Occluded Patency judged by: [x ] Dopper only, _6  Palpable graft pulse, _7  Palpable distal pulse, _8  ABI inc. > 0.15, _9  Duplex Discharge  ABI: R not done, L  D/C Ambulatory Status: Ambulatory  Complications: MI: No, <<[id number]>_8<\/[id number]>_75 Troponin only, _11  EKG or Clinical CHF: No Resp failure:No, _12  Pneumonia, _13  Ventilator Chg in renal function: No, _14  Inc. Cr > 0.5, _15  Temp. Dialysis,  _16  Permanent dialysis Stroke: No, _17  Minor, _18  Major Return to OR: No  Reason for return to OR: _19  Bleeding, _20  Infection, _21  Thrombosis, _22  Revision  Discharge medications: Statin use:  yes ASA use:  yes Plavix use:  yes Beta blocker use: yes CCB use:  No ACEI use:   no ARB use:  yes Coumadin use: no

## 2020-08-21 NOTE — TOC Transition Note (Signed)
Transition of Care (TOC) - CM/SW Discharge Note Marvetta Gibbons RN, BSN Transitions of Care Unit 4E- RN Case Manager See Treatment Team for direct phone #    Patient Details  Name: John [name] MRN: [id number] Date of Birth: 04-23-1945  Transition of Care [facility]) CM/SW Contact:  Dawayne Patricia, RN Phone Number: 08/21/2020, 11:10 AM   Clinical Narrative:    Pt stable for transition home today, order has been placed for DME- shower chair, pt also requesting an 02 tank to transport home with.  Pt is active with [employer] for home 02 needs.   Call made to [facility] with [employer] for DME needs- shower chair to be delivered to room prior to discharge- they will also bring a portable 02 tank for pt to transport home with.    Final next level of care: Home/Self Care Barriers to Discharge: No Barriers Identified   Patient Goals and CMS Choice   Pt active with [employer] for home 02 needs   Choice offered to / list presented to : NA  Discharge Placement                 Home      Discharge Plan and Services     Post Acute Care Choice: Durable Medical Equipment          DME Arranged: Shower stool DME Agency: [employer] Date DME Agency Contacted: 08/21/20 Time DME Agency Contacted: 1000 Representative spoke with at DME Agency: Freda Munro HH Arranged: NA [place] Agency: NA        Social Determinants of Health ([place]) Interventions     Readmission Risk Interventions Readmission Risk Prevention Plan 08/21/2020 06/04/2020 05/08/2020  Transportation Screening Complete Complete Complete  PCP or Specialist Appt within 3-5 Days Complete - -  Home Care Screening - - Complete  Medication Review (RN CM) - - Complete  HRI or Home Care Consult Complete Complete -  Social Work Consult for [employer] Planning/Counseling Complete Complete -  Palliative Care Screening Not Applicable Not Applicable -  Medication Review Press photographer) Complete Complete -  Some recent data might be hidden

## 2020-08-21 NOTE — Progress Notes (Signed)
  Echocardiogram 2D Echocardiogram has been performed.  John [name] 08/21/2020, 5:29 PM

## 2020-08-21 NOTE — Consult Note (Addendum)
Cardiology Consultation:   Patient ID: John [name] MRN: [id number]; DOB: [birth year]  Admit date: 08/20/2020 Date of Consult: 08/21/2020  PCP:  [name], FNP   [facility] Providers Cardiologist:  [name], MD (Inactive) and Dr Gwenlyn Found for PAD   Patient Profile:   John [name] is a 75 y.o. male with a hx of CAD (by coronary calcification on CT), PAD, type 2 DM, HTN, HLD, right NSCLC s/p chemoradiation with recurrence, COPD on PRN oxygen, OSA on CPAP, active tobacco abuse, who is being seen 08/21/2020 for the evaluation of atrial flutter at the request of Dr Monica Martinez.    History of Present Illness:   John [name] has originally followed with Dr Bronson Ing since 06/2017 for chest pain with dyspnea. 04/2016 Lexiscan Myoview stress test was low risk with small area of inferior ischemia. Echo 12/2016 with EF 55-60%, no RWMA, trivial pericardial effusion. He was medically managed with ASA, BB, statin.  12/2017 he was started on lasix by PCP due to concern of CHF. Echo repeated 01/2018 normal left ventricular systolic function, LVEF 60 to 65%, mild aortic annular calcifications and moderately calcified leaflets, moderate mitral annular calcification, and possible small anteroapical pericardial effusion with signs of chronicity. He was maintained on lasix  With good effect.   Nuclear stress test repeated on 03/12/2018 showed an inferior defect extending from the apex to the base suggestive of scar versus soft tissue attenuation. There were no ischemic territories.  Chest CT on 03/15/2018 demonstrated coronary artery calcifications as well as aortic and mitral annular calcifications.  He was last seen by Dr Ward Chatters on 11/10/2018, felt symptoms are stable, he was reportedly not tolerating metoprolol or imdur, cardiac catheterization can be consider if symptoms are worsened.   He started seeing Ms. Strader (APP on 09/11/19) for follow up, had complained right leg claudication.  He had a lower extremity doppler showing 75-99% SFA stenosis, therefore he was referred to Dr. Gwenlyn Found. He underwent peripheral angiography by Dr. Gwenlyn Found on 02/06/2020 for right lower extremity claudication.  He noted to have a 95% calcified right common iliac artery, 90% right common femoral artery as well as profunda femoris and a 95% mid right SFA. He was referred to Dr Fortunato Curling for right common femoral endarterectomy, with future plan for orbital atherectomy and stenting of his right common iliac artery with staged right SFA intervention after the endarterectomy.   He was last seen in the office by APP on 05/24/20 for pre-op cardiac evaluation for TURP (for BPH and urinary obstruction) and routine follow up. CAD and PAD symptoms were stable, he was continued on medical therapy with ASA, metoprolol, statin. He has sinus tachycardia for years with HR 100s, metoprolol dosing was reduced to 37.67m from 514mdue to feeling "off". He was on lasix 4075mM and 45m30m for CHF.   Patient presented [facility] for scheduled vascular surgery. He underwent right iliac arteriogram, angioplasty and stent of right common iliac artery, right common femoral endarterectomy with profundoplasty and bovine pericardial patch angioplasty by Dr ClarCarlis Abbott was started on ASA and Plavix and continued on statin post procedure. He was planned for discharge today, during PT session, he was noted with significant SOB and tachycardia with HR up to 170s with ambulation. EKG obtained showing atrial flutter with 4:1 AV conduction, ventricular rate of 87 bpm.  Cardiology is consulted for management of A flutter.   During encounter, patient states he is feeling well.  He  denies any chest pain, heart palpitation, dizziness, syncope during PT session earlier.  He is asking if he can go home.  He denied a history of atrial fibrillation or atrial flutter in the past.  He has been compliant with his medication at home.  He denied oliguria,  leg edema, orthopnea.  He states he has COPD, uses oxygen as needed at home, oxygen level has not been bad.  He reports recently found possible recurrence of his lung cancer and his oncologist recommended radiation.    Past Medical History:  Diagnosis Date  . Adenocarcinoma of lung, right ([place]) 04/18/2016  . Anxiety   . Arthritis   . Asthma   . BPH (benign prostatic hyperplasia)    with urinary retention 02/06/20  . CHF (congestive heart failure) ([place])   . COPD (chronic obstructive pulmonary disease) ([place])   . Depression   . Diabetes mellitus without complication (HCC)    no meds  . Diabetes mellitus, type II ([place])   . DM (diabetes mellitus) ([place]) 07/09/2016  . Dyspnea   . History of kidney stones   . Hyperlipidemia   . Hypertension   . Hypertension   . Hypothyroidism   . Macular degeneration   . Neuropathy   . Non-small cell lung cancer, right ([place]) 04/18/2016  . Peripheral vascular disease ([place])   . Prostatitis   . Pulmonary nodule, left 07/16/2016  . Sleep apnea    cpap    Past Surgical History:  Procedure Laterality Date  . ABDOMINAL AORTOGRAM W/LOWER EXTREMITY Left 02/06/2020   Procedure: ABDOMINAL AORTOGRAM W/LOWER EXTREMITY;  Surgeon: Lorretta Harp, MD;  Location: [place] [facility];  Service: Cardiovascular;  Laterality: Left;  . CATARACT EXTRACTION, BILATERAL Bilateral   . ENDARTERECTOMY FEMORAL Right 08/20/2020   Procedure: ENDARTERECTOMY  RIGHT FEMORAL ARTERY;  Surgeon: Marty Heck, MD;  Location: [place];  Service: Vascular;  Laterality: Right;  . INSERTION OF ILIAC STENT Right 08/20/2020   Procedure: RETROGRADE INSERTION OF RIGHT ILIAC STENT;  Surgeon: Marty Heck, MD;  Location: [place];  Service: Vascular;  Laterality: Right;  . INTRAOPERATIVE ARTERIOGRAM Right 08/20/2020   Procedure: INTRA OPERATIVE ARTERIOGRAM ILIAC;  Surgeon: Marty Heck, MD;  Location: [place];  Service: Vascular;  Laterality: Right;  . PATCH ANGIOPLASTY Right 08/20/2020    Procedure: PATCH ANGIOPLASTY RIGHT FEMORAL ARTERY;  Surgeon: Marty Heck, MD;  Location: [place];  Service: Vascular;  Laterality: Right;  . PORTACATH PLACEMENT Left 06/13/2016   Procedure: INSERTION PORT-A-CATH;  Surgeon: Aviva Signs, MD;  Location: [facility];  Service: General;  Laterality: Left;  . TRANSURETHRAL RESECTION OF PROSTATE N/A 05/31/2020   Procedure: TRANSURETHRAL RESECTION OF THE PROSTATE (TURP);  Surgeon: Cleon Gustin, MD;  Location: [facility];  Service: Urology;  Laterality: N/A;  . VIDEO BRONCHOSCOPY WITH ENDOBRONCHIAL NAVIGATION N/A 05/28/2016   Procedure: VIDEO BRONCHOSCOPY WITH ENDOBRONCHIAL NAVIGATION;  Surgeon: [name], MD;  Location: [place];  Service: Thoracic;  Laterality: N/A;  . VIDEO BRONCHOSCOPY WITH ENDOBRONCHIAL ULTRASOUND N/A 05/28/2016   Procedure: VIDEO BRONCHOSCOPY WITH ENDOBRONCHIAL ULTRASOUND;  Surgeon: [name], MD;  Location: [place];  Service: Thoracic;  Laterality: N/A;     Home Medications:  Prior to Admission medications   Medication Sig Start Date End Date Taking? Authorizing Provider  albuterol (VENTOLIN HFA) 108 (90 Base) MCG/ACT inhaler Inhale 2 puffs into the lungs every 4 (four) hours as needed for wheezing or shortness of breath. 08/08/20  Yes Hawks, [place]shear A,  FNP  aspirin EC 81 MG tablet Take 81 mg by mouth daily.   Yes [provider]  cloNIDine (CATAPRES) 0.1 MG tablet TAKE 1 TABLET (0.1 MG TOTAL) BY MOUTH 3 (THREE) TIMES DAILY. 06/14/20  Yes Hawks, Christy A, FNP  doxycycline (VIBRA-TABS) 100 MG tablet Take 1 tablet (100 mg total) by mouth 2 (two) times daily. 08/08/20  Yes Hawks, Christy A, FNP  fluticasone (FLONASE) 50 MCG/ACT nasal spray Place 1 spray into both nostrils daily as needed for allergies or rhinitis. Patient taking differently: Place 1 spray into both nostrils daily. 08/03/20  Yes Hawks, Christy A, FNP  furosemide (LASIX) 20 MG tablet Take 40 mg in AM and 20 mg in afternoon Patient taking  differently: Take 20-40 mg by mouth See admin instructions. Take 40 mg in AM and 20 mg in evening 08/10/20  Yes Hawks, Christy A, FNP  gabapentin (NEURONTIN) 300 MG capsule TAKE 1 CAPSULE BY MOUTH THREE TIMES A DAY Patient taking differently: Take 300 mg by mouth 3 (three) times daily. 08/13/20  Yes Hawks, Christy A, FNP  HYDROcodone-acetaminophen (NORCO) 5-325 MG tablet Take 1 tablet by mouth every 6 (six) hours as needed for moderate pain. 08/21/20  Yes Rhyne, Hulen Shouts, PA-C  levothyroxine (SYNTHROID) 50 MCG tablet Take 1 tablet (50 mcg total) by mouth daily. 06/05/20 07/05/20 Yes Shah, Pratik D, DO  linaclotide (LINZESS) 145 MCG CAPS capsule Take 1 capsule (145 mcg total) by mouth daily. To regulate bowel movements 08/14/20  Yes Stacks, Cletus Gash, MD  losartan (COZAAR) 50 MG tablet Take 1 tablet (50 mg total) by mouth daily. 08/03/20  Yes Hawks, Christy A, FNP  meloxicam (MOBIC) 7.5 MG tablet Take 1 tablet (7.5 mg total) by mouth daily as needed for pain (back pain). 03/20/20  Yes Hawks, Christy A, FNP  metFORMIN (GLUCOPHAGE-XR) 500 MG 24 hr tablet Take 1 tablet (500 mg total) by mouth daily with breakfast. 03/20/20  Yes Hawks, Christy A, FNP  Meth-Hyo-M Barnett Hatter Phos-Ph Sal (URO-MP) 118 MG CAPS TAKE 1 CAPSULE (118 MG TOTAL) BY MOUTH 2 (TWO) TIMES DAILY AS NEEDED. Patient taking differently: Take 118 mg by mouth daily as needed (Uninary burning). 07/31/20  Yes McKenzie, Candee Furbish, MD  metoprolol succinate (TOPROL-XL) 25 MG 24 hr tablet Take 1.5 tablets (37.5 mg total) by mouth daily. Take with or immediately following a meal. Patient taking differently: Take 37.5 mg by mouth daily as needed (pulse rate). Take with or immediately following a meal. 05/24/20 05/19/21 Yes Strader, [place] M, PA-C  ondansetron (ZOFRAN-ODT) 4 MG disintegrating tablet TAKE 1 TABLET BY MOUTH EVERY 8 HOURS AS NEEDED FOR NAUSEA AND VOMITING Patient taking differently: Take 4 mg by mouth every 8 (eight) hours as needed for vomiting or nausea.  07/24/20  Yes Hawks, Christy A, FNP  pravastatin (PRAVACHOL) 40 MG tablet TAKE 1 TABLET (40 MG TOTAL) BY MOUTH ON MONDAYS , WEDNESDAYS, AND FRIDAYS AS DIRECTED Patient taking differently: Take 40 mg by mouth every Monday, Wednesday, and Friday. 05/11/20  Yes Hawks, Christy A, FNP  Semaglutide (RYBELSUS) 7 MG TABS Take 7 mg by mouth daily. 03/20/20  Yes Hawks, Christy A, FNP  temazepam (RESTORIL) 7.5 MG capsule Take 1 capsule (7.5 mg total) by mouth at bedtime as needed for sleep. 07/27/20  Yes Hawks, Christy A, FNP  bacitracin 500 UNIT/GM ointment Apply 1 application topically 2 (two) times daily. 06/01/20   McKenzie, Candee Furbish, MD  Blood Glucose Monitoring Suppl (ONETOUCH VERIO REFLECT) w/Device KIT Use to test blood  sugars daily as directed. DX: E11.9 10/14/19   Evelina Dun A, FNP  clopidogrel (PLAVIX) 75 MG tablet Take 1 tablet (75 mg total) by mouth daily at 6 (six) AM. 08/22/20   Rhyne, Hulen Shouts, PA-C  dutasteride (AVODART) 0.5 MG capsule TAKE 1 CAPSULE BY MOUTH EVERY DAY 03/26/20   Hawks, Christy A, FNP  glucose blood (ONETOUCH VERIO) test strip Use to test blood sugars daily as directed. DX: E11.9 10/14/19   [name], FNP  OneTouch Delica Lancets 19J MISC Use to test blood sugars daily as directed. DX: E11.9 10/14/19   Evelina Dun A, FNP  polyethylene glycol (MIRALAX / GLYCOLAX) 17 g packet Take 17 g by mouth daily as needed for moderate constipation or severe constipation. 06/05/20   Heath Lark D, DO    Inpatient Medications: Scheduled Meds: . apixaban  5 mg Oral BID  . cloNIDine  0.1 mg Oral TID  . clopidogrel  75 mg Oral Q0600  . docusate sodium  100 mg Oral Daily  . dutasteride  0.5 mg Oral Daily  . ezetimibe  10 mg Oral Daily  . furosemide  40 mg Oral Daily   And  . furosemide  20 mg Oral QPM  . gabapentin  300 mg Oral TID  . insulin aspart  0-15 Units Subcutaneous TID WC  . levothyroxine  50 mcg Oral Daily  . linaclotide  145 mcg Oral QAC breakfast  . losartan  50 mg  Oral Daily  . pantoprazole  40 mg Oral Daily  . pravastatin  40 mg Oral Q M,W,F   Continuous Infusions: . sodium chloride    . sodium chloride Stopped (08/20/20 1630)  . magnesium sulfate bolus IVPB     PRN Meds: sodium chloride, acetaminophen **OR** acetaminophen, albuterol, alum & mag hydroxide-simeth, bisacodyl, fluticasone, guaiFENesin-dextromethorphan, hydrALAZINE, HYDROmorphone (DILAUDID) injection, labetalol, magnesium sulfate bolus IVPB, metoprolol succinate, oxyCODONE-acetaminophen, phenol, senna-docusate, sodium phosphate, temazepam  Allergies:    Allergies  Allergen Reactions  . Jardiance [Empagliflozin] Other (See Comments)    FEELS SLUGGISH, TIRED  . Lopressor [Metoprolol] Other (See Comments)    Fatigue  . Atorvastatin     Muscle aches - tolerating Pravastatin 40 mg MWF  . Rosuvastatin     Muscle aches - tolerating Pravastatin 40 mg MWF  . Sulfa Antibiotics Swelling    Mouth swelling    Social History:   Social History   Socioeconomic History  . Marital status: Divorced    Spouse name: Not on file  . Number of children: 5  . Years of education: 10  . Highest education level: 10th grade  Occupational History  . Occupation: Retired  Tobacco Use  . Smoking status: Current Every Day Smoker    Packs/day: 0.50    Years: 55.00    Pack years: 27.50    Types: Cigarettes    Start date: 03/11/1961  . Smokeless tobacco: Never Used  . Tobacco comment: 1/2 pack to 1 pack per day 12/14/19  Vaping Use  . Vaping Use: Never used  Substance and Sexual Activity  . Alcohol use: No  . Drug use: No  . Sexual activity: Yes    Birth control/protection: None  Other Topics Concern  . Not on file  Social History Narrative  . Not on file   Social Determinants of Health   Financial Resource Strain: Not on file  Food Insecurity: Not on file  Transportation Needs: Not on file  Physical Activity: Not on file  Stress: Not on file  Social Connections: Not on file  Intimate  Partner Violence: Not on file    Family History:    Family History  Problem Relation Age of Onset  . Hypertension Mother   . Diabetes Father   . Heart disease Father   . Stroke Father   . Hypertension Sister      ROS:  Constitutional: Denied fever, chills, malaise, night sweats Eyes: Denied vision change or loss Ears/Nose/Mouth/Throat: Denied ear ache, sore throat, sinus pain Cardiovascular: See HPI Respiratory: Chronic shortness of breath and cough Gastrointestinal: Denied nausea, vomiting, abdominal pain, diarrhea Genital/Urinary: Denied dysuria, hematuria, urinary frequency/urgency Musculoskeletal: Denied muscle ache, joint pain, weakness Skin: Denied rash, wound Neuro: Denied headache, dizziness, syncope Psych: Denied history of depression/anxiety  Endocrine: history of diabetes   Physical Exam/Data:   Vitals:   08/21/20 0746 08/21/20 0824 08/21/20 1100 08/21/20 1604  BP: 104/85 (!) 147/90 (!) 108/53 118/61  Pulse: 90  87 87  Resp: _0 (!) 0  Temp: 98.7 F (37.1 C)  98.7 F (37.1 C) 98.2 F (36.8 C)  TempSrc: Oral  Oral Oral  SpO2: 100% 94% 99% 100%  Weight:      Height:        Intake/Output Summary (Last 24 hours) at 08/21/2020 1628 Last data filed at 08/21/2020 1200 Gross per 24 hour  Intake 760 ml  Output --  Net 760 ml   Last 3 Weights 08/20/2020 08/15/2020 08/14/2020  Weight (lbs) 164 lb 9.6 oz 163 lb 11.2 oz 161 lb 3.2 oz  Weight (kg) 74.662 kg 74.254 kg 73.12 kg     Body mass index is 26.57 kg/m.   Vitals:  Vitals:   08/21/20 1100 08/21/20 1604  BP: (!) 108/53 118/61  Pulse: 87 87  Resp: 18 (!) 0  Temp: 98.7 F (37.1 C) 98.2 F (36.8 C)  SpO2: 99% 100%   General Appearance: In no apparent distress, laying in bed HEENT: Normocephalic, atraumatic. EOMs intact. Sclera anicteric.  Neck: Supple, trachea midline, no JVDs Cardiovascular: Regular rhythm, normal S1-S2,  no murmur/rub/gallop Respiratory: Resting breathing unlabored, lungs  sounds with scattered wheezing to auscultation bilaterally, no use of accessory muscles. On 2 L nasal cannula oxygen.  Speak full sentences Gastrointestinal: Bowel sounds positive, abdomen soft, non-tender, non-distended.  Extremities: Able to move all extremities in bed without difficulty, right groin incision intact , minor ecchymosis noted, no large area of hematoma or bleeding , right foot dorsalis pedis +, no neurovascular abnormality genitourinary:  genital exam not performed Musculoskeletal: Normal muscle bulk and tone, global weakness  skin: Intact, warm, dry.  Neurologic: Alert, oriented to person, place and time. Fluent speech,  no cognitive deficit, no gross focal neuro deficit Psychiatric: Normal affect. Mood is appropriate.    EKG:  The EKG was personally reviewed and demonstrates:  Atrial flutter with ventricular rate of 87 bpm, 4 to 1 AV conduction.   Telemetry:  Telemetry was personally reviewed and demonstrates: Atrial flutter with intermittent 4 to1 AV conduction and variable AV conduction, currently rate of 80s, 115s at noon    Relevant CV Studies:  Echo on 01/15/2018:   - Left ventricle: The cavity size was normal. Wall thickness was  normal. Systolic function was normal. The estimated ejection  fraction was in the range of 60% to 65%. Although no diagnostic  regional wall motion abnormality was identified, this possibility  cannot be completely excluded on the basis of this study.  Indeterminate diastolic function.  - Aortic valve: Mildly calcified  annulus. Probably trileaflet;  moderately calcified leaflets.  - Mitral valve: Moderately calcified annulus. There was trivial  regurgitation.  - Right ventricle: Systolic function was mildly reduced.  - Right atrium: Central venous pressure (est): 8 mm Hg.  - Tricuspid valve: There was trivial regurgitation.  - Pulmonary arteries: Systolic pressure could not be accurately  estimated.  - Pericardium,  extracardiac: A prominent pericardial fat pad was  present. Cannot exclude small anteroapical pericardial effusion  with signs of chronicity.    Nuclear stress test repeated on 03/12/2018:  showed an inferior defect extending from the apex to the base suggestive of scar versus soft tissue attenuation. There were no ischemic territories.     Chest CT on 03/15/2018: demonstrated coronary artery calcifications as well as aortic and mitral annular calcifications.   Laboratory Data:  High Sensitivity Troponin:  No results for input(s): TROPONINIHS in the last 720 hours.   Chemistry Recent Labs  Lab 08/20/20 0544 08/21/20 0530  NA 137 139  K 3.3* 4.3  CL 103 106  CO2 25 26  GLUCOSE 198* 154*  BUN 19 17  CREATININE 0.95 1.14  CALCIUM 8.9 8.6*  GFRNONAA >60 >60  ANIONGAP 9 7    No results for input(s): PROT, ALBUMIN, AST, ALT, ALKPHOS, BILITOT in the last 168 hours. Hematology Recent Labs  Lab 08/20/20 0544 08/21/20 0530  WBC 14.0* 15.4*  RBC 4.50 4.17*  HGB 11.1* 10.2*  HCT 38.7* 36.3*  MCV 86.0 87.1  MCH 24.7* 24.5*  MCHC 28.7* 28.1*  RDW 17.6* 18.3*  PLT 295 260   BNPNo results for input(s): BNP, PROBNP in the last 168 hours.  DDimer No results for input(s): DDIMER in the last 168 hours.   Radiology/Studies:  HYBRID OR IMAGING ([facility] ONLY)  Result Date: 08/20/2020 There is no interpretation for this exam.  This order is for images obtained during a surgical procedure.  Please See "Surgeries" Tab for more information regarding the procedure.     Assessment and Plan:   New onset of atrial flutter 4:1 conduction - rate is controlled, continue BBlocker at current dose  - clinically symptomatic and hemodynamically stable - unknown onset /duration (no pre-op EKG)  - CHA2DS2VASc score is 5 due to age, HTN, CHF, PAD/CAD, DM, recommend anticoagulation with DOAC, discussed with vascular surgery team Ms. Rhyne (APP), ok to start Eliquis, will talk to Dr Carlis Abbott to  determine if DAPT (ASA and Plavix) is necessary as bleeding risk will be high - will repeat Echo today, added Mag to AM labs, TSH WNL 08/13/20  - will plan for D[facility]V outpatient after 3 weeks of uninterrupted anticoagulation , will arrange appointment with Dr Audie Box in 2-3 weeks   CAD per non-coronary CT chest - denied chest pain at this time  -Nuclear stress test on 03/12/2018 without ischemia - medically managed with ASA, statin, BBlocker historically, will continue - LDL 80 on 08/21/20, will add Zetia for optimal lipid lowering therapy   Chronic diastolic heart failure  - EF preserved on Echo from 01/2018 - weight 74.7 kg currently, was 77.5kg on 2/22 office visit  - on lasix 44m AM and 230mPM at home - clinically compensated - continue home dose diuretic   PAD with right claudication - s/p right iliac arteriogram, angioplasty and stent of right common iliac artery, right common femoral endarterectomy with profundoplasty and bovine pericardial patch angioplasty by Dr ClCarlis Abbott/16/22 - follow up with vascular surgery outpatient - on DAPT and statin   Hypertension -  Blood pressure is controlled -Continue home regimen including losartan, lasix, metoprolol, clonidine   Type 2 DM  - A1C 7.5% 08/20/20, worsening comparing to previous lab - on semaglutide and metformin at home  - Recommending holding metformin given recent contrast use for 48 hours, discontinued the order  - follow up with PCP for adjustment   Hypothyroidism   - continue levothyroxine  - TSH WNL 08/13/20  COPD - no evidence of exacerbation,  continue home bronchodilator  OSA - resume CPAP at night, he did not wear CPAP last night, discussed importance of compliance   Right NSCLC -Completed chemoradiation in 2018 -Recent PET scan on 08/13/2020 revealed further progression of bilateral upper lobe hypermetabolic pulmonary nodule concerning for metastatic disease versus metachronous primaries -He is planned for radiation  per oncology  Tobacco abuse -Discussed with patient regarding absolute necessity of smoking cessation     Risk Assessment/Risk Scores:    New York Heart Association (NYHA) Functional Class NYHA Class II  CHA2DS2-VASc Score = 5  This indicates a 7.2% annual risk of stroke. The patient's score is based upon: CHF History: Yes HTN History: Yes Diabetes History: Yes Stroke History: No Vascular Disease History: Yes Age Score: 1 Gender Score: 0     For questions or updates, please contact [place] Please consult [email or web address] for contact info under    Signed, Margie Billet, NP  08/21/2020 4:28 PM

## 2020-08-21 NOTE — Progress Notes (Signed)
PHARMACIST LIPID MONITORING   John [name] is a 75 y.o. male admitted on 08/20/2020 for vascular procedure, s/p R common femoral endarterectomy and R common iliac stent.  Pharmacy has been consulted to optimize lipid-lowering therapy with the indication of secondary prevention for clinical ASCVD.  Recent Labs:  Lipid Panel (last 6 months):   Lab Results  Component Value Date   CHOL 126 08/21/2020   TRIG 89 08/21/2020   HDL 28 (L) 08/21/2020   CHOLHDL 4.5 08/21/2020   VLDL 18 08/21/2020   LDLCALC 80 08/21/2020    Hepatic function panel (last 6 months):   Lab Results  Component Value Date   AST 18 08/14/2020   ALT 29 08/14/2020   ALKPHOS 117 08/14/2020   BILITOT 0.3 08/14/2020    SCr (since admission):   Serum creatinine: 1.14 mg/dL 08/21/20 0530 Estimated creatinine clearance: 51.3 mL/min  Current therapy and lipid therapy tolerance Current lipid-lowering therapy: Pravastatin 40 mg MWF Previous lipid-lowering therapies (if applicable): atorvastatin and rosuvastatin Documented or reported allergies or intolerances to lipid-lowering therapies (if applicable): not documented in Epic allergies, but patient reported muscle aches from atorvastatin and rosuvastatin, as well as daily pravastatin  Assessment:    Patient reports prior muscle aches from daily Pravastatin. Reduced to MWF several years ago and tolerating. And also from other statins, whose names he did not recall.  Documentation found in 01/2012 outpatient note.  Plan:    No statin changes at this time.  Added statin intolerance history to allergies.  Arty Baumgartner, PharmD 08/21/2020, 1:25 PM

## 2020-08-21 NOTE — Discharge Instructions (Signed)
 [facility] [place]  Discharge instructions  Lower Extremity Bypass Surgery  Please refer to the following instruction for your post-procedure care. Your surgeon or physician assistant will discuss any changes with you.  Activity  You are encouraged to walk as much as you can. You can slowly return to normal activities during the month after your surgery. Avoid strenuous activity and heavy lifting until your doctor tells you it's OK. Avoid activities such as vacuuming or swinging a golf club. Do not drive until your doctor give the OK and you are no longer taking prescription pain medications. It is also normal to have difficulty with sleep habits, eating and bowel movement after surgery. These will go away with time.  Bathing/Showering  Shower daily after you go home. Do not soak in a bathtub, hot tub, or swim until the incision heals completely.  Incision Care  Clean your incision with mild soap and water. Shower every day. Pat the area dry with a clean towel. You do not need a bandage unless otherwise instructed. Do not apply any ointments or creams to your incision. If you have open wounds you will be instructed how to care for them or a visiting nurse may be arranged for you. If you have staples or sutures along your incision they will be removed at your post-op appointment. You may have skin glue on your incision. Do not peel it off. It will come off on its own in about one week.  Wash the groin wound with soap and water daily and pat dry. (No tub bath-only shower)  Then put a dry gauze or washcloth in the groin to keep this area dry to help prevent wound infection.  Do this daily and as needed.  Do not use Vaseline or neosporin on your incisions.  Only use soap and water on your incisions and then protect and keep dry.  Diet  Resume your normal diet. There are no special food restrictions following this procedure. A low fat/ low cholesterol diet is  recommended for all patients with vascular disease. In order to heal from your surgery, it is CRITICAL to get adequate nutrition. Your body requires vitamins, minerals, and protein. Vegetables are the best source of vitamins and minerals. Vegetables also provide the perfect balance of protein. Processed food has little nutritional value, so try to avoid this.  Medications  Resume taking all your medications unless your doctor or physician assistant tells you not to. If your incision is causing pain, you may take over-the-counter pain relievers such as acetaminophen (Tylenol). If you were prescribed a stronger pain medication, please aware these medication can cause nausea and constipation. Prevent nausea by taking the medication with a snack or meal. Avoid constipation by drinking plenty of fluids and eating foods with high amount of fiber, such as fruits, vegetables, and grains. Take Colace 100 mg (an over-the-counter stool softener) twice a day as needed for constipation.  Do not take Tylenol if you are taking prescription pain medications.  Follow Up  Our office will schedule a follow up appointment 2-3 weeks following discharge.  Please call us immediately for any of the following conditions  Severe or worsening pain in your legs or feet while at rest or while walking Increase pain, redness, warmth, or drainage (pus) from your incision site(s) Fever of 101 degree or higher The swelling in your leg with the bypass suddenly worsens and becomes more painful than when you were in the hospital If you have   been instructed to feel your graft pulse then you should do so every day. If you can no longer feel this pulse, call the office immediately. Not all patients are given this instruction.  Leg swelling is common after leg bypass surgery.  The swelling should improve over a few months following surgery. To improve the swelling, you may elevate your legs above the level of your heart while you are  sitting or resting. Your surgeon or physician assistant may ask you to apply an ACE wrap or wear compression (TED) stockings to help to reduce swelling.  Reduce your risk of vascular disease  Stop smoking. If you would like help call [facility] at 1-800-QUIT-NOW (1-800-784-8669) or [place] at 336-586-4000.  Manage your cholesterol Maintain a desired weight Control your diabetes weight Control your diabetes Keep your blood pressure down  If you have any questions, please call the office at [phone]  

## 2020-08-21 NOTE — Progress Notes (Addendum)
Physical Therapy Treatment Patient Details Name: John [name] MRN: [id number] DOB: [birth year] Today's Date: 08/21/2020    History of Present Illness Pt is a 75 y.o. male with RLE ischemia admitted and s/p R iliac arteriogram, angioplasty, stenting and R femoral endarterectomy on 08/20/20. PMH includes CHF, COPD (O2 PRN), DM, PVD, macular degeneration, OSA, neuropathy, NSCLC, BPH.   PT Comments    Pt progressing with mobility. Today's session focused on transfer and gait training wihtout DME; pt requiring intermittent min guard for stability, stability improved with UE support. Pt limited by fatigue, SOB and tachycardia with ambulation (HR up to 170s, down to 117 with seated rest; RN aware). Pt requires 2L O2 [place] to maintain SpO2 >/88%. Pt hopeful for d/c home today; if to remain admitted, will continue to follow acutely.   Follow Up Recommendations  No PT follow up;Supervision for mobility/OOB     Equipment Recommendations   (shower seat)    Recommendations for Other Services       Precautions / Restrictions Precautions Precautions: Fall;Other (comment) Precaution Comments: Watch SpO2, HR (tachy up to 170s) Restrictions Weight Bearing Restrictions: No    Mobility  Bed Mobility               General bed mobility comments: Received sitting EOB    Transfers Overall transfer level: Needs assistance Equipment used: None Transfers: Sit to/from Stand Sit to Stand: Supervision         General transfer comment: Multiple sit<>stands from EOB without DME, supervision for safety/lines  Ambulation/Gait Ambulation/Gait assistance: Min guard Gait Distance (Feet): 172 Feet Assistive device: None;IV Pole Gait Pattern/deviations: Step-through pattern;Decreased stride length;Drifts right/left Gait velocity: Decreased   General Gait Details: Slow, mildly unsteady gait without UE support, intermittent min guard for balance; distance limited by fatigue and SOB with SpO2 down to  80s% on RA (unreliable pulse ox reading); HR up to 175 with ambulation; pt using IV pole for added stability last ~40'   Stairs Stairs:  (deferred secondary to tachycardia; pt also declining need for stair training, reports confidence ascending 2 short steps into home with rail)           Wheelchair Mobility    Modified Rankin (Stroke Patients Only)       Balance Overall balance assessment: Needs assistance   Sitting balance-Leahy Scale: Good       Standing balance-Leahy Scale: Fair                              Cognition Arousal/Alertness: Awake/alert Behavior During Therapy: WFL for tasks assessed/performed Overall Cognitive Status: Within Functional Limits for tasks assessed                                 General Comments: WFL for simple tasks, although question decreased insight into medical status and decreased attention; focused on wanting to d/c home today      Exercises      General Comments General comments (skin integrity, edema, etc.): Pt's significant other present. SpO2 down to 80s% on RA with ambulation, quick return to 93-95% on 2L O2; HR up to 170s, return to 117 with seated rest      Pertinent Vitals/Pain Pain Assessment: No/denies pain Pain Intervention(s): Monitored during session    Home Living  Prior Function            PT Goals (current goals can now be found in the care plan section) Acute Rehab PT Goals Time For Goal Achievement: 09/03/20 Progress towards PT goals: Progressing toward goals    Frequency    Min 3X/week      PT Plan Current plan remains appropriate    Co-evaluation              AM-PAC PT "6 Clicks" Mobility   Outcome Measure  Help needed turning from your back to your side while in a flat bed without using bedrails?: None Help needed moving from lying on your back to sitting on the side of a flat bed without using bedrails?: A Little Help  needed moving to and from a bed to a chair (including a wheelchair)?: A Little Help needed standing up from a chair using your arms (e.g., wheelchair or bedside chair)?: A Little Help needed to walk in hospital room?: A Little Help needed climbing 3-5 steps with a railing? : A Little 6 Click Score: 19    End of Session Equipment Utilized During Treatment: Gait belt;Oxygen Activity Tolerance: Patient tolerated treatment well;Treatment limited secondary to medical complications (Comment) Patient left: in bed;with call bell/phone within reach;with family/visitor present;Other (comment) (seated EOB) Nurse Communication: Mobility status PT Visit Diagnosis: Other abnormalities of gait and mobility (R26.89);Difficulty in walking, not elsewhere classified (R26.2)     Time: 2585-2778 PT Time Calculation (min) (ACUTE ONLY): 26 min  Charges:  $Gait Training: 8-22 mins $Therapeutic Activity: 8-22 mins                     Mabeline Caras, PT, DPT Acute Rehabilitation Services  Pager [phone] Office [place] 08/21/2020, 9:52 AM

## 2020-08-21 NOTE — Progress Notes (Signed)
Mobility Specialist: Progress Note   08/21/20 1404  Mobility  Activity Ambulated in hall  Level of Assistance Contact guard assist, steadying assist  Assistive Device Front wheel walker  Distance Ambulated (ft) 220 ft  Mobility Ambulated with assistance in hallway  Mobility Response Tolerated well  Mobility performed by Mobility specialist  $Mobility charge 1 Mobility   Pre-Mobility: 87 HR, 96% SpO2 During Mobility: 97 HR Post-Mobility: 87 HR, 98% SpO2  Pt was contact guard with gait belt due to pt being unsteady during ambulation. Pt ambulated on 2 L/min [place], asx. Pt sitting EOB after walk with bed alarm on.   [facility] John [name] Mobility Specialist Mobility Specialist Phone: [phone]

## 2020-08-22 ENCOUNTER — Other Ambulatory Visit: Payer: Self-pay

## 2020-08-22 ENCOUNTER — Telehealth: Payer: Self-pay

## 2020-08-22 DIAGNOSIS — E785 Hyperlipidemia, unspecified: Secondary | ICD-10-CM

## 2020-08-22 DIAGNOSIS — J449 Chronic obstructive pulmonary disease, unspecified: Secondary | ICD-10-CM

## 2020-08-22 DIAGNOSIS — I739 Peripheral vascular disease, unspecified: Secondary | ICD-10-CM

## 2020-08-22 DIAGNOSIS — E1169 Type 2 diabetes mellitus with other specified complication: Secondary | ICD-10-CM

## 2020-08-22 LAB — GLUCOSE, CAPILLARY: Glucose-Capillary: 134 mg/dL — ABNORMAL HIGH (ref 70–99)

## 2020-08-22 MED ORDER — APIXABAN 5 MG PO TABS: 5.0000 mg | ORAL_TABLET | Freq: Two times a day (BID) | ORAL | 3 refills | Status: DC

## 2020-08-22 MED ORDER — METOPROLOL SUCCINATE ER 25 MG PO TB24: 37.5000 mg | ORAL_TABLET | Freq: Every day | ORAL | Status: DC

## 2020-08-22 MED ORDER — EZETIMIBE 10 MG PO TABS: 10.0000 mg | ORAL_TABLET | Freq: Every day | ORAL | 1 refills | Status: DC

## 2020-08-22 NOTE — TOC Transition Note (Signed)
Transition of Care (TOC) - CM/SW Discharge Note Marvetta Gibbons RN, BSN Transitions of Care Unit 4E- RN Case Manager See Treatment Team for direct phone #    Patient Details  Name: John [name] MRN: [id number] Date of Birth: 1946-03-04  Transition of Care [facility]) CM/SW Contact:  Dawayne Patricia, RN Phone Number: 08/22/2020, 11:35 AM   Clinical Narrative:    Noted pt started on Eliquis- benefits check submitted, also notified by Ramond Marrow with [facility] that pt is active with them for [facility]- will have [place] order added for resumption of [place] services.      Final next level of care: [place] Barriers to Discharge: No Barriers Identified   Patient Goals and CMS Choice     Choice offered to / list presented to : NA  Discharge Placement               Home with [facility]        Discharge Plan and Services     Post Acute Care Choice: Resumption of Svcs/PTA Provider,Home Health          DME Arranged: Shower stool DME Agency: [employer] Date DME Agency Contacted: 08/21/20 Time DME Agency Contacted: 1000 Representative spoke with at DME Agency: Freda Munro HH Arranged: RN [place] Agency: [place] ([place]) Date [place]: 08/22/20 Time [place]: [id number] Representative spoke with at [place]: [place] ([place]) Interventions     Readmission Risk Interventions Readmission Risk Prevention Plan 08/21/2020 06/04/2020 05/08/2020  Transportation Screening Complete Complete Complete  PCP or Specialist Appt within 3-5 Days Complete - -  Home Care Screening - - Complete  Medication Review (RN CM) - - Complete  HRI or Home Care Consult Complete Complete -  Social Work Consult for [place] Planning/Counseling Complete Complete -  Palliative Care Screening Not Applicable Not Applicable -  Medication Review Press photographer) Complete Complete -  Some recent data might be hidden

## 2020-08-22 NOTE — Telephone Encounter (Signed)
MD sent message to inquire for patient if he is to continue dutasteride.

## 2020-08-22 NOTE — Progress Notes (Addendum)
Progress Note    08/22/2020 8:27 AM 2 Days Post-Op  Subjective:  No complaints.    Afebrile HR 80's a flutter 295'A-213'Y systolic 86%  5HQ4ON  Vitals:   08/22/20 0222 08/22/20 0822  BP: 108/63 (!) 108/56  Pulse: 86 88  Resp: 20 18  Temp: 98.1 F (36.7 C) 98.1 F (36.7 C)  SpO2: 96% 98%    Physical Exam: Cardiac:  regular Lungs:  Non labored Incisions:  Clean and dry Extremities:  Brisk right DP doppler signal.  Right calf is soft   CBC    Component Value Date/Time   WBC 15.4 (H) 08/21/2020 0530   RBC 4.17 (L) 08/21/2020 0530   HGB 10.2 (L) 08/21/2020 0530   HGB 11.6 (L) 08/14/2020 1549   HCT 36.3 (L) 08/21/2020 0530   HCT 37.3 (L) 08/14/2020 1549   PLT 260 08/21/2020 0530   PLT 424 08/14/2020 1549   MCV 87.1 08/21/2020 0530   MCV 80 08/14/2020 1549   MCH 24.5 (L) 08/21/2020 0530   MCHC 28.1 (L) 08/21/2020 0530   RDW 18.3 (H) 08/21/2020 0530   RDW 16.0 (H) 08/14/2020 1549   LYMPHSABS 0.6 (L) 08/14/2020 1549   MONOABS 0.7 08/13/2020 1402   EOSABS 0.1 08/14/2020 1549   BASOSABS 0.0 08/14/2020 1549    BMET    Component Value Date/Time   NA 139 08/21/2020 0530   NA 142 08/14/2020 1549   K 4.3 08/21/2020 0530   CL 106 08/21/2020 0530   CO2 26 08/21/2020 0530   GLUCOSE 154 (H) 08/21/2020 0530   BUN 17 08/21/2020 0530   BUN 21 08/14/2020 1549   CREATININE 1.14 08/21/2020 0530   CALCIUM 8.6 (L) 08/21/2020 0530   GFRNONAA >60 08/21/2020 0530   GFRAA 102 05/24/2020 1238    INR    Component Value Date/Time   INR 1.0 08/14/2020 1123     Intake/Output Summary (Last 24 hours) at 08/22/2020 0827 Last data filed at 08/21/2020 1200 Gross per 24 hour  Intake 0 ml  Output --  Net 0 ml     Assessment:  75 y.o. male is s/p:  right femoral endarterectomy with retrograde right common iliac artery angioplasty with stent placement and IVUS for right lower extremity short distance lifestyle limiting claudication  2 Days Post-Op  Plan: -pt with brisk  right DP doppler signal and incision looks fine. -pt was noted to be in a flutter yesterday and cardiology consulted and appreciated.  Pt was started on Eliquis and possible outpatient cardioversion in a couple of weeks.  Echo done late yesterday.  Will dc home after pt has been seen by cardiology. -given Eliquis was started, will stop asa and continue plavix in light of iliac stent.  -pt has orientation appt tomorrow at 12:30.  Discussed with him he is okay to attend this if he is feeling up to in.     Leontine Locket, PA-C [facility] [phone] 08/22/2020 8:27 AM  I have seen and evaluated the patient. I agree with the PA note as documented above.  75 year old male now postop day 2 status post a right common femoral endarterectomy with bovine patch and retrograde right common iliac artery angioplasty with stent placement.  His discharge was delayed yesterday given he was in a flutter with a rapid heart rate when he got up.  Appreciate cardiology consult.  They recommended starting him on Eliquis.  Much more stable from a heart rate standpoint and is otherwise not having any symptoms.  They plan outpatient cardioversion.  We will plan discharge today on Eliquis (per cardiology) and Plavix (iliac stent).  He has a 1+ palpable DP pulse in the right foot.  Right groin looks c/d/i.  States his leg feels much better since walking with therapy.  When he follows up in the office in about 3 weeks we can transition him to aspirin and Eliquis.  Marty Heck, MD [facility] [place] Office: [phone]

## 2020-08-22 NOTE — TOC Benefit Eligibility Note (Signed)
Transition of Care [facility]) Benefit Eligibility Note    Patient Details  Name: John [name] MRN: [id number] Date of Birth: [birth year]   Medication/Dose: Arne [place]  5 MG BID        Prescription Coverage Preferred Pharmacy: [facility]     Co-Pay: $4.00        Additional Notes: REG MEDICAID OF [place]  : EFF-DATE -08-05-2016    CO-PAY- $ 4.00 FOR EACH PRESCRIPTION    Memory Argue Phone Number: 08/22/2020, 10:12 AM

## 2020-08-22 NOTE — Progress Notes (Incomplete)
Radiation Oncology         [phone]) [phone] ________________________________  Initial {In/Out}patient Consultation  Name: John [name] MRN: [id number]  Date: 08/23/2020  DOB: 08/28/1945  TG:GYIRS, [name], FNP  Derek Jack, MD   REFERRING PHYSICIAN: Derek Jack, MD  DIAGNOSIS: The encounter diagnosis was Non-small cell lung cancer, right ([place]). Stage IIIA (cT1b(2), cN2  {diagnosis}  HISTORY OF PRESENT ILLNESS::John [name] is a 75 y.o. male who is accompanied by ***. he is seen as a courtesy of *** for an opinion concerning radiation therapy as part of management for his recently diagnosed non-small cell lung cancer. The patient presented to *** on *** with lung cancer in 1/11 2018.  he would benefit from radiation oncology evaluation for SBRT of both lung lesions. The patient underwent a biopsy on {date} showing: ***  PREVIOUS RADIATION THERAPY: {EXAM; YES/NO:19492::"No"}  PAST MEDICAL HISTORY:  Past Medical History:  Diagnosis Date  . Adenocarcinoma of lung, right ([name]) 04/18/2016  . Anxiety   . Arthritis   . Asthma   . BPH (benign prostatic hyperplasia)    with urinary retention 02/06/20  . CHF (congestive heart failure) ([place])   . COPD (chronic obstructive pulmonary disease) ([place])   . Depression   . Diabetes mellitus without complication (H[facility])    no meds  . Diabetes mellitus, type II ([place])   . DM (diabetes mellitus) ([place]) 07/09/2016  . Dyspnea   . History of kidney stones   . Hyperlipidemia   . Hypertension   . Hypertension   . Hypothyroidism   . Macular degeneration   . Neuropathy   . Non-small cell lung cancer, right ([name]) 04/18/2016  . Peripheral vascular disease ([place])   . Prostatitis   . Pulmonary nodule, left 07/16/2016  . Sleep apnea    cpap    PAST SURGICAL HISTORY: Past Surgical History:  Procedure Laterality Date  . ABDOMINAL AORTOGRAM W/LOWER EXTREMITY Left 02/06/2020   Procedure: ABDOMINAL AORTOGRAM W/LOWER EXTREMITY;  Surgeon:  Lorretta Harp, MD;  Location: [facility];  Service: Cardiovascular;  Laterality: Left;  . CATARACT EXTRACTION, BILATERAL Bilateral   . ENDARTERECTOMY FEMORAL Right 08/20/2020   Procedure: ENDARTERECTOMY  RIGHT FEMORAL ARTERY;  Surgeon: Marty Heck, MD;  Location: [facility];  Service: Vascular;  Laterality: Right;  . INSERTION OF ILIAC STENT Right 08/20/2020   Procedure: RETROGRADE INSERTION OF RIGHT ILIAC STENT;  Surgeon: Marty Heck, MD;  Location: [facility];  Service: Vascular;  Laterality: Right;  . INTRAOPERATIVE ARTERIOGRAM Right 08/20/2020   Procedure: INTRA OPERATIVE ARTERIOGRAM ILIAC;  Surgeon: Marty Heck, MD;  Location: [facility];  Service: Vascular;  Laterality: Right;  . PATCH ANGIOPLASTY Right 08/20/2020   Procedure: PATCH ANGIOPLASTY RIGHT FEMORAL ARTERY;  Surgeon: Marty Heck, MD;  Location: [facility];  Service: Vascular;  Laterality: Right;  . PORTACATH PLACEMENT Left 06/13/2016   Procedure: INSERTION PORT-A-CATH;  Surgeon: Aviva Signs, MD;  Location: [facility];  Service: General;  Laterality: Left;  . TRANSURETHRAL RESECTION OF PROSTATE N/A 05/31/2020   Procedure: TRANSURETHRAL RESECTION OF THE PROSTATE (TURP);  Surgeon: Cleon Gustin, MD;  Location: [facility];  Service: Urology;  Laterality: N/A;  . VIDEO BRONCHOSCOPY WITH ENDOBRONCHIAL NAVIGATION N/A 05/28/2016   Procedure: VIDEO BRONCHOSCOPY WITH ENDOBRONCHIAL NAVIGATION;  Surgeon: [name], MD;  Location: [facility];  Service: Thoracic;  Laterality: N/A;  . VIDEO BRONCHOSCOPY WITH ENDOBRONCHIAL ULTRASOUND N/A 05/28/2016   Procedure: VIDEO BRONCHOSCOPY WITH ENDOBRONCHIAL ULTRASOUND;  Surgeon: Revonda Standard  [name], MD;  Location: [facility];  Service: Thoracic;  Laterality: N/A;    FAMILY HISTORY:  Family History  Problem Relation Age of Onset  . Hypertension Mother   . Diabetes Father   . Heart disease Father   . Stroke Father   . Hypertension Sister     SOCIAL HISTORY:  Social History    Tobacco Use  . Smoking status: Current Every Day Smoker    Packs/day: 0.50    Years: 55.00    Pack years: 27.50    Types: Cigarettes    Start date: 03/11/1961  . Smokeless tobacco: Never Used  . Tobacco comment: 1/2 pack to 1 pack per day 12/14/19  Vaping Use  . Vaping Use: Never used  Substance Use Topics  . Alcohol use: No  . Drug use: No    ALLERGIES:  Allergies  Allergen Reactions  . Jardiance [Empagliflozin] Other (See Comments)    FEELS SLUGGISH, TIRED  . Lopressor [Metoprolol] Other (See Comments)    Fatigue  . Atorvastatin     Muscle aches - tolerating Pravastatin 40 mg MWF  . Rosuvastatin     Muscle aches - tolerating Pravastatin 40 mg MWF  . Sulfa Antibiotics Swelling    Mouth swelling    MEDICATIONS:  Current Outpatient Medications  Medication Sig Dispense Refill  . albuterol (VENTOLIN HFA) 108 (90 Base) MCG/ACT inhaler Inhale 2 puffs into the lungs every 4 (four) hours as needed for wheezing or shortness of breath. 8 g 6  . apixaban (ELIQUIS) 5 MG TABS tablet Take 1 tablet (5 mg total) by mouth 2 (two) times daily. 60 tablet 3  . aspirin EC 81 MG tablet Take 81 mg by mouth daily.    . bacitracin 500 UNIT/GM ointment Apply 1 application topically 2 (two) times daily. 15 g 0  . Blood Glucose Monitoring Suppl (ONETOUCH VERIO REFLECT) w/Device KIT Use to test blood sugars daily as directed. DX: E11.9 1 kit 1  . cloNIDine (CATAPRES) 0.1 MG tablet TAKE 1 TABLET (0.1 MG TOTAL) BY MOUTH 3 (THREE) TIMES DAILY. 270 tablet 0  . clopidogrel (PLAVIX) 75 MG tablet Take 1 tablet (75 mg total) by mouth daily at 6 (six) AM. 30 tablet 11  . doxycycline (VIBRA-TABS) 100 MG tablet Take 1 tablet (100 mg total) by mouth 2 (two) times daily. 20 tablet 0  . dutasteride (AVODART) 0.5 MG capsule TAKE 1 CAPSULE BY MOUTH EVERY DAY 90 capsule 1  . ezetimibe (ZETIA) 10 MG tablet Take 1 tablet (10 mg total) by mouth daily. 30 tablet 1  . fluticasone (FLONASE) 50 MCG/ACT nasal spray Place 1  spray into both nostrils daily as needed for allergies or rhinitis. (Patient taking differently: Place 1 spray into both nostrils daily.) 18.2 mL 2  . furosemide (LASIX) 20 MG tablet Take 40 mg in AM and 20 mg in afternoon (Patient taking differently: Take 20-40 mg by mouth See admin instructions. Take 40 mg in AM and 20 mg in evening) 180 tablet 2  . gabapentin (NEURONTIN) 300 MG capsule TAKE 1 CAPSULE BY MOUTH THREE TIMES A DAY (Patient taking differently: Take 300 mg by mouth 3 (three) times daily.) 270 capsule 0  . glucose blood (ONETOUCH VERIO) test strip Use to test blood sugars daily as directed. DX: E11.9 100 each 12  . HYDROcodone-acetaminophen (NORCO) 5-325 MG tablet Take 1 tablet by mouth every 6 (six) hours as needed for moderate pain. 20 tablet 0  . levothyroxine (SYNTHROID) 50 MCG tablet Take  1 tablet (50 mcg total) by mouth daily. 30 tablet 2  . linaclotide (LINZESS) 145 MCG CAPS capsule Take 1 capsule (145 mcg total) by mouth daily. To regulate bowel movements 30 capsule 5  . losartan (COZAAR) 50 MG tablet Take 1 tablet (50 mg total) by mouth daily. 90 tablet 3  . meloxicam (MOBIC) 7.5 MG tablet Take 1 tablet (7.5 mg total) by mouth daily as needed for pain (back pain). 90 tablet 0  . metFORMIN (GLUCOPHAGE-XR) 500 MG 24 hr tablet Take 1 tablet (500 mg total) by mouth daily with breakfast. 90 tablet 1  . Meth-Hyo-M Bl-Na Phos-Ph Sal (URO-MP) 118 MG CAPS TAKE 1 CAPSULE (118 MG TOTAL) BY MOUTH 2 (TWO) TIMES DAILY AS NEEDED. (Patient taking differently: Take 118 mg by mouth daily as needed (Uninary burning).) 30 capsule 3  . metoprolol succinate (TOPROL-XL) 25 MG 24 hr tablet Take 1.5 tablets (37.5 mg total) by mouth daily. Take with or immediately following a meal.    . ondansetron (ZOFRAN-ODT) 4 MG disintegrating tablet TAKE 1 TABLET BY MOUTH EVERY 8 HOURS AS NEEDED FOR NAUSEA AND VOMITING (Patient taking differently: Take 4 mg by mouth every 8 (eight) hours as needed for vomiting or  nausea.) 20 tablet 0  . OneTouch Delica Lancets 08M MISC Use to test blood sugars daily as directed. DX: E11.9 100 each 1  . polyethylene glycol (MIRALAX / GLYCOLAX) 17 g packet Take 17 g by mouth daily as needed for moderate constipation or severe constipation. 14 each 0  . pravastatin (PRAVACHOL) 40 MG tablet TAKE 1 TABLET (40 MG TOTAL) BY MOUTH ON MONDAYS , WEDNESDAYS, AND FRIDAYS AS DIRECTED (Patient taking differently: Take 40 mg by mouth every Monday, Wednesday, and Friday.) 48 tablet 3  . Semaglutide (RYBELSUS) 7 MG TABS Take 7 mg by mouth daily. 90 tablet 1  . temazepam (RESTORIL) 7.5 MG capsule Take 1 capsule (7.5 mg total) by mouth at bedtime as needed for sleep. 30 capsule 1   No current facility-administered medications for this encounter.    REVIEW OF SYSTEMS:  A 10+ POINT REVIEW OF SYSTEMS WAS OBTAINED including neurology, dermatology, psychiatry, cardiac, respiratory, lymph, extremities, GI, GU, musculoskeletal, constitutional, reproductive, HEENT. ***   PHYSICAL EXAM:  vitals were not taken for this visit.   General: Alert and oriented, in no acute distress HEENT: Head is normocephalic. Extraocular movements are intact. Oropharynx is clear. Neck: Neck is supple, no palpable cervical or supraclavicular lymphadenopathy. Heart: Regular in rate and rhythm with no murmurs, rubs, or gallops. Chest: Clear to auscultation bilaterally, with no rhonchi, wheezes, or rales. Abdomen: Soft, nontender, nondistended, with no rigidity or guarding. Extremities: No cyanosis or edema. Lymphatics: see Neck Exam Skin: No concerning lesions. Musculoskeletal: symmetric strength and muscle tone throughout. Neurologic: Cranial nerves II through XII are grossly intact. No obvious focalities. Speech is fluent. Coordination is intact. Psychiatric: Judgment and insight are intact. Affect is appropriate. ***  ECOG = ***  0 - Asymptomatic (Fully active, able to carry on all predisease activities without  restriction)  1 - Symptomatic but completely ambulatory (Restricted in physically strenuous activity but ambulatory and able to carry out work of a light or sedentary nature. For example, light housework, office work)  2 - Symptomatic, <50% in bed during the day (Ambulatory and capable of all self care but unable to carry out any work activities. Up and about more than 50% of waking hours)  3 - Symptomatic, >50% in bed, but not bedbound (Capable of  only limited self-care, confined to bed or chair 50% or more of waking hours)  4 - Bedbound (Completely disabled. Cannot carry on any self-care. Totally confined to bed or chair)  5 - Death   Eustace Pen MM, Creech RH, Tormey DC, et al. [phone]). "Toxicity and response criteria of the [facility]". [place] Oncol. 5 (6): 649-55  LABORATORY DATA:  Lab Results  Component Value Date   WBC 15.4 (H) 08/21/2020   HGB 10.2 (L) 08/21/2020   HCT 36.3 (L) 08/21/2020   MCV 87.1 08/21/2020   PLT 260 08/21/2020   NEUTROABS 13.9 (H) 08/14/2020   Lab Results  Component Value Date   NA 139 08/21/2020   K 4.3 08/21/2020   CL 106 08/21/2020   CO2 26 08/21/2020   GLUCOSE 154 (H) 08/21/2020   CREATININE 1.14 08/21/2020   CALCIUM 8.6 (L) 08/21/2020      RADIOGRAPHY: DG Chest 2 View per protocol  Result Date: 08/15/2020 CLINICAL DATA:  Peripheral vascular disease, preoperative evaluation, COPD, history of lung cancer EXAM: CHEST - 2 VIEW COMPARISON:  06/04/2020, 08/13/2020 FINDINGS: Frontal and lateral views of the chest demonstrates stable left chest wall port tip in the region of the superior vena cava. No acute airspace disease, effusion, or pneumothorax. There is a 1.2 cm right upper lobe pulmonary nodule corresponding to a metabolically active spiculated nodule on preceding PET scan. Metabolically active left upper lobe pulmonary nodule is faintly visualized in the first anterior intercostal space, measuring 1 cm on this exam. Please  refer to PET-CT 08/13/2020 for full description of findings. No acute bony abnormalities. IMPRESSION: 1. Bilateral upper lobe pulmonary nodules consistent with malignancy, fully described on PET CT performed 08/13/2020. Electronically Signed   By: Randa Ngo M.D.   On: 08/15/2020 18:06   NM PET Image Restag (PS) Skull Base To Thigh  Result Date: 08/14/2020 CLINICAL DATA:  Subsequent treatment strategy for non-small-cell lung cancer diagnosed in 2015. Chemotherapy and radiation therapy. EXAM: NUCLEAR MEDICINE PET SKULL BASE TO THIGH TECHNIQUE: 10.1 mCi F-18 FDG was injected intravenously. Full-ring PET imaging was performed from the skull base to thigh after the radiotracer. CT data was obtained and used for attenuation correction and anatomic localization. Fasting blood glucose: 173 mg/dl COMPARISON:  11/21/2019 FINDINGS: Mediastinal blood pool activity: SUV max 2.5 Liver activity: SUV max NA NECK: No areas of abnormal hypermetabolism. Incidental CT findings: Bilateral carotid atherosclerosis. No cervical adenopathy. CHEST: Left apical pulmonary nodule measures 8 mm and a S.U.V. max of 2.3 on 80/3 versus 7 mm and a S.U.V. max of 1.4 on the prior exam. Lateral right upper lobe pulmonary nodule measures 12 mm and a S.U.V. max of 7.9 on 84/3 versus 9 mm and a S.U.V. max of 2.4 on the prior exam. Redemonstration of right paramediastinal and less so perihilar presumably radiation induced architectural distortion with low-level hypermetabolism. Example at a S.U.V. max of 3.1 on 95/3. No thoracic nodal hypermetabolism. Incidental CT findings: Left Port-A-Cath tip at low SVC. Aortic and coronary artery calcification. New trace right pleural fluid. Centrilobular emphysema. Smaller left apical pulmonary nodules include a 4 mm on 73/3 are felt to be similar. 4 mm left lower lobe pulmonary nodule on 119/3 is unchanged. ABDOMEN/PELVIS: No abdominopelvic parenchymal or nodal hypermetabolism. Incidental CT findings: Mild  left adrenal thickening is unchanged. Low-density right renal lesion is presumably a cyst. Caudate lobe enlargement with subtle irregular hepatic capsule. Extensive colonic diverticulosis. Interval TURP defect. SKELETON: No abnormal marrow activity. Incidental  CT findings: Mild bilateral hip osteoarthritis. IMPRESSION: 1. Further progression of bilateral upper lobe hypermetabolic pulmonary nodules, metastatic disease versus metachronous primaries. 2. No evidence of thoracic nodal or distant metastasis. 3. Hepatic morphology for which mild cirrhosis is suspected. Correlate with risk factors. 4. New trace right pleural fluid. 5. Incidental findings, including: Aortic atherosclerosis (ICD10-I70.0), coronary artery atherosclerosis and emphysema (ICD10-J43.9). Electronically Signed   By: Abigail Miyamoto M.D.   On: 08/14/2020 13:40   DG Abd 2 Views  Result Date: 08/15/2020 CLINICAL DATA:  Abdominal pain, lung cancer EXAM: ABDOMEN - 2 VIEW COMPARISON:  12/30/2019, 08/13/2020 FINDINGS: Supine and upright frontal views of the abdomen and pelvis are obtained. The bowel gas pattern is unremarkable without obstruction or ileus. There are no masses or abnormal calcifications. Stable diffuse lumbar spondylosis. No acute bony abnormalities. IMPRESSION: 1. Unremarkable bowel gas pattern. Electronically Signed   By: Randa Ngo M.D.   On: 08/15/2020 18:07   ECHOCARDIOGRAM COMPLETE  Result Date: 08/21/2020    ECHOCARDIOGRAM REPORT   Patient Name:   DERREL MOORE Date of Exam: 08/21/2020 Medical Rec #:  [id number]        Height:       66.0 in Accession #:    [id number]       Weight:       164.6 lb Date of Birth:  12/17/1945        BSA:          1.841 m Patient Age:    62 years         BP:           118/61 mmHg Patient Gender: M                HR:           87 bpm. Exam Location:  Inpatient Procedure: 2D Echo STAT ECHO                                 MODIFIED REPORT:  This report was modified by Rudean Haskell MD on  08/21/2020 due to Added                                   comparison.  Indications:     atrial flutter  History:         Patient has prior history of Echocardiogram examinations, most                  recent 01/15/2018. CHF, CAD, COPD, Arrythmias:Atrial Flutter;                  Risk Factors:Diabetes, Sleep Apnea, Current Smoker,                  Hypertension and Dyslipidemia.  Sonographer:     Johny Chess Referring Phys:  [id number] Margie Billet Diagnosing Phys: Rudean Haskell MD IMPRESSIONS  1. Left ventricular ejection fraction, by estimation, is 60 to 65%. The left ventricle has normal function. The left ventricle has no regional wall motion abnormalities. Left ventricular diastolic parameters are indeterminate.  2. Right ventricular systolic function is mildly reduced. The right ventricular size is normal.  3. The mitral valve is abnormal. No evidence of mitral valve regurgitation. The mean mitral valve gradient is 4.0 mmHg with average heart rate of 87 bpm. Moderate mitral annular calcification.  4.  The aortic valve is calcified. Aortic valve regurgitation is not visualized. Mild to moderate aortic valve sclerosis/calcification is present. Aortic valve area, by VTI measures 0.91 cm. Aortic valve mean gradient measures 8.0 mmHg. Aortic valve Vmax measures 1.88 m/s. Aortic valve acceleration time measures 97 msec. There is discordance between the AVA and gradients; DVI is 0.36 with LV stroke volume index of 30; Aortic stenosis may be underestimated in this study. Consider outpatient monitoring if clinically indicated. Comparison(s): A prior study was performed on 01/15/2018. Prior images reviewed side by side. Similar peak aortic gradients. FINDINGS  Left Ventricle: Left ventricular ejection fraction, by estimation, is 60 to 65%. The left ventricle has normal function. The left ventricle has no regional wall motion abnormalities. The left ventricular internal cavity size was normal in size. There is  no  left ventricular hypertrophy. Left ventricular diastolic parameters are indeterminate. Right Ventricle: The right ventricular size is normal. No increase in right ventricular wall thickness. Right ventricular systolic function is mildly reduced. Left Atrium: Left atrial size was normal in size. Right Atrium: Right atrial size was normal in size. Pericardium: There is no evidence of pericardial effusion. Mitral Valve: The mitral valve is abnormal. There is moderate thickening of the mitral valve leaflet(s). There is moderate calcification of the mitral valve leaflet(s). Moderate mitral annular calcification. No evidence of mitral valve regurgitation. MV peak gradient, 14.0 mmHg. The mean mitral valve gradient is 4.0 mmHg with average heart rate of 87 bpm. Tricuspid Valve: The tricuspid valve is normal in structure. Tricuspid valve regurgitation is not demonstrated. No evidence of tricuspid stenosis. Aortic Valve: The aortic valve is calcified. Aortic valve regurgitation is not visualized. Mild to moderate aortic valve sclerosis/calcification is present, without any evidence of aortic stenosis. Aortic valve mean gradient measures 8.0 mmHg. Aortic valve peak gradient measures 14.1 mmHg. Aortic valve area, by VTI measures 0.91 cm. Pulmonic Valve: The pulmonic valve was not well visualized. Pulmonic valve regurgitation is not visualized. No evidence of pulmonic stenosis. Aorta: The aortic root is normal in size and structure. IAS/Shunts: The atrial septum is grossly normal.  LEFT VENTRICLE PLAX 2D LVIDd:         4.30 cm  Diastology LVIDs:         2.70 cm  LV e' medial:    8.05 cm/s LV PW:         1.00 cm  LV E/e' medial:  19.4 LV IVS:        0.90 cm  LV e' lateral:   9.68 cm/s LVOT diam:     1.80 cm  LV E/e' lateral: 16.1 LV SV:         30 LV SV Index:   16 LVOT Area:     2.54 cm  RIGHT VENTRICLE            IVC RV S prime:     7.40 cm/s  IVC diam: 2.50 cm LEFT ATRIUM             Index       RIGHT ATRIUM           Index  LA diam:        4.20 cm 2.28 cm/m  RA Area:     14.70 cm LA Vol (A2C):   39.7 ml 21.57 ml/m RA Volume:   35.90 ml  19.50 ml/m LA Vol (A4C):   52.0 ml 28.25 ml/m LA Biplane Vol: 47.9 ml 26.02 ml/m  AORTIC VALVE AV Area (Vmax):  0.96 cm AV Area (Vmean):   0.91 cm AV Area (VTI):     0.91 cm AV Vmax:           188.00 cm/s AV Vmean:          128.500 cm/s AV VTI:            0.328 m AV Peak Grad:      14.1 mmHg AV Mean Grad:      8.0 mmHg LVOT Vmax:         71.00 cm/s LVOT Vmean:        46.000 cm/s LVOT VTI:          0.118 m LVOT/AV VTI ratio: 0.36 MITRAL VALVE MV Area (PHT): 6.83 cm     SHUNTS MV Area VTI:   1.07 cm     Systemic VTI:  0.12 m MV Peak grad:  14.0 mmHg    Systemic Diam: 1.80 cm MV Mean grad:  4.0 mmHg MV Vmax:       1.87 m/s MV Vmean:      82.7 cm/s MV Decel Time: 111 msec MV E velocity: 156.00 cm/s MV A velocity: 51.40 cm/s MV E/A ratio:  3.04 Rudean Haskell MD Electronically signed by Rudean Haskell MD Signature Date/Time: 08/21/2020/5:40:15 PM    Final (Updated)    HYBRID OR IMAGING ([place])  Result Date: 08/20/2020 There is no interpretation for this exam.  This order is for images obtained during a surgical procedure.  Please See "Surgeries" Tab for more information regarding the procedure.      IMPRESSION: {diagnosis}  ***  Today, I talked to the patient and family about the findings and work-up thus far.  We discussed the natural history of *** and general treatment, highlighting the role of radiotherapy in the management.  We discussed the available radiation techniques, and focused on the details of logistics and delivery.  We reviewed the anticipated acute and late sequelae associated with radiation in this setting.  The patient was encouraged to ask questions that I answered to the best of my ability. *** A patient consent form was discussed and signed.  We retained a copy for our records.  The patient would like to proceed with radiation and will be scheduled  for CT simulation.  PLAN: ***    *** minutes of total time was spent for this patient encounter, including preparation, face-to-face counseling with the patient and coordination of care, physical exam, and documentation of the encounter.   ------------------------------------------------  Blair Promise, PhD, MD   This document serves as a record of services personally performed by Gery Pray, MD. It was created on his behalf by Roney Mans, a trained medical scribe. The creation of this record is based on the scribe's personal observations and the provider's statements to them. This document has been checked and approved by the attending provider.

## 2020-08-22 NOTE — Progress Notes (Signed)
Cardiology Progress Note  Patient ID: John [name] MRN: [id number] DOB: Jun 21, 1945 Date of Encounter: 08/22/2020  Primary Cardiologist: [name], MD (Inactive)  Subjective   Chief Complaint: None.  HPI: Ready for discharge today.  Echo with normal EF.  Vascular surgery okay with Eliquis and Plavix.  Holding aspirin.  ROS:  All other ROS reviewed and negative. Pertinent positives noted in the HPI.     Inpatient Medications  Scheduled Meds: . apixaban  5 mg Oral BID  . cloNIDine  0.1 mg Oral TID  . clopidogrel  75 mg Oral Q0600  . docusate sodium  100 mg Oral Daily  . dutasteride  0.5 mg Oral Daily  . ezetimibe  10 mg Oral Daily  . furosemide  40 mg Oral Daily   And  . furosemide  20 mg Oral QPM  . gabapentin  300 mg Oral TID  . insulin aspart  0-15 Units Subcutaneous TID WC  . levothyroxine  50 mcg Oral Daily  . linaclotide  145 mcg Oral QAC breakfast  . losartan  50 mg Oral Daily  . metoprolol succinate  37.5 mg Oral Daily  . pantoprazole  40 mg Oral Daily  . pravastatin  40 mg Oral Q M,W,F   Continuous Infusions: . sodium chloride    . sodium chloride Stopped (08/20/20 1630)  . magnesium sulfate bolus IVPB     PRN Meds: sodium chloride, acetaminophen **OR** acetaminophen, albuterol, alum & mag hydroxide-simeth, bisacodyl, fluticasone, guaiFENesin-dextromethorphan, hydrALAZINE, HYDROmorphone (DILAUDID) injection, labetalol, magnesium sulfate bolus IVPB, oxyCODONE-acetaminophen, phenol, senna-docusate, sodium phosphate, temazepam   Vital Signs   Vitals:   08/21/20 2203 08/21/20 2343 08/22/20 0222 08/22/20 0822  BP: 115/66 (!) 108/57 108/63 (!) 108/56  Pulse:  86 86 88  Resp:  16 20 18   Temp:  98.4 F (36.9 C) 98.1 F (36.7 C) 98.1 F (36.7 C)  TempSrc:  Oral Oral Oral  SpO2:  100% 96% 98%  Weight:      Height:        Intake/Output Summary (Last 24 hours) at 08/22/2020 0904 Last data filed at 08/21/2020 1200 Gross per 24 hour  Intake 0 ml   Output --  Net 0 ml   Last 3 Weights 08/20/2020 08/15/2020 08/14/2020  Weight (lbs) 164 lb 9.6 oz 163 lb 11.2 oz 161 lb 3.2 oz  Weight (kg) 74.662 kg 74.254 kg 73.12 kg      Telemetry  Overnight telemetry shows atrial flutter heart rate 80, which I personally reviewed.   Physical Exam   Vitals:   08/21/20 2203 08/21/20 2343 08/22/20 0222 08/22/20 0822  BP: 115/66 (!) 108/57 108/63 (!) 108/56  Pulse:  86 86 88  Resp:  16 20 18   Temp:  98.4 F (36.9 C) 98.1 F (36.7 C) 98.1 F (36.7 C)  TempSrc:  Oral Oral Oral  SpO2:  100% 96% 98%  Weight:      Height:         Intake/Output Summary (Last 24 hours) at 08/22/2020 0904 Last data filed at 08/21/2020 1200 Gross per 24 hour  Intake 0 ml  Output --  Net 0 ml    Last 3 Weights 08/20/2020 08/15/2020 08/14/2020  Weight (lbs) 164 lb 9.6 oz 163 lb 11.2 oz 161 lb 3.2 oz  Weight (kg) 74.662 kg 74.254 kg 73.12 kg    Body mass index is 26.57 kg/m.   General: Well nourished, well developed, in no acute distress Head: Atraumatic, normal size  Eyes: PEERLA, EOMI  Neck: Supple, no JVD Endocrine: No thryomegaly Cardiac: Normal S1, S2; RRR; no murmurs, rubs, or gallops Lungs: Diminished breath sounds bilaterally Abd: Soft, nontender, no hepatomegaly  Ext: No edema, pulses 2+ Musculoskeletal: No deformities, BUE and BLE strength normal and equal Skin: Warm and dry, no rashes   Neuro: Alert and oriented to person, place, time, and situation, CNII-XII grossly intact, no focal deficits  Psych: Normal mood and affect   Labs  High Sensitivity Troponin:  No results for input(s): TROPONINIHS in the last 720 hours.   Cardiac EnzymesNo results for input(s): TROPONINI in the last 168 hours. No results for input(s): TROPIPOC in the last 168 hours.  Chemistry Recent Labs  Lab 08/20/20 0544 08/21/20 0530  NA 137 139  K 3.3* 4.3  CL 103 106  CO2 25 26  GLUCOSE 198* 154*  BUN 19 17  CREATININE 0.95 1.14  CALCIUM 8.9 8.6*  GFRNONAA >60 >60   ANIONGAP 9 7    Hematology Recent Labs  Lab 08/20/20 0544 08/21/20 0530  WBC 14.0* 15.4*  RBC 4.50 4.17*  HGB 11.1* 10.2*  HCT 38.7* 36.3*  MCV 86.0 87.1  MCH 24.7* 24.5*  MCHC 28.7* 28.1*  RDW 17.6* 18.3*  PLT 295 260   BNPNo results for input(s): BNP, PROBNP in the last 168 hours.  DDimer No results for input(s): DDIMER in the last 168 hours.   Radiology  ECHOCARDIOGRAM COMPLETE  Result Date: 08/21/2020    ECHOCARDIOGRAM REPORT   Patient Name:   John [name] Date of Exam: 08/21/2020 Medical Rec #:  [id number]        Height:       66.0 in Accession #:    [id number]       Weight:       164.6 lb Date of Birth:  04/17/1945        BSA:          1.841 m Patient Age:    75 years         BP:           118/61 mmHg Patient Gender: M                HR:           87 bpm. Exam Location:  Inpatient Procedure: 2D Echo STAT ECHO                                 MODIFIED REPORT:  This report was modified by Rudean Haskell MD on 08/21/2020 due to Added                                   comparison.  Indications:     atrial flutter  History:         Patient has prior history of Echocardiogram examinations, most                  recent 01/15/2018. CHF, CAD, COPD, Arrythmias:Atrial Flutter;                  Risk Factors:Diabetes, Sleep Apnea, Current Smoker,                  Hypertension and Dyslipidemia.  Sonographer:     Johny Chess Referring Phys:  [id number] Margie Billet Diagnosing Phys: Rudean Haskell MD IMPRESSIONS  1. Left ventricular  ejection fraction, by estimation, is 60 to 65%. The left ventricle has normal function. The left ventricle has no regional wall motion abnormalities. Left ventricular diastolic parameters are indeterminate.  2. Right ventricular systolic function is mildly reduced. The right ventricular size is normal.  3. The mitral valve is abnormal. No evidence of mitral valve regurgitation. The mean mitral valve gradient is 4.0 mmHg with average heart rate of 87 bpm.  Moderate mitral annular calcification.  4. The aortic valve is calcified. Aortic valve regurgitation is not visualized. Mild to moderate aortic valve sclerosis/calcification is present. Aortic valve area, by VTI measures 0.91 cm. Aortic valve mean gradient measures 8.0 mmHg. Aortic valve Vmax measures 1.88 m/s. Aortic valve acceleration time measures 97 msec. There is discordance between the AVA and gradients; DVI is 0.36 with LV stroke volume index of 30; Aortic stenosis may be underestimated in this study. Consider outpatient monitoring if clinically indicated. Comparison(s): A prior study was performed on 01/15/2018. Prior images reviewed side by side. Similar peak aortic gradients. FINDINGS  Left Ventricle: Left ventricular ejection fraction, by estimation, is 60 to 65%. The left ventricle has normal function. The left ventricle has no regional wall motion abnormalities. The left ventricular internal cavity size was normal in size. There is  no left ventricular hypertrophy. Left ventricular diastolic parameters are indeterminate. Right Ventricle: The right ventricular size is normal. No increase in right ventricular wall thickness. Right ventricular systolic function is mildly reduced. Left Atrium: Left atrial size was normal in size. Right Atrium: Right atrial size was normal in size. Pericardium: There is no evidence of pericardial effusion. Mitral Valve: The mitral valve is abnormal. There is moderate thickening of the mitral valve leaflet(s). There is moderate calcification of the mitral valve leaflet(s). Moderate mitral annular calcification. No evidence of mitral valve regurgitation. MV peak gradient, 14.0 mmHg. The mean mitral valve gradient is 4.0 mmHg with average heart rate of 87 bpm. Tricuspid Valve: The tricuspid valve is normal in structure. Tricuspid valve regurgitation is not demonstrated. No evidence of tricuspid stenosis. Aortic Valve: The aortic valve is calcified. Aortic valve regurgitation  is not visualized. Mild to moderate aortic valve sclerosis/calcification is present, without any evidence of aortic stenosis. Aortic valve mean gradient measures 8.0 mmHg. Aortic valve peak gradient measures 14.1 mmHg. Aortic valve area, by VTI measures 0.91 cm. Pulmonic Valve: The pulmonic valve was not well visualized. Pulmonic valve regurgitation is not visualized. No evidence of pulmonic stenosis. Aorta: The aortic root is normal in size and structure. IAS/Shunts: The atrial septum is grossly normal.  LEFT VENTRICLE PLAX 2D LVIDd:         4.30 cm  Diastology LVIDs:         2.70 cm  LV e' medial:    8.05 cm/s LV PW:         1.00 cm  LV E/e' medial:  19.4 LV IVS:        0.90 cm  LV e' lateral:   9.68 cm/s LVOT diam:     1.80 cm  LV E/e' lateral: 16.1 LV SV:         30 LV SV Index:   16 LVOT Area:     2.54 cm  RIGHT VENTRICLE            IVC RV S prime:     7.40 cm/s  IVC diam: 2.50 cm LEFT ATRIUM             Index  RIGHT ATRIUM           Index LA diam:        4.20 cm 2.28 cm/m  RA Area:     14.70 cm LA Vol (A2C):   39.7 ml 21.57 ml/m RA Volume:   35.90 ml  19.50 ml/m LA Vol (A4C):   52.0 ml 28.25 ml/m LA Biplane Vol: 47.9 ml 26.02 ml/m  AORTIC VALVE AV Area (Vmax):    0.96 cm AV Area (Vmean):   0.91 cm AV Area (VTI):     0.91 cm AV Vmax:           188.00 cm/s AV Vmean:          128.500 cm/s AV VTI:            0.328 m AV Peak Grad:      14.1 mmHg AV Mean Grad:      8.0 mmHg LVOT Vmax:         71.00 cm/s LVOT Vmean:        46.000 cm/s LVOT VTI:          0.118 m LVOT/AV VTI ratio: 0.36 MITRAL VALVE MV Area (PHT): 6.83 cm     SHUNTS MV Area VTI:   1.07 cm     Systemic VTI:  0.12 m MV Peak grad:  14.0 mmHg    Systemic Diam: 1.80 cm MV Mean grad:  4.0 mmHg MV Vmax:       1.87 m/s MV Vmean:      82.7 cm/s MV Decel Time: 111 msec MV E velocity: 156.00 cm/s MV A velocity: 51.40 cm/s MV E/A ratio:  3.04 Rudean Haskell MD Electronically signed by Rudean Haskell MD Signature Date/Time:  08/21/2020/5:40:15 PM    Final (Updated)     Cardiac Studies  TTE 08/21/2020 1. Left ventricular ejection fraction, by estimation, is 60 to 65%. The  left ventricle has normal function. The left ventricle has no regional  wall motion abnormalities. Left ventricular diastolic parameters are  indeterminate.  2. Right ventricular systolic function is mildly reduced. The right  ventricular size is normal.  3. The mitral valve is abnormal. No evidence of mitral valve  regurgitation. The mean mitral valve gradient is 4.0 mmHg with average  heart rate of 87 bpm. Moderate mitral annular calcification.  4. The aortic valve is calcified. Aortic valve regurgitation is not  visualized. Mild to moderate aortic valve sclerosis/calcification is  present. Aortic valve area, by VTI measures 0.91 cm. Aortic valve mean  gradient measures 8.0 mmHg. Aortic valve  Vmax measures 1.88 m/s. Aortic valve acceleration time measures 97 msec.  There is discordance between the AVA and gradients; DVI is 0.36 with LV  stroke volume index of 30; Aortic stenosis may be underestimated in this  study. Consider outpatient  monitoring if clinically indicated.   Patient Profile  75 year old male with history of CAD (coronary calcifications on CT, no ischemia on recent stress test), COPD, current tobacco abuse, non-small cell lung cancer with recurrence, OSA, PAD who was admitted on 08/20/2020 for right femoral endarterectomy.  He was noted to be in atrial flutter after his procedure and cardiology was consulted.   Assessment & Plan   1.  New onset atrial flutter -Thyroid studies normal.  Echocardiogram with normal LV function.  Unclear timing of flutter. -I have recommended to be discharged home on Eliquis.  Vascular surgery okay with his Plavix and Eliquis.  I have also recommended rate control metoprolol succinate 37.5 mg  daily.  He was taking this as needed at home.  Please ensure he takes his daily. -Given his recent  vascular procedure and rate controlled flutter that does not bother him I recommended he see me in the outpatient setting for outpatient elective cardioversion.  He is ready go home.  No need to delay his discharge today. -I will have him see me in 2 to 3 weeks in the office.  [facility] will sign off.   Medication Recommendations: Eliquis 5 mg twice daily, metoprolol succinate 37.5 mg daily Other recommendations (labs, testing, etc): None Follow up as an outpatient: He will see me in the outpatient setting for elective cardioversion.  I will see him in the office in 2 to 3 weeks.  For questions or updates, please contact [place] Please consult [email or web address] for contact info under   Time Spent with Patient: I have spent a total of 25 minutes with patient reviewing hospital notes, telemetry, EKGs, labs and examining the patient as well as establishing an assessment and plan that was discussed with the patient.  > 50% of time was spent in direct patient care.    Signed, Addison Naegeli. Audie Box, MD, [place]  08/22/2020 9:04 AM

## 2020-08-23 ENCOUNTER — Encounter: Payer: Self-pay | Admitting: Radiation Oncology

## 2020-08-23 ENCOUNTER — Ambulatory Visit: Payer: Medicare Other | Admitting: Family

## 2020-08-23 ENCOUNTER — Ambulatory Visit
Admission: RE | Admit: 2020-08-23 | Discharge: 2020-08-23 | Disposition: A | Discharge status: Home / Self Care | Payer: Medicare Other | Source: Ambulatory Visit | Attending: Radiation Oncology | Admitting: Radiation Oncology

## 2020-08-23 ENCOUNTER — Other Ambulatory Visit: Payer: Self-pay

## 2020-08-23 VITALS — BP 105/46 | HR 88 | Temp 96.8°F | Resp 18 | Ht 66.0 in | Wt 165.2 lb

## 2020-08-23 DIAGNOSIS — E039 Hypothyroidism, unspecified: Secondary | ICD-10-CM | POA: Diagnosis not present

## 2020-08-23 DIAGNOSIS — F17210 Nicotine dependence, cigarettes, uncomplicated: Secondary | ICD-10-CM | POA: Insufficient documentation

## 2020-08-23 DIAGNOSIS — E119 Type 2 diabetes mellitus without complications: Secondary | ICD-10-CM | POA: Diagnosis not present

## 2020-08-23 DIAGNOSIS — C3412 Malignant neoplasm of upper lobe, left bronchus or lung: Secondary | ICD-10-CM | POA: Insufficient documentation

## 2020-08-23 DIAGNOSIS — N400 Enlarged prostate without lower urinary tract symptoms: Secondary | ICD-10-CM | POA: Diagnosis not present

## 2020-08-23 DIAGNOSIS — I509 Heart failure, unspecified: Secondary | ICD-10-CM | POA: Insufficient documentation

## 2020-08-23 DIAGNOSIS — Z7901 Long term (current) use of anticoagulants: Secondary | ICD-10-CM | POA: Diagnosis not present

## 2020-08-23 DIAGNOSIS — I6523 Occlusion and stenosis of bilateral carotid arteries: Secondary | ICD-10-CM | POA: Diagnosis not present

## 2020-08-23 DIAGNOSIS — Z791 Long term (current) use of non-steroidal anti-inflammatories (NSAID): Secondary | ICD-10-CM | POA: Insufficient documentation

## 2020-08-23 DIAGNOSIS — E785 Hyperlipidemia, unspecified: Secondary | ICD-10-CM | POA: Diagnosis not present

## 2020-08-23 DIAGNOSIS — Z87891 Personal history of nicotine dependence: Secondary | ICD-10-CM | POA: Diagnosis not present

## 2020-08-23 DIAGNOSIS — J432 Centrilobular emphysema: Secondary | ICD-10-CM | POA: Diagnosis not present

## 2020-08-23 DIAGNOSIS — C3491 Malignant neoplasm of unspecified part of right bronchus or lung: Secondary | ICD-10-CM

## 2020-08-23 DIAGNOSIS — Z79899 Other long term (current) drug therapy: Secondary | ICD-10-CM | POA: Diagnosis not present

## 2020-08-23 DIAGNOSIS — I10 Essential (primary) hypertension: Secondary | ICD-10-CM | POA: Insufficient documentation

## 2020-08-23 DIAGNOSIS — Z7982 Long term (current) use of aspirin: Secondary | ICD-10-CM | POA: Insufficient documentation

## 2020-08-23 DIAGNOSIS — I2510 Atherosclerotic heart disease of native coronary artery without angina pectoris: Secondary | ICD-10-CM | POA: Insufficient documentation

## 2020-08-23 DIAGNOSIS — Z923 Personal history of irradiation: Secondary | ICD-10-CM | POA: Insufficient documentation

## 2020-08-23 DIAGNOSIS — Z7984 Long term (current) use of oral hypoglycemic drugs: Secondary | ICD-10-CM | POA: Diagnosis not present

## 2020-08-23 DIAGNOSIS — C3411 Malignant neoplasm of upper lobe, right bronchus or lung: Secondary | ICD-10-CM | POA: Diagnosis not present

## 2020-08-23 HISTORY — DX: Personal history of irradiation: Z92.3

## 2020-08-23 NOTE — Progress Notes (Signed)
See MD note for nursing evaluation. °

## 2020-08-23 NOTE — Progress Notes (Signed)
Radiation Oncology         [phone] ________________________________  Initial Outpatient Consultation  Name: John [name] MRN: [id number]  Date: 08/23/2020  DOB: Oct 04, 1945  IO:NGEXB, [name], FNP  Derek Jack, MD   REFERRING PHYSICIAN: Derek Jack, MD  DIAGNOSIS: The encounter diagnosis was Non-small cell lung cancer, right ([place]).  Stage IIIa (T1b, N2, cM0) non-small cell carcinoma, now with ienlarging o bilateral upper lobe nodules  HISTORY OF PRESENT ILLNESS::John [name] is a 75 y.o. male who is seen as a courtesy of Dr. Delton Coombes for an opinion concerning radiation therapy as part of management for his lung cancer. Today, he is accompanied by significant other. The patient's lung cancer history dates back to 04/17/2016 when a chest CT scan revealed bilateral upper lobe pulmonary lesions that were suspicious for malignancy. There were also noted to be borderline enlarged paratracheal lymph nodes and right hilar nodes. Metastatic adenopathy could not be ruled out.  PET scan on 04/24/2016 revealed adjacent hypermetabolic nodules in the right upper lobe consistent with bronchogenic carcinoma. There was also suspicion of ipsilateral hilar and paratracheal nodal metastasis. A nodule in the left upper lobe was also noted to have suspicious morphology with relatively low metabolic activity for size. Synchronous primary bronchogenic carcinoma was favored. There was no metabolic activity associated with the small left lower lobe pulmonary nodule.  MRI of the brain on 05/06/2016 was negative for metastatic disease.  The patient underwent an electromagnetic navigational bronchoscopy on 05/28/2016 under the care of Dr. [name]. Cytology from the procedure revealed blood and benign bronchial and lung tissue of bilateral upper lobes. He underwent a transbronchial needle aspiration of the right upper lobe on that same day that revealed malignant cells consistent with  non-small cell carcinoma.  Given the above findings, the patient underwent six cycles of chemotherapy with Aloxi from 06/25/2016 to 08/15/2016. He also underwent concurrent radiation therapy from 06/25/2016 to 08/20/2016 under the care of Dr. Quitman Livings in [place], [place].  PET scan on 09/18/2016 revealed a partial metabolic response, as the right upper lobe hypermetabolic pulmonary nodule was decreased in size and metabolism. The mildly hypermetabolic right hilar nodal metastasis had also decreased in metabolism and the right paratracheal lymph node was no longer hypermetabolic. There was no new or progressive metastatic disease. Furthermore, the apical left upper lobe 0.9 cm solid pulmonary nodule was slightly decreased in size with no significant metabolism. Additional sub-centimeter pulmonary nodules in the left lung were stable and below PET resolution.   CT scan of chest on 01/13/2017 showed no change in size of the hypermetabolic nodular thickening in the right upper lobe. The left upper lobe spiculated nodule was stable and the rounded left lower lobe pulmonary nodule was also stable. There was no evidence of lung progression at that time.  Since the end of treatment, the patient has undergone serial chest CT scans and PET scans. The most recent chest CT scan on 03/23/2020 showed small pulmonary nodules in the lungs bilaterally that demonstrated persistent mild progression compared to prior examinations, concerning for metastatic lesions. Post-radiation changes in the right lung were stable.  PET scan on 08/13/2020 showed further progression of the bilateral upper lobe hypermetabolic pulmonary nodules, metastatic disease vs metachronous primaries. There was no evidence of thoracic nodal or distant metastasis. Additionally, there was hepatic morphology for which mild cirrhosis was suspected and new trace right pleural fluid.  The patient was last seen by Dr. Delton Coombes on 08/15/2020. Given his  performance status, it was  felt that he would benefit from radiation therapy, specifically SBRT directed at bilateral lung lesions.  Patient recently completed stenting of his right common iliac iliac vessel.  Patient reports improvement in his right leg pain/claudication after his procedure.  PREVIOUS RADIATION THERAPY: Yes, we have requested details of his previous radiation therapy.  Non-small cell carcinoma of the right upper lobe 06/25/2016 - 08/20/2016 (Dr. Quitman Livings) 6600 cGy directed at the right lung  PAST MEDICAL HISTORY:  Past Medical History:  Diagnosis Date  . Adenocarcinoma of lung, right ([place]) 04/18/2016  . Anxiety   . Arthritis   . Asthma   . BPH (benign prostatic hyperplasia)    with urinary retention 02/06/20  . CHF (congestive heart failure) ([place])   . COPD (chronic obstructive pulmonary disease) ([place])   . Depression   . Diabetes mellitus without complication (H[facility])    no meds  . Diabetes mellitus, type II ([place])   . DM (diabetes mellitus) ([place]) 07/09/2016  . Dyspnea   . History of kidney stones   . History of radiation therapy 06/25/16-08/20/16   right lung  . Hyperlipidemia   . Hypertension   . Hypertension   . Hypothyroidism   . Macular degeneration   . Neuropathy   . Non-small cell lung cancer, right ([place]) 04/18/2016  . Peripheral vascular disease ([place])   . Prostatitis   . Pulmonary nodule, left 07/16/2016  . Sleep apnea    cpap    PAST SURGICAL HISTORY: Past Surgical History:  Procedure Laterality Date  . ABDOMINAL AORTOGRAM W/LOWER EXTREMITY Left 02/06/2020   Procedure: ABDOMINAL AORTOGRAM W/LOWER EXTREMITY;  Surgeon: Lorretta Harp, MD;  Location: [facility];  Service: Cardiovascular;  Laterality: Left;  . CATARACT EXTRACTION, BILATERAL Bilateral   . ENDARTERECTOMY FEMORAL Right 08/20/2020   Procedure: ENDARTERECTOMY  RIGHT FEMORAL ARTERY;  Surgeon: Marty Heck, MD;  Location: [facility];  Service: Vascular;  Laterality: Right;  .  INSERTION OF ILIAC STENT Right 08/20/2020   Procedure: RETROGRADE INSERTION OF RIGHT ILIAC STENT;  Surgeon: Marty Heck, MD;  Location: [facility];  Service: Vascular;  Laterality: Right;  . INTRAOPERATIVE ARTERIOGRAM Right 08/20/2020   Procedure: INTRA OPERATIVE ARTERIOGRAM ILIAC;  Surgeon: Marty Heck, MD;  Location: [facility];  Service: Vascular;  Laterality: Right;  . PATCH ANGIOPLASTY Right 08/20/2020   Procedure: PATCH ANGIOPLASTY RIGHT FEMORAL ARTERY;  Surgeon: Marty Heck, MD;  Location: [facility];  Service: Vascular;  Laterality: Right;  . PORTACATH PLACEMENT Left 06/13/2016   Procedure: INSERTION PORT-A-CATH;  Surgeon: Aviva Signs, MD;  Location: [facility];  Service: General;  Laterality: Left;  . TRANSURETHRAL RESECTION OF PROSTATE N/A 05/31/2020   Procedure: TRANSURETHRAL RESECTION OF THE PROSTATE (TURP);  Surgeon: Cleon Gustin, MD;  Location: [facility];  Service: Urology;  Laterality: N/A;  . VIDEO BRONCHOSCOPY WITH ENDOBRONCHIAL NAVIGATION N/A 05/28/2016   Procedure: VIDEO BRONCHOSCOPY WITH ENDOBRONCHIAL NAVIGATION;  Surgeon: [name], MD;  Location: [facility];  Service: Thoracic;  Laterality: N/A;  . VIDEO BRONCHOSCOPY WITH ENDOBRONCHIAL ULTRASOUND N/A 05/28/2016   Procedure: VIDEO BRONCHOSCOPY WITH ENDOBRONCHIAL ULTRASOUND;  Surgeon: [name], MD;  Location: [facility];  Service: Thoracic;  Laterality: N/A;    FAMILY HISTORY:  Family History  Problem Relation Age of Onset  . Hypertension Mother   . Diabetes Father   . Heart disease Father   . Stroke Father   . Hypertension Sister     SOCIAL HISTORY:  Social History   Tobacco Use  .  Smoking status: Current Every Day Smoker    Packs/day: 0.50    Years: 55.00    Pack years: 27.50    Types: Cigarettes    Start date: 03/11/1961  . Smokeless tobacco: Never Used  . Tobacco comment: 1/2 pack to 1 pack per day 12/14/19  Vaping Use  . Vaping Use: Never used  Substance Use Topics  . Alcohol use: No   . Drug use: No    ALLERGIES:  Allergies  Allergen Reactions  . Jardiance [Empagliflozin] Other (See Comments)    FEELS SLUGGISH, TIRED  . Lopressor [Metoprolol] Other (See Comments)    Fatigue  . Atorvastatin     Muscle aches - tolerating Pravastatin 40 mg MWF  . Rosuvastatin     Muscle aches - tolerating Pravastatin 40 mg MWF  . Sulfa Antibiotics Swelling    Mouth swelling    MEDICATIONS:  Current Outpatient Medications  Medication Sig Dispense Refill  . albuterol (VENTOLIN HFA) 108 (90 Base) MCG/ACT inhaler Inhale 2 puffs into the lungs every 4 (four) hours as needed for wheezing or shortness of breath. 8 g 6  . apixaban (ELIQUIS) 5 MG TABS tablet Take 1 tablet (5 mg total) by mouth 2 (two) times daily. 60 tablet 3  . Blood Glucose Monitoring Suppl (ONETOUCH VERIO REFLECT) w/Device KIT Use to test blood sugars daily as directed. DX: E11.9 1 kit 1  . cloNIDine (CATAPRES) 0.1 MG tablet TAKE 1 TABLET (0.1 MG TOTAL) BY MOUTH 3 (THREE) TIMES DAILY. 270 tablet 0  . clopidogrel (PLAVIX) 75 MG tablet Take 1 tablet (75 mg total) by mouth daily at 6 (six) AM. 30 tablet 11  . fluticasone (FLONASE) 50 MCG/ACT nasal spray Place 1 spray into both nostrils daily as needed for allergies or rhinitis. (Patient taking differently: Place 1 spray into both nostrils daily.) 18.2 mL 2  . furosemide (LASIX) 20 MG tablet Take 40 mg in AM and 20 mg in afternoon (Patient taking differently: Take 20-40 mg by mouth See admin instructions. Take 40 mg in AM and 20 mg in evening) 180 tablet 2  . gabapentin (NEURONTIN) 300 MG capsule TAKE 1 CAPSULE BY MOUTH THREE TIMES A DAY (Patient taking differently: Take 300 mg by mouth 3 (three) times daily.) 270 capsule 0  . glucose blood (ONETOUCH VERIO) test strip Use to test blood sugars daily as directed. DX: E11.9 100 each 12  . linaclotide (LINZESS) 145 MCG CAPS capsule Take 1 capsule (145 mcg total) by mouth daily. To regulate bowel movements 30 capsule 5  . losartan  (COZAAR) 50 MG tablet Take 1 tablet (50 mg total) by mouth daily. 90 tablet 3  . meloxicam (MOBIC) 7.5 MG tablet Take 1 tablet (7.5 mg total) by mouth daily as needed for pain (back pain). 90 tablet 0  . metFORMIN (GLUCOPHAGE-XR) 500 MG 24 hr tablet Take 1 tablet (500 mg total) by mouth daily with breakfast. 90 tablet 1  . Meth-Hyo-M Bl-Na Phos-Ph Sal (URO-MP) 118 MG CAPS TAKE 1 CAPSULE (118 MG TOTAL) BY MOUTH 2 (TWO) TIMES DAILY AS NEEDED. (Patient taking differently: Take 118 mg by mouth daily as needed (Uninary burning).) 30 capsule 3  . metoprolol succinate (TOPROL-XL) 25 MG 24 hr tablet Take 1.5 tablets (37.5 mg total) by mouth daily. Take with or immediately following a meal.    . ondansetron (ZOFRAN-ODT) 4 MG disintegrating tablet TAKE 1 TABLET BY MOUTH EVERY 8 HOURS AS NEEDED FOR NAUSEA AND VOMITING (Patient taking differently: Take 4 mg by  mouth every 8 (eight) hours as needed for vomiting or nausea.) 20 tablet 0  . OneTouch Delica Lancets 74Q MISC Use to test blood sugars daily as directed. DX: E11.9 100 each 1  . pravastatin (PRAVACHOL) 40 MG tablet TAKE 1 TABLET (40 MG TOTAL) BY MOUTH ON MONDAYS , WEDNESDAYS, AND FRIDAYS AS DIRECTED (Patient taking differently: Take 40 mg by mouth every Monday, Wednesday, and Friday.) 48 tablet 3  . Semaglutide (RYBELSUS) 7 MG TABS Take 7 mg by mouth daily. 90 tablet 1  . temazepam (RESTORIL) 7.5 MG capsule Take 1 capsule (7.5 mg total) by mouth at bedtime as needed for sleep. 30 capsule 1  . aspirin EC 81 MG tablet Take 81 mg by mouth daily. (Patient not taking: Reported on 08/23/2020)    . bacitracin 500 UNIT/GM ointment Apply 1 application topically 2 (two) times daily. (Patient not taking: Reported on 08/23/2020) 15 g 0  . doxycycline (VIBRA-TABS) 100 MG tablet Take 1 tablet (100 mg total) by mouth 2 (two) times daily. (Patient not taking: Reported on 08/23/2020) 20 tablet 0  . dutasteride (AVODART) 0.5 MG capsule TAKE 1 CAPSULE BY MOUTH EVERY DAY (Patient  not taking: Reported on 08/23/2020) 90 capsule 1  . ezetimibe (ZETIA) 10 MG tablet Take 1 tablet (10 mg total) by mouth daily. (Patient not taking: Reported on 08/23/2020) 30 tablet 1  . HYDROcodone-acetaminophen (NORCO) 5-325 MG tablet Take 1 tablet by mouth every 6 (six) hours as needed for moderate pain. (Patient not taking: Reported on 08/23/2020) 20 tablet 0  . levothyroxine (SYNTHROID) 50 MCG tablet Take 1 tablet (50 mcg total) by mouth daily. 30 tablet 2  . polyethylene glycol (MIRALAX / GLYCOLAX) 17 g packet Take 17 g by mouth daily as needed for moderate constipation or severe constipation. (Patient not taking: Reported on 08/23/2020) 14 each 0   No current facility-administered medications for this encounter.    REVIEW OF SYSTEMS:  A 10+ POINT REVIEW OF SYSTEMS WAS OBTAINED including neurology, dermatology, psychiatry, cardiac, respiratory, lymph, extremities, GI, GU, musculoskeletal, constitutional, reproductive, HEENT.  Reports no pain within the chest area significant cough or hemoptysis.  He has supplemental oxygen at home but not using it at the present time.   PHYSICAL EXAM:  height is 5' 6"  (1.676 m) and weight is 165 lb 4 oz (75 kg). His temporal temperature is 96.8 F (36 C) (abnormal). His blood pressure is 105/46 (abnormal) and his pulse is 88. His respiration is 18 and oxygen saturation is 100%.   General: Alert and oriented, in no acute distress HEENT: Head is normocephalic. Extraocular movements are intact. Oropharynx is clear.  Dentures in place Neck: Neck is supple, no palpable cervical or supraclavicular lymphadenopathy. Heart: Regular in rate and rhythm with no murmurs, rubs, or gallops. Chest: Clear to auscultation bilaterally, with no rhonchi, wheezes, or rales. Abdomen: Soft, nontender, nondistended, with no rigidity or guarding. Extremities: No cyanosis or edema. Lymphatics: see Neck Exam Skin: No concerning lesions. Musculoskeletal: symmetric strength and muscle  tone throughout. Neurologic: Cranial nerves II through XII are grossly intact. No obvious focalities. Speech is fluent. Coordination is intact. Psychiatric: Judgment and insight are intact. Affect is appropriate.   ECOG = 1  0 - Asymptomatic (Fully active, able to carry on all predisease activities without restriction)  1 - Symptomatic but completely ambulatory (Restricted in physically strenuous activity but ambulatory and able to carry out work of a light or sedentary nature. For example, light housework, office work)  2 -  Symptomatic, <50% in bed during the day (Ambulatory and capable of all self care but unable to carry out any work activities. Up and about more than 50% of waking hours)  3 - Symptomatic, >50% in bed, but not bedbound (Capable of only limited self-care, confined to bed or chair 50% or more of waking hours)  4 - Bedbound (Completely disabled. Cannot carry on any self-care. Totally confined to bed or chair)  5 - Death   Eustace Pen MM, Creech RH, Tormey DC, et al. [id number]). "Toxicity and response criteria of the [facility]". [place] Oncol. 5 (6): 649-55  LABORATORY DATA:  Lab Results  Component Value Date   WBC 15.4 (H) 08/21/2020   HGB 10.2 (L) 08/21/2020   HCT 36.3 (L) 08/21/2020   MCV 87.1 08/21/2020   PLT 260 08/21/2020   NEUTROABS 13.9 (H) 08/14/2020   Lab Results  Component Value Date   NA 139 08/21/2020   K 4.3 08/21/2020   CL 106 08/21/2020   CO2 26 08/21/2020   GLUCOSE 154 (H) 08/21/2020   CREATININE 1.14 08/21/2020   CALCIUM 8.6 (L) 08/21/2020      RADIOGRAPHY: DG Chest 2 View per protocol  Result Date: 08/15/2020 CLINICAL DATA:  Peripheral vascular disease, preoperative evaluation, COPD, history of lung cancer EXAM: CHEST - 2 VIEW COMPARISON:  06/04/2020, 08/13/2020 FINDINGS: Frontal and lateral views of the chest demonstrates stable left chest wall port tip in the region of the superior vena cava. No acute airspace  disease, effusion, or pneumothorax. There is a 1.2 cm right upper lobe pulmonary nodule corresponding to a metabolically active spiculated nodule on preceding PET scan. Metabolically active left upper lobe pulmonary nodule is faintly visualized in the first anterior intercostal space, measuring 1 cm on this exam. Please refer to PET-CT 08/13/2020 for full description of findings. No acute bony abnormalities. IMPRESSION: 1. Bilateral upper lobe pulmonary nodules consistent with malignancy, fully described on PET CT performed 08/13/2020. Electronically Signed   By: Randa Ngo M.D.   On: 08/15/2020 18:06   NM PET Image Restag (PS) Skull Base To Thigh  Result Date: 08/14/2020 CLINICAL DATA:  Subsequent treatment strategy for non-small-cell lung cancer diagnosed in 2015. Chemotherapy and radiation therapy. EXAM: NUCLEAR MEDICINE PET SKULL BASE TO THIGH TECHNIQUE: 10.1 mCi F-18 FDG was injected intravenously. Full-ring PET imaging was performed from the skull base to thigh after the radiotracer. CT data was obtained and used for attenuation correction and anatomic localization. Fasting blood glucose: 173 mg/dl COMPARISON:  11/21/2019 FINDINGS: Mediastinal blood pool activity: SUV max 2.5 Liver activity: SUV max NA NECK: No areas of abnormal hypermetabolism. Incidental CT findings: Bilateral carotid atherosclerosis. No cervical adenopathy. CHEST: Left apical pulmonary nodule measures 8 mm and a S.U.V. max of 2.3 on 80/3 versus 7 mm and a S.U.V. max of 1.4 on the prior exam. Lateral right upper lobe pulmonary nodule measures 12 mm and a S.U.V. max of 7.9 on 84/3 versus 9 mm and a S.U.V. max of 2.4 on the prior exam. Redemonstration of right paramediastinal and less so perihilar presumably radiation induced architectural distortion with low-level hypermetabolism. Example at a S.U.V. max of 3.1 on 95/3. No thoracic nodal hypermetabolism. Incidental CT findings: Left Port-A-Cath tip at low SVC. Aortic and coronary  artery calcification. New trace right pleural fluid. Centrilobular emphysema. Smaller left apical pulmonary nodules include a 4 mm on 73/3 are felt to be similar. 4 mm left lower lobe pulmonary nodule on 119/3 is unchanged. ABDOMEN/PELVIS:  No abdominopelvic parenchymal or nodal hypermetabolism. Incidental CT findings: Mild left adrenal thickening is unchanged. Low-density right renal lesion is presumably a cyst. Caudate lobe enlargement with subtle irregular hepatic capsule. Extensive colonic diverticulosis. Interval TURP defect. SKELETON: No abnormal marrow activity. Incidental CT findings: Mild bilateral hip osteoarthritis. IMPRESSION: 1. Further progression of bilateral upper lobe hypermetabolic pulmonary nodules, metastatic disease versus metachronous primaries. 2. No evidence of thoracic nodal or distant metastasis. 3. Hepatic morphology for which mild cirrhosis is suspected. Correlate with risk factors. 4. New trace right pleural fluid. 5. Incidental findings, including: Aortic atherosclerosis (ICD10-I70.0), coronary artery atherosclerosis and emphysema (ICD10-J43.9). Electronically Signed   By: Abigail Miyamoto M.D.   On: 08/14/2020 13:40   DG Abd 2 Views  Result Date: 08/15/2020 CLINICAL DATA:  Abdominal pain, lung cancer EXAM: ABDOMEN - 2 VIEW COMPARISON:  12/30/2019, 08/13/2020 FINDINGS: Supine and upright frontal views of the abdomen and pelvis are obtained. The bowel gas pattern is unremarkable without obstruction or ileus. There are no masses or abnormal calcifications. Stable diffuse lumbar spondylosis. No acute bony abnormalities. IMPRESSION: 1. Unremarkable bowel gas pattern. Electronically Signed   By: Randa Ngo M.D.   On: 08/15/2020 18:07   ECHOCARDIOGRAM COMPLETE  Result Date: 08/21/2020    ECHOCARDIOGRAM REPORT   Patient Name:   DELMUS WARWICK Date of Exam: 08/21/2020 Medical Rec #:  [id number]        Height:       66.0 in Accession #:    [id number]       Weight:       164.6 lb Date of  Birth:  1945/11/11        BSA:          1.841 m Patient Age:    89 years         BP:           118/61 mmHg Patient Gender: M                HR:           87 bpm. Exam Location:  Inpatient Procedure: 2D Echo STAT ECHO                                 MODIFIED REPORT:  This report was modified by Rudean Haskell MD on 08/21/2020 due to Added                                   comparison.  Indications:     atrial flutter  History:         Patient has prior history of Echocardiogram examinations, most                  recent 01/15/2018. CHF, CAD, COPD, Arrythmias:Atrial Flutter;                  Risk Factors:Diabetes, Sleep Apnea, Current Smoker,                  Hypertension and Dyslipidemia.  Sonographer:     Johny Chess Referring Phys:  [id number] Margie Billet Diagnosing Phys: Rudean Haskell MD IMPRESSIONS  1. Left ventricular ejection fraction, by estimation, is 60 to 65%. The left ventricle has normal function. The left ventricle has no regional wall motion abnormalities. Left ventricular diastolic parameters are indeterminate.  2. Right ventricular systolic function  is mildly reduced. The right ventricular size is normal.  3. The mitral valve is abnormal. No evidence of mitral valve regurgitation. The mean mitral valve gradient is 4.0 mmHg with average heart rate of 87 bpm. Moderate mitral annular calcification.  4. The aortic valve is calcified. Aortic valve regurgitation is not visualized. Mild to moderate aortic valve sclerosis/calcification is present. Aortic valve area, by VTI measures 0.91 cm. Aortic valve mean gradient measures 8.0 mmHg. Aortic valve Vmax measures 1.88 m/s. Aortic valve acceleration time measures 97 msec. There is discordance between the AVA and gradients; DVI is 0.36 with LV stroke volume index of 30; Aortic stenosis may be underestimated in this study. Consider outpatient monitoring if clinically indicated. Comparison(s): A prior study was performed on 01/15/2018. Prior images  reviewed side by side. Similar peak aortic gradients. FINDINGS  Left Ventricle: Left ventricular ejection fraction, by estimation, is 60 to 65%. The left ventricle has normal function. The left ventricle has no regional wall motion abnormalities. The left ventricular internal cavity size was normal in size. There is  no left ventricular hypertrophy. Left ventricular diastolic parameters are indeterminate. Right Ventricle: The right ventricular size is normal. No increase in right ventricular wall thickness. Right ventricular systolic function is mildly reduced. Left Atrium: Left atrial size was normal in size. Right Atrium: Right atrial size was normal in size. Pericardium: There is no evidence of pericardial effusion. Mitral Valve: The mitral valve is abnormal. There is moderate thickening of the mitral valve leaflet(s). There is moderate calcification of the mitral valve leaflet(s). Moderate mitral annular calcification. No evidence of mitral valve regurgitation. MV peak gradient, 14.0 mmHg. The mean mitral valve gradient is 4.0 mmHg with average heart rate of 87 bpm. Tricuspid Valve: The tricuspid valve is normal in structure. Tricuspid valve regurgitation is not demonstrated. No evidence of tricuspid stenosis. Aortic Valve: The aortic valve is calcified. Aortic valve regurgitation is not visualized. Mild to moderate aortic valve sclerosis/calcification is present, without any evidence of aortic stenosis. Aortic valve mean gradient measures 8.0 mmHg. Aortic valve peak gradient measures 14.1 mmHg. Aortic valve area, by VTI measures 0.91 cm. Pulmonic Valve: The pulmonic valve was not well visualized. Pulmonic valve regurgitation is not visualized. No evidence of pulmonic stenosis. Aorta: The aortic root is normal in size and structure. IAS/Shunts: The atrial septum is grossly normal.  LEFT VENTRICLE PLAX 2D LVIDd:         4.30 cm  Diastology LVIDs:         2.70 cm  LV e' medial:    8.05 cm/s LV PW:         1.00 cm   LV E/e' medial:  19.4 LV IVS:        0.90 cm  LV e' lateral:   9.68 cm/s LVOT diam:     1.80 cm  LV E/e' lateral: 16.1 LV SV:         30 LV SV Index:   16 LVOT Area:     2.54 cm  RIGHT VENTRICLE            IVC RV S prime:     7.40 cm/s  IVC diam: 2.50 cm LEFT ATRIUM             Index       RIGHT ATRIUM           Index LA diam:        4.20 cm 2.28 cm/m  RA Area:     14.70  cm LA Vol (A2C):   39.7 ml 21.57 ml/m RA Volume:   35.90 ml  19.50 ml/m LA Vol (A4C):   52.0 ml 28.25 ml/m LA Biplane Vol: 47.9 ml 26.02 ml/m  AORTIC VALVE AV Area (Vmax):    0.96 cm AV Area (Vmean):   0.91 cm AV Area (VTI):     0.91 cm AV Vmax:           188.00 cm/s AV Vmean:          128.500 cm/s AV VTI:            0.328 m AV Peak Grad:      14.1 mmHg AV Mean Grad:      8.0 mmHg LVOT Vmax:         71.00 cm/s LVOT Vmean:        46.000 cm/s LVOT VTI:          0.118 m LVOT/AV VTI ratio: 0.36 MITRAL VALVE MV Area (PHT): 6.83 cm     SHUNTS MV Area VTI:   1.07 cm     Systemic VTI:  0.12 m MV Peak grad:  14.0 mmHg    Systemic Diam: 1.80 cm MV Mean grad:  4.0 mmHg MV Vmax:       1.87 m/s MV Vmean:      82.7 cm/s MV Decel Time: 111 msec MV E velocity: 156.00 cm/s MV A velocity: 51.40 cm/s MV E/A ratio:  3.04 Rudean Haskell MD Electronically signed by Rudean Haskell MD Signature Date/Time: 08/21/2020/5:40:15 PM    Final (Updated)    HYBRID OR IMAGING ([facility])  Result Date: 08/20/2020 There is no interpretation for this exam.  This order is for images obtained during a surgical procedure.  Please See "Surgeries" Tab for more information regarding the procedure.      IMPRESSION: Prior history of stage IIIa (T1b, N2, cM0) non-small cell carcinoma, now with enlarging  Bilateral PET positive upper lobe nodules  The patient will be a good candidate for stereotactic body radiation therapy directed at these PET positive bilateral upper lobe nodules.  These could be oligometastatic disease from his stage IIIa non-small cell lung  cancer or could be metachronous stage I non-small cell lung cancers.  Either way would be a good candidate for SBRT with good probability of success in treating these 2 areas.  Today, I talked to the patient and significant other, girlfriend of many years about the findings and work-up thus far.  We discussed the natural history of lung cancer and general treatment, highlighting the role of radiotherapy in the management.  We discussed the available radiation techniques, and focused on the details of logistics and delivery.  We reviewed the anticipated acute and late sequelae associated with radiation in this setting.  The patient was encouraged to ask questions that I answered to the best of my ability.  A patient consent form was discussed and signed.  We retained a copy for our records.  The patient would like to proceed with radiation and will be scheduled for CT simulation.  PLAN: Patient will return next week for SBRT simulation with treatments to begin approximately a week later.  Anticipate 3 SBRT T treatments directed at both lesions.  Total time spent in this encounter was 60 minutes which included reviewing the patient's most recent chest CT scans, PET scans, bronchoscopy/biopsy, pathology/cytology reports, consultations, follow-ups, chemotherapy, radiation therapy, physical examination, and documentation.   ------------------------------------------------  Blair Promise, PhD, MD  This document serves  as a record of services personally performed by Gery Pray, MD. It was created on his behalf by Clerance Lav, a trained medical scribe. The creation of this record is based on the scribe's personal observations and the provider's statements to them. This document has been checked and approved by the attending provider.

## 2020-08-23 NOTE — Telephone Encounter (Signed)
Patient called. Left message to continue medication.

## 2020-08-24 ENCOUNTER — Telehealth: Payer: Self-pay | Admitting: Cardiovascular Disease

## 2020-08-24 ENCOUNTER — Telehealth: Payer: Self-pay | Admitting: *Deleted

## 2020-08-24 DIAGNOSIS — Z48812 Encounter for surgical aftercare following surgery on the circulatory system: Secondary | ICD-10-CM | POA: Diagnosis not present

## 2020-08-24 DIAGNOSIS — Z95820 Peripheral vascular angioplasty status with implants and grafts: Secondary | ICD-10-CM | POA: Diagnosis not present

## 2020-08-24 DIAGNOSIS — E1151 Type 2 diabetes mellitus with diabetic peripheral angiopathy without gangrene: Secondary | ICD-10-CM | POA: Diagnosis not present

## 2020-08-24 DIAGNOSIS — I4892 Unspecified atrial flutter: Secondary | ICD-10-CM | POA: Diagnosis not present

## 2020-08-24 DIAGNOSIS — E1165 Type 2 diabetes mellitus with hyperglycemia: Secondary | ICD-10-CM | POA: Diagnosis not present

## 2020-08-24 DIAGNOSIS — J9611 Chronic respiratory failure with hypoxia: Secondary | ICD-10-CM | POA: Diagnosis not present

## 2020-08-24 DIAGNOSIS — J449 Chronic obstructive pulmonary disease, unspecified: Secondary | ICD-10-CM | POA: Diagnosis not present

## 2020-08-24 DIAGNOSIS — I509 Heart failure, unspecified: Secondary | ICD-10-CM | POA: Diagnosis not present

## 2020-08-24 DIAGNOSIS — I110 Hypertensive heart disease with heart failure: Secondary | ICD-10-CM | POA: Diagnosis not present

## 2020-08-24 DIAGNOSIS — N401 Enlarged prostate with lower urinary tract symptoms: Secondary | ICD-10-CM | POA: Diagnosis not present

## 2020-08-24 NOTE — Telephone Encounter (Signed)
Patient called c/o "swelling in my private area" since right femoral  Endarterectomy 08/20/2020.  Patient denies fever or other post op complications. I instructed patient to elevate his lower extremities above the level of his heart and to place a rolled wash cloth to support his scrotum area and to apply ice.  Patient acknowledged understanding of the instructions and will call the office if worsening symptoms should occur.

## 2020-08-24 NOTE — Telephone Encounter (Signed)
None of his medications will cause a red or swollen tongue.   He just had a very big vascular surgery. I suspect his fatigue is from being in the hospital.   I would continue current medications and see me in follow-up.   [place]ake Bells T. Audie Box, MD, [place]  [address], [place], [place] [address] [phone]  10:18 AM

## 2020-08-24 NOTE — Telephone Encounter (Signed)
Not sure what throat spray to recommend.   Stay on course with meds and keep BP/HR log.   Lake Bells T. Audie Box, MD, [place]  [address] [place],  [address] [phone]  12:27 PM

## 2020-08-24 NOTE — Telephone Encounter (Signed)
Patient states the issue started yesterday - tongue is red and when he its something it burns his tongue.  no difficulty breathing or swelling of the tongue patient states.  Patient also states he fells  "dragged down"  Had difficulty walking- tired ,fatigue legs aching and hips.  all occurring yesterday. Patient states she does not have the problem with the fatigue so far this morning.  He states he only took Clopidogrel this morning. He usually take the Metoprolol and Eliquis @ 9 or 10 am in the morning and other dose of Eliquis at bedtime.  Patient has not not started treatment for  Non small lung cancer yet . He did see the oncologist nurse yesterday but did not mention this to her.  Patient also stated he had been on Metoprolol in the past and his primary had to reduce to half the dose because he felt fatigue. Patient states it was increased to 37.5 mg  While in the hospital   RN informed patient not to take the morning dose of Metoprolol  Until office contact him back

## 2020-08-24 NOTE — Telephone Encounter (Signed)
Pt c/o medication issue:  1. Name of Medication: clopidogrel (PLAVIX) 75 MG tablet apixaban (ELIQUIS) 5 MG TABS tablet metoprolol succinate (TOPROL-XL) 25 MG 24 hr tablet  2. How are you currently taking this medication (dosage and times per day)? As prescribed  3. Are you having a reaction (difficulty breathing--STAT)?red tongue and swelling  4. What is your medication issue? Pt is calling with concerns about a lot his medications.He states he is allergic to sulfur and thinks his medications are causing him to have to have a burning red tongue.

## 2020-08-24 NOTE — Telephone Encounter (Signed)
Called patient, advised of message from MD and PharmD. Patient advised to contact PCP in regards to the tongue, he has had this issue before and they gave him a rinse that helped.   Patient states he was decreased on his Metoprolol dose prior to hospital stay by Bernerd Pho, PA-C- who suggested 12.5 mg daily- and then if HR/BP dropped to stop taking it. He states prior to hospital he stopped taking it. He has since been restarted back to the 37.5 mg daily. Home health nurse comes and he was advised if the BP/HR was low to let us know- he was also advised to keep a log and to call with concerns and bring to upcoming appointment.   Patient verbalized understanding. Thankful for call back.  Will route as an update.

## 2020-08-24 NOTE — Telephone Encounter (Signed)
Please confirm the dose of metoprolol patient was taking prior to admission. The chart says it was 37.5mg . What is his heart rate? Fatigue with beta blockers typically resolves within 2 weeks.   I do not think his tongue issue is medication related. I will also wait for Dr. Audie Box opionion

## 2020-08-28 ENCOUNTER — Telehealth: Payer: Self-pay | Admitting: Cardiovascular Disease

## 2020-08-28 ENCOUNTER — Telehealth: Payer: Self-pay | Admitting: Cardiology

## 2020-08-28 ENCOUNTER — Telehealth: Payer: Self-pay | Admitting: *Deleted

## 2020-08-28 NOTE — Telephone Encounter (Signed)
Probably best. I should be seeing him soon too.   Lake Bells T. Audie Box, MD, [place]  [address] [place], [place] [address] [phone]  7:33 PM

## 2020-08-28 NOTE — Telephone Encounter (Signed)
Pt c/o swelling: STAT is pt has developed SOB within 24 hours  1) How much weight have you gained and in what time span? No   2) If swelling, where is the swelling located?  Testicles   3) Are you currently taking a fluid pill? Yes   4) Are you currently SOB? No   5) Do you have a log of your daily weights (if so, list)? No   6) Have you gained 3 pounds in a day or 5 pounds in a week? Not sure  7) Have you traveled recently? No    Patient states he can not sit down patient. Patient was in the hospital and Dr. Jenetta Downer' Nori Riis changed some medication

## 2020-08-28 NOTE — Telephone Encounter (Signed)
Pt with enlarged scrotum, so large he cannot sit down, + painful.  Not discolored.  He feels it began with plavix and eliquis.  He did have procedure with Dr. Carlis Abbott  And has talked with Dr. Carlis Abbott and will see tomorrow.  Dr. Audie Box had suggested, extra lasix.  He tells me his BP is on lower end.  He does have pain.  I recommended being seen at an urgent care of ER.  Could be an infection, could be bleeding with eliquis and plavix.  He is agreeable.

## 2020-08-28 NOTE — Telephone Encounter (Signed)
Pt called to report scrotal swelling that has not improved over several days. He reports that he has trouble sitting down to use the bathroom. He is s/p fem endarterectomy. Pt reports surgical incision is very red and irritated. Put pt on the PA schedule for a wound tomorrow and encouraged him to contact his PCP about the swelling.

## 2020-08-28 NOTE — Telephone Encounter (Signed)
Spoke with pt, he is swelling in his feet, no more than usual, but his testicles are double in size by his report and he can no sit down. He has called Dr Carlis Abbott and they do not feel it is related to his recent procedure but he has an appointment with them tomorrow. The patient feels it is related to either, eliquis, clopidogrel or metoprolol causing the problem. He is not SOB. He will take an extra furosemide this evening to see if that will help. Will forward to dr Audie Box to review and advise

## 2020-08-28 NOTE — Chronic Care Management (AMB) (Signed)
  Chronic Care Management   Note  08/28/2020 Name: John [name] MRN: [id number] DOB: 1945-12-29  John [name] is a 75 y.o. year old male who is a primary care patient of [name], FNP. I reached out to Frederica Kuster by phone today in response to a referral sent by [name]'s PCP, [name], FNP.     Mr. Saladin was given information about Chronic Care Management services today including:  1. CCM service includes personalized support from designated clinical staff supervised by his physician, including individualized plan of care and coordination with other care providers 2. 24/7 contact phone numbers for assistance for urgent and routine care needs. 3. Service will only be billed when office clinical staff spend 20 minutes or more in a month to coordinate care. 4. Only one practitioner may furnish and bill the service in a calendar month. 5. The patient may stop CCM services at any time (effective at the end of the month) by phone call to the office staff. 6. The patient will be responsible for cost sharing (co-pay) of up to 20% of the service fee (after annual deductible is met).  Patient agreed to services and verbal consent obtained.   Follow up plan: Telephone appointment with care management team member scheduled for:09/10/2020  [facility]

## 2020-08-29 ENCOUNTER — Ambulatory Visit (INDEPENDENT_AMBULATORY_CARE_PROVIDER_SITE_OTHER): Payer: Medicare Other | Admitting: Physician Assistant

## 2020-08-29 ENCOUNTER — Ambulatory Visit: Payer: Medicare Other | Admitting: Nurse Practitioner

## 2020-08-29 ENCOUNTER — Other Ambulatory Visit: Payer: Self-pay

## 2020-08-29 ENCOUNTER — Ambulatory Visit
Admission: RE | Admit: 2020-08-29 | Discharge: 2020-08-29 | Disposition: A | Discharge status: Home / Self Care | Payer: Medicare Other | Source: Ambulatory Visit | Attending: Radiation Oncology | Admitting: Radiation Oncology

## 2020-08-29 ENCOUNTER — Encounter: Payer: Self-pay | Admitting: Physician Assistant

## 2020-08-29 VITALS — BP 118/59 | HR 81 | Temp 97.5°F | Resp 20 | Ht 66.0 in | Wt 162.0 lb

## 2020-08-29 DIAGNOSIS — C3412 Malignant neoplasm of upper lobe, left bronchus or lung: Secondary | ICD-10-CM | POA: Diagnosis not present

## 2020-08-29 DIAGNOSIS — C3491 Malignant neoplasm of unspecified part of right bronchus or lung: Secondary | ICD-10-CM

## 2020-08-29 DIAGNOSIS — I739 Peripheral vascular disease, unspecified: Secondary | ICD-10-CM

## 2020-08-29 DIAGNOSIS — C3411 Malignant neoplasm of upper lobe, right bronchus or lung: Secondary | ICD-10-CM | POA: Insufficient documentation

## 2020-08-29 DIAGNOSIS — Z87891 Personal history of nicotine dependence: Secondary | ICD-10-CM | POA: Diagnosis not present

## 2020-08-29 NOTE — Progress Notes (Signed)
POST OPERATIVE OFFICE NOTE    CC:  F/u for surgery  HPI:  This is a 75 y.o. male who is s/p right femoral endarterectomy with retrograde right common iliac artery angioplasty with stent placement and IVUS for right lower extremity short distance lifestyle limiting claudication   on 08/20/20 by Dr. Carlis Abbott.   -given Eliquis was started, will stop asa and continue plavix in light of iliac stent.   There are plans for possible outpatient cardioversion.   He also has a diagnosis was Non-small cell lung cancer, right ([place]).  Stage IIIa (T1b, N2, cM0) non-small cell carcinoma, now with ienlarging o bilateral upper lobe nodules  Pt returns today for follow up.  Pt states he has developed scot al edema with itching since Friday.  He denise symptoms of right groin pain, drainage or edema.  He denise symptoms of claudication, rest pain or non healing wounds.    Allergies  Allergen Reactions  . Jardiance [Empagliflozin] Other (See Comments)    FEELS SLUGGISH, TIRED  . Lopressor [Metoprolol] Other (See Comments)    Fatigue  . Atorvastatin     Muscle aches - tolerating Pravastatin 40 mg MWF  . Rosuvastatin     Muscle aches - tolerating Pravastatin 40 mg MWF  . Sulfa Antibiotics Swelling    Mouth swelling    Current Outpatient Medications  Medication Sig Dispense Refill  . albuterol (VENTOLIN HFA) 108 (90 Base) MCG/ACT inhaler Inhale 2 puffs into the lungs every 4 (four) hours as needed for wheezing or shortness of breath. 8 g 6  . apixaban (ELIQUIS) 5 MG TABS tablet Take 1 tablet (5 mg total) by mouth 2 (two) times daily. 60 tablet 3  . aspirin EC 81 MG tablet Take 81 mg by mouth daily.    . bacitracin 500 UNIT/GM ointment Apply 1 application topically 2 (two) times daily. 15 g 0  . Blood Glucose Monitoring Suppl (ONETOUCH VERIO REFLECT) w/Device KIT Use to test blood sugars daily as directed. DX: E11.9 1 kit 1  . cloNIDine (CATAPRES) 0.1 MG tablet TAKE 1 TABLET (0.1 MG TOTAL) BY MOUTH 3  (THREE) TIMES DAILY. 270 tablet 0  . clopidogrel (PLAVIX) 75 MG tablet Take 1 tablet (75 mg total) by mouth daily at 6 (six) AM. 30 tablet 11  . doxycycline (VIBRA-TABS) 100 MG tablet Take 1 tablet (100 mg total) by mouth 2 (two) times daily. 20 tablet 0  . dutasteride (AVODART) 0.5 MG capsule TAKE 1 CAPSULE BY MOUTH EVERY DAY 90 capsule 1  . ezetimibe (ZETIA) 10 MG tablet Take 1 tablet (10 mg total) by mouth daily. 30 tablet 1  . fluticasone (FLONASE) 50 MCG/ACT nasal spray Place 1 spray into both nostrils daily as needed for allergies or rhinitis. (Patient taking differently: Place 1 spray into both nostrils daily.) 18.2 mL 2  . furosemide (LASIX) 20 MG tablet Take 40 mg in AM and 20 mg in afternoon (Patient taking differently: Take 20-40 mg by mouth See admin instructions. Take 40 mg in AM and 20 mg in evening) 180 tablet 2  . gabapentin (NEURONTIN) 300 MG capsule TAKE 1 CAPSULE BY MOUTH THREE TIMES A DAY (Patient taking differently: Take 300 mg by mouth 3 (three) times daily.) 270 capsule 0  . glucose blood (ONETOUCH VERIO) test strip Use to test blood sugars daily as directed. DX: E11.9 100 each 12  . HYDROcodone-acetaminophen (NORCO) 5-325 MG tablet Take 1 tablet by mouth every 6 (six) hours as needed for moderate pain. [place]  tablet 0  . linaclotide (LINZESS) 145 MCG CAPS capsule Take 1 capsule (145 mcg total) by mouth daily. To regulate bowel movements 30 capsule 5  . losartan (COZAAR) 50 MG tablet Take 1 tablet (50 mg total) by mouth daily. 90 tablet 3  . meloxicam (MOBIC) 7.5 MG tablet Take 1 tablet (7.5 mg total) by mouth daily as needed for pain (back pain). 90 tablet 0  . metFORMIN (GLUCOPHAGE-XR) 500 MG 24 hr tablet Take 1 tablet (500 mg total) by mouth daily with breakfast. 90 tablet 1  . Meth-Hyo-M Bl-Na Phos-Ph Sal (URO-MP) 118 MG CAPS TAKE 1 CAPSULE (118 MG TOTAL) BY MOUTH 2 (TWO) TIMES DAILY AS NEEDED. (Patient taking differently: Take 118 mg by mouth daily as needed (Uninary burning).)  30 capsule 3  . metoprolol succinate (TOPROL-XL) 25 MG 24 hr tablet Take 1.5 tablets (37.5 mg total) by mouth daily. Take with or immediately following a meal.    . ondansetron (ZOFRAN-ODT) 4 MG disintegrating tablet TAKE 1 TABLET BY MOUTH EVERY 8 HOURS AS NEEDED FOR NAUSEA AND VOMITING (Patient taking differently: Take 4 mg by mouth every 8 (eight) hours as needed for vomiting or nausea.) 20 tablet 0  . OneTouch Delica Lancets 08Q MISC Use to test blood sugars daily as directed. DX: E11.9 100 each 1  . polyethylene glycol (MIRALAX / GLYCOLAX) 17 g packet Take 17 g by mouth daily as needed for moderate constipation or severe constipation. 14 each 0  . pravastatin (PRAVACHOL) 40 MG tablet TAKE 1 TABLET (40 MG TOTAL) BY MOUTH ON MONDAYS , WEDNESDAYS, AND FRIDAYS AS DIRECTED (Patient taking differently: Take 40 mg by mouth every Monday, Wednesday, and Friday.) 48 tablet 3  . Semaglutide (RYBELSUS) 7 MG TABS Take 7 mg by mouth daily. 90 tablet 1  . temazepam (RESTORIL) 7.5 MG capsule Take 1 capsule (7.5 mg total) by mouth at bedtime as needed for sleep. 30 capsule 1  . levothyroxine (SYNTHROID) 50 MCG tablet Take 1 tablet (50 mcg total) by mouth daily. 30 tablet 2   No current facility-administered medications for this visit.     ROS:  See HPI  Physical Exam:    Incision:  Right groin incision is healing well without erythema, edema or drainage.  Pilar Plate scrotal/penile edema.  Groin crease yeast infection. Extremities:  Palpable DP right LE, pitting edema in B LE Heart : RRR Abdomen:  Soft NTTP, +BS   Assessment/Plan:  This is a 75 y.o. male who is s/p right femoral endarterectomy with retrograde right common iliac artery angioplasty with stent placement and IVUS for right lower extremity short distance lifestyle limiting claudication   on 08/20/20 by Dr. Carlis Abbott.     The scrotal edema is his concern.  His symptoms of sever claudication have resolved.  He was started on Eliquis and Plavix post op.   I recommended he see a urologist.  Otherwise he will elevate the scrotum and B LE.  He may require a scrotal duplex.  He now has an appointment with his urologist 09/04/20 at 3pm.     He will f/u with [facility] in 3 months for ABI's     Roxy Horseman PA-C [facility] [phone]   Clinic MD:  [place]

## 2020-08-29 NOTE — Telephone Encounter (Signed)
Please make sure he is not missing any doses of Plavix.  He does had a recent stent placed by vascular surgery.  Also needs to continue on his Eliquis.  I sent a message to Anderson Malta that he just needs to be evaluated emergency room given his ongoing symptoms.  I am not sure what is going on.  Lake Bells T. Audie Box, MD, [place]  [address], [place], [place] [address] [phone]  1:10 PM

## 2020-08-29 NOTE — Telephone Encounter (Signed)
He is having a lot of issues postoperatively.  I would recommend he be evaluated in the emergency room.  Lake Bells T. Audie Box, MD, [place]  [address] [place], [place] [address] [phone]  1:09 PM

## 2020-08-29 NOTE — Telephone Encounter (Signed)
Spoke to patient.   Patient did not og to urgent care or ER last night.  Patient dis go to his appointment to [facility] today. Referral was made for patient to see Urologist on 09/04/20 by  [facility]'s physician assistant.  RN called patient offered an appointment today to see Dr Audie Box at 2:20 pm . Patient decline states he has another appointment today to have some moles removed at th cancer center today at 1:30 pm. Dr Audie Box is aware and states the June 8 appointment is  Appropriate to be seen.     Patient is concerned about his low blood pressure. He is complaining of dizziness . He states todays' 118 /59, in the past as low 97  Systolic.  patient states he recently called the on call person - the instructied him to reduce Losartan to 25 mg daily from 50 mg  Patient is currently taking Metoprolol tartrate 3.5 mg twice a day           Clonidine 0.1 mg three times a day   Patient states he did not take the Clopidogrel 75 mg nor Eliquis 5 mg this morning . He states he was concerned about his swelling of scrotum          Patient aware will defer to Dr Audie Box

## 2020-08-29 NOTE — Telephone Encounter (Signed)
Follow up:     Patient returning the call from yesterday.

## 2020-08-29 NOTE — Telephone Encounter (Signed)
Patient calling back to see what he should do about his blood pressure. Patient states his most recent BP was 118/59. Patient reports he is still having dizziness, and feels lightheaded all the time. Patient takes he only took half a tablet of Losartan (25mg  Total), and his clonidine this morning. Patient states that he has been taking 40mg  of lasix and states that this is not working. Patient would like to know if Dr. Audie Box has any recommendations for him. Advised patient that I would forward message to Dr. Audie Box for him to review and advise. Patient verbalized understanding.   Made patient aware of ED precautions should new or worsening symptoms develop. Patient verbalized understanding.

## 2020-08-29 NOTE — Telephone Encounter (Signed)
Returned call to patient, made patient aware of Dr. Kathalene Frames recommendations and patient verbalized understanding.

## 2020-08-30 ENCOUNTER — Ambulatory Visit (INDEPENDENT_AMBULATORY_CARE_PROVIDER_SITE_OTHER): Payer: Medicare Other | Admitting: Family

## 2020-08-30 ENCOUNTER — Other Ambulatory Visit: Payer: Self-pay

## 2020-08-30 ENCOUNTER — Encounter: Payer: Self-pay | Admitting: Family

## 2020-08-30 VITALS — BP 132/64 | HR 84 | Temp 96.5°F | Ht 66.0 in | Wt 163.0 lb

## 2020-08-30 DIAGNOSIS — I2510 Atherosclerotic heart disease of native coronary artery without angina pectoris: Secondary | ICD-10-CM

## 2020-08-30 DIAGNOSIS — R609 Edema, unspecified: Secondary | ICD-10-CM

## 2020-08-30 DIAGNOSIS — N5089 Other specified disorders of the male genital organs: Secondary | ICD-10-CM | POA: Diagnosis not present

## 2020-08-30 DIAGNOSIS — Z09 Encounter for follow-up examination after completed treatment for conditions other than malignant neoplasm: Secondary | ICD-10-CM | POA: Diagnosis not present

## 2020-08-30 DIAGNOSIS — I739 Peripheral vascular disease, unspecified: Secondary | ICD-10-CM | POA: Diagnosis not present

## 2020-08-30 DIAGNOSIS — R600 Localized edema: Secondary | ICD-10-CM

## 2020-08-30 DIAGNOSIS — R231 Pallor: Secondary | ICD-10-CM | POA: Diagnosis not present

## 2020-08-30 DIAGNOSIS — I4892 Unspecified atrial flutter: Secondary | ICD-10-CM

## 2020-08-30 DIAGNOSIS — I152 Hypertension secondary to endocrine disorders: Secondary | ICD-10-CM | POA: Diagnosis not present

## 2020-08-30 DIAGNOSIS — I509 Heart failure, unspecified: Secondary | ICD-10-CM

## 2020-08-30 DIAGNOSIS — E1159 Type 2 diabetes mellitus with other circulatory complications: Secondary | ICD-10-CM | POA: Diagnosis not present

## 2020-08-30 DIAGNOSIS — Z720 Tobacco use: Secondary | ICD-10-CM

## 2020-08-30 LAB — CBC WITH DIFFERENTIAL/PLATELET
Basophils Absolute: 0.1 10*3/uL (ref 0.0–0.2)
Basos: 1 %
EOS (ABSOLUTE): 0.2 10*3/uL (ref 0.0–0.4)
Eos: 3 %
Hematocrit: 32 % — ABNORMAL LOW (ref 37.5–51.0)
Hemoglobin: 9.4 g/dL — ABNORMAL LOW (ref 13.0–17.7)
Immature Grans (Abs): 0 10*3/uL (ref 0.0–0.1)
Immature Granulocytes: 0 %
Lymphocytes Absolute: 0.7 10*3/uL (ref 0.7–3.1)
Lymphs: 9 %
MCH: 24.2 pg — ABNORMAL LOW (ref 26.6–33.0)
MCHC: 29.4 g/dL — ABNORMAL LOW (ref 31.5–35.7)
MCV: 83 fL (ref 79–97)
Monocytes Absolute: 0.8 10*3/uL (ref 0.1–0.9)
Monocytes: 9 %
Neutrophils Absolute: 6.4 10*3/uL (ref 1.4–7.0)
Neutrophils: 78 %
Platelets: 529 10*3/uL — ABNORMAL HIGH (ref 150–450)
RBC: 3.88 x10E6/uL — ABNORMAL LOW (ref 4.14–5.80)
RDW: 17.4 % — ABNORMAL HIGH (ref 11.6–15.4)
WBC: 8.2 10*3/uL (ref 3.4–10.8)

## 2020-08-30 LAB — MICROSCOPIC EXAMINATION: Epithelial Cells (non renal): NONE SEEN /hpf (ref 0–10)

## 2020-08-30 LAB — BMP8+EGFR
BUN/Creatinine Ratio: 18 (ref 10–24)
BUN: 19 mg/dL (ref 8–27)
CO2: 26 mmol/L (ref 20–29)
Calcium: 9.3 mg/dL (ref 8.6–10.2)
Chloride: 98 mmol/L (ref 96–106)
Creatinine, Ser: 1.04 mg/dL (ref 0.76–1.27)
Glucose: 133 mg/dL — ABNORMAL HIGH (ref 65–99)
Potassium: 4.6 mmol/L (ref 3.5–5.2)
Sodium: 140 mmol/L (ref 134–144)
eGFR: 75 mL/min/{1.73_m2} (ref >59)

## 2020-08-30 LAB — URINALYSIS, COMPLETE
Bilirubin, UA: NEGATIVE
Glucose, UA: NEGATIVE
Ketones, UA: NEGATIVE
Nitrite, UA: NEGATIVE
Specific Gravity, UA: 1.015 (ref 1.005–1.030)
Urobilinogen, Ur: 0.2 mg/dL (ref 0.2–1.0)
pH, UA: 6 (ref 5.0–7.5)

## 2020-08-30 MED ORDER — FUROSEMIDE 20 MG PO TABS: 40.0000 mg | ORAL_TABLET | Freq: Two times a day (BID) | ORAL | 2 refills | Status: DC

## 2020-08-30 MED ORDER — LOSARTAN POTASSIUM 50 MG PO TABS: 25.0000 mg | ORAL_TABLET | Freq: Every day | ORAL | 3 refills | Status: DC

## 2020-08-30 NOTE — Progress Notes (Signed)
Subjective:    Patient ID: John [name], male    DOB: Nov 12, 1945, 75 y.o.   MRN: [id number]  Chief Complaint  Patient presents with  . Testicle Pain    Swollen   . Hypotension    97/50 2 days ago   PT presents to the office today with hypotension. He had surgery on 08/20/20 for endarterectomy of right femoral artery. While there he went into Atrial Flutter and was started on Eliquis 5 mg BID, Plavix 75 mg daily, and metoprolol 37.5 mg daily.   He reports since starting these his BP was been 97/50. He called his Cardiologists who told him to decreased his losartan to 25 mg from 50 mg.   He states his testicles are swollen since Friday morning. He is taking Lasix 40 mg AM and 20 mg afternoon. Denies any dysuria or hematuria. He is followed by Urologists, but does not see him until 09/04/20.  Testicle Pain The patient's primary symptoms include testicular pain.      Review of Systems  Genitourinary: Positive for testicular pain.  Neurological: Positive for weakness.  All other systems reviewed and are negative.      Objective:   Physical Exam Vitals reviewed.  Constitutional:      General: He is not in acute distress.    Appearance: He is well-developed.  HENT:     Head: Normocephalic.     Right Ear: External ear normal.     Left Ear: External ear normal.  Eyes:     General:        Right eye: No discharge.        Left eye: No discharge.     Pupils: Pupils are equal, round, and reactive to light.  Neck:     Thyroid: No thyromegaly.  Cardiovascular:     Rate and Rhythm: Normal rate and regular rhythm.     Heart sounds: Normal heart sounds. No murmur heard.   Pulmonary:     Effort: Pulmonary effort is normal. No respiratory distress.     Breath sounds: Normal breath sounds. No wheezing.  Abdominal:     General: Bowel sounds are normal. There is no distension.     Palpations: Abdomen is soft.     Tenderness: There is no abdominal tenderness.  Musculoskeletal:         General: Swelling (4+ testes and shaft of penis) present. No tenderness. Normal range of motion.     Cervical back: Normal range of motion and neck supple.  Skin:    General: Skin is warm and dry.     Coloration: Skin is pale.     Findings: No erythema or rash.  Neurological:     Mental Status: He is alert and oriented to person, place, and time.     Cranial Nerves: No cranial nerve deficit.     Motor: Weakness present.     Gait: Gait abnormal.     Deep Tendon Reflexes: Reflexes are normal and symmetric.  Psychiatric:        Behavior: Behavior normal.        Thought Content: Thought content normal.        Judgment: Judgment normal.        BP 132/64   Pulse 84   Temp (!) 96.5 F (35.8 C) (Temporal)   Ht 5' 6"  (1.676 m)   Wt 163 lb (73.9 kg)   BMI 26.31 kg/m   Assessment & Plan:  John [name] comes in today  with chief complaint of Testicle Pain (Swollen ) and Hypotension (97/50 2 days ago)   Diagnosis and orders addressed:  1. Atrial flutter, unspecified type ([facility]) - BMP8+EGFR - CBC with Differential/Platelet  2. Hospital discharge follow-up - BMP8+EGFR - CBC with Differential/Platelet  3. PAD (peripheral artery disease) ([facility]) - BMP8+EGFR - CBC with Differential/Platelet  4. Testicle swelling - BMP8+EGFR - CBC with Differential/Platelet - Urinalysis, Complete  5. Pale - BMP8+EGFR - CBC with Differential/Platelet  6. Tobacco abuse - BMP8+EGFR - CBC with Differential/Platelet  7. Hypertension associated with diabetes ([facility]) - losartan (COZAAR) 50 MG tablet; Take 0.5 tablets (25 mg total) by mouth daily.  Dispense: 45 tablet; Refill: 3  8. Congestive heart failure, unspecified HF chronicity, unspecified heart failure type ([facility]) - furosemide (LASIX) 20 MG tablet; Take 2 tablets (40 mg total) by mouth 2 (two) times daily. Take 40 mg in AM and 20 mg in afternoon  Dispense: 180 tablet; Refill: 2  9. Peripheral edema - furosemide (LASIX) 20 MG  tablet; Take 2 tablets (40 mg total) by mouth 2 (two) times daily. Take 40 mg in AM and 20 mg in afternoon  Dispense: 180 tablet; Refill: 2  Will increase Lasix to 40 mg BID from 40 mg AM and 20 mg afternoon. Keep Urologists appt  Continue Eliquis and Plavix  Labs pending Health Maintenance reviewed Diet and exercise encouraged  Follow up plan: 1-2 weeks    Evelina Dun, FNP

## 2020-08-30 NOTE — Patient Instructions (Signed)
Scrotal Swelling Scrotal swelling is a condition in which the sac of skin that contains the testicles, blood vessels, and structures that help deliver sperm and semen (scrotum) is enlarged or swollen. This can happen on one or both sides of the scrotum. Many things can cause the scrotum to enlarge or swell, including:  Fluid around the testicle (hydrocele).  A weakened area in the muscles around the groin (hernia).  An enlarged vein around the testicle.  An injury.  An infection.  Certain medical treatments.  Certain medical conditions, such as congestive heart failure.  A recent genital surgery or procedure.  A twisting of the spermatic cord that cuts off blood supply (testicular torsion).  Testicular cancer. Scrotal swelling can happen along with scrotal pain. Follow these instructions at home: Activity  Rest as told by your health care provider. The best position is to lie down.  Do not lift anything that is heavier than 5 lb (2.3 kg), or the limit that you are told, until your health care provider says that it is safe.  Avoid sexual activity until your health care provider says that it is safe. General instructions  Take over-the-counter and prescription medicines only as told by your health care provider.  Perform a monthly self-exam of the scrotum and penis. Feel for changes. Ask your health care provider how to perform a monthly self-exam if you are unsure.  Keep all follow-up visits. This is important. Managing pain, stiffness, and swelling  If directed, put ice on the affected area. To do this: ? Put ice in a plastic bag. ? Place a towel between your skin and the bag. ? Leave the ice on for 20 minutes, 2-3 times a day. ? Remove the ice if your skin turns bright red. This is very important. If you cannot feel pain, heat, or cold, you have a greater risk of damage to the area.  Place a rolled towel under your testicles for support or use underwear with a  supportive pouch.  Wear an athletic support cup or scrotal support, such as a jock strap, for comfort. Contact a health care provider if:  You have sudden pain that is persistent and does not improve.  You have a heavy feeling or notice fluid in the scrotum.  You have pain or burning while urinating.  You have blood in your urine or semen.  You feel a lump around the testicle.  You notice that one testicle is larger than the other. Keep in mind that a small difference in size is normal.  You have a persistent dull ache or pain in your groin or scrotum. Get help right away if:  The pain does not go away.  The pain becomes severe.  You have a fever or chills.  You have pain or vomiting that cannot be controlled.  One or both sides of the scrotum are very red and swollen.  There is redness spreading upward from your scrotum to your abdomen or downward from your scrotum to your thighs. Summary  Scrotal swelling is a condition in which the sac of skin that contains the testicles, blood vessels, and structures that help deliver the sperm and semen (scrotum) is enlarged or swollen.  Many things can cause the scrotum to swell, including fluid around the testicle (hydrocele), a weakened area in the muscles around the groin (hernia), and an enlarged vein around the testicle.  Icing the scrotum or using underwear with a supportive pouch may help reduce swelling and pain.  Contact a health care provider if you develop scrotal pain that is sudden and persistent, you have pain while urinating, you feel a lump around the testicle, or you notice blood in your urine or semen.  Get help right away if you have uncontrolled pain or vomiting, a very red and swollen scrotum, or a fever or chills. This information is not intended to replace advice given to you by your health care provider. Make sure you discuss any questions you have with your health care provider. Document Revised: 11/22/2019  Document Reviewed: 11/22/2019 Elsevier Patient Education  2021 [employer].

## 2020-08-30 NOTE — Telephone Encounter (Signed)
See previous message- patient was notified to seek help in the ED for evaluation regarding symptoms.

## 2020-08-31 ENCOUNTER — Telehealth: Payer: Self-pay | Admitting: Family

## 2020-08-31 ENCOUNTER — Other Ambulatory Visit: Payer: Self-pay | Admitting: Family

## 2020-08-31 ENCOUNTER — Other Ambulatory Visit: Payer: Self-pay

## 2020-08-31 DIAGNOSIS — Z95820 Peripheral vascular angioplasty status with implants and grafts: Secondary | ICD-10-CM | POA: Diagnosis not present

## 2020-08-31 DIAGNOSIS — I110 Hypertensive heart disease with heart failure: Secondary | ICD-10-CM | POA: Diagnosis not present

## 2020-08-31 DIAGNOSIS — Z48812 Encounter for surgical aftercare following surgery on the circulatory system: Secondary | ICD-10-CM | POA: Diagnosis not present

## 2020-08-31 DIAGNOSIS — N401 Enlarged prostate with lower urinary tract symptoms: Secondary | ICD-10-CM | POA: Diagnosis not present

## 2020-08-31 DIAGNOSIS — J9611 Chronic respiratory failure with hypoxia: Secondary | ICD-10-CM | POA: Diagnosis not present

## 2020-08-31 DIAGNOSIS — J449 Chronic obstructive pulmonary disease, unspecified: Secondary | ICD-10-CM | POA: Diagnosis not present

## 2020-08-31 DIAGNOSIS — E1151 Type 2 diabetes mellitus with diabetic peripheral angiopathy without gangrene: Secondary | ICD-10-CM | POA: Diagnosis not present

## 2020-08-31 DIAGNOSIS — I4892 Unspecified atrial flutter: Secondary | ICD-10-CM | POA: Diagnosis not present

## 2020-08-31 DIAGNOSIS — I509 Heart failure, unspecified: Secondary | ICD-10-CM | POA: Diagnosis not present

## 2020-08-31 DIAGNOSIS — E1165 Type 2 diabetes mellitus with hyperglycemia: Secondary | ICD-10-CM | POA: Diagnosis not present

## 2020-08-31 DIAGNOSIS — I739 Peripheral vascular disease, unspecified: Secondary | ICD-10-CM

## 2020-08-31 MED ORDER — CEPHALEXIN 500 MG PO CAPS: 500.0000 mg | ORAL_CAPSULE | Freq: Two times a day (BID) | ORAL | 0 refills | Status: DC

## 2020-08-31 NOTE — Telephone Encounter (Signed)
Only urinated once this morning. Pt upped lasix to 40 in the morning and 40mg  in the evening. Patient only went about 3 times yesterday. Home health nurse comes today around 12-1.

## 2020-08-31 NOTE — Telephone Encounter (Signed)
Pt says he does not think that the lasix that he started taking it helping and he also feels weak. Please call back and advise.

## 2020-08-31 NOTE — Telephone Encounter (Signed)
Pt rc for nurse 

## 2020-08-31 NOTE — Telephone Encounter (Signed)
This RN spoke to [place] from [facility], per Friend patient has had general swelling and scrotal swelling for 1 week. Pt denies shortness of breath, denies dizziness, denies chest pain. Pt reports he is asymptomatic aside from swelling. Pt's weight is 164.8, compared to 162 2 days ago. Pt BP is 112/62, per Colletta Maryland, pt has stopped taking metoprolol on his own. Pt went to his PCP yesterday with the same complaint, per Colletta Maryland pt's PCP increased his Lasix to 40mg  in the morning and 40mg  in the evening. This RN advised patient that the primary care physician's recommendations should be followed and that Dr. Farris Has would be made aware. Patient advised to keep appointment with Dr. Farris Has June 8, advised to go to the emergency room or call 911 if his symptoms worsen or he develops shortness of breath, chest pain, or dizziness in the interim.   Colletta Maryland also asked for the patient if he should continue both Eliquis and Plavix, this RN passed on Dr. Oneta Rack recommendation:  O'Neal, Cassie Freer, MD to Raiford Simmonds, RN     1:10 PM Note Please make sure he is not missing any doses of Plavix.  He does had a recent stent placed by vascular surgery.  Also needs to continue on his Eliquis.  I sent a message to Anderson Malta that he just needs to be evaluated emergency room given his ongoing symptoms.  I am not sure what is going on.  [place] T. Audie Box, MD, [place] verbalized understanding. This RN to send patient's concerns to Dr. Farris Has for review as well.

## 2020-08-31 NOTE — Telephone Encounter (Signed)
Follow up:     John [name] with [place] care calling to speak with some one concering patient medication.

## 2020-09-03 LAB — URINE CULTURE

## 2020-09-04 ENCOUNTER — Ambulatory Visit (INDEPENDENT_AMBULATORY_CARE_PROVIDER_SITE_OTHER): Payer: Medicare Other | Admitting: Vascular Surgery

## 2020-09-04 ENCOUNTER — Other Ambulatory Visit: Payer: Self-pay | Admitting: Family Medicine

## 2020-09-04 ENCOUNTER — Ambulatory Visit: Payer: Medicare Other | Admitting: Physician Assistant

## 2020-09-04 ENCOUNTER — Other Ambulatory Visit: Payer: Self-pay

## 2020-09-04 ENCOUNTER — Ambulatory Visit: Payer: Medicare Other | Admitting: Family

## 2020-09-04 ENCOUNTER — Encounter: Payer: Self-pay | Admitting: Vascular Surgery

## 2020-09-04 VITALS — BP 129/54 | HR 86 | Temp 97.2°F | Ht 66.0 in | Wt 166.7 lb

## 2020-09-04 DIAGNOSIS — E1151 Type 2 diabetes mellitus with diabetic peripheral angiopathy without gangrene: Secondary | ICD-10-CM | POA: Diagnosis not present

## 2020-09-04 DIAGNOSIS — E1165 Type 2 diabetes mellitus with hyperglycemia: Secondary | ICD-10-CM | POA: Diagnosis not present

## 2020-09-04 DIAGNOSIS — I110 Hypertensive heart disease with heart failure: Secondary | ICD-10-CM | POA: Diagnosis not present

## 2020-09-04 DIAGNOSIS — Z48812 Encounter for surgical aftercare following surgery on the circulatory system: Secondary | ICD-10-CM | POA: Diagnosis not present

## 2020-09-04 DIAGNOSIS — I739 Peripheral vascular disease, unspecified: Secondary | ICD-10-CM

## 2020-09-04 DIAGNOSIS — N401 Enlarged prostate with lower urinary tract symptoms: Secondary | ICD-10-CM | POA: Diagnosis not present

## 2020-09-04 DIAGNOSIS — Z95820 Peripheral vascular angioplasty status with implants and grafts: Secondary | ICD-10-CM | POA: Diagnosis not present

## 2020-09-04 DIAGNOSIS — J449 Chronic obstructive pulmonary disease, unspecified: Secondary | ICD-10-CM | POA: Diagnosis not present

## 2020-09-04 DIAGNOSIS — I509 Heart failure, unspecified: Secondary | ICD-10-CM | POA: Diagnosis not present

## 2020-09-04 DIAGNOSIS — J9611 Chronic respiratory failure with hypoxia: Secondary | ICD-10-CM | POA: Diagnosis not present

## 2020-09-04 DIAGNOSIS — I4892 Unspecified atrial flutter: Secondary | ICD-10-CM | POA: Diagnosis not present

## 2020-09-04 NOTE — Progress Notes (Signed)
Patient name: John [name] MRN: [id number] DOB: 10-01-1945 Sex: male  REASON FOR VISIT: Evaluate pus draining from right groin incision  HPI: MANAV PIEROTTI is a 75 y.o. male with multiple medical comorbidities that presents as a triage add on for evaluation of potential drainage from his right groin incision.  He is well-known and recently underwent right common femoral endarterectomy with profundoplasty and bovine patch angioplasty with a right common iliac stent on Aug 20, 2020 for short distance lifestyle limiting claudication.  On arrival today he is not having any fevers.  He denies any drainage from the groin.  States his significant other noticed some discolored tissue and became concerned.  His right leg feels much better.  Past Medical History:  Diagnosis Date  . Adenocarcinoma of lung, right ([place]) 04/18/2016  . Anxiety   . Arthritis   . Asthma   . BPH (benign prostatic hyperplasia)    with urinary retention 02/06/20  . CHF (congestive heart failure) ([place])   . COPD (chronic obstructive pulmonary disease) ([place])   . Depression   . Diabetes mellitus without complication (H[facility])    no meds  . Diabetes mellitus, type II ([place])   . DM (diabetes mellitus) ([place]) 07/09/2016  . Dyspnea   . History of kidney stones   . History of radiation therapy 06/25/16-08/20/16   right lung  . Hyperlipidemia   . Hypertension   . Hypertension   . Hypothyroidism   . Macular degeneration   . Neuropathy   . Non-small cell lung cancer, right ([place]) 04/18/2016  . Peripheral vascular disease ([place])   . Prostatitis   . Pulmonary nodule, left 07/16/2016  . Sleep apnea    cpap    Past Surgical History:  Procedure Laterality Date  . ABDOMINAL AORTOGRAM W/LOWER EXTREMITY Left 02/06/2020   Procedure: ABDOMINAL AORTOGRAM W/LOWER EXTREMITY;  Surgeon: Lorretta Harp, MD;  Location: [facility];  Service: Cardiovascular;  Laterality: Left;  . CATARACT EXTRACTION, BILATERAL Bilateral   .  ENDARTERECTOMY FEMORAL Right 08/20/2020   Procedure: ENDARTERECTOMY  RIGHT FEMORAL ARTERY;  Surgeon: Marty Heck, MD;  Location: [facility];  Service: Vascular;  Laterality: Right;  . INSERTION OF ILIAC STENT Right 08/20/2020   Procedure: RETROGRADE INSERTION OF RIGHT ILIAC STENT;  Surgeon: Marty Heck, MD;  Location: [place];  Service: Vascular;  Laterality: Right;  . INTRAOPERATIVE ARTERIOGRAM Right 08/20/2020   Procedure: INTRA OPERATIVE ARTERIOGRAM ILIAC;  Surgeon: Marty Heck, MD;  Location: [place];  Service: Vascular;  Laterality: Right;  . PATCH ANGIOPLASTY Right 08/20/2020   Procedure: PATCH ANGIOPLASTY RIGHT FEMORAL ARTERY;  Surgeon: Marty Heck, MD;  Location: [place];  Service: Vascular;  Laterality: Right;  . PORTACATH PLACEMENT Left 06/13/2016   Procedure: INSERTION PORT-A-CATH;  Surgeon: Aviva Signs, MD;  Location: [facility];  Service: General;  Laterality: Left;  . TRANSURETHRAL RESECTION OF PROSTATE N/A 05/31/2020   Procedure: TRANSURETHRAL RESECTION OF THE PROSTATE (TURP);  Surgeon: Cleon Gustin, MD;  Location: [facility];  Service: Urology;  Laterality: N/A;  . VIDEO BRONCHOSCOPY WITH ENDOBRONCHIAL NAVIGATION N/A 05/28/2016   Procedure: VIDEO BRONCHOSCOPY WITH ENDOBRONCHIAL NAVIGATION;  Surgeon: [name], MD;  Location: [facility];  Service: Thoracic;  Laterality: N/A;  . VIDEO BRONCHOSCOPY WITH ENDOBRONCHIAL ULTRASOUND N/A 05/28/2016   Procedure: VIDEO BRONCHOSCOPY WITH ENDOBRONCHIAL ULTRASOUND;  Surgeon: [name], MD;  Location: [facility];  Service: Thoracic;  Laterality: N/A;    Family History  Problem Relation Age  of Onset  . Hypertension Mother   . Diabetes Father   . Heart disease Father   . Stroke Father   . Hypertension Sister     SOCIAL HISTORY: Social History   Tobacco Use  . Smoking status: Current Every Day Smoker    Packs/day: 0.50    Years: 55.00    Pack years: 27.50    Types: Cigarettes    Start date: 03/11/1961  .  Smokeless tobacco: Never Used  . Tobacco comment: 1/2 pack to 1 pack per day 12/14/19  Substance Use Topics  . Alcohol use: No    Allergies  Allergen Reactions  . Jardiance [Empagliflozin] Other (See Comments)    FEELS SLUGGISH, TIRED  . Lopressor [Metoprolol] Other (See Comments)    Fatigue  . Atorvastatin     Muscle aches - tolerating Pravastatin 40 mg MWF  . Rosuvastatin     Muscle aches - tolerating Pravastatin 40 mg MWF  . Sulfa Antibiotics Swelling    Mouth swelling    Current Outpatient Medications  Medication Sig Dispense Refill  . albuterol (VENTOLIN HFA) 108 (90 Base) MCG/ACT inhaler Inhale 2 puffs into the lungs every 4 (four) hours as needed for wheezing or shortness of breath. 8 g 6  . apixaban (ELIQUIS) 5 MG TABS tablet Take 1 tablet (5 mg total) by mouth 2 (two) times daily. 60 tablet 3  . aspirin EC 81 MG tablet Take 81 mg by mouth daily.    . bacitracin 500 UNIT/GM ointment Apply 1 application topically 2 (two) times daily. 15 g 0  . Blood Glucose Monitoring Suppl (ONETOUCH VERIO REFLECT) w/Device KIT Use to test blood sugars daily as directed. DX: E11.9 1 kit 1  . cephALEXin (KEFLEX) 500 MG capsule Take 1 capsule (500 mg total) by mouth 2 (two) times daily. 14 capsule 0  . cloNIDine (CATAPRES) 0.1 MG tablet TAKE 1 TABLET (0.1 MG TOTAL) BY MOUTH 3 (THREE) TIMES DAILY. 270 tablet 0  . clopidogrel (PLAVIX) 75 MG tablet Take 1 tablet (75 mg total) by mouth daily at 6 (six) AM. 30 tablet 11  . dutasteride (AVODART) 0.5 MG capsule TAKE 1 CAPSULE BY MOUTH EVERY DAY 90 capsule 1  . ezetimibe (ZETIA) 10 MG tablet Take 1 tablet (10 mg total) by mouth daily. 30 tablet 1  . fluticasone (FLONASE) 50 MCG/ACT nasal spray Place 1 spray into both nostrils daily as needed for allergies or rhinitis. (Patient taking differently: Place 1 spray into both nostrils daily.) 18.2 mL 2  . furosemide (LASIX) 20 MG tablet Take 2 tablets (40 mg total) by mouth 2 (two) times daily. Take 40 mg in AM  and 20 mg in afternoon 180 tablet 2  . gabapentin (NEURONTIN) 300 MG capsule TAKE 1 CAPSULE BY MOUTH THREE TIMES A DAY (Patient taking differently: Take 300 mg by mouth 3 (three) times daily.) 270 capsule 0  . glucose blood (ONETOUCH VERIO) test strip Use to test blood sugars daily as directed. DX: E11.9 100 each 12  . HYDROcodone-acetaminophen (NORCO) 5-325 MG tablet Take 1 tablet by mouth every 6 (six) hours as needed for moderate pain. 20 tablet 0  . linaclotide (LINZESS) 145 MCG CAPS capsule Take 1 capsule (145 mcg total) by mouth daily. To regulate bowel movements 30 capsule 5  . losartan (COZAAR) 50 MG tablet Take 0.5 tablets (25 mg total) by mouth daily. 45 tablet 3  . meloxicam (MOBIC) 7.5 MG tablet Take 1 tablet (7.5 mg total) by mouth daily as  needed for pain (back pain). 90 tablet 0  . metFORMIN (GLUCOPHAGE-XR) 500 MG 24 hr tablet Take 1 tablet (500 mg total) by mouth daily with breakfast. 90 tablet 1  . Meth-Hyo-M Bl-Na Phos-Ph Sal (URO-MP) 118 MG CAPS TAKE 1 CAPSULE (118 MG TOTAL) BY MOUTH 2 (TWO) TIMES DAILY AS NEEDED. (Patient taking differently: Take 118 mg by mouth daily as needed (Uninary burning).) 30 capsule 3  . ondansetron (ZOFRAN-ODT) 4 MG disintegrating tablet TAKE 1 TABLET BY MOUTH EVERY 8 HOURS AS NEEDED FOR NAUSEA AND VOMITING (Patient taking differently: Take 4 mg by mouth every 8 (eight) hours as needed for vomiting or nausea.) 20 tablet 0  . OneTouch Delica Lancets 36O MISC Use to test blood sugars daily as directed. DX: E11.9 100 each 1  . polyethylene glycol (MIRALAX / GLYCOLAX) 17 g packet Take 17 g by mouth daily as needed for moderate constipation or severe constipation. 14 each 0  . pravastatin (PRAVACHOL) 40 MG tablet TAKE 1 TABLET (40 MG TOTAL) BY MOUTH ON MONDAYS , WEDNESDAYS, AND FRIDAYS AS DIRECTED (Patient taking differently: Take 40 mg by mouth every Monday, Wednesday, and Friday.) 48 tablet 3  . Semaglutide (RYBELSUS) 7 MG TABS Take 7 mg by mouth daily. 90  tablet 1  . temazepam (RESTORIL) 7.5 MG capsule Take 1 capsule (7.5 mg total) by mouth at bedtime as needed for sleep. 30 capsule 1  . levothyroxine (SYNTHROID) 50 MCG tablet Take 1 tablet (50 mcg total) by mouth daily. 30 tablet 2  . metoprolol succinate (TOPROL-XL) 25 MG 24 hr tablet Take 1.5 tablets (37.5 mg total) by mouth daily. Take with or immediately following a meal. (Patient not taking: Reported on 09/04/2020)     No current facility-administered medications for this visit.    REVIEW OF SYSTEMS:  _0  denotes positive finding, _1  denotes negative finding Cardiac  Comments:  Chest pain or chest pressure:    Shortness of breath upon exertion:    Short of breath when lying flat:    Irregular heart rhythm:        Vascular    Pain in calf, thigh, or hip brought on by ambulation:    Pain in feet at night that wakes you up from your sleep:     Blood clot in your veins:    Leg swelling:         Pulmonary    Oxygen at home:    Productive cough:     Wheezing:         Neurologic    Sudden weakness in arms or legs:     Sudden numbness in arms or legs:     Sudden onset of difficulty speaking or slurred speech:    Temporary loss of vision in one eye:     Problems with dizziness:         Gastrointestinal    Blood in stool:     Vomited blood:         Genitourinary    Burning when urinating:     Blood in urine:        Psychiatric    Major depression:         Hematologic    Bleeding problems:    Problems with blood clotting too easily:        Skin    Rashes or ulcers:        Constitutional    Fever or chills:      PHYSICAL EXAM: Vitals:  09/04/20 1134  BP: (!) 129/54  Pulse: 86  Temp: (!) 97.2 F (36.2 C)  TempSrc: Skin  SpO2: 98%  Weight: 166 lb 11.2 oz (75.6 kg)  Height: _0  (1.676 m)    GENERAL: The patient is a well-nourished male, in no acute distress. The vital signs are documented above. CARDIAC: There is a regular rate and rhythm.  VASCULAR:   Right groin incision pictured below with fat necrosis No drainage, no overt cellulitis Right PT palpable      DATA:   None  Assessment/Plan:  75 year old male status post right common femoral endarterectomy with profundoplasty and bovine pericardial patch angioplasty and right common iliac stent on 08/20/2020 for short distance lifestyle limiting claudication.  As pictured above I discussed that this is not infected but appears to be some fat necrosis at the lateral incision.  I discussed washing this daily with Dial soap and keeping it dry.  Then applying Betadine paint and a dry dressing.  I will see him next week for another wound check. He has a nice PT pulse at the ankle.   Marty Heck, MD [facility] Office: [phone]

## 2020-09-05 ENCOUNTER — Encounter: Payer: Self-pay | Admitting: Family

## 2020-09-05 ENCOUNTER — Observation Stay (HOSPITAL_COMMUNITY)
Admission: EM | Admit: 2020-09-05 | Discharge: 2020-09-06 | Disposition: A | Discharge status: Home / Self Care | Payer: Medicare Other | Attending: Internal Medicine | Admitting: Internal Medicine

## 2020-09-05 ENCOUNTER — Ambulatory Visit: Payer: Medicare Other | Admitting: Radiation Oncology

## 2020-09-05 ENCOUNTER — Other Ambulatory Visit: Payer: Self-pay

## 2020-09-05 ENCOUNTER — Observation Stay (HOSPITAL_COMMUNITY): Payer: Medicare Other

## 2020-09-05 ENCOUNTER — Ambulatory Visit (INDEPENDENT_AMBULATORY_CARE_PROVIDER_SITE_OTHER): Payer: Medicare Other | Admitting: Family

## 2020-09-05 VITALS — BP 126/63 | HR 114 | Temp 98.7°F | Ht 66.0 in | Wt 167.4 lb

## 2020-09-05 DIAGNOSIS — D649 Anemia, unspecified: Secondary | ICD-10-CM

## 2020-09-05 DIAGNOSIS — Z85118 Personal history of other malignant neoplasm of bronchus and lung: Secondary | ICD-10-CM | POA: Insufficient documentation

## 2020-09-05 DIAGNOSIS — C3491 Malignant neoplasm of unspecified part of right bronchus or lung: Secondary | ICD-10-CM | POA: Insufficient documentation

## 2020-09-05 DIAGNOSIS — K259 Gastric ulcer, unspecified as acute or chronic, without hemorrhage or perforation: Secondary | ICD-10-CM | POA: Diagnosis not present

## 2020-09-05 DIAGNOSIS — Z720 Tobacco use: Secondary | ICD-10-CM | POA: Diagnosis present

## 2020-09-05 DIAGNOSIS — J45909 Unspecified asthma, uncomplicated: Secondary | ICD-10-CM | POA: Diagnosis not present

## 2020-09-05 DIAGNOSIS — E1142 Type 2 diabetes mellitus with diabetic polyneuropathy: Secondary | ICD-10-CM

## 2020-09-05 DIAGNOSIS — K5750 Diverticulosis of both small and large intestine without perforation or abscess without bleeding: Secondary | ICD-10-CM | POA: Diagnosis not present

## 2020-09-05 DIAGNOSIS — R0602 Shortness of breath: Secondary | ICD-10-CM | POA: Diagnosis not present

## 2020-09-05 DIAGNOSIS — I2510 Atherosclerotic heart disease of native coronary artery without angina pectoris: Secondary | ICD-10-CM

## 2020-09-05 DIAGNOSIS — K253 Acute gastric ulcer without hemorrhage or perforation: Secondary | ICD-10-CM

## 2020-09-05 DIAGNOSIS — K922 Gastrointestinal hemorrhage, unspecified: Secondary | ICD-10-CM

## 2020-09-05 DIAGNOSIS — D696 Thrombocytopenia, unspecified: Secondary | ICD-10-CM | POA: Insufficient documentation

## 2020-09-05 DIAGNOSIS — C3411 Malignant neoplasm of upper lobe, right bronchus or lung: Secondary | ICD-10-CM | POA: Diagnosis not present

## 2020-09-05 DIAGNOSIS — Z87891 Personal history of nicotine dependence: Secondary | ICD-10-CM | POA: Diagnosis not present

## 2020-09-05 DIAGNOSIS — C3490 Malignant neoplasm of unspecified part of unspecified bronchus or lung: Secondary | ICD-10-CM | POA: Diagnosis not present

## 2020-09-05 DIAGNOSIS — J9621 Acute and chronic respiratory failure with hypoxia: Secondary | ICD-10-CM | POA: Insufficient documentation

## 2020-09-05 DIAGNOSIS — N3000 Acute cystitis without hematuria: Secondary | ICD-10-CM | POA: Diagnosis not present

## 2020-09-05 DIAGNOSIS — Z7984 Long term (current) use of oral hypoglycemic drugs: Secondary | ICD-10-CM | POA: Insufficient documentation

## 2020-09-05 DIAGNOSIS — R195 Other fecal abnormalities: Secondary | ICD-10-CM | POA: Diagnosis not present

## 2020-09-05 DIAGNOSIS — I739 Peripheral vascular disease, unspecified: Secondary | ICD-10-CM | POA: Diagnosis not present

## 2020-09-05 DIAGNOSIS — I480 Paroxysmal atrial fibrillation: Secondary | ICD-10-CM | POA: Insufficient documentation

## 2020-09-05 DIAGNOSIS — Z79899 Other long term (current) drug therapy: Secondary | ICD-10-CM | POA: Diagnosis not present

## 2020-09-05 DIAGNOSIS — E039 Hypothyroidism, unspecified: Secondary | ICD-10-CM | POA: Diagnosis not present

## 2020-09-05 DIAGNOSIS — Z7902 Long term (current) use of antithrombotics/antiplatelets: Secondary | ICD-10-CM

## 2020-09-05 DIAGNOSIS — R079 Chest pain, unspecified: Secondary | ICD-10-CM

## 2020-09-05 DIAGNOSIS — I4892 Unspecified atrial flutter: Secondary | ICD-10-CM | POA: Diagnosis present

## 2020-09-05 DIAGNOSIS — D62 Acute posthemorrhagic anemia: Secondary | ICD-10-CM

## 2020-09-05 DIAGNOSIS — Z7901 Long term (current) use of anticoagulants: Secondary | ICD-10-CM | POA: Diagnosis not present

## 2020-09-05 DIAGNOSIS — E119 Type 2 diabetes mellitus without complications: Secondary | ICD-10-CM | POA: Diagnosis not present

## 2020-09-05 DIAGNOSIS — J441 Chronic obstructive pulmonary disease with (acute) exacerbation: Secondary | ICD-10-CM | POA: Insufficient documentation

## 2020-09-05 DIAGNOSIS — G4733 Obstructive sleep apnea (adult) (pediatric): Secondary | ICD-10-CM | POA: Diagnosis not present

## 2020-09-05 DIAGNOSIS — Z9989 Dependence on other enabling machines and devices: Secondary | ICD-10-CM

## 2020-09-05 DIAGNOSIS — Z20822 Contact with and (suspected) exposure to covid-19: Secondary | ICD-10-CM | POA: Diagnosis not present

## 2020-09-05 DIAGNOSIS — F17210 Nicotine dependence, cigarettes, uncomplicated: Secondary | ICD-10-CM | POA: Diagnosis not present

## 2020-09-05 DIAGNOSIS — C3412 Malignant neoplasm of upper lobe, left bronchus or lung: Secondary | ICD-10-CM | POA: Diagnosis not present

## 2020-09-05 LAB — CBC WITH DIFFERENTIAL/PLATELET
Abs Immature Granulocytes: 0.05 10*3/uL (ref 0.00–0.07)
Basophils Absolute: 0.1 10*3/uL (ref 0.0–0.1)
Basophils Relative: 1 %
Eosinophils Absolute: 0.1 10*3/uL (ref 0.0–0.5)
Eosinophils Relative: 1 %
HCT: 28.7 % — ABNORMAL LOW (ref 39.0–52.0)
Hemoglobin: 8 g/dL — ABNORMAL LOW (ref 13.0–17.0)
Immature Granulocytes: 1 %
Lymphocytes Relative: 7 %
Lymphs Abs: 0.7 10*3/uL (ref 0.7–4.0)
MCH: 24.2 pg — ABNORMAL LOW (ref 26.0–34.0)
MCHC: 27.9 g/dL — ABNORMAL LOW (ref 30.0–36.0)
MCV: 86.7 fL (ref 80.0–100.0)
Monocytes Absolute: 1.3 10*3/uL — ABNORMAL HIGH (ref 0.1–1.0)
Monocytes Relative: 12 %
Neutro Abs: 8.2 10*3/uL — ABNORMAL HIGH (ref 1.7–7.7)
Neutrophils Relative %: 78 %
Platelets: 551 10*3/uL — ABNORMAL HIGH (ref 150–400)
RBC: 3.31 MIL/uL — ABNORMAL LOW (ref 4.22–5.81)
RDW: 19.2 % — ABNORMAL HIGH (ref 11.5–15.5)
WBC: 10.4 10*3/uL (ref 4.0–10.5)
nRBC: 0.2 % (ref 0.0–0.2)

## 2020-09-05 LAB — RESP PANEL BY RT-PCR (FLU A&B, COVID) ARPGX2
Influenza A by PCR: NEGATIVE
Influenza B by PCR: NEGATIVE
SARS Coronavirus 2 by RT PCR: NEGATIVE

## 2020-09-05 LAB — HEMOGLOBIN AND HEMATOCRIT, BLOOD
HCT: 28.1 % — ABNORMAL LOW (ref 39.0–52.0)
HCT: 26.3 % — ABNORMAL LOW (ref 39.0–52.0)
Hemoglobin: 7.8 g/dL — ABNORMAL LOW (ref 13.0–17.0)
Hemoglobin: 7.6 g/dL — ABNORMAL LOW (ref 13.0–17.0)

## 2020-09-05 LAB — COMPREHENSIVE METABOLIC PANEL
ALT: 12 U/L (ref 0–44)
AST: 17 U/L (ref 15–41)
Albumin: 3.1 g/dL — ABNORMAL LOW (ref 3.5–5.0)
Alkaline Phosphatase: 99 U/L (ref 38–126)
Anion gap: 10 (ref 5–15)
BUN: 18 mg/dL (ref 8–23)
CO2: 28 mmol/L (ref 22–32)
Calcium: 8.7 mg/dL — ABNORMAL LOW (ref 8.9–10.3)
Chloride: 96 mmol/L — ABNORMAL LOW (ref 98–111)
Creatinine, Ser: 1.19 mg/dL (ref 0.61–1.24)
GFR, Estimated: >60 mL/min (ref >60)
Glucose, Bld: 165 mg/dL — ABNORMAL HIGH (ref 70–99)
Potassium: 4 mmol/L (ref 3.5–5.1)
Sodium: 134 mmol/L — ABNORMAL LOW (ref 135–145)
Total Bilirubin: 0.6 mg/dL (ref 0.3–1.2)
Total Protein: 6.4 g/dL — ABNORMAL LOW (ref 6.5–8.1)

## 2020-09-05 LAB — GLUCOSE, CAPILLARY
Glucose-Capillary: 126 mg/dL — ABNORMAL HIGH (ref 70–99)
Glucose-Capillary: 182 mg/dL — ABNORMAL HIGH (ref 70–99)

## 2020-09-05 LAB — BRAIN NATRIURETIC PEPTIDE: B Natriuretic Peptide: 177.7 pg/mL — ABNORMAL HIGH (ref 0.0–100.0)

## 2020-09-05 LAB — POC OCCULT BLOOD, ED: Fecal Occult Bld: POSITIVE — AB

## 2020-09-05 LAB — PROTIME-INR
INR: 1.1 (ref 0.8–1.2)
Prothrombin Time: 14.6 seconds (ref 11.4–15.2)

## 2020-09-05 LAB — PREPARE RBC (CROSSMATCH)

## 2020-09-05 LAB — HEMOGLOBIN, FINGERSTICK: Hemoglobin: 7.3 g/dL — ABNORMAL LOW (ref 12.6–17.7)

## 2020-09-05 MED ORDER — EZETIMIBE 10 MG PO TABS
10.0000 mg | ORAL_TABLET | Freq: Every day | ORAL | Status: DC
  Administered 2020-09-05 – 2020-09-06 (×2): 10 mg via ORAL
  Filled 2020-09-05 (×3): qty 1

## 2020-09-05 MED ORDER — ONDANSETRON HCL 4 MG PO TABS: 4.0000 mg | ORAL_TABLET | Freq: Four times a day (QID) | ORAL | Status: DC | PRN

## 2020-09-05 MED ORDER — FUROSEMIDE 10 MG/ML IJ SOLN
20.0000 mg | Freq: Two times a day (BID) | INTRAMUSCULAR | Status: DC
  Administered 2020-09-05 – 2020-09-06 (×2): 20 mg via INTRAVENOUS
  Filled 2020-09-05 (×2): qty 2

## 2020-09-05 MED ORDER — IOHEXOL 300 MG/ML  SOLN
75.0000 mL | Freq: Once | INTRAMUSCULAR | Status: AC | PRN
  Administered 2020-09-05: 75 mL via INTRAVENOUS

## 2020-09-05 MED ORDER — PANTOPRAZOLE SODIUM 40 MG IV SOLR
40.0000 mg | INTRAVENOUS | Status: DC
  Administered 2020-09-05: 40 mg via INTRAVENOUS
  Filled 2020-09-05: qty 40

## 2020-09-05 MED ORDER — CIPROFLOXACIN HCL 500 MG PO TABS: 500.0000 mg | ORAL_TABLET | Freq: Two times a day (BID) | ORAL | 0 refills | Status: DC

## 2020-09-05 MED ORDER — METHYLPREDNISOLONE SODIUM SUCC 125 MG IJ SOLR
125.0000 mg | Freq: Once | INTRAMUSCULAR | Status: AC
  Administered 2020-09-05: 125 mg via INTRAVENOUS
  Filled 2020-09-05: qty 2

## 2020-09-05 MED ORDER — ONDANSETRON HCL 4 MG/2ML IJ SOLN: 4.0000 mg | Freq: Four times a day (QID) | INTRAMUSCULAR | Status: DC | PRN

## 2020-09-05 MED ORDER — INSULIN ASPART 100 UNIT/ML IJ SOLN
0.0000 [IU] | Freq: Three times a day (TID) | INTRAMUSCULAR | Status: DC
  Administered 2020-09-05: 1 [IU] via SUBCUTANEOUS
  Administered 2020-09-06 (×2): 3 [IU] via SUBCUTANEOUS

## 2020-09-05 MED ORDER — NICOTINE 14 MG/24HR TD PT24
14.0000 mg | MEDICATED_PATCH | Freq: Every day | TRANSDERMAL | Status: DC
  Administered 2020-09-05 – 2020-09-06 (×2): 14 mg via TRANSDERMAL
  Filled 2020-09-05 (×2): qty 1

## 2020-09-05 MED ORDER — ACETAMINOPHEN 650 MG RE SUPP: 650.0000 mg | Freq: Four times a day (QID) | RECTAL | Status: DC | PRN

## 2020-09-05 MED ORDER — ALBUTEROL SULFATE (2.5 MG/3ML) 0.083% IN NEBU: 2.5000 mg | INHALATION_SOLUTION | RESPIRATORY_TRACT | Status: DC | PRN

## 2020-09-05 MED ORDER — SODIUM CHLORIDE 0.9 % IV SOLN
1.0000 g | INTRAVENOUS | Status: DC
  Administered 2020-09-05: 1 g via INTRAVENOUS
  Filled 2020-09-05 (×2): qty 10

## 2020-09-05 MED ORDER — NICOTINE 21 MG/24HR TD PT24: 21.0000 mg | MEDICATED_PATCH | Freq: Every day | TRANSDERMAL | Status: DC

## 2020-09-05 MED ORDER — PANTOPRAZOLE SODIUM 40 MG IV SOLR
40.0000 mg | Freq: Two times a day (BID) | INTRAVENOUS | Status: DC
  Administered 2020-09-06: 40 mg via INTRAVENOUS
  Filled 2020-09-05: qty 40

## 2020-09-05 MED ORDER — FLUTICASONE PROPIONATE 50 MCG/ACT NA SUSP
1.0000 | Freq: Every day | NASAL | Status: DC
  Filled 2020-09-05: qty 16

## 2020-09-05 MED ORDER — ACETAMINOPHEN 325 MG PO TABS: 650.0000 mg | ORAL_TABLET | Freq: Four times a day (QID) | ORAL | Status: DC | PRN

## 2020-09-05 MED ORDER — APIXABAN 5 MG PO TABS
5.0000 mg | ORAL_TABLET | Freq: Two times a day (BID) | ORAL | Status: DC
  Administered 2020-09-05 – 2020-09-06 (×2): 5 mg via ORAL
  Filled 2020-09-05 (×2): qty 1

## 2020-09-05 MED ORDER — LEVOTHYROXINE SODIUM 50 MCG PO TABS
50.0000 ug | ORAL_TABLET | Freq: Every day | ORAL | Status: DC
  Administered 2020-09-06: 50 ug via ORAL
  Filled 2020-09-05: qty 1

## 2020-09-05 MED ORDER — PANTOPRAZOLE SODIUM 40 MG IV SOLR: 40.0000 mg | Freq: Two times a day (BID) | INTRAVENOUS | Status: DC

## 2020-09-05 MED ORDER — DUTASTERIDE 0.5 MG PO CAPS
0.5000 mg | ORAL_CAPSULE | Freq: Every day | ORAL | Status: DC
  Administered 2020-09-06: 0.5 mg via ORAL
  Filled 2020-09-05 (×2): qty 1

## 2020-09-05 MED ORDER — IPRATROPIUM-ALBUTEROL 0.5-2.5 (3) MG/3ML IN SOLN
3.0000 mL | Freq: Four times a day (QID) | RESPIRATORY_TRACT | Status: DC
  Administered 2020-09-05 – 2020-09-06 (×4): 3 mL via RESPIRATORY_TRACT
  Filled 2020-09-05 (×4): qty 3

## 2020-09-05 MED ORDER — URO-MP 118 MG PO CAPS: 118.0000 mg | ORAL_CAPSULE | Freq: Every day | ORAL | Status: DC | PRN

## 2020-09-05 MED ORDER — SODIUM CHLORIDE 0.9% FLUSH
3.0000 mL | Freq: Two times a day (BID) | INTRAVENOUS | Status: DC
  Administered 2020-09-05 (×2): 3 mL via INTRAVENOUS

## 2020-09-05 MED ORDER — GABAPENTIN 300 MG PO CAPS
300.0000 mg | ORAL_CAPSULE | Freq: Three times a day (TID) | ORAL | Status: DC
  Administered 2020-09-05 – 2020-09-06 (×3): 300 mg via ORAL
  Filled 2020-09-05 (×3): qty 1

## 2020-09-05 MED ORDER — HYDROCODONE-ACETAMINOPHEN 5-325 MG PO TABS
1.0000 | ORAL_TABLET | Freq: Four times a day (QID) | ORAL | Status: DC | PRN
  Administered 2020-09-05: 1 via ORAL
  Filled 2020-09-05: qty 1

## 2020-09-05 MED ORDER — PRAVASTATIN SODIUM 40 MG PO TABS
40.0000 mg | ORAL_TABLET | ORAL | Status: DC
  Administered 2020-09-05: 40 mg via ORAL
  Filled 2020-09-05 (×2): qty 1

## 2020-09-05 MED ORDER — CLOPIDOGREL BISULFATE 75 MG PO TABS
75.0000 mg | ORAL_TABLET | Freq: Every day | ORAL | Status: DC
  Administered 2020-09-06: 75 mg via ORAL
  Filled 2020-09-05: qty 1

## 2020-09-05 MED ORDER — PREDNISONE 20 MG PO TABS
40.0000 mg | ORAL_TABLET | Freq: Every day | ORAL | Status: DC
  Administered 2020-09-06: 40 mg via ORAL
  Filled 2020-09-05: qty 2

## 2020-09-05 MED ORDER — TEMAZEPAM 7.5 MG PO CAPS
7.5000 mg | ORAL_CAPSULE | Freq: Every evening | ORAL | Status: DC | PRN
  Administered 2020-09-05: 7.5 mg via ORAL
  Filled 2020-09-05: qty 1

## 2020-09-05 MED ORDER — SODIUM CHLORIDE 0.9% IV SOLUTION: Freq: Once | INTRAVENOUS | Status: DC

## 2020-09-05 MED ORDER — PANTOPRAZOLE SODIUM 40 MG IV SOLR: 40.0000 mg | INTRAVENOUS | Status: DC

## 2020-09-05 NOTE — ED Triage Notes (Signed)
Pt sent from PCP for change in hemoglobin (9.0 on Thursday to 7 yesterday). Reports dark stool. Endorses generalized weakness and dizziness. Also reports cramping chest pain this morning, which has resolved.

## 2020-09-05 NOTE — H&P (Signed)
History and Physical    John [name] ZOX:[id number] DOB: [birth year] DOA: 09/05/2020  Referring MD/NP/PA: Edison Simon, MD (resident) PCP: [name], FNP  Patient coming from: Sent from PCP of  Chief Complaint: Low hemoglobin  I have personally briefly reviewed patient's old medical records in [place]   HPI: John [name] is a 75 y.o. male with medical history significant of hypertension, dyslipidemia, diastolic CHF, atrial fibrillation on Eliquis, CAD, COPD, chronic respiratory failure on 2-3 L of oxygen as needed, PVD, diabetes mellitus type 2, hypothyroidism non-small cell lung cancer s/p chemotherapy with radiation, and BPH who was sent from his primary care provider office for low hemoglobin.  Patient reports that he had not been feeling well since he was hospitalized from 5/16 and underwent angioplasty and stenting of the right common iliac artery with right common femoral endarterectomy with profundoplasty and bovine pericardial patch angioplasty.  Since getting out of the hospital patient reports that he has been feeling weak and has been having dark brown stools.  Notes associated symptoms of shortness of breath, intermittent productive cough with clear sputum production, wheezing, scrotal swelling, and wife states that he is pale in color.  Following the procedure he had been started on Plavix and had taken the medication open till about 3-4 days ago when he self discontinued it because it he felt it was making him feel cold.  He reports that he takes meloxicam infrequently for pain in his back.  Normally patient only uses his oxygen intermittently while at home and states that he does not use it 24/7.  Due to the symptoms he went to go visit his primary care provider on 08/30/2020 and was noted to have hemoglobin of 9.4 g/dL.  He had a follow-up appointment today for repeat check of his blood counts where his hemoglobin had been noted to drop down to 7.3.  Patient reported  having some left-sided chest pain that felt like a cramp earlier today, but states that those symptoms have since resolved.  He denies having any gross blood in his stools, fever,vomiting, or diarrhea.  Patient also notes that he is scheduled to have radiation therapy on June 6, 8, and the 13th after recent PET scan from 5/9 showed further progression of bilateral upper lobe hypermetabolic nodules in metastatic disease.  ED Course: Upon admission to the emergency department patient was seen to be afebrile, pulse 85-114, blood pressures maintained, and O2 saturations on 3 L.  Labs significant for hemoglobin 8 g/dL, platelets 551, sodium 134, BUN 18, creatinine 1.19, glucose 165, and INR 1.1.  Stool guaiacs were noted to be positive.  [facility] was formally consulted.  Patient was typed and screened for possible need of blood products.  TRH called to admit.  Review of Systems  Constitutional: Positive for malaise/fatigue. Negative for fever.  HENT: Negative for ear discharge and nosebleeds.   Eyes: Negative for photophobia and pain.  Respiratory: Positive for cough, sputum production, shortness of breath and wheezing.   Cardiovascular: Positive for chest pain and leg swelling.  Gastrointestinal: Positive for nausea. Negative for abdominal pain, blood in stool and vomiting.  Genitourinary: Negative for dysuria and hematuria.  Musculoskeletal: Positive for back pain.  Skin: Negative for itching and rash.  Neurological: Negative for loss of consciousness.  Psychiatric/Behavioral: Positive for substance abuse.    Past Medical History:  Diagnosis Date  . Adenocarcinoma of lung, right ([place]) 04/18/2016  . Anxiety   . Arthritis   . Asthma   .  BPH (benign prostatic hyperplasia)    with urinary retention 02/06/20  . CHF (congestive heart failure) ([place])   . COPD (chronic obstructive pulmonary disease) ([place])   . Depression   . Diabetes mellitus without complication (HCC)    no meds  . Diabetes  mellitus, type II ([place])   . DM (diabetes mellitus) ([place]) 07/09/2016  . Dyspnea   . History of kidney stones   . History of radiation therapy 06/25/16-08/20/16   right lung  . Hyperlipidemia   . Hypertension   . Hypertension   . Hypothyroidism   . Macular degeneration   . Neuropathy   . Non-small cell lung cancer, right ([place]) 04/18/2016  . Peripheral vascular disease ([place])   . Prostatitis   . Pulmonary nodule, left 07/16/2016  . Sleep apnea    cpap    Past Surgical History:  Procedure Laterality Date  . ABDOMINAL AORTOGRAM W/LOWER EXTREMITY Left 02/06/2020   Procedure: ABDOMINAL AORTOGRAM W/LOWER EXTREMITY;  Surgeon: Lorretta Harp, MD;  Location: [facility];  Service: Cardiovascular;  Laterality: Left;  . CATARACT EXTRACTION, BILATERAL Bilateral   . ENDARTERECTOMY FEMORAL Right 08/20/2020   Procedure: ENDARTERECTOMY  RIGHT FEMORAL ARTERY;  Surgeon: Marty Heck, MD;  Location: [facility];  Service: Vascular;  Laterality: Right;  . INSERTION OF ILIAC STENT Right 08/20/2020   Procedure: RETROGRADE INSERTION OF RIGHT ILIAC STENT;  Surgeon: Marty Heck, MD;  Location: [facility];  Service: Vascular;  Laterality: Right;  . INTRAOPERATIVE ARTERIOGRAM Right 08/20/2020   Procedure: INTRA OPERATIVE ARTERIOGRAM ILIAC;  Surgeon: Marty Heck, MD;  Location: [facility];  Service: Vascular;  Laterality: Right;  . PATCH ANGIOPLASTY Right 08/20/2020   Procedure: PATCH ANGIOPLASTY RIGHT FEMORAL ARTERY;  Surgeon: Marty Heck, MD;  Location: [facility];  Service: Vascular;  Laterality: Right;  . PORTACATH PLACEMENT Left 06/13/2016   Procedure: INSERTION PORT-A-CATH;  Surgeon: Aviva Signs, MD;  Location: [facility];  Service: General;  Laterality: Left;  . TRANSURETHRAL RESECTION OF PROSTATE N/A 05/31/2020   Procedure: TRANSURETHRAL RESECTION OF THE PROSTATE (TURP);  Surgeon: Cleon Gustin, MD;  Location: [facility];  Service: Urology;  Laterality: N/A;  . VIDEO BRONCHOSCOPY WITH  ENDOBRONCHIAL NAVIGATION N/A 05/28/2016   Procedure: VIDEO BRONCHOSCOPY WITH ENDOBRONCHIAL NAVIGATION;  Surgeon: [name], MD;  Location: [facility];  Service: Thoracic;  Laterality: N/A;  . VIDEO BRONCHOSCOPY WITH ENDOBRONCHIAL ULTRASOUND N/A 05/28/2016   Procedure: VIDEO BRONCHOSCOPY WITH ENDOBRONCHIAL ULTRASOUND;  Surgeon: [name], MD;  Location: [facility];  Service: Thoracic;  Laterality: N/A;     reports that he has been smoking cigarettes. He started smoking about 59 years ago. He has a 27.50 pack-year smoking history. He has never used smokeless tobacco. He reports that he does not drink alcohol and does not use drugs.  Allergies  Allergen Reactions  . Jardiance [Empagliflozin] Other (See Comments)    FEELS SLUGGISH, TIRED  . Lopressor [Metoprolol] Other (See Comments)    Fatigue  . Atorvastatin     Muscle aches - tolerating Pravastatin 40 mg MWF  . Rosuvastatin     Muscle aches - tolerating Pravastatin 40 mg MWF  . Sulfa Antibiotics Swelling    Mouth swelling    Family History  Problem Relation Age of Onset  . Hypertension Mother   . Diabetes Father   . Heart disease Father   . Stroke Father   . Hypertension Sister     Prior to Admission medications   Medication Sig  Start Date End Date Taking? Authorizing Provider  albuterol (VENTOLIN HFA) 108 (90 Base) MCG/ACT inhaler Inhale 2 puffs into the lungs every 4 (four) hours as needed for wheezing or shortness of breath. 08/08/20  Yes Hawks, Christy A, FNP  apixaban (ELIQUIS) 5 MG TABS tablet Take 1 tablet (5 mg total) by mouth 2 (two) times daily. 08/22/20  Yes Rhyne, Samantha J, PA-C  Blood Glucose Monitoring Suppl (ONETOUCH VERIO REFLECT) w/Device KIT Use to test blood sugars daily as directed. DX: E11.9 10/14/19  Yes Hawks, Christy A, FNP  cloNIDine (CATAPRES) 0.1 MG tablet TAKE 1 TABLET (0.1 MG TOTAL) BY MOUTH 3 (THREE) TIMES DAILY. 06/14/20  Yes Hawks, Christy A, FNP  clopidogrel (PLAVIX) 75 MG tablet Take 1  tablet (75 mg total) by mouth daily at 6 (six) AM. 08/22/20  Yes Rhyne, Samantha J, PA-C  dutasteride (AVODART) 0.5 MG capsule TAKE 1 CAPSULE BY MOUTH EVERY DAY Patient taking differently: Take 0.5 mg by mouth daily. 03/26/20  Yes Hawks, Christy A, FNP  ezetimibe (ZETIA) 10 MG tablet Take 1 tablet (10 mg total) by mouth daily. 08/22/20  Yes Rhyne, Samantha J, PA-C  fluticasone (FLONASE) 50 MCG/ACT nasal spray Place 1 spray into both nostrils daily as needed for allergies or rhinitis. Patient taking differently: Place 1 spray into both nostrils daily. 08/03/20  Yes Hawks, Christy A, FNP  furosemide (LASIX) 20 MG tablet Take 2 tablets (40 mg total) by mouth 2 (two) times daily. Take 40 mg in AM and 20 mg in afternoon 08/30/20  Yes Hawks, [place] A, FNP  gabapentin (NEURONTIN) 300 MG capsule TAKE 1 CAPSULE BY MOUTH THREE TIMES A DAY Patient taking differently: Take 300 mg by mouth 3 (three) times daily. 08/13/20  Yes Hawks, Christy A, FNP  HYDROcodone-acetaminophen (NORCO) 5-325 MG tablet Take 1 tablet by mouth every 6 (six) hours as needed for moderate pain. 08/21/20  Yes Rhyne, Hulen Shouts, PA-C  linaclotide (LINZESS) 145 MCG CAPS capsule Take 1 capsule (145 mcg total) by mouth daily. To regulate bowel movements 08/14/20  Yes Stacks, Cletus Gash, MD  losartan (COZAAR) 50 MG tablet Take 0.5 tablets (25 mg total) by mouth daily. 08/30/20  Yes Hawks, Christy A, FNP  meloxicam (MOBIC) 7.5 MG tablet Take 1 tablet (7.5 mg total) by mouth daily as needed for pain (back pain). 03/20/20  Yes Hawks, Christy A, FNP  metFORMIN (GLUCOPHAGE-XR) 500 MG 24 hr tablet Take 1 tablet (500 mg total) by mouth daily with breakfast. 03/20/20  Yes Hawks, Christy A, FNP  Meth-Hyo-M Barnett Hatter Phos-Ph Sal (URO-MP) 118 MG CAPS TAKE 1 CAPSULE (118 MG TOTAL) BY MOUTH 2 (TWO) TIMES DAILY AS NEEDED. Patient taking differently: Take 118 mg by mouth daily as needed (Uninary burning). 07/31/20  Yes McKenzie, Candee Furbish, MD  ondansetron (ZOFRAN-ODT) 4 MG  disintegrating tablet TAKE 1 TABLET BY MOUTH EVERY 8 HOURS AS NEEDED FOR NAUSEA AND VOMITING Patient taking differently: Take 4 mg by mouth every 8 (eight) hours as needed for vomiting or nausea. 07/24/20  Yes Hawks, Christy A, FNP  OneTouch Delica Lancets 40J MISC Use to test blood sugars daily as directed. DX: E11.9 10/14/19  Yes Hawks, Christy A, FNP  pravastatin (PRAVACHOL) 40 MG tablet TAKE 1 TABLET (40 MG TOTAL) BY MOUTH ON MONDAYS , WEDNESDAYS, AND FRIDAYS AS DIRECTED Patient taking differently: Take 40 mg by mouth See admin instructions. Take one tablet by  mouth on Monday, Wednesday and Fridays 05/11/20  Yes Hawks, [name], FNP  Semaglutide [facility]) 7  MG TABS Take 7 mg by mouth daily. 03/20/20  Yes Hawks, Christy A, FNP  temazepam (RESTORIL) 7.5 MG capsule Take 1 capsule (7.5 mg total) by mouth at bedtime as needed for sleep. 07/27/20  Yes Hawks, Christy A, FNP  glucose blood (ONETOUCH VERIO) test strip Use to test blood sugars daily as directed. DX: E11.9 10/14/19   Evelina Dun A, FNP  levothyroxine (SYNTHROID) 50 MCG tablet Take 1 tablet (50 mcg total) by mouth daily. 06/05/20 07/05/20  Heath Lark D, DO    Physical Exam:  Constitutional: NAD, calm, comfortable Vitals:   09/05/20 1245 09/05/20 1300 09/05/20 1315 09/05/20 1400  BP: 124/61 121/70 (!) 101/51 126/66  Pulse: 95 94 88 85  Resp: 19 20 18 18   Temp:      TempSrc:      SpO2: 100% 100% 100% 98%   Eyes: PERRL, lids and conjunctivae normal ENMT: Mucous membranes are moist. Posterior pharynx clear of any exudate or lesions.Normal dentition.  Neck: normal, supple, no masses, no thyromegaly Respiratory: clear to auscultation bilaterally, no wheezing, no crackles. Normal respiratory effort. No accessory muscle use.  Cardiovascular: Regular rate and rhythm, no murmurs / rubs / gallops. No extremity edema. 2+ pedal pulses. No carotid bruits.  Abdomen: no tenderness, no masses palpated. No hepatosplenomegaly. Bowel sounds positive.   Musculoskeletal: no clubbing / cyanosis. No joint deformity upper and lower extremities. Good ROM, no contractures. Normal muscle tone.  Skin: no rashes, lesions, ulcers. No induration Neurologic: CN 2-12 grossly intact. Sensation intact, DTR normal. Strength 5/5 in all 4.  Psychiatric: Normal judgment and insight. Alert and oriented x 3. Normal mood.     Labs on Admission: I have personally reviewed following labs and imaging studies  CBC: Recent Labs  Lab 08/30/20 1114 09/05/20 1103  WBC 8.2 10.4  NEUTROABS 6.4 8.2*  HGB 9.4* 8.0*  HCT 32.0* 28.7*  MCV 83 86.7  PLT 529* 017*   Basic Metabolic Panel: Recent Labs  Lab 08/30/20 1114 09/05/20 1103  NA 140 134*  K 4.6 4.0  CL 98 96*  CO2 26 28  GLUCOSE 133* 165*  BUN 19 18  CREATININE 1.04 1.19  CALCIUM 9.3 8.7*   GFR: Estimated Creatinine Clearance: 49.1 mL/min (by C-G formula based on SCr of 1.19 mg/dL). Liver Function Tests: Recent Labs  Lab 09/05/20 1103  AST 17  ALT 12  ALKPHOS 99  BILITOT 0.6  PROT 6.4*  ALBUMIN 3.1*   No results for input(s): LIPASE, AMYLASE in the last 168 hours. No results for input(s): AMMONIA in the last 168 hours. Coagulation Profile: Recent Labs  Lab 09/05/20 1103  INR 1.1   Cardiac Enzymes: No results for input(s): CKTOTAL, CKMB, CKMBINDEX, TROPONINI in the last 168 hours. BNP (last 3 results) No results for input(s): PROBNP in the last 8760 hours. HbA1C: No results for input(s): HGBA1C in the last 72 hours. CBG: No results for input(s): GLUCAP in the last 168 hours. Lipid Profile: No results for input(s): CHOL, HDL, LDLCALC, TRIG, CHOLHDL, LDLDIRECT in the last 72 hours. Thyroid Function Tests: No results for input(s): TSH, T4TOTAL, FREET4, T3FREE, THYROIDAB in the last 72 hours. Anemia Panel: No results for input(s): VITAMINB12, FOLATE, FERRITIN, TIBC, IRON, RETICCTPCT in the last 72 hours. Urine analysis:    Component Value Date/Time   COLORURINE STRAW (A)  08/14/2020 1122   APPEARANCEUR Clear 08/30/2020 1125   LABSPEC 1.008 08/14/2020 1122   PHURINE 7.0 08/14/2020 1122   GLUCOSEU Negative 08/30/2020 1125  HGBUR NEGATIVE 08/14/2020 1122   BILIRUBINUR Negative 08/30/2020 1125   [place] 08/14/2020 1122   PROTEINUR 1+ (A) 08/30/2020 1125   PROTEINUR NEGATIVE 08/14/2020 1122   NITRITE Negative 08/30/2020 1125   NITRITE NEGATIVE 08/14/2020 1122   LEUKOCYTESUR 1+ (A) 08/30/2020 1125   LEUKOCYTESUR NEGATIVE 08/14/2020 1122   Sepsis Labs: Recent Results (from the past 240 hour(s))  Microscopic Examination     Status: Abnormal   Collection Time: 08/30/20 11:25 AM   Urine  Result Value Ref Range Status   WBC, UA 6-10 (A) 0 - 5 /hpf Final   RBC 0-2 0 - 2 /hpf Final   Epithelial Cells (non renal) None seen 0 - 10 /hpf Final   Casts Present (A) None seen /lpf Final   Cast Type Hyaline casts N/A Final   Bacteria, UA Few None seen/Few Final  Urine Culture     Status: Abnormal   Collection Time: 08/30/20  1:21 PM   Specimen: Urine   UR  Result Value Ref Range Status   Urine Culture, Routine Final report (A)  Final   Organism ID, Bacteria Enterococcus faecalis (A)  Final    Comment: For Enterococcus species, aminoglycosides (except for high-level resistance screening), cephalosporins, clindamycin, and trimethoprim-sulfamethoxazole are not effective clinically. (CLSI, M100-S26, 2016) 50,000-100,000 colony forming units per mL    Antimicrobial Susceptibility Comment  Final    Comment:       ** S = Susceptible; I = Intermediate; R = Resistant **                    P = Positive; N = Negative             MICS are expressed in micrograms per mL    Antibiotic                 RSLT#1    RSLT#2    RSLT#3    RSLT#4 Ciprofloxacin                  S Levofloxacin                   S Nitrofurantoin                 S Penicillin                     S Tetracycline                   R Vancomycin                     S      Radiological  Exams on Admission:  EKG: Independently reviewed.  Atrial fibrillation 86 bpm  Assessment/Plan Symptomatic anemia secondary to GI bleed: Patient presents with hemoglobin down to 8 g/dL with guaiac positive stools.  [facility] was consulted and plan on doing EGD. Patient on Plavix, Eliquis, and uses meloxicam intermittently. -Admit to a medical telemetry bed -Follow-up anemia panel -Clear liquid diet -Follow-up CT scan of the abdomen pelvis with con -Serial monitoring of H&H every 6 hours and transfuse blood products as needed for hemoglobins less than 8 g/dL -Protonix 40 mg daily -Recommend discontinuation of meloxicam -Appreciate [facility] consultative services, we will follow for further recommendations  Respiratory failure with hypoxia secondary to COPD exacerbation: Acute on chronic.  Patient noted to have expiratory wheezes on physical exam and decreased overall aeration.  Chest x-ray  showed slight increase in degree of nodularity in apices bilaterally similar to recent PET scan. -Continuous pulse oximetry with nasal cannula oxygen maintain O2 saturation greater than 92% -Check portable chest x-ray -Solu-Medrol 125 mg x 1 dose then start prednisone 40 mg a day -DuoNebs 4 times daily -Empiric antibiotics of Rocephin IV.  Diastolic congestive heart failure: On physical exam patient with at least 1+ pitting edema of the lower extremities.  Last echocardiogram revealed EF of 60- 65% by echocardiogram last month. -Strict I&O's and daily weight -Check BNP -Lasix 20 mg IV twice daily -Reassess fluid status in a.m. and resume home p.o. Lasix dose when medically appropriate  Peripheral vascular disease: Patient  s/p right common femoral endarterectomy with profundoplasty and bovine patch angioplasty with a right common iliac stent placed on 5/16.  Patient had stopped Plavix 3 to 4 days ago. -Resume Plavix due to recent stent placement and risk of renal stent stenosis -Continue  statin  Paroxysmal atrial fibrillation on chronic anticoagulation: Patient appears to be relatively well controlled. -Continue Eliquis  Lung Cancer: Recent PET scan showed further progression of bilateral upper lobe hypermetabolic nodules in metastatic disease. -F/u with Radiation oncology   Diabetes mellitus type 2 with peripheral neuropathy: Last hemoglobin A1c was 7.5 on 09/20/2020.  Patient appears to be relatively uncontrolled, but on admission glucose 165.  He is not on any insulin at baseline. -Hypoglycemic protocol -Hold metformin and semaglutide -CBGs before every meal with sensitive SSI  Essential hypertension: On admission blood pressures noted to be as low as 101/51. Home blood pressure medications include clonidine 0.1 mg 3 times daily, furosemide 40 mg every morning /20 mg every evening, and losartan 25 mg daily. -Initially held on blood pressure medications due to initial low normal blood pressures -Continue to monitor and resume home blood pressure medications as deemed medically appropriate  Thrombocytosis: Acute.  Platelet count elevated at 551. -Continue to monitor  Dyslipidemia -Continue pravastatin and Zetia  Hypothyroidism: TSH was 0.628 on 08/21/2020. -Continue levothyroxine  Tobacco abuse: Patient still reports smoking half pack cigarettes per day on average. -Nicotine patch offered -Continue to counsel on need of cessation of tobacco use  OSA -Continue CPAP nightly  DVT prophylaxis: Eliquis Code Status: Full Family Communication: Wife updated at bedside Disposition Plan: Hopefully discharge home once medically stable Consults called: [facility] Admission status: Observation,  Norval Morton MD [facility]   If 7PM-7AM, please contact night-coverage   09/05/2020, 2:27 PM

## 2020-09-05 NOTE — ED Notes (Signed)
Informed RN Delsa Sale of patient's Loletha Grayer notice.

## 2020-09-05 NOTE — H&P (View-Only) (Signed)
Referring Provider: Dr. Edison Simon  Primary Care Physician:  [name], FNP Primary Gastroenterologist:  Althia Forts   Reason for Consultation:  Anemia. GI bleed.   HPI: John [name] is a 75 y.o. male with a past medical history of anxiety, depression, arthritis, asthma, COPD,  non-small cell ung cancer diagnosed 08/2016 s/p radiation and chemo in 2018, Durvalumab immunotherapy 2018 with enlarging bilateral pulmonary nodules scheduled for SBRT 09/2020, sleep apnea, hypertension, CHF with EF 60 -65%, CAD (coronary calcifications on CT), hyperlipidemia, hypothyroidism, diabetes mellitus type 2, kidney stones and anemia. S/P  right common femoral endarterectomy with profundoplasty and bovine patch angioplasty with a right common iliac stent on Aug 20, 2020 for short distance lifestyle limiting claudication started on Plavix.  He developed postoperative atrial flutter/flutter and he was placed on Eliquis and Metoprolol.   He was seen by his Karren Cobble NP in the outpatient clinic earlier today for anemia follow-up.  In review of his laboratory studies it appears as if he has had normocytic anemia since at least January 2022. His hemoglobin level was 9.4 on 08/30/2020 which dropped down to 7.3 gms today.  He reported feeling weak with associated chest tightness and shortness of breath so he was sent directly to [facility] ED for further evaluation. Labs in the ED showed a Hemoglobin level 8.0.  Hematocrit 28.7.  MCV 86.7.  Platelet 551.  BUN 18.  Creatinine 1.19.  Normal LFTs.  INR 1.1.  FOBT positive  05/05/2020 Hg 14.3 05/06/2020 Hg 12.8 05/24/2020 Hg 13.1 05/31/2020 Hg 11.4 06/02/2020 Hg 10 06/12/2020 Hg 10.4 08/03/2020 Hg 10.8 08/14/2020 Hg 11.6 08/30/2020 Hg 9.4 09/05/2020 Hg 7.3  He complains of having nausea for 1 to 2 years.  No dysphagia or heartburn.  No upper or lower abdominal pain.  He typically passes a solid brown stool most days.  He intermittently takes Linzess which  results in diarrhea.  Yesterday he noted his stool was a darker brown.  He denies seeing any black stool.  No rectal bleeding.  His last dose of Eliquis was at 10 AM on 09/04/2020.  He stopped taking Plavix 3 or 4 days ago because he felt "cold" while he took it.  Aspirin was discontinued on 08/22/2020.  He takes Meloxicam 7.5 mg 1 tab 2 to 3 days monthly for back pain.  He denies any past history of a GI bleed.  He reported undergoing a colonoscopy approximately 10 years ago at [facility] which he reported was normal.  No known family history of esophageal, gastric or colon cancer.  Reported having lower sternal chest tightness earlier this morning which lasted for 2 to 3 hours then abated.  He also had mild shortness of breath which resolved while in the ED.  He is now on oxygen 2 L nasal cannula.  Intermittent cough.  No hemoptysis.  He denies alcohol or drug use.  He smokes 1/2 pack of cigarettes every other day, previously smoked 2 packs/day.  His significant other and POA [name] is at the bedside.   TTE 08/21/2020 1. Left ventricular ejection fraction, by estimation, is 60 to 65%. The  left ventricle has normal function. The left ventricle has no regional  wall motion abnormalities. Left ventricular diastolic parameters are  indeterminate.  2. Right ventricular systolic function is mildly reduced. The right  ventricular size is normal.  3. The mitral valve is abnormal. No evidence of mitral valve  regurgitation. The mean mitral valve gradient  is 4.0 mmHg with average  heart rate of 87 bpm. Moderate mitral annular calcification.  4. The aortic valve is calcified. Aortic valve regurgitation is not  visualized. Mild to moderate aortic valve sclerosis/calcification is  present. Aortic valve area, by VTI measures 0.91 cm. Aortic valve mean  gradient measures 8.0 mmHg. Aortic valve  Vmax measures 1.88 m/s. Aortic valve acceleration time measures 97 msec.  There is  discordance between the AVA and gradients; DVI is 0.36 with LV  stroke volume index of 30; Aortic stenosis may be underestimated in this  study. Consider outpatient  monitoring if clinically indicated    Past Medical History:  Diagnosis Date  . Adenocarcinoma of lung, right ([place]) 04/18/2016  . Anxiety   . Arthritis   . Asthma   . BPH (benign prostatic hyperplasia)    with urinary retention 02/06/20  . CHF (congestive heart failure) ([place])   . COPD (chronic obstructive pulmonary disease) ([place])   . Depression   . Diabetes mellitus without complication (HCC)    no meds  . Diabetes mellitus, type II ([place])   . DM (diabetes mellitus) ([place]) 07/09/2016  . Dyspnea   . History of kidney stones   . History of radiation therapy 06/25/16-08/20/16   right lung  . Hyperlipidemia   . Hypertension   . Hypertension   . Hypothyroidism   . Macular degeneration   . Neuropathy   . Non-small cell lung cancer, right ([place]) 04/18/2016  . Peripheral vascular disease ([place])   . Prostatitis   . Pulmonary nodule, left 07/16/2016  . Sleep apnea    cpap    Past Surgical History:  Procedure Laterality Date  . ABDOMINAL AORTOGRAM W/LOWER EXTREMITY Left 02/06/2020   Procedure: ABDOMINAL AORTOGRAM W/LOWER EXTREMITY;  Surgeon: Lorretta Harp, MD;  Location: [place] [facility];  Service: Cardiovascular;  Laterality: Left;  . CATARACT EXTRACTION, BILATERAL Bilateral   . ENDARTERECTOMY FEMORAL Right 08/20/2020   Procedure: ENDARTERECTOMY  RIGHT FEMORAL ARTERY;  Surgeon: Marty Heck, MD;  Location: [place];  Service: Vascular;  Laterality: Right;  . INSERTION OF ILIAC STENT Right 08/20/2020   Procedure: RETROGRADE INSERTION OF RIGHT ILIAC STENT;  Surgeon: Marty Heck, MD;  Location: [place];  Service: Vascular;  Laterality: Right;  . INTRAOPERATIVE ARTERIOGRAM Right 08/20/2020   Procedure: INTRA OPERATIVE ARTERIOGRAM ILIAC;  Surgeon: Marty Heck, MD;  Location: [place];  Service: Vascular;   Laterality: Right;  . PATCH ANGIOPLASTY Right 08/20/2020   Procedure: PATCH ANGIOPLASTY RIGHT FEMORAL ARTERY;  Surgeon: Marty Heck, MD;  Location: [place];  Service: Vascular;  Laterality: Right;  . PORTACATH PLACEMENT Left 06/13/2016   Procedure: INSERTION PORT-A-CATH;  Surgeon: Aviva Signs, MD;  Location: [facility];  Service: General;  Laterality: Left;  . TRANSURETHRAL RESECTION OF PROSTATE N/A 05/31/2020   Procedure: TRANSURETHRAL RESECTION OF THE PROSTATE (TURP);  Surgeon: Cleon Gustin, MD;  Location: [facility];  Service: Urology;  Laterality: N/A;  . VIDEO BRONCHOSCOPY WITH ENDOBRONCHIAL NAVIGATION N/A 05/28/2016   Procedure: VIDEO BRONCHOSCOPY WITH ENDOBRONCHIAL NAVIGATION;  Surgeon: [name], MD;  Location: [place];  Service: Thoracic;  Laterality: N/A;  . VIDEO BRONCHOSCOPY WITH ENDOBRONCHIAL ULTRASOUND N/A 05/28/2016   Procedure: VIDEO BRONCHOSCOPY WITH ENDOBRONCHIAL ULTRASOUND;  Surgeon: [name], MD;  Location: [place];  Service: Thoracic;  Laterality: N/A;    Prior to Admission medications   Medication Sig Start Date End Date Taking? Authorizing Provider  albuterol (VENTOLIN HFA) 108 (90 Base) MCG/ACT  inhaler Inhale 2 puffs into the lungs every 4 (four) hours as needed for wheezing or shortness of breath. 08/08/20  Yes Hawks, Christy A, FNP  apixaban (ELIQUIS) 5 MG TABS tablet Take 1 tablet (5 mg total) by mouth 2 (two) times daily. 08/22/20  Yes Rhyne, Samantha J, PA-C  Blood Glucose Monitoring Suppl (ONETOUCH VERIO REFLECT) w/Device KIT Use to test blood sugars daily as directed. DX: E11.9 10/14/19  Yes Hawks, Christy A, FNP  cloNIDine (CATAPRES) 0.1 MG tablet TAKE 1 TABLET (0.1 MG TOTAL) BY MOUTH 3 (THREE) TIMES DAILY. 06/14/20  Yes Hawks, Christy A, FNP  clopidogrel (PLAVIX) 75 MG tablet Take 1 tablet (75 mg total) by mouth daily at 6 (six) AM. 08/22/20  Yes Rhyne, Samantha J, PA-C  dutasteride (AVODART) 0.5 MG capsule TAKE 1 CAPSULE BY MOUTH EVERY DAY Patient  taking differently: Take 0.5 mg by mouth daily. 03/26/20  Yes Hawks, Christy A, FNP  ezetimibe (ZETIA) 10 MG tablet Take 1 tablet (10 mg total) by mouth daily. 08/22/20  Yes Rhyne, Samantha J, PA-C  fluticasone (FLONASE) 50 MCG/ACT nasal spray Place 1 spray into both nostrils daily as needed for allergies or rhinitis. Patient taking differently: Place 1 spray into both nostrils daily. 08/03/20  Yes Hawks, Christy A, FNP  furosemide (LASIX) 20 MG tablet Take 2 tablets (40 mg total) by mouth 2 (two) times daily. Take 40 mg in AM and 20 mg in afternoon 08/30/20  Yes Hawks, Rosenhayn A, FNP  gabapentin (NEURONTIN) 300 MG capsule TAKE 1 CAPSULE BY MOUTH THREE TIMES A DAY Patient taking differently: Take 300 mg by mouth 3 (three) times daily. 08/13/20  Yes Hawks, Christy A, FNP  HYDROcodone-acetaminophen (NORCO) 5-325 MG tablet Take 1 tablet by mouth every 6 (six) hours as needed for moderate pain. 08/21/20  Yes Rhyne, Hulen Shouts, PA-C  linaclotide (LINZESS) 145 MCG CAPS capsule Take 1 capsule (145 mcg total) by mouth daily. To regulate bowel movements 08/14/20  Yes Stacks, Cletus Gash, MD  losartan (COZAAR) 50 MG tablet Take 0.5 tablets (25 mg total) by mouth daily. 08/30/20  Yes Hawks, Christy A, FNP  meloxicam (MOBIC) 7.5 MG tablet Take 1 tablet (7.5 mg total) by mouth daily as needed for pain (back pain). 03/20/20  Yes Hawks, Christy A, FNP  metFORMIN (GLUCOPHAGE-XR) 500 MG 24 hr tablet Take 1 tablet (500 mg total) by mouth daily with breakfast. 03/20/20  Yes Hawks, Christy A, FNP  Meth-Hyo-M Barnett Hatter Phos-Ph Sal (URO-MP) 118 MG CAPS TAKE 1 CAPSULE (118 MG TOTAL) BY MOUTH 2 (TWO) TIMES DAILY AS NEEDED. Patient taking differently: Take 118 mg by mouth daily as needed (Uninary burning). 07/31/20  Yes McKenzie, Candee Furbish, MD  ondansetron (ZOFRAN-ODT) 4 MG disintegrating tablet TAKE 1 TABLET BY MOUTH EVERY 8 HOURS AS NEEDED FOR NAUSEA AND VOMITING Patient taking differently: Take 4 mg by mouth every 8 (eight) hours as needed  for vomiting or nausea. 07/24/20  Yes Hawks, Christy A, FNP  OneTouch Delica Lancets 96E MISC Use to test blood sugars daily as directed. DX: E11.9 10/14/19  Yes Hawks, Christy A, FNP  pravastatin (PRAVACHOL) 40 MG tablet TAKE 1 TABLET (40 MG TOTAL) BY MOUTH ON MONDAYS , WEDNESDAYS, AND FRIDAYS AS DIRECTED Patient taking differently: Take 40 mg by mouth See admin instructions. Take one tablet by  mouth on Monday, Wednesday and Fridays 05/11/20  Yes Hawks, Christy A, FNP  Semaglutide (RYBELSUS) 7 MG TABS Take 7 mg by mouth daily. 03/20/20  Yes [name], FNP  temazepam (RESTORIL) 7.5 MG capsule Take 1 capsule (7.5 mg total) by mouth at bedtime as needed for sleep. 07/27/20  Yes Hawks, Christy A, FNP  glucose blood (ONETOUCH VERIO) test strip Use to test blood sugars daily as directed. DX: E11.9 10/14/19   Evelina Dun A, FNP  levothyroxine (SYNTHROID) 50 MCG tablet Take 1 tablet (50 mcg total) by mouth daily. 06/05/20 07/05/20  Manuella Ghazi, Pratik D, DO    No current facility-administered medications for this encounter.   Current Outpatient Medications  Medication Sig Dispense Refill  . albuterol (VENTOLIN HFA) 108 (90 Base) MCG/ACT inhaler Inhale 2 puffs into the lungs every 4 (four) hours as needed for wheezing or shortness of breath. 8 g 6  . apixaban (ELIQUIS) 5 MG TABS tablet Take 1 tablet (5 mg total) by mouth 2 (two) times daily. 60 tablet 3  . Blood Glucose Monitoring Suppl (ONETOUCH VERIO REFLECT) w/Device KIT Use to test blood sugars daily as directed. DX: E11.9 1 kit 1  . cloNIDine (CATAPRES) 0.1 MG tablet TAKE 1 TABLET (0.1 MG TOTAL) BY MOUTH 3 (THREE) TIMES DAILY. 270 tablet 0  . clopidogrel (PLAVIX) 75 MG tablet Take 1 tablet (75 mg total) by mouth daily at 6 (six) AM. 30 tablet 11  . dutasteride (AVODART) 0.5 MG capsule TAKE 1 CAPSULE BY MOUTH EVERY DAY (Patient taking differently: Take 0.5 mg by mouth daily.) 90 capsule 1  . ezetimibe (ZETIA) 10 MG tablet Take 1 tablet (10 mg total) by  mouth daily. 30 tablet 1  . fluticasone (FLONASE) 50 MCG/ACT nasal spray Place 1 spray into both nostrils daily as needed for allergies or rhinitis. (Patient taking differently: Place 1 spray into both nostrils daily.) 18.2 mL 2  . furosemide (LASIX) 20 MG tablet Take 2 tablets (40 mg total) by mouth 2 (two) times daily. Take 40 mg in AM and 20 mg in afternoon 180 tablet 2  . gabapentin (NEURONTIN) 300 MG capsule TAKE 1 CAPSULE BY MOUTH THREE TIMES A DAY (Patient taking differently: Take 300 mg by mouth 3 (three) times daily.) 270 capsule 0  . HYDROcodone-acetaminophen (NORCO) 5-325 MG tablet Take 1 tablet by mouth every 6 (six) hours as needed for moderate pain. 20 tablet 0  . linaclotide (LINZESS) 145 MCG CAPS capsule Take 1 capsule (145 mcg total) by mouth daily. To regulate bowel movements 30 capsule 5  . losartan (COZAAR) 50 MG tablet Take 0.5 tablets (25 mg total) by mouth daily. 45 tablet 3  . meloxicam (MOBIC) 7.5 MG tablet Take 1 tablet (7.5 mg total) by mouth daily as needed for pain (back pain). 90 tablet 0  . metFORMIN (GLUCOPHAGE-XR) 500 MG 24 hr tablet Take 1 tablet (500 mg total) by mouth daily with breakfast. 90 tablet 1  . Meth-Hyo-M Bl-Na Phos-Ph Sal (URO-MP) 118 MG CAPS TAKE 1 CAPSULE (118 MG TOTAL) BY MOUTH 2 (TWO) TIMES DAILY AS NEEDED. (Patient taking differently: Take 118 mg by mouth daily as needed (Uninary burning).) 30 capsule 3  . ondansetron (ZOFRAN-ODT) 4 MG disintegrating tablet TAKE 1 TABLET BY MOUTH EVERY 8 HOURS AS NEEDED FOR NAUSEA AND VOMITING (Patient taking differently: Take 4 mg by mouth every 8 (eight) hours as needed for vomiting or nausea.) 20 tablet 0  . OneTouch Delica Lancets 35T MISC Use to test blood sugars daily as directed. DX: E11.9 100 each 1  . pravastatin (PRAVACHOL) 40 MG tablet TAKE 1 TABLET (40 MG TOTAL) BY MOUTH ON MONDAYS , WEDNESDAYS, AND FRIDAYS AS DIRECTED (Patient  taking differently: Take 40 mg by mouth See admin instructions. Take one tablet  by  mouth on Monday, Wednesday and Fridays) 48 tablet 3  . Semaglutide (RYBELSUS) 7 MG TABS Take 7 mg by mouth daily. 90 tablet 1  . temazepam (RESTORIL) 7.5 MG capsule Take 1 capsule (7.5 mg total) by mouth at bedtime as needed for sleep. 30 capsule 1  . glucose blood (ONETOUCH VERIO) test strip Use to test blood sugars daily as directed. DX: E11.9 100 each 12  . levothyroxine (SYNTHROID) 50 MCG tablet Take 1 tablet (50 mcg total) by mouth daily. 30 tablet 2    Allergies as of 09/05/2020 - Review Complete 09/05/2020  Allergen Reaction Noted  . Jardiance [empagliflozin] Other (See Comments) 09/23/2019  . Lopressor [metoprolol] Other (See Comments) 09/21/2019  . Atorvastatin  08/21/2020  . Rosuvastatin  08/21/2020  . Sulfa antibiotics Swelling 06/18/2020    Family History  Problem Relation Age of Onset  . Hypertension Mother   . Diabetes Father   . Heart disease Father   . Stroke Father   . Hypertension Sister     Social History   Socioeconomic History  . Marital status: Divorced    Spouse name: Not on file  . Number of children: 5  . Years of education: 10  . Highest education level: 10th grade  Occupational History  . Occupation: Retired  Tobacco Use  . Smoking status: Current Every Day Smoker    Packs/day: 0.50    Years: 55.00    Pack years: 27.50    Types: Cigarettes    Start date: 03/11/1961  . Smokeless tobacco: Never Used  . Tobacco comment: 1/2 pack to 1 pack per day 12/14/19  Vaping Use  . Vaping Use: Never used  Substance and Sexual Activity  . Alcohol use: No  . Drug use: No  . Sexual activity: Yes    Birth control/protection: None  Other Topics Concern  . Not on file  Social History Narrative  . Not on file   Social Determinants of Health   Financial Resource Strain: Not on file  Food Insecurity: Not on file  Transportation Needs: Not on file  Physical Activity: Not on file  Stress: Not on file  Social Connections: Not on file  Intimate Partner  Violence: Not on file    Review of Systems: Gen: Denies fever, sweats or chills. No weight loss.  CV: Denies chest pain, palpitations or edema. Resp: Denies cough, shortness of breath of hemoptysis.  GI: See HPI. GU : + Recent UTI. Denies urinary burning, blood in urine, increased urinary frequency or incontinence. MS: + Generalized weakness.  Derm: Denies rash, itchiness, skin lesions or unhealing ulcers. Psych: Denies depression, anxiety or memory loss. Heme: Denies easy bruising, bleeding. Neuro:  Denies headaches, dizziness or paresthesias. Endo:  + DM II.   Physical Exam: Vital signs in last 24 hours: Temp:  [98.2 F (36.8 C)-98.7 F (37.1 C)] 98.2 F (36.8 C) (06/01 1045) Pulse Rate:  [86-114] 110 (06/01 1215) Resp:  [16-22] 22 (06/01 1215) BP: (123-141)/(63-65) 123/63 (06/01 1215) SpO2:  [95 %-100 %] 100 % (06/01 1215) Weight:  [75.9 kg] 75.9 kg (06/01 0817)   General:  Alert fatigued appearing 75 year old male in no acute distress. His is HOH. Head:  Normocephalic and atraumatic. Eyes:  No scleral icterus. Conjunctiva pink. Ears:  Normal auditory acuity. Nose:  No deformity, discharge or lesions. Mouth: Upper and lower dentures intact.  No ulcers or lesions.  Neck:  Supple. No lymphadenopathy or thyromegaly.  Lungs: Breath sounds with scattered expiratory wheezes throughout. Heart: Regular rate and rhythm at this time, no murmur. Abdomen: Soft, mildly distended but soft.  Positive bowel sounds all 4 quadrants.  No masses.  No HSM. Rectal: Non-inflamed external hemorrhoids. Scant amount of dark brown stool in the rectum. + FOBT.  Musculoskeletal:  Symmetrical without gross deformities.  Pulses:  Normal pulses noted. Extremities:  Bilateral LEs withw 1+ edema.  Neurologic:  Alert and  oriented x 4. No focal deficits.  Skin:  Intact without significant lesions or rashes. Psych:  Alert and cooperative. Normal mood and affect.  Intake/Output from previous day: No  intake/output data recorded. Intake/Output this shift: No intake/output data recorded.  Lab Results: Recent Labs    09/05/20 1103  WBC 10.4  HGB 8.0*  HCT 28.7*  PLT 551*   BMET Recent Labs    09/05/20 1103  NA 134*  K 4.0  CL 96*  CO2 28  GLUCOSE 165*  BUN 18  CREATININE 1.19  CALCIUM 8.7*   LFT Recent Labs    09/05/20 1103  PROT 6.4*  ALBUMIN 3.1*  AST 17  ALT 12  ALKPHOS 99  BILITOT 0.6   PT/INR Recent Labs    09/05/20 1103  LABPROT 14.6  INR 1.1   Hepatitis Panel No results for input(s): HEPBSAG, HCVAB, HEPAIGM, HEPBIGM in the last 72 hours.    Studies/Results: No results found.  IMPRESSION/PLAN:  93. 75 year old male with normocytic anemia and + FOBT.  Hg 9.4 on 5/26 -> 7.3 on 09/05/2020. Dark brown colored stools. No rectal bleeding.  -Clear liquid diet -EGD and colonoscopy, timing to be verified by Dr. Henrene Pastor. Note patient stated he stopped taking Plavix 3 or 4 days ago because he felt cold after taking it. (Per cardiologist Dr. Audie Box patient should still be on Plavix s/p right iliac stent placement).  -EGD and colonoscopy benefits and risks discussed including risk with sedation, risk of bleeding, perforation and infection. Check H/H Q 6hrs x 24 hours -Recommend transfusing for Hg < 8 -PPI IV BID -CBC, iron panel and B12 in am -Await further recommendations per Dr. Henrene Pastor  2. S/P right common femoral endarterectomy with profundoplasty and bovine patch angioplasty with a right common iliac stent 08/20/2020 started on Plavix. Patient reported he stopped taking Plavix 3 or 4 days ago.  -Recommend vascular consult   3. Afib/flutter on Eliquis. Last dose of Eliquis was at 10 am on 09/04/2020  4. Non-small cell lung cancer s/p radiation and chemo 2018 with enlarging bilateral pulmonary nodules scheduled for SBRT  09/2020  5. COPD  6. Sleep apnea  7. Non-obstructing CAD per CT. CHF with LV EF 60 -65%   Noralyn Pick  09/05/2020, 12:54  PM   GI ATTENDING  History, laboratories, x-rays reviewed.  Patient seen and examined.  Agree with comprehensive consultation note as outlined above.  Complex patient with multiple active medical problems as outlined above.  Also multiple significant problems as part of his medical history.  We are asked to see for interval anemia.  Suspect that the significant change in his hemoglobin recently was related to vascular intervention.  He has been on antiplatelet and oral anticoagulation therapies.  His stool is Hemoccult positive with a background history of reporting dark stools.  No overt bleeding.  He is at risk for upper GI mucosal lesions.  He did have colonoscopy 10 years ago elsewhere without significant abnormalities, per his report.  His vascular interventions were recent and he needs his antiplatelet therapy as well as oral anticoagulation therapy for his atrial arrhythmia.  My recommendations are as follows:  1.  Transfuse to clinically appropriate hemoglobin 2.  Twice daily PPI therapy 3.  Schedule upper endoscopy tomorrow.  Scheduled for 10:45 AM.  The patient is HIGH RISK GIVEN HIS COMORBIDITIES AND ANTIPLATELET/ANTICOAGULATION THERAPIES.The nature of the procedure, as well as the risks, benefits, and alternatives were carefully and thoroughly reviewed with the patient. Ample time for discussion and questions allowed. The patient understood, was satisfied, and agreed to proceed. 4.  CT scan of the abdomen and pelvis to rule out retroperitoneal hemorrhage.  As well rule out any significant intracolonic pathology (not a good colonoscopy candidate at this particular time) that might necessitate early colonoscopy.  If negative, colonoscopy a few months down the road could be considered after current acute problems have settled.  5.  FOLLOW-UP ANEMIA PANEL  Docia Chuck. Geri Seminole., M.D. [facility] Division of Gastroenterology

## 2020-09-05 NOTE — Progress Notes (Signed)
Trailed male purewick on patient. He was unable to urinate properly with purewick, stated he cannot urinate while laying in bed. Pt assisted to [facility] to urinate.

## 2020-09-05 NOTE — Consult Note (Addendum)
Referring Provider: Dr. Edison Simon  Primary Care Physician:  [name], FNP Primary Gastroenterologist:  Althia Forts   Reason for Consultation:  Anemia. GI bleed.   HPI: John [name] is a 75 y.o. male with a past medical history of anxiety, depression, arthritis, asthma, COPD,  non-small cell ung cancer diagnosed 08/2016 s/p radiation and chemo in 2018, Durvalumab immunotherapy 2018 with enlarging bilateral pulmonary nodules scheduled for SBRT 09/2020, sleep apnea, hypertension, CHF with EF 60 -65%, CAD (coronary calcifications on CT), hyperlipidemia, hypothyroidism, diabetes mellitus type 2, kidney stones and anemia. S/P  right common femoral endarterectomy with profundoplasty and bovine patch angioplasty with a right common iliac stent on Aug 20, 2020 for short distance lifestyle limiting claudication started on Plavix.  He developed postoperative atrial flutter/flutter and he was placed on Eliquis and Metoprolol.   He was seen by his Karren Cobble NP in the outpatient clinic earlier today for anemia follow-up.  In review of his laboratory studies it appears as if he has had normocytic anemia since at least January 2022. His hemoglobin level was 9.4 on 08/30/2020 which dropped down to 7.3 gms today.  He reported feeling weak with associated chest tightness and shortness of breath so he was sent directly to [facility] ED for further evaluation. Labs in the ED showed a Hemoglobin level 8.0.  Hematocrit 28.7.  MCV 86.7.  Platelet 551.  BUN 18.  Creatinine 1.19.  Normal LFTs.  INR 1.1.  FOBT positive  05/05/2020 Hg 14.3 05/06/2020 Hg 12.8 05/24/2020 Hg 13.1 05/31/2020 Hg 11.4 06/02/2020 Hg 10 06/12/2020 Hg 10.4 08/03/2020 Hg 10.8 08/14/2020 Hg 11.6 08/30/2020 Hg 9.4 09/05/2020 Hg 7.3  He complains of having nausea for 1 to 2 years.  No dysphagia or heartburn.  No upper or lower abdominal pain.  He typically passes a solid brown stool most days.  He intermittently takes Linzess which  results in diarrhea.  Yesterday he noted his stool was a darker brown.  He denies seeing any black stool.  No rectal bleeding.  His last dose of Eliquis was at 10 AM on 09/04/2020.  He stopped taking Plavix 3 or 4 days ago because he felt "cold" while he took it.  Aspirin was discontinued on 08/22/2020.  He takes Meloxicam 7.5 mg 1 tab 2 to 3 days monthly for back pain.  He denies any past history of a GI bleed.  He reported undergoing a colonoscopy approximately 10 years ago at [facility] which he reported was normal.  No known family history of esophageal, gastric or colon cancer.  Reported having lower sternal chest tightness earlier this morning which lasted for 2 to 3 hours then abated.  He also had mild shortness of breath which resolved while in the ED.  He is now on oxygen 2 L nasal cannula.  Intermittent cough.  No hemoptysis.  He denies alcohol or drug use.  He smokes 1/2 pack of cigarettes every other day, previously smoked 2 packs/day.  His significant other and POA [name] is at the bedside.   TTE 08/21/2020 1. Left ventricular ejection fraction, by estimation, is 60 to 65%. The  left ventricle has normal function. The left ventricle has no regional  wall motion abnormalities. Left ventricular diastolic parameters are  indeterminate.  2. Right ventricular systolic function is mildly reduced. The right  ventricular size is normal.  3. The mitral valve is abnormal. No evidence of mitral valve  regurgitation. The mean mitral valve gradient  is 4.0 mmHg with average  heart rate of 87 bpm. Moderate mitral annular calcification.  4. The aortic valve is calcified. Aortic valve regurgitation is not  visualized. Mild to moderate aortic valve sclerosis/calcification is  present. Aortic valve area, by VTI measures 0.91 cm. Aortic valve mean  gradient measures 8.0 mmHg. Aortic valve  Vmax measures 1.88 m/s. Aortic valve acceleration time measures 97 msec.  There is  discordance between the AVA and gradients; DVI is 0.36 with LV  stroke volume index of 30; Aortic stenosis may be underestimated in this  study. Consider outpatient  monitoring if clinically indicated    Past Medical History:  Diagnosis Date  . Adenocarcinoma of lung, right ([place]) 04/18/2016  . Anxiety   . Arthritis   . Asthma   . BPH (benign prostatic hyperplasia)    with urinary retention 02/06/20  . CHF (congestive heart failure) ([place])   . COPD (chronic obstructive pulmonary disease) ([place])   . Depression   . Diabetes mellitus without complication (HCC)    no meds  . Diabetes mellitus, type II ([place])   . DM (diabetes mellitus) ([place]) 07/09/2016  . Dyspnea   . History of kidney stones   . History of radiation therapy 06/25/16-08/20/16   right lung  . Hyperlipidemia   . Hypertension   . Hypertension   . Hypothyroidism   . Macular degeneration   . Neuropathy   . Non-small cell lung cancer, right ([place]) 04/18/2016  . Peripheral vascular disease ([place])   . Prostatitis   . Pulmonary nodule, left 07/16/2016  . Sleep apnea    cpap    Past Surgical History:  Procedure Laterality Date  . ABDOMINAL AORTOGRAM W/LOWER EXTREMITY Left 02/06/2020   Procedure: ABDOMINAL AORTOGRAM W/LOWER EXTREMITY;  Surgeon: Lorretta Harp, MD;  Location: [place] [facility];  Service: Cardiovascular;  Laterality: Left;  . CATARACT EXTRACTION, BILATERAL Bilateral   . ENDARTERECTOMY FEMORAL Right 08/20/2020   Procedure: ENDARTERECTOMY  RIGHT FEMORAL ARTERY;  Surgeon: Marty Heck, MD;  Location: [name];  Service: Vascular;  Laterality: Right;  . INSERTION OF ILIAC STENT Right 08/20/2020   Procedure: RETROGRADE INSERTION OF RIGHT ILIAC STENT;  Surgeon: Marty Heck, MD;  Location: [place];  Service: Vascular;  Laterality: Right;  . INTRAOPERATIVE ARTERIOGRAM Right 08/20/2020   Procedure: INTRA OPERATIVE ARTERIOGRAM ILIAC;  Surgeon: Marty Heck, MD;  Location: [place];  Service: Vascular;   Laterality: Right;  . PATCH ANGIOPLASTY Right 08/20/2020   Procedure: PATCH ANGIOPLASTY RIGHT FEMORAL ARTERY;  Surgeon: Marty Heck, MD;  Location: [place];  Service: Vascular;  Laterality: Right;  . PORTACATH PLACEMENT Left 06/13/2016   Procedure: INSERTION PORT-A-CATH;  Surgeon: Aviva Signs, MD;  Location: [facility];  Service: General;  Laterality: Left;  . TRANSURETHRAL RESECTION OF PROSTATE N/A 05/31/2020   Procedure: TRANSURETHRAL RESECTION OF THE PROSTATE (TURP);  Surgeon: Cleon Gustin, MD;  Location: [facility];  Service: Urology;  Laterality: N/A;  . VIDEO BRONCHOSCOPY WITH ENDOBRONCHIAL NAVIGATION N/A 05/28/2016   Procedure: VIDEO BRONCHOSCOPY WITH ENDOBRONCHIAL NAVIGATION;  Surgeon: [name], MD;  Location: [place];  Service: Thoracic;  Laterality: N/A;  . VIDEO BRONCHOSCOPY WITH ENDOBRONCHIAL ULTRASOUND N/A 05/28/2016   Procedure: VIDEO BRONCHOSCOPY WITH ENDOBRONCHIAL ULTRASOUND;  Surgeon: [name], MD;  Location: [place];  Service: Thoracic;  Laterality: N/A;    Prior to Admission medications   Medication Sig Start Date End Date Taking? Authorizing Provider  albuterol (VENTOLIN HFA) 108 (90 Base) MCG/ACT  inhaler Inhale 2 puffs into the lungs every 4 (four) hours as needed for wheezing or shortness of breath. 08/08/20  Yes Hawks, Christy A, FNP  apixaban (ELIQUIS) 5 MG TABS tablet Take 1 tablet (5 mg total) by mouth 2 (two) times daily. 08/22/20  Yes Rhyne, Samantha J, PA-C  Blood Glucose Monitoring Suppl (ONETOUCH VERIO REFLECT) w/Device KIT Use to test blood sugars daily as directed. DX: E11.9 10/14/19  Yes Hawks, Christy A, FNP  cloNIDine (CATAPRES) 0.1 MG tablet TAKE 1 TABLET (0.1 MG TOTAL) BY MOUTH 3 (THREE) TIMES DAILY. 06/14/20  Yes Hawks, Christy A, FNP  clopidogrel (PLAVIX) 75 MG tablet Take 1 tablet (75 mg total) by mouth daily at 6 (six) AM. 08/22/20  Yes Rhyne, Samantha J, PA-C  dutasteride (AVODART) 0.5 MG capsule TAKE 1 CAPSULE BY MOUTH EVERY DAY Patient  taking differently: Take 0.5 mg by mouth daily. 03/26/20  Yes Hawks, Christy A, FNP  ezetimibe (ZETIA) 10 MG tablet Take 1 tablet (10 mg total) by mouth daily. 08/22/20  Yes Rhyne, Samantha J, PA-C  fluticasone (FLONASE) 50 MCG/ACT nasal spray Place 1 spray into both nostrils daily as needed for allergies or rhinitis. Patient taking differently: Place 1 spray into both nostrils daily. 08/03/20  Yes Hawks, Christy A, FNP  furosemide (LASIX) 20 MG tablet Take 2 tablets (40 mg total) by mouth 2 (two) times daily. Take 40 mg in AM and 20 mg in afternoon 08/30/20  Yes Hawks, Reserve A, FNP  gabapentin (NEURONTIN) 300 MG capsule TAKE 1 CAPSULE BY MOUTH THREE TIMES A DAY Patient taking differently: Take 300 mg by mouth 3 (three) times daily. 08/13/20  Yes Hawks, Christy A, FNP  HYDROcodone-acetaminophen (NORCO) 5-325 MG tablet Take 1 tablet by mouth every 6 (six) hours as needed for moderate pain. 08/21/20  Yes Rhyne, Hulen Shouts, PA-C  linaclotide (LINZESS) 145 MCG CAPS capsule Take 1 capsule (145 mcg total) by mouth daily. To regulate bowel movements 08/14/20  Yes Stacks, Cletus Gash, MD  losartan (COZAAR) 50 MG tablet Take 0.5 tablets (25 mg total) by mouth daily. 08/30/20  Yes Hawks, Christy A, FNP  meloxicam (MOBIC) 7.5 MG tablet Take 1 tablet (7.5 mg total) by mouth daily as needed for pain (back pain). 03/20/20  Yes Hawks, Christy A, FNP  metFORMIN (GLUCOPHAGE-XR) 500 MG 24 hr tablet Take 1 tablet (500 mg total) by mouth daily with breakfast. 03/20/20  Yes Hawks, Christy A, FNP  Meth-Hyo-M Barnett Hatter Phos-Ph Sal (URO-MP) 118 MG CAPS TAKE 1 CAPSULE (118 MG TOTAL) BY MOUTH 2 (TWO) TIMES DAILY AS NEEDED. Patient taking differently: Take 118 mg by mouth daily as needed (Uninary burning). 07/31/20  Yes McKenzie, Candee Furbish, MD  ondansetron (ZOFRAN-ODT) 4 MG disintegrating tablet TAKE 1 TABLET BY MOUTH EVERY 8 HOURS AS NEEDED FOR NAUSEA AND VOMITING Patient taking differently: Take 4 mg by mouth every 8 (eight) hours as needed  for vomiting or nausea. 07/24/20  Yes Hawks, Christy A, FNP  OneTouch Delica Lancets 25D MISC Use to test blood sugars daily as directed. DX: E11.9 10/14/19  Yes Hawks, Christy A, FNP  pravastatin (PRAVACHOL) 40 MG tablet TAKE 1 TABLET (40 MG TOTAL) BY MOUTH ON MONDAYS , WEDNESDAYS, AND FRIDAYS AS DIRECTED Patient taking differently: Take 40 mg by mouth See admin instructions. Take one tablet by  mouth on Monday, Wednesday and Fridays 05/11/20  Yes Hawks, Christy A, FNP  Semaglutide (RYBELSUS) 7 MG TABS Take 7 mg by mouth daily. 03/20/20  Yes [name], FNP  temazepam (RESTORIL) 7.5 MG capsule Take 1 capsule (7.5 mg total) by mouth at bedtime as needed for sleep. 07/27/20  Yes Hawks, Christy A, FNP  glucose blood (ONETOUCH VERIO) test strip Use to test blood sugars daily as directed. DX: E11.9 10/14/19   Evelina Dun A, FNP  levothyroxine (SYNTHROID) 50 MCG tablet Take 1 tablet (50 mcg total) by mouth daily. 06/05/20 07/05/20  Manuella Ghazi, Pratik D, DO    No current facility-administered medications for this encounter.   Current Outpatient Medications  Medication Sig Dispense Refill  . albuterol (VENTOLIN HFA) 108 (90 Base) MCG/ACT inhaler Inhale 2 puffs into the lungs every 4 (four) hours as needed for wheezing or shortness of breath. 8 g 6  . apixaban (ELIQUIS) 5 MG TABS tablet Take 1 tablet (5 mg total) by mouth 2 (two) times daily. 60 tablet 3  . Blood Glucose Monitoring Suppl (ONETOUCH VERIO REFLECT) w/Device KIT Use to test blood sugars daily as directed. DX: E11.9 1 kit 1  . cloNIDine (CATAPRES) 0.1 MG tablet TAKE 1 TABLET (0.1 MG TOTAL) BY MOUTH 3 (THREE) TIMES DAILY. 270 tablet 0  . clopidogrel (PLAVIX) 75 MG tablet Take 1 tablet (75 mg total) by mouth daily at 6 (six) AM. 30 tablet 11  . dutasteride (AVODART) 0.5 MG capsule TAKE 1 CAPSULE BY MOUTH EVERY DAY (Patient taking differently: Take 0.5 mg by mouth daily.) 90 capsule 1  . ezetimibe (ZETIA) 10 MG tablet Take 1 tablet (10 mg total) by  mouth daily. 30 tablet 1  . fluticasone (FLONASE) 50 MCG/ACT nasal spray Place 1 spray into both nostrils daily as needed for allergies or rhinitis. (Patient taking differently: Place 1 spray into both nostrils daily.) 18.2 mL 2  . furosemide (LASIX) 20 MG tablet Take 2 tablets (40 mg total) by mouth 2 (two) times daily. Take 40 mg in AM and 20 mg in afternoon 180 tablet 2  . gabapentin (NEURONTIN) 300 MG capsule TAKE 1 CAPSULE BY MOUTH THREE TIMES A DAY (Patient taking differently: Take 300 mg by mouth 3 (three) times daily.) 270 capsule 0  . HYDROcodone-acetaminophen (NORCO) 5-325 MG tablet Take 1 tablet by mouth every 6 (six) hours as needed for moderate pain. 20 tablet 0  . linaclotide (LINZESS) 145 MCG CAPS capsule Take 1 capsule (145 mcg total) by mouth daily. To regulate bowel movements 30 capsule 5  . losartan (COZAAR) 50 MG tablet Take 0.5 tablets (25 mg total) by mouth daily. 45 tablet 3  . meloxicam (MOBIC) 7.5 MG tablet Take 1 tablet (7.5 mg total) by mouth daily as needed for pain (back pain). 90 tablet 0  . metFORMIN (GLUCOPHAGE-XR) 500 MG 24 hr tablet Take 1 tablet (500 mg total) by mouth daily with breakfast. 90 tablet 1  . Meth-Hyo-M Bl-Na Phos-Ph Sal (URO-MP) 118 MG CAPS TAKE 1 CAPSULE (118 MG TOTAL) BY MOUTH 2 (TWO) TIMES DAILY AS NEEDED. (Patient taking differently: Take 118 mg by mouth daily as needed (Uninary burning).) 30 capsule 3  . ondansetron (ZOFRAN-ODT) 4 MG disintegrating tablet TAKE 1 TABLET BY MOUTH EVERY 8 HOURS AS NEEDED FOR NAUSEA AND VOMITING (Patient taking differently: Take 4 mg by mouth every 8 (eight) hours as needed for vomiting or nausea.) 20 tablet 0  . OneTouch Delica Lancets 16X MISC Use to test blood sugars daily as directed. DX: E11.9 100 each 1  . pravastatin (PRAVACHOL) 40 MG tablet TAKE 1 TABLET (40 MG TOTAL) BY MOUTH ON MONDAYS , WEDNESDAYS, AND FRIDAYS AS DIRECTED (Patient  taking differently: Take 40 mg by mouth See admin instructions. Take one tablet  by  mouth on Monday, Wednesday and Fridays) 48 tablet 3  . Semaglutide (RYBELSUS) 7 MG TABS Take 7 mg by mouth daily. 90 tablet 1  . temazepam (RESTORIL) 7.5 MG capsule Take 1 capsule (7.5 mg total) by mouth at bedtime as needed for sleep. 30 capsule 1  . glucose blood (ONETOUCH VERIO) test strip Use to test blood sugars daily as directed. DX: E11.9 100 each 12  . levothyroxine (SYNTHROID) 50 MCG tablet Take 1 tablet (50 mcg total) by mouth daily. 30 tablet 2    Allergies as of 09/05/2020 - Review Complete 09/05/2020  Allergen Reaction Noted  . Jardiance [empagliflozin] Other (See Comments) 09/23/2019  . Lopressor [metoprolol] Other (See Comments) 09/21/2019  . Atorvastatin  08/21/2020  . Rosuvastatin  08/21/2020  . Sulfa antibiotics Swelling 06/18/2020    Family History  Problem Relation Age of Onset  . Hypertension Mother   . Diabetes Father   . Heart disease Father   . Stroke Father   . Hypertension Sister     Social History   Socioeconomic History  . Marital status: Divorced    Spouse name: Not on file  . Number of children: 5  . Years of education: 10  . Highest education level: 10th grade  Occupational History  . Occupation: Retired  Tobacco Use  . Smoking status: Current Every Day Smoker    Packs/day: 0.50    Years: 55.00    Pack years: 27.50    Types: Cigarettes    Start date: 03/11/1961  . Smokeless tobacco: Never Used  . Tobacco comment: 1/2 pack to 1 pack per day 12/14/19  Vaping Use  . Vaping Use: Never used  Substance and Sexual Activity  . Alcohol use: No  . Drug use: No  . Sexual activity: Yes    Birth control/protection: None  Other Topics Concern  . Not on file  Social History Narrative  . Not on file   Social Determinants of Health   Financial Resource Strain: Not on file  Food Insecurity: Not on file  Transportation Needs: Not on file  Physical Activity: Not on file  Stress: Not on file  Social Connections: Not on file  Intimate Partner  Violence: Not on file    Review of Systems: Gen: Denies fever, sweats or chills. No weight loss.  CV: Denies chest pain, palpitations or edema. Resp: Denies cough, shortness of breath of hemoptysis.  GI: See HPI. GU : + Recent UTI. Denies urinary burning, blood in urine, increased urinary frequency or incontinence. MS: + Generalized weakness.  Derm: Denies rash, itchiness, skin lesions or unhealing ulcers. Psych: Denies depression, anxiety or memory loss. Heme: Denies easy bruising, bleeding. Neuro:  Denies headaches, dizziness or paresthesias. Endo:  + DM II.   Physical Exam: Vital signs in last 24 hours: Temp:  [98.2 F (36.8 C)-98.7 F (37.1 C)] 98.2 F (36.8 C) (06/01 1045) Pulse Rate:  [86-114] 110 (06/01 1215) Resp:  [16-22] 22 (06/01 1215) BP: (123-141)/(63-65) 123/63 (06/01 1215) SpO2:  [95 %-100 %] 100 % (06/01 1215) Weight:  [75.9 kg] 75.9 kg (06/01 0817)   General:  Alert fatigued appearing 75 year old male in no acute distress. His is HOH. Head:  Normocephalic and atraumatic. Eyes:  No scleral icterus. Conjunctiva pink. Ears:  Normal auditory acuity. Nose:  No deformity, discharge or lesions. Mouth: Upper and lower dentures intact.  No ulcers or lesions.  Neck:  Supple. No lymphadenopathy or thyromegaly.  Lungs: Breath sounds with scattered expiratory wheezes throughout. Heart: Regular rate and rhythm at this time, no murmur. Abdomen: Soft, mildly distended but soft.  Positive bowel sounds all 4 quadrants.  No masses.  No HSM. Rectal: Non-inflamed external hemorrhoids. Scant amount of dark brown stool in the rectum. + FOBT.  Musculoskeletal:  Symmetrical without gross deformities.  Pulses:  Normal pulses noted. Extremities:  Bilateral LEs withw 1+ edema.  Neurologic:  Alert and  oriented x 4. No focal deficits.  Skin:  Intact without significant lesions or rashes. Psych:  Alert and cooperative. Normal mood and affect.  Intake/Output from previous day: No  intake/output data recorded. Intake/Output this shift: No intake/output data recorded.  Lab Results: Recent Labs    09/05/20 1103  WBC 10.4  HGB 8.0*  HCT 28.7*  PLT 551*   BMET Recent Labs    09/05/20 1103  NA 134*  K 4.0  CL 96*  CO2 28  GLUCOSE 165*  BUN 18  CREATININE 1.19  CALCIUM 8.7*   LFT Recent Labs    09/05/20 1103  PROT 6.4*  ALBUMIN 3.1*  AST 17  ALT 12  ALKPHOS 99  BILITOT 0.6   PT/INR Recent Labs    09/05/20 1103  LABPROT 14.6  INR 1.1   Hepatitis Panel No results for input(s): HEPBSAG, HCVAB, HEPAIGM, HEPBIGM in the last 72 hours.    Studies/Results: No results found.  IMPRESSION/PLAN:  75. 75 year old male with normocytic anemia and + FOBT.  Hg 9.4 on 5/26 -> 7.3 on 09/05/2020. Dark brown colored stools. No rectal bleeding.  -Clear liquid diet -EGD and colonoscopy, timing to be verified by Dr. Henrene Pastor. Note patient stated he stopped taking Plavix 3 or 4 days ago because he felt cold after taking it. (Per cardiologist Dr. Audie Box patient should still be on Plavix s/p right iliac stent placement).  -EGD and colonoscopy benefits and risks discussed including risk with sedation, risk of bleeding, perforation and infection. Check H/H Q 6hrs x 24 hours -Recommend transfusing for Hg < 8 -PPI IV BID -CBC, iron panel and B12 in am -Await further recommendations per Dr. Henrene Pastor  2. S/P right common femoral endarterectomy with profundoplasty and bovine patch angioplasty with a right common iliac stent 08/20/2020 started on Plavix. Patient reported he stopped taking Plavix 3 or 4 days ago.  -Recommend vascular consult   3. Afib/flutter on Eliquis. Last dose of Eliquis was at 10 am on 09/04/2020  4. Non-small cell lung cancer s/p radiation and chemo 2018 with enlarging bilateral pulmonary nodules scheduled for SBRT  09/2020  5. COPD  6. Sleep apnea  7. Non-obstructing CAD per CT. CHF with LV EF 60 -65%   Noralyn Pick  09/05/2020, 12:54  PM   GI ATTENDING  History, laboratories, x-rays reviewed.  Patient seen and examined.  Agree with comprehensive consultation note as outlined above.  Complex patient with multiple active medical problems as outlined above.  Also multiple significant problems as part of his medical history.  We are asked to see for interval anemia.  Suspect that the significant change in his hemoglobin recently was related to vascular intervention.  He has been on antiplatelet and oral anticoagulation therapies.  His stool is Hemoccult positive with a background history of reporting dark stools.  No overt bleeding.  He is at risk for upper GI mucosal lesions.  He did have colonoscopy 10 years ago elsewhere without significant abnormalities, per his report.  His vascular interventions were recent and he needs his antiplatelet therapy as well as oral anticoagulation therapy for his atrial arrhythmia.  My recommendations are as follows:  1.  Transfuse to clinically appropriate hemoglobin 2.  Twice daily PPI therapy 3.  Schedule upper endoscopy tomorrow.  Scheduled for 10:45 AM.  The patient is HIGH RISK GIVEN HIS COMORBIDITIES AND ANTIPLATELET/ANTICOAGULATION THERAPIES.The nature of the procedure, as well as the risks, benefits, and alternatives were carefully and thoroughly reviewed with the patient. Ample time for discussion and questions allowed. The patient understood, was satisfied, and agreed to proceed. 4.  CT scan of the abdomen and pelvis to rule out retroperitoneal hemorrhage.  As well rule out any significant intracolonic pathology (not a good colonoscopy candidate at this particular time) that might necessitate early colonoscopy.  If negative, colonoscopy a few months down the road could be considered after current acute problems have settled.  5.  FOLLOW-UP ANEMIA PANEL  Docia Chuck. Geri Seminole., M.D. [facility] Division of Gastroenterology

## 2020-09-05 NOTE — Progress Notes (Signed)
Subjective:    Patient ID: John [name], male    DOB: 1945/10/10, 75 y.o.   MRN: [id number]  Chief Complaint  Patient presents with  . Hypoglycemia   Pt presents to the office today to recheck hgb. He was seen on 08/30/20 and his hgb was 9.4. Today it is 7.3. He reports he is weak, chest pain, and SOB. Denies any blood in urine, stool.   He also had a UTI. His urine culture returned.  Urinary Tract Infection  This is a recurrent problem. The current episode started 1 to 4 weeks ago. The problem occurs intermittently. The patient is experiencing no pain. Associated symptoms include frequency and nausea. Pertinent negatives include no hematuria. He has tried increased fluids for the symptoms. The treatment provided mild relief.      Review of Systems  Gastrointestinal: Positive for nausea.  Genitourinary: Positive for frequency. Negative for hematuria.  All other systems reviewed and are negative.      Objective:   Physical Exam Vitals reviewed.  Constitutional:      General: He is not in acute distress.    Appearance: He is well-developed.  HENT:     Head: Normocephalic.     Right Ear: Tympanic membrane normal.     Left Ear: Tympanic membrane normal.  Eyes:     General:        Right eye: No discharge.        Left eye: No discharge.     Pupils: Pupils are equal, round, and reactive to light.  Neck:     Thyroid: No thyromegaly.  Cardiovascular:     Rate and Rhythm: Normal rate and regular rhythm.     Heart sounds: Murmur heard.    Pulmonary:     Effort: Pulmonary effort is normal. No respiratory distress.     Breath sounds: Normal breath sounds. No wheezing.  Abdominal:     General: Bowel sounds are normal. There is no distension.     Palpations: Abdomen is soft.     Tenderness: There is no abdominal tenderness.  Musculoskeletal:        General: No tenderness. Normal range of motion.     Cervical back: Normal range of motion and neck supple.     Right lower  leg: Edema (2+) present.     Left lower leg: Edema (2+) present.  Skin:    General: Skin is warm and dry.     Coloration: Skin is pale.     Findings: No erythema or rash.  Neurological:     Mental Status: He is alert and oriented to person, place, and time.     Cranial Nerves: No cranial nerve deficit.     Deep Tendon Reflexes: Reflexes are normal and symmetric.  Psychiatric:        Behavior: Behavior normal.        Thought Content: Thought content normal.        Judgment: Judgment normal.       BP 126/63   Pulse (!) 114   Temp 98.7 F (37.1 C)   Ht 5\' 6"  (1.676 m)   Wt 167 lb 6.4 oz (75.9 kg)   SpO2 95%   BMI 27.02 kg/m      Assessment & Plan:  RANEN DOOLIN comes in today with chief complaint of Hypoglycemia   Diagnosis and orders addressed:  1. Type 2 diabetes mellitus with diabetic polyneuropathy, without long-term current use of insulin ([place])  2. Low hemoglobi -  Hemoglobin, fingerstick  3. SOB (shortness of breath) - Hemoglobin, fingerstick  4. Chest pain, unspecified type - Hemoglobin, fingerstick  5. Symptomatic anemia Given SOB and chest pain will send to ED. Will need transfusion. Will need follow up with GI   6. Acute cystitis without hematuria Stop Keflex and start Cipro Force fluids Keep follow up with Urologists  - ciprofloxacin (CIPRO) 500 MG tablet; Take 1 tablet (500 mg total) by mouth 2 (two) times daily.  Dispense: 14 tablet; Refill: 0   Follow up plan: 1-2 weeks    Evelina Dun, FNP

## 2020-09-05 NOTE — ED Provider Notes (Signed)
[facility] EMERGENCY DEPARTMENT Provider Note   CSN: [id number] Arrival date & time: 09/05/20  1031     History Symptomatic anemia, dark stools  John [name] is a 75 y.o. male with hx of adenocarcinoma of R lung diagnosed in 2018, HFpEF (EF 60-65%), A flutter, COPD, DM, asthma, HTN, HLD, OSA who presents for worsening anemia. He was sent from his PCP for a drop in hemoglobin from 9.4 to 7.3 over 5 days. Patient complains of dark stools, generalized weakness, low energy, drowsiness, and dizziness. No other clear source of blood loss. Also had cramping chest pain this morning which resolved spontaneously. No hx of CAD. He is taking Eliquis, no longer on ASA or Plavix. Last dose was yesterday morning, forgot today. Takes Mobic occasionally for back pain, no other NSAID use. On 5/16, he had vascular surgery with cutdown at the right groin, endarterectomy and patch angioplasty of the right femoral artery, and stent of right iliac stent. Since then has had some drainage at the surgical site, but no bruising or other overt blood loss. Reports a colonoscopy roughly 10 years ago which was negative by [facility]. No prior GI bleeds.  He is also being treated for UTI by his PCP. Was taking Keflex, changed to Cipro. Has follow up with urology.   Past Medical History:  Diagnosis Date  . Adenocarcinoma of lung, right ([place]) 04/18/2016  . Anxiety   . Arthritis   . Asthma   . BPH (benign prostatic hyperplasia)    with urinary retention 02/06/20  . CHF (congestive heart failure) ([place])   . COPD (chronic obstructive pulmonary disease) ([place])   . Depression   . Diabetes mellitus without complication (HCC)    no meds  . Diabetes mellitus, type II ([place])   . DM (diabetes mellitus) ([place]) 07/09/2016  . Dyspnea   . History of kidney stones   . History of radiation therapy 06/25/16-08/20/16   right lung  . Hyperlipidemia   . Hypertension   . Hypertension   .  Hypothyroidism   . Macular degeneration   . Neuropathy   . Non-small cell lung cancer, right ([place]) 04/18/2016  . Peripheral vascular disease ([place])   . Prostatitis   . Pulmonary nodule, left 07/16/2016  . Sleep apnea    cpap    Patient Active Problem List   Diagnosis Date Noted  . Peripheral arterial disease ([place]) 08/20/2020  . Dysuria 07/27/2020  . Sepsis due to Escherichia coli (E. coli) ([place]) 05/07/2020  . Hypokalemia 05/06/2020  . Hyperglycemia due to diabetes mellitus ([place]) 05/06/2020  . Hypoalbuminemia 05/06/2020  . Prolonged QT interval 05/06/2020  . Lactic acidosis 05/06/2020  . Bronchitis due to tobacco use 05/06/2020  . Acute respiratory failure with hypoxia ([place]) 05/06/2020  . Acute on chronic respiratory failure with hypoxia ([place]) 05/06/2020  . COPD with acute exacerbation ([place]) 05/06/2020  . Tobacco abuse 05/06/2020  . Urinary retention 03/09/2020  . Acute cystitis with hematuria   . Urinary tract infection with hematuria   . Dyspnea   . Hypoxia   . Sepsis secondary to UTI ([place]) 03/03/2020  . COPD GOLD III/ group D still smoking  10/24/2019  . PAD (peripheral artery disease) ([place]) 09/13/2019  . Obstructive sleep apnea treated with continuous positive airway pressure (CPAP) 05/31/2018  . Diabetic neuropathy ([place]) 12/29/2017  . Cigarette smoker 12/29/2017  . Hyperlipidemia associated with type 2 diabetes mellitus ([place]) 12/29/2017  . Primary insomnia 12/29/2017  .  Primary osteoarthritis involving multiple joints 12/29/2017  . OSA (obstructive sleep apnea) 12/10/2017  . Hypothyroidism 06/09/2017  . Joint pain 08/05/2016  . Bilateral hand swelling 08/05/2016  . Hyponatremia 08/05/2016  . Pulmonary nodule, left 07/16/2016  . CHF (congestive heart failure) ([place]) 07/09/2016  . DM (diabetes mellitus) ([place]) 07/09/2016  . Non-small cell lung cancer, right ([place]) 04/18/2016  . Essential hypertension 02/01/2016  . COPD (chronic obstructive pulmonary disease) ([place])  02/01/2016  . BPH with urinary obstruction 02/01/2016  . Chest pain, unspecified 10/26/2008    Past Surgical History:  Procedure Laterality Date  . ABDOMINAL AORTOGRAM W/LOWER EXTREMITY Left 02/06/2020   Procedure: ABDOMINAL AORTOGRAM W/LOWER EXTREMITY;  Surgeon: Lorretta Harp, MD;  Location: [facility];  Service: Cardiovascular;  Laterality: Left;  . CATARACT EXTRACTION, BILATERAL Bilateral   . ENDARTERECTOMY FEMORAL Right 08/20/2020   Procedure: ENDARTERECTOMY  RIGHT FEMORAL ARTERY;  Surgeon: Marty Heck, MD;  Location: [facility];  Service: Vascular;  Laterality: Right;  . INSERTION OF ILIAC STENT Right 08/20/2020   Procedure: RETROGRADE INSERTION OF RIGHT ILIAC STENT;  Surgeon: Marty Heck, MD;  Location: [facility];  Service: Vascular;  Laterality: Right;  . INTRAOPERATIVE ARTERIOGRAM Right 08/20/2020   Procedure: INTRA OPERATIVE ARTERIOGRAM ILIAC;  Surgeon: Marty Heck, MD;  Location: [facility];  Service: Vascular;  Laterality: Right;  . PATCH ANGIOPLASTY Right 08/20/2020   Procedure: PATCH ANGIOPLASTY RIGHT FEMORAL ARTERY;  Surgeon: Marty Heck, MD;  Location: [facility];  Service: Vascular;  Laterality: Right;  . PORTACATH PLACEMENT Left 06/13/2016   Procedure: INSERTION PORT-A-CATH;  Surgeon: Aviva Signs, MD;  Location: [facility];  Service: General;  Laterality: Left;  . TRANSURETHRAL RESECTION OF PROSTATE N/A 05/31/2020   Procedure: TRANSURETHRAL RESECTION OF THE PROSTATE (TURP);  Surgeon: Cleon Gustin, MD;  Location: [facility];  Service: Urology;  Laterality: N/A;  . VIDEO BRONCHOSCOPY WITH ENDOBRONCHIAL NAVIGATION N/A 05/28/2016   Procedure: VIDEO BRONCHOSCOPY WITH ENDOBRONCHIAL NAVIGATION;  Surgeon: [name], MD;  Location: [facility];  Service: Thoracic;  Laterality: N/A;  . VIDEO BRONCHOSCOPY WITH ENDOBRONCHIAL ULTRASOUND N/A 05/28/2016   Procedure: VIDEO BRONCHOSCOPY WITH ENDOBRONCHIAL ULTRASOUND;  Surgeon: [name], MD;  Location: [facility];  Service: Thoracic;  Laterality: N/A;       Family History  Problem Relation Age of Onset  . Hypertension Mother   . Diabetes Father   . Heart disease Father   . Stroke Father   . Hypertension Sister     Social History   Tobacco Use  . Smoking status: Current Every Day Smoker    Packs/day: 0.50    Years: 55.00    Pack years: 27.50    Types: Cigarettes    Start date: 03/11/1961  . Smokeless tobacco: Never Used  . Tobacco comment: 1/2 pack to 1 pack per day 12/14/19  Vaping Use  . Vaping Use: Never used  Substance Use Topics  . Alcohol use: No  . Drug use: No    Home Medications Prior to Admission medications   Medication Sig Start Date End Date Taking? Authorizing Provider  albuterol (VENTOLIN HFA) 108 (90 Base) MCG/ACT inhaler Inhale 2 puffs into the lungs every 4 (four) hours as needed for wheezing or shortness of breath. 08/08/20  Yes Hawks, Christy A, FNP  apixaban (ELIQUIS) 5 MG TABS tablet Take 1 tablet (5 mg total) by mouth 2 (two) times daily. 08/22/20  Yes Rhyne, Hulen Shouts, PA-C  Blood Glucose Monitoring Suppl (ONETOUCH VERIO  REFLECT) w/Device KIT Use to test blood sugars daily as directed. DX: E11.9 10/14/19  Yes Hawks, Christy A, FNP  cloNIDine (CATAPRES) 0.1 MG tablet TAKE 1 TABLET (0.1 MG TOTAL) BY MOUTH 3 (THREE) TIMES DAILY. 06/14/20  Yes Hawks, Christy A, FNP  clopidogrel (PLAVIX) 75 MG tablet Take 1 tablet (75 mg total) by mouth daily at 6 (six) AM. 08/22/20  Yes Rhyne, Samantha J, PA-C  dutasteride (AVODART) 0.5 MG capsule TAKE 1 CAPSULE BY MOUTH EVERY DAY Patient taking differently: Take 0.5 mg by mouth daily. 03/26/20  Yes Hawks, Christy A, FNP  ezetimibe (ZETIA) 10 MG tablet Take 1 tablet (10 mg total) by mouth daily. 08/22/20  Yes Rhyne, Samantha J, PA-C  fluticasone (FLONASE) 50 MCG/ACT nasal spray Place 1 spray into both nostrils daily as needed for allergies or rhinitis. Patient taking differently: Place 1 spray into both nostrils daily. 08/03/20  Yes  Hawks, Christy A, FNP  furosemide (LASIX) 20 MG tablet Take 2 tablets (40 mg total) by mouth 2 (two) times daily. Take 40 mg in AM and 20 mg in afternoon 08/30/20  Yes Hawks, [place] A, FNP  gabapentin (NEURONTIN) 300 MG capsule TAKE 1 CAPSULE BY MOUTH THREE TIMES A DAY Patient taking differently: Take 300 mg by mouth 3 (three) times daily. 08/13/20  Yes Hawks, Christy A, FNP  HYDROcodone-acetaminophen (NORCO) 5-325 MG tablet Take 1 tablet by mouth every 6 (six) hours as needed for moderate pain. 08/21/20  Yes Rhyne, Hulen Shouts, PA-C  linaclotide (LINZESS) 145 MCG CAPS capsule Take 1 capsule (145 mcg total) by mouth daily. To regulate bowel movements 08/14/20  Yes Stacks, Cletus Gash, MD  losartan (COZAAR) 50 MG tablet Take 0.5 tablets (25 mg total) by mouth daily. 08/30/20  Yes Hawks, Christy A, FNP  meloxicam (MOBIC) 7.5 MG tablet Take 1 tablet (7.5 mg total) by mouth daily as needed for pain (back pain). 03/20/20  Yes Hawks, Christy A, FNP  metFORMIN (GLUCOPHAGE-XR) 500 MG 24 hr tablet Take 1 tablet (500 mg total) by mouth daily with breakfast. 03/20/20  Yes Hawks, Christy A, FNP  Meth-Hyo-M Barnett Hatter Phos-Ph Sal (URO-MP) 118 MG CAPS TAKE 1 CAPSULE (118 MG TOTAL) BY MOUTH 2 (TWO) TIMES DAILY AS NEEDED. Patient taking differently: Take 118 mg by mouth daily as needed (Uninary burning). 07/31/20  Yes McKenzie, Candee Furbish, MD  ondansetron (ZOFRAN-ODT) 4 MG disintegrating tablet TAKE 1 TABLET BY MOUTH EVERY 8 HOURS AS NEEDED FOR NAUSEA AND VOMITING Patient taking differently: Take 4 mg by mouth every 8 (eight) hours as needed for vomiting or nausea. 07/24/20  Yes Hawks, Christy A, FNP  OneTouch Delica Lancets 43P MISC Use to test blood sugars daily as directed. DX: E11.9 10/14/19  Yes Hawks, Christy A, FNP  pravastatin (PRAVACHOL) 40 MG tablet TAKE 1 TABLET (40 MG TOTAL) BY MOUTH ON MONDAYS , WEDNESDAYS, AND FRIDAYS AS DIRECTED Patient taking differently: Take 40 mg by mouth See admin instructions. Take one tablet by   mouth on Monday, Wednesday and Fridays 05/11/20  Yes Hawks, Christy A, FNP  Semaglutide (RYBELSUS) 7 MG TABS Take 7 mg by mouth daily. 03/20/20  Yes Hawks, Christy A, FNP  temazepam (RESTORIL) 7.5 MG capsule Take 1 capsule (7.5 mg total) by mouth at bedtime as needed for sleep. 07/27/20  Yes Hawks, Christy A, FNP  glucose blood (ONETOUCH VERIO) test strip Use to test blood sugars daily as directed. DX: E11.9 10/14/19   [name], FNP  levothyroxine (SYNTHROID) 50 MCG tablet Take 1  tablet (50 mcg total) by mouth daily. 06/05/20 07/05/20  Heath Lark D, DO    Allergies    Jardiance [empagliflozin], Lopressor [metoprolol], Atorvastatin, Rosuvastatin, and Sulfa antibiotics  Review of Systems   Review of Systems  Constitutional: Positive for activity change and fatigue.  Respiratory: Negative for shortness of breath.   Cardiovascular: Positive for chest pain. Negative for palpitations.  Gastrointestinal: Positive for constipation. Negative for abdominal pain, anal bleeding, blood in stool, nausea, rectal pain and vomiting.       Dark brown stools  Musculoskeletal: Positive for back pain.  Skin: Positive for wound (surgical wound in R groin, well healing).  Neurological: Positive for dizziness. Negative for syncope.  All other systems reviewed and are negative.   Physical Exam Updated Vital Signs BP 126/66   Pulse 85   Temp 98.2 F (36.8 C) (Oral)   Resp 18   SpO2 98%   Physical Exam Vitals and nursing note reviewed.  Constitutional:      General: He is not in acute distress.    Appearance: Normal appearance. He is obese. He is not toxic-appearing.  HENT:     Head: Normocephalic and atraumatic.     Right Ear: External ear normal.     Left Ear: External ear normal.     Nose: Nose normal.     Mouth/Throat:     Mouth: Mucous membranes are moist.  Eyes:     Extraocular Movements: Extraocular movements intact.  Cardiovascular:     Comments: In atrial flutter Pulmonary:      Effort: Pulmonary effort is normal. No respiratory distress.     Breath sounds: No stridor. Wheezing (mild expiratory wheeze in right lower lung field) present. No rhonchi or rales.  Chest:     Chest wall: No tenderness.  Abdominal:     General: Abdomen is flat. There is no distension.  Genitourinary:    Penis: Normal.      Testes: Normal.     Rectum: Guaiac result positive.     Comments: Scrotum is nontender, no gross swelling Rectal exam reveals nonbleeding external hemorrhoid, no fissures or internal hemorrhoids palpated, very little stool in rectal vault, stool is yellow in color Musculoskeletal:        General: No swelling or deformity. Normal range of motion.  Skin:    General: Skin is warm and dry.     Findings: No bruising.     Comments: Well healing wound over right groin from vascular cutdown  Neurological:     General: No focal deficit present.     Mental Status: He is alert and oriented to person, place, and time.  Psychiatric:        Mood and Affect: Mood normal.        Behavior: Behavior normal.     ED Results / Procedures / Treatments   Labs (all labs ordered are listed, but only abnormal results are displayed) Labs Reviewed  CBC WITH DIFFERENTIAL/PLATELET - Abnormal; Notable for the following components:      Result Value   RBC 3.31 (*)    Hemoglobin 8.0 (*)    HCT 28.7 (*)    MCH 24.2 (*)    MCHC 27.9 (*)    RDW 19.2 (*)    Platelets 551 (*)    Neutro Abs 8.2 (*)    Monocytes Absolute 1.3 (*)    All other components within normal limits  COMPREHENSIVE METABOLIC PANEL - Abnormal; Notable for the following components:   Sodium 134 (*)  Chloride 96 (*)    Glucose, Bld 165 (*)    Calcium 8.7 (*)    Total Protein 6.4 (*)    Albumin 3.1 (*)    All other components within normal limits  POC OCCULT BLOOD, ED - Abnormal; Notable for the following components:   Fecal Occult Bld POSITIVE (*)    All other components within normal limits  RESP PANEL BY  RT-PCR (FLU A&B, COVID) ARPGX2  PROTIME-INR  TYPE AND SCREEN    EKG None  Radiology No results found.  Procedures Procedures   Medications Ordered in ED Medications - No data to display  ED Course  I have reviewed the triage vital signs and the nursing notes.  Pertinent labs & imaging results that were available during my care of the patient were reviewed by me and considered in my medical decision making (see chart for details).    MDM Rules/Calculators/A&P                          Patient with gradual decline in hemoglobin from 12.4 on 5/9 to 8.0 in ED today. With dark brown stools and positive hemoccult, likely GI source. No other overt source of bleeding. He is symptomatic from his anemia with fatigue, sleepiness, dizziness. Had vascular procedure on 5/16 but no bruising or bleeding from cutdown site over R groin. He takes Eliquis, last dose was yesterday morning. Rectal exam unremarkable except for positive hemoccult.   Consult placed to GI who evaluated the patient. Recommendations pending.   Spoke with hospitalist service who will admit the patient.  Final Clinical Impression(s) / ED Diagnoses Final diagnoses:  Gastrointestinal hemorrhage, unspecified gastrointestinal hemorrhage type     Andrew Au, MD 09/05/20 1434    Lucrezia Starch, MD 09/06/20 [phone]

## 2020-09-05 NOTE — Patient Instructions (Signed)
Gastrointestinal Bleeding Gastrointestinal (GI) bleeding is bleeding somewhere along the digestive tract, between the mouth and the anus. This tract includes the mouth, esophagus, stomach, small intestine, large intestine, and anus. The large intestine is often called the colon. GI bleeding can be caused by various problems. The severity of these problems can range from mild to serious or even life-threatening. If you have GI bleeding, you may find blood in your stools (feces), you may have black stools, or you may vomit blood. If there is a lot of bleeding, you may need to stay in the hospital. What are the causes? This condition may be caused by:  Inflammation, irritation, or swelling of the esophagus (esophagitis). The esophagus is part of the body that moves food from your mouth to your stomach.  Swollen veins in the rectum (hemorrhoids).  Areas of painful tearing in the anus that are often caused by passing hard stool (anal fissures).  Pouches that form on the colon over time, with age, and may bleed a lot (diverticulosis).  Inflammation (diverticulitis) in areas with diverticulosis. This can cause pain, fever, and bloody stools, although bleeding may be mild.  Growths (polyps) or cancer. Colon cancer often starts out as precancerous polyps.  Gastritis and ulcers. With these, bleeding may come from the upper GI tract, near the stomach. What increases the risk? You are more likely to develop this condition if you:  Have an infection in your stomach from a type of bacteria called Helicobacter pylori.  Take certain medicines, such as: ? NSAIDs. ? Aspirin. ? Selective serotonin reuptake inhibitors (SSRIs). ? Steroids. ? Antiplatelet or anticoagulant medicines.  Smoke.  Drink alcohol. What are the signs or symptoms? Common symptoms of this condition include:  Bright red blood in your vomit, or vomit that looks like coffee grounds.  Bloody, black, or tarry stools. ? Bleeding  from the lower GI tract will usually cause red or maroon blood in the stools. ? Bleeding from the upper GI tract may cause black, tarry stools that are often stronger smelling than usual. ? In certain cases, if the bleeding is fast enough, the stools may be red.  Pain or cramping in the abdomen. How is this diagnosed? This condition may be diagnosed based on:  Your medical history and a physical exam.  Various tests, such as: ? Blood tests. ? Stool tests. ? X-rays and other imaging tests. ? Esophagogastroduodenoscopy (EGD). In this test, a flexible, lighted tube is used to look at your esophagus, stomach, and small intestine. ? Colonoscopy. In this test, a flexible, lighted tube is used to look at your colon. How is this treated? Treatment for this condition depends on the cause of the bleeding. For example:  For bleeding from the esophagus, stomach, small intestine, or colon, the health care provider doing your EGD or colonoscopy may be able to stop the bleeding as part of the procedure.  Inflammation or infection of the colon can be treated with medicines.  Certain rectal problems can be treated with creams, suppositories, or warm baths.  Medicines may be given to reduce acid in your stomach.  Surgery is sometimes needed.  Blood transfusions are sometimes needed if a lot of blood has been lost. If bleeding is mild, you may be allowed to go home. If there is a lot of bleeding, you will need to stay in the hospital for observation. Follow these instructions at home:  Take over-the-counter and prescription medicines only as told by your health care provider.  Eat  foods that are high in fiber, such as beans, whole grains, and fresh fruits and vegetables. This will help to keep your stools soft. Eating 1-3 prunes each day works well for many people.  Drink enough fluid to keep your urine pale yellow.  Keep all follow-up visits as told by your health care provider. This is  important.   Contact a health care provider if:  Your symptoms do not improve. Get help right away if:  Your bleeding does not stop.  You feel light-headed or you faint.  You feel weak.  You have severe cramps in your back or abdomen.  You pass large blood clots in your stool.  Your symptoms are getting worse.  You have chest pain or fast heartbeats. Summary  Gastrointestinal (GI) bleeding is bleeding somewhere along the digestive tract, between the mouth and anus. GI bleeding can be caused by various problems. The severity of these problems can range from mild to serious or even life-threatening.  Treatment for this condition depends on the cause of the bleeding.  Take over-the-counter and prescription medicines only as told by your health care provider.  Keep all follow-up visits as told by your health care provider. This is important.  Get help right away if your bleeding increases, your symptoms are getting worse, or you have new symptoms. This information is not intended to replace advice given to you by your health care provider. Make sure you discuss any questions you have with your health care provider. Document Revised: 11/04/2017 Document Reviewed: 11/04/2017 Elsevier Patient Education  [place].

## 2020-09-06 ENCOUNTER — Encounter (HOSPITAL_COMMUNITY)
Admission: EM | Disposition: A | Discharge status: Home / Self Care | Payer: Self-pay | Source: Home / Self Care | Attending: Emergency Medicine

## 2020-09-06 ENCOUNTER — Telehealth: Payer: Self-pay | Admitting: Family

## 2020-09-06 ENCOUNTER — Encounter (HOSPITAL_COMMUNITY): Payer: Self-pay | Admitting: Internal Medicine

## 2020-09-06 ENCOUNTER — Observation Stay (HOSPITAL_COMMUNITY): Payer: Medicare Other | Admitting: Certified Registered"

## 2020-09-06 ENCOUNTER — Ambulatory Visit: Payer: Medicare Other | Admitting: Radiation Oncology

## 2020-09-06 DIAGNOSIS — M1990 Unspecified osteoarthritis, unspecified site: Secondary | ICD-10-CM | POA: Diagnosis not present

## 2020-09-06 DIAGNOSIS — R338 Other retention of urine: Secondary | ICD-10-CM | POA: Diagnosis not present

## 2020-09-06 DIAGNOSIS — J9611 Chronic respiratory failure with hypoxia: Secondary | ICD-10-CM | POA: Diagnosis not present

## 2020-09-06 DIAGNOSIS — G4733 Obstructive sleep apnea (adult) (pediatric): Secondary | ICD-10-CM | POA: Diagnosis not present

## 2020-09-06 DIAGNOSIS — I480 Paroxysmal atrial fibrillation: Secondary | ICD-10-CM | POA: Diagnosis not present

## 2020-09-06 DIAGNOSIS — F329 Major depressive disorder, single episode, unspecified: Secondary | ICD-10-CM | POA: Diagnosis not present

## 2020-09-06 DIAGNOSIS — J45909 Unspecified asthma, uncomplicated: Secondary | ICD-10-CM | POA: Diagnosis not present

## 2020-09-06 DIAGNOSIS — F419 Anxiety disorder, unspecified: Secondary | ICD-10-CM | POA: Diagnosis not present

## 2020-09-06 DIAGNOSIS — D649 Anemia, unspecified: Secondary | ICD-10-CM

## 2020-09-06 DIAGNOSIS — Z48812 Encounter for surgical aftercare following surgery on the circulatory system: Secondary | ICD-10-CM | POA: Diagnosis not present

## 2020-09-06 DIAGNOSIS — E785 Hyperlipidemia, unspecified: Secondary | ICD-10-CM | POA: Diagnosis not present

## 2020-09-06 DIAGNOSIS — E1151 Type 2 diabetes mellitus with diabetic peripheral angiopathy without gangrene: Secondary | ICD-10-CM | POA: Diagnosis not present

## 2020-09-06 DIAGNOSIS — K253 Acute gastric ulcer without hemorrhage or perforation: Secondary | ICD-10-CM

## 2020-09-06 DIAGNOSIS — I4892 Unspecified atrial flutter: Secondary | ICD-10-CM | POA: Diagnosis not present

## 2020-09-06 DIAGNOSIS — I2510 Atherosclerotic heart disease of native coronary artery without angina pectoris: Secondary | ICD-10-CM | POA: Diagnosis not present

## 2020-09-06 DIAGNOSIS — I110 Hypertensive heart disease with heart failure: Secondary | ICD-10-CM | POA: Diagnosis not present

## 2020-09-06 DIAGNOSIS — Z7902 Long term (current) use of antithrombotics/antiplatelets: Secondary | ICD-10-CM | POA: Diagnosis not present

## 2020-09-06 DIAGNOSIS — K219 Gastro-esophageal reflux disease without esophagitis: Secondary | ICD-10-CM | POA: Diagnosis not present

## 2020-09-06 DIAGNOSIS — E876 Hypokalemia: Secondary | ICD-10-CM | POA: Diagnosis not present

## 2020-09-06 DIAGNOSIS — J441 Chronic obstructive pulmonary disease with (acute) exacerbation: Secondary | ICD-10-CM | POA: Diagnosis not present

## 2020-09-06 DIAGNOSIS — J9621 Acute and chronic respiratory failure with hypoxia: Secondary | ICD-10-CM | POA: Diagnosis not present

## 2020-09-06 DIAGNOSIS — F17210 Nicotine dependence, cigarettes, uncomplicated: Secondary | ICD-10-CM | POA: Diagnosis not present

## 2020-09-06 DIAGNOSIS — H3530 Unspecified macular degeneration: Secondary | ICD-10-CM | POA: Diagnosis not present

## 2020-09-06 DIAGNOSIS — Z20822 Contact with and (suspected) exposure to covid-19: Secondary | ICD-10-CM | POA: Diagnosis not present

## 2020-09-06 DIAGNOSIS — Z85118 Personal history of other malignant neoplasm of bronchus and lung: Secondary | ICD-10-CM | POA: Diagnosis not present

## 2020-09-06 DIAGNOSIS — K250 Acute gastric ulcer with hemorrhage: Secondary | ICD-10-CM | POA: Diagnosis not present

## 2020-09-06 DIAGNOSIS — E1140 Type 2 diabetes mellitus with diabetic neuropathy, unspecified: Secondary | ICD-10-CM | POA: Diagnosis not present

## 2020-09-06 DIAGNOSIS — E119 Type 2 diabetes mellitus without complications: Secondary | ICD-10-CM | POA: Diagnosis not present

## 2020-09-06 DIAGNOSIS — C3491 Malignant neoplasm of unspecified part of right bronchus or lung: Secondary | ICD-10-CM | POA: Diagnosis not present

## 2020-09-06 DIAGNOSIS — I509 Heart failure, unspecified: Secondary | ICD-10-CM | POA: Diagnosis not present

## 2020-09-06 DIAGNOSIS — Z7982 Long term (current) use of aspirin: Secondary | ICD-10-CM | POA: Diagnosis not present

## 2020-09-06 DIAGNOSIS — Z95820 Peripheral vascular angioplasty status with implants and grafts: Secondary | ICD-10-CM | POA: Diagnosis not present

## 2020-09-06 DIAGNOSIS — Z7901 Long term (current) use of anticoagulants: Secondary | ICD-10-CM | POA: Diagnosis not present

## 2020-09-06 DIAGNOSIS — E039 Hypothyroidism, unspecified: Secondary | ICD-10-CM | POA: Diagnosis not present

## 2020-09-06 DIAGNOSIS — K259 Gastric ulcer, unspecified as acute or chronic, without hemorrhage or perforation: Secondary | ICD-10-CM | POA: Diagnosis not present

## 2020-09-06 DIAGNOSIS — N401 Enlarged prostate with lower urinary tract symptoms: Secondary | ICD-10-CM | POA: Diagnosis not present

## 2020-09-06 DIAGNOSIS — J449 Chronic obstructive pulmonary disease, unspecified: Secondary | ICD-10-CM | POA: Diagnosis not present

## 2020-09-06 HISTORY — PX: ESOPHAGOGASTRODUODENOSCOPY (EGD) WITH PROPOFOL: SHX5813

## 2020-09-06 LAB — BPAM RBC
Blood Product Expiration Date: 202207062359
ISSUE DATE / TIME: 202206012259
Unit Type and Rh: 5100

## 2020-09-06 LAB — FERRITIN: Ferritin: 19 ng/mL — ABNORMAL LOW (ref 24–336)

## 2020-09-06 LAB — GLUCOSE, CAPILLARY
Glucose-Capillary: 221 mg/dL — ABNORMAL HIGH (ref 70–99)
Glucose-Capillary: 202 mg/dL — ABNORMAL HIGH (ref 70–99)

## 2020-09-06 LAB — BASIC METABOLIC PANEL
Anion gap: 12 (ref 5–15)
BUN: 16 mg/dL (ref 8–23)
CO2: 29 mmol/L (ref 22–32)
Calcium: 8.8 mg/dL — ABNORMAL LOW (ref 8.9–10.3)
Chloride: 94 mmol/L — ABNORMAL LOW (ref 98–111)
Creatinine, Ser: 0.96 mg/dL (ref 0.61–1.24)
GFR, Estimated: >60 mL/min (ref >60)
Glucose, Bld: 230 mg/dL — ABNORMAL HIGH (ref 70–99)
Potassium: 3.7 mmol/L (ref 3.5–5.1)
Sodium: 135 mmol/L (ref 135–145)

## 2020-09-06 LAB — TYPE AND SCREEN
ABO/RH(D): O POS
Antibody Screen: NEGATIVE
Unit division: 0

## 2020-09-06 LAB — CBC
HCT: 30.5 % — ABNORMAL LOW (ref 39.0–52.0)
Hemoglobin: 8.9 g/dL — ABNORMAL LOW (ref 13.0–17.0)
MCH: 25.4 pg — ABNORMAL LOW (ref 26.0–34.0)
MCHC: 29.2 g/dL — ABNORMAL LOW (ref 30.0–36.0)
MCV: 86.9 fL (ref 80.0–100.0)
Platelets: 446 10*3/uL — ABNORMAL HIGH (ref 150–400)
RBC: 3.51 MIL/uL — ABNORMAL LOW (ref 4.22–5.81)
RDW: 19.3 % — ABNORMAL HIGH (ref 11.5–15.5)
WBC: 6.8 10*3/uL (ref 4.0–10.5)
nRBC: 0 % (ref 0.0–0.2)

## 2020-09-06 LAB — IRON AND TIBC
Iron: 22 ug/dL — ABNORMAL LOW (ref 45–182)
Saturation Ratios: 4 % — ABNORMAL LOW (ref 17.9–39.5)
TIBC: 508 ug/dL — ABNORMAL HIGH (ref 250–450)
UIBC: 486 ug/dL

## 2020-09-06 LAB — VITAMIN B12: Vitamin B-12: 336 pg/mL (ref 180–914)

## 2020-09-06 LAB — FOLATE: Folate: 17.6 ng/mL (ref >5.9)

## 2020-09-06 SURGERY — ESOPHAGOGASTRODUODENOSCOPY (EGD) WITH PROPOFOL
Anesthesia: Monitor Anesthesia Care

## 2020-09-06 MED ORDER — SODIUM CHLORIDE 0.9 % IV SOLN: INTRAVENOUS | Status: DC

## 2020-09-06 MED ORDER — NICOTINE 14 MG/24HR TD PT24: 14.0000 mg | MEDICATED_PATCH | Freq: Every day | TRANSDERMAL | 0 refills | Status: DC

## 2020-09-06 MED ORDER — PROPOFOL 500 MG/50ML IV EMUL
INTRAVENOUS | Status: DC | PRN
  Administered 2020-09-06: 150 ug/kg/min via INTRAVENOUS

## 2020-09-06 MED ORDER — PROPOFOL 10 MG/ML IV BOLUS
INTRAVENOUS | Status: DC | PRN
  Administered 2020-09-06: 20 mg via INTRAVENOUS

## 2020-09-06 MED ORDER — IRON (FERROUS SULFATE) 325 (65 FE) MG PO TABS: 325.0000 mg | ORAL_TABLET | Freq: Two times a day (BID) | ORAL | 0 refills | Status: DC

## 2020-09-06 MED ORDER — LACTATED RINGERS IV SOLN
INTRAVENOUS | Status: DC
  Administered 2020-09-06: 1000 mL via INTRAVENOUS

## 2020-09-06 MED ORDER — PANTOPRAZOLE SODIUM 40 MG PO TBEC: 40.0000 mg | DELAYED_RELEASE_TABLET | Freq: Every day | ORAL | 1 refills | Status: DC

## 2020-09-06 MED ORDER — CHLORHEXIDINE GLUCONATE CLOTH 2 % EX PADS
6.0000 | MEDICATED_PAD | Freq: Every day | CUTANEOUS | Status: DC
  Administered 2020-09-06: 6 via TOPICAL

## 2020-09-06 MED ORDER — PREDNISONE 20 MG PO TABS: 40.0000 mg | ORAL_TABLET | Freq: Every day | ORAL | 0 refills | Status: AC

## 2020-09-06 SURGICAL SUPPLY — 14 items

## 2020-09-06 NOTE — Telephone Encounter (Signed)
Patient aware and verbalized understanding. °

## 2020-09-06 NOTE — Discharge Summary (Signed)
Physician Discharge Summary  John [name] NLZ:[id number] DOB: 02/03/1946 DOA: 09/05/2020  PCP: [name], FNP  Admit date: 09/05/2020 Discharge date: 09/06/2020  Admitted From: home Discharge disposition: home   Recommendations for Outpatient Follow-Up:   1. Smoking cessation 2. Cbc 1 week 3. Iron started   Discharge Diagnosis:   Principal Problem:   Symptomatic anemia Active Problems:   Obstructive sleep apnea treated with continuous positive airway pressure (CPAP)   PAD (peripheral artery disease) (HCC)   Acute on chronic respiratory failure with hypoxia (HCC)   COPD with acute exacerbation (HCC)   Tobacco abuse   Peripheral arterial disease (HCC)   GI bleed   PAF (paroxysmal atrial fibrillation) ([place])   Acute gastric ulcer without hemorrhage or perforation    Discharge Condition: Improved.  Diet recommendation: Low sodium, heart healthy.  Wound care: None.  Code status: Full.   History of Present Illness:   John [name] is a 75 y.o. male with medical history significant of hypertension, dyslipidemia, diastolic CHF, atrial fibrillation on Eliquis, CAD, COPD, chronic respiratory failure on 2-3 L of oxygen as needed, PVD, diabetes mellitus type 2, hypothyroidism non-small cell lung cancer s/p chemotherapy with radiation, and BPH who was sent from his primary care provider office for low hemoglobin.  Patient reports that he had not been feeling well since he was hospitalized from 5/16 and underwent angioplasty and stenting of the right common iliac artery with right common femoral endarterectomy with profundoplasty and bovine pericardial patch angioplasty.  Since getting out of the hospital patient reports that he has been feeling weak and has been having dark brown stools.  Notes associated symptoms of shortness of breath, intermittent productive cough with clear sputum production, wheezing, scrotal swelling, and wife states that he is pale in color.   Following the procedure he had been started on Plavix and had taken the medication open till about 3-4 days ago when he self discontinued it because it he felt it was making him feel cold.  He reports that he takes meloxicam infrequently for pain in his back.  Normally patient only uses his oxygen intermittently while at home and states that he does not use it 24/7.  Due to the symptoms he went to go visit his primary care provider on 08/30/2020 and was noted to have hemoglobin of 9.4 g/dL.  He had a follow-up appointment today for repeat check of his blood counts where his hemoglobin had been noted to drop down to 7.3.  Patient reported having some left-sided chest pain that felt like a cramp earlier today, but states that those symptoms have since resolved.  He denies having any gross blood in his stools, fever,vomiting, or diarrhea.  Patient also notes that he is scheduled to have radiation therapy on June 6, 8, and the 13th after recent PET scan from 5/9 showed further progression of bilateral upper lobe hypermetabolic nodules in metastatic disease.    Hospital Course by Problem:   Symptomatic anemia secondary  Multiple issues: -s/p EGD:  1. Tiny gastric ulcer without bleeding or stigmata.                           2. Otherwise normal EGD                           3. Iron deficiency anemia  4. Blood loss this year mostly postoperative in                            nature (urologic surgery in February, vascular                            surgery in May.                           5. Large hematoma on CT accounting for the majority                            of interval anemia (as opposed to GI bleeding)                           6. No intracolonic lesions on CT. Recommendation:           1. Pantoprazole 40 mg daily indefinitely                           2. Iron replacement therapy. Recommend iron sulfate                            325 mg twice daily with  meals  Respiratory failure with hypoxia secondary to COPD exacerbation:chronic.   -improved -finish prednisone course  Diastolic congestive heart failure: On physical exam patient with at least 1+ pitting edema of the lower extremities.  Last echocardiogram revealed EF of 60- 65% by echocardiogram last month. -Strict I&O's and daily weight -resume home lasix  Peripheral vascular disease: Patient  s/p right common femoral endarterectomy with profundoplasty and bovine patch angioplasty with a right common iliac stent placed on 5/16.  Patient had stopped Plavix 3 to 4 days ago. -Resume Plavix due to recent stent placement and risk stent stenosis -Continue statin  Paroxysmal atrial fibrillation on chronic anticoagulation: Patient appears to be relatively well controlled. -Continue Eliquis  Lung Cancer: Recent PET scan showed further progression of bilateral upper lobe hypermetabolic nodules in metastatic disease. -F/u with Radiation oncology   Diabetes mellitus type 2 with peripheral neuropathy: Last hemoglobin A1c was 7.5 on 09/20/2020.  -resume home regimen  Essential hypertension:  Resume home meds  Thrombocytosis: Acute.  Platelet count elevated at 551. -Continue to monitor  Dyslipidemia -Continue pravastatin and Zetia  Hypothyroidism: TSH was 0.628 on 08/21/2020. -Continue levothyroxine  Tobacco abuse: Patient still reports smoking half pack cigarettes per day on average. -Nicotine patch offered -Continue to counsel on need of cessation of tobacco use  OSA -Continue CPAP nightly     Medical Consultants:    GI  Discharge Exam:   Vitals:   09/06/20 1125 09/06/20 1134  BP: (!) 97/33 (!) 139/56  Pulse:  81  Resp:  16  Temp:    SpO2:  99%   Vitals:   09/06/20 0959 09/06/20 1117 09/06/20 1125 09/06/20 1134  BP: (!) 169/61 (!) 120/43 (!) 97/33 (!) 139/56  Pulse:  81  81  Resp: _0 Temp: (!) 97.5 F (36.4 C) (!) 97.5 F (36.4 C)     TempSrc: Tympanic Axillary    SpO2: 100% 99%  99%    General exam: Appears calm and comfortable.  The results of significant diagnostics from this hospitalization (including imaging, microbiology, ancillary and laboratory) are listed below for reference.     Procedures and Diagnostic Studies:   CT ABDOMEN PELVIS W CONTRAST  Result Date: 09/05/2020 CLINICAL DATA:  Anemia, low hemoglobin post surgery, vascular surgery 08/20/2020; history of hypertension, non-small cell cell lung cancer RIGHT lung, diabetes mellitus, CHF, COPD, smoker EXAM: CT ABDOMEN AND PELVIS WITH CONTRAST TECHNIQUE: Multidetector CT imaging of the abdomen and pelvis was performed using the standard protocol following bolus administration of intravenous contrast. Sagittal and coronal MPR images reconstructed from axial data set. CONTRAST:  23m OMNIPAQUE IOHEXOL 300 MG/ML SOLN IV. No oral contrast. COMPARISON:  None FINDINGS: Lower chest: Minimal RIGHT pleural effusion. Hepatobiliary: Question dependent density/tiny gallstones at lower gallbladder segment. Liver unremarkable. Pancreas: Normal appearance Spleen: Normal appearance Adrenals/Urinary Tract: Adrenal thickening without mass. Posterior RIGHT renal cyst 3.9 x 3.0 cm. Kidneys and ureters otherwise normal. Central low attenuation in prostate gland question prior TURP. Stomach/Bowel: Sigmoid diverticulosis without evidence of diverticulitis. Normal appendix. Stomach and bowel loops otherwise normal appearance. Vascular/Lymphatic: Atherosclerotic calcifications aorta, visceral arteries, coronary arteries, iliac arteries. Aorta normal caliber. Borderline enlarged gastrohepatic ligament lymph node 10 mm diameter image 18 previously 6 mm. No additional adenopathy. Reproductive: Central resection of prostate suspected Other: No free air or free fluid. Low-attenuation fluid collection RIGHT inguinal region 5.0 x 3.6 cm in axial dimensions extending 8 cm length. Additional fluid  extending down the RIGHT inguinal canal into the RIGHT hemiscrotum. This may represent old hematoma, seroma or lymphocele. Cannot exclude infected collections by CT. No hernias. No additional fluid collections. Musculoskeletal: Multilevel degenerative disc and facet disease changes lumbar spine. IMPRESSION: RIGHT inguinal fluid collection 5.0 x 3.6 x 8 cm question old hematoma, seroma or lymphocele. Additional fluid extending down the RIGHT inguinal canal into the RIGHT hemiscrotum. Cannot exclude infected collections by CT. Sigmoid diverticulosis without evidence of diverticulitis. Question cholelithiasis. Extensive atherosclerotic calcifications including coronary arteries. Minimal RIGHT pleural effusion. Borderline enlarged gastrohepatic ligament lymph node 10 mm short axis. Aortic Atherosclerosis (ICD10-I70.0). Electronically Signed   By: MLavonia DanaM.D.   On: 09/05/2020 20:09   DG CHEST PORT 1 VIEW  Result Date: 09/05/2020 CLINICAL DATA:  Shortness of breath for 1 day, initial encounter EXAM: PORTABLE CHEST 1 VIEW COMPARISON:  08/14/2020 FINDINGS: Cardiac shadow is within normal limits. Left chest wall port is noted. Aortic calcifications are seen. The nodular densities seen on the prior exam have increased somewhat in the interval from the prior exam. Additionally fullness in the right hilar and suprahilar region is seen. This was seen on prior PET-CT and felt to be related to post radiation change. No other focal abnormality is noted. No acute bony abnormality is seen. IMPRESSION: Slight increase in the degree of nodularity in the apices bilaterally. Fullness in the right hilar region is noted related to underlying radiation change similar to that seen on recent PET-CT. Electronically Signed   By: MInez CatalinaM.D.   On: 09/05/2020 18:05     Labs:   Basic Metabolic Panel: Recent Labs  Lab 09/05/20 1103 09/06/20 0410  NA 134* 135  K 4.0 3.7  CL 96* 94*  CO2 28 29  GLUCOSE 165* 230*  BUN 18  16  CREATININE 1.19 0.96  CALCIUM 8.7* 8.8*   GFR Estimated Creatinine Clearance: 60.9 mL/min (by C-G formula based on SCr of 0.96 mg/dL). Liver Function Tests: Recent Labs  Lab 09/05/20 1103  AST 17  ALT 12  ALKPHOS 99  BILITOT 0.6  PROT 6.4*  ALBUMIN 3.1*   No results for input(s): LIPASE, AMYLASE in the last 168 hours. No results for input(s): AMMONIA in the last 168 hours. Coagulation profile Recent Labs  Lab 09/05/20 1103  INR 1.1    CBC: Recent Labs  Lab 09/05/20 1103 09/05/20 1700 09/05/20 2123 09/06/20 0410  WBC 10.4  --   --  6.8  NEUTROABS 8.2*  --   --   --   HGB 8.0* 7.8* 7.6* 8.9*  HCT 28.7* 28.1* 26.3* 30.5*  MCV 86.7  --   --  86.9  PLT 551*  --   --  446*   Cardiac Enzymes: No results for input(s): CKTOTAL, CKMB, CKMBINDEX, TROPONINI in the last 168 hours. BNP: Invalid input(s): POCBNP CBG: Recent Labs  Lab 09/05/20 1745 09/05/20 2104 09/06/20 0613 09/06/20 1130  GLUCAP 126* 182* 221* 202*   D-Dimer No results for input(s): DDIMER in the last 72 hours. Hgb A1c No results for input(s): HGBA1C in the last 72 hours. Lipid Profile No results for input(s): CHOL, HDL, LDLCALC, TRIG, CHOLHDL, LDLDIRECT in the last 72 hours. Thyroid function studies No results for input(s): TSH, T4TOTAL, T3FREE, THYROIDAB in the last 72 hours.  Invalid input(s): FREET3 Anemia work up Recent Labs    09/06/20 0410  VITAMINB12 336  FOLATE 17.6  FERRITIN 19*  TIBC 508*  IRON 22*   Microbiology Recent Results (from the past 240 hour(s))  Microscopic Examination     Status: Abnormal   Collection Time: 08/30/20 11:25 AM   Urine  Result Value Ref Range Status   WBC, UA 6-10 (A) 0 - 5 /hpf Final   RBC 0-2 0 - 2 /hpf Final   Epithelial Cells (non renal) None seen 0 - 10 /hpf Final   Casts Present (A) None seen /lpf Final   Cast Type Hyaline casts N/A Final   Bacteria, UA Few None seen/Few Final  Urine Culture     Status: Abnormal   Collection Time:  08/30/20  1:21 PM   Specimen: Urine   UR  Result Value Ref Range Status   Urine Culture, Routine Final report (A)  Final   Organism ID, Bacteria Enterococcus faecalis (A)  Final    Comment: For Enterococcus species, aminoglycosides (except for high-level resistance screening), cephalosporins, clindamycin, and trimethoprim-sulfamethoxazole are not effective clinically. (CLSI, M100-S26, 2016) 50,000-100,000 colony forming units per mL    Antimicrobial Susceptibility Comment  Final    Comment:       ** S = Susceptible; I = Intermediate; R = Resistant **                    P = Positive; N = Negative             MICS are expressed in micrograms per mL    Antibiotic                 RSLT#1    RSLT#2    RSLT#3    RSLT#4 Ciprofloxacin                  S Levofloxacin                   S Nitrofurantoin                 S Penicillin  S Tetracycline                   R Vancomycin                     S   Resp Panel by RT-PCR (Flu A&B, Covid) Nasopharyngeal Swab     Status: None   Collection Time: 09/05/20  3:10 PM   Specimen: Nasopharyngeal Swab; Nasopharyngeal(NP) swabs in vial transport medium  Result Value Ref Range Status   SARS Coronavirus 2 by RT PCR NEGATIVE NEGATIVE Final    Comment: (NOTE) SARS-CoV-2 target nucleic acids are NOT DETECTED.  The SARS-CoV-2 RNA is generally detectable in upper respiratory specimens during the acute phase of infection. The lowest concentration of SARS-CoV-2 viral copies this assay can detect is 138 copies/mL. A negative result does not preclude SARS-Cov-2 infection and should not be used as the sole basis for treatment or other patient management decisions. A negative result may occur with  improper specimen collection/handling, submission of specimen other than nasopharyngeal swab, presence of viral mutation(s) within the areas targeted by this assay, and inadequate number of viral copies(<138 copies/mL). A negative result must be  combined with clinical observations, patient history, and epidemiological information. The expected result is Negative.  Fact Sheet for Patients:  EntrepreneurPulse.com.au  Fact Sheet for Healthcare Providers:  IncredibleEmployment.be  This test is no t yet approved or cleared by the [place] FDA and  has been authorized for detection and/or diagnosis of SARS-CoV-2 by FDA under an Emergency Use Authorization (EUA). This EUA will remain  in effect (meaning this test can be used) for the duration of the COVID-19 declaration under Section 564(b)(1) of the Act, 21 U.S.C.section 360bbb-3(b)(1), unless the authorization is terminated  or revoked sooner.       Influenza A by PCR NEGATIVE NEGATIVE Final   Influenza B by PCR NEGATIVE NEGATIVE Final    Comment: (NOTE) The Xpert Xpress SARS-CoV-2/FLU/RSV plus assay is intended as an aid in the diagnosis of influenza from Nasopharyngeal swab specimens and should not be used as a sole basis for treatment. Nasal washings and aspirates are unacceptable for Xpert Xpress SARS-CoV-2/FLU/RSV testing.  Fact Sheet for Patients: EntrepreneurPulse.com.au  Fact Sheet for Healthcare Providers: IncredibleEmployment.be  This test is not yet approved or cleared by the [place] FDA and has been authorized for detection and/or diagnosis of SARS-CoV-2 by FDA under an Emergency Use Authorization (EUA). This EUA will remain in effect (meaning this test can be used) for the duration of the COVID-19 declaration under Section 564(b)(1) of the Act, 21 U.S.C. section 360bbb-3(b)(1), unless the authorization is terminated or revoked.  Performed at [facility], [place] [address] [place], [place] [address]      Discharge Instructions:   Discharge Instructions    Diet - low sodium heart healthy   Complete by: As directed    Diet Carb Modified   Complete by: As directed     Discharge instructions   Complete by: As directed    Cbc 1 week   Increase activity slowly   Complete by: As directed    No wound care   Complete by: As directed      Allergies as of 09/06/2020      Reactions   Jardiance [empagliflozin] Other (See Comments)   FEELS SLUGGISH, TIRED   Lopressor [metoprolol] Other (See Comments)   Fatigue   Atorvastatin    Muscle aches - tolerating Pravastatin 40 mg MWF  Rosuvastatin    Muscle aches - tolerating Pravastatin 40 mg MWF   Sulfa Antibiotics Swelling   Mouth swelling      Medication List    STOP taking these medications   meloxicam 7.5 MG tablet Commonly known as: MOBIC     TAKE these medications   albuterol 108 (90 Base) MCG/ACT inhaler Commonly known as: VENTOLIN HFA Inhale 2 puffs into the lungs every 4 (four) hours as needed for wheezing or shortness of breath.   apixaban 5 MG Tabs tablet Commonly known as: ELIQUIS Take 1 tablet (5 mg total) by mouth 2 (two) times daily.   cloNIDine 0.1 MG tablet Commonly known as: CATAPRES TAKE 1 TABLET (0.1 MG TOTAL) BY MOUTH 3 (THREE) TIMES DAILY.   clopidogrel 75 MG tablet Commonly known as: PLAVIX Take 1 tablet (75 mg total) by mouth daily at 6 (six) AM.   dutasteride 0.5 MG capsule Commonly known as: AVODART TAKE 1 CAPSULE BY MOUTH EVERY DAY What changed: how much to take   ezetimibe 10 MG tablet Commonly known as: ZETIA Take 1 tablet (10 mg total) by mouth daily.   fluticasone 50 MCG/ACT nasal spray Commonly known as: FLONASE Place 1 spray into both nostrils daily as needed for allergies or rhinitis. What changed: when to take this   furosemide 20 MG tablet Commonly known as: LASIX Take 2 tablets (40 mg total) by mouth 2 (two) times daily. Take 40 mg in AM and 20 mg in afternoon   gabapentin 300 MG capsule Commonly known as: NEURONTIN TAKE 1 CAPSULE BY MOUTH THREE TIMES A DAY What changed: See the new instructions.   HYDROcodone-acetaminophen 5-325 MG  tablet Commonly known as: Norco Take 1 tablet by mouth every 6 (six) hours as needed for moderate pain.   Iron (Ferrous Sulfate) 325 (65 Fe) MG Tabs Take 325 mg by mouth 2 (two) times daily.   levothyroxine 50 MCG tablet Commonly known as: SYNTHROID Take 1 tablet (50 mcg total) by mouth daily.   linaclotide 145 MCG Caps capsule Commonly known as: Linzess Take 1 capsule (145 mcg total) by mouth daily. To regulate bowel movements   losartan 50 MG tablet Commonly known as: COZAAR Take 0.5 tablets (25 mg total) by mouth daily.   metFORMIN 500 MG 24 hr tablet Commonly known as: GLUCOPHAGE-XR Take 1 tablet (500 mg total) by mouth daily with breakfast.   nicotine 14 mg/24hr patch Commonly known as: NICODERM CQ - dosed in mg/24 hours Place 1 patch (14 mg total) onto the skin daily. Start taking on: September 07, 2020   ondansetron 4 MG disintegrating tablet Commonly known as: ZOFRAN-ODT TAKE 1 TABLET BY MOUTH EVERY 8 HOURS AS NEEDED FOR NAUSEA AND VOMITING What changed: See the new instructions.   OneTouch Delica Lancets 38S Misc Use to test blood sugars daily as directed. DX: E11.9   OneTouch Verio Reflect w/Device Kit Use to test blood sugars daily as directed. DX: E11.9   OneTouch Verio test strip Generic drug: glucose blood Use to test blood sugars daily as directed. DX: E11.9   pantoprazole 40 MG tablet Commonly known as: Protonix Take 1 tablet (40 mg total) by mouth daily.   pravastatin 40 MG tablet Commonly known as: PRAVACHOL TAKE 1 TABLET (40 MG TOTAL) BY MOUTH ON MONDAYS , WEDNESDAYS, AND FRIDAYS AS DIRECTED What changed: See the new instructions.   predniSONE 20 MG tablet Commonly known as: DELTASONE Take 2 tablets (40 mg total) by mouth daily with breakfast for 2  days. Start taking on: September 07, 2020   Rybelsus 7 MG Tabs Generic drug: Semaglutide Take 7 mg by mouth daily.   temazepam 7.5 MG capsule Commonly known as: Restoril Take 1 capsule (7.5 mg total) by  mouth at bedtime as needed for sleep.   Uro-MP 118 MG Caps TAKE 1 CAPSULE (118 MG TOTAL) BY MOUTH 2 (TWO) TIMES DAILY AS NEEDED. What changed: See the new instructions.       Follow-up Information    [name], FNP Follow up in 1 week(s).   Specialty: Family Medicine Contact information: [place] [address] [phone]                Time coordinating discharge: 35 min  Signed:  Geradine Girt DO  [facility] 09/06/2020, 12:57 PM

## 2020-09-06 NOTE — Telephone Encounter (Signed)
Patient calling back to speak to [place]. Wants to know what medications he needs to get

## 2020-09-06 NOTE — Op Note (Signed)
[facility] Patient Name: John [name] Procedure Date : 09/06/2020 MRN: [id number] Attending MD: Docia Chuck. Henrene Pastor , MD Date of Birth: 1946/01/18 CSN: [id number] Age: 75 Admit Type: Inpatient Procedure:                Upper GI endoscopy Indications:              Iron deficiency anemia, Heme positive stool Providers:                Docia Chuck. Henrene Pastor, MD, Josie Dixon, RN, Tyrone Apple, Technician, Phill Myron. Proofreader, CRNA Referring MD:             [facility] hospitalist Medicines:                Monitored Anesthesia Care Complications:            No immediate complications. Estimated Blood Loss:     Estimated blood loss: none. Procedure:                Pre-Anesthesia Assessment:                           - Prior to the procedure, a History and Physical                            was performed, and patient medications and                            allergies were reviewed. The patient's tolerance of                            previous anesthesia was also reviewed. The risks                            and benefits of the procedure and the sedation                            options and risks were discussed with the patient.                            All questions were answered, and informed consent                            was obtained. Prior Anticoagulants: The patient has                            taken Eliquis (apixaban), last dose was 1 day prior                            to procedure. ASA Grade Assessment: III - A patient                            with severe systemic disease. After reviewing the  risks and benefits, the patient was deemed in                            satisfactory condition to undergo the procedure.                           After obtaining informed consent, the endoscope was                            passed under direct vision. Throughout the                            procedure, the patient's  blood pressure, pulse, and                            oxygen saturations were monitored continuously. The                            GIF-H190 ([id number] Olympus gastroscope was                            introduced through the mouth, and advanced to the                            second part of duodenum. The upper GI endoscopy was                            accomplished without difficulty. The patient                            tolerated the procedure well. Scope In: Scope Out: Findings:      The esophagus was normal.      One small non-bleeding superficial gastric ulcer with no stigmata of       bleeding was found at the pylorus. The lesion was 2 mm in largest       dimension.      The stomach was otherwise normal.      The examined duodenum was normal.      The cardia and gastric fundus were normal on retroflexion. Impression:               1. Tiny gastric ulcer without bleeding or stigmata.                           2. Otherwise normal EGD                           3. Iron deficiency anemia                           4. Blood loss this year mostly postoperative in                            nature (urologic surgery in February, vascular  surgery in May.                           5. Large hematoma on CT accounting for the majority                            of interval anemia (as opposed to GI bleeding)                           6. No intracolonic lesions on CT. Recommendation:           1. Pantoprazole 40 mg daily indefinitely                           2. Iron replacement therapy. Recommend iron sulfate                            325 mg twice daily with meals                           3. Resume previous diet                           4. Okay for discharge from GI perspective                           5. We will arrange outpatient follow-up with GI                            advanced practitioner in a couple of months.                            Patient will  need colonoscopy at some point to be                            determined, thereafter.                           Discussed with patient. He was provided a copy of                            this report                           Will sign off. Procedure Code(s):        --- Professional ---                           [phone], Esophagogastroduodenoscopy, flexible,                            transoral; diagnostic, including collection of                            specimen(s) by brushing or washing, when performed                            (  separate procedure) Diagnosis Code(s):        --- Professional ---                           K25.9, Gastric ulcer, unspecified as acute or                            chronic, without hemorrhage or perforation                           D50.9, Iron deficiency anemia, unspecified                           R19.5, Other fecal abnormalities CPT copyright 2019 American [employer] Association. All rights reserved. The codes documented in this report are preliminary and upon coder review may  be revised to meet current compliance requirements. Docia Chuck. Henrene Pastor, MD 09/06/2020 11:30:26 AM This report has been signed electronically. Number of Addenda: 0

## 2020-09-06 NOTE — Transfer of Care (Signed)
Immediate Anesthesia Transfer of Care Note  Patient: EFTON THOMLEY  Procedure(s) Performed: ESOPHAGOGASTRODUODENOSCOPY (EGD) WITH PROPOFOL (N/A )  Patient Location: Endoscopy Unit  Anesthesia Type:MAC  Level of Consciousness: sedated, drowsy and patient cooperative  Airway & Oxygen Therapy: Patient Spontanous Breathing and Patient connected to nasal cannula oxygen  Post-op Assessment: Report given to RN, Post -op Vital signs reviewed and stable and Patient moving all extremities X 4  Post vital signs: Reviewed and stable  Last Vitals:  Vitals Value Taken Time  BP 57/44 09/06/20 1115  Temp    Pulse 81 09/06/20 1116  Resp 17 09/06/20 1116  SpO2 97 % 09/06/20 1116  Vitals shown include unvalidated device data.  Last Pain:  Vitals:   09/06/20 0959  TempSrc: Tympanic  PainSc: 0-No pain         Complications: No complications documented.

## 2020-09-06 NOTE — Anesthesia Postprocedure Evaluation (Signed)
Anesthesia Post Note  Patient: John [name]  Procedure(s) Performed: ESOPHAGOGASTRODUODENOSCOPY (EGD) WITH PROPOFOL (N/A )     Patient location during evaluation: Endoscopy Anesthesia Type: MAC Level of consciousness: awake and alert Pain management: pain level controlled Vital Signs Assessment: post-procedure vital signs reviewed and stable Respiratory status: spontaneous breathing, nonlabored ventilation and respiratory function stable Cardiovascular status: blood pressure returned to baseline and stable Postop Assessment: no apparent nausea or vomiting Anesthetic complications: no   No complications documented.  Last Vitals:  Vitals:   09/06/20 1117 09/06/20 1125  BP: (!) 120/43 (!) 97/33  Pulse: 81   Resp: 16   Temp: (!) 36.4 C   SpO2: 99%     Last Pain:  Vitals:   09/06/20 1125  TempSrc:   PainSc: 0-No pain                 Lidia Collum

## 2020-09-06 NOTE — Progress Notes (Signed)
RN gave pt and his wife discharge instructions and they stated understanding. IV has been removed and belongings have been packed. New medications escribed to home pharmacy.

## 2020-09-06 NOTE — Discharge Instructions (Signed)
Would take vitamin C with your iron for better absorption

## 2020-09-06 NOTE — Anesthesia Preprocedure Evaluation (Signed)
Anesthesia Evaluation  Patient identified by MRN, date of birth, ID band Patient awake    Reviewed: Allergy & Precautions, NPO status , Patient's Chart, lab work & pertinent test results  History of Anesthesia Complications Negative for: history of anesthetic complications  Airway Mallampati: II  TM Distance: >3 FB Neck ROM: Full    Dental  (+) Teeth Intact   Pulmonary asthma , sleep apnea , COPD,  oxygen dependent, Current Smoker and Patient abstained from smoking.,    Pulmonary exam normal        Cardiovascular hypertension, Pt. on medications and Pt. on home beta blockers + Peripheral Vascular Disease and +CHF  + Valvular Problems/Murmurs AS  Rhythm:Regular Rate:Normal     Neuro/Psych Anxiety Depression negative neurological ROS     GI/Hepatic negative GI ROS, Neg liver ROS,   Endo/Other  diabetes, Type 2, Oral Hypoglycemic AgentsHypothyroidism   Renal/GU negative Renal ROS  negative genitourinary   Musculoskeletal  (+) Arthritis ,   Abdominal   Peds  Hematology  (+) anemia ,   Anesthesia Other Findings  Echo 08/21/20: 1. Left ventricular ejection fraction, by estimation, is 60 to 65%. The left ventricle has normal function. The left ventricle has no regional wall motion abnormalities. Left ventricular diastolic parameters are indeterminate. 2. Right ventricular systolic function is mildly reduced. The right ventricular size is normal. 3. The mitral valve is abnormal. No evidence of mitral valve regurgitation. The mean mitral valve gradient is 4.0 mmHg with average heart rate of 87 bpm. Moderate mitral annular calcification. 4. The aortic valve is calcified. Aortic valve regurgitation is not visualized. Mild to moderate aortic valve sclerosis/calcification is present. Aortic valve area, by VTI measures 0.91 cm. Aortic valve mean gradient measures 8.0 mmHg. Aortic valve Vmax measures 1.88 m/s. Aortic valve  acceleration time measures 97 msec. There is discordance between the AVA and gradients; DVI is 0.36 with LV stroke volume index of 30; Aortic stenosis may be underestimated in this study. Consider outpatient monitoring if clinically indicated. Comparison(s): A prior study was performed on 01/15/2018. Prior images reviewed side by side. Similar peak aortic gradients.  Reproductive/Obstetrics                             Anesthesia Physical  Anesthesia Plan  ASA: III  Anesthesia Plan: MAC   Post-op Pain Management:    Induction: Intravenous  PONV Risk Score and Plan: 1 and Propofol infusion, TIVA and Treatment may vary due to age or medical condition  Airway Management Planned: Natural Airway, Nasal Cannula and Simple Face Mask  Additional Equipment: None  Intra-op Plan:   Post-operative Plan:   Informed Consent: I have reviewed the patients History and Physical, chart, labs and discussed the procedure including the risks, benefits and alternatives for the proposed anesthesia with the patient or authorized representative who has indicated his/her understanding and acceptance.       Plan Discussed with:   Anesthesia Plan Comments:        Anesthesia Quick Evaluation

## 2020-09-06 NOTE — Interval H&P Note (Signed)
History and Physical Interval Note:  09/06/2020 10:56 AM  John [name]  has presented today for surgery, with the diagnosis of Anemia. + FOBT..  The various methods of treatment have been discussed with the patient and family. After consideration of risks, benefits and other options for treatment, the patient has consented to  Procedure(s): ESOPHAGOGASTRODUODENOSCOPY (EGD) WITH PROPOFOL (N/A) as a surgical intervention.  The patient's history has been reviewed, patient examined, no change in status, stable for surgery.  I have reviewed the patient's chart and labs.  Questions were answered to the patient's satisfaction.     John [name]

## 2020-09-07 ENCOUNTER — Ambulatory Visit (INDEPENDENT_AMBULATORY_CARE_PROVIDER_SITE_OTHER): Payer: Medicare Other | Admitting: Nurse Practitioner

## 2020-09-07 ENCOUNTER — Other Ambulatory Visit: Payer: Self-pay

## 2020-09-07 ENCOUNTER — Ambulatory Visit: Payer: Medicare Other | Admitting: Radiation Oncology

## 2020-09-07 ENCOUNTER — Encounter: Payer: Self-pay | Admitting: Nurse Practitioner

## 2020-09-07 VITALS — BP 138/70 | HR 110 | Temp 97.8°F | Ht 67.0 in | Wt 167.0 lb

## 2020-09-07 DIAGNOSIS — F5101 Primary insomnia: Secondary | ICD-10-CM | POA: Diagnosis not present

## 2020-09-07 DIAGNOSIS — I110 Hypertensive heart disease with heart failure: Secondary | ICD-10-CM | POA: Diagnosis not present

## 2020-09-07 DIAGNOSIS — Z48812 Encounter for surgical aftercare following surgery on the circulatory system: Secondary | ICD-10-CM | POA: Diagnosis not present

## 2020-09-07 DIAGNOSIS — E1151 Type 2 diabetes mellitus with diabetic peripheral angiopathy without gangrene: Secondary | ICD-10-CM | POA: Diagnosis not present

## 2020-09-07 DIAGNOSIS — I509 Heart failure, unspecified: Secondary | ICD-10-CM | POA: Diagnosis not present

## 2020-09-07 DIAGNOSIS — K5900 Constipation, unspecified: Secondary | ICD-10-CM | POA: Diagnosis not present

## 2020-09-07 DIAGNOSIS — I4892 Unspecified atrial flutter: Secondary | ICD-10-CM | POA: Diagnosis not present

## 2020-09-07 DIAGNOSIS — Z95820 Peripheral vascular angioplasty status with implants and grafts: Secondary | ICD-10-CM | POA: Diagnosis not present

## 2020-09-07 MED ORDER — POLYETHYLENE GLYCOL 3350 17 G PO PACK: 17.0000 g | PACK | Freq: Every day | ORAL | 0 refills | Status: DC

## 2020-09-07 NOTE — Progress Notes (Signed)
Acute Office Visit  Subjective:    Patient ID: John [name], male    DOB: 1946-02-18, 75 y.o.   MRN: [id number]  Chief Complaint  Patient presents with  . Constipation  . Insomnia    Constipation This is a new problem. Episode onset: The past 4 days. The problem is unchanged. The patient is not on a high fiber diet. He does not exercise regularly. There has been adequate water intake. Pertinent negatives include no abdominal pain, bloating, difficulty urinating, fecal incontinence, nausea, rectal pain or vomiting. Risk factors include change in medication usage/dosage. Treatments tried: linzess. The treatment provided no relief.  Insomnia Primary symptoms: sleep disturbance, difficulty falling asleep.  The current episode started one month. The onset quality is gradual. The problem occurs nightly. The problem is unchanged. The symptoms are aggravated by anxiety. Past treatments include medication. The treatment provided no relief. PMH includes: hypertension, depression.    Past Medical History:  Diagnosis Date  . Adenocarcinoma of lung, right ([place]) 04/18/2016  . Anxiety   . Arthritis   . Asthma   . BPH (benign prostatic hyperplasia)    with urinary retention 02/06/20  . CHF (congestive heart failure) ([place])   . COPD (chronic obstructive pulmonary disease) ([place])   . Depression   . Diabetes mellitus without complication (H[facility])    no meds  . Diabetes mellitus, type II ([place])   . DM (diabetes mellitus) ([place]) 07/09/2016  . Dyspnea   . History of kidney stones   . History of radiation therapy 06/25/16-08/20/16   right lung  . Hyperlipidemia   . Hypertension   . Hypertension   . Hypothyroidism   . Macular degeneration   . Neuropathy   . Non-small cell lung cancer, right ([place]) 04/18/2016  . Peripheral vascular disease ([place])   . Prostatitis   . Pulmonary nodule, left 07/16/2016  . Sleep apnea    cpap    Past Surgical History:  Procedure Laterality Date  . ABDOMINAL AORTOGRAM  W/LOWER EXTREMITY Left 02/06/2020   Procedure: ABDOMINAL AORTOGRAM W/LOWER EXTREMITY;  Surgeon: Lorretta Harp, MD;  Location: [facility];  Service: Cardiovascular;  Laterality: Left;  . CATARACT EXTRACTION, BILATERAL Bilateral   . ENDARTERECTOMY FEMORAL Right 08/20/2020   Procedure: ENDARTERECTOMY  RIGHT FEMORAL ARTERY;  Surgeon: Marty Heck, MD;  Location: [place];  Service: Vascular;  Laterality: Right;  . INSERTION OF ILIAC STENT Right 08/20/2020   Procedure: RETROGRADE INSERTION OF RIGHT ILIAC STENT;  Surgeon: Marty Heck, MD;  Location: [facility];  Service: Vascular;  Laterality: Right;  . INTRAOPERATIVE ARTERIOGRAM Right 08/20/2020   Procedure: INTRA OPERATIVE ARTERIOGRAM ILIAC;  Surgeon: Marty Heck, MD;  Location: [place];  Service: Vascular;  Laterality: Right;  . PATCH ANGIOPLASTY Right 08/20/2020   Procedure: PATCH ANGIOPLASTY RIGHT FEMORAL ARTERY;  Surgeon: Marty Heck, MD;  Location: [facility];  Service: Vascular;  Laterality: Right;  . PORTACATH PLACEMENT Left 06/13/2016   Procedure: INSERTION PORT-A-CATH;  Surgeon: Aviva Signs, MD;  Location: [facility];  Service: General;  Laterality: Left;  . TRANSURETHRAL RESECTION OF PROSTATE N/A 05/31/2020   Procedure: TRANSURETHRAL RESECTION OF THE PROSTATE (TURP);  Surgeon: Cleon Gustin, MD;  Location: [facility];  Service: Urology;  Laterality: N/A;  . VIDEO BRONCHOSCOPY WITH ENDOBRONCHIAL NAVIGATION N/A 05/28/2016   Procedure: VIDEO BRONCHOSCOPY WITH ENDOBRONCHIAL NAVIGATION;  Surgeon: [name], MD;  Location: [facility];  Service: Thoracic;  Laterality: N/A;  . VIDEO BRONCHOSCOPY WITH ENDOBRONCHIAL ULTRASOUND  N/A 05/28/2016   Procedure: VIDEO BRONCHOSCOPY WITH ENDOBRONCHIAL ULTRASOUND;  Surgeon: [name], MD;  Location: [facility];  Service: Thoracic;  Laterality: N/A;    Family History  Problem Relation Age of Onset  . Hypertension Mother   . Diabetes Father   . Heart disease Father   .  Stroke Father   . Hypertension Sister     Social History   Socioeconomic History  . Marital status: Divorced    Spouse name: Not on file  . Number of children: 5  . Years of education: 10  . Highest education level: 10th grade  Occupational History  . Occupation: Retired  Tobacco Use  . Smoking status: Current Every Day Smoker    Packs/day: 0.50    Years: 55.00    Pack years: 27.50    Types: Cigarettes    Start date: 03/11/1961  . Smokeless tobacco: Never Used  . Tobacco comment: 1/2 pack to 1 pack per day 12/14/19  Vaping Use  . Vaping Use: Never used  Substance and Sexual Activity  . Alcohol use: No  . Drug use: No  . Sexual activity: Yes    Birth control/protection: None  Other Topics Concern  . Not on file  Social History Narrative  . Not on file   Social Determinants of Health   Financial Resource Strain: Not on file  Food Insecurity: Not on file  Transportation Needs: Not on file  Physical Activity: Not on file  Stress: Not on file  Social Connections: Not on file  Intimate Partner Violence: Not on file    Outpatient Medications Prior to Visit  Medication Sig Dispense Refill  . albuterol (VENTOLIN HFA) 108 (90 Base) MCG/ACT inhaler Inhale 2 puffs into the lungs every 4 (four) hours as needed for wheezing or shortness of breath. 8 g 6  . apixaban (ELIQUIS) 5 MG TABS tablet Take 1 tablet (5 mg total) by mouth 2 (two) times daily. 60 tablet 3  . Blood Glucose Monitoring Suppl (ONETOUCH VERIO REFLECT) w/Device KIT Use to test blood sugars daily as directed. DX: E11.9 1 kit 1  . cloNIDine (CATAPRES) 0.1 MG tablet TAKE 1 TABLET (0.1 MG TOTAL) BY MOUTH 3 (THREE) TIMES DAILY. 270 tablet 0  . clopidogrel (PLAVIX) 75 MG tablet Take 1 tablet (75 mg total) by mouth daily at 6 (six) AM. 30 tablet 11  . dutasteride (AVODART) 0.5 MG capsule TAKE 1 CAPSULE BY MOUTH EVERY DAY (Patient taking differently: Take 0.5 mg by mouth daily.) 90 capsule 1  . ezetimibe (ZETIA) 10 MG  tablet Take 1 tablet (10 mg total) by mouth daily. 30 tablet 1  . fluticasone (FLONASE) 50 MCG/ACT nasal spray Place 1 spray into both nostrils daily as needed for allergies or rhinitis. (Patient taking differently: Place 1 spray into both nostrils daily.) 18.2 mL 2  . furosemide (LASIX) 20 MG tablet Take 2 tablets (40 mg total) by mouth 2 (two) times daily. Take 40 mg in AM and 20 mg in afternoon 180 tablet 2  . gabapentin (NEURONTIN) 300 MG capsule TAKE 1 CAPSULE BY MOUTH THREE TIMES A DAY (Patient taking differently: Take 300 mg by mouth 3 (three) times daily.) 270 capsule 0  . glucose blood (ONETOUCH VERIO) test strip Use to test blood sugars daily as directed. DX: E11.9 100 each 12  . HYDROcodone-acetaminophen (NORCO) 5-325 MG tablet Take 1 tablet by mouth every 6 (six) hours as needed for moderate pain. 20 tablet 0  . Iron, Ferrous Sulfate,  325 (65 Fe) MG TABS Take 325 mg by mouth 2 (two) times daily. 60 tablet 0  . linaclotide (LINZESS) 145 MCG CAPS capsule Take 1 capsule (145 mcg total) by mouth daily. To regulate bowel movements 30 capsule 5  . losartan (COZAAR) 50 MG tablet Take 0.5 tablets (25 mg total) by mouth daily. 45 tablet 3  . metFORMIN (GLUCOPHAGE-XR) 500 MG 24 hr tablet Take 1 tablet (500 mg total) by mouth daily with breakfast. 90 tablet 1  . Meth-Hyo-M Bl-Na Phos-Ph Sal (URO-MP) 118 MG CAPS TAKE 1 CAPSULE (118 MG TOTAL) BY MOUTH 2 (TWO) TIMES DAILY AS NEEDED. (Patient taking differently: Take 118 mg by mouth daily as needed (Uninary burning).) 30 capsule 3  . nicotine (NICODERM CQ - DOSED IN MG/24 HOURS) 14 mg/24hr patch Place 1 patch (14 mg total) onto the skin daily. 28 patch 0  . ondansetron (ZOFRAN-ODT) 4 MG disintegrating tablet TAKE 1 TABLET BY MOUTH EVERY 8 HOURS AS NEEDED FOR NAUSEA AND VOMITING (Patient taking differently: Take 4 mg by mouth every 8 (eight) hours as needed for vomiting or nausea.) 20 tablet 0  . OneTouch Delica Lancets 07P MISC Use to test blood sugars  daily as directed. DX: E11.9 100 each 1  . pantoprazole (PROTONIX) 40 MG tablet Take 1 tablet (40 mg total) by mouth daily. 30 tablet 1  . pravastatin (PRAVACHOL) 40 MG tablet TAKE 1 TABLET (40 MG TOTAL) BY MOUTH ON MONDAYS , WEDNESDAYS, AND FRIDAYS AS DIRECTED (Patient taking differently: Take 40 mg by mouth See admin instructions. Take one tablet by  mouth on Monday, Wednesday and Fridays) 48 tablet 3  . predniSONE (DELTASONE) 20 MG tablet Take 2 tablets (40 mg total) by mouth daily with breakfast for 2 days. 4 tablet 0  . Semaglutide (RYBELSUS) 7 MG TABS Take 7 mg by mouth daily. 90 tablet 1  . temazepam (RESTORIL) 7.5 MG capsule Take 1 capsule (7.5 mg total) by mouth at bedtime as needed for sleep. 30 capsule 1  . levothyroxine (SYNTHROID) 50 MCG tablet Take 1 tablet (50 mcg total) by mouth daily. 30 tablet 2   No facility-administered medications prior to visit.    Allergies  Allergen Reactions  . Jardiance [Empagliflozin] Other (See Comments)    FEELS SLUGGISH, TIRED  . Lopressor [Metoprolol] Other (See Comments)    Fatigue  . Atorvastatin     Muscle aches - tolerating Pravastatin 40 mg MWF  . Rosuvastatin     Muscle aches - tolerating Pravastatin 40 mg MWF  . Sulfa Antibiotics Swelling    Mouth swelling    Review of Systems  Constitutional: Negative.   HENT: Negative.   Gastrointestinal: Positive for constipation. Negative for abdominal pain, bloating, nausea, rectal pain and vomiting.  Genitourinary: Negative for difficulty urinating.  Psychiatric/Behavioral: Positive for depression and sleep disturbance. The patient has insomnia.   All other systems reviewed and are negative.      Objective:    Physical Exam Vitals and nursing note reviewed.  Constitutional:      Appearance: Normal appearance.  HENT:     Head: Normocephalic.     Nose: Nose normal.     Mouth/Throat:     Mouth: Mucous membranes are moist.     Pharynx: Oropharynx is clear.  Eyes:      Conjunctiva/sclera: Conjunctivae normal.  Cardiovascular:     Rate and Rhythm: Normal rate and regular rhythm.     Pulses: Normal pulses.     Heart sounds: Normal heart  sounds.  Pulmonary:     Effort: Pulmonary effort is normal.     Breath sounds: Normal breath sounds.  Abdominal:     General: Bowel sounds are normal.  Skin:    Findings: No rash.  Neurological:     Mental Status: He is alert and oriented to person, place, and time.  Psychiatric:        Behavior: Behavior normal.     BP 138/70   Pulse (!) 110   Temp 97.8 F (36.6 C) (Temporal)   Ht 5' 7"  (1.702 m)   Wt 167 lb (75.8 kg)   SpO2 97%   BMI 26.16 kg/m  Wt Readings from Last 3 Encounters:  09/07/20 167 lb (75.8 kg)  09/05/20 167 lb 6.4 oz (75.9 kg)  09/04/20 166 lb 11.2 oz (75.6 kg)    Health Maintenance Due  Topic Date Due  . Pneumococcal Vaccine 22-71 Years old (1 of 4 - PCV13) Never done  . Zoster Vaccines- Shingrix (1 of 2) Never done  . COVID-19 Vaccine (4 - Booster for Moderna series) 05/02/2020    There are no preventive care reminders to display for this patient.   Lab Results  Component Value Date   TSH 0.628 08/21/2020   Lab Results  Component Value Date   WBC 6.8 09/06/2020   HGB 8.9 (L) 09/06/2020   HCT 30.5 (L) 09/06/2020   MCV 86.9 09/06/2020   PLT 446 (H) 09/06/2020   Lab Results  Component Value Date   NA 135 09/06/2020   K 3.7 09/06/2020   CO2 29 09/06/2020   GLUCOSE 230 (H) 09/06/2020   BUN 16 09/06/2020   CREATININE 0.96 09/06/2020   BILITOT 0.6 09/05/2020   ALKPHOS 99 09/05/2020   AST 17 09/05/2020   ALT 12 09/05/2020   PROT 6.4 (L) 09/05/2020   ALBUMIN 3.1 (L) 09/05/2020   CALCIUM 8.8 (L) 09/06/2020   ANIONGAP 12 09/06/2020   EGFR 75 08/30/2020   Lab Results  Component Value Date   CHOL 126 08/21/2020   Lab Results  Component Value Date   HDL 28 (L) 08/21/2020   Lab Results  Component Value Date   LDLCALC 80 08/21/2020   Lab Results  Component Value  Date   TRIG 89 08/21/2020   Lab Results  Component Value Date   CHOLHDL 4.5 08/21/2020   Lab Results  Component Value Date   HGBA1C 7.5 (H) 08/20/2020       Assessment & Plan:   Problem List Items Addressed This Visit      Other   Primary insomnia    Unresolved symptoms of insomnia, patient currently on temazepam 7.5 mg tablet by mouth at bedtime.  Patient is reporting no therapeutic effect.  Due to patient's age I will not increase temazepam today.  Encourage patient to start better sleep hygiene, not drinking caffeine before bed, tender and cough television and cell phone few hours before bedtime.  Encouraged patient to use melatonin over-the-counter for a few more weeks and follow-up if symptoms are worse or unresolved.      Constipation - Primary    Constipation is unresolved.  Patient reports recently was in the hospital and has been on iron supplement.  I provided education to patient that iron supplement usually increase constipation, advised patient to increase hydration and fiber in diet.  MiraLAX 17 g ordered once daily as needed.  Patient is currently on Linzess. Education provided to patient with printed handouts given.  Follow-up as needed  with worsening or unresolved symptoms.      Relevant Medications   polyethylene glycol (MIRALAX) 17 g packet       Meds ordered this encounter  Medications  . polyethylene glycol (MIRALAX) 17 g packet    Sig: Take 17 g by mouth daily.    Dispense:  14 each    Refill:  0    Order Specific Question:   Supervising Provider    Answer:   Janora Norlander [[id number]]     Ivy Lynn, NP

## 2020-09-07 NOTE — Assessment & Plan Note (Signed)
Constipation is unresolved.  Patient reports recently was in the hospital and has been on iron supplement.  I provided education to patient that iron supplement usually increase constipation, advised patient to increase hydration and fiber in diet.  MiraLAX 17 g ordered once daily as needed.  Patient is currently on Linzess. Education provided to patient with printed handouts given.  Follow-up as needed with worsening or unresolved symptoms.

## 2020-09-07 NOTE — Patient Instructions (Signed)
Insomnia Insomnia is a sleep disorder that makes it difficult to fall asleep or stay asleep. Insomnia can cause fatigue, low energy, difficulty concentrating, mood swings, and poor performance at work or school. There are three different ways to classify insomnia:  Difficulty falling asleep.  Difficulty staying asleep.  Waking up too early in the morning. Any type of insomnia can be long-term (chronic) or short-term (acute). Both are common. Short-term insomnia usually lasts for three months or less. Chronic insomnia occurs at least three times a week for longer than three months. What are the causes? Insomnia may be caused by another condition, situation, or substance, such as:  Anxiety.  Certain medicines.  Gastroesophageal reflux disease (GERD) or other gastrointestinal conditions.  Asthma or other breathing conditions.  Restless legs syndrome, sleep apnea, or other sleep disorders.  Chronic pain.  Menopause.  Stroke.  Abuse of alcohol, tobacco, or illegal drugs.  Mental health conditions, such as depression.  Caffeine.  Neurological disorders, such as Alzheimer's disease.  An overactive thyroid (hyperthyroidism). Sometimes, the cause of insomnia may not be known. What increases the risk? Risk factors for insomnia include:  Gender. Women are affected more often than men.  Age. Insomnia is more common as you get older.  Stress.  Lack of exercise.  Irregular work schedule or working night shifts.  Traveling between different time zones.  Certain medical and mental health conditions. What are the signs or symptoms? If you have insomnia, the main symptom is having trouble falling asleep or having trouble staying asleep. This may lead to other symptoms, such as:  Feeling fatigued or having low energy.  Feeling nervous about going to sleep.  Not feeling rested in the morning.  Having trouble concentrating.  Feeling irritable, anxious, or depressed. How  is this diagnosed? This condition may be diagnosed based on:  Your symptoms and medical history. Your health care provider may ask about: ? Your sleep habits. ? Any medical conditions you have. ? Your mental health.  A physical exam. How is this treated? Treatment for insomnia depends on the cause. Treatment may focus on treating an underlying condition that is causing insomnia. Treatment may also include:  Medicines to help you sleep.  Counseling or therapy.  Lifestyle adjustments to help you sleep better. Follow these instructions at home: Eating and drinking  Limit or avoid alcohol, caffeinated beverages, and cigarettes, especially close to bedtime. These can disrupt your sleep.  Do not eat a large meal or eat spicy foods right before bedtime. This can lead to digestive discomfort that can make it hard for you to sleep.   Sleep habits  Keep a sleep diary to help you and your health care provider figure out what could be causing your insomnia. Write down: ? When you sleep. ? When you wake up during the night. ? How well you sleep. ? How rested you feel the next day. ? Any side effects of medicines you are taking. ? What you eat and drink.  Make your bedroom a dark, comfortable place where it is easy to fall asleep. ? Put up shades or blackout curtains to block light from outside. ? Use a white noise machine to block noise. ? Keep the temperature cool.  Limit screen use before bedtime. This includes: ? Watching TV. ? Using your smartphone, tablet, or computer.  Stick to a routine that includes going to bed and waking up at the same times every day and night. This can help you fall asleep faster. Consider  making a quiet activity, such as reading, part of your nighttime routine.  Try to avoid taking naps during the day so that you sleep better at night.  Get out of bed if you are still awake after 15 minutes of trying to sleep. Keep the lights down, but try reading or  doing a quiet activity. When you feel sleepy, go back to bed.   General instructions  Take over-the-counter and prescription medicines only as told by your health care provider.  Exercise regularly, as told by your health care provider. Avoid exercise starting several hours before bedtime.  Use relaxation techniques to manage stress. Ask your health care provider to suggest some techniques that may work well for you. These may include: ? Breathing exercises. ? Routines to release muscle tension. ? Visualizing peaceful scenes.  Make sure that you drive carefully. Avoid driving if you feel very sleepy.  Keep all follow-up visits as told by your health care provider. This is important. Contact a health care provider if:  You are tired throughout the day.  You have trouble in your daily routine due to sleepiness.  You continue to have sleep problems, or your sleep problems get worse. Get help right away if:  You have serious thoughts about hurting yourself or someone else. If you ever feel like you may hurt yourself or others, or have thoughts about taking your own life, get help right away. You can go to your nearest emergency department or call:  Your local emergency services (911 in the [place]  A suicide crisis helpline, such as the [facility] at 781-189-1112. This is open 24 hours a day. Summary  Insomnia is a sleep disorder that makes it difficult to fall asleep or stay asleep.  Insomnia can be long-term (chronic) or short-term (acute).  Treatment for insomnia depends on the cause. Treatment may focus on treating an underlying condition that is causing insomnia.  Keep a sleep diary to help you and your health care provider figure out what could be causing your insomnia. This information is not intended to replace advice given to you by your health care provider. Make sure you discuss any questions you have with your health care provider. Document  Revised: 02/02/2020 Document Reviewed: 02/02/2020 [employer] Patient Education  2021 [employer]. Constipation, Adult Constipation is when a person has trouble pooping (having a bowel movement). When you have this condition, you may poop fewer than 3 times a week. Your poop (stool) may also be dry, hard, or bigger than normal. Follow these instructions at home: Eating and drinking  Eat foods that have a lot of fiber, such as: ? Fresh fruits and vegetables. ? Whole grains. ? Beans.  Eat less of foods that are low in fiber and high in fat and sugar, such as: ? [place] fries. ? Hamburgers. ? Cookies. ? Candy. ? Soda.  Drink enough fluid to keep your pee (urine) pale yellow.   General instructions  Exercise regularly or as told by your doctor. Try to do 150 minutes of exercise each week.  Go to the restroom when you feel like you need to poop. Do not hold it in.  Take over-the-counter and prescription medicines only as told by your doctor. These include any fiber supplements.  When you poop: ? Do deep breathing while relaxing your lower belly (abdomen). ? Relax your pelvic floor. The pelvic floor is a group of muscles that support the rectum, bladder, and intestines (as well as the uterus in  women).  Watch your condition for any changes. Tell your doctor if you notice any.  Keep all follow-up visits as told by your doctor. This is important. Contact a doctor if:  You have pain that gets worse.  You have a fever.  You have not pooped for 4 days.  You vomit.  You are not hungry.  You lose weight.  You are bleeding from the opening of the butt (anus).  You have thin, pencil-like poop. Get help right away if:  You have a fever, and your symptoms suddenly get worse.  You leak poop or have blood in your poop.  Your belly feels hard or bigger than normal (bloated).  You have very bad belly pain.  You feel dizzy or you faint. Summary  Constipation is when a person  poops fewer than 3 times a week, has trouble pooping, or has poop that is dry, hard, or bigger than normal.  Eat foods that have a lot of fiber.  Drink enough fluid to keep your pee (urine) pale yellow.  Take over-the-counter and prescription medicines only as told by your doctor. These include any fiber supplements. This information is not intended to replace advice given to you by your health care provider. Make sure you discuss any questions you have with your health care provider. Document Revised: 02/09/2019 Document Reviewed: 02/09/2019 Elsevier Patient Education  [place].

## 2020-09-07 NOTE — Assessment & Plan Note (Signed)
Unresolved symptoms of insomnia, patient currently on temazepam 7.5 mg tablet by mouth at bedtime.  Patient is reporting no therapeutic effect.  Due to patient's age I will not increase temazepam today.  Encourage patient to start better sleep hygiene, not drinking caffeine before bed, tender and cough television and cell phone few hours before bedtime.  Encouraged patient to use melatonin over-the-counter for a few more weeks and follow-up if symptoms are worse or unresolved.

## 2020-09-09 NOTE — H&P (View-Only) (Signed)
Cardiology Office Note:   Date:  09/12/2020  NAME:  JAVIEN TESCH    MRN: [id number] DOB:  September 21, 1945   PCP:  [name], FNP  Cardiologist:  None  Electrophysiologist:  None   Referring MD: [name], FNP   Chief Complaint  Patient presents with  . Atrial Flutter    History of Present Illness:   SANDER REMEDIOS is a 75 y.o. male with a hx of atrial flutter, PAD, DM, COPD, non-small lung cell cancer with recurrence who presents for follow-up. Seen for atrial flutter after his procedure however the timing of this was unable to be determined. He was discharged with eliquis with plans for outpatient DCCV. Seen in the ER 09/05/2020 for new onset anemia and found to have R inguinal hematoma. He did have an EKG with a small gastric ulcer. AC was restarted. Had LE edema and started on lasix.   His EKG shows he is still in atrial flutter.  He denies any symptoms from this.  Heart rate 107 on EKG.  He was unable to tolerate metoprolol.  His blood pressure in the office is 124/56.  He is still having lower extremity edema.  His Eliquis was held for a few days due to his concerns for GI bleed.  He apparently was found to have a right inguinal hematoma related to his recent vascular procedure.  He is back on Eliquis and Plavix.  We discussed that I would like to pursue a cardioversion.  He is in atrial flutter.  We will set him up in roughly 3 weeks from now.  He was encouraged to miss no doses.  He is still having increased edema.  He is on Lasix 40 mg twice daily.  I would like to recheck a CMP today as well as switching to torsemide.  This will be more predictable and allow for him to have better fluid removal.  I did counsel him extensively on salt restriction.  He is eating a lot of fried foods as well as packaged foods.  I suspect he is getting a lot of salt from his diet.  He is also not active.  He also has recurrence of his small cell lung cancer.  He is going to radiation therapy  today.  It appears he had stage III disease and now with recurrence.  It appears the mainstay of his treatment is radiation.  Unclear if this is palliative or not.  His most recent lipid profile shows his LDL cholesterol is 80.  He cannot tolerate Crestor or Lipitor.  He is on pravastatin every other day.  He is also on Zetia.  We will plan to discuss a PCSK9 inhibitor with him at some point.  Problem List 1. Coronary calcifications 2. Atrial flutter  -Dx 08/21/2020 -CHADSVASC=4 (age, DM, PAD, HTN) 3. PAD -R femoral endarterectomy 08/20/2020 -R common iliac artery stent 08/20/2020 4. Non-small cell lung CA -with recurrence  5. COPD 6. Tobacco abuse 7. Gastric ulcer  -09/05/2020 8. DM  -A1c 7.5 9. HLD -T chol 126, HDL 28, LDL 80, TG 89  Past Medical History: Past Medical History:  Diagnosis Date  . Adenocarcinoma of lung, right ([place]) 04/18/2016  . Anxiety   . Arthritis   . Asthma   . BPH (benign prostatic hyperplasia)    with urinary retention 02/06/20  . CHF (congestive heart failure) ([place])   . COPD (chronic obstructive pulmonary disease) ([place])   . Depression   . Diabetes mellitus without  complication (HCC)    no meds  . Diabetes mellitus, type II ([place])   . DM (diabetes mellitus) ([place]) 07/09/2016  . Dyspnea   . History of kidney stones   . History of radiation therapy 06/25/16-08/20/16   right lung  . Hyperlipidemia   . Hypertension   . Hypertension   . Hypothyroidism   . Macular degeneration   . Neuropathy   . Non-small cell lung cancer, right ([place]) 04/18/2016  . Peripheral vascular disease ([place])   . Prostatitis   . Pulmonary nodule, left 07/16/2016  . Sleep apnea    cpap    Past Surgical History: Past Surgical History:  Procedure Laterality Date  . ABDOMINAL AORTOGRAM W/LOWER EXTREMITY Left 02/06/2020   Procedure: ABDOMINAL AORTOGRAM W/LOWER EXTREMITY;  Surgeon: Lorretta Harp, MD;  Location: [facility];  Service: Cardiovascular;  Laterality: Left;  .  CATARACT EXTRACTION, BILATERAL Bilateral   . ENDARTERECTOMY FEMORAL Right 08/20/2020   Procedure: ENDARTERECTOMY  RIGHT FEMORAL ARTERY;  Surgeon: Marty Heck, MD;  Location: [facility];  Service: Vascular;  Laterality: Right;  . ESOPHAGOGASTRODUODENOSCOPY (EGD) WITH PROPOFOL N/A 09/06/2020   Procedure: ESOPHAGOGASTRODUODENOSCOPY (EGD) WITH PROPOFOL;  Surgeon: Irene Shipper, MD;  Location: [facility];  Service: Endoscopy;  Laterality: N/A;  . INSERTION OF ILIAC STENT Right 08/20/2020   Procedure: RETROGRADE INSERTION OF RIGHT ILIAC STENT;  Surgeon: Marty Heck, MD;  Location: [facility];  Service: Vascular;  Laterality: Right;  . INTRAOPERATIVE ARTERIOGRAM Right 08/20/2020   Procedure: INTRA OPERATIVE ARTERIOGRAM ILIAC;  Surgeon: Marty Heck, MD;  Location: [facility];  Service: Vascular;  Laterality: Right;  . PATCH ANGIOPLASTY Right 08/20/2020   Procedure: PATCH ANGIOPLASTY RIGHT FEMORAL ARTERY;  Surgeon: Marty Heck, MD;  Location: [facility];  Service: Vascular;  Laterality: Right;  . PORTACATH PLACEMENT Left 06/13/2016   Procedure: INSERTION PORT-A-CATH;  Surgeon: Aviva Signs, MD;  Location: [facility];  Service: General;  Laterality: Left;  . TRANSURETHRAL RESECTION OF PROSTATE N/A 05/31/2020   Procedure: TRANSURETHRAL RESECTION OF THE PROSTATE (TURP);  Surgeon: Cleon Gustin, MD;  Location: [facility];  Service: Urology;  Laterality: N/A;  . VIDEO BRONCHOSCOPY WITH ENDOBRONCHIAL NAVIGATION N/A 05/28/2016   Procedure: VIDEO BRONCHOSCOPY WITH ENDOBRONCHIAL NAVIGATION;  Surgeon: [name], MD;  Location: [facility];  Service: Thoracic;  Laterality: N/A;  . VIDEO BRONCHOSCOPY WITH ENDOBRONCHIAL ULTRASOUND N/A 05/28/2016   Procedure: VIDEO BRONCHOSCOPY WITH ENDOBRONCHIAL ULTRASOUND;  Surgeon: [name], MD;  Location: [facility];  Service: Thoracic;  Laterality: N/A;    Current Medications: Current Meds  Medication Sig  . albuterol (VENTOLIN HFA) 108 (90 Base) MCG/ACT  inhaler Inhale 2 puffs into the lungs every 4 (four) hours as needed for wheezing or shortness of breath.  Marland Kitchen apixaban (ELIQUIS) 5 MG TABS tablet Take 1 tablet (5 mg total) by mouth 2 (two) times daily.  . Blood Glucose Monitoring Suppl (ONETOUCH VERIO REFLECT) w/Device KIT Use to test blood sugars daily as directed. DX: E11.9  . cloNIDine (CATAPRES) 0.1 MG tablet TAKE 1 TABLET (0.1 MG TOTAL) BY MOUTH 3 (THREE) TIMES DAILY.  Marland Kitchen dutasteride (AVODART) 0.5 MG capsule TAKE 1 CAPSULE BY MOUTH EVERY DAY (Patient taking differently: No sig reported)  . fluticasone (FLONASE) 50 MCG/ACT nasal spray Place 1 spray into both nostrils daily as needed for allergies or rhinitis. (Patient taking differently: Place 1 spray into both nostrils daily.)  . gabapentin (NEURONTIN) 300 MG capsule TAKE 1 CAPSULE BY MOUTH THREE TIMES A  DAY (Patient taking differently: Take 300 mg by mouth 3 (three) times daily.)  . glucose blood (ONETOUCH VERIO) test strip Use to test blood sugars daily as directed. DX: E11.9  . Iron, Ferrous Sulfate, 325 (65 Fe) MG TABS Take 325 mg by mouth 2 (two) times daily.  Marland Kitchen linaclotide (LINZESS) 145 MCG CAPS capsule Take 1 capsule (145 mcg total) by mouth daily. To regulate bowel movements  . losartan (COZAAR) 50 MG tablet Take 0.5 tablets (25 mg total) by mouth daily.  . metFORMIN (GLUCOPHAGE-XR) 500 MG 24 hr tablet Take 1 tablet (500 mg total) by mouth daily with breakfast.  . nicotine (NICODERM CQ - DOSED IN MG/24 HOURS) 14 mg/24hr patch Place 1 patch (14 mg total) onto the skin daily.  Marland Kitchen nystatin (MYCOSTATIN) 100000 UNIT/ML suspension Take 5 mLs (500,000 Units total) by mouth 4 (four) times daily.  . ondansetron (ZOFRAN-ODT) 4 MG disintegrating tablet TAKE 1 TABLET BY MOUTH EVERY 8 HOURS AS NEEDED FOR NAUSEA AND VOMITING  . OneTouch Delica Lancets 26Z MISC Use to test blood sugars daily as directed. DX: E11.9  . pravastatin (PRAVACHOL) 40 MG tablet TAKE 1 TABLET (40 MG TOTAL) BY MOUTH ON MONDAYS ,  WEDNESDAYS, AND FRIDAYS AS DIRECTED  . Semaglutide (RYBELSUS) 7 MG TABS Take 7 mg by mouth daily.  . temazepam (RESTORIL) 7.5 MG capsule Take 1 capsule (7.5 mg total) by mouth at bedtime as needed for sleep.  Marland Kitchen torsemide 40 MG TABS Take 40 mg by mouth daily.  . [DISCONTINUED] clopidogrel (PLAVIX) 75 MG tablet Take 1 tablet (75 mg total) by mouth daily at 6 (six) AM.  . [DISCONTINUED] ezetimibe (ZETIA) 10 MG tablet Take 1 tablet (10 mg total) by mouth daily.  . [DISCONTINUED] furosemide (LASIX) 20 MG tablet Take 2 tablets (40 mg total) by mouth 2 (two) times daily. Take 40 mg in AM and 20 mg in afternoon  . [DISCONTINUED] HYDROcodone-acetaminophen (NORCO) 5-325 MG tablet Take 1 tablet by mouth every 6 (six) hours as needed for moderate pain.  . [DISCONTINUED] Meth-Hyo-M Bl-Na Phos-Ph Sal (URO-MP) 118 MG CAPS TAKE 1 CAPSULE (118 MG TOTAL) BY MOUTH 2 (TWO) TIMES DAILY AS NEEDED. (Patient taking differently: Take 118 mg by mouth daily as needed (Uninary burning).)  . [DISCONTINUED] pantoprazole (PROTONIX) 40 MG tablet Take 1 tablet (40 mg total) by mouth daily.  . [DISCONTINUED] polyethylene glycol (MIRALAX) 17 g packet Take 17 g by mouth daily.     Allergies:    Jardiance [empagliflozin], Lopressor [metoprolol], Atorvastatin, Rosuvastatin, and Sulfa antibiotics   Social History: Social History   Socioeconomic History  . Marital status: Divorced    Spouse name: Not on file  . Number of children: 5  . Years of education: 10  . Highest education level: 10th grade  Occupational History  . Occupation: Retired  Tobacco Use  . Smoking status: Current Every Day Smoker    Packs/day: 0.50    Years: 55.00    Pack years: 27.50    Types: Cigarettes    Start date: 03/11/1961  . Smokeless tobacco: Never Used  . Tobacco comment: 1/2 pack to 1 pack per day 12/14/19  Vaping Use  . Vaping Use: Never used  Substance and Sexual Activity  . Alcohol use: No  . Drug use: No  . Sexual activity: Yes     Birth control/protection: None  Other Topics Concern  . Not on file  Social History Narrative  . Not on file   Social Determinants of Health  Financial Resource Strain: Not on file  Food Insecurity: Not on file  Transportation Needs: Not on file  Physical Activity: Not on file  Stress: Not on file  Social Connections: Not on file    Family History: The patient's family history includes Diabetes in his father; Heart disease in his father; Hypertension in his mother and sister; Stroke in his father.  ROS:   All other ROS reviewed and negative. Pertinent positives noted in the HPI.     EKGs/Labs/Other Studies Reviewed:   The following studies were personally reviewed by me today:  EKG:  EKG is ordered today.  The ekg ordered today demonstrates atrial flutter heart rate 107, and was personally reviewed by me.   TTE 08/21/2020  1. Left ventricular ejection fraction, by estimation, is 60 to 65%. The  left ventricle has normal function. The left ventricle has no regional  wall motion abnormalities. Left ventricular diastolic parameters are  indeterminate.  2. Right ventricular systolic function is mildly reduced. The right  ventricular size is normal.  3. The mitral valve is abnormal. No evidence of mitral valve  regurgitation. The mean mitral valve gradient is 4.0 mmHg with average  heart rate of 87 bpm. Moderate mitral annular calcification.  4. The aortic valve is calcified. Aortic valve regurgitation is not  visualized. Mild to moderate aortic valve sclerosis/calcification is  present. Aortic valve area, by VTI measures 0.91 cm. Aortic valve mean  gradient measures 8.0 mmHg. Aortic valve  Vmax measures 1.88 m/s. Aortic valve acceleration time measures 97 msec.  There is discordance between the AVA and gradients; DVI is 0.36 with LV  stroke volume index of 30; Aortic stenosis may be underestimated in this  study. Consider outpatient  monitoring if clinically indicated.    Recent Labs: 08/21/2020: Magnesium 2.0; TSH 0.628 09/05/2020: ALT 12; B Natriuretic Peptide 177.7 09/06/2020: BUN 16; Creatinine, Ser 0.96; Potassium 3.7; Sodium 135 09/10/2020: Hemoglobin 9.5; Platelets 426   Recent Lipid Panel    Component Value Date/Time   CHOL 126 08/21/2020 0530   CHOL 136 04/25/2019 1608   TRIG 89 08/21/2020 0530   HDL 28 (L) 08/21/2020 0530   HDL 28 (L) 04/25/2019 1608   CHOLHDL 4.5 08/21/2020 0530   VLDL 18 08/21/2020 0530   LDLCALC 80 08/21/2020 0530   LDLCALC 78 04/25/2019 1608    Physical Exam:   VS:  BP (!) 124/56   Pulse 91   Ht _0  (1.676 m)   Wt 160 lb 12.8 oz (72.9 kg)   SpO2 97%   BMI 25.95 kg/m    Wt Readings from Last 3 Encounters:  09/12/20 160 lb 12.8 oz (72.9 kg)  09/11/20 159 lb (72.1 kg)  09/07/20 167 lb (75.8 kg)    General: Well nourished, well developed, in no acute distress Head: Atraumatic, normal size  Eyes: PEERLA, EOMI  Neck: Supple, no JVD Endocrine: No thryomegaly Cardiac: Normal S1, S2; RRR; no murmurs, rubs, or gallops Lungs: Clear to auscultation bilaterally, no wheezing, rhonchi or rales  Abd: Soft, nontender, no hepatomegaly  Ext: 1+ pitting edema up to the knees Musculoskeletal: No deformities, BUE and BLE strength normal and equal Skin: Warm and dry, no rashes   Neuro: Alert and oriented to person, place, time, and situation, CNII-XII grossly intact, no focal deficits  Psych: Normal mood and affect   ASSESSMENT:   REDELL NAZIR is a 75 y.o. male who presents for the following: 1. Typical atrial flutter ([place])   2. Chronic  diastolic heart failure ([place])     PLAN:   1. Typical atrial flutter (HCC) -Recent diagnosis of atrial flutter after his vascular procedure.  It was unclear if he was in the so long.  He has a regular flutter rhythm. -CHADSVASC=4.  He has had interruption in anticoagulation. -He remains on Eliquis 5 mg twice daily.  He did not tolerate metoprolol.  BP is stable.  He is also developed  diastolic heart failure.  We discussed that I would like to pursue a rhythm control strategy with a cardioversion.  I would like to give him 3 weeks of anticoagulation and then pursue an elective cardioversion on October 05, 2020.  He is okay to do this.  He has missed doses of anticoagulation since our last visit in the hospital.  There was concern for possible GI bleed.  He was found to have a right inguinal hematoma.  He does not have a GI bleed.  He is safely resume anticoagulation.  We will set him up for a cardioversion in the near future. -No need for rate control.  He is rate controlled himself. -Echocardiogram recently shows normal LV function.  No evidence of mitral valve disease. -I will see him 3 weeks after his cardioversion.  2. Chronic diastolic heart failure ([place]) -He has developed diastolic heart failure.  He is not compliant with a salt restricted diet.  We will stop his furosemide and put him on torsemide 40 mg daily. -Likely will add an SGLT2 inhibitor at her next visit. -He has PAD and no evidence of CAD.  We likely will consider a stress test at her next appointment. -He cannot tolerate high intensity statin therapy.  We will likely consider him for PCSK9 inhibitor.  Disposition: Return in about 6 weeks (around 10/24/2020).  Medication Adjustments/Labs and Tests Ordered: Current medicines are reviewed at length with the patient today.  Concerns regarding medicines are outlined above.  Orders Placed This Encounter  Procedures  . Comprehensive metabolic panel  . EKG 12-Lead   Meds ordered this encounter  Medications  . torsemide 40 MG TABS    Sig: Take 40 mg by mouth daily.    Dispense:  180 tablet    Refill:  3    Patient Instructions  Medication Instructions:  Stop Lasix  Start Torsemide 40 mg daily   *If you need a refill on your cardiac medications before your next appointment, please call your pharmacy*   Lab Work: CMET today   If you have labs (blood work)  drawn today and your tests are completely normal, you will receive your results only by: Marland Kitchen MyChart Message (if you have MyChart) OR . A paper copy in the mail If you have any lab test that is abnormal or we need to change your treatment, we will call you to review the results.   Testing/Procedures: Your physician has requested that you have a Cardioversion. Electrical Cardioversion uses a jolt of electricity to your heart either through paddles or wired patches attached to your chest. This is a controlled, usually prescheduled, procedure. This procedure is done at the hospital and you are not awake during the procedure. You usually go home the day of the procedure. Please see the instruction sheet given to you today for more information.    Follow-Up: At [facility], you and your health needs are our priority.  As part of our continuing mission to provide you with exceptional heart care, we have created designated Provider Care Teams.  These  Care Teams include your primary Cardiologist (physician) and Advanced Practice Providers (APPs -  Physician Assistants and Nurse Practitioners) who all work together to provide you with the care you need, when you need it.  We recommend signing up for the patient portal called "MyChart".  Sign up information is provided on this After Visit Summary.  MyChart is used to connect with patients for Virtual Visits (Telemedicine).  Patients are able to view lab/test results, encounter notes, upcoming appointments, etc.  Non-urgent messages can be sent to your provider as well.   To learn more about what you can do with MyChart, go to [email or web address].    Your next appointment:   6 week(s)  The format for your next appointment:   In Person  Provider:   Eleonore Chiquito, MD   Other Instructions  You are scheduled for a Cardioversion on July 1st with Dr. Audie Box.  Please arrive at the [facility] (Main Entrance A) at [facility]: [place], [place] [address] at 9:00 am. (1 hour prior to procedure unless lab work is needed; if lab work is needed arrive 1.5 hours ahead)  DIET: Nothing to eat or drink after midnight except a sip of water with medications (see medication instructions below)  FYI: For your safety, and to allow [place] to monitor your vital signs accurately during the surgery/procedure we request that   if you have artificial nails, gel coating, SNS etc. Please have those removed prior to your surgery/procedure. Not having the nail coverings /polish removed may result in cancellation or delay of your surgery/procedure.   Medication Instructions:  Continue your anticoagulant: Eliquis You will need to continue your anticoagulant after your procedure until you  are told by your  Provider that it is safe to stop   Labs: CMET today   You must have a responsible person to drive you home and stay in the waiting area during your procedure. Failure to do so could result in cancellation.  Bring your insurance cards.  *Special Note: Every effort is made to have your procedure done on time. Occasionally there are emergencies that occur at the hospital that may cause delays. Please be patient if a delay does occur.       Time Spent with Patient: I have spent a total of 35 minutes with patient reviewing hospital notes, telemetry, EKGs, labs and examining the patient as well as establishing an assessment and plan that was discussed with the patient.  > 50% of time was spent in direct patient care.  Signed, Addison Naegeli. Audie Box, MD, [place]  [address], [place], [place] [address] [phone]  09/12/2020 2:33 PM

## 2020-09-09 NOTE — Progress Notes (Signed)
Cardiology Office Note:   Date:  09/12/2020  NAME:  John [name]    MRN: [id number] DOB:  September 21, 1945   PCP:  [name], FNP  Cardiologist:  None  Electrophysiologist:  None   Referring MD: [name], FNP   Chief Complaint  Patient presents with  . Atrial Flutter    History of Present Illness:   John [name] is a 75 y.o. male with a hx of atrial flutter, PAD, DM, COPD, non-small lung cell cancer with recurrence who presents for follow-up. Seen for atrial flutter after his procedure however the timing of this was unable to be determined. He was discharged with eliquis with plans for outpatient DCCV. Seen in the ER 09/05/2020 for new onset anemia and found to have R inguinal hematoma. He did have an EKG with a small gastric ulcer. AC was restarted. Had LE edema and started on lasix.   His EKG shows he is still in atrial flutter.  He denies any symptoms from this.  Heart rate 107 on EKG.  He was unable to tolerate metoprolol.  His blood pressure in the office is 124/56.  He is still having lower extremity edema.  His Eliquis was held for a few days due to his concerns for GI bleed.  He apparently was found to have a right inguinal hematoma related to his recent vascular procedure.  He is back on Eliquis and Plavix.  We discussed that I would like to pursue a cardioversion.  He is in atrial flutter.  We will set him up in roughly 3 weeks from now.  He was encouraged to miss no doses.  He is still having increased edema.  He is on Lasix 40 mg twice daily.  I would like to recheck a CMP today as well as switching to torsemide.  This will be more predictable and allow for him to have better fluid removal.  I did counsel him extensively on salt restriction.  He is eating a lot of fried foods as well as packaged foods.  I suspect he is getting a lot of salt from his diet.  He is also not active.  He also has recurrence of his small cell lung cancer.  He is going to radiation therapy  today.  It appears he had stage III disease and now with recurrence.  It appears the mainstay of his treatment is radiation.  Unclear if this is palliative or not.  His most recent lipid profile shows his LDL cholesterol is 80.  He cannot tolerate Crestor or Lipitor.  He is on pravastatin every other day.  He is also on Zetia.  We will plan to discuss a PCSK9 inhibitor with him at some point.  Problem List 1. Coronary calcifications 2. Atrial flutter  -Dx 08/21/2020 -CHADSVASC=4 (age, DM, PAD, HTN) 3. PAD -R femoral endarterectomy 08/20/2020 -R common iliac artery stent 08/20/2020 4. Non-small cell lung CA -with recurrence  5. COPD 6. Tobacco abuse 7. Gastric ulcer  -09/05/2020 8. DM  -A1c 7.5 9. HLD -T chol 126, HDL 28, LDL 80, TG 89  Past Medical History: Past Medical History:  Diagnosis Date  . Adenocarcinoma of lung, right ([place]) 04/18/2016  . Anxiety   . Arthritis   . Asthma   . BPH (benign prostatic hyperplasia)    with urinary retention 02/06/20  . CHF (congestive heart failure) ([place])   . COPD (chronic obstructive pulmonary disease) ([place])   . Depression   . Diabetes mellitus without  complication (HCC)    no meds  . Diabetes mellitus, type II ([place])   . DM (diabetes mellitus) ([place]) 07/09/2016  . Dyspnea   . History of kidney stones   . History of radiation therapy 06/25/16-08/20/16   right lung  . Hyperlipidemia   . Hypertension   . Hypertension   . Hypothyroidism   . Macular degeneration   . Neuropathy   . Non-small cell lung cancer, right ([place]) 04/18/2016  . Peripheral vascular disease ([place])   . Prostatitis   . Pulmonary nodule, left 07/16/2016  . Sleep apnea    cpap    Past Surgical History: Past Surgical History:  Procedure Laterality Date  . ABDOMINAL AORTOGRAM W/LOWER EXTREMITY Left 02/06/2020   Procedure: ABDOMINAL AORTOGRAM W/LOWER EXTREMITY;  Surgeon: Lorretta Harp, MD;  Location: [facility];  Service: Cardiovascular;  Laterality: Left;  .  CATARACT EXTRACTION, BILATERAL Bilateral   . ENDARTERECTOMY FEMORAL Right 08/20/2020   Procedure: ENDARTERECTOMY  RIGHT FEMORAL ARTERY;  Surgeon: Marty Heck, MD;  Location: [facility];  Service: Vascular;  Laterality: Right;  . ESOPHAGOGASTRODUODENOSCOPY (EGD) WITH PROPOFOL N/A 09/06/2020   Procedure: ESOPHAGOGASTRODUODENOSCOPY (EGD) WITH PROPOFOL;  Surgeon: Irene Shipper, MD;  Location: [facility];  Service: Endoscopy;  Laterality: N/A;  . INSERTION OF ILIAC STENT Right 08/20/2020   Procedure: RETROGRADE INSERTION OF RIGHT ILIAC STENT;  Surgeon: Marty Heck, MD;  Location: [facility];  Service: Vascular;  Laterality: Right;  . INTRAOPERATIVE ARTERIOGRAM Right 08/20/2020   Procedure: INTRA OPERATIVE ARTERIOGRAM ILIAC;  Surgeon: Marty Heck, MD;  Location: [facility];  Service: Vascular;  Laterality: Right;  . PATCH ANGIOPLASTY Right 08/20/2020   Procedure: PATCH ANGIOPLASTY RIGHT FEMORAL ARTERY;  Surgeon: Marty Heck, MD;  Location: [facility];  Service: Vascular;  Laterality: Right;  . PORTACATH PLACEMENT Left 06/13/2016   Procedure: INSERTION PORT-A-CATH;  Surgeon: Aviva Signs, MD;  Location: [facility];  Service: General;  Laterality: Left;  . TRANSURETHRAL RESECTION OF PROSTATE N/A 05/31/2020   Procedure: TRANSURETHRAL RESECTION OF THE PROSTATE (TURP);  Surgeon: Cleon Gustin, MD;  Location: [facility];  Service: Urology;  Laterality: N/A;  . VIDEO BRONCHOSCOPY WITH ENDOBRONCHIAL NAVIGATION N/A 05/28/2016   Procedure: VIDEO BRONCHOSCOPY WITH ENDOBRONCHIAL NAVIGATION;  Surgeon: [name], MD;  Location: [facility];  Service: Thoracic;  Laterality: N/A;  . VIDEO BRONCHOSCOPY WITH ENDOBRONCHIAL ULTRASOUND N/A 05/28/2016   Procedure: VIDEO BRONCHOSCOPY WITH ENDOBRONCHIAL ULTRASOUND;  Surgeon: [name], MD;  Location: [facility];  Service: Thoracic;  Laterality: N/A;    Current Medications: Current Meds  Medication Sig  . albuterol (VENTOLIN HFA) 108 (90 Base) MCG/ACT  inhaler Inhale 2 puffs into the lungs every 4 (four) hours as needed for wheezing or shortness of breath.  Marland Kitchen apixaban (ELIQUIS) 5 MG TABS tablet Take 1 tablet (5 mg total) by mouth 2 (two) times daily.  . Blood Glucose Monitoring Suppl (ONETOUCH VERIO REFLECT) w/Device KIT Use to test blood sugars daily as directed. DX: E11.9  . cloNIDine (CATAPRES) 0.1 MG tablet TAKE 1 TABLET (0.1 MG TOTAL) BY MOUTH 3 (THREE) TIMES DAILY.  Marland Kitchen dutasteride (AVODART) 0.5 MG capsule TAKE 1 CAPSULE BY MOUTH EVERY DAY (Patient taking differently: No sig reported)  . fluticasone (FLONASE) 50 MCG/ACT nasal spray Place 1 spray into both nostrils daily as needed for allergies or rhinitis. (Patient taking differently: Place 1 spray into both nostrils daily.)  . gabapentin (NEURONTIN) 300 MG capsule TAKE 1 CAPSULE BY MOUTH THREE TIMES A  DAY (Patient taking differently: Take 300 mg by mouth 3 (three) times daily.)  . glucose blood (ONETOUCH VERIO) test strip Use to test blood sugars daily as directed. DX: E11.9  . Iron, Ferrous Sulfate, 325 (65 Fe) MG TABS Take 325 mg by mouth 2 (two) times daily.  Marland Kitchen linaclotide (LINZESS) 145 MCG CAPS capsule Take 1 capsule (145 mcg total) by mouth daily. To regulate bowel movements  . losartan (COZAAR) 50 MG tablet Take 0.5 tablets (25 mg total) by mouth daily.  . metFORMIN (GLUCOPHAGE-XR) 500 MG 24 hr tablet Take 1 tablet (500 mg total) by mouth daily with breakfast.  . nicotine (NICODERM CQ - DOSED IN MG/24 HOURS) 14 mg/24hr patch Place 1 patch (14 mg total) onto the skin daily.  Marland Kitchen nystatin (MYCOSTATIN) 100000 UNIT/ML suspension Take 5 mLs (500,000 Units total) by mouth 4 (four) times daily.  . ondansetron (ZOFRAN-ODT) 4 MG disintegrating tablet TAKE 1 TABLET BY MOUTH EVERY 8 HOURS AS NEEDED FOR NAUSEA AND VOMITING  . OneTouch Delica Lancets 26Z MISC Use to test blood sugars daily as directed. DX: E11.9  . pravastatin (PRAVACHOL) 40 MG tablet TAKE 1 TABLET (40 MG TOTAL) BY MOUTH ON MONDAYS ,  WEDNESDAYS, AND FRIDAYS AS DIRECTED  . Semaglutide (RYBELSUS) 7 MG TABS Take 7 mg by mouth daily.  . temazepam (RESTORIL) 7.5 MG capsule Take 1 capsule (7.5 mg total) by mouth at bedtime as needed for sleep.  Marland Kitchen torsemide 40 MG TABS Take 40 mg by mouth daily.  . [DISCONTINUED] clopidogrel (PLAVIX) 75 MG tablet Take 1 tablet (75 mg total) by mouth daily at 6 (six) AM.  . [DISCONTINUED] ezetimibe (ZETIA) 10 MG tablet Take 1 tablet (10 mg total) by mouth daily.  . [DISCONTINUED] furosemide (LASIX) 20 MG tablet Take 2 tablets (40 mg total) by mouth 2 (two) times daily. Take 40 mg in AM and 20 mg in afternoon  . [DISCONTINUED] HYDROcodone-acetaminophen (NORCO) 5-325 MG tablet Take 1 tablet by mouth every 6 (six) hours as needed for moderate pain.  . [DISCONTINUED] Meth-Hyo-M Bl-Na Phos-Ph Sal (URO-MP) 118 MG CAPS TAKE 1 CAPSULE (118 MG TOTAL) BY MOUTH 2 (TWO) TIMES DAILY AS NEEDED. (Patient taking differently: Take 118 mg by mouth daily as needed (Uninary burning).)  . [DISCONTINUED] pantoprazole (PROTONIX) 40 MG tablet Take 1 tablet (40 mg total) by mouth daily.  . [DISCONTINUED] polyethylene glycol (MIRALAX) 17 g packet Take 17 g by mouth daily.     Allergies:    Jardiance [empagliflozin], Lopressor [metoprolol], Atorvastatin, Rosuvastatin, and Sulfa antibiotics   Social History: Social History   Socioeconomic History  . Marital status: Divorced    Spouse name: Not on file  . Number of children: 5  . Years of education: 10  . Highest education level: 10th grade  Occupational History  . Occupation: Retired  Tobacco Use  . Smoking status: Current Every Day Smoker    Packs/day: 0.50    Years: 55.00    Pack years: 27.50    Types: Cigarettes    Start date: 03/11/1961  . Smokeless tobacco: Never Used  . Tobacco comment: 1/2 pack to 1 pack per day 12/14/19  Vaping Use  . Vaping Use: Never used  Substance and Sexual Activity  . Alcohol use: No  . Drug use: No  . Sexual activity: Yes     Birth control/protection: None  Other Topics Concern  . Not on file  Social History Narrative  . Not on file   Social Determinants of Health  Financial Resource Strain: Not on file  Food Insecurity: Not on file  Transportation Needs: Not on file  Physical Activity: Not on file  Stress: Not on file  Social Connections: Not on file    Family History: The patient's family history includes Diabetes in his father; Heart disease in his father; Hypertension in his mother and sister; Stroke in his father.  ROS:   All other ROS reviewed and negative. Pertinent positives noted in the HPI.     EKGs/Labs/Other Studies Reviewed:   The following studies were personally reviewed by me today:  EKG:  EKG is ordered today.  The ekg ordered today demonstrates atrial flutter heart rate 107, and was personally reviewed by me.   TTE 08/21/2020  1. Left ventricular ejection fraction, by estimation, is 60 to 65%. The  left ventricle has normal function. The left ventricle has no regional  wall motion abnormalities. Left ventricular diastolic parameters are  indeterminate.  2. Right ventricular systolic function is mildly reduced. The right  ventricular size is normal.  3. The mitral valve is abnormal. No evidence of mitral valve  regurgitation. The mean mitral valve gradient is 4.0 mmHg with average  heart rate of 87 bpm. Moderate mitral annular calcification.  4. The aortic valve is calcified. Aortic valve regurgitation is not  visualized. Mild to moderate aortic valve sclerosis/calcification is  present. Aortic valve area, by VTI measures 0.91 cm. Aortic valve mean  gradient measures 8.0 mmHg. Aortic valve  Vmax measures 1.88 m/s. Aortic valve acceleration time measures 97 msec.  There is discordance between the AVA and gradients; DVI is 0.36 with LV  stroke volume index of 30; Aortic stenosis may be underestimated in this  study. Consider outpatient  monitoring if clinically indicated.    Recent Labs: 08/21/2020: Magnesium 2.0; TSH 0.628 09/05/2020: ALT 12; B Natriuretic Peptide 177.7 09/06/2020: BUN 16; Creatinine, Ser 0.96; Potassium 3.7; Sodium 135 09/10/2020: Hemoglobin 9.5; Platelets 426   Recent Lipid Panel    Component Value Date/Time   CHOL 126 08/21/2020 0530   CHOL 136 04/25/2019 1608   TRIG 89 08/21/2020 0530   HDL 28 (L) 08/21/2020 0530   HDL 28 (L) 04/25/2019 1608   CHOLHDL 4.5 08/21/2020 0530   VLDL 18 08/21/2020 0530   LDLCALC 80 08/21/2020 0530   LDLCALC 78 04/25/2019 1608    Physical Exam:   VS:  BP (!) 124/56   Pulse 91   Ht _0  (1.676 m)   Wt 160 lb 12.8 oz (72.9 kg)   SpO2 97%   BMI 25.95 kg/m    Wt Readings from Last 3 Encounters:  09/12/20 160 lb 12.8 oz (72.9 kg)  09/11/20 159 lb (72.1 kg)  09/07/20 167 lb (75.8 kg)    General: Well nourished, well developed, in no acute distress Head: Atraumatic, normal size  Eyes: PEERLA, EOMI  Neck: Supple, no JVD Endocrine: No thryomegaly Cardiac: Normal S1, S2; RRR; no murmurs, rubs, or gallops Lungs: Clear to auscultation bilaterally, no wheezing, rhonchi or rales  Abd: Soft, nontender, no hepatomegaly  Ext: 1+ pitting edema up to the knees Musculoskeletal: No deformities, BUE and BLE strength normal and equal Skin: Warm and dry, no rashes   Neuro: Alert and oriented to person, place, time, and situation, CNII-XII grossly intact, no focal deficits  Psych: Normal mood and affect   ASSESSMENT:   John [name] is a 75 y.o. male who presents for the following: 1. Typical atrial flutter ([place])   2. Chronic  diastolic heart failure ([place])     PLAN:   1. Typical atrial flutter (HCC) -Recent diagnosis of atrial flutter after his vascular procedure.  It was unclear if he was in the so long.  He has a regular flutter rhythm. -CHADSVASC=4.  He has had interruption in anticoagulation. -He remains on Eliquis 5 mg twice daily.  He did not tolerate metoprolol.  BP is stable.  He is also developed  diastolic heart failure.  We discussed that I would like to pursue a rhythm control strategy with a cardioversion.  I would like to give him 3 weeks of anticoagulation and then pursue an elective cardioversion on October 05, 2020.  He is okay to do this.  He has missed doses of anticoagulation since our last visit in the hospital.  There was concern for possible GI bleed.  He was found to have a right inguinal hematoma.  He does not have a GI bleed.  He is safely resume anticoagulation.  We will set him up for a cardioversion in the near future. -No need for rate control.  He is rate controlled himself. -Echocardiogram recently shows normal LV function.  No evidence of mitral valve disease. -I will see him 3 weeks after his cardioversion.  2. Chronic diastolic heart failure ([place]) -He has developed diastolic heart failure.  He is not compliant with a salt restricted diet.  We will stop his furosemide and put him on torsemide 40 mg daily. -Likely will add an SGLT2 inhibitor at her next visit. -He has PAD and no evidence of CAD.  We likely will consider a stress test at her next appointment. -He cannot tolerate high intensity statin therapy.  We will likely consider him for PCSK9 inhibitor.  Disposition: Return in about 6 weeks (around 10/24/2020).  Medication Adjustments/Labs and Tests Ordered: Current medicines are reviewed at length with the patient today.  Concerns regarding medicines are outlined above.  Orders Placed This Encounter  Procedures  . Comprehensive metabolic panel  . EKG 12-Lead   Meds ordered this encounter  Medications  . torsemide 40 MG TABS    Sig: Take 40 mg by mouth daily.    Dispense:  180 tablet    Refill:  3    Patient Instructions  Medication Instructions:  Stop Lasix  Start Torsemide 40 mg daily   *If you need a refill on your cardiac medications before your next appointment, please call your pharmacy*   Lab Work: CMET today   If you have labs (blood work)  drawn today and your tests are completely normal, you will receive your results only by: Marland Kitchen MyChart Message (if you have MyChart) OR . A paper copy in the mail If you have any lab test that is abnormal or we need to change your treatment, we will call you to review the results.   Testing/Procedures: Your physician has requested that you have a Cardioversion. Electrical Cardioversion uses a jolt of electricity to your heart either through paddles or wired patches attached to your chest. This is a controlled, usually prescheduled, procedure. This procedure is done at the hospital and you are not awake during the procedure. You usually go home the day of the procedure. Please see the instruction sheet given to you today for more information.    Follow-Up: At [facility], you and your health needs are our priority.  As part of our continuing mission to provide you with exceptional heart care, we have created designated Provider Care Teams.  These  Care Teams include your primary Cardiologist (physician) and Advanced Practice Providers (APPs -  Physician Assistants and Nurse Practitioners) who all work together to provide you with the care you need, when you need it.  We recommend signing up for the patient portal called "MyChart".  Sign up information is provided on this After Visit Summary.  MyChart is used to connect with patients for Virtual Visits (Telemedicine).  Patients are able to view lab/test results, encounter notes, upcoming appointments, etc.  Non-urgent messages can be sent to your provider as well.   To learn more about what you can do with MyChart, go to [email or web address].    Your next appointment:   6 week(s)  The format for your next appointment:   In Person  Provider:   Eleonore Chiquito, MD   Other Instructions  You are scheduled for a Cardioversion on July 1st with Dr. Audie Box.  Please arrive at the [facility] (Main Entrance A) at [facility]: [place], [place] [address] at 9:00 am. (1 hour prior to procedure unless lab work is needed; if lab work is needed arrive 1.5 hours ahead)  DIET: Nothing to eat or drink after midnight except a sip of water with medications (see medication instructions below)  FYI: For your safety, and to allow [place] to monitor your vital signs accurately during the surgery/procedure we request that   if you have artificial nails, gel coating, SNS etc. Please have those removed prior to your surgery/procedure. Not having the nail coverings /polish removed may result in cancellation or delay of your surgery/procedure.   Medication Instructions:  Continue your anticoagulant: Eliquis You will need to continue your anticoagulant after your procedure until you  are told by your  Provider that it is safe to stop   Labs: CMET today   You must have a responsible person to drive you home and stay in the waiting area during your procedure. Failure to do so could result in cancellation.  Bring your insurance cards.  *Special Note: Every effort is made to have your procedure done on time. Occasionally there are emergencies that occur at the hospital that may cause delays. Please be patient if a delay does occur.       Time Spent with Patient: I have spent a total of 35 minutes with patient reviewing hospital notes, telemetry, EKGs, labs and examining the patient as well as establishing an assessment and plan that was discussed with the patient.  > 50% of time was spent in direct patient care.  Signed, Addison Naegeli. Audie Box, MD, [place]  [address], [place], [place] [address] [phone]  09/12/2020 2:33 PM

## 2020-09-10 ENCOUNTER — Telehealth: Payer: Self-pay | Admitting: *Deleted

## 2020-09-10 ENCOUNTER — Encounter (HOSPITAL_COMMUNITY): Payer: Self-pay | Admitting: Internal Medicine

## 2020-09-10 ENCOUNTER — Ambulatory Visit (INDEPENDENT_AMBULATORY_CARE_PROVIDER_SITE_OTHER): Payer: Medicare Other | Admitting: *Deleted

## 2020-09-10 ENCOUNTER — Other Ambulatory Visit: Payer: Self-pay

## 2020-09-10 ENCOUNTER — Other Ambulatory Visit (HOSPITAL_COMMUNITY)
Admission: RE | Admit: 2020-09-10 | Discharge: 2020-09-10 | Disposition: A | Discharge status: Home / Self Care | Payer: Medicare Other | Source: Other Acute Inpatient Hospital | Attending: Family | Admitting: Family

## 2020-09-10 ENCOUNTER — Ambulatory Visit
Admission: RE | Admit: 2020-09-10 | Discharge: 2020-09-10 | Disposition: A | Discharge status: Home / Self Care | Payer: Medicare Other | Source: Ambulatory Visit | Attending: Radiation Oncology | Admitting: Radiation Oncology

## 2020-09-10 DIAGNOSIS — C3491 Malignant neoplasm of unspecified part of right bronchus or lung: Secondary | ICD-10-CM | POA: Diagnosis not present

## 2020-09-10 DIAGNOSIS — I10 Essential (primary) hypertension: Secondary | ICD-10-CM | POA: Insufficient documentation

## 2020-09-10 DIAGNOSIS — Z95820 Peripheral vascular angioplasty status with implants and grafts: Secondary | ICD-10-CM | POA: Diagnosis not present

## 2020-09-10 DIAGNOSIS — I110 Hypertensive heart disease with heart failure: Secondary | ICD-10-CM | POA: Diagnosis not present

## 2020-09-10 DIAGNOSIS — I4892 Unspecified atrial flutter: Secondary | ICD-10-CM | POA: Diagnosis not present

## 2020-09-10 DIAGNOSIS — Z48812 Encounter for surgical aftercare following surgery on the circulatory system: Secondary | ICD-10-CM | POA: Diagnosis not present

## 2020-09-10 DIAGNOSIS — J449 Chronic obstructive pulmonary disease, unspecified: Secondary | ICD-10-CM | POA: Diagnosis not present

## 2020-09-10 DIAGNOSIS — E1142 Type 2 diabetes mellitus with diabetic polyneuropathy: Secondary | ICD-10-CM | POA: Insufficient documentation

## 2020-09-10 DIAGNOSIS — E1151 Type 2 diabetes mellitus with diabetic peripheral angiopathy without gangrene: Secondary | ICD-10-CM | POA: Diagnosis not present

## 2020-09-10 DIAGNOSIS — I509 Heart failure, unspecified: Secondary | ICD-10-CM | POA: Diagnosis not present

## 2020-09-10 LAB — CBC
HCT: 32.3 % — ABNORMAL LOW (ref 39.0–52.0)
Hemoglobin: 9.5 g/dL — ABNORMAL LOW (ref 13.0–17.0)
MCH: 25.7 pg — ABNORMAL LOW (ref 26.0–34.0)
MCHC: 29.4 g/dL — ABNORMAL LOW (ref 30.0–36.0)
MCV: 87.5 fL (ref 80.0–100.0)
Platelets: 426 10*3/uL — ABNORMAL HIGH (ref 150–400)
RBC: 3.69 MIL/uL — ABNORMAL LOW (ref 4.22–5.81)
RDW: 20.1 % — ABNORMAL HIGH (ref 11.5–15.5)
WBC: 11.3 10*3/uL — ABNORMAL HIGH (ref 4.0–10.5)
nRBC: 0.4 % — ABNORMAL HIGH (ref 0.0–0.2)

## 2020-09-10 MED ORDER — NYSTATIN 100000 UNIT/ML MT SUSP: 5.0000 mL | Freq: Four times a day (QID) | OROMUCOSAL | 1 refills | Status: DC

## 2020-09-10 NOTE — Telephone Encounter (Signed)
Patient aware.

## 2020-09-10 NOTE — Telephone Encounter (Signed)
[place] nurse there today - pt is pale in color and weak - would like stat CBC - verbal OK given   Also would like a swish and spit nystatin order and send to [facility]

## 2020-09-10 NOTE — Chronic Care Management (AMB) (Signed)
Chronic Care Management   CCM RN Visit Note  09/10/2020 Name: John [name] MRN: [id number] DOB: 12/01/1945  Subjective: ITAMAR [name] is a 75 y.o. year old male who is a primary care patient of [name], FNP. The care management team was consulted for assistance with disease management and care coordination needs.    Engaged with patient by telephone for initial visit in response to provider referral for case management and/or care coordination services.   Consent to Services:  The patient was given information about Chronic Care Management services, agreed to services, and gave verbal consent prior to initiation of services.  Please see initial visit note for detailed documentation.   Patient agreed to services and verbal consent obtained.   Assessment: Review of patient past medical history, allergies, medications, health status, including review of consultants reports, laboratory and other test data, was performed as part of comprehensive evaluation and provision of chronic care management services.   SDOH (Social Determinants of Health) assessments and interventions performed:    CCM Care Plan  Allergies  Allergen Reactions  . Jardiance [Empagliflozin] Other (See Comments)    FEELS SLUGGISH, TIRED  . Lopressor [Metoprolol] Other (See Comments)    Fatigue  . Atorvastatin     Muscle aches - tolerating Pravastatin 40 mg MWF  . Rosuvastatin     Muscle aches - tolerating Pravastatin 40 mg MWF  . Sulfa Antibiotics Swelling    Mouth swelling    Outpatient Encounter Medications as of 09/10/2020  Medication Sig  . albuterol (VENTOLIN HFA) 108 (90 Base) MCG/ACT inhaler Inhale 2 puffs into the lungs every 4 (four) hours as needed for wheezing or shortness of breath.  Marland Kitchen apixaban (ELIQUIS) 5 MG TABS tablet Take 1 tablet (5 mg total) by mouth 2 (two) times daily.  . Blood Glucose Monitoring Suppl (ONETOUCH VERIO REFLECT) w/Device KIT Use to test blood sugars daily as  directed. DX: E11.9  . cloNIDine (CATAPRES) 0.1 MG tablet TAKE 1 TABLET (0.1 MG TOTAL) BY MOUTH 3 (THREE) TIMES DAILY.  Marland Kitchen clopidogrel (PLAVIX) 75 MG tablet Take 1 tablet (75 mg total) by mouth daily at 6 (six) AM.  . dutasteride (AVODART) 0.5 MG capsule TAKE 1 CAPSULE BY MOUTH EVERY DAY (Patient taking differently: Take 0.5 mg by mouth daily.)  . ezetimibe (ZETIA) 10 MG tablet Take 1 tablet (10 mg total) by mouth daily.  . fluticasone (FLONASE) 50 MCG/ACT nasal spray Place 1 spray into both nostrils daily as needed for allergies or rhinitis. (Patient taking differently: Place 1 spray into both nostrils daily.)  . furosemide (LASIX) 20 MG tablet Take 2 tablets (40 mg total) by mouth 2 (two) times daily. Take 40 mg in AM and 20 mg in afternoon  . gabapentin (NEURONTIN) 300 MG capsule TAKE 1 CAPSULE BY MOUTH THREE TIMES A DAY (Patient taking differently: Take 300 mg by mouth 3 (three) times daily.)  . glucose blood (ONETOUCH VERIO) test strip Use to test blood sugars daily as directed. DX: E11.9  . HYDROcodone-acetaminophen (NORCO) 5-325 MG tablet Take 1 tablet by mouth every 6 (six) hours as needed for moderate pain.  . Iron, Ferrous Sulfate, 325 (65 Fe) MG TABS Take 325 mg by mouth 2 (two) times daily.  Marland Kitchen levothyroxine (SYNTHROID) 50 MCG tablet Take 1 tablet (50 mcg total) by mouth daily.  Marland Kitchen linaclotide (LINZESS) 145 MCG CAPS capsule Take 1 capsule (145 mcg total) by mouth daily. To regulate bowel movements  . losartan (COZAAR) 50 MG tablet  Take 0.5 tablets (25 mg total) by mouth daily.  . metFORMIN (GLUCOPHAGE-XR) 500 MG 24 hr tablet Take 1 tablet (500 mg total) by mouth daily with breakfast.  . Meth-Hyo-M Bl-Na Phos-Ph Sal (URO-MP) 118 MG CAPS TAKE 1 CAPSULE (118 MG TOTAL) BY MOUTH 2 (TWO) TIMES DAILY AS NEEDED. (Patient taking differently: Take 118 mg by mouth daily as needed (Uninary burning).)  . nicotine (NICODERM CQ - DOSED IN MG/24 HOURS) 14 mg/24hr patch Place 1 patch (14 mg total) onto the  skin daily.  Marland Kitchen nystatin (MYCOSTATIN) 100000 UNIT/ML suspension Take 5 mLs (500,000 Units total) by mouth 4 (four) times daily.  . ondansetron (ZOFRAN-ODT) 4 MG disintegrating tablet TAKE 1 TABLET BY MOUTH EVERY 8 HOURS AS NEEDED FOR NAUSEA AND VOMITING (Patient taking differently: Take 4 mg by mouth every 8 (eight) hours as needed for vomiting or nausea.)  . OneTouch Delica Lancets 82N MISC Use to test blood sugars daily as directed. DX: E11.9  . pantoprazole (PROTONIX) 40 MG tablet Take 1 tablet (40 mg total) by mouth daily.  . polyethylene glycol (MIRALAX) 17 g packet Take 17 g by mouth daily.  . pravastatin (PRAVACHOL) 40 MG tablet TAKE 1 TABLET (40 MG TOTAL) BY MOUTH ON MONDAYS , WEDNESDAYS, AND FRIDAYS AS DIRECTED (Patient taking differently: Take 40 mg by mouth See admin instructions. Take one tablet by  mouth on Monday, Wednesday and Fridays)  . Semaglutide (RYBELSUS) 7 MG TABS Take 7 mg by mouth daily.  . temazepam (RESTORIL) 7.5 MG capsule Take 1 capsule (7.5 mg total) by mouth at bedtime as needed for sleep.   No facility-administered encounter medications on file as of 09/10/2020.    Patient Active Problem List   Diagnosis Date Noted  . Constipation 09/07/2020  . Acute gastric ulcer without hemorrhage or perforation   . GI bleed 09/05/2020  . PAF (paroxysmal atrial fibrillation) ([place]) 09/05/2020  . Symptomatic anemia 09/05/2020  . Peripheral arterial disease ([place]) 08/20/2020  . Dysuria 07/27/2020  . Sepsis due to Escherichia coli (E. coli) ([place]) 05/07/2020  . Hypokalemia 05/06/2020  . Hyperglycemia due to diabetes mellitus ([place]) 05/06/2020  . Hypoalbuminemia 05/06/2020  . Prolonged QT interval 05/06/2020  . Lactic acidosis 05/06/2020  . Bronchitis due to tobacco use 05/06/2020  . Acute respiratory failure with hypoxia ([place]) 05/06/2020  . Acute on chronic respiratory failure with hypoxia ([place]) 05/06/2020  . COPD with acute exacerbation ([place]) 05/06/2020  . Tobacco abuse  05/06/2020  . Urinary retention 03/09/2020  . Acute cystitis with hematuria   . Urinary tract infection with hematuria   . Dyspnea   . Hypoxia   . Sepsis secondary to UTI ([place]) 03/03/2020  . COPD GOLD III/ group D still smoking  10/24/2019  . PAD (peripheral artery disease) ([place]) 09/13/2019  . Obstructive sleep apnea treated with continuous positive airway pressure (CPAP) 05/31/2018  . Diabetic neuropathy ([place]) 12/29/2017  . Cigarette smoker 12/29/2017  . Hyperlipidemia associated with type 2 diabetes mellitus ([place]) 12/29/2017  . Primary insomnia 12/29/2017  . Primary osteoarthritis involving multiple joints 12/29/2017  . OSA (obstructive sleep apnea) 12/10/2017  . Hypothyroidism 06/09/2017  . Joint pain 08/05/2016  . Bilateral hand swelling 08/05/2016  . Hyponatremia 08/05/2016  . Pulmonary nodule, left 07/16/2016  . CHF (congestive heart failure) ([place]) 07/09/2016  . DM (diabetes mellitus) ([place]) 07/09/2016  . Non-small cell lung cancer, right ([place]) 04/18/2016  . Essential hypertension 02/01/2016  . COPD (chronic obstructive pulmonary disease) ([place]) 02/01/2016  . BPH with urinary  obstruction 02/01/2016  . Chest pain, unspecified 10/26/2008    Conditions to be addressed/monitored:Atrial Fibrillation, CHF, HTN, HLD, DMII, Anxiety, Depression, Pulmonary Disease and hypothyroidism and osteoarthritis  Care Plan : RNCM: General Plan of Care (Adult)  Updates made by Ilean China, RN since 09/10/2020 12:00 AM    Problem: Health Promotion or Disease Self-Management (General Plan of Care)     Long-Range Goal: Self-Management Plan Developed   Start Date: 09/10/2020  This Visit's Progress: Not on track  Priority: High  Note:   Current Barriers:  . Chronic Disease Management support and education needs related to HTN, CHF, PAF, PAD, right non-small cell lung cancer, COPD, DM, HLD, hypothyroidism, OA, anxiety, depression  Nurse Case Manager Clinical Goal(s):  . patient will work with  PCP and specialty providers to address needs related to medical management of chronic medical conditions . patient will meet with RN Care Manager to address self-management of chronic medical conditions . the patient will demonstrate ongoing self health care management ability as evidenced by continuing to attend all scheduled medical appointments and by reaching out to providers as needed with any questions or concerns*  Interventions:  . 1:1 collaboration with [name], FNP regarding development and update of comprehensive plan of care as evidenced by provider attestation and co-signature . Inter-disciplinary care team collaboration (see longitudinal plan of care) . Chart reviewed including relevant office notes, correspondence notes, lab results, and imaging reports . Reviewed medications . Reviewed upcoming appointments . Discussed cancer treatment that is scheduled for today . Rescheduled full Initial CCM visit for 09/25/20 per patient's request due to time constraints today . Encouraged patient to reach out to PCP or CCM team as needed . Discussed plans with patient for ongoing care management follow up and provided patient with direct contact information for care management team  Self Care Activities:  . Self administers medications as prescribed . Attends all scheduled provider appointments . Calls pharmacy for medication refills . Performs ADL's independently . Calls provider office for new concerns or questions  Patient Goals Over the next 15 days, patient will: . Take medication as prescribed . Call PCP or specialist with any questions or concerns . Keep all medical appointments . Keep Initial telephone visit with RNCM on 09/25/20 . Call RN Care Manager as needed [phone]    Follow Up Plan:  . Telephone follow up appointment with care management team member scheduled for: 09/25/20 with RNCM . The patient has been provided with contact information for the care  management team and has been advised to call with any health related questions or concerns.   [name], BSN, RN-BC Embedded Chronic Care Manager [facility] [place] [facility] Direct Dial: [phone]

## 2020-09-10 NOTE — Patient Instructions (Signed)
Visit Information   PATIENT GOALS:  Goals Addressed            This Visit's Progress   . Disease Self Management   Not on track    Timeframe:  Long-Range Goal Priority:  High Start Date:   09/10/20                          Expected End Date:  09/10/21                      Follow-up: 09/25/20  . Take medication as prescribed . Call PCP or specialist with any questions or concerns . Keep all medical appointments . Keep Initial telephone visit with RNCM on 09/25/20 . Call RN Care Manager as needed [phone]       Consent to CCM Services: Mr. Furtick was given information about Chronic Care Management services today including:  1. CCM service includes personalized support from designated clinical staff supervised by his physician, including individualized plan of care and coordination with other care providers 2. 24/7 contact phone numbers for assistance for urgent and routine care needs. 3. Service will only be billed when office clinical staff spend 20 minutes or more in a month to coordinate care. 4. Only one practitioner may furnish and bill the service in a calendar month. 5. The patient may stop CCM services at any time (effective at the end of the month) by phone call to the office staff. 6. The patient will be responsible for cost sharing (co-pay) of up to 20% of the service fee (after annual deductible is met).  Patient agreed to services and verbal consent obtained.   Patient verbalizes understanding of instructions provided today and agrees to view in [place].     Follow Up Plan:  . Telephone follow up appointment with care management team member scheduled for: 09/25/20 with RNCM . The patient has been provided with contact information for the care management team and has been advised to call with any health related questions or concerns.   [name], BSN, RN-BC Embedded Chronic Care Manager [facility] [place] [facility] Direct Dial:  [phone]  CLINICAL CARE PLAN: Patient Care Plan: RNCM: General Plan of Care (Adult)    Problem Identified: Health Promotion or Disease Self-Management (General Plan of Care)     Long-Range Goal: Self-Management Plan Developed   Start Date: 09/10/2020  This Visit's Progress: Not on track  Priority: High  Note:   Current Barriers:  . Chronic Disease Management support and education needs related to HTN, CHF, PAF, PAD, right non-small cell lung cancer, COPD, DM, HLD, hypothyroidism, OA, anxiety, depression  Nurse Case Manager Clinical Goal(s):  . patient will work with PCP and specialty providers to address needs related to medical management of chronic medical conditions . patient will meet with RN Care Manager to address self-management of chronic medical conditions . the patient will demonstrate ongoing self health care management ability as evidenced by continuing to attend all scheduled medical appointments and by reaching out to providers as needed with any questions or concerns*  Interventions:  . 1:1 collaboration with [name], FNP regarding development and update of comprehensive plan of care as evidenced by provider attestation and co-signature . Inter-disciplinary care team collaboration (see longitudinal plan of care) . Chart reviewed including relevant office notes, correspondence notes, lab results, and imaging reports . Reviewed medications . Reviewed upcoming appointments . Discussed cancer  treatment that is scheduled for today . Rescheduled full Initial CCM visit for 09/25/20 per patient's request due to time constraints today . Encouraged patient to reach out to PCP or CCM team as needed . Discussed plans with patient for ongoing care management follow up and provided patient with direct contact information for care management team  Self Care Activities:  . Self administers medications as prescribed . Attends all scheduled provider appointments . Calls  pharmacy for medication refills . Performs ADL's independently . Calls provider office for new concerns or questions  Patient Goals Over the next 15 days, patient will: . Take medication as prescribed . Call PCP or specialist with any questions or concerns . Keep all medical appointments . Keep Initial telephone visit with RNCM on 09/25/20 . Call RN Care Manager as needed [phone]  Follow Up Plan:  . Telephone follow up appointment with care management team member scheduled for: 09/25/20 with RNCM . The patient has been provided with contact information for the care management team and has been advised to call with any health related questions or concerns.

## 2020-09-10 NOTE — Telephone Encounter (Signed)
Yes, please get CBC. Nystatin Prescription sent to pharmacy.

## 2020-09-11 ENCOUNTER — Ambulatory Visit (INDEPENDENT_AMBULATORY_CARE_PROVIDER_SITE_OTHER): Payer: Medicare Other | Admitting: Vascular Surgery

## 2020-09-11 ENCOUNTER — Telehealth: Payer: Self-pay | Admitting: Cardiovascular Disease

## 2020-09-11 ENCOUNTER — Encounter: Payer: Self-pay | Admitting: Vascular Surgery

## 2020-09-11 VITALS — BP 97/64 | HR 90 | Temp 98.2°F | Resp 16 | Ht 66.0 in | Wt 159.0 lb

## 2020-09-11 DIAGNOSIS — I739 Peripheral vascular disease, unspecified: Secondary | ICD-10-CM

## 2020-09-11 NOTE — Progress Notes (Signed)
Patient returned call.  States he cannot take antibiotic (caused mouth sores) b/p dropped as well 97/64 today.  Is not having symptoms of UTI as of now?  What should he do?  Please call.

## 2020-09-11 NOTE — Telephone Encounter (Signed)
    Pt c/o medication issue:  1. Name of Medication: metoprolol    2. How are you currently taking this medication (dosage and times per day)?   3. Are you having a reaction (difficulty breathing--STAT)?   4. What is your medication issue? Pt said he was seen by [facility] doctor and was advised to call Dr. Audie Box to decrease his metoprolol because his BP is low. Today its at 97/54

## 2020-09-11 NOTE — Telephone Encounter (Signed)
Spoke to patient he stated he just saw [facility] Dr.B/P 97/54.Dr.advised him to stop taking Metoprolol.Advised Metoprolol not listed on medication list.Stated he has been taking Metoprolol 50 mg daily.Advised to hold Metoprolol and keep appointment already scheduled with Dr.O'Neal tomorrow 6/8 at 2:00 pm.Advised to bring a list of all medications.I will make Dr.O'Neal aware.

## 2020-09-11 NOTE — Progress Notes (Signed)
 Patient name: John [name] MRN: [id number] DOB: 08/18/1945 Sex: male  REASON FOR VISIT: Groin check  HPI: Lucio D Kleeman is a 75 y.o. male with multiple medical comorbidities that presents for right groin check.  He is well-known and recently underwent right common femoral endarterectomy with profundoplasty and bovine patch angioplasty with a right common iliac stent on Aug 20, 2020 for short distance lifestyle limiting claudication.  He was seen last week as a triage add on with concern for purulent drainage from the right groin.  After evaluation in the office, I did not feel that this was infected and recommended wound care.  States today home health nurse feels this is healing.  Past Medical History:  Diagnosis Date  . Adenocarcinoma of lung, right (HCC) 04/18/2016  . Anxiety   . Arthritis   . Asthma   . BPH (benign prostatic hyperplasia)    with urinary retention 02/06/20  . CHF (congestive heart failure) (HCC)   . COPD (chronic obstructive pulmonary disease) (HCC)   . Depression   . Diabetes mellitus without complication (HCC)    no meds  . Diabetes mellitus, type II (HCC)   . DM (diabetes mellitus) (HCC) 07/09/2016  . Dyspnea   . History of kidney stones   . History of radiation therapy 06/25/16-08/20/16   right lung  . Hyperlipidemia   . Hypertension   . Hypertension   . Hypothyroidism   . Macular degeneration   . Neuropathy   . Non-small cell lung cancer, right (HCC) 04/18/2016  . Peripheral vascular disease (HCC)   . Prostatitis   . Pulmonary nodule, left 07/16/2016  . Sleep apnea    cpap    Past Surgical History:  Procedure Laterality Date  . ABDOMINAL AORTOGRAM W/LOWER EXTREMITY Left 02/06/2020   Procedure: ABDOMINAL AORTOGRAM W/LOWER EXTREMITY;  Surgeon: Berry, Jonathan J, MD;  Location: [facility];  Service: Cardiovascular;  Laterality: Left;  . CATARACT EXTRACTION, BILATERAL Bilateral   . ENDARTERECTOMY FEMORAL Right 08/20/2020   Procedure:  ENDARTERECTOMY  RIGHT FEMORAL ARTERY;  Surgeon: Clark, Christopher J, MD;  Location: [facility] OR;  Service: Vascular;  Laterality: Right;  . ESOPHAGOGASTRODUODENOSCOPY (EGD) WITH PROPOFOL N/A 09/06/2020   Procedure: ESOPHAGOGASTRODUODENOSCOPY (EGD) WITH PROPOFOL;  Surgeon: Perry, John N, MD;  Location: [facility];  Service: Endoscopy;  Laterality: N/A;  . INSERTION OF ILIAC STENT Right 08/20/2020   Procedure: RETROGRADE INSERTION OF RIGHT ILIAC STENT;  Surgeon: Clark, Christopher J, MD;  Location: [facility];  Service: Vascular;  Laterality: Right;  . INTRAOPERATIVE ARTERIOGRAM Right 08/20/2020   Procedure: INTRA OPERATIVE ARTERIOGRAM ILIAC;  Surgeon: Clark, Christopher J, MD;  Location: [facility];  Service: Vascular;  Laterality: Right;  . PATCH ANGIOPLASTY Right 08/20/2020   Procedure: PATCH ANGIOPLASTY RIGHT FEMORAL ARTERY;  Surgeon: Clark, Christopher J, MD;  Location: [facility];  Service: Vascular;  Laterality: Right;  . PORTACATH PLACEMENT Left 06/13/2016   Procedure: INSERTION PORT-A-CATH;  Surgeon: Mark Jenkins, MD;  Location: [facility];  Service: General;  Laterality: Left;  . TRANSURETHRAL RESECTION OF PROSTATE N/A 05/31/2020   Procedure: TRANSURETHRAL RESECTION OF THE PROSTATE (TURP);  Surgeon: McKenzie, Patrick L, MD;  Location: [facility];  Service: Urology;  Laterality: N/A;  . VIDEO BRONCHOSCOPY WITH ENDOBRONCHIAL NAVIGATION N/A 05/28/2016   Procedure: VIDEO BRONCHOSCOPY WITH ENDOBRONCHIAL NAVIGATION;  Surgeon: Steven C Hendrickson, MD;  Location: [facility];  Service: Thoracic;  Laterality: N/A;  . VIDEO BRONCHOSCOPY WITH ENDOBRONCHIAL ULTRASOUND N/A 05/28/2016   Procedure: VIDEO BRONCHOSCOPY WITH ENDOBRONCHIAL   ULTRASOUND;  Surgeon: [name], MD;  Location: [facility];  Service: Thoracic;  Laterality: N/A;    Family History  Problem Relation Age of Onset  . Hypertension Mother   . Diabetes Father   . Heart disease Father   . Stroke Father   . Hypertension Sister     SOCIAL HISTORY: Social History    Tobacco Use  . Smoking status: Current Every Day Smoker    Packs/day: 0.50    Years: 55.00    Pack years: 27.50    Types: Cigarettes    Start date: 03/11/1961  . Smokeless tobacco: Never Used  . Tobacco comment: 1/2 pack to 1 pack per day 12/14/19  Substance Use Topics  . Alcohol use: No    Allergies  Allergen Reactions  . Jardiance [Empagliflozin] Other (See Comments)    FEELS SLUGGISH, TIRED  . Lopressor [Metoprolol] Other (See Comments)    Fatigue  . Atorvastatin     Muscle aches - tolerating Pravastatin 40 mg MWF  . Rosuvastatin     Muscle aches - tolerating Pravastatin 40 mg MWF  . Sulfa Antibiotics Swelling    Mouth swelling    Current Outpatient Medications  Medication Sig Dispense Refill  . albuterol (VENTOLIN HFA) 108 (90 Base) MCG/ACT inhaler Inhale 2 puffs into the lungs every 4 (four) hours as needed for wheezing or shortness of breath. 8 g 6  . apixaban (ELIQUIS) 5 MG TABS tablet Take 1 tablet (5 mg total) by mouth 2 (two) times daily. 60 tablet 3  . Blood Glucose Monitoring Suppl (ONETOUCH VERIO REFLECT) w/Device KIT Use to test blood sugars daily as directed. DX: E11.9 1 kit 1  . cloNIDine (CATAPRES) 0.1 MG tablet TAKE 1 TABLET (0.1 MG TOTAL) BY MOUTH 3 (THREE) TIMES DAILY. 270 tablet 0  . clopidogrel (PLAVIX) 75 MG tablet Take 1 tablet (75 mg total) by mouth daily at 6 (six) AM. 30 tablet 11  . dutasteride (AVODART) 0.5 MG capsule TAKE 1 CAPSULE BY MOUTH EVERY DAY (Patient taking differently: Take 0.5 mg by mouth daily.) 90 capsule 1  . ezetimibe (ZETIA) 10 MG tablet Take 1 tablet (10 mg total) by mouth daily. 30 tablet 1  . fluticasone (FLONASE) 50 MCG/ACT nasal spray Place 1 spray into both nostrils daily as needed for allergies or rhinitis. (Patient taking differently: Place 1 spray into both nostrils daily.) 18.2 mL 2  . furosemide (LASIX) 20 MG tablet Take 2 tablets (40 mg total) by mouth 2 (two) times daily. Take 40 mg in AM and 20 mg in afternoon 180  tablet 2  . gabapentin (NEURONTIN) 300 MG capsule TAKE 1 CAPSULE BY MOUTH THREE TIMES A DAY (Patient taking differently: Take 300 mg by mouth 3 (three) times daily.) 270 capsule 0  . glucose blood (ONETOUCH VERIO) test strip Use to test blood sugars daily as directed. DX: E11.9 100 each 12  . HYDROcodone-acetaminophen (NORCO) 5-325 MG tablet Take 1 tablet by mouth every 6 (six) hours as needed for moderate pain. 20 tablet 0  . Iron, Ferrous Sulfate, 325 (65 Fe) MG TABS Take 325 mg by mouth 2 (two) times daily. 60 tablet 0  . linaclotide (LINZESS) 145 MCG CAPS capsule Take 1 capsule (145 mcg total) by mouth daily. To regulate bowel movements 30 capsule 5  . losartan (COZAAR) 50 MG tablet Take 0.5 tablets (25 mg total) by mouth daily. 45 tablet 3  . metFORMIN (GLUCOPHAGE-XR) 500 MG 24 hr tablet Take 1 tablet (500 mg  total) by mouth daily with breakfast. 90 tablet 1  . Meth-Hyo-M Bl-Na Phos-Ph Sal (URO-MP) 118 MG CAPS TAKE 1 CAPSULE (118 MG TOTAL) BY MOUTH 2 (TWO) TIMES DAILY AS NEEDED. (Patient taking differently: Take 118 mg by mouth daily as needed (Uninary burning).) 30 capsule 3  . nicotine (NICODERM CQ - DOSED IN MG/24 HOURS) 14 mg/24hr patch Place 1 patch (14 mg total) onto the skin daily. 28 patch 0  . nystatin (MYCOSTATIN) 100000 UNIT/ML suspension Take 5 mLs (500,000 Units total) by mouth 4 (four) times daily. 473 mL 1  . ondansetron (ZOFRAN-ODT) 4 MG disintegrating tablet TAKE 1 TABLET BY MOUTH EVERY 8 HOURS AS NEEDED FOR NAUSEA AND VOMITING (Patient taking differently: Take 4 mg by mouth every 8 (eight) hours as needed for vomiting or nausea.) 20 tablet 0  . OneTouch Delica Lancets 30G MISC Use to test blood sugars daily as directed. DX: E11.9 100 each 1  . pantoprazole (PROTONIX) 40 MG tablet Take 1 tablet (40 mg total) by mouth daily. 30 tablet 1  . polyethylene glycol (MIRALAX) 17 g packet Take 17 g by mouth daily. 14 each 0  . pravastatin (PRAVACHOL) 40 MG tablet TAKE 1 TABLET (40 MG  TOTAL) BY MOUTH ON MONDAYS , WEDNESDAYS, AND FRIDAYS AS DIRECTED (Patient taking differently: Take 40 mg by mouth See admin instructions. Take one tablet by  mouth on Monday, Wednesday and Fridays) 48 tablet 3  . Semaglutide (RYBELSUS) 7 MG TABS Take 7 mg by mouth daily. 90 tablet 1  . temazepam (RESTORIL) 7.5 MG capsule Take 1 capsule (7.5 mg total) by mouth at bedtime as needed for sleep. 30 capsule 1  . levothyroxine (SYNTHROID) 50 MCG tablet Take 1 tablet (50 mcg total) by mouth daily. 30 tablet 2   No current facility-administered medications for this visit.    REVIEW OF SYSTEMS:  [X] denotes positive finding, [ ] denotes negative finding Cardiac  Comments:  Chest pain or chest pressure:    Shortness of breath upon exertion:    Short of breath when lying flat:    Irregular heart rhythm:        Vascular    Pain in calf, thigh, or hip brought on by ambulation:    Pain in feet at night that wakes you up from your sleep:     Blood clot in your veins:    Leg swelling:  x Right      Pulmonary    Oxygen at home:    Productive cough:     Wheezing:         Neurologic    Sudden weakness in arms or legs:     Sudden numbness in arms or legs:     Sudden onset of difficulty speaking or slurred speech:    Temporary loss of vision in one eye:     Problems with dizziness:         Gastrointestinal    Blood in stool:     Vomited blood:         Genitourinary    Burning when urinating:     Blood in urine:        Psychiatric    Major depression:         Hematologic    Bleeding problems:    Problems with blood clotting too easily:        Skin    Rashes or ulcers:        Constitutional    Fever or chills:        PHYSICAL EXAM: Vitals:   09/11/20 1433  BP: 97/64  Pulse: 90  Resp: 16  Temp: 98.2 F (36.8 C)  TempSrc: Temporal  SpO2: 96%  Weight: 159 lb (72.1 kg)  Height: 5' 6" (1.676 m)    GENERAL: The patient is a well-nourished male, in no acute distress. The vital  signs are documented above. CARDIAC: There is a regular rate and rhythm.  VASCULAR:  Right groin incision healing, no drainage, no signs of infection Right DP brisk by Doppler  DATA:   None  Assessment/Plan:  74-year-old male status post right common femoral endarterectomy with profundoplasty and bovine pericardial patch angioplasty and right common iliac stent on 08/20/2020 for short distance lifestyle limiting claudication.  On additional follow-up today I feel his right groin incision is making good progress.  I see no signs of infection.  The Betadine is irritating his incision so discussed just washing this with Dial soap and keeping a clean dry dressing on it.  I will have him follow-up again in 2 to 3 weeks for one final wound check in the [facility] clinic.   Christopher J. Clark, MD [facility] Office: [phone]      

## 2020-09-12 ENCOUNTER — Other Ambulatory Visit: Payer: Self-pay

## 2020-09-12 ENCOUNTER — Ambulatory Visit
Admission: RE | Admit: 2020-09-12 | Discharge: 2020-09-12 | Disposition: A | Discharge status: Home / Self Care | Payer: Medicare Other | Source: Ambulatory Visit | Attending: Radiation Oncology | Admitting: Radiation Oncology

## 2020-09-12 ENCOUNTER — Ambulatory Visit (INDEPENDENT_AMBULATORY_CARE_PROVIDER_SITE_OTHER): Payer: Medicare Other | Admitting: Cardiovascular Disease

## 2020-09-12 ENCOUNTER — Encounter: Payer: Self-pay | Admitting: Cardiovascular Disease

## 2020-09-12 ENCOUNTER — Ambulatory Visit: Payer: Medicare Other | Admitting: Radiation Oncology

## 2020-09-12 VITALS — BP 124/56 | HR 91 | Ht 66.0 in | Wt 160.8 lb

## 2020-09-12 DIAGNOSIS — C3491 Malignant neoplasm of unspecified part of right bronchus or lung: Secondary | ICD-10-CM

## 2020-09-12 DIAGNOSIS — I5032 Chronic diastolic (congestive) heart failure: Secondary | ICD-10-CM | POA: Diagnosis not present

## 2020-09-12 DIAGNOSIS — I483 Typical atrial flutter: Secondary | ICD-10-CM | POA: Diagnosis not present

## 2020-09-12 MED ORDER — TORSEMIDE 40 MG PO TABS: 40.0000 mg | ORAL_TABLET | Freq: Every day | ORAL | 3 refills | Status: DC

## 2020-09-12 NOTE — Patient Instructions (Signed)
Medication Instructions:  Stop Lasix  Start Torsemide 40 mg daily   *If you need a refill on your cardiac medications before your next appointment, please call your pharmacy*   Lab Work: CMET today   If you have labs (blood work) drawn today and your tests are completely normal, you will receive your results only by: Marland Kitchen MyChart Message (if you have MyChart) OR . A paper copy in the mail If you have any lab test that is abnormal or we need to change your treatment, we will call you to review the results.   Testing/Procedures: Your physician has requested that you have a Cardioversion. Electrical Cardioversion uses a jolt of electricity to your heart either through paddles or wired patches attached to your chest. This is a controlled, usually prescheduled, procedure. This procedure is done at the hospital and you are not awake during the procedure. You usually go home the day of the procedure. Please see the instruction sheet given to you today for more information.    Follow-Up: At [facility], you and your health needs are our priority.  As part of our continuing mission to provide you with exceptional heart care, we have created designated Provider Care Teams.  These Care Teams include your primary Cardiologist (physician) and Advanced Practice Providers (APPs -  Physician Assistants and Nurse Practitioners) who all work together to provide you with the care you need, when you need it.  We recommend signing up for the patient portal called "MyChart".  Sign up information is provided on this After Visit Summary.  MyChart is used to connect with patients for Virtual Visits (Telemedicine).  Patients are able to view lab/test results, encounter notes, upcoming appointments, etc.  Non-urgent messages can be sent to your provider as well.   To learn more about what you can do with MyChart, go to [email or web address].    Your next appointment:   6 week(s)  The format for your next  appointment:   In Person  Provider:   Eleonore Chiquito, MD   Other Instructions  You are scheduled for a Cardioversion on July 1st with Dr. Audie Box.  Please arrive at the [facility] (Main Entrance A) at [facility]: [place], [place] [address] at 9:00 am. (1 hour prior to procedure unless lab work is needed; if lab work is needed arrive 1.5 hours ahead)  DIET: Nothing to eat or drink after midnight except a sip of water with medications (see medication instructions below)  FYI: For your safety, and to allow [place] to monitor your vital signs accurately during the surgery/procedure we request that   if you have artificial nails, gel coating, SNS etc. Please have those removed prior to your surgery/procedure. Not having the nail coverings /polish removed may result in cancellation or delay of your surgery/procedure.   Medication Instructions:  Continue your anticoagulant: Eliquis You will need to continue your anticoagulant after your procedure until you  are told by your  Provider that it is safe to stop   Labs: CMET today   You must have a responsible person to drive you home and stay in the waiting area during your procedure. Failure to do so could result in cancellation.  Bring your insurance cards.  *Special Note: Every effort is made to have your procedure done on time. Occasionally there are emergencies that occur at the hospital that may cause delays. Please be patient if a delay does occur.

## 2020-09-13 ENCOUNTER — Ambulatory Visit (INDEPENDENT_AMBULATORY_CARE_PROVIDER_SITE_OTHER): Payer: Medicare Other | Admitting: Family

## 2020-09-13 ENCOUNTER — Ambulatory Visit (INDEPENDENT_AMBULATORY_CARE_PROVIDER_SITE_OTHER): Payer: Medicare Other

## 2020-09-13 ENCOUNTER — Encounter: Payer: Self-pay | Admitting: Family

## 2020-09-13 VITALS — BP 144/60 | HR 88 | Temp 97.2°F | Ht 66.0 in | Wt 159.8 lb

## 2020-09-13 DIAGNOSIS — I110 Hypertensive heart disease with heart failure: Secondary | ICD-10-CM | POA: Diagnosis not present

## 2020-09-13 DIAGNOSIS — D72829 Elevated white blood cell count, unspecified: Secondary | ICD-10-CM | POA: Diagnosis not present

## 2020-09-13 DIAGNOSIS — R399 Unspecified symptoms and signs involving the genitourinary system: Secondary | ICD-10-CM

## 2020-09-13 DIAGNOSIS — E1142 Type 2 diabetes mellitus with diabetic polyneuropathy: Secondary | ICD-10-CM | POA: Diagnosis not present

## 2020-09-13 DIAGNOSIS — I10 Essential (primary) hypertension: Secondary | ICD-10-CM | POA: Diagnosis not present

## 2020-09-13 DIAGNOSIS — C3491 Malignant neoplasm of unspecified part of right bronchus or lung: Secondary | ICD-10-CM

## 2020-09-13 DIAGNOSIS — E1169 Type 2 diabetes mellitus with other specified complication: Secondary | ICD-10-CM | POA: Diagnosis not present

## 2020-09-13 DIAGNOSIS — I509 Heart failure, unspecified: Secondary | ICD-10-CM

## 2020-09-13 DIAGNOSIS — E1151 Type 2 diabetes mellitus with diabetic peripheral angiopathy without gangrene: Secondary | ICD-10-CM | POA: Diagnosis not present

## 2020-09-13 DIAGNOSIS — Z95820 Peripheral vascular angioplasty status with implants and grafts: Secondary | ICD-10-CM | POA: Diagnosis not present

## 2020-09-13 DIAGNOSIS — E785 Hyperlipidemia, unspecified: Secondary | ICD-10-CM | POA: Diagnosis not present

## 2020-09-13 DIAGNOSIS — I4892 Unspecified atrial flutter: Secondary | ICD-10-CM | POA: Diagnosis not present

## 2020-09-13 DIAGNOSIS — E039 Hypothyroidism, unspecified: Secondary | ICD-10-CM

## 2020-09-13 DIAGNOSIS — J449 Chronic obstructive pulmonary disease, unspecified: Secondary | ICD-10-CM | POA: Diagnosis not present

## 2020-09-13 DIAGNOSIS — C3490 Malignant neoplasm of unspecified part of unspecified bronchus or lung: Secondary | ICD-10-CM | POA: Diagnosis not present

## 2020-09-13 DIAGNOSIS — Z48812 Encounter for surgical aftercare following surgery on the circulatory system: Secondary | ICD-10-CM | POA: Diagnosis not present

## 2020-09-13 DIAGNOSIS — Z720 Tobacco use: Secondary | ICD-10-CM | POA: Diagnosis not present

## 2020-09-13 DIAGNOSIS — J439 Emphysema, unspecified: Secondary | ICD-10-CM | POA: Diagnosis not present

## 2020-09-13 DIAGNOSIS — I2510 Atherosclerotic heart disease of native coronary artery without angina pectoris: Secondary | ICD-10-CM | POA: Diagnosis not present

## 2020-09-13 LAB — URINALYSIS, COMPLETE
Bilirubin, UA: NEGATIVE
Glucose, UA: NEGATIVE
Nitrite, UA: NEGATIVE
Specific Gravity, UA: 1.02 (ref 1.005–1.030)
Urobilinogen, Ur: 0.2 mg/dL (ref 0.2–1.0)
pH, UA: 5.5 (ref 5.0–7.5)

## 2020-09-13 LAB — MICROSCOPIC EXAMINATION: Epithelial Cells (non renal): NONE SEEN /hpf (ref 0–10)

## 2020-09-13 NOTE — Patient Instructions (Signed)
Urinary Tract Infection, Adult  A urinary tract infection (UTI) is an infection of any part of the urinary tract. The urinary tract includes the kidneys, ureters, bladder, and urethra. These organs make, store, and get rid of urine in the body. An upper UTI affects the ureters and kidneys. A lower UTI affects the bladder and urethra. What are the causes? Most urinary tract infections are caused by bacteria in your genital area around your urethra, where urine leaves your body. These bacteria grow and cause inflammation of your urinary tract. What increases the risk? You are more likely to develop this condition if:  You have a urinary catheter that stays in place.  You are not able to control when you urinate or have a bowel movement (incontinence).  You are male and you: ? Use a spermicide or diaphragm for birth control. ? Have low estrogen levels. ? Are pregnant.  You have certain genes that increase your risk.  You are sexually active.  You take antibiotic medicines.  You have a condition that causes your flow of urine to slow down, such as: ? An enlarged prostate, if you are male. ? Blockage in your urethra. ? A kidney stone. ? A nerve condition that affects your bladder control (neurogenic bladder). ? Not getting enough to drink, or not urinating often.  You have certain medical conditions, such as: ? Diabetes. ? A weak disease-fighting system (immunesystem). ? Sickle cell disease. ? Gout. ? Spinal cord injury. What are the signs or symptoms? Symptoms of this condition include:  Needing to urinate right away (urgency).  Frequent urination. This may include small amounts of urine each time you urinate.  Pain or burning with urination.  Blood in the urine.  Urine that smells bad or unusual.  Trouble urinating.  Cloudy urine.  Vaginal discharge, if you are male.  Pain in the abdomen or the lower back. You may also have:  Vomiting or a decreased  appetite.  Confusion.  Irritability or tiredness.  A fever or chills.  Diarrhea. The first symptom in older adults may be confusion. In some cases, they may not have any symptoms until the infection has worsened. How is this diagnosed? This condition is diagnosed based on your medical history and a physical exam. You may also have other tests, including:  Urine tests.  Blood tests.  Tests for STIs (sexually transmitted infections). If you have had more than one UTI, a cystoscopy or imaging studies may be done to determine the cause of the infections. How is this treated? Treatment for this condition includes:  Antibiotic medicine.  Over-the-counter medicines to treat discomfort.  Drinking enough water to stay hydrated. If you have frequent infections or have other conditions such as a kidney stone, you may need to see a health care provider who specializes in the urinary tract (urologist). In rare cases, urinary tract infections can cause sepsis. Sepsis is a life-threatening condition that occurs when the body responds to an infection. Sepsis is treated in the hospital with IV antibiotics, fluids, and other medicines. Follow these instructions at home: Medicines  Take over-the-counter and prescription medicines only as told by your health care provider.  If you were prescribed an antibiotic medicine, take it as told by your health care provider. Do not stop using the antibiotic even if you start to feel better. General instructions  Make sure you: ? Empty your bladder often and completely. Do not hold urine for long periods of time. ? Empty your bladder after   sex. ? Wipe from front to back after urinating or having a bowel movement if you are male. Use each tissue only one time when you wipe.  Drink enough fluid to keep your urine pale yellow.  Keep all follow-up visits. This is important.   Contact a health care provider if:  Your symptoms do not get better after 1-2  days.  Your symptoms go away and then return. Get help right away if:  You have severe pain in your back or your lower abdomen.  You have a fever or chills.  You have nausea or vomiting. Summary  A urinary tract infection (UTI) is an infection of any part of the urinary tract, which includes the kidneys, ureters, bladder, and urethra.  Most urinary tract infections are caused by bacteria in your genital area.  Treatment for this condition often includes antibiotic medicines.  If you were prescribed an antibiotic medicine, take it as told by your health care provider. Do not stop using the antibiotic even if you start to feel better.  Keep all follow-up visits. This is important. This information is not intended to replace advice given to you by your health care provider. Make sure you discuss any questions you have with your health care provider. Document Revised: 11/04/2019 Document Reviewed: 11/04/2019 Elsevier Patient Education  2021 Elsevier Inc.  

## 2020-09-13 NOTE — Progress Notes (Signed)
Subjective:    Patient ID: John [name], male    DOB: 1945/11/15, 75 y.o.   MRN: [id number]  Chief Complaint  Patient presents with   Medical Management of Chronic Issues    Pt presents to the office today for  chronic care follow up. He is followed by Cardiologists  for CHF. Followed by Oncologists every 6 months for non-small lung cancer. He is currently getting radiation.  He is followed by Pulmonologist every 6 months for COPD. He is followed by Vascular for PAD.    He is followed by Urologists for BPH and Prostate.  He is scheduled for a cardoversion for 10/05/20. His Cardiologists stopped his lasix 67m BID to torsemide 40 mg daily.  Hypertension This is a chronic problem. The current episode started more than 1 year ago. The problem has been waxing and waning since onset. The problem is controlled. Associated symptoms include malaise/fatigue, peripheral edema and shortness of breath. Risk factors for coronary artery disease include obesity, male gender, sedentary lifestyle and dyslipidemia. The current treatment provides moderate improvement. Hypertensive end-organ damage includes kidney disease and heart failure. Identifiable causes of hypertension include a thyroid problem.  Congestive Heart Failure Presents for follow-up visit. Associated symptoms include edema, fatigue, orthopnea and shortness of breath. The symptoms have been improving.  Thyroid Problem Presents for follow-up visit. Symptoms include dry skin and fatigue. The symptoms have been stable. His past medical history is significant for heart failure and hyperlipidemia.  Hyperlipidemia This is a chronic problem. The current episode started more than 1 year ago. The problem is controlled. Exacerbating diseases include obesity. Associated symptoms include shortness of breath. Current antihyperlipidemic treatment includes statins. The current treatment provides moderate improvement of lipids. Risk factors for coronary  artery disease include dyslipidemia, male sex, hypertension, a sedentary lifestyle and diabetes mellitus.  Arthritis Presents for follow-up visit. He complains of pain and stiffness. The symptoms have been stable. Affected locations include the left knee, right knee, right MCP and left MCP. Associated symptoms include fatigue.     Review of Systems  Constitutional:  Positive for fatigue and malaise/fatigue.  Respiratory:  Positive for shortness of breath.   Musculoskeletal:  Positive for arthritis and stiffness.  All other systems reviewed and are negative.     Objective:   Physical Exam Vitals reviewed.  Constitutional:      General: He is not in acute distress.    Appearance: He is well-developed.  HENT:     Head: Normocephalic.     Right Ear: Tympanic membrane normal.     Left Ear: Tympanic membrane normal.  Eyes:     General:        Right eye: No discharge.        Left eye: No discharge.     Pupils: Pupils are equal, round, and reactive to light.  Neck:     Thyroid: No thyromegaly.  Cardiovascular:     Rate and Rhythm: Normal rate and regular rhythm.     Heart sounds: Normal heart sounds. No murmur heard. Pulmonary:     Effort: Pulmonary effort is normal. No respiratory distress.     Breath sounds: Rhonchi present. No wheezing.  Abdominal:     General: Bowel sounds are normal. There is no distension.     Palpations: Abdomen is soft.     Tenderness: There is no abdominal tenderness.  Musculoskeletal:        General: No tenderness. Normal range of motion.     Cervical  back: Normal range of motion and neck supple.     Right lower leg: Edema (3+) present.     Left lower leg: Edema (3+) present.  Skin:    General: Skin is warm and dry.     Coloration: Skin is pale.     Findings: No erythema or rash.  Neurological:     Mental Status: He is alert and oriented to person, place, and time.     Cranial Nerves: No cranial nerve deficit.     Motor: Weakness present.      Deep Tendon Reflexes: Reflexes are normal and symmetric.  Psychiatric:        Behavior: Behavior normal.        Thought Content: Thought content normal.        Judgment: Judgment normal.      BP (!) 144/60   Pulse 88   Temp (!) 97.2 F (36.2 C) (Temporal)   Ht _0  (1.676 m)   Wt 159 lb 12.8 oz (72.5 kg)   SpO2 99%   BMI 25.79 kg/m      Assessment & Plan:  John [name] comes in today with chief complaint of Medical Management of Chronic Issues   Diagnosis and orders addressed:  1. Leukocytosis, unspecified type - Urinalysis, Complete - CBC with Differential/Platelet - BMP8+EGFR - Urine Culture - DG Chest 2 View; Future  2. Essential hypertension - CBC with Differential/Platelet - BMP8+EGFR  3. Congestive heart failure, unspecified HF chronicity, unspecified heart failure type ([place]) - CBC with Differential/Platelet - BMP8+EGFR  4. Non-small cell lung cancer, right (HCC) - CBC with Differential/Platelet - BMP8+EGFR  5. Hypothyroidism, unspecified type  - CBC with Differential/Platelet - BMP8+EGFR  6. Hyperlipidemia associated with type 2 diabetes mellitus (HCC) - CBC with Differential/Platelet - BMP8+EGFR  7. Type 2 diabetes mellitus with diabetic polyneuropathy, without long-term current use of insulin (HCC) - CBC with Differential/Platelet - BMP8+EGFR  8. Tobacco abuse - CBC with Differential/Platelet - BMP8+EGFR  9. UTI symptoms - Urine Culture - DG Chest 2 View; Future   Labs pending Health Maintenance reviewed Diet and exercise encouraged  Follow up plan: 1 months   Evelina Dun, FNP

## 2020-09-14 ENCOUNTER — Other Ambulatory Visit: Payer: Self-pay | Admitting: Family

## 2020-09-14 LAB — BMP8+EGFR
BUN/Creatinine Ratio: 16 (ref 10–24)
BUN: 12 mg/dL (ref 8–27)
CO2: 29 mmol/L (ref 20–29)
Calcium: 8.6 mg/dL (ref 8.6–10.2)
Chloride: 99 mmol/L (ref 96–106)
Creatinine, Ser: 0.76 mg/dL (ref 0.76–1.27)
Glucose: 149 mg/dL — ABNORMAL HIGH (ref 65–99)
Potassium: 3.2 mmol/L — ABNORMAL LOW (ref 3.5–5.2)
Sodium: 143 mmol/L (ref 134–144)
eGFR: 94 mL/min/{1.73_m2} (ref >59)

## 2020-09-14 LAB — CBC WITH DIFFERENTIAL/PLATELET
Basophils Absolute: 0 10*3/uL (ref 0.0–0.2)
Basos: 0 %
EOS (ABSOLUTE): 0.2 10*3/uL (ref 0.0–0.4)
Eos: 2 %
Hematocrit: 32.8 % — ABNORMAL LOW (ref 37.5–51.0)
Hemoglobin: 9.7 g/dL — ABNORMAL LOW (ref 13.0–17.7)
Immature Grans (Abs): 0.1 10*3/uL (ref 0.0–0.1)
Immature Granulocytes: 1 %
Lymphocytes Absolute: 0.8 10*3/uL (ref 0.7–3.1)
Lymphs: 6 %
MCH: 25.2 pg — ABNORMAL LOW (ref 26.6–33.0)
MCHC: 29.6 g/dL — ABNORMAL LOW (ref 31.5–35.7)
MCV: 85 fL (ref 79–97)
Monocytes Absolute: 1.3 10*3/uL — ABNORMAL HIGH (ref 0.1–0.9)
Monocytes: 11 %
Neutrophils Absolute: 9.8 10*3/uL — ABNORMAL HIGH (ref 1.4–7.0)
Neutrophils: 80 %
Platelets: 334 10*3/uL (ref 150–450)
RBC: 3.85 x10E6/uL — ABNORMAL LOW (ref 4.14–5.80)
RDW: 19.6 % — ABNORMAL HIGH (ref 11.6–15.4)
WBC: 12.2 10*3/uL — ABNORMAL HIGH (ref 3.4–10.8)

## 2020-09-14 MED ORDER — AMOXICILLIN-POT CLAVULANATE 875-125 MG PO TABS: 1.0000 | ORAL_TABLET | Freq: Two times a day (BID) | ORAL | 0 refills | Status: DC

## 2020-09-14 MED ORDER — POTASSIUM CHLORIDE CRYS ER 20 MEQ PO TBCR: 20.0000 meq | EXTENDED_RELEASE_TABLET | Freq: Every day | ORAL | 0 refills | Status: DC

## 2020-09-16 LAB — URINE CULTURE

## 2020-09-17 ENCOUNTER — Other Ambulatory Visit: Payer: Self-pay

## 2020-09-17 ENCOUNTER — Ambulatory Visit
Admission: RE | Admit: 2020-09-17 | Discharge: 2020-09-17 | Disposition: A | Discharge status: Home / Self Care | Payer: Medicare Other | Source: Ambulatory Visit | Attending: Radiation Oncology | Admitting: Radiation Oncology

## 2020-09-17 ENCOUNTER — Encounter: Payer: Self-pay | Admitting: Radiation Oncology

## 2020-09-17 DIAGNOSIS — C3411 Malignant neoplasm of upper lobe, right bronchus or lung: Secondary | ICD-10-CM | POA: Diagnosis not present

## 2020-09-17 DIAGNOSIS — Z87891 Personal history of nicotine dependence: Secondary | ICD-10-CM | POA: Diagnosis not present

## 2020-09-17 DIAGNOSIS — C3491 Malignant neoplasm of unspecified part of right bronchus or lung: Secondary | ICD-10-CM

## 2020-09-17 DIAGNOSIS — Z923 Personal history of irradiation: Secondary | ICD-10-CM

## 2020-09-17 DIAGNOSIS — I483 Typical atrial flutter: Secondary | ICD-10-CM | POA: Diagnosis not present

## 2020-09-17 DIAGNOSIS — C3412 Malignant neoplasm of upper lobe, left bronchus or lung: Secondary | ICD-10-CM | POA: Diagnosis not present

## 2020-09-17 HISTORY — DX: Personal history of irradiation: Z92.3

## 2020-09-18 ENCOUNTER — Ambulatory Visit (INDEPENDENT_AMBULATORY_CARE_PROVIDER_SITE_OTHER): Payer: Medicare Other

## 2020-09-18 VITALS — Ht 66.0 in | Wt 156.0 lb

## 2020-09-18 DIAGNOSIS — Z0000 Encounter for general adult medical examination without abnormal findings: Secondary | ICD-10-CM

## 2020-09-18 LAB — COMPREHENSIVE METABOLIC PANEL
ALT: 11 IU/L (ref 0–44)
AST: 12 IU/L (ref 0–40)
Albumin/Globulin Ratio: 1.4 (ref 1.2–2.2)
Albumin: 3.7 g/dL (ref 3.7–4.7)
Alkaline Phosphatase: 123 IU/L — ABNORMAL HIGH (ref 44–121)
BUN/Creatinine Ratio: 17 (ref 10–24)
BUN: 15 mg/dL (ref 8–27)
Bilirubin Total: 0.3 mg/dL (ref 0.0–1.2)
CO2: 27 mmol/L (ref 20–29)
Calcium: 8.9 mg/dL (ref 8.6–10.2)
Chloride: 98 mmol/L (ref 96–106)
Creatinine, Ser: 0.9 mg/dL (ref 0.76–1.27)
Globulin, Total: 2.7 g/dL (ref 1.5–4.5)
Glucose: 168 mg/dL — ABNORMAL HIGH (ref 65–99)
Potassium: 3.5 mmol/L (ref 3.5–5.2)
Sodium: 141 mmol/L (ref 134–144)
Total Protein: 6.4 g/dL (ref 6.0–8.5)
eGFR: 90 mL/min/{1.73_m2} (ref >59)

## 2020-09-18 NOTE — Patient Instructions (Signed)
John [name] , Thank you for taking time to come for your Medicare Wellness Visit. I appreciate your ongoing commitment to your health goals. Please review the following plan we discussed and let me know if I can assist you in the future.   Screening recommendations/referrals: Colonoscopy: Cologuard done 01/29/2018 - Repeat every 3 years (this October) Recommended yearly ophthalmology/optometry visit for glaucoma screening and checkup Recommended yearly dental visit for hygiene and checkup  Vaccinations: Influenza vaccine: Done 02/01/2020 - Repeat annually Pneumococcal vaccine: Done 06/17/2017 & 04/25/2019 Tdap vaccine: Done 05/17/2019 - Repeat in 10 years Shingles vaccine: Zostavax done 07/10/2014; due for Shingrix   Covid-19: Done 05/10/19, 06/07/19, & 01/31/2020  Advanced directives: Please bring a copy of your health care power of attorney and living will to the office to be added to your chart at your convenience.  Conditions/risks identified: Aim for 30 minutes of exercise or brisk walking each day, drink 6-8 glasses of water and eat lots of fruits and vegetables.  Next appointment: Follow up in one year for your annual wellness visit.   Preventive Care 75 Years and Older, Male  Preventive care refers to lifestyle choices and visits with your health care provider that can promote health and wellness. What does preventive care include? A yearly physical exam. This is also called an annual well check. Dental exams once or twice a year. Routine eye exams. Ask your health care provider how often you should have your eyes checked. Personal lifestyle choices, including: Daily care of your teeth and gums. Regular physical activity. Eating a healthy diet. Avoiding tobacco and drug use. Limiting alcohol use. Practicing safe sex. Taking low doses of aspirin every day. Taking vitamin and mineral supplements as recommended by your health care provider. What happens during an annual well  check? The services and screenings done by your health care provider during your annual well check will depend on your age, overall health, lifestyle risk factors, and family history of disease. Counseling  Your health care provider may ask you questions about your: Alcohol use. Tobacco use. Drug use. Emotional well-being. Home and relationship well-being. Sexual activity. Eating habits. History of falls. Memory and ability to understand (cognition). Work and work Statistician. Screening  You may have the following tests or measurements: Height, weight, and BMI. Blood pressure. Lipid and cholesterol levels. These may be checked every 5 years, or more frequently if you are over 30 years old. Skin check. Lung cancer screening. You may have this screening every year starting at age 75 if you have a 30-pack-year history of smoking and currently smoke or have quit within the past 15 years. Fecal occult blood test (FOBT) of the stool. You may have this test every year starting at age 75. Flexible sigmoidoscopy or colonoscopy. You may have a sigmoidoscopy every 5 years or a colonoscopy every 10 years starting at age 70. Prostate cancer screening. Recommendations will vary depending on your family history and other risks. Hepatitis C blood test. Hepatitis B blood test. Sexually transmitted disease (STD) testing. Diabetes screening. This is done by checking your blood sugar (glucose) after you have not eaten for a while (fasting). You may have this done every 1-3 years. Abdominal aortic aneurysm (AAA) screening. You may need this if you are a current or former smoker. Osteoporosis. You may be screened starting at age 62 if you are at high risk. Talk with your health care provider about your test results, treatment options, and if necessary, the need for more tests. Vaccines  Your health care provider may recommend certain vaccines, such as: Influenza vaccine. This is recommended every  year. Tetanus, diphtheria, and acellular pertussis (Tdap, Td) vaccine. You may need a Td booster every 10 years. Zoster vaccine. You may need this after age 56. Pneumococcal 13-valent conjugate (PCV13) vaccine. One dose is recommended after age 75. Pneumococcal polysaccharide (PPSV23) vaccine. One dose is recommended after age 62. Talk to your health care provider about which screenings and vaccines you need and how often you need them. This information is not intended to replace advice given to you by your health care provider. Make sure you discuss any questions you have with your health care provider. Document Released: 04/20/2015 Document Revised: 12/12/2015 Document Reviewed: 01/23/2015 Elsevier Interactive Patient Education  2017 [place] Prevention in the Home Falls can cause injuries. They can happen to people of all ages. There are many things you can do to make your home safe and to help prevent falls. What can I do on the outside of my home? Regularly fix the edges of walkways and driveways and fix any cracks. Remove anything that might make you trip as you walk through a door, such as a raised step or threshold. Trim any bushes or trees on the path to your home. Use bright outdoor lighting. Clear any walking paths of anything that might make someone trip, such as rocks or tools. Regularly check to see if handrails are loose or broken. Make sure that both sides of any steps have handrails. Any raised decks and porches should have guardrails on the edges. Have any leaves, snow, or ice cleared regularly. Use sand or salt on walking paths during winter. Clean up any spills in your garage right away. This includes oil or grease spills. What can I do in the bathroom? Use night lights. Install grab bars by the toilet and in the tub and shower. Do not use towel bars as grab bars. Use non-skid mats or decals in the tub or shower. If you need to sit down in the shower, use a  plastic, non-slip stool. Keep the floor dry. Clean up any water that spills on the floor as soon as it happens. Remove soap buildup in the tub or shower regularly. Attach bath mats securely with double-sided non-slip rug tape. Do not have throw rugs and other things on the floor that can make you trip. What can I do in the bedroom? Use night lights. Make sure that you have a light by your bed that is easy to reach. Do not use any sheets or blankets that are too big for your bed. They should not hang down onto the floor. Have a firm chair that has side arms. You can use this for support while you get dressed. Do not have throw rugs and other things on the floor that can make you trip. What can I do in the kitchen? Clean up any spills right away. Avoid walking on wet floors. Keep items that you use a lot in easy-to-reach places. If you need to reach something above you, use a strong step stool that has a grab bar. Keep electrical cords out of the way. Do not use floor polish or wax that makes floors slippery. If you must use wax, use non-skid floor wax. Do not have throw rugs and other things on the floor that can make you trip. What can I do with my stairs? Do not leave any items on the stairs. Make sure that there are  handrails on both sides of the stairs and use them. Fix handrails that are broken or loose. Make sure that handrails are as long as the stairways. Check any carpeting to make sure that it is firmly attached to the stairs. Fix any carpet that is loose or worn. Avoid having throw rugs at the top or bottom of the stairs. If you do have throw rugs, attach them to the floor with carpet tape. Make sure that you have a light switch at the top of the stairs and the bottom of the stairs. If you do not have them, ask someone to add them for you. What else can I do to help prevent falls? Wear shoes that: Do not have high heels. Have rubber bottoms. Are comfortable and fit you  well. Are closed at the toe. Do not wear sandals. If you use a stepladder: Make sure that it is fully opened. Do not climb a closed stepladder. Make sure that both sides of the stepladder are locked into place. Ask someone to hold it for you, if possible. Clearly mark and make sure that you can see: Any grab bars or handrails. First and last steps. Where the edge of each step is. Use tools that help you move around (mobility aids) if they are needed. These include: Canes. Walkers. Scooters. Crutches. Turn on the lights when you go into a dark area. Replace any light bulbs as soon as they burn out. Set up your furniture so you have a clear path. Avoid moving your furniture around. If any of your floors are uneven, fix them. If there are any pets around you, be aware of where they are. Review your medicines with your doctor. Some medicines can make you feel dizzy. This can increase your chance of falling. Ask your doctor what other things that you can do to help prevent falls. This information is not intended to replace advice given to you by your health care provider. Make sure you discuss any questions you have with your health care provider. Document Released: 01/18/2009 Document Revised: 08/30/2015 Document Reviewed: 04/28/2014 Elsevier Interactive Patient Education  2017 [employer]  Managing Pain Without Opioids Opioids are strong medicines used to treat moderate to severe pain. For some people, especially those who have long-term (chronic) pain, opioids may not be the best choice for pain management due to: Side effects like nausea, constipation, and sleepiness. The risk of addiction (opioid use disorder). The longer you take opioids, the greater your risk of addiction. Pain that lasts for more than 3 months is called chronic pain. Managing chronic pain usually requires more than one approach and is often provided by a team of health care providers working together  (multidisciplinary approach). Pain management may be done at a pain management center or pain clinic. Types of pain management without opioids Managing pain without opioids can involve: Non-opioid medicines. Exercises to help relieve pain and improve strength and range of motion (physical therapy). Therapy to help with everyday tasks and activities (occupational therapy). Therapy to help you find ways to relieve pain by doing things you enjoy (recreational therapy). Talk therapy (psychotherapy) and other mental health therapies. Medical treatments such as injections or devices. Making lifestyle changes. Pain management options Non-opioid medicines Non-opioid medicines for pain may include medicines taken by mouth (oral medicines), such as: Over-the-counter or prescription NSAIDs. These may be the first medicines used for pain. They work well for muscle and bone pain, and they reduce swelling. Acetaminophen. This over-the-counter medicine may work  well for milder pain but not swelling. Antidepressants. These may be used to treat chronic pain. A certain type of antidepressant (tricyclics) is often used. These medicines are given in lower doses for pain than when used for depression. Anticonvulsants. These are usually used to treat seizures but may also reduce nerve (neuropathic) pain. Muscle relaxants. These relieve pain caused by sudden muscle tightening (spasms). You may also use a type of pain medicine that is applied to the skin as a patch, cream, or gel (topical analgesic), such as a numbing medicine. These may cause fewer side effects than oralmedicines. Therapy Physical therapy involves doing exercises to gain strength and flexibility. A physical therapist may teach you exercises to move and stretch parts of your body that are weak, stiff, or painful. You can learn these exercises at physical therapy visits and practice them at home. Physical therapy may also involve: Massage. Heat wraps  or applying heat or cold to affected areas. Sending electrical signals through the skin to interrupt pain signals (transcutaneous electrical nerve stimulation, TENS). Sending weak lasers through the skin to reduce pain and swelling (low-level laser therapy). Using signals from your body to help you learn to regulate pain (biofeedback). Occupational therapy helps you learn ways to function at home and work withless pain. Recreational therapy may involve trying new activities or hobbies, such asdrawing or a physical activity. Types of mental health therapy for pain include: Cognitive behavioral therapy (CBT) to help you learn coping skills for dealing with pain. Acceptance and commitment therapy (ACT) to change the way you think and react to pain. Relaxation therapies, including muscle relaxation exercises and focusing your mind on the present moment to lower stress (mindfulness-based stress reduction). Pain management counseling. This may be individual, family, or group counseling.  Medical treatments Medical treatments for pain management include: Nerve block injections. These may include a pain blocker and anti-inflammatory medicines. You may have injections: Near the spine to relieve chronic back or neck pain. Into joints to relieve back or joint pain. Into nerve areas that supply a painful area to relieve body pain. Into muscles (trigger point injections) to relieve some painful muscle conditions. A medical device placed near your spine to help block pain signals and relieve nerve pain or chronic back pain (spinal cord stimulation device). Acupuncture. Follow these instructions at home Medicines Take over-the-counter and prescription medicines only as told by your health care provider. If you are taking pain medicine, ask your health care providers about possible side effects to watch out for. Do not drive or use heavy machinery while taking prescription pain medicine. Lifestyle  Do  not use drugs or alcohol to reduce pain. Limit alcohol intake to no more than 1 drink a day for nonpregnant women and 2 drinks a day for men. One drink equals 12 oz of beer, 5 oz of wine, or 1 oz of hard liquor. Do not use any products that contain nicotine or tobacco, such as cigarettes and e-cigarettes. These can delay healing. If you need help quitting, ask your health care provider. Eat a healthy diet and maintain a healthy weight. Poor diet and excess weight may make pain worse. Eat foods that are high in fiber. These include fresh fruits and vegetables, whole grains, and beans. Limit foods that are high in fat and processed sugars, such as fried and sweet foods. Exercise regularly. Exercise lowers stress and may help relieve pain. Ask your health care provider what activities and exercises are safe for you. If your health  care provider approves, join an exercise class that combines movement and stress reduction. Examples include yoga and tai chi. Get enough sleep. Lack of sleep may make pain worse. Lower stress as much as possible. Practice stress reduction techniques as told by your therapist.  General instructions Work with all your pain management providers to find the treatments that work best for you. You are an important member of your pain management team. There are many things you can do to reduce pain on your own. Consider joining an online or in-person support group for people who have chronic pain. Keep all follow-up visits as told by your health care providers. This is important. Where to find more information You can find more information about managing pain without opioids from: American Academy of Pain Medicine: painmed.[place] for Chronic Pain: instituteforchronicpain.org American Chronic Pain Association: theacpa.org Contact a health care provider if: You have side effects from pain medicine. Your pain gets worse or does not get better with treatments or home  care. You are struggling with anxiety or depression. Summary Many types of pain can be managed without opioids. Chronic pain may respond better to pain management without opioids. Pain is best managed with a team of providers working together. Pain management without opioids may include non-opioid medicines, medical treatments, physical therapy, mental health therapy, and lifestyle changes. Tell your health care providers if your pain gets worse or is not being managed well enough. This information is not intended to replace advice given to you by your health care provider. Make sure you discuss any questions you have with your healthcare provider. Document Revised: 01/03/2020 Document Reviewed: 01/05/2020 Elsevier Patient Education  [place].

## 2020-09-18 NOTE — Progress Notes (Signed)
Subjective:   John [name] is a 75 y.o. male who presents for Medicare Annual/Subsequent preventive examination.  Virtual Visit via Telephone Note  I connected with  Frederica Kuster on 09/18/20 at  2:00 PM EDT by telephone and verified that I am speaking with the correct person using two identifiers.  Location: Patient: Home Provider: [facility] Persons participating in the virtual visit: patient/Nurse Health Advisor   I discussed the limitations, risks, security and privacy concerns of performing an evaluation and management service by telephone and the availability of in person appointments. The patient expressed understanding and agreed to proceed.  Interactive audio and video telecommunications were attempted between this nurse and patient, however failed, due to patient having technical difficulties OR patient did not have access to video capability.  We continued and completed visit with audio only.  Some vital signs may be absent or patient reported.   Shivank Pinedo E Daliyah Sramek, LPN   Review of Systems     Cardiac Risk Factors include: advanced age (>45mn, >>27women);diabetes mellitus;dyslipidemia;family history of premature cardiovascular disease;hypertension;smoking/ tobacco exposure;sedentary lifestyle;male gender;microalbuminuria     Objective:    Today's Vitals   09/18/20 1408 09/18/20 1409  Weight: 156 lb (70.8 kg)   Height: 5' 6"  (1.676 m)   PainSc:  0-No pain   Body mass index is 25.18 kg/m.  Advanced Directives 09/18/2020 08/23/2020 08/20/2020 08/15/2020 08/14/2020 07/13/2020 06/02/2020  Does Patient Have a Medical Advance Directive? Yes Yes Yes Yes Yes Yes No  Type of AParamedicof AAguilaLiving will HBoulder FlatsLiving will HMeadowbrook FarmLiving will Healthcare Power of APickensLiving will [place]Living will -  Does patient want to make changes to medical advance  directive? - No - Patient declined No - Patient declined No - Patient declined - No - Patient declined -  Copy of HEversonin Chart? No - copy requested No - copy requested - - No - copy requested No - copy requested -  Would patient like information on creating a medical advance directive? - - - - - - No - Patient declined    Current Medications (verified) Outpatient Encounter Medications as of 09/18/2020  Medication Sig   albuterol (VENTOLIN HFA) 108 (90 Base) MCG/ACT inhaler Inhale 2 puffs into the lungs every 4 (four) hours as needed for wheezing or shortness of breath.   amoxicillin-clavulanate (AUGMENTIN) 875-125 MG tablet Take 1 tablet by mouth 2 (two) times daily.   apixaban (ELIQUIS) 5 MG TABS tablet Take 1 tablet (5 mg total) by mouth 2 (two) times daily.   Blood Glucose Monitoring Suppl (ONETOUCH VERIO REFLECT) w/Device KIT Use to test blood sugars daily as directed. DX: E11.9   cloNIDine (CATAPRES) 0.1 MG tablet TAKE 1 TABLET (0.1 MG TOTAL) BY MOUTH 3 (THREE) TIMES DAILY.   dutasteride (AVODART) 0.5 MG capsule TAKE 1 CAPSULE BY MOUTH EVERY DAY (Patient taking differently: No sig reported)   fluticasone (FLONASE) 50 MCG/ACT nasal spray Place 1 spray into both nostrils daily as needed for allergies or rhinitis. (Patient taking differently: Place 1 spray into both nostrils daily.)   gabapentin (NEURONTIN) 300 MG capsule TAKE 1 CAPSULE BY MOUTH THREE TIMES A DAY (Patient taking differently: Take 300 mg by mouth 3 (three) times daily.)   glucose blood (ONETOUCH VERIO) test strip Use to test blood sugars daily as directed. DX: E11.9   Iron, Ferrous Sulfate, 325 (65 Fe) MG TABS Take 325 mg  by mouth 2 (two) times daily.   levothyroxine (SYNTHROID) 50 MCG tablet Take 1 tablet (50 mcg total) by mouth daily.   linaclotide (LINZESS) 145 MCG CAPS capsule Take 1 capsule (145 mcg total) by mouth daily. To regulate bowel movements   losartan (COZAAR) 50 MG tablet Take 0.5 tablets  (25 mg total) by mouth daily.   metFORMIN (GLUCOPHAGE-XR) 500 MG 24 hr tablet Take 1 tablet (500 mg total) by mouth daily with breakfast.   nicotine (NICODERM CQ - DOSED IN MG/24 HOURS) 14 mg/24hr patch Place 1 patch (14 mg total) onto the skin daily.   nystatin (MYCOSTATIN) 100000 UNIT/ML suspension Take 5 mLs (500,000 Units total) by mouth 4 (four) times daily.   ondansetron (ZOFRAN-ODT) 4 MG disintegrating tablet TAKE 1 TABLET BY MOUTH EVERY 8 HOURS AS NEEDED FOR NAUSEA AND VOMITING   OneTouch Delica Lancets 03K MISC Use to test blood sugars daily as directed. DX: E11.9   potassium chloride SA (KLOR-CON) 20 MEQ tablet Take 1 tablet (20 mEq total) by mouth daily.   pravastatin (PRAVACHOL) 40 MG tablet TAKE 1 TABLET (40 MG TOTAL) BY MOUTH ON MONDAYS , WEDNESDAYS, AND FRIDAYS AS DIRECTED   Semaglutide (RYBELSUS) 7 MG TABS Take 7 mg by mouth daily.   temazepam (RESTORIL) 7.5 MG capsule Take 1 capsule (7.5 mg total) by mouth at bedtime as needed for sleep.   torsemide 40 MG TABS Take 40 mg by mouth daily.   No facility-administered encounter medications on file as of 09/18/2020.    Allergies (verified) Jardiance [empagliflozin], Lopressor [metoprolol], Atorvastatin, Rosuvastatin, and Sulfa antibiotics   History: Past Medical History:  Diagnosis Date   Adenocarcinoma of lung, right ([place]) 04/18/2016   Anxiety    Arthritis    Asthma    BPH (benign prostatic hyperplasia)    with urinary retention 02/06/20   CHF (congestive heart failure) (HCC)    COPD (chronic obstructive pulmonary disease) (HCC)    Depression    Diabetes mellitus without complication ([place])    no meds   Diabetes mellitus, type II ([place])    DM (diabetes mellitus) ([place]) 07/09/2016   Dyspnea    History of kidney stones    History of radiation therapy 06/25/16-08/20/16   right lung   Hyperlipidemia    Hypertension    Hypertension    Hypothyroidism    Macular degeneration    Neuropathy    Non-small cell lung cancer, right  ([place]) 04/18/2016   Peripheral vascular disease ([place])    Prostatitis    Pulmonary nodule, left 07/16/2016   Sleep apnea    cpap   Past Surgical History:  Procedure Laterality Date   ABDOMINAL AORTOGRAM W/LOWER EXTREMITY Left 02/06/2020   Procedure: ABDOMINAL AORTOGRAM W/LOWER EXTREMITY;  Surgeon: Lorretta Harp, MD;  Location: [place] [facility];  Service: Cardiovascular;  Laterality: Left;   CATARACT EXTRACTION, BILATERAL Bilateral    ENDARTERECTOMY FEMORAL Right 08/20/2020   Procedure: ENDARTERECTOMY  RIGHT FEMORAL ARTERY;  Surgeon: Marty Heck, MD;  Location: [place];  Service: Vascular;  Laterality: Right;   ESOPHAGOGASTRODUODENOSCOPY (EGD) WITH PROPOFOL N/A 09/06/2020   Procedure: ESOPHAGOGASTRODUODENOSCOPY (EGD) WITH PROPOFOL;  Surgeon: Irene Shipper, MD;  Location: [facility];  Service: Endoscopy;  Laterality: N/A;   INSERTION OF ILIAC STENT Right 08/20/2020   Procedure: RETROGRADE INSERTION OF RIGHT ILIAC STENT;  Surgeon: Marty Heck, MD;  Location: [place];  Service: Vascular;  Laterality: Right;   INTRAOPERATIVE ARTERIOGRAM Right 08/20/2020   Procedure: INTRA OPERATIVE ARTERIOGRAM  ILIAC;  Surgeon: Marty Heck, MD;  Location: [place];  Service: Vascular;  Laterality: Right;   PATCH ANGIOPLASTY Right 08/20/2020   Procedure: PATCH ANGIOPLASTY RIGHT FEMORAL ARTERY;  Surgeon: Marty Heck, MD;  Location: [place];  Service: Vascular;  Laterality: Right;   PORTACATH PLACEMENT Left 06/13/2016   Procedure: INSERTION PORT-A-CATH;  Surgeon: Aviva Signs, MD;  Location: [facility];  Service: General;  Laterality: Left;   TRANSURETHRAL RESECTION OF PROSTATE N/A 05/31/2020   Procedure: TRANSURETHRAL RESECTION OF THE PROSTATE (TURP);  Surgeon: Cleon Gustin, MD;  Location: [facility];  Service: Urology;  Laterality: N/A;   VIDEO BRONCHOSCOPY WITH ENDOBRONCHIAL NAVIGATION N/A 05/28/2016   Procedure: VIDEO BRONCHOSCOPY WITH ENDOBRONCHIAL NAVIGATION;  Surgeon: [name],  MD;  Location: [place];  Service: Thoracic;  Laterality: N/A;   VIDEO BRONCHOSCOPY WITH ENDOBRONCHIAL ULTRASOUND N/A 05/28/2016   Procedure: VIDEO BRONCHOSCOPY WITH ENDOBRONCHIAL ULTRASOUND;  Surgeon: [name], MD;  Location: [place];  Service: Thoracic;  Laterality: N/A;   Family History  Problem Relation Age of Onset   Hypertension Mother    Diabetes Father    Heart disease Father    Stroke Father    Hypertension Sister    Social History   Socioeconomic History   Marital status: Soil scientist    Spouse name: Not on file   Number of children: 5   Years of education: 10   Highest education level: 10th grade  Occupational History   Occupation: Retired  Tobacco Use   Smoking status: Every Day    Packs/day: 0.50    Years: 55.00    Pack years: 27.50    Types: Cigarettes    Start date: 03/11/1961   Smokeless tobacco: Never   Tobacco comments:    1/2 pack to 1 pack per day 12/14/19  Vaping Use   Vaping Use: Never used  Substance and Sexual Activity   Alcohol use: No   Drug use: No   Sexual activity: Yes    Birth control/protection: None  Other Topics Concern   Not on file  Social History Narrative   [name] is his POA and lives with him. Children out of town - one in [facility], others in [place].   Social Determinants of Health   Financial Resource Strain: Low Risk    Difficulty of Paying Living Expenses: Not hard at all  Food Insecurity: No Food Insecurity   Worried About Charity fundraiser in the Last Year: Never true   [place] in the Last Year: Never true  Transportation Needs: No Transportation Needs   Lack of Transportation (Medical): No   Lack of Transportation (Non-Medical): No  Physical Activity: Unknown   Days of Exercise per Week: 7 days   Minutes of Exercise per Session: Not on file  Stress: No Stress Concern Present   Feeling of Stress : Not at all  Social Connections: Moderately Isolated   Frequency of Communication with Friends and  Family: More than three times a week   Frequency of Social Gatherings with Friends and Family: Once a week   Attends Religious Services: Never   Marine scientist or Organizations: No   Attends Music therapist: Never   Marital Status: Living with partner    Tobacco Counseling Ready to quit: Not Answered Counseling given: Not Answered Tobacco comments: 1/2 pack to 1 pack per day 12/14/19   Clinical Intake:  Pre-visit preparation completed: Yes  Pain : No/denies  pain Pain Score: 0-No pain Pain Type: Chronic pain Pain Onset: More than a month ago Pain Frequency: Intermittent     BMI - recorded: 25.18 Nutritional Status: BMI 25 -29 Overweight Nutritional Risks: Nausea/ vomitting/ diarrhea Diabetes: Yes CBG done?: No Did pt. bring in CBG monitor from home?: No     Nutrition Risk Assessment:  Has the patient had any N/V/D within the last 2 months?  Yes  Does the patient have any non-healing wounds?  No  Has the patient had any unintentional weight loss or weight gain?  Yes   Diabetes:  Is the patient diabetic?  Yes  If diabetic, was a CBG obtained today?  No  Did the patient bring in their glucometer from home?  No  How often do you monitor your CBG's? Usually once per day - he gets busy and forgets frequently.   Financial Strains and Diabetes Management:  Are you having any financial strains with the device, your supplies or your medication? No .  Does the patient want to be seen by Chronic Care Management for management of their diabetes?  No  Would the patient like to be referred to a Nutritionist or for Diabetic Management?  No   Diabetic Exams:  Diabetic Eye Exam: Completed 07/02/2020.   Diabetic Foot Exam: Completed 09/12/2020. Pt has been advised about the importance in completing this exam. Pt is scheduled for diabetic foot exam on next year.    Interpreter Needed?: No  Information entered by :: Ameera Tigue, LPN   Activities of Daily  Living In your present state of health, do you have any difficulty performing the following activities: 09/18/2020 08/20/2020  Hearing? N -  Vision? N -  Difficulty concentrating or making decisions? N -  Walking or climbing stairs? N -  Dressing or bathing? N -  Doing errands, shopping? N N  Preparing Food and eating ? N -  Using the Toilet? N -  In the past six months, have you accidently leaked urine? Y -  Do you have problems with loss of bowel control? N -  Managing your Medications? N -  Managing your Finances? N -  Housekeeping or managing your Housekeeping? N -  Some recent data might be hidden    Patient Care Team: [name], FNP as PCP - General (Family Medicine) Derek Jack, MD as Consulting Physician (Hematology) Lavera Guise, [facility] (Pharmacist) O'Neal, Cassie Freer, MD as Consulting Physician (Cardiology) Ilean China, RN as [facility] Management McKenzie, Candee Furbish, MD as Consulting Physician (Urology) Celestia Khat, [place] (Optometry) Brita Romp, NP as Nurse Practitioner (Endocrinology) Steffanie Rainwater, DPM as Consulting Physician (Podiatry) Marty Heck, MD as Consulting Physician (Vascular Surgery)  Indicate any recent Medical Services you may have received from other than [facility] providers in the past year (date may be approximate).     Assessment:   This is a routine wellness examination for Davyn.  Hearing/Vision screen Hearing Screening - Comments:: Denies hearing difficulties  Vision Screening - Comments:: No vision concerns - up to date with annual eye exam at [facility] in [place] issues and exercise activities discussed: Current Exercise Habits: Home exercise routine, Type of exercise: walking, Time (Minutes): 20, Frequency (Times/Week): 7, Weekly Exercise (Minutes/Week): 140, Intensity: Mild, Exercise limited by: cardiac condition(s);respiratory conditions(s)   Goals Addressed              This Visit's Progress    Have 3 meals a day  Improving    Try to eat 3 meals daily that consist of lean proteins, fruit and vegetables        Depression Screen PHQ 2/9 Scores 09/18/2020 09/13/2020 09/07/2020 09/05/2020 08/30/2020 08/14/2020 08/08/2020  PHQ - 2 Score 0 2 0 2 1 0 0  PHQ- 9 Score 5 10 - 10 7 - -    Fall Risk Fall Risk  09/18/2020 09/13/2020 09/07/2020 09/05/2020 08/30/2020  Falls in the past year? 1 1 0 0 0  Number falls in past yr: 0 0 - - -  Injury with Fall? 0 0 - - -  Risk for fall due to : History of fall(s);Impaired balance/gait;Medication side effect;Orthopedic patient History of fall(s) - - -  Follow up Education provided;Falls prevention discussed Education provided - - -    FALL RISK PREVENTION PERTAINING TO THE HOME:  Any stairs in or around the home? Yes  If so, are there any without handrails? No  Home free of loose throw rugs in walkways, pet beds, electrical cords, etc? Yes  Adequate lighting in your home to reduce risk of falls? Yes   ASSISTIVE DEVICES UTILIZED TO PREVENT FALLS:  Life alert? No  Use of a cane, walker or w/c? Yes  Grab bars in the bathroom? Yes  Shower chair or bench in shower? Yes  Elevated toilet seat or a handicapped toilet? No   TIMED UP AND GO:  Was the test performed? No . Telephonic visit.  Cognitive Function: Normal cognitive status assessed by direct observation by this Nurse Health Advisor. No abnormalities found.   MMSE - Mini Mental State Exam 06/17/2017  Orientation to time 5  Orientation to Place 4  Registration 3  Attention/ Calculation 5  Recall 3  Language- name 2 objects 2  Language- repeat 1  Language- follow 3 step command 3  Language- read & follow direction 1  Write a sentence 1  Copy design 0  Total score 28     6CIT Screen 05/25/2019  What Year? 0 points  What month? 0 points  What time? 0 points  Count back from 20 0 points  Months in reverse 0 points  Repeat phrase 0 points  Total Score 0     Immunizations Immunization History  Administered Date(s) Administered   Fluad Quad(high Dose 65+) 01/19/2019, 02/01/2020   Influenza, High Dose Seasonal PF 12/28/2015   Influenza,inj,Quad PF,6+ Mos 01/23/2017   Moderna Sars-Covid-2 Vaccination 05/10/2019, 06/07/2019, 01/31/2020   Pneumococcal Conjugate-13 06/17/2017   Pneumococcal Polysaccharide-23 04/25/2019   Tdap 05/17/2019   Zoster, Live 07/10/2014    TDAP status: Up to date  Flu Vaccine status: Up to date  Pneumococcal vaccine status: Up to date  Covid-19 vaccine status: Completed vaccines  Qualifies for Shingles Vaccine? Yes   Zostavax completed Yes   Shingrix Completed?: No.    Education has been provided regarding the importance of this vaccine. Patient has been advised to call insurance company to determine out of pocket expense if they have not yet received this vaccine. Advised may also receive vaccine at local pharmacy or Health Dept. Verbalized acceptance and understanding.  Screening Tests Health Maintenance  Topic Date Due   FOOT EXAM  09/12/2020   COVID-19 Vaccine (4 - Booster for Moderna series) 09/29/2020 (Originally 05/02/2020)   Zoster Vaccines- Shingrix (1 of 2) 12/14/2020 (Originally 03/11/1965)   INFLUENZA VACCINE  11/05/2020   Fecal DNA (Cologuard)  01/29/2021   HEMOGLOBIN A1C  02/20/2021   OPHTHALMOLOGY EXAM  07/02/2021   TETANUS/TDAP  05/16/2029   Hepatitis C Screening  Completed   PNA vac Low Risk Adult  Completed   HPV VACCINES  Aged Out    Health Maintenance  Health Maintenance Due  Topic Date Due   FOOT EXAM  09/12/2020    Colorectal cancer screening: Type of screening: Cologuard. Completed 01/29/2018. Repeat every 3 years  Lung Cancer Screening: (Low Dose CT Chest recommended if Age 93-80 years, 30 pack-year currently smoking OR have quit w/in 15years.) does qualify.   Lung Cancer Screening Referral: Has small cell lung cancer - next chest ct scheduled for 12/16/2020  Additional  Screening:  Hepatitis C Screening: does qualify; Completed 12/29/2017  Vision Screening: Recommended annual ophthalmology exams for early detection of glaucoma and other disorders of the eye. Is the patient up to date with their annual eye exam?  Yes  Who is the provider or what is the name of the office in which the patient attends annual eye exams? [facility] If pt is not established with a provider, would they like to be referred to a provider to establish care? No .   Dental Screening: Recommended annual dental exams for proper oral hygiene  Community Resource Referral / Chronic Care Management: CRR required this visit?  No   CCM required this visit?  No      Plan:     I have personally reviewed and noted the following in the patient's chart:   Medical and social history Use of alcohol, tobacco or illicit drugs  Current medications and supplements including opioid prescriptions. Patient is currently taking opioid prescriptions. Information provided to patient regarding non-opioid alternatives. Patient advised to discuss non-opioid treatment plan with their provider. Functional ability and status Nutritional status Physical activity Advanced directives List of other physicians Hospitalizations, surgeries, and ER visits in previous 12 months Vitals Screenings to include cognitive, depression, and falls Referrals and appointments  In addition, I have reviewed and discussed with patient certain preventive protocols, quality metrics, and best practice recommendations. A written personalized care plan for preventive services as well as general preventive health recommendations were provided to patient.     Sandrea Hammond, LPN   [id number]/84/[id number]   Nurse Notes: None

## 2020-09-19 DIAGNOSIS — I509 Heart failure, unspecified: Secondary | ICD-10-CM | POA: Diagnosis not present

## 2020-09-19 DIAGNOSIS — E1151 Type 2 diabetes mellitus with diabetic peripheral angiopathy without gangrene: Secondary | ICD-10-CM | POA: Diagnosis not present

## 2020-09-19 DIAGNOSIS — I4892 Unspecified atrial flutter: Secondary | ICD-10-CM | POA: Diagnosis not present

## 2020-09-19 DIAGNOSIS — Z95820 Peripheral vascular angioplasty status with implants and grafts: Secondary | ICD-10-CM | POA: Diagnosis not present

## 2020-09-19 DIAGNOSIS — Z48812 Encounter for surgical aftercare following surgery on the circulatory system: Secondary | ICD-10-CM | POA: Diagnosis not present

## 2020-09-19 DIAGNOSIS — I110 Hypertensive heart disease with heart failure: Secondary | ICD-10-CM | POA: Diagnosis not present

## 2020-09-24 ENCOUNTER — Other Ambulatory Visit: Payer: Self-pay

## 2020-09-24 ENCOUNTER — Ambulatory Visit (INDEPENDENT_AMBULATORY_CARE_PROVIDER_SITE_OTHER): Payer: Medicare Other | Admitting: Nurse Practitioner

## 2020-09-24 ENCOUNTER — Encounter: Payer: Self-pay | Admitting: Nurse Practitioner

## 2020-09-24 VITALS — BP 138/66 | HR 99 | Ht 66.0 in | Wt 155.0 lb

## 2020-09-24 DIAGNOSIS — E039 Hypothyroidism, unspecified: Secondary | ICD-10-CM

## 2020-09-24 DIAGNOSIS — E1142 Type 2 diabetes mellitus with diabetic polyneuropathy: Secondary | ICD-10-CM

## 2020-09-24 LAB — POCT UA - MICROALBUMIN
Albumin/Creatinine Ratio, Urine, POC: <30
Creatinine, POC: 200 mg/dL
Microalbumin Ur, POC: 30 mg/L

## 2020-09-24 NOTE — Progress Notes (Signed)
Endocrinology Follow Up Note       09/24/2020, 3:15 PM   Subjective:    Patient ID: John [name], male    DOB: Apr 13, 1945.  John [name] is being seen in follow up after being seen in consultation for management of currently uncontrolled symptomatic diabetes requested by  [name], FNP.   Past Medical History:  Diagnosis Date   Adenocarcinoma of lung, right ([place]) 04/18/2016   Anxiety    Arthritis    Asthma    BPH (benign prostatic hyperplasia)    with urinary retention 02/06/20   CHF (congestive heart failure) (HCC)    COPD (chronic obstructive pulmonary disease) ([place])    Depression    Diabetes mellitus without complication ([place])    no meds   Diabetes mellitus, type II ([place])    DM (diabetes mellitus) ([place]) 07/09/2016   Dyspnea    History of kidney stones    History of radiation therapy 06/25/16-08/20/16   right lung   Hyperlipidemia    Hypertension    Hypertension    Hypothyroidism    Macular degeneration    Neuropathy    Non-small cell lung cancer, right ([place]) 04/18/2016   Peripheral vascular disease ([place])    Prostatitis    Pulmonary nodule, left 07/16/2016   Sleep apnea    cpap    Past Surgical History:  Procedure Laterality Date   ABDOMINAL AORTOGRAM W/LOWER EXTREMITY Left 02/06/2020   Procedure: ABDOMINAL AORTOGRAM W/LOWER EXTREMITY;  Surgeon: Lorretta Harp, MD;  Location: [facility];  Service: Cardiovascular;  Laterality: Left;   CATARACT EXTRACTION, BILATERAL Bilateral    ENDARTERECTOMY FEMORAL Right 08/20/2020   Procedure: ENDARTERECTOMY  RIGHT FEMORAL ARTERY;  Surgeon: Marty Heck, MD;  Location: [facility];  Service: Vascular;  Laterality: Right;   ESOPHAGOGASTRODUODENOSCOPY (EGD) WITH PROPOFOL N/A 09/06/2020   Procedure: ESOPHAGOGASTRODUODENOSCOPY (EGD) WITH PROPOFOL;  Surgeon: Irene Shipper, MD;  Location: [facility];  Service: Endoscopy;  Laterality: N/A;    INSERTION OF ILIAC STENT Right 08/20/2020   Procedure: RETROGRADE INSERTION OF RIGHT ILIAC STENT;  Surgeon: Marty Heck, MD;  Location: [facility];  Service: Vascular;  Laterality: Right;   INTRAOPERATIVE ARTERIOGRAM Right 08/20/2020   Procedure: INTRA OPERATIVE ARTERIOGRAM ILIAC;  Surgeon: Marty Heck, MD;  Location: [facility];  Service: Vascular;  Laterality: Right;   PATCH ANGIOPLASTY Right 08/20/2020   Procedure: PATCH ANGIOPLASTY RIGHT FEMORAL ARTERY;  Surgeon: Marty Heck, MD;  Location: [facility];  Service: Vascular;  Laterality: Right;   PORTACATH PLACEMENT Left 06/13/2016   Procedure: INSERTION PORT-A-CATH;  Surgeon: Aviva Signs, MD;  Location: [facility];  Service: General;  Laterality: Left;   TRANSURETHRAL RESECTION OF PROSTATE N/A 05/31/2020   Procedure: TRANSURETHRAL RESECTION OF THE PROSTATE (TURP);  Surgeon: Cleon Gustin, MD;  Location: [facility];  Service: Urology;  Laterality: N/A;   VIDEO BRONCHOSCOPY WITH ENDOBRONCHIAL NAVIGATION N/A 05/28/2016   Procedure: VIDEO BRONCHOSCOPY WITH ENDOBRONCHIAL NAVIGATION;  Surgeon: [name], MD;  Location: [facility];  Service: Thoracic;  Laterality: N/A;   VIDEO BRONCHOSCOPY WITH ENDOBRONCHIAL ULTRASOUND N/A 05/28/2016   Procedure: VIDEO BRONCHOSCOPY WITH ENDOBRONCHIAL ULTRASOUND;  Surgeon: [name],  MD;  Location: [facility];  Service: Thoracic;  Laterality: N/A;    Social History   Socioeconomic History   Marital status: Soil scientist    Spouse name: Not on file   Number of children: 5   Years of education: 10   Highest education level: 10th grade  Occupational History   Occupation: Retired  Tobacco Use   Smoking status: Every Day    Packs/day: 0.50    Years: 55.00    Pack years: 27.50    Types: Cigarettes    Start date: 03/11/1961   Smokeless tobacco: Never   Tobacco comments:    1/2 pack to 1 pack per day 12/14/19  Vaping Use   Vaping Use: Never used  Substance and Sexual Activity   Alcohol use: No    Drug use: No   Sexual activity: Yes    Birth control/protection: None  Other Topics Concern   Not on file  Social History Narrative   [name] is his POA and lives with him. Children out of town - one in [place] [facility] [place] [facility] [place], others in [place].   Social Determinants of Health   Financial Resource Strain: Low Risk    Difficulty of Paying Living Expenses: Not hard at all  Food Insecurity: No Food Insecurity   Worried About Charity fundraiser in the Last Year: Never true   [place] in the Last Year: Never true  Transportation Needs: No Transportation Needs   Lack of Transportation (Medical): No   Lack of Transportation (Non-Medical): No  Physical Activity: Unknown   Days of Exercise per Week: 7 days   Minutes of Exercise per Session: Not on file  Stress: No Stress Concern Present   Feeling of Stress : Not at all  Social Connections: Moderately Isolated   Frequency of Communication with Friends and Family: More than three times a week   Frequency of Social Gatherings with Friends and Family: Once a week   Attends Religious Services: Never   Marine scientist or Organizations: No   Attends Music therapist: Never   Marital Status: Living with partner    Family History  Problem Relation Age of Onset   Hypertension Mother    Diabetes Father    Heart disease Father    Stroke Father    Hypertension Sister     Outpatient Encounter Medications as of 09/24/2020  Medication Sig   albuterol (VENTOLIN HFA) 108 (90 Base) MCG/ACT inhaler Inhale 2 puffs into the lungs every 4 (four) hours as needed for wheezing or shortness of breath.   apixaban (ELIQUIS) 5 MG TABS tablet Take 1 tablet (5 mg total) by mouth 2 (two) times daily.   Blood Glucose Monitoring Suppl (ONETOUCH VERIO REFLECT) w/Device KIT Use to test blood sugars daily as directed. DX: E11.9   cloNIDine (CATAPRES) 0.1 MG tablet TAKE 1 TABLET (0.1 MG TOTAL) BY MOUTH 3 (THREE) TIMES DAILY.   dutasteride (AVODART)  0.5 MG capsule TAKE 1 CAPSULE BY MOUTH EVERY DAY (Patient taking differently: No sig reported)   fluticasone (FLONASE) 50 MCG/ACT nasal spray Place 1 spray into both nostrils daily as needed for allergies or rhinitis. (Patient taking differently: Place 1 spray into both nostrils daily.)   gabapentin (NEURONTIN) 300 MG capsule TAKE 1 CAPSULE BY MOUTH THREE TIMES A DAY (Patient taking differently: Take 300 mg by mouth 3 (three) times daily.)   glucose blood (ONETOUCH VERIO) test strip Use to test blood sugars daily as directed. DX:  E11.9   Iron, Ferrous Sulfate, 325 (65 Fe) MG TABS Take 325 mg by mouth 2 (two) times daily.   levothyroxine (SYNTHROID) 50 MCG tablet Take 1 tablet (50 mcg total) by mouth daily.   linaclotide (LINZESS) 145 MCG CAPS capsule Take 1 capsule (145 mcg total) by mouth daily. To regulate bowel movements   losartan (COZAAR) 50 MG tablet Take 0.5 tablets (25 mg total) by mouth daily.   metFORMIN (GLUCOPHAGE-XR) 500 MG 24 hr tablet Take 1 tablet (500 mg total) by mouth daily with breakfast.   nicotine (NICODERM CQ - DOSED IN MG/24 HOURS) 14 mg/24hr patch Place 1 patch (14 mg total) onto the skin daily.   nystatin (MYCOSTATIN) 100000 UNIT/ML suspension Take 5 mLs (500,000 Units total) by mouth 4 (four) times daily.   ondansetron (ZOFRAN-ODT) 4 MG disintegrating tablet TAKE 1 TABLET BY MOUTH EVERY 8 HOURS AS NEEDED FOR NAUSEA AND VOMITING   OneTouch Delica Lancets 46T MISC Use to test blood sugars daily as directed. DX: E11.9   potassium chloride SA (KLOR-CON) 20 MEQ tablet Take 1 tablet (20 mEq total) by mouth daily.   pravastatin (PRAVACHOL) 40 MG tablet TAKE 1 TABLET (40 MG TOTAL) BY MOUTH ON MONDAYS , WEDNESDAYS, AND FRIDAYS AS DIRECTED   Semaglutide (RYBELSUS) 7 MG TABS Take 7 mg by mouth daily.   temazepam (RESTORIL) 7.5 MG capsule Take 1 capsule (7.5 mg total) by mouth at bedtime as needed for sleep.   torsemide 40 MG TABS Take 40 mg by mouth daily.   [DISCONTINUED]  amoxicillin-clavulanate (AUGMENTIN) 875-125 MG tablet Take 1 tablet by mouth 2 (two) times daily.   No facility-administered encounter medications on file as of 09/24/2020.    ALLERGIES: Allergies  Allergen Reactions   Jardiance [Empagliflozin] Other (See Comments)    FEELS SLUGGISH, TIRED   Lopressor [Metoprolol] Other (See Comments)    Fatigue   Atorvastatin     Muscle aches - tolerating Pravastatin 40 mg MWF   Rosuvastatin     Muscle aches - tolerating Pravastatin 40 mg MWF   Sulfa Antibiotics Swelling    Mouth swelling    VACCINATION STATUS: Immunization History  Administered Date(s) Administered   Fluad Quad(high Dose 65+) 01/19/2019, 02/01/2020   Influenza, High Dose Seasonal PF 12/28/2015   Influenza,inj,Quad PF,6+ Mos 01/23/2017   Moderna Sars-Covid-2 Vaccination 05/10/2019, 06/07/2019, 01/31/2020   Pneumococcal Conjugate-13 06/17/2017   Pneumococcal Polysaccharide-23 04/25/2019   Tdap 05/17/2019   Zoster, Live 07/10/2014    Diabetes He presents for his follow-up diabetic visit. He has type 2 diabetes mellitus. Onset time: Was diagnosed at approx age of 20. His disease course has been stable. There are no hypoglycemic associated symptoms. Pertinent negatives for hypoglycemia include no nervousness/anxiousness or tremors. Associated symptoms include blurred vision and foot paresthesias. Pertinent negatives for diabetes include no fatigue, no polydipsia, no polyphagia and no weight loss. There are no hypoglycemic complications. Symptoms are improving. Diabetic complications include heart disease, nephropathy, peripheral neuropathy and PVD. Risk factors for coronary artery disease include diabetes mellitus, dyslipidemia, hypertension, male sex, sedentary lifestyle and tobacco exposure. Current diabetic treatment includes oral agent (dual therapy). He is compliant with treatment most of the time. His weight is stable. He is following a generally healthy diet. When asked about  meal planning, he reported none. He has not had a previous visit with a dietitian. He rarely participates in exercise. His home blood glucose trend is fluctuating minimally. His breakfast blood glucose range is generally 130-140 mg/dl. His bedtime blood  glucose range is generally 180-200 mg/dl. (He presents today with his logs, no meter, showing stable glycemic profile overall with slightly above target fasting and postprandial glycemic profile.  His previsit A1c was 7.5% on 5/16, increasing from last visit of 6.7% (yet still an acceptable goal for him).  He has been hospitalized on numerous occasions since last visit for various reasons including multiple UTIs, TURP, and revascularization surgery.  He denies any episodes of hypoglycemia.) An ACE inhibitor/angiotensin II receptor blocker is being taken. He sees a podiatrist.Eye exam is current.  Hyperlipidemia This is a chronic problem. The current episode started more than 1 year ago. The problem is controlled. Recent lipid tests were reviewed and are normal. Exacerbating diseases include chronic renal disease, diabetes and hypothyroidism. Factors aggravating his hyperlipidemia include fatty foods and beta blockers. Current antihyperlipidemic treatment includes statins. The current treatment provides moderate improvement of lipids. There are no compliance problems.  Risk factors for coronary artery disease include diabetes mellitus, dyslipidemia, hypertension, male sex and a sedentary lifestyle.  Hypertension This is a chronic problem. The current episode started more than 1 year ago. The problem has been gradually improving since onset. The problem is controlled. Associated symptoms include blurred vision and palpitations. Agents associated with hypertension include thyroid hormones. Risk factors for coronary artery disease include diabetes mellitus, dyslipidemia, sedentary lifestyle, smoking/tobacco exposure and male gender. Past treatments include  angiotensin blockers, diuretics, beta blockers and alpha 1 blockers. The current treatment provides moderate improvement. There are no compliance problems.  Hypertensive end-organ damage includes kidney disease, CAD/MI and PVD. Identifiable causes of hypertension include chronic renal disease, sleep apnea and a thyroid problem.  Thyroid Problem Presents for follow-up visit. Symptoms include palpitations. Patient reports no anxiety, cold intolerance, constipation, depressed mood, diarrhea, dry skin, fatigue, heat intolerance, tremors or weight loss. (Having cardioversion on 7/1 for Afib) The symptoms have been stable. Past treatments include levothyroxine. The treatment provided moderate relief. His past medical history is significant for diabetes and hyperlipidemia. There are no known risk factors.    Review of systems  Constitutional: + Minimally fluctuating body weight,  current Body mass index is 25.02 kg/m. , no fatigue, no subjective hyperthermia, no subjective hypothermia Eyes: no blurry vision, no xerophthalmia ENT: no sore throat, no nodules palpated in throat, no dysphagia/odynophagia, no hoarseness Cardiovascular: no chest pain, no shortness of breath, no palpitations, no leg swelling Respiratory: no cough, no shortness of breath Gastrointestinal: no nausea/vomiting/diarrhea Musculoskeletal: no muscle/joint aches, walks with cane Skin: no rashes, no hyperemia Neurological: no tremors, no numbness, no tingling, no dizziness Psychiatric: no depression, no anxiety  Objective:     BP 138/66   Pulse 99   Ht 5' 6"  (1.676 m)   Wt 155 lb (70.3 kg)   BMI 25.02 kg/m   Wt Readings from Last 3 Encounters:  09/24/20 155 lb (70.3 kg)  09/18/20 156 lb (70.8 kg)  09/13/20 159 lb 12.8 oz (72.5 kg)     BP Readings from Last 3 Encounters:  09/24/20 138/66  09/13/20 (!) 144/60  09/12/20 (!) 124/56     Physical Exam- Limited  Constitutional:  Body mass index is 25.02 kg/m. , not in  acute distress, normal state of mind Musculoskeletal: no gross deformities, strength intact in all four extremities, no gross restriction of joint movements, walks with cane Skin:  no rashes, no hyperemia Neurological: no tremor with outstretched hands    CMP ( most recent) CMP     Component Value Date/Time  NA 141 09/17/2020 1258   K 3.5 09/17/2020 1258   CL 98 09/17/2020 1258   CO2 27 09/17/2020 1258   GLUCOSE 168 (H) 09/17/2020 1258   GLUCOSE 230 (H) 09/06/2020 0410   BUN 15 09/17/2020 1258   CREATININE 0.90 09/17/2020 1258   CALCIUM 8.9 09/17/2020 1258   PROT 6.4 09/17/2020 1258   ALBUMIN 3.7 09/17/2020 1258   AST 12 09/17/2020 1258   ALT 11 09/17/2020 1258   ALKPHOS 123 (H) 09/17/2020 1258   BILITOT 0.3 09/17/2020 1258   GFRNONAA >60 09/06/2020 0410   GFRAA 102 05/24/2020 1238     Diabetic Labs (most recent): Lab Results  Component Value Date   HGBA1C 7.5 (H) 08/20/2020   HGBA1C 6.4 08/03/2020   HGBA1C 6.7 (H) 05/06/2020     Lipid Panel ( most recent) Lipid Panel     Component Value Date/Time   CHOL 126 08/21/2020 0530   CHOL 136 04/25/2019 1608   TRIG 89 08/21/2020 0530   HDL 28 (L) 08/21/2020 0530   HDL 28 (L) 04/25/2019 1608   CHOLHDL 4.5 08/21/2020 0530   VLDL 18 08/21/2020 0530   LDLCALC 80 08/21/2020 0530   LDLCALC 78 04/25/2019 1608   LABVLDL 30 04/25/2019 1608      Lab Results  Component Value Date   TSH 0.628 08/21/2020   TSH 1.542 08/13/2020   TSH 2.180 08/03/2020   TSH 0.117 (L) 06/02/2020   TSH 1.503 03/23/2020   TSH 1.620 03/20/2020   TSH 0.752 03/03/2020   TSH 1.706 11/24/2019   TSH 1.066 06/28/2019   TSH 1.620 04/25/2019   FREET4 1.96 (H) 06/03/2020   FREET4 1.03 02/20/2017           Assessment & Plan:   1) Type 2 diabetes mellitus with diabetic polyneuropathy, without long-term current use of insulin ([place])  - ANUSH WIEDEMAN has currently uncontrolled symptomatic type 2 DM since 75 years of age.  He presents  today with his logs, no meter, showing stable glycemic profile overall with slightly above target fasting and postprandial glycemic profile.  His previsit A1c was 7.5% on 5/16, increasing from last visit of 6.7% (yet still an acceptable goal for him).  He has been hospitalized on numerous occasions since last visit for various reasons including multiple UTIs, TURP, and revascularization surgery.  He denies any episodes of hypoglycemia.  -Recent labs reviewed.  - I had a long discussion with him about the progressive nature of diabetes and the pathology behind its complications. -his diabetes is complicated by CKD stage 2, PAD, and neuropathy and he remains at a high risk for more acute and chronic complications which include CAD, CVA, and retinopathy. These are all discussed in detail with him.  - Nutritional counseling repeated at each appointment due to patients tendency to fall back in to old habits.  - The patient admits there is a room for improvement in their diet and drink choices. -  Suggestion is made for the patient to avoid simple carbohydrates from their diet including Cakes, Sweet Desserts / Pastries, Ice Cream, Soda (diet and regular), Sweet Tea, Candies, Chips, Cookies, Sweet Pastries, Store Bought Juices, Alcohol in Excess of 1-2 drinks a day, Artificial Sweeteners, Coffee Creamer, and "Sugar-free" Products. This will help patient to have stable blood glucose profile and potentially avoid unintended weight gain.   - I encouraged the patient to switch to unprocessed or minimally processed complex starch and increased protein intake (animal or plant source), fruits, and  vegetables.   - Patient is advised to stick to a routine mealtimes to eat 3 meals a day and avoid unnecessary snacks (to snack only to correct hypoglycemia).  - he will be scheduled with Jearld Fenton, RDN, CDE for diabetes education.  - I have approached him with the following individualized plan to manage  his  diabetes and patient agrees:   -Despite his increased A1C, he is advised to continue Metformin 500 mg ER daily with breakfast and Rybelsus 7 mg po daily.  -he is encouraged to continue monitoring blood glucose once daily, before breakfast and to call the clinic if he has readings less than 70 or greater than 200 for 3 tests in a row.   - Specific targets for  A1c;  LDL, HDL,  and Triglycerides were discussed with the patient.  2) Blood Pressure /Hypertension:  his blood pressure is controlled to target.   he is advised to continue his current medications including Clonidine 0.1 mg po TID, Lasix 20 mg po twice daily, and Metoprolol 37.5 mg po daily.  3) Lipids/Hyperlipidemia:    Review of his recent lipid panel from 08/21/20 showed controlled  LDL at 80.  he  is advised to continue Pravachol 40 mg daily at bedtime.  Side effects and precautions discussed with him.  4)  Weight/Diet:  his Body mass index is 25.02 kg/m.  -  he is is not a candidate for major weight loss. I discussed with him the fact that loss of 5 - 10% of his  current body weight will have the most impact on his diabetes management.  Exercise, and detailed carbohydrates information provided  -  detailed on discharge instructions.  5) Hypothyroidism: Long-standing diagnosis for him.  He was recently advised to lower his dose of Levothyroxine to 50 mcg po daily before breakfast based on his labs by his PCP.  He is advised to continue for now.  Will recheck TFTs prior to next visit.    - The correct intake of thyroid hormone (Levothyroxine, Synthroid), is on empty stomach first thing in the morning, with water, separated by at least 30 minutes from breakfast and other medications,  and separated by more than 4 hours from calcium, iron, multivitamins, acid reflux medications (PPIs).  - This medication is a life-long medication and will be needed to correct thyroid hormone imbalances for the rest of your life.  The dose may change  from time to time, based on thyroid blood work.  - It is extremely important to be consistent taking this medication, near the same time each morning.  -AVOID TAKING PRODUCTS CONTAINING BIOTIN (commonly found in Hair, Skin, Nails vitamins) AS IT INTERFERES WITH THE VALIDITY OF THYROID FUNCTION BLOOD TESTS.  6) Chronic Care/Health Maintenance: -he is on ACEI/ARB and Statin medications and is encouraged to initiate and continue to follow up with Ophthalmology, Dentist, Podiatrist at least yearly or according to recommendations, and advised to [place]. I have recommended yearly flu vaccine and pneumonia vaccine at least every 5 years; moderate intensity exercise for up to 150 minutes weekly; and sleep for at least 7 hours a day.  - he is advised to maintain close follow up with [name], FNP for primary care needs, as well as his other providers for optimal and coordinated care.     I spent 35 minutes in the care of the patient today including review of labs from [place], Lipids, Thyroid Function, Hematology (current and previous including abstractions from other facilities);  face-to-face time discussing  his blood glucose readings/logs, discussing hypoglycemia and hyperglycemia episodes and symptoms, medications doses, his options of short and long term treatment based on the latest standards of care / guidelines;  discussion about incorporating lifestyle medicine;  and documenting the encounter.    Please refer to Patient Instructions for Blood Glucose Monitoring and Insulin/Medications Dosing Guide"  in media tab for additional information. Please  also refer to " Patient Self Inventory" in the Media  tab for reviewed elements of pertinent patient history.  Frederica Kuster participated in the discussions, expressed understanding, and voiced agreement with the above plans.  All questions were answered to his satisfaction. he is encouraged to contact clinic should he have any questions or  concerns prior to his return visit.   Follow up plan: - Return in about 4 months (around 01/24/2021) for Diabetes F/U with A1c in office, Thyroid follow up, Previsit labs, Bring meter and logs.  Rayetta Pigg, [facility] [address] [place], [place] [address] Phone: [phone] Fax: [phone]  09/24/2020, 3:15 PM

## 2020-09-24 NOTE — Patient Instructions (Signed)
Diabetes Mellitus and Nutrition, Adult When you have diabetes, or diabetes mellitus, it is very important to have healthy eating habits because your blood sugar (glucose) levels are greatly affected by what you eat and drink. Eating healthy foods in the right amounts, at about the same times every day, can help you:  Control your blood glucose.  Lower your risk of heart disease.  Improve your blood pressure.  Reach or maintain a healthy weight. What can affect my meal plan? Every person with diabetes is different, and each person has different needs for a meal plan. Your health care provider may recommend that you work with a dietitian to make a meal plan that is best for you. Your meal plan may vary depending on factors such as:  The calories you need.  The medicines you take.  Your weight.  Your blood glucose, blood pressure, and cholesterol levels.  Your activity level.  Other health conditions you have, such as heart or kidney disease. How do carbohydrates affect me? Carbohydrates, also called carbs, affect your blood glucose level more than any other type of food. Eating carbs naturally raises the amount of glucose in your blood. Carb counting is a method for keeping track of how many carbs you eat. Counting carbs is important to keep your blood glucose at a healthy level, especially if you use insulin or take certain oral diabetes medicines. It is important to know how many carbs you can safely have in each meal. This is different for every person. Your dietitian can help you calculate how many carbs you should have at each meal and for each snack. How does alcohol affect me? Alcohol can cause a sudden decrease in blood glucose (hypoglycemia), especially if you use insulin or take certain oral diabetes medicines. Hypoglycemia can be a life-threatening condition. Symptoms of hypoglycemia, such as sleepiness, dizziness, and confusion, are similar to symptoms of having too much  alcohol.  Do not drink alcohol if: ? Your health care provider tells you not to drink. ? You are pregnant, may be pregnant, or are planning to become pregnant.  If you drink alcohol: ? Do not drink on an empty stomach. ? Limit how much you use to:  0-1 drink a day for women.  0-2 drinks a day for men. ? Be aware of how much alcohol is in your drink. In the [place] one drink equals one 12 oz bottle of beer (355 mL), one 5 oz glass of wine (148 mL), or one 1 oz glass of hard liquor (44 mL). ? Keep yourself hydrated with water, diet soda, or unsweetened iced tea.  Keep in mind that regular soda, juice, and other mixers may contain a lot of sugar and must be counted as carbs. What are tips for following this plan? Reading food labels  Start by checking the serving size on the "Nutrition Facts" label of packaged foods and drinks. The amount of calories, carbs, fats, and other nutrients listed on the label is based on one serving of the item. Many items contain more than one serving per package.  Check the total grams (g) of carbs in one serving. You can calculate the number of servings of carbs in one serving by dividing the total carbs by 15. For example, if a food has 30 g of total carbs per serving, it would be equal to 2 servings of carbs.  Check the number of grams (g) of saturated fats and trans fats in one serving. Choose foods that have   a low amount or none of these fats.  Check the number of milligrams (mg) of salt (sodium) in one serving. Most people should limit total sodium intake to less than 2,300 mg per day.  Always check the nutrition information of foods labeled as "low-fat" or "nonfat." These foods may be higher in added sugar or refined carbs and should be avoided.  Talk to your dietitian to identify your daily goals for nutrients listed on the label. Shopping  Avoid buying canned, pre-made, or processed foods. These foods tend to be high in fat, sodium, and added  sugar.  Shop around the outside edge of the grocery store. This is where you will most often find fresh fruits and vegetables, bulk grains, fresh meats, and fresh dairy. Cooking  Use low-heat cooking methods, such as baking, instead of high-heat cooking methods like deep frying.  Cook using healthy oils, such as olive, canola, or sunflower oil.  Avoid cooking with butter, cream, or high-fat meats. Meal planning  Eat meals and snacks regularly, preferably at the same times every day. Avoid going long periods of time without eating.  Eat foods that are high in fiber, such as fresh fruits, vegetables, beans, and whole grains. Talk with your dietitian about how many servings of carbs you can eat at each meal.  Eat 4-6 oz (112-168 g) of lean protein each day, such as lean meat, chicken, fish, eggs, or tofu. One ounce (oz) of lean protein is equal to: ? 1 oz (28 g) of meat, chicken, or fish. ? 1 egg. ?  cup (62 g) of tofu.  Eat some foods each day that contain healthy fats, such as avocado, nuts, seeds, and fish.   What foods should I eat? Fruits Berries. Apples. Oranges. Peaches. Apricots. Plums. Grapes. Mango. Papaya. Pomegranate. Kiwi. Cherries. Vegetables Lettuce. Spinach. Leafy greens, including kale, chard, collard greens, and mustard greens. Beets. Cauliflower. Cabbage. Broccoli. Carrots. Green beans. Tomatoes. Peppers. Onions. Cucumbers. Brussels sprouts. Grains Whole grains, such as whole-wheat or whole-grain bread, crackers, tortillas, cereal, and pasta. Unsweetened oatmeal. Quinoa. Brown or wild rice. Meats and other proteins Seafood. Poultry without skin. Lean cuts of poultry and beef. Tofu. Nuts. Seeds. Dairy Low-fat or fat-free dairy products such as milk, yogurt, and cheese. The items listed above may not be a complete list of foods and beverages you can eat. Contact a dietitian for more information. What foods should I avoid? Fruits Fruits canned with  syrup. Vegetables Canned vegetables. Frozen vegetables with butter or cream sauce. Grains Refined white flour and flour products such as bread, pasta, snack foods, and cereals. Avoid all processed foods. Meats and other proteins Fatty cuts of meat. Poultry with skin. Breaded or fried meats. Processed meat. Avoid saturated fats. Dairy Full-fat yogurt, cheese, or milk. Beverages Sweetened drinks, such as soda or iced tea. The items listed above may not be a complete list of foods and beverages you should avoid. Contact a dietitian for more information. Questions to ask a health care provider  Do I need to meet with a diabetes educator?  Do I need to meet with a dietitian?  What number can I call if I have questions?  When are the best times to check my blood glucose? Where to find more information:  American Diabetes Association: diabetes.org  Academy of Nutrition and Dietetics: www.eatright.org  National Institute of Diabetes and Digestive and Kidney Diseases: www.niddk.nih.gov  Association of Diabetes Care and Education Specialists: www.diabeteseducator.org Summary  It is important to have healthy eating   habits because your blood sugar (glucose) levels are greatly affected by what you eat and drink.  A healthy meal plan will help you control your blood glucose and maintain a healthy lifestyle.  Your health care provider may recommend that you work with a dietitian to make a meal plan that is best for you.  Keep in mind that carbohydrates (carbs) and alcohol have immediate effects on your blood glucose levels. It is important to count carbs and to use alcohol carefully. This information is not intended to replace advice given to you by your health care provider. Make sure you discuss any questions you have with your health care provider. Document Revised: 03/01/2019 Document Reviewed: 03/01/2019 [employer] Patient Education  2021 [employer]  

## 2020-09-25 ENCOUNTER — Ambulatory Visit (INDEPENDENT_AMBULATORY_CARE_PROVIDER_SITE_OTHER): Payer: Medicare Other | Admitting: Physician Assistant

## 2020-09-25 ENCOUNTER — Telehealth: Payer: Medicare Other | Admitting: *Deleted

## 2020-09-25 VITALS — BP 113/72 | HR 92 | Temp 97.9°F | Resp 18 | Ht 66.0 in | Wt 157.8 lb

## 2020-09-25 DIAGNOSIS — I739 Peripheral vascular disease, unspecified: Secondary | ICD-10-CM

## 2020-09-25 DIAGNOSIS — L97521 Non-pressure chronic ulcer of other part of left foot limited to breakdown of skin: Secondary | ICD-10-CM | POA: Diagnosis not present

## 2020-09-25 NOTE — Progress Notes (Signed)
POST OPERATIVE OFFICE NOTE    CC:  F/u for surgery  HPI:  John [name] is a 75 y.o. male with multiple medical comorbidities that presents for right groin check.  He is well-known and recently underwent right common femoral endarterectomy with profundoplasty and bovine patch angioplasty with a right common iliac stent on Aug 20, 2020 for short distance lifestyle limiting claudication.   He is here today for right groin incisional check.  He denise drainage, fever or chills.  He has been washing with soap and water daily and keeping a dry pad over the incision.    Allergies  Allergen Reactions   Jardiance [Empagliflozin] Other (See Comments)    FEELS SLUGGISH, TIRED   Lopressor [Metoprolol] Other (See Comments)    Fatigue   Atorvastatin     Muscle aches - tolerating Pravastatin 40 mg MWF   Rosuvastatin     Muscle aches - tolerating Pravastatin 40 mg MWF   Sulfa Antibiotics Swelling    Mouth swelling    Current Outpatient Medications  Medication Sig Dispense Refill   albuterol (VENTOLIN HFA) 108 (90 Base) MCG/ACT inhaler Inhale 2 puffs into the lungs every 4 (four) hours as needed for wheezing or shortness of breath. 8 g 6   apixaban (ELIQUIS) 5 MG TABS tablet Take 1 tablet (5 mg total) by mouth 2 (two) times daily. 60 tablet 3   Blood Glucose Monitoring Suppl (ONETOUCH VERIO REFLECT) w/Device KIT Use to test blood sugars daily as directed. DX: E11.9 1 kit 1   cloNIDine (CATAPRES) 0.1 MG tablet TAKE 1 TABLET (0.1 MG TOTAL) BY MOUTH 3 (THREE) TIMES DAILY. 270 tablet 0   dutasteride (AVODART) 0.5 MG capsule TAKE 1 CAPSULE BY MOUTH EVERY DAY (Patient taking differently: No sig reported) 90 capsule 1   fluticasone (FLONASE) 50 MCG/ACT nasal spray Place 1 spray into both nostrils daily as needed for allergies or rhinitis. (Patient taking differently: Place 1 spray into both nostrils daily.) 18.2 mL 2   gabapentin (NEURONTIN) 300 MG capsule TAKE 1 CAPSULE BY MOUTH THREE TIMES A DAY  (Patient taking differently: Take 300 mg by mouth 3 (three) times daily.) 270 capsule 0   glucose blood (ONETOUCH VERIO) test strip Use to test blood sugars daily as directed. DX: E11.9 100 each 12   Iron, Ferrous Sulfate, 325 (65 Fe) MG TABS Take 325 mg by mouth 2 (two) times daily. 60 tablet 0   levothyroxine (SYNTHROID) 50 MCG tablet Take 1 tablet (50 mcg total) by mouth daily. 30 tablet 2   linaclotide (LINZESS) 145 MCG CAPS capsule Take 1 capsule (145 mcg total) by mouth daily. To regulate bowel movements 30 capsule 5   losartan (COZAAR) 50 MG tablet Take 0.5 tablets (25 mg total) by mouth daily. 45 tablet 3   metFORMIN (GLUCOPHAGE-XR) 500 MG 24 hr tablet Take 1 tablet (500 mg total) by mouth daily with breakfast. 90 tablet 1   nicotine (NICODERM CQ - DOSED IN MG/24 HOURS) 14 mg/24hr patch Place 1 patch (14 mg total) onto the skin daily. 28 patch 0   nystatin (MYCOSTATIN) 100000 UNIT/ML suspension Take 5 mLs (500,000 Units total) by mouth 4 (four) times daily. 473 mL 1   ondansetron (ZOFRAN-ODT) 4 MG disintegrating tablet TAKE 1 TABLET BY MOUTH EVERY 8 HOURS AS NEEDED FOR NAUSEA AND VOMITING 20 tablet 0   OneTouch Delica Lancets 42A MISC Use to test blood sugars daily as directed. DX: E11.9 100 each 1   potassium chloride SA (KLOR-CON)  20 MEQ tablet Take 1 tablet (20 mEq total) by mouth daily. 90 tablet 0   pravastatin (PRAVACHOL) 40 MG tablet TAKE 1 TABLET (40 MG TOTAL) BY MOUTH ON MONDAYS , WEDNESDAYS, AND FRIDAYS AS DIRECTED 48 tablet 3   Semaglutide (RYBELSUS) 7 MG TABS Take 7 mg by mouth daily. 90 tablet 1   temazepam (RESTORIL) 7.5 MG capsule Take 1 capsule (7.5 mg total) by mouth at bedtime as needed for sleep. 30 capsule 1   torsemide 40 MG TABS Take 40 mg by mouth daily. 180 tablet 3   No current facility-administered medications for this visit.     ROS:  See HPI  Physical Exam:     Incision:  without drainage or erythema Extremities:  brisk doppler signals B  DP/PT/Peroneal.    Assessment/Plan:  This is a 75 y.o. male who is s/p:right common femoral endarterectomy with profundoplasty and bovine patch angioplasty with a right common iliac stent on Aug 20, 2020 for short distance lifestyle limiting claudication.    No signs of infection noted.  He will continue with soap and water dry ABD pad as needed for comfort.  F/U in 4 weeks for incision check. And 6 months for ABI.     Roxy Horseman PA-C [facility] [phone]   Clinic MD:  Carlis Abbott

## 2020-09-26 ENCOUNTER — Other Ambulatory Visit: Payer: Self-pay

## 2020-09-26 ENCOUNTER — Other Ambulatory Visit: Payer: Self-pay | Admitting: Family

## 2020-09-26 ENCOUNTER — Encounter: Payer: Self-pay | Admitting: Physician Assistant

## 2020-09-26 ENCOUNTER — Ambulatory Visit (INDEPENDENT_AMBULATORY_CARE_PROVIDER_SITE_OTHER): Payer: Medicare Other | Admitting: Physician Assistant

## 2020-09-26 VITALS — BP 105/60 | HR 84 | Temp 97.0°F | Ht 66.0 in | Wt 156.5 lb

## 2020-09-26 DIAGNOSIS — I2510 Atherosclerotic heart disease of native coronary artery without angina pectoris: Secondary | ICD-10-CM

## 2020-09-26 DIAGNOSIS — I739 Peripheral vascular disease, unspecified: Secondary | ICD-10-CM

## 2020-09-26 DIAGNOSIS — M25511 Pain in right shoulder: Secondary | ICD-10-CM

## 2020-09-26 MED ORDER — MELOXICAM 15 MG PO TABS: 15.0000 mg | ORAL_TABLET | Freq: Every day | ORAL | 0 refills | Status: DC

## 2020-09-26 NOTE — Patient Instructions (Signed)
Rotator Cuff Tear  A rotator cuff tear is a partial or complete tear of the cord-like bands (tendons) that connect muscle to bone in the rotator cuff. The rotator cuff is a group of muscles and tendons that surround the shoulder joint and keep the upper arm bone (humerus) in the shoulder socket. The tear can occur suddenly (acute tear) or can develop over a long period of time (chronic tear). What are the causes? Acute tears may be caused by: A fall, especially on an outstretched arm. Lifting very heavy objects with a jerking motion. Chronic tears may be caused by overuse of the muscles. This may happen in sports, physical work, or activities in which your arm repeatedly moves overyour head. What increases the risk? This condition is more likely to occur in: Athletes and workers who frequently use their shoulder or reach over their head. This may include activities such as: Tennis. Baseball and softball. Swimming and rowing. Weight lifting. Construction work. Painting. People who smoke. Older people who have arthritis or poor blood supply. These can make the muscles and tendons weaker. What are the signs or symptoms? Symptoms of this condition depend on the type and severity of the injury: An acute tear may include a sudden tearing feeling, followed by severe pain that goes from your upper shoulder, down your arm, and toward your elbow. A chronic tear includes a gradual weakness and decreased shoulder motion as the pain gets worse. The pain is usually worse at night. Both types may have symptoms such as: Pain that spreads (radiates) from the shoulder to the upper arm. Swelling and tenderness in front of the shoulder. Decreased range of motion. Pain when: Reaching, pulling, or lifting the arm above the head. Lowering the arm from above the head. Not being able to raise your arm out to the side. Difficulty placing the arm behind your back. How is this diagnosed? This condition is  diagnosed with a medical history and physical exam. Imaging tests may also be done, including: X-rays. MRI. Ultrasound. CT or MR arthrogram. During this test, a contrast material is injected into your shoulder, and then images are taken. How is this treated? Treatment for this condition depends on the type and severity of the condition. In less severe cases, treatment may include: Rest. This may be done with a sling that holds the shoulder still (immobilization). Your health care provider may also recommend avoiding activities that involve lifting your arm over your head. Icing the shoulder. Anti-inflammatory medicines, such as aspirin or ibuprofen. Strengthening and stretching exercises. Your health care provider may recommend specific exercises to improve your range of motion and strengthen your shoulder. In more severe cases, treatment may include: Physical therapy. Steroid injections. Surgery. Follow these instructions at home: Managing pain, stiffness, and swelling     If directed, put ice on the injured area. To do this: If you have a removable sling, remove it as told by your health care provider. Put ice in a plastic bag. Place a towel between your skin and the bag. Leave the ice on for 20 minutes, 2-3 times a day. Remove the ice if your skin turns bright red. This is very important. If you cannot feel pain, heat, or cold, you have a greater risk of damage to the area. Raise (elevate) the injured area above the level of your heart while you are lying down. Find a comfortable sleeping position or sleep on a recliner, if available. Move your fingers often to reduce stiffness and swelling.  Once the swelling has gone down, your health care provider may direct you to apply heat to relax the muscles. Use the heat source that your health care provider recommends, such as a moist heat pack or a heating pad. Place a towel between your skin and the heat source. Leave the heat on for  20-30 minutes. Remove the heat if your skin turns bright red. This is especially important if you are unable to feel pain, heat, or cold. You have a greater risk of getting burned. If you have a sling: Wear the sling as told by your health care provider. Remove it only as told by your health care provider. Loosen the sling if your fingers tingle, become numb, or turn cold and blue. Keep the sling clean. If the sling is not waterproof: Do not let it get wet. Cover it with a watertight covering when you take a bath or a shower. Activity Rest your shoulder as told by your health care provider. Return to your normal activities as told by your health care provider. Ask your health care provider what activities are safe for you. Ask your health care provider when it is safe to drive if you have a sling on your arm. Do any exercises or stretches as told by your health care provider. General instructions Take over-the-counter and prescription medicines only as told by your health care provider. Do not use any products that contain nicotine or tobacco, such as cigarettes, e-cigarettes, and chewing tobacco. If you need help quitting, ask your health care provider. Keep all follow-up visits. This is important. Contact a health care provider if: Your pain gets worse. You have new pain in your arm, hands, or fingers. Medicine does not help your pain. Get help right away if: Your arm, hand, or fingers are numb or tingling. Your arm, hand, or fingers are swollen or painful or they turn white or blue. Your hand or fingers on your injured arm are colder than your other hand. Summary A rotator cuff tear is a partial or complete tear of the cord-like bands (tendons) that connect muscle to bone in the rotator cuff. The tear can occur suddenly (acute tear) or can develop over a long period of time (chronic tear). Treatment generally includes rest, anti-inflammatory medicines, and icing. In some cases,  physical therapy and steroid injections may be needed. In severe cases, surgery may be needed. This information is not intended to replace advice given to you by your health care provider. Make sure you discuss any questions you have with your healthcare provider. Document Revised: 07/27/2019 Document Reviewed: 07/27/2019 Elsevier Patient Education  2022 [employer].

## 2020-09-26 NOTE — Progress Notes (Signed)
  Subjective:     Patient ID: John [name], male   DOB: [birth year], 75 y.o.   MRN: [id number]  Neck Pain   Pt with R shoulder pain for around 2 weeks Denies any injury to the area Sx will run from the neck to the lateral shoulder area Denies any numbness He uses a cane with the R arm Using OTC topical meds that helps sx   Review of Systems  Constitutional: Negative.   Musculoskeletal:  Positive for arthralgias and neck pain. Negative for joint swelling and neck stiffness.      Objective:   Physical Exam Vitals and nursing note reviewed.  Constitutional:      General: He is not in acute distress.    Appearance: Normal appearance. He is not toxic-appearing.  Neurological:     Mental Status: He is alert.  + point tender with muscle spasm just medial to the upper scapula Generalized TTP of the R superior shoulder FROM of the wrist and elbow w/o sx Decrease in ROM of the shoulder due to sx Increase in sx with anterior/lateral abduction Good pulses/sensroy to the RUE Grip strength good     Assessment:     1. Acute pain of right shoulder        Plan:     Do not want to use steroids due to possibility of increase in BS Prev renal profile good so short course of Mobic Can continue with topical med Massage Discussed sx c/w rotator cuff injury INB discussed possible inj F/U prn

## 2020-09-27 ENCOUNTER — Telehealth: Payer: Self-pay | Admitting: Family

## 2020-09-27 ENCOUNTER — Telehealth: Payer: Self-pay

## 2020-09-27 DIAGNOSIS — I4892 Unspecified atrial flutter: Secondary | ICD-10-CM | POA: Diagnosis not present

## 2020-09-27 DIAGNOSIS — I509 Heart failure, unspecified: Secondary | ICD-10-CM | POA: Diagnosis not present

## 2020-09-27 DIAGNOSIS — Z48812 Encounter for surgical aftercare following surgery on the circulatory system: Secondary | ICD-10-CM | POA: Diagnosis not present

## 2020-09-27 DIAGNOSIS — I110 Hypertensive heart disease with heart failure: Secondary | ICD-10-CM | POA: Diagnosis not present

## 2020-09-27 DIAGNOSIS — E1151 Type 2 diabetes mellitus with diabetic peripheral angiopathy without gangrene: Secondary | ICD-10-CM | POA: Diagnosis not present

## 2020-09-27 DIAGNOSIS — Z95820 Peripheral vascular angioplasty status with implants and grafts: Secondary | ICD-10-CM | POA: Diagnosis not present

## 2020-09-27 NOTE — Telephone Encounter (Signed)
Pt states they took him off Meloxicam when he was in the hospital early June. He doesn't remember why (?GI bleed maybe) is it ok for him to take it or is there something else that would work?

## 2020-09-27 NOTE — Telephone Encounter (Signed)
Sheran Fava from [facility] calls today to request Santyl for patient's wound. Patient was seen in office on 09/25/20 and the wound was fairly superficial. Discussed with PA and CEF - patient will continue to wash with soap and water and cover with a dry dressing. No ointments at this time. RN aware.

## 2020-09-27 NOTE — Telephone Encounter (Signed)
Pt stated that meloxicam was called in after his appt with Dahlia Client but he was taken off of this medication while in the hospital and is unable to take. Please cancel and call in something else. Inform pt with done.

## 2020-10-01 ENCOUNTER — Telehealth: Payer: Self-pay | Admitting: Family

## 2020-10-01 ENCOUNTER — Other Ambulatory Visit: Payer: Self-pay | Admitting: Family

## 2020-10-01 DIAGNOSIS — E1142 Type 2 diabetes mellitus with diabetic polyneuropathy: Secondary | ICD-10-CM

## 2020-10-01 NOTE — Telephone Encounter (Signed)
PATIENT AWARE

## 2020-10-01 NOTE — Telephone Encounter (Signed)
There is mention of Gastric ulcer in his problem list. As a result. He should not take meloxicam or anything similar (OTC Aleve or ibuprofen.) He should see Kristi to determine what can be done for his pain.

## 2020-10-01 NOTE — Telephone Encounter (Signed)
Called patient, no answer, no option to leave message

## 2020-10-04 DIAGNOSIS — Z95820 Peripheral vascular angioplasty status with implants and grafts: Secondary | ICD-10-CM | POA: Diagnosis not present

## 2020-10-04 DIAGNOSIS — I4892 Unspecified atrial flutter: Secondary | ICD-10-CM | POA: Diagnosis not present

## 2020-10-04 DIAGNOSIS — E1151 Type 2 diabetes mellitus with diabetic peripheral angiopathy without gangrene: Secondary | ICD-10-CM | POA: Diagnosis not present

## 2020-10-04 DIAGNOSIS — I110 Hypertensive heart disease with heart failure: Secondary | ICD-10-CM | POA: Diagnosis not present

## 2020-10-04 DIAGNOSIS — Z48812 Encounter for surgical aftercare following surgery on the circulatory system: Secondary | ICD-10-CM | POA: Diagnosis not present

## 2020-10-04 DIAGNOSIS — I509 Heart failure, unspecified: Secondary | ICD-10-CM | POA: Diagnosis not present

## 2020-10-05 ENCOUNTER — Other Ambulatory Visit: Payer: Self-pay

## 2020-10-05 ENCOUNTER — Ambulatory Visit (HOSPITAL_COMMUNITY): Payer: Medicare Other | Admitting: Anesthesiology

## 2020-10-05 ENCOUNTER — Telehealth: Payer: Self-pay

## 2020-10-05 ENCOUNTER — Encounter (HOSPITAL_COMMUNITY)
Admission: RE | Disposition: A | Discharge status: Home / Self Care | Payer: Self-pay | Source: Home / Self Care | Attending: Cardiovascular Disease

## 2020-10-05 ENCOUNTER — Encounter (HOSPITAL_COMMUNITY): Payer: Self-pay | Admitting: Cardiovascular Disease

## 2020-10-05 ENCOUNTER — Ambulatory Visit (HOSPITAL_COMMUNITY)
Admission: RE | Admit: 2020-10-05 | Discharge: 2020-10-05 | Disposition: A | Discharge status: Home / Self Care | Payer: Medicare Other | Attending: Cardiovascular Disease | Admitting: Cardiovascular Disease

## 2020-10-05 DIAGNOSIS — F17210 Nicotine dependence, cigarettes, uncomplicated: Secondary | ICD-10-CM | POA: Diagnosis not present

## 2020-10-05 DIAGNOSIS — I110 Hypertensive heart disease with heart failure: Secondary | ICD-10-CM | POA: Diagnosis not present

## 2020-10-05 DIAGNOSIS — I483 Typical atrial flutter: Secondary | ICD-10-CM | POA: Insufficient documentation

## 2020-10-05 DIAGNOSIS — Z79899 Other long term (current) drug therapy: Secondary | ICD-10-CM | POA: Diagnosis not present

## 2020-10-05 DIAGNOSIS — I2510 Atherosclerotic heart disease of native coronary artery without angina pectoris: Secondary | ICD-10-CM | POA: Insufficient documentation

## 2020-10-05 DIAGNOSIS — I480 Paroxysmal atrial fibrillation: Secondary | ICD-10-CM | POA: Diagnosis not present

## 2020-10-05 DIAGNOSIS — Z7902 Long term (current) use of antithrombotics/antiplatelets: Secondary | ICD-10-CM | POA: Diagnosis not present

## 2020-10-05 DIAGNOSIS — Z7984 Long term (current) use of oral hypoglycemic drugs: Secondary | ICD-10-CM | POA: Insufficient documentation

## 2020-10-05 DIAGNOSIS — G4733 Obstructive sleep apnea (adult) (pediatric): Secondary | ICD-10-CM | POA: Diagnosis not present

## 2020-10-05 DIAGNOSIS — I498 Other specified cardiac arrhythmias: Secondary | ICD-10-CM | POA: Insufficient documentation

## 2020-10-05 DIAGNOSIS — Z7901 Long term (current) use of anticoagulants: Secondary | ICD-10-CM | POA: Diagnosis not present

## 2020-10-05 DIAGNOSIS — J449 Chronic obstructive pulmonary disease, unspecified: Secondary | ICD-10-CM | POA: Insufficient documentation

## 2020-10-05 DIAGNOSIS — K259 Gastric ulcer, unspecified as acute or chronic, without hemorrhage or perforation: Secondary | ICD-10-CM | POA: Insufficient documentation

## 2020-10-05 DIAGNOSIS — Z882 Allergy status to sulfonamides status: Secondary | ICD-10-CM | POA: Diagnosis not present

## 2020-10-05 DIAGNOSIS — Z7951 Long term (current) use of inhaled steroids: Secondary | ICD-10-CM | POA: Insufficient documentation

## 2020-10-05 DIAGNOSIS — I5032 Chronic diastolic (congestive) heart failure: Secondary | ICD-10-CM

## 2020-10-05 DIAGNOSIS — C3490 Malignant neoplasm of unspecified part of unspecified bronchus or lung: Secondary | ICD-10-CM | POA: Insufficient documentation

## 2020-10-05 DIAGNOSIS — I4892 Unspecified atrial flutter: Secondary | ICD-10-CM | POA: Diagnosis not present

## 2020-10-05 DIAGNOSIS — Z9989 Dependence on other enabling machines and devices: Secondary | ICD-10-CM | POA: Diagnosis not present

## 2020-10-05 DIAGNOSIS — Z85118 Personal history of other malignant neoplasm of bronchus and lung: Secondary | ICD-10-CM | POA: Diagnosis not present

## 2020-10-05 DIAGNOSIS — E1151 Type 2 diabetes mellitus with diabetic peripheral angiopathy without gangrene: Secondary | ICD-10-CM | POA: Insufficient documentation

## 2020-10-05 HISTORY — PX: CARDIOVERSION: SHX1299

## 2020-10-05 SURGERY — CARDIOVERSION
Anesthesia: General

## 2020-10-05 MED ORDER — SODIUM CHLORIDE 0.9 % IV SOLN: INTRAVENOUS | Status: DC | PRN

## 2020-10-05 MED ORDER — LIDOCAINE 2% (20 MG/ML) 5 ML SYRINGE
INTRAMUSCULAR | Status: DC | PRN
  Administered 2020-10-05: 40 mg via INTRAVENOUS

## 2020-10-05 MED ORDER — PROPOFOL 10 MG/ML IV BOLUS
INTRAVENOUS | Status: DC | PRN
  Administered 2020-10-05: 40 mg via INTRAVENOUS

## 2020-10-05 MED ORDER — TORSEMIDE 20 MG PO TABS: 40.0000 mg | ORAL_TABLET | Freq: Every day | ORAL | 3 refills | Status: DC

## 2020-10-05 NOTE — Transfer of Care (Signed)
Immediate Anesthesia Transfer of Care Note  Patient: John [name]  Procedure(s) Performed: CARDIOVERSION  Patient Location: Endoscopy Unit  Anesthesia Type:General  Level of Consciousness: oriented, drowsy and patient cooperative  Airway & Oxygen Therapy: Patient Spontanous Breathing and Patient connected to nasal cannula oxygen  Post-op Assessment: Report given to RN and Post -op Vital signs reviewed and stable  Post vital signs: Reviewed  Last Vitals:  Vitals Value Taken Time  BP    Temp    Pulse    Resp    SpO2      Last Pain:  Vitals:   10/05/20 0931  TempSrc: Temporal  PainSc: 0-No pain         Complications: No notable events documented.

## 2020-10-05 NOTE — Discharge Instructions (Addendum)
Electrical Cardioversion  Only Medication Change per Dr. Audie Box: Torsemide - Take 2 tablets (40mg  total) by mouth daily.  Take 40 mg twice daily for 3 days and then resume 40mg  daily.    Electrical cardioversion is the delivery of a jolt of electricity to restore a normal rhythm to the heart. A rhythm that is too fast or is not regular keeps the heart from pumping well. In this procedure, sticky patches or metal paddles are placed on the chest to deliver electricity to the heart from a device.  If this information does not answer your questions, please call [facility] office at [phone] to clarify.   Follow these instructions at home: You may have some redness on the skin where the shocks were given.  You may apply over-the-counter hydrocortisone cream or aloe vera to alleviate skin irritation. YOU SHOULD NOT DRIVE, use power tools, machinery or perform tasks that involve climbing or major physical exertion for 24 hours (because of the sedation medicines used during the test).  Take over-the-counter and prescription medicines only as told by your health care provider. Ask your health care provider how to check your pulse. Check it often. Rest for 48 hours after the procedure or as told by your health care provider. Avoid or limit your caffeine use as told by your health care provider. Keep all follow-up visits as told by your health care provider. This is important.  FOLLOW UP:  Please also call with any specific questions about appointments or follow up tests.

## 2020-10-05 NOTE — Anesthesia Procedure Notes (Signed)
Procedure Name: General with mask airway Date/Time: 10/05/2020 10:35 AM Performed by: Jenne Campus, CRNA Pre-anesthesia Checklist: Patient identified, Emergency Drugs available, Suction available and Patient being monitored Patient Re-evaluated:Patient Re-evaluated prior to induction Oxygen Delivery Method: Ambu bag

## 2020-10-05 NOTE — Telephone Encounter (Signed)
-----   Message from Geralynn Rile, MD sent at 10/05/2020  2:28 PM EDT ----- Regarding: BMP Next week He can in for cardioversion volume overloaded. I increased his torsemide to 40 mg BID. Needs a BMP in office next week. Tuesday or Wednesday.   Lake Bells T. Audie Box, MD, [facility]  [address], [place], [place] [address] [phone]  2:29 PM

## 2020-10-05 NOTE — Anesthesia Postprocedure Evaluation (Signed)
Anesthesia Post Note  Patient: John [name]  Procedure(s) Performed: CARDIOVERSION     Patient location during evaluation: Endoscopy Anesthesia Type: General Level of consciousness: awake and alert Pain management: pain level controlled Vital Signs Assessment: post-procedure vital signs reviewed and stable Respiratory status: spontaneous breathing, nonlabored ventilation, respiratory function stable and patient connected to nasal cannula oxygen Cardiovascular status: blood pressure returned to baseline and stable Postop Assessment: no apparent nausea or vomiting Anesthetic complications: no   No notable events documented.  Last Vitals:  Vitals:   10/05/20 1126 10/05/20 1136  BP: (!) 124/91 (!) 116/58  Pulse: 100 100  Resp: 10 14  Temp:    SpO2: 100% 97%    Last Pain:  Vitals:   10/05/20 1136  TempSrc:   PainSc: 0-No pain                 Kenichi Cassada L Clent Damore

## 2020-10-05 NOTE — Interval H&P Note (Signed)
History and Physical Interval Note:  10/05/2020 9:14 AM  Frederica Kuster  has presented today for surgery, with the diagnosis of A-FLUTTER.  The various methods of treatment have been discussed with the patient and family. After consideration of risks, benefits and other options for treatment, the patient has consented to  Procedure(s): CARDIOVERSION (N/A) as a surgical intervention.  The patient's history has been reviewed, patient examined, no change in status, stable for surgery.  I have reviewed the patient's chart and labs.  Questions were answered to the patient's satisfaction.     NPO for DCCV for atrial flutter. Volume up. Suspect flutter is contributing. Will need increased diuresis at home. On eliquis >3 weeks no missed doses.   Lake Bells T. Audie Box, MD, [facility]  [address], [place], [place] [address] [phone]  9:15 AM

## 2020-10-05 NOTE — Anesthesia Preprocedure Evaluation (Addendum)
Anesthesia Evaluation  Patient identified by MRN, date of birth, ID band Patient awake    Reviewed: Allergy & Precautions, NPO status , Patient's Chart, lab work & pertinent test results  Airway Mallampati: III  TM Distance: >3 FB Neck ROM: Full    Dental  (+) Edentulous Upper, Edentulous Lower, Lower Dentures, Upper Dentures   Pulmonary shortness of breath, asthma , sleep apnea , COPD, Current Smoker,  H/o right lung CA    Pulmonary exam normal breath sounds clear to auscultation       Cardiovascular hypertension, + Peripheral Vascular Disease and +CHF  Normal cardiovascular exam+ dysrhythmias Atrial Fibrillation + Valvular Problems/Murmurs AS  Rhythm:Irregular Rate:Normal + Systolic murmurs TTE 3825 1. Left ventricular ejection fraction, by estimation, is 60 to 65%. The  left ventricle has normal function. The left ventricle has no regional  wall motion abnormalities. Left ventricular diastolic parameters are  indeterminate.  2. Right ventricular systolic function is mildly reduced. The right  ventricular size is normal.  3. The mitral valve is abnormal. No evidence of mitral valve  regurgitation. The mean mitral valve gradient is 4.0 mmHg with average  heart rate of 87 bpm. Moderate mitral annular calcification.  4. The aortic valve is calcified. Aortic valve regurgitation is not  visualized. Mild to moderate aortic valve sclerosis/calcification is  present. Aortic valve area, by VTI measures 0.91 cm. Aortic valve mean  gradient measures 8.0 mmHg. Aortic valve  Vmax measures 1.88 m/s. Aortic valve acceleration time measures 97 msec.  There is discordance between the AVA and gradients; DVI is 0.36 with LV  stroke volume index of 30; Aortic stenosis may be underestimated in this  study. Consider outpatient  monitoring if clinically indicated.   Neuro/Psych negative neurological ROS  negative psych ROS   GI/Hepatic Neg  liver ROS, PUD,   Endo/Other  diabetes, Oral Hypoglycemic AgentsHypothyroidism   Renal/GU negative Renal ROS  negative genitourinary   Musculoskeletal  (+) Arthritis ,   Abdominal   Peds  Hematology  (+) Blood dyscrasia (on eliquis, Hgb 9.7), anemia ,   Anesthesia Other Findings   Reproductive/Obstetrics                            Anesthesia Physical Anesthesia Plan  ASA: 3  Anesthesia Plan: General   Post-op Pain Management:    Induction: Intravenous  PONV Risk Score and Plan: Propofol infusion and Treatment may vary due to age or medical condition  Airway Management Planned: Natural Airway  Additional Equipment:   Intra-op Plan:   Post-operative Plan:   Informed Consent: I have reviewed the patients History and Physical, chart, labs and discussed the procedure including the risks, benefits and alternatives for the proposed anesthesia with the patient or authorized representative who has indicated his/her understanding and acceptance.     Dental advisory given  Plan Discussed with: CRNA  Anesthesia Plan Comments:         Anesthesia Quick Evaluation

## 2020-10-05 NOTE — CV Procedure (Signed)
   DIRECT CURRENT CARDIOVERSION  NAME:  John [name]    MRN: [id number] DOB:  [birth year]    ADMIT DATE: 10/05/2020  Indication:  Symptomatic atrial flutter  Procedure Note:  The patient signed informed consent.  They have had had therapeutic anticoagulation with eliquis greater than 3 weeks.  Anesthesia was administered by Dr. Lanetta Inch.  Adequate airway was maintained throughout and vital followed per protocol.  They were cardioverted x 1 with 120J of biphasic synchronized energy.  They converted to NSR.  There were no apparent complications.  The patient had normal neuro status and respiratory status post procedure with vitals stable as recorded elsewhere.    Follow up: They will continue on current medical therapy and follow up with cardiology as scheduled.  Lake Bells T. Audie Box, MD, [facility]  [address] [place], [place] [address] [phone]  10:38 AM

## 2020-10-05 NOTE — Telephone Encounter (Signed)
Called patient, went over notes from MD. Ordered blood work for next week. Patient made aware.  No other questions or concerns.

## 2020-10-06 ENCOUNTER — Telehealth: Payer: Self-pay | Admitting: Physician Assistant

## 2020-10-06 DIAGNOSIS — Z95820 Peripheral vascular angioplasty status with implants and grafts: Secondary | ICD-10-CM | POA: Diagnosis not present

## 2020-10-06 DIAGNOSIS — H3530 Unspecified macular degeneration: Secondary | ICD-10-CM | POA: Diagnosis not present

## 2020-10-06 DIAGNOSIS — Z48812 Encounter for surgical aftercare following surgery on the circulatory system: Secondary | ICD-10-CM | POA: Diagnosis not present

## 2020-10-06 DIAGNOSIS — Z7901 Long term (current) use of anticoagulants: Secondary | ICD-10-CM | POA: Diagnosis not present

## 2020-10-06 DIAGNOSIS — E039 Hypothyroidism, unspecified: Secondary | ICD-10-CM | POA: Diagnosis not present

## 2020-10-06 DIAGNOSIS — N401 Enlarged prostate with lower urinary tract symptoms: Secondary | ICD-10-CM | POA: Diagnosis not present

## 2020-10-06 DIAGNOSIS — C3491 Malignant neoplasm of unspecified part of right bronchus or lung: Secondary | ICD-10-CM | POA: Diagnosis not present

## 2020-10-06 DIAGNOSIS — E1140 Type 2 diabetes mellitus with diabetic neuropathy, unspecified: Secondary | ICD-10-CM | POA: Diagnosis not present

## 2020-10-06 DIAGNOSIS — I110 Hypertensive heart disease with heart failure: Secondary | ICD-10-CM | POA: Diagnosis not present

## 2020-10-06 DIAGNOSIS — G4733 Obstructive sleep apnea (adult) (pediatric): Secondary | ICD-10-CM | POA: Diagnosis not present

## 2020-10-06 DIAGNOSIS — E1151 Type 2 diabetes mellitus with diabetic peripheral angiopathy without gangrene: Secondary | ICD-10-CM | POA: Diagnosis not present

## 2020-10-06 DIAGNOSIS — R338 Other retention of urine: Secondary | ICD-10-CM | POA: Diagnosis not present

## 2020-10-06 DIAGNOSIS — I2510 Atherosclerotic heart disease of native coronary artery without angina pectoris: Secondary | ICD-10-CM | POA: Diagnosis not present

## 2020-10-06 DIAGNOSIS — K219 Gastro-esophageal reflux disease without esophagitis: Secondary | ICD-10-CM | POA: Diagnosis not present

## 2020-10-06 DIAGNOSIS — F329 Major depressive disorder, single episode, unspecified: Secondary | ICD-10-CM | POA: Diagnosis not present

## 2020-10-06 DIAGNOSIS — Z7902 Long term (current) use of antithrombotics/antiplatelets: Secondary | ICD-10-CM | POA: Diagnosis not present

## 2020-10-06 DIAGNOSIS — Z7982 Long term (current) use of aspirin: Secondary | ICD-10-CM | POA: Diagnosis not present

## 2020-10-06 DIAGNOSIS — J449 Chronic obstructive pulmonary disease, unspecified: Secondary | ICD-10-CM | POA: Diagnosis not present

## 2020-10-06 DIAGNOSIS — E785 Hyperlipidemia, unspecified: Secondary | ICD-10-CM | POA: Diagnosis not present

## 2020-10-06 DIAGNOSIS — M1990 Unspecified osteoarthritis, unspecified site: Secondary | ICD-10-CM | POA: Diagnosis not present

## 2020-10-06 DIAGNOSIS — F419 Anxiety disorder, unspecified: Secondary | ICD-10-CM | POA: Diagnosis not present

## 2020-10-06 DIAGNOSIS — I509 Heart failure, unspecified: Secondary | ICD-10-CM | POA: Diagnosis not present

## 2020-10-06 DIAGNOSIS — F17210 Nicotine dependence, cigarettes, uncomplicated: Secondary | ICD-10-CM | POA: Diagnosis not present

## 2020-10-06 DIAGNOSIS — J9611 Chronic respiratory failure with hypoxia: Secondary | ICD-10-CM | POA: Diagnosis not present

## 2020-10-06 DIAGNOSIS — I4892 Unspecified atrial flutter: Secondary | ICD-10-CM | POA: Diagnosis not present

## 2020-10-06 NOTE — Telephone Encounter (Signed)
   The patient called the answering service after-hours today. Despite cardioversion and increase in torsemide he has been having worsening SOB, orthopnea overnight, and nausea today. He feels very weak. At this juncture would recommend return to ED for evaluation given long holiday weekend and the fact that he has already tried to increase torsemide (office not open again until 7/5). The patient verbalized understanding and gratitude. He is aware not to drive himself to ED. Will route to Dr. Marisue Ivan and Almyra Free as Juluis Rainier.  Charlie Pitter, PA-C

## 2020-10-08 ENCOUNTER — Telehealth: Payer: Self-pay | Admitting: Cardiology

## 2020-10-08 NOTE — Telephone Encounter (Signed)
Pt called about his BP was 107 after taking torsemide 40 BID  HR 108.  He was weak and dizzy.  Offered for him to come to ER but he plans to see his PCP tomorrow.  He will ask her to do BMP to check K+.  He will also ask her to send Dr. Audie Box a copy.  In mean time he still has lower ext edema.  We were concerned he may have gone back in to atrial fib after DCCV. Pt has refused to return to ER.  No SOB.  He will hold torsemide for now until labs are back.

## 2020-10-09 ENCOUNTER — Encounter (HOSPITAL_BASED_OUTPATIENT_CLINIC_OR_DEPARTMENT_OTHER): Payer: Self-pay | Admitting: Radiology

## 2020-10-09 ENCOUNTER — Other Ambulatory Visit: Payer: Self-pay

## 2020-10-09 ENCOUNTER — Emergency Department (HOSPITAL_BASED_OUTPATIENT_CLINIC_OR_DEPARTMENT_OTHER): Payer: Medicare Other | Admitting: Radiology

## 2020-10-09 ENCOUNTER — Telehealth: Payer: Self-pay | Admitting: Cardiovascular Disease

## 2020-10-09 ENCOUNTER — Observation Stay (HOSPITAL_BASED_OUTPATIENT_CLINIC_OR_DEPARTMENT_OTHER)
Admission: EM | Admit: 2020-10-09 | Discharge: 2020-10-10 | Disposition: A | Discharge status: Home / Self Care | Payer: Medicare Other | Attending: Student | Admitting: Student

## 2020-10-09 ENCOUNTER — Ambulatory Visit (INDEPENDENT_AMBULATORY_CARE_PROVIDER_SITE_OTHER): Payer: Medicare Other | Admitting: Nurse Practitioner

## 2020-10-09 ENCOUNTER — Emergency Department (HOSPITAL_BASED_OUTPATIENT_CLINIC_OR_DEPARTMENT_OTHER): Payer: Medicare Other

## 2020-10-09 ENCOUNTER — Encounter: Payer: Self-pay | Admitting: Nurse Practitioner

## 2020-10-09 VITALS — BP 113/53 | HR 107 | Temp 97.8°F | Ht 65.5 in | Wt 158.0 lb

## 2020-10-09 DIAGNOSIS — C3491 Malignant neoplasm of unspecified part of right bronchus or lung: Secondary | ICD-10-CM | POA: Diagnosis present

## 2020-10-09 DIAGNOSIS — D649 Anemia, unspecified: Secondary | ICD-10-CM | POA: Diagnosis present

## 2020-10-09 DIAGNOSIS — E1142 Type 2 diabetes mellitus with diabetic polyneuropathy: Secondary | ICD-10-CM

## 2020-10-09 DIAGNOSIS — R634 Abnormal weight loss: Secondary | ICD-10-CM | POA: Diagnosis not present

## 2020-10-09 DIAGNOSIS — R000 Tachycardia, unspecified: Secondary | ICD-10-CM | POA: Diagnosis not present

## 2020-10-09 DIAGNOSIS — Z7901 Long term (current) use of anticoagulants: Secondary | ICD-10-CM | POA: Insufficient documentation

## 2020-10-09 DIAGNOSIS — J449 Chronic obstructive pulmonary disease, unspecified: Secondary | ICD-10-CM | POA: Insufficient documentation

## 2020-10-09 DIAGNOSIS — Z8679 Personal history of other diseases of the circulatory system: Secondary | ICD-10-CM | POA: Diagnosis not present

## 2020-10-09 DIAGNOSIS — K5730 Diverticulosis of large intestine without perforation or abscess without bleeding: Secondary | ICD-10-CM | POA: Diagnosis not present

## 2020-10-09 DIAGNOSIS — E876 Hypokalemia: Secondary | ICD-10-CM | POA: Diagnosis not present

## 2020-10-09 DIAGNOSIS — Z7984 Long term (current) use of oral hypoglycemic drugs: Secondary | ICD-10-CM | POA: Diagnosis not present

## 2020-10-09 DIAGNOSIS — R2243 Localized swelling, mass and lump, lower limb, bilateral: Secondary | ICD-10-CM | POA: Diagnosis not present

## 2020-10-09 DIAGNOSIS — E039 Hypothyroidism, unspecified: Secondary | ICD-10-CM | POA: Insufficient documentation

## 2020-10-09 DIAGNOSIS — Z79899 Other long term (current) drug therapy: Secondary | ICD-10-CM | POA: Diagnosis not present

## 2020-10-09 DIAGNOSIS — D509 Iron deficiency anemia, unspecified: Principal | ICD-10-CM | POA: Insufficient documentation

## 2020-10-09 DIAGNOSIS — E1165 Type 2 diabetes mellitus with hyperglycemia: Secondary | ICD-10-CM

## 2020-10-09 DIAGNOSIS — Z20822 Contact with and (suspected) exposure to covid-19: Secondary | ICD-10-CM | POA: Diagnosis not present

## 2020-10-09 DIAGNOSIS — R600 Localized edema: Secondary | ICD-10-CM | POA: Diagnosis not present

## 2020-10-09 DIAGNOSIS — I5032 Chronic diastolic (congestive) heart failure: Secondary | ICD-10-CM | POA: Diagnosis not present

## 2020-10-09 DIAGNOSIS — I739 Peripheral vascular disease, unspecified: Secondary | ICD-10-CM | POA: Diagnosis present

## 2020-10-09 DIAGNOSIS — I480 Paroxysmal atrial fibrillation: Secondary | ICD-10-CM | POA: Insufficient documentation

## 2020-10-09 DIAGNOSIS — R5383 Other fatigue: Secondary | ICD-10-CM | POA: Diagnosis present

## 2020-10-09 DIAGNOSIS — I509 Heart failure, unspecified: Secondary | ICD-10-CM | POA: Diagnosis not present

## 2020-10-09 DIAGNOSIS — E119 Type 2 diabetes mellitus without complications: Secondary | ICD-10-CM | POA: Diagnosis not present

## 2020-10-09 DIAGNOSIS — I5033 Acute on chronic diastolic (congestive) heart failure: Secondary | ICD-10-CM | POA: Diagnosis present

## 2020-10-09 DIAGNOSIS — F17210 Nicotine dependence, cigarettes, uncomplicated: Secondary | ICD-10-CM | POA: Insufficient documentation

## 2020-10-09 DIAGNOSIS — Z85118 Personal history of other malignant neoplasm of bronchus and lung: Secondary | ICD-10-CM | POA: Diagnosis not present

## 2020-10-09 DIAGNOSIS — I4892 Unspecified atrial flutter: Secondary | ICD-10-CM | POA: Diagnosis present

## 2020-10-09 DIAGNOSIS — R778 Other specified abnormalities of plasma proteins: Secondary | ICD-10-CM | POA: Diagnosis not present

## 2020-10-09 DIAGNOSIS — R609 Edema, unspecified: Secondary | ICD-10-CM

## 2020-10-09 DIAGNOSIS — R531 Weakness: Secondary | ICD-10-CM | POA: Insufficient documentation

## 2020-10-09 DIAGNOSIS — J439 Emphysema, unspecified: Secondary | ICD-10-CM | POA: Diagnosis not present

## 2020-10-09 DIAGNOSIS — I110 Hypertensive heart disease with heart failure: Secondary | ICD-10-CM | POA: Insufficient documentation

## 2020-10-09 DIAGNOSIS — J45909 Unspecified asthma, uncomplicated: Secondary | ICD-10-CM | POA: Insufficient documentation

## 2020-10-09 LAB — COMPREHENSIVE METABOLIC PANEL
ALT: 10 U/L (ref 0–44)
AST: 15 U/L (ref 15–41)
Albumin: 3.4 g/dL — ABNORMAL LOW (ref 3.5–5.0)
Alkaline Phosphatase: 111 U/L (ref 38–126)
Anion gap: 10 (ref 5–15)
BUN: 19 mg/dL (ref 8–23)
CO2: 31 mmol/L (ref 22–32)
Calcium: 9.3 mg/dL (ref 8.9–10.3)
Chloride: 97 mmol/L — ABNORMAL LOW (ref 98–111)
Creatinine, Ser: 0.74 mg/dL (ref 0.61–1.24)
GFR, Estimated: >60 mL/min (ref >60)
Glucose, Bld: 133 mg/dL — ABNORMAL HIGH (ref 70–99)
Potassium: 3.5 mmol/L (ref 3.5–5.1)
Sodium: 138 mmol/L (ref 135–145)
Total Bilirubin: 0.4 mg/dL (ref 0.3–1.2)
Total Protein: 6.5 g/dL (ref 6.5–8.1)

## 2020-10-09 LAB — CBC WITH DIFFERENTIAL/PLATELET
Band Neutrophils: 0 %
Basophils Absolute: 0 10*3/uL (ref 0.0–0.1)
Basophils Relative: 0 %
Eosinophils Absolute: 0.1 10*3/uL (ref 0.0–0.5)
Eosinophils Relative: 2 %
HCT: 22.5 % — ABNORMAL LOW (ref 39.0–52.0)
Hemoglobin: 6.3 g/dL — CL (ref 13.0–17.0)
Lymphocytes Relative: 9 %
Lymphs Abs: 0.6 10*3/uL — ABNORMAL LOW (ref 0.7–4.0)
MCH: 24.8 pg — ABNORMAL LOW (ref 26.0–34.0)
MCHC: 27.6 g/dL — ABNORMAL LOW (ref 30.0–36.0)
MCV: 90 fL (ref 80.0–100.0)
Monocytes Absolute: 1.1 10*3/uL — ABNORMAL HIGH (ref 0.1–1.0)
Monocytes Relative: 15 %
Neutro Abs: 5.4 10*3/uL (ref 1.7–7.7)
Neutrophils Relative %: 74 %
Platelets: 353 10*3/uL (ref 150–400)
RBC: 2.5 MIL/uL — ABNORMAL LOW (ref 4.22–5.81)
RDW: 74.4 % — ABNORMAL HIGH (ref 11.5–15.5)
WBC: 7.4 10*3/uL (ref 4.0–10.5)

## 2020-10-09 LAB — TROPONIN I (HIGH SENSITIVITY): Troponin I (High Sensitivity): 62 ng/L — ABNORMAL HIGH (ref <18)

## 2020-10-09 LAB — BRAIN NATRIURETIC PEPTIDE: B Natriuretic Peptide: 358 pg/mL — ABNORMAL HIGH (ref 0.0–100.0)

## 2020-10-09 LAB — OCCULT BLOOD X 1 CARD TO LAB, STOOL: Fecal Occult Bld: NEGATIVE

## 2020-10-09 MED ORDER — IOHEXOL 300 MG/ML  SOLN
100.0000 mL | Freq: Once | INTRAMUSCULAR | Status: AC | PRN
  Administered 2020-10-09: 100 mL via INTRAVENOUS

## 2020-10-09 NOTE — ED Notes (Signed)
Pt has been hooked to 2 [place]. Pt has hx OSA and wears 2L at home as needed. Pt's SP02 was dropping when he is sleeping.

## 2020-10-09 NOTE — Assessment & Plan Note (Signed)
Patient is in clinic today for fatigue, bilateral lower extremity edema and increased weakness.  Patient is pale looking and moving very slowly.  Bilateral lower extremity edema +1.  Has elevated pulse with decreased blood pressure.  Completed CMP and EKG, results will be faxed to Dr  Audie Box.  Patient did not want to wait in the emergency department day for he is not going to the hospital until he hears from Dr. Audie Box. Education provided to patient with printed handouts given.  Follow-up with worsening or unresolved symptoms.

## 2020-10-09 NOTE — Telephone Encounter (Signed)
Called patient, he will go ahead and go to the ED.

## 2020-10-09 NOTE — ED Notes (Signed)
ED Provider at bedside. Dr.  Apolonio Schneiders did a rectal exam

## 2020-10-09 NOTE — ED Triage Notes (Signed)
Patient reports he recently had a cardioversion and since he had that done he has been having leg swelling and fatigue. Patient reports he has had low blood pressure as well

## 2020-10-09 NOTE — Telephone Encounter (Signed)
Pt c/o medication issue:  1. Name of Medication: torsemide 40 MG TABS  2. How are you currently taking this medication (dosage and times per day)? 2x per day  3. Are you having a reaction (difficulty breathing--STAT)? No  4. What is your medication issue? Lightheaded, nauseated yesterday, itching and rash on arms and back Pt states he is not nauseated today because he have not taken the meds today. Pt has an appt today 1030 at [facility] for blood work

## 2020-10-09 NOTE — Telephone Encounter (Signed)
I contacted patient, he states that he is having itching, rash on his arms and back, he states that he has been lightheaded and just not feeling well. He is not sure if its related to the medications or not- but the only change was the increase that was suggested for 3 days. Patient is seeing his primary care doctor today and will ask they do blood work and have it sent to [place]. I did advise patient to wait for his medication until he seen primary care for them to review the rash and itching. He will also assess for any weight changes. He will notify us of any changes or suggestions from PCP visit.   Will route to MD to make aware.

## 2020-10-09 NOTE — ED Provider Notes (Signed)
[facility] EMERGENCY DEPT Provider Note   CSN: [id number] Arrival date & time: 10/09/20  1836     History Chief Complaint  Patient presents with   Leg Swelling    John [name] is a 75 y.o. male.  75 year old male with extensive past medical history below including non-small cell lung cancer, CHF, COPD, type 2 diabetes mellitus, atrial fibrillation on anticoagulation, OSA who presents with leg swelling and fatigue.  4 days ago, patient had a cardioversion for atrial fibrillation and was discharged.  Since then, he states he has been taking his medications but he has had increased bilateral lower extremity edema.  He has also had generalized fatigue and wife notes that he has been getting winded and tired when doing any exertional activity.  He endorses lightheadedness particularly with activity.  No syncope.  He denies any chest pain.  He has a chronic cough that is unchanged recently.  He has some dark stools but states that he thinks this is from taking iron. He notes his BP was 113/50s this morning which he thought was low. His heart rate has also been higher at home.  The history is provided by the patient and the spouse.      Past Medical History:  Diagnosis Date   Adenocarcinoma of lung, right ([place]) 04/18/2016   Anxiety    Arthritis    Asthma    BPH (benign prostatic hyperplasia)    with urinary retention 02/06/20   CHF (congestive heart failure) (HCC)    COPD (chronic obstructive pulmonary disease) (HCC)    Depression    Diabetes mellitus without complication ([place])    no meds   Diabetes mellitus, type II ([place])    DM (diabetes mellitus) ([place]) 07/09/2016   Dyspnea    History of kidney stones    History of radiation therapy 06/25/16-08/20/16   right lung   Hyperlipidemia    Hypertension    Hypertension    Hypothyroidism    Macular degeneration    Neuropathy    Non-small cell lung cancer, right ([place]) 04/18/2016   Peripheral vascular disease ([place])     Prostatitis    Pulmonary nodule, left 07/16/2016   Sleep apnea    cpap    Patient Active Problem List   Diagnosis Date Noted   History of atrial fibrillation 10/09/2020   Weakness generalized 10/09/2020   Constipation 09/07/2020   Acute gastric ulcer without hemorrhage or perforation    GI bleed 09/05/2020   PAF (paroxysmal atrial fibrillation) ([place]) 09/05/2020   Symptomatic anemia 09/05/2020   Peripheral arterial disease ([place]) 08/20/2020   Dysuria 07/27/2020   Sepsis due to Escherichia coli (E. coli) ([place]) 05/07/2020   Hypokalemia 05/06/2020   Hyperglycemia due to diabetes mellitus ([place]) 05/06/2020   Hypoalbuminemia 05/06/2020   Prolonged QT interval 05/06/2020   Lactic acidosis 05/06/2020   Bronchitis due to tobacco use 05/06/2020   Acute respiratory failure with hypoxia ([place]) 05/06/2020   Acute on chronic respiratory failure with hypoxia ([place]) 05/06/2020   COPD with acute exacerbation ([place]) 05/06/2020   Tobacco abuse 05/06/2020   Urinary retention 03/09/2020   Acute cystitis with hematuria    Urinary tract infection with hematuria    Dyspnea    Hypoxia    Sepsis secondary to UTI ([place]) 03/03/2020   COPD GOLD III/ group D still smoking  10/24/2019   PAD (peripheral artery disease) ([place]) 09/13/2019   Obstructive sleep apnea treated with continuous positive airway pressure (CPAP) 05/31/2018   Diabetic neuropathy (  [place]) 12/29/2017   Cigarette smoker 12/29/2017   Hyperlipidemia associated with type 2 diabetes mellitus ([place]) 12/29/2017   Primary insomnia 12/29/2017   Primary osteoarthritis involving multiple joints 12/29/2017   OSA (obstructive sleep apnea) 12/10/2017   Hypothyroidism 06/09/2017   Joint pain 08/05/2016   Bilateral hand swelling 08/05/2016   Hyponatremia 08/05/2016   Pulmonary nodule, left 07/16/2016   CHF (congestive heart failure) ([place]) 07/09/2016   DM (diabetes mellitus) ([place]) 07/09/2016   Non-small cell lung cancer, right ([place]) 04/18/2016   Essential  hypertension 02/01/2016   COPD (chronic obstructive pulmonary disease) ([place]) 02/01/2016   BPH with urinary obstruction 02/01/2016   Chest pain, unspecified 10/26/2008    Past Surgical History:  Procedure Laterality Date   ABDOMINAL AORTOGRAM W/LOWER EXTREMITY Left 02/06/2020   Procedure: ABDOMINAL AORTOGRAM W/LOWER EXTREMITY;  Surgeon: Lorretta Harp, MD;  Location: [facility];  Service: Cardiovascular;  Laterality: Left;   CARDIOVERSION N/A 10/05/2020   Procedure: CARDIOVERSION;  Surgeon: Geralynn Rile, MD;  Location: [facility];  Service: Cardiovascular;  Laterality: N/A;   CATARACT EXTRACTION, BILATERAL Bilateral    ENDARTERECTOMY FEMORAL Right 08/20/2020   Procedure: ENDARTERECTOMY  RIGHT FEMORAL ARTERY;  Surgeon: Marty Heck, MD;  Location: [facility];  Service: Vascular;  Laterality: Right;   ESOPHAGOGASTRODUODENOSCOPY (EGD) WITH PROPOFOL N/A 09/06/2020   Procedure: ESOPHAGOGASTRODUODENOSCOPY (EGD) WITH PROPOFOL;  Surgeon: Irene Shipper, MD;  Location: [facility];  Service: Endoscopy;  Laterality: N/A;   INSERTION OF ILIAC STENT Right 08/20/2020   Procedure: RETROGRADE INSERTION OF RIGHT ILIAC STENT;  Surgeon: Marty Heck, MD;  Location: [facility];  Service: Vascular;  Laterality: Right;   INTRAOPERATIVE ARTERIOGRAM Right 08/20/2020   Procedure: INTRA OPERATIVE ARTERIOGRAM ILIAC;  Surgeon: Marty Heck, MD;  Location: [facility];  Service: Vascular;  Laterality: Right;   PATCH ANGIOPLASTY Right 08/20/2020   Procedure: PATCH ANGIOPLASTY RIGHT FEMORAL ARTERY;  Surgeon: Marty Heck, MD;  Location: [facility];  Service: Vascular;  Laterality: Right;   PORTACATH PLACEMENT Left 06/13/2016   Procedure: INSERTION PORT-A-CATH;  Surgeon: Aviva Signs, MD;  Location: [facility];  Service: General;  Laterality: Left;   TRANSURETHRAL RESECTION OF PROSTATE N/A 05/31/2020   Procedure: TRANSURETHRAL RESECTION OF THE PROSTATE (TURP);  Surgeon: Cleon Gustin, MD;  Location:  [facility];  Service: Urology;  Laterality: N/A;   VIDEO BRONCHOSCOPY WITH ENDOBRONCHIAL NAVIGATION N/A 05/28/2016   Procedure: VIDEO BRONCHOSCOPY WITH ENDOBRONCHIAL NAVIGATION;  Surgeon: [name], MD;  Location: [facility];  Service: Thoracic;  Laterality: N/A;   VIDEO BRONCHOSCOPY WITH ENDOBRONCHIAL ULTRASOUND N/A 05/28/2016   Procedure: VIDEO BRONCHOSCOPY WITH ENDOBRONCHIAL ULTRASOUND;  Surgeon: [name], MD;  Location: [facility];  Service: Thoracic;  Laterality: N/A;       Family History  Problem Relation Age of Onset   Hypertension Mother    Diabetes Father    Heart disease Father    Stroke Father    Hypertension Sister     Social History   Tobacco Use   Smoking status: Every Day    Packs/day: 0.50    Years: 55.00    Pack years: 27.50    Types: Cigarettes    Start date: 03/11/1961   Smokeless tobacco: Never   Tobacco comments:    1/2 pack to 1 pack per day 12/14/19  Vaping Use   Vaping Use: Never used  Substance Use Topics   Alcohol use: No   Drug use: No    Home Medications Prior  to Admission medications   Medication Sig Start Date End Date Taking? Authorizing Provider  acetaminophen (TYLENOL) 500 MG tablet Take 1,000 mg by mouth every 6 (six) hours as needed for fever.    [provider]  acetaminophen (TYLENOL) 650 MG CR tablet Take 650 mg by mouth every 8 (eight) hours as needed for pain.    [provider]  albuterol (VENTOLIN HFA) 108 (90 Base) MCG/ACT inhaler Inhale 2 puffs into the lungs every 4 (four) hours as needed for wheezing or shortness of breath. 08/08/20   [name], FNP  apixaban (ELIQUIS) 5 MG TABS tablet Take 1 tablet (5 mg total) by mouth 2 (two) times daily. 08/22/20   Rhyne, Hulen Shouts, PA-C  Blood Glucose Monitoring Suppl (ONETOUCH VERIO REFLECT) w/Device KIT Use to test blood sugars daily as directed. DX: E11.9 10/14/19   Evelina Dun A, FNP  cloNIDine (CATAPRES) 0.1 MG tablet TAKE 1 TABLET (0.1 MG TOTAL) BY MOUTH  3 (THREE) TIMES DAILY. 06/14/20   Evelina Dun A, FNP  clopidogrel (PLAVIX) 75 MG tablet Take 75 mg by mouth daily.    [provider]  dutasteride (AVODART) 0.5 MG capsule TAKE 1 CAPSULE BY MOUTH EVERY DAY 09/26/20   Evelina Dun A, FNP  famotidine (PEPCID) 10 MG tablet Take 10 mg by mouth daily as needed for heartburn or indigestion.    [provider]  fluticasone (FLONASE) 50 MCG/ACT nasal spray Place 1 spray into both nostrils daily as needed for allergies or rhinitis. 08/03/20   Evelina Dun A, FNP  Fluticasone-Umeclidin-Vilant (TRELEGY ELLIPTA) 100-62.5-25 MCG/INH AEPB Inhale 1 puff into the lungs daily.    [provider]  gabapentin (NEURONTIN) 300 MG capsule TAKE 1 CAPSULE BY MOUTH THREE TIMES A DAY 08/13/20   Hawks, Christy A, FNP  glucose blood (ONETOUCH VERIO) test strip Use to test blood sugars daily as directed. DX: E11.9 10/14/19   Evelina Dun A, FNP  Iron, Ferrous Sulfate, 325 (65 Fe) MG TABS Take 325 mg by mouth 2 (two) times daily. 09/06/20   Geradine Girt, DO  levothyroxine (SYNTHROID) 50 MCG tablet Take 1 tablet (50 mcg total) by mouth daily. 06/05/20 10/04/20  Manuella Ghazi, Pratik D, DO  linaclotide (LINZESS) 145 MCG CAPS capsule Take 1 capsule (145 mcg total) by mouth daily. To regulate bowel movements 08/14/20   Claretta Fraise, MD  metFORMIN (GLUCOPHAGE-XR) 500 MG 24 hr tablet TAKE 1 TABLET BY MOUTH EVERY DAY WITH BREAKFAST 10/01/20   Evelina Dun A, FNP  mupirocin ointment (BACTROBAN) 2 % Apply 1 application topically 2 (two) times daily as needed (wound care). 09/25/20   [provider]  ondansetron (ZOFRAN-ODT) 4 MG disintegrating tablet TAKE 1 TABLET BY MOUTH EVERY 8 HOURS AS NEEDED FOR NAUSEA AND VOMITING 07/24/20   [name], FNP  OneTouch Delica Lancets 52D MISC Use to test blood sugars daily as directed. DX: E11.9 10/14/19   Evelina Dun A, FNP  potassium chloride SA (KLOR-CON) 20 MEQ tablet Take 1 tablet (20 mEq total) by mouth daily.  09/14/20   Evelina Dun A, FNP  pravastatin (PRAVACHOL) 40 MG tablet TAKE 1 TABLET (40 MG TOTAL) BY MOUTH ON MONDAYS , WEDNESDAYS, AND FRIDAYS AS DIRECTED 05/11/20   Hawks, Christy A, FNP  Semaglutide (RYBELSUS) 7 MG TABS Take 7 mg by mouth daily. 03/20/20   Evelina Dun A, FNP  torsemide (DEMADEX) 20 MG tablet Take 2 tablets (40 mg total) by mouth daily. Take 40 mg twice daily for 3  days and then resume 40 mg daily. 10/05/20   Geralynn Rile, MD  torsemide 40 MG TABS Take 40 mg by mouth daily. 09/12/20 12/11/20  Geralynn Rile, MD  zolpidem (AMBIEN) 5 MG tablet Take 5 mg by mouth at bedtime as needed for sleep.    [provider]    Allergies    Jardiance [empagliflozin], Lopressor [metoprolol], Atorvastatin, Rosuvastatin, Sulfa antibiotics, and Temazepam  Review of Systems   Review of Systems All other systems reviewed and are negative except that which was mentioned in HPI  Physical Exam Updated Vital Signs BP 105/63   Pulse (!) 110   Temp 98.9 F (37.2 C) (Oral)   Resp 14   SpO2 100%   Physical Exam Vitals and nursing note reviewed. Exam conducted with a chaperone present.  Constitutional:      General: He is not in acute distress.    Appearance: Normal appearance.     Comments: Frail, chronically ill appearing  HENT:     Head: Normocephalic and atraumatic.  Eyes:     Conjunctiva/sclera: Conjunctivae normal.  Cardiovascular:     Rate and Rhythm: Regular rhythm. Tachycardia present.     Heart sounds: Murmur heard.  Pulmonary:     Effort: Pulmonary effort is normal.     Breath sounds: Normal breath sounds.  Abdominal:     General: Abdomen is flat. Bowel sounds are normal. There is no distension.     Palpations: Abdomen is soft.     Tenderness: There is no abdominal tenderness.  Genitourinary:    Comments: No gross blood or melena on rectal exam Musculoskeletal:     Right lower leg: Edema present.     Left lower leg: Edema present.  Skin:     General: Skin is warm and dry.     Coloration: Skin is pale.  Neurological:     Mental Status: He is alert and oriented to person, place, and time.     Comments: fluent  Psychiatric:        Mood and Affect: Mood normal.        Behavior: Behavior normal.    ED Results / Procedures / Treatments   Labs (all labs ordered are listed, but only abnormal results are displayed) Labs Reviewed  COMPREHENSIVE METABOLIC PANEL - Abnormal; Notable for the following components:      Result Value   Chloride 97 (*)    Glucose, Bld 133 (*)    Albumin 3.4 (*)    All other components within normal limits  BRAIN NATRIURETIC PEPTIDE - Abnormal; Notable for the following components:   B Natriuretic Peptide 358.0 (*)    All other components within normal limits  CBC WITH DIFFERENTIAL/PLATELET - Abnormal; Notable for the following components:   RBC 2.50 (*)    Hemoglobin 6.3 (*)    HCT 22.5 (*)    MCH 24.8 (*)    MCHC 27.6 (*)    RDW 74.4 (*)    Lymphs Abs 0.6 (*)    Monocytes Absolute 1.1 (*)    All other components within normal limits  TROPONIN I (HIGH SENSITIVITY) - Abnormal; Notable for the following components:   Troponin I (High Sensitivity) 62 (*)    All other components within normal limits  RESP PANEL BY RT-PCR (FLU A&B, COVID) [id number]  OCCULT BLOOD X 1 CARD TO LAB, STOOL  POC OCCULT BLOOD, ED    EKG EKG Interpretation  Date/Time:  Tuesday October 09 2020 23:29:11 EDT Ventricular Rate:  112 PR Interval:  179 QRS Duration: 86 QT Interval:  349 QTC Calculation: 477 R Axis:   41 Text Interpretation: Sinus tachycardia Atrial premature complex Borderline low voltage, extremity leads Minimal ST depression, lateral leads Borderline prolonged QT interval rate faster than previous Confirmed by Theotis Burrow [phone]) on 10/09/2020 11:32:41 PM  Radiology DG Chest 2 View  Result Date: 10/09/2020 CLINICAL DATA:  Worsening leg edema with history of congestive heart failure. EXAM: CHEST - 2 VIEW  COMPARISON:  09/13/2020 FINDINGS: Heart size and pulmonary vascularity are normal. Emphysematous changes and fibrosis in the lungs. No evidence of airspace disease or focal consolidation. Calcification in the aorta and mitral valve. Power port type central venous catheter with tip over the low SVC region. No pleural effusions. No pneumothorax. Mediastinal contours appear intact. Degenerative changes in the spine and shoulders. IMPRESSION: Emphysematous and chronic bronchitic changes in the lungs. No evidence of active pulmonary disease. Electronically Signed   By: Lucienne Capers M.D.   On: 10/09/2020 22:01    Procedures Procedures  CRITICAL CARE Performed by: Wenda Overland Mickayla Trouten   Total critical care time: 45 minutes  Critical care time was exclusive of separately billable procedures and treating other patients.  Critical care was necessary to treat or prevent imminent or life-threatening deterioration.  Critical care was time spent personally by me on the following activities: development of treatment plan with patient and/or surrogate as well as nursing, discussions with consultants, evaluation of patient's response to treatment, examination of patient, obtaining history from patient or surrogate, ordering and performing treatments and interventions, ordering and review of laboratory studies, ordering and review of radiographic studies, pulse oximetry and re-evaluation of patient's condition.  Medications Ordered in ED Medications  iohexol (OMNIPAQUE) 300 MG/ML solution 100 mL (100 mLs Intravenous Contrast Given 10/09/20 2344)    ED Course  I have reviewed the triage vital signs and the nursing notes.  Pertinent labs & imaging results that were available during my care of the patient were reviewed by me and considered in my medical decision making (see chart for details).    MDM Rules/Calculators/A&P                          Intermittently tachycardic on exam but normal blood  pressure and no acute distress.  O2 sat occasionally dips into the 80s while he is sleeping but rises to the 90s when awake.  No gross blood or melena on rectal exam.  Chest x-ray without obvious pulmonary edema.  Labs notable for normal creatinine, troponin 62, BNP 358, hemoglobin has dropped at 6.3 today.  He was admitted at the beginning of June for symptomatic anemia requiring blood transfusion.  At that time had full GI evaluation and anemia was ultimately thought to be multifactorial but not likely to be due to acute GI bleeding.  He had a hematoma from previous surgery.  Recommended admission and blood transfusion.  Patient is in agreement with blood transfusion.  Because he is normotensive currently, will hold off until he is able to be transferred for blood bank access.   Discussed admission w/ [facility], Dr. Nevada Crane. We agreed to obtain CT abd/pelvis to evaluate for source of bleeding given no obvious GI bleed currently. Because no stepdown beds currently available, will transfer to ED at [facility] in order to facilitate quicker crossmatched blood transfusion as patient's elevated troponin suggests some heart strain.  EKG without acute ischemic changes and no symptoms to suggest ACS.  I discussed transfer with ED physician Dr. Leonette Monarch.  Final Clinical Impression(s) / ED Diagnoses Final diagnoses:  Symptomatic anemia    Rx / DC Orders ED Discharge Orders     None        Reegan Mctighe, Wenda Overland, MD 10/10/20 0004

## 2020-10-09 NOTE — ED Notes (Signed)
ED Provider at bedside. 

## 2020-10-09 NOTE — Patient Instructions (Signed)
Weakness Weakness is a lack of strength. You may feel weak all over your body (generalized), or you may feel weak in one part of your body (focal). There are many potential causes of weakness. Sometimes, the cause of your weakness may not be known. Some causes of weakness can be serious, so it isimportant to see your doctor. Follow these instructions at home: Activity Rest as needed. Try to get enough sleep. Most adults need 7-8 hours of sleep each night. Talk to your doctor about how much sleep you need each night. Do exercises, such as arm curls and leg raises, for 30 minutes at least 2 days a week or as told by your doctor. Think about working with a physical therapist or trainer to help you get stronger. General instructions  Take over-the-counter and prescription medicines only as told by your doctor. Eat a healthy, well-balanced diet. This includes: Proteins to build muscles, such as lean meats and fish. Fresh fruits and vegetables. Carbohydrates to boost energy, such as whole grains. Drink enough fluid to keep your pee (urine) pale yellow. Keep all follow-up visits as told by your doctor. This is important.  Contact a doctor if: Your weakness does not get better or it gets worse. Your weakness affects your ability to: Think clearly. Do your normal daily activities. Get help right away if you: Have sudden weakness on one side of your face or body. Have chest pain. Have trouble breathing or shortness of breath. Have problems with your vision. Have trouble talking or swallowing. Have trouble standing or walking. Are light-headed. Pass out (lose consciousness). Summary Weakness is a lack of strength. You may feel weak all over your body or just in one part of your body. There are many potential causes of weakness. Sometimes, the cause of your weakness may not be known. Rest as needed, and try to get enough sleep. Most adults need 7-8 hours of sleep each night. Eat a healthy,  well-balanced diet. This information is not intended to replace advice given to you by your health care provider. Make sure you discuss any questions you have with your healthcare provider. Document Revised: 10/28/2017 Document Reviewed: 10/28/2017 Elsevier Patient Education  2022 [place]. Atrial Fibrillation  Atrial fibrillation is a type of heartbeat that is irregular or fast. If you have this condition, your heart beats without any order. This makes it hard foryour heart to pump blood in a normal way. Atrial fibrillation may come and go, or it may become a long-lasting problem. If this condition is not treated, it can put you at higher risk for stroke,heart failure, and other heart problems. What are the causes? This condition may be caused by diseases that damage the heart. They include: High blood pressure. Heart failure. Heart valve disease. Heart surgery. Other causes include: Diabetes. Thyroid disease. Being overweight. Kidney disease. Sometimes the cause is not known. What increases the risk? You are more likely to develop this condition if: You are older. You smoke. You exercise often and very hard. You have a family history of this condition. You are a man. You use drugs. You drink a lot of alcohol. You have lung conditions, such as emphysema, pneumonia, or COPD. You have sleep apnea. What are the signs or symptoms? Common symptoms of this condition include: A feeling that your heart is beating very fast. Chest pain or discomfort. Feeling short of breath. Suddenly feeling light-headed or weak. Getting tired easily during activity. Fainting. Sweating. In some cases, there are no symptoms.  How is this treated? Treatment for this condition depends on underlying conditions and how you feel when you have atrial fibrillation. They include: Medicines to: Prevent blood clots. Treat heart rate or heart rhythm problems. Using devices, such as a pacemaker, to  correct heart rhythm problems. Doing surgery to remove the part of the heart that sends bad signals. Closing an area where clots can form in the heart (left atrial appendage). In some cases, your doctor will treat other underlying conditions. Follow these instructions at home: Medicines Take over-the-counter and prescription medicines only as told by your doctor. Do not take any new medicines without first talking to your doctor. If you are taking blood thinners: Talk with your doctor before you take any medicines that have aspirin or NSAIDs, such as ibuprofen, in them. Take your medicine exactly as told by your doctor. Take it at the same time each day. Avoid activities that could hurt or bruise you. Follow instructions about how to prevent falls. Wear a bracelet that says you are taking blood thinners. Or, carry a card that lists what medicines you take. Lifestyle     Do not use any products that have nicotine or tobacco in them. These include cigarettes, e-cigarettes, and chewing tobacco. If you need help quitting, ask your doctor. Eat heart-healthy foods. Talk with your doctor about the right eating plan for you. Exercise regularly as told by your doctor. Do not drink alcohol. Lose weight if you are overweight. Do not use drugs, including cannabis. General instructions If you have a condition that causes breathing to stop for a short period of time (apnea), treat it as told by your doctor. Keep a healthy weight. Do not use diet pills unless your doctor says they are safe for you. Diet pills may make heart problems worse. Keep all follow-up visits as told by your doctor. This is important. Contact a doctor if: You notice a change in the speed, rhythm, or strength of your heartbeat. You are taking a blood-thinning medicine and you get more bruising. You get tired more easily when you move or exercise. You have a sudden change in weight. Get help right away if:  You have pain in  your chest or your belly (abdomen). You have trouble breathing. You have side effects of blood thinners, such as blood in your vomit, poop (stool), or pee (urine), or bleeding that cannot stop. You have any signs of a stroke. "BE FAST" is an easy way to remember the main warning signs: B - Balance. Signs are dizziness, sudden trouble walking, or loss of balance. E - Eyes. Signs are trouble seeing or a change in how you see. F - Face. Signs are sudden weakness or loss of feeling in the face, or the face or eyelid drooping on one side. A - Arms. Signs are weakness or loss of feeling in an arm. This happens suddenly and usually on one side of the body. S - Speech. Signs are sudden trouble speaking, slurred speech, or trouble understanding what people say. T - Time. Time to call emergency services. Write down what time symptoms started. You have other signs of a stroke, such as: A sudden, very bad headache with no known cause. Feeling like you may vomit (nausea). Vomiting. A seizure. These symptoms may be an emergency. Do not wait to see if the symptoms will go away. Get medical help right away. Call your local emergency services (911 in the [place] Do not drive yourself to the hospital. Summary Atrial  fibrillation is a type of heartbeat that is irregular or fast. You are at higher risk of this condition if you smoke, are older, have diabetes, or are overweight. Follow your doctor's instructions about medicines, diet, exercise, and follow-up visits. Get help right away if you have signs or symptoms of a stroke. Get help right away if you cannot catch your breath, or you have chest pain or discomfort. This information is not intended to replace advice given to you by your health care provider. Make sure you discuss any questions you have with your healthcare provider. Document Revised: 09/15/2018 Document Reviewed: 09/15/2018 Elsevier Patient Education  [name].

## 2020-10-09 NOTE — Telephone Encounter (Signed)
Called patient, he would like to have Dr.O'Neal review the notes from PCP today- they did an EKG that is in Epic as well. He would like to know if he should take his Torsemide as he has not taken one today. Weight on visit today. I did advise of previous message if worse to go to ED. Thank you!

## 2020-10-09 NOTE — Progress Notes (Signed)
Established Patient Office Visit  Subjective:  Patient ID: John [name], male    DOB: Jun 01, 1945  Age: 75 y.o. MRN: [id number]  CC:  Chief Complaint  Patient presents with   Dizziness   Fatigue   Leg Swelling   Edema    HPI John [name] presents for Fatigue: Patient complains of fatigue. Symptoms began several days ago. Sent[place] symptom the patient feels fatigue began with: none. Symptoms of his fatigue have been general malaise. Patient describes the following psychologic symptoms: none.  Patient denies none. Symptoms have gradually worsened. Severity has been struggles to carry out day to day responsibilities.. Previous visits for this problem: none.     Edema: Patient complains of edema. The location of the edema is feet bilateral.  The edema has been severe.  Onset of symptoms was a few days ago, unchanged since that time. The edema is present all day. The patient states this is new.  The swelling has been aggravated by history of A.fib, relieved by diuretics, and been associated with diagnosis of heart failure. Cardiac risk factors include advanced age (older than 66 for men, 61 for women) and hypertension.    Past Medical History:  Diagnosis Date   Adenocarcinoma of lung, right ([facility]) 04/18/2016   Anxiety    Arthritis    Asthma    BPH (benign prostatic hyperplasia)    with urinary retention 02/06/20   CHF (congestive heart failure) ([facility])    COPD (chronic obstructive pulmonary disease) ([place])    Depression    Diabetes mellitus without complication ([facility])    no meds   Diabetes mellitus, type II ([facility])    DM (diabetes mellitus) ([facility]) 07/09/2016   Dyspnea    History of kidney stones    History of radiation therapy 06/25/16-08/20/16   right lung   Hyperlipidemia    Hypertension    Hypertension    Hypothyroidism    Macular degeneration    Neuropathy    Non-small cell lung cancer, right ([facility]) 04/18/2016   Peripheral vascular disease ([facility] [place])    Prostatitis    Pulmonary  nodule, left 07/16/2016   Sleep apnea    cpap    Past Surgical History:  Procedure Laterality Date   ABDOMINAL AORTOGRAM W/LOWER EXTREMITY Left 02/06/2020   Procedure: ABDOMINAL AORTOGRAM W/LOWER EXTREMITY;  Surgeon: Lorretta Harp, MD;  Location: [facility];  Service: Cardiovascular;  Laterality: Left;   CARDIOVERSION N/A 10/05/2020   Procedure: CARDIOVERSION;  Surgeon: Geralynn Rile, MD;  Location: [facility];  Service: Cardiovascular;  Laterality: N/A;   CATARACT EXTRACTION, BILATERAL Bilateral    ENDARTERECTOMY FEMORAL Right 08/20/2020   Procedure: ENDARTERECTOMY  RIGHT FEMORAL ARTERY;  Surgeon: Marty Heck, MD;  Location: [facility];  Service: Vascular;  Laterality: Right;   ESOPHAGOGASTRODUODENOSCOPY (EGD) WITH PROPOFOL N/A 09/06/2020   Procedure: ESOPHAGOGASTRODUODENOSCOPY (EGD) WITH PROPOFOL;  Surgeon: Irene Shipper, MD;  Location: [facility];  Service: Endoscopy;  Laterality: N/A;   INSERTION OF ILIAC STENT Right 08/20/2020   Procedure: RETROGRADE INSERTION OF RIGHT ILIAC STENT;  Surgeon: Marty Heck, MD;  Location: [facility];  Service: Vascular;  Laterality: Right;   INTRAOPERATIVE ARTERIOGRAM Right 08/20/2020   Procedure: INTRA OPERATIVE ARTERIOGRAM ILIAC;  Surgeon: Marty Heck, MD;  Location: [facility];  Service: Vascular;  Laterality: Right;   PATCH ANGIOPLASTY Right 08/20/2020   Procedure: PATCH ANGIOPLASTY RIGHT FEMORAL ARTERY;  Surgeon: Marty Heck, MD;  Location: [facility];  Service: Vascular;  Laterality: Right;  PORTACATH PLACEMENT Left 06/13/2016   Procedure: INSERTION PORT-A-CATH;  Surgeon: Aviva Signs, MD;  Location: [facility];  Service: General;  Laterality: Left;   TRANSURETHRAL RESECTION OF PROSTATE N/A 05/31/2020   Procedure: TRANSURETHRAL RESECTION OF THE PROSTATE (TURP);  Surgeon: Cleon Gustin, MD;  Location: [facility];  Service: Urology;  Laterality: N/A;   VIDEO BRONCHOSCOPY WITH ENDOBRONCHIAL NAVIGATION N/A 05/28/2016    Procedure: VIDEO BRONCHOSCOPY WITH ENDOBRONCHIAL NAVIGATION;  Surgeon: [name], MD;  Location: [facility];  Service: Thoracic;  Laterality: N/A;   VIDEO BRONCHOSCOPY WITH ENDOBRONCHIAL ULTRASOUND N/A 05/28/2016   Procedure: VIDEO BRONCHOSCOPY WITH ENDOBRONCHIAL ULTRASOUND;  Surgeon: [name], MD;  Location: [facility];  Service: Thoracic;  Laterality: N/A;    Family History  Problem Relation Age of Onset   Hypertension Mother    Diabetes Father    Heart disease Father    Stroke Father    Hypertension Sister     Social History   Socioeconomic History   Marital status: Soil scientist    Spouse name: Not on file   Number of children: 5   Years of education: 10   Highest education level: 10th grade  Occupational History   Occupation: Retired  Tobacco Use   Smoking status: Every Day    Packs/day: 0.50    Years: 55.00    Pack years: 27.50    Types: Cigarettes    Start date: 03/11/1961   Smokeless tobacco: Never   Tobacco comments:    1/2 pack to 1 pack per day 12/14/19  Vaping Use   Vaping Use: Never used  Substance and Sexual Activity   Alcohol use: No   Drug use: No   Sexual activity: Yes    Birth control/protection: None  Other Topics Concern   Not on file  Social History Narrative   [name] is his POA and lives with him. Children out of town - one in [place], others in [place].   Social Determinants of Health   Financial Resource Strain: Low Risk    Difficulty of Paying Living Expenses: Not hard at all  Food Insecurity: No Food Insecurity   Worried About Charity fundraiser in the Last Year: Never true   [place] in the Last Year: Never true  Transportation Needs: No Transportation Needs   Lack of Transportation (Medical): No   Lack of Transportation (Non-Medical): No  Physical Activity: Unknown   Days of Exercise per Week: 7 days   Minutes of Exercise per Session: Not on file  Stress: No Stress Concern Present   Feeling of Stress :  Not at all  Social Connections: Moderately Isolated   Frequency of Communication with Friends and Family: More than three times a week   Frequency of Social Gatherings with Friends and Family: Once a week   Attends Religious Services: Never   Marine scientist or Organizations: No   Attends Music therapist: Never   Marital Status: Living with partner  Intimate Partner Violence: Not At Risk   Fear of Current or Ex-Partner: No   Emotionally Abused: No   Physically Abused: No   Sexually Abused: No    Outpatient Medications Prior to Visit  Medication Sig Dispense Refill   acetaminophen (TYLENOL) 500 MG tablet Take 1,000 mg by mouth every 6 (six) hours as needed for fever.     acetaminophen (TYLENOL) 650 MG CR tablet Take 650 mg by mouth every 8 (eight) hours as needed  for pain.     albuterol (VENTOLIN HFA) 108 (90 Base) MCG/ACT inhaler Inhale 2 puffs into the lungs every 4 (four) hours as needed for wheezing or shortness of breath. 8 g 6   apixaban (ELIQUIS) 5 MG TABS tablet Take 1 tablet (5 mg total) by mouth 2 (two) times daily. 60 tablet 3   Blood Glucose Monitoring Suppl (ONETOUCH VERIO REFLECT) w/Device KIT Use to test blood sugars daily as directed. DX: E11.9 1 kit 1   cloNIDine (CATAPRES) 0.1 MG tablet TAKE 1 TABLET (0.1 MG TOTAL) BY MOUTH 3 (THREE) TIMES DAILY. 270 tablet 0   clopidogrel (PLAVIX) 75 MG tablet Take 75 mg by mouth daily.     dutasteride (AVODART) 0.5 MG capsule TAKE 1 CAPSULE BY MOUTH EVERY DAY 90 capsule 0   famotidine (PEPCID) 10 MG tablet Take 10 mg by mouth daily as needed for heartburn or indigestion.     fluticasone (FLONASE) 50 MCG/ACT nasal spray Place 1 spray into both nostrils daily as needed for allergies or rhinitis. 18.2 mL 2   Fluticasone-Umeclidin-Vilant (TRELEGY ELLIPTA) 100-62.5-25 MCG/INH AEPB Inhale 1 puff into the lungs daily.     gabapentin (NEURONTIN) 300 MG capsule TAKE 1 CAPSULE BY MOUTH THREE TIMES A DAY 270 capsule 0    glucose blood (ONETOUCH VERIO) test strip Use to test blood sugars daily as directed. DX: E11.9 100 each 12   Iron, Ferrous Sulfate, 325 (65 Fe) MG TABS Take 325 mg by mouth 2 (two) times daily. 60 tablet 0   linaclotide (LINZESS) 145 MCG CAPS capsule Take 1 capsule (145 mcg total) by mouth daily. To regulate bowel movements 30 capsule 5   metFORMIN (GLUCOPHAGE-XR) 500 MG 24 hr tablet TAKE 1 TABLET BY MOUTH EVERY DAY WITH BREAKFAST 90 tablet 0   mupirocin ointment (BACTROBAN) 2 % Apply 1 application topically 2 (two) times daily as needed (wound care).     ondansetron (ZOFRAN-ODT) 4 MG disintegrating tablet TAKE 1 TABLET BY MOUTH EVERY 8 HOURS AS NEEDED FOR NAUSEA AND VOMITING 20 tablet 0   OneTouch Delica Lancets 40R MISC Use to test blood sugars daily as directed. DX: E11.9 100 each 1   potassium chloride SA (KLOR-CON) 20 MEQ tablet Take 1 tablet (20 mEq total) by mouth daily. 90 tablet 0   pravastatin (PRAVACHOL) 40 MG tablet TAKE 1 TABLET (40 MG TOTAL) BY MOUTH ON MONDAYS , WEDNESDAYS, AND FRIDAYS AS DIRECTED 48 tablet 3   Semaglutide (RYBELSUS) 7 MG TABS Take 7 mg by mouth daily. 90 tablet 1   torsemide (DEMADEX) 20 MG tablet Take 2 tablets (40 mg total) by mouth daily. Take 40 mg twice daily for 3 days and then resume 40 mg daily. 60 tablet 3   torsemide 40 MG TABS Take 40 mg by mouth daily. 180 tablet 3   zolpidem (AMBIEN) 5 MG tablet Take 5 mg by mouth at bedtime as needed for sleep.     levothyroxine (SYNTHROID) 50 MCG tablet Take 1 tablet (50 mcg total) by mouth daily. 30 tablet 2   No facility-administered medications prior to visit.    Allergies  Allergen Reactions   Jardiance [Empagliflozin] Other (See Comments)    FEELS SLUGGISH, TIRED   Lopressor [Metoprolol] Other (See Comments)    Fatigue   Atorvastatin     Muscle aches - tolerating Pravastatin 40 mg MWF   Rosuvastatin     Muscle aches - tolerating Pravastatin 40 mg MWF   Sulfa Antibiotics Swelling    Mouth  swelling    Temazepam     Made insomnia worse     ROS Review of Systems  Constitutional: Negative.   HENT: Negative.    Respiratory: Negative.    Cardiovascular:  Positive for leg swelling.  Gastrointestinal: Negative.   Genitourinary: Negative.   Musculoskeletal:  Positive for joint swelling.  Skin:  Positive for color change and rash.  All other systems reviewed and are negative.    Objective:    Physical Exam Vitals reviewed.  Constitutional:      Appearance: Normal appearance.  HENT:     Head: Normocephalic.     Nose: Nose normal.     Mouth/Throat:     Mouth: Mucous membranes are moist.     Pharynx: Oropharynx is clear.  Eyes:     Conjunctiva/sclera: Conjunctivae normal.  Cardiovascular:     Rate and Rhythm: Normal rate and regular rhythm.     Pulses: Normal pulses.     Heart sounds: Normal heart sounds.  Pulmonary:     Effort: Pulmonary effort is normal.     Breath sounds: Normal breath sounds.  Abdominal:     General: Bowel sounds are normal.  Musculoskeletal:        General: Swelling present.     Right lower leg: Tenderness present. 1+ Edema present.     Left lower leg: Tenderness present. 1+ Edema present.  Skin:    Coloration: Skin is pale.  Neurological:     Mental Status: He is alert and oriented to person, place, and time.  Psychiatric:        Behavior: Behavior normal.    BP (!) 113/53   Pulse (!) 107   Temp 97.8 F (36.6 C) (Temporal)   Ht 5' 5.5" (1.664 m)   Wt 158 lb (71.7 kg)   SpO2 100%   BMI 25.89 kg/m  Wt Readings from Last 3 Encounters:  10/09/20 158 lb (71.7 kg)  10/05/20 158 lb (71.7 kg)  09/26/20 156 lb 8 oz (71 kg)     Health Maintenance Due  Topic Date Due   COVID-19 Vaccine (4 - Booster for Moderna series) 05/02/2020   FOOT EXAM  09/12/2020    There are no preventive care reminders to display for this patient.  Lab Results  Component Value Date   TSH 0.628 08/21/2020   Lab Results  Component Value Date   WBC 12.2 (H)  09/13/2020   HGB 9.7 (L) 09/13/2020   HCT 32.8 (L) 09/13/2020   MCV 85 09/13/2020   PLT 334 09/13/2020   Lab Results  Component Value Date   NA 141 09/17/2020   K 3.5 09/17/2020   CO2 27 09/17/2020   GLUCOSE 168 (H) 09/17/2020   BUN 15 09/17/2020   CREATININE 0.90 09/17/2020   BILITOT 0.3 09/17/2020   ALKPHOS 123 (H) 09/17/2020   AST 12 09/17/2020   ALT 11 09/17/2020   PROT 6.4 09/17/2020   ALBUMIN 3.7 09/17/2020   CALCIUM 8.9 09/17/2020   ANIONGAP 12 09/06/2020   EGFR 90 09/17/2020   Lab Results  Component Value Date   CHOL 126 08/21/2020   Lab Results  Component Value Date   HDL 28 (L) 08/21/2020   Lab Results  Component Value Date   LDLCALC 80 08/21/2020   Lab Results  Component Value Date   TRIG 89 08/21/2020   Lab Results  Component Value Date   CHOLHDL 4.5 08/21/2020   Lab Results  Component Value Date   HGBA1C 7.5 (H)  08/20/2020      Assessment & Plan:   Problem List Items Addressed This Visit       Other   History of atrial fibrillation - Primary    Patient is in clinic today for fatigue, bilateral lower extremity edema and increased weakness.  Patient is pale looking and moving very slowly.  Bilateral lower extremity edema +1.  Has elevated pulse with decreased blood pressure.  Completed CMP and EKG, results will be faxed to Dr  Audie Box.  Patient did not want to wait in the emergency department day for he is not going to the hospital until he hears from Dr. Audie Box. Education provided to patient with printed handouts given.  Follow-up with worsening or unresolved symptoms.       Relevant Orders   EKG 12-Lead (Completed)   Comprehensive metabolic panel   Weakness generalized   Relevant Orders   Comprehensive metabolic panel    No orders of the defined types were placed in this encounter.   Follow-up: Return if symptoms worsen or fail to improve.    Ivy Lynn, NP

## 2020-10-09 NOTE — Telephone Encounter (Signed)
John [name] is calling to see if the results his PCP are supposed to be sending have been received.    Pt c/o swelling: STAT is pt has developed SOB within 24 hours  If swelling, where is the swelling located? Feet and Legs   How much weight have you gained and in what time span? No   Have you gained 3 pounds in a day or 5 pounds in a week? No   Do you have a log of your daily weights (if so, list)? Weight has remained the same still 158 lbs   Are you currently taking a fluid pill? No, Cecilie Kicks has advised to hold has not taken since yesterday morning  Are you currently SOB? No   Have you traveled recently? No   STAT if patient feels like he/she is going to faint   Are you dizzy now? No  Do you feel faint or have you passed out? Feels he is going to faint, says it seems he is going to black out   Do you have any other symptoms? Swelling and break out of blood blisters on back and arms   Have you checked your HR and BP (record if available)? Hasn't checked since hospitalized

## 2020-10-09 NOTE — Telephone Encounter (Signed)
Patient called into triage.  See previous phone message.

## 2020-10-10 ENCOUNTER — Encounter (HOSPITAL_COMMUNITY): Payer: Self-pay | Admitting: Family Medicine

## 2020-10-10 DIAGNOSIS — I5032 Chronic diastolic (congestive) heart failure: Secondary | ICD-10-CM

## 2020-10-10 DIAGNOSIS — R2243 Localized swelling, mass and lump, lower limb, bilateral: Secondary | ICD-10-CM | POA: Diagnosis not present

## 2020-10-10 DIAGNOSIS — E1142 Type 2 diabetes mellitus with diabetic polyneuropathy: Secondary | ICD-10-CM

## 2020-10-10 DIAGNOSIS — R7989 Other specified abnormal findings of blood chemistry: Secondary | ICD-10-CM | POA: Diagnosis present

## 2020-10-10 DIAGNOSIS — G4733 Obstructive sleep apnea (adult) (pediatric): Secondary | ICD-10-CM | POA: Diagnosis not present

## 2020-10-10 DIAGNOSIS — R778 Other specified abnormalities of plasma proteins: Secondary | ICD-10-CM | POA: Diagnosis present

## 2020-10-10 DIAGNOSIS — Z85118 Personal history of other malignant neoplasm of bronchus and lung: Secondary | ICD-10-CM | POA: Diagnosis not present

## 2020-10-10 DIAGNOSIS — R600 Localized edema: Secondary | ICD-10-CM | POA: Diagnosis not present

## 2020-10-10 DIAGNOSIS — F17210 Nicotine dependence, cigarettes, uncomplicated: Secondary | ICD-10-CM | POA: Diagnosis not present

## 2020-10-10 DIAGNOSIS — E876 Hypokalemia: Secondary | ICD-10-CM | POA: Diagnosis not present

## 2020-10-10 DIAGNOSIS — I480 Paroxysmal atrial fibrillation: Secondary | ICD-10-CM | POA: Diagnosis not present

## 2020-10-10 DIAGNOSIS — R000 Tachycardia, unspecified: Secondary | ICD-10-CM | POA: Diagnosis not present

## 2020-10-10 DIAGNOSIS — J449 Chronic obstructive pulmonary disease, unspecified: Secondary | ICD-10-CM | POA: Diagnosis not present

## 2020-10-10 DIAGNOSIS — E039 Hypothyroidism, unspecified: Secondary | ICD-10-CM | POA: Diagnosis not present

## 2020-10-10 DIAGNOSIS — I739 Peripheral vascular disease, unspecified: Secondary | ICD-10-CM | POA: Diagnosis not present

## 2020-10-10 DIAGNOSIS — Z7984 Long term (current) use of oral hypoglycemic drugs: Secondary | ICD-10-CM | POA: Diagnosis not present

## 2020-10-10 DIAGNOSIS — Z7901 Long term (current) use of anticoagulants: Secondary | ICD-10-CM | POA: Diagnosis not present

## 2020-10-10 DIAGNOSIS — Z79899 Other long term (current) drug therapy: Secondary | ICD-10-CM | POA: Diagnosis not present

## 2020-10-10 DIAGNOSIS — E119 Type 2 diabetes mellitus without complications: Secondary | ICD-10-CM | POA: Diagnosis not present

## 2020-10-10 DIAGNOSIS — I110 Hypertensive heart disease with heart failure: Secondary | ICD-10-CM | POA: Diagnosis not present

## 2020-10-10 DIAGNOSIS — C3491 Malignant neoplasm of unspecified part of right bronchus or lung: Secondary | ICD-10-CM

## 2020-10-10 DIAGNOSIS — D509 Iron deficiency anemia, unspecified: Secondary | ICD-10-CM | POA: Diagnosis not present

## 2020-10-10 DIAGNOSIS — Z20822 Contact with and (suspected) exposure to covid-19: Secondary | ICD-10-CM | POA: Diagnosis not present

## 2020-10-10 DIAGNOSIS — J45909 Unspecified asthma, uncomplicated: Secondary | ICD-10-CM | POA: Diagnosis not present

## 2020-10-10 DIAGNOSIS — D649 Anemia, unspecified: Secondary | ICD-10-CM

## 2020-10-10 DIAGNOSIS — R634 Abnormal weight loss: Secondary | ICD-10-CM | POA: Diagnosis not present

## 2020-10-10 DIAGNOSIS — R5383 Other fatigue: Secondary | ICD-10-CM | POA: Diagnosis present

## 2020-10-10 LAB — BASIC METABOLIC PANEL
Anion gap: 5 (ref 5–15)
BUN: 11 mg/dL (ref 8–23)
CO2: 33 mmol/L — ABNORMAL HIGH (ref 22–32)
Calcium: 8.6 mg/dL — ABNORMAL LOW (ref 8.9–10.3)
Chloride: 100 mmol/L (ref 98–111)
Creatinine, Ser: 0.73 mg/dL (ref 0.61–1.24)
GFR, Estimated: >60 mL/min (ref >60)
Glucose, Bld: 129 mg/dL — ABNORMAL HIGH (ref 70–99)
Potassium: 3.2 mmol/L — ABNORMAL LOW (ref 3.5–5.1)
Sodium: 138 mmol/L (ref 135–145)

## 2020-10-10 LAB — COMPREHENSIVE METABOLIC PANEL
ALT: 10 IU/L (ref 0–44)
AST: 16 IU/L (ref 0–40)
Albumin/Globulin Ratio: 1.6 (ref 1.2–2.2)
Albumin: 3.6 g/dL — ABNORMAL LOW (ref 3.7–4.7)
Alkaline Phosphatase: 132 IU/L — ABNORMAL HIGH (ref 44–121)
BUN/Creatinine Ratio: 20 (ref 10–24)
BUN: 18 mg/dL (ref 8–27)
Bilirubin Total: 0.2 mg/dL (ref 0.0–1.2)
CO2: 28 mmol/L (ref 20–29)
Calcium: 8.4 mg/dL — ABNORMAL LOW (ref 8.6–10.2)
Chloride: 94 mmol/L — ABNORMAL LOW (ref 96–106)
Creatinine, Ser: 0.9 mg/dL (ref 0.76–1.27)
Globulin, Total: 2.3 g/dL (ref 1.5–4.5)
Glucose: 135 mg/dL — ABNORMAL HIGH (ref 65–99)
Potassium: 4 mmol/L (ref 3.5–5.2)
Sodium: 136 mmol/L (ref 134–144)
Total Protein: 5.9 g/dL — ABNORMAL LOW (ref 6.0–8.5)
eGFR: 90 mL/min/{1.73_m2} (ref >59)

## 2020-10-10 LAB — HEMOGLOBIN AND HEMATOCRIT, BLOOD
HCT: 30.1 % — ABNORMAL LOW (ref 39.0–52.0)
Hemoglobin: 8.9 g/dL — ABNORMAL LOW (ref 13.0–17.0)

## 2020-10-10 LAB — RESP PANEL BY RT-PCR (FLU A&B, COVID) ARPGX2
Influenza A by PCR: NEGATIVE
Influenza B by PCR: NEGATIVE
SARS Coronavirus 2 by RT PCR: NEGATIVE

## 2020-10-10 LAB — MAGNESIUM: Magnesium: 1.9 mg/dL (ref 1.7–2.4)

## 2020-10-10 LAB — TROPONIN I (HIGH SENSITIVITY): Troponin I (High Sensitivity): 70 ng/L — ABNORMAL HIGH (ref <18)

## 2020-10-10 LAB — PREPARE RBC (CROSSMATCH)

## 2020-10-10 LAB — CBG MONITORING, ED: Glucose-Capillary: 141 mg/dL — ABNORMAL HIGH (ref 70–99)

## 2020-10-10 MED ORDER — POTASSIUM CHLORIDE CRYS ER 20 MEQ PO TBCR: 20.0000 meq | EXTENDED_RELEASE_TABLET | Freq: Every day | ORAL | Status: DC

## 2020-10-10 MED ORDER — ACETAMINOPHEN 325 MG PO TABS: 650.0000 mg | ORAL_TABLET | Freq: Four times a day (QID) | ORAL | Status: DC | PRN

## 2020-10-10 MED ORDER — ZOLPIDEM TARTRATE 5 MG PO TABS: 5.0000 mg | ORAL_TABLET | Freq: Every evening | ORAL | Status: DC | PRN

## 2020-10-10 MED ORDER — CLONIDINE HCL 0.1 MG PO TABS
0.1000 mg | ORAL_TABLET | Freq: Three times a day (TID) | ORAL | Status: DC
  Administered 2020-10-10: 0.1 mg via ORAL
  Filled 2020-10-10: qty 1

## 2020-10-10 MED ORDER — POTASSIUM CHLORIDE CRYS ER 20 MEQ PO TBCR
40.0000 meq | EXTENDED_RELEASE_TABLET | ORAL | Status: AC
  Administered 2020-10-10: 40 meq via ORAL
  Filled 2020-10-10 (×2): qty 2

## 2020-10-10 MED ORDER — ACETAMINOPHEN 650 MG RE SUPP: 650.0000 mg | Freq: Four times a day (QID) | RECTAL | Status: DC | PRN

## 2020-10-10 MED ORDER — ALBUTEROL SULFATE (2.5 MG/3ML) 0.083% IN NEBU: 3.0000 mL | INHALATION_SOLUTION | RESPIRATORY_TRACT | Status: DC | PRN

## 2020-10-10 MED ORDER — FLUTICASONE-UMECLIDIN-VILANT 100-62.5-25 MCG/INH IN AEPB: 1.0000 | INHALATION_SPRAY | Freq: Every day | RESPIRATORY_TRACT | Status: DC

## 2020-10-10 MED ORDER — GABAPENTIN 300 MG PO CAPS
300.0000 mg | ORAL_CAPSULE | Freq: Three times a day (TID) | ORAL | Status: DC
  Administered 2020-10-10: 300 mg via ORAL
  Filled 2020-10-10: qty 1

## 2020-10-10 MED ORDER — DUTASTERIDE 0.5 MG PO CAPS
0.5000 mg | ORAL_CAPSULE | Freq: Every day | ORAL | Status: DC
  Administered 2020-10-10: 0.5 mg via ORAL
  Filled 2020-10-10: qty 1

## 2020-10-10 MED ORDER — CLOPIDOGREL BISULFATE 75 MG PO TABS
75.0000 mg | ORAL_TABLET | Freq: Every day | ORAL | Status: DC
  Administered 2020-10-10: 75 mg via ORAL
  Filled 2020-10-10: qty 1

## 2020-10-10 MED ORDER — SODIUM CHLORIDE 0.9% IV SOLUTION: Freq: Once | INTRAVENOUS | Status: AC

## 2020-10-10 MED ORDER — TORSEMIDE 20 MG PO TABS
40.0000 mg | ORAL_TABLET | Freq: Every day | ORAL | Status: DC
  Administered 2020-10-10: 40 mg via ORAL
  Filled 2020-10-10: qty 2

## 2020-10-10 MED ORDER — APIXABAN 5 MG PO TABS
5.0000 mg | ORAL_TABLET | Freq: Two times a day (BID) | ORAL | Status: DC
  Administered 2020-10-10: 5 mg via ORAL
  Filled 2020-10-10: qty 1

## 2020-10-10 MED ORDER — PANTOPRAZOLE SODIUM 40 MG PO TBEC
40.0000 mg | DELAYED_RELEASE_TABLET | Freq: Every day | ORAL | Status: DC
  Administered 2020-10-10: 40 mg via ORAL
  Filled 2020-10-10: qty 1

## 2020-10-10 MED ORDER — LINACLOTIDE 145 MCG PO CAPS
145.0000 ug | ORAL_CAPSULE | Freq: Every day | ORAL | Status: DC
  Administered 2020-10-10: 145 ug via ORAL
  Filled 2020-10-10: qty 1

## 2020-10-10 MED ORDER — LEVOTHYROXINE SODIUM 25 MCG PO TABS
50.0000 ug | ORAL_TABLET | Freq: Every day | ORAL | Status: DC
  Administered 2020-10-10: 50 ug via ORAL
  Filled 2020-10-10: qty 2

## 2020-10-10 MED ORDER — IRON (FERROUS SULFATE) 325 (65 FE) MG PO TABS: 325.0000 mg | ORAL_TABLET | Freq: Two times a day (BID) | ORAL | 1 refills | Status: DC

## 2020-10-10 MED ORDER — INSULIN ASPART 100 UNIT/ML IJ SOLN
0.0000 [IU] | Freq: Three times a day (TID) | INTRAMUSCULAR | Status: DC
  Administered 2020-10-10: 1 [IU] via SUBCUTANEOUS

## 2020-10-10 MED ORDER — PRAVASTATIN SODIUM 40 MG PO TABS
40.0000 mg | ORAL_TABLET | ORAL | Status: DC
  Administered 2020-10-10: 40 mg via ORAL
  Filled 2020-10-10: qty 1

## 2020-10-10 MED ORDER — FUROSEMIDE 10 MG/ML IJ SOLN
20.0000 mg | Freq: Once | INTRAMUSCULAR | Status: AC
  Administered 2020-10-10: 20 mg via INTRAVENOUS
  Filled 2020-10-10: qty 2

## 2020-10-10 MED ORDER — SENNOSIDES-DOCUSATE SODIUM 8.6-50 MG PO TABS: 1.0000 | ORAL_TABLET | Freq: Two times a day (BID) | ORAL | 0 refills | Status: DC | PRN

## 2020-10-10 MED ORDER — ONDANSETRON HCL 4 MG/2ML IJ SOLN: 4.0000 mg | Freq: Four times a day (QID) | INTRAMUSCULAR | Status: DC | PRN

## 2020-10-10 MED ORDER — UMECLIDINIUM BROMIDE 62.5 MCG/INH IN AEPB
1.0000 | INHALATION_SPRAY | Freq: Every day | RESPIRATORY_TRACT | Status: DC
  Filled 2020-10-10: qty 7

## 2020-10-10 MED ORDER — ONDANSETRON HCL 4 MG PO TABS: 4.0000 mg | ORAL_TABLET | Freq: Four times a day (QID) | ORAL | Status: DC | PRN

## 2020-10-10 MED ORDER — PANTOPRAZOLE SODIUM 40 MG PO TBEC: 40.0000 mg | DELAYED_RELEASE_TABLET | Freq: Every day | ORAL | 1 refills | Status: DC

## 2020-10-10 MED ORDER — FLUTICASONE FUROATE-VILANTEROL 100-25 MCG/INH IN AEPB
1.0000 | INHALATION_SPRAY | Freq: Every day | RESPIRATORY_TRACT | Status: DC
  Filled 2020-10-10: qty 28

## 2020-10-10 NOTE — H&P (Signed)
History and Physical    John [name] GDJ:[id number] DOB: 08/20/1945 DOA: 10/09/2020  PCP: [name], FNP   Patient coming from: Home   Chief Complaint: Fatigue, liightheadedness   HPI: GEN CLAGG is a 75 y.o. male with medical history significant for non-small cell lung cancer with recurrence, chronic diastolic CHF, COPD, OSA, chronic hypoxic respiratory failure, hypothyroidism, type 2 diabetes mellitus, paroxysmal atrial fibrillation on Eliquis, recent electrical cardioversion, PAD with right femoral endarterectomy and common iliac stenting in May 2022, and admission 1 month ago with symptomatic anemia, now presenting to the emergency department with fatigue and lightheadedness.  Patient reports continued weight loss since recent admission, he has also had increased bilateral lower extremity swelling, and then 2 to 3 days of fatigue and lightheadedness, particularly on standing.  Patient had been experiencing dark stools that he attributed to iron, but has since stopped iron due to constipation and has not seen any melena or hematochezia.  He was found to have a tiny nonbleeding gastric ulcer last month and indefinite daily Protonix was recommended but he does not believe he is taking this.  He recently completed radiation for his lung cancer and is due to follow back up with medical oncologist in a couple months.  He ambulates with a cane and has not been experiencing any chest pain with exertion.  His chronic dyspnea seems to be stable.  [facility] ED Course: Upon arrival to the ED, patient is found to be afebrile, saturating well on 2 L/min of supplemental oxygen, tachycardic to 110s, and with stable blood pressure.  EKG features sinus tachycardia with rate 112, PAC, and minimal ST depressions laterally.  Chemistry panel is unremarkable and CBC notable for hemoglobin of 6.3.  Fecal occult blood testing was negative.  Chest x-ray negative for acute cardiopulmonary disease.   CT of the abdomen and pelvis with stable postoperative seroma in the right groin and mildly nodular liver contours which could reflect early cirrhosis.  2 units packed red blood cells were ordered from the emergency department and the patient was transferred to [facility] for admission.  Review of Systems:  All other systems reviewed and apart from HPI, are negative.  Past Medical History:  Diagnosis Date   Adenocarcinoma of lung, right ([place]) 04/18/2016   Anxiety    Arthritis    Asthma    BPH (benign prostatic hyperplasia)    with urinary retention 02/06/20   CHF (congestive heart failure) (HCC)    COPD (chronic obstructive pulmonary disease) ([place])    Depression    Diabetes mellitus without complication ([place])    no meds   Diabetes mellitus, type II ([place])    DM (diabetes mellitus) ([place]) 07/09/2016   Dyspnea    History of kidney stones    History of radiation therapy 06/25/16-08/20/16   right lung   Hyperlipidemia    Hypertension    Hypertension    Hypothyroidism    Macular degeneration    Neuropathy    Non-small cell lung cancer, right ([place]) 04/18/2016   Peripheral vascular disease ([place])    Prostatitis    Pulmonary nodule, left 07/16/2016   Sleep apnea    cpap    Past Surgical History:  Procedure Laterality Date   ABDOMINAL AORTOGRAM W/LOWER EXTREMITY Left 02/06/2020   Procedure: ABDOMINAL AORTOGRAM W/LOWER EXTREMITY;  Surgeon: Lorretta Harp, MD;  Location: [place] [facility];  Service: Cardiovascular;  Laterality: Left;   CARDIOVERSION N/A 10/05/2020   Procedure: CARDIOVERSION;  Surgeon: Geralynn Rile, MD;  Location: [place];  Service: Cardiovascular;  Laterality: N/A;   CATARACT EXTRACTION, BILATERAL Bilateral    ENDARTERECTOMY FEMORAL Right 08/20/2020   Procedure: ENDARTERECTOMY  RIGHT FEMORAL ARTERY;  Surgeon: Marty Heck, MD;  Location: [facility] [place];  Service: Vascular;  Laterality: Right;   ESOPHAGOGASTRODUODENOSCOPY (EGD) WITH PROPOFOL N/A 09/06/2020    Procedure: ESOPHAGOGASTRODUODENOSCOPY (EGD) WITH PROPOFOL;  Surgeon: Irene Shipper, MD;  Location[facility];  Service: Endoscopy;  Laterality: N/A;   INSERTION OF ILIAC STENT Right 08/20/2020   Procedure: RETROGRADE INSERTION OF RIGHT ILIAC STENT;  Surgeon: Marty Heck, MD;  Location: [place];  Service: Vascular;  Laterality: Right;   INTRAOPERATIVE ARTERIOGRAM Right 08/20/2020   Procedure: INTRA OPERATIVE ARTERIOGRAM ILIAC;  Surgeon: Marty Heck, MD;  Location: [place];  Service: Vascular;  Laterality: Right;   PATCH ANGIOPLASTY Right 08/20/2020   Procedure: PATCH ANGIOPLASTY RIGHT FEMORAL ARTERY;  Surgeon: Marty Heck, MD;  Location: [place];  Service: Vascular;  Laterality: Right;   PORTACATH PLACEMENT Left 06/13/2016   Procedure: INSERTION PORT-A-CATH;  Surgeon: Aviva Signs, MD;  Location: [facility];  Service: General;  Laterality: Left;   TRANSURETHRAL RESECTION OF PROSTATE N/A 05/31/2020   Procedure: TRANSURETHRAL RESECTION OF THE PROSTATE (TURP);  Surgeon: Cleon Gustin, MD;  Location: [facility];  Service: Urology;  Laterality: N/A;   VIDEO BRONCHOSCOPY WITH ENDOBRONCHIAL NAVIGATION N/A 05/28/2016   Procedure: VIDEO BRONCHOSCOPY WITH ENDOBRONCHIAL NAVIGATION;  Surgeon: [name], MD;  Location: [place];  Service: Thoracic;  Laterality: N/A;   VIDEO BRONCHOSCOPY WITH ENDOBRONCHIAL ULTRASOUND N/A 05/28/2016   Procedure: VIDEO BRONCHOSCOPY WITH ENDOBRONCHIAL ULTRASOUND;  Surgeon: [name], MD;  Location: [place];  Service: Thoracic;  Laterality: N/A;    Social History:   reports that he has been smoking cigarettes. He started smoking about 59 years ago. He has a 27.50 pack-year smoking history. He has never used smokeless tobacco. He reports that he does not drink alcohol and does not use drugs.  Allergies  Allergen Reactions   Sulfa Antibiotics Swelling    Mouth swelling   Atorvastatin Other (See Comments)    Muscle aches - tolerating Pravastatin  40 mg MWF   Jardiance [Empagliflozin] Other (See Comments)    FEELS SLUGGISH, TIRED   Lopressor [Metoprolol] Other (See Comments)    Fatigue   Rosuvastatin Other (See Comments)    Muscle aches - tolerating Pravastatin 40 mg MWF   Temazepam Other (See Comments)    Made insomnia worse     Family History  Problem Relation Age of Onset   Hypertension Mother    Diabetes Father    Heart disease Father    Stroke Father    Hypertension Sister      Prior to Admission medications   Medication Sig Start Date End Date Taking? Authorizing Provider  acetaminophen (TYLENOL) 500 MG tablet Take 1,000 mg by mouth every 6 (six) hours as needed for fever.   Yes [provider]  acetaminophen (TYLENOL) 650 MG CR tablet Take 650 mg by mouth every 8 (eight) hours as needed for pain.   Yes [provider]  albuterol (VENTOLIN HFA) 108 (90 Base) MCG/ACT inhaler Inhale 2 puffs into the lungs every 4 (four) hours as needed for wheezing or shortness of breath. 08/08/20  Yes Hawks, Christy A, FNP  apixaban (ELIQUIS) 5 MG TABS tablet Take 1 tablet (5 mg total) by mouth 2 (two) times daily. 08/22/20  Yes Leontine Locket  J, PA-C  Blood Glucose Monitoring Suppl (ONETOUCH VERIO REFLECT) w/Device KIT Use to test blood sugars daily as directed. DX: E11.9 10/14/19  Yes Hawks, Christy A, FNP  cloNIDine (CATAPRES) 0.1 MG tablet TAKE 1 TABLET (0.1 MG TOTAL) BY MOUTH 3 (THREE) TIMES DAILY. 06/14/20  Yes Hawks, Christy A, FNP  clopidogrel (PLAVIX) 75 MG tablet Take 75 mg by mouth daily.   Yes [provider]  dutasteride (AVODART) 0.5 MG capsule TAKE 1 CAPSULE BY MOUTH EVERY DAY Patient taking differently: Take 0.5 mg by mouth daily. 09/26/20  Yes Hawks, Christy A, FNP  famotidine (PEPCID) 10 MG tablet Take 10 mg by mouth daily as needed for heartburn or indigestion.   Yes [provider]  fluticasone (FLONASE) 50 MCG/ACT nasal spray Place 1 spray into both nostrils daily as needed for allergies  or rhinitis. 08/03/20  Yes Hawks, Christy A, FNP  Fluticasone-Umeclidin-Vilant (TRELEGY ELLIPTA) 100-62.5-25 MCG/INH AEPB Inhale 1 puff into the lungs daily.   Yes [provider]  gabapentin (NEURONTIN) 300 MG capsule TAKE 1 CAPSULE BY MOUTH THREE TIMES A DAY Patient taking differently: Take 300 mg by mouth 3 (three) times daily. 08/13/20  Yes Hawks, Christy A, FNP  glucose blood (ONETOUCH VERIO) test strip Use to test blood sugars daily as directed. DX: E11.9 10/14/19  Yes Hawks, Christy A, FNP  Iron, Ferrous Sulfate, 325 (65 Fe) MG TABS Take 325 mg by mouth 2 (two) times daily. 09/06/20  Yes Geradine Girt, DO  levothyroxine (SYNTHROID) 50 MCG tablet Take 1 tablet (50 mcg total) by mouth daily. 06/05/20 12/15/20 Yes Shah, Pratik D, DO  linaclotide (LINZESS) 145 MCG CAPS capsule Take 1 capsule (145 mcg total) by mouth daily. To regulate bowel movements 08/14/20  Yes Stacks, Cletus Gash, MD  metFORMIN (GLUCOPHAGE-XR) 500 MG 24 hr tablet TAKE 1 TABLET BY MOUTH EVERY DAY WITH BREAKFAST Patient taking differently: Take 500 mg by mouth daily with breakfast. 10/01/20  Yes Hawks, Christy A, FNP  mupirocin ointment (BACTROBAN) 2 % Apply 1 application topically 2 (two) times daily as needed (wound care). 09/25/20  Yes [provider]  ondansetron (ZOFRAN-ODT) 4 MG disintegrating tablet TAKE 1 TABLET BY MOUTH EVERY 8 HOURS AS NEEDED FOR NAUSEA AND VOMITING Patient taking differently: Take 4 mg by mouth every 8 (eight) hours as needed for nausea or vomiting. 07/24/20  Yes Hawks, Christy A, FNP  OneTouch Delica Lancets 78M MISC Use to test blood sugars daily as directed. DX: E11.9 10/14/19  Yes Hawks, Christy A, FNP  potassium chloride SA (KLOR-CON) 20 MEQ tablet Take 1 tablet (20 mEq total) by mouth daily. 09/14/20  Yes Hawks, Christy A, FNP  pravastatin (PRAVACHOL) 40 MG tablet TAKE 1 TABLET (40 MG TOTAL) BY MOUTH ON MONDAYS , WEDNESDAYS, AND FRIDAYS AS DIRECTED Patient taking differently: Take 40 mg by mouth  See admin instructions. Take 40 mg on Mon, Wed, Fri 05/11/20  Yes Hawks, Christy A, FNP  Semaglutide (RYBELSUS) 7 MG TABS Take 7 mg by mouth daily. 03/20/20  Yes Hawks, Christy A, FNP  torsemide 40 MG TABS Take 40 mg by mouth daily. 09/12/20 12/11/20 Yes O'Neal, Cassie Freer, MD  zolpidem (AMBIEN) 5 MG tablet Take 5 mg by mouth at bedtime as needed for sleep.   Yes [provider]  torsemide (DEMADEX) 20 MG tablet Take 2 tablets (40 mg total) by mouth daily. Take 40 mg twice daily for 3 days and then resume 40 mg daily. Patient not taking: Reported on 10/10/2020 10/05/20  Geralynn Rile, MD    Physical Exam: Vitals:   10/10/20 0100 10/10/20 0130 10/10/20 0233 10/10/20 0252  BP: 123/62 132/90 123/63 136/60  Pulse: (!) 105 (!) 110 100 (!) 101  Resp: _0 Temp:   97.8 F (36.6 C) 98.2 F (36.8 C)  TempSrc:   Oral Oral  SpO2: 99% 100% 100% 100%    Constitutional: NAD, calm  Eyes: PERTLA, lids and conjunctivae normal ENMT: Mucous membranes are moist. Posterior pharynx clear of any exudate or lesions.   Neck: supple, no masses  Respiratory:  no wheezing, no crackles. No accessory muscle use.  Cardiovascular: S1 & S2 heard, regular rate and rhythm. Pretibial pitting edema bilaterally.  Abdomen: No distension, no tenderness, soft. Bowel sounds active.  Musculoskeletal: no clubbing / cyanosis. No joint deformity upper and lower extremities.   Skin: no significant rashes, lesions, ulcers. Warm, dry, well-perfused. Neurologic: CN 2-12 grossly intact. Sensation intact. Moving all extremities.  Psychiatric: Sleeping, wakes to voice. Oriented to person, place, and situation.      Labs and Imaging on Admission: I have personally reviewed following labs and imaging studies  CBC: Recent Labs  Lab 10/09/20 2212  WBC 7.4  NEUTROABS 5.4  HGB 6.3*  HCT 22.5*  MCV 90.0  PLT 876   Basic Metabolic Panel: Recent Labs  Lab 10/09/20 1127 10/09/20 2212  NA 136 138  K 4.0 3.5   CL 94* 97*  CO2 28 31  GLUCOSE 135* 133*  BUN 18 19  CREATININE 0.90 0.74  CALCIUM 8.4* 9.3   GFR: Estimated Creatinine Clearance: 71.8 mL/min (by C-G formula based on SCr of 0.74 mg/dL). Liver Function Tests: Recent Labs  Lab 10/09/20 1127 10/09/20 2212  AST 16 15  ALT 10 10  ALKPHOS 132* 111  BILITOT 0.2 0.4  PROT 5.9* 6.5  ALBUMIN 3.6* 3.4*   No results for input(s): LIPASE, AMYLASE in the last 168 hours. No results for input(s): AMMONIA in the last 168 hours. Coagulation Profile: No results for input(s): INR, PROTIME in the last 168 hours. Cardiac Enzymes: No results for input(s): CKTOTAL, CKMB, CKMBINDEX, TROPONINI in the last 168 hours. BNP (last 3 results) No results for input(s): PROBNP in the last 8760 hours. HbA1C: No results for input(s): HGBA1C in the last 72 hours. CBG: No results for input(s): GLUCAP in the last 168 hours. Lipid Profile: No results for input(s): CHOL, HDL, LDLCALC, TRIG, CHOLHDL, LDLDIRECT in the last 72 hours. Thyroid Function Tests: No results for input(s): TSH, T4TOTAL, FREET4, T3FREE, THYROIDAB in the last 72 hours. Anemia Panel: No results for input(s): VITAMINB12, FOLATE, FERRITIN, TIBC, IRON, RETICCTPCT in the last 72 hours. Urine analysis:    Component Value Date/Time   COLORURINE STRAW (A) 08/14/2020 1122   APPEARANCEUR Clear 09/13/2020 1132   LABSPEC 1.008 08/14/2020 1122   PHURINE 7.0 08/14/2020 1122   GLUCOSEU Negative 09/13/2020 1132   HGBUR NEGATIVE 08/14/2020 1122   BILIRUBINUR Negative 09/13/2020 1132   KETONESUR NEGATIVE 08/14/2020 1122   PROTEINUR 2+ (A) 09/13/2020 1132   PROTEINUR NEGATIVE 08/14/2020 1122   NITRITE Negative 09/13/2020 1132   NITRITE NEGATIVE 08/14/2020 1122   LEUKOCYTESUR Trace (A) 09/13/2020 1132   LEUKOCYTESUR NEGATIVE 08/14/2020 1122   Sepsis Labs: _1 (procalcitonin:4,lacticidven:4) ) Recent Results (from the past 240 hour(s))  Resp Panel by RT-PCR (Flu A&B, Covid)  Nasopharyngeal Swab     Status: None   Collection Time: 10/09/20 11:20 PM   Specimen: Nasopharyngeal Swab; Nasopharyngeal(NP) swabs in vial  transport medium  Result Value Ref Range Status   SARS Coronavirus 2 by RT PCR NEGATIVE NEGATIVE Final    Comment: (NOTE) SARS-CoV-2 target nucleic acids are NOT DETECTED.  The SARS-CoV-2 RNA is generally detectable in upper respiratory specimens during the acute phase of infection. The lowest concentration of SARS-CoV-2 viral copies this assay can detect is 138 copies/mL. A negative result does not preclude SARS-Cov-2 infection and should not be used as the sole basis for treatment or other patient management decisions. A negative result may occur with  improper specimen collection/handling, submission of specimen other than nasopharyngeal swab, presence of viral mutation(s) within the areas targeted by this assay, and inadequate number of viral copies(<138 copies/mL). A negative result must be combined with clinical observations, patient history, and epidemiological information. The expected result is Negative.  Fact Sheet for Patients:  EntrepreneurPulse.com.au  Fact Sheet for Healthcare Providers:  IncredibleEmployment.be  This test is no t yet approved or cleared by the [place] FDA and  has been authorized for detection and/or diagnosis of SARS-CoV-2 by FDA under an Emergency Use Authorization (EUA). This EUA will remain  in effect (meaning this test can be used) for the duration of the COVID-19 declaration under Section 564(b)(1) of the Act, 21 U.S.C.section 360bbb-3(b)(1), unless the authorization is terminated  or revoked sooner.       Influenza A by PCR NEGATIVE NEGATIVE Final   Influenza B by PCR NEGATIVE NEGATIVE Final    Comment: (NOTE) The Xpert Xpress SARS-CoV-2/FLU/RSV plus assay is intended as an aid in the diagnosis of influenza from Nasopharyngeal swab specimens and should not be  used as a sole basis for treatment. Nasal washings and aspirates are unacceptable for Xpert Xpress SARS-CoV-2/FLU/RSV testing.  Fact Sheet for Patients: EntrepreneurPulse.com.au  Fact Sheet for Healthcare Providers: IncredibleEmployment.be  This test is not yet approved or cleared by the [place] FDA and has been authorized for detection and/or diagnosis of SARS-CoV-2 by FDA under an Emergency Use Authorization (EUA). This EUA will remain in effect (meaning this test can be used) for the duration of the COVID-19 declaration under Section 564(b)(1) of the Act, 21 U.S.C. section 360bbb-3(b)(1), unless the authorization is terminated or revoked.  Performed at [facility], [address] [place], [place] [address]      Radiological Exams on Admission: DG Chest 2 View  Result Date: 10/09/2020 CLINICAL DATA:  Worsening leg edema with history of congestive heart failure. EXAM: CHEST - 2 VIEW COMPARISON:  09/13/2020 FINDINGS: Heart size and pulmonary vascularity are normal. Emphysematous changes and fibrosis in the lungs. No evidence of airspace disease or focal consolidation. Calcification in the aorta and mitral valve. Power port type central venous catheter with tip over the low SVC region. No pleural effusions. No pneumothorax. Mediastinal contours appear intact. Degenerative changes in the spine and shoulders. IMPRESSION: Emphysematous and chronic bronchitic changes in the lungs. No evidence of active pulmonary disease. Electronically Signed   By: Lucienne Capers M.D.   On: 10/09/2020 22:01   CT Abdomen Pelvis W Contrast  Result Date: 10/10/2020 CLINICAL DATA:  Leg swelling, fatigue, hypotension, and anemia following cardioversion. EXAM: CT ABDOMEN AND PELVIS WITH CONTRAST TECHNIQUE: Multidetector CT imaging of the abdomen and pelvis was performed using the standard protocol following bolus administration of intravenous contrast.  CONTRAST:  139m OMNIPAQUE IOHEXOL 300 MG/ML  SOLN COMPARISON:  09/05/2020 FINDINGS: Lower chest: Lung bases are clear. Hepatobiliary: Mildly nodular hepatic contour, raising the possibility of early cirrhosis. A few calcified  hepatic granulomata, benign. No focal hepatic lesion is seen. Gallbladder is unremarkable. No intrahepatic or extrahepatic duct dilatation. Pancreas: Within normal limits. Spleen: Within normal limits. Adrenals/Urinary Tract: Adrenal glands are within normal limits. 4.0 cm posterior interpolar right renal cyst (series 2/image 29). Left kidney is within normal limits. No hydronephrosis. Bladder is within normal limits. Stomach/Bowel: Stomach is within normal limits. No evidence of bowel obstruction. Normal appendix (series 2/image 45). Sigmoid diverticulosis, without evidence of diverticulitis. Vascular/Lymphatic: No evidence of abdominal aortic aneurysm. Atherosclerotic calcifications of the abdominal aorta and branch vessels. No suspicious abdominopelvic lymphadenopathy. Reproductive: Status post TURP. Other: No abdominopelvic ascites. Postprocedural changes in the right groin with stable 4.2 cm seroma (series 2/image 69). Musculoskeletal: Degenerative changes of the visualized thoracolumbar spine. IMPRESSION: Postprocedural changes in the right groin with stable seroma. Mildly nodular hepatic contour, at least raising the possibility of early cirrhosis. Additional stable ancillary findings as above. Electronically Signed   By: Julian Hy M.D.   On: 10/10/2020 00:02    EKG: Independently reviewed. Sinus tachycardia, rate 112, PAC, minimal ST-depression.   Assessment/Plan   1. Symptomatic anemia  - Presents with increased fatigue and lightheadedness and is found to have Hgb of 6.3, down from 9.7 on 09/13/20   - He denies bleeding and FOBT is negative in ED  - Recent EGD with tiny non-bleeding gastric ulcer though anemia at that time was suspected secondary to iron-deficiency and  postoperative blood-loss  - He has not been taking PPI as prescribed and stopped iron due to constipation  - He is being transfused with 2 units RBC ordered from ED  - Check post-transfusion H&H, resume PPI and iron   2. Chronic diastolic CHF  - EF was preserved in May 2022, BNP is 358 in ED  - Appears compensated, lungs clear   - Pt reports decreased weight and increased leg swelling recently  - Continue torsemide, give 20 mg IV Lasix between units of RBC   3. Paroxysmal atrial fibrillation  - Status-post electrical cardioversion 10/05/20  - In sinus rhythm on admission  - Continue Eliquis    4. Non-small cell lung cancer  - Just completed radiation for recurrence and is due to follow-back up with medical oncology for repeat scan in ~2 months per pt report   5. Type II DM  - A1c was 7.5% in May 2022  - Check CBGs and use low-intensity SSI for now    6. Hypertension  - BP at goal  - Continue clonidine  7. Hypothyroidism  - Continue Synthroid    8. COPD, OSA, chronic hypoxic respiratory failure  - Appears stable, no recent cough or wheezing  - Continue supplemental O2, CPAP qHS, ICS/LABA/LAMA, and as-needed SAMA    9. PAD - Status-post right femoral endarterectomy and right common iliac stent on 08/20/20  - Continue Plavix and statin   10. Elevated troponin  - HS troponin 62 in ED  - No chest pain, likely demand ischemia in setting of severe anemia  - Transfuse RBCs as above, trend troponin    DVT prophylaxis: Eliquis  Code Status: Full   Level of Care: Level of care: Telemetry Medical Family Communication: HCPOA updated at bedside Disposition Plan:  Patient is from: Home  Anticipated d/c is to: Home  Anticipated d/c date is: 10/11/20  Patient currently: Pending post-transfusion H&H, stable blood counts  Consults called: None  Admission status: Observation     Vianne Bulls, MD [facility]  10/10/2020, 3:40 AM

## 2020-10-10 NOTE — ED Provider Notes (Addendum)
Patient transferred from [facility] with fatigue, leg swelling, generalized weakness and symptomatic anemia.  No chest pain or shortness of breath.  Recent cardioversion for atrial fibrillation 4 days ago.  Hemoglobin 6.3 at outside facility with Hemoccult negative.  Recent GI work-up was reassuring.  CT scan shows seroma in his groin without evidence of active bleeding.  Blood transfusion ordered.  Remains tachycardic around 100-110.  Stable blood pressure.  Appears to be sinus rhythm.  Admission discussed with Dr. Myna Hidalgo.  CRITICAL CARE Performed by: Ezequiel Essex Total critical care time: 33 minutes Critical care time was exclusive of separately billable procedures and treating other patients. Critical care was necessary to treat or prevent imminent or life-threatening deterioration. Critical care was time spent personally by me on the following activities: development of treatment plan with patient and/or surrogate as well as nursing, discussions with consultants, evaluation of patient's response to treatment, examination of patient, obtaining history from patient or surrogate, ordering and performing treatments and interventions, ordering and review of laboratory studies, ordering and review of radiographic studies, pulse oximetry and re-evaluation of patient's condition.    Ezequiel Essex, MD 10/10/20 [place]    Ezequiel Essex, MD 10/10/20 2017

## 2020-10-10 NOTE — ED Notes (Signed)
Pt has been transferred to ED [facility] via [facility] - pt is stable

## 2020-10-10 NOTE — Discharge Planning (Signed)
Pt currently active with [facility] for [facility] services as confirmed by [facility] with Ramond Marrow of [facility].  Pt will resume [place] services of nursing.

## 2020-10-10 NOTE — Evaluation (Signed)
Physical Therapy Evaluation Patient Details Name: John [name] MRN: [id number] DOB: [birth year] Today's Date: 10/10/2020   History of Present Illness  Pt is 75 yo male who presented on 10/09/20 with fatigue and lightheadedness.  Pt found to have hgb of 6.3 and admitted with symptomatic anemia. Pt with hx including non-small cell lung cancer with recurrence (recently completed radiation), chronic diastolic CHF, COPD, OSA, chronic hypoxic respiratory failure, hypothyroidism, type 2 diabetes mellitus, paroxysmal atrial fibrillation on Eliquis, recent electrical cardioversion, PAD with right femoral endarterectomy and common iliac stenting in May 2022  Clinical Impression  Pt admitted with above diagnosis and has received 2 units of PRBC but lab values have not returned yet on repeat hgb.  RN reports she ambulated pt earlier without issue.  At baseline, pt is independent with ADLs, IADLs, and ambulates with cane.  Today, pt was able to ambulate 300' without a cane and was stable.  All VSS and pt had no lightheadedness and reports feeling much better.  Pt is near baseline and has no further acute PT needs. Discussed with pt and encouraged gradual increase in endurance with return home.     Follow Up Recommendations No PT follow up    Equipment Recommendations  None recommended by PT    Recommendations for Other Services       Precautions / Restrictions Precautions Precautions: None      Mobility  Bed Mobility   Bed Mobility: Rolling;Supine to Sit;Sit to Supine Rolling: Independent   Supine to sit: Supervision Sit to supine: Supervision   General bed mobility comments: had supervision for safety    Transfers Overall transfer level: Needs assistance Equipment used: None Transfers: Sit to/from Stand Sit to Stand: Supervision         General transfer comment: Supervision for safety.  Stood from Radio broadcast assistant.  Pt performed toileting ADLs with supervision for lines.  Pt had  1 mild LOB recovering independently - LOB was associated with pt becoming frustrated with hospital gown and lines/leads  Ambulation/Gait Ambulation/Gait assistance: Supervision Gait Distance (Feet): 300 Feet Assistive device: None Gait Pattern/deviations: Step-through pattern Gait velocity: normal   General Gait Details: steady gait without LOB; VSS  Stairs            Wheelchair Mobility    Modified Rankin (Stroke Patients Only)       Balance Overall balance assessment: Needs assistance   Sitting balance-Leahy Scale: Normal Sitting balance - Comments: Pt able to don shorts and under sitting on EOB   Standing balance support: No upper extremity supported Standing balance-Leahy Scale: Good Standing balance comment: Overall standing balance good.  Pt ambulating and performing ADLs in standing without UE support and was steady.  He does use a cane at home but is was not available.  Pt had 1 LOB in standing when he was frustrated with gown/lines/leads and pulling to untangle - he recovered with a step back                             Pertinent Vitals/Pain Pain Assessment: No/denies pain    Home Living Family/patient expects to be discharged to:: Private residence Living Arrangements: Spouse/significant other Available Help at Discharge: Family;Available PRN/intermittently (lives with wife who works 40 hr week) Type of Home: House Home Access: Stairs to enter Entrance Stairs-Rails: Psychiatric nurse of Steps: 2 [place]: One level Home Equipment: Environmental consultant - 2 wheels;Cane - single point;Shower seat  Prior Function Level of Independence: Needs assistance   Gait / Transfers Assistance Needed: Walks with cane; could ambulate in community but does not do much due to [place] / Homemaking Assistance Needed: Pt independent with ADLs and some IADLS; does drive        Hand Dominance        Extremity/Trunk Assessment   Upper  Extremity Assessment Upper Extremity Assessment: Overall WFL for tasks assessed    Lower Extremity Assessment Lower Extremity Assessment: Overall WFL for tasks assessed    Cervical / Trunk Assessment Cervical / Trunk Assessment: Normal  Communication   Communication: HOH  Cognition Arousal/Alertness: Awake/alert Behavior During Therapy: WFL for tasks assessed/performed Overall Cognitive Status: Within Functional Limits for tasks assessed                                 General Comments: mild decreased safety associated with lines/leads      General Comments General comments (skin integrity, edema, etc.): VSS on RA; no lightheadedness or dizziness    Exercises     Assessment/Plan    PT Assessment Patent does not need any further PT services  PT Problem List         PT Treatment Interventions      PT Goals (Current goals can be found in the Care Plan section)  Acute Rehab PT Goals Patient Stated Goal: go home PT Goal Formulation: All assessment and education complete, DC therapy    Frequency     Barriers to discharge        Co-evaluation               AM-PAC PT "6 Clicks" Mobility  Outcome Measure Help needed turning from your back to your side while in a flat bed without using bedrails?: None Help needed moving from lying on your back to sitting on the side of a flat bed without using bedrails?: None Help needed moving to and from a bed to a chair (including a wheelchair)?: None Help needed standing up from a chair using your arms (e.g., wheelchair or bedside chair)?: A Little Help needed to walk in hospital room?: A Little Help needed climbing 3-5 steps with a railing? : A Little 6 Click Score: 21    End of Session Equipment Utilized During Treatment: Gait belt Activity Tolerance: Patient tolerated treatment well Patient left: in bed;with call bell/phone within reach;with family/visitor present (ED on stretcher) Nurse Communication:  Mobility status      Time: 1130-1150 PT Time Calculation (min) (ACUTE ONLY): 20 min   Charges:   PT Evaluation $PT Eval Low Complexity: 1 Low          Marlon Suleiman, PT Acute Rehab Services Pager [phone] [facility] [phone]   Karlton Lemon 10/10/2020, 12:40 PM

## 2020-10-10 NOTE — ED Triage Notes (Signed)
Pt here from [place] for blood transfusion. Last hemoglobin 6.3. Pt pale and lethargic on triage. A&O x4

## 2020-10-10 NOTE — ED Notes (Signed)
Pt utilized condom catheter to urinate.

## 2020-10-10 NOTE — ED Notes (Signed)
Pt ambulated in hallway per request, because he was "stiff".  Denied sob.  Saturations did not drop.  HR remained between 100 - 110.

## 2020-10-10 NOTE — ED Notes (Signed)
Pt refused breakfast, stating he was not hungry.  Given ginger ale per request.

## 2020-10-10 NOTE — Discharge Summary (Signed)
Physician Discharge Summary  John [name] LKG:[id number] DOB: 12/14/1945 DOA: 10/09/2020  PCP: [name], FNP  Admit date: 10/09/2020 Discharge date: 10/10/2020  Admitted From: Home Disposition: Home  Recommendations for Outpatient Follow-up:  Follow ups as below. Please obtain CBC/BMP/Mag at follow up Please follow up on the following pending results: None  Home Health: [facility] RN Equipment/Devices: None  Discharge Condition: Stable CODE STATUS: Full code   Follow-up Information     [name], FNP. Schedule an appointment as soon as possible for a visit in 1 week(s).   Specialty: Family Medicine Contact information: [place] [address] [phone]                  Hospital Course: 75 year old M with PMH of recurrent NSCLC, diastolic CHF, COPD, OSA, chronic RF, DM-2, paroxysmal A. fib on Eliquis and PAD with right ACA and common iliac stenting in May 2022, and recent admission about a month ago with symptomatic anemia presenting with fatigue and lightheadedness and admitted with symptomatic anemia with hemoglobin down to 6.3 from a baseline of 9.7.  Patient denies melena or hematochezia. Hemoccult was negative.   Patient had EGD last month that showed tiny nonbleeding gastric ulcer, and he was recommended PPI indefinitely.  However, patient has not been taking PPI or iron.  CXR without acute finding.  CT abdomen and pelvis with a stable postoperative seroma in the right groin and mildly nodular liver contour suggestive for early cirrhosis.  Patient was transfused 2 units with appropriate response.  Symptoms improved and he felt ready to go home.  Patient was encouraged to take his iron tablets and PPI as recommended.  Also encouraged to follow-up with PCP in 1 to 2 weeks to have CBC rechecked.  Patient was ambulated by therapy, and no needles identified.  See individual problem list below for more on hospital course.  Discharge Diagnoses:   Symptomatic iron deficiency anemia: No report of melena or hematochezia.  Hemoccult negative.  On Eliquis and Plavix but not on NSAIDs. Recent Labs    08/21/20 0530 08/30/20 1114 09/05/20 1103 09/05/20 1700 09/05/20 2123 09/06/20 0410 09/10/20 1258 09/13/20 1305 10/09/20 2212 10/10/20 1106  HGB 10.2* 9.4* 8.0* 7.8* 7.6* 8.9* 9.5* 9.7* 6.3* 8.9*  -Transfused 2 units with appropriate response in Hgb and symptoms -Recheck CBC at follow-up   Chronic diastolic CHF: Appears euvolemic on exam. -Continue home medications   Paroxysmal atrial fibrillation: Rate controlled. -Continue home medications   Recurrent NSCLC-reports just completing radiation and outpatient follow-up in 2 months   Uncontrolled DM-2 with hyperglycemia: A1c 7.5% in 08/2020. -Continue home medications   Essential hypertension: Normotensive. -Continue home meds  Chronic COPD, OSA, chronic hypoxic respiratory failure: Stable -Continue home meds.   PAD s/p right FEA and right common iliac artery stent on 08/20/2020 -Continue Plavix and statin   Elevated troponin: Likely demand ischemia from anemia.  No chest pain.  No significant delta. -Managed anemia as above -Continue home cardiac medications  Hypokalemia: K3.2.  Mg within normal. -Received K-Dur 40 mill equivalent x2 prior to discharge.   There is no height or weight on file to calculate BMI.            Discharge Exam: Vitals:   10/10/20 1430 10/10/20 1509  BP:    Pulse: 92   Resp:    Temp:  97.7 F (36.5 C)  SpO2: 100%     GENERAL: No apparent distress.  Nontoxic. HEENT: MMM.  Vision and hearing grossly intact.  NECK: Supple.  No apparent JVD.  RESP: On RA.  No IWOB.  Fair aeration bilaterally. CVS:  RRR. Heart sounds normal.  ABD/GI/GU: Bowel sounds present. Soft. Non tender.  MSK/EXT:  Moves extremities. No apparent deformity. No edema.  SKIN: no apparent skin lesion or wound NEURO: Awake, alert and oriented appropriately.  No  apparent focal neuro deficit. PSYCH: Calm. Normal affect.   Discharge Instructions  Discharge Instructions     (HEART FAILURE PATIENTS) Call MD:  Anytime you have any of the following symptoms: 1) 3 pound weight gain in 24 hours or 5 pounds in 1 week 2) shortness of breath, with or without a dry hacking cough 3) swelling in the hands, feet or stomach 4) if you have to sleep on extra pillows at night in order to breathe.   Complete by: As directed    Call MD for:  difficulty breathing, headache or visual disturbances   Complete by: As directed    Call MD for:  extreme fatigue   Complete by: As directed    Call MD for:  persistant dizziness or light-headedness   Complete by: As directed    Diet - low sodium heart healthy   Complete by: As directed    Diet Carb Modified   Complete by: As directed    Discharge instructions   Complete by: As directed    It has been a pleasure taking care of you!  You were hospitalized with generalized weakness and lightheadedness likely due to anemia.  Your symptoms improved after blood transfusion.  It is very important that you take your iron tablets and Protonix (acid reflux medication) as prescribed.  Please follow-up with your primary care doctor in 1 to 2 weeks to have your hemoglobin rechecked.  You may use stool softener for constipation due to iron.  Please review your new medication list and the directions on your medications before you take them.   Take care,   Increase activity slowly   Complete by: As directed       Allergies as of 10/10/2020       Reactions   Sulfa Antibiotics Swelling   Mouth swelling   Atorvastatin Other (See Comments)   Muscle aches - tolerating Pravastatin 40 mg MWF   Jardiance [empagliflozin] Other (See Comments)   FEELS SLUGGISH, TIRED   Lopressor [metoprolol] Other (See Comments)   Fatigue   Rosuvastatin Other (See Comments)   Muscle aches - tolerating Pravastatin 40 mg MWF   Temazepam Other (See Comments)    Made insomnia worse         Medication List     TAKE these medications    acetaminophen 650 MG CR tablet Commonly known as: TYLENOL Take 650 mg by mouth every 8 (eight) hours as needed for pain. What changed: Another medication with the same name was removed. Continue taking this medication, and follow the directions you see here.   albuterol 108 (90 Base) MCG/ACT inhaler Commonly known as: VENTOLIN HFA Inhale 2 puffs into the lungs every 4 (four) hours as needed for wheezing or shortness of breath.   apixaban 5 MG Tabs tablet Commonly known as: ELIQUIS Take 1 tablet (5 mg total) by mouth 2 (two) times daily.   cloNIDine 0.1 MG tablet Commonly known as: CATAPRES TAKE 1 TABLET (0.1 MG TOTAL) BY MOUTH 3 (THREE) TIMES DAILY.   clopidogrel 75 MG tablet Commonly known as: PLAVIX Take 75 mg by mouth daily.  dutasteride 0.5 MG capsule Commonly known as: AVODART TAKE 1 CAPSULE BY MOUTH EVERY DAY What changed: how much to take   famotidine 10 MG tablet Commonly known as: PEPCID Take 10 mg by mouth daily as needed for heartburn or indigestion.   fluticasone 50 MCG/ACT nasal spray Commonly known as: FLONASE Place 1 spray into both nostrils daily as needed for allergies or rhinitis.   gabapentin 300 MG capsule Commonly known as: NEURONTIN TAKE 1 CAPSULE BY MOUTH THREE TIMES A DAY What changed: See the new instructions.   Iron (Ferrous Sulfate) 325 (65 Fe) MG Tabs Take 325 mg by mouth 2 (two) times daily.   levothyroxine 50 MCG tablet Commonly known as: SYNTHROID Take 1 tablet (50 mcg total) by mouth daily.   linaclotide 145 MCG Caps capsule Commonly known as: Linzess Take 1 capsule (145 mcg total) by mouth daily. To regulate bowel movements   metFORMIN 500 MG 24 hr tablet Commonly known as: GLUCOPHAGE-XR TAKE 1 TABLET BY MOUTH EVERY DAY WITH BREAKFAST What changed: See the new instructions.   mupirocin ointment 2 % Commonly known as: BACTROBAN Apply 1  application topically 2 (two) times daily as needed (wound care).   ondansetron 4 MG disintegrating tablet Commonly known as: ZOFRAN-ODT TAKE 1 TABLET BY MOUTH EVERY 8 HOURS AS NEEDED FOR NAUSEA AND VOMITING What changed: See the new instructions.   OneTouch Delica Lancets 71I Misc Use to test blood sugars daily as directed. DX: E11.9   OneTouch Verio Reflect w/Device Kit Use to test blood sugars daily as directed. DX: E11.9   OneTouch Verio test strip Generic drug: glucose blood Use to test blood sugars daily as directed. DX: E11.9   pantoprazole 40 MG tablet Commonly known as: Protonix Take 1 tablet (40 mg total) by mouth daily.   potassium chloride SA 20 MEQ tablet Commonly known as: KLOR-CON Take 1 tablet (20 mEq total) by mouth daily.   pravastatin 40 MG tablet Commonly known as: PRAVACHOL TAKE 1 TABLET (40 MG TOTAL) BY MOUTH ON MONDAYS , WEDNESDAYS, AND FRIDAYS AS DIRECTED What changed: See the new instructions.   Rybelsus 7 MG Tabs Generic drug: Semaglutide Take 7 mg by mouth daily.   senna-docusate 8.6-50 MG tablet Commonly known as: Senokot-S Take 1 tablet by mouth 2 (two) times daily between meals as needed for mild constipation.   Torsemide 40 MG Tabs Take 40 mg by mouth daily. What changed: Another medication with the same name was removed. Continue taking this medication, and follow the directions you see here.   Trelegy Ellipta 100-62.5-25 MCG/INH Aepb Generic drug: Fluticasone-Umeclidin-Vilant Inhale 1 puff into the lungs daily.   zolpidem 5 MG tablet Commonly known as: AMBIEN Take 5 mg by mouth at bedtime as needed for sleep.        Consultations: None  Procedures/Studies  DG Chest 2 View  Result Date: 10/09/2020 CLINICAL DATA:  Worsening leg edema with history of congestive heart failure. EXAM: CHEST - 2 VIEW COMPARISON:  09/13/2020 FINDINGS: Heart size and pulmonary vascularity are normal. Emphysematous changes and fibrosis in the lungs.  No evidence of airspace disease or focal consolidation. Calcification in the aorta and mitral valve. Power port type central venous catheter with tip over the low SVC region. No pleural effusions. No pneumothorax. Mediastinal contours appear intact. Degenerative changes in the spine and shoulders. IMPRESSION: Emphysematous and chronic bronchitic changes in the lungs. No evidence of active pulmonary disease. Electronically Signed   By: Lucienne Capers M.D.   On:  10/09/2020 22:01   DG Chest 2 View  Result Date: 09/16/2020 CLINICAL DATA:  COPD, history non-small cell lung cancer EXAM: CHEST - 2 VIEW COMPARISON:  09/05/2020 Correlation: PET-CT 08/13/2020 FINDINGS: LEFT subclavian Port-A-Cath with tip projecting over SVC. Normal heart size, mediastinal contours, and pulmonary vascularity. RIGHT hilar fullness seen on previous exam no longer identified. Atherosclerotic calcification aorta. Emphysematous changes without pulmonary infiltrate, pleural effusion, or pneumothorax. Persistent vague density RIGHT upper lobe 11 mm diameter, corresponding to a noncalcified nodule on CT. Bones demineralized with scattered degenerative disc disease changes thoracic spine. IMPRESSION: COPD changes without acute infiltrate. 11 mm nodular density RIGHT upper lobe again seen, known tumor. Aortic Atherosclerosis (ICD10-I70.0) and Emphysema (ICD10-J43.9). Electronically Signed   By: Lavonia Dana M.D.   On: 09/16/2020 09:57   CT Abdomen Pelvis W Contrast  Result Date: 10/10/2020 CLINICAL DATA:  Leg swelling, fatigue, hypotension, and anemia following cardioversion. EXAM: CT ABDOMEN AND PELVIS WITH CONTRAST TECHNIQUE: Multidetector CT imaging of the abdomen and pelvis was performed using the standard protocol following bolus administration of intravenous contrast. CONTRAST:  140m OMNIPAQUE IOHEXOL 300 MG/ML  SOLN COMPARISON:  09/05/2020 FINDINGS: Lower chest: Lung bases are clear. Hepatobiliary: Mildly nodular hepatic contour,  raising the possibility of early cirrhosis. A few calcified hepatic granulomata, benign. No focal hepatic lesion is seen. Gallbladder is unremarkable. No intrahepatic or extrahepatic duct dilatation. Pancreas: Within normal limits. Spleen: Within normal limits. Adrenals/Urinary Tract: Adrenal glands are within normal limits. 4.0 cm posterior interpolar right renal cyst (series 2/image 29). Left kidney is within normal limits. No hydronephrosis. Bladder is within normal limits. Stomach/Bowel: Stomach is within normal limits. No evidence of bowel obstruction. Normal appendix (series 2/image 45). Sigmoid diverticulosis, without evidence of diverticulitis. Vascular/Lymphatic: No evidence of abdominal aortic aneurysm. Atherosclerotic calcifications of the abdominal aorta and branch vessels. No suspicious abdominopelvic lymphadenopathy. Reproductive: Status post TURP. Other: No abdominopelvic ascites. Postprocedural changes in the right groin with stable 4.2 cm seroma (series 2/image 69). Musculoskeletal: Degenerative changes of the visualized thoracolumbar spine. IMPRESSION: Postprocedural changes in the right groin with stable seroma. Mildly nodular hepatic contour, at least raising the possibility of early cirrhosis. Additional stable ancillary findings as above. Electronically Signed   By: SJulian HyM.D.   On: 10/10/2020 00:02       The results of significant diagnostics from this hospitalization (including imaging, microbiology, ancillary and laboratory) are listed below for reference.     Microbiology: Recent Results (from the past 240 hour(s))  Resp Panel by RT-PCR (Flu A&B, Covid) Nasopharyngeal Swab     Status: None   Collection Time: 10/09/20 11:20 PM   Specimen: Nasopharyngeal Swab; Nasopharyngeal(NP) swabs in vial transport medium  Result Value Ref Range Status   SARS Coronavirus 2 by RT PCR NEGATIVE NEGATIVE Final    Comment: (NOTE) SARS-CoV-2 target nucleic acids are NOT  DETECTED.  The SARS-CoV-2 RNA is generally detectable in upper respiratory specimens during the acute phase of infection. The lowest concentration of SARS-CoV-2 viral copies this assay can detect is 138 copies/mL. A negative result does not preclude SARS-Cov-2 infection and should not be used as the sole basis for treatment or other patient management decisions. A negative result may occur with  improper specimen collection/handling, submission of specimen other than nasopharyngeal swab, presence of viral mutation(s) within the areas targeted by this assay, and inadequate number of viral copies(<138 copies/mL). A negative result must be combined with clinical observations, patient history, and epidemiological information. The expected result is Negative.  Fact Sheet for Patients:  EntrepreneurPulse.com.au  Fact Sheet for Healthcare Providers:  IncredibleEmployment.be  This test is no t yet approved or cleared by the [place] FDA and  has been authorized for detection and/or diagnosis of SARS-CoV-2 by FDA under an Emergency Use Authorization (EUA). This EUA will remain  in effect (meaning this test can be used) for the duration of the COVID-19 declaration under Section 564(b)(1) of the Act, 21 U.S.C.section 360bbb-3(b)(1), unless the authorization is terminated  or revoked sooner.       Influenza A by PCR NEGATIVE NEGATIVE Final   Influenza B by PCR NEGATIVE NEGATIVE Final    Comment: (NOTE) The Xpert Xpress SARS-CoV-2/FLU/RSV plus assay is intended as an aid in the diagnosis of influenza from Nasopharyngeal swab specimens and should not be used as a sole basis for treatment. Nasal washings and aspirates are unacceptable for Xpert Xpress SARS-CoV-2/FLU/RSV testing.  Fact Sheet for Patients: EntrepreneurPulse.com.au  Fact Sheet for Healthcare Providers: IncredibleEmployment.be  This test is not yet  approved or cleared by the [place] FDA and has been authorized for detection and/or diagnosis of SARS-CoV-2 by FDA under an Emergency Use Authorization (EUA). This EUA will remain in effect (meaning this test can be used) for the duration of the COVID-19 declaration under Section 564(b)(1) of the Act, 21 U.S.C. section 360bbb-3(b)(1), unless the authorization is terminated or revoked.  Performed at [facility], [address], [place], [place] [address]      Labs:  CBC: Recent Labs  Lab 10/09/20 2212 10/10/20 1106  WBC 7.4  --   NEUTROABS 5.4  --   HGB 6.3* 8.9*  HCT 22.5* 30.1*  MCV 90.0  --   PLT 353  --    BMP &GFR Recent Labs  Lab 10/09/20 1127 10/09/20 2212 10/10/20 0426 10/10/20 1103  NA 136 138 138  --   K 4.0 3.5 3.2*  --   CL 94* 97* 100  --   CO2 28 31 33*  --   GLUCOSE 135* 133* 129*  --   BUN _0 --   CREATININE 0.90 0.74 0.73  --   CALCIUM 8.4* 9.3 8.6*  --   MG  --   --   --  1.9   Estimated Creatinine Clearance: 71.8 mL/min (by C-G formula based on SCr of 0.73 mg/dL). Liver & Pancreas: Recent Labs  Lab 10/09/20 1127 10/09/20 2212  AST 16 15  ALT 10 10  ALKPHOS 132* 111  BILITOT 0.2 0.4  PROT 5.9* 6.5  ALBUMIN 3.6* 3.4*   No results for input(s): LIPASE, AMYLASE in the last 168 hours. No results for input(s): AMMONIA in the last 168 hours. Diabetic: No results for input(s): HGBA1C in the last 72 hours. Recent Labs  Lab 10/10/20 0822  GLUCAP 141*   Cardiac Enzymes: No results for input(s): CKTOTAL, CKMB, CKMBINDEX, TROPONINI in the last 168 hours. No results for input(s): PROBNP in the last 8760 hours. Coagulation Profile: No results for input(s): INR, PROTIME in the last 168 hours. Thyroid Function Tests: No results for input(s): TSH, T4TOTAL, FREET4, T3FREE, THYROIDAB in the last 72 hours. Lipid Profile: No results for input(s): CHOL, HDL, LDLCALC, TRIG, CHOLHDL, LDLDIRECT in the last 72  hours. Anemia Panel: No results for input(s): VITAMINB12, FOLATE, FERRITIN, TIBC, IRON, RETICCTPCT in the last 72 hours. Urine analysis:    Component Value Date/Time   COLORURINE STRAW (A) 08/14/2020 1122   APPEARANCEUR Clear 09/13/2020 1132   LABSPEC  1.008 08/14/2020 1122   PHURINE 7.0 08/14/2020 1122   GLUCOSEU Negative 09/13/2020 1132   HGBUR NEGATIVE 08/14/2020 1122   BILIRUBINUR Negative 09/13/2020 [place] 08/14/2020 1122   PROTEINUR 2+ (A) 09/13/2020 1132   PROTEINUR NEGATIVE 08/14/2020 1122   NITRITE Negative 09/13/2020 1132   NITRITE NEGATIVE 08/14/2020 1122   LEUKOCYTESUR Trace (A) 09/13/2020 1132   LEUKOCYTESUR NEGATIVE 08/14/2020 1122   Sepsis Labs: Invalid input(s): PROCALCITONIN, LACTICIDVEN   Time coordinating discharge: 40 minutes  SIGNED:  Mercy Riding, MD  [facility] 10/10/2020, 6:07 PM  If 7PM-7AM, please contact night-coverage [email or web address]

## 2020-10-11 ENCOUNTER — Telehealth: Payer: Self-pay | Admitting: *Deleted

## 2020-10-11 ENCOUNTER — Telehealth: Payer: Self-pay | Admitting: Family

## 2020-10-11 DIAGNOSIS — I110 Hypertensive heart disease with heart failure: Secondary | ICD-10-CM | POA: Diagnosis not present

## 2020-10-11 DIAGNOSIS — I509 Heart failure, unspecified: Secondary | ICD-10-CM | POA: Diagnosis not present

## 2020-10-11 DIAGNOSIS — I4892 Unspecified atrial flutter: Secondary | ICD-10-CM | POA: Diagnosis not present

## 2020-10-11 DIAGNOSIS — E1151 Type 2 diabetes mellitus with diabetic peripheral angiopathy without gangrene: Secondary | ICD-10-CM | POA: Diagnosis not present

## 2020-10-11 DIAGNOSIS — Z48812 Encounter for surgical aftercare following surgery on the circulatory system: Secondary | ICD-10-CM | POA: Diagnosis not present

## 2020-10-11 DIAGNOSIS — Z95820 Peripheral vascular angioplasty status with implants and grafts: Secondary | ICD-10-CM | POA: Diagnosis not present

## 2020-10-11 LAB — TYPE AND SCREEN
ABO/RH(D): O POS
Antibody Screen: NEGATIVE
Unit division: 0
Unit division: 0

## 2020-10-11 LAB — BPAM RBC
Blood Product Expiration Date: 202208032359
Blood Product Expiration Date: 202208032359
ISSUE DATE / TIME: 202207060225
ISSUE DATE / TIME: 202207060503
Unit Type and Rh: 5100
Unit Type and Rh: 5100

## 2020-10-11 MED ORDER — LEVOTHYROXINE SODIUM 50 MCG PO TABS: 50.0000 ug | ORAL_TABLET | Freq: Every day | ORAL | 1 refills | Status: DC

## 2020-10-11 NOTE — Telephone Encounter (Signed)
Contact Date: 10/11/2020 Contacted By: Lynnea Ferrier, LPN  Transition Care Management Follow-up Telephone Call Date of discharge and from where: 10/10/2020 [facility]  Discharge Diagnosis:Symptomatic iron deficiency anemia: No report of melena or hematochezia How have you been since you were released from the hospital? Still weak and has some Edema. He has upcoming appt with Cardiologist Any questions or concerns? No   Items Reviewed: Did the pt receive and understand the discharge instructions provided? Yes  Medications obtained and verified? Yes  Any new allergies since your discharge? No  Dietary orders reviewed? Yes Do you have support at home? Yes   Discontinued Medications None New Medications Added senna-docusate (SENOKOT-S) 8.6-50 MG tablet   Current Medication List Allergies as of 10/11/2020       Reactions   Sulfa Antibiotics Swelling   Mouth swelling   Atorvastatin Other (See Comments)   Muscle aches - tolerating Pravastatin 40 mg MWF   Jardiance [empagliflozin] Other (See Comments)   FEELS SLUGGISH, TIRED   Lopressor [metoprolol] Other (See Comments)   Fatigue   Rosuvastatin Other (See Comments)   Muscle aches - tolerating Pravastatin 40 mg MWF   Temazepam Other (See Comments)   Made insomnia worse         Medication List        Accurate as of October 11, 2020 11:52 AM. If you have any questions, ask your nurse or doctor.          acetaminophen 650 MG CR tablet Commonly known as: TYLENOL Take 650 mg by mouth every 8 (eight) hours as needed for pain.   albuterol 108 (90 Base) MCG/ACT inhaler Commonly known as: VENTOLIN HFA Inhale 2 puffs into the lungs every 4 (four) hours as needed for wheezing or shortness of breath.   apixaban 5 MG Tabs tablet Commonly known as: ELIQUIS Take 1 tablet (5 mg total) by mouth 2 (two) times daily.   cloNIDine 0.1 MG tablet Commonly known as: CATAPRES TAKE 1 TABLET (0.1 MG TOTAL) BY MOUTH 3 (THREE) TIMES DAILY.    clopidogrel 75 MG tablet Commonly known as: PLAVIX Take 75 mg by mouth daily.   dutasteride 0.5 MG capsule Commonly known as: AVODART TAKE 1 CAPSULE BY MOUTH EVERY DAY   famotidine 10 MG tablet Commonly known as: PEPCID Take 10 mg by mouth daily as needed for heartburn or indigestion.   fluticasone 50 MCG/ACT nasal spray Commonly known as: FLONASE Place 1 spray into both nostrils daily as needed for allergies or rhinitis.   gabapentin 300 MG capsule Commonly known as: NEURONTIN TAKE 1 CAPSULE BY MOUTH THREE TIMES A DAY   Iron (Ferrous Sulfate) 325 (65 Fe) MG Tabs Take 325 mg by mouth 2 (two) times daily.   levothyroxine 50 MCG tablet Commonly known as: SYNTHROID Take 1 tablet (50 mcg total) by mouth daily.   linaclotide 145 MCG Caps capsule Commonly known as: Linzess Take 1 capsule (145 mcg total) by mouth daily. To regulate bowel movements   metFORMIN 500 MG 24 hr tablet Commonly known as: GLUCOPHAGE-XR TAKE 1 TABLET BY MOUTH EVERY DAY WITH BREAKFAST   mupirocin ointment 2 % Commonly known as: BACTROBAN Apply 1 application topically 2 (two) times daily as needed (wound care).   ondansetron 4 MG disintegrating tablet Commonly known as: ZOFRAN-ODT TAKE 1 TABLET BY MOUTH EVERY 8 HOURS AS NEEDED FOR NAUSEA AND VOMITING   OneTouch Delica Lancets 09W Misc Use to test blood sugars daily as directed. DX: E11.9   OneTouch Verio  Reflect w/Device Kit Use to test blood sugars daily as directed. DX: E11.9   OneTouch Verio test strip Generic drug: glucose blood Use to test blood sugars daily as directed. DX: E11.9   pantoprazole 40 MG tablet Commonly known as: Protonix Take 1 tablet (40 mg total) by mouth daily.   potassium chloride SA 20 MEQ tablet Commonly known as: KLOR-CON Take 1 tablet (20 mEq total) by mouth daily.   pravastatin 40 MG tablet Commonly known as: PRAVACHOL TAKE 1 TABLET (40 MG TOTAL) BY MOUTH ON MONDAYS , WEDNESDAYS, AND FRIDAYS AS DIRECTED    Rybelsus 7 MG Tabs Generic drug: Semaglutide Take 7 mg by mouth daily.   senna-docusate 8.6-50 MG tablet Commonly known as: Senokot-S Take 1 tablet by mouth 2 (two) times daily between meals as needed for mild constipation.   Torsemide 40 MG Tabs Take 40 mg by mouth daily.   Trelegy Ellipta 100-62.5-25 MCG/INH Aepb Generic drug: Fluticasone-Umeclidin-Vilant Inhale 1 puff into the lungs daily.   zolpidem 5 MG tablet Commonly known as: AMBIEN Take 5 mg by mouth at bedtime as needed for sleep.         Home Care and Equipment/Supplies: Were home health services ordered? No-Patient already had home health in place If so, what is the name of the agency? [facility]   Has the agency set up a time to come to the patient's home? yes Were any new equipment or medical supplies ordered?  No What is the name of the medical supply agency? NA Were you able to get the supplies/equipment? not applicable Do you have any questions related to the use of the equipment or supplies? No  Functional Questionnaire: (I = Independent and D = Dependent) ADLs: I   Bathing/Dressing- I  Meal Prep- I with the assistance of wife  Eating- I  Maintaining continence- I  Transferring/Ambulation- I with assistance of cane or walker   Managing Meds- I  Follow up appointments reviewed:  PCP Hospital f/u appt confirmed? Yes  Scheduled to see Marjorie Smolder, FNP on 10/18/2020 @ 1:15pm. [facility] f/u appt confirmed? Yes  Scheduled to see Dr. Audie Box on 10/16/2020 @ 1:30pm. Are transportation arrangements needed? No  If their condition worsens, is the pt aware to call PCP or go to the Emergency Dept.? Yes Was the patient provided with contact information for the PCP's office or ED? Yes Was to pt encouraged to call back with questions or concerns? Yes

## 2020-10-11 NOTE — Telephone Encounter (Signed)
Hosp follow up made - [facility]  Discharged 10/10/20. Dx anemia, edema

## 2020-10-11 NOTE — Telephone Encounter (Signed)
I spoke with patient and he states that his BP is 117/ 62 P-116, 118/58 P-115. He wants to know if he should continue taking his clonidine three times a day. His home health nurse is supposed to be out between 1 and 2 today. He also states that he took 80mg  of torsemide today and still has not urinated much. Please review

## 2020-10-12 ENCOUNTER — Encounter: Payer: Self-pay | Admitting: Radiology

## 2020-10-12 NOTE — Telephone Encounter (Signed)
Left message to call back  

## 2020-10-12 NOTE — Telephone Encounter (Signed)
Pt can not just stop clonidine. He can decrease to twice a day. He does need to continue to monitor BP. Hold any BP medication if <110/70. Pt needs hospital follow up with me asap.

## 2020-10-12 NOTE — Telephone Encounter (Signed)
Pt called to see if John [name] got his message that he called about yesterday. Says he needs advise from [place].  Please call patient at [phone]

## 2020-10-13 DIAGNOSIS — I110 Hypertensive heart disease with heart failure: Secondary | ICD-10-CM | POA: Diagnosis not present

## 2020-10-13 DIAGNOSIS — Z48812 Encounter for surgical aftercare following surgery on the circulatory system: Secondary | ICD-10-CM | POA: Diagnosis not present

## 2020-10-13 DIAGNOSIS — I509 Heart failure, unspecified: Secondary | ICD-10-CM | POA: Diagnosis not present

## 2020-10-13 DIAGNOSIS — Z95820 Peripheral vascular angioplasty status with implants and grafts: Secondary | ICD-10-CM | POA: Diagnosis not present

## 2020-10-13 DIAGNOSIS — I4892 Unspecified atrial flutter: Secondary | ICD-10-CM | POA: Diagnosis not present

## 2020-10-13 DIAGNOSIS — E1151 Type 2 diabetes mellitus with diabetic peripheral angiopathy without gangrene: Secondary | ICD-10-CM | POA: Diagnosis not present

## 2020-10-14 NOTE — Progress Notes (Signed)
Cardiology Office Note:   Date:  10/16/2020  NAME:  John [name]    MRN: [id number] DOB:  Aug 15, 1945   PCP:  [name], FNP  Cardiologist:  None  Electrophysiologist:  None   Referring MD: [name], FNP   Chief Complaint  Patient presents with   Follow-up   History of Present Illness:   John [name] is a 75 y.o. male with a hx of non-small cell lung cancer with recurrence, PAD status post right femoral endarterectomy with right common iliac stent, atrial flutter status post cardioversion, diabetes, HFpEF who presents for follow-up.  Recent cardioversion.  Course has been complicated by recurrent anemia with 2 admissions requiring transfusions.  Small gastric ulcer was found.  He was recently admitted again and apparently was anemic again.  Was not taking iron or his PPI.  When he underwent cardioversion on 10/05/2020 he was volume overloaded.  He was instructed to increase his torsemide for 3 days.  He now presents for follow-up today. He reports he is doing well.  Weights are stable.  Still has significant lower extremity edema and JVD.  Recent admission to the hospital for anemia and given 2 units of packed red blood cells.  Possibly was not taking his Protonix.  He denies any significant chest pain.  Still not that active.  Has undergone treatment for his non-small cell lung cancer.  Dietary compliance appears to be an issue.  He was counseled again extensively about salt reductive strategies.  He needs to work on this better.  He does a full panel of labs as well as repeat BMP.  Taking his Eliquis.  No bleeding issues.  He also is on Plavix given recent right common iliac stent.  Plans to follow-up with vascular later this month.  BP 118/72.  His most recent LDL cholesterol not at goal.  He has tried Lipitor and Crestor in the past.  Has been intolerant of these.  He is tolerating pravastatin 3 days/week.  We discussed adding Zetia.  He will try this.  EKGs from the hospital  show he is maintaining sinus rhythm.   Problem List 1. Coronary calcifications 2. Atrial flutter -Dx 08/21/2020 -CHADSVASC=5 (age, DM, PAD, HTN, CHF) -DCCV 10/05/2020 3. PAD -R femoral endarterectomy 08/20/2020 -R common iliac artery stent 08/20/2020 4. Non-small cell lung CA -with recurrence 5. COPD 6. Tobacco abuse 7. Gastric ulcer/Recurrent anemia 8. DM -A1c 7.5 9. HLD -T chol 126, HDL 28, LDL 80, TG 89 -statin intolerant 10. HFpEF  Past Medical History: Past Medical History:  Diagnosis Date   Adenocarcinoma of lung, right ([place]) 04/18/2016   Anxiety    Arthritis    Asthma    BPH (benign prostatic hyperplasia)    with urinary retention 02/06/20   CHF (congestive heart failure) (HCC)    COPD (chronic obstructive pulmonary disease) (HCC)    Depression    Diabetes mellitus without complication ([place])    no meds   Diabetes mellitus, type II ([place])    DM (diabetes mellitus) ([place]) 07/09/2016   Dyspnea    History of kidney stones    History of radiation therapy 06/25/16-08/20/16   right lung   History of radiation therapy 09/17/2020   right lung 09/10/2020-09/17/2020   Dr Sondra Come   Hyperlipidemia    Hypertension    Hypertension    Hypothyroidism    Macular degeneration    Neuropathy    Non-small cell lung cancer, right ([place]) 04/18/2016   Peripheral vascular disease (  [place])    Prostatitis    Pulmonary nodule, left 07/16/2016   Sleep apnea    cpap    Past Surgical History: Past Surgical History:  Procedure Laterality Date   ABDOMINAL AORTOGRAM W/LOWER EXTREMITY Left 02/06/2020   Procedure: ABDOMINAL AORTOGRAM W/LOWER EXTREMITY;  Surgeon: Lorretta Harp, MD;  Location: [facility];  Service: Cardiovascular;  Laterality: Left;   CARDIOVERSION N/A 10/05/2020   Procedure: CARDIOVERSION;  Surgeon: Geralynn Rile, MD;  Location: [facility];  Service: Cardiovascular;  Laterality: N/A;   CATARACT EXTRACTION, BILATERAL Bilateral    ENDARTERECTOMY FEMORAL Right  08/20/2020   Procedure: ENDARTERECTOMY  RIGHT FEMORAL ARTERY;  Surgeon: Marty Heck, MD;  Location: [facility];  Service: Vascular;  Laterality: Right;   ESOPHAGOGASTRODUODENOSCOPY (EGD) WITH PROPOFOL N/A 09/06/2020   Procedure: ESOPHAGOGASTRODUODENOSCOPY (EGD) WITH PROPOFOL;  Surgeon: Irene Shipper, MD;  Location: [facility];  Service: Endoscopy;  Laterality: N/A;   INSERTION OF ILIAC STENT Right 08/20/2020   Procedure: RETROGRADE INSERTION OF RIGHT ILIAC STENT;  Surgeon: Marty Heck, MD;  Location: [facility];  Service: Vascular;  Laterality: Right;   INTRAOPERATIVE ARTERIOGRAM Right 08/20/2020   Procedure: INTRA OPERATIVE ARTERIOGRAM ILIAC;  Surgeon: Marty Heck, MD;  Location: [place] [facility];  Service: Vascular;  Laterality: Right;   PATCH ANGIOPLASTY Right 08/20/2020   Procedure: PATCH ANGIOPLASTY RIGHT FEMORAL ARTERY;  Surgeon: Marty Heck, MD;  Location: [facility];  Service: Vascular;  Laterality: Right;   PORTACATH PLACEMENT Left 06/13/2016   Procedure: INSERTION PORT-A-CATH;  Surgeon: Aviva Signs, MD;  Location: [facility];  Service: General;  Laterality: Left;   TRANSURETHRAL RESECTION OF PROSTATE N/A 05/31/2020   Procedure: TRANSURETHRAL RESECTION OF THE PROSTATE (TURP);  Surgeon: Cleon Gustin, MD;  Location: [facility];  Service: Urology;  Laterality: N/A;   VIDEO BRONCHOSCOPY WITH ENDOBRONCHIAL NAVIGATION N/A 05/28/2016   Procedure: VIDEO BRONCHOSCOPY WITH ENDOBRONCHIAL NAVIGATION;  Surgeon: [name], MD;  Location: [facility];  Service: Thoracic;  Laterality: N/A;   VIDEO BRONCHOSCOPY WITH ENDOBRONCHIAL ULTRASOUND N/A 05/28/2016   Procedure: VIDEO BRONCHOSCOPY WITH ENDOBRONCHIAL ULTRASOUND;  Surgeon: [name], MD;  Location: [facility];  Service: Thoracic;  Laterality: N/A;    Current Medications: Current Meds  Medication Sig   acetaminophen (TYLENOL) 650 MG CR tablet Take 650 mg by mouth every 8 (eight) hours as needed for pain.   albuterol (VENTOLIN HFA) 108  (90 Base) MCG/ACT inhaler Inhale 2 puffs into the lungs every 4 (four) hours as needed for wheezing or shortness of breath.   Blood Glucose Monitoring Suppl (ONETOUCH VERIO REFLECT) w/Device KIT Use to test blood sugars daily as directed. DX: E11.9   cloNIDine (CATAPRES) 0.1 MG tablet TAKE 1 TABLET (0.1 MG TOTAL) BY MOUTH 3 (THREE) TIMES DAILY.   dutasteride (AVODART) 0.5 MG capsule TAKE 1 CAPSULE BY MOUTH EVERY DAY   ezetimibe (ZETIA) 10 MG tablet Take 1 tablet (10 mg total) by mouth daily.   fluticasone (FLONASE) 50 MCG/ACT nasal spray Place 1 spray into both nostrils daily as needed for allergies or rhinitis.   Fluticasone-Umeclidin-Vilant (TRELEGY ELLIPTA) 100-62.5-25 MCG/INH AEPB Inhale 1 puff into the lungs daily.   gabapentin (NEURONTIN) 300 MG capsule TAKE 1 CAPSULE BY MOUTH THREE TIMES A DAY   glucose blood (ONETOUCH VERIO) test strip Use to test blood sugars daily as directed. DX: E11.9   Iron, Ferrous Sulfate, 325 (65 Fe) MG TABS Take 325 mg by mouth 2 (two) times daily.   levothyroxine (SYNTHROID)  50 MCG tablet Take 1 tablet (50 mcg total) by mouth daily.   linaclotide (LINZESS) 145 MCG CAPS capsule Take 1 capsule (145 mcg total) by mouth daily. To regulate bowel movements   metFORMIN (GLUCOPHAGE-XR) 500 MG 24 hr tablet TAKE 1 TABLET BY MOUTH EVERY DAY WITH BREAKFAST   mupirocin ointment (BACTROBAN) 2 % Apply 1 application topically 2 (two) times daily as needed (wound care).   ondansetron (ZOFRAN-ODT) 4 MG disintegrating tablet TAKE 1 TABLET BY MOUTH EVERY 8 HOURS AS NEEDED FOR NAUSEA AND VOMITING   OneTouch Delica Lancets 16R MISC Use to test blood sugars daily as directed. DX: E11.9   pantoprazole (PROTONIX) 40 MG tablet Take 1 tablet (40 mg total) by mouth daily.   potassium chloride SA (KLOR-CON) 20 MEQ tablet Take 1 tablet (20 mEq total) by mouth daily.   pravastatin (PRAVACHOL) 40 MG tablet TAKE 1 TABLET (40 MG TOTAL) BY MOUTH ON MONDAYS , WEDNESDAYS, AND FRIDAYS AS DIRECTED    Semaglutide (RYBELSUS) 7 MG TABS Take 7 mg by mouth daily.   senna-docusate (SENOKOT-S) 8.6-50 MG tablet Take 1 tablet by mouth 2 (two) times daily between meals as needed for mild constipation.   torsemide 40 MG TABS Take 40 mg by mouth daily.   zolpidem (AMBIEN) 5 MG tablet Take 5 mg by mouth at bedtime as needed for sleep.   [DISCONTINUED] apixaban (ELIQUIS) 5 MG TABS tablet Take 1 tablet (5 mg total) by mouth 2 (two) times daily.   [DISCONTINUED] clopidogrel (PLAVIX) 75 MG tablet Take 75 mg by mouth daily.     Allergies:    Sulfa antibiotics, Atorvastatin, Jardiance [empagliflozin], Lopressor [metoprolol], Rosuvastatin, and Temazepam   Social History: Social History   Socioeconomic History   Marital status: Soil scientist    Spouse name: Not on file   Number of children: 5   Years of education: 10   Highest education level: 10th grade  Occupational History   Occupation: Retired  Tobacco Use   Smoking status: Every Day    Packs/day: 0.50    Years: 55.00    Pack years: 27.50    Types: Cigarettes    Start date: 03/11/1961   Smokeless tobacco: Never   Tobacco comments:    1/2 pack to 1 pack per day 12/14/19  Vaping Use   Vaping Use: Never used  Substance and Sexual Activity   Alcohol use: No   Drug use: No   Sexual activity: Yes    Birth control/protection: None  Other Topics Concern   Not on file  Social History Narrative   [name] is his POA and lives with him. Children out of town - one in [facility], others in [place].   Social Determinants of Health   Financial Resource Strain: Low Risk    Difficulty of Paying Living Expenses: Not hard at all  Food Insecurity: No Food Insecurity   Worried About Charity fundraiser in the Last Year: Never true   [place] in the Last Year: Never true  Transportation Needs: No Transportation Needs   Lack of Transportation (Medical): No   Lack of Transportation (Non-Medical): No  Physical Activity: Unknown   Days of  Exercise per Week: 7 days   Minutes of Exercise per Session: Not on file  Stress: No Stress Concern Present   Feeling of Stress : Not at all  Social Connections: Moderately Isolated   Frequency of Communication with Friends and Family: More than three times a week  Frequency of Social Gatherings with Friends and Family: Once a week   Attends Religious Services: Never   Marine scientist or Organizations: No   Attends Music therapist: Never   Marital Status: Living with partner     Family History: The patient's family history includes Diabetes in his father; Heart disease in his father; Hypertension in his mother and sister; Stroke in his father.  ROS:   All other ROS reviewed and negative. Pertinent positives noted in the HPI.     EKGs/Labs/Other Studies Reviewed:   The following studies were personally reviewed by me today:  TTE 08/21/2020  1. Left ventricular ejection fraction, by estimation, is 60 to 65%. The  left ventricle has normal function. The left ventricle has no regional  wall motion abnormalities. Left ventricular diastolic parameters are  indeterminate.   2. Right ventricular systolic function is mildly reduced. The right  ventricular size is normal.   3. The mitral valve is abnormal. No evidence of mitral valve  regurgitation. The mean mitral valve gradient is 4.0 mmHg with average  heart rate of 87 bpm. Moderate mitral annular calcification.   4. The aortic valve is calcified. Aortic valve regurgitation is not  visualized. Mild to moderate aortic valve sclerosis/calcification is  present. Aortic valve area, by VTI measures 0.91 cm. Aortic valve mean  gradient measures 8.0 mmHg. Aortic valve  Vmax measures 1.88 m/s. Aortic valve acceleration time measures 97 msec.  There is discordance between the AVA and gradients; DVI is 0.36 with LV  stroke volume index of 30; Aortic stenosis may be underestimated in this  study. Consider outpatient   monitoring if clinically indicated.   Recent Labs: 08/21/2020: TSH 0.628 10/09/2020: ALT 10; B Natriuretic Peptide 358.0; Platelets 353 10/10/2020: BUN 11; Creatinine, Ser 0.73; Hemoglobin 8.9; Magnesium 1.9; Potassium 3.2; Sodium 138   Recent Lipid Panel    Component Value Date/Time   CHOL 126 08/21/2020 0530   CHOL 136 04/25/2019 1608   TRIG 89 08/21/2020 0530   HDL 28 (L) 08/21/2020 0530   HDL 28 (L) 04/25/2019 1608   CHOLHDL 4.5 08/21/2020 0530   VLDL 18 08/21/2020 0530   LDLCALC 80 08/21/2020 0530   LDLCALC 78 04/25/2019 1608    Physical Exam:   VS:  BP 118/72   Pulse 85   Ht _0  (1.676 m)   Wt 157 lb 12.8 oz (71.6 kg)   BMI 25.47 kg/m    Wt Readings from Last 3 Encounters:  10/16/20 157 lb 12.8 oz (71.6 kg)  10/09/20 158 lb (71.7 kg)  10/05/20 158 lb (71.7 kg)    General: Well nourished, well developed, in no acute distress Head: Atraumatic, normal size  Eyes: PEERLA, EOMI  Neck: Supple, JVD 7 to 10 cm of water Endocrine: No thryomegaly Cardiac: Normal S1, S2; RRR; no murmurs, rubs, or gallops Lungs: Clear to auscultation bilaterally, no wheezing, rhonchi or rales  Abd: Soft, nontender, no hepatomegaly  Ext: 2+ pitting edema up to the mid knees Musculoskeletal: No deformities, BUE and BLE strength normal and equal Skin: Warm and dry, no rashes   Neuro: Alert and oriented to person, place, time, and situation, CNII-XII grossly intact, no focal deficits  Psych: Normal mood and affect   ASSESSMENT:   John [name] is a 75 y.o. male who presents for the following: 1. Chronic diastolic heart failure ([place])   2. Typical atrial flutter ([place])   3. Adequate anticoagulation on anticoagulant therapy   4.  Essential hypertension   5. PAD (peripheral artery disease) ([place])   6. Hyperlipidemia LDL goal <70     PLAN:   1. Chronic diastolic heart failure (HCC) -Still volume up.  Suspect recent transfusions have made this worse.  We will check CMP today as well as BNP.   I would like for him to increase his torsemide to 40 mg twice daily for 5 days.  He will then resume once daily.  He will take potassium supplementation.  BP is stable. -Intolerant of SGLT2 inhibitors. -His main issue appears to be salt consumption.  I have advised him to reduce all of his salt intake.  He will work on this.  2. Typical atrial flutter ([place]) 3. Adequate anticoagulation on anticoagulant therapy -Diagnosed 08/21/2020.  Underwent cardioversion on 10/05/2020.  Course was complicated due to GI bleed.  Back on Eliquis 5 mg twice daily. -CHADSVASC= 5 (age, DM, HTN, HF, PAD) -I have recommended indefinite anticoagulation.  He is maintaining sinus rhythm.  4. Essential hypertension -Well-controlled on current medications.  5. PAD (peripheral artery disease) ([place]) 6. Hyperlipidemia LDL goal <70 -Extensive PAD history.  Status post recent procedure.  Continue Plavix.  Intolerant of Lipitor and Crestor.  He will continue pravastatin 40 mg on Mondays Wednesdays and Fridays.  We will add Zetia 10 mg daily.  Most recent LDL cholesterol 80.  Goal is less than 70.  Disposition: Return in about 6 weeks (around 11/27/2020).  Medication Adjustments/Labs and Tests Ordered: Current medicines are reviewed at length with the patient today.  Concerns regarding medicines are outlined above.  Orders Placed This Encounter  Procedures   Comprehensive metabolic panel   Brain natriuretic peptide    Meds ordered this encounter  Medications   clopidogrel (PLAVIX) 75 MG tablet    Sig: Take 1 tablet (75 mg total) by mouth daily.    Dispense:  90 tablet    Refill:  1   apixaban (ELIQUIS) 5 MG TABS tablet    Sig: Take 1 tablet (5 mg total) by mouth 2 (two) times daily.    Dispense:  60 tablet    Refill:  3   ezetimibe (ZETIA) 10 MG tablet    Sig: Take 1 tablet (10 mg total) by mouth daily.    Dispense:  90 tablet    Refill:  3     Patient Instructions  Medication Instructions:  Start Zetia 10 mg  daily  Take Torsemide 40 mg twice daily for 5 days, then decrease to 40 mg daily  Take your Potassium tablet with this.    *If you need a refill on your cardiac medications before your next appointment, please call your pharmacy*   Lab Work: CMET, BNP today   If you have labs (blood work) drawn today and your tests are completely normal, you will receive your results only by: [place] (if you have MyChart) OR A paper copy in the mail If you have any lab test that is abnormal or we need to change your treatment, we will call you to review the results.  Follow-Up: At [facility], you and your health needs are our priority.  As part of our continuing mission to provide you with exceptional heart care, we have created designated Provider Care Teams.  These Care Teams include your primary Cardiologist (physician) and Advanced Practice Providers (APPs -  Physician Assistants and Nurse Practitioners) who all work together to provide you with the care you need, when you need it.  We recommend  signing up for the patient portal called "MyChart".  Sign up information is provided on this After Visit Summary.  MyChart is used to connect with patients for Virtual Visits (Telemedicine).  Patients are able to view lab/test results, encounter notes, upcoming appointments, etc.  Non-urgent messages can be sent to your provider as well.   To learn more about what you can do with MyChart, go to [email or web address].    Your next appointment:   6 week(s)  The format for your next appointment:   In Person  Provider:   You will see one of the following Advanced Practice Providers on your designated Care Team:   Almyra Deforest, PA-C Sande Rives, [place]    Time Spent with Patient: I have spent a total of 35 minutes with patient reviewing hospital notes, telemetry, EKGs, labs and examining the patient as well as establishing an assessment and plan that was discussed with the patient.  > 50% of time  was spent in direct patient care.  Signed, Addison Naegeli. Audie Box, MD, [place]  [address] [place], [place] [address] [phone]  10/16/2020 1:58 PM

## 2020-10-15 NOTE — Telephone Encounter (Signed)
Returnning nurse call

## 2020-10-15 NOTE — Telephone Encounter (Signed)
Pt informed of Christy's recommendations and understood. He has hospital follow up with Tiffany on 7/14

## 2020-10-16 ENCOUNTER — Encounter: Payer: Self-pay | Admitting: Cardiovascular Disease

## 2020-10-16 ENCOUNTER — Other Ambulatory Visit: Payer: Self-pay

## 2020-10-16 ENCOUNTER — Ambulatory Visit (INDEPENDENT_AMBULATORY_CARE_PROVIDER_SITE_OTHER): Payer: Medicare Other | Admitting: Cardiovascular Disease

## 2020-10-16 VITALS — BP 118/72 | HR 85 | Ht 66.0 in | Wt 157.8 lb

## 2020-10-16 DIAGNOSIS — Z7901 Long term (current) use of anticoagulants: Secondary | ICD-10-CM

## 2020-10-16 DIAGNOSIS — I10 Essential (primary) hypertension: Secondary | ICD-10-CM

## 2020-10-16 DIAGNOSIS — I5032 Chronic diastolic (congestive) heart failure: Secondary | ICD-10-CM | POA: Diagnosis not present

## 2020-10-16 DIAGNOSIS — E785 Hyperlipidemia, unspecified: Secondary | ICD-10-CM

## 2020-10-16 DIAGNOSIS — I739 Peripheral vascular disease, unspecified: Secondary | ICD-10-CM

## 2020-10-16 DIAGNOSIS — I483 Typical atrial flutter: Secondary | ICD-10-CM | POA: Diagnosis not present

## 2020-10-16 MED ORDER — EZETIMIBE 10 MG PO TABS: 10.0000 mg | ORAL_TABLET | Freq: Every day | ORAL | 3 refills | Status: DC

## 2020-10-16 MED ORDER — CLOPIDOGREL BISULFATE 75 MG PO TABS: 75.0000 mg | ORAL_TABLET | Freq: Every day | ORAL | 1 refills | Status: DC

## 2020-10-16 MED ORDER — APIXABAN 5 MG PO TABS: 5.0000 mg | ORAL_TABLET | Freq: Two times a day (BID) | ORAL | 3 refills | Status: DC

## 2020-10-16 NOTE — Progress Notes (Signed)
Radiation Oncology         [phone] ________________________________  Name: John [name] MRN: [id number]  Date: 10/18/2020  DOB: [birth year]  Follow-Up Visit Note  CC: [name], FNP  Derek Jack, MD    ICD-10-CM   1. Non-small cell lung cancer, right [facility])  C34.91       Diagnosis: The encounter diagnosis was non-small cell lung cancer, right ([place]).   Stage IIIa (T1b, N2, cM0) non-small cell carcinoma, now with enlarging o bilateral upper lobe nodules  Interval Since Last Radiation:  1 month and 1 day  Intent: Curative  Radiation Treatment Dates: 09/10/2020 through 09/17/2020 Site Technique Total Dose (Gy) Dose per Fx (Gy) Completed Fx Beam Energies  Lung, Right: Lung_Rt SBRT 54/54 18 3/3 6XFFF  Lung, Left: Lung_Lt SBRT 54/54 18 3/3 6XFFF   Narrative:  The patient returns today for routine 1 month follow-up.   Since he was last for his final treatment, the patient underwent cardioversion surgery for a-fib at [facility] on 10/05/20 under Dr. Audie Box. The patient was volume overloaded and was instructed to increase his torsemide for 3 days.             On 10/09/20, the patient presented to Jac Canavan NP at [facility] with complaints of fatigue and edema in both feet. He stated that the fatigue started several days prior to this visit and had gradually worsened. The patient was noted to appear anemic and weak. The patient subsequently presented to the [place] ED later that day. From the ED note, he stated that he has been taking his medications but has had increased bilateral lower extremity edema.  He has also had generalized fatigue and his wife noted that he has been getting winded and tired when doing any exertional activity. He stated that he had been experiencing lightheadedness particularly with activity.  He denies any chest pain and any bouts of syncope. He additionally stated that he had some dark stools but states that he  thinks this is from taking iron. He noted his BP was 113/50s that morning which he thought was low, and his heart rate has been higher in general. The patient's ED diagnosis was for symptomatic anemia. He was then transferred to [facility]. He agreed to receive transfusions for treatment (2 units of packed red blood cells).     Chest x-ray received during the  ED visit on 10/09/20 showed emphysematous and chronic bronchitic changes in the lungs, otherwise no evidence of active pulmonary disease was seen.  Also of note: CT of the abdomen and pelvis taken during this visit showed postprocedural changes in the right groin with stable seroma and mildly nodular hepatic contour; noted to raise possible suspicion of early cirrhosis.  The patient most recently followed up with Dr. Audie Box on 10/16/20. It was noted that the patient seems to be doing well but needs to work on his diet.     Patient denies any breathing problems at this time.  He has an occasional dry cough but nothing consistent.  He denies any pain within the chest area or hemoptysis.  Allergies:  is allergic to sulfa antibiotics, atorvastatin, jardiance [empagliflozin], lopressor [metoprolol], rosuvastatin, and temazepam.  Meds: Current Outpatient Medications  Medication Sig Dispense Refill   acetaminophen (TYLENOL) 650 MG CR tablet Take 650 mg by mouth every 8 (eight) hours as needed for pain.     albuterol (VENTOLIN HFA) 108 (90 Base) MCG/ACT inhaler Inhale 2 puffs into the  lungs every 4 (four) hours as needed for wheezing or shortness of breath. 8 g 6   apixaban (ELIQUIS) 5 MG TABS tablet Take 1 tablet (5 mg total) by mouth 2 (two) times daily. 60 tablet 3   Blood Glucose Monitoring Suppl (ONETOUCH VERIO REFLECT) w/Device KIT Use to test blood sugars daily as directed. DX: E11.9 1 kit 1   cloNIDine (CATAPRES) 0.1 MG tablet TAKE 1 TABLET (0.1 MG TOTAL) BY MOUTH 3 (THREE) TIMES DAILY. 270 tablet 0   clopidogrel (PLAVIX) 75 MG tablet Take 1  tablet (75 mg total) by mouth daily. 90 tablet 1   dutasteride (AVODART) 0.5 MG capsule TAKE 1 CAPSULE BY MOUTH EVERY DAY 90 capsule 0   ezetimibe (ZETIA) 10 MG tablet Take 1 tablet (10 mg total) by mouth daily. 90 tablet 3   fluticasone (FLONASE) 50 MCG/ACT nasal spray Place 1 spray into both nostrils daily as needed for allergies or rhinitis. 18.2 mL 2   Fluticasone-Umeclidin-Vilant (TRELEGY ELLIPTA) 100-62.5-25 MCG/INH AEPB Inhale 1 puff into the lungs daily.     gabapentin (NEURONTIN) 300 MG capsule TAKE 1 CAPSULE BY MOUTH THREE TIMES A DAY 270 capsule 0   glucose blood (ONETOUCH VERIO) test strip Use to test blood sugars daily as directed. DX: E11.9 100 each 12   Iron, Ferrous Sulfate, 325 (65 Fe) MG TABS Take 325 mg by mouth 2 (two) times daily. 180 tablet 1   levothyroxine (SYNTHROID) 50 MCG tablet Take 1 tablet (50 mcg total) by mouth daily. 90 tablet 1   linaclotide (LINZESS) 145 MCG CAPS capsule Take 1 capsule (145 mcg total) by mouth daily. To regulate bowel movements 30 capsule 5   metFORMIN (GLUCOPHAGE-XR) 500 MG 24 hr tablet TAKE 1 TABLET BY MOUTH EVERY DAY WITH BREAKFAST 90 tablet 0   mupirocin ointment (BACTROBAN) 2 % Apply 1 application topically 2 (two) times daily as needed (wound care).     ondansetron (ZOFRAN-ODT) 4 MG disintegrating tablet TAKE 1 TABLET BY MOUTH EVERY 8 HOURS AS NEEDED FOR NAUSEA AND VOMITING 20 tablet 0   OneTouch Delica Lancets 04V MISC Use to test blood sugars daily as directed. DX: E11.9 100 each 1   pantoprazole (PROTONIX) 40 MG tablet Take 1 tablet (40 mg total) by mouth daily. 90 tablet 1   potassium chloride SA (KLOR-CON) 20 MEQ tablet Take 1 tablet (20 mEq total) by mouth daily. 90 tablet 0   pravastatin (PRAVACHOL) 40 MG tablet TAKE 1 TABLET (40 MG TOTAL) BY MOUTH ON MONDAYS , WEDNESDAYS, AND FRIDAYS AS DIRECTED 48 tablet 3   Semaglutide (RYBELSUS) 7 MG TABS Take 7 mg by mouth daily. 90 tablet 1   senna-docusate (SENOKOT-S) 8.6-50 MG tablet Take 1  tablet by mouth 2 (two) times daily between meals as needed for mild constipation. 180 tablet 0   torsemide 40 MG TABS Take 40 mg by mouth daily. 180 tablet 3   zolpidem (AMBIEN) 5 MG tablet Take 5 mg by mouth at bedtime as needed for sleep.     No current facility-administered medications for this encounter.    Physical Findings: The patient is in no acute distress. Patient is alert and oriented.  Sitting comfortably in wheelchair.  weight is 158 lb (71.7 kg). His temperature is 97 F (36.1 C) (abnormal). His blood pressure is 100/49 (abnormal) and his pulse is 84. His respiration is 18 and oxygen saturation is 100%. .  No significant changes. Lungs are clear to auscultation bilaterally. Heart has regular rate and  rhythm. No palpable cervical, supraclavicular, or axillary adenopathy. Abdomen soft, non-tender, normal bowel sounds.   Lab Findings: Lab Results  Component Value Date   WBC 7.4 10/09/2020   HGB 8.9 (L) 10/10/2020   HCT 30.1 (L) 10/10/2020   MCV 90.0 10/09/2020   PLT 353 10/09/2020    Radiographic Findings: DG Chest 2 View  Result Date: 10/09/2020 CLINICAL DATA:  Worsening leg edema with history of congestive heart failure. EXAM: CHEST - 2 VIEW COMPARISON:  09/13/2020 FINDINGS: Heart size and pulmonary vascularity are normal. Emphysematous changes and fibrosis in the lungs. No evidence of airspace disease or focal consolidation. Calcification in the aorta and mitral valve. Power port type central venous catheter with tip over the low SVC region. No pleural effusions. No pneumothorax. Mediastinal contours appear intact. Degenerative changes in the spine and shoulders. IMPRESSION: Emphysematous and chronic bronchitic changes in the lungs. No evidence of active pulmonary disease. Electronically Signed   By: Lucienne Capers M.D.   On: 10/09/2020 22:01   CT Abdomen Pelvis W Contrast  Result Date: 10/10/2020 CLINICAL DATA:  Leg swelling, fatigue, hypotension, and anemia following  cardioversion. EXAM: CT ABDOMEN AND PELVIS WITH CONTRAST TECHNIQUE: Multidetector CT imaging of the abdomen and pelvis was performed using the standard protocol following bolus administration of intravenous contrast. CONTRAST:  142m OMNIPAQUE IOHEXOL 300 MG/ML  SOLN COMPARISON:  09/05/2020 FINDINGS: Lower chest: Lung bases are clear. Hepatobiliary: Mildly nodular hepatic contour, raising the possibility of early cirrhosis. A few calcified hepatic granulomata, benign. No focal hepatic lesion is seen. Gallbladder is unremarkable. No intrahepatic or extrahepatic duct dilatation. Pancreas: Within normal limits. Spleen: Within normal limits. Adrenals/Urinary Tract: Adrenal glands are within normal limits. 4.0 cm posterior interpolar right renal cyst (series 2/image 29). Left kidney is within normal limits. No hydronephrosis. Bladder is within normal limits. Stomach/Bowel: Stomach is within normal limits. No evidence of bowel obstruction. Normal appendix (series 2/image 45). Sigmoid diverticulosis, without evidence of diverticulitis. Vascular/Lymphatic: No evidence of abdominal aortic aneurysm. Atherosclerotic calcifications of the abdominal aorta and branch vessels. No suspicious abdominopelvic lymphadenopathy. Reproductive: Status post TURP. Other: No abdominopelvic ascites. Postprocedural changes in the right groin with stable 4.2 cm seroma (series 2/image 69). Musculoskeletal: Degenerative changes of the visualized thoracolumbar spine. IMPRESSION: Postprocedural changes in the right groin with stable seroma. Mildly nodular hepatic contour, at least raising the possibility of early cirrhosis. Additional stable ancillary findings as above. Electronically Signed   By: SJulian HyM.D.   On: 10/10/2020 00:02    Impression:  The encounter diagnosis was non-small cell lung cancer, right ([place].  Patient tolerated his SBRT well without any side effects or lingering issues.  Plan: Patient is scheduled for chest CT  scan in September and will follow-up with Dr. KDelton Coombesafter the scan. In light of The patient's close follow-up with medical oncology have not scheduled him for formal follow-up with me but would be glad to see him anytime.    ____________________________________  JBlair Promise PhD, MD   This document serves as a record of services personally performed by JGery Pray MD. It was created on his behalf by ERoney Mans a trained medical scribe. The creation of this record is based on the scribe's personal observations and the provider's statements to them. This document has been checked and approved by the attending provider.

## 2020-10-16 NOTE — Patient Instructions (Addendum)
Medication Instructions:  Start Zetia 10 mg daily  Take Torsemide 40 mg twice daily for 5 days, then decrease to 40 mg daily  Take your Potassium tablet with this.    *If you need a refill on your cardiac medications before your next appointment, please call your pharmacy*   Lab Work: CMET, BNP today   If you have labs (blood work) drawn today and your tests are completely normal, you will receive your results only by: [place] (if you have MyChart) OR A paper copy in the mail If you have any lab test that is abnormal or we need to change your treatment, we will call you to review the results.  Follow-Up: At [facility], you and your health needs are our priority.  As part of our continuing mission to provide you with exceptional heart care, we have created designated Provider Care Teams.  These Care Teams include your primary Cardiologist (physician) and Advanced Practice Providers (APPs -  Physician Assistants and Nurse Practitioners) who all work together to provide you with the care you need, when you need it.  We recommend signing up for the patient portal called "MyChart".  Sign up information is provided on this After Visit Summary.  MyChart is used to connect with patients for Virtual Visits (Telemedicine).  Patients are able to view lab/test results, encounter notes, upcoming appointments, etc.  Non-urgent messages can be sent to your provider as well.   To learn more about what you can do with MyChart, go to [email or web address].    Your next appointment:   6 week(s)  The format for your next appointment:   In Person  Provider:   You will see one of the following Advanced Practice Providers on your designated Care Team:   Almyra Deforest, PA-C Sande Rives, [place]

## 2020-10-16 NOTE — Progress Notes (Incomplete)
  Radiation Oncology         (336) [phone] ________________________________  Patient Name: John [name] MRN: [id number] DOB: 1945-08-14 Referring Physician: Derek Jack Date of Service: 09/17/2020 [facility], [place]  End Of Treatment Note  Diagnoses: C34.11-Malignant neoplasm of upper lobe, right bronchus or lung C34.12-Malignant neoplasm of upper lobe, left bronchus or lung  Cancer Staging: Stage IIIa (T1b, N2, cM0) non-small cell carcinoma  Intent: Curative  Radiation Treatment Dates: 09/10/2020 through 09/17/2020 Site Technique Total Dose (Gy) Dose per Fx (Gy) Completed Fx Beam Energies  Lung, Right: Lung_Rt IMRT 54/54 18 3/3 6XFFF  Lung, Left: Lung_Lt IMRT 54/54 18 3/3 6XFFF   Narrative: The patient tolerated radiation therapy relatively well. He denies having pain, although reports having mild fatigue as well as a  productive cough and shortness of breath. He denied having any issues with swallowing and reports having a fair appetite.  Upon physical examination; his lungs were clear and he appeared to be alert and in no acute distress.    Plan: The patient will follow-up with radiation oncology in one month .  ________________________________________________ -----------------------------------  Blair Promise, PhD, MD  This document serves as a record of services personally performed by Gery Pray, MD. It was created on his behalf by Roney Mans, a trained medical scribe. The creation of this record is based on the scribe's personal observations and the provider's statements to them. This document has been checked and approved by the attending provider.

## 2020-10-17 DIAGNOSIS — Z48812 Encounter for surgical aftercare following surgery on the circulatory system: Secondary | ICD-10-CM | POA: Diagnosis not present

## 2020-10-17 DIAGNOSIS — Z95820 Peripheral vascular angioplasty status with implants and grafts: Secondary | ICD-10-CM | POA: Diagnosis not present

## 2020-10-17 DIAGNOSIS — I509 Heart failure, unspecified: Secondary | ICD-10-CM | POA: Diagnosis not present

## 2020-10-17 DIAGNOSIS — E1151 Type 2 diabetes mellitus with diabetic peripheral angiopathy without gangrene: Secondary | ICD-10-CM | POA: Diagnosis not present

## 2020-10-17 DIAGNOSIS — I110 Hypertensive heart disease with heart failure: Secondary | ICD-10-CM | POA: Diagnosis not present

## 2020-10-17 DIAGNOSIS — I4892 Unspecified atrial flutter: Secondary | ICD-10-CM | POA: Diagnosis not present

## 2020-10-17 LAB — COMPREHENSIVE METABOLIC PANEL
ALT: 12 IU/L (ref 0–44)
AST: 17 IU/L (ref 0–40)
Albumin/Globulin Ratio: 1.2 (ref 1.2–2.2)
Albumin: 3.7 g/dL (ref 3.7–4.7)
Alkaline Phosphatase: 126 IU/L — ABNORMAL HIGH (ref 44–121)
BUN/Creatinine Ratio: 15 (ref 10–24)
BUN: 12 mg/dL (ref 8–27)
Bilirubin Total: 0.3 mg/dL (ref 0.0–1.2)
CO2: 21 mmol/L (ref 20–29)
Calcium: 9.1 mg/dL (ref 8.6–10.2)
Chloride: 99 mmol/L (ref 96–106)
Creatinine, Ser: 0.78 mg/dL (ref 0.76–1.27)
Globulin, Total: 3 g/dL (ref 1.5–4.5)
Glucose: 115 mg/dL — ABNORMAL HIGH (ref 65–99)
Potassium: 4.8 mmol/L (ref 3.5–5.2)
Sodium: 139 mmol/L (ref 134–144)
Total Protein: 6.7 g/dL (ref 6.0–8.5)
eGFR: 94 mL/min/{1.73_m2} (ref >59)

## 2020-10-17 LAB — BRAIN NATRIURETIC PEPTIDE: BNP: 228.9 pg/mL — ABNORMAL HIGH (ref 0.0–100.0)

## 2020-10-18 ENCOUNTER — Other Ambulatory Visit: Payer: Self-pay

## 2020-10-18 ENCOUNTER — Encounter: Payer: Self-pay | Admitting: Family Medicine

## 2020-10-18 ENCOUNTER — Ambulatory Visit
Admission: RE | Admit: 2020-10-18 | Discharge: 2020-10-18 | Disposition: A | Discharge status: Home / Self Care | Payer: Medicare Other | Source: Ambulatory Visit | Attending: Radiation Oncology | Admitting: Radiation Oncology

## 2020-10-18 ENCOUNTER — Ambulatory Visit (INDEPENDENT_AMBULATORY_CARE_PROVIDER_SITE_OTHER): Payer: Medicare Other | Admitting: Family Medicine

## 2020-10-18 ENCOUNTER — Encounter: Payer: Self-pay | Admitting: Radiation Oncology

## 2020-10-18 VITALS — BP 100/49 | HR 84 | Temp 97.0°F | Resp 18 | Wt 158.0 lb

## 2020-10-18 VITALS — BP 98/56 | HR 87 | Temp 98.0°F | Resp 20 | Ht 66.0 in | Wt 156.0 lb

## 2020-10-18 DIAGNOSIS — I4891 Unspecified atrial fibrillation: Secondary | ICD-10-CM | POA: Insufficient documentation

## 2020-10-18 DIAGNOSIS — I509 Heart failure, unspecified: Secondary | ICD-10-CM

## 2020-10-18 DIAGNOSIS — Z79899 Other long term (current) drug therapy: Secondary | ICD-10-CM | POA: Diagnosis not present

## 2020-10-18 DIAGNOSIS — R21 Rash and other nonspecific skin eruption: Secondary | ICD-10-CM

## 2020-10-18 DIAGNOSIS — Z7984 Long term (current) use of oral hypoglycemic drugs: Secondary | ICD-10-CM | POA: Insufficient documentation

## 2020-10-18 DIAGNOSIS — C3412 Malignant neoplasm of upper lobe, left bronchus or lung: Secondary | ICD-10-CM | POA: Diagnosis not present

## 2020-10-18 DIAGNOSIS — C3491 Malignant neoplasm of unspecified part of right bronchus or lung: Secondary | ICD-10-CM

## 2020-10-18 DIAGNOSIS — D649 Anemia, unspecified: Secondary | ICD-10-CM

## 2020-10-18 DIAGNOSIS — R42 Dizziness and giddiness: Secondary | ICD-10-CM | POA: Diagnosis not present

## 2020-10-18 DIAGNOSIS — Z7901 Long term (current) use of anticoagulants: Secondary | ICD-10-CM | POA: Diagnosis not present

## 2020-10-18 DIAGNOSIS — Z09 Encounter for follow-up examination after completed treatment for conditions other than malignant neoplasm: Secondary | ICD-10-CM

## 2020-10-18 DIAGNOSIS — I10 Essential (primary) hypertension: Secondary | ICD-10-CM

## 2020-10-18 MED ORDER — HYDROCORTISONE 1 % EX CREA: 1.0000 "application " | TOPICAL_CREAM | Freq: Two times a day (BID) | CUTANEOUS | 0 refills | Status: DC

## 2020-10-18 NOTE — Progress Notes (Signed)
Established Patient Office Visit  Subjective:  Patient ID: John [name], male    DOB: 08-29-1945  Age: 75 y.o. MRN: [id number]  CC:  Chief Complaint  Patient presents with   Hospitalization Follow-up    HPI John [name] presents for [facility].  Today's visit was for Transitional Care Management.  The patient was discharged from [facility] on 10/10/20 with a primary diagnosis of symptomatic anemia.   Contact with the patient and/or caregiver, by a clinical staff member, was made on 10/11/20 and was documented as a telephone encounter within the EMR.  Through chart review and discussion with the patient I have determined that management of their condition is of moderate complexity.   He was sent to the ED on 10/09/20 for symptomatic anemia. His hemoglobin was 6.3. He was tachycardic. He also felt fatigued, weak, and had leg edema. He was given a blood transfusion. Hemoccult was negative. Abdomen CT was negative. CXR was negative for active cardio pulmonary. BNP was 358. He saw his cardiologist 2 days ago. His torsemide was increased to BID for 5 days. CMP was stable and BNp was lower at 228.  He reports feeling better overall. Continue to feel tired and a little lightheaded. He is staying well hydrated. He has not had much of an appetite. His BP has been a little low since increasing his fluid pill. Edema has improved. He denies shortness of breath, chest pain, orthopnea, cough, vomiting, or bleeding.   He does have a rash on his back for a few days. It is itching. It is not red but it feels bumpy. He has tried lotion without improvement.     Past Medical History:  Diagnosis Date   Adenocarcinoma of lung, right ([place]) 04/18/2016   Anxiety    Arthritis    Asthma    BPH (benign prostatic hyperplasia)    with urinary retention 02/06/20   CHF (congestive heart failure) (HCC)    COPD (chronic obstructive pulmonary disease) ([place])    Depression    Diabetes mellitus without complication  ([place])    no meds   Diabetes mellitus, type II ([place])    DM (diabetes mellitus) ([place]) 07/09/2016   Dyspnea    History of kidney stones    History of radiation therapy 06/25/16-08/20/16   right lung   History of radiation therapy 09/17/2020   right lung 09/10/2020-09/17/2020   Dr Sondra Come   Hyperlipidemia    Hypertension    Hypertension    Hypothyroidism    Macular degeneration    Neuropathy    Non-small cell lung cancer, right ([place]) 04/18/2016   Peripheral vascular disease ([place])    Prostatitis    Pulmonary nodule, left 07/16/2016   Sleep apnea    cpap    Past Surgical History:  Procedure Laterality Date   ABDOMINAL AORTOGRAM W/LOWER EXTREMITY Left 02/06/2020   Procedure: ABDOMINAL AORTOGRAM W/LOWER EXTREMITY;  Surgeon: Lorretta Harp, MD;  Location: [place] [facility];  Service: Cardiovascular;  Laterality: Left;   CARDIOVERSION N/A 10/05/2020   Procedure: CARDIOVERSION;  Surgeon: Geralynn Rile, MD;  Location: [place];  Service: Cardiovascular;  Laterality: N/A;   CATARACT EXTRACTION, BILATERAL Bilateral    ENDARTERECTOMY FEMORAL Right 08/20/2020   Procedure: ENDARTERECTOMY  RIGHT FEMORAL ARTERY;  Surgeon: Marty Heck, MD;  Location: [place];  Service: Vascular;  Laterality: Right;   ESOPHAGOGASTRODUODENOSCOPY (EGD) WITH PROPOFOL N/A 09/06/2020   Procedure: ESOPHAGOGASTRODUODENOSCOPY (EGD) WITH PROPOFOL;  Surgeon: Irene Shipper, MD;  Location:  [place] [facility];  Service: Endoscopy;  Laterality: N/A;   INSERTION OF ILIAC STENT Right 08/20/2020   Procedure: RETROGRADE INSERTION OF RIGHT ILIAC STENT;  Surgeon: Marty Heck, MD;  Location: [place];  Service: Vascular;  Laterality: Right;   INTRAOPERATIVE ARTERIOGRAM Right 08/20/2020   Procedure: INTRA OPERATIVE ARTERIOGRAM ILIAC;  Surgeon: Marty Heck, MD;  Location: [place];  Service: Vascular;  Laterality: Right;   PATCH ANGIOPLASTY Right 08/20/2020   Procedure: PATCH ANGIOPLASTY RIGHT FEMORAL ARTERY;  Surgeon:  Marty Heck, MD;  Location: [place];  Service: Vascular;  Laterality: Right;   PORTACATH PLACEMENT Left 06/13/2016   Procedure: INSERTION PORT-A-CATH;  Surgeon: Aviva Signs, MD;  Location: [facility];  Service: General;  Laterality: Left;   TRANSURETHRAL RESECTION OF PROSTATE N/A 05/31/2020   Procedure: TRANSURETHRAL RESECTION OF THE PROSTATE (TURP);  Surgeon: Cleon Gustin, MD;  Location: [facility];  Service: Urology;  Laterality: N/A;   VIDEO BRONCHOSCOPY WITH ENDOBRONCHIAL NAVIGATION N/A 05/28/2016   Procedure: VIDEO BRONCHOSCOPY WITH ENDOBRONCHIAL NAVIGATION;  Surgeon: [name], MD;  Location: [place];  Service: Thoracic;  Laterality: N/A;   VIDEO BRONCHOSCOPY WITH ENDOBRONCHIAL ULTRASOUND N/A 05/28/2016   Procedure: VIDEO BRONCHOSCOPY WITH ENDOBRONCHIAL ULTRASOUND;  Surgeon: [name], MD;  Location: [place];  Service: Thoracic;  Laterality: N/A;    Family History  Problem Relation Age of Onset   Hypertension Mother    Diabetes Father    Heart disease Father    Stroke Father    Hypertension Sister     Social History   Socioeconomic History   Marital status: Soil scientist    Spouse name: Not on file   Number of children: 5   Years of education: 10   Highest education level: 10th grade  Occupational History   Occupation: Retired  Tobacco Use   Smoking status: Every Day    Packs/day: 0.50    Years: 55.00    Pack years: 27.50    Types: Cigarettes    Start date: 03/11/1961   Smokeless tobacco: Never   Tobacco comments:    1/2 pack to 1 pack per day 12/14/19  Vaping Use   Vaping Use: Never used  Substance and Sexual Activity   Alcohol use: No   Drug use: No   Sexual activity: Yes    Birth control/protection: None  Other Topics Concern   Not on file  Social History Narrative   [name] is his POA and lives with him. Children out of town - one in [facility], others in [place].   Social Determinants of Health   Financial Resource Strain: Low Risk     Difficulty of Paying Living Expenses: Not hard at all  Food Insecurity: No Food Insecurity   Worried About Charity fundraiser in the Last Year: Never true   [place] in the Last Year: Never true  Transportation Needs: No Transportation Needs   Lack of Transportation (Medical): No   Lack of Transportation (Non-Medical): No  Physical Activity: Unknown   Days of Exercise per Week: 7 days   Minutes of Exercise per Session: Not on file  Stress: No Stress Concern Present   Feeling of Stress : Not at all  Social Connections: Moderately Isolated   Frequency of Communication with Friends and Family: More than three times a week   Frequency of Social Gatherings with Friends and Family: Once a week   Attends Religious Services: Never   Retail buyer of [employer]  or Organizations: No   Attends Archivist Meetings: Never   Marital Status: Living with partner  Intimate Partner Violence: Not At Risk   Fear of Current or Ex-Partner: No   Emotionally Abused: No   Physically Abused: No   Sexually Abused: No    Outpatient Medications Prior to Visit  Medication Sig Dispense Refill   acetaminophen (TYLENOL) 650 MG CR tablet Take 650 mg by mouth every 8 (eight) hours as needed for pain.     albuterol (VENTOLIN HFA) 108 (90 Base) MCG/ACT inhaler Inhale 2 puffs into the lungs every 4 (four) hours as needed for wheezing or shortness of breath. 8 g 6   apixaban (ELIQUIS) 5 MG TABS tablet Take 1 tablet (5 mg total) by mouth 2 (two) times daily. 60 tablet 3   Blood Glucose Monitoring Suppl (ONETOUCH VERIO REFLECT) w/Device KIT Use to test blood sugars daily as directed. DX: E11.9 1 kit 1   cloNIDine (CATAPRES) 0.1 MG tablet TAKE 1 TABLET (0.1 MG TOTAL) BY MOUTH 3 (THREE) TIMES DAILY. 270 tablet 0   clopidogrel (PLAVIX) 75 MG tablet Take 1 tablet (75 mg total) by mouth daily. 90 tablet 1   dutasteride (AVODART) 0.5 MG capsule TAKE 1 CAPSULE BY MOUTH EVERY DAY 90 capsule 0   ezetimibe (ZETIA)  10 MG tablet Take 1 tablet (10 mg total) by mouth daily. 90 tablet 3   fluticasone (FLONASE) 50 MCG/ACT nasal spray Place 1 spray into both nostrils daily as needed for allergies or rhinitis. 18.2 mL 2   Fluticasone-Umeclidin-Vilant (TRELEGY ELLIPTA) 100-62.5-25 MCG/INH AEPB Inhale 1 puff into the lungs daily.     gabapentin (NEURONTIN) 300 MG capsule TAKE 1 CAPSULE BY MOUTH THREE TIMES A DAY 270 capsule 0   glucose blood (ONETOUCH VERIO) test strip Use to test blood sugars daily as directed. DX: E11.9 100 each 12   Iron, Ferrous Sulfate, 325 (65 Fe) MG TABS Take 325 mg by mouth 2 (two) times daily. 180 tablet 1   levothyroxine (SYNTHROID) 50 MCG tablet Take 1 tablet (50 mcg total) by mouth daily. 90 tablet 1   linaclotide (LINZESS) 145 MCG CAPS capsule Take 1 capsule (145 mcg total) by mouth daily. To regulate bowel movements 30 capsule 5   metFORMIN (GLUCOPHAGE-XR) 500 MG 24 hr tablet TAKE 1 TABLET BY MOUTH EVERY DAY WITH BREAKFAST 90 tablet 0   mupirocin ointment (BACTROBAN) 2 % Apply 1 application topically 2 (two) times daily as needed (wound care).     ondansetron (ZOFRAN-ODT) 4 MG disintegrating tablet TAKE 1 TABLET BY MOUTH EVERY 8 HOURS AS NEEDED FOR NAUSEA AND VOMITING 20 tablet 0   OneTouch Delica Lancets 16X MISC Use to test blood sugars daily as directed. DX: E11.9 100 each 1   pantoprazole (PROTONIX) 40 MG tablet Take 1 tablet (40 mg total) by mouth daily. 90 tablet 1   potassium chloride SA (KLOR-CON) 20 MEQ tablet Take 1 tablet (20 mEq total) by mouth daily. 90 tablet 0   pravastatin (PRAVACHOL) 40 MG tablet TAKE 1 TABLET (40 MG TOTAL) BY MOUTH ON MONDAYS , WEDNESDAYS, AND FRIDAYS AS DIRECTED 48 tablet 3   Semaglutide (RYBELSUS) 7 MG TABS Take 7 mg by mouth daily. 90 tablet 1   senna-docusate (SENOKOT-S) 8.6-50 MG tablet Take 1 tablet by mouth 2 (two) times daily between meals as needed for mild constipation. 180 tablet 0   torsemide 40 MG TABS Take 40 mg by mouth daily. 180 tablet 3    zolpidem (  AMBIEN) 5 MG tablet Take 5 mg by mouth at bedtime as needed for sleep.     No facility-administered medications prior to visit.    Allergies  Allergen Reactions   Sulfa Antibiotics Swelling    Mouth swelling   Atorvastatin Other (See Comments)    Muscle aches - tolerating Pravastatin 40 mg MWF   Jardiance [Empagliflozin] Other (See Comments)    FEELS SLUGGISH, TIRED   Lopressor [Metoprolol] Other (See Comments)    Fatigue   Rosuvastatin Other (See Comments)    Muscle aches - tolerating Pravastatin 40 mg MWF   Temazepam Other (See Comments)    Made insomnia worse     ROS Review of Systems As per HPI.    Objective:    Physical Exam Vitals and nursing note reviewed.  Constitutional:      General: He is not in acute distress.    Appearance: He is not ill-appearing, toxic-appearing or diaphoretic.  Neck:     Vascular: No JVD.  Cardiovascular:     Rate and Rhythm: Normal rate and regular rhythm.     Heart sounds: Normal heart sounds. No murmur heard. Pulmonary:     Effort: Pulmonary effort is normal. No respiratory distress.     Breath sounds: Normal breath sounds.  Abdominal:     General: Bowel sounds are normal.     Palpations: Abdomen is soft.  Musculoskeletal:     Right lower leg: 1+ Pitting Edema present.     Left lower leg: 1+ Pitting Edema present.  Skin:    General: Skin is warm and dry.     Findings: Rash (Back. No erythema or signs of infection.) present.  Neurological:     General: No focal deficit present.     Mental Status: He is alert and oriented to person, place, and time.  Psychiatric:        Mood and Affect: Mood normal.        Behavior: Behavior normal.    BP (!) 98/56   Pulse 87   Temp 98 F (36.7 C) (Temporal)   Resp 20   Ht _0  (1.676 m)   Wt 156 lb (70.8 kg)   SpO2 95%   BMI 25.18 kg/m  Wt Readings from Last 3 Encounters:  10/18/20 156 lb (70.8 kg)  10/18/20 158 lb (71.7 kg)  10/16/20 157 lb 12.8 oz (71.6 kg)      Health Maintenance Due  Topic Date Due   COVID-19 Vaccine (4 - Booster for Moderna series) 05/02/2020   FOOT EXAM  09/12/2020    There are no preventive care reminders to display for this patient.  Lab Results  Component Value Date   TSH 0.628 08/21/2020   Lab Results  Component Value Date   WBC 7.4 10/09/2020   HGB 8.9 (L) 10/10/2020   HCT 30.1 (L) 10/10/2020   MCV 90.0 10/09/2020   PLT 353 10/09/2020   Lab Results  Component Value Date   NA 139 10/16/2020   K 4.8 10/16/2020   CO2 21 10/16/2020   GLUCOSE 115 (H) 10/16/2020   BUN 12 10/16/2020   CREATININE 0.78 10/16/2020   BILITOT 0.3 10/16/2020   ALKPHOS 126 (H) 10/16/2020   AST 17 10/16/2020   ALT 12 10/16/2020   PROT 6.7 10/16/2020   ALBUMIN 3.7 10/16/2020   CALCIUM 9.1 10/16/2020   ANIONGAP 5 10/10/2020   EGFR 94 10/16/2020   Lab Results  Component Value Date   CHOL 126 08/21/2020  Lab Results  Component Value Date   HDL 28 (L) 08/21/2020   Lab Results  Component Value Date   LDLCALC 80 08/21/2020   Lab Results  Component Value Date   TRIG 89 08/21/2020   Lab Results  Component Value Date   CHOLHDL 4.5 08/21/2020   Lab Results  Component Value Date   HGBA1C 7.5 (H) 08/20/2020      Assessment & Plan:   Kawan was seen today for hospitalization follow-up.  Diagnoses and all orders for this visit:  Symptomatic anemia Was 6.3, has blood transfusion. No source of bleeding found. On oral iron supplement. Labs pending.  -     CBC with Differential  Essential hypertension BP low today at 98/56, likely due to recent increase in diuretic dosage. Hold clonidine until resume normal diuretic dosage. Monitor BP at home, notify for high or low readings.  -     CBC with Differential  Congestive heart failure, unspecified HF chronicity, unspecified heart failure type [facility]) Managed by cardiology, seen by them on 10/16/20. Diuretic was doubled x 5 days. Reports improvement in symptoms.    Rash Try cream as below.  -     hydrocortisone cream 1 %; Apply 1 application topically 2 (two) times daily.  Hospital discharge follow-up Reviewed hospital records.   Follow-up: Return in about 2 weeks (around 11/01/2020) for follow up with PCP . Sooner for new or worsening symptoms.   The patient indicates understanding of these issues and agrees with the plan.   Gwenlyn Perking, FNP

## 2020-10-18 NOTE — Patient Instructions (Signed)
Hold clonidine until Sunday. Keep an eye on blood pressure. On Monday if BP is >140/90, can restart clonidine.   Anemia  Anemia is a condition in which there is not enough red blood cells or hemoglobin in the blood. Hemoglobin is a substance in red blood cells thatcarries oxygen. When you do not have enough red blood cells or hemoglobin (are anemic), your body cannot get enough oxygen and your organs may not work properly. Asa result, you may feel very tired or have other problems. What are the causes? Common causes of anemia include: Excessive bleeding. Anemia can be caused by excessive bleeding inside or outside the body, including bleeding from the intestines or from heavy menstrual periods in females. Poor nutrition. Long-lasting (chronic) kidney, thyroid, and liver disease. Bone marrow disorders, spleen problems, and blood disorders. Cancer and treatments for cancer. HIV (human immunodeficiency virus) and AIDS (acquired immunodeficiency syndrome). Infections, medicines, and autoimmune disorders that destroy red blood cells. What are the signs or symptoms? Symptoms of this condition include: Minor weakness. Dizziness. Headache, or difficulties concentrating and sleeping. Heartbeats that feel irregular or faster than normal (palpitations). Shortness of breath, especially with exercise. Pale skin, lips, and nails, or cold hands and feet. Indigestion and nausea. Symptoms may occur suddenly or develop slowly. If your anemia is mild, you maynot have symptoms. How is this diagnosed? This condition is diagnosed based on blood tests, your medical history, and a physical exam. In some cases, a test may be needed in which cells are removed from the soft tissue inside of a bone and looked at under a microscope (bone marrow biopsy). Your health care provider may also check your stool (feces) for blood and may do additional testing to look for the cause of yourbleeding. Other tests may  include: Imaging tests, such as a CT scan or MRI. A procedure to see inside your esophagus and stomach (endoscopy). A procedure to see inside your colon and rectum (colonoscopy). How is this treated? Treatment for this condition depends on the cause. If you continue to lose a lot of blood, you may need to be treated at a hospital. Treatment may include: Taking supplements of iron, vitamin D42, or folic acid. Taking a hormone medicine (erythropoietin) that can help to stimulate red blood cell growth. Having a blood transfusion. This may be needed if you lose a lot of blood. Making changes to your diet. Having surgery to remove your spleen. Follow these instructions at home: Take over-the-counter and prescription medicines only as told by your health care provider. Take supplements only as told by your health care provider. Follow any diet instructions that you were given by your health care provider. Keep all follow-up visits as told by your health care provider. This is important. Contact a health care provider if: You develop new bleeding anywhere in the body. Get help right away if: You are very weak. You are short of breath. You have pain in your abdomen or chest. You are dizzy or feel faint. You have trouble concentrating. You have bloody stools, black stools, or tarry stools. You vomit repeatedly or you vomit up blood. These symptoms may represent a serious problem that is an emergency. Do not wait to see if the symptoms will go away. Get medical help right away. Call your local emergency services (911 in the [place] Do not drive yourself to the hospital. Summary Anemia is a condition in which you do not have enough red blood cells or enough of a substance in your  red blood cells that carries oxygen (hemoglobin). Symptoms may occur suddenly or develop slowly. If your anemia is mild, you may not have symptoms. This condition is diagnosed with blood tests, a medical history, and a  physical exam. Other tests may be needed. Treatment for this condition depends on the cause of the anemia. This information is not intended to replace advice given to you by your health care provider. Make sure you discuss any questions you have with your healthcare provider. Document Revised: 03/01/2019 Document Reviewed: 03/01/2019 Elsevier Patient Education  2022 [employer].

## 2020-10-18 NOTE — Progress Notes (Signed)
John [name] is here today for follow up post radiation to the lung.  Lung Side: bilateral lung  Does the patient complain of any of the following: Pain: patient denies pain.  Shortness of breath w/wo exertion: no Cough: dry Hemoptysis: no Pain with swallowing: no Swallowing/choking concerns: no Appetite: Patient reports having a good appetite some days, other days he does not want to eat.  Energy Level: Continues to have mild fatigue.  Post radiation skin Changes: Patient reports having itching to back and chest.    Additional comments if applicable:   Vitals:   10/18/20 0944  BP: (!) 100/49  Pulse: 84  Resp: 18  Temp: (!) 97 F (36.1 C)  SpO2: 100%  Weight: 158 lb (71.7 kg)

## 2020-10-19 LAB — CBC WITH DIFFERENTIAL/PLATELET
Basophils Absolute: 0 10*3/uL (ref 0.0–0.2)
Basos: 0 %
EOS (ABSOLUTE): 0.2 10*3/uL (ref 0.0–0.4)
Eos: 2 %
Hematocrit: 23.8 % — ABNORMAL LOW (ref 37.5–51.0)
Hemoglobin: 7 g/dL — CL (ref 13.0–17.7)
Immature Grans (Abs): 0 10*3/uL (ref 0.0–0.1)
Immature Granulocytes: 0 %
Lymphocytes Absolute: 0.5 10*3/uL — ABNORMAL LOW (ref 0.7–3.1)
Lymphs: 7 %
MCH: 27.2 pg (ref 26.6–33.0)
MCHC: 29.4 g/dL — ABNORMAL LOW (ref 31.5–35.7)
MCV: 93 fL (ref 79–97)
Monocytes Absolute: 0.8 10*3/uL (ref 0.1–0.9)
Monocytes: 11 %
Neutrophils Absolute: 5.8 10*3/uL (ref 1.4–7.0)
Neutrophils: 80 %
Platelets: 324 10*3/uL (ref 150–450)
RBC: 2.57 x10E6/uL — CL (ref 4.14–5.80)
RDW: 20.7 % — ABNORMAL HIGH (ref 11.6–15.4)
WBC: 7.4 10*3/uL (ref 3.4–10.8)

## 2020-10-21 ENCOUNTER — Other Ambulatory Visit: Payer: Self-pay | Admitting: Family

## 2020-10-22 ENCOUNTER — Encounter: Payer: Self-pay | Admitting: Family

## 2020-10-22 ENCOUNTER — Other Ambulatory Visit: Payer: Self-pay

## 2020-10-22 ENCOUNTER — Ambulatory Visit (INDEPENDENT_AMBULATORY_CARE_PROVIDER_SITE_OTHER): Payer: Medicare Other | Admitting: Family

## 2020-10-22 ENCOUNTER — Telehealth: Payer: Self-pay

## 2020-10-22 VITALS — BP 143/72 | HR 68 | Temp 98.3°F | Ht 66.0 in | Wt 153.0 lb

## 2020-10-22 DIAGNOSIS — R531 Weakness: Secondary | ICD-10-CM | POA: Diagnosis not present

## 2020-10-22 DIAGNOSIS — D649 Anemia, unspecified: Secondary | ICD-10-CM | POA: Diagnosis not present

## 2020-10-22 DIAGNOSIS — R110 Nausea: Secondary | ICD-10-CM | POA: Diagnosis not present

## 2020-10-22 DIAGNOSIS — I5032 Chronic diastolic (congestive) heart failure: Secondary | ICD-10-CM | POA: Diagnosis not present

## 2020-10-22 DIAGNOSIS — I10 Essential (primary) hypertension: Secondary | ICD-10-CM

## 2020-10-22 DIAGNOSIS — I2510 Atherosclerotic heart disease of native coronary artery without angina pectoris: Secondary | ICD-10-CM | POA: Diagnosis not present

## 2020-10-22 LAB — HEMOGLOBIN, FINGERSTICK: Hemoglobin: 8.4 g/dL — ABNORMAL LOW (ref 12.6–17.7)

## 2020-10-22 MED ORDER — ONDANSETRON 4 MG PO TBDP: 4.0000 mg | ORAL_TABLET | Freq: Three times a day (TID) | ORAL | 1 refills | Status: DC | PRN

## 2020-10-22 NOTE — Progress Notes (Signed)
Subjective:    Patient ID: John [name], male    DOB: 04-05-1946, 75 y.o.   MRN: [id number]  Chief Complaint  Patient presents with   Fatigue    Low hgb   Pt presents to the office today recheck Hgb. He was on 10/18/20 for hospital follow up. His hgb was 7.0. Today it is 8.4. He reports he continues to have weakness and tired. His SOB has improved.   He does have CHF and his torsemide was increased to 40 mg BID for 5 days. He states today will be the first day he will decrease back to 40 mg daily. He has lost 5 lbs since 07/19/20.  He is complaining of intermittent nausea and needs a refill of zofran.  Congestive Heart Failure Presents for follow-up visit. Associated symptoms include edema, fatigue and orthopnea. Pertinent negatives include no palpitations or shortness of breath. The symptoms have been improving.  Anemia Presents for follow-up visit. Symptoms include leg swelling, malaise/fatigue and pallor. There has been no palpitations.  Hypertension This is a chronic problem. The current episode started more than 1 year ago. The problem has been waxing and waning since onset. The problem is uncontrolled. Associated symptoms include malaise/fatigue and peripheral edema. Pertinent negatives include no palpitations or shortness of breath.     Review of Systems  Constitutional:  Positive for fatigue and malaise/fatigue.  Respiratory:  Negative for shortness of breath.   Cardiovascular:  Negative for palpitations.  Skin:  Positive for pallor.  All other systems reviewed and are negative.     Objective:   Physical Exam Vitals reviewed.  Constitutional:      General: He is not in acute distress.    Appearance: He is well-developed.  HENT:     Head: Normocephalic.     Right Ear: Tympanic membrane normal.     Left Ear: Tympanic membrane normal.  Eyes:     General:        Right eye: No discharge.        Left eye: No discharge.     Pupils: Pupils are equal, round, and  reactive to light.  Neck:     Thyroid: No thyromegaly.  Cardiovascular:     Rate and Rhythm: Normal rate and regular rhythm.     Heart sounds: Normal heart sounds. No murmur heard. Pulmonary:     Effort: Pulmonary effort is normal. No respiratory distress.     Breath sounds: Normal breath sounds. No wheezing.  Abdominal:     General: Bowel sounds are normal. There is no distension.     Palpations: Abdomen is soft.     Tenderness: There is no abdominal tenderness.  Musculoskeletal:        General: No tenderness. Normal range of motion.     Cervical back: Normal range of motion and neck supple.  Skin:    General: Skin is warm and dry.     Findings: No erythema or rash.  Neurological:     Mental Status: He is alert and oriented to person, place, and time.     Cranial Nerves: No cranial nerve deficit.     Motor: Weakness present.     Gait: Gait abnormal.     Deep Tendon Reflexes: Reflexes are normal and symmetric.  Psychiatric:        Behavior: Behavior normal.        Thought Content: Thought content normal.        Judgment: Judgment normal.  BP (!) 143/72   Pulse 68   Temp 98.3 F (36.8 C) (Temporal)   Ht _0  (1.676 m)   Wt 153 lb (69.4 kg)   SpO2 99%   BMI 24.69 kg/m   Assessment & Plan:  John [name] comes in today with chief complaint of Fatigue (Low hgb)   Diagnosis and orders addressed:  1. Symptomatic anemia Improving  - Hemoglobin, fingerstick - BMP8+EGFR - CBC with Differential/Platelet  2. Chronic diastolic CHF (congestive heart failure) (HCC) Continue torsemide  - BMP8+EGFR - CBC with Differential/Platelet  3. Essential hypertension - BMP8+EGFR - CBC with Differential/Platelet  4. Weakness generalized - BMP8+EGFR - CBC with Differential/Platelet  5. Nausea Will increase zofran to 1-2 tabs as needed - ondansetron (ZOFRAN-ODT) 4 MG disintegrating tablet; Take 1-2 tablets (4-8 mg total) by mouth every 8 (eight) hours as needed  for nausea or vomiting.  Dispense: 60 tablet; Refill: 1 - BMP8+EGFR - CBC with Differential/Platelet   Labs pending Health Maintenance reviewed Diet and exercise encouraged  Follow up plan: Keep chronic follow up already scheduled and follow up with specialists    John Dun, FNP

## 2020-10-22 NOTE — Telephone Encounter (Signed)
Stephany from [facility] calls today to report that patient's wound is draining green pus. I spoke to the patient, he is afebrile, but wound is red and tender and just started draining malodorous pus. Placed patient on the schedule tomorrow for wound check.

## 2020-10-22 NOTE — Patient Instructions (Signed)
Heart Failure, Diagnosis  Heart failure is a condition in which the heart has trouble pumping blood. This may mean that the heart cannot pump enough blood out to the body or that the heart does not fill up with enough blood. For some people with heart failure, fluid may back up into the lungs. There may also be swelling (edema) in the lower legs. Heart failure is usually a long-term (chronic) condition. It is important for you to take good care of yourself and followthe treatment plan from your health care provider. What are the causes? This condition may be caused by: High blood pressure (hypertension). Hypertension causes the heart muscle to work harder than normal. Coronary artery disease, or CAD. CAD is the buildup of cholesterol and fat (plaque) in the arteries of the heart. Heart attack, also called myocardial infarction. This injures the heart muscle, making it hard for the heart to pump blood. Abnormal heart valves. The valves do not open and close properly, forcing the heart to pump harder to keep the blood flowing. Heart muscle disease, inflammation, or infection (cardiomyopathy or myocarditis). This is damage to the heart muscle. It can increase the risk of heart failure. Lung disease. The heart works harder when the lungs are not healthy. What increases the risk? The risk of heart failure increases as a person ages. This condition is also more likely to develop in people who: Are obese. Are male. Use tobacco or nicotine products. Abuse alcohol or drugs. Have taken medicines that can damage the heart, such as chemotherapy drugs. Have any of these conditions: Diabetes. Abnormal heart rhythms. Thyroid problems. Low blood counts (anemia). Chronic kidney disease. Have a family history of heart failure. What are the signs or symptoms? Symptoms of this condition include: Shortness of breath with activity, such as when climbing stairs. A cough that does not go away. Swelling of the  feet, ankles, legs, or abdomen. Losing or gaining weight for no reason. Trouble breathing when lying flat. Waking from sleep because of the need to sit up and get more air. Rapid heartbeat. Tiredness (fatigue) and loss of energy. Feeling light-headed, dizzy, or close to fainting. Nausea or loss of appetite. Waking up more often during the night to urinate (nocturia). Confusion. How is this diagnosed? This condition is diagnosed based on: Your medical history, symptoms, and a physical exam. Diagnostic tests, which may include: Echocardiogram. Electrocardiogram (ECG). Chest X-ray. Blood tests. Exercise stress test. Cardiac MRI. Cardiac catheterization and angiogram. Radionuclide scans. How is this treated? Treatment for this condition is aimed at managing the symptoms of heart failure. Medicines Treatment may include medicines that: Help lower blood pressure by relaxing (dilating) the blood vessels. These medicines are called ACE inhibitors (angiotensin-converting enzyme), ARBs (angiotensin receptor blockers), or vasodilators. Cause the kidneys to remove salt and water from the blood through urination (diuretics). Improve heart muscle strength and prevent the heart from beating too fast (beta blockers). Increase the force of the heartbeat (digoxin). Lower heart rates. Certain diabetes medicines (SGLT-2 inhibitors) may also be used in treatment. Healthy behavior changes Treatment may also include making healthy lifestyle changes, such as: Reaching and staying at a healthy weight. Not using tobacco or nicotine products. Eating heart-healthy foods. Limiting or avoiding alcohol. Stopping the use of illegal drugs. Being physically active. Participating in a cardiac rehabilitation program, which is a treatment program to improve your health and well-being through exercise training, education, and counseling. Other treatments Other treatments may include: Procedures to open blocked  arteries or repair  damaged valves. Placing a pacemaker to improve heart function (cardiac resynchronization therapy). Placing a device to treat serious abnormal heart rhythms (implantable cardioverter defibrillator, or ICD). Placing a device to improve the pumping ability of the heart (left ventricular assist device, or LVAD). Receiving a healthy heart from a donor (heart transplant). This is done when other treatments have not helped. Follow these instructions at home: Manage other health conditions as told by your health care provider. These may include hypertension, diabetes, thyroid disease, or abnormal heart rhythms. Get ongoing education and support as needed. Learn as much as you can about heart failure. Keep all follow-up visits. This is important. Summary Heart failure is a condition in which the heart has trouble pumping blood. This condition is commonly caused by high blood pressure and other diseases of the heart and lungs. Symptoms of this condition include shortness of breath, tiredness (fatigue), nausea, and swelling of the feet, ankles, legs, or abdomen. Treatments for this condition may include medicines, lifestyle changes, and surgery. Manage other health conditions as told by your health care provider. This information is not intended to replace advice given to you by your health care provider. Make sure you discuss any questions you have with your healthcare provider. Document Revised: 10/15/2019 Document Reviewed: 10/15/2019 Elsevier Patient Education  [place].

## 2020-10-23 ENCOUNTER — Ambulatory Visit (INDEPENDENT_AMBULATORY_CARE_PROVIDER_SITE_OTHER): Payer: Medicare Other | Admitting: Physician Assistant

## 2020-10-23 VITALS — BP 122/50 | HR 106 | Temp 97.4°F | Resp 20 | Ht 66.0 in | Wt 153.1 lb

## 2020-10-23 DIAGNOSIS — I739 Peripheral vascular disease, unspecified: Secondary | ICD-10-CM

## 2020-10-23 DIAGNOSIS — Z95820 Peripheral vascular angioplasty status with implants and grafts: Secondary | ICD-10-CM | POA: Diagnosis not present

## 2020-10-23 DIAGNOSIS — E1151 Type 2 diabetes mellitus with diabetic peripheral angiopathy without gangrene: Secondary | ICD-10-CM | POA: Diagnosis not present

## 2020-10-23 DIAGNOSIS — I4892 Unspecified atrial flutter: Secondary | ICD-10-CM | POA: Diagnosis not present

## 2020-10-23 DIAGNOSIS — Z48812 Encounter for surgical aftercare following surgery on the circulatory system: Secondary | ICD-10-CM | POA: Diagnosis not present

## 2020-10-23 DIAGNOSIS — I509 Heart failure, unspecified: Secondary | ICD-10-CM | POA: Diagnosis not present

## 2020-10-23 DIAGNOSIS — L97521 Non-pressure chronic ulcer of other part of left foot limited to breakdown of skin: Secondary | ICD-10-CM | POA: Diagnosis not present

## 2020-10-23 DIAGNOSIS — I110 Hypertensive heart disease with heart failure: Secondary | ICD-10-CM | POA: Diagnosis not present

## 2020-10-23 LAB — CBC WITH DIFFERENTIAL/PLATELET
Basophils Absolute: 0.1 10*3/uL (ref 0.0–0.2)
Basos: 1 %
EOS (ABSOLUTE): 0.2 10*3/uL (ref 0.0–0.4)
Eos: 2 %
Hematocrit: 25.9 % — ABNORMAL LOW (ref 37.5–51.0)
Hemoglobin: 7.8 g/dL — CL (ref 13.0–17.7)
Immature Grans (Abs): 0.1 10*3/uL (ref 0.0–0.1)
Immature Granulocytes: 1 %
Lymphocytes Absolute: 0.6 10*3/uL — ABNORMAL LOW (ref 0.7–3.1)
Lymphs: 6 %
MCH: 28.4 pg (ref 26.6–33.0)
MCHC: 30.1 g/dL — ABNORMAL LOW (ref 31.5–35.7)
MCV: 94 fL (ref 79–97)
Monocytes Absolute: 1 10*3/uL — ABNORMAL HIGH (ref 0.1–0.9)
Monocytes: 10 %
Neutrophils Absolute: 8.1 10*3/uL — ABNORMAL HIGH (ref 1.4–7.0)
Neutrophils: 80 %
Platelets: 557 10*3/uL — ABNORMAL HIGH (ref 150–450)
RBC: 2.75 x10E6/uL — ABNORMAL LOW (ref 4.14–5.80)
RDW: 20.2 % — ABNORMAL HIGH (ref 11.6–15.4)
WBC: 10 10*3/uL (ref 3.4–10.8)

## 2020-10-23 LAB — BMP8+EGFR
BUN/Creatinine Ratio: 19 (ref 10–24)
BUN: 17 mg/dL (ref 8–27)
CO2: 29 mmol/L (ref 20–29)
Calcium: 8.9 mg/dL (ref 8.6–10.2)
Chloride: 94 mmol/L — ABNORMAL LOW (ref 96–106)
Creatinine, Ser: 0.89 mg/dL (ref 0.76–1.27)
Glucose: 191 mg/dL — ABNORMAL HIGH (ref 65–99)
Potassium: 3.8 mmol/L (ref 3.5–5.2)
Sodium: 140 mmol/L (ref 134–144)
eGFR: 90 mL/min/1.73

## 2020-10-23 NOTE — Progress Notes (Signed)
POST OPERATIVE OFFICE NOTE    CC:  F/u for surgery  HPI:  This is a 75 y.o. male who is s/p  1.  Intravascular ultrasound (IVUS) of right external iliac artery, right common iliac artery, and infrarenal aorta 2.  Right iliac arteriogram 3.  Angioplasty and stent of right common iliac artery (8 mm x 29 mm VBX) 4.  Right common femoral endarterectomy with profundoplasty and bovine pericardial patch angioplasty On 08/20/2020 by Dr. Carlis Abbott for lifestyle limiting claudication.  He has been seen several times since surgery for wound checks and there was some fat necrosis but no evidence of wound infection.  He was last seen 09/25/2020 and wound was healing.    Pt returns today for follow up with his wife.  Pt states his pre op leg is better but he feels it gives out sometimes.  He states he has continued drainage from the wound and shows me the bandage.  He has not had any fevers. He is still cleaning the wound with dial soap and putting dry dressing on wound.  He states that his pants irritate the incision and it feels as if it is burning and pulling on the healed part.   Allergies  Allergen Reactions   Sulfa Antibiotics Swelling    Mouth swelling   Atorvastatin Other (See Comments)    Muscle aches - tolerating Pravastatin 40 mg MWF   Jardiance [Empagliflozin] Other (See Comments)    FEELS SLUGGISH, TIRED   Lopressor [Metoprolol] Other (See Comments)    Fatigue   Rosuvastatin Other (See Comments)    Muscle aches - tolerating Pravastatin 40 mg MWF   Temazepam Other (See Comments)    Made insomnia worse     Current Outpatient Medications  Medication Sig Dispense Refill   acetaminophen (TYLENOL) 650 MG CR tablet Take 650 mg by mouth every 8 (eight) hours as needed for pain.     albuterol (VENTOLIN HFA) 108 (90 Base) MCG/ACT inhaler Inhale 2 puffs into the lungs every 4 (four) hours as needed for wheezing or shortness of breath. 8 g 6   apixaban (ELIQUIS) 5 MG TABS tablet Take 1 tablet (5  mg total) by mouth 2 (two) times daily. 60 tablet 3   Blood Glucose Monitoring Suppl (ONETOUCH VERIO REFLECT) w/Device KIT Use to test blood sugars daily as directed. DX: E11.9 1 kit 1   clopidogrel (PLAVIX) 75 MG tablet Take 1 tablet (75 mg total) by mouth daily. 90 tablet 1   dutasteride (AVODART) 0.5 MG capsule TAKE 1 CAPSULE BY MOUTH EVERY DAY 90 capsule 0   ezetimibe (ZETIA) 10 MG tablet Take 1 tablet (10 mg total) by mouth daily. 90 tablet 3   fluticasone (FLONASE) 50 MCG/ACT nasal spray Place 1 spray into both nostrils daily as needed for allergies or rhinitis. 18.2 mL 2   Fluticasone-Umeclidin-Vilant (TRELEGY ELLIPTA) 100-62.5-25 MCG/INH AEPB Inhale 1 puff into the lungs daily.     gabapentin (NEURONTIN) 300 MG capsule TAKE 1 CAPSULE BY MOUTH THREE TIMES A DAY 270 capsule 0   glucose blood (ONETOUCH VERIO) test strip Use to test blood sugars daily as directed. DX: E11.9 100 each 12   hydrocortisone cream 1 % Apply 1 application topically 2 (two) times daily. 56 g 0   Iron, Ferrous Sulfate, 325 (65 Fe) MG TABS Take 325 mg by mouth 2 (two) times daily. 180 tablet 1   levothyroxine (SYNTHROID) 50 MCG tablet Take 1 tablet (50 mcg total) by mouth daily. 90 tablet  1   linaclotide (LINZESS) 145 MCG CAPS capsule Take 1 capsule (145 mcg total) by mouth daily. To regulate bowel movements 30 capsule 5   metFORMIN (GLUCOPHAGE-XR) 500 MG 24 hr tablet TAKE 1 TABLET BY MOUTH EVERY DAY WITH BREAKFAST 90 tablet 0   mupirocin ointment (BACTROBAN) 2 % Apply 1 application topically 2 (two) times daily as needed (wound care).     ondansetron (ZOFRAN-ODT) 4 MG disintegrating tablet Take 1-2 tablets (4-8 mg total) by mouth every 8 (eight) hours as needed for nausea or vomiting. 60 tablet 1   OneTouch Delica Lancets 70W MISC Use to test blood sugars daily as directed. DX: E11.9 100 each 1   pantoprazole (PROTONIX) 40 MG tablet Take 1 tablet (40 mg total) by mouth daily. 90 tablet 1   potassium chloride SA  (KLOR-CON) 20 MEQ tablet Take 1 tablet (20 mEq total) by mouth daily. 90 tablet 0   pravastatin (PRAVACHOL) 40 MG tablet TAKE 1 TABLET (40 MG TOTAL) BY MOUTH ON MONDAYS , WEDNESDAYS, AND FRIDAYS AS DIRECTED 48 tablet 3   Semaglutide (RYBELSUS) 7 MG TABS Take 7 mg by mouth daily. 90 tablet 1   senna-docusate (SENOKOT-S) 8.6-50 MG tablet Take 1 tablet by mouth 2 (two) times daily between meals as needed for mild constipation. 180 tablet 0   torsemide 40 MG TABS Take 40 mg by mouth daily. 180 tablet 3   zolpidem (AMBIEN) 5 MG tablet Take 5 mg by mouth at bedtime as needed for sleep.     No current facility-administered medications for this visit.     ROS:  See HPI  Physical Exam:  Today's Vitals   10/23/20 1105  BP: (!) 122/50  Pulse: (!) 106  Resp: 20  Temp: (!) 97.4 F (36.3 C)  TempSrc: Temporal  SpO2: 98%  Weight: 153 lb 1.6 oz (69.4 kg)  Height: 5' 6" (1.676 m)  PainSc: 1   PainLoc: Groin   Body mass index is 24.71 kg/m.   Incision:     Extremities:  palpable right DP pulse with brisk right DP/peroneal and faint PT doppler signals.  He does have a brisk left DP doppler signal.     Assessment/Plan:  This is a 75 y.o. male who is s/p: 1.  Intravascular ultrasound (IVUS) of right external iliac artery, right common iliac artery, and infrarenal aorta 2.  Right iliac arteriogram 3.  Angioplasty and stent of right common iliac artery (8 mm x 29 mm VBX) 4.  Right common femoral endarterectomy with profundoplasty and bovine pericardial patch angioplasty On 08/20/2020 by Dr. Carlis Abbott for lifestyle limiting claudication.  -pt seen with Dr. Carlis Abbott.  Incision as above.  No evidence of infection. Discussed wet to dry dressing with pt and wife.  Pt given supplies for this.  She will do this twice a day and continue cleaning wound with dial soap.   -will have him come back in 6 weeks to check incision and our office will schedule ABI for 4 months from now (this is in the  que).   Leontine Locket, [facility] [phone]   Clinic MD:  Carlis Abbott

## 2020-10-24 ENCOUNTER — Other Ambulatory Visit: Payer: Self-pay

## 2020-10-24 DIAGNOSIS — I739 Peripheral vascular disease, unspecified: Secondary | ICD-10-CM

## 2020-10-25 ENCOUNTER — Other Ambulatory Visit: Payer: Self-pay | Admitting: Family

## 2020-10-25 ENCOUNTER — Telehealth: Payer: Self-pay | Admitting: *Deleted

## 2020-10-25 DIAGNOSIS — D649 Anemia, unspecified: Secondary | ICD-10-CM

## 2020-10-25 NOTE — Telephone Encounter (Signed)
Needs to be seen

## 2020-10-25 NOTE — Telephone Encounter (Signed)
Pt called this am has been swimmy headed, dizzy, has had hallucination that his plate & things on the table while he was eating have moved. Wondering if it is any of his meds. Only new meds at last 2 visits has been the Ondansetron which he took 2 on Tuesday and none since, this has been going on since his last visit.

## 2020-10-25 NOTE — Telephone Encounter (Signed)
Patient aware and verbalized understanding. Appt made

## 2020-10-26 ENCOUNTER — Ambulatory Visit (INDEPENDENT_AMBULATORY_CARE_PROVIDER_SITE_OTHER): Payer: Medicare Other | Admitting: Family Medicine

## 2020-10-26 ENCOUNTER — Other Ambulatory Visit: Payer: Self-pay

## 2020-10-26 ENCOUNTER — Encounter: Payer: Self-pay | Admitting: Family Medicine

## 2020-10-26 ENCOUNTER — Ambulatory Visit (INDEPENDENT_AMBULATORY_CARE_PROVIDER_SITE_OTHER): Payer: Medicare Other | Admitting: Urology

## 2020-10-26 ENCOUNTER — Encounter: Payer: Self-pay | Admitting: Urology

## 2020-10-26 VITALS — BP 124/70 | HR 109 | Temp 98.2°F

## 2020-10-26 VITALS — BP 118/61 | HR 107 | Temp 98.0°F | Ht 66.0 in | Wt 152.6 lb

## 2020-10-26 DIAGNOSIS — K1370 Unspecified lesions of oral mucosa: Secondary | ICD-10-CM | POA: Diagnosis not present

## 2020-10-26 DIAGNOSIS — R443 Hallucinations, unspecified: Secondary | ICD-10-CM

## 2020-10-26 DIAGNOSIS — N138 Other obstructive and reflux uropathy: Secondary | ICD-10-CM

## 2020-10-26 DIAGNOSIS — N401 Enlarged prostate with lower urinary tract symptoms: Secondary | ICD-10-CM | POA: Diagnosis not present

## 2020-10-26 DIAGNOSIS — R339 Retention of urine, unspecified: Secondary | ICD-10-CM

## 2020-10-26 DIAGNOSIS — I2510 Atherosclerotic heart disease of native coronary artery without angina pectoris: Secondary | ICD-10-CM

## 2020-10-26 DIAGNOSIS — R21 Rash and other nonspecific skin eruption: Secondary | ICD-10-CM

## 2020-10-26 DIAGNOSIS — L299 Pruritus, unspecified: Secondary | ICD-10-CM | POA: Diagnosis not present

## 2020-10-26 DIAGNOSIS — D519 Vitamin B12 deficiency anemia, unspecified: Secondary | ICD-10-CM | POA: Diagnosis not present

## 2020-10-26 DIAGNOSIS — E039 Hypothyroidism, unspecified: Secondary | ICD-10-CM

## 2020-10-26 DIAGNOSIS — D649 Anemia, unspecified: Secondary | ICD-10-CM

## 2020-10-26 LAB — URINALYSIS, ROUTINE W REFLEX MICROSCOPIC
Bilirubin, UA: NEGATIVE
Glucose, UA: NEGATIVE
Leukocytes,UA: NEGATIVE
Nitrite, UA: NEGATIVE
RBC, UA: NEGATIVE
Specific Gravity, UA: 1.02 (ref 1.005–1.030)
Urobilinogen, Ur: 0.2 mg/dL (ref 0.2–1.0)
pH, UA: 6 (ref 5.0–7.5)

## 2020-10-26 LAB — URINALYSIS
Bilirubin, UA: NEGATIVE
Glucose, UA: NEGATIVE
Ketones, UA: NEGATIVE
Leukocytes,UA: NEGATIVE
Nitrite, UA: NEGATIVE
Protein,UA: NEGATIVE
RBC, UA: NEGATIVE
Specific Gravity, UA: 1.015 (ref 1.005–1.030)
Urobilinogen, Ur: 1 mg/dL (ref 0.2–1.0)
pH, UA: 6 (ref 5.0–7.5)

## 2020-10-26 LAB — BLADDER SCAN AMB NON-IMAGING

## 2020-10-26 MED ORDER — TRIAMCINOLONE ACETONIDE 0.1 % EX CREA: 1.0000 "application " | TOPICAL_CREAM | Freq: Two times a day (BID) | CUTANEOUS | 1 refills | Status: DC

## 2020-10-26 MED ORDER — NYSTATIN 100000 UNIT/ML MT SUSP: 5.0000 mL | Freq: Four times a day (QID) | OROMUCOSAL | 0 refills | Status: DC

## 2020-10-26 MED ORDER — LEVOCETIRIZINE DIHYDROCHLORIDE 5 MG PO TABS: 5.0000 mg | ORAL_TABLET | Freq: Every day | ORAL | 2 refills | Status: DC

## 2020-10-26 NOTE — Progress Notes (Signed)
Urological Symptom Review  Patient is experiencing the following symptoms: -NONE   Review of Systems  Gastrointestinal (upper)  : Nausea  Gastrointestinal (lower) : Constipation  Constitutional : Fatigue  Skin: Itching  Eyes: Blurred vision  Ear/Nose/Throat : Negative for Ear/Nose/Throat symptoms  Hematologic/Lymphatic: Easy bruising  Cardiovascular : Leg swelling  Respiratory : Negative for respiratory symptoms  Endocrine: Excessive thirst  Musculoskeletal: Negative for musculoskeletal symptoms  Neurological: Negative for neurological symptoms  Psychologic: Negative for psychiatric symptoms

## 2020-10-26 NOTE — Patient Instructions (Signed)
Benign Prostatic Hyperplasia  Benign prostatic hyperplasia (BPH) is an enlarged prostate gland that is caused by the normal aging process and not by cancer. The prostate is a walnut-sized gland that is involved in the production of semen. It is located in front of the rectum and below the bladder. The bladder stores urine and the urethra is the tube that carries the urine out of the body. The prostate may get bigger asa man gets older. An enlarged prostate can press on the urethra. This can make it harder to pass urine. The build-up of urine in the bladder can cause infection. Back pressure and infection may progress to bladder damage and kidney (renal) failure. What are the causes? This condition is part of a normal aging process. However, not all men develop problems from this condition. If the prostate enlarges away from the urethra, urine flow will not be blocked. If it enlarges toward the urethra andcompresses it, there will be problems passing urine. What increases the risk? This condition is more likely to develop in men over the age of 50 years. What are the signs or symptoms? Symptoms of this condition include: Getting up often during the night to urinate. Needing to urinate frequently during the day. Difficulty starting urine flow. Decrease in size and strength of your urine stream. Leaking (dribbling) after urinating. Inability to pass urine. This needs immediate treatment. Inability to completely empty your bladder. Pain when you pass urine. This is more common if there is also an infection. Urinary tract infection (UTI). How is this diagnosed? This condition is diagnosed based on your medical history, a physical exam, and your symptoms. Tests will also be done, such as: A post-void bladder scan. This measures any amount of urine that may remain in your bladder after you finish urinating. A digital rectal exam. In a rectal exam, your health care provider checks your prostate by  putting a lubricated, gloved finger into your rectum to feel the back of your prostate gland. This exam detects the size of your gland and any abnormal lumps or growths. An exam of your urine (urinalysis). A prostate specific antigen (PSA) screening. This is a blood test used to screen for prostate cancer. An ultrasound. This test uses sound waves to electronically produce a picture of your prostate gland. Your health care provider may refer you to a specialist in kidney and prostate diseases (urologist). How is this treated? Once symptoms begin, your health care provider will monitor your condition (active surveillance or watchful waiting). Treatment for this condition will depend on the severity of your condition. Treatment may include: Observation and yearly exams. This may be the only treatment needed if your condition and symptoms are mild. Medicines to relieve your symptoms, including: Medicines to shrink the prostate. Medicines to relax the muscle of the prostate. Surgery in severe cases. Surgery may include: Prostatectomy. In this procedure, the prostate tissue is removed completely through an open incision or with a laparoscope or robotics. Transurethral resection of the prostate (TURP). In this procedure, a tool is inserted through the opening at the tip of the penis (urethra). It is used to cut away tissue of the inner core of the prostate. The pieces are removed through the same opening of the penis. This removes the blockage. Transurethral incision (TUIP). In this procedure, small cuts are made in the prostate. This lessens the prostate's pressure on the urethra. Transurethral microwave thermotherapy (TUMT). This procedure uses microwaves to create heat. The heat destroys and removes a small   amount of prostate tissue. Transurethral needle ablation (TUNA). This procedure uses radio frequencies to destroy and remove a small amount of prostate tissue. Interstitial laser coagulation (ILC).  This procedure uses a laser to destroy and remove a small amount of prostate tissue. Transurethral electrovaporization (TUVP). This procedure uses electrodes to destroy and remove a small amount of prostate tissue. Prostatic urethral lift. This procedure inserts an implant to push the lobes of the prostate away from the urethra. Follow these instructions at home: Take over-the-counter and prescription medicines only as told by your health care provider. Monitor your symptoms for any changes. Contact your health care provider with any changes. Avoid drinking large amounts of liquid before going to bed or out in public. Avoid or reduce how much caffeine or alcohol you drink. Give yourself time when you urinate. Keep all follow-up visits as told by your health care provider. This is important. Contact a health care provider if: You have unexplained back pain. Your symptoms do not get better with treatment. You develop side effects from the medicine you are taking. Your urine becomes very dark or has a bad smell. Your lower abdomen becomes distended and you have trouble passing your urine. Get help right away if: You have a fever or chills. You suddenly cannot urinate. You feel lightheaded, or very dizzy, or you faint. There are large amounts of blood or clots in the urine. Your urinary problems become hard to manage. You develop moderate to severe low back or flank pain. The flank is the side of your body between the ribs and the hip. These symptoms may represent a serious problem that is an emergency. Do not wait to see if the symptoms will go away. Get medical help right away. Call your local emergency services (911 in the [place] Do not drive yourself to the hospital. Summary Benign prostatic hyperplasia (BPH) is an enlarged prostate that is caused by the normal aging process and not by cancer. An enlarged prostate can press on the urethra. This can make it hard to pass urine. This  condition is part of a normal aging process and is more likely to develop in men over the age of 50 years. Get help right away if you suddenly cannot urinate. This information is not intended to replace advice given to you by your health care provider. Make sure you discuss any questions you have with your healthcare provider. Document Revised: 12/01/2019 Document Reviewed: 12/01/2019 Elsevier Patient Education  2022 [employer]  

## 2020-10-26 NOTE — Progress Notes (Signed)
10/26/2020 9:22 AM   John [name] 03/19/1946 [id number]  Referring provider: [name], [place],  [place] [address]  Chief Complaint  Patient presents with   Benign Prostatic Hypertrophy    89mow/PVR    HPI: John [name] a 89yohere for BPH and urinary retention. PVR 0. IPSS 3 QOL0. UA normal. No other complaints today. No hematuria.   PMH: Past Medical History:  Diagnosis Date   Adenocarcinoma of lung, right ([facility] 04/18/2016   Anxiety    Arthritis    Asthma    BPH (benign prostatic hyperplasia)    with urinary retention 02/06/20   CHF (congestive heart failure) (HCC)    COPD (chronic obstructive pulmonary disease) ([facility]    Depression    Diabetes mellitus without complication ([facility]    no meds   Diabetes mellitus, type II ([facility]    DM (diabetes mellitus) ([facility] 07/09/2016   Dyspnea    History of kidney stones    History of radiation therapy 06/25/16-08/20/16   right lung   History of radiation therapy 09/17/2020   right lung 09/10/2020-09/17/2020   Dr KSondra Come  Hyperlipidemia    Hypertension    Hypertension    Hypothyroidism    Macular degeneration    Neuropathy    Non-small cell lung cancer, right ([facility] 04/18/2016   Peripheral vascular disease ([facility]    Prostatitis    Pulmonary nodule, left 07/16/2016   Sleep apnea    cpap    Surgical History: Past Surgical History:  Procedure Laterality Date   ABDOMINAL AORTOGRAM W/LOWER EXTREMITY Left 02/06/2020   Procedure: ABDOMINAL AORTOGRAM W/LOWER EXTREMITY;  Surgeon: BLorretta Harp MD;  [facility];  Service: Cardiovascular;  Laterality: Left;   CARDIOVERSION N/A 10/05/2020   Procedure: CARDIOVERSION;  Surgeon: OGeralynn Rile MD;  [facility]  Service: Cardiovascular;  Laterality: N/A;   CATARACT EXTRACTION, BILATERAL Bilateral    ENDARTERECTOMY FEMORAL Right 08/20/2020   Procedure: ENDARTERECTOMY  RIGHT FEMORAL ARTERY;  Surgeon: CMarty Heck  MD;  [place]: [facility]  Service: Vascular;  Laterality: Right;   ESOPHAGOGASTRODUODENOSCOPY (EGD) WITH PROPOFOL N/A 09/06/2020   Procedure: ESOPHAGOGASTRODUODENOSCOPY (EGD) WITH PROPOFOL;  Surgeon: PIrene Shipper MD;  [facility];  Service: Endoscopy;  Laterality: N/A;   INSERTION OF ILIAC STENT Right 08/20/2020   Procedure: RETROGRADE INSERTION OF RIGHT ILIAC STENT;  Surgeon: CMarty Heck MD;  Location: [facility]  Service: Vascular;  Laterality: Right;   INTRAOPERATIVE ARTERIOGRAM Right 08/20/2020   Procedure: INTRA OPERATIVE ARTERIOGRAM ILIAC;  Surgeon: CMarty Heck MD;  [facility]  Service: Vascular;  Laterality: Right;   PATCH ANGIOPLASTY Right 08/20/2020   Procedure: PATCH ANGIOPLASTY RIGHT FEMORAL ARTERY;  Surgeon: CMarty Heck MD;  [place] [facility]  Service: Vascular;  Laterality: Right;   PORTACATH PLACEMENT Left 06/13/2016   Procedure: INSERTION PORT-A-CATH;  Surgeon: MAviva Signs MD;  Location: [facility];  Service: General;  Laterality: Left;   TRANSURETHRAL RESECTION OF PROSTATE N/A 05/31/2020   Procedure: TRANSURETHRAL RESECTION OF THE PROSTATE (TURP);  Surgeon: MCleon Gustin MD;  Location: [facility];  Service: Urology;  Laterality: N/A;   VIDEO BRONCHOSCOPY WITH ENDOBRONCHIAL NAVIGATION N/A 05/28/2016   Procedure: VIDEO BRONCHOSCOPY WITH ENDOBRONCHIAL NAVIGATION;  Surgeon: SMelrose Nakayama MD;  [facility]  Service: Thoracic;  Laterality: N/A;   VIDEO BRONCHOSCOPY WITH ENDOBRONCHIAL ULTRASOUND N/A 05/28/2016   Procedure: VIDEO BRONCHOSCOPY WITH ENDOBRONCHIAL ULTRASOUND;  Surgeon:  [name], MD;  Location: [facility];  Service: Thoracic;  Laterality: N/A;    Home Medications:  Allergies as of 10/26/2020       Reactions   Sulfa Antibiotics Swelling   Mouth swelling   Atorvastatin Other (See Comments)   Muscle aches - tolerating Pravastatin 40 mg MWF   Jardiance [empagliflozin] Other (See Comments)   FEELS SLUGGISH, TIRED    Lopressor [metoprolol] Other (See Comments)   Fatigue   Rosuvastatin Other (See Comments)   Muscle aches - tolerating Pravastatin 40 mg MWF   Temazepam Other (See Comments)   Made insomnia worse         Medication List        Accurate as of October 26, 2020  9:22 AM. If you have any questions, ask your nurse or doctor.          acetaminophen 650 MG CR tablet Commonly known as: TYLENOL Take 650 mg by mouth every 8 (eight) hours as needed for pain.   albuterol 108 (90 Base) MCG/ACT inhaler Commonly known as: VENTOLIN HFA Inhale 2 puffs into the lungs every 4 (four) hours as needed for wheezing or shortness of breath.   apixaban 5 MG Tabs tablet Commonly known as: ELIQUIS Take 1 tablet (5 mg total) by mouth 2 (two) times daily.   clopidogrel 75 MG tablet Commonly known as: PLAVIX Take 1 tablet (75 mg total) by mouth daily.   dutasteride 0.5 MG capsule Commonly known as: AVODART TAKE 1 CAPSULE BY MOUTH EVERY DAY   ezetimibe 10 MG tablet Commonly known as: ZETIA Take 1 tablet (10 mg total) by mouth daily.   fluticasone 50 MCG/ACT nasal spray Commonly known as: FLONASE Place 1 spray into both nostrils daily as needed for allergies or rhinitis.   gabapentin 300 MG capsule Commonly known as: NEURONTIN TAKE 1 CAPSULE BY MOUTH THREE TIMES A DAY   hydrocortisone cream 1 % Apply 1 application topically 2 (two) times daily.   Iron (Ferrous Sulfate) 325 (65 Fe) MG Tabs Take 325 mg by mouth 2 (two) times daily.   levothyroxine 50 MCG tablet Commonly known as: SYNTHROID Take 1 tablet (50 mcg total) by mouth daily.   linaclotide 145 MCG Caps capsule Commonly known as: Linzess Take 1 capsule (145 mcg total) by mouth daily. To regulate bowel movements   metFORMIN 500 MG 24 hr tablet Commonly known as: GLUCOPHAGE-XR TAKE 1 TABLET BY MOUTH EVERY DAY WITH BREAKFAST   mupirocin ointment 2 % Commonly known as: BACTROBAN Apply 1 application topically 2 (two) times daily as  needed (wound care).   ondansetron 4 MG disintegrating tablet Commonly known as: ZOFRAN-ODT Take 1-2 tablets (4-8 mg total) by mouth every 8 (eight) hours as needed for nausea or vomiting.   OneTouch Delica Lancets 00X Misc Use to test blood sugars daily as directed. DX: E11.9   OneTouch Verio Reflect w/Device Kit Use to test blood sugars daily as directed. DX: E11.9   OneTouch Verio test strip Generic drug: glucose blood Use to test blood sugars daily as directed. DX: E11.9   pantoprazole 40 MG tablet Commonly known as: Protonix Take 1 tablet (40 mg total) by mouth daily.   potassium chloride SA 20 MEQ tablet Commonly known as: KLOR-CON Take 1 tablet (20 mEq total) by mouth daily.   pravastatin 40 MG tablet Commonly known as: PRAVACHOL TAKE 1 TABLET (40 MG TOTAL) BY MOUTH ON MONDAYS , WEDNESDAYS, AND FRIDAYS AS DIRECTED   Rybelsus 7 MG Tabs  Generic drug: Semaglutide Take 7 mg by mouth daily.   senna-docusate 8.6-50 MG tablet Commonly known as: Senokot-S Take 1 tablet by mouth 2 (two) times daily between meals as needed for mild constipation.   Torsemide 40 MG Tabs Take 40 mg by mouth daily.   Trelegy Ellipta 100-62.5-25 MCG/INH Aepb Generic drug: Fluticasone-Umeclidin-Vilant Inhale 1 puff into the lungs daily.   zolpidem 5 MG tablet Commonly known as: AMBIEN Take 5 mg by mouth [name] bedtime as needed for sleep.        Allergies:  Allergies  Allergen Reactions   Sulfa Antibiotics Swelling    Mouth swelling   Atorvastatin Other (See Comments)    Muscle aches - tolerating Pravastatin 40 mg MWF   Jardiance [Empagliflozin] Other (See Comments)    FEELS SLUGGISH, TIRED   Lopressor [Metoprolol] Other (See Comments)    Fatigue   Rosuvastatin Other (See Comments)    Muscle aches - tolerating Pravastatin 40 mg MWF   Temazepam Other (See Comments)    Made insomnia worse     Family History: Family History  Problem Relation Age of Onset   Hypertension Mother     Diabetes Father    Heart disease Father    Stroke Father    Hypertension Sister     Social History:  reports that he has been smoking cigarettes. He started smoking about 59 years ago. He has a 27.50 pack-year smoking history. He has never used smokeless tobacco. He reports that he does not drink alcohol and does not use drugs.  ROS: All other review of systems were reviewed and are negative except what is noted above in HPI  Physical Exam: BP 124/70   Pulse (!) 109   Temp 98.2 F (36.8 C)   Constitutional:  Alert and oriented, No acute distress. HEENT: John [name], moist mucus membranes.  Trachea midline, no masses. Cardiovascular: No clubbing, cyanosis, or edema. Respiratory: Normal respiratory effort, no increased work of breathing. GI: Abdomen is soft, nontender, nondistended, no abdominal masses GU: No CVA tenderness.  Lymph: No cervical or inguinal lymphadenopathy. Skin: No rashes, bruises or suspicious lesions. Neurologic: Grossly intact, no focal deficits, moving all 4 extremities. Psychiatric: Normal mood and affect.  Laboratory Data: Lab Results  Component Value Date   WBC 10.0 10/22/2020   HGB 7.8 (LL) 10/22/2020   HCT 25.9 (L) 10/22/2020   MCV 94 10/22/2020   PLT 557 (H) 10/22/2020    Lab Results  Component Value Date   CREATININE 0.89 10/22/2020    No results found for: PSA  No results found for: TESTOSTERONE  Lab Results  Component Value Date   HGBA1C 7.5 (H) 08/20/2020    Urinalysis    Component Value Date/Time   COLORURINE STRAW (A) 08/14/2020 1122   APPEARANCEUR Clear 09/13/2020 1132   LABSPEC 1.008 08/14/2020 1122   PHURINE 7.0 08/14/2020 1122   GLUCOSEU Negative 09/13/2020 [place] 08/14/2020 1122   BILIRUBINUR Negative 09/13/2020 1132   [place] 08/14/2020 1122   PROTEINUR 2+ (A) 09/13/2020 1132   PROTEINUR NEGATIVE 08/14/2020 1122   NITRITE Negative 09/13/2020 1132   NITRITE NEGATIVE 08/14/2020 1122    LEUKOCYTESUR Trace (A) 09/13/2020 1132   LEUKOCYTESUR NEGATIVE 08/14/2020 1122    Lab Results  Component Value Date   LABMICR See below: 09/13/2020   WBCUA 6-10 (A) 09/13/2020   LABEPIT None seen 09/13/2020   MUCUS Present 07/27/2020   BACTERIA Few 09/13/2020    Pertinent Imaging:  Results for  orders placed in visit on 12/30/19  DG Abd 1 View  Narrative CLINICAL DATA:  Abdominal pain  EXAM: ABDOMEN - 1 VIEW  COMPARISON:  06/05/2017  FINDINGS: Scattered large and small bowel gas is noted. Mild retained fecal material is seen without obstructive change. Degenerative change of the lumbar spine is noted. No abnormal calcifications are seen.  IMPRESSION: Mild retained fecal material.   Electronically Signed By: Inez Catalina M.D. On: 01/01/2020 15:15  Results for orders placed during the hospital encounter of 08/28/16  US Venous Img Lower Bilateral  Narrative CLINICAL DATA:  Bilateral leg swelling.  Treatment for lung cancer.  EXAM: BILATERAL LOWER EXTREMITY VENOUS DOPPLER ULTRASOUND  TECHNIQUE: Gray-scale sonography with graded compression, as well as color Doppler and duplex ultrasound were performed to evaluate the lower extremity deep venous systems from the level of the common femoral vein and including the common femoral, femoral, profunda femoral, popliteal and calf veins including the posterior tibial, peroneal and gastrocnemius veins when visible. The superficial great saphenous vein was also interrogated. Spectral Doppler was utilized to evaluate flow [name] rest and with distal augmentation maneuvers in the common femoral, femoral and popliteal veins.  COMPARISON:  None.  FINDINGS: RIGHT LOWER EXTREMITY  Common Femoral Vein: No evidence of thrombus. Normal compressibility, respiratory phasicity and response to augmentation.  Saphenofemoral Junction: No evidence of thrombus. Normal compressibility and flow on color Doppler imaging.  Profunda  Femoral Vein: No evidence of thrombus. Normal compressibility and flow on color Doppler imaging.  Femoral Vein: No evidence of thrombus. Normal compressibility, respiratory phasicity and response to augmentation.  Popliteal Vein: No evidence of thrombus. Normal compressibility, respiratory phasicity and response to augmentation.  Calf Veins: No evidence of thrombus. Normal compressibility and flow on color Doppler imaging.  Superficial Great Saphenous Vein: No evidence of thrombus. Normal compressibility and flow on color Doppler imaging.  Venous Reflux:  None.  Other Findings:  None.  LEFT LOWER EXTREMITY  Common Femoral Vein: No evidence of thrombus. Normal compressibility, respiratory phasicity and response to augmentation.  Saphenofemoral Junction: No evidence of thrombus. Normal compressibility and flow on color Doppler imaging.  Profunda Femoral Vein: No evidence of thrombus. Normal compressibility and flow on color Doppler imaging.  Femoral Vein: No evidence of thrombus. Normal compressibility, respiratory phasicity and response to augmentation.  Popliteal Vein: No evidence of thrombus. Normal compressibility, respiratory phasicity and response to augmentation.  Calf Veins: No evidence of thrombus. Normal compressibility and flow on color Doppler imaging.  Superficial Great Saphenous Vein: No evidence of thrombus. Normal compressibility and flow on color Doppler imaging.  Venous Reflux:  None.  Other Findings:  None.  IMPRESSION: No evidence of DVT within either lower extremity.   Electronically Signed By: Franchot Gallo M.D. On: 08/28/2016 14:17  No results found for this or any previous visit.  No results found for this or any previous visit.  No results found for this or any previous visit.  No results found for this or any previous visit.  No results found for this or any previous visit.  No results found for this or any previous  visit.   Assessment & Plan:    1. BPH with urinary obstruction, urinary retention -Stop dutasteride. RTC 1 year with PVR - Urinalysis, Routine w reflex microscopic - BLADDER SCAN AMB NON-IMAGING   No follow-ups on file.  Nicolette Bang, MD  [facility] [place]

## 2020-10-26 NOTE — Progress Notes (Signed)
Assessment & Plan:  1. Itching - triamcinolone cream (KENALOG) 0.1 %; Apply 1 application topically 2 (two) times daily.  Dispense: 80 g; Refill: 1 - levocetirizine (XYZAL) 5 MG tablet; Take 1 tablet (5 mg total) by mouth at bedtime.  Dispense: 30 tablet; Refill: 2  2. Hallucinations - Urinalysis - CMP14+EGFR - Ammonia  3. Mouth lesion - magic mouthwash (nystatin, lidocaine, diphenhydrAMINE, alum & mag hydroxide) suspension; Swish and swallow 5 mLs 4 (four) times daily.  Dispense: 180 mL; Refill: 0  4. Symptomatic anemia - Anemia Profile B  5. Hypothyroidism, unspecified type - TSH   Follow up plan: Return as scheduled with PCP next week.  Hendricks Limes, MSN, APRN, FNP-C [facility]  Subjective:   Patient ID: John [name], male    DOB: Oct 02, 1945, 75 y.o.   MRN: [id number]  HPI: John [name] is a 75 y.o. male presenting on 10/26/2020 for Rash (Patient states he has had a rash on his back and bilateral arms that have been going on x2 weeks ), Hallucinations (X 2 weeks ), and sores in mouth  Patient is accompanied by his friend, who he is okay with being present.  He reports a rash on his arms, back, and neck for >2 weeks. It does itch and he has been scratching and causing scabbing. He has tried taking Benadryl and applying hydrocortisone cream.  He reports sores in his mouth and his tongue hurts.  States he has also been hallucinating x2 weeks. When asked to explain he gave two examples: His plate was moving and about to fall of the table while he was eating, but when he went to grab it, it was right where it should be. His cat was between his legs walking around which is not possible because she is very sick, in the other room, and unable to get up.    ROS: Negative unless specifically indicated above in HPI.   Relevant past medical history reviewed and updated as indicated.   Allergies and medications reviewed and updated.   Current  Outpatient Medications:    acetaminophen (TYLENOL) 650 MG CR tablet, Take 650 mg by mouth every 8 (eight) hours as needed for pain., Disp: , Rfl:    albuterol (VENTOLIN HFA) 108 (90 Base) MCG/ACT inhaler, Inhale 2 puffs into the lungs every 4 (four) hours as needed for wheezing or shortness of breath., Disp: 8 g, Rfl: 6   apixaban (ELIQUIS) 5 MG TABS tablet, Take 1 tablet (5 mg total) by mouth 2 (two) times daily., Disp: 60 tablet, Rfl: 3   Blood Glucose Monitoring Suppl (ONETOUCH VERIO REFLECT) w/Device KIT, Use to test blood sugars daily as directed. DX: E11.9, Disp: 1 kit, Rfl: 1   clopidogrel (PLAVIX) 75 MG tablet, Take 1 tablet (75 mg total) by mouth daily., Disp: 90 tablet, Rfl: 1   fluticasone (FLONASE) 50 MCG/ACT nasal spray, Place 1 spray into both nostrils daily as needed for allergies or rhinitis., Disp: 18.2 mL, Rfl: 2   Fluticasone-Umeclidin-Vilant (TRELEGY ELLIPTA) 100-62.5-25 MCG/INH AEPB, Inhale 1 puff into the lungs daily., Disp: , Rfl:    gabapentin (NEURONTIN) 300 MG capsule, TAKE 1 CAPSULE BY MOUTH THREE TIMES A DAY, Disp: 270 capsule, Rfl: 0   glucose blood (ONETOUCH VERIO) test strip, Use to test blood sugars daily as directed. DX: E11.9, Disp: 100 each, Rfl: 12   hydrocortisone cream 1 %, Apply 1 application topically 2 (two) times daily., Disp: 56 g, Rfl: 0   Iron,  Ferrous Sulfate, 325 (65 Fe) MG TABS, Take 325 mg by mouth 2 (two) times daily., Disp: 180 tablet, Rfl: 1   levothyroxine (SYNTHROID) 50 MCG tablet, Take 1 tablet (50 mcg total) by mouth daily., Disp: 90 tablet, Rfl: 1   linaclotide (LINZESS) 145 MCG CAPS capsule, Take 1 capsule (145 mcg total) by mouth daily. To regulate bowel movements, Disp: 30 capsule, Rfl: 5   metFORMIN (GLUCOPHAGE-XR) 500 MG 24 hr tablet, TAKE 1 TABLET BY MOUTH EVERY DAY WITH BREAKFAST, Disp: 90 tablet, Rfl: 0   mupirocin ointment (BACTROBAN) 2 %, Apply 1 application topically 2 (two) times daily as needed (wound care)., Disp: , Rfl:     ondansetron (ZOFRAN-ODT) 4 MG disintegrating tablet, Take 1-2 tablets (4-8 mg total) by mouth every 8 (eight) hours as needed for nausea or vomiting., Disp: 60 tablet, Rfl: 1   OneTouch Delica Lancets 02H MISC, Use to test blood sugars daily as directed. DX: E11.9, Disp: 100 each, Rfl: 1   pantoprazole (PROTONIX) 40 MG tablet, Take 1 tablet (40 mg total) by mouth daily., Disp: 90 tablet, Rfl: 1   potassium chloride SA (KLOR-CON) 20 MEQ tablet, Take 1 tablet (20 mEq total) by mouth daily., Disp: 90 tablet, Rfl: 0   pravastatin (PRAVACHOL) 40 MG tablet, TAKE 1 TABLET (40 MG TOTAL) BY MOUTH ON MONDAYS , WEDNESDAYS, AND FRIDAYS AS DIRECTED, Disp: 48 tablet, Rfl: 3   Semaglutide (RYBELSUS) 7 MG TABS, Take 7 mg by mouth daily., Disp: 90 tablet, Rfl: 1   senna-docusate (SENOKOT-S) 8.6-50 MG tablet, Take 1 tablet by mouth 2 (two) times daily between meals as needed for mild constipation., Disp: 180 tablet, Rfl: 0   torsemide 40 MG TABS, Take 40 mg by mouth daily., Disp: 180 tablet, Rfl: 3   zolpidem (AMBIEN) 5 MG tablet, Take 5 mg by mouth at bedtime as needed for sleep., Disp: , Rfl:   Allergies  Allergen Reactions   Sulfa Antibiotics Swelling    Mouth swelling   Atorvastatin Other (See Comments)    Muscle aches - tolerating Pravastatin 40 mg MWF   Jardiance [Empagliflozin] Other (See Comments)    FEELS SLUGGISH, TIRED   Lopressor [Metoprolol] Other (See Comments)    Fatigue   Rosuvastatin Other (See Comments)    Muscle aches - tolerating Pravastatin 40 mg MWF   Temazepam Other (See Comments)    Made insomnia worse     Objective:   BP 118/61   Pulse (!) 107   Temp 98 F (36.7 C) (Temporal)   Ht _0  (1.676 m)   Wt 152 lb 9.6 oz (69.2 kg)   SpO2 98%   BMI 24.63 kg/m    Physical Exam Vitals reviewed.  Constitutional:      General: He is not in acute distress.    Appearance: Normal appearance. He is not ill-appearing, toxic-appearing or diaphoretic.  HENT:     Head: Normocephalic  and atraumatic.     Mouth/Throat:     Mouth: Oral lesions (one white raised spot on the left cheek) present.  Eyes:     General: No scleral icterus.       Right eye: No discharge.        Left eye: No discharge.     Conjunctiva/sclera: Conjunctivae normal.  Cardiovascular:     Rate and Rhythm: Normal rate.  Pulmonary:     Effort: Pulmonary effort is normal. No respiratory distress.  Musculoskeletal:        General: Normal range of motion.  Cervical back: Normal range of motion.  Skin:    General: Skin is warm and dry.     Comments: No visible rash, but patient has scabs and sores all over his arms from the scratching.  Neurological:     Mental Status: He is alert and oriented to person, place, and time. Mental status is at baseline.  Psychiatric:        Mood and Affect: Mood normal.        Behavior: Behavior normal.        Thought Content: Thought content normal.        Judgment: Judgment normal.

## 2020-10-29 ENCOUNTER — Encounter: Payer: Self-pay | Admitting: Family Medicine

## 2020-10-29 DIAGNOSIS — I110 Hypertensive heart disease with heart failure: Secondary | ICD-10-CM | POA: Diagnosis not present

## 2020-10-29 DIAGNOSIS — I4892 Unspecified atrial flutter: Secondary | ICD-10-CM | POA: Diagnosis not present

## 2020-10-29 DIAGNOSIS — Z95820 Peripheral vascular angioplasty status with implants and grafts: Secondary | ICD-10-CM | POA: Diagnosis not present

## 2020-10-29 DIAGNOSIS — I509 Heart failure, unspecified: Secondary | ICD-10-CM | POA: Diagnosis not present

## 2020-10-29 DIAGNOSIS — E1151 Type 2 diabetes mellitus with diabetic peripheral angiopathy without gangrene: Secondary | ICD-10-CM | POA: Diagnosis not present

## 2020-10-29 DIAGNOSIS — Z48812 Encounter for surgical aftercare following surgery on the circulatory system: Secondary | ICD-10-CM | POA: Diagnosis not present

## 2020-10-29 LAB — CMP14+EGFR
ALT: 6 IU/L (ref 0–44)
AST: 14 IU/L (ref 0–40)
Albumin/Globulin Ratio: 1.4 (ref 1.2–2.2)
Albumin: 3.3 g/dL — ABNORMAL LOW (ref 3.7–4.7)
Alkaline Phosphatase: 109 IU/L (ref 44–121)
BUN/Creatinine Ratio: 14 (ref 10–24)
BUN: 11 mg/dL (ref 8–27)
Bilirubin Total: <0.2 mg/dL (ref 0.0–1.2)
CO2: 28 mmol/L (ref 20–29)
Calcium: 8.9 mg/dL (ref 8.6–10.2)
Chloride: 98 mmol/L (ref 96–106)
Creatinine, Ser: 0.77 mg/dL (ref 0.76–1.27)
Globulin, Total: 2.4 g/dL (ref 1.5–4.5)
Glucose: 152 mg/dL — ABNORMAL HIGH (ref 65–99)
Potassium: 3.3 mmol/L — ABNORMAL LOW (ref 3.5–5.2)
Sodium: 143 mmol/L (ref 134–144)
Total Protein: 5.7 g/dL — ABNORMAL LOW (ref 6.0–8.5)
eGFR: 94 mL/min/{1.73_m2} (ref >59)

## 2020-10-29 LAB — ANEMIA PROFILE B
Basophils Absolute: 0 10*3/uL (ref 0.0–0.2)
Basos: 1 %
EOS (ABSOLUTE): 0.2 10*3/uL (ref 0.0–0.4)
Eos: 3 %
Ferritin: 24 ng/mL — ABNORMAL LOW (ref 30–400)
Folate: 9.1 ng/mL (ref >3.0)
Hematocrit: 24.3 % — ABNORMAL LOW (ref 37.5–51.0)
Hemoglobin: 7.4 g/dL — CL (ref 13.0–17.7)
Immature Grans (Abs): 0 10*3/uL (ref 0.0–0.1)
Immature Granulocytes: 0 %
Iron Saturation: 4 % — CL (ref 15–55)
Iron: 16 ug/dL — ABNORMAL LOW (ref 38–169)
Lymphocytes Absolute: 0.5 10*3/uL — ABNORMAL LOW (ref 0.7–3.1)
Lymphs: 8 %
MCH: 28.8 pg (ref 26.6–33.0)
MCHC: 30.5 g/dL — ABNORMAL LOW (ref 31.5–35.7)
MCV: 95 fL (ref 79–97)
Monocytes Absolute: 0.7 10*3/uL (ref 0.1–0.9)
Monocytes: 10 %
Neutrophils Absolute: 5.3 10*3/uL (ref 1.4–7.0)
Neutrophils: 78 %
Platelets: 520 10*3/uL — ABNORMAL HIGH (ref 150–450)
RBC: 2.57 x10E6/uL — CL (ref 4.14–5.80)
RDW: 19.2 % — ABNORMAL HIGH (ref 11.6–15.4)
Retic Ct Pct: 7.6 % — ABNORMAL HIGH (ref 0.6–2.6)
Total Iron Binding Capacity: 411 ug/dL (ref 250–450)
UIBC: 395 ug/dL — ABNORMAL HIGH (ref 111–343)
Vitamin B-12: 378 pg/mL (ref 232–1245)
WBC: 6.7 10*3/uL (ref 3.4–10.8)

## 2020-10-29 LAB — TSH: TSH: 3.41 u[IU]/mL (ref 0.450–4.500)

## 2020-10-29 LAB — AMMONIA: Ammonia: 46 ug/dL (ref 31–169)

## 2020-10-29 NOTE — Progress Notes (Signed)
Called patient, no answer, left message to return call 

## 2020-10-30 ENCOUNTER — Ambulatory Visit: Payer: Medicare Other

## 2020-11-01 ENCOUNTER — Encounter: Payer: Self-pay | Admitting: Family

## 2020-11-01 ENCOUNTER — Ambulatory Visit (INDEPENDENT_AMBULATORY_CARE_PROVIDER_SITE_OTHER): Payer: Medicare Other | Admitting: Family

## 2020-11-01 ENCOUNTER — Other Ambulatory Visit: Payer: Self-pay

## 2020-11-01 VITALS — BP 139/64 | HR 112 | Temp 97.7°F | Ht 66.0 in | Wt 154.8 lb

## 2020-11-01 DIAGNOSIS — D649 Anemia, unspecified: Secondary | ICD-10-CM | POA: Diagnosis not present

## 2020-11-01 DIAGNOSIS — I2510 Atherosclerotic heart disease of native coronary artery without angina pectoris: Secondary | ICD-10-CM | POA: Diagnosis not present

## 2020-11-01 DIAGNOSIS — L299 Pruritus, unspecified: Secondary | ICD-10-CM

## 2020-11-01 DIAGNOSIS — B370 Candidal stomatitis: Secondary | ICD-10-CM | POA: Diagnosis not present

## 2020-11-01 DIAGNOSIS — I5032 Chronic diastolic (congestive) heart failure: Secondary | ICD-10-CM | POA: Diagnosis not present

## 2020-11-01 LAB — HEMOGLOBIN, FINGERSTICK: Hemoglobin: 8 g/dL — ABNORMAL LOW (ref 12.6–17.7)

## 2020-11-01 MED ORDER — NYSTATIN 100000 UNIT/ML MT SUSP: 5.0000 mL | Freq: Four times a day (QID) | OROMUCOSAL | 0 refills | Status: DC

## 2020-11-01 MED ORDER — HYDROXYZINE PAMOATE 25 MG PO CAPS: 25.0000 mg | ORAL_CAPSULE | Freq: Three times a day (TID) | ORAL | 1 refills | Status: DC | PRN

## 2020-11-01 NOTE — Progress Notes (Signed)
Subjective:    Patient ID: John [name], male    DOB: [birth year], 75 y.o.   MRN: [id number]  Chief Complaint  Patient presents with   Follow-up    BREAKING OUT ALL OVER    PT presents to the office today to recheck rash. John [name] was seen in our office on 10/26/20. John [name] was started on xyzal and kenalog cream.   John [name] is also anemic and has intermittent SOB. John [name] is currently taking iron 325 mg BID. John [name] is followed by GI.   John [name] is also complaining of oral thrush. John [name] was given magic mouthwash, but would like to try nystatin.   Pt has CHF and currently taking torsemide 40 mg daily. John [name] has gained 2 lbs since his last visit. States his breathing is stable.  Rash This is a recurrent problem. The current episode started more than 1 month ago. The affected locations include the abdomen, left axilla, right axilla and back. The rash is characterized by redness and itchiness. John [name] was exposed to nothing. Associated symptoms include fatigue.  Congestive Heart Failure Presents for follow-up visit. Associated symptoms include edema and fatigue. Pertinent negatives include no near-syncope. The symptoms have been worsening.     Review of Systems  Constitutional:  Positive for fatigue.  Cardiovascular:  Negative for near-syncope.  Skin:  Positive for rash.  All other systems reviewed and are negative.     Objective:   Physical Exam Vitals reviewed.  Constitutional:      General: John [name] is not in acute distress.    Appearance: John [name] is well-developed.  HENT:     Head: Normocephalic.  Eyes:     General:        Right eye: No discharge.        Left eye: No discharge.     Pupils: Pupils are equal, round, and reactive to light.  Neck:     Thyroid: No thyromegaly.  Cardiovascular:     Rate and Rhythm: Normal rate and regular rhythm.     Heart sounds: Normal heart sounds. No murmur heard. Pulmonary:     Effort: Pulmonary effort is normal. No respiratory distress.     Breath sounds: Decreased breath sounds  present. No wheezing.  Abdominal:     General: Bowel sounds are normal. There is no distension.     Palpations: Abdomen is soft.     Tenderness: There is no abdominal tenderness.  Musculoskeletal:        General: No tenderness. Normal range of motion.     Cervical back: Normal range of motion and neck supple.  Skin:    General: Skin is warm and dry.     Findings: No erythema or rash (no rash present).  Neurological:     Mental Status: John [name] is alert and oriented to person, place, and time.     Cranial Nerves: No cranial nerve deficit.     Deep Tendon Reflexes: Reflexes are normal and symmetric.  Psychiatric:        Behavior: Behavior normal.        Thought Content: Thought content normal.        Judgment: Judgment normal.      BP 139/64   Pulse (!) 112   Temp 97.7 F (36.5 C) (Temporal)   Ht 5\' 6"  (1.676 m)   Wt 154 lb 12.8 oz (70.2 kg)   SpO2 98%   BMI 24.99 kg/m      Assessment & Plan:  John [name] comes in today  with chief complaint of Follow-up (BREAKING OUT ALL OVER )   Diagnosis and orders addressed:  1. Symptomatic anemia - CBC with Differential/Platelet - Hemoglobin, fingerstick  2. Oral thrush Use Nystatin  Rinse after using inhalers - nystatin (MYCOSTATIN) 100000 UNIT/ML suspension; Take 5 mLs (500,000 Units total) by mouth 4 (four) times daily.  Dispense: 60 mL; Refill: 0  3. Pruritus Use Vistaril as needed  Moisturizer as needed  - hydrOXYzine (VISTARIL) 25 MG capsule; Take 1 capsule (25 mg total) by mouth 3 (three) times daily as needed for itching.  Dispense: 60 capsule; Refill: 1  4. Chronic diastolic CHF (congestive heart failure) (HCC) Continue torsemide  Labs pending Health Maintenance reviewed Diet and exercise encouraged  Follow up plan: 1 month   Evelina Dun, FNP

## 2020-11-01 NOTE — Patient Instructions (Signed)
Pruritus Pruritus is an itchy feeling on the skin. One of the most common causes is dry skin, but many different things can cause itching. Most cases of itching do notrequire medical attention. Sometimes itchy skin can turn into a rash. Follow these instructions at home: Skin care  Apply moisturizing lotion to your skin as needed. Lotion that contains petroleum jelly is best. Take medicines or apply medicated creams only as told by your health care provider. This may include: Corticosteroid cream. Anti-itch lotions. Oral antihistamines. Apply a cool, wet cloth (cool compress) to the affected areas. Take baths with one of the following: Epsom salts. You can get these at your local pharmacy or grocery store. Follow the instructions on the packaging. Baking soda. Pour a small amount into the bath as told by your health care provider. Colloidal oatmeal. You can get this at your local pharmacy or grocery store. Follow the instructions on the packaging. Apply baking soda paste to your skin. To make the paste, stir water into a small amount of baking soda until it reaches a paste-like consistency. Do not scratch your skin. Do not take hot showers or baths, which can make itching worse. A cool shower may help with itching as long as you apply moisturizing lotion after the shower. Do not use scented soaps, detergents, perfumes, and cosmetic products. Instead, use gentle, unscented versions of these items.  General instructions Avoid wearing tight clothes. Keep a journal to help find out what is causing your itching. Write down: What you eat and drink. What cosmetic products you use. What soaps or detergents you use. What you wear, including jewelry. Use a humidifier. This keeps the air moist, which helps to prevent dry skin. Be aware of any changes in your itchiness. Contact a health care provider if: The itching does not go away after several days. You are unusually thirsty or urinating more  than normal. Your skin tingles or feels numb. Your skin or the white parts of your eyes turn yellow (jaundice). You feel weak. You have any of the following: Night sweats. Tiredness (fatigue). Weight loss. Abdominal pain. Summary Pruritus is an itchy feeling on the skin. One of the most common causes is dry skin, but many different conditions and factors can cause itching. Apply moisturizing lotion to your skin as needed. Lotion that contains petroleum jelly is best. Take medicines or apply medicated creams only as told by your health care provider. Do not take hot showers or baths. Do not use scented soaps, detergents, perfumes, or cosmetic products. This information is not intended to replace advice given to you by your health care provider. Make sure you discuss any questions you have with your healthcare provider. Document Revised: 04/07/2017 Document Reviewed: 04/07/2017 Elsevier Patient Education  2022 [employer] Inc.  

## 2020-11-02 ENCOUNTER — Telehealth: Payer: Self-pay | Admitting: Family

## 2020-11-02 DIAGNOSIS — L299 Pruritus, unspecified: Secondary | ICD-10-CM

## 2020-11-02 DIAGNOSIS — Z95820 Peripheral vascular angioplasty status with implants and grafts: Secondary | ICD-10-CM | POA: Diagnosis not present

## 2020-11-02 DIAGNOSIS — I4892 Unspecified atrial flutter: Secondary | ICD-10-CM | POA: Diagnosis not present

## 2020-11-02 DIAGNOSIS — Z48812 Encounter for surgical aftercare following surgery on the circulatory system: Secondary | ICD-10-CM | POA: Diagnosis not present

## 2020-11-02 DIAGNOSIS — I110 Hypertensive heart disease with heart failure: Secondary | ICD-10-CM | POA: Diagnosis not present

## 2020-11-02 DIAGNOSIS — I509 Heart failure, unspecified: Secondary | ICD-10-CM | POA: Diagnosis not present

## 2020-11-02 DIAGNOSIS — E1151 Type 2 diabetes mellitus with diabetic peripheral angiopathy without gangrene: Secondary | ICD-10-CM | POA: Diagnosis not present

## 2020-11-02 LAB — CBC WITH DIFFERENTIAL/PLATELET
Basophils Absolute: 0.1 10*3/uL (ref 0.0–0.2)
Basos: 1 %
EOS (ABSOLUTE): 0.2 10*3/uL (ref 0.0–0.4)
Eos: 2 %
Hematocrit: 26.2 % — ABNORMAL LOW (ref 37.5–51.0)
Hemoglobin: 7.8 g/dL — CL (ref 13.0–17.7)
Immature Grans (Abs): 0 10*3/uL (ref 0.0–0.1)
Immature Granulocytes: 0 %
Lymphocytes Absolute: 0.6 10*3/uL — ABNORMAL LOW (ref 0.7–3.1)
Lymphs: 9 %
MCH: 28.2 pg (ref 26.6–33.0)
MCHC: 29.8 g/dL — ABNORMAL LOW (ref 31.5–35.7)
MCV: 95 fL (ref 79–97)
Monocytes Absolute: 0.8 10*3/uL (ref 0.1–0.9)
Monocytes: 10 %
Neutrophils Absolute: 5.7 10*3/uL (ref 1.4–7.0)
Neutrophils: 78 %
Platelets: 416 10*3/uL (ref 150–450)
RBC: 2.77 x10E6/uL — ABNORMAL LOW (ref 4.14–5.80)
RDW: 17.9 % — ABNORMAL HIGH (ref 11.6–15.4)
WBC: 7.4 10*3/uL (ref 3.4–10.8)

## 2020-11-05 DIAGNOSIS — H3530 Unspecified macular degeneration: Secondary | ICD-10-CM | POA: Diagnosis not present

## 2020-11-05 DIAGNOSIS — I509 Heart failure, unspecified: Secondary | ICD-10-CM | POA: Diagnosis not present

## 2020-11-05 DIAGNOSIS — J9611 Chronic respiratory failure with hypoxia: Secondary | ICD-10-CM | POA: Diagnosis not present

## 2020-11-05 DIAGNOSIS — F329 Major depressive disorder, single episode, unspecified: Secondary | ICD-10-CM | POA: Diagnosis not present

## 2020-11-05 DIAGNOSIS — I4892 Unspecified atrial flutter: Secondary | ICD-10-CM | POA: Diagnosis not present

## 2020-11-05 DIAGNOSIS — Z9181 History of falling: Secondary | ICD-10-CM | POA: Diagnosis not present

## 2020-11-05 DIAGNOSIS — C3491 Malignant neoplasm of unspecified part of right bronchus or lung: Secondary | ICD-10-CM | POA: Diagnosis not present

## 2020-11-05 DIAGNOSIS — Z7901 Long term (current) use of anticoagulants: Secondary | ICD-10-CM | POA: Diagnosis not present

## 2020-11-05 DIAGNOSIS — G4733 Obstructive sleep apnea (adult) (pediatric): Secondary | ICD-10-CM | POA: Diagnosis not present

## 2020-11-05 DIAGNOSIS — Z4801 Encounter for change or removal of surgical wound dressing: Secondary | ICD-10-CM | POA: Diagnosis not present

## 2020-11-05 DIAGNOSIS — N401 Enlarged prostate with lower urinary tract symptoms: Secondary | ICD-10-CM | POA: Diagnosis not present

## 2020-11-05 DIAGNOSIS — Z48812 Encounter for surgical aftercare following surgery on the circulatory system: Secondary | ICD-10-CM | POA: Diagnosis not present

## 2020-11-05 DIAGNOSIS — I110 Hypertensive heart disease with heart failure: Secondary | ICD-10-CM | POA: Diagnosis not present

## 2020-11-05 DIAGNOSIS — R338 Other retention of urine: Secondary | ICD-10-CM | POA: Diagnosis not present

## 2020-11-05 DIAGNOSIS — E785 Hyperlipidemia, unspecified: Secondary | ICD-10-CM | POA: Diagnosis not present

## 2020-11-05 DIAGNOSIS — F419 Anxiety disorder, unspecified: Secondary | ICD-10-CM | POA: Diagnosis not present

## 2020-11-05 DIAGNOSIS — I2510 Atherosclerotic heart disease of native coronary artery without angina pectoris: Secondary | ICD-10-CM | POA: Diagnosis not present

## 2020-11-05 DIAGNOSIS — F17210 Nicotine dependence, cigarettes, uncomplicated: Secondary | ICD-10-CM | POA: Diagnosis not present

## 2020-11-05 DIAGNOSIS — K219 Gastro-esophageal reflux disease without esophagitis: Secondary | ICD-10-CM | POA: Diagnosis not present

## 2020-11-05 DIAGNOSIS — E1140 Type 2 diabetes mellitus with diabetic neuropathy, unspecified: Secondary | ICD-10-CM | POA: Diagnosis not present

## 2020-11-05 DIAGNOSIS — M1990 Unspecified osteoarthritis, unspecified site: Secondary | ICD-10-CM | POA: Diagnosis not present

## 2020-11-05 DIAGNOSIS — E1151 Type 2 diabetes mellitus with diabetic peripheral angiopathy without gangrene: Secondary | ICD-10-CM | POA: Diagnosis not present

## 2020-11-05 DIAGNOSIS — J449 Chronic obstructive pulmonary disease, unspecified: Secondary | ICD-10-CM | POA: Diagnosis not present

## 2020-11-05 DIAGNOSIS — E039 Hypothyroidism, unspecified: Secondary | ICD-10-CM | POA: Diagnosis not present

## 2020-11-05 DIAGNOSIS — Z9981 Dependence on supplemental oxygen: Secondary | ICD-10-CM | POA: Diagnosis not present

## 2020-11-05 NOTE — Telephone Encounter (Signed)
(  Key: [id number]) hydrOXYzine Pamoate 25MG  capsules Sent to [facility] today

## 2020-11-06 NOTE — Telephone Encounter (Signed)
Approvedon August 1 Approved. This drug has been approved under the Member's Medicare Part D benefit. Approved quantity: 60 units per 20 day(s). You may fill up to a 90 day supply except for those on Specialty Tier 5, which can be filled up to a 30 day supply. Please call the pharmacy to process the prescription claim.  [facility] aware

## 2020-11-07 ENCOUNTER — Inpatient Hospital Stay: Payer: Medicare Other | Admitting: Primary Care

## 2020-11-07 DIAGNOSIS — I509 Heart failure, unspecified: Secondary | ICD-10-CM | POA: Diagnosis not present

## 2020-11-07 DIAGNOSIS — Z4801 Encounter for change or removal of surgical wound dressing: Secondary | ICD-10-CM | POA: Diagnosis not present

## 2020-11-07 DIAGNOSIS — E1151 Type 2 diabetes mellitus with diabetic peripheral angiopathy without gangrene: Secondary | ICD-10-CM | POA: Diagnosis not present

## 2020-11-07 DIAGNOSIS — I110 Hypertensive heart disease with heart failure: Secondary | ICD-10-CM | POA: Diagnosis not present

## 2020-11-07 DIAGNOSIS — I4892 Unspecified atrial flutter: Secondary | ICD-10-CM | POA: Diagnosis not present

## 2020-11-07 DIAGNOSIS — Z48812 Encounter for surgical aftercare following surgery on the circulatory system: Secondary | ICD-10-CM | POA: Diagnosis not present

## 2020-11-08 ENCOUNTER — Other Ambulatory Visit: Payer: Self-pay

## 2020-11-08 ENCOUNTER — Encounter: Payer: Self-pay | Admitting: Internal Medicine

## 2020-11-08 ENCOUNTER — Ambulatory Visit (INDEPENDENT_AMBULATORY_CARE_PROVIDER_SITE_OTHER): Payer: Medicare Other | Admitting: Internal Medicine

## 2020-11-08 DIAGNOSIS — I2510 Atherosclerotic heart disease of native coronary artery without angina pectoris: Secondary | ICD-10-CM

## 2020-11-08 DIAGNOSIS — F17210 Nicotine dependence, cigarettes, uncomplicated: Secondary | ICD-10-CM | POA: Diagnosis not present

## 2020-11-08 DIAGNOSIS — J449 Chronic obstructive pulmonary disease, unspecified: Secondary | ICD-10-CM | POA: Diagnosis not present

## 2020-11-08 MED ORDER — TRELEGY ELLIPTA 100-62.5-25 MCG/INH IN AEPB: 1.0000 | INHALATION_SPRAY | Freq: Every day | RESPIRATORY_TRACT | 11 refills | Status: DC

## 2020-11-08 NOTE — Progress Notes (Signed)
John [name], male    DOB: May 17, 1945,    MRN: [id number]   Brief patient profile:  74 yowm active smoker/MM with onset of sob around 2015 dx as copd at [facility] with GOLD III criteria then dx  RUL lung ca [name] nav bx then  rx chemo/RT summary rx     Non-small cell lung cancer, right ([place]) 1.  Stage IIIa (T1b N2 cM0) NSCLC, favoring adenocarcinoma: -Chemo XRT with weekly carboplatin and paclitaxel completed on 08/20/2016. -Consolidation durvalumab from 09/26/2016 through 09/25/2017.     History of Present Illness  10/24/2019  Pulmonary/ 1st office eval/[facility] Dercole on breo 200/ incruse and lisinopril  Chief Complaint  Patient presents with   Consult    Patient has shortness of breath with exertion. When resting he is fine. Productive cough with clear sputum. Had lung cancer and has COPD.   Dyspnea:  Walking x mailbox = 25 ft slt incline  Cough: am mucoid clear  Sleep: flat bed/ 2 pillows/ cpap  SABA use:  q 6h prn  rec Stop lisinopril substitute  benicar 40 mg daily in place of lisinopril  - if blood pressure too low then reduce or stop the amlodipine as it is likely contributing to your fluid in your legs Stop breo and incruse  and substitute Trelogy one click am two good drags hold at least 5 seconds and out thru the nose Only use your albuterol as a rescue medication  The key is to stop smoking completely before smoking completely stops you!   12/14/2019  f/u ov/[place] [facility] office/Dixie Coppa re: GOLD III/ still smoking  Chief Complaint  Patient presents with   Follow-up    shortness of breath with exertion  Dyspnea:  Slowed down by R leg claudication  Cough: am congestion few minutes  Sleeping: flat bed 2 pillows and CPAP per [place] SABA use:  Sometimes at hs  02: none  Rec No change rx   11/08/2020 post hosp  f/u ov/Chanelle Hodsdon re:  GOLD III / still smoking  Maint trelegy  Chief Complaint  Patient presents with   Follow-up    F/U coughing and SOB. Inhalers working  well.  Dyspnea:  slowed by R hip / mailbox and slt uphill 50 ft does ok  Cough: some am congestion > clear  mcus  Sleeping: flat bed / cpap no resp cc  SABA use: not much  02: not using  Covid status:   vax x 3    No obvious day to day or daytime variability or assoc excess/ purulent sputum or mucus plugs or hemoptysis or cp or chest tightness, subjective wheeze or overt sinus or hb symptoms.   Sleeping as above  without nocturnal  or early am exacerbation  of respiratory  c/o's or need for noct saba. Also denies any obvious fluctuation of symptoms with weather or environmental changes or other aggravating or alleviating factors except as outlined above   No unusual exposure hx or h/o childhood pna/ asthma or knowledge of premature birth.  Current Allergies, Complete Past Medical History, Past Surgical History, Family History, and Social History were reviewed in [employer] record.  ROS  The following are not active complaints unless bolded Hoarseness, sore throat, dysphagia, dental problems, itching, sneezing,  nasal congestion or discharge of excess mucus or purulent secretions, ear ache,   fever, chills, sweats, unintended wt loss or wt gain, classically pleuritic or exertional cp,  orthopnea pnd or arm/hand swelling  or leg swelling, presyncope, palpitations,  abdominal pain, anorexia, nausea, vomiting, diarrhea  or change in bowel habits or change in bladder habits, change in stools or change in urine, dysuria, hematuria,  rash, arthralgias, visual complaints, headache, numbness, weakness or ataxia or problems with walking or coordination,  change in mood or  memory.        Current Meds  Medication Sig   acetaminophen (TYLENOL) 650 MG CR tablet Take 650 mg by mouth every 8 (eight) hours as needed for pain.   albuterol (VENTOLIN HFA) 108 (90 Base) MCG/ACT inhaler Inhale 2 puffs into the lungs every 4 (four) hours as needed for wheezing or shortness of breath.    apixaban (ELIQUIS) 5 MG TABS tablet Take 1 tablet (5 mg total) by mouth 2 (two) times daily.   Blood Glucose Monitoring Suppl (ONETOUCH VERIO REFLECT) w/Device KIT Use to test blood sugars daily as directed. DX: E11.9   clopidogrel (PLAVIX) 75 MG tablet Take 1 tablet (75 mg total) by mouth daily.   fluticasone (FLONASE) 50 MCG/ACT nasal spray Place 1 spray into both nostrils daily as needed for allergies or rhinitis.   Fluticasone-Umeclidin-Vilant (TRELEGY ELLIPTA) 100-62.5-25 MCG/INH AEPB Inhale 1 puff into the lungs daily.   gabapentin (NEURONTIN) 300 MG capsule TAKE 1 CAPSULE BY MOUTH THREE TIMES A DAY   glucose blood (ONETOUCH VERIO) test strip Use to test blood sugars daily as directed. DX: E11.9   hydrOXYzine (VISTARIL) 25 MG capsule Take 1 capsule (25 mg total) by mouth 3 (three) times daily as needed for itching.   Iron, Ferrous Sulfate, 325 (65 Fe) MG TABS Take 325 mg by mouth 2 (two) times daily.   levocetirizine (XYZAL) 5 MG tablet Take 1 tablet (5 mg total) by mouth at bedtime.   levothyroxine (SYNTHROID) 50 MCG tablet Take 1 tablet (50 mcg total) by mouth daily.   linaclotide (LINZESS) 145 MCG CAPS capsule Take 1 capsule (145 mcg total) by mouth daily. To regulate bowel movements   metFORMIN (GLUCOPHAGE-XR) 500 MG 24 hr tablet TAKE 1 TABLET BY MOUTH EVERY DAY WITH BREAKFAST   mupirocin ointment (BACTROBAN) 2 % Apply 1 application topically 2 (two) times daily as needed (wound care).   nystatin (MYCOSTATIN) 100000 UNIT/ML suspension Take 5 mLs (500,000 Units total) by mouth 4 (four) times daily.   ondansetron (ZOFRAN-ODT) 4 MG disintegrating tablet Take 1-2 tablets (4-8 mg total) by mouth every 8 (eight) hours as needed for nausea or vomiting.   OneTouch Delica Lancets 17E MISC Use to test blood sugars daily as directed. DX: E11.9   pantoprazole (PROTONIX) 40 MG tablet Take 1 tablet (40 mg total) by mouth daily.   potassium chloride SA (KLOR-CON) 20 MEQ tablet Take 1 tablet (20 mEq  total) by mouth daily.   pravastatin (PRAVACHOL) 40 MG tablet TAKE 1 TABLET (40 MG TOTAL) BY MOUTH ON MONDAYS , WEDNESDAYS, AND FRIDAYS AS DIRECTED   Semaglutide (RYBELSUS) 7 MG TABS Take 7 mg by mouth daily.   senna-docusate (SENOKOT-S) 8.6-50 MG tablet Take 1 tablet by mouth 2 (two) times daily between meals as needed for mild constipation.   torsemide 40 MG TABS Take 40 mg by mouth daily.   triamcinolone cream (KENALOG) 0.1 % Apply 1 application topically 2 (two) times daily.   zolpidem (AMBIEN) 5 MG tablet Take 5 mg by mouth at bedtime as needed for sleep.               Past Medical History:  Diagnosis Date   Adenocarcinoma of lung, right ([place]) 04/18/2016  Anxiety    Arthritis    Asthma    CHF (congestive heart failure) (HCC)    COPD (chronic obstructive pulmonary disease) ([place])    Depression    Diabetes mellitus without complication ([place])    no meds   DM (diabetes mellitus) ([place]) 07/09/2016   Dyspnea    History of kidney stones    Hyperlipidemia    Hypertension    Macular degeneration    Neuropathy    Non-small cell lung cancer, right ([place]) 04/18/2016   Prostatitis    Pulmonary nodule, left 07/16/2016   Sleep apnea    cpap      Objective:     11/08/2020         151    12/14/19 183 lb 9.6 oz (83.3 kg)  12/07/19 183 lb 3.2 oz (83.1 kg)  11/24/19 183 lb 3.2 oz (83.1 kg)    Vital signs reviewed  11/08/2020  - Note at rest 02 sats  97% on RA   General appearance:    amb wm nad   HEENT : pt wearing mask not removed for exam due to covid - 19 concerns.    NECK :  without JVD/Nodes/TM/ nl carotid upstrokes bilaterally   LUNGS: no acc muscle use,  Mild barrel  contour chest wall with bilateral  Distant bs s audible wheeze and  without cough on insp or exp maneuvers  and mild  Hyperresonant  to  percussion bilaterally     CV:  RRR  no s3 or murmur or increase in P2, and no edema   ABD:  soft and nontender with pos end  insp Hoover's  in the supine position. No bruits  or organomegaly appreciated, bowel sounds nl  MS:   Nl gait/  ext warm without deformities, calf tenderness, cyanosis or clubbing No obvious joint restrictions   SKIN: warm and dry without lesions    NEURO:  alert, approp, nl sensorium with  no motor or cerebellar deficits apparent.    I personally reviewed images and agree with radiology impression as follows:  CXR:   10/09/20  Emphysematous and chronic bronchitic changes in the lungs. No evidence of active pulmonary disease.        Assessment

## 2020-11-08 NOTE — Patient Instructions (Addendum)
No change in medications   We will walk you today to get a baseline 0xygen level walking   Please schedule a follow up visit in 12 months but call sooner if needed

## 2020-11-08 NOTE — Assessment & Plan Note (Signed)
Counseled re importance of smoking cessation but did not meet time criteria for separate billing     Each maintenance medication was reviewed in detail including emphasizing most importantly the difference between maintenance and prns and under what circumstances the prns are to be triggered using an action plan format where appropriate.  Total time for H and P, chart review, counseling, reviewing dpi/hfa device(s) , directly observing portions of ambulatory 02 saturation study/ and generating customized AVS unique to this office visit / same day charting = 20 min

## 2020-11-08 NOTE — Assessment & Plan Note (Signed)
Active smoker - Spirometry 10/24/2019  FEV1 0.94 (32%)  Ratio 0.48 - 10/24/2019  After extensive coaching inhaler device,  effectiveness =    90% > change breo 200/ incruse to Trelegy 100 q am  - alpha one AT screen  12/14/2019 >>>   MM level 147  - 11/08/2020   Walked on ra x one lap =  approx 250 ft -@ relatively slow  Pace withcane stopped due to leg pain with  min sob / sats 93%   Group D in terms of symptom/risk and laba/lama/ICS  therefore appropriate rx at this point >>>  Continue trelegy and approp saba   Re saba: I spent extra time with pt today reviewing appropriate use of albuterol for prn use on exertion with the following points: 1) saba is for relief of sob that does not improve by walking a slower pace or resting but rather if the pt does not improve after trying this first. 2) If the pt is convinced, as many are, that saba helps recover from activity faster then it's easy to tell if this is the case by re-challenging : ie stop, take the inhaler, then p 5 minutes try the exact same activity (intensity of workload) that just caused the symptoms and see if they are substantially diminished or not after saba 3) if there is an activity that reproducibly causes the symptoms, try the saba 15 min before the activity on alternate days   If in fact the saba really does help, then fine to continue to use it prn but advised may need to look closer at the maintenance regimen being used to achieve better control of airways disease with exertion.

## 2020-11-09 ENCOUNTER — Ambulatory Visit (INDEPENDENT_AMBULATORY_CARE_PROVIDER_SITE_OTHER): Payer: Medicare Other

## 2020-11-09 DIAGNOSIS — Z48812 Encounter for surgical aftercare following surgery on the circulatory system: Secondary | ICD-10-CM

## 2020-11-09 DIAGNOSIS — Z9181 History of falling: Secondary | ICD-10-CM

## 2020-11-09 DIAGNOSIS — J9611 Chronic respiratory failure with hypoxia: Secondary | ICD-10-CM

## 2020-11-09 DIAGNOSIS — E1151 Type 2 diabetes mellitus with diabetic peripheral angiopathy without gangrene: Secondary | ICD-10-CM | POA: Diagnosis not present

## 2020-11-09 DIAGNOSIS — J449 Chronic obstructive pulmonary disease, unspecified: Secondary | ICD-10-CM | POA: Diagnosis not present

## 2020-11-09 DIAGNOSIS — R338 Other retention of urine: Secondary | ICD-10-CM

## 2020-11-09 DIAGNOSIS — F419 Anxiety disorder, unspecified: Secondary | ICD-10-CM

## 2020-11-09 DIAGNOSIS — I2510 Atherosclerotic heart disease of native coronary artery without angina pectoris: Secondary | ICD-10-CM | POA: Diagnosis not present

## 2020-11-09 DIAGNOSIS — I509 Heart failure, unspecified: Secondary | ICD-10-CM | POA: Diagnosis not present

## 2020-11-09 DIAGNOSIS — Z4801 Encounter for change or removal of surgical wound dressing: Secondary | ICD-10-CM

## 2020-11-09 DIAGNOSIS — F329 Major depressive disorder, single episode, unspecified: Secondary | ICD-10-CM

## 2020-11-09 DIAGNOSIS — Z7984 Long term (current) use of oral hypoglycemic drugs: Secondary | ICD-10-CM

## 2020-11-09 DIAGNOSIS — H3530 Unspecified macular degeneration: Secondary | ICD-10-CM

## 2020-11-09 DIAGNOSIS — I4892 Unspecified atrial flutter: Secondary | ICD-10-CM

## 2020-11-09 DIAGNOSIS — Z7902 Long term (current) use of antithrombotics/antiplatelets: Secondary | ICD-10-CM

## 2020-11-09 DIAGNOSIS — I110 Hypertensive heart disease with heart failure: Secondary | ICD-10-CM

## 2020-11-09 DIAGNOSIS — G4733 Obstructive sleep apnea (adult) (pediatric): Secondary | ICD-10-CM

## 2020-11-09 DIAGNOSIS — Z7951 Long term (current) use of inhaled steroids: Secondary | ICD-10-CM

## 2020-11-09 DIAGNOSIS — Z7901 Long term (current) use of anticoagulants: Secondary | ICD-10-CM

## 2020-11-09 DIAGNOSIS — N401 Enlarged prostate with lower urinary tract symptoms: Secondary | ICD-10-CM

## 2020-11-09 DIAGNOSIS — E1140 Type 2 diabetes mellitus with diabetic neuropathy, unspecified: Secondary | ICD-10-CM

## 2020-11-09 DIAGNOSIS — M1990 Unspecified osteoarthritis, unspecified site: Secondary | ICD-10-CM

## 2020-11-09 DIAGNOSIS — Z79899 Other long term (current) drug therapy: Secondary | ICD-10-CM

## 2020-11-09 DIAGNOSIS — C3491 Malignant neoplasm of unspecified part of right bronchus or lung: Secondary | ICD-10-CM | POA: Diagnosis not present

## 2020-11-09 DIAGNOSIS — F17210 Nicotine dependence, cigarettes, uncomplicated: Secondary | ICD-10-CM

## 2020-11-09 DIAGNOSIS — Z9981 Dependence on supplemental oxygen: Secondary | ICD-10-CM

## 2020-11-09 DIAGNOSIS — K219 Gastro-esophageal reflux disease without esophagitis: Secondary | ICD-10-CM

## 2020-11-09 DIAGNOSIS — E785 Hyperlipidemia, unspecified: Secondary | ICD-10-CM

## 2020-11-09 DIAGNOSIS — E039 Hypothyroidism, unspecified: Secondary | ICD-10-CM

## 2020-11-14 DIAGNOSIS — Z4801 Encounter for change or removal of surgical wound dressing: Secondary | ICD-10-CM | POA: Diagnosis not present

## 2020-11-14 DIAGNOSIS — I509 Heart failure, unspecified: Secondary | ICD-10-CM | POA: Diagnosis not present

## 2020-11-14 DIAGNOSIS — I110 Hypertensive heart disease with heart failure: Secondary | ICD-10-CM | POA: Diagnosis not present

## 2020-11-14 DIAGNOSIS — I4892 Unspecified atrial flutter: Secondary | ICD-10-CM | POA: Diagnosis not present

## 2020-11-14 DIAGNOSIS — E1151 Type 2 diabetes mellitus with diabetic peripheral angiopathy without gangrene: Secondary | ICD-10-CM | POA: Diagnosis not present

## 2020-11-14 DIAGNOSIS — Z48812 Encounter for surgical aftercare following surgery on the circulatory system: Secondary | ICD-10-CM | POA: Diagnosis not present

## 2020-11-20 NOTE — Progress Notes (Addendum)
Cardiology Office Note:    Date:  11/29/2020   ID:  John [name], DOB [birth year], MRN [id number]  PCP:  [name], FNP  Cardiologist:  Evalina Field, MD  Electrophysiologist:  None   Referring MD: [name], FNP   Chief Complaint: follow-up of CHF  History of Present Illness:    John [name] is a 75 y.o. male with a history of coronary calcifications noted on prior CT with no evidence of ischemia on nuclear stress test in 15/1834, chronic diastolic CHF, paroxysmal atrial flutter s/p DCCV on 10/05/2020, PAD s/p right femoral endarterectomy and right common iliac stent in 08/2020, COPD followed by Pulmonology, hypertension, hyperlipidemia intolerant to statins, type 2 diabetes mellitus, hypothyroidism, gastric ulcers with recurrent anemia requiring multiple transfusions, and non-small cell lung cancer with recurrence who was previously followed by Dr. Dwana Curd and is now followed by Dr. Audie Box and presents today for follow-up of CHF.  Patient was admitted in 08/2020 following planned vascular surgery (right femoral endarterectomy and right common iliac stent) with Dr. Loletta Specter. While working with PT following surgery, he developed significant shortness of breath and was noted to be tachycardic with rates in the 170s. EKG showed atrial flutter. Cardiology was consulted. Echo showed LVEF of 60-65% with normal wall motion. He was started on Eliquis and restarted on home Toprol-XL. Plan was to focus on rate control and then bring him back for outpatient DCCV after 3 weeks of uninterrupted anticoagulation. He was readmitted in early 09/2020 for symptomatic anemia with hemoglobin as low as 7.3. EGD showed tiny gastric ulcer without bleeding or stigmata. He was then found to have a right inguinal hematoma related to his recent vascular procedure which was felt to be the major cause of his anemia. Eliquis was held for a few days.   Patient was seen by Dr. Audie Box on 09/12/2020 at which time  he was still in atrial flutter with rates in the low 100s. He was reportedly unable to tolerate Toprol-XL. He was back on Eliquis and Plavix. He was noted to be volume overloaded so his Lasix was stopped and he was switched to Torsemide. Outpatient DCCV was arranged and performed on 10/05/2020. DCCV was successful. Patient was then admitted from 10/09/2020 to 10/10/2020 with symptomatic anemia after presenting with fatigue and lightheadedness. Hemoglobin dropped as low as 6.3. Patient had not been taking his PPI. He required 2 units of PRBCs with improvement. Hemoglobin stabilized and he was felt to be stable for discharged. He was restarted on PPI and encouraged to take this as recommended. Patient was last seen by Dr. Audie Box on 10/16/2020 at which time he was still noted to be volume overloaded. BNP was elevated at 228. Torsemide was increased to twice daily for 5 days.  Patient presents today for follow-up visit.  Here with caretaker.  He states that over the last week he has had 2 mechanical falls where he fell to the ground and hit his head hard.  During the first fall, he got tripped up on a rug.  During the second fall, he slipped on something on the ground.  He reports he has had a lot of balance/gait issues for the last couple months.  Ambulating with a cane in the office today.  His most recent fall was yesterday and he hit his head and his left arm.  He is now having a lot of left arm pain and is not able to raise his arm fully above his head.  He also has some left wrist swelling.  He is scheduled to see his PCP for this tomorrow.  However he is concerned about hitting his head twice given that he is on both Eliquis and Plavix.  No neurologic symptoms.  Denies any chest pain and reports his breathing is stable.  His lower extremity edema has improved greatly and is almost completely resolved today on exam.  His weights are stable on his home scale around 154 to 155 pounds.  His caretaker said he is watching  his sodium intake.  He does state that his heart rates have been elevated in the low 100s he denies any palpitations, lightheadedness, dizziness, syncope.  Past Medical History:  Diagnosis Date   Adenocarcinoma of lung, right ([place]) 04/18/2016   Anxiety    Arthritis    Asthma    BPH (benign prostatic hyperplasia)    with urinary retention 02/06/20   CHF (congestive heart failure) ([facility])    COPD (chronic obstructive pulmonary disease) ([place])    Depression    Diabetes mellitus without complication ([place])    no meds   Diabetes mellitus, type II ([place])    DM (diabetes mellitus) ([place]) 07/09/2016   Dyspnea    History of kidney stones    History of radiation therapy 06/25/16-08/20/16   right lung   History of radiation therapy 09/17/2020   right lung 09/10/2020-09/17/2020   Dr Sondra Come   Hyperlipidemia    Hypertension    Hypertension    Hypothyroidism    Macular degeneration    Neuropathy    Non-small cell lung cancer, right ([place]) 04/18/2016   Peripheral vascular disease ([place])    Prostatitis    Pulmonary nodule, left 07/16/2016   Sleep apnea    cpap    Past Surgical History:  Procedure Laterality Date   ABDOMINAL AORTOGRAM W/LOWER EXTREMITY Left 02/06/2020   Procedure: ABDOMINAL AORTOGRAM W/LOWER EXTREMITY;  Surgeon: Lorretta Harp, MD;  Location: [facility];  Service: Cardiovascular;  Laterality: Left;   CARDIOVERSION N/A 10/05/2020   Procedure: CARDIOVERSION;  Surgeon: Geralynn Rile, MD;  Location: [facility];  Service: Cardiovascular;  Laterality: N/A;   CATARACT EXTRACTION, BILATERAL Bilateral    ENDARTERECTOMY FEMORAL Right 08/20/2020   Procedure: ENDARTERECTOMY  RIGHT FEMORAL ARTERY;  Surgeon: Marty Heck, MD;  Location: [place];  Service: Vascular;  Laterality: Right;   ESOPHAGOGASTRODUODENOSCOPY (EGD) WITH PROPOFOL N/A 09/06/2020   Procedure: ESOPHAGOGASTRODUODENOSCOPY (EGD) WITH PROPOFOL;  Surgeon: Irene Shipper, MD;  Location: [facility];  Service:  Endoscopy;  Laterality: N/A;   INSERTION OF ILIAC STENT Right 08/20/2020   Procedure: RETROGRADE INSERTION OF RIGHT ILIAC STENT;  Surgeon: Marty Heck, MD;  Location: [place];  Service: Vascular;  Laterality: Right;   INTRAOPERATIVE ARTERIOGRAM Right 08/20/2020   Procedure: INTRA OPERATIVE ARTERIOGRAM ILIAC;  Surgeon: Marty Heck, MD;  Location: [place];  Service: Vascular;  Laterality: Right;   PATCH ANGIOPLASTY Right 08/20/2020   Procedure: PATCH ANGIOPLASTY RIGHT FEMORAL ARTERY;  Surgeon: Marty Heck, MD;  Location: [facility];  Service: Vascular;  Laterality: Right;   PORTACATH PLACEMENT Left 06/13/2016   Procedure: INSERTION PORT-A-CATH;  Surgeon: Aviva Signs, MD;  Location: [facility];  Service: General;  Laterality: Left;   TRANSURETHRAL RESECTION OF PROSTATE N/A 05/31/2020   Procedure: TRANSURETHRAL RESECTION OF THE PROSTATE (TURP);  Surgeon: Cleon Gustin, MD;  Location: [facility];  Service: Urology;  Laterality: N/A;   VIDEO BRONCHOSCOPY WITH ENDOBRONCHIAL NAVIGATION N/A 05/28/2016   Procedure:  VIDEO BRONCHOSCOPY WITH ENDOBRONCHIAL NAVIGATION;  Surgeon: [name], MD;  Location: [facility];  Service: Thoracic;  Laterality: N/A;   VIDEO BRONCHOSCOPY WITH ENDOBRONCHIAL ULTRASOUND N/A 05/28/2016   Procedure: VIDEO BRONCHOSCOPY WITH ENDOBRONCHIAL ULTRASOUND;  Surgeon: [name], MD;  Location: [facility];  Service: Thoracic;  Laterality: N/A;    Current Medications: Current Meds  Medication Sig   acetaminophen (TYLENOL) 650 MG CR tablet Take 650 mg by mouth every 8 (eight) hours as needed for pain.   albuterol (VENTOLIN HFA) 108 (90 Base) MCG/ACT inhaler Inhale 2 puffs into the lungs every 4 (four) hours as needed for wheezing or shortness of breath.   apixaban (ELIQUIS) 5 MG TABS tablet Take 1 tablet (5 mg total) by mouth 2 (two) times daily.   Blood Glucose Monitoring Suppl (ONETOUCH VERIO REFLECT) w/Device KIT Use to test blood sugars daily as directed. DX: E11.9    clopidogrel (PLAVIX) 75 MG tablet Take 1 tablet (75 mg total) by mouth daily.   fluticasone (FLONASE) 50 MCG/ACT nasal spray Place 1 spray into both nostrils daily as needed for allergies or rhinitis.   Fluticasone-Umeclidin-Vilant (TRELEGY ELLIPTA) 100-62.5-25 MCG/INH AEPB Inhale 1 puff into the lungs daily.   gabapentin (NEURONTIN) 300 MG capsule TAKE 1 CAPSULE BY MOUTH THREE TIMES A DAY   glucose blood (ONETOUCH VERIO) test strip Use to test blood sugars daily as directed. DX: E11.9   hydrOXYzine (VISTARIL) 25 MG capsule Take 1 capsule (25 mg total) by mouth 3 (three) times daily as needed for itching.   Iron, Ferrous Sulfate, 325 (65 Fe) MG TABS Take 325 mg by mouth 2 (two) times daily.   levocetirizine (XYZAL) 5 MG tablet Take 1 tablet (5 mg total) by mouth at bedtime.   linaclotide (LINZESS) 145 MCG CAPS capsule Take 1 capsule (145 mcg total) by mouth daily. To regulate bowel movements   metFORMIN (GLUCOPHAGE-XR) 500 MG 24 hr tablet TAKE 1 TABLET BY MOUTH EVERY DAY WITH BREAKFAST   mupirocin ointment (BACTROBAN) 2 % Apply 1 application topically 2 (two) times daily as needed (wound care).   nystatin (MYCOSTATIN) 100000 UNIT/ML suspension Take 5 mLs (500,000 Units total) by mouth 4 (four) times daily.   ondansetron (ZOFRAN-ODT) 4 MG disintegrating tablet Take 1-2 tablets (4-8 mg total) by mouth every 8 (eight) hours as needed for nausea or vomiting.   OneTouch Delica Lancets 61B MISC Use to test blood sugars daily as directed. DX: E11.9   pantoprazole (PROTONIX) 40 MG tablet Take 1 tablet (40 mg total) by mouth daily.   potassium chloride SA (KLOR-CON) 20 MEQ tablet Take 1 tablet (20 mEq total) by mouth daily.   pravastatin (PRAVACHOL) 40 MG tablet TAKE 1 TABLET (40 MG TOTAL) BY MOUTH ON MONDAYS , WEDNESDAYS, AND FRIDAYS AS DIRECTED   Semaglutide (RYBELSUS) 7 MG TABS Take 7 mg by mouth daily.   senna-docusate (SENOKOT-S) 8.6-50 MG tablet Take 1 tablet by mouth 2 (two) times daily between  meals as needed for mild constipation.   torsemide 40 MG TABS Take 40 mg by mouth daily.   triamcinolone cream (KENALOG) 0.1 % Apply 1 application topically 2 (two) times daily.   zolpidem (AMBIEN) 5 MG tablet Take 5 mg by mouth at bedtime as needed for sleep.     Allergies:   Sulfa antibiotics, Atorvastatin, Jardiance [empagliflozin], Lopressor [metoprolol], Rosuvastatin, and Temazepam   Social History   Socioeconomic History   Marital status: Soil scientist    Spouse name: Not on file  Number of children: 5   Years of education: 10   Highest education level: 10th grade  Occupational History   Occupation: Retired  Tobacco Use   Smoking status: Every Day    Packs/day: 0.50    Years: 55.00    Pack years: 27.50    Types: Cigarettes    Start date: 03/11/1961   Smokeless tobacco: Never   Tobacco comments:    1/2 pack to 1 pack per day 12/14/19  Vaping Use   Vaping Use: Never used  Substance and Sexual Activity   Alcohol use: No   Drug use: No   Sexual activity: Yes    Birth control/protection: None  Other Topics Concern   Not on file  Social History Narrative   [name] is his POA and lives with him. Children out of town - one in [facility], others in [place].   Social Determinants of Health   Financial Resource Strain: Low Risk    Difficulty of Paying Living Expenses: Not hard at all  Food Insecurity: No Food Insecurity   Worried About Charity fundraiser in the Last Year: Never true   [place] in the Last Year: Never true  Transportation Needs: No Transportation Needs   Lack of Transportation (Medical): No   Lack of Transportation (Non-Medical): No  Physical Activity: Unknown   Days of Exercise per Week: 7 days   Minutes of Exercise per Session: Not on file  Stress: No Stress Concern Present   Feeling of Stress : Not at all  Social Connections: Moderately Isolated   Frequency of Communication with Friends and Family: More than three times a week    Frequency of Social Gatherings with Friends and Family: Once a week   Attends Religious Services: Never   Marine scientist or Organizations: No   Attends Music therapist: Never   Marital Status: Living with partner     Family History: The patient's family history includes Diabetes in his father; Heart disease in his father; Hypertension in his mother and sister; Stroke in his father.  ROS:   Please see the history of present illness.     EKGs/Labs/Other Studies Reviewed:    The following studies were reviewed today:  Myoview 03/12/2018: No diagnostic ST segment changes to indicate ischemia. Medium sized, severe intensity, inferior defect extending from apex to base with inferior septal defect at the base. These regions are fixed and suggestive of scar versus soft tissue attenuation. This is an intermediate risk study. Nuclear stress EF: 48%. Visually, LVEF appears normal, consider echocardiogram if not done recently.  _______________  Echocardiogram 08/21/2020: Impressions: 1. Left ventricular ejection fraction, by estimation, is 60 to 65%. The  left ventricle has normal function. The left ventricle has no regional  wall motion abnormalities. Left ventricular diastolic parameters are  indeterminate.   2. Right ventricular systolic function is mildly reduced. The right  ventricular size is normal.   3. The mitral valve is abnormal. No evidence of mitral valve  regurgitation. The mean mitral valve gradient is 4.0 mmHg with average  heart rate of 87 bpm. Moderate mitral annular calcification.   4. The aortic valve is calcified. Aortic valve regurgitation is not  visualized. Mild to moderate aortic valve sclerosis/calcification is  present. Aortic valve area, by VTI measures 0.91 cm. Aortic valve mean  gradient measures 8.0 mmHg. Aortic valve  Vmax measures 1.88 m/s. Aortic valve acceleration time measures 97 msec.  There is discordance between  the AVA and  gradients; DVI is 0.36 with LV  stroke volume index of 30; Aortic stenosis may be underestimated in this  study. Consider outpatient  monitoring if clinically indicated.   Comparison(s): A prior study was performed on 01/15/2018. Prior images  reviewed side by side. Similar peak aortic gradients.   EKG:  EKG ordered today. EKG personally reviewed and demonstrates borderline sinus tachycardia, rate 100 bpm, with 1st degree AV block but no acute ST/T changes. Normal axis. QTc 464 ms.  Recent Labs: 10/10/2020: Magnesium 1.9 10/16/2020: BNP 228.9 10/26/2020: ALT 6; BUN 11; Creatinine, Ser 0.77; Potassium 3.3; Sodium 143; TSH 3.410 11/01/2020: Hemoglobin 7.8; Platelets 416  Recent Lipid Panel    Component Value Date/Time   CHOL 126 08/21/2020 0530   CHOL 136 04/25/2019 1608   TRIG 89 08/21/2020 0530   HDL 28 (L) 08/21/2020 0530   HDL 28 (L) 04/25/2019 1608   CHOLHDL 4.5 08/21/2020 0530   VLDL 18 08/21/2020 0530   LDLCALC 80 08/21/2020 0530   LDLCALC 78 04/25/2019 1608    Physical Exam:    Vital Signs: BP 122/60 (BP Location: Right Arm)   Pulse (!) 105   Ht 5' 6"  (1.676 m)   Wt 156 lb 6.4 oz (70.9 kg)   SpO2 99%   BMI 25.24 kg/m     Wt Readings from Last 3 Encounters:  11/29/20 156 lb 6.4 oz (70.9 kg)  11/08/20 151 lb 12.8 oz (68.9 kg)  11/01/20 154 lb 12.8 oz (70.2 kg)     General: 74 y.o. male in no acute distress. HEENT: Normocephalic and atraumatic. Sclera clear. EOMs intact. Neck: Supple. No carotid bruits. No JVD. Heart: Tachycardic with normal rhythm. No murmurs, gallops, or rubs appreciated.. Radial pulses 2+ and equal bilaterally. Lungs: No increased work of breathing. Clear to ausculation bilaterally. No wheezes, rhonchi, or rales.  Abdomen: Soft, non-distended, and non-tender to palpation.  Musculoskeletal: Unable to raise left arm above head with passive or active motion. Extremities: Trace lower extremity edema bilaterally. Edema noted of left wrist from fall.   Skin: Warm and dry. Neuro: Alert and oriented x3. No focal deficits. No facial asymmetry. Unable to adequately assess strength of left upper extremity given injury but strength normal in all other extremities. Psych: Normal affect. Responds appropriately.   Assessment:    1. Chronic diastolic CHF (congestive heart failure) ([place])   2. Atrial flutter, unspecified type ([place])   3. Primary hypertension   4. Hyperlipidemia, unspecified hyperlipidemia type   5. Type 2 diabetes mellitus with complication, without long-term current use of insulin ([place])   6. Chronic anemia   7. Fall, initial encounter   8. Chronic anticoagulation   9. Injury of left upper arm, initial encounter     Plan:    Chronic Diastolic CHF - Echo in 06/3580 showed LVEF of 60-65% with normal wall motion. RV normal in size with mildly reduced systolic function. - Appears euvolemic. - Continue Torsemide 48m daily. Can take an extra 470min the afternoon as needed for weight gain, worsening lower extremity edema, or worsening shortness of breath. Patient should take an extra dose of potassium chloride 20 mEq if he needs the extra dose of Torsemide. - Discussed importance of daily weights and sodium/fluid restrictions.  Atrial Flutter - S/p DCCV in 10/05/2020. Maintaining sinus rhythm but rates elevated in the low 100s. - Unable to tolerate Metoprolol due to significant fatigue. Discussed with Pharmacy, will start Nebivolol 2.26m61maily given history of COPD. Suspect  this will need to be up-titrated. - Continue chronic anticoagulation with Eliquis 108m twice daily.  PAD - S/p right femoral endarterectomy and right common iliac stent in 08/2020. - Continue Plavix 720mdaily. - Followed by Vascular Surgery.  Hypertension - BP well controlled. - Not currently on any antihypertensives but adding Nebivolol as above for additional rate control.  Hyperlipidemia - Unable to tolerate high-intensity statins in the past.  -  Continue Pravastatin 4055maily M/W/F.  Type 2 Diabetes Mellitus - Management per PCP.  Anemia - History of recurrent anemia. Required transfusion during hospitalization in 10/2020.  - Hemoglobin very low but stable at 7.8 at last check on 11/01/2020.  - Recommend rechecking CBC tomorrow at PCPs visit given fall.  Fall Left Arm Injury - Patient has had 2 mechanical falls over the last week where he his his head.  - Patient concerned about brain bleed given he is on Plavix and Eliquis. No neurologic deficits on exam. However, I am also worried he could have a small brain bleed. Will order non-contrast head CT. - Patient injured left arm in fall yesterday. Recommended icing wrist and shoulder tonight. He is scheduled to see his PCP for this tomorrow. Also recommended discussing gait/balance issues with PCP tomorrow. May need PT or additional Neuro evaluation.  Disposition: Follow up in 3 months with Dr. O'NAudie Box Medication Adjustments/Labs and Tests Ordered: Current medicines are reviewed at length with the patient today.  Concerns regarding medicines are outlined above.  Orders Placed This Encounter  Procedures   CT HEAD WO CONTRAST (5MM)   EKG 12-Lead    No orders of the defined types were placed in this encounter.   Patient Instructions  Medication Instructions:  No Changes *If you need a refill on your cardiac medications before your next appointment, please call your pharmacy*   Lab Work: No Labs If you have labs (blood work) drawn today and your tests are completely normal, you will receive your results only by: MyCFairfieldf you have MyChart) OR A paper copy in the mail If you have any lab test that is abnormal or we need to change your treatment, we will call you to review the results.   Testing/Procedures: [id number]-Cardiac CT scanning, (CAT scanning), is a noninvasive, special x-ray that produces cross-sectional images  of the body using x-rays and a computer. CT scans help physicians diagnose and treat medical conditions. For some CT exams, a contrast material is used to enhance visibility in the area of the body being studied. CT scans provide greater clarity and reveal more details than regular x-ray exams.    Follow-Up: [facility] and your health needs are our priority.  As part of our continuing mission to provide you with exceptional heart care, we have created designated Provider Care Teams.  These Care Teams include your primary Cardiologist (physician) and Advanced Practice Providers (APPs -  Physician Assistants and Nurse Practitioners) who all work together to provide you with the care you need, when you need it.  We recommend signing up for the patient portal called "MyChart".  Sign up information is provided on this After Visit Summary.  MyChart is used to connect with patients for Virtual Visits (Telemedicine).  Patients are able to view lab/test results, encounter notes, upcoming appointments, etc.  Non-urgent messages can be sent to your provider as well.   To learn more about what you can do with MyChart, go to [email or web address]  Your next appointment:   3 month(s)  The format for your next appointment:   In Person  Provider:   Eleonore Chiquito, MD   Other Instructions   Heart Failure Education:  Weigh yourself EVERY morning after you go to the bathroom but before you eat or drink anything. Write this number down in a weight log/diary. If you gain 3 pounds overnight or 5 pounds in a week, call the office. Take your medicines as prescribed. If you have concerns about your medications, please call us before you stop taking them. Eat low salt foods--Limit salt (sodium) to 2000 mg per day. This will help prevent your body from holding onto fluid. Read food labels as many processed foods have a lot of sodium, especially canned goods and prepackaged meats. If you would like some  assistance choosing low sodium foods, we would be happy to set you up with a nutritionist. Limit all fluids for the day to less than 2 liters (64 ounces). Fluid includes all drinks, coffee, juice, ice chips, soup, jello, and all other liquids. Stay as active as you can everyday. Staying active will give you more energy and make your muscles stronger. Start with 5 minutes at a time and work your way up to 30 minutes a day. Break up your activities--do some in the morning and some in the afternoon. Start with 3 days per week and work your way up to 5 days as you can.  If you have chest pain, feel short of breath, dizzy, or lightheaded, STOP. If you don't feel better after a short rest, call 911. If you do feel better, call the office to let us know you have symptoms with exercise.  Can Take Additional Torsemide along with Additional Potassium for weight gain and swelling.   Signed, Darreld Mclean, PA-C  11/29/2020 5:37 PM    [facility]

## 2020-11-21 DIAGNOSIS — E1151 Type 2 diabetes mellitus with diabetic peripheral angiopathy without gangrene: Secondary | ICD-10-CM | POA: Diagnosis not present

## 2020-11-21 DIAGNOSIS — Z4801 Encounter for change or removal of surgical wound dressing: Secondary | ICD-10-CM | POA: Diagnosis not present

## 2020-11-21 DIAGNOSIS — I110 Hypertensive heart disease with heart failure: Secondary | ICD-10-CM | POA: Diagnosis not present

## 2020-11-21 DIAGNOSIS — I4892 Unspecified atrial flutter: Secondary | ICD-10-CM | POA: Diagnosis not present

## 2020-11-21 DIAGNOSIS — I509 Heart failure, unspecified: Secondary | ICD-10-CM | POA: Diagnosis not present

## 2020-11-21 DIAGNOSIS — Z48812 Encounter for surgical aftercare following surgery on the circulatory system: Secondary | ICD-10-CM | POA: Diagnosis not present

## 2020-11-27 ENCOUNTER — Encounter (HOSPITAL_COMMUNITY): Payer: Medicare Other

## 2020-11-28 DIAGNOSIS — I509 Heart failure, unspecified: Secondary | ICD-10-CM | POA: Diagnosis not present

## 2020-11-28 DIAGNOSIS — I4892 Unspecified atrial flutter: Secondary | ICD-10-CM | POA: Diagnosis not present

## 2020-11-28 DIAGNOSIS — E1151 Type 2 diabetes mellitus with diabetic peripheral angiopathy without gangrene: Secondary | ICD-10-CM | POA: Diagnosis not present

## 2020-11-28 DIAGNOSIS — Z48812 Encounter for surgical aftercare following surgery on the circulatory system: Secondary | ICD-10-CM | POA: Diagnosis not present

## 2020-11-28 DIAGNOSIS — I110 Hypertensive heart disease with heart failure: Secondary | ICD-10-CM | POA: Diagnosis not present

## 2020-11-28 DIAGNOSIS — Z4801 Encounter for change or removal of surgical wound dressing: Secondary | ICD-10-CM | POA: Diagnosis not present

## 2020-11-29 ENCOUNTER — Encounter: Payer: Self-pay | Admitting: Student

## 2020-11-29 ENCOUNTER — Other Ambulatory Visit: Payer: Self-pay

## 2020-11-29 ENCOUNTER — Ambulatory Visit (INDEPENDENT_AMBULATORY_CARE_PROVIDER_SITE_OTHER): Payer: Medicare Other | Admitting: Student

## 2020-11-29 VITALS — BP 122/60 | HR 105 | Ht 66.0 in | Wt 156.4 lb

## 2020-11-29 DIAGNOSIS — I5032 Chronic diastolic (congestive) heart failure: Secondary | ICD-10-CM | POA: Diagnosis not present

## 2020-11-29 DIAGNOSIS — E785 Hyperlipidemia, unspecified: Secondary | ICD-10-CM | POA: Diagnosis not present

## 2020-11-29 DIAGNOSIS — I10 Essential (primary) hypertension: Secondary | ICD-10-CM | POA: Diagnosis not present

## 2020-11-29 DIAGNOSIS — E118 Type 2 diabetes mellitus with unspecified complications: Secondary | ICD-10-CM | POA: Diagnosis not present

## 2020-11-29 DIAGNOSIS — R531 Weakness: Secondary | ICD-10-CM

## 2020-11-29 DIAGNOSIS — D649 Anemia, unspecified: Secondary | ICD-10-CM | POA: Diagnosis not present

## 2020-11-29 DIAGNOSIS — R296 Repeated falls: Secondary | ICD-10-CM | POA: Diagnosis not present

## 2020-11-29 DIAGNOSIS — S4992XA Unspecified injury of left shoulder and upper arm, initial encounter: Secondary | ICD-10-CM

## 2020-11-29 DIAGNOSIS — I4892 Unspecified atrial flutter: Secondary | ICD-10-CM | POA: Diagnosis not present

## 2020-11-29 DIAGNOSIS — W19XXXA Unspecified fall, initial encounter: Secondary | ICD-10-CM

## 2020-11-29 DIAGNOSIS — Z7901 Long term (current) use of anticoagulants: Secondary | ICD-10-CM

## 2020-11-29 MED ORDER — POTASSIUM CHLORIDE CRYS ER 20 MEQ PO TBCR: 20.0000 meq | EXTENDED_RELEASE_TABLET | Freq: Every day | ORAL | 0 refills | Status: DC

## 2020-11-29 MED ORDER — TORSEMIDE 40 MG PO TABS: 40.0000 mg | ORAL_TABLET | Freq: Every day | ORAL | 3 refills | Status: DC

## 2020-11-29 NOTE — Patient Instructions (Signed)
Medication Instructions:  No Changes *If you need a refill on your cardiac medications before your next appointment, please call your pharmacy*   Lab Work: No Labs If you have labs (blood work) drawn today and your tests are completely normal, you will receive your results only by: [place] (if you have MyChart) OR A paper copy in the mail If you have any lab test that is abnormal or we need to change your treatment, we will call you to review the results.   Testing/Procedures: [facility], [address] Non-Cardiac CT scanning, (CAT scanning), is a noninvasive, special x-ray that produces cross-sectional images of the body using x-rays and a computer. CT scans help physicians diagnose and treat medical conditions. For some CT exams, a contrast material is used to enhance visibility in the area of the body being studied. CT scans provide greater clarity and reveal more details than regular x-ray exams.    Follow-Up: At [facility], you and your health needs are our priority.  As part of our continuing mission to provide you with exceptional heart care, we have created designated Provider Care Teams.  These Care Teams include your primary Cardiologist (physician) and Advanced Practice Providers (APPs -  Physician Assistants and Nurse Practitioners) who all work together to provide you with the care you need, when you need it.  We recommend signing up for the patient portal called "MyChart".  Sign up information is provided on this After Visit Summary.  MyChart is used to connect with patients for Virtual Visits (Telemedicine).  Patients are able to view lab/test results, encounter notes, upcoming appointments, etc.  Non-urgent messages can be sent to your provider as well.   To learn more about what you can do with MyChart, go to [email or web address].    Your next appointment:   3 month(s)  The format for your next appointment:   In Person  Provider:    Eleonore Chiquito, MD   Other Instructions   Heart Failure Education:  Weigh yourself EVERY morning after you go to the bathroom but before you eat or drink anything. Write this number down in a weight log/diary. If you gain 3 pounds overnight or 5 pounds in a week, call the office. Take your medicines as prescribed. If you have concerns about your medications, please call us before you stop taking them. Eat low salt foods--Limit salt (sodium) to 2000 mg per day. This will help prevent your body from holding onto fluid. Read food labels as many processed foods have a lot of sodium, especially canned goods and prepackaged meats. If you would like some assistance choosing low sodium foods, we would be happy to set you up with a nutritionist. Limit all fluids for the day to less than 2 liters (64 ounces). Fluid includes all drinks, coffee, juice, ice chips, soup, jello, and all other liquids. Stay as active as you can everyday. Staying active will give you more energy and make your muscles stronger. Start with 5 minutes at a time and work your way up to 30 minutes a day. Break up your activities--do some in the morning and some in the afternoon. Start with 3 days per week and work your way up to 5 days as you can.  If you have chest pain, feel short of breath, dizzy, or lightheaded, STOP. If you don't feel better after a short rest, call 911. If you do feel better, call the office to let us know you have symptoms with exercise.  Can Take Additional Torsemide along with Additional Potassium for weight gain and swelling.

## 2020-11-30 ENCOUNTER — Telehealth: Payer: Self-pay | Admitting: Cardiovascular Disease

## 2020-11-30 ENCOUNTER — Ambulatory Visit (HOSPITAL_COMMUNITY)
Admission: RE | Admit: 2020-11-30 | Discharge: 2020-11-30 | Disposition: A | Discharge status: Home / Self Care | Payer: Medicare Other | Source: Ambulatory Visit | Attending: Student | Admitting: Student

## 2020-11-30 ENCOUNTER — Ambulatory Visit (HOSPITAL_COMMUNITY)
Admission: RE | Admit: 2020-11-30 | Discharge: 2020-11-30 | Disposition: A | Discharge status: Home / Self Care | Payer: Medicare Other | Source: Ambulatory Visit | Attending: Family | Admitting: Family

## 2020-11-30 ENCOUNTER — Encounter: Payer: Self-pay | Admitting: Family

## 2020-11-30 ENCOUNTER — Telehealth: Payer: Self-pay | Admitting: Student

## 2020-11-30 ENCOUNTER — Ambulatory Visit (INDEPENDENT_AMBULATORY_CARE_PROVIDER_SITE_OTHER): Payer: Medicare Other | Admitting: Family

## 2020-11-30 VITALS — BP 147/80 | HR 102 | Temp 97.6°F | Ht 66.0 in | Wt 155.0 lb

## 2020-11-30 DIAGNOSIS — W19XXXA Unspecified fall, initial encounter: Secondary | ICD-10-CM

## 2020-11-30 DIAGNOSIS — G4700 Insomnia, unspecified: Secondary | ICD-10-CM | POA: Diagnosis not present

## 2020-11-30 DIAGNOSIS — R531 Weakness: Secondary | ICD-10-CM | POA: Insufficient documentation

## 2020-11-30 DIAGNOSIS — M25512 Pain in left shoulder: Secondary | ICD-10-CM | POA: Insufficient documentation

## 2020-11-30 DIAGNOSIS — Y92009 Unspecified place in unspecified non-institutional (private) residence as the place of occurrence of the external cause: Secondary | ICD-10-CM | POA: Diagnosis not present

## 2020-11-30 DIAGNOSIS — I2510 Atherosclerotic heart disease of native coronary artery without angina pectoris: Secondary | ICD-10-CM

## 2020-11-30 DIAGNOSIS — M19012 Primary osteoarthritis, left shoulder: Secondary | ICD-10-CM | POA: Diagnosis not present

## 2020-11-30 DIAGNOSIS — M25532 Pain in left wrist: Secondary | ICD-10-CM | POA: Insufficient documentation

## 2020-11-30 DIAGNOSIS — Z7901 Long term (current) use of anticoagulants: Secondary | ICD-10-CM | POA: Insufficient documentation

## 2020-11-30 DIAGNOSIS — I6782 Cerebral ischemia: Secondary | ICD-10-CM | POA: Diagnosis not present

## 2020-11-30 DIAGNOSIS — M7989 Other specified soft tissue disorders: Secondary | ICD-10-CM | POA: Diagnosis not present

## 2020-11-30 DIAGNOSIS — R296 Repeated falls: Secondary | ICD-10-CM | POA: Diagnosis not present

## 2020-11-30 DIAGNOSIS — S4992XA Unspecified injury of left shoulder and upper arm, initial encounter: Secondary | ICD-10-CM | POA: Diagnosis not present

## 2020-11-30 DIAGNOSIS — S0990XA Unspecified injury of head, initial encounter: Secondary | ICD-10-CM | POA: Insufficient documentation

## 2020-11-30 MED ORDER — ZOLPIDEM TARTRATE 5 MG PO TABS: 5.0000 mg | ORAL_TABLET | Freq: Every evening | ORAL | 2 refills | Status: DC | PRN

## 2020-11-30 MED ORDER — TRAMADOL HCL 50 MG PO TABS
50.0000 mg | ORAL_TABLET | Freq: Three times a day (TID) | ORAL | 0 refills | Status: DC | PRN
Start: 2020-11-30 — End: 2020-12-03

## 2020-11-30 NOTE — Progress Notes (Signed)
Subjective:    Patient ID: John [name], male    DOB: 1946/04/01, 75 y.o.   MRN: [id number]  Chief Complaint  Patient presents with   [place] twice hit head and both arms hurt. Heart Dr. Was seen yesterday and they have ordered CT of head    PT presents to the office today for bilateral shoulder pain. He states he fell last week. He states he got his foot hung in a rug and fell and hit the back of his head. He denies any loss of conscientiousness, but states he "saw stars". Then two days he was walking to his kitchen and slipped on some water on the floor. He states he landed on his shoulders and head.   His Cardiologists yesterday and she ordered a CT head that is pending.  Fall Pertinent negatives include no numbness or tingling.  Shoulder Pain  The pain is present in the left shoulder and left wrist. This is a new problem. The current episode started in the past 7 days. There has been a history of trauma. The problem occurs constantly. The quality of the pain is described as aching. The pain is at a severity of 10/10. Associated symptoms include joint swelling and a limited range of motion. Pertinent negatives include no numbness or tingling. He has tried acetaminophen and rest for the symptoms. The treatment provided mild relief.  Insomnia Primary symptoms: difficulty falling asleep, somnolence.   The current episode started more than one year. The onset quality is gradual. The problem occurs intermittently.     Review of Systems  Neurological:  Negative for tingling and numbness.  Psychiatric/Behavioral:  The patient has insomnia.   All other systems reviewed and are negative.     Objective:   Physical Exam Vitals reviewed.  Constitutional:      General: He is not in acute distress.    Appearance: He is well-developed. He is obese.  HENT:     Head: Normocephalic.     Right Ear: Tympanic membrane normal.     Left Ear: Tympanic membrane normal.  Eyes:     General:         Right eye: No discharge.        Left eye: No discharge.     Pupils: Pupils are equal, round, and reactive to light.  Neck:     Thyroid: No thyromegaly.  Cardiovascular:     Rate and Rhythm: Normal rate and regular rhythm.     Heart sounds: Normal heart sounds. No murmur heard. Pulmonary:     Effort: Pulmonary effort is normal. No respiratory distress.     Breath sounds: Normal breath sounds. No wheezing.  Abdominal:     General: Bowel sounds are normal. There is no distension.     Palpations: Abdomen is soft.     Tenderness: There is no abdominal tenderness.  Musculoskeletal:        General: Swelling (left swelling) and tenderness present.     Cervical back: Normal range of motion and neck supple.     Comments: Left shoulder pain with abduction, unable to lift greater than 20 degrees  Skin:    General: Skin is warm and dry.     Findings: No erythema or rash.  Neurological:     Mental Status: He is alert and oriented to person, place, and time.     Cranial Nerves: No cranial nerve deficit.     Deep Tendon Reflexes: Reflexes are normal  and symmetric.  Psychiatric:        Behavior: Behavior normal.        Thought Content: Thought content normal.        Judgment: Judgment normal.      BP (!) 147/80   Pulse (!) 102   Temp 97.6 F (36.4 C) (Temporal)   Ht 5\' 6"  (1.676 m)   Wt 155 lb (70.3 kg)   BMI 25.02 kg/m      Assessment & Plan:  [name] comes in today with chief complaint of Fall [place] twice hit head and both arms hurt. Heart Dr. Was seen yesterday and they have ordered CT of head )   Diagnosis and orders addressed:  1. Fall in home, initial encounter - traMADol (ULTRAM) 50 MG tablet; Take 1 tablet (50 mg total) by mouth every 8 (eight) hours as needed for up to 5 days.  Dispense: 30 tablet; Refill: 0  2. Acute pain of left shoulder - DG Shoulder Left - traMADol (ULTRAM) 50 MG tablet; Take 1 tablet (50 mg total) by mouth every 8 (eight) hours as  needed for up to 5 days.  Dispense: 30 tablet; Refill: 0  3. Left wrist pain - DG Wrist Complete Left - traMADol (ULTRAM) 50 MG tablet; Take 1 tablet (50 mg total) by mouth every 8 (eight) hours as needed for up to 5 days.  Dispense: 30 tablet; Refill: 0  4. Insomnia, unspecified type - zolpidem (AMBIEN) 5 MG tablet; Take 1 tablet (5 mg total) by mouth at bedtime as needed for sleep.  Dispense: 30 tablet; Refill: 2   X-ray pending  Ultram given today for pain Fall risks discussed Keep follow up appt scheduled on Monday   Evelina Dun, [place]

## 2020-11-30 NOTE — Telephone Encounter (Signed)
[facility] called with a call report for this patient's CT.   Per the CT Tech, there was no acute intercranial abnormalities.

## 2020-11-30 NOTE — Telephone Encounter (Signed)
Patient returned call. I informed him, per RN, the order has been faxed.

## 2020-11-30 NOTE — Telephone Encounter (Signed)
CT Tech from [facility] called to confirm our office has received stat head CT report. Nurse confirmed report is in epic and will forward to ordering provider.

## 2020-11-30 NOTE — Telephone Encounter (Signed)
Thank you. Reviewed head CT results - no acute findings. Patient has been notified of results.

## 2020-11-30 NOTE — Patient Instructions (Signed)
Shoulder Pain °Many things can cause shoulder pain, including: °An injury to the shoulder. °Overuse of the shoulder. °Arthritis. °The source of the pain can be: °Inflammation. °An injury to the shoulder joint. °An injury to a tendon, ligament, or bone. °Follow these instructions at home: °Pay attention to changes in your symptoms. Let your health care provider know about them. Follow these instructions to relieve your pain. °If you have a sling: °Wear the sling as told by your health care provider. Remove it only as told by your health care provider. °Loosen the sling if your fingers tingle, become numb, or turn cold and blue. °Keep the sling clean. °If the sling is not waterproof: °Do not let it get wet. Remove it to shower or bathe. °Move your arm as little as possible, but keep your hand moving to prevent swelling. °Managing pain, stiffness, and swelling ° °If directed, put ice on the painful area: °Put ice in a plastic bag. °Place a towel between your skin and the bag. °Leave the ice on for 20 minutes, 2-3 times per day. Stop applying ice if it does not help with the pain. °Squeeze a soft ball or a foam pad as much as possible. This helps to keep the shoulder from swelling. It also helps to strengthen the arm. °General instructions °Take over-the-counter and prescription medicines only as told by your health care provider. °Keep all follow-up visits as told by your health care provider. This is important. °Contact a health care provider if: °Your pain gets worse. °Your pain is not relieved with medicines. °New pain develops in your arm, hand, or fingers. °Get help right away if: °Your arm, hand, or fingers: °Tingle. °Become numb. °Become swollen. °Become painful. °Turn white or blue. °Summary °Shoulder pain can be caused by an injury, overuse, or arthritis. °Pay attention to changes in your symptoms. Let your health care provider know about them. °This condition may be treated with a sling, ice, and pain  medicines. °Contact your health care provider if the pain gets worse or new pain develops. Get help right away if your arm, hand, or fingers tingle or become numb, swollen, or painful. °Keep all follow-up visits as told by your health care provider. This is important. °This information is not intended to replace advice given to you by your health care provider. Make sure you discuss any questions you have with your health care provider. °Document Revised: 10/06/2017 Document Reviewed: 10/06/2017 °Elsevier Patient Education © 2022 [employer] ° °

## 2020-11-30 NOTE — Addendum Note (Signed)
Addended by: Waylan Rocher on: 11/30/2020 09:45 AM   Modules accepted: Orders

## 2020-11-30 NOTE — Telephone Encounter (Signed)
No answer on pts number... order faxed per request.

## 2020-11-30 NOTE — Telephone Encounter (Signed)
Follow Up:      Patient said he is upposed to be getting a CT of the head. He said [facility] was booked up and was told to call the hospital. The hospital said he would need an order faxed to them at [phone].

## 2020-12-02 DIAGNOSIS — I110 Hypertensive heart disease with heart failure: Secondary | ICD-10-CM | POA: Diagnosis not present

## 2020-12-02 DIAGNOSIS — I4892 Unspecified atrial flutter: Secondary | ICD-10-CM | POA: Diagnosis not present

## 2020-12-02 DIAGNOSIS — Z4801 Encounter for change or removal of surgical wound dressing: Secondary | ICD-10-CM | POA: Diagnosis not present

## 2020-12-02 DIAGNOSIS — Z48812 Encounter for surgical aftercare following surgery on the circulatory system: Secondary | ICD-10-CM | POA: Diagnosis not present

## 2020-12-02 DIAGNOSIS — E1151 Type 2 diabetes mellitus with diabetic peripheral angiopathy without gangrene: Secondary | ICD-10-CM | POA: Diagnosis not present

## 2020-12-02 DIAGNOSIS — I509 Heart failure, unspecified: Secondary | ICD-10-CM | POA: Diagnosis not present

## 2020-12-03 ENCOUNTER — Other Ambulatory Visit: Payer: Self-pay

## 2020-12-03 ENCOUNTER — Ambulatory Visit (INDEPENDENT_AMBULATORY_CARE_PROVIDER_SITE_OTHER): Payer: Medicare Other | Admitting: Family

## 2020-12-03 ENCOUNTER — Encounter: Payer: Self-pay | Admitting: Family

## 2020-12-03 VITALS — BP 134/75 | Temp 97.1°F | Ht 66.0 in | Wt 154.0 lb

## 2020-12-03 DIAGNOSIS — Z720 Tobacco use: Secondary | ICD-10-CM

## 2020-12-03 DIAGNOSIS — I2510 Atherosclerotic heart disease of native coronary artery without angina pectoris: Secondary | ICD-10-CM | POA: Diagnosis not present

## 2020-12-03 DIAGNOSIS — I739 Peripheral vascular disease, unspecified: Secondary | ICD-10-CM | POA: Diagnosis not present

## 2020-12-03 DIAGNOSIS — J449 Chronic obstructive pulmonary disease, unspecified: Secondary | ICD-10-CM

## 2020-12-03 DIAGNOSIS — F5101 Primary insomnia: Secondary | ICD-10-CM | POA: Diagnosis not present

## 2020-12-03 DIAGNOSIS — M8949 Other hypertrophic osteoarthropathy, multiple sites: Secondary | ICD-10-CM | POA: Diagnosis not present

## 2020-12-03 DIAGNOSIS — I10 Essential (primary) hypertension: Secondary | ICD-10-CM

## 2020-12-03 DIAGNOSIS — I5032 Chronic diastolic (congestive) heart failure: Secondary | ICD-10-CM

## 2020-12-03 DIAGNOSIS — E1142 Type 2 diabetes mellitus with diabetic polyneuropathy: Secondary | ICD-10-CM

## 2020-12-03 DIAGNOSIS — C3491 Malignant neoplasm of unspecified part of right bronchus or lung: Secondary | ICD-10-CM

## 2020-12-03 DIAGNOSIS — M159 Polyosteoarthritis, unspecified: Secondary | ICD-10-CM

## 2020-12-03 DIAGNOSIS — K5900 Constipation, unspecified: Secondary | ICD-10-CM | POA: Diagnosis not present

## 2020-12-03 DIAGNOSIS — E039 Hypothyroidism, unspecified: Secondary | ICD-10-CM | POA: Diagnosis not present

## 2020-12-03 LAB — BAYER DCA HB A1C WAIVED: HB A1C (BAYER DCA - WAIVED): 4.7 % (ref <7.0)

## 2020-12-03 NOTE — Progress Notes (Signed)
Subjective:    Patient ID: John [name], male    DOB: Aug 23, 1945, 75 y.o.   MRN: [id number]  Chief Complaint  Patient presents with   Follow-up   Pt presents to the office today for  chronic care follow up. He is followed by Cardiologists  for CHF. Followed by Oncologists every 6 months for non-small lung cancer. He completed radiation.  He is followed by Pulmonologist every 6 months for COPD. He is followed by Vascular for PAD.    He is followed by Urologists for BPH and Prostate.   He fell at home last week and hit his left shoulder and left wrist. Had a negative shoulder x-ray, but possible nodisplaced fracture at the ulnar styloid. He states his wrist pain and swelling has improved. Reports he took one dose of the Ultram and it made him "act crazy" and his O2 84%.  Hypertension This is a chronic problem. The current episode started more than 1 year ago. The problem has been resolved since onset. The problem is controlled. Associated symptoms include blurred vision, malaise/fatigue, peripheral edema and shortness of breath. Risk factors for coronary artery disease include dyslipidemia, obesity and male gender. The current treatment provides moderate improvement. Hypertensive end-organ damage includes CAD/MI and heart failure. Identifiable causes of hypertension include a thyroid problem.  Congestive Heart Failure Presents for follow-up visit. Associated symptoms include edema, fatigue, nocturia and shortness of breath. The symptoms have been stable.  Thyroid Problem Presents for follow-up visit. Symptoms include constipation and fatigue. Patient reports no diarrhea. The symptoms have been stable. His past medical history is significant for heart failure and hyperlipidemia.  Hyperlipidemia This is a chronic problem. The current episode started more than 1 year ago. Associated symptoms include shortness of breath. Current antihyperlipidemic treatment includes statins. The current  treatment provides moderate improvement of lipids. Risk factors for coronary artery disease include dyslipidemia, diabetes mellitus, male sex, hypertension and a sedentary lifestyle.  Diabetes He presents for his follow-up diabetic visit. He has type 2 diabetes mellitus. Associated symptoms include blurred vision, fatigue and foot paresthesias. Symptoms are stable. Risk factors for coronary artery disease include dyslipidemia, diabetes mellitus, male sex, hypertension and sedentary lifestyle. He is following a generally healthy diet. His overall blood glucose range is 110-130 mg/dl.  Arthritis Presents for follow-up visit. He complains of pain and stiffness. Affected locations include the left shoulder, right shoulder, right wrist and left wrist. His pain is at a severity of 7/10. Associated symptoms include fatigue. Pertinent negatives include no diarrhea.  Benign Prostatic Hypertrophy This is a chronic problem. The current episode started more than 1 year ago. Irritative symptoms include nocturia.  Nicotine Dependence Presents for follow-up visit. Symptoms include fatigue and insomnia. His urge triggers include company of smokers. He smokes < 1/2 a pack of cigarettes per day.  Insomnia Primary symptoms: malaise/fatigue.    Constipation This is a chronic problem. The current episode started more than 1 year ago. His stool frequency is 1 time per day. Pertinent negatives include no diarrhea.     Review of Systems  Constitutional:  Positive for fatigue and malaise/fatigue.  Eyes:  Positive for blurred vision.  Respiratory:  Positive for shortness of breath.   Gastrointestinal:  Positive for constipation. Negative for diarrhea.  Genitourinary:  Positive for nocturia.  Musculoskeletal:  Positive for arthritis and stiffness.  Psychiatric/Behavioral:  The patient has insomnia.   All other systems reviewed and are negative.     Objective:   Physical Exam  Vitals reviewed.  Constitutional:       General: He is not in acute distress.    Appearance: He is well-developed. He is obese.  HENT:     Head: Normocephalic.     Right Ear: Tympanic membrane normal.     Left Ear: Tympanic membrane normal.  Eyes:     General:        Right eye: No discharge.        Left eye: No discharge.     Pupils: Pupils are equal, round, and reactive to light.  Neck:     Thyroid: No thyromegaly.  Cardiovascular:     Rate and Rhythm: Normal rate and regular rhythm.     Heart sounds: Normal heart sounds. No murmur heard. Pulmonary:     Effort: Pulmonary effort is normal. No respiratory distress.     Breath sounds: Normal breath sounds. No wheezing.  Abdominal:     General: Bowel sounds are normal. There is no distension.     Palpations: Abdomen is soft.     Tenderness: There is no abdominal tenderness.  Musculoskeletal:        General: No tenderness.     Cervical back: Normal range of motion and neck supple.     Comments: Left shoulder pain and ecchymosis present on anterior shoulder    Skin:    General: Skin is warm and dry.     Coloration: Skin is pale.     Findings: No erythema or rash.  Neurological:     Mental Status: He is alert and oriented to person, place, and time.     Cranial Nerves: No cranial nerve deficit.     Motor: Weakness present.     Deep Tendon Reflexes: Reflexes are normal and symmetric.  Psychiatric:        Behavior: Behavior normal.        Thought Content: Thought content normal.        Judgment: Judgment normal.      BP 134/75   Temp (!) 97.1 F (36.2 C) (Temporal)   Ht 5' 6"  (1.676 m)   Wt 154 lb (69.9 kg)   SpO2 95%   BMI 24.86 kg/m      Assessment & Plan:  [name] comes in today with chief complaint of Follow-up   Diagnosis and orders addressed:  1. Chronic diastolic CHF (congestive heart failure) (HCC) - CMP14+EGFR - CBC with Differential/Platelet  2. PAD (peripheral artery disease) (HCC) - CMP14+EGFR - CBC with  Differential/Platelet  3. Essential hypertension - CMP14+EGFR - CBC with Differential/Platelet  4. Non-small cell lung cancer, right (HCC) - CMP14+EGFR - CBC with Differential/Platelet  5. COPD GOLD III/ group D still smoking  - CMP14+EGFR - CBC with Differential/Platelet  6. Type 2 diabetes mellitus with diabetic polyneuropathy, without long-term current use of insulin (HCC) - Bayer DCA Hb A1c Waived - CMP14+EGFR - CBC with Differential/Platelet  7. Hypothyroidism, unspecified type - CMP14+EGFR - CBC with Differential/Platelet  8. Primary osteoarthritis involving multiple joints - CMP14+EGFR - CBC with Differential/Platelet  9. Primary insomnia - CMP14+EGFR - CBC with Differential/Platelet  10. Tobacco abuse - CMP14+EGFR - CBC with Differential/Platelet  11. Constipation, unspecified constipation type - CMP14+EGFR - CBC with Differential/Platelet   Labs pending Health Maintenance reviewed Diet and exercise encouraged  Follow up plan: 3 months    Evelina Dun, FNP

## 2020-12-03 NOTE — Patient Instructions (Signed)
Shoulder Pain °Many things can cause shoulder pain, including: °An injury to the shoulder. °Overuse of the shoulder. °Arthritis. °The source of the pain can be: °Inflammation. °An injury to the shoulder joint. °An injury to a tendon, ligament, or bone. °Follow these instructions at home: °Pay attention to changes in your symptoms. Let your health care provider know about them. Follow these instructions to relieve your pain. °If you have a sling: °Wear the sling as told by your health care provider. Remove it only as told by your health care provider. °Loosen the sling if your fingers tingle, become numb, or turn cold and blue. °Keep the sling clean. °If the sling is not waterproof: °Do not let it get wet. Remove it to shower or bathe. °Move your arm as little as possible, but keep your hand moving to prevent swelling. °Managing pain, stiffness, and swelling ° °If directed, put ice on the painful area: °Put ice in a plastic bag. °Place a towel between your skin and the bag. °Leave the ice on for 20 minutes, 2-3 times per day. Stop applying ice if it does not help with the pain. °Squeeze a soft ball or a foam pad as much as possible. This helps to keep the shoulder from swelling. It also helps to strengthen the arm. °General instructions °Take over-the-counter and prescription medicines only as told by your health care provider. °Keep all follow-up visits as told by your health care provider. This is important. °Contact a health care provider if: °Your pain gets worse. °Your pain is not relieved with medicines. °New pain develops in your arm, hand, or fingers. °Get help right away if: °Your arm, hand, or fingers: °Tingle. °Become numb. °Become swollen. °Become painful. °Turn white or blue. °Summary °Shoulder pain can be caused by an injury, overuse, or arthritis. °Pay attention to changes in your symptoms. Let your health care provider know about them. °This condition may be treated with a sling, ice, and pain  medicines. °Contact your health care provider if the pain gets worse or new pain develops. Get help right away if your arm, hand, or fingers tingle or become numb, swollen, or painful. °Keep all follow-up visits as told by your health care provider. This is important. °This information is not intended to replace advice given to you by your health care provider. Make sure you discuss any questions you have with your health care provider. °Document Revised: 10/06/2017 Document Reviewed: 10/06/2017 °Elsevier Patient Education © 2022 [employer] ° °

## 2020-12-04 ENCOUNTER — Ambulatory Visit: Payer: Medicare Other

## 2020-12-04 DIAGNOSIS — E1142 Type 2 diabetes mellitus with diabetic polyneuropathy: Secondary | ICD-10-CM | POA: Diagnosis not present

## 2020-12-04 DIAGNOSIS — L84 Corns and callosities: Secondary | ICD-10-CM | POA: Diagnosis not present

## 2020-12-04 DIAGNOSIS — B351 Tinea unguium: Secondary | ICD-10-CM | POA: Diagnosis not present

## 2020-12-04 DIAGNOSIS — M79676 Pain in unspecified toe(s): Secondary | ICD-10-CM | POA: Diagnosis not present

## 2020-12-04 LAB — CMP14+EGFR
ALT: 6 IU/L (ref 0–44)
AST: 16 IU/L (ref 0–40)
Albumin/Globulin Ratio: 1.7 (ref 1.2–2.2)
Albumin: 4.4 g/dL (ref 3.7–4.7)
Alkaline Phosphatase: 110 IU/L (ref 44–121)
BUN/Creatinine Ratio: 14 (ref 10–24)
BUN: 11 mg/dL (ref 8–27)
Bilirubin Total: <0.2 mg/dL (ref 0.0–1.2)
CO2: 31 mmol/L — ABNORMAL HIGH (ref 20–29)
Calcium: 9.7 mg/dL (ref 8.6–10.2)
Chloride: 98 mmol/L (ref 96–106)
Creatinine, Ser: 0.78 mg/dL (ref 0.76–1.27)
Globulin, Total: 2.6 g/dL (ref 1.5–4.5)
Glucose: 111 mg/dL — ABNORMAL HIGH (ref 65–99)
Potassium: 3.7 mmol/L (ref 3.5–5.2)
Sodium: 141 mmol/L (ref 134–144)
Total Protein: 7 g/dL (ref 6.0–8.5)
eGFR: 94 mL/min/{1.73_m2} (ref >59)

## 2020-12-04 LAB — CBC WITH DIFFERENTIAL/PLATELET
Basophils Absolute: 0.1 10*3/uL (ref 0.0–0.2)
Basos: 1 %
EOS (ABSOLUTE): 0.2 10*3/uL (ref 0.0–0.4)
Eos: 3 %
Hematocrit: 34.3 % — ABNORMAL LOW (ref 37.5–51.0)
Hemoglobin: 11.1 g/dL — ABNORMAL LOW (ref 13.0–17.7)
Immature Grans (Abs): 0 10*3/uL (ref 0.0–0.1)
Immature Granulocytes: 0 %
Lymphocytes Absolute: 0.8 10*3/uL (ref 0.7–3.1)
Lymphs: 12 %
MCH: 32.6 pg (ref 26.6–33.0)
MCHC: 32.4 g/dL (ref 31.5–35.7)
MCV: 101 fL — ABNORMAL HIGH (ref 79–97)
Monocytes Absolute: 0.7 10*3/uL (ref 0.1–0.9)
Monocytes: 11 %
Neutrophils Absolute: 4.9 10*3/uL (ref 1.4–7.0)
Neutrophils: 73 %
Platelets: 340 10*3/uL (ref 150–450)
RBC: 3.41 x10E6/uL — ABNORMAL LOW (ref 4.14–5.80)
RDW: 14.5 % (ref 11.6–15.4)
WBC: 6.7 10*3/uL (ref 3.4–10.8)

## 2020-12-05 ENCOUNTER — Other Ambulatory Visit: Payer: Self-pay

## 2020-12-05 ENCOUNTER — Telehealth: Payer: Self-pay | Admitting: Cardiovascular Disease

## 2020-12-05 DIAGNOSIS — F419 Anxiety disorder, unspecified: Secondary | ICD-10-CM | POA: Diagnosis not present

## 2020-12-05 DIAGNOSIS — F17210 Nicotine dependence, cigarettes, uncomplicated: Secondary | ICD-10-CM | POA: Diagnosis not present

## 2020-12-05 DIAGNOSIS — R338 Other retention of urine: Secondary | ICD-10-CM | POA: Diagnosis not present

## 2020-12-05 DIAGNOSIS — H3530 Unspecified macular degeneration: Secondary | ICD-10-CM | POA: Diagnosis not present

## 2020-12-05 DIAGNOSIS — I110 Hypertensive heart disease with heart failure: Secondary | ICD-10-CM | POA: Diagnosis not present

## 2020-12-05 DIAGNOSIS — Z7901 Long term (current) use of anticoagulants: Secondary | ICD-10-CM | POA: Diagnosis not present

## 2020-12-05 DIAGNOSIS — Z4801 Encounter for change or removal of surgical wound dressing: Secondary | ICD-10-CM | POA: Diagnosis not present

## 2020-12-05 DIAGNOSIS — K219 Gastro-esophageal reflux disease without esophagitis: Secondary | ICD-10-CM | POA: Diagnosis not present

## 2020-12-05 DIAGNOSIS — E785 Hyperlipidemia, unspecified: Secondary | ICD-10-CM | POA: Diagnosis not present

## 2020-12-05 DIAGNOSIS — N401 Enlarged prostate with lower urinary tract symptoms: Secondary | ICD-10-CM | POA: Diagnosis not present

## 2020-12-05 DIAGNOSIS — J449 Chronic obstructive pulmonary disease, unspecified: Secondary | ICD-10-CM | POA: Diagnosis not present

## 2020-12-05 DIAGNOSIS — E039 Hypothyroidism, unspecified: Secondary | ICD-10-CM | POA: Diagnosis not present

## 2020-12-05 DIAGNOSIS — M1990 Unspecified osteoarthritis, unspecified site: Secondary | ICD-10-CM | POA: Diagnosis not present

## 2020-12-05 DIAGNOSIS — I2510 Atherosclerotic heart disease of native coronary artery without angina pectoris: Secondary | ICD-10-CM | POA: Diagnosis not present

## 2020-12-05 DIAGNOSIS — E1151 Type 2 diabetes mellitus with diabetic peripheral angiopathy without gangrene: Secondary | ICD-10-CM | POA: Diagnosis not present

## 2020-12-05 DIAGNOSIS — F329 Major depressive disorder, single episode, unspecified: Secondary | ICD-10-CM | POA: Diagnosis not present

## 2020-12-05 DIAGNOSIS — Z9181 History of falling: Secondary | ICD-10-CM | POA: Diagnosis not present

## 2020-12-05 DIAGNOSIS — I4892 Unspecified atrial flutter: Secondary | ICD-10-CM | POA: Diagnosis not present

## 2020-12-05 DIAGNOSIS — C3491 Malignant neoplasm of unspecified part of right bronchus or lung: Secondary | ICD-10-CM | POA: Diagnosis not present

## 2020-12-05 DIAGNOSIS — Z9981 Dependence on supplemental oxygen: Secondary | ICD-10-CM | POA: Diagnosis not present

## 2020-12-05 DIAGNOSIS — E1140 Type 2 diabetes mellitus with diabetic neuropathy, unspecified: Secondary | ICD-10-CM | POA: Diagnosis not present

## 2020-12-05 DIAGNOSIS — J9611 Chronic respiratory failure with hypoxia: Secondary | ICD-10-CM | POA: Diagnosis not present

## 2020-12-05 DIAGNOSIS — Z48812 Encounter for surgical aftercare following surgery on the circulatory system: Secondary | ICD-10-CM | POA: Diagnosis not present

## 2020-12-05 DIAGNOSIS — G4733 Obstructive sleep apnea (adult) (pediatric): Secondary | ICD-10-CM | POA: Diagnosis not present

## 2020-12-05 DIAGNOSIS — I509 Heart failure, unspecified: Secondary | ICD-10-CM | POA: Diagnosis not present

## 2020-12-05 MED ORDER — NEBIVOLOL HCL 2.5 MG PO TABS: 2.5000 mg | ORAL_TABLET | Freq: Every day | ORAL | 6 refills | Status: DC

## 2020-12-05 NOTE — Telephone Encounter (Signed)
Follow up:    Patient returning a call to the nurse.

## 2020-12-05 NOTE — Telephone Encounter (Signed)
See medication refill message  Patient is to start  Nebivolol 2.5 mg  daily  Patient voiced understanding.

## 2020-12-05 NOTE — Telephone Encounter (Signed)
Spoke to patient aware 0 - start taking Nebivolol  2.5 mg once a day .  Initial sent quantity of 30 tablet wit 6 refills . If patientis able to tolerate can request a 60 day supply. Patient is aware.

## 2020-12-05 NOTE — Telephone Encounter (Signed)
[id number] 

## 2020-12-05 NOTE — Telephone Encounter (Signed)
Patient states he received a call this morning about starting a new medication. I did not see any notes.

## 2020-12-05 NOTE — Telephone Encounter (Signed)
-----   Message from Darreld Mclean, [place] sent at 11/30/2020  7:50 AM EDT ----- Regarding: Medication Update John [name],  I saw this patient yesterday and told him I was going to talk with Pharmacy about beta-blocker options given he has COPD and Metoprolol caused him significant fatigue. Spoke with Pharmacy who recommended Nebivolol. So can you please send in a prescription for Nebivolol 2.5mg  daily and call and update patient whenever you get a chance?  Thank you so much! Callie

## 2020-12-06 ENCOUNTER — Telehealth: Payer: Self-pay | Admitting: *Deleted

## 2020-12-06 NOTE — Telephone Encounter (Signed)
NA

## 2020-12-06 NOTE — Telephone Encounter (Signed)
VM from [place] w/ [facility] Pt's HGB A1C was down at visit the other day, you stopped his Metformin Pt would like to know if he can stop checking his BS daily and check 2-3 times a week Please let Colletta [place] know

## 2020-12-06 NOTE — Telephone Encounter (Signed)
Yes this is fine. Please check if he is feeling light headed or not well.

## 2020-12-07 ENCOUNTER — Telehealth: Payer: Self-pay | Admitting: Family

## 2020-12-07 NOTE — Telephone Encounter (Signed)
Patient aware and verbalized understanding. °

## 2020-12-10 ENCOUNTER — Other Ambulatory Visit: Payer: Self-pay | Admitting: Family

## 2020-12-12 DIAGNOSIS — E1151 Type 2 diabetes mellitus with diabetic peripheral angiopathy without gangrene: Secondary | ICD-10-CM | POA: Diagnosis not present

## 2020-12-12 DIAGNOSIS — Z48812 Encounter for surgical aftercare following surgery on the circulatory system: Secondary | ICD-10-CM | POA: Diagnosis not present

## 2020-12-12 DIAGNOSIS — I4892 Unspecified atrial flutter: Secondary | ICD-10-CM | POA: Diagnosis not present

## 2020-12-12 DIAGNOSIS — Z4801 Encounter for change or removal of surgical wound dressing: Secondary | ICD-10-CM | POA: Diagnosis not present

## 2020-12-12 DIAGNOSIS — I110 Hypertensive heart disease with heart failure: Secondary | ICD-10-CM | POA: Diagnosis not present

## 2020-12-12 DIAGNOSIS — I509 Heart failure, unspecified: Secondary | ICD-10-CM | POA: Diagnosis not present

## 2020-12-17 ENCOUNTER — Ambulatory Visit (HOSPITAL_COMMUNITY)
Admission: RE | Admit: 2020-12-17 | Discharge: 2020-12-17 | Disposition: A | Discharge status: Home / Self Care | Payer: Medicare Other | Source: Ambulatory Visit | Attending: Hematology | Admitting: Hematology

## 2020-12-17 ENCOUNTER — Other Ambulatory Visit: Payer: Self-pay

## 2020-12-17 ENCOUNTER — Inpatient Hospital Stay (HOSPITAL_COMMUNITY): Payer: Medicare Other | Attending: Hematology

## 2020-12-17 DIAGNOSIS — I2510 Atherosclerotic heart disease of native coronary artery without angina pectoris: Secondary | ICD-10-CM | POA: Insufficient documentation

## 2020-12-17 DIAGNOSIS — M048 Other autoinflammatory syndromes: Secondary | ICD-10-CM | POA: Diagnosis not present

## 2020-12-17 DIAGNOSIS — I700 Atherosclerosis of aorta: Secondary | ICD-10-CM | POA: Insufficient documentation

## 2020-12-17 DIAGNOSIS — C3491 Malignant neoplasm of unspecified part of right bronchus or lung: Secondary | ICD-10-CM | POA: Diagnosis not present

## 2020-12-17 DIAGNOSIS — C3490 Malignant neoplasm of unspecified part of unspecified bronchus or lung: Secondary | ICD-10-CM | POA: Diagnosis not present

## 2020-12-17 DIAGNOSIS — J439 Emphysema, unspecified: Secondary | ICD-10-CM | POA: Diagnosis not present

## 2020-12-17 DIAGNOSIS — R911 Solitary pulmonary nodule: Secondary | ICD-10-CM | POA: Diagnosis not present

## 2020-12-17 DIAGNOSIS — R918 Other nonspecific abnormal finding of lung field: Secondary | ICD-10-CM | POA: Diagnosis not present

## 2020-12-17 LAB — CBC WITH DIFFERENTIAL/PLATELET
Abs Immature Granulocytes: 0.02 10*3/uL (ref 0.00–0.07)
Basophils Absolute: 0.1 10*3/uL (ref 0.0–0.1)
Basophils Relative: 1 %
Eosinophils Absolute: 0.2 10*3/uL (ref 0.0–0.5)
Eosinophils Relative: 3 %
HCT: 37.2 % — ABNORMAL LOW (ref 39.0–52.0)
Hemoglobin: 11.4 g/dL — ABNORMAL LOW (ref 13.0–17.0)
Immature Granulocytes: 0 %
Lymphocytes Relative: 8 %
Lymphs Abs: 0.5 10*3/uL — ABNORMAL LOW (ref 0.7–4.0)
MCH: 33.9 pg (ref 26.0–34.0)
MCHC: 30.6 g/dL (ref 30.0–36.0)
MCV: 110.7 fL — ABNORMAL HIGH (ref 80.0–100.0)
Monocytes Absolute: 0.6 10*3/uL (ref 0.1–1.0)
Monocytes Relative: 9 %
Neutro Abs: 5.7 10*3/uL (ref 1.7–7.7)
Neutrophils Relative %: 79 %
Platelets: 351 10*3/uL (ref 150–400)
RBC: 3.36 MIL/uL — ABNORMAL LOW (ref 4.22–5.81)
RDW: 15.5 % (ref 11.5–15.5)
WBC: 7.1 10*3/uL (ref 4.0–10.5)
nRBC: 0 % (ref 0.0–0.2)

## 2020-12-17 LAB — COMPREHENSIVE METABOLIC PANEL
ALT: 9 U/L (ref 0–44)
AST: 19 U/L (ref 15–41)
Albumin: 3.7 g/dL (ref 3.5–5.0)
Alkaline Phosphatase: 98 U/L (ref 38–126)
Anion gap: 11 (ref 5–15)
BUN: 12 mg/dL (ref 8–23)
CO2: 29 mmol/L (ref 22–32)
Calcium: 9 mg/dL (ref 8.9–10.3)
Chloride: 96 mmol/L — ABNORMAL LOW (ref 98–111)
Creatinine, Ser: 0.95 mg/dL (ref 0.61–1.24)
GFR, Estimated: >60 mL/min (ref >60)
Glucose, Bld: 222 mg/dL — ABNORMAL HIGH (ref 70–99)
Potassium: 3.8 mmol/L (ref 3.5–5.1)
Sodium: 136 mmol/L (ref 135–145)
Total Bilirubin: 0.3 mg/dL (ref 0.3–1.2)
Total Protein: 7 g/dL (ref 6.5–8.1)

## 2020-12-17 LAB — TSH: TSH: 5.033 u[IU]/mL — ABNORMAL HIGH (ref 0.350–4.500)

## 2020-12-17 MED ORDER — IOHEXOL 350 MG/ML SOLN
60.0000 mL | Freq: Once | INTRAVENOUS | Status: AC | PRN
  Administered 2020-12-17: 60 mL via INTRAVENOUS

## 2020-12-19 DIAGNOSIS — Z48812 Encounter for surgical aftercare following surgery on the circulatory system: Secondary | ICD-10-CM | POA: Diagnosis not present

## 2020-12-19 DIAGNOSIS — I110 Hypertensive heart disease with heart failure: Secondary | ICD-10-CM | POA: Diagnosis not present

## 2020-12-19 DIAGNOSIS — Z4801 Encounter for change or removal of surgical wound dressing: Secondary | ICD-10-CM | POA: Diagnosis not present

## 2020-12-19 DIAGNOSIS — E1151 Type 2 diabetes mellitus with diabetic peripheral angiopathy without gangrene: Secondary | ICD-10-CM | POA: Diagnosis not present

## 2020-12-19 DIAGNOSIS — I509 Heart failure, unspecified: Secondary | ICD-10-CM | POA: Diagnosis not present

## 2020-12-19 DIAGNOSIS — I4892 Unspecified atrial flutter: Secondary | ICD-10-CM | POA: Diagnosis not present

## 2020-12-23 NOTE — Progress Notes (Signed)
[place] [place], [place] [address]   CLINIC:  Medical Oncology/Hematology  PCP:  [name], [place] / [place] [address] [phone]   REASON FOR VISIT:  Follow-up for right NSCLC  PRIOR THERAPY:  1. Chemoradiation with weekly carboplatin and paclitaxel from 06/25/2016 to 08/20/2016. 2. Consolidation durvalumab from 09/26/2016 to 09/25/2017.  NGS Results: not done  CURRENT THERAPY: surveillance  BRIEF ONCOLOGIC HISTORY:  Oncology History  Non-small cell lung cancer, right ([place])  04/17/2016 Imaging   CT chest- 1. Examination is positive for bilateral upper lobe pulmonary lesions suspicious for malignancy. Further investigation with PET-CT and tissue sampling is advised. 2. Borderline enlarged paratracheal lymph nodes and right hilar nodes. Cannot rule out metastatic adenopathy. Attention to these areas on PET-CT is advised. 3. Emphysema 4. Aortic atherosclerosis and multi vessel coronary artery calcification.   04/18/2016 Initial Diagnosis   Adenocarcinoma of lung, right ([place])   04/24/2016 PET scan   1. Adjacent hypermetabolic nodules in the RIGHT upper lobe consistent bronchogenic carcinoma. 2. Suspicion of ipsilateral hilar and paratracheal nodal metastasis. 3. Nodule in the LEFT upper lobe with suspicious morphology has a relatively low metabolic activity for size. Favor synchronous primary bronchogenic carcinoma. 4. No metabolic activity associated with a small LEFT lower lobe pulmonary nodule. Recommend attention on follow-up.   05/06/2016 Imaging   MRI brain- Negative for metastatic disease.   05/28/2016 Procedure   1. Electromagnetic navigational bronchoscopy with brushings, needle     aspirations and biopsies of bilateral upper lobe nodules. 2. Endobronchial ultrasound with mediastinal and hilar lymph node     aspirations. By Dr. [name]   06/03/2016 Pathology Results   Diagnosis 1. Lung,  biopsy, Right upper lobe - BLOOD AND BENIGN BRONCHIAL AND LUNG TISSUE. 2. Lung, biopsy, Left upper lobe - BLOOD AND BENIGN BRONCHIAL AND LUNG TISSUE.   06/03/2016 Pathology Results   Diagnosis TRANSBRONCHIAL NEEDLE ASPIRATION, NAVIGATION, RUL (SPECIMEN 5 OF 7, COLLECTED 05/28/16): MALIGNANT CELLS CONSISTENT WITH NON-SMALL CELL CARCINOMA Comment There are limited tumor cells in the cell block (insufficient for molecular testing). TTF-1 is positive in a few of the atypical cells. They appear negative for synaptophysin and cytokeratin 5/6. Overall there is limited tumor, but a lung adenocarcinoma is slightly favored. Dr. [name] has reviewed the case. The case was called to Dr. [name] on 06/03/2016.   06/13/2016 Procedure   Port placed by Dr. Arnoldo Morale   06/25/2016 - 08/15/2016 Chemotherapy   The patient had palonosetron (ALOXI) injection 0.25 mg, 0.25 mg, Intravenous,  Once, 6 of 6 cycles  CARBOplatin (PARAPLATIN) 220 mg in sodium chloride 0.9 % 250 mL chemo infusion, 220 mg (100 % of original dose 215.8 mg), Intravenous,  Once, 6 of 6 cycles Dose modification:   (original dose 215.8 mg, Cycle 1),   (original dose 215.8 mg, Cycle 5),   (original dose 215.8 mg, Cycle 6),   (original dose 215.8 mg, Cycle 2),   (original dose 215.8 mg, Cycle 3),   (original dose 215.8 mg, Cycle 4)  PACLitaxel (TAXOL) 90 mg in dextrose 5 % 250 mL chemo infusion ( for chemotherapy treatment.     06/25/2016 - 08/20/2016 Radiation Therapy   Radiation in [place], [place] by Dr. Quitman Livings.  R lung to 6600 cGy   07/09/2016 - 07/10/2016 Hospital Admission   Admit date: 07/09/2016  Admission diagnosis: Fever and chills Additional comments: Negative work-up, discharged with course of Levaquin   07/09/2016 Treatment Plan Change  Treatment deferred x 1 week due to hospitalization    08/05/2016 - 08/06/2016 Hospital Admission   Admit date: 08/05/2016 Admission diagnosis: Joint pain Additional comments: Treated with steroids with  symptomatic improvement   08/25/2016 Imaging   MRI brain- Negative for intracranial metastasis on this motion degraded examination.  Stable mild-to-moderate chronic small vessel ischemic disease.   09/18/2016 PET scan   1. Partial metabolic response. Right upper lobe 2.6 cm hypermetabolic pulmonary nodule, decreased in size and metabolism. Mildly hypermetabolic right hilar nodal metastasis, decreased in metabolism. Right paratracheal lymph node is no longer hypermetabolic. No new or progressive metastatic disease. 2. Apical left upper lobe 0.9 cm solid pulmonary nodule, slightly decreased in size, with no significant metabolism. The change in size could indicate a neoplastic etiology for this nodule. 3. Additional subcentimeter pulmonary nodules in the left lung are stable and below PET resolution . 4. Additional findings include aortic atherosclerosis, coronary atherosclerosis, moderate emphysema, stable small pericardial effusion/thickening and mild sigmoid diverticulosis.   01/13/2017 Imaging   CT chest with contrast IMPRESSION: 1. No change in size of the hypermetabolic nodular thickening in the RIGHT upper lobe. 2. LEFT upper lobe spiculated nodule is stable. 3. Rounded LEFT lower lobe pulmonary nodule is stable. 4. No evidence of lung cancer progression.   05/08/2017 Adverse Reaction   Development of Hypothyroidism- likely secondary to immunotherapy.  Levothyroxine therapy initiated.      CANCER STAGING: Cancer Staging Non-small cell lung cancer, right [facility]) Staging form: Lung, AJCC 8th Edition - Clinical stage from 06/05/2016: Stage IIIA (cT1b(2), cN2, cM0) - Signed by Baird Cancer, PA-C on 08/21/2016   INTERVAL HISTORY:  John [name], a 75 y.o. male, returns for routine follow-up of his right NSCLC. John [name] was last seen on 08/15/2020.   Today he reports feeling good. He denies any new cough or CP. He is currently smoking 1/2 ppd. He denies hematochezia and  hematuria. He reports black stools, and he has been taking iron tablets for 2-3 months. He reports improved energy levels.   REVIEW OF SYSTEMS:  Review of Systems  Constitutional:  Negative for appetite change and fatigue (75%).  Respiratory:  Positive for cough and shortness of breath.   Cardiovascular:  Negative for chest pain.  Gastrointestinal:  Positive for nausea. Negative for blood in stool.  Genitourinary:  Negative for hematuria.   Neurological:  Positive for numbness (tingling hands and feet).  All other systems reviewed and are negative.  PAST MEDICAL/SURGICAL HISTORY:  Past Medical History:  Diagnosis Date   Adenocarcinoma of lung, right ([place]) 04/18/2016   Anxiety    Arthritis    Asthma    BPH (benign prostatic hyperplasia)    with urinary retention 02/06/20   CHF (congestive heart failure) (H[facility])    COPD (chronic obstructive pulmonary disease) (H[facility])    Depression    Diabetes mellitus without complication ([place])    no meds   Diabetes mellitus, type II ([place])    DM (diabetes mellitus) ([place]) 07/09/2016   Dyspnea    History of kidney stones    History of radiation therapy 06/25/16-08/20/16   right lung   History of radiation therapy 09/17/2020   right lung 09/10/2020-09/17/2020   Dr Sondra Come   Hyperlipidemia    Hypertension    Hypertension    Hypothyroidism    Macular degeneration    Neuropathy    Non-small cell lung cancer, right ([place]) 04/18/2016   Peripheral vascular disease ([place])    Prostatitis  Pulmonary nodule, left 07/16/2016   Sleep apnea    cpap   Past Surgical History:  Procedure Laterality Date   ABDOMINAL AORTOGRAM W/LOWER EXTREMITY Left 02/06/2020   Procedure: ABDOMINAL AORTOGRAM W/LOWER EXTREMITY;  Surgeon: Lorretta Harp, MD;  Location: [facility];  Service: Cardiovascular;  Laterality: Left;   CARDIOVERSION N/A 10/05/2020   Procedure: CARDIOVERSION;  Surgeon: Geralynn Rile, MD;  Location: [facility];  Service: Cardiovascular;   Laterality: N/A;   CATARACT EXTRACTION, BILATERAL Bilateral    ENDARTERECTOMY FEMORAL Right 08/20/2020   Procedure: ENDARTERECTOMY  RIGHT FEMORAL ARTERY;  Surgeon: Marty Heck, MD;  Location: [facility];  Service: Vascular;  Laterality: Right;   ESOPHAGOGASTRODUODENOSCOPY (EGD) WITH PROPOFOL N/A 09/06/2020   Procedure: ESOPHAGOGASTRODUODENOSCOPY (EGD) WITH PROPOFOL;  Surgeon: Irene Shipper, MD;  Location: [name] ENDOSCOPY;  Service: Endoscopy;  Laterality: N/A;   INSERTION OF ILIAC STENT Right 08/20/2020   Procedure: RETROGRADE INSERTION OF RIGHT ILIAC STENT;  Surgeon: Marty Heck, MD;  Location: [facility];  Service: Vascular;  Laterality: Right;   INTRAOPERATIVE ARTERIOGRAM Right 08/20/2020   Procedure: INTRA OPERATIVE ARTERIOGRAM ILIAC;  Surgeon: Marty Heck, MD;  Location: [facility];  Service: Vascular;  Laterality: Right;   PATCH ANGIOPLASTY Right 08/20/2020   Procedure: PATCH ANGIOPLASTY RIGHT FEMORAL ARTERY;  Surgeon: Marty Heck, MD;  Location: [facility];  Service: Vascular;  Laterality: Right;   PORTACATH PLACEMENT Left 06/13/2016   Procedure: INSERTION PORT-A-CATH;  Surgeon: Aviva Signs, MD;  Location: [facility];  Service: General;  Laterality: Left;   TRANSURETHRAL RESECTION OF PROSTATE N/A 05/31/2020   Procedure: TRANSURETHRAL RESECTION OF THE PROSTATE (TURP);  Surgeon: Cleon Gustin, MD;  Location: [facility];  Service: Urology;  Laterality: N/A;   VIDEO BRONCHOSCOPY WITH ENDOBRONCHIAL NAVIGATION N/A 05/28/2016   Procedure: VIDEO BRONCHOSCOPY WITH ENDOBRONCHIAL NAVIGATION;  Surgeon: [name], MD;  Location: [facility];  Service: Thoracic;  Laterality: N/A;   VIDEO BRONCHOSCOPY WITH ENDOBRONCHIAL ULTRASOUND N/A 05/28/2016   Procedure: VIDEO BRONCHOSCOPY WITH ENDOBRONCHIAL ULTRASOUND;  Surgeon: [name], MD;  Location: [facility];  Service: Thoracic;  Laterality: N/A;    SOCIAL HISTORY:  Social History   Socioeconomic History   Marital status: Soil scientist     Spouse name: Not on file   Number of children: 5   Years of education: 10   Highest education level: 10th grade  Occupational History   Occupation: Retired  Tobacco Use   Smoking status: Every Day    Packs/day: 0.50    Years: 55.00    Pack years: 27.50    Types: Cigarettes    Start date: 03/11/1961   Smokeless tobacco: Never   Tobacco comments:    1/2 pack to 1 pack per day 12/14/19  Vaping Use   Vaping Use: Never used  Substance and Sexual Activity   Alcohol use: No   Drug use: No   Sexual activity: Yes    Birth control/protection: None  Other Topics Concern   Not on file  Social History Narrative   [name] is his POA and lives with him. Children out of town - one in [facility], others in [place].   Social Determinants of Health   Financial Resource Strain: Low Risk    Difficulty of Paying Living Expenses: Not hard at all  Food Insecurity: No Food Insecurity   Worried About Charity fundraiser in the Last Year: Never true   [place] in the Last Year:  Never true  Transportation Needs: No Transportation Needs   Lack of Transportation (Medical): No   Lack of Transportation (Non-Medical): No  Physical Activity: Unknown   Days of Exercise per Week: 7 days   Minutes of Exercise per Session: Not on file  Stress: No Stress Concern Present   Feeling of Stress : Not at all  Social Connections: Moderately Isolated   Frequency of Communication with Friends and Family: More than three times a week   Frequency of Social Gatherings with Friends and Family: Once a week   Attends Religious Services: Never   Marine scientist or Organizations: No   Attends Music therapist: Never   Marital Status: Living with partner  Human resources officer Violence: Not At Risk   Fear of Current or Ex-Partner: No   Emotionally Abused: No   Physically Abused: No   Sexually Abused: No    FAMILY HISTORY:  Family History  Problem Relation Age of Onset   Hypertension Mother     Diabetes Father    Heart disease Father    Stroke Father    Hypertension Sister     CURRENT MEDICATIONS:  Current Outpatient Medications  Medication Sig Dispense Refill   apixaban (ELIQUIS) 5 MG TABS tablet Take 1 tablet (5 mg total) by mouth 2 (two) times daily. 60 tablet 3   Blood Glucose Monitoring Suppl (ONETOUCH VERIO REFLECT) w/Device KIT Use to test blood sugars daily as directed. DX: E11.9 1 kit 1   clopidogrel (PLAVIX) 75 MG tablet Take 1 tablet (75 mg total) by mouth daily. 90 tablet 1   Fluticasone-Umeclidin-Vilant (TRELEGY ELLIPTA) 100-62.5-25 MCG/INH AEPB Inhale 1 puff into the lungs daily. 1 each 11   gabapentin (NEURONTIN) 300 MG capsule TAKE 1 CAPSULE BY MOUTH THREE TIMES A DAY 270 capsule 0   glucose blood (ONETOUCH VERIO) test strip Use to test blood sugars daily as directed. DX: E11.9 100 each 12   hydrOXYzine (VISTARIL) 25 MG capsule Take 1 capsule (25 mg total) by mouth 3 (three) times daily as needed for itching. 60 capsule 1   Iron, Ferrous Sulfate, 325 (65 Fe) MG TABS Take 325 mg by mouth 2 (two) times daily. 180 tablet 1   KLOR-CON M20 20 MEQ tablet TAKE 1 TABLET BY MOUTH EVERY DAY 90 tablet 0   levocetirizine (XYZAL) 5 MG tablet Take 1 tablet (5 mg total) by mouth at bedtime. 30 tablet 2   linaclotide (LINZESS) 145 MCG CAPS capsule Take 1 capsule (145 mcg total) by mouth daily. To regulate bowel movements 30 capsule 5   metFORMIN (GLUCOPHAGE-XR) 500 MG 24 hr tablet TAKE 1 TABLET BY MOUTH EVERY DAY WITH BREAKFAST 90 tablet 0   nebivolol (BYSTOLIC) 2.5 MG tablet Take 1 tablet (2.5 mg total) by mouth daily. 30 tablet 6   nystatin (MYCOSTATIN) 100000 UNIT/ML suspension Take 5 mLs (500,000 Units total) by mouth 4 (four) times daily. 60 mL 0   OneTouch Delica Lancets 53Z MISC Use to test blood sugars daily as directed. DX: E11.9 100 each 1   pantoprazole (PROTONIX) 40 MG tablet Take 1 tablet (40 mg total) by mouth daily. 90 tablet 1   pravastatin (PRAVACHOL) 40 MG  tablet TAKE 1 TABLET (40 MG TOTAL) BY MOUTH ON MONDAYS , WEDNESDAYS, AND FRIDAYS AS DIRECTED 48 tablet 3   Semaglutide (RYBELSUS) 7 MG TABS Take 7 mg by mouth daily. 90 tablet 1   senna-docusate (SENOKOT-S) 8.6-50 MG tablet Take 1 tablet by mouth 2 (two) times  daily between meals as needed for mild constipation. 180 tablet 0   triamcinolone cream (KENALOG) 0.1 % Apply 1 application topically 2 (two) times daily. 80 g 1   zolpidem (AMBIEN) 5 MG tablet Take 1 tablet (5 mg total) by mouth at bedtime as needed for sleep. 30 tablet 2   acetaminophen (TYLENOL) 650 MG CR tablet Take 650 mg by mouth every 8 (eight) hours as needed for pain. (Patient not taking: Reported on 12/24/2020)     albuterol (VENTOLIN HFA) 108 (90 Base) MCG/ACT inhaler Inhale 2 puffs into the lungs every 4 (four) hours as needed for wheezing or shortness of breath. (Patient not taking: Reported on 12/24/2020) 8 g 6   fluticasone (FLONASE) 50 MCG/ACT nasal spray Place 1 spray into both nostrils daily as needed for allergies or rhinitis. (Patient not taking: Reported on 12/24/2020) 18.2 mL 2   levothyroxine (SYNTHROID) 50 MCG tablet Take 1 tablet (50 mcg total) by mouth daily. 90 tablet 1   mupirocin ointment (BACTROBAN) 2 % Apply 1 application topically 2 (two) times daily as needed (wound care). (Patient not taking: Reported on 12/24/2020)     ondansetron (ZOFRAN-ODT) 4 MG disintegrating tablet Take 1-2 tablets (4-8 mg total) by mouth every 8 (eight) hours as needed for nausea or vomiting. (Patient not taking: Reported on 12/24/2020) 60 tablet 1   Torsemide 40 MG TABS Take 40 mg by mouth daily. Can take an additional 37m in the afternoon as needed for weight gain (3lbs in 1 day or 5lbs in 1 week) or worsening lower extremity edema or shortness of breath. (Patient not taking: Reported on 12/24/2020) 180 tablet 3   No current facility-administered medications for this visit.    ALLERGIES:  Allergies  Allergen Reactions   Sulfa Antibiotics  Swelling    Mouth swelling   Atorvastatin Other (See Comments)    Muscle aches - tolerating Pravastatin 40 mg MWF   Jardiance [Empagliflozin] Other (See Comments)    FEELS SLUGGISH, TIRED   Lopressor [Metoprolol] Other (See Comments)    Fatigue   Rosuvastatin Other (See Comments)    Muscle aches - tolerating Pravastatin 40 mg MWF   Temazepam Other (See Comments)    Made insomnia worse     PHYSICAL EXAM:  Performance status (ECOG): 1 - Symptomatic but completely ambulatory  Vitals:   12/24/20 1118  BP: (!) 124/48  Pulse: 91  Resp: 16  Temp: 97.6 F (36.4 C)  SpO2: 100%   Wt Readings from Last 3 Encounters:  12/24/20 157 lb 9.6 oz (71.5 kg)  12/03/20 154 lb (69.9 kg)  11/30/20 155 lb (70.3 kg)   Physical Exam Vitals reviewed.  Constitutional:      Appearance: Normal appearance.  Cardiovascular:     Rate and Rhythm: Normal rate and regular rhythm.     Pulses: Normal pulses.     Heart sounds: Normal heart sounds.  Pulmonary:     Effort: Pulmonary effort is normal.     Breath sounds: Normal breath sounds.  Neurological:     General: No focal deficit present.     Mental Status: He is alert and oriented to person, place, and time.  Psychiatric:        Mood and Affect: Mood normal.        Behavior: Behavior normal.     LABORATORY DATA:  I have reviewed the labs as listed.  CBC Latest Ref Rng & Units 12/17/2020 12/03/2020 11/01/2020  WBC 4.0 - 10.5 K/uL 7.1 6.7 7.4  Hemoglobin 13.0 - 17.0 g/dL 11.4(L) 11.1(L) 7.8(LL)  Hematocrit 39.0 - 52.0 % 37.2(L) 34.3(L) 26.2(L)  Platelets 150 - 400 K/uL 351 340 416   CMP Latest Ref Rng & Units 12/17/2020 12/03/2020 10/26/2020  Glucose 70 - 99 mg/dL 222(H) 111(H) 152(H)  BUN 8 - 23 mg/dL 12 11 11   Creatinine 0.61 - 1.24 mg/dL 0.95 0.78 0.77  Sodium 135 - 145 mmol/L 136 141 143  Potassium 3.5 - 5.1 mmol/L 3.8 3.7 3.3(L)  Chloride 98 - 111 mmol/L 96(L) 98 98  CO2 22 - 32 mmol/L 29 31(H) 28  Calcium 8.9 - 10.3 mg/dL 9.0 9.7 8.9   Total Protein 6.5 - 8.1 g/dL 7.0 7.0 5.7(L)  Total Bilirubin 0.3 - 1.2 mg/dL 0.3 <0.2 <0.2  Alkaline Phos 38 - 126 U/L 98 110 109  AST 15 - 41 U/L 19 16 14   ALT 0 - 44 U/L 9 6 6     DIAGNOSTIC IMAGING:  I have independently reviewed the scans and discussed with the patient. DG Wrist Complete Left  Result Date: 11/30/2020 CLINICAL DATA:  Fall wrist pain and swelling EXAM: LEFT WRIST - COMPLETE 3+ VIEW COMPARISON:  None. FINDINGS: Possible nondisplaced fracture deformity at the ulnar styloid. Positive for soft tissue swelling. No malalignment. Degenerative changes at the first [facility] joint and STT interval. IMPRESSION: Possible nondisplaced fracture at the ulnar styloid Electronically Signed   By: Donavan Foil M.D.   On: 11/30/2020 20:00   CT HEAD WO CONTRAST (5MM)  Result Date: 11/30/2020 CLINICAL DATA:  Head injury after multiple falls. EXAM: CT HEAD WITHOUT CONTRAST TECHNIQUE: Contiguous axial images were obtained from the base of the skull through the vertex without intravenous contrast. COMPARISON:  None. FINDINGS: Brain: Mild chronic ischemic white matter disease is noted. No mass effect or midline shift is noted. Ventricular size is within normal limits. There is no evidence of mass lesion, hemorrhage or acute infarction. Vascular: No hyperdense vessel or unexpected calcification. Skull: Normal. Negative for fracture or focal lesion. Sinuses/Orbits: No acute finding. Other: None. IMPRESSION: No acute intracranial abnormality seen. Electronically Signed   By: Marijo Conception M.D.   On: 11/30/2020 11:48   CT Chest W Contrast  Result Date: 12/18/2020 CLINICAL DATA:  Stage IIIA non-small cell right upper lobe lung cancer diagnosed 2018 status post chemoradiation therapy. Status post radiation therapy to bilateral enlarging upper lobe pulmonary nodules completed 09/17/2020. Restaging. EXAM: CT CHEST WITH CONTRAST TECHNIQUE: Multidetector CT imaging of the chest was performed during intravenous  contrast administration. CONTRAST:  85m OMNIPAQUE IOHEXOL 350 MG/ML SOLN COMPARISON:  03/23/2020 chest CT. FINDINGS: Cardiovascular: Normal heart size. No significant pericardial effusion/thickening. Three-vessel coronary atherosclerosis. Left subclavian Port-A-Cath terminates in the lower third of the SVC. Atherosclerotic nonaneurysmal thoracic aorta. Normal caliber pulmonary arteries. No central pulmonary emboli. Mediastinum/Nodes: No discrete thyroid nodules. Unremarkable esophagus. No pathologically enlarged axillary, mediastinal or hilar lymph nodes. Lungs/Pleura: No pneumothorax. No pleural effusion. Moderate centrilobular emphysema with mild diffuse bronchial wall thickening. Stable sharply marginated patchy right perihilar lung consolidation with associated mild bronchiectasis, volume loss and distortion, compatible with postradiation change. Peripheral right upper lobe 0.8 x 0.7 cm solid pulmonary nodule (series 4/image 43), decreased from 1.1 x 1.0 cm on 08/13/2020 PET-CT. Posterior left upper lobe 0.8 cm solid pulmonary nodule (series 4/image 34), decreased from 1.0 cm on 08/13/2020. Indistinct 0.9 cm apical left upper lobe (series 4/image 24) and 0.6 cm posterior apical left upper lobe (series 4/image 27) pulmonary nodules are stable since 08/13/2020. Solid  peripheral left lower lobe 0.6 cm nodule (series 4/image 80), stable from 08/13/2020. No new significant pulmonary nodules. Upper abdomen: No acute abnormality. Musculoskeletal: No aggressive appearing focal osseous lesions. Marked thoracic spondylosis. IMPRESSION: 1. Bilateral pulmonary nodules are stable to decreased since 08/13/2020 PET-CT as detailed. No new or progressive metastatic disease in the chest. 2. Stable postradiation change in the right perihilar lung. 3. Three-vessel coronary atherosclerosis. 4. Aortic Atherosclerosis (ICD10-I70.0) and Emphysema (ICD10-J43.9). Electronically Signed   By: Ilona Sorrel M.D.   On: 12/18/2020 08:00    DG Shoulder Left  Result Date: 11/30/2020 CLINICAL DATA:  Fall and trauma to the left shoulder. EXAM: LEFT SHOULDER - 2+ VIEW COMPARISON:  Chest radiograph dated 10/09/2020. FINDINGS: There is no acute fracture or dislocation. The bones are osteopenic. Mild arthritic changes of the left shoulder with narrowing of the inferior glenohumeral joint space and spurring. Partially visualized left-sided Port-A-Cath. The soft tissues are unremarkable. IMPRESSION: No acute fracture or dislocation. Electronically Signed   By: Anner Crete M.D.   On: 11/30/2020 19:55     ASSESSMENT:  1.  Stage IIIa (T1b N2 cM0) NSCLC, favoring adenocarcinoma: -Chemo XRT with weekly carboplatin and paclitaxel completed on 08/20/2016. -Consolidation durvalumab from 09/26/2016 through 09/25/2017. -Reviewed CT chest on 11/07/2019 which showed increasing size of the upper lobe pulmonary nodules with no new nodule in the left upper lobe suspicious for meta stasis.  Also increasing confluent soft tissue in the right suprahilar region, thought to be postradiation changes. -PET scan on 11/21/2019 showed suspected radiation changes in the right paramediastinal/perihilar region although mild hypermetabolism is present in the right retroareolar region.SUV 2.5.  While progressive soft tissue is present in this region, this hypermetabolism remains nonfocal and favors radiation changes.  7 mm central left upper lobe nodule max SUV 1.4.  9 mm aggregate nodule in the lateral right upper lobe max SUV 2.4.  These are minimally progressive from more remote prior scans.  6 mm left lower lobe nodule unchanged.  No hypermetabolic thoracic adenopathy. -CT chest on 03/23/2020 showed many of the lung nodules are stable.  Couple of nodules have grown by few millimeters.  No new areas seen. - SBRT to the right and left lung lesions, 54 Gray in 3 fractions from 09/10/2020 through 09/17/2020.   PLAN:  1.  Stage IIIa (T1b N2 cM0) NSCLC, favoring  adenocarcinoma: -SBRT to the right and left lung lesions, 54 Gray in 3 fractions from 09/10/2020 through 09/17/2020. - He is continuing to smoke about half pack per day of cigarettes daily. - We have reviewed CT scan of the chest with contrast from 12/17/2020 which showed bilateral pulmonary nodules are stable to decreased since 08/13/2020 PET scan.  No new or progressive metastatic disease.  Stable postradiation change in the right perihilar lung. - Reviewed labs which showed normal LFTs and creatinine. - We will repeat another CT scan in 6 months.   2.  Hypothyroidism: - TSH today is 5.03.  Will not make any changes.  Continue current dose of Synthroid.   3.  Difficulty staying asleep: - Continue temazepam as needed.  4.  Normocytic to macrocytic anemia: - Found to have hemoglobin 6.3 on 10/09/2020 and received 2 units PRBC while hospitalized.  I have reviewed hospitalization records. - EGD on 09/06/2020 with tiny gastric ulcer without bleeding or stigmata. - Blood loss this year most likely postoperative in nature (urological surgery in February and vascular surgery in May). - CBC on 12/17/2020 with hemoglobin 11.4 and MCV of  110. - He is taking iron tablet twice daily.  Denies any bleeding per rectum but has dark stools. - Will check CBC, ferritin, iron panel, B12, methylmalonic acid, folic acid, copper, TSH and SPEP in 3 months.   Orders placed this encounter:  No orders of the defined types were placed in this encounter.    Derek Jack, MD [place] [phone]   I, Thana Ates, am acting as a scribe for Dr. Derek Jack.  I, Derek Jack MD, have reviewed the above documentation for accuracy and completeness, and I agree with the above.

## 2020-12-24 ENCOUNTER — Other Ambulatory Visit: Payer: Self-pay

## 2020-12-24 ENCOUNTER — Inpatient Hospital Stay (HOSPITAL_COMMUNITY): Payer: Medicare Other

## 2020-12-24 ENCOUNTER — Inpatient Hospital Stay (HOSPITAL_BASED_OUTPATIENT_CLINIC_OR_DEPARTMENT_OTHER): Payer: Medicare Other | Admitting: Hematology

## 2020-12-24 VITALS — BP 124/48 | HR 91 | Temp 97.6°F | Resp 16 | Wt 157.6 lb

## 2020-12-24 DIAGNOSIS — Z7984 Long term (current) use of oral hypoglycemic drugs: Secondary | ICD-10-CM | POA: Diagnosis not present

## 2020-12-24 DIAGNOSIS — E039 Hypothyroidism, unspecified: Secondary | ICD-10-CM | POA: Insufficient documentation

## 2020-12-24 DIAGNOSIS — E119 Type 2 diabetes mellitus without complications: Secondary | ICD-10-CM | POA: Insufficient documentation

## 2020-12-24 DIAGNOSIS — Z79899 Other long term (current) drug therapy: Secondary | ICD-10-CM | POA: Diagnosis not present

## 2020-12-24 DIAGNOSIS — E538 Deficiency of other specified B group vitamins: Secondary | ICD-10-CM

## 2020-12-24 DIAGNOSIS — Z9221 Personal history of antineoplastic chemotherapy: Secondary | ICD-10-CM | POA: Diagnosis not present

## 2020-12-24 DIAGNOSIS — M048 Other autoinflammatory syndromes: Secondary | ICD-10-CM

## 2020-12-24 DIAGNOSIS — Z452 Encounter for adjustment and management of vascular access device: Secondary | ICD-10-CM | POA: Insufficient documentation

## 2020-12-24 DIAGNOSIS — Z7901 Long term (current) use of anticoagulants: Secondary | ICD-10-CM | POA: Diagnosis not present

## 2020-12-24 DIAGNOSIS — I509 Heart failure, unspecified: Secondary | ICD-10-CM | POA: Insufficient documentation

## 2020-12-24 DIAGNOSIS — C3411 Malignant neoplasm of upper lobe, right bronchus or lung: Secondary | ICD-10-CM | POA: Diagnosis not present

## 2020-12-24 DIAGNOSIS — D649 Anemia, unspecified: Secondary | ICD-10-CM | POA: Diagnosis not present

## 2020-12-24 DIAGNOSIS — C3491 Malignant neoplasm of unspecified part of right bronchus or lung: Secondary | ICD-10-CM

## 2020-12-24 DIAGNOSIS — I110 Hypertensive heart disease with heart failure: Secondary | ICD-10-CM | POA: Diagnosis not present

## 2020-12-24 DIAGNOSIS — Z923 Personal history of irradiation: Secondary | ICD-10-CM | POA: Insufficient documentation

## 2020-12-24 DIAGNOSIS — C771 Secondary and unspecified malignant neoplasm of intrathoracic lymph nodes: Secondary | ICD-10-CM | POA: Insufficient documentation

## 2020-12-24 DIAGNOSIS — D529 Folate deficiency anemia, unspecified: Secondary | ICD-10-CM

## 2020-12-24 DIAGNOSIS — D500 Iron deficiency anemia secondary to blood loss (chronic): Secondary | ICD-10-CM

## 2020-12-24 DIAGNOSIS — F17210 Nicotine dependence, cigarettes, uncomplicated: Secondary | ICD-10-CM | POA: Insufficient documentation

## 2020-12-24 MED ORDER — SODIUM CHLORIDE 0.9% FLUSH
10.0000 mL | Freq: Once | INTRAVENOUS | Status: AC
  Administered 2020-12-24: 10 mL via INTRAVENOUS

## 2020-12-24 MED ORDER — HEPARIN SOD (PORK) LOCK FLUSH 100 UNIT/ML IV SOLN
500.0000 [IU] | Freq: Once | INTRAVENOUS | Status: AC
  Administered 2020-12-24: 500 [IU] via INTRAVENOUS

## 2020-12-24 NOTE — Progress Notes (Signed)
Frederica Kuster presented for Portacath access and flush.  Portacath located left chest wall accessed with  H 20 needle.  Good blood return present. Portacath flushed with 3ml NS and 500U/28ml Heparin and needle removed intact.  Procedure tolerated well and without incident.     Port flush given today per MD orders. Tolerated infusion without adverse affects. Vital signs stable. No complaints at this time. Discharged from clinic ambulatory in stable condition. Alert and oriented x 3. F/U with [facility] as scheduled.

## 2020-12-24 NOTE — Patient Instructions (Addendum)
[place] at [facility] Discharge Instructions  You were seen today by Dr. Delton Coombes. He went over your recent results. You will be scheduled for a CT scan of your chest in 6 months. Dr. Delton Coombes will see you back in 3 months for labs and follow up.   Thank you for choosing [place] at [facility] to provide your oncology and hematology care.  To afford each patient quality time with our provider, please arrive at least 15 minutes before your scheduled appointment time.   If you have a lab appointment with the [facility] please come in thru the Main Entrance and check in at the main information desk  You need to re-schedule your appointment should you arrive 10 or more minutes late.  We strive to give you quality time with our providers, and arriving late affects you and other patients whose appointments are after yours.  Also, if you no show three or more times for appointments you may be dismissed from the clinic at the providers discretion.     Again, thank you for choosing [facility].  Our hope is that these requests will decrease the amount of time that you wait before being seen by our physicians.       _____________________________________________________________  Should you have questions after your visit to [facility], please contact our office at [phone] between the hours of 8:00 a.m. and 4:30 p.m.  Voicemails left after 4:00 p.m. will not be returned until the following business day.  For prescription refill requests, have your pharmacy contact our office and allow 72 hours.    Cancer Center Support Programs:   > Cancer Support Group  2nd Tuesday of the month 1pm-2pm, Journey Room

## 2020-12-24 NOTE — Patient Instructions (Signed)
[place]  Discharge Instructions: Thank you for choosing [facility] to provide your oncology and hematology care.  If you have a lab appointment with the [facility], please come in thru the Main Entrance and check in at the main information desk.  Wear comfortable clothing and clothing appropriate for easy access to any Portacath or PICC line.   We strive to give you quality time with your provider. You may need to reschedule your appointment if you arrive late (15 or more minutes).  Arriving late affects you and other patients whose appointments are after yours.  Also, if you miss three or more appointments without notifying the office, you may be dismissed from the clinic at the provider's discretion.      For prescription refill requests, have your pharmacy contact our office and allow 72 hours for refills to be completed.    Today you port flush.    BELOW ARE SYMPTOMS THAT SHOULD BE REPORTED IMMEDIATELY: *FEVER GREATER THAN 100.4 F (38 C) OR HIGHER *CHILLS OR SWEATING *NAUSEA AND VOMITING THAT IS NOT CONTROLLED WITH YOUR NAUSEA MEDICATION *UNUSUAL SHORTNESS OF BREATH *UNUSUAL BRUISING OR BLEEDING *URINARY PROBLEMS (pain or burning when urinating, or frequent urination) *BOWEL PROBLEMS (unusual diarrhea, constipation, pain near the anus) TENDERNESS IN MOUTH AND THROAT WITH OR WITHOUT PRESENCE OF ULCERS (sore throat, sores in mouth, or a toothache) UNUSUAL RASH, SWELLING OR PAIN  UNUSUAL VAGINAL DISCHARGE OR ITCHING   Items with * indicate a potential emergency and should be followed up as soon as possible or go to the Emergency Department if any problems should occur.  Please show the CHEMOTHERAPY ALERT CARD or IMMUNOTHERAPY ALERT CARD at check-in to the Emergency Department and triage nurse.  Should you have questions after your visit or need to cancel or reschedule your appointment, please contact [facility] [phone]  and follow  the prompts.  Office hours are 8:00 a.m. to 4:30 p.m. Monday - Friday. Please note that voicemails left after 4:00 p.m. may not be returned until the following business day.  We are closed weekends and major holidays. You have access to a nurse at all times for urgent questions. Please call the main number to the clinic [phone] and follow the prompts.  For any non-urgent questions, you may also contact your provider using MyChart. We now offer e-Visits for anyone 93 and older to request care online for non-urgent symptoms. For details visit [email or web address].   Also download the MyChart app! Go to the app store, search "MyChart", open the app, select [facility], and log in with your MyChart username and password.  Due to Covid, a mask is required upon entering the hospital/clinic. If you do not have a mask, one will be given to you upon arrival. For doctor visits, patients may have 1 support person aged 56 or older with them. For treatment visits, patients cannot have anyone with them due to current Covid guidelines and our immunocompromised population.

## 2020-12-26 DIAGNOSIS — I110 Hypertensive heart disease with heart failure: Secondary | ICD-10-CM | POA: Diagnosis not present

## 2020-12-26 DIAGNOSIS — I4892 Unspecified atrial flutter: Secondary | ICD-10-CM | POA: Diagnosis not present

## 2020-12-26 DIAGNOSIS — Z4801 Encounter for change or removal of surgical wound dressing: Secondary | ICD-10-CM | POA: Diagnosis not present

## 2020-12-26 DIAGNOSIS — Z48812 Encounter for surgical aftercare following surgery on the circulatory system: Secondary | ICD-10-CM | POA: Diagnosis not present

## 2020-12-26 DIAGNOSIS — I509 Heart failure, unspecified: Secondary | ICD-10-CM | POA: Diagnosis not present

## 2020-12-26 DIAGNOSIS — E1151 Type 2 diabetes mellitus with diabetic peripheral angiopathy without gangrene: Secondary | ICD-10-CM | POA: Diagnosis not present

## 2020-12-28 ENCOUNTER — Other Ambulatory Visit: Payer: Self-pay | Admitting: Family

## 2020-12-28 DIAGNOSIS — E1142 Type 2 diabetes mellitus with diabetic polyneuropathy: Secondary | ICD-10-CM

## 2021-01-02 DIAGNOSIS — I4892 Unspecified atrial flutter: Secondary | ICD-10-CM | POA: Diagnosis not present

## 2021-01-02 DIAGNOSIS — I509 Heart failure, unspecified: Secondary | ICD-10-CM | POA: Diagnosis not present

## 2021-01-02 DIAGNOSIS — I110 Hypertensive heart disease with heart failure: Secondary | ICD-10-CM | POA: Diagnosis not present

## 2021-01-02 DIAGNOSIS — Z4801 Encounter for change or removal of surgical wound dressing: Secondary | ICD-10-CM | POA: Diagnosis not present

## 2021-01-02 DIAGNOSIS — E1151 Type 2 diabetes mellitus with diabetic peripheral angiopathy without gangrene: Secondary | ICD-10-CM | POA: Diagnosis not present

## 2021-01-02 DIAGNOSIS — Z48812 Encounter for surgical aftercare following surgery on the circulatory system: Secondary | ICD-10-CM | POA: Diagnosis not present

## 2021-01-04 ENCOUNTER — Other Ambulatory Visit: Payer: Self-pay | Admitting: Family

## 2021-01-22 ENCOUNTER — Other Ambulatory Visit: Payer: Medicare Other

## 2021-01-22 ENCOUNTER — Other Ambulatory Visit: Payer: Self-pay

## 2021-01-22 DIAGNOSIS — E039 Hypothyroidism, unspecified: Secondary | ICD-10-CM | POA: Diagnosis not present

## 2021-01-23 ENCOUNTER — Ambulatory Visit (INDEPENDENT_AMBULATORY_CARE_PROVIDER_SITE_OTHER): Payer: Medicare Other

## 2021-01-23 DIAGNOSIS — Z23 Encounter for immunization: Secondary | ICD-10-CM | POA: Diagnosis not present

## 2021-01-23 LAB — T4, FREE: Free T4: 1.14 ng/dL (ref 0.82–1.77)

## 2021-01-23 LAB — TSH: TSH: 3.43 u[IU]/mL (ref 0.450–4.500)

## 2021-01-24 ENCOUNTER — Ambulatory Visit: Payer: Medicare Other | Admitting: Nurse Practitioner

## 2021-01-28 ENCOUNTER — Ambulatory Visit: Payer: Medicare Other | Admitting: Nurse Practitioner

## 2021-02-05 ENCOUNTER — Ambulatory Visit: Payer: Medicare Other | Admitting: Nurse Practitioner

## 2021-02-05 DIAGNOSIS — M79676 Pain in unspecified toe(s): Secondary | ICD-10-CM | POA: Diagnosis not present

## 2021-02-05 DIAGNOSIS — E1142 Type 2 diabetes mellitus with diabetic polyneuropathy: Secondary | ICD-10-CM | POA: Diagnosis not present

## 2021-02-05 DIAGNOSIS — L84 Corns and callosities: Secondary | ICD-10-CM | POA: Diagnosis not present

## 2021-02-05 DIAGNOSIS — B351 Tinea unguium: Secondary | ICD-10-CM | POA: Diagnosis not present

## 2021-02-13 NOTE — Patient Instructions (Signed)

## 2021-02-14 ENCOUNTER — Other Ambulatory Visit: Payer: Self-pay

## 2021-02-14 ENCOUNTER — Ambulatory Visit (INDEPENDENT_AMBULATORY_CARE_PROVIDER_SITE_OTHER): Payer: Medicare Other | Admitting: Nurse Practitioner

## 2021-02-14 ENCOUNTER — Encounter: Payer: Self-pay | Admitting: Nurse Practitioner

## 2021-02-14 VITALS — BP 99/55 | HR 97 | Ht 66.0 in | Wt 162.2 lb

## 2021-02-14 DIAGNOSIS — E782 Mixed hyperlipidemia: Secondary | ICD-10-CM

## 2021-02-14 DIAGNOSIS — E039 Hypothyroidism, unspecified: Secondary | ICD-10-CM | POA: Diagnosis not present

## 2021-02-14 DIAGNOSIS — E1142 Type 2 diabetes mellitus with diabetic polyneuropathy: Secondary | ICD-10-CM

## 2021-02-14 MED ORDER — RYBELSUS 7 MG PO TABS: 7.0000 mg | ORAL_TABLET | Freq: Every day | ORAL | 3 refills | Status: DC

## 2021-02-14 NOTE — Progress Notes (Signed)
Endocrinology Follow Up Note       02/14/2021, 1:39 PM   Subjective:    Patient ID: John [name], male    DOB: 04/29/1945.  John [name] is being seen in follow up after being seen in consultation for management of currently uncontrolled symptomatic diabetes requested by  [name], FNP.   Past Medical History:  Diagnosis Date   Adenocarcinoma of lung, right ([place]) 04/18/2016   Anxiety    Arthritis    Asthma    BPH (benign prostatic hyperplasia)    with urinary retention 02/06/20   CHF (congestive heart failure) (HCC)    COPD (chronic obstructive pulmonary disease) ([place])    Depression    Diabetes mellitus without complication ([place])    no meds   Diabetes mellitus, type II ([place])    DM (diabetes mellitus) ([place]) 07/09/2016   Dyspnea    History of kidney stones    History of radiation therapy 06/25/16-08/20/16   right lung   History of radiation therapy 09/17/2020   right lung 09/10/2020-09/17/2020   Dr Sondra Come   Hyperlipidemia    Hypertension    Hypertension    Hypothyroidism    Macular degeneration    Neuropathy    Non-small cell lung cancer, right ([place]) 04/18/2016   Peripheral vascular disease ([place])    Prostatitis    Pulmonary nodule, left 07/16/2016   Sleep apnea    cpap    Past Surgical History:  Procedure Laterality Date   ABDOMINAL AORTOGRAM W/LOWER EXTREMITY Left 02/06/2020   Procedure: ABDOMINAL AORTOGRAM W/LOWER EXTREMITY;  Surgeon: Lorretta Harp, MD;  Location: [facility];  Service: Cardiovascular;  Laterality: Left;   CARDIOVERSION N/A 10/05/2020   Procedure: CARDIOVERSION;  Surgeon: Geralynn Rile, MD;  Location: [facility];  Service: Cardiovascular;  Laterality: N/A;   CATARACT EXTRACTION, BILATERAL Bilateral    ENDARTERECTOMY FEMORAL Right 08/20/2020   Procedure: ENDARTERECTOMY  RIGHT FEMORAL ARTERY;  Surgeon: Marty Heck, MD;  Location: [facility];   Service: Vascular;  Laterality: Right;   ESOPHAGOGASTRODUODENOSCOPY (EGD) WITH PROPOFOL N/A 09/06/2020   Procedure: ESOPHAGOGASTRODUODENOSCOPY (EGD) WITH PROPOFOL;  Surgeon: Irene Shipper, MD;  Location: [facility];  Service: Endoscopy;  Laterality: N/A;   INSERTION OF ILIAC STENT Right 08/20/2020   Procedure: RETROGRADE INSERTION OF RIGHT ILIAC STENT;  Surgeon: Marty Heck, MD;  Location: [facility];  Service: Vascular;  Laterality: Right;   INTRAOPERATIVE ARTERIOGRAM Right 08/20/2020   Procedure: INTRA OPERATIVE ARTERIOGRAM ILIAC;  Surgeon: Marty Heck, MD;  Location: [facility];  Service: Vascular;  Laterality: Right;   PATCH ANGIOPLASTY Right 08/20/2020   Procedure: PATCH ANGIOPLASTY RIGHT FEMORAL ARTERY;  Surgeon: Marty Heck, MD;  Location: [facility];  Service: Vascular;  Laterality: Right;   PORTACATH PLACEMENT Left 06/13/2016   Procedure: INSERTION PORT-A-CATH;  Surgeon: Aviva Signs, MD;  Location: [facility];  Service: General;  Laterality: Left;   TRANSURETHRAL RESECTION OF PROSTATE N/A 05/31/2020   Procedure: TRANSURETHRAL RESECTION OF THE PROSTATE (TURP);  Surgeon: Cleon Gustin, MD;  Location: [facility];  Service: Urology;  Laterality: N/A;   VIDEO BRONCHOSCOPY WITH ENDOBRONCHIAL NAVIGATION N/A 05/28/2016   Procedure: VIDEO BRONCHOSCOPY  WITH ENDOBRONCHIAL NAVIGATION;  Surgeon: [name], MD;  Location: [facility];  Service: Thoracic;  Laterality: N/A;   VIDEO BRONCHOSCOPY WITH ENDOBRONCHIAL ULTRASOUND N/A 05/28/2016   Procedure: VIDEO BRONCHOSCOPY WITH ENDOBRONCHIAL ULTRASOUND;  Surgeon: [name], MD;  Location[facility];  Service: Thoracic;  Laterality: N/A;    Social History   Socioeconomic History   Marital status: Soil scientist    Spouse name: Not on file   Number of children: 5   Years of education: 10   Highest education level: 10th grade  Occupational History   Occupation: Retired  Tobacco Use   Smoking status: Every Day    Packs/day: 0.50     Years: 55.00    Pack years: 27.50    Types: Cigarettes    Start date: 03/11/1961   Smokeless tobacco: Never   Tobacco comments:    1/2 pack to 1 pack per day 12/14/19  Vaping Use   Vaping Use: Never used  Substance and Sexual Activity   Alcohol use: No   Drug use: No   Sexual activity: Yes    Birth control/protection: None  Other Topics Concern   Not on file  Social History Narrative   [name] is his POA and lives with him. Children out of town - one in [facility], others in [place].   Social Determinants of Health   Financial Resource Strain: Low Risk    Difficulty of Paying Living Expenses: Not hard at all  Food Insecurity: No Food Insecurity   Worried About Charity fundraiser in the Last Year: Never true   [place] in the Last Year: Never true  Transportation Needs: No Transportation Needs   Lack of Transportation (Medical): No   Lack of Transportation (Non-Medical): No  Physical Activity: Unknown   Days of Exercise per Week: 7 days   Minutes of Exercise per Session: Not on file  Stress: No Stress Concern Present   Feeling of Stress : Not at all  Social Connections: Moderately Isolated   Frequency of Communication with Friends and Family: More than three times a week   Frequency of Social Gatherings with Friends and Family: Once a week   Attends Religious Services: Never   Marine scientist or Organizations: No   Attends Music therapist: Never   Marital Status: Living with partner    Family History  Problem Relation Age of Onset   Hypertension Mother    Diabetes Father    Heart disease Father    Stroke Father    Hypertension Sister     Outpatient Encounter Medications as of 02/14/2021  Medication Sig   albuterol (VENTOLIN HFA) 108 (90 Base) MCG/ACT inhaler Inhale 2 puffs into the lungs every 4 (four) hours as needed for wheezing or shortness of breath.   apixaban (ELIQUIS) 5 MG TABS tablet Take 1 tablet (5 mg total) by mouth 2 (two)  times daily.   Blood Glucose Monitoring Suppl (ONETOUCH VERIO REFLECT) w/Device KIT Use to test blood sugars daily as directed. DX: E11.9   clopidogrel (PLAVIX) 75 MG tablet Take 1 tablet (75 mg total) by mouth daily.   doxycycline (VIBRAMYCIN) 100 MG capsule Take 100 mg by mouth 2 (two) times daily.   fluticasone (FLONASE) 50 MCG/ACT nasal spray Place 1 spray into both nostrils daily as needed for allergies or rhinitis.   Fluticasone-Umeclidin-Vilant (TRELEGY ELLIPTA) 100-62.5-25 MCG/INH AEPB Inhale 1 puff into the lungs daily.   gabapentin (NEURONTIN) 300 MG  capsule TAKE 1 CAPSULE BY MOUTH THREE TIMES A DAY   hydrOXYzine (VISTARIL) 25 MG capsule Take 1 capsule (25 mg total) by mouth 3 (three) times daily as needed for itching.   Iron, Ferrous Sulfate, 325 (65 Fe) MG TABS Take 325 mg by mouth 2 (two) times daily.   KLOR-CON M20 20 MEQ tablet TAKE 1 TABLET BY MOUTH EVERY DAY   levocetirizine (XYZAL) 5 MG tablet Take 1 tablet (5 mg total) by mouth at bedtime.   levothyroxine (SYNTHROID) 50 MCG tablet Take 1 tablet (50 mcg total) by mouth daily.   linaclotide (LINZESS) 145 MCG CAPS capsule Take 1 capsule (145 mcg total) by mouth daily. To regulate bowel movements   mupirocin ointment (BACTROBAN) 2 % Apply 1 application topically 2 (two) times daily as needed (wound care).   nebivolol (BYSTOLIC) 2.5 MG tablet Take 1 tablet (2.5 mg total) by mouth daily.   nystatin (MYCOSTATIN) 100000 UNIT/ML suspension Take 5 mLs (500,000 Units total) by mouth 4 (four) times daily.   ondansetron (ZOFRAN-ODT) 4 MG disintegrating tablet Take 1-2 tablets (4-8 mg total) by mouth every 8 (eight) hours as needed for nausea or vomiting.   OneTouch Delica Lancets 16S MISC Use to test blood sugars daily as directed. DX: E11.9   ONETOUCH VERIO test strip USE TO TEST BLOOD SUGARS DAILY AS DIRECTED. DX: E11.9   pantoprazole (PROTONIX) 40 MG tablet Take 1 tablet (40 mg total) by mouth daily.   pravastatin (PRAVACHOL) 40 MG  tablet TAKE 1 TABLET (40 MG TOTAL) BY MOUTH ON MONDAYS , WEDNESDAYS, AND FRIDAYS AS DIRECTED   senna-docusate (SENOKOT-S) 8.6-50 MG tablet Take 1 tablet by mouth 2 (two) times daily between meals as needed for mild constipation.   Torsemide 40 MG TABS Take 40 mg by mouth daily. Can take an additional 34m in the afternoon as needed for weight gain (3lbs in 1 day or 5lbs in 1 week) or worsening lower extremity edema or shortness of breath.   triamcinolone cream (KENALOG) 0.1 % Apply 1 application topically 2 (two) times daily.   zolpidem (AMBIEN) 5 MG tablet Take 1 tablet (5 mg total) by mouth at bedtime as needed for sleep.   [DISCONTINUED] Semaglutide (RYBELSUS) 7 MG TABS Take 7 mg by mouth daily.   Semaglutide (RYBELSUS) 7 MG TABS Take 7 mg by mouth daily.   [DISCONTINUED] acetaminophen (TYLENOL) 650 MG CR tablet Take 650 mg by mouth every 8 (eight) hours as needed for pain. (Patient not taking: No sig reported)   [DISCONTINUED] metFORMIN (GLUCOPHAGE-XR) 500 MG 24 hr tablet TAKE 1 TABLET BY MOUTH EVERY DAY WITH BREAKFAST (Patient not taking: Reported on 02/14/2021)   No facility-administered encounter medications on file as of 02/14/2021.    ALLERGIES: Allergies  Allergen Reactions   Sulfa Antibiotics Swelling    Mouth swelling   Atorvastatin Other (See Comments)    Muscle aches - tolerating Pravastatin 40 mg MWF   Jardiance [Empagliflozin] Other (See Comments)    FEELS SLUGGISH, TIRED   Lopressor [Metoprolol] Other (See Comments)    Fatigue   Rosuvastatin Other (See Comments)    Muscle aches - tolerating Pravastatin 40 mg MWF   Temazepam Other (See Comments)    Made insomnia worse     VACCINATION STATUS: Immunization History  Administered Date(s) Administered   Fluad Quad(high Dose 65+) 01/19/2019, 02/01/2020, 01/23/2021   Influenza, High Dose Seasonal PF 12/28/2015   Influenza,inj,Quad PF,6+ Mos 01/23/2017   Moderna Sars-Covid-2 Vaccination 05/10/2019, 06/07/2019, 01/31/2020  Pneumococcal Conjugate-13 06/17/2017   Pneumococcal Polysaccharide-23 04/25/2019   Tdap 05/17/2019   Zoster, Live 07/10/2014    Diabetes He presents for his follow-up diabetic visit. He has type 2 diabetes mellitus. Onset time: Was diagnosed at approx age of 7. His disease course has been improving. There are no hypoglycemic associated symptoms. Pertinent negatives for hypoglycemia include no nervousness/anxiousness or tremors. Associated symptoms include blurred vision and foot paresthesias. Pertinent negatives for diabetes include no fatigue, no polydipsia, no polyphagia and no weight loss. There are no hypoglycemic complications. Symptoms are improving. Diabetic complications include heart disease, nephropathy, peripheral neuropathy and PVD. Risk factors for coronary artery disease include diabetes mellitus, dyslipidemia, hypertension, male sex, sedentary lifestyle and tobacco exposure. Current diabetic treatment includes oral agent (monotherapy). He is compliant with treatment most of the time. His weight is increasing steadily. He is following a generally healthy diet. When asked about meal planning, he reported none. He has not had a previous visit with a dietitian. He rarely participates in exercise. His home blood glucose trend is fluctuating minimally. His breakfast blood glucose range is generally 130-140 mg/dl. (He presents today with his meter and logs showing stable glycemic profile with at target fasting glucose readings.  His previsit A1c was 4.7% on 8/29 at his PCP office and they took him off his Metformin at that time.  He is currently undergoing treatment for lung cancer.  He denies any hypoglycemia.) An ACE inhibitor/angiotensin II receptor blocker is being taken. He sees a podiatrist.Eye exam is current.  Hyperlipidemia This is a chronic problem. The current episode started more than 1 year ago. The problem is controlled. Recent lipid tests were reviewed and are normal. Exacerbating  diseases include chronic renal disease, diabetes and hypothyroidism. Factors aggravating his hyperlipidemia include fatty foods and beta blockers. Current antihyperlipidemic treatment includes statins. The current treatment provides moderate improvement of lipids. There are no compliance problems.  Risk factors for coronary artery disease include diabetes mellitus, dyslipidemia, hypertension, male sex and a sedentary lifestyle.  Hypertension This is a chronic problem. The current episode started more than 1 year ago. The problem has been gradually improving since onset. The problem is controlled. Associated symptoms include blurred vision and palpitations. Agents associated with hypertension include thyroid hormones. Risk factors for coronary artery disease include diabetes mellitus, dyslipidemia, sedentary lifestyle, smoking/tobacco exposure and male gender. Past treatments include angiotensin blockers, diuretics, beta blockers and alpha 1 blockers. The current treatment provides moderate improvement. There are no compliance problems.  Hypertensive end-organ damage includes kidney disease, CAD/MI and PVD. Identifiable causes of hypertension include chronic renal disease, sleep apnea and a thyroid problem.  Thyroid Problem Presents for follow-up visit. Symptoms include palpitations. Patient reports no anxiety, cold intolerance, constipation, depressed mood, diarrhea, dry skin, fatigue, heat intolerance, tremors or weight loss. (Having cardioversion on 7/1 for Afib) The symptoms have been stable. Past treatments include levothyroxine. The treatment provided moderate relief. His past medical history is significant for diabetes and hyperlipidemia. There are no known risk factors.    Review of systems  Constitutional: + steadily increasing body weight,  current Body mass index is 26.18 kg/m. , no fatigue, no subjective hyperthermia, no subjective hypothermia Eyes: no blurry vision, no xerophthalmia ENT: no  sore throat, no nodules palpated in throat, no dysphagia/odynophagia, no hoarseness Cardiovascular: no chest pain, no shortness of breath, no palpitations, no leg swelling Respiratory: no cough, no shortness of breath- undergoing treatment for lung cancer Gastrointestinal: no nausea/vomiting/diarrhea Musculoskeletal: no muscle/joint aches Skin: no  rashes, no hyperemia Neurological: no tremors, no numbness, no tingling, no dizziness Psychiatric: no depression, no anxiety  Objective:     BP (!) 99/55   Pulse 97   Ht 5' 6"  (1.676 m)   Wt 162 lb 3.2 oz (73.6 kg)   BMI 26.18 kg/m   Wt Readings from Last 3 Encounters:  02/14/21 162 lb 3.2 oz (73.6 kg)  12/24/20 157 lb 9.6 oz (71.5 kg)  12/03/20 154 lb (69.9 kg)     BP Readings from Last 3 Encounters:  02/14/21 (!) 99/55  12/24/20 (!) 124/48  12/03/20 134/75      Physical Exam- Limited  Constitutional:  Body mass index is 26.18 kg/m. , not in acute distress, normal state of mind Eyes:  EOMI, no exophthalmos Cardiovascular: RRR, no murmurs, rubs, or gallops, no edema Respiratory: Adequate breathing efforts, no crackles, rales, rhonchi, or wheezing Musculoskeletal: no gross deformities, strength intact in all four extremities, no gross restriction of joint movements Skin:  no rashes, no hyperemia Neurological: no tremor with outstretched hands    CMP ( most recent) CMP     Component Value Date/Time   NA 136 12/17/2020 1005   NA 141 12/03/2020 1226   K 3.8 12/17/2020 1005   CL 96 (L) 12/17/2020 1005   CO2 29 12/17/2020 1005   GLUCOSE 222 (H) 12/17/2020 1005   BUN 12 12/17/2020 1005   BUN 11 12/03/2020 1226   CREATININE 0.95 12/17/2020 1005   CALCIUM 9.0 12/17/2020 1005   PROT 7.0 12/17/2020 1005   PROT 7.0 12/03/2020 1226   ALBUMIN 3.7 12/17/2020 1005   ALBUMIN 4.4 12/03/2020 1226   AST 19 12/17/2020 1005   ALT 9 12/17/2020 1005   ALKPHOS 98 12/17/2020 1005   BILITOT 0.3 12/17/2020 1005   BILITOT <0.2  12/03/2020 1226   GFRNONAA >60 12/17/2020 1005   GFRAA 102 05/24/2020 1238     Diabetic Labs (most recent): Lab Results  Component Value Date   HGBA1C 4.7 12/03/2020   HGBA1C 7.5 (H) 08/20/2020   HGBA1C 6.4 08/03/2020     Lipid Panel ( most recent) Lipid Panel     Component Value Date/Time   CHOL 126 08/21/2020 0530   CHOL 136 04/25/2019 1608   TRIG 89 08/21/2020 0530   HDL 28 (L) 08/21/2020 0530   HDL 28 (L) 04/25/2019 1608   CHOLHDL 4.5 08/21/2020 0530   VLDL 18 08/21/2020 0530   LDLCALC 80 08/21/2020 0530   LDLCALC 78 04/25/2019 1608   LABVLDL 30 04/25/2019 1608      Lab Results  Component Value Date   TSH 3.430 01/22/2021   TSH 5.033 (H) 12/17/2020   TSH 3.410 10/26/2020   TSH 0.628 08/21/2020   TSH 1.542 08/13/2020   TSH 2.180 08/03/2020   TSH 0.117 (L) 06/02/2020   TSH 1.503 03/23/2020   TSH 1.620 03/20/2020   TSH 0.752 03/03/2020   FREET4 1.14 01/22/2021   FREET4 1.96 (H) 06/03/2020   FREET4 1.03 02/20/2017           Assessment & Plan:   1) Type 2 diabetes mellitus with diabetic polyneuropathy, without long-term current use of insulin (HCC)  - ANIL HAVARD has currently uncontrolled symptomatic type 2 DM since 75 years of age.  He presents today with his meter and logs showing stable glycemic profile with at target fasting glucose readings.  His previsit A1c was 4.7% on 8/29 at his PCP office and they took him off his Metformin at that  time.  He is currently undergoing treatment for lung cancer.  He denies any hypoglycemia.  -Recent labs reviewed.  - I had a long discussion with him about the progressive nature of diabetes and the pathology behind its complications. -his diabetes is complicated by CKD stage 2, PAD, and neuropathy and he remains at a high risk for more acute and chronic complications which include CAD, CVA, and retinopathy. These are all discussed in detail with him.  - Nutritional counseling repeated at each appointment due  to patients tendency to fall back in to old habits.  - The patient admits there is a room for improvement in their diet and drink choices. -  Suggestion is made for the patient to avoid simple carbohydrates from their diet including Cakes, Sweet Desserts / Pastries, Ice Cream, Soda (diet and regular), Sweet Tea, Candies, Chips, Cookies, Sweet Pastries, Store Bought Juices, Alcohol in Excess of 1-2 drinks a day, Artificial Sweeteners, Coffee Creamer, and "Sugar-free" Products. This will help patient to have stable blood glucose profile and potentially avoid unintended weight gain.   - I encouraged the patient to switch to unprocessed or minimally processed complex starch and increased protein intake (animal or plant source), fruits, and vegetables.   - Patient is advised to stick to a routine mealtimes to eat 3 meals a day and avoid unnecessary snacks (to snack only to correct hypoglycemia).  - he will be scheduled with Jearld Fenton, RDN, CDE for diabetes education.  - I have approached him with the following individualized plan to manage  his diabetes and patient agrees:   -His recent low A1c number may have been affected by his anemia given his ongoing treatment for lung cancer.  Given his stable glycemic profile, he is advised to continue his Rybelsus 7 mg po daily before breakfast.  He can stay off the Metformin for now.   -he is encouraged to continue monitoring blood glucose once daily, before breakfast and to call the clinic if he has readings less than 70 or greater than 200 for 3 tests in a row.   - Specific targets for  A1c;  LDL, HDL,  and Triglycerides were discussed with the patient.  2) Blood Pressure /Hypertension:  his blood pressure is controlled to target (slightly low today).   he is advised to continue his current medications including Bystolic 2.5 mg po daily and Torsemide 40 mg po daily.  3) Lipids/Hyperlipidemia:    Review of his recent lipid panel from 08/21/20 showed  controlled  LDL at 80.  he  is advised to continue Pravachol 40 mg daily at bedtime.  Side effects and precautions discussed with him.  4)  Weight/Diet:  his Body mass index is 26.18 kg/m.  -  he is is not a candidate for major weight loss. I discussed with him the fact that loss of 5 - 10% of his  current body weight will have the most impact on his diabetes management.  Exercise, and detailed carbohydrates information provided  -  detailed on discharge instructions.  5) Hypothyroidism: His previsit thyroid function tests are consistent with appropriate hormone replacement.  He is advised to continue Levothyroxine 50 mcg po daily before breakfast.   - The correct intake of thyroid hormone (Levothyroxine, Synthroid), is on empty stomach first thing in the morning, with water, separated by at least 30 minutes from breakfast and other medications,  and separated by more than 4 hours from calcium, iron, multivitamins, acid reflux medications (PPIs).  - This  medication is a life-long medication and will be needed to correct thyroid hormone imbalances for the rest of your life.  The dose may change from time to time, based on thyroid blood work.  - It is extremely important to be consistent taking this medication, near the same time each morning.  -AVOID TAKING PRODUCTS CONTAINING BIOTIN (commonly found in Hair, Skin, Nails vitamins) AS IT INTERFERES WITH THE VALIDITY OF THYROID FUNCTION BLOOD TESTS.  6) Chronic Care/Health Maintenance: -he is on ACEI/ARB and Statin medications and is encouraged to initiate and continue to follow up with Ophthalmology, Dentist, Podiatrist at least yearly or according to recommendations, and advised to [place]. I have recommended yearly flu vaccine and pneumonia vaccine at least every 5 years; moderate intensity exercise for up to 150 minutes weekly; and sleep for at least 7 hours a day.  - he is advised to maintain close follow up with [name], FNP for  primary care needs, as well as his other providers for optimal and coordinated care.      I spent 30 minutes in the care of the patient today including review of labs from [facility], Lipids, Thyroid Function, Hematology (current and previous including abstractions from other facilities); face-to-face time discussing  his blood glucose readings/logs, discussing hypoglycemia and hyperglycemia episodes and symptoms, medications doses, his options of short and long term treatment based on the latest standards of care / guidelines;  discussion about incorporating lifestyle medicine;  and documenting the encounter.    Please refer to Patient Instructions for Blood Glucose Monitoring and Insulin/Medications Dosing Guide"  in media tab for additional information. Please  also refer to " Patient Self Inventory" in the Media  tab for reviewed elements of pertinent patient history.  Frederica Kuster participated in the discussions, expressed understanding, and voiced agreement with the above plans.  All questions were answered to his satisfaction. he is encouraged to contact clinic should he have any questions or concerns prior to his return visit.   Follow up plan: - Return in about 6 months (around 08/14/2021) for Diabetes F/U with A1c in office, Thyroid follow up, No previsit labs, Bring meter and logs.  Rayetta Pigg, [facility] [address] [place], [place] [address] Phone: [phone] Fax: [phone]  02/14/2021, 1:39 PM

## 2021-02-19 ENCOUNTER — Encounter (HOSPITAL_COMMUNITY): Payer: Medicare Other

## 2021-02-19 ENCOUNTER — Ambulatory Visit: Payer: Medicare Other

## 2021-02-21 ENCOUNTER — Other Ambulatory Visit: Payer: Self-pay | Admitting: Cardiovascular Disease

## 2021-02-24 NOTE — Progress Notes (Signed)
Cardiology Office Note:   Date:  02/26/2021  NAME:  John [name]    MRN: [id number] DOB:  [birth year]   PCP:  [name], FNP  Cardiologist:  Evalina Field, MD  Electrophysiologist:  None   Referring MD: [name], FNP   Chief Complaint  Patient presents with   Follow-up        History of Present Illness:   John [name] is a 75 y.o. male with a hx of tobacco abuse, AFL, HFpEF, PAD who presents for follow-up.  Overall reports he is doing well.  Denies any symptoms of chest pain or trouble breathing.  He is taking torsemide 40 mg in the morning as well as taking an extra tab in the afternoon.  Weights are stable.  No increased volume overload on exam today.  Pulse is regular.  No recurrence of atrial flutter.  He is still on Plavix due to prior peripheral stenting.  We discussed switching to aspirin.  6 months since his stent was placed.  I think this is reasonable.  He is still smoking several cigarettes per day.  Encouraged to quit this.  His blood pressure is 132/62.  Well-controlled on current medications.  Most recent LDL cholesterol not at goal.  He can only tolerate pravastatin 40 mg on Mondays Wednesdays and Fridays.  We discussed trying Zetia to get his LDL cholesterol at goal.  He is okay to do this.  Not exercising much but denies any chest pain or trouble breathing with his current level of activity.  Overall doing quite well.  Per review of records his non-small cell lung cancer is being followed.  There is no concern for recurrence as of yet.  No further anemia or issues.  Diabetes is well controlled.  Problem List 1. Coronary calcifications 2. Atrial flutter -Dx 08/21/2020 -CHADSVASC=5 (age, DM, PAD, HTN, CHF) -DCCV 10/05/2020 3. PAD -R femoral endarterectomy 08/20/2020 -R common iliac artery stent 08/20/2020 4. Non-small cell lung CA -2018 s/p chemo/radiation  -recurrent nodules without recurrence  5. COPD 6. Tobacco abuse 7. Gastric ulcer/Recurrent  anemia -EGD 09/2020 8. DM -A1c 4.7 9. HLD -T chol 126, HDL 28, LDL 80, TG 89 10. HFpEF  Past Medical History: Past Medical History:  Diagnosis Date   Adenocarcinoma of lung, right ([place]) 04/18/2016   Anxiety    Arthritis    Asthma    BPH (benign prostatic hyperplasia)    with urinary retention 02/06/20   CHF (congestive heart failure) ([facility])    COPD (chronic obstructive pulmonary disease) ([place])    Depression    Diabetes mellitus without complication ([place])    no meds   Diabetes mellitus, type II ([place])    DM (diabetes mellitus) ([place]) 07/09/2016   Dyspnea    History of kidney stones    History of radiation therapy 06/25/16-08/20/16   right lung   History of radiation therapy 09/17/2020   right lung 09/10/2020-09/17/2020   Dr Sondra Come   Hyperlipidemia    Hypertension    Hypertension    Hypothyroidism    Macular degeneration    Neuropathy    Non-small cell lung cancer, right ([place]) 04/18/2016   Peripheral vascular disease ([place])    Prostatitis    Pulmonary nodule, left 07/16/2016   Sleep apnea    cpap    Past Surgical History: Past Surgical History:  Procedure Laterality Date   ABDOMINAL AORTOGRAM W/LOWER EXTREMITY Left 02/06/2020   Procedure: ABDOMINAL AORTOGRAM W/LOWER EXTREMITY;  Surgeon: Quay Burow  J, MD;  Location: [facility];  Service: Cardiovascular;  Laterality: Left;   CARDIOVERSION N/A 10/05/2020   Procedure: CARDIOVERSION;  Surgeon: Geralynn Rile, MD;  Location: [facility];  Service: Cardiovascular;  Laterality: N/A;   CATARACT EXTRACTION, BILATERAL Bilateral    ENDARTERECTOMY FEMORAL Right 08/20/2020   Procedure: ENDARTERECTOMY  RIGHT FEMORAL ARTERY;  Surgeon: Marty Heck, MD;  Location: [facility];  Service: Vascular;  Laterality: Right;   ESOPHAGOGASTRODUODENOSCOPY (EGD) WITH PROPOFOL N/A 09/06/2020   Procedure: ESOPHAGOGASTRODUODENOSCOPY (EGD) WITH PROPOFOL;  Surgeon: Irene Shipper, MD;  Location: [facility];  Service: Endoscopy;  Laterality:  N/A;   INSERTION OF ILIAC STENT Right 08/20/2020   Procedure: RETROGRADE INSERTION OF RIGHT ILIAC STENT;  Surgeon: Marty Heck, MD;  Location: [facility];  Service: Vascular;  Laterality: Right;   INTRAOPERATIVE ARTERIOGRAM Right 08/20/2020   Procedure: INTRA OPERATIVE ARTERIOGRAM ILIAC;  Surgeon: Marty Heck, MD;  Location: [facility];  Service: Vascular;  Laterality: Right;   PATCH ANGIOPLASTY Right 08/20/2020   Procedure: PATCH ANGIOPLASTY RIGHT FEMORAL ARTERY;  Surgeon: Marty Heck, MD;  Location: [facility];  Service: Vascular;  Laterality: Right;   PORTACATH PLACEMENT Left 06/13/2016   Procedure: INSERTION PORT-A-CATH;  Surgeon: Aviva Signs, MD;  Location: [facility];  Service: General;  Laterality: Left;   TRANSURETHRAL RESECTION OF PROSTATE N/A 05/31/2020   Procedure: TRANSURETHRAL RESECTION OF THE PROSTATE (TURP);  Surgeon: Cleon Gustin, MD;  Location: [facility];  Service: Urology;  Laterality: N/A;   VIDEO BRONCHOSCOPY WITH ENDOBRONCHIAL NAVIGATION N/A 05/28/2016   Procedure: VIDEO BRONCHOSCOPY WITH ENDOBRONCHIAL NAVIGATION;  Surgeon: [name], MD;  Location: [facility];  Service: Thoracic;  Laterality: N/A;   VIDEO BRONCHOSCOPY WITH ENDOBRONCHIAL ULTRASOUND N/A 05/28/2016   Procedure: VIDEO BRONCHOSCOPY WITH ENDOBRONCHIAL ULTRASOUND;  Surgeon: [name], MD;  [facility]: [facility];  Service: Thoracic;  Laterality: N/A;    Current Medications: Current Meds  Medication Sig   albuterol (VENTOLIN HFA) 108 (90 Base) MCG/ACT inhaler Inhale 2 puffs into the lungs every 4 (four) hours as needed for wheezing or shortness of breath.   apixaban (ELIQUIS) 5 MG TABS tablet Take 1 tablet (5 mg total) by mouth 2 (two) times daily.   aspirin EC 81 MG tablet Take 1 tablet (81 mg total) by mouth daily. Swallow whole.   Blood Glucose Monitoring Suppl (ONETOUCH VERIO REFLECT) w/Device KIT Use to test blood sugars daily as directed. DX: E11.9   ezetimibe (ZETIA) 10 MG tablet Take 1  tablet (10 mg total) by mouth daily.   fluticasone (FLONASE) 50 MCG/ACT nasal spray Place 1 spray into both nostrils daily as needed for allergies or rhinitis.   Fluticasone-Umeclidin-Vilant (TRELEGY ELLIPTA) 100-62.5-25 MCG/INH AEPB Inhale 1 puff into the lungs daily.   gabapentin (NEURONTIN) 300 MG capsule TAKE 1 CAPSULE BY MOUTH THREE TIMES A DAY   hydrOXYzine (VISTARIL) 25 MG capsule Take 1 capsule (25 mg total) by mouth 3 (three) times daily as needed for itching. (Patient taking differently: Take 25 mg by mouth as needed for itching.)   Iron, Ferrous Sulfate, 325 (65 Fe) MG TABS Take 325 mg by mouth 2 (two) times daily.   KLOR-CON M20 20 MEQ tablet TAKE 1 TABLET BY MOUTH EVERY DAY   levocetirizine (XYZAL) 5 MG tablet Take 1 tablet (5 mg total) by mouth at bedtime.   linaclotide (LINZESS) 145 MCG CAPS capsule Take 1 capsule (145 mcg total) by mouth daily. To regulate bowel movements   mupirocin  ointment (BACTROBAN) 2 % Apply 1 application topically 2 (two) times daily as needed (wound care).   nebivolol (BYSTOLIC) 2.5 MG tablet Take 1 tablet (2.5 mg total) by mouth daily.   nystatin (MYCOSTATIN) 100000 UNIT/ML suspension Take 5 mLs (500,000 Units total) by mouth 4 (four) times daily.   ondansetron (ZOFRAN-ODT) 4 MG disintegrating tablet Take 1-2 tablets (4-8 mg total) by mouth every 8 (eight) hours as needed for nausea or vomiting.   OneTouch Delica Lancets 43P MISC Use to test blood sugars daily as directed. DX: E11.9   ONETOUCH VERIO test strip USE TO TEST BLOOD SUGARS DAILY AS DIRECTED. DX: E11.9   pantoprazole (PROTONIX) 40 MG tablet Take 1 tablet (40 mg total) by mouth daily.   pravastatin (PRAVACHOL) 40 MG tablet TAKE 1 TABLET (40 MG TOTAL) BY MOUTH ON MONDAYS , WEDNESDAYS, AND FRIDAYS AS DIRECTED   Semaglutide (RYBELSUS) 7 MG TABS Take 7 mg by mouth daily.   Torsemide 40 MG TABS Take 40 mg by mouth daily. Can take an additional 28m in the afternoon as needed for weight gain (3lbs in 1  day or 5lbs in 1 week) or worsening lower extremity edema or shortness of breath.   triamcinolone cream (KENALOG) 0.1 % Apply 1 application topically 2 (two) times daily.   zolpidem (AMBIEN) 5 MG tablet Take 1 tablet (5 mg total) by mouth at bedtime as needed for sleep.   [DISCONTINUED] clopidogrel (PLAVIX) 75 MG tablet Take 1 tablet (75 mg total) by mouth daily.   [DISCONTINUED] doxycycline (VIBRAMYCIN) 100 MG capsule Take 100 mg by mouth 2 (two) times daily.   [DISCONTINUED] senna-docusate (SENOKOT-S) 8.6-50 MG tablet Take 1 tablet by mouth 2 (two) times daily between meals as needed for mild constipation.     Allergies:    Sulfa antibiotics, Atorvastatin, Jardiance [empagliflozin], Lopressor [metoprolol], Rosuvastatin, and Temazepam   Social History: Social History   Socioeconomic History   Marital status: DSoil scientist   Spouse name: Not on file   Number of children: 5   Years of education: 10   Highest education level: 10th grade  Occupational History   Occupation: Retired  Tobacco Use   Smoking status: Every Day    Packs/day: 0.50    Years: 55.00    Pack years: 27.50    Types: Cigarettes    Start date: 03/11/1961   Smokeless tobacco: Never   Tobacco comments:    1/2 pack to 1 pack per day 12/14/19  Vaping Use   Vaping Use: Never used  Substance and Sexual Activity   Alcohol use: No   Drug use: No   Sexual activity: Yes    Birth control/protection: None  Other Topics Concern   Not on file  Social History Narrative   JBethena Roysis his POA and lives with him. [place] [facility] [place]   Social Determinants of Health   Financial Resource Strain: Low Risk    Difficulty of Paying Living Expenses: Not hard at all  Food Insecurity: No Food Insecurity   Worried About RCharity fundraiserin the Last Year: Never true   [place] Last Year: Never true  Transportation Needs: No Transportation Needs   Lack of Transportation  (Medical): No   Lack of Transportation (Non-Medical): No  Physical Activity: Unknown   Days of Exercise per Week: 7 days   Minutes of Exercise per Session: Not on file  Stress: No Stress  Concern Present   Feeling of Stress : Not at all  Social Connections: Moderately Isolated   Frequency of Communication with Friends and Family: More than three times a week   Frequency of Social Gatherings with Friends and Family: Once a week   Attends Religious Services: Never   Marine scientist or Organizations: No   Attends Music therapist: Never   Marital Status: Living with partner     Family History: The patient's family history includes Diabetes in his father; Heart disease in his father; Hypertension in his mother and sister; Stroke in his father.  ROS:   All other ROS reviewed and negative. Pertinent positives noted in the HPI.     EKGs/Labs/Other Studies Reviewed:   The following studies were personally reviewed by me today:  TTE 08/21/2020  1. Left ventricular ejection fraction, by estimation, is 60 to 65%. The  left ventricle has normal function. The left ventricle has no regional  wall motion abnormalities. Left ventricular diastolic parameters are  indeterminate.   2. Right ventricular systolic function is mildly reduced. The right  ventricular size is normal.   3. The mitral valve is abnormal. No evidence of mitral valve  regurgitation. The mean mitral valve gradient is 4.0 mmHg with average  heart rate of 87 bpm. Moderate mitral annular calcification.   4. The aortic valve is calcified. Aortic valve regurgitation is not  visualized. Mild to moderate aortic valve sclerosis/calcification is  present. Aortic valve area, by VTI measures 0.91 cm. Aortic valve mean  gradient measures 8.0 mmHg. Aortic valve  Vmax measures 1.88 m/s. Aortic valve acceleration time measures 97 msec.  There is discordance between the AVA and gradients; DVI is 0.36 with LV  stroke  volume index of 30; Aortic stenosis may be underestimated in this  study. Consider outpatient  monitoring if clinically indicated.   Recent Labs: 10/10/2020: Magnesium 1.9 10/16/2020: BNP 228.9 12/17/2020: ALT 9; BUN 12; Creatinine, Ser 0.95; Hemoglobin 11.4; Platelets 351; Potassium 3.8; Sodium 136 01/22/2021: TSH 3.430   Recent Lipid Panel    Component Value Date/Time   CHOL 126 08/21/2020 0530   CHOL 136 04/25/2019 1608   TRIG 89 08/21/2020 0530   HDL 28 (L) 08/21/2020 0530   HDL 28 (L) 04/25/2019 1608   CHOLHDL 4.5 08/21/2020 0530   VLDL 18 08/21/2020 0530   LDLCALC 80 08/21/2020 0530   LDLCALC 78 04/25/2019 1608    Physical Exam:   VS:  BP 132/62   Pulse 95   Ht 5' 6"  (1.676 m)   Wt 160 lb (72.6 kg)   SpO2 97%   BMI 25.82 kg/m    Wt Readings from Last 3 Encounters:  02/26/21 160 lb (72.6 kg)  02/14/21 162 lb 3.2 oz (73.6 kg)  12/24/20 157 lb 9.6 oz (71.5 kg)    General: Well nourished, well developed, in no acute distress Head: Atraumatic, normal size  Eyes: PEERLA, EOMI  Neck: Supple, no JVD Endocrine: No thryomegaly Cardiac: Normal S1, S2; RRR; no murmurs, rubs, or gallops Lungs: Clear to auscultation bilaterally, no wheezing, rhonchi or rales  Abd: Soft, nontender, no hepatomegaly  Ext: No edema, pulses 2+ Musculoskeletal: No deformities, BUE and BLE strength normal and equal Skin: Warm and dry, no rashes   Neuro: Alert and oriented to person, place, time, and situation, CNII-XII grossly intact, no focal deficits  Psych: Normal mood and affect   ASSESSMENT:   PRINTICE HELLMER is a 75 y.o. male who presents  for the following: 1. Chronic diastolic CHF (congestive heart failure) ([place])   2. Atrial flutter, unspecified type ([place])   3. PAD (peripheral artery disease) ([place])   4. Hyperlipidemia, unspecified hyperlipidemia type    PLAN:   1. Chronic diastolic CHF (congestive heart failure) (H[facility]) -Euvolemic on exam.  I have asked him to reduce his torsemide to 40  mg daily with an extra tablet as needed in the afternoon.  He takes 20 mEq potassium daily.  I would like to recheck a BMP today to make sure his kidney function is stable.  Blood pressure is well controlled.  Still smoking.  Encourage exercise more.  Overall euvolemic on exam and doing well.  2. Atrial flutter, unspecified type [facility]) -Diagnosed in May 2022 after PAD surgery.  Underwent cardioversion.  Maintaining sinus rhythm.  He will continue Eliquis 5 mg twice daily.  No further recurrence of arrhythmia.  Continue nebivolol 2.5 mg daily.  3. PAD (peripheral artery disease) ([place]) 4. Hyperlipidemia, unspecified hyperlipidemia type -Status post right femoral endarterectomy in May 2022.  Also underwent right common iliac artery stent.  No significant claudication symptoms.  Would recommend he transition off Plavix and is taking aspirin 81 mg daily.  He is completing 1 to 6 months of Plavix therapy.  Given that he is on a DOAC I think aspirin is reasonable. -Still smoking but encouraged to quit. -Most recent LDL cholesterol is not at goal.  Most recent LDL value 80.  On pravastatin 40 mg Monday Wednesday and Fridays.  He cannot tolerate high intensity statin therapy.  We discussed adding Zetia 10 mg daily to get his LDL cholesterol less than 70.  He is okay to do this. -All of his other CVD risk factors seem to be well controlled.  Blood pressure is well controlled today in office.  No symptoms concerning for angina. -Diabetes is very well controlled.  Disposition: Return in about 1 year (around 02/26/2022).  Medication Adjustments/Labs and Tests Ordered: Current medicines are reviewed at length with the patient today.  Concerns regarding medicines are outlined above.  Orders Placed This Encounter  Procedures   Basic metabolic panel    Meds ordered this encounter  Medications   ezetimibe (ZETIA) 10 MG tablet    Sig: Take 1 tablet (10 mg total) by mouth daily.    Dispense:  90 tablet     Refill:  3   aspirin EC 81 MG tablet    Sig: Take 1 tablet (81 mg total) by mouth daily. Swallow whole.    Dispense:  90 tablet    Refill:  3     Patient Instructions  Medication Instructions:  Start Zetia 10 mg daily  STOP Plavix  Start Aspirin 81 mg daily   Take Torsemide 40 mg daily and take 1 extra in the afternoons if needed.   *If you need a refill on your cardiac medications before your next appointment, please call your pharmacy*   Lab Work: BMET today   If you have labs (blood work) drawn today and your tests are completely normal, you will receive your results only by: [place] (if you have MyChart) OR A paper copy in the mail If you have any lab test that is abnormal or we need to change your treatment, we will call you to review the results.  Follow-Up: At [facility], you and your health needs are our priority.  As part of our continuing mission to provide you with exceptional heart care, we have  created designated Provider Care Teams.  These Care Teams include your primary Cardiologist (physician) and Advanced Practice Providers (APPs -  Physician Assistants and Nurse Practitioners) who all work together to provide you with the care you need, when you need it.  We recommend signing up for the patient portal called "MyChart".  Sign up information is provided on this After Visit Summary.  MyChart is used to connect with patients for Virtual Visits (Telemedicine).  Patients are able to view lab/test results, encounter notes, upcoming appointments, etc.  Non-urgent messages can be sent to your provider as well.   To learn more about what you can do with MyChart, go to [email or web address].    Your next appointment:   12 month(s)  The format for your next appointment:   In Person  Provider:   Sande Rives, PA-C or Almyra Deforest, PA-C   or Evalina Field, MD    Time Spent with Patient: I have spent a total of 35 minutes with patient reviewing hospital  notes, telemetry, EKGs, labs and examining the patient as well as establishing an assessment and plan that was discussed with the patient.  > 50% of time was spent in direct patient care.  Signed, Addison Naegeli. Audie Box, MD, [place]  [address] [place], [place] [address] [phone]  02/26/2021 3:34 PM

## 2021-02-26 ENCOUNTER — Other Ambulatory Visit: Payer: Self-pay

## 2021-02-26 ENCOUNTER — Ambulatory Visit (INDEPENDENT_AMBULATORY_CARE_PROVIDER_SITE_OTHER): Payer: Medicare Other | Admitting: Cardiovascular Disease

## 2021-02-26 ENCOUNTER — Encounter: Payer: Self-pay | Admitting: Cardiovascular Disease

## 2021-02-26 VITALS — BP 132/62 | HR 95 | Ht 66.0 in | Wt 160.0 lb

## 2021-02-26 DIAGNOSIS — I4892 Unspecified atrial flutter: Secondary | ICD-10-CM | POA: Diagnosis not present

## 2021-02-26 DIAGNOSIS — E785 Hyperlipidemia, unspecified: Secondary | ICD-10-CM | POA: Diagnosis not present

## 2021-02-26 DIAGNOSIS — I5032 Chronic diastolic (congestive) heart failure: Secondary | ICD-10-CM

## 2021-02-26 DIAGNOSIS — I739 Peripheral vascular disease, unspecified: Secondary | ICD-10-CM | POA: Diagnosis not present

## 2021-02-26 MED ORDER — ASPIRIN EC 81 MG PO TBEC: 81.0000 mg | DELAYED_RELEASE_TABLET | Freq: Every day | ORAL | 3 refills | Status: DC

## 2021-02-26 MED ORDER — EZETIMIBE 10 MG PO TABS: 10.0000 mg | ORAL_TABLET | Freq: Every day | ORAL | 3 refills | Status: DC

## 2021-02-26 NOTE — Patient Instructions (Signed)
Medication Instructions:  Start Zetia 10 mg daily  STOP Plavix  Start Aspirin 81 mg daily   Take Torsemide 40 mg daily and take 1 extra in the afternoons if needed.   *If you need a refill on your cardiac medications before your next appointment, please call your pharmacy*   Lab Work: BMET today   If you have labs (blood work) drawn today and your tests are completely normal, you will receive your results only by: [place] (if you have MyChart) OR A paper copy in the mail If you have any lab test that is abnormal or we need to change your treatment, we will call you to review the results.  Follow-Up: At [facility], you and your health needs are our priority.  As part of our continuing mission to provide you with exceptional heart care, we have created designated Provider Care Teams.  These Care Teams include your primary Cardiologist (physician) and Advanced Practice Providers (APPs -  Physician Assistants and Nurse Practitioners) who all work together to provide you with the care you need, when you need it.  We recommend signing up for the patient portal called "MyChart".  Sign up information is provided on this After Visit Summary.  MyChart is used to connect with patients for Virtual Visits (Telemedicine).  Patients are able to view lab/test results, encounter notes, upcoming appointments, etc.  Non-urgent messages can be sent to your provider as well.   To learn more about what you can do with MyChart, go to [email or web address].    Your next appointment:   12 month(s)  The format for your next appointment:   In Person  Provider:   Sande Rives, PA-C or Almyra Deforest, PA-C   or Evalina Field, MD

## 2021-02-27 LAB — BASIC METABOLIC PANEL
BUN/Creatinine Ratio: 17 (ref 10–24)
BUN: 16 mg/dL (ref 8–27)
CO2: 28 mmol/L (ref 20–29)
Calcium: 9.4 mg/dL (ref 8.6–10.2)
Chloride: 102 mmol/L (ref 96–106)
Creatinine, Ser: 0.93 mg/dL (ref 0.76–1.27)
Glucose: 139 mg/dL — ABNORMAL HIGH (ref 70–99)
Potassium: 3.8 mmol/L (ref 3.5–5.2)
Sodium: 146 mmol/L — ABNORMAL HIGH (ref 134–144)
eGFR: 86 mL/min/{1.73_m2} (ref >59)

## 2021-03-05 ENCOUNTER — Encounter: Payer: Self-pay | Admitting: Family

## 2021-03-05 ENCOUNTER — Ambulatory Visit (INDEPENDENT_AMBULATORY_CARE_PROVIDER_SITE_OTHER): Payer: Medicaid Other | Admitting: Family

## 2021-03-05 ENCOUNTER — Other Ambulatory Visit: Payer: Self-pay

## 2021-03-05 VITALS — BP 125/69 | HR 96 | Temp 97.6°F | Ht 66.0 in | Wt 163.4 lb

## 2021-03-05 DIAGNOSIS — E785 Hyperlipidemia, unspecified: Secondary | ICD-10-CM

## 2021-03-05 DIAGNOSIS — N401 Enlarged prostate with lower urinary tract symptoms: Secondary | ICD-10-CM

## 2021-03-05 DIAGNOSIS — J449 Chronic obstructive pulmonary disease, unspecified: Secondary | ICD-10-CM | POA: Diagnosis not present

## 2021-03-05 DIAGNOSIS — J40 Bronchitis, not specified as acute or chronic: Secondary | ICD-10-CM

## 2021-03-05 DIAGNOSIS — N138 Other obstructive and reflux uropathy: Secondary | ICD-10-CM

## 2021-03-05 DIAGNOSIS — I5032 Chronic diastolic (congestive) heart failure: Secondary | ICD-10-CM

## 2021-03-05 DIAGNOSIS — C3491 Malignant neoplasm of unspecified part of right bronchus or lung: Secondary | ICD-10-CM

## 2021-03-05 DIAGNOSIS — E1142 Type 2 diabetes mellitus with diabetic polyneuropathy: Secondary | ICD-10-CM | POA: Diagnosis not present

## 2021-03-05 DIAGNOSIS — F5101 Primary insomnia: Secondary | ICD-10-CM | POA: Diagnosis not present

## 2021-03-05 DIAGNOSIS — I739 Peripheral vascular disease, unspecified: Secondary | ICD-10-CM | POA: Diagnosis not present

## 2021-03-05 DIAGNOSIS — Z720 Tobacco use: Secondary | ICD-10-CM

## 2021-03-05 DIAGNOSIS — M159 Polyosteoarthritis, unspecified: Secondary | ICD-10-CM

## 2021-03-05 DIAGNOSIS — F17210 Nicotine dependence, cigarettes, uncomplicated: Secondary | ICD-10-CM

## 2021-03-05 DIAGNOSIS — E039 Hypothyroidism, unspecified: Secondary | ICD-10-CM

## 2021-03-05 DIAGNOSIS — I10 Essential (primary) hypertension: Secondary | ICD-10-CM | POA: Diagnosis not present

## 2021-03-05 DIAGNOSIS — E1169 Type 2 diabetes mellitus with other specified complication: Secondary | ICD-10-CM | POA: Diagnosis not present

## 2021-03-05 DIAGNOSIS — Z1211 Encounter for screening for malignant neoplasm of colon: Secondary | ICD-10-CM

## 2021-03-05 DIAGNOSIS — K5900 Constipation, unspecified: Secondary | ICD-10-CM

## 2021-03-05 LAB — BAYER DCA HB A1C WAIVED: HB A1C (BAYER DCA - WAIVED): 5.7 % — ABNORMAL HIGH (ref 4.8–5.6)

## 2021-03-05 MED ORDER — ALBUTEROL SULFATE HFA 108 (90 BASE) MCG/ACT IN AERS: 2.0000 | INHALATION_SPRAY | RESPIRATORY_TRACT | 6 refills | Status: DC | PRN

## 2021-03-05 NOTE — Patient Instructions (Signed)

## 2021-03-05 NOTE — Progress Notes (Signed)
Subjective:    Patient ID: John [name], male    DOB: 31-Oct-1945, 75 y.o.   MRN: [id number]  Chief Complaint  Patient presents with   Medical Management of Chronic Issues    Pt presents to the office today for  chronic care follow up. He is followed by Cardiologists  for CHF. Followed by Oncologists every 6 months for non-small lung cancer. He completed radiation.  He is followed by Pulmonologist every 6 months for COPD. He is followed by Vascular for PAD.    He is followed by Urologists for BPH and Prostate.   Hypertension This is a chronic problem. The current episode started more than 1 year ago. The problem has been resolved since onset. The problem is controlled. Associated symptoms include malaise/fatigue and shortness of breath. Pertinent negatives include no blurred vision or peripheral edema. Risk factors for coronary artery disease include dyslipidemia, obesity and male gender. The current treatment provides moderate improvement. Identifiable causes of hypertension include a thyroid problem.  Congestive Heart Failure Presents for follow-up visit. Associated symptoms include fatigue, nocturia and shortness of breath. Pertinent negatives include no edema. The symptoms have been stable.  Thyroid Problem Presents for follow-up visit. Symptoms include constipation, dry skin and fatigue. Patient reports no hoarse voice. His past medical history is significant for hyperlipidemia.  Hyperlipidemia This is a chronic problem. The current episode started more than 1 year ago. Exacerbating diseases include obesity. Associated symptoms include shortness of breath. Current antihyperlipidemic treatment includes statins. The current treatment provides moderate improvement of lipids. Risk factors for coronary artery disease include dyslipidemia, diabetes mellitus, hypertension, male sex and a sedentary lifestyle.  Diabetes He presents for his follow-up diabetic visit. He has type 2 diabetes  mellitus. There are no hypoglycemic associated symptoms. Associated symptoms include fatigue and foot paresthesias. Pertinent negatives for diabetes include no blurred vision. Symptoms are stable. Diabetic complications include heart disease and peripheral neuropathy. Risk factors for coronary artery disease include dyslipidemia, diabetes mellitus, hypertension, male sex and sedentary lifestyle. He is following a generally healthy diet. His overall blood glucose range is 130-140 mg/dl. An ACE inhibitor/angiotensin II receptor blocker is being taken. Eye exam is current.  Arthritis Presents for follow-up visit. He complains of pain and stiffness. The symptoms have been stable. Affected locations include the right MCP, left MCP, right knee and left knee. His pain is at a severity of 7/10. Associated symptoms include fatigue.  Benign Prostatic Hypertrophy This is a chronic problem. The current episode started more than 1 year ago. Irritative symptoms include nocturia and urgency.  Nicotine Dependence Presents for follow-up visit. Symptoms include fatigue. His urge triggers include company of smokers. The symptoms have been stable. He smokes < 1/2 a pack of cigarettes per day.  Constipation This is a chronic problem. The current episode started more than 1 year ago. The problem has been resolved since onset. He has tried laxatives for the symptoms. The treatment provided moderate relief.     Review of Systems  Constitutional:  Positive for fatigue and malaise/fatigue.  HENT:  Negative for hoarse voice.   Eyes:  Negative for blurred vision.  Respiratory:  Positive for shortness of breath.   Gastrointestinal:  Positive for constipation.  Genitourinary:  Positive for nocturia and urgency.  Musculoskeletal:  Positive for arthritis and stiffness.  All other systems reviewed and are negative.     Objective:   Physical Exam Vitals reviewed.  Constitutional:      General: He is not in  acute  distress.    Appearance: He is well-developed. He is obese.  HENT:     Head: Normocephalic.     Right Ear: Tympanic membrane normal.     Left Ear: Tympanic membrane normal.  Eyes:     General:        Right eye: No discharge.        Left eye: No discharge.     Pupils: Pupils are equal, round, and reactive to light.  Neck:     Thyroid: No thyromegaly.  Cardiovascular:     Rate and Rhythm: Normal rate and regular rhythm.     Heart sounds: Normal heart sounds. No murmur heard. Pulmonary:     Effort: Pulmonary effort is normal. No respiratory distress.     Breath sounds: Normal breath sounds. No wheezing.  Abdominal:     General: Bowel sounds are normal. There is no distension.     Palpations: Abdomen is soft.     Tenderness: There is no abdominal tenderness.  Musculoskeletal:        General: No tenderness. Normal range of motion.     Cervical back: Normal range of motion and neck supple.  Skin:    General: Skin is warm and dry.     Findings: No erythema or rash.  Neurological:     Mental Status: He is alert and oriented to person, place, and time.     Cranial Nerves: No cranial nerve deficit.     Deep Tendon Reflexes: Reflexes are normal and symmetric.  Psychiatric:        Behavior: Behavior normal.        Thought Content: Thought content normal.        Judgment: Judgment normal.      BP 125/69   Pulse 96   Temp 97.6 F (36.4 C) (Temporal)   Ht 5\' 6"  (1.676 m)   Wt 163 lb 6.4 oz (74.1 kg)   BMI 26.37 kg/m      Assessment & Plan:  John [name] comes in today with chief complaint of Medical Management of Chronic Issues   Diagnosis and orders addressed:  1. Essential hypertension  2. Chronic obstructive pulmonary disease, unspecified COPD type (HCC) - albuterol (VENTOLIN HFA) 108 (90 Base) MCG/ACT inhaler; Inhale 2 puffs into the lungs every 4 (four) hours as needed for wheezing or shortness of breath.  Dispense: 8 g; Refill: 6  3. BPH with urinary  obstruction  4. Non-small cell lung cancer, right ([place])  5. Chronic diastolic CHF (congestive heart failure) ([place])  6. Type 2 diabetes mellitus with diabetic polyneuropathy, without long-term current use of insulin (HCC) - Bayer DCA Hb A1c Waived  7. Hypothyroidism, unspecified type  8. Diabetic polyneuropathy associated with type 2 diabetes mellitus ([place])  9. Cigarette smoker  10. Hyperlipidemia associated with type 2 diabetes mellitus ([place])  11. Primary insomnia  12. Primary osteoarthritis involving multiple joints  13. PAD (peripheral artery disease) ([place])  14. COPD GOLD III/ group D still smoking   15. Peripheral arterial disease ([place])  16. Constipation, unspecified constipation type  17. Bronchitis due to tobacco use  18. Colon cancer screening - Cologuard   Labs reviewed from Cardiologists and Oncologists  Health Maintenance reviewed Diet and exercise encouraged  Follow up plan: 3 months    Evelina Dun, FNP

## 2021-03-07 ENCOUNTER — Other Ambulatory Visit: Payer: Self-pay | Admitting: Family

## 2021-03-07 MED ORDER — RYBELSUS 3 MG PO TABS: 3.0000 mg | ORAL_TABLET | Freq: Every day | ORAL | 1 refills | Status: DC

## 2021-03-10 DIAGNOSIS — U071 COVID-19: Secondary | ICD-10-CM | POA: Diagnosis not present

## 2021-03-10 DIAGNOSIS — Z1211 Encounter for screening for malignant neoplasm of colon: Secondary | ICD-10-CM | POA: Diagnosis not present

## 2021-03-11 ENCOUNTER — Telehealth: Payer: Self-pay | Admitting: Family

## 2021-03-11 NOTE — Telephone Encounter (Signed)
Pt says that this was discussed at visit on 03/05/21 and if he had not heard anything to call back and check on it. He said this was for his legs swelling and that his sugars were running high some and needed to have this checked-up on for a couple of weeks Please advise

## 2021-03-12 DIAGNOSIS — S90822D Blister (nonthermal), left foot, subsequent encounter: Secondary | ICD-10-CM | POA: Diagnosis not present

## 2021-03-12 NOTE — Telephone Encounter (Signed)
Home Health ordered

## 2021-03-12 NOTE — Addendum Note (Signed)
Addended by: Evelina Dun A on: 03/12/2021 01:58 PM   Modules accepted: Orders

## 2021-03-13 ENCOUNTER — Other Ambulatory Visit: Payer: Self-pay | Admitting: Family

## 2021-03-13 DIAGNOSIS — E1142 Type 2 diabetes mellitus with diabetic polyneuropathy: Secondary | ICD-10-CM

## 2021-03-15 LAB — COLOGUARD: COLOGUARD: POSITIVE — AB

## 2021-03-18 ENCOUNTER — Inpatient Hospital Stay (HOSPITAL_COMMUNITY): Payer: Medicare Other

## 2021-03-19 ENCOUNTER — Ambulatory Visit (HOSPITAL_COMMUNITY): Payer: Medicare Other

## 2021-03-19 ENCOUNTER — Other Ambulatory Visit: Payer: Self-pay | Admitting: Family

## 2021-03-19 ENCOUNTER — Ambulatory Visit: Payer: Medicare Other

## 2021-03-19 DIAGNOSIS — R195 Other fecal abnormalities: Secondary | ICD-10-CM

## 2021-03-20 ENCOUNTER — Ambulatory Visit (INDEPENDENT_AMBULATORY_CARE_PROVIDER_SITE_OTHER): Payer: Medicare Other | Admitting: Family Medicine

## 2021-03-20 ENCOUNTER — Telehealth: Payer: Self-pay | Admitting: Family

## 2021-03-20 DIAGNOSIS — U071 COVID-19: Secondary | ICD-10-CM

## 2021-03-20 MED ORDER — MOLNUPIRAVIR EUA 200MG CAPSULE: 4.0000 | ORAL_CAPSULE | Freq: Two times a day (BID) | ORAL | 0 refills | Status: AC

## 2021-03-20 NOTE — Progress Notes (Signed)
Telephone visit  Subjective: CC: COVID19 PCP: [name], FNP [id number] John [name] is a 75 y.o. male calls for telephone consult today. Patient provides verbal consent for consult held via phone.  Due to COVID-19 pandemic this visit was conducted virtually. This visit type was conducted due to national recommendations for restrictions regarding the COVID-19 Pandemic (e.g. social distancing, sheltering in place) in an effort to limit this patient's exposure and mitigate transmission in our community. All issues noted in this document were discussed and addressed.  A physical exam was not performed with this format.   Location of patient: home Location of provider: [facility] Others present for call: none  1. COVID-19 Patient reports that [facility] nurse came by and tested him for [place].  He denies cough.  He reports chills. No fever, nausea, vomiting.  He has chronic GI issues.  No problems keeping fluid down.   ROS: Per HPI  Allergies  Allergen Reactions   Sulfa Antibiotics Swelling    Mouth swelling   Atorvastatin Other (See Comments)    Muscle aches - tolerating Pravastatin 40 mg MWF   Jardiance [Empagliflozin] Other (See Comments)    FEELS SLUGGISH, TIRED   Lopressor [Metoprolol] Other (See Comments)    Fatigue   Rosuvastatin Other (See Comments)    Muscle aches - tolerating Pravastatin 40 mg MWF   Temazepam Other (See Comments)    Made insomnia worse    Past Medical History:  Diagnosis Date   Adenocarcinoma of lung, right ([place]) 04/18/2016   Anxiety    Arthritis    Asthma    BPH (benign prostatic hyperplasia)    with urinary retention 02/06/20   CHF (congestive heart failure) (HCC)    COPD (chronic obstructive pulmonary disease) (HCC)    Depression    Diabetes mellitus without complication (HCC)    no meds   Diabetes mellitus, type II (HCC)    DM (diabetes mellitus) ([place]) 07/09/2016   Dyspnea    History of kidney stones    History of radiation therapy 06/25/16-08/20/16    right lung   History of radiation therapy 09/17/2020   right lung 09/10/2020-09/17/2020   Dr Sondra Come   Hyperlipidemia    Hypertension    Hypertension    Hypothyroidism    Macular degeneration    Neuropathy    Non-small cell lung cancer, right ([place]) 04/18/2016   Peripheral vascular disease ([place])    Prostatitis    Pulmonary nodule, left 07/16/2016   Sleep apnea    cpap    Current Outpatient Medications:    albuterol (VENTOLIN HFA) 108 (90 Base) MCG/ACT inhaler, Inhale 2 puffs into the lungs every 4 (four) hours as needed for wheezing or shortness of breath., Disp: 8 g, Rfl: 6   apixaban (ELIQUIS) 5 MG TABS tablet, Take 1 tablet (5 mg total) by mouth 2 (two) times daily., Disp: 60 tablet, Rfl: 3   aspirin EC 81 MG tablet, Take 1 tablet (81 mg total) by mouth daily. Swallow whole., Disp: 90 tablet, Rfl: 3   Blood Glucose Monitoring Suppl (ONETOUCH VERIO REFLECT) w/Device KIT, Use to test blood sugars daily as directed. DX: E11.9, Disp: 1 kit, Rfl: 1   ezetimibe (ZETIA) 10 MG tablet, Take 1 tablet (10 mg total) by mouth daily., Disp: 90 tablet, Rfl: 3   fluticasone (FLONASE) 50 MCG/ACT nasal spray, Place 1 spray into both nostrils daily as needed for allergies or rhinitis., Disp: 18.2 mL, Rfl: 2   Fluticasone-Umeclidin-Vilant (TRELEGY ELLIPTA) 100-62.5-25 MCG/INH AEPB, Inhale  1 puff into the lungs daily., Disp: 1 each, Rfl: 11   gabapentin (NEURONTIN) 300 MG capsule, TAKE 1 CAPSULE BY MOUTH THREE TIMES A DAY, Disp: 270 capsule, Rfl: 0   hydrOXYzine (VISTARIL) 25 MG capsule, Take 1 capsule (25 mg total) by mouth 3 (three) times daily as needed for itching. (Patient taking differently: Take 25 mg by mouth as needed for itching.), Disp: 60 capsule, Rfl: 1   Iron, Ferrous Sulfate, 325 (65 Fe) MG TABS, Take 325 mg by mouth 2 (two) times daily., Disp: 180 tablet, Rfl: 1   KLOR-CON M20 20 MEQ tablet, TAKE 1 TABLET BY MOUTH EVERY DAY, Disp: 90 tablet, Rfl: 0   levocetirizine (XYZAL) 5 MG tablet, Take  1 tablet (5 mg total) by mouth at bedtime., Disp: 30 tablet, Rfl: 2   levothyroxine (SYNTHROID) 50 MCG tablet, Take 1 tablet (50 mcg total) by mouth daily., Disp: 90 tablet, Rfl: 1   linaclotide (LINZESS) 145 MCG CAPS capsule, Take 1 capsule (145 mcg total) by mouth daily. To regulate bowel movements, Disp: 30 capsule, Rfl: 5   nebivolol (BYSTOLIC) 2.5 MG tablet, Take 1 tablet (2.5 mg total) by mouth daily., Disp: 30 tablet, Rfl: 6   OneTouch Delica Lancets 00F MISC, Use to test blood sugars daily as directed. DX: E11.9, Disp: 100 each, Rfl: 1   ONETOUCH VERIO test strip, USE TO TEST BLOOD SUGARS DAILY AS DIRECTED. DX: E11.9, Disp: 100 strip, Rfl: 12   pantoprazole (PROTONIX) 40 MG tablet, Take 1 tablet (40 mg total) by mouth daily., Disp: 90 tablet, Rfl: 1   pravastatin (PRAVACHOL) 40 MG tablet, TAKE 1 TABLET (40 MG TOTAL) BY MOUTH ON MONDAYS , WEDNESDAYS, AND FRIDAYS AS DIRECTED, Disp: 48 tablet, Rfl: 3   Semaglutide (RYBELSUS) 3 MG TABS, Take 3 mg by mouth daily., Disp: 90 tablet, Rfl: 1   Torsemide 40 MG TABS, Take 40 mg by mouth daily. Can take an additional 30m in the afternoon as needed for weight gain (3lbs in 1 day or 5lbs in 1 week) or worsening lower extremity edema or shortness of breath., Disp: 180 tablet, Rfl: 3   triamcinolone cream (KENALOG) 0.1 %, Apply 1 application topically 2 (two) times daily., Disp: 80 g, Rfl: 1   zolpidem (AMBIEN) 5 MG tablet, Take 1 tablet (5 mg total) by mouth at bedtime as needed for sleep., Disp: 30 tablet, Rfl: 2  Assessment/ Plan: 75y.o. male   COVID-19 - Plan: molnupiravir EUA (LAGEVRIO) 200 mg CAPS capsule  Treat for COVID.  Home care instructions reviewed.  Red flags discussed. Follow up prn.  Start time: 4:04pm End time: 4:13pm  Total time spent on patient care (including telephone call/ virtual visit): 9 minutes  John [name]([phone]

## 2021-03-24 ENCOUNTER — Other Ambulatory Visit: Payer: Self-pay | Admitting: Cardiovascular Disease

## 2021-03-25 ENCOUNTER — Ambulatory Visit (HOSPITAL_COMMUNITY): Payer: Medicare Other | Admitting: Hematology

## 2021-03-25 ENCOUNTER — Encounter (HOSPITAL_COMMUNITY): Payer: Medicare Other

## 2021-03-25 NOTE — Telephone Encounter (Signed)
Ok

## 2021-03-26 ENCOUNTER — Encounter: Payer: Self-pay | Admitting: Physician Assistant

## 2021-03-26 ENCOUNTER — Ambulatory Visit (HOSPITAL_COMMUNITY)
Admission: RE | Admit: 2021-03-26 | Discharge: 2021-03-26 | Disposition: A | Discharge status: Home / Self Care | Payer: Medicare Other | Source: Ambulatory Visit | Attending: Physician Assistant | Admitting: Physician Assistant

## 2021-03-26 ENCOUNTER — Ambulatory Visit (INDEPENDENT_AMBULATORY_CARE_PROVIDER_SITE_OTHER): Payer: Medicare Other | Admitting: Physician Assistant

## 2021-03-26 ENCOUNTER — Other Ambulatory Visit: Payer: Self-pay

## 2021-03-26 VITALS — BP 139/75 | HR 89 | Temp 97.8°F | Resp 20 | Ht 66.0 in | Wt 159.0 lb

## 2021-03-26 DIAGNOSIS — I2510 Atherosclerotic heart disease of native coronary artery without angina pectoris: Secondary | ICD-10-CM

## 2021-03-26 DIAGNOSIS — I739 Peripheral vascular disease, unspecified: Secondary | ICD-10-CM | POA: Diagnosis not present

## 2021-03-26 NOTE — Progress Notes (Signed)
HISTORY AND PHYSICAL     CC:  follow up. Requesting Provider:  [name], FNP  HPI: This is a 75 y.o. male who is here today for follow up for PAD.  He has hx of COPD, HTN, HLD, DM, PAD who initially presented as a referral from Dr. Gwenlyn Found for evaluation of right femoral endarterectomy.  Ultimately patient endorsed several years of claudication in the right lower extremity.  He got severe burning in the right thigh after only walking about 20 to 25 feet.  He underwent arteriogram with Dr. Gwenlyn Found on 02/06/2020 that showed a 95% right common iliac stenosis as well as a 90% right common femoral stenosis and a high-grade profunda stenosis and also high grade right mid SFA stenosis.  On Aug 20, 2020, he underwent: 1.  Intravascular ultrasound (IVUS) of right external iliac artery, right common iliac artery, and infrarenal aorta 2.  Right iliac arteriogram 3.  Angioplasty and stent of right common iliac artery (8 mm x 29 mm VBX) 4.  Right common femoral endarterectomy with profundoplasty and bovine pericardial patch angioplasty By Dr. Carlis Abbott  He was originally scheduled for surgery earlier in 2022 but was hospitalized with UTI sepsis and had chronic indwelling foley catheter.  He underwent TURP February 2022.   Pt was last seen July 2022 and at that time, he was slow to heal his groin incision.  He leg was better but felt it gave out on him sometimes.  He was scheduled for a 6 week follow up but that did not happen.  The pt returns today for follow up studies.  He states that his incision finally healed but his underwear still irritates the incision and he sometimes puts a band aid on it.  He states he gets some burning in his feet at night that will sometimes takes his breath more so in the right than left but denies any severe pain.  He states that he will get some cramping in his right buttock when he sometimes even walks from the living room to the kitchen.  He states it gets better with rest  and sometimes will take a muscle relaxer to help with this.  He states it does not happen all the time.  He denies any wounds on his feet.   He states he had a positive cologuard and is waiting for referral to specialist.    The pt is on a statin for cholesterol management.    The pt is on an aspirin.    Other AC:  Eliquis The pt is on BB for hypertension.  The pt does have diabetes. Tobacco hx:  current  Pt does not have family hx of AAA.  He has been with his significant other [name] for over 20 years.   Past Medical History:  Diagnosis Date   Adenocarcinoma of lung, right ([place]) 04/18/2016   Anxiety    Arthritis    Asthma    BPH (benign prostatic hyperplasia)    with urinary retention 02/06/20   CHF (congestive heart failure) ([facility])    COPD (chronic obstructive pulmonary disease) ([place])    Depression    Diabetes mellitus without complication ([place])    no meds   Diabetes mellitus, type II ([place])    DM (diabetes mellitus) ([place]) 07/09/2016   Dyspnea    History of kidney stones    History of radiation therapy 06/25/16-08/20/16   right lung   History of radiation therapy 09/17/2020   right lung 09/10/2020-09/17/2020  Dr Sondra Come   Hyperlipidemia    Hypertension    Hypertension    Hypothyroidism    Macular degeneration    Neuropathy    Non-small cell lung cancer, right ([place]) 04/18/2016   Peripheral vascular disease ([place])    Prostatitis    Pulmonary nodule, left 07/16/2016   Sleep apnea    cpap    Past Surgical History:  Procedure Laterality Date   ABDOMINAL AORTOGRAM W/LOWER EXTREMITY Left 02/06/2020   Procedure: ABDOMINAL AORTOGRAM W/LOWER EXTREMITY;  Surgeon: Lorretta Harp, MD;  Location: [facility];  Service: Cardiovascular;  Laterality: Left;   CARDIOVERSION N/A 10/05/2020   Procedure: CARDIOVERSION;  Surgeon: Geralynn Rile, MD;  Location: [facility];  Service: Cardiovascular;  Laterality: N/A;   CATARACT EXTRACTION, BILATERAL Bilateral    ENDARTERECTOMY  FEMORAL Right 08/20/2020   Procedure: ENDARTERECTOMY  RIGHT FEMORAL ARTERY;  Surgeon: Marty Heck, MD;  Location: [facility];  Service: Vascular;  Laterality: Right;   ESOPHAGOGASTRODUODENOSCOPY (EGD) WITH PROPOFOL N/A 09/06/2020   Procedure: ESOPHAGOGASTRODUODENOSCOPY (EGD) WITH PROPOFOL;  Surgeon: Irene Shipper, MD;  Location: [facility];  Service: Endoscopy;  Laterality: N/A;   INSERTION OF ILIAC STENT Right 08/20/2020   Procedure: RETROGRADE INSERTION OF RIGHT ILIAC STENT;  Surgeon: Marty Heck, MD;  Location: [facility];  Service: Vascular;  Laterality: Right;   INTRAOPERATIVE ARTERIOGRAM Right 08/20/2020   Procedure: INTRA OPERATIVE ARTERIOGRAM ILIAC;  Surgeon: Marty Heck, MD;  Location: [facility];  Service: Vascular;  Laterality: Right;   PATCH ANGIOPLASTY Right 08/20/2020   Procedure: PATCH ANGIOPLASTY RIGHT FEMORAL ARTERY;  Surgeon: Marty Heck, MD;  Location: [facility];  Service: Vascular;  Laterality: Right;   PORTACATH PLACEMENT Left 06/13/2016   Procedure: INSERTION PORT-A-CATH;  Surgeon: Aviva Signs, MD;  Location: [facility];  Service: General;  Laterality: Left;   TRANSURETHRAL RESECTION OF PROSTATE N/A 05/31/2020   Procedure: TRANSURETHRAL RESECTION OF THE PROSTATE (TURP);  Surgeon: Cleon Gustin, MD;  Location: [facility];  Service: Urology;  Laterality: N/A;   VIDEO BRONCHOSCOPY WITH ENDOBRONCHIAL NAVIGATION N/A 05/28/2016   Procedure: VIDEO BRONCHOSCOPY WITH ENDOBRONCHIAL NAVIGATION;  Surgeon: [name], MD;  Location: [facility];  Service: Thoracic;  Laterality: N/A;   VIDEO BRONCHOSCOPY WITH ENDOBRONCHIAL ULTRASOUND N/A 05/28/2016   Procedure: VIDEO BRONCHOSCOPY WITH ENDOBRONCHIAL ULTRASOUND;  Surgeon: [name], MD;  Location: [facility];  Service: Thoracic;  Laterality: N/A;    Allergies  Allergen Reactions   Sulfa Antibiotics Swelling    Mouth swelling   Atorvastatin Other (See Comments)    Muscle aches - tolerating Pravastatin 40 mg MWF    Jardiance [Empagliflozin] Other (See Comments)    FEELS SLUGGISH, TIRED   Lopressor [Metoprolol] Other (See Comments)    Fatigue   Rosuvastatin Other (See Comments)    Muscle aches - tolerating Pravastatin 40 mg MWF   Temazepam Other (See Comments)    Made insomnia worse     Current Outpatient Medications  Medication Sig Dispense Refill   albuterol (VENTOLIN HFA) 108 (90 Base) MCG/ACT inhaler Inhale 2 puffs into the lungs every 4 (four) hours as needed for wheezing or shortness of breath. 8 g 6   apixaban (ELIQUIS) 5 MG TABS tablet Take 1 tablet (5 mg total) by mouth 2 (two) times daily. 60 tablet 3   aspirin EC 81 MG tablet Take 1 tablet (81 mg total) by mouth daily. Swallow whole. 90 tablet 3   Blood Glucose Monitoring Suppl (ONETOUCH VERIO REFLECT) w/Device  KIT Use to test blood sugars daily as directed. DX: E11.9 1 kit 1   ezetimibe (ZETIA) 10 MG tablet Take 1 tablet (10 mg total) by mouth daily. 90 tablet 3   fluticasone (FLONASE) 50 MCG/ACT nasal spray Place 1 spray into both nostrils daily as needed for allergies or rhinitis. 18.2 mL 2   Fluticasone-Umeclidin-Vilant (TRELEGY ELLIPTA) 100-62.5-25 MCG/INH AEPB Inhale 1 puff into the lungs daily. 1 each 11   gabapentin (NEURONTIN) 300 MG capsule TAKE 1 CAPSULE BY MOUTH THREE TIMES A DAY 270 capsule 0   hydrOXYzine (VISTARIL) 25 MG capsule Take 1 capsule (25 mg total) by mouth 3 (three) times daily as needed for itching. (Patient taking differently: Take 25 mg by mouth as needed for itching.) 60 capsule 1   Iron, Ferrous Sulfate, 325 (65 Fe) MG TABS Take 325 mg by mouth 2 (two) times daily. 180 tablet 1   KLOR-CON M20 20 MEQ tablet TAKE 1 TABLET BY MOUTH EVERY DAY 90 tablet 0   levocetirizine (XYZAL) 5 MG tablet Take 1 tablet (5 mg total) by mouth at bedtime. 30 tablet 2   levothyroxine (SYNTHROID) 50 MCG tablet Take 1 tablet (50 mcg total) by mouth daily. 90 tablet 1   linaclotide (LINZESS) 145 MCG CAPS capsule Take 1 capsule (145 mcg  total) by mouth daily. To regulate bowel movements 30 capsule 5   nebivolol (BYSTOLIC) 2.5 MG tablet Take 1 tablet (2.5 mg total) by mouth daily. 30 tablet 6   OneTouch Delica Lancets 38O MISC Use to test blood sugars daily as directed. DX: E11.9 100 each 1   ONETOUCH VERIO test strip USE TO TEST BLOOD SUGARS DAILY AS DIRECTED. DX: E11.9 100 strip 12   pantoprazole (PROTONIX) 40 MG tablet Take 1 tablet (40 mg total) by mouth daily. 90 tablet 1   pravastatin (PRAVACHOL) 40 MG tablet TAKE 1 TABLET (40 MG TOTAL) BY MOUTH ON MONDAYS , WEDNESDAYS, AND FRIDAYS AS DIRECTED 48 tablet 3   Semaglutide (RYBELSUS) 3 MG TABS Take 3 mg by mouth daily. 90 tablet 1   Torsemide 40 MG TABS Take 40 mg by mouth daily. Can take an additional 73m in the afternoon as needed for weight gain (3lbs in 1 day or 5lbs in 1 week) or worsening lower extremity edema or shortness of breath. 180 tablet 3   triamcinolone cream (KENALOG) 0.1 % Apply 1 application topically 2 (two) times daily. 80 g 1   zolpidem (AMBIEN) 5 MG tablet Take 1 tablet (5 mg total) by mouth at bedtime as needed for sleep. 30 tablet 2   No current facility-administered medications for this visit.    Family History  Problem Relation Age of Onset   Hypertension Mother    Diabetes Father    Heart disease Father    Stroke Father    Hypertension Sister     Social History   Socioeconomic History   Marital status: DSoil scientist   Spouse name: Not on file   Number of children: 5   Years of education: 10   Highest education level: 10th grade  Occupational History   Occupation: Retired  Tobacco Use   Smoking status: Every Day    Packs/day: 0.50    Years: 55.00    Pack years: 27.50    Types: Cigarettes    Start date: 03/11/1961   Smokeless tobacco: Never   Tobacco comments:    1/2 pack to 1 pack per day 12/14/19  Vaping Use   Vaping Use:  Never used  Substance and Sexual Activity   Alcohol use: No   Drug use: No   Sexual activity: Yes     Birth control/protection: None  Other Topics Concern   Not on file  Social History Narrative   [name] is his POA and lives with him. Children out of town - one in [facility], others in [place].   Social Determinants of Health   Financial Resource Strain: Low Risk    Difficulty of Paying Living Expenses: Not hard at all  Food Insecurity: No Food Insecurity   Worried About Charity fundraiser in the Last Year: Never true   [place] in the Last Year: Never true  Transportation Needs: No Transportation Needs   Lack of Transportation (Medical): No   Lack of Transportation (Non-Medical): No  Physical Activity: Unknown   Days of Exercise per Week: 7 days   Minutes of Exercise per Session: Not on file  Stress: No Stress Concern Present   Feeling of Stress : Not at all  Social Connections: Moderately Isolated   Frequency of Communication with Friends and Family: More than three times a week   Frequency of Social Gatherings with Friends and Family: Once a week   Attends Religious Services: Never   Marine scientist or Organizations: No   Attends Music therapist: Never   Marital Status: Living with partner  Intimate Partner Violence: Not At Risk   Fear of Current or Ex-Partner: No   Emotionally Abused: No   Physically Abused: No   Sexually Abused: No     REVIEW OF SYSTEMS:   [X] denotes positive finding, [ ] denotes negative finding Cardiac  Comments:  Chest pain or chest pressure:    Shortness of breath upon exertion: x   Short of breath when lying flat:    Irregular heart rhythm:        Vascular    Pain in calf, thigh, or hip brought on by ambulation: x   Pain in feet at night that wakes you up from your sleep:     Blood clot in your veins:    Leg swelling:         Pulmonary    Oxygen at home:    Productive cough:     Wheezing:         Neurologic    Sudden weakness in arms or legs:     Sudden numbness in arms or legs:     Sudden onset of  difficulty speaking or slurred speech:    Temporary loss of vision in one eye:     Problems with dizziness:         Gastrointestinal    Blood in stool:     Vomited blood:         Genitourinary    Burning when urinating:     Blood in urine:        Psychiatric    Major depression:         Hematologic    Bleeding problems:    Problems with blood clotting too easily:        Skin    Rashes or ulcers:        Constitutional    Fever or chills:      PHYSICAL EXAMINATION:  Today's Vitals   03/26/21 1424  BP: 139/75  Pulse: 89  Resp: 20  Temp: 97.8 F (36.6 C)  SpO2: 100%  Weight: 159 lb (72.1  kg)  Height: 5' 6" (1.676 m)  PainSc: 4    Body mass index is 25.66 kg/m.   General:  WDWN in NAD; vital signs documented above Gait: Not observed HENT: WNL, normocephalic Pulmonary: normal non-labored breathing , without wheezing Cardiac: regular HR, without carotid bruits Abdomen: soft, NT, no masses; aortic pulse is not palpable Skin: without rashes Vascular Exam/Pulses:  Right Left  Radial 2+ (normal) 2+ (normal)  Femoral 1+ (weak) 1+ (weak)  DP monophasic monophasic  PT monophasic palpable  Peroneal monophasic monophasic   Extremities: without ischemic changes, without Gangrene , without cellulitis; without open wounds; right groin incision has completely healed. Musculoskeletal: no muscle wasting or atrophy  Neurologic: A&O X 3 Psychiatric:  The pt has Normal affect.   Non-Invasive Vascular Imaging:   ABI's/TBI's on 03/26/2021: Right:  0.67/absent - Great toe pressure: 0 Left:  0.85/0.57 - Great toe pressure: 78    ASSESSMENT/PLAN:: 75 y.o. male here for follow up for PAD and is s/p: 1.  Intravascular ultrasound (IVUS) of right external iliac artery, right common iliac artery, and infrarenal aorta 2.  Right iliac arteriogram 3.  Angioplasty and stent of right common iliac artery (8 mm x 29 mm VBX) 4.  Right common femoral endarterectomy with profundoplasty  and bovine pericardial patch angioplasty On 08/20/2020 by Dr. Carlis Abbott   -pt ABI today slightly decreased from July 2021 ABI.  He is getting what may be buttock claudication with short distance but not all the time.  He has a 1+ palpable right femoral pulse.  He does not have frank rest pain but what sounds like neuropathy in his feet (right more than left).  Discussed with pt about bringing him back in 6 weeks and duplex right ilac artery stent, RLE arterial duplex and ABI and see Dr. Carlis Abbott.  Pt and significant other are in agreement with this plan.   They will call sooner if he develops rest pain or non healing wounds.  -continue statin/asa   Leontine Locket, [facility] [phone]  Clinic MD:   Scot Dock on call MD

## 2021-03-27 ENCOUNTER — Telehealth: Payer: Self-pay | Admitting: Cardiovascular Disease

## 2021-03-27 ENCOUNTER — Other Ambulatory Visit: Payer: Self-pay | Admitting: Family

## 2021-03-27 ENCOUNTER — Other Ambulatory Visit: Payer: Self-pay

## 2021-03-27 DIAGNOSIS — D630 Anemia in neoplastic disease: Secondary | ICD-10-CM | POA: Diagnosis not present

## 2021-03-27 DIAGNOSIS — E785 Hyperlipidemia, unspecified: Secondary | ICD-10-CM | POA: Diagnosis not present

## 2021-03-27 DIAGNOSIS — J449 Chronic obstructive pulmonary disease, unspecified: Secondary | ICD-10-CM | POA: Diagnosis not present

## 2021-03-27 DIAGNOSIS — G4730 Sleep apnea, unspecified: Secondary | ICD-10-CM | POA: Diagnosis not present

## 2021-03-27 DIAGNOSIS — I4892 Unspecified atrial flutter: Secondary | ICD-10-CM | POA: Diagnosis not present

## 2021-03-27 DIAGNOSIS — C3490 Malignant neoplasm of unspecified part of unspecified bronchus or lung: Secondary | ICD-10-CM | POA: Diagnosis not present

## 2021-03-27 DIAGNOSIS — U071 COVID-19: Secondary | ICD-10-CM | POA: Diagnosis not present

## 2021-03-27 DIAGNOSIS — N401 Enlarged prostate with lower urinary tract symptoms: Secondary | ICD-10-CM | POA: Diagnosis not present

## 2021-03-27 DIAGNOSIS — I110 Hypertensive heart disease with heart failure: Secondary | ICD-10-CM | POA: Diagnosis not present

## 2021-03-27 DIAGNOSIS — N138 Other obstructive and reflux uropathy: Secondary | ICD-10-CM | POA: Diagnosis not present

## 2021-03-27 DIAGNOSIS — I5032 Chronic diastolic (congestive) heart failure: Secondary | ICD-10-CM | POA: Diagnosis not present

## 2021-03-27 DIAGNOSIS — R338 Other retention of urine: Secondary | ICD-10-CM | POA: Diagnosis not present

## 2021-03-27 DIAGNOSIS — F17210 Nicotine dependence, cigarettes, uncomplicated: Secondary | ICD-10-CM | POA: Diagnosis not present

## 2021-03-27 DIAGNOSIS — F5101 Primary insomnia: Secondary | ICD-10-CM | POA: Diagnosis not present

## 2021-03-27 DIAGNOSIS — E1169 Type 2 diabetes mellitus with other specified complication: Secondary | ICD-10-CM | POA: Diagnosis not present

## 2021-03-27 DIAGNOSIS — E1142 Type 2 diabetes mellitus with diabetic polyneuropathy: Secondary | ICD-10-CM | POA: Diagnosis not present

## 2021-03-27 DIAGNOSIS — H3530 Unspecified macular degeneration: Secondary | ICD-10-CM | POA: Diagnosis not present

## 2021-03-27 DIAGNOSIS — I739 Peripheral vascular disease, unspecified: Secondary | ICD-10-CM

## 2021-03-27 DIAGNOSIS — Z7901 Long term (current) use of anticoagulants: Secondary | ICD-10-CM | POA: Diagnosis not present

## 2021-03-27 DIAGNOSIS — E1151 Type 2 diabetes mellitus with diabetic peripheral angiopathy without gangrene: Secondary | ICD-10-CM | POA: Diagnosis not present

## 2021-03-27 DIAGNOSIS — K5900 Constipation, unspecified: Secondary | ICD-10-CM | POA: Diagnosis not present

## 2021-03-27 DIAGNOSIS — E039 Hypothyroidism, unspecified: Secondary | ICD-10-CM | POA: Diagnosis not present

## 2021-03-27 DIAGNOSIS — Z7902 Long term (current) use of antithrombotics/antiplatelets: Secondary | ICD-10-CM | POA: Diagnosis not present

## 2021-03-27 NOTE — Telephone Encounter (Signed)
Calling to have patient chart from 02/26/2021 fax over [phone]

## 2021-03-27 NOTE — Telephone Encounter (Signed)
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 They handle the oxygen and things of that matter. I asked if they have a release of information form. They are going to fax it to [phone].

## 2021-03-28 ENCOUNTER — Ambulatory Visit: Payer: Medicare Other | Admitting: Family

## 2021-03-28 ENCOUNTER — Encounter: Payer: Self-pay | Admitting: Family

## 2021-03-29 DIAGNOSIS — I5032 Chronic diastolic (congestive) heart failure: Secondary | ICD-10-CM | POA: Diagnosis not present

## 2021-03-29 DIAGNOSIS — E1151 Type 2 diabetes mellitus with diabetic peripheral angiopathy without gangrene: Secondary | ICD-10-CM | POA: Diagnosis not present

## 2021-03-29 DIAGNOSIS — U071 COVID-19: Secondary | ICD-10-CM | POA: Diagnosis not present

## 2021-03-29 DIAGNOSIS — J449 Chronic obstructive pulmonary disease, unspecified: Secondary | ICD-10-CM | POA: Diagnosis not present

## 2021-03-29 DIAGNOSIS — E1142 Type 2 diabetes mellitus with diabetic polyneuropathy: Secondary | ICD-10-CM | POA: Diagnosis not present

## 2021-03-29 DIAGNOSIS — I110 Hypertensive heart disease with heart failure: Secondary | ICD-10-CM | POA: Diagnosis not present

## 2021-04-02 DIAGNOSIS — I5032 Chronic diastolic (congestive) heart failure: Secondary | ICD-10-CM | POA: Diagnosis not present

## 2021-04-02 DIAGNOSIS — E1142 Type 2 diabetes mellitus with diabetic polyneuropathy: Secondary | ICD-10-CM | POA: Diagnosis not present

## 2021-04-02 DIAGNOSIS — U071 COVID-19: Secondary | ICD-10-CM | POA: Diagnosis not present

## 2021-04-02 DIAGNOSIS — J449 Chronic obstructive pulmonary disease, unspecified: Secondary | ICD-10-CM | POA: Diagnosis not present

## 2021-04-02 DIAGNOSIS — E1151 Type 2 diabetes mellitus with diabetic peripheral angiopathy without gangrene: Secondary | ICD-10-CM | POA: Diagnosis not present

## 2021-04-02 DIAGNOSIS — I110 Hypertensive heart disease with heart failure: Secondary | ICD-10-CM | POA: Diagnosis not present

## 2021-04-03 DIAGNOSIS — E1142 Type 2 diabetes mellitus with diabetic polyneuropathy: Secondary | ICD-10-CM | POA: Diagnosis not present

## 2021-04-03 DIAGNOSIS — E1151 Type 2 diabetes mellitus with diabetic peripheral angiopathy without gangrene: Secondary | ICD-10-CM | POA: Diagnosis not present

## 2021-04-03 DIAGNOSIS — I5032 Chronic diastolic (congestive) heart failure: Secondary | ICD-10-CM | POA: Diagnosis not present

## 2021-04-03 DIAGNOSIS — U071 COVID-19: Secondary | ICD-10-CM | POA: Diagnosis not present

## 2021-04-03 DIAGNOSIS — I110 Hypertensive heart disease with heart failure: Secondary | ICD-10-CM | POA: Diagnosis not present

## 2021-04-03 DIAGNOSIS — J449 Chronic obstructive pulmonary disease, unspecified: Secondary | ICD-10-CM | POA: Diagnosis not present

## 2021-04-03 NOTE — Telephone Encounter (Signed)
[facility] called back to check the status of the records request.

## 2021-04-04 DIAGNOSIS — J449 Chronic obstructive pulmonary disease, unspecified: Secondary | ICD-10-CM | POA: Diagnosis not present

## 2021-04-04 DIAGNOSIS — I5032 Chronic diastolic (congestive) heart failure: Secondary | ICD-10-CM | POA: Diagnosis not present

## 2021-04-04 DIAGNOSIS — E1151 Type 2 diabetes mellitus with diabetic peripheral angiopathy without gangrene: Secondary | ICD-10-CM | POA: Diagnosis not present

## 2021-04-04 DIAGNOSIS — I110 Hypertensive heart disease with heart failure: Secondary | ICD-10-CM | POA: Diagnosis not present

## 2021-04-04 DIAGNOSIS — E1142 Type 2 diabetes mellitus with diabetic polyneuropathy: Secondary | ICD-10-CM | POA: Diagnosis not present

## 2021-04-04 DIAGNOSIS — U071 COVID-19: Secondary | ICD-10-CM | POA: Diagnosis not present

## 2021-04-09 ENCOUNTER — Ambulatory Visit (INDEPENDENT_AMBULATORY_CARE_PROVIDER_SITE_OTHER): Payer: Medicare Other

## 2021-04-09 ENCOUNTER — Other Ambulatory Visit: Payer: Self-pay

## 2021-04-09 DIAGNOSIS — N138 Other obstructive and reflux uropathy: Secondary | ICD-10-CM

## 2021-04-09 DIAGNOSIS — Z8616 Personal history of COVID-19: Secondary | ICD-10-CM | POA: Diagnosis not present

## 2021-04-09 DIAGNOSIS — E039 Hypothyroidism, unspecified: Secondary | ICD-10-CM

## 2021-04-09 DIAGNOSIS — E1169 Type 2 diabetes mellitus with other specified complication: Secondary | ICD-10-CM | POA: Diagnosis not present

## 2021-04-09 DIAGNOSIS — G4730 Sleep apnea, unspecified: Secondary | ICD-10-CM

## 2021-04-09 DIAGNOSIS — E1151 Type 2 diabetes mellitus with diabetic peripheral angiopathy without gangrene: Secondary | ICD-10-CM

## 2021-04-09 DIAGNOSIS — C3490 Malignant neoplasm of unspecified part of unspecified bronchus or lung: Secondary | ICD-10-CM

## 2021-04-09 DIAGNOSIS — F5101 Primary insomnia: Secondary | ICD-10-CM

## 2021-04-09 DIAGNOSIS — D630 Anemia in neoplastic disease: Secondary | ICD-10-CM

## 2021-04-09 DIAGNOSIS — U071 COVID-19: Secondary | ICD-10-CM | POA: Diagnosis not present

## 2021-04-09 DIAGNOSIS — K5900 Constipation, unspecified: Secondary | ICD-10-CM

## 2021-04-09 DIAGNOSIS — I5032 Chronic diastolic (congestive) heart failure: Secondary | ICD-10-CM | POA: Diagnosis not present

## 2021-04-09 DIAGNOSIS — E785 Hyperlipidemia, unspecified: Secondary | ICD-10-CM

## 2021-04-09 DIAGNOSIS — N401 Enlarged prostate with lower urinary tract symptoms: Secondary | ICD-10-CM

## 2021-04-09 DIAGNOSIS — I4892 Unspecified atrial flutter: Secondary | ICD-10-CM | POA: Diagnosis not present

## 2021-04-09 DIAGNOSIS — H3530 Unspecified macular degeneration: Secondary | ICD-10-CM

## 2021-04-09 DIAGNOSIS — E1142 Type 2 diabetes mellitus with diabetic polyneuropathy: Secondary | ICD-10-CM

## 2021-04-09 DIAGNOSIS — J449 Chronic obstructive pulmonary disease, unspecified: Secondary | ICD-10-CM

## 2021-04-09 DIAGNOSIS — I110 Hypertensive heart disease with heart failure: Secondary | ICD-10-CM

## 2021-04-09 DIAGNOSIS — F17210 Nicotine dependence, cigarettes, uncomplicated: Secondary | ICD-10-CM

## 2021-04-09 DIAGNOSIS — R338 Other retention of urine: Secondary | ICD-10-CM

## 2021-04-09 DIAGNOSIS — Z7902 Long term (current) use of antithrombotics/antiplatelets: Secondary | ICD-10-CM

## 2021-04-10 DIAGNOSIS — I110 Hypertensive heart disease with heart failure: Secondary | ICD-10-CM | POA: Diagnosis not present

## 2021-04-10 DIAGNOSIS — E1151 Type 2 diabetes mellitus with diabetic peripheral angiopathy without gangrene: Secondary | ICD-10-CM | POA: Diagnosis not present

## 2021-04-10 DIAGNOSIS — I5032 Chronic diastolic (congestive) heart failure: Secondary | ICD-10-CM | POA: Diagnosis not present

## 2021-04-10 DIAGNOSIS — U071 COVID-19: Secondary | ICD-10-CM | POA: Diagnosis not present

## 2021-04-10 DIAGNOSIS — J449 Chronic obstructive pulmonary disease, unspecified: Secondary | ICD-10-CM | POA: Diagnosis not present

## 2021-04-10 DIAGNOSIS — E1142 Type 2 diabetes mellitus with diabetic polyneuropathy: Secondary | ICD-10-CM | POA: Diagnosis not present

## 2021-04-12 DIAGNOSIS — U071 COVID-19: Secondary | ICD-10-CM | POA: Diagnosis not present

## 2021-04-12 DIAGNOSIS — I5032 Chronic diastolic (congestive) heart failure: Secondary | ICD-10-CM | POA: Diagnosis not present

## 2021-04-12 DIAGNOSIS — I110 Hypertensive heart disease with heart failure: Secondary | ICD-10-CM | POA: Diagnosis not present

## 2021-04-12 DIAGNOSIS — E1151 Type 2 diabetes mellitus with diabetic peripheral angiopathy without gangrene: Secondary | ICD-10-CM | POA: Diagnosis not present

## 2021-04-12 DIAGNOSIS — J449 Chronic obstructive pulmonary disease, unspecified: Secondary | ICD-10-CM | POA: Diagnosis not present

## 2021-04-12 DIAGNOSIS — E1142 Type 2 diabetes mellitus with diabetic polyneuropathy: Secondary | ICD-10-CM | POA: Diagnosis not present

## 2021-04-15 DIAGNOSIS — I110 Hypertensive heart disease with heart failure: Secondary | ICD-10-CM | POA: Diagnosis not present

## 2021-04-15 DIAGNOSIS — E1151 Type 2 diabetes mellitus with diabetic peripheral angiopathy without gangrene: Secondary | ICD-10-CM | POA: Diagnosis not present

## 2021-04-15 DIAGNOSIS — E1142 Type 2 diabetes mellitus with diabetic polyneuropathy: Secondary | ICD-10-CM | POA: Diagnosis not present

## 2021-04-15 DIAGNOSIS — J449 Chronic obstructive pulmonary disease, unspecified: Secondary | ICD-10-CM | POA: Diagnosis not present

## 2021-04-15 DIAGNOSIS — U071 COVID-19: Secondary | ICD-10-CM | POA: Diagnosis not present

## 2021-04-15 DIAGNOSIS — I5032 Chronic diastolic (congestive) heart failure: Secondary | ICD-10-CM | POA: Diagnosis not present

## 2021-04-16 ENCOUNTER — Other Ambulatory Visit: Payer: Self-pay | Admitting: Cardiovascular Disease

## 2021-04-16 DIAGNOSIS — E1142 Type 2 diabetes mellitus with diabetic polyneuropathy: Secondary | ICD-10-CM | POA: Diagnosis not present

## 2021-04-16 DIAGNOSIS — I5032 Chronic diastolic (congestive) heart failure: Secondary | ICD-10-CM | POA: Diagnosis not present

## 2021-04-16 DIAGNOSIS — E1151 Type 2 diabetes mellitus with diabetic peripheral angiopathy without gangrene: Secondary | ICD-10-CM | POA: Diagnosis not present

## 2021-04-16 DIAGNOSIS — I110 Hypertensive heart disease with heart failure: Secondary | ICD-10-CM | POA: Diagnosis not present

## 2021-04-16 DIAGNOSIS — U071 COVID-19: Secondary | ICD-10-CM | POA: Diagnosis not present

## 2021-04-16 DIAGNOSIS — J449 Chronic obstructive pulmonary disease, unspecified: Secondary | ICD-10-CM | POA: Diagnosis not present

## 2021-04-16 NOTE — Telephone Encounter (Signed)
Prescription refill request for Eliquis received.  Indication: aflutter Last office visit: O neal, 02/26/2021 Scr: 0.93, 02/26/2021 Age: 76 yo  Weight:  72.1 kg   Refill sent.

## 2021-04-17 ENCOUNTER — Other Ambulatory Visit: Payer: Self-pay | Admitting: Family

## 2021-04-17 DIAGNOSIS — U071 COVID-19: Secondary | ICD-10-CM | POA: Diagnosis not present

## 2021-04-17 DIAGNOSIS — J449 Chronic obstructive pulmonary disease, unspecified: Secondary | ICD-10-CM | POA: Diagnosis not present

## 2021-04-17 DIAGNOSIS — E1151 Type 2 diabetes mellitus with diabetic peripheral angiopathy without gangrene: Secondary | ICD-10-CM | POA: Diagnosis not present

## 2021-04-17 DIAGNOSIS — I5032 Chronic diastolic (congestive) heart failure: Secondary | ICD-10-CM | POA: Diagnosis not present

## 2021-04-17 DIAGNOSIS — I110 Hypertensive heart disease with heart failure: Secondary | ICD-10-CM | POA: Diagnosis not present

## 2021-04-17 DIAGNOSIS — E1142 Type 2 diabetes mellitus with diabetic polyneuropathy: Secondary | ICD-10-CM | POA: Diagnosis not present

## 2021-04-18 ENCOUNTER — Other Ambulatory Visit: Payer: Self-pay

## 2021-04-18 ENCOUNTER — Inpatient Hospital Stay (HOSPITAL_COMMUNITY): Payer: Medicare Other | Attending: Hematology

## 2021-04-18 DIAGNOSIS — E538 Deficiency of other specified B group vitamins: Secondary | ICD-10-CM

## 2021-04-18 DIAGNOSIS — C3491 Malignant neoplasm of unspecified part of right bronchus or lung: Secondary | ICD-10-CM

## 2021-04-18 DIAGNOSIS — E039 Hypothyroidism, unspecified: Secondary | ICD-10-CM | POA: Insufficient documentation

## 2021-04-18 DIAGNOSIS — Z452 Encounter for adjustment and management of vascular access device: Secondary | ICD-10-CM | POA: Diagnosis not present

## 2021-04-18 DIAGNOSIS — Z85118 Personal history of other malignant neoplasm of bronchus and lung: Secondary | ICD-10-CM | POA: Diagnosis not present

## 2021-04-18 DIAGNOSIS — D500 Iron deficiency anemia secondary to blood loss (chronic): Secondary | ICD-10-CM

## 2021-04-18 DIAGNOSIS — D539 Nutritional anemia, unspecified: Secondary | ICD-10-CM | POA: Diagnosis not present

## 2021-04-18 DIAGNOSIS — D529 Folate deficiency anemia, unspecified: Secondary | ICD-10-CM

## 2021-04-18 LAB — IRON AND TIBC
Iron: 280 ug/dL — ABNORMAL HIGH (ref 45–182)
Saturation Ratios: 61 % — ABNORMAL HIGH (ref 17.9–39.5)
TIBC: 459 ug/dL — ABNORMAL HIGH (ref 250–450)
UIBC: 179 ug/dL

## 2021-04-18 LAB — CBC WITH DIFFERENTIAL/PLATELET
Abs Immature Granulocytes: 0.02 10*3/uL (ref 0.00–0.07)
Basophils Absolute: 0 10*3/uL (ref 0.0–0.1)
Basophils Relative: 0 %
Eosinophils Absolute: 0.2 10*3/uL (ref 0.0–0.5)
Eosinophils Relative: 2 %
HCT: 35.5 % — ABNORMAL LOW (ref 39.0–52.0)
Hemoglobin: 11.4 g/dL — ABNORMAL LOW (ref 13.0–17.0)
Immature Granulocytes: 0 %
Lymphocytes Relative: 9 %
Lymphs Abs: 0.6 10*3/uL — ABNORMAL LOW (ref 0.7–4.0)
MCH: 36.2 pg — ABNORMAL HIGH (ref 26.0–34.0)
MCHC: 32.1 g/dL (ref 30.0–36.0)
MCV: 112.7 fL — ABNORMAL HIGH (ref 80.0–100.0)
Monocytes Absolute: 0.8 10*3/uL (ref 0.1–1.0)
Monocytes Relative: 12 %
Neutro Abs: 5.4 10*3/uL (ref 1.7–7.7)
Neutrophils Relative %: 77 %
Platelets: 329 10*3/uL (ref 150–400)
RBC: 3.15 MIL/uL — ABNORMAL LOW (ref 4.22–5.81)
RDW: 15.8 % — ABNORMAL HIGH (ref 11.5–15.5)
WBC: 7 10*3/uL (ref 4.0–10.5)
nRBC: 0 % (ref 0.0–0.2)

## 2021-04-18 LAB — COMPREHENSIVE METABOLIC PANEL
ALT: 15 U/L (ref 0–44)
AST: 17 U/L (ref 15–41)
Albumin: 3.9 g/dL (ref 3.5–5.0)
Alkaline Phosphatase: 98 U/L (ref 38–126)
Anion gap: 9 (ref 5–15)
BUN: 18 mg/dL (ref 8–23)
CO2: 30 mmol/L (ref 22–32)
Calcium: 9.3 mg/dL (ref 8.9–10.3)
Chloride: 100 mmol/L (ref 98–111)
Creatinine, Ser: 0.94 mg/dL (ref 0.61–1.24)
GFR, Estimated: >60 mL/min (ref >60)
Glucose, Bld: 153 mg/dL — ABNORMAL HIGH (ref 70–99)
Potassium: 3.9 mmol/L (ref 3.5–5.1)
Sodium: 139 mmol/L (ref 135–145)
Total Bilirubin: 0.2 mg/dL — ABNORMAL LOW (ref 0.3–1.2)
Total Protein: 7.4 g/dL (ref 6.5–8.1)

## 2021-04-18 LAB — FOLATE: Folate: 11.8 ng/mL (ref >5.9)

## 2021-04-18 LAB — VITAMIN B12: Vitamin B-12: 158 pg/mL — ABNORMAL LOW (ref 180–914)

## 2021-04-18 LAB — TSH: TSH: 2.686 u[IU]/mL (ref 0.350–4.500)

## 2021-04-18 LAB — FERRITIN: Ferritin: 42 ng/mL (ref 24–336)

## 2021-04-20 LAB — PROTEIN ELECTROPHORESIS, SERUM
A/G Ratio: 1.1 (ref 0.7–1.7)
Albumin ELP: 3.7 g/dL (ref 2.9–4.4)
Alpha-1-Globulin: 0.3 g/dL (ref 0.0–0.4)
Alpha-2-Globulin: 0.8 g/dL (ref 0.4–1.0)
Beta Globulin: 1.2 g/dL (ref 0.7–1.3)
Gamma Globulin: 1 g/dL (ref 0.4–1.8)
Globulin, Total: 3.3 g/dL (ref 2.2–3.9)
Total Protein ELP: 7 g/dL (ref 6.0–8.5)

## 2021-04-21 LAB — COPPER, SERUM: Copper: 135 ug/dL — ABNORMAL HIGH (ref 69–132)

## 2021-04-22 LAB — METHYLMALONIC ACID, SERUM: Methylmalonic Acid, Quantitative: 404 nmol/L — ABNORMAL HIGH (ref 0–378)

## 2021-04-23 ENCOUNTER — Other Ambulatory Visit: Payer: Self-pay | Admitting: Family

## 2021-04-23 DIAGNOSIS — J449 Chronic obstructive pulmonary disease, unspecified: Secondary | ICD-10-CM | POA: Diagnosis not present

## 2021-04-23 DIAGNOSIS — E1151 Type 2 diabetes mellitus with diabetic peripheral angiopathy without gangrene: Secondary | ICD-10-CM | POA: Diagnosis not present

## 2021-04-23 DIAGNOSIS — I5032 Chronic diastolic (congestive) heart failure: Secondary | ICD-10-CM | POA: Diagnosis not present

## 2021-04-23 DIAGNOSIS — I110 Hypertensive heart disease with heart failure: Secondary | ICD-10-CM | POA: Diagnosis not present

## 2021-04-23 DIAGNOSIS — E1142 Type 2 diabetes mellitus with diabetic polyneuropathy: Secondary | ICD-10-CM | POA: Diagnosis not present

## 2021-04-23 DIAGNOSIS — U071 COVID-19: Secondary | ICD-10-CM | POA: Diagnosis not present

## 2021-04-23 MED ORDER — IRON (FERROUS SULFATE) 325 (65 FE) MG PO TABS: 325.0000 mg | ORAL_TABLET | Freq: Two times a day (BID) | ORAL | 1 refills | Status: DC

## 2021-04-23 NOTE — Telephone Encounter (Signed)
°  Prescription Request  04/23/2021  What is the name of the medication or equipment? IRON  Have you contacted your pharmacy to request a refill? YES  Which pharmacy would you like this sent to? [facility]   Patient notified that their request is being sent to the clinical staff for review and that they should receive a response within 2 business days.

## 2021-04-23 NOTE — Telephone Encounter (Signed)
Pt aware refill sent to pharmacy 

## 2021-04-25 ENCOUNTER — Inpatient Hospital Stay (HOSPITAL_COMMUNITY): Payer: Medicare Other

## 2021-04-25 ENCOUNTER — Other Ambulatory Visit: Payer: Self-pay

## 2021-04-25 ENCOUNTER — Inpatient Hospital Stay (HOSPITAL_BASED_OUTPATIENT_CLINIC_OR_DEPARTMENT_OTHER): Payer: Medicare Other | Admitting: Hematology

## 2021-04-25 DIAGNOSIS — C3491 Malignant neoplasm of unspecified part of right bronchus or lung: Secondary | ICD-10-CM

## 2021-04-25 DIAGNOSIS — M048 Other autoinflammatory syndromes: Secondary | ICD-10-CM | POA: Diagnosis not present

## 2021-04-25 DIAGNOSIS — Z85118 Personal history of other malignant neoplasm of bronchus and lung: Secondary | ICD-10-CM | POA: Diagnosis not present

## 2021-04-25 DIAGNOSIS — D500 Iron deficiency anemia secondary to blood loss (chronic): Secondary | ICD-10-CM

## 2021-04-25 DIAGNOSIS — Z452 Encounter for adjustment and management of vascular access device: Secondary | ICD-10-CM | POA: Diagnosis not present

## 2021-04-25 DIAGNOSIS — E039 Hypothyroidism, unspecified: Secondary | ICD-10-CM | POA: Diagnosis not present

## 2021-04-25 DIAGNOSIS — D539 Nutritional anemia, unspecified: Secondary | ICD-10-CM | POA: Diagnosis not present

## 2021-04-25 DIAGNOSIS — E538 Deficiency of other specified B group vitamins: Secondary | ICD-10-CM

## 2021-04-25 MED ORDER — HEPARIN SOD (PORK) LOCK FLUSH 100 UNIT/ML IV SOLN
500.0000 [IU] | Freq: Once | INTRAVENOUS | Status: AC
  Administered 2021-04-25: 500 [IU] via INTRAVENOUS

## 2021-04-25 MED ORDER — PANTOPRAZOLE SODIUM 40 MG PO TBEC
40.0000 mg | DELAYED_RELEASE_TABLET | Freq: Every day | ORAL | 3 refills | Status: DC
Start: 2021-04-25 — End: 2021-10-12

## 2021-04-25 MED ORDER — SODIUM CHLORIDE 0.9% FLUSH
10.0000 mL | Freq: Once | INTRAVENOUS | Status: AC
  Administered 2021-04-25: 10 mL

## 2021-04-25 MED ORDER — CYANOCOBALAMIN 1000 MCG/ML IJ SOLN
1000.0000 ug | Freq: Once | INTRAMUSCULAR | Status: AC
  Administered 2021-04-25: 1000 ug via INTRAMUSCULAR
  Filled 2021-04-25: qty 1

## 2021-04-25 NOTE — Progress Notes (Signed)
[name], [place] [address]   CLINIC:  Medical Oncology/Hematology  PCP:  [name], [place] / [place] [address] [phone]   REASON FOR VISIT:  Follow-up for right NSCLC  PRIOR THERAPY:  1. Chemoradiation with weekly carboplatin and paclitaxel from 06/25/2016 to 08/20/2016. 2. Consolidation durvalumab from 09/26/2016 to 09/25/2017.  NGS Results: not done  CURRENT THERAPY: surveillance  BRIEF ONCOLOGIC HISTORY:  Oncology History  Non-small cell lung cancer, right ([place])  04/17/2016 Imaging   CT chest- 1. Examination is positive for bilateral upper lobe pulmonary lesions suspicious for malignancy. Further investigation with PET-CT and tissue sampling is advised. 2. Borderline enlarged paratracheal lymph nodes and right hilar nodes. Cannot rule out metastatic adenopathy. Attention to these areas on PET-CT is advised. 3. Emphysema 4. Aortic atherosclerosis and multi vessel coronary artery calcification.   04/18/2016 Initial Diagnosis   Adenocarcinoma of lung, right ([place])   04/24/2016 PET scan   1. Adjacent hypermetabolic nodules in the RIGHT upper lobe consistent bronchogenic carcinoma. 2. Suspicion of ipsilateral hilar and paratracheal nodal metastasis. 3. Nodule in the LEFT upper lobe with suspicious morphology has a relatively low metabolic activity for size. Favor synchronous primary bronchogenic carcinoma. 4. No metabolic activity associated with a small LEFT lower lobe pulmonary nodule. Recommend attention on follow-up.   05/06/2016 Imaging   MRI brain- Negative for metastatic disease.   05/28/2016 Procedure   1. Electromagnetic navigational bronchoscopy with brushings, needle     aspirations and biopsies of bilateral upper lobe nodules. 2. Endobronchial ultrasound with mediastinal and hilar lymph node     aspirations. By Dr. [name]   06/03/2016 Pathology Results   Diagnosis 1. Lung,  biopsy, Right upper lobe - BLOOD AND BENIGN BRONCHIAL AND LUNG TISSUE. 2. Lung, biopsy, Left upper lobe - BLOOD AND BENIGN BRONCHIAL AND LUNG TISSUE.   06/03/2016 Pathology Results   Diagnosis TRANSBRONCHIAL NEEDLE ASPIRATION, NAVIGATION, RUL (SPECIMEN 5 OF 7, COLLECTED 05/28/16): MALIGNANT CELLS CONSISTENT WITH NON-SMALL CELL CARCINOMA Comment There are limited tumor cells in the cell block (insufficient for molecular testing). TTF-1 is positive in a few of the atypical cells. They appear negative for synaptophysin and cytokeratin 5/6. Overall there is limited tumor, but a lung adenocarcinoma is slightly favored. Dr. [name] has reviewed the case. The case was called to Dr. [name] on 06/03/2016.   06/13/2016 Procedure   Port placed by Dr. Arnoldo Morale   06/25/2016 - 08/15/2016 Chemotherapy   The patient had palonosetron (ALOXI) injection 0.25 mg, 0.25 mg, Intravenous,  Once, 6 of 6 cycles  CARBOplatin (PARAPLATIN) 220 mg in sodium chloride 0.9 % 250 mL chemo infusion, 220 mg (100 % of original dose 215.8 mg), Intravenous,  Once, 6 of 6 cycles Dose modification:   (original dose 215.8 mg, Cycle 1),   (original dose 215.8 mg, Cycle 5),   (original dose 215.8 mg, Cycle 6),   (original dose 215.8 mg, Cycle 2),   (original dose 215.8 mg, Cycle 3),   (original dose 215.8 mg, Cycle 4)  PACLitaxel (TAXOL) 90 mg in dextrose 5 % 250 mL chemo infusion ( for chemotherapy treatment.     06/25/2016 - 08/20/2016 Radiation Therapy   Radiation in [place], [place] by Dr. Quitman Livings.  R lung to 6600 cGy   07/09/2016 - 07/10/2016 Hospital Admission   Admit date: 07/09/2016  Admission diagnosis: Fever and chills Additional comments: Negative work-up, discharged with course of Levaquin   07/09/2016 Treatment Plan Change  Treatment deferred x 1 week due to hospitalization    08/05/2016 - 08/06/2016 Hospital Admission   Admit date: 08/05/2016 Admission diagnosis: Joint pain Additional comments: Treated with steroids with  symptomatic improvement   08/25/2016 Imaging   MRI brain- Negative for intracranial metastasis on this motion degraded examination.  Stable mild-to-moderate chronic small vessel ischemic disease.   09/18/2016 PET scan   1. Partial metabolic response. Right upper lobe 2.6 cm hypermetabolic pulmonary nodule, decreased in size and metabolism. Mildly hypermetabolic right hilar nodal metastasis, decreased in metabolism. Right paratracheal lymph node is no longer hypermetabolic. No new or progressive metastatic disease. 2. Apical left upper lobe 0.9 cm solid pulmonary nodule, slightly decreased in size, with no significant metabolism. The change in size could indicate a neoplastic etiology for this nodule. 3. Additional subcentimeter pulmonary nodules in the left lung are stable and below PET resolution . 4. Additional findings include aortic atherosclerosis, coronary atherosclerosis, moderate emphysema, stable small pericardial effusion/thickening and mild sigmoid diverticulosis.   01/13/2017 Imaging   CT chest with contrast IMPRESSION: 1. No change in size of the hypermetabolic nodular thickening in the RIGHT upper lobe. 2. LEFT upper lobe spiculated nodule is stable. 3. Rounded LEFT lower lobe pulmonary nodule is stable. 4. No evidence of lung cancer progression.   05/08/2017 Adverse Reaction   Development of Hypothyroidism- likely secondary to immunotherapy.  Levothyroxine therapy initiated.      CANCER STAGING:  Cancer Staging  Non-small cell lung cancer, right [facility]) Staging form: Lung, AJCC 8th Edition - Clinical stage from 06/05/2016: Stage IIIA (cT1b(2), cN2, cM0) - Signed by Baird Cancer, PA-C on 08/21/2016   INTERVAL HISTORY:  [name], a 76 y.o. male, returns for routine follow-up of his right NSCLC. John [name] was last seen on 12/24/2020.   Today he reports feeling good. He denies hematochezia and hematuria. He has not previously taken vitamin B-12 tablets.  He takes iron tablets BID.   REVIEW OF SYSTEMS:  Review of Systems  Constitutional:  Negative for appetite change and fatigue.  Respiratory:  Positive for cough and shortness of breath (COPD).   Musculoskeletal:  Positive for arthralgias (5/10 hips) and myalgias (5/10 back of legs).  Psychiatric/Behavioral:  Positive for sleep disturbance.   All other systems reviewed and are negative.  PAST MEDICAL/SURGICAL HISTORY:  Past Medical History:  Diagnosis Date   Adenocarcinoma of lung, right ([place]) 04/18/2016   Anxiety    Arthritis    Asthma    BPH (benign prostatic hyperplasia)    with urinary retention 02/06/20   CHF (congestive heart failure) (HCC)    COPD (chronic obstructive pulmonary disease) (HCC)    Depression    Diabetes mellitus without complication ([place])    no meds   Diabetes mellitus, type II ([facility])    DM (diabetes mellitus) ([facility]) 07/09/2016   Dyspnea    History of kidney stones    History of radiation therapy 06/25/16-08/20/16   right lung   History of radiation therapy 09/17/2020   right lung 09/10/2020-09/17/2020   Dr Sondra Come   Hyperlipidemia    Hypertension    Hypertension    Hypothyroidism    Macular degeneration    Neuropathy    Non-small cell lung cancer, right ([facility]) 04/18/2016   Peripheral vascular disease ([facility])    Prostatitis    Pulmonary nodule, left 07/16/2016   Sleep apnea    cpap   Past Surgical History:  Procedure Laterality Date   ABDOMINAL AORTOGRAM W/LOWER EXTREMITY Left 02/06/2020  Procedure: ABDOMINAL AORTOGRAM W/LOWER EXTREMITY;  Surgeon: Lorretta Harp, MD;  Location: [facility];  Service: Cardiovascular;  Laterality: Left;   CARDIOVERSION N/A 10/05/2020   Procedure: CARDIOVERSION;  Surgeon: Geralynn Rile, MD;  Location: [facility];  Service: Cardiovascular;  Laterality: N/A;   CATARACT EXTRACTION, BILATERAL Bilateral    ENDARTERECTOMY FEMORAL Right 08/20/2020   Procedure: ENDARTERECTOMY  RIGHT FEMORAL ARTERY;  Surgeon: Marty Heck, MD;  Location: [facility];  Service: Vascular;  Laterality: Right;   ESOPHAGOGASTRODUODENOSCOPY (EGD) WITH PROPOFOL N/A 09/06/2020   Procedure: ESOPHAGOGASTRODUODENOSCOPY (EGD) WITH PROPOFOL;  Surgeon: Irene Shipper, MD;  Location: [facility];  Service: Endoscopy;  Laterality: N/A;   INSERTION OF ILIAC STENT Right 08/20/2020   Procedure: RETROGRADE INSERTION OF RIGHT ILIAC STENT;  Surgeon: Marty Heck, MD;  Location: [facility];  Service: Vascular;  Laterality: Right;   INTRAOPERATIVE ARTERIOGRAM Right 08/20/2020   Procedure: INTRA OPERATIVE ARTERIOGRAM ILIAC;  Surgeon: Marty Heck, MD;  Location: [facility];  Service: Vascular;  Laterality: Right;   PATCH ANGIOPLASTY Right 08/20/2020   Procedure: PATCH ANGIOPLASTY RIGHT FEMORAL ARTERY;  Surgeon: Marty Heck, MD;  Location: [facility];  Service: Vascular;  Laterality: Right;   PORTACATH PLACEMENT Left 06/13/2016   Procedure: INSERTION PORT-A-CATH;  Surgeon: Aviva Signs, MD;  Location: [facility];  Service: General;  Laterality: Left;   TRANSURETHRAL RESECTION OF PROSTATE N/A 05/31/2020   Procedure: TRANSURETHRAL RESECTION OF THE PROSTATE (TURP);  Surgeon: Cleon Gustin, MD;  Location: [facility];  Service: Urology;  Laterality: N/A;   VIDEO BRONCHOSCOPY WITH ENDOBRONCHIAL NAVIGATION N/A 05/28/2016   Procedure: VIDEO BRONCHOSCOPY WITH ENDOBRONCHIAL NAVIGATION;  Surgeon: [name], MD;  Location: [facility];  Service: Thoracic;  Laterality: N/A;   VIDEO BRONCHOSCOPY WITH ENDOBRONCHIAL ULTRASOUND N/A 05/28/2016   Procedure: VIDEO BRONCHOSCOPY WITH ENDOBRONCHIAL ULTRASOUND;  Surgeon: [name], MD;  [facility]: [facility];  Service: Thoracic;  Laterality: N/A;    SOCIAL HISTORY:  Social History   Socioeconomic History   Marital status: Soil scientist    Spouse name: Not on file   Number of children: 5   Years of education: 10   Highest education level: 10th grade  Occupational History   Occupation: Retired  Tobacco  Use   Smoking status: Every Day    Packs/day: 0.50    Years: 55.00    Pack years: 27.50    Types: Cigarettes    Start date: 03/11/1961   Smokeless tobacco: Never   Tobacco comments:    1/2 pack to 1 pack per day 12/14/19  Vaping Use   Vaping Use: Never used  Substance and Sexual Activity   Alcohol use: No   Drug use: No   Sexual activity: Yes    Birth control/protection: None  Other Topics Concern   Not on file  Social History Narrative   [name] is his POA and lives with him. Children out of town - one in [facility], others in [place].   Social Determinants of Health   Financial Resource Strain: Low Risk    Difficulty of Paying Living Expenses: Not hard at all  Food Insecurity: No Food Insecurity   Worried About Charity fundraiser in the Last Year: Never true   [place] in the Last Year: Never true  Transportation Needs: No Transportation Needs   Lack of Transportation (Medical): No   Lack of Transportation (Non-Medical): No  Physical Activity: Unknown   Days of Exercise  per Week: 7 days   Minutes of Exercise per Session: Not on file  Stress: No Stress Concern Present   Feeling of Stress : Not at all  Social Connections: Moderately Isolated   Frequency of Communication with Friends and Family: More than three times a week   Frequency of Social Gatherings with Friends and Family: Once a week   Attends Religious Services: Never   Marine scientist or Organizations: No   Attends Music therapist: Never   Marital Status: Living with partner  Human resources officer Violence: Not At Risk   Fear of Current or Ex-Partner: No   Emotionally Abused: No   Physically Abused: No   Sexually Abused: No    FAMILY HISTORY:  Family History  Problem Relation Age of Onset   Hypertension Mother    Diabetes Father    Heart disease Father    Stroke Father    Hypertension Sister     CURRENT MEDICATIONS:  Current Outpatient Medications  Medication Sig Dispense  Refill   apixaban (ELIQUIS) 5 MG TABS tablet TAKE 1 TABLET BY MOUTH TWICE A DAY 60 tablet 5   aspirin EC 81 MG tablet Take 1 tablet (81 mg total) by mouth daily. Swallow whole. 90 tablet 3   Blood Glucose Monitoring Suppl (ONETOUCH VERIO REFLECT) w/Device KIT Use to test blood sugars daily as directed. DX: E11.9 1 kit 1   ezetimibe (ZETIA) 10 MG tablet Take 1 tablet (10 mg total) by mouth daily. 90 tablet 3   Fluticasone-Umeclidin-Vilant (TRELEGY ELLIPTA) 100-62.5-25 MCG/INH AEPB Inhale 1 puff into the lungs daily. 1 each 11   gabapentin (NEURONTIN) 300 MG capsule TAKE 1 CAPSULE BY MOUTH THREE TIMES A DAY 270 capsule 0   Iron, Ferrous Sulfate, 325 (65 Fe) MG TABS Take 325 mg by mouth 2 (two) times daily. 180 tablet 1   KLOR-CON M20 20 MEQ tablet TAKE 1 TABLET BY MOUTH EVERY DAY 90 tablet 0   levocetirizine (XYZAL) 5 MG tablet Take 1 tablet (5 mg total) by mouth at bedtime. 30 tablet 2   levothyroxine (SYNTHROID) 50 MCG tablet TAKE 1 TABLET BY MOUTH EVERY DAY 90 tablet 1   linaclotide (LINZESS) 145 MCG CAPS capsule Take 1 capsule (145 mcg total) by mouth daily. To regulate bowel movements 30 capsule 5   nebivolol (BYSTOLIC) 2.5 MG tablet Take 1 tablet (2.5 mg total) by mouth daily. 30 tablet 6   OneTouch Delica Lancets 40J MISC Use to test blood sugars daily as directed. DX: E11.9 100 each 1   ONETOUCH VERIO test strip USE TO TEST BLOOD SUGARS DAILY AS DIRECTED. DX: E11.9 100 strip 12   pravastatin (PRAVACHOL) 40 MG tablet TAKE 1 TABLET (40 MG TOTAL) BY MOUTH ON MONDAYS , WEDNESDAYS, AND FRIDAYS AS DIRECTED 48 tablet 3   Semaglutide (RYBELSUS) 3 MG TABS Take 3 mg by mouth daily. 90 tablet 1   triamcinolone cream (KENALOG) 0.1 % Apply 1 application topically 2 (two) times daily. 80 g 1   zolpidem (AMBIEN) 5 MG tablet Take 1 tablet (5 mg total) by mouth at bedtime as needed for sleep. 30 tablet 2   albuterol (VENTOLIN HFA) 108 (90 Base) MCG/ACT inhaler Inhale 2 puffs into the lungs every 4 (four)  hours as needed for wheezing or shortness of breath. (Patient not taking: Reported on 04/25/2021) 8 g 6   fluticasone (FLONASE) 50 MCG/ACT nasal spray PLACE 1 SPRAY INTO BOTH NOSTRILS DAILY AS NEEDED FOR ALLERGIES OR RHINITIS. (Patient not  taking: Reported on 04/25/2021) 48 mL 3   hydrOXYzine (VISTARIL) 25 MG capsule Take 1 capsule (25 mg total) by mouth 3 (three) times daily as needed for itching. (Patient not taking: Reported on 04/25/2021) 60 capsule 1   pantoprazole (PROTONIX) 40 MG tablet Take 1 tablet (40 mg total) by mouth daily. 90 tablet 1   Torsemide 40 MG TABS Take 40 mg by mouth daily. Can take an additional 47m in the afternoon as needed for weight gain (3lbs in 1 day or 5lbs in 1 week) or worsening lower extremity edema or shortness of breath. 180 tablet 3   No current facility-administered medications for this visit.    ALLERGIES:  Allergies  Allergen Reactions   Sulfa Antibiotics Swelling    Mouth swelling   Atorvastatin Other (See Comments)    Muscle aches - tolerating Pravastatin 40 mg MWF   Jardiance [Empagliflozin] Other (See Comments)    FEELS SLUGGISH, TIRED   Lopressor [Metoprolol] Other (See Comments)    Fatigue   Rosuvastatin Other (See Comments)    Muscle aches - tolerating Pravastatin 40 mg MWF   Temazepam Other (See Comments)    Made insomnia worse     PHYSICAL EXAM:  Performance status (ECOG): 1 - Symptomatic but completely ambulatory  There were no vitals filed for this visit. Wt Readings from Last 3 Encounters:  03/26/21 159 lb (72.1 kg)  03/05/21 163 lb 6.4 oz (74.1 kg)  02/26/21 160 lb (72.6 kg)   Physical Exam Vitals reviewed.  Constitutional:      Appearance: Normal appearance.  Cardiovascular:     Rate and Rhythm: Normal rate and regular rhythm.     Pulses: Normal pulses.     Heart sounds: Normal heart sounds.  Pulmonary:     Effort: Pulmonary effort is normal.     Breath sounds: Normal breath sounds.  Neurological:     General: No  focal deficit present.     Mental Status: He is alert and oriented to person, place, and time.  Psychiatric:        Mood and Affect: Mood normal.        Behavior: Behavior normal.     LABORATORY DATA:  I have reviewed the labs as listed.  CBC Latest Ref Rng & Units 04/18/2021 12/17/2020 12/03/2020  WBC 4.0 - 10.5 K/uL 7.0 7.1 6.7  Hemoglobin 13.0 - 17.0 g/dL 11.4(L) 11.4(L) 11.1(L)  Hematocrit 39.0 - 52.0 % 35.5(L) 37.2(L) 34.3(L)  Platelets 150 - 400 K/uL 329 351 340   CMP Latest Ref Rng & Units 04/18/2021 02/26/2021 12/17/2020  Glucose 70 - 99 mg/dL 153(H) 139(H) 222(H)  BUN 8 - 23 mg/dL 18 16 12   Creatinine 0.61 - 1.24 mg/dL 0.94 0.93 0.95  Sodium 135 - 145 mmol/L 139 146(H) 136  Potassium 3.5 - 5.1 mmol/L 3.9 3.8 3.8  Chloride 98 - 111 mmol/L 100 102 96(L)  CO2 22 - 32 mmol/L 30 28 29   Calcium 8.9 - 10.3 mg/dL 9.3 9.4 9.0  Total Protein 6.5 - 8.1 g/dL 7.4 - 7.0  Total Bilirubin 0.3 - 1.2 mg/dL 0.2(L) - 0.3  Alkaline Phos 38 - 126 U/L 98 - 98  AST 15 - 41 U/L 17 - 19  ALT 0 - 44 U/L 15 - 9    DIAGNOSTIC IMAGING:  I have independently reviewed the scans and discussed with the patient. VAS [place] WITH/WO TBI  Result Date: 03/26/2021  LOWER EXTREMITY DOPPLER STUDY Patient Name:  John [name]  Date of Exam:   03/26/2021 Medical Rec #: [id number]         Accession #:    [id number] Date of Birth: 1945/09/22         Patient Gender: M Patient Age:   82 years Exam Location:  [facility] Procedure:      VAS [place] ABI WITH/WO TBI Referring Phys: --------------------------------------------------------------------------------  Indications: Claudication, and peripheral artery disease. High Risk Factors: Hypertension, hyperlipidemia, Diabetes.  Vascular Interventions: HPI: This is a 76 y.o. male who is s/p                         1. Intravascular ultrasound (IVUS) of right external                         iliac artery, right common iliac artery, and infrarenal                          aorta                         2. Right iliac arteriogram                         3. Angioplasty and stent of right common iliac artery (8                         mm x 29 mm VBX)                         4. Right common femoral endarterectomy with                         profundoplasty and bovine pericardial patch angioplasty                         On 08/20/2020 by Dr. Carlis Abbott for lifestyle limiting                         claudication. Performing Technologist: Ronal Fear RVS, RCS  Examination Guidelines: A complete evaluation includes at minimum, Doppler waveform signals and systolic blood pressure reading at the level of bilateral brachial, anterior tibial, and posterior tibial arteries, when vessel segments are accessible. Bilateral testing is considered an integral part of a complete examination. Photoelectric Plethysmograph (PPG) waveforms and toe systolic pressure readings are included as required and additional duplex testing as needed. Limited examinations for reoccurring indications may be performed as noted.  ABI Findings: +---------+------------------+-----+----------+--------+  Right     Rt Pressure (mmHg) Index Waveform   Comment   +---------+------------------+-----+----------+--------+  Brachial  138                                           +---------+------------------+-----+----------+--------+  PTA       93                 0.67  biphasic             +---------+------------------+-----+----------+--------+  DP        90  0.65  monophasic           +---------+------------------+-----+----------+--------+  Great Toe                          Absent               +---------+------------------+-----+----------+--------+ +---------+------------------+-----+----------+-------+  Left      Lt Pressure (mmHg) Index Waveform   Comment  +---------+------------------+-----+----------+-------+  Brachial  137                                           +---------+------------------+-----+----------+-------+  PTA       117                0.85  biphasic            +---------+------------------+-----+----------+-------+  DP        108                0.78  monophasic          +---------+------------------+-----+----------+-------+  Great Toe 78                 0.57                      +---------+------------------+-----+----------+-------+ +-------+-----------+-----------+------------+------------+  ABI/TBI Today's ABI Today's TBI Previous ABI Previous TBI  +-------+-----------+-----------+------------+------------+  Right   0.67        absent      0.72         0.54          +-------+-----------+-----------+------------+------------+  Left    0.85        0.57        0.99         0.67          +-------+-----------+-----------+------------+------------+  Bilateral ABIs appear decreased compared to prior study on 10/27/2019. No pre-operative ABIs available for comparison.  Summary: Right: Resting right ankle-brachial index indicates moderate right lower extremity arterial disease. Left: Resting left ankle-brachial index indicates mild left lower extremity arterial disease.  *See table(s) above for measurements and observations.  Electronically signed by Deitra Mayo MD on 03/26/2021 at 3:48:37 PM.    Final      ASSESSMENT:  1.  Stage IIIa (T1b N2 cM0) NSCLC, favoring adenocarcinoma: -Chemo XRT with weekly carboplatin and paclitaxel completed on 08/20/2016. -Consolidation durvalumab from 09/26/2016 through 09/25/2017. -Reviewed CT chest on 11/07/2019 which showed increasing size of the upper lobe pulmonary nodules with no new nodule in the left upper lobe suspicious for meta stasis.  Also increasing confluent soft tissue in the right suprahilar region, thought to be postradiation changes. -PET scan on 11/21/2019 showed suspected radiation changes in the right paramediastinal/perihilar region although mild hypermetabolism is present in the right retroareolar  region.SUV 2.5.  While progressive soft tissue is present in this region, this hypermetabolism remains nonfocal and favors radiation changes.  7 mm central left upper lobe nodule max SUV 1.4.  9 mm aggregate nodule in the lateral right upper lobe max SUV 2.4.  These are minimally progressive from more remote prior scans.  6 mm left lower lobe nodule unchanged.  No hypermetabolic thoracic adenopathy. -CT chest on 03/23/2020 showed many of the lung nodules are stable.  Couple of nodules have grown by few millimeters.  No new areas seen. - SBRT to the  right and left lung lesions, 76 Gray in 3 fractions from 09/10/2020 through 09/17/2020.  2.  Normocytic to macrocytic anemia: - Found to have hemoglobin 6.3 on 10/09/2020 and received 2 units PRBC while hospitalized.  I have reviewed hospitalization records. - EGD on 09/06/2020 with tiny gastric ulcer without bleeding or stigmata. - Blood loss this year most likely postoperative in nature (urological surgery in February and vascular surgery in May).   PLAN:  1.  Stage IIIa (T1b N2 cM0) NSCLC, favoring adenocarcinoma: - He is continue to smoke half pack of cigarettes daily. - I will arrange for another CT scan in 2 months prior to next visit.   2.  Hypothyroidism: - TSH today is 2.6.  Continue current dose of Synthroid.   3.  Difficulty staying asleep: - Continue temazepam as needed.  4.  Normocytic to macrocytic anemia: - Reviewed anemia labs from 04/18/2021.  Ferritin is 42 with percent saturation 61.  B12 is low with elevated methylmalonic acid.  SPEP is normal. - He is taking iron tablet twice daily for the past 6 months.  He may cut back iron tablet once daily. - We will give him B12 injection today.  He will start taking B12 1 mg tablet daily. - Cologuard test was reportedly positive.  GI consult for colonoscopy is pending. - RTC 2 months with repeat labs.   Orders placed this encounter:  No orders of the defined types were placed in this  encounter.    Derek Jack, MD [place] [phone]   I, Thana Ates, am acting as a scribe for Dr. Derek Jack.  I, Derek Jack MD, have reviewed the above documentation for accuracy and completeness, and I agree with the above.

## 2021-04-25 NOTE — Addendum Note (Signed)
Addended by: Benjiman Core D on: 04/25/2021 01:02 PM   Modules accepted: Orders

## 2021-04-25 NOTE — Progress Notes (Signed)
Verbal order received from A Anderson RN / Dr. Delton Coombes to administer a B12 injection today.

## 2021-04-25 NOTE — Patient Instructions (Signed)
[place] at [facility] Discharge Instructions   You were seen and examined today by Dr. Delton Coombes.  He reviewed your lab work which shows your B12 is low.  We will give you a B12 injection today, then you need to start taking B12 1mg  tablet daily.  You can get this medication over the counter.  Continue taking iron pills as you have been.  We will arrange a CT scan in about 2 months and repeat blood work.  Return to clinic after CT scan and labs to review results.    Thank you for choosing [place] at [facility] to provide your oncology and hematology care.  To afford each patient quality time with our provider, please arrive at least 15 minutes before your scheduled appointment time.   If you have a lab appointment with the [facility] please come in thru the Main Entrance and check in at the main information desk.  You need to re-schedule your appointment should you arrive 10 or more minutes late.  We strive to give you quality time with our providers, and arriving late affects you and other patients whose appointments are after yours.  Also, if you no show three or more times for appointments you may be dismissed from the clinic at the providers discretion.     Again, thank you for choosing [facility].  Our hope is that these requests will decrease the amount of time that you wait before being seen by our physicians.       _____________________________________________________________  Should you have questions after your visit to [facility], please contact our office at [phone] and follow the prompts.  Our office hours are 8:00 a.m. and 4:30 p.m. Monday - Friday.  Please note that voicemails left after 4:00 p.m. may not be returned until the following business day.  We are closed weekends and major holidays.  You do have access to a nurse 24-7, just call the main number to the clinic [phone] and  do not press any options, hold on the line and a nurse will answer the phone.    For prescription refill requests, have your pharmacy contact our office and allow 72 hours.    Due to Covid, you will need to wear a mask upon entering the hospital. If you do not have a mask, a mask will be given to you at the Main Entrance upon arrival. For doctor visits, patients may have 1 support person age 73 or older with them. For treatment visits, patients can not have anyone with them due to social distancing guidelines and our immunocompromised population.

## 2021-04-26 ENCOUNTER — Other Ambulatory Visit: Payer: Self-pay | Admitting: Family

## 2021-04-26 ENCOUNTER — Other Ambulatory Visit: Payer: Self-pay | Admitting: Cardiovascular Disease

## 2021-04-26 DIAGNOSIS — Z7902 Long term (current) use of antithrombotics/antiplatelets: Secondary | ICD-10-CM | POA: Diagnosis not present

## 2021-04-26 DIAGNOSIS — N401 Enlarged prostate with lower urinary tract symptoms: Secondary | ICD-10-CM | POA: Diagnosis not present

## 2021-04-26 DIAGNOSIS — E785 Hyperlipidemia, unspecified: Secondary | ICD-10-CM | POA: Diagnosis not present

## 2021-04-26 DIAGNOSIS — N138 Other obstructive and reflux uropathy: Secondary | ICD-10-CM | POA: Diagnosis not present

## 2021-04-26 DIAGNOSIS — I4892 Unspecified atrial flutter: Secondary | ICD-10-CM | POA: Diagnosis not present

## 2021-04-26 DIAGNOSIS — J449 Chronic obstructive pulmonary disease, unspecified: Secondary | ICD-10-CM | POA: Diagnosis not present

## 2021-04-26 DIAGNOSIS — E1142 Type 2 diabetes mellitus with diabetic polyneuropathy: Secondary | ICD-10-CM

## 2021-04-26 DIAGNOSIS — F17210 Nicotine dependence, cigarettes, uncomplicated: Secondary | ICD-10-CM | POA: Diagnosis not present

## 2021-04-26 DIAGNOSIS — F5101 Primary insomnia: Secondary | ICD-10-CM | POA: Diagnosis not present

## 2021-04-26 DIAGNOSIS — I5032 Chronic diastolic (congestive) heart failure: Secondary | ICD-10-CM | POA: Diagnosis not present

## 2021-04-26 DIAGNOSIS — U071 COVID-19: Secondary | ICD-10-CM | POA: Diagnosis not present

## 2021-04-26 DIAGNOSIS — R338 Other retention of urine: Secondary | ICD-10-CM | POA: Diagnosis not present

## 2021-04-26 DIAGNOSIS — K5900 Constipation, unspecified: Secondary | ICD-10-CM | POA: Diagnosis not present

## 2021-04-26 DIAGNOSIS — E1169 Type 2 diabetes mellitus with other specified complication: Secondary | ICD-10-CM | POA: Diagnosis not present

## 2021-04-26 DIAGNOSIS — I110 Hypertensive heart disease with heart failure: Secondary | ICD-10-CM | POA: Diagnosis not present

## 2021-04-26 DIAGNOSIS — D630 Anemia in neoplastic disease: Secondary | ICD-10-CM | POA: Diagnosis not present

## 2021-04-26 DIAGNOSIS — E039 Hypothyroidism, unspecified: Secondary | ICD-10-CM | POA: Diagnosis not present

## 2021-04-26 DIAGNOSIS — H3530 Unspecified macular degeneration: Secondary | ICD-10-CM | POA: Diagnosis not present

## 2021-04-26 DIAGNOSIS — E1151 Type 2 diabetes mellitus with diabetic peripheral angiopathy without gangrene: Secondary | ICD-10-CM | POA: Diagnosis not present

## 2021-04-26 DIAGNOSIS — Z7901 Long term (current) use of anticoagulants: Secondary | ICD-10-CM | POA: Diagnosis not present

## 2021-04-26 DIAGNOSIS — G4730 Sleep apnea, unspecified: Secondary | ICD-10-CM | POA: Diagnosis not present

## 2021-04-26 DIAGNOSIS — C3490 Malignant neoplasm of unspecified part of unspecified bronchus or lung: Secondary | ICD-10-CM | POA: Diagnosis not present

## 2021-04-29 ENCOUNTER — Telehealth: Payer: Self-pay | Admitting: Cardiovascular Disease

## 2021-04-29 ENCOUNTER — Other Ambulatory Visit: Payer: Self-pay | Admitting: Cardiovascular Disease

## 2021-04-29 NOTE — Telephone Encounter (Signed)
Called pt to see how he has been taking his medication. Per Dr. Kathalene Frames notes pt is to take 40 mg daily and an extra dose in the afternoon. Per the phone note pt has been taking 20 mg. No answer at this time, unable to leave a message.

## 2021-04-29 NOTE — Telephone Encounter (Signed)
° °  Pt c/o medication issue:  1. Name of Medication:   Torsemide 40 MG TABS     2. How are you currently taking this medication (dosage and times per day)?   3. Are you having a reaction (difficulty breathing--STAT)?   4. What is your medication issue? Pt said he's been taking Torsemide 20 mg, he needs refill for 20 mg

## 2021-04-30 ENCOUNTER — Ambulatory Visit (INDEPENDENT_AMBULATORY_CARE_PROVIDER_SITE_OTHER): Payer: Medicare Other | Admitting: Internal Medicine

## 2021-04-30 ENCOUNTER — Encounter: Payer: Self-pay | Admitting: Internal Medicine

## 2021-04-30 ENCOUNTER — Telehealth: Payer: Self-pay | Admitting: Family

## 2021-04-30 VITALS — BP 122/80 | HR 87 | Ht 66.0 in | Wt 164.5 lb

## 2021-04-30 DIAGNOSIS — R195 Other fecal abnormalities: Secondary | ICD-10-CM

## 2021-04-30 DIAGNOSIS — D509 Iron deficiency anemia, unspecified: Secondary | ICD-10-CM

## 2021-04-30 DIAGNOSIS — E538 Deficiency of other specified B group vitamins: Secondary | ICD-10-CM

## 2021-04-30 DIAGNOSIS — Z7901 Long term (current) use of anticoagulants: Secondary | ICD-10-CM

## 2021-04-30 MED ORDER — PLENVU 140 G PO SOLR: 1.0000 | Freq: Once | ORAL | 0 refills | Status: AC

## 2021-04-30 NOTE — Patient Instructions (Signed)
If you are age 76 or older, your body mass index should be between 23-30. Your Body mass index is 26.55 kg/m. If this is out of the aforementioned range listed, please consider follow up with your Primary Care Provider.  If you are age 64 or younger, your body mass index should be between 19-25. Your Body mass index is 26.55 kg/m. If this is out of the aformentioned range listed, please consider follow up with your Primary Care Provider.   ________________________________________________________  The  GI providers would like to encourage you to use [facility] to communicate with providers for non-urgent requests or questions.  Due to long hold times on the telephone, sending your provider a message by [facility] may be a faster and more efficient way to get a response.  Please allow 48 business hours for a response.  Please remember that this is for non-urgent requests.  _______________________________________________________  Dennis Bast have been scheduled for a colonoscopy. Please follow written instructions given to you at your visit today.  Please pick up your prep supplies at the pharmacy within the next 1-3 days. If you use inhalers (even only as needed), please bring them with you on the day of your procedure.

## 2021-04-30 NOTE — Telephone Encounter (Signed)
Pt called stating that Dr Marisue Ivan normally prescribes his Torsemide Rx but says he is out of the medicine and has [facility] [place] to get [facility]ills but they say he doesn't have any refills. Pt says he has tried contacting Dr Racheal Patches office for refills and so has the pharmacy but can't get any response from anyone.  Wants to know if [name] can call in refills for his Torsemide Rx.

## 2021-04-30 NOTE — Progress Notes (Signed)
HISTORY OF PRESENT ILLNESS:  John [name] is a 76 y.o. male with multiple significant medical problems including non-small cell lung cancer, chronic diastolic CHF (ejection fraction on last echo 70 to 65%), COPD, obstructive sleep apnea, chronic hypoxic respiratory failure with as needed oxygen use, diabetes mellitus, hypothyroidism, peripheral vascular disease with femoral endarterectomy and common iliac stenting, paroxysmal atrial fibrillation on Eliquis, gastric ulcer.  He is sent today by his primary care provider regarding post hospital follow-up and positive Cologuard testing.  I saw the patient in the hospital as an unassigned patient June 2022 regarding iron deficiency anemia and Hemoccult positive stool.  He underwent upper endoscopy and was found to have 2 tiny gastric ulcers without bleeding.  He was placed on PPI which she remains on.  He was advised with regards to iron replacement therapy which he remains on.  Outpatient GI follow-up with probable colonoscopy thereafter.  Patient since been diagnosed with B12 deficiency.  His PCP performed Cologuard testing December 2022 which returned positive.  The patient denies melena or hematochezia.  Review of blood counts from September 2022 shows a hemoglobin of 11.4.  This is improved from hemoglobin 7.0 July 2022.  Patient is accompanied today by his wife, Caryl Asp.  His GI review of systems is remarkable for occasional constipation for which she takes Linzess.  He does have occasional bloating.  They tell me that he underwent colonoscopy in [facility] about 7 years ago with no problems found.  Follow-up colonoscopy in 10 years recommended.  CT scan October 09, 2020 revealed mildly nodular hepatic contour.  No acute abnormalities.  Right groin seroma noted. REVIEW OF SYSTEMS:  All non-GI ROS negative unless otherwise stated in the HPI except for arthritis, visual change, cough, fatigue, itching, excessive thirst, hearing problems, sleeping problems,  shortness of breath  Past Medical History:  Diagnosis Date   Adenocarcinoma of lung, right ([place]) 04/18/2016   Anxiety    Arthritis    Asthma    BPH (benign prostatic hyperplasia)    with urinary retention 02/06/20   CHF (congestive heart failure) (HCC)    COPD (chronic obstructive pulmonary disease) ([place])    Depression    Diabetes mellitus without complication ([place])    no meds   Diabetes mellitus, type II ([place])    DM (diabetes mellitus) ([place]) 07/09/2016   Dyspnea    History of kidney stones    History of radiation therapy 06/25/16-08/20/16   right lung   History of radiation therapy 09/17/2020   right lung 09/10/2020-09/17/2020   Dr Sondra Come   Hyperlipidemia    Hypertension    Hypertension    Hypothyroidism    Macular degeneration    Neuropathy    Non-small cell lung cancer, right ([place]) 04/18/2016   Peripheral vascular disease ([place])    Prostatitis    Pulmonary nodule, left 07/16/2016   Sleep apnea    cpap    Past Surgical History:  Procedure Laterality Date   ABDOMINAL AORTOGRAM W/LOWER EXTREMITY Left 02/06/2020   Procedure: ABDOMINAL AORTOGRAM W/LOWER EXTREMITY;  Surgeon: Lorretta Harp, MD;  Location: [place] [facility];  Service: Cardiovascular;  Laterality: Left;   CARDIOVERSION N/A 10/05/2020   Procedure: CARDIOVERSION;  Surgeon: Geralynn Rile, MD;  Location: [place];  Service: Cardiovascular;  Laterality: N/A;   CATARACT EXTRACTION, BILATERAL Bilateral    ENDARTERECTOMY FEMORAL Right 08/20/2020   Procedure: ENDARTERECTOMY  RIGHT FEMORAL ARTERY;  Surgeon: Marty Heck, MD;  Location: [place];  Service: Vascular;  Laterality:  Right;   ESOPHAGOGASTRODUODENOSCOPY (EGD) WITH PROPOFOL N/A 09/06/2020   Procedure: ESOPHAGOGASTRODUODENOSCOPY (EGD) WITH PROPOFOL;  Surgeon: Irene Shipper, MD;  [facility];  Service: Endoscopy;  Laterality: N/A;   INSERTION OF ILIAC STENT Right 08/20/2020   Procedure: RETROGRADE INSERTION OF RIGHT ILIAC STENT;  Surgeon:  Marty Heck, MD;  Location: [place];  Service: Vascular;  Laterality: Right;   INTRAOPERATIVE ARTERIOGRAM Right 08/20/2020   Procedure: INTRA OPERATIVE ARTERIOGRAM ILIAC;  Surgeon: Marty Heck, MD;  Location: [place];  Service: Vascular;  Laterality: Right;   PATCH ANGIOPLASTY Right 08/20/2020   Procedure: PATCH ANGIOPLASTY RIGHT FEMORAL ARTERY;  Surgeon: Marty Heck, MD;  Location: [place];  Service: Vascular;  Laterality: Right;   PORTACATH PLACEMENT Left 06/13/2016   Procedure: INSERTION PORT-A-CATH;  Surgeon: Aviva Signs, MD;  Location: [facility];  Service: General;  Laterality: Left;   TRANSURETHRAL RESECTION OF PROSTATE N/A 05/31/2020   Procedure: TRANSURETHRAL RESECTION OF THE PROSTATE (TURP);  Surgeon: Cleon Gustin, MD;  Location: [facility];  Service: Urology;  Laterality: N/A;   VIDEO BRONCHOSCOPY WITH ENDOBRONCHIAL NAVIGATION N/A 05/28/2016   Procedure: VIDEO BRONCHOSCOPY WITH ENDOBRONCHIAL NAVIGATION;  Surgeon: [name], MD;  Location: [place];  Service: Thoracic;  Laterality: N/A;   VIDEO BRONCHOSCOPY WITH ENDOBRONCHIAL ULTRASOUND N/A 05/28/2016   Procedure: VIDEO BRONCHOSCOPY WITH ENDOBRONCHIAL ULTRASOUND;  Surgeon: [name], MD;  Location: [place];  Service: Thoracic;  Laterality: N/A;    Social History Frederica Kuster  reports that he has been smoking cigarettes. He started smoking about 60 years ago. He has a 27.50 pack-year smoking history. He has never used smokeless tobacco. He reports that he does not drink alcohol and does not use drugs.  family history includes Diabetes in his father; Heart disease in his father; Hypertension in his mother and sister; Stroke in his father.  Allergies  Allergen Reactions   Sulfa Antibiotics Swelling    Mouth swelling   Atorvastatin Other (See Comments)    Muscle aches - tolerating Pravastatin 40 mg MWF   Jardiance [Empagliflozin] Other (See Comments)    FEELS SLUGGISH, TIRED   Lopressor  [Metoprolol] Other (See Comments)    Fatigue   Rosuvastatin Other (See Comments)    Muscle aches - tolerating Pravastatin 40 mg MWF   Temazepam Other (See Comments)    Made insomnia worse        PHYSICAL EXAMINATION: Vital signs: BP 122/80    Pulse 87    Ht 5\' 6"  (1.676 m)    Wt 164 lb 8 oz (74.6 kg)    BMI 26.55 kg/m   Constitutional: Pleasant, generally well-appearing, no acute distress Psychiatric: alert and oriented x3, cooperative Eyes: extraocular movements intact, anicteric, conjunctiva pink Mouth: oral pharynx moist, no lesions Neck: supple no lymphadenopathy Cardiovascular: heart regular rate and rhythm. Lungs: clear to auscultation bilaterally Abdomen: soft, nontender, nondistended, no obvious ascites, no peritoneal signs, normal bowel sounds, no organomegaly Rectal: Deferred until colonoscopy Extremities: no clubbing, cyanosis, or lower extremity edema bilaterally Skin: no lesions on visible extremities Neuro: No focal deficits. No asterixis.     ASSESSMENT:  1.  Positive Cologuard testing 2.  History of iron deficiency anemia.  Improved on iron replacement 3.  B12 deficiency.  Now on replacement 4.  Multiple significant medical problems 5.  Chronic anticoagulation/PAF 6.  Diabetes mellitus 7.  Chronic lung disease.  As needed oxygen use A.  History of ulcer disease  PLAN:  1.  Colonoscopy  to evaluate history of iron deficiency anemia and positive Cologuard testing.  The patient is HIGH RISK given his comorbidities, lung disease, and the need to address chronic anticoagulation therapy.  We discussed the pros and cons of short-term discontinuation of anticoagulation therapy for his procedure.  We will have him hold his Eliquis 2 days prior to the procedure.  Also, hold diabetic medications to avoid unwanted hypoglycemia.  Due to his comorbid conditions, his examination will be set up at the hospital with monitored anesthesia care.The nature of the procedure, as well  as the risks, benefits, and alternatives were carefully and thoroughly reviewed with the patient. Ample time for discussion and questions allowed. The patient understood, was satisfied, and agreed to proceed. 2.  Continue B12 3.  Continue iron 4.  Continue PPI 5.  Continue ongoing medical care with PCP and multiple specialists A total time of 50 minutes was spent preparing to see the patient, reviewing laboratories and x-rays, reviewing cardiovascular studies, obtaining comprehensive history, performing comprehensive physical examination, counseling the patient and his wife regarding his multiple above listed issues, ordering advanced procedures in the hospital setting.  Finally, documenting clinical information in the health record

## 2021-05-01 ENCOUNTER — Telehealth: Payer: Self-pay | Admitting: Cardiovascular Disease

## 2021-05-01 ENCOUNTER — Other Ambulatory Visit: Payer: Self-pay | Admitting: Cardiovascular Disease

## 2021-05-01 ENCOUNTER — Other Ambulatory Visit: Payer: Self-pay | Admitting: Family

## 2021-05-01 NOTE — Telephone Encounter (Signed)
°*  STAT* If patient is at the pharmacy, call can be transferred to refill team.   1. Which medications need to be refilled? (please list name of each medication and dose if known) Torsemide  2. Which pharmacy/location (including street and city if local pharmacy) is medication to be sent to[facility] [place]  3. Do they need a 30 day or 90 day supply?  30 days and refills- need this asap, runs out tomorrow

## 2021-05-02 ENCOUNTER — Other Ambulatory Visit: Payer: Self-pay | Admitting: Cardiovascular Disease

## 2021-05-02 ENCOUNTER — Telehealth: Payer: Self-pay | Admitting: Family

## 2021-05-02 ENCOUNTER — Other Ambulatory Visit: Payer: Self-pay

## 2021-05-02 DIAGNOSIS — E1151 Type 2 diabetes mellitus with diabetic peripheral angiopathy without gangrene: Secondary | ICD-10-CM | POA: Diagnosis not present

## 2021-05-02 DIAGNOSIS — E1142 Type 2 diabetes mellitus with diabetic polyneuropathy: Secondary | ICD-10-CM | POA: Diagnosis not present

## 2021-05-02 DIAGNOSIS — J449 Chronic obstructive pulmonary disease, unspecified: Secondary | ICD-10-CM | POA: Diagnosis not present

## 2021-05-02 DIAGNOSIS — U071 COVID-19: Secondary | ICD-10-CM | POA: Diagnosis not present

## 2021-05-02 DIAGNOSIS — I110 Hypertensive heart disease with heart failure: Secondary | ICD-10-CM | POA: Diagnosis not present

## 2021-05-02 DIAGNOSIS — I5032 Chronic diastolic (congestive) heart failure: Secondary | ICD-10-CM | POA: Diagnosis not present

## 2021-05-02 MED ORDER — TORSEMIDE 40 MG PO TABS: 40.0000 mg | ORAL_TABLET | Freq: Every day | ORAL | 3 refills | Status: DC

## 2021-05-02 NOTE — Telephone Encounter (Signed)
Left message advising requested rx sent to pharmacy and to call back with any further questions or concerns.

## 2021-05-02 NOTE — Telephone Encounter (Signed)
Refill sent to your pharmacy.   Evelina Dun, FNP

## 2021-05-03 DIAGNOSIS — E1142 Type 2 diabetes mellitus with diabetic polyneuropathy: Secondary | ICD-10-CM | POA: Diagnosis not present

## 2021-05-03 DIAGNOSIS — U071 COVID-19: Secondary | ICD-10-CM | POA: Diagnosis not present

## 2021-05-03 DIAGNOSIS — J449 Chronic obstructive pulmonary disease, unspecified: Secondary | ICD-10-CM | POA: Diagnosis not present

## 2021-05-03 DIAGNOSIS — E1151 Type 2 diabetes mellitus with diabetic peripheral angiopathy without gangrene: Secondary | ICD-10-CM | POA: Diagnosis not present

## 2021-05-03 DIAGNOSIS — I5032 Chronic diastolic (congestive) heart failure: Secondary | ICD-10-CM | POA: Diagnosis not present

## 2021-05-03 DIAGNOSIS — I110 Hypertensive heart disease with heart failure: Secondary | ICD-10-CM | POA: Diagnosis not present

## 2021-05-03 MED ORDER — TORSEMIDE 20 MG PO TABS: ORAL_TABLET | ORAL | 1 refills | Status: DC

## 2021-05-03 NOTE — Telephone Encounter (Signed)
Patient aware and verbalized understanding. °

## 2021-05-03 NOTE — Telephone Encounter (Signed)
Prescription switched.

## 2021-05-07 ENCOUNTER — Ambulatory Visit (HOSPITAL_COMMUNITY)
Admission: RE | Admit: 2021-05-07 | Discharge: 2021-05-07 | Disposition: A | Discharge status: Home / Self Care | Payer: Medicare Other | Source: Ambulatory Visit | Attending: Vascular Surgery | Admitting: Vascular Surgery

## 2021-05-07 ENCOUNTER — Encounter: Payer: Self-pay | Admitting: Vascular Surgery

## 2021-05-07 ENCOUNTER — Ambulatory Visit (INDEPENDENT_AMBULATORY_CARE_PROVIDER_SITE_OTHER)
Admission: RE | Admit: 2021-05-07 | Discharge: 2021-05-07 | Disposition: A | Discharge status: Home / Self Care | Payer: Medicare Other | Source: Ambulatory Visit | Attending: Vascular Surgery | Admitting: Vascular Surgery

## 2021-05-07 ENCOUNTER — Other Ambulatory Visit: Payer: Self-pay

## 2021-05-07 ENCOUNTER — Ambulatory Visit (INDEPENDENT_AMBULATORY_CARE_PROVIDER_SITE_OTHER): Payer: Medicare Other | Admitting: Vascular Surgery

## 2021-05-07 VITALS — BP 115/68 | HR 95 | Temp 97.8°F | Resp 18 | Ht 66.0 in | Wt 160.0 lb

## 2021-05-07 DIAGNOSIS — I739 Peripheral vascular disease, unspecified: Secondary | ICD-10-CM | POA: Insufficient documentation

## 2021-05-07 NOTE — Progress Notes (Signed)
Patient name: John [name] MRN: [id number] DOB: 01/13/1946 Sex: male  REASON FOR VISIT: Evaluate new lower extremity claudication  HPI: John [name] is a 76 y.o. male with multiple medical comorbidities including hypertension, hyperlipidemia, diabetes, COPD, CHF the presents for evaluation of worsening lower extremity claudication.  He is well-known to our practice and previously underwent right common femoral endarterectomy with profundoplasty and bovine patch angioplasty with a right common iliac stent on Aug 20, 2020 for short distance lifestyle limiting claudication.  He initially was doing well after surgery.  He states he is now having pain in his hips as well as down into the thighs and calves.  This is worse in the right leg.  States he cannot walk to the front of our office without stopping.  He has back to smoking half a pack a day.  He is on Eliquis.  Past Medical History:  Diagnosis Date   Adenocarcinoma of lung, right ([place]) 04/18/2016   Anxiety    Arthritis    Asthma    BPH (benign prostatic hyperplasia)    with urinary retention 02/06/20   CHF (congestive heart failure) (HCC)    COPD (chronic obstructive pulmonary disease) ([place])    Depression    Diabetes mellitus without complication ([place])    no meds   Diabetes mellitus, type II ([place])    DM (diabetes mellitus) ([place]) 07/09/2016   Dyspnea    History of kidney stones    History of radiation therapy 06/25/16-08/20/16   right lung   History of radiation therapy 09/17/2020   right lung 09/10/2020-09/17/2020   Dr Sondra Come   Hyperlipidemia    Hypertension    Hypertension    Hypothyroidism    Macular degeneration    Neuropathy    Non-small cell lung cancer, right ([place]) 04/18/2016   Peripheral vascular disease ([place])    Prostatitis    Pulmonary nodule, left 07/16/2016   Sleep apnea    cpap    Past Surgical History:  Procedure Laterality Date   ABDOMINAL AORTOGRAM W/LOWER EXTREMITY Left 02/06/2020   Procedure:  ABDOMINAL AORTOGRAM W/LOWER EXTREMITY;  Surgeon: Lorretta Harp, MD;  Location: [facility];  Service: Cardiovascular;  Laterality: Left;   CARDIOVERSION N/A 10/05/2020   Procedure: CARDIOVERSION;  Surgeon: Geralynn Rile, MD;  Location: [place];  Service: Cardiovascular;  Laterality: N/A;   CATARACT EXTRACTION, BILATERAL Bilateral    ENDARTERECTOMY FEMORAL Right 08/20/2020   Procedure: ENDARTERECTOMY  RIGHT FEMORAL ARTERY;  Surgeon: Marty Heck, MD;  Location: [place];  Service: Vascular;  Laterality: Right;   ESOPHAGOGASTRODUODENOSCOPY (EGD) WITH PROPOFOL N/A 09/06/2020   Procedure: ESOPHAGOGASTRODUODENOSCOPY (EGD) WITH PROPOFOL;  Surgeon: Irene Shipper, MD;  Location: [facility];  Service: Endoscopy;  Laterality: N/A;   INSERTION OF ILIAC STENT Right 08/20/2020   Procedure: RETROGRADE INSERTION OF RIGHT ILIAC STENT;  Surgeon: Marty Heck, MD;  Location: [place];  Service: Vascular;  Laterality: Right;   INTRAOPERATIVE ARTERIOGRAM Right 08/20/2020   Procedure: INTRA OPERATIVE ARTERIOGRAM ILIAC;  Surgeon: Marty Heck, MD;  Location: [place];  Service: Vascular;  Laterality: Right;   PATCH ANGIOPLASTY Right 08/20/2020   Procedure: PATCH ANGIOPLASTY RIGHT FEMORAL ARTERY;  Surgeon: Marty Heck, MD;  Location: [place];  Service: Vascular;  Laterality: Right;   PORTACATH PLACEMENT Left 06/13/2016   Procedure: INSERTION PORT-A-CATH;  Surgeon: Aviva Signs, MD;  Location: [facility];  Service: General;  Laterality: Left;   TRANSURETHRAL RESECTION OF PROSTATE N/A 05/31/2020  Procedure: TRANSURETHRAL RESECTION OF THE PROSTATE (TURP);  Surgeon: Cleon Gustin, MD;  Location: [facility];  Service: Urology;  Laterality: N/A;   VIDEO BRONCHOSCOPY WITH ENDOBRONCHIAL NAVIGATION N/A 05/28/2016   Procedure: VIDEO BRONCHOSCOPY WITH ENDOBRONCHIAL NAVIGATION;  Surgeon: [name], MD;  Location: [facility];  Service: Thoracic;  Laterality: N/A;   VIDEO BRONCHOSCOPY WITH  ENDOBRONCHIAL ULTRASOUND N/A 05/28/2016   Procedure: VIDEO BRONCHOSCOPY WITH ENDOBRONCHIAL ULTRASOUND;  Surgeon: [name], MD;  Location: [facility];  Service: Thoracic;  Laterality: N/A;    Family History  Problem Relation Age of Onset   Hypertension Mother    Diabetes Father    Heart disease Father    Stroke Father    Hypertension Sister     SOCIAL HISTORY: Social History   Tobacco Use   Smoking status: Every Day    Packs/day: 0.50    Years: 55.00    Pack years: 27.50    Types: Cigarettes    Start date: 03/11/1961   Smokeless tobacco: Never   Tobacco comments:    1/2 pack to 1 pack per day 12/14/19  Substance Use Topics   Alcohol use: No    Allergies  Allergen Reactions   Sulfa Antibiotics Swelling    Mouth swelling   Atorvastatin Other (See Comments)    Muscle aches - tolerating Pravastatin 40 mg MWF   Jardiance [Empagliflozin] Other (See Comments)    FEELS SLUGGISH, TIRED   Lopressor [Metoprolol] Other (See Comments)    Fatigue   Rosuvastatin Other (See Comments)    Muscle aches - tolerating Pravastatin 40 mg MWF   Temazepam Other (See Comments)    Made insomnia worse     Current Outpatient Medications  Medication Sig Dispense Refill   albuterol (VENTOLIN HFA) 108 (90 Base) MCG/ACT inhaler Inhale 2 puffs into the lungs every 4 (four) hours as needed for wheezing or shortness of breath. 8 g 6   apixaban (ELIQUIS) 5 MG TABS tablet TAKE 1 TABLET BY MOUTH TWICE A DAY 60 tablet 5   Blood Glucose Monitoring Suppl (ONETOUCH VERIO REFLECT) w/Device KIT Use to test blood sugars daily as directed. DX: E11.9 1 kit 1   ezetimibe (ZETIA) 10 MG tablet Take 1 tablet (10 mg total) by mouth daily. 90 tablet 3   fluticasone (FLONASE) 50 MCG/ACT nasal spray PLACE 1 SPRAY INTO BOTH NOSTRILS DAILY AS NEEDED FOR ALLERGIES OR RHINITIS. 48 mL 3   Fluticasone-Umeclidin-Vilant (TRELEGY ELLIPTA) 100-62.5-25 MCG/INH AEPB Inhale 1 puff into the lungs daily. 1 each 11   gabapentin  (NEURONTIN) 300 MG capsule TAKE 1 CAPSULE BY MOUTH THREE TIMES A DAY 270 capsule 0   hydrOXYzine (VISTARIL) 25 MG capsule Take 1 capsule (25 mg total) by mouth 3 (three) times daily as needed for itching. 60 capsule 1   Iron, Ferrous Sulfate, 325 (65 Fe) MG TABS Take 325 mg by mouth 2 (two) times daily. 180 tablet 1   KLOR-CON M20 20 MEQ tablet TAKE 1 TABLET BY MOUTH EVERY DAY 90 tablet 0   levocetirizine (XYZAL) 5 MG tablet Take 1 tablet (5 mg total) by mouth at bedtime. 30 tablet 2   levothyroxine (SYNTHROID) 50 MCG tablet TAKE 1 TABLET BY MOUTH EVERY DAY 90 tablet 1   linaclotide (LINZESS) 145 MCG CAPS capsule Take 1 capsule (145 mcg total) by mouth daily. To regulate bowel movements 30 capsule 5   nebivolol (BYSTOLIC) 2.5 MG tablet Take 1 tablet (2.5 mg total) by mouth daily. 30 tablet 6  OneTouch Delica Lancets 37C MISC Use to test blood sugars daily as directed. DX: E11.9 100 each 1   ONETOUCH VERIO test strip USE TO TEST BLOOD SUGARS DAILY AS DIRECTED. DX: E11.9 100 strip 12   pantoprazole (PROTONIX) 40 MG tablet Take 1 tablet (40 mg total) by mouth daily. 90 tablet 3   pravastatin (PRAVACHOL) 40 MG tablet TAKE 1 TABLET (40 MG TOTAL) BY MOUTH ON MONDAYS , WEDNESDAYS, AND FRIDAYS AS DIRECTED 48 tablet 3   Semaglutide (RYBELSUS) 3 MG TABS Take 3 mg by mouth daily. 90 tablet 1   torsemide (DEMADEX) 20 MG tablet Take 40 mg by mouth daily. Can take an additional 12m in the afternoon as needed for weight gain (3lbs in 1 day or 5lbs in 1 week) or worsening lower extremity edema or shortness of breath. 180 tablet 1   triamcinolone cream (KENALOG) 0.1 % Apply 1 application topically 2 (two) times daily. 80 g 1   zolpidem (AMBIEN) 5 MG tablet Take 1 tablet (5 mg total) by mouth at bedtime as needed for sleep. 30 tablet 2   aspirin EC 81 MG tablet Take 1 tablet (81 mg total) by mouth daily. Swallow whole. (Patient not taking: Reported on 05/07/2021) 90 tablet 3   No current facility-administered  medications for this visit.    REVIEW OF SYSTEMS:  _0  denotes positive finding, _1  denotes negative finding Cardiac  Comments:  Chest pain or chest pressure:    Shortness of breath upon exertion:    Short of breath when lying flat:    Irregular heart rhythm:        Vascular    Pain in calf, thigh, or hip brought on by ambulation:    Pain in feet at night that wakes you up from your sleep:     Blood clot in your veins:    Leg swelling:         Pulmonary    Oxygen at home:    Productive cough:     Wheezing:         Neurologic    Sudden weakness in arms or legs:     Sudden numbness in arms or legs:     Sudden onset of difficulty speaking or slurred speech:    Temporary loss of vision in one eye:     Problems with dizziness:         Gastrointestinal    Blood in stool:     Vomited blood:         Genitourinary    Burning when urinating:     Blood in urine:        Psychiatric    Major depression:         Hematologic    Bleeding problems:    Problems with blood clotting too easily:        Skin    Rashes or ulcers:        Constitutional    Fever or chills:      PHYSICAL EXAM: Vitals:   05/07/21 1052  BP: 115/68  Pulse: 95  Resp: 18  Temp: 97.8 F (36.6 C)  TempSrc: Temporal  SpO2: 93%  Weight: 160 lb (72.6 kg)  Height: _2  (1.676 m)    GENERAL: The patient is a well-nourished male, in no acute distress. The vital signs are documented above. CARDIAC: There is a regular rate and rhythm.  VASCULAR:  Previous right groin incision well healed Palpable femoral pulses bilaterally No lower extremity  tissue loss and no palpable pedal pulses  DATA:   ABIs today are 0.69 on the right monophasic and 1.05 on the left monophasic  Aortoiliac duplex concern for recurrent >50% stenosis in the proximal right common iliac stent  Right lower extremity arterial duplex shows no flow-limiting stenosis in the right lower extremity  Assessment/Plan:  76 year old  male status post right common femoral endarterectomy with profundoplasty and bovine pericardial patch angioplasty and right common iliac stent on 08/20/2020 for short distance lifestyle limiting claudication.  He is now having recurrent lower extremity symptoms worse in the right leg consistent with his previous claudication symptoms.  His duplex is concerning for in-stent stenosis of the right common iliac stent and he is back to smoking.  I have counseled him on the importance of smoking cessation.  I will schedule him for aortogram with lower extremity arteriogram and possible intervention.  Discussed if we do not find anything that needs intervention this may all be related to arthritis etc.   Marty Heck, MD [facility]: [phone]

## 2021-05-10 DIAGNOSIS — J449 Chronic obstructive pulmonary disease, unspecified: Secondary | ICD-10-CM | POA: Diagnosis not present

## 2021-05-10 DIAGNOSIS — U071 COVID-19: Secondary | ICD-10-CM | POA: Diagnosis not present

## 2021-05-10 DIAGNOSIS — E1142 Type 2 diabetes mellitus with diabetic polyneuropathy: Secondary | ICD-10-CM | POA: Diagnosis not present

## 2021-05-10 DIAGNOSIS — E1151 Type 2 diabetes mellitus with diabetic peripheral angiopathy without gangrene: Secondary | ICD-10-CM | POA: Diagnosis not present

## 2021-05-10 DIAGNOSIS — I5032 Chronic diastolic (congestive) heart failure: Secondary | ICD-10-CM | POA: Diagnosis not present

## 2021-05-10 DIAGNOSIS — I110 Hypertensive heart disease with heart failure: Secondary | ICD-10-CM | POA: Diagnosis not present

## 2021-05-13 NOTE — Telephone Encounter (Signed)
Se 05/01/21 phone note

## 2021-05-14 ENCOUNTER — Other Ambulatory Visit: Payer: Self-pay

## 2021-05-14 DIAGNOSIS — E1151 Type 2 diabetes mellitus with diabetic peripheral angiopathy without gangrene: Secondary | ICD-10-CM | POA: Diagnosis not present

## 2021-05-14 DIAGNOSIS — E1142 Type 2 diabetes mellitus with diabetic polyneuropathy: Secondary | ICD-10-CM | POA: Diagnosis not present

## 2021-05-14 DIAGNOSIS — U071 COVID-19: Secondary | ICD-10-CM | POA: Diagnosis not present

## 2021-05-14 DIAGNOSIS — J449 Chronic obstructive pulmonary disease, unspecified: Secondary | ICD-10-CM | POA: Diagnosis not present

## 2021-05-14 DIAGNOSIS — I5032 Chronic diastolic (congestive) heart failure: Secondary | ICD-10-CM | POA: Diagnosis not present

## 2021-05-14 DIAGNOSIS — I110 Hypertensive heart disease with heart failure: Secondary | ICD-10-CM | POA: Diagnosis not present

## 2021-05-15 ENCOUNTER — Other Ambulatory Visit: Payer: Self-pay | Admitting: Family

## 2021-05-15 DIAGNOSIS — E785 Hyperlipidemia, unspecified: Secondary | ICD-10-CM

## 2021-05-15 DIAGNOSIS — E1169 Type 2 diabetes mellitus with other specified complication: Secondary | ICD-10-CM

## 2021-05-17 DIAGNOSIS — E1151 Type 2 diabetes mellitus with diabetic peripheral angiopathy without gangrene: Secondary | ICD-10-CM | POA: Diagnosis not present

## 2021-05-17 DIAGNOSIS — U071 COVID-19: Secondary | ICD-10-CM | POA: Diagnosis not present

## 2021-05-17 DIAGNOSIS — I5032 Chronic diastolic (congestive) heart failure: Secondary | ICD-10-CM | POA: Diagnosis not present

## 2021-05-17 DIAGNOSIS — E1142 Type 2 diabetes mellitus with diabetic polyneuropathy: Secondary | ICD-10-CM | POA: Diagnosis not present

## 2021-05-17 DIAGNOSIS — J449 Chronic obstructive pulmonary disease, unspecified: Secondary | ICD-10-CM | POA: Diagnosis not present

## 2021-05-17 DIAGNOSIS — I110 Hypertensive heart disease with heart failure: Secondary | ICD-10-CM | POA: Diagnosis not present

## 2021-05-20 ENCOUNTER — Telehealth: Payer: Self-pay | Admitting: *Deleted

## 2021-05-20 DIAGNOSIS — J449 Chronic obstructive pulmonary disease, unspecified: Secondary | ICD-10-CM | POA: Diagnosis not present

## 2021-05-20 DIAGNOSIS — I5032 Chronic diastolic (congestive) heart failure: Secondary | ICD-10-CM | POA: Diagnosis not present

## 2021-05-20 DIAGNOSIS — E1151 Type 2 diabetes mellitus with diabetic peripheral angiopathy without gangrene: Secondary | ICD-10-CM | POA: Diagnosis not present

## 2021-05-20 DIAGNOSIS — E1142 Type 2 diabetes mellitus with diabetic polyneuropathy: Secondary | ICD-10-CM | POA: Diagnosis not present

## 2021-05-20 DIAGNOSIS — U071 COVID-19: Secondary | ICD-10-CM | POA: Diagnosis not present

## 2021-05-20 DIAGNOSIS — I110 Hypertensive heart disease with heart failure: Secondary | ICD-10-CM | POA: Diagnosis not present

## 2021-05-20 NOTE — Telephone Encounter (Signed)
TC w/ Arvil [place] w/ [facility] W/ pt today, BS have steadily been going up yesterday 227 today 167, usually in low 100s -110s, blurred vision recently. All this since decreasing Rybelsus from 7 mg down to 1/2 tab. Pt did take a whole 7 mg yesterday & today Please advise

## 2021-05-21 DIAGNOSIS — E1151 Type 2 diabetes mellitus with diabetic peripheral angiopathy without gangrene: Secondary | ICD-10-CM | POA: Diagnosis not present

## 2021-05-21 DIAGNOSIS — E1142 Type 2 diabetes mellitus with diabetic polyneuropathy: Secondary | ICD-10-CM | POA: Diagnosis not present

## 2021-05-21 DIAGNOSIS — I5032 Chronic diastolic (congestive) heart failure: Secondary | ICD-10-CM | POA: Diagnosis not present

## 2021-05-21 DIAGNOSIS — I110 Hypertensive heart disease with heart failure: Secondary | ICD-10-CM | POA: Diagnosis not present

## 2021-05-21 DIAGNOSIS — J449 Chronic obstructive pulmonary disease, unspecified: Secondary | ICD-10-CM | POA: Diagnosis not present

## 2021-05-21 DIAGNOSIS — U071 COVID-19: Secondary | ICD-10-CM | POA: Diagnosis not present

## 2021-05-21 NOTE — Telephone Encounter (Signed)
Please schedule patient a follow up with me this week or next.   John Dun, FNP

## 2021-05-21 NOTE — Telephone Encounter (Signed)
Patient aware.

## 2021-05-21 NOTE — Telephone Encounter (Signed)
Appt made for Friday

## 2021-05-22 DIAGNOSIS — I110 Hypertensive heart disease with heart failure: Secondary | ICD-10-CM | POA: Diagnosis not present

## 2021-05-22 DIAGNOSIS — U071 COVID-19: Secondary | ICD-10-CM | POA: Diagnosis not present

## 2021-05-22 DIAGNOSIS — I5032 Chronic diastolic (congestive) heart failure: Secondary | ICD-10-CM | POA: Diagnosis not present

## 2021-05-22 DIAGNOSIS — E1142 Type 2 diabetes mellitus with diabetic polyneuropathy: Secondary | ICD-10-CM | POA: Diagnosis not present

## 2021-05-22 DIAGNOSIS — J449 Chronic obstructive pulmonary disease, unspecified: Secondary | ICD-10-CM | POA: Diagnosis not present

## 2021-05-22 DIAGNOSIS — E1151 Type 2 diabetes mellitus with diabetic peripheral angiopathy without gangrene: Secondary | ICD-10-CM | POA: Diagnosis not present

## 2021-05-24 ENCOUNTER — Ambulatory Visit (INDEPENDENT_AMBULATORY_CARE_PROVIDER_SITE_OTHER): Payer: Medicare Other | Admitting: Family

## 2021-05-24 ENCOUNTER — Encounter: Payer: Self-pay | Admitting: Family

## 2021-05-24 VITALS — BP 133/55 | HR 113 | Temp 98.1°F

## 2021-05-24 DIAGNOSIS — E1151 Type 2 diabetes mellitus with diabetic peripheral angiopathy without gangrene: Secondary | ICD-10-CM | POA: Diagnosis not present

## 2021-05-24 DIAGNOSIS — U071 COVID-19: Secondary | ICD-10-CM | POA: Diagnosis not present

## 2021-05-24 DIAGNOSIS — I5032 Chronic diastolic (congestive) heart failure: Secondary | ICD-10-CM | POA: Diagnosis not present

## 2021-05-24 DIAGNOSIS — J449 Chronic obstructive pulmonary disease, unspecified: Secondary | ICD-10-CM | POA: Diagnosis not present

## 2021-05-24 DIAGNOSIS — I110 Hypertensive heart disease with heart failure: Secondary | ICD-10-CM | POA: Diagnosis not present

## 2021-05-24 DIAGNOSIS — E1142 Type 2 diabetes mellitus with diabetic polyneuropathy: Secondary | ICD-10-CM | POA: Diagnosis not present

## 2021-05-24 DIAGNOSIS — I10 Essential (primary) hypertension: Secondary | ICD-10-CM | POA: Diagnosis not present

## 2021-05-24 LAB — BAYER DCA HB A1C WAIVED: HB A1C (BAYER DCA - WAIVED): 6.4 % — ABNORMAL HIGH (ref 4.8–5.6)

## 2021-05-24 MED ORDER — RYBELSUS 7 MG PO TABS: 7.0000 mg | ORAL_TABLET | Freq: Every day | ORAL | 1 refills | Status: DC

## 2021-05-24 NOTE — Progress Notes (Signed)
Subjective:    Patient ID: John [name], male    DOB: [birth year], 76 y.o.   MRN: [id number]  Chief Complaint  Patient presents with   Diabetes   PT presents to the office today to discuss elevated blood sugars. He reports since decreasing to 3 mg from 7 mg. He states since his BS have been high he restarted the 7 mg.  Diabetes He presents for his follow-up diabetic visit. He has type 2 diabetes mellitus. Associated symptoms include foot paresthesias. Pertinent negatives for diabetes include no blurred vision. Symptoms are stable. Risk factors for coronary artery disease include dyslipidemia, diabetes mellitus, male sex and hypertension. He is following a generally healthy diet. His overall blood glucose range is 180-200 mg/dl.  Hypertension This is a chronic problem. The current episode started more than 1 year ago. The problem has been resolved since onset. The problem is controlled. Associated symptoms include malaise/fatigue, peripheral edema and shortness of breath. Pertinent negatives include no blurred vision. Risk factors for coronary artery disease include dyslipidemia, diabetes mellitus, obesity, sedentary lifestyle and smoking/tobacco exposure. The current treatment provides moderate improvement. Hypertensive end-organ damage includes CAD/MI.     Review of Systems  Constitutional:  Positive for malaise/fatigue.  Eyes:  Negative for blurred vision.  Respiratory:  Positive for shortness of breath.   All other systems reviewed and are negative.     Objective:   Physical Exam Vitals reviewed.  Constitutional:      General: He is not in acute distress.    Appearance: He is well-developed. He is obese.  HENT:     Head: Normocephalic.     Right Ear: Tympanic membrane normal.     Left Ear: Tympanic membrane normal.  Eyes:     General:        Right eye: No discharge.        Left eye: No discharge.     Pupils: Pupils are equal, round, and reactive to light.  Neck:      Thyroid: No thyromegaly.  Cardiovascular:     Rate and Rhythm: Normal rate and regular rhythm.     Heart sounds: Normal heart sounds. No murmur heard. Pulmonary:     Effort: Pulmonary effort is normal. No respiratory distress.     Breath sounds: Normal breath sounds. No wheezing.  Abdominal:     General: Bowel sounds are normal. There is no distension.     Palpations: Abdomen is soft.     Tenderness: There is no abdominal tenderness.  Musculoskeletal:        General: No tenderness. Normal range of motion.     Cervical back: Normal range of motion and neck supple.  Skin:    General: Skin is warm and dry.     Findings: No erythema or rash.  Neurological:     Mental Status: He is alert and oriented to person, place, and time.     Cranial Nerves: No cranial nerve deficit.     Deep Tendon Reflexes: Reflexes are normal and symmetric.  Psychiatric:        Behavior: Behavior normal.        Thought Content: Thought content normal.        Judgment: Judgment normal.      BP (!) 133/55    Pulse (!) 113    Temp 98.1 F (36.7 C) (Temporal)   Assessment & Plan:   John [name] comes in today with chief complaint of Diabetes   Diagnosis and orders  addressed:  1. Type 2 diabetes mellitus with diabetic polyneuropathy, without long-term current use of insulin (HCC) - Bayer DCA Hb A1c Waived  2. Essential hypertension   Will continue Rybelsus at 7 mg  Low carb diet  Handicap form completed  Follow up in 3 months   Evelina Dun, FNP

## 2021-05-24 NOTE — Patient Instructions (Signed)

## 2021-05-26 DIAGNOSIS — I5032 Chronic diastolic (congestive) heart failure: Secondary | ICD-10-CM | POA: Diagnosis not present

## 2021-05-26 DIAGNOSIS — J449 Chronic obstructive pulmonary disease, unspecified: Secondary | ICD-10-CM | POA: Diagnosis not present

## 2021-05-26 DIAGNOSIS — Z8616 Personal history of COVID-19: Secondary | ICD-10-CM | POA: Diagnosis not present

## 2021-05-26 DIAGNOSIS — D630 Anemia in neoplastic disease: Secondary | ICD-10-CM | POA: Diagnosis not present

## 2021-05-26 DIAGNOSIS — F5101 Primary insomnia: Secondary | ICD-10-CM | POA: Diagnosis not present

## 2021-05-26 DIAGNOSIS — E1151 Type 2 diabetes mellitus with diabetic peripheral angiopathy without gangrene: Secondary | ICD-10-CM | POA: Diagnosis not present

## 2021-05-26 DIAGNOSIS — Z7901 Long term (current) use of anticoagulants: Secondary | ICD-10-CM | POA: Diagnosis not present

## 2021-05-26 DIAGNOSIS — E1142 Type 2 diabetes mellitus with diabetic polyneuropathy: Secondary | ICD-10-CM | POA: Diagnosis not present

## 2021-05-26 DIAGNOSIS — I4892 Unspecified atrial flutter: Secondary | ICD-10-CM | POA: Diagnosis not present

## 2021-05-26 DIAGNOSIS — N401 Enlarged prostate with lower urinary tract symptoms: Secondary | ICD-10-CM | POA: Diagnosis not present

## 2021-05-26 DIAGNOSIS — E1169 Type 2 diabetes mellitus with other specified complication: Secondary | ICD-10-CM | POA: Diagnosis not present

## 2021-05-26 DIAGNOSIS — R338 Other retention of urine: Secondary | ICD-10-CM | POA: Diagnosis not present

## 2021-05-26 DIAGNOSIS — F17210 Nicotine dependence, cigarettes, uncomplicated: Secondary | ICD-10-CM | POA: Diagnosis not present

## 2021-05-26 DIAGNOSIS — C3490 Malignant neoplasm of unspecified part of unspecified bronchus or lung: Secondary | ICD-10-CM | POA: Diagnosis not present

## 2021-05-26 DIAGNOSIS — H3530 Unspecified macular degeneration: Secondary | ICD-10-CM | POA: Diagnosis not present

## 2021-05-26 DIAGNOSIS — Z7902 Long term (current) use of antithrombotics/antiplatelets: Secondary | ICD-10-CM | POA: Diagnosis not present

## 2021-05-26 DIAGNOSIS — E039 Hypothyroidism, unspecified: Secondary | ICD-10-CM | POA: Diagnosis not present

## 2021-05-26 DIAGNOSIS — Z7985 Long-term (current) use of injectable non-insulin antidiabetic drugs: Secondary | ICD-10-CM | POA: Diagnosis not present

## 2021-05-26 DIAGNOSIS — E785 Hyperlipidemia, unspecified: Secondary | ICD-10-CM | POA: Diagnosis not present

## 2021-05-26 DIAGNOSIS — I110 Hypertensive heart disease with heart failure: Secondary | ICD-10-CM | POA: Diagnosis not present

## 2021-05-26 DIAGNOSIS — N138 Other obstructive and reflux uropathy: Secondary | ICD-10-CM | POA: Diagnosis not present

## 2021-05-26 DIAGNOSIS — G4730 Sleep apnea, unspecified: Secondary | ICD-10-CM | POA: Diagnosis not present

## 2021-05-26 DIAGNOSIS — K5900 Constipation, unspecified: Secondary | ICD-10-CM | POA: Diagnosis not present

## 2021-05-28 ENCOUNTER — Other Ambulatory Visit: Payer: Self-pay | Admitting: Family

## 2021-05-28 ENCOUNTER — Telehealth: Payer: Self-pay | Admitting: Internal Medicine

## 2021-05-28 DIAGNOSIS — B351 Tinea unguium: Secondary | ICD-10-CM | POA: Diagnosis not present

## 2021-05-28 DIAGNOSIS — I110 Hypertensive heart disease with heart failure: Secondary | ICD-10-CM | POA: Diagnosis not present

## 2021-05-28 DIAGNOSIS — E1142 Type 2 diabetes mellitus with diabetic polyneuropathy: Secondary | ICD-10-CM | POA: Diagnosis not present

## 2021-05-28 DIAGNOSIS — I5032 Chronic diastolic (congestive) heart failure: Secondary | ICD-10-CM | POA: Diagnosis not present

## 2021-05-28 DIAGNOSIS — J449 Chronic obstructive pulmonary disease, unspecified: Secondary | ICD-10-CM | POA: Diagnosis not present

## 2021-05-28 DIAGNOSIS — L84 Corns and callosities: Secondary | ICD-10-CM | POA: Diagnosis not present

## 2021-05-28 DIAGNOSIS — M79676 Pain in unspecified toe(s): Secondary | ICD-10-CM | POA: Diagnosis not present

## 2021-05-28 DIAGNOSIS — C3490 Malignant neoplasm of unspecified part of unspecified bronchus or lung: Secondary | ICD-10-CM | POA: Diagnosis not present

## 2021-05-28 DIAGNOSIS — E1151 Type 2 diabetes mellitus with diabetic peripheral angiopathy without gangrene: Secondary | ICD-10-CM | POA: Diagnosis not present

## 2021-05-28 NOTE — Telephone Encounter (Signed)
Spoke with pt and he is scheduled for a CT of chest on 3/15, pt wanted to make sure that it was ok for him to have the CT the day before the colon. Discussed with pt that it is a ct of chest and should be IV contrast only so that should not be an issue. Discussed with him that he should follow his dietary restrictions and prep instructions and he should be fine. Pt verbalized understanding.

## 2021-05-28 NOTE — Telephone Encounter (Signed)
Inbound call from patient states he having a colonoscopy 3/16 but have to get a CT with contrast on 3/15. Would like to know would that be okay and not interfere with procedure?

## 2021-05-29 DIAGNOSIS — I110 Hypertensive heart disease with heart failure: Secondary | ICD-10-CM | POA: Diagnosis not present

## 2021-05-29 DIAGNOSIS — C3490 Malignant neoplasm of unspecified part of unspecified bronchus or lung: Secondary | ICD-10-CM | POA: Diagnosis not present

## 2021-05-29 DIAGNOSIS — I5032 Chronic diastolic (congestive) heart failure: Secondary | ICD-10-CM | POA: Diagnosis not present

## 2021-05-29 DIAGNOSIS — E1142 Type 2 diabetes mellitus with diabetic polyneuropathy: Secondary | ICD-10-CM | POA: Diagnosis not present

## 2021-05-29 DIAGNOSIS — J449 Chronic obstructive pulmonary disease, unspecified: Secondary | ICD-10-CM | POA: Diagnosis not present

## 2021-05-29 DIAGNOSIS — E1151 Type 2 diabetes mellitus with diabetic peripheral angiopathy without gangrene: Secondary | ICD-10-CM | POA: Diagnosis not present

## 2021-05-30 ENCOUNTER — Encounter (HOSPITAL_COMMUNITY)
Admission: RE | Disposition: A | Discharge status: Home / Self Care | Payer: Self-pay | Source: Home / Self Care | Attending: Vascular Surgery

## 2021-05-30 ENCOUNTER — Other Ambulatory Visit: Payer: Self-pay

## 2021-05-30 ENCOUNTER — Other Ambulatory Visit: Payer: Self-pay | Admitting: Cardiovascular Disease

## 2021-05-30 ENCOUNTER — Ambulatory Visit (HOSPITAL_COMMUNITY)
Admission: RE | Admit: 2021-05-30 | Discharge: 2021-05-30 | Disposition: A | Discharge status: Home / Self Care | Payer: Medicare Other | Attending: Vascular Surgery | Admitting: Vascular Surgery

## 2021-05-30 DIAGNOSIS — E785 Hyperlipidemia, unspecified: Secondary | ICD-10-CM | POA: Diagnosis not present

## 2021-05-30 DIAGNOSIS — I110 Hypertensive heart disease with heart failure: Secondary | ICD-10-CM | POA: Diagnosis not present

## 2021-05-30 DIAGNOSIS — I509 Heart failure, unspecified: Secondary | ICD-10-CM | POA: Insufficient documentation

## 2021-05-30 DIAGNOSIS — J449 Chronic obstructive pulmonary disease, unspecified: Secondary | ICD-10-CM | POA: Diagnosis not present

## 2021-05-30 DIAGNOSIS — Z7901 Long term (current) use of anticoagulants: Secondary | ICD-10-CM | POA: Insufficient documentation

## 2021-05-30 DIAGNOSIS — E1151 Type 2 diabetes mellitus with diabetic peripheral angiopathy without gangrene: Secondary | ICD-10-CM | POA: Diagnosis not present

## 2021-05-30 DIAGNOSIS — F17210 Nicotine dependence, cigarettes, uncomplicated: Secondary | ICD-10-CM | POA: Diagnosis not present

## 2021-05-30 DIAGNOSIS — I70211 Atherosclerosis of native arteries of extremities with intermittent claudication, right leg: Secondary | ICD-10-CM | POA: Insufficient documentation

## 2021-05-30 HISTORY — PX: PERIPHERAL VASCULAR BALLOON ANGIOPLASTY: CATH118281

## 2021-05-30 HISTORY — PX: ABDOMINAL AORTOGRAM W/LOWER EXTREMITY: CATH118223

## 2021-05-30 LAB — POCT I-STAT, CHEM 8
BUN: 19 mg/dL (ref 8–23)
Calcium, Ion: 1.21 mmol/L (ref 1.15–1.40)
Chloride: 100 mmol/L (ref 98–111)
Creatinine, Ser: 1 mg/dL (ref 0.61–1.24)
Glucose, Bld: 205 mg/dL — ABNORMAL HIGH (ref 70–99)
HCT: 36 % — ABNORMAL LOW (ref 39.0–52.0)
Hemoglobin: 12.2 g/dL — ABNORMAL LOW (ref 13.0–17.0)
Potassium: 3.7 mmol/L (ref 3.5–5.1)
Sodium: 140 mmol/L (ref 135–145)
TCO2: 29 mmol/L (ref 22–32)

## 2021-05-30 LAB — POCT ACTIVATED CLOTTING TIME
Activated Clotting Time: 209 seconds
Activated Clotting Time: 185 seconds

## 2021-05-30 LAB — GLUCOSE, CAPILLARY
Glucose-Capillary: 133 mg/dL — ABNORMAL HIGH (ref 70–99)
Glucose-Capillary: 126 mg/dL — ABNORMAL HIGH (ref 70–99)

## 2021-05-30 SURGERY — ABDOMINAL AORTOGRAM W/LOWER EXTREMITY
Anesthesia: LOCAL | Laterality: Right

## 2021-05-30 MED ORDER — SODIUM CHLORIDE 0.9 % IV SOLN: 250.0000 mL | INTRAVENOUS | Status: DC | PRN

## 2021-05-30 MED ORDER — FENTANYL CITRATE (PF) 100 MCG/2ML IJ SOLN
INTRAMUSCULAR | Status: AC
  Filled 2021-05-30: qty 2

## 2021-05-30 MED ORDER — CLOPIDOGREL BISULFATE 75 MG PO TABS: 300.0000 mg | ORAL_TABLET | Freq: Once | ORAL | Status: DC

## 2021-05-30 MED ORDER — SODIUM CHLORIDE 0.9 % IV SOLN: INTRAVENOUS | Status: AC

## 2021-05-30 MED ORDER — HEPARIN SODIUM (PORCINE) 1000 UNIT/ML IJ SOLN
INTRAMUSCULAR | Status: AC
  Filled 2021-05-30: qty 10

## 2021-05-30 MED ORDER — SODIUM CHLORIDE 0.9% FLUSH: 3.0000 mL | INTRAVENOUS | Status: DC | PRN

## 2021-05-30 MED ORDER — CLOPIDOGREL BISULFATE 75 MG PO TABS: 75.0000 mg | ORAL_TABLET | Freq: Every day | ORAL | 0 refills | Status: DC

## 2021-05-30 MED ORDER — SODIUM CHLORIDE 0.9% FLUSH: 3.0000 mL | Freq: Two times a day (BID) | INTRAVENOUS | Status: DC

## 2021-05-30 MED ORDER — CLOPIDOGREL BISULFATE 75 MG PO TABS: 75.0000 mg | ORAL_TABLET | Freq: Every day | ORAL | Status: DC

## 2021-05-30 MED ORDER — HEPARIN (PORCINE) IN NACL 1000-0.9 UT/500ML-% IV SOLN
INTRAVENOUS | Status: AC
  Filled 2021-05-30: qty 1000

## 2021-05-30 MED ORDER — MIDAZOLAM HCL 2 MG/2ML IJ SOLN
INTRAMUSCULAR | Status: DC | PRN
  Administered 2021-05-30: 1 mg via INTRAVENOUS

## 2021-05-30 MED ORDER — ACETAMINOPHEN 325 MG PO TABS: 650.0000 mg | ORAL_TABLET | ORAL | Status: DC | PRN

## 2021-05-30 MED ORDER — SODIUM CHLORIDE 0.9 % IV SOLN: INTRAVENOUS | Status: DC

## 2021-05-30 MED ORDER — LIDOCAINE HCL (PF) 1 % IJ SOLN
INTRAMUSCULAR | Status: AC
  Filled 2021-05-30: qty 30

## 2021-05-30 MED ORDER — IODIXANOL 320 MG/ML IV SOLN
INTRAVENOUS | Status: DC | PRN
  Administered 2021-05-30: 130 mL via INTRA_ARTERIAL

## 2021-05-30 MED ORDER — CLOPIDOGREL BISULFATE 300 MG PO TABS
ORAL_TABLET | ORAL | Status: DC | PRN
  Administered 2021-05-30: 300 mg via ORAL

## 2021-05-30 MED ORDER — HYDRALAZINE HCL 20 MG/ML IJ SOLN: 5.0000 mg | INTRAMUSCULAR | Status: DC | PRN

## 2021-05-30 MED ORDER — HEPARIN SODIUM (PORCINE) 1000 UNIT/ML IJ SOLN
INTRAMUSCULAR | Status: DC | PRN
  Administered 2021-05-30: 8000 [IU] via INTRAVENOUS

## 2021-05-30 MED ORDER — LIDOCAINE HCL (PF) 1 % IJ SOLN
INTRAMUSCULAR | Status: DC | PRN
  Administered 2021-05-30: 18 mL via INTRADERMAL

## 2021-05-30 MED ORDER — MIDAZOLAM HCL 2 MG/2ML IJ SOLN
INTRAMUSCULAR | Status: AC
  Filled 2021-05-30: qty 2

## 2021-05-30 MED ORDER — ONDANSETRON HCL 4 MG/2ML IJ SOLN: 4.0000 mg | Freq: Four times a day (QID) | INTRAMUSCULAR | Status: DC | PRN

## 2021-05-30 MED ORDER — CLOPIDOGREL BISULFATE 300 MG PO TABS
ORAL_TABLET | ORAL | Status: AC
  Filled 2021-05-30: qty 1

## 2021-05-30 MED ORDER — HEPARIN (PORCINE) IN NACL 1000-0.9 UT/500ML-% IV SOLN
INTRAVENOUS | Status: DC | PRN
  Administered 2021-05-30 (×2): 500 mL

## 2021-05-30 MED ORDER — FENTANYL CITRATE (PF) 100 MCG/2ML IJ SOLN
INTRAMUSCULAR | Status: DC | PRN
  Administered 2021-05-30: 25 ug via INTRAVENOUS

## 2021-05-30 SURGICAL SUPPLY — 21 items
CATH OMNI FLUSH 5F 65CM (CATHETERS) ×1 IMPLANT
CATH QUICKCROSS SUPP .035X90CM (MICROCATHETER) ×1 IMPLANT
DCB RANGER 4.0X40 135 (BALLOONS) IMPLANT
DCB RANGER 4.0X80 135 (BALLOONS) IMPLANT
DCB RANGER 6.0X40 135 (BALLOONS) IMPLANT
DEVICE CONTINUOUS FLUSH (MISCELLANEOUS) ×1 IMPLANT
GLIDEWIRE ADV .035X260CM (WIRE) ×1 IMPLANT
KIT ENCORE 26 ADVANTAGE (KITS) ×1 IMPLANT
KIT MICROPUNCTURE NIT STIFF (SHEATH) ×1 IMPLANT
KIT PV (KITS) ×3 IMPLANT
RANGER DCB 4.0X40 135 (BALLOONS) ×3
RANGER DCB 4.0X80 135 (BALLOONS) ×6
RANGER DCB 6.0X40 135 (BALLOONS) ×3
SHEATH FLEX ANSEL ANG 6F 45CM (SHEATH) ×1 IMPLANT
SHEATH PINNACLE 5F 10CM (SHEATH) ×1 IMPLANT
SHEATH PROBE COVER 6X72 (BAG) ×1 IMPLANT
SYR MEDRAD MARK V 150ML (SYRINGE) ×1 IMPLANT
TRANSDUCER W/STOPCOCK (MISCELLANEOUS) ×3 IMPLANT
TRAY PV CATH (CUSTOM PROCEDURE TRAY) ×3 IMPLANT
WIRE BENTSON .035X145CM (WIRE) ×1 IMPLANT
WIRE G V18X300CM (WIRE) ×1 IMPLANT

## 2021-05-30 NOTE — Discharge Instructions (Addendum)
Please wait 2 days to resume Eliquis.  New prescription for plavix for 30 days after procedure.  May remove right calf dressing tomorrow.  Call us if any worsening calf pain.

## 2021-05-30 NOTE — H&P (Signed)
History and Physical Interval Note:  05/30/2021 9:38 AM  John [name]  has presented today for surgery, with the diagnosis of pad.  The various methods of treatment have been discussed with the patient and family. After consideration of risks, benefits and other options for treatment, the patient has consented to  Procedure(s): ABDOMINAL AORTOGRAM W/LOWER EXTREMITY (N/A) as a surgical intervention.  The patient's history has been reviewed, patient examined, no change in status, stable for surgery.  I have reviewed the patient's chart and labs.  Questions were answered to the patient's satisfaction.     Marty Heck  Patient name: John [name]        MRN: [id number]        DOB: 06-May-1945          Sex: male   REASON FOR VISIT: Evaluate new lower extremity claudication   HPI: John [name] is a 76 y.o. male with multiple medical comorbidities including hypertension, hyperlipidemia, diabetes, COPD, CHF the presents for evaluation of worsening lower extremity claudication.  He is well-known to our practice and previously underwent right common femoral endarterectomy with profundoplasty and bovine patch angioplasty with a right common iliac stent on Aug 20, 2020 for short distance lifestyle limiting claudication.  He initially was doing well after surgery.   He states he is now having pain in his hips as well as down into the thighs and calves.  This is worse in the right leg.  States he cannot walk to the front of our office without stopping.  He has back to smoking half a pack a day.  He is on Eliquis.       Past Medical History:  Diagnosis Date   Adenocarcinoma of lung, right ([facility]) 04/18/2016   Anxiety     Arthritis     Asthma     BPH (benign prostatic hyperplasia)      with urinary retention 02/06/20   CHF (congestive heart failure) ([facility])     COPD (chronic obstructive pulmonary disease) ([place])     Depression     Diabetes mellitus without complication ([place])      no meds    Diabetes mellitus, type II ([place])     DM (diabetes mellitus) ([place]) 07/09/2016   Dyspnea     History of kidney stones     History of radiation therapy 06/25/16-08/20/16    right lung   History of radiation therapy 09/17/2020    right lung 09/10/2020-09/17/2020   Dr Sondra Come   Hyperlipidemia     Hypertension     Hypertension     Hypothyroidism     Macular degeneration     Neuropathy     Non-small cell lung cancer, right ([place]) 04/18/2016   Peripheral vascular disease ([place])     Prostatitis     Pulmonary nodule, left 07/16/2016   Sleep apnea      cpap           Past Surgical History:  Procedure Laterality Date   ABDOMINAL AORTOGRAM W/LOWER EXTREMITY Left 02/06/2020    Procedure: ABDOMINAL AORTOGRAM W/LOWER EXTREMITY;  Surgeon: Lorretta Harp, MD;  Location: [facility];  Service: Cardiovascular;  Laterality: Left;   CARDIOVERSION N/A 10/05/2020    Procedure: CARDIOVERSION;  Surgeon: Geralynn Rile, MD;  Location: [facility];  Service: Cardiovascular;  Laterality: N/A;   CATARACT EXTRACTION, BILATERAL Bilateral     ENDARTERECTOMY FEMORAL Right 08/20/2020    Procedure: ENDARTERECTOMY  RIGHT FEMORAL ARTERY;  Surgeon: Carlis Abbott,  Gwenyth Allegra, MD;  Location: [facility] OR;  Service: Vascular;  Laterality: Right;   ESOPHAGOGASTRODUODENOSCOPY (EGD) WITH PROPOFOL N/A 09/06/2020    Procedure: ESOPHAGOGASTRODUODENOSCOPY (EGD) WITH PROPOFOL;  Surgeon: Irene Shipper, MD;  Location: [facility];  Service: Endoscopy;  Laterality: N/A;   INSERTION OF ILIAC STENT Right 08/20/2020    Procedure: RETROGRADE INSERTION OF RIGHT ILIAC STENT;  Surgeon: Marty Heck, MD;  Location: [facility];  Service: Vascular;  Laterality: Right;   INTRAOPERATIVE ARTERIOGRAM Right 08/20/2020    Procedure: INTRA OPERATIVE ARTERIOGRAM ILIAC;  Surgeon: Marty Heck, MD;  Location: [place];  Service: Vascular;  Laterality: Right;   PATCH ANGIOPLASTY Right 08/20/2020    Procedure: PATCH ANGIOPLASTY RIGHT FEMORAL ARTERY;   Surgeon: Marty Heck, MD;  Location: [place];  Service: Vascular;  Laterality: Right;   PORTACATH PLACEMENT Left 06/13/2016    Procedure: INSERTION PORT-A-CATH;  Surgeon: Aviva Signs, MD;  Location: [facility];  Service: General;  Laterality: Left;   TRANSURETHRAL RESECTION OF PROSTATE N/A 05/31/2020    Procedure: TRANSURETHRAL RESECTION OF THE PROSTATE (TURP);  Surgeon: Cleon Gustin, MD;  Location: [facility];  Service: Urology;  Laterality: N/A;   VIDEO BRONCHOSCOPY WITH ENDOBRONCHIAL NAVIGATION N/A 05/28/2016    Procedure: VIDEO BRONCHOSCOPY WITH ENDOBRONCHIAL NAVIGATION;  Surgeon: [name], MD;  Location: [facility];  Service: Thoracic;  Laterality: N/A;   VIDEO BRONCHOSCOPY WITH ENDOBRONCHIAL ULTRASOUND N/A 05/28/2016    Procedure: VIDEO BRONCHOSCOPY WITH ENDOBRONCHIAL ULTRASOUND;  Surgeon: [name], MD;  Location: [facility] OR;  Service: Thoracic;  Laterality: N/A;           Family History  Problem Relation Age of Onset   Hypertension Mother     Diabetes Father     Heart disease Father     Stroke Father     Hypertension Sister        SOCIAL HISTORY: Social History         Tobacco Use   Smoking status: Every Day      Packs/day: 0.50      Years: 55.00      Pack years: 27.50      Types: Cigarettes      Start date: 03/11/1961   Smokeless tobacco: Never   Tobacco comments:      1/2 pack to 1 pack per day 12/14/19  Substance Use Topics   Alcohol use: No           Allergies  Allergen Reactions   Sulfa Antibiotics Swelling      Mouth swelling   Atorvastatin Other (See Comments)      Muscle aches - tolerating Pravastatin 40 mg MWF   Jardiance [Empagliflozin] Other (See Comments)      FEELS SLUGGISH, TIRED   Lopressor [Metoprolol] Other (See Comments)      Fatigue   Rosuvastatin Other (See Comments)      Muscle aches - tolerating Pravastatin 40 mg MWF   Temazepam Other (See Comments)      Made insomnia worse             Current Outpatient Medications   Medication Sig Dispense Refill   albuterol (VENTOLIN HFA) 108 (90 Base) MCG/ACT inhaler Inhale 2 puffs into the lungs every 4 (four) hours as needed for wheezing or shortness of breath. 8 g 6   apixaban (ELIQUIS) 5 MG TABS tablet TAKE 1 TABLET BY MOUTH TWICE A DAY 60 tablet 5   Blood Glucose Monitoring Suppl (ONETOUCH VERIO REFLECT) w/Device KIT  Use to test blood sugars daily as directed. DX: E11.9 1 kit 1   ezetimibe (ZETIA) 10 MG tablet Take 1 tablet (10 mg total) by mouth daily. 90 tablet 3   fluticasone (FLONASE) 50 MCG/ACT nasal spray PLACE 1 SPRAY INTO BOTH NOSTRILS DAILY AS NEEDED FOR ALLERGIES OR RHINITIS. 48 mL 3   Fluticasone-Umeclidin-Vilant (TRELEGY ELLIPTA) 100-62.5-25 MCG/INH AEPB Inhale 1 puff into the lungs daily. 1 each 11   gabapentin (NEURONTIN) 300 MG capsule TAKE 1 CAPSULE BY MOUTH THREE TIMES A DAY 270 capsule 0   hydrOXYzine (VISTARIL) 25 MG capsule Take 1 capsule (25 mg total) by mouth 3 (three) times daily as needed for itching. 60 capsule 1   Iron, Ferrous Sulfate, 325 (65 Fe) MG TABS Take 325 mg by mouth 2 (two) times daily. 180 tablet 1   KLOR-CON M20 20 MEQ tablet TAKE 1 TABLET BY MOUTH EVERY DAY 90 tablet 0   levocetirizine (XYZAL) 5 MG tablet Take 1 tablet (5 mg total) by mouth at bedtime. 30 tablet 2   levothyroxine (SYNTHROID) 50 MCG tablet TAKE 1 TABLET BY MOUTH EVERY DAY 90 tablet 1   linaclotide (LINZESS) 145 MCG CAPS capsule Take 1 capsule (145 mcg total) by mouth daily. To regulate bowel movements 30 capsule 5   nebivolol (BYSTOLIC) 2.5 MG tablet Take 1 tablet (2.5 mg total) by mouth daily. 30 tablet 6   OneTouch Delica Lancets 92E MISC Use to test blood sugars daily as directed. DX: E11.9 100 each 1   ONETOUCH VERIO test strip USE TO TEST BLOOD SUGARS DAILY AS DIRECTED. DX: E11.9 100 strip 12   pantoprazole (PROTONIX) 40 MG tablet Take 1 tablet (40 mg total) by mouth daily. 90 tablet 3   pravastatin (PRAVACHOL) 40 MG tablet TAKE 1 TABLET (40 MG TOTAL) BY  MOUTH ON MONDAYS , WEDNESDAYS, AND FRIDAYS AS DIRECTED 48 tablet 3   Semaglutide (RYBELSUS) 3 MG TABS Take 3 mg by mouth daily. 90 tablet 1   torsemide (DEMADEX) 20 MG tablet Take 40 mg by mouth daily. Can take an additional 55m in the afternoon as needed for weight gain (3lbs in 1 day or 5lbs in 1 week) or worsening lower extremity edema or shortness of breath. 180 tablet 1   triamcinolone cream (KENALOG) 0.1 % Apply 1 application topically 2 (two) times daily. 80 g 1   zolpidem (AMBIEN) 5 MG tablet Take 1 tablet (5 mg total) by mouth at bedtime as needed for sleep. 30 tablet 2   aspirin EC 81 MG tablet Take 1 tablet (81 mg total) by mouth daily. Swallow whole. (Patient not taking: Reported on 05/07/2021) 90 tablet 3    No current facility-administered medications for this visit.      REVIEW OF SYSTEMS:  [X] denotes positive finding, [ ] denotes negative finding Cardiac   Comments:  Chest pain or chest pressure:      Shortness of breath upon exertion:      Short of breath when lying flat:      Irregular heart rhythm:             Vascular      Pain in calf, thigh, or hip brought on by ambulation:      Pain in feet at night that wakes you up from your sleep:       Blood clot in your veins:      Leg swelling:              Pulmonary  Oxygen at home:      Productive cough:       Wheezing:              Neurologic      Sudden weakness in arms or legs:       Sudden numbness in arms or legs:       Sudden onset of difficulty speaking or slurred speech:      Temporary loss of vision in one eye:       Problems with dizziness:              Gastrointestinal      Blood in stool:       Vomited blood:              Genitourinary      Burning when urinating:       Blood in urine:             Psychiatric      Major depression:              Hematologic      Bleeding problems:      Problems with blood clotting too easily:             Skin      Rashes or ulcers:              Constitutional      Fever or chills:          PHYSICAL EXAM:    Vitals:    05/07/21 1052  BP: 115/68  Pulse: 95  Resp: 18  Temp: 97.8 F (36.6 C)  TempSrc: Temporal  SpO2: 93%  Weight: 160 lb (72.6 kg)  Height: 5' 6" (1.676 m)      GENERAL: The patient is a well-nourished male, in no acute distress. The vital signs are documented above. CARDIAC: There is a regular rate and rhythm.  VASCULAR:  Previous right groin incision well healed Palpable femoral pulses bilaterally No lower extremity tissue loss and no palpable pedal pulses   DATA:    ABIs today are 0.69 on the right monophasic and 1.05 on the left monophasic   Aortoiliac duplex concern for recurrent >50% stenosis in the proximal right common iliac stent   Right lower extremity arterial duplex shows no flow-limiting stenosis in the right lower extremity   Assessment/Plan:   76 year old male status post right common femoral endarterectomy with profundoplasty and bovine pericardial patch angioplasty and right common iliac stent on 08/20/2020 for short distance lifestyle limiting claudication.  He is now having recurrent lower extremity symptoms worse in the right leg consistent with his previous claudication symptoms.  His duplex is concerning for in-stent stenosis of the right common iliac stent and he is back to smoking.  I have counseled him on the importance of smoking cessation.  I will schedule him for aortogram with lower extremity arteriogram and possible intervention.  Discussed if we do not find anything that needs intervention this may all be related to arthritis etc.     Marty Heck, MD [facility]: [phone]

## 2021-05-30 NOTE — Op Note (Signed)
Patient name: John [name] MRN: [id number] DOB: 1945-04-25 Sex: male  05/30/2021 Pre-operative Diagnosis: Recurrent right lower extremity short distance lifestyle limiting claudication Post-operative diagnosis:  Same Surgeon:  Marty Heck, MD Procedure Performed: 1.  Ultrasound-guided access left common femoral artery 2.  Aortogram including catheter selection of aorta 3.  Bilateral lower extremity arteriogram with runoff including selection third order branches right lower extremity 4.  Right proximal SFA, distal SFA, and above-knee popliteal artery angioplasty with drug-coated balloon (4 mm x 80 mm drug-coated Ranger and 4 mm x 40 mm drug-coated Ranger) 5.  Right proximal common iliac artery angioplasty with drug-coated balloon (6 mm x 40 mm drug-coated Ranger) 6.  61 minutes of monitored moderate conscious sedation time  Indications: 76 year old male previously underwent a right common femoral endarterectomy with retrograde common iliac stent for lifestyle limiting short distance claudication.  He had recurrent symptoms and duplex suggested some recurrent stenosis in the proximal common iliac stent.  He presents today for aortogram with lower extremity arteriogram and possible intervention after risks and benefits discussed.  Findings:   Aortogram showed that the distal abdominal aorta is diseased with less than 50% stenosis.  The right common iliac artery stent was widely patent but there was some very focal disease in the right common iliac just proximal to the stent adjacent to the bifurcation.  The right external iliac stent was widely patent.  The right common femoral endarterectomy was widely patent.  He had a focal high-grade stenosis greater than 80% in the proximal SFA and then a second high-grade stenosis 80% in the distal SFA at Hunter's canal into the above-knee popliteal artery.  Patient had three-vessel runoff distally with a known 80% stenosis in the TP trunk.     Ultimately the right SFA lesions were all crossed from left groin access and treated with a 4 mm drug-coated balloon angioplasty to 3 minutes at nominal pressure.  We then elected to treat the right common iliac stenosis with a 6 mm drug-coated Ranger as I did not feel there was enough room to extend the stent more proximal.  Excellent results with no significant residual stenosis and preserved runoff in the right lower extremity.  There was a visualized perforation off a peroneal branch likely from a wire.  Will wrap the calf and watch in recovery.  Brisk right DP/PT signals at completion.       Procedure:  The patient was identified in the holding area and taken to room 8.  The patient was then placed supine on the table and prepped and draped in the usual sterile fashion.  A time out was called.  Ultrasound was used to evaluate the left common femoral artery.  It was patent .  A digital ultrasound image was acquired.  A micropuncture needle was used to access the left common femoral artery under ultrasound guidance.  An 018 wire was advanced without resistance and a micropuncture sheath was placed.  The 018 wire was removed and a benson wire was placed.  The micropuncture sheath was exchanged for a 5 french sheath.  An omniflush catheter was advanced over the wire to the level of L-1.  An abdominal angiogram was obtained.  Next bilateral lower extremity runoff was obtained.  Pertinent findings are noted above.  Ultimately we visualized multiple high-grade stenosis in the proximal and distal right SFA as well as a stenosis proximal to his right common iliac stent.  Then used an Omni Flush catheter with a  Glidewire advantage to cross the aortic bifurcation and get a long 6 French sheath into the right common femoral artery.  Patient was given 100 units/kg IV heparin.  Then used a V18 with a quick cross catheter to get down the SFA antegrade and across all the stenosis.  Initially treated the distal SFA  above-knee popliteal stenosis with a 4 mm x 80 mm drug-coated Ranger to nominal pressure for 3 minutes with excellent results and no residual stenosis.  Then treated the proximal SFA with a 4 mm x 40 mm drug-coated Ranger to nominal pressure for 3 minutes with excellent results with no residual stenosis.  I looked at the disease in the proximal right common iliac artery and felt there was no room to extend the stent more proximally without stenting into the aorta or raising the bifurcation.  I elected to use a drug-coated balloon instead and used a 6 mm x 40 mm drug-coated Ranger overinflated with excellent results after 3 minutes insufflation time.  Preserved runoff in the right lower extremity.  We did note a perforation at the end of the case off a branch of peroneal artery likely from our wire that got out the branch.  We will monitor in recovery but has no calf pain at this time and wrapped calf with gentle ace wrap.  Taken to holding to have left groin sheath removed.  Condition: Stable   Marty Heck, MD [facility] [place] Office: [phone]

## 2021-05-30 NOTE — Progress Notes (Signed)
Site area: left groin arterial sheath Site Prior to Removal:  Level 0 Pressure Applied For: 20 minutes Manual:   yes Patient Status During Pull:  stable Post Pull Site:  Level 0 Post Pull Instructions Given:  yes Post Pull Pulses Present: left dp dopplered Dressing Applied:  gauze and tegaderm Bedrest begins @ 6886 Comments:

## 2021-05-31 ENCOUNTER — Ambulatory Visit: Payer: Medicaid Other | Admitting: Family

## 2021-05-31 ENCOUNTER — Encounter (HOSPITAL_COMMUNITY): Payer: Self-pay | Admitting: Vascular Surgery

## 2021-05-31 DIAGNOSIS — E1142 Type 2 diabetes mellitus with diabetic polyneuropathy: Secondary | ICD-10-CM | POA: Diagnosis not present

## 2021-05-31 DIAGNOSIS — I5032 Chronic diastolic (congestive) heart failure: Secondary | ICD-10-CM | POA: Diagnosis not present

## 2021-05-31 DIAGNOSIS — C3490 Malignant neoplasm of unspecified part of unspecified bronchus or lung: Secondary | ICD-10-CM | POA: Diagnosis not present

## 2021-05-31 DIAGNOSIS — I110 Hypertensive heart disease with heart failure: Secondary | ICD-10-CM | POA: Diagnosis not present

## 2021-05-31 DIAGNOSIS — J449 Chronic obstructive pulmonary disease, unspecified: Secondary | ICD-10-CM | POA: Diagnosis not present

## 2021-05-31 DIAGNOSIS — E1151 Type 2 diabetes mellitus with diabetic peripheral angiopathy without gangrene: Secondary | ICD-10-CM | POA: Diagnosis not present

## 2021-06-04 DIAGNOSIS — E1151 Type 2 diabetes mellitus with diabetic peripheral angiopathy without gangrene: Secondary | ICD-10-CM | POA: Diagnosis not present

## 2021-06-04 DIAGNOSIS — C3490 Malignant neoplasm of unspecified part of unspecified bronchus or lung: Secondary | ICD-10-CM | POA: Diagnosis not present

## 2021-06-04 DIAGNOSIS — E1142 Type 2 diabetes mellitus with diabetic polyneuropathy: Secondary | ICD-10-CM | POA: Diagnosis not present

## 2021-06-04 DIAGNOSIS — I5032 Chronic diastolic (congestive) heart failure: Secondary | ICD-10-CM | POA: Diagnosis not present

## 2021-06-04 DIAGNOSIS — I110 Hypertensive heart disease with heart failure: Secondary | ICD-10-CM | POA: Diagnosis not present

## 2021-06-04 DIAGNOSIS — J449 Chronic obstructive pulmonary disease, unspecified: Secondary | ICD-10-CM | POA: Diagnosis not present

## 2021-06-05 ENCOUNTER — Telehealth: Payer: Self-pay | Admitting: Family

## 2021-06-06 MED ORDER — BACLOFEN 10 MG PO TABS
5.0000 mg | ORAL_TABLET | Freq: Two times a day (BID) | ORAL | 0 refills | Status: DC | PRN
Start: 2021-06-06 — End: 2021-06-17

## 2021-06-06 NOTE — Telephone Encounter (Signed)
Patient aware and verbalized understanding. °

## 2021-06-06 NOTE — Telephone Encounter (Signed)
Baclofen Prescription sent to pharmacy  ? ?

## 2021-06-07 DIAGNOSIS — J449 Chronic obstructive pulmonary disease, unspecified: Secondary | ICD-10-CM | POA: Diagnosis not present

## 2021-06-07 DIAGNOSIS — E1151 Type 2 diabetes mellitus with diabetic peripheral angiopathy without gangrene: Secondary | ICD-10-CM | POA: Diagnosis not present

## 2021-06-07 DIAGNOSIS — C3490 Malignant neoplasm of unspecified part of unspecified bronchus or lung: Secondary | ICD-10-CM | POA: Diagnosis not present

## 2021-06-07 DIAGNOSIS — I110 Hypertensive heart disease with heart failure: Secondary | ICD-10-CM | POA: Diagnosis not present

## 2021-06-07 DIAGNOSIS — E1142 Type 2 diabetes mellitus with diabetic polyneuropathy: Secondary | ICD-10-CM | POA: Diagnosis not present

## 2021-06-07 DIAGNOSIS — I5032 Chronic diastolic (congestive) heart failure: Secondary | ICD-10-CM | POA: Diagnosis not present

## 2021-06-11 DIAGNOSIS — C3490 Malignant neoplasm of unspecified part of unspecified bronchus or lung: Secondary | ICD-10-CM | POA: Diagnosis not present

## 2021-06-11 DIAGNOSIS — I5032 Chronic diastolic (congestive) heart failure: Secondary | ICD-10-CM | POA: Diagnosis not present

## 2021-06-11 DIAGNOSIS — J449 Chronic obstructive pulmonary disease, unspecified: Secondary | ICD-10-CM | POA: Diagnosis not present

## 2021-06-11 DIAGNOSIS — E1142 Type 2 diabetes mellitus with diabetic polyneuropathy: Secondary | ICD-10-CM | POA: Diagnosis not present

## 2021-06-11 DIAGNOSIS — E1151 Type 2 diabetes mellitus with diabetic peripheral angiopathy without gangrene: Secondary | ICD-10-CM | POA: Diagnosis not present

## 2021-06-11 DIAGNOSIS — I110 Hypertensive heart disease with heart failure: Secondary | ICD-10-CM | POA: Diagnosis not present

## 2021-06-12 ENCOUNTER — Telehealth: Payer: Self-pay | Admitting: Family

## 2021-06-12 ENCOUNTER — Encounter (HOSPITAL_COMMUNITY): Payer: Self-pay | Admitting: Internal Medicine

## 2021-06-12 DIAGNOSIS — E1151 Type 2 diabetes mellitus with diabetic peripheral angiopathy without gangrene: Secondary | ICD-10-CM | POA: Diagnosis not present

## 2021-06-12 DIAGNOSIS — E875 Hyperkalemia: Secondary | ICD-10-CM

## 2021-06-12 DIAGNOSIS — I110 Hypertensive heart disease with heart failure: Secondary | ICD-10-CM | POA: Diagnosis not present

## 2021-06-12 DIAGNOSIS — C3490 Malignant neoplasm of unspecified part of unspecified bronchus or lung: Secondary | ICD-10-CM | POA: Diagnosis not present

## 2021-06-12 DIAGNOSIS — I5032 Chronic diastolic (congestive) heart failure: Secondary | ICD-10-CM | POA: Diagnosis not present

## 2021-06-12 DIAGNOSIS — J449 Chronic obstructive pulmonary disease, unspecified: Secondary | ICD-10-CM | POA: Diagnosis not present

## 2021-06-12 DIAGNOSIS — E1142 Type 2 diabetes mellitus with diabetic polyneuropathy: Secondary | ICD-10-CM | POA: Diagnosis not present

## 2021-06-12 NOTE — Progress Notes (Signed)
Attempted to obtain medical history via telephone, unable to reach at this time. I left a voicemail to return pre surgical testing department's phone call.  

## 2021-06-12 NOTE — Telephone Encounter (Signed)
Does the baclofen help? BMP pending.  ?

## 2021-06-12 NOTE — Telephone Encounter (Signed)
Colletta Maryland called from [facility] to let PCP know that patient is still complaining about having a lot of leg cramps. ? ?Wants to know if PCP can order for him to have his potassium checked. ?

## 2021-06-13 ENCOUNTER — Other Ambulatory Visit: Payer: Self-pay | Admitting: Family

## 2021-06-13 NOTE — Telephone Encounter (Signed)
I spoke to [facility] nurse) and advised BMP ordered so she will draw tomorrow and she says pt has only taken baclofen once and his sugars went up so he is concerned the baclofen caused it. He is taking 2 of the torsemide so she just wanted to make sure his potassium isn't off since he is only cramping in his legs. ?

## 2021-06-14 DIAGNOSIS — E1142 Type 2 diabetes mellitus with diabetic polyneuropathy: Secondary | ICD-10-CM | POA: Diagnosis not present

## 2021-06-14 DIAGNOSIS — J449 Chronic obstructive pulmonary disease, unspecified: Secondary | ICD-10-CM | POA: Diagnosis not present

## 2021-06-14 DIAGNOSIS — C3490 Malignant neoplasm of unspecified part of unspecified bronchus or lung: Secondary | ICD-10-CM | POA: Diagnosis not present

## 2021-06-14 DIAGNOSIS — I5032 Chronic diastolic (congestive) heart failure: Secondary | ICD-10-CM | POA: Diagnosis not present

## 2021-06-14 DIAGNOSIS — E1151 Type 2 diabetes mellitus with diabetic peripheral angiopathy without gangrene: Secondary | ICD-10-CM | POA: Diagnosis not present

## 2021-06-14 DIAGNOSIS — I110 Hypertensive heart disease with heart failure: Secondary | ICD-10-CM | POA: Diagnosis not present

## 2021-06-17 ENCOUNTER — Other Ambulatory Visit: Payer: Self-pay | Admitting: *Deleted

## 2021-06-17 DIAGNOSIS — Z20822 Contact with and (suspected) exposure to covid-19: Secondary | ICD-10-CM | POA: Diagnosis not present

## 2021-06-17 DIAGNOSIS — I739 Peripheral vascular disease, unspecified: Secondary | ICD-10-CM

## 2021-06-19 ENCOUNTER — Inpatient Hospital Stay (HOSPITAL_COMMUNITY): Payer: Medicare Other | Attending: Hematology

## 2021-06-19 ENCOUNTER — Other Ambulatory Visit: Payer: Self-pay

## 2021-06-19 ENCOUNTER — Ambulatory Visit (HOSPITAL_COMMUNITY)
Admission: RE | Admit: 2021-06-19 | Discharge: 2021-06-19 | Disposition: A | Discharge status: Home / Self Care | Payer: Medicare Other | Source: Ambulatory Visit | Attending: Hematology | Admitting: Hematology

## 2021-06-19 ENCOUNTER — Encounter (HOSPITAL_COMMUNITY): Payer: Self-pay | Admitting: Internal Medicine

## 2021-06-19 DIAGNOSIS — E1151 Type 2 diabetes mellitus with diabetic peripheral angiopathy without gangrene: Secondary | ICD-10-CM | POA: Diagnosis not present

## 2021-06-19 DIAGNOSIS — E538 Deficiency of other specified B group vitamins: Secondary | ICD-10-CM

## 2021-06-19 DIAGNOSIS — E039 Hypothyroidism, unspecified: Secondary | ICD-10-CM

## 2021-06-19 DIAGNOSIS — D500 Iron deficiency anemia secondary to blood loss (chronic): Secondary | ICD-10-CM

## 2021-06-19 DIAGNOSIS — J449 Chronic obstructive pulmonary disease, unspecified: Secondary | ICD-10-CM | POA: Diagnosis not present

## 2021-06-19 DIAGNOSIS — R918 Other nonspecific abnormal finding of lung field: Secondary | ICD-10-CM | POA: Diagnosis not present

## 2021-06-19 DIAGNOSIS — I110 Hypertensive heart disease with heart failure: Secondary | ICD-10-CM | POA: Diagnosis not present

## 2021-06-19 DIAGNOSIS — I5032 Chronic diastolic (congestive) heart failure: Secondary | ICD-10-CM | POA: Diagnosis not present

## 2021-06-19 DIAGNOSIS — J439 Emphysema, unspecified: Secondary | ICD-10-CM | POA: Diagnosis not present

## 2021-06-19 DIAGNOSIS — C3491 Malignant neoplasm of unspecified part of right bronchus or lung: Secondary | ICD-10-CM

## 2021-06-19 DIAGNOSIS — C3490 Malignant neoplasm of unspecified part of unspecified bronchus or lung: Secondary | ICD-10-CM | POA: Diagnosis not present

## 2021-06-19 DIAGNOSIS — E1142 Type 2 diabetes mellitus with diabetic polyneuropathy: Secondary | ICD-10-CM | POA: Diagnosis not present

## 2021-06-19 LAB — IRON AND TIBC
Iron: 64 ug/dL (ref 45–182)
Saturation Ratios: 14 % — ABNORMAL LOW (ref 17.9–39.5)
TIBC: 461 ug/dL — ABNORMAL HIGH (ref 250–450)
UIBC: 397 ug/dL

## 2021-06-19 LAB — CBC WITH DIFFERENTIAL/PLATELET
Abs Immature Granulocytes: 0.02 10*3/uL (ref 0.00–0.07)
Basophils Absolute: 0 10*3/uL (ref 0.0–0.1)
Basophils Relative: 1 %
Eosinophils Absolute: 0.1 10*3/uL (ref 0.0–0.5)
Eosinophils Relative: 2 %
HCT: 35.7 % — ABNORMAL LOW (ref 39.0–52.0)
Hemoglobin: 11.5 g/dL — ABNORMAL LOW (ref 13.0–17.0)
Immature Granulocytes: 0 %
Lymphocytes Relative: 11 %
Lymphs Abs: 0.8 10*3/uL (ref 0.7–4.0)
MCH: 35.9 pg — ABNORMAL HIGH (ref 26.0–34.0)
MCHC: 32.2 g/dL (ref 30.0–36.0)
MCV: 111.6 fL — ABNORMAL HIGH (ref 80.0–100.0)
Monocytes Absolute: 0.7 10*3/uL (ref 0.1–1.0)
Monocytes Relative: 9 %
Neutro Abs: 5.7 10*3/uL (ref 1.7–7.7)
Neutrophils Relative %: 77 %
Platelets: 272 10*3/uL (ref 150–400)
RBC: 3.2 MIL/uL — ABNORMAL LOW (ref 4.22–5.81)
RDW: 14.4 % (ref 11.5–15.5)
WBC: 7.3 10*3/uL (ref 4.0–10.5)
nRBC: 0 % (ref 0.0–0.2)

## 2021-06-19 LAB — VITAMIN B12: Vitamin B-12: 2194 pg/mL — ABNORMAL HIGH (ref 180–914)

## 2021-06-19 LAB — FERRITIN: Ferritin: 37 ng/mL (ref 24–336)

## 2021-06-19 LAB — COMPREHENSIVE METABOLIC PANEL
ALT: 14 U/L (ref 0–44)
AST: 25 U/L (ref 15–41)
Albumin: 3.9 g/dL (ref 3.5–5.0)
Alkaline Phosphatase: 85 U/L (ref 38–126)
Anion gap: 12 (ref 5–15)
BUN: 14 mg/dL (ref 8–23)
CO2: 24 mmol/L (ref 22–32)
Calcium: 8.9 mg/dL (ref 8.9–10.3)
Chloride: 100 mmol/L (ref 98–111)
Creatinine, Ser: 1.09 mg/dL (ref 0.61–1.24)
GFR, Estimated: >60 mL/min (ref >60)
Glucose, Bld: 145 mg/dL — ABNORMAL HIGH (ref 70–99)
Potassium: 4.1 mmol/L (ref 3.5–5.1)
Sodium: 136 mmol/L (ref 135–145)
Total Bilirubin: 0.5 mg/dL (ref 0.3–1.2)
Total Protein: 7.3 g/dL (ref 6.5–8.1)

## 2021-06-19 MED ORDER — IOHEXOL 300 MG/ML  SOLN
100.0000 mL | Freq: Once | INTRAMUSCULAR | Status: AC | PRN
  Administered 2021-06-19: 75 mL via INTRAVENOUS

## 2021-06-19 NOTE — Anesthesia Preprocedure Evaluation (Addendum)
Anesthesia Evaluation  ?Patient identified by MRN, date of birth, ID band ?Patient awake ? ? ? ?Reviewed: ?Allergy & Precautions, NPO status , Patient's Chart, lab work & pertinent test results, reviewed documented beta blocker date and time  ? ?History of Anesthesia Complications ?Negative for: history of anesthetic complications ? ?Airway ?Mallampati: II ? ?TM Distance: >3 FB ?Neck ROM: Full ? ? ? Dental ? ?(+) Edentulous Upper, Edentulous Lower ?  ?Pulmonary ?sleep apnea , COPD,  COPD inhaler, Current Smoker and Patient abstained from smoking.,  ?  ?Pulmonary exam normal ? ? ? ? ? ? ? Cardiovascular ?hypertension, Pt. on medications and Pt. on home beta blockers ?+ Peripheral Vascular Disease and +CHF  ?Normal cardiovascular exam ? ?TTE 08/2020: EF 73-41%, RV systolic function mildly reduced, aortic valve area by VTI measures 0.91 cm?  ?  ?Neuro/Psych ?Anxiety Depression   ? GI/Hepatic ?Neg liver ROS, PUD, GERD  Medicated and Controlled,  ?Endo/Other  ?diabetes, Type 2, Oral Hypoglycemic AgentsHypothyroidism  ? Renal/GU ?negative Renal ROS  ?negative genitourinary ?  ?Musculoskeletal ? ?(+) Arthritis ,  ? Abdominal ?  ?Peds ? Hematology ? ?(+) Blood dyscrasia, anemia , Hgb 11.5   ?Anesthesia Other Findings ?Day of surgery medications reviewed with patient. ? Reproductive/Obstetrics ? ?  ? ? ? ? ? ? ? ? ? ? ? ? ? ?  ?  ? ? ? ? ? ? ? ?Anesthesia Physical ?Anesthesia Plan ? ?ASA: 4 ? ?Anesthesia Plan: MAC  ? ?Post-op Pain Management: Minimal or no pain anticipated  ? ?Induction:  ? ?PONV Risk Score and Plan: Treatment may vary due to age or medical condition and Propofol infusion ? ?Airway Management Planned: Natural Airway and Nasal Cannula ? ?Additional Equipment: None ? ?Intra-op Plan:  ? ?Post-operative Plan:  ? ?Informed Consent: I have reviewed the patients History and Physical, chart, labs and discussed the procedure including the risks, benefits and alternatives for the  proposed anesthesia with the patient or authorized representative who has indicated his/her understanding and acceptance.  ? ? ? ? ? ?Plan Discussed with: CRNA ? ?Anesthesia Plan Comments:   ? ? ? ? ? ?Anesthesia Quick Evaluation ? ?

## 2021-06-20 ENCOUNTER — Other Ambulatory Visit: Payer: Self-pay

## 2021-06-20 ENCOUNTER — Ambulatory Visit (HOSPITAL_COMMUNITY): Payer: Medicare Other | Admitting: Anesthesiology

## 2021-06-20 ENCOUNTER — Ambulatory Visit (HOSPITAL_COMMUNITY)
Admission: RE | Admit: 2021-06-20 | Discharge: 2021-06-20 | Disposition: A | Discharge status: Home / Self Care | Payer: Medicare Other | Attending: Internal Medicine | Admitting: Internal Medicine

## 2021-06-20 ENCOUNTER — Ambulatory Visit (HOSPITAL_BASED_OUTPATIENT_CLINIC_OR_DEPARTMENT_OTHER): Payer: Medicare Other | Admitting: Anesthesiology

## 2021-06-20 ENCOUNTER — Encounter (HOSPITAL_COMMUNITY): Payer: Self-pay | Admitting: Internal Medicine

## 2021-06-20 ENCOUNTER — Encounter (HOSPITAL_COMMUNITY)
Admission: RE | Disposition: A | Discharge status: Home / Self Care | Payer: Self-pay | Source: Home / Self Care | Attending: Internal Medicine

## 2021-06-20 DIAGNOSIS — E1151 Type 2 diabetes mellitus with diabetic peripheral angiopathy without gangrene: Secondary | ICD-10-CM | POA: Insufficient documentation

## 2021-06-20 DIAGNOSIS — I5032 Chronic diastolic (congestive) heart failure: Secondary | ICD-10-CM | POA: Diagnosis not present

## 2021-06-20 DIAGNOSIS — Z7901 Long term (current) use of anticoagulants: Secondary | ICD-10-CM | POA: Diagnosis not present

## 2021-06-20 DIAGNOSIS — J449 Chronic obstructive pulmonary disease, unspecified: Secondary | ICD-10-CM | POA: Insufficient documentation

## 2021-06-20 DIAGNOSIS — J9611 Chronic respiratory failure with hypoxia: Secondary | ICD-10-CM | POA: Insufficient documentation

## 2021-06-20 DIAGNOSIS — Z8711 Personal history of peptic ulcer disease: Secondary | ICD-10-CM | POA: Diagnosis not present

## 2021-06-20 DIAGNOSIS — G4733 Obstructive sleep apnea (adult) (pediatric): Secondary | ICD-10-CM | POA: Diagnosis not present

## 2021-06-20 DIAGNOSIS — Z7984 Long term (current) use of oral hypoglycemic drugs: Secondary | ICD-10-CM | POA: Insufficient documentation

## 2021-06-20 DIAGNOSIS — Z1211 Encounter for screening for malignant neoplasm of colon: Secondary | ICD-10-CM

## 2021-06-20 DIAGNOSIS — I480 Paroxysmal atrial fibrillation: Secondary | ICD-10-CM | POA: Diagnosis not present

## 2021-06-20 DIAGNOSIS — F17210 Nicotine dependence, cigarettes, uncomplicated: Secondary | ICD-10-CM | POA: Insufficient documentation

## 2021-06-20 DIAGNOSIS — K5730 Diverticulosis of large intestine without perforation or abscess without bleeding: Secondary | ICD-10-CM

## 2021-06-20 DIAGNOSIS — K5900 Constipation, unspecified: Secondary | ICD-10-CM | POA: Diagnosis not present

## 2021-06-20 DIAGNOSIS — E039 Hypothyroidism, unspecified: Secondary | ICD-10-CM

## 2021-06-20 DIAGNOSIS — Z9981 Dependence on supplemental oxygen: Secondary | ICD-10-CM | POA: Diagnosis not present

## 2021-06-20 DIAGNOSIS — Z7902 Long term (current) use of antithrombotics/antiplatelets: Secondary | ICD-10-CM | POA: Diagnosis not present

## 2021-06-20 DIAGNOSIS — R195 Other fecal abnormalities: Secondary | ICD-10-CM

## 2021-06-20 DIAGNOSIS — M1990 Unspecified osteoarthritis, unspecified site: Secondary | ICD-10-CM | POA: Diagnosis not present

## 2021-06-20 DIAGNOSIS — K5520 Angiodysplasia of colon without hemorrhage: Secondary | ICD-10-CM

## 2021-06-20 DIAGNOSIS — I110 Hypertensive heart disease with heart failure: Secondary | ICD-10-CM | POA: Diagnosis not present

## 2021-06-20 DIAGNOSIS — C3490 Malignant neoplasm of unspecified part of unspecified bronchus or lung: Secondary | ICD-10-CM | POA: Insufficient documentation

## 2021-06-20 DIAGNOSIS — K219 Gastro-esophageal reflux disease without esophagitis: Secondary | ICD-10-CM | POA: Insufficient documentation

## 2021-06-20 DIAGNOSIS — I509 Heart failure, unspecified: Secondary | ICD-10-CM | POA: Diagnosis not present

## 2021-06-20 DIAGNOSIS — Z79899 Other long term (current) drug therapy: Secondary | ICD-10-CM | POA: Diagnosis not present

## 2021-06-20 DIAGNOSIS — D509 Iron deficiency anemia, unspecified: Secondary | ICD-10-CM | POA: Diagnosis not present

## 2021-06-20 DIAGNOSIS — E538 Deficiency of other specified B group vitamins: Secondary | ICD-10-CM | POA: Diagnosis not present

## 2021-06-20 DIAGNOSIS — Z95820 Peripheral vascular angioplasty status with implants and grafts: Secondary | ICD-10-CM | POA: Insufficient documentation

## 2021-06-20 HISTORY — PX: COLONOSCOPY WITH PROPOFOL: SHX5780

## 2021-06-20 LAB — GLUCOSE, CAPILLARY: Glucose-Capillary: 139 mg/dL — ABNORMAL HIGH (ref 70–99)

## 2021-06-20 SURGERY — COLONOSCOPY WITH PROPOFOL
Anesthesia: Monitor Anesthesia Care

## 2021-06-20 MED ORDER — PROPOFOL 10 MG/ML IV BOLUS
INTRAVENOUS | Status: DC | PRN
  Administered 2021-06-20 (×3): 20 mg via INTRAVENOUS

## 2021-06-20 MED ORDER — SODIUM CHLORIDE 0.9 % IV SOLN: INTRAVENOUS | Status: DC

## 2021-06-20 MED ORDER — PROPOFOL 10 MG/ML IV BOLUS
INTRAVENOUS | Status: AC
  Filled 2021-06-20: qty 20

## 2021-06-20 MED ORDER — PROPOFOL 500 MG/50ML IV EMUL
INTRAVENOUS | Status: DC | PRN
  Administered 2021-06-20: 125 ug/kg/min via INTRAVENOUS

## 2021-06-20 MED ORDER — LACTATED RINGERS IV SOLN: INTRAVENOUS | Status: DC | PRN

## 2021-06-20 MED ORDER — PHENYLEPHRINE 40 MCG/ML (10ML) SYRINGE FOR IV PUSH (FOR BLOOD PRESSURE SUPPORT)
PREFILLED_SYRINGE | INTRAVENOUS | Status: DC | PRN
  Administered 2021-06-20 (×2): 80 ug via INTRAVENOUS

## 2021-06-20 MED ORDER — PROPOFOL 500 MG/50ML IV EMUL
INTRAVENOUS | Status: AC
  Filled 2021-06-20: qty 50

## 2021-06-20 MED ORDER — LIDOCAINE 2% (20 MG/ML) 5 ML SYRINGE
INTRAMUSCULAR | Status: DC | PRN
  Administered 2021-06-20: 100 mg via INTRAVENOUS

## 2021-06-20 SURGICAL SUPPLY — 22 items

## 2021-06-20 NOTE — Anesthesia Procedure Notes (Signed)
Date/Time: 06/20/2021 8:29 AM ?Performed by: Sharlette Dense, CRNA ?Oxygen Delivery Method: Simple face mask ? ? ? ? ?

## 2021-06-20 NOTE — Op Note (Signed)
[facility] ?Patient Name: John [name] ?Procedure Date: 06/20/2021 ?MRN: [id number] ?Attending MD: Docia Chuck. Henrene Pastor , MD ?Date of Birth: Jun 09, 1945 ?CSN: [id number] ?Age: 76 ?Admit Type: Outpatient ?Procedure:                Colonoscopy ?Indications:              Positive Cologuard test. Prior history of iron  ?                          deficiency anemia. Hemoglobin yesterday 11.5. On  ?                          Plavix since office evaluation due to peripheral  ?                          intervention. ?Providers:                Docia Chuck. Henrene Pastor, MD, Dulcy Fanny, Alphonzo Grieve  ?                          Leighton Roach, Technician ?Referring MD:             Serita Sheller FNP ?Medicines:                Monitored Anesthesia Care ?Complications:            No immediate complications. Estimated blood loss:  ?                          None. ?Estimated Blood Loss:     Estimated blood loss: none. ?Procedure:                Pre-Anesthesia Assessment: ?                          - Prior to the procedure, a History and Physical  ?                          was performed, and patient medications and  ?                          allergies were reviewed. The patient's tolerance of  ?                          previous anesthesia was also reviewed. The risks  ?                          and benefits of the procedure and the sedation  ?                          options and risks were discussed with the patient.  ?                          All questions were answered, and informed consent  ?                          was obtained. Prior Anticoagulants: The patient  has  ?                          taken Eliquis (apixaban), last dose was 3 days  ?                          prior to procedure. Plavix, last dose yesterday.  ?                          ASA Grade Assessment: III - A patient with severe  ?                          systemic disease. After reviewing the risks and  ?                          benefits, the patient was deemed in  satisfactory  ?                          condition to undergo the procedure. ?                          After obtaining informed consent, the colonoscope  ?                          was passed under direct vision. Throughout the  ?                          procedure, the patient's blood pressure, pulse, and  ?                          oxygen saturations were monitored continuously. The  ?                          CF-HQ190L ([id number]) Olympus colonoscope was  ?                          introduced through the anus and advanced to the the  ?                          cecum, identified by appendiceal orifice and  ?                          ileocecal valve. The ileocecal valve, appendiceal  ?                          orifice, and rectum were photographed. The quality  ?                          of the bowel preparation was excellent. The  ?                          colonoscopy was performed without difficulty. The  ?  patient tolerated the procedure well. The bowel  ?                          preparation used was SUPREP via split dose  ?                          instruction. ?Scope In: 8:39:09 AM ?Scope Out: 8:51:50 AM ?Scope Withdrawal Time: 0 hours 9 minutes 21 seconds  ?Total Procedure Duration: 0 hours 12 minutes 41 seconds  ?Findings: ?     A single large angiodysplastic lesion without bleeding was found in the  ?     cecum. ?     Scattered diverticula were found in the entire colon. ?     The exam was otherwise without abnormality on direct and retroflexion  ?     views. ?Impression:               - A single non-bleeding colonic angiodysplastic  ?                          lesion. This can cause Hemoccult positive  ?                          stool/positive Cologuard. ?                          - Diverticulosis in the entire examined colon. ?                          - The examination was otherwise normal on direct  ?                          and retroflexion views. ?                          -  No specimens collected. ?Moderate Sedation: ?     none ?Recommendation:           - Repeat colonoscopy is not recommended for  ?                          surveillance. ?                          - Patient has a contact number available for  ?                          emergencies. The signs and symptoms of potential  ?                          delayed complications were discussed with the  ?                          patient. Return to normal activities tomorrow.  ?                          Written discharge instructions were provided to the  ?  patient. ?                          - Resume previous diet. ?                          - Continue present medications. ?                          -Resume both Eliquis and Plavix today ?                          -Recommend that you take an iron supplement every  ?                          day indefinitely to prevent anemia. You may pick up  ?                          an iron supplement over-the-counter. 65 mg of  ?                          elemental iron. Ask your pharmacist ?Procedure Code(s):        --- Professional --- ?                          [phone], Colonoscopy, flexible; diagnostic, including  ?                          collection of specimen(s) by brushing or washing,  ?                          when performed (separate procedure) ?Diagnosis Code(s):        --- Professional --- ?                          K55.20, Angiodysplasia of colon without hemorrhage ?                          R19.5, Other fecal abnormalities ?                          K57.30, Diverticulosis of large intestine without  ?                          perforation or abscess without bleeding ?CPT copyright 2019 [employer] Association. All rights reserved. ?The codes documented in this report are preliminary and upon coder review may  ?be revised to meet current compliance requirements. ?Docia Chuck. Henrene Pastor, MD ?06/20/2021 9:11:48 AM ?This report has been signed  electronically. ?Number of Addenda: 0 ?

## 2021-06-20 NOTE — Anesthesia Postprocedure Evaluation (Signed)
Anesthesia Post Note ? ?Patient: John [name] ? ?Procedure(s) Performed: COLONOSCOPY WITH PROPOFOL ? ?  ? ?Patient location during evaluation: PACU ?Anesthesia Type: MAC ?Level of consciousness: awake and alert ?Pain management: pain level controlled ?Vital Signs Assessment: post-procedure vital signs reviewed and stable ?Respiratory status: spontaneous breathing, nonlabored ventilation and respiratory function stable ?Cardiovascular status: blood pressure returned to baseline ?Postop Assessment: no apparent nausea or vomiting ?Anesthetic complications: no ? ? ?No notable events documented. ? ?Last Vitals:  ?Vitals:  ? 06/20/21 0858 06/20/21 0908  ?BP: (!) 87/56 131/71  ?Pulse: 95 94  ?Resp: 15 14  ?Temp: 37.1 ?C   ?SpO2: 98% 100%  ?  ?Last Pain:  ?Vitals:  ? 06/20/21 0908  ?TempSrc:   ?PainSc: 0-No pain  ? ? ?  ?  ?  ?  ?  ?  ? ?Marthenia Rolling ? ? ? ? ?

## 2021-06-20 NOTE — Transfer of Care (Signed)
Immediate Anesthesia Transfer of Care Note ? ?Patient: John [name] ? ?Procedure(s) Performed: COLONOSCOPY WITH PROPOFOL ? ?Patient Location: Endoscopy Unit ? ?Anesthesia Type:MAC ? ?Level of Consciousness: drowsy ? ?Airway & Oxygen Therapy: Patient Spontanous Breathing and Patient connected to face mask oxygen ? ?Post-op Assessment: Report given to RN and Post -op Vital signs reviewed and stable ? ?Post vital signs: Reviewed and stable ? ?Last Vitals:  ?Vitals Value Taken Time  ?BP 87/56 06/20/21 0858  ?Temp 37.1 ?C 06/20/21 0858  ?Pulse 95 06/20/21 0858  ?Resp 14 06/20/21 0859  ?SpO2 98 % 06/20/21 0858  ?Vitals shown include unvalidated device data. ? ?Last Pain:  ?Vitals:  ? 06/20/21 0858  ?TempSrc: Tympanic  ?PainSc: Asleep  ?   ? ?  ? ?Complications: No notable events documented. ?

## 2021-06-20 NOTE — H&P (Signed)
HISTORY OF PRESENT ILLNESS: ?  ?John [name] is a 76 y.o. male with multiple significant medical problems including non-small cell lung cancer, chronic diastolic CHF (ejection fraction on last echo 70 to 65%), COPD, obstructive sleep apnea, chronic hypoxic respiratory failure with as needed oxygen use, diabetes mellitus, hypothyroidism, peripheral vascular disease with femoral endarterectomy and common iliac stenting, paroxysmal atrial fibrillation on Eliquis, gastric ulcer.  He is sent today by his primary care provider regarding post hospital follow-up and positive Cologuard testing.  I saw the patient in the hospital as an unassigned patient June 2022 regarding iron deficiency anemia and Hemoccult positive stool.  He underwent upper endoscopy and was found to have 2 tiny gastric ulcers without bleeding.  He was placed on PPI which she remains on.  He was advised with regards to iron replacement therapy which he remains on.  Outpatient GI follow-up with probable colonoscopy thereafter.  Patient since been diagnosed with B12 deficiency.  His PCP performed Cologuard testing December 2022 which returned positive.  The patient denies melena or hematochezia.  Review of blood counts from September 2022 shows a hemoglobin of 11.4.  This is improved from hemoglobin 7.0 July 2022.  Patient is accompanied today by his wife, John [name].  His GI review of systems is remarkable for occasional constipation for which she takes Linzess.  He does have occasional bloating.  They tell me that he underwent colonoscopy in [facility] about 7 years ago with no problems found.  Follow-up colonoscopy in 10 years recommended.  CT scan October 09, 2020 revealed mildly nodular hepatic contour.  No acute abnormalities.  Right groin seroma noted. ?REVIEW OF SYSTEMS: ?  ?All non-GI ROS negative unless otherwise stated in the HPI except for arthritis, visual change, cough, fatigue, itching, excessive thirst, hearing problems, sleeping problems,  shortness of breath ?  ?    ?Past Medical History:  ?Diagnosis Date  ? Adenocarcinoma of lung, right (John [name]) 04/18/2016  ? Anxiety    ? Arthritis    ? Asthma    ? BPH (benign prostatic hyperplasia)    ?  with urinary retention 02/06/20  ? CHF (congestive heart failure) (John [name])    ? COPD (chronic obstructive pulmonary disease) (John [name])    ? Depression    ? Diabetes mellitus without complication (John [name])    ?  no meds  ? Diabetes mellitus, type II (John [name])    ? DM (diabetes mellitus) (John [name]) 07/09/2016  ? Dyspnea    ? History of kidney stones    ? History of radiation therapy 06/25/16-08/20/16  ?  right lung  ? History of radiation therapy 09/17/2020  ?  right lung 09/10/2020-09/17/2020   Dr Sondra Come  ? Hyperlipidemia    ? Hypertension    ? Hypertension    ? Hypothyroidism    ? Macular degeneration    ? Neuropathy    ? Non-small cell lung cancer, right (John [name]) 04/18/2016  ? Peripheral vascular disease (John [name])    ? Prostatitis    ? Pulmonary nodule, left 07/16/2016  ? Sleep apnea    ?  cpap  ?  ?  ?     ?Past Surgical History:  ?Procedure Laterality Date  ? ABDOMINAL AORTOGRAM W/LOWER EXTREMITY Left 02/06/2020  ?  Procedure: ABDOMINAL AORTOGRAM W/LOWER EXTREMITY;  Surgeon: Lorretta Harp, MD;  Location: [facility];  Service: Cardiovascular;  Laterality: Left;  ? CARDIOVERSION N/A 10/05/2020  ?  Procedure: CARDIOVERSION;  Surgeon: Geralynn Rile, MD;  Location: [place];  Service: Cardiovascular;  Laterality: N/A;  ? CATARACT EXTRACTION, BILATERAL Bilateral    ? ENDARTERECTOMY FEMORAL Right 08/20/2020  ?  Procedure: ENDARTERECTOMY  RIGHT FEMORAL ARTERY;  Surgeon: Marty Heck, MD;  Location: [place];  Service: Vascular;  Laterality: Right;  ? ESOPHAGOGASTRODUODENOSCOPY (EGD) WITH PROPOFOL N/A 09/06/2020  ?  Procedure: ESOPHAGOGASTRODUODENOSCOPY (EGD) WITH PROPOFOL;  Surgeon: Irene Shipper, MD;  Location: [facility] END[facility]COPY;  Service: Endoscopy;  Laterality: N/A;  ? INSERTION OF ILIAC STENT Right 08/20/2020  ?  Procedure: RETROGRADE  INSERTION OF RIGHT ILIAC STENT;  Surgeon: Marty Heck, MD;  Location: [place];  Service: Vascular;  Laterality: Right;  ? INTRAOPERATIVE ARTERIOGRAM Right 08/20/2020  ?  Procedure: INTRA OPERATIVE ARTERIOGRAM ILIAC;  Surgeon: Marty Heck, MD;  Location: [place];  Service: Vascular;  Laterality: Right;  ? PATCH ANGIOPLASTY Right 08/20/2020  ?  Procedure: PATCH ANGIOPLASTY RIGHT FEMORAL ARTERY;  Surgeon: Marty Heck, MD;  Location: [facility];  Service: Vascular;  Laterality: Right;  ? PORTACATH PLACEMENT Left 06/13/2016  ?  Procedure: INSERTION PORT-A-CATH;  Surgeon: Aviva Signs, MD;  Location: [facility];  Service: General;  Laterality: Left;  ? TRANSURETHRAL RESECTION OF PROSTATE N/A 05/31/2020  ?  Procedure: TRANSURETHRAL RESECTION OF THE PROSTATE (TURP);  Surgeon: Cleon Gustin, MD;  Location: [facility];  Service: Urology;  Laterality: N/A;  ? VIDEO BRONCHOSCOPY WITH ENDOBRONCHIAL NAVIGATION N/A 05/28/2016  ?  Procedure: VIDEO BRONCHOSCOPY WITH ENDOBRONCHIAL NAVIGATION;  Surgeon: [name], MD;  Location: [place];  Service: Thoracic;  Laterality: N/A;  ? VIDEO BRONCHOSCOPY WITH ENDOBRONCHIAL ULTRASOUND N/A 05/28/2016  ?  Procedure: VIDEO BRONCHOSCOPY WITH ENDOBRONCHIAL ULTRASOUND;  Surgeon: [name], MD;  Location: [place];  Service: Thoracic;  Laterality: N/A;  ?  ?  ?Social History ?John [name]  reports that he has been smoking cigarettes. He started smoking about 60 years ago. He has a 27.50 pack-year smoking history. He has never used smokeless tobacco. He reports that he does not drink alcohol and does not use drugs. ?  ?family history includes Diabetes in his father; Heart disease in his father; Hypertension in his mother and sister; Stroke in his father. ?  ?     ?Allergies  ?Allergen Reactions  ? Sulfa Antibiotics Swelling  ?    Mouth swelling  ? Atorvastatin Other (See Comments)  ?    Muscle aches - tolerating Pravastatin 40 mg MWF  ? Jardiance [Empagliflozin] Other  (See Comments)  ?    FEELS SLUGGISH, TIRED  ? Lopressor [Metoprolol] Other (See Comments)  ?    Fatigue  ? Rosuvastatin Other (See Comments)  ?    Muscle aches - tolerating Pravastatin 40 mg MWF  ? Temazepam Other (See Comments)  ?    Made insomnia worse   ?  ?  ?  ?  ?PHYSICAL EXAMINATION: ?Vital signs: BP 122/80   Pulse 87   Ht 5\' 6"  (1.676 m)   Wt 164 lb 8 oz (74.6 kg)   BMI 26.55 kg/m?   ?Constitutional: Pleasant, generally well-appearing, no acute distress ?Psychiatric: alert and oriented x3, cooperative ?Eyes: extraocular movements intact, anicteric, conjunctiva pink ?Mouth: oral pharynx moist, no lesions ?Neck: supple no lymphadenopathy ?Cardiovascular: heart regular rate and rhythm. ?Lungs: clear to auscultation bilaterally ?Abdomen: soft, nontender, nondistended, no obvious ascites, no peritoneal signs, normal bowel sounds, no organomegaly ?Rectal: Deferred until colonoscopy ?Extremities: no clubbing, cyanosis, or lower extremity edema bilaterally ?Skin: no lesions on visible extremities ?Neuro: No  focal deficits. No asterixis.  ?  ?  ?  ?ASSESSMENT: ?  ?1.  Positive Cologuard testing ?2.  History of iron deficiency anemia.  Improved on iron replacement ?3.  B12 deficiency.  Now on replacement ?4.  Multiple significant medical problems ?5.  Chronic anticoagulation/PAF ?6.  Diabetes mellitus ?7.  Chronic lung disease.  As needed oxygen use ?A.  History of ulcer disease ?  ?PLAN: ?  ?1.  Colonoscopy to evaluate history of iron deficiency anemia and positive Cologuard testing.  The patient is HIGH RISK given his comorbidities, lung disease, and the need to address chronic anticoagulation therapy.  We discussed the pros and cons of short-term discontinuation of anticoagulation therapy for his procedure.  We will have him hold his Eliquis 2 days prior to the procedure.  Also, hold diabetic medications to avoid unwanted hypoglycemia.  Due to his comorbid conditions, his examination will be set up at the  hospital with monitored anesthesia care.The nature of the procedure, as well as the risks, benefits, and alternatives were carefully and thoroughly reviewed with the patient. Ample time for discussion and questions

## 2021-06-20 NOTE — Discharge Instructions (Signed)
YOU HAD AN ENDOSCOPIC PROCEDURE TODAY: Refer to the procedure report and other information in the discharge instructions given to you for any specific questions about what was found during the examination. If this information does not answer your questions, please call [place] office at [phone] to clarify.  ° °YOU SHOULD EXPECT: Some feelings of bloating in the abdomen. Passage of more gas than usual. Walking can help get rid of the air that was put into your GI tract during the procedure and reduce the bloating. If you had a lower endoscopy (such as a colonoscopy or flexible sigmoidoscopy) you may notice spotting of blood in your stool or on the toilet paper. Some abdominal soreness may be present for a day or two, also. ° °DIET: Your first meal following the procedure should be a light meal and then it is ok to progress to your normal diet. A half-sandwich or bowl of soup is an example of a good first meal. Heavy or fried foods are harder to digest and may make you feel nauseous or bloated. Drink plenty of fluids but you should avoid alcoholic beverages for 24 hours. If you had a esophageal dilation, please see attached instructions for diet.   ° °ACTIVITY: Your care partner should take you home directly after the procedure. You should plan to take it easy, moving slowly for the rest of the day. You can resume normal activity the day after the procedure however YOU SHOULD NOT DRIVE, use power tools, machinery or perform tasks that involve climbing or major physical exertion for 24 hours (because of the sedation medicines used during the test).  ° °SYMPTOMS TO REPORT IMMEDIATELY: °A gastroenterologist can be reached at any hour. Please call [phone]  for any of the following symptoms:  °Following lower endoscopy (colonoscopy, flexible sigmoidoscopy) °Excessive amounts of blood in the stool  °Significant tenderness, worsening of abdominal pains  °Swelling of the abdomen that is new, acute  °Fever of 100° or  higher  °Following upper endoscopy (EGD, EUS, ERCP, esophageal dilation) °Vomiting of blood or coffee ground material  °New, significant abdominal pain  °New, significant chest pain or pain under the shoulder blades  °Painful or persistently difficult swallowing  °New shortness of breath  °Black, tarry-looking or red, bloody stools ° °FOLLOW UP:  °If any biopsies were taken you will be contacted by phone or by letter within the next 1-3 weeks. Call [phone]  if you have not heard about the biopsies in 3 weeks.  °Please also call with any specific questions about appointments or follow up tests. ° °

## 2021-06-21 ENCOUNTER — Encounter: Payer: Self-pay | Admitting: *Deleted

## 2021-06-21 ENCOUNTER — Encounter (HOSPITAL_COMMUNITY): Payer: Self-pay | Admitting: Internal Medicine

## 2021-06-21 DIAGNOSIS — E1151 Type 2 diabetes mellitus with diabetic peripheral angiopathy without gangrene: Secondary | ICD-10-CM | POA: Diagnosis not present

## 2021-06-21 DIAGNOSIS — J449 Chronic obstructive pulmonary disease, unspecified: Secondary | ICD-10-CM | POA: Diagnosis not present

## 2021-06-21 DIAGNOSIS — E1142 Type 2 diabetes mellitus with diabetic polyneuropathy: Secondary | ICD-10-CM | POA: Diagnosis not present

## 2021-06-21 DIAGNOSIS — C3490 Malignant neoplasm of unspecified part of unspecified bronchus or lung: Secondary | ICD-10-CM | POA: Diagnosis not present

## 2021-06-21 DIAGNOSIS — I110 Hypertensive heart disease with heart failure: Secondary | ICD-10-CM | POA: Diagnosis not present

## 2021-06-21 DIAGNOSIS — I5032 Chronic diastolic (congestive) heart failure: Secondary | ICD-10-CM | POA: Diagnosis not present

## 2021-06-22 ENCOUNTER — Other Ambulatory Visit: Payer: Self-pay | Admitting: Vascular Surgery

## 2021-06-23 LAB — METHYLMALONIC ACID, SERUM: Methylmalonic Acid, Quantitative: 158 nmol/L (ref 0–378)

## 2021-06-24 ENCOUNTER — Other Ambulatory Visit: Payer: Self-pay | Admitting: Cardiovascular Disease

## 2021-06-24 ENCOUNTER — Other Ambulatory Visit: Payer: Self-pay

## 2021-06-24 ENCOUNTER — Inpatient Hospital Stay (HOSPITAL_COMMUNITY): Payer: Medicare Other | Attending: Hematology | Admitting: Hematology

## 2021-06-24 ENCOUNTER — Encounter (HOSPITAL_COMMUNITY): Payer: Self-pay | Admitting: Hematology

## 2021-06-24 VITALS — BP 144/63 | HR 89 | Temp 97.6°F | Resp 18 | Ht 64.17 in | Wt 163.1 lb

## 2021-06-24 DIAGNOSIS — E039 Hypothyroidism, unspecified: Secondary | ICD-10-CM | POA: Insufficient documentation

## 2021-06-24 DIAGNOSIS — C3491 Malignant neoplasm of unspecified part of right bronchus or lung: Secondary | ICD-10-CM | POA: Diagnosis not present

## 2021-06-24 DIAGNOSIS — D500 Iron deficiency anemia secondary to blood loss (chronic): Secondary | ICD-10-CM | POA: Diagnosis not present

## 2021-06-24 DIAGNOSIS — Z7982 Long term (current) use of aspirin: Secondary | ICD-10-CM | POA: Diagnosis not present

## 2021-06-24 DIAGNOSIS — Z79899 Other long term (current) drug therapy: Secondary | ICD-10-CM | POA: Insufficient documentation

## 2021-06-24 DIAGNOSIS — D539 Nutritional anemia, unspecified: Secondary | ICD-10-CM | POA: Insufficient documentation

## 2021-06-24 DIAGNOSIS — E538 Deficiency of other specified B group vitamins: Secondary | ICD-10-CM | POA: Diagnosis not present

## 2021-06-24 DIAGNOSIS — F17210 Nicotine dependence, cigarettes, uncomplicated: Secondary | ICD-10-CM | POA: Insufficient documentation

## 2021-06-24 DIAGNOSIS — Z85118 Personal history of other malignant neoplasm of bronchus and lung: Secondary | ICD-10-CM | POA: Diagnosis not present

## 2021-06-24 DIAGNOSIS — Z7901 Long term (current) use of anticoagulants: Secondary | ICD-10-CM | POA: Diagnosis not present

## 2021-06-24 NOTE — Patient Instructions (Signed)
[place] at [facility] ?Discharge Instructions ? ? ?You were seen and examined today by Dr. Delton Coombes. ? ?He reviewed the results of your lab work and CT scan.  All results are normal/stable.  ? ?We will see you back in 6 months and repeat your lab work and CT scan prior to your next visit.  ? ?Thank you for choosing [place] at [facility] to provide your oncology and hematology care.  To afford each patient quality time with our provider, please arrive at least 15 minutes before your scheduled appointment time.  ? ?If you have a lab appointment with the [facility] please come in thru the Main Entrance and check in at the main information desk. ? ?You need to re-schedule your appointment should you arrive 10 or more minutes late.  We strive to give you quality time with our providers, and arriving late affects you and other patients whose appointments are after yours.  Also, if you no show three or more times for appointments you may be dismissed from the clinic at the providers discretion.     ?Again, thank you for choosing [facility].  Our hope is that these requests will decrease the amount of time that you wait before being seen by our physicians.       ?_____________________________________________________________ ? ?Should you have questions after your visit to [facility], please contact our office at [phone] and follow the prompts.  Our office hours are 8:00 a.m. and 4:30 p.m. Monday - Friday.  Please note that voicemails left after 4:00 p.m. may not be returned until the following business day.  We are closed weekends and major holidays.  You do have access to a nurse 24-7, just call the main number to the clinic [phone] and do not press any options, hold on the line and a nurse will answer the phone.   ? ?For prescription refill requests, have your pharmacy contact our office and allow 72 hours.   ? ?Due to  Covid, you will need to wear a mask upon entering the hospital. If you do not have a mask, a mask will be given to you at the Main Entrance upon arrival. For doctor visits, patients may have 1 support person age 26 or older with them. For treatment visits, patients can not have anyone with them due to social distancing guidelines and our immunocompromised population.  ? ?   ?

## 2021-06-24 NOTE — Progress Notes (Signed)
? ?[facility] ?[address] ?[place], [place] [address] ? ? ?CLINIC:  ?Medical Oncology/Hematology ? ?PCP:  ?[name], FNP ?[address] / [place] [address] ?[phone] ? ? ?REASON FOR VISIT:  ?Follow-up for right NSCLC ? ?PRIOR THERAPY:  ?1. Chemoradiation with weekly carboplatin and paclitaxel from 06/25/2016 to 08/20/2016. ?2. Consolidation durvalumab from 09/26/2016 to 09/25/2017. ? ?NGS Results: not done ? ?CURRENT THERAPY: surveillance ? ?BRIEF ONCOLOGIC HISTORY:  ?Oncology History  ?Non-small cell lung cancer, right ([place])  ?04/17/2016 Imaging  ? CT chest- 1. Examination is positive for bilateral upper lobe pulmonary ?lesions suspicious for malignancy. Further investigation with PET-CT ?and tissue sampling is advised. ?2. Borderline enlarged paratracheal lymph nodes and right hilar ?nodes. Cannot rule out metastatic adenopathy. Attention to these ?areas on PET-CT is advised. ?3. Emphysema ?4. Aortic atherosclerosis and multi vessel coronary artery ?calcification. ?  ?04/18/2016 Initial Diagnosis  ? Adenocarcinoma of lung, right [facility]) ?  ?04/24/2016 PET scan  ? 1. Adjacent hypermetabolic nodules in the RIGHT upper lobe ?consistent bronchogenic carcinoma. ?2. Suspicion of ipsilateral hilar and paratracheal nodal metastasis. ?3. Nodule in the LEFT upper lobe with suspicious morphology has a ?relatively low metabolic activity for size. Favor synchronous ?primary bronchogenic carcinoma. ?4. No metabolic activity associated with a small LEFT lower lobe ?pulmonary nodule. Recommend attention on follow-up. ?  ?05/06/2016 Imaging  ? MRI brain- Negative for metastatic disease. ?  ?05/28/2016 Procedure  ? 1. Electromagnetic navigational bronchoscopy with brushings, needle ?    aspirations and biopsies of bilateral upper lobe nodules. ?2. Endobronchial ultrasound with mediastinal and hilar lymph node ?    aspirations. ?By Dr. [name] ?  ?06/03/2016 Pathology Results  ? Diagnosis ?1. Lung,  biopsy, Right upper lobe ?- BLOOD AND BENIGN BRONCHIAL AND LUNG TISSUE. ?2. Lung, biopsy, Left upper lobe ?- BLOOD AND BENIGN BRONCHIAL AND LUNG TISSUE. ?  ?06/03/2016 Pathology Results  ? Diagnosis ?TRANSBRONCHIAL NEEDLE ASPIRATION, NAVIGATION, RUL (SPECIMEN 5 OF 7, COLLECTED 05/28/16): ?MALIGNANT CELLS CONSISTENT WITH NON-SMALL CELL CARCINOMA ?Comment ?There are limited tumor cells in the cell block (insufficient for molecular testing). TTF-1 is positive in a few of the ?atypical cells. They appear negative for synaptophysin and cytokeratin 5/6. Overall there is limited tumor, but a lung ?adenocarcinoma is slightly favored. Dr. [name] has reviewed the case. The case was called to Dr. [name] on ?06/03/2016. ?  ?06/13/2016 Procedure  ? Port placed by Dr. Arnoldo Morale ?  ?06/25/2016 - 08/15/2016 Chemotherapy  ? The patient had palonosetron (ALOXI) injection 0.25 mg, 0.25 mg, Intravenous,  Once, 6 of 6 cycles ? ?CARBOplatin (PARAPLATIN) 220 mg in sodium chloride 0.9 % 250 mL chemo infusion, 220 mg (100 % of original dose 215.8 mg), Intravenous,  Once, 6 of 6 cycles ?Dose modification:   (original dose 215.8 mg, Cycle 1),   (original dose 215.8 mg, Cycle 5),   (original dose 215.8 mg, Cycle 6),   (original dose 215.8 mg, Cycle 2),   (original dose 215.8 mg, Cycle 3),   (original dose 215.8 mg, Cycle 4) ? ?PACLitaxel (TAXOL) 90 mg in dextrose 5 % 250 mL chemo infusion ( for chemotherapy treatment.  ? ?  ?06/25/2016 - 08/20/2016 Radiation Therapy  ? Radiation in [place], [place] by Dr. Quitman Livings.  R lung to 6600 cGy ?  ?07/09/2016 - 07/10/2016 Hospital Admission  ? Admit date: 07/09/2016  ?Admission diagnosis: Fever and chills ?Additional comments: Negative work-up, discharged with course of Levaquin ?  ?07/09/2016 Treatment Plan Change  ?  Treatment deferred x 1 week due to hospitalization  ?  ?08/05/2016 - 08/06/2016 Hospital Admission  ? Admit date: 08/05/2016 ?Admission diagnosis: Joint pain ?Additional comments: Treated with steroids with  symptomatic improvement ?  ?08/25/2016 Imaging  ? MRI brain- Negative for intracranial metastasis on this motion degraded ?examination. ? ?Stable mild-to-moderate chronic small vessel ischemic disease. ?  ?09/18/2016 PET scan  ? 1. Partial metabolic response. Right upper lobe 2.6 cm ?hypermetabolic pulmonary nodule, decreased in size and metabolism. ?Mildly hypermetabolic right hilar nodal metastasis, decreased in ?metabolism. Right paratracheal lymph node is no longer ?hypermetabolic. No new or progressive metastatic disease. ?2. Apical left upper lobe 0.9 cm solid pulmonary nodule, slightly ?decreased in size, with no significant metabolism. The change in ?size could indicate a neoplastic etiology for this nodule. ?3. Additional subcentimeter pulmonary nodules in the left lung are ?stable and below PET resolution . ?4. Additional findings include aortic atherosclerosis, coronary ?atherosclerosis, moderate emphysema, stable small pericardial ?effusion/thickening and mild sigmoid diverticulosis. ?  ?01/13/2017 Imaging  ? CT chest with contrast ?IMPRESSION: ?1. No change in size of the hypermetabolic nodular thickening in the ?RIGHT upper lobe. ?2. LEFT upper lobe spiculated nodule is stable. ?3. Rounded LEFT lower lobe pulmonary nodule is stable. ?4. No evidence of lung cancer progression. ?  ?05/08/2017 Adverse Reaction  ? Development of Hypothyroidism- likely secondary to immunotherapy.  Levothyroxine therapy initiated.  ?  ? ? ?CANCER STAGING: ?Cancer Staging  ?Non-small cell lung cancer, right ([place]) ?Staging form: Lung, AJCC 8th Edition ?- Clinical stage from 06/05/2016: Stage IIIA (cT1b(2), cN2, cM0) - Signed by Baird Cancer, PA-C on 08/21/2016 ? ? ?INTERVAL HISTORY:  ?Mr. Frederica Kuster, a 76 y.o. male, returns for routine follow-up of his right NSCLC. Hatcher was last seen on 04/25/2021.  ? ?Today he reports feeling well. He reports a Covid infection the week of 12/13. He reports increased cough productive of  clear sputum over the past 2 months since his Covid infection. He is taking vitamin B-12 daily. He started PT to improve his balance. He takes 1 iron tablet BID. He also reports constipation.  ? ?REVIEW OF SYSTEMS:  ?Review of Systems  ?Constitutional:  Positive for fatigue. Negative for appetite change.  ?Respiratory:  Positive for cough and shortness of breath.   ?Gastrointestinal:  Positive for constipation.  ?Neurological:  Positive for numbness.  ?Psychiatric/Behavioral:  Positive for sleep disturbance.   ?All other systems reviewed and are negative. ? ?PAST MEDICAL/SURGICAL HISTORY:  ?Past Medical History:  ?Diagnosis Date  ? Adenocarcinoma of lung, right ([place]) 04/18/2016  ? Anxiety   ? Arthritis   ? Asthma   ? BPH (benign prostatic hyperplasia)   ? with urinary retention 02/06/20  ? CHF (congestive heart failure) ([place])   ? COPD (chronic obstructive pulmonary disease) ([place])   ? Depression   ? Diabetes mellitus, type II ([place])   ? Dyspnea   ? History of kidney stones   ? History of radiation therapy   ? right lung 09/10/2020-09/17/2020   Dr Sondra Come  ? Hyperlipidemia   ? Hypertension   ? Hypothyroidism   ? Macular degeneration   ? Neuropathy   ? Non-small cell lung cancer, right ([place]) 04/18/2016  ? Peripheral vascular disease ([place])   ? Prostatitis   ? Pulmonary nodule, left 07/16/2016  ? Sleep apnea   ? cpap  ? ?Past Surgical History:  ?Procedure Laterality Date  ? ABDOMINAL AORTOGRAM W/LOWER EXTREMITY Left 02/06/2020  ? Procedure: ABDOMINAL AORTOGRAM W/LOWER EXTREMITY;  Surgeon: Lorretta Harp, MD;  Location: [facility];  Service: Cardiovascular;  Laterality: Left;  ? ABDOMINAL AORTOGRAM W/LOWER EXTREMITY N/A 05/30/2021  ? Procedure: ABDOMINAL AORTOGRAM W/LOWER EXTREMITY;  Surgeon: Marty Heck, MD;  Location: [facility];  Service: Cardiovascular;  Laterality: N/A;  ? CARDIOVERSION N/A 10/05/2020  ? Procedure: CARDIOVERSION;  Surgeon: Geralynn Rile, MD;  Location: [facility];   Service: Cardiovascular;  Laterality: N/A;  ? CATARACT EXTRACTION, BILATERAL Bilateral   ? COLONOSCOPY WITH PROPOFOL N/A 06/20/2021  ? Procedure: COLONOSCOPY WITH PROPOFOL;  Surgeon: Irene Shipper, MD;  [facility]

## 2021-06-25 DIAGNOSIS — I110 Hypertensive heart disease with heart failure: Secondary | ICD-10-CM | POA: Diagnosis not present

## 2021-06-25 DIAGNOSIS — N401 Enlarged prostate with lower urinary tract symptoms: Secondary | ICD-10-CM | POA: Diagnosis not present

## 2021-06-25 DIAGNOSIS — Z7902 Long term (current) use of antithrombotics/antiplatelets: Secondary | ICD-10-CM | POA: Diagnosis not present

## 2021-06-25 DIAGNOSIS — C3490 Malignant neoplasm of unspecified part of unspecified bronchus or lung: Secondary | ICD-10-CM | POA: Diagnosis not present

## 2021-06-25 DIAGNOSIS — E1169 Type 2 diabetes mellitus with other specified complication: Secondary | ICD-10-CM | POA: Diagnosis not present

## 2021-06-25 DIAGNOSIS — F17210 Nicotine dependence, cigarettes, uncomplicated: Secondary | ICD-10-CM | POA: Diagnosis not present

## 2021-06-25 DIAGNOSIS — E039 Hypothyroidism, unspecified: Secondary | ICD-10-CM | POA: Diagnosis not present

## 2021-06-25 DIAGNOSIS — Z7901 Long term (current) use of anticoagulants: Secondary | ICD-10-CM | POA: Diagnosis not present

## 2021-06-25 DIAGNOSIS — I4892 Unspecified atrial flutter: Secondary | ICD-10-CM | POA: Diagnosis not present

## 2021-06-25 DIAGNOSIS — I5032 Chronic diastolic (congestive) heart failure: Secondary | ICD-10-CM | POA: Diagnosis not present

## 2021-06-25 DIAGNOSIS — Z8616 Personal history of COVID-19: Secondary | ICD-10-CM | POA: Diagnosis not present

## 2021-06-25 DIAGNOSIS — D630 Anemia in neoplastic disease: Secondary | ICD-10-CM | POA: Diagnosis not present

## 2021-06-25 DIAGNOSIS — Z7985 Long-term (current) use of injectable non-insulin antidiabetic drugs: Secondary | ICD-10-CM | POA: Diagnosis not present

## 2021-06-25 DIAGNOSIS — N138 Other obstructive and reflux uropathy: Secondary | ICD-10-CM | POA: Diagnosis not present

## 2021-06-25 DIAGNOSIS — R338 Other retention of urine: Secondary | ICD-10-CM | POA: Diagnosis not present

## 2021-06-25 DIAGNOSIS — H3530 Unspecified macular degeneration: Secondary | ICD-10-CM | POA: Diagnosis not present

## 2021-06-25 DIAGNOSIS — E785 Hyperlipidemia, unspecified: Secondary | ICD-10-CM | POA: Diagnosis not present

## 2021-06-25 DIAGNOSIS — E1151 Type 2 diabetes mellitus with diabetic peripheral angiopathy without gangrene: Secondary | ICD-10-CM | POA: Diagnosis not present

## 2021-06-25 DIAGNOSIS — E1142 Type 2 diabetes mellitus with diabetic polyneuropathy: Secondary | ICD-10-CM | POA: Diagnosis not present

## 2021-06-25 DIAGNOSIS — K5900 Constipation, unspecified: Secondary | ICD-10-CM | POA: Diagnosis not present

## 2021-06-25 DIAGNOSIS — F5101 Primary insomnia: Secondary | ICD-10-CM | POA: Diagnosis not present

## 2021-06-25 DIAGNOSIS — G4730 Sleep apnea, unspecified: Secondary | ICD-10-CM | POA: Diagnosis not present

## 2021-06-25 DIAGNOSIS — J449 Chronic obstructive pulmonary disease, unspecified: Secondary | ICD-10-CM | POA: Diagnosis not present

## 2021-06-27 DIAGNOSIS — I5032 Chronic diastolic (congestive) heart failure: Secondary | ICD-10-CM | POA: Diagnosis not present

## 2021-06-27 DIAGNOSIS — C3490 Malignant neoplasm of unspecified part of unspecified bronchus or lung: Secondary | ICD-10-CM | POA: Diagnosis not present

## 2021-06-27 DIAGNOSIS — E1142 Type 2 diabetes mellitus with diabetic polyneuropathy: Secondary | ICD-10-CM | POA: Diagnosis not present

## 2021-06-27 DIAGNOSIS — I110 Hypertensive heart disease with heart failure: Secondary | ICD-10-CM | POA: Diagnosis not present

## 2021-06-27 DIAGNOSIS — E1151 Type 2 diabetes mellitus with diabetic peripheral angiopathy without gangrene: Secondary | ICD-10-CM | POA: Diagnosis not present

## 2021-06-27 DIAGNOSIS — J449 Chronic obstructive pulmonary disease, unspecified: Secondary | ICD-10-CM | POA: Diagnosis not present

## 2021-06-28 DIAGNOSIS — I5032 Chronic diastolic (congestive) heart failure: Secondary | ICD-10-CM | POA: Diagnosis not present

## 2021-06-28 DIAGNOSIS — E1142 Type 2 diabetes mellitus with diabetic polyneuropathy: Secondary | ICD-10-CM | POA: Diagnosis not present

## 2021-06-28 DIAGNOSIS — C3490 Malignant neoplasm of unspecified part of unspecified bronchus or lung: Secondary | ICD-10-CM | POA: Diagnosis not present

## 2021-06-28 DIAGNOSIS — J449 Chronic obstructive pulmonary disease, unspecified: Secondary | ICD-10-CM | POA: Diagnosis not present

## 2021-06-28 DIAGNOSIS — E1151 Type 2 diabetes mellitus with diabetic peripheral angiopathy without gangrene: Secondary | ICD-10-CM | POA: Diagnosis not present

## 2021-06-28 DIAGNOSIS — I110 Hypertensive heart disease with heart failure: Secondary | ICD-10-CM | POA: Diagnosis not present

## 2021-07-01 DIAGNOSIS — J449 Chronic obstructive pulmonary disease, unspecified: Secondary | ICD-10-CM | POA: Diagnosis not present

## 2021-07-01 DIAGNOSIS — E1151 Type 2 diabetes mellitus with diabetic peripheral angiopathy without gangrene: Secondary | ICD-10-CM | POA: Diagnosis not present

## 2021-07-01 DIAGNOSIS — I110 Hypertensive heart disease with heart failure: Secondary | ICD-10-CM | POA: Diagnosis not present

## 2021-07-01 DIAGNOSIS — C3490 Malignant neoplasm of unspecified part of unspecified bronchus or lung: Secondary | ICD-10-CM | POA: Diagnosis not present

## 2021-07-01 DIAGNOSIS — I5032 Chronic diastolic (congestive) heart failure: Secondary | ICD-10-CM | POA: Diagnosis not present

## 2021-07-01 DIAGNOSIS — E1142 Type 2 diabetes mellitus with diabetic polyneuropathy: Secondary | ICD-10-CM | POA: Diagnosis not present

## 2021-07-02 ENCOUNTER — Other Ambulatory Visit: Payer: Self-pay

## 2021-07-02 ENCOUNTER — Ambulatory Visit (HOSPITAL_COMMUNITY)
Admission: RE | Admit: 2021-07-02 | Discharge: 2021-07-02 | Disposition: A | Discharge status: Home / Self Care | Payer: Medicare Other | Source: Ambulatory Visit | Attending: Vascular Surgery | Admitting: Vascular Surgery

## 2021-07-02 ENCOUNTER — Ambulatory Visit (INDEPENDENT_AMBULATORY_CARE_PROVIDER_SITE_OTHER)
Admission: RE | Admit: 2021-07-02 | Discharge: 2021-07-02 | Disposition: A | Discharge status: Home / Self Care | Payer: Medicare Other | Source: Ambulatory Visit | Attending: Vascular Surgery | Admitting: Vascular Surgery

## 2021-07-02 ENCOUNTER — Encounter: Payer: Self-pay | Admitting: Vascular Surgery

## 2021-07-02 ENCOUNTER — Ambulatory Visit (INDEPENDENT_AMBULATORY_CARE_PROVIDER_SITE_OTHER): Payer: Medicare Other | Admitting: Vascular Surgery

## 2021-07-02 VITALS — BP 114/64 | HR 95 | Temp 97.9°F | Resp 18 | Ht 66.0 in | Wt 162.0 lb

## 2021-07-02 DIAGNOSIS — I739 Peripheral vascular disease, unspecified: Secondary | ICD-10-CM | POA: Insufficient documentation

## 2021-07-02 NOTE — Progress Notes (Signed)
? ?Patient name: John [name] MRN: [id number] DOB: [birth year] Sex: male ? ?REASON FOR VISIT: Follow-up after recent right leg intervention ? ?HPI: ?John [name] is a 76 y.o. male with multiple medical comorbidities including hypertension, hyperlipidemia, diabetes, COPD, CHF that presents for follow-up after recent right leg intervention.  Most recently he developed recurrent claudication in the right leg after previous right femoral endarterectomy with retrograde right common iliac stenting on 08/20/2020. ? ?On 05/30/2021 he underwent right proximal common iliac artery angioplasty with drug-coated balloon, multiple right SFA and above-knee popliteal angioplasty with drug-coated balloon.  He feels his right leg is better.  Still having some calf claudication but walked up to a quarter mile yesterday.  He has now stopped his Plavix and is back on aspirin with his Eliquis.  Cut his smoking back to 1 pack every 2 days. ? ?Past Medical History:  ?Diagnosis Date  ? Adenocarcinoma of lung, right ([place]) 04/18/2016  ? Anxiety   ? Arthritis   ? Asthma   ? BPH (benign prostatic hyperplasia)   ? with urinary retention 02/06/20  ? CHF (congestive heart failure) ([place])   ? COPD (chronic obstructive pulmonary disease) ([place])   ? Depression   ? Diabetes mellitus, type II ([place])   ? Dyspnea   ? History of kidney stones   ? History of radiation therapy   ? right lung 09/10/2020-09/17/2020   Dr Sondra Come  ? Hyperlipidemia   ? Hypertension   ? Hypothyroidism   ? Macular degeneration   ? Neuropathy   ? Non-small cell lung cancer, right ([place]) 04/18/2016  ? Peripheral vascular disease ([place])   ? Prostatitis   ? Pulmonary nodule, left 07/16/2016  ? Sleep apnea   ? cpap  ? ? ?Past Surgical History:  ?Procedure Laterality Date  ? ABDOMINAL AORTOGRAM W/LOWER EXTREMITY Left 02/06/2020  ? Procedure: ABDOMINAL AORTOGRAM W/LOWER EXTREMITY;  Surgeon: Lorretta Harp, MD;  Location: [facility];  Service: Cardiovascular;  Laterality: Left;  ?  ABDOMINAL AORTOGRAM W/LOWER EXTREMITY N/A 05/30/2021  ? Procedure: ABDOMINAL AORTOGRAM W/LOWER EXTREMITY;  Surgeon: Marty Heck, MD;  Location: [facility];  Service: Cardiovascular;  Laterality: N/A;  ? CARDIOVERSION N/A 10/05/2020  ? Procedure: CARDIOVERSION;  Surgeon: Geralynn Rile, MD;  Location: [facility];  Service: Cardiovascular;  Laterality: N/A;  ? CATARACT EXTRACTION, BILATERAL Bilateral   ? COLONOSCOPY WITH PROPOFOL N/A 06/20/2021  ? Procedure: COLONOSCOPY WITH PROPOFOL;  Surgeon: Irene Shipper, MD;  [facility];  Service: Endoscopy;  Laterality: N/A;  ? ENDARTERECTOMY FEMORAL Right 08/20/2020  ? Procedure: ENDARTERECTOMY  RIGHT FEMORAL ARTERY;  Surgeon: Marty Heck, MD;  Location: [facility];  Service: Vascular;  Laterality: Right;  ? ESOPHAGOGASTRODUODENOSCOPY (EGD) WITH PROPOFOL N/A 09/06/2020  ? Procedure: ESOPHAGOGASTRODUODENOSCOPY (EGD) WITH PROPOFOL;  Surgeon: Irene Shipper, MD;  Location: [facility];  Service: Endoscopy;  Laterality: N/A;  ? INSERTION OF ILIAC STENT Right 08/20/2020  ? Procedure: RETROGRADE INSERTION OF RIGHT ILIAC STENT;  Surgeon: Marty Heck, MD;  Location: [facility];  Service: Vascular;  Laterality: Right;  ? INTRAOPERATIVE ARTERIOGRAM Right 08/20/2020  ? Procedure: INTRA OPERATIVE ARTERIOGRAM ILIAC;  Surgeon: Marty Heck, MD;  Location: [facility];  Service: Vascular;  Laterality: Right;  ? PATCH ANGIOPLASTY Right 08/20/2020  ? Procedure: PATCH ANGIOPLASTY RIGHT FEMORAL ARTERY;  Surgeon: Marty Heck, MD;  Location: [facility];  Service: Vascular;  Laterality: Right;  ? PERIPHERAL VASCULAR BALLOON ANGIOPLASTY Right 05/30/2021  ? Procedure:  PERIPHERAL VASCULAR BALLOON ANGIOPLASTY;  Surgeon: Marty Heck, MD;  Location: [facility];  Service: Cardiovascular;  Laterality: Right;  ? PORTACATH PLACEMENT Left 06/13/2016  ? Procedure: INSERTION PORT-A-CATH;  Surgeon: Aviva Signs, MD;  [facility];  Service: General;   Laterality: Left;  ? TRANSURETHRAL RESECTION OF PROSTATE N/A 05/31/2020  ? Procedure: TRANSURETHRAL RESECTION OF THE PROSTATE (TURP);  Surgeon: Cleon Gustin, MD;  [facility];  Service: Urology;  Laterality: N/A;  ? VIDEO BRONCHOSCOPY WITH ENDOBRONCHIAL NAVIGATION N/A 05/28/2016  ? Procedure: VIDEO BRONCHOSCOPY WITH ENDOBRONCHIAL NAVIGATION;  Surgeon: [name], MD;  Location: [facility];  Service: Thoracic;  Laterality: N/A;  ? VIDEO BRONCHOSCOPY WITH ENDOBRONCHIAL ULTRASOUND N/A 05/28/2016  ? Procedure: VIDEO BRONCHOSCOPY WITH ENDOBRONCHIAL ULTRASOUND;  Surgeon: [name], MD;  Location: [facility];  Service: Thoracic;  Laterality: N/A;  ? ? ?Family History  ?Problem Relation Age of Onset  ? Hypertension Mother   ? Diabetes Father   ? Heart disease Father   ? Stroke Father   ? Hypertension Sister   ? ? ?SOCIAL HISTORY: ?Social History  ? ?Tobacco Use  ? Smoking status: Every Day  ?  Packs/day: 0.50  ?  Years: 55.00  ?  Pack years: 27.50  ?  Types: Cigarettes  ?  Start date: 03/11/1961  ? Smokeless tobacco: Never  ? Tobacco comments:  ?  1/2 pack to 1 pack per day 12/14/19  ?Substance Use Topics  ? Alcohol use: No  ? ? ?Allergies  ?Allergen Reactions  ? Sulfa Antibiotics Swelling  ?  Mouth swelling  ? Atorvastatin Other (See Comments)  ?  Muscle aches - tolerating Pravastatin 40 mg MWF  ? Jardiance [Empagliflozin] Other (See Comments)  ?  FEELS SLUGGISH, TIRED  ? Lopressor [Metoprolol] Other (See Comments)  ?  Fatigue  ? Rosuvastatin Other (See Comments)  ?  Muscle aches - tolerating Pravastatin 40 mg MWF  ? Temazepam Other (See Comments)  ?  Made insomnia worse   ? ? ?Current Outpatient Medications  ?Medication Sig Dispense Refill  ? albuterol (VENTOLIN HFA) 108 (90 Base) MCG/ACT inhaler Inhale 2 puffs into the lungs every 4 (four) hours as needed for wheezing or shortness of breath. 8 g 6  ? apixaban (ELIQUIS) 5 MG TABS tablet TAKE 1 TABLET BY MOUTH TWICE A DAY 60 tablet 5  ? aspirin EC 81 MG  tablet Take 81 mg by mouth daily. Swallow whole.    ? baclofen (LIORESAL) 10 MG tablet TAKE 0.5-1 TABLETS (5-10 MG TOTAL) BY MOUTH 2 (TWO) TIMES DAILY AS NEEDED FOR MUSCLE SPASMS. 90 tablet 2  ? Blood Glucose Monitoring Suppl (ONETOUCH VERIO REFLECT) w/Device KIT Use to test blood sugars daily as directed. DX: E11.9 1 kit 1  ? Cyanocobalamin (B-12 PO) Take 1 tablet by mouth daily.    ? fluticasone (FLONASE) 50 MCG/ACT nasal spray PLACE 1 SPRAY INTO BOTH NOSTRILS DAILY AS NEEDED FOR ALLERGIES OR RHINITIS. 48 mL 3  ? Fluticasone-Umeclidin-Vilant (TRELEGY ELLIPTA) 100-62.5-25 MCG/INH AEPB Inhale 1 puff into the lungs daily. 1 each 11  ? gabapentin (NEURONTIN) 300 MG capsule TAKE 1 CAPSULE BY MOUTH THREE TIMES A DAY 270 capsule 0  ? hydrOXYzine (VISTARIL) 25 MG capsule Take 1 capsule (25 mg total) by mouth 3 (three) times daily as needed for itching. 60 capsule 1  ? Iron, Ferrous Sulfate, 325 (65 Fe) MG TABS Take 325 mg by mouth 2 (two) times daily. 180 tablet 1  ?  KLOR-CON M20 20 MEQ tablet TAKE 1 TABLET BY MOUTH EVERY DAY 90 tablet 0  ? levocetirizine (XYZAL) 5 MG tablet Take 1 tablet (5 mg total) by mouth at bedtime. (Patient taking differently: Take 5 mg by mouth daily as needed for allergies.) 30 tablet 2  ? levothyroxine (SYNTHROID) 50 MCG tablet TAKE 1 TABLET BY MOUTH EVERY DAY 90 tablet 1  ? linaclotide (LINZESS) 145 MCG CAPS capsule Take 1 capsule (145 mcg total) by mouth daily. To regulate bowel movements (Patient taking differently: Take 145 mcg by mouth daily as needed (constipation). To regulate bowel movements) 30 capsule 5  ? Melatonin 5 MG CAPS Take 5-10 mg by mouth at bedtime.    ? nebivolol (BYSTOLIC) 2.5 MG tablet Take 1 tablet (2.5 mg total) by mouth daily. 30 tablet 6  ? OneTouch Delica Lancets 61P MISC Use to test blood sugars daily as directed. DX: E11.9 100 each 1  ? ONETOUCH VERIO test strip USE TO TEST BLOOD SUGARS DAILY AS DIRECTED. DX: E11.9 100 strip 12  ? pantoprazole (PROTONIX) 40 MG  tablet Take 1 tablet (40 mg total) by mouth daily. 90 tablet 3  ? pravastatin (PRAVACHOL) 40 MG tablet TAKE 1 TABLET (40 MG TOTAL) BY MOUTH ON MONDAYS , WEDNESDAYS, AND FRIDAYS AS DIRECTED 48 tablet 0  ? Semaglutid

## 2021-07-04 DIAGNOSIS — C3490 Malignant neoplasm of unspecified part of unspecified bronchus or lung: Secondary | ICD-10-CM | POA: Diagnosis not present

## 2021-07-04 DIAGNOSIS — I110 Hypertensive heart disease with heart failure: Secondary | ICD-10-CM | POA: Diagnosis not present

## 2021-07-04 DIAGNOSIS — J449 Chronic obstructive pulmonary disease, unspecified: Secondary | ICD-10-CM | POA: Diagnosis not present

## 2021-07-04 DIAGNOSIS — E1142 Type 2 diabetes mellitus with diabetic polyneuropathy: Secondary | ICD-10-CM | POA: Diagnosis not present

## 2021-07-04 DIAGNOSIS — I5032 Chronic diastolic (congestive) heart failure: Secondary | ICD-10-CM | POA: Diagnosis not present

## 2021-07-04 DIAGNOSIS — E1151 Type 2 diabetes mellitus with diabetic peripheral angiopathy without gangrene: Secondary | ICD-10-CM | POA: Diagnosis not present

## 2021-07-05 ENCOUNTER — Other Ambulatory Visit: Payer: Self-pay | Admitting: *Deleted

## 2021-07-05 DIAGNOSIS — J449 Chronic obstructive pulmonary disease, unspecified: Secondary | ICD-10-CM | POA: Diagnosis not present

## 2021-07-05 DIAGNOSIS — I110 Hypertensive heart disease with heart failure: Secondary | ICD-10-CM | POA: Diagnosis not present

## 2021-07-05 DIAGNOSIS — I5032 Chronic diastolic (congestive) heart failure: Secondary | ICD-10-CM | POA: Diagnosis not present

## 2021-07-05 DIAGNOSIS — E1151 Type 2 diabetes mellitus with diabetic peripheral angiopathy without gangrene: Secondary | ICD-10-CM | POA: Diagnosis not present

## 2021-07-05 DIAGNOSIS — E1142 Type 2 diabetes mellitus with diabetic polyneuropathy: Secondary | ICD-10-CM | POA: Diagnosis not present

## 2021-07-05 DIAGNOSIS — I739 Peripheral vascular disease, unspecified: Secondary | ICD-10-CM

## 2021-07-05 DIAGNOSIS — C3490 Malignant neoplasm of unspecified part of unspecified bronchus or lung: Secondary | ICD-10-CM | POA: Diagnosis not present

## 2021-07-12 DIAGNOSIS — C3490 Malignant neoplasm of unspecified part of unspecified bronchus or lung: Secondary | ICD-10-CM | POA: Diagnosis not present

## 2021-07-12 DIAGNOSIS — E1151 Type 2 diabetes mellitus with diabetic peripheral angiopathy without gangrene: Secondary | ICD-10-CM | POA: Diagnosis not present

## 2021-07-12 DIAGNOSIS — E1142 Type 2 diabetes mellitus with diabetic polyneuropathy: Secondary | ICD-10-CM | POA: Diagnosis not present

## 2021-07-12 DIAGNOSIS — J449 Chronic obstructive pulmonary disease, unspecified: Secondary | ICD-10-CM | POA: Diagnosis not present

## 2021-07-12 DIAGNOSIS — I5032 Chronic diastolic (congestive) heart failure: Secondary | ICD-10-CM | POA: Diagnosis not present

## 2021-07-12 DIAGNOSIS — I110 Hypertensive heart disease with heart failure: Secondary | ICD-10-CM | POA: Diagnosis not present

## 2021-07-16 ENCOUNTER — Other Ambulatory Visit: Payer: Self-pay | Admitting: Family

## 2021-07-16 ENCOUNTER — Other Ambulatory Visit: Payer: Self-pay | Admitting: Cardiovascular Disease

## 2021-07-16 ENCOUNTER — Other Ambulatory Visit: Payer: Self-pay | Admitting: Student

## 2021-07-16 DIAGNOSIS — E1169 Type 2 diabetes mellitus with other specified complication: Secondary | ICD-10-CM

## 2021-07-16 DIAGNOSIS — E1142 Type 2 diabetes mellitus with diabetic polyneuropathy: Secondary | ICD-10-CM

## 2021-07-19 DIAGNOSIS — J449 Chronic obstructive pulmonary disease, unspecified: Secondary | ICD-10-CM | POA: Diagnosis not present

## 2021-07-19 DIAGNOSIS — E1151 Type 2 diabetes mellitus with diabetic peripheral angiopathy without gangrene: Secondary | ICD-10-CM | POA: Diagnosis not present

## 2021-07-19 DIAGNOSIS — E1142 Type 2 diabetes mellitus with diabetic polyneuropathy: Secondary | ICD-10-CM | POA: Diagnosis not present

## 2021-07-19 DIAGNOSIS — C3490 Malignant neoplasm of unspecified part of unspecified bronchus or lung: Secondary | ICD-10-CM | POA: Diagnosis not present

## 2021-07-19 DIAGNOSIS — I5032 Chronic diastolic (congestive) heart failure: Secondary | ICD-10-CM | POA: Diagnosis not present

## 2021-07-19 DIAGNOSIS — I110 Hypertensive heart disease with heart failure: Secondary | ICD-10-CM | POA: Diagnosis not present

## 2021-07-24 ENCOUNTER — Encounter (HOSPITAL_COMMUNITY): Payer: Self-pay

## 2021-07-24 ENCOUNTER — Inpatient Hospital Stay (HOSPITAL_COMMUNITY): Payer: Medicare Other | Attending: Hematology

## 2021-07-24 VITALS — BP 123/65 | HR 92 | Temp 96.3°F | Resp 18

## 2021-07-24 DIAGNOSIS — Z85118 Personal history of other malignant neoplasm of bronchus and lung: Secondary | ICD-10-CM | POA: Insufficient documentation

## 2021-07-24 DIAGNOSIS — Z452 Encounter for adjustment and management of vascular access device: Secondary | ICD-10-CM | POA: Diagnosis not present

## 2021-07-24 DIAGNOSIS — C3491 Malignant neoplasm of unspecified part of right bronchus or lung: Secondary | ICD-10-CM

## 2021-07-24 DIAGNOSIS — Z95828 Presence of other vascular implants and grafts: Secondary | ICD-10-CM

## 2021-07-24 DIAGNOSIS — Z20822 Contact with and (suspected) exposure to covid-19: Secondary | ICD-10-CM | POA: Diagnosis not present

## 2021-07-24 MED ORDER — SODIUM CHLORIDE 0.9% FLUSH
10.0000 mL | Freq: Once | INTRAVENOUS | Status: AC
  Administered 2021-07-24: 10 mL via INTRAVENOUS

## 2021-07-24 MED ORDER — HEPARIN SOD (PORK) LOCK FLUSH 100 UNIT/ML IV SOLN
500.0000 [IU] | Freq: Once | INTRAVENOUS | Status: AC
  Administered 2021-07-24: 500 [IU] via INTRAVENOUS

## 2021-07-24 NOTE — Progress Notes (Signed)
Patients port flushed without difficulty.  Good blood return noted with no bruising or swelling noted at site.  Band aid applied.  VSS with discharge and left in satisfactory condition with no s/s of distress noted.   

## 2021-07-24 NOTE — Patient Instructions (Signed)
[facility]  Discharge Instructions: Thank you for choosing [facility] to provide your oncology and hematology care.  If you have a lab appointment with the Cancer Center, please come in thru the Main Entrance and check in at the main information desk.  Wear comfortable clothing and clothing appropriate for easy access to any Portacath or PICC line.   We strive to give you quality time with your provider. You may need to reschedule your appointment if you arrive late (15 or more minutes).  Arriving late affects you and other patients whose appointments are after yours.  Also, if you miss three or more appointments without notifying the office, you may be dismissed from the clinic at the provider's discretion.      For prescription refill requests, have your pharmacy contact our office and allow 72 hours for refills to be completed.        To help prevent nausea and vomiting after your treatment, we encourage you to take your nausea medication as directed.  BELOW ARE SYMPTOMS THAT SHOULD BE REPORTED IMMEDIATELY: *FEVER GREATER THAN 100.4 F (38 C) OR HIGHER *CHILLS OR SWEATING *NAUSEA AND VOMITING THAT IS NOT CONTROLLED WITH YOUR NAUSEA MEDICATION *UNUSUAL SHORTNESS OF BREATH *UNUSUAL BRUISING OR BLEEDING *URINARY PROBLEMS (pain or burning when urinating, or frequent urination) *BOWEL PROBLEMS (unusual diarrhea, constipation, pain near the anus) TENDERNESS IN MOUTH AND THROAT WITH OR WITHOUT PRESENCE OF ULCERS (sore throat, sores in mouth, or a toothache) UNUSUAL RASH, SWELLING OR PAIN  UNUSUAL VAGINAL DISCHARGE OR ITCHING   Items with * indicate a potential emergency and should be followed up as soon as possible or go to the Emergency Department if any problems should occur.  Please show the CHEMOTHERAPY ALERT CARD or IMMUNOTHERAPY ALERT CARD at check-in to the Emergency Department and triage nurse.  Should you have questions after your visit or need to cancel  or reschedule your appointment, please contact [facility] [phone]  and follow the prompts.  Office hours are 8:00 a.m. to 4:30 p.m. Monday - Friday. Please note that voicemails left after 4:00 p.m. may not be returned until the following business day.  We are closed weekends and major holidays. You have access to a nurse at all times for urgent questions. Please call the main number to the clinic [phone] and follow the prompts.  For any non-urgent questions, you may also contact your provider using MyChart. We now offer e-Visits for anyone 18 and older to request care online for non-urgent symptoms. For details visit [email or web address].   Also download the MyChart app! Go to the app store, search "MyChart", open the app, select , and log in with your MyChart username and password.  Due to Covid, a mask is required upon entering the hospital/clinic. If you do not have a mask, one will be given to you upon arrival. For doctor visits, patients may have 1 support person aged 18 or older with them. For treatment visits, patients cannot have anyone with them due to current Covid guidelines and our immunocompromised population.  

## 2021-07-30 ENCOUNTER — Ambulatory Visit (INDEPENDENT_AMBULATORY_CARE_PROVIDER_SITE_OTHER): Payer: Medicare Other | Admitting: Family

## 2021-07-30 ENCOUNTER — Encounter: Payer: Self-pay | Admitting: Family

## 2021-07-30 VITALS — BP 130/61 | HR 106 | Temp 97.7°F | Ht 66.0 in | Wt 164.8 lb

## 2021-07-30 DIAGNOSIS — E1142 Type 2 diabetes mellitus with diabetic polyneuropathy: Secondary | ICD-10-CM

## 2021-07-30 DIAGNOSIS — I5032 Chronic diastolic (congestive) heart failure: Secondary | ICD-10-CM | POA: Diagnosis not present

## 2021-07-30 DIAGNOSIS — F5101 Primary insomnia: Secondary | ICD-10-CM

## 2021-07-30 DIAGNOSIS — R110 Nausea: Secondary | ICD-10-CM | POA: Diagnosis not present

## 2021-07-30 MED ORDER — ONDANSETRON HCL 4 MG PO TABS: 4.0000 mg | ORAL_TABLET | Freq: Three times a day (TID) | ORAL | 0 refills | Status: DC | PRN

## 2021-07-30 MED ORDER — TRAZODONE HCL 50 MG PO TABS: 50.0000 mg | ORAL_TABLET | Freq: Every evening | ORAL | 3 refills | Status: DC | PRN

## 2021-07-30 NOTE — Progress Notes (Signed)
? ?Subjective:  ? ? Patient ID: John [name], male    DOB: June 20, 1945, 76 y.o.   MRN: [id number] ? ?Chief Complaint  ?Patient presents with  ? Insomnia  ? ?PT presents to the office today with complaints of insomnia. He stopped his melatonin 5 mg without relief.  He reports his demadex 20 mg to once a day and just two as needed. He states he has not urinated like he should and has been taking an extra in the evening. Denies any weight gain or SOB.  ? ?He is complaining of intermittent nausea. Denies any vomiting or fevers.  ?Insomnia ?Primary symptoms: difficulty falling asleep, frequent awakening.   ?The current episode started more than one year. The onset quality is gradual. The problem occurs intermittently.  ?Diabetes ?He presents for his follow-up diabetic visit. He has type 2 diabetes mellitus. Associated symptoms include foot paresthesias. Pertinent negatives for diabetes include no blurred vision. Symptoms are stable. Risk factors for coronary artery disease include dyslipidemia, diabetes mellitus, hypertension, male sex and sedentary lifestyle. He is following a generally unhealthy diet. His overall blood glucose range is 140-180 mg/dl.  ? ? ? ?Review of Systems  ?Eyes:  Negative for blurred vision.  ?Psychiatric/Behavioral:  The patient has insomnia.   ?All other systems reviewed and are negative. ? ?   ?Objective:  ? Physical Exam ?Vitals reviewed.  ?Constitutional:   ?   General: He is not in acute distress. ?   Appearance: He is well-developed.  ?HENT:  ?   Head: Normocephalic.  ?   Right Ear: Tympanic membrane normal.  ?   Left Ear: Tympanic membrane normal.  ?Eyes:  ?   General:     ?   Right eye: No discharge.     ?   Left eye: No discharge.  ?   Pupils: Pupils are equal, round, and reactive to light.  ?Neck:  ?   Thyroid: No thyromegaly.  ?Cardiovascular:  ?   Rate and Rhythm: Normal rate and regular rhythm.  ?   Heart sounds: Normal heart sounds. No murmur heard. ?Pulmonary:  ?   Effort:  Pulmonary effort is normal. No respiratory distress.  ?   Breath sounds: Normal breath sounds. No wheezing.  ?Abdominal:  ?   General: Bowel sounds are normal. There is no distension.  ?   Palpations: Abdomen is soft.  ?   Tenderness: There is no abdominal tenderness.  ?Musculoskeletal:     ?   General: No tenderness. Normal range of motion.  ?   Cervical back: Normal range of motion and neck supple.  ?   Right lower leg: Edema (trace) present.  ?   Left lower leg: Edema (trace) present.  ?Skin: ?   General: Skin is warm and dry.  ?   Findings: No erythema or rash.  ?Neurological:  ?   Mental Status: He is alert and oriented to person, place, and time.  ?   Cranial Nerves: No cranial nerve deficit.  ?   Motor: Weakness (using cane) present.  ?   Gait: Gait abnormal.  ?   Deep Tendon Reflexes: Reflexes are normal and symmetric.  ?Psychiatric:     ?   Behavior: Behavior normal.     ?   Thought Content: Thought content normal.     ?   Judgment: Judgment normal.  ? ? ? ?  BP 130/61   Pulse (!) 106   Temp 97.7 ?F (36.5 ?C) (Temporal)  Ht 5\' 6"  (1.676 m)   Wt 164 lb 12.8 oz (74.8 kg)   BMI 26.60 kg/m?  ? ?Assessment & Plan:  ?John [name] comes in today with chief complaint of Insomnia ? ? ?Diagnosis and orders addressed: ? ?1. Chronic diastolic CHF (congestive heart failure) ([place]) ? ?2. Type 2 diabetes mellitus with diabetic polyneuropathy, without long-term current use of insulin ([place]) ? ?3. Primary insomnia ?Start trazodone 50 mg, increase to 100 mg  ?Sleep ritual discussed  ?- traZODone (DESYREL) 50 MG tablet; Take 1-2 tablets (50-100 mg total) by mouth at bedtime as needed for sleep.  Dispense: 60 tablet; Refill: 3 ? ?4. Nausea ?Zofran as needed  ?- ondansetron (ZOFRAN) 4 MG tablet; Take 1 tablet (4 mg total) by mouth every 8 (eight) hours as needed for nausea or vomiting.  Dispense: 20 tablet; Refill: 0 ? ? ?Labs pending ?Health Maintenance reviewed ?Diet and exercise encouraged ? ?Follow up plan: ?Keep  chronic follow up ? ?Evelina Dun, FNP ? ? ?

## 2021-07-30 NOTE — Patient Instructions (Signed)
Insomnia Insomnia is a sleep disorder that makes it difficult to fall asleep or stay asleep. Insomnia can cause fatigue, low energy, difficulty concentrating, mood swings, and poor performance at work or school. There are three different ways to classify insomnia: Difficulty falling asleep. Difficulty staying asleep. Waking up too early in the morning. Any type of insomnia can be long-term (chronic) or short-term (acute). Both are common. Short-term insomnia usually lasts for 3 months or less. Chronic insomnia occurs at least three times a week for longer than 3 months. What are the causes? Insomnia may be caused by another condition, situation, or substance, such as: Having certain mental health conditions, such as anxiety and depression. Using caffeine, alcohol, tobacco, or drugs. Having gastrointestinal conditions, such as gastroesophageal reflux disease (GERD). Having certain medical conditions. These include: Asthma. Alzheimer's disease. Stroke. Chronic pain. An overactive thyroid gland (hyperthyroidism). Other sleep disorders, such as restless legs syndrome and sleep apnea. Menopause. Sometimes, the cause of insomnia may not be known. What increases the risk? Risk factors for insomnia include: Gender. Females are affected more often than males. Age. Insomnia is more common as people get older. Stress and certain medical and mental health conditions. Lack of exercise. Having an irregular work schedule. This may include working night shifts and traveling between different time zones. What are the signs or symptoms? If you have insomnia, the main symptom is having trouble falling asleep or having trouble staying asleep. This may lead to other symptoms, such as: Feeling tired or having low energy. Feeling nervous about going to sleep. Not feeling rested in the morning. Having trouble concentrating. Feeling irritable, anxious, or depressed. How is this diagnosed? This condition  may be diagnosed based on: Your symptoms and medical history. Your health care provider may ask about: Your sleep habits. Any medical conditions you have. Your mental health. A physical exam. How is this treated? Treatment for insomnia depends on the cause. Treatment may focus on treating an underlying condition that is causing the insomnia. Treatment may also include: Medicines to help you sleep. Counseling or therapy. Lifestyle adjustments to help you sleep better. Follow these instructions at home: Eating and drinking  Limit or avoid alcohol, caffeinated beverages, and products that contain nicotine and tobacco, especially close to bedtime. These can disrupt your sleep. Do not eat a large meal or eat spicy foods right before bedtime. This can lead to digestive discomfort that can make it hard for you to sleep. Sleep habits  Keep a sleep diary to help you and your health care provider figure out what could be causing your insomnia. Write down: When you sleep. When you wake up during the night. How well you sleep and how rested you feel the next day. Any side effects of medicines you are taking. What you eat and drink. Make your bedroom a dark, comfortable place where it is easy to fall asleep. Put up shades or blackout curtains to block light from outside. Use a white noise machine to block noise. Keep the temperature cool. Limit screen use before bedtime. This includes: Not watching TV. Not using your smartphone, tablet, or computer. Stick to a routine that includes going to bed and waking up at the same times every day and night. This can help you fall asleep faster. Consider making a quiet activity, such as reading, part of your nighttime routine. Try to avoid taking naps during the day so that you sleep better at night. Get out of bed if you are still awake after   15 minutes of trying to sleep. Keep the lights down, but try reading or doing a quiet activity. When you feel  sleepy, go back to bed. General instructions Take over-the-counter and prescription medicines only as told by your health care provider. Exercise regularly as told by your health care provider. However, avoid exercising in the hours right before bedtime. Use relaxation techniques to manage stress. Ask your health care provider to suggest some techniques that may work well for you. These may include: Breathing exercises. Routines to release muscle tension. Visualizing peaceful scenes. Make sure that you drive carefully. Do not drive if you feel very sleepy. Keep all follow-up visits. This is important. Contact a health care provider if: You are tired throughout the day. You have trouble in your daily routine due to sleepiness. You continue to have sleep problems, or your sleep problems get worse. Get help right away if: You have thoughts about hurting yourself or someone else. Get help right away if you feel like you may hurt yourself or others, or have thoughts about taking your own life. Go to your nearest emergency room or: Call 911. Call the National Suicide Prevention Lifeline at 1-800-273-8255 or 988. This is open 24 hours a day. Text the Crisis Text Line at 741741. Summary Insomnia is a sleep disorder that makes it difficult to fall asleep or stay asleep. Insomnia can be long-term (chronic) or short-term (acute). Treatment for insomnia depends on the cause. Treatment may focus on treating an underlying condition that is causing the insomnia. Keep a sleep diary to help you and your health care provider figure out what could be causing your insomnia. This information is not intended to replace advice given to you by your health care provider. Make sure you discuss any questions you have with your health care provider. Document Revised: 03/04/2021 Document Reviewed: 03/04/2021 Elsevier Patient Education  2023 Elsevier Inc.  

## 2021-08-02 DIAGNOSIS — Z20822 Contact with and (suspected) exposure to covid-19: Secondary | ICD-10-CM | POA: Diagnosis not present

## 2021-08-05 DIAGNOSIS — Z20822 Contact with and (suspected) exposure to covid-19: Secondary | ICD-10-CM | POA: Diagnosis not present

## 2021-08-06 DIAGNOSIS — E1142 Type 2 diabetes mellitus with diabetic polyneuropathy: Secondary | ICD-10-CM | POA: Diagnosis not present

## 2021-08-06 DIAGNOSIS — L84 Corns and callosities: Secondary | ICD-10-CM | POA: Diagnosis not present

## 2021-08-06 DIAGNOSIS — M79676 Pain in unspecified toe(s): Secondary | ICD-10-CM | POA: Diagnosis not present

## 2021-08-06 DIAGNOSIS — B351 Tinea unguium: Secondary | ICD-10-CM | POA: Diagnosis not present

## 2021-08-08 DIAGNOSIS — Z20822 Contact with and (suspected) exposure to covid-19: Secondary | ICD-10-CM | POA: Diagnosis not present

## 2021-08-12 ENCOUNTER — Encounter: Payer: Self-pay | Admitting: Family

## 2021-08-12 ENCOUNTER — Ambulatory Visit (INDEPENDENT_AMBULATORY_CARE_PROVIDER_SITE_OTHER): Payer: Medicare Other

## 2021-08-12 ENCOUNTER — Ambulatory Visit (INDEPENDENT_AMBULATORY_CARE_PROVIDER_SITE_OTHER): Payer: Medicare Other | Admitting: Family

## 2021-08-12 VITALS — BP 137/73 | HR 79 | Temp 97.6°F | Ht 66.0 in | Wt 168.2 lb

## 2021-08-12 DIAGNOSIS — J441 Chronic obstructive pulmonary disease with (acute) exacerbation: Secondary | ICD-10-CM

## 2021-08-12 DIAGNOSIS — R531 Weakness: Secondary | ICD-10-CM | POA: Diagnosis not present

## 2021-08-12 DIAGNOSIS — R5383 Other fatigue: Secondary | ICD-10-CM | POA: Diagnosis not present

## 2021-08-12 DIAGNOSIS — R059 Cough, unspecified: Secondary | ICD-10-CM | POA: Diagnosis not present

## 2021-08-12 LAB — CBC WITH DIFFERENTIAL/PLATELET
Basophils Absolute: 0 10*3/uL (ref 0.0–0.2)
Basos: 1 %
EOS (ABSOLUTE): 0.2 10*3/uL (ref 0.0–0.4)
Eos: 3 %
Hematocrit: 32.3 % — ABNORMAL LOW (ref 37.5–51.0)
Hemoglobin: 10.4 g/dL — ABNORMAL LOW (ref 13.0–17.7)
Immature Grans (Abs): 0 10*3/uL (ref 0.0–0.1)
Immature Granulocytes: 0 %
Lymphocytes Absolute: 0.5 10*3/uL — ABNORMAL LOW (ref 0.7–3.1)
Lymphs: 8 %
MCH: 34.3 pg — ABNORMAL HIGH (ref 26.6–33.0)
MCHC: 32.2 g/dL (ref 31.5–35.7)
MCV: 107 fL — ABNORMAL HIGH (ref 79–97)
Monocytes Absolute: 0.8 10*3/uL (ref 0.1–0.9)
Monocytes: 12 %
Neutrophils Absolute: 4.8 10*3/uL (ref 1.4–7.0)
Neutrophils: 76 %
Platelets: 318 10*3/uL (ref 150–450)
RBC: 3.03 x10E6/uL — ABNORMAL LOW (ref 4.14–5.80)
RDW: 12.6 % (ref 11.6–15.4)
WBC: 6.3 10*3/uL (ref 3.4–10.8)

## 2021-08-12 LAB — HEMOGLOBIN, FINGERSTICK: Hemoglobin: 10.2 g/dL — ABNORMAL LOW (ref 12.6–17.7)

## 2021-08-12 MED ORDER — DOXYCYCLINE HYCLATE 100 MG PO TABS: 100.0000 mg | ORAL_TABLET | Freq: Two times a day (BID) | ORAL | 0 refills | Status: DC

## 2021-08-12 NOTE — Progress Notes (Signed)
? ?Subjective:  ? ? Patient ID: John [name], male    DOB: 12-03-1945, 76 y.o.   MRN: [id number] ? ?Chief Complaint  ?Patient presents with  ? Fatigue  ? Chills  ?  Staying chilly, want hgb checked  ? ?PT presents to the office today with increased fatigue and weakness. States his CPAP has not been working "right" and has ordered a new mask that should fix the problem. ? ?Cough ?This is a chronic problem. The current episode started more than 1 month ago. The problem has been waxing and waning. The problem occurs constantly. The cough is Productive of sputum. Associated symptoms include chills, nasal congestion, postnasal drip, shortness of breath and wheezing. Pertinent negatives include no ear congestion, ear pain, fever, headaches or myalgias. Risk factors for lung disease include smoking/tobacco exposure. He has tried rest and OTC cough suppressant for the symptoms. The treatment provided mild relief.  ?Nicotine Dependence ?Presents for follow-up visit. His urge triggers include company of smokers. The symptoms have been stable. He smokes < 1/2 a pack of cigarettes per day.  ? ? ? ?Review of Systems  ?Constitutional:  Positive for chills. Negative for fever.  ?HENT:  Positive for postnasal drip. Negative for ear pain.   ?Respiratory:  Positive for cough, shortness of breath and wheezing.   ?Musculoskeletal:  Negative for myalgias.  ?Neurological:  Negative for headaches.  ?All other systems reviewed and are negative. ? ?   ?Objective:  ? Physical Exam ?Vitals reviewed.  ?Constitutional:   ?   General: He is not in acute distress. ?   Appearance: He is well-developed.  ?HENT:  ?   Head: Normocephalic.  ?   Right Ear: Tympanic membrane normal.  ?   Left Ear: Tympanic membrane normal.  ?Eyes:  ?   General:     ?   Right eye: No discharge.     ?   Left eye: No discharge.  ?   Pupils: Pupils are equal, round, and reactive to light.  ?Neck:  ?   Thyroid: No thyromegaly.  ?Cardiovascular:  ?   Rate and Rhythm:  Normal rate and regular rhythm.  ?   Heart sounds: Normal heart sounds. No murmur heard. ?Pulmonary:  ?   Effort: Pulmonary effort is normal. No respiratory distress.  ?   Breath sounds: Rhonchi present. No wheezing.  ?Abdominal:  ?   General: Bowel sounds are normal. There is no distension.  ?   Palpations: Abdomen is soft.  ?   Tenderness: There is no abdominal tenderness.  ?Musculoskeletal:     ?   General: No tenderness. Normal range of motion.  ?   Cervical back: Normal range of motion and neck supple.  ?Skin: ?   General: Skin is warm and dry.  ?   Findings: No erythema or rash.  ?Neurological:  ?   Mental Status: He is alert and oriented to person, place, and time.  ?   Cranial Nerves: No cranial nerve deficit.  ?   Motor: Weakness present.  ?   Deep Tendon Reflexes: Reflexes are normal and symmetric.  ?Psychiatric:     ?   Behavior: Behavior normal.     ?   Thought Content: Thought content normal.     ?   Judgment: Judgment normal.  ? ? ? ? ?BP 137/73   Pulse 79   Temp 97.6 ?F (36.4 ?C) (Oral)   Ht 5\' 6"  (1.676 m)   Wt 168 lb 3.2  oz (76.3 kg)   SpO2 (!) 89%   BMI 27.15 kg/m?  ? ?   ?Assessment & Plan:  ?John [name] comes in today with chief complaint of Fatigue and Chills (Staying chilly, want hgb checked) ? ? ?Diagnosis and orders addressed: ? ?1. Weakness ?- CBC with Differential/Platelet ?- Hemoglobin, fingerstick ? ?2. Other fatigue ?- CBC with Differential/Platelet ?- Hemoglobin, fingerstick ? ?3. COPD with acute exacerbation ([place]) ?Start doxycycline today today  ?Keep follow up in 2 weeks  ?- Take meds as prescribed ?- Use a cool mist humidifier  ?-Use saline nose sprays frequently ?-Force fluids ?-For any cough or congestion ? Use plain Mucinex- regular strength or max strength is fine ?-For fever or aces or pains- take tylenol or ibuprofen. ?-Throat lozenges if help ?-Follow up if symptoms worsen or do not improve  ?- CBC with Differential/Platelet ?- DG Chest 2 View ?- doxycycline  (VIBRA-TABS) 100 MG tablet; Take 1 tablet (100 mg total) by mouth 2 (two) times daily.  Dispense: 20 tablet; Refill: 0 ? ? ?Evelina Dun, FNP ? ?

## 2021-08-12 NOTE — Patient Instructions (Signed)
Chronic Obstructive Pulmonary Disease Exacerbation ? ?Chronic obstructive pulmonary disease (COPD) is a long-term (chronic) lung problem. In COPD, the flow of air from the lungs is limited. ?COPD exacerbations are times that breathing gets worse and you need more than your normal treatment. Without treatment, they can be life-threatening. If they happen often, your lungs can become more damaged. ?What are the causes? ?Having infections that affect your airways and lungs. ?Being exposed to: ?Smoke. ?Air pollution. ?Chemical fumes. ?Dust. ?Things that can cause an allergic reaction (allergens). ?Not taking your usual COPD medicines as told. ?Having medical problems already, such as heart failure or infections not involving the lungs. ?In many cases, the cause is not known. ?What increases the risk? ?Smoking. ?Being an older adult. ?Having frequent prior COPD exacerbations. ?What are the signs or symptoms? ?Increased coughing. ?Increased mucus from your lungs. ?Increased wheezing. ?Increased shortness of breath. ?Fast breathing and finding it hard to breathe. ?Chest tightness. ?Less energy than usual. ?Sleep disruption from symptoms. ?Confusion. ?Increased sleepiness. ?Often, these symptoms happen or get worse even with the use of medicines. ?How is this treated? ?Treatment for this condition depends on how bad it is and the cause of the symptoms. You may need to stay in the hospital for treatment. Treatment may include: ?Taking medicines. ?Using oxygen. ?Being treated with different ways to clear your airway, such as using a mask to deliver oxygen. ?Follow these instructions at home: ?Medicines ?Take over-the-counter and prescription medicines only as told by your doctor. ?Use all inhaled medicines the correct way. ?If you were prescribed an antibiotic or steroid medicine, take it as told by your doctor. Do not stop taking it even if you start to feel better. ?Lifestyle ?Do not smoke or use any products that contain  nicotine or tobacco. If you need help quitting, ask your doctor. ?Eat healthy foods. ?Exercise regularly. ?Get enough sleep. Most adults need 7 or more hours per night. ?Avoid tobacco smoke and other things that can bother your lungs. ?Several times a day, wash your hands with soap and water for at least 20 seconds. If you cannot use soap and water, use hand sanitizer. This may help keep you from getting an infection. ?During flu season, avoid areas that are crowded with people. ?General instructions ?Drink enough fluid to keep your pee (urine) pale yellow. Do not do this if your doctor has told you not to. ?Use a cool mist machine (vaporizer). ?If you use oxygen or a machine that turns medicine into a mist (nebulizer), continue to use it as told. ?Keep all follow-up visits. ?How is this prevented? ?Keep up with shots (vaccinations) as told by your doctor. Be sure to get a yearly flu (influenza) shot. ?If you smoke, quit smoking. Smoking makes the problem worse. ?Follow all instructions for rehabilitation. These are steps you can take to make your body work better. ?Work with your doctor to develop and follow an action plan. This tells you what steps to take when you experience certain symptoms. ?Contact a doctor if: ?Your COPD symptoms get worse than normal. ?Get help right away if: ?You are short of breath and it gets worse, even when you are resting. ?You have trouble talking. ?You have chest pain. ?You cough up blood. ?You have a fever. ?You keep vomiting. ?You feel weak or you pass out (faint). ?You feel confused. ?You are not able to sleep because of your symptoms. ?You have trouble doing daily activities. ?These symptoms may be an emergency. Get help right away.  Call your local emergency services (911 in the [place] ?Do not wait to see if the symptoms will go away. ?Do not drive yourself to the hospital. ?Summary ?COPD exacerbations are times that breathing gets worse and you need more treatment than  normal. ?COPD exacerbations can be very serious and may cause your lungs to become more damaged. ?Do not smoke. If you need help quitting, ask your doctor. ?Stay up to date on your shots. Get a flu shot every year. ?This information is not intended to replace advice given to you by your health care provider. Make sure you discuss any questions you have with your health care provider. ?Document Revised: 02/15/2020 Document Reviewed: 01/31/2020 ?Elsevier Patient Education ? [place]. ? ?

## 2021-08-13 ENCOUNTER — Ambulatory Visit: Payer: Self-pay | Admitting: *Deleted

## 2021-08-13 NOTE — Chronic Care Management (AMB) (Signed)
? ?  08/13/2021 ? ?John [name] ?April 03, 1946 ?[id number] ? ? ?Case closed to Chronic Care Management services in primary care home due to lack of patient engagement within the past 6 months. Re-enrollment in CCM services can be initiated in the future by PCP or patient as needed. ? ?Follow Up Plan: No further follow-up required at this time. Patient or PCP may request re-enrollment as necessary. Patient enrollment status has been updated to previously enrolled and RN Care Manager has been removed from the care plan.  ? ?[name], BSN, RN-BC ?Embedded Chronic Care Manager ?[facility] ?Direct Dial: [phone] ?

## 2021-08-14 ENCOUNTER — Encounter: Payer: Self-pay | Admitting: Nurse Practitioner

## 2021-08-14 ENCOUNTER — Telehealth: Payer: Self-pay

## 2021-08-14 ENCOUNTER — Ambulatory Visit (INDEPENDENT_AMBULATORY_CARE_PROVIDER_SITE_OTHER): Payer: Medicare Other | Admitting: Nurse Practitioner

## 2021-08-14 VITALS — BP 136/70 | HR 93 | Ht 66.0 in | Wt 168.0 lb

## 2021-08-14 DIAGNOSIS — E1142 Type 2 diabetes mellitus with diabetic polyneuropathy: Secondary | ICD-10-CM

## 2021-08-14 DIAGNOSIS — E782 Mixed hyperlipidemia: Secondary | ICD-10-CM

## 2021-08-14 DIAGNOSIS — E039 Hypothyroidism, unspecified: Secondary | ICD-10-CM | POA: Diagnosis not present

## 2021-08-14 LAB — POCT UA - MICROALBUMIN
Creatinine, POC: 100 mg/dL
Microalbumin Ur, POC: 30 mg/L

## 2021-08-14 NOTE — Progress Notes (Signed)
? ?                                                    Endocrinology Follow Up Note  ?     08/14/2021, 11:16 AM ? ? ?Subjective:  ? ? Patient ID: John [name], male    DOB: 1945/08/20.  ?John [name] is being seen in follow up after being seen in consultation for management of currently uncontrolled symptomatic diabetes requested by  [name], FNP. ? ? ?Past Medical History:  ?Diagnosis Date  ? Adenocarcinoma of lung, right ([place]) 04/18/2016  ? Anxiety   ? Arthritis   ? Asthma   ? BPH (benign prostatic hyperplasia)   ? with urinary retention 02/06/20  ? CHF (congestive heart failure) ([place])   ? COPD (chronic obstructive pulmonary disease) ([place])   ? Depression   ? Diabetes mellitus, type II ([place])   ? Dyspnea   ? History of kidney stones   ? History of radiation therapy   ? right lung 09/10/2020-09/17/2020   Dr Sondra Come  ? Hyperlipidemia   ? Hypertension   ? Hypothyroidism   ? Macular degeneration   ? Neuropathy   ? Non-small cell lung cancer, right ([place]) 04/18/2016  ? Peripheral vascular disease ([place])   ? Prostatitis   ? Pulmonary nodule, left 07/16/2016  ? Sleep apnea   ? cpap  ? ? ?Past Surgical History:  ?Procedure Laterality Date  ? ABDOMINAL AORTOGRAM W/LOWER EXTREMITY Left 02/06/2020  ? Procedure: ABDOMINAL AORTOGRAM W/LOWER EXTREMITY;  Surgeon: Lorretta Harp, MD;  Location: [facility];  Service: Cardiovascular;  Laterality: Left;  ? ABDOMINAL AORTOGRAM W/LOWER EXTREMITY N/A 05/30/2021  ? Procedure: ABDOMINAL AORTOGRAM W/LOWER EXTREMITY;  Surgeon: Marty Heck, MD;  Location: [facility];  Service: Cardiovascular;  Laterality: N/A;  ? CARDIOVERSION N/A 10/05/2020  ? Procedure: CARDIOVERSION;  Surgeon: Geralynn Rile, MD;  Location: [facility];  Service: Cardiovascular;  Laterality: N/A;  ? CATARACT EXTRACTION, BILATERAL Bilateral   ? COLONOSCOPY WITH PROPOFOL N/A 06/20/2021  ? Procedure: COLONOSCOPY WITH PROPOFOL;  Surgeon: Irene Shipper, MD;  Location: [facility];  Service: Endoscopy;  Laterality: N/A;  ? ENDARTERECTOMY FEMORAL Right 08/20/2020  ? Procedure: ENDARTERECTOMY  RIGHT FEMORAL ARTERY;  Surgeon: Marty Heck, MD;  Location: [facility];  Service: Vascular;  Laterality: Right;  ? ESOPHAGOGASTRODUODENOSCOPY (EGD) WITH PROPOFOL N/A 09/06/2020  ? Procedure: ESOPHAGOGASTRODUODENOSCOPY (EGD) WITH PROPOFOL;  Surgeon: Irene Shipper, MD;  Location: [facility];  Service: Endoscopy;  Laterality: N/A;  ? INSERTION OF ILIAC STENT Right 08/20/2020  ? Procedure: RETROGRADE INSERTION OF RIGHT ILIAC STENT;  Surgeon: Marty Heck, MD;  Location: [place];  Service: Vascular;  Laterality: Right;  ? INTRAOPERATIVE ARTERIOGRAM Right 08/20/2020  ? Procedure: INTRA OPERATIVE ARTERIOGRAM ILIAC;  Surgeon: Marty Heck, MD;  Location: [facility];  Service: Vascular;  Laterality: Right;  ? PATCH ANGIOPLASTY Right 08/20/2020  ? Procedure: PATCH ANGIOPLASTY RIGHT FEMORAL ARTERY;  Surgeon: Marty Heck, MD;  Location: [facility];  Service: Vascular;  Laterality: Right;  ? PERIPHERAL VASCULAR BALLOON ANGIOPLASTY Right 05/30/2021  ? Procedure: PERIPHERAL VASCULAR BALLOON ANGIOPLASTY;  Surgeon: Marty Heck, MD;  Location: [facility];  Service: Cardiovascular;  Laterality: Right;  ? PORTACATH PLACEMENT Left 06/13/2016  ? Procedure: INSERTION PORT-A-CATH;  Surgeon: Aviva Signs, MD;  Location: [facility];  Service: General;  Laterality: Left;  ? TRANSURETHRAL RESECTION OF PROSTATE N/A 05/31/2020  ? Procedure: TRANSURETHRAL RESECTION OF THE PROSTATE (TURP);  Surgeon: Cleon Gustin, MD;  Location: [facility];  Service: Urology;  Laterality: N/A;  ? VIDEO BRONCHOSCOPY WITH ENDOBRONCHIAL NAVIGATION N/A 05/28/2016  ? Procedure: VIDEO BRONCHOSCOPY WITH ENDOBRONCHIAL NAVIGATION;  Surgeon: [name], MD;  Location: [facility];  Service: Thoracic;  Laterality: N/A;  ? VIDEO BRONCHOSCOPY WITH ENDOBRONCHIAL ULTRASOUND N/A 05/28/2016  ? Procedure: VIDEO BRONCHOSCOPY WITH  ENDOBRONCHIAL ULTRASOUND;  Surgeon: [name], MD;  Location: [facility];  Service: Thoracic;  Laterality: N/A;  ? ? ?Social History  ? ?Socioeconomic History  ? Marital status: Soil scientist  ?  Spouse name: Not on file  ? Number of children: 5  ? Years of education: 10  ? Highest education level: 10th grade  ?Occupational History  ? Occupation: Retired  ?Tobacco Use  ? Smoking status: Every Day  ?  Packs/day: 0.50  ?  Years: 55.00  ?  Pack years: 27.50  ?  Types: Cigarettes  ?  Start date: 03/11/1961  ? Smokeless tobacco: Never  ? Tobacco comments:  ?  1/2 pack to 1 pack per day 12/14/19  ?Vaping Use  ? Vaping Use: Never used  ?Substance and Sexual Activity  ? Alcohol use: No  ? Drug use: No  ? Sexual activity: Yes  ?  Birth control/protection: None  ?Other Topics Concern  ? Not on file  ?Social History Narrative  ? [name] is his POA and lives with him. Children out of town - one in [facility], others in [place].  ? ?Social Determinants of Health  ? ?Financial Resource Strain: Low Risk   ? Difficulty of Paying Living Expenses: Not hard at all  ?Food Insecurity: No Food Insecurity  ? Worried About Charity fundraiser in the Last Year: Never true  ? Ran Out of Food in the Last Year: Never true  ?Transportation Needs: No Transportation Needs  ? Lack of Transportation (Medical): No  ? Lack of Transportation (Non-Medical): No  ?Physical Activity: Unknown  ? Days of Exercise per Week: 7 days  ? Minutes of Exercise per Session: Not on file  ?Stress: No Stress Concern Present  ? Feeling of Stress : Not at all  ?Social Connections: Moderately Isolated  ? Frequency of Communication with Friends and Family: More than three times a week  ? Frequency of Social Gatherings with Friends and Family: Once a week  ? Attends Religious Services: Never  ? Active Member of Clubs or Organizations: No  ? Attends Archivist Meetings: Never  ? Marital Status: Living with partner  ? ? ?Family History  ?Problem Relation Age  of Onset  ? Hypertension Mother   ? Diabetes Father   ? Heart disease Father   ? Stroke Father   ? Hypertension Sister   ? ? ?Outpatient Encounter Medications as of 08/14/2021  ?Medication Sig  ? albuterol (VENTOLIN HFA) 108 (90 Base) MCG/ACT inhaler Inhale 2 puffs into the lungs every 4 (four) hours as needed for wheezing or shortness of breath.  ? apixaban (ELIQUIS) 5 MG TABS tablet TAKE 1 TABLET BY MOUTH TWICE A DAY  ? aspirin EC 81 MG tablet Take 81 mg by mouth daily. Swallow whole.  ? Blood Glucose Monitoring Suppl (ONETOUCH VERIO REFLECT) w/Device KIT Use to test blood sugars daily as directed. DX: E11.9  ?  clopidogrel (PLAVIX) 75 MG tablet TAKE 1 TABLET BY MOUTH EVERY DAY  ? Cyanocobalamin (B-12 PO) Take 1 tablet by mouth daily.  ? doxycycline (VIBRA-TABS) 100 MG tablet Take 1 tablet (100 mg total) by mouth 2 (two) times daily.  ? ezetimibe (ZETIA) 10 MG tablet Take 1 tablet (10 mg total) by mouth daily.  ? fluticasone (FLONASE) 50 MCG/ACT nasal spray PLACE 1 SPRAY INTO BOTH NOSTRILS DAILY AS NEEDED FOR ALLERGIES OR RHINITIS.  ? Fluticasone-Umeclidin-Vilant (TRELEGY ELLIPTA) 100-62.5-25 MCG/INH AEPB Inhale 1 puff into the lungs daily.  ? gabapentin (NEURONTIN) 300 MG capsule TAKE 1 CAPSULE BY MOUTH THREE TIMES A DAY  ? hydrOXYzine (VISTARIL) 25 MG capsule Take 1 capsule (25 mg total) by mouth 3 (three) times daily as needed for itching.  ? Iron, Ferrous Sulfate, 325 (65 Fe) MG TABS Take 325 mg by mouth 2 (two) times daily.  ? KLOR-CON M20 20 MEQ tablet TAKE 1 TABLET BY MOUTH EVERY DAY  ? levocetirizine (XYZAL) 5 MG tablet Take 1 tablet (5 mg total) by mouth at bedtime. (Patient taking differently: Take 5 mg by mouth daily as needed for allergies.)  ? levothyroxine (SYNTHROID) 50 MCG tablet TAKE 1 TABLET BY MOUTH EVERY DAY  ? linaclotide (LINZESS) 145 MCG CAPS capsule Take 1 capsule (145 mcg total) by mouth daily. To regulate bowel movements (Patient taking differently: Take 145 mcg by mouth daily as needed  (constipation). To regulate bowel movements)  ? nebivolol (BYSTOLIC) 2.5 MG tablet TAKE 1 TABLET BY MOUTH EVERY DAY  ? ondansetron (ZOFRAN) 4 MG tablet Take 1 tablet (4 mg total) by mouth every 8 (eight) hour

## 2021-08-14 NOTE — Telephone Encounter (Signed)
Lab was not able to add on ferritin to labs, do you want pt to come back and get restuck? ?

## 2021-08-14 NOTE — Patient Instructions (Signed)
Diabetes Mellitus and Foot Care Foot care is an important part of your health, especially when you have diabetes. Diabetes may cause you to have problems because of poor blood flow (circulation) to your feet and legs, which can cause your skin to: Become thinner and drier. Break more easily. Heal more slowly. Peel and crack. You may also have nerve damage (neuropathy) in your legs and feet, causing decreased feeling in them. This means that you may not notice minor injuries to your feet that could lead to more serious problems. Noticing and addressing any potential problems early is the best way to prevent future foot problems. How to care for your feet Foot hygiene  Wash your feet daily with warm water and mild soap. Do not use hot water. Then, pat your feet and the areas between your toes until they are completely dry. Do not soak your feet as this can dry your skin. Trim your toenails straight across. Do not dig under them or around the cuticle. File the edges of your nails with an emery board or nail file. Apply a moisturizing lotion or petroleum jelly to the skin on your feet and to dry, brittle toenails. Use lotion that does not contain alcohol and is unscented. Do not apply lotion between your toes. Shoes and socks Wear clean socks or stockings every day. Make sure they are not too tight. Do not wear knee-high stockings since they may decrease blood flow to your legs. Wear shoes that fit properly and have enough cushioning. Always look in your shoes before you put them on to be sure there are no objects inside. To break in new shoes, wear them for just a few hours a day. This prevents injuries on your feet. Wounds, scrapes, corns, and calluses  Check your feet daily for blisters, cuts, bruises, sores, and redness. If you cannot see the bottom of your feet, use a mirror or ask someone for help. Do not cut corns or calluses or try to remove them with medicine. If you find a minor scrape,  cut, or break in the skin on your feet, keep it and the skin around it clean and dry. You may clean these areas with mild soap and water. Do not clean the area with peroxide, alcohol, or iodine. If you have a wound, scrape, corn, or callus on your foot, look at it several times a day to make sure it is healing and not infected. Check for: Redness, swelling, or pain. Fluid or blood. Warmth. Pus or a bad smell. General tips Do not cross your legs. This may decrease blood flow to your feet. Do not use heating pads or hot water bottles on your feet. They may burn your skin. If you have lost feeling in your feet or legs, you may not know this is happening until it is too late. Protect your feet from hot and cold by wearing shoes, such as at the beach or on hot pavement. Schedule a complete foot exam at least once a year (annually) or more often if you have foot problems. Report any cuts, sores, or bruises to your health care provider immediately. Where to find more information American Diabetes Association: www.diabetes.org Association [employer] Education [employer]ists: www.diabeteseducator.org Contact a health care provider if: You have a medical condition that increases your risk of infection and you have any cuts, sores, or bruises on your feet. You have an injury that is not healing. You have redness on your legs or feet. You   feel burning or tingling in your legs or feet. You have pain or cramps in your legs and feet. Your legs or feet are numb. Your feet always feel cold. You have pain around any toenails. Get help right away if: You have a wound, scrape, corn, or callus on your foot and: You have pain, swelling, or redness that gets worse. You have fluid or blood coming from the wound, scrape, corn, or callus. Your wound, scrape, corn, or callus feels warm to the touch. You have pus or a bad smell coming from the wound, scrape, corn, or callus. You have a fever. You have a red  line going up your leg. Summary Check your feet every day for blisters, cuts, bruises, sores, and redness. Apply a moisturizing lotion or petroleum jelly to the skin on your feet and to dry, brittle toenails. Wear shoes that fit properly and have enough cushioning. If you have foot problems, report any cuts, sores, or bruises to your health care provider immediately. Schedule a complete foot exam at least once a year (annually) or more often if you have foot problems. This information is not intended to replace advice given to you by your health care provider. Make sure you discuss any questions you have with your health care provider. Document Revised: 10/13/2019 Document Reviewed: 10/13/2019 Elsevier Patient Education  2023 [employer] Inc.  

## 2021-08-15 ENCOUNTER — Other Ambulatory Visit: Payer: Medicare Other

## 2021-08-15 DIAGNOSIS — Z1212 Encounter for screening for malignant neoplasm of rectum: Secondary | ICD-10-CM

## 2021-08-15 NOTE — Telephone Encounter (Signed)
We can do them on his next visit.  ? ?Evelina Dun, FNP ? ?

## 2021-08-16 LAB — FECAL OCCULT BLOOD, IMMUNOCHEMICAL: Fecal Occult Bld: POSITIVE — AB

## 2021-08-21 ENCOUNTER — Other Ambulatory Visit: Payer: Self-pay | Admitting: Family

## 2021-08-21 DIAGNOSIS — F5101 Primary insomnia: Secondary | ICD-10-CM

## 2021-08-22 ENCOUNTER — Ambulatory Visit: Payer: Medicare Other | Admitting: Family

## 2021-08-23 ENCOUNTER — Encounter: Payer: Self-pay | Admitting: Family

## 2021-08-23 ENCOUNTER — Ambulatory Visit (INDEPENDENT_AMBULATORY_CARE_PROVIDER_SITE_OTHER): Payer: Medicare Other | Admitting: Family

## 2021-08-23 VITALS — BP 131/66 | HR 90 | Temp 97.8°F | Ht 66.0 in | Wt 169.0 lb

## 2021-08-23 DIAGNOSIS — I5032 Chronic diastolic (congestive) heart failure: Secondary | ICD-10-CM | POA: Diagnosis not present

## 2021-08-23 DIAGNOSIS — Z720 Tobacco use: Secondary | ICD-10-CM

## 2021-08-23 DIAGNOSIS — K922 Gastrointestinal hemorrhage, unspecified: Secondary | ICD-10-CM

## 2021-08-23 DIAGNOSIS — E1169 Type 2 diabetes mellitus with other specified complication: Secondary | ICD-10-CM | POA: Diagnosis not present

## 2021-08-23 DIAGNOSIS — E1142 Type 2 diabetes mellitus with diabetic polyneuropathy: Secondary | ICD-10-CM

## 2021-08-23 DIAGNOSIS — I739 Peripheral vascular disease, unspecified: Secondary | ICD-10-CM | POA: Diagnosis not present

## 2021-08-23 DIAGNOSIS — E039 Hypothyroidism, unspecified: Secondary | ICD-10-CM

## 2021-08-23 DIAGNOSIS — E785 Hyperlipidemia, unspecified: Secondary | ICD-10-CM | POA: Diagnosis not present

## 2021-08-23 DIAGNOSIS — M159 Polyosteoarthritis, unspecified: Secondary | ICD-10-CM | POA: Diagnosis not present

## 2021-08-23 DIAGNOSIS — C3491 Malignant neoplasm of unspecified part of right bronchus or lung: Secondary | ICD-10-CM | POA: Diagnosis not present

## 2021-08-23 DIAGNOSIS — J441 Chronic obstructive pulmonary disease with (acute) exacerbation: Secondary | ICD-10-CM | POA: Diagnosis not present

## 2021-08-23 DIAGNOSIS — R300 Dysuria: Secondary | ICD-10-CM

## 2021-08-23 DIAGNOSIS — F17210 Nicotine dependence, cigarettes, uncomplicated: Secondary | ICD-10-CM

## 2021-08-23 DIAGNOSIS — I10 Essential (primary) hypertension: Secondary | ICD-10-CM

## 2021-08-23 LAB — MICROSCOPIC EXAMINATION
RBC, Urine: NONE SEEN /hpf (ref 0–2)
Renal Epithel, UA: NONE SEEN /hpf

## 2021-08-23 LAB — URINALYSIS, COMPLETE
Bilirubin, UA: NEGATIVE
Glucose, UA: NEGATIVE
Ketones, UA: NEGATIVE
Leukocytes,UA: NEGATIVE
Nitrite, UA: NEGATIVE
Protein,UA: NEGATIVE
RBC, UA: NEGATIVE
Specific Gravity, UA: 1.015 (ref 1.005–1.030)
Urobilinogen, Ur: 0.2 mg/dL (ref 0.2–1.0)
pH, UA: 5.5 (ref 5.0–7.5)

## 2021-08-23 LAB — BAYER DCA HB A1C WAIVED: HB A1C (BAYER DCA - WAIVED): 5.4 % (ref 4.8–5.6)

## 2021-08-23 NOTE — Progress Notes (Signed)
Subjective:    Patient ID: John [name], male    DOB: [birth year], 76 y.o.   MRN: [id number]  Chief Complaint  Patient presents with   Medical Management of Chronic Issues   Pt presents to the office today for  chronic care follow up. He is followed by Cardiologists  for CHF. Followed by Oncologists every 6 months for non-small lung cancer. He completed radiation.  He is followed by Pulmonologist every 6 months for COPD. He continues to smoke 1/2 and complaining of increased SOB.  He is followed by Vascular for PAD.    He is followed by Urologists for BPH and Prostate.  PT had a positive FOBT and has a follow up with GI 09/16/21. Hypertension This is a chronic problem. The current episode started more than 1 year ago. The problem has been resolved since onset. The problem is controlled. Associated symptoms include malaise/fatigue, peripheral edema and shortness of breath. Risk factors for coronary artery disease include dyslipidemia, diabetes mellitus, obesity, male gender, smoking/tobacco exposure and sedentary lifestyle. The current treatment provides moderate improvement. Identifiable causes of hypertension include a thyroid problem.  Congestive Heart Failure Presents for follow-up visit. Associated symptoms include edema, fatigue and shortness of breath. The symptoms have been stable.  Thyroid Problem Presents for follow-up visit. Symptoms include anxiety, fatigue and hoarse voice. Patient reports no constipation. The symptoms have been stable. His past medical history is significant for hyperlipidemia.  Hyperlipidemia This is a chronic problem. The current episode started more than 1 year ago. The problem is controlled. Recent lipid tests were reviewed and are normal. Exacerbating diseases include obesity. Associated symptoms include shortness of breath. Current antihyperlipidemic treatment includes statins. The current treatment provides moderate improvement of lipids. Risk factors for  coronary artery disease include dyslipidemia, diabetes mellitus, male sex, hypertension and a sedentary lifestyle.  Arthritis Presents for follow-up visit. He complains of pain and stiffness. The symptoms have been worsening. Affected locations include the left knee, right knee, left hip and right hip. Associated symptoms include fatigue.  Nicotine Dependence Presents for follow-up visit. Symptoms include fatigue. His urge triggers include company of smokers. He smokes < 1/2 a pack of cigarettes per day.  Anemia Presents for follow-up visit. Symptoms include malaise/fatigue. There has been no confusion or light-headedness.  Urinary Tract Infection  This is a recurrent problem. The current episode started in the past 7 days. The problem has been gradually worsening. The patient is experiencing no pain. Associated symptoms include urgency. Associated symptoms comments: Decreased urine and weakness. He has tried increased fluids for the symptoms. The treatment provided mild relief.     Review of Systems  Constitutional:  Positive for fatigue and malaise/fatigue.  HENT:  Positive for hoarse voice.   Respiratory:  Positive for shortness of breath.   Gastrointestinal:  Negative for constipation.  Genitourinary:  Positive for urgency.  Musculoskeletal:  Positive for arthritis and stiffness.  Neurological:  Negative for light-headedness.  Psychiatric/Behavioral:  Negative for confusion. The patient is nervous/anxious.   All other systems reviewed and are negative.     Objective:   Physical Exam Vitals reviewed.  Constitutional:      General: He is not in acute distress.    Appearance: He is well-developed. He is obese.  HENT:     Head: Normocephalic.     Right Ear: Tympanic membrane normal.     Left Ear: Tympanic membrane normal.  Eyes:     General:  Right eye: No discharge.        Left eye: No discharge.     Pupils: Pupils are equal, round, and reactive to light.  Neck:      Thyroid: No thyromegaly.  Cardiovascular:     Rate and Rhythm: Normal rate and regular rhythm.     Heart sounds: Normal heart sounds. No murmur heard. Pulmonary:     Effort: Pulmonary effort is normal. No respiratory distress.     Breath sounds: Normal breath sounds. No wheezing.  Abdominal:     General: Bowel sounds are normal. There is no distension.     Palpations: Abdomen is soft.     Tenderness: There is no abdominal tenderness.  Musculoskeletal:        General: No tenderness.     Cervical back: Normal range of motion and neck supple.  Skin:    General: Skin is warm and dry.     Findings: No erythema or rash.  Neurological:     Mental Status: He is alert and oriented to person, place, and time.     Cranial Nerves: No cranial nerve deficit.     Motor: Weakness (generalized weakness) present.     Deep Tendon Reflexes: Reflexes are normal and symmetric.  Psychiatric:        Behavior: Behavior normal.        Thought Content: Thought content normal.        Judgment: Judgment normal.         Blood pressure 131/66, pulse 90, temperature 97.8 F (36.6 C), height 5' 6"  (1.676 m), weight 169 lb (76.7 kg), SpO2 99 %.  Assessment & Plan:  HERCULES HASLER comes in today with chief complaint of Medical Management of Chronic Issues   Diagnosis and orders addressed:  1. Dysuria - Urinalysis, Complete - CMP14+EGFR - CBC with Differential/Platelet  2. Essential hypertension - CMP14+EGFR - CBC with Differential/Platelet  3. PAD (peripheral artery disease) (HCC)  - CMP14+EGFR - CBC with Differential/Platelet  4. Chronic diastolic CHF (congestive heart failure) (HCC) - CMP14+EGFR - CBC with Differential/Platelet  5. Non-small cell lung cancer, right (HCC) - CMP14+EGFR - CBC with Differential/Platelet  6. COPD with acute exacerbation (HCC) - CMP14+EGFR - CBC with Differential/Platelet  7. Gastrointestinal hemorrhage, unspecified gastrointestinal hemorrhage type -  CMP14+EGFR - CBC with Differential/Platelet  8. Hypothyroidism, unspecified type - CMP14+EGFR - CBC with Differential/Platelet - TSH  9. Hyperlipidemia associated with type 2 diabetes mellitus (HCC) - CMP14+EGFR - CBC with Differential/Platelet  10. Type 2 diabetes mellitus with diabetic polyneuropathy, without long-term current use of insulin (HCC) - CMP14+EGFR - CBC with Differential/Platelet - Bayer DCA Hb A1c Waived  11. Primary osteoarthritis involving multiple joints - CMP14+EGFR - CBC with Differential/Platelet  12. Tobacco abuse - CMP14+EGFR - CBC with Differential/Platelet  13. Cigarette smoker - CMP14+EGFR - CBC with Differential/Platelet   Labs pending Health Maintenance reviewed Diet and exercise encouraged  Follow up plan: 3 months    Evelina Dun, FNP

## 2021-08-23 NOTE — Patient Instructions (Signed)
Weakness Weakness is a lack of strength. You may feel weak all over your body (generalized), or you may feel weak in one part of your body (focal). Common causes of weakness include: Infection and disorders of the body's defense system (immune system). Physical exhaustion. Internal bleeding or other blood loss that results in a lack of red blood cells (anemia). Dehydration. An imbalance in mineral (electrolyte) levels, such as potassium. Chronic kidney or liver disease. Cancer. Other causes include: Some medicines or cancer treatment. Stress, anxiety, or depression. Heart disease, circulation problems, or stroke. Nervous system disorders. Thyroid disorders. Loss of muscle strength because of age or inactivity. Poor sleep quality or sleep disorders. The cause of your weakness may not be known. Some causes of weakness can be serious, so it is important to see your health care provider. Follow these instructions at home: Activity Rest as needed. Try to get enough sleep. Most adults need 7-8 hours of quality sleep each night. Talk to your health care provider about how much sleep you need. Do exercises, such as arm curls and leg raises, for 30 minutes at least 2 days a week or as told by your health care provider. This helps build muscle strength. Consider working with a physical therapist or trainer who can develop an exercise plan to help you gain muscle strength. General instructions  Take over-the-counter and prescription medicines only as told by your health care provider. Eat a healthy, well-balanced diet. This includes: Proteins to build muscles, such as lean meats and fish. Fresh fruits and vegetables. Carbohydrates to boost energy, such as whole grains. Drink enough fluid to keep your urine pale yellow. Keep all follow-up visits. This is important. Contact a health care provider if: Your weakness does not improve or gets worse. Your weakness affects your ability to think  clearly. Your weakness affects your ability to do your normal daily activities. Get help right away if: You develop sudden weakness, especially on one side of your face or body. You have chest pain. You have trouble breathing or shortness of breath. You have problems with your vision. You have trouble talking or swallowing. You have trouble standing or walking. You are light-headed or lose consciousness. These symptoms may be an emergency. Get help right away. Call 911. Do not wait to see if the symptoms will go away. Do not drive yourself to the hospital. Summary Weakness is a lack of strength. You may feel weak all over your body or just in one specific part of your body. Weakness can be caused by a variety of things. In some cases, the cause may be unknown. Rest as needed, and try to get enough sleep. Most adults need 7-8 hours of quality sleep each night. Eat a healthy, well-balanced diet. This information is not intended to replace advice given to you by your health care provider. Make sure you discuss any questions you have with your health care provider. Document Revised: 02/24/2021 Document Reviewed: 02/24/2021 Elsevier Patient Education  2023 [employer].  

## 2021-08-24 LAB — CBC WITH DIFFERENTIAL/PLATELET
Basophils Absolute: 0.1 10*3/uL (ref 0.0–0.2)
Basos: 1 %
EOS (ABSOLUTE): 0.1 10*3/uL (ref 0.0–0.4)
Eos: 1 %
Hematocrit: 32.4 % — ABNORMAL LOW (ref 37.5–51.0)
Hemoglobin: 10.4 g/dL — ABNORMAL LOW (ref 13.0–17.7)
Immature Grans (Abs): 0 10*3/uL (ref 0.0–0.1)
Immature Granulocytes: 0 %
Lymphocytes Absolute: 0.5 10*3/uL — ABNORMAL LOW (ref 0.7–3.1)
Lymphs: 6 %
MCH: 34.6 pg — ABNORMAL HIGH (ref 26.6–33.0)
MCHC: 32.1 g/dL (ref 31.5–35.7)
MCV: 108 fL — ABNORMAL HIGH (ref 79–97)
Monocytes Absolute: 0.9 10*3/uL (ref 0.1–0.9)
Monocytes: 11 %
Neutrophils Absolute: 6.7 10*3/uL (ref 1.4–7.0)
Neutrophils: 81 %
Platelets: 302 10*3/uL (ref 150–450)
RBC: 3.01 x10E6/uL — ABNORMAL LOW (ref 4.14–5.80)
RDW: 12.6 % (ref 11.6–15.4)
WBC: 8.2 10*3/uL (ref 3.4–10.8)

## 2021-08-24 LAB — CMP14+EGFR
ALT: 11 IU/L (ref 0–44)
AST: 16 IU/L (ref 0–40)
Albumin/Globulin Ratio: 1.6 (ref 1.2–2.2)
Albumin: 4.2 g/dL (ref 3.7–4.7)
Alkaline Phosphatase: 123 IU/L — ABNORMAL HIGH (ref 44–121)
BUN/Creatinine Ratio: 18 (ref 10–24)
BUN: 24 mg/dL (ref 8–27)
Bilirubin Total: 0.3 mg/dL (ref 0.0–1.2)
CO2: 22 mmol/L (ref 20–29)
Calcium: 9.4 mg/dL (ref 8.6–10.2)
Chloride: 98 mmol/L (ref 96–106)
Creatinine, Ser: 1.31 mg/dL — ABNORMAL HIGH (ref 0.76–1.27)
Globulin, Total: 2.6 g/dL (ref 1.5–4.5)
Glucose: 161 mg/dL — ABNORMAL HIGH (ref 70–99)
Potassium: 4.6 mmol/L (ref 3.5–5.2)
Sodium: 140 mmol/L (ref 134–144)
Total Protein: 6.8 g/dL (ref 6.0–8.5)
eGFR: 57 mL/min/{1.73_m2} — ABNORMAL LOW (ref >59)

## 2021-08-24 LAB — TSH: TSH: 2.51 u[IU]/mL (ref 0.450–4.500)

## 2021-08-25 ENCOUNTER — Encounter (HOSPITAL_COMMUNITY): Payer: Self-pay | Admitting: Emergency Medicine

## 2021-08-25 ENCOUNTER — Inpatient Hospital Stay (HOSPITAL_COMMUNITY)
Admission: EM | Admit: 2021-08-25 | Discharge: 2021-08-27 | DRG: 291 | Disposition: A | Discharge status: Home / Self Care | Payer: Medicare Other | Attending: Internal Medicine | Admitting: Internal Medicine

## 2021-08-25 ENCOUNTER — Emergency Department (HOSPITAL_COMMUNITY): Payer: Medicare Other

## 2021-08-25 ENCOUNTER — Other Ambulatory Visit: Payer: Self-pay

## 2021-08-25 DIAGNOSIS — Z7901 Long term (current) use of anticoagulants: Secondary | ICD-10-CM | POA: Diagnosis not present

## 2021-08-25 DIAGNOSIS — G4733 Obstructive sleep apnea (adult) (pediatric): Secondary | ICD-10-CM | POA: Diagnosis present

## 2021-08-25 DIAGNOSIS — I480 Paroxysmal atrial fibrillation: Secondary | ICD-10-CM | POA: Diagnosis not present

## 2021-08-25 DIAGNOSIS — I2510 Atherosclerotic heart disease of native coronary artery without angina pectoris: Secondary | ICD-10-CM | POA: Diagnosis not present

## 2021-08-25 DIAGNOSIS — K219 Gastro-esophageal reflux disease without esophagitis: Secondary | ICD-10-CM | POA: Diagnosis present

## 2021-08-25 DIAGNOSIS — Z79899 Other long term (current) drug therapy: Secondary | ICD-10-CM

## 2021-08-25 DIAGNOSIS — J9611 Chronic respiratory failure with hypoxia: Secondary | ICD-10-CM | POA: Diagnosis present

## 2021-08-25 DIAGNOSIS — Z9221 Personal history of antineoplastic chemotherapy: Secondary | ICD-10-CM | POA: Diagnosis not present

## 2021-08-25 DIAGNOSIS — Z923 Personal history of irradiation: Secondary | ICD-10-CM | POA: Diagnosis not present

## 2021-08-25 DIAGNOSIS — R0602 Shortness of breath: Secondary | ICD-10-CM | POA: Diagnosis not present

## 2021-08-25 DIAGNOSIS — Z9181 History of falling: Secondary | ICD-10-CM

## 2021-08-25 DIAGNOSIS — N400 Enlarged prostate without lower urinary tract symptoms: Secondary | ICD-10-CM | POA: Diagnosis present

## 2021-08-25 DIAGNOSIS — Z823 Family history of stroke: Secondary | ICD-10-CM

## 2021-08-25 DIAGNOSIS — F32A Depression, unspecified: Secondary | ICD-10-CM | POA: Diagnosis present

## 2021-08-25 DIAGNOSIS — E1151 Type 2 diabetes mellitus with diabetic peripheral angiopathy without gangrene: Secondary | ICD-10-CM | POA: Diagnosis present

## 2021-08-25 DIAGNOSIS — I110 Hypertensive heart disease with heart failure: Principal | ICD-10-CM | POA: Diagnosis present

## 2021-08-25 DIAGNOSIS — R296 Repeated falls: Secondary | ICD-10-CM | POA: Diagnosis present

## 2021-08-25 DIAGNOSIS — S0990XA Unspecified injury of head, initial encounter: Secondary | ICD-10-CM | POA: Diagnosis not present

## 2021-08-25 DIAGNOSIS — E1165 Type 2 diabetes mellitus with hyperglycemia: Secondary | ICD-10-CM | POA: Diagnosis not present

## 2021-08-25 DIAGNOSIS — C3491 Malignant neoplasm of unspecified part of right bronchus or lung: Secondary | ICD-10-CM | POA: Diagnosis present

## 2021-08-25 DIAGNOSIS — Z9981 Dependence on supplemental oxygen: Secondary | ICD-10-CM | POA: Diagnosis not present

## 2021-08-25 DIAGNOSIS — Z882 Allergy status to sulfonamides status: Secondary | ICD-10-CM | POA: Diagnosis not present

## 2021-08-25 DIAGNOSIS — R609 Edema, unspecified: Secondary | ICD-10-CM | POA: Diagnosis not present

## 2021-08-25 DIAGNOSIS — R051 Acute cough: Secondary | ICD-10-CM | POA: Diagnosis not present

## 2021-08-25 DIAGNOSIS — Z888 Allergy status to other drugs, medicaments and biological substances status: Secondary | ICD-10-CM | POA: Diagnosis not present

## 2021-08-25 DIAGNOSIS — Z85118 Personal history of other malignant neoplasm of bronchus and lung: Secondary | ICD-10-CM

## 2021-08-25 DIAGNOSIS — I6782 Cerebral ischemia: Secondary | ICD-10-CM | POA: Diagnosis not present

## 2021-08-25 DIAGNOSIS — R7989 Other specified abnormal findings of blood chemistry: Secondary | ICD-10-CM | POA: Diagnosis present

## 2021-08-25 DIAGNOSIS — I5033 Acute on chronic diastolic (congestive) heart failure: Secondary | ICD-10-CM | POA: Diagnosis present

## 2021-08-25 DIAGNOSIS — R55 Syncope and collapse: Secondary | ICD-10-CM | POA: Diagnosis not present

## 2021-08-25 DIAGNOSIS — F17210 Nicotine dependence, cigarettes, uncomplicated: Secondary | ICD-10-CM | POA: Diagnosis not present

## 2021-08-25 DIAGNOSIS — F419 Anxiety disorder, unspecified: Secondary | ICD-10-CM | POA: Diagnosis present

## 2021-08-25 DIAGNOSIS — E119 Type 2 diabetes mellitus without complications: Secondary | ICD-10-CM | POA: Diagnosis present

## 2021-08-25 DIAGNOSIS — W19XXXA Unspecified fall, initial encounter: Secondary | ICD-10-CM | POA: Diagnosis not present

## 2021-08-25 DIAGNOSIS — Z79890 Hormone replacement therapy (postmenopausal): Secondary | ICD-10-CM

## 2021-08-25 DIAGNOSIS — Z7902 Long term (current) use of antithrombotics/antiplatelets: Secondary | ICD-10-CM

## 2021-08-25 DIAGNOSIS — I6521 Occlusion and stenosis of right carotid artery: Secondary | ICD-10-CM | POA: Diagnosis present

## 2021-08-25 DIAGNOSIS — Z20822 Contact with and (suspected) exposure to covid-19: Secondary | ICD-10-CM | POA: Diagnosis present

## 2021-08-25 DIAGNOSIS — I4892 Unspecified atrial flutter: Secondary | ICD-10-CM | POA: Diagnosis present

## 2021-08-25 DIAGNOSIS — J441 Chronic obstructive pulmonary disease with (acute) exacerbation: Secondary | ICD-10-CM | POA: Diagnosis present

## 2021-08-25 DIAGNOSIS — M50323 Other cervical disc degeneration at C6-C7 level: Secondary | ICD-10-CM | POA: Diagnosis not present

## 2021-08-25 DIAGNOSIS — E039 Hypothyroidism, unspecified: Secondary | ICD-10-CM | POA: Diagnosis present

## 2021-08-25 DIAGNOSIS — E1169 Type 2 diabetes mellitus with other specified complication: Secondary | ICD-10-CM | POA: Diagnosis not present

## 2021-08-25 DIAGNOSIS — R778 Other specified abnormalities of plasma proteins: Secondary | ICD-10-CM | POA: Diagnosis not present

## 2021-08-25 DIAGNOSIS — E782 Mixed hyperlipidemia: Secondary | ICD-10-CM | POA: Diagnosis not present

## 2021-08-25 DIAGNOSIS — I5032 Chronic diastolic (congestive) heart failure: Secondary | ICD-10-CM | POA: Diagnosis present

## 2021-08-25 DIAGNOSIS — R0689 Other abnormalities of breathing: Secondary | ICD-10-CM | POA: Diagnosis not present

## 2021-08-25 DIAGNOSIS — M50322 Other cervical disc degeneration at C5-C6 level: Secondary | ICD-10-CM | POA: Diagnosis not present

## 2021-08-25 DIAGNOSIS — I4891 Unspecified atrial fibrillation: Secondary | ICD-10-CM | POA: Diagnosis not present

## 2021-08-25 DIAGNOSIS — I639 Cerebral infarction, unspecified: Secondary | ICD-10-CM | POA: Diagnosis not present

## 2021-08-25 DIAGNOSIS — S199XXA Unspecified injury of neck, initial encounter: Secondary | ICD-10-CM | POA: Diagnosis not present

## 2021-08-25 DIAGNOSIS — Z8249 Family history of ischemic heart disease and other diseases of the circulatory system: Secondary | ICD-10-CM

## 2021-08-25 DIAGNOSIS — Z833 Family history of diabetes mellitus: Secondary | ICD-10-CM

## 2021-08-25 LAB — CBC WITH DIFFERENTIAL/PLATELET
Abs Immature Granulocytes: 0.03 10*3/uL (ref 0.00–0.07)
Basophils Absolute: 0 10*3/uL (ref 0.0–0.1)
Basophils Relative: 1 %
Eosinophils Absolute: 0.2 10*3/uL (ref 0.0–0.5)
Eosinophils Relative: 2 %
HCT: 33.3 % — ABNORMAL LOW (ref 39.0–52.0)
Hemoglobin: 10.1 g/dL — ABNORMAL LOW (ref 13.0–17.0)
Immature Granulocytes: 0 %
Lymphocytes Relative: 7 %
Lymphs Abs: 0.5 10*3/uL — ABNORMAL LOW (ref 0.7–4.0)
MCH: 34.9 pg — ABNORMAL HIGH (ref 26.0–34.0)
MCHC: 30.3 g/dL (ref 30.0–36.0)
MCV: 115.2 fL — ABNORMAL HIGH (ref 80.0–100.0)
Monocytes Absolute: 1 10*3/uL (ref 0.1–1.0)
Monocytes Relative: 14 %
Neutro Abs: 5.7 10*3/uL (ref 1.7–7.7)
Neutrophils Relative %: 76 %
Platelets: 277 10*3/uL (ref 150–400)
RBC: 2.89 MIL/uL — ABNORMAL LOW (ref 4.22–5.81)
RDW: 15 % (ref 11.5–15.5)
WBC: 7.4 10*3/uL (ref 4.0–10.5)
nRBC: 0.3 % — ABNORMAL HIGH (ref 0.0–0.2)

## 2021-08-25 LAB — BLOOD GAS, VENOUS
Acid-Base Excess: 5.8 mmol/L — ABNORMAL HIGH (ref 0.0–2.0)
Bicarbonate: 32.5 mmol/L — ABNORMAL HIGH (ref 20.0–28.0)
O2 Saturation: 86.9 %
Patient temperature: 36.2
pCO2, Ven: 53 mmHg (ref 44–60)
pH, Ven: 7.39 (ref 7.25–7.43)
pO2, Ven: 53 mmHg — ABNORMAL HIGH (ref 32–45)

## 2021-08-25 LAB — COMPREHENSIVE METABOLIC PANEL
ALT: 12 U/L (ref 0–44)
AST: 13 U/L — ABNORMAL LOW (ref 15–41)
Albumin: 3.2 g/dL — ABNORMAL LOW (ref 3.5–5.0)
Alkaline Phosphatase: 89 U/L (ref 38–126)
Anion gap: 10 (ref 5–15)
BUN: 17 mg/dL (ref 8–23)
CO2: 26 mmol/L (ref 22–32)
Calcium: 8.6 mg/dL — ABNORMAL LOW (ref 8.9–10.3)
Chloride: 101 mmol/L (ref 98–111)
Creatinine, Ser: 1.09 mg/dL (ref 0.61–1.24)
GFR, Estimated: >60 mL/min (ref >60)
Glucose, Bld: 175 mg/dL — ABNORMAL HIGH (ref 70–99)
Potassium: 3.6 mmol/L (ref 3.5–5.1)
Sodium: 137 mmol/L (ref 135–145)
Total Bilirubin: 0.8 mg/dL (ref 0.3–1.2)
Total Protein: 6.4 g/dL — ABNORMAL LOW (ref 6.5–8.1)

## 2021-08-25 LAB — BRAIN NATRIURETIC PEPTIDE: B Natriuretic Peptide: 407.4 pg/mL — ABNORMAL HIGH (ref 0.0–100.0)

## 2021-08-25 LAB — TROPONIN I (HIGH SENSITIVITY): Troponin I (High Sensitivity): 26 ng/L — ABNORMAL HIGH (ref <18)

## 2021-08-25 LAB — BASIC METABOLIC PANEL
Anion gap: 8 (ref 5–15)
BUN: 19 mg/dL (ref 8–23)
CO2: 29 mmol/L (ref 22–32)
Calcium: 8.8 mg/dL — ABNORMAL LOW (ref 8.9–10.3)
Chloride: 102 mmol/L (ref 98–111)
Creatinine, Ser: 1.2 mg/dL (ref 0.61–1.24)
GFR, Estimated: >60 mL/min (ref >60)
Glucose, Bld: 127 mg/dL — ABNORMAL HIGH (ref 70–99)
Potassium: 3.7 mmol/L (ref 3.5–5.1)
Sodium: 139 mmol/L (ref 135–145)

## 2021-08-25 LAB — GLUCOSE, CAPILLARY: Glucose-Capillary: 155 mg/dL — ABNORMAL HIGH (ref 70–99)

## 2021-08-25 LAB — MAGNESIUM: Magnesium: 1.9 mg/dL (ref 1.7–2.4)

## 2021-08-25 MED ORDER — PANTOPRAZOLE SODIUM 40 MG PO TBEC
40.0000 mg | DELAYED_RELEASE_TABLET | Freq: Every day | ORAL | Status: DC
  Administered 2021-08-26 – 2021-08-27 (×2): 40 mg via ORAL
  Filled 2021-08-25 (×2): qty 1

## 2021-08-25 MED ORDER — INSULIN ASPART 100 UNIT/ML IJ SOLN
0.0000 [IU] | Freq: Three times a day (TID) | INTRAMUSCULAR | Status: DC
  Administered 2021-08-25: 3 [IU] via SUBCUTANEOUS
  Administered 2021-08-26 (×2): 5 [IU] via SUBCUTANEOUS
  Administered 2021-08-26 (×2): 8 [IU] via SUBCUTANEOUS
  Administered 2021-08-27: 3 [IU] via SUBCUTANEOUS
  Administered 2021-08-27: 8 [IU] via SUBCUTANEOUS

## 2021-08-25 MED ORDER — GABAPENTIN 300 MG PO CAPS
300.0000 mg | ORAL_CAPSULE | Freq: Three times a day (TID) | ORAL | Status: DC
  Administered 2021-08-25 – 2021-08-27 (×5): 300 mg via ORAL
  Filled 2021-08-25 (×5): qty 1

## 2021-08-25 MED ORDER — TORSEMIDE 20 MG PO TABS: 40.0000 mg | ORAL_TABLET | Freq: Every day | ORAL | Status: DC

## 2021-08-25 MED ORDER — ENSURE ENLIVE PO LIQD
237.0000 mL | Freq: Two times a day (BID) | ORAL | Status: DC
  Administered 2021-08-26 (×2): 237 mL via ORAL

## 2021-08-25 MED ORDER — FLUTICASONE PROPIONATE 50 MCG/ACT NA SUSP: 1.0000 | Freq: Every day | NASAL | Status: DC | PRN

## 2021-08-25 MED ORDER — POLYETHYLENE GLYCOL 3350 17 G PO PACK: 17.0000 g | PACK | Freq: Every day | ORAL | Status: DC | PRN

## 2021-08-25 MED ORDER — APIXABAN 5 MG PO TABS
5.0000 mg | ORAL_TABLET | Freq: Two times a day (BID) | ORAL | Status: DC
  Administered 2021-08-25 – 2021-08-27 (×4): 5 mg via ORAL
  Filled 2021-08-25 (×4): qty 1

## 2021-08-25 MED ORDER — NICOTINE 21 MG/24HR TD PT24
21.0000 mg | MEDICATED_PATCH | Freq: Every day | TRANSDERMAL | Status: DC
  Administered 2021-08-26 – 2021-08-27 (×2): 21 mg via TRANSDERMAL
  Filled 2021-08-25 (×2): qty 1

## 2021-08-25 MED ORDER — LINACLOTIDE 145 MCG PO CAPS: 145.0000 ug | ORAL_CAPSULE | Freq: Every day | ORAL | Status: DC | PRN

## 2021-08-25 MED ORDER — ASPIRIN 81 MG PO TBEC
81.0000 mg | DELAYED_RELEASE_TABLET | Freq: Every day | ORAL | Status: DC
  Administered 2021-08-26 – 2021-08-27 (×2): 81 mg via ORAL
  Filled 2021-08-25 (×2): qty 1

## 2021-08-25 MED ORDER — MELATONIN 5 MG PO TABS
10.0000 mg | ORAL_TABLET | Freq: Every evening | ORAL | Status: DC | PRN
  Administered 2021-08-25 – 2021-08-26 (×2): 10 mg via ORAL
  Filled 2021-08-25 (×2): qty 2

## 2021-08-25 MED ORDER — LEVOTHYROXINE SODIUM 25 MCG PO TABS
50.0000 ug | ORAL_TABLET | Freq: Every day | ORAL | Status: DC
  Administered 2021-08-26 – 2021-08-27 (×2): 50 ug via ORAL
  Filled 2021-08-25 (×2): qty 2

## 2021-08-25 MED ORDER — IPRATROPIUM-ALBUTEROL 0.5-2.5 (3) MG/3ML IN SOLN
3.0000 mL | Freq: Four times a day (QID) | RESPIRATORY_TRACT | Status: DC
  Administered 2021-08-25 – 2021-08-26 (×3): 3 mL via RESPIRATORY_TRACT
  Filled 2021-08-25 (×3): qty 3

## 2021-08-25 MED ORDER — HYDROXYZINE PAMOATE 25 MG PO CAPS: 25.0000 mg | ORAL_CAPSULE | Freq: Three times a day (TID) | ORAL | Status: DC | PRN

## 2021-08-25 MED ORDER — HYDROXYZINE HCL 25 MG PO TABS: 25.0000 mg | ORAL_TABLET | Freq: Three times a day (TID) | ORAL | Status: DC | PRN

## 2021-08-25 MED ORDER — IPRATROPIUM-ALBUTEROL 0.5-2.5 (3) MG/3ML IN SOLN
3.0000 mL | Freq: Once | RESPIRATORY_TRACT | Status: AC
Start: 2021-08-25 — End: 2021-08-25
  Administered 2021-08-25: 3 mL via RESPIRATORY_TRACT
  Filled 2021-08-25: qty 3

## 2021-08-25 MED ORDER — SODIUM CHLORIDE 0.9% FLUSH
3.0000 mL | Freq: Two times a day (BID) | INTRAVENOUS | Status: DC
  Administered 2021-08-25 – 2021-08-27 (×4): 3 mL via INTRAVENOUS

## 2021-08-25 MED ORDER — NEBIVOLOL HCL 2.5 MG PO TABS
2.5000 mg | ORAL_TABLET | Freq: Every day | ORAL | Status: DC
Start: 2021-08-26 — End: 2021-08-27
  Administered 2021-08-26 – 2021-08-27 (×2): 2.5 mg via ORAL
  Filled 2021-08-25 (×2): qty 1

## 2021-08-25 MED ORDER — FERROUS SULFATE 325 (65 FE) MG PO TABS
325.0000 mg | ORAL_TABLET | Freq: Two times a day (BID) | ORAL | Status: DC
  Administered 2021-08-25 – 2021-08-27 (×4): 325 mg via ORAL
  Filled 2021-08-25 (×4): qty 1

## 2021-08-25 MED ORDER — PRAVASTATIN SODIUM 40 MG PO TABS
40.0000 mg | ORAL_TABLET | ORAL | Status: DC
  Administered 2021-08-26: 40 mg via ORAL
  Filled 2021-08-25: qty 1

## 2021-08-25 MED ORDER — ACETAMINOPHEN 325 MG PO TABS
650.0000 mg | ORAL_TABLET | Freq: Four times a day (QID) | ORAL | Status: DC | PRN
Start: 2021-08-25 — End: 2021-08-27

## 2021-08-25 MED ORDER — FUROSEMIDE 10 MG/ML IJ SOLN
40.0000 mg | Freq: Once | INTRAMUSCULAR | Status: AC
  Administered 2021-08-25: 40 mg via INTRAVENOUS
  Filled 2021-08-25: qty 4

## 2021-08-25 MED ORDER — ACETAMINOPHEN 650 MG RE SUPP: 650.0000 mg | Freq: Four times a day (QID) | RECTAL | Status: DC | PRN

## 2021-08-25 MED ORDER — ONDANSETRON HCL 4 MG PO TABS: 4.0000 mg | ORAL_TABLET | Freq: Four times a day (QID) | ORAL | Status: DC | PRN

## 2021-08-25 MED ORDER — CLOPIDOGREL BISULFATE 75 MG PO TABS
75.0000 mg | ORAL_TABLET | Freq: Every day | ORAL | Status: DC
  Administered 2021-08-26: 75 mg via ORAL
  Filled 2021-08-25 (×2): qty 1

## 2021-08-25 MED ORDER — METHYLPREDNISOLONE SODIUM SUCC 125 MG IJ SOLR
125.0000 mg | INTRAMUSCULAR | Status: AC
  Administered 2021-08-25: 125 mg via INTRAVENOUS
  Filled 2021-08-25: qty 2

## 2021-08-25 MED ORDER — ONDANSETRON HCL 4 MG/2ML IJ SOLN
4.0000 mg | Freq: Four times a day (QID) | INTRAMUSCULAR | Status: DC | PRN
Start: 2021-08-25 — End: 2021-08-27

## 2021-08-25 MED ORDER — IPRATROPIUM-ALBUTEROL 0.5-2.5 (3) MG/3ML IN SOLN: 3.0000 mL | RESPIRATORY_TRACT | Status: DC | PRN

## 2021-08-25 MED ORDER — METHYLPREDNISOLONE SODIUM SUCC 40 MG IJ SOLR
40.0000 mg | Freq: Two times a day (BID) | INTRAMUSCULAR | Status: AC
  Administered 2021-08-26 (×2): 40 mg via INTRAVENOUS
  Filled 2021-08-25 (×2): qty 1

## 2021-08-25 MED ORDER — TRAZODONE HCL 50 MG PO TABS
50.0000 mg | ORAL_TABLET | Freq: Every evening | ORAL | Status: DC | PRN
  Filled 2021-08-25: qty 2

## 2021-08-25 NOTE — Assessment & Plan Note (Addendum)
SSI

## 2021-08-25 NOTE — ED Provider Notes (Signed)
[name] Provider Note   CSN: [id number] Arrival date & time: 08/25/21  1638     History  Chief Complaint  Patient presents with   Shortness of Breath    [name] is a 76 y.o. male with medical history of non-small cell lung cancer, CHF, COPD, A-fib on Eliquis, diabetes, hypertension, BPH.  Patient presents ED for evaluation of shortness of breath.  Patient reports that he has been feeling short of breath since Thursday.  Patient states that his shortness of breath is caused him to fall twice at home, denies hitting his head or losing consciousness.  Patient states that since Friday his shortness of breath is progressively worsened.  Patient reports he has a history of COPD, has oxygen at home he is supposed to use as needed.  Patient reports that before this last Thursday or Friday he had not used his oxygen in "3 to 4 months" but since Friday he has used his oxygen every day.  Patient also endorsing CHF, states that he takes 20 mg of Lasix in the morning and 20 mg of Lasix in the afternoon.  Patient reports compliance on this medication regimen.  Patient states that his shortness of breath has gotten so severe in the last couple days that today he took 40 mg of Lasix in the morning with no improvement. Patient states that his shortness of breath got so severe today that it caused him to seek care at urgent care.  At urgent care the patient was noted to be hypoxic with rales on lungs.  Urgent care provider sent patient here for further evaluation via EMS.  Patient is endorsing shortness of breath, distended abdomen, ankle swelling.  Patient denies any fevers, nausea, vomiting, chest pain, lightheadedness, dizziness, weakness, neck pain, headache.   Shortness of Breath Associated symptoms: no chest pain, no fever, no headaches, no neck pain and no vomiting       Home Medications Prior to Admission medications   Medication Sig Start Date End  Date Taking? Authorizing Provider  albuterol (VENTOLIN HFA) 108 (90 Base) MCG/ACT inhaler Inhale 2 puffs into the lungs every 4 (four) hours as needed for wheezing or shortness of breath. 03/05/21   [name], FNP  apixaban (ELIQUIS) 5 MG TABS tablet TAKE 1 TABLET BY MOUTH TWICE A DAY 04/16/21   O'Neal, Cassie Freer, MD  aspirin EC 81 MG tablet Take 81 mg by mouth daily. Swallow whole.    [provider]  Blood Glucose Monitoring Suppl (ONETOUCH VERIO REFLECT) w/Device KIT Use to test blood sugars daily as directed. DX: E11.9 10/14/19   Evelina Dun A, FNP  clopidogrel (PLAVIX) 75 MG tablet TAKE 1 TABLET BY MOUTH EVERY DAY 07/16/21   O'Neal, Cassie Freer, MD  Cyanocobalamin (B-12 PO) Take 1 tablet by mouth daily.    [provider]  doxycycline (VIBRA-TABS) 100 MG tablet Take 1 tablet (100 mg total) by mouth 2 (two) times daily. Patient not taking: Reported on 08/23/2021 08/12/21   Evelina Dun A, FNP  ezetimibe (ZETIA) 10 MG tablet Take 1 tablet (10 mg total) by mouth daily. 02/26/21 06/17/21  Geralynn Rile, MD  fluticasone (FLONASE) 50 MCG/ACT nasal spray PLACE 1 SPRAY INTO BOTH NOSTRILS DAILY AS NEEDED FOR ALLERGIES OR RHINITIS. 04/17/21   Evelina Dun A, FNP  Fluticasone-Umeclidin-Vilant (TRELEGY ELLIPTA) 100-62.5-25 MCG/INH AEPB Inhale 1 puff into the lungs daily. 11/08/20   Tanda Rockers, MD  gabapentin (NEURONTIN) 300 MG capsule  TAKE 1 CAPSULE BY MOUTH THREE TIMES A DAY 07/16/21   Evelina Dun A, FNP  hydrOXYzine (VISTARIL) 25 MG capsule Take 1 capsule (25 mg total) by mouth 3 (three) times daily as needed for itching. 11/01/20   [name], FNP  Iron, Ferrous Sulfate, 325 (65 Fe) MG TABS Take 325 mg by mouth 2 (two) times daily. 04/23/21   [name], FNP  KLOR-CON M20 20 MEQ tablet TAKE 1 TABLET BY MOUTH EVERY DAY 07/16/21   Evelina Dun A, FNP  levocetirizine (XYZAL) 5 MG tablet Take 1 tablet (5 mg total) by mouth at bedtime. Patient taking  differently: Take 5 mg by mouth daily as needed for allergies. 10/26/20   Hendricks Limes F, FNP  levothyroxine (SYNTHROID) 50 MCG tablet TAKE 1 TABLET BY MOUTH EVERY DAY 07/16/21   [name], FNP  linaclotide [facility]) 145 MCG CAPS capsule Take 1 capsule (145 mcg total) by mouth daily. To regulate bowel movements Patient taking differently: Take 145 mcg by mouth daily as needed (constipation). To regulate bowel movements 08/14/20   Claretta Fraise, MD  nebivolol (BYSTOLIC) 2.5 MG tablet TAKE 1 TABLET BY MOUTH EVERY DAY 07/16/21   O'Neal, Cassie Freer, MD  ondansetron (ZOFRAN) 4 MG tablet Take 1 tablet (4 mg total) by mouth every 8 (eight) hours as needed for nausea or vomiting. 07/30/21   [name], FNP  OneTouch Delica Lancets 62V MISC Use to test blood sugars daily as directed. DX: E11.9 10/14/19   Hawks, [name], FNP  ONETOUCH VERIO test strip USE TO TEST BLOOD SUGARS DAILY AS DIRECTED. DX: E11.9 12/28/20   Evelina Dun A, FNP  pantoprazole (PROTONIX) 40 MG tablet Take 1 tablet (40 mg total) by mouth daily. 04/25/21 10/22/21  Derek Jack, MD  pravastatin (PRAVACHOL) 40 MG tablet TAKE 1 TABLET (40 MG TOTAL) BY MOUTH ON MONDAYS , WEDNESDAYS, AND FRIDAYS AS DIRECTED 07/16/21   Hawks, Christy A, FNP  Semaglutide (RYBELSUS) 7 MG TABS Take 7 mg by mouth daily. 05/24/21   Evelina Dun A, FNP  torsemide (DEMADEX) 20 MG tablet TAKE 2 TABLETS (40 MG) DAILY-CAN TAKE ADDITIONAL 2 TABS IN AFTERNOON FOR WEIGHT GAIN 3 LBS IN 1 DAY OR 5 LBS IN 1 WEEK, OR WORSENING LOWER EXTREMITY EDEMA OR SHORTNESS OF BREATH 07/16/21   Evelina Dun A, FNP  traZODone (DESYREL) 50 MG tablet TAKE 1-2 TABLETS BY MOUTH AT BEDTIME AS NEEDED FOR SLEEP. 08/22/21   Evelina Dun A, FNP      Allergies    Sulfa antibiotics, Atorvastatin, Jardiance [empagliflozin], Lopressor [metoprolol], Rosuvastatin, Doxycycline, and Temazepam    Review of Systems   Review of Systems  Constitutional:  Negative for fever.   Respiratory:  Positive for shortness of breath.   Cardiovascular:  Positive for leg swelling. Negative for chest pain.  Gastrointestinal:  Positive for abdominal distention. Negative for nausea and vomiting.  Musculoskeletal:  Negative for neck pain.  Neurological:  Negative for dizziness, weakness, light-headedness and headaches.  All other systems reviewed and are negative.  Physical Exam Updated Vital Signs BP 127/64   Pulse 83   Temp 98.3 F (36.8 C) (Oral)   Resp 14   Ht _0  (1.676 m)   Wt 74.6 kg   SpO2 100%   BMI 26.53 kg/m  Physical Exam Vitals and nursing note reviewed.  Constitutional:      General: He is not in acute distress.    Appearance: He is well-developed. He is not ill-appearing, toxic-appearing  or diaphoretic.  HENT:     Head: Normocephalic and atraumatic.     Nose: Nose normal.     Mouth/Throat:     Mouth: Mucous membranes are moist.     Pharynx: Oropharynx is clear.  Eyes:     Extraocular Movements: Extraocular movements intact.     Pupils: Pupils are equal, round, and reactive to light.  Cardiovascular:     Rate and Rhythm: Normal rate and regular rhythm.     Comments: Minimal edema bilaterally to lower extremities Pulmonary:     Effort: Pulmonary effort is normal.     Breath sounds: Decreased breath sounds and wheezing present.  Abdominal:     General: Bowel sounds are normal. There is distension.     Tenderness: There is no abdominal tenderness.  Musculoskeletal:     Cervical back: Normal range of motion and neck supple.     Right lower leg: Edema present.     Left lower leg: Edema present.  Skin:    General: Skin is warm and dry.     Capillary Refill: Capillary refill takes less than 2 seconds.  Neurological:     General: No focal deficit present.     Mental Status: He is alert and oriented to person, place, and time.     GCS: GCS eye subscore is 4. GCS verbal subscore is 5. GCS motor subscore is 6.     Cranial Nerves: Cranial nerves  2-12 are intact. No cranial nerve deficit.     Sensory: Sensation is intact. No sensory deficit.     Motor: Motor function is intact. No weakness.     Coordination: Coordination is intact. Heel to Shin Test normal.    ED Results / Procedures / Treatments   Labs (all labs ordered are listed, but only abnormal results are displayed) Labs Reviewed  BASIC METABOLIC PANEL - Abnormal; Notable for the following components:      Result Value   Glucose, Bld 127 (*)    Calcium 8.8 (*)    All other components within normal limits  CBC WITH DIFFERENTIAL/PLATELET - Abnormal; Notable for the following components:   RBC 2.89 (*)    Hemoglobin 10.1 (*)    HCT 33.3 (*)    MCV 115.2 (*)    MCH 34.9 (*)    nRBC 0.3 (*)    Lymphs Abs 0.5 (*)    All other components within normal limits  BRAIN NATRIURETIC PEPTIDE - Abnormal; Notable for the following components:   B Natriuretic Peptide 407.4 (*)    All other components within normal limits  BLOOD GAS, VENOUS - Abnormal; Notable for the following components:   pO2, Ven 53 (*)    Bicarbonate 32.5 (*)    Acid-Base Excess 5.8 (*)    All other components within normal limits  GLUCOSE, CAPILLARY - Abnormal; Notable for the following components:   Glucose-Capillary 155 (*)    All other components within normal limits  TROPONIN I (HIGH SENSITIVITY) - Abnormal; Notable for the following components:   Troponin I (High Sensitivity) 26 (*)    All other components within normal limits  HEMOGLOBIN A1C  COMPREHENSIVE METABOLIC PANEL  MAGNESIUM  CBC WITH DIFFERENTIAL/PLATELET    EKG EKG Interpretation  Date/Time:  Sunday Aug 25 2021 17:04:43 EDT Ventricular Rate:  86 PR Interval:  174 QRS Duration: 122 QT Interval:  430 QTC Calculation: 514 R Axis:   49 Text Interpretation: Normal sinus rhythm Right bundle branch block Abnormal ECG When compared  with ECG of 09-Oct-2020 23:29, PREVIOUS ECG IS PRESENT Confirmed by Dene Gentry [phone]) on 08/25/2021  6:00:33 PM  Radiology DG Chest 2 View  Result Date: 08/25/2021 CLINICAL DATA:  Shortness of breath EXAM: CHEST - 2 VIEW COMPARISON:  08/12/2021 FINDINGS: Left subclavian approach chest port remains in place. Stable heart size. Aortic atherosclerosis. Radiation changes within the bilateral upper lobes. No evidence of a new airspace opacity. No pleural effusion or pneumothorax. IMPRESSION: No active cardiopulmonary disease. Electronically Signed   By: Davina Poke D.O.   On: 08/25/2021 17:45   CT Head Wo Contrast  Result Date: 08/25/2021 CLINICAL DATA:  Head trauma, moderate-severe fall on thinners; Neck trauma (Age >= 65y) EXAM: CT HEAD WITHOUT CONTRAST CT CERVICAL SPINE WITHOUT CONTRAST TECHNIQUE: Multidetector CT imaging of the head and cervical spine was performed following the standard protocol without intravenous contrast. Multiplanar CT image reconstructions of the cervical spine were also generated. RADIATION DOSE REDUCTION: This exam was performed according to the departmental dose-optimization program which includes automated exposure control, adjustment of the mA and/or kV according to patient size and/or use of iterative reconstruction technique. COMPARISON:  11/30/2020, 06/19/2021 FINDINGS: CT HEAD FINDINGS Brain: No evidence of acute infarction, hemorrhage, hydrocephalus, extra-axial collection or mass lesion/mass effect. Patchy low-density changes within the periventricular and subcortical white matter compatible with chronic microvascular ischemic change. Mild diffuse cerebral volume loss. Vascular: Atherosclerotic calcifications involving the large vessels of the skull base. No unexpected hyperdense vessel. Skull: Normal. Negative for fracture or focal lesion. Sinuses/Orbits: No acute finding. Other: Negative for scalp hematoma. CT CERVICAL SPINE FINDINGS Alignment: Facet joints are aligned without dislocation or traumatic listhesis. Dens and lateral masses are aligned. Skull base and  vertebrae: No acute fracture. No primary bone lesion or focal pathologic process. Soft tissues and spinal canal: No prevertebral fluid or swelling. No visible canal hematoma. Disc levels: Degenerative disc disease is most pronounced at C5-6 and C6-7. Multilevel bilateral facet arthropathy is worse on the left. Upper chest: Partially visualized radiation changes within the included bilateral lung apices, similar in appearance to chest CT 06/19/2021. Other: Bilateral carotid atherosclerosis with suspected high-grade stenosis on the right (series 5, image 52). IMPRESSION: 1. No acute intracranial abnormality. 2. No acute cervical spine fracture or subluxation. 3. Chronic microvascular ischemic change and cerebral volume loss. 4. Bilateral carotid atherosclerosis with suspected high-grade stenosis on the right. Further evaluation with carotid ultrasound is recommended. Electronically Signed   By: Davina Poke D.O.   On: 08/25/2021 17:51   CT Cervical Spine Wo Contrast  Result Date: 08/25/2021 CLINICAL DATA:  Head trauma, moderate-severe fall on thinners; Neck trauma (Age >= 65y) EXAM: CT HEAD WITHOUT CONTRAST CT CERVICAL SPINE WITHOUT CONTRAST TECHNIQUE: Multidetector CT imaging of the head and cervical spine was performed following the standard protocol without intravenous contrast. Multiplanar CT image reconstructions of the cervical spine were also generated. RADIATION DOSE REDUCTION: This exam was performed according to the departmental dose-optimization program which includes automated exposure control, adjustment of the mA and/or kV according to patient size and/or use of iterative reconstruction technique. COMPARISON:  11/30/2020, 06/19/2021 FINDINGS: CT HEAD FINDINGS Brain: No evidence of acute infarction, hemorrhage, hydrocephalus, extra-axial collection or mass lesion/mass effect. Patchy low-density changes within the periventricular and subcortical white matter compatible with chronic microvascular  ischemic change. Mild diffuse cerebral volume loss. Vascular: Atherosclerotic calcifications involving the large vessels of the skull base. No unexpected hyperdense vessel. Skull: Normal. Negative for fracture or focal lesion. Sinuses/Orbits: No acute finding. Other: Negative  for scalp hematoma. CT CERVICAL SPINE FINDINGS Alignment: Facet joints are aligned without dislocation or traumatic listhesis. Dens and lateral masses are aligned. Skull base and vertebrae: No acute fracture. No primary bone lesion or focal pathologic process. Soft tissues and spinal canal: No prevertebral fluid or swelling. No visible canal hematoma. Disc levels: Degenerative disc disease is most pronounced at C5-6 and C6-7. Multilevel bilateral facet arthropathy is worse on the left. Upper chest: Partially visualized radiation changes within the included bilateral lung apices, similar in appearance to chest CT 06/19/2021. Other: Bilateral carotid atherosclerosis with suspected high-grade stenosis on the right (series 5, image 52). IMPRESSION: 1. No acute intracranial abnormality. 2. No acute cervical spine fracture or subluxation. 3. Chronic microvascular ischemic change and cerebral volume loss. 4. Bilateral carotid atherosclerosis with suspected high-grade stenosis on the right. Further evaluation with carotid ultrasound is recommended. Electronically Signed   By: Davina Poke D.O.   On: 08/25/2021 17:51    Procedures Procedures    Medications Ordered in ED Medications  apixaban (ELIQUIS) tablet 5 mg (5 mg Oral Given 08/25/21 2142)  aspirin EC tablet 81 mg (has no administration in time range)  clopidogrel (PLAVIX) tablet 75 mg (has no administration in time range)  fluticasone (FLONASE) 50 MCG/ACT nasal spray 1 spray (has no administration in time range)  gabapentin (NEURONTIN) capsule 300 mg (300 mg Oral Given 08/25/21 2142)  ferrous sulfate tablet 325 mg (325 mg Oral Given 08/25/21 2142)  levothyroxine (SYNTHROID) tablet  50 mcg (has no administration in time range)  linaclotide (LINZESS) capsule 145 mcg (has no administration in time range)  nebivolol (BYSTOLIC) tablet 2.5 mg (has no administration in time range)  pantoprazole (PROTONIX) EC tablet 40 mg (has no administration in time range)  pravastatin (PRAVACHOL) tablet 40 mg (has no administration in time range)  torsemide (DEMADEX) tablet 40 mg (has no administration in time range)  insulin aspart (novoLOG) injection 0-15 Units (3 Units Subcutaneous Given 08/25/21 2142)  sodium chloride flush (NS) 0.9 % injection 3 mL (3 mLs Intravenous Given 08/25/21 2145)  acetaminophen (TYLENOL) tablet 650 mg (has no administration in time range)    Or  acetaminophen (TYLENOL) suppository 650 mg (has no administration in time range)  polyethylene glycol (MIRALAX / GLYCOLAX) packet 17 g (has no administration in time range)  ondansetron (ZOFRAN) tablet 4 mg (has no administration in time range)    Or  ondansetron (ZOFRAN) injection 4 mg (has no administration in time range)  methylPREDNISolone sodium succinate (SOLU-MEDROL) 40 mg/mL injection 40 mg (has no administration in time range)  ipratropium-albuterol (DUONEB) 0.5-2.5 (3) MG/3ML nebulizer solution 3 mL (has no administration in time range)  ipratropium-albuterol (DUONEB) 0.5-2.5 (3) MG/3ML nebulizer solution 3 mL (has no administration in time range)  hydrOXYzine (ATARAX) tablet 25 mg (has no administration in time range)  nicotine (NICODERM CQ - dosed in mg/24 hours) patch 21 mg (has no administration in time range)  feeding supplement (ENSURE ENLIVE / ENSURE PLUS) liquid 237 mL (has no administration in time range)  melatonin tablet 10 mg (has no administration in time range)  ipratropium-albuterol (DUONEB) 0.5-2.5 (3) MG/3ML nebulizer solution 3 mL (3 mLs Nebulization Given 08/25/21 1754)  furosemide (LASIX) injection 40 mg (40 mg Intravenous Given 08/25/21 1754)  methylPREDNISolone sodium succinate (SOLU-MEDROL)  125 mg/2 mL injection 125 mg (125 mg Intravenous Given 08/25/21 1928)    ED Course/ Medical Decision Making/ A&P  Medical Decision Making Amount and/or Complexity of Data Reviewed Labs: ordered. Radiology: ordered.  Risk Prescription drug management.   76 year old male presents ED for evaluation of shortness of breath.  Please see HPI for further details.  On examination, the patient is afebrile, nontachycardic.  The patient's lung sounds are clear bilaterally and the patient is nonhypoxic on room air.  The patient's abdomen is nontender but appears distended, the patient also reports that he feels as if his abdomen is distended.  Patient is alert and oriented x4.  Patient neurological examination shows no focal neurodeficits.  Patient worked up utilizing the following labs imaging studies interpreted by me personally: - BNP elevated at 407 however on chart review this is in line with the patient's baseline - Troponin elevated at 26 however again this is in line with patient baseline, patient denies chest pain - BMP shows elevated glucose to 127 however this is noncontributory to patient's symptoms - CBC unremarkable, decreased hemoglobin at 10.1 however again in line with patient baseline - Chest x-ray does not show any signs of vascular congestion, consolidation, effusion - CT C-spine and CT head do not show any signs of fracture, subluxation, intracranial abnormality  Patient treated with DuoNeb, 40 mg Lasix, 125 Solu-Medrol.  After medications, patient reassessed and found to have decreased wheezing however wheezing still present.  The patient was on oxygen when he arrived.  I ambulated this patient off of his oxygen and his oxygen saturation dropped down to 77%.  The patient was then placed back on oxygen at 3 L/min and his oxygen saturation recovered to 96%.  Due to this, I feel that this patient should be admitted for further management.  Dr. Cyd Silence,  hospitalist team, has been consulted to admit the patient.  Dr. Cyd Silence has agreed to admit the patient.  Patient is amenable to the plan.  Patient is stable at time of admission.   Final Clinical Impression(s) / ED Diagnoses Final diagnoses:  COPD exacerbation [facility])    Rx / DC Orders ED Discharge Orders     None         Lawana Chambers 08/25/21 2211    Valarie Merino, MD 08/26/21 [phone]

## 2021-08-25 NOTE — H&P (Addendum)
History and Physical    Patient: John [name] MRN: [id number] DOA: 08/25/2021  Date of Service: the patient was seen and examined on 08/25/2021  Patient coming from: Urgent Care Clinic via EMS  Chief Complaint:  Chief Complaint  Patient presents with   Shortness of Breath    HPI:   76 year old male with past medical history of non-small cell lung cancer Stage IIIa (S/P chemo and radiation therapy, follows with Dr. 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  Patient explains that for the past several weeks he has been feeling more short of breath than his baseline.  Patient shortness breath is progressively worsened and has become rather severe in intensity for the past 3 days.  Patient's shortness of breath has become so severe that he has developed generalized weakness leading to at least 2 falls.  In the past few days patient reports increasing abdominal girth and "tightness" along with episodes of paroxysmal nocturnal dyspnea in the past several days.  Patient denies new onset of cough or peripheral edema however.  Patient denies any head trauma or loss of consciousness.  Patient denies any associated chest pain, fevers, recent travel or sick contacts.    As patient's symptoms continued to worsen he thought it may be congestive heart failure he took an additional 40 mg of Torsemide in addition to what he usually takes  with no improvement in symptoms.  Patient saw his primary care provider on 5/19 complaining of worsening symptoms of weakness.  A basic work-up was undertaken including urinalysis chemistry and CBC all of which were relatively unremarkable due to persisting symptoms the patient then presented to a local urgent care clinic earlier in the day on 5/21 where he was found to be hypoxic and sent to [facility] emergency department via EMS for further evaluation.  Upon evaluation in the emergency department CT imaging of the head and neck were performed due to recent falls revealing no acute intracranial abnormality but identifying high-grade stenosis on the right.  Chest x-ray revealed no pneumonia.  Clinically patient was felt to be suffering from a COPD exacerbation and was administered bronchodilator therapy and systemic steroids with some improvement in symptoms.  Due to continued significant shortness of breath and hypoxia with minimal exertion the hospitalist group has been called to assess patient for admission to the hospital.    Review of Systems: Review of Systems  Respiratory:  Positive for shortness of breath.   Musculoskeletal:  Positive for falls.  Neurological:  Positive for weakness.  All other systems reviewed and are negative.   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   Anxiety    Arthritis    Asthma    BPH (benign prostatic hyperplasia)    with urinary retention 02/06/20   CHF (congestive heart failure) (HCC)    COPD (chronic obstructive pulmonary disease) (HCC)    Depression    Diabetes mellitus, type II (HCC)    Dyspnea    History of kidney stones    History of  radiation therapy    right lung 09/10/2020-09/17/2020   Dr Sondra Come   Hyperlipidemia    Hypertension    Hypothyroidism    Macular degeneration    Neuropathy    Non-small cell lung cancer, right ([place]) 04/18/2016   Peripheral vascular disease ([place])    Prostatitis     Pulmonary nodule, left 07/16/2016   Sleep apnea    cpap    Past Surgical History:  Procedure Laterality Date   ABDOMINAL AORTOGRAM W/LOWER EXTREMITY Left 02/06/2020   Procedure: ABDOMINAL AORTOGRAM W/LOWER EXTREMITY;  Surgeon: Lorretta Harp, MD;  Location: [place] [facility];  Service: Cardiovascular;  Laterality: Left;   ABDOMINAL AORTOGRAM W/LOWER EXTREMITY N/A 05/30/2021   Procedure: ABDOMINAL AORTOGRAM W/LOWER EXTREMITY;  Surgeon: Marty Heck, MD;  Location: [place] [facility];  Service: Cardiovascular;  Laterality: N/A;   CARDIOVERSION N/A 10/05/2020   Procedure: CARDIOVERSION;  Surgeon: Geralynn Rile, MD;  Location: [place];  Service: Cardiovascular;  Laterality: N/A;   CATARACT EXTRACTION, BILATERAL Bilateral    COLONOSCOPY WITH PROPOFOL N/A 06/20/2021   Procedure: COLONOSCOPY WITH PROPOFOL;  Surgeon: Irene Shipper, MD;  Location: [facility];  Service: Endoscopy;  Laterality: N/A;   ENDARTERECTOMY FEMORAL Right 08/20/2020   Procedure: ENDARTERECTOMY  RIGHT FEMORAL ARTERY;  Surgeon: Marty Heck, MD;  Location: [place];  Service: Vascular;  Laterality: Right;   ESOPHAGOGASTRODUODENOSCOPY (EGD) WITH PROPOFOL N/A 09/06/2020   Procedure: ESOPHAGOGASTRODUODENOSCOPY (EGD) WITH PROPOFOL;  Surgeon: Irene Shipper, MD;  Location: [facility];  Service: Endoscopy;  Laterality: N/A;   INSERTION OF ILIAC STENT Right 08/20/2020   Procedure: RETROGRADE INSERTION OF RIGHT ILIAC STENT;  Surgeon: Marty Heck, MD;  Location: [place];  Service: Vascular;  Laterality: Right;   INTRAOPERATIVE ARTERIOGRAM Right 08/20/2020   Procedure: INTRA OPERATIVE ARTERIOGRAM ILIAC;  Surgeon: Marty Heck, MD;  Location: [place];  Service: Vascular;  Laterality: Right;   PATCH ANGIOPLASTY Right 08/20/2020   Procedure: PATCH ANGIOPLASTY RIGHT FEMORAL ARTERY;  Surgeon: Marty Heck, MD;  Location: [place];  Service: Vascular;  Laterality: Right;   PERIPHERAL VASCULAR BALLOON  ANGIOPLASTY Right 05/30/2021   Procedure: PERIPHERAL VASCULAR BALLOON ANGIOPLASTY;  Surgeon: Marty Heck, MD;  Location: [place] [facility];  Service: Cardiovascular;  Laterality: Right;   PORTACATH PLACEMENT Left 06/13/2016   Procedure: INSERTION PORT-A-CATH;  Surgeon: Aviva Signs, MD;  Location: [facility];  Service: General;  Laterality: Left;   TRANSURETHRAL RESECTION OF PROSTATE N/A 05/31/2020   Procedure: TRANSURETHRAL RESECTION OF THE PROSTATE (TURP);  Surgeon: Cleon Gustin, MD;  Location: [facility];  Service: Urology;  Laterality: N/A;   VIDEO BRONCHOSCOPY WITH ENDOBRONCHIAL NAVIGATION N/A 05/28/2016   Procedure: VIDEO BRONCHOSCOPY WITH ENDOBRONCHIAL NAVIGATION;  Surgeon: [name], MD;  Location: [place];  Service: Thoracic;  Laterality: N/A;   VIDEO BRONCHOSCOPY WITH ENDOBRONCHIAL ULTRASOUND N/A 05/28/2016   Procedure: VIDEO BRONCHOSCOPY WITH ENDOBRONCHIAL ULTRASOUND;  Surgeon: [name], MD;  Location: [place];  Service: Thoracic;  Laterality: N/A;    Social History:  reports that he has been smoking cigarettes. He started smoking about 60 years ago. He has a 27.50 pack-year smoking history. He has never used smokeless tobacco. He reports that he does not drink alcohol and does not use drugs.  Allergies  Allergen Reactions   Sulfa Antibiotics Swelling    Mouth swelling   Atorvastatin Other (See Comments)    Muscle aches - tolerating Pravastatin 40 mg MWF   Jardiance [Empagliflozin] Other (  See Comments)    FEELS SLUGGISH, TIRED   Lopressor [Metoprolol] Other (See Comments)    Fatigue   Rosuvastatin Other (See Comments)    Muscle aches - tolerating Pravastatin 40 mg MWF   Doxycycline Nausea Only   Temazepam Other (See Comments)    Made insomnia worse     Family History  Problem Relation Age of Onset   Hypertension Mother    Diabetes Father    Heart disease Father    Stroke Father    Hypertension Sister     Prior to Admission medications    Medication Sig Start Date End Date Taking? Authorizing Provider  albuterol (VENTOLIN HFA) 108 (90 Base) MCG/ACT inhaler Inhale 2 puffs into the lungs every 4 (four) hours as needed for wheezing or shortness of breath. 03/05/21   [name], FNP  apixaban (ELIQUIS) 5 MG TABS tablet TAKE 1 TABLET BY MOUTH TWICE A DAY 04/16/21   O'Neal, Cassie Freer, MD  aspirin EC 81 MG tablet Take 81 mg by mouth daily. Swallow whole.    [provider]  Blood Glucose Monitoring Suppl (ONETOUCH VERIO REFLECT) w/Device KIT Use to test blood sugars daily as directed. DX: E11.9 10/14/19   Evelina Dun A, FNP  clopidogrel (PLAVIX) 75 MG tablet TAKE 1 TABLET BY MOUTH EVERY DAY 07/16/21   O'Neal, Cassie Freer, MD  Cyanocobalamin (B-12 PO) Take 1 tablet by mouth daily.    [provider]  doxycycline (VIBRA-TABS) 100 MG tablet Take 1 tablet (100 mg total) by mouth 2 (two) times daily. Patient not taking: Reported on 08/23/2021 08/12/21   Evelina Dun A, FNP  ezetimibe (ZETIA) 10 MG tablet Take 1 tablet (10 mg total) by mouth daily. 02/26/21 06/17/21  Geralynn Rile, MD  fluticasone (FLONASE) 50 MCG/ACT nasal spray PLACE 1 SPRAY INTO BOTH NOSTRILS DAILY AS NEEDED FOR ALLERGIES OR RHINITIS. 04/17/21   Evelina Dun A, FNP  Fluticasone-Umeclidin-Vilant (TRELEGY ELLIPTA) 100-62.5-25 MCG/INH AEPB Inhale 1 puff into the lungs daily. 11/08/20   Tanda Rockers, MD  gabapentin (NEURONTIN) 300 MG capsule TAKE 1 CAPSULE BY MOUTH THREE TIMES A DAY 07/16/21   Evelina Dun A, FNP  hydrOXYzine (VISTARIL) 25 MG capsule Take 1 capsule (25 mg total) by mouth 3 (three) times daily as needed for itching. 11/01/20   [name], FNP  Iron, Ferrous Sulfate, 325 (65 Fe) MG TABS Take 325 mg by mouth 2 (two) times daily. 04/23/21   [name], FNP  KLOR-CON M20 20 MEQ tablet TAKE 1 TABLET BY MOUTH EVERY DAY 07/16/21   Evelina Dun A, FNP  levocetirizine (XYZAL) 5 MG tablet Take 1 tablet (5 mg total) by mouth  at bedtime. Patient taking differently: Take 5 mg by mouth daily as needed for allergies. 10/26/20   Hendricks Limes F, FNP  levothyroxine (SYNTHROID) 50 MCG tablet TAKE 1 TABLET BY MOUTH EVERY DAY 07/16/21   [name], FNP  linaclotide [facility]) 145 MCG CAPS capsule Take 1 capsule (145 mcg total) by mouth daily. To regulate bowel movements Patient taking differently: Take 145 mcg by mouth daily as needed (constipation). To regulate bowel movements 08/14/20   Claretta Fraise, MD  nebivolol (BYSTOLIC) 2.5 MG tablet TAKE 1 TABLET BY MOUTH EVERY DAY 07/16/21   O'Neal, Cassie Freer, MD  ondansetron (ZOFRAN) 4 MG tablet Take 1 tablet (4 mg total) by mouth every 8 (eight) hours as needed for nausea or vomiting. 07/30/21   [name], [place]  30G MISC Use to test blood sugars daily as directed. DX: E11.9 10/14/19   Hawks, [name], FNP  ONETOUCH VERIO test strip USE TO TEST BLOOD SUGARS DAILY AS DIRECTED. DX: E11.9 12/28/20   Evelina Dun A, FNP  pantoprazole (PROTONIX) 40 MG tablet Take 1 tablet (40 mg total) by mouth daily. 04/25/21 10/22/21  Derek Jack, MD  pravastatin (PRAVACHOL) 40 MG tablet TAKE 1 TABLET (40 MG TOTAL) BY MOUTH ON MONDAYS , WEDNESDAYS, AND FRIDAYS AS DIRECTED 07/16/21   Hawks, Christy A, FNP  Semaglutide (RYBELSUS) 7 MG TABS Take 7 mg by mouth daily. 05/24/21   Evelina Dun A, FNP  torsemide (DEMADEX) 20 MG tablet TAKE 2 TABLETS (40 MG) DAILY-CAN TAKE ADDITIONAL 2 TABS IN AFTERNOON FOR WEIGHT GAIN 3 LBS IN 1 DAY OR 5 LBS IN 1 WEEK, OR WORSENING LOWER EXTREMITY EDEMA OR SHORTNESS OF BREATH 07/16/21   Evelina Dun A, FNP  traZODone (DESYREL) 50 MG tablet TAKE 1-2 TABLETS BY MOUTH AT BEDTIME AS NEEDED FOR SLEEP. 08/22/21   [name], FNP    Physical Exam:  Vitals:   08/25/21 1641 08/25/21 1928 08/25/21 2103  BP: 122/75 122/64   Pulse: 87 87   Resp: 17 (!) 22   Temp: 98.3 F (36.8 C)    TempSrc: Oral    SpO2: 100% 100%   Weight:   74.6  kg  Height:   5' 6"  (1.676 m)    Constitutional: Awake alert and oriented x3, no associated distress.   Skin: no rashes, no lesions, good skin turgor noted. Eyes: Pupils are equally reactive to light.  No evidence of scleral icterus or conjunctival pallor.  ENMT: Moist mucous membranes noted.  Posterior pharynx clear of any exudate or lesions.   Neck: normal, supple, no masses, no thyromegaly.  Notably elevated jugular venous pulse.   Respiratory: Prolonged expiratory phase with expiratory wheezing .  Increased respiratory effort. No accessory muscle use.  Cardiovascular: Regular rate and rhythm, no murmurs / rubs / gallops. No extremity edema. 2+ pedal pulses. No carotid bruits.  Chest:   Nontender without crepitus or deformity.   Back:   Nontender without crepitus or deformity. Abdomen: Abdomen is protuberant but soft and nontender.  No evidence of intra-abdominal masses.  Positive bowel sounds noted in all quadrants.   Musculoskeletal: No joint deformity upper and lower extremities. Good ROM, no contractures. Normal muscle tone.  Neurologic: CN 2-12 grossly intact. Sensation intact.  Patient moving all 4 extremities spontaneously.  Patient is following all commands.  Patient is responsive to verbal stimuli.   Psychiatric: Patient exhibits normal mood with appropriate affect.  Patient seems to possess insight as to their current situation.    Data Reviewed:  I have personally reviewed and interpreted labs, imaging.  Significant findings are:  Chest x-ray personally reviewed revealing no evidence of acute cardiopulmonary process. CT imaging of the head revealing no acute intracranial abnormality. CT imaging of the cervical spine without contrast revealing bilateral carotid atherosclerosis with suspected high-grade stenosis of the right. Chemistry revealing sodium 139, potassium 3.7, glucose 127, BUN 19, creatinine 1.2.   CBC revealing white blood cell count 7.4 hemoglobin 10.1,  hematocrit 33.3, platelet count of 277.   BNP 407.   Troponin 26.  EKG: Personally reviewed.  Rhythm is normal sinus rhythm with heart rate of 86 bpm.  Evidence of right bundle branch block.  No dynamic ST segment changes appreciated.   Assessment and Plan: * COPD with acute exacerbation [facility]) Patient  presenting with rapidly worsening shortness of breath with associated wheezing Physical exam most consistent with COPD exacerbation with notable wheezing and prolonged expiratory phase Chest x-ray reveals no evidence of concurrent pneumonia Initiating aggressive bronchodilator therapy with intravenous Solu-Medrol Supplemental oxygen initiated with target oxygen saturations of between 89 to 92%. Obtaining VBG, will follow-up Close clinical monitoring as patient is at high risk of rapid clinical decompensation.   Elevated troponin level not due myocardial infarction Slightly elevated serial troponins with flat trajectory of elevation Patient is chest pain-free Likely secondary to underlying illness, plaque rupture is unlikely Monitoring patient on telemetry   Carotid stenosis, right Incidental finding on CT neck Considering patient's reports of weakness and frequent falls over the past several weeks, will obtain carotid ultrasound while here. We will likely need outpatient vascular follow-up  Chronic diastolic CHF (congestive heart failure) (HCC) No clinical evidence of cardiogenic volume overload Strict input and output monitoring Daily weights Low-sodium diet ER provider administered a dose of intravenous Lasix which has not resulted in significant diuresis up to this point We will continue home regimen of maintenance torsemide   Type 2 diabetes mellitus with hyperglycemia, without long-term current use of insulin ([place]) Patient been placed on Accu-Cheks before every meal and nightly with sliding scale insulin Holding home regimen of hypoglycemics Hemoglobin A1C  ordered Diabetic Diet   Chronic respiratory failure with hypoxia ([place]) Patient typically requires supplemental oxygen with exertion No clinical evidence of acute on chronic respiratory failure at this time Obtaining VBG to identify any evidence of hypercapnia  Mixed diabetic hyperlipidemia associated with type 2 diabetes mellitus ([place]) Continuing home regimen of lipid lowering therapy.   Paroxysmal atrial fibrillation (HCC) Currently rate controlled. Continue home regimen of anticoagulation Continue home regimen of rate controlling agent Monitoring on telemetry   Non-small cell lung cancer, right ([place]) Continue outpatient follow-up with Dr. Delton Coombes Considering longstanding known history of malignancy and radiation patient is high risk of clinical decompensation, monitoring closely.  GERD without esophagitis Continuing home regimen of daily PPI therapy.   Nicotine dependence, cigarettes, uncomplicated Patient is being counseled daily on smoking cessation. Providing patient with nicotine replacement therapy during this hospitalization.         Code Status:  Full code  code status decision has been confirmed with: patient Family Communication: deferred   Consults: None  Severity of Illness:  The appropriate patient status for this patient is INPATIENT. Inpatient status is judged to be reasonable and necessary in order to provide the required intensity of service to ensure the patient's safety. The patient's presenting symptoms, physical exam findings, and initial radiographic and laboratory data in the context of their chronic comorbidities is felt to place them at high risk for further clinical deterioration. Furthermore, it is not anticipated that the patient will be medically stable for discharge from the hospital within 2 midnights of admission.   * I certify that at the point of admission it is my clinical judgment that the patient will require inpatient hospital  care spanning beyond 2 midnights from the point of admission due to high intensity of service, high risk for further deterioration and high frequency of surveillance required.*  Author:  Vernelle Emerald MD  08/25/2021 9:17 PM

## 2021-08-25 NOTE — Assessment & Plan Note (Addendum)
   Continue outpatient follow-up with Dr. Delton Coombes

## 2021-08-25 NOTE — Assessment & Plan Note (Addendum)
   Exam consistent with COPD exacerbation with notable wheezing and prolonged expiratory phase, likely with some degree of superimposed volume overload.  Chest x-ray reveals no evidence of concurrent pneumonia  IV steroids to PO steroids as much improved  short course of IV Furosemde 60mg  BID  Supplemental oxygen initiated with target oxygen saturations of between 89 to 92% additionally providing CPAP with sleep (hx OS)

## 2021-08-25 NOTE — Assessment & Plan Note (Signed)
·   Slightly elevated serial troponins with flat trajectory of elevation °· Patient is chest pain-free °· Likely secondary to underlying illness, plaque rupture is unlikely °· Monitoring patient on telemetry ° ° ° ° ° ° °

## 2021-08-25 NOTE — Assessment & Plan Note (Signed)
.   Continuing home regimen of lipid lowering therapy.  

## 2021-08-25 NOTE — Assessment & Plan Note (Addendum)
.   Patient reports increasing abdominal girth and weight gain in the past few days with PND . Physical exam reveals distended abdomen with elevated JVP . BNP slightly higher than baseline without obvious pulmonary edema on CXR . Providing patient with short course of 60mg  IV Lasix BID . Strict input and output monitoring . Daily weights . Low-sodium diet

## 2021-08-25 NOTE — Assessment & Plan Note (Addendum)
   Currently rate controlled.  Continue home meds

## 2021-08-25 NOTE — Assessment & Plan Note (Signed)
   Incidental finding on CT neck  Considering patient's reports of weakness and frequent falls over the past several weeks, will obtain carotid ultrasound while here.  We will likely need outpatient vascular follow-up

## 2021-08-25 NOTE — Assessment & Plan Note (Signed)
Continuing home regimen of daily PPI therapy.  

## 2021-08-25 NOTE — Assessment & Plan Note (Signed)
.   Patient is being counseled daily on smoking cessation. . Providing patient with nicotine replacement therapy during this hospitalization.   

## 2021-08-25 NOTE — Assessment & Plan Note (Addendum)
   Patient typically requires supplemental oxygen with exertion  No clinical evidence of acute on chronic respiratory failure at this time

## 2021-08-25 NOTE — ED Triage Notes (Signed)
Patient arrived via [facility] with complaints of shortness of breath, cough, and fatigue for a couple of weeks. Patient states shortness of breath and cough worsened in the last few days. Patient also reported frequent falls on Thursday and Friday (he does not remember hitting head or not), states he loss balance from being dizzy. Patient went to urgent care first and he was sent here via EMS. On 3L [place] at baseline at home.

## 2021-08-26 ENCOUNTER — Inpatient Hospital Stay (HOSPITAL_COMMUNITY): Payer: Medicare Other

## 2021-08-26 DIAGNOSIS — J441 Chronic obstructive pulmonary disease with (acute) exacerbation: Secondary | ICD-10-CM | POA: Diagnosis not present

## 2021-08-26 DIAGNOSIS — I639 Cerebral infarction, unspecified: Secondary | ICD-10-CM | POA: Diagnosis not present

## 2021-08-26 LAB — CBC WITH DIFFERENTIAL/PLATELET
Abs Immature Granulocytes: 0.02 10*3/uL (ref 0.00–0.07)
Basophils Absolute: 0 10*3/uL (ref 0.0–0.1)
Basophils Relative: 1 %
Eosinophils Absolute: 0 10*3/uL (ref 0.0–0.5)
Eosinophils Relative: 0 %
HCT: 31.5 % — ABNORMAL LOW (ref 39.0–52.0)
Hemoglobin: 9.8 g/dL — ABNORMAL LOW (ref 13.0–17.0)
Immature Granulocytes: 0 %
Lymphocytes Relative: 2 %
Lymphs Abs: 0.2 10*3/uL — ABNORMAL LOW (ref 0.7–4.0)
MCH: 35.1 pg — ABNORMAL HIGH (ref 26.0–34.0)
MCHC: 31.1 g/dL (ref 30.0–36.0)
MCV: 112.9 fL — ABNORMAL HIGH (ref 80.0–100.0)
Monocytes Absolute: 0.1 10*3/uL (ref 0.1–1.0)
Monocytes Relative: 1 %
Neutro Abs: 7.6 10*3/uL (ref 1.7–7.7)
Neutrophils Relative %: 96 %
Platelets: 260 10*3/uL (ref 150–400)
RBC: 2.79 MIL/uL — ABNORMAL LOW (ref 4.22–5.81)
RDW: 14.9 % (ref 11.5–15.5)
WBC: 8 10*3/uL (ref 4.0–10.5)
nRBC: 0 % (ref 0.0–0.2)

## 2021-08-26 LAB — TROPONIN I (HIGH SENSITIVITY): Troponin I (High Sensitivity): 20 ng/L — ABNORMAL HIGH (ref <18)

## 2021-08-26 LAB — RESP PANEL BY RT-PCR (FLU A&B, COVID) ARPGX2
Influenza A by PCR: NEGATIVE
Influenza B by PCR: NEGATIVE
SARS Coronavirus 2 by RT PCR: NEGATIVE

## 2021-08-26 LAB — HEMOGLOBIN A1C
Hgb A1c MFr Bld: 5.4 % (ref 4.8–5.6)
Mean Plasma Glucose: 108.28 mg/dL

## 2021-08-26 LAB — GLUCOSE, CAPILLARY
Glucose-Capillary: 210 mg/dL — ABNORMAL HIGH (ref 70–99)
Glucose-Capillary: 286 mg/dL — ABNORMAL HIGH (ref 70–99)
Glucose-Capillary: 223 mg/dL — ABNORMAL HIGH (ref 70–99)
Glucose-Capillary: 281 mg/dL — ABNORMAL HIGH (ref 70–99)

## 2021-08-26 MED ORDER — GLUCERNA SHAKE PO LIQD
237.0000 mL | Freq: Three times a day (TID) | ORAL | Status: DC
  Administered 2021-08-27: 237 mL via ORAL

## 2021-08-26 MED ORDER — FUROSEMIDE 10 MG/ML IJ SOLN
60.0000 mg | Freq: Two times a day (BID) | INTRAMUSCULAR | Status: AC
  Administered 2021-08-26 (×2): 60 mg via INTRAVENOUS
  Filled 2021-08-26 (×2): qty 6

## 2021-08-26 MED ORDER — IPRATROPIUM-ALBUTEROL 0.5-2.5 (3) MG/3ML IN SOLN
3.0000 mL | Freq: Four times a day (QID) | RESPIRATORY_TRACT | Status: DC | PRN
  Administered 2021-08-26: 3 mL via RESPIRATORY_TRACT
  Filled 2021-08-26: qty 3

## 2021-08-26 MED ORDER — PREDNISONE 20 MG PO TABS
40.0000 mg | ORAL_TABLET | Freq: Every day | ORAL | Status: DC
  Administered 2021-08-27: 40 mg via ORAL
  Filled 2021-08-26: qty 2

## 2021-08-26 NOTE — Progress Notes (Signed)
Initial Nutrition Assessment  DOCUMENTATION CODES:   Not applicable  INTERVENTION:  Provide Glucerna Shake po TID, each supplement provides 220 kcal and 10 grams of protein.  Encourage adequate PO intake.   NUTRITION DIAGNOSIS:   Increased nutrient needs related to chronic illness (CHF, COPD) as evidenced by estimated needs.  GOAL:   Patient will meet greater than or equal to 90% of their needs  MONITOR:   PO intake, Supplement acceptance, Skin, Weight trends, Labs, I & O's  REASON FOR ASSESSMENT:   Malnutrition Screening Tool    ASSESSMENT:   76 year old male with past medical history of non-small cell lung cancer Stage IIIa S/P chemo and radiation therapy, diastolic congestive heart failure (Echo 08/2020 EF 60-65%), COPD, chronic hypoxic respiratory failure, obstructive sleep apnea, non insluin dependent diabetes mellitus type 2. Presents with SOB, COPD exacerbation.  Meal completion has been 50%. Pt reports having a decreased appetite over the past couple of days. Prior to, pt reports eating well with no difficulties. RD to order nutritional supplements to aid in caloric and protein needs. Pt encouraged to eat his food at meals and to drink his supplements.   NUTRITION - FOCUSED PHYSICAL EXAM:  Flowsheet Row Most Recent Value  Orbital Region No depletion  Upper Arm Region No depletion  Thoracic and Lumbar Region No depletion  Buccal Region No depletion  Temple Region No depletion  Clavicle Bone Region No depletion  Clavicle and Acromion Bone Region No depletion  Scapular Bone Region Unable to assess  Dorsal Hand No depletion  Patellar Region No depletion  Anterior Thigh Region No depletion  Posterior Calf Region No depletion  Edema (RD Assessment) Mild  Hair Reviewed  Eyes Reviewed  Mouth Reviewed  Skin Reviewed  Nails Reviewed      Labs and medications reviewed.   Diet Order:   Diet Order             Diet heart healthy/carb modified Room service  appropriate? Yes; Fluid consistency: Thin  Diet effective now                   EDUCATION NEEDS:   Not appropriate for education at this time  Skin:  Skin Assessment: Reviewed RN Assessment  Last BM:  5/19  Height:   Ht Readings from Last 1 Encounters:  08/25/21 5\' 6"  (1.676 m)    Weight:   Wt Readings from Last 1 Encounters:  08/26/21 73.4 kg   BMI:  Body mass index is 26.12 kg/m.  Estimated Nutritional Needs:   Kcal:  1900-2100  Protein:  95-105 grams  Fluid:  >/= 1.9 L/day  Corrin Parker, MS, RD, LDN RD pager number/after hours weekend pager number on Am[facility].

## 2021-08-26 NOTE — Progress Notes (Signed)
PROGRESS NOTE    John [name]  QPY:[id number] DOB: December 21, 1945 DOA: 08/25/2021 PCP: [name], FNP    Brief Narrative:  76 year old male with past medical history of non-small cell lung cancer Stage IIIa (S/P chemo and radiation therapy, follows with Dr. Jamey Reas) , diastolic congestive heart failure (Echo 08/2020 EF 60-65%), COPD, chronic hypoxic respiratory failure (on supplemental oxygen with exertion) obstructive sleep apnea, non insluin dependent diabetes mellitus type 2, hypothyroidism (secondary to immunotherapy), hyperlipidemia, GERD, PAD (status post right femoral endarterectomy, right common iliac stenting, right SFA and above-the-knee popliteal angioplasty and proximal right common iliac artery angioplasty dependence on cigarettes, paroxysmal atrial fibrillation on Eliquis who presents to [facility] via EMS with complaints of shortness of breath.    Assessment and Plan: * COPD with acute exacerbation ([place]) Exam consistent with COPD exacerbation with notable wheezing and prolonged expiratory phase, likely with some degree of superimposed volume overload. Chest x-ray reveals no evidence of concurrent pneumonia IV steroids to PO steroids as much improved short course of IV Furosemde 60mg  BID Supplemental oxygen initiated with target oxygen saturations of between 89 to 92% additionally providing CPAP with sleep (hx OS)    Elevated troponin level not due myocardial infarction Slightly elevated serial troponins with flat trajectory of elevation Patient is chest pain-free Likely secondary to underlying illness, plaque rupture is unlikely Monitoring patient on telemetry   Acute on chronic diastolic CHF (congestive heart failure) ([place]) Patient reports increasing abdominal girth and weight gain in the past few days with PND Physical exam reveals distended abdomen with elevated JVP BNP slightly higher than baseline without obvious pulmonary edema on CXR Providing  patient with short course of 60mg  IV Lasix BID Strict input and output monitoring Daily weights Low-sodium diet  Carotid stenosis, right Incidental finding on CT neck Considering patient's reports of weakness and frequent falls over the past several weeks, will obtain carotid ultrasound while here. We will likely need outpatient vascular follow-up  Type 2 diabetes mellitus with hyperglycemia, without long-term current use of insulin (HCC) SSI   Chronic respiratory failure with hypoxia (HCC) Patient typically requires supplemental oxygen with exertion No clinical evidence of acute on chronic respiratory failure at this time   Mixed diabetic hyperlipidemia associated with type 2 diabetes mellitus ([place]) Continuing home regimen of lipid lowering therapy.   Paroxysmal atrial fibrillation (HCC) Currently rate controlled. Continue home meds   Non-small cell lung cancer, right ([place]) Continue outpatient follow-up with Dr. Delton Coombes   GERD without esophagitis Continuing home regimen of daily PPI therapy.   Nicotine dependence, cigarettes, uncomplicated Patient is being counseled daily on smoking cessation. Providing patient with nicotine replacement therapy during this hospitalization.            DVT prophylaxis:  apixaban (ELIQUIS) tablet 5 mg    Code Status: Full Code Family Communication:   Disposition Plan:  Level of care: Telemetry Medical Status is: Inpatient Remains inpatient appropriate because: home in AM?    Consultants:  none   Subjective: Feeling better  Objective: Vitals:   08/26/21 0638 08/26/21 0841 08/26/21 0923 08/26/21 1140  BP:  (!) 119/55  118/90  Pulse: 84 70  83  Resp: 13 16  17   Temp:    98.7 F (37.1 C)  TempSrc:    Oral  SpO2: 92% 95% 94% 96%  Weight:      Height:        Intake/Output Summary (Last 24 hours) at 08/26/2021 1332 Last data filed at 08/26/2021 0945  Gross per 24 hour  Intake 714 ml  Output 1875 ml  Net  -1161 ml   Filed Weights   08/25/21 2103 08/26/21 0224 08/26/21 0429  Weight: 74.6 kg 74.4 kg 73.4 kg    Examination:   General: Appearance:     Overweight male in no acute distress     Lungs:     respirations unlabored, rhonchi occasionally  Heart:    Normal heart rate.   MS:   All extremities are intact.    Neurologic:   Awake, alert, oriented x 3. No apparent focal neurological           defect.        Data Reviewed: I have personally reviewed following labs and imaging studies  CBC: Recent Labs  Lab 08/23/21 1129 08/25/21 1716 08/25/21 2325  WBC 8.2 7.4 8.0  NEUTROABS 6.7 5.7 7.6  HGB 10.4* 10.1* 9.8*  HCT 32.4* 33.3* 31.5*  MCV 108* 115.2* 112.9*  PLT 302 277 237   Basic Metabolic Panel: Recent Labs  Lab 08/23/21 1129 08/25/21 1716 08/25/21 2325  NA 140 139 137  K 4.6 3.7 3.6  CL 98 102 101  CO2 22 29 26   GLUCOSE 161* 127* 175*  BUN 24 19 17   CREATININE 1.31* 1.20 1.09  CALCIUM 9.4 8.8* 8.6*  MG  --   --  1.9   GFR: Estimated Creatinine Clearance: 52.8 mL/min (by C-G formula based on SCr of 1.09 mg/dL). Liver Function Tests: Recent Labs  Lab 08/23/21 1129 08/25/21 2325  AST 16 13*  ALT 11 12  ALKPHOS 123* 89  BILITOT 0.3 0.8  PROT 6.8 6.4*  ALBUMIN 4.2 3.2*   No results for input(s): LIPASE, AMYLASE in the last 168 hours. No results for input(s): AMMONIA in the last 168 hours. Coagulation Profile: No results for input(s): INR, PROTIME in the last 168 hours. Cardiac Enzymes: No results for input(s): CKTOTAL, CKMB, CKMBINDEX, TROPONINI in the last 168 hours. BNP (last 3 results) No results for input(s): PROBNP in the last 8760 hours. HbA1C: Recent Labs    08/25/21 2325  HGBA1C 5.4   CBG: Recent Labs  Lab 08/25/21 2114 08/26/21 0608 08/26/21 1138  GLUCAP 155* 210* 286*   Lipid Profile: No results for input(s): CHOL, HDL, LDLCALC, TRIG, CHOLHDL, LDLDIRECT in the last 72 hours. Thyroid Function Tests: No results for  input(s): TSH, T4TOTAL, FREET4, T3FREE, THYROIDAB in the last 72 hours. Anemia Panel: No results for input(s): VITAMINB12, FOLATE, FERRITIN, TIBC, IRON, RETICCTPCT in the last 72 hours. Sepsis Labs: No results for input(s): PROCALCITON, LATICACIDVEN in the last 168 hours.  Recent Results (from the past 240 hour(s))  Microscopic Examination     Status: Abnormal   Collection Time: 08/23/21 10:58 AM   Urine  Result Value Ref Range Status   WBC, UA 0-5 0 - 5 /hpf Final   RBC None seen 0 - 2 /hpf Final   Epithelial Cells (non renal) 0-10 0 - 10 /hpf Final   Renal Epithel, UA None seen None seen /hpf Final   Bacteria, UA Few (A) None seen/Few Final         Radiology Studies: DG Chest 2 View  Result Date: 08/25/2021 CLINICAL DATA:  Shortness of breath EXAM: CHEST - 2 VIEW COMPARISON:  08/12/2021 FINDINGS: Left subclavian approach chest port remains in place. Stable heart size. Aortic atherosclerosis. Radiation changes within the bilateral upper lobes. No evidence of a new airspace opacity. No pleural effusion or pneumothorax. IMPRESSION: No  active cardiopulmonary disease. Electronically Signed   By: Davina Poke D.O.   On: 08/25/2021 17:45   CT Head Wo Contrast  Result Date: 08/25/2021 CLINICAL DATA:  Head trauma, moderate-severe fall on thinners; Neck trauma (Age >= 65y) EXAM: CT HEAD WITHOUT CONTRAST CT CERVICAL SPINE WITHOUT CONTRAST TECHNIQUE: Multidetector CT imaging of the head and cervical spine was performed following the standard protocol without intravenous contrast. Multiplanar CT image reconstructions of the cervical spine were also generated. RADIATION DOSE REDUCTION: This exam was performed according to the departmental dose-optimization program which includes automated exposure control, adjustment of the mA and/or kV according to patient size and/or use of iterative reconstruction technique. COMPARISON:  11/30/2020, 06/19/2021 FINDINGS: CT HEAD FINDINGS Brain: No evidence of  acute infarction, hemorrhage, hydrocephalus, extra-axial collection or mass lesion/mass effect. Patchy low-density changes within the periventricular and subcortical white matter compatible with chronic microvascular ischemic change. Mild diffuse cerebral volume loss. Vascular: Atherosclerotic calcifications involving the large vessels of the skull base. No unexpected hyperdense vessel. Skull: Normal. Negative for fracture or focal lesion. Sinuses/Orbits: No acute finding. Other: Negative for scalp hematoma. CT CERVICAL SPINE FINDINGS Alignment: Facet joints are aligned without dislocation or traumatic listhesis. Dens and lateral masses are aligned. Skull base and vertebrae: No acute fracture. No primary bone lesion or focal pathologic process. Soft tissues and spinal canal: No prevertebral fluid or swelling. No visible canal hematoma. Disc levels: Degenerative disc disease is most pronounced at C5-6 and C6-7. Multilevel bilateral facet arthropathy is worse on the left. Upper chest: Partially visualized radiation changes within the included bilateral lung apices, similar in appearance to chest CT 06/19/2021. Other: Bilateral carotid atherosclerosis with suspected high-grade stenosis on the right (series 5, image 52). IMPRESSION: 1. No acute intracranial abnormality. 2. No acute cervical spine fracture or subluxation. 3. Chronic microvascular ischemic change and cerebral volume loss. 4. Bilateral carotid atherosclerosis with suspected high-grade stenosis on the right. Further evaluation with carotid ultrasound is recommended. Electronically Signed   By: Davina Poke D.O.   On: 08/25/2021 17:51   CT Cervical Spine Wo Contrast  Result Date: 08/25/2021 CLINICAL DATA:  Head trauma, moderate-severe fall on thinners; Neck trauma (Age >= 65y) EXAM: CT HEAD WITHOUT CONTRAST CT CERVICAL SPINE WITHOUT CONTRAST TECHNIQUE: Multidetector CT imaging of the head and cervical spine was performed following the standard  protocol without intravenous contrast. Multiplanar CT image reconstructions of the cervical spine were also generated. RADIATION DOSE REDUCTION: This exam was performed according to the departmental dose-optimization program which includes automated exposure control, adjustment of the mA and/or kV according to patient size and/or use of iterative reconstruction technique. COMPARISON:  11/30/2020, 06/19/2021 FINDINGS: CT HEAD FINDINGS Brain: No evidence of acute infarction, hemorrhage, hydrocephalus, extra-axial collection or mass lesion/mass effect. Patchy low-density changes within the periventricular and subcortical white matter compatible with chronic microvascular ischemic change. Mild diffuse cerebral volume loss. Vascular: Atherosclerotic calcifications involving the large vessels of the skull base. No unexpected hyperdense vessel. Skull: Normal. Negative for fracture or focal lesion. Sinuses/Orbits: No acute finding. Other: Negative for scalp hematoma. CT CERVICAL SPINE FINDINGS Alignment: Facet joints are aligned without dislocation or traumatic listhesis. Dens and lateral masses are aligned. Skull base and vertebrae: No acute fracture. No primary bone lesion or focal pathologic process. Soft tissues and spinal canal: No prevertebral fluid or swelling. No visible canal hematoma. Disc levels: Degenerative disc disease is most pronounced at C5-6 and C6-7. Multilevel bilateral facet arthropathy is worse on the left. Upper chest: Partially visualized radiation  changes within the included bilateral lung apices, similar in appearance to chest CT 06/19/2021. Other: Bilateral carotid atherosclerosis with suspected high-grade stenosis on the right (series 5, image 52). IMPRESSION: 1. No acute intracranial abnormality. 2. No acute cervical spine fracture or subluxation. 3. Chronic microvascular ischemic change and cerebral volume loss. 4. Bilateral carotid atherosclerosis with suspected high-grade stenosis on the  right. Further evaluation with carotid ultrasound is recommended. Electronically Signed   By: Davina Poke D.O.   On: 08/25/2021 17:51        Scheduled Meds:  apixaban  5 mg Oral BID   aspirin EC  81 mg Oral Daily   clopidogrel  75 mg Oral Daily   feeding supplement  237 mL Oral BID BM   ferrous sulfate  325 mg Oral BID   furosemide  60 mg Intravenous BID   gabapentin  300 mg Oral TID   insulin aspart  0-15 Units Subcutaneous TID AC & HS   ipratropium-albuterol  3 mL Nebulization Q6H   levothyroxine  50 mcg Oral Daily   methylPREDNISolone (SOLU-MEDROL) injection  40 mg Intravenous Q12H   nebivolol  2.5 mg Oral Daily   nicotine  21 mg Transdermal Daily   pantoprazole  40 mg Oral Daily   pravastatin  40 mg Oral Once per day on Mon Wed Fri   [START ON 08/27/2021] predniSONE  40 mg Oral Q breakfast   sodium chloride flush  3 mL Intravenous Q12H   Continuous Infusions:   LOS: 1 day    Time spent: 55 minutes spent on chart review, discussion with nursing staff, consultants, updating family and interview/physical exam; more than 50% of that time was spent in counseling and/or coordination of care.    Geradine Girt, DO [facility] Available via Epic secure chat 7am-7pm After these hours, please refer to coverage provider listed on [email or web address] 08/26/2021, 1:32 PM

## 2021-08-26 NOTE — Hospital Course (Signed)
76 year old male with past medical history of non-small cell lung cancer Stage IIIa (S/P chemo and radiation therapy, follows with Dr. Jamey Reas) , diastolic congestive heart failure (Echo 08/2020 EF 60-65%), COPD, chronic hypoxic respiratory failure (on supplemental oxygen with exertion) obstructive sleep apnea, non insluin dependent diabetes mellitus type 2, hypothyroidism (secondary to immunotherapy), hyperlipidemia, GERD, PAD (status post right femoral endarterectomy, right common iliac stenting, right SFA and above-the-knee popliteal angioplasty and proximal right common iliac artery angioplasty dependence on cigarettes, paroxysmal atrial fibrillation on Eliquis who presents to [facility] via EMS with complaints of shortness of breath.

## 2021-08-26 NOTE — Progress Notes (Signed)
Carotid artery duplex completed. Refer to "CV Proc" under chart review to view preliminary results.  08/26/2021 3:31 PM Kelby Aline., MHA, RVT, RDCS, RDMS

## 2021-08-27 ENCOUNTER — Other Ambulatory Visit: Payer: Self-pay | Admitting: Family Medicine

## 2021-08-27 DIAGNOSIS — J441 Chronic obstructive pulmonary disease with (acute) exacerbation: Secondary | ICD-10-CM | POA: Diagnosis not present

## 2021-08-27 LAB — BASIC METABOLIC PANEL
Anion gap: 10 (ref 5–15)
BUN: 34 mg/dL — ABNORMAL HIGH (ref 8–23)
CO2: 31 mmol/L (ref 22–32)
Calcium: 9.5 mg/dL (ref 8.9–10.3)
Chloride: 98 mmol/L (ref 98–111)
Creatinine, Ser: 1.21 mg/dL (ref 0.61–1.24)
GFR, Estimated: >60 mL/min (ref >60)
Glucose, Bld: 223 mg/dL — ABNORMAL HIGH (ref 70–99)
Potassium: 3.7 mmol/L (ref 3.5–5.1)
Sodium: 139 mmol/L (ref 135–145)

## 2021-08-27 LAB — GLUCOSE, CAPILLARY
Glucose-Capillary: 285 mg/dL — ABNORMAL HIGH (ref 70–99)
Glucose-Capillary: 195 mg/dL — ABNORMAL HIGH (ref 70–99)

## 2021-08-27 MED ORDER — ALBUTEROL SULFATE (2.5 MG/3ML) 0.083% IN NEBU: 2.5000 mg | INHALATION_SOLUTION | RESPIRATORY_TRACT | 0 refills | Status: DC | PRN

## 2021-08-27 MED ORDER — PREDNISONE 20 MG PO TABS: 40.0000 mg | ORAL_TABLET | Freq: Every day | ORAL | 0 refills | Status: AC

## 2021-08-27 NOTE — TOC Initial Note (Addendum)
Transition of Care [facility]) - Initial/Assessment Note    Patient Details  Name: John [name] MRN: [id number] Date of Birth: 1945/11/14  [facility]) CM/SW Contact:    Zenon Mayo, RN Phone Number: 08/27/2021, 11:50 AM  Clinical Narrative:                 Patient is from home with wife, he has home oxygen with [employer], 2 liters prn.  He has a walker and a shower stool and a cane, he states he had HHPT and HHRN with [place] before and would like to have them again.  NCM made referral to [facility] with [facility] , she is able to take referral.  Soc will begin 24 to 48 hrs post dc.  Wife will transport him home at dc. Per MD he will need a neb machine as well. Patient would like this to be delivered to his home.  He is ok with [employer] supplying this.  NCM made referral to [facility] with [employer] for neb machine.   Expected Discharge Plan: [place] Barriers to Discharge: Continued Medical Work up   Patient Goals and CMS Choice Patient states their goals for this hospitalization and ongoing recovery are:: return home with wife CMS Medicare.gov Compare Post Acute Care list provided to:: Patient Choice offered to / list presented to : Patient  Expected Discharge Plan and Services Expected Discharge Plan: [place]   Discharge Planning Services: CM Consult Post Acute Care Choice: [place] arrangements for the past 2 months: Single Family Home                   DME Agency: NA       HH Arranged: RN, Disease Management, PT [place] Agency: Rachel ([place]) Date HH Agency Contacted: 08/27/21 Time HH Agency Contacted: 1150 Representative spoke with at [place]: Caryl Pina  Prior Living Arrangements/Services Living arrangements for the past 2 months: [place] Lives with:: Spouse Patient language and need for interpreter reviewed:: Yes Do you feel safe going back to the place where you live?: Yes      Need for Family  Participation in Patient Care: Yes (Comment) Care giver support system in place?: Yes (comment) Current home services: DME (home oxygen- [employer] 2 liters prn,) Criminal Activity/Legal Involvement Pertinent to Current Situation/Hospitalization: No - Comment as needed  Activities of Daily Living Home Assistive Devices/Equipment: Cane (specify quad or straight) ADL Screening (condition at time of admission) Patient's cognitive ability adequate to safely complete daily activities?: Yes Is the patient deaf or have difficulty hearing?: Yes Does the patient have difficulty seeing, even when wearing glasses/contacts?: No Does the patient have difficulty concentrating, remembering, or making decisions?: No Patient able to express need for assistance with ADLs?: Yes Does the patient have difficulty dressing or bathing?: No Independently performs ADLs?: Yes (appropriate for developmental age) Does the patient have difficulty walking or climbing stairs?: Yes Weakness of Legs: Both Weakness of Arms/Hands: None  Permission Sought/Granted                  Emotional Assessment Appearance:: Appears stated age Attitude/Demeanor/Rapport: Engaged Affect (typically observed): Appropriate Orientation: : Oriented to Self, Oriented to Place, Oriented to  Time, Oriented to Situation Alcohol / Substance Use: Not Applicable Psych Involvement: No (comment)  Admission diagnosis:  COPD with acute exacerbation [facility]) [J44.1] Patient Active Problem List   Diagnosis Date Noted   Paroxysmal atrial fibrillation ([place]) 08/25/2021  GERD without esophagitis 08/25/2021   Chronic respiratory failure with hypoxia ([place]) 08/25/2021   Carotid stenosis, right 08/25/2021   Iron deficiency anemia    Positive colorectal cancer screening using Cologuard test    Angiodysplasia of cecum    Elevated troponin level not due myocardial infarction    History of atrial fibrillation 10/09/2020   Constipation 09/07/2020   Acute  gastric ulcer without hemorrhage or perforation    GI bleed 09/05/2020   Atrial flutter ([place]) 09/05/2020   Symptomatic anemia 09/05/2020   Hypokalemia 05/06/2020   Hypoalbuminemia 05/06/2020   Lactic acidosis 05/06/2020   COPD with acute exacerbation ([place]) 05/06/2020   Tobacco abuse 05/06/2020   Urinary retention 03/09/2020   Dyspnea    Hypoxia    COPD GOLD III/ group D still smoking  10/24/2019   PAD (peripheral artery disease) ([place]) 09/13/2019   Obstructive sleep apnea treated with continuous positive airway pressure (CPAP) 05/31/2018   Diabetic neuropathy ([place]) 12/29/2017   Nicotine dependence, cigarettes, uncomplicated 29/93/7169   Mixed diabetic hyperlipidemia associated with type 2 diabetes mellitus ([place]) 12/29/2017   Primary insomnia 12/29/2017   Primary osteoarthritis involving multiple joints 12/29/2017   Hypothyroidism 06/09/2017   Joint pain 08/05/2016   Hyponatremia 08/05/2016   Pulmonary nodule, left 07/16/2016   Acute on chronic diastolic CHF (congestive heart failure) ([place]) 07/09/2016   Type 2 diabetes mellitus with hyperglycemia, without long-term current use of insulin ([place]) 07/09/2016   Non-small cell lung cancer, right ([place]) 04/18/2016   Essential hypertension 02/01/2016   COPD (chronic obstructive pulmonary disease) ([place]) 02/01/2016   BPH with urinary obstruction 02/01/2016   PCP:  [name], FNP Pharmacy:   [id number] - [place], [place] [address] Phone: [phone] Fax: [phone]     Social Determinants of Health (SDOH) Interventions    Readmission Risk Interventions    08/27/2021   11:44 AM 08/21/2020   11:10 AM 06/04/2020    2:06 PM  Readmission Risk Prevention Plan  Transportation Screening Complete Complete Complete  PCP or Specialist Appt within 3-5 Days Complete Complete   HRI or Home Care Consult Complete Complete Complete  Social Work Consult for [place]  Planning/Counseling Complete Complete Complete  Palliative Care Screening Not Applicable Not Applicable Not Applicable  Medication Review Press photographer) Complete Complete Complete

## 2021-08-27 NOTE — Evaluation (Signed)
Physical Therapy Evaluation Patient Details Name: LUCIA MCCREADIE MRN: [id number] DOB: 1945-09-21 Today's Date: 08/27/2021  History of Present Illness  76 y/o male presenting 5/21 with SOB after recent R femoral endarterectomy, R common iliac stenting, R SFA and above the knee popliteal angioplasty, proximal common iliac artery angioplasty.  Has flared COPD, elevated troponin, acute CHF and carotid stenosis on R discovered.  PMHx:  BPH, CHF, COPD, DM, macular degeneration, neuropathy, NSCLC R, PVD, sleep apnea,  Clinical Impression  Pt was seen for mobility after assessment of strength and balance, resulting in tolerance for short walks with maintenance of sats on O2 as well as controlled HR.  Pt is motivated to go home, asking when he is ready to leave.  Told his MD and nurse along with case manager about his request to be ready, along with his performance of all mobility with supervision to min guard assist.  Pt is hopeful to leave today, and have requested HHPT to allow the case manager to ask his previous company with services to return.  Follow acutely for goals as are outlined below.       Recommendations for follow up therapy are one component of a multi-disciplinary discharge planning process, led by the attending physician.  Recommendations may be updated based on patient status, additional functional criteria and insurance authorization.  Follow Up Recommendations Home health PT    Assistance Recommended at Discharge Intermittent Supervision/Assistance  Patient can return home with the following  A little help with walking and/or transfers;A little help with bathing/dressing/bathroom;Assistance with cooking/housework;Assist for transportation;Help with stairs or ramp for entrance    Equipment Recommendations None recommended by PT  Recommendations for Other Services       Functional Status Assessment Patient has had a recent decline in their functional status and demonstrates the  ability to make significant improvements in function in a reasonable and predictable amount of time.     Precautions / Restrictions Precautions Precautions: Fall Precaution Comments: monitor lines Restrictions Weight Bearing Restrictions: No      Mobility  Bed Mobility Overal bed mobility: Modified Independent             General bed mobility comments: extra time to move legs to side of bed    Transfers Overall transfer level: Needs assistance Equipment used: Rolling walker (2 wheels), 1 person hand held assist Transfers: Sit to/from Stand Sit to Stand: Min guard           General transfer comment: min guard for safety    Ambulation/Gait Ambulation/Gait assistance: Min guard Gait Distance (Feet): 60 Feet (30 x 2) Assistive device: Rolling walker (2 wheels), 1 person hand held assist Gait Pattern/deviations: Step-through pattern, Decreased stride length, Wide base of support Gait velocity: reduced Gait velocity interpretation: <1.31 ft/sec, indicative of household ambulator Pre-gait activities: different balance cks General Gait Details: has generally good gait with PT managing catheter and telemetry, O2 line  Stairs            Wheelchair Mobility    Modified Rankin (Stroke Patients Only)       Balance Overall balance assessment: Needs assistance Sitting-balance support: Feet supported Sitting balance-Leahy Scale: Good     Standing balance support: Bilateral upper extremity supported, During functional activity Standing balance-Leahy Scale: Poor Standing balance comment: requires walker support                             Pertinent Vitals/Pain Pain Assessment  Pain Assessment: No/denies pain    Home Living Family/patient expects to be discharged to:: Private residence Living Arrangements: Spouse/significant other Available Help at Discharge: Family;Available PRN/intermittently Type of Home: House Home Access: Stairs to  enter Entrance Stairs-Rails: Psychiatric nurse of Steps: 2   Home Layout: One level Home Equipment: Cane - single point;Rolling Walker (2 wheels);Grab bars - tub/shower;Shower seat;Other (comment) (using O2 baseline) Additional Comments: on O2 at baseline    Prior Function Prior Level of Function : Independent/Modified Independent             Mobility Comments: able to cut his own lawn at baseline ADLs Comments: I for all self care     Hand Dominance   Dominant Hand: Right    Extremity/Trunk Assessment   Upper Extremity Assessment Upper Extremity Assessment: Overall WFL for tasks assessed    Lower Extremity Assessment Lower Extremity Assessment: Overall WFL for tasks assessed    Cervical / Trunk Assessment Cervical / Trunk Assessment: Kyphotic (very mild)  Communication   Communication: HOH  Cognition Arousal/Alertness: Awake/alert Behavior During Therapy: WFL for tasks assessed/performed Overall Cognitive Status: Within Functional Limits for tasks assessed                                 General Comments: good historian        General Comments General comments (skin integrity, edema, etc.): pt has no obvious skin issues, but is generally having LE edema and requires a foley cath again, although he has been without one since March 2023    Exercises     Assessment/Plan    PT Assessment Patient needs continued PT services  PT Problem List Decreased activity tolerance;Decreased balance;Cardiopulmonary status limiting activity       PT Treatment Interventions DME instruction;Gait training;Stair training;Functional mobility training;Therapeutic activities;Therapeutic exercise;Balance training;Neuromuscular re-education;Patient/family education    PT Goals (Current goals can be found in the Care Plan section)  Acute Rehab PT Goals Patient Stated Goal: none stated PT Goal Formulation: With patient/family Time For Goal  Achievement: 09/03/21 Potential to Achieve Goals: Good    Frequency Min 3X/week     Co-evaluation               AM-PAC PT "6 Clicks" Mobility  Outcome Measure Help needed turning from your back to your side while in a flat bed without using bedrails?: None Help needed moving from lying on your back to sitting on the side of a flat bed without using bedrails?: None Help needed moving to and from a bed to a chair (including a wheelchair)?: A Little Help needed standing up from a chair using your arms (e.g., wheelchair or bedside chair)?: A Little Help needed to walk in hospital room?: A Little Help needed climbing 3-5 steps with a railing? : A Lot 6 Click Score: 19    End of Session Equipment Utilized During Treatment: Gait belt;Oxygen Activity Tolerance: Patient tolerated treatment well Patient left: in bed;with call bell/phone within reach;with bed alarm set;with family/visitor present Nurse Communication: Mobility status PT Visit Diagnosis: Unsteadiness on feet (R26.81);Difficulty in walking, not elsewhere classified (R26.2)    Time: 2355-7322 PT Time Calculation (min) (ACUTE ONLY): 30 min   Charges:   PT Evaluation $PT Eval Moderate Complexity: 1 Mod PT Treatments $Gait Training: 8-22 mins       Ramond Dial 08/27/2021, 1:31 PM  Mee Hives, PT PhD Acute Rehab Dept. Number: [facility] [phone] and  [place] [phone]

## 2021-08-27 NOTE — Consult Note (Signed)
   [facility] Inpatient Consult   08/27/2021  John [name] 15-Jan-1946 [id number]  [facility] [facility] [[facility]] Patient: Medicare ACO REACH  Primary Care Provider:  [name], FNP, [place], is an embedded provider with a Chronic Care Management team and program, and is listed for the transition of care follow up and appointments.  Patient had been active with the Embedded CCM team in the past.  Met with the patient and wife at the bedside. Explained the reason for visit.  Patient endorses PCP and follow up.  He denies any current needs however he is being managed for COPD exacerbation currently.  Encouraged to reach back out to the CCM team if his needs changes.  Gave patient's wife the reminder appointment card and 24 hour magnet, verbalized appreciation.  Plan: A referral for post hospital needs can be made. Currently, disposition needs are not determined. Will continue to follow.  Please contact for further questions,  Natividad Brood, RN BSN [facility] Liaison  [phone] business mobile phone Toll free office [phone]  Fax number: [phone] [email or web address]

## 2021-08-27 NOTE — Progress Notes (Signed)
SATURATION QUALIFICATIONS: (This note is used to comply with regulatory documentation for home oxygen)  Patient Saturations on Room Air at Rest = 94%  Patient Saturations on Room Air while Ambulating = 88%  Patient Saturations on 2 Liters of oxygen while Ambulating = 92%

## 2021-08-27 NOTE — Progress Notes (Signed)
Pt placed on CPAP for night rest with 3 lpm oxygen bled in.

## 2021-08-27 NOTE — Discharge Summary (Signed)
Physician Discharge Summary   Patient: John [name] MRN: [id number] DOB: 1945-11-19  Admit date:     08/25/2021  Discharge date: 08/27/21  Discharge Physician: Geradine Girt   PCP: [name], FNP   Recommendations at discharge:    Smoking cessation  Discharge Diagnoses: Principal Problem:   COPD with acute exacerbation [facility]) Active Problems:   Elevated troponin level not due myocardial infarction   Acute on chronic diastolic CHF (congestive heart failure) (HCC)   Carotid stenosis, right   Type 2 diabetes mellitus with hyperglycemia, without long-term current use of insulin (HCC)   Chronic respiratory failure with hypoxia (HCC)   Mixed diabetic hyperlipidemia associated with type 2 diabetes mellitus (HCC)   Paroxysmal atrial fibrillation (HCC)   Non-small cell lung cancer, right (HCC)   GERD without esophagitis   Nicotine dependence, cigarettes, uncomplicated    Hospital Course: 76 year old male with past medical history of non-small cell lung cancer Stage IIIa (S/P chemo and radiation therapy, follows with Dr. 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  Assessment and Plan: * COPD with acute exacerbation ([place]) Exam consistent with COPD exacerbation with notable wheezing and prolonged expiratory phase, likely with some degree of superimposed volume overload. Chest x-ray reveals no evidence of  concurrent pneumonia IV steroids to PO steroids as much improved short course of IV Furosemde 30m BID Supplemental oxygen initiated with target oxygen saturations of between 89 to 92% additionally providing CPAP with sleep (hx OS)    Elevated troponin level not due myocardial infarction Slightly elevated serial troponins with flat trajectory of elevation Patient is chest pain-free Likely secondary to underlying illness, plaque rupture is unlikely   Acute on chronic diastolic CHF (congestive heart failure) (HCC) Patient reports increasing abdominal girth and weight gain in the past few days with PND Physical exam reveals distended abdomen with elevated JVP BNP slightly higher than baseline without obvious pulmonary edema on CXR Providing patient with short course of 630mIV Lasix BID Strict input and output monitoring Daily weights Low-sodium diet  Carotid stenosis, right Incidental finding on CT neck U/s < 40%  Type 2 diabetes mellitus with hyperglycemia, without long-term current use of insulin (HCC) Resume home meds   Chronic respiratory failure with hypoxia ([place]Patient typically requires supplemental oxygen with exertion No clinical evidence of acute on chronic respiratory failure at this time   Mixed diabetic hyperlipidemia associated with type 2 diabetes mellitus ([facility] regimen of lipid lowering therapy.   Paroxysmal atrial fibrillation (HCC) Currently rate controlled. Continue home meds   Non-small cell lung cancer, right ([place]-up with Dr. KaDelton Coombes GERD without esophagitis Continuing home regimen of daily PPI therapy.   Nicotine dependence, cigarettes, uncomplicated Patient is being counseled daily on smoking cessation. Providing patient with nicotine replacement therapy during this hospitalization.    Nutrition Status: Nutrition Problem: Increased nutrient needs Etiology: chronic illness (CHF,  COPD) Signs/Symptoms: estimated needs Interventions: Glucerna shake    Diet recommendation:  Discharge Diet Orders (From admission, onward)     Start  Ordered   08/27/21 0000  Diet - low sodium heart healthy        08/27/21 1208   08/27/21 0000  Diet Carb Modified        08/27/21 1208           Cardiac and Carb modified diet DISCHARGE MEDICATION: Allergies as of 08/27/2021       Reactions   Sulfa Antibiotics Swelling   Mouth swelling   Atorvastatin Other (See Comments)   Muscle aches - tolerating Pravastatin 40 mg MWF   Jardiance [empagliflozin] Other (See Comments)   FEELS SLUGGISH, TIRED   Lopressor [metoprolol] Other (See Comments)   Fatigue   Rosuvastatin Other (See Comments)   Muscle aches - tolerating Pravastatin 40 mg MWF   Doxycycline Nausea Only   Temazepam Other (See Comments)   Made insomnia worse         Medication List     TAKE these medications    acetaminophen 650 MG CR tablet Commonly known as: TYLENOL Take 1,300 mg by mouth every 8 (eight) hours as needed for pain.   albuterol 108 (90 Base) MCG/ACT inhaler Commonly known as: VENTOLIN HFA Inhale 2 puffs into the lungs every 4 (four) hours as needed for wheezing or shortness of breath. What changed: Another medication with the same name was added. Make sure you understand how and when to take each.   albuterol (2.5 MG/3ML) 0.083% nebulizer solution Commonly known as: PROVENTIL Take 3 mLs (2.5 mg total) by nebulization every 4 (four) hours as needed for wheezing or shortness of breath (if not able to take inhaler). What changed: You were already taking a medication with the same name, and this prescription was added. Make sure you understand how and when to take each.   aspirin EC 81 MG tablet Take 81 mg by mouth daily. Swallow whole. Notes to patient: Take tomorrow morning 08/28/21   B-12 PO Take 1 tablet by mouth daily.   Eliquis 5 MG Tabs tablet Generic drug: apixaban TAKE 1  TABLET BY MOUTH TWICE A DAY What changed: how much to take Notes to patient: Take tonight @ bedtime 08/27/21   fluticasone 50 MCG/ACT nasal spray Commonly known as: FLONASE PLACE 1 SPRAY INTO BOTH NOSTRILS DAILY AS NEEDED FOR ALLERGIES OR RHINITIS.   gabapentin 300 MG capsule Commonly known as: NEURONTIN TAKE 1 CAPSULE BY MOUTH THREE TIMES A DAY What changed: See the new instructions.   hydrOXYzine 25 MG capsule Commonly known as: Vistaril Take 1 capsule (25 mg total) by mouth 3 (three) times daily as needed for itching.   Iron (Ferrous Sulfate) 325 (65 Fe) MG Tabs Take 325 mg by mouth 2 (two) times daily. What changed: when to take this Notes to patient: Take tonight @ bedtime 08/27/21   Klor-Con M20 20 MEQ tablet Generic drug: potassium chloride SA TAKE 1 TABLET BY MOUTH EVERY DAY What changed: how much to take   levocetirizine 5 MG tablet Commonly known as: XYZAL Take 1 tablet (5 mg total) by mouth at bedtime. What changed:  when to take this reasons to take this   levothyroxine 50 MCG tablet Commonly known as: SYNTHROID TAKE 1 TABLET BY MOUTH EVERY DAY What changed: when to take this Notes to patient: Take tomorrow morning 08/28/21   linaclotide 145 MCG Caps capsule Commonly known as: Linzess Take 1 capsule (145 mcg total) by mouth daily. To regulate bowel movements What changed:  when to take this reasons to take this   Melatonin  10 MG Tabs Take 10 mg by mouth at bedtime as needed (sleep). Notes to patient: Take tonight @ bedtime 08/27/21   nebivolol 2.5 MG tablet Commonly known as: BYSTOLIC TAKE 1 TABLET BY MOUTH EVERY DAY Notes to patient: Take tomorrow morning 08/28/21   ondansetron 4 MG tablet Commonly known as: Zofran Take 1 tablet (4 mg total) by mouth every 8 (eight) hours as needed for nausea or vomiting.   OneTouch Delica Lancets 54Y Misc Use to test blood sugars daily as directed. DX: E11.9   OneTouch Verio Reflect w/Device Kit Use to test  blood sugars daily as directed. DX: E11.9   OneTouch Verio test strip Generic drug: glucose blood USE TO TEST BLOOD SUGARS DAILY AS DIRECTED. DX: E11.9   pantoprazole 40 MG tablet Commonly known as: Protonix Take 1 tablet (40 mg total) by mouth daily. Notes to patient: Take tomorrow morning 08/28/21   pravastatin 40 MG tablet Commonly known as: PRAVACHOL TAKE 1 TABLET (40 MG TOTAL) BY MOUTH ON MONDAYS , WEDNESDAYS, AND FRIDAYS AS DIRECTED What changed: See the new instructions. Notes to patient: Take tomorrow morning 08/28/21   predniSONE 20 MG tablet Commonly known as: DELTASONE Take 2 tablets (40 mg total) by mouth daily with breakfast for 4 days. Start taking on: Aug 28, 2021 Notes to patient: Take tomorrow morning 08/28/21   Rybelsus 7 MG Tabs Generic drug: Semaglutide Take 7 mg by mouth daily.   torsemide 20 MG tablet Commonly known as: DEMADEX TAKE 2 TABLETS (40 MG) DAILY-CAN TAKE ADDITIONAL 2 TABS IN AFTERNOON FOR WEIGHT GAIN 3 LBS IN 1 DAY OR 5 LBS IN 1 WEEK, OR WORSENING LOWER EXTREMITY EDEMA OR SHORTNESS OF BREATH What changed:  how much to take how to take this when to take this   traZODone 50 MG tablet Commonly known as: DESYREL TAKE 1-2 TABLETS BY MOUTH AT BEDTIME AS NEEDED FOR SLEEP. What changed:  reasons to take this additional instructions   Trelegy Ellipta 100-62.5-25 MCG/ACT Aepb Generic drug: Fluticasone-Umeclidin-Vilant Inhale 1 puff into the lungs daily.               Durable Medical Equipment  (From admission, onward)           Start     Ordered   08/27/21 0857  For home use only DME Nebulizer machine  Once       Question Answer Comment  Patient needs a nebulizer to treat with the following condition COPD (chronic obstructive pulmonary disease) ([place])   Length of Need Lifetime      08/27/21 0856            Follow-up Information     [facility] Follow up.   Why: HHRN,HHPT- Agency will contact with apt times  [place], [name], [place]. Go on 09/03/2021.   Specialty: Family Medicine Why: _0 :[place] information: [place] [address] [address], Cassie Freer, MD .   Specialties: Cardiology, Internal Medicine, Radiology Contact information: [place] [address] [phone]         [facility] Follow up.   Why: neb machine Contact information: [place] [address] [phone]                Discharge Exam: Filed Weights   08/26/21 0224 08/26/21 0429 08/27/21 0407  Weight: 74.4 kg 73.4 kg  74.2 kg     Condition at discharge: good  The results of significant diagnostics from this hospitalization (including imaging, microbiology, ancillary and laboratory) are listed below for reference.   Imaging Studies: DG Chest 2 View  Result Date: 08/25/2021 CLINICAL DATA:  Shortness of breath EXAM: CHEST - 2 VIEW COMPARISON:  08/12/2021 FINDINGS: Left subclavian approach chest port remains in place. Stable heart size. Aortic atherosclerosis. Radiation changes within the bilateral upper lobes. No evidence of a new airspace opacity. No pleural effusion or pneumothorax. IMPRESSION: No active cardiopulmonary disease. Electronically Signed   By: Davina Poke D.O.   On: 08/25/2021 17:45   DG Chest 2 View  Result Date: 08/12/2021 CLINICAL DATA:  Cough. EXAM: CHEST - 2 VIEW COMPARISON:  08/19/2021 FINDINGS: There is a left chest wall port a catheter with tip in the distal SVC. Stable cardiomediastinal contours. Aortic atherosclerotic calcifications noted. There is biapical scarring with architectural distortion corresponding to areas of radiation change. No pleural effusion or edema. No acute airspace consolidation. IMPRESSION: No acute cardiopulmonary abnormalities. Biapical scarring and architectural distortion corresponding to changes of external beam radiation within the upper lung  zones. Electronically Signed   By: Kerby Moors M.D.   On: 08/12/2021 09:52   CT Head Wo Contrast  Result Date: 08/25/2021 CLINICAL DATA:  Head trauma, moderate-severe fall on thinners; Neck trauma (Age >= 65y) EXAM: CT HEAD WITHOUT CONTRAST CT CERVICAL SPINE WITHOUT CONTRAST TECHNIQUE: Multidetector CT imaging of the head and cervical spine was performed following the standard protocol without intravenous contrast. Multiplanar CT image reconstructions of the cervical spine were also generated. RADIATION DOSE REDUCTION: This exam was performed according to the departmental dose-optimization program which includes automated exposure control, adjustment of the mA and/or kV according to patient size and/or use of iterative reconstruction technique. COMPARISON:  11/30/2020, 06/19/2021 FINDINGS: CT HEAD FINDINGS Brain: No evidence of acute infarction, hemorrhage, hydrocephalus, extra-axial collection or mass lesion/mass effect. Patchy low-density changes within the periventricular and subcortical white matter compatible with chronic microvascular ischemic change. Mild diffuse cerebral volume loss. Vascular: Atherosclerotic calcifications involving the large vessels of the skull base. No unexpected hyperdense vessel. Skull: Normal. Negative for fracture or focal lesion. Sinuses/Orbits: No acute finding. Other: Negative for scalp hematoma. CT CERVICAL SPINE FINDINGS Alignment: Facet joints are aligned without dislocation or traumatic listhesis. Dens and lateral masses are aligned. Skull base and vertebrae: No acute fracture. No primary bone lesion or focal pathologic process. Soft tissues and spinal canal: No prevertebral fluid or swelling. No visible canal hematoma. Disc levels: Degenerative disc disease is most pronounced at C5-6 and C6-7. Multilevel bilateral facet arthropathy is worse on the left. Upper chest: Partially visualized radiation changes within the included bilateral lung apices, similar in appearance  to chest CT 06/19/2021. Other: Bilateral carotid atherosclerosis with suspected high-grade stenosis on the right (series 5, image 52). IMPRESSION: 1. No acute intracranial abnormality. 2. No acute cervical spine fracture or subluxation. 3. Chronic microvascular ischemic change and cerebral volume loss. 4. Bilateral carotid atherosclerosis with suspected high-grade stenosis on the right. Further evaluation with carotid ultrasound is recommended. Electronically Signed   By: Davina Poke D.O.   On: 08/25/2021 17:51   CT Cervical Spine Wo Contrast  Result Date: 08/25/2021 CLINICAL DATA:  Head trauma, moderate-severe fall on thinners; Neck trauma (Age >= 65y) EXAM: CT HEAD WITHOUT CONTRAST CT CERVICAL SPINE WITHOUT CONTRAST TECHNIQUE: Multidetector CT imaging of the head and cervical spine was performed following the standard protocol without intravenous contrast. Multiplanar  CT image reconstructions of the cervical spine were also generated. RADIATION DOSE REDUCTION: This exam was performed according to the departmental dose-optimization program which includes automated exposure control, adjustment of the mA and/or kV according to patient size and/or use of iterative reconstruction technique. COMPARISON:  11/30/2020, 06/19/2021 FINDINGS: CT HEAD FINDINGS Brain: No evidence of acute infarction, hemorrhage, hydrocephalus, extra-axial collection or mass lesion/mass effect. Patchy low-density changes within the periventricular and subcortical white matter compatible with chronic microvascular ischemic change. Mild diffuse cerebral volume loss. Vascular: Atherosclerotic calcifications involving the large vessels of the skull base. No unexpected hyperdense vessel. Skull: Normal. Negative for fracture or focal lesion. Sinuses/Orbits: No acute finding. Other: Negative for scalp hematoma. CT CERVICAL SPINE FINDINGS Alignment: Facet joints are aligned without dislocation or traumatic listhesis. Dens and lateral masses are  aligned. Skull base and vertebrae: No acute fracture. No primary bone lesion or focal pathologic process. Soft tissues and spinal canal: No prevertebral fluid or swelling. No visible canal hematoma. Disc levels: Degenerative disc disease is most pronounced at C5-6 and C6-7. Multilevel bilateral facet arthropathy is worse on the left. Upper chest: Partially visualized radiation changes within the included bilateral lung apices, similar in appearance to chest CT 06/19/2021. Other: Bilateral carotid atherosclerosis with suspected high-grade stenosis on the right (series 5, image 52). IMPRESSION: 1. No acute intracranial abnormality. 2. No acute cervical spine fracture or subluxation. 3. Chronic microvascular ischemic change and cerebral volume loss. 4. Bilateral carotid atherosclerosis with suspected high-grade stenosis on the right. Further evaluation with carotid ultrasound is recommended. Electronically Signed   By: Davina Poke D.O.   On: 08/25/2021 17:51   VAS US CAROTID  Result Date: 08/26/2021 Carotid Arterial Duplex Study Patient Name:  John [name]  Date of Exam:   08/26/2021 Medical Rec #: [id number]         Accession #:    [id number] Date of Birth: [birth year]         Patient Gender: M Patient Age:   22 years Exam Location:  [facility] Procedure:      VAS US CAROTID Referring Phys: Inda Merlin --------------------------------------------------------------------------------  Indications:       CVA. Risk Factors:      Hypertension, hyperlipidemia, Diabetes, current smoker. Comparison Study:  No prior study Performing Technologist: Maudry Mayhew MHA, RDMS, RVT, RDCS  Examination Guidelines: A complete evaluation includes B-mode imaging, spectral Doppler, color Doppler, and power Doppler as needed of all accessible portions of each vessel. Bilateral testing is considered an integral part of a complete examination. Limited examinations for reoccurring indications may be performed as  noted.  Right Carotid Findings: +----------+--------+--------+--------+------------------------------+---------+           PSV cm/sEDV cm/sStenosisPlaque Description            Comments  +----------+--------+--------+--------+------------------------------+---------+ CCA Prox  89      14              irregular and heterogenous              +----------+--------+--------+--------+------------------------------+---------+ CCA Distal63      14              heterogenous and calcific     Shadowing +----------+--------+--------+--------+------------------------------+---------+ ICA Prox  183     35              irregular, heterogenous and   Shadowing  calcific                                +----------+--------+--------+--------+------------------------------+---------+ ICA Distal61      16                                                      +----------+--------+--------+--------+------------------------------+---------+ ECA       129     8               irregular, heterogenous and                                               calcific                                +----------+--------+--------+--------+------------------------------+---------+ +----------+--------+-------+----------------+-------------------+           PSV cm/sEDV cmsDescribe        Arm Pressure (mmHG) +----------+--------+-------+----------------+-------------------+ Subclavian120            Multiphasic, WNL                    +----------+--------+-------+----------------+-------------------+ +---------+--------+--+--------+--+---------+ VertebralPSV cm/s60EDV cm/s14Antegrade +---------+--------+--+--------+--+---------+  Left Carotid Findings: +----------+--------+--------+--------+------------------------------+---------+           PSV cm/sEDV cm/sStenosisPlaque Description            Comments   +----------+--------+--------+--------+------------------------------+---------+ CCA Prox  106     21              smooth and heterogenous                 +----------+--------+--------+--------+------------------------------+---------+ CCA Distal88      19              heterogenous and irregular              +----------+--------+--------+--------+------------------------------+---------+ ICA Prox  97      23              heterogenous, irregular and   Shadowing                                   calcific                                +----------+--------+--------+--------+------------------------------+---------+ ICA Distal82      23                                                      +----------+--------+--------+--------+------------------------------+---------+ ECA       159     17              focal and heterogenous                  +----------+--------+--------+--------+------------------------------+---------+ +----------+--------+--------+----------------+-------------------+           PSV cm/sEDV  cm/sDescribe        Arm Pressure (mmHG) +----------+--------+--------+----------------+-------------------+ [id number]             Multiphasic, WNL                    +----------+--------+--------+----------------+-------------------+ +---------+--------+--+--------+-+---------+ VertebralPSV cm/s41EDV cm/s9Antegrade +---------+--------+--+--------+-+---------+   Summary: Right Carotid: Velocities in the right ICA are consistent with an upper range                1-39% stenosis. Left Carotid: Velocities in the left ICA are consistent with a 1-39% stenosis. Vertebrals:  Bilateral vertebral arteries demonstrate antegrade flow. Subclavians: Normal flow hemodynamics were seen in bilateral subclavian              arteries. *See table(s) above for measurements and observations.  Electronically signed by Monica Martinez MD on 08/26/2021 at 5:19:26 PM.    Final      Microbiology: Results for orders placed or performed during the hospital encounter of 08/25/21  Resp Panel by RT-PCR (Flu A&B, Covid) Nasopharyngeal Swab     Status: None   Collection Time: 08/26/21  5:57 PM   Specimen: Nasopharyngeal Swab; Nasopharyngeal(NP) swabs in vial transport medium  Result Value Ref Range Status   SARS Coronavirus 2 by RT PCR NEGATIVE NEGATIVE Final    Comment: (NOTE) SARS-CoV-2 target nucleic acids are NOT DETECTED.  The SARS-CoV-2 RNA is generally detectable in upper respiratory specimens during the acute phase of infection. The lowest concentration of SARS-CoV-2 viral copies this assay can detect is 138 copies/mL. A negative result does not preclude SARS-Cov-2 infection and should not be used as the sole basis for treatment or other patient management decisions. A negative result may occur with  improper specimen collection/handling, submission of specimen other than nasopharyngeal swab, presence of viral mutation(s) within the areas targeted by this assay, and inadequate number of viral copies(<138 copies/mL). A negative result must be combined with clinical observations, patient history, and epidemiological information. The expected result is Negative.  Fact Sheet for Patients:  EntrepreneurPulse.com.au  Fact Sheet for Healthcare Providers:  IncredibleEmployment.be  This test is no t yet approved or cleared by the [place] FDA and  has been authorized for detection and/or diagnosis of SARS-CoV-2 by FDA under an Emergency Use Authorization (EUA). This EUA will remain  in effect (meaning this test can be used) for the duration of the COVID-19 declaration under Section 564(b)(1) of the Act, 21 U.S.C.section 360bbb-3(b)(1), unless the authorization is terminated  or revoked sooner.       Influenza A by PCR NEGATIVE NEGATIVE Final   Influenza B by PCR NEGATIVE NEGATIVE Final    Comment: (NOTE) The Xpert  Xpress SARS-CoV-2/FLU/RSV plus assay is intended as an aid in the diagnosis of influenza from Nasopharyngeal swab specimens and should not be used as a sole basis for treatment. Nasal washings and aspirates are unacceptable for Xpert Xpress SARS-CoV-2/FLU/RSV testing.  Fact Sheet for Patients: EntrepreneurPulse.com.au  Fact Sheet for Healthcare Providers: IncredibleEmployment.be  This test is not yet approved or cleared by the [place] FDA and has been authorized for detection and/or diagnosis of SARS-CoV-2 by FDA under an Emergency Use Authorization (EUA). This EUA will remain in effect (meaning this test can be used) for the duration of the COVID-19 declaration under Section 564(b)(1) of the Act, 21 U.S.C. section 360bbb-3(b)(1), unless the authorization is terminated or revoked.  Performed at [facility], [place] [address] [place], [place] [address]     Labs:  CBC: Recent Labs  Lab 08/23/21 1129 08/25/21 1716 08/25/21 2325  WBC 8.2 7.4 8.0  NEUTROABS 6.7 5.7 7.6  HGB 10.4* 10.1* 9.8*  HCT 32.4* 33.3* 31.5*  MCV 108* 115.2* 112.9*  PLT 302 277 622   Basic Metabolic Panel: Recent Labs  Lab 08/23/21 1129 08/25/21 1716 08/25/21 2325 08/27/21 0910  NA 140 139 137 139  K 4.6 3.7 3.6 3.7  CL 98 102 101 98  CO2 _0 GLUCOSE 161* 127* 175* 223*  BUN _1 34*  CREATININE 1.31* 1.20 1.09 1.21  CALCIUM 9.4 8.8* 8.6* 9.5  MG  --   --  1.9  --    Liver Function Tests: Recent Labs  Lab 08/23/21 1129 08/25/21 2325  AST 16 13*  ALT 11 12  ALKPHOS 123* 89  BILITOT 0.3 0.8  PROT 6.8 6.4*  ALBUMIN 4.2 3.2*   CBG: Recent Labs  Lab 08/26/21 1138 08/26/21 1740 08/26/21 2059 08/27/21 0653 08/27/21 1130  GLUCAP 286* 223* 281* 285* 195*    Discharge time spent: greater than 30 minutes.  Signed: Geradine Girt, DO [facility] 08/27/2021

## 2021-08-27 NOTE — Progress Notes (Signed)
Mobility Specialist Progress Note:   08/27/21 1200  Mobility  Activity Ambulated with assistance in hallway  Level of Assistance Standby assist, set-up cues, supervision of patient - no hands on  Assistive Device Front wheel walker  Distance Ambulated (ft) 120 ft  Activity Response Tolerated well  $Mobility charge 1 Mobility   Pt received in bed willing to participate in mobility. No complaints of pain. Left EOB with call bell in reach and all needs met.   [place] [facility] Mobility Specialist Primary Phone [phone]  

## 2021-08-27 NOTE — Progress Notes (Signed)
Pt A/O x 4. OOB w/ assistance. Admitted for COPD w/ acute exacerbation. See note. Pt 02 resting on RA >90%. Pt 02 sats started to drop below 90% per mobility tech when ambulating but pt was able to recover @ rest. Pt encouraged to used 02 when ambulating only uses 02 as needed @ home. Pt educated on follow up care and medication.

## 2021-08-28 ENCOUNTER — Telehealth: Payer: Self-pay | Admitting: Family

## 2021-08-28 DIAGNOSIS — E1151 Type 2 diabetes mellitus with diabetic peripheral angiopathy without gangrene: Secondary | ICD-10-CM | POA: Diagnosis not present

## 2021-08-28 DIAGNOSIS — E1140 Type 2 diabetes mellitus with diabetic neuropathy, unspecified: Secondary | ICD-10-CM | POA: Diagnosis not present

## 2021-08-28 DIAGNOSIS — T451X5D Adverse effect of antineoplastic and immunosuppressive drugs, subsequent encounter: Secondary | ICD-10-CM | POA: Diagnosis not present

## 2021-08-28 DIAGNOSIS — I110 Hypertensive heart disease with heart failure: Secondary | ICD-10-CM | POA: Diagnosis not present

## 2021-08-28 DIAGNOSIS — I5033 Acute on chronic diastolic (congestive) heart failure: Secondary | ICD-10-CM | POA: Diagnosis not present

## 2021-08-28 DIAGNOSIS — K219 Gastro-esophageal reflux disease without esophagitis: Secondary | ICD-10-CM | POA: Diagnosis not present

## 2021-08-28 DIAGNOSIS — E1169 Type 2 diabetes mellitus with other specified complication: Secondary | ICD-10-CM | POA: Diagnosis not present

## 2021-08-28 DIAGNOSIS — G4733 Obstructive sleep apnea (adult) (pediatric): Secondary | ICD-10-CM | POA: Diagnosis not present

## 2021-08-28 DIAGNOSIS — F17210 Nicotine dependence, cigarettes, uncomplicated: Secondary | ICD-10-CM | POA: Diagnosis not present

## 2021-08-28 DIAGNOSIS — D630 Anemia in neoplastic disease: Secondary | ICD-10-CM | POA: Diagnosis not present

## 2021-08-28 DIAGNOSIS — H3530 Unspecified macular degeneration: Secondary | ICD-10-CM | POA: Diagnosis not present

## 2021-08-28 DIAGNOSIS — N401 Enlarged prostate with lower urinary tract symptoms: Secondary | ICD-10-CM | POA: Diagnosis not present

## 2021-08-28 DIAGNOSIS — J441 Chronic obstructive pulmonary disease with (acute) exacerbation: Secondary | ICD-10-CM | POA: Diagnosis not present

## 2021-08-28 DIAGNOSIS — J9611 Chronic respiratory failure with hypoxia: Secondary | ICD-10-CM | POA: Diagnosis not present

## 2021-08-28 DIAGNOSIS — F419 Anxiety disorder, unspecified: Secondary | ICD-10-CM | POA: Diagnosis not present

## 2021-08-28 DIAGNOSIS — I480 Paroxysmal atrial fibrillation: Secondary | ICD-10-CM | POA: Diagnosis not present

## 2021-08-28 DIAGNOSIS — Z9181 History of falling: Secondary | ICD-10-CM | POA: Diagnosis not present

## 2021-08-28 DIAGNOSIS — Z7982 Long term (current) use of aspirin: Secondary | ICD-10-CM | POA: Diagnosis not present

## 2021-08-28 DIAGNOSIS — R338 Other retention of urine: Secondary | ICD-10-CM | POA: Diagnosis not present

## 2021-08-28 DIAGNOSIS — C3491 Malignant neoplasm of unspecified part of right bronchus or lung: Secondary | ICD-10-CM | POA: Diagnosis not present

## 2021-08-28 DIAGNOSIS — E1165 Type 2 diabetes mellitus with hyperglycemia: Secondary | ICD-10-CM | POA: Diagnosis not present

## 2021-08-28 DIAGNOSIS — E032 Hypothyroidism due to medicaments and other exogenous substances: Secondary | ICD-10-CM | POA: Diagnosis not present

## 2021-08-28 DIAGNOSIS — F32A Depression, unspecified: Secondary | ICD-10-CM | POA: Diagnosis not present

## 2021-08-28 DIAGNOSIS — E782 Mixed hyperlipidemia: Secondary | ICD-10-CM | POA: Diagnosis not present

## 2021-08-28 DIAGNOSIS — M1990 Unspecified osteoarthritis, unspecified site: Secondary | ICD-10-CM | POA: Diagnosis not present

## 2021-08-28 NOTE — Telephone Encounter (Signed)
Pt was able to urinate around 2:30 but still concerned that he is retaining too much fluid. Hospital follow up appt moved up to tomorrow to evaluate.

## 2021-08-28 NOTE — Telephone Encounter (Signed)
Patient released from hospital yesterday.  Has appt with [place] for HFU on 5/30.  Has a question today about the medication he is taking for his fluid buildup.   Medication:  torsemide 20mg  - takes 2 a day Does not seem to be helping.  Has not went to the bathroom as of 10:00 this morning. What should he do? Please call.

## 2021-08-28 NOTE — Telephone Encounter (Signed)
Left detailed message on patients voicemail with lab results from 5/19. Advised to contact office back if any further questions.

## 2021-08-29 ENCOUNTER — Ambulatory Visit (INDEPENDENT_AMBULATORY_CARE_PROVIDER_SITE_OTHER): Payer: Medicare Other | Admitting: Family

## 2021-08-29 ENCOUNTER — Encounter: Payer: Self-pay | Admitting: Family

## 2021-08-29 VITALS — BP 137/75 | HR 90 | Temp 97.5°F | Wt 167.8 lb

## 2021-08-29 DIAGNOSIS — C3491 Malignant neoplasm of unspecified part of right bronchus or lung: Secondary | ICD-10-CM

## 2021-08-29 DIAGNOSIS — I5033 Acute on chronic diastolic (congestive) heart failure: Secondary | ICD-10-CM | POA: Diagnosis not present

## 2021-08-29 DIAGNOSIS — J9611 Chronic respiratory failure with hypoxia: Secondary | ICD-10-CM

## 2021-08-29 DIAGNOSIS — I110 Hypertensive heart disease with heart failure: Secondary | ICD-10-CM | POA: Diagnosis not present

## 2021-08-29 DIAGNOSIS — J441 Chronic obstructive pulmonary disease with (acute) exacerbation: Secondary | ICD-10-CM

## 2021-08-29 DIAGNOSIS — Z09 Encounter for follow-up examination after completed treatment for conditions other than malignant neoplasm: Secondary | ICD-10-CM | POA: Diagnosis not present

## 2021-08-29 DIAGNOSIS — F17200 Nicotine dependence, unspecified, uncomplicated: Secondary | ICD-10-CM

## 2021-08-29 DIAGNOSIS — E1165 Type 2 diabetes mellitus with hyperglycemia: Secondary | ICD-10-CM | POA: Diagnosis not present

## 2021-08-29 NOTE — Patient Instructions (Signed)
Chronic Obstructive Pulmonary Disease Exacerbation  Chronic obstructive pulmonary disease (COPD) is a long-term (chronic) lung problem. In COPD, the flow of air from the lungs is limited. COPD exacerbations are times that breathing gets worse and you need more than your normal treatment. Without treatment, they can be life-threatening. If they happen often, your lungs can become more damaged. What are the causes? Having infections that affect your airways and lungs. Being exposed to: Smoke. Air pollution. Chemical fumes. Dust. Things that can cause an allergic reaction (allergens). Not taking your usual COPD medicines as told. Having medical problems already, such as heart failure or infections not involving the lungs. In many cases, the cause is not known. What increases the risk? Smoking. Being an older adult. Having frequent prior COPD exacerbations. What are the signs or symptoms? Increased coughing. Increased mucus from your lungs. Increased wheezing. Increased shortness of breath. Fast breathing and finding it hard to breathe. Chest tightness. Less energy than usual. Sleep disruption from symptoms. Confusion. Increased sleepiness. Often, these symptoms happen or get worse even with the use of medicines. How is this treated? Treatment for this condition depends on how bad it is and the cause of the symptoms. You may need to stay in the hospital for treatment. Treatment may include: Taking medicines. Using oxygen. Being treated with different ways to clear your airway, such as using a mask to deliver oxygen. Follow these instructions at home: Medicines Take over-the-counter and prescription medicines only as told by your doctor. Use all inhaled medicines the correct way. If you were prescribed an antibiotic or steroid medicine, take it as told by your doctor. Do not stop taking it even if you start to feel better. Lifestyle Do not smoke or use any products that contain  nicotine or tobacco. If you need help quitting, ask your doctor. Eat healthy foods. Exercise regularly. Get enough sleep. Most adults need 7 or more hours per night. Avoid tobacco smoke and other things that can bother your lungs. Several times a day, wash your hands with soap and water for at least 20 seconds. If you cannot use soap and water, use hand sanitizer. This may help keep you from getting an infection. During flu season, avoid areas that are crowded with people. General instructions Drink enough fluid to keep your pee (urine) pale yellow. Do not do this if your doctor has told you not to. Use a cool mist machine (vaporizer). If you use oxygen or a machine that turns medicine into a mist (nebulizer), continue to use it as told. Keep all follow-up visits. How is this prevented? Keep up with shots (vaccinations) as told by your doctor. Be sure to get a yearly flu (influenza) shot. If you smoke, quit smoking. Smoking makes the problem worse. Follow all instructions for rehabilitation. These are steps you can take to make your body work better. Work with your doctor to develop and follow an action plan. This tells you what steps to take when you experience certain symptoms. Contact a doctor if: Your COPD symptoms get worse than normal. Get help right away if: You are short of breath and it gets worse, even when you are resting. You have trouble talking. You have chest pain. You cough up blood. You have a fever. You keep vomiting. You feel weak or you pass out (faint). You feel confused. You are not able to sleep because of your symptoms. You have trouble doing daily activities. These symptoms may be an emergency. Get help right away.  Call your local emergency services (911 in the [place] Do not wait to see if the symptoms will go away. Do not drive yourself to the hospital. Summary COPD exacerbations are times that breathing gets worse and you need more treatment than  normal. COPD exacerbations can be very serious and may cause your lungs to become more damaged. Do not smoke. If you need help quitting, ask your doctor. Stay up to date on your shots. Get a flu shot every year. This information is not intended to replace advice given to you by your health care provider. Make sure you discuss any questions you have with your health care provider. Document Revised: 02/15/2020 Document Reviewed: 01/31/2020 [employer].

## 2021-08-29 NOTE — Progress Notes (Signed)
 Subjective:    Patient ID: John [name], male    DOB: 11/18/1945, 76 y.o.   MRN: [id number]  Chief Complaint  Patient presents with   Hospital Follwup   Pt presents to the office today for hospital follow up. He went to the ED on 08/25/21 with SOB. He has hx of COPD, non-small cell lung cancer, and CHF.   He was given IV steroids and transition to PO steroids. Given IV lasix of 60 mg BID. He was given supplemental oxygen with target range of 89-92 %. He wears his oxygen at home all the time, but given his generalized weakness he can not carry the portable oxygen. His O2 is 92 % on room air sitting.   He was discharged on prednisone 40 mg and currently taking Demadex 20 mg daily.    Shortness of Breath This is a chronic problem. The current episode started more than 1 year ago. The problem has been waxing and waning. Associated symptoms include sputum production. Pertinent negatives include no chest pain, ear pain, fever or leg swelling. Risk factors include smoking. His past medical history is significant for CAD, chronic lung disease and COPD.  Nicotine Dependence Presents for follow-up visit. His urge triggers include company of smokers. The symptoms have been stable. He smokes < 1/2 a pack of cigarettes per day.     Review of Systems  Constitutional:  Negative for fever.  HENT:  Negative for ear pain.   Respiratory:  Positive for sputum production and shortness of breath.   Cardiovascular:  Negative for chest pain and leg swelling.  All other systems reviewed and are negative.     Objective:   Physical Exam Vitals reviewed.  Constitutional:      General: He is not in acute distress.    Appearance: He is well-developed. He is obese.  HENT:     Head: Normocephalic.     Right Ear: Tympanic membrane normal.     Left Ear: Tympanic membrane normal.  Eyes:     General:        Right eye: No discharge.        Left eye: No discharge.     Pupils: Pupils are equal, round, and  reactive to light.  Neck:     Thyroid: No thyromegaly.  Cardiovascular:     Rate and Rhythm: Normal rate and regular rhythm.     Heart sounds: Normal heart sounds. No murmur heard. Pulmonary:     Effort: Pulmonary effort is normal. No respiratory distress.     Breath sounds: Wheezing present.  Abdominal:     General: Bowel sounds are normal. There is no distension.     Palpations: Abdomen is soft.     Tenderness: There is no abdominal tenderness.  Musculoskeletal:        General: No tenderness. Normal range of motion.     Cervical back: Normal range of motion and neck supple.  Skin:    General: Skin is warm and dry.     Findings: No erythema or rash.  Neurological:     Mental Status: He is alert and oriented to person, place, and time.     Cranial Nerves: No cranial nerve deficit.     Motor: Weakness present.     Deep Tendon Reflexes: Reflexes are normal and symmetric.  Psychiatric:        Behavior: Behavior normal.        Thought Content: Thought content normal.          Judgment: Judgment normal.     BP 137/75   Pulse 90   Temp (!) 97.5 F (36.4 C)   Wt 167 lb 12.8 oz (76.1 kg)   SpO2 92%   BMI 27.08 kg/m       Assessment & Plan:  [name] comes in today with chief complaint of Hospital Follwup   Diagnosis and orders addressed:  1. Non-small cell lung cancer, right (HCC) - CMP14+EGFR - CBC with Differential/Platelet  2. Acute on chronic diastolic CHF (congestive heart failure) (HCC) Continue Demadex  Take an extra with >3 lbs in a day or >5 lbs in a week Low salt diet - CMP14+EGFR - CBC with Differential/Platelet  3. COPD with acute exacerbation (HCC) - CMP14+EGFR - CBC with Differential/Platelet  4. Hospital discharge follow-up - CMP14+EGFR - CBC with Differential/Platelet  5. Current smoker - CMP14+EGFR - CBC with Differential/Platelet  6. Chronic respiratory failure with hypoxia (HCC) Pt trying to get [employer] oxygen concentrator,  number given for patient to call.   Labs pending Health Maintenance reviewed Diet and exercise encouraged  Follow up plan: 1 month   Christy Hawks, FNP  

## 2021-08-30 DIAGNOSIS — E1165 Type 2 diabetes mellitus with hyperglycemia: Secondary | ICD-10-CM | POA: Diagnosis not present

## 2021-08-30 DIAGNOSIS — J9611 Chronic respiratory failure with hypoxia: Secondary | ICD-10-CM | POA: Diagnosis not present

## 2021-08-30 DIAGNOSIS — I110 Hypertensive heart disease with heart failure: Secondary | ICD-10-CM | POA: Diagnosis not present

## 2021-08-30 DIAGNOSIS — I5033 Acute on chronic diastolic (congestive) heart failure: Secondary | ICD-10-CM | POA: Diagnosis not present

## 2021-08-30 DIAGNOSIS — C3491 Malignant neoplasm of unspecified part of right bronchus or lung: Secondary | ICD-10-CM | POA: Diagnosis not present

## 2021-08-30 DIAGNOSIS — J441 Chronic obstructive pulmonary disease with (acute) exacerbation: Secondary | ICD-10-CM | POA: Diagnosis not present

## 2021-08-30 LAB — CMP14+EGFR
ALT: 39 IU/L (ref 0–44)
AST: 24 IU/L (ref 0–40)
Albumin/Globulin Ratio: 1.9 (ref 1.2–2.2)
Albumin: 4.4 g/dL (ref 3.7–4.7)
Alkaline Phosphatase: 149 IU/L — ABNORMAL HIGH (ref 44–121)
BUN/Creatinine Ratio: 36 — ABNORMAL HIGH (ref 10–24)
BUN: 37 mg/dL — ABNORMAL HIGH (ref 8–27)
Bilirubin Total: 0.3 mg/dL (ref 0.0–1.2)
CO2: 26 mmol/L (ref 20–29)
Calcium: 9.3 mg/dL (ref 8.6–10.2)
Chloride: 97 mmol/L (ref 96–106)
Creatinine, Ser: 1.03 mg/dL (ref 0.76–1.27)
Globulin, Total: 2.3 g/dL (ref 1.5–4.5)
Glucose: 424 mg/dL (ref 70–99)
Potassium: 4.4 mmol/L (ref 3.5–5.2)
Sodium: 141 mmol/L (ref 134–144)
Total Protein: 6.7 g/dL (ref 6.0–8.5)
eGFR: 76 mL/min/{1.73_m2} (ref >59)

## 2021-08-30 LAB — CBC WITH DIFFERENTIAL/PLATELET
Basophils Absolute: 0 10*3/uL (ref 0.0–0.2)
Basos: 0 %
EOS (ABSOLUTE): 0.1 10*3/uL (ref 0.0–0.4)
Eos: 0 %
Hematocrit: 32.9 % — ABNORMAL LOW (ref 37.5–51.0)
Hemoglobin: 10.3 g/dL — ABNORMAL LOW (ref 13.0–17.7)
Immature Grans (Abs): 0.1 10*3/uL (ref 0.0–0.1)
Immature Granulocytes: 1 %
Lymphocytes Absolute: 0.3 10*3/uL — ABNORMAL LOW (ref 0.7–3.1)
Lymphs: 2 %
MCH: 34.4 pg — ABNORMAL HIGH (ref 26.6–33.0)
MCHC: 31.3 g/dL — ABNORMAL LOW (ref 31.5–35.7)
MCV: 110 fL — ABNORMAL HIGH (ref 79–97)
Monocytes Absolute: 0.7 10*3/uL (ref 0.1–0.9)
Monocytes: 6 %
Neutrophils Absolute: 10.6 10*3/uL — ABNORMAL HIGH (ref 1.4–7.0)
Neutrophils: 91 %
Platelets: 314 10*3/uL (ref 150–450)
RBC: 2.99 x10E6/uL — ABNORMAL LOW (ref 4.14–5.80)
RDW: 12.6 % (ref 11.6–15.4)
WBC: 11.6 10*3/uL — ABNORMAL HIGH (ref 3.4–10.8)

## 2021-09-03 ENCOUNTER — Ambulatory Visit: Payer: Medicare Other | Admitting: Family Medicine

## 2021-09-03 ENCOUNTER — Telehealth: Payer: Self-pay | Admitting: Family

## 2021-09-03 MED ORDER — ONDANSETRON HCL 4 MG PO TABS: 4.0000 mg | ORAL_TABLET | Freq: Three times a day (TID) | ORAL | 0 refills | Status: DC | PRN

## 2021-09-03 NOTE — Telephone Encounter (Signed)
Zofran Prescription sent to pharmacy. Continue to monitor blood glucose and strict low carb diet.

## 2021-09-03 NOTE — Telephone Encounter (Signed)
Pt aware of provider feedback and that rx sent in and voiced understanding.

## 2021-09-04 ENCOUNTER — Telehealth: Payer: Self-pay | Admitting: *Deleted

## 2021-09-04 DIAGNOSIS — E1165 Type 2 diabetes mellitus with hyperglycemia: Secondary | ICD-10-CM | POA: Diagnosis not present

## 2021-09-04 DIAGNOSIS — I5033 Acute on chronic diastolic (congestive) heart failure: Secondary | ICD-10-CM | POA: Diagnosis not present

## 2021-09-04 DIAGNOSIS — C3491 Malignant neoplasm of unspecified part of right bronchus or lung: Secondary | ICD-10-CM | POA: Diagnosis not present

## 2021-09-04 DIAGNOSIS — J9611 Chronic respiratory failure with hypoxia: Secondary | ICD-10-CM | POA: Diagnosis not present

## 2021-09-04 DIAGNOSIS — J441 Chronic obstructive pulmonary disease with (acute) exacerbation: Secondary | ICD-10-CM | POA: Diagnosis not present

## 2021-09-04 DIAGNOSIS — I110 Hypertensive heart disease with heart failure: Secondary | ICD-10-CM | POA: Diagnosis not present

## 2021-09-04 NOTE — Telephone Encounter (Signed)
John [name] pt is checking his O2 levels since he got out of the hospital and his visit w/ his PCP. While O2 is on levels are between 98%-100% when off it drops to 86-89, then will go up to 96. He is up moving around when it drops. Informed him that his O2 target range is between 89-92% and activity w/o his oxygen will bring his level down. We discussed POC tank from his visit last week, he will get in contact with [employer] and if need to we can send order to [place]. We also discussed decreased appetite, he does have Boost at home but it is increasing his BS levels, instructed him to check the meal supplements for ones with lower sugar. We made an appt w/ his PCP for 09/05/21 to discuss all of the above in further detail.

## 2021-09-05 ENCOUNTER — Encounter: Payer: Self-pay | Admitting: Family

## 2021-09-05 ENCOUNTER — Ambulatory Visit (INDEPENDENT_AMBULATORY_CARE_PROVIDER_SITE_OTHER): Payer: Medicare Other | Admitting: Family

## 2021-09-05 VITALS — BP 118/66 | HR 84 | Temp 97.6°F | Ht 66.0 in | Wt 164.0 lb

## 2021-09-05 DIAGNOSIS — B370 Candidal stomatitis: Secondary | ICD-10-CM

## 2021-09-05 DIAGNOSIS — R630 Anorexia: Secondary | ICD-10-CM | POA: Diagnosis not present

## 2021-09-05 DIAGNOSIS — R34 Anuria and oliguria: Secondary | ICD-10-CM

## 2021-09-05 DIAGNOSIS — L282 Other prurigo: Secondary | ICD-10-CM | POA: Diagnosis not present

## 2021-09-05 DIAGNOSIS — R110 Nausea: Secondary | ICD-10-CM

## 2021-09-05 LAB — MICROSCOPIC EXAMINATION
Bacteria, UA: NONE SEEN
Epithelial Cells (non renal): NONE SEEN /hpf (ref 0–10)
RBC, Urine: NONE SEEN /hpf (ref 0–2)
Renal Epithel, UA: NONE SEEN /hpf
WBC, UA: NONE SEEN /hpf (ref 0–5)

## 2021-09-05 LAB — URINALYSIS, COMPLETE
Bilirubin, UA: NEGATIVE
Glucose, UA: NEGATIVE
Ketones, UA: NEGATIVE
Leukocytes,UA: NEGATIVE
Nitrite, UA: NEGATIVE
Protein,UA: NEGATIVE
RBC, UA: NEGATIVE
Specific Gravity, UA: 1.015 (ref 1.005–1.030)
Urobilinogen, Ur: 0.2 mg/dL (ref 0.2–1.0)
pH, UA: 5.5 (ref 5.0–7.5)

## 2021-09-05 MED ORDER — NYSTATIN 100000 UNIT/ML MT SUSP: 5.0000 mL | Freq: Four times a day (QID) | OROMUCOSAL | 1 refills | Status: DC

## 2021-09-05 NOTE — Patient Instructions (Signed)

## 2021-09-05 NOTE — Progress Notes (Unsigned)
Cardiology Office Note:   Date:  09/06/2021  NAME:  John [name]    MRN: [id number] DOB:  1945/10/17   PCP:  [name], FNP  Cardiologist:  Evalina Field, MD  Electrophysiologist:  None   Referring MD: [name], FNP   Chief Complaint  Patient presents with   Follow-up        History of Present Illness:   John [name] is a 76 y.o. male with a hx of HFpEF, PAD, HLD, who presents for follow-up.  Recent admission to the hospital for COPD exacerbation.  Review of records show he was slightly volume up as well.  Weight is down 3 pounds since that admission.  Taking 20 mg of torsemide twice daily.  Reports minimal effect.  We discussed increasing to 40 mg in the morning.  Recent lab work from primary care physician so stable creatinine at 1.03.  Potassium 4.4.  Most recent LDL 80.  Hemoglobin 9.8.  Denies any bleeding but does bruise easily.  Reports he is unsteady on his feet.  Reports recent falls.  Reports balance issues.  Is on Neurontin.  Is completely inactive.  We discussed getting more active and working with physical therapy.  Still smoking 1/2 pack/day.  No symptoms of claudication.  No chest pain.  Reports low energy.  Does have sleep apnea but has not been reevaluated in several years.  We discussed reaching back out to his sleep medicine doctor.  Pulse is regular.  No recurrence of atrial flutter that I can tell.  Problem List 1. Coronary calcifications 2. Atrial flutter -Dx 08/21/2020 -CHADSVASC=5 (age, DM, PAD, HTN, CHF) -DCCV 10/05/2020 3. PAD -R femoral endarterectomy 08/20/2020 -R common iliac artery stent 08/20/2020 4. Non-small cell lung CA -2018 s/p chemo/radiation  -recurrent nodules without recurrence  5. COPD 6. Tobacco abuse 7. Gastric ulcer/Recurrent anemia -EGD 09/2020 8. DM -A1c 4.7 9. HLD -T chol 126, HDL 28, LDL 80, TG 89 10. HFpEF 11. OSA  Past Medical History: Past Medical History:  Diagnosis Date   Adenocarcinoma of lung, right  ([place]) 04/18/2016   Anxiety    Arthritis    Asthma    BPH (benign prostatic hyperplasia)    with urinary retention 02/06/20   CHF (congestive heart failure) (HCC)    COPD (chronic obstructive pulmonary disease) (HCC)    Depression    Diabetes mellitus, type II ([place])    Dyspnea    History of kidney stones    History of radiation therapy    right lung 09/10/2020-09/17/2020   Dr Sondra Come   Hyperlipidemia    Hypertension    Hypothyroidism    Macular degeneration    Neuropathy    Non-small cell lung cancer, right ([place]) 04/18/2016   Peripheral vascular disease ([place])    Prostatitis    Pulmonary nodule, left 07/16/2016   Sleep apnea    cpap    Past Surgical History: Past Surgical History:  Procedure Laterality Date   ABDOMINAL AORTOGRAM W/LOWER EXTREMITY Left 02/06/2020   Procedure: ABDOMINAL AORTOGRAM W/LOWER EXTREMITY;  Surgeon: Lorretta Harp, MD;  Location: [place] [facility];  Service: Cardiovascular;  Laterality: Left;   ABDOMINAL AORTOGRAM W/LOWER EXTREMITY N/A 05/30/2021   Procedure: ABDOMINAL AORTOGRAM W/LOWER EXTREMITY;  Surgeon: Marty Heck, MD;  Location: [facility] [place] [facility];  Service: Cardiovascular;  Laterality: N/A;   CARDIOVERSION N/A 10/05/2020   Procedure: CARDIOVERSION;  Surgeon: Geralynn Rile, MD;  Location: [place];  Service: Cardiovascular;  Laterality:  N/A;   CATARACT EXTRACTION, BILATERAL Bilateral    COLONOSCOPY WITH PROPOFOL N/A 06/20/2021   Procedure: COLONOSCOPY WITH PROPOFOL;  Surgeon: Irene Shipper, MD;  Location: [facility];  Service: Endoscopy;  Laterality: N/A;   ENDARTERECTOMY FEMORAL Right 08/20/2020   Procedure: ENDARTERECTOMY  RIGHT FEMORAL ARTERY;  Surgeon: Marty Heck, MD;  Location: [place];  Service: Vascular;  Laterality: Right;   ESOPHAGOGASTRODUODENOSCOPY (EGD) WITH PROPOFOL N/A 09/06/2020   Procedure: ESOPHAGOGASTRODUODENOSCOPY (EGD) WITH PROPOFOL;  Surgeon: Irene Shipper, MD;  [facility];  Service:  Endoscopy;  Laterality: N/A;   INSERTION OF ILIAC STENT Right 08/20/2020   Procedure: RETROGRADE INSERTION OF RIGHT ILIAC STENT;  Surgeon: Marty Heck, MD;  Location: [place];  Service: Vascular;  Laterality: Right;   INTRAOPERATIVE ARTERIOGRAM Right 08/20/2020   Procedure: INTRA OPERATIVE ARTERIOGRAM ILIAC;  Surgeon: Marty Heck, MD;  Location: [place];  Service: Vascular;  Laterality: Right;   PATCH ANGIOPLASTY Right 08/20/2020   Procedure: PATCH ANGIOPLASTY RIGHT FEMORAL ARTERY;  Surgeon: Marty Heck, MD;  Location: [place];  Service: Vascular;  Laterality: Right;   PERIPHERAL VASCULAR BALLOON ANGIOPLASTY Right 05/30/2021   Procedure: PERIPHERAL VASCULAR BALLOON ANGIOPLASTY;  Surgeon: Marty Heck, MD;  Location: [place] [facility];  Service: Cardiovascular;  Laterality: Right;   PORTACATH PLACEMENT Left 06/13/2016   Procedure: INSERTION PORT-A-CATH;  Surgeon: Aviva Signs, MD;  Location: [facility];  Service: General;  Laterality: Left;   TRANSURETHRAL RESECTION OF PROSTATE N/A 05/31/2020   Procedure: TRANSURETHRAL RESECTION OF THE PROSTATE (TURP);  Surgeon: Cleon Gustin, MD;  Location: [facility];  Service: Urology;  Laterality: N/A;   VIDEO BRONCHOSCOPY WITH ENDOBRONCHIAL NAVIGATION N/A 05/28/2016   Procedure: VIDEO BRONCHOSCOPY WITH ENDOBRONCHIAL NAVIGATION;  Surgeon: [name], MD;  Location: [place];  Service: Thoracic;  Laterality: N/A;   VIDEO BRONCHOSCOPY WITH ENDOBRONCHIAL ULTRASOUND N/A 05/28/2016   Procedure: VIDEO BRONCHOSCOPY WITH ENDOBRONCHIAL ULTRASOUND;  Surgeon: [name], MD;  Location: [facility];  Service: Thoracic;  Laterality: N/A;    Current Medications: Current Meds  Medication Sig   acetaminophen (TYLENOL) 650 MG CR tablet Take 1,300 mg by mouth every 8 (eight) hours as needed for pain.   albuterol (PROVENTIL) (2.5 MG/3ML) 0.083% nebulizer solution Take 3 mLs (2.5 mg total) by nebulization every 4 (four) hours as needed for  wheezing or shortness of breath (if not able to take inhaler).   albuterol (VENTOLIN HFA) 108 (90 Base) MCG/ACT inhaler Inhale 2 puffs into the lungs every 4 (four) hours as needed for wheezing or shortness of breath.   apixaban (ELIQUIS) 5 MG TABS tablet TAKE 1 TABLET BY MOUTH TWICE A DAY (Patient taking differently: Take 5 mg by mouth 2 (two) times daily.)   aspirin EC 81 MG tablet Take 81 mg by mouth daily. Swallow whole.   Blood Glucose Monitoring Suppl (ONETOUCH VERIO REFLECT) w/Device KIT Use to test blood sugars daily as directed. DX: E11.9   Cyanocobalamin (B-12 PO) Take 1 tablet by mouth daily.   doxycycline (VIBRA-TABS) 100 MG tablet Take 1 tablet (100 mg total) by mouth 2 (two) times daily.   fluticasone (FLONASE) 50 MCG/ACT nasal spray PLACE 1 SPRAY INTO BOTH NOSTRILS DAILY AS NEEDED FOR ALLERGIES OR RHINITIS.   Fluticasone-Umeclidin-Vilant (TRELEGY ELLIPTA) 100-62.5-25 MCG/INH AEPB Inhale 1 puff into the lungs daily.   gabapentin (NEURONTIN) 300 MG capsule TAKE 1 CAPSULE BY MOUTH THREE TIMES A DAY (Patient taking differently: Take 300-600 mg by mouth See admin  instructions. Taking 300 mg in the afternoon and 600 mg at bedtime)   Iron, Ferrous Sulfate, 325 (65 Fe) MG TABS Take 325 mg by mouth 2 (two) times daily. (Patient taking differently: Take 325 mg by mouth daily.)   KLOR-CON M20 20 MEQ tablet TAKE 1 TABLET BY MOUTH EVERY DAY (Patient taking differently: Take 20 mEq by mouth daily.)   levothyroxine (SYNTHROID) 50 MCG tablet TAKE 1 TABLET BY MOUTH EVERY DAY (Patient taking differently: Take 50 mcg by mouth daily before breakfast.)   LINZESS 145 MCG CAPS capsule TAKE 1 CAPSULE (145 MCG TOTAL) BY MOUTH DAILY. TO REGULATE BOWEL MOVEMENTS   Melatonin 10 MG TABS Take 10 mg by mouth at bedtime as needed (sleep).   nebivolol (BYSTOLIC) 2.5 MG tablet TAKE 1 TABLET BY MOUTH EVERY DAY (Patient taking differently: Take 2.5 mg by mouth daily.)   nystatin (MYCOSTATIN) 100000 UNIT/ML suspension  Take 5 mLs (500,000 Units total) by mouth 4 (four) times daily.   ondansetron (ZOFRAN) 4 MG tablet Take 1 tablet (4 mg total) by mouth every 8 (eight) hours as needed for nausea or vomiting.   OneTouch Delica Lancets 66A MISC Use to test blood sugars daily as directed. DX: E11.9   ONETOUCH VERIO test strip USE TO TEST BLOOD SUGARS DAILY AS DIRECTED. DX: E11.9   pantoprazole (PROTONIX) 40 MG tablet Take 1 tablet (40 mg total) by mouth daily.   pravastatin (PRAVACHOL) 40 MG tablet TAKE 1 TABLET (40 MG TOTAL) BY MOUTH ON MONDAYS , WEDNESDAYS, AND FRIDAYS AS DIRECTED (Patient taking differently: Take 40 mg by mouth 3 (three) times a week.)   Semaglutide (RYBELSUS) 7 MG TABS Take 7 mg by mouth daily.   traZODone (DESYREL) 50 MG tablet Take 0.5 tablets (25 mg total) by mouth at bedtime.   [DISCONTINUED] torsemide (DEMADEX) 20 MG tablet TAKE 2 TABLETS (40 MG) DAILY-CAN TAKE ADDITIONAL 2 TABS IN AFTERNOON FOR WEIGHT GAIN 3 LBS IN 1 DAY OR 5 LBS IN 1 WEEK, OR WORSENING LOWER EXTREMITY EDEMA OR SHORTNESS OF BREATH (Patient taking differently: Take 40 mg by mouth daily. TAKE 2 TABLETS (40 MG) DAILY-CAN TAKE ADDITIONAL 2 TABS IN AFTERNOON FOR WEIGHT GAIN 3 LBS IN 1 DAY OR 5 LBS IN 1 WEEK, OR WORSENING LOWER EXTREMITY EDEMA OR SHORTNESS OF BREATH)     Allergies:    Sulfa antibiotics, Atorvastatin, Jardiance [empagliflozin], Lopressor [metoprolol], Rosuvastatin, Doxycycline, and Temazepam   Social History: Social History   Socioeconomic History   Marital status: Soil scientist    Spouse name: Not on file   Number of children: 5   Years of education: 10   Highest education level: 10th grade  Occupational History   Occupation: Retired  Tobacco Use   Smoking status: Every Day    Packs/day: 0.50    Years: 55.00    Pack years: 27.50    Types: Cigarettes    Start date: 03/11/1961   Smokeless tobacco: Never   Tobacco comments:    1/2 pack to 1 pack per day 12/14/19  Vaping Use   Vaping Use: Never used   Substance and Sexual Activity   Alcohol use: No   Drug use: No   Sexual activity: Yes    Birth control/protection: None  Other Topics Concern   Not on file  Social History Narrative   [name] is his POA and lives with him. Children out of town - one in [facility], others in [place].   Social Determinants of Health   Financial  Resource Strain: Low Risk    Difficulty of Paying Living Expenses: Not hard at all  Food Insecurity: No Food Insecurity   Worried About Charity fundraiser in the Last Year: Never true   Ran Out of Food in the Last Year: Never true  Transportation Needs: No Transportation Needs   Lack of Transportation (Medical): No   Lack of Transportation (Non-Medical): No  Physical Activity: Unknown   Days of Exercise per Week: 7 days   Minutes of Exercise per Session: Not on file  Stress: No Stress Concern Present   Feeling of Stress : Not at all  Social Connections: Moderately Isolated   Frequency of Communication with Friends and Family: More than three times a week   Frequency of Social Gatherings with Friends and Family: Once a week   Attends Religious Services: Never   Marine scientist or Organizations: No   Attends Music therapist: Never   Marital Status: Living with partner     Family History: The patient's family history includes Diabetes in his father; Heart disease in his father; Hypertension in his mother and sister; Stroke in his father.  ROS:   All other ROS reviewed and negative. Pertinent positives noted in the HPI.     EKGs/Labs/Other Studies Reviewed:   The following studies were personally reviewed by me today:  TTE 08/21/2020  1. Left ventricular ejection fraction, by estimation, is 60 to 65%. The  left ventricle has normal function. The left ventricle has no regional  wall motion abnormalities. Left ventricular diastolic parameters are  indeterminate.   2. Right ventricular systolic function is mildly reduced. The  right  ventricular size is normal.   3. The mitral valve is abnormal. No evidence of mitral valve  regurgitation. The mean mitral valve gradient is 4.0 mmHg with average  heart rate of 87 bpm. Moderate mitral annular calcification.   4. The aortic valve is calcified. Aortic valve regurgitation is not  visualized. Mild to moderate aortic valve sclerosis/calcification is  present. Aortic valve area, by VTI measures 0.91 cm. Aortic valve mean  gradient measures 8.0 mmHg. Aortic valve  Vmax measures 1.88 m/s. Aortic valve acceleration time measures 97 msec.  There is discordance between the AVA and gradients; DVI is 0.36 with LV  stroke volume index of 30; Aortic stenosis may be underestimated in this  study. Consider outpatient  monitoring if clinically indicated.   Recent Labs: 08/23/2021: TSH 2.510 08/25/2021: B Natriuretic Peptide 407.4; Magnesium 1.9 08/29/2021: ALT 39; BUN 37; Creatinine, Ser 1.03; Potassium 4.4; Sodium 141 09/05/2021: Hemoglobin 9.8; Platelets 313   Recent Lipid Panel    Component Value Date/Time   CHOL 126 08/21/2020 0530   CHOL 136 04/25/2019 1608   TRIG 89 08/21/2020 0530   HDL 28 (L) 08/21/2020 0530   HDL 28 (L) 04/25/2019 1608   CHOLHDL 4.5 08/21/2020 0530   VLDL 18 08/21/2020 0530   LDLCALC 80 08/21/2020 0530   LDLCALC 78 04/25/2019 1608    Physical Exam:   VS:  BP 126/60   Pulse 80   Ht 5' 6" (1.676 m)   Wt 164 lb 12.8 oz (74.8 kg)   SpO2 96%   BMI 26.60 kg/m    Wt Readings from Last 3 Encounters:  09/06/21 164 lb 12.8 oz (74.8 kg)  09/05/21 164 lb (74.4 kg)  08/29/21 167 lb 12.8 oz (76.1 kg)    General: Well nourished, well developed, in no acute distress Head: Atraumatic, normal size  Eyes: PEERLA, EOMI  Neck: Supple, no JVD Endocrine: No thryomegaly Cardiac: Normal S1, S2; RRR; no murmurs, rubs, or gallops Lungs: Clear to auscultation bilaterally, no wheezing, rhonchi or rales  Abd: Soft, nontender, no hepatomegaly  Ext: Trace  edema Musculoskeletal: No deformities, BUE and BLE strength normal and equal Skin: Warm and dry, no rashes   Neuro: Alert and oriented to person, place, time, and situation, CNII-XII grossly intact, no focal deficits  Psych: Normal mood and affect   ASSESSMENT:   John [name] is a 76 y.o. male who presents for the following: 1. Chronic diastolic CHF (congestive heart failure) ([place])   2. Atrial flutter, unspecified type ([place])   3. PAD (peripheral artery disease) ([place])   4. Hyperlipidemia, unspecified hyperlipidemia type   5. OSA (obstructive sleep apnea)   6. Primary insomnia     PLAN:   1. Chronic diastolic CHF (congestive heart failure) (HCC) -Euvolemic on exam.  Would increase his torsemide to 40 mg in the morning.  Can take an additional 40 mg in the evening.  Overall fairly euvolemic.  Weight is down 3 pounds since hospitalization.  Advised to avoid salt.  Needs to get more active.  2. Atrial flutter, unspecified type ([place]) -Status post cardioversion.  No recurrence.  Continue Eliquis 5 mg twice daily.  3. PAD (peripheral artery disease) ([place]) -Status post multiple interventions.  On aspirin.  On pravastatin.  Cannot tolerate other agents.  LDL 80.  Likely acceptable.  4. Hyperlipidemia, unspecified hyperlipidemia type -Continue pravastatin Monday Wednesdays and Fridays.  5. OSA (obstructive sleep apnea) -He reports low energy.  Does have sleep apnea.  Follows sleep easily.  I really think he needs to have his sleep apnea reevaluated.  He will reach back out to his primary care physician.  I do not see a cardiac reason for this.  6. Primary insomnia -We will give him trazodone 25 mg daily.  Again needs to reach out to his sleep medicine physician.      Disposition: Return in about 6 months (around 03/08/2022).  Medication Adjustments/Labs and Tests Ordered: Current medicines are reviewed at length with the patient today.  Concerns regarding medicines are outlined  above.  No orders of the defined types were placed in this encounter.  Meds ordered this encounter  Medications   torsemide (DEMADEX) 20 MG tablet    Sig: Take 2 tablets (40 mg) in the morning, take 2 tablets (40 mg) in the evening daily as needed.    Dispense:  270 tablet    Refill:  0   traZODone (DESYREL) 50 MG tablet    Sig: Take 0.5 tablets (25 mg total) by mouth at bedtime.    Dispense:  45 tablet    Refill:  1    Patient Instructions  Medication Instructions:  Increase Torsemide 40 mg in the morning- and 40 mg in the evening as needed.  START Trazodone 25 mg nightly.   *If you need a refill on your cardiac medications before your next appointment, please call your pharmacy*   Follow-Up: At [facility], you and your health needs are our priority.  As part of our continuing mission to provide you with exceptional heart care, we have created designated Provider Care Teams.  These Care Teams include your primary Cardiologist (physician) and Advanced Practice Providers (APPs -  Physician Assistants and Nurse Practitioners) who all work together to provide you with the care you need, when you need it.  We recommend signing up for  the patient portal called "MyChart".  Sign up information is provided on this After Visit Summary.  MyChart is used to connect with patients for Virtual Visits (Telemedicine).  Patients are able to view lab/test results, encounter notes, upcoming appointments, etc.  Non-urgent messages can be sent to your provider as well.   To learn more about what you can do with MyChart, go to [email or web address].    Your next appointment:   6 month(s)  The format for your next appointment:   In Person  Provider:   Evalina Field, MD {            Time Spent with Patient: I have spent a total of 35 minutes with patient reviewing hospital notes, telemetry, EKGs, labs and examining the patient as well as establishing an assessment and plan that was  discussed with the patient.  > 50% of time was spent in direct patient care.  Signed, Addison Naegeli. Audie Box, MD, [place]  [address], [place], [place] [address] [phone]  09/06/2021 8:33 AM

## 2021-09-05 NOTE — Progress Notes (Addendum)
Subjective:    Patient ID: John [name], male    DOB: Aug 24, 1945, 76 y.o.   MRN: [id number]  Chief Complaint  Patient presents with   Follow-up   Nasal Congestion   Allergic Reaction    Pt states his tongue has been swollen , he cant eat     Nausea   skin check    Break out on his back - states they ate itching bad - has not gotten any better    Pt presents to the office today with mouth sores that have been on going over the last week. He thought this was from his nebulizer and stopped that.   His O2 is 93% sitting. O2 sat drops to 85-86% with walking on room air.   He is complaining of nausea and decreased appetite .  Rash This is a recurrent problem. Location: back. The rash is characterized by itchiness and dryness. He was exposed to nothing. Past treatments include anti-itch cream. The treatment provided mild relief.     Review of Systems  Skin:  Positive for rash.  All other systems reviewed and are negative.     Objective:   Physical Exam Vitals reviewed.  Constitutional:      General: He is not in acute distress.    Appearance: He is well-developed.  HENT:     Head: Normocephalic.     Comments: Oral thrush on tongue and cheeks Eyes:     General:        Right eye: No discharge.        Left eye: No discharge.     Pupils: Pupils are equal, round, and reactive to light.  Neck:     Thyroid: No thyromegaly.  Cardiovascular:     Rate and Rhythm: Normal rate and regular rhythm.     Heart sounds: Normal heart sounds. No murmur heard. Pulmonary:     Effort: Pulmonary effort is normal. No respiratory distress.     Breath sounds: Normal breath sounds. No wheezing.  Abdominal:     General: Bowel sounds are normal. There is no distension.     Palpations: Abdomen is soft.     Tenderness: There is no abdominal tenderness.  Musculoskeletal:        General: No tenderness. Normal range of motion.     Cervical back: Normal range of motion and neck supple.  Skin:     General: Skin is warm and dry.     Findings: No erythema or rash.     Comments: No rash noted on back  Neurological:     Mental Status: He is alert and oriented to person, place, and time.     Cranial Nerves: No cranial nerve deficit.     Motor: Weakness present.     Gait: Gait abnormal.     Deep Tendon Reflexes: Reflexes are normal and symmetric.  Psychiatric:        Behavior: Behavior normal.        Thought Content: Thought content normal.        Judgment: Judgment normal.      BP 118/66   Pulse 84   Temp 97.6 F (36.4 C)   Ht 5\' 6"  (1.676 m)   Wt 164 lb (74.4 kg)   SpO2 93%   BMI 26.47 kg/m      Assessment & Plan:  John [name] comes in today with chief complaint of Follow-up, Nasal Congestion, Allergic Reaction (Pt states his tongue has been swollen ,  he cant eat  ), Nausea, and skin check (Break out on his back - states they ate itching bad - has not gotten any better )   Diagnosis and orders addressed:  1. Oral thrush Nystatin QID  Rinse mouth after using inhalers - nystatin (MYCOSTATIN) 100000 UNIT/ML suspension; Take 5 mLs (500,000 Units total) by mouth 4 (four) times daily.  Dispense: 120 mL; Refill: 1 - CBC with Differential/Platelet  2. Nauseous Zofran as needed  - Urinalysis, Complete - CBC with Differential/Platelet  3. Decreased appetite Ensure BID with meals - Urinalysis, Complete - CBC with Differential/Platelet  4. Pruritic rash No rash noted. - CBC with Differential/Platelet  5. Decreased urine output Labs pending  - Urinalysis, Complete - CBC with Differential/Platelet   Labs pending Health Maintenance reviewed Diet and exercise encouraged  Follow up plan: 1 month    Evelina Dun, FNP

## 2021-09-06 ENCOUNTER — Encounter: Payer: Self-pay | Admitting: Cardiovascular Disease

## 2021-09-06 ENCOUNTER — Ambulatory Visit (INDEPENDENT_AMBULATORY_CARE_PROVIDER_SITE_OTHER): Payer: Medicare Other | Admitting: Cardiovascular Disease

## 2021-09-06 ENCOUNTER — Ambulatory Visit (INDEPENDENT_AMBULATORY_CARE_PROVIDER_SITE_OTHER): Payer: Medicare Other

## 2021-09-06 ENCOUNTER — Other Ambulatory Visit: Payer: Self-pay | Admitting: Family

## 2021-09-06 VITALS — BP 126/60 | HR 80 | Ht 66.0 in | Wt 164.8 lb

## 2021-09-06 DIAGNOSIS — G4733 Obstructive sleep apnea (adult) (pediatric): Secondary | ICD-10-CM | POA: Diagnosis not present

## 2021-09-06 DIAGNOSIS — I5033 Acute on chronic diastolic (congestive) heart failure: Secondary | ICD-10-CM | POA: Diagnosis not present

## 2021-09-06 DIAGNOSIS — F5101 Primary insomnia: Secondary | ICD-10-CM

## 2021-09-06 DIAGNOSIS — I4892 Unspecified atrial flutter: Secondary | ICD-10-CM | POA: Diagnosis not present

## 2021-09-06 DIAGNOSIS — I5032 Chronic diastolic (congestive) heart failure: Secondary | ICD-10-CM | POA: Diagnosis not present

## 2021-09-06 DIAGNOSIS — I110 Hypertensive heart disease with heart failure: Secondary | ICD-10-CM | POA: Diagnosis not present

## 2021-09-06 DIAGNOSIS — J9611 Chronic respiratory failure with hypoxia: Secondary | ICD-10-CM | POA: Diagnosis not present

## 2021-09-06 DIAGNOSIS — E785 Hyperlipidemia, unspecified: Secondary | ICD-10-CM

## 2021-09-06 DIAGNOSIS — I480 Paroxysmal atrial fibrillation: Secondary | ICD-10-CM

## 2021-09-06 DIAGNOSIS — E1151 Type 2 diabetes mellitus with diabetic peripheral angiopathy without gangrene: Secondary | ICD-10-CM

## 2021-09-06 DIAGNOSIS — C3491 Malignant neoplasm of unspecified part of right bronchus or lung: Secondary | ICD-10-CM

## 2021-09-06 DIAGNOSIS — E1140 Type 2 diabetes mellitus with diabetic neuropathy, unspecified: Secondary | ICD-10-CM | POA: Diagnosis not present

## 2021-09-06 DIAGNOSIS — E032 Hypothyroidism due to medicaments and other exogenous substances: Secondary | ICD-10-CM

## 2021-09-06 DIAGNOSIS — E1169 Type 2 diabetes mellitus with other specified complication: Secondary | ICD-10-CM | POA: Diagnosis not present

## 2021-09-06 DIAGNOSIS — J441 Chronic obstructive pulmonary disease with (acute) exacerbation: Secondary | ICD-10-CM | POA: Diagnosis not present

## 2021-09-06 DIAGNOSIS — E1165 Type 2 diabetes mellitus with hyperglycemia: Secondary | ICD-10-CM

## 2021-09-06 DIAGNOSIS — I739 Peripheral vascular disease, unspecified: Secondary | ICD-10-CM

## 2021-09-06 DIAGNOSIS — D630 Anemia in neoplastic disease: Secondary | ICD-10-CM | POA: Diagnosis not present

## 2021-09-06 DIAGNOSIS — E782 Mixed hyperlipidemia: Secondary | ICD-10-CM

## 2021-09-06 LAB — CBC WITH DIFFERENTIAL/PLATELET
Basophils Absolute: 0 10*3/uL (ref 0.0–0.2)
Basos: 0 %
EOS (ABSOLUTE): 0.1 10*3/uL (ref 0.0–0.4)
Eos: 1 %
Hematocrit: 30.9 % — ABNORMAL LOW (ref 37.5–51.0)
Hemoglobin: 9.8 g/dL — ABNORMAL LOW (ref 13.0–17.7)
Immature Grans (Abs): 0.1 10*3/uL (ref 0.0–0.1)
Immature Granulocytes: 1 %
Lymphocytes Absolute: 0.5 10*3/uL — ABNORMAL LOW (ref 0.7–3.1)
Lymphs: 4 %
MCH: 33.8 pg — ABNORMAL HIGH (ref 26.6–33.0)
MCHC: 31.7 g/dL (ref 31.5–35.7)
MCV: 107 fL — ABNORMAL HIGH (ref 79–97)
Monocytes Absolute: 1.4 10*3/uL — ABNORMAL HIGH (ref 0.1–0.9)
Monocytes: 11 %
Neutrophils Absolute: 10.2 10*3/uL — ABNORMAL HIGH (ref 1.4–7.0)
Neutrophils: 83 %
Platelets: 313 10*3/uL (ref 150–450)
RBC: 2.9 x10E6/uL — ABNORMAL LOW (ref 4.14–5.80)
RDW: 12.7 % (ref 11.6–15.4)
WBC: 12.3 10*3/uL — ABNORMAL HIGH (ref 3.4–10.8)

## 2021-09-06 MED ORDER — TORSEMIDE 20 MG PO TABS
ORAL_TABLET | ORAL | 0 refills | Status: DC
Start: 2021-09-06 — End: 2021-10-31

## 2021-09-06 MED ORDER — TRAZODONE HCL 50 MG PO TABS
25.0000 mg | ORAL_TABLET | Freq: Every day | ORAL | 1 refills | Status: DC
Start: 2021-09-06 — End: 2021-10-09

## 2021-09-06 MED ORDER — DOXYCYCLINE HYCLATE 100 MG PO TABS: 100.0000 mg | ORAL_TABLET | Freq: Two times a day (BID) | ORAL | 0 refills | Status: DC

## 2021-09-06 NOTE — Patient Instructions (Signed)
Medication Instructions:  Increase Torsemide 40 mg in the morning- and 40 mg in the evening as needed.  START Trazodone 25 mg nightly.   *If you need a refill on your cardiac medications before your next appointment, please call your pharmacy*   Follow-Up: At [facility], you and your health needs are our priority.  As part of our continuing mission to provide you with exceptional heart care, we have created designated Provider Care Teams.  These Care Teams include your primary Cardiologist (physician) and Advanced Practice Providers (APPs -  Physician Assistants and Nurse Practitioners) who all work together to provide you with the care you need, when you need it.  We recommend signing up for the patient portal called "MyChart".  Sign up information is provided on this After Visit Summary.  MyChart is used to connect with patients for Virtual Visits (Telemedicine).  Patients are able to view lab/test results, encounter notes, upcoming appointments, etc.  Non-urgent messages can be sent to your provider as well.   To learn more about what you can do with MyChart, go to [email or web address].    Your next appointment:   6 month(s)  The format for your next appointment:   In Person  Provider:   Evalina Field, MD {

## 2021-09-09 DIAGNOSIS — J9611 Chronic respiratory failure with hypoxia: Secondary | ICD-10-CM | POA: Diagnosis not present

## 2021-09-09 DIAGNOSIS — I110 Hypertensive heart disease with heart failure: Secondary | ICD-10-CM | POA: Diagnosis not present

## 2021-09-09 DIAGNOSIS — C3491 Malignant neoplasm of unspecified part of right bronchus or lung: Secondary | ICD-10-CM | POA: Diagnosis not present

## 2021-09-09 DIAGNOSIS — E1165 Type 2 diabetes mellitus with hyperglycemia: Secondary | ICD-10-CM | POA: Diagnosis not present

## 2021-09-09 DIAGNOSIS — I5033 Acute on chronic diastolic (congestive) heart failure: Secondary | ICD-10-CM | POA: Diagnosis not present

## 2021-09-09 DIAGNOSIS — J441 Chronic obstructive pulmonary disease with (acute) exacerbation: Secondary | ICD-10-CM | POA: Diagnosis not present

## 2021-09-10 ENCOUNTER — Encounter: Payer: Self-pay | Admitting: Family

## 2021-09-10 ENCOUNTER — Ambulatory Visit (INDEPENDENT_AMBULATORY_CARE_PROVIDER_SITE_OTHER): Payer: Medicare Other | Admitting: Family

## 2021-09-10 ENCOUNTER — Ambulatory Visit: Payer: Medicare Other | Admitting: Family

## 2021-09-10 VITALS — BP 110/57 | HR 83 | Temp 96.9°F | Wt 165.4 lb

## 2021-09-10 DIAGNOSIS — K5900 Constipation, unspecified: Secondary | ICD-10-CM | POA: Diagnosis not present

## 2021-09-10 DIAGNOSIS — R231 Pallor: Secondary | ICD-10-CM | POA: Diagnosis not present

## 2021-09-10 DIAGNOSIS — I10 Essential (primary) hypertension: Secondary | ICD-10-CM | POA: Diagnosis not present

## 2021-09-10 DIAGNOSIS — I5033 Acute on chronic diastolic (congestive) heart failure: Secondary | ICD-10-CM | POA: Diagnosis not present

## 2021-09-10 DIAGNOSIS — R0600 Dyspnea, unspecified: Secondary | ICD-10-CM

## 2021-09-10 DIAGNOSIS — J441 Chronic obstructive pulmonary disease with (acute) exacerbation: Secondary | ICD-10-CM | POA: Diagnosis not present

## 2021-09-10 DIAGNOSIS — R5383 Other fatigue: Secondary | ICD-10-CM | POA: Diagnosis not present

## 2021-09-10 MED ORDER — LINACLOTIDE 72 MCG PO CAPS: 72.0000 ug | ORAL_CAPSULE | Freq: Every day | ORAL | 1 refills | Status: DC

## 2021-09-10 NOTE — Progress Notes (Signed)
Subjective:    Patient ID: John [name], male    DOB: [birth year], 76 y.o.   MRN: [id number]  Chief Complaint  Patient presents with   Medication Problem   Pt presents to the office today with complaints of fatigue and sleepy since starting the Bystolic. States he feels like he could "faint" at times.   He reports feeling SOB.    He saw his Cardiologists on 09/06/21 and told to continue current torsemide dose.  Shortness of Breath This is a chronic problem. The current episode started more than 1 month ago. The problem occurs intermittently.  Congestive Heart Failure Presents for follow-up visit. Associated symptoms include edema, fatigue, near-syncope and shortness of breath.  Constipation This is a chronic problem. The current episode started more than 1 year ago. The problem has been waxing and waning since onset. His stool frequency is 2 to 3 times per week. Risk factors include obesity. He has tried laxatives for the symptoms. The treatment provided mild relief.     Review of Systems  Constitutional:  Positive for fatigue.  Respiratory:  Positive for shortness of breath.   Cardiovascular:  Positive for near-syncope.  Gastrointestinal:  Positive for constipation.  All other systems reviewed and are negative.     Objective:   Physical Exam Vitals reviewed.  Constitutional:      General: He is not in acute distress.    Appearance: He is well-developed. He is obese.  HENT:     Head: Normocephalic.     Right Ear: Tympanic membrane normal.     Left Ear: Tympanic membrane normal.  Eyes:     General:        Right eye: No discharge.        Left eye: No discharge.     Pupils: Pupils are equal, round, and reactive to light.  Neck:     Thyroid: No thyromegaly.  Cardiovascular:     Rate and Rhythm: Normal rate and regular rhythm.     Heart sounds: Normal heart sounds. No murmur heard. Pulmonary:     Effort: Pulmonary effort is normal. No respiratory distress.      Breath sounds: Rhonchi present. No wheezing.     Comments: SOB with exertion Abdominal:     General: Bowel sounds are normal. There is no distension.     Palpations: Abdomen is soft.     Tenderness: There is no abdominal tenderness.  Musculoskeletal:        General: No tenderness. Normal range of motion.     Cervical back: Normal range of motion and neck supple.     Right lower leg: Edema (2+) present.     Left lower leg: Edema (trace) present.  Skin:    General: Skin is warm and dry.     Coloration: Skin is pale.     Findings: No erythema or rash.  Neurological:     Mental Status: He is alert and oriented to person, place, and time.     Cranial Nerves: No cranial nerve deficit.     Motor: Weakness (generalized weakness) present.     Deep Tendon Reflexes: Reflexes are normal and symmetric.  Psychiatric:        Behavior: Behavior normal.        Thought Content: Thought content normal.        Judgment: Judgment normal.      BP (!) 109/42   Pulse 83   Temp (!) 96.9 F (36.1 C)   Wt  165 lb 6.4 oz (75 kg)   SpO2 92%   BMI 26.70 kg/m   Assessment & Plan:  John [name] comes in today with chief complaint of Medication Problem   Diagnosis and orders addressed:  1. Other fatigue - CBC with Differential/Platelet  2. Pale - CBC with Differential/Platelet  3. Acute on chronic diastolic CHF (congestive heart failure) (HCC) - CBC with Differential/Platelet  4. Essential hypertension - CBC with Differential/Platelet  5. COPD with acute exacerbation (HCC) - CBC with Differential/Platelet  6. Dyspnea, unspecified type - CBC with Differential/Platelet  7. Constipation, unspecified constipation type Will decrease Linzess to 72 mcg from 145 mcg - linaclotide (LINZESS) 72 MCG capsule; Take 1 capsule (72 mcg total) by mouth daily before breakfast.  Dispense: 90 capsule; Refill: 1 - CBC with Differential/Platelet   Labs pending- If WBC continues to be elevated will  change doxycyline.  Health Maintenance reviewed Diet and exercise encouraged  Follow up plan: 1 month    John Dun, FNP

## 2021-09-10 NOTE — Patient Instructions (Signed)
Shortness of Breath, Adult Shortness of breath is when a person has trouble breathing or when a person feels like she or he is having trouble breathing in enough air. Shortness of breath could be a sign of a medical problem. Follow these instructions at home:  Pollutants Do not use any products that contain nicotine or tobacco. These products include cigarettes, chewing tobacco, and vaping devices, such as e-cigarettes. This also includes cigars and pipes. If you need help quitting, ask your health care provider. Avoid things that can irritate your airways, including: Smoke. This includes campfire smoke, forest fire smoke, and secondhand smoke from tobacco products. Do not smoke or allow others to smoke in your home. Mold. Dust. Air pollution. Chemical fumes. Things that can give you an allergic reaction (allergens) if you have allergies. Common allergens include pollen from grasses or trees and animal dander. Keep your living space clean and free of mold and dust. General instructions Pay attention to any changes in your symptoms. Take over-the-counter and prescription medicines only as told by your health care provider. This includes oxygen therapy and inhaled medicines. Rest as needed. Return to your normal activities as told by your health care provider. Ask your health care provider what activities are safe for you. Keep all follow-up visits. This is important. Contact a health care provider if: Your condition does not improve as soon as expected. You have a hard time doing your normal activities, even after you rest. You have new symptoms. You cannot walk up stairs or exercise the way that you normally do. Get help right away if: Your shortness of breath gets worse. You have shortness of breath when you are resting. You feel light-headed or you faint. You have a cough that is not controlled with medicines. You cough up blood. You have pain with breathing. You have pain in your  chest, arms, shoulders, or abdomen. You have a fever. These symptoms may be an emergency. Get help right away. Call 911. Do not wait to see if the symptoms will go away. Do not drive yourself to the hospital. Summary Shortness of breath is when a person has trouble breathing enough air. It can be a sign of a medical problem. Avoid things that irritate your lungs, such as smoking, pollution, mold, and dust. Pay attention to changes in your symptoms and contact your health care provider if you have a hard time completing daily activities because of shortness of breath. This information is not intended to replace advice given to you by your health care provider. Make sure you discuss any questions you have with your health care provider. Document Revised: 11/10/2020 Document Reviewed: 11/10/2020 Elsevier Patient Education  2023 [employer]  

## 2021-09-11 ENCOUNTER — Telehealth: Payer: Self-pay | Admitting: Family

## 2021-09-11 LAB — CBC WITH DIFFERENTIAL/PLATELET
Basophils Absolute: 0.1 10*3/uL (ref 0.0–0.2)
Basos: 1 %
EOS (ABSOLUTE): 0.1 10*3/uL (ref 0.0–0.4)
Eos: 1 %
Hematocrit: 27.1 % — ABNORMAL LOW (ref 37.5–51.0)
Hemoglobin: 8.5 g/dL — CL (ref 13.0–17.7)
Immature Grans (Abs): 0 10*3/uL (ref 0.0–0.1)
Immature Granulocytes: 0 %
Lymphocytes Absolute: 0.5 10*3/uL — ABNORMAL LOW (ref 0.7–3.1)
Lymphs: 5 %
MCH: 33.1 pg — ABNORMAL HIGH (ref 26.6–33.0)
MCHC: 31.4 g/dL — ABNORMAL LOW (ref 31.5–35.7)
MCV: 105 fL — ABNORMAL HIGH (ref 79–97)
Monocytes Absolute: 0.8 10*3/uL (ref 0.1–0.9)
Monocytes: 8 %
Neutrophils Absolute: 8.6 10*3/uL — ABNORMAL HIGH (ref 1.4–7.0)
Neutrophils: 85 %
Platelets: 286 10*3/uL (ref 150–450)
RBC: 2.57 x10E6/uL — CL (ref 4.14–5.80)
RDW: 12.5 % (ref 11.6–15.4)
WBC: 10.1 10*3/uL (ref 3.4–10.8)

## 2021-09-11 NOTE — Telephone Encounter (Signed)
LM to [facility]  

## 2021-09-11 NOTE — Telephone Encounter (Signed)
[facility] called with CRITICAL LAB RESULT.  Hgb 8.5

## 2021-09-11 NOTE — Telephone Encounter (Signed)
Hbg is 8.5. Have him come in tomorrow to recheck. Is he taking an iron supplement?

## 2021-09-12 ENCOUNTER — Other Ambulatory Visit: Payer: Self-pay | Admitting: Emergency Medicine

## 2021-09-12 ENCOUNTER — Other Ambulatory Visit: Payer: Self-pay

## 2021-09-12 ENCOUNTER — Telehealth: Payer: Self-pay | Admitting: Emergency Medicine

## 2021-09-12 ENCOUNTER — Other Ambulatory Visit: Payer: Medicare Other

## 2021-09-12 DIAGNOSIS — D582 Other hemoglobinopathies: Secondary | ICD-10-CM

## 2021-09-12 LAB — HEMOGLOBIN, FINGERSTICK: Hemoglobin: 9.7 g/dL — ABNORMAL LOW (ref 12.6–17.7)

## 2021-09-12 NOTE — Telephone Encounter (Signed)
Patient came in for recheck of Hemoglobin- in office it is 9.7 today. Patient reports feeling weak and  states he is wobbly when standing. Patient blood pressure today was 99/45. Pale color.   Please advise

## 2021-09-12 NOTE — Addendum Note (Signed)
Addended bySigurd Sos on: 09/12/2021 03:27 PM   Modules accepted: Orders

## 2021-09-12 NOTE — Telephone Encounter (Signed)
R/C on labs

## 2021-09-12 NOTE — Telephone Encounter (Signed)
Patient is taking an iron supplement daily - does he need to stop?

## 2021-09-12 NOTE — Addendum Note (Signed)
Addended bySigurd Sos on: 09/12/2021 03:11 PM   Modules accepted: Orders

## 2021-09-12 NOTE — Telephone Encounter (Signed)
Spoke to patient he is coming in today for repeat CBC.  Patient would like BP checked while here as it was low at last OV and he thinks he may be low now.

## 2021-09-13 ENCOUNTER — Inpatient Hospital Stay (HOSPITAL_COMMUNITY)
Admission: EM | Admit: 2021-09-13 | Discharge: 2021-09-16 | DRG: 377 | Disposition: A | Discharge status: Home / Self Care | Payer: Medicare Other | Attending: Internal Medicine | Admitting: Internal Medicine

## 2021-09-13 ENCOUNTER — Telehealth: Payer: Self-pay | Admitting: Family

## 2021-09-13 ENCOUNTER — Other Ambulatory Visit: Payer: Self-pay

## 2021-09-13 ENCOUNTER — Emergency Department (HOSPITAL_COMMUNITY): Payer: Medicare Other

## 2021-09-13 DIAGNOSIS — H3530 Unspecified macular degeneration: Secondary | ICD-10-CM | POA: Diagnosis present

## 2021-09-13 DIAGNOSIS — Z7982 Long term (current) use of aspirin: Secondary | ICD-10-CM

## 2021-09-13 DIAGNOSIS — Z85118 Personal history of other malignant neoplasm of bronchus and lung: Secondary | ICD-10-CM

## 2021-09-13 DIAGNOSIS — E1165 Type 2 diabetes mellitus with hyperglycemia: Secondary | ICD-10-CM | POA: Diagnosis present

## 2021-09-13 DIAGNOSIS — R195 Other fecal abnormalities: Secondary | ICD-10-CM | POA: Diagnosis not present

## 2021-09-13 DIAGNOSIS — C3490 Malignant neoplasm of unspecified part of unspecified bronchus or lung: Secondary | ICD-10-CM | POA: Diagnosis not present

## 2021-09-13 DIAGNOSIS — Z923 Personal history of irradiation: Secondary | ICD-10-CM | POA: Diagnosis not present

## 2021-09-13 DIAGNOSIS — J449 Chronic obstructive pulmonary disease, unspecified: Secondary | ICD-10-CM | POA: Diagnosis present

## 2021-09-13 DIAGNOSIS — E1140 Type 2 diabetes mellitus with diabetic neuropathy, unspecified: Secondary | ICD-10-CM | POA: Diagnosis not present

## 2021-09-13 DIAGNOSIS — I509 Heart failure, unspecified: Secondary | ICD-10-CM | POA: Diagnosis not present

## 2021-09-13 DIAGNOSIS — Z882 Allergy status to sulfonamides status: Secondary | ICD-10-CM | POA: Diagnosis not present

## 2021-09-13 DIAGNOSIS — I472 Ventricular tachycardia, unspecified: Secondary | ICD-10-CM | POA: Diagnosis not present

## 2021-09-13 DIAGNOSIS — D62 Acute posthemorrhagic anemia: Secondary | ICD-10-CM | POA: Diagnosis not present

## 2021-09-13 DIAGNOSIS — E1151 Type 2 diabetes mellitus with diabetic peripheral angiopathy without gangrene: Secondary | ICD-10-CM | POA: Diagnosis present

## 2021-09-13 DIAGNOSIS — K5521 Angiodysplasia of colon with hemorrhage: Secondary | ICD-10-CM | POA: Diagnosis not present

## 2021-09-13 DIAGNOSIS — I480 Paroxysmal atrial fibrillation: Secondary | ICD-10-CM | POA: Diagnosis present

## 2021-09-13 DIAGNOSIS — E119 Type 2 diabetes mellitus without complications: Secondary | ICD-10-CM | POA: Diagnosis present

## 2021-09-13 DIAGNOSIS — K5730 Diverticulosis of large intestine without perforation or abscess without bleeding: Secondary | ICD-10-CM | POA: Diagnosis present

## 2021-09-13 DIAGNOSIS — K5731 Diverticulosis of large intestine without perforation or abscess with bleeding: Secondary | ICD-10-CM | POA: Diagnosis not present

## 2021-09-13 DIAGNOSIS — K3189 Other diseases of stomach and duodenum: Secondary | ICD-10-CM | POA: Diagnosis not present

## 2021-09-13 DIAGNOSIS — Z7901 Long term (current) use of anticoagulants: Secondary | ICD-10-CM

## 2021-09-13 DIAGNOSIS — E782 Mixed hyperlipidemia: Secondary | ICD-10-CM | POA: Diagnosis present

## 2021-09-13 DIAGNOSIS — J441 Chronic obstructive pulmonary disease with (acute) exacerbation: Secondary | ICD-10-CM | POA: Diagnosis not present

## 2021-09-13 DIAGNOSIS — R0600 Dyspnea, unspecified: Secondary | ICD-10-CM | POA: Diagnosis present

## 2021-09-13 DIAGNOSIS — N400 Enlarged prostate without lower urinary tract symptoms: Secondary | ICD-10-CM | POA: Diagnosis present

## 2021-09-13 DIAGNOSIS — Z9221 Personal history of antineoplastic chemotherapy: Secondary | ICD-10-CM | POA: Diagnosis not present

## 2021-09-13 DIAGNOSIS — I5033 Acute on chronic diastolic (congestive) heart failure: Secondary | ICD-10-CM | POA: Diagnosis not present

## 2021-09-13 DIAGNOSIS — I152 Hypertension secondary to endocrine disorders: Secondary | ICD-10-CM | POA: Diagnosis not present

## 2021-09-13 DIAGNOSIS — Z8711 Personal history of peptic ulcer disease: Secondary | ICD-10-CM | POA: Diagnosis not present

## 2021-09-13 DIAGNOSIS — C3491 Malignant neoplasm of unspecified part of right bronchus or lung: Secondary | ICD-10-CM | POA: Diagnosis present

## 2021-09-13 DIAGNOSIS — I5030 Unspecified diastolic (congestive) heart failure: Secondary | ICD-10-CM | POA: Diagnosis present

## 2021-09-13 DIAGNOSIS — D649 Anemia, unspecified: Secondary | ICD-10-CM | POA: Diagnosis present

## 2021-09-13 DIAGNOSIS — K922 Gastrointestinal hemorrhage, unspecified: Secondary | ICD-10-CM | POA: Diagnosis present

## 2021-09-13 DIAGNOSIS — I110 Hypertensive heart disease with heart failure: Secondary | ICD-10-CM | POA: Diagnosis not present

## 2021-09-13 DIAGNOSIS — Z888 Allergy status to other drugs, medicaments and biological substances status: Secondary | ICD-10-CM | POA: Diagnosis not present

## 2021-09-13 DIAGNOSIS — I4892 Unspecified atrial flutter: Secondary | ICD-10-CM | POA: Diagnosis present

## 2021-09-13 DIAGNOSIS — K250 Acute gastric ulcer with hemorrhage: Secondary | ICD-10-CM | POA: Diagnosis not present

## 2021-09-13 DIAGNOSIS — G4733 Obstructive sleep apnea (adult) (pediatric): Secondary | ICD-10-CM | POA: Diagnosis present

## 2021-09-13 DIAGNOSIS — E1169 Type 2 diabetes mellitus with other specified complication: Secondary | ICD-10-CM | POA: Diagnosis present

## 2021-09-13 DIAGNOSIS — K5520 Angiodysplasia of colon without hemorrhage: Secondary | ICD-10-CM | POA: Diagnosis not present

## 2021-09-13 DIAGNOSIS — D509 Iron deficiency anemia, unspecified: Secondary | ICD-10-CM | POA: Diagnosis not present

## 2021-09-13 DIAGNOSIS — E039 Hypothyroidism, unspecified: Secondary | ICD-10-CM | POA: Diagnosis present

## 2021-09-13 DIAGNOSIS — Z8249 Family history of ischemic heart disease and other diseases of the circulatory system: Secondary | ICD-10-CM

## 2021-09-13 DIAGNOSIS — Z79890 Hormone replacement therapy (postmenopausal): Secondary | ICD-10-CM

## 2021-09-13 DIAGNOSIS — Z7951 Long term (current) use of inhaled steroids: Secondary | ICD-10-CM

## 2021-09-13 DIAGNOSIS — K2289 Other specified disease of esophagus: Secondary | ICD-10-CM | POA: Diagnosis not present

## 2021-09-13 DIAGNOSIS — R531 Weakness: Secondary | ICD-10-CM

## 2021-09-13 DIAGNOSIS — Z881 Allergy status to other antibiotic agents status: Secondary | ICD-10-CM

## 2021-09-13 DIAGNOSIS — F17210 Nicotine dependence, cigarettes, uncomplicated: Secondary | ICD-10-CM | POA: Diagnosis present

## 2021-09-13 DIAGNOSIS — Z79899 Other long term (current) drug therapy: Secondary | ICD-10-CM

## 2021-09-13 DIAGNOSIS — I739 Peripheral vascular disease, unspecified: Secondary | ICD-10-CM | POA: Diagnosis present

## 2021-09-13 DIAGNOSIS — Z833 Family history of diabetes mellitus: Secondary | ICD-10-CM

## 2021-09-13 LAB — CBC WITH DIFFERENTIAL/PLATELET
Abs Immature Granulocytes: 0.03 10*3/uL (ref 0.00–0.07)
Basophils Absolute: 0.1 10*3/uL (ref 0.0–0.2)
Basophils Absolute: 0.1 10*3/uL (ref 0.0–0.1)
Basophils Relative: 1 %
Basos: 1 %
EOS (ABSOLUTE): 0.2 10*3/uL (ref 0.0–0.4)
Eos: 2 %
Eosinophils Absolute: 0.1 10*3/uL (ref 0.0–0.5)
Eosinophils Relative: 1 %
HCT: 30.9 % — ABNORMAL LOW (ref 39.0–52.0)
Hematocrit: 25.3 % — ABNORMAL LOW (ref 37.5–51.0)
Hemoglobin: 7.8 g/dL — CL (ref 13.0–17.7)
Hemoglobin: 8.6 g/dL — ABNORMAL LOW (ref 13.0–17.0)
Immature Grans (Abs): 0 10*3/uL (ref 0.0–0.1)
Immature Granulocytes: 0 %
Immature Granulocytes: 0 %
Lymphocytes Absolute: 0.9 10*3/uL (ref 0.7–3.1)
Lymphocytes Relative: 7 %
Lymphs Abs: 0.7 10*3/uL (ref 0.7–4.0)
Lymphs: 9 %
MCH: 32.8 pg (ref 26.6–33.0)
MCH: 33.3 pg (ref 26.0–34.0)
MCHC: 30.8 g/dL — ABNORMAL LOW (ref 31.5–35.7)
MCHC: 27.8 g/dL — ABNORMAL LOW (ref 30.0–36.0)
MCV: 106 fL — ABNORMAL HIGH (ref 79–97)
MCV: 119.8 fL — ABNORMAL HIGH (ref 80.0–100.0)
Monocytes Absolute: 1 10*3/uL — ABNORMAL HIGH (ref 0.1–0.9)
Monocytes Absolute: 0.9 10*3/uL (ref 0.1–1.0)
Monocytes Relative: 9 %
Monocytes: 10 %
Neutro Abs: 7.8 10*3/uL — ABNORMAL HIGH (ref 1.7–7.7)
Neutrophils Absolute: 7.7 10*3/uL — ABNORMAL HIGH (ref 1.4–7.0)
Neutrophils Relative %: 82 %
Neutrophils: 78 %
Platelets: 281 10*3/uL (ref 150–450)
Platelets: 266 10*3/uL (ref 150–400)
RBC: 2.38 x10E6/uL — CL (ref 4.14–5.80)
RBC: 2.58 MIL/uL — ABNORMAL LOW (ref 4.22–5.81)
RDW: 13.4 % (ref 11.6–15.4)
RDW: 15.5 % (ref 11.5–15.5)
WBC: 9.9 10*3/uL (ref 3.4–10.8)
WBC: 9.6 10*3/uL (ref 4.0–10.5)
nRBC: 0.3 % — ABNORMAL HIGH (ref 0.0–0.2)

## 2021-09-13 LAB — BASIC METABOLIC PANEL
Anion gap: 14 (ref 5–15)
BUN: 32 mg/dL — ABNORMAL HIGH (ref 8–23)
CO2: 27 mmol/L (ref 22–32)
Calcium: 9.2 mg/dL (ref 8.9–10.3)
Chloride: 97 mmol/L — ABNORMAL LOW (ref 98–111)
Creatinine, Ser: 1.14 mg/dL (ref 0.61–1.24)
GFR, Estimated: >60 mL/min (ref >60)
Glucose, Bld: 147 mg/dL — ABNORMAL HIGH (ref 70–99)
Potassium: 4.3 mmol/L (ref 3.5–5.1)
Sodium: 138 mmol/L (ref 135–145)

## 2021-09-13 LAB — POC OCCULT BLOOD, ED: Fecal Occult Bld: POSITIVE — AB

## 2021-09-13 LAB — BRAIN NATRIURETIC PEPTIDE: B Natriuretic Peptide: 267.8 pg/mL — ABNORMAL HIGH (ref 0.0–100.0)

## 2021-09-13 MED ORDER — SODIUM CHLORIDE 0.9 % IV BOLUS
1000.0000 mL | Freq: Once | INTRAVENOUS | Status: AC
  Administered 2021-09-13: 1000 mL via INTRAVENOUS

## 2021-09-13 MED ORDER — IOHEXOL 300 MG/ML  SOLN
100.0000 mL | Freq: Once | INTRAMUSCULAR | Status: AC | PRN
  Administered 2021-09-13: 100 mL via INTRAVENOUS

## 2021-09-13 MED ORDER — ALBUTEROL SULFATE (2.5 MG/3ML) 0.083% IN NEBU
2.5000 mg | INHALATION_SOLUTION | Freq: Once | RESPIRATORY_TRACT | Status: AC
Start: 2021-09-13 — End: 2021-09-13
  Administered 2021-09-13: 2.5 mg via RESPIRATORY_TRACT
  Filled 2021-09-13: qty 3

## 2021-09-13 NOTE — ED Provider Triage Note (Signed)
Emergency Medicine Provider Triage Evaluation Note  John [name] , a 76 y.o. male  was evaluated in triage.  Pt complains of low hemoglobin.  Patient was sent due to low hemoglobin and hypotensive.  Systolic around 72B.  Has a known bleeding ulcer.  On Eliquis.  He is unsure of his hemoglobin level Review of Systems  Positive: Fatigue Negative: Syncope  Physical Exam  BP (!) 111/51   Pulse 81   Temp 97.8 F (36.6 C) (Oral)   Resp 16   SpO2 95%  Gen:   Awake, no distress   Resp:  Normal effort  MSK:   Moves extremities without difficulty  Other:  Pallor jaundice  Medical Decision Making  Medically screening exam initiated at 3:24 PM.  Appropriate orders placed.  John [name] was informed that the remainder of the evaluation will be completed by another provider, this initial triage assessment does not replace that evaluation, and the importance of remaining in the ED until their evaluation is complete.     Rhae Hammock, PA-C 09/13/21 1526

## 2021-09-13 NOTE — ED Provider Notes (Signed)
[facility] EMERGENCY DEPARTMENT Provider Note   CSN: [id number] Arrival date & time: 09/13/21  1404     History  Chief Complaint  Patient presents with   Abnormal Lab    John [name] is a 76 y.o. male.  Patient presents to the hospital complaining of generalized weakness and a low hemoglobin level as reported by his primary care provider.  The patient was contacted this morning and told that he had a critical low hemoglobin value of 7.8.  He states that for the past 1 to 2 weeks he has been coming increasingly weak.  He feels tired and cold.  Denies any knowledge of blood in stool, or bloody emesis.  No nausea, vomiting, diarrhea.  Does endorse generalized abdominal pain.  Denies chest pain, shortness of breath at this time.  Past medical history significant for CHF, COPD, PVD, adenocarcinoma of lung, right, DM type II, non-small cell lung cancer, right, neuropathy  Patient states that he had a colonoscopy on March 16.  Patient had upper endoscopy last June  HPI     Home Medications Prior to Admission medications   Medication Sig Start Date End Date Taking? Authorizing Provider  acetaminophen (TYLENOL) 650 MG CR tablet Take 1,300 mg by mouth every 8 (eight) hours as needed for pain.    [provider]  albuterol (PROVENTIL) (2.5 MG/3ML) 0.083% nebulizer solution Take 3 mLs (2.5 mg total) by nebulization every 4 (four) hours as needed for wheezing or shortness of breath (if not able to take inhaler). 08/27/21 08/27/22  Geradine Girt, DO  albuterol (VENTOLIN HFA) 108 (90 Base) MCG/ACT inhaler Inhale 2 puffs into the lungs every 4 (four) hours as needed for wheezing or shortness of breath. 03/05/21   [name], FNP  apixaban (ELIQUIS) 5 MG TABS tablet TAKE 1 TABLET BY MOUTH TWICE A DAY Patient taking differently: Take 5 mg by mouth 2 (two) times daily. 04/16/21   O'NealCassie Freer, MD  aspirin EC 81 MG tablet Take 81 mg by mouth daily. Swallow  whole.    [provider]  Blood Glucose Monitoring Suppl (ONETOUCH VERIO REFLECT) w/Device KIT Use to test blood sugars daily as directed. DX: E11.9 10/14/19   Evelina Dun A, FNP  Cyanocobalamin (B-12 PO) Take 1 tablet by mouth daily.    [provider]  doxycycline (VIBRA-TABS) 100 MG tablet Take 1 tablet (100 mg total) by mouth 2 (two) times daily. 09/06/21   Evelina Dun A, FNP  fluticasone (FLONASE) 50 MCG/ACT nasal spray PLACE 1 SPRAY INTO BOTH NOSTRILS DAILY AS NEEDED FOR ALLERGIES OR RHINITIS. 04/17/21   Evelina Dun A, FNP  Fluticasone-Umeclidin-Vilant (TRELEGY ELLIPTA) 100-62.5-25 MCG/INH AEPB Inhale 1 puff into the lungs daily. 11/08/20   Tanda Rockers, MD  gabapentin (NEURONTIN) 300 MG capsule TAKE 1 CAPSULE BY MOUTH THREE TIMES A DAY Patient taking differently: Take 300-600 mg by mouth See admin instructions. Taking 300 mg in the afternoon and 600 mg at bedtime 07/16/21   Evelina Dun A, FNP  Iron, Ferrous Sulfate, 325 (65 Fe) MG TABS Take 325 mg by mouth 2 (two) times daily. Patient taking differently: Take 325 mg by mouth daily. 04/23/21   Hawks, Christy A, FNP  KLOR-CON M20 20 MEQ tablet TAKE 1 TABLET BY MOUTH EVERY DAY Patient taking differently: Take 20 mEq by mouth daily. 07/16/21   [name], FNP  levothyroxine (SYNTHROID) 50 MCG tablet TAKE 1 TABLET BY MOUTH EVERY DAY Patient taking differently:  Take 50 mcg by mouth daily before breakfast. 07/16/21   [name], FNP  linaclotide [facility]) 72 MCG capsule Take 1 capsule (72 mcg total) by mouth daily before breakfast. 09/10/21   Evelina Dun A, FNP  Melatonin 10 MG TABS Take 10 mg by mouth at bedtime as needed (sleep).    [provider]  nebivolol (BYSTOLIC) 2.5 MG tablet TAKE 1 TABLET BY MOUTH EVERY DAY Patient taking differently: Take 2.5 mg by mouth daily. 07/16/21   O'Neal, Cassie Freer, MD  nystatin (MYCOSTATIN) 100000 UNIT/ML suspension Take 5 mLs (500,000 Units total) by mouth 4  (four) times daily. 09/05/21   [name], FNP  ondansetron (ZOFRAN) 4 MG tablet Take 1 tablet (4 mg total) by mouth every 8 (eight) hours as needed for nausea or vomiting. 09/03/21   [name], FNP  OneTouch Delica Lancets 62G MISC Use to test blood sugars daily as directed. DX: E11.9 10/14/19   Hawks, [name], FNP  ONETOUCH VERIO test strip USE TO TEST BLOOD SUGARS DAILY AS DIRECTED. DX: E11.9 12/28/20   Evelina Dun A, FNP  pantoprazole (PROTONIX) 40 MG tablet Take 1 tablet (40 mg total) by mouth daily. 04/25/21 10/22/21  Derek Jack, MD  pravastatin (PRAVACHOL) 40 MG tablet TAKE 1 TABLET (40 MG TOTAL) BY MOUTH ON MONDAYS , WEDNESDAYS, AND FRIDAYS AS DIRECTED Patient taking differently: Take 40 mg by mouth 3 (three) times a week. 07/16/21   Hawks, Christy A, FNP  Semaglutide (RYBELSUS) 7 MG TABS Take 7 mg by mouth daily. 05/24/21   [name], FNP  torsemide (DEMADEX) 20 MG tablet Take 2 tablets (40 mg) in the morning, take 2 tablets (40 mg) in the evening daily as needed. 09/06/21   Geralynn Rile, MD  traZODone (DESYREL) 50 MG tablet Take 0.5 tablets (25 mg total) by mouth at bedtime. 09/06/21   O'NealCassie Freer, MD      Allergies    Sulfa antibiotics, Atorvastatin, Jardiance [empagliflozin], Lopressor [metoprolol], Rosuvastatin, Doxycycline, and Temazepam    Review of Systems   Review of Systems  Constitutional:  Positive for fatigue. Negative for fever.  Respiratory:  Positive for shortness of breath.   Cardiovascular:  Negative for chest pain.  Gastrointestinal:  Positive for abdominal pain. Negative for blood in stool, diarrhea, nausea and vomiting.  Genitourinary:  Negative for dysuria.  Skin:  Negative for pallor.  Neurological:  Positive for weakness.    Physical Exam Updated Vital Signs BP (!) 149/64   Pulse 83   Temp 97.8 F (36.6 C) (Oral)   Resp 18   SpO2 98%  Physical Exam Vitals and nursing note reviewed.  Constitutional:       Appearance: He is normal weight.     Comments: Patient is alert but "sleepy"  HENT:     Head: Normocephalic and atraumatic.     Mouth/Throat:     Mouth: Mucous membranes are moist.  Eyes:     Conjunctiva/sclera: Conjunctivae normal.  Cardiovascular:     Rate and Rhythm: Normal rate. Rhythm irregular.     Pulses: Normal pulses.  Pulmonary:     Effort: Pulmonary effort is normal.     Breath sounds: Wheezing present.     Comments: Scattered wheezes Abdominal:     Palpations: Abdomen is soft.     Tenderness: There is abdominal tenderness (Mild diffuse tenderness to palpation).  Musculoskeletal:        General: No swelling.     Cervical back:  Normal range of motion and neck supple.  Skin:    General: Skin is warm and dry.  Neurological:     Mental Status: He is alert.     ED Results / Procedures / Treatments   Labs (all labs ordered are listed, but only abnormal results are displayed) Labs Reviewed  CBC WITH DIFFERENTIAL/PLATELET - Abnormal; Notable for the following components:      Result Value   RBC 2.58 (*)    Hemoglobin 8.6 (*)    HCT 30.9 (*)    MCV 119.8 (*)    MCHC 27.8 (*)    nRBC 0.3 (*)    Neutro Abs 7.8 (*)    All other components within normal limits  BASIC METABOLIC PANEL - Abnormal; Notable for the following components:   Chloride 97 (*)    Glucose, Bld 147 (*)    BUN 32 (*)    All other components within normal limits  POC OCCULT BLOOD, ED - Abnormal; Notable for the following components:   Fecal Occult Bld POSITIVE (*)    All other components within normal limits  BRAIN NATRIURETIC PEPTIDE  TYPE AND SCREEN    EKG EKG Interpretation  Date/Time:  Friday September 13 2021 19:10:11 EDT Ventricular Rate:  83 PR Interval:    QRS Duration: 137 QT Interval:  488 QTC Calculation: 574 R Axis:   -5 Text Interpretation: Atrial flutter with predominant 4:1 AV block Right bundle branch block Inferior infarct, age indeterminate Lateral leads are also involved  Confirmed by Fredia Sorrow [phone]) on 09/13/2021 7:13:39 PM  Radiology CT Abdomen Pelvis W Contrast  Result Date: 09/13/2021 CLINICAL DATA:  Abdominal pain. Acute nonlocalized. Bleeding ulcer. Hypotension. Low hemoglobin. Non-small cell lung cancer. * Tracking Code: BO * EXAM: CT ABDOMEN AND PELVIS WITH CONTRAST TECHNIQUE: Multidetector CT imaging of the abdomen and pelvis was performed using the standard protocol following bolus administration of intravenous contrast. RADIATION DOSE REDUCTION: This exam was performed according to the departmental dose-optimization program which includes automated exposure control, adjustment of the mA and/or kV according to patient size and/or use of iterative reconstruction technique. CONTRAST:  111m OMNIPAQUE IOHEXOL 300 MG/ML  SOLN COMPARISON:  Chest CT 06/19/2021, PET-CT 08/13/2020 FINDINGS: Lower chest: 5 mm nodule in the LEFT lower lobe (image 1/4 present on comparison PET-CT scan from 08/26/2020. No change in size. New RIGHT pleural effusion compared to most recent CT chest Hepatobiliary: No focal hepatic lesion. No biliary duct dilatation. Common bile duct is normal. Pancreas: Pancreas is normal. No ductal dilatation. No pancreatic inflammation. Spleen: Normal spleen Adrenals/urinary tract: Adrenal glands normal. Kidneys ureters and bladder are unchanged. 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 No change from multiple comparison exams Stomach/Bowel: Stomach is normal. Duodenum small-bowel normal. Appendix normal. Ascending, transverse and descending colon normal. No evidence of gastrointestinal bleeding, inflammation, or infection the GI tract. Vascular/Lymphatic: Abdominal aorta is normal caliber with atherosclerotic calcification. There is no retroperitoneal or periportal lymphadenopathy. No pelvic lymphadenopathy. Reproductive: TURP defect in the prostate gland Other: No intraperitoneal free fluid. Musculoskeletal: No  aggressive osseous lesion. IMPRESSION: 1. No source of gastrointestinal bleeding identified. 2. No inflammation or infection identified in the GI tract. 3. New small RIGHT effusion. 4. Stable LEFT lobe pulmonary nodule. Electronically Signed   By: SSuzy BouchardM.D.   On: 09/13/2021 21:43   DG Chest 2 View  Result Date: 09/13/2021 CLINICAL DATA:  Weakness EXAM: CHEST - 2 VIEW COMPARISON:  None  Available. FINDINGS: Pulmonary insufflation is normal and symmetric. There is parenchymal scarring within the lung apices noted. Right perihilar pulmonary opacity appears more prominent and may reflect post radiation changes noted on CT examination of 06/19/2021, but is not optimally assessed on this examination. No pneumothorax or pleural effusion. Cardiac size within normal limits. Prominent mitral valve annular calcification noted. Pulmonary vascularity is normal. No acute bone abnormality. IMPRESSION: Progressive right posterior perihilar pulmonary opacity, possibly representing post radiation change. This could be better assessed with dedicated nonemergent contrast enhanced CT imaging No radiographic evidence of acute cardiopulmonary disease. Electronically Signed   By: Fidela Salisbury M.D.   On: 09/13/2021 19:38    Procedures Procedures    Medications Ordered in ED Medications  sodium chloride 0.9 % bolus 1,000 mL (0 mLs Intravenous Stopped 09/13/21 2137)  iohexol (OMNIPAQUE) 300 MG/ML solution 100 mL (100 mLs Intravenous Contrast Given 09/13/21 2129)  albuterol (PROVENTIL) (2.5 MG/3ML) 0.083% nebulizer solution 2.5 mg (2.5 mg Nebulization Given 09/13/21 2144)    ED Course/ Medical Decision Making/ A&P                           Medical Decision Making Amount and/or Complexity of Data Reviewed Labs: ordered. Radiology: ordered.   This patient presents to the ED for concern of weakness with low hemoglobin, this involves an extensive number of treatment options, and is a complaint that carries with it a  high risk of complications and morbidity.  The differential diagnosis includes symptomatic anemia, blood loss anemia, iron deficiency anemia, and others   Co morbidities that complicate the patient evaluation  History of iron deficiency anemia   Additional history obtained:  Additional history obtained from patient's wife External records from outside source obtained and reviewed including family medicine notes from the past week Colonoscopy on 06/20/2021 showed large angiodysplastic lesion without bleeding in the cecum.  Scattered diverticula in the entire colon. Upper endoscopy on September 06, 2020 showed tiny gastric ulcer without bleeding or stigmata.    Lab Tests:  I Ordered, and personally interpreted labs.  The pertinent results include: Point-of-care occult blood positive, hemoglobin 8.6   Imaging Studies ordered:  I ordered imaging studies including chest x-ray and CT abdomen pelvis I independently visualized and interpreted imaging which showed Progressive right posterior perihilar pulmonary opacity, possibly representing post radiation change.  ,  Progressive right posterior perihilar pulmonary opacity, possibly representing post radiation change.   I agree with the radiologist interpretation   Cardiac Monitoring: / EKG:  The patient was maintained on a cardiac monitor.  I personally viewed and interpreted the cardiac monitored which showed an underlying rhythm of: Atrial flutter   Consultations Obtained:  I requested consultation with the hospitalist, Dr.Patel and discussed lab and imaging findings as well as pertinent plan - they recommend: admission to the hospital   Problem List / ED Course / Critical interventions / Medication management   I ordered medication including albuterol for wheezes Reevaluation of the patient after these medicines showed that the patient improved I have reviewed the patients home medicines and have made adjustments as needed   Test  / Admission - Considered:  The patient appears to have symptomatic anemia.  At this point I believe that admission would be recommended due to the patient's ongoing weakness.  Further work-up needed to try to find source of bleed.  Admit to hospital        Final Clinical Impression(s) / ED Diagnoses  Final diagnoses:  Symptomatic anemia  Gastrointestinal hemorrhage, unspecified gastrointestinal hemorrhage type  Weakness    Rx / DC Orders ED Discharge Orders     None         Ronny Bacon 09/13/21 2230    Fredia Sorrow, MD 09/16/21 1104

## 2021-09-13 NOTE — H&P (Incomplete)
History and Physical    John [name]:[id number] DOB: [birth year] DOA: 09/13/2021  PCP: [name], FNP  Patient coming from: Home  I have personally briefly reviewed patient's old medical records in [place]  Chief Complaint: Dyspnea  HPI: John [name] is a 76 y.o. male with medical history significant for paroxysmal A-fib/flutter on Eliquis, HFpEF (last EF 60-65% 08/21/2020), COPD, PAD (s/p right femoral endarterectomy, right common iliac stenting), stage IIIa non-small cell lung cancer (s/p chemo and radiation therapy), T2DM, HTN, HLD, hypothyroidism, OSA on CPAP who presented to the ED for evaluation of dyspnea and anemia.  Patient reports 1-2 weeks of progressive fatigue, exertional dyspnea, feeling cold.  His significant other notes that he has been appearing more pale than usual.  He has been taking his torsemide but reports decreased urine output than expected.  He has seen some swelling in his legs.  He was seen by his PCP and labs were obtained on 6/8 which resulted with a low hemoglobin of 7.8.  He was called to present to the ED for further evaluation.  He has not seen any red blood per rectum or in his stool.  He notes dark stools but attributes this to iron supplementation.  He has not seen any other obvious bleeding.  He does take Eliquis and aspirin.  ED Course  Labs/Imaging on admission: I have personally reviewed following labs and imaging studies.  Initial vitals showed BP 111/51, pulse 81, RR 16, temp 97.8 F, SPO2 95% on room air.  Labs show WBC 9.6, hemoglobin 8.6, platelets 266,000, sodium 138, potassium 4.3, bicarb 27, BUN 32, creatinine 1.14, serum glucose 147, BNP 267.8.  FOBT is positive.  2 view chest x-ray shows progressive right posterior perihilar pulmonary opacity, possibly representing postradiation change.  CT abdomen/pelvis with contrast negative for source of GI bleeding.  No inflammation or infection identified in the GI tract.   New small right effusion and stable left lobe pulmonary nodule noted.  Patient was given 1 L normal saline, albuterol nebulizer.  The hospitalist service was consulted to admit for further evaluation and management.  Review of Systems: All systems reviewed and are negative except as documented in history of present illness above.   Past Medical History:  Diagnosis Date  . Adenocarcinoma of lung, right ([place]) 04/18/2016  . Anxiety   . Arthritis   . Asthma   . BPH (benign prostatic hyperplasia)    with urinary retention 02/06/20  . CHF (congestive heart failure) ([place])   . COPD (chronic obstructive pulmonary disease) ([place])   . Depression   . Diabetes mellitus, type II ([place])   . Dyspnea   . History of kidney stones   . History of radiation therapy    right lung 09/10/2020-09/17/2020   Dr Sondra Come  . Hyperlipidemia   . Hypertension   . Hypothyroidism   . Macular degeneration   . Neuropathy   . Non-small cell lung cancer, right ([place]) 04/18/2016  . Peripheral vascular disease ([place])   . Prostatitis   . Pulmonary nodule, left 07/16/2016  . Sleep apnea    cpap    Past Surgical History:  Procedure Laterality Date  . ABDOMINAL AORTOGRAM W/LOWER EXTREMITY Left 02/06/2020   Procedure: ABDOMINAL AORTOGRAM W/LOWER EXTREMITY;  Surgeon: Lorretta Harp, MD;  Location: [facility];  Service: Cardiovascular;  Laterality: Left;  . ABDOMINAL AORTOGRAM W/LOWER EXTREMITY N/A 05/30/2021   Procedure: ABDOMINAL AORTOGRAM W/LOWER EXTREMITY;  Surgeon: Marty Heck, MD;  Location: [facility];  Service: Cardiovascular;  Laterality: N/A;  . CARDIOVERSION N/A 10/05/2020   Procedure: CARDIOVERSION;  Surgeon: Geralynn Rile, MD;  Location: [facility];  Service: Cardiovascular;  Laterality: N/A;  . CATARACT EXTRACTION, BILATERAL Bilateral   . COLONOSCOPY WITH PROPOFOL N/A 06/20/2021   Procedure: COLONOSCOPY WITH PROPOFOL;  Surgeon: Irene Shipper, MD;  Location: [facility];  Service:  Endoscopy;  Laterality: N/A;  . ENDARTERECTOMY FEMORAL Right 08/20/2020   Procedure: ENDARTERECTOMY  RIGHT FEMORAL ARTERY;  Surgeon: Marty Heck, MD;  Location: [facility];  Service: Vascular;  Laterality: Right;  . ESOPHAGOGASTRODUODENOSCOPY (EGD) WITH PROPOFOL N/A 09/06/2020   Procedure: ESOPHAGOGASTRODUODENOSCOPY (EGD) WITH PROPOFOL;  Surgeon: Irene Shipper, MD;  Location: [facility];  Service: Endoscopy;  Laterality: N/A;  . INSERTION OF ILIAC STENT Right 08/20/2020   Procedure: RETROGRADE INSERTION OF RIGHT ILIAC STENT;  Surgeon: Marty Heck, MD;  Location: [facility];  Service: Vascular;  Laterality: Right;  . INTRAOPERATIVE ARTERIOGRAM Right 08/20/2020   Procedure: INTRA OPERATIVE ARTERIOGRAM ILIAC;  Surgeon: Marty Heck, MD;  Location: [facility];  Service: Vascular;  Laterality: Right;  . PATCH ANGIOPLASTY Right 08/20/2020   Procedure: PATCH ANGIOPLASTY RIGHT FEMORAL ARTERY;  Surgeon: Marty Heck, MD;  Location: [facility];  Service: Vascular;  Laterality: Right;  . PERIPHERAL VASCULAR BALLOON ANGIOPLASTY Right 05/30/2021   Procedure: PERIPHERAL VASCULAR BALLOON ANGIOPLASTY;  Surgeon: Marty Heck, MD;  Location: [facility];  Service: Cardiovascular;  Laterality: Right;  . PORTACATH PLACEMENT Left 06/13/2016   Procedure: INSERTION PORT-A-CATH;  Surgeon: Aviva Signs, MD;  Location: [facility];  Service: General;  Laterality: Left;  . TRANSURETHRAL RESECTION OF PROSTATE N/A 05/31/2020   Procedure: TRANSURETHRAL RESECTION OF THE PROSTATE (TURP);  Surgeon: Cleon Gustin, MD;  Location: [facility];  Service: Urology;  Laterality: N/A;  . VIDEO BRONCHOSCOPY WITH ENDOBRONCHIAL NAVIGATION N/A 05/28/2016   Procedure: VIDEO BRONCHOSCOPY WITH ENDOBRONCHIAL NAVIGATION;  Surgeon: [name], MD;  Location: [facility];  Service: Thoracic;  Laterality: N/A;  . VIDEO BRONCHOSCOPY WITH ENDOBRONCHIAL ULTRASOUND N/A 05/28/2016   Procedure: VIDEO BRONCHOSCOPY WITH ENDOBRONCHIAL  ULTRASOUND;  Surgeon: [name], MD;  Location: [facility];  Service: Thoracic;  Laterality: N/A;    Social History:  reports that he has been smoking cigarettes. He started smoking about 60 years ago. He has a 27.50 pack-year smoking history. He has never used smokeless tobacco. He reports that he does not drink alcohol and does not use drugs.  Allergies  Allergen Reactions  . Sulfa Antibiotics Swelling    Mouth swelling  . Atorvastatin Other (See Comments)    Muscle aches - tolerating Pravastatin 40 mg MWF  . Jardiance [Empagliflozin] Other (See Comments)    FEELS SLUGGISH, TIRED  . Lopressor [Metoprolol] Other (See Comments)    Fatigue  . Rosuvastatin Other (See Comments)    Muscle aches - tolerating Pravastatin 40 mg MWF  . Doxycycline Nausea Only  . Temazepam Other (See Comments)    Made insomnia worse     Family History  Problem Relation Age of Onset  . Hypertension Mother   . Diabetes Father   . Heart disease Father   . Stroke Father   . Hypertension Sister      Prior to Admission medications   Medication Sig Start Date End Date Taking? Authorizing Provider  acetaminophen (TYLENOL) 650 MG CR tablet Take 1,300 mg by mouth every 8 (eight) hours as needed for pain.  [provider]  albuterol (PROVENTIL) (2.5 MG/3ML) 0.083% nebulizer solution Take 3 mLs (2.5 mg total) by nebulization every 4 (four) hours as needed for wheezing or shortness of breath (if not able to take inhaler). 08/27/21 08/27/22  Geradine Girt, DO  albuterol (VENTOLIN HFA) 108 (90 Base) MCG/ACT inhaler Inhale 2 puffs into the lungs every 4 (four) hours as needed for wheezing or shortness of breath. 03/05/21   [name], FNP  apixaban (ELIQUIS) 5 MG TABS tablet TAKE 1 TABLET BY MOUTH TWICE A DAY Patient taking differently: Take 5 mg by mouth 2 (two) times daily. 04/16/21   O'NealCassie Freer, MD  aspirin EC 81 MG tablet Take 81 mg by mouth daily. Swallow whole.    [provider]  Blood Glucose Monitoring Suppl (ONETOUCH VERIO REFLECT) w/Device KIT Use to test blood sugars daily as directed. DX: E11.9 10/14/19   Evelina Dun A, FNP  Cyanocobalamin (B-12 PO) Take 1 tablet by mouth daily.    [provider]  doxycycline (VIBRA-TABS) 100 MG tablet Take 1 tablet (100 mg total) by mouth 2 (two) times daily. 09/06/21   Evelina Dun A, FNP  fluticasone (FLONASE) 50 MCG/ACT nasal spray PLACE 1 SPRAY INTO BOTH NOSTRILS DAILY AS NEEDED FOR ALLERGIES OR RHINITIS. 04/17/21   Evelina Dun A, FNP  Fluticasone-Umeclidin-Vilant (TRELEGY ELLIPTA) 100-62.5-25 MCG/INH AEPB Inhale 1 puff into the lungs daily. 11/08/20   Tanda Rockers, MD  gabapentin (NEURONTIN) 300 MG capsule TAKE 1 CAPSULE BY MOUTH THREE TIMES A DAY Patient taking differently: Take 300-600 mg by mouth See admin instructions. Taking 300 mg in the afternoon and 600 mg at bedtime 07/16/21   Evelina Dun A, FNP  Iron, Ferrous Sulfate, 325 (65 Fe) MG TABS Take 325 mg by mouth 2 (two) times daily. Patient taking differently: Take 325 mg by mouth daily. 04/23/21   Hawks, Christy A, FNP  KLOR-CON M20 20 MEQ tablet TAKE 1 TABLET BY MOUTH EVERY DAY Patient taking differently: Take 20 mEq by mouth daily. 07/16/21   [name], FNP  levothyroxine (SYNTHROID) 50 MCG tablet TAKE 1 TABLET BY MOUTH EVERY DAY Patient taking differently: Take 50 mcg by mouth daily before breakfast. 07/16/21   [name], FNP  linaclotide [facility]) 72 MCG capsule Take 1 capsule (72 mcg total) by mouth daily before breakfast. 09/10/21   [name], FNP  Melatonin 10 MG TABS Take 10 mg by mouth at bedtime as needed (sleep).    [provider]  nebivolol (BYSTOLIC) 2.5 MG tablet TAKE 1 TABLET BY MOUTH EVERY DAY Patient taking differently: Take 2.5 mg by mouth daily. 07/16/21   O'Neal, Cassie Freer, MD  nystatin (MYCOSTATIN) 100000 UNIT/ML suspension Take 5 mLs (500,000 Units total) by mouth 4 (four) times daily.  09/05/21   [name], FNP  ondansetron (ZOFRAN) 4 MG tablet Take 1 tablet (4 mg total) by mouth every 8 (eight) hours as needed for nausea or vomiting. 09/03/21   [name], FNP  OneTouch Delica Lancets 11B MISC Use to test blood sugars daily as directed. DX: E11.9 10/14/19   Hawks, [name], FNP  ONETOUCH VERIO test strip USE TO TEST BLOOD SUGARS DAILY AS DIRECTED. DX: E11.9 12/28/20   Evelina Dun A, FNP  pantoprazole (PROTONIX) 40 MG tablet Take 1 tablet (40 mg total) by mouth daily. 04/25/21 10/22/21  Derek Jack, MD  pravastatin (PRAVACHOL) 40 MG tablet TAKE 1 TABLET (40 MG TOTAL) BY MOUTH ON MONDAYS ,  WEDNESDAYS, AND FRIDAYS AS DIRECTED Patient taking differently: Take 40 mg by mouth 3 (three) times a week. 07/16/21   Hawks, Christy A, FNP  Semaglutide (RYBELSUS) 7 MG TABS Take 7 mg by mouth daily. 05/24/21   [name], FNP  torsemide (DEMADEX) 20 MG tablet Take 2 tablets (40 mg) in the morning, take 2 tablets (40 mg) in the evening daily as needed. 09/06/21   Geralynn Rile, MD  traZODone (DESYREL) 50 MG tablet Take 0.5 tablets (25 mg total) by mouth at bedtime. 09/06/21   Geralynn Rile, MD    Physical Exam: Vitals:   09/13/21 2230 09/13/21 2245 09/13/21 2300 09/13/21 2315  BP: 112/83 117/64 (!) 126/51 (!) 126/49  Pulse: (!) 133 84 84 84  Resp: 20 14 17 16   Temp:      TempSrc:      SpO2: 94% 100% 100% 100%   Constitutional: Resting in bed with head elevated, NAD, calm, comfortable Eyes: EOMI, lids and conjunctivae normal ENMT: Mucous membranes are moist. Posterior pharynx clear of any exudate or lesions.Normal dentition.  Neck: normal, supple, no masses. Respiratory: Bibasilar crackles. Normal respiratory effort. No accessory muscle use.  Cardiovascular: Irregular, no murmurs / rubs / gallops.  Trace bilateral lower extremity edema. 2+ pedal pulses. Abdomen: no tenderness, no masses palpated. No hepatosplenomegaly.  Musculoskeletal: no clubbing /  cyanosis. No joint deformity upper and lower extremities. Good ROM, no contractures. Normal muscle tone.  Skin: no rashes, lesions, ulcers. No induration Neurologic: Sensation intact. Strength 5/5 in all 4.  Psychiatric: Alert and oriented x 3. Normal mood.   EKG: Personally reviewed. Atrial flutter, 4:1 AV block, RBBB.  Similar to prior.  Assessment/Plan Principal Problem:   Dyspnea Active Problems:   Acute on chronic anemia   Paroxysmal atrial flutter (HCC)   (HFpEF) heart failure with preserved ejection fraction (HCC)   Type 2 diabetes mellitus with hyperglycemia, without long-term current use of insulin (HCC)   COPD (chronic obstructive pulmonary disease) ([place])   Hypertension associated with diabetes ([place])   Mixed diabetic hyperlipidemia associated with type 2 diabetes mellitus (HCC)   Hypothyroidism   Non-small cell lung cancer, right (HCC)   PAD (peripheral artery disease) (HCC)   Nicotine dependence, cigarettes, uncomplicated   Obstructive sleep apnea treated with continuous positive airway pressure (CPAP)   *** No notes on file *** Assessment and Plan: No notes have been filed under this hospital service. Service: Hospitalist DVT prophylaxis: SCDs Start: 09/14/21 0001 Code Status: Full code, confirmed with patient on admission. Family Communication: Discussed with significant other at bedside Disposition Plan: From home and likely discharge to home pending clinical progress Consults called: None Severity of Illness: The appropriate patient status for this patient is OBSERVATION. Observation status is judged to be reasonable and necessary in order to provide the required intensity of service to ensure the patient's safety. The patient's presenting symptoms, physical exam findings, and initial radiographic and laboratory data in the context of their medical condition is felt to place them at decreased risk for further clinical deterioration. Furthermore, it is anticipated  that the patient will be medically stable for discharge from the hospital within 2 midnights of admission.   Zada Finders MD [facility]  If 7PM-7AM, please contact night-coverage [email or web address]  09/13/2021, 11:57 PM

## 2021-09-13 NOTE — H&P (Signed)
History and Physical    John [name] MRN:[id number] DOB: 01/06/1946 DOA: 09/13/2021  PCP: Hawks, Christy A, FNP  Patient coming from: Home  I have personally briefly reviewed patient's old medical records in [place] [facility]  Chief Complaint: Dyspnea  HPI: John [name] is a 75 y.o. male with medical history significant for paroxysmal A-fib/flutter on Eliquis, HFpEF (last EF 60-65% 08/21/2020), COPD, PAD (s/p right femoral endarterectomy, right common iliac stenting), stage IIIa non-small cell lung cancer (s/p chemo and radiation therapy), T2DM, HTN, HLD, hypothyroidism, OSA on CPAP who presented to the ED for evaluation of dyspnea and anemia.  Patient reports 1-2 weeks of progressive fatigue, exertional dyspnea, feeling cold.  His significant other notes that he has been appearing more pale than usual.  He has been taking his torsemide but reports decreased urine output than expected.  He has seen some swelling in his legs.  He was seen by his PCP and labs were obtained on 6/8 which resulted with a low hemoglobin of 7.8.  He was called to present to the ED for further evaluation.  He has not seen any red blood per rectum or in his stool.  He notes dark stools but attributes this to iron supplementation.  He has not seen any other obvious bleeding.  He does take Eliquis and aspirin.  ED Course  Labs/Imaging on admission: I have personally reviewed following labs and imaging studies.  Initial vitals showed BP 111/51, pulse 81, RR 16, temp 97.8 F, SPO2 95% on room air.  Labs show WBC 9.6, hemoglobin 8.6, platelets 266,000, sodium 138, potassium 4.3, bicarb 27, BUN 32, creatinine 1.14, serum glucose 147, BNP 267.8.  FOBT is positive.  2 view chest x-ray shows progressive right posterior perihilar pulmonary opacity, possibly representing postradiation change.  CT abdomen/pelvis with contrast negative for source of GI bleeding.  No inflammation or infection identified in the GI tract.   New small right effusion and stable left lobe pulmonary nodule noted.  Patient was given 1 L normal saline, albuterol nebulizer.  The hospitalist service was consulted to admit for further evaluation and management.  Review of Systems: All systems reviewed and are negative except as documented in history of present illness above.   Past Medical History:  Diagnosis Date   Adenocarcinoma of lung, right (HCC) 04/18/2016   Anxiety    Arthritis    Asthma    BPH (benign prostatic hyperplasia)    with urinary retention 02/06/20   CHF (congestive heart failure) (HCC)    COPD (chronic obstructive pulmonary disease) (HCC)    Depression    Diabetes mellitus, type II (HCC)    Dyspnea    History of kidney stones    History of radiation therapy    right lung 09/10/2020-09/17/2020   Dr Kinard   Hyperlipidemia    Hypertension    Hypothyroidism    Macular degeneration    Neuropathy    Non-small cell lung cancer, right (HCC) 04/18/2016   Peripheral vascular disease (HCC)    Prostatitis    Pulmonary nodule, left 07/16/2016   Sleep apnea    cpap    Past Surgical History:  Procedure Laterality Date   ABDOMINAL AORTOGRAM W/LOWER EXTREMITY Left 02/06/2020   Procedure: ABDOMINAL AORTOGRAM W/LOWER EXTREMITY;  Surgeon: Berry, Jonathan J, MD;  Location: [facility];  Service: Cardiovascular;  Laterality: Left;   ABDOMINAL AORTOGRAM W/LOWER EXTREMITY N/A 05/30/2021   Procedure: ABDOMINAL AORTOGRAM W/LOWER EXTREMITY;  Surgeon: Clark, Christopher J, MD;    Location: [place] [facility];  Service: Cardiovascular;  Laterality: N/A;   CARDIOVERSION N/A 10/05/2020   Procedure: CARDIOVERSION;  Surgeon: Geralynn Rile, MD;  Location: [place];  Service: Cardiovascular;  Laterality: N/A;   CATARACT EXTRACTION, BILATERAL Bilateral    COLONOSCOPY WITH PROPOFOL N/A 06/20/2021   Procedure: COLONOSCOPY WITH PROPOFOL;  Surgeon: Irene Shipper, MD;  Location: [facility];  Service: Endoscopy;  Laterality:  N/A;   ENDARTERECTOMY FEMORAL Right 08/20/2020   Procedure: ENDARTERECTOMY  RIGHT FEMORAL ARTERY;  Surgeon: Marty Heck, MD;  Location: [place];  Service: Vascular;  Laterality: Right;   ESOPHAGOGASTRODUODENOSCOPY (EGD) WITH PROPOFOL N/A 09/06/2020   Procedure: ESOPHAGOGASTRODUODENOSCOPY (EGD) WITH PROPOFOL;  Surgeon: Irene Shipper, MD;  Location[facility];  Service: Endoscopy;  Laterality: N/A;   INSERTION OF ILIAC STENT Right 08/20/2020   Procedure: RETROGRADE INSERTION OF RIGHT ILIAC STENT;  Surgeon: Marty Heck, MD;  Location: [place];  Service: Vascular;  Laterality: Right;   INTRAOPERATIVE ARTERIOGRAM Right 08/20/2020   Procedure: INTRA OPERATIVE ARTERIOGRAM ILIAC;  Surgeon: Marty Heck, MD;  Location: [place];  Service: Vascular;  Laterality: Right;   PATCH ANGIOPLASTY Right 08/20/2020   Procedure: PATCH ANGIOPLASTY RIGHT FEMORAL ARTERY;  Surgeon: Marty Heck, MD;  Location: [place];  Service: Vascular;  Laterality: Right;   PERIPHERAL VASCULAR BALLOON ANGIOPLASTY Right 05/30/2021   Procedure: PERIPHERAL VASCULAR BALLOON ANGIOPLASTY;  Surgeon: Marty Heck, MD;  Location: [place] [facility];  Service: Cardiovascular;  Laterality: Right;   PORTACATH PLACEMENT Left 06/13/2016   Procedure: INSERTION PORT-A-CATH;  Surgeon: Aviva Signs, MD;  Location: [facility];  Service: General;  Laterality: Left;   TRANSURETHRAL RESECTION OF PROSTATE N/A 05/31/2020   Procedure: TRANSURETHRAL RESECTION OF THE PROSTATE (TURP);  Surgeon: Cleon Gustin, MD;  Location: [facility];  Service: Urology;  Laterality: N/A;   VIDEO BRONCHOSCOPY WITH ENDOBRONCHIAL NAVIGATION N/A 05/28/2016   Procedure: VIDEO BRONCHOSCOPY WITH ENDOBRONCHIAL NAVIGATION;  Surgeon: [name], MD;  Location: [place];  Service: Thoracic;  Laterality: N/A;   VIDEO BRONCHOSCOPY WITH ENDOBRONCHIAL ULTRASOUND N/A 05/28/2016   Procedure: VIDEO BRONCHOSCOPY WITH ENDOBRONCHIAL ULTRASOUND;  Surgeon: [name], MD;  Location: [place];  Service: Thoracic;  Laterality: N/A;    Social History:  reports that he has been smoking cigarettes. He started smoking about 60 years ago. He has a 27.50 pack-year smoking history. He has never used smokeless tobacco. He reports that he does not drink alcohol and does not use drugs.  Allergies  Allergen Reactions   Sulfa Antibiotics Swelling    Mouth swelling   Atorvastatin Other (See Comments)    Muscle aches - tolerating Pravastatin 40 mg MWF   Jardiance [Empagliflozin] Other (See Comments)    FEELS SLUGGISH, TIRED   Lopressor [Metoprolol] Other (See Comments)    Fatigue   Rosuvastatin Other (See Comments)    Muscle aches - tolerating Pravastatin 40 mg MWF   Doxycycline Nausea Only   Temazepam Other (See Comments)    Made insomnia worse     Family History  Problem Relation Age of Onset   Hypertension Mother    Diabetes Father    Heart disease Father    Stroke Father    Hypertension Sister      Prior to Admission medications   Medication Sig Start Date End Date Taking? Authorizing Provider  acetaminophen (TYLENOL) 650 MG CR tablet Take 1,300 mg by mouth every 8 (eight) hours as needed for pain.  [provider]  albuterol (PROVENTIL) (2.5 MG/3ML) 0.083% nebulizer solution Take 3 mLs (2.5 mg total) by nebulization every 4 (four) hours as needed for wheezing or shortness of breath (if not able to take inhaler). 08/27/21 08/27/22  Vann, Jessica U, DO  albuterol (VENTOLIN HFA) 108 (90 Base) MCG/ACT inhaler Inhale 2 puffs into the lungs every 4 (four) hours as needed for wheezing or shortness of breath. 03/05/21   Hawks, Christy A, FNP  apixaban (ELIQUIS) 5 MG TABS tablet TAKE 1 TABLET BY MOUTH TWICE A DAY Patient taking differently: Take 5 mg by mouth 2 (two) times daily. 04/16/21   O'Neal, [place] Thomas, MD  aspirin EC 81 MG tablet Take 81 mg by mouth daily. Swallow whole.    [provider]  Blood Glucose Monitoring Suppl  (ONETOUCH VERIO REFLECT) w/Device KIT Use to test blood sugars daily as directed. DX: E11.9 10/14/19   Hawks, Christy A, FNP  Cyanocobalamin (B-12 PO) Take 1 tablet by mouth daily.    [provider]  doxycycline (VIBRA-TABS) 100 MG tablet Take 1 tablet (100 mg total) by mouth 2 (two) times daily. 09/06/21   Hawks, Christy A, FNP  fluticasone (FLONASE) 50 MCG/ACT nasal spray PLACE 1 SPRAY INTO BOTH NOSTRILS DAILY AS NEEDED FOR ALLERGIES OR RHINITIS. 04/17/21   Hawks, Christy A, FNP  Fluticasone-Umeclidin-Vilant (TRELEGY ELLIPTA) 100-62.5-25 MCG/INH AEPB Inhale 1 puff into the lungs daily. 11/08/20   Wert, Michael B, MD  gabapentin (NEURONTIN) 300 MG capsule TAKE 1 CAPSULE BY MOUTH THREE TIMES A DAY Patient taking differently: Take 300-600 mg by mouth See admin instructions. Taking 300 mg in the afternoon and 600 mg at bedtime 07/16/21   Hawks, Christy A, FNP  Iron, Ferrous Sulfate, 325 (65 Fe) MG TABS Take 325 mg by mouth 2 (two) times daily. Patient taking differently: Take 325 mg by mouth daily. 04/23/21   Hawks, Christy A, FNP  KLOR-CON M20 20 MEQ tablet TAKE 1 TABLET BY MOUTH EVERY DAY Patient taking differently: Take 20 mEq by mouth daily. 07/16/21   Hawks, Christy A, FNP  levothyroxine (SYNTHROID) 50 MCG tablet TAKE 1 TABLET BY MOUTH EVERY DAY Patient taking differently: Take 50 mcg by mouth daily before breakfast. 07/16/21   Hawks, Christy A, FNP  linaclotide (LINZESS) 72 MCG capsule Take 1 capsule (72 mcg total) by mouth daily before breakfast. 09/10/21   Hawks, Christy A, FNP  Melatonin 10 MG TABS Take 10 mg by mouth at bedtime as needed (sleep).    [provider]  nebivolol (BYSTOLIC) 2.5 MG tablet TAKE 1 TABLET BY MOUTH EVERY DAY Patient taking differently: Take 2.5 mg by mouth daily. 07/16/21   O'Neal, [place] Thomas, MD  nystatin (MYCOSTATIN) 100000 UNIT/ML suspension Take 5 mLs (500,000 Units total) by mouth 4 (four) times daily. 09/05/21   Hawks, Christy A, FNP  ondansetron  (ZOFRAN) 4 MG tablet Take 1 tablet (4 mg total) by mouth every 8 (eight) hours as needed for nausea or vomiting. 09/03/21   Hawks, Christy A, FNP  OneTouch Delica Lancets 30G MISC Use to test blood sugars daily as directed. DX: E11.9 10/14/19   Hawks, Christy A, FNP  ONETOUCH VERIO test strip USE TO TEST BLOOD SUGARS DAILY AS DIRECTED. DX: E11.9 12/28/20   Hawks, Christy A, FNP  pantoprazole (PROTONIX) 40 MG tablet Take 1 tablet (40 mg total) by mouth daily. 04/25/21 10/22/21  Katragadda, Sreedhar, MD  pravastatin (PRAVACHOL) 40 MG tablet TAKE 1 TABLET (40 MG TOTAL) BY MOUTH ON MONDAYS ,   WEDNESDAYS, AND FRIDAYS AS DIRECTED Patient taking differently: Take 40 mg by mouth 3 (three) times a week. 07/16/21   Hawks, Christy A, FNP  Semaglutide (RYBELSUS) 7 MG TABS Take 7 mg by mouth daily. 05/24/21   [name], FNP  torsemide (DEMADEX) 20 MG tablet Take 2 tablets (40 mg) in the morning, take 2 tablets (40 mg) in the evening daily as needed. 09/06/21   Geralynn Rile, MD  traZODone (DESYREL) 50 MG tablet Take 0.5 tablets (25 mg total) by mouth at bedtime. 09/06/21   Geralynn Rile, MD    Physical Exam: Vitals:   09/13/21 2230 09/13/21 2245 09/13/21 2300 09/13/21 2315  BP: 112/83 117/64 (!) 126/51 (!) 126/49  Pulse: (!) 133 84 84 84  Resp: _0 Temp:      TempSrc:      SpO2: 94% 100% 100% 100%   Constitutional: Resting in bed with head elevated, NAD, calm, comfortable Eyes: EOMI, lids and conjunctivae normal ENMT: Mucous membranes are moist. Posterior pharynx clear of any exudate or lesions.Normal dentition.  Neck: normal, supple, no masses. Respiratory: Bibasilar crackles. Normal respiratory effort. No accessory muscle use.  Cardiovascular: Irregular, no murmurs / rubs / gallops.  Trace bilateral lower extremity edema. 2+ pedal pulses. Abdomen: no tenderness, no masses palpated. No hepatosplenomegaly.  Musculoskeletal: no clubbing / cyanosis. No joint deformity upper and lower  extremities. Good ROM, no contractures. Normal muscle tone.  Skin: no rashes, lesions, ulcers. No induration Neurologic: Sensation intact. Strength 5/5 in all 4.  Psychiatric: Alert and oriented x 3. Normal mood.   EKG: Personally reviewed. Atrial flutter, 4:1 AV block, RBBB.  Similar to prior.  Assessment/Plan Principal Problem:   Acute on chronic heart failure with preserved ejection fraction (HFpEF) (HCC) Active Problems:   Acute on chronic anemia   COPD (chronic obstructive pulmonary disease) (HCC)   Paroxysmal atrial flutter (HCC)   Type 2 diabetes mellitus with hyperglycemia, without long-term current use of insulin ([place])   Hypertension associated with diabetes ([place])   Mixed diabetic hyperlipidemia associated with type 2 diabetes mellitus (HCC)   Hypothyroidism   Non-small cell lung cancer, right ([place])   PAD (peripheral artery disease) (HCC)   Obstructive sleep apnea treated with continuous positive airway pressure (CPAP)   John [name] is a 76 y.o. male with medical history significant for paroxysmal A-fib/flutter on Eliquis, HFpEF (last EF 60-65% 08/21/2020), COPD, PAD (s/p right femoral endarterectomy, right common iliac stenting), stage IIIa non-small cell lung cancer (s/p chemo and radiation therapy), T2DM, HTN, HLD, hypothyroidism, OSA on CPAP who is admitted with acute on chronic HFpEF and anemia.  Assessment and Plan: * Acute on chronic heart failure with preserved ejection fraction (HFpEF) (HCC) Dyspnea likely multifactorial from acute on chronic HFpEF, acute on chronic anemia, and underlying COPD.  Appears mildly volume overloaded on admission.  BNP 267.8.  Last TTE 08/21/2020 showed EF 60-65%. -Start on IV Lasix 40 mg twice daily -Monitor strict I/O's and daily weights -Update echocardiogram  Acute on chronic anemia Baseline hemoglobin 10-11.  Recent low of 7.8 with PCP, improved to 8.6 on admission.  FOBT is positive.  Colonoscopy 06/20/2021 showed a single large  nonbleeding angiodysplastic lesion. Last EGD 09/06/2020 showed tiny gastric ulcer without bleeding. -Repeat CBC in a.m. -Hold Eliquis and aspirin for now -Start on IV Protonix 40 mg twice daily  Paroxysmal atrial flutter ([place]) Remains in atrial flutter with 4:1 block but otherwise controlled rate.  Continue nebivolol.  Holding Eliquis for now.  COPD (chronic obstructive pulmonary disease) (HCC) Distant breath sounds without significant wheezing on admission.  Continue Trelegy and albuterol as needed.  Type 2 diabetes mellitus with hyperglycemia, without long-term current use of insulin ([place]) Well-controlled with last A1c 5.4% 08/25/2021.  Hold home Rybelsus and placed on sensitive SSI.  Mixed diabetic hyperlipidemia associated with type 2 diabetes mellitus (HCC) Continue pravastatin.  Hypertension associated with diabetes ([place]) Continue nebivolol and IV Lasix as above.  Hypothyroidism Continue Synthroid.  Non-small cell lung cancer, right [facility]) S/p chemotherapy and radiation.  Follows with Dr. Delton Coombes.  CXR shows progressive right posterior perihilar pulmonary opacity, possibly representing postradiation change.  Considered nonemergent contrast-enhanced CT for better assessment.  PAD (peripheral artery disease) (HCC) Holding aspirin for now.  Continue statin.  Obstructive sleep apnea treated with continuous positive airway pressure (CPAP) Continue CPAP nightly.  DVT prophylaxis: SCDs Start: 09/14/21 0001 Code Status: Full code, confirmed with patient on admission. Family Communication: Discussed with significant other at bedside Disposition Plan: From home and likely discharge to home pending clinical progress Consults called: None Severity of Illness: The appropriate patient status for this patient is OBSERVATION. Observation status is judged to be reasonable and necessary in order to provide the required intensity of service to ensure the patient's safety. The patient's  presenting symptoms, physical exam findings, and initial radiographic and laboratory data in the context of their medical condition is felt to place them at decreased risk for further clinical deterioration. Furthermore, it is anticipated that the patient will be medically stable for discharge from the hospital within 2 midnights of admission.   Zada Finders MD [facility]  If 7PM-7AM, please contact night-coverage [email or web address]  09/14/2021, 12:13 AM

## 2021-09-13 NOTE — Telephone Encounter (Signed)
I recommend patient go to ED given drop of hgb, hx of GI bleed, increased SOB, and hypotension.

## 2021-09-13 NOTE — ED Triage Notes (Signed)
Pt reports hx bleeding ulcer, for which he's currently being treated. Went to PCP yesterday and was hypotensive and had low hgb results. Endorses generalized weakness. On eliquis and ASA daily.

## 2021-09-13 NOTE — Telephone Encounter (Signed)
Patience called with Critical lab result.  Hgb 7.8

## 2021-09-14 ENCOUNTER — Inpatient Hospital Stay (HOSPITAL_COMMUNITY): Payer: Medicare Other

## 2021-09-14 DIAGNOSIS — K3189 Other diseases of stomach and duodenum: Secondary | ICD-10-CM | POA: Diagnosis not present

## 2021-09-14 DIAGNOSIS — R195 Other fecal abnormalities: Secondary | ICD-10-CM

## 2021-09-14 DIAGNOSIS — N400 Enlarged prostate without lower urinary tract symptoms: Secondary | ICD-10-CM | POA: Diagnosis present

## 2021-09-14 DIAGNOSIS — J441 Chronic obstructive pulmonary disease with (acute) exacerbation: Secondary | ICD-10-CM | POA: Diagnosis present

## 2021-09-14 DIAGNOSIS — Z85118 Personal history of other malignant neoplasm of bronchus and lung: Secondary | ICD-10-CM | POA: Diagnosis not present

## 2021-09-14 DIAGNOSIS — K5731 Diverticulosis of large intestine without perforation or abscess with bleeding: Secondary | ICD-10-CM | POA: Diagnosis not present

## 2021-09-14 DIAGNOSIS — E1151 Type 2 diabetes mellitus with diabetic peripheral angiopathy without gangrene: Secondary | ICD-10-CM | POA: Diagnosis present

## 2021-09-14 DIAGNOSIS — I5033 Acute on chronic diastolic (congestive) heart failure: Secondary | ICD-10-CM | POA: Diagnosis present

## 2021-09-14 DIAGNOSIS — I472 Ventricular tachycardia, unspecified: Secondary | ICD-10-CM | POA: Diagnosis not present

## 2021-09-14 DIAGNOSIS — Z888 Allergy status to other drugs, medicaments and biological substances status: Secondary | ICD-10-CM | POA: Diagnosis not present

## 2021-09-14 DIAGNOSIS — Z7901 Long term (current) use of anticoagulants: Secondary | ICD-10-CM | POA: Diagnosis not present

## 2021-09-14 DIAGNOSIS — K2289 Other specified disease of esophagus: Secondary | ICD-10-CM | POA: Diagnosis not present

## 2021-09-14 DIAGNOSIS — I4892 Unspecified atrial flutter: Secondary | ICD-10-CM | POA: Diagnosis present

## 2021-09-14 DIAGNOSIS — K922 Gastrointestinal hemorrhage, unspecified: Secondary | ICD-10-CM | POA: Diagnosis present

## 2021-09-14 DIAGNOSIS — Z882 Allergy status to sulfonamides status: Secondary | ICD-10-CM | POA: Diagnosis not present

## 2021-09-14 DIAGNOSIS — E1140 Type 2 diabetes mellitus with diabetic neuropathy, unspecified: Secondary | ICD-10-CM | POA: Diagnosis present

## 2021-09-14 DIAGNOSIS — I110 Hypertensive heart disease with heart failure: Secondary | ICD-10-CM | POA: Diagnosis not present

## 2021-09-14 DIAGNOSIS — K5521 Angiodysplasia of colon with hemorrhage: Secondary | ICD-10-CM | POA: Diagnosis present

## 2021-09-14 DIAGNOSIS — D649 Anemia, unspecified: Secondary | ICD-10-CM | POA: Diagnosis present

## 2021-09-14 DIAGNOSIS — H3530 Unspecified macular degeneration: Secondary | ICD-10-CM | POA: Diagnosis present

## 2021-09-14 DIAGNOSIS — Z9221 Personal history of antineoplastic chemotherapy: Secondary | ICD-10-CM | POA: Diagnosis not present

## 2021-09-14 DIAGNOSIS — G4733 Obstructive sleep apnea (adult) (pediatric): Secondary | ICD-10-CM | POA: Diagnosis present

## 2021-09-14 DIAGNOSIS — E1169 Type 2 diabetes mellitus with other specified complication: Secondary | ICD-10-CM | POA: Diagnosis present

## 2021-09-14 DIAGNOSIS — Z8711 Personal history of peptic ulcer disease: Secondary | ICD-10-CM | POA: Diagnosis not present

## 2021-09-14 DIAGNOSIS — F17210 Nicotine dependence, cigarettes, uncomplicated: Secondary | ICD-10-CM | POA: Diagnosis present

## 2021-09-14 DIAGNOSIS — I152 Hypertension secondary to endocrine disorders: Secondary | ICD-10-CM | POA: Diagnosis present

## 2021-09-14 DIAGNOSIS — Z923 Personal history of irradiation: Secondary | ICD-10-CM | POA: Diagnosis not present

## 2021-09-14 DIAGNOSIS — D509 Iron deficiency anemia, unspecified: Secondary | ICD-10-CM | POA: Diagnosis not present

## 2021-09-14 DIAGNOSIS — I509 Heart failure, unspecified: Secondary | ICD-10-CM | POA: Diagnosis not present

## 2021-09-14 DIAGNOSIS — D62 Acute posthemorrhagic anemia: Secondary | ICD-10-CM | POA: Diagnosis present

## 2021-09-14 DIAGNOSIS — I480 Paroxysmal atrial fibrillation: Secondary | ICD-10-CM | POA: Diagnosis present

## 2021-09-14 DIAGNOSIS — E039 Hypothyroidism, unspecified: Secondary | ICD-10-CM | POA: Diagnosis present

## 2021-09-14 DIAGNOSIS — K5520 Angiodysplasia of colon without hemorrhage: Secondary | ICD-10-CM | POA: Diagnosis not present

## 2021-09-14 DIAGNOSIS — E782 Mixed hyperlipidemia: Secondary | ICD-10-CM | POA: Diagnosis present

## 2021-09-14 DIAGNOSIS — R531 Weakness: Secondary | ICD-10-CM | POA: Diagnosis present

## 2021-09-14 DIAGNOSIS — E1165 Type 2 diabetes mellitus with hyperglycemia: Secondary | ICD-10-CM | POA: Diagnosis present

## 2021-09-14 LAB — HEMOGLOBIN AND HEMATOCRIT, BLOOD
HCT: 28.7 % — ABNORMAL LOW (ref 39.0–52.0)
Hemoglobin: 8.2 g/dL — ABNORMAL LOW (ref 13.0–17.0)

## 2021-09-14 LAB — BASIC METABOLIC PANEL
Anion gap: 9 (ref 5–15)
BUN: 27 mg/dL — ABNORMAL HIGH (ref 8–23)
CO2: 31 mmol/L (ref 22–32)
Calcium: 8.6 mg/dL — ABNORMAL LOW (ref 8.9–10.3)
Chloride: 99 mmol/L (ref 98–111)
Creatinine, Ser: 1.16 mg/dL (ref 0.61–1.24)
GFR, Estimated: >60 mL/min (ref >60)
Glucose, Bld: 163 mg/dL — ABNORMAL HIGH (ref 70–99)
Potassium: 4 mmol/L (ref 3.5–5.1)
Sodium: 139 mmol/L (ref 135–145)

## 2021-09-14 LAB — GLUCOSE, CAPILLARY
Glucose-Capillary: 168 mg/dL — ABNORMAL HIGH (ref 70–99)
Glucose-Capillary: 162 mg/dL — ABNORMAL HIGH (ref 70–99)
Glucose-Capillary: 129 mg/dL — ABNORMAL HIGH (ref 70–99)
Glucose-Capillary: 268 mg/dL — ABNORMAL HIGH (ref 70–99)

## 2021-09-14 LAB — IRON AND TIBC
Iron: 21 ug/dL — ABNORMAL LOW (ref 45–182)
Saturation Ratios: 5 % — ABNORMAL LOW (ref 17.9–39.5)
TIBC: 441 ug/dL (ref 250–450)
UIBC: 420 ug/dL

## 2021-09-14 LAB — CBC
HCT: 23.9 % — ABNORMAL LOW (ref 39.0–52.0)
Hemoglobin: 7 g/dL — ABNORMAL LOW (ref 13.0–17.0)
MCH: 33.2 pg (ref 26.0–34.0)
MCHC: 29.3 g/dL — ABNORMAL LOW (ref 30.0–36.0)
MCV: 113.3 fL — ABNORMAL HIGH (ref 80.0–100.0)
Platelets: 249 10*3/uL (ref 150–400)
RBC: 2.11 MIL/uL — ABNORMAL LOW (ref 4.22–5.81)
RDW: 15.8 % — ABNORMAL HIGH (ref 11.5–15.5)
WBC: 8.2 10*3/uL (ref 4.0–10.5)
nRBC: 0 % (ref 0.0–0.2)

## 2021-09-14 LAB — PREPARE RBC (CROSSMATCH)

## 2021-09-14 LAB — FOLATE: Folate: 17.6 ng/mL (ref >5.9)

## 2021-09-14 LAB — FERRITIN: Ferritin: 30 ng/mL (ref 24–336)

## 2021-09-14 LAB — MAGNESIUM: Magnesium: 2 mg/dL (ref 1.7–2.4)

## 2021-09-14 LAB — VITAMIN B12: Vitamin B-12: 3634 pg/mL — ABNORMAL HIGH (ref 180–914)

## 2021-09-14 MED ORDER — SODIUM CHLORIDE 0.9 % IV SOLN: INTRAVENOUS | Status: DC

## 2021-09-14 MED ORDER — INSULIN ASPART 100 UNIT/ML IJ SOLN
0.0000 [IU] | Freq: Three times a day (TID) | INTRAMUSCULAR | Status: DC
  Administered 2021-09-14: 2 [IU] via SUBCUTANEOUS
  Administered 2021-09-15: 7 [IU] via SUBCUTANEOUS
  Administered 2021-09-15: 3 [IU] via SUBCUTANEOUS
  Administered 2021-09-15: 5 [IU] via SUBCUTANEOUS
  Administered 2021-09-16: 2 [IU] via SUBCUTANEOUS

## 2021-09-14 MED ORDER — NEBIVOLOL HCL 2.5 MG PO TABS
2.5000 mg | ORAL_TABLET | Freq: Every day | ORAL | Status: DC
  Administered 2021-09-14 – 2021-09-16 (×3): 2.5 mg via ORAL
  Filled 2021-09-14 (×3): qty 1

## 2021-09-14 MED ORDER — MELATONIN 3 MG PO TABS
3.0000 mg | ORAL_TABLET | Freq: Every evening | ORAL | Status: DC | PRN
  Administered 2021-09-15: 3 mg via ORAL
  Filled 2021-09-14: qty 1

## 2021-09-14 MED ORDER — SODIUM CHLORIDE 0.9% FLUSH
3.0000 mL | Freq: Two times a day (BID) | INTRAVENOUS | Status: DC
  Administered 2021-09-14 – 2021-09-16 (×5): 3 mL via INTRAVENOUS

## 2021-09-14 MED ORDER — SODIUM CHLORIDE 0.9 % IV SOLN
1000.0000 mg | Freq: Once | INTRAVENOUS | Status: AC
  Administered 2021-09-14: 1000 mg via INTRAVENOUS
  Filled 2021-09-14: qty 20

## 2021-09-14 MED ORDER — IPRATROPIUM-ALBUTEROL 0.5-2.5 (3) MG/3ML IN SOLN
3.0000 mL | Freq: Four times a day (QID) | RESPIRATORY_TRACT | Status: DC | PRN
  Administered 2021-09-14: 3 mL via RESPIRATORY_TRACT
  Filled 2021-09-14 (×3): qty 3

## 2021-09-14 MED ORDER — SODIUM CHLORIDE 0.9% FLUSH: 10.0000 mL | INTRAVENOUS | Status: DC | PRN

## 2021-09-14 MED ORDER — GABAPENTIN 300 MG PO CAPS
600.0000 mg | ORAL_CAPSULE | Freq: Every day | ORAL | Status: DC
  Administered 2021-09-14 – 2021-09-15 (×3): 600 mg via ORAL
  Filled 2021-09-14 (×3): qty 2

## 2021-09-14 MED ORDER — ACETAMINOPHEN 325 MG PO TABS
650.0000 mg | ORAL_TABLET | Freq: Four times a day (QID) | ORAL | Status: DC | PRN
  Administered 2021-09-14: 650 mg via ORAL
  Filled 2021-09-14: qty 2

## 2021-09-14 MED ORDER — UMECLIDINIUM BROMIDE 62.5 MCG/ACT IN AEPB
1.0000 | INHALATION_SPRAY | Freq: Every day | RESPIRATORY_TRACT | Status: DC
  Administered 2021-09-14 – 2021-09-16 (×3): 1 via RESPIRATORY_TRACT
  Filled 2021-09-14: qty 7

## 2021-09-14 MED ORDER — SODIUM CHLORIDE 0.9 % IV SOLN
25.0000 mg | Freq: Once | INTRAVENOUS | Status: AC
  Administered 2021-09-14: 25 mg via INTRAVENOUS
  Filled 2021-09-14 (×2): qty 0.5

## 2021-09-14 MED ORDER — SODIUM CHLORIDE 0.9% IV SOLUTION: Freq: Once | INTRAVENOUS | Status: AC

## 2021-09-14 MED ORDER — CHLORHEXIDINE GLUCONATE CLOTH 2 % EX PADS
6.0000 | MEDICATED_PAD | Freq: Every day | CUTANEOUS | Status: DC
  Administered 2021-09-14 – 2021-09-16 (×3): 6 via TOPICAL

## 2021-09-14 MED ORDER — LEVOTHYROXINE SODIUM 50 MCG PO TABS
50.0000 ug | ORAL_TABLET | Freq: Every day | ORAL | Status: DC
  Administered 2021-09-14 – 2021-09-16 (×3): 50 ug via ORAL
  Filled 2021-09-14 (×3): qty 1

## 2021-09-14 MED ORDER — FUROSEMIDE 10 MG/ML IJ SOLN
40.0000 mg | Freq: Two times a day (BID) | INTRAMUSCULAR | Status: DC
Start: 2021-09-14 — End: 2021-09-16
  Administered 2021-09-14 – 2021-09-16 (×5): 40 mg via INTRAVENOUS
  Filled 2021-09-14 (×5): qty 4

## 2021-09-14 MED ORDER — ALTEPLASE 2 MG IJ SOLR
2.0000 mg | Freq: Once | INTRAMUSCULAR | Status: AC
  Administered 2021-09-14: 2 mg
  Filled 2021-09-14: qty 2

## 2021-09-14 MED ORDER — METHYLPREDNISOLONE SODIUM SUCC 40 MG IJ SOLR
40.0000 mg | Freq: Two times a day (BID) | INTRAMUSCULAR | Status: DC
  Administered 2021-09-14 – 2021-09-16 (×5): 40 mg via INTRAVENOUS
  Filled 2021-09-14 (×5): qty 1

## 2021-09-14 MED ORDER — SENNOSIDES-DOCUSATE SODIUM 8.6-50 MG PO TABS
1.0000 | ORAL_TABLET | Freq: Every evening | ORAL | Status: DC | PRN
  Administered 2021-09-15: 1 via ORAL
  Filled 2021-09-14: qty 1

## 2021-09-14 MED ORDER — ACETAMINOPHEN 650 MG RE SUPP: 650.0000 mg | Freq: Four times a day (QID) | RECTAL | Status: DC | PRN

## 2021-09-14 MED ORDER — ONDANSETRON HCL 4 MG PO TABS
4.0000 mg | ORAL_TABLET | Freq: Four times a day (QID) | ORAL | Status: DC | PRN
  Administered 2021-09-14 – 2021-09-16 (×2): 4 mg via ORAL
  Filled 2021-09-14 (×2): qty 1

## 2021-09-14 MED ORDER — FLUTICASONE FUROATE-VILANTEROL 100-25 MCG/ACT IN AEPB
1.0000 | INHALATION_SPRAY | Freq: Every day | RESPIRATORY_TRACT | Status: DC
  Administered 2021-09-14 – 2021-09-16 (×3): 1 via RESPIRATORY_TRACT
  Filled 2021-09-14: qty 28

## 2021-09-14 MED ORDER — ALBUTEROL SULFATE (2.5 MG/3ML) 0.083% IN NEBU: 2.5000 mg | INHALATION_SOLUTION | RESPIRATORY_TRACT | Status: DC | PRN

## 2021-09-14 MED ORDER — PANTOPRAZOLE SODIUM 40 MG IV SOLR
40.0000 mg | Freq: Two times a day (BID) | INTRAVENOUS | Status: DC
Start: 2021-09-14 — End: 2021-09-16
  Administered 2021-09-14 – 2021-09-15 (×5): 40 mg via INTRAVENOUS
  Filled 2021-09-14 (×5): qty 10

## 2021-09-14 MED ORDER — ONDANSETRON HCL 4 MG/2ML IJ SOLN
4.0000 mg | Freq: Four times a day (QID) | INTRAMUSCULAR | Status: DC | PRN
  Administered 2021-09-15: 4 mg via INTRAVENOUS
  Filled 2021-09-14: qty 2

## 2021-09-14 MED ORDER — GABAPENTIN 300 MG PO CAPS
300.0000 mg | ORAL_CAPSULE | Freq: Every day | ORAL | Status: DC
  Administered 2021-09-14 – 2021-09-16 (×3): 300 mg via ORAL
  Filled 2021-09-14 (×5): qty 1

## 2021-09-14 MED ORDER — PRAVASTATIN SODIUM 40 MG PO TABS
40.0000 mg | ORAL_TABLET | ORAL | Status: DC
  Filled 2021-09-14: qty 1

## 2021-09-14 NOTE — Assessment & Plan Note (Signed)
Continue Synthroid °

## 2021-09-14 NOTE — Assessment & Plan Note (Addendum)
Baseline hemoglobin 10-11 g/dL -Patient endorsed dark stools but states he is also on oral iron at home - Eliquis and aspirin initially held -Hemoglobin improved after 1 unit PRBC on 09/14/2021 - Also received INFeD on 09/14/2021 for low iron stores -Hemoglobin 9.2 g/dL at discharge -No considerable findings on EGD and colonoscopy.  Gastric erythema found on EGD and small angiodysplastic lesion found on colonoscopy and treated with APC - continue PPI - resuming Eliquis on 6/13

## 2021-09-14 NOTE — Progress Notes (Signed)
Progress Note    John [name]   ACZ:[id number]  DOB: 11-23-1945  DOA: 09/13/2021     0 PCP: [name], FNP  Initial CC: SOB  Hospital Course: John [name] is a 76 y.o. male with PMH afib/flutter on Eliquis, HFpEF (last EF 60-65% 08/21/2020), COPD, PAD (s/p right femoral endarterectomy, right common iliac stenting), stage IIIa non-small cell lung cancer (s/p chemo and radiation therapy), T2DM, HTN, HLD, hypothyroidism, OSA on CPAP who was admitted with SOB.  There was concern for CHF exacerbation and possible contribution from underlying worsening anemia.  He was recently evaluated with outpatient blood work which revealed a low hemoglobin and he was recommended to present to the ER for further work-up. CT abdomen/pelvis was negative for obvious source of GI bleeding.  Small right pleural effusion was appreciated.  BNP was mildly elevated.  He was started on IV Lasix and admitted for further monitoring.  Interval History:  Breathing a little better when seen this morning. Endorsed dark stools at home but states also is on iron at home. Denied any hematemesis or similar. Denies any abdominal pain.   Assessment and Plan: * Acute on chronic heart failure with preserved ejection fraction (HFpEF) (HCC) - Dyspnea likely multifactorial from acute on chronic HFpEF, acute on chronic anemia, and underlying COPD.  Appeared mildly volume overloaded on admission.  BNP 267.8.  Last TTE 08/21/2020 showed EF 60-65%. - continue IV lasix; monitor output - follow up echo  Acute on chronic anemia Baseline hemoglobin 10-11 g/dL -Patient endorsed dark stools but states he is also on oral iron at home - Eliquis and aspirin are on hold for now - Hemoglobin is down trended to 7 g/dL this morning -Transfuse 1 unit PRBC - GI consulted as well, follow-up recommendations - Iron stores low, will replete as well - continue PPI  Paroxysmal atrial flutter ([place]) Remains in atrial flutter with 4:1 block  but otherwise controlled rate.  Continue nebivolol.  Holding Eliquis for now.  COPD (chronic obstructive pulmonary disease) (HCC) - Some wheezing appreciated on exam this morning - Change albuterol to DuoNeb's - Continue Trelegy - Start on steroids and monitor response  Type 2 diabetes mellitus with hyperglycemia, without long-term current use of insulin ([place]) Well-controlled with last A1c 5.4% 08/25/2021.  Hold home Rybelsus and placed on sensitive SSI.  Mixed diabetic hyperlipidemia associated with type 2 diabetes mellitus (HCC) Continue pravastatin.  Hypertension associated with diabetes ([place]) Continue nebivolol and IV Lasix as above.  Hypothyroidism Continue Synthroid.  Non-small cell lung cancer, right [facility]) S/p chemotherapy and radiation.  Follows with Dr. Delton Coombes.  CXR shows progressive right posterior perihilar pulmonary opacity, possibly representing postradiation change.  Considered nonemergent contrast-enhanced CT for better assessment.  PAD (peripheral artery disease) (HCC) Holding aspirin for now.  Continue statin.  Obstructive sleep apnea treated with continuous positive airway pressure (CPAP) Continue CPAP nightly.   Old records reviewed in assessment of this patient  Antimicrobials:   DVT prophylaxis:  SCDs Start: 09/14/21 0001   Code Status:   Code Status: Full Code  Disposition Plan:  Home in 1-2 days Status is: Inpt  Objective: Blood pressure (!) 126/56, pulse 83, temperature 97.8 F (36.6 C), temperature source Axillary, resp. rate 17, weight 74.8 kg, SpO2 100 %.  Examination:  Physical Exam Constitutional:      General: He is not in acute distress.    Appearance: Normal appearance.  HENT:     Head: Normocephalic and atraumatic.  Mouth/Throat:     Mouth: Mucous membranes are moist.  Eyes:     Extraocular Movements: Extraocular movements intact.  Cardiovascular:     Rate and Rhythm: Normal rate and regular rhythm.     Heart sounds:  Normal heart sounds.  Pulmonary:     Effort: Pulmonary effort is normal. No respiratory distress.     Comments: Mild wheezing appreciated bilaterally  Abdominal:     General: Bowel sounds are normal. There is no distension.     Palpations: Abdomen is soft.     Tenderness: There is no abdominal tenderness.  Musculoskeletal:        General: Normal range of motion.     Cervical back: Normal range of motion and neck supple.  Skin:    General: Skin is warm and dry.  Neurological:     General: No focal deficit present.     Mental Status: He is alert.  Psychiatric:        Mood and Affect: Mood normal.        Behavior: Behavior normal.      Consultants:  GI  Procedures:    Data Reviewed: Results for orders placed or performed during the hospital encounter of 09/13/21 (from the past 24 hour(s))  CBC with Differential     Status: Abnormal   Collection Time: 09/13/21  3:25 PM  Result Value Ref Range   WBC 9.6 4.0 - 10.5 K/uL   RBC 2.58 (L) 4.22 - 5.81 MIL/uL   Hemoglobin 8.6 (L) 13.0 - 17.0 g/dL   HCT 30.9 (L) 39.0 - 52.0 %   MCV 119.8 (H) 80.0 - 100.0 fL   MCH 33.3 26.0 - 34.0 pg   MCHC 27.8 (L) 30.0 - 36.0 g/dL   RDW 15.5 11.5 - 15.5 %   Platelets 266 150 - 400 K/uL   nRBC 0.3 (H) 0.0 - 0.2 %   Neutrophils Relative % 82 %   Neutro Abs 7.8 (H) 1.7 - 7.7 K/uL   Lymphocytes Relative 7 %   Lymphs Abs 0.7 0.7 - 4.0 K/uL   Monocytes Relative 9 %   Monocytes Absolute 0.9 0.1 - 1.0 K/uL   Eosinophils Relative 1 %   Eosinophils Absolute 0.1 0.0 - 0.5 K/uL   Basophils Relative 1 %   Basophils Absolute 0.1 0.0 - 0.1 K/uL   Immature Granulocytes 0 %   Abs Immature Granulocytes 0.03 0.00 - 0.07 K/uL  Basic metabolic panel     Status: Abnormal   Collection Time: 09/13/21  3:25 PM  Result Value Ref Range   Sodium 138 135 - 145 mmol/L   Potassium 4.3 3.5 - 5.1 mmol/L   Chloride 97 (L) 98 - 111 mmol/L   CO2 27 22 - 32 mmol/L   Glucose, Bld 147 (H) 70 - 99 mg/dL   BUN 32 (H) 8 -  23 mg/dL   Creatinine, Ser 1.14 0.61 - 1.24 mg/dL   Calcium 9.2 8.9 - 10.3 mg/dL   GFR, Estimated >60 >60 mL/min   Anion gap 14 5 - 15  POC occult blood, ED     Status: Abnormal   Collection Time: 09/13/21  7:40 PM  Result Value Ref Range   Fecal Occult Bld POSITIVE (A) NEGATIVE  Type and screen [place]     Status: None (Preliminary result)   Collection Time: 09/13/21  8:14 PM  Result Value Ref Range   ABO/RH(D) O POS    Antibody Screen NEG  Sample Expiration      09/16/2021,2359 Performed at [facility], [place] [address] [place], [place] [address]    Unit Number [id number]    Blood Component Type RED CELLS,LR    Unit division 00    Status of Unit ALLOCATED    Transfusion Status OK TO TRANSFUSE    Crossmatch Result Compatible   Brain natriuretic peptide     Status: Abnormal   Collection Time: 09/13/21  9:50 PM  Result Value Ref Range   B Natriuretic Peptide 267.8 (H) 0.0 - 100.0 pg/mL  Magnesium     Status: None   Collection Time: 09/14/21  7:06 AM  Result Value Ref Range   Magnesium 2.0 1.7 - 2.4 mg/dL  CBC     Status: Abnormal   Collection Time: 09/14/21  7:06 AM  Result Value Ref Range   WBC 8.2 4.0 - 10.5 K/uL   RBC 2.11 (L) 4.22 - 5.81 MIL/uL   Hemoglobin 7.0 (L) 13.0 - 17.0 g/dL   HCT 23.9 (L) 39.0 - 52.0 %   MCV 113.3 (H) 80.0 - 100.0 fL   MCH 33.2 26.0 - 34.0 pg   MCHC 29.3 (L) 30.0 - 36.0 g/dL   RDW 15.8 (H) 11.5 - 15.5 %   Platelets 249 150 - 400 K/uL   nRBC 0.0 0.0 - 0.2 %  Basic metabolic panel     Status: Abnormal   Collection Time: 09/14/21  7:06 AM  Result Value Ref Range   Sodium 139 135 - 145 mmol/L   Potassium 4.0 3.5 - 5.1 mmol/L   Chloride 99 98 - 111 mmol/L   CO2 31 22 - 32 mmol/L   Glucose, Bld 163 (H) 70 - 99 mg/dL   BUN 27 (H) 8 - 23 mg/dL   Creatinine, Ser 1.16 0.61 - 1.24 mg/dL   Calcium 8.6 (L) 8.9 - 10.3 mg/dL   GFR, Estimated >60 >60 mL/min   Anion gap 9 5 - 15  Vitamin B12     Status: Abnormal    Collection Time: 09/14/21  7:06 AM  Result Value Ref Range   Vitamin B-12 3,634 (H) 180 - 914 pg/mL  Folate     Status: None   Collection Time: 09/14/21  7:06 AM  Result Value Ref Range   Folate 17.6 >5.9 ng/mL  Iron and TIBC     Status: Abnormal   Collection Time: 09/14/21  7:06 AM  Result Value Ref Range   Iron 21 (L) 45 - 182 ug/dL   TIBC 441 250 - 450 ug/dL   Saturation Ratios 5 (L) 17.9 - 39.5 %   UIBC 420 ug/dL  Ferritin     Status: None   Collection Time: 09/14/21  7:06 AM  Result Value Ref Range   Ferritin 30 24 - 336 ng/mL  Glucose, capillary     Status: Abnormal   Collection Time: 09/14/21  7:53 AM  Result Value Ref Range   Glucose-Capillary 168 (H) 70 - 99 mg/dL  Prepare RBC (crossmatch)     Status: None   Collection Time: 09/14/21  9:04 AM  Result Value Ref Range   Order Confirmation      ORDER PROCESSED BY BLOOD BANK Performed at [facility], [address], [place], [place] [address]   Glucose, capillary     Status: Abnormal   Collection Time: 09/14/21 11:37 AM  Result Value Ref Range   Glucose-Capillary 162 (H) 70 - 99 mg/dL   *Note: Due  to a large number of results and/or encounters for the requested time period, some results have not been displayed. A complete set of results can be found in Results Review.    I have Reviewed nursing notes, Vitals, and Lab results since pt's last encounter. Pertinent lab results : see above I have ordered test including BMP, CBC, Mg I have reviewed the last note from staff over past 24 hours I have discussed pt's care plan and test results with nursing staff, case manager   LOS: 0 days   Dwyane Dee, MD [facility] 09/14/2021, 1:54 PM

## 2021-09-14 NOTE — Assessment & Plan Note (Signed)
Holding aspirin for now.  Continue statin.

## 2021-09-14 NOTE — Assessment & Plan Note (Signed)
Continue nebivolol and IV Lasix as above.

## 2021-09-14 NOTE — Assessment & Plan Note (Addendum)
-   Dyspnea likely multifactorial from acute on chronic HFpEF, acute on chronic anemia, and underlying COPD.  Appeared mildly volume overloaded on admission.  BNP 267.8.  Last TTE 08/21/2020 showed EF 60-65%. -Treated with IV Lasix with good response and resumed back on home torsemide -Echo performed this admission: EF 55 to 60%, no RWMA, indeterminate diastolic parameters

## 2021-09-14 NOTE — Assessment & Plan Note (Addendum)
Remains in atrial flutter with 4:1 block but otherwise controlled rate.  Continue nebivolol.  -Eliquis resumed at discharge to be restarted on 09/17/2021

## 2021-09-14 NOTE — Assessment & Plan Note (Addendum)
-   Some wheezing appreciated on exam this morning - Change albuterol to DuoNeb's - Continue Trelegy - Start on steroids and monitor response

## 2021-09-14 NOTE — Hospital Course (Addendum)
John [name] is a 76 y.o. male with PMH afib/flutter on Eliquis, HFpEF (last EF 60-65% 08/21/2020), COPD, PAD (s/p right femoral endarterectomy, right common iliac stenting), stage IIIa non-small cell lung cancer (s/p chemo and radiation therapy), T2DM, HTN, HLD, hypothyroidism, OSA on CPAP who was admitted with SOB.  There was concern for CHF exacerbation and possible contribution from underlying worsening anemia.  He was recently evaluated with outpatient blood work which revealed a low hemoglobin and he was recommended to present to the ER for further work-up. CT abdomen/pelvis was negative for obvious source of GI bleeding.  Small right pleural effusion was appreciated.  BNP was mildly elevated.  He was started on IV Lasix and admitted for further monitoring.

## 2021-09-14 NOTE — Consult Note (Signed)
Referring Provider: Cherlynn June, PA Primary Care Physician:  [name], FNP Primary Gastroenterologist:  Dr. Henrene Pastor  Reason for Consultation:  Anemia and heme positive stool  HPI: John [name] is a 76 y.o. male with medical history significant for paroxysmal A-fib/flutter on Eliquis, HFpEF (last EF 60-65% 08/21/2020), COPD, PAD (s/p right femoral endarterectomy, right common iliac stenting), stage IIIa non-small cell lung cancer (s/p chemo and radiation therapy), T2DM, HTN, HLD, hypothyroidism, OSA on CPAP who presented to the ED for evaluation of dyspnea and anemia.  Patient reports 1-2 weeks of progressive fatigue, exertional dyspnea, feeling cold.  His significant other notes that he has been appearing more pale than usual.  He was seen by his PCP and labs were obtained on 6/8 which resulted with a low hemoglobin of 7.8 grams.  He was called to present to the ED for further evaluation.  He has not seen any red blood per rectum or in his stool, but he says that it is hard to tell because his stools are dark from his iron supplementation.  He was on this BID but says that they decreased to once daily recently.  He has not seen any other obvious bleeding.  He does take Eliquis and aspirin.  Is heme positive here.  Hgb down to 7.0 grams so is going to receive one unit of PRBCs.    Reports that his abdomen feels "tight" and sore for quite some time.  CT scan of the abdomen and pelvis with contrast on 6/9:  IMPRESSION: 1. No source of gastrointestinal bleeding identified. 2. No inflammation or infection identified in the GI tract. 3. New small RIGHT effusion. 4. Stable LEFT lobe pulmonary nodule.  Colonoscopy 06/20/2021 showed a single large nonbleeding angiodysplastic lesion that was not treated and diverticulosis. Last EGD 09/06/2020 showed tiny gastric ulcer without bleeding.  Past Medical History:  Diagnosis Date   Adenocarcinoma of lung, right ([facility]) 04/18/2016   Anxiety     Arthritis    Asthma    BPH (benign prostatic hyperplasia)    with urinary retention 02/06/20   CHF (congestive heart failure) (H[facility])    COPD (chronic obstructive pulmonary disease) (H[facility])    Depression    Diabetes mellitus, type II ([facility])    Dyspnea    History of kidney stones    History of radiation therapy    right lung 09/10/2020-09/17/2020   Dr Sondra Come   Hyperlipidemia    Hypertension    Hypothyroidism    Macular degeneration    Neuropathy    Non-small cell lung cancer, right ([facility]) 04/18/2016   Peripheral vascular disease ([facility])    Prostatitis    Pulmonary nodule, left 07/16/2016   Sleep apnea    cpap    Past Surgical History:  Procedure Laterality Date   ABDOMINAL AORTOGRAM W/LOWER EXTREMITY Left 02/06/2020   Procedure: ABDOMINAL AORTOGRAM W/LOWER EXTREMITY;  Surgeon: Lorretta Harp, MD;  Location: [facility];  Service: Cardiovascular;  Laterality: Left;   ABDOMINAL AORTOGRAM W/LOWER EXTREMITY N/A 05/30/2021   Procedure: ABDOMINAL AORTOGRAM W/LOWER EXTREMITY;  Surgeon: Marty Heck, MD;  Location: [facility];  Service: Cardiovascular;  Laterality: N/A;   CARDIOVERSION N/A 10/05/2020   Procedure: CARDIOVERSION;  Surgeon: Geralynn Rile, MD;  Location: [facility];  Service: Cardiovascular;  Laterality: N/A;   CATARACT EXTRACTION, BILATERAL Bilateral    COLONOSCOPY WITH PROPOFOL N/A 06/20/2021   Procedure: COLONOSCOPY WITH PROPOFOL;  Surgeon: Irene Shipper, MD;  Location: [facility];  Service: Endoscopy;  Laterality: N/A;   ENDARTERECTOMY FEMORAL Right 08/20/2020   Procedure: ENDARTERECTOMY  RIGHT FEMORAL ARTERY;  Surgeon: Marty Heck, MD;  Location: [facility];  Service: Vascular;  Laterality: Right;   ESOPHAGOGASTRODUODENOSCOPY (EGD) WITH PROPOFOL N/A 09/06/2020   Procedure: ESOPHAGOGASTRODUODENOSCOPY (EGD) WITH PROPOFOL;  Surgeon: Irene Shipper, MD;  Location: [facility];  Service: Endoscopy;  Laterality: N/A;   INSERTION OF ILIAC STENT Right  08/20/2020   Procedure: RETROGRADE INSERTION OF RIGHT ILIAC STENT;  Surgeon: Marty Heck, MD;  Location: [facility];  Service: Vascular;  Laterality: Right;   INTRAOPERATIVE ARTERIOGRAM Right 08/20/2020   Procedure: INTRA OPERATIVE ARTERIOGRAM ILIAC;  Surgeon: Marty Heck, MD;  Location: [facility];  Service: Vascular;  Laterality: Right;   PATCH ANGIOPLASTY Right 08/20/2020   Procedure: PATCH ANGIOPLASTY RIGHT FEMORAL ARTERY;  Surgeon: Marty Heck, MD;  Location: [facility];  Service: Vascular;  Laterality: Right;   PERIPHERAL VASCULAR BALLOON ANGIOPLASTY Right 05/30/2021   Procedure: PERIPHERAL VASCULAR BALLOON ANGIOPLASTY;  Surgeon: Marty Heck, MD;  Location: [facility];  Service: Cardiovascular;  Laterality: Right;   PORTACATH PLACEMENT Left 06/13/2016   Procedure: INSERTION PORT-A-CATH;  Surgeon: Aviva Signs, MD;  Location: [facility];  Service: General;  Laterality: Left;   TRANSURETHRAL RESECTION OF PROSTATE N/A 05/31/2020   Procedure: TRANSURETHRAL RESECTION OF THE PROSTATE (TURP);  Surgeon: Cleon Gustin, MD;  Location: [facility];  Service: Urology;  Laterality: N/A;   VIDEO BRONCHOSCOPY WITH ENDOBRONCHIAL NAVIGATION N/A 05/28/2016   Procedure: VIDEO BRONCHOSCOPY WITH ENDOBRONCHIAL NAVIGATION;  Surgeon: [name], MD;  Location: [facility];  Service: Thoracic;  Laterality: N/A;   VIDEO BRONCHOSCOPY WITH ENDOBRONCHIAL ULTRASOUND N/A 05/28/2016   Procedure: VIDEO BRONCHOSCOPY WITH ENDOBRONCHIAL ULTRASOUND;  Surgeon: [name], MD;  Location: [facility];  Service: Thoracic;  Laterality: N/A;    Prior to Admission medications   Medication Sig Start Date End Date Taking? Authorizing Provider  acetaminophen (TYLENOL) 650 MG CR tablet Take 1,300 mg by mouth every 8 (eight) hours as needed for pain.   Yes [provider]  albuterol (PROVENTIL) (2.5 MG/3ML) 0.083% nebulizer solution Take 3 mLs (2.5 mg total) by nebulization every 4 (four) hours as needed  for wheezing or shortness of breath (if not able to take inhaler). 08/27/21 08/27/22 Yes Vann, Douglas Smolinsky U, DO  albuterol (VENTOLIN HFA) 108 (90 Base) MCG/ACT inhaler Inhale 2 puffs into the lungs every 4 (four) hours as needed for wheezing or shortness of breath. 03/05/21  Yes Hawks, Christy A, FNP  apixaban (ELIQUIS) 5 MG TABS tablet TAKE 1 TABLET BY MOUTH TWICE A DAY Patient taking differently: Take 5 mg by mouth 2 (two) times daily. 04/16/21  Yes O'Neal, Cassie Freer, MD  aspirin EC 81 MG tablet Take 81 mg by mouth daily. Swallow whole.   Yes [provider]  Blood Glucose Monitoring Suppl (ONETOUCH VERIO REFLECT) w/Device KIT Use to test blood sugars daily as directed. DX: E11.9 10/14/19  Yes Hawks, Christy A, FNP  Cyanocobalamin (B-12 PO) Take 1 tablet by mouth daily.   Yes [provider]  doxycycline (VIBRA-TABS) 100 MG tablet Take 1 tablet (100 mg total) by mouth 2 (two) times daily. Patient taking differently: Take 100 mg by mouth See admin instructions. Bid x 10 days 09/06/21  Yes Hawks, Christy A, FNP  fluticasone (FLONASE) 50 MCG/ACT nasal spray PLACE 1 SPRAY INTO BOTH NOSTRILS DAILY AS NEEDED FOR ALLERGIES OR RHINITIS. 04/17/21  Yes Hawks, [place] A,  FNP  Fluticasone-Umeclidin-Vilant (TRELEGY ELLIPTA) 100-62.5-25 MCG/INH AEPB Inhale 1 puff into the lungs daily. 11/08/20  Yes Tanda Rockers, MD  gabapentin (NEURONTIN) 300 MG capsule TAKE 1 CAPSULE BY MOUTH THREE TIMES A DAY Patient taking differently: Take 300-600 mg by mouth See admin instructions. Taking 300 mg in the afternoon and 600 mg at bedtime 07/16/21  Yes Hawks, Christy A, FNP  Iron, Ferrous Sulfate, 325 (65 Fe) MG TABS Take 325 mg by mouth 2 (two) times daily. Patient taking differently: Take 325 mg by mouth every evening. 04/23/21  Yes Hawks, Christy A, FNP  KLOR-CON M20 20 MEQ tablet TAKE 1 TABLET BY MOUTH EVERY DAY Patient taking differently: Take 20 mEq by mouth daily. 07/16/21  Yes Hawks, Christy A, FNP   levothyroxine (SYNTHROID) 50 MCG tablet TAKE 1 TABLET BY MOUTH EVERY DAY Patient taking differently: Take 50 mcg by mouth daily before breakfast. 07/16/21  Yes Hawks, [name], FNP  linaclotide (LINZESS) 72 MCG capsule Take 1 capsule (72 mcg total) by mouth daily before breakfast. Patient taking differently: Take 72 mcg by mouth daily as needed (constipation). 09/10/21  Yes Hawks, Christy A, FNP  Melatonin 10 MG TABS Take 10 mg by mouth at bedtime as needed (sleep).   Yes [provider]  nebivolol (BYSTOLIC) 2.5 MG tablet TAKE 1 TABLET BY MOUTH EVERY DAY Patient taking differently: Take 2.5 mg by mouth daily. 07/16/21  Yes O'Neal, Cassie Freer, MD  NON FORMULARY CPAP at bedtime   Yes [provider]  ondansetron (ZOFRAN) 4 MG tablet Take 1 tablet (4 mg total) by mouth every 8 (eight) hours as needed for nausea or vomiting. 09/03/21  Yes Hawks, [name], FNP  OneTouch Delica Lancets 29H MISC Use to test blood sugars daily as directed. DX: E11.9 10/14/19  Yes Hawks, Christy A, FNP  ONETOUCH VERIO test strip USE TO TEST BLOOD SUGARS DAILY AS DIRECTED. DX: E11.9 12/28/20  Yes Hawks, Christy A, FNP  OXYGEN Inhale 2 L into the lungs continuous.   Yes [provider]  pantoprazole (PROTONIX) 40 MG tablet Take 1 tablet (40 mg total) by mouth daily. 04/25/21 10/22/21 Yes Derek Jack, MD  pravastatin (PRAVACHOL) 40 MG tablet TAKE 1 TABLET (40 MG TOTAL) BY MOUTH ON MONDAYS , WEDNESDAYS, AND FRIDAYS AS DIRECTED Patient taking differently: Take 40 mg by mouth every Monday, Wednesday, and Friday. 5 pm 07/16/21  Yes Hawks, Christy A, FNP  Semaglutide (RYBELSUS) 7 MG TABS Take 7 mg by mouth daily. 05/24/21  Yes Hawks, Christy A, FNP  torsemide (DEMADEX) 20 MG tablet Take 2 tablets (40 mg) in the morning, take 2 tablets (40 mg) in the evening daily as needed. Patient taking differently: Take 20 mg by mouth See admin instructions. Take 2 tablets (40 mg) in the morning, take 2 tablets (40  mg) in the evening daily as needed for swelling 09/06/21  Yes O'Neal, Cassie Freer, MD  traZODone (DESYREL) 50 MG tablet Take 0.5 tablets (25 mg total) by mouth at bedtime. 09/06/21  Yes O'Neal, Cassie Freer, MD  nystatin (MYCOSTATIN) 100000 UNIT/ML suspension Take 5 mLs (500,000 Units total) by mouth 4 (four) times daily. Patient not taking: Reported on 09/13/2021 09/05/21   [name], FNP    Current Facility-Administered Medications  Medication Dose Route Frequency Provider Last Rate Last Admin   0.9 %  sodium chloride infusion (Manually program via Guardrails IV Fluids)   Intravenous Once Dwyane Dee, MD       0.9 %  sodium chloride infusion   Intravenous Continuous Mansouraty, Telford Nab., MD       acetaminophen (TYLENOL) tablet 650 mg  650 mg Oral Q6H PRN Lenore Cordia, MD       Or   acetaminophen (TYLENOL) suppository 650 mg  650 mg Rectal Q6H PRN Lenore Cordia, MD       Chlorhexidine Gluconate Cloth 2 % PADS 6 each  6 each Topical Daily Dwyane Dee, MD   6 each at 09/14/21 1119   fluticasone furoate-vilanterol (BREO ELLIPTA) 100-25 MCG/ACT 1 puff  1 puff Inhalation Daily Lenore Cordia, MD   1 puff at 09/14/21 0907   And   umeclidinium bromide (INCRUSE ELLIPTA) 62.5 MCG/ACT 1 puff  1 puff Inhalation Daily Lenore Cordia, MD   1 puff at 09/14/21 0907   furosemide (LASIX) injection 40 mg  40 mg Intravenous Q12H Lenore Cordia, MD   40 mg at 09/14/21 0535   gabapentin (NEURONTIN) capsule 300 mg  300 mg Oral Daily Lenore Cordia, MD       gabapentin (NEURONTIN) capsule 600 mg  600 mg Oral QHS Zada Finders R, MD   600 mg at 09/14/21 0133   insulin aspart (novoLOG) injection 0-9 Units  0-9 Units Subcutaneous TID WC Lenore Cordia, MD   2 Units at 09/14/21 1241   ipratropium-albuterol (DUONEB) 0.5-2.5 (3) MG/3ML nebulizer solution 3 mL  3 mL Nebulization Q6H PRN Dwyane Dee, MD       iron dextran complex (INFED) 25 mg in sodium chloride 0.9 % 50 mL IVPB  25 mg Intravenous  Once Dwyane Dee, MD       Followed by   iron dextran complex (INFED) 1,000 mg in sodium chloride 0.9 % 500 mL IVPB  1,000 mg Intravenous Once Dwyane Dee, MD       levothyroxine (SYNTHROID) tablet 50 mcg  50 mcg Oral Q0600 Lenore Cordia, MD   50 mcg at 09/14/21 0533   methylPREDNISolone sodium succinate (SOLU-MEDROL) 40 mg/mL injection 40 mg  40 mg Intravenous BID Dwyane Dee, MD       nebivolol (BYSTOLIC) tablet 2.5 mg  2.5 mg Oral Daily Zada Finders R, MD   2.5 mg at 09/14/21 1240   ondansetron (ZOFRAN) tablet 4 mg  4 mg Oral Q6H PRN Lenore Cordia, MD       Or   ondansetron (ZOFRAN) injection 4 mg  4 mg Intravenous Q6H PRN Lenore Cordia, MD       pantoprazole (PROTONIX) injection 40 mg  40 mg Intravenous Q12H Zada Finders R, MD   40 mg at 09/14/21 0120   [START ON 09/16/2021] pravastatin (PRAVACHOL) tablet 40 mg  40 mg Oral Q M,W,F Patel, Cleaster Corin, MD       senna-docusate (Senokot-S) tablet 1 tablet  1 tablet Oral QHS PRN Lenore Cordia, MD       sodium chloride flush (NS) 0.9 % injection 10-40 mL  10-40 mL Intracatheter PRN Dwyane Dee, MD       sodium chloride flush (NS) 0.9 % injection 3 mL  3 mL Intravenous Q12H Zada Finders R, MD   3 mL at 09/14/21 0800    Allergies as of 09/13/2021 - Review Complete 09/13/2021  Allergen Reaction Noted   Sulfa antibiotics Swelling 06/18/2020   Atorvastatin Other (See Comments) 08/21/2020   Jardiance [empagliflozin] Other (See Comments) 09/23/2019   Lopressor [metoprolol] Other (See Comments) 09/21/2019   Rosuvastatin Other (See Comments) 08/21/2020  Doxycycline Nausea Only 08/23/2021   Temazepam Other (See Comments) 10/04/2020    Family History  Problem Relation Age of Onset   Hypertension Mother    Diabetes Father    Heart disease Father    Stroke Father    Hypertension Sister     Social History   Socioeconomic History   Marital status: Soil scientist    Spouse name: Not on file   Number of children: 5    Years of education: 10   Highest education level: 10th grade  Occupational History   Occupation: Retired  Tobacco Use   Smoking status: Every Day    Packs/day: 0.50    Years: 55.00    Total pack years: 27.50    Types: Cigarettes    Start date: 03/11/1961   Smokeless tobacco: Never   Tobacco comments:    1/2 pack to 1 pack per day 12/14/19  Vaping Use   Vaping Use: Never used  Substance and Sexual Activity   Alcohol use: No   Drug use: No   Sexual activity: Yes    Birth control/protection: None  Other Topics Concern   Not on file  Social History Narrative   [name] is his POA and lives with him. Children out of town - one in [facility], others in [place].   Social Determinants of Health   Financial Resource Strain: Low Risk  (09/18/2020)   Overall Financial Resource Strain (CARDIA)    Difficulty of Paying Living Expenses: Not hard at all  Food Insecurity: No Food Insecurity (09/18/2020)   Hunger Vital Sign    Worried About Running Out of Food in the Last Year: Never true    Ran Out of Food in the Last Year: Never true  Transportation Needs: No Transportation Needs (09/18/2020)   PRAPARE - Hydrologist (Medical): No    Lack of Transportation (Non-Medical): No  Physical Activity: Unknown (09/18/2020)   Exercise Vital Sign    Days of Exercise per Week: 7 days    Minutes of Exercise per Session: Not on file  Stress: No Stress Concern Present (09/18/2020)   [place]    Feeling of Stress : Not at all  Social Connections: Moderately Isolated (09/18/2020)   Social Connection and Isolation Panel [NHANES]    Frequency of Communication with Friends and Family: More than three times a week    Frequency of Social Gatherings with Friends and Family: Once a week    Attends Religious Services: Never    Marine scientist or Organizations: No    Attends Archivist Meetings: Never     Marital Status: Living with partner  Intimate Partner Violence: Not At Risk (09/18/2020)   Humiliation, Afraid, Rape, and Kick questionnaire    Fear of Current or Ex-Partner: No    Emotionally Abused: No    Physically Abused: No    Sexually Abused: No   Review of Systems: ROS is O/W negative except as mentioned in HPI.  Physical Exam: Vital signs in last 24 hours: Temp:  [97.8 F (36.6 C)-98.5 F (36.9 C)] 97.8 F (36.6 C) (06/10 1138) Pulse Rate:  [50-133] 83 (06/10 1138) Resp:  [14-29] 17 (06/10 1138) BP: (108-162)/(34-126) 126/56 (06/10 1138) SpO2:  [87 %-100 %] 100 % (06/10 1138) FiO2 (%):  [28 %] 28 % (06/10 0909) Weight:  [74.8 kg] 74.8 kg (06/10 0504)   General:  Alert, Well-developed, well-nourished, pleasant and  cooperative in NAD' pale Head:  Normocephalic and atraumatic. Eyes:  Sclera clear, no icterus.  Conjunctiva pale. Ears:  Normal auditory acuity. Mouth:  No deformity or lesions.   Lungs:  Some wheezing noted B/L. Heart:  Regular rate and rhythm; no murmurs, clicks, rubs, or gallops. Abdomen:  Soft, seems somewhat distended.  BS present.  Mild diffuse TTP. Rectal:  Deferred  Msk:  Symmetrical without gross deformities. Pulses:  Normal pulses noted. Extremities:  Without clubbing or edema. Neurologic:  Alert and oriented x 4;  grossly normal neurologically. Skin:  Intact without significant lesions or rashes. Psych:  Alert and cooperative. Normal mood and affect.  Intake/Output this shift: Total I/O In: -  Out: 1000 [Urine:1000]  Lab Results: Recent Labs    09/12/21 1548 09/13/21 1525 09/14/21 0706  WBC 9.9 9.6 8.2  HGB 7.8* 8.6* 7.0*  HCT 25.3* 30.9* 23.9*  PLT 281 266 249   BMET Recent Labs    09/13/21 1525 09/14/21 0706  NA 138 139  K 4.3 4.0  CL 97* 99  CO2 27 31  GLUCOSE 147* 163*  BUN 32* 27*  CREATININE 1.14 1.16  CALCIUM 9.2 8.6*    Studies/Results: CT Abdomen Pelvis W Contrast  Result Date: 09/13/2021 CLINICAL DATA:   Abdominal pain. Acute nonlocalized. Bleeding ulcer. Hypotension. Low hemoglobin. Non-small cell lung cancer. * Tracking Code: BO * EXAM: CT ABDOMEN AND PELVIS WITH CONTRAST TECHNIQUE: Multidetector CT imaging of the abdomen and pelvis was performed using the standard protocol following bolus administration of intravenous contrast. RADIATION DOSE REDUCTION: This exam was performed according to the departmental dose-optimization program which includes automated exposure control, adjustment of the mA and/or kV according to patient size and/or use of iterative reconstruction technique. CONTRAST:  125m OMNIPAQUE IOHEXOL 300 MG/ML  SOLN COMPARISON:  Chest CT 06/19/2021, PET-CT 08/13/2020 FINDINGS: Lower chest: 5 mm nodule in the LEFT lower lobe (image 1/4 present on comparison PET-CT scan from 08/26/2020. No change in size. New RIGHT pleural effusion compared to most recent CT chest Hepatobiliary: No focal hepatic lesion. No biliary duct dilatation. Common bile duct is normal. Pancreas: Pancreas is normal. No ductal dilatation. No pancreatic inflammation. Spleen: Normal spleen Adrenals/urinary tract: Adrenal glands normal. Kidneys ureters and bladder are unchanged. 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 No change from multiple comparison exams Stomach/Bowel: Stomach is normal. Duodenum small-bowel normal. Appendix normal. Ascending, transverse and descending colon normal. No evidence of gastrointestinal bleeding, inflammation, or infection the GI tract. Vascular/Lymphatic: Abdominal aorta is normal caliber with atherosclerotic calcification. There is no retroperitoneal or periportal lymphadenopathy. No pelvic lymphadenopathy. Reproductive: TURP defect in the prostate gland Other: No intraperitoneal free fluid. Musculoskeletal: No aggressive osseous lesion. IMPRESSION: 1. No source of gastrointestinal bleeding identified. 2. No inflammation or infection identified in the GI  tract. 3. New small RIGHT effusion. 4. Stable LEFT lobe pulmonary nodule. Electronically Signed   By: SSuzy BouchardM.D.   On: 09/13/2021 21:43   DG Chest 2 View  Result Date: 09/13/2021 CLINICAL DATA:  Weakness EXAM: CHEST - 2 VIEW COMPARISON:  None Available. FINDINGS: Pulmonary insufflation is normal and symmetric. There is parenchymal scarring within the lung apices noted. Right perihilar pulmonary opacity appears more prominent and may reflect post radiation changes noted on CT examination of 06/19/2021, but is not optimally assessed on this examination. No pneumothorax or pleural effusion. Cardiac size within normal limits. Prominent mitral valve annular calcification noted. Pulmonary vascularity is normal. No  acute bone abnormality. IMPRESSION: Progressive right posterior perihilar pulmonary opacity, possibly representing post radiation change. This could be better assessed with dedicated nonemergent contrast enhanced CT imaging No radiographic evidence of acute cardiopulmonary disease. Electronically Signed   By: Fidela Salisbury M.D.   On: 09/13/2021 19:38    IMPRESSION:  *Symptomatic anemia and heme positive stool: Baseline hemoglobin 10 to 11 g.  Recently 7.8 g with PCP as outpatient.  Is 7.0 grams here this AM so is getting one unit of PRBCs.  Is FOBT positive.  Colonoscopy 06/20/2021 showed a single large nonbleeding angiodysplastic lesion. Last EGD 09/06/2020 showed tiny gastric ulcer without bleeding.  Is on PO iron at home. *Atrial fibrillation on Eliquis:  Last dose 6/9. *COPD and history of non-small cell lung cancer:  Uses intermittent home O2.  PLAN: -Likely would benefit from some IV iron while he is here. -Monitor Hgb and transfuse further prn. -Will plan for EGD and colonoscopy on Monday, 6/12.  I am going to let him eat tonight and then clear liquids on Sunday.  Laban Emperor. Bari Handshoe  09/14/2021, 1:55 PM

## 2021-09-14 NOTE — Assessment & Plan Note (Signed)
S/p chemotherapy and radiation.  Follows with Dr. Delton Coombes.  CXR shows progressive right posterior perihilar pulmonary opacity, possibly representing postradiation change.  Considered nonemergent contrast-enhanced CT for better assessment. -Appears that patient has outpatient CT chest scheduled for 12/24/2021 already

## 2021-09-14 NOTE — Assessment & Plan Note (Signed)
Continue pravastatin 

## 2021-09-14 NOTE — Assessment & Plan Note (Signed)
Continue CPAP nightly. °

## 2021-09-14 NOTE — Progress Notes (Signed)
Pt wants to hold off on wearing CPAP tonight.

## 2021-09-14 NOTE — Assessment & Plan Note (Signed)
Well-controlled with last A1c 5.4% 08/25/2021.  Hold home Rybelsus and placed on sensitive SSI.

## 2021-09-15 ENCOUNTER — Inpatient Hospital Stay (HOSPITAL_COMMUNITY): Payer: Medicare Other

## 2021-09-15 DIAGNOSIS — D649 Anemia, unspecified: Secondary | ICD-10-CM | POA: Diagnosis not present

## 2021-09-15 DIAGNOSIS — D509 Iron deficiency anemia, unspecified: Secondary | ICD-10-CM

## 2021-09-15 DIAGNOSIS — I5033 Acute on chronic diastolic (congestive) heart failure: Secondary | ICD-10-CM | POA: Diagnosis not present

## 2021-09-15 LAB — BASIC METABOLIC PANEL
Anion gap: 10 (ref 5–15)
BUN: 24 mg/dL — ABNORMAL HIGH (ref 8–23)
CO2: 29 mmol/L (ref 22–32)
Calcium: 8.7 mg/dL — ABNORMAL LOW (ref 8.9–10.3)
Chloride: 98 mmol/L (ref 98–111)
Creatinine, Ser: 1.11 mg/dL (ref 0.61–1.24)
GFR, Estimated: >60 mL/min (ref >60)
Glucose, Bld: 231 mg/dL — ABNORMAL HIGH (ref 70–99)
Potassium: 3.9 mmol/L (ref 3.5–5.1)
Sodium: 137 mmol/L (ref 135–145)

## 2021-09-15 LAB — ECHOCARDIOGRAM COMPLETE
AR max vel: 1.87 cm2
AV Peak grad: 11.5 mmHg
Ao pk vel: 1.7 m/s
Area-P 1/2: 5.97 cm2
MV VTI: 2.2 cm2
S' Lateral: 3.4 cm
Weight: 2636.8 oz

## 2021-09-15 LAB — CBC
HCT: 27.8 % — ABNORMAL LOW (ref 39.0–52.0)
Hemoglobin: 8.4 g/dL — ABNORMAL LOW (ref 13.0–17.0)
MCH: 33.3 pg (ref 26.0–34.0)
MCHC: 30.2 g/dL (ref 30.0–36.0)
MCV: 110.3 fL — ABNORMAL HIGH (ref 80.0–100.0)
Platelets: 235 10*3/uL (ref 150–400)
RBC: 2.52 MIL/uL — ABNORMAL LOW (ref 4.22–5.81)
RDW: 19 % — ABNORMAL HIGH (ref 11.5–15.5)
WBC: 5.8 10*3/uL (ref 4.0–10.5)
nRBC: 0 % (ref 0.0–0.2)

## 2021-09-15 LAB — TYPE AND SCREEN
ABO/RH(D): O POS
Antibody Screen: NEGATIVE
Unit division: 0

## 2021-09-15 LAB — GLUCOSE, CAPILLARY
Glucose-Capillary: 238 mg/dL — ABNORMAL HIGH (ref 70–99)
Glucose-Capillary: 307 mg/dL — ABNORMAL HIGH (ref 70–99)
Glucose-Capillary: 278 mg/dL — ABNORMAL HIGH (ref 70–99)
Glucose-Capillary: 158 mg/dL — ABNORMAL HIGH (ref 70–99)

## 2021-09-15 LAB — BPAM RBC
Blood Product Expiration Date: 202307082359
ISSUE DATE / TIME: 202306101615
Unit Type and Rh: 5100

## 2021-09-15 LAB — TROPONIN I (HIGH SENSITIVITY)
Troponin I (High Sensitivity): 16 ng/L (ref <18)
Troponin I (High Sensitivity): 16 ng/L (ref <18)

## 2021-09-15 MED ORDER — PEG-KCL-NACL-NASULF-NA ASC-C 100 G PO SOLR: 1.0000 | Freq: Once | ORAL | Status: DC

## 2021-09-15 MED ORDER — PEG-KCL-NACL-NASULF-NA ASC-C 100 G PO SOLR
0.5000 | Freq: Once | ORAL | Status: AC
  Administered 2021-09-16: 100 g via ORAL
  Filled 2021-09-15: qty 1

## 2021-09-15 MED ORDER — MELATONIN 5 MG PO TABS
5.0000 mg | ORAL_TABLET | Freq: Every evening | ORAL | Status: DC | PRN
  Administered 2021-09-15: 5 mg via ORAL
  Filled 2021-09-15: qty 1

## 2021-09-15 MED ORDER — PERFLUTREN LIPID MICROSPHERE
1.0000 mL | INTRAVENOUS | Status: AC | PRN
  Administered 2021-09-15: 2 mL via INTRAVENOUS

## 2021-09-15 MED ORDER — PEG-KCL-NACL-NASULF-NA ASC-C 100 G PO SOLR
0.5000 | Freq: Once | ORAL | Status: AC
  Administered 2021-09-15: 100 g via ORAL
  Filled 2021-09-15: qty 1

## 2021-09-15 NOTE — H&P (View-Only) (Signed)
[facility] Progress Note  CC:  Anemia and heme positive stool  Subjective:  Feels good.  Had an 11 beat run of VT overnight while receiving Infed.  It was almost completed so they do not think it was related to that.  He was asymptomatic at the time.    Objective:  Vital signs in last 24 hours: Temp:  [97.4 F (36.3 C)-98.5 F (36.9 C)] 97.5 F (36.4 C) (06/11 0427) Pulse Rate:  [78-83] 78 (06/11 0427) Resp:  [16-18] 18 (06/11 0427) BP: (113-132)/(45-60) 117/45 (06/11 0427) SpO2:  [91 %-100 %] 98 % (06/11 0427)   General:  Alert, Well-developed, in NAD Heart:  Regular rate and rhythm; no murmurs Pulm:  CTAB.  No W/R/R. Abdomen:  Soft, non-distended.  BS present.  Non-tender.  Extremities:  Without edema. Neurologic:  Alert and oriented x 4;  grossly normal neurologically. Psych:  Alert and cooperative. Normal mood and affect.  Intake/Output from previous day: 06/10 0701 - 06/11 0700 In: 1287.1 [P.O.:960; IV Piggyback:327.1] Out: 2900 [Urine:2900]  Lab Results: Recent Labs    09/13/21 1525 09/14/21 0706 09/14/21 2106 09/15/21 0252  WBC 9.6 8.2  --  5.8  HGB 8.6* 7.0* 8.2* 8.4*  HCT 30.9* 23.9* 28.7* 27.8*  PLT 266 249  --  235   BMET Recent Labs    09/13/21 1525 09/14/21 0706 09/15/21 0252  NA 138 139 137  K 4.3 4.0 3.9  CL 97* 99 98  CO2 27 31 29   GLUCOSE 147* 163* 231*  BUN 32* 27* 24*  CREATININE 1.14 1.16 1.11  CALCIUM 9.2 8.6* 8.7*   CT Abdomen Pelvis W Contrast  Result Date: 09/13/2021 CLINICAL DATA:  Abdominal pain. Acute nonlocalized. Bleeding ulcer. Hypotension. Low hemoglobin. Non-small cell lung cancer. * Tracking Code: BO * EXAM: CT ABDOMEN AND PELVIS WITH CONTRAST TECHNIQUE: Multidetector CT imaging of the abdomen and pelvis was performed using the standard protocol following bolus administration of intravenous contrast. RADIATION DOSE REDUCTION: This exam was performed according to the departmental dose-optimization program  which includes automated exposure control, adjustment of the mA and/or kV according to patient size and/or use of iterative reconstruction technique. CONTRAST:  162mL OMNIPAQUE IOHEXOL 300 MG/ML  SOLN COMPARISON:  Chest CT 06/19/2021, PET-CT 08/13/2020 FINDINGS: Lower chest: 5 mm nodule in the LEFT lower lobe (image 1/4 present on comparison PET-CT scan from 08/26/2020. No change in size. New RIGHT pleural effusion compared to most recent CT chest Hepatobiliary: No focal hepatic lesion. No biliary duct dilatation. Common bile duct is normal. Pancreas: Pancreas is normal. No ductal dilatation. No pancreatic inflammation. Spleen: Normal spleen Adrenals/urinary tract: Adrenal glands normal. Kidneys ureters and bladder are unchanged. 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 No change from multiple comparison exams Stomach/Bowel: Stomach is normal. Duodenum small-bowel normal. Appendix normal. Ascending, transverse and descending colon normal. No evidence of gastrointestinal bleeding, inflammation, or infection the GI tract. Vascular/Lymphatic: Abdominal aorta is normal caliber with atherosclerotic calcification. There is no retroperitoneal or periportal lymphadenopathy. No pelvic lymphadenopathy. Reproductive: TURP defect in the prostate gland Other: No intraperitoneal free fluid. Musculoskeletal: No aggressive osseous lesion. IMPRESSION: 1. No source of gastrointestinal bleeding identified. 2. No inflammation or infection identified in the GI tract. 3. New small RIGHT effusion. 4. Stable LEFT lobe pulmonary nodule. Electronically Signed   By: Suzy Bouchard M.D.   On: 09/13/2021 21:43   DG Chest 2 View  Result Date: 09/13/2021 CLINICAL DATA:  Weakness EXAM: CHEST - 2 VIEW COMPARISON:  None Available. FINDINGS: Pulmonary insufflation is normal and symmetric. There is parenchymal scarring within the lung apices noted. Right perihilar pulmonary opacity appears more prominent  and may reflect post radiation changes noted on CT examination of 06/19/2021, but is not optimally assessed on this examination. No pneumothorax or pleural effusion. Cardiac size within normal limits. Prominent mitral valve annular calcification noted. Pulmonary vascularity is normal. No acute bone abnormality. IMPRESSION: Progressive right posterior perihilar pulmonary opacity, possibly representing post radiation change. This could be better assessed with dedicated nonemergent contrast enhanced CT imaging No radiographic evidence of acute cardiopulmonary disease. Electronically Signed   By: Fidela Salisbury M.D.   On: 09/13/2021 19:38    Assessment / Plan: *Symptomatic anemia and heme positive stool: Baseline hemoglobin 10 to 11 g.  Recently 7.8 g with PCP as outpatient.  Is 7.0 grams here this AM so is getting one unit of PRBCs.  Is FOBT positive.  Colonoscopy 06/20/2021 showed a single large nonbleeding angiodysplastic lesion. Last EGD 09/06/2020 showed tiny gastric ulcer without bleeding.  Is on PO iron at home.  Received a dose of IV iron, Infed.  Hemoglobin is stable at 8.4 g this morning. *Atrial fibrillation on Eliquis:  Last dose 6/9. *COPD and history of non-small cell lung cancer:  Uses intermittent home O2.  -Tentative plans for EGD and colonoscopy on 6/12. -Monitor Hgb and transfuse further prn.    LOS: 1 day   John [name]. John [name]  09/15/2021, 9:35 AM

## 2021-09-15 NOTE — Progress Notes (Signed)
[facility] Progress Note  CC:  Anemia and heme positive stool  Subjective:  Feels good.  Had an 11 beat run of VT overnight while receiving Infed.  It was almost completed so they do not think it was related to that.  He was asymptomatic at the time.    Objective:  Vital signs in last 24 hours: Temp:  [97.4 F (36.3 C)-98.5 F (36.9 C)] 97.5 F (36.4 C) (06/11 0427) Pulse Rate:  [78-83] 78 (06/11 0427) Resp:  [16-18] 18 (06/11 0427) BP: (113-132)/(45-60) 117/45 (06/11 0427) SpO2:  [91 %-100 %] 98 % (06/11 0427)   General:  Alert, Well-developed, in NAD Heart:  Regular rate and rhythm; no murmurs Pulm:  CTAB.  No W/R/R. Abdomen:  Soft, non-distended.  BS present.  Non-tender.  Extremities:  Without edema. Neurologic:  Alert and oriented x 4;  grossly normal neurologically. Psych:  Alert and cooperative. Normal mood and affect.  Intake/Output from previous day: 06/10 0701 - 06/11 0700 In: 1287.1 [P.O.:960; IV Piggyback:327.1] Out: 2900 [Urine:2900]  Lab Results: Recent Labs    09/13/21 1525 09/14/21 0706 09/14/21 2106 09/15/21 0252  WBC 9.6 8.2  --  5.8  HGB 8.6* 7.0* 8.2* 8.4*  HCT 30.9* 23.9* 28.7* 27.8*  PLT 266 249  --  235   BMET Recent Labs    09/13/21 1525 09/14/21 0706 09/15/21 0252  NA 138 139 137  K 4.3 4.0 3.9  CL 97* 99 98  CO2 27 31 29   GLUCOSE 147* 163* 231*  BUN 32* 27* 24*  CREATININE 1.14 1.16 1.11  CALCIUM 9.2 8.6* 8.7*   CT Abdomen Pelvis W Contrast  Result Date: 09/13/2021 CLINICAL DATA:  Abdominal pain. Acute nonlocalized. Bleeding ulcer. Hypotension. Low hemoglobin. Non-small cell lung cancer. * Tracking Code: BO * EXAM: CT ABDOMEN AND PELVIS WITH CONTRAST TECHNIQUE: Multidetector CT imaging of the abdomen and pelvis was performed using the standard protocol following bolus administration of intravenous contrast. RADIATION DOSE REDUCTION: This exam was performed according to the departmental dose-optimization program  which includes automated exposure control, adjustment of the mA and/or kV according to patient size and/or use of iterative reconstruction technique. CONTRAST:  174mL OMNIPAQUE IOHEXOL 300 MG/ML  SOLN COMPARISON:  Chest CT 06/19/2021, PET-CT 08/13/2020 FINDINGS: Lower chest: 5 mm nodule in the LEFT lower lobe (image 1/4 present on comparison PET-CT scan from 08/26/2020. No change in size. New RIGHT pleural effusion compared to most recent CT chest Hepatobiliary: No focal hepatic lesion. No biliary duct dilatation. Common bile duct is normal. Pancreas: Pancreas is normal. No ductal dilatation. No pancreatic inflammation. Spleen: Normal spleen Adrenals/urinary tract: Adrenal glands normal. Kidneys ureters and bladder are unchanged. 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 No change from multiple comparison exams Stomach/Bowel: Stomach is normal. Duodenum small-bowel normal. Appendix normal. Ascending, transverse and descending colon normal. No evidence of gastrointestinal bleeding, inflammation, or infection the GI tract. Vascular/Lymphatic: Abdominal aorta is normal caliber with atherosclerotic calcification. There is no retroperitoneal or periportal lymphadenopathy. No pelvic lymphadenopathy. Reproductive: TURP defect in the prostate gland Other: No intraperitoneal free fluid. Musculoskeletal: No aggressive osseous lesion. IMPRESSION: 1. No source of gastrointestinal bleeding identified. 2. No inflammation or infection identified in the GI tract. 3. New small RIGHT effusion. 4. Stable LEFT lobe pulmonary nodule. Electronically Signed   By: Suzy Bouchard M.D.   On: 09/13/2021 21:43   DG Chest 2 View  Result Date: 09/13/2021 CLINICAL DATA:  Weakness EXAM: CHEST - 2 VIEW COMPARISON:  None Available. FINDINGS: Pulmonary insufflation is normal and symmetric. There is parenchymal scarring within the lung apices noted. Right perihilar pulmonary opacity appears more prominent  and may reflect post radiation changes noted on CT examination of 06/19/2021, but is not optimally assessed on this examination. No pneumothorax or pleural effusion. Cardiac size within normal limits. Prominent mitral valve annular calcification noted. Pulmonary vascularity is normal. No acute bone abnormality. IMPRESSION: Progressive right posterior perihilar pulmonary opacity, possibly representing post radiation change. This could be better assessed with dedicated nonemergent contrast enhanced CT imaging No radiographic evidence of acute cardiopulmonary disease. Electronically Signed   By: Fidela Salisbury M.D.   On: 09/13/2021 19:38    Assessment / Plan: *Symptomatic anemia and heme positive stool: Baseline hemoglobin 10 to 11 g.  Recently 7.8 g with PCP as outpatient.  Is 7.0 grams here this AM so is getting one unit of PRBCs.  Is FOBT positive.  Colonoscopy 06/20/2021 showed a single large nonbleeding angiodysplastic lesion. Last EGD 09/06/2020 showed tiny gastric ulcer without bleeding.  Is on PO iron at home.  Received a dose of IV iron, Infed.  Hemoglobin is stable at 8.4 g this morning. *Atrial fibrillation on Eliquis:  Last dose 6/9. *COPD and history of non-small cell lung cancer:  Uses intermittent home O2.  -Tentative plans for EGD and colonoscopy on 6/12. -Monitor Hgb and transfuse further prn.    LOS: 1 day   John [name]. John [name]  09/15/2021, 9:35 AM

## 2021-09-15 NOTE — Progress Notes (Signed)
Progress Note    John [name]   WCB:[id number]  DOB: [birth year]  DOA: 09/13/2021     1 PCP: [name], FNP  Initial CC: SOB  Hospital Course: John [name] is a 76 y.o. male with PMH afib/flutter on Eliquis, HFpEF (last EF 60-65% 08/21/2020), COPD, PAD (s/p right femoral endarterectomy, right common iliac stenting), stage IIIa non-small cell lung cancer (s/p chemo and radiation therapy), T2DM, HTN, HLD, hypothyroidism, OSA on CPAP who was admitted with SOB.  There was concern for CHF exacerbation and possible contribution from underlying worsening anemia.  He was recently evaluated with outpatient blood work which revealed a low hemoglobin and he was recommended to present to the ER for further work-up. CT abdomen/pelvis was negative for obvious source of GI bleeding.  Small right pleural effusion was appreciated.  BNP was mildly elevated.  He was started on IV Lasix and admitted for further monitoring.  Interval History:  1 episode NSVT overnight. Was at end of infed infusion and doubtful that this is related. Breathing feels better in general. No concerns this morning.  Wife present bedside.  Assessment and Plan: * Acute on chronic heart failure with preserved ejection fraction (HFpEF) (HCC) - Dyspnea likely multifactorial from acute on chronic HFpEF, acute on chronic anemia, and underlying COPD.  Appeared mildly volume overloaded on admission.  BNP 267.8.  Last TTE 08/21/2020 showed EF 60-65%. - continue IV lasix; monitor output -Echo performed this admission: EF 55 to 60%, no RWMA, indeterminate diastolic parameters  Acute on chronic anemia Baseline hemoglobin 10-11 g/dL -Patient endorsed dark stools but states he is also on oral iron at home - Eliquis and aspirin are on hold for now -Hemoglobin improved after 1 unit PRBC on 09/14/2021 - Also received INFeD on 09/14/2021 for low iron stores - GI consulted as well, follow-up recommendations - continue PPI  Paroxysmal  atrial flutter ([place]) Remains in atrial flutter with 4:1 block but otherwise controlled rate.  Continue nebivolol.  Holding Eliquis for now.  COPD (chronic obstructive pulmonary disease) (HCC) - Some wheezing appreciated on exam this morning - Change albuterol to DuoNeb's - Continue Trelegy - Start on steroids and monitor response  Type 2 diabetes mellitus with hyperglycemia, without long-term current use of insulin ([place]) Well-controlled with last A1c 5.4% 08/25/2021.  Hold home Rybelsus and placed on sensitive SSI.  Mixed diabetic hyperlipidemia associated with type 2 diabetes mellitus (HCC) Continue pravastatin.  Hypertension associated with diabetes ([place]) Continue nebivolol and IV Lasix as above.  Hypothyroidism Continue Synthroid.  Non-small cell lung cancer, right [facility]) S/p chemotherapy and radiation.  Follows with Dr. Delton Coombes.  CXR shows progressive right posterior perihilar pulmonary opacity, possibly representing postradiation change.  Considered nonemergent contrast-enhanced CT for better assessment.  PAD (peripheral artery disease) (HCC) Holding aspirin for now.  Continue statin.  Obstructive sleep apnea treated with continuous positive airway pressure (CPAP) Continue CPAP nightly.   Old records reviewed in assessment of this patient  Antimicrobials:   DVT prophylaxis:  SCDs Start: 09/14/21 0001   Code Status:   Code Status: Full Code  Disposition Plan:  Home in 1-2 days Status is: Inpt  Objective: Blood pressure (!) 117/57, pulse 80, temperature 98 F (36.7 C), temperature source Oral, resp. rate 19, weight 74.8 kg, SpO2 98 %.  Examination:  Physical Exam Constitutional:      General: He is not in acute distress.    Appearance: Normal appearance.  HENT:     Head: Normocephalic and atraumatic.  Mouth/Throat:     Mouth: Mucous membranes are moist.  Eyes:     Extraocular Movements: Extraocular movements intact.  Cardiovascular:     Rate and  Rhythm: Normal rate and regular rhythm.     Heart sounds: Normal heart sounds.  Pulmonary:     Effort: Pulmonary effort is normal. No respiratory distress.     Comments: Mild wheezing appreciated bilaterally  Abdominal:     General: Bowel sounds are normal. There is no distension.     Palpations: Abdomen is soft.     Tenderness: There is no abdominal tenderness.  Musculoskeletal:        General: Normal range of motion.     Cervical back: Normal range of motion and neck supple.  Skin:    General: Skin is warm and dry.  Neurological:     General: No focal deficit present.     Mental Status: He is alert.  Psychiatric:        Mood and Affect: Mood normal.        Behavior: Behavior normal.      Consultants:  GI  Procedures:    Data Reviewed: Results for orders placed or performed during the hospital encounter of 09/13/21 (from the past 24 hour(s))  Glucose, capillary     Status: Abnormal   Collection Time: 09/14/21  3:23 PM  Result Value Ref Range   Glucose-Capillary 129 (H) 70 - 99 mg/dL  Hemoglobin and hematocrit, blood     Status: Abnormal   Collection Time: 09/14/21  9:06 PM  Result Value Ref Range   Hemoglobin 8.2 (L) 13.0 - 17.0 g/dL   HCT 28.7 (L) 39.0 - 52.0 %  Glucose, capillary     Status: Abnormal   Collection Time: 09/14/21  9:16 PM  Result Value Ref Range   Glucose-Capillary 268 (H) 70 - 99 mg/dL  CBC     Status: Abnormal   Collection Time: 09/15/21  2:52 AM  Result Value Ref Range   WBC 5.8 4.0 - 10.5 K/uL   RBC 2.52 (L) 4.22 - 5.81 MIL/uL   Hemoglobin 8.4 (L) 13.0 - 17.0 g/dL   HCT 27.8 (L) 39.0 - 52.0 %   MCV 110.3 (H) 80.0 - 100.0 fL   MCH 33.3 26.0 - 34.0 pg   MCHC 30.2 30.0 - 36.0 g/dL   RDW 19.0 (H) 11.5 - 15.5 %   Platelets 235 150 - 400 K/uL   nRBC 0.0 0.0 - 0.2 %  Basic metabolic panel     Status: Abnormal   Collection Time: 09/15/21  2:52 AM  Result Value Ref Range   Sodium 137 135 - 145 mmol/L   Potassium 3.9 3.5 - 5.1 mmol/L    Chloride 98 98 - 111 mmol/L   CO2 29 22 - 32 mmol/L   Glucose, Bld 231 (H) 70 - 99 mg/dL   BUN 24 (H) 8 - 23 mg/dL   Creatinine, Ser 1.11 0.61 - 1.24 mg/dL   Calcium 8.7 (L) 8.9 - 10.3 mg/dL   GFR, Estimated >60 >60 mL/min   Anion gap 10 5 - 15  Troponin I (High Sensitivity)     Status: None   Collection Time: 09/15/21  2:52 AM  Result Value Ref Range   Troponin I (High Sensitivity) 16 <18 ng/L  Troponin I (High Sensitivity)     Status: None   Collection Time: 09/15/21  4:13 AM  Result Value Ref Range   Troponin I (High Sensitivity) 16 <18 ng/L  Glucose, capillary     Status: Abnormal   Collection Time: 09/15/21  7:58 AM  Result Value Ref Range   Glucose-Capillary 238 (H) 70 - 99 mg/dL  Glucose, capillary     Status: Abnormal   Collection Time: 09/15/21 11:35 AM  Result Value Ref Range   Glucose-Capillary 307 (H) 70 - 99 mg/dL   *Note: Due to a large number of results and/or encounters for the requested time period, some results have not been displayed. A complete set of results can be found in Results Review.    I have Reviewed nursing notes, Vitals, and Lab results since pt's last encounter. Pertinent lab results : see above I have ordered test including BMP, CBC, Mg I have reviewed the last note from staff over past 24 hours I have discussed pt's care plan and test results with nursing staff, case manager   LOS: 1 day   Dwyane Dee, MD [facility] 09/15/2021, 2:33 PM

## 2021-09-15 NOTE — Progress Notes (Signed)
Patient refused CPAP hs tonight. Pt states he will wear the [place] tonight.

## 2021-09-15 NOTE — Plan of Care (Signed)
  Problem: Health Behavior/Discharge Planning: Goal: Ability to manage health-related needs will improve Outcome: Progressing   

## 2021-09-15 NOTE — Progress Notes (Addendum)
IV iron transfusion going:  With about 30 mins to go, pt had 11 beat run of VT.  Vitals stable.  No CP nor SOB.  1) stop IV iron -  doubt this the cause, and almost nothing left in the bag (all infused already). 2) check EKG 3) check trop 4) check CBC and BMP  EKG showing a.flutter 4:1, some ST depressions but unchanged from 2 days ago.

## 2021-09-16 ENCOUNTER — Inpatient Hospital Stay (HOSPITAL_COMMUNITY): Payer: Medicare Other | Admitting: Anesthesiology

## 2021-09-16 ENCOUNTER — Encounter (HOSPITAL_COMMUNITY): Payer: Self-pay | Admitting: Internal Medicine

## 2021-09-16 ENCOUNTER — Ambulatory Visit: Payer: Medicare Other | Admitting: Internal Medicine

## 2021-09-16 ENCOUNTER — Encounter (HOSPITAL_COMMUNITY)
Admission: EM | Disposition: A | Discharge status: Home / Self Care | Payer: Self-pay | Source: Home / Self Care | Attending: Internal Medicine

## 2021-09-16 ENCOUNTER — Telehealth: Payer: Self-pay | Admitting: Internal Medicine

## 2021-09-16 DIAGNOSIS — K5520 Angiodysplasia of colon without hemorrhage: Secondary | ICD-10-CM

## 2021-09-16 DIAGNOSIS — K3189 Other diseases of stomach and duodenum: Secondary | ICD-10-CM

## 2021-09-16 DIAGNOSIS — D649 Anemia, unspecified: Secondary | ICD-10-CM | POA: Diagnosis not present

## 2021-09-16 DIAGNOSIS — K922 Gastrointestinal hemorrhage, unspecified: Secondary | ICD-10-CM

## 2021-09-16 DIAGNOSIS — I5033 Acute on chronic diastolic (congestive) heart failure: Secondary | ICD-10-CM | POA: Diagnosis not present

## 2021-09-16 DIAGNOSIS — K2289 Other specified disease of esophagus: Secondary | ICD-10-CM

## 2021-09-16 DIAGNOSIS — K5731 Diverticulosis of large intestine without perforation or abscess with bleeding: Secondary | ICD-10-CM

## 2021-09-16 HISTORY — PX: HOT HEMOSTASIS: SHX5433

## 2021-09-16 HISTORY — PX: BIOPSY: SHX5522

## 2021-09-16 HISTORY — PX: HEMOSTASIS CLIP PLACEMENT: SHX6857

## 2021-09-16 HISTORY — PX: ESOPHAGOGASTRODUODENOSCOPY: SHX5428

## 2021-09-16 HISTORY — PX: COLONOSCOPY WITH PROPOFOL: SHX5780

## 2021-09-16 LAB — BASIC METABOLIC PANEL
Anion gap: 8 (ref 5–15)
BUN: 17 mg/dL (ref 8–23)
CO2: 30 mmol/L (ref 22–32)
Calcium: 8.7 mg/dL — ABNORMAL LOW (ref 8.9–10.3)
Chloride: 104 mmol/L (ref 98–111)
Creatinine, Ser: 0.92 mg/dL (ref 0.61–1.24)
GFR, Estimated: >60 mL/min (ref >60)
Glucose, Bld: 179 mg/dL — ABNORMAL HIGH (ref 70–99)
Potassium: 3.2 mmol/L — ABNORMAL LOW (ref 3.5–5.1)
Sodium: 142 mmol/L (ref 135–145)

## 2021-09-16 LAB — CBC WITH DIFFERENTIAL/PLATELET
Abs Immature Granulocytes: 0.07 10*3/uL (ref 0.00–0.07)
Basophils Absolute: 0 10*3/uL (ref 0.0–0.1)
Basophils Relative: 0 %
Eosinophils Absolute: 0 10*3/uL (ref 0.0–0.5)
Eosinophils Relative: 0 %
HCT: 31.2 % — ABNORMAL LOW (ref 39.0–52.0)
Hemoglobin: 9.2 g/dL — ABNORMAL LOW (ref 13.0–17.0)
Immature Granulocytes: 1 %
Lymphocytes Relative: 2 %
Lymphs Abs: 0.2 10*3/uL — ABNORMAL LOW (ref 0.7–4.0)
MCH: 32.7 pg (ref 26.0–34.0)
MCHC: 29.5 g/dL — ABNORMAL LOW (ref 30.0–36.0)
MCV: 111 fL — ABNORMAL HIGH (ref 80.0–100.0)
Monocytes Absolute: 0.5 10*3/uL (ref 0.1–1.0)
Monocytes Relative: 3 %
Neutro Abs: 13.6 10*3/uL — ABNORMAL HIGH (ref 1.7–7.7)
Neutrophils Relative %: 94 %
Platelets: 285 10*3/uL (ref 150–400)
RBC: 2.81 MIL/uL — ABNORMAL LOW (ref 4.22–5.81)
RDW: 18.6 % — ABNORMAL HIGH (ref 11.5–15.5)
WBC: 14.4 10*3/uL — ABNORMAL HIGH (ref 4.0–10.5)
nRBC: 0 % (ref 0.0–0.2)

## 2021-09-16 LAB — MAGNESIUM: Magnesium: 2.2 mg/dL (ref 1.7–2.4)

## 2021-09-16 LAB — GLUCOSE, CAPILLARY
Glucose-Capillary: 208 mg/dL — ABNORMAL HIGH (ref 70–99)
Glucose-Capillary: 157 mg/dL — ABNORMAL HIGH (ref 70–99)

## 2021-09-16 SURGERY — EGD (ESOPHAGOGASTRODUODENOSCOPY)
Anesthesia: Monitor Anesthesia Care

## 2021-09-16 MED ORDER — METOPROLOL TARTRATE 5 MG/5ML IV SOLN: 5.0000 mg | INTRAVENOUS | Status: DC | PRN

## 2021-09-16 MED ORDER — PROPOFOL 10 MG/ML IV BOLUS
INTRAVENOUS | Status: DC | PRN
  Administered 2021-09-16 (×3): 20 mg via INTRAVENOUS

## 2021-09-16 MED ORDER — PROPOFOL 500 MG/50ML IV EMUL
INTRAVENOUS | Status: DC | PRN
  Administered 2021-09-16: 100 ug/kg/min via INTRAVENOUS

## 2021-09-16 MED ORDER — LACTATED RINGERS IV SOLN
INTRAVENOUS | Status: DC
  Administered 2021-09-16: 1000 mL via INTRAVENOUS

## 2021-09-16 MED ORDER — HEPARIN SOD (PORK) LOCK FLUSH 100 UNIT/ML IV SOLN
500.0000 [IU] | INTRAVENOUS | Status: AC | PRN
  Administered 2021-09-16: 500 [IU]

## 2021-09-16 MED ORDER — PHENYLEPHRINE 80 MCG/ML (10ML) SYRINGE FOR IV PUSH (FOR BLOOD PRESSURE SUPPORT)
PREFILLED_SYRINGE | INTRAVENOUS | Status: DC | PRN
  Administered 2021-09-16 (×6): 80 ug via INTRAVENOUS

## 2021-09-16 SURGICAL SUPPLY — 22 items

## 2021-09-16 NOTE — Telephone Encounter (Signed)
Good morning Dr. Henrene Pastor,  [name] patients friend called stating that patient is currently in the hospital and will not be able to come to his appointment this morning at 11:20. [name] stated they would call back at a later time to reschedule.

## 2021-09-16 NOTE — Consult Note (Signed)
   [facility] Inpatient Consult   09/16/2021  John [name] 20-Mar-1946 [id number]  [facility] [ACO] Patient: Medicare ACO REACH  Primary Care Provider:  [name], FNP, [place], is an embedded provider with a Chronic Care Management team and program, and is listed for the transition of care follow up and appointments.  Patient was reviewed for less than 30 days readmission and as high risk for unplanned readmission score risk.  Patient reviewed for  Embedded practice service needs for chronic care management/care coordination for post hospital. Came to bedside to follow up for needs.  Patient in the bathroom wife states.  Explained post hospital follow up with PCP.  PCP endorsed and follow up appointment card reminder and 24 hour nurse advise line given.  Denies any concerns awaiting to transition home.  Plan: No TOC additional needs for post hospital assessed  Please contact for further questions,  Natividad Brood, RN BSN [facility] Liaison  [phone] business mobile phone Toll free office [phone]  Fax number: [phone] [email or web address]

## 2021-09-16 NOTE — Transfer of Care (Signed)
Immediate Anesthesia Transfer of Care Note  Patient: John [name]  Procedure(s) Performed: ESOPHAGOGASTRODUODENOSCOPY (EGD) COLONOSCOPY WITH PROPOFOL BIOPSY HOT HEMOSTASIS (ARGON PLASMA COAGULATION/BICAP) HEMOSTASIS CLIP PLACEMENT  Patient Location: PACU and Endoscopy Unit  Anesthesia Type:MAC  Level of Consciousness: drowsy and patient cooperative  Airway & Oxygen Therapy: Patient Spontanous Breathing and Patient connected to nasal cannula oxygen  Post-op Assessment: Report given to RN and Post -op Vital signs reviewed and stable  Post vital signs: Reviewed and stable  Last Vitals:  Vitals Value Taken Time  BP 98/41 09/16/21 1132  Temp    Pulse 81 09/16/21 1132  Resp 16 09/16/21 1132  SpO2 95 % 09/16/21 1132  Vitals shown include unvalidated device data.  Last Pain:  Vitals:   09/16/21 1001  TempSrc: Temporal  PainSc: 0-No pain         Complications: No notable events documented.

## 2021-09-16 NOTE — Interval H&P Note (Signed)
History and Physical Interval Note:  09/16/2021 10:43 AM  John [name]  has presented today for surgery, with the diagnosis of IDA, Heme positive stool, Hx GU, Hx Cecal AVM.  The various methods of treatment have been discussed with the patient and family. After consideration of risks, benefits and other options for treatment, the patient has consented to  Procedure(s): ESOPHAGOGASTRODUODENOSCOPY (EGD) (N/A) COLONOSCOPY WITH PROPOFOL (N/A) as a surgical intervention.  The patient's history has been reviewed, patient examined, no change in status, stable for surgery.  I have reviewed the patient's chart and labs.  Questions were answered to the patient's satisfaction.     Silvano Rusk

## 2021-09-16 NOTE — Discharge Instructions (Signed)
Resume Eliquis on 09/17/21

## 2021-09-16 NOTE — Inpatient Diabetes Management (Signed)
Inpatient Diabetes Program Recommendations  AACE/ADA: New Consensus Statement on Inpatient Glycemic Control (2015)  Target Ranges:  Prepandial:   less than 140 mg/dL      Peak postprandial:   less than 180 mg/dL (1-2 hours)      Critically ill patients:  140 - 180 mg/dL   Lab Results  Component Value Date   GLUCAP 208 (H) 09/16/2021   HGBA1C 5.4 08/25/2021    Review of Glycemic Control  Latest Reference Range & Units 09/15/21 07:58 09/15/21 11:35 09/15/21 15:29 09/15/21 21:07 09/16/21 07:30  Glucose-Capillary 70 - 99 mg/dL 238 (H) 307 (H) 278 (H) 158 (H) 208 (H)   Diabetes history: DM 2 Outpatient Diabetes medications: none Current orders for Inpatient glycemic control:  Novolog 0-9 units tid  Solumedrol 40 mg bid  Inpatient Diabetes Program Recommendations:    -   Consider increasing Novolog to 0-15 units tid and add Novolog hs scale  Thanks,  Tama Headings RN, MSN, BC-ADM Inpatient Diabetes Coordinator Team Pager [phone] (8a-5p)

## 2021-09-16 NOTE — Anesthesia Postprocedure Evaluation (Signed)
Anesthesia Post Note  Patient: John [name]  Procedure(s) Performed: ESOPHAGOGASTRODUODENOSCOPY (EGD) COLONOSCOPY WITH PROPOFOL BIOPSY HOT HEMOSTASIS (ARGON PLASMA COAGULATION/BICAP) HEMOSTASIS CLIP PLACEMENT     Patient location during evaluation: Endoscopy Anesthesia Type: MAC Level of consciousness: awake and alert Pain management: pain level controlled Vital Signs Assessment: post-procedure vital signs reviewed and stable Respiratory status: spontaneous breathing and respiratory function stable Cardiovascular status: stable Postop Assessment: no apparent nausea or vomiting Anesthetic complications: no   No notable events documented.  Last Vitals:  Vitals:   09/16/21 1150 09/16/21 1220  BP: (!) 116/47 (!) 102/50  Pulse: 82 81  Resp:  18  Temp:  36.6 C  SpO2: 92% (!) 84%    Last Pain:  Vitals:   09/16/21 1220  TempSrc: Oral  PainSc:                  [place]

## 2021-09-16 NOTE — Anesthesia Procedure Notes (Signed)
Procedure Name: MAC Date/Time: 09/16/2021 10:45 AM  Performed by: Jenne Campus, CRNAPre-anesthesia Checklist: Patient identified, Emergency Drugs available, Suction available and Patient being monitored

## 2021-09-16 NOTE — Anesthesia Preprocedure Evaluation (Addendum)
Anesthesia Evaluation  Patient identified by MRN, date of birth, ID band Patient awake    Reviewed: Allergy & Precautions, NPO status , Patient's Chart, lab work & pertinent test results, reviewed documented beta blocker date and time   History of Anesthesia Complications Negative for: history of anesthetic complications  Airway Mallampati: II  TM Distance: >3 FB Neck ROM: Full    Dental  (+) Edentulous Upper, Edentulous Lower, Dental Advisory Given   Pulmonary sleep apnea , COPD,  COPD inhaler, Current Smoker and Patient abstained from smoking.,  [place] lung   Pulmonary exam normal        Cardiovascular hypertension, Pt. on medications and Pt. on home beta blockers + Peripheral Vascular Disease and +CHF  Normal cardiovascular exam  TTE 09/2021 IMPRESSIONS    1. Aortic valve calcified with restricted motion but no AS by doppler.  2. Left ventricular ejection fraction, by estimation, is 55 to 60%. The  left ventricle has normal function. The left ventricle has no regional  wall motion abnormalities. Left ventricular diastolic parameters are  indeterminate.  3. Right ventricular systolic function is normal. The right ventricular  size is normal. There is mildly elevated pulmonary artery systolic  pressure.  4. Left atrial size was mildly dilated.  5. The mitral valve is normal in structure. No evidence of mitral valve  regurgitation. No evidence of mitral stenosis. Moderate mitral annular  calcification.  6. The aortic valve is calcified. Aortic valve regurgitation is trivial.  Aortic valve sclerosis/calcification is present, without any evidence of  aortic stenosis.  7. The inferior vena cava is dilated in size with <50% respiratory  variability, suggesting right atrial pressure of 15 mmHg.   TTE 08/2020: EF 19-14%, RV systolic function mildly reduced, aortic valve area by VTI measures 0.91 cm     Neuro/Psych Anxiety Depression    GI/Hepatic Neg liver ROS, PUD, GERD  Medicated and Controlled,  Endo/Other  diabetes, Type 2, Oral Hypoglycemic AgentsHypothyroidism   Renal/GU negative Renal ROS  negative genitourinary   Musculoskeletal  (+) Arthritis ,   Abdominal   Peds  Hematology  (+) Blood dyscrasia, anemia , Hgb 11.5   Anesthesia Other Findings Day of surgery medications reviewed with patient.  Reproductive/Obstetrics                            Anesthesia Physical  Anesthesia Plan  ASA: 4  Anesthesia Plan: MAC   Post-op Pain Management: Minimal or no pain anticipated   Induction:   PONV Risk Score and Plan: Treatment may vary due to age or medical condition and Propofol infusion  Airway Management Planned: Natural Airway and Nasal Cannula  Additional Equipment: None  Intra-op Plan:   Post-operative Plan:   Informed Consent: I have reviewed the patients History and Physical, chart, labs and discussed the procedure including the risks, benefits and alternatives for the proposed anesthesia with the patient or authorized representative who has indicated his/her understanding and acceptance.     Dental advisory given  Plan Discussed with: Anesthesiologist and CRNA  Anesthesia Plan Comments:        Anesthesia Quick Evaluation

## 2021-09-16 NOTE — Discharge Summary (Signed)
Physician Discharge Summary   John [name]:[id number] DOB: 1945-10-23 DOA: 09/13/2021  PCP: [name], FNP  Admit date: 09/13/2021 Discharge date: 09/16/2021   Admitted From: Home Disposition:  Home Discharging physician: Dwyane Dee, MD  Recommendations for Outpatient Follow-up:  Continue wit outpatient CT chest scheduled in Sept given CXR read noted on admission Follow up with GI if further Hgb drop Repeat iron studies in 3-4 months  Home Health:  Equipment/Devices:   Discharge Condition: stable CODE STATUS: Full Diet recommendation:  Diet Orders (From admission, onward)     Start     Ordered   09/16/21 1209  Diet Heart Room service appropriate? Yes; Fluid consistency: Thin  Diet effective now       Question Answer Comment  Room service appropriate? Yes   Fluid consistency: Thin      09/16/21 1210   09/16/21 0000  Diet - low sodium heart healthy        09/16/21 1215   09/16/21 0000  Diet Carb Modified        09/16/21 1215            Hospital Course: John PIECZYNSKI is a 76 y.o. male with PMH afib/flutter on Eliquis, HFpEF (last EF 60-65% 08/21/2020), COPD, PAD (s/p right femoral endarterectomy, right common iliac stenting), stage IIIa non-small cell lung cancer (s/p chemo and radiation therapy), T2DM, HTN, HLD, hypothyroidism, OSA on CPAP who was admitted with SOB.  There was concern for CHF exacerbation and possible contribution from underlying worsening anemia.  He was recently evaluated with outpatient blood work which revealed a low hemoglobin and he was recommended to present to the ER for further work-up. CT abdomen/pelvis was negative for obvious source of GI bleeding.  Small right pleural effusion was appreciated.  BNP was mildly elevated.  He was started on IV Lasix and admitted for further monitoring.  He responded well to diuresis and had improvement in respiratory status and oxygen was able to be weaned further prior to discharge.  Other  concern on admission was his downtrending hemoglobin and low iron stores.  He has a history of nonbleeding cecal angioectasia not previously treated and diverticulosis just found in March 2023.  Prior EGD had also shown small gastric ulcer in 2022. He underwent blood transfusion and iron transfusion on admission.  He was evaluated by GI and underwent EGD and colonoscopy on 09/16/2021. EGD showed gastric erythema but not felt to be the cause of his anemia.  Colonoscopy found a small angiodysplastic lesion in the cecum treated with APC successfully. Hemoglobin and vitals remained stable and he was considered stable for discharging home.  Assessment and Plan: * Acute on chronic heart failure with preserved ejection fraction (HFpEF) (HCC) - Dyspnea likely multifactorial from acute on chronic HFpEF, acute on chronic anemia, and underlying COPD.  Appeared mildly volume overloaded on admission.  BNP 267.8.  Last TTE 08/21/2020 showed EF 60-65%. -Treated with IV Lasix with good response and resumed back on home torsemide -Echo performed this admission: EF 55 to 60%, no RWMA, indeterminate diastolic parameters  Acute on chronic anemia Baseline hemoglobin 10-11 g/dL -Patient endorsed dark stools but states he is also on oral iron at home - Eliquis and aspirin initially held -Hemoglobin improved after 1 unit PRBC on 09/14/2021 - Also received INFeD on 09/14/2021 for low iron stores -Hemoglobin 9.2 g/dL at discharge -No considerable findings on EGD and colonoscopy.  Gastric erythema found on EGD and small angiodysplastic lesion found on colonoscopy  and treated with APC - continue PPI - resuming Eliquis on 6/13  Paroxysmal atrial flutter ([place]) Remains in atrial flutter with 4:1 block but otherwise controlled rate.  Continue nebivolol.  -Eliquis resumed at discharge to be restarted on 09/17/2021  COPD (chronic obstructive pulmonary disease) (HCC) - Some wheezing appreciated on exam after admission; treated  with IV steroids in the hospital but not continued at discharge - Change albuterol to DuoNeb's - Continue Trelegy  Type 2 diabetes mellitus with hyperglycemia, without long-term current use of insulin ([place]) Well-controlled with last A1c 5.4% 08/25/2021.   -Home regimen resumed at discharge  Mixed diabetic hyperlipidemia associated with type 2 diabetes mellitus ([employer]) Continue pravastatin.  Hypertension associated with diabetes ([place]) Continue nebivolol  -Torsemide resumed at discharge  Hypothyroidism Continue Synthroid.  Non-small cell lung cancer, right [facility]) S/p chemotherapy and radiation.  Follows with Dr. Delton Coombes.  CXR shows progressive right posterior perihilar pulmonary opacity, possibly representing postradiation change.  Considered nonemergent contrast-enhanced CT for better assessment. -Appears that patient has outpatient CT chest scheduled for 12/24/2021 already  PAD (peripheral artery disease) (HCC) - Continue statin and aspirin at discharge  Obstructive sleep apnea treated with continuous positive airway pressure (CPAP) Continue CPAP nightly.    The patient's chronic medical conditions were treated accordingly per the patient's home medication regimen except as noted.  On day of discharge, patient was felt deemed stable for discharge. Patient/family member advised to call PCP or come back to ER if needed.   Principal Diagnosis: Acute on chronic heart failure with preserved ejection fraction (HFpEF) ([place])  Discharge Diagnoses: Principal Problem:   Acute on chronic heart failure with preserved ejection fraction (HFpEF) (HCC) Active Problems:   Acute on chronic anemia   COPD (chronic obstructive pulmonary disease) (HCC)   Paroxysmal atrial flutter (HCC)   Type 2 diabetes mellitus with hyperglycemia, without long-term current use of insulin ([place])   Hypertension associated with diabetes ([place])   Mixed diabetic hyperlipidemia associated with type 2 diabetes mellitus  (HCC)   Hypothyroidism   Non-small cell lung cancer, right (HCC)   PAD (peripheral artery disease) (HCC)   Obstructive sleep apnea treated with continuous positive airway pressure (CPAP)   GIB (gastrointestinal bleeding)   Discharge Instructions     Diet - low sodium heart healthy   Complete by: As directed    Diet Carb Modified   Complete by: As directed    Increase activity slowly   Complete by: As directed       Allergies as of 09/16/2021       Reactions   Sulfa Antibiotics Swelling   Mouth swelling   Atorvastatin Other (See Comments)   Muscle aches - tolerating Pravastatin 40 mg MWF   Jardiance [empagliflozin] Other (See Comments)   FEELS SLUGGISH, TIRED   Lopressor [metoprolol] Other (See Comments)   Fatigue   Rosuvastatin Other (See Comments)   Muscle aches - tolerating Pravastatin 40 mg MWF   Doxycycline Nausea Only   Temazepam Other (See Comments)   Made insomnia worse         Medication List     STOP taking these medications    doxycycline 100 MG tablet Commonly known as: VIBRA-TABS   nystatin 100000 UNIT/ML suspension Commonly known as: MYCOSTATIN       TAKE these medications    acetaminophen 650 MG CR tablet Commonly known as: TYLENOL Take 1,300 mg by mouth every 8 (eight) hours as needed for pain.   albuterol 108 (90 Base) MCG/ACT inhaler  Commonly known as: VENTOLIN HFA Inhale 2 puffs into the lungs every 4 (four) hours as needed for wheezing or shortness of breath.   albuterol (2.5 MG/3ML) 0.083% nebulizer solution Commonly known as: PROVENTIL Take 3 mLs (2.5 mg total) by nebulization every 4 (four) hours as needed for wheezing or shortness of breath (if not able to take inhaler).   aspirin EC 81 MG tablet Take 81 mg by mouth daily. Swallow whole.   B-12 PO Take 1 tablet by mouth daily.   Eliquis 5 MG Tabs tablet Generic drug: apixaban TAKE 1 TABLET BY MOUTH TWICE A DAY What changed: how much to take   fluticasone 50 MCG/ACT  nasal spray Commonly known as: FLONASE PLACE 1 SPRAY INTO BOTH NOSTRILS DAILY AS NEEDED FOR ALLERGIES OR RHINITIS.   gabapentin 300 MG capsule Commonly known as: NEURONTIN TAKE 1 CAPSULE BY MOUTH THREE TIMES A DAY What changed: See the new instructions.   Iron (Ferrous Sulfate) 325 (65 Fe) MG Tabs Take 325 mg by mouth 2 (two) times daily. What changed: when to take this   Klor-Con M20 20 MEQ tablet Generic drug: potassium chloride SA TAKE 1 TABLET BY MOUTH EVERY DAY What changed: how much to take   levothyroxine 50 MCG tablet Commonly known as: SYNTHROID TAKE 1 TABLET BY MOUTH EVERY DAY What changed: when to take this   linaclotide 72 MCG capsule Commonly known as: Linzess Take 1 capsule (72 mcg total) by mouth daily before breakfast. What changed:  when to take this reasons to take this   Melatonin 10 MG Tabs Take 10 mg by mouth at bedtime as needed (sleep).   nebivolol 2.5 MG tablet Commonly known as: BYSTOLIC TAKE 1 TABLET BY MOUTH EVERY DAY   NON FORMULARY CPAP at bedtime   ondansetron 4 MG tablet Commonly known as: Zofran Take 1 tablet (4 mg total) by mouth every 8 (eight) hours as needed for nausea or vomiting.   OneTouch Delica Lancets 81L Misc Use to test blood sugars daily as directed. DX: E11.9   OneTouch Verio Reflect w/Device Kit Use to test blood sugars daily as directed. DX: E11.9   OneTouch Verio test strip Generic drug: glucose blood USE TO TEST BLOOD SUGARS DAILY AS DIRECTED. DX: E11.9   OXYGEN Inhale 2 L into the lungs continuous.   pantoprazole 40 MG tablet Commonly known as: Protonix Take 1 tablet (40 mg total) by mouth daily.   pravastatin 40 MG tablet Commonly known as: PRAVACHOL TAKE 1 TABLET (40 MG TOTAL) BY MOUTH ON MONDAYS , WEDNESDAYS, AND FRIDAYS AS DIRECTED What changed: See the new instructions.   Rybelsus 7 MG Tabs Generic drug: Semaglutide Take 7 mg by mouth daily.   torsemide 20 MG tablet Commonly known as:  DEMADEX Take 2 tablets (40 mg) in the morning, take 2 tablets (40 mg) in the evening daily as needed. What changed:  how much to take how to take this when to take this additional instructions   traZODone 50 MG tablet Commonly known as: DESYREL Take 0.5 tablets (25 mg total) by mouth at bedtime.   Trelegy Ellipta 100-62.5-25 MCG/ACT Aepb Generic drug: Fluticasone-Umeclidin-Vilant Inhale 1 puff into the lungs daily.        Allergies  Allergen Reactions   Sulfa Antibiotics Swelling    Mouth swelling   Atorvastatin Other (See Comments)    Muscle aches - tolerating Pravastatin 40 mg MWF   Jardiance [Empagliflozin] Other (See Comments)    FEELS SLUGGISH, TIRED  Lopressor [Metoprolol] Other (See Comments)    Fatigue   Rosuvastatin Other (See Comments)    Muscle aches - tolerating Pravastatin 40 mg MWF   Doxycycline Nausea Only   Temazepam Other (See Comments)    Made insomnia worse     Consultations: GI  Procedures: EGD and Colonoscopy, 09/16/21  Discharge Exam: BP (!) 102/50 (BP Location: Right Arm)   Pulse 81   Temp 97.9 F (36.6 C) (Oral)   Resp 18   Ht 5' 6"  (1.676 m)   Wt 72.6 kg   SpO2 92%   BMI 25.82 kg/m  Physical Exam Constitutional:      General: He is not in acute distress.    Appearance: Normal appearance.  HENT:     Head: Normocephalic and atraumatic.     Mouth/Throat:     Mouth: Mucous membranes are moist.  Eyes:     Extraocular Movements: Extraocular movements intact.  Cardiovascular:     Rate and Rhythm: Normal rate and regular rhythm.     Heart sounds: Normal heart sounds.  Pulmonary:     Effort: Pulmonary effort is normal. No respiratory distress.     Breath sounds: No wheezing or rales.  Abdominal:     General: Bowel sounds are normal. There is no distension.     Palpations: Abdomen is soft.     Tenderness: There is no abdominal tenderness.  Musculoskeletal:        General: Normal range of motion.     Cervical back: Normal range  of motion and neck supple.  Skin:    General: Skin is warm and dry.  Neurological:     General: No focal deficit present.     Mental Status: He is alert.  Psychiatric:        Mood and Affect: Mood normal.        Behavior: Behavior normal.      The results of significant diagnostics from this hospitalization (including imaging, microbiology, ancillary and laboratory) are listed below for reference.   Microbiology: No results found for this or any previous visit (from the past 240 hour(s)).   Labs: BNP (last 3 results) Recent Labs    10/16/20 1417 08/25/21 1716 09/13/21 2150  BNP 228.9* 407.4* 735.6*   Basic Metabolic Panel: Recent Labs  Lab 09/13/21 1525 09/14/21 0706 09/15/21 0252 09/16/21 0833  NA 138 139 137 142  K 4.3 4.0 3.9 3.2*  CL 97* 99 98 104  CO2 27 31 29 30   GLUCOSE 147* 163* 231* 179*  BUN 32* 27* 24* 17  CREATININE 1.14 1.16 1.11 0.92  CALCIUM 9.2 8.6* 8.7* 8.7*  MG  --  2.0  --  2.2   Liver Function Tests: No results for input(s): "AST", "ALT", "ALKPHOS", "BILITOT", "PROT", "ALBUMIN" in the last 168 hours. No results for input(s): "LIPASE", "AMYLASE" in the last 168 hours. No results for input(s): "AMMONIA" in the last 168 hours. CBC: Recent Labs  Lab 09/10/21 1435 09/12/21 1548 09/13/21 1525 09/14/21 0706 09/14/21 2106 09/15/21 0252 09/16/21 0833  WBC 10.1 9.9 9.6 8.2  --  5.8 14.4*  NEUTROABS 8.6* 7.7* 7.8*  --   --   --  13.6*  HGB 8.5* 7.8* 8.6* 7.0* 8.2* 8.4* 9.2*  HCT 27.1* 25.3* 30.9* 23.9* 28.7* 27.8* 31.2*  MCV 105* 106* 119.8* 113.3*  --  110.3* 111.0*  PLT 286 281 266 249  --  235 285   Cardiac Enzymes: No results for input(s): "CKTOTAL", "CKMB", "CKMBINDEX", "TROPONINI" in  the last 168 hours. BNP: Invalid input(s): "POCBNP" CBG: Recent Labs  Lab 09/15/21 1135 09/15/21 1529 09/15/21 2107 09/16/21 0730 09/16/21 1219  GLUCAP 307* 278* 158* 208* 157*   D-Dimer No results for input(s): "DDIMER" in the last 72  hours. Hgb A1c No results for input(s): "HGBA1C" in the last 72 hours. Lipid Profile No results for input(s): "CHOL", "HDL", "LDLCALC", "TRIG", "CHOLHDL", "LDLDIRECT" in the last 72 hours. Thyroid function studies No results for input(s): "TSH", "T4TOTAL", "T3FREE", "THYROIDAB" in the last 72 hours.  Invalid input(s): "FREET3" Anemia work up Recent Labs    09/14/21 0706  VITAMINB12 3,634*  FOLATE 17.6  FERRITIN 30  TIBC 441  IRON 21*   Urinalysis    Component Value Date/Time   COLORURINE STRAW (A) 08/14/2020 1122   APPEARANCEUR Clear 09/05/2021 1557   LABSPEC 1.008 08/14/2020 1122   PHURINE 7.0 08/14/2020 1122   GLUCOSEU Negative 09/05/2021 1557   HGBUR NEGATIVE 08/14/2020 1122   BILIRUBINUR Negative 09/05/2021 1557   KETONESUR NEGATIVE 08/14/2020 1122   PROTEINUR Negative 09/05/2021 1557   PROTEINUR NEGATIVE 08/14/2020 1122   NITRITE Negative 09/05/2021 1557   NITRITE NEGATIVE 08/14/2020 1122   LEUKOCYTESUR Negative 09/05/2021 1557   LEUKOCYTESUR NEGATIVE 08/14/2020 1122   Sepsis Labs Recent Labs  Lab 09/13/21 1525 09/14/21 0706 09/15/21 0252 09/16/21 0833  WBC 9.6 8.2 5.8 14.4*   Microbiology No results found for this or any previous visit (from the past 240 hour(s)).  Procedures/Studies: ECHOCARDIOGRAM COMPLETE  Result Date: 09/15/2021    ECHOCARDIOGRAM REPORT   Patient Name:   John [name] Date of Exam: 09/15/2021 Medical Rec #:  [id number]        Height:       66.0 in Accession #:    [id number]       Weight:       164.8 lb Date of Birth:  1945-11-04        BSA:          1.842 m Patient Age:    76 years         BP:           117/45 mmHg Patient Gender: M                HR:           79 bpm. Exam Location:  Inpatient Procedure: 2D Echo, Cardiac Doppler, Color Doppler and Intracardiac            Opacification Agent Indications:    CHF  History:        Patient has prior history of Echocardiogram examinations, most                 recent 08/21/2020. COPD; Risk  Factors:Hypertension, Diabetes and                 Sleep Apnea.  Sonographer:    Jefferey Pica Referring Phys: [id number] [place]  1. Aortic valve calcified with restricted motion but no AS by doppler.  2. Left ventricular ejection fraction, by estimation, is 55 to 60%. The left ventricle has normal function. The left ventricle has no regional wall motion abnormalities. Left ventricular diastolic parameters are indeterminate.  3. Right ventricular systolic function is normal. The right ventricular size is normal. There is mildly elevated pulmonary artery systolic pressure.  4. Left atrial size was mildly dilated.  5. The mitral valve is normal in structure. No evidence of mitral valve regurgitation. No evidence of  mitral stenosis. Moderate mitral annular calcification.  6. The aortic valve is calcified. Aortic valve regurgitation is trivial. Aortic valve sclerosis/calcification is present, without any evidence of aortic stenosis.  7. The inferior vena cava is dilated in size with <50% respiratory variability, suggesting right atrial pressure of 15 mmHg. FINDINGS  Left Ventricle: Left ventricular ejection fraction, by estimation, is 55 to 60%. The left ventricle has normal function. The left ventricle has no regional wall motion abnormalities. The left ventricular internal cavity size was normal in size. There is  no left ventricular hypertrophy. Left ventricular diastolic parameters are indeterminate. Right Ventricle: The right ventricular size is normal. There is mildly elevated pulmonary artery systolic pressure. The tricuspid regurgitant velocity is 2.50 m/s, and with an assumed right atrial pressure of 15 mmHg, the estimated right ventricular systolic pressure is 08.6 mmHg. Left Atrium: Left atrial size was mildly dilated. Right Atrium: Right atrial size was normal in size. Pericardium: There is no evidence of pericardial effusion. Mitral Valve: The mitral valve is normal in structure.  Moderate mitral annular calcification. No evidence of mitral valve regurgitation. No evidence of mitral valve stenosis. MV peak gradient, 9.1 mmHg. The mean mitral valve gradient is 3.0 mmHg. Tricuspid Valve: The tricuspid valve is normal in structure. Tricuspid valve regurgitation is trivial. No evidence of tricuspid stenosis. Aortic Valve: The aortic valve is calcified. Aortic valve regurgitation is trivial. Aortic valve sclerosis/calcification is present, without any evidence of aortic stenosis. Aortic valve peak gradient measures 11.5 mmHg. Pulmonic Valve: The pulmonic valve was normal in structure. Pulmonic valve regurgitation is not visualized. No evidence of pulmonic stenosis. Aorta: The aortic root is normal in size and structure. Venous: The inferior vena cava is dilated in size with less than 50% respiratory variability, suggesting right atrial pressure of 15 mmHg. IAS/Shunts: No atrial level shunt detected by color flow Doppler. Additional Comments: Aortic valve calcified with restricted motion but no AS by doppler.  LEFT VENTRICLE PLAX 2D LVIDd:         4.00 cm   Diastology LVIDs:         3.40 cm   LV e' lateral:   8.03 cm/s LV PW:         0.90 cm   LV E/e' lateral: 19.8 LV IVS:        0.90 cm LVOT diam:     1.90 cm LV SV:         67 LV SV Index:   36 LVOT Area:     2.84 cm  IVC IVC diam: 2.50 cm LEFT ATRIUM             Index        RIGHT ATRIUM           Index LA diam:        4.20 cm 2.28 cm/m   RA Area:     16.70 cm LA Vol (A2C):   71.7 ml 38.93 ml/m  RA Volume:   42.50 ml  23.07 ml/m LA Vol (A4C):   56.3 ml 30.57 ml/m LA Biplane Vol: 65.1 ml 35.34 ml/m  AORTIC VALVE                 PULMONIC VALVE AV Area (Vmax): 1.87 cm     PV Vmax:       1.02 m/s AV Vmax:        169.50 cm/s  PV Peak grad:  4.2 mmHg AV Peak Grad:   11.5 mmHg LVOT Vmax:  112.00 cm/s LVOT Vmean:     70.200 cm/s LVOT VTI:       0.237 m  AORTA Ao Root diam: 3.20 cm MITRAL VALVE                TRICUSPID VALVE MV Area (PHT):  5.97 cm     TR Peak grad:   25.0 mmHg MV Area VTI:   2.20 cm     TR Vmax:        250.00 cm/s MV Peak grad:  9.1 mmHg MV Mean grad:  3.0 mmHg     SHUNTS MV Vmax:       1.51 m/s     Systemic VTI:  0.24 m MV Vmean:      81.0 cm/s    Systemic Diam: 1.90 cm MV Decel Time: 127 msec MV E velocity: 159.00 cm/s MV A velocity: 60.60 cm/s MV E/A ratio:  2.62 Kirk Ruths MD Electronically signed by Kirk Ruths MD Signature Date/Time: 09/15/2021/11:42:39 AM    Final    CT Abdomen Pelvis W Contrast  Result Date: 09/13/2021 CLINICAL DATA:  Abdominal pain. Acute nonlocalized. Bleeding ulcer. Hypotension. Low hemoglobin. Non-small cell lung cancer. * Tracking Code: BO * EXAM: CT ABDOMEN AND PELVIS WITH CONTRAST TECHNIQUE: Multidetector CT imaging of the abdomen and pelvis was performed using the standard protocol following bolus administration of intravenous contrast. RADIATION DOSE REDUCTION: This exam was performed according to the departmental dose-optimization program which includes automated exposure control, adjustment of the mA and/or kV according to patient size and/or use of iterative reconstruction technique. CONTRAST:  153m OMNIPAQUE IOHEXOL 300 MG/ML  SOLN COMPARISON:  Chest CT 06/19/2021, PET-CT 08/13/2020 FINDINGS: Lower chest: 5 mm nodule in the LEFT lower lobe (image 1/4 present on comparison PET-CT scan from 08/26/2020. No change in size. New RIGHT pleural effusion compared to most recent CT chest Hepatobiliary: No focal hepatic lesion. No biliary duct dilatation. Common bile duct is normal. Pancreas: Pancreas is normal. No ductal dilatation. No pancreatic inflammation. Spleen: Normal spleen Adrenals/urinary tract: Adrenal glands normal. Kidneys ureters and bladder are unchanged. 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 No change from multiple comparison exams Stomach/Bowel: Stomach is normal. Duodenum small-bowel normal. Appendix normal. Ascending, transverse  and descending colon normal. No evidence of gastrointestinal bleeding, inflammation, or infection the GI tract. Vascular/Lymphatic: Abdominal aorta is normal caliber with atherosclerotic calcification. There is no retroperitoneal or periportal lymphadenopathy. No pelvic lymphadenopathy. Reproductive: TURP defect in the prostate gland Other: No intraperitoneal free fluid. Musculoskeletal: No aggressive osseous lesion. IMPRESSION: 1. No source of gastrointestinal bleeding identified. 2. No inflammation or infection identified in the GI tract. 3. New small RIGHT effusion. 4. Stable LEFT lobe pulmonary nodule. Electronically Signed   By: SSuzy BouchardM.D.   On: 09/13/2021 21:43   DG Chest 2 View  Result Date: 09/13/2021 CLINICAL DATA:  Weakness EXAM: CHEST - 2 VIEW COMPARISON:  None Available. FINDINGS: Pulmonary insufflation is normal and symmetric. There is parenchymal scarring within the lung apices noted. Right perihilar pulmonary opacity appears more prominent and may reflect post radiation changes noted on CT examination of 06/19/2021, but is not optimally assessed on this examination. No pneumothorax or pleural effusion. Cardiac size within normal limits. Prominent mitral valve annular calcification noted. Pulmonary vascularity is normal. No acute bone abnormality. IMPRESSION: Progressive right posterior perihilar pulmonary opacity, possibly representing post radiation change. This could be better assessed with dedicated nonemergent contrast enhanced CT imaging No radiographic  evidence of acute cardiopulmonary disease. Electronically Signed   By: Fidela Salisbury M.D.   On: 09/13/2021 19:38   VAS US CAROTID  Result Date: 08/26/2021 Carotid Arterial Duplex Study Patient Name:  John [name]  Date of Exam:   08/26/2021 Medical Rec #: [id number]         Accession #:    [id number] Date of Birth: 05-Jan-1946         Patient Gender: M Patient Age:   39 years Exam Location:  [facility] Procedure:       VAS US CAROTID Referring Phys: Inda Merlin --------------------------------------------------------------------------------  Indications:       CVA. Risk Factors:      Hypertension, hyperlipidemia, Diabetes, current smoker. Comparison Study:  No prior study Performing Technologist: Maudry Mayhew MHA, RDMS, RVT, RDCS  Examination Guidelines: A complete evaluation includes B-mode imaging, spectral Doppler, color Doppler, and power Doppler as needed of all accessible portions of each vessel. Bilateral testing is considered an integral part of a complete examination. Limited examinations for reoccurring indications may be performed as noted.  Right Carotid Findings: +----------+--------+--------+--------+------------------------------+---------+           PSV cm/sEDV cm/sStenosisPlaque Description            Comments  +----------+--------+--------+--------+------------------------------+---------+ CCA Prox  89      14              irregular and heterogenous              +----------+--------+--------+--------+------------------------------+---------+ CCA Distal63      14              heterogenous and calcific     Shadowing +----------+--------+--------+--------+------------------------------+---------+ ICA Prox  183     35              irregular, heterogenous and   Shadowing                                   calcific                                +----------+--------+--------+--------+------------------------------+---------+ ICA Distal61      16                                                      +----------+--------+--------+--------+------------------------------+---------+ ECA       129     8               irregular, heterogenous and                                               calcific                                +----------+--------+--------+--------+------------------------------+---------+  +----------+--------+-------+----------------+-------------------+           PSV cm/sEDV cmsDescribe        Arm Pressure (mmHG) +----------+--------+-------+----------------+-------------------+ Subclavian120            Multiphasic, WNL                    +----------+--------+-------+----------------+-------------------+ +---------+--------+--+--------+--+---------+  VertebralPSV cm/s60EDV cm/s14Antegrade +---------+--------+--+--------+--+---------+  Left Carotid Findings: +----------+--------+--------+--------+------------------------------+---------+           PSV cm/sEDV cm/sStenosisPlaque Description            Comments  +----------+--------+--------+--------+------------------------------+---------+ CCA Prox  106     21              smooth and heterogenous                 +----------+--------+--------+--------+------------------------------+---------+ CCA Distal88      19              heterogenous and irregular              +----------+--------+--------+--------+------------------------------+---------+ ICA Prox  97      23              heterogenous, irregular and   Shadowing                                   calcific                                +----------+--------+--------+--------+------------------------------+---------+ ICA Distal82      23                                                      +----------+--------+--------+--------+------------------------------+---------+ ECA       159     17              focal and heterogenous                  +----------+--------+--------+--------+------------------------------+---------+ +----------+--------+--------+----------------+-------------------+           PSV cm/sEDV cm/sDescribe        Arm Pressure (mmHG) +----------+--------+--------+----------------+-------------------+ [id number]             Multiphasic, WNL                     +----------+--------+--------+----------------+-------------------+ +---------+--------+--+--------+-+---------+ VertebralPSV cm/s41EDV cm/s9Antegrade +---------+--------+--+--------+-+---------+   Summary: Right Carotid: Velocities in the right ICA are consistent with an upper range                1-39% stenosis. Left Carotid: Velocities in the left ICA are consistent with a 1-39% stenosis. Vertebrals:  Bilateral vertebral arteries demonstrate antegrade flow. Subclavians: Normal flow hemodynamics were seen in bilateral subclavian              arteries. *See table(s) above for measurements and observations.  Electronically signed by Monica Martinez MD on 08/26/2021 at 5:19:26 PM.    Final    CT Head Wo Contrast  Result Date: 08/25/2021 CLINICAL DATA:  Head trauma, moderate-severe fall on thinners; Neck trauma (Age >= 65y) EXAM: CT HEAD WITHOUT CONTRAST CT CERVICAL SPINE WITHOUT CONTRAST TECHNIQUE: Multidetector CT imaging of the head and cervical spine was performed following the standard protocol without intravenous contrast. Multiplanar CT image reconstructions of the cervical spine were also generated. RADIATION DOSE REDUCTION: This exam was performed according to the departmental dose-optimization program which includes automated exposure control, adjustment of the mA and/or kV according to patient size and/or use of iterative reconstruction technique. COMPARISON:  11/30/2020, 06/19/2021 FINDINGS: CT HEAD FINDINGS Brain: No evidence of acute  infarction, hemorrhage, hydrocephalus, extra-axial collection or mass lesion/mass effect. Patchy low-density changes within the periventricular and subcortical white matter compatible with chronic microvascular ischemic change. Mild diffuse cerebral volume loss. Vascular: Atherosclerotic calcifications involving the large vessels of the skull base. No unexpected hyperdense vessel. Skull: Normal. Negative for fracture or focal lesion. Sinuses/Orbits: No acute  finding. Other: Negative for scalp hematoma. CT CERVICAL SPINE FINDINGS Alignment: Facet joints are aligned without dislocation or traumatic listhesis. Dens and lateral masses are aligned. Skull base and vertebrae: No acute fracture. No primary bone lesion or focal pathologic process. Soft tissues and spinal canal: No prevertebral fluid or swelling. No visible canal hematoma. Disc levels: Degenerative disc disease is most pronounced at C5-6 and C6-7. Multilevel bilateral facet arthropathy is worse on the left. Upper chest: Partially visualized radiation changes within the included bilateral lung apices, similar in appearance to chest CT 06/19/2021. Other: Bilateral carotid atherosclerosis with suspected high-grade stenosis on the right (series 5, image 52). IMPRESSION: 1. No acute intracranial abnormality. 2. No acute cervical spine fracture or subluxation. 3. Chronic microvascular ischemic change and cerebral volume loss. 4. Bilateral carotid atherosclerosis with suspected high-grade stenosis on the right. Further evaluation with carotid ultrasound is recommended. Electronically Signed   By: Davina Poke D.O.   On: 08/25/2021 17:51   CT Cervical Spine Wo Contrast  Result Date: 08/25/2021 CLINICAL DATA:  Head trauma, moderate-severe fall on thinners; Neck trauma (Age >= 65y) EXAM: CT HEAD WITHOUT CONTRAST CT CERVICAL SPINE WITHOUT CONTRAST TECHNIQUE: Multidetector CT imaging of the head and cervical spine was performed following the standard protocol without intravenous contrast. Multiplanar CT image reconstructions of the cervical spine were also generated. RADIATION DOSE REDUCTION: This exam was performed according to the departmental dose-optimization program which includes automated exposure control, adjustment of the mA and/or kV according to patient size and/or use of iterative reconstruction technique. COMPARISON:  11/30/2020, 06/19/2021 FINDINGS: CT HEAD FINDINGS Brain: No evidence of acute  infarction, hemorrhage, hydrocephalus, extra-axial collection or mass lesion/mass effect. Patchy low-density changes within the periventricular and subcortical white matter compatible with chronic microvascular ischemic change. Mild diffuse cerebral volume loss. Vascular: Atherosclerotic calcifications involving the large vessels of the skull base. No unexpected hyperdense vessel. Skull: Normal. Negative for fracture or focal lesion. Sinuses/Orbits: No acute finding. Other: Negative for scalp hematoma. CT CERVICAL SPINE FINDINGS Alignment: Facet joints are aligned without dislocation or traumatic listhesis. Dens and lateral masses are aligned. Skull base and vertebrae: No acute fracture. No primary bone lesion or focal pathologic process. Soft tissues and spinal canal: No prevertebral fluid or swelling. No visible canal hematoma. Disc levels: Degenerative disc disease is most pronounced at C5-6 and C6-7. Multilevel bilateral facet arthropathy is worse on the left. Upper chest: Partially visualized radiation changes within the included bilateral lung apices, similar in appearance to chest CT 06/19/2021. Other: Bilateral carotid atherosclerosis with suspected high-grade stenosis on the right (series 5, image 52). IMPRESSION: 1. No acute intracranial abnormality. 2. No acute cervical spine fracture or subluxation. 3. Chronic microvascular ischemic change and cerebral volume loss. 4. Bilateral carotid atherosclerosis with suspected high-grade stenosis on the right. Further evaluation with carotid ultrasound is recommended. Electronically Signed   By: Davina Poke D.O.   On: 08/25/2021 17:51   DG Chest 2 View  Result Date: 08/25/2021 CLINICAL DATA:  Shortness of breath EXAM: CHEST - 2 VIEW COMPARISON:  08/12/2021 FINDINGS: Left subclavian approach chest port remains in place. Stable heart size. Aortic atherosclerosis. Radiation changes within the bilateral upper  lobes. No evidence of a new airspace opacity. No  pleural effusion or pneumothorax. IMPRESSION: No active cardiopulmonary disease. Electronically Signed   By: Davina Poke D.O.   On: 08/25/2021 17:45     Time coordinating discharge: Over 30 minutes    Dwyane Dee, MD  [facility] 09/16/2021, 5:16 PM

## 2021-09-16 NOTE — Op Note (Signed)
[facility] Patient Name: John [name] Procedure Date : 09/16/2021 MRN: [id number] Attending MD: Gatha Mayer , MD Date of Birth: Jun 09, 1945 CSN: [id number] Age: 76 Admit Type: Inpatient Procedure:                Upper GI endoscopy Indications:              Recent gastrointestinal bleeding Providers:                Gatha Mayer, MD, Benay Pillow, RN, Gloris Ham, Technician Referring MD:              Medicines:                Monitored Anesthesia Care Complications:            No immediate complications. Estimated Blood Loss:     Estimated blood loss was minimal. Procedure:                Pre-Anesthesia Assessment:                           - Prior to the procedure, a History and Physical                            was performed, and patient medications and                            allergies were reviewed. The patient's tolerance of                            previous anesthesia was also reviewed. The risks                            and benefits of the procedure and the sedation                            options and risks were discussed with the patient.                            All questions were answered, and informed consent                            was obtained. Prior Anticoagulants: The patient                            last took Eliquis (apixaban) 3 days prior to the                            procedure. ASA Grade Assessment: IV - A patient                            with severe systemic disease that is a constant  threat to life. After reviewing the risks and                            benefits, the patient was deemed in satisfactory                            condition to undergo the procedure.                           After obtaining informed consent, the endoscope was                            passed under direct vision. Throughout the                            procedure, the  patient's blood pressure, pulse, and                            oxygen saturations were monitored continuously. The                            [id number]-[id number] ([id number]) Olympus endoscope was introduced                            through the mouth, and advanced to the second part                            of duodenum. The upper GI endoscopy was                            accomplished without difficulty. The patient                            tolerated the procedure well. Scope In: Scope Out: Findings:      Mucosal changes characterized by an increased vascular pattern were       found in the upper third of the esophagus.      Patchy mildly erythematous mucosa was found in the cardia, in the       gastric fundus and in the gastric body. Biopsies were taken with a cold       forceps for histology. Verification of patient identification for the       specimen was done. Estimated blood loss was minimal.      The Z-line was irregular.      The exam was otherwise without abnormality.      The cardia and gastric fundus were normal on retroflexion. Impression:               - Increased vascular pattern mucosa in the                            esophagus. This is c/w radiation effect (had in                            past for lung Ca)                           -  Erythematous mucosa in the cardia, gastric fundus                            and gastric body. Biopsied. Do not think cause of                            bleeding/blood loss                           - Z-line irregular.                           - The examination was otherwise normal. Recommendation:           - See the other procedure note for documentation of                            additional recommendations. 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 All rights reserved. The codes documented in this report are preliminary and upon coder review may  be revised to meet current compliance requirements. Gatha Mayer, MD 09/16/2021 11:27:50 AM This report has been signed electronically. Number of Addenda: 0

## 2021-09-16 NOTE — Op Note (Signed)
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 Estimated Blood Loss:     Estimated blood loss was minimal. Procedure:                Pre-Anesthesia Assessment:                           - Prior to the procedure, a History and Physical                            was performed, and patient medications and                            allergies were reviewed. The patient's tolerance of                            previous anesthesia was also reviewed. The risks                            and benefits of the procedure and the sedation                            options and risks were discussed with the patient.                            All questions were answered, and informed consent                            was obtained. Prior Anticoagulants: The patient                            last took Eliquis (apixaban) 3 days prior to the                            procedure. ASA Grade Assessment: IV - A patient                            with severe systemic disease that is a constant  threat to life. After reviewing the risks and                            benefits, the patient was deemed in satisfactory                            condition to undergo the procedure.                           After obtaining informed consent, the colonoscope                            was passed under direct vision. Throughout the                             procedure, the patient's blood pressure, pulse, and                            oxygen saturations were monitored continuously. The                            CF-HQ190L ([id number]) Olympus coloscope was                            introduced through the anus and advanced to the the                            terminal ileum, with identification of the                            appendiceal orifice and IC valve. The colonoscopy                            was performed without difficulty. The patient                            tolerated the procedure well. The quality of the                            bowel preparation was good. The bowel preparation                            used was MoviPrep via split dose instruction. The                            terminal ileum, ileocecal valve, appendiceal                            orifice, and rectum were photographed. Scope In: 11:00:31 AM Scope Out: 11:17:55 AM Scope Withdrawal Time: 0 hours 14 minutes 38 seconds  Total Procedure Duration: 0 hours 17 minutes 24 seconds  Findings:      The perianal and digital rectal examinations were normal.      A single small angiodysplastic lesion without bleeding was found  in the       cecum. Coagulation for bleeding prevention using argon plasma at 0.5       liters/minute and 20 watts was successful. To prevent bleeding       post-intervention, three hemostatic clips were successfully placed.      Scattered diverticula were found in the sigmoid colon, descending colon       and transverse colon.      The exam was otherwise without abnormality on direct and retroflexion       views. Impression:               - A single non-bleeding colonic angiodysplastic                            lesion. Treated with argon plasma coagulation                            (APC). Clips were placed to reduce risk of                            post-procedure bleeding given need for Eliquis                           - Diverticulosis  in the sigmoid colon, in the                            descending colon and in the transverse colon.                           - The examination was otherwise normal on direct                            and retroflexion views.                           - No specimens collected. Recommendation:           - Return patient to hospital ward for ongoing care.                           - Resume diet                           Resume Eliquis tomorrow                           GI signing off                           No need for GI outpatient follow-up but I will                            notify about pathology findings from EGD and any                            other treatment needed Procedure Code(s):        --- Professional ---  45382, Colonoscopy, flexible; with control of                            bleeding, any method Diagnosis Code(s):        --- Professional ---                           K55.20, Angiodysplasia of colon without hemorrhage                           K57.30, Diverticulosis of large intestine without                            perforation or abscess without bleeding CPT copyright 2019 [employer]. All rights reserved. The codes documented in this report are preliminary and upon coder review may  be revised to meet current compliance requirements. Gatha Mayer, MD 09/16/2021 11:34:59 AM This report has been signed electronically. Number of Addenda: 0

## 2021-09-17 ENCOUNTER — Encounter: Payer: Self-pay | Admitting: Internal Medicine

## 2021-09-17 ENCOUNTER — Telehealth: Payer: Self-pay | Admitting: Family

## 2021-09-17 ENCOUNTER — Ambulatory Visit: Payer: Medicare Other | Admitting: Primary Care

## 2021-09-17 ENCOUNTER — Telehealth: Payer: Self-pay

## 2021-09-17 ENCOUNTER — Encounter: Payer: Self-pay | Admitting: Pulmonary Disease

## 2021-09-17 ENCOUNTER — Ambulatory Visit (INDEPENDENT_AMBULATORY_CARE_PROVIDER_SITE_OTHER): Payer: Medicare Other | Admitting: Pulmonary Disease

## 2021-09-17 VITALS — BP 144/90 | HR 86 | Temp 97.7°F | Ht 66.0 in | Wt 167.6 lb

## 2021-09-17 DIAGNOSIS — J441 Chronic obstructive pulmonary disease with (acute) exacerbation: Secondary | ICD-10-CM

## 2021-09-17 LAB — SURGICAL PATHOLOGY

## 2021-09-17 MED ORDER — LEVALBUTEROL TARTRATE 45 MCG/ACT IN AERO: 2.0000 | INHALATION_SPRAY | Freq: Four times a day (QID) | RESPIRATORY_TRACT | 12 refills | Status: DC | PRN

## 2021-09-17 MED ORDER — PREDNISONE 10 MG PO TABS: ORAL_TABLET | ORAL | 0 refills | Status: AC

## 2021-09-17 MED ORDER — AZITHROMYCIN 250 MG PO TABS: ORAL_TABLET | ORAL | 0 refills | Status: AC

## 2021-09-17 MED ORDER — LEVALBUTEROL HCL 0.63 MG/3ML IN NEBU: 0.6300 mg | INHALATION_SOLUTION | Freq: Four times a day (QID) | RESPIRATORY_TRACT | 12 refills | Status: DC | PRN

## 2021-09-17 NOTE — Patient Instructions (Signed)
Zithromax 250 mg pill >> 2 pills on day 1, then 1 pill daily for next 4 days  Prednisone 10 mg pill >> 3 pills daily for 2 days, 2 pills daily for 2 days, 1 pill daily for 2 days  Xopenex every 6 hours as needed for cough, wheeze, chest congestion or shortness of breath; you can use this by an inhaler or in your nebulizer  Goal oxygen level should be greater than 90%  Follow up with Dr. Melvyn Novas in [place] office in 1 to 2 weeks

## 2021-09-17 NOTE — Telephone Encounter (Signed)
Pt called and hosp f/u was made with John [name] on Thursday this week  He is also seeing the specialist today

## 2021-09-17 NOTE — Telephone Encounter (Signed)
Transition Care Management Unsuccessful Follow-up Telephone Call  Date of discharge and from where:  09/16/21 - [facility] - acute on chronic CHF  Attempts:  1st Attempt  Reason for unsuccessful TCM follow-up call:  Left voice message  Patient already has appt 6/15@ 2:35 with [name] - I need to do his TCM call and go over meds.

## 2021-09-17 NOTE — Progress Notes (Signed)
Chief Complaint  Patient presents with   Acute Visit    Acute visit. Trouble breathing was supposed to see Dr. Melvyn Novas has had to increase O2 lately. States he was discharged from hospital yesterday.     Summary: 76 yo male smoker with COPD, Stage 3a NSCLC followed by Dr. Melvyn Novas.  Subjective: He is here for an acute evaluation.    He had EGD and colonoscopy yesterday for symptomatic anemia.  He has been getting more cough and wheeze.  Bringing up clear to yellow sputum.  No hemoptysis.  He has felt feverish, but didn't check his temperature.  Had to increase O2 from 2 to 3 liters.  Getting winded more easily with exertion.  Not having leg swelling.  Waking up with a cough.  He got burning and redness in his mouth after he used his albuterol HFA and nebulizer.  He thinks he had a reaction to the sulfate component of albuterol   Past medical history: Anxiety, Arthritis, BPH, Diastolic CHF, Atrial flutter, Depression, DM type 2, Nephrolithiasis, HLD, HTN, Hypothyroidism, Macular degeneration, Neuropathy, PAD   Vital signs: BP (!) 144/90 (BP Location: Left Arm, Patient Position: Sitting)   Pulse 86   Temp 97.7 F (36.5 C) (Temporal)   Ht 5' 6"  (1.676 m)   Wt 167 lb 9.6 oz (76 kg)   SpO2 91% Comment: 3LO2 cont  BMI 27.05 kg/m   Physical exam:  General - alert, wearing oxygen ENT - no sinus tenderness, no stridor Cardiac - regular rate/rhythm, no murmur Chest - b/l expiratory wheezing with rhonchi Extremities - no cyanosis, clubbing, or edema Skin - no rashes Psych - normal mood and behavior   Studies: PSG 02/26/18 >> AHI 5.9, SpO2 low 82% CT chest 06/20/21 >> moderate emphysema, bronchial thickening, XRT changes upper lobes Echo 09/15/21 >> EF 55 to 60%, mild elevation in PASP, mild LA dilation   Labs:    Latest Ref Rng & Units 09/16/2021    8:33 AM 09/15/2021    2:52 AM 09/14/2021    7:06 AM  CMP  Glucose 70 - 99 mg/dL 179  231  163   BUN 8 - 23 mg/dL 17  24  27     Creatinine 0.61 - 1.24 mg/dL 0.92  1.11  1.16   Sodium 135 - 145 mmol/L 142  137  139   Potassium 3.5 - 5.1 mmol/L 3.2  3.9  4.0   Chloride 98 - 111 mmol/L 104  98  99   CO2 22 - 32 mmol/L 30  29  31    Calcium 8.9 - 10.3 mg/dL 8.7  8.7  8.6        Latest Ref Rng & Units 09/16/2021    8:33 AM 09/15/2021    2:52 AM 09/14/2021    9:06 PM  CBC  WBC 4.0 - 10.5 K/uL 14.4  5.8    Hemoglobin 13.0 - 17.0 g/dL 9.2  8.4  8.2   Hematocrit 39.0 - 52.0 % 31.2  27.8  28.7   Platelets 150 - 400 K/uL 285  235      BNP (last 3 results) Recent Labs    10/16/20 1417 08/25/21 1716 09/13/21 2150  BNP 228.9* 407.4* 267.8*    Assessment/plan:  COPD exacerbation. - will give him taper of prednisone and course of zithromax - he thinks he had allergic reaction to albuterol sulfate  - will prescribe prn xopenex HFA and nebulizer - continue trelegy 100 one puff daily  Chronic respiratory failure with  hypoxia. - adjust oxygen to keep SpO2 > 90% - his baseline O2 need is 2 liters, but he has been needing 3 liters over last couple of days  Chronic diastolic CHF, Atrial flutter. - remains on eliquis and torsemide - followed by Dr. Eleonore Chiquito with cardiology  NSCLC Stage IIIa (adenocarcinoma). - followed by Dr. Delton Coombes with oncology  Tobacco abuse. - emphasized importance of smoking cessation   Time spent: 45 minutes   Patient instructions: Patient Instructions  Zithromax 250 mg pill >> 2 pills on day 1, then 1 pill daily for next 4 days  Prednisone 10 mg pill >> 3 pills daily for 2 days, 2 pills daily for 2 days, 1 pill daily for 2 days  Xopenex every 6 hours as needed for cough, wheeze, chest congestion or shortness of breath; you can use this by an inhaler or in your nebulizer  Goal oxygen level should be greater than 90%  Follow up with Dr. Melvyn Novas in [employer] office in 1 to 2 weeks   Medication list: Allergies as of 09/17/2021       Reactions   Sulfa Antibiotics  Swelling   Mouth swelling   Atorvastatin Other (See Comments)   Muscle aches - tolerating Pravastatin 40 mg MWF   Jardiance [empagliflozin] Other (See Comments)   FEELS SLUGGISH, TIRED   Lopressor [metoprolol] Other (See Comments)   Fatigue   Rosuvastatin Other (See Comments)   Muscle aches - tolerating Pravastatin 40 mg MWF   Doxycycline Nausea Only   Temazepam Other (See Comments)   Made insomnia worse         Medication List        Accurate as of September 17, 2021 11:40 AM. If you have any questions, ask your nurse or doctor.          STOP taking these medications    albuterol (2.5 MG/3ML) 0.083% nebulizer solution Commonly known as: PROVENTIL Stopped by: Chesley Mires, MD   albuterol 108 (90 Base) MCG/ACT inhaler Commonly known as: VENTOLIN HFA Stopped by: Chesley Mires, MD       TAKE these medications    acetaminophen 650 MG CR tablet Commonly known as: TYLENOL Take 1,300 mg by mouth every 8 (eight) hours as needed for pain.   aspirin EC 81 MG tablet Take 81 mg by mouth daily. Swallow whole.   azithromycin 250 MG tablet Commonly known as: ZITHROMAX Take 2 tablets (500 mg total) by mouth daily for 1 day, THEN 1 tablet (250 mg total) daily for 4 days. Start taking on: September 17, 2021 Started by: Chesley Mires, MD   B-12 PO Take 1 tablet by mouth daily.   Eliquis 5 MG Tabs tablet Generic drug: apixaban TAKE 1 TABLET BY MOUTH TWICE A DAY What changed: how much to take   fluticasone 50 MCG/ACT nasal spray Commonly known as: FLONASE PLACE 1 SPRAY INTO BOTH NOSTRILS DAILY AS NEEDED FOR ALLERGIES OR RHINITIS.   gabapentin 300 MG capsule Commonly known as: NEURONTIN TAKE 1 CAPSULE BY MOUTH THREE TIMES A DAY What changed: See the new instructions.   Iron (Ferrous Sulfate) 325 (65 Fe) MG Tabs Take 325 mg by mouth 2 (two) times daily. What changed: when to take this   Klor-Con M20 20 MEQ tablet Generic drug: potassium chloride SA TAKE 1 TABLET BY MOUTH EVERY  DAY What changed: how much to take   levalbuterol 0.63 MG/3ML nebulizer solution Commonly known as: XOPENEX Take 3 mLs (0.63 mg total) by nebulization every  6 (six) hours as needed for wheezing or shortness of breath. Started by: Chesley Mires, MD   levalbuterol 45 MCG/ACT inhaler Commonly known as: Xopenex HFA Inhale 2 puffs into the lungs every 6 (six) hours as needed for wheezing or shortness of breath. Started by: Chesley Mires, MD   levothyroxine 50 MCG tablet Commonly known as: SYNTHROID TAKE 1 TABLET BY MOUTH EVERY DAY What changed: when to take this   linaclotide 72 MCG capsule Commonly known as: Linzess Take 1 capsule (72 mcg total) by mouth daily before breakfast. What changed:  when to take this reasons to take this   Melatonin 10 MG Tabs Take 10 mg by mouth at bedtime as needed (sleep).   nebivolol 2.5 MG tablet Commonly known as: BYSTOLIC TAKE 1 TABLET BY MOUTH EVERY DAY   NON FORMULARY CPAP at bedtime   ondansetron 4 MG tablet Commonly known as: Zofran Take 1 tablet (4 mg total) by mouth every 8 (eight) hours as needed for nausea or vomiting.   OneTouch Delica Lancets 25W Misc Use to test blood sugars daily as directed. DX: E11.9   OneTouch Verio Reflect w/Device Kit Use to test blood sugars daily as directed. DX: E11.9   OneTouch Verio test strip Generic drug: glucose blood USE TO TEST BLOOD SUGARS DAILY AS DIRECTED. DX: E11.9   OXYGEN Inhale 2 L into the lungs continuous.   pantoprazole 40 MG tablet Commonly known as: Protonix Take 1 tablet (40 mg total) by mouth daily.   pravastatin 40 MG tablet Commonly known as: PRAVACHOL TAKE 1 TABLET (40 MG TOTAL) BY MOUTH ON MONDAYS , WEDNESDAYS, AND FRIDAYS AS DIRECTED What changed: See the new instructions.   predniSONE 10 MG tablet Commonly known as: DELTASONE Take 3 tablets (30 mg total) by mouth daily with breakfast for 2 days, THEN 2 tablets (20 mg total) daily with breakfast for 2 days, THEN 1  tablet (10 mg total) daily with breakfast for 2 days. Start taking on: September 17, 2021 Started by: Chesley Mires, MD   Rybelsus 7 MG Tabs Generic drug: Semaglutide Take 7 mg by mouth daily.   torsemide 20 MG tablet Commonly known as: DEMADEX Take 2 tablets (40 mg) in the morning, take 2 tablets (40 mg) in the evening daily as needed. What changed:  how much to take how to take this when to take this additional instructions   traZODone 50 MG tablet Commonly known as: DESYREL Take 0.5 tablets (25 mg total) by mouth at bedtime.   Trelegy Ellipta 100-62.5-25 MCG/ACT Aepb Generic drug: Fluticasone-Umeclidin-Vilant Inhale 1 puff into the lungs daily.        Signature: Chesley Mires, MD [place] Pager - [phone] 09/17/2021, 12:49 PM

## 2021-09-17 NOTE — Telephone Encounter (Signed)
Patient was admitted to [facility] hospital on Friday 6/9 and was discharged on 6/12. Please call back to set up hospital follow up.

## 2021-09-18 ENCOUNTER — Encounter (HOSPITAL_COMMUNITY): Payer: Self-pay | Admitting: Internal Medicine

## 2021-09-18 ENCOUNTER — Telehealth: Payer: Self-pay | Admitting: Family

## 2021-09-18 DIAGNOSIS — C3491 Malignant neoplasm of unspecified part of right bronchus or lung: Secondary | ICD-10-CM | POA: Diagnosis not present

## 2021-09-18 DIAGNOSIS — I5033 Acute on chronic diastolic (congestive) heart failure: Secondary | ICD-10-CM | POA: Diagnosis not present

## 2021-09-18 DIAGNOSIS — J441 Chronic obstructive pulmonary disease with (acute) exacerbation: Secondary | ICD-10-CM | POA: Diagnosis not present

## 2021-09-18 DIAGNOSIS — I110 Hypertensive heart disease with heart failure: Secondary | ICD-10-CM | POA: Diagnosis not present

## 2021-09-18 DIAGNOSIS — J9611 Chronic respiratory failure with hypoxia: Secondary | ICD-10-CM | POA: Diagnosis not present

## 2021-09-18 DIAGNOSIS — E1165 Type 2 diabetes mellitus with hyperglycemia: Secondary | ICD-10-CM | POA: Diagnosis not present

## 2021-09-18 NOTE — Telephone Encounter (Signed)
Transition Care Management Follow-up Telephone Call Date of discharge and from where: 09/16/21 - [facility] - acute on chronic CHF How have you been since you were released from the hospital? [place], just weak and tired Any questions or concerns? Yes - He would like a portable oxygen concentrator and needs this sent to [facility] - he has 2 big oxygen tanks, but is too weak to pull them around  Items Reviewed: Did the pt receive and understand the discharge instructions provided? Yes  Medications obtained and verified? Yes  Other? No  Any new allergies since your discharge? No  Dietary orders reviewed? Yes Do you have support at home? Yes   Home Care and Equipment/Supplies: Were home health services ordered? yes If so, what is the name of the agency? [facility]  Has the agency set up a time to come to the patient's home? yes Were any new equipment or medical supplies ordered?  Yes: Oxygen What is the name of the medical supply agency? [facility] Were you able to get the supplies/equipment? yes Do you have any questions related to the use of the equipment or supplies? No  Functional Questionnaire: (I = Independent and D = Dependent) ADLs: I  Bathing/Dressing- I  Meal Prep- I  Eating- I  Maintaining continence- I  Transferring/Ambulation- I  Managing Meds- I  Follow up appointments reviewed:  PCP Hospital f/u appt confirmed? Yes  Scheduled to see Christy on 09/19/21 @ 2:35. [facility] f/u appt confirmed? Yes  Scheduled to see Wert on 6/21 @ 4. Are transportation arrangements needed? No  If their condition worsens, is the pt aware to call PCP or go to the Emergency Dept.? Yes Was the patient provided with contact information for the PCP's office or ED? Yes Was to pt encouraged to call back with questions or concerns? Yes

## 2021-09-19 ENCOUNTER — Ambulatory Visit: Payer: Medicare Other

## 2021-09-19 ENCOUNTER — Encounter: Payer: Self-pay | Admitting: Family

## 2021-09-19 ENCOUNTER — Ambulatory Visit (INDEPENDENT_AMBULATORY_CARE_PROVIDER_SITE_OTHER): Payer: Medicare Other | Admitting: Family

## 2021-09-19 VITALS — BP 123/64 | HR 87 | Temp 97.2°F | Ht 66.0 in | Wt 163.0 lb

## 2021-09-19 DIAGNOSIS — K922 Gastrointestinal hemorrhage, unspecified: Secondary | ICD-10-CM

## 2021-09-19 DIAGNOSIS — J9611 Chronic respiratory failure with hypoxia: Secondary | ICD-10-CM

## 2021-09-19 DIAGNOSIS — C3491 Malignant neoplasm of unspecified part of right bronchus or lung: Secondary | ICD-10-CM

## 2021-09-19 DIAGNOSIS — I5033 Acute on chronic diastolic (congestive) heart failure: Secondary | ICD-10-CM | POA: Diagnosis not present

## 2021-09-19 DIAGNOSIS — J449 Chronic obstructive pulmonary disease, unspecified: Secondary | ICD-10-CM | POA: Diagnosis not present

## 2021-09-19 NOTE — Patient Instructions (Signed)
Gastrointestinal Bleeding Gastrointestinal (GI) bleeding is bleeding somewhere along the digestive tract, between the mouth and the anus. The digestive tract includes the mouth, esophagus, stomach, small intestine, large intestine, and anus. The large intestine is often called the colon. GI bleeding can be caused by various problems. The severity of these problems can be mild, serious, or life-threatening. If you have GI bleeding, you may find blood in your stools (feces), you may have black stools, or you may vomit blood. You may need to stay in the hospital if there is a lot of bleeding. What are the causes? This condition may be caused by: Inflammation, irritation, or swelling of the esophagus (esophagitis). The esophagus is part of the body that moves food from your mouth to your stomach. Swollen veins in the rectum (hemorrhoids). Tears in the anus (anal fissures). The tears are often caused by passing hard stool. Pouches that form on the colon and may bleed (diverticulosis). Inflammation in areas with diverticulosis. This is called diverticulitis.This can cause pain, fever, and bloody stools. Growths (polyps) or cancer. Colon cancer often starts out as precancerous polyps. Gastritis and ulcers. These may cause bleeding in the upper GI tract, near the stomach. What increases the risk? You are more likely to develop this condition if: You have an infection in your stomach from a type of bacteria called Helicobacter pylori. You take certain medicines, such as: NSAIDs. Aspirin. Selective serotonin reuptake inhibitors (SSRIs). Steroids. Antiplatelet or anticoagulant medicines. You smoke. You drink alcohol. What are the signs or symptoms? Common symptoms of this condition include: Bright red blood in your vomit, or vomit that looks like coffee grounds. Bloody, black, or tarry stools. Bleeding from the lower GI tract will usually cause red or maroon blood in the stools. Bleeding from the  upper GI tract may cause black, tarry stools that are often stronger smelling than usual. In certain cases, if the bleeding is fast enough, the stools may be red. Pain or cramping in the abdomen. How is this diagnosed? This condition may be diagnosed based on: Your medical history and a physical exam. Various tests, such as: Blood tests. Stool tests. X-rays and other imaging tests. Esophagogastroduodenoscopy (EGD). In this test, a flexible, lighted tube is used to look at your esophagus, stomach, and small intestine. Colonoscopy. In this test, a flexible, lighted tube is used to look at your colon. How is this treated? Treatment for this condition depends on the cause of the bleeding. For example: For bleeding from the esophagus, stomach, small intestine, or colon, the health care provider may do a procedure to stop bleeding during your EGD or colonoscopy. Inflammation or infection of the colon can be treated with medicines. Certain rectal problems can be treated with creams, suppositories, or warm baths. Medicines may be given to reduce acid in your stomach. Surgery is sometimes done. Blood transfusions are sometimes needed if a lot of blood has been lost. If there is a lot of bleeding, you will need to stay in the hospital for observation. If bleeding is mild, you may be allowed to go home. Follow these instructions at home:  Take over-the-counter and prescription medicines only as told by your health care provider. Eat foods that are high in fiber, such as beans, whole grains, and fresh fruits and vegetables. This will help to keep your stools soft. Eating 1-3 prunes each day works well for many people. Drink enough fluid to keep your urine pale yellow. Keep all follow-up visits. This is important. Contact a  health care provider if: Your symptoms do not improve with treatment. Get help right away if: Your bleeding does not stop. You feel light-headed or you faint. You feel  weak. You have severe cramps in your back or abdomen. You pass large blood clots in your stool. Your symptoms are getting worse. You have chest pain or fast heartbeats. These symptoms may be an emergency. Get help right away. Call 911. Do not wait to see if the symptoms will go away. Do not drive yourself to the hospital. Summary Gastrointestinal (GI) bleeding is bleeding somewhere along the digestive tract, between the mouth and anus. GI bleeding can be caused by various problems. Treatment for this condition depends on the cause of the bleeding. Take over-the-counter and prescription medicines only as told by your health care provider. Get help right away if your bleeding increases, your symptoms are getting worse, or you have new symptoms. Keep all follow-up visits. This is important. This information is not intended to replace advice given to you by your health care provider. Make sure you discuss any questions you have with your health care provider. Document Revised: 10/26/2020 Document Reviewed: 10/26/2020 Elsevier Patient Education  [place].

## 2021-09-19 NOTE — Telephone Encounter (Signed)
Discussed at visit.   Evelina Dun, FNP

## 2021-09-19 NOTE — Progress Notes (Signed)
Subjective:    Patient ID: John [name], male    DOB: 1945-07-10, 76 y.o.   MRN: [id number]  Chief Complaint  Patient presents with   Hospitalization Follow-up    CHF   Today's visit was for Transitional Care Management.  The patient was discharged from [facility] on 09/16/21 with a primary diagnosis of CHF.   Contact with the patient and/or caregiver, by a clinical staff member, was made on 09/17/21 and was documented as a telephone encounter within the EMR.  Through chart review and discussion with the patient I have determined that management of their condition is of high complexity.    Pt went to the ED with GI bleed and anemia. He had an EGD and colonoscopy and found a small angiodysplastic lesion that was treated with APC.   He has CHF and was given IV lasix. States his breathing is stable.   He has COPD and NS lung cancer and is on continuous O2 of 2L. He is having a hard time using his oxygen while away from the his home because he does not currently have a portable concentrator.  Anemia Presents for follow-up visit. Symptoms include bruises/bleeds easily and malaise/fatigue. There has been no confusion.  Congestive Heart Failure Presents for follow-up visit. Associated symptoms include edema and fatigue. The symptoms have been stable.      Review of Systems  Constitutional:  Positive for fatigue and malaise/fatigue.  Hematological:  Bruises/bleeds easily.  Psychiatric/Behavioral:  Negative for confusion.   All other systems reviewed and are negative.  Family History  Problem Relation Age of Onset   Hypertension Mother    Diabetes Father    Heart disease Father    Stroke Father    Hypertension Sister    Social History   Socioeconomic History   Marital status: Soil scientist    Spouse name: Not on file   Number of children: 5   Years of education: 10   Highest education level: 10th grade  Occupational History   Occupation: Retired  Tobacco Use    Smoking status: Every Day    Packs/day: 0.50    Years: 55.00    Total pack years: 27.50    Types: Cigarettes    Start date: 03/11/1961   Smokeless tobacco: Never   Tobacco comments:    1/2 pack to 1 pack per day 12/14/19  Vaping Use   Vaping Use: Never used  Substance and Sexual Activity   Alcohol use: No   Drug use: No   Sexual activity: Yes    Birth control/protection: None  Other Topics Concern   Not on file  Social History Narrative   John [name] is his POA and lives with him. Children out of town - one in [place] [place], others in [place].   Social Determinants of Health   Financial Resource Strain: Low Risk  (09/18/2020)   Overall Financial Resource Strain (CARDIA)    Difficulty of Paying Living Expenses: Not hard at all  Food Insecurity: No Food Insecurity (09/18/2020)   Hunger Vital Sign    Worried About Running Out of Food in the Last Year: Never true    Ran Out of Food in the Last Year: Never true  Transportation Needs: No Transportation Needs (09/18/2020)   PRAPARE - Hydrologist (Medical): No    Lack of Transportation (Non-Medical): No  Physical Activity: Unknown (09/18/2020)   Exercise Vital Sign    Days of Exercise per Week: 7  days    Minutes of Exercise per Session: Not on file  Stress: No Stress Concern Present (09/18/2020)   Deport    Feeling of Stress : Not at all  Social Connections: Moderately Isolated (09/18/2020)   Social Connection and Isolation Panel [NHANES]    Frequency of Communication with Friends and Family: More than three times a week    Frequency of Social Gatherings with Friends and Family: Once a week    Attends Religious Services: Never    Marine scientist or Organizations: No    Attends Archivist Meetings: Never    Marital Status: Living with partner        Objective:   Physical Exam Vitals reviewed.  Constitutional:       General: He is not in acute distress.    Appearance: He is well-developed.  HENT:     Head: Normocephalic.  Eyes:     General:        Right eye: No discharge.        Left eye: No discharge.     Pupils: Pupils are equal, round, and reactive to light.  Neck:     Thyroid: No thyromegaly.  Cardiovascular:     Rate and Rhythm: Normal rate and regular rhythm.     Heart sounds: Murmur heard.  Pulmonary:     Effort: Pulmonary effort is normal. No respiratory distress.     Breath sounds: Wheezing and rhonchi present.  Abdominal:     General: Bowel sounds are normal. There is no distension.     Palpations: Abdomen is soft.     Tenderness: There is no abdominal tenderness.  Musculoskeletal:        General: No tenderness. Normal range of motion.     Cervical back: Normal range of motion and neck supple.     Right lower leg: Edema (3+) present.     Left lower leg: Edema (3+) present.  Skin:    General: Skin is warm and dry.     Findings: No erythema or rash.  Neurological:     Mental Status: He is alert and oriented to person, place, and time.     Cranial Nerves: No cranial nerve deficit.     Motor: Weakness (generalized weakness, using a cane to walk) present.     Deep Tendon Reflexes: Reflexes are normal and symmetric.  Psychiatric:        Behavior: Behavior normal.        Thought Content: Thought content normal.        Judgment: Judgment normal.       BP 123/64   Pulse 87   Temp (!) 97.2 F (36.2 C)   Ht 5' 6"  (1.676 m)   Wt 163 lb (73.9 kg)   SpO2 99%   BMI 26.31 kg/m      Assessment & Plan:  John [name] comes in today with chief complaint of Hospitalization Follow-up (CHF)   Diagnosis and orders addressed:  1. Non-small cell lung cancer, right ([place]) - For home use only DME oxygen - CMP14+EGFR - CBC with Differential/Platelet  2. COPD GOLD III/ group D still smoking  - For home use only DME oxygen - CMP14+EGFR - CBC with Differential/Platelet  3.  Chronic respiratory failure with hypoxia (H[facility]) - For home use only DME oxygen - CMP14+EGFR - CBC with Differential/Platelet  4. Gastrointestinal hemorrhage, unspecified gastrointestinal hemorrhage type - CMP14+EGFR - CBC with Differential/Platelet  5. Acute on chronic diastolic CHF (congestive heart failure) ([place]) - CMP14+EGFR - CBC with Differential/Platelet   Labs pending Health Maintenance reviewed Diet and exercise encouraged  Follow up plan: 1 month   Evelina Dun, FNP

## 2021-09-20 LAB — CBC WITH DIFFERENTIAL/PLATELET
Basophils Absolute: 0 10*3/uL (ref 0.0–0.2)
Basos: 0 %
EOS (ABSOLUTE): 0 10*3/uL (ref 0.0–0.4)
Eos: 0 %
Hematocrit: 29.4 % — ABNORMAL LOW (ref 37.5–51.0)
Hemoglobin: 9 g/dL — ABNORMAL LOW (ref 13.0–17.7)
Immature Grans (Abs): 0.1 10*3/uL (ref 0.0–0.1)
Immature Granulocytes: 1 %
Lymphocytes Absolute: 0.2 10*3/uL — ABNORMAL LOW (ref 0.7–3.1)
Lymphs: 2 %
MCH: 33.1 pg — ABNORMAL HIGH (ref 26.6–33.0)
MCHC: 30.6 g/dL — ABNORMAL LOW (ref 31.5–35.7)
MCV: 108 fL — ABNORMAL HIGH (ref 79–97)
Monocytes Absolute: 0.3 10*3/uL (ref 0.1–0.9)
Monocytes: 3 %
NRBC: 1 % — ABNORMAL HIGH (ref 0–0)
Neutrophils Absolute: 9.7 10*3/uL — ABNORMAL HIGH (ref 1.4–7.0)
Neutrophils: 94 %
Platelets: 338 10*3/uL (ref 150–450)
RBC: 2.72 x10E6/uL — CL (ref 4.14–5.80)
RDW: 15.2 % (ref 11.6–15.4)
WBC: 10.3 10*3/uL (ref 3.4–10.8)

## 2021-09-20 LAB — CMP14+EGFR
ALT: 17 IU/L (ref 0–44)
AST: 14 IU/L (ref 0–40)
Albumin/Globulin Ratio: 1.9 (ref 1.2–2.2)
Albumin: 3.8 g/dL (ref 3.7–4.7)
Alkaline Phosphatase: 115 IU/L (ref 44–121)
BUN/Creatinine Ratio: 29 — ABNORMAL HIGH (ref 10–24)
BUN: 29 mg/dL — ABNORMAL HIGH (ref 8–27)
Bilirubin Total: 0.4 mg/dL (ref 0.0–1.2)
CO2: 28 mmol/L (ref 20–29)
Calcium: 8.9 mg/dL (ref 8.6–10.2)
Chloride: 96 mmol/L (ref 96–106)
Creatinine, Ser: 1.01 mg/dL (ref 0.76–1.27)
Globulin, Total: 2 g/dL (ref 1.5–4.5)
Glucose: 363 mg/dL — ABNORMAL HIGH (ref 70–99)
Potassium: 4.8 mmol/L (ref 3.5–5.2)
Sodium: 141 mmol/L (ref 134–144)
Total Protein: 5.8 g/dL — ABNORMAL LOW (ref 6.0–8.5)
eGFR: 78 mL/min/{1.73_m2} (ref >59)

## 2021-09-23 ENCOUNTER — Telehealth: Payer: Self-pay | Admitting: Family

## 2021-09-23 DIAGNOSIS — I110 Hypertensive heart disease with heart failure: Secondary | ICD-10-CM | POA: Diagnosis not present

## 2021-09-23 DIAGNOSIS — C3491 Malignant neoplasm of unspecified part of right bronchus or lung: Secondary | ICD-10-CM | POA: Diagnosis not present

## 2021-09-23 DIAGNOSIS — J441 Chronic obstructive pulmonary disease with (acute) exacerbation: Secondary | ICD-10-CM | POA: Diagnosis not present

## 2021-09-23 DIAGNOSIS — I5033 Acute on chronic diastolic (congestive) heart failure: Secondary | ICD-10-CM | POA: Diagnosis not present

## 2021-09-23 DIAGNOSIS — J9611 Chronic respiratory failure with hypoxia: Secondary | ICD-10-CM | POA: Diagnosis not present

## 2021-09-23 DIAGNOSIS — E1165 Type 2 diabetes mellitus with hyperglycemia: Secondary | ICD-10-CM | POA: Diagnosis not present

## 2021-09-23 NOTE — Telephone Encounter (Signed)
LM to [facility]  

## 2021-09-24 DIAGNOSIS — E1165 Type 2 diabetes mellitus with hyperglycemia: Secondary | ICD-10-CM | POA: Diagnosis not present

## 2021-09-24 DIAGNOSIS — C3491 Malignant neoplasm of unspecified part of right bronchus or lung: Secondary | ICD-10-CM | POA: Diagnosis not present

## 2021-09-24 DIAGNOSIS — I5033 Acute on chronic diastolic (congestive) heart failure: Secondary | ICD-10-CM | POA: Diagnosis not present

## 2021-09-24 DIAGNOSIS — J9611 Chronic respiratory failure with hypoxia: Secondary | ICD-10-CM | POA: Diagnosis not present

## 2021-09-24 DIAGNOSIS — J441 Chronic obstructive pulmonary disease with (acute) exacerbation: Secondary | ICD-10-CM | POA: Diagnosis not present

## 2021-09-24 DIAGNOSIS — I110 Hypertensive heart disease with heart failure: Secondary | ICD-10-CM | POA: Diagnosis not present

## 2021-09-24 NOTE — Telephone Encounter (Signed)
Pt aware of the below, stated that [facility] first told him they did not have the order and that he would not qualify, this is why he called [facility] and that they were faxing over ppw. Will try to get in contact w/ Estill Bamberg again.

## 2021-09-24 NOTE — Telephone Encounter (Signed)
TC to [facility], they did receive my fax on 09/19/21. They tried to call pt last Friday to sched an eval appt but was not able to reach him. TC to [facility], Estill Bamberg was out of the office. Spoke w/ Karna Christmas, she was not able to pull pt up in her system, pt may have just been asking how to change company's. LMOVM to pt to call me back, will let him know the above information from [facility] and will give him their ph# [phone] to call and set up a time for them to come out.

## 2021-09-25 ENCOUNTER — Encounter: Payer: Self-pay | Admitting: Internal Medicine

## 2021-09-25 ENCOUNTER — Other Ambulatory Visit: Payer: Self-pay | Admitting: Nurse Practitioner

## 2021-09-25 ENCOUNTER — Ambulatory Visit (INDEPENDENT_AMBULATORY_CARE_PROVIDER_SITE_OTHER): Payer: Medicare Other | Admitting: Internal Medicine

## 2021-09-25 DIAGNOSIS — C3491 Malignant neoplasm of unspecified part of right bronchus or lung: Secondary | ICD-10-CM

## 2021-09-25 DIAGNOSIS — J449 Chronic obstructive pulmonary disease, unspecified: Secondary | ICD-10-CM | POA: Diagnosis not present

## 2021-09-25 DIAGNOSIS — F17210 Nicotine dependence, cigarettes, uncomplicated: Secondary | ICD-10-CM | POA: Diagnosis not present

## 2021-09-25 MED ORDER — PREDNISONE 10 MG PO TABS: ORAL_TABLET | ORAL | 0 refills | Status: DC

## 2021-09-25 MED ORDER — AZITHROMYCIN 250 MG PO TABS: ORAL_TABLET | ORAL | 0 refills | Status: DC

## 2021-09-25 MED ORDER — FAMOTIDINE 20 MG PO TABS: ORAL_TABLET | ORAL | 11 refills | Status: DC

## 2021-09-25 NOTE — Patient Instructions (Addendum)
Plan A = Automatic = Always=   Continue  Trelegy 100 1st thing in am   Zpak/Prednisone 10 mg take  4 each am x 2 days,   2 each am x 2 days,  1 each am x 2 days and stop   Change Pantoprazole (protonix) 40 mg  to where you  Take  30-60 min before first meal of the day and Pepcid (famotidine)  20 mg after supper until return to office     Plan B = Backup (to supplement plan A, not to replace it) Only use your levobuterol inhaler as a rescue medication to be used if you can't catch your breath by resting or doing a relaxed purse lip breathing pattern.  - The less you use it, the better it will work when you need it. - Ok to use the inhaler up to 2 puffs  every 4 hours if you must but call for appointment if use goes up over your usual need - Don't leave home without it !!  (think of it like the spare tire for your car)    Make sure you check your oxygen saturation at your highest level of activity to be sure it stays over 90% and keep track of it at least once a week, more often if breathing getting worse, and let me know if losing ground.    The key is to stop smoking completely before smoking completely stops you!    Please schedule a follow up office visit in 2-3 weeks, call sooner if needed with all medications /inhalers/ solutions in hand so we can verify exactly what you are taking. This includes all medications from all doctors and over the [place] separate them into two bags:  the ones you take automatically, no matter what, vs the ones you take just when you feel you need them "BAG #2 is UP TO YOU"  - this will really help [place] help you take your medications more effectively.

## 2021-09-25 NOTE — Telephone Encounter (Signed)
Patient calling to get lab results from 09/19/21.  Please review and advise.

## 2021-09-25 NOTE — Telephone Encounter (Signed)
PCP on vacation. Labs from 09/19/2021  Elevated glucose: patient is a diabetic but am not sure if labs where done when patient was fasting. He is already on blood glucose medication. Continue checking Blood glucose at home and follow up if not well controlled.  Protein decreased; advised to eat a well balanced diet  Hemoglobin decreased (anemia) , looks like patient is already on iron supplement and HGB values have improved from 2 weeks ago.

## 2021-09-25 NOTE — Telephone Encounter (Signed)
Pt calling again about results.

## 2021-09-25 NOTE — Telephone Encounter (Signed)
Ashly w/ [employer] came to office today w/ forms to be filled out on patient. A new O2 order is needing to be placed and pt needs O2 walk testing. Pt has appt on 09/26/21 with Dr. Warrick Parisian, will take this ppw to providers nurse.

## 2021-09-25 NOTE — Progress Notes (Signed)
John [name], male    DOB: 10/17/1945,    MRN: [id number]   Brief patient profile:  71 yowm active smoker/MM with onset of sob around 2015 dx as copd at [facility] with GOLD III criteria then dx  RUL lung ca [name] nav bx then  rx chemo/RT summary rx     Non-small cell lung cancer, right ([facility]) 1.  Stage IIIa (T1b N2 cM0) NSCLC, favoring adenocarcinoma: -Chemo XRT with weekly carboplatin and paclitaxel completed on 08/20/2016. -Consolidation durvalumab from 09/26/2016 through 09/25/2017.     History of Present Illness  10/24/2019  Pulmonary/ 1st office eval/Jode Lippe on breo 200/ incruse and lisinopril  Chief Complaint  Patient presents with   Consult    Patient has shortness of breath with exertion. When resting he is fine. Productive cough with clear sputum. Had lung cancer and has COPD.   Dyspnea:  Walking x mailbox = 25 ft slt incline  Cough: am mucoid clear  Sleep: flat bed/ 2 pillows/ cpap  SABA use:  q 6h prn  rec Stop lisinopril substitute  benicar 40 mg daily in place of lisinopril  - if blood pressure too low then reduce or stop the amlodipine as it is likely contributing to your fluid in your legs Stop breo and incruse  and substitute Trelogy one click am two good drags hold at least 5 seconds and out thru the nose Only use your albuterol as a rescue medication  The key is to stop smoking completely before smoking completely stops you!   12/14/2019  f/u ov/[place] office/Bernell Sigal re: GOLD III/ still smoking  Chief Complaint  Patient presents with   Follow-up    shortness of breath with exertion  Dyspnea:  Slowed down by R leg claudication  Cough: am congestion few minutes  Sleeping: flat bed 2 pillows and CPAP per [place] SABA use:  Sometimes at hs  02: none  Rec No change rx   11/08/2020 post hosp  f/u ov/Swara Donze re:  GOLD III / still smoking  Maint trelegy  Chief Complaint  Patient presents with   Follow-up    F/U coughing and SOB. Inhalers working  well.  Dyspnea:  slowed by R hip / mailbox and slt uphill 50 ft does ok  Cough: some am congestion > clear  mcus  Sleeping: flat bed / cpap / 02 2lpm no resp cc  SABA use: not much  02: not using  Covid status:   vax x 3  Rec No change rx    [facility]ood eval aecopd  09/17/21 Zithromax 250 mg pill >> 2 pills on day 1, then 1 pill daily for next 4 days  Prednisone 10 mg pill >> 3 pills daily for 2 days, 2 pills daily for 2 days, 1 pill daily for 2 days  Xopenex every 6 hours as needed for cough, wheeze, chest congestion or shortness of breath; you can use this by an inhaler or in your nebulizer  Goal oxygen level should be greater than 90%  Follow up with Dr. Melvyn Novas in [place] office in 1 to 2 weeks    09/25/2021  f/u ov/[place] office/Copeland Lapier re: GOLD 3 /Group E maint on trelegy/ still smoking   Chief Complaint  Patient presents with   Follow-up    Feeling better since visit on 6/13 has finished prednisone and z pak. Still hurting in chest when showering would like another z pak   Dyspnea:  worse in shower / limited by hips/ when walks with lowerst  RA sats 93% per pt Cough: clear mucus Sleeping: on cpap and 02 2lpm on 2 pillows / bed is flat SABA use: last used 6 h  02: only hs with cpap      No obvious day to day or daytime variability or assoc excess/ purulent sputum or mucus plugs or hemoptysis or cp or chest tightness, subjective wheeze or overt sinus or hb symptoms.   sleeping without nocturnal  or early am exacerbation  of respiratory  c/o's or need for noct saba. Also denies any obvious fluctuation of symptoms with weather or environmental changes or other aggravating or alleviating factors except as outlined above   No unusual exposure hx or h/o childhood pna/ asthma or knowledge of premature birth.  Current Allergies, Complete Past Medical History, Past Surgical History, Family History, and Social History were reviewed in [employer] record.  ROS   The following are not active complaints unless bolded Hoarseness, sore throat, dysphagia, dental problems, itching, sneezing,  nasal congestion or discharge of excess mucus or purulent secretions, ear ache,   fever, chills, sweats, unintended wt loss or wt gain, classically pleuritic or exertional cp,  orthopnea pnd or arm/hand swelling  or leg swelling, presyncope, palpitations, abdominal pain, anorexia, nausea, vomiting, diarrhea  or change in bowel habits or change in bladder habits, change in stools or change in urine, dysuria, hematuria,  rash, arthralgias, visual complaints, headache, numbness, weakness or ataxia or problems with walking or coordination,  change in mood or  memory.        Current Meds  Medication Sig   acetaminophen (TYLENOL) 650 MG CR tablet Take 1,300 mg by mouth every 8 (eight) hours as needed for pain.   apixaban (ELIQUIS) 5 MG TABS tablet TAKE 1 TABLET BY MOUTH TWICE A DAY (Patient taking differently: Take 5 mg by mouth 2 (two) times daily.)   aspirin EC 81 MG tablet Take 81 mg by mouth daily. Swallow whole.   Blood Glucose Monitoring Suppl (ONETOUCH VERIO REFLECT) w/Device KIT Use to test blood sugars daily as directed. DX: E11.9   Cyanocobalamin (B-12 PO) Take 1 tablet by mouth daily.   fluticasone (FLONASE) 50 MCG/ACT nasal spray PLACE 1 SPRAY INTO BOTH NOSTRILS DAILY AS NEEDED FOR ALLERGIES OR RHINITIS.   Fluticasone-Umeclidin-Vilant (TRELEGY ELLIPTA) 100-62.5-25 MCG/INH AEPB Inhale 1 puff into the lungs daily.   gabapentin (NEURONTIN) 300 MG capsule TAKE 1 CAPSULE BY MOUTH THREE TIMES A DAY (Patient taking differently: Take 300-600 mg by mouth See admin instructions. Taking 300 mg in the afternoon and 600 mg at bedtime)   Iron, Ferrous Sulfate, 325 (65 Fe) MG TABS Take 325 mg by mouth 2 (two) times daily. (Patient taking differently: Take 325 mg by mouth every evening.)   KLOR-CON M20 20 MEQ tablet TAKE 1 TABLET BY MOUTH EVERY DAY (Patient taking differently: Take 20  mEq by mouth daily.)   levalbuterol (XOPENEX HFA) 45 MCG/ACT inhaler Inhale 2 puffs into the lungs every 6 (six) hours as needed for wheezing or shortness of breath.   levalbuterol (XOPENEX) 0.63 MG/3ML nebulizer solution Take 3 mLs (0.63 mg total) by nebulization every 6 (six) hours as needed for wheezing or shortness of breath.   levothyroxine (SYNTHROID) 50 MCG tablet TAKE 1 TABLET BY MOUTH EVERY DAY (Patient taking differently: Take 50 mcg by mouth daily before breakfast.)   linaclotide (LINZESS) 72 MCG capsule Take 1 capsule (72 mcg total) by mouth daily before breakfast. (Patient taking differently: Take 72 mcg by mouth  daily as needed (constipation).)   Melatonin 10 MG TABS Take 10 mg by mouth at bedtime as needed (sleep).   nebivolol (BYSTOLIC) 2.5 MG tablet TAKE 1 TABLET BY MOUTH EVERY DAY (Patient taking differently: Take 2.5 mg by mouth daily.)   NON FORMULARY CPAP at bedtime   ondansetron (ZOFRAN) 4 MG tablet Take 1 tablet (4 mg total) by mouth every 8 (eight) hours as needed for nausea or vomiting.   OneTouch Delica Lancets 45X MISC Use to test blood sugars daily as directed. DX: E11.9   ONETOUCH VERIO test strip USE TO TEST BLOOD SUGARS DAILY AS DIRECTED. DX: E11.9   OXYGEN Inhale 2 L into the lungs continuous.   pantoprazole (PROTONIX) 40 MG tablet Take 1 tablet (40 mg total) by mouth daily.   pravastatin (PRAVACHOL) 40 MG tablet TAKE 1 TABLET (40 MG TOTAL) BY MOUTH ON MONDAYS , WEDNESDAYS, AND FRIDAYS AS DIRECTED (Patient taking differently: Take 40 mg by mouth every Monday, Wednesday, and Friday. 5 pm)   Semaglutide (RYBELSUS) 7 MG TABS Take 7 mg by mouth daily.   torsemide (DEMADEX) 20 MG tablet Take 2 tablets (40 mg) in the morning, take 2 tablets (40 mg) in the evening daily as needed. (Patient taking differently: Take 20 mg by mouth See admin instructions. Take 2 tablets (40 mg) in the morning, take 2 tablets (40 mg) in the evening daily as needed for swelling)   traZODone  (DESYREL) 50 MG tablet Take 0.5 tablets (25 mg total) by mouth at bedtime.                 Past Medical History:  Diagnosis Date   Adenocarcinoma of lung, right ([place]) 04/18/2016   Anxiety    Arthritis    Asthma    CHF (congestive heart failure) (H[facility])    COPD (chronic obstructive pulmonary disease) ([place])    Depression    Diabetes mellitus without complication ([facility])    no meds   DM (diabetes mellitus) ([place]) 07/09/2016   Dyspnea    History of kidney stones    Hyperlipidemia    Hypertension    Macular degeneration    Neuropathy    Non-small cell lung cancer, right ([place]) 04/18/2016   Prostatitis    Pulmonary nodule, left 07/16/2016   Sleep apnea    cpap      Objective:    Wts  09/25/2021       158   11/08/2020        151    12/14/19 183 lb 9.6 oz (83.3 kg)  12/07/19 183 lb 3.2 oz (83.1 kg)  11/24/19 183 lb 3.2 oz (83.1 kg)    Vital signs reviewed  09/25/2021  - Note at rest 02 sats  96% on RA   General appearance:    amb slt pale wm/ pseudowheeze noted   HEENT : Oropharynx  clear   Nasal turbinates nl    NECK :  without  apparent JVD/ palpable Nodes/TM    LUNGS: no acc muscle use,  Mild barrel  contour chest wall with bilateral  Distant bs s audible wheeze and  without cough on insp or exp maneuvers  and mild  Hyperresonant  to  percussion bilaterally     CV:  RRR  no s3 or murmur or increase in P2, and  trace bilateral LE edema   ABD:  soft and nontender with pos end  insp Hoover's  in the supine position.  No bruits or organomegaly appreciated  MS:  Nl gait/ ext warm without deformities Or obvious joint restrictions  calf tenderness, cyanosis or clubbing     SKIN: warm and dry without lesions    NEURO:  alert, approp, nl sensorium with  no motor or cerebellar deficits apparent.         I personally reviewed images and agree with radiology impression as follows:  CXR:   pa and lateral 09/13/21  Progressive right posterior perihilar pulmonary opacity,  possibly representing post radiation change.         Assessment

## 2021-09-26 ENCOUNTER — Encounter: Payer: Self-pay | Admitting: Internal Medicine

## 2021-09-26 ENCOUNTER — Encounter: Payer: Self-pay | Admitting: Family Medicine

## 2021-09-26 ENCOUNTER — Ambulatory Visit (INDEPENDENT_AMBULATORY_CARE_PROVIDER_SITE_OTHER): Payer: Medicare Other | Admitting: Family Medicine

## 2021-09-26 VITALS — BP 110/59 | HR 82 | Temp 98.0°F | Ht 66.0 in | Wt 158.0 lb

## 2021-09-26 DIAGNOSIS — R42 Dizziness and giddiness: Secondary | ICD-10-CM

## 2021-09-26 DIAGNOSIS — J9611 Chronic respiratory failure with hypoxia: Secondary | ICD-10-CM | POA: Diagnosis not present

## 2021-09-26 DIAGNOSIS — K253 Acute gastric ulcer without hemorrhage or perforation: Secondary | ICD-10-CM | POA: Diagnosis not present

## 2021-09-26 DIAGNOSIS — E1165 Type 2 diabetes mellitus with hyperglycemia: Secondary | ICD-10-CM | POA: Diagnosis not present

## 2021-09-26 DIAGNOSIS — C3491 Malignant neoplasm of unspecified part of right bronchus or lung: Secondary | ICD-10-CM | POA: Diagnosis not present

## 2021-09-26 DIAGNOSIS — R231 Pallor: Secondary | ICD-10-CM | POA: Diagnosis not present

## 2021-09-26 DIAGNOSIS — I5033 Acute on chronic diastolic (congestive) heart failure: Secondary | ICD-10-CM | POA: Diagnosis not present

## 2021-09-26 DIAGNOSIS — I110 Hypertensive heart disease with heart failure: Secondary | ICD-10-CM | POA: Diagnosis not present

## 2021-09-26 DIAGNOSIS — J441 Chronic obstructive pulmonary disease with (acute) exacerbation: Secondary | ICD-10-CM | POA: Diagnosis not present

## 2021-09-26 NOTE — Assessment & Plan Note (Addendum)
Active smoker - Spirometry 10/24/2019  FEV1 0.94 (32%)  Ratio 0.48 - 10/24/2019  After extensive coaching inhaler device,  effectiveness =    90% > change breo 200/ incruse to Trelegy 100 q am  - alpha one AT screen  12/14/2019 >>>   MM level 147  - 11/08/2020   Walked on ra x one lap =  approx 250 ft -@ relatively slow  Pace withcane stopped due to leg pain with  min sob / sats 93%  - 09/25/2021   Walked on RA  x  2.5  lap(s) =  approx 400  ft  @ slow pace, stopped due to sob and leg pain  with lowest 02 sats 89%   Group D (now reclassified as E) in terms of symptom/risk and laba/lama/ICS  therefore appropriate rx at this point >>>  trelegy and approp saba  - The proper method of use, as well as anticipated side effects, of a metered-dose inhaler were discussed and demonstrated to the patient using teach back method  >>> needs to bring inhalers next ov   ? Mild aecopd > ok to repeat pred x 6 d and zpak   >>> does not meet critieria for amb 02 at present but does have it at home and rec maintain over 90% with exertion when feasible.

## 2021-09-26 NOTE — Progress Notes (Signed)
BP (!) 110/59   Pulse 82   Temp 98 F (36.7 C)   Ht 5' 6"  (1.676 m)   Wt 158 lb (71.7 kg)   SpO2 95%   BMI 25.50 kg/m    Subjective:   Patient ID: John [name], male    DOB: 1946-03-16, 76 y.o.   MRN: [id number]  HPI: John [name] is a 76 y.o. male presenting on 09/26/2021 for Hypotension (Pt states that he feels like he is going to faint)   HPI Patient is coming in today with feeling hypotension and feeling faint and feeling dizzy and feeling weak.  He was in the hospital on 09/13/2021 for symptomatic anemia and GI hemorrhage and CHF.  He had scope and some diuresis and he says his breathing is doing a lot better but he just still feels weak.  He has not noticed any blood in his stool but he is taking iron so his stools been very dark.  He did have a repeat Pete CBC on 09/19/2021 which showed that his hemoglobin was stable at 9.0 but he says is really just been feeling worse over the past week.  He does have some swelling in his legs but he says his weights are actually down and he said his weight is 156.4 and he says he normally weighs around 160 pounds when he left the hospital.  Relevant past medical, surgical, family and social history reviewed and updated as indicated. Interim medical history since our last visit reviewed. Allergies and medications reviewed and updated.  Review of Systems  Constitutional:  Negative for chills and fever.  Eyes:  Negative for visual disturbance.  Respiratory:  Negative for shortness of breath and wheezing.   Cardiovascular:  Positive for leg swelling. Negative for chest pain and palpitations.  Musculoskeletal:  Negative for arthralgias, back pain and gait problem.  Skin:  Negative for rash.  Neurological:  Positive for dizziness, weakness and light-headedness. Negative for numbness.  All other systems reviewed and are negative.   Per HPI unless specifically indicated above   Allergies as of 09/26/2021       Reactions   Sulfa  Antibiotics Swelling   Mouth swelling   Atorvastatin Other (See Comments)   Muscle aches - tolerating Pravastatin 40 mg MWF   Jardiance [empagliflozin] Other (See Comments)   FEELS SLUGGISH, TIRED   Lopressor [metoprolol] Other (See Comments)   Fatigue   Rosuvastatin Other (See Comments)   Muscle aches - tolerating Pravastatin 40 mg MWF   Doxycycline Nausea Only   Temazepam Other (See Comments)   Made insomnia worse         Medication List        Accurate as of September 26, 2021  4:28 PM. If you have any questions, ask your nurse or doctor.          acetaminophen 650 MG CR tablet Commonly known as: TYLENOL Take 1,300 mg by mouth every 8 (eight) hours as needed for pain.   aspirin EC 81 MG tablet Take 81 mg by mouth daily. Swallow whole.   azithromycin 250 MG tablet Commonly known as: ZITHROMAX Take 2 on day one then 1 daily x 4 days   B-12 PO Take 1 tablet by mouth daily.   Eliquis 5 MG Tabs tablet Generic drug: apixaban TAKE 1 TABLET BY MOUTH TWICE A DAY What changed: how much to take   famotidine 20 MG tablet Commonly known as: Pepcid One after supper   fluticasone 50  MCG/ACT nasal spray Commonly known as: FLONASE PLACE 1 SPRAY INTO BOTH NOSTRILS DAILY AS NEEDED FOR ALLERGIES OR RHINITIS.   gabapentin 300 MG capsule Commonly known as: NEURONTIN TAKE 1 CAPSULE BY MOUTH THREE TIMES A DAY What changed: See the new instructions.   Iron (Ferrous Sulfate) 325 (65 Fe) MG Tabs Take 325 mg by mouth 2 (two) times daily. What changed: when to take this   Klor-Con M20 20 MEQ tablet Generic drug: potassium chloride SA TAKE 1 TABLET BY MOUTH EVERY DAY What changed: how much to take   levalbuterol 0.63 MG/3ML nebulizer solution Commonly known as: XOPENEX Take 3 mLs (0.63 mg total) by nebulization every 6 (six) hours as needed for wheezing or shortness of breath.   levalbuterol 45 MCG/ACT inhaler Commonly known as: Xopenex HFA Inhale 2 puffs into the lungs  every 6 (six) hours as needed for wheezing or shortness of breath.   levothyroxine 50 MCG tablet Commonly known as: SYNTHROID TAKE 1 TABLET BY MOUTH EVERY DAY What changed: when to take this   linaclotide 72 MCG capsule Commonly known as: Linzess Take 1 capsule (72 mcg total) by mouth daily before breakfast. What changed:  when to take this reasons to take this   Melatonin 10 MG Tabs Take 10 mg by mouth at bedtime as needed (sleep).   nebivolol 2.5 MG tablet Commonly known as: BYSTOLIC TAKE 1 TABLET BY MOUTH EVERY DAY   NON FORMULARY CPAP at bedtime   ondansetron 4 MG tablet Commonly known as: Zofran Take 1 tablet (4 mg total) by mouth every 8 (eight) hours as needed for nausea or vomiting.   OneTouch Delica Lancets 64Q Misc Use to test blood sugars daily as directed. DX: E11.9   OneTouch Verio Reflect w/Device Kit Use to test blood sugars daily as directed. DX: E11.9   OneTouch Verio test strip Generic drug: glucose blood USE TO TEST BLOOD SUGARS DAILY AS DIRECTED. DX: E11.9   OXYGEN Inhale 2 L into the lungs continuous.   pantoprazole 40 MG tablet Commonly known as: Protonix Take 1 tablet (40 mg total) by mouth daily.   pravastatin 40 MG tablet Commonly known as: PRAVACHOL TAKE 1 TABLET (40 MG TOTAL) BY MOUTH ON MONDAYS , WEDNESDAYS, AND FRIDAYS AS DIRECTED What changed: See the new instructions.   predniSONE 10 MG tablet Commonly known as: DELTASONE Take  4 each am x 2 days,   2 each am x 2 days,  1 each am x 2 days and stop   Rybelsus 7 MG Tabs Generic drug: Semaglutide Take 7 mg by mouth daily.   torsemide 20 MG tablet Commonly known as: DEMADEX Take 2 tablets (40 mg) in the morning, take 2 tablets (40 mg) in the evening daily as needed. What changed:  how much to take how to take this when to take this additional instructions   traZODone 50 MG tablet Commonly known as: DESYREL Take 0.5 tablets (25 mg total) by mouth at bedtime.   Trelegy  Ellipta 100-62.5-25 MCG/ACT Aepb Generic drug: Fluticasone-Umeclidin-Vilant Inhale 1 puff into the lungs daily.         Objective:   BP (!) 110/59   Pulse 82   Temp 98 F (36.7 C)   Ht 5' 6"  (1.676 m)   Wt 158 lb (71.7 kg)   SpO2 95%   BMI 25.50 kg/m   Wt Readings from Last 3 Encounters:  09/26/21 158 lb (71.7 kg)  09/25/21 158 lb (71.7 kg)  09/19/21 163 lb (  73.9 kg)    Physical Exam Vitals and nursing note reviewed.  Constitutional:      General: He is not in acute distress.    Appearance: He is well-developed. He is not diaphoretic.  Eyes:     General: No scleral icterus.    Conjunctiva/sclera: Conjunctivae normal.  Neck:     Thyroid: No thyromegaly.  Cardiovascular:     Rate and Rhythm: Normal rate and regular rhythm.     Heart sounds: Normal heart sounds. No murmur heard. Pulmonary:     Effort: Pulmonary effort is normal. No respiratory distress.     Breath sounds: Normal breath sounds. No wheezing.  Musculoskeletal:        General: Swelling (1+ pitting edema in bilateral lower extremity) present. Normal range of motion.     Cervical back: Neck supple.  Lymphadenopathy:     Cervical: No cervical adenopathy.  Skin:    General: Skin is warm and dry.     Findings: No rash.  Neurological:     Mental Status: He is alert and oriented to person, place, and time.     Coordination: Coordination normal.  Psychiatric:        Behavior: Behavior normal.       Assessment & Plan:   Problem List Items Addressed This Visit       Cardiovascular and Mediastinum   Acute gastric ulcer without hemorrhage or perforation - Primary   Relevant Orders   Hemoglobin, fingerstick   CBC with Differential/Platelet   CMP14+EGFR   Other Visit Diagnoses     Lightheadedness       Relevant Orders   Hemoglobin, fingerstick   CBC with Differential/Platelet   CMP14+EGFR   [place]       Relevant Orders   Hemoglobin, fingerstick   CBC with Differential/Platelet   CMP14+EGFR      Rapid hemoglobin is 8.7 plan, will await the rest of his blood work.  I may have him reduce his torsemide by 1 dose a day for the next 3 days to see if that helps with his hypertension.  Follow up plan: Return if symptoms worsen or fail to improve, for 2 to 3-week follow-up for anemia and lightheadedness.  Counseling provided for all of the vaccine components Orders Placed This Encounter  Procedures   Hemoglobin, fingerstick   CBC with Differential/Platelet   CMP14+EGFR    Caryl Pina, MD [facility] 09/26/2021, 4:28 PM

## 2021-09-27 DIAGNOSIS — J449 Chronic obstructive pulmonary disease, unspecified: Secondary | ICD-10-CM | POA: Diagnosis not present

## 2021-09-27 DIAGNOSIS — D630 Anemia in neoplastic disease: Secondary | ICD-10-CM | POA: Diagnosis not present

## 2021-09-27 DIAGNOSIS — K219 Gastro-esophageal reflux disease without esophagitis: Secondary | ICD-10-CM | POA: Diagnosis not present

## 2021-09-27 DIAGNOSIS — J9611 Chronic respiratory failure with hypoxia: Secondary | ICD-10-CM | POA: Diagnosis not present

## 2021-09-27 DIAGNOSIS — H3530 Unspecified macular degeneration: Secondary | ICD-10-CM | POA: Diagnosis not present

## 2021-09-27 DIAGNOSIS — T451X5D Adverse effect of antineoplastic and immunosuppressive drugs, subsequent encounter: Secondary | ICD-10-CM | POA: Diagnosis not present

## 2021-09-27 DIAGNOSIS — M1990 Unspecified osteoarthritis, unspecified site: Secondary | ICD-10-CM | POA: Diagnosis not present

## 2021-09-27 DIAGNOSIS — E032 Hypothyroidism due to medicaments and other exogenous substances: Secondary | ICD-10-CM | POA: Diagnosis not present

## 2021-09-27 DIAGNOSIS — E1169 Type 2 diabetes mellitus with other specified complication: Secondary | ICD-10-CM | POA: Diagnosis not present

## 2021-09-27 DIAGNOSIS — I4892 Unspecified atrial flutter: Secondary | ICD-10-CM | POA: Diagnosis not present

## 2021-09-27 DIAGNOSIS — N401 Enlarged prostate with lower urinary tract symptoms: Secondary | ICD-10-CM | POA: Diagnosis not present

## 2021-09-27 DIAGNOSIS — G4733 Obstructive sleep apnea (adult) (pediatric): Secondary | ICD-10-CM | POA: Diagnosis not present

## 2021-09-27 DIAGNOSIS — I480 Paroxysmal atrial fibrillation: Secondary | ICD-10-CM | POA: Diagnosis not present

## 2021-09-27 DIAGNOSIS — E1151 Type 2 diabetes mellitus with diabetic peripheral angiopathy without gangrene: Secondary | ICD-10-CM | POA: Diagnosis not present

## 2021-09-27 DIAGNOSIS — C3491 Malignant neoplasm of unspecified part of right bronchus or lung: Secondary | ICD-10-CM | POA: Diagnosis not present

## 2021-09-27 DIAGNOSIS — E1140 Type 2 diabetes mellitus with diabetic neuropathy, unspecified: Secondary | ICD-10-CM | POA: Diagnosis not present

## 2021-09-27 DIAGNOSIS — E1159 Type 2 diabetes mellitus with other circulatory complications: Secondary | ICD-10-CM | POA: Diagnosis not present

## 2021-09-27 DIAGNOSIS — E782 Mixed hyperlipidemia: Secondary | ICD-10-CM | POA: Diagnosis not present

## 2021-09-27 DIAGNOSIS — R338 Other retention of urine: Secondary | ICD-10-CM | POA: Diagnosis not present

## 2021-09-27 DIAGNOSIS — I152 Hypertension secondary to endocrine disorders: Secondary | ICD-10-CM | POA: Diagnosis not present

## 2021-09-27 DIAGNOSIS — I5033 Acute on chronic diastolic (congestive) heart failure: Secondary | ICD-10-CM | POA: Diagnosis not present

## 2021-09-27 DIAGNOSIS — F17210 Nicotine dependence, cigarettes, uncomplicated: Secondary | ICD-10-CM | POA: Diagnosis not present

## 2021-09-27 DIAGNOSIS — F32A Depression, unspecified: Secondary | ICD-10-CM | POA: Diagnosis not present

## 2021-09-27 DIAGNOSIS — E1165 Type 2 diabetes mellitus with hyperglycemia: Secondary | ICD-10-CM | POA: Diagnosis not present

## 2021-09-27 DIAGNOSIS — F419 Anxiety disorder, unspecified: Secondary | ICD-10-CM | POA: Diagnosis not present

## 2021-09-27 LAB — CMP14+EGFR
ALT: 19 IU/L (ref 0–44)
AST: 20 IU/L (ref 0–40)
Albumin/Globulin Ratio: 1.6 (ref 1.2–2.2)
Albumin: 3.6 g/dL — ABNORMAL LOW (ref 3.7–4.7)
Alkaline Phosphatase: 143 IU/L — ABNORMAL HIGH (ref 44–121)
BUN/Creatinine Ratio: 28 — ABNORMAL HIGH (ref 10–24)
BUN: 32 mg/dL — ABNORMAL HIGH (ref 8–27)
Bilirubin Total: 0.4 mg/dL (ref 0.0–1.2)
CO2: 25 mmol/L (ref 20–29)
Calcium: 8.5 mg/dL — ABNORMAL LOW (ref 8.6–10.2)
Chloride: 92 mmol/L — ABNORMAL LOW (ref 96–106)
Creatinine, Ser: 1.16 mg/dL (ref 0.76–1.27)
Globulin, Total: 2.2 g/dL (ref 1.5–4.5)
Glucose: 312 mg/dL — ABNORMAL HIGH (ref 70–99)
Potassium: 5.1 mmol/L (ref 3.5–5.2)
Sodium: 135 mmol/L (ref 134–144)
Total Protein: 5.8 g/dL — ABNORMAL LOW (ref 6.0–8.5)
eGFR: 66 mL/min/{1.73_m2} (ref >59)

## 2021-09-27 LAB — CBC WITH DIFFERENTIAL/PLATELET
Basophils Absolute: 0 10*3/uL (ref 0.0–0.2)
Basos: 0 %
EOS (ABSOLUTE): 0 10*3/uL (ref 0.0–0.4)
Eos: 0 %
Hematocrit: 26.4 % — ABNORMAL LOW (ref 37.5–51.0)
Hemoglobin: 8.4 g/dL — CL (ref 13.0–17.7)
Immature Grans (Abs): 0.1 10*3/uL (ref 0.0–0.1)
Immature Granulocytes: 1 %
Lymphocytes Absolute: 0.2 10*3/uL — ABNORMAL LOW (ref 0.7–3.1)
Lymphs: 3 %
MCH: 34.3 pg — ABNORMAL HIGH (ref 26.6–33.0)
MCHC: 31.8 g/dL (ref 31.5–35.7)
MCV: 108 fL — ABNORMAL HIGH (ref 79–97)
Monocytes Absolute: 0.3 10*3/uL (ref 0.1–0.9)
Monocytes: 3 %
NRBC: 1 % — ABNORMAL HIGH (ref 0–0)
Neutrophils Absolute: 9.1 10*3/uL — ABNORMAL HIGH (ref 1.4–7.0)
Neutrophils: 93 %
Platelets: 301 10*3/uL (ref 150–450)
RBC: 2.45 x10E6/uL — CL (ref 4.14–5.80)
RDW: 15.3 % (ref 11.6–15.4)
WBC: 9.8 10*3/uL (ref 3.4–10.8)

## 2021-09-27 LAB — HEMOGLOBIN, FINGERSTICK: Hemoglobin: 8.7 g/dL — ABNORMAL LOW (ref 12.6–17.7)

## 2021-09-30 ENCOUNTER — Other Ambulatory Visit: Payer: Self-pay | Admitting: Family

## 2021-09-30 DIAGNOSIS — E1159 Type 2 diabetes mellitus with other circulatory complications: Secondary | ICD-10-CM | POA: Diagnosis not present

## 2021-09-30 DIAGNOSIS — E1169 Type 2 diabetes mellitus with other specified complication: Secondary | ICD-10-CM

## 2021-09-30 DIAGNOSIS — I152 Hypertension secondary to endocrine disorders: Secondary | ICD-10-CM | POA: Diagnosis not present

## 2021-09-30 DIAGNOSIS — E1142 Type 2 diabetes mellitus with diabetic polyneuropathy: Secondary | ICD-10-CM

## 2021-09-30 DIAGNOSIS — J9611 Chronic respiratory failure with hypoxia: Secondary | ICD-10-CM | POA: Diagnosis not present

## 2021-09-30 DIAGNOSIS — C3491 Malignant neoplasm of unspecified part of right bronchus or lung: Secondary | ICD-10-CM | POA: Diagnosis not present

## 2021-09-30 DIAGNOSIS — J449 Chronic obstructive pulmonary disease, unspecified: Secondary | ICD-10-CM | POA: Diagnosis not present

## 2021-09-30 DIAGNOSIS — I5033 Acute on chronic diastolic (congestive) heart failure: Secondary | ICD-10-CM | POA: Diagnosis not present

## 2021-10-01 ENCOUNTER — Telehealth: Payer: Self-pay | Admitting: Family

## 2021-10-01 ENCOUNTER — Other Ambulatory Visit: Payer: Medicare Other

## 2021-10-01 DIAGNOSIS — E1159 Type 2 diabetes mellitus with other circulatory complications: Secondary | ICD-10-CM | POA: Diagnosis not present

## 2021-10-01 DIAGNOSIS — I152 Hypertension secondary to endocrine disorders: Secondary | ICD-10-CM | POA: Diagnosis not present

## 2021-10-01 DIAGNOSIS — D649 Anemia, unspecified: Secondary | ICD-10-CM

## 2021-10-01 DIAGNOSIS — J449 Chronic obstructive pulmonary disease, unspecified: Secondary | ICD-10-CM | POA: Diagnosis not present

## 2021-10-01 DIAGNOSIS — J9611 Chronic respiratory failure with hypoxia: Secondary | ICD-10-CM | POA: Diagnosis not present

## 2021-10-01 DIAGNOSIS — C3491 Malignant neoplasm of unspecified part of right bronchus or lung: Secondary | ICD-10-CM | POA: Diagnosis not present

## 2021-10-01 DIAGNOSIS — I5033 Acute on chronic diastolic (congestive) heart failure: Secondary | ICD-10-CM | POA: Diagnosis not present

## 2021-10-02 ENCOUNTER — Telehealth: Payer: Self-pay | Admitting: Family

## 2021-10-02 DIAGNOSIS — J9611 Chronic respiratory failure with hypoxia: Secondary | ICD-10-CM | POA: Diagnosis not present

## 2021-10-02 DIAGNOSIS — C3491 Malignant neoplasm of unspecified part of right bronchus or lung: Secondary | ICD-10-CM | POA: Diagnosis not present

## 2021-10-02 DIAGNOSIS — I5033 Acute on chronic diastolic (congestive) heart failure: Secondary | ICD-10-CM | POA: Diagnosis not present

## 2021-10-02 DIAGNOSIS — I152 Hypertension secondary to endocrine disorders: Secondary | ICD-10-CM | POA: Diagnosis not present

## 2021-10-02 DIAGNOSIS — E1159 Type 2 diabetes mellitus with other circulatory complications: Secondary | ICD-10-CM | POA: Diagnosis not present

## 2021-10-02 DIAGNOSIS — J449 Chronic obstructive pulmonary disease, unspecified: Secondary | ICD-10-CM | POA: Diagnosis not present

## 2021-10-02 LAB — CBC WITH DIFFERENTIAL/PLATELET
Basophils Absolute: 0 10*3/uL (ref 0.0–0.2)
Basos: 0 %
EOS (ABSOLUTE): 0 10*3/uL (ref 0.0–0.4)
Eos: 0 %
Hematocrit: 26 % — ABNORMAL LOW (ref 37.5–51.0)
Hemoglobin: 8.3 g/dL — CL (ref 13.0–17.7)
Immature Grans (Abs): 0.1 10*3/uL (ref 0.0–0.1)
Immature Granulocytes: 1 %
Lymphocytes Absolute: 0.4 10*3/uL — ABNORMAL LOW (ref 0.7–3.1)
Lymphs: 3 %
MCH: 35.3 pg — ABNORMAL HIGH (ref 26.6–33.0)
MCHC: 31.9 g/dL (ref 31.5–35.7)
MCV: 111 fL — ABNORMAL HIGH (ref 79–97)
Monocytes Absolute: 0.7 10*3/uL (ref 0.1–0.9)
Monocytes: 5 %
NRBC: 1 % — ABNORMAL HIGH (ref 0–0)
Neutrophils Absolute: 12.1 10*3/uL — ABNORMAL HIGH (ref 1.4–7.0)
Neutrophils: 91 %
Platelets: 319 10*3/uL (ref 150–450)
RBC: 2.35 x10E6/uL — CL (ref 4.14–5.80)
RDW: 16.7 % — ABNORMAL HIGH (ref 11.6–15.4)
WBC: 13.4 10*3/uL — ABNORMAL HIGH (ref 3.4–10.8)

## 2021-10-02 NOTE — Telephone Encounter (Signed)
Patient aware of lab results.  Patient asked how his kidneys look in regards to lab results.

## 2021-10-02 NOTE — Telephone Encounter (Signed)
Dettinger, Fransisca Kaufmann, MD  P [facility] Clinical Pool Patient's hemoglobin came back stable at 8.3, just continue to follow-up with PCP in the future, I think he had an appointment in the near future but it looks like he is not losing any more blood currently.

## 2021-10-03 ENCOUNTER — Other Ambulatory Visit: Payer: Self-pay | Admitting: Family

## 2021-10-03 ENCOUNTER — Telehealth: Payer: Self-pay | Admitting: Cardiovascular Disease

## 2021-10-03 DIAGNOSIS — C3491 Malignant neoplasm of unspecified part of right bronchus or lung: Secondary | ICD-10-CM

## 2021-10-03 DIAGNOSIS — J9611 Chronic respiratory failure with hypoxia: Secondary | ICD-10-CM

## 2021-10-03 DIAGNOSIS — J449 Chronic obstructive pulmonary disease, unspecified: Secondary | ICD-10-CM

## 2021-10-03 NOTE — Telephone Encounter (Signed)
Pt c/o medication issue:  1. Name of Medication: nebivolol (BYSTOLIC) 2.5 MG tablet  2. How are you currently taking this medication (dosage and times per day)? TAKE 1 TABLET BY MOUTH EVERY DAYPatient taking differently: Take 2.5 mg by mouth daily.  3. Are you having a reaction (difficulty breathing--STAT)?   4. What is your medication issue? Patient states it makes his chest hurt when he gets in the shower, he feels kind a faint.      Pt c/o swelling: STAT is pt has developed SOB within 24 hours  If swelling, where is the swelling located? Feet and legs, stomach a little bit.   How much weight have you gained and in what time span? Gained 3 lbs in 3 days   Have you gained 3 pounds in a day or 5 pounds in a week? no  Do you have a log of your daily weights (if so, list)? no  Are you currently taking a fluid pill? yes  Are you currently SOB? Sometimes he is   Have you traveled recently? no

## 2021-10-03 NOTE — Telephone Encounter (Signed)
Returned call to Pt.  He states since he has started the nebivolol he has chest discomfort-this seems to be related to showering.  He also states he has some shortness of breath and a feeling like he may pass out, but he knows he is not going to.  Off-balance.  He states he did not take his nebivolol this morning.  He reports recent BP's were:  111/76 and 110/73.  Additionally, his weight has increased 3 pounds over the last 3 days.  He weighed 155 pounds 2 days ago, weight today is 158.  He states he has been taking the torsemide 40 mg PO BID the last 3 days.  He was recently advised by his PCP to drink Ensure TID-which he is uncertain if this is effecting weight.  Advised would forward to Dr. Audie Box to review and advise.

## 2021-10-04 DIAGNOSIS — I5033 Acute on chronic diastolic (congestive) heart failure: Secondary | ICD-10-CM | POA: Diagnosis not present

## 2021-10-04 DIAGNOSIS — J449 Chronic obstructive pulmonary disease, unspecified: Secondary | ICD-10-CM | POA: Diagnosis not present

## 2021-10-04 DIAGNOSIS — J9611 Chronic respiratory failure with hypoxia: Secondary | ICD-10-CM | POA: Diagnosis not present

## 2021-10-04 DIAGNOSIS — E1159 Type 2 diabetes mellitus with other circulatory complications: Secondary | ICD-10-CM | POA: Diagnosis not present

## 2021-10-04 DIAGNOSIS — I152 Hypertension secondary to endocrine disorders: Secondary | ICD-10-CM | POA: Diagnosis not present

## 2021-10-04 DIAGNOSIS — C3491 Malignant neoplasm of unspecified part of right bronchus or lung: Secondary | ICD-10-CM | POA: Diagnosis not present

## 2021-10-04 NOTE — Telephone Encounter (Signed)
Called and spoke to patient. Instruction given to patient per Dr Jenetta DownerNori Riis  order.    Patient aware will  stop  taking Nebivolol .  Take torsemide twice a day for another 3 days then reduce to once a day.   Continue to weigh himself daily first thing in the morning.    Aware to contact office if  needed and verbalized understanding.   Discontinue Nebivolol from medication list

## 2021-10-04 NOTE — Telephone Encounter (Signed)
Left a message to call back.

## 2021-10-04 NOTE — Telephone Encounter (Signed)
Pt is returning call.  

## 2021-10-04 NOTE — Telephone Encounter (Signed)
Stop nebivolol. 3 more days of lasix BID. Tell him to watch salt intake. Back to lasix daily after that day.   Best,   [place] Bells T. Audie Box, MD, [place]  [address], [place]  [place], [place] [address]  [phone]  5:30 PM

## 2021-10-07 ENCOUNTER — Other Ambulatory Visit: Payer: Medicare Other

## 2021-10-07 ENCOUNTER — Telehealth: Payer: Self-pay | Admitting: *Deleted

## 2021-10-07 ENCOUNTER — Encounter: Payer: Self-pay | Admitting: Nurse Practitioner

## 2021-10-07 ENCOUNTER — Telehealth: Payer: Self-pay | Admitting: Cardiovascular Disease

## 2021-10-07 ENCOUNTER — Ambulatory Visit (INDEPENDENT_AMBULATORY_CARE_PROVIDER_SITE_OTHER): Payer: Medicare Other | Admitting: Nurse Practitioner

## 2021-10-07 DIAGNOSIS — R8290 Unspecified abnormal findings in urine: Secondary | ICD-10-CM

## 2021-10-07 DIAGNOSIS — I5033 Acute on chronic diastolic (congestive) heart failure: Secondary | ICD-10-CM | POA: Diagnosis not present

## 2021-10-07 DIAGNOSIS — I152 Hypertension secondary to endocrine disorders: Secondary | ICD-10-CM | POA: Diagnosis not present

## 2021-10-07 DIAGNOSIS — J9611 Chronic respiratory failure with hypoxia: Secondary | ICD-10-CM | POA: Diagnosis not present

## 2021-10-07 DIAGNOSIS — J449 Chronic obstructive pulmonary disease, unspecified: Secondary | ICD-10-CM | POA: Diagnosis not present

## 2021-10-07 DIAGNOSIS — E1159 Type 2 diabetes mellitus with other circulatory complications: Secondary | ICD-10-CM | POA: Diagnosis not present

## 2021-10-07 DIAGNOSIS — C3491 Malignant neoplasm of unspecified part of right bronchus or lung: Secondary | ICD-10-CM | POA: Diagnosis not present

## 2021-10-07 LAB — URINALYSIS, COMPLETE
Bilirubin, UA: NEGATIVE
Glucose, UA: NEGATIVE
Ketones, UA: NEGATIVE
Leukocytes,UA: NEGATIVE
Nitrite, UA: NEGATIVE
Protein,UA: NEGATIVE
RBC, UA: NEGATIVE
Specific Gravity, UA: 1.01 (ref 1.005–1.030)
Urobilinogen, Ur: 1 mg/dL (ref 0.2–1.0)
pH, UA: 5 (ref 5.0–7.5)

## 2021-10-07 LAB — MICROSCOPIC EXAMINATION
Bacteria, UA: NONE SEEN
Epithelial Cells (non renal): NONE SEEN /hpf (ref 0–10)
RBC, Urine: NONE SEEN /hpf (ref 0–2)
Renal Epithel, UA: NONE SEEN /hpf
WBC, UA: NONE SEEN /hpf (ref 0–5)

## 2021-10-07 NOTE — Telephone Encounter (Signed)
Pt c/o swelling: STAT is pt has developed SOB within 24 hours  If swelling, where is the swelling located? Bilateral swelling in legs  How much weight have you gained and in what time span? Gained 5lbs  Have you gained 3 pounds in a day or 5 pounds in a week? Gained 5 lbs in a week  Do you have a log of your daily weights (if so, list)?   Are you currently taking a fluid pill? Yes   Are you currently SOB? Yes, having SOB for the past two days    Have you traveled recently? No   Pt c/o of Chest Pain: STAT if CP now or developed within 24 hours  1. Are you having CP right now? No. It only happens when he takes a shower   2. Are you experiencing any other symptoms (ex. SOB, nausea, vomiting, sweating)? no  3. How long have you been experiencing CP? A week.   4. Is your CP continuous or coming and going? Comes and goes   5. Have you taken Nitroglycerin? no ?

## 2021-10-07 NOTE — Telephone Encounter (Signed)
Patient contacted nurse access line over the weekend stating sleep medication is not helping and is hoping for alternate.

## 2021-10-07 NOTE — Telephone Encounter (Signed)
I contacted patient, spoke with him and his home health nurse Otila Kluver. He states that he has had significant weight gain. Per his home health nurse he has been weighing 155, 157, 157, 158, and this morning 164.2 lbs. He has taken his Torsemide and took 2 tablets this morning, he was advised to only take 1 more in the evening per last telephone call with our office. Otila Kluver states he is having swelling in his abdomen and is SOB. He states he took a urine specimen to his PCP today for UTI, but it was clear. He thinks the torsemide is not working, however his nurse thinks he is dehydrated. He states he is not "adding salt" to anything, but we did discuss not adding salt doesn't mean things do not have salt in them. I did advise we had opening at [facility] on Wednesday. However, with the weight gain and symptoms I suggested he go to ED to be evaluated if symptoms worsened, patient and home health nurse verbalized understanding.

## 2021-10-07 NOTE — Telephone Encounter (Signed)
The prednisone could be causing the problems with sleep. Has he tried increasing trazodone dose? He could try 50 mg or 100 mg.   Evelina Dun, FNP

## 2021-10-07 NOTE — Telephone Encounter (Addendum)
Patient informed, Patient has been doing 100mg . He states he will try it again and see if it will help

## 2021-10-07 NOTE — Progress Notes (Signed)
   Virtual Visit  Note Due to COVID-19 pandemic this visit was conducted virtually. This visit type was conducted due to national recommendations for restrictions regarding the COVID-19 Pandemic (e.g. social distancing, sheltering in place) in an effort to limit this patient's exposure and mitigate transmission in our community. All issues noted in this document were discussed and addressed.  A physical exam was not performed with this format.  I connected with John [name] on 10/07/21 at 11:22 by telephone and verified that I am speaking with the correct person using two identifiers. John [name] is currently located at home and a friend  is currently with him during visit. The provider, Mary-Margaret Hassell Done, FNP is located in their office at time of visit.  I discussed the limitations, risks, security and privacy concerns of performing an evaluation and management service by telephone and the availability of in person appointments. I also discussed with the patient that there may be a patient responsible charge related to this service. The patient expressed understanding and agreed to proceed.   History and Present Illness:  Urinary Tract Infection  This is a new problem. The current episode started yesterday. The problem occurs every urination. The problem has been waxing and waning. There has been no fever. He is Not sexually active. There is No history of pyelonephritis. Associated symptoms include frequency, hesitancy and urgency. Pertinent negatives include no discharge. He has tried nothing for the symptoms. The treatment provided mild relief.      Review of Systems  Genitourinary:  Positive for frequency, hesitancy and urgency.     Observations/Objective: Alert and oriented- answers all questions appropriately No distress   Assessment and Plan: John [name] in today with chief complaint of Urinary Tract Infection   1. Foul smelling urine Patient brought in urine  and was clear Force fluids - Urinalysis, Complete    Follow Up Instructions: prn    I discussed the assessment and treatment plan with the patient. The patient was provided an opportunity to ask questions and all were answered. The patient agreed with the plan and demonstrated an understanding of the instructions.   The patient was advised to call back or seek an in-person evaluation if the symptoms worsen or if the condition fails to improve as anticipated.  The above assessment and management plan was discussed with the patient. The patient verbalized understanding of and has agreed to the management plan. Patient is aware to call the clinic if symptoms persist or worsen. Patient is aware when to return to the clinic for a follow-up visit. Patient educated on when it is appropriate to go to the emergency department.   Time call ended:  1:24  I provided 12 minutes of  non face-to-face time during this encounter.    Mary-Margaret Hassell Done, FNP

## 2021-10-08 NOTE — Progress Notes (Unsigned)
Cardiology Office Note:    Date:  10/09/2021  NAME:  John [name]    MRN: [id number] DOB:  1945-06-03   PCP:  John Balloon, FNP  Cardiologist:  Evalina Field, MD  Electrophysiologist:  None   Referring MD: John Balloon, FNP   Chief Complaint  Patient presents with   Follow-up   History of Present Illness:    John [name] is a 76 y.o. male with a hx of HFpEF, PAD, Aflutter, tobacco abuse who presents for follow-up.  His weight is 162 pounds.  Has evidence of 2+ pitting edema up to his knees.  He is still eating salty meals.  Consuming bread as well as other fast foods.  We discussed that he needs to stop doing this.  Although he does not put salt on the food there is plenty of salt in the food.  He is trying to do better.  He is currently on torsemide 40 mg twice daily.  He reports this is not helping get fluid off.  We discussed starting Aldactone as well as giving him metolazone on Mondays Wednesdays and Fridays.  He also needs to work hard on reducing his salt intake.  He reports he is short of breath.  He also was recently admitted to the hospital for heart failure and anemia.  Found to have radiation gastritis of the stomach as well as an AVM in the colon.  His hemoglobin continues to trend down.  I would like to recheck labs.  Likely not safe for him to be on blood thinners unless blood counts are stabilizing.  Denies any chest pain.  Still getting short of breath.  Evidence of volume overload today.  Problem List 1. Coronary calcifications 2. Atrial flutter -Dx 08/21/2020 -CHADSVASC=5 (age, DM, PAD, HTN, CHF) -DCCV 10/05/2020 3. PAD -R femoral endarterectomy 08/20/2020 -R common iliac artery stent 08/20/2020 4. Non-small cell lung CA -2018 s/p chemo/radiation  -recurrent nodules without recurrence  5. COPD 6. Tobacco abuse 7. Gastric ulcer/Recurrent anemia -09/2021: radiation gastritis  -09/2021: colonic AVM 8. DM -A1c 4.7 9. HLD -T chol 126, HDL 28, LDL 80,  TG 89 10. HFpEF 11. OSA  Past Medical History: Past Medical History:  Diagnosis Date   Adenocarcinoma of lung, right ([place]) 04/18/2016   Anxiety    Arthritis    Asthma    Atrial flutter (HCC)    BPH (benign prostatic hyperplasia)    with urinary retention 02/06/20   CHF (congestive heart failure) (HCC)    COPD (chronic obstructive pulmonary disease) (HCC)    Depression    Diabetes mellitus, type II ([place])    Dyspnea    History of kidney stones    History of radiation therapy    right lung 09/10/2020-09/17/2020   Dr Sondra Come   Hyperlipidemia    Hypertension    Hypothyroidism    Macular degeneration    Neuropathy    Non-small cell lung cancer, right ([place]) 04/18/2016   Peripheral vascular disease ([place])    Prostatitis    Pulmonary nodule, left 07/16/2016   Sleep apnea    cpap    Past Surgical History: Past Surgical History:  Procedure Laterality Date   ABDOMINAL AORTOGRAM W/LOWER EXTREMITY Left 02/06/2020   Procedure: ABDOMINAL AORTOGRAM W/LOWER EXTREMITY;  Surgeon: Lorretta Harp, MD;  Location: [place] [facility];  Service: Cardiovascular;  Laterality: Left;   ABDOMINAL AORTOGRAM W/LOWER EXTREMITY N/A 05/30/2021   Procedure: ABDOMINAL AORTOGRAM W/LOWER EXTREMITY;  Surgeon: Monica Martinez  J, MD;  Location: [place] [facility];  Service: Cardiovascular;  Laterality: N/A;   BIOPSY  09/16/2021   Procedure: BIOPSY;  Surgeon: Gatha Mayer, MD;  Location: [place];  Service: Gastroenterology;;   CARDIOVERSION N/A 10/05/2020   Procedure: CARDIOVERSION;  Surgeon: Geralynn Rile, MD;  Location: [place];  Service: Cardiovascular;  Laterality: N/A;   CATARACT EXTRACTION, BILATERAL Bilateral    COLONOSCOPY WITH PROPOFOL N/A 06/20/2021   Procedure: COLONOSCOPY WITH PROPOFOL;  Surgeon: Irene Shipper, MD;  Location: [facility];  Service: Endoscopy;  Laterality: N/A;   COLONOSCOPY WITH PROPOFOL N/A 09/16/2021   Procedure: COLONOSCOPY WITH PROPOFOL;  Surgeon: Gatha Mayer, MD;  Location: [place];  Service: Gastroenterology;  Laterality: N/A;   ENDARTERECTOMY FEMORAL Right 08/20/2020   Procedure: ENDARTERECTOMY  RIGHT FEMORAL ARTERY;  Surgeon: Marty Heck, MD;  Location: [place];  Service: Vascular;  Laterality: Right;   ESOPHAGOGASTRODUODENOSCOPY N/A 09/16/2021   Procedure: ESOPHAGOGASTRODUODENOSCOPY (EGD);  Surgeon: Gatha Mayer, MD;  Location: [place];  Service: Gastroenterology;  Laterality: N/A;   ESOPHAGOGASTRODUODENOSCOPY (EGD) WITH PROPOFOL N/A 09/06/2020   Procedure: ESOPHAGOGASTRODUODENOSCOPY (EGD) WITH PROPOFOL;  Surgeon: Irene Shipper, MD;  [facility];  Service: Endoscopy;  Laterality: N/A;   HEMOSTASIS CLIP PLACEMENT  09/16/2021   Procedure: HEMOSTASIS CLIP PLACEMENT;  Surgeon: Gatha Mayer, MD;  Location: [place];  Service: Gastroenterology;;   HOT HEMOSTASIS N/A 09/16/2021   Procedure: HOT HEMOSTASIS (ARGON PLASMA COAGULATION/BICAP);  Surgeon: Gatha Mayer, MD;  Location: [place];  Service: Gastroenterology;  Laterality: N/A;   INSERTION OF ILIAC STENT Right 08/20/2020   Procedure: RETROGRADE INSERTION OF RIGHT ILIAC STENT;  Surgeon: Marty Heck, MD;  Location: [place];  Service: Vascular;  Laterality: Right;   INTRAOPERATIVE ARTERIOGRAM Right 08/20/2020   Procedure: INTRA OPERATIVE ARTERIOGRAM ILIAC;  Surgeon: Marty Heck, MD;  Location: [place];  Service: Vascular;  Laterality: Right;   PATCH ANGIOPLASTY Right 08/20/2020   Procedure: PATCH ANGIOPLASTY RIGHT FEMORAL ARTERY;  Surgeon: Marty Heck, MD;  Location: [place];  Service: Vascular;  Laterality: Right;   PERIPHERAL VASCULAR [name] ANGIOPLASTY Right 05/30/2021   Procedure: PERIPHERAL VASCULAR [name] ANGIOPLASTY;  Surgeon: Marty Heck, MD;  Location: [place] [facility];  Service: Cardiovascular;  Laterality: Right;   PORTACATH PLACEMENT Left 06/13/2016   Procedure: INSERTION PORT-A-CATH;  Surgeon: Aviva Signs, MD;  [facility];  Service: General;  Laterality: Left;   TRANSURETHRAL RESECTION OF PROSTATE N/A 05/31/2020   Procedure: TRANSURETHRAL RESECTION OF THE PROSTATE (TURP);  Surgeon: Cleon Gustin, MD;  Location: [facility];  Service: Urology;  Laterality: N/A;   VIDEO BRONCHOSCOPY WITH ENDOBRONCHIAL NAVIGATION N/A 05/28/2016   Procedure: VIDEO BRONCHOSCOPY WITH ENDOBRONCHIAL NAVIGATION;  Surgeon: [name], MD;  Location: [place];  Service: Thoracic;  Laterality: N/A;   VIDEO BRONCHOSCOPY WITH ENDOBRONCHIAL ULTRASOUND N/A 05/28/2016   Procedure: VIDEO BRONCHOSCOPY WITH ENDOBRONCHIAL ULTRASOUND;  Surgeon: [name], MD;  [facility]: [facility];  Service: Thoracic;  Laterality: N/A;    Current Medications: Current Meds  Medication Sig   acetaminophen (TYLENOL) 650 MG CR tablet Take 1,300 mg by mouth every 8 (eight) hours as needed for pain.   apixaban (ELIQUIS) 5 MG TABS tablet TAKE 1 TABLET BY MOUTH TWICE A DAY (Patient taking differently: Take 5 mg by mouth 2 (two) times daily.)   aspirin EC 81 MG tablet Take 81 mg by mouth daily. Swallow whole.   Blood Glucose Monitoring  Suppl (ONETOUCH VERIO REFLECT) w/Device KIT Use to test blood sugars daily as directed. DX: E11.9   Cyanocobalamin (B-12 PO) Take 1 tablet by mouth daily.   famotidine (PEPCID) 20 MG tablet One after supper   fluticasone (FLONASE) 50 MCG/ACT nasal spray PLACE 1 SPRAY INTO BOTH NOSTRILS DAILY AS NEEDED FOR ALLERGIES OR RHINITIS.   Fluticasone-Umeclidin-Vilant (TRELEGY ELLIPTA) 100-62.5-25 MCG/INH AEPB Inhale 1 puff into the lungs daily.   gabapentin (NEURONTIN) 300 MG capsule TAKE 1 CAPSULE BY MOUTH THREE TIMES A DAY   Iron, Ferrous Sulfate, 325 (65 Fe) MG TABS Take 325 mg by mouth 2 (two) times daily. (Patient taking differently: Take 325 mg by mouth every evening.)   KLOR-CON M20 20 MEQ tablet TAKE 1 TABLET BY MOUTH EVERY DAY (Patient taking differently: Take 20 mEq by mouth daily.)   levalbuterol (XOPENEX HFA) 45 MCG/ACT  inhaler Inhale 2 puffs into the lungs every 6 (six) hours as needed for wheezing or shortness of breath.   levalbuterol (XOPENEX) 0.63 MG/3ML nebulizer solution Take 3 mLs (0.63 mg total) by nebulization every 6 (six) hours as needed for wheezing or shortness of breath.   levothyroxine (SYNTHROID) 50 MCG tablet TAKE 1 TABLET BY MOUTH EVERY DAY (Patient taking differently: Take 50 mcg by mouth daily before breakfast.)   linaclotide (LINZESS) 72 MCG capsule Take 1 capsule (72 mcg total) by mouth daily before breakfast. (Patient taking differently: Take 72 mcg by mouth daily as needed (constipation).)   Melatonin 10 MG TABS Take 10 mg by mouth at bedtime as needed (sleep).   metolazone (ZAROXOLYN) 2.5 MG tablet Take 1 tablet by mouth on Monday, Wednesday and Friday   NON FORMULARY CPAP at bedtime   ondansetron (ZOFRAN) 4 MG tablet Take 1 tablet (4 mg total) by mouth every 8 (eight) hours as needed for nausea or vomiting.   OneTouch Delica Lancets 95M MISC Use to test blood sugars daily as directed. DX: E11.9   ONETOUCH VERIO test strip USE TO TEST BLOOD SUGARS DAILY AS DIRECTED. DX: E11.9   OXYGEN Inhale 2 L into the lungs continuous.   pantoprazole (PROTONIX) 40 MG tablet Take 1 tablet (40 mg total) by mouth daily.   pravastatin (PRAVACHOL) 40 MG tablet TAKE 1 TABLET (40 MG TOTAL) BY MOUTH ON MONDAYS , WEDNESDAYS, AND FRIDAYS AS DIRECTED   Semaglutide (RYBELSUS) 7 MG TABS Take 7 mg by mouth daily.   spironolactone (ALDACTONE) 25 MG tablet Take 1 tablet (25 mg total) by mouth daily.   torsemide (DEMADEX) 20 MG tablet Take 2 tablets (40 mg) in the morning, take 2 tablets (40 mg) in the evening daily as needed. (Patient taking differently: Take 20 mg by mouth See admin instructions. Take 2 tablets (40 mg) in the morning, take 2 tablets (40 mg) in the evening daily as needed for swelling)     Allergies:    Sulfa antibiotics, Atorvastatin, Jardiance [empagliflozin], Lopressor [metoprolol], Rosuvastatin,  Doxycycline, and Temazepam   Social History: Social History   Socioeconomic History   Marital status: Soil scientist    Spouse name: Not on file   Number of children: 5   Years of education: 10   Highest education level: 10th grade  Occupational History   Occupation: Retired  Tobacco Use   Smoking status: Every Day    Packs/day: 0.50    Years: 55.00    Total pack years: 27.50    Types: Cigarettes    Start date: 03/11/1961   Smokeless tobacco: Never   Tobacco  comments:    1/2 pack to 1 pack per day 12/14/19  Vaping Use   Vaping Use: Never used  Substance and Sexual Activity   Alcohol use: No   Drug use: No   Sexual activity: Yes    Birth control/protection: None  Other Topics Concern   Not on file  Social History Narrative   [name] is his POA and lives with him. Children out of town - one in [facility], others in [place].   Social Determinants of Health   Financial Resource Strain: Low Risk  (09/18/2020)   Overall Financial Resource Strain (CARDIA)    Difficulty of Paying Living Expenses: Not hard at all  Food Insecurity: No Food Insecurity (09/18/2020)   Hunger Vital Sign    Worried About Running Out of Food in the Last Year: Never true    Ran Out of Food in the Last Year: Never true  Transportation Needs: No Transportation Needs (09/18/2020)   PRAPARE - Hydrologist (Medical): No    Lack of Transportation (Non-Medical): No  Physical Activity: Unknown (09/18/2020)   Exercise Vital Sign    Days of Exercise per Week: 7 days    Minutes of Exercise per Session: Not on file  Stress: No Stress Concern Present (09/18/2020)   [place]    Feeling of Stress : Not at all  Social Connections: Moderately Isolated (09/18/2020)   Social Connection and Isolation Panel [NHANES]    Frequency of Communication with Friends and Family: More than three times a week    Frequency of Social  Gatherings with Friends and Family: Once a week    Attends Religious Services: Never    Marine scientist or Organizations: No    Attends Music therapist: Never    Marital Status: Living with partner     Family History: The patient's family history includes Diabetes in his father; Heart disease in his father; Hypertension in his mother and sister; Stroke in his father.  ROS:   All other ROS reviewed and negative. Pertinent positives noted in the HPI.    EKGs/Labs/Other Studies Reviewed:   The following studies were personally reviewed by me today:  TTE 09/15/2021  1. Aortic valve calcified with restricted motion but no AS by doppler.   2. Left ventricular ejection fraction, by estimation, is 55 to 60%. The  left ventricle has normal function. The left ventricle has no regional  wall motion abnormalities. Left ventricular diastolic parameters are  indeterminate.   3. Right ventricular systolic function is normal. The right ventricular  size is normal. There is mildly elevated pulmonary artery systolic  pressure.   4. Left atrial size was mildly dilated.   5. The mitral valve is normal in structure. No evidence of mitral valve  regurgitation. No evidence of mitral stenosis. Moderate mitral annular  calcification.   6. The aortic valve is calcified. Aortic valve regurgitation is trivial.  Aortic valve sclerosis/calcification is present, without any evidence of  aortic stenosis.   7. The inferior vena cava is dilated in size with <50% respiratory  variability, suggesting right atrial pressure of 15 mmHg.   Recent Labs: 08/23/2021: TSH 2.510 09/13/2021: B Natriuretic Peptide 267.8 09/16/2021: Magnesium 2.2 09/26/2021: ALT 19; BUN 32; Creatinine, Ser 1.16; Potassium 5.1; Sodium 135 10/01/2021: Hemoglobin 8.3; Platelets 319   Recent Lipid Panel    Component Value Date/Time   CHOL 126 08/21/2020 0530  CHOL 136 04/25/2019 1608   TRIG 89 08/21/2020 0530   HDL 28 (L)  08/21/2020 0530   HDL 28 (L) 04/25/2019 1608   CHOLHDL 4.5 08/21/2020 0530   VLDL 18 08/21/2020 0530   LDLCALC 80 08/21/2020 0530   LDLCALC 78 04/25/2019 1608    Physical Exam:   VS:  BP 126/60   Pulse 76   Ht 5' 6"  (1.676 m)   Wt 162 lb 12.8 oz (73.8 kg)   SpO2 97%   BMI 26.28 kg/m    Wt Readings from Last 3 Encounters:  10/09/21 162 lb 12.8 oz (73.8 kg)  09/26/21 158 lb (71.7 kg)  09/25/21 158 lb (71.7 kg)    General: Well nourished, well developed, in no acute distress Head: Atraumatic, normal size  Eyes: PEERLA, EOMI  Neck: Supple, no JVD Endocrine: No thryomegaly Cardiac: Normal S1, S2; RRR; no murmurs, rubs, or gallops Lungs: Clear to auscultation bilaterally, no wheezing, rhonchi or rales  Abd: Soft, nontender, no hepatomegaly  Ext: No edema, pulses 2+ Musculoskeletal: No deformities, BUE and BLE strength normal and equal Skin: Warm and dry, no rashes   Neuro: Alert and oriented to person, place, time, and situation, CNII-XII grossly intact, no focal deficits  Psych: Normal mood and affect   ASSESSMENT:   SIDDHARTH BABINGTON is a 76 y.o. male who presents for the following: 1. Chronic diastolic CHF (congestive heart failure) ([place])   2. Atrial flutter, unspecified type ([place])   3. PAD (peripheral artery disease) ([place])   4. Hyperlipidemia, unspecified hyperlipidemia type     PLAN:   1. Chronic diastolic CHF (congestive heart failure) (HCC) -Evidence of volume overload today.  Likely would benefit from hospital admission but he would like to try this as an outpatient.  We will add metolazone 2.5 mg Mondays Wednesdays and Fridays.  Continue torsemide 40 mg twice daily.  Add Aldactone 25 mg daily.  He really needs to watch his salt intake.  This is likely the main contributor of his volume overload.  He is not that active.  I have encouraged him to get active as well as to adhere to a salt reduced diet.  He will see me back in 4 to 6 weeks.  If symptoms do not improve in  the next 2 to 3 days he will need to go to the hospital.  2. Atrial flutter, unspecified type ([place]) -History of atrial flutter status post cardioversion.  On Eliquis and aspirin.  He has ongoing anemia.  Recent admission to the hospital with radiation gastritis of the stomach as well as colonic AVM.  Blood counts are trending down.  We will check them today.  If counts continue to trend down would recommend to stop blood thinners.  May need to be considered for a Watchman procedure.  3. PAD (peripheral artery disease) ([place]) -On aspirin.  No symptoms of claudication.  Status post right common iliac stent in 2022.  4. Hyperlipidemia, unspecified hyperlipidemia type -Intolerant of other statins.  Currently on simvastatin.  Most recent LDL acceptable but not quite at goal.  He is not wanting to do other medications.      Disposition: Return in about 1 month (around 11/09/2021).  Medication Adjustments/Labs and Tests Ordered: Current medicines are reviewed at length with the patient today.  Concerns regarding medicines are outlined above.  Orders Placed This Encounter  Procedures   Basic metabolic panel   CBC   Meds ordered this encounter  Medications   metolazone (ZAROXOLYN)  2.5 MG tablet    Sig: Take 1 tablet by mouth on Monday, Wednesday and Friday    Dispense:  45 tablet    Refill:  1   spironolactone (ALDACTONE) 25 MG tablet    Sig: Take 1 tablet (25 mg total) by mouth daily.    Dispense:  90 tablet    Refill:  3    Patient Instructions  Medication Instructions:  START Metolazone 2.5 mg (Monday, Wednesday, Friday)  START Aldactone 25 mg daily   *If you need a refill on your cardiac medications before your next appointment, please call your pharmacy*   Lab Work: BMET, CBC today   If you have labs (blood work) drawn today and your tests are completely normal, you will receive your results only by: [place] (if you have MyChart) OR A paper copy in the mail If you  have any lab test that is abnormal or we need to change your treatment, we will call you to review the results.   Follow-Up: At [facility], you and your health needs are our priority.  As part of our continuing mission to provide you with exceptional heart care, we have created designated Provider Care Teams.  These Care Teams include your primary Cardiologist (physician) and Advanced Practice Providers (APPs -  Physician Assistants and Nurse Practitioners) who all work together to provide you with the care you need, when you need it.  We recommend signing up for the patient portal called "MyChart".  Sign up information is provided on this After Visit Summary.  MyChart is used to connect with patients for Virtual Visits (Telemedicine).  Patients are able to view lab/test results, encounter notes, upcoming appointments, etc.  Non-urgent messages can be sent to your provider as well.   To learn more about what you can do with MyChart, go to [email or web address].    Your next appointment:   4-6 week(s)  The format for your next appointment:   In Person  Provider:   Evalina Field, MD           Time Spent with Patient: I have spent a total of 35 minutes with patient reviewing hospital notes, telemetry, EKGs, labs and examining the patient as well as establishing an assessment and plan that was discussed with the patient.  > 50% of time was spent in direct patient care.  Signed, Addison Naegeli. Audie Box, MD, [place]  [address], [place], [place] [address] [phone]  10/09/2021 9:43 AM

## 2021-10-09 ENCOUNTER — Encounter: Payer: Self-pay | Admitting: Internal Medicine

## 2021-10-09 ENCOUNTER — Telehealth: Payer: Self-pay | Admitting: Family

## 2021-10-09 ENCOUNTER — Ambulatory Visit: Payer: Medicare Other | Admitting: Internal Medicine

## 2021-10-09 ENCOUNTER — Ambulatory Visit (INDEPENDENT_AMBULATORY_CARE_PROVIDER_SITE_OTHER): Payer: Medicare Other | Admitting: Internal Medicine

## 2021-10-09 ENCOUNTER — Ambulatory Visit (INDEPENDENT_AMBULATORY_CARE_PROVIDER_SITE_OTHER): Payer: Medicare Other | Admitting: Cardiovascular Disease

## 2021-10-09 ENCOUNTER — Encounter (HOSPITAL_BASED_OUTPATIENT_CLINIC_OR_DEPARTMENT_OTHER): Payer: Self-pay | Admitting: Cardiovascular Disease

## 2021-10-09 VITALS — BP 126/60 | HR 76 | Ht 66.0 in | Wt 162.8 lb

## 2021-10-09 DIAGNOSIS — Z9989 Dependence on other enabling machines and devices: Secondary | ICD-10-CM | POA: Diagnosis not present

## 2021-10-09 DIAGNOSIS — R21 Rash and other nonspecific skin eruption: Secondary | ICD-10-CM | POA: Diagnosis not present

## 2021-10-09 DIAGNOSIS — G4733 Obstructive sleep apnea (adult) (pediatric): Secondary | ICD-10-CM | POA: Diagnosis not present

## 2021-10-09 DIAGNOSIS — J449 Chronic obstructive pulmonary disease, unspecified: Secondary | ICD-10-CM

## 2021-10-09 DIAGNOSIS — I5032 Chronic diastolic (congestive) heart failure: Secondary | ICD-10-CM

## 2021-10-09 DIAGNOSIS — E785 Hyperlipidemia, unspecified: Secondary | ICD-10-CM | POA: Diagnosis not present

## 2021-10-09 DIAGNOSIS — F17210 Nicotine dependence, cigarettes, uncomplicated: Secondary | ICD-10-CM

## 2021-10-09 DIAGNOSIS — J9611 Chronic respiratory failure with hypoxia: Secondary | ICD-10-CM | POA: Diagnosis not present

## 2021-10-09 DIAGNOSIS — I4892 Unspecified atrial flutter: Secondary | ICD-10-CM

## 2021-10-09 DIAGNOSIS — I739 Peripheral vascular disease, unspecified: Secondary | ICD-10-CM

## 2021-10-09 LAB — BASIC METABOLIC PANEL
BUN/Creatinine Ratio: 24 (ref 10–24)
BUN: 27 mg/dL (ref 8–27)
CO2: 24 mmol/L (ref 20–29)
Calcium: 8.7 mg/dL (ref 8.6–10.2)
Chloride: 93 mmol/L — ABNORMAL LOW (ref 96–106)
Creatinine, Ser: 1.12 mg/dL (ref 0.76–1.27)
Glucose: 208 mg/dL — ABNORMAL HIGH (ref 70–99)
Potassium: 4.8 mmol/L (ref 3.5–5.2)
Sodium: 132 mmol/L — ABNORMAL LOW (ref 134–144)
eGFR: 69 mL/min/{1.73_m2} (ref >59)

## 2021-10-09 LAB — CBC
Hematocrit: 22.4 % — ABNORMAL LOW (ref 37.5–51.0)
Hemoglobin: 7 g/dL — CL (ref 13.0–17.7)
MCH: 36.1 pg — ABNORMAL HIGH (ref 26.6–33.0)
MCHC: 31.3 g/dL — ABNORMAL LOW (ref 31.5–35.7)
MCV: 116 fL — ABNORMAL HIGH (ref 79–97)
NRBC: 1 % — ABNORMAL HIGH (ref 0–0)
Platelets: 329 10*3/uL (ref 150–450)
RBC: 1.94 x10E6/uL — CL (ref 4.14–5.80)
RDW: 16.5 % — ABNORMAL HIGH (ref 11.6–15.4)
WBC: 10.3 10*3/uL (ref 3.4–10.8)

## 2021-10-09 MED ORDER — SPIRONOLACTONE 25 MG PO TABS: 25.0000 mg | ORAL_TABLET | Freq: Every day | ORAL | 3 refills | Status: DC

## 2021-10-09 MED ORDER — METOLAZONE 2.5 MG PO TABS: ORAL_TABLET | ORAL | 1 refills | Status: DC

## 2021-10-09 NOTE — Patient Instructions (Addendum)
Dr Halford Chessman evaluation next available for sleep apnea   For sleep and itching > benadryl 25 mg - 50 mg  at bedtime to see if helps (over the counter)  Plan A = Automatic = Always=    Trelegy 100 each AM  Plan B = Backup (to supplement plan A, not to replace it) Only use your levoalbuterol inhaler as a rescue medication to be used if you can't catch your breath by resting or doing a relaxed purse lip breathing pattern.  - The less you use it, the better it will work when you need it. - Ok to use the inhaler up to 2 puffs  every 4 hours if you must but call for appointment if use goes up over your usual need - Don't leave home without it !!  (think of it like the spare tire for your car)   Work on inhaler technique:  relax and gently blow all the way out then take a nice smooth full deep breath back in, triggering the inhaler at same time you start breathing in.  Hold for up to 5 seconds if you can.  Rinse and gargle with water when done.  If mouth or throat bother you at all,  try brushing teeth/gums/tongue with arm and hammer toothpaste/ make a slurry and gargle and spit out.     Make sure you check your oxygen saturation  AT  your highest level of activity (not after you stop)   to be sure it stays over 90% and adjust  02 flow upward to maintain this level if needed but remember to turn it back to previous settings when you stop (to conserve your supply).   Follow up in this clinic is as needed

## 2021-10-09 NOTE — Assessment & Plan Note (Signed)
Onset ? 1st of 2023 ? Related to any specific meds/ very puritic and disturbing sleep  rec try benadyl 25 mg 1-2 hs if tolerates and deferred to PCP whether to refer to PCP          Each maintenance medication was reviewed in detail including emphasizing most importantly the difference between maintenance and prns and under what circumstances the prns are to be triggered using an action plan format where appropriate.  Total time for H and P, chart review, counseling, reviewing hfa/dpi  device(s) and generating customized AVS unique to this office visit / same day charting  > 30 min for multiple  refractory respiratory  symptoms of uncertain etiology

## 2021-10-09 NOTE — Patient Instructions (Signed)
Medication Instructions:  START Metolazone 2.5 mg (Monday, Wednesday, Friday)  START Aldactone 25 mg daily   *If you need a refill on your cardiac medications before your next appointment, please call your pharmacy*   Lab Work: BMET, CBC today   If you have labs (blood work) drawn today and your tests are completely normal, you will receive your results only by: [place] (if you have MyChart) OR A paper copy in the mail If you have any lab test that is abnormal or we need to change your treatment, we will call you to review the results.   Follow-Up: At [facility], you and your health needs are our priority.  As part of our continuing mission to provide you with exceptional heart care, we have created designated Provider Care Teams.  These Care Teams include your primary Cardiologist (physician) and Advanced Practice Providers (APPs -  Physician Assistants and Nurse Practitioners) who all work together to provide you with the care you need, when you need it.  We recommend signing up for the patient portal called "MyChart".  Sign up information is provided on this After Visit Summary.  MyChart is used to connect with patients for Virtual Visits (Telemedicine).  Patients are able to view lab/test results, encounter notes, upcoming appointments, etc.  Non-urgent messages can be sent to your provider as well.   To learn more about what you can do with MyChart, go to [email or web address].    Your next appointment:   4-6 week(s)  The format for your next appointment:   In Person  Provider:   Evalina Field, MD

## 2021-10-09 NOTE — Assessment & Plan Note (Signed)
Active smoker - Spirometry 10/24/2019  FEV1 0.94 (32%)  Ratio 0.48 - 10/24/2019  After extensive coaching inhaler device,  effectiveness =    90% > change breo 200/ incruse to Trelegy 100 q am  - alpha one AT screen  12/14/2019 >>>   MM level 147  - 11/08/2020   Walked on ra x one lap =  approx 250 ft -@ relatively slow  Pace withcane stopped due to leg pain with  min sob / sats 93%  - 09/25/2021   Walked on RA  x  2.5  lap(s) =  approx 400  ft  @ slow pace, stopped due to sob and leg pain  with lowest 02 sats 89%   Group D (now reclassified as E) in terms of symptom/risk and laba/lama/ICS  therefore appropriate rx at this point >>>  trelegy plus approp saba  - The proper method of use, as well as anticipated side effects, of a metered-dose inhaler were discussed and demonstrated to the patient using teach back method. Improved effectiveness after extensive coaching during this visit to a level of approximately 75 % from a baseline of 50 % >>> continue levoalbuterol as back up to trelegy as apparently is sulfate allergic

## 2021-10-09 NOTE — Telephone Encounter (Signed)
LMOVM order for wheelchair needs OV notes for reason this is needed. PCP does not have an opening before the appt he has scheduled on 10/24/21. Will add this to his appt notes.

## 2021-10-09 NOTE — Assessment & Plan Note (Signed)
Referred to Dr Halford Chessman 10/09/2021 >>>   Can see back in pulmonary clinic p sleep eval or defer refills on resp rx to [facility]/ PCP but no need then to see me too.

## 2021-10-09 NOTE — Progress Notes (Signed)
John [name], male    DOB: May 03, 1945,    MRN: [id number]   Brief patient profile:  34 yowm active smoker/MM with onset of sob around 2015 dx as copd at [facility] with GOLD III criteria then dx  RUL lung ca [name] nav bx then  rx chemo/RT summary rx     Non-small cell lung cancer, right ([place]) 1.  Stage IIIa (T1b N2 cM0) NSCLC, favoring adenocarcinoma: -Chemo XRT with weekly carboplatin and paclitaxel completed on 08/20/2016. -Consolidation durvalumab from 09/26/2016 through 09/25/2017.     History of Present Illness  10/24/2019  Pulmonary/ 1st office eval/Antuan Limes on breo 200/ incruse and lisinopril  Chief Complaint  Patient presents with   Consult    Patient has shortness of breath with exertion. When resting he is fine. Productive cough with clear sputum. Had lung cancer and has COPD.   Dyspnea:  Walking x mailbox = 25 ft slt incline  Cough: am mucoid clear  Sleep: flat bed/ 2 pillows/ cpap  SABA use:  q 6h prn  rec Stop lisinopril substitute  benicar 40 mg daily in place of lisinopril  - if blood pressure too low then reduce or stop the amlodipine as it is likely contributing to your fluid in your legs Stop breo and incruse  and substitute Trelogy one click am two good drags hold at least 5 seconds and out thru the nose Only use your albuterol as a rescue medication  The key is to stop smoking completely before smoking completely stops you!    [facility] eval aecopd  09/17/21 Zithromax 250 mg pill >> 2 pills on day 1, then 1 pill daily for next 4 days Prednisone 10 mg pill >> 3 pills daily for 2 days, 2 pills daily for 2 days, 1 pill daily for 2 days Xopenex every 6 hours as needed for cough, wheeze, chest congestion or shortness of breath; you can use this by an inhaler or in your nebulizer  Goal oxygen level should be greater than 90%  Follow up with Dr. Melvyn Novas in [place] office in 1 to 2 weeks    09/25/2021  f/u ov/[facility] office/Corynne Scibilia re: GOLD 3 /Group E maint  on trelegy/ still smoking   Chief Complaint  Patient presents with   Follow-up    Feeling better since visit on 6/13 has finished prednisone and z pak. Still hurting in chest when showering would like another z pak   Dyspnea:  worse in shower / limited by hips/ when walks with lowest RA sats 93% per pt Cough: clear mucus Sleeping: on cpap and 02 2lpm on 2 pillows / bed is flat SABA use: last used 6 h  02: only hs with cpap   Rec Plan A = Automatic = Always=   Continue  Trelegy 100 1st thing in am  Zpak/Prednisone 10 mg take  4 each am x 2 days,   2 each am x 2 days,  1 each am x 2 days and stop  Change Pantoprazole (protonix) 40 mg  to where you  Take  30-60 min before first meal of the day and Pepcid (famotidine)  20 mg after supper until return to office   Plan B = Backup (to supplement plan A, not to replace it) Only use your levobuterol inhaler as a rescue medication Make sure you check your oxygen saturation at your highest level of activity to be sure it stays over 90%  The key is to stop smoking completely before smoking completely  stops you! Please schedule a follow up office visit in 2-3 weeks, call sooner if needed with all medications /inhalers/ solutions in hand    Admit date: 09/13/2021 Discharge date: 09/16/2021  Assessment and Plan: * Acute on chronic heart failure with preserved ejection fraction (HFpEF) (HCC) - Dyspnea likely multifactorial from acute on chronic HFpEF, acute on chronic anemia, and underlying COPD.  Appeared mildly vo  -Treated with IV Lasix with good response and resumed back on home torsemide -Echo performed this admission: EF 55 to 60%, no RWMA, indeterminate diastolic parameters   Acute on chronic anemia Baseline hemoglobin 10-11 g/dL -Patient endorsed dark stools but states he is also on oral iron at home - Eliquis and aspirin initially held -Hemoglobin improved after 1 unit PRBC on 09/14/2021 - Also received INFeD on 09/14/2021 for low iron  stores -Hemoglobin 9.2 g/dL at discharge -No considerable findings on EGD and colonoscopy.  Gastric erythema found on EGD and small angiodysplastic lesion found on colonoscopy and treated with APC - continue PPI - resuming Eliquis on 6/13   Paroxysmal atrial flutter ([place]) Remains in atrial flutter with 4:1 block but otherwise controlled rate.  Continue nebivolol.  -Eliquis resumed at discharge to be restarted on 09/17/2021   COPD (chronic obstructive pulmonary disease) (HCC) - Some wheezing appreciated on exam after admission; treated with IV steroids in the hospital but not continued at discharge - Change albuterol to DuoNeb's - Continue Trelegy   Type 2 diabetes mellitus with hyperglycemia, without long-term current use of insulin ([place]) Well-controlled with last A1c 5.4% 08/25/2021.   -Home regimen resumed at discharge   Mixed diabetic hyperlipidemia associated with type 2 diabetes mellitus ([place]) Continue pravastatin.   Hypertension associated with diabetes ([place]) Continue nebivolol  -Torsemide resumed at discharge   Hypothyroidism Continue Synthroid.   Non-small cell lung cancer, right [facility]) S/p chemotherapy and radiation.  Follows with Dr. Delton Coombes.  CXR shows progressive right posterior perihilar pulmonary opacity, possibly representing postradiation change.  Considered nonemergent contrast-enhanced CT for better assessment. -Appears that patient has outpatient CT chest scheduled for 12/24/2021 already   PAD (peripheral artery disease) (HCC) - Continue statin and aspirin at discharge   Obstructive sleep apnea treated with continuous positive airway pressure (CPAP) Continue CPAP nightly.     Principal Diagnosis: Acute on chronic heart failure with preserved ejection fraction (HFpEF) ([place])   Discharge Diagnoses: Principal Problem:   Acute on chronic heart failure with preserved ejection fraction (HFpEF) (HCC) Active Problems:   Acute on chronic anemia   COPD (chronic  obstructive pulmonary disease) (HCC)   Paroxysmal atrial flutter (HCC)   Type 2 diabetes mellitus with hyperglycemia, without long-term current use of insulin ([place])   Hypertension associated with diabetes ([place])   Mixed diabetic hyperlipidemia associated with type 2 diabetes mellitus (HCC)   Hypothyroidism   Non-small cell lung cancer, right (HCC)   PAD (peripheral artery disease) (HCC)   Obstructive sleep apnea treated with continuous positive airway pressure (CPAP)   GIB (gastrointestinal bleeding)  10/09/2021  f/u ov/[place] office/Syrah Daughtrey re: GOLD 3  maint on trelegy each am   Chief Complaint  Patient presents with   Follow-up    Wants to discuss something for sleep.    Dyspnea:  L > R   hip pain limiting more than breahting  Cough: clear mucus  Sleeping: flat on back / on cpap no f/u planned  SABA use: 4 x daily  02: 4lpm  2 lpm sitting and walking does not check sats or  titrate  Covid status: all of em and infected once    Itchy rash   ext x 6 m > has not tried benadryl yet      No obvious day to day or daytime variability or assoc excess/ purulent sputum or mucus plugs or hemoptysis or cp or chest tightness, subjective wheeze or overt sinus or hb symptoms.    . Also denies any obvious fluctuation of symptoms with weather or environmental changes or other aggravating or alleviating factors except as outlined above   No unusual exposure hx or h/o childhood pna/ asthma or knowledge of premature birth.  Current Allergies, Complete Past Medical History, Past Surgical History, Family History, and Social History were reviewed in [employer] record.  ROS  The following are not active complaints unless bolded Hoarseness, sore throat, dysphagia, dental problems, itching, sneezing,  nasal congestion or discharge of excess mucus or purulent secretions, ear ache,   fever, chills, sweats, unintended wt loss or wt gain, classically pleuritic or exertional cp,   orthopnea pnd or arm/hand swelling  or leg swelling, presyncope, palpitations, abdominal pain, anorexia, nausea, vomiting, diarrhea  or change in bowel habits or change in bladder habits, change in stools or change in urine, dysuria, hematuria,  rash, arthralgias, visual complaints, headache, numbness, weakness or ataxia or problems with walking or coordination,  change in mood or  memory.        Current Meds  Medication Sig   apixaban (ELIQUIS) 5 MG TABS tablet TAKE 1 TABLET BY MOUTH TWICE A DAY (Patient taking differently: Take 5 mg by mouth 2 (two) times daily.)   aspirin EC 81 MG tablet Take 81 mg by mouth daily. Swallow whole.   baclofen (LIORESAL) 10 MG tablet Take 10 mg by mouth as needed for muscle spasms.   Blood Glucose Monitoring Suppl (ONETOUCH VERIO REFLECT) w/Device KIT Use to test blood sugars daily as directed. DX: E11.9   clopidogrel (PLAVIX) 75 MG tablet Take 75 mg by mouth as needed.   Cyanocobalamin (B-12 PO) Take 1 tablet by mouth daily.   ezetimibe-simvastatin (VYTORIN) 10-10 MG tablet Take 1 tablet by mouth as needed.   famotidine (PEPCID) 20 MG tablet One after supper   fluticasone (FLONASE) 50 MCG/ACT nasal spray PLACE 1 SPRAY INTO BOTH NOSTRILS DAILY AS NEEDED FOR ALLERGIES OR RHINITIS.   Fluticasone-Umeclidin-Vilant (TRELEGY ELLIPTA) 100-62.5-25 MCG/INH AEPB Inhale 1 puff into the lungs daily.   gabapentin (NEURONTIN) 300 MG capsule TAKE 1 CAPSULE BY MOUTH THREE TIMES A DAY   Iron, Ferrous Sulfate, 325 (65 Fe) MG TABS Take 325 mg by mouth 2 (two) times daily. (Patient taking differently: Take 325 mg by mouth every evening.)   KLOR-CON M20 20 MEQ tablet TAKE 1 TABLET BY MOUTH EVERY DAY (Patient taking differently: Take 20 mEq by mouth daily.)   levalbuterol (XOPENEX HFA) 45 MCG/ACT inhaler Inhale 2 puffs into the lungs every 6 (six) hours as needed for wheezing or shortness of breath.   levalbuterol (XOPENEX) 0.63 MG/3ML nebulizer solution Take 3 mLs (0.63 mg total) by  nebulization every 6 (six) hours as needed for wheezing or shortness of breath.   levothyroxine (SYNTHROID) 50 MCG tablet TAKE 1 TABLET BY MOUTH EVERY DAY (Patient taking differently: Take 50 mcg by mouth daily before breakfast.)   linaclotide (LINZESS) 72 MCG capsule Take 1 capsule (72 mcg total) by mouth daily before breakfast. (Patient taking differently: Take 72 mcg by mouth daily as needed (constipation).)   Melatonin 10 MG TABS  Take 10 mg by mouth at bedtime as needed (sleep).   metolazone (ZAROXOLYN) 2.5 MG tablet Take 1 tablet by mouth on Monday, Wednesday and Friday   nebivolol (BYSTOLIC) 2.5 MG tablet Take 2.5 mg by mouth as needed.   NON FORMULARY CPAP at bedtime   ondansetron (ZOFRAN) 4 MG tablet Take 1 tablet (4 mg total) by mouth every 8 (eight) hours as needed for nausea or vomiting.   OneTouch Delica Lancets 86V MISC Use to test blood sugars daily as directed. DX: E11.9   ONETOUCH VERIO test strip USE TO TEST BLOOD SUGARS DAILY AS DIRECTED. DX: E11.9   OXYGEN Inhale 2 L into the lungs continuous.   pantoprazole (PROTONIX) 40 MG tablet Take 1 tablet (40 mg total) by mouth daily.   pravastatin (PRAVACHOL) 40 MG tablet TAKE 1 TABLET (40 MG TOTAL) BY MOUTH ON MONDAYS , WEDNESDAYS, AND FRIDAYS AS DIRECTED   Semaglutide (RYBELSUS) 7 MG TABS Take 7 mg by mouth daily.   spironolactone (ALDACTONE) 25 MG tablet Take 1 tablet (25 mg total) by mouth daily.   torsemide (DEMADEX) 20 MG tablet Take 2 tablets (40 mg) in the morning, take 2 tablets (40 mg) in the evening daily as needed. (Patient taking differently: Take 20 mg by mouth See admin instructions. Take 2 tablets (40 mg) in the morning, take 2 tablets (40 mg) in the evening daily as needed for swelling)   zolpidem (AMBIEN) 5 MG tablet Take 5 mg by mouth at bedtime as needed for sleep.              Past Medical History:  Diagnosis Date   Adenocarcinoma of lung, right ([place]) 04/18/2016   Anxiety    Arthritis    Asthma    CHF  (congestive heart failure) (HCC)    COPD (chronic obstructive pulmonary disease) ([place])    Depression    Diabetes mellitus without complication (HCC)    no meds   DM (diabetes mellitus) ([place]) 07/09/2016   Dyspnea    History of kidney stones    Hyperlipidemia    Hypertension    Macular degeneration    Neuropathy    Non-small cell lung cancer, right ([place]) 04/18/2016   Prostatitis    Pulmonary nodule, left 07/16/2016   Sleep apnea    cpap      Objective:    Wts  10/09/2021         162  09/25/2021       158   11/08/2020        151    12/14/19 183 lb 9.6 oz (83.3 kg)  12/07/19 183 lb 3.2 oz (83.1 kg)  11/24/19 183 lb 3.2 oz (83.1 kg)     Vital signs reviewed  10/09/2021  - Note at rest 02 sats  90% on RA    General appearance:    w/c bound pleasantly cantankerous wm nad    HEENT : Oropharynx  clear  Nasal turbinates nl    NECK :  without  apparent JVD/ palpable Nodes/TM    LUNGS: no acc muscle use,  Mild barrel  contour chest wall with bilateral  Distant bs s audible wheeze and  without cough on insp or exp maneuvers  and mild  Hyperresonant  to  percussion bilaterally     CV:  RRR  no s3 or murmur or increase in P2, and  1-2+ ptting edema both LEs  ABD:  soft and nontender with pos end  insp Hoover's  in the  supine position.  No bruits or organomegaly appreciated   MS:  Nl gait/ ext warm without deformities Or obvious joint restrictions  calf tenderness, cyanosis or clubbing     SKIN: warm and dry with MP dry scaly red papular rash both forearms  NEURO:  alert, approp, nl sensorium with  no motor or cerebellar deficits apparent.       I personally reviewed images and agree with radiology impression as follows:  CXR:   pa and lateral 09/13/21  Progressive right posterior perihilar pulmonary opacity, possibly representing post radiation change. This could be better assessed with dedicated nonemergent contrast enhanced CT imaging No radiographic evidence of acute  cardiopulmonary disease.        Assessment

## 2021-10-09 NOTE — Assessment & Plan Note (Signed)
Counseled re importance of smoking cessation but did not meet time criteria for separate billing   °

## 2021-10-09 NOTE — Telephone Encounter (Signed)
Patient seen in office today 07/05

## 2021-10-09 NOTE — Assessment & Plan Note (Signed)
As of 10/09/2021 says using 4lpm hs and 2lpm daytime prn but not titrating per pulse ox   rec Make sure you check your oxygen saturation  AT  your highest level of activity (not after you stop)   to be sure it stays over 90% and adjust  02 flow upward to maintain this level if needed but remember to turn it back to previous settings when you stop (to conserve your supply).

## 2021-10-10 ENCOUNTER — Inpatient Hospital Stay (HOSPITAL_COMMUNITY)
Admission: EM | Admit: 2021-10-10 | Discharge: 2021-10-12 | DRG: 378 | Disposition: A | Discharge status: Home / Self Care | Payer: Medicare Other | Attending: Internal Medicine | Admitting: Internal Medicine

## 2021-10-10 ENCOUNTER — Other Ambulatory Visit: Payer: Self-pay

## 2021-10-10 ENCOUNTER — Emergency Department (HOSPITAL_COMMUNITY): Payer: Medicare Other

## 2021-10-10 ENCOUNTER — Encounter (HOSPITAL_COMMUNITY): Payer: Self-pay

## 2021-10-10 DIAGNOSIS — K31811 Angiodysplasia of stomach and duodenum with bleeding: Principal | ICD-10-CM | POA: Diagnosis present

## 2021-10-10 DIAGNOSIS — K449 Diaphragmatic hernia without obstruction or gangrene: Secondary | ICD-10-CM | POA: Diagnosis present

## 2021-10-10 DIAGNOSIS — K922 Gastrointestinal hemorrhage, unspecified: Secondary | ICD-10-CM | POA: Diagnosis not present

## 2021-10-10 DIAGNOSIS — I739 Peripheral vascular disease, unspecified: Secondary | ICD-10-CM | POA: Diagnosis present

## 2021-10-10 DIAGNOSIS — D649 Anemia, unspecified: Secondary | ICD-10-CM

## 2021-10-10 DIAGNOSIS — K31819 Angiodysplasia of stomach and duodenum without bleeding: Secondary | ICD-10-CM | POA: Diagnosis not present

## 2021-10-10 DIAGNOSIS — Z8711 Personal history of peptic ulcer disease: Secondary | ICD-10-CM | POA: Diagnosis not present

## 2021-10-10 DIAGNOSIS — Z7951 Long term (current) use of inhaled steroids: Secondary | ICD-10-CM

## 2021-10-10 DIAGNOSIS — I4892 Unspecified atrial flutter: Secondary | ICD-10-CM | POA: Diagnosis present

## 2021-10-10 DIAGNOSIS — R42 Dizziness and giddiness: Secondary | ICD-10-CM | POA: Diagnosis not present

## 2021-10-10 DIAGNOSIS — E538 Deficiency of other specified B group vitamins: Secondary | ICD-10-CM | POA: Diagnosis present

## 2021-10-10 DIAGNOSIS — Z85118 Personal history of other malignant neoplasm of bronchus and lung: Secondary | ICD-10-CM | POA: Diagnosis not present

## 2021-10-10 DIAGNOSIS — Z9221 Personal history of antineoplastic chemotherapy: Secondary | ICD-10-CM | POA: Diagnosis not present

## 2021-10-10 DIAGNOSIS — D500 Iron deficiency anemia secondary to blood loss (chronic): Secondary | ICD-10-CM | POA: Diagnosis present

## 2021-10-10 DIAGNOSIS — Z923 Personal history of irradiation: Secondary | ICD-10-CM | POA: Diagnosis not present

## 2021-10-10 DIAGNOSIS — Z79890 Hormone replacement therapy (postmenopausal): Secondary | ICD-10-CM

## 2021-10-10 DIAGNOSIS — I110 Hypertensive heart disease with heart failure: Secondary | ICD-10-CM | POA: Diagnosis present

## 2021-10-10 DIAGNOSIS — K5900 Constipation, unspecified: Secondary | ICD-10-CM | POA: Diagnosis not present

## 2021-10-10 DIAGNOSIS — J449 Chronic obstructive pulmonary disease, unspecified: Secondary | ICD-10-CM | POA: Diagnosis not present

## 2021-10-10 DIAGNOSIS — E119 Type 2 diabetes mellitus without complications: Secondary | ICD-10-CM | POA: Diagnosis present

## 2021-10-10 DIAGNOSIS — Z823 Family history of stroke: Secondary | ICD-10-CM

## 2021-10-10 DIAGNOSIS — I5032 Chronic diastolic (congestive) heart failure: Secondary | ICD-10-CM | POA: Diagnosis present

## 2021-10-10 DIAGNOSIS — Z79899 Other long term (current) drug therapy: Secondary | ICD-10-CM | POA: Diagnosis not present

## 2021-10-10 DIAGNOSIS — E1165 Type 2 diabetes mellitus with hyperglycemia: Secondary | ICD-10-CM | POA: Diagnosis present

## 2021-10-10 DIAGNOSIS — E1151 Type 2 diabetes mellitus with diabetic peripheral angiopathy without gangrene: Secondary | ICD-10-CM | POA: Diagnosis present

## 2021-10-10 DIAGNOSIS — J9611 Chronic respiratory failure with hypoxia: Secondary | ICD-10-CM | POA: Diagnosis not present

## 2021-10-10 DIAGNOSIS — Z7984 Long term (current) use of oral hypoglycemic drugs: Secondary | ICD-10-CM

## 2021-10-10 DIAGNOSIS — R0989 Other specified symptoms and signs involving the circulatory and respiratory systems: Secondary | ICD-10-CM | POA: Diagnosis not present

## 2021-10-10 DIAGNOSIS — R195 Other fecal abnormalities: Secondary | ICD-10-CM | POA: Diagnosis not present

## 2021-10-10 DIAGNOSIS — Z9981 Dependence on supplemental oxygen: Secondary | ICD-10-CM

## 2021-10-10 DIAGNOSIS — F17210 Nicotine dependence, cigarettes, uncomplicated: Secondary | ICD-10-CM | POA: Diagnosis present

## 2021-10-10 DIAGNOSIS — I4891 Unspecified atrial fibrillation: Secondary | ICD-10-CM | POA: Diagnosis present

## 2021-10-10 DIAGNOSIS — E039 Hypothyroidism, unspecified: Secondary | ICD-10-CM | POA: Diagnosis present

## 2021-10-10 DIAGNOSIS — Z833 Family history of diabetes mellitus: Secondary | ICD-10-CM

## 2021-10-10 DIAGNOSIS — Z881 Allergy status to other antibiotic agents status: Secondary | ICD-10-CM

## 2021-10-10 DIAGNOSIS — Z8249 Family history of ischemic heart disease and other diseases of the circulatory system: Secondary | ICD-10-CM

## 2021-10-10 DIAGNOSIS — R5383 Other fatigue: Secondary | ICD-10-CM

## 2021-10-10 DIAGNOSIS — K5909 Other constipation: Secondary | ICD-10-CM | POA: Diagnosis present

## 2021-10-10 DIAGNOSIS — I483 Typical atrial flutter: Secondary | ICD-10-CM

## 2021-10-10 DIAGNOSIS — E785 Hyperlipidemia, unspecified: Secondary | ICD-10-CM | POA: Diagnosis present

## 2021-10-10 DIAGNOSIS — Z888 Allergy status to other drugs, medicaments and biological substances status: Secondary | ICD-10-CM

## 2021-10-10 DIAGNOSIS — I10 Essential (primary) hypertension: Secondary | ICD-10-CM | POA: Diagnosis not present

## 2021-10-10 DIAGNOSIS — D509 Iron deficiency anemia, unspecified: Secondary | ICD-10-CM | POA: Diagnosis not present

## 2021-10-10 DIAGNOSIS — K558 Other vascular disorders of intestine: Secondary | ICD-10-CM | POA: Diagnosis not present

## 2021-10-10 DIAGNOSIS — Z7901 Long term (current) use of anticoagulants: Secondary | ICD-10-CM

## 2021-10-10 DIAGNOSIS — Z9989 Dependence on other enabling machines and devices: Secondary | ICD-10-CM

## 2021-10-10 DIAGNOSIS — C3491 Malignant neoplasm of unspecified part of right bronchus or lung: Secondary | ICD-10-CM | POA: Diagnosis present

## 2021-10-10 DIAGNOSIS — N400 Enlarged prostate without lower urinary tract symptoms: Secondary | ICD-10-CM | POA: Diagnosis present

## 2021-10-10 DIAGNOSIS — Z882 Allergy status to sulfonamides status: Secondary | ICD-10-CM

## 2021-10-10 DIAGNOSIS — R531 Weakness: Secondary | ICD-10-CM | POA: Diagnosis not present

## 2021-10-10 DIAGNOSIS — G4733 Obstructive sleep apnea (adult) (pediatric): Secondary | ICD-10-CM

## 2021-10-10 DIAGNOSIS — K5520 Angiodysplasia of colon without hemorrhage: Secondary | ICD-10-CM | POA: Diagnosis not present

## 2021-10-10 LAB — I-STAT CHEM 8, ED
BUN: 26 mg/dL — ABNORMAL HIGH (ref 8–23)
Calcium, Ion: 1.08 mmol/L — ABNORMAL LOW (ref 1.15–1.40)
Chloride: 90 mmol/L — ABNORMAL LOW (ref 98–111)
Creatinine, Ser: 1.2 mg/dL (ref 0.61–1.24)
Glucose, Bld: 165 mg/dL — ABNORMAL HIGH (ref 70–99)
HCT: 27 % — ABNORMAL LOW (ref 39.0–52.0)
Hemoglobin: 9.2 g/dL — ABNORMAL LOW (ref 13.0–17.0)
Potassium: 3.6 mmol/L (ref 3.5–5.1)
Sodium: 132 mmol/L — ABNORMAL LOW (ref 135–145)
TCO2: 29 mmol/L (ref 22–32)

## 2021-10-10 LAB — COMPREHENSIVE METABOLIC PANEL
ALT: 26 U/L (ref 0–44)
AST: 26 U/L (ref 15–41)
Albumin: 3.2 g/dL — ABNORMAL LOW (ref 3.5–5.0)
Alkaline Phosphatase: 126 U/L (ref 38–126)
Anion gap: 10 (ref 5–15)
BUN: 29 mg/dL — ABNORMAL HIGH (ref 8–23)
CO2: 30 mmol/L (ref 22–32)
Calcium: 8.3 mg/dL — ABNORMAL LOW (ref 8.9–10.3)
Chloride: 93 mmol/L — ABNORMAL LOW (ref 98–111)
Creatinine, Ser: 1.17 mg/dL (ref 0.61–1.24)
GFR, Estimated: >60 mL/min (ref >60)
Glucose, Bld: 190 mg/dL — ABNORMAL HIGH (ref 70–99)
Potassium: 3.6 mmol/L (ref 3.5–5.1)
Sodium: 133 mmol/L — ABNORMAL LOW (ref 135–145)
Total Bilirubin: 1 mg/dL (ref 0.3–1.2)
Total Protein: 6.2 g/dL — ABNORMAL LOW (ref 6.5–8.1)

## 2021-10-10 LAB — BLOOD GAS, ARTERIAL
Acid-Base Excess: 11.4 mmol/L — ABNORMAL HIGH (ref 0.0–2.0)
Bicarbonate: 37.2 mmol/L — ABNORMAL HIGH (ref 20.0–28.0)
Drawn by: 25788
O2 Saturation: 92.7 %
Patient temperature: 36.6
pCO2 arterial: 55 mmHg — ABNORMAL HIGH (ref 32–48)
pH, Arterial: 7.44 (ref 7.35–7.45)
pO2, Arterial: 64 mmHg — ABNORMAL LOW (ref 83–108)

## 2021-10-10 LAB — CBC
HCT: 25.4 % — ABNORMAL LOW (ref 39.0–52.0)
HCT: 27.2 % — ABNORMAL LOW (ref 39.0–52.0)
Hemoglobin: 7.4 g/dL — ABNORMAL LOW (ref 13.0–17.0)
Hemoglobin: 8.1 g/dL — ABNORMAL LOW (ref 13.0–17.0)
MCH: 36.6 pg — ABNORMAL HIGH (ref 26.0–34.0)
MCH: 36.7 pg — ABNORMAL HIGH (ref 26.0–34.0)
MCHC: 29.1 g/dL — ABNORMAL LOW (ref 30.0–36.0)
MCHC: 29.8 g/dL — ABNORMAL LOW (ref 30.0–36.0)
MCV: 125.7 fL — ABNORMAL HIGH (ref 80.0–100.0)
MCV: 123.1 fL — ABNORMAL HIGH (ref 80.0–100.0)
Platelets: 362 10*3/uL (ref 150–400)
Platelets: 312 10*3/uL (ref 150–400)
RBC: 2.02 MIL/uL — ABNORMAL LOW (ref 4.22–5.81)
RBC: 2.21 MIL/uL — ABNORMAL LOW (ref 4.22–5.81)
RDW: 21.5 % — ABNORMAL HIGH (ref 11.5–15.5)
RDW: 22.3 % — ABNORMAL HIGH (ref 11.5–15.5)
WBC: 8.6 10*3/uL (ref 4.0–10.5)
WBC: 7.5 10*3/uL (ref 4.0–10.5)
nRBC: 0.7 % — ABNORMAL HIGH (ref 0.0–0.2)
nRBC: 0.4 % — ABNORMAL HIGH (ref 0.0–0.2)

## 2021-10-10 LAB — AMMONIA: Ammonia: 14 umol/L (ref 9–35)

## 2021-10-10 LAB — PREPARE RBC (CROSSMATCH)

## 2021-10-10 LAB — BRAIN NATRIURETIC PEPTIDE: B Natriuretic Peptide: 191 pg/mL — ABNORMAL HIGH (ref 0.0–100.0)

## 2021-10-10 LAB — POC OCCULT BLOOD, ED: Fecal Occult Bld: POSITIVE — AB

## 2021-10-10 MED ORDER — SODIUM CHLORIDE 0.9 % IV BOLUS
500.0000 mL | Freq: Once | INTRAVENOUS | Status: AC
  Administered 2021-10-10: 500 mL via INTRAVENOUS

## 2021-10-10 MED ORDER — PANTOPRAZOLE SODIUM 40 MG IV SOLR
40.0000 mg | Freq: Two times a day (BID) | INTRAVENOUS | Status: DC
  Administered 2021-10-10 – 2021-10-12 (×3): 40 mg via INTRAVENOUS
  Filled 2021-10-10 (×3): qty 10

## 2021-10-10 MED ORDER — ONDANSETRON HCL 4 MG PO TABS: 4.0000 mg | ORAL_TABLET | Freq: Four times a day (QID) | ORAL | Status: DC | PRN

## 2021-10-10 MED ORDER — ONDANSETRON HCL 4 MG/2ML IJ SOLN
4.0000 mg | Freq: Four times a day (QID) | INTRAMUSCULAR | Status: DC | PRN
  Administered 2021-10-12: 4 mg via INTRAVENOUS
  Filled 2021-10-10: qty 2

## 2021-10-10 MED ORDER — ALBUTEROL SULFATE (2.5 MG/3ML) 0.083% IN NEBU: 2.5000 mg | INHALATION_SOLUTION | Freq: Four times a day (QID) | RESPIRATORY_TRACT | Status: DC | PRN

## 2021-10-10 MED ORDER — ACETAMINOPHEN 650 MG RE SUPP: 650.0000 mg | Freq: Four times a day (QID) | RECTAL | Status: DC | PRN

## 2021-10-10 MED ORDER — UMECLIDINIUM BROMIDE 62.5 MCG/ACT IN AEPB
1.0000 | INHALATION_SPRAY | Freq: Every day | RESPIRATORY_TRACT | Status: DC
  Administered 2021-10-11 – 2021-10-12 (×2): 1 via RESPIRATORY_TRACT
  Filled 2021-10-10: qty 7

## 2021-10-10 MED ORDER — SODIUM CHLORIDE 0.9 % IV BOLUS: 1000.0000 mL | Freq: Once | INTRAVENOUS | Status: DC

## 2021-10-10 MED ORDER — ACETAMINOPHEN 325 MG PO TABS: 650.0000 mg | ORAL_TABLET | Freq: Four times a day (QID) | ORAL | Status: DC | PRN

## 2021-10-10 MED ORDER — ALBUTEROL SULFATE (2.5 MG/3ML) 0.083% IN NEBU
INHALATION_SOLUTION | RESPIRATORY_TRACT | Status: AC
  Administered 2021-10-10: 2.5 mg
  Filled 2021-10-10: qty 3

## 2021-10-10 MED ORDER — PANTOPRAZOLE SODIUM 40 MG IV SOLR
40.0000 mg | Freq: Once | INTRAVENOUS | Status: AC
Start: 2021-10-10 — End: 2021-10-10
  Administered 2021-10-10: 40 mg via INTRAVENOUS
  Filled 2021-10-10: qty 10

## 2021-10-10 MED ORDER — SODIUM CHLORIDE 0.9 % IV SOLN: 10.0000 mL/h | Freq: Once | INTRAVENOUS | Status: DC

## 2021-10-10 MED ORDER — LEVOTHYROXINE SODIUM 50 MCG PO TABS
50.0000 ug | ORAL_TABLET | Freq: Every day | ORAL | Status: DC
  Administered 2021-10-11 – 2021-10-12 (×2): 50 ug via ORAL
  Filled 2021-10-10 (×2): qty 1

## 2021-10-10 MED ORDER — FLUTICASONE FUROATE-VILANTEROL 100-25 MCG/ACT IN AEPB
1.0000 | INHALATION_SPRAY | Freq: Every day | RESPIRATORY_TRACT | Status: DC
  Administered 2021-10-11 – 2021-10-12 (×2): 1 via RESPIRATORY_TRACT
  Filled 2021-10-10: qty 28

## 2021-10-10 MED ORDER — PRAVASTATIN SODIUM 40 MG PO TABS: 40.0000 mg | ORAL_TABLET | ORAL | Status: DC

## 2021-10-10 NOTE — TOC Progression Note (Addendum)
  Transition of Care [facility]) Screening Note   Patient Details  Name: John [name] Date of Birth: 06/09/1945   Transition of Care [facility] CM/SW Contact:    Boneta Lucks, RN Phone Number: 10/10/2021, 3:15 PM  IN OBS - GI following, Active with [facility] RN/PT  Transition of Care Department [facility]) has reviewed patient and no TOC needs have been identified at this time. We will continue to monitor patient advancement through interdisciplinary progression rounds. If new patient transition needs arise, please place a TOC consult.    Expected Discharge Plan: [place] Barriers to Discharge: Continued Medical Work up  Expected Discharge Plan and Services Expected Discharge Plan: [place]

## 2021-10-10 NOTE — ED Provider Notes (Signed)
[facility] Provider Note   CSN: [id number] Arrival date & time: 10/10/21  1056     History  Chief Complaint  Patient presents with   Abnormal Lab    John [name] is a 76 y.o. male.  HPI  Patient with medical history including COPD, hypertension, diabetes, A-fib currently on Eliquis, diastolic heart failure recent EF of 60 to 65% presents with complaints of low hemoglobin.  Patient dates over last couple days he has felt generalized fatigue, he denies any lightheaded or dizziness chest pain or shortness of breath, notes that he has dark tarry stools but this is chronic for him as he is on iron, he denies any frank melena no hematuria no hematemesis or coffee-ground emesis no recent nosebleeds.  He has no stomach pain no nausea or vomiting, he states that he has been constipated his last pain was 2 days ago, states felt slightly nauseous but still passing gas abnormal bowel movements.  I reviewed patient's chart he was admitted last month for similar presentation, patient received a unit of blood, underwent colonoscopy as well as endoscopy significant findings include and she will angioplastic lesion present on bleed at that time.   Home Medications Prior to Admission medications   Medication Sig Start Date End Date Taking? Authorizing Provider  Cyanocobalamin (B-12 PO) Take 1 tablet by mouth daily.   Yes [provider]  famotidine (PEPCID) 20 MG tablet One after supper Patient taking differently: Take 20 mg by mouth daily. 09/25/21  Yes Tanda Rockers, MD  fluticasone (FLONASE) 50 MCG/ACT nasal spray PLACE 1 SPRAY INTO BOTH NOSTRILS DAILY AS NEEDED FOR ALLERGIES OR RHINITIS. 04/17/21  Yes Hawks, Christy A, FNP  Fluticasone-Umeclidin-Vilant (TRELEGY ELLIPTA) 100-62.5-25 MCG/INH AEPB Inhale 1 puff into the lungs daily. 11/08/20  Yes Tanda Rockers, MD  gabapentin (NEURONTIN) 300 MG capsule TAKE 1 CAPSULE BY MOUTH THREE TIMES A DAY Patient taking  differently: Take 300 mg by mouth 3 (three) times daily. 10/01/21  Yes Hawks, Christy A, FNP  Iron, Ferrous Sulfate, 325 (65 Fe) MG TABS Take 325 mg by mouth 2 (two) times daily. Patient taking differently: Take 325 mg by mouth every evening. 04/23/21  Yes Hawks, Christy A, FNP  KLOR-CON M20 20 MEQ tablet TAKE 1 TABLET BY MOUTH EVERY DAY Patient taking differently: Take 20 mEq by mouth daily. 07/16/21  Yes Hawks, Christy A, FNP  levothyroxine (SYNTHROID) 50 MCG tablet TAKE 1 TABLET BY MOUTH EVERY DAY Patient taking differently: Take 50 mcg by mouth daily before breakfast. 07/16/21  Yes Hawks, [name], FNP  linaclotide (LINZESS) 72 MCG capsule Take 1 capsule (72 mcg total) by mouth daily before breakfast. Patient taking differently: Take 72 mcg by mouth daily as needed (constipation). 09/10/21  Yes Hawks, Christy A, FNP  Melatonin 10 MG TABS Take 10 mg by mouth at bedtime as needed (sleep).   Yes [provider]  ondansetron (ZOFRAN) 4 MG tablet Take 1 tablet (4 mg total) by mouth every 8 (eight) hours as needed for nausea or vomiting. 09/03/21  Yes Hawks, Christy A, FNP  OXYGEN Inhale 2 L into the lungs continuous.   Yes [provider]  pantoprazole (PROTONIX) 40 MG tablet Take 1 tablet (40 mg total) by mouth daily. 04/25/21 10/22/21 Yes Derek Jack, MD  pravastatin (PRAVACHOL) 40 MG tablet TAKE 1 TABLET (40 MG TOTAL) BY MOUTH ON MONDAYS , WEDNESDAYS, AND FRIDAYS AS DIRECTED Patient taking differently: Take 40 mg by mouth 3 (three) times  a week. Mon, wed, and fri 10/01/21  Yes Hawks, Christy A, FNP  Semaglutide (RYBELSUS) 7 MG TABS Take 7 mg by mouth daily. 05/24/21  Yes Hawks, Christy A, FNP  spironolactone (ALDACTONE) 25 MG tablet Take 1 tablet (25 mg total) by mouth daily. 10/09/21 10/04/22 Yes O'Neal, Cassie Freer, MD  zolpidem (AMBIEN) 5 MG tablet Take 5 mg by mouth at bedtime as needed for sleep.   Yes [provider]  apixaban (ELIQUIS) 5 MG TABS tablet TAKE 1  TABLET BY MOUTH TWICE A DAY Patient not taking: Reported on 10/10/2021 04/16/21   Geralynn Rile, MD  Blood Glucose Monitoring Suppl (ONETOUCH VERIO REFLECT) w/Device KIT Use to test blood sugars daily as directed. DX: E11.9 10/14/19   [name], FNP  NON FORMULARY CPAP at bedtime    [provider]  OneTouch Delica Lancets 37J MISC Use to test blood sugars daily as directed. DX: E11.9 10/14/19   Hawks, [name], FNP  ONETOUCH VERIO test strip USE TO TEST BLOOD SUGARS DAILY AS DIRECTED. DX: E11.9 12/28/20   Evelina Dun A, FNP  torsemide (DEMADEX) 20 MG tablet Take 2 tablets (40 mg) in the morning, take 2 tablets (40 mg) in the evening daily as needed. Patient not taking: Reported on 10/10/2021 09/06/21   Geralynn Rile, MD      Allergies    Sulfa antibiotics, Atorvastatin, Jardiance [empagliflozin], Lopressor [metoprolol], Rosuvastatin, Doxycycline, and Temazepam    Review of Systems   Review of Systems  Constitutional:  Negative for chills and fever.  Respiratory:  Negative for shortness of breath.   Cardiovascular:  Negative for chest pain.  Gastrointestinal:  Negative for abdominal pain.  Neurological:  Positive for weakness. Negative for headaches.    Physical Exam Updated Vital Signs BP (!) 122/57 Comment: [place] 1 L/M  Pulse 81 Comment: [place] 1 L/M  Temp 97.7 F (36.5 C) (Oral)   Resp 11 Comment: [place] 1 L/M  Ht 5' 6"  (1.676 m)   Wt 72.6 kg   SpO2 100% Comment: [place] 1 L/M  BMI 25.82 kg/m  Physical Exam Vitals and nursing note reviewed. Exam conducted with a chaperone present.  Constitutional:      General: He is not in acute distress.    Appearance: He is not ill-appearing.  HENT:     Head: Normocephalic and atraumatic.     Nose: No congestion.  Eyes:     Conjunctiva/sclera: Conjunctivae normal.     Comments: Patient noted pale conjunctive   Cardiovascular:     Rate and Rhythm: Normal rate and regular rhythm.     Pulses: Normal pulses.     Heart sounds:  No murmur heard.    No friction rub. No gallop.  Pulmonary:     Effort: No respiratory distress.     Breath sounds: No wheezing, rhonchi or rales.  Abdominal:     Palpations: Abdomen is soft.     Tenderness: There is no abdominal tenderness. There is no right CVA tenderness or left CVA tenderness.  Genitourinary:    Comments: With chaperone present rectal exam performed he has a noted external hemorrhoid in the 12 o'clock position nonthrombosed nonerythematous, digital rectal exam was performed he has some stool noted in the rectal vault, he has frank melena without hematochezia Hemoccult was positive Musculoskeletal:     Right lower leg: Edema present.     Left lower leg: Edema present.     Comments: 1+ edema noted up to the shins  bilaterally  Skin:    General: Skin is warm and dry.  Neurological:     Mental Status: He is alert.  Psychiatric:        Mood and Affect: Mood normal.     ED Results / Procedures / Treatments   Labs (all labs ordered are listed, but only abnormal results are displayed) Labs Reviewed  COMPREHENSIVE METABOLIC PANEL - Abnormal; Notable for the following components:      Result Value   Sodium 133 (*)    Chloride 93 (*)    Glucose, Bld 190 (*)    BUN 29 (*)    Calcium 8.3 (*)    Total Protein 6.2 (*)    Albumin 3.2 (*)    All other components within normal limits  CBC - Abnormal; Notable for the following components:   RBC 2.02 (*)    Hemoglobin 7.4 (*)    HCT 25.4 (*)    MCV 125.7 (*)    MCH 36.6 (*)    MCHC 29.1 (*)    RDW 21.5 (*)    nRBC 0.7 (*)    All other components within normal limits  POC OCCULT BLOOD, ED - Abnormal; Notable for the following components:   Fecal Occult Bld POSITIVE (*)    All other components within normal limits  I-STAT CHEM 8, ED - Abnormal; Notable for the following components:   Sodium 132 (*)    Chloride 90 (*)    BUN 26 (*)    Glucose, Bld 165 (*)    Calcium, Ion 1.08 (*)    Hemoglobin 9.2 (*)    HCT 27.0  (*)    All other components within normal limits  BRAIN NATRIURETIC PEPTIDE  TYPE AND SCREEN  PREPARE RBC (CROSSMATCH)    EKG EKG Interpretation  Date/Time:  Thursday October 10 2021 13:06:37 EDT Ventricular Rate:  80 PR Interval:  311 QRS Duration: 161 QT Interval:  467 QTC Calculation: 539 R Axis:   266 Text Interpretation: Sinus or ectopic atrial rhythm Prolonged PR interval Consider dextrocardia Confirmed by Milton Ferguson [phone]) on 10/10/2021 1:58:50 PM  Radiology DG Chest Portable 1 View  Result Date: 10/10/2021 CLINICAL DATA:  Rales, weakness with dizziness EXAM: PORTABLE CHEST 1 VIEW COMPARISON:  Radiograph 09/13/2021, chest CT 06/19/2021 FINDINGS: Chest port catheter tip overlies the mid superior vena cava. Unchanged cardiomediastinal silhouette. There are post treatment changes in the right perihilar lung and left upper lung. Increased prominence of the interstitium. There is no new airspace consolidation. No pleural effusion. No pneumothorax. No acute osseous abnormality. Bilateral shoulder degenerative changes. Thoracic spondylosis. IMPRESSION: Post treatment changes in the right perihilar lung and left upper lung. Increased prominence of the interstitium bilaterally, could reflect interstitial edema or atypical infectious/inflammatory process. Electronically Signed   By: Maurine Simmering M.D.   On: 10/10/2021 12:52    Procedures .Critical Care  Performed by: Marcello Fennel, PA-C Authorized by: Marcello Fennel, PA-C   Critical care provider statement:    Critical care time (minutes):  30   Critical care time was exclusive of:  Separately billable procedures and treating other patients   Critical care was necessary to treat or prevent imminent or life-threatening deterioration of the following conditions:  Circulatory failure   Critical care was time spent personally by me on the following activities:  Development of treatment plan with patient or surrogate, discussions  with consultants, evaluation of patient's response to treatment, examination of patient, ordering and review of laboratory studies,  ordering and performing treatments and interventions, pulse oximetry, re-evaluation of patient's condition and review of old charts   I assumed direction of critical care for this patient from another provider in my specialty: no     Care discussed with: admitting provider       Medications Ordered in ED Medications  0.9 %  sodium chloride infusion (10 mL/hr Intravenous Not Given 10/10/21 1217)  pantoprazole (PROTONIX) injection 40 mg (40 mg Intravenous Given 10/10/21 1216)    ED Course/ Medical Decision Making/ A&P                           Medical Decision Making Amount and/or Complexity of Data Reviewed Labs: ordered. Radiology: ordered.  Risk Prescription drug management. Decision regarding hospitalization.   This patient presents to the ED for concern of low hemoglobin, this involves an extensive number of treatment options, and is a complaint that carries with it a high risk of complications and morbidity.  The differential diagnosis includes GI bleed, constipation, diverticulitis, bowel obstruction    Additional history obtained:  Additional history obtained from wife at bedside External records from outside source obtained and reviewed including cardiology notes, previous discharge summary   Co morbidities that complicate the patient evaluation  Anticoagulated, COPD, CHF  Social Determinants of Health:  Geriatric    Lab Tests:  I Ordered, and personally interpreted labs.  The pertinent results include: CBC shows Anemia hemoglobin 7.4, CMP shows sodium 133 chloride 93 glucose 190 BUN 29 calcium 8.3 protein 6.2 albumin 3.2, Hemoccult positive   Imaging Studies ordered:  I ordered imaging studies including chest x-ray I independently visualized and interpreted imaging which showed interstitial prominence infectious versus edema I  agree with the radiologist interpretation   Cardiac Monitoring:  The patient was maintained on a cardiac monitor.  I personally viewed and interpreted the cardiac monitored which showed an underlying rhythm of: Without signs of ischemia   Medicines ordered and prescription drug management:  I ordered medication including Protonix I have reviewed the patients home medicines and have made adjustments as needed  Critical Interventions:  Active GI bleed with symptomatic anemia we will provide him with 1 unit of blood   Reevaluation:  Presents with low hemoglobin, on my exam patient was pale, he had a positive Hemoccult, will obtain lab work and reassess.  patient has hemoglobin of 7.4, with positive orthostatics, will consult with GI for further recommendations.  Updated patient recommendations from GI he is agreement this plan will consult hospitalist for admission.  Reassessed he is resting comfortably, undergoing blood transfusion without difficulty, per nursing staff patient became slightly hypoxic was placed on 1 L via nasal cannula at 100% showing no evidence of respiratory distress.   Consultations Obtained:  I requested consultation with the gastroenterology Dr. Jenetta Downer,  and discussed lab and imaging findings as well as pertinent plan - they recommend: Recommends medical admission, n.p.o. by midnight tonight for EGD tomorrow, would recommend a unit of blood and Lasix as needed    Test Considered:  N/A    Rule out I have low suspicion for ACS is denies any chest pain shortness of breath, EKG without signs of ischemia. I have low suspicion for CHF exacerbation as it does not appear to be significant volume overloaded,  lung sounds are clear bilaterally, chest x-ray shows no new pleural effusions. I have low suspicion for bowel obstruction or volvulus as abdomen soft nontender, nondistended, he still passing  gas, he does note that he is constipated but I suspect is  likely secondary due iron supplements.    Dispostion and problem list  After consideration of the diagnostic results and the patients response to treatment, I feel that the patent would benefit from admission.  Upper GI bleeding-likely cause of patient's somatic anemia, patient will receive unit of blood, he will go n.p.o. by midnight tonight and have formal consultation by GI in the morning.            Final Clinical Impression(s) / ED Diagnoses Final diagnoses:  Symptomatic anemia  Upper GI bleed    Rx / DC Orders ED Discharge Orders     None         Marcello Fennel, PA-C 10/10/21 1359    Milton Ferguson, MD 10/11/21 1701

## 2021-10-10 NOTE — ED Triage Notes (Signed)
Pt had labs drawn for a routine check yesterday and he was found to have an Hgb of 7. Pt states he is on iron so his stool is always dark. Pt reports associated fatigue and lightheaded feelings.

## 2021-10-10 NOTE — Consult Note (Signed)
Gastroenterology Consult   Referring Provider: No ref. provider found Primary Care Physician:  [name], FNP Primary Gastroenterologist:  Dr.John Henrene Pastor  Patient ID: John [name]; [id number]; 07/13/1945   Admit date: 10/10/2021  LOS: 0 days   Date of Consultation: 10/10/2021  Reason for Consultation:  anemia, heme + stool    History of Present Illness   John [name] is a 76 y.o. male with multiple significant medical problems including non-small cell lung cancer (chemo/radiation 2018, additional radiation in June 2022), HFpEF (ejection fraction on last echo 60 to 65%), COPD, obstructive sleep apnea, chronic hypoxic respiratory failure with as needed oxygen use, diabetes mellitus, hypothyroidism, PAD with femoral endarterectomy and common iliac stenting, atrial flutter on Eliquis, gastric ulcer, history of GI bleeding presenting to the ED because of low hemoglobin found on outpatient labs.  Seen by both pulmonology and cardiology yesterday. With concern for volume overload, encouraged hospitalization but patient preferred to try as an outpatient.  Metolazone 2.5 mg Monday, Wednesday, Fridays was added. Aldactone 25 mg daily added.  Recommended salt reduced diet.  If no improvement over the next 2 to 3 days then go to the ED. Patient was called this morning with report of low hemoglobin advised to go to the emergency department.  Patient hospitalized a few weeks ago for acute on chronic anemia, required EGD and colonoscopy as outlined below.  Evidence of radiation effect to the esophagus erythema in the stomach but nothing to explain blood loss.  Single nonbleeding colonic AVM was treated with APC and clips.  Hemoglobin at time of discharge was 9.2 on June 12.  June 22 hemoglobin down to 8.4, June 27 hemoglobin 8.3, yesterday hemoglobin 7.0.  Today's hemoglobin is 7.4, MCV 125.7, stool heme positive.  Patient denies overt GI bleeding.  Stools are always dark on iron.  Has felt  increased fatigue, increased lower extremity edema but otherwise denies concern regarding appetite, heartburn, dysphagia, nausea/vomiting, abdominal pain.  Chronically constipated, uses Linzess daily as needed.  Seen by Dr. Scarlette Shorts in January 2023 for hospital follow-up and positive Cologuard testing.  In June 2022 he was in the hospital with IDA/heme positive stool.  EGD at that time showed 2 tiny gastric ulcers without bleeding.  Placed on iron supplements.  Diagnosed with B12 deficiency since then.  EGD September 16, 2021: - Increased vascular pattern mucosa in the esophagus. This is c/w radiation effect (had in past for lung Ca) - Erythematous mucosa in the cardia, gastric fundus and gastric body. Biopsied. Do not think cause of bleeding/blood loss - Z-line irregular. - The examination was otherwise normal. - See the other procedure note for documentation of additional recommendations. (colonoscopy to ablate cecal AVM) -Gastric biopsy with mild oxyntic glandular dilatation, consistent with PPI effect, no H. pylori   Colonoscopy September 16, 2021: -A single non-bleeding colonic angiodysplastic lesion. Treated with argon plasma coagulation (APC). Clips were placed to reduce risk of  post-procedure bleeding given need for Eliquis - Diverticulosis in the sigmoid colon, in the descending colon and in the transverse colon. - The examination was otherwise normal on direct and retroflexion views. - No specimens collected.  Colonoscopy March 2023: - A single non-bleeding colonic angiodysplastic lesion. This can cause Hemoccult positive stool/positive Cologuard. - Diverticulosis in the entire examined colon. - The examination was otherwise normal on direct and retroflexion views. - No specimens collected.  EGD June 2022: -Tiny gastric ulcer without bleeding or stigmata. -Otherwise normal EGD -Iron deficiency anemia/Blood  loss this year mostly postoperative in nature (urologic surgery in February,  vascular surgery in May. -Large hematoma on CT accounting for the majority of interval anemia (as opposed to GI -No intracolonic lesions on CT.   Prior to Admission medications   Medication Sig Start Date End Date Taking? Authorizing Provider  apixaban (ELIQUIS) 5 MG TABS tablet TAKE 1 TABLET BY MOUTH TWICE A DAY Patient taking differently: Take 5 mg by mouth 2 (two) times daily. 04/16/21   O'NealCassie Freer, MD  aspirin EC 81 MG tablet Take 81 mg by mouth daily. Swallow whole.    [provider]  baclofen (LIORESAL) 10 MG tablet Take 10 mg by mouth as needed for muscle spasms.    [provider]  Blood Glucose Monitoring Suppl (ONETOUCH VERIO REFLECT) w/Device KIT Use to test blood sugars daily as directed. DX: E11.9 10/14/19   Evelina Dun A, FNP  clopidogrel (PLAVIX) 75 MG tablet Take 75 mg by mouth as needed.    [provider]  Cyanocobalamin (B-12 PO) Take 1 tablet by mouth daily.    [provider]  ezetimibe-simvastatin (VYTORIN) 10-10 MG tablet Take 1 tablet by mouth as needed.    [provider]  famotidine (PEPCID) 20 MG tablet One after supper 09/25/21   Tanda Rockers, MD  fluticasone (FLONASE) 50 MCG/ACT nasal spray PLACE 1 SPRAY INTO BOTH NOSTRILS DAILY AS NEEDED FOR ALLERGIES OR RHINITIS. 04/17/21   Evelina Dun A, FNP  Fluticasone-Umeclidin-Vilant (TRELEGY ELLIPTA) 100-62.5-25 MCG/INH AEPB Inhale 1 puff into the lungs daily. 11/08/20   Tanda Rockers, MD  gabapentin (NEURONTIN) 300 MG capsule TAKE 1 CAPSULE BY MOUTH THREE TIMES A DAY 10/01/21   Hawks, Christy A, FNP  Iron, Ferrous Sulfate, 325 (65 Fe) MG TABS Take 325 mg by mouth 2 (two) times daily. Patient taking differently: Take 325 mg by mouth every evening. 04/23/21   Hawks, Christy A, FNP  KLOR-CON M20 20 MEQ tablet TAKE 1 TABLET BY MOUTH EVERY DAY Patient taking differently: Take 20 mEq by mouth daily. 07/16/21   [name], FNP  levalbuterol [facility] HFA) 45 MCG/ACT  inhaler Inhale 2 puffs into the lungs every 6 (six) hours as needed for wheezing or shortness of breath. 09/17/21   Chesley Mires, MD  levalbuterol Penne Lash) 0.63 MG/3ML nebulizer solution Take 3 mLs (0.63 mg total) by nebulization every 6 (six) hours as needed for wheezing or shortness of breath. 09/17/21   Chesley Mires, MD  levothyroxine (SYNTHROID) 50 MCG tablet TAKE 1 TABLET BY MOUTH EVERY DAY Patient taking differently: Take 50 mcg by mouth daily before breakfast. 07/16/21   [name], FNP  linaclotide (LINZESS) 72 MCG capsule Take 1 capsule (72 mcg total) by mouth daily before breakfast. Patient taking differently: Take 72 mcg by mouth daily as needed (constipation). 09/10/21   Evelina Dun A, FNP  Melatonin 10 MG TABS Take 10 mg by mouth at bedtime as needed (sleep).    [provider]  metolazone (ZAROXOLYN) 2.5 MG tablet Take 1 tablet by mouth on Monday, Wednesday and Friday 10/09/21   Geralynn Rile, MD  nebivolol (BYSTOLIC) 2.5 MG tablet Take 2.5 mg by mouth as needed.    [provider]  NON FORMULARY CPAP at bedtime    [provider]  ondansetron (ZOFRAN) 4 MG tablet Take 1 tablet (4 mg total) by mouth every 8 (eight) hours as needed for nausea or vomiting. 09/03/21   [name], FNP  OneTouch  Delica Lancets 15V MISC Use to test blood sugars daily as directed. DX: E11.9 10/14/19   Hawks, [name], FNP  ONETOUCH VERIO test strip USE TO TEST BLOOD SUGARS DAILY AS DIRECTED. DX: E11.9 12/28/20   Evelina Dun A, FNP  OXYGEN Inhale 2 L into the lungs continuous.    [provider]  pantoprazole (PROTONIX) 40 MG tablet Take 1 tablet (40 mg total) by mouth daily. 04/25/21 10/22/21  Derek Jack, MD  pravastatin (PRAVACHOL) 40 MG tablet TAKE 1 TABLET (40 MG TOTAL) BY MOUTH ON MONDAYS , WEDNESDAYS, AND FRIDAYS AS DIRECTED 10/01/21   Hawks, Christy A, FNP  Semaglutide (RYBELSUS) 7 MG TABS Take 7 mg by mouth daily. 05/24/21   Evelina Dun A,  FNP  spironolactone (ALDACTONE) 25 MG tablet Take 1 tablet (25 mg total) by mouth daily. 10/09/21 10/04/22  Geralynn Rile, MD  torsemide (DEMADEX) 20 MG tablet Take 2 tablets (40 mg) in the morning, take 2 tablets (40 mg) in the evening daily as needed. Patient taking differently: Take 20 mg by mouth See admin instructions. Take 2 tablets (40 mg) in the morning, take 2 tablets (40 mg) in the evening daily as needed for swelling 09/06/21   O'Neal, Cassie Freer, MD  zolpidem (AMBIEN) 5 MG tablet Take 5 mg by mouth at bedtime as needed for sleep.    [provider]    Current Facility-Administered Medications  Medication Dose Route Frequency Provider Last Rate Last Admin   0.9 %  sodium chloride infusion  10 mL/hr Intravenous Once Marcello Fennel, PA-C       Current Outpatient Medications  Medication Sig Dispense Refill   apixaban (ELIQUIS) 5 MG TABS tablet TAKE 1 TABLET BY MOUTH TWICE A DAY (Patient taking differently: Take 5 mg by mouth 2 (two) times daily.) 60 tablet 5   aspirin EC 81 MG tablet Take 81 mg by mouth daily. Swallow whole.     baclofen (LIORESAL) 10 MG tablet Take 10 mg by mouth as needed for muscle spasms.     Blood Glucose Monitoring Suppl (ONETOUCH VERIO REFLECT) w/Device KIT Use to test blood sugars daily as directed. DX: E11.9 1 kit 1   clopidogrel (PLAVIX) 75 MG tablet Take 75 mg by mouth as needed.     Cyanocobalamin (B-12 PO) Take 1 tablet by mouth daily.     ezetimibe-simvastatin (VYTORIN) 10-10 MG tablet Take 1 tablet by mouth as needed.     famotidine (PEPCID) 20 MG tablet One after supper 30 tablet 11   fluticasone (FLONASE) 50 MCG/ACT nasal spray PLACE 1 SPRAY INTO BOTH NOSTRILS DAILY AS NEEDED FOR ALLERGIES OR RHINITIS. 48 mL 3   Fluticasone-Umeclidin-Vilant (TRELEGY ELLIPTA) 100-62.5-25 MCG/INH AEPB Inhale 1 puff into the lungs daily. 1 each 11   gabapentin (NEURONTIN) 300 MG capsule TAKE 1 CAPSULE BY MOUTH THREE TIMES A DAY 270 capsule 0   Iron,  Ferrous Sulfate, 325 (65 Fe) MG TABS Take 325 mg by mouth 2 (two) times daily. (Patient taking differently: Take 325 mg by mouth every evening.) 180 tablet 1   KLOR-CON M20 20 MEQ tablet TAKE 1 TABLET BY MOUTH EVERY DAY (Patient taking differently: Take 20 mEq by mouth daily.) 90 tablet 0   levalbuterol (XOPENEX HFA) 45 MCG/ACT inhaler Inhale 2 puffs into the lungs every 6 (six) hours as needed for wheezing or shortness of breath. 1 each 12   levalbuterol (XOPENEX) 0.63 MG/3ML nebulizer solution Take 3 mLs (0.63 mg total) by nebulization every 6 (  six) hours as needed for wheezing or shortness of breath. 3 mL 12   levothyroxine (SYNTHROID) 50 MCG tablet TAKE 1 TABLET BY MOUTH EVERY DAY (Patient taking differently: Take 50 mcg by mouth daily before breakfast.) 90 tablet 1   linaclotide (LINZESS) 72 MCG capsule Take 1 capsule (72 mcg total) by mouth daily before breakfast. (Patient taking differently: Take 72 mcg by mouth daily as needed (constipation).) 90 capsule 1   Melatonin 10 MG TABS Take 10 mg by mouth at bedtime as needed (sleep).     metolazone (ZAROXOLYN) 2.5 MG tablet Take 1 tablet by mouth on Monday, Wednesday and Friday 45 tablet 1   nebivolol (BYSTOLIC) 2.5 MG tablet Take 2.5 mg by mouth as needed.     NON FORMULARY CPAP at bedtime     ondansetron (ZOFRAN) 4 MG tablet Take 1 tablet (4 mg total) by mouth every 8 (eight) hours as needed for nausea or vomiting. 60 tablet 0   OneTouch Delica Lancets 78H MISC Use to test blood sugars daily as directed. DX: E11.9 100 each 1   ONETOUCH VERIO test strip USE TO TEST BLOOD SUGARS DAILY AS DIRECTED. DX: E11.9 100 strip 12   OXYGEN Inhale 2 L into the lungs continuous.     pantoprazole (PROTONIX) 40 MG tablet Take 1 tablet (40 mg total) by mouth daily. 90 tablet 3   pravastatin (PRAVACHOL) 40 MG tablet TAKE 1 TABLET (40 MG TOTAL) BY MOUTH ON MONDAYS , WEDNESDAYS, AND FRIDAYS AS DIRECTED 48 tablet 0   Semaglutide (RYBELSUS) 7 MG TABS Take 7 mg by mouth  daily. 90 tablet 1   spironolactone (ALDACTONE) 25 MG tablet Take 1 tablet (25 mg total) by mouth daily. 90 tablet 3   torsemide (DEMADEX) 20 MG tablet Take 2 tablets (40 mg) in the morning, take 2 tablets (40 mg) in the evening daily as needed. (Patient taking differently: Take 20 mg by mouth See admin instructions. Take 2 tablets (40 mg) in the morning, take 2 tablets (40 mg) in the evening daily as needed for swelling) 270 tablet 0   zolpidem (AMBIEN) 5 MG tablet Take 5 mg by mouth at bedtime as needed for sleep.      Allergies as of 10/10/2021 - Review Complete 10/10/2021  Allergen Reaction Noted   Sulfa antibiotics Swelling 06/18/2020   Atorvastatin Other (See Comments) 08/21/2020   Jardiance [empagliflozin] Other (See Comments) 09/23/2019   Lopressor [metoprolol] Other (See Comments) 09/21/2019   Rosuvastatin Other (See Comments) 08/21/2020   Doxycycline Nausea Only 08/23/2021   Temazepam Other (See Comments) 10/04/2020    Past Medical History:  Diagnosis Date   Adenocarcinoma of lung, right ([place] [facility]) 04/18/2016   Anxiety    Arthritis    Asthma    Atrial flutter (HCC)    BPH (benign prostatic hyperplasia)    with urinary retention 02/06/20   CHF (congestive heart failure) (HCC)    COPD (chronic obstructive pulmonary disease) ([place])    Depression    Diabetes mellitus, type II ([place])    Dyspnea    History of kidney stones    History of radiation therapy    right lung 09/10/2020-09/17/2020   Dr Sondra Come   Hyperlipidemia    Hypertension    Hypothyroidism    Macular degeneration    Neuropathy    Non-small cell lung cancer, right ([place]) 04/18/2016   Peripheral vascular disease ([place])    Prostatitis    Pulmonary nodule, left 07/16/2016   Sleep apnea  cpap    Past Surgical History:  Procedure Laterality Date   ABDOMINAL AORTOGRAM W/LOWER EXTREMITY Left 02/06/2020   Procedure: ABDOMINAL AORTOGRAM W/LOWER EXTREMITY;  Surgeon: Lorretta Harp, MD;  Location: [place] [facility];   Service: Cardiovascular;  Laterality: Left;   ABDOMINAL AORTOGRAM W/LOWER EXTREMITY N/A 05/30/2021   Procedure: ABDOMINAL AORTOGRAM W/LOWER EXTREMITY;  Surgeon: Marty Heck, MD;  Location: [place] [facility];  Service: Cardiovascular;  Laterality: N/A;   BIOPSY  09/16/2021   Procedure: BIOPSY;  Surgeon: Gatha Mayer, MD;  Location: [place];  Service: Gastroenterology;;   CARDIOVERSION N/A 10/05/2020   Procedure: CARDIOVERSION;  Surgeon: Geralynn Rile, MD;  Location: [place];  Service: Cardiovascular;  Laterality: N/A;   CATARACT EXTRACTION, BILATERAL Bilateral    COLONOSCOPY WITH PROPOFOL N/A 06/20/2021   Procedure: COLONOSCOPY WITH PROPOFOL;  Surgeon: Irene Shipper, MD;  Location: [facility];  Service: Endoscopy;  Laterality: N/A;   COLONOSCOPY WITH PROPOFOL N/A 09/16/2021   Procedure: COLONOSCOPY WITH PROPOFOL;  Surgeon: Gatha Mayer, MD;  Location: [place];  Service: Gastroenterology;  Laterality: N/A;   ENDARTERECTOMY FEMORAL Right 08/20/2020   Procedure: ENDARTERECTOMY  RIGHT FEMORAL ARTERY;  Surgeon: Marty Heck, MD;  Location: [place];  Service: Vascular;  Laterality: Right;   ESOPHAGOGASTRODUODENOSCOPY N/A 09/16/2021   Procedure: ESOPHAGOGASTRODUODENOSCOPY (EGD);  Surgeon: Gatha Mayer, MD;  Location: [place];  Service: Gastroenterology;  Laterality: N/A;   ESOPHAGOGASTRODUODENOSCOPY (EGD) WITH PROPOFOL N/A 09/06/2020   Procedure: ESOPHAGOGASTRODUODENOSCOPY (EGD) WITH PROPOFOL;  Surgeon: Irene Shipper, MD;  Location: [facility];  Service: Endoscopy;  Laterality: N/A;   HEMOSTASIS CLIP PLACEMENT  09/16/2021   Procedure: HEMOSTASIS CLIP PLACEMENT;  Surgeon: Gatha Mayer, MD;  Location: [place];  Service: Gastroenterology;;   HOT HEMOSTASIS N/A 09/16/2021   Procedure: HOT HEMOSTASIS (ARGON PLASMA COAGULATION/BICAP);  Surgeon: Gatha Mayer, MD;  Location: [place];  Service: Gastroenterology;  Laterality: N/A;   INSERTION OF ILIAC  STENT Right 08/20/2020   Procedure: RETROGRADE INSERTION OF RIGHT ILIAC STENT;  Surgeon: Marty Heck, MD;  Location: [place];  Service: Vascular;  Laterality: Right;   INTRAOPERATIVE ARTERIOGRAM Right 08/20/2020   Procedure: INTRA OPERATIVE ARTERIOGRAM ILIAC;  Surgeon: Marty Heck, MD;  Location: [place];  Service: Vascular;  Laterality: Right;   PATCH ANGIOPLASTY Right 08/20/2020   Procedure: PATCH ANGIOPLASTY RIGHT FEMORAL ARTERY;  Surgeon: Marty Heck, MD;  Location: [place];  Service: Vascular;  Laterality: Right;   PERIPHERAL VASCULAR BALLOON ANGIOPLASTY Right 05/30/2021   Procedure: PERIPHERAL VASCULAR BALLOON ANGIOPLASTY;  Surgeon: Marty Heck, MD;  Location: [place] [facility];  Service: Cardiovascular;  Laterality: Right;   PORTACATH PLACEMENT Left 06/13/2016   Procedure: INSERTION PORT-A-CATH;  Surgeon: Aviva Signs, MD;  Location: [facility];  Service: General;  Laterality: Left;   TRANSURETHRAL RESECTION OF PROSTATE N/A 05/31/2020   Procedure: TRANSURETHRAL RESECTION OF THE PROSTATE (TURP);  Surgeon: Cleon Gustin, MD;  Location: [facility];  Service: Urology;  Laterality: N/A;   VIDEO BRONCHOSCOPY WITH ENDOBRONCHIAL NAVIGATION N/A 05/28/2016   Procedure: VIDEO BRONCHOSCOPY WITH ENDOBRONCHIAL NAVIGATION;  Surgeon: [name], MD;  Location: [place];  Service: Thoracic;  Laterality: N/A;   VIDEO BRONCHOSCOPY WITH ENDOBRONCHIAL ULTRASOUND N/A 05/28/2016   Procedure: VIDEO BRONCHOSCOPY WITH ENDOBRONCHIAL ULTRASOUND;  Surgeon: [name], MD;  Location: [facility];  Service: Thoracic;  Laterality: N/A;    Family History  Problem Relation Age of Onset   Hypertension Mother  Diabetes Father    Heart disease Father    Stroke Father    Hypertension Sister     Social History   Socioeconomic History   Marital status: Soil scientist    Spouse name: Not on file   Number of children: 5   Years of education: 10   Highest education level: 10th grade   Occupational History   Occupation: Retired  Tobacco Use   Smoking status: Every Day    Packs/day: 0.50    Years: 55.00    Total pack years: 27.50    Types: Cigarettes    Start date: 03/11/1961   Smokeless tobacco: Never   Tobacco comments:    1/2 pack to 1 pack per day 12/14/19  Vaping Use   Vaping Use: Never used  Substance and Sexual Activity   Alcohol use: No   Drug use: No   Sexual activity: Yes    Birth control/protection: None  Other Topics Concern   Not on file  Social History Narrative   [name] is his POA and lives with him. Children out of town - one in [facility], others in [place].   Social Determinants of Health   Financial Resource Strain: Low Risk  (09/18/2020)   Overall Financial Resource Strain (CARDIA)    Difficulty of Paying Living Expenses: Not hard at all  Food Insecurity: No Food Insecurity (09/18/2020)   Hunger Vital Sign    Worried About Running Out of Food in the Last Year: Never true    Ran Out of Food in the Last Year: Never true  Transportation Needs: No Transportation Needs (09/18/2020)   PRAPARE - Hydrologist (Medical): No    Lack of Transportation (Non-Medical): No  Physical Activity: Unknown (09/18/2020)   Exercise Vital Sign    Days of Exercise per Week: 7 days    Minutes of Exercise per Session: Not on file  Stress: No Stress Concern Present (09/18/2020)   [place]    Feeling of Stress : Not at all  Social Connections: Moderately Isolated (09/18/2020)   Social Connection and Isolation Panel [NHANES]    Frequency of Communication with Friends and Family: More than three times a week    Frequency of Social Gatherings with Friends and Family: Once a week    Attends Religious Services: Never    Marine scientist or Organizations: No    Attends Archivist Meetings: Never    Marital Status: Living with partner  Intimate Partner  Violence: Not At Risk (09/18/2020)   Humiliation, Afraid, Rape, and Kick questionnaire    Fear of Current or Ex-Partner: No    Emotionally Abused: No    Physically Abused: No    Sexually Abused: No     Review of System:   General: Negative for anorexia, weight loss, fever, chills, fatigue, +weakness. Eyes: Negative for vision changes.  ENT: Negative for hoarseness, difficulty swallowing , nasal congestion. CV: Negative for chest pain, angina, palpitations. +dyspnea on exertion.+peripheral edema.  Respiratory: Negative for dyspnea at rest. +dyspnea on exertion, +cough no sputum, wheezing.  GI: See history of present illness. GU:  Negative for dysuria, hematuria, urinary incontinence, urinary frequency, nocturnal urination.  MS: Negative for joint pain, low back pain.  Derm: Negative for rash or itching.  Neuro: Negative for weakness, abnormal sensation, seizure, frequent headaches, memory loss, confusion.  Psych: Negative for anxiety, depression, suicidal ideation, hallucinations.  Endo: Negative for  unusual weight change.  Heme: Negative for bruising or bleeding. Allergy: Negative for rash or hives.      Physical Examination:   Vital signs in last 24 hours: Temp:  [97.5 F (36.4 C)-98.7 F (37.1 C)] 97.5 F (36.4 C) (07/06 1116) Pulse Rate:  [81-102] 81 (07/06 1215) Resp:  [15] 15 (07/06 1116) BP: (98-140)/(45-84) 127/59 (07/06 1200) SpO2:  [90 %-100 %] 100 % (07/06 1215) Weight:  [72.6 kg-73.8 kg] 72.6 kg (07/06 1116)    General: somewhat hard of hearing. Resting comfortably. well-developed in no acute distress.  Head: Normocephalic, atraumatic.   Eyes: Conjunctiva pale. no icterus. Mouth: Oropharyngeal mucosa moist and pink , no lesions erythema or exudate. Neck: Supple without thyromegaly, masses, or lymphadenopathy.  Lungs: Clear to auscultation bilaterally.  Heart: Regular rate and rhythm, no murmurs rubs or gallops.  Abdomen: Bowel sounds are normal, nontender,  nondistended, no hepatosplenomegaly or masses, no abdominal bruits or hernia , no rebound or guarding.   Rectal: not peformed Extremities: 2-3+ edema to knees bilaterally. No clubbing, deformity.  Neuro: Alert and oriented x 4 , grossly normal neurologically.  Skin: Warm and dry, no rash or jaundice.   Psych: Alert and cooperative, normal mood and affect.        Intake/Output from previous day: No intake/output data recorded. Intake/Output this shift: No intake/output data recorded.  Lab Results:   CBC Recent Labs    10/09/21 0934 10/10/21 1121 10/10/21 1216  WBC 10.3 8.6  --   HGB 7.0* 7.4* 9.2*  HCT 22.4* 25.4* 27.0*  MCV 116* 125.7*  --   PLT 329 362  --    BMET Recent Labs    10/09/21 0934 10/10/21 1121 10/10/21 1216  NA 132* 133* 132*  K 4.8 3.6 3.6  CL 93* 93* 90*  CO2 24 30  --   GLUCOSE 208* 190* 165*  BUN 27 29* 26*  CREATININE 1.12 1.17 1.20  CALCIUM 8.7 8.3*  --    LFT Recent Labs    10/10/21 1121  BILITOT 1.0  ALKPHOS 126  AST 26  ALT 26  PROT 6.2*  ALBUMIN 3.2*    Lipase No results for input(s): "LIPASE" in the last 72 hours.  PT/INR No results for input(s): "LABPROT", "INR" in the last 72 hours.   Hepatitis Panel No results for input(s): "HEPBSAG", "HCVAB", "HEPAIGM", "HEPBIGM" in the last 72 hours.     Imaging Studies:   DG Chest Portable 1 View  Result Date: 10/10/2021 CLINICAL DATA:  Rales, weakness with dizziness EXAM: PORTABLE CHEST 1 VIEW COMPARISON:  Radiograph 09/13/2021, chest CT 06/19/2021 FINDINGS: Chest port catheter tip overlies the mid superior vena cava. Unchanged cardiomediastinal silhouette. There are post treatment changes in the right perihilar lung and left upper lung. Increased prominence of the interstitium. There is no new airspace consolidation. No pleural effusion. No pneumothorax. No acute osseous abnormality. Bilateral shoulder degenerative changes. Thoracic spondylosis. IMPRESSION: Post treatment changes in  the right perihilar lung and left upper lung. Increased prominence of the interstitium bilaterally, could reflect interstitial edema or atypical infectious/inflammatory process. Electronically Signed   By: Maurine Simmering M.D.   On: 10/10/2021 12:52   ECHOCARDIOGRAM COMPLETE  Result Date: 09/15/2021    ECHOCARDIOGRAM REPORT   Patient Name:   DEVONTAE CASASOLA Date of Exam: 09/15/2021 Medical Rec #:  [id number]        Height:       66.0 in Accession #:    [id number]  Weight:       164.8 lb Date of Birth:  07/19/1945        BSA:          1.842 m Patient Age:    81 years         BP:           117/45 mmHg Patient Gender: M                HR:           79 bpm. Exam Location:  Inpatient Procedure: 2D Echo, Cardiac Doppler, Color Doppler and Intracardiac            Opacification Agent Indications:    CHF  History:        Patient has prior history of Echocardiogram examinations, most                 recent 08/21/2020. COPD; Risk Factors:Hypertension, Diabetes and                 Sleep Apnea.  Sonographer:    Jefferey Pica Referring Phys: [id number] [place]  1. Aortic valve calcified with restricted motion but no AS by doppler.  2. Left ventricular ejection fraction, by estimation, is 55 to 60%. The left ventricle has normal function. The left ventricle has no regional wall motion abnormalities. Left ventricular diastolic parameters are indeterminate.  3. Right ventricular systolic function is normal. The right ventricular size is normal. There is mildly elevated pulmonary artery systolic pressure.  4. Left atrial size was mildly dilated.  5. The mitral valve is normal in structure. No evidence of mitral valve regurgitation. No evidence of mitral stenosis. Moderate mitral annular calcification.  6. The aortic valve is calcified. Aortic valve regurgitation is trivial. Aortic valve sclerosis/calcification is present, without any evidence of aortic stenosis.  7. The inferior vena cava is dilated in size with  <50% respiratory variability, suggesting right atrial pressure of 15 mmHg. FINDINGS  Left Ventricle: Left ventricular ejection fraction, by estimation, is 55 to 60%. The left ventricle has normal function. The left ventricle has no regional wall motion abnormalities. The left ventricular internal cavity size was normal in size. There is  no left ventricular hypertrophy. Left ventricular diastolic parameters are indeterminate. Right Ventricle: The right ventricular size is normal. There is mildly elevated pulmonary artery systolic pressure. The tricuspid regurgitant velocity is 2.50 m/s, and with an assumed right atrial pressure of 15 mmHg, the estimated right ventricular systolic pressure is 42.7 mmHg. Left Atrium: Left atrial size was mildly dilated. Right Atrium: Right atrial size was normal in size. Pericardium: There is no evidence of pericardial effusion. Mitral Valve: The mitral valve is normal in structure. Moderate mitral annular calcification. No evidence of mitral valve regurgitation. No evidence of mitral valve stenosis. MV peak gradient, 9.1 mmHg. The mean mitral valve gradient is 3.0 mmHg. Tricuspid Valve: The tricuspid valve is normal in structure. Tricuspid valve regurgitation is trivial. No evidence of tricuspid stenosis. Aortic Valve: The aortic valve is calcified. Aortic valve regurgitation is trivial. Aortic valve sclerosis/calcification is present, without any evidence of aortic stenosis. Aortic valve peak gradient measures 11.5 mmHg. Pulmonic Valve: The pulmonic valve was normal in structure. Pulmonic valve regurgitation is not visualized. No evidence of pulmonic stenosis. Aorta: The aortic root is normal in size and structure. Venous: The inferior vena cava is dilated in size with less than 50% respiratory variability, suggesting right atrial pressure of 15 mmHg. IAS/Shunts: No  atrial level shunt detected by color flow Doppler. Additional Comments: Aortic valve calcified with restricted motion  but no AS by doppler.  LEFT VENTRICLE PLAX 2D LVIDd:         4.00 cm   Diastology LVIDs:         3.40 cm   LV e' lateral:   8.03 cm/s LV PW:         0.90 cm   LV E/e' lateral: 19.8 LV IVS:        0.90 cm LVOT diam:     1.90 cm LV SV:         67 LV SV Index:   36 LVOT Area:     2.84 cm  IVC IVC diam: 2.50 cm LEFT ATRIUM             Index        RIGHT ATRIUM           Index LA diam:        4.20 cm 2.28 cm/m   RA Area:     16.70 cm LA Vol (A2C):   71.7 ml 38.93 ml/m  RA Volume:   42.50 ml  23.07 ml/m LA Vol (A4C):   56.3 ml 30.57 ml/m LA Biplane Vol: 65.1 ml 35.34 ml/m  AORTIC VALVE                 PULMONIC VALVE AV Area (Vmax): 1.87 cm     PV Vmax:       1.02 m/s AV Vmax:        169.50 cm/s  PV Peak grad:  4.2 mmHg AV Peak Grad:   11.5 mmHg LVOT Vmax:      112.00 cm/s LVOT Vmean:     70.200 cm/s LVOT VTI:       0.237 m  AORTA Ao Root diam: 3.20 cm MITRAL VALVE                TRICUSPID VALVE MV Area (PHT): 5.97 cm     TR Peak grad:   25.0 mmHg MV Area VTI:   2.20 cm     TR Vmax:        250.00 cm/s MV Peak grad:  9.1 mmHg MV Mean grad:  3.0 mmHg     SHUNTS MV Vmax:       1.51 m/s     Systemic VTI:  0.24 m MV Vmean:      81.0 cm/s    Systemic Diam: 1.90 cm MV Decel Time: 127 msec MV E velocity: 159.00 cm/s MV A velocity: 60.60 cm/s MV E/A ratio:  2.62 Kirk Ruths MD Electronically signed by Kirk Ruths MD Signature Date/Time: 09/15/2021/11:42:39 AM    Final    CT Abdomen Pelvis W Contrast  Result Date: 09/13/2021 CLINICAL DATA:  Abdominal pain. Acute nonlocalized. Bleeding ulcer. Hypotension. Low hemoglobin. Non-small cell lung cancer. * Tracking Code: BO * EXAM: CT ABDOMEN AND PELVIS WITH CONTRAST TECHNIQUE: Multidetector CT imaging of the abdomen and pelvis was performed using the standard protocol following bolus administration of intravenous contrast. RADIATION DOSE REDUCTION: This exam was performed according to the departmental dose-optimization program which includes automated exposure control,  adjustment of the mA and/or kV according to patient size and/or use of iterative reconstruction technique. CONTRAST:  163m OMNIPAQUE IOHEXOL 300 MG/ML  SOLN COMPARISON:  Chest CT 06/19/2021, PET-CT 08/13/2020 FINDINGS: Lower chest: 5 mm nodule in the LEFT lower lobe (image 1/4 present on comparison PET-CT scan from 08/26/2020. No  change in size. New RIGHT pleural effusion compared to most recent CT chest Hepatobiliary: No focal hepatic lesion. No biliary duct dilatation. Common bile duct is normal. Pancreas: Pancreas is normal. No ductal dilatation. No pancreatic inflammation. Spleen: Normal spleen Adrenals/urinary tract: Adrenal glands normal. Kidneys ureters and bladder are unchanged. 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 No change from multiple comparison exams Stomach/Bowel: Stomach is normal. Duodenum small-bowel normal. Appendix normal. Ascending, transverse and descending colon normal. No evidence of gastrointestinal bleeding, inflammation, or infection the GI tract. Vascular/Lymphatic: Abdominal aorta is normal caliber with atherosclerotic calcification. There is no retroperitoneal or periportal lymphadenopathy. No pelvic lymphadenopathy. Reproductive: TURP defect in the prostate gland Other: No intraperitoneal free fluid. Musculoskeletal: No aggressive osseous lesion. IMPRESSION: 1. No source of gastrointestinal bleeding identified. 2. No inflammation or infection identified in the GI tract. 3. New small RIGHT effusion. 4. Stable LEFT lobe pulmonary nodule. Electronically Signed   By: Suzy Bouchard M.D.   On: 09/13/2021 21:43   DG Chest 2 View  Result Date: 09/13/2021 CLINICAL DATA:  Weakness EXAM: CHEST - 2 VIEW COMPARISON:  None Available. FINDINGS: Pulmonary insufflation is normal and symmetric. There is parenchymal scarring within the lung apices noted. Right perihilar pulmonary opacity appears more prominent and may reflect post radiation changes  noted on CT examination of 06/19/2021, but is not optimally assessed on this examination. No pneumothorax or pleural effusion. Cardiac size within normal limits. Prominent mitral valve annular calcification noted. Pulmonary vascularity is normal. No acute bone abnormality. IMPRESSION: Progressive right posterior perihilar pulmonary opacity, possibly representing post radiation change. This could be better assessed with dedicated nonemergent contrast enhanced CT imaging No radiographic evidence of acute cardiopulmonary disease. Electronically Signed   By: Fidela Salisbury M.D.   On: 09/13/2021 19:38  [4 week]  Assessment:   76 y/o male with numerous medical problems as outlined above presenting for acute on chronic anemia found on outpatient labs.  Patient with history of occult GI bleeding in the setting of Eliquis, aspirin, and Plavix.    Acute on chronic anemia/heme positive stool: Denies overt GI bleeding.  Stools are dark on iron.  Admission last month, EGD and colonoscopy completed, cecal AVM treated.  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  He had nonbleeding cecal AVM noted in 06/2021, was not treated at that time. In 2022, he had a nonbleeding gastric ulcer.  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  Chronically on oral iron for iron deficiency anemia noted last year.  Ferritin of 19, iron 22, TIBC 508, iron saturation is 4% back in June 2022.  Last month his iron was 21, iron sats 5%, ferritin 30.  Folate normal.  Also with B12 deficiency (158) noted 5 to 6 months ago.  Currently on oral supplements.  Last B12 was 3634 three weeks ago. MCV remains elevated.  Plan:   Continue PPI. Allow clear liquids, NPO after midnight. May benefit from small bowel push enteroscopy and possible capsule endoscopy, to be determined by Dr. Jenetta Downer. Patient's last Eliquis was 10/09/21 evening.  Follow up Hgb post transfusion. Consider IV iron. He could be followed by hem/onc for  IDA/iron infusion needs to try and reduce needs for blood transfusions.    LOS: 0 days   We would like to thank you for the opportunity to participate in the care of John [name].  Laureen Ochs. Bernarda Caffey [facility] [phone] 7/6/202312:32 PM

## 2021-10-10 NOTE — ED Notes (Signed)
Pt last took Elloquis yesterday.

## 2021-10-10 NOTE — H&P (Signed)
History and Physical    John [name] JTT:[id number] DOB: Aug 23, 1945 DOA: 10/10/2021  PCP: [name], FNP  Patient coming from: Home  I have personally briefly reviewed patient's old medical records in [place]  Chief Complaint: Generalized weakness  HPI: TRACKER John [name] is a 76 y.o. male with medical history significant of atrial flutter on anticoagulation, COPD, non-small cell lung cancer, chronic respiratory failure with hypoxia, obstructive sleep apnea, hypothyroidism, who was recently admitted to the hospital with GI bleeding.  He had a colonic AVM which was treated with APC.  He has been continued on anticoagulation for atrial flutter.  He had gone to his cardiology follow-up yesterday and had lab work drawn that showed hemoglobin of 7.  He did describe having increasing weakness/fatigue over the past few weeks.  When asked about dark stools, he says that he takes iron and his stools are always dark.  He has not noticed any fresh blood in stools.  He has not had any chest pain, cough, fever, dysuria.  ED Course: He was evaluated the emergency room where rectal exam showed melanotic stools that was heme positive.  Noted to be significantly orthostatic with blood pressure down in the 80s on standing.  Hemoglobin was noted to be low at 7.4 and he was transfused 1 unit of PRBC.  GI was consulted.  Review of Systems: As per HPI otherwise 10 point review of systems negative.    Past Medical History:  Diagnosis Date   Adenocarcinoma of lung, right ([place]) 04/18/2016   Anxiety    Arthritis    Asthma    Atrial flutter (HCC)    BPH (benign prostatic hyperplasia)    with urinary retention 02/06/20   CHF (congestive heart failure) (HCC)    COPD (chronic obstructive pulmonary disease) ([place])    Depression    Diabetes mellitus, type II ([place])    Dyspnea    History of kidney stones    History of radiation therapy    right lung 09/10/2020-09/17/2020   Dr Sondra Come   Hyperlipidemia     Hypertension    Hypothyroidism    Macular degeneration    Neuropathy    Non-small cell lung cancer, right ([place]) 04/18/2016   Peripheral vascular disease ([place])    Prostatitis    Pulmonary nodule, left 07/16/2016   Sleep apnea    cpap    Past Surgical History:  Procedure Laterality Date   ABDOMINAL AORTOGRAM W/LOWER EXTREMITY Left 02/06/2020   Procedure: ABDOMINAL AORTOGRAM W/LOWER EXTREMITY;  Surgeon: Lorretta Harp, MD;  Location: [facility];  Service: Cardiovascular;  Laterality: Left;   ABDOMINAL AORTOGRAM W/LOWER EXTREMITY N/A 05/30/2021   Procedure: ABDOMINAL AORTOGRAM W/LOWER EXTREMITY;  Surgeon: Marty Heck, MD;  Location: [facility];  Service: Cardiovascular;  Laterality: N/A;   BIOPSY  09/16/2021   Procedure: BIOPSY;  Surgeon: Gatha Mayer, MD;  Location: [facility];  Service: Gastroenterology;;   CARDIOVERSION N/A 10/05/2020   Procedure: CARDIOVERSION;  Surgeon: Geralynn Rile, MD;  Location: [facility];  Service: Cardiovascular;  Laterality: N/A;   CATARACT EXTRACTION, BILATERAL Bilateral    COLONOSCOPY WITH PROPOFOL N/A 06/20/2021   Procedure: COLONOSCOPY WITH PROPOFOL;  Surgeon: Irene Shipper, MD;  Location: [facility];  Service: Endoscopy;  Laterality: N/A;   COLONOSCOPY WITH PROPOFOL N/A 09/16/2021   Procedure: COLONOSCOPY WITH PROPOFOL;  Surgeon: Gatha Mayer, MD;  Location: [facility];  Service: Gastroenterology;  Laterality: N/A;   ENDARTERECTOMY FEMORAL Right  08/20/2020   Procedure: ENDARTERECTOMY  RIGHT FEMORAL ARTERY;  Surgeon: Marty Heck, MD;  Location: [facility];  Service: Vascular;  Laterality: Right;   ESOPHAGOGASTRODUODENOSCOPY N/A 09/16/2021   Procedure: ESOPHAGOGASTRODUODENOSCOPY (EGD);  Surgeon: Gatha Mayer, MD;  Location: [facility];  Service: Gastroenterology;  Laterality: N/A;   ESOPHAGOGASTRODUODENOSCOPY (EGD) WITH PROPOFOL N/A 09/06/2020   Procedure: ESOPHAGOGASTRODUODENOSCOPY (EGD) WITH PROPOFOL;   Surgeon: Irene Shipper, MD;  [facility];  Service: Endoscopy;  Laterality: N/A;   HEMOSTASIS CLIP PLACEMENT  09/16/2021   Procedure: HEMOSTASIS CLIP PLACEMENT;  Surgeon: Gatha Mayer, MD;  Location: [facility];  Service: Gastroenterology;;   HOT HEMOSTASIS N/A 09/16/2021   Procedure: HOT HEMOSTASIS (ARGON PLASMA COAGULATION/BICAP);  Surgeon: Gatha Mayer, MD;  Location: [facility];  Service: Gastroenterology;  Laterality: N/A;   INSERTION OF ILIAC STENT Right 08/20/2020   Procedure: RETROGRADE INSERTION OF RIGHT ILIAC STENT;  Surgeon: Marty Heck, MD;  Location: [facility];  Service: Vascular;  Laterality: Right;   INTRAOPERATIVE ARTERIOGRAM Right 08/20/2020   Procedure: INTRA OPERATIVE ARTERIOGRAM ILIAC;  Surgeon: Marty Heck, MD;  Location: [facility];  Service: Vascular;  Laterality: Right;   PATCH ANGIOPLASTY Right 08/20/2020   Procedure: PATCH ANGIOPLASTY RIGHT FEMORAL ARTERY;  Surgeon: Marty Heck, MD;  Location: [place] [place];  Service: Vascular;  Laterality: Right;   PERIPHERAL VASCULAR BALLOON ANGIOPLASTY Right 05/30/2021   Procedure: PERIPHERAL VASCULAR BALLOON ANGIOPLASTY;  Surgeon: Marty Heck, MD;  Location: [facility];  Service: Cardiovascular;  Laterality: Right;   PORTACATH PLACEMENT Left 06/13/2016   Procedure: INSERTION PORT-A-CATH;  Surgeon: Aviva Signs, MD;  Location: [facility];  Service: General;  Laterality: Left;   TRANSURETHRAL RESECTION OF PROSTATE N/A 05/31/2020   Procedure: TRANSURETHRAL RESECTION OF THE PROSTATE (TURP);  Surgeon: Cleon Gustin, MD;  Location: [facility];  Service: Urology;  Laterality: N/A;   VIDEO BRONCHOSCOPY WITH ENDOBRONCHIAL NAVIGATION N/A 05/28/2016   Procedure: VIDEO BRONCHOSCOPY WITH ENDOBRONCHIAL NAVIGATION;  Surgeon: [name], MD;  Location: [facility];  Service: Thoracic;  Laterality: N/A;   VIDEO BRONCHOSCOPY WITH ENDOBRONCHIAL ULTRASOUND N/A 05/28/2016   Procedure: VIDEO BRONCHOSCOPY WITH  ENDOBRONCHIAL ULTRASOUND;  Surgeon: [name], MD;  Location: [facility];  Service: Thoracic;  Laterality: N/A;    Social History:  reports that he has been smoking cigarettes. He started smoking about 60 years ago. He has a 27.50 pack-year smoking history. He has never used smokeless tobacco. He reports that he does not drink alcohol and does not use drugs.  Allergies  Allergen Reactions   Sulfa Antibiotics Swelling    Mouth swelling   Atorvastatin Other (See Comments)    Muscle aches - tolerating Pravastatin 40 mg MWF   Jardiance [Empagliflozin] Other (See Comments)    FEELS SLUGGISH, TIRED   Lopressor [Metoprolol] Other (See Comments)    Fatigue   Rosuvastatin Other (See Comments)    Muscle aches - tolerating Pravastatin 40 mg MWF   Doxycycline Nausea Only   Temazepam Other (See Comments)    Made insomnia worse     Family History  Problem Relation Age of Onset   Hypertension Mother    Diabetes Father    Heart disease Father    Stroke Father    Hypertension Sister      Prior to Admission medications   Medication Sig Start Date End Date Taking? Authorizing Provider  Cyanocobalamin (B-12 PO) Take 1 tablet by mouth daily.   Yes [provider]  famotidine (PEPCID) 20 MG tablet One after supper Patient taking differently: Take 20 mg by mouth daily. 09/25/21  Yes Tanda Rockers, MD  fluticasone (FLONASE) 50 MCG/ACT nasal spray PLACE 1 SPRAY INTO BOTH NOSTRILS DAILY AS NEEDED FOR ALLERGIES OR RHINITIS. 04/17/21  Yes Hawks, Christy A, FNP  Fluticasone-Umeclidin-Vilant (TRELEGY ELLIPTA) 100-62.5-25 MCG/INH AEPB Inhale 1 puff into the lungs daily. 11/08/20  Yes Tanda Rockers, MD  gabapentin (NEURONTIN) 300 MG capsule TAKE 1 CAPSULE BY MOUTH THREE TIMES A DAY Patient taking differently: Take 300 mg by mouth 3 (three) times daily. 10/01/21  Yes Hawks, Christy A, FNP  Iron, Ferrous Sulfate, 325 (65 Fe) MG TABS Take 325 mg by mouth 2 (two) times daily. Patient taking  differently: Take 325 mg by mouth every evening. 04/23/21  Yes Hawks, Christy A, FNP  KLOR-CON M20 20 MEQ tablet TAKE 1 TABLET BY MOUTH EVERY DAY Patient taking differently: Take 20 mEq by mouth daily. 07/16/21  Yes Hawks, Christy A, FNP  levothyroxine (SYNTHROID) 50 MCG tablet TAKE 1 TABLET BY MOUTH EVERY DAY Patient taking differently: Take 50 mcg by mouth daily before breakfast. 07/16/21  Yes Hawks, [name], FNP  linaclotide (LINZESS) 72 MCG capsule Take 1 capsule (72 mcg total) by mouth daily before breakfast. Patient taking differently: Take 72 mcg by mouth daily as needed (constipation). 09/10/21  Yes Hawks, Christy A, FNP  Melatonin 10 MG TABS Take 10 mg by mouth at bedtime as needed (sleep).   Yes [provider]  ondansetron (ZOFRAN) 4 MG tablet Take 1 tablet (4 mg total) by mouth every 8 (eight) hours as needed for nausea or vomiting. 09/03/21  Yes Hawks, Christy A, FNP  OXYGEN Inhale 2 L into the lungs continuous.   Yes [provider]  pantoprazole (PROTONIX) 40 MG tablet Take 1 tablet (40 mg total) by mouth daily. 04/25/21 10/22/21 Yes Derek Jack, MD  pravastatin (PRAVACHOL) 40 MG tablet TAKE 1 TABLET (40 MG TOTAL) BY MOUTH ON MONDAYS , WEDNESDAYS, AND FRIDAYS AS DIRECTED Patient taking differently: Take 40 mg by mouth 3 (three) times a week. Mon, wed, and fri 10/01/21  Yes Hawks, Christy A, FNP  Semaglutide (RYBELSUS) 7 MG TABS Take 7 mg by mouth daily. 05/24/21  Yes Hawks, Christy A, FNP  spironolactone (ALDACTONE) 25 MG tablet Take 1 tablet (25 mg total) by mouth daily. 10/09/21 10/04/22 Yes O'Neal, Cassie Freer, MD  zolpidem (AMBIEN) 5 MG tablet Take 5 mg by mouth at bedtime as needed for sleep.   Yes [provider]  apixaban (ELIQUIS) 5 MG TABS tablet TAKE 1 TABLET BY MOUTH TWICE A DAY Patient not taking: Reported on 10/10/2021 04/16/21   Geralynn Rile, MD  Blood Glucose Monitoring Suppl (ONETOUCH VERIO REFLECT) w/Device KIT Use to test blood  sugars daily as directed. DX: E11.9 10/14/19   [name], FNP  NON FORMULARY CPAP at bedtime    [provider]  OneTouch Delica Lancets 51W MISC Use to test blood sugars daily as directed. DX: E11.9 10/14/19   Hawks, [name], FNP  ONETOUCH VERIO test strip USE TO TEST BLOOD SUGARS DAILY AS DIRECTED. DX: E11.9 12/28/20   Evelina Dun A, FNP  torsemide (DEMADEX) 20 MG tablet Take 2 tablets (40 mg) in the morning, take 2 tablets (40 mg) in the evening daily as needed. Patient not taking: Reported on 10/10/2021 09/06/21   Geralynn Rile, MD    Physical Exam: Vitals:   10/10/21 1359 10/10/21 1419 10/10/21  1546 10/10/21 1549  BP:  (!) 124/53 (!) 99/44   Pulse: 81 79 81   Resp: _0 Temp:  (!) 97.5 F (36.4 C) 98.9 F (37.2 C) 97.8 F (36.6 C)  TempSrc:  Oral  Axillary  SpO2: 100% 100% 93%   Weight:      Height:        Constitutional: NAD, calm, comfortable Eyes: PERRL, lids and conjunctivae normal ENMT: Mucous membranes are moist. Posterior pharynx clear of any exudate or lesions.Normal dentition.  Neck: normal, supple, no masses, no thyromegaly Respiratory: clear to auscultation bilaterally, no wheezing, no crackles. Normal respiratory effort. No accessory muscle use.  Cardiovascular: Regular rate and rhythm, no murmurs / rubs / gallops. 2+ extremity edema. 2+ pedal pulses. No carotid bruits.  Abdomen: no tenderness, no masses palpated. No hepatosplenomegaly. Bowel sounds positive.  Musculoskeletal: no clubbing / cyanosis. No joint deformity upper and lower extremities. Good ROM, no contractures. Normal muscle tone.  Skin: no rashes, lesions, ulcers. No induration Neurologic: CN 2-12 grossly intact. Sensation intact, DTR normal. Strength 5/5 in all 4.  Psychiatric: Somnolent, does wake up to voice and answer questions appropriately but falls back asleep during conversation   Labs on Admission: I have personally reviewed following labs and imaging  studies  CBC: Recent Labs  Lab 10/09/21 0934 10/10/21 1121 10/10/21 1216 10/10/21 1705  WBC 10.3 8.6  --  7.5  HGB 7.0* 7.4* 9.2* 8.1*  HCT 22.4* 25.4* 27.0* 27.2*  MCV 116* 125.7*  --  123.1*  PLT 329 362  --  341   Basic Metabolic Panel: Recent Labs  Lab 10/09/21 0934 10/10/21 1121 10/10/21 1216  NA 132* 133* 132*  K 4.8 3.6 3.6  CL 93* 93* 90*  CO2 24 30  --   GLUCOSE 208* 190* 165*  BUN 27 29* 26*  CREATININE 1.12 1.17 1.20  CALCIUM 8.7 8.3*  --    GFR: Estimated Creatinine Clearance: 48 mL/min (by C-G formula based on SCr of 1.2 mg/dL). Liver Function Tests: Recent Labs  Lab 10/10/21 1121  AST 26  ALT 26  ALKPHOS 126  BILITOT 1.0  PROT 6.2*  ALBUMIN 3.2*   No results for input(s): "LIPASE", "AMYLASE" in the last 168 hours. Recent Labs  Lab 10/10/21 1705  AMMONIA 14   Coagulation Profile: No results for input(s): "INR", "PROTIME" in the last 168 hours. Cardiac Enzymes: No results for input(s): "CKTOTAL", "CKMB", "CKMBINDEX", "TROPONINI" in the last 168 hours. BNP (last 3 results) No results for input(s): "PROBNP" in the last 8760 hours. HbA1C: No results for input(s): "HGBA1C" in the last 72 hours. CBG: No results for input(s): "GLUCAP" in the last 168 hours. Lipid Profile: No results for input(s): "CHOL", "HDL", "LDLCALC", "TRIG", "CHOLHDL", "LDLDIRECT" in the last 72 hours. Thyroid Function Tests: No results for input(s): "TSH", "T4TOTAL", "FREET4", "T3FREE", "THYROIDAB" in the last 72 hours. Anemia Panel: No results for input(s): "VITAMINB12", "FOLATE", "FERRITIN", "TIBC", "IRON", "RETICCTPCT" in the last 72 hours. Urine analysis:    Component Value Date/Time   COLORURINE STRAW (A) 08/14/2020 1122   APPEARANCEUR Clear 10/07/2021 1152   LABSPEC 1.008 08/14/2020 1122   PHURINE 7.0 08/14/2020 1122   GLUCOSEU Negative 10/07/2021 1152   HGBUR NEGATIVE 08/14/2020 1122   BILIRUBINUR Negative 10/07/2021 [place] 08/14/2020  1122   PROTEINUR Negative 10/07/2021 1152   PROTEINUR NEGATIVE 08/14/2020 1122   NITRITE Negative 10/07/2021 1152   NITRITE NEGATIVE 08/14/2020 1122   LEUKOCYTESUR Negative  10/07/2021 [place] 08/14/2020 1122    Radiological Exams on Admission: DG Chest Portable 1 View  Result Date: 10/10/2021 CLINICAL DATA:  Rales, weakness with dizziness EXAM: PORTABLE CHEST 1 VIEW COMPARISON:  Radiograph 09/13/2021, chest CT 06/19/2021 FINDINGS: Chest port catheter tip overlies the mid superior vena cava. Unchanged cardiomediastinal silhouette. There are post treatment changes in the right perihilar lung and left upper lung. Increased prominence of the interstitium. There is no new airspace consolidation. No pleural effusion. No pneumothorax. No acute osseous abnormality. Bilateral shoulder degenerative changes. Thoracic spondylosis. IMPRESSION: Post treatment changes in the right perihilar lung and left upper lung. Increased prominence of the interstitium bilaterally, could reflect interstitial edema or atypical infectious/inflammatory process. Electronically Signed   By: Maurine Simmering M.D.   On: 10/10/2021 12:52    EKG: Independently reviewed. Sinus rhythm with PACs  Assessment/Plan Active Problems:   Acute on chronic anemia   COPD (chronic obstructive pulmonary disease) (HCC)   Type 2 diabetes mellitus with hyperglycemia, without long-term current use of insulin (HCC)   Chronic respiratory failure with hypoxia (HCC)   Hypothyroidism   Non-small cell lung cancer, right (HCC)   PAD (peripheral artery disease) (HCC)   Obstructive sleep apnea treated with continuous positive airway pressure (CPAP)   Atrial flutter (HCC)   GIB (gastrointestinal bleeding)   Lethargy     Acute on chronic anemia likely secondary to chronic GI blood loss -Started on Protonix -He has been transfused 1 unit of PRBC -Follow-up serial CBCs -GI consulted -Keep n.p.o. after midnight  Lethargy -Etiology  appears to be unclear since he did appear to be more awake when he was in the emergency room -Check ABG -We will give small bolus of IV fluids since blood pressures in the 90s.  Chronic diastolic heart failure -Clearly has some evidence of volume overload, avoid diuresis will be challenging in light of borderline low blood pressures -Once blood pressure will tolerate, will start on IV Lasix  Obstructive sleep apnea -Continue on CPAP  COPD -Continue on Trelegy -Bronchodilators as needed  Atrial flutter -CHA2DS2-VASc of 5 -Heart rate currently stable -Based on discussion that was had with cardiology yesterday, seems to be too high risk to continue on anticoagulation would hold further anticoagulation for now until he can follow-up with cardiology. -May need to be considered for Watchman procedure  PAD -Status post right common iliac stent in 2022 -Restart aspirin once GI bleeding issues have been addressed  Hyperlipidemia -Continue statin  Hypothyroidism -Continue Synthroid  Non-small cell lung cancer -Status post chemo, radiation in 2018/2019  DVT prophylaxis: scd  Code Status: full code  Family Communication: no family present  Disposition Plan: discharge home once hemodynamics stabilized  Consults called: GI  Admission status: observation, tele   Kathie Dike MD [facility]   If 7PM-7AM, please contact night-coverage [email or web address]   10/10/2021, 5:31 PM

## 2021-10-10 NOTE — Progress Notes (Signed)
Left chest port assessed by [facility]

## 2021-10-11 ENCOUNTER — Observation Stay (HOSPITAL_COMMUNITY): Payer: Medicare Other | Admitting: Anesthesiology

## 2021-10-11 ENCOUNTER — Encounter (HOSPITAL_COMMUNITY): Payer: Self-pay | Admitting: Internal Medicine

## 2021-10-11 ENCOUNTER — Encounter (HOSPITAL_COMMUNITY)
Admission: EM | Disposition: A | Discharge status: Home / Self Care | Payer: Self-pay | Source: Home / Self Care | Attending: Internal Medicine

## 2021-10-11 DIAGNOSIS — K5520 Angiodysplasia of colon without hemorrhage: Secondary | ICD-10-CM

## 2021-10-11 DIAGNOSIS — K5909 Other constipation: Secondary | ICD-10-CM | POA: Diagnosis present

## 2021-10-11 DIAGNOSIS — K31811 Angiodysplasia of stomach and duodenum with bleeding: Secondary | ICD-10-CM | POA: Diagnosis present

## 2021-10-11 DIAGNOSIS — K31819 Angiodysplasia of stomach and duodenum without bleeding: Secondary | ICD-10-CM | POA: Diagnosis not present

## 2021-10-11 DIAGNOSIS — Z9221 Personal history of antineoplastic chemotherapy: Secondary | ICD-10-CM | POA: Diagnosis not present

## 2021-10-11 DIAGNOSIS — I5032 Chronic diastolic (congestive) heart failure: Secondary | ICD-10-CM | POA: Diagnosis present

## 2021-10-11 DIAGNOSIS — Z79899 Other long term (current) drug therapy: Secondary | ICD-10-CM | POA: Diagnosis not present

## 2021-10-11 DIAGNOSIS — I483 Typical atrial flutter: Secondary | ICD-10-CM | POA: Diagnosis not present

## 2021-10-11 DIAGNOSIS — J449 Chronic obstructive pulmonary disease, unspecified: Secondary | ICD-10-CM | POA: Diagnosis present

## 2021-10-11 DIAGNOSIS — Z8711 Personal history of peptic ulcer disease: Secondary | ICD-10-CM | POA: Diagnosis not present

## 2021-10-11 DIAGNOSIS — I4891 Unspecified atrial fibrillation: Secondary | ICD-10-CM | POA: Diagnosis present

## 2021-10-11 DIAGNOSIS — I4892 Unspecified atrial flutter: Secondary | ICD-10-CM | POA: Diagnosis present

## 2021-10-11 DIAGNOSIS — E538 Deficiency of other specified B group vitamins: Secondary | ICD-10-CM | POA: Diagnosis present

## 2021-10-11 DIAGNOSIS — K449 Diaphragmatic hernia without obstruction or gangrene: Secondary | ICD-10-CM | POA: Diagnosis present

## 2021-10-11 DIAGNOSIS — D500 Iron deficiency anemia secondary to blood loss (chronic): Secondary | ICD-10-CM | POA: Diagnosis present

## 2021-10-11 DIAGNOSIS — F17210 Nicotine dependence, cigarettes, uncomplicated: Secondary | ICD-10-CM | POA: Diagnosis present

## 2021-10-11 DIAGNOSIS — K558 Other vascular disorders of intestine: Secondary | ICD-10-CM | POA: Diagnosis not present

## 2021-10-11 DIAGNOSIS — E1151 Type 2 diabetes mellitus with diabetic peripheral angiopathy without gangrene: Secondary | ICD-10-CM | POA: Diagnosis present

## 2021-10-11 DIAGNOSIS — K5900 Constipation, unspecified: Secondary | ICD-10-CM | POA: Diagnosis not present

## 2021-10-11 DIAGNOSIS — D509 Iron deficiency anemia, unspecified: Secondary | ICD-10-CM | POA: Diagnosis not present

## 2021-10-11 DIAGNOSIS — Z923 Personal history of irradiation: Secondary | ICD-10-CM | POA: Diagnosis not present

## 2021-10-11 DIAGNOSIS — K922 Gastrointestinal hemorrhage, unspecified: Secondary | ICD-10-CM

## 2021-10-11 DIAGNOSIS — E785 Hyperlipidemia, unspecified: Secondary | ICD-10-CM | POA: Diagnosis present

## 2021-10-11 DIAGNOSIS — D649 Anemia, unspecified: Secondary | ICD-10-CM | POA: Diagnosis present

## 2021-10-11 DIAGNOSIS — E039 Hypothyroidism, unspecified: Secondary | ICD-10-CM | POA: Diagnosis present

## 2021-10-11 DIAGNOSIS — E1165 Type 2 diabetes mellitus with hyperglycemia: Secondary | ICD-10-CM | POA: Diagnosis present

## 2021-10-11 DIAGNOSIS — Z9981 Dependence on supplemental oxygen: Secondary | ICD-10-CM | POA: Diagnosis not present

## 2021-10-11 DIAGNOSIS — Z7901 Long term (current) use of anticoagulants: Secondary | ICD-10-CM | POA: Diagnosis not present

## 2021-10-11 DIAGNOSIS — I110 Hypertensive heart disease with heart failure: Secondary | ICD-10-CM | POA: Diagnosis present

## 2021-10-11 DIAGNOSIS — J9611 Chronic respiratory failure with hypoxia: Secondary | ICD-10-CM | POA: Diagnosis present

## 2021-10-11 DIAGNOSIS — Z85118 Personal history of other malignant neoplasm of bronchus and lung: Secondary | ICD-10-CM | POA: Diagnosis not present

## 2021-10-11 DIAGNOSIS — G4733 Obstructive sleep apnea (adult) (pediatric): Secondary | ICD-10-CM | POA: Diagnosis present

## 2021-10-11 HISTORY — PX: HOT HEMOSTASIS: SHX5433

## 2021-10-11 HISTORY — PX: ENTEROSCOPY: SHX5533

## 2021-10-11 HISTORY — PX: ESOPHAGOGASTRODUODENOSCOPY (EGD) WITH PROPOFOL: SHX5813

## 2021-10-11 LAB — TYPE AND SCREEN
ABO/RH(D): O POS
Antibody Screen: NEGATIVE
Unit division: 0

## 2021-10-11 LAB — CBC
HCT: 28.6 % — ABNORMAL LOW (ref 39.0–52.0)
Hemoglobin: 8.4 g/dL — ABNORMAL LOW (ref 13.0–17.0)
MCH: 36.2 pg — ABNORMAL HIGH (ref 26.0–34.0)
MCHC: 29.4 g/dL — ABNORMAL LOW (ref 30.0–36.0)
MCV: 123.3 fL — ABNORMAL HIGH (ref 80.0–100.0)
Platelets: 321 10*3/uL (ref 150–400)
RBC: 2.32 MIL/uL — ABNORMAL LOW (ref 4.22–5.81)
RDW: 22.5 % — ABNORMAL HIGH (ref 11.5–15.5)
WBC: 8.8 10*3/uL (ref 4.0–10.5)
nRBC: 0.2 % (ref 0.0–0.2)

## 2021-10-11 LAB — COMPREHENSIVE METABOLIC PANEL
ALT: 20 U/L (ref 0–44)
AST: 21 U/L (ref 15–41)
Albumin: 2.7 g/dL — ABNORMAL LOW (ref 3.5–5.0)
Alkaline Phosphatase: 107 U/L (ref 38–126)
Anion gap: 8 (ref 5–15)
BUN: 19 mg/dL (ref 8–23)
CO2: 29 mmol/L (ref 22–32)
Calcium: 8.3 mg/dL — ABNORMAL LOW (ref 8.9–10.3)
Chloride: 98 mmol/L (ref 98–111)
Creatinine, Ser: 0.84 mg/dL (ref 0.61–1.24)
GFR, Estimated: >60 mL/min (ref >60)
Glucose, Bld: 104 mg/dL — ABNORMAL HIGH (ref 70–99)
Potassium: 3.5 mmol/L (ref 3.5–5.1)
Sodium: 135 mmol/L (ref 135–145)
Total Bilirubin: 1.1 mg/dL (ref 0.3–1.2)
Total Protein: 5.5 g/dL — ABNORMAL LOW (ref 6.5–8.1)

## 2021-10-11 LAB — BPAM RBC
Blood Product Expiration Date: 202308032359
ISSUE DATE / TIME: 202307061247
Unit Type and Rh: 5100

## 2021-10-11 LAB — GLUCOSE, CAPILLARY: Glucose-Capillary: 126 mg/dL — ABNORMAL HIGH (ref 70–99)

## 2021-10-11 SURGERY — ENTEROSCOPY
Anesthesia: Monitor Anesthesia Care

## 2021-10-11 SURGERY — ESOPHAGOGASTRODUODENOSCOPY (EGD) WITH PROPOFOL
Anesthesia: General

## 2021-10-11 MED ORDER — EPHEDRINE SULFATE (PRESSORS) 50 MG/ML IJ SOLN
INTRAMUSCULAR | Status: DC | PRN
  Administered 2021-10-11: 10 mg via INTRAVENOUS

## 2021-10-11 MED ORDER — PROPOFOL 10 MG/ML IV BOLUS
INTRAVENOUS | Status: DC | PRN
  Administered 2021-10-11: 30 mg via INTRAVENOUS
  Administered 2021-10-11: 20 mg via INTRAVENOUS
  Administered 2021-10-11: 80 mg via INTRAVENOUS
  Administered 2021-10-11 (×5): 20 mg via INTRAVENOUS

## 2021-10-11 MED ORDER — LACTATED RINGERS IV SOLN: INTRAVENOUS | Status: DC | PRN

## 2021-10-11 MED ORDER — IPRATROPIUM-ALBUTEROL 0.5-2.5 (3) MG/3ML IN SOLN
3.0000 mL | Freq: Once | RESPIRATORY_TRACT | Status: AC
  Administered 2021-10-11: 3 mL via RESPIRATORY_TRACT

## 2021-10-11 MED ORDER — ESMOLOL HCL 100 MG/10ML IV SOLN
INTRAVENOUS | Status: DC | PRN
  Administered 2021-10-11 (×2): 5 mg via INTRAVENOUS

## 2021-10-11 MED ORDER — SODIUM CHLORIDE 0.9 % IV SOLN: INTRAVENOUS | Status: DC

## 2021-10-11 MED ORDER — GLUCAGON HCL RDNA (DIAGNOSTIC) 1 MG IJ SOLR
INTRAMUSCULAR | Status: DC | PRN
  Administered 2021-10-11: .25 mg via INTRAVENOUS

## 2021-10-11 MED ORDER — IPRATROPIUM-ALBUTEROL 0.5-2.5 (3) MG/3ML IN SOLN
RESPIRATORY_TRACT | Status: AC
  Filled 2021-10-11: qty 3

## 2021-10-11 MED ORDER — LACTATED RINGERS IV SOLN
INTRAVENOUS | Status: DC
  Administered 2021-10-11: 1000 mL via INTRAVENOUS

## 2021-10-11 MED ORDER — PHENYLEPHRINE HCL (PRESSORS) 10 MG/ML IV SOLN
INTRAVENOUS | Status: DC | PRN
  Administered 2021-10-11 (×2): 100 ug via INTRAVENOUS

## 2021-10-11 MED ORDER — CHLORHEXIDINE GLUCONATE CLOTH 2 % EX PADS
6.0000 | MEDICATED_PAD | Freq: Every day | CUTANEOUS | Status: DC
Start: 2021-10-11 — End: 2021-10-12
  Administered 2021-10-11 – 2021-10-12 (×2): 6 via TOPICAL

## 2021-10-11 MED ORDER — FUROSEMIDE 10 MG/ML IJ SOLN
80.0000 mg | Freq: Two times a day (BID) | INTRAMUSCULAR | Status: DC
  Administered 2021-10-11 – 2021-10-12 (×2): 80 mg via INTRAVENOUS
  Filled 2021-10-11 (×2): qty 8

## 2021-10-11 MED ORDER — LIDOCAINE HCL (CARDIAC) PF 100 MG/5ML IV SOSY
PREFILLED_SYRINGE | INTRAVENOUS | Status: DC | PRN
  Administered 2021-10-11: 60 mg via INTRATRACHEAL

## 2021-10-11 NOTE — Anesthesia Postprocedure Evaluation (Addendum)
Anesthesia Post Note  Patient: John [name]  Procedure(s) Performed: ESOPHAGOGASTRODUODENOSCOPY (EGD) WITH PROPOFOL ENTEROSCOPY HOT HEMOSTASIS (ARGON PLASMA COAGULATION/BICAP)  Patient location during evaluation: PACU Anesthesia Type: General Level of consciousness: awake and alert and oriented Pain management: pain level controlled Vital Signs Assessment: post-procedure vital signs reviewed and stable Respiratory status: spontaneous breathing, nonlabored ventilation, respiratory function stable and patient connected to nasal cannula oxygen Cardiovascular status: blood pressure returned to baseline and stable Postop Assessment: no apparent nausea or vomiting Anesthetic complications: no   No notable events documented.   Last Vitals:  Vitals:   10/11/21 1333 10/11/21 1349  BP: (!) 110/57 (!) 112/46  Pulse: (!) 107 96  Resp: 15 17  Temp: (!) 36.4 C   SpO2: 100% 100%    Last Pain:  Vitals:   10/11/21 1349  TempSrc:   PainSc: 0-No pain                 John [name] C Dwanna Goshert

## 2021-10-11 NOTE — Addendum Note (Signed)
Addendum  created 10/11/21 1524 by Denese Killings, MD   Clinical Note Signed

## 2021-10-11 NOTE — Progress Notes (Signed)
PROGRESS NOTE    John [name]  VQM:[id number] DOB: 03-25-1946 DOA: 10/10/2021 PCP: John Balloon, FNP    Brief Narrative:  John [name] is a 76 y.o. male with medical history significant of atrial flutter on anticoagulation, COPD, non-small cell lung cancer, chronic respiratory failure with hypoxia, obstructive sleep apnea, hypothyroidism, who was recently admitted to the hospital with GI bleeding.  He had a colonic AVM which was treated with APC.  He has been continued on anticoagulation for atrial flutter.  He had gone to his cardiology follow-up yesterday and had lab work drawn that showed hemoglobin of 7.  He did describe having increasing weakness/fatigue over the past few weeks.  When asked about dark stools, he says that he takes iron and his stools are always dark.  He has not noticed any fresh blood in stools.  He has not had any chest pain, cough, fever, dysuria.   ED Course: He was evaluated the emergency room where rectal exam showed melanotic stools that was heme positive.  Noted to be significantly orthostatic with blood pressure down in the 80s on standing.  Hemoglobin was noted to be low at 7.4 and he was transfused 1 unit of PRBC.  GI was consulted   Assessment & Plan:   Principal Problem:   GI bleed Active Problems:   Acute on chronic anemia   COPD (chronic obstructive pulmonary disease) (HCC)   Type 2 diabetes mellitus with hyperglycemia, without long-term current use of insulin (HCC)   Chronic respiratory failure with hypoxia (HCC)   Hypothyroidism   Non-small cell lung cancer, right (HCC)   PAD (peripheral artery disease) (HCC)   Obstructive sleep apnea treated with continuous positive airway pressure (CPAP)   Atrial flutter (HCC)   GIB (gastrointestinal bleeding)   Lethargy   Acute on chronic anemia likely secondary to chronic GI blood loss -Started on Protonix -He has been transfused 1 unit of PRBC -Follow-up CBC showed improvement of hemoglobin -GI  consulted -Patient underwent push enteroscopy, gastric/duodenal AVMs treated with APC   Lethargy -Appears to have resolved -ABG, ammonia unrevealing   Chronic diastolic heart failure -Started on IV Lasix today since he does have some lower extremity edema    Obstructive sleep apnea -Continue on CPAP   COPD -Continue on Trelegy -Bronchodilators as needed   Atrial flutter -CHA2DS2-VASc of 5 -Heart rate currently stable -Based on discussion that patient had with his cardiologist on day prior to admission, seems to be too high risk to continue on anticoagulation.  Would hold further anticoagulation for now until he can follow-up with cardiology. -May need to be considered for Watchman procedure   PAD -Status post right common iliac stent in 2022 -Restart aspirin once GI bleeding issues have been addressed   Hyperlipidemia -Continue statin   Hypothyroidism -Continue Synthroid   Non-small cell lung cancer -Status post chemo, radiation in 2018/2019   DVT prophylaxis: SCDs Start: 10/10/21 1627  Code Status: Full code Family Communication: Discussed with patient Disposition Plan: Status is: Inpatient Remains inpatient appropriate because: Possible discharge home tomorrow if hemoglobin stable and volume status is stable     Consultants:  GI  Procedures:  Push enteroscopy: 6 nonbleeding gastric AVMs were found in gastric body, some stigmata of recent bleeding, treated with APC.  6 other smaller AVMs were present in the duodenum and jejunum which were also treated with APC.  No active bleeding  Antimicrobials:      Subjective: Seen in his room postprocedure.  He  does not have any abdominal pain.  No nausea or vomiting.  Objective: Vitals:   10/11/21 0851 10/11/21 1226 10/11/21 1333 10/11/21 1349  BP:  (!) 140/58 (!) 110/57 (!) 112/46  Pulse:  84 (!) 107 96  Resp:  20 15 17   Temp:  (!) 97.5 F (36.4 C) (!) 97.5 F (36.4 C)   TempSrc:  Oral    SpO2: 93% 100%  100% 100%  Weight:      Height:        Intake/Output Summary (Last 24 hours) at 10/11/2021 1917 Last data filed at 10/11/2021 1839 Gross per 24 hour  Intake 365 ml  Output 800 ml  Net -435 ml   Filed Weights   10/10/21 1116  Weight: 72.6 kg    Examination:  General exam: Appears calm and comfortable  Respiratory system: Clear to auscultation. Respiratory effort normal. Cardiovascular system: S1 & S2 heard, RRR. No JVD, murmurs, rubs, gallops or clicks.  Gastrointestinal system: Abdomen is nondistended, soft and nontender. No organomegaly or masses felt. Normal bowel sounds heard. Central nervous system: Alert and oriented. No focal neurological deficits. Extremities: 1+ edema Skin: No rashes, lesions or ulcers Psychiatry: Judgement and insight appear normal. Mood & affect appropriate.     Data Reviewed: I have personally reviewed following labs and imaging studies  CBC: Recent Labs  Lab 10/09/21 0934 10/10/21 1121 10/10/21 1216 10/10/21 1705 10/11/21 0441  WBC 10.3 8.6  --  7.5 8.8  HGB 7.0* 7.4* 9.2* 8.1* 8.4*  HCT 22.4* 25.4* 27.0* 27.2* 28.6*  MCV 116* 125.7*  --  123.1* 123.3*  PLT 329 362  --  312 127   Basic Metabolic Panel: Recent Labs  Lab 10/09/21 0934 10/10/21 1121 10/10/21 1216 10/11/21 0441  NA 132* 133* 132* 135  K 4.8 3.6 3.6 3.5  CL 93* 93* 90* 98  CO2 24 30  --  29  GLUCOSE 208* 190* 165* 104*  BUN 27 29* 26* 19  CREATININE 1.12 1.17 1.20 0.84  CALCIUM 8.7 8.3*  --  8.3*   GFR: Estimated Creatinine Clearance: 68.6 mL/min (by C-G formula based on SCr of 0.84 mg/dL). Liver Function Tests: Recent Labs  Lab 10/10/21 1121 10/11/21 0441  AST 26 21  ALT 26 20  ALKPHOS 126 107  BILITOT 1.0 1.1  PROT 6.2* 5.5*  ALBUMIN 3.2* 2.7*   No results for input(s): "LIPASE", "AMYLASE" in the last 168 hours. Recent Labs  Lab 10/10/21 1705  AMMONIA 14   Coagulation Profile: No results for input(s): "INR", "PROTIME" in the last 168  hours. Cardiac Enzymes: No results for input(s): "CKTOTAL", "CKMB", "CKMBINDEX", "TROPONINI" in the last 168 hours. BNP (last 3 results) No results for input(s): "PROBNP" in the last 8760 hours. HbA1C: No results for input(s): "HGBA1C" in the last 72 hours. CBG: Recent Labs  Lab 10/11/21 1234  GLUCAP 126*   Lipid Profile: No results for input(s): "CHOL", "HDL", "LDLCALC", "TRIG", "CHOLHDL", "LDLDIRECT" in the last 72 hours. Thyroid Function Tests: No results for input(s): "TSH", "T4TOTAL", "FREET4", "T3FREE", "THYROIDAB" in the last 72 hours. Anemia Panel: No results for input(s): "VITAMINB12", "FOLATE", "FERRITIN", "TIBC", "IRON", "RETICCTPCT" in the last 72 hours. Sepsis Labs: No results for input(s): "PROCALCITON", "LATICACIDVEN" in the last 168 hours.  Recent Results (from the past 240 hour(s))  Microscopic Examination     Status: None   Collection Time: 10/07/21 11:52 AM   Urine  Result Value Ref Range Status   WBC, UA None seen 0 - 5 /  hpf Final   RBC, Urine None seen 0 - 2 /hpf Final   Epithelial Cells (non renal) None seen 0 - 10 /hpf Final   Renal Epithel, UA None seen None seen /hpf Final   Bacteria, UA None seen None seen/Few Final         Radiology Studies: DG Chest Portable 1 View  Result Date: 10/10/2021 CLINICAL DATA:  Rales, weakness with dizziness EXAM: PORTABLE CHEST 1 VIEW COMPARISON:  Radiograph 09/13/2021, chest CT 06/19/2021 FINDINGS: Chest port catheter tip overlies the mid superior vena cava. Unchanged cardiomediastinal silhouette. There are post treatment changes in the right perihilar lung and left upper lung. Increased prominence of the interstitium. There is no new airspace consolidation. No pleural effusion. No pneumothorax. No acute osseous abnormality. Bilateral shoulder degenerative changes. Thoracic spondylosis. IMPRESSION: Post treatment changes in the right perihilar lung and left upper lung. Increased prominence of the interstitium  bilaterally, could reflect interstitial edema or atypical infectious/inflammatory process. Electronically Signed   By: Maurine Simmering M.D.   On: 10/10/2021 12:52        Scheduled Meds:  Chlorhexidine Gluconate Cloth  6 each Topical Daily   fluticasone furoate-vilanterol  1 puff Inhalation Daily   And   umeclidinium bromide  1 puff Inhalation Daily   furosemide  80 mg Intravenous BID   ipratropium-albuterol       levothyroxine  50 mcg Oral QAC breakfast   pantoprazole (PROTONIX) IV  40 mg Intravenous Q12H   pravastatin  40 mg Oral Once per day on Mon Wed Fri   Continuous Infusions:  sodium chloride       LOS: 0 days    Time spent: 74mins    Kathie Dike, MD [facility]   If 7PM-7AM, please contact night-coverage [email or web address]  10/11/2021, 7:17 PM

## 2021-10-11 NOTE — Progress Notes (Signed)
We will proceed with push enteroscopy as scheduled.  I thoroughly discussed with the patient his procedure, including the risks involved. Patient understands what the procedure involves including the benefits and any risks. Patient understands alternatives to the proposed procedure. Risks including (but not limited to) bleeding, tearing of the lining (perforation), rupture of adjacent organs, problems with heart and lung function, infection, and medication reactions. A small percentage of complications may require surgery, hospitalization, repeat endoscopic procedure, and/or transfusion.  Patient understood and agreed.  John Peppers, MD Gastroenterology and Hepatology [facility]

## 2021-10-11 NOTE — Transfer of Care (Signed)
Immediate Anesthesia Transfer of Care Note  Patient: John [name]  Procedure(s) Performed: ESOPHAGOGASTRODUODENOSCOPY (EGD) WITH PROPOFOL ENTEROSCOPY HOT HEMOSTASIS (ARGON PLASMA COAGULATION/BICAP)  Patient Location: PACU  Anesthesia Type:General  Level of Consciousness: awake and alert   Airway & Oxygen Therapy: Patient Spontanous Breathing and Patient connected to nasal cannula oxygen  Post-op Assessment: Report given to RN and Post -op Vital signs reviewed and stable  Post vital signs: Reviewed and stable  Last Vitals:  Vitals Value Taken Time  BP 110/57 10/11/21 1335  Temp    Pulse 98 10/11/21 1336  Resp 20 10/11/21 1338  SpO2 100 % 10/11/21 1336  Vitals shown include unvalidated device data.  Last Pain:  Vitals:   10/11/21 1241  TempSrc:   PainSc: 4       Patients Stated Pain Goal: 6 (32/95/18 8416)  Complications: No notable events documented.

## 2021-10-11 NOTE — Brief Op Note (Signed)
10/10/2021 - 10/11/2021  1:34 PM  PATIENT:  John [name]  76 y.o. male  PRE-OPERATIVE DIAGNOSIS:  anemia, history of colonic AVM, melanotic stool  POST-OPERATIVE DIAGNOSIS:  small bowel AVMs treated w/ APC x6, hiatal hernia, gastric AVMs x6 treated w/ APC  PROCEDURE:  Procedure(s): ESOPHAGOGASTRODUODENOSCOPY (EGD) WITH PROPOFOL (N/A) ENTEROSCOPY (N/A) HOT HEMOSTASIS (ARGON PLASMA COAGULATION/BICAP)  SURGEON:  Surgeon(s) and Role:    * Harvel Quale, MD - Primary  Patient underwent push enteroscopy under propofol sedation.  Tolerated the procedure adequately.  Esophagus was normal. 6 non bleeding gastric AVMs were found in the gastric body, some stigmata of recent bleeding, these were ablated with APC. 6 other smaller Avms were present in the duodenum and jejunum, which were ablated with APC. No active bleeding was seen.  RECOMMENDATIONS - Return patient to hospital ward for ongoing care.  - Soft diet today.  - Pantoprazole 40 mg twice a day PO. - May consider restarting anticoagulation tomorrow if H/H is stable. - H/H daily.  Maylon Peppers, MD Gastroenterology and Hepatology [facility]

## 2021-10-11 NOTE — Op Note (Signed)
[facility] Patient Name: John [name] Procedure Date: 10/11/2021 1:29 PM MRN: [id number] Date of Birth: 1945-11-01 Attending MD: Maylon Peppers ,  CSN: [id number] Age: 76 Admit Type: Inpatient Procedure:                Small bowel enteroscopy Indications:              Iron deficiency anemia Providers:                Maylon Peppers, Caprice Kluver, Selena Lesser RN,                            RN, Raphael Gibney, Technician, Everardo Pacific Referring MD:              Medicines:                Monitored Anesthesia Care Complications:            No immediate complications. Estimated Blood Loss:     Estimated blood loss: none. Procedure:                Pre-Anesthesia Assessment:                           - Prior to the procedure, a History and Physical                            was performed, and patient medications, allergies                            and sensitivities were reviewed. The patient's                            tolerance of previous anesthesia was reviewed.                           - The risks and benefits of the procedure and the                            sedation options and risks were discussed with the                            patient. All questions were answered and informed                            consent was obtained.                           - ASA Grade Assessment: III - A patient with severe                            systemic disease.                           After obtaining informed consent, the endoscope was                            passed under direct vision. Throughout  the                            procedure, the patient's blood pressure, pulse, and                            oxygen saturations were monitored continuously. The                            [id number]-[id number] ([id number] scope was introduced through the                            mouth and advanced to the second part of duodenum.                            The [id number]) scope  was introduced                            through the mouth and advanced to the proximal                            jejunum. The small bowel enteroscopy was                            accomplished without difficulty. The patient                            tolerated the procedure well. Findings:      The esophagus was normal.      Six 4 to 6 mm angiodysplastic lesions with no bleeding were found in the       gastric body. There was some evidence of previous bleeding as small       amount of old hematin was present in the gastric chamber. Coagulation       for bleeding prevention using argon plasma at 0.3 liters/minute and 20       watts was successful.      Four angiodysplastic lesions with no bleeding were found in the second       portion of the duodenum, in the third portion of the duodenum and in the       fourth portion of the duodenum. Coagulation for bleeding prevention       using argon plasma at 0.3 liters/minute and 20 watts was successful.      Two angiodysplastic lesions with no bleeding were found in the proximal       jejunum. Coagulation for bleeding prevention using argon plasma at 0.3       liters/minute and 20 watts was successful. Impression:               - Normal esophagus.                           - Six non-bleeding angiodysplastic lesions in the                            stomach. Treated with argon plasma coagulation                            (  APC).                           - Four non-bleeding angiodysplastic lesions in the                            duodenum. Treated with argon plasma coagulation                            (APC).                           - Two non-bleeding angiodysplastic lesions in the                            jejunum. Treated with argon plasma coagulation                            (APC).                           - No specimens collected. Moderate Sedation:      Per Anesthesia Care Recommendation:           - Return patient to hospital  ward for ongoing care.                           - Soft diet today.                           - Pantoprazole 40 mg twice a day PO.                           - May consider restarting anticoagulation tomorrow                            if H/H is stable.                           - H/H daily. Procedure Code(s):        --- Professional ---                           [phone], Small intestinal endoscopy, enteroscopy                            beyond second portion of duodenum, not including                            ileum; with control of bleeding (eg, injection,                            bipolar cautery, unipolar cautery, laser, heater                            probe, stapler, plasma coagulator) Diagnosis Code(s):        --- Professional ---  K31.819, Angiodysplasia of stomach and duodenum                            without bleeding                           K55.20, Angiodysplasia of colon without hemorrhage                           D50.9, Iron deficiency anemia, unspecified CPT copyright 2019 American Medical Association. All rights reserved. The codes documented in this report are preliminary and upon coder review may  be revised to meet current compliance requirements. Maylon Peppers, MD Maylon Peppers,  10/11/2021 1:38:02 PM This report has been signed electronically. Number of Addenda: 0

## 2021-10-11 NOTE — Progress Notes (Addendum)
Pt off unit for procedure.

## 2021-10-11 NOTE — Anesthesia Preprocedure Evaluation (Signed)
Anesthesia Evaluation  Patient identified by MRN, date of birth, ID band Patient awake    Reviewed: Allergy & Precautions, NPO status , Patient's Chart, lab work & pertinent test results  History of Anesthesia Complications Negative for: history of anesthetic complications  Airway Mallampati: II  TM Distance: >3 FB Neck ROM: Full    Dental  (+) Edentulous Upper, Edentulous Lower   Pulmonary shortness of breath and with exertion, asthma , sleep apnea , COPD,  COPD inhaler, Current Smoker and Patient abstained from smoking.,  Non-small cell lung cancer, right ([place]) 04/18/2016     + wheezing      Cardiovascular Exercise Tolerance: Poor hypertension, Pt. on medications + Peripheral Vascular Disease and +CHF  Normal cardiovascular exam+ dysrhythmias Atrial Fibrillation  Rhythm:Regular Rate:Normal     Neuro/Psych PSYCHIATRIC DISORDERS Anxiety Depression negative neurological ROS     GI/Hepatic Neg liver ROS, PUD, GERD  Medicated and Controlled,  Endo/Other  diabetes, Well Controlled, Type 2, Oral Hypoglycemic AgentsHypothyroidism   Renal/GU negative Renal ROS  negative genitourinary   Musculoskeletal  (+) Arthritis , Osteoarthritis,    Abdominal   Peds negative pediatric ROS (+)  Hematology  (+) Blood dyscrasia, anemia ,   Anesthesia Other Findings   Reproductive/Obstetrics negative OB ROS                             Anesthesia Physical Anesthesia Plan  ASA: 4  Anesthesia Plan: General   Post-op Pain Management: Minimal or no pain anticipated   Induction: Intravenous  PONV Risk Score and Plan: Propofol infusion  Airway Management Planned: Nasal Cannula and Natural Airway  Additional Equipment:   Intra-op Plan:   Post-operative Plan:   Informed Consent: I have reviewed the patients History and Physical, chart, labs and discussed the procedure including the risks, benefits and  alternatives for the proposed anesthesia with the patient or authorized representative who has indicated his/her understanding and acceptance.       Plan Discussed with: CRNA and Surgeon  Anesthesia Plan Comments:         Anesthesia Quick Evaluation

## 2021-10-11 NOTE — Care Management Obs Status (Signed)
[place] NOTIFICATION   Patient Details  Name: John [name] MRN: [id number] Date of Birth: 07-Oct-1945   Medicare Observation Status Notification Given:  Yes    Boneta Lucks, RN 10/11/2021, 3:08 PM

## 2021-10-12 DIAGNOSIS — K5900 Constipation, unspecified: Secondary | ICD-10-CM

## 2021-10-12 DIAGNOSIS — D649 Anemia, unspecified: Secondary | ICD-10-CM | POA: Diagnosis not present

## 2021-10-12 DIAGNOSIS — K922 Gastrointestinal hemorrhage, unspecified: Secondary | ICD-10-CM | POA: Diagnosis not present

## 2021-10-12 DIAGNOSIS — I483 Typical atrial flutter: Secondary | ICD-10-CM | POA: Diagnosis not present

## 2021-10-12 DIAGNOSIS — J9611 Chronic respiratory failure with hypoxia: Secondary | ICD-10-CM | POA: Diagnosis not present

## 2021-10-12 LAB — BASIC METABOLIC PANEL
Anion gap: 7 (ref 5–15)
BUN: 14 mg/dL (ref 8–23)
CO2: 33 mmol/L — ABNORMAL HIGH (ref 22–32)
Calcium: 8.2 mg/dL — ABNORMAL LOW (ref 8.9–10.3)
Chloride: 97 mmol/L — ABNORMAL LOW (ref 98–111)
Creatinine, Ser: 0.85 mg/dL (ref 0.61–1.24)
GFR, Estimated: >60 mL/min (ref >60)
Glucose, Bld: 143 mg/dL — ABNORMAL HIGH (ref 70–99)
Potassium: 3.4 mmol/L — ABNORMAL LOW (ref 3.5–5.1)
Sodium: 137 mmol/L (ref 135–145)

## 2021-10-12 LAB — CBC
HCT: 28.9 % — ABNORMAL LOW (ref 39.0–52.0)
Hemoglobin: 8.8 g/dL — ABNORMAL LOW (ref 13.0–17.0)
MCH: 37.1 pg — ABNORMAL HIGH (ref 26.0–34.0)
MCHC: 30.4 g/dL (ref 30.0–36.0)
MCV: 121.9 fL — ABNORMAL HIGH (ref 80.0–100.0)
Platelets: 345 10*3/uL (ref 150–400)
RBC: 2.37 MIL/uL — ABNORMAL LOW (ref 4.22–5.81)
RDW: 21.7 % — ABNORMAL HIGH (ref 11.5–15.5)
WBC: 10.2 10*3/uL (ref 4.0–10.5)
nRBC: 0 % (ref 0.0–0.2)

## 2021-10-12 MED ORDER — METOLAZONE 2.5 MG PO TABS: 2.5000 mg | ORAL_TABLET | ORAL | 0 refills | Status: DC

## 2021-10-12 MED ORDER — LINACLOTIDE 72 MCG PO CAPS: 72.0000 ug | ORAL_CAPSULE | Freq: Every day | ORAL | Status: DC | PRN

## 2021-10-12 MED ORDER — HEPARIN SOD (PORK) LOCK FLUSH 100 UNIT/ML IV SOLN
500.0000 [IU] | Freq: Once | INTRAVENOUS | Status: AC
  Administered 2021-10-12: 500 [IU] via INTRAVENOUS
  Filled 2021-10-12: qty 5

## 2021-10-12 MED ORDER — PANTOPRAZOLE SODIUM 40 MG PO TBEC: 40.0000 mg | DELAYED_RELEASE_TABLET | Freq: Two times a day (BID) | ORAL | 5 refills | Status: DC

## 2021-10-12 NOTE — Progress Notes (Signed)
   10/12/21 1511  ReDS Vest / Clip  Station Marker C  Ruler Value 24.5  ReDS Value Range < 36  ReDS Actual Value 28

## 2021-10-12 NOTE — Progress Notes (Signed)
Port hep locked and deaccessed.  No bleeding at site., Bandaid applied

## 2021-10-12 NOTE — Discharge Summary (Signed)
Physician Discharge Summary  John [name] MRN:[id number] DOB: 12/16/1945 DOA: 10/10/2021  PCP: Hawks, Christy A, FNP  Admit date: 10/10/2021 Discharge date: 10/12/2021  Admitted From: Home Disposition: Home output  Recommendations for Outpatient Follow-up:  Follow up with PCP in 1-2 weeks Please obtain BMP/CBC in one week Outpatient follow-up with GI to be scheduled by GI He also plans to follow-up with cardiology   Discharge Condition: Stable CODE STATUS: Full code Diet recommendation: Heart healthy, carb modified  Brief/Interim Summary: John [name] is a 75 y.o. male with medical history significant of atrial flutter on anticoagulation, COPD, non-small cell lung cancer, chronic respiratory failure with hypoxia, obstructive sleep apnea, hypothyroidism, who was recently admitted to the hospital with GI bleeding.  He had a colonic AVM which was treated with APC.  He has been continued on anticoagulation for atrial flutter.  He had gone to his cardiology follow-up yesterday and had lab work drawn that showed hemoglobin of 7.  He did describe having increasing weakness/fatigue over the past few weeks.  When asked about dark stools, he says that he takes iron and his stools are always dark.  He has not noticed any fresh blood in stools.  He has not had any chest pain, cough, fever, dysuria.   ED Course: He was evaluated the emergency room where rectal exam showed melanotic stools that was heme positive.  Noted to be significantly orthostatic with blood pressure down in the 80s on standing.  Hemoglobin was noted to be low at 7.4 and he was transfused 1 unit of PRBC.  GI was consulted  Discharge Diagnoses:  Principal Problem:   GI bleed Active Problems:   Acute on chronic anemia   COPD (chronic obstructive pulmonary disease) (HCC)   Type 2 diabetes mellitus with hyperglycemia, without long-term current use of insulin (HCC)   Chronic respiratory failure with hypoxia (HCC)    Hypothyroidism   Non-small cell lung cancer, right (HCC)   PAD (peripheral artery disease) (HCC)   Obstructive sleep apnea treated with continuous positive airway pressure (CPAP)   Atrial flutter (HCC)   GIB (gastrointestinal bleeding)   Lethargy  Acute on chronic anemia likely secondary to chronic GI blood loss -Started on Protonix -He has been transfused 1 unit of PRBC -Follow-up CBC showed improvement of hemoglobin -GI consulted -Patient underwent push enteroscopy, gastric/duodenal AVMs treated with APC -Hemoglobin has since remained stable   Lethargy -Appears to have resolved -ABG, ammonia unrevealing   Chronic diastolic heart failure -Started on IV Lasix today since he does have some lower extremity edema  -ReDS clip reading 28% -We will transition to diuretic regimen per cardiology notes of torsemide 40 twice daily, Aldactone as well as metolazone 3 times a week -He will plan on following up with cardiology   Obstructive sleep apnea -Continue on CPAP   COPD -Continue on Trelegy -Bronchodilators as needed   Atrial flutter -CHA2DS2-VASc of 5 -Heart rate currently stable -Based on discussion that patient had with his cardiologist on day prior to admission, seems to be too high risk to continue on anticoagulation.  Would hold further anticoagulation for now until he can follow-up with cardiology. -May need to be considered for Watchman procedure   PAD -Status post right common iliac stent in 2022 -Restart aspirin on discharge   Hyperlipidemia -Continue statin   Hypothyroidism -Continue Synthroid   Non-small cell lung cancer -Status post chemo, radiation in 2018/2019  Discharge Instructions  Discharge Instructions     Diet - low sodium   heart healthy   Complete by: As directed    Increase activity slowly   Complete by: As directed       Allergies as of 10/12/2021       Reactions   Sulfa Antibiotics Swelling   Mouth swelling   Atorvastatin Other (See  Comments)   Muscle aches - tolerating Pravastatin 40 mg MWF   Jardiance [empagliflozin] Other (See Comments)   FEELS SLUGGISH, TIRED   Lopressor [metoprolol] Other (See Comments)   Fatigue   Rosuvastatin Other (See Comments)   Muscle aches - tolerating Pravastatin 40 mg MWF   Doxycycline Nausea Only   Temazepam Other (See Comments)   Made insomnia worse         Medication List     STOP taking these medications    Eliquis 5 MG Tabs tablet Generic drug: apixaban       TAKE these medications    B-12 PO Take 1 tablet by mouth daily.   famotidine 20 MG tablet Commonly known as: Pepcid One after supper What changed:  how much to take how to take this when to take this additional instructions   fluticasone 50 MCG/ACT nasal spray Commonly known as: FLONASE PLACE 1 SPRAY INTO BOTH NOSTRILS DAILY AS NEEDED FOR ALLERGIES OR RHINITIS.   gabapentin 300 MG capsule Commonly known as: NEURONTIN TAKE 1 CAPSULE BY MOUTH THREE TIMES A DAY What changed: See the new instructions.   Iron (Ferrous Sulfate) 325 (65 Fe) MG Tabs Take 325 mg by mouth 2 (two) times daily. What changed: when to take this   Klor-Con M20 20 MEQ tablet Generic drug: potassium chloride SA TAKE 1 TABLET BY MOUTH EVERY DAY What changed: how much to take   levothyroxine 50 MCG tablet Commonly known as: SYNTHROID TAKE 1 TABLET BY MOUTH EVERY DAY What changed: when to take this   linaclotide 72 MCG capsule Commonly known as: Linzess Take 1 capsule (72 mcg total) by mouth daily as needed (constipation).   Melatonin 10 MG Tabs Take 10 mg by mouth at bedtime as needed (sleep).   metolazone 2.5 MG tablet Commonly known as: ZAROXOLYN Take 1 tablet (2.5 mg total) by mouth 3 (three) times a week. Start taking on: October 14, 2021   NON FORMULARY CPAP at bedtime   ondansetron 4 MG tablet Commonly known as: Zofran Take 1 tablet (4 mg total) by mouth every 8 (eight) hours as needed for nausea or  vomiting.   OneTouch Delica Lancets 14H Misc Use to test blood sugars daily as directed. DX: E11.9   OneTouch Verio Reflect w/Device Kit Use to test blood sugars daily as directed. DX: E11.9   OneTouch Verio test strip Generic drug: glucose blood USE TO TEST BLOOD SUGARS DAILY AS DIRECTED. DX: E11.9   OXYGEN Inhale 2 L into the lungs continuous.   pantoprazole 40 MG tablet Commonly known as: Protonix Take 1 tablet (40 mg total) by mouth 2 (two) times daily before a meal. What changed: when to take this   pravastatin 40 MG tablet Commonly known as: PRAVACHOL TAKE 1 TABLET (40 MG TOTAL) BY MOUTH ON MONDAYS , WEDNESDAYS, AND FRIDAYS AS DIRECTED What changed: See the new instructions.   Rybelsus 7 MG Tabs Generic drug: Semaglutide Take 7 mg by mouth daily.   spironolactone 25 MG tablet Commonly known as: ALDACTONE Take 1 tablet (25 mg total) by mouth daily.   torsemide 20 MG tablet Commonly known as: DEMADEX Take 2 tablets (40 mg) in the morning,  take 2 tablets (40 mg) in the evening daily as needed.   Trelegy Ellipta 100-62.5-25 MCG/ACT Aepb Generic drug: Fluticasone-Umeclidin-Vilant Inhale 1 puff into the lungs daily.   zolpidem 5 MG tablet Commonly known as: AMBIEN Take 5 mg by mouth at bedtime as needed for sleep.        Follow-up Information     Irene Shipper, MD. Schedule an appointment as soon as possible for a visit in 2 week(s).   Specialty: Gastroenterology Contact information: [address] [address] [phone]         O'Neal, Cassie Freer, MD. Schedule an appointment as soon as possible for a visit in 2 week(s).   Specialties: Cardiology, Internal Medicine, Radiology Contact information: [place] [address] [phone]                Allergies  Allergen Reactions   Sulfa Antibiotics Swelling    Mouth swelling   Atorvastatin Other (See Comments)    Muscle aches - tolerating Pravastatin 40 mg  MWF   Jardiance [Empagliflozin] Other (See Comments)    FEELS SLUGGISH, TIRED   Lopressor [Metoprolol] Other (See Comments)    Fatigue   Rosuvastatin Other (See Comments)    Muscle aches - tolerating Pravastatin 40 mg MWF   Doxycycline Nausea Only   Temazepam Other (See Comments)    Made insomnia worse     Consultations: GI   Procedures/Studies: DG Chest Portable 1 View  Result Date: 10/10/2021 CLINICAL DATA:  Rales, weakness with dizziness EXAM: PORTABLE CHEST 1 VIEW COMPARISON:  Radiograph 09/13/2021, chest CT 06/19/2021 FINDINGS: Chest port catheter tip overlies the mid superior vena cava. Unchanged cardiomediastinal silhouette. There are post treatment changes in the right perihilar lung and left upper lung. Increased prominence of the interstitium. There is no new airspace consolidation. No pleural effusion. No pneumothorax. No acute osseous abnormality. Bilateral shoulder degenerative changes. Thoracic spondylosis. IMPRESSION: Post treatment changes in the right perihilar lung and left upper lung. Increased prominence of the interstitium bilaterally, could reflect interstitial edema or atypical infectious/inflammatory process. Electronically Signed   By: Maurine Simmering M.D.   On: 10/10/2021 12:52   ECHOCARDIOGRAM COMPLETE  Result Date: 09/15/2021    ECHOCARDIOGRAM REPORT   Patient Name:   FAROUK VIVERO Date of Exam: 09/15/2021 Medical Rec #:  [id number]        Height:       66.0 in Accession #:    [id number]       Weight:       164.8 lb Date of Birth:  18-Apr-1945        BSA:          1.842 m Patient Age:    19 years         BP:           117/45 mmHg Patient Gender: M                HR:           79 bpm. Exam Location:  Inpatient Procedure: 2D Echo, Cardiac Doppler, Color Doppler and Intracardiac            Opacification Agent Indications:    CHF  History:        Patient has prior history of Echocardiogram examinations, most                 recent 08/21/2020. COPD; Risk Factors:Hypertension,  Diabetes and  Sleep Apnea.  Sonographer:    Jefferey Pica Referring Phys: [id number] Cruger  1. Aortic valve calcified with restricted motion but no AS by doppler.  2. Left ventricular ejection fraction, by estimation, is 55 to 60%. The left ventricle has normal function. The left ventricle has no regional wall motion abnormalities. Left ventricular diastolic parameters are indeterminate.  3. Right ventricular systolic function is normal. The right ventricular size is normal. There is mildly elevated pulmonary artery systolic pressure.  4. Left atrial size was mildly dilated.  5. The mitral valve is normal in structure. No evidence of mitral valve regurgitation. No evidence of mitral stenosis. Moderate mitral annular calcification.  6. The aortic valve is calcified. Aortic valve regurgitation is trivial. Aortic valve sclerosis/calcification is present, without any evidence of aortic stenosis.  7. The inferior vena cava is dilated in size with <50% respiratory variability, suggesting right atrial pressure of 15 mmHg. FINDINGS  Left Ventricle: Left ventricular ejection fraction, by estimation, is 55 to 60%. The left ventricle has normal function. The left ventricle has no regional wall motion abnormalities. The left ventricular internal cavity size was normal in size. There is  no left ventricular hypertrophy. Left ventricular diastolic parameters are indeterminate. Right Ventricle: The right ventricular size is normal. There is mildly elevated pulmonary artery systolic pressure. The tricuspid regurgitant velocity is 2.50 m/s, and with an assumed right atrial pressure of 15 mmHg, the estimated right ventricular systolic pressure is 75.9 mmHg. Left Atrium: Left atrial size was mildly dilated. Right Atrium: Right atrial size was normal in size. Pericardium: There is no evidence of pericardial effusion. Mitral Valve: The mitral valve is normal in structure. Moderate mitral annular  calcification. No evidence of mitral valve regurgitation. No evidence of mitral valve stenosis. MV peak gradient, 9.1 mmHg. The mean mitral valve gradient is 3.0 mmHg. Tricuspid Valve: The tricuspid valve is normal in structure. Tricuspid valve regurgitation is trivial. No evidence of tricuspid stenosis. Aortic Valve: The aortic valve is calcified. Aortic valve regurgitation is trivial. Aortic valve sclerosis/calcification is present, without any evidence of aortic stenosis. Aortic valve peak gradient measures 11.5 mmHg. Pulmonic Valve: The pulmonic valve was normal in structure. Pulmonic valve regurgitation is not visualized. No evidence of pulmonic stenosis. Aorta: The aortic root is normal in size and structure. Venous: The inferior vena cava is dilated in size with less than 50% respiratory variability, suggesting right atrial pressure of 15 mmHg. IAS/Shunts: No atrial level shunt detected by color flow Doppler. Additional Comments: Aortic valve calcified with restricted motion but no AS by doppler.  LEFT VENTRICLE PLAX 2D LVIDd:         4.00 cm   Diastology LVIDs:         3.40 cm   LV e' lateral:   8.03 cm/s LV PW:         0.90 cm   LV E/e' lateral: 19.8 LV IVS:        0.90 cm LVOT diam:     1.90 cm LV SV:         67 LV SV Index:   36 LVOT Area:     2.84 cm  IVC IVC diam: 2.50 cm LEFT ATRIUM             Index        RIGHT ATRIUM           Index LA diam:        4.20 cm 2.28 cm/m   RA  Area:     16.70 cm LA Vol (A2C):   71.7 ml 38.93 ml/m  RA Volume:   42.50 ml  23.07 ml/m LA Vol (A4C):   56.3 ml 30.57 ml/m LA Biplane Vol: 65.1 ml 35.34 ml/m  AORTIC VALVE                 PULMONIC VALVE AV Area (Vmax): 1.87 cm     PV Vmax:       1.02 m/s AV Vmax:        169.50 cm/s  PV Peak grad:  4.2 mmHg AV Peak Grad:   11.5 mmHg LVOT Vmax:      112.00 cm/s LVOT Vmean:     70.200 cm/s LVOT VTI:       0.237 m  AORTA Ao Root diam: 3.20 cm MITRAL VALVE                TRICUSPID VALVE MV Area (PHT): 5.97 cm     TR Peak grad:    25.0 mmHg MV Area VTI:   2.20 cm     TR Vmax:        250.00 cm/s MV Peak grad:  9.1 mmHg MV Mean grad:  3.0 mmHg     SHUNTS MV Vmax:       1.51 m/s     Systemic VTI:  0.24 m MV Vmean:      81.0 cm/s    Systemic Diam: 1.90 cm MV Decel Time: 127 msec MV E velocity: 159.00 cm/s MV A velocity: 60.60 cm/s MV E/A ratio:  2.62 Brian Crenshaw MD Electronically signed by Brian Crenshaw MD Signature Date/Time: 09/15/2021/11:42:39 AM    Final    CT Abdomen Pelvis W Contrast  Result Date: 09/13/2021 CLINICAL DATA:  Abdominal pain. Acute nonlocalized. Bleeding ulcer. Hypotension. Low hemoglobin. Non-small cell lung cancer. * Tracking Code: BO * EXAM: CT ABDOMEN AND PELVIS WITH CONTRAST TECHNIQUE: Multidetector CT imaging of the abdomen and pelvis was performed using the standard protocol following bolus administration of intravenous contrast. RADIATION DOSE REDUCTION: This exam was performed according to the departmental dose-optimization program which includes automated exposure control, adjustment of the mA and/or kV according to patient size and/or use of iterative reconstruction technique. CONTRAST:  100mL OMNIPAQUE IOHEXOL 300 MG/ML  SOLN COMPARISON:  Chest CT 06/19/2021, PET-CT 08/13/2020 FINDINGS: Lower chest: 5 mm nodule in the LEFT lower lobe (image 1/4 present on comparison PET-CT scan from 08/26/2020. No change in size. New RIGHT pleural effusion compared to most recent CT chest Hepatobiliary: No focal hepatic lesion. No biliary duct dilatation. Common bile duct is normal. Pancreas: Pancreas is normal. No ductal dilatation. No pancreatic inflammation. Spleen: Normal spleen Adrenals/urinary tract: Adrenal glands normal. Kidneys ureters and bladder are unchanged. 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 No change from multiple comparison exams Stomach/Bowel: Stomach is normal. Duodenum small-bowel normal. Appendix normal. Ascending, transverse and descending colon normal.  No evidence of gastrointestinal bleeding, inflammation, or infection the GI tract. Vascular/Lymphatic: Abdominal aorta is normal caliber with atherosclerotic calcification. There is no retroperitoneal or periportal lymphadenopathy. No pelvic lymphadenopathy. Reproductive: TURP defect in the prostate gland Other: No intraperitoneal free fluid. Musculoskeletal: No aggressive osseous lesion. IMPRESSION: 1. No source of gastrointestinal bleeding identified. 2. No inflammation or infection identified in the GI tract. 3. New small RIGHT effusion. 4. Stable LEFT lobe pulmonary nodule. Electronically Signed   By: Stewart  Edmunds M.D.   On: 09/13/2021 21:43     DG Chest 2 View  Result Date: 09/13/2021 CLINICAL DATA:  Weakness EXAM: CHEST - 2 VIEW COMPARISON:  None Available. FINDINGS: Pulmonary insufflation is normal and symmetric. There is parenchymal scarring within the lung apices noted. Right perihilar pulmonary opacity appears more prominent and may reflect post radiation changes noted on CT examination of 06/19/2021, but is not optimally assessed on this examination. No pneumothorax or pleural effusion. Cardiac size within normal limits. Prominent mitral valve annular calcification noted. Pulmonary vascularity is normal. No acute bone abnormality. IMPRESSION: Progressive right posterior perihilar pulmonary opacity, possibly representing post radiation change. This could be better assessed with dedicated nonemergent contrast enhanced CT imaging No radiographic evidence of acute cardiopulmonary disease. Electronically Signed   By: Fidela Salisbury M.D.   On: 09/13/2021 19:38      Subjective: Feels better.  No new complaints  Discharge Exam: Vitals:   10/12/21 0331 10/12/21 0354 10/12/21 0844 10/12/21 1339  BP: (!) 97/36 (!) 130/55  115/60  Pulse: 86 85  87  Resp: 18   18  Temp: 97.6 F (36.4 C)   98.5 F (36.9 C)  TempSrc: Oral   Oral  SpO2: 100% 100% (!) 9% 100%  Weight:      Height:         General: Pt is alert, awake, not in acute distress Cardiovascular: RRR, S1/S2 +, no rubs, no gallops Respiratory: CTA bilaterally, no wheezing, no rhonchi Abdominal: Soft, NT, ND, bowel sounds + Extremities: no edema, no cyanosis    The results of significant diagnostics from this hospitalization (including imaging, microbiology, ancillary and laboratory) are listed below for reference.     Microbiology: Recent Results (from the past 240 hour(s))  Microscopic Examination     Status: None   Collection Time: 10/07/21 11:52 AM   Urine  Result Value Ref Range Status   WBC, UA None seen 0 - 5 /hpf Final   RBC, Urine None seen 0 - 2 /hpf Final   Epithelial Cells (non renal) None seen 0 - 10 /hpf Final   Renal Epithel, UA None seen None seen /hpf Final   Bacteria, UA None seen None seen/Few Final     Labs: BNP (last 3 results) Recent Labs    08/25/21 1716 09/13/21 2150 10/10/21 1705  BNP 407.4* 267.8* 096.0*   Basic Metabolic Panel: Recent Labs  Lab 10/09/21 0934 10/10/21 1121 10/10/21 1216 10/11/21 0441 10/12/21 0442  NA 132* 133* 132* 135 137  K 4.8 3.6 3.6 3.5 3.4*  CL 93* 93* 90* 98 97*  CO2 24 30  --  29 33*  GLUCOSE 208* 190* 165* 104* 143*  BUN 27 29* 26* 19 14  CREATININE 1.12 1.17 1.20 0.84 0.85  CALCIUM 8.7 8.3*  --  8.3* 8.2*   Liver Function Tests: Recent Labs  Lab 10/10/21 1121 10/11/21 0441  AST 26 21  ALT 26 20  ALKPHOS 126 107  BILITOT 1.0 1.1  PROT 6.2* 5.5*  ALBUMIN 3.2* 2.7*   No results for input(s): "LIPASE", "AMYLASE" in the last 168 hours. Recent Labs  Lab 10/10/21 1705  AMMONIA 14   CBC: Recent Labs  Lab 10/09/21 0934 10/10/21 1121 10/10/21 1216 10/10/21 1705 10/11/21 0441 10/12/21 0442  WBC 10.3 8.6  --  7.5 8.8 10.2  HGB 7.0* 7.4* 9.2* 8.1* 8.4* 8.8*  HCT 22.4* 25.4* 27.0* 27.2* 28.6* 28.9*  MCV 116* 125.7*  --  123.1* 123.3* 121.9*  PLT 329 362  --  312 321 345  Cardiac Enzymes: No results for input(s):  "CKTOTAL", "CKMB", "CKMBINDEX", "TROPONINI" in the last 168 hours. BNP: Invalid input(s): "POCBNP" CBG: Recent Labs  Lab 10/11/21 1234  GLUCAP 126*   D-Dimer No results for input(s): "DDIMER" in the last 72 hours. Hgb A1c No results for input(s): "HGBA1C" in the last 72 hours. Lipid Profile No results for input(s): "CHOL", "HDL", "LDLCALC", "TRIG", "CHOLHDL", "LDLDIRECT" in the last 72 hours. Thyroid function studies No results for input(s): "TSH", "T4TOTAL", "T3FREE", "THYROIDAB" in the last 72 hours.  Invalid input(s): "FREET3" Anemia work up No results for input(s): "VITAMINB12", "FOLATE", "FERRITIN", "TIBC", "IRON", "RETICCTPCT" in the last 72 hours. Urinalysis    Component Value Date/Time   COLORURINE STRAW (A) 08/14/2020 1122   APPEARANCEUR Clear 10/07/2021 1152   LABSPEC 1.008 08/14/2020 1122   PHURINE 7.0 08/14/2020 1122   GLUCOSEU Negative 10/07/2021 1152   HGBUR NEGATIVE 08/14/2020 1122   BILIRUBINUR Negative 10/07/2021 1152   KETONESUR NEGATIVE 08/14/2020 1122   PROTEINUR Negative 10/07/2021 1152   PROTEINUR NEGATIVE 08/14/2020 1122   NITRITE Negative 10/07/2021 1152   NITRITE NEGATIVE 08/14/2020 1122   LEUKOCYTESUR Negative 10/07/2021 1152   LEUKOCYTESUR NEGATIVE 08/14/2020 1122   Sepsis Labs Recent Labs  Lab 10/10/21 1121 10/10/21 1705 10/11/21 0441 10/12/21 0442  WBC 8.6 7.5 8.8 10.2   Microbiology Recent Results (from the past 240 hour(s))  Microscopic Examination     Status: None   Collection Time: 10/07/21 11:52 AM   Urine  Result Value Ref Range Status   WBC, UA None seen 0 - 5 /hpf Final   RBC, Urine None seen 0 - 2 /hpf Final   Epithelial Cells (non renal) None seen 0 - 10 /hpf Final   Renal Epithel, UA None seen None seen /hpf Final   Bacteria, UA None seen None seen/Few Final     Time coordinating discharge: 35mins  SIGNED:   Jehanzeb Memon, MD  [facility] 10/12/2021, 8:23 PM   If 7PM-7AM, please contact  night-coverage [email or web address]  

## 2021-10-12 NOTE — Progress Notes (Signed)
John [name], M.D. Gastroenterology & Hepatology   Interval History:  No acute events overnight. The patient reports that he is feeling much better today and is more awake.  Denies any shortness of breath or chest pain.  No melena or hematochezia.  Hemoglobin has remained stable at 8.8, BUN low 14. Tolerated diet adequately.  Inpatient Medications:  Current Facility-Administered Medications:    0.9 %  sodium chloride infusion, 10 mL/hr, Intravenous, Once, Kathie Dike, MD   acetaminophen (TYLENOL) tablet 650 mg, 650 mg, Oral, Q6H PRN **OR** acetaminophen (TYLENOL) suppository 650 mg, 650 mg, Rectal, Q6H PRN, Kathie Dike, MD   albuterol (PROVENTIL) (2.5 MG/3ML) 0.083% nebulizer solution 2.5 mg, 2.5 mg, Nebulization, Q6H PRN, Kathie Dike, MD   Chlorhexidine Gluconate Cloth 2 % PADS 6 each, 6 each, Topical, Daily, Kathie Dike, MD, 6 each at 10/12/21 0929   fluticasone furoate-vilanterol (BREO ELLIPTA) 100-25 MCG/ACT 1 puff, 1 puff, Inhalation, Daily, 1 puff at 10/12/21 0844 **AND** umeclidinium bromide (INCRUSE ELLIPTA) 62.5 MCG/ACT 1 puff, 1 puff, Inhalation, Daily, Memon, Jolaine Artist, MD, 1 puff at 10/12/21 0844   furosemide (LASIX) injection 80 mg, 80 mg, Intravenous, BID, Kathie Dike, MD, 80 mg at 10/12/21 0931   levothyroxine (SYNTHROID) tablet 50 mcg, 50 mcg, Oral, QAC breakfast, Kathie Dike, MD, 50 mcg at 10/12/21 0518   ondansetron (ZOFRAN) tablet 4 mg, 4 mg, Oral, Q6H PRN **OR** ondansetron (ZOFRAN) injection 4 mg, 4 mg, Intravenous, Q6H PRN, Roderic Palau, Jehanzeb, MD   pantoprazole (PROTONIX) injection 40 mg, 40 mg, Intravenous, Q12H, Memon, Jehanzeb, MD, 40 mg at 10/12/21 0928   pravastatin (PRAVACHOL) tablet 40 mg, 40 mg, Oral, Once per day on Mon Wed Fri, Memon, Jolaine Artist, MD   I/O    Intake/Output Summary (Last 24 hours) at 10/12/2021 1025 Last data filed at 10/12/2021 0900 Gross per 24 hour  Intake 605 ml  Output --  Net 605 ml     Physical Exam: Temp:   [97.5 F (36.4 C)-98.1 F (36.7 C)] 97.6 F (36.4 C) (07/08 0331) Pulse Rate:  [84-107] 85 (07/08 0354) Resp:  [15-20] 18 (07/08 0331) BP: (97-140)/(36-58) 130/55 (07/08 0354) SpO2:  [91 %-100 %] 100 % (07/08 0354)  Temp (24hrs), Avg:97.7 F (36.5 C), Min:97.5 F (36.4 C), Max:98.1 F (36.7 C) GENERAL: The patient is AO x3, in no acute distress. On [place]. HEENT: Head is normocephalic and atraumatic. EOMI are intact. Mouth is well hydrated and without lesions. NECK: Supple. No masses LUNGS: Clear to auscultation. No presence of rhonchi/wheezing/rales. Adequate chest expansion HEART: RRR, normal s1 and s2. ABDOMEN: Soft, nontender, no guarding, no peritoneal signs, and nondistended. BS +. No masses. EXTREMITIES: Without any cyanosis, clubbing, rash, lesions or edema. NEUROLOGIC: AOx3, no focal motor deficit. SKIN: no jaundice, no rashes  Laboratory Data: CBC:     Component Value Date/Time   WBC 10.2 10/12/2021 0442   RBC 2.37 (L) 10/12/2021 0442   HGB 8.8 (L) 10/12/2021 0442   HGB 7.0 (LL) 10/09/2021 0934   HCT 28.9 (L) 10/12/2021 0442   HCT 22.4 (L) 10/09/2021 0934   PLT 345 10/12/2021 0442   PLT 329 10/09/2021 0934   MCV 121.9 (H) 10/12/2021 0442   MCV 116 (H) 10/09/2021 0934   MCH 37.1 (H) 10/12/2021 0442   MCHC 30.4 10/12/2021 0442   RDW 21.7 (H) 10/12/2021 0442   RDW 16.5 (H) 10/09/2021 0934   LYMPHSABS 0.4 (L) 10/01/2021 1620   MONOABS 0.5 09/16/2021 0833   EOSABS 0.0 10/01/2021 1620   BASOSABS 0.0  10/01/2021 1620   COAG:  Lab Results  Component Value Date   INR 1.1 09/05/2020   INR 1.0 08/14/2020   INR 1.1 05/06/2020    BMP:     Latest Ref Rng & Units 10/12/2021    4:42 AM 10/11/2021    4:41 AM 10/10/2021   12:16 PM  BMP  Glucose 70 - 99 mg/dL 143  104  165   BUN 8 - 23 mg/dL 14  19  26    Creatinine 0.61 - 1.24 mg/dL 0.85  0.84  1.20   Sodium 135 - 145 mmol/L 137  135  132   Potassium 3.5 - 5.1 mmol/L 3.4  3.5  3.6   Chloride 98 - 111 mmol/L 97  98  90    CO2 22 - 32 mmol/L 33  29    Calcium 8.9 - 10.3 mg/dL 8.2  8.3      HEPATIC:     Latest Ref Rng & Units 10/11/2021    4:41 AM 10/10/2021   11:21 AM 09/26/2021    4:35 PM  Hepatic Function  Total Protein 6.5 - 8.1 g/dL 5.5  6.2  5.8   Albumin 3.5 - 5.0 g/dL 2.7  3.2  3.6   AST 15 - 41 U/L 21  26  20    ALT 0 - 44 U/L 20  26  19    Alk Phosphatase 38 - 126 U/L 107  126  143   Total Bilirubin 0.3 - 1.2 mg/dL 1.1  1.0  0.4     CARDIAC:  Lab Results  Component Value Date   CKTOTAL 27 (L) 08/05/2016      Imaging: I personally reviewed and interpreted the available labs, imaging and endoscopic files.   Assessment/Plan: 76 year old male with a complex past medical history non-small cell lung cancer, heart failure with preserved ejection fraction, COPD requiring oxygen supplementation, OSA, diabetes, hypothyroidism, peripheral artery disease status post stenting, atrial flutter on Eliquis and history of colonic AVMs, who was brought to the hospital after presenting low hemoglobin in outpatient labs.  The patient presented dropping his hemoglobin down to 7.0 for which he underwent blood transfusion with adequate response.  Underwent an endoscopic evaluation yesterday via push enteroscopy which showed presence of 6 AVMs in his stomach and 6 AVMs in the duodenum and jejunum, all the lesions were ablated successfully.  Hemoglobin today has remained stable without any further drop and no evidence of ongoing gastrointestinal bleeding.  At this point he should continue PPI twice daily and he will follow-up in the GI clinic.  I discussed that there is a possibility he may have more AVMs in the rest of his small bowel but as his hemoglobin is stable no further evaluation is warranted at this point.  If needed, it will be okay to restart his Eliquis today.  - Repeat CBC qday, transfuse if Hb <8 - Pantoprazole 40 mg q12h PO - 2 large bore IV lines - Active T/S - GI soft diet - Avoid NSAIDs - Patient will  follow up in GI clinic with [facility] - primary GI Dr. Henrene Pastor in next 2-3 weeks - GI service will sign-off, please call us back if you have any more questions.  John Peppers, MD Gastroenterology and Hepatology [facility]

## 2021-10-14 ENCOUNTER — Telehealth: Payer: Self-pay

## 2021-10-14 ENCOUNTER — Encounter: Payer: Self-pay | Admitting: Family

## 2021-10-14 ENCOUNTER — Ambulatory Visit (INDEPENDENT_AMBULATORY_CARE_PROVIDER_SITE_OTHER): Payer: Medicare Other | Admitting: Family

## 2021-10-14 VITALS — BP 124/52 | HR 84 | Temp 98.3°F | Ht 66.0 in | Wt 158.0 lb

## 2021-10-14 DIAGNOSIS — F5101 Primary insomnia: Secondary | ICD-10-CM | POA: Diagnosis not present

## 2021-10-14 DIAGNOSIS — K253 Acute gastric ulcer without hemorrhage or perforation: Secondary | ICD-10-CM

## 2021-10-14 DIAGNOSIS — I152 Hypertension secondary to endocrine disorders: Secondary | ICD-10-CM

## 2021-10-14 DIAGNOSIS — I5033 Acute on chronic diastolic (congestive) heart failure: Secondary | ICD-10-CM

## 2021-10-14 DIAGNOSIS — E1159 Type 2 diabetes mellitus with other circulatory complications: Secondary | ICD-10-CM

## 2021-10-14 DIAGNOSIS — Z09 Encounter for follow-up examination after completed treatment for conditions other than malignant neoplasm: Secondary | ICD-10-CM | POA: Diagnosis not present

## 2021-10-14 MED ORDER — ZOLPIDEM TARTRATE 5 MG PO TABS
5.0000 mg | ORAL_TABLET | Freq: Every evening | ORAL | 2 refills | Status: DC | PRN
Start: 2021-10-14 — End: 2021-10-15

## 2021-10-14 NOTE — Patient Instructions (Signed)
Gastrointestinal Bleeding Gastrointestinal (GI) bleeding is bleeding somewhere along the digestive tract, between the mouth and the anus. The digestive tract includes the mouth, esophagus, stomach, small intestine, large intestine, and anus. The large intestine is often called the colon. GI bleeding can be caused by various problems. The severity of these problems can be mild, serious, or life-threatening. If you have GI bleeding, you may find blood in your stools (feces), you may have black stools, or you may vomit blood. You may need to stay in the hospital if there is a lot of bleeding. What are the causes? This condition may be caused by: Inflammation, irritation, or swelling of the esophagus (esophagitis). The esophagus is part of the body that moves food from your mouth to your stomach. Swollen veins in the rectum (hemorrhoids). Tears in the anus (anal fissures). The tears are often caused by passing hard stool. Pouches that form on the colon and may bleed (diverticulosis). Inflammation in areas with diverticulosis. This is called diverticulitis.This can cause pain, fever, and bloody stools. Growths (polyps) or cancer. Colon cancer often starts out as precancerous polyps. Gastritis and ulcers. These may cause bleeding in the upper GI tract, near the stomach. What increases the risk? You are more likely to develop this condition if: You have an infection in your stomach from a type of bacteria called Helicobacter pylori. You take certain medicines, such as: NSAIDs. Aspirin. Selective serotonin reuptake inhibitors (SSRIs). Steroids. Antiplatelet or anticoagulant medicines. You smoke. You drink alcohol. What are the signs or symptoms? Common symptoms of this condition include: Bright red blood in your vomit, or vomit that looks like coffee grounds. Bloody, black, or tarry stools. Bleeding from the lower GI tract will usually cause red or maroon blood in the stools. Bleeding from the  upper GI tract may cause black, tarry stools that are often stronger smelling than usual. In certain cases, if the bleeding is fast enough, the stools may be red. Pain or cramping in the abdomen. How is this diagnosed? This condition may be diagnosed based on: Your medical history and a physical exam. Various tests, such as: Blood tests. Stool tests. X-rays and other imaging tests. Esophagogastroduodenoscopy (EGD). In this test, a flexible, lighted tube is used to look at your esophagus, stomach, and small intestine. Colonoscopy. In this test, a flexible, lighted tube is used to look at your colon. How is this treated? Treatment for this condition depends on the cause of the bleeding. For example: For bleeding from the esophagus, stomach, small intestine, or colon, the health care provider may do a procedure to stop bleeding during your EGD or colonoscopy. Inflammation or infection of the colon can be treated with medicines. Certain rectal problems can be treated with creams, suppositories, or warm baths. Medicines may be given to reduce acid in your stomach. Surgery is sometimes done. Blood transfusions are sometimes needed if a lot of blood has been lost. If there is a lot of bleeding, you will need to stay in the hospital for observation. If bleeding is mild, you may be allowed to go home. Follow these instructions at home:  Take over-the-counter and prescription medicines only as told by your health care provider. Eat foods that are high in fiber, such as beans, whole grains, and fresh fruits and vegetables. This will help to keep your stools soft. Eating 1-3 prunes each day works well for many people. Drink enough fluid to keep your urine pale yellow. Keep all follow-up visits. This is important. Contact a  health care provider if: Your symptoms do not improve with treatment. Get help right away if: Your bleeding does not stop. You feel light-headed or you faint. You feel  weak. You have severe cramps in your back or abdomen. You pass large blood clots in your stool. Your symptoms are getting worse. You have chest pain or fast heartbeats. These symptoms may be an emergency. Get help right away. Call 911. Do not wait to see if the symptoms will go away. Do not drive yourself to the hospital. Summary Gastrointestinal (GI) bleeding is bleeding somewhere along the digestive tract, between the mouth and anus. GI bleeding can be caused by various problems. Treatment for this condition depends on the cause of the bleeding. Take over-the-counter and prescription medicines only as told by your health care provider. Get help right away if your bleeding increases, your symptoms are getting worse, or you have new symptoms. Keep all follow-up visits. This is important. This information is not intended to replace advice given to you by your health care provider. Make sure you discuss any questions you have with your health care provider. Document Revised: 10/26/2020 Document Reviewed: 10/26/2020 Elsevier Patient Education  [employer].

## 2021-10-14 NOTE — Telephone Encounter (Signed)
Transition Care Management Unsuccessful Follow-up Telephone Call  Date of discharge and from where:  10/11/21 - [facility] - upper GI bleed  Attempts:  1st Attempt  Reason for unsuccessful TCM follow-up call:  Left voice message   Transition Care Management Unsuccessful Follow-up Telephone Call  Date of discharge and from where:  10/11/21 - [facility] - upper GI bleed/ anemia  Attempts:  2nd Attempt  Reason for unsuccessful TCM follow-up call:  Left voice message  Just realized patient is likely on the way to the office - has hospital f/u already scheduled for 10/14/21 @ 11:40

## 2021-10-14 NOTE — Progress Notes (Signed)
Subjective:    Patient ID: John [name], male    DOB: [birth year], 76 y.o.   MRN: [id number]  Chief Complaint  Patient presents with   Hospitalization Follow-up   Pt presents to the office today for hospital follow up. He went to the ED on 10/10/21 and discharged on 07/08. We sent him for low Hgb, hypotension, and dizziness. He had a EGD completed that showed to have a GI bleed. He has a follow up with GI.   He was given a 1 unit PRBC.   He also has CHF and was given IV lasix in hospital. He was discharge on torsemide 40 mg BID, aldactone and metolazone 2.5 mg TID a week. He has a follow up with Cardiologists.  Anemia Presents for follow-up visit. Symptoms include malaise/fatigue and pallor. There has been no leg swelling.  Congestive Heart Failure Presents for follow-up visit. Associated symptoms include edema, fatigue and shortness of breath. Pertinent negatives include no chest pain or chest pressure.  Hypertension This is a chronic problem. The current episode started more than 1 year ago. The problem has been resolved since onset. The problem is controlled. Associated symptoms include malaise/fatigue and shortness of breath. Pertinent negatives include no chest pain or peripheral edema. Risk factors for coronary artery disease include dyslipidemia, diabetes mellitus, obesity and male gender. The current treatment provides moderate improvement.  Insomnia Primary symptoms: difficulty falling asleep, frequent awakening, malaise/fatigue.   The current episode started more than one year. The onset quality is gradual. The problem occurs intermittently.      Review of Systems  Constitutional:  Positive for fatigue and malaise/fatigue.  Respiratory:  Positive for shortness of breath.   Cardiovascular:  Negative for chest pain.  Skin:  Positive for pallor.  Psychiatric/Behavioral:  The patient has insomnia.   All other systems reviewed and are negative.      Objective:    Physical Exam Vitals reviewed.  Constitutional:      General: He is not in acute distress.    Appearance: He is well-developed.  HENT:     Head: Normocephalic.     Right Ear: Tympanic membrane normal.     Left Ear: Tympanic membrane normal.  Eyes:     General:        Right eye: No discharge.        Left eye: No discharge.     Pupils: Pupils are equal, round, and reactive to light.  Neck:     Thyroid: No thyromegaly.  Cardiovascular:     Rate and Rhythm: Normal rate and regular rhythm.     Heart sounds: Normal heart sounds. No murmur heard. Pulmonary:     Effort: Pulmonary effort is normal. No respiratory distress.     Breath sounds: Normal breath sounds. No wheezing.  Abdominal:     General: Bowel sounds are normal. There is no distension.     Palpations: Abdomen is soft.     Tenderness: There is no abdominal tenderness.  Musculoskeletal:        General: No tenderness. Normal range of motion.     Cervical back: Normal range of motion and neck supple.     Right lower leg: Edema (2+) present.     Left lower leg: Edema (2+) present.  Skin:    General: Skin is warm and dry.     Coloration: Skin is pale.     Findings: No erythema or rash.  Neurological:     Mental Status: He is  alert and oriented to person, place, and time.     Cranial Nerves: No cranial nerve deficit.     Deep Tendon Reflexes: Reflexes are normal and symmetric.  Psychiatric:        Behavior: Behavior normal.        Thought Content: Thought content normal.        Judgment: Judgment normal.       BP (!) 124/52   Pulse 84   Temp 98.3 F (36.8 C)   Ht 5' 6"  (1.676 m)   Wt 158 lb (71.7 kg)   SpO2 94%   BMI 25.50 kg/m      Assessment & Plan:  John [name] comes in today with chief complaint of Hospitalization Follow-up   Diagnosis and orders addressed:  1. Acute on chronic diastolic CHF (congestive heart failure) (HCC) - CMP14+EGFR - CBC with Differential/Platelet  2. Acute gastric ulcer  without hemorrhage or perforation - CMP14+EGFR - CBC with Differential/Platelet  3. Hypertension associated with diabetes ([place]) - CMP14+EGFR - CBC with Differential/Platelet  4. Hospital discharge follow-up - CMP14+EGFR - CBC with Differential/Platelet  5. Primary insomnia Start Ambien  Sleep ritual  - zolpidem (AMBIEN) 5 MG tablet; Take 1 tablet (5 mg total) by mouth at bedtime as needed for sleep.  Dispense: 30 tablet; Refill: 2   Labs pending Health Maintenance reviewed Diet and exercise encouraged  Follow up plan: 3 months and keep appt with specialists   Evelina Dun, FNP

## 2021-10-15 ENCOUNTER — Encounter (HOSPITAL_COMMUNITY): Payer: Medicare Other

## 2021-10-15 ENCOUNTER — Ambulatory Visit: Payer: Medicare Other | Admitting: Vascular Surgery

## 2021-10-15 ENCOUNTER — Other Ambulatory Visit: Payer: Self-pay | Admitting: Cardiovascular Disease

## 2021-10-15 DIAGNOSIS — I152 Hypertension secondary to endocrine disorders: Secondary | ICD-10-CM | POA: Diagnosis not present

## 2021-10-15 DIAGNOSIS — C3491 Malignant neoplasm of unspecified part of right bronchus or lung: Secondary | ICD-10-CM | POA: Diagnosis not present

## 2021-10-15 DIAGNOSIS — J449 Chronic obstructive pulmonary disease, unspecified: Secondary | ICD-10-CM | POA: Diagnosis not present

## 2021-10-15 DIAGNOSIS — I483 Typical atrial flutter: Secondary | ICD-10-CM

## 2021-10-15 DIAGNOSIS — J9611 Chronic respiratory failure with hypoxia: Secondary | ICD-10-CM | POA: Diagnosis not present

## 2021-10-15 DIAGNOSIS — E1159 Type 2 diabetes mellitus with other circulatory complications: Secondary | ICD-10-CM | POA: Diagnosis not present

## 2021-10-15 DIAGNOSIS — I5033 Acute on chronic diastolic (congestive) heart failure: Secondary | ICD-10-CM | POA: Diagnosis not present

## 2021-10-15 LAB — CBC WITH DIFFERENTIAL/PLATELET
Basophils Absolute: 0.1 10*3/uL (ref 0.0–0.2)
Basos: 1 %
EOS (ABSOLUTE): 0.3 10*3/uL (ref 0.0–0.4)
Eos: 4 %
Hematocrit: 27.6 % — ABNORMAL LOW (ref 37.5–51.0)
Hemoglobin: 9.1 g/dL — ABNORMAL LOW (ref 13.0–17.7)
Immature Grans (Abs): 0.1 10*3/uL (ref 0.0–0.1)
Immature Granulocytes: 1 %
Lymphocytes Absolute: 0.3 10*3/uL — ABNORMAL LOW (ref 0.7–3.1)
Lymphs: 5 %
MCH: 36.4 pg — ABNORMAL HIGH (ref 26.6–33.0)
MCHC: 33 g/dL (ref 31.5–35.7)
MCV: 110 fL — ABNORMAL HIGH (ref 79–97)
Monocytes Absolute: 0.8 10*3/uL (ref 0.1–0.9)
Monocytes: 12 %
Neutrophils Absolute: 5.2 10*3/uL (ref 1.4–7.0)
Neutrophils: 77 %
Platelets: 326 10*3/uL (ref 150–450)
RBC: 2.5 x10E6/uL — CL (ref 4.14–5.80)
RDW: 15.5 % — ABNORMAL HIGH (ref 11.6–15.4)
WBC: 6.7 10*3/uL (ref 3.4–10.8)

## 2021-10-15 LAB — CMP14+EGFR
ALT: 19 IU/L (ref 0–44)
AST: 18 IU/L (ref 0–40)
Albumin/Globulin Ratio: 1.5 (ref 1.2–2.2)
Albumin: 3.6 g/dL — ABNORMAL LOW (ref 3.8–4.8)
Alkaline Phosphatase: 148 IU/L — ABNORMAL HIGH (ref 44–121)
BUN/Creatinine Ratio: 18 (ref 10–24)
BUN: 19 mg/dL (ref 8–27)
Bilirubin Total: 0.4 mg/dL (ref 0.0–1.2)
CO2: 30 mmol/L — ABNORMAL HIGH (ref 20–29)
Calcium: 8.6 mg/dL (ref 8.6–10.2)
Chloride: 91 mmol/L — ABNORMAL LOW (ref 96–106)
Creatinine, Ser: 1.06 mg/dL (ref 0.76–1.27)
Globulin, Total: 2.4 g/dL (ref 1.5–4.5)
Glucose: 157 mg/dL — ABNORMAL HIGH (ref 70–99)
Potassium: 4.1 mmol/L (ref 3.5–5.2)
Sodium: 140 mmol/L (ref 134–144)
Total Protein: 6 g/dL (ref 6.0–8.5)
eGFR: 73 mL/min/{1.73_m2} (ref >59)

## 2021-10-15 MED ORDER — ZOLPIDEM TARTRATE 5 MG PO TABS: 5.0000 mg | ORAL_TABLET | Freq: Every evening | ORAL | 2 refills | Status: DC | PRN

## 2021-10-15 NOTE — Telephone Encounter (Signed)
Eliquis 5mg  refill request received. Patient is 76 years old, weight-71.7kg, Crea-1.06 on 10/14/2021, Diagnosis-Aflutter, and last seen by Dr. Audie Box on 10/09/2021. Dose is appropriate based on dosing criteria. Will send in refill to requested pharmacy.

## 2021-10-15 NOTE — Addendum Note (Signed)
Addended by: Evelina Dun A on: 10/15/2021 10:33 AM   Modules accepted: Orders

## 2021-10-16 ENCOUNTER — Telehealth: Payer: Self-pay | Admitting: Cardiovascular Disease

## 2021-10-16 ENCOUNTER — Other Ambulatory Visit: Payer: Self-pay | Admitting: Cardiovascular Disease

## 2021-10-16 DIAGNOSIS — E1159 Type 2 diabetes mellitus with other circulatory complications: Secondary | ICD-10-CM | POA: Diagnosis not present

## 2021-10-16 DIAGNOSIS — C3491 Malignant neoplasm of unspecified part of right bronchus or lung: Secondary | ICD-10-CM | POA: Diagnosis not present

## 2021-10-16 DIAGNOSIS — I152 Hypertension secondary to endocrine disorders: Secondary | ICD-10-CM | POA: Diagnosis not present

## 2021-10-16 DIAGNOSIS — J9611 Chronic respiratory failure with hypoxia: Secondary | ICD-10-CM | POA: Diagnosis not present

## 2021-10-16 DIAGNOSIS — J449 Chronic obstructive pulmonary disease, unspecified: Secondary | ICD-10-CM | POA: Diagnosis not present

## 2021-10-16 DIAGNOSIS — I5033 Acute on chronic diastolic (congestive) heart failure: Secondary | ICD-10-CM | POA: Diagnosis not present

## 2021-10-16 NOTE — Telephone Encounter (Signed)
Spoke with patient of Dr. Audie Box He ws discharged on 7/8 He was advised to f/u in about 2 weeks ASA & Eliquis were stopped Scheduled for 7/14 @ 1:40pm with MD

## 2021-10-16 NOTE — Telephone Encounter (Signed)
Patient called and said some of his medications were changed when he was recently in the hospital. He wanted to talk to [name] about those changes. He can not want to wait until his appt August 8th

## 2021-10-17 ENCOUNTER — Inpatient Hospital Stay (HOSPITAL_COMMUNITY): Payer: Medicare Other | Attending: Hematology

## 2021-10-17 ENCOUNTER — Encounter (HOSPITAL_COMMUNITY): Payer: Medicare Other

## 2021-10-17 NOTE — Progress Notes (Signed)
Cardiology Office Note:   Date:  10/18/2021  NAME:  John [name]    MRN: [id number] DOB:  1945-06-07   PCP:  [name], FNP  Cardiologist:  Evalina Field, MD  Electrophysiologist:  None   Referring MD: [name], FNP   Chief Complaint  Patient presents with   Follow-up   History of Present Illness:   John [name] is a 76 y.o. male with a hx of HFpEF, Aflutter, anemia, COPD, tobacco abuse, GI AVM, who presents for follow-up.  He presents for follow-up.  Recent admission with multiple AVMs that underwent intervention.  Anticoagulation was not restarted.  He presents for follow-up today.  EKG shows she is in atrial flutter but is rate controlled.  His weight is down 12 pounds.  We will start him on metolazone.  We discussed that he does likely need a flutter ablation but this would require uninterrupted anticoagulation.  He has plans to see his outpatient gastroenterologist which will need to be expedited.  For now we will continue with his diuretics.  Seems to be controlling his volume status.  He is working on salt reduction.  His weights are improving.  Volume status is improving.  Denies any chest pain or trouble breathing.  Problem List 1. Coronary calcifications 2. Atrial flutter -Dx 08/21/2020 -CHADSVASC=5 (age, DM, PAD, HTN, CHF) -DCCV 10/05/2020 3. PAD -R femoral endarterectomy 08/20/2020 -R common iliac artery stent 08/20/2020 4. Non-small cell lung CA -2018 s/p chemo/radiation  -recurrent nodules without recurrence  5. COPD 6. Tobacco abuse 7. Gastric ulcer/Recurrent anemia -09/2021: radiation gastritis  -09/2021: colonic AVM -10/2021: gastric AVM/duodenal AVM 8. DM -A1c 4.7 9. HLD -T chol 126, HDL 28, LDL 80, TG 89 10. HFpEF 11. OSA  Past Medical History: Past Medical History:  Diagnosis Date   Adenocarcinoma of lung, right ([place]) 04/18/2016   Anxiety    Arthritis    Asthma    Atrial flutter (HCC)    BPH (benign prostatic hyperplasia)     with urinary retention 02/06/20   CHF (congestive heart failure) (HCC)    COPD (chronic obstructive pulmonary disease) (HCC)    Depression    Diabetes mellitus, type II ([place])    Dyspnea    History of kidney stones    History of radiation therapy    right lung 09/10/2020-09/17/2020   Dr Sondra Come   Hyperlipidemia    Hypertension    Hypothyroidism    Macular degeneration    Neuropathy    Non-small cell lung cancer, right ([place]) 04/18/2016   Peripheral vascular disease ([place])    Prostatitis    Pulmonary nodule, left 07/16/2016   Sleep apnea    cpap    Past Surgical History: Past Surgical History:  Procedure Laterality Date   ABDOMINAL AORTOGRAM W/LOWER EXTREMITY Left 02/06/2020   Procedure: ABDOMINAL AORTOGRAM W/LOWER EXTREMITY;  Surgeon: Lorretta Harp, MD;  Location: [facility];  Service: Cardiovascular;  Laterality: Left;   ABDOMINAL AORTOGRAM W/LOWER EXTREMITY N/A 05/30/2021   Procedure: ABDOMINAL AORTOGRAM W/LOWER EXTREMITY;  Surgeon: Marty Heck, MD;  Location: [facility];  Service: Cardiovascular;  Laterality: N/A;   BIOPSY  09/16/2021   Procedure: BIOPSY;  Surgeon: Gatha Mayer, MD;  Location: [facility];  Service: Gastroenterology;;   CARDIOVERSION N/A 10/05/2020   Procedure: CARDIOVERSION;  Surgeon: Geralynn Rile, MD;  Location: [facility] [place];  Service: Cardiovascular;  Laterality: N/A;   CATARACT EXTRACTION, BILATERAL Bilateral    COLONOSCOPY WITH PROPOFOL  N/A 06/20/2021   Procedure: COLONOSCOPY WITH PROPOFOL;  Surgeon: Irene Shipper, MD;  Location: [facility];  Service: Endoscopy;  Laterality: N/A;   COLONOSCOPY WITH PROPOFOL N/A 09/16/2021   Procedure: COLONOSCOPY WITH PROPOFOL;  Surgeon: Gatha Mayer, MD;  Location: [place];  Service: Gastroenterology;  Laterality: N/A;   ENDARTERECTOMY FEMORAL Right 08/20/2020   Procedure: ENDARTERECTOMY  RIGHT FEMORAL ARTERY;  Surgeon: Marty Heck, MD;  Location: [facility];  Service: Vascular;   Laterality: Right;   ENTEROSCOPY N/A 10/11/2021   Procedure: ENTEROSCOPY;  Surgeon: Harvel Quale, MD;  [facility];  Service: Gastroenterology;  Laterality: N/A;   ESOPHAGOGASTRODUODENOSCOPY N/A 09/16/2021   Procedure: ESOPHAGOGASTRODUODENOSCOPY (EGD);  Surgeon: Gatha Mayer, MD;  Location: [place];  Service: Gastroenterology;  Laterality: N/A;   ESOPHAGOGASTRODUODENOSCOPY (EGD) WITH PROPOFOL N/A 09/06/2020   Procedure: ESOPHAGOGASTRODUODENOSCOPY (EGD) WITH PROPOFOL;  Surgeon: Irene Shipper, MD;  Location: [facility];  Service: Endoscopy;  Laterality: N/A;   ESOPHAGOGASTRODUODENOSCOPY (EGD) WITH PROPOFOL N/A 10/11/2021   Procedure: ESOPHAGOGASTRODUODENOSCOPY (EGD) WITH PROPOFOL;  Surgeon: Harvel Quale, MD;  [facility];  Service: Gastroenterology;  Laterality: N/A;   HEMOSTASIS CLIP PLACEMENT  09/16/2021   Procedure: HEMOSTASIS CLIP PLACEMENT;  Surgeon: Gatha Mayer, MD;  Location: [place];  Service: Gastroenterology;;   HOT HEMOSTASIS N/A 09/16/2021   Procedure: HOT HEMOSTASIS (ARGON PLASMA COAGULATION/BICAP);  Surgeon: Gatha Mayer, MD;  Location: [place];  Service: Gastroenterology;  Laterality: N/A;   HOT HEMOSTASIS  10/11/2021   Procedure: HOT HEMOSTASIS (ARGON PLASMA COAGULATION/BICAP);  Surgeon: Montez Morita, Quillian Quince, MD;  [facility];  Service: Gastroenterology;;   INSERTION OF ILIAC STENT Right 08/20/2020   Procedure: RETROGRADE INSERTION OF RIGHT ILIAC STENT;  Surgeon: Marty Heck, MD;  Location: [place];  Service: Vascular;  Laterality: Right;   INTRAOPERATIVE ARTERIOGRAM Right 08/20/2020   Procedure: INTRA OPERATIVE ARTERIOGRAM ILIAC;  Surgeon: Marty Heck, MD;  Location: [place];  Service: Vascular;  Laterality: Right;   PATCH ANGIOPLASTY Right 08/20/2020   Procedure: PATCH ANGIOPLASTY RIGHT FEMORAL ARTERY;  Surgeon: Marty Heck, MD;  Location: [place];  Service: Vascular;  Laterality:  Right;   PERIPHERAL VASCULAR BALLOON ANGIOPLASTY Right 05/30/2021   Procedure: PERIPHERAL VASCULAR BALLOON ANGIOPLASTY;  Surgeon: Marty Heck, MD;  Location: [facility];  Service: Cardiovascular;  Laterality: Right;   PORTACATH PLACEMENT Left 06/13/2016   Procedure: INSERTION PORT-A-CATH;  Surgeon: Aviva Signs, MD;  [facility];  Service: General;  Laterality: Left;   TRANSURETHRAL RESECTION OF PROSTATE N/A 05/31/2020   Procedure: TRANSURETHRAL RESECTION OF THE PROSTATE (TURP);  Surgeon: Cleon Gustin, MD;  [facility];  Service: Urology;  Laterality: N/A;   VIDEO BRONCHOSCOPY WITH ENDOBRONCHIAL NAVIGATION N/A 05/28/2016   Procedure: VIDEO BRONCHOSCOPY WITH ENDOBRONCHIAL NAVIGATION;  Surgeon: [name], MD;  Location: [place];  Service: Thoracic;  Laterality: N/A;   VIDEO BRONCHOSCOPY WITH ENDOBRONCHIAL ULTRASOUND N/A 05/28/2016   Procedure: VIDEO BRONCHOSCOPY WITH ENDOBRONCHIAL ULTRASOUND;  Surgeon: [name], MD;  Location: [place];  Service: Thoracic;  Laterality: N/A;    Current Medications: Current Meds  Medication Sig   Blood Glucose Monitoring Suppl (ONETOUCH VERIO REFLECT) w/Device KIT Use to test blood sugars daily as directed. DX: E11.9   Cyanocobalamin (B-12 PO) Take 1 tablet by mouth daily.   famotidine (PEPCID) 20 MG tablet One after supper (Patient taking differently: Take 20 mg by mouth daily.)   fluticasone (FLONASE) 50  MCG/ACT nasal spray PLACE 1 SPRAY INTO BOTH NOSTRILS DAILY AS NEEDED FOR ALLERGIES OR RHINITIS.   Fluticasone-Umeclidin-Vilant (TRELEGY ELLIPTA) 100-62.5-25 MCG/INH AEPB Inhale 1 puff into the lungs daily.   gabapentin (NEURONTIN) 300 MG capsule TAKE 1 CAPSULE BY MOUTH THREE TIMES A DAY (Patient taking differently: Take 300 mg by mouth 3 (three) times daily.)   Iron, Ferrous Sulfate, 325 (65 Fe) MG TABS Take 325 mg by mouth 2 (two) times daily. (Patient taking differently: Take 325 mg by mouth every evening.)    KLOR-CON M20 20 MEQ tablet TAKE 1 TABLET BY MOUTH EVERY DAY (Patient taking differently: Take 20 mEq by mouth daily.)   levothyroxine (SYNTHROID) 50 MCG tablet TAKE 1 TABLET BY MOUTH EVERY DAY (Patient taking differently: Take 50 mcg by mouth daily before breakfast.)   linaclotide (LINZESS) 72 MCG capsule Take 1 capsule (72 mcg total) by mouth daily as needed (constipation).   Melatonin 10 MG TABS Take 10 mg by mouth at bedtime as needed (sleep).   metolazone (ZAROXOLYN) 2.5 MG tablet Take 1 tablet (2.5 mg total) by mouth 3 (three) times a week.   NON FORMULARY CPAP at bedtime   ondansetron (ZOFRAN) 4 MG tablet Take 1 tablet (4 mg total) by mouth every 8 (eight) hours as needed for nausea or vomiting.   OneTouch Delica Lancets 28J MISC Use to test blood sugars daily as directed. DX: E11.9   ONETOUCH VERIO test strip USE TO TEST BLOOD SUGARS DAILY AS DIRECTED. DX: E11.9   OXYGEN Inhale 2 L into the lungs continuous.   pantoprazole (PROTONIX) 40 MG tablet Take 1 tablet (40 mg total) by mouth 2 (two) times daily before a meal.   pravastatin (PRAVACHOL) 40 MG tablet TAKE 1 TABLET (40 MG TOTAL) BY MOUTH ON MONDAYS , WEDNESDAYS, AND FRIDAYS AS DIRECTED (Patient taking differently: Take 40 mg by mouth 3 (three) times a week. Mon, wed, and fri)   Semaglutide (RYBELSUS) 7 MG TABS Take 7 mg by mouth daily.   spironolactone (ALDACTONE) 25 MG tablet Take 1 tablet (25 mg total) by mouth daily.   torsemide (DEMADEX) 20 MG tablet Take 2 tablets (40 mg) in the morning, take 2 tablets (40 mg) in the evening daily as needed.   zolpidem (AMBIEN) 5 MG tablet Take 1 tablet (5 mg total) by mouth at bedtime as needed for sleep.   [DISCONTINUED] apixaban (ELIQUIS) 5 MG TABS tablet TAKE 1 TABLET BY MOUTH TWICE A DAY     Allergies:    Sulfa antibiotics, Atorvastatin, Jardiance [empagliflozin], Lopressor [metoprolol], Rosuvastatin, Doxycycline, and Temazepam   Social History: Social History   Socioeconomic History    Marital status: Soil scientist    Spouse name: Not on file   Number of children: 5   Years of education: 10   Highest education level: 10th grade  Occupational History   Occupation: Retired  Tobacco Use   Smoking status: Every Day    Packs/day: 0.50    Years: 55.00    Total pack years: 27.50    Types: Cigarettes    Start date: 03/11/1961   Smokeless tobacco: Never   Tobacco comments:    1/2 pack to 1 pack per day 12/14/19  Vaping Use   Vaping Use: Never used  Substance and Sexual Activity   Alcohol use: No   Drug use: No   Sexual activity: Yes    Birth control/protection: None  Other Topics Concern   Not on file  Social History Narrative   [name]  is his POA and lives with him. Children out of town - one in [facility], others in [place].   Social Determinants of Health   Financial Resource Strain: Low Risk  (09/18/2020)   Overall Financial Resource Strain (CARDIA)    Difficulty of Paying Living Expenses: Not hard at all  Food Insecurity: No Food Insecurity (09/18/2020)   Hunger Vital Sign    Worried About Running Out of Food in the Last Year: Never true    Ran Out of Food in the Last Year: Never true  Transportation Needs: No Transportation Needs (09/18/2020)   PRAPARE - Hydrologist (Medical): No    Lack of Transportation (Non-Medical): No  Physical Activity: Unknown (09/18/2020)   Exercise Vital Sign    Days of Exercise per Week: 7 days    Minutes of Exercise per Session: Not on file  Stress: No Stress Concern Present (09/18/2020)   [place]    Feeling of Stress : Not at all  Social Connections: Moderately Isolated (09/18/2020)   Social Connection and Isolation Panel [NHANES]    Frequency of Communication with Friends and Family: More than three times a week    Frequency of Social Gatherings with Friends and Family: Once a week    Attends Religious Services: Never     Marine scientist or Organizations: No    Attends Music therapist: Never    Marital Status: Living with partner     Family History: The patient'sfamily history includes Diabetes in his father; Heart disease in his father; Hypertension in his mother and sister; Stroke in his father.  ROS:   All other ROS reviewed and negative. Pertinent positives noted in the HPI.     EKGs/Labs/Other Studies Reviewed:   The following studies were personally reviewed by me today:  EKG:  EKG is ordered today.  The ekg ordered today demonstrates atrial flutter with 4 1 block, right bundle branch block, and was personally reviewed by me.   TTE 09/15/2021  1. Aortic valve calcified with restricted motion but no AS by doppler.   2. Left ventricular ejection fraction, by estimation, is 55 to 60%. The  left ventricle has normal function. The left ventricle has no regional  wall motion abnormalities. Left ventricular diastolic parameters are  indeterminate.   3. Right ventricular systolic function is normal. The right ventricular  size is normal. There is mildly elevated pulmonary artery systolic  pressure.   4. Left atrial size was mildly dilated.   5. The mitral valve is normal in structure. No evidence of mitral valve  regurgitation. No evidence of mitral stenosis. Moderate mitral annular  calcification.   6. The aortic valve is calcified. Aortic valve regurgitation is trivial.  Aortic valve sclerosis/calcification is present, without any evidence of  aortic stenosis.   7. The inferior vena cava is dilated in size with <50% respiratory  variability, suggesting right atrial pressure of 15 mmHg.   Recent Labs: 08/23/2021: TSH 2.510 09/16/2021: Magnesium 2.2 10/10/2021: B Natriuretic Peptide 191.0 10/14/2021: ALT 19; BUN 19; Creatinine, Ser 1.06; Hemoglobin 9.1; Platelets 326; Potassium 4.1; Sodium 140   Recent Lipid Panel    Component Value Date/Time   CHOL 126 08/21/2020 0530   CHOL  136 04/25/2019 1608   TRIG 89 08/21/2020 0530   HDL 28 (L) 08/21/2020 0530   HDL 28 (L) 04/25/2019 1608   CHOLHDL 4.5 08/21/2020 0530   VLDL  18 08/21/2020 0530   LDLCALC 80 08/21/2020 0530   LDLCALC 78 04/25/2019 1608    Physical Exam:   VS:  BP (!) 110/40 (BP Location: Left Arm)   Pulse 86   Ht 5' 6"  (1.676 m)   Wt 150 lb (68 kg)   SpO2 98%   BMI 24.21 kg/m    Wt Readings from Last 3 Encounters:  10/18/21 150 lb (68 kg)  10/14/21 158 lb (71.7 kg)  10/10/21 160 lb (72.6 kg)    General: Well nourished, well developed, in no acute distress Head: Atraumatic, normal size  Eyes: PEERLA, EOMI  Neck: Supple, no JVD Endocrine: No thryomegaly Cardiac: Normal S1, S2; RRR; no murmurs, rubs, or gallops Lungs: Clear to auscultation bilaterally, no wheezing, rhonchi or rales  Abd: Soft, nontender, no hepatomegaly  Ext: No edema, pulses 2+ Musculoskeletal: No deformities, BUE and BLE strength normal and equal Skin: Warm and dry, no rashes   Neuro: Alert and oriented to person, place, time, and situation, CNII-XII grossly intact, no focal deficits  Psych: Normal mood and affect   ASSESSMENT:   John [name] is a 76 y.o. male who presents for the following: 1. Chronic diastolic CHF (congestive heart failure) ([place])   2. Atrial flutter, unspecified type ([place])   3. Chronic anemia   4. Hyperlipidemia, unspecified hyperlipidemia type   5. Primary hypertension     PLAN:   1. Chronic diastolic CHF (congestive heart failure) (HCC) -Volume status much improved on torsemide 40 mg twice daily as well as metolazone 2.5 mg on Monday Wednesdays and Fridays.  Weight is down 12 pounds.  Recommended he continue with salt reduction.  Also recommended he elevate his legs and wear compression stockings.  He is volume status is improving.  I think his heart failure has been exacerbated by atrial flutter.  See discussion below.  Now with ongoing GI bleed which is an issue.  2. Atrial flutter,  unspecified type ([place]) 3. Chronic anemia -History of atrial flutter with T/cardioversion.  Now with recurrent anemia.  Has had multiple admissions for recurrent GI bleed.  Most recent admission last week with multiple AVMs in the stomach and duodenum.  He continues to have recurrent issues with this.  He is now back in atrial flutter with 4 1 block.  He is rate controlled on no medication.  However given his worsening volume status I am a bit concerned that this is the culprit.  Given that he has ongoing anemia and GI bleed his anticoagulation has not been restarted.  I reached out to his gastroenterologist Dr. Henrene Pastor to determine his candidacy for short-term anticoagulation.  We would like to at least pursue 4 weeks with likely a flutter ablation.  We will need to know what his candidacy for this is.  He also could be a candidate for a Watchman procedure if he can tolerate anticoagulation for at least 2 months.  Again we will have his gastroenterologist weigh in on this.  For now we will just continue with current medications.  No Eliquis given GI bleed.  4. Hyperlipidemia, unspecified hyperlipidemia type -Intolerant of statins.  5. Primary hypertension -Well-controlled.  Disposition: Return in about 2 months (around 12/19/2021).  Medication Adjustments/Labs and Tests Ordered: Current medicines are reviewed at length with the patient today.  Concerns regarding medicines are outlined above.  Orders Placed This Encounter  Procedures   EKG 12-Lead   No orders of the defined types were placed in this encounter.   Patient  Instructions  Medication Instructions:  STOP Eliquis  *If you need a refill on your cardiac medications before your next appointment, please call your pharmacy*   Follow-Up: At [facility], you and your health needs are our priority.  As part of our continuing mission to provide you with exceptional heart care, we have created designated Provider Care Teams.  These Care  Teams include your primary Cardiologist (physician) and Advanced Practice Providers (APPs -  Physician Assistants and Nurse Practitioners) who all work together to provide you with the care you need, when you need it.  We recommend signing up for the patient portal called "MyChart".  Sign up information is provided on this After Visit Summary.  MyChart is used to connect with patients for Virtual Visits (Telemedicine).  Patients are able to view lab/test results, encounter notes, upcoming appointments, etc.  Non-urgent messages can be sent to your provider as well.   To learn more about what you can do with MyChart, go to [email or web address].    Your next appointment:   2 month(s)  The format for your next appointment:   In Person  Provider:   Evalina Field, MD              Time Spent with Patient: I have spent a total of 35 minutes with patient reviewing hospital notes, telemetry, EKGs, labs and examining the patient as well as establishing an assessment and plan that was discussed with the patient.  > 50% of time was spent in direct patient care.  Signed, Addison Naegeli. Audie Box, MD, [place]  [address] [place], [place] [address] [phone]  10/18/2021 2:45 PM

## 2021-10-18 ENCOUNTER — Ambulatory Visit (INDEPENDENT_AMBULATORY_CARE_PROVIDER_SITE_OTHER): Payer: Medicare Other | Admitting: Cardiovascular Disease

## 2021-10-18 ENCOUNTER — Encounter: Payer: Self-pay | Admitting: Cardiovascular Disease

## 2021-10-18 VITALS — BP 110/40 | HR 86 | Ht 66.0 in | Wt 150.0 lb

## 2021-10-18 DIAGNOSIS — C3491 Malignant neoplasm of unspecified part of right bronchus or lung: Secondary | ICD-10-CM | POA: Diagnosis not present

## 2021-10-18 DIAGNOSIS — D649 Anemia, unspecified: Secondary | ICD-10-CM | POA: Diagnosis not present

## 2021-10-18 DIAGNOSIS — I10 Essential (primary) hypertension: Secondary | ICD-10-CM | POA: Diagnosis not present

## 2021-10-18 DIAGNOSIS — E1159 Type 2 diabetes mellitus with other circulatory complications: Secondary | ICD-10-CM | POA: Diagnosis not present

## 2021-10-18 DIAGNOSIS — I4892 Unspecified atrial flutter: Secondary | ICD-10-CM

## 2021-10-18 DIAGNOSIS — J9611 Chronic respiratory failure with hypoxia: Secondary | ICD-10-CM | POA: Diagnosis not present

## 2021-10-18 DIAGNOSIS — J449 Chronic obstructive pulmonary disease, unspecified: Secondary | ICD-10-CM | POA: Diagnosis not present

## 2021-10-18 DIAGNOSIS — I152 Hypertension secondary to endocrine disorders: Secondary | ICD-10-CM | POA: Diagnosis not present

## 2021-10-18 DIAGNOSIS — I5032 Chronic diastolic (congestive) heart failure: Secondary | ICD-10-CM

## 2021-10-18 DIAGNOSIS — I5033 Acute on chronic diastolic (congestive) heart failure: Secondary | ICD-10-CM | POA: Diagnosis not present

## 2021-10-18 DIAGNOSIS — E785 Hyperlipidemia, unspecified: Secondary | ICD-10-CM | POA: Diagnosis not present

## 2021-10-18 NOTE — Patient Instructions (Signed)
Medication Instructions:  STOP Eliquis  *If you need a refill on your cardiac medications before your next appointment, please call your pharmacy*   Follow-Up: At [facility], you and your health needs are our priority.  As part of our continuing mission to provide you with exceptional heart care, we have created designated Provider Care Teams.  These Care Teams include your primary Cardiologist (physician) and Advanced Practice Providers (APPs -  Physician Assistants and Nurse Practitioners) who all work together to provide you with the care you need, when you need it.  We recommend signing up for the patient portal called "MyChart".  Sign up information is provided on this After Visit Summary.  MyChart is used to connect with patients for Virtual Visits (Telemedicine).  Patients are able to view lab/test results, encounter notes, upcoming appointments, etc.  Non-urgent messages can be sent to your provider as well.   To learn more about what you can do with MyChart, go to [email or web address].    Your next appointment:   2 month(s)  The format for your next appointment:   In Person  Provider:   Evalina Field, MD

## 2021-10-21 DIAGNOSIS — H26492 Other secondary cataract, left eye: Secondary | ICD-10-CM | POA: Diagnosis not present

## 2021-10-21 DIAGNOSIS — C3491 Malignant neoplasm of unspecified part of right bronchus or lung: Secondary | ICD-10-CM | POA: Diagnosis not present

## 2021-10-21 DIAGNOSIS — E1159 Type 2 diabetes mellitus with other circulatory complications: Secondary | ICD-10-CM | POA: Diagnosis not present

## 2021-10-21 DIAGNOSIS — J9611 Chronic respiratory failure with hypoxia: Secondary | ICD-10-CM | POA: Diagnosis not present

## 2021-10-21 DIAGNOSIS — E119 Type 2 diabetes mellitus without complications: Secondary | ICD-10-CM | POA: Diagnosis not present

## 2021-10-21 DIAGNOSIS — I152 Hypertension secondary to endocrine disorders: Secondary | ICD-10-CM | POA: Diagnosis not present

## 2021-10-21 DIAGNOSIS — H353131 Nonexudative age-related macular degeneration, bilateral, early dry stage: Secondary | ICD-10-CM | POA: Diagnosis not present

## 2021-10-21 DIAGNOSIS — Z961 Presence of intraocular lens: Secondary | ICD-10-CM | POA: Diagnosis not present

## 2021-10-21 DIAGNOSIS — J449 Chronic obstructive pulmonary disease, unspecified: Secondary | ICD-10-CM | POA: Diagnosis not present

## 2021-10-21 DIAGNOSIS — I5033 Acute on chronic diastolic (congestive) heart failure: Secondary | ICD-10-CM | POA: Diagnosis not present

## 2021-10-21 DIAGNOSIS — H43812 Vitreous degeneration, left eye: Secondary | ICD-10-CM | POA: Diagnosis not present

## 2021-10-21 LAB — HM DIABETES EYE EXAM

## 2021-10-23 ENCOUNTER — Telehealth: Payer: Self-pay | Admitting: Cardiovascular Disease

## 2021-10-23 NOTE — Telephone Encounter (Signed)
Calling to say that patient is losing too much weight. He went from 164lbs to 146lbs in four days. Please advise

## 2021-10-23 NOTE — Telephone Encounter (Signed)
Spoke with Otila Kluver from home health who states that patient has went from 164lb to 146lb in 4 days. Cleora Fleet that patient weighed 150lbs at recent appointment on 7/14 with Dr. Harrie Jeans states that patient is concerned about continued weight loss, confirmed with Otila Kluver that patient is taking all prescribed medications including his diuretics. Per Otila Kluver patient is not short of breath or experiencing any symptoms. Per Otila Kluver patient has slight lower extremity swelling but it is better than prior. Cleora Fleet that I Will forward to MD for review.

## 2021-10-24 ENCOUNTER — Ambulatory Visit: Payer: Medicare Other | Admitting: Family

## 2021-10-24 DIAGNOSIS — J449 Chronic obstructive pulmonary disease, unspecified: Secondary | ICD-10-CM | POA: Diagnosis not present

## 2021-10-24 DIAGNOSIS — E1159 Type 2 diabetes mellitus with other circulatory complications: Secondary | ICD-10-CM | POA: Diagnosis not present

## 2021-10-24 DIAGNOSIS — I5033 Acute on chronic diastolic (congestive) heart failure: Secondary | ICD-10-CM | POA: Diagnosis not present

## 2021-10-24 DIAGNOSIS — I152 Hypertension secondary to endocrine disorders: Secondary | ICD-10-CM | POA: Diagnosis not present

## 2021-10-24 DIAGNOSIS — C3491 Malignant neoplasm of unspecified part of right bronchus or lung: Secondary | ICD-10-CM | POA: Diagnosis not present

## 2021-10-24 DIAGNOSIS — J9611 Chronic respiratory failure with hypoxia: Secondary | ICD-10-CM | POA: Diagnosis not present

## 2021-10-24 NOTE — Telephone Encounter (Signed)
Del Aire-  home health nurse, gave advice from MD.  She verbalized understanding.

## 2021-10-25 ENCOUNTER — Ambulatory Visit: Payer: Medicare Other | Admitting: Family

## 2021-10-25 DIAGNOSIS — I152 Hypertension secondary to endocrine disorders: Secondary | ICD-10-CM | POA: Diagnosis not present

## 2021-10-25 DIAGNOSIS — I5033 Acute on chronic diastolic (congestive) heart failure: Secondary | ICD-10-CM | POA: Diagnosis not present

## 2021-10-25 DIAGNOSIS — J9611 Chronic respiratory failure with hypoxia: Secondary | ICD-10-CM | POA: Diagnosis not present

## 2021-10-25 DIAGNOSIS — E1159 Type 2 diabetes mellitus with other circulatory complications: Secondary | ICD-10-CM | POA: Diagnosis not present

## 2021-10-25 DIAGNOSIS — J449 Chronic obstructive pulmonary disease, unspecified: Secondary | ICD-10-CM | POA: Diagnosis not present

## 2021-10-25 DIAGNOSIS — C3491 Malignant neoplasm of unspecified part of right bronchus or lung: Secondary | ICD-10-CM | POA: Diagnosis not present

## 2021-10-27 DIAGNOSIS — D62 Acute posthemorrhagic anemia: Secondary | ICD-10-CM | POA: Diagnosis not present

## 2021-10-27 DIAGNOSIS — R338 Other retention of urine: Secondary | ICD-10-CM | POA: Diagnosis not present

## 2021-10-27 DIAGNOSIS — E1165 Type 2 diabetes mellitus with hyperglycemia: Secondary | ICD-10-CM | POA: Diagnosis not present

## 2021-10-27 DIAGNOSIS — K922 Gastrointestinal hemorrhage, unspecified: Secondary | ICD-10-CM | POA: Diagnosis not present

## 2021-10-27 DIAGNOSIS — E782 Mixed hyperlipidemia: Secondary | ICD-10-CM | POA: Diagnosis not present

## 2021-10-27 DIAGNOSIS — I152 Hypertension secondary to endocrine disorders: Secondary | ICD-10-CM | POA: Diagnosis not present

## 2021-10-27 DIAGNOSIS — T451X5D Adverse effect of antineoplastic and immunosuppressive drugs, subsequent encounter: Secondary | ICD-10-CM | POA: Diagnosis not present

## 2021-10-27 DIAGNOSIS — J9611 Chronic respiratory failure with hypoxia: Secondary | ICD-10-CM | POA: Diagnosis not present

## 2021-10-27 DIAGNOSIS — E1151 Type 2 diabetes mellitus with diabetic peripheral angiopathy without gangrene: Secondary | ICD-10-CM | POA: Diagnosis not present

## 2021-10-27 DIAGNOSIS — F32A Depression, unspecified: Secondary | ICD-10-CM | POA: Diagnosis not present

## 2021-10-27 DIAGNOSIS — E032 Hypothyroidism due to medicaments and other exogenous substances: Secondary | ICD-10-CM | POA: Diagnosis not present

## 2021-10-27 DIAGNOSIS — J449 Chronic obstructive pulmonary disease, unspecified: Secondary | ICD-10-CM | POA: Diagnosis not present

## 2021-10-27 DIAGNOSIS — I4892 Unspecified atrial flutter: Secondary | ICD-10-CM | POA: Diagnosis not present

## 2021-10-27 DIAGNOSIS — I480 Paroxysmal atrial fibrillation: Secondary | ICD-10-CM | POA: Diagnosis not present

## 2021-10-27 DIAGNOSIS — E1140 Type 2 diabetes mellitus with diabetic neuropathy, unspecified: Secondary | ICD-10-CM | POA: Diagnosis not present

## 2021-10-27 DIAGNOSIS — M1990 Unspecified osteoarthritis, unspecified site: Secondary | ICD-10-CM | POA: Diagnosis not present

## 2021-10-27 DIAGNOSIS — F419 Anxiety disorder, unspecified: Secondary | ICD-10-CM | POA: Diagnosis not present

## 2021-10-27 DIAGNOSIS — E1169 Type 2 diabetes mellitus with other specified complication: Secondary | ICD-10-CM | POA: Diagnosis not present

## 2021-10-27 DIAGNOSIS — C3491 Malignant neoplasm of unspecified part of right bronchus or lung: Secondary | ICD-10-CM | POA: Diagnosis not present

## 2021-10-27 DIAGNOSIS — G4733 Obstructive sleep apnea (adult) (pediatric): Secondary | ICD-10-CM | POA: Diagnosis not present

## 2021-10-27 DIAGNOSIS — H3530 Unspecified macular degeneration: Secondary | ICD-10-CM | POA: Diagnosis not present

## 2021-10-27 DIAGNOSIS — D630 Anemia in neoplastic disease: Secondary | ICD-10-CM | POA: Diagnosis not present

## 2021-10-27 DIAGNOSIS — N401 Enlarged prostate with lower urinary tract symptoms: Secondary | ICD-10-CM | POA: Diagnosis not present

## 2021-10-27 DIAGNOSIS — I5033 Acute on chronic diastolic (congestive) heart failure: Secondary | ICD-10-CM | POA: Diagnosis not present

## 2021-10-27 DIAGNOSIS — E1159 Type 2 diabetes mellitus with other circulatory complications: Secondary | ICD-10-CM | POA: Diagnosis not present

## 2021-10-28 ENCOUNTER — Encounter: Payer: Self-pay | Admitting: Urology

## 2021-10-28 ENCOUNTER — Ambulatory Visit (INDEPENDENT_AMBULATORY_CARE_PROVIDER_SITE_OTHER): Payer: Medicare Other | Admitting: Urology

## 2021-10-28 ENCOUNTER — Telehealth: Payer: Self-pay | Admitting: Family

## 2021-10-28 VITALS — BP 96/55 | HR 85

## 2021-10-28 DIAGNOSIS — R339 Retention of urine, unspecified: Secondary | ICD-10-CM

## 2021-10-28 DIAGNOSIS — N138 Other obstructive and reflux uropathy: Secondary | ICD-10-CM

## 2021-10-28 DIAGNOSIS — N401 Enlarged prostate with lower urinary tract symptoms: Secondary | ICD-10-CM

## 2021-10-28 LAB — URINALYSIS, ROUTINE W REFLEX MICROSCOPIC
Bilirubin, UA: NEGATIVE
Glucose, UA: NEGATIVE
Ketones, UA: NEGATIVE
Leukocytes,UA: NEGATIVE
Nitrite, UA: NEGATIVE
Protein,UA: NEGATIVE
RBC, UA: NEGATIVE
Specific Gravity, UA: 1.01 (ref 1.005–1.030)
Urobilinogen, Ur: 0.2 mg/dL (ref 0.2–1.0)
pH, UA: 6 (ref 5.0–7.5)

## 2021-10-28 LAB — BLADDER SCAN AMB NON-IMAGING: Scan Result: 0

## 2021-10-28 MED ORDER — DUTASTERIDE 0.5 MG PO CAPS: 0.5000 mg | ORAL_CAPSULE | Freq: Every day | ORAL | 3 refills | Status: DC

## 2021-10-28 NOTE — Progress Notes (Signed)
post void residual=0 ?

## 2021-10-28 NOTE — Progress Notes (Signed)
10/28/2021 9:11 AM   John [name] 1945/09/23 [id number]  Referring provider: [name], [address] [place],  [place] [address]  Followup BPH and urinary retention   HPI: John [name] is a 76yo here for followup for BPH and urinary retention. IPSS 4 QOl 1. He is off dutasteride. Urine stream strong but he has splaying of urinary stream. Nocturia 0x. PVR 0cc.    PMH: Past Medical History:  Diagnosis Date   Adenocarcinoma of lung, right ([facility]) 04/18/2016   Anemia    Anxiety    Arthritis    Asthma    Atrial flutter (HCC)    BPH (benign prostatic hyperplasia)    with urinary retention 02/06/20   CHF (congestive heart failure) (HCC)    COPD (chronic obstructive pulmonary disease) ([place])    Depression    Diabetes mellitus, type II ([place])    Dyspnea    History of kidney stones    History of radiation therapy    right lung 09/10/2020-09/17/2020   Dr Sondra Come   Hyperlipidemia    Hypertension    Hypothyroidism    Macular degeneration    Neuropathy    Non-small cell lung cancer, right ([facility]) 04/18/2016   Peripheral vascular disease ([place])    Prostatitis    Pulmonary nodule, left 07/16/2016   Sleep apnea    cpap    Surgical History: Past Surgical History:  Procedure Laterality Date   ABDOMINAL AORTOGRAM W/LOWER EXTREMITY Left 02/06/2020   Procedure: ABDOMINAL AORTOGRAM W/LOWER EXTREMITY;  Surgeon: Lorretta Harp, MD;  Location: [facility];  Service: Cardiovascular;  Laterality: Left;   ABDOMINAL AORTOGRAM W/LOWER EXTREMITY N/A 05/30/2021   Procedure: ABDOMINAL AORTOGRAM W/LOWER EXTREMITY;  Surgeon: Marty Heck, MD;  Location: [facility];  Service: Cardiovascular;  Laterality: N/A;   BIOPSY  09/16/2021   Procedure: BIOPSY;  Surgeon: Gatha Mayer, MD;  Location: [facility];  Service: Gastroenterology;;   CARDIOVERSION N/A 10/05/2020   Procedure: CARDIOVERSION;  Surgeon: Geralynn Rile, MD;  Location: [facility];  Service:  Cardiovascular;  Laterality: N/A;   CATARACT EXTRACTION, BILATERAL Bilateral    COLONOSCOPY WITH PROPOFOL N/A 06/20/2021   Procedure: COLONOSCOPY WITH PROPOFOL;  Surgeon: Irene Shipper, MD;  [facility];  Service: Endoscopy;  Laterality: N/A;   COLONOSCOPY WITH PROPOFOL N/A 09/16/2021   Procedure: COLONOSCOPY WITH PROPOFOL;  Surgeon: Gatha Mayer, MD;  Location: [facility];  Service: Gastroenterology;  Laterality: N/A;   ENDARTERECTOMY FEMORAL Right 08/20/2020   Procedure: ENDARTERECTOMY  RIGHT FEMORAL ARTERY;  Surgeon: Marty Heck, MD;  Location: [facility];  Service: Vascular;  Laterality: Right;   ENTEROSCOPY N/A 10/11/2021   Procedure: ENTEROSCOPY;  Surgeon: Harvel Quale, MD;  [facility];  Service: Gastroenterology;  Laterality: N/A;   ESOPHAGOGASTRODUODENOSCOPY N/A 09/16/2021   Procedure: ESOPHAGOGASTRODUODENOSCOPY (EGD);  Surgeon: Gatha Mayer, MD;  Location: [facility];  Service: Gastroenterology;  Laterality: N/A;   ESOPHAGOGASTRODUODENOSCOPY (EGD) WITH PROPOFOL N/A 09/06/2020   Procedure: ESOPHAGOGASTRODUODENOSCOPY (EGD) WITH PROPOFOL;  Surgeon: Irene Shipper, MD;  Location: [facility];  Service: Endoscopy;  Laterality: N/A;   ESOPHAGOGASTRODUODENOSCOPY (EGD) WITH PROPOFOL N/A 10/11/2021   Procedure: ESOPHAGOGASTRODUODENOSCOPY (EGD) WITH PROPOFOL;  Surgeon: Harvel Quale, MD;  [facility];  Service: Gastroenterology;  Laterality: N/A;   HEMOSTASIS CLIP PLACEMENT  09/16/2021   Procedure: HEMOSTASIS CLIP PLACEMENT;  Surgeon: Gatha Mayer, MD;  Location: [facility];  Service: Gastroenterology;;   HOT HEMOSTASIS  N/A 09/16/2021   Procedure: HOT HEMOSTASIS (ARGON PLASMA COAGULATION/BICAP);  Surgeon: Gatha Mayer, MD;  Location: [facility];  Service: Gastroenterology;  Laterality: N/A;   HOT HEMOSTASIS  10/11/2021   Procedure: HOT HEMOSTASIS (ARGON PLASMA COAGULATION/BICAP);  Surgeon: Montez Morita, Quillian Quince, MD;   [facility];  Service: Gastroenterology;;   INSERTION OF ILIAC STENT Right 08/20/2020   Procedure: RETROGRADE INSERTION OF RIGHT ILIAC STENT;  Surgeon: Marty Heck, MD;  Location: [facility];  Service: Vascular;  Laterality: Right;   INTRAOPERATIVE ARTERIOGRAM Right 08/20/2020   Procedure: INTRA OPERATIVE ARTERIOGRAM ILIAC;  Surgeon: Marty Heck, MD;  Location: [facility];  Service: Vascular;  Laterality: Right;   PATCH ANGIOPLASTY Right 08/20/2020   Procedure: PATCH ANGIOPLASTY RIGHT FEMORAL ARTERY;  Surgeon: Marty Heck, MD;  Location: [facility];  Service: Vascular;  Laterality: Right;   PERIPHERAL VASCULAR BALLOON ANGIOPLASTY Right 05/30/2021   Procedure: PERIPHERAL VASCULAR BALLOON ANGIOPLASTY;  Surgeon: Marty Heck, MD;  Location: [facility];  Service: Cardiovascular;  Laterality: Right;   PORTACATH PLACEMENT Left 06/13/2016   Procedure: INSERTION PORT-A-CATH;  Surgeon: Aviva Signs, MD;  [facility];  Service: General;  Laterality: Left;   TRANSURETHRAL RESECTION OF PROSTATE N/A 05/31/2020   Procedure: TRANSURETHRAL RESECTION OF THE PROSTATE (TURP);  Surgeon: Cleon Gustin, MD;  [facility];  Service: Urology;  Laterality: N/A;   VIDEO BRONCHOSCOPY WITH ENDOBRONCHIAL NAVIGATION N/A 05/28/2016   Procedure: VIDEO BRONCHOSCOPY WITH ENDOBRONCHIAL NAVIGATION;  Surgeon: [name], MD;  Location: [facility];  Service: Thoracic;  Laterality: N/A;   VIDEO BRONCHOSCOPY WITH ENDOBRONCHIAL ULTRASOUND N/A 05/28/2016   Procedure: VIDEO BRONCHOSCOPY WITH ENDOBRONCHIAL ULTRASOUND;  Surgeon: [name], MD;  Location: [facility];  Service: Thoracic;  Laterality: N/A;    Home Medications:  Allergies as of 10/28/2021       Reactions   Sulfa Antibiotics Swelling   Mouth swelling   Atorvastatin Other (See Comments)   Muscle aches - tolerating Pravastatin 40 mg MWF   Jardiance [empagliflozin] Other (See Comments)   FEELS SLUGGISH, TIRED    Lopressor [metoprolol] Other (See Comments)   Fatigue   Rosuvastatin Other (See Comments)   Muscle aches - tolerating Pravastatin 40 mg MWF   Doxycycline Nausea Only   Temazepam Other (See Comments)   Made insomnia worse         Medication List        Accurate as of October 28, 2021  9:11 AM. If you have any questions, ask your nurse or doctor.          B-12 PO Take 1 tablet by mouth daily.   famotidine 20 MG tablet Commonly known as: Pepcid One after supper What changed:  how much to take how to take this when to take this additional instructions   fluticasone 50 MCG/ACT nasal spray Commonly known as: FLONASE PLACE 1 SPRAY INTO BOTH NOSTRILS DAILY AS NEEDED FOR ALLERGIES OR RHINITIS.   gabapentin 300 MG capsule Commonly known as: NEURONTIN TAKE 1 CAPSULE BY MOUTH THREE TIMES A DAY What changed: See the new instructions.   Iron (Ferrous Sulfate) 325 (65 Fe) MG Tabs Take 325 mg by mouth 2 (two) times daily. What changed: when to take this   Klor-Con M20 20 MEQ tablet Generic drug: potassium chloride SA TAKE 1 TABLET BY MOUTH EVERY DAY What changed: how much to take   levothyroxine 50 MCG tablet Commonly known as: SYNTHROID TAKE 1 TABLET BY MOUTH EVERY  DAY What changed: when to take this   linaclotide 72 MCG capsule Commonly known as: Linzess Take 1 capsule (72 mcg total) by mouth daily as needed (constipation).   Melatonin 10 MG Tabs Take 10 mg by mouth at bedtime as needed (sleep).   metolazone 2.5 MG tablet Commonly known as: ZAROXOLYN Take 1 tablet (2.5 mg total) by mouth 3 (three) times a week.   NON FORMULARY CPAP at bedtime   ondansetron 4 MG tablet Commonly known as: Zofran Take 1 tablet (4 mg total) by mouth every 8 (eight) hours as needed for nausea or vomiting.   OneTouch Delica Lancets 10X Misc Use to test blood sugars daily as directed. DX: E11.9   OneTouch Verio Reflect w/Device Kit Use to test blood sugars daily as directed. DX:  E11.9   OneTouch Verio test strip Generic drug: glucose blood USE TO TEST BLOOD SUGARS DAILY AS DIRECTED. DX: E11.9   OXYGEN Inhale 2 L into the lungs continuous.   pantoprazole 40 MG tablet Commonly known as: Protonix Take 1 tablet (40 mg total) by mouth 2 (two) times daily before a meal.   pravastatin 40 MG tablet Commonly known as: PRAVACHOL TAKE 1 TABLET (40 MG TOTAL) BY MOUTH ON MONDAYS , WEDNESDAYS, AND FRIDAYS AS DIRECTED What changed: See the new instructions.   Rybelsus 7 MG Tabs Generic drug: Semaglutide Take 7 mg by mouth daily.   spironolactone 25 MG tablet Commonly known as: ALDACTONE Take 1 tablet (25 mg total) by mouth daily.   torsemide 20 MG tablet Commonly known as: DEMADEX Take 2 tablets (40 mg) in the morning, take 2 tablets (40 mg) in the evening daily as needed.   Trelegy Ellipta 100-62.5-25 MCG/ACT Aepb Generic drug: Fluticasone-Umeclidin-Vilant Inhale 1 puff into the lungs daily.   zolpidem 5 MG tablet Commonly known as: AMBIEN Take 1 tablet (5 mg total) by mouth at bedtime as needed for sleep.        Allergies:  Allergies  Allergen Reactions   Sulfa Antibiotics Swelling    Mouth swelling   Atorvastatin Other (See Comments)    Muscle aches - tolerating Pravastatin 40 mg MWF   Jardiance [Empagliflozin] Other (See Comments)    FEELS SLUGGISH, TIRED   Lopressor [Metoprolol] Other (See Comments)    Fatigue   Rosuvastatin Other (See Comments)    Muscle aches - tolerating Pravastatin 40 mg MWF   Doxycycline Nausea Only   Temazepam Other (See Comments)    Made insomnia worse     Family History: Family History  Problem Relation Age of Onset   Hypertension Mother    Diabetes Father    Heart disease Father    Stroke Father    Hypertension Sister     Social History:  reports that he has been smoking cigarettes. He started smoking about 60 years ago. He has a 27.50 pack-year smoking history. He has never used smokeless tobacco. He  reports that he does not drink alcohol and does not use drugs.  ROS: All other review of systems were reviewed and are negative except what is noted above in HPI  Physical Exam: BP (!) 96/55   Pulse 85   Constitutional:  Alert and oriented, No acute distress. HEENT: [place] AT, moist mucus membranes.  Trachea midline, no masses. Cardiovascular: No clubbing, cyanosis, or edema. Respiratory: Normal respiratory effort, no increased work of breathing. GI: Abdomen is soft, nontender, nondistended, no abdominal masses GU: No CVA tenderness.  Lymph: No cervical or inguinal lymphadenopathy. Skin:  No rashes, bruises or suspicious lesions. Neurologic: Grossly intact, no focal deficits, moving all 4 extremities. Psychiatric: Normal mood and affect.  Laboratory Data: Lab Results  Component Value Date   WBC 6.7 10/14/2021   HGB 9.1 (L) 10/14/2021   HCT 27.6 (L) 10/14/2021   MCV 110 (H) 10/14/2021   PLT 326 10/14/2021    Lab Results  Component Value Date   CREATININE 1.06 10/14/2021    No results found for: "PSA"  No results found for: "TESTOSTERONE"  Lab Results  Component Value Date   HGBA1C 5.4 08/25/2021    Urinalysis    Component Value Date/Time   COLORURINE STRAW (A) 08/14/2020 1122   APPEARANCEUR Clear 10/07/2021 1152   LABSPEC 1.008 08/14/2020 1122   PHURINE 7.0 08/14/2020 1122   GLUCOSEU Negative 10/07/2021 1152   HGBUR NEGATIVE 08/14/2020 1122   BILIRUBINUR Negative 10/07/2021 1152   KETONESUR NEGATIVE 08/14/2020 1122   PROTEINUR Negative 10/07/2021 [place] 08/14/2020 1122   NITRITE Negative 10/07/2021 1152   NITRITE NEGATIVE 08/14/2020 1122   LEUKOCYTESUR Negative 10/07/2021 [place] 08/14/2020 1122    Lab Results  Component Value Date   LABMICR See below: 10/07/2021   WBCUA None seen 10/07/2021   LABEPIT None seen 10/07/2021   MUCUS Present 07/27/2020   BACTERIA None seen 10/07/2021    Pertinent Imaging:  Results  for orders placed in visit on 12/30/19  DG Abd 1 View  Narrative CLINICAL DATA:  Abdominal pain  EXAM: ABDOMEN - 1 VIEW  COMPARISON:  06/05/2017  FINDINGS: Scattered large and small bowel gas is noted. Mild retained fecal material is seen without obstructive change. Degenerative change of the lumbar spine is noted. No abnormal calcifications are seen.  IMPRESSION: Mild retained fecal material.   Electronically Signed By: Inez Catalina M.D. On: 01/01/2020 15:15  No results found for this or any previous visit.  No results found for this or any previous visit.  No results found for this or any previous visit.  No results found for this or any previous visit.  No results found for this or any previous visit.  No results found for this or any previous visit.  No results found for this or any previous visit.   Assessment & Plan:    1. BPH with urinary obstruction -restart dutasteride - BLADDER SCAN AMB NON-IMAGING  2. Urinary retention -resolved - Urinalysis, Routine w reflex microscopic   No follow-ups on file.  Nicolette Bang, MD  [facility] [place]

## 2021-10-28 NOTE — Telephone Encounter (Signed)
Angie called from [facility] stating that they need additional notes of what to order patient.   Says the order only says POC and they need it to be more specific.  Also says they need most recent F2F OV notes.  Can fax info to [phone]

## 2021-10-28 NOTE — Patient Instructions (Signed)

## 2021-10-29 ENCOUNTER — Encounter (HOSPITAL_COMMUNITY): Payer: Medicare Other

## 2021-10-29 ENCOUNTER — Ambulatory Visit: Payer: Medicare Other | Admitting: Vascular Surgery

## 2021-10-29 ENCOUNTER — Inpatient Hospital Stay (HOSPITAL_COMMUNITY): Admit: 2021-10-29 | Payer: Medicare Other

## 2021-10-29 DIAGNOSIS — I5033 Acute on chronic diastolic (congestive) heart failure: Secondary | ICD-10-CM | POA: Diagnosis not present

## 2021-10-29 DIAGNOSIS — K922 Gastrointestinal hemorrhage, unspecified: Secondary | ICD-10-CM | POA: Diagnosis not present

## 2021-10-29 DIAGNOSIS — D62 Acute posthemorrhagic anemia: Secondary | ICD-10-CM | POA: Diagnosis not present

## 2021-10-29 DIAGNOSIS — I152 Hypertension secondary to endocrine disorders: Secondary | ICD-10-CM | POA: Diagnosis not present

## 2021-10-29 DIAGNOSIS — J449 Chronic obstructive pulmonary disease, unspecified: Secondary | ICD-10-CM | POA: Diagnosis not present

## 2021-10-29 DIAGNOSIS — E1159 Type 2 diabetes mellitus with other circulatory complications: Secondary | ICD-10-CM | POA: Diagnosis not present

## 2021-10-29 NOTE — Telephone Encounter (Signed)
OV notes from 09/19/21 & O2 order from 10/03/21 sent via routing to [facility] fax number [phone]

## 2021-10-31 ENCOUNTER — Other Ambulatory Visit: Payer: Self-pay | Admitting: Cardiovascular Disease

## 2021-10-31 DIAGNOSIS — I152 Hypertension secondary to endocrine disorders: Secondary | ICD-10-CM | POA: Diagnosis not present

## 2021-10-31 DIAGNOSIS — E1159 Type 2 diabetes mellitus with other circulatory complications: Secondary | ICD-10-CM | POA: Diagnosis not present

## 2021-10-31 DIAGNOSIS — I5033 Acute on chronic diastolic (congestive) heart failure: Secondary | ICD-10-CM | POA: Diagnosis not present

## 2021-10-31 DIAGNOSIS — D62 Acute posthemorrhagic anemia: Secondary | ICD-10-CM | POA: Diagnosis not present

## 2021-10-31 DIAGNOSIS — J449 Chronic obstructive pulmonary disease, unspecified: Secondary | ICD-10-CM | POA: Diagnosis not present

## 2021-10-31 DIAGNOSIS — K922 Gastrointestinal hemorrhage, unspecified: Secondary | ICD-10-CM | POA: Diagnosis not present

## 2021-10-31 NOTE — Telephone Encounter (Signed)
Fax from [facility]: updated order for POC eval - [phone] - your order details are as follows: Portable O2 concentrator (Order for POC evaluation needed) In order to complete the referral, we will need to obtain the following information. This documentation is required by the pt's ins coverage: Please send updated order: order must state "POC EVAL" w/ titrate settings  Note written on fax "Sorry sent in error per TC w/ pt back on 09/23/21 he is going w/ another company" [employer] rep was here yesterday to discuss his order.

## 2021-11-01 DIAGNOSIS — K922 Gastrointestinal hemorrhage, unspecified: Secondary | ICD-10-CM | POA: Diagnosis not present

## 2021-11-01 DIAGNOSIS — D62 Acute posthemorrhagic anemia: Secondary | ICD-10-CM | POA: Diagnosis not present

## 2021-11-01 DIAGNOSIS — I152 Hypertension secondary to endocrine disorders: Secondary | ICD-10-CM | POA: Diagnosis not present

## 2021-11-01 DIAGNOSIS — J449 Chronic obstructive pulmonary disease, unspecified: Secondary | ICD-10-CM | POA: Diagnosis not present

## 2021-11-01 DIAGNOSIS — E1159 Type 2 diabetes mellitus with other circulatory complications: Secondary | ICD-10-CM | POA: Diagnosis not present

## 2021-11-01 DIAGNOSIS — I5033 Acute on chronic diastolic (congestive) heart failure: Secondary | ICD-10-CM | POA: Diagnosis not present

## 2021-11-05 DIAGNOSIS — E1142 Type 2 diabetes mellitus with diabetic polyneuropathy: Secondary | ICD-10-CM | POA: Diagnosis not present

## 2021-11-05 DIAGNOSIS — I152 Hypertension secondary to endocrine disorders: Secondary | ICD-10-CM | POA: Diagnosis not present

## 2021-11-05 DIAGNOSIS — D62 Acute posthemorrhagic anemia: Secondary | ICD-10-CM | POA: Diagnosis not present

## 2021-11-05 DIAGNOSIS — B351 Tinea unguium: Secondary | ICD-10-CM | POA: Diagnosis not present

## 2021-11-05 DIAGNOSIS — L84 Corns and callosities: Secondary | ICD-10-CM | POA: Diagnosis not present

## 2021-11-05 DIAGNOSIS — M79676 Pain in unspecified toe(s): Secondary | ICD-10-CM | POA: Diagnosis not present

## 2021-11-05 DIAGNOSIS — I5033 Acute on chronic diastolic (congestive) heart failure: Secondary | ICD-10-CM | POA: Diagnosis not present

## 2021-11-05 DIAGNOSIS — E1159 Type 2 diabetes mellitus with other circulatory complications: Secondary | ICD-10-CM | POA: Diagnosis not present

## 2021-11-05 DIAGNOSIS — J449 Chronic obstructive pulmonary disease, unspecified: Secondary | ICD-10-CM | POA: Diagnosis not present

## 2021-11-05 DIAGNOSIS — K922 Gastrointestinal hemorrhage, unspecified: Secondary | ICD-10-CM | POA: Diagnosis not present

## 2021-11-07 ENCOUNTER — Ambulatory Visit: Payer: Medicare Other | Admitting: Cardiovascular Disease

## 2021-11-07 DIAGNOSIS — I5033 Acute on chronic diastolic (congestive) heart failure: Secondary | ICD-10-CM | POA: Diagnosis not present

## 2021-11-07 DIAGNOSIS — J449 Chronic obstructive pulmonary disease, unspecified: Secondary | ICD-10-CM | POA: Diagnosis not present

## 2021-11-07 DIAGNOSIS — E1159 Type 2 diabetes mellitus with other circulatory complications: Secondary | ICD-10-CM | POA: Diagnosis not present

## 2021-11-07 DIAGNOSIS — I152 Hypertension secondary to endocrine disorders: Secondary | ICD-10-CM | POA: Diagnosis not present

## 2021-11-07 DIAGNOSIS — D62 Acute posthemorrhagic anemia: Secondary | ICD-10-CM | POA: Diagnosis not present

## 2021-11-07 DIAGNOSIS — K922 Gastrointestinal hemorrhage, unspecified: Secondary | ICD-10-CM | POA: Diagnosis not present

## 2021-11-09 ENCOUNTER — Other Ambulatory Visit: Payer: Self-pay | Admitting: Cardiovascular Disease

## 2021-11-12 DIAGNOSIS — K922 Gastrointestinal hemorrhage, unspecified: Secondary | ICD-10-CM | POA: Diagnosis not present

## 2021-11-12 DIAGNOSIS — D62 Acute posthemorrhagic anemia: Secondary | ICD-10-CM | POA: Diagnosis not present

## 2021-11-12 DIAGNOSIS — E1159 Type 2 diabetes mellitus with other circulatory complications: Secondary | ICD-10-CM | POA: Diagnosis not present

## 2021-11-12 DIAGNOSIS — I152 Hypertension secondary to endocrine disorders: Secondary | ICD-10-CM | POA: Diagnosis not present

## 2021-11-12 DIAGNOSIS — J449 Chronic obstructive pulmonary disease, unspecified: Secondary | ICD-10-CM | POA: Diagnosis not present

## 2021-11-12 DIAGNOSIS — I5033 Acute on chronic diastolic (congestive) heart failure: Secondary | ICD-10-CM | POA: Diagnosis not present

## 2021-11-15 ENCOUNTER — Ambulatory Visit (INDEPENDENT_AMBULATORY_CARE_PROVIDER_SITE_OTHER): Payer: Medicare Other

## 2021-11-15 ENCOUNTER — Telehealth: Payer: Self-pay

## 2021-11-15 VITALS — Wt 140.0 lb

## 2021-11-15 DIAGNOSIS — Z0000 Encounter for general adult medical examination without abnormal findings: Secondary | ICD-10-CM

## 2021-11-15 DIAGNOSIS — E1165 Type 2 diabetes mellitus with hyperglycemia: Secondary | ICD-10-CM

## 2021-11-15 DIAGNOSIS — D509 Iron deficiency anemia, unspecified: Secondary | ICD-10-CM

## 2021-11-15 NOTE — Telephone Encounter (Signed)
His labs from 7/10 showed anemia - he wants to know if he can come Monday 8/14 to recheck labs - then he has f/u appt with [facility] 8/21

## 2021-11-15 NOTE — Telephone Encounter (Signed)
Labs ordered.

## 2021-11-15 NOTE — Progress Notes (Signed)
Subjective:   John [name] is a 76 y.o. male who presents for Medicare Annual/Subsequent preventive examination.  Virtual Visit via Telephone Note  I connected with  John [name] on 11/15/21 at  8:15 AM EDT by telephone and verified that I am speaking with the correct person using two identifiers.  Location: Patient: Home Provider: [facility] Persons participating in the virtual visit: patient/Nurse Health Advisor   I discussed the limitations, risks, security and privacy concerns of performing an evaluation and management service by telephone and the availability of in person appointments. The patient expressed understanding and agreed to proceed.  Interactive audio and video telecommunications were attempted between this nurse and patient, however failed, due to patient having technical difficulties OR patient did not have access to video capability.  We continued and completed visit with audio only.  Some vital signs may be absent or patient reported.   Calogero Geisen E Milan Clare, LPN   Review of Systems     Cardiac Risk Factors include: advanced age (>23mn, >>62women);sedentary lifestyle;smoking/ tobacco exposure;male gender;hypertension;diabetes mellitus;family history of premature cardiovascular disease;Other (see comment), Risk factor comments: OSA on CPAP, PAD, CHF, chronic respiratory failure, anemia     Objective:    Today's Vitals   11/15/21 0816  Weight: 140 lb (63.5 kg)   Body mass index is 22.6 kg/m.     11/15/2021    8:26 AM 10/10/2021   11:17 AM 08/25/2021    4:54 PM 07/24/2021   10:57 AM 06/24/2021   12:00 PM 06/20/2021    7:07 AM 05/30/2021    7:25 AM  Advanced Directives  Does Patient Have a Medical Advance Directive? No No No Yes Yes No Yes  Type of Advance Directive Living will   Living will Living will  Living will  Does patient want to make changes to medical advance directive? No - Patient declined   No - Patient declined No - Patient declined  No - Patient  declined  Would patient like information on creating a medical advance directive? No - Patient declined  No - Patient declined No - Patient declined No - Patient declined No - Patient declined     Current Medications (verified) Outpatient Encounter Medications as of 11/15/2021  Medication Sig   Blood Glucose Monitoring Suppl (ONETOUCH VERIO REFLECT) w/Device KIT Use to test blood sugars daily as directed. DX: E11.9   Cyanocobalamin (B-12 PO) Take 1 tablet by mouth daily.   dutasteride (AVODART) 0.5 MG capsule Take 1 capsule (0.5 mg total) by mouth daily.   famotidine (PEPCID) 20 MG tablet One after supper (Patient taking differently: Take 20 mg by mouth daily.)   fluticasone (FLONASE) 50 MCG/ACT nasal spray PLACE 1 SPRAY INTO BOTH NOSTRILS DAILY AS NEEDED FOR ALLERGIES OR RHINITIS.   Fluticasone-Umeclidin-Vilant (TRELEGY ELLIPTA) 100-62.5-25 MCG/INH AEPB Inhale 1 puff into the lungs daily.   gabapentin (NEURONTIN) 300 MG capsule TAKE 1 CAPSULE BY MOUTH THREE TIMES A DAY (Patient taking differently: Take 300 mg by mouth 3 (three) times daily.)   Iron, Ferrous Sulfate, 325 (65 Fe) MG TABS Take 325 mg by mouth 2 (two) times daily. (Patient taking differently: Take 325 mg by mouth every evening.)   KLOR-CON M20 20 MEQ tablet TAKE 1 TABLET BY MOUTH EVERY DAY (Patient taking differently: Take 20 mEq by mouth daily.)   levothyroxine (SYNTHROID) 50 MCG tablet TAKE 1 TABLET BY MOUTH EVERY DAY (Patient taking differently: Take 50 mcg by mouth daily before breakfast.)   linaclotide (LINZESS) 72 MCG  capsule Take 1 capsule (72 mcg total) by mouth daily as needed (constipation).   Melatonin 10 MG TABS Take 10 mg by mouth at bedtime as needed (sleep).   metolazone (ZAROXOLYN) 2.5 MG tablet Take 1 tablet (2.5 mg total) by mouth 3 (three) times a week.   NON FORMULARY CPAP at bedtime   ondansetron (ZOFRAN) 4 MG tablet Take 1 tablet (4 mg total) by mouth every 8 (eight) hours as needed for nausea or vomiting.    OneTouch Delica Lancets 93Z MISC Use to test blood sugars daily as directed. DX: E11.9   ONETOUCH VERIO test strip USE TO TEST BLOOD SUGARS DAILY AS DIRECTED. DX: E11.9   OXYGEN Inhale 2 L into the lungs continuous.   pantoprazole (PROTONIX) 40 MG tablet Take 1 tablet (40 mg total) by mouth 2 (two) times daily before a meal.   pravastatin (PRAVACHOL) 40 MG tablet TAKE 1 TABLET (40 MG TOTAL) BY MOUTH ON MONDAYS , WEDNESDAYS, AND FRIDAYS AS DIRECTED (Patient taking differently: Take 40 mg by mouth 3 (three) times a week. Mon, wed, and fri)   Semaglutide (RYBELSUS) 7 MG TABS Take 7 mg by mouth daily.   spironolactone (ALDACTONE) 25 MG tablet Take 1 tablet (25 mg total) by mouth daily.   torsemide (DEMADEX) 20 MG tablet TAKE 2 TABLETS (40 MG) IN THE MORNING, TAKE 2 TABLETS (40 MG) IN THE EVENING DAILY AS NEEDED.   zolpidem (AMBIEN) 5 MG tablet Take 1 tablet (5 mg total) by mouth at bedtime as needed for sleep.   No facility-administered encounter medications on file as of 11/15/2021.    Allergies (verified) Sulfa antibiotics, Atorvastatin, Jardiance [empagliflozin], Lopressor [metoprolol], Rosuvastatin, Doxycycline, and Temazepam   History: Past Medical History:  Diagnosis Date   Adenocarcinoma of lung, right ([place]) 04/18/2016   Anemia    Anxiety    Arthritis    Asthma    Atrial flutter (HCC)    BPH (benign prostatic hyperplasia)    with urinary retention 02/06/20   CHF (congestive heart failure) (HCC)    COPD (chronic obstructive pulmonary disease) ([place])    Depression    Diabetes mellitus, type II ([place])    Dyspnea    History of kidney stones    History of radiation therapy    right lung 09/10/2020-09/17/2020   Dr Sondra Come   Hyperlipidemia    Hypertension    Hypothyroidism    Macular degeneration    Neuropathy    Non-small cell lung cancer, right ([place]) 04/18/2016   Peripheral vascular disease ([place])    Prostatitis    Pulmonary nodule, left 07/16/2016   Sleep apnea    cpap   Past  Surgical History:  Procedure Laterality Date   ABDOMINAL AORTOGRAM W/LOWER EXTREMITY Left 02/06/2020   Procedure: ABDOMINAL AORTOGRAM W/LOWER EXTREMITY;  Surgeon: Lorretta Harp, MD;  Location: [facility];  Service: Cardiovascular;  Laterality: Left;   ABDOMINAL AORTOGRAM W/LOWER EXTREMITY N/A 05/30/2021   Procedure: ABDOMINAL AORTOGRAM W/LOWER EXTREMITY;  Surgeon: Marty Heck, MD;  Location: [facility];  Service: Cardiovascular;  Laterality: N/A;   BIOPSY  09/16/2021   Procedure: BIOPSY;  Surgeon: Gatha Mayer, MD;  Location: [place];  Service: Gastroenterology;;   CARDIOVERSION N/A 10/05/2020   Procedure: CARDIOVERSION;  Surgeon: Geralynn Rile, MD;  Location: [place];  Service: Cardiovascular;  Laterality: N/A;   CATARACT EXTRACTION, BILATERAL Bilateral    COLONOSCOPY WITH PROPOFOL N/A 06/20/2021   Procedure: COLONOSCOPY WITH PROPOFOL;  Surgeon: Scarlette Shorts  N, MD;  [facility]: [facility];  Service: Endoscopy;  Laterality: N/A;   COLONOSCOPY WITH PROPOFOL N/A 09/16/2021   Procedure: COLONOSCOPY WITH PROPOFOL;  Surgeon: Gatha Mayer, MD;  Location: [place];  Service: Gastroenterology;  Laterality: N/A;   ENDARTERECTOMY FEMORAL Right 08/20/2020   Procedure: ENDARTERECTOMY  RIGHT FEMORAL ARTERY;  Surgeon: Marty Heck, MD;  Location: [place];  Service: Vascular;  Laterality: Right;   ENTEROSCOPY N/A 10/11/2021   Procedure: ENTEROSCOPY;  Surgeon: Harvel Quale, MD;  [facility];  Service: Gastroenterology;  Laterality: N/A;   ESOPHAGOGASTRODUODENOSCOPY N/A 09/16/2021   Procedure: ESOPHAGOGASTRODUODENOSCOPY (EGD);  Surgeon: Gatha Mayer, MD;  Location: [place];  Service: Gastroenterology;  Laterality: N/A;   ESOPHAGOGASTRODUODENOSCOPY (EGD) WITH PROPOFOL N/A 09/06/2020   Procedure: ESOPHAGOGASTRODUODENOSCOPY (EGD) WITH PROPOFOL;  Surgeon: Irene Shipper, MD;  Location[facility];  Service: Endoscopy;  Laterality:  N/A;   ESOPHAGOGASTRODUODENOSCOPY (EGD) WITH PROPOFOL N/A 10/11/2021   Procedure: ESOPHAGOGASTRODUODENOSCOPY (EGD) WITH PROPOFOL;  Surgeon: Harvel Quale, MD;  [facility];  Service: Gastroenterology;  Laterality: N/A;   HEMOSTASIS CLIP PLACEMENT  09/16/2021   Procedure: HEMOSTASIS CLIP PLACEMENT;  Surgeon: Gatha Mayer, MD;  Location: [place];  Service: Gastroenterology;;   HOT HEMOSTASIS N/A 09/16/2021   Procedure: HOT HEMOSTASIS (ARGON PLASMA COAGULATION/BICAP);  Surgeon: Gatha Mayer, MD;  Location: [place];  Service: Gastroenterology;  Laterality: N/A;   HOT HEMOSTASIS  10/11/2021   Procedure: HOT HEMOSTASIS (ARGON PLASMA COAGULATION/BICAP);  Surgeon: Montez Morita, Quillian Quince, MD;  [facility];  Service: Gastroenterology;;   INSERTION OF ILIAC STENT Right 08/20/2020   Procedure: RETROGRADE INSERTION OF RIGHT ILIAC STENT;  Surgeon: Marty Heck, MD;  Location: [place];  Service: Vascular;  Laterality: Right;   INTRAOPERATIVE ARTERIOGRAM Right 08/20/2020   Procedure: INTRA OPERATIVE ARTERIOGRAM ILIAC;  Surgeon: Marty Heck, MD;  Location: [place];  Service: Vascular;  Laterality: Right;   PATCH ANGIOPLASTY Right 08/20/2020   Procedure: PATCH ANGIOPLASTY RIGHT FEMORAL ARTERY;  Surgeon: Marty Heck, MD;  Location: [place];  Service: Vascular;  Laterality: Right;   PERIPHERAL VASCULAR BALLOON ANGIOPLASTY Right 05/30/2021   Procedure: PERIPHERAL VASCULAR BALLOON ANGIOPLASTY;  Surgeon: Marty Heck, MD;  Location: [facility];  Service: Cardiovascular;  Laterality: Right;   PORTACATH PLACEMENT Left 06/13/2016   Procedure: INSERTION PORT-A-CATH;  Surgeon: Aviva Signs, MD;  [facility];  Service: General;  Laterality: Left;   TRANSURETHRAL RESECTION OF PROSTATE N/A 05/31/2020   Procedure: TRANSURETHRAL RESECTION OF THE PROSTATE (TURP);  Surgeon: Cleon Gustin, MD;  [facility];  Service: Urology;  Laterality:  N/A;   VIDEO BRONCHOSCOPY WITH ENDOBRONCHIAL NAVIGATION N/A 05/28/2016   Procedure: VIDEO BRONCHOSCOPY WITH ENDOBRONCHIAL NAVIGATION;  Surgeon: [name], MD;  Location: [facility];  Service: Thoracic;  Laterality: N/A;   VIDEO BRONCHOSCOPY WITH ENDOBRONCHIAL ULTRASOUND N/A 05/28/2016   Procedure: VIDEO BRONCHOSCOPY WITH ENDOBRONCHIAL ULTRASOUND;  Surgeon: [name], MD;  Location: [facility];  Service: Thoracic;  Laterality: N/A;   Family History  Problem Relation Age of Onset   Hypertension Mother    Diabetes Father    Heart disease Father    Stroke Father    Hypertension Sister    Social History   Socioeconomic History   Marital status: Soil scientist    Spouse name: Not on file   Number of children: 5   Years of education: 10   Highest education level: 10th grade  Occupational History   Occupation: Retired  Tobacco Use   Smoking status: Every Day    Packs/day: 0.50    Years: 55.00    Total pack years: 27.50    Types: Cigarettes    Start date: 03/11/1961   Smokeless tobacco: Never   Tobacco comments:    1/2 pack to 1 pack per day 12/14/19  Vaping Use   Vaping Use: Never used  Substance and Sexual Activity   Alcohol use: No   Drug use: No   Sexual activity: Yes    Birth control/protection: None  Other Topics Concern   Not on file  Social History Narrative   [name] is his POA and lives with him. Children out of town - one in [facility], others in [place].   Social Determinants of Health   Financial Resource Strain: Low Risk  (11/15/2021)   Overall Financial Resource Strain (CARDIA)    Difficulty of Paying Living Expenses: Not hard at all  Food Insecurity: No Food Insecurity (11/15/2021)   Hunger Vital Sign    Worried About Running Out of Food in the Last Year: Never true    Ran Out of Food in the Last Year: Never true  Transportation Needs: No Transportation Needs (11/15/2021)   PRAPARE - Hydrologist (Medical): No    Lack  of Transportation (Non-Medical): No  Physical Activity: Inactive (11/15/2021)   Exercise Vital Sign    Days of Exercise per Week: 0 days    Minutes of Exercise per Session: 0 min  Stress: No Stress Concern Present (11/15/2021)   [place]    Feeling of Stress : Only a little  Social Connections: Moderately Isolated (11/15/2021)   Social Connection and Isolation Panel [NHANES]    Frequency of Communication with Friends and Family: More than three times a week    Frequency of Social Gatherings with Friends and Family: Once a week    Attends Religious Services: Never    Marine scientist or Organizations: No    Attends Music therapist: Never    Marital Status: Living with partner    Tobacco Counseling Ready to quit: Not Answered Counseling given: Not Answered Tobacco comments: 1/2 pack to 1 pack per day 12/14/19   Clinical Intake:  Pre-visit preparation completed: Yes  Pain : No/denies pain     BMI - recorded: 22.6 Nutritional Status: BMI of 19-24  Normal Nutritional Risks: None Diabetes: Yes CBG done?: No Did pt. bring in CBG monitor from home?: No  How often do you need to have someone help you when you read instructions, pamphlets, or other written materials from your doctor or pharmacy?: 1 - Never  Diabetic? Nutrition Risk Assessment:  Has the patient had any N/V/D within the last 2 months?  Yes  Does the patient have any non-healing wounds?  No  Has the patient had any unintentional weight loss or weight gain?  No   Diabetes:  Is the patient diabetic?  Yes  If diabetic, was a CBG obtained today?  No  Did the patient bring in their glucometer from home?  No  How often do you monitor your CBG's? Once daily.   Financial Strains and Diabetes Management:  Are you having any financial strains with the device, your supplies or your medication? No .  Does the patient want to be seen by  Chronic Care Management for management of their diabetes?  No  Would the patient like to be referred to a Nutritionist or for Diabetic Management?  No   Diabetic Exams:  Diabetic Eye Exam: Completed 10/21/2021.   Diabetic Foot Exam: Completed 12/03/2020. Pt has been advised about the importance in completing this exam. Pt is scheduled for diabetic foot exam on 11/25/2021.    Interpreter Needed?: No  Information entered by :: Lashanna Angelo, LPN   Activities of Daily Living    11/15/2021    8:24 AM 10/10/2021    3:01 PM  In your present state of health, do you have any difficulty performing the following activities:  Hearing? 1   Vision? 0   Difficulty concentrating or making decisions? 0   Walking or climbing stairs? 1   Dressing or bathing? 0   Doing errands, shopping? 0 1  Preparing Food and eating ? N   Using the Toilet? N   In the past six months, have you accidently leaked urine? Y   Do you have problems with loss of bowel control? N   Managing your Medications? N   Managing your Finances? N   Housekeeping or managing your Housekeeping? N     Patient Care Team: [name], FNP as PCP - General (Family Medicine) O'Neal, Cassie Freer, MD as PCP - Cardiology (Cardiology) Derek Jack, MD as Consulting Physician (Hematology) Lavera Guise, [facility] (Pharmacist) O'Neal, Cassie Freer, MD as Consulting Physician (Cardiology) McKenzie, Candee Furbish, MD as Consulting Physician (Urology) Celestia Khat, [place] (Optometry) Brita Romp, NP as Nurse Practitioner (Endocrinology) Steffanie Rainwater, DPM as Consulting Physician (Podiatry) Marty Heck, MD as Consulting Physician (Vascular Surgery)  Indicate any recent Medical Services you may have received from other than [facility] providers in the past year (date may be approximate).     Assessment:   This is a routine wellness examination for John [name].  Hearing/Vision screen Hearing Screening - Comments:: C/o mild hearing  difficulties   Vision Screening - Comments:: Wears rx glasses - up to date with routine eye exams with [facility]  Dietary issues and exercise activities discussed: Current Exercise Habits: The patient does not participate in regular exercise at present, Exercise limited by: respiratory conditions(s);cardiac condition(s);orthopedic condition(s);neurologic condition(s)   Goals Addressed             This Visit's Progress    Have 3 meals a day       Try to eat 3 meals daily that consist of lean proteins, fruit and vegetables Wants to get better, feel better - get more energy, so he can do more       Depression Screen    11/15/2021    8:23 AM 10/14/2021   11:58 AM 09/26/2021    3:39 PM 09/05/2021    3:21 PM 08/29/2021    1:54 PM 08/23/2021   10:57 AM 08/23/2021   10:39 AM  PHQ 2/9 Scores  PHQ - 2 Score 0 0 0 0 0 2 0  PHQ- 9 Score _0 Fall Risk    11/15/2021    8:19 AM 10/14/2021   11:59 AM 09/26/2021    3:38 PM 09/19/2021    2:58 PM 09/10/2021    1:52 PM  Fall Risk   Falls in the past year? _1 Number falls in past yr: 1  0 0 0  Injury with Fall? 1 0 _2 Risk for fall due to : History of fall(s);Impaired balance/gait;Orthopedic patient;Medication  side effect  Impaired balance/gait;History of fall(s) Impaired balance/gait;History of fall(s)   Follow up Education provided;Falls prevention discussed  Falls evaluation completed Falls evaluation completed Falls evaluation completed    FALL RISK PREVENTION PERTAINING TO THE HOME:  Any stairs in or around the home? No  If so, are there any without handrails? No  Home free of loose throw rugs in walkways, pet beds, electrical cords, etc? Yes  Adequate lighting in your home to reduce risk of falls? Yes   ASSISTIVE DEVICES UTILIZED TO PREVENT FALLS:  Life alert? No  Use of a cane, walker or w/c? Yes  Grab bars in the bathroom? Yes  Shower chair or bench in shower? Yes  Elevated toilet seat or a handicapped  toilet? No   TIMED UP AND GO:  Was the test performed? No . Telephonic visit  Cognitive Function:    06/17/2017    2:44 PM  MMSE - Mini Mental State Exam  Orientation to time 5  Orientation to Place 4  Registration 3  Attention/ Calculation 5  Recall 3  Language- name 2 objects 2  Language- repeat 1  Language- follow 3 step command 3  Language- read & follow direction 1  Write a sentence 1  Copy design 0  Total score 28        11/15/2021    8:26 AM 05/25/2019   10:37 AM  6CIT Screen  What Year? 0 points 0 points  What month? 0 points 0 points  What time? 0 points 0 points  Count back from 20 0 points 0 points  Months in reverse 0 points 0 points  Repeat phrase 2 points 0 points  Total Score 2 points 0 points    Immunizations Immunization History  Administered Date(s) Administered   Fluad Quad(high Dose 65+) 01/19/2019, 02/01/2020, 01/23/2021   Influenza, High Dose Seasonal PF 12/28/2015   Influenza,inj,Quad PF,6+ Mos 01/23/2017   Moderna Sars-Covid-2 Vaccination 05/10/2019, 06/07/2019, 01/31/2020   Pneumococcal Conjugate-13 06/17/2017   Pneumococcal Polysaccharide-23 04/25/2019   Tdap 05/17/2019   Zoster, Live 07/10/2014    TDAP status: Up to date  Flu Vaccine status: Up to date  Pneumococcal vaccine status: Up to date  Covid-19 vaccine status: Completed vaccines  Qualifies for Shingles Vaccine? Yes   Zostavax completed Yes   Shingrix Completed?: No.    Education has been provided regarding the importance of this vaccine. Patient has been advised to call insurance company to determine out of pocket expense if they have not yet received this vaccine. Advised may also receive vaccine at local pharmacy or Health Dept. Verbalized acceptance and understanding.  Screening Tests Health Maintenance  Topic Date Due   COVID-19 Vaccine (4 - Moderna risk series) 03/27/2020   INFLUENZA VACCINE  11/05/2021   Zoster Vaccines- Shingrix (1 of 2) 11/23/2021 (Originally  03/11/1965)   FOOT EXAM  12/03/2021   HEMOGLOBIN A1C  02/25/2022   URINE MICROALBUMIN  08/15/2022   OPHTHALMOLOGY EXAM  10/22/2022   Fecal DNA (Cologuard)  03/10/2024   TETANUS/TDAP  05/16/2029   Pneumonia Vaccine 7+ Years old  Completed   Hepatitis C Screening  Completed   HPV VACCINES  Aged Out    Health Maintenance  Health Maintenance Due  Topic Date Due   COVID-19 Vaccine (4 - Moderna risk series) 03/27/2020   INFLUENZA VACCINE  11/05/2021    Colorectal cancer screening: Type of screening: Cologuard. Completed 03/10/2021. Repeat every 3 years  Lung Cancer Screening: (Low Dose CT Chest recommended if Age  55-80 years, 30 pack-year currently smoking OR have quit w/in 15years.) does not qualify  Additional Screening:  Hepatitis C Screening: does qualify; Completed 12/29/2017  Vision Screening: Recommended annual ophthalmology exams for early detection of glaucoma and other disorders of the eye. Is the patient up to date with their annual eye exam?  Yes  Who is the provider or what is the name of the office in which the patient attends annual eye exams? [facility] If pt is not established with a provider, would they like to be referred to a provider to establish care? No .   Dental Screening: Recommended annual dental exams for proper oral hygiene  Community Resource Referral / Chronic Care Management: CRR required this visit?  No   CCM required this visit?  No      Plan:     I have personally reviewed and noted the following in the patient's chart:   Medical and social history Use of alcohol, tobacco or illicit drugs  Current medications and supplements including opioid prescriptions. Patient is not currently taking opioid prescriptions. Functional ability and status Nutritional status Physical activity Advanced directives List of other physicians Hospitalizations, surgeries, and ER visits in previous 12 months Vitals Screenings to include cognitive,  depression, and falls Referrals and appointments  In addition, I have reviewed and discussed with patient certain preventive protocols, quality metrics, and best practice recommendations. A written personalized care plan for preventive services as well as general preventive health recommendations were provided to patient.     Sandrea Hammond, LPN   [id number]/[id number]   Nurse Notes: He is still pretty weak and stumbling, near falling a lot - PT didn't seem to help - he was pretty anemic when he had labs drawn 7/10 - He'd like to come Monday to recheck to see if this is any better. Separate phone note sent to PCP

## 2021-11-15 NOTE — Telephone Encounter (Signed)
Patient aware and verbalized understanding. °

## 2021-11-15 NOTE — Patient Instructions (Addendum)
Mr. Brow , Thank you for taking time to come for your Medicare Wellness Visit. I appreciate your ongoing commitment to your health goals. Please review the following plan we discussed and let me know if I can assist you in the future.   Screening recommendations/referrals: Colonoscopy: Colguard done 03/10/2021 - Repeat in 3 years    Recommended yearly ophthalmology/optometry visit for glaucoma screening and checkup Recommended yearly dental visit for hygiene and checkup  Vaccinations: Influenza vaccine: Done 01/23/2021 - Repeat annually  Pneumococcal vaccine: Done 06/17/2017 & 04/25/2019 Tdap vaccine: Done 05/17/2019 - Repeat in 10 years  Shingles vaccine: Zostavax done 2016 - due for Shingrix which is 2 doses 2-6 months apart and over 90% effective     Covid-19: Done 05/10/2019, 06/07/2019, 01/31/2020  Advanced directives: Please bring a copy of your health care power of attorney to the office to be added to your chart at your  convenience.  Advance directive/ living will discussed with you today. Even though you declined this today, please call our office should you change your mind, and we can give you the proper paperwork for you to fill out.   Conditions/risks identified: Aim for 4-6 glasses of water, plenty of protein in your diet and try to get up and walk/ stretch every hour for 5-10 minutes at a time. Review the tips on eating an iron rich diet to help your anemia, help get your strength back.  Next appointment: Follow up in one year for your annual wellness visit.   Preventive Care 76 Years and Older, Male  Preventive care refers to lifestyle choices and visits with your health care provider that can promote health and wellness. What does preventive care include? A yearly physical exam. This is also called an annual well check. Dental exams once or twice a year. Routine eye exams. Ask your health care provider how often you should have your eyes checked. Personal lifestyle choices,  including: Daily care of your teeth and gums. Regular physical activity. Eating a healthy diet. Avoiding tobacco and drug use. Limiting alcohol use. Practicing safe sex. Taking low doses of aspirin every day. Taking vitamin and mineral supplements as recommended by your health care provider. What happens during an annual well check? The services and screenings done by your health care provider during your annual well check will depend on your age, overall health, lifestyle risk factors, and family history of disease. Counseling  Your health care provider may ask you questions about your: Alcohol use. Tobacco use. Drug use. Emotional well-being. Home and relationship well-being. Sexual activity. Eating habits. History of falls. Memory and ability to understand (cognition). Work and work Statistician. Screening  You may have the following tests or measurements: Height, weight, and BMI. Blood pressure. Lipid and cholesterol levels. These may be checked every 5 years, or more frequently if you are over 31 years old. Skin check. Lung cancer screening. You may have this screening every year starting at age 50 if you have a 30-pack-year history of smoking and currently smoke or have quit within the past 15 years. Fecal occult blood test (FOBT) of the stool. You may have this test every year starting at age 66. Flexible sigmoidoscopy or colonoscopy. You may have a sigmoidoscopy every 5 years or a colonoscopy every 10 years starting at age 25. Prostate cancer screening. Recommendations will vary depending on your family history and other risks. Hepatitis C blood test. Hepatitis B blood test. Sexually transmitted disease (STD) testing. Diabetes screening. This is done by  checking your blood sugar (glucose) after you have not eaten for a while (fasting). You may have this done every 1-3 years. Abdominal aortic aneurysm (AAA) screening. You may need this if you are a current or former  smoker. Osteoporosis. You may be screened starting at age 110 if you are at high risk. Talk with your health care provider about your test results, treatment options, and if necessary, the need for more tests. Vaccines  Your health care provider may recommend certain vaccines, such as: Influenza vaccine. This is recommended every year. Tetanus, diphtheria, and acellular pertussis (Tdap, Td) vaccine. You may need a Td booster every 10 years. Zoster vaccine. You may need this after age 74. Pneumococcal 13-valent conjugate (PCV13) vaccine. One dose is recommended after age 33. Pneumococcal polysaccharide (PPSV23) vaccine. One dose is recommended after age 75. Talk to your health care provider about which screenings and vaccines you need and how often you need them. This information is not intended to replace advice given to you by your health care provider. Make sure you discuss any questions you have with your health care provider. Document Released: 04/20/2015 Document Revised: 12/12/2015 Document Reviewed: 01/23/2015 Elsevier Interactive Patient Education  2017 [place] Prevention in the Home Falls can cause injuries. They can happen to people of all ages. There are many things you can do to make your home safe and to help prevent falls. What can I do on the outside of my home? Regularly fix the edges of walkways and driveways and fix any cracks. Remove anything that might make you trip as you walk through a door, such as a raised step or threshold. Trim any bushes or trees on the path to your home. Use bright outdoor lighting. Clear any walking paths of anything that might make someone trip, such as rocks or tools. Regularly check to see if handrails are loose or broken. Make sure that both sides of any steps have handrails. Any raised decks and porches should have guardrails on the edges. Have any leaves, snow, or ice cleared regularly. Use sand or salt on walking paths during  winter. Clean up any spills in your garage right away. This includes oil or grease spills. What can I do in the bathroom? Use night lights. Install grab bars by the toilet and in the tub and shower. Do not use towel bars as grab bars. Use non-skid mats or decals in the tub or shower. If you need to sit down in the shower, use a plastic, non-slip stool. Keep the floor dry. Clean up any water that spills on the floor as soon as it happens. Remove soap buildup in the tub or shower regularly. Attach bath mats securely with double-sided non-slip rug tape. Do not have throw rugs and other things on the floor that can make you trip. What can I do in the bedroom? Use night lights. Make sure that you have a light by your bed that is easy to reach. Do not use any sheets or blankets that are too big for your bed. They should not hang down onto the floor. Have a firm chair that has side arms. You can use this for support while you get dressed. Do not have throw rugs and other things on the floor that can make you trip. What can I do in the kitchen? Clean up any spills right away. Avoid walking on wet floors. Keep items that you use a lot in easy-to-reach places. If you need to reach  something above you, use a strong step stool that has a grab bar. Keep electrical cords out of the way. Do not use floor polish or wax that makes floors slippery. If you must use wax, use non-skid floor wax. Do not have throw rugs and other things on the floor that can make you trip. What can I do with my stairs? Do not leave any items on the stairs. Make sure that there are handrails on both sides of the stairs and use them. Fix handrails that are broken or loose. Make sure that handrails are as long as the stairways. Check any carpeting to make sure that it is firmly attached to the stairs. Fix any carpet that is loose or worn. Avoid having throw rugs at the top or bottom of the stairs. If you do have throw rugs,  attach them to the floor with carpet tape. Make sure that you have a light switch at the top of the stairs and the bottom of the stairs. If you do not have them, ask someone to add them for you. What else can I do to help prevent falls? Wear shoes that: Do not have high heels. Have rubber bottoms. Are comfortable and fit you well. Are closed at the toe. Do not wear sandals. If you use a stepladder: Make sure that it is fully opened. Do not climb a closed stepladder. Make sure that both sides of the stepladder are locked into place. Ask someone to hold it for you, if possible. Clearly mark and make sure that you can see: Any grab bars or handrails. First and last steps. Where the edge of each step is. Use tools that help you move around (mobility aids) if they are needed. These include: Canes. Walkers. Scooters. Crutches. Turn on the lights when you go into a dark area. Replace any light bulbs as soon as they burn out. Set up your furniture so you have a clear path. Avoid moving your furniture around. If any of your floors are uneven, fix them. If there are any pets around you, be aware of where they are. Review your medicines with your doctor. Some medicines can make you feel dizzy. This can increase your chance of falling. Ask your doctor what other things that you can do to help prevent falls. This information is not intended to replace advice given to you by your health care provider. Make sure you discuss any questions you have with your health care provider. Document Released: 01/18/2009 Document Revised: 08/30/2015 Document Reviewed: 04/28/2014 Elsevier Interactive Patient Education  2017 [place].   Iron-Rich Diet to help your anemia  Iron is a mineral that helps your body produce hemoglobin. Hemoglobin is a protein in red blood cells that carries oxygen to your body's tissues. Eating too little iron may cause you to feel weak and tired, and it can increase your risk of  infection. Iron is naturally found in many foods, and many foods have iron added to them (are iron-fortified). You may need to follow an iron-rich diet if you do not have enough iron in your body due to certain medical conditions. The amount of iron that you need each day depends on your age, your sex, and any medical conditions you have. Follow instructions from your health care provider or a dietitian about how much iron you should eat each day. What are tips for following this plan? Reading food labels Check food labels to see how many milligrams (mg) of iron are in each  serving. Cooking Cook foods in pots and pans that are made from iron. Take these steps to make it easier for your body to absorb iron from certain foods: Soak beans overnight before cooking. Soak whole grains overnight and drain them before using. Ferment flours before baking, such as by using yeast in bread dough. Meal planning When you eat foods that contain iron, you should eat them with foods that are high in vitamin C. These include oranges, peppers, tomatoes, potatoes, and mangoes. Vitamin C helps your body absorb iron. Certain foods and drinks prevent your body from absorbing iron properly. Avoid eating these foods in the same meal as iron-rich foods or with iron supplements. These foods include: Coffee, black tea, and red wine. Milk, dairy products, and foods that are high in calcium. Beans and soybeans. Whole grains. General information Take iron supplements only as told by your health care provider. An overdose of iron can be life-threatening. If you were prescribed iron supplements, take them with orange juice or a vitamin C supplement. When you eat iron-fortified foods or take an iron supplement, you should also eat foods that naturally contain iron, such as meat, poultry, and fish. Eating naturally iron-rich foods helps your body absorb the iron that is added to other foods or contained in a supplement. Iron from  animal sources is better absorbed than iron from plant sources. What foods should I eat? Fruits Prunes. Raisins. Eat fruits high in vitamin C, such as oranges, grapefruits, and strawberries, with iron-rich foods. Vegetables Spinach (cooked). Green peas. Broccoli. Fermented vegetables. Eat vegetables high in vitamin C, such as leafy greens, potatoes, bell peppers, and tomatoes, with iron-rich foods. Grains Iron-fortified breakfast cereal. Iron-fortified whole-wheat bread. Enriched rice. Sprouted grains. Meats and other proteins Beef liver. Beef. [place]. Chicken. Oysters. Shrimp. [place]. Sardines. Chickpeas. Nuts. Tofu. Pumpkin seeds. Beverages Tomato juice. Fresh orange juice. Prune juice. Hibiscus tea. Iron-fortified instant breakfast shakes. Sweets and desserts Blackstrap molasses. Seasonings and condiments Tahini. Fermented soy sauce. Other foods Wheat germ. The items listed above may not be a complete list of recommended foods and beverages. Contact a dietitian for more information. What foods should I limit? These are foods that should be limited while eating iron-rich foods as they can reduce the absorption of iron in your body. Grains Whole grains. Bran cereal. Bran flour. Meats and other proteins Soybeans. Products made from soy protein. Black beans. Lentils. Mung beans. Split peas. Dairy Milk. Cream. Cheese. Yogurt. Cottage cheese. Beverages Coffee. Black tea. Red wine. Sweets and desserts Cocoa. Chocolate. Ice cream. Seasonings and condiments Basil. Oregano. Large amounts of parsley. The items listed above may not be a complete list of foods and beverages you should limit. Contact a dietitian for more information. Summary Iron is a mineral that helps your body produce hemoglobin. Hemoglobin is a protein in red blood cells that carries oxygen to your body's tissues. Iron is naturally found in many foods, and many foods have iron added to them (are iron-fortified). When  you eat foods that contain iron, you should eat them with foods that are high in vitamin C. Vitamin C helps your body absorb iron. Certain foods and drinks prevent your body from absorbing iron properly, such as whole grains and dairy products. You should avoid eating these foods in the same meal as iron-rich foods or with iron supplements. This information is not intended to replace advice given to you by your health care provider. Make sure you discuss any questions you have with your health  care provider. Document Revised: 03/05/2020 Document Reviewed: 03/05/2020 Elsevier Patient Education  [place].

## 2021-11-20 DIAGNOSIS — D62 Acute posthemorrhagic anemia: Secondary | ICD-10-CM | POA: Diagnosis not present

## 2021-11-20 DIAGNOSIS — I5033 Acute on chronic diastolic (congestive) heart failure: Secondary | ICD-10-CM | POA: Diagnosis not present

## 2021-11-20 DIAGNOSIS — I152 Hypertension secondary to endocrine disorders: Secondary | ICD-10-CM | POA: Diagnosis not present

## 2021-11-20 DIAGNOSIS — J449 Chronic obstructive pulmonary disease, unspecified: Secondary | ICD-10-CM | POA: Diagnosis not present

## 2021-11-20 DIAGNOSIS — E1159 Type 2 diabetes mellitus with other circulatory complications: Secondary | ICD-10-CM | POA: Diagnosis not present

## 2021-11-20 DIAGNOSIS — K922 Gastrointestinal hemorrhage, unspecified: Secondary | ICD-10-CM | POA: Diagnosis not present

## 2021-11-22 ENCOUNTER — Other Ambulatory Visit: Payer: Self-pay | Admitting: Family

## 2021-11-22 DIAGNOSIS — K922 Gastrointestinal hemorrhage, unspecified: Secondary | ICD-10-CM | POA: Diagnosis not present

## 2021-11-22 DIAGNOSIS — I152 Hypertension secondary to endocrine disorders: Secondary | ICD-10-CM | POA: Diagnosis not present

## 2021-11-22 DIAGNOSIS — I5033 Acute on chronic diastolic (congestive) heart failure: Secondary | ICD-10-CM | POA: Diagnosis not present

## 2021-11-22 DIAGNOSIS — D62 Acute posthemorrhagic anemia: Secondary | ICD-10-CM | POA: Diagnosis not present

## 2021-11-22 DIAGNOSIS — J449 Chronic obstructive pulmonary disease, unspecified: Secondary | ICD-10-CM | POA: Diagnosis not present

## 2021-11-22 DIAGNOSIS — E1159 Type 2 diabetes mellitus with other circulatory complications: Secondary | ICD-10-CM | POA: Diagnosis not present

## 2021-11-25 ENCOUNTER — Ambulatory Visit (INDEPENDENT_AMBULATORY_CARE_PROVIDER_SITE_OTHER): Payer: Medicare Other | Admitting: Family

## 2021-11-25 VITALS — BP 134/73 | HR 89 | Temp 97.5°F | Ht 66.0 in | Wt 145.0 lb

## 2021-11-25 DIAGNOSIS — K5900 Constipation, unspecified: Secondary | ICD-10-CM

## 2021-11-25 DIAGNOSIS — E1142 Type 2 diabetes mellitus with diabetic polyneuropathy: Secondary | ICD-10-CM

## 2021-11-25 DIAGNOSIS — I152 Hypertension secondary to endocrine disorders: Secondary | ICD-10-CM

## 2021-11-25 DIAGNOSIS — D509 Iron deficiency anemia, unspecified: Secondary | ICD-10-CM

## 2021-11-25 DIAGNOSIS — Z23 Encounter for immunization: Secondary | ICD-10-CM

## 2021-11-25 DIAGNOSIS — M159 Polyosteoarthritis, unspecified: Secondary | ICD-10-CM | POA: Diagnosis not present

## 2021-11-25 DIAGNOSIS — E1169 Type 2 diabetes mellitus with other specified complication: Secondary | ICD-10-CM

## 2021-11-25 DIAGNOSIS — Z9989 Dependence on other enabling machines and devices: Secondary | ICD-10-CM

## 2021-11-25 DIAGNOSIS — N401 Enlarged prostate with lower urinary tract symptoms: Secondary | ICD-10-CM | POA: Diagnosis not present

## 2021-11-25 DIAGNOSIS — D649 Anemia, unspecified: Secondary | ICD-10-CM

## 2021-11-25 DIAGNOSIS — I5033 Acute on chronic diastolic (congestive) heart failure: Secondary | ICD-10-CM

## 2021-11-25 DIAGNOSIS — I739 Peripheral vascular disease, unspecified: Secondary | ICD-10-CM | POA: Diagnosis not present

## 2021-11-25 DIAGNOSIS — J449 Chronic obstructive pulmonary disease, unspecified: Secondary | ICD-10-CM

## 2021-11-25 DIAGNOSIS — E1159 Type 2 diabetes mellitus with other circulatory complications: Secondary | ICD-10-CM | POA: Diagnosis not present

## 2021-11-25 DIAGNOSIS — G4733 Obstructive sleep apnea (adult) (pediatric): Secondary | ICD-10-CM | POA: Diagnosis not present

## 2021-11-25 DIAGNOSIS — K219 Gastro-esophageal reflux disease without esophagitis: Secondary | ICD-10-CM | POA: Diagnosis not present

## 2021-11-25 DIAGNOSIS — R5383 Other fatigue: Secondary | ICD-10-CM

## 2021-11-25 DIAGNOSIS — E039 Hypothyroidism, unspecified: Secondary | ICD-10-CM

## 2021-11-25 DIAGNOSIS — E782 Mixed hyperlipidemia: Secondary | ICD-10-CM | POA: Diagnosis not present

## 2021-11-25 DIAGNOSIS — Z720 Tobacco use: Secondary | ICD-10-CM

## 2021-11-25 DIAGNOSIS — N138 Other obstructive and reflux uropathy: Secondary | ICD-10-CM

## 2021-11-25 NOTE — Patient Instructions (Signed)
Managing the Challenge of Quitting Smoking Quitting smoking is a physical and mental challenge. You may have cravings, withdrawal symptoms, and temptation to smoke. Before quitting, work with your health care provider to make a plan that can help you manage quitting. Making a plan before you quit may keep you from smoking when you have the urge to smoke while trying to quit. How to manage lifestyle changes Managing stress Stress can make you want to smoke, and wanting to smoke may cause stress. It is important to find ways to manage your stress. You could try some of the following: Practice relaxation techniques. Breathe slowly and deeply, in through your nose and out through your mouth. Listen to music. Soak in a bath or take a shower. Imagine a peaceful place or vacation. Get some support. Talk with family or friends about your stress. Join a support group. Talk with a counselor or therapist. Get some physical activity. Go for a walk, run, or bike ride. Play a favorite sport. Practice yoga.  Medicines Talk with your health care provider about medicines that might help you deal with cravings and make quitting easier for you. Relationships Social situations can be difficult when you are quitting smoking. To manage this, you can: Avoid parties and other social situations where people might be smoking. Avoid alcohol. Leave right away if you have the urge to smoke. Explain to your family and friends that you are quitting smoking. Ask for support and let them know you might be a bit grumpy. Plan activities where smoking is not an option. General instructions Be aware that many people gain weight after they quit smoking. However, not everyone does. To keep from gaining weight, have a plan in place before you quit, and stick to the plan after you quit. Your plan should include: Eating healthy snacks. When you have a craving, it may help to: Eat popcorn, or try carrots, celery, or other cut  vegetables. Chew sugar-free gum. Changing how you eat. Eat small portion sizes at meals. Eat 4-6 small meals throughout the day instead of 1-2 large meals a day. Be mindful when you eat. You should avoid watching television or doing other things that might distract you as you eat. Exercising regularly. Make time to exercise each day. If you do not have time for a long workout, do short bouts of exercise for 5-10 minutes several times a day. Do some form of strengthening exercise, such as weight lifting. Do some exercise that gets your heart beating and causes you to breathe deeply, such as walking fast, running, swimming, or biking. This is very important. Drinking plenty of water or other low-calorie or no-calorie drinks. Drink enough fluid to keep your urine pale yellow.  How to recognize withdrawal symptoms Your body and mind may experience discomfort as you try to get used to not having nicotine in your system. These effects are called withdrawal symptoms. They may include: Feeling hungrier than normal. Having trouble concentrating. Feeling irritable or restless. Having trouble sleeping. Feeling depressed. Craving a cigarette. These symptoms may surprise you, but they are normal to have when quitting smoking. To manage withdrawal symptoms: Avoid places, people, and activities that trigger your cravings. Remember why you want to quit. Get plenty of sleep. Avoid coffee and other drinks that contain caffeine. These may worsen some of your symptoms. How to manage cravings Come up with a plan for how to deal with your cravings. The plan should include the following: A definition of the specific situation   you want to deal with. An activity or action you will take to replace smoking. A clear idea for how this action will help. The name of someone who could help you with this. Cravings usually last for 5-10 minutes. Consider taking the following actions to help you with your plan to deal  with cravings: Keep your mouth busy. Chew sugar-free gum. Suck on hard candies or a straw. Brush your teeth. Keep your hands and body busy. Change to a different activity right away. Squeeze or play with a ball. Do an activity or a hobby, such as making bead jewelry, practicing needlepoint, or working with wood. Mix up your normal routine. Take a short exercise break. Go for a quick walk, or run up and down stairs. Focus on doing something kind or helpful for someone else. Call a friend or family member to talk during a craving. Join a support group. Contact a quitline. Where to find support To get help or find a support group: Call the National Cancer Institute's Smoking Quitline: 1-800-QUIT-NOW (784-8669) Text QUIT to SmokefreeTXT: 478848 Where to find more information Visit these websites to find more information on quitting smoking: U.S. Department of Health and Human Services: www.smokefree.gov American Lung Association: www.freedomfromsmoking.org Centers for Disease Control and Prevention (CDC): www.cdc.gov American Heart Association: www.heart.org Contact a health care provider if: You want to change your plan for quitting. The medicines you are taking are not helping. Your eating feels out of control or you cannot sleep. You feel depressed or become very anxious. Summary Quitting smoking is a physical and mental challenge. You will face cravings, withdrawal symptoms, and temptation to smoke again. Preparation can help you as you go through these challenges. Try different techniques to manage stress, handle social situations, and prevent weight gain. You can deal with cravings by keeping your mouth busy (such as by chewing gum), keeping your hands and body busy, calling family or friends, or contacting a quitline for people who want to quit smoking. You can deal with withdrawal symptoms by avoiding places where people smoke, getting plenty of rest, and avoiding drinks that  contain caffeine. This information is not intended to replace advice given to you by your health care provider. Make sure you discuss any questions you have with your health care provider. Document Revised: 03/15/2021 Document Reviewed: 03/15/2021 Elsevier Patient Education  2023 [employer]  

## 2021-11-25 NOTE — Addendum Note (Signed)
Addended by: Thana Ates on: 11/25/2021 11:37 AM   Modules accepted: Orders

## 2021-11-25 NOTE — Progress Notes (Signed)
Subjective:    Patient ID: John [name], male    DOB: 07/10/1945, 76 y.o.   MRN: [id number]  Chief Complaint  Patient presents with   Medical Management of Chronic Issues    Leg cramps and pressure in ears   Pt presents to the office today for  chronic care follow up. He is followed by Cardiologists  for CHF. Followed by Oncologists every 6 months for non-small lung cancer. He completed radiation.    He is followed by Pulmonologist every 6 months for COPD. He continues to smoke 1/2 and complaining of increased SOB.  He is followed by Vascular for PAD.    He is followed by Urologists for BPH and Prostate.   He is followed by GI for bleed.   Has OSA and using CPAP nightly.   Hypertension This is a chronic problem. The current episode started more than 1 year ago. The problem has been resolved since onset. The problem is controlled. Associated symptoms include blurred vision, malaise/fatigue and shortness of breath. Pertinent negatives include no peripheral edema. Risk factors for coronary artery disease include dyslipidemia, diabetes mellitus, obesity, male gender, smoking/tobacco exposure and sedentary lifestyle. The current treatment provides moderate improvement. Hypertensive end-organ damage includes heart failure. Identifiable causes of hypertension include a thyroid problem.  Congestive Heart Failure Presents for follow-up visit. Associated symptoms include edema, fatigue, nocturia and shortness of breath. The symptoms have been stable.  Gastroesophageal Reflux He complains of belching, heartburn and a hoarse voice. This is a chronic problem. The current episode started more than 1 year ago. The problem occurs occasionally. Associated symptoms include fatigue. He has tried a PPI for the symptoms. The treatment provided moderate relief.  Diabetes He presents for his follow-up diabetic visit. He has type 2 diabetes mellitus. Associated symptoms include blurred vision, fatigue and  foot paresthesias. Symptoms are stable. Diabetic complications include heart disease and peripheral neuropathy. Risk factors for coronary artery disease include dyslipidemia, diabetes mellitus, male sex, hypertension and sedentary lifestyle. He is following a generally healthy diet. His overall blood glucose range is 110-130 mg/dl. Eye exam is current.  Thyroid Problem Presents for follow-up visit. Symptoms include fatigue and hoarse voice. Patient reports no constipation, depressed mood, diarrhea or dry skin. The symptoms have been stable. His past medical history is significant for heart failure.  Arthritis Presents for follow-up visit. He complains of pain and stiffness. The symptoms have been stable. Affected locations include the left hip and right hip. His pain is at a severity of 5/10. Associated symptoms include fatigue. Pertinent negatives include no diarrhea.  Benign Prostatic Hypertrophy This is a chronic problem. The current episode started more than 1 year ago. Irritative symptoms include nocturia.  Nicotine Dependence Presents for follow-up visit. Symptoms include fatigue. His urge triggers include company of smokers. The symptoms have been stable. He smokes < 1/2 a pack of cigarettes per day.  Anemia Presents for follow-up visit. Symptoms include bruises/bleeds easily and malaise/fatigue. Past medical history includes heart failure.      Review of Systems  Constitutional:  Positive for fatigue and malaise/fatigue.  HENT:  Positive for hoarse voice.   Eyes:  Positive for blurred vision.  Respiratory:  Positive for shortness of breath.   Gastrointestinal:  Positive for heartburn. Negative for constipation and diarrhea.  Genitourinary:  Positive for nocturia.  Musculoskeletal:  Positive for arthritis and stiffness.  Hematological:  Bruises/bleeds easily.  All other systems reviewed and are negative.      Objective:  Physical Exam Vitals reviewed.  Constitutional:       General: He is not in acute distress.    Appearance: He is well-developed.  HENT:     Head: Normocephalic.     Right Ear: Tympanic membrane normal.     Left Ear: Tympanic membrane normal.  Eyes:     General:        Right eye: No discharge.        Left eye: No discharge.     Pupils: Pupils are equal, round, and reactive to light.  Neck:     Thyroid: No thyromegaly.  Cardiovascular:     Rate and Rhythm: Normal rate and regular rhythm.     Heart sounds: Murmur heard.  Pulmonary:     Effort: Pulmonary effort is normal. No respiratory distress.     Breath sounds: Normal breath sounds. No wheezing.  Abdominal:     General: Bowel sounds are normal. There is no distension.     Palpations: Abdomen is soft.     Tenderness: There is no abdominal tenderness.  Musculoskeletal:        General: No tenderness. Normal range of motion.     Cervical back: Normal range of motion and neck supple.  Skin:    General: Skin is warm and dry.     Findings: No erythema or rash.  Neurological:     Mental Status: He is alert and oriented to person, place, and time.     Cranial Nerves: No cranial nerve deficit.     Motor: Weakness (using cane) present.     Deep Tendon Reflexes: Reflexes are normal and symmetric.  Psychiatric:        Behavior: Behavior normal.        Thought Content: Thought content normal.        Judgment: Judgment normal.       BP 134/73   Pulse 89   Temp (!) 97.5 F (36.4 C) (Temporal)   Ht _0  (1.676 m)   Wt 145 lb (65.8 kg)   SpO2 98%   BMI 23.40 kg/m      Assessment & Plan:  John [name] comes in today with chief complaint of Medical Management of Chronic Issues (Leg cramps and pressure in ears)   Diagnosis and orders addressed:  1. Acute on chronic heart failure with preserved ejection fraction (HFpEF) ([place]) - CMP14+EGFR  2. Hypertension associated with diabetes ([place]) - CMP14+EGFR  3. PAD (peripheral artery disease) (HCC) - CMP14+EGFR  4. Chronic  obstructive pulmonary disease, unspecified COPD type ([place]) - CMP14+EGFR  5. Obstructive sleep apnea treated with continuous positive airway pressure (CPAP) - CMP14+EGFR  6. GERD without esophagitis - CMP14+EGFR  7. Diabetic polyneuropathy associated with type 2 diabetes mellitus (HCC) - CMP14+EGFR  8. Hypothyroidism, unspecified type - CMP14+EGFR - TSH  9. Mixed diabetic hyperlipidemia associated with type 2 diabetes mellitus (HCC) - CMP14+EGFR  10. Primary osteoarthritis involving multiple joints - CMP14+EGFR  11. BPH with urinary obstruction - CMP14+EGFR  12. Constipation, unspecified constipation type - CMP14+EGFR  13. Acute on chronic anemia - CMP14+EGFR  14. Iron deficiency anemia, unspecified iron deficiency anemia type  - CMP14+EGFR - Anemia Profile B  15. Tobacco abuse - CMP14+EGFR   Labs pending Health Maintenance reviewed Diet and exercise encouraged  Follow up plan: 3 month s  Evelina Dun, FNP

## 2021-11-26 ENCOUNTER — Inpatient Hospital Stay (HOSPITAL_COMMUNITY): Admission: RE | Admit: 2021-11-26 | Payer: Medicare Other | Source: Ambulatory Visit

## 2021-11-26 ENCOUNTER — Encounter (HOSPITAL_COMMUNITY): Payer: Medicare Other

## 2021-11-26 ENCOUNTER — Ambulatory Visit: Payer: Medicare Other | Admitting: Vascular Surgery

## 2021-11-26 DIAGNOSIS — I5033 Acute on chronic diastolic (congestive) heart failure: Secondary | ICD-10-CM | POA: Diagnosis not present

## 2021-11-26 DIAGNOSIS — F32A Depression, unspecified: Secondary | ICD-10-CM | POA: Diagnosis not present

## 2021-11-26 DIAGNOSIS — E1159 Type 2 diabetes mellitus with other circulatory complications: Secondary | ICD-10-CM | POA: Diagnosis not present

## 2021-11-26 DIAGNOSIS — R338 Other retention of urine: Secondary | ICD-10-CM | POA: Diagnosis not present

## 2021-11-26 DIAGNOSIS — D630 Anemia in neoplastic disease: Secondary | ICD-10-CM | POA: Diagnosis not present

## 2021-11-26 DIAGNOSIS — E1165 Type 2 diabetes mellitus with hyperglycemia: Secondary | ICD-10-CM | POA: Diagnosis not present

## 2021-11-26 DIAGNOSIS — E1151 Type 2 diabetes mellitus with diabetic peripheral angiopathy without gangrene: Secondary | ICD-10-CM | POA: Diagnosis not present

## 2021-11-26 DIAGNOSIS — H3530 Unspecified macular degeneration: Secondary | ICD-10-CM | POA: Diagnosis not present

## 2021-11-26 DIAGNOSIS — F419 Anxiety disorder, unspecified: Secondary | ICD-10-CM | POA: Diagnosis not present

## 2021-11-26 DIAGNOSIS — G4733 Obstructive sleep apnea (adult) (pediatric): Secondary | ICD-10-CM | POA: Diagnosis not present

## 2021-11-26 DIAGNOSIS — K922 Gastrointestinal hemorrhage, unspecified: Secondary | ICD-10-CM | POA: Diagnosis not present

## 2021-11-26 DIAGNOSIS — E782 Mixed hyperlipidemia: Secondary | ICD-10-CM | POA: Diagnosis not present

## 2021-11-26 DIAGNOSIS — N401 Enlarged prostate with lower urinary tract symptoms: Secondary | ICD-10-CM | POA: Diagnosis not present

## 2021-11-26 DIAGNOSIS — M1990 Unspecified osteoarthritis, unspecified site: Secondary | ICD-10-CM | POA: Diagnosis not present

## 2021-11-26 DIAGNOSIS — T451X5D Adverse effect of antineoplastic and immunosuppressive drugs, subsequent encounter: Secondary | ICD-10-CM | POA: Diagnosis not present

## 2021-11-26 DIAGNOSIS — D62 Acute posthemorrhagic anemia: Secondary | ICD-10-CM | POA: Diagnosis not present

## 2021-11-26 DIAGNOSIS — J449 Chronic obstructive pulmonary disease, unspecified: Secondary | ICD-10-CM | POA: Diagnosis not present

## 2021-11-26 DIAGNOSIS — E032 Hypothyroidism due to medicaments and other exogenous substances: Secondary | ICD-10-CM | POA: Diagnosis not present

## 2021-11-26 DIAGNOSIS — J9611 Chronic respiratory failure with hypoxia: Secondary | ICD-10-CM | POA: Diagnosis not present

## 2021-11-26 DIAGNOSIS — I4892 Unspecified atrial flutter: Secondary | ICD-10-CM | POA: Diagnosis not present

## 2021-11-26 DIAGNOSIS — I152 Hypertension secondary to endocrine disorders: Secondary | ICD-10-CM | POA: Diagnosis not present

## 2021-11-26 DIAGNOSIS — C3491 Malignant neoplasm of unspecified part of right bronchus or lung: Secondary | ICD-10-CM | POA: Diagnosis not present

## 2021-11-26 DIAGNOSIS — E1140 Type 2 diabetes mellitus with diabetic neuropathy, unspecified: Secondary | ICD-10-CM | POA: Diagnosis not present

## 2021-11-26 DIAGNOSIS — I480 Paroxysmal atrial fibrillation: Secondary | ICD-10-CM | POA: Diagnosis not present

## 2021-11-26 DIAGNOSIS — E1169 Type 2 diabetes mellitus with other specified complication: Secondary | ICD-10-CM | POA: Diagnosis not present

## 2021-11-26 LAB — CMP14+EGFR
ALT: 20 IU/L (ref 0–44)
AST: 22 IU/L (ref 0–40)
Albumin/Globulin Ratio: 1.3 (ref 1.2–2.2)
Albumin: 4.1 g/dL (ref 3.8–4.8)
Alkaline Phosphatase: 171 IU/L — ABNORMAL HIGH (ref 44–121)
BUN/Creatinine Ratio: 37 — ABNORMAL HIGH (ref 10–24)
BUN: 46 mg/dL — ABNORMAL HIGH (ref 8–27)
Bilirubin Total: <0.2 mg/dL (ref 0.0–1.2)
CO2: 26 mmol/L (ref 20–29)
Calcium: 10.1 mg/dL (ref 8.6–10.2)
Chloride: 90 mmol/L — ABNORMAL LOW (ref 96–106)
Creatinine, Ser: 1.24 mg/dL (ref 0.76–1.27)
Globulin, Total: 3.1 g/dL (ref 1.5–4.5)
Glucose: 212 mg/dL — ABNORMAL HIGH (ref 70–99)
Potassium: 4.1 mmol/L (ref 3.5–5.2)
Sodium: 134 mmol/L (ref 134–144)
Total Protein: 7.2 g/dL (ref 6.0–8.5)
eGFR: 61 mL/min/{1.73_m2} (ref >59)

## 2021-11-26 LAB — TSH: TSH: 1.96 u[IU]/mL (ref 0.450–4.500)

## 2021-11-27 ENCOUNTER — Encounter: Payer: Self-pay | Admitting: Internal Medicine

## 2021-11-27 ENCOUNTER — Telehealth: Payer: Self-pay | Admitting: Family

## 2021-11-27 ENCOUNTER — Ambulatory Visit (INDEPENDENT_AMBULATORY_CARE_PROVIDER_SITE_OTHER): Payer: Medicare Other | Admitting: Internal Medicine

## 2021-11-27 VITALS — BP 124/70 | HR 85 | Ht 66.0 in | Wt 147.0 lb

## 2021-11-27 DIAGNOSIS — D500 Iron deficiency anemia secondary to blood loss (chronic): Secondary | ICD-10-CM | POA: Diagnosis not present

## 2021-11-27 DIAGNOSIS — K5520 Angiodysplasia of colon without hemorrhage: Secondary | ICD-10-CM

## 2021-11-27 DIAGNOSIS — I4892 Unspecified atrial flutter: Secondary | ICD-10-CM | POA: Diagnosis not present

## 2021-11-27 NOTE — Patient Instructions (Signed)
Please call and make appointment with your PCP.  Contact your drugstore or pulmonary Doctor about refilling your medicine.   Call and get an appointment with Dr Davina Poke to discuss treatment for the a flutter.  I appreciate the opportunity to care for you. Silvano Rusk, MD, [facility]

## 2021-11-27 NOTE — Progress Notes (Addendum)
John [name] 76 y.o. 1945/10/25 [id number]  Assessment & Plan:   Encounter Diagnoses  Name Primary?   Angiodysplasia of gastrointestinal tract Yes   Iron deficiency anemia due to chronic blood loss    Atrial flutter, unspecified type ([place])     He is improved with respect to iron deficiency anemia/GI bleeding from AVMs.  It is certainly likely that there are other AVMs in his small bowel.  Interesting but not impossible that 1 month after upper endoscopy without AVMs there were gastroduodenal AVMs noted.  He is currently off anticoagulation and he is not inclined to restart.  He did not seem to have a full grasp of what the potential more permanent treatment of his A-flutter was.  I am not opposed to temporarily resuming anticoagulation if he understands the risks.  It is hard for me to give a good prediction of how likely recurrent GI bleeding would be but I suspect that at some point he would have bleeding depending upon how long he was on anticoagulation.  Certainly a shorter period is better than a longer period I understand the options are 1 month versus 2 months for ablation versus watchman. That said it might be worth the risk if it is in his best interest to ablate the A-flutter.  He will think about it and needs to discuss with cardiology.  I told him I biases usually towards stroke prevention and that while we would like no bleeding, that is usually treatable with support plus or minus GI interventions.  Again his inclination is to not take anticoagulation, in part because he has been hospitalized multiple times this year and is fearful of returning.  I would certainly optimize iron status and consider parenteral iron therapy to make sure her ferritin is in a normal range before restarting any anticoagulation   He will schedule a follow-up with Dr. Davina Poke of cardiology.  He may see GI as needed, Dr. Henrene Pastor is his primary gastroenterologist.  CC: [name], FNP Dr. Scarlette Shorts Dr. Fransico Michael Subjective:   Chief Complaint: GI bleeding follow-up  HPI 76 year old white man with multiple health problems including GI angiodysplasia with bleeding, a flutter, CHF, COPD plus as needed oxygen, peripheral vascular disease with femoral endarterectomy and stenting of the common iliac, diabetes mellitus and radiation therapy for lung cancer (2018) who presents after being hospitalized in [facility] and having ablation of gastric and small bowel AVMs.  He is known to [place] having seen Dr. Henrene Pastor in the past.  EGD in 09/2020 with a small gastric ulcer and what was thought to be a multifactorial blood loss due to perioperative losses and a hematoma.  Dr. Henrene Pastor  Colonoscopy 06/20/2021 after positive Cologuard, cecal AVM and diverticulosis Dr. Henrene Pastor  June 2023 hospitalized 09/16/2021 EGD with vascular changes in the esophagus thought secondary to radiation and erythema of the stomach showing PPI effect changes.  Colonoscopy same day and the cecal AVM was ablated.  Diverticulosis also.  Dr. Carlean Purl  July 2023 hospitalized at [facility], worsening anemia on Eliquis.  Push enteroscopy with 6 small AVMs in the stomach (body), 4 found in the duodenum and 2 in the proximal jejunum all ablated with APC by Dr. Jenetta Downer.  He is anticoagulation was not restarted.  His hemoglobin has improved.    Latest Ref Rng & Units 10/14/2021   12:29 PM 10/12/2021    4:42 AM 10/11/2021    4:41 AM  CBC  WBC 3.4 -  10.8 x10E3/uL 6.7  10.2  8.8   Hemoglobin 13.0 - 17.7 g/dL 9.1  8.8  8.4   Hematocrit 37.5 - 51.0 % 27.6  28.9  28.6   Platelets 150 - 450 x10E3/uL 326  345  321    There is an anemia profile pending from 2 days ago ordered at primary care.  He remains on iron supplementation.  He saw Dr. Audie Box of cardiology on October 11, 2021 and the note says he has had discussions about a flutter ablation but would require uninterrupted anticoagulation.  Per that note Dr. Henrene Pastor was going to  be contacted.  The patient had seen Dr. Henrene Pastor originally but was on my schedule today.  I was the last [place] [facility] physician to see him.  I do not know what the results of that possible staff message communication were.  The patient is not inclined to go back on anticoagulation.  He is feeling stronger.  He does not want to be hospitalized again if he can help it.   Allergies  Allergen Reactions   Sulfa Antibiotics Swelling    Mouth swelling   Atorvastatin Other (See Comments)    Muscle aches - tolerating Pravastatin 40 mg MWF   Jardiance [Empagliflozin] Other (See Comments)    FEELS SLUGGISH, TIRED   Lopressor [Metoprolol] Other (See Comments)    Fatigue   Rosuvastatin Other (See Comments)    Muscle aches - tolerating Pravastatin 40 mg MWF   Doxycycline Nausea Only   Temazepam Other (See Comments)    Made insomnia worse    Current Meds  Medication Sig   Blood Glucose Monitoring Suppl (ONETOUCH VERIO REFLECT) w/Device KIT Use to test blood sugars daily as directed. DX: E11.9   Cyanocobalamin (B-12 PO) Take 1 tablet by mouth daily.   dutasteride (AVODART) 0.5 MG capsule Take 1 capsule (0.5 mg total) by mouth daily.   famotidine (PEPCID) 20 MG tablet One after supper (Patient taking differently: Take 20 mg by mouth daily.)   ferrous sulfate 325 (65 FE) MG tablet Take 325 mg by mouth daily at 6 (six) AM.   fluticasone (FLONASE) 50 MCG/ACT nasal spray PLACE 1 SPRAY INTO BOTH NOSTRILS DAILY AS NEEDED FOR ALLERGIES OR RHINITIS.   Fluticasone-Umeclidin-Vilant (TRELEGY ELLIPTA) 100-62.5-25 MCG/INH AEPB Inhale 1 puff into the lungs daily.   gabapentin (NEURONTIN) 300 MG capsule TAKE 1 CAPSULE BY MOUTH THREE TIMES A DAY (Patient taking differently: Take 300 mg by mouth 3 (three) times daily.)   KLOR-CON M20 20 MEQ tablet TAKE 1 TABLET BY MOUTH EVERY DAY   levothyroxine (SYNTHROID) 50 MCG tablet TAKE 1 TABLET BY MOUTH EVERY DAY (Patient taking differently: Take 50 mcg by mouth daily before  breakfast.)   linaclotide (LINZESS) 72 MCG capsule Take 1 capsule (72 mcg total) by mouth daily as needed (constipation).   Melatonin 10 MG TABS Take 10 mg by mouth at bedtime as needed (sleep).   metolazone (ZAROXOLYN) 2.5 MG tablet Take 1 tablet (2.5 mg total) by mouth 3 (three) times a week.   NON FORMULARY CPAP at bedtime   ondansetron (ZOFRAN) 4 MG tablet Take 1 tablet (4 mg total) by mouth every 8 (eight) hours as needed for nausea or vomiting.   OneTouch Delica Lancets 35D MISC Use to test blood sugars daily as directed. DX: E11.9   ONETOUCH VERIO test strip USE TO TEST BLOOD SUGARS DAILY AS DIRECTED. DX: E11.9   OXYGEN Inhale 2 L into the lungs daily as needed.   pantoprazole (  PROTONIX) 40 MG tablet Take 1 tablet (40 mg total) by mouth 2 (two) times daily before a meal.   pravastatin (PRAVACHOL) 40 MG tablet TAKE 1 TABLET (40 MG TOTAL) BY MOUTH ON MONDAYS , WEDNESDAYS, AND FRIDAYS AS DIRECTED (Patient taking differently: Take 40 mg by mouth 3 (three) times a week. Mon, wed, and fri)   Semaglutide (RYBELSUS) 7 MG TABS Take 7 mg by mouth daily.   spironolactone (ALDACTONE) 25 MG tablet Take 1 tablet (25 mg total) by mouth daily.   torsemide (DEMADEX) 20 MG tablet TAKE 2 TABLETS (40 MG) IN THE MORNING, TAKE 2 TABLETS (40 MG) IN THE EVENING DAILY AS NEEDED.   zolpidem (AMBIEN) 5 MG tablet Take 1 tablet (5 mg total) by mouth at bedtime as needed for sleep.   Past Medical History:  Diagnosis Date   Adenocarcinoma of lung, right ([place]) 04/18/2016   Anemia    Anxiety    Arthritis    Asthma    Atrial flutter (HCC)    BPH (benign prostatic hyperplasia)    with urinary retention 02/06/20   CHF (congestive heart failure) (HCC)    COPD (chronic obstructive pulmonary disease) ([place])    Depression    Diabetes mellitus, type II ([place])    Dyspnea    History of kidney stones    History of radiation therapy    right lung 09/10/2020-09/17/2020   Dr Sondra Come   Hyperlipidemia    Hypertension     Hypothyroidism    Macular degeneration    Neuropathy    Non-small cell lung cancer, right ([place]) 04/18/2016   Peripheral vascular disease ([place])    Prostatitis    Pulmonary nodule, left 07/16/2016   Sleep apnea    cpap   Past Surgical History:  Procedure Laterality Date   ABDOMINAL AORTOGRAM W/LOWER EXTREMITY Left 02/06/2020   Procedure: ABDOMINAL AORTOGRAM W/LOWER EXTREMITY;  Surgeon: Lorretta Harp, MD;  Location: [place] [facility];  Service: Cardiovascular;  Laterality: Left;   ABDOMINAL AORTOGRAM W/LOWER EXTREMITY N/A 05/30/2021   Procedure: ABDOMINAL AORTOGRAM W/LOWER EXTREMITY;  Surgeon: Marty Heck, MD;  Location: [place] [facility];  Service: Cardiovascular;  Laterality: N/A;   BIOPSY  09/16/2021   Procedure: BIOPSY;  Surgeon: Gatha Mayer, MD;  Location: [place];  Service: Gastroenterology;;   CARDIOVERSION N/A 10/05/2020   Procedure: CARDIOVERSION;  Surgeon: Geralynn Rile, MD;  Location: [place];  Service: Cardiovascular;  Laterality: N/A;   CATARACT EXTRACTION, BILATERAL Bilateral    COLONOSCOPY WITH PROPOFOL N/A 06/20/2021   Procedure: COLONOSCOPY WITH PROPOFOL;  Surgeon: Irene Shipper, MD;  Location: [facility];  Service: Endoscopy;  Laterality: N/A;   COLONOSCOPY WITH PROPOFOL N/A 09/16/2021   Procedure: COLONOSCOPY WITH PROPOFOL;  Surgeon: Gatha Mayer, MD;  Location: [place];  Service: Gastroenterology;  Laterality: N/A;   ENDARTERECTOMY FEMORAL Right 08/20/2020   Procedure: ENDARTERECTOMY  RIGHT FEMORAL ARTERY;  Surgeon: Marty Heck, MD;  Location: [place];  Service: Vascular;  Laterality: Right;   ENTEROSCOPY N/A 10/11/2021   Procedure: ENTEROSCOPY;  Surgeon: Harvel Quale, MD;  [facility];  Service: Gastroenterology;  Laterality: N/A;   ESOPHAGOGASTRODUODENOSCOPY N/A 09/16/2021   Procedure: ESOPHAGOGASTRODUODENOSCOPY (EGD);  Surgeon: Gatha Mayer, MD;  Location: [place];  Service: Gastroenterology;   Laterality: N/A;   ESOPHAGOGASTRODUODENOSCOPY (EGD) WITH PROPOFOL N/A 09/06/2020   Procedure: ESOPHAGOGASTRODUODENOSCOPY (EGD) WITH PROPOFOL;  Surgeon: Irene Shipper, MD;  Location: [facility];  Service: Endoscopy;  Laterality: N/A;  ESOPHAGOGASTRODUODENOSCOPY (EGD) WITH PROPOFOL N/A 10/11/2021   Procedure: ESOPHAGOGASTRODUODENOSCOPY (EGD) WITH PROPOFOL;  Surgeon: Harvel Quale, MD;  [facility];  Service: Gastroenterology;  Laterality: N/A;   HEMOSTASIS CLIP PLACEMENT  09/16/2021   Procedure: HEMOSTASIS CLIP PLACEMENT;  Surgeon: Gatha Mayer, MD;  Location: [place];  Service: Gastroenterology;;   HOT HEMOSTASIS N/A 09/16/2021   Procedure: HOT HEMOSTASIS (ARGON PLASMA COAGULATION/BICAP);  Surgeon: Gatha Mayer, MD;  Location: [place];  Service: Gastroenterology;  Laterality: N/A;   HOT HEMOSTASIS  10/11/2021   Procedure: HOT HEMOSTASIS (ARGON PLASMA COAGULATION/BICAP);  Surgeon: Montez Morita, Quillian Quince, MD;  [facility];  Service: Gastroenterology;;   INSERTION OF ILIAC STENT Right 08/20/2020   Procedure: RETROGRADE INSERTION OF RIGHT ILIAC STENT;  Surgeon: Marty Heck, MD;  Location: [place];  Service: Vascular;  Laterality: Right;   INTRAOPERATIVE ARTERIOGRAM Right 08/20/2020   Procedure: INTRA OPERATIVE ARTERIOGRAM ILIAC;  Surgeon: Marty Heck, MD;  Location: [place];  Service: Vascular;  Laterality: Right;   PATCH ANGIOPLASTY Right 08/20/2020   Procedure: PATCH ANGIOPLASTY RIGHT FEMORAL ARTERY;  Surgeon: Marty Heck, MD;  Location: [place];  Service: Vascular;  Laterality: Right;   PERIPHERAL VASCULAR BALLOON ANGIOPLASTY Right 05/30/2021   Procedure: PERIPHERAL VASCULAR BALLOON ANGIOPLASTY;  Surgeon: Marty Heck, MD;  Location: [place] [facility];  Service: Cardiovascular;  Laterality: Right;   PORTACATH PLACEMENT Left 06/13/2016   Procedure: INSERTION PORT-A-CATH;  Surgeon: Aviva Signs, MD;  [facility];   Service: General;  Laterality: Left;   TRANSURETHRAL RESECTION OF PROSTATE N/A 05/31/2020   Procedure: TRANSURETHRAL RESECTION OF THE PROSTATE (TURP);  Surgeon: Cleon Gustin, MD;  [facility];  Service: Urology;  Laterality: N/A;   VIDEO BRONCHOSCOPY WITH ENDOBRONCHIAL NAVIGATION N/A 05/28/2016   Procedure: VIDEO BRONCHOSCOPY WITH ENDOBRONCHIAL NAVIGATION;  Surgeon: [name], MD;  Location: [place];  Service: Thoracic;  Laterality: N/A;   VIDEO BRONCHOSCOPY WITH ENDOBRONCHIAL ULTRASOUND N/A 05/28/2016   Procedure: VIDEO BRONCHOSCOPY WITH ENDOBRONCHIAL ULTRASOUND;  Surgeon: [name], MD;  Location: [place];  Service: Thoracic;  Laterality: N/A;   Social History   Social History Narrative   [name] is his POA and lives with him. Children out of town - one in [facility], others in [place].   family history includes Diabetes in his father; Heart disease in his father; Hypertension in his mother and sister; Stroke in his father.   Review of Systems As per HPI  Objective:   Physical Exam BP 124/70   Pulse 85   Ht _0  (1.676 m)   Wt 147 lb (66.7 kg)   BMI 23.73 kg/m    32 minutes total time on this visit before during and after time with patient.

## 2021-11-28 ENCOUNTER — Other Ambulatory Visit: Payer: Self-pay | Admitting: Family

## 2021-11-28 DIAGNOSIS — I5033 Acute on chronic diastolic (congestive) heart failure: Secondary | ICD-10-CM | POA: Diagnosis not present

## 2021-11-28 DIAGNOSIS — K922 Gastrointestinal hemorrhage, unspecified: Secondary | ICD-10-CM | POA: Diagnosis not present

## 2021-11-28 DIAGNOSIS — E1142 Type 2 diabetes mellitus with diabetic polyneuropathy: Secondary | ICD-10-CM

## 2021-11-28 DIAGNOSIS — E1159 Type 2 diabetes mellitus with other circulatory complications: Secondary | ICD-10-CM | POA: Diagnosis not present

## 2021-11-28 DIAGNOSIS — D62 Acute posthemorrhagic anemia: Secondary | ICD-10-CM | POA: Diagnosis not present

## 2021-11-28 DIAGNOSIS — I152 Hypertension secondary to endocrine disorders: Secondary | ICD-10-CM | POA: Diagnosis not present

## 2021-11-28 DIAGNOSIS — J449 Chronic obstructive pulmonary disease, unspecified: Secondary | ICD-10-CM | POA: Diagnosis not present

## 2021-11-28 MED ORDER — TRELEGY ELLIPTA 100-62.5-25 MCG/ACT IN AEPB: 1.0000 | INHALATION_SPRAY | Freq: Every day | RESPIRATORY_TRACT | 11 refills | Status: DC

## 2021-11-28 NOTE — Telephone Encounter (Signed)
CALLED PATIENT HE IS AWARE OF LABS. iT WILL NOT LET ME SEND IN INHALER SAID IT NEEDS A NEW ORDER PLEASE ADVISE

## 2021-11-28 NOTE — Telephone Encounter (Signed)
Prescription sent to pharmacy.

## 2021-12-02 DIAGNOSIS — J449 Chronic obstructive pulmonary disease, unspecified: Secondary | ICD-10-CM | POA: Diagnosis not present

## 2021-12-02 DIAGNOSIS — D62 Acute posthemorrhagic anemia: Secondary | ICD-10-CM | POA: Diagnosis not present

## 2021-12-02 DIAGNOSIS — E1159 Type 2 diabetes mellitus with other circulatory complications: Secondary | ICD-10-CM | POA: Diagnosis not present

## 2021-12-02 DIAGNOSIS — I5033 Acute on chronic diastolic (congestive) heart failure: Secondary | ICD-10-CM | POA: Diagnosis not present

## 2021-12-02 DIAGNOSIS — I152 Hypertension secondary to endocrine disorders: Secondary | ICD-10-CM | POA: Diagnosis not present

## 2021-12-02 DIAGNOSIS — K922 Gastrointestinal hemorrhage, unspecified: Secondary | ICD-10-CM | POA: Diagnosis not present

## 2021-12-06 DIAGNOSIS — D62 Acute posthemorrhagic anemia: Secondary | ICD-10-CM | POA: Diagnosis not present

## 2021-12-06 DIAGNOSIS — I152 Hypertension secondary to endocrine disorders: Secondary | ICD-10-CM | POA: Diagnosis not present

## 2021-12-06 DIAGNOSIS — I5033 Acute on chronic diastolic (congestive) heart failure: Secondary | ICD-10-CM | POA: Diagnosis not present

## 2021-12-06 DIAGNOSIS — K922 Gastrointestinal hemorrhage, unspecified: Secondary | ICD-10-CM | POA: Diagnosis not present

## 2021-12-06 DIAGNOSIS — E1159 Type 2 diabetes mellitus with other circulatory complications: Secondary | ICD-10-CM | POA: Diagnosis not present

## 2021-12-06 DIAGNOSIS — J449 Chronic obstructive pulmonary disease, unspecified: Secondary | ICD-10-CM | POA: Diagnosis not present

## 2021-12-09 NOTE — Progress Notes (Signed)
Cardiology Office Note:   Date:  12/12/2021  NAME:  John [name]    MRN: [id number] DOB:  1945-07-06   PCP:  [name], FNP  Cardiologist:  Evalina Field, MD  Electrophysiologist:  None   Referring MD: [name], FNP   Chief Complaint  Patient presents with   Follow-up    2 months.    History of Present Illness:   John [name] is a 76 y.o. male with a hx of Aflutter, GI bleed, COPD, PAD, HFpEF who presents for follow-up.  Seen by GI.  They believe a trial of anticoagulation is okay.  Hemoglobin is stable.  Remains in atrial flutter.  No symptoms from this.  Weights are stable.  Euvolemic on exam.  We discussed starting Eliquis and likely having him evaluated for an atrial flutter ablation.  Discussed his case with Lars Mage.  He believes this is the best option.  I think he also would be a good candidate for Watchman procedure.  Discussed this with him.  We will have Dr. Quentin Ore discuss this with him as well.  Problem List 1. Coronary calcifications 2. Atrial flutter -Dx 08/21/2020 -CHADSVASC=5 (age, DM, PAD, HTN, CHF) -DCCV 10/05/2020 -return 10/2021 3. PAD -R femoral endarterectomy 08/20/2020 -R common iliac artery stent 08/20/2020 4. Non-small cell lung CA -2018 s/p chemo/radiation  -recurrent nodules without recurrence  5. COPD 6. Tobacco abuse 7. Gastric ulcer/Recurrent anemia -09/2021: radiation gastritis  -09/2021: colonic AVM -10/2021: gastric AVM/duodenal AVM 8. DM -A1c 4.7 9. HLD -T chol 126, HDL 28, LDL 80, TG 89 10. HFpEF 11. OSA  Past Medical History: Past Medical History:  Diagnosis Date   Adenocarcinoma of lung, right ([place]) 04/18/2016   Anemia    Anxiety    Arthritis    Asthma    Atrial flutter (HCC)    BPH (benign prostatic hyperplasia)    with urinary retention 02/06/20   CHF (congestive heart failure) (HCC)    COPD (chronic obstructive pulmonary disease) (HCC)    Depression    Diabetes mellitus, type II ([place])    Dyspnea     History of kidney stones    History of radiation therapy    right lung 09/10/2020-09/17/2020   Dr Sondra Come   Hyperlipidemia    Hypertension    Hypothyroidism    Macular degeneration    Neuropathy    Non-small cell lung cancer, right ([place]) 04/18/2016   Peripheral vascular disease ([place])    Prostatitis    Pulmonary nodule, left 07/16/2016   Sleep apnea    cpap    Past Surgical History: Past Surgical History:  Procedure Laterality Date   ABDOMINAL AORTOGRAM W/LOWER EXTREMITY Left 02/06/2020   Procedure: ABDOMINAL AORTOGRAM W/LOWER EXTREMITY;  Surgeon: Lorretta Harp, MD;  Location: [facility];  Service: Cardiovascular;  Laterality: Left;   ABDOMINAL AORTOGRAM W/LOWER EXTREMITY N/A 05/30/2021   Procedure: ABDOMINAL AORTOGRAM W/LOWER EXTREMITY;  Surgeon: Marty Heck, MD;  Location: [facility];  Service: Cardiovascular;  Laterality: N/A;   BIOPSY  09/16/2021   Procedure: BIOPSY;  Surgeon: Gatha Mayer, MD;  Location: [facility];  Service: Gastroenterology;;   CARDIOVERSION N/A 10/05/2020   Procedure: CARDIOVERSION;  Surgeon: Geralynn Rile, MD;  Location: [facility];  Service: Cardiovascular;  Laterality: N/A;   CATARACT EXTRACTION, BILATERAL Bilateral    COLONOSCOPY WITH PROPOFOL N/A 06/20/2021   Procedure: COLONOSCOPY WITH PROPOFOL;  Surgeon: Irene Shipper, MD;  Location: [facility];  Service:  Endoscopy;  Laterality: N/A;   COLONOSCOPY WITH PROPOFOL N/A 09/16/2021   Procedure: COLONOSCOPY WITH PROPOFOL;  Surgeon: Gatha Mayer, MD;  Location: [facility];  Service: Gastroenterology;  Laterality: N/A;   ENDARTERECTOMY FEMORAL Right 08/20/2020   Procedure: ENDARTERECTOMY  RIGHT FEMORAL ARTERY;  Surgeon: Marty Heck, MD;  Location: [facility];  Service: Vascular;  Laterality: Right;   ENTEROSCOPY N/A 10/11/2021   Procedure: ENTEROSCOPY;  Surgeon: Harvel Quale, MD;  [facility];  Service: Gastroenterology;  Laterality: N/A;    ESOPHAGOGASTRODUODENOSCOPY N/A 09/16/2021   Procedure: ESOPHAGOGASTRODUODENOSCOPY (EGD);  Surgeon: Gatha Mayer, MD;  Location: [facility];  Service: Gastroenterology;  Laterality: N/A;   ESOPHAGOGASTRODUODENOSCOPY (EGD) WITH PROPOFOL N/A 09/06/2020   Procedure: ESOPHAGOGASTRODUODENOSCOPY (EGD) WITH PROPOFOL;  Surgeon: Irene Shipper, MD;  Location: [facility];  Service: Endoscopy;  Laterality: N/A;   ESOPHAGOGASTRODUODENOSCOPY (EGD) WITH PROPOFOL N/A 10/11/2021   Procedure: ESOPHAGOGASTRODUODENOSCOPY (EGD) WITH PROPOFOL;  Surgeon: Harvel Quale, MD;  [facility];  Service: Gastroenterology;  Laterality: N/A;   HEMOSTASIS CLIP PLACEMENT  09/16/2021   Procedure: HEMOSTASIS CLIP PLACEMENT;  Surgeon: Gatha Mayer, MD;  Location: [facility];  Service: Gastroenterology;;   HOT HEMOSTASIS N/A 09/16/2021   Procedure: HOT HEMOSTASIS (ARGON PLASMA COAGULATION/BICAP);  Surgeon: Gatha Mayer, MD;  Location: [facility];  Service: Gastroenterology;  Laterality: N/A;   HOT HEMOSTASIS  10/11/2021   Procedure: HOT HEMOSTASIS (ARGON PLASMA COAGULATION/BICAP);  Surgeon: Montez Morita, Quillian Quince, MD;  [facility];  Service: Gastroenterology;;   INSERTION OF ILIAC STENT Right 08/20/2020   Procedure: RETROGRADE INSERTION OF RIGHT ILIAC STENT;  Surgeon: Marty Heck, MD;  Location: [facility];  Service: Vascular;  Laterality: Right;   INTRAOPERATIVE ARTERIOGRAM Right 08/20/2020   Procedure: INTRA OPERATIVE ARTERIOGRAM ILIAC;  Surgeon: Marty Heck, MD;  Location: [facility];  Service: Vascular;  Laterality: Right;   PATCH ANGIOPLASTY Right 08/20/2020   Procedure: PATCH ANGIOPLASTY RIGHT FEMORAL ARTERY;  Surgeon: Marty Heck, MD;  Location: [facility];  Service: Vascular;  Laterality: Right;   PERIPHERAL VASCULAR BALLOON ANGIOPLASTY Right 05/30/2021   Procedure: PERIPHERAL VASCULAR BALLOON ANGIOPLASTY;  Surgeon: Marty Heck, MD;  Location: [facility];   Service: Cardiovascular;  Laterality: Right;   PORTACATH PLACEMENT Left 06/13/2016   Procedure: INSERTION PORT-A-CATH;  Surgeon: Aviva Signs, MD;  [facility];  Service: General;  Laterality: Left;   TRANSURETHRAL RESECTION OF PROSTATE N/A 05/31/2020   Procedure: TRANSURETHRAL RESECTION OF THE PROSTATE (TURP);  Surgeon: Cleon Gustin, MD;  [facility];  Service: Urology;  Laterality: N/A;   VIDEO BRONCHOSCOPY WITH ENDOBRONCHIAL NAVIGATION N/A 05/28/2016   Procedure: VIDEO BRONCHOSCOPY WITH ENDOBRONCHIAL NAVIGATION;  Surgeon: [name], MD;  Location: [facility];  Service: Thoracic;  Laterality: N/A;   VIDEO BRONCHOSCOPY WITH ENDOBRONCHIAL ULTRASOUND N/A 05/28/2016   Procedure: VIDEO BRONCHOSCOPY WITH ENDOBRONCHIAL ULTRASOUND;  Surgeon: [name], MD;  [facility];  Service: Thoracic;  Laterality: N/A;    Current Medications: Current Meds  Medication Sig   apixaban (ELIQUIS) 5 MG TABS tablet Take 1 tablet (5 mg total) by mouth 2 (two) times daily.   Blood Glucose Monitoring Suppl (ONETOUCH VERIO REFLECT) w/Device KIT Use to test blood sugars daily as directed. DX: E11.9   [employer] IRON 325 (65 Fe) MG tablet TAKE 1 TABLET (325MG) TWICE DAILY   Cyanocobalamin (B-12 PO) Take 1 tablet by mouth daily.   dutasteride (AVODART) 0.5 MG capsule Take 1  capsule (0.5 mg total) by mouth daily.   famotidine (PEPCID) 20 MG tablet One after supper (Patient taking differently: Take 20 mg by mouth daily.)   fluticasone (FLONASE) 50 MCG/ACT nasal spray PLACE 1 SPRAY INTO BOTH NOSTRILS DAILY AS NEEDED FOR ALLERGIES OR RHINITIS.   Fluticasone-Umeclidin-Vilant (TRELEGY ELLIPTA) 100-62.5-25 MCG/ACT AEPB Inhale 1 puff into the lungs daily.   gabapentin (NEURONTIN) 300 MG capsule TAKE 1 CAPSULE BY MOUTH THREE TIMES A DAY   KLOR-CON M20 20 MEQ tablet TAKE 1 TABLET BY MOUTH EVERY DAY   levothyroxine (SYNTHROID) 50 MCG tablet TAKE 1 TABLET BY MOUTH EVERY DAY (Patient taking differently: Take 50  mcg by mouth daily before breakfast.)   linaclotide (LINZESS) 72 MCG capsule Take 1 capsule (72 mcg total) by mouth daily as needed (constipation).   Melatonin 10 MG TABS Take 10 mg by mouth at bedtime as needed (sleep).   metolazone (ZAROXOLYN) 2.5 MG tablet Take 1 tablet (2.5 mg total) by mouth 3 (three) times a week.   NON FORMULARY CPAP at bedtime   ondansetron (ZOFRAN) 4 MG tablet Take 1 tablet (4 mg total) by mouth every 8 (eight) hours as needed for nausea or vomiting.   OneTouch Delica Lancets 77O MISC Use to test blood sugars daily as directed. DX: E11.9   ONETOUCH VERIO test strip USE TO TEST BLOOD SUGARS DAILY AS DIRECTED. DX: E11.9   OXYGEN Inhale 2 L into the lungs daily as needed.   pantoprazole (PROTONIX) 40 MG tablet Take 1 tablet (40 mg total) by mouth 2 (two) times daily before a meal.   pravastatin (PRAVACHOL) 40 MG tablet TAKE 1 TABLET (40 MG TOTAL) BY MOUTH ON MONDAYS , WEDNESDAYS, AND FRIDAYS AS DIRECTED (Patient taking differently: Take 40 mg by mouth 3 (three) times a week. Mon, wed, and fri)   Semaglutide (RYBELSUS) 7 MG TABS Take 7 mg by mouth daily.   spironolactone (ALDACTONE) 25 MG tablet Take 1 tablet (25 mg total) by mouth daily.   zolpidem (AMBIEN) 5 MG tablet Take 1 tablet (5 mg total) by mouth at bedtime as needed for sleep.   [DISCONTINUED] torsemide (DEMADEX) 20 MG tablet TAKE 2 TABLETS (40 MG) IN THE MORNING, TAKE 2 TABLETS (40 MG) IN THE EVENING DAILY AS NEEDED.     Allergies:    Sulfa antibiotics, Atorvastatin, Jardiance [empagliflozin], Lopressor [metoprolol], Rosuvastatin, Doxycycline, and Temazepam   Social History: Social History   Socioeconomic History   Marital status: Soil scientist    Spouse name: Not on file   Number of children: 5   Years of education: 10   Highest education level: 10th grade  Occupational History   Occupation: Retired  Tobacco Use   Smoking status: Every Day    Packs/day: 0.50    Years: 55.00    Total pack years:  27.50    Types: Cigarettes    Start date: 03/11/1961   Smokeless tobacco: Never   Tobacco comments:    1/2 pack to 1 pack per day 12/14/19  Vaping Use   Vaping Use: Never used  Substance and Sexual Activity   Alcohol use: No   Drug use: No   Sexual activity: Yes    Birth control/protection: None  Other Topics Concern   Not on file  Social History Narrative   [name] is his POA and lives with him. Children out of town - one in [facility], others in [place].   Social Determinants of Health   Financial Resource Strain: Low Risk  (  11/15/2021)   Overall Financial Resource Strain (CARDIA)    Difficulty of Paying Living Expenses: Not hard at all  Food Insecurity: No Food Insecurity (11/15/2021)   Hunger Vital Sign    Worried About Running Out of Food in the Last Year: Never true    Ran Out of Food in the Last Year: Never true  Transportation Needs: No Transportation Needs (11/15/2021)   PRAPARE - Hydrologist (Medical): No    Lack of Transportation (Non-Medical): No  Physical Activity: Inactive (11/15/2021)   Exercise Vital Sign    Days of Exercise per Week: 0 days    Minutes of Exercise per Session: 0 min  Stress: No Stress Concern Present (11/15/2021)   [place]    Feeling of Stress : Only a little  Social Connections: Moderately Isolated (11/15/2021)   Social Connection and Isolation Panel [NHANES]    Frequency of Communication with Friends and Family: More than three times a week    Frequency of Social Gatherings with Friends and Family: Once a week    Attends Religious Services: Never    Marine scientist or Organizations: No    Attends Music therapist: Never    Marital Status: Living with partner     Family History: The patient's family history includes Diabetes in his father; Heart disease in his father; Hypertension in his mother and sister; Stroke in his  father.  ROS:   All other ROS reviewed and negative. Pertinent positives noted in the HPI.     EKGs/Labs/Other Studies Reviewed:   The following studies were personally reviewed by me today:  An EKG was ordered in office today.  This was personally reviewed by me.  EKG demonstrated atrial flutter heart rate 90.  TTE 09/15/2021  1. Aortic valve calcified with restricted motion but no AS by doppler.   2. Left ventricular ejection fraction, by estimation, is 55 to 60%. The  left ventricle has normal function. The left ventricle has no regional  wall motion abnormalities. Left ventricular diastolic parameters are  indeterminate.   3. Right ventricular systolic function is normal. The right ventricular  size is normal. There is mildly elevated pulmonary artery systolic  pressure.   4. Left atrial size was mildly dilated.   5. The mitral valve is normal in structure. No evidence of mitral valve  regurgitation. No evidence of mitral stenosis. Moderate mitral annular  calcification.   6. The aortic valve is calcified. Aortic valve regurgitation is trivial.  Aortic valve sclerosis/calcification is present, without any evidence of  aortic stenosis.   7. The inferior vena cava is dilated in size with <50% respiratory  variability, suggesting right atrial pressure of 15 mmHg.   Recent Labs: 09/16/2021: Magnesium 2.2 10/10/2021: B Natriuretic Peptide 191.0 10/14/2021: Hemoglobin 9.1; Platelets 326 11/25/2021: ALT 20; BUN 46; Creatinine, Ser 1.24; Potassium 4.1; Sodium 134; TSH 1.960   Recent Lipid Panel    Component Value Date/Time   CHOL 126 08/21/2020 0530   CHOL 136 04/25/2019 1608   TRIG 89 08/21/2020 0530   HDL 28 (L) 08/21/2020 0530   HDL 28 (L) 04/25/2019 1608   CHOLHDL 4.5 08/21/2020 0530   VLDL 18 08/21/2020 0530   LDLCALC 80 08/21/2020 0530   LDLCALC 78 04/25/2019 1608    Physical Exam:   VS:  BP 118/64 (BP Location: Left Arm, Patient Position: Sitting, Cuff Size: Normal)    Pulse 89  Ht 5' 6"  (1.676 m)   Wt 148 lb (67.1 kg)   BMI 23.89 kg/m    Wt Readings from Last 3 Encounters:  12/12/21 148 lb (67.1 kg)  12/10/21 144 lb (65.3 kg)  11/27/21 147 lb (66.7 kg)    General: Well nourished, well developed, in no acute distress Head: Atraumatic, normal size  Eyes: PEERLA, EOMI  Neck: Supple, no JVD Endocrine: No thryomegaly Cardiac: Normal S1, S2; RRR; no murmurs, rubs, or gallops Lungs: Clear to auscultation bilaterally, no wheezing, rhonchi or rales  Abd: Soft, nontender, no hepatomegaly  Ext: No edema, pulses 2+ Musculoskeletal: No deformities, BUE and BLE strength normal and equal Skin: Warm and dry, no rashes   Neuro: Alert and oriented to person, place, time, and situation, CNII-XII grossly intact, no focal deficits  Psych: Normal mood and affect   ASSESSMENT:   John [name] is a 76 y.o. male who presents for the following: 1. Chronic diastolic CHF (congestive heart failure) ([place])   2. Atrial flutter, unspecified type ([place])   3. Chronic anemia   4. PAD (peripheral artery disease) ([place])     PLAN:   1. Chronic diastolic CHF (congestive heart failure) (HCC) -Euvolemic on exam.  Adhering to a low-salt diet.  Patient is continue on torsemide 40 mg daily.  2. Atrial flutter, unspecified type ([place]) 3. Chronic anemia -History of recurrent GI bleed.  Seems to be due to colonic and gastric AVMs.  Evaluated by GI.  We will proceed with getting him back on Eliquis.  I discussed his case with Dr. Lars Mage.  We will get him in to be seen next week for likely flutter ablation.  He has only had atrial flutter seen on monitors.  I think this will treat his arrhythmia definitively.  We also discussed Watchman procedure.  He is interested.  This would allow for him to come off long-term anticoagulation.  For now we will go back on anticoagulation hopefully he will avoid any GI bleeding.  We will see Dr. Quentin Ore in the next 1 to 2 weeks to discuss flutter  ablation.  He will see me back in 3 months.  4. PAD (peripheral artery disease) ([place]) -History of PAD with peripheral stenting.  Going back on Eliquis.  Would like to avoid aspirin for now.  We will likely put him back on aspirin once we get him off Eliquis.  Disposition: Return in about 3 months (around 03/13/2022).  Medication Adjustments/Labs and Tests Ordered: Current medicines are reviewed at length with the patient today.  Concerns regarding medicines are outlined above.  Orders Placed This Encounter  Procedures   Ambulatory referral to Cardiac Electrophysiology   EKG 12-Lead   Meds ordered this encounter  Medications   apixaban (ELIQUIS) 5 MG TABS tablet    Sig: Take 1 tablet (5 mg total) by mouth 2 (two) times daily.    Dispense:  60 tablet    Refill:  2   torsemide (DEMADEX) 20 MG tablet    Sig: Take 2 tablets (40 mg) in the AM.    Dispense:  180 tablet    Refill:  2    Patient Instructions  Medication Instructions:  START BACK on Eliquis 5 mg twice daily  DECREASE Torsemide to 40 in the AM.   *If you need a refill on your cardiac medications before your next appointment, please call your pharmacy*  Follow-Up: At [facility], you and your health needs are our priority.  As part of  our continuing mission to provide you with exceptional heart care, we have created designated Provider Care Teams.  These Care Teams include your primary Cardiologist (physician) and Advanced Practice Providers (APPs -  Physician Assistants and Nurse Practitioners) who all work together to provide you with the care you need, when you need it.  We recommend signing up for the patient portal called "MyChart".  Sign up information is provided on this After Visit Summary.  MyChart is used to connect with patients for Virtual Visits (Telemedicine).  Patients are able to view lab/test results, encounter notes, upcoming appointments, etc.  Non-urgent messages can be sent to your provider as  well.   To learn more about what you can do with MyChart, go to [email or web address].    Your next appointment:   3 months  The format for your next appointment:   In Person  Provider:   Evalina Field, MD      Referral to EP- they will contact you for an appointment.     Time Spent with Patient: I have spent a total of 35 minutes with patient reviewing hospital notes, telemetry, EKGs, labs and examining the patient as well as establishing an assessment and plan that was discussed with the patient.  > 50% of time was spent in direct patient care.  Signed, Addison Naegeli. Audie Box, MD, [place]  [address], [place], [place] [address] [phone]  12/12/2021 5:21 PM

## 2021-12-10 ENCOUNTER — Ambulatory Visit (INDEPENDENT_AMBULATORY_CARE_PROVIDER_SITE_OTHER)
Admission: RE | Admit: 2021-12-10 | Discharge: 2021-12-10 | Disposition: A | Discharge status: Home / Self Care | Payer: Medicare Other | Source: Ambulatory Visit | Attending: Vascular Surgery | Admitting: Vascular Surgery

## 2021-12-10 ENCOUNTER — Ambulatory Visit (INDEPENDENT_AMBULATORY_CARE_PROVIDER_SITE_OTHER): Payer: Medicare Other | Admitting: Vascular Surgery

## 2021-12-10 ENCOUNTER — Ambulatory Visit (HOSPITAL_COMMUNITY)
Admission: RE | Admit: 2021-12-10 | Discharge: 2021-12-10 | Disposition: A | Discharge status: Home / Self Care | Payer: Medicare Other | Source: Ambulatory Visit | Attending: Vascular Surgery | Admitting: Vascular Surgery

## 2021-12-10 ENCOUNTER — Encounter: Payer: Self-pay | Admitting: Vascular Surgery

## 2021-12-10 ENCOUNTER — Encounter (HOSPITAL_COMMUNITY): Payer: Self-pay

## 2021-12-10 ENCOUNTER — Telehealth: Payer: Self-pay | Admitting: Family

## 2021-12-10 VITALS — BP 113/70 | HR 86 | Temp 97.5°F | Resp 14 | Ht 66.0 in | Wt 144.0 lb

## 2021-12-10 DIAGNOSIS — I739 Peripheral vascular disease, unspecified: Secondary | ICD-10-CM | POA: Diagnosis not present

## 2021-12-10 NOTE — Progress Notes (Signed)
Patient name: John [name] MRN: [id number] DOB: [birth year] Sex: male  REASON FOR VISIT: 3 month follow-up right leg PAD  HPI: John [name] is a 76 y.o. male with multiple medical comorbidities including hypertension, hyperlipidemia, diabetes, COPD, CHF that presents for 3 month follow-up of right leg PAD.  States his right leg is doing okay.  He did have a GI bleed and was taken off his aspirin and Eliquis.  No new concerns today.  Still smoking.  Initially underwent right common iliac stent with a right femoral endarterectomy on 08/20/2020 for short distance lifestyle limiting claudication.  He then developed recurrent symptoms in the right leg.  On 05/30/2021 he underwent right proximal common iliac artery angioplasty with drug-coated balloon, multiple right SFA and above-knee popliteal angioplasty with drug-coated balloon.    Past Medical History:  Diagnosis Date   Adenocarcinoma of lung, right ([place]) 04/18/2016   Anemia    Anxiety    Arthritis    Asthma    Atrial flutter (HCC)    BPH (benign prostatic hyperplasia)    with urinary retention 02/06/20   CHF (congestive heart failure) (HCC)    COPD (chronic obstructive pulmonary disease) ([place])    Depression    Diabetes mellitus, type II ([place])    Dyspnea    History of kidney stones    History of radiation therapy    right lung 09/10/2020-09/17/2020   Dr Sondra Come   Hyperlipidemia    Hypertension    Hypothyroidism    Macular degeneration    Neuropathy    Non-small cell lung cancer, right ([place]) 04/18/2016   Peripheral vascular disease ([place])    Prostatitis    Pulmonary nodule, left 07/16/2016   Sleep apnea    cpap    Past Surgical History:  Procedure Laterality Date   ABDOMINAL AORTOGRAM W/LOWER EXTREMITY Left 02/06/2020   Procedure: ABDOMINAL AORTOGRAM W/LOWER EXTREMITY;  Surgeon: Lorretta Harp, MD;  Location: [facility];  Service: Cardiovascular;  Laterality: Left;   ABDOMINAL AORTOGRAM W/LOWER EXTREMITY N/A  05/30/2021   Procedure: ABDOMINAL AORTOGRAM W/LOWER EXTREMITY;  Surgeon: Marty Heck, MD;  Location: [facility];  Service: Cardiovascular;  Laterality: N/A;   BIOPSY  09/16/2021   Procedure: BIOPSY;  Surgeon: Gatha Mayer, MD;  Location: [place];  Service: Gastroenterology;;   CARDIOVERSION N/A 10/05/2020   Procedure: CARDIOVERSION;  Surgeon: Geralynn Rile, MD;  Location: [place];  Service: Cardiovascular;  Laterality: N/A;   CATARACT EXTRACTION, BILATERAL Bilateral    COLONOSCOPY WITH PROPOFOL N/A 06/20/2021   Procedure: COLONOSCOPY WITH PROPOFOL;  Surgeon: Irene Shipper, MD;  Location: [facility];  Service: Endoscopy;  Laterality: N/A;   COLONOSCOPY WITH PROPOFOL N/A 09/16/2021   Procedure: COLONOSCOPY WITH PROPOFOL;  Surgeon: Gatha Mayer, MD;  Location: [facility];  Service: Gastroenterology;  Laterality: N/A;   ENDARTERECTOMY FEMORAL Right 08/20/2020   Procedure: ENDARTERECTOMY  RIGHT FEMORAL ARTERY;  Surgeon: Marty Heck, MD;  Location: [place];  Service: Vascular;  Laterality: Right;   ENTEROSCOPY N/A 10/11/2021   Procedure: ENTEROSCOPY;  Surgeon: Harvel Quale, MD;  [facility];  Service: Gastroenterology;  Laterality: N/A;   ESOPHAGOGASTRODUODENOSCOPY N/A 09/16/2021   Procedure: ESOPHAGOGASTRODUODENOSCOPY (EGD);  Surgeon: Gatha Mayer, MD;  Location: [place];  Service: Gastroenterology;  Laterality: N/A;   ESOPHAGOGASTRODUODENOSCOPY (EGD) WITH PROPOFOL N/A 09/06/2020   Procedure: ESOPHAGOGASTRODUODENOSCOPY (EGD) WITH PROPOFOL;  Surgeon: Irene Shipper, MD;  Location: [facility];  Service: Endoscopy;  Laterality: N/A;   ESOPHAGOGASTRODUODENOSCOPY (EGD) WITH PROPOFOL N/A 10/11/2021   Procedure: ESOPHAGOGASTRODUODENOSCOPY (EGD) WITH PROPOFOL;  Surgeon: Harvel Quale, MD;  [facility];  Service: Gastroenterology;  Laterality: N/A;   HEMOSTASIS CLIP PLACEMENT  09/16/2021   Procedure: HEMOSTASIS  CLIP PLACEMENT;  Surgeon: Gatha Mayer, MD;  Location: [place];  Service: Gastroenterology;;   HOT HEMOSTASIS N/A 09/16/2021   Procedure: HOT HEMOSTASIS (ARGON PLASMA COAGULATION/BICAP);  Surgeon: Gatha Mayer, MD;  Location: [place];  Service: Gastroenterology;  Laterality: N/A;   HOT HEMOSTASIS  10/11/2021   Procedure: HOT HEMOSTASIS (ARGON PLASMA COAGULATION/BICAP);  Surgeon: Montez Morita, Quillian Quince, MD;  [facility];  Service: Gastroenterology;;   INSERTION OF ILIAC STENT Right 08/20/2020   Procedure: RETROGRADE INSERTION OF RIGHT ILIAC STENT;  Surgeon: Marty Heck, MD;  Location: [place];  Service: Vascular;  Laterality: Right;   INTRAOPERATIVE ARTERIOGRAM Right 08/20/2020   Procedure: INTRA OPERATIVE ARTERIOGRAM ILIAC;  Surgeon: Marty Heck, MD;  Location: [place];  Service: Vascular;  Laterality: Right;   PATCH ANGIOPLASTY Right 08/20/2020   Procedure: PATCH ANGIOPLASTY RIGHT FEMORAL ARTERY;  Surgeon: Marty Heck, MD;  Location: [place];  Service: Vascular;  Laterality: Right;   PERIPHERAL VASCULAR BALLOON ANGIOPLASTY Right 05/30/2021   Procedure: PERIPHERAL VASCULAR BALLOON ANGIOPLASTY;  Surgeon: Marty Heck, MD;  Location: [place] [facility];  Service: Cardiovascular;  Laterality: Right;   PORTACATH PLACEMENT Left 06/13/2016   Procedure: INSERTION PORT-A-CATH;  Surgeon: Aviva Signs, MD;  Location: [facility];  Service: General;  Laterality: Left;   TRANSURETHRAL RESECTION OF PROSTATE N/A 05/31/2020   Procedure: TRANSURETHRAL RESECTION OF THE PROSTATE (TURP);  Surgeon: Cleon Gustin, MD;  Location: [facility];  Service: Urology;  Laterality: N/A;   VIDEO BRONCHOSCOPY WITH ENDOBRONCHIAL NAVIGATION N/A 05/28/2016   Procedure: VIDEO BRONCHOSCOPY WITH ENDOBRONCHIAL NAVIGATION;  Surgeon: [name], MD;  [facility];  Service: Thoracic;  Laterality: N/A;   VIDEO BRONCHOSCOPY WITH ENDOBRONCHIAL ULTRASOUND N/A 05/28/2016   Procedure:  VIDEO BRONCHOSCOPY WITH ENDOBRONCHIAL ULTRASOUND;  Surgeon: [name], MD;  Location: [facility];  Service: Thoracic;  Laterality: N/A;    Family History  Problem Relation Age of Onset   Hypertension Mother    Diabetes Father    Heart disease Father    Stroke Father    Hypertension Sister     SOCIAL HISTORY: Social History   Tobacco Use   Smoking status: Every Day    Packs/day: 0.50    Years: 55.00    Total pack years: 27.50    Types: Cigarettes    Start date: 03/11/1961   Smokeless tobacco: Never   Tobacco comments:    1/2 pack to 1 pack per day 12/14/19  Substance Use Topics   Alcohol use: No    Allergies  Allergen Reactions   Sulfa Antibiotics Swelling    Mouth swelling   Atorvastatin Other (See Comments)    Muscle aches - tolerating Pravastatin 40 mg MWF   Jardiance [Empagliflozin] Other (See Comments)    FEELS SLUGGISH, TIRED   Lopressor [Metoprolol] Other (See Comments)    Fatigue   Rosuvastatin Other (See Comments)    Muscle aches - tolerating Pravastatin 40 mg MWF   Doxycycline Nausea Only   Temazepam Other (See Comments)    Made insomnia worse     Current Outpatient Medications  Medication Sig Dispense Refill   Blood Glucose Monitoring Suppl (ONETOUCH VERIO REFLECT) w/Device KIT Use to test blood  sugars daily as directed. DX: E11.9 1 kit 1   [employer] IRON 325 (65 Fe) MG tablet TAKE 1 TABLET (325MG) TWICE DAILY 200 tablet 1   Cyanocobalamin (B-12 PO) Take 1 tablet by mouth daily.     dutasteride (AVODART) 0.5 MG capsule Take 1 capsule (0.5 mg total) by mouth daily. 90 capsule 3   famotidine (PEPCID) 20 MG tablet One after supper (Patient taking differently: Take 20 mg by mouth daily.) 30 tablet 11   fluticasone (FLONASE) 50 MCG/ACT nasal spray PLACE 1 SPRAY INTO BOTH NOSTRILS DAILY AS NEEDED FOR ALLERGIES OR RHINITIS. 48 mL 3   Fluticasone-Umeclidin-Vilant (TRELEGY ELLIPTA) 100-62.5-25 MCG/ACT AEPB Inhale 1 puff into the lungs daily. 1 each 11   gabapentin  (NEURONTIN) 300 MG capsule TAKE 1 CAPSULE BY MOUTH THREE TIMES A DAY 270 capsule 0   KLOR-CON M20 20 MEQ tablet TAKE 1 TABLET BY MOUTH EVERY DAY 90 tablet 0   levothyroxine (SYNTHROID) 50 MCG tablet TAKE 1 TABLET BY MOUTH EVERY DAY (Patient taking differently: Take 50 mcg by mouth daily before breakfast.) 90 tablet 1   linaclotide (LINZESS) 72 MCG capsule Take 1 capsule (72 mcg total) by mouth daily as needed (constipation).     Melatonin 10 MG TABS Take 10 mg by mouth at bedtime as needed (sleep).     metolazone (ZAROXOLYN) 2.5 MG tablet Take 1 tablet (2.5 mg total) by mouth 3 (three) times a week. 12 tablet 0   NON FORMULARY CPAP at bedtime     ondansetron (ZOFRAN) 4 MG tablet Take 1 tablet (4 mg total) by mouth every 8 (eight) hours as needed for nausea or vomiting. 60 tablet 0   OneTouch Delica Lancets 72C MISC Use to test blood sugars daily as directed. DX: E11.9 100 each 1   ONETOUCH VERIO test strip USE TO TEST BLOOD SUGARS DAILY AS DIRECTED. DX: E11.9 100 strip 12   OXYGEN Inhale 2 L into the lungs daily as needed.     pantoprazole (PROTONIX) 40 MG tablet Take 1 tablet (40 mg total) by mouth 2 (two) times daily before a meal. 60 tablet 5   pravastatin (PRAVACHOL) 40 MG tablet TAKE 1 TABLET (40 MG TOTAL) BY MOUTH ON MONDAYS , WEDNESDAYS, AND FRIDAYS AS DIRECTED (Patient taking differently: Take 40 mg by mouth 3 (three) times a week. Mon, wed, and fri) 48 tablet 0   Semaglutide (RYBELSUS) 7 MG TABS Take 7 mg by mouth daily. 90 tablet 1   spironolactone (ALDACTONE) 25 MG tablet Take 1 tablet (25 mg total) by mouth daily. 90 tablet 3   torsemide (DEMADEX) 20 MG tablet TAKE 2 TABLETS (40 MG) IN THE MORNING, TAKE 2 TABLETS (40 MG) IN THE EVENING DAILY AS NEEDED. 120 tablet 2   zolpidem (AMBIEN) 5 MG tablet Take 1 tablet (5 mg total) by mouth at bedtime as needed for sleep. 30 tablet 2   No current facility-administered medications for this visit.    REVIEW OF SYSTEMS:  _0  denotes positive  finding, _1  denotes negative finding Cardiac  Comments:  Chest pain or chest pressure:    Shortness of breath upon exertion:    Short of breath when lying flat:    Irregular heart rhythm:        Vascular    Pain in calf, thigh, or hip brought on by ambulation:    Pain in feet at night that wakes you up from your sleep:     Blood clot in your veins:  Leg swelling:         Pulmonary    Oxygen at home:    Productive cough:     Wheezing:         Neurologic    Sudden weakness in arms or legs:     Sudden numbness in arms or legs:     Sudden onset of difficulty speaking or slurred speech:    Temporary loss of vision in one eye:     Problems with dizziness:         Gastrointestinal    Blood in stool:     Vomited blood:         Genitourinary    Burning when urinating:     Blood in urine:        Psychiatric    Major depression:         Hematologic    Bleeding problems:    Problems with blood clotting too easily:        Skin    Rashes or ulcers:        Constitutional    Fever or chills:      PHYSICAL EXAM: Vitals:   12/10/21 0934  BP: 113/70  Pulse: 86  Resp: 14  Temp: (!) 97.5 F (36.4 C)  TempSrc: Temporal  SpO2: 98%  Weight: 144 lb (65.3 kg)  Height: _0  (1.676 m)    GENERAL: The patient is a well-nourished male, in no acute distress. The vital signs are documented above. CARDIAC: There is a regular rate and rhythm.  VASCULAR:  Previous right groin incision well healed Palpable femoral pulses bilaterally Brisk DP PT Doppler signals bilateral lower extremities with no tissue loss  DATA:   ABIs today are stable on the right at 0.84  Right lower extremity arterial duplex shows no high grade flow-limiting stenosis in the right lower extremity.  Assessment/Plan:  76 year old male status post right common femoral endarterectomy with profundoplasty and bovine pericardial patch angioplasty and right common iliac stent on 08/20/2020 for short distance  lifestyle limiting claudication.  He developed recurrent calf claudication and started back smoking.  Ultimately most recently underwent right common iliac angioplasty with DCB as well as right SFA and above-knee popliteal angioplasty with DCB on 05/30/2021.  His ABIs are stable in the right lower extremity at 0.84.  No evidence of recurrent high-grade stenosis.  Discussed the importance of smoking cessation.  I have asked that when he has follow-up with his GI doctor discussed that he can restart at least an 81 mg aspirin to help maintain stent patency.  I will see him in 6 months with aortoiliac duplex, right leg arterial duplex, and ABIs.   Marty Heck, MD [facility] Office: [phone]

## 2021-12-10 NOTE — Telephone Encounter (Signed)
Calling to report fall from Thursday 8/31 - patient refuses appointment

## 2021-12-12 ENCOUNTER — Other Ambulatory Visit: Payer: Self-pay

## 2021-12-12 ENCOUNTER — Encounter: Payer: Self-pay | Admitting: Cardiovascular Disease

## 2021-12-12 ENCOUNTER — Ambulatory Visit: Payer: Medicare Other | Attending: Cardiovascular Disease | Admitting: Cardiovascular Disease

## 2021-12-12 VITALS — BP 118/64 | HR 89 | Ht 66.0 in | Wt 148.0 lb

## 2021-12-12 DIAGNOSIS — I739 Peripheral vascular disease, unspecified: Secondary | ICD-10-CM

## 2021-12-12 DIAGNOSIS — I5032 Chronic diastolic (congestive) heart failure: Secondary | ICD-10-CM

## 2021-12-12 DIAGNOSIS — D649 Anemia, unspecified: Secondary | ICD-10-CM | POA: Diagnosis not present

## 2021-12-12 DIAGNOSIS — I4892 Unspecified atrial flutter: Secondary | ICD-10-CM

## 2021-12-12 MED ORDER — APIXABAN 5 MG PO TABS: 5.0000 mg | ORAL_TABLET | Freq: Two times a day (BID) | ORAL | 2 refills | Status: DC

## 2021-12-12 MED ORDER — TORSEMIDE 20 MG PO TABS: ORAL_TABLET | ORAL | 2 refills | Status: DC

## 2021-12-12 NOTE — Patient Instructions (Addendum)
Medication Instructions:  START BACK on Eliquis 5 mg twice daily  DECREASE Torsemide to 40 in the AM.   *If you need a refill on your cardiac medications before your next appointment, please call your pharmacy*  Follow-Up: At [facility], you and your health needs are our priority.  As part of our continuing mission to provide you with exceptional heart care, we have created designated Provider Care Teams.  These Care Teams include your primary Cardiologist (physician) and Advanced Practice Providers (APPs -  Physician Assistants and Nurse Practitioners) who all work together to provide you with the care you need, when you need it.  We recommend signing up for the patient portal called "MyChart".  Sign up information is provided on this After Visit Summary.  MyChart is used to connect with patients for Virtual Visits (Telemedicine).  Patients are able to view lab/test results, encounter notes, upcoming appointments, etc.  Non-urgent messages can be sent to your provider as well.   To learn more about what you can do with MyChart, go to [email or web address].    Your next appointment:   3 months  The format for your next appointment:   In Person  Provider:   Evalina Field, MD      Referral to EP- they will contact you for an appointment.

## 2021-12-13 ENCOUNTER — Institutional Professional Consult (permissible substitution): Payer: Medicare Other | Admitting: Pulmonary Disease

## 2021-12-13 DIAGNOSIS — J449 Chronic obstructive pulmonary disease, unspecified: Secondary | ICD-10-CM | POA: Diagnosis not present

## 2021-12-13 DIAGNOSIS — K922 Gastrointestinal hemorrhage, unspecified: Secondary | ICD-10-CM | POA: Diagnosis not present

## 2021-12-13 DIAGNOSIS — E1159 Type 2 diabetes mellitus with other circulatory complications: Secondary | ICD-10-CM | POA: Diagnosis not present

## 2021-12-13 DIAGNOSIS — I5033 Acute on chronic diastolic (congestive) heart failure: Secondary | ICD-10-CM | POA: Diagnosis not present

## 2021-12-13 DIAGNOSIS — D62 Acute posthemorrhagic anemia: Secondary | ICD-10-CM | POA: Diagnosis not present

## 2021-12-13 DIAGNOSIS — I152 Hypertension secondary to endocrine disorders: Secondary | ICD-10-CM | POA: Diagnosis not present

## 2021-12-16 ENCOUNTER — Encounter: Payer: Self-pay | Admitting: *Deleted

## 2021-12-16 ENCOUNTER — Encounter: Payer: Self-pay | Admitting: Cardiology

## 2021-12-16 ENCOUNTER — Ambulatory Visit: Payer: Medicare Other | Attending: Cardiology | Admitting: Cardiology

## 2021-12-16 VITALS — BP 118/72 | HR 73 | Ht 66.0 in | Wt 145.0 lb

## 2021-12-16 DIAGNOSIS — Z01818 Encounter for other preprocedural examination: Secondary | ICD-10-CM | POA: Diagnosis not present

## 2021-12-16 DIAGNOSIS — I10 Essential (primary) hypertension: Secondary | ICD-10-CM

## 2021-12-16 DIAGNOSIS — I5032 Chronic diastolic (congestive) heart failure: Secondary | ICD-10-CM

## 2021-12-16 DIAGNOSIS — I483 Typical atrial flutter: Secondary | ICD-10-CM | POA: Diagnosis not present

## 2021-12-16 NOTE — H&P (View-Only) (Signed)
Electrophysiology Office Note:    Date:  12/16/2021   ID:  John [name], DOB 06-04-1945, MRN [id number]  PCP:  [name], [facility] Cardiologist:  Evalina Field, MD  [facility] Electrophysiologist:  Vickie Epley, MD   Referring MD: Geralynn Rile, *   Chief Complaint: Atrial flutter  History of Present Illness:    John [name] is a 76 y.o. male who presents for an evaluation of atrial flutter at the request of Dr. Audie Box. Their medical history includes atrial flutter, GI bleeding, non-small cell lung cancer post chemo and radiation in 2018, COPD, peripheral arterial disease, chronic diastolic heart failure.  The patient was seen by Dr. Audie Box December 12, 2021.  His diagnosis of atrial flutter dates back to May 2022.   The patient has significant GI bleeding history.  He has had multiple AVMs diagnosed this year.  He had radiation gastritis in June of this year.  Today, he is accompanied by a family member. He is currently back on Eliquis. He has noticed that his stool looks dark-colored, but he doesn't believe it is hematochezia. Of note, he has also been constipated lately and has been taking Linzess as needed.  Later this month, he is scheduled for a repeat chest CT scan for continued monitoring of his lung cancer. At this time there are no planned procedures.  He denies any palpitations, chest pain, shortness of breath, or peripheral edema. No lightheadedness, headaches, syncope, orthopnea, or PND.     Past Medical History:  Diagnosis Date   Adenocarcinoma of lung, right ([place]) 04/18/2016   Anemia    Anxiety    Arthritis    Asthma    Atrial flutter (HCC)    BPH (benign prostatic hyperplasia)    with urinary retention 02/06/20   CHF (congestive heart failure) (HCC)    COPD (chronic obstructive pulmonary disease) ([place])    Depression    Diabetes mellitus, type II ([place])    Dyspnea    History of kidney stones    History of radiation  therapy    right lung 09/10/2020-09/17/2020   Dr Sondra Come   Hyperlipidemia    Hypertension    Hypothyroidism    Macular degeneration    Neuropathy    Non-small cell lung cancer, right ([place]) 04/18/2016   Peripheral vascular disease ([place])    Prostatitis    Pulmonary nodule, left 07/16/2016   Sleep apnea    cpap    Past Surgical History:  Procedure Laterality Date   ABDOMINAL AORTOGRAM W/LOWER EXTREMITY Left 02/06/2020   Procedure: ABDOMINAL AORTOGRAM W/LOWER EXTREMITY;  Surgeon: Lorretta Harp, MD;  Location: [place] [facility];  Service: Cardiovascular;  Laterality: Left;   ABDOMINAL AORTOGRAM W/LOWER EXTREMITY N/A 05/30/2021   Procedure: ABDOMINAL AORTOGRAM W/LOWER EXTREMITY;  Surgeon: Marty Heck, MD;  Location: [place] [facility];  Service: Cardiovascular;  Laterality: N/A;   BIOPSY  09/16/2021   Procedure: BIOPSY;  Surgeon: Gatha Mayer, MD;  Location: [place];  Service: Gastroenterology;;   CARDIOVERSION N/A 10/05/2020   Procedure: CARDIOVERSION;  Surgeon: Geralynn Rile, MD;  Location: [place];  Service: Cardiovascular;  Laterality: N/A;   CATARACT EXTRACTION, BILATERAL Bilateral    COLONOSCOPY WITH PROPOFOL N/A 06/20/2021   Procedure: COLONOSCOPY WITH PROPOFOL;  Surgeon: Irene Shipper, MD;  Location: [facility];  Service: Endoscopy;  Laterality: N/A;   COLONOSCOPY WITH PROPOFOL N/A 09/16/2021   Procedure: COLONOSCOPY WITH PROPOFOL;  Surgeon: Gatha Mayer,  MD;  Location: [place];  Service: Gastroenterology;  Laterality: N/A;   ENDARTERECTOMY FEMORAL Right 08/20/2020   Procedure: ENDARTERECTOMY  RIGHT FEMORAL ARTERY;  Surgeon: Marty Heck, MD;  Location: [place];  Service: Vascular;  Laterality: Right;   ENTEROSCOPY N/A 10/11/2021   Procedure: ENTEROSCOPY;  Surgeon: Harvel Quale, MD;  [facility];  Service: Gastroenterology;  Laterality: N/A;   ESOPHAGOGASTRODUODENOSCOPY N/A 09/16/2021   Procedure:  ESOPHAGOGASTRODUODENOSCOPY (EGD);  Surgeon: Gatha Mayer, MD;  Location: [place];  Service: Gastroenterology;  Laterality: N/A;   ESOPHAGOGASTRODUODENOSCOPY (EGD) WITH PROPOFOL N/A 09/06/2020   Procedure: ESOPHAGOGASTRODUODENOSCOPY (EGD) WITH PROPOFOL;  Surgeon: Irene Shipper, MD;  Location: [facility];  Service: Endoscopy;  Laterality: N/A;   ESOPHAGOGASTRODUODENOSCOPY (EGD) WITH PROPOFOL N/A 10/11/2021   Procedure: ESOPHAGOGASTRODUODENOSCOPY (EGD) WITH PROPOFOL;  Surgeon: Harvel Quale, MD;  [facility];  Service: Gastroenterology;  Laterality: N/A;   HEMOSTASIS CLIP PLACEMENT  09/16/2021   Procedure: HEMOSTASIS CLIP PLACEMENT;  Surgeon: Gatha Mayer, MD;  Location: [place];  Service: Gastroenterology;;   HOT HEMOSTASIS N/A 09/16/2021   Procedure: HOT HEMOSTASIS (ARGON PLASMA COAGULATION/BICAP);  Surgeon: Gatha Mayer, MD;  Location: [place];  Service: Gastroenterology;  Laterality: N/A;   HOT HEMOSTASIS  10/11/2021   Procedure: HOT HEMOSTASIS (ARGON PLASMA COAGULATION/BICAP);  Surgeon: Montez Morita, Quillian Quince, MD;  [facility];  Service: Gastroenterology;;   INSERTION OF ILIAC STENT Right 08/20/2020   Procedure: RETROGRADE INSERTION OF RIGHT ILIAC STENT;  Surgeon: Marty Heck, MD;  Location: [place];  Service: Vascular;  Laterality: Right;   INTRAOPERATIVE ARTERIOGRAM Right 08/20/2020   Procedure: INTRA OPERATIVE ARTERIOGRAM ILIAC;  Surgeon: Marty Heck, MD;  Location: [place];  Service: Vascular;  Laterality: Right;   PATCH ANGIOPLASTY Right 08/20/2020   Procedure: PATCH ANGIOPLASTY RIGHT FEMORAL ARTERY;  Surgeon: Marty Heck, MD;  Location: [place];  Service: Vascular;  Laterality: Right;   PERIPHERAL VASCULAR BALLOON ANGIOPLASTY Right 05/30/2021   Procedure: PERIPHERAL VASCULAR BALLOON ANGIOPLASTY;  Surgeon: Marty Heck, MD;  Location: [place] [facility];  Service: Cardiovascular;  Laterality: Right;    PORTACATH PLACEMENT Left 06/13/2016   Procedure: INSERTION PORT-A-CATH;  Surgeon: Aviva Signs, MD;  [facility];  Service: General;  Laterality: Left;   TRANSURETHRAL RESECTION OF PROSTATE N/A 05/31/2020   Procedure: TRANSURETHRAL RESECTION OF THE PROSTATE (TURP);  Surgeon: Cleon Gustin, MD;  [facility];  Service: Urology;  Laterality: N/A;   VIDEO BRONCHOSCOPY WITH ENDOBRONCHIAL NAVIGATION N/A 05/28/2016   Procedure: VIDEO BRONCHOSCOPY WITH ENDOBRONCHIAL NAVIGATION;  Surgeon: [name], MD;  Location: [place];  Service: Thoracic;  Laterality: N/A;   VIDEO BRONCHOSCOPY WITH ENDOBRONCHIAL ULTRASOUND N/A 05/28/2016   Procedure: VIDEO BRONCHOSCOPY WITH ENDOBRONCHIAL ULTRASOUND;  Surgeon: [name], MD;  [facility];  Service: Thoracic;  Laterality: N/A;    Current Medications: Current Meds  Medication Sig   apixaban (ELIQUIS) 5 MG TABS tablet Take 1 tablet (5 mg total) by mouth 2 (two) times daily.   Blood Glucose Monitoring Suppl (ONETOUCH VERIO REFLECT) w/Device KIT Use to test blood sugars daily as directed. DX: E11.9   [employer] IRON 325 (65 Fe) MG tablet TAKE 1 TABLET (325MG) TWICE DAILY   Cyanocobalamin (B-12 PO) Take 1 tablet by mouth daily.   dutasteride (AVODART) 0.5 MG capsule Take 1 capsule (0.5 mg total) by mouth daily.   famotidine (PEPCID) 20 MG tablet One after supper (Patient taking differently: Take 20  mg by mouth daily.)   fluticasone (FLONASE) 50 MCG/ACT nasal spray PLACE 1 SPRAY INTO BOTH NOSTRILS DAILY AS NEEDED FOR ALLERGIES OR RHINITIS.   Fluticasone-Umeclidin-Vilant (TRELEGY ELLIPTA) 100-62.5-25 MCG/ACT AEPB Inhale 1 puff into the lungs daily.   gabapentin (NEURONTIN) 300 MG capsule TAKE 1 CAPSULE BY MOUTH THREE TIMES A DAY   KLOR-CON M20 20 MEQ tablet TAKE 1 TABLET BY MOUTH EVERY DAY   levothyroxine (SYNTHROID) 50 MCG tablet TAKE 1 TABLET BY MOUTH EVERY DAY (Patient taking differently: Take 50 mcg by mouth daily before breakfast.)    linaclotide (LINZESS) 72 MCG capsule Take 1 capsule (72 mcg total) by mouth daily as needed (constipation).   Melatonin 10 MG TABS Take 10 mg by mouth at bedtime as needed (sleep).   NON FORMULARY CPAP at bedtime   ondansetron (ZOFRAN) 4 MG tablet Take 1 tablet (4 mg total) by mouth every 8 (eight) hours as needed for nausea or vomiting.   OneTouch Delica Lancets 51O MISC Use to test blood sugars daily as directed. DX: E11.9   ONETOUCH VERIO test strip USE TO TEST BLOOD SUGARS DAILY AS DIRECTED. DX: E11.9   OXYGEN Inhale 2 L into the lungs daily as needed.   pantoprazole (PROTONIX) 40 MG tablet Take 1 tablet (40 mg total) by mouth 2 (two) times daily before a meal.   pravastatin (PRAVACHOL) 40 MG tablet TAKE 1 TABLET (40 MG TOTAL) BY MOUTH ON MONDAYS , WEDNESDAYS, AND FRIDAYS AS DIRECTED (Patient taking differently: Take 40 mg by mouth 3 (three) times a week. Mon, wed, and fri)   Semaglutide (RYBELSUS) 7 MG TABS Take 7 mg by mouth daily.   spironolactone (ALDACTONE) 25 MG tablet Take 1 tablet (25 mg total) by mouth daily.   torsemide (DEMADEX) 20 MG tablet Take 2 tablets (40 mg) in the AM.   zolpidem (AMBIEN) 5 MG tablet Take 1 tablet (5 mg total) by mouth at bedtime as needed for sleep.     Allergies:   Sulfa antibiotics, Atorvastatin, Jardiance [empagliflozin], Lopressor [metoprolol], Rosuvastatin, Doxycycline, and Temazepam   Social History   Socioeconomic History   Marital status: Soil scientist    Spouse name: Not on file   Number of children: 5   Years of education: 10   Highest education level: 10th grade  Occupational History   Occupation: Retired  Tobacco Use   Smoking status: Every Day    Packs/day: 0.50    Years: 55.00    Total pack years: 27.50    Types: Cigarettes    Start date: 03/11/1961   Smokeless tobacco: Never   Tobacco comments:    1/2 pack to 1 pack per day 12/14/19  Vaping Use   Vaping Use: Never used  Substance and Sexual Activity   Alcohol use: No    Drug use: No   Sexual activity: Yes    Birth control/protection: None  Other Topics Concern   Not on file  Social History Narrative   [name] is his POA and lives with him. Children out of town - one in [facility], others in [place].   Social Determinants of Health   Financial Resource Strain: Low Risk  (11/15/2021)   Overall Financial Resource Strain (CARDIA)    Difficulty of Paying Living Expenses: Not hard at all  Food Insecurity: No Food Insecurity (11/15/2021)   Hunger Vital Sign    Worried About Running Out of Food in the Last Year: Never true    Ran Out of Food in the  Last Year: Never true  Transportation Needs: No Transportation Needs (11/15/2021)   PRAPARE - Hydrologist (Medical): No    Lack of Transportation (Non-Medical): No  Physical Activity: Inactive (11/15/2021)   Exercise Vital Sign    Days of Exercise per Week: 0 days    Minutes of Exercise per Session: 0 min  Stress: No Stress Concern Present (11/15/2021)   [place]    Feeling of Stress : Only a little  Social Connections: Moderately Isolated (11/15/2021)   Social Connection and Isolation Panel [NHANES]    Frequency of Communication with Friends and Family: More than three times a week    Frequency of Social Gatherings with Friends and Family: Once a week    Attends Religious Services: Never    Marine scientist or Organizations: No    Attends Music therapist: Never    Marital Status: Living with partner     Family History: The patient's family history includes Diabetes in his father; Heart disease in his father; Hypertension in his mother and sister; Stroke in his father.  ROS:   Please see the history of present illness.    (+) Constipation All other systems reviewed and are negative.  EKGs/Labs/Other Studies Reviewed:    The following studies were reviewed today:  September 15, 2021 echo EF  55% RV normal Mildly dilated left atrium No MR   EKGs for the past year were personally reviewed and showed no evidence of atrial fibrillation.  There is only atrial flutter that is typical appearing.   EKG:   EKG is personally reviewed.  12/16/2021:  EKG was not ordered.   Recent Labs: 09/16/2021: Magnesium 2.2 10/10/2021: B Natriuretic Peptide 191.0 10/14/2021: Hemoglobin 9.1; Platelets 326 11/25/2021: ALT 20; BUN 46; Creatinine, Ser 1.24; Potassium 4.1; Sodium 134; TSH 1.960   Recent Lipid Panel    Component Value Date/Time   CHOL 126 08/21/2020 0530   CHOL 136 04/25/2019 1608   TRIG 89 08/21/2020 0530   HDL 28 (L) 08/21/2020 0530   HDL 28 (L) 04/25/2019 1608   CHOLHDL 4.5 08/21/2020 0530   VLDL 18 08/21/2020 0530   LDLCALC 80 08/21/2020 0530   LDLCALC 78 04/25/2019 1608    Physical Exam:    VS:  BP 118/72   Pulse 73   Ht 5' 6"  (1.676 m)   Wt 145 lb (65.8 kg)   SpO2 96%   BMI 23.40 kg/m     Wt Readings from Last 3 Encounters:  12/16/21 145 lb (65.8 kg)  12/12/21 148 lb (67.1 kg)  12/10/21 144 lb (65.3 kg)     GEN: Well nourished, well developed in no acute distress.  Elderly HEENT: Normal NECK: No JVD; No carotid bruits LYMPHATICS: No lymphadenopathy CARDIAC: RRR, no murmurs, rubs, gallops RESPIRATORY:  Clear to auscultation without rales, wheezing or rhonchi  ABDOMEN: Soft, non-tender, non-distended MUSCULOSKELETAL:  No edema; No deformity  SKIN: Warm and dry.  Scattered lesions on his lower extremities and hands without signs of infection. NEUROLOGIC:  Alert and oriented x 3 PSYCHIATRIC:  Normal affect       ASSESSMENT:    1. Typical atrial flutter ([place])   2. Chronic diastolic heart failure ([place])   3. Primary hypertension    PLAN:    In order of problems listed above:  #Typical atrial flutter No evidence of AF thus far.   Discussed treatment options today for his AFL  including antiarrhythmic drug therapy and ablation. Discussed risks,  recovery and likelihood of success. Discussed potential need for repeat ablation procedures and antiarrhythmic drugs after an initial ablation. They wish to proceed with scheduling. Discussed the possibility of developing AF after a AFL ablation.   Risk, benefits, and alternatives to EP study and radiofrequency ablation for AFL were also discussed in detail today. These risks include but are not limited to stroke, bleeding, vascular damage, tamponade, perforation, damage to the esophagus, lungs, and other structures, worsening renal function, and death. The patient understands these risk and wishes to proceed.  We will therefore proceed with catheter ablation at the next available time.  Carto, ICE, anesthesia are requested for the procedure.     Plan for ILR implant 3 months after CTI ablation. If no recurrence on loop recorder monitoring, can plan for anticoagulation discontinuation.  If he were to develop recurrence of atrial flutter or develop atrial fibrillation post CTI ablation, will discuss left atrial appendage occlusion.    Medication Adjustments/Labs and Tests Ordered: Current medicines are reviewed at length with the patient today.  Concerns regarding medicines are outlined above.  No orders of the defined types were placed in this encounter.  No orders of the defined types were placed in this encounter.   I,Mathew Stumpf,acting as a Education administrator for Vickie Epley, MD.,have documented all relevant documentation on the behalf of Vickie Epley, MD,as directed by  Vickie Epley, MD while in the presence of Vickie Epley, MD.  I, Vickie Epley, MD, have reviewed all documentation for this visit. The documentation on 12/16/21 for the exam, diagnosis, procedures, and orders are all accurate and complete.   Signed, Hilton Cork. Quentin Ore, MD, [facility], [facility] 12/16/2021 2:03 PM    Electrophysiology [facility]

## 2021-12-16 NOTE — Progress Notes (Signed)
Electrophysiology Office Note:    Date:  12/16/2021   ID:  John [name], DOB 20-Feb-1946, MRN [id number]  PCP:  [name], [facility] Cardiologist:  Evalina Field, MD  [facility] Electrophysiologist:  Vickie Epley, MD   Referring MD: John [name], *   Chief Complaint: Atrial flutter  History of Present Illness:    John [name] is a 76 y.o. male who presents for an evaluation of atrial flutter at the request of Dr. Audie Box. Their medical history includes atrial flutter, GI bleeding, non-small cell lung cancer post chemo and radiation in 2018, COPD, peripheral arterial disease, chronic diastolic heart failure.  The patient was seen by Dr. Audie Box December 12, 2021.  His diagnosis of atrial flutter dates back to May 2022.   The patient has significant GI bleeding history.  He has had multiple AVMs diagnosed this year.  He had radiation gastritis in June of this year.  Today, he is accompanied by a family member. He is currently back on Eliquis. He has noticed that his stool looks dark-colored, but he doesn't believe it is hematochezia. Of note, he has also been constipated lately and has been taking Linzess as needed.  Later this month, he is scheduled for a repeat chest CT scan for continued monitoring of his lung cancer. At this time there are no planned procedures.  He denies any palpitations, chest pain, shortness of breath, or peripheral edema. No lightheadedness, headaches, syncope, orthopnea, or PND.     Past Medical History:  Diagnosis Date   Adenocarcinoma of lung, right ([place]) 04/18/2016   Anemia    Anxiety    Arthritis    Asthma    Atrial flutter (HCC)    BPH (benign prostatic hyperplasia)    with urinary retention 02/06/20   CHF (congestive heart failure) (HCC)    COPD (chronic obstructive pulmonary disease) ([place])    Depression    Diabetes mellitus, type II ([place])    Dyspnea    History of kidney stones    History of radiation  therapy    right lung 09/10/2020-09/17/2020   Dr Sondra Come   Hyperlipidemia    Hypertension    Hypothyroidism    Macular degeneration    Neuropathy    Non-small cell lung cancer, right ([place]) 04/18/2016   Peripheral vascular disease ([place])    Prostatitis    Pulmonary nodule, left 07/16/2016   Sleep apnea    cpap    Past Surgical History:  Procedure Laterality Date   ABDOMINAL AORTOGRAM W/LOWER EXTREMITY Left 02/06/2020   Procedure: ABDOMINAL AORTOGRAM W/LOWER EXTREMITY;  Surgeon: Lorretta Harp, MD;  Location: [place] [facility];  Service: Cardiovascular;  Laterality: Left;   ABDOMINAL AORTOGRAM W/LOWER EXTREMITY N/A 05/30/2021   Procedure: ABDOMINAL AORTOGRAM W/LOWER EXTREMITY;  Surgeon: Marty Heck, MD;  Location: [place] [facility];  Service: Cardiovascular;  Laterality: N/A;   BIOPSY  09/16/2021   Procedure: BIOPSY;  Surgeon: Gatha Mayer, MD;  Location: [place];  Service: Gastroenterology;;   CARDIOVERSION N/A 10/05/2020   Procedure: CARDIOVERSION;  Surgeon: John Rile, MD;  Location: [place];  Service: Cardiovascular;  Laterality: N/A;   CATARACT EXTRACTION, BILATERAL Bilateral    COLONOSCOPY WITH PROPOFOL N/A 06/20/2021   Procedure: COLONOSCOPY WITH PROPOFOL;  Surgeon: Irene Shipper, MD;  Location: [facility];  Service: Endoscopy;  Laterality: N/A;   COLONOSCOPY WITH PROPOFOL N/A 09/16/2021   Procedure: COLONOSCOPY WITH PROPOFOL;  Surgeon: Gatha Mayer,  MD;  Location: [place];  Service: Gastroenterology;  Laterality: N/A;   ENDARTERECTOMY FEMORAL Right 08/20/2020   Procedure: ENDARTERECTOMY  RIGHT FEMORAL ARTERY;  Surgeon: Marty Heck, MD;  Location: [place];  Service: Vascular;  Laterality: Right;   ENTEROSCOPY N/A 10/11/2021   Procedure: ENTEROSCOPY;  Surgeon: Harvel Quale, MD;  [facility];  Service: Gastroenterology;  Laterality: N/A;   ESOPHAGOGASTRODUODENOSCOPY N/A 09/16/2021   Procedure:  ESOPHAGOGASTRODUODENOSCOPY (EGD);  Surgeon: Gatha Mayer, MD;  Location: [place];  Service: Gastroenterology;  Laterality: N/A;   ESOPHAGOGASTRODUODENOSCOPY (EGD) WITH PROPOFOL N/A 09/06/2020   Procedure: ESOPHAGOGASTRODUODENOSCOPY (EGD) WITH PROPOFOL;  Surgeon: Irene Shipper, MD;  Location: [facility];  Service: Endoscopy;  Laterality: N/A;   ESOPHAGOGASTRODUODENOSCOPY (EGD) WITH PROPOFOL N/A 10/11/2021   Procedure: ESOPHAGOGASTRODUODENOSCOPY (EGD) WITH PROPOFOL;  Surgeon: Harvel Quale, MD;  [facility];  Service: Gastroenterology;  Laterality: N/A;   HEMOSTASIS CLIP PLACEMENT  09/16/2021   Procedure: HEMOSTASIS CLIP PLACEMENT;  Surgeon: Gatha Mayer, MD;  Location: [place];  Service: Gastroenterology;;   HOT HEMOSTASIS N/A 09/16/2021   Procedure: HOT HEMOSTASIS (ARGON PLASMA COAGULATION/BICAP);  Surgeon: Gatha Mayer, MD;  Location: [place];  Service: Gastroenterology;  Laterality: N/A;   HOT HEMOSTASIS  10/11/2021   Procedure: HOT HEMOSTASIS (ARGON PLASMA COAGULATION/BICAP);  Surgeon: Montez Morita, Quillian Quince, MD;  [facility];  Service: Gastroenterology;;   INSERTION OF ILIAC STENT Right 08/20/2020   Procedure: RETROGRADE INSERTION OF RIGHT ILIAC STENT;  Surgeon: Marty Heck, MD;  Location: [place];  Service: Vascular;  Laterality: Right;   INTRAOPERATIVE ARTERIOGRAM Right 08/20/2020   Procedure: INTRA OPERATIVE ARTERIOGRAM ILIAC;  Surgeon: Marty Heck, MD;  Location: [place];  Service: Vascular;  Laterality: Right;   PATCH ANGIOPLASTY Right 08/20/2020   Procedure: PATCH ANGIOPLASTY RIGHT FEMORAL ARTERY;  Surgeon: Marty Heck, MD;  Location: [place];  Service: Vascular;  Laterality: Right;   PERIPHERAL VASCULAR BALLOON ANGIOPLASTY Right 05/30/2021   Procedure: PERIPHERAL VASCULAR BALLOON ANGIOPLASTY;  Surgeon: Marty Heck, MD;  Location: [place] [facility];  Service: Cardiovascular;  Laterality: Right;    PORTACATH PLACEMENT Left 06/13/2016   Procedure: INSERTION PORT-A-CATH;  Surgeon: Aviva Signs, MD;  [facility];  Service: General;  Laterality: Left;   TRANSURETHRAL RESECTION OF PROSTATE N/A 05/31/2020   Procedure: TRANSURETHRAL RESECTION OF THE PROSTATE (TURP);  Surgeon: Cleon Gustin, MD;  [facility];  Service: Urology;  Laterality: N/A;   VIDEO BRONCHOSCOPY WITH ENDOBRONCHIAL NAVIGATION N/A 05/28/2016   Procedure: VIDEO BRONCHOSCOPY WITH ENDOBRONCHIAL NAVIGATION;  Surgeon: [name], MD;  Location: [place];  Service: Thoracic;  Laterality: N/A;   VIDEO BRONCHOSCOPY WITH ENDOBRONCHIAL ULTRASOUND N/A 05/28/2016   Procedure: VIDEO BRONCHOSCOPY WITH ENDOBRONCHIAL ULTRASOUND;  Surgeon: [name], MD;  [facility];  Service: Thoracic;  Laterality: N/A;    Current Medications: Current Meds  Medication Sig   apixaban (ELIQUIS) 5 MG TABS tablet Take 1 tablet (5 mg total) by mouth 2 (two) times daily.   Blood Glucose Monitoring Suppl (ONETOUCH VERIO REFLECT) w/Device KIT Use to test blood sugars daily as directed. DX: E11.9   [employer] IRON 325 (65 Fe) MG tablet TAKE 1 TABLET (325MG) TWICE DAILY   Cyanocobalamin (B-12 PO) Take 1 tablet by mouth daily.   dutasteride (AVODART) 0.5 MG capsule Take 1 capsule (0.5 mg total) by mouth daily.   famotidine (PEPCID) 20 MG tablet One after supper (Patient taking differently: Take 20  mg by mouth daily.)   fluticasone (FLONASE) 50 MCG/ACT nasal spray PLACE 1 SPRAY INTO BOTH NOSTRILS DAILY AS NEEDED FOR ALLERGIES OR RHINITIS.   Fluticasone-Umeclidin-Vilant (TRELEGY ELLIPTA) 100-62.5-25 MCG/ACT AEPB Inhale 1 puff into the lungs daily.   gabapentin (NEURONTIN) 300 MG capsule TAKE 1 CAPSULE BY MOUTH THREE TIMES A DAY   KLOR-CON M20 20 MEQ tablet TAKE 1 TABLET BY MOUTH EVERY DAY   levothyroxine (SYNTHROID) 50 MCG tablet TAKE 1 TABLET BY MOUTH EVERY DAY (Patient taking differently: Take 50 mcg by mouth daily before breakfast.)    linaclotide (LINZESS) 72 MCG capsule Take 1 capsule (72 mcg total) by mouth daily as needed (constipation).   Melatonin 10 MG TABS Take 10 mg by mouth at bedtime as needed (sleep).   NON FORMULARY CPAP at bedtime   ondansetron (ZOFRAN) 4 MG tablet Take 1 tablet (4 mg total) by mouth every 8 (eight) hours as needed for nausea or vomiting.   OneTouch Delica Lancets 70W MISC Use to test blood sugars daily as directed. DX: E11.9   ONETOUCH VERIO test strip USE TO TEST BLOOD SUGARS DAILY AS DIRECTED. DX: E11.9   OXYGEN Inhale 2 L into the lungs daily as needed.   pantoprazole (PROTONIX) 40 MG tablet Take 1 tablet (40 mg total) by mouth 2 (two) times daily before a meal.   pravastatin (PRAVACHOL) 40 MG tablet TAKE 1 TABLET (40 MG TOTAL) BY MOUTH ON MONDAYS , WEDNESDAYS, AND FRIDAYS AS DIRECTED (Patient taking differently: Take 40 mg by mouth 3 (three) times a week. Mon, wed, and fri)   Semaglutide (RYBELSUS) 7 MG TABS Take 7 mg by mouth daily.   spironolactone (ALDACTONE) 25 MG tablet Take 1 tablet (25 mg total) by mouth daily.   torsemide (DEMADEX) 20 MG tablet Take 2 tablets (40 mg) in the AM.   zolpidem (AMBIEN) 5 MG tablet Take 1 tablet (5 mg total) by mouth at bedtime as needed for sleep.     Allergies:   Sulfa antibiotics, Atorvastatin, Jardiance [empagliflozin], Lopressor [metoprolol], Rosuvastatin, Doxycycline, and Temazepam   Social History   Socioeconomic History   Marital status: Soil scientist    Spouse name: Not on file   Number of children: 5   Years of education: 10   Highest education level: 10th grade  Occupational History   Occupation: Retired  Tobacco Use   Smoking status: Every Day    Packs/day: 0.50    Years: 55.00    Total pack years: 27.50    Types: Cigarettes    Start date: 03/11/1961   Smokeless tobacco: Never   Tobacco comments:    1/2 pack to 1 pack per day 12/14/19  Vaping Use   Vaping Use: Never used  Substance and Sexual Activity   Alcohol use: No    Drug use: No   Sexual activity: Yes    Birth control/protection: None  Other Topics Concern   Not on file  Social History Narrative   [name] is his POA and lives with him. Children out of town - one in [facility], others in [place].   Social Determinants of Health   Financial Resource Strain: Low Risk  (11/15/2021)   Overall Financial Resource Strain (CARDIA)    Difficulty of Paying Living Expenses: Not hard at all  Food Insecurity: No Food Insecurity (11/15/2021)   Hunger Vital Sign    Worried About Running Out of Food in the Last Year: Never true    Ran Out of Food in the  Last Year: Never true  Transportation Needs: No Transportation Needs (11/15/2021)   PRAPARE - Hydrologist (Medical): No    Lack of Transportation (Non-Medical): No  Physical Activity: Inactive (11/15/2021)   Exercise Vital Sign    Days of Exercise per Week: 0 days    Minutes of Exercise per Session: 0 min  Stress: No Stress Concern Present (11/15/2021)   [place]    Feeling of Stress : Only a little  Social Connections: Moderately Isolated (11/15/2021)   Social Connection and Isolation Panel [NHANES]    Frequency of Communication with Friends and Family: More than three times a week    Frequency of Social Gatherings with Friends and Family: Once a week    Attends Religious Services: Never    Marine scientist or Organizations: No    Attends Music therapist: Never    Marital Status: Living with partner     Family History: The patient's family history includes Diabetes in his father; Heart disease in his father; Hypertension in his mother and sister; Stroke in his father.  ROS:   Please see the history of present illness.    (+) Constipation All other systems reviewed and are negative.  EKGs/Labs/Other Studies Reviewed:    The following studies were reviewed today:  September 15, 2021 echo EF  55% RV normal Mildly dilated left atrium No MR   EKGs for the past year were personally reviewed and showed no evidence of atrial fibrillation.  There is only atrial flutter that is typical appearing.   EKG:   EKG is personally reviewed.  12/16/2021:  EKG was not ordered.   Recent Labs: 09/16/2021: Magnesium 2.2 10/10/2021: B Natriuretic Peptide 191.0 10/14/2021: Hemoglobin 9.1; Platelets 326 11/25/2021: ALT 20; BUN 46; Creatinine, Ser 1.24; Potassium 4.1; Sodium 134; TSH 1.960   Recent Lipid Panel    Component Value Date/Time   CHOL 126 08/21/2020 0530   CHOL 136 04/25/2019 1608   TRIG 89 08/21/2020 0530   HDL 28 (L) 08/21/2020 0530   HDL 28 (L) 04/25/2019 1608   CHOLHDL 4.5 08/21/2020 0530   VLDL 18 08/21/2020 0530   LDLCALC 80 08/21/2020 0530   LDLCALC 78 04/25/2019 1608    Physical Exam:    VS:  BP 118/72   Pulse 73   Ht 5' 6"  (1.676 m)   Wt 145 lb (65.8 kg)   SpO2 96%   BMI 23.40 kg/m     Wt Readings from Last 3 Encounters:  12/16/21 145 lb (65.8 kg)  12/12/21 148 lb (67.1 kg)  12/10/21 144 lb (65.3 kg)     GEN: Well nourished, well developed in no acute distress.  Elderly HEENT: Normal NECK: No JVD; No carotid bruits LYMPHATICS: No lymphadenopathy CARDIAC: RRR, no murmurs, rubs, gallops RESPIRATORY:  Clear to auscultation without rales, wheezing or rhonchi  ABDOMEN: Soft, non-tender, non-distended MUSCULOSKELETAL:  No edema; No deformity  SKIN: Warm and dry.  Scattered lesions on his lower extremities and hands without signs of infection. NEUROLOGIC:  Alert and oriented x 3 PSYCHIATRIC:  Normal affect       ASSESSMENT:    1. Typical atrial flutter ([place])   2. Chronic diastolic heart failure ([place])   3. Primary hypertension    PLAN:    In order of problems listed above:  #Typical atrial flutter No evidence of AF thus far.   Discussed treatment options today for his AFL  including antiarrhythmic drug therapy and ablation. Discussed risks,  recovery and likelihood of success. Discussed potential need for repeat ablation procedures and antiarrhythmic drugs after an initial ablation. They wish to proceed with scheduling. Discussed the possibility of developing AF after a AFL ablation.   Risk, benefits, and alternatives to EP study and radiofrequency ablation for AFL were also discussed in detail today. These risks include but are not limited to stroke, bleeding, vascular damage, tamponade, perforation, damage to the esophagus, lungs, and other structures, worsening renal function, and death. The patient understands these risk and wishes to proceed.  We will therefore proceed with catheter ablation at the next available time.  Carto, ICE, anesthesia are requested for the procedure.     Plan for ILR implant 3 months after CTI ablation. If no recurrence on loop recorder monitoring, can plan for anticoagulation discontinuation.  If he were to develop recurrence of atrial flutter or develop atrial fibrillation post CTI ablation, will discuss left atrial appendage occlusion.    Medication Adjustments/Labs and Tests Ordered: Current medicines are reviewed at length with the patient today.  Concerns regarding medicines are outlined above.  No orders of the defined types were placed in this encounter.  No orders of the defined types were placed in this encounter.   I,Mathew Stumpf,acting as a Education administrator for Vickie Epley, MD.,have documented all relevant documentation on the behalf of Vickie Epley, MD,as directed by  Vickie Epley, MD while in the presence of Vickie Epley, MD.  I, Vickie Epley, MD, have reviewed all documentation for this visit. The documentation on 12/16/21 for the exam, diagnosis, procedures, and orders are all accurate and complete.   Signed, Hilton Cork. Quentin Ore, MD, [facility], [facility] 12/16/2021 2:03 PM    Electrophysiology  [facility]

## 2021-12-16 NOTE — Patient Instructions (Signed)
Medication Instructions:  none *If you need a refill on your cardiac medications before your next appointment, please call your pharmacy*   Lab Work: CBC, BMP  If you have labs (blood work) drawn today and your tests are completely normal, you will receive your results only by: [place] (if you have MyChart) OR A paper copy in the mail If you have any lab test that is abnormal or we need to change your treatment, we will call you to review the results.   Testing/Procedures: Your physician has recommended that you have an ablation. Catheter ablation is a medical procedure used to treat some cardiac arrhythmias (irregular heartbeats). During catheter ablation, a long, thin, flexible tube is put into a blood vessel in your groin (upper thigh), or neck. This tube is called an ablation catheter. It is then guided to your heart through the blood vessel. Radio frequency waves destroy small areas of heart tissue where abnormal heartbeats may cause an arrhythmia to start. Please see the instruction sheet given to you today.   Follow-Up: At [facility], you and your health needs are our priority.  As part of our continuing mission to provide you with exceptional heart care, we have created designated Provider Care Teams.  These Care Teams include your primary Cardiologist (physician) and Advanced Practice Providers (APPs -  Physician Assistants and Nurse Practitioners) who all work together to provide you with the care you need, when you need it.  We recommend signing up for the patient portal called "MyChart".  Sign up information is provided on this After Visit Summary.  MyChart is used to connect with patients for Virtual Visits (Telemedicine).  Patients are able to view lab/test results, encounter notes, upcoming appointments, etc.  Non-urgent messages can be sent to your provider as well.   To learn more about what you can do with MyChart, go to [email or web address].    Your next  appointment:   See instruction letter.  Important Information About Sugar

## 2021-12-16 NOTE — Progress Notes (Deleted)
Electrophysiology Office Note:    Date:  12/16/2021   ID:  John [name], DOB [birth year], MRN [id number]  PCP:  [name], [facility] Cardiologist:  Evalina Field, MD  [facility] Electrophysiologist:  Vickie Epley, MD   Referring MD: Geralynn Rile, *   Chief Complaint: Atrial flutter  History of Present Illness:    John [name] is a 76 y.o. male who presents for an evaluation of atrial flutter at the request of Dr. Audie Box. Their medical history includes atrial flutter, GI bleeding, non-small cell lung cancer post chemo and radiation in 2018, COPD, peripheral arterial disease, chronic diastolic heart failure.  The patient was seen by Dr. Audie Box December 12, 2021.  His diagnosis of atrial flutter dates back to May 2022.  The patient has significant GI bleeding history.  He has had multiple AVMs diagnosed this year.  He had radiation gastritis in June of this year.     Past Medical History:  Diagnosis Date   Adenocarcinoma of lung, right ([place]) 04/18/2016   Anemia    Anxiety    Arthritis    Asthma    Atrial flutter (HCC)    BPH (benign prostatic hyperplasia)    with urinary retention 02/06/20   CHF (congestive heart failure) (HCC)    COPD (chronic obstructive pulmonary disease) ([place])    Depression    Diabetes mellitus, type II ([place])    Dyspnea    History of kidney stones    History of radiation therapy    right lung 09/10/2020-09/17/2020   Dr Sondra Come   Hyperlipidemia    Hypertension    Hypothyroidism    Macular degeneration    Neuropathy    Non-small cell lung cancer, right ([place]) 04/18/2016   Peripheral vascular disease ([place])    Prostatitis    Pulmonary nodule, left 07/16/2016   Sleep apnea    cpap    Past Surgical History:  Procedure Laterality Date   ABDOMINAL AORTOGRAM W/LOWER EXTREMITY Left 02/06/2020   Procedure: ABDOMINAL AORTOGRAM W/LOWER EXTREMITY;  Surgeon: Lorretta Harp, MD;  Location: [place] [facility];  Service:  Cardiovascular;  Laterality: Left;   ABDOMINAL AORTOGRAM W/LOWER EXTREMITY N/A 05/30/2021   Procedure: ABDOMINAL AORTOGRAM W/LOWER EXTREMITY;  Surgeon: Marty Heck, MD;  Location: [place] [facility];  Service: Cardiovascular;  Laterality: N/A;   BIOPSY  09/16/2021   Procedure: BIOPSY;  Surgeon: Gatha Mayer, MD;  Location: [place];  Service: Gastroenterology;;   CARDIOVERSION N/A 10/05/2020   Procedure: CARDIOVERSION;  Surgeon: Geralynn Rile, MD;  Location: [place];  Service: Cardiovascular;  Laterality: N/A;   CATARACT EXTRACTION, BILATERAL Bilateral    COLONOSCOPY WITH PROPOFOL N/A 06/20/2021   Procedure: COLONOSCOPY WITH PROPOFOL;  Surgeon: Irene Shipper, MD;  [facility];  Service: Endoscopy;  Laterality: N/A;   COLONOSCOPY WITH PROPOFOL N/A 09/16/2021   Procedure: COLONOSCOPY WITH PROPOFOL;  Surgeon: Gatha Mayer, MD;  Location: [place];  Service: Gastroenterology;  Laterality: N/A;   ENDARTERECTOMY FEMORAL Right 08/20/2020   Procedure: ENDARTERECTOMY  RIGHT FEMORAL ARTERY;  Surgeon: Marty Heck, MD;  Location: [place];  Service: Vascular;  Laterality: Right;   ENTEROSCOPY N/A 10/11/2021   Procedure: ENTEROSCOPY;  Surgeon: Harvel Quale, MD;  [facility];  Service: Gastroenterology;  Laterality: N/A;   ESOPHAGOGASTRODUODENOSCOPY N/A 09/16/2021   Procedure: ESOPHAGOGASTRODUODENOSCOPY (EGD);  Surgeon: Gatha Mayer, MD;  Location: [place];  Service: Gastroenterology;  Laterality: N/A;  ESOPHAGOGASTRODUODENOSCOPY (EGD) WITH PROPOFOL N/A 09/06/2020   Procedure: ESOPHAGOGASTRODUODENOSCOPY (EGD) WITH PROPOFOL;  Surgeon: Irene Shipper, MD;  Location: [facility];  Service: Endoscopy;  Laterality: N/A;   ESOPHAGOGASTRODUODENOSCOPY (EGD) WITH PROPOFOL N/A 10/11/2021   Procedure: ESOPHAGOGASTRODUODENOSCOPY (EGD) WITH PROPOFOL;  Surgeon: Harvel Quale, MD;  [facility];  Service: Gastroenterology;   Laterality: N/A;   HEMOSTASIS CLIP PLACEMENT  09/16/2021   Procedure: HEMOSTASIS CLIP PLACEMENT;  Surgeon: Gatha Mayer, MD;  Location: [place];  Service: Gastroenterology;;   HOT HEMOSTASIS N/A 09/16/2021   Procedure: HOT HEMOSTASIS (ARGON PLASMA COAGULATION/BICAP);  Surgeon: Gatha Mayer, MD;  Location: [place];  Service: Gastroenterology;  Laterality: N/A;   HOT HEMOSTASIS  10/11/2021   Procedure: HOT HEMOSTASIS (ARGON PLASMA COAGULATION/BICAP);  Surgeon: Montez Morita, Quillian Quince, MD;  [facility];  Service: Gastroenterology;;   INSERTION OF ILIAC STENT Right 08/20/2020   Procedure: RETROGRADE INSERTION OF RIGHT ILIAC STENT;  Surgeon: Marty Heck, MD;  Location: [place];  Service: Vascular;  Laterality: Right;   INTRAOPERATIVE ARTERIOGRAM Right 08/20/2020   Procedure: INTRA OPERATIVE ARTERIOGRAM ILIAC;  Surgeon: Marty Heck, MD;  Location: [place];  Service: Vascular;  Laterality: Right;   PATCH ANGIOPLASTY Right 08/20/2020   Procedure: PATCH ANGIOPLASTY RIGHT FEMORAL ARTERY;  Surgeon: Marty Heck, MD;  Location: [place];  Service: Vascular;  Laterality: Right;   PERIPHERAL VASCULAR BALLOON ANGIOPLASTY Right 05/30/2021   Procedure: PERIPHERAL VASCULAR BALLOON ANGIOPLASTY;  Surgeon: Marty Heck, MD;  Location: [place] [facility];  Service: Cardiovascular;  Laterality: Right;   PORTACATH PLACEMENT Left 06/13/2016   Procedure: INSERTION PORT-A-CATH;  Surgeon: Aviva Signs, MD;  [facility];  Service: General;  Laterality: Left;   TRANSURETHRAL RESECTION OF PROSTATE N/A 05/31/2020   Procedure: TRANSURETHRAL RESECTION OF THE PROSTATE (TURP);  Surgeon: Cleon Gustin, MD;  [facility];  Service: Urology;  Laterality: N/A;   VIDEO BRONCHOSCOPY WITH ENDOBRONCHIAL NAVIGATION N/A 05/28/2016   Procedure: VIDEO BRONCHOSCOPY WITH ENDOBRONCHIAL NAVIGATION;  Surgeon: [name], MD;  Location: [place];  Service: Thoracic;  Laterality: N/A;    VIDEO BRONCHOSCOPY WITH ENDOBRONCHIAL ULTRASOUND N/A 05/28/2016   Procedure: VIDEO BRONCHOSCOPY WITH ENDOBRONCHIAL ULTRASOUND;  Surgeon: [name], MD;  Location: [place];  Service: Thoracic;  Laterality: N/A;    Current Medications: No outpatient medications have been marked as taking for the 12/16/21 encounter (Appointment) with Vickie Epley, MD.     Allergies:   Sulfa antibiotics, Atorvastatin, Jardiance [empagliflozin], Lopressor [metoprolol], Rosuvastatin, Doxycycline, and Temazepam   Social History   Socioeconomic History   Marital status: Soil scientist    Spouse name: Not on file   Number of children: 5   Years of education: 10   Highest education level: 10th grade  Occupational History   Occupation: Retired  Tobacco Use   Smoking status: Every Day    Packs/day: 0.50    Years: 55.00    Total pack years: 27.50    Types: Cigarettes    Start date: 03/11/1961   Smokeless tobacco: Never   Tobacco comments:    1/2 pack to 1 pack per day 12/14/19  Vaping Use   Vaping Use: Never used  Substance and Sexual Activity   Alcohol use: No   Drug use: No   Sexual activity: Yes    Birth control/protection: None  Other Topics Concern   Not on file  Social History Narrative   [name] is his POA and lives with  him. Children out of town - one in [facility], others in [place].   Social Determinants of Health   Financial Resource Strain: Low Risk  (11/15/2021)   Overall Financial Resource Strain (CARDIA)    Difficulty of Paying Living Expenses: Not hard at all  Food Insecurity: No Food Insecurity (11/15/2021)   Hunger Vital Sign    Worried About Running Out of Food in the Last Year: Never true    Ran Out of Food in the Last Year: Never true  Transportation Needs: No Transportation Needs (11/15/2021)   PRAPARE - Hydrologist (Medical): No    Lack of Transportation (Non-Medical): No  Physical Activity: Inactive (11/15/2021)   Exercise  Vital Sign    Days of Exercise per Week: 0 days    Minutes of Exercise per Session: 0 min  Stress: No Stress Concern Present (11/15/2021)   [place]    Feeling of Stress : Only a little  Social Connections: Moderately Isolated (11/15/2021)   Social Connection and Isolation Panel [NHANES]    Frequency of Communication with Friends and Family: More than three times a week    Frequency of Social Gatherings with Friends and Family: Once a week    Attends Religious Services: Never    Marine scientist or Organizations: No    Attends Music therapist: Never    Marital Status: Living with partner     Family History: The patient's family history includes Diabetes in his father; Heart disease in his father; Hypertension in his mother and sister; Stroke in his father.  ROS:   Please see the history of present illness.    All other systems reviewed and are negative.  EKGs/Labs/Other Studies Reviewed:    The following studies were reviewed today:  September 15, 2021 echo EF 55% RV normal Mildly dilated left atrium No MR  EKGs for the past year were personally reviewed and showed no evidence of atrial fibrillation.  There is only atrial flutter that is typical appearing.  Recent Labs: 09/16/2021: Magnesium 2.2 10/10/2021: B Natriuretic Peptide 191.0 10/14/2021: Hemoglobin 9.1; Platelets 326 11/25/2021: ALT 20; BUN 46; Creatinine, Ser 1.24; Potassium 4.1; Sodium 134; TSH 1.960  Recent Lipid Panel    Component Value Date/Time   CHOL 126 08/21/2020 0530   CHOL 136 04/25/2019 1608   TRIG 89 08/21/2020 0530   HDL 28 (L) 08/21/2020 0530   HDL 28 (L) 04/25/2019 1608   CHOLHDL 4.5 08/21/2020 0530   VLDL 18 08/21/2020 0530   LDLCALC 80 08/21/2020 0530   LDLCALC 78 04/25/2019 1608    Physical Exam:    VS:  There were no vitals taken for this visit.    Wt Readings from Last 3 Encounters:  12/12/21 148 lb  (67.1 kg)  12/10/21 144 lb (65.3 kg)  11/27/21 147 lb (66.7 kg)     GEN: *** Well nourished, well developed in no acute distress HEENT: Normal NECK: No JVD; No carotid bruits LYMPHATICS: No lymphadenopathy CARDIAC: ***RRR, no murmurs, rubs, gallops RESPIRATORY:  Clear to auscultation without rales, wheezing or rhonchi  ABDOMEN: Soft, non-tender, non-distended MUSCULOSKELETAL:  No edema; No deformity  SKIN: Warm and dry NEUROLOGIC:  Alert and oriented x 3 PSYCHIATRIC:  Normal affect       ASSESSMENT:    1. Typical atrial flutter ([place])   2. Chronic diastolic heart failure ([place])   3. Primary hypertension    PLAN:  In order of problems listed above:  #Typical atrial flutter No evidence of AF thus far.  Discussed treatment options today for his AFL including antiarrhythmic drug therapy and ablation. Discussed risks, recovery and likelihood of success. Discussed potential need for repeat ablation procedures and antiarrhythmic drugs after an initial ablation. They wish to proceed with scheduling. Discussed the possibility of developing AF after a AFL ablation.  Risk, benefits, and alternatives to EP study and radiofrequency ablation for AFL were also discussed in detail today. These risks include but are not limited to stroke, bleeding, vascular damage, tamponade, perforation, damage to the esophagus, lungs, and other structures, worsening renal function, and death. The patient understands these risk and wishes to proceed.  We will therefore proceed with catheter ablation at the next available time.  Carto, ICE, anesthesia are requested for the procedure.     Plan for ILR implant 3 months after CTI ablation. If no recurrence on loop recorder monitoring, can plan for anticoagulation discontinuation.  If he were to develop recurrence of atrial flutter or develop atrial fibrillation post CTI ablation, will discuss left atrial appendage occlusion.    Total time spent with patient  today *** minutes. This includes reviewing records, evaluating the patient and coordinating care.  Medication Adjustments/Labs and Tests Ordered: Current medicines are reviewed at length with the patient today.  Concerns regarding medicines are outlined above.  No orders of the defined types were placed in this encounter.  No orders of the defined types were placed in this encounter.    Signed, Hilton Cork. Quentin Ore, MD, [facility], [facility] 12/16/2021 5:22 AM    Electrophysiology [facility]

## 2021-12-17 DIAGNOSIS — K922 Gastrointestinal hemorrhage, unspecified: Secondary | ICD-10-CM | POA: Diagnosis not present

## 2021-12-17 DIAGNOSIS — I152 Hypertension secondary to endocrine disorders: Secondary | ICD-10-CM | POA: Diagnosis not present

## 2021-12-17 DIAGNOSIS — E1159 Type 2 diabetes mellitus with other circulatory complications: Secondary | ICD-10-CM | POA: Diagnosis not present

## 2021-12-17 DIAGNOSIS — D62 Acute posthemorrhagic anemia: Secondary | ICD-10-CM | POA: Diagnosis not present

## 2021-12-17 DIAGNOSIS — J449 Chronic obstructive pulmonary disease, unspecified: Secondary | ICD-10-CM | POA: Diagnosis not present

## 2021-12-17 DIAGNOSIS — I5033 Acute on chronic diastolic (congestive) heart failure: Secondary | ICD-10-CM | POA: Diagnosis not present

## 2021-12-17 LAB — BASIC METABOLIC PANEL
BUN/Creatinine Ratio: 36 — ABNORMAL HIGH (ref 10–24)
BUN: 42 mg/dL — ABNORMAL HIGH (ref 8–27)
CO2: 26 mmol/L (ref 20–29)
Calcium: 10 mg/dL (ref 8.6–10.2)
Chloride: 94 mmol/L — ABNORMAL LOW (ref 96–106)
Creatinine, Ser: 1.18 mg/dL (ref 0.76–1.27)
Glucose: 159 mg/dL — ABNORMAL HIGH (ref 70–99)
Potassium: 4.5 mmol/L (ref 3.5–5.2)
Sodium: 139 mmol/L (ref 134–144)
eGFR: 64 mL/min/{1.73_m2} (ref >59)

## 2021-12-17 LAB — CBC WITH DIFFERENTIAL/PLATELET
Basophils Absolute: 0.1 10*3/uL (ref 0.0–0.2)
Basos: 1 %
EOS (ABSOLUTE): 0.2 10*3/uL (ref 0.0–0.4)
Eos: 2 %
Hematocrit: 46.6 % (ref 37.5–51.0)
Hemoglobin: 15.1 g/dL (ref 13.0–17.7)
Immature Grans (Abs): 0 10*3/uL (ref 0.0–0.1)
Immature Granulocytes: 0 %
Lymphocytes Absolute: 0.8 10*3/uL (ref 0.7–3.1)
Lymphs: 9 %
MCH: 30.9 pg (ref 26.6–33.0)
MCHC: 32.4 g/dL (ref 31.5–35.7)
MCV: 96 fL (ref 79–97)
Monocytes Absolute: 1.1 10*3/uL — ABNORMAL HIGH (ref 0.1–0.9)
Monocytes: 13 %
Neutrophils Absolute: 6.7 10*3/uL (ref 1.4–7.0)
Neutrophils: 75 %
Platelets: 367 10*3/uL (ref 150–450)
RBC: 4.88 x10E6/uL (ref 4.14–5.80)
RDW: 13.8 % (ref 11.6–15.4)
WBC: 8.8 10*3/uL (ref 3.4–10.8)

## 2021-12-18 ENCOUNTER — Ambulatory Visit: Payer: Medicare Other | Admitting: Family Medicine

## 2021-12-18 ENCOUNTER — Ambulatory Visit (INDEPENDENT_AMBULATORY_CARE_PROVIDER_SITE_OTHER): Payer: Medicare Other | Admitting: Family Medicine

## 2021-12-18 ENCOUNTER — Encounter: Payer: Self-pay | Admitting: Family Medicine

## 2021-12-18 ENCOUNTER — Ambulatory Visit (INDEPENDENT_AMBULATORY_CARE_PROVIDER_SITE_OTHER): Payer: Medicare Other

## 2021-12-18 VITALS — BP 127/64 | HR 88 | Temp 97.4°F | Resp 20 | Ht 66.0 in | Wt 147.0 lb

## 2021-12-18 DIAGNOSIS — S61451A Open bite of right hand, initial encounter: Secondary | ICD-10-CM

## 2021-12-18 DIAGNOSIS — W540XXA Bitten by dog, initial encounter: Secondary | ICD-10-CM

## 2021-12-18 DIAGNOSIS — M7989 Other specified soft tissue disorders: Secondary | ICD-10-CM | POA: Diagnosis not present

## 2021-12-18 MED ORDER — AMOXICILLIN-POT CLAVULANATE 875-125 MG PO TABS: 1.0000 | ORAL_TABLET | Freq: Two times a day (BID) | ORAL | 0 refills | Status: DC

## 2021-12-18 MED ORDER — HYDROCODONE-ACETAMINOPHEN 5-325 MG PO TABS: 0.5000 | ORAL_TABLET | Freq: Three times a day (TID) | ORAL | 0 refills | Status: DC | PRN

## 2021-12-18 MED ORDER — CEFTRIAXONE SODIUM 1 G IJ SOLR
1.0000 g | Freq: Once | INTRAMUSCULAR | Status: AC
  Administered 2021-12-18: 1 g via INTRAMUSCULAR

## 2021-12-18 NOTE — Progress Notes (Signed)
Subjective: CC:? Gout PCP: [name], FNP RCV:ELFYBO D John [name] is a 76 y.o. male presenting to clinic today for:  1.  Hand pain Patient reports hand pain in the right hand over the last couple of weeks.  Its been gradually getting worse.  He admits that he did sustain a couple of bites by his dog in that right hand.  He did not seek care after that because he just put Neosporin on it and thought it was fine.  He is right-hand dominant.  He reports difficulty making a fist secondary to the pain and swelling.  He has not been utilizing any medications because he is anticoagulated and was not sure what he could use.  No fevers, chills reported.   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   Anemia    Anxiety    Arthritis    Asthma    Atrial flutter (HCC)    BPH (benign prostatic hyperplasia)    with urinary retention 02/06/20   CHF (congestive heart failure) (HCC)    COPD (chronic obstructive pulmonary disease) ([place])    Depression    Diabetes mellitus, type II ([place])    Dyspnea    History of kidney stones    History of radiation therapy    right lung 09/10/2020-09/17/2020   Dr Sondra Come   Hyperlipidemia    Hypertension    Hypothyroidism    Macular degeneration    Neuropathy    Non-small cell lung cancer, right ([place]) 04/18/2016   Peripheral vascular disease ([place])    Prostatitis    Pulmonary nodule, left 07/16/2016   Sleep apnea    cpap     Current Outpatient Medications:    apixaban (ELIQUIS) 5 MG TABS tablet, Take 1 tablet (5 mg total) by mouth 2 (two) times daily., Disp: 60 tablet, Rfl: 2   Blood Glucose Monitoring Suppl (ONETOUCH VERIO REFLECT) w/Device KIT, Use to test blood sugars daily as directed. DX: E11.9, Disp: 1 kit, Rfl: 1   [employer] IRON 325 (65 Fe) MG tablet, TAKE 1 TABLET (325MG) TWICE DAILY, Disp: 200 tablet, Rfl: 1   Cyanocobalamin (B-12 PO), Take 1 tablet by mouth daily., Disp: , Rfl:    dutasteride (AVODART) 0.5 MG capsule, Take 1 capsule (0.5 mg total) by mouth daily., Disp: 90 capsule, Rfl: 3   famotidine (PEPCID) 20 MG tablet, One after supper (Patient taking differently: Take 20 mg by mouth daily.), Disp: 30 tablet, Rfl: 11   fluticasone (FLONASE) 50 MCG/ACT nasal spray, PLACE 1 SPRAY INTO BOTH NOSTRILS DAILY AS NEEDED FOR ALLERGIES OR RHINITIS., Disp: 48 mL, Rfl: 3   Fluticasone-Umeclidin-Vilant (TRELEGY ELLIPTA) 100-62.5-25 MCG/ACT AEPB, Inhale 1 puff into the lungs daily., Disp: 1 each, Rfl: 11   gabapentin (NEURONTIN) 300 MG capsule, TAKE 1 CAPSULE BY MOUTH THREE TIMES A DAY, Disp: 270 capsule, Rfl: 0   KLOR-CON M20 20 MEQ tablet, TAKE  1 TABLET BY MOUTH EVERY DAY, Disp: 90 tablet, Rfl: 0   levothyroxine (SYNTHROID) 50 MCG tablet, TAKE 1 TABLET BY MOUTH EVERY DAY (Patient taking differently: Take 50 mcg by mouth daily before breakfast.), Disp: 90 tablet, Rfl: 1   linaclotide (LINZESS) 72 MCG capsule, Take 1 capsule (72 mcg total) by mouth daily as needed (constipation)., Disp: , Rfl:    Melatonin 10 MG TABS, Take 10 mg by mouth at bedtime as needed (sleep)., Disp: , Rfl:    NON FORMULARY, CPAP at bedtime, Disp: , Rfl:    ondansetron (ZOFRAN) 4 MG tablet, Take 1 tablet (4 mg total) by mouth every 8 (eight) hours as needed for nausea or vomiting., Disp: 60 tablet, Rfl: 0   OneTouch Delica Lancets 24M MISC, Use to test blood sugars daily as directed. DX: E11.9, Disp: 100 each, Rfl: 1   ONETOUCH VERIO test  strip, USE TO TEST BLOOD SUGARS DAILY AS DIRECTED. DX: E11.9, Disp: 100 strip, Rfl: 12   OXYGEN, Inhale 2 L into the lungs daily as needed., Disp: , Rfl:    pantoprazole (PROTONIX) 40 MG tablet, Take 1 tablet (40 mg total) by mouth 2 (two) times daily before a meal., Disp: 60 tablet, Rfl: 5   pravastatin (PRAVACHOL) 40 MG tablet, TAKE 1 TABLET (40 MG TOTAL) BY MOUTH ON MONDAYS , WEDNESDAYS, AND FRIDAYS AS DIRECTED (Patient taking differently: Take 40 mg by mouth 3 (three) times a week. Mon, wed, and fri), Disp: 48 tablet, Rfl: 0   Semaglutide (RYBELSUS) 7 MG TABS, Take 7 mg by mouth daily., Disp: 90 tablet, Rfl: 1   spironolactone (ALDACTONE) 25 MG tablet, Take 1 tablet (25 mg total) by mouth daily., Disp: 90 tablet, Rfl: 3   torsemide (DEMADEX) 20 MG tablet, Take 2 tablets (40 mg) in the AM., Disp: 180 tablet, Rfl: 2   zolpidem (AMBIEN) 5 MG tablet, Take 1 tablet (5 mg total) by mouth at bedtime as needed for sleep., Disp: 30 tablet, Rfl: 2 Social History   Socioeconomic History   Marital status: Soil scientist    Spouse name: Not on file   Number of children: 5   Years of education: 10   Highest education level: 10th grade  Occupational History   Occupation: Retired  Tobacco Use   Smoking status: Every Day    Packs/day: 0.50    Years: 55.00    Total pack years: 27.50    Types: Cigarettes    Start date: 03/11/1961   Smokeless tobacco: Never   Tobacco comments:    1/2 pack to 1 pack per day 12/14/19  Vaping Use   Vaping Use: Never used  Substance and Sexual Activity   Alcohol use: No   Drug use: No   Sexual activity: Yes    Birth control/protection: None  Other Topics Concern   Not on file  Social History Narrative   [name] is his POA and lives with him. Children out of town - one in [facility], others in [place].   Social Determinants of Health   Financial Resource Strain: Low Risk  (11/15/2021)   Overall Financial Resource Strain (CARDIA)    Difficulty of Paying Living  Expenses: Not hard at all  Food Insecurity: No Food Insecurity (11/15/2021)   Hunger Vital Sign    Worried About Running Out of Food in the Last Year: Never true    Ran Out of Food in the Last Year: Never true  Transportation Needs: No Transportation Needs (11/15/2021)  PRAPARE - Hydrologist (Medical): No    Lack of Transportation (Non-Medical): No  Physical Activity: Inactive (11/15/2021)   Exercise Vital Sign    Days of Exercise per Week: 0 days    Minutes of Exercise per Session: 0 min  Stress: No Stress Concern Present (11/15/2021)   [place]    Feeling of Stress : Only a little  Social Connections: Moderately Isolated (11/15/2021)   Social Connection and Isolation Panel [NHANES]    Frequency of Communication with Friends and Family: More than three times a week    Frequency of Social Gatherings with Friends and Family: Once a week    Attends Religious Services: Never    Marine scientist or Organizations: No    Attends Archivist Meetings: Never    Marital Status: Living with partner  Intimate Partner Violence: Not At Risk (11/15/2021)   Humiliation, Afraid, Rape, and Kick questionnaire    Fear of Current or Ex-Partner: No    Emotionally Abused: No    Physically Abused: No    Sexually Abused: No   Family History  Problem Relation Age of Onset   Hypertension Mother    Diabetes Father    Heart disease Father    Stroke Father    Hypertension Sister     Objective: Office vital signs reviewed. BP 127/64   Pulse 88   Temp (!) 97.4 F (36.3 C) (Oral)   Resp 20   Ht 5' 6" (1.676 m)   Wt 147 lb (66.7 kg)   SpO2 97%   BMI 23.73 kg/m   Physical Examination:  General: Awake, alert, nontoxic male, No acute distress Right hand: Patient with soft tissue swelling noted along the dorsum of the right hand, specifically near the metacarpal phalangeal joint of the second  digit.  See below picture.  He has several puncture wounds noted.  Range of motion is somewhat limited.  There is no appreciable warmth of this area but there is quite a bit of erythema     Assessment/ Plan: 76 y.o. male   Dog bite of right hand, initial encounter - Plan: DG Hand Complete Right, amoxicillin-clavulanate (AUGMENTIN) 875-125 MG tablet, cefTRIAXone (ROCEPHIN) injection 1 g, HYDROcodone-acetaminophen (NORCO) 5-325 MG tablet  Stat imaging of that right hand ordered.  We discussed that he needs to be evaluated within 48 hours for recheck.  I am going to place him on Augmentin and also gave him a dose of Rocephin today.  Given chronic anticoagulation and diabetes I did not feel comfortable giving him Toradol nor Depo-Medrol for pain so I gave him a short course of hydrocodone.  He is on a controlled substance contract with his PCP for Ambien so I will alert her to this short course of meds.  The national narcotic database was reviewed and there were no red flags.  I cautioned the patient on sedation and dependent nature of this medicine.  He understands red flag signs and symptoms which would warrant further evaluation in the ER  No orders of the defined types were placed in this encounter.  No orders of the defined types were placed in this encounter.    Janora Norlander, DO [place] [phone]

## 2021-12-18 NOTE — Patient Instructions (Addendum)
Will call with xray results Start oral antibiotic tonight Hydrocodone sent for pain. BE CAREFUL with this medication.  It can cause falls and loopiness. If anything gets worse, SEEK CARE IN ER!!!  Animal Bite, Adult Animal bites can be mild or serious. Small bites from house pets normally are mild. Bites from cats, strays, or wild animals can be serious. If a stray or wild animal bites you, you need to get medical help right away. You may also need a shot to prevent rabies infection. What increases the risk? Being near pets you do not know. Being near animals that are eating, sleeping, or caring for their babies. Being outside where small, wild animals move freely. What are the signs or symptoms? Pain. Bleeding. Swelling. Bruising. How is this treated? Treatment may include: Cleaning your wound. Rinsing out (flushing) your wound. This uses saline solution, which is made of salt and water. Putting a bandage on your wound. Closing your wound with stitches (sutures), staples, skin glue, or skin tape (adhesive strips). Antibiotic medicine. You may be given pills, cream, gel, or fluid through an IV. A tetanus shot. Rabies treatment, if the animal could have rabies. Surgery, if there is infection or damage that needs to be fixed. Follow these instructions at home: Medicines Take or apply over-the-counter and prescription medicines only as told by your doctor. If you were prescribed an antibiotic medicine, take or apply it as told by your doctor. Do not stop using it even if your wound gets better. Wound care  Follow instructions from your doctor about how to take care of your wound. Make sure you: Wash your hands with soap and water for at least 20 seconds before and after you change your bandage. If you cannot use soap and water, use hand sanitizer. Change your bandage. Leave stitches or skin glue in place for at least 2 weeks. Leave tape strips alone unless you are told to take them  off. You may trim the edges of the tape strips if they curl up. Check your wound every day for signs of infection. Check for: More redness, swelling, or pain. More fluid or blood. Warmth. Pus or a bad smell. General instructions  Raise (elevate) the injured area above the level of your heart while you are sitting or lying down. If told, put ice on the injured area. To do this: Put ice in a plastic bag. Place a towel between your skin and the bag. Leave the ice on for 20 minutes, 2-3 times per day. Take off the ice if your skin turns bright red. This is very important. If you cannot feel pain, heat, or cold, you have a greater risk of damage to the area. Keep all follow-up visits. Contact a doctor if: You have more redness, swelling, or pain around your wound. Your wound feels warm to the touch. You have a fever or chills. You have a general feeling of sickness (malaise). You feel like you may vomit. You vomit. You have pain that does not get better. Get help right away if: You have a red streak going away from your wound. You have any of these coming from your wound: Non-clear fluid. More blood. Pus or a bad smell. You have trouble moving your injured area. You lose feeling (have numbness) or feel tingling anywhere on your body. Summary Animal bites can be mild or serious. If a stray or wild animal bites you, you need to get medical help right away. Your doctor will look at the  wound and may ask about how the animal bite happened. Treatment may include wound care, antibiotic medicine, a tetanus shot, and rabies treatment. This information is not intended to replace advice given to you by your health care provider. Make sure you discuss any questions you have with your health care provider. Document Revised: 03/29/2021 Document Reviewed: 03/29/2021 Elsevier Patient Education  [employer].

## 2021-12-20 ENCOUNTER — Ambulatory Visit (INDEPENDENT_AMBULATORY_CARE_PROVIDER_SITE_OTHER): Payer: Medicare Other | Admitting: Family

## 2021-12-20 ENCOUNTER — Encounter: Payer: Self-pay | Admitting: Family

## 2021-12-20 VITALS — BP 115/67 | HR 87 | Temp 97.6°F | Ht 66.0 in | Wt 147.2 lb

## 2021-12-20 DIAGNOSIS — L03011 Cellulitis of right finger: Secondary | ICD-10-CM

## 2021-12-20 DIAGNOSIS — E1142 Type 2 diabetes mellitus with diabetic polyneuropathy: Secondary | ICD-10-CM | POA: Diagnosis not present

## 2021-12-20 DIAGNOSIS — W540XXD Bitten by dog, subsequent encounter: Secondary | ICD-10-CM

## 2021-12-20 DIAGNOSIS — L03119 Cellulitis of unspecified part of limb: Secondary | ICD-10-CM

## 2021-12-20 NOTE — Patient Instructions (Signed)

## 2021-12-20 NOTE — Progress Notes (Signed)
   Subjective:    Patient ID: John [name], male    DOB: 11/07/1945, 76 y.o.   MRN: [id number]  Chief Complaint  Patient presents with   Wound Check   Pt presents to the office today to recheck wound of right hand. He had  a dog bite and was seen on 12/18/21 and given Rocephin 1 g and Augmentin BID for 10 days. He was also given Norco, but states he has only taken a few.   Had a negative x-ray.   Reports the redness and swelling is greatly improved.   He is a diabetic , but last A1C was 5.4.  Wound Check He was originally treated more than 14 days ago. His temperature was unmeasured prior to arrival. There has been no drainage from the wound. The redness has improved. The swelling has improved. The pain has improved.      Review of Systems  All other systems reviewed and are negative.      Objective:   Physical Exam Vitals reviewed.  Constitutional:      General: He is not in acute distress.    Appearance: He is well-developed.  HENT:     Head: Normocephalic.  Eyes:     General:        Right eye: No discharge.        Left eye: No discharge.     Pupils: Pupils are equal, round, and reactive to light.  Neck:     Thyroid: No thyromegaly.  Cardiovascular:     Rate and Rhythm: Normal rate and regular rhythm.     Heart sounds: Normal heart sounds. No murmur heard. Pulmonary:     Effort: Pulmonary effort is normal. No respiratory distress.     Breath sounds: Rhonchi present. No wheezing.  Abdominal:     General: Bowel sounds are normal. There is no distension.     Palpations: Abdomen is soft.     Tenderness: There is no abdominal tenderness.  Musculoskeletal:        General: No tenderness. Normal range of motion.     Cervical back: Normal range of motion and neck supple.  Skin:    General: Skin is warm and dry.     Findings: No erythema or rash.  Neurological:     Mental Status: He is alert and oriented to person, place, and time.     Cranial Nerves: No cranial  nerve deficit.     Deep Tendon Reflexes: Reflexes are normal and symmetric.  Psychiatric:        Behavior: Behavior normal.        Thought Content: Thought content normal.        Judgment: Judgment normal.        BP 115/67   Pulse 87   Temp 97.6 F (36.4 C) (Temporal)   Ht 5\' 6"  (1.676 m)   Wt 147 lb 3.2 oz (66.8 kg)   BMI 23.76 kg/m      Assessment & Plan:   John [name] comes in today with chief complaint of Wound Check   Diagnosis and orders addressed:  1. Dog bite, subsequent encounter  2. Cellulitis of hand  3. Type 2 diabetes mellitus with diabetic polyneuropathy, without long-term current use of insulin (HCC)   Greatly improved Continue Augmentin If redness or swelling worsen call office or go to ED     Evelina Dun, FNP

## 2021-12-24 ENCOUNTER — Inpatient Hospital Stay: Payer: Medicare Other | Attending: Hematology

## 2021-12-24 ENCOUNTER — Ambulatory Visit (HOSPITAL_COMMUNITY)
Admission: RE | Admit: 2021-12-24 | Discharge: 2021-12-24 | Disposition: A | Discharge status: Home / Self Care | Payer: Medicare Other | Source: Ambulatory Visit | Attending: Hematology | Admitting: Hematology

## 2021-12-24 VITALS — BP 116/63 | HR 89 | Temp 96.4°F | Resp 18

## 2021-12-24 DIAGNOSIS — D500 Iron deficiency anemia secondary to blood loss (chronic): Secondary | ICD-10-CM

## 2021-12-24 DIAGNOSIS — Z9221 Personal history of antineoplastic chemotherapy: Secondary | ICD-10-CM | POA: Diagnosis not present

## 2021-12-24 DIAGNOSIS — D539 Nutritional anemia, unspecified: Secondary | ICD-10-CM | POA: Diagnosis not present

## 2021-12-24 DIAGNOSIS — J432 Centrilobular emphysema: Secondary | ICD-10-CM | POA: Diagnosis not present

## 2021-12-24 DIAGNOSIS — Z79899 Other long term (current) drug therapy: Secondary | ICD-10-CM | POA: Insufficient documentation

## 2021-12-24 DIAGNOSIS — F17210 Nicotine dependence, cigarettes, uncomplicated: Secondary | ICD-10-CM | POA: Diagnosis not present

## 2021-12-24 DIAGNOSIS — R911 Solitary pulmonary nodule: Secondary | ICD-10-CM | POA: Diagnosis not present

## 2021-12-24 DIAGNOSIS — C3490 Malignant neoplasm of unspecified part of unspecified bronchus or lung: Secondary | ICD-10-CM | POA: Diagnosis not present

## 2021-12-24 DIAGNOSIS — G478 Other sleep disorders: Secondary | ICD-10-CM | POA: Insufficient documentation

## 2021-12-24 DIAGNOSIS — Z923 Personal history of irradiation: Secondary | ICD-10-CM | POA: Insufficient documentation

## 2021-12-24 DIAGNOSIS — C3491 Malignant neoplasm of unspecified part of right bronchus or lung: Secondary | ICD-10-CM | POA: Insufficient documentation

## 2021-12-24 DIAGNOSIS — Z95828 Presence of other vascular implants and grafts: Secondary | ICD-10-CM

## 2021-12-24 DIAGNOSIS — E039 Hypothyroidism, unspecified: Secondary | ICD-10-CM | POA: Diagnosis not present

## 2021-12-24 DIAGNOSIS — E538 Deficiency of other specified B group vitamins: Secondary | ICD-10-CM

## 2021-12-24 LAB — CBC WITH DIFFERENTIAL/PLATELET
Abs Immature Granulocytes: 0.02 10*3/uL (ref 0.00–0.07)
Basophils Absolute: 0.1 10*3/uL (ref 0.0–0.1)
Basophils Relative: 1 %
Eosinophils Absolute: 0.2 10*3/uL (ref 0.0–0.5)
Eosinophils Relative: 2 %
HCT: 45.9 % (ref 39.0–52.0)
Hemoglobin: 14.9 g/dL (ref 13.0–17.0)
Immature Granulocytes: 0 %
Lymphocytes Relative: 7 %
Lymphs Abs: 0.6 10*3/uL — ABNORMAL LOW (ref 0.7–4.0)
MCH: 31.9 pg (ref 26.0–34.0)
MCHC: 32.5 g/dL (ref 30.0–36.0)
MCV: 98.3 fL (ref 80.0–100.0)
Monocytes Absolute: 1.1 10*3/uL — ABNORMAL HIGH (ref 0.1–1.0)
Monocytes Relative: 12 %
Neutro Abs: 7 10*3/uL (ref 1.7–7.7)
Neutrophils Relative %: 78 %
Platelets: 357 10*3/uL (ref 150–400)
RBC: 4.67 MIL/uL (ref 4.22–5.81)
RDW: 14.6 % (ref 11.5–15.5)
WBC: 9 10*3/uL (ref 4.0–10.5)
nRBC: 0 % (ref 0.0–0.2)

## 2021-12-24 LAB — COMPREHENSIVE METABOLIC PANEL
ALT: 28 U/L (ref 0–44)
AST: 24 U/L (ref 15–41)
Albumin: 3.8 g/dL (ref 3.5–5.0)
Alkaline Phosphatase: 155 U/L — ABNORMAL HIGH (ref 38–126)
Anion gap: 12 (ref 5–15)
BUN: 43 mg/dL — ABNORMAL HIGH (ref 8–23)
CO2: 30 mmol/L (ref 22–32)
Calcium: 9.8 mg/dL (ref 8.9–10.3)
Chloride: 91 mmol/L — ABNORMAL LOW (ref 98–111)
Creatinine, Ser: 1.23 mg/dL (ref 0.61–1.24)
GFR, Estimated: >60 mL/min (ref >60)
Glucose, Bld: 206 mg/dL — ABNORMAL HIGH (ref 70–99)
Potassium: 4.4 mmol/L (ref 3.5–5.1)
Sodium: 133 mmol/L — ABNORMAL LOW (ref 135–145)
Total Bilirubin: 0.7 mg/dL (ref 0.3–1.2)
Total Protein: 8.2 g/dL — ABNORMAL HIGH (ref 6.5–8.1)

## 2021-12-24 LAB — IRON AND TIBC
Iron: 90 ug/dL (ref 45–182)
Saturation Ratios: 21 % (ref 17.9–39.5)
TIBC: 440 ug/dL (ref 250–450)
UIBC: 350 ug/dL

## 2021-12-24 LAB — MAGNESIUM: Magnesium: 2.2 mg/dL (ref 1.7–2.4)

## 2021-12-24 LAB — FERRITIN: Ferritin: 74 ng/mL (ref 24–336)

## 2021-12-24 LAB — VITAMIN B12: Vitamin B-12: 1386 pg/mL — ABNORMAL HIGH (ref 180–914)

## 2021-12-24 MED ORDER — IOHEXOL 300 MG/ML  SOLN
100.0000 mL | Freq: Once | INTRAMUSCULAR | Status: AC | PRN
  Administered 2021-12-24: 75 mL via INTRAVENOUS

## 2021-12-24 MED ORDER — HEPARIN SOD (PORK) LOCK FLUSH 100 UNIT/ML IV SOLN
500.0000 [IU] | Freq: Once | INTRAVENOUS | Status: AC
  Administered 2021-12-24: 500 [IU] via INTRAVENOUS

## 2021-12-24 MED ORDER — SODIUM CHLORIDE 0.9% FLUSH
10.0000 mL | Freq: Once | INTRAVENOUS | Status: AC
  Administered 2021-12-24: 10 mL via INTRAVENOUS

## 2021-12-24 NOTE — Progress Notes (Signed)
Port flushed with good blood return noted. No bruising or swelling at site. Patient left accessed for CT, and patient discharged in satisfactory condition. VVS stable with no signs or symptoms of distressed noted.

## 2021-12-24 NOTE — Patient Instructions (Signed)
[facility]  Discharge Instructions: Thank you for choosing [facility] to provide your oncology and hematology care.  If you have a lab appointment with the [facility], please come in thru the Main Entrance and check in at the main information desk.  Wear comfortable clothing and clothing appropriate for easy access to any Portacath or PICC line.   We strive to give you quality time with your provider. You may need to reschedule your appointment if you arrive late (15 or more minutes).  Arriving late affects you and other patients whose appointments are after yours.  Also, if you miss three or more appointments without notifying the office, you may be dismissed from the clinic at the provider's discretion.      For prescription refill requests, have your pharmacy contact our office and allow 72 hours for refills to be completed.    Today you received the following port accessed for CT scan, return as scheduled.   To help prevent nausea and vomiting after your treatment, we encourage you to take your nausea medication as directed.  BELOW ARE SYMPTOMS THAT SHOULD BE REPORTED IMMEDIATELY: *FEVER GREATER THAN 100.4 F (38 C) OR HIGHER *CHILLS OR SWEATING *NAUSEA AND VOMITING THAT IS NOT CONTROLLED WITH YOUR NAUSEA MEDICATION *UNUSUAL SHORTNESS OF BREATH *UNUSUAL BRUISING OR BLEEDING *URINARY PROBLEMS (pain or burning when urinating, or frequent urination) *BOWEL PROBLEMS (unusual diarrhea, constipation, pain near the anus) TENDERNESS IN MOUTH AND THROAT WITH OR WITHOUT PRESENCE OF ULCERS (sore throat, sores in mouth, or a toothache) UNUSUAL RASH, SWELLING OR PAIN  UNUSUAL VAGINAL DISCHARGE OR ITCHING   Items with * indicate a potential emergency and should be followed up as soon as possible or go to the Emergency Department if any problems should occur.  Please show the CHEMOTHERAPY ALERT CARD or IMMUNOTHERAPY ALERT CARD at check-in to the Emergency  Department and triage nurse.  Should you have questions after your visit or need to cancel or reschedule your appointment, please contact [facility] [phone]  and follow the prompts.  Office hours are 8:00 a.m. to 4:30 p.m. Monday - Friday. Please note that voicemails left after 4:00 p.m. may not be returned until the following business day.  We are closed weekends and major holidays. You have access to a nurse at all times for urgent questions. Please call the main number to the clinic [phone] and follow the prompts.  For any non-urgent questions, you may also contact your provider using MyChart. We now offer e-Visits for anyone 56 and older to request care online for non-urgent symptoms. For details visit [email or web address].   Also download the MyChart app! Go to the app store, search "MyChart", open the app, select [facility], and log in with your MyChart username and password.  Masks are optional in the cancer centers. If you would like for your care team to wear a mask while they are taking care of you, please let them know. You may have one support person who is at least 76 years old accompany you for your appointments.

## 2021-12-25 DIAGNOSIS — I152 Hypertension secondary to endocrine disorders: Secondary | ICD-10-CM | POA: Diagnosis not present

## 2021-12-25 DIAGNOSIS — I5033 Acute on chronic diastolic (congestive) heart failure: Secondary | ICD-10-CM | POA: Diagnosis not present

## 2021-12-25 DIAGNOSIS — E1159 Type 2 diabetes mellitus with other circulatory complications: Secondary | ICD-10-CM | POA: Diagnosis not present

## 2021-12-25 DIAGNOSIS — D62 Acute posthemorrhagic anemia: Secondary | ICD-10-CM | POA: Diagnosis not present

## 2021-12-25 DIAGNOSIS — J449 Chronic obstructive pulmonary disease, unspecified: Secondary | ICD-10-CM | POA: Diagnosis not present

## 2021-12-25 DIAGNOSIS — K922 Gastrointestinal hemorrhage, unspecified: Secondary | ICD-10-CM | POA: Diagnosis not present

## 2021-12-26 ENCOUNTER — Other Ambulatory Visit: Payer: Self-pay | Admitting: Family

## 2021-12-26 DIAGNOSIS — F17210 Nicotine dependence, cigarettes, uncomplicated: Secondary | ICD-10-CM | POA: Diagnosis not present

## 2021-12-26 DIAGNOSIS — T451X5D Adverse effect of antineoplastic and immunosuppressive drugs, subsequent encounter: Secondary | ICD-10-CM | POA: Diagnosis not present

## 2021-12-26 DIAGNOSIS — I4892 Unspecified atrial flutter: Secondary | ICD-10-CM | POA: Diagnosis not present

## 2021-12-26 DIAGNOSIS — E1151 Type 2 diabetes mellitus with diabetic peripheral angiopathy without gangrene: Secondary | ICD-10-CM | POA: Diagnosis not present

## 2021-12-26 DIAGNOSIS — E782 Mixed hyperlipidemia: Secondary | ICD-10-CM | POA: Diagnosis not present

## 2021-12-26 DIAGNOSIS — I152 Hypertension secondary to endocrine disorders: Secondary | ICD-10-CM | POA: Diagnosis not present

## 2021-12-26 DIAGNOSIS — E032 Hypothyroidism due to medicaments and other exogenous substances: Secondary | ICD-10-CM | POA: Diagnosis not present

## 2021-12-26 DIAGNOSIS — R338 Other retention of urine: Secondary | ICD-10-CM | POA: Diagnosis not present

## 2021-12-26 DIAGNOSIS — C3491 Malignant neoplasm of unspecified part of right bronchus or lung: Secondary | ICD-10-CM | POA: Diagnosis not present

## 2021-12-26 DIAGNOSIS — D630 Anemia in neoplastic disease: Secondary | ICD-10-CM | POA: Diagnosis not present

## 2021-12-26 DIAGNOSIS — N401 Enlarged prostate with lower urinary tract symptoms: Secondary | ICD-10-CM | POA: Diagnosis not present

## 2021-12-26 DIAGNOSIS — G4733 Obstructive sleep apnea (adult) (pediatric): Secondary | ICD-10-CM | POA: Diagnosis not present

## 2021-12-26 DIAGNOSIS — F32A Depression, unspecified: Secondary | ICD-10-CM | POA: Diagnosis not present

## 2021-12-26 DIAGNOSIS — H3530 Unspecified macular degeneration: Secondary | ICD-10-CM | POA: Diagnosis not present

## 2021-12-26 DIAGNOSIS — F419 Anxiety disorder, unspecified: Secondary | ICD-10-CM | POA: Diagnosis not present

## 2021-12-26 DIAGNOSIS — J9611 Chronic respiratory failure with hypoxia: Secondary | ICD-10-CM | POA: Diagnosis not present

## 2021-12-26 DIAGNOSIS — K219 Gastro-esophageal reflux disease without esophagitis: Secondary | ICD-10-CM | POA: Diagnosis not present

## 2021-12-26 DIAGNOSIS — I5032 Chronic diastolic (congestive) heart failure: Secondary | ICD-10-CM | POA: Diagnosis not present

## 2021-12-26 DIAGNOSIS — E1140 Type 2 diabetes mellitus with diabetic neuropathy, unspecified: Secondary | ICD-10-CM | POA: Diagnosis not present

## 2021-12-26 DIAGNOSIS — E1169 Type 2 diabetes mellitus with other specified complication: Secondary | ICD-10-CM | POA: Diagnosis not present

## 2021-12-26 DIAGNOSIS — I480 Paroxysmal atrial fibrillation: Secondary | ICD-10-CM | POA: Diagnosis not present

## 2021-12-26 DIAGNOSIS — M1990 Unspecified osteoarthritis, unspecified site: Secondary | ICD-10-CM | POA: Diagnosis not present

## 2021-12-26 DIAGNOSIS — E1159 Type 2 diabetes mellitus with other circulatory complications: Secondary | ICD-10-CM | POA: Diagnosis not present

## 2021-12-26 DIAGNOSIS — J449 Chronic obstructive pulmonary disease, unspecified: Secondary | ICD-10-CM | POA: Diagnosis not present

## 2021-12-26 DIAGNOSIS — E1165 Type 2 diabetes mellitus with hyperglycemia: Secondary | ICD-10-CM | POA: Diagnosis not present

## 2021-12-30 ENCOUNTER — Encounter: Payer: Self-pay | Admitting: Hematology

## 2021-12-30 ENCOUNTER — Inpatient Hospital Stay (HOSPITAL_BASED_OUTPATIENT_CLINIC_OR_DEPARTMENT_OTHER): Payer: Medicare Other | Admitting: Hematology

## 2021-12-30 ENCOUNTER — Ambulatory Visit (INDEPENDENT_AMBULATORY_CARE_PROVIDER_SITE_OTHER): Payer: Medicare Other

## 2021-12-30 VITALS — BP 136/60 | HR 88 | Temp 97.5°F | Resp 18 | Ht 66.0 in | Wt 147.7 lb

## 2021-12-30 DIAGNOSIS — J449 Chronic obstructive pulmonary disease, unspecified: Secondary | ICD-10-CM

## 2021-12-30 DIAGNOSIS — E782 Mixed hyperlipidemia: Secondary | ICD-10-CM

## 2021-12-30 DIAGNOSIS — I4892 Unspecified atrial flutter: Secondary | ICD-10-CM

## 2021-12-30 DIAGNOSIS — E1151 Type 2 diabetes mellitus with diabetic peripheral angiopathy without gangrene: Secondary | ICD-10-CM

## 2021-12-30 DIAGNOSIS — D509 Iron deficiency anemia, unspecified: Secondary | ICD-10-CM

## 2021-12-30 DIAGNOSIS — I480 Paroxysmal atrial fibrillation: Secondary | ICD-10-CM

## 2021-12-30 DIAGNOSIS — E1140 Type 2 diabetes mellitus with diabetic neuropathy, unspecified: Secondary | ICD-10-CM

## 2021-12-30 DIAGNOSIS — I5032 Chronic diastolic (congestive) heart failure: Secondary | ICD-10-CM | POA: Diagnosis not present

## 2021-12-30 DIAGNOSIS — E032 Hypothyroidism due to medicaments and other exogenous substances: Secondary | ICD-10-CM

## 2021-12-30 DIAGNOSIS — C3491 Malignant neoplasm of unspecified part of right bronchus or lung: Secondary | ICD-10-CM

## 2021-12-30 DIAGNOSIS — Z923 Personal history of irradiation: Secondary | ICD-10-CM | POA: Diagnosis not present

## 2021-12-30 DIAGNOSIS — J9611 Chronic respiratory failure with hypoxia: Secondary | ICD-10-CM

## 2021-12-30 DIAGNOSIS — I152 Hypertension secondary to endocrine disorders: Secondary | ICD-10-CM

## 2021-12-30 DIAGNOSIS — Z79899 Other long term (current) drug therapy: Secondary | ICD-10-CM | POA: Diagnosis not present

## 2021-12-30 DIAGNOSIS — E1159 Type 2 diabetes mellitus with other circulatory complications: Secondary | ICD-10-CM | POA: Diagnosis not present

## 2021-12-30 DIAGNOSIS — D630 Anemia in neoplastic disease: Secondary | ICD-10-CM

## 2021-12-30 DIAGNOSIS — E1169 Type 2 diabetes mellitus with other specified complication: Secondary | ICD-10-CM

## 2021-12-30 DIAGNOSIS — E1165 Type 2 diabetes mellitus with hyperglycemia: Secondary | ICD-10-CM | POA: Diagnosis not present

## 2021-12-30 DIAGNOSIS — Z9221 Personal history of antineoplastic chemotherapy: Secondary | ICD-10-CM | POA: Diagnosis not present

## 2021-12-30 DIAGNOSIS — F17210 Nicotine dependence, cigarettes, uncomplicated: Secondary | ICD-10-CM | POA: Diagnosis not present

## 2021-12-30 DIAGNOSIS — G4733 Obstructive sleep apnea (adult) (pediatric): Secondary | ICD-10-CM

## 2021-12-30 DIAGNOSIS — E039 Hypothyroidism, unspecified: Secondary | ICD-10-CM | POA: Diagnosis not present

## 2021-12-30 NOTE — Patient Instructions (Signed)
[facility]  Discharge Instructions  You were seen and examined today by Dr. Delton Coombes.  Dr. Delton Coombes discussed your most recent lab work and CT scan which revealed only a 42mm growth of the nodule on your right upper lobe. At this point it would be too small for PET scan resolution. Dr. Delton Coombes has recommended a repeat CT scan in 4 months to recheck that spot.  Follow-up as scheduled.  Thank you for choosing [facility] to provide your oncology and hematology care.   To afford each patient quality time with our provider, please arrive at least 15 minutes before your scheduled appointment time. You may need to reschedule your appointment if you arrive late (10 or more minutes). Arriving late affects you and other patients whose appointments are after yours.  Also, if you miss three or more appointments without notifying the office, you may be dismissed from the clinic at the provider's discretion.    Again, thank you for choosing [facility].  Our hope is that these requests will decrease the amount of time that you wait before being seen by our physicians.   If you have a lab appointment with the [facility] please come in thru the Main Entrance and check in at the main information desk.           _____________________________________________________________  Should you have questions after your visit to [facility], please contact our office at [phone] and follow the prompts.  Our office hours are 8:00 a.m. to 4:30 p.m. Monday - Thursday and 8:00 a.m. to 2:30 p.m. Friday.  Please note that voicemails left after 4:00 p.m. may not be returned until the following business day.  We are closed weekends and all major holidays.  You do have access to a nurse 24-7, just call the main number to the clinic [phone] and do not press any options, hold on the line and a nurse will answer the phone.    For  prescription refill requests, have your pharmacy contact our office and allow 72 hours.    Masks are optional in the cancer centers. If you would like for your care team to wear a mask while they are taking care of you, please let them know. You may have one support person who is at least 76 years old accompany you for your appointments.

## 2021-12-30 NOTE — Progress Notes (Signed)
[name], [place] [address]   CLINIC:  Medical Oncology/Hematology  PCP:  [name], [place] / [place] [address] [phone]   REASON FOR VISIT:  Follow-up for right NSCLC  PRIOR THERAPY:  1. Chemoradiation with weekly carboplatin and paclitaxel from 06/25/2016 to 08/20/2016. 2. Consolidation durvalumab from 09/26/2016 to 09/25/2017.  NGS Results: not done  CURRENT THERAPY: surveillance  BRIEF ONCOLOGIC HISTORY:  Oncology History  Non-small cell lung cancer, right ([place])  04/17/2016 Imaging   CT chest- 1. Examination is positive for bilateral upper lobe pulmonary lesions suspicious for malignancy. Further investigation with PET-CT and tissue sampling is advised. 2. Borderline enlarged paratracheal lymph nodes and right hilar nodes. Cannot rule out metastatic adenopathy. Attention to these areas on PET-CT is advised. 3. Emphysema 4. Aortic atherosclerosis and multi vessel coronary artery calcification.   04/18/2016 Initial Diagnosis   Adenocarcinoma of lung, right ([place])   04/24/2016 PET scan   1. Adjacent hypermetabolic nodules in the RIGHT upper lobe consistent bronchogenic carcinoma. 2. Suspicion of ipsilateral hilar and paratracheal nodal metastasis. 3. Nodule in the LEFT upper lobe with suspicious morphology has a relatively low metabolic activity for size. Favor synchronous primary bronchogenic carcinoma. 4. No metabolic activity associated with a small LEFT lower lobe pulmonary nodule. Recommend attention on follow-up.   05/06/2016 Imaging   MRI brain- Negative for metastatic disease.   05/28/2016 Procedure   1. Electromagnetic navigational bronchoscopy with brushings, needle     aspirations and biopsies of bilateral upper lobe nodules. 2. Endobronchial ultrasound with mediastinal and hilar lymph node     aspirations. By Dr. [name]   06/03/2016 Pathology Results   Diagnosis 1. Lung,  biopsy, Right upper lobe - BLOOD AND BENIGN BRONCHIAL AND LUNG TISSUE. 2. Lung, biopsy, Left upper lobe - BLOOD AND BENIGN BRONCHIAL AND LUNG TISSUE.   06/03/2016 Pathology Results   Diagnosis TRANSBRONCHIAL NEEDLE ASPIRATION, NAVIGATION, RUL (SPECIMEN 5 OF 7, COLLECTED 05/28/16): MALIGNANT CELLS CONSISTENT WITH NON-SMALL CELL CARCINOMA Comment There are limited tumor cells in the cell block (insufficient for molecular testing). TTF-1 is positive in a few of the atypical cells. They appear negative for synaptophysin and cytokeratin 5/6. Overall there is limited tumor, but a lung adenocarcinoma is slightly favored. Dr. [name] has reviewed the case. The case was called to Dr. [name] on 06/03/2016.   06/13/2016 Procedure   Port placed by Dr. Arnoldo Morale   06/25/2016 - 08/15/2016 Chemotherapy   The patient had palonosetron (ALOXI) injection 0.25 mg, 0.25 mg, Intravenous,  Once, 6 of 6 cycles  CARBOplatin (PARAPLATIN) 220 mg in sodium chloride 0.9 % 250 mL chemo infusion, 220 mg (100 % of original dose 215.8 mg), Intravenous,  Once, 6 of 6 cycles Dose modification:   (original dose 215.8 mg, Cycle 1),   (original dose 215.8 mg, Cycle 5),   (original dose 215.8 mg, Cycle 6),   (original dose 215.8 mg, Cycle 2),   (original dose 215.8 mg, Cycle 3),   (original dose 215.8 mg, Cycle 4)  PACLitaxel (TAXOL) 90 mg in dextrose 5 % 250 mL chemo infusion ( for chemotherapy treatment.     06/25/2016 - 08/20/2016 Radiation Therapy   Radiation in [place], [place] by Dr. Quitman Livings.  R lung to 6600 cGy   07/09/2016 - 07/10/2016 Hospital Admission   Admit date: 07/09/2016  Admission diagnosis: Fever and chills Additional comments: Negative work-up, discharged with course of Levaquin   07/09/2016 Treatment Plan Change  Treatment deferred x 1 week due to hospitalization    08/05/2016 - 08/06/2016 Hospital Admission   Admit date: 08/05/2016 Admission diagnosis: Joint pain Additional comments: Treated with steroids with  symptomatic improvement   08/25/2016 Imaging   MRI brain- Negative for intracranial metastasis on this motion degraded examination.  Stable mild-to-moderate chronic small vessel ischemic disease.   09/18/2016 PET scan   1. Partial metabolic response. Right upper lobe 2.6 cm hypermetabolic pulmonary nodule, decreased in size and metabolism. Mildly hypermetabolic right hilar nodal metastasis, decreased in metabolism. Right paratracheal lymph node is no longer hypermetabolic. No new or progressive metastatic disease. 2. Apical left upper lobe 0.9 cm solid pulmonary nodule, slightly decreased in size, with no significant metabolism. The change in size could indicate a neoplastic etiology for this nodule. 3. Additional subcentimeter pulmonary nodules in the left lung are stable and below PET resolution . 4. Additional findings include aortic atherosclerosis, coronary atherosclerosis, moderate emphysema, stable small pericardial effusion/thickening and mild sigmoid diverticulosis.   01/13/2017 Imaging   CT chest with contrast IMPRESSION: 1. No change in size of the hypermetabolic nodular thickening in the RIGHT upper lobe. 2. LEFT upper lobe spiculated nodule is stable. 3. Rounded LEFT lower lobe pulmonary nodule is stable. 4. No evidence of lung cancer progression.   05/08/2017 Adverse Reaction   Development of Hypothyroidism- likely secondary to immunotherapy.  Levothyroxine therapy initiated.      CANCER STAGING:  Cancer Staging  Non-small cell lung cancer, right [facility]) Staging form: Lung, AJCC 8th Edition - Clinical stage from 06/05/2016: Stage IIIA (cT1b(2), cN2, cM0) - Signed by Baird Cancer, PA-C on 08/21/2016   INTERVAL HISTORY:  [name], a 76 y.o. male, seen for follow-up of right non-small cell lung cancer.  Baseline cough and shortness of breath is stable.  Denies any hemoptysis.  He is taking iron tablet once daily.  REVIEW OF SYSTEMS:  Review of Systems   Respiratory:  Positive for cough and shortness of breath.   Gastrointestinal:  Positive for constipation.  Neurological:  Positive for numbness.  All other systems reviewed and are negative.   PAST MEDICAL/SURGICAL HISTORY:  Past Medical History:  Diagnosis Date   Adenocarcinoma of lung, right ([place]) 04/18/2016   Anemia    Anxiety    Arthritis    Asthma    Atrial flutter (H[facility])    BPH (benign prostatic hyperplasia)    with urinary retention 02/06/20   CHF (congestive heart failure) (HCC)    COPD (chronic obstructive pulmonary disease) ([place])    Depression    Diabetes mellitus, type II ([place])    Dyspnea    History of kidney stones    History of radiation therapy    right lung 09/10/2020-09/17/2020   Dr Sondra Come   Hyperlipidemia    Hypertension    Hypothyroidism    Macular degeneration    Neuropathy    Non-small cell lung cancer, right ([place]) 04/18/2016   Peripheral vascular disease ([place])    Prostatitis    Pulmonary nodule, left 07/16/2016   Sleep apnea    cpap   Past Surgical History:  Procedure Laterality Date   ABDOMINAL AORTOGRAM W/LOWER EXTREMITY Left 02/06/2020   Procedure: ABDOMINAL AORTOGRAM W/LOWER EXTREMITY;  Surgeon: Lorretta Harp, MD;  Location: [facility];  Service: Cardiovascular;  Laterality: Left;   ABDOMINAL AORTOGRAM W/LOWER EXTREMITY N/A 05/30/2021   Procedure: ABDOMINAL AORTOGRAM W/LOWER EXTREMITY;  Surgeon: Marty Heck, MD;  Location: [facility];  Service:  Cardiovascular;  Laterality: N/A;   BIOPSY  09/16/2021   Procedure: BIOPSY;  Surgeon: Gatha Mayer, MD;  Location: [facility];  Service: Gastroenterology;;   CARDIOVERSION N/A 10/05/2020   Procedure: CARDIOVERSION;  Surgeon: Geralynn Rile, MD;  Location: [facility];  Service: Cardiovascular;  Laterality: N/A;   CATARACT EXTRACTION, BILATERAL Bilateral    COLONOSCOPY WITH PROPOFOL N/A 06/20/2021   Procedure: COLONOSCOPY WITH PROPOFOL;  Surgeon: Irene Shipper, MD;   [facility];  Service: Endoscopy;  Laterality: N/A;   COLONOSCOPY WITH PROPOFOL N/A 09/16/2021   Procedure: COLONOSCOPY WITH PROPOFOL;  Surgeon: Gatha Mayer, MD;  Location: [facility];  Service: Gastroenterology;  Laterality: N/A;   ENDARTERECTOMY FEMORAL Right 08/20/2020   Procedure: ENDARTERECTOMY  RIGHT FEMORAL ARTERY;  Surgeon: Marty Heck, MD;  Location: [facility];  Service: Vascular;  Laterality: Right;   ENTEROSCOPY N/A 10/11/2021   Procedure: ENTEROSCOPY;  Surgeon: Harvel Quale, MD;  [facility];  Service: Gastroenterology;  Laterality: N/A;   ESOPHAGOGASTRODUODENOSCOPY N/A 09/16/2021   Procedure: ESOPHAGOGASTRODUODENOSCOPY (EGD);  Surgeon: Gatha Mayer, MD;  Location: [facility];  Service: Gastroenterology;  Laterality: N/A;   ESOPHAGOGASTRODUODENOSCOPY (EGD) WITH PROPOFOL N/A 09/06/2020   Procedure: ESOPHAGOGASTRODUODENOSCOPY (EGD) WITH PROPOFOL;  Surgeon: Irene Shipper, MD;  Location: [facility];  Service: Endoscopy;  Laterality: N/A;   ESOPHAGOGASTRODUODENOSCOPY (EGD) WITH PROPOFOL N/A 10/11/2021   Procedure: ESOPHAGOGASTRODUODENOSCOPY (EGD) WITH PROPOFOL;  Surgeon: Harvel Quale, MD;  [facility];  Service: Gastroenterology;  Laterality: N/A;   HEMOSTASIS CLIP PLACEMENT  09/16/2021   Procedure: HEMOSTASIS CLIP PLACEMENT;  Surgeon: Gatha Mayer, MD;  Location: [facility];  Service: Gastroenterology;;   HOT HEMOSTASIS N/A 09/16/2021   Procedure: HOT HEMOSTASIS (ARGON PLASMA COAGULATION/BICAP);  Surgeon: Gatha Mayer, MD;  Location: [facility];  Service: Gastroenterology;  Laterality: N/A;   HOT HEMOSTASIS  10/11/2021   Procedure: HOT HEMOSTASIS (ARGON PLASMA COAGULATION/BICAP);  Surgeon: Montez Morita, Quillian Quince, MD;  [facility];  Service: Gastroenterology;;   INSERTION OF ILIAC STENT Right 08/20/2020   Procedure: RETROGRADE INSERTION OF RIGHT ILIAC STENT;  Surgeon: Marty Heck, MD;   Location: [facility];  Service: Vascular;  Laterality: Right;   INTRAOPERATIVE ARTERIOGRAM Right 08/20/2020   Procedure: INTRA OPERATIVE ARTERIOGRAM ILIAC;  Surgeon: Marty Heck, MD;  Location: [facility];  Service: Vascular;  Laterality: Right;   PATCH ANGIOPLASTY Right 08/20/2020   Procedure: PATCH ANGIOPLASTY RIGHT FEMORAL ARTERY;  Surgeon: Marty Heck, MD;  Location: [facility];  Service: Vascular;  Laterality: Right;   PERIPHERAL VASCULAR BALLOON ANGIOPLASTY Right 05/30/2021   Procedure: PERIPHERAL VASCULAR BALLOON ANGIOPLASTY;  Surgeon: Marty Heck, MD;  Location: [facility];  Service: Cardiovascular;  Laterality: Right;   PORTACATH PLACEMENT Left 06/13/2016   Procedure: INSERTION PORT-A-CATH;  Surgeon: Aviva Signs, MD;  [facility];  Service: General;  Laterality: Left;   TRANSURETHRAL RESECTION OF PROSTATE N/A 05/31/2020   Procedure: TRANSURETHRAL RESECTION OF THE PROSTATE (TURP);  Surgeon: Cleon Gustin, MD;  [facility];  Service: Urology;  Laterality: N/A;   VIDEO BRONCHOSCOPY WITH ENDOBRONCHIAL NAVIGATION N/A 05/28/2016   Procedure: VIDEO BRONCHOSCOPY WITH ENDOBRONCHIAL NAVIGATION;  Surgeon: [name], MD;  Location: [facility];  Service: Thoracic;  Laterality: N/A;   VIDEO BRONCHOSCOPY WITH ENDOBRONCHIAL ULTRASOUND N/A 05/28/2016   Procedure: VIDEO BRONCHOSCOPY WITH ENDOBRONCHIAL ULTRASOUND;  Surgeon: [name], MD;  Location: [facility];  Service: Thoracic;  Laterality: N/A;  SOCIAL HISTORY:  Social History   Socioeconomic History   Marital status: Soil scientist    Spouse name: Not on file   Number of children: 5   Years of education: 10   Highest education level: 10th grade  Occupational History   Occupation: Retired  Tobacco Use   Smoking status: Every Day    Packs/day: 0.50    Years: 55.00    Total pack years: 27.50    Types: Cigarettes    Start date: 03/11/1961   Smokeless tobacco: Never   Tobacco comments:    1/2  pack to 1 pack per day 12/14/19  Vaping Use   Vaping Use: Never used  Substance and Sexual Activity   Alcohol use: No   Drug use: No   Sexual activity: Yes    Birth control/protection: None  Other Topics Concern   Not on file  Social History Narrative   [name] is his POA and lives with him. Children out of town - one in [place], others in [place].   Social Determinants of Health   Financial Resource Strain: Low Risk  (11/15/2021)   Overall Financial Resource Strain (CARDIA)    Difficulty of Paying Living Expenses: Not hard at all  Food Insecurity: No Food Insecurity (11/15/2021)   Hunger Vital Sign    Worried About Running Out of Food in the Last Year: Never true    Ran Out of Food in the Last Year: Never true  Transportation Needs: No Transportation Needs (11/15/2021)   PRAPARE - Hydrologist (Medical): No    Lack of Transportation (Non-Medical): No  Physical Activity: Inactive (11/15/2021)   Exercise Vital Sign    Days of Exercise per Week: 0 days    Minutes of Exercise per Session: 0 min  Stress: No Stress Concern Present (11/15/2021)   [place]    Feeling of Stress : Only a little  Social Connections: Moderately Isolated (11/15/2021)   Social Connection and Isolation Panel [NHANES]    Frequency of Communication with Friends and Family: More than three times a week    Frequency of Social Gatherings with Friends and Family: Once a week    Attends Religious Services: Never    Marine scientist or Organizations: No    Attends Archivist Meetings: Never    Marital Status: Living with partner  Intimate Partner Violence: Not At Risk (11/15/2021)   Humiliation, Afraid, Rape, and Kick questionnaire    Fear of Current or Ex-Partner: No    Emotionally Abused: No    Physically Abused: No    Sexually Abused: No    FAMILY HISTORY:  Family History  Problem Relation Age of  Onset   Hypertension Mother    Diabetes Father    Heart disease Father    Stroke Father    Hypertension Sister     CURRENT MEDICATIONS:  Current Outpatient Medications  Medication Sig Dispense Refill   amoxicillin-clavulanate (AUGMENTIN) 875-125 MG tablet Take 1 tablet by mouth 2 (two) times daily. 20 tablet 0   apixaban (ELIQUIS) 5 MG TABS tablet Take 1 tablet (5 mg total) by mouth 2 (two) times daily. 60 tablet 2   Blood Glucose Monitoring Suppl (ONETOUCH VERIO REFLECT) w/Device KIT Use to test blood sugars daily as directed. DX: E11.9 1 kit 1   [employer] IRON 325 (65 Fe) MG tablet TAKE 1 TABLET (325MG) TWICE DAILY 200 tablet 1  Cyanocobalamin (B-12 PO) Take 1 tablet by mouth daily.     dutasteride (AVODART) 0.5 MG capsule Take 1 capsule (0.5 mg total) by mouth daily. 90 capsule 3   ezetimibe (ZETIA) 10 MG tablet Take 10 mg by mouth daily.     famotidine (PEPCID) 20 MG tablet One after supper (Patient taking differently: Take 20 mg by mouth daily.) 30 tablet 11   fluticasone (FLONASE) 50 MCG/ACT nasal spray PLACE 1 SPRAY INTO BOTH NOSTRILS DAILY AS NEEDED FOR ALLERGIES OR RHINITIS. 48 mL 3   Fluticasone-Umeclidin-Vilant (TRELEGY ELLIPTA) 100-62.5-25 MCG/ACT AEPB Inhale 1 puff into the lungs daily. 1 each 11   gabapentin (NEURONTIN) 300 MG capsule TAKE 1 CAPSULE BY MOUTH THREE TIMES A DAY 270 capsule 0   HYDROcodone-acetaminophen (NORCO) 5-325 MG tablet Take 0.5-1 tablets by mouth every 8 (eight) hours as needed for severe pain. 10 tablet 0   KLOR-CON M20 20 MEQ tablet TAKE 1 TABLET BY MOUTH EVERY DAY 90 tablet 0   levothyroxine (SYNTHROID) 50 MCG tablet TAKE 1 TABLET BY MOUTH EVERY DAY (Patient taking differently: Take 50 mcg by mouth daily before breakfast.) 90 tablet 1   linaclotide (LINZESS) 72 MCG capsule Take 1 capsule (72 mcg total) by mouth daily as needed (constipation).     Melatonin 10 MG TABS Take 10 mg by mouth at bedtime as needed (sleep).     metolazone (ZAROXOLYN) 2.5 MG tablet  Take by mouth.     NON FORMULARY CPAP at bedtime     ondansetron (ZOFRAN) 4 MG tablet Take 1 tablet (4 mg total) by mouth every 8 (eight) hours as needed for nausea or vomiting. 60 tablet 0   OneTouch Delica Lancets 65Y MISC Use to test blood sugars daily as directed. DX: E11.9 100 each 1   ONETOUCH VERIO test strip USE TO TEST BLOOD SUGARS DAILY AS DIRECTED. DX: E11.9 100 strip 3   OXYGEN Inhale 2 L into the lungs daily as needed.     pantoprazole (PROTONIX) 40 MG tablet Take 1 tablet (40 mg total) by mouth 2 (two) times daily before a meal. 60 tablet 5   pravastatin (PRAVACHOL) 40 MG tablet TAKE 1 TABLET (40 MG TOTAL) BY MOUTH ON MONDAYS , WEDNESDAYS, AND FRIDAYS AS DIRECTED (Patient taking differently: Take 40 mg by mouth 3 (three) times a week. Mon, wed, and fri) 48 tablet 0   Semaglutide (RYBELSUS) 7 MG TABS Take 7 mg by mouth daily. 90 tablet 1   spironolactone (ALDACTONE) 25 MG tablet Take 1 tablet (25 mg total) by mouth daily. 90 tablet 3   torsemide (DEMADEX) 20 MG tablet Take 2 tablets (40 mg) in the AM. 180 tablet 2   zolpidem (AMBIEN) 5 MG tablet Take 1 tablet (5 mg total) by mouth at bedtime as needed for sleep. 30 tablet 2   No current facility-administered medications for this visit.    ALLERGIES:  Allergies  Allergen Reactions   Sulfa Antibiotics Swelling    Mouth swelling   Atorvastatin Other (See Comments)    Muscle aches - tolerating Pravastatin 40 mg MWF   Jardiance [Empagliflozin] Other (See Comments)    FEELS SLUGGISH, TIRED   Lopressor [Metoprolol] Other (See Comments)    Fatigue   Rosuvastatin Other (See Comments)    Muscle aches - tolerating Pravastatin 40 mg MWF   Doxycycline Nausea Only   Temazepam Other (See Comments)    Made insomnia worse     PHYSICAL EXAM:  Performance status (ECOG): 1 - Symptomatic  but completely ambulatory  Vitals:   12/30/21 1148  BP: 136/60  Pulse: 88  Resp: 18  Temp: (!) 97.5 F (36.4 C)  SpO2: 100%   Wt Readings from  Last 3 Encounters:  12/30/21 147 lb 11.2 oz (67 kg)  12/20/21 147 lb 3.2 oz (66.8 kg)  12/18/21 147 lb (66.7 kg)   Physical Exam Vitals reviewed.  Constitutional:      Appearance: Normal appearance.  Cardiovascular:     Rate and Rhythm: Normal rate and regular rhythm.     Pulses: Normal pulses.     Heart sounds: Normal heart sounds.  Pulmonary:     Effort: Pulmonary effort is normal.     Breath sounds: Normal breath sounds.  Neurological:     General: No focal deficit present.     Mental Status: He is alert and oriented to person, place, and time.  Psychiatric:        Mood and Affect: Mood normal.        Behavior: Behavior normal.      LABORATORY DATA:  I have reviewed the labs as listed.     Latest Ref Rng & Units 12/24/2021   10:02 AM 12/16/2021    2:38 PM 10/14/2021   12:29 PM  CBC  WBC 4.0 - 10.5 K/uL 9.0  8.8  6.7   Hemoglobin 13.0 - 17.0 g/dL 14.9  15.1  9.1   Hematocrit 39.0 - 52.0 % 45.9  46.6  27.6   Platelets 150 - 400 K/uL 357  367  326       Latest Ref Rng & Units 12/24/2021   10:02 AM 12/16/2021    2:38 PM 11/25/2021   11:45 AM  CMP  Glucose 70 - 99 mg/dL 206  159  212   BUN 8 - 23 mg/dL 43  42  46   Creatinine 0.61 - 1.24 mg/dL 1.23  1.18  1.24   Sodium 135 - 145 mmol/L 133  139  134   Potassium 3.5 - 5.1 mmol/L 4.4  4.5  4.1   Chloride 98 - 111 mmol/L 91  94  90   CO2 22 - 32 mmol/L 30  26  26    Calcium 8.9 - 10.3 mg/dL 9.8  10.0  10.1   Total Protein 6.5 - 8.1 g/dL 8.2   7.2   Total Bilirubin 0.3 - 1.2 mg/dL 0.7   <0.2   Alkaline Phos 38 - 126 U/L 155   171   AST 15 - 41 U/L 24   22   ALT 0 - 44 U/L 28   20     DIAGNOSTIC IMAGING:  I have independently reviewed the scans and discussed with the patient. CT Chest W Contrast  Result Date: 12/25/2021 CLINICAL DATA:  Non-small-cell lung cancer. Restaging. * Tracking Code: BO * EXAM: CT CHEST WITH CONTRAST TECHNIQUE: Multidetector CT imaging of the chest was performed during intravenous contrast  administration. RADIATION DOSE REDUCTION: This exam was performed according to the departmental dose-optimization program which includes automated exposure control, adjustment of the mA and/or kV according to patient size and/or use of iterative reconstruction technique. CONTRAST:  25m OMNIPAQUE IOHEXOL 300 MG/ML  SOLN COMPARISON:  06/19/2021 FINDINGS: Cardiovascular: The heart size is normal. No substantial pericardial effusion. Coronary artery calcification is evident. Moderate atherosclerotic calcification is noted in the wall of the thoracic aorta. Left Port-A-Cath tip is in the mid SVC. Mediastinum/Nodes: No mediastinal lymphadenopathy. There is no hilar lymphadenopathy. The esophagus has normal imaging  features. There is no axillary lymphadenopathy. Lungs/Pleura: Centrilobular emphysema noted. Similar appearance post treatment scarring in the posterior left upper lobe. 9 mm left upper lobe nodule measured previously is 8 mm today on image 25/4. Index nodule measured previously in the left lower lobe at 7 mm is 7 mm again today on 86/4. Architectural distortion and scarring in the suprahilar right lung and retro hilar right lower lobe is stable. 8 mm posterior right upper lobe nodule on image 52/4 has increased in size from 5 mm previously. No evidence for pleural effusion. Upper Abdomen: Unremarkable. Musculoskeletal: No worrisome lytic or sclerotic osseous abnormality. IMPRESSION: 1. 8 mm posterior right upper lobe pulmonary nodule has increased in size from 5 mm previously. Finding is concerning for metastatic progression. Close follow-up recommended. 2. Other scattered bilateral pulmonary nodules are stable in the interval. 3. Similar appearance of post treatment scarring in both lungs. 4. Emphysema (ICD10-J43.9) and Aortic Atherosclerosis (ICD10-170.0) Electronically Signed   By: Misty Stanley M.D.   On: 12/25/2021 10:29   DG Hand Complete Right  Result Date: 12/18/2021 CLINICAL DATA:  Dog bite 2  weeks ago.  Pain and swelling EXAM: RIGHT HAND - COMPLETE 3+ VIEW COMPARISON:  Right wrist 08/10/2016 FINDINGS: Dorsal soft tissue swelling over the metacarpophalangeal joint. No foreign body in the soft tissues Negative for fracture. No evidence of osteomyelitis. Mild degenerative change in the MTP joints. IMPRESSION: Mild soft tissue swelling dorsal to the metatarsal phalangeal joints. No foreign body. No fracture or osteomyelitis. Osteoarthritis Electronically Signed   By: Franchot Gallo M.D.   On: 12/18/2021 16:16   VAS [place] LOWER EXTREMITY ARTERIAL DUPLEX  Result Date: 12/10/2021 LOWER EXTREMITY ARTERIAL DUPLEX STUDY Patient Name:  ARAGORN RECKER  Date of Exam:   12/10/2021 Medical Rec #: [id number]         Accession #:    [id number] Date of Birth: November 26, 1945         Patient Gender: M Patient Age:   23 years Exam Location:  [facility] Procedure:      VAS [place] LOWER EXTREMITY ARTERIAL DUPLEX Referring Phys: Monica Martinez --------------------------------------------------------------------------------  Indications: Claudication, and peripheral artery disease. High Risk Factors: Hypertension, Diabetes, current smoker. COPD, Atrial flutter  Vascular Interventions: 08/20/20: Right CIA angioplasty and stent, CFA                         endarterectomy and profundoplasty. 05/30/21 Right                         proximal SFA, distal SFA and above knee popliteal                         angioplasty Current ABI:            Right ABI 0.84 left 0.98 Comparison Study: No significant change since exam of 07/02/2021 Performing Technologist: Alvia Grove RVT  Examination Guidelines: A complete evaluation includes B-mode imaging, spectral Doppler, color Doppler, and power Doppler as needed of all accessible portions of each vessel. Bilateral testing is considered an integral part of a complete examination. Limited examinations for reoccurring indications may be performed as noted.   +-----------+--------+-----+---------------+----------+--------+ RIGHT      PSV cm/sRatioStenosis       Waveform  Comments +-----------+--------+-----+---------------+----------+--------+ CFA Prox   178          30-49% stenosisbiphasic           +-----------+--------+-----+---------------+----------+--------+  CFA Distal 131                         biphasic           +-----------+--------+-----+---------------+----------+--------+ DFA        74                          biphasic           +-----------+--------+-----+---------------+----------+--------+ SFA Prox   96                          biphasic           +-----------+--------+-----+---------------+----------+--------+ SFA Mid    139                         biphasic           +-----------+--------+-----+---------------+----------+--------+ SFA Distal 224          50-74% stenosisbiphasic           +-----------+--------+-----+---------------+----------+--------+ POP Prox   53                          biphasic           +-----------+--------+-----+---------------+----------+--------+ POP Mid    41                          biphasic           +-----------+--------+-----+---------------+----------+--------+ POP Distal 51                          biphasic           +-----------+--------+-----+---------------+----------+--------+ TP Trunk   47                          biphasic           +-----------+--------+-----+---------------+----------+--------+ ATA Distal 19                          monophasic         +-----------+--------+-----+---------------+----------+--------+ PTA Distal 23                          monophasic         +-----------+--------+-----+---------------+----------+--------+ PERO Distal7                           monophasicdampened +-----------+--------+-----+---------------+----------+--------+  Right Stent(s):  +---------------+--------+--------------+----------+--------+ CIA stent      PSV cm/sStenosis      Waveform  Comments +---------------+--------+--------------+----------+--------+ Prox to Stent  49                    biphasic           +---------------+--------+--------------+----------+--------+ Proximal Stent 226     1-49% stenosismonophasicbrisk    +---------------+--------+--------------+----------+--------+ Mid Stent      154                                      +---------------+--------+--------------+----------+--------+ Distal Stent   125  biphasic           +---------------+--------+--------------+----------+--------+ Distal to [id number]                   biphasic           +---------------+--------+--------------+----------+--------+    Summary: Right: 30-49% stenosis noted in the common femoral artery. 50-74% stenosis noted in the superficial femoral artery and/or popliteal artery. Probable tibial artery occlusive disease. Patent right Common iliac artery stent with increased velocity in the proximal segment.  See table(s) above for measurements and observations. Electronically signed by Monica Martinez MD on 12/10/2021 at 10:07:50 AM.    Final    VAS [place] ABI WITH/WO TBI  Result Date: 12/10/2021  LOWER EXTREMITY DOPPLER STUDY Patient Name:  KAMEN HANKEN  Date of Exam:   12/10/2021 Medical Rec #: [id number]         Accession #:    [id number] Date of Birth: 11/23/1945         Patient Gender: M Patient Age:   89 years Exam Location:  [facility] Procedure:      VAS [place] ABI WITH/WO TBI Referring Phys: Monica Martinez --------------------------------------------------------------------------------  Indications: Claudication, and peripheral artery disease. High Risk Factors: Hypertension, hyperlipidemia, current smoker.  Vascular Interventions: 08/20/20: Right CIA angioplasty and stent, CFA                         endarterectomy and  profundoplasty.                         05/30/21 Right proximal SFA, distal SFA and above knee                         popliteal angioplasty Performing Technologist: Alvia Grove RVT  Examination Guidelines: A complete evaluation includes at minimum, Doppler waveform signals and systolic blood pressure reading at the level of bilateral brachial, anterior tibial, and posterior tibial arteries, when vessel segments are accessible. Bilateral testing is considered an integral part of a complete examination. Photoelectric Plethysmograph (PPG) waveforms and toe systolic pressure readings are included as required and additional duplex testing as needed. Limited examinations for reoccurring indications may be performed as noted.  ABI Findings: +---------+------------------+-----+----------+--------+ Right    Rt Pressure (mmHg)IndexWaveform  Comment  +---------+------------------+-----+----------+--------+ Brachial 115                                       +---------+------------------+-----+----------+--------+ PTA      90                0.70 monophasicbrisk    +---------+------------------+-----+----------+--------+ DP       109               0.84 biphasic           +---------+------------------+-----+----------+--------+ Great Toe80                0.62 Abnormal           +---------+------------------+-----+----------+--------+ +---------+------------------+-----+----------+-------+ Left     Lt Pressure (mmHg)IndexWaveform  Comment +---------+------------------+-----+----------+-------+ Brachial 129                                      +---------+------------------+-----+----------+-------+ PTA      127  0.98 biphasic          +---------+------------------+-----+----------+-------+ DP       115               0.89 monophasic        +---------+------------------+-----+----------+-------+ Great Toe79                0.61 Abnormal           +---------+------------------+-----+----------+-------+ +-------+-----------+-----------+------------+------------+ ABI/TBIToday's ABIToday's TBIPrevious ABIPrevious TBI +-------+-----------+-----------+------------+------------+ Right  0.84       0.62       0.82        0.63         +-------+-----------+-----------+------------+------------+ Left   0.98       0.61       0.89        0.61         +-------+-----------+-----------+------------+------------+  Left ABIs appear increased.  Summary: Right: Resting right ankle-brachial index indicates mild right lower extremity arterial disease. The right toe-brachial index is abnormal. Left: Resting left ankle-brachial index is within normal range. The left toe-brachial index is abnormal. *See table(s) above for measurements and observations.  Electronically signed by Monica Martinez MD on 12/10/2021 at 10:07:14 AM.    Final      ASSESSMENT:  1.  Stage IIIa (T1b N2 cM0) NSCLC, favoring adenocarcinoma: -Chemo XRT with weekly carboplatin and paclitaxel completed on 08/20/2016. -Consolidation durvalumab from 09/26/2016 through 09/25/2017. -Reviewed CT chest on 11/07/2019 which showed increasing size of the upper lobe pulmonary nodules with no new nodule in the left upper lobe suspicious for meta stasis.  Also increasing confluent soft tissue in the right suprahilar region, thought to be postradiation changes. -PET scan on 11/21/2019 showed suspected radiation changes in the right paramediastinal/perihilar region although mild hypermetabolism is present in the right retroareolar region.SUV 2.5.  While progressive soft tissue is present in this region, this hypermetabolism remains nonfocal and favors radiation changes.  7 mm central left upper lobe nodule max SUV 1.4.  9 mm aggregate nodule in the lateral right upper lobe max SUV 2.4.  These are minimally progressive from more remote prior scans.  6 mm left lower lobe nodule unchanged.  No hypermetabolic  thoracic adenopathy. -CT chest on 03/23/2020 showed many of the lung nodules are stable.  Couple of nodules have grown by few millimeters.  No new areas seen. - SBRT to the right and left lung lesions, 54 Gray in 3 fractions from 09/10/2020 through 09/17/2020.   2.  Normocytic to macrocytic anemia: - Found to have hemoglobin 6.3 on 10/09/2020 and received 2 units PRBC while hospitalized.  I have reviewed hospitalization records. - EGD on 09/06/2020 with tiny gastric ulcer without bleeding or stigmata. - Blood loss this year most likely postoperative in nature (urological surgery in February and vascular surgery in May).   PLAN:  1.  Stage IIIa (T1b N2 cM0) NSCLC, favoring adenocarcinoma: - Reviewed CT chest from 12/24/2021.  8 mm posterior right upper lobe lung nodule has increased from 5 mm previously on 06/19/2021.  Rest of the lung nodules are stable.  No new areas were seen. - Recommend follow-up CT scan in 4 months.   2.  Hypothyroidism: - Can continue Synthroid daily.   3.  Difficulty staying asleep: - Continue temazepam as needed.  4.  Normocytic to macrocytic anemia: - Ferritin was 74 and percent saturation 21 on 12/24/2021 with hemoglobin 14.9. - Continue iron tablet daily.     Orders placed this encounter:  Orders Placed This Encounter  Procedures   CT Chest W Contrast   CBC with Differential   Comprehensive metabolic panel   Ferritin   Iron and TIBC ([place] [facility] ONLY)      Derek Jack, MD [place] [phone]

## 2021-12-31 DIAGNOSIS — C3491 Malignant neoplasm of unspecified part of right bronchus or lung: Secondary | ICD-10-CM | POA: Diagnosis not present

## 2021-12-31 DIAGNOSIS — J9611 Chronic respiratory failure with hypoxia: Secondary | ICD-10-CM | POA: Diagnosis not present

## 2021-12-31 DIAGNOSIS — I5032 Chronic diastolic (congestive) heart failure: Secondary | ICD-10-CM | POA: Diagnosis not present

## 2021-12-31 DIAGNOSIS — J449 Chronic obstructive pulmonary disease, unspecified: Secondary | ICD-10-CM | POA: Diagnosis not present

## 2021-12-31 DIAGNOSIS — E1159 Type 2 diabetes mellitus with other circulatory complications: Secondary | ICD-10-CM | POA: Diagnosis not present

## 2021-12-31 DIAGNOSIS — I152 Hypertension secondary to endocrine disorders: Secondary | ICD-10-CM | POA: Diagnosis not present

## 2022-01-02 ENCOUNTER — Telehealth: Payer: Self-pay | Admitting: *Deleted

## 2022-01-02 NOTE — Telephone Encounter (Signed)
Left message to call back.  Procedure Oct 9 new arrival time 8:30 am.

## 2022-01-03 ENCOUNTER — Telehealth: Payer: Self-pay | Admitting: Cardiovascular Disease

## 2022-01-03 NOTE — Telephone Encounter (Signed)
Patient returned RN's call. 

## 2022-01-03 NOTE — Telephone Encounter (Signed)
Spoke to patient stated he was calling Dr.Lambert's RN back.After reviewing chart advised new arrival time for Aflutter ablation 10/9 at 8:30 am.I will make Dr.Lambert's RN aware.

## 2022-01-06 NOTE — Telephone Encounter (Signed)
See other phone encounter.  

## 2022-01-09 DIAGNOSIS — I152 Hypertension secondary to endocrine disorders: Secondary | ICD-10-CM | POA: Diagnosis not present

## 2022-01-09 DIAGNOSIS — C3491 Malignant neoplasm of unspecified part of right bronchus or lung: Secondary | ICD-10-CM | POA: Diagnosis not present

## 2022-01-09 DIAGNOSIS — I5032 Chronic diastolic (congestive) heart failure: Secondary | ICD-10-CM | POA: Diagnosis not present

## 2022-01-09 DIAGNOSIS — J9611 Chronic respiratory failure with hypoxia: Secondary | ICD-10-CM | POA: Diagnosis not present

## 2022-01-09 DIAGNOSIS — E1159 Type 2 diabetes mellitus with other circulatory complications: Secondary | ICD-10-CM | POA: Diagnosis not present

## 2022-01-09 DIAGNOSIS — J449 Chronic obstructive pulmonary disease, unspecified: Secondary | ICD-10-CM | POA: Diagnosis not present

## 2022-01-10 NOTE — Pre-Procedure Instructions (Signed)
Attempted to call patient regarding procedure instructions.  No answer

## 2022-01-13 ENCOUNTER — Encounter (HOSPITAL_COMMUNITY): Payer: Self-pay | Admitting: Cardiology

## 2022-01-13 ENCOUNTER — Ambulatory Visit (HOSPITAL_COMMUNITY)
Admission: RE | Disposition: A | Discharge status: Home / Self Care | Payer: Self-pay | Source: Home / Self Care | Attending: Cardiology

## 2022-01-13 ENCOUNTER — Ambulatory Visit (HOSPITAL_BASED_OUTPATIENT_CLINIC_OR_DEPARTMENT_OTHER): Payer: Medicare Other | Admitting: Anesthesiology

## 2022-01-13 ENCOUNTER — Ambulatory Visit (HOSPITAL_COMMUNITY): Payer: Medicare Other | Admitting: Anesthesiology

## 2022-01-13 ENCOUNTER — Other Ambulatory Visit: Payer: Self-pay

## 2022-01-13 ENCOUNTER — Ambulatory Visit (HOSPITAL_COMMUNITY)
Admission: RE | Admit: 2022-01-13 | Discharge: 2022-01-13 | Disposition: A | Discharge status: Home / Self Care | Payer: Medicare Other | Attending: Cardiology | Admitting: Cardiology

## 2022-01-13 DIAGNOSIS — I4892 Unspecified atrial flutter: Secondary | ICD-10-CM

## 2022-01-13 DIAGNOSIS — I5032 Chronic diastolic (congestive) heart failure: Secondary | ICD-10-CM | POA: Diagnosis not present

## 2022-01-13 DIAGNOSIS — I110 Hypertensive heart disease with heart failure: Secondary | ICD-10-CM

## 2022-01-13 DIAGNOSIS — J449 Chronic obstructive pulmonary disease, unspecified: Secondary | ICD-10-CM | POA: Diagnosis not present

## 2022-01-13 DIAGNOSIS — Z85118 Personal history of other malignant neoplasm of bronchus and lung: Secondary | ICD-10-CM | POA: Insufficient documentation

## 2022-01-13 DIAGNOSIS — I483 Typical atrial flutter: Secondary | ICD-10-CM | POA: Insufficient documentation

## 2022-01-13 DIAGNOSIS — Z7984 Long term (current) use of oral hypoglycemic drugs: Secondary | ICD-10-CM | POA: Diagnosis not present

## 2022-01-13 DIAGNOSIS — Z9989 Dependence on other enabling machines and devices: Secondary | ICD-10-CM | POA: Diagnosis not present

## 2022-01-13 DIAGNOSIS — Z923 Personal history of irradiation: Secondary | ICD-10-CM | POA: Diagnosis not present

## 2022-01-13 DIAGNOSIS — G4733 Obstructive sleep apnea (adult) (pediatric): Secondary | ICD-10-CM | POA: Diagnosis not present

## 2022-01-13 DIAGNOSIS — I739 Peripheral vascular disease, unspecified: Secondary | ICD-10-CM | POA: Diagnosis not present

## 2022-01-13 DIAGNOSIS — Z9221 Personal history of antineoplastic chemotherapy: Secondary | ICD-10-CM | POA: Insufficient documentation

## 2022-01-13 DIAGNOSIS — F17210 Nicotine dependence, cigarettes, uncomplicated: Secondary | ICD-10-CM | POA: Diagnosis not present

## 2022-01-13 DIAGNOSIS — E1151 Type 2 diabetes mellitus with diabetic peripheral angiopathy without gangrene: Secondary | ICD-10-CM | POA: Diagnosis not present

## 2022-01-13 DIAGNOSIS — I509 Heart failure, unspecified: Secondary | ICD-10-CM

## 2022-01-13 DIAGNOSIS — E039 Hypothyroidism, unspecified: Secondary | ICD-10-CM | POA: Diagnosis not present

## 2022-01-13 HISTORY — PX: A-FLUTTER ABLATION: EP1230

## 2022-01-13 LAB — GLUCOSE, CAPILLARY
Glucose-Capillary: 239 mg/dL — ABNORMAL HIGH (ref 70–99)
Glucose-Capillary: 179 mg/dL — ABNORMAL HIGH (ref 70–99)

## 2022-01-13 SURGERY — A-FLUTTER ABLATION
Anesthesia: General

## 2022-01-13 MED ORDER — PHENYLEPHRINE HCL-NACL 20-0.9 MG/250ML-% IV SOLN
INTRAVENOUS | Status: DC | PRN
  Administered 2022-01-13: 40 ug/min via INTRAVENOUS

## 2022-01-13 MED ORDER — ACETAMINOPHEN 325 MG PO TABS: 650.0000 mg | ORAL_TABLET | ORAL | Status: DC | PRN

## 2022-01-13 MED ORDER — HEPARIN SODIUM (PORCINE) 1000 UNIT/ML IJ SOLN
INTRAMUSCULAR | Status: AC
  Filled 2022-01-13: qty 10

## 2022-01-13 MED ORDER — PHENYLEPHRINE 80 MCG/ML (10ML) SYRINGE FOR IV PUSH (FOR BLOOD PRESSURE SUPPORT)
PREFILLED_SYRINGE | INTRAVENOUS | Status: DC | PRN
  Administered 2022-01-13: 160 ug via INTRAVENOUS

## 2022-01-13 MED ORDER — LIDOCAINE 2% (20 MG/ML) 5 ML SYRINGE
INTRAMUSCULAR | Status: DC | PRN
  Administered 2022-01-13: 60 mg via INTRAVENOUS

## 2022-01-13 MED ORDER — SODIUM CHLORIDE 0.9 % IV SOLN: 250.0000 mL | INTRAVENOUS | Status: DC | PRN

## 2022-01-13 MED ORDER — APIXABAN 5 MG PO TABS
5.0000 mg | ORAL_TABLET | Freq: Two times a day (BID) | ORAL | Status: DC
  Filled 2022-01-13 (×2): qty 1

## 2022-01-13 MED ORDER — ONDANSETRON HCL 4 MG/2ML IJ SOLN: 4.0000 mg | Freq: Four times a day (QID) | INTRAMUSCULAR | Status: DC | PRN

## 2022-01-13 MED ORDER — FENTANYL CITRATE (PF) 250 MCG/5ML IJ SOLN
INTRAMUSCULAR | Status: DC | PRN
  Administered 2022-01-13 (×2): 50 ug via INTRAVENOUS

## 2022-01-13 MED ORDER — SUCCINYLCHOLINE CHLORIDE 200 MG/10ML IV SOSY
PREFILLED_SYRINGE | INTRAVENOUS | Status: DC | PRN
  Administered 2022-01-13: 140 mg via INTRAVENOUS

## 2022-01-13 MED ORDER — PROPOFOL 10 MG/ML IV BOLUS
INTRAVENOUS | Status: DC | PRN
  Administered 2022-01-13: 120 mg via INTRAVENOUS

## 2022-01-13 MED ORDER — ONDANSETRON HCL 4 MG/2ML IJ SOLN
INTRAMUSCULAR | Status: DC | PRN
  Administered 2022-01-13: 4 mg via INTRAVENOUS

## 2022-01-13 MED ORDER — HEPARIN (PORCINE) IN NACL 1000-0.9 UT/500ML-% IV SOLN
INTRAVENOUS | Status: AC
  Filled 2022-01-13: qty 1000

## 2022-01-13 MED ORDER — SODIUM CHLORIDE 0.9% FLUSH: 3.0000 mL | INTRAVENOUS | Status: DC | PRN

## 2022-01-13 MED ORDER — ROCURONIUM BROMIDE 10 MG/ML (PF) SYRINGE
PREFILLED_SYRINGE | INTRAVENOUS | Status: DC | PRN
  Administered 2022-01-13: 50 mg via INTRAVENOUS

## 2022-01-13 MED ORDER — SODIUM CHLORIDE 0.9 % IV SOLN: INTRAVENOUS | Status: DC

## 2022-01-13 MED ORDER — ACETAMINOPHEN 500 MG PO TABS
1000.0000 mg | ORAL_TABLET | Freq: Once | ORAL | Status: AC
  Administered 2022-01-13: 1000 mg via ORAL
  Filled 2022-01-13: qty 2

## 2022-01-13 MED ORDER — FENTANYL CITRATE (PF) 100 MCG/2ML IJ SOLN
INTRAMUSCULAR | Status: AC
  Filled 2022-01-13: qty 2

## 2022-01-13 MED ORDER — SODIUM CHLORIDE 0.9% FLUSH: 3.0000 mL | Freq: Two times a day (BID) | INTRAVENOUS | Status: DC

## 2022-01-13 MED ORDER — HEPARIN SODIUM (PORCINE) 1000 UNIT/ML IJ SOLN
INTRAMUSCULAR | Status: DC | PRN
  Administered 2022-01-13: 1000 [IU] via INTRAVENOUS

## 2022-01-13 MED ORDER — HEPARIN (PORCINE) IN NACL 1000-0.9 UT/500ML-% IV SOLN
INTRAVENOUS | Status: DC | PRN
  Administered 2022-01-13 (×2): 500 mL

## 2022-01-13 MED ORDER — SUGAMMADEX SODIUM 200 MG/2ML IV SOLN
INTRAVENOUS | Status: DC | PRN
  Administered 2022-01-13: 200 mg via INTRAVENOUS
  Administered 2022-01-13: 100 mg via INTRAVENOUS

## 2022-01-13 SURGICAL SUPPLY — 14 items
CATH 8FR REPROCESSED SOUNDSTAR (CATHETERS) ×1 IMPLANT
CATH 8FR SOUNDSTAR REPROCESSED (CATHETERS) IMPLANT
CATH SMTCH THERMOCOOL SF FJ (CATHETERS) IMPLANT
CATH WEB BI DIR CSDF CRV REPRO (CATHETERS) IMPLANT
CLOSURE PERCLOSE PROSTYLE (VASCULAR PRODUCTS) IMPLANT
PACK EP LATEX FREE (CUSTOM PROCEDURE TRAY) ×1
PACK EP LF (CUSTOM PROCEDURE TRAY) ×1 IMPLANT
PAD DEFIB RADIO PHYSIO CONN (PAD) ×1 IMPLANT
PATCH CARTO3 (PAD) IMPLANT
SHEATH CARTO VIZIGO SM CVD (SHEATH) IMPLANT
SHEATH PINNACLE 8F 10CM (SHEATH) IMPLANT
SHEATH PINNACLE 9F 10CM (SHEATH) IMPLANT
SHEATH PROBE COVER 6X72 (BAG) IMPLANT
TUBING SMART ABLATE COOLFLOW (TUBING) IMPLANT

## 2022-01-13 NOTE — Anesthesia Preprocedure Evaluation (Addendum)
Anesthesia Evaluation  Patient identified by MRN, date of birth, ID band Patient awake    Reviewed: Allergy & Precautions, NPO status , Patient's Chart, lab work & pertinent test results  History of Anesthesia Complications Negative for: history of anesthetic complications  Airway Mallampati: II  TM Distance: >3 FB Neck ROM: Full    Dental  (+) Upper Dentures, Lower Dentures   Pulmonary asthma , sleep apnea and Continuous Positive Airway Pressure Ventilation , COPD,  COPD inhaler, Current Smoker and Patient abstained from smoking.,   Lung cancer    Pulmonary exam normal        Cardiovascular hypertension, Pt. on medications + Peripheral Vascular Disease and +CHF  Normal cardiovascular exam+ dysrhythmias Atrial Fibrillation    '23 TTE - EF 55 to 60%. There is mildly elevated pulmonary artery systolic pressure. Left atrial size was mildly dilated. Aortic valve regurgitation is trivial. Aortic valve sclerosis/calcification is present, without any evidence of aortic stenosis.     Neuro/Psych PSYCHIATRIC DISORDERS Anxiety Depression negative neurological ROS     GI/Hepatic Neg liver ROS, PUD, GERD  Medicated and Controlled,  Endo/Other  diabetes, Type 2, Oral Hypoglycemic AgentsHypothyroidism   Renal/GU negative Renal ROS     Musculoskeletal  (+) Arthritis ,   Abdominal   Peds  Hematology  On eliquis    Anesthesia Other Findings   Reproductive/Obstetrics                           Anesthesia Physical Anesthesia Plan  ASA: 3  Anesthesia Plan: General   Post-op Pain Management: Tylenol PO (pre-op)* and Minimal or no pain anticipated   Induction: Intravenous and Rapid sequence  PONV Risk Score and Plan: 1 and Treatment may vary due to age or medical condition, Ondansetron and Dexamethasone  Airway Management Planned: Oral ETT  Additional Equipment: None  Intra-op Plan:    Post-operative Plan: Extubation in OR  Informed Consent: I have reviewed the patients History and Physical, chart, labs and discussed the procedure including the risks, benefits and alternatives for the proposed anesthesia with the patient or authorized representative who has indicated his/her understanding and acceptance.     Dental advisory given  Plan Discussed with: CRNA and Anesthesiologist  Anesthesia Plan Comments:       Anesthesia Quick Evaluation

## 2022-01-13 NOTE — Transfer of Care (Signed)
Immediate Anesthesia Transfer of Care Note  Patient: John [name]  Procedure(s) Performed: A-FLUTTER ABLATION  Patient Location: Cath Lab  Anesthesia Type:General  Level of Consciousness: drowsy  Airway & Oxygen Therapy: Patient Spontanous Breathing  Post-op Assessment: Report given to RN and Post -op Vital signs reviewed and stable  Post vital signs: Reviewed and stable  Last Vitals:  Vitals Value Taken Time  BP 149/62 01/13/22 1134  Temp    Pulse 107 01/13/22 1135  Resp 17 01/13/22 1135  SpO2 90 % 01/13/22 1135  Vitals shown include unvalidated device data.  Last Pain:  Vitals:   01/13/22 0910  TempSrc:   PainSc: 0-No pain         Complications: There were no known notable events for this encounter.

## 2022-01-13 NOTE — Interval H&P Note (Signed)
History and Physical Interval Note:  01/13/2022 9:54 AM  John [name]  has presented today for surgery, with the diagnosis of aflutter.  The various methods of treatment have been discussed with the patient and family. After consideration of risks, benefits and other options for treatment, the patient has consented to  Procedure(s): A-FLUTTER ABLATION (N/A) as a surgical intervention.  The patient's history has been reviewed, patient examined, no change in status, stable for surgery.  I have reviewed the patient's chart and labs.  Questions were answered to the patient's satisfaction.     Rainn Zupko T Alexzandrea [place]

## 2022-01-13 NOTE — Anesthesia Postprocedure Evaluation (Signed)
Anesthesia Post Note  Patient: John [name]  Procedure(s) Performed: A-FLUTTER ABLATION     Patient location during evaluation: PACU Anesthesia Type: General Level of consciousness: awake and alert Pain management: pain level controlled Vital Signs Assessment: post-procedure vital signs reviewed and stable Respiratory status: spontaneous breathing, nonlabored ventilation and respiratory function stable Cardiovascular status: stable and blood pressure returned to baseline Anesthetic complications: no   There were no known notable events for this encounter.  Last Vitals:  Vitals:   01/13/22 1205 01/13/22 1210  BP: 119/63 (!) 132/53  Pulse: (!) 103 (!) 103  Resp: 12 11  Temp:    SpO2: 96% 96%    Last Pain:  Vitals:   01/13/22 1224  TempSrc:   PainSc: 0-No pain                 Audry Pili

## 2022-01-13 NOTE — Discharge Instructions (Signed)

## 2022-01-13 NOTE — Anesthesia Procedure Notes (Signed)
Procedure Name: Intubation Date/Time: 01/13/2022 10:24 AM  Performed by: Erick Colace, CRNAPre-anesthesia Checklist: Patient identified, Emergency Drugs available, Suction available and Patient being monitored Patient Re-evaluated:Patient Re-evaluated prior to induction Oxygen Delivery Method: Circle system utilized Preoxygenation: Pre-oxygenation with 100% oxygen Induction Type: IV induction, Rapid sequence and Cricoid Pressure applied Ventilation: Mask ventilation without difficulty Laryngoscope Size: Mac and 4 Grade View: Grade I Tube type: Oral Tube size: 7.5 mm Number of attempts: 1 Airway Equipment and Method: Stylet and Oral airway Placement Confirmation: ETT inserted through vocal cords under direct vision, positive ETCO2 and breath sounds checked- equal and bilateral Secured at: 22 cm Tube secured with: Tape Dental Injury: Teeth and Oropharynx as per pre-operative assessment

## 2022-01-14 ENCOUNTER — Encounter (HOSPITAL_COMMUNITY): Payer: Self-pay | Admitting: Cardiology

## 2022-01-14 ENCOUNTER — Ambulatory Visit: Payer: Medicare Other | Admitting: Family

## 2022-01-14 MED FILL — Fentanyl Citrate Preservative Free (PF) Inj 100 MCG/2ML: INTRAMUSCULAR | Qty: 2 | Status: AC

## 2022-01-17 DIAGNOSIS — I5032 Chronic diastolic (congestive) heart failure: Secondary | ICD-10-CM | POA: Diagnosis not present

## 2022-01-17 DIAGNOSIS — C3491 Malignant neoplasm of unspecified part of right bronchus or lung: Secondary | ICD-10-CM | POA: Diagnosis not present

## 2022-01-17 DIAGNOSIS — J9611 Chronic respiratory failure with hypoxia: Secondary | ICD-10-CM | POA: Diagnosis not present

## 2022-01-17 DIAGNOSIS — J449 Chronic obstructive pulmonary disease, unspecified: Secondary | ICD-10-CM | POA: Diagnosis not present

## 2022-01-17 DIAGNOSIS — E1159 Type 2 diabetes mellitus with other circulatory complications: Secondary | ICD-10-CM | POA: Diagnosis not present

## 2022-01-17 DIAGNOSIS — I152 Hypertension secondary to endocrine disorders: Secondary | ICD-10-CM | POA: Diagnosis not present

## 2022-01-21 DIAGNOSIS — E1142 Type 2 diabetes mellitus with diabetic polyneuropathy: Secondary | ICD-10-CM | POA: Diagnosis not present

## 2022-01-21 DIAGNOSIS — B351 Tinea unguium: Secondary | ICD-10-CM | POA: Diagnosis not present

## 2022-01-21 DIAGNOSIS — M79676 Pain in unspecified toe(s): Secondary | ICD-10-CM | POA: Diagnosis not present

## 2022-01-21 DIAGNOSIS — L84 Corns and callosities: Secondary | ICD-10-CM | POA: Diagnosis not present

## 2022-01-22 DIAGNOSIS — E1159 Type 2 diabetes mellitus with other circulatory complications: Secondary | ICD-10-CM | POA: Diagnosis not present

## 2022-01-22 DIAGNOSIS — J449 Chronic obstructive pulmonary disease, unspecified: Secondary | ICD-10-CM | POA: Diagnosis not present

## 2022-01-22 DIAGNOSIS — I5032 Chronic diastolic (congestive) heart failure: Secondary | ICD-10-CM | POA: Diagnosis not present

## 2022-01-22 DIAGNOSIS — J9611 Chronic respiratory failure with hypoxia: Secondary | ICD-10-CM | POA: Diagnosis not present

## 2022-01-22 DIAGNOSIS — I152 Hypertension secondary to endocrine disorders: Secondary | ICD-10-CM | POA: Diagnosis not present

## 2022-01-22 DIAGNOSIS — C3491 Malignant neoplasm of unspecified part of right bronchus or lung: Secondary | ICD-10-CM | POA: Diagnosis not present

## 2022-01-23 ENCOUNTER — Other Ambulatory Visit: Payer: Self-pay | Admitting: Family

## 2022-01-23 DIAGNOSIS — E1169 Type 2 diabetes mellitus with other specified complication: Secondary | ICD-10-CM

## 2022-01-28 ENCOUNTER — Encounter: Payer: Self-pay | Admitting: Family

## 2022-01-28 ENCOUNTER — Ambulatory Visit (HOSPITAL_COMMUNITY)
Admission: RE | Admit: 2022-01-28 | Discharge: 2022-01-28 | Disposition: A | Discharge status: Home / Self Care | Payer: Medicare Other | Source: Ambulatory Visit | Attending: Family | Admitting: Family

## 2022-01-28 ENCOUNTER — Ambulatory Visit (INDEPENDENT_AMBULATORY_CARE_PROVIDER_SITE_OTHER): Payer: Medicare Other | Admitting: Family

## 2022-01-28 VITALS — BP 130/70 | HR 111 | Temp 97.5°F | Ht 66.0 in | Wt 149.0 lb

## 2022-01-28 DIAGNOSIS — J449 Chronic obstructive pulmonary disease, unspecified: Secondary | ICD-10-CM

## 2022-01-28 DIAGNOSIS — Z7901 Long term (current) use of anticoagulants: Secondary | ICD-10-CM | POA: Insufficient documentation

## 2022-01-28 DIAGNOSIS — J9611 Chronic respiratory failure with hypoxia: Secondary | ICD-10-CM | POA: Diagnosis not present

## 2022-01-28 DIAGNOSIS — E1159 Type 2 diabetes mellitus with other circulatory complications: Secondary | ICD-10-CM | POA: Diagnosis not present

## 2022-01-28 DIAGNOSIS — W06XXXA Fall from bed, initial encounter: Secondary | ICD-10-CM | POA: Diagnosis not present

## 2022-01-28 DIAGNOSIS — F5101 Primary insomnia: Secondary | ICD-10-CM

## 2022-01-28 DIAGNOSIS — I6523 Occlusion and stenosis of bilateral carotid arteries: Secondary | ICD-10-CM | POA: Diagnosis not present

## 2022-01-28 DIAGNOSIS — Z23 Encounter for immunization: Secondary | ICD-10-CM

## 2022-01-28 DIAGNOSIS — E039 Hypothyroidism, unspecified: Secondary | ICD-10-CM

## 2022-01-28 DIAGNOSIS — I739 Peripheral vascular disease, unspecified: Secondary | ICD-10-CM

## 2022-01-28 DIAGNOSIS — S0990XA Unspecified injury of head, initial encounter: Secondary | ICD-10-CM | POA: Insufficient documentation

## 2022-01-28 DIAGNOSIS — G9389 Other specified disorders of brain: Secondary | ICD-10-CM | POA: Diagnosis not present

## 2022-01-28 DIAGNOSIS — I152 Hypertension secondary to endocrine disorders: Secondary | ICD-10-CM

## 2022-01-28 DIAGNOSIS — E1142 Type 2 diabetes mellitus with diabetic polyneuropathy: Secondary | ICD-10-CM | POA: Diagnosis not present

## 2022-01-28 DIAGNOSIS — C3491 Malignant neoplasm of unspecified part of right bronchus or lung: Secondary | ICD-10-CM

## 2022-01-28 DIAGNOSIS — F17210 Nicotine dependence, cigarettes, uncomplicated: Secondary | ICD-10-CM | POA: Diagnosis not present

## 2022-01-28 DIAGNOSIS — M159 Polyosteoarthritis, unspecified: Secondary | ICD-10-CM | POA: Diagnosis not present

## 2022-01-28 DIAGNOSIS — D689 Coagulation defect, unspecified: Secondary | ICD-10-CM | POA: Diagnosis not present

## 2022-01-28 LAB — BAYER DCA HB A1C WAIVED: HB A1C (BAYER DCA - WAIVED): 7.8 % — ABNORMAL HIGH (ref 4.8–5.6)

## 2022-01-28 NOTE — Progress Notes (Signed)
Subjective:    Patient ID: John [name], male    DOB: 02-11-1946, 76 y.o.   MRN: [id number]  Chief Complaint  Patient presents with   Medical Management of Chronic Issues   Fall    Hit head on table yesterday on blood thinner    Pt presents to the office today for  chronic care follow up. He is followed by Cardiologists  for CHF and A Fib. He had an ablation on 01/13/22. Followed by Oncologists every 6 months for non-small lung cancer. He completed radiation.       01/28/2022   12:20 PM 01/13/2022    8:49 AM 12/30/2021   11:48 AM  Last 3 Weights  Weight (lbs) 149 lb 147 lb 147 lb 11.2 oz  Weight (kg) 67.586 kg 66.679 kg 66.996 kg      He is followed by Pulmonologist every 6 months for COPD. He continues to smoke 1/2 and complaining of increased SOB. Has home oxygen he uses as needed and with his CPAP nightly.  He is followed by Vascular for PAD.    He is followed by Urologists for BPH and Prostate.   He is followed by GI for bleed.    Has OSA and using CPAP nightly.    He reports yesterday he was reaching for a blanket in the middle of the night and fell out of bed and hit his head on the end table. He does take Eliquis. Reports mild headache. Denies any vision, speech, or gait.  Fall The accident occurred 2 days ago. The fall occurred from a bed. There was no blood loss.  Hypertension This is a chronic problem. The problem has been resolved since onset. The problem is controlled. Associated symptoms include blurred vision, malaise/fatigue and shortness of breath. Pertinent negatives include no peripheral edema. Risk factors for coronary artery disease include dyslipidemia, diabetes mellitus, obesity, male gender, sedentary lifestyle and smoking/tobacco exposure. The current treatment provides moderate improvement. Identifiable causes of hypertension include a thyroid problem.  Congestive Heart Failure Presents for follow-up visit. Associated symptoms include edema, fatigue  and shortness of breath. The symptoms have been stable.  Diabetes He presents for his follow-up diabetic visit. He has type 2 diabetes mellitus. Pertinent negatives for hypoglycemia include no nervousness/anxiousness. Associated symptoms include blurred vision, fatigue and foot paresthesias. Diabetic complications include heart disease, nephropathy and peripheral neuropathy. Risk factors for coronary artery disease include dyslipidemia, diabetes mellitus, male sex, hypertension and sedentary lifestyle. He is following a generally healthy diet. His overall blood glucose range is 140-180 mg/dl.  Gastroesophageal Reflux He complains of belching and heartburn. He reports no globus sensation. This is a chronic problem. The current episode started more than 1 year ago. The problem occurs occasionally. Associated symptoms include fatigue. He has tried a PPI for the symptoms. The treatment provided moderate relief.  Hyperlipidemia This is a chronic problem. The current episode started more than 1 year ago. Exacerbating diseases include obesity. Associated symptoms include shortness of breath. Current antihyperlipidemic treatment includes statins. The current treatment provides moderate improvement of lipids. Risk factors for coronary artery disease include dyslipidemia, diabetes mellitus, male sex, hypertension and a sedentary lifestyle.  Thyroid Problem Presents for follow-up visit. Symptoms include constipation, dry skin and fatigue. Patient reports no anxiety. The symptoms have been stable. His past medical history is significant for hyperlipidemia.  Arthritis Presents for follow-up visit. He complains of pain and stiffness. Affected locations include the right knee, left knee, right MCP and left MCP.  His pain is at a severity of 2/10. Associated symptoms include fatigue.  Anemia Presents for follow-up visit. Symptoms include malaise/fatigue.      Review of Systems  Constitutional:  Positive for fatigue  and malaise/fatigue.  Eyes:  Positive for blurred vision.  Respiratory:  Positive for shortness of breath.   Gastrointestinal:  Positive for constipation and heartburn.  Musculoskeletal:  Positive for arthritis and stiffness.  Psychiatric/Behavioral:  The patient is not nervous/anxious.   All other systems reviewed and are negative.      Objective:   Physical Exam Vitals reviewed.  Constitutional:      General: He is not in acute distress.    Appearance: He is well-developed.  HENT:     Head: Normocephalic.     Right Ear: Tympanic membrane normal.     Left Ear: Tympanic membrane normal.  Eyes:     General:        Right eye: No discharge.        Left eye: No discharge.     Pupils: Pupils are equal, round, and reactive to light.  Neck:     Thyroid: No thyromegaly.  Cardiovascular:     Rate and Rhythm: Normal rate and regular rhythm.     Heart sounds: Murmur heard.  Pulmonary:     Effort: Pulmonary effort is normal. No respiratory distress.     Breath sounds: Normal breath sounds. No wheezing.  Abdominal:     General: Bowel sounds are normal. There is no distension.     Palpations: Abdomen is soft.     Tenderness: There is no abdominal tenderness.  Musculoskeletal:        General: No tenderness. Normal range of motion.     Cervical back: Normal range of motion and neck supple.  Skin:    General: Skin is warm and dry.     Findings: No erythema or rash.  Neurological:     Mental Status: He is alert and oriented to person, place, and time.     Cranial Nerves: No cranial nerve deficit.     Deep Tendon Reflexes: Reflexes are normal and symmetric.  Psychiatric:        Behavior: Behavior normal.        Thought Content: Thought content normal.        Judgment: Judgment normal.   Diabetic Foot Exam - Simple   Simple Foot Form Visual Inspection No deformities, no ulcerations, no other skin breakdown bilaterally: Yes Sensation Testing See comments: Yes Pulse  Check Posterior Tibialis and Dorsalis pulse intact bilaterally: Yes Comments Negative monofilament        BP 130/70   Pulse (!) 111   Temp (!) 97.5 F (36.4 C) (Temporal)   Ht 5\' 6"  (1.676 m)   Wt 149 lb (67.6 kg)   BMI 24.05 kg/m      Assessment & Plan:  John [name] comes in today with chief complaint of Medical Management of Chronic Issues and Fall (Hit head on table yesterday on blood thinner )   Diagnosis and orders addressed:  1. Need for immunization against influenza - Flu Vaccine QUAD High Dose(Fluad)  2. Hypertension associated with diabetes ([place])  3. PAD (peripheral artery disease) ([place])  4. Chronic obstructive pulmonary disease, unspecified COPD type ([place])  5. Non-small cell lung cancer, right ([place])  6. Chronic respiratory failure with hypoxia ([facility])  7. Primary osteoarthritis involving multiple joints  8. Type 2 diabetes mellitus with diabetic polyneuropathy, without long-term current use of  insulin ([place])  9. Hypothyroidism, unspecified type  10. Diabetic polyneuropathy associated with type 2 diabetes mellitus ([place])  11. Nicotine dependence, cigarettes, uncomplicated  12. Primary insomnia   13. Fall from bed, initial encounter  14. Injury of head, initial encounter - CT HEAD WO CONTRAST (5MM); Future  15. Anticoagulated - CT HEAD WO CONTRAST (5MM); Future   Labs pending Health Maintenance reviewed Diet and exercise encouraged  Follow up plan: 3 months   Evelina Dun, FNP

## 2022-01-28 NOTE — Patient Instructions (Signed)
Health Maintenance After Age 76 After age 76, you are at a higher risk for certain long-term diseases and infections as well as injuries from falls. Falls are a major cause of broken bones and head injuries in people who are older than age 76. Getting regular preventive care can help to keep you healthy and well. Preventive care includes getting regular testing and making lifestyle changes as recommended by your health care provider. Talk with your health care provider about: Which screenings and tests you should have. A screening is a test that checks for a disease when you have no symptoms. A diet and exercise plan that is right for you. What should I know about screenings and tests to prevent falls? Screening and testing are the best ways to find a health problem early. Early diagnosis and treatment give you the best chance of managing medical conditions that are common after age 76. Certain conditions and lifestyle choices may make you more likely to have a fall. Your health care provider may recommend: Regular vision checks. Poor vision and conditions such as cataracts can make you more likely to have a fall. If you wear glasses, make sure to get your prescription updated if your vision changes. Medicine review. Work with your health care provider to regularly review all of the medicines you are taking, including over-the-counter medicines. Ask your health care provider about any side effects that may make you more likely to have a fall. Tell your health care provider if any medicines that you take make you feel dizzy or sleepy. Strength and balance checks. Your health care provider may recommend certain tests to check your strength and balance while standing, walking, or changing positions. Foot health exam. Foot pain and numbness, as well as not wearing proper footwear, can make you more likely to have a fall. Screenings, including: Osteoporosis screening. Osteoporosis is a condition that causes  the bones to get weaker and break more easily. Blood pressure screening. Blood pressure changes and medicines to control blood pressure can make you feel dizzy. Depression screening. You may be more likely to have a fall if you have a fear of falling, feel depressed, or feel unable to do activities that you used to do. Alcohol use screening. Using too much alcohol can affect your balance and may make you more likely to have a fall. Follow these instructions at home: Lifestyle Do not drink alcohol if: Your health care provider tells you not to drink. If you drink alcohol: Limit how much you have to: 0-1 drink a day for women. 0-2 drinks a day for men. Know how much alcohol is in your drink. In the [place] one drink equals one 12 oz bottle of beer (355 mL), one 5 oz glass of wine (148 mL), or one 1 oz glass of hard liquor (44 mL). Do not use any products that contain nicotine or tobacco. These products include cigarettes, chewing tobacco, and vaping devices, such as e-cigarettes. If you need help quitting, ask your health care provider. Activity  Follow a regular exercise program to stay fit. This will help you maintain your balance. Ask your health care provider what types of exercise are appropriate for you. If you need a cane or walker, use it as recommended by your health care provider. Wear supportive shoes that have nonskid soles. Safety  Remove any tripping hazards, such as rugs, cords, and clutter. Install safety equipment such as grab bars in bathrooms and safety rails on stairs. Keep rooms and walkways   well-lit. General instructions Talk with your health care provider about your risks for falling. Tell your health care provider if: You fall. Be sure to tell your health care provider about all falls, even ones that seem minor. You feel dizzy, tiredness (fatigue), or off-balance. Take over-the-counter and prescription medicines only as told by your health care provider. These include  supplements. Eat a healthy diet and maintain a healthy weight. A healthy diet includes low-fat dairy products, low-fat (lean) meats, and fiber from whole grains, beans, and lots of fruits and vegetables. Stay current with your vaccines. Schedule regular health, dental, and eye exams. Summary Having a healthy lifestyle and getting preventive care can help to protect your health and wellness after age 76. Screening and testing are the best way to find a health problem early and help you avoid having a fall. Early diagnosis and treatment give you the best chance for managing medical conditions that are more common for people who are older than age 76. Falls are a major cause of broken bones and head injuries in people who are older than age 76. Take precautions to prevent a fall at home. Work with your health care provider to learn what changes you can make to improve your health and wellness and to prevent falls. This information is not intended to replace advice given to you by your health care provider. Make sure you discuss any questions you have with your health care provider. Document Revised: 08/13/2020 Document Reviewed: 08/13/2020 Elsevier Patient Education  2023 Elsevier Inc.  

## 2022-01-29 ENCOUNTER — Telehealth: Payer: Self-pay | Admitting: Family

## 2022-01-29 DIAGNOSIS — G4733 Obstructive sleep apnea (adult) (pediatric): Secondary | ICD-10-CM

## 2022-01-29 LAB — CMP14+EGFR
ALT: 14 IU/L (ref 0–44)
AST: 20 IU/L (ref 0–40)
Albumin/Globulin Ratio: 1.6 (ref 1.2–2.2)
Albumin: 4.6 g/dL (ref 3.8–4.8)
Alkaline Phosphatase: 161 IU/L — ABNORMAL HIGH (ref 44–121)
BUN/Creatinine Ratio: 28 — ABNORMAL HIGH (ref 10–24)
BUN: 39 mg/dL — ABNORMAL HIGH (ref 8–27)
Bilirubin Total: 0.3 mg/dL (ref 0.0–1.2)
CO2: 27 mmol/L (ref 20–29)
Calcium: 9.8 mg/dL (ref 8.6–10.2)
Chloride: 90 mmol/L — ABNORMAL LOW (ref 96–106)
Creatinine, Ser: 1.39 mg/dL — ABNORMAL HIGH (ref 0.76–1.27)
Globulin, Total: 2.8 g/dL (ref 1.5–4.5)
Glucose: 155 mg/dL — ABNORMAL HIGH (ref 70–99)
Potassium: 4.5 mmol/L (ref 3.5–5.2)
Sodium: 134 mmol/L (ref 134–144)
Total Protein: 7.4 g/dL (ref 6.0–8.5)
eGFR: 53 mL/min/{1.73_m2} — ABNORMAL LOW (ref >59)

## 2022-01-29 NOTE — Telephone Encounter (Signed)
Please sign pending CPAP order to be faxed to [facility]

## 2022-01-30 NOTE — Telephone Encounter (Signed)
Orders signed.

## 2022-01-30 NOTE — Telephone Encounter (Signed)
Has been faxed.

## 2022-02-11 ENCOUNTER — Ambulatory Visit: Payer: Medicare Other

## 2022-02-12 ENCOUNTER — Ambulatory Visit (INDEPENDENT_AMBULATORY_CARE_PROVIDER_SITE_OTHER): Payer: Medicare Other

## 2022-02-12 DIAGNOSIS — Z23 Encounter for immunization: Secondary | ICD-10-CM

## 2022-02-12 NOTE — Progress Notes (Signed)
Shingles vaccine given to left deltoid.  Patient tolerated well.

## 2022-02-13 NOTE — Patient Instructions (Incomplete)

## 2022-02-14 ENCOUNTER — Ambulatory Visit: Payer: Medicare Other | Admitting: Nurse Practitioner

## 2022-02-14 DIAGNOSIS — E782 Mixed hyperlipidemia: Secondary | ICD-10-CM

## 2022-02-14 DIAGNOSIS — E039 Hypothyroidism, unspecified: Secondary | ICD-10-CM

## 2022-02-14 DIAGNOSIS — E1142 Type 2 diabetes mellitus with diabetic polyneuropathy: Secondary | ICD-10-CM

## 2022-02-16 ENCOUNTER — Other Ambulatory Visit: Payer: Self-pay | Admitting: Family

## 2022-02-16 ENCOUNTER — Other Ambulatory Visit: Payer: Self-pay | Admitting: Nurse Practitioner

## 2022-02-16 ENCOUNTER — Other Ambulatory Visit: Payer: Self-pay | Admitting: Cardiovascular Disease

## 2022-02-16 DIAGNOSIS — E1169 Type 2 diabetes mellitus with other specified complication: Secondary | ICD-10-CM

## 2022-02-16 DIAGNOSIS — E1142 Type 2 diabetes mellitus with diabetic polyneuropathy: Secondary | ICD-10-CM

## 2022-02-18 ENCOUNTER — Ambulatory Visit: Payer: Medicare Other | Attending: Cardiology | Admitting: Cardiology

## 2022-02-18 ENCOUNTER — Encounter: Payer: Self-pay | Admitting: Cardiology

## 2022-02-18 VITALS — BP 116/68 | HR 104 | Ht 66.0 in | Wt 149.2 lb

## 2022-02-18 DIAGNOSIS — I483 Typical atrial flutter: Secondary | ICD-10-CM | POA: Diagnosis not present

## 2022-02-18 DIAGNOSIS — I5032 Chronic diastolic (congestive) heart failure: Secondary | ICD-10-CM | POA: Diagnosis not present

## 2022-02-18 DIAGNOSIS — I10 Essential (primary) hypertension: Secondary | ICD-10-CM | POA: Insufficient documentation

## 2022-02-18 NOTE — Progress Notes (Signed)
Electrophysiology Office Follow up Visit Note:    Date:  02/18/2022   ID:  John [name], DOB May 27, 1945, MRN [id number]  PCP:  [name], [place] Cardiologist:  Evalina Field, MD  [facility] Electrophysiologist:  Vickie Epley, MD    Interval History:    John [name] is a 76 y.o. male who presents for a follow up visit. They were last seen in clinic 12/16/2021 where we planned for ILR implant 3 months after CTI ablation.   He underwent atrial flutter ablation on 01/13/2022. Successful radiofrequency ablation of atrial flutter along the cavotricuspid isthmus with complete bidirectional isthmus block achieved.   Today, he is accompanied by a family member. He states he is feeling pretty good. Overall he does not feel differently since his ablation. If he walks he notices an increase in his heart rate.  We reviewed the procedural details of ILR implant.   He denies any chest pain, shortness of breath, or peripheral edema. No lightheadedness, headaches, syncope, orthopnea, or PND.      Past Medical History:  Diagnosis Date   Adenocarcinoma of lung, right ([place]) 04/18/2016   Anemia    Anxiety    Arthritis    Asthma    Atrial flutter (HCC)    BPH (benign prostatic hyperplasia)    with urinary retention 02/06/20   CHF (congestive heart failure) (HCC)    COPD (chronic obstructive pulmonary disease) ([place])    Depression    Diabetes mellitus, type II ([place])    Dyspnea    History of kidney stones    History of radiation therapy    right lung 09/10/2020-09/17/2020   Dr Sondra Come   Hyperlipidemia    Hypertension    Hypothyroidism    Macular degeneration    Neuropathy    Non-small cell lung cancer, right ([place]) 04/18/2016   Peripheral vascular disease ([place])    Prostatitis    Pulmonary nodule, left 07/16/2016   Sleep apnea    cpap    Past Surgical History:  Procedure Laterality Date   A-FLUTTER ABLATION N/A 01/13/2022   Procedure: A-FLUTTER  ABLATION;  Surgeon: Vickie Epley, MD;  Location: [place] [facility];  Service: Cardiovascular;  Laterality: N/A;   ABDOMINAL AORTOGRAM W/LOWER EXTREMITY Left 02/06/2020   Procedure: ABDOMINAL AORTOGRAM W/LOWER EXTREMITY;  Surgeon: Lorretta Harp, MD;  Location: [place] [facility];  Service: Cardiovascular;  Laterality: Left;   ABDOMINAL AORTOGRAM W/LOWER EXTREMITY N/A 05/30/2021   Procedure: ABDOMINAL AORTOGRAM W/LOWER EXTREMITY;  Surgeon: Marty Heck, MD;  Location: [place] [facility];  Service: Cardiovascular;  Laterality: N/A;   BIOPSY  09/16/2021   Procedure: BIOPSY;  Surgeon: Gatha Mayer, MD;  Location: [place];  Service: Gastroenterology;;   CARDIOVERSION N/A 10/05/2020   Procedure: CARDIOVERSION;  Surgeon: Geralynn Rile, MD;  Location: [place];  Service: Cardiovascular;  Laterality: N/A;   CATARACT EXTRACTION, BILATERAL Bilateral    COLONOSCOPY WITH PROPOFOL N/A 06/20/2021   Procedure: COLONOSCOPY WITH PROPOFOL;  Surgeon: Irene Shipper, MD;  [facility]: [facility];  Service: Endoscopy;  Laterality: N/A;   COLONOSCOPY WITH PROPOFOL N/A 09/16/2021   Procedure: COLONOSCOPY WITH PROPOFOL;  Surgeon: Gatha Mayer, MD;  Location: [place];  Service: Gastroenterology;  Laterality: N/A;   ENDARTERECTOMY FEMORAL Right 08/20/2020   Procedure: ENDARTERECTOMY  RIGHT FEMORAL ARTERY;  Surgeon: Marty Heck, MD;  Location: [place];  Service: Vascular;  Laterality: Right;   ENTEROSCOPY N/A 10/11/2021  Procedure: ENTEROSCOPY;  Surgeon: Harvel Quale, MD;  [facility];  Service: Gastroenterology;  Laterality: N/A;   ESOPHAGOGASTRODUODENOSCOPY N/A 09/16/2021   Procedure: ESOPHAGOGASTRODUODENOSCOPY (EGD);  Surgeon: Gatha Mayer, MD;  Location: [place];  Service: Gastroenterology;  Laterality: N/A;   ESOPHAGOGASTRODUODENOSCOPY (EGD) WITH PROPOFOL N/A 09/06/2020   Procedure: ESOPHAGOGASTRODUODENOSCOPY (EGD) WITH PROPOFOL;  Surgeon: Irene Shipper, MD;  Location[facility];  Service: Endoscopy;  Laterality: N/A;   ESOPHAGOGASTRODUODENOSCOPY (EGD) WITH PROPOFOL N/A 10/11/2021   Procedure: ESOPHAGOGASTRODUODENOSCOPY (EGD) WITH PROPOFOL;  Surgeon: Harvel Quale, MD;  [facility];  Service: Gastroenterology;  Laterality: N/A;   HEMOSTASIS CLIP PLACEMENT  09/16/2021   Procedure: HEMOSTASIS CLIP PLACEMENT;  Surgeon: Gatha Mayer, MD;  Location: [place];  Service: Gastroenterology;;   HOT HEMOSTASIS N/A 09/16/2021   Procedure: HOT HEMOSTASIS (ARGON PLASMA COAGULATION/BICAP);  Surgeon: Gatha Mayer, MD;  Location: [place];  Service: Gastroenterology;  Laterality: N/A;   HOT HEMOSTASIS  10/11/2021   Procedure: HOT HEMOSTASIS (ARGON PLASMA COAGULATION/BICAP);  Surgeon: Montez Morita, Quillian Quince, MD;  [facility];  Service: Gastroenterology;;   INSERTION OF ILIAC STENT Right 08/20/2020   Procedure: RETROGRADE INSERTION OF RIGHT ILIAC STENT;  Surgeon: Marty Heck, MD;  Location: [place];  Service: Vascular;  Laterality: Right;   INTRAOPERATIVE ARTERIOGRAM Right 08/20/2020   Procedure: INTRA OPERATIVE ARTERIOGRAM ILIAC;  Surgeon: Marty Heck, MD;  Location: [place];  Service: Vascular;  Laterality: Right;   PATCH ANGIOPLASTY Right 08/20/2020   Procedure: PATCH ANGIOPLASTY RIGHT FEMORAL ARTERY;  Surgeon: Marty Heck, MD;  Location: [place];  Service: Vascular;  Laterality: Right;   PERIPHERAL VASCULAR BALLOON ANGIOPLASTY Right 05/30/2021   Procedure: PERIPHERAL VASCULAR BALLOON ANGIOPLASTY;  Surgeon: Marty Heck, MD;  Location: [place] [facility];  Service: Cardiovascular;  Laterality: Right;   PORTACATH PLACEMENT Left 06/13/2016   Procedure: INSERTION PORT-A-CATH;  Surgeon: Aviva Signs, MD;  [facility];  Service: General;  Laterality: Left;   TRANSURETHRAL RESECTION OF PROSTATE N/A 05/31/2020   Procedure: TRANSURETHRAL RESECTION OF THE PROSTATE (TURP);  Surgeon:  Cleon Gustin, MD;  [facility];  Service: Urology;  Laterality: N/A;   VIDEO BRONCHOSCOPY WITH ENDOBRONCHIAL NAVIGATION N/A 05/28/2016   Procedure: VIDEO BRONCHOSCOPY WITH ENDOBRONCHIAL NAVIGATION;  Surgeon: [name], MD;  Location: [place];  Service: Thoracic;  Laterality: N/A;   VIDEO BRONCHOSCOPY WITH ENDOBRONCHIAL ULTRASOUND N/A 05/28/2016   Procedure: VIDEO BRONCHOSCOPY WITH ENDOBRONCHIAL ULTRASOUND;  Surgeon: [name], MD;  [facility];  Service: Thoracic;  Laterality: N/A;    Current Medications: Current Meds  Medication Sig   apixaban (ELIQUIS) 5 MG TABS tablet Take 1 tablet (5 mg total) by mouth 2 (two) times daily.   Blood Glucose Monitoring Suppl (ONETOUCH VERIO REFLECT) w/Device KIT Use to test blood sugars daily as directed. DX: E11.9   [employer] IRON 325 (65 Fe) MG tablet TAKE 1 TABLET (325MG) TWICE DAILY   Cyanocobalamin (B-12 PO) Take 1 tablet by mouth daily.   dutasteride (AVODART) 0.5 MG capsule Take 1 capsule (0.5 mg total) by mouth daily.   ezetimibe (ZETIA) 10 MG tablet Take 10 mg by mouth daily.   famotidine (PEPCID) 20 MG tablet One after supper (Patient taking differently: Take 20 mg by mouth daily with supper.)   fluticasone (FLONASE) 50 MCG/ACT nasal spray PLACE 1 SPRAY INTO BOTH NOSTRILS DAILY AS NEEDED FOR ALLERGIES OR RHINITIS.   Fluticasone-Umeclidin-Vilant (TRELEGY ELLIPTA) 100-62.5-25 MCG/ACT AEPB  Inhale 1 puff into the lungs daily.   gabapentin (NEURONTIN) 300 MG capsule TAKE 1 CAPSULE BY MOUTH THREE TIMES A DAY   HYDROcodone-acetaminophen (NORCO) 5-325 MG tablet Take 0.5-1 tablets by mouth every 8 (eight) hours as needed for severe pain.   KLOR-CON M20 20 MEQ tablet TAKE 1 TABLET BY MOUTH EVERY DAY   levothyroxine (SYNTHROID) 50 MCG tablet TAKE 1 TABLET BY MOUTH EVERY DAY   linaclotide (LINZESS) 72 MCG capsule Take 1 capsule (72 mcg total) by mouth daily as needed (constipation).   metolazone (ZAROXOLYN) 2.5 MG tablet Take 2.5 mg  by mouth every Monday, Wednesday, and Friday.   NON FORMULARY CPAP at bedtime   ondansetron (ZOFRAN) 4 MG tablet Take 1 tablet (4 mg total) by mouth every 8 (eight) hours as needed for nausea or vomiting.   OneTouch Delica Lancets 84X MISC Use to test blood sugars daily as directed. DX: E11.9   ONETOUCH VERIO test strip USE TO TEST BLOOD SUGARS DAILY AS DIRECTED. DX: E11.9   OXYGEN Inhale 2 L into the lungs daily as needed (with Cpap at night).   pantoprazole (PROTONIX) 40 MG tablet Take 1 tablet (40 mg total) by mouth 2 (two) times daily before a meal.   pravastatin (PRAVACHOL) 40 MG tablet TAKE 1 TABLET (40 MG TOTAL) BY MOUTH ON MONDAYS , WEDNESDAYS, AND FRIDAYS AS DIRECTED   RYBELSUS 7 MG TABS TAKE 7 MG BY MOUTH DAILY.   spironolactone (ALDACTONE) 25 MG tablet Take 1 tablet (25 mg total) by mouth daily.   torsemide (DEMADEX) 20 MG tablet Take 2 tablets (40 mg) in the AM.   zolpidem (AMBIEN) 5 MG tablet Take 1 tablet (5 mg total) by mouth at bedtime as needed for sleep.     Allergies:   Sulfa antibiotics, Atorvastatin, Jardiance [empagliflozin], Lopressor [metoprolol], Rosuvastatin, Doxycycline, and Temazepam   Social History   Socioeconomic History   Marital status: Soil scientist    Spouse name: Not on file   Number of children: 5   Years of education: 10   Highest education level: 10th grade  Occupational History   Occupation: Retired  Tobacco Use   Smoking status: Every Day    Packs/day: 0.50    Years: 55.00    Total pack years: 27.50    Types: Cigarettes    Start date: 03/11/1961   Smokeless tobacco: Never   Tobacco comments:    1/2 pack to 1 pack per day 12/14/19  Vaping Use   Vaping Use: Never used  Substance and Sexual Activity   Alcohol use: No   Drug use: No   Sexual activity: Yes    Birth control/protection: None  Other Topics Concern   Not on file  Social History Narrative   [name] is his POA and lives with him. Children out of town - one in [facility],  others in [place].   Social Determinants of Health   Financial Resource Strain: Low Risk  (11/15/2021)   Overall Financial Resource Strain (CARDIA)    Difficulty of Paying Living Expenses: Not hard at all  Food Insecurity: No Food Insecurity (11/15/2021)   Hunger Vital Sign    Worried About Running Out of Food in the Last Year: Never true    Ran Out of Food in the Last Year: Never true  Transportation Needs: No Transportation Needs (11/15/2021)   PRAPARE - Hydrologist (Medical): No    Lack of Transportation (Non-Medical): No  Physical Activity: Inactive (  11/15/2021)   Exercise Vital Sign    Days of Exercise per Week: 0 days    Minutes of Exercise per Session: 0 min  Stress: No Stress Concern Present (11/15/2021)   [place]    Feeling of Stress : Only a little  Social Connections: Moderately Isolated (11/15/2021)   Social Connection and Isolation Panel [NHANES]    Frequency of Communication with Friends and Family: More than three times a week    Frequency of Social Gatherings with Friends and Family: Once a week    Attends Religious Services: Never    Marine scientist or Organizations: No    Attends Music therapist: Never    Marital Status: Living with partner     Family History: The patient's family history includes Diabetes in his father; Heart disease in his father; Hypertension in his mother and sister; Stroke in his father.  ROS:   Please see the history of present illness.    All other systems reviewed and are negative.  EKGs/Labs/Other Studies Reviewed:    The following studies were reviewed today:  Echo  09/15/2021:  1. Aortic valve calcified with restricted motion but no AS by doppler.   2. Left ventricular ejection fraction, by estimation, is 55 to 60%. The  left ventricle has normal function. The left ventricle has no regional  wall motion  abnormalities. Left ventricular diastolic parameters are  indeterminate.   3. Right ventricular systolic function is normal. The right ventricular  size is normal. There is mildly elevated pulmonary artery systolic  pressure.   4. Left atrial size was mildly dilated.   5. The mitral valve is normal in structure. No evidence of mitral valve  regurgitation. No evidence of mitral stenosis. Moderate mitral annular  calcification.   6. The aortic valve is calcified. Aortic valve regurgitation is trivial.  Aortic valve sclerosis/calcification is present, without any evidence of  aortic stenosis.   7. The inferior vena cava is dilated in size with <50% respiratory  variability, suggesting right atrial pressure of 15 mmHg.   EKG:  EKG is personally reviewed.  02/18/2022:  sinus, iRBBB  Recent Labs: 10/10/2021: B Natriuretic Peptide 191.0 11/25/2021: TSH 1.960 12/24/2021: Hemoglobin 14.9; Magnesium 2.2; Platelets 357 01/28/2022: ALT 14; BUN 39; Creatinine, Ser 1.39; Potassium 4.5; Sodium 134   Recent Lipid Panel    Component Value Date/Time   CHOL 126 08/21/2020 0530   CHOL 136 04/25/2019 1608   TRIG 89 08/21/2020 0530   HDL 28 (L) 08/21/2020 0530   HDL 28 (L) 04/25/2019 1608   CHOLHDL 4.5 08/21/2020 0530   VLDL 18 08/21/2020 0530   LDLCALC 80 08/21/2020 0530   LDLCALC 78 04/25/2019 1608    Physical Exam:    VS:  BP 116/68   Pulse (!) 104   Ht 5' 6" (1.676 m)   Wt 149 lb 3.2 oz (67.7 kg)   SpO2 96%   BMI 24.08 kg/m     Wt Readings from Last 3 Encounters:  02/18/22 149 lb 3.2 oz (67.7 kg)  01/28/22 149 lb (67.6 kg)  01/13/22 147 lb (66.7 kg)     GEN: Well nourished, well developed in no acute distress HEENT: Normal NECK: No JVD; No carotid bruits LYMPHATICS: No lymphadenopathy CARDIAC: RRR, no murmurs, rubs, gallops RESPIRATORY:  Clear to auscultation without rales, wheezing or rhonchi  ABDOMEN: Soft, non-tender, non-distended MUSCULOSKELETAL:  No edema; No deformity   SKIN: Warm and dry  NEUROLOGIC:  Alert and oriented x 3 PSYCHIATRIC:  Normal affect        ASSESSMENT:    1. Typical atrial flutter ([place])   2. Chronic diastolic heart failure ([place])   3. Primary hypertension    PLAN:    In order of problems listed above:  #Typical atrial flutter No evidence of AF thus far.   Discussed treatment options today for his AFL including antiarrhythmic drug therapy and ablation. Discussed risks, recovery and likelihood of success. Discussed potential need for repeat ablation procedures and antiarrhythmic drugs after an initial ablation. They wish to proceed with scheduling. Discussed the possibility of developing AF after a AFL ablation.   Risk, benefits, and alternatives to EP study and radiofrequency ablation for AFL were also discussed in detail today. These risks include but are not limited to stroke, bleeding, vascular damage, tamponade, perforation, damage to the esophagus, lungs, and other structures, worsening renal function, and death. The patient understands these risk and wishes to proceed.  We will therefore proceed with catheter ablation at the next available time.  Carto, ICE, anesthesia are requested for the procedure.     Plan for ILR implant 3 months after CTI ablation. If no recurrence on loop recorder monitoring, can plan for anticoagulation discontinuation.  If he were to develop recurrence of atrial flutter or develop atrial fibrillation post CTI ablation, will discuss left atrial appendage occlusion.  Plan for [facility] ILR implant 3 months s/p ablation.   #Hypertension At goal today.  Recommend checking blood pressures 1-2 times per week at home and recording the values.  Recommend bringing these recordings to the primary care physician.   Medication Adjustments/Labs and Tests Ordered: Current medicines are reviewed at length with the patient today.  Concerns regarding medicines are outlined above.   No orders of the defined types  were placed in this encounter.  No orders of the defined types were placed in this encounter.  I,Mathew Stumpf,acting as a Education administrator for Vickie Epley, MD.,have documented all relevant documentation on the behalf of Vickie Epley, MD,as directed by  Vickie Epley, MD while in the presence of Vickie Epley, MD.  I, Vickie Epley, MD, have reviewed all documentation for this visit. The documentation on 02/18/22 for the exam, diagnosis, procedures, and orders are all accurate and complete.   Signed, Lars Mage, MD, [facility], [facility] 02/18/2022 3:16 PM    Electrophysiology [facility]

## 2022-02-18 NOTE — Patient Instructions (Signed)
Medication Instructions:  No changes *If you need a refill on your cardiac medications before your next appointment, please call your pharmacy*   Lab Work: None  If you have labs (blood work) drawn today and your tests are completely normal, you will receive your results only by: [place] (if you have MyChart) OR A paper copy in the mail If you have any lab test that is abnormal or we need to change your treatment, we will call you to review the results.   Testing/Procedures: Implantable loop recorder   Follow-Up: At [facility], you and your health needs are our priority.  As part of our continuing mission to provide you with exceptional heart care, we have created designated Provider Care Teams.  These Care Teams include your primary Cardiologist (physician) and Advanced Practice Providers (APPs -  Physician Assistants and Nurse Practitioners) who all work together to provide you with the care you need, when you need it.  We recommend signing up for the patient portal called "MyChart".  Sign up information is provided on this After Visit Summary.  MyChart is used to connect with patients for Virtual Visits (Telemedicine).  Patients are able to view lab/test results, encounter notes, upcoming appointments, etc.  Non-urgent messages can be sent to your provider as well.   To learn more about what you can do with MyChart, go to [email or web address].    Your next appointment:   ILR implant in January. BSX device.   The format for your next appointment:   In Person  Provider:   Lars Mage, MD    Other Instructions None   Important Information About Sugar

## 2022-02-21 ENCOUNTER — Ambulatory Visit (INDEPENDENT_AMBULATORY_CARE_PROVIDER_SITE_OTHER): Payer: Medicare Other

## 2022-02-21 ENCOUNTER — Encounter: Payer: Self-pay | Admitting: Family

## 2022-02-21 ENCOUNTER — Ambulatory Visit (INDEPENDENT_AMBULATORY_CARE_PROVIDER_SITE_OTHER): Payer: Medicare Other | Admitting: Family

## 2022-02-21 VITALS — BP 145/83 | HR 110 | Temp 97.8°F | Ht 66.0 in | Wt 148.0 lb

## 2022-02-21 DIAGNOSIS — R5383 Other fatigue: Secondary | ICD-10-CM

## 2022-02-21 DIAGNOSIS — Z8719 Personal history of other diseases of the digestive system: Secondary | ICD-10-CM | POA: Diagnosis not present

## 2022-02-21 DIAGNOSIS — M25542 Pain in joints of left hand: Secondary | ICD-10-CM

## 2022-02-21 DIAGNOSIS — E1142 Type 2 diabetes mellitus with diabetic polyneuropathy: Secondary | ICD-10-CM | POA: Diagnosis not present

## 2022-02-21 DIAGNOSIS — M25442 Effusion, left hand: Secondary | ICD-10-CM

## 2022-02-21 DIAGNOSIS — M7989 Other specified soft tissue disorders: Secondary | ICD-10-CM | POA: Diagnosis not present

## 2022-02-21 MED ORDER — METHYLPREDNISOLONE ACETATE 80 MG/ML IJ SUSP
80.0000 mg | Freq: Once | INTRAMUSCULAR | Status: AC
  Administered 2022-02-21: 80 mg via INTRAMUSCULAR

## 2022-02-21 MED ORDER — KETOROLAC TROMETHAMINE 60 MG/2ML IM SOLN
30.0000 mg | Freq: Once | INTRAMUSCULAR | Status: AC
  Administered 2022-02-21: 30 mg via INTRAMUSCULAR

## 2022-02-21 MED ORDER — TRAMADOL HCL 50 MG PO TABS: 50.0000 mg | ORAL_TABLET | Freq: Four times a day (QID) | ORAL | 0 refills | Status: DC | PRN

## 2022-02-21 NOTE — Addendum Note (Signed)
Addended by: Evelina Dun A on: 02/21/2022 04:11 PM   Modules accepted: Level of Service

## 2022-02-21 NOTE — Progress Notes (Signed)
Subjective:    Patient ID: John [name], male    DOB: 1946/01/07, 76 y.o.   MRN: [id number]  Chief Complaint  Patient presents with   hand swollen    Fatigue    Wants hgb check    Pt presents to the office today with left index proximal  joint swelling that started three days ago that is worsening. Reports the pain has worsening and is 10 out 10. States the pain is making him nausea. Denies any hx of gout.   Also complaining of fatigue, wants hgb checked. Has hx of GI bleed.   He has DM, but has been controlled. Glucose today was 315.  Hand Pain  The incident occurred 3 to 5 days ago. There was no injury mechanism. The pain is present in the left hand. The quality of the pain is described as aching. The pain is at a severity of 10/10. The pain is severe. The pain has been Constant since the incident. Pertinent negatives include no numbness or tingling.      Review of Systems  Neurological:  Negative for tingling and numbness.  All other systems reviewed and are negative.      Objective:   Physical Exam Vitals reviewed.  Constitutional:      General: He is not in acute distress.    Appearance: He is well-developed.  HENT:     Head: Normocephalic.  Eyes:     General:        Right eye: No discharge.        Left eye: No discharge.     Pupils: Pupils are equal, round, and reactive to light.  Neck:     Thyroid: No thyromegaly.  Cardiovascular:     Rate and Rhythm: Normal rate and regular rhythm.     Heart sounds: Normal heart sounds. No murmur heard. Pulmonary:     Effort: Pulmonary effort is normal. No respiratory distress.     Breath sounds: Normal breath sounds. No wheezing.  Abdominal:     General: Bowel sounds are normal. There is no distension.     Palpations: Abdomen is soft.     Tenderness: There is no abdominal tenderness.  Musculoskeletal:        General: Swelling and tenderness present. Normal range of motion.     Cervical back: Normal range of motion  and neck supple.     Comments: Left index proximal swelling, erythemas, and tenderness and left thumb  Skin:    General: Skin is warm and dry.     Findings: No erythema or rash.  Neurological:     Mental Status: He is alert and oriented to person, place, and time.     Cranial Nerves: No cranial nerve deficit.     Gait: Gait abnormal.     Deep Tendon Reflexes: Reflexes are normal and symmetric.  Psychiatric:        Behavior: Behavior normal.        Thought Content: Thought content normal.        Judgment: Judgment normal.      BP (!) 145/83   Pulse (!) 110   Temp 97.8 F (36.6 C) (Temporal)   Ht _0  (1.676 m)   Wt 148 lb (67.1 kg)   SpO2 95%   BMI 23.89 kg/m       Assessment & Plan:  [name] comes in today with chief complaint of hand swollen  and Fatigue (Wants hgb check )   Diagnosis  and orders addressed:  1. Arthralgia of left hand - CMP14+EGFR - Uric acid - DG Hand Complete Left - methylPREDNISolone acetate (DEPO-MEDROL) injection 80 mg - ketorolac (TORADOL) injection 30 mg - traMADol (ULTRAM) 50 MG tablet; Take 1 tablet (50 mg total) by mouth every 6 (six) hours as needed.  Dispense: 20 tablet; Refill: 0  2. Swelling of joint of left hand - CMP14+EGFR - Uric acid - DG Hand Complete Left - methylPREDNISolone acetate (DEPO-MEDROL) injection 80 mg - ketorolac (TORADOL) injection 30 mg - traMADol (ULTRAM) 50 MG tablet; Take 1 tablet (50 mg total) by mouth every 6 (six) hours as needed.  Dispense: 20 tablet; Refill: 0  3. Other fatigue - Anemia Profile B - CMP14+EGFR  4. Type 2 diabetes mellitus with diabetic polyneuropathy, without long-term current use of insulin (HCC) - CMP14+EGFR  5. H/O: GI bleed - Anemia Profile B - CMP14+EGFR   Labs pending Strict low carb diet, steroid injection given.  Ultram given for weekend pain, only take as needed  Health Maintenance reviewed Diet and exercise encouraged  Follow up plan: Keep chronic  follow up   Evelina Dun, FNP

## 2022-02-21 NOTE — Patient Instructions (Signed)
Gout  Gout is a condition that causes painful swelling of the joints. Gout is a type of inflammation of the joints (arthritis). This condition is caused by having too much uric acid in the body. Uric acid is a chemical that forms when the body breaks down substances called purines. Purines are important for building body proteins. When the body has too much uric acid, sharp crystals can form and build up inside the joints. This causes pain and swelling. Gout attacks can happen quickly and may be very painful (acute gout). Over time, the attacks can affect more joints and become more frequent (chronic gout). Gout can also cause uric acid to build up under the skin and inside the kidneys. What are the causes? This condition is caused by too much uric acid in your blood. This can happen because: Your kidneys do not remove enough uric acid from your blood. This is the most common cause. Your body makes too much uric acid. This can happen with some cancers and cancer treatments. It can also occur if your body is breaking down too many red blood cells (hemolytic anemia). You eat too many foods that are high in purines. These foods include organ meats and some seafood. Alcohol, especially beer, is also high in purines. A gout attack may be triggered by trauma or stress. What increases the risk? The following factors may make you more likely to develop this condition: Having a family history of gout. Being male and middle-aged. Being male and having gone through menopause. Taking certain medicines, including aspirin, cyclosporine, diuretics, levodopa, and niacin. Having an organ transplant. Having certain conditions, such as: Being obese. Lead poisoning. Kidney disease. A skin condition called psoriasis. Other factors include: Losing weight too quickly. Being dehydrated. Frequently drinking alcohol, especially beer. Frequently drinking beverages that are sweetened with a type of sugar called  fructose. What are the signs or symptoms? An attack of acute gout happens quickly. It usually occurs in just one joint. The most common place is the big toe. Attacks often start at night. Other joints that may be affected include joints of the feet, ankle, knee, fingers, wrist, or elbow. Symptoms of this condition may include: Severe pain. Warmth. Swelling. Stiffness. Tenderness. The affected joint may be very painful to touch. Shiny, red, or purple skin. Chills and fever. Chronic gout may cause symptoms more frequently. More joints may be involved. You may also have white or yellow lumps (tophi) on your hands or feet or in other areas near your joints. How is this diagnosed? This condition is diagnosed based on your symptoms, your medical history, and a physical exam. You may have tests, such as: Blood tests to measure uric acid levels. Removal of joint fluid with a thin needle (aspiration) to look for uric acid crystals. X-rays to look for joint damage. How is this treated? Treatment for this condition has two phases: treating an acute attack and preventing future attacks. Acute gout treatment may include medicines to reduce pain and swelling, including: NSAIDs, such as ibuprofen. Steroids. These are strong anti-inflammatory medicines that can be taken by mouth (orally) or injected into a joint. Colchicine. This medicine relieves pain and swelling when it is taken soon after an attack. It can be given by mouth or through an IV. Preventive treatment may include: Daily use of smaller doses of NSAIDs or colchicine. Use of a medicine that reduces uric acid levels in your blood, such as allopurinol. Changes to your diet. You may need to see   a dietitian about what to eat and drink to prevent gout. Follow these instructions at home: During a gout attack  If directed, put ice on the affected area. To do this: Put ice in a plastic bag. Place a towel between your skin and the bag. Leave the  ice on for 20 minutes, 2-3 times a day. Remove the ice if your skin turns bright red. This is very important. If you cannot feel pain, heat, or cold, you have a greater risk of damage to the area. Raise (elevate) the affected joint above the level of your heart as often as possible. Rest the joint as much as possible. If the affected joint is in your leg, you may be given crutches to use. Follow instructions from your health care provider about eating or drinking restrictions. Avoiding future gout attacks Follow a low-purine diet as told by your dietitian or health care provider. Avoid foods and drinks that are high in purines, including liver, kidney, anchovies, asparagus, herring, mushrooms, mussels, and beer. Maintain a healthy weight or lose weight if you are overweight. If you want to lose weight, talk with your health care provider. Do not lose weight too quickly. Start or maintain an exercise program as told by your health care provider. Eating and drinking Avoid drinking beverages that contain fructose. Drink enough fluids to keep your urine pale yellow. If you drink alcohol: Limit how much you have to: 0-1 drink a day for women who are not pregnant. 0-2 drinks a day for men. Know how much alcohol is in a drink. In the [place] one drink equals one 12 oz bottle of beer (355 mL), one 5 oz glass of wine (148 mL), or one 1 oz glass of hard liquor (44 mL). General instructions Take over-the-counter and prescription medicines only as told by your health care provider. Ask your health care provider if the medicine prescribed to you requires you to avoid driving or using machinery. Return to your normal activities as told by your health care provider. Ask your health care provider what activities are safe for you. Keep all follow-up visits. This is important. Where to find more information [employer] of [employer]: www.niams.nih.gov Contact a health care provider if you have: Another  gout attack. Continuing symptoms of a gout attack after 10 days of treatment. Side effects from your medicines. Chills or a fever. Burning pain when you urinate. Pain in your lower back or abdomen. Get help right away if you: Have severe or uncontrolled pain. Cannot urinate. Summary Gout is painful swelling of the joints caused by having too much uric acid in the body. The most common site for gout to occur is in the big toe, but it can affect other joints in the body. Medicines and dietary changes can help to prevent and treat gout attacks. This information is not intended to replace advice given to you by your health care provider. Make sure you discuss any questions you have with your health care provider. Document Revised: 12/26/2020 Document Reviewed: 12/26/2020 [employer] Patient Education  2023 [employer]  

## 2022-02-22 LAB — ANEMIA PROFILE B
Basophils Absolute: 0.1 10*3/uL (ref 0.0–0.2)
Basos: 1 %
EOS (ABSOLUTE): 0.2 10*3/uL (ref 0.0–0.4)
Eos: 2 %
Ferritin: 100 ng/mL (ref 30–400)
Folate: 4.9 ng/mL (ref >3.0)
Hematocrit: 44.1 % (ref 37.5–51.0)
Hemoglobin: 15.2 g/dL (ref 13.0–17.7)
Immature Grans (Abs): 0.1 10*3/uL (ref 0.0–0.1)
Immature Granulocytes: 1 %
Iron Saturation: 13 % — ABNORMAL LOW (ref 15–55)
Iron: 53 ug/dL (ref 38–169)
Lymphocytes Absolute: 0.8 10*3/uL (ref 0.7–3.1)
Lymphs: 6 %
MCH: 32.7 pg (ref 26.6–33.0)
MCHC: 34.5 g/dL (ref 31.5–35.7)
MCV: 95 fL (ref 79–97)
Monocytes Absolute: 1.6 10*3/uL — ABNORMAL HIGH (ref 0.1–0.9)
Monocytes: 12 %
Neutrophils Absolute: 10.4 10*3/uL — ABNORMAL HIGH (ref 1.4–7.0)
Neutrophils: 78 %
Platelets: 420 10*3/uL (ref 150–450)
RBC: 4.65 x10E6/uL (ref 4.14–5.80)
RDW: 13.9 % (ref 11.6–15.4)
Retic Ct Pct: 1.9 % (ref 0.6–2.6)
Total Iron Binding Capacity: 404 ug/dL (ref 250–450)
UIBC: 351 ug/dL — ABNORMAL HIGH (ref 111–343)
Vitamin B-12: >2000 pg/mL — ABNORMAL HIGH (ref 232–1245)
WBC: 13.1 10*3/uL — ABNORMAL HIGH (ref 3.4–10.8)

## 2022-02-22 LAB — CMP14+EGFR
ALT: 12 IU/L (ref 0–44)
AST: 16 IU/L (ref 0–40)
Albumin/Globulin Ratio: 1.2 (ref 1.2–2.2)
Albumin: 4.1 g/dL (ref 3.8–4.8)
Alkaline Phosphatase: 163 IU/L — ABNORMAL HIGH (ref 44–121)
BUN/Creatinine Ratio: 17 (ref 10–24)
BUN: 25 mg/dL (ref 8–27)
Bilirubin Total: 0.2 mg/dL (ref 0.0–1.2)
CO2: 25 mmol/L (ref 20–29)
Calcium: 9.9 mg/dL (ref 8.6–10.2)
Chloride: 89 mmol/L — ABNORMAL LOW (ref 96–106)
Creatinine, Ser: 1.45 mg/dL — ABNORMAL HIGH (ref 0.76–1.27)
Globulin, Total: 3.5 g/dL (ref 1.5–4.5)
Glucose: 156 mg/dL — ABNORMAL HIGH (ref 70–99)
Potassium: 4.4 mmol/L (ref 3.5–5.2)
Sodium: 136 mmol/L (ref 134–144)
Total Protein: 7.6 g/dL (ref 6.0–8.5)
eGFR: 50 mL/min/{1.73_m2} — ABNORMAL LOW (ref >59)

## 2022-02-22 LAB — URIC ACID: Uric Acid: 12.6 mg/dL — ABNORMAL HIGH (ref 3.8–8.4)

## 2022-02-24 ENCOUNTER — Encounter: Payer: Self-pay | Admitting: Nurse Practitioner

## 2022-02-24 ENCOUNTER — Ambulatory Visit (INDEPENDENT_AMBULATORY_CARE_PROVIDER_SITE_OTHER): Payer: Medicare Other | Admitting: Nurse Practitioner

## 2022-02-24 VITALS — BP 118/72 | HR 109 | Ht 66.0 in | Wt 146.2 lb

## 2022-02-24 DIAGNOSIS — E782 Mixed hyperlipidemia: Secondary | ICD-10-CM | POA: Diagnosis not present

## 2022-02-24 DIAGNOSIS — E1142 Type 2 diabetes mellitus with diabetic polyneuropathy: Secondary | ICD-10-CM

## 2022-02-24 DIAGNOSIS — E039 Hypothyroidism, unspecified: Secondary | ICD-10-CM

## 2022-02-24 MED ORDER — RYBELSUS 14 MG PO TABS: 14.0000 mg | ORAL_TABLET | Freq: Every day | ORAL | 3 refills | Status: DC

## 2022-02-24 MED ORDER — ONETOUCH DELICA LANCETS 30G MISC: 1 refills | Status: DC

## 2022-02-24 NOTE — Progress Notes (Signed)
Endocrinology Follow Up Note       02/24/2022, 1:22 PM   Subjective:    Patient ID: John [name], male    DOB: [birth year].  John [name] is being seen in follow up after being seen in consultation for management of currently uncontrolled symptomatic diabetes requested by  [name], FNP.   Past Medical History:  Diagnosis Date   Adenocarcinoma of lung, right ([place]) 04/18/2016   Anemia    Anxiety    Arthritis    Asthma    Atrial flutter (HCC)    BPH (benign prostatic hyperplasia)    with urinary retention 02/06/20   CHF (congestive heart failure) (HCC)    COPD (chronic obstructive pulmonary disease) ([place])    Depression    Diabetes mellitus, type II ([place])    Dyspnea    History of kidney stones    History of radiation therapy    right lung 09/10/2020-09/17/2020   Dr Sondra Come   Hyperlipidemia    Hypertension    Hypothyroidism    Macular degeneration    Neuropathy    Non-small cell lung cancer, right ([place]) 04/18/2016   Peripheral vascular disease ([place])    Prostatitis    Pulmonary nodule, left 07/16/2016   Sleep apnea    cpap    Past Surgical History:  Procedure Laterality Date   A-FLUTTER ABLATION N/A 01/13/2022   Procedure: A-FLUTTER ABLATION;  Surgeon: Vickie Epley, MD;  Location: [facility];  Service: Cardiovascular;  Laterality: N/A;   ABDOMINAL AORTOGRAM W/LOWER EXTREMITY Left 02/06/2020   Procedure: ABDOMINAL AORTOGRAM W/LOWER EXTREMITY;  Surgeon: Lorretta Harp, MD;  Location: [facility];  Service: Cardiovascular;  Laterality: Left;   ABDOMINAL AORTOGRAM W/LOWER EXTREMITY N/A 05/30/2021   Procedure: ABDOMINAL AORTOGRAM W/LOWER EXTREMITY;  Surgeon: Marty Heck, MD;  Location: [facility];  Service: Cardiovascular;  Laterality: N/A;   BIOPSY  09/16/2021   Procedure: BIOPSY;  Surgeon: Gatha Mayer, MD;  Location: [facility];  Service:  Gastroenterology;;   CARDIOVERSION N/A 10/05/2020   Procedure: CARDIOVERSION;  Surgeon: Geralynn Rile, MD;  Location: [facility];  Service: Cardiovascular;  Laterality: N/A;   CATARACT EXTRACTION, BILATERAL Bilateral    COLONOSCOPY WITH PROPOFOL N/A 06/20/2021   Procedure: COLONOSCOPY WITH PROPOFOL;  Surgeon: Irene Shipper, MD;  [facility]: [facility];  Service: Endoscopy;  Laterality: N/A;   COLONOSCOPY WITH PROPOFOL N/A 09/16/2021   Procedure: COLONOSCOPY WITH PROPOFOL;  Surgeon: Gatha Mayer, MD;  Location: [place];  Service: Gastroenterology;  Laterality: N/A;   ENDARTERECTOMY FEMORAL Right 08/20/2020   Procedure: ENDARTERECTOMY  RIGHT FEMORAL ARTERY;  Surgeon: Marty Heck, MD;  Location: [facility];  Service: Vascular;  Laterality: Right;   ENTEROSCOPY N/A 10/11/2021   Procedure: ENTEROSCOPY;  Surgeon: Harvel Quale, MD;  [facility];  Service: Gastroenterology;  Laterality: N/A;   ESOPHAGOGASTRODUODENOSCOPY N/A 09/16/2021   Procedure: ESOPHAGOGASTRODUODENOSCOPY (EGD);  Surgeon: Gatha Mayer, MD;  Location: [place] [facility];  Service: Gastroenterology;  Laterality: N/A;   ESOPHAGOGASTRODUODENOSCOPY (EGD) WITH PROPOFOL N/A 09/06/2020   Procedure: ESOPHAGOGASTRODUODENOSCOPY (EGD) WITH PROPOFOL;  Surgeon: Irene Shipper, MD;  Location: [place] [facility];  Service: Endoscopy;  Laterality: N/A;   ESOPHAGOGASTRODUODENOSCOPY (EGD) WITH PROPOFOL N/A 10/11/2021   Procedure: ESOPHAGOGASTRODUODENOSCOPY (EGD) WITH PROPOFOL;  Surgeon: Harvel Quale, MD;  [facility];  Service: Gastroenterology;  Laterality: N/A;   HEMOSTASIS CLIP PLACEMENT  09/16/2021   Procedure: HEMOSTASIS CLIP PLACEMENT;  Surgeon: Gatha Mayer, MD;  Location: [facility];  Service: Gastroenterology;;   HOT HEMOSTASIS N/A 09/16/2021   Procedure: HOT HEMOSTASIS (ARGON PLASMA COAGULATION/BICAP);  Surgeon: Gatha Mayer, MD;  Location: [facility];  Service: Gastroenterology;   Laterality: N/A;   HOT HEMOSTASIS  10/11/2021   Procedure: HOT HEMOSTASIS (ARGON PLASMA COAGULATION/BICAP);  Surgeon: Montez Morita, Quillian Quince, MD;  [facility];  Service: Gastroenterology;;   INSERTION OF ILIAC STENT Right 08/20/2020   Procedure: RETROGRADE INSERTION OF RIGHT ILIAC STENT;  Surgeon: Marty Heck, MD;  Location: [place];  Service: Vascular;  Laterality: Right;   INTRAOPERATIVE ARTERIOGRAM Right 08/20/2020   Procedure: INTRA OPERATIVE ARTERIOGRAM ILIAC;  Surgeon: Marty Heck, MD;  Location: [facility];  Service: Vascular;  Laterality: Right;   PATCH ANGIOPLASTY Right 08/20/2020   Procedure: PATCH ANGIOPLASTY RIGHT FEMORAL ARTERY;  Surgeon: Marty Heck, MD;  Location: [facility] [place];  Service: Vascular;  Laterality: Right;   PERIPHERAL VASCULAR BALLOON ANGIOPLASTY Right 05/30/2021   Procedure: PERIPHERAL VASCULAR BALLOON ANGIOPLASTY;  Surgeon: Marty Heck, MD;  Location: [facility];  Service: Cardiovascular;  Laterality: Right;   PORTACATH PLACEMENT Left 06/13/2016   Procedure: INSERTION PORT-A-CATH;  Surgeon: Aviva Signs, MD;  Location[facility];  Service: General;  Laterality: Left;   TRANSURETHRAL RESECTION OF PROSTATE N/A 05/31/2020   Procedure: TRANSURETHRAL RESECTION OF THE PROSTATE (TURP);  Surgeon: Cleon Gustin, MD;  [facility];  Service: Urology;  Laterality: N/A;   VIDEO BRONCHOSCOPY WITH ENDOBRONCHIAL NAVIGATION N/A 05/28/2016   Procedure: VIDEO BRONCHOSCOPY WITH ENDOBRONCHIAL NAVIGATION;  Surgeon: [name], MD;  Location: [facility];  Service: Thoracic;  Laterality: N/A;   VIDEO BRONCHOSCOPY WITH ENDOBRONCHIAL ULTRASOUND N/A 05/28/2016   Procedure: VIDEO BRONCHOSCOPY WITH ENDOBRONCHIAL ULTRASOUND;  Surgeon: [name], MD;  [facility];  Service: Thoracic;  Laterality: N/A;    Social History   Socioeconomic History   Marital status: Soil scientist    Spouse name: Not on file   Number of children: 5    Years of education: 10   Highest education level: 10th grade  Occupational History   Occupation: Retired  Tobacco Use   Smoking status: Every Day    Packs/day: 0.50    Years: 55.00    Total pack years: 27.50    Types: Cigarettes    Start date: 03/11/1961   Smokeless tobacco: Never   Tobacco comments:    1/2 pack to 1 pack per day 12/14/19  Vaping Use   Vaping Use: Never used  Substance and Sexual Activity   Alcohol use: No   Drug use: No   Sexual activity: Yes    Birth control/protection: None  Other Topics Concern   Not on file  Social History Narrative   [name] is his POA and lives with him. Children out of town - one in [facility], others in [place].   Social Determinants of Health   Financial Resource Strain: Low Risk  (11/15/2021)   Overall Financial Resource Strain (CARDIA)    Difficulty of Paying Living Expenses: Not hard at all  Food Insecurity: No Food Insecurity (11/15/2021)   Hunger Vital Sign    Worried  About Running Out of Food in the Last Year: Never true    Ran Out of Food in the Last Year: Never true  Transportation Needs: No Transportation Needs (11/15/2021)   PRAPARE - Hydrologist (Medical): No    Lack of Transportation (Non-Medical): No  Physical Activity: Inactive (11/15/2021)   Exercise Vital Sign    Days of Exercise per Week: 0 days    Minutes of Exercise per Session: 0 min  Stress: No Stress Concern Present (11/15/2021)   [place]    Feeling of Stress : Only a little  Social Connections: Moderately Isolated (11/15/2021)   Social Connection and Isolation Panel [NHANES]    Frequency of Communication with Friends and Family: More than three times a week    Frequency of Social Gatherings with Friends and Family: Once a week    Attends Religious Services: Never    Marine scientist or Organizations: No    Attends Music therapist: Never     Marital Status: Living with partner    Family History  Problem Relation Age of Onset   Hypertension Mother    Diabetes Father    Heart disease Father    Stroke Father    Hypertension Sister     Outpatient Encounter Medications as of 02/24/2022  Medication Sig   apixaban (ELIQUIS) 5 MG TABS tablet Take 1 tablet (5 mg total) by mouth 2 (two) times daily.   Blood Glucose Monitoring Suppl (ONETOUCH VERIO REFLECT) w/Device KIT Use to test blood sugars daily as directed. DX: E11.9   [employer] IRON 325 (65 Fe) MG tablet TAKE 1 TABLET (325MG) TWICE DAILY   Cyanocobalamin (B-12 PO) Take 1 tablet by mouth daily.   dutasteride (AVODART) 0.5 MG capsule Take 1 capsule (0.5 mg total) by mouth daily.   ezetimibe (ZETIA) 10 MG tablet Take 10 mg by mouth daily.   famotidine (PEPCID) 20 MG tablet One after supper (Patient taking differently: Take 20 mg by mouth daily with supper.)   fluticasone (FLONASE) 50 MCG/ACT nasal spray PLACE 1 SPRAY INTO BOTH NOSTRILS DAILY AS NEEDED FOR ALLERGIES OR RHINITIS.   Fluticasone-Umeclidin-Vilant (TRELEGY ELLIPTA) 100-62.5-25 MCG/ACT AEPB Inhale 1 puff into the lungs daily.   gabapentin (NEURONTIN) 300 MG capsule TAKE 1 CAPSULE BY MOUTH THREE TIMES A DAY   HYDROcodone-acetaminophen (NORCO) 5-325 MG tablet Take 0.5-1 tablets by mouth every 8 (eight) hours as needed for severe pain.   KLOR-CON M20 20 MEQ tablet TAKE 1 TABLET BY MOUTH EVERY DAY   levothyroxine (SYNTHROID) 50 MCG tablet TAKE 1 TABLET BY MOUTH EVERY DAY   linaclotide (LINZESS) 72 MCG capsule Take 1 capsule (72 mcg total) by mouth daily as needed (constipation).   metolazone (ZAROXOLYN) 2.5 MG tablet Take 2.5 mg by mouth every Monday, Wednesday, and Friday.   NON FORMULARY CPAP at bedtime   ondansetron (ZOFRAN) 4 MG tablet Take 1 tablet (4 mg total) by mouth every 8 (eight) hours as needed for nausea or vomiting.   ONETOUCH VERIO test strip USE TO TEST BLOOD SUGARS DAILY AS DIRECTED. DX: E11.9   OXYGEN Inhale 2  L into the lungs daily as needed (with Cpap at night).   pantoprazole (PROTONIX) 40 MG tablet Take 1 tablet (40 mg total) by mouth 2 (two) times daily before a meal.   pravastatin (PRAVACHOL) 40 MG tablet TAKE 1 TABLET (40 MG TOTAL) BY MOUTH ON MONDAYS , WEDNESDAYS, AND FRIDAYS  AS DIRECTED   Semaglutide (RYBELSUS) 14 MG TABS Take 1 tablet (14 mg total) by mouth daily.   spironolactone (ALDACTONE) 25 MG tablet Take 1 tablet (25 mg total) by mouth daily.   torsemide (DEMADEX) 20 MG tablet Take 2 tablets (40 mg) in the AM.   traMADol (ULTRAM) 50 MG tablet Take 1 tablet (50 mg total) by mouth every 6 (six) hours as needed.   zolpidem (AMBIEN) 5 MG tablet Take 1 tablet (5 mg total) by mouth at bedtime as needed for sleep.   [DISCONTINUED] OneTouch Delica Lancets 41D MISC Use to test blood sugars daily as directed. DX: E11.9   [DISCONTINUED] RYBELSUS 7 MG TABS TAKE 7 MG BY MOUTH DAILY.   OneTouch Delica Lancets 40C MISC Use to test blood sugars daily as directed. DX: E11.9   No facility-administered encounter medications on file as of 02/24/2022.    ALLERGIES: Allergies  Allergen Reactions   Sulfa Antibiotics Swelling    Mouth swelling   Atorvastatin Other (See Comments)    Muscle aches - tolerating Pravastatin 40 mg MWF   Jardiance [Empagliflozin] Other (See Comments)    FEELS SLUGGISH, TIRED   Lopressor [Metoprolol] Other (See Comments)    Fatigue   Rosuvastatin Other (See Comments)    Muscle aches - tolerating Pravastatin 40 mg MWF   Doxycycline Nausea Only   Temazepam Other (See Comments)    Made insomnia worse     VACCINATION STATUS: Immunization History  Administered Date(s) Administered   Fluad Quad(high Dose 65+) 01/19/2019, 02/01/2020, 01/23/2021, 01/28/2022   Influenza, High Dose Seasonal PF 12/28/2015   Influenza,inj,Quad PF,6+ Mos 01/23/2017   Moderna Sars-Covid-2 Vaccination 05/10/2019, 06/07/2019, 01/31/2020   Pneumococcal Conjugate-13 06/17/2017   Pneumococcal  Polysaccharide-23 04/25/2019   Tdap 05/17/2019   Zoster Recombinat (Shingrix) 11/25/2021, 02/12/2022   Zoster, Live 07/10/2014    Diabetes He presents for his follow-up diabetic visit. He has type 2 diabetes mellitus. Onset time: Was diagnosed at approx age of 75. His disease course has been worsening. There are no hypoglycemic associated symptoms. Pertinent negatives for hypoglycemia include no nervousness/anxiousness or tremors. Associated symptoms include blurred vision and foot paresthesias. Pertinent negatives for diabetes include no fatigue, no polydipsia, no polyphagia and no weight loss. There are no hypoglycemic complications. Symptoms are stable. Diabetic complications include heart disease, nephropathy, peripheral neuropathy and PVD. Risk factors for coronary artery disease include diabetes mellitus, dyslipidemia, hypertension, male sex, sedentary lifestyle and tobacco exposure. Current diabetic treatment includes oral agent (monotherapy). He is compliant with treatment most of the time. His weight is fluctuating minimally. He is following a generally healthy diet. When asked about meal planning, he reported none. He has not had a previous visit with a dietitian. He rarely participates in exercise. His home blood glucose trend is increasing steadily. His breakfast blood glucose range is generally 140-180 mg/dl. (He presents today with his meter and logs showing above target glycemic profile overall.  His previsit A1c on 10/24 was 7.8% increasing from last visit.  He notes he had a steroid injection recently for a gout flare.  He denies any hypoglycemia.) An ACE inhibitor/angiotensin II receptor blocker is being taken. He sees a podiatrist.Eye exam is current.  Hyperlipidemia This is a chronic problem. The current episode started more than 1 year ago. The problem is controlled. Recent lipid tests were reviewed and are normal. Exacerbating diseases include chronic renal disease, diabetes and  hypothyroidism. Factors aggravating his hyperlipidemia include fatty foods and beta blockers. Current antihyperlipidemic treatment includes  statins. The current treatment provides moderate improvement of lipids. There are no compliance problems.  Risk factors for coronary artery disease include diabetes mellitus, dyslipidemia, hypertension, male sex and a sedentary lifestyle.  Hypertension This is a chronic problem. The current episode started more than 1 year ago. The problem has been gradually improving since onset. The problem is controlled. Associated symptoms include blurred vision and palpitations. Agents associated with hypertension include thyroid hormones. Risk factors for coronary artery disease include diabetes mellitus, dyslipidemia, sedentary lifestyle, smoking/tobacco exposure and male gender. Past treatments include angiotensin blockers, diuretics, beta blockers and alpha 1 blockers. The current treatment provides moderate improvement. There are no compliance problems.  Hypertensive end-organ damage includes kidney disease, CAD/MI and PVD. Identifiable causes of hypertension include chronic renal disease, sleep apnea and a thyroid problem.  Thyroid Problem Presents for follow-up visit. Symptoms include palpitations. Patient reports no anxiety, cold intolerance, constipation, depressed mood, diarrhea, dry skin, fatigue, heat intolerance, tremors or weight loss. (Having cardioversion on 7/1 for Afib) The symptoms have been stable. Past treatments include levothyroxine. The treatment provided moderate relief. His past medical history is significant for diabetes and hyperlipidemia. There are no known risk factors.     Review of systems  Constitutional: + stable body weight,  current Body mass index is 23.6 kg/m. , no fatigue, no subjective hyperthermia, no subjective hypothermia Eyes: no blurry vision, no xerophthalmia ENT: no sore throat, no nodules palpated in throat, no  dysphagia/odynophagia, no hoarseness Cardiovascular: no chest pain, no shortness of breath, no palpitations, no leg swelling Respiratory: no cough, no shortness of breath- finished treatment for lung cancer Gastrointestinal: no nausea/vomiting/diarrhea Musculoskeletal: no muscle/joint aches Skin: no rashes, no hyperemia Neurological: no tremors, + numbness/tingling to BLE, no dizziness Psychiatric: no depression, no anxiety  Objective:     BP 118/72 (BP Location: Left Arm, Patient Position: Sitting, Cuff Size: Normal)   Pulse (!) 109   Ht _0  (1.676 m)   Wt 146 lb 3.2 oz (66.3 kg)   BMI 23.60 kg/m   Wt Readings from Last 3 Encounters:  02/24/22 146 lb 3.2 oz (66.3 kg)  02/21/22 148 lb (67.1 kg)  02/18/22 149 lb 3.2 oz (67.7 kg)     BP Readings from Last 3 Encounters:  02/24/22 118/72  02/21/22 (!) 145/83  02/18/22 116/68      Physical Exam- Limited  Constitutional:  Body mass index is 23.6 kg/m. , not in acute distress, normal state of mind Eyes:  EOMI, no exophthalmos Neck: Supple Cardiovascular: RRR, no murmurs, rubs, or gallops, no edema Respiratory: Adequate breathing efforts, no crackles, rales, rhonchi, or wheezing Musculoskeletal: no gross deformities, strength intact in all four extremities, no gross restriction of joint movements Skin:  no rashes, no hyperemia Neurological: no tremor with outstretched hands  Diabetic Foot Exam - Simple   Simple Foot Form Diabetic Foot exam was performed with the following findings: Yes 02/24/2022  1:17 PM  Visual Inspection See comments: Yes Sensation Testing Intact to touch and monofilament testing bilaterally: Yes Pulse Check See comments: Yes Comments Decreased sensation to monofilament tool bilaterally, decreased pulses bilaterally, left foot multiple scabbed areas from previous dog bite.     CMP ( most recent) CMP     Component Value Date/Time   NA 136 02/21/2022 1614   K 4.4 02/21/2022 1614   CL 89 (L)  02/21/2022 1614   CO2 25 02/21/2022 1614   GLUCOSE 156 (H) 02/21/2022 1614   GLUCOSE 206 (H) 12/24/2021 1002  BUN 25 02/21/2022 1614   CREATININE 1.45 (H) 02/21/2022 1614   CALCIUM 9.9 02/21/2022 1614   PROT 7.6 02/21/2022 1614   ALBUMIN 4.1 02/21/2022 1614   AST 16 02/21/2022 1614   ALT 12 02/21/2022 1614   ALKPHOS 163 (H) 02/21/2022 1614   BILITOT 0.2 02/21/2022 1614   GFRNONAA >60 12/24/2021 1002   GFRAA 102 05/24/2020 1238     Diabetic Labs (most recent): Lab Results  Component Value Date   HGBA1C 7.8 (H) 01/28/2022   HGBA1C 5.4 08/25/2021   HGBA1C 5.4 08/23/2021   MICROALBUR 30 08/14/2021   MICROALBUR 30 09/24/2020     Lipid Panel ( most recent) Lipid Panel     Component Value Date/Time   CHOL 126 08/21/2020 0530   CHOL 136 04/25/2019 1608   TRIG 89 08/21/2020 0530   HDL 28 (L) 08/21/2020 0530   HDL 28 (L) 04/25/2019 1608   CHOLHDL 4.5 08/21/2020 0530   VLDL 18 08/21/2020 0530   LDLCALC 80 08/21/2020 0530   LDLCALC 78 04/25/2019 1608   LABVLDL 30 04/25/2019 1608      Lab Results  Component Value Date   TSH 1.960 11/25/2021   TSH 2.510 08/23/2021   TSH 2.686 04/18/2021   TSH 3.430 01/22/2021   TSH 5.033 (H) 12/17/2020   TSH 3.410 10/26/2020   TSH 0.628 08/21/2020   TSH 1.542 08/13/2020   TSH 2.180 08/03/2020   TSH 0.117 (L) 06/02/2020   FREET4 1.14 01/22/2021   FREET4 1.96 (H) 06/03/2020   FREET4 1.03 02/20/2017           Assessment & Plan:   1) Type 2 diabetes mellitus with diabetic polyneuropathy, without long-term current use of insulin ([place])  - John [name] has currently uncontrolled symptomatic type 2 DM since 76 years of age.  He presents today with his meter and logs showing above target glycemic profile overall.  His previsit A1c on 10/24 was 7.8% increasing from last visit.  He notes he had a steroid injection recently for a gout flare.  He denies any hypoglycemia.  He had stent placed in right leg since last visit and he will  be having cardiac ablation soon.  -Recent labs reviewed.  - I had a long discussion with him about the progressive nature of diabetes and the pathology behind its complications. -his diabetes is complicated by CKD stage 2, PAD, and neuropathy and he remains at a high risk for more acute and chronic complications which include CAD, CVA, and retinopathy. These are all discussed in detail with him.  - Nutritional counseling repeated at each appointment due to patients tendency to fall back in to old habits.  - The patient admits there is a room for improvement in their diet and drink choices. -  Suggestion is made for the patient to avoid simple carbohydrates from their diet including Cakes, Sweet Desserts / Pastries, Ice Cream, Soda (diet and regular), Sweet Tea, Candies, Chips, Cookies, Sweet Pastries, Store Bought Juices, Alcohol in Excess of 1-2 drinks a day, Artificial Sweeteners, Coffee Creamer, and "Sugar-free" Products. This will help patient to have stable blood glucose profile and potentially avoid unintended weight gain.   - I encouraged the patient to switch to unprocessed or minimally processed complex starch and increased protein intake (animal or plant source), fruits, and vegetables.   - Patient is advised to stick to a routine mealtimes to eat 3 meals a day and avoid unnecessary snacks (to snack only to correct hypoglycemia).  -  he will be scheduled with Jearld Fenton, RDN, CDE for diabetes education.  - I have approached him with the following individualized plan to manage  his diabetes and patient agrees:   -He will tolerate increase in his Rybelsus to 14 mg po daily before breakfast.  He can take 2 of his 7 mg tabs daily until he depletes his current supply.  -he is encouraged to continue monitoring blood glucose once daily, before breakfast and to call the clinic if he has readings less than 70 or greater than 200 for 3 tests in a row.   - Specific targets for  A1c;  LDL,  HDL,  and Triglycerides were discussed with the patient.  2) Blood Pressure /Hypertension:  his blood pressure is controlled to target (slightly low today).   he is advised to continue his current medications including Bystolic 2.5 mg po daily and Torsemide 40 mg po daily.  3) Lipids/Hyperlipidemia:    Review of his recent lipid panel from 08/21/20 showed controlled  LDL at 80.  he  is advised to continue Pravachol 40 mg daily at bedtime.  Side effects and precautions discussed with him.  4)  Weight/Diet:  his Body mass index is 23.6 kg/m.  -  he is is not a candidate for major weight loss. I discussed with him the fact that loss of 5 - 10% of his  current body weight will have the most impact on his diabetes management.  Exercise, and detailed carbohydrates information provided  -  detailed on discharge instructions.  5) Hypothyroidism: His previsit thyroid function tests show normal TSH (no free T4 level drawn).  He is advised to continue Levothyroxine 50 mcg po daily before breakfast.    - The correct intake of thyroid hormone (Levothyroxine, Synthroid), is on empty stomach first thing in the morning, with water, separated by at least 30 minutes from breakfast and other medications,  and separated by more than 4 hours from calcium, iron, multivitamins, acid reflux medications (PPIs).  - This medication is a life-long medication and will be needed to correct thyroid hormone imbalances for the rest of your life.  The dose may change from time to time, based on thyroid blood work.  - It is extremely important to be consistent taking this medication, near the same time each morning.  -AVOID TAKING PRODUCTS CONTAINING BIOTIN (commonly found in Hair, Skin, Nails vitamins) AS IT INTERFERES WITH THE VALIDITY OF THYROID FUNCTION BLOOD TESTS.  6) Chronic Care/Health Maintenance: -he is on ACEI/ARB and Statin medications and is encouraged to initiate and continue to follow up with Ophthalmology,  Dentist, Podiatrist at least yearly or according to recommendations, and advised to [place]. I have recommended yearly flu vaccine and pneumonia vaccine at least every 5 years; moderate intensity exercise for up to 150 minutes weekly; and sleep for at least 7 hours a day.  - he is advised to maintain close follow up with [name], FNP for primary care needs, as well as his other providers for optimal and coordinated care.      I spent 30 minutes in the care of the patient today including review of labs from [facility], Lipids, Thyroid Function, Hematology (current and previous including abstractions from other facilities); face-to-face time discussing  his blood glucose readings/logs, discussing hypoglycemia and hyperglycemia episodes and symptoms, medications doses, his options of short and long term treatment based on the latest standards of care / guidelines;  discussion about incorporating lifestyle medicine;  and documenting  the encounter. Risk reduction counseling performed per USPSTF guidelines to reduce obesity and cardiovascular risk factors.     Please refer to Patient Instructions for Blood Glucose Monitoring and Insulin/Medications Dosing Guide"  in media tab for additional information. Please  also refer to " Patient Self Inventory" in the Media  tab for reviewed elements of pertinent patient history.  Frederica Kuster participated in the discussions, expressed understanding, and voiced agreement with the above plans.  All questions were answered to his satisfaction. he is encouraged to contact clinic should he have any questions or concerns prior to his return visit.   Follow up plan: - Return in about 6 months (around 08/25/2022) for Diabetes F/U, Bring meter and logs, No previsit labs.  Rayetta Pigg, [facility] [address] [place], [place] [address] Phone: [phone] Fax: [phone]  02/24/2022, 1:22 PM

## 2022-02-24 NOTE — Patient Instructions (Signed)

## 2022-03-03 ENCOUNTER — Ambulatory Visit (INDEPENDENT_AMBULATORY_CARE_PROVIDER_SITE_OTHER): Payer: Medicare Other | Admitting: Family

## 2022-03-03 ENCOUNTER — Encounter: Payer: Self-pay | Admitting: Family

## 2022-03-03 VITALS — BP 107/69 | HR 117 | Temp 97.5°F | Ht 66.0 in | Wt 147.0 lb

## 2022-03-03 DIAGNOSIS — R7989 Other specified abnormal findings of blood chemistry: Secondary | ICD-10-CM

## 2022-03-03 DIAGNOSIS — K5900 Constipation, unspecified: Secondary | ICD-10-CM | POA: Diagnosis not present

## 2022-03-03 DIAGNOSIS — M109 Gout, unspecified: Secondary | ICD-10-CM

## 2022-03-03 DIAGNOSIS — D509 Iron deficiency anemia, unspecified: Secondary | ICD-10-CM

## 2022-03-03 DIAGNOSIS — I5033 Acute on chronic diastolic (congestive) heart failure: Secondary | ICD-10-CM | POA: Diagnosis not present

## 2022-03-03 MED ORDER — TORSEMIDE 20 MG PO TABS: 20.0000 mg | ORAL_TABLET | Freq: Every day | ORAL | 2 refills | Status: DC

## 2022-03-03 NOTE — Progress Notes (Signed)
Subjective:    Patient ID: John [name], male    DOB: Jun 07, 1945, 76 y.o.   MRN: [id number]  Chief Complaint  Patient presents with   Follow-up   Constipation   Nausea   PT presents to the office today recheck elevated creatinine. He was seen on 02/21/22 and for gout in  left hand. He took one dose of Ultram.    He has iron deficiency and takes iron. States this is causing constipation. He is taking Linzess 72 mcg every other day.  Constipation This is a chronic problem. The current episode started more than 1 year ago. The problem has been waxing and waning since onset. His stool frequency is 2 to 3 times per week. He has tried laxatives for the symptoms. The treatment provided mild relief.  Congestive Heart Failure Presents for follow-up visit. Associated symptoms include fatigue. Pertinent negatives include no edema. The symptoms have been stable.      Review of Systems  Constitutional:  Positive for fatigue.  Gastrointestinal:  Positive for constipation.  All other systems reviewed and are negative.      Objective:   Physical Exam Vitals reviewed.  Constitutional:      General: He is not in acute distress.    Appearance: He is well-developed. He is obese.  HENT:     Head: Normocephalic.     Right Ear: Tympanic membrane normal.     Left Ear: Tympanic membrane normal.  Eyes:     General:        Right eye: No discharge.        Left eye: No discharge.     Pupils: Pupils are equal, round, and reactive to light.  Neck:     Thyroid: No thyromegaly.  Cardiovascular:     Rate and Rhythm: Normal rate and regular rhythm.     Heart sounds: Normal heart sounds. No murmur heard. Pulmonary:     Effort: Pulmonary effort is normal. No respiratory distress.     Breath sounds: Normal breath sounds. No wheezing.  Abdominal:     General: Bowel sounds are normal. There is no distension.     Palpations: Abdomen is soft.     Tenderness: There is no abdominal tenderness.   Musculoskeletal:        General: No tenderness. Normal range of motion.     Cervical back: Normal range of motion and neck supple.  Skin:    General: Skin is warm and dry.     Findings: No erythema or rash.  Neurological:     Mental Status: He is alert and oriented to person, place, and time.     Cranial Nerves: No cranial nerve deficit.     Deep Tendon Reflexes: Reflexes are normal and symmetric.  Psychiatric:        Behavior: Behavior normal.        Thought Content: Thought content normal.        Judgment: Judgment normal.      BP 107/69   Pulse (!) 117   Temp (!) 97.5 F (36.4 C) (Temporal)   Ht _0  (1.676 m)   Wt 147 lb (66.7 kg)   SpO2 96%   BMI 23.73 kg/m       Assessment & Plan:  John [name] comes in today with chief complaint of Follow-up (Creatinine elevated- Avoid NSAID's, need to follow up with me on this. May need to decrease dexadex.), Constipation, and Nausea   Diagnosis and orders addressed:  1. Elevated serum creatinine  - CMP14+EGFR - CBC with Differential/Platelet  2. Acute gout of left hand, unspecified cause Resolved - CMP14+EGFR - CBC with Differential/Platelet  3. Constipation, unspecified constipation type Start taking linzess every day, if not improved may need to increase dose - CMP14+EGFR - CBC with Differential/Platelet  4. Iron deficiency anemia, unspecified iron deficiency anemia type Continue iron - CMP14+EGFR - CBC with Differential/Platelet  5. Acute on chronic diastolic CHF (congestive heart failure) (HCC) Will decrease demadex to 20 mg daily and if any edema or weight gain of >2 lbs in one day or >5 lbs in a week take an extra one - CMP14+EGFR - CBC with Differential/Platelet   Labs pending Health Maintenance reviewed Diet and exercise encouraged  Follow up plan: 1 month   Evelina Dun, FNP

## 2022-03-03 NOTE — Patient Instructions (Signed)

## 2022-03-04 ENCOUNTER — Other Ambulatory Visit: Payer: Self-pay | Admitting: Family

## 2022-03-04 ENCOUNTER — Other Ambulatory Visit (INDEPENDENT_AMBULATORY_CARE_PROVIDER_SITE_OTHER): Payer: Medicare Other

## 2022-03-04 ENCOUNTER — Other Ambulatory Visit: Payer: Self-pay | Admitting: Family Medicine

## 2022-03-04 DIAGNOSIS — D72829 Elevated white blood cell count, unspecified: Secondary | ICD-10-CM

## 2022-03-04 LAB — CBC WITH DIFFERENTIAL/PLATELET
Basophils Absolute: 0.1 10*3/uL (ref 0.0–0.2)
Basos: 1 %
EOS (ABSOLUTE): 0.2 10*3/uL (ref 0.0–0.4)
Eos: 1 %
Hematocrit: 43.5 % (ref 37.5–51.0)
Hemoglobin: 15.4 g/dL (ref 13.0–17.7)
Immature Grans (Abs): 0.1 10*3/uL (ref 0.0–0.1)
Immature Granulocytes: 1 %
Lymphocytes Absolute: 0.8 10*3/uL (ref 0.7–3.1)
Lymphs: 5 %
MCH: 33.2 pg — ABNORMAL HIGH (ref 26.6–33.0)
MCHC: 35.4 g/dL (ref 31.5–35.7)
MCV: 94 fL (ref 79–97)
Monocytes Absolute: 1.5 10*3/uL — ABNORMAL HIGH (ref 0.1–0.9)
Monocytes: 9 %
Neutrophils Absolute: 14 10*3/uL — ABNORMAL HIGH (ref 1.4–7.0)
Neutrophils: 83 %
Platelets: 447 10*3/uL (ref 150–450)
RBC: 4.64 x10E6/uL (ref 4.14–5.80)
RDW: 14.1 % (ref 11.6–15.4)
WBC: 16.6 10*3/uL — ABNORMAL HIGH (ref 3.4–10.8)

## 2022-03-04 LAB — CMP14+EGFR
ALT: 15 IU/L (ref 0–44)
AST: 15 IU/L (ref 0–40)
Albumin/Globulin Ratio: 1.3 (ref 1.2–2.2)
Albumin: 4.3 g/dL (ref 3.8–4.8)
Alkaline Phosphatase: 163 IU/L — ABNORMAL HIGH (ref 44–121)
BUN/Creatinine Ratio: 24 (ref 10–24)
BUN: 40 mg/dL — ABNORMAL HIGH (ref 8–27)
Bilirubin Total: 0.3 mg/dL (ref 0.0–1.2)
CO2: 27 mmol/L (ref 20–29)
Calcium: 9.9 mg/dL (ref 8.6–10.2)
Chloride: 87 mmol/L — ABNORMAL LOW (ref 96–106)
Creatinine, Ser: 1.64 mg/dL — ABNORMAL HIGH (ref 0.76–1.27)
Globulin, Total: 3.3 g/dL (ref 1.5–4.5)
Glucose: 219 mg/dL — ABNORMAL HIGH (ref 70–99)
Potassium: 4.9 mmol/L (ref 3.5–5.2)
Sodium: 133 mmol/L — ABNORMAL LOW (ref 134–144)
Total Protein: 7.6 g/dL (ref 6.0–8.5)
eGFR: 43 mL/min/{1.73_m2} — ABNORMAL LOW (ref >59)

## 2022-03-04 MED ORDER — AMOXICILLIN-POT CLAVULANATE 875-125 MG PO TABS: 1.0000 | ORAL_TABLET | Freq: Two times a day (BID) | ORAL | 0 refills | Status: DC

## 2022-03-04 MED ORDER — AZITHROMYCIN 250 MG PO TABS: ORAL_TABLET | ORAL | 0 refills | Status: DC

## 2022-03-07 ENCOUNTER — Telehealth: Payer: Self-pay | Admitting: *Deleted

## 2022-03-07 NOTE — Telephone Encounter (Signed)
Incoming call regarding patient John [name] from Ginette Otto.  I was able to speak wth Randell Loop as she is listed on patients signed DPR as someone I can share medical information.  She reports patient is not feeling well and at times not making sense, She initially called asking if it was okay to give him Tylenol. As I looked a his chart it indicated that NSAIDS should be avoided.  I enlisted the help of [employer] [place]. She knows the patient as he is seen by the provider that she works with. She revealed that he has an infection that is not getting better despite the administration of 2 antibiotics. Since patient is showing signs of confusion, she strongly advised EMS or if able to drive patient to the ED. Patients friend agreed to get him to ED

## 2022-03-18 NOTE — Progress Notes (Unsigned)
Cardiology Office Note:   Date:  03/20/2022  NAME:  John [name]    MRN: [id number] DOB:  06/20/1945   PCP:  [name], FNP  Cardiologist:  Evalina Field, MD  Electrophysiologist:  Vickie Epley, MD   Referring MD: [name], FNP   Chief Complaint  Patient presents with   Follow-up        History of Present Illness:   John [name] is a 76 y.o. male with a hx of HFpEF, AFL s/p ablation, PAD, COPD, GI bleed who presents for follow-up.  EKG shows sinus tachycardia.  Denies any chest pain or trouble breathing.  He was seen by his primary care physician for elevated white blood cell count.  No infectious source was found.  Chest x-ray with lung scarring and no evidence of pulmonary edema.  He has no swelling.  His torsemide has been reduced to 1 daily.  He is still on Eliquis.  We discussed the Watchman procedure.  He may be interested.  He has plans to have a loop recorder placed by electrophysiology.  Overall denies any congestive heart failure symptoms.  Seems to be doing better.  Problem List 1. Coronary calcifications 2. Atrial flutter -Dx 08/21/2020 -CHADSVASC=5 (age, DM, PAD, HTN, CHF) -CTI ablation 01/13/2022 3. PAD -R femoral endarterectomy 08/20/2020 -R common iliac artery stent 08/20/2020 4. Non-small cell lung CA -2018 s/p chemo/radiation  -recurrent nodules without recurrence  5. COPD 6. Tobacco abuse 7. Gastric ulcer/Recurrent anemia -09/2021: radiation gastritis  -09/2021: colonic AVM -10/2021: gastric AVM/duodenal AVM 8. DM -A1c 7.8 9. HLD -T chol 126, HDL 28, LDL 80, TG 89 10. HFpEF 11. 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   Anemia    Anxiety    Arthritis    Asthma    Atrial flutter (HCC)    BPH (benign prostatic hyperplasia)    with urinary retention 02/06/20   CHF (congestive heart failure) (HCC)    COPD (chronic obstructive pulmonary disease)  ([place])    Depression    Diabetes mellitus, type II ([place])    Dyspnea    History of kidney stones    History of radiation therapy    right lung 09/10/2020-09/17/2020   Dr Sondra Come   Hyperlipidemia    Hypertension    Hypothyroidism    Macular degeneration    Neuropathy    Non-small cell lung cancer, right ([place]) 04/18/2016   Peripheral vascular disease ([place])    Prostatitis    Pulmonary nodule, left 07/16/2016   Sleep apnea    cpap    Past Surgical History: Past Surgical History:  Procedure Laterality Date   A-FLUTTER ABLATION N/A 01/13/2022   Procedure: A-FLUTTER ABLATION;  Surgeon: Vickie Epley, MD;  Location: [place] [facility];  Service: Cardiovascular;  Laterality: N/A;   ABDOMINAL AORTOGRAM W/LOWER EXTREMITY Left 02/06/2020   Procedure: ABDOMINAL AORTOGRAM W/LOWER EXTREMITY;  Surgeon: Lorretta Harp, MD;  Location: [place] [facility];  Service: Cardiovascular;  Laterality: Left;   ABDOMINAL AORTOGRAM W/LOWER EXTREMITY N/A 05/30/2021   Procedure: ABDOMINAL AORTOGRAM W/LOWER EXTREMITY;  Surgeon: Marty Heck, MD;  Location: [place] [facility];  Service: Cardiovascular;  Laterality: N/A;   BIOPSY  09/16/2021   Procedure: BIOPSY;  Surgeon: Gatha Mayer, MD;  Location: [place];  Service: Gastroenterology;;   CARDIOVERSION N/A 10/05/2020   Procedure: CARDIOVERSION;  Surgeon: Geralynn Rile, MD;  Location: [place] [facility];  Service: Cardiovascular;  Laterality: N/A;   CATARACT EXTRACTION, BILATERAL Bilateral    COLONOSCOPY WITH PROPOFOL N/A 06/20/2021   Procedure: COLONOSCOPY WITH PROPOFOL;  Surgeon: Irene Shipper, MD;  Location: [facility];  Service: Endoscopy;  Laterality: N/A;   COLONOSCOPY WITH PROPOFOL N/A 09/16/2021   Procedure: COLONOSCOPY WITH PROPOFOL;  Surgeon: Gatha Mayer, MD;  Location: [place];  Service: Gastroenterology;  Laterality: N/A;   ENDARTERECTOMY FEMORAL Right 08/20/2020   Procedure: ENDARTERECTOMY  RIGHT FEMORAL ARTERY;  Surgeon:  Marty Heck, MD;  Location: [place];  Service: Vascular;  Laterality: Right;   ENTEROSCOPY N/A 10/11/2021   Procedure: ENTEROSCOPY;  Surgeon: Harvel Quale, MD;  [facility];  Service: Gastroenterology;  Laterality: N/A;   ESOPHAGOGASTRODUODENOSCOPY N/A 09/16/2021   Procedure: ESOPHAGOGASTRODUODENOSCOPY (EGD);  Surgeon: Gatha Mayer, MD;  Location: [place];  Service: Gastroenterology;  Laterality: N/A;   ESOPHAGOGASTRODUODENOSCOPY (EGD) WITH PROPOFOL N/A 09/06/2020   Procedure: ESOPHAGOGASTRODUODENOSCOPY (EGD) WITH PROPOFOL;  Surgeon: Irene Shipper, MD;  Location: [facility];  Service: Endoscopy;  Laterality: N/A;   ESOPHAGOGASTRODUODENOSCOPY (EGD) WITH PROPOFOL N/A 10/11/2021   Procedure: ESOPHAGOGASTRODUODENOSCOPY (EGD) WITH PROPOFOL;  Surgeon: Harvel Quale, MD;  [facility];  Service: Gastroenterology;  Laterality: N/A;   HEMOSTASIS CLIP PLACEMENT  09/16/2021   Procedure: HEMOSTASIS CLIP PLACEMENT;  Surgeon: Gatha Mayer, MD;  Location: [place];  Service: Gastroenterology;;   HOT HEMOSTASIS N/A 09/16/2021   Procedure: HOT HEMOSTASIS (ARGON PLASMA COAGULATION/BICAP);  Surgeon: Gatha Mayer, MD;  Location: [place];  Service: Gastroenterology;  Laterality: N/A;   HOT HEMOSTASIS  10/11/2021   Procedure: HOT HEMOSTASIS (ARGON PLASMA COAGULATION/BICAP);  Surgeon: Montez Morita, Quillian Quince, MD;  [facility];  Service: Gastroenterology;;   INSERTION OF ILIAC STENT Right 08/20/2020   Procedure: RETROGRADE INSERTION OF RIGHT ILIAC STENT;  Surgeon: Marty Heck, MD;  Location: [place];  Service: Vascular;  Laterality: Right;   INTRAOPERATIVE ARTERIOGRAM Right 08/20/2020   Procedure: INTRA OPERATIVE ARTERIOGRAM ILIAC;  Surgeon: Marty Heck, MD;  Location: [place];  Service: Vascular;  Laterality: Right;   PATCH ANGIOPLASTY Right 08/20/2020   Procedure: PATCH ANGIOPLASTY RIGHT FEMORAL ARTERY;  Surgeon: Marty Heck, MD;  Location: [place];  Service: Vascular;  Laterality: Right;   PERIPHERAL VASCULAR BALLOON ANGIOPLASTY Right 05/30/2021   Procedure: PERIPHERAL VASCULAR BALLOON ANGIOPLASTY;  Surgeon: Marty Heck, MD;  Location: [place] [facility];  Service: Cardiovascular;  Laterality: Right;   PORTACATH PLACEMENT Left 06/13/2016   Procedure: INSERTION PORT-A-CATH;  Surgeon: Aviva Signs, MD;  [facility];  Service: General;  Laterality: Left;   TRANSURETHRAL RESECTION OF PROSTATE N/A 05/31/2020   Procedure: TRANSURETHRAL RESECTION OF THE PROSTATE (TURP);  Surgeon: Cleon Gustin, MD;  [facility];  Service: Urology;  Laterality: N/A;   VIDEO BRONCHOSCOPY WITH ENDOBRONCHIAL NAVIGATION N/A 05/28/2016   Procedure: VIDEO BRONCHOSCOPY WITH ENDOBRONCHIAL NAVIGATION;  Surgeon: [name], MD;  Location: [place];  Service: Thoracic;  Laterality: N/A;   VIDEO BRONCHOSCOPY WITH ENDOBRONCHIAL ULTRASOUND N/A 05/28/2016   Procedure: VIDEO BRONCHOSCOPY WITH ENDOBRONCHIAL ULTRASOUND;  Surgeon: [name], MD;  [facility]: [facility];  Service: Thoracic;  Laterality: N/A;    Current Medications: Current Meds  Medication Sig   apixaban (ELIQUIS) 5 MG TABS tablet Take 1 tablet (5 mg total) by mouth 2 (two) times daily.   Blood Glucose Monitoring Suppl (ONETOUCH VERIO REFLECT) w/Device KIT Use to test blood  sugars daily as directed. DX: E11.9   [employer] IRON 325 (65 Fe) MG tablet TAKE 1 TABLET (325MG) TWICE DAILY   dutasteride (AVODART) 0.5 MG capsule Take 1 capsule (0.5 mg total) by mouth daily.   ezetimibe (ZETIA) 10 MG tablet Take 10 mg by mouth daily.   famotidine (PEPCID) 20 MG tablet One after supper (Patient taking differently: Take 20 mg by mouth daily with supper.)   fluticasone (FLONASE) 50 MCG/ACT nasal spray PLACE 1 SPRAY INTO BOTH NOSTRILS DAILY AS NEEDED FOR ALLERGIES OR RHINITIS.   Fluticasone-Umeclidin-Vilant (TRELEGY ELLIPTA) 100-62.5-25 MCG/ACT AEPB Inhale 1 puff into  the lungs daily.   gabapentin (NEURONTIN) 300 MG capsule TAKE 1 CAPSULE BY MOUTH THREE TIMES A DAY   KLOR-CON M20 20 MEQ tablet TAKE 1 TABLET BY MOUTH EVERY DAY   levothyroxine (SYNTHROID) 50 MCG tablet TAKE 1 TABLET BY MOUTH EVERY DAY   linaclotide (LINZESS) 72 MCG capsule Take 1 capsule (72 mcg total) by mouth daily as needed (constipation).   metolazone (ZAROXOLYN) 2.5 MG tablet Take 2.5 mg by mouth every Monday, Wednesday, and Friday.   NON FORMULARY CPAP at bedtime   ondansetron (ZOFRAN) 4 MG tablet Take 1 tablet (4 mg total) by mouth every 8 (eight) hours as needed for nausea or vomiting.   OneTouch Delica Lancets 95A MISC Use to test blood sugars daily as directed. DX: E11.9   ONETOUCH VERIO test strip USE TO TEST BLOOD SUGARS DAILY AS DIRECTED. DX: E11.9   OXYGEN Inhale 2 L into the lungs daily as needed (with Cpap at night).   pantoprazole (PROTONIX) 40 MG tablet Take 1 tablet (40 mg total) by mouth 2 (two) times daily before a meal.   pravastatin (PRAVACHOL) 40 MG tablet TAKE 1 TABLET (40 MG TOTAL) BY MOUTH ON MONDAYS , WEDNESDAYS, AND FRIDAYS AS DIRECTED   Semaglutide (RYBELSUS) 14 MG TABS Take 1 tablet (14 mg total) by mouth daily.   spironolactone (ALDACTONE) 25 MG tablet Take 1 tablet (25 mg total) by mouth daily.   torsemide (DEMADEX) 20 MG tablet Take 1 tablet (20 mg total) by mouth daily.   zolpidem (AMBIEN) 5 MG tablet Take 1 tablet (5 mg total) by mouth at bedtime as needed for sleep.     Allergies:    Sulfa antibiotics, Atorvastatin, Jardiance [empagliflozin], Lopressor [metoprolol], Rosuvastatin, Doxycycline, and Temazepam   Social History: Social History   Socioeconomic History   Marital status: Soil scientist    Spouse name: Not on file   Number of children: 5   Years of education: 10   Highest education level: 10th grade  Occupational History   Occupation: Retired  Tobacco Use   Smoking status: Every Day    Packs/day: 0.50    Years: 55.00    Total pack  years: 27.50    Types: Cigarettes    Start date: 03/11/1961   Smokeless tobacco: Never   Tobacco comments:    1/2 pack to 1 pack per day 12/14/19  Vaping Use   Vaping Use: Never used  Substance and Sexual Activity   Alcohol use: No   Drug use: No   Sexual activity: Yes    Birth control/protection: None  Other Topics Concern   Not on file  Social History Narrative   [name] is his POA and lives with him. Children out of town - one in [facility], others in [place].   Social Determinants of Health   Financial Resource Strain: Low Risk  (11/15/2021)   Overall Financial  Resource Strain (CARDIA)    Difficulty of Paying Living Expenses: Not hard at all  Food Insecurity: No Food Insecurity (11/15/2021)   Hunger Vital Sign    Worried About Running Out of Food in the Last Year: Never true    Ran Out of Food in the Last Year: Never true  Transportation Needs: No Transportation Needs (11/15/2021)   PRAPARE - Hydrologist (Medical): No    Lack of Transportation (Non-Medical): No  Physical Activity: Inactive (11/15/2021)   Exercise Vital Sign    Days of Exercise per Week: 0 days    Minutes of Exercise per Session: 0 min  Stress: No Stress Concern Present (11/15/2021)   Ong    Feeling of Stress : Only a little  Social Connections: Moderately Isolated (11/15/2021)   Social Connection and Isolation Panel [NHANES]    Frequency of Communication with Friends and Family: More than three times a week    Frequency of Social Gatherings with Friends and Family: Once a week    Attends Religious Services: Never    Marine scientist or Organizations: No    Attends Music therapist: Never    Marital Status: Living with partner     Family History: The patient's family history includes Diabetes in his father; Heart disease in his father; Hypertension in his mother and sister; Stroke in his  father.  ROS:   All other ROS reviewed and negative. Pertinent positives noted in the HPI.     EKGs/Labs/Other Studies Reviewed:   The following studies were personally reviewed by me today:  EKG:  EKG is ordered today.  The ekg ordered today demonstrates Sinus tachycardia heart rate 102, incomplete right bundle branch block, inferior infarct Recent Labs:, and was personally reviewed by me.   10/10/2021: B Natriuretic Peptide 191.0 11/25/2021: TSH 1.960 12/24/2021: Magnesium 2.2 03/03/2022: ALT 15; BUN 40; Creatinine, Ser 1.64; Hemoglobin 15.4; Platelets 447; Potassium 4.9; Sodium 133   Recent Lipid Panel    Component Value Date/Time   CHOL 126 08/21/2020 0530   CHOL 136 04/25/2019 1608   TRIG 89 08/21/2020 0530   HDL 28 (L) 08/21/2020 0530   HDL 28 (L) 04/25/2019 1608   CHOLHDL 4.5 08/21/2020 0530   VLDL 18 08/21/2020 0530   LDLCALC 80 08/21/2020 0530   LDLCALC 78 04/25/2019 1608    Physical Exam:   VS:  BP 122/62   Pulse (!) 102   Ht _0  (1.676 m)   Wt 146 lb 12.8 oz (66.6 kg)   SpO2 94%   BMI 23.69 kg/m    Wt Readings from Last 3 Encounters:  03/20/22 146 lb 12.8 oz (66.6 kg)  03/20/22 148 lb (67.1 kg)  03/03/22 147 lb (66.7 kg)    General: Well nourished, well developed, in no acute distress Head: Atraumatic, normal size  Eyes: PEERLA, EOMI  Neck: Supple, no JVD Endocrine: No thryomegaly Cardiac: Normal S1, S2; RRR; no murmurs, rubs, or gallops Lungs: Clear to auscultation bilaterally, no wheezing, rhonchi or rales  Abd: Soft, nontender, no hepatomegaly  Ext: No edema, pulses 2+ Musculoskeletal: No deformities, BUE and BLE strength normal and equal Skin: Warm and dry, no rashes   Neuro: Alert and oriented to person, place, time, and situation, CNII-XII grossly intact, no focal deficits  Psych: Normal mood and affect   ASSESSMENT:   John [name] is a 76 y.o. male who presents for the  following: 1. Typical atrial flutter ([place])   2. Chronic diastolic  heart failure ([place])   3. Primary hypertension   4. PAD (peripheral artery disease) ([place])   5. Hyperlipidemia, unspecified hyperlipidemia type     PLAN:   1. Typical atrial flutter (HCC) -Status post typical atrial flutter ablation.  Maintaining sinus rhythm.  Denies chest pain or trouble breathing.  Overall doing well.  On Eliquis 5 mg twice daily.  Has had recurrent GI bleed due to AVM.  May be a good candidate for Watchman procedure.  Will discuss this with the EP.  2. Chronic diastolic heart failure (HCC) -Euvolemic on examination.  Continue with torsemide 20 mg daily.  On Aldactone 25 mg daily.  His blood pressure is well-controlled.  3. Primary hypertension -Well-controlled.  4. PAD (peripheral artery disease) (HCC) -Extensive history of PAD.  Not on aspirin since he is on Eliquis.  He has had issues with Lipitor and Crestor.  Currently on pravastatin 40 on Mondays Wednesdays and Fridays and Zetia.  LDL goal less than 70.  LDL is not far off.  Continue current medications.  5. Hyperlipidemia, unspecified hyperlipidemia type -Medications as above.  Disposition: Return in about 1 year (around 03/21/2023).  Medication Adjustments/Labs and Tests Ordered: Current medicines are reviewed at length with the patient today.  Concerns regarding medicines are outlined above.  Orders Placed This Encounter  Procedures   EKG 12-Lead   No orders of the defined types were placed in this encounter.   Patient Instructions  Medication Instructions:  The current medical regimen is effective;  continue present plan and medications.  *If you need a refill on your cardiac medications before your next appointment, please call your pharmacy*   Follow-Up: At [facility], you and your health needs are our priority.  As part of our continuing mission to provide you with exceptional heart care, we have created designated Provider Care Teams.  These Care Teams include your primary  Cardiologist (physician) and Advanced Practice Providers (APPs -  Physician Assistants and Nurse Practitioners) who all work together to provide you with the care you need, when you need it.  We recommend signing up for the patient portal called "MyChart".  Sign up information is provided on this After Visit Summary.  MyChart is used to connect with patients for Virtual Visits (Telemedicine).  Patients are able to view lab/test results, encounter notes, upcoming appointments, etc.  Non-urgent messages can be sent to your provider as well.   To learn more about what you can do with MyChart, go to [email or web address].    Your next appointment:   12 month(s)  The format for your next appointment:   In Person  Provider:   Evalina Field, MD            Time Spent with Patient: I have spent a total of 25 minutes with patient reviewing hospital notes, telemetry, EKGs, labs and examining the patient as well as establishing an assessment and plan that was discussed with the patient.  > 50% of time was spent in direct patient care.  Signed, Addison Naegeli. Audie Box, MD, [place]  [address], [place], [place] [address] [phone]  03/20/2022 2:30 PM

## 2022-03-20 ENCOUNTER — Ambulatory Visit: Payer: Medicare Other | Attending: Cardiovascular Disease | Admitting: Cardiovascular Disease

## 2022-03-20 ENCOUNTER — Ambulatory Visit (INDEPENDENT_AMBULATORY_CARE_PROVIDER_SITE_OTHER): Payer: Medicare Other | Admitting: Family

## 2022-03-20 ENCOUNTER — Encounter: Payer: Self-pay | Admitting: Family

## 2022-03-20 ENCOUNTER — Encounter: Payer: Self-pay | Admitting: Cardiovascular Disease

## 2022-03-20 VITALS — BP 129/80 | HR 99 | Temp 97.9°F | Ht 66.0 in | Wt 148.0 lb

## 2022-03-20 VITALS — BP 122/62 | HR 102 | Ht 66.0 in | Wt 146.8 lb

## 2022-03-20 DIAGNOSIS — I483 Typical atrial flutter: Secondary | ICD-10-CM | POA: Diagnosis not present

## 2022-03-20 DIAGNOSIS — I5032 Chronic diastolic (congestive) heart failure: Secondary | ICD-10-CM | POA: Diagnosis not present

## 2022-03-20 DIAGNOSIS — I4892 Unspecified atrial flutter: Secondary | ICD-10-CM

## 2022-03-20 DIAGNOSIS — I10 Essential (primary) hypertension: Secondary | ICD-10-CM | POA: Diagnosis not present

## 2022-03-20 DIAGNOSIS — K219 Gastro-esophageal reflux disease without esophagitis: Secondary | ICD-10-CM

## 2022-03-20 DIAGNOSIS — E1169 Type 2 diabetes mellitus with other specified complication: Secondary | ICD-10-CM | POA: Diagnosis not present

## 2022-03-20 DIAGNOSIS — J449 Chronic obstructive pulmonary disease, unspecified: Secondary | ICD-10-CM

## 2022-03-20 DIAGNOSIS — E785 Hyperlipidemia, unspecified: Secondary | ICD-10-CM | POA: Diagnosis not present

## 2022-03-20 DIAGNOSIS — I152 Hypertension secondary to endocrine disorders: Secondary | ICD-10-CM

## 2022-03-20 DIAGNOSIS — Z720 Tobacco use: Secondary | ICD-10-CM

## 2022-03-20 DIAGNOSIS — E782 Mixed hyperlipidemia: Secondary | ICD-10-CM | POA: Diagnosis not present

## 2022-03-20 DIAGNOSIS — I739 Peripheral vascular disease, unspecified: Secondary | ICD-10-CM | POA: Diagnosis not present

## 2022-03-20 DIAGNOSIS — E1159 Type 2 diabetes mellitus with other circulatory complications: Secondary | ICD-10-CM

## 2022-03-20 DIAGNOSIS — E039 Hypothyroidism, unspecified: Secondary | ICD-10-CM | POA: Diagnosis not present

## 2022-03-20 DIAGNOSIS — M159 Polyosteoarthritis, unspecified: Secondary | ICD-10-CM

## 2022-03-20 DIAGNOSIS — I5033 Acute on chronic diastolic (congestive) heart failure: Secondary | ICD-10-CM

## 2022-03-20 DIAGNOSIS — F17210 Nicotine dependence, cigarettes, uncomplicated: Secondary | ICD-10-CM | POA: Diagnosis not present

## 2022-03-20 DIAGNOSIS — G4733 Obstructive sleep apnea (adult) (pediatric): Secondary | ICD-10-CM | POA: Diagnosis not present

## 2022-03-20 DIAGNOSIS — D509 Iron deficiency anemia, unspecified: Secondary | ICD-10-CM | POA: Diagnosis not present

## 2022-03-20 DIAGNOSIS — F5101 Primary insomnia: Secondary | ICD-10-CM | POA: Diagnosis not present

## 2022-03-20 DIAGNOSIS — E1142 Type 2 diabetes mellitus with diabetic polyneuropathy: Secondary | ICD-10-CM

## 2022-03-20 MED ORDER — ZOLPIDEM TARTRATE 5 MG PO TABS: 5.0000 mg | ORAL_TABLET | Freq: Every evening | ORAL | 2 refills | Status: DC | PRN

## 2022-03-20 NOTE — Patient Instructions (Signed)
Health Maintenance After Age 76 After age 76, you are at a higher risk for certain long-term diseases and infections as well as injuries from falls. Falls are a major cause of broken bones and head injuries in people who are older than age 76. Getting regular preventive care can help to keep you healthy and well. Preventive care includes getting regular testing and making lifestyle changes as recommended by your health care provider. Talk with your health care provider about: Which screenings and tests you should have. A screening is a test that checks for a disease when you have no symptoms. A diet and exercise plan that is right for you. What should I know about screenings and tests to prevent falls? Screening and testing are the best ways to find a health problem early. Early diagnosis and treatment give you the best chance of managing medical conditions that are common after age 76. Certain conditions and lifestyle choices may make you more likely to have a fall. Your health care provider may recommend: Regular vision checks. Poor vision and conditions such as cataracts can make you more likely to have a fall. If you wear glasses, make sure to get your prescription updated if your vision changes. Medicine review. Work with your health care provider to regularly review all of the medicines you are taking, including over-the-counter medicines. Ask your health care provider about any side effects that may make you more likely to have a fall. Tell your health care provider if any medicines that you take make you feel dizzy or sleepy. Strength and balance checks. Your health care provider may recommend certain tests to check your strength and balance while standing, walking, or changing positions. Foot health exam. Foot pain and numbness, as well as not wearing proper footwear, can make you more likely to have a fall. Screenings, including: Osteoporosis screening. Osteoporosis is a condition that causes  the bones to get weaker and break more easily. Blood pressure screening. Blood pressure changes and medicines to control blood pressure can make you feel dizzy. Depression screening. You may be more likely to have a fall if you have a fear of falling, feel depressed, or feel unable to do activities that you used to do. Alcohol use screening. Using too much alcohol can affect your balance and may make you more likely to have a fall. Follow these instructions at home: Lifestyle Do not drink alcohol if: Your health care provider tells you not to drink. If you drink alcohol: Limit how much you have to: 0-1 drink a day for women. 0-2 drinks a day for men. Know how much alcohol is in your drink. In the [place] one drink equals one 12 oz bottle of beer (355 mL), one 5 oz glass of wine (148 mL), or one 1 oz glass of hard liquor (44 mL). Do not use any products that contain nicotine or tobacco. These products include cigarettes, chewing tobacco, and vaping devices, such as e-cigarettes. If you need help quitting, ask your health care provider. Activity  Follow a regular exercise program to stay fit. This will help you maintain your balance. Ask your health care provider what types of exercise are appropriate for you. If you need a cane or walker, use it as recommended by your health care provider. Wear supportive shoes that have nonskid soles. Safety  Remove any tripping hazards, such as rugs, cords, and clutter. Install safety equipment such as grab bars in bathrooms and safety rails on stairs. Keep rooms and walkways   well-lit. General instructions Talk with your health care provider about your risks for falling. Tell your health care provider if: You fall. Be sure to tell your health care provider about all falls, even ones that seem minor. You feel dizzy, tiredness (fatigue), or off-balance. Take over-the-counter and prescription medicines only as told by your health care provider. These include  supplements. Eat a healthy diet and maintain a healthy weight. A healthy diet includes low-fat dairy products, low-fat (lean) meats, and fiber from whole grains, beans, and lots of fruits and vegetables. Stay current with your vaccines. Schedule regular health, dental, and eye exams. Summary Having a healthy lifestyle and getting preventive care can help to protect your health and wellness after age 76. Screening and testing are the best way to find a health problem early and help you avoid having a fall. Early diagnosis and treatment give you the best chance for managing medical conditions that are more common for people who are older than age 76. Falls are a major cause of broken bones and head injuries in people who are older than age 76. Take precautions to prevent a fall at home. Work with your health care provider to learn what changes you can make to improve your health and wellness and to prevent falls. This information is not intended to replace advice given to you by your health care provider. Make sure you discuss any questions you have with your health care provider. Document Revised: 08/13/2020 Document Reviewed: 08/13/2020 Elsevier Patient Education  2023 Elsevier Inc.  

## 2022-03-20 NOTE — Patient Instructions (Signed)
Medication Instructions:  The current medical regimen is effective;  continue present plan and medications.  *If you need a refill on your cardiac medications before your next appointment, please call your pharmacy*  Follow-Up: At [facility], you and your health needs are our priority.  As part of our continuing mission to provide you with exceptional heart care, we have created designated Provider Care Teams.  These Care Teams include your primary Cardiologist (physician) and Advanced Practice Providers (APPs -  Physician Assistants and Nurse Practitioners) who all work together to provide you with the care you need, when you need it.  We recommend signing up for the patient portal called "MyChart".  Sign up information is provided on this After Visit Summary.  MyChart is used to connect with patients for Virtual Visits (Telemedicine).  Patients are able to view lab/test results, encounter notes, upcoming appointments, etc.  Non-urgent messages can be sent to your provider as well.   To learn more about what you can do with MyChart, go to [email or web address].    Your next appointment:   12 month(s)  The format for your next appointment:   In Person  Provider:    T O'Neal, MD         

## 2022-03-20 NOTE — Progress Notes (Signed)
Subjective:    Patient ID: John [name], male    DOB: 1945/06/15, 76 y.o.   MRN: [id number]  Chief Complaint  Patient presents with   Follow-up   Pt presents to the office today for  chronic care follow up. He is followed by Cardiologists  for CHF. Followed by Oncologists every 6 months for non-small lung cancer. He completed radiation.     He is followed by Pulmonologist every 6 months for COPD. He continues to smoke 1/2 and complaining of increased SOB.  He is followed by Vascular for PAD.    He is followed by Urologists for BPH and Prostate.   He has seen GI for bleed, but stable now.     Has OSA and using CPAP nightly.    Hypertension This is a chronic problem. The current episode started more than 1 year ago. The problem has been resolved since onset. The problem is controlled. Associated symptoms include malaise/fatigue and shortness of breath. Pertinent negatives include no blurred vision or peripheral edema. Risk factors for coronary artery disease include dyslipidemia, diabetes mellitus, obesity, male gender and smoking/tobacco exposure. The current treatment provides moderate improvement. Identifiable causes of hypertension include a thyroid problem.  Congestive Heart Failure Presents for follow-up visit. Associated symptoms include edema, fatigue, nocturia (maybe 1, doing much better) and shortness of breath. The symptoms have been stable.  Gastroesophageal Reflux He complains of belching and heartburn. He reports no hoarse voice or no nausea. This is a chronic problem. The current episode started more than 1 year ago. The problem occurs occasionally. Associated symptoms include fatigue. He has tried a PPI for the symptoms. The treatment provided moderate relief.  Hyperlipidemia This is a chronic problem. The current episode started more than 1 year ago. The problem is controlled. Exacerbating diseases include obesity. Associated symptoms include shortness of breath. Current  antihyperlipidemic treatment includes statins. The current treatment provides moderate improvement of lipids. Risk factors for coronary artery disease include dyslipidemia, diabetes mellitus, male sex, hypertension and a sedentary lifestyle.  Thyroid Problem Presents for follow-up visit. Symptoms include constipation and fatigue. Patient reports no hoarse voice. The symptoms have been stable. His past medical history is significant for hyperlipidemia.  Diabetes He presents for his follow-up diabetic visit. He has type 2 diabetes mellitus. Associated symptoms include fatigue and foot paresthesias. Pertinent negatives for diabetes include no blurred vision. Symptoms are stable. Diabetic complications include peripheral neuropathy. Risk factors for coronary artery disease include dyslipidemia, diabetes mellitus, male sex, hypertension and sedentary lifestyle. He is following a generally healthy diet. His overall blood glucose range is 130-140 mg/dl.  Arthritis Presents for follow-up visit. He complains of pain and stiffness. The symptoms have been stable. Affected locations include the left knee, right knee, left MCP, right MCP, left hip and right hip. His pain is at a severity of 6/10. Associated symptoms include fatigue.  Nicotine Dependence Presents for follow-up visit. Symptoms include fatigue and insomnia. His urge triggers include company of smokers. The symptoms have been stable. He smokes < 1/2 a pack of cigarettes per day.  Benign Prostatic Hypertrophy This is a chronic problem. The current episode started more than 1 year ago. The problem has been gradually worsening since onset. Irritative symptoms include nocturia (maybe 1, doing much better). Obstructive symptoms do not include an intermittent stream or straining. Pertinent negatives include no hematuria, hesitancy, nausea or vomiting.  Insomnia Primary symptoms: difficulty falling asleep, frequent awakening, malaise/fatigue.   The current  episode started more  than one year. The onset quality is gradual. The problem occurs intermittently. Past treatments include medication. The treatment provided moderate relief.  Anemia Presents for follow-up visit. Symptoms include malaise/fatigue.  Constipation This is a chronic problem. The current episode started more than 1 year ago. His stool frequency is 1 time per day. Pertinent negatives include no nausea or vomiting. He has tried laxatives for the symptoms. The treatment provided moderate relief.      Review of Systems  Constitutional:  Positive for fatigue and malaise/fatigue.  HENT:  Negative for hoarse voice.   Eyes:  Negative for blurred vision.  Respiratory:  Positive for shortness of breath.   Gastrointestinal:  Positive for constipation and heartburn. Negative for nausea and vomiting.  Genitourinary:  Positive for nocturia (maybe 1, doing much better). Negative for hematuria and hesitancy.  Musculoskeletal:  Positive for arthritis and stiffness.  Psychiatric/Behavioral:  The patient has insomnia.   All other systems reviewed and are negative.      Objective:   Physical Exam Vitals reviewed.  Constitutional:      General: He is not in acute distress.    Appearance: He is well-developed. He is obese.  HENT:     Head: Normocephalic.     Right Ear: Tympanic membrane normal.     Left Ear: Tympanic membrane normal.  Eyes:     General:        Right eye: No discharge.        Left eye: No discharge.     Pupils: Pupils are equal, round, and reactive to light.  Neck:     Thyroid: No thyromegaly.  Cardiovascular:     Rate and Rhythm: Normal rate and regular rhythm.     Heart sounds: Normal heart sounds. No murmur heard. Pulmonary:     Effort: Pulmonary effort is normal. No respiratory distress.     Breath sounds: Rhonchi present. No wheezing.  Abdominal:     General: Bowel sounds are normal. There is no distension.     Palpations: Abdomen is soft.     Tenderness:  There is no abdominal tenderness.  Musculoskeletal:        General: No tenderness. Normal range of motion.     Cervical back: Normal range of motion and neck supple.  Skin:    General: Skin is warm and dry.     Findings: No erythema or rash.  Neurological:     Mental Status: He is alert and oriented to person, place, and time.     Cranial Nerves: No cranial nerve deficit.     Deep Tendon Reflexes: Reflexes are normal and symmetric.  Psychiatric:        Behavior: Behavior normal.        Thought Content: Thought content normal.        Judgment: Judgment normal.       BP 129/80   Pulse 99   Temp 97.9 F (36.6 C) (Temporal)   Ht _0  (1.676 m)   Wt 148 lb (67.1 kg)   SpO2 99%   BMI 23.89 kg/m      Assessment & Plan:  John [name] comes in today with chief complaint of Follow-up   Diagnosis and orders addressed:  1. Primary osteoarthritis involving multiple joints  - CMP14+EGFR - CBC with Differential/Platelet  2. Primary insomnia - zolpidem (AMBIEN) 5 MG tablet; Take 1 tablet (5 mg total) by mouth at bedtime as needed for sleep.  Dispense: 30 tablet; Refill: 2 - CMP14+EGFR -  CBC with Differential/Platelet  3. Tobacco abuse - CMP14+EGFR - CBC with Differential/Platelet  4. Paroxysmal atrial flutter (HCC) - CMP14+EGFR - CBC with Differential/Platelet  5. PAD (peripheral artery disease) (HCC) - CMP14+EGFR - CBC with Differential/Platelet  6. Obstructive sleep apnea treated with continuous positive airway pressure (CPAP) - CMP14+EGFR - CBC with Differential/Platelet  7. Nicotine dependence, cigarettes, uncomplicated - YOK59+XHFS - CBC with Differential/Platelet  8. Mixed diabetic hyperlipidemia associated with type 2 diabetes mellitus (HCC) - CMP14+EGFR - CBC with Differential/Platelet  9. Iron deficiency anemia, unspecified iron deficiency anemia type  - CMP14+EGFR - CBC with Differential/Platelet  10. Hypothyroidism, unspecified type -  CMP14+EGFR - CBC with Differential/Platelet  11. Hypertension associated with diabetes ([place]) - CMP14+EGFR - CBC with Differential/Platelet  12. GERD without esophagitis  - CMP14+EGFR - CBC with Differential/Platelet  13. Type 2 diabetes mellitus with diabetic polyneuropathy, without long-term current use of insulin (HCC) - CMP14+EGFR - CBC with Differential/Platelet  14. COPD GOLD III/ group D still smoking   - CMP14+EGFR - CBC with Differential/Platelet  15. Acute on chronic diastolic CHF (congestive heart failure) ([place])  - CMP14+EGFR - CBC with Differential/Platelet   Labs pending Patient reviewed in [place] controlled database, no flags noted.  Health Maintenance reviewed Diet and exercise encouraged  Follow up plan: 3 months    Evelina Dun, FNP

## 2022-03-21 LAB — CBC WITH DIFFERENTIAL/PLATELET
Basophils Absolute: 0.1 10*3/uL (ref 0.0–0.2)
Basos: 1 %
EOS (ABSOLUTE): 0.2 10*3/uL (ref 0.0–0.4)
Eos: 2 %
Hematocrit: 39.4 % (ref 37.5–51.0)
Hemoglobin: 13.6 g/dL (ref 13.0–17.7)
Immature Grans (Abs): 0 10*3/uL (ref 0.0–0.1)
Immature Granulocytes: 0 %
Lymphocytes Absolute: 0.7 10*3/uL (ref 0.7–3.1)
Lymphs: 8 %
MCH: 33.2 pg — ABNORMAL HIGH (ref 26.6–33.0)
MCHC: 34.5 g/dL (ref 31.5–35.7)
MCV: 96 fL (ref 79–97)
Monocytes Absolute: 0.9 10*3/uL (ref 0.1–0.9)
Monocytes: 9 %
Neutrophils Absolute: 7.3 10*3/uL — ABNORMAL HIGH (ref 1.4–7.0)
Neutrophils: 80 %
Platelets: 413 10*3/uL (ref 150–450)
RBC: 4.1 x10E6/uL — ABNORMAL LOW (ref 4.14–5.80)
RDW: 13.9 % (ref 11.6–15.4)
WBC: 9.2 10*3/uL (ref 3.4–10.8)

## 2022-03-21 LAB — CMP14+EGFR
ALT: 15 IU/L (ref 0–44)
AST: 16 IU/L (ref 0–40)
Albumin/Globulin Ratio: 1.2 (ref 1.2–2.2)
Albumin: 3.7 g/dL — ABNORMAL LOW (ref 3.8–4.8)
Alkaline Phosphatase: 144 IU/L — ABNORMAL HIGH (ref 44–121)
BUN/Creatinine Ratio: 18 (ref 10–24)
BUN: 20 mg/dL (ref 8–27)
Bilirubin Total: <0.2 mg/dL (ref 0.0–1.2)
CO2: 27 mmol/L (ref 20–29)
Calcium: 9.6 mg/dL (ref 8.6–10.2)
Chloride: 97 mmol/L (ref 96–106)
Creatinine, Ser: 1.12 mg/dL (ref 0.76–1.27)
Globulin, Total: 3 g/dL (ref 1.5–4.5)
Glucose: 136 mg/dL — ABNORMAL HIGH (ref 70–99)
Potassium: 5.2 mmol/L (ref 3.5–5.2)
Sodium: 139 mmol/L (ref 134–144)
Total Protein: 6.7 g/dL (ref 6.0–8.5)
eGFR: 68 mL/min/{1.73_m2} (ref >59)

## 2022-03-23 ENCOUNTER — Other Ambulatory Visit: Payer: Self-pay | Admitting: Family

## 2022-03-23 DIAGNOSIS — F5101 Primary insomnia: Secondary | ICD-10-CM

## 2022-03-24 ENCOUNTER — Telehealth: Payer: Self-pay

## 2022-03-24 ENCOUNTER — Other Ambulatory Visit (HOSPITAL_COMMUNITY): Payer: Self-pay

## 2022-03-24 NOTE — Telephone Encounter (Signed)
Patient Advocate Encounter   Received notification from [employer] that prior authorization for Zolpidem 5MG  is required.   PA submitted on 03/24/2022 [facility]  Status is pending       Joneen Boers, [place] Patient Advocate Specialist [place] Patient Advocate Team Direct Number: [phone] Fax: [phone]

## 2022-03-25 ENCOUNTER — Other Ambulatory Visit: Payer: Self-pay | Admitting: Family

## 2022-03-25 NOTE — Telephone Encounter (Signed)
Pharmacy Patient Advocate Encounter  Received notification from [facility] that the request for prior authorization for Zolpidem 5mg  has been denied due to  .

## 2022-03-28 ENCOUNTER — Ambulatory Visit: Payer: Medicare Other | Admitting: Family

## 2022-03-28 DIAGNOSIS — Z23 Encounter for immunization: Secondary | ICD-10-CM | POA: Diagnosis not present

## 2022-04-03 ENCOUNTER — Other Ambulatory Visit: Payer: Self-pay | Admitting: Family

## 2022-04-08 ENCOUNTER — Encounter: Payer: Self-pay | Admitting: Family

## 2022-04-08 ENCOUNTER — Telehealth (INDEPENDENT_AMBULATORY_CARE_PROVIDER_SITE_OTHER): Payer: Medicare Other | Admitting: Family

## 2022-04-08 DIAGNOSIS — M10041 Idiopathic gout, right hand: Secondary | ICD-10-CM | POA: Diagnosis not present

## 2022-04-08 DIAGNOSIS — M109 Gout, unspecified: Secondary | ICD-10-CM

## 2022-04-08 MED ORDER — TRAMADOL HCL 50 MG PO TABS: 50.0000 mg | ORAL_TABLET | Freq: Four times a day (QID) | ORAL | 0 refills | Status: AC | PRN

## 2022-04-08 MED ORDER — COLCHICINE 0.6 MG PO TABS: ORAL_TABLET | ORAL | 0 refills | Status: DC

## 2022-04-08 MED ORDER — PREDNISONE 20 MG PO TABS: 40.0000 mg | ORAL_TABLET | Freq: Every day | ORAL | 0 refills | Status: AC

## 2022-04-08 NOTE — Progress Notes (Signed)
Virtual Visit Consent   John [name], you are scheduled for a virtual visit with a [place] provider today. Just as with appointments in the office, your consent must be obtained to participate. Your consent will be active for this visit and any virtual visit you may have with one of our providers in the next 365 days. If you have a MyChart account, a copy of this consent can be sent to you electronically.  As this is a virtual visit, video technology does not allow for your provider to perform a traditional examination. This may limit your provider's ability to fully assess your condition. If your provider identifies any concerns that need to be evaluated in person or the need to arrange testing (such as labs, EKG, etc.), we will make arrangements to do so. Although advances in technology are sophisticated, we cannot ensure that it will always work on either your end or our end. If the connection with a video visit is poor, the visit may have to be switched to a telephone visit. With either a video or telephone visit, we are not always able to ensure that we have a secure connection.  By engaging in this virtual visit, you consent to the provision of healthcare and authorize for your insurance to be billed (if applicable) for the services provided during this visit. Depending on your insurance coverage, you may receive a charge related to this service.  I need to obtain your verbal consent now. Are you willing to proceed with your visit today? John [name] has provided verbal consent on 04/08/2022 for a virtual visit (video or telephone). Evelina Dun, FNP  Date: 04/08/2022 10:24 AM  Virtual Visit via Video Note   I, Evelina Dun, connected with  John [name]  ([id number], Mar 20, 1946) on 04/08/22 at 10:55 AM EST by a video-enabled telemedicine application and verified that I am speaking with the correct person using two identifiers.  Location: Patient: Virtual Visit Location Patient:  Home Provider: Virtual Visit Location Provider: Office/Clinic   I discussed the limitations of evaluation and management by telemedicine and the availability of in person appointments. The patient expressed understanding and agreed to proceed.    History of Present Illness: John [name] is a 77 y.o. who identifies as a male who was assigned male at birth, and is being seen today for gout flare up two days ago in right hand. Reports her pain is 8 out 10. Has hx of gout. Reports moderate swelling of hand, redness, and heat. Reports pain with anything touches it.   HPI: HPI  Problems:  Patient Active Problem List   Diagnosis Date Noted   Lethargy 10/10/2021   Heme positive stool    Rash 10/09/2021   GIB (gastrointestinal bleeding) 09/14/2021   Acute on chronic heart failure with preserved ejection fraction (HFpEF) ([place]) 09/13/2021   Paroxysmal atrial flutter ([place]) 08/25/2021   GERD without esophagitis 08/25/2021   Chronic respiratory failure with hypoxia ([place]) 08/25/2021   Carotid stenosis, right 08/25/2021   Iron deficiency anemia    Positive colorectal cancer screening using Cologuard test    Angiodysplasia of cecum    Elevated troponin level not due myocardial infarction    History of atrial fibrillation 10/09/2020   Constipation 09/07/2020   Acute gastric ulcer without hemorrhage or perforation    GI bleed 09/05/2020   Atrial flutter ([place]) 09/05/2020   Acute on chronic anemia 09/05/2020   Hypokalemia 05/06/2020   Hypoalbuminemia 05/06/2020   Lactic acidosis 05/06/2020  Tobacco abuse 05/06/2020   Urinary retention 03/09/2020   Hypoxia    COPD GOLD III/ group D still smoking  10/24/2019   PAD (peripheral artery disease) ([place]) 09/13/2019   Obstructive sleep apnea treated with continuous positive airway pressure (CPAP) 05/31/2018   Diabetic neuropathy ([place]) 12/29/2017   Nicotine dependence, cigarettes, uncomplicated 78/24/2353   Mixed diabetic hyperlipidemia associated  with type 2 diabetes mellitus ([place]) 12/29/2017   Primary insomnia 12/29/2017   Primary osteoarthritis involving multiple joints 12/29/2017   Hypothyroidism 06/09/2017   Joint pain 08/05/2016   Hyponatremia 08/05/2016   Pulmonary nodule, left 07/16/2016   Acute on chronic diastolic CHF (congestive heart failure) ([place]) 07/09/2016   Diabetes mellitus ([place]) 07/09/2016   Non-small cell lung cancer, right ([place]) 04/18/2016   Hypertension associated with diabetes ([place]) 02/01/2016   COPD (chronic obstructive pulmonary disease) ([place]) 02/01/2016   BPH with urinary obstruction 02/01/2016    Allergies:  Allergies  Allergen Reactions   Sulfa Antibiotics Swelling    Mouth swelling   Atorvastatin Other (See Comments)    Muscle aches - tolerating Pravastatin 40 mg MWF   Jardiance [Empagliflozin] Other (See Comments)    FEELS SLUGGISH, TIRED   Lopressor [Metoprolol] Other (See Comments)    Fatigue   Rosuvastatin Other (See Comments)    Muscle aches - tolerating Pravastatin 40 mg MWF   Doxycycline Nausea Only   Temazepam Other (See Comments)    Made insomnia worse    Medications:  Current Outpatient Medications:    colchicine 0.6 MG tablet, Take 2 tabs immediately, then 1 tab twice per day for the duration of the flare up to a max of 7 days, Disp: 21 tablet, Rfl: 0   predniSONE (DELTASONE) 20 MG tablet, Take 2 tablets (40 mg total) by mouth daily with breakfast for 5 days., Disp: 10 tablet, Rfl: 0   traMADol (ULTRAM) 50 MG tablet, Take 1 tablet (50 mg total) by mouth every 6 (six) hours as needed for up to 5 days., Disp: 15 tablet, Rfl: 0   apixaban (ELIQUIS) 5 MG TABS tablet, Take 1 tablet (5 mg total) by mouth 2 (two) times daily., Disp: 60 tablet, Rfl: 2   Blood Glucose Monitoring Suppl (ONETOUCH VERIO REFLECT) w/Device KIT, Use to test blood sugars daily as directed. DX: E11.9, Disp: 1 kit, Rfl: 1   [employer] IRON 325 (65 Fe) MG tablet, TAKE 1 TABLET (325MG) TWICE DAILY, Disp: 200 tablet, Rfl: 1    dutasteride (AVODART) 0.5 MG capsule, Take 1 capsule (0.5 mg total) by mouth daily., Disp: 90 capsule, Rfl: 3   ezetimibe (ZETIA) 10 MG tablet, Take 10 mg by mouth daily., Disp: , Rfl:    famotidine (PEPCID) 20 MG tablet, One after supper (Patient taking differently: Take 20 mg by mouth daily with supper.), Disp: 30 tablet, Rfl: 11   fluticasone (FLONASE) 50 MCG/ACT nasal spray, PLACE 1 SPRAY INTO BOTH NOSTRILS DAILY AS NEEDED FOR ALLERGIES OR RHINITIS., Disp: 48 mL, Rfl: 3   Fluticasone-Umeclidin-Vilant (TRELEGY ELLIPTA) 100-62.5-25 MCG/ACT AEPB, Inhale 1 puff into the lungs daily., Disp: 1 each, Rfl: 11   gabapentin (NEURONTIN) 300 MG capsule, TAKE 1 CAPSULE BY MOUTH THREE TIMES A DAY, Disp: 270 capsule, Rfl: 0   KLOR-CON M20 20 MEQ tablet, TAKE 1 TABLET BY MOUTH EVERY DAY, Disp: 90 tablet, Rfl: 0   levothyroxine (SYNTHROID) 50 MCG tablet, TAKE 1 TABLET BY MOUTH EVERY DAY, Disp: 90 tablet, Rfl: 1   linaclotide (LINZESS) 72 MCG capsule, Take 1 capsule (72  mcg total) by mouth daily as needed (constipation)., Disp: , Rfl:    metolazone (ZAROXOLYN) 2.5 MG tablet, Take 2.5 mg by mouth every Monday, Wednesday, and Friday., Disp: , Rfl:    NON FORMULARY, CPAP at bedtime, Disp: , Rfl:    ondansetron (ZOFRAN) 4 MG tablet, TAKE 1 TABLET BY MOUTH EVERY 8 HOURS AS NEEDED FOR NAUSEA AND VOMITING, Disp: 60 tablet, Rfl: 2   OneTouch Delica Lancets 19J MISC, Use to test blood sugars daily as directed. DX: E11.9, Disp: 100 each, Rfl: 1   ONETOUCH VERIO test strip, USE TO TEST BLOOD SUGARS DAILY AS DIRECTED. DX: E11.9, Disp: 100 strip, Rfl: 3   OXYGEN, Inhale 2 L into the lungs daily as needed (with Cpap at night)., Disp: , Rfl:    pantoprazole (PROTONIX) 40 MG tablet, Take 1 tablet (40 mg total) by mouth 2 (two) times daily before a meal., Disp: 60 tablet, Rfl: 5   pravastatin (PRAVACHOL) 40 MG tablet, TAKE 1 TABLET (40 MG TOTAL) BY MOUTH ON MONDAYS , WEDNESDAYS, AND FRIDAYS AS DIRECTED, Disp: 48 tablet, Rfl: 0    Semaglutide (RYBELSUS) 14 MG TABS, Take 1 tablet (14 mg total) by mouth daily., Disp: 30 tablet, Rfl: 3   spironolactone (ALDACTONE) 25 MG tablet, Take 1 tablet (25 mg total) by mouth daily., Disp: 90 tablet, Rfl: 3   torsemide (DEMADEX) 20 MG tablet, Take 1 tablet (20 mg total) by mouth daily., Disp: 90 tablet, Rfl: 2   zolpidem (AMBIEN) 5 MG tablet, Take 1 tablet (5 mg total) by mouth at bedtime as needed for sleep., Disp: 30 tablet, Rfl: 2  Observations/Objective: Patient is well-developed, well-nourished in no acute distress.  Resting comfortably  at home.  Head is normocephalic, atraumatic.  No labored breathing.  Speech is clear and coherent with logical content.  Patient is alert and oriented at baseline.  Right hand swollen, red, and tender. Fingers swollen.  Assessment and Plan: 1. Acute gout of right hand, unspecified cause - predniSONE (DELTASONE) 20 MG tablet; Take 2 tablets (40 mg total) by mouth daily with breakfast for 5 days.  Dispense: 10 tablet; Refill: 0 - colchicine 0.6 MG tablet; Take 2 tabs immediately, then 1 tab twice per day for the duration of the flare up to a max of 7 days  Dispense: 21 tablet; Refill: 0 - traMADol (ULTRAM) 50 MG tablet; Take 1 tablet (50 mg total) by mouth every 6 (six) hours as needed for up to 5 days.  Dispense: 15 tablet; Refill: 0  Will give colchicine and prednisone  Force fluids Once attack is over will discuss allopurinol, he is on several fluid pills.  Low purine diet Keep chronic follow up   Follow Up Instructions: I discussed the assessment and treatment plan with the patient. The patient was provided an opportunity to ask questions and all were answered. The patient agreed with the plan and demonstrated an understanding of the instructions.  A copy of instructions were sent to the patient via MyChart unless otherwise noted below.     The patient was advised to call back or seek an in-person evaluation if the symptoms worsen or if  the condition fails to improve as anticipated.  Time:  I spent 12 minutes with the patient via telehealth technology discussing the above problems/concerns.    Evelina Dun, FNP

## 2022-04-13 ENCOUNTER — Other Ambulatory Visit: Payer: Self-pay | Admitting: Family

## 2022-04-13 DIAGNOSIS — E1169 Type 2 diabetes mellitus with other specified complication: Secondary | ICD-10-CM

## 2022-04-16 ENCOUNTER — Other Ambulatory Visit (HOSPITAL_BASED_OUTPATIENT_CLINIC_OR_DEPARTMENT_OTHER): Payer: Self-pay | Admitting: Cardiovascular Disease

## 2022-04-25 ENCOUNTER — Inpatient Hospital Stay: Payer: Medicare Other | Attending: Hematology

## 2022-04-25 ENCOUNTER — Ambulatory Visit (HOSPITAL_COMMUNITY)
Admission: RE | Admit: 2022-04-25 | Discharge: 2022-04-25 | Disposition: A | Discharge status: Home / Self Care | Payer: Medicare Other | Source: Ambulatory Visit | Attending: Hematology | Admitting: Hematology

## 2022-04-25 DIAGNOSIS — J439 Emphysema, unspecified: Secondary | ICD-10-CM | POA: Diagnosis not present

## 2022-04-25 DIAGNOSIS — C3491 Malignant neoplasm of unspecified part of right bronchus or lung: Secondary | ICD-10-CM | POA: Insufficient documentation

## 2022-04-25 DIAGNOSIS — D509 Iron deficiency anemia, unspecified: Secondary | ICD-10-CM | POA: Insufficient documentation

## 2022-04-25 DIAGNOSIS — Z85118 Personal history of other malignant neoplasm of bronchus and lung: Secondary | ICD-10-CM | POA: Diagnosis not present

## 2022-04-25 DIAGNOSIS — C3490 Malignant neoplasm of unspecified part of unspecified bronchus or lung: Secondary | ICD-10-CM | POA: Diagnosis not present

## 2022-04-25 DIAGNOSIS — Z08 Encounter for follow-up examination after completed treatment for malignant neoplasm: Secondary | ICD-10-CM | POA: Diagnosis not present

## 2022-04-25 DIAGNOSIS — R918 Other nonspecific abnormal finding of lung field: Secondary | ICD-10-CM | POA: Diagnosis not present

## 2022-04-25 DIAGNOSIS — E039 Hypothyroidism, unspecified: Secondary | ICD-10-CM | POA: Diagnosis not present

## 2022-04-25 DIAGNOSIS — D649 Anemia, unspecified: Secondary | ICD-10-CM | POA: Insufficient documentation

## 2022-04-25 LAB — COMPREHENSIVE METABOLIC PANEL WITH GFR
ALT: 22 U/L (ref 0–44)
AST: 19 U/L (ref 15–41)
Albumin: 3.5 g/dL (ref 3.5–5.0)
Alkaline Phosphatase: 111 U/L (ref 38–126)
Anion gap: 11 (ref 5–15)
BUN: 21 mg/dL (ref 8–23)
CO2: 28 mmol/L (ref 22–32)
Calcium: 9.1 mg/dL (ref 8.9–10.3)
Chloride: 94 mmol/L — ABNORMAL LOW (ref 98–111)
Creatinine, Ser: 0.99 mg/dL (ref 0.61–1.24)
GFR, Estimated: >60 mL/min
Glucose, Bld: 202 mg/dL — ABNORMAL HIGH (ref 70–99)
Potassium: 4.4 mmol/L (ref 3.5–5.1)
Sodium: 133 mmol/L — ABNORMAL LOW (ref 135–145)
Total Bilirubin: 0.5 mg/dL (ref 0.3–1.2)
Total Protein: 7 g/dL (ref 6.5–8.1)

## 2022-04-25 LAB — CBC WITH DIFFERENTIAL/PLATELET
Abs Immature Granulocytes: 0.08 10*3/uL — ABNORMAL HIGH (ref 0.00–0.07)
Basophils Absolute: 0.1 10*3/uL (ref 0.0–0.1)
Basophils Relative: 1 %
Eosinophils Absolute: 0.1 10*3/uL (ref 0.0–0.5)
Eosinophils Relative: 1 %
HCT: 38.1 % — ABNORMAL LOW (ref 39.0–52.0)
Hemoglobin: 12.6 g/dL — ABNORMAL LOW (ref 13.0–17.0)
Immature Granulocytes: 1 %
Lymphocytes Relative: 6 %
Lymphs Abs: 0.7 10*3/uL (ref 0.7–4.0)
MCH: 33.8 pg (ref 26.0–34.0)
MCHC: 33.1 g/dL (ref 30.0–36.0)
MCV: 102.1 fL — ABNORMAL HIGH (ref 80.0–100.0)
Monocytes Absolute: 1.2 10*3/uL — ABNORMAL HIGH (ref 0.1–1.0)
Monocytes Relative: 10 %
Neutro Abs: 8.9 10*3/uL — ABNORMAL HIGH (ref 1.7–7.7)
Neutrophils Relative %: 81 %
Platelets: 297 10*3/uL (ref 150–400)
RBC: 3.73 MIL/uL — ABNORMAL LOW (ref 4.22–5.81)
RDW: 15.5 % (ref 11.5–15.5)
WBC: 11 10*3/uL — ABNORMAL HIGH (ref 4.0–10.5)
nRBC: 0 % (ref 0.0–0.2)

## 2022-04-25 LAB — IRON AND TIBC
Iron: 47 ug/dL (ref 45–182)
Saturation Ratios: 11 % — ABNORMAL LOW (ref 17.9–39.5)
TIBC: 440 ug/dL (ref 250–450)
UIBC: 393 ug/dL

## 2022-04-25 LAB — FERRITIN: Ferritin: 40 ng/mL (ref 24–336)

## 2022-04-25 MED ORDER — HEPARIN SOD (PORK) LOCK FLUSH 100 UNIT/ML IV SOLN
INTRAVENOUS | Status: AC
  Administered 2022-04-25: 500 [IU] via INTRAMUSCULAR
  Filled 2022-04-25: qty 5

## 2022-04-25 MED ORDER — IOHEXOL 300 MG/ML  SOLN
75.0000 mL | Freq: Once | INTRAMUSCULAR | Status: AC | PRN
  Administered 2022-04-25: 75 mL via INTRAVENOUS

## 2022-04-25 NOTE — Progress Notes (Signed)
John [name] presented for Portacath access and lab work.   Portacath located left chest wall accessed with  H 20 needle Power Port needle for CT scan.  Good blood return present. Portacath flushed with 30ml NS ans labs drawn for CT.  Procedure tolerated well and without incident. No complaints at this time. Discharged from clinic by wheel chair accompanied by his wife in stable condition. Alert and oriented x 3. F/U with [facility] as scheduled.

## 2022-05-02 ENCOUNTER — Encounter: Payer: Self-pay | Admitting: Cardiology

## 2022-05-02 ENCOUNTER — Ambulatory Visit: Payer: Medicare Other | Attending: Cardiology | Admitting: Cardiology

## 2022-05-02 VITALS — BP 130/64 | HR 100 | Ht 66.0 in | Wt 151.0 lb

## 2022-05-02 DIAGNOSIS — I10 Essential (primary) hypertension: Secondary | ICD-10-CM | POA: Diagnosis not present

## 2022-05-02 DIAGNOSIS — D649 Anemia, unspecified: Secondary | ICD-10-CM | POA: Diagnosis not present

## 2022-05-02 DIAGNOSIS — I483 Typical atrial flutter: Secondary | ICD-10-CM | POA: Diagnosis not present

## 2022-05-02 DIAGNOSIS — I5032 Chronic diastolic (congestive) heart failure: Secondary | ICD-10-CM | POA: Insufficient documentation

## 2022-05-02 MED ORDER — ASPIRIN 81 MG PO TBEC: 81.0000 mg | DELAYED_RELEASE_TABLET | Freq: Every day | ORAL | 3 refills | Status: DC

## 2022-05-02 MED ORDER — METOPROLOL SUCCINATE ER 25 MG PO TB24: 12.5000 mg | ORAL_TABLET | Freq: Every day | ORAL | 3 refills | Status: DC

## 2022-05-02 NOTE — Progress Notes (Signed)
Electrophysiology Office Follow up Visit Note:    Date:  05/02/2022   ID:  John [name], DOB [birth year], MRN [id number]  PCP:  [name], [place] Cardiologist:  Evalina Field, MD  [facility] Electrophysiologist:  Vickie Epley, MD    Interval History:    John [name] is a 77 y.o. male who presents for a follow up visit. They were last seen in clinic February 18, 2022.  He had a flutter ablation on January 13, 2022.  He has done well after the ablation without recurrence of arrhythmia.  No evidence of atrial fibrillation after his flutter ablation.  We discussed using a loop recorder for atrial fibrillation surveillance moving forward and he is interested in proceeding.  This will allow him to discontinue his anticoagulation which is favorable given his history of anemia.  Today, he appears well overall. However he has been out of his diabetic medication for 4 days. His blood sugar is running about 160 at home.  He reports that metoprolol was previously discontinued due to dropping his heart rates too low.  He denies any palpitations, chest pain, shortness of breath, or peripheral edema. No lightheadedness, headaches, syncope, orthopnea, or PND.      Past Medical History:  Diagnosis Date   Adenocarcinoma of lung, right ([place]) 04/18/2016   Anemia    Anxiety    Arthritis    Asthma    Atrial flutter (HCC)    BPH (benign prostatic hyperplasia)    with urinary retention 02/06/20   CHF (congestive heart failure) (HCC)    COPD (chronic obstructive pulmonary disease) ([place])    Depression    Diabetes mellitus, type II ([place])    Dyspnea    History of kidney stones    History of radiation therapy    right lung 09/10/2020-09/17/2020   Dr Sondra Come   Hyperlipidemia    Hypertension    Hypothyroidism    Macular degeneration    Neuropathy    Non-small cell lung cancer, right ([place]) 04/18/2016   Peripheral vascular disease ([place])    Prostatitis    Pulmonary  nodule, left 07/16/2016   Sleep apnea    cpap    Past Surgical History:  Procedure Laterality Date   A-FLUTTER ABLATION N/A 01/13/2022   Procedure: A-FLUTTER ABLATION;  Surgeon: Vickie Epley, MD;  Location: [place] [facility];  Service: Cardiovascular;  Laterality: N/A;   ABDOMINAL AORTOGRAM W/LOWER EXTREMITY Left 02/06/2020   Procedure: ABDOMINAL AORTOGRAM W/LOWER EXTREMITY;  Surgeon: Lorretta Harp, MD;  Location: [place] [facility];  Service: Cardiovascular;  Laterality: Left;   ABDOMINAL AORTOGRAM W/LOWER EXTREMITY N/A 05/30/2021   Procedure: ABDOMINAL AORTOGRAM W/LOWER EXTREMITY;  Surgeon: Marty Heck, MD;  Location: [place] [facility];  Service: Cardiovascular;  Laterality: N/A;   BIOPSY  09/16/2021   Procedure: BIOPSY;  Surgeon: Gatha Mayer, MD;  Location: [place];  Service: Gastroenterology;;   CARDIOVERSION N/A 10/05/2020   Procedure: CARDIOVERSION;  Surgeon: Geralynn Rile, MD;  Location: [place];  Service: Cardiovascular;  Laterality: N/A;   CATARACT EXTRACTION, BILATERAL Bilateral    COLONOSCOPY WITH PROPOFOL N/A 06/20/2021   Procedure: COLONOSCOPY WITH PROPOFOL;  Surgeon: Irene Shipper, MD;  [facility]: [facility];  Service: Endoscopy;  Laterality: N/A;   COLONOSCOPY WITH PROPOFOL N/A 09/16/2021   Procedure: COLONOSCOPY WITH PROPOFOL;  Surgeon: Gatha Mayer, MD;  Location: [place];  Service: Gastroenterology;  Laterality: N/A;   ENDARTERECTOMY FEMORAL Right 08/20/2020  Procedure: ENDARTERECTOMY  RIGHT FEMORAL ARTERY;  Surgeon: Marty Heck, MD;  Location: [address];  Service: Vascular;  Laterality: Right;   ENTEROSCOPY N/A 10/11/2021   Procedure: ENTEROSCOPY;  Surgeon: Harvel Quale, MD;  [facility];  Service: Gastroenterology;  Laterality: N/A;   ESOPHAGOGASTRODUODENOSCOPY N/A 09/16/2021   Procedure: ESOPHAGOGASTRODUODENOSCOPY (EGD);  Surgeon: Gatha Mayer, MD;  Location: [place];  Service:  Gastroenterology;  Laterality: N/A;   ESOPHAGOGASTRODUODENOSCOPY (EGD) WITH PROPOFOL N/A 09/06/2020   Procedure: ESOPHAGOGASTRODUODENOSCOPY (EGD) WITH PROPOFOL;  Surgeon: Irene Shipper, MD;  Location: [facility];  Service: Endoscopy;  Laterality: N/A;   ESOPHAGOGASTRODUODENOSCOPY (EGD) WITH PROPOFOL N/A 10/11/2021   Procedure: ESOPHAGOGASTRODUODENOSCOPY (EGD) WITH PROPOFOL;  Surgeon: Harvel Quale, MD;  [facility];  Service: Gastroenterology;  Laterality: N/A;   HEMOSTASIS CLIP PLACEMENT  09/16/2021   Procedure: HEMOSTASIS CLIP PLACEMENT;  Surgeon: Gatha Mayer, MD;  Location: [place];  Service: Gastroenterology;;   HOT HEMOSTASIS N/A 09/16/2021   Procedure: HOT HEMOSTASIS (ARGON PLASMA COAGULATION/BICAP);  Surgeon: Gatha Mayer, MD;  Location: [place];  Service: Gastroenterology;  Laterality: N/A;   HOT HEMOSTASIS  10/11/2021   Procedure: HOT HEMOSTASIS (ARGON PLASMA COAGULATION/BICAP);  Surgeon: Montez Morita, Quillian Quince, MD;  [facility];  Service: Gastroenterology;;   INSERTION OF ILIAC STENT Right 08/20/2020   Procedure: RETROGRADE INSERTION OF RIGHT ILIAC STENT;  Surgeon: Marty Heck, MD;  Location: [place];  Service: Vascular;  Laterality: Right;   INTRAOPERATIVE ARTERIOGRAM Right 08/20/2020   Procedure: INTRA OPERATIVE ARTERIOGRAM ILIAC;  Surgeon: Marty Heck, MD;  Location: [place];  Service: Vascular;  Laterality: Right;   PATCH ANGIOPLASTY Right 08/20/2020   Procedure: PATCH ANGIOPLASTY RIGHT FEMORAL ARTERY;  Surgeon: Marty Heck, MD;  Location: [place];  Service: Vascular;  Laterality: Right;   PERIPHERAL VASCULAR BALLOON ANGIOPLASTY Right 05/30/2021   Procedure: PERIPHERAL VASCULAR BALLOON ANGIOPLASTY;  Surgeon: Marty Heck, MD;  Location: [place] [facility];  Service: Cardiovascular;  Laterality: Right;   PORTACATH PLACEMENT Left 06/13/2016   Procedure: INSERTION PORT-A-CATH;  Surgeon: Aviva Signs, MD;   [facility];  Service: General;  Laterality: Left;   TRANSURETHRAL RESECTION OF PROSTATE N/A 05/31/2020   Procedure: TRANSURETHRAL RESECTION OF THE PROSTATE (TURP);  Surgeon: Cleon Gustin, MD;  [facility];  Service: Urology;  Laterality: N/A;   VIDEO BRONCHOSCOPY WITH ENDOBRONCHIAL NAVIGATION N/A 05/28/2016   Procedure: VIDEO BRONCHOSCOPY WITH ENDOBRONCHIAL NAVIGATION;  Surgeon: [name], MD;  Location: [place];  Service: Thoracic;  Laterality: N/A;   VIDEO BRONCHOSCOPY WITH ENDOBRONCHIAL ULTRASOUND N/A 05/28/2016   Procedure: VIDEO BRONCHOSCOPY WITH ENDOBRONCHIAL ULTRASOUND;  Surgeon: [name], MD;  [facility];  Service: Thoracic;  Laterality: N/A;    Current Medications: Current Meds  Medication Sig   apixaban (ELIQUIS) 5 MG TABS tablet Take 1 tablet (5 mg total) by mouth 2 (two) times daily.   Blood Glucose Monitoring Suppl (ONETOUCH VERIO REFLECT) w/Device KIT Use to test blood sugars daily as directed. DX: E11.9   colchicine 0.6 MG tablet Take 2 tabs immediately, then 1 tab twice per day for the duration of the flare up to a max of 7 days   [employer] IRON 325 (65 Fe) MG tablet TAKE 1 TABLET (325MG ) TWICE DAILY   dutasteride (AVODART) 0.5 MG capsule Take 1 capsule (0.5 mg total) by mouth daily.   ezetimibe (ZETIA) 10 MG tablet Take 10 mg by mouth daily.   famotidine (  PEPCID) 20 MG tablet One after supper (Patient taking differently: Take 20 mg by mouth daily with supper.)   fluticasone (FLONASE) 50 MCG/ACT nasal spray PLACE 1 SPRAY INTO BOTH NOSTRILS DAILY AS NEEDED FOR ALLERGIES OR RHINITIS.   Fluticasone-Umeclidin-Vilant (TRELEGY ELLIPTA) 100-62.5-25 MCG/ACT AEPB Inhale 1 puff into the lungs daily.   gabapentin (NEURONTIN) 300 MG capsule TAKE 1 CAPSULE BY MOUTH THREE TIMES A DAY   KLOR-CON M20 20 MEQ tablet TAKE 1 TABLET BY MOUTH EVERY DAY   levothyroxine (SYNTHROID) 50 MCG tablet TAKE 1 TABLET BY MOUTH EVERY DAY   linaclotide (LINZESS) 72 MCG capsule  Take 1 capsule (72 mcg total) by mouth daily as needed (constipation).   metolazone (ZAROXOLYN) 2.5 MG tablet TAKE 1 TABLET BY MOUTH ON MONDAY, WEDNESDAY AND FRIDAY   NON FORMULARY CPAP at bedtime   ondansetron (ZOFRAN) 4 MG tablet TAKE 1 TABLET BY MOUTH EVERY 8 HOURS AS NEEDED FOR NAUSEA AND VOMITING   OneTouch Delica Lancets 78H MISC Use to test blood sugars daily as directed. DX: E11.9   ONETOUCH VERIO test strip USE TO TEST BLOOD SUGARS DAILY AS DIRECTED. DX: E11.9   OXYGEN Inhale 2 L into the lungs daily as needed (with Cpap at night).   pravastatin (PRAVACHOL) 40 MG tablet TAKE 1 TABLET (40 MG TOTAL) BY MOUTH ON MONDAYS , WEDNESDAYS, AND FRIDAYS AS DIRECTED   Semaglutide (RYBELSUS) 14 MG TABS Take 1 tablet (14 mg total) by mouth daily.   spironolactone (ALDACTONE) 25 MG tablet Take 1 tablet (25 mg total) by mouth daily.   torsemide (DEMADEX) 20 MG tablet Take 1 tablet (20 mg total) by mouth daily.   zolpidem (AMBIEN) 5 MG tablet Take 1 tablet (5 mg total) by mouth at bedtime as needed for sleep.     Allergies:   Sulfa antibiotics, Atorvastatin, Jardiance [empagliflozin], Lopressor [metoprolol], Rosuvastatin, Doxycycline, and Temazepam   Social History   Socioeconomic History   Marital status: Soil scientist    Spouse name: Not on file   Number of children: 5   Years of education: 10   Highest education level: 10th grade  Occupational History   Occupation: Retired  Tobacco Use   Smoking status: Every Day    Packs/day: 0.50    Years: 55.00    Total pack years: 27.50    Types: Cigarettes    Start date: 03/11/1961   Smokeless tobacco: Never   Tobacco comments:    1/2 pack to 1 pack per day 12/14/19  Vaping Use   Vaping Use: Never used  Substance and Sexual Activity   Alcohol use: No   Drug use: No   Sexual activity: Yes    Birth control/protection: None  Other Topics Concern   Not on file  Social History Narrative   [name] is his POA and lives with him. Children out of  town - one in [facility], others in [place].   Social Determinants of Health   Financial Resource Strain: Low Risk  (11/15/2021)   Overall Financial Resource Strain (CARDIA)    Difficulty of Paying Living Expenses: Not hard at all  Food Insecurity: No Food Insecurity (11/15/2021)   Hunger Vital Sign    Worried About Running Out of Food in the Last Year: Never true    Ran Out of Food in the Last Year: Never true  Transportation Needs: No Transportation Needs (11/15/2021)   PRAPARE - Hydrologist (Medical): No    Lack of Transportation (Non-Medical):  No  Physical Activity: Inactive (11/15/2021)   Exercise Vital Sign    Days of Exercise per Week: 0 days    Minutes of Exercise per Session: 0 min  Stress: No Stress Concern Present (11/15/2021)   [place]    Feeling of Stress : Only a little  Social Connections: Moderately Isolated (11/15/2021)   Social Connection and Isolation Panel [NHANES]    Frequency of Communication with Friends and Family: More than three times a week    Frequency of Social Gatherings with Friends and Family: Once a week    Attends Religious Services: Never    Marine scientist or Organizations: No    Attends Music therapist: Never    Marital Status: Living with partner     Family History: The patient's family history includes Diabetes in his father; Heart disease in his father; Hypertension in his mother and sister; Stroke in his father.  ROS:   Please see the history of present illness.    All other systems reviewed and are negative.  EKGs/Labs/Other Studies Reviewed:    The following studies were reviewed today:   Recent Labs: 10/10/2021: B Natriuretic Peptide 191.0 11/25/2021: TSH 1.960 12/24/2021: Magnesium 2.2 04/25/2022: ALT 22; BUN 21; Creatinine, Ser 0.99; Hemoglobin 12.6; Platelets 297; Potassium 4.4; Sodium 133   Recent Lipid  Panel    Component Value Date/Time   CHOL 126 08/21/2020 0530   CHOL 136 04/25/2019 1608   TRIG 89 08/21/2020 0530   HDL 28 (L) 08/21/2020 0530   HDL 28 (L) 04/25/2019 1608   CHOLHDL 4.5 08/21/2020 0530   VLDL 18 08/21/2020 0530   LDLCALC 80 08/21/2020 0530   LDLCALC 78 04/25/2019 1608    Physical Exam:    VS:  BP 130/64   Pulse 100   Ht 5\' 6"  (1.676 m)   Wt 151 lb (68.5 kg)   SpO2 95%   BMI 24.37 kg/m     Wt Readings from Last 3 Encounters:  05/02/22 151 lb (68.5 kg)  03/20/22 146 lb 12.8 oz (66.6 kg)  03/20/22 148 lb (67.1 kg)     GEN: Well nourished, well developed in no acute distress CARDIAC: RRR, no murmurs, rubs, gallops RESPIRATORY:  Clear to auscultation without rales, wheezing or rhonchi        ASSESSMENT:    1. Typical atrial flutter ([place])   2. Chronic anemia   3. Chronic diastolic heart failure ([place])   4. Primary hypertension    PLAN:    In order of problems listed above:  #Typical atrial flutter Post ablation of the CTI January 13, 2022 without recurrence.  No evidence of atrial fibrillation post flutter ablation.  Plan to implant ILR today. Procedure reviewed including risk and monthly monitoring costs and he wishes to proceed. He will stop his eliquis today and start Aspirin 81mg  PO daily.  #Chronic anemia Given his chronic anemia, the patient would like to avoid long-term exposure to anticoagulation and have a mechanism to monitor for atrial fibrillation in the future.  We discussed using a loop recorder and he is interested in proceeding.  Loop recorder implant was discussed in detail with the patient including the risks and monthly monitoring costs and he wishes to proceed.    Medication Adjustments/Labs and Tests Ordered: Current medicines are reviewed at length with the patient today.  Concerns regarding medicines are outlined above.   No orders of the defined types were placed in  this encounter.  No orders of the defined types were  placed in this encounter.  I,Mathew Stumpf,acting as a Education administrator for Vickie Epley, MD.,have documented all relevant documentation on the behalf of Vickie Epley, MD,as directed by  Vickie Epley, MD while in the presence of Vickie Epley, MD.  I, Vickie Epley, MD, have reviewed all documentation for this visit. The documentation on 05/02/22 for the exam, diagnosis, procedures, and orders are all accurate and complete.   Signed, Lars Mage, MD, [facility], [facility] 05/02/2022 3:46 PM    Electrophysiology [facility]  -------------------------------  SURGEON:  Madelin Rear     PREPROCEDURE DIAGNOSIS:  Atrial fibrillation    POSTPROCEDURE DIAGNOSIS: Atrial fibrillation     PROCEDURES:   1. Implantable loop recorder implantation    INTRODUCTION:  FREDRICK GEOGHEGAN presents with a history of atrial fibrillation The costs of loop recorder monitoring have been discussed with the patient.    DESCRIPTION OF PROCEDURE:  Informed written consent was obtained.  The patient required no sedation for the procedure today.  Mapping over the patient's chest was performed to identify the area where electrograms were most prominent for ILR recording.  This area was found to be the left parasternal region over the 4th intercostal space. The patients left chest was therefore prepped and draped in the usual sterile fashion. The skin overlying the left parasternal region was infiltrated with lidocaine for local analgesia.  A 0.5-cm incision was made over the left parasternal region over the 3rd intercostal space.  A subcutaneous ILR pocket was fashioned using a combination of sharp and blunt dissection.  A [employer] LUX-Dx [phone]) implantable loop recorder was then placed into the pocket  R waves were very prominent and measured >0.32mV.  Steri- Strips and a sterile dressing were then applied.  There were no early apparent complications.     CONCLUSIONS:   1.  Successful implantation of a implantable loop recorder for Atrial fibrillation  2. No early apparent complications.   Lysbeth Galas T. Quentin Ore, MD, [facility], [facility] Cardiac Electrophysiology

## 2022-05-02 NOTE — Patient Instructions (Signed)
Medication Instructions:  Your physician has recommended you make the following change in your medication:  1) STOP taking Eliquis 2) START taking Aspirin 81 mg daily 3) START taking Toprol (metoprolol succinate) 12.5 mg every night  Labwork: None ordered.  Testing/Procedures: None ordered.  Follow-Up:  Your physician wants you to follow-up in: one year with Oda Kilts, PA-C or Tommye Standard, PA-C.  You will receive a reminder letter in the mail two months in advance. If you don't receive a letter, please call our office to schedule the follow-up appointment.    Implantable Loop Recorder Placement, Care After This sheet gives you information about how to care for yourself after your procedure. Your health care provider may also give you more specific instructions. If you have problems or questions, contact your health care provider. What can I expect after the procedure? After the procedure, it is common to have: Soreness or discomfort near the incision. Some swelling or bruising near the incision.  Follow these instructions at home: Incision care  Monitor your cardiac device site for redness, swelling, and drainage. Call the device clinic at [phone] if you experience these symptoms or fever/chills.  Keep the large square bandage on your site for 24 hours and then you may remove it yourself. Keep the steri-strips underneath in place.   You may shower after 72 hours / 3 days from your procedure with the steri-strips in place. They will usually fall off on their own, or may be removed after 10 days. Pat dry.   Avoid lotions, ointments, or perfumes over your incision until it is well-healed.  Please do not submerge in water until your site is completely healed.   Your device is MRI compatible.   Remote monitoring is used to monitor your cardiac device from home. This monitoring is scheduled every month by our office. It allows [place] to keep an eye on the function of your device  to ensure it is working properly.  If your wound site starts to bleed apply pressure.    For help with the monitor please call [employer] Monitor Support Specialist directly at [phone].    If you have any questions/concerns please call the device clinic at [phone].  Activity  Return to your normal activities.  General instructions Follow instructions from your health care provider about how to manage your implantable loop recorder and transmit the information. Learn how to activate a recording if this is necessary for your type of device. You may go through a metal detection gate, and you may let someone hold a metal detector over your chest. Show your ID card if needed. Do not have an MRI unless you check with your health care provider first. Take over-the-counter and prescription medicines only as told by your health care provider. Keep all follow-up visits as told by your health care provider. This is important. Contact a health care provider if: You have redness, swelling, or pain around your incision. You have a fever. You have pain that is not relieved by your pain medicine. You have triggered your device because of fainting (syncope) or because of a heartbeat that feels like it is racing, slow, fluttering, or skipping (palpitations). Get help right away if you have: Chest pain. Difficulty breathing. Summary After the procedure, it is common to have soreness or discomfort near the incision. Change your dressing as told by your health care provider. Follow instructions from your health care provider about how to manage your implantable loop recorder and transmit the information.  Keep all follow-up visits as told by your health care provider. This is important. This information is not intended to replace advice given to you by your health care provider. Make sure you discuss any questions you have with your health care provider. Document Released: 03/05/2015 Document  Revised: 05/09/2017 Document Reviewed: 05/09/2017 Elsevier Patient Education  2020 [employer].

## 2022-05-02 NOTE — Progress Notes (Deleted)
Electrophysiology Office Follow up Visit Note:    Date:  05/02/2022   ID:  John [name], DOB 1945/05/24, MRN [id number]  PCP:  [name], [place] Cardiologist:  Evalina Field, MD  [facility] Electrophysiologist:  Vickie Epley, MD    Interval History:    John [name] is a 77 y.o. male who presents for a follow up visit. They were last seen in clinic February 18, 2022.  He had a flutter ablation on January 13, 2022.  He has done well after the ablation without recurrence of arrhythmia.  No evidence of atrial fibrillation after his flutter ablation.  We discussed using a loop recorder for atrial fibrillation surveillance moving forward and he is interested in proceeding.  This will allow him to discontinue his anticoagulation which is favorable given his history of anemia.       Past Medical History:  Diagnosis Date   Adenocarcinoma of lung, right ([place]) 04/18/2016   Anemia    Anxiety    Arthritis    Asthma    Atrial flutter (HCC)    BPH (benign prostatic hyperplasia)    with urinary retention 02/06/20   CHF (congestive heart failure) (HCC)    COPD (chronic obstructive pulmonary disease) ([place])    Depression    Diabetes mellitus, type II ([place])    Dyspnea    History of kidney stones    History of radiation therapy    right lung 09/10/2020-09/17/2020   Dr Sondra Come   Hyperlipidemia    Hypertension    Hypothyroidism    Macular degeneration    Neuropathy    Non-small cell lung cancer, right ([place]) 04/18/2016   Peripheral vascular disease ([place])    Prostatitis    Pulmonary nodule, left 07/16/2016   Sleep apnea    cpap    Past Surgical History:  Procedure Laterality Date   A-FLUTTER ABLATION N/A 01/13/2022   Procedure: A-FLUTTER ABLATION;  Surgeon: Vickie Epley, MD;  Location: [place] [facility];  Service: Cardiovascular;  Laterality: N/A;   ABDOMINAL AORTOGRAM W/LOWER EXTREMITY Left 02/06/2020   Procedure: ABDOMINAL AORTOGRAM W/LOWER  EXTREMITY;  Surgeon: Lorretta Harp, MD;  Location: [place] [facility];  Service: Cardiovascular;  Laterality: Left;   ABDOMINAL AORTOGRAM W/LOWER EXTREMITY N/A 05/30/2021   Procedure: ABDOMINAL AORTOGRAM W/LOWER EXTREMITY;  Surgeon: Marty Heck, MD;  Location: [place] [facility];  Service: Cardiovascular;  Laterality: N/A;   BIOPSY  09/16/2021   Procedure: BIOPSY;  Surgeon: Gatha Mayer, MD;  Location: [place];  Service: Gastroenterology;;   CARDIOVERSION N/A 10/05/2020   Procedure: CARDIOVERSION;  Surgeon: Geralynn Rile, MD;  Location: [place];  Service: Cardiovascular;  Laterality: N/A;   CATARACT EXTRACTION, BILATERAL Bilateral    COLONOSCOPY WITH PROPOFOL N/A 06/20/2021   Procedure: COLONOSCOPY WITH PROPOFOL;  Surgeon: Irene Shipper, MD;  [facility]: [facility];  Service: Endoscopy;  Laterality: N/A;   COLONOSCOPY WITH PROPOFOL N/A 09/16/2021   Procedure: COLONOSCOPY WITH PROPOFOL;  Surgeon: Gatha Mayer, MD;  Location: [place];  Service: Gastroenterology;  Laterality: N/A;   ENDARTERECTOMY FEMORAL Right 08/20/2020   Procedure: ENDARTERECTOMY  RIGHT FEMORAL ARTERY;  Surgeon: Marty Heck, MD;  Location: [place];  Service: Vascular;  Laterality: Right;   ENTEROSCOPY N/A 10/11/2021   Procedure: ENTEROSCOPY;  Surgeon: Harvel Quale, MD;  [facility];  Service: Gastroenterology;  Laterality: N/A;   ESOPHAGOGASTRODUODENOSCOPY N/A 09/16/2021   Procedure: ESOPHAGOGASTRODUODENOSCOPY (EGD);  Surgeon:  Gatha Mayer, MD;  Location: [facility];  Service: Gastroenterology;  Laterality: N/A;   ESOPHAGOGASTRODUODENOSCOPY (EGD) WITH PROPOFOL N/A 09/06/2020   Procedure: ESOPHAGOGASTRODUODENOSCOPY (EGD) WITH PROPOFOL;  Surgeon: Irene Shipper, MD;  Location: [facility];  Service: Endoscopy;  Laterality: N/A;   ESOPHAGOGASTRODUODENOSCOPY (EGD) WITH PROPOFOL N/A 10/11/2021   Procedure: ESOPHAGOGASTRODUODENOSCOPY (EGD) WITH PROPOFOL;  Surgeon:  Harvel Quale, MD;  [facility];  Service: Gastroenterology;  Laterality: N/A;   HEMOSTASIS CLIP PLACEMENT  09/16/2021   Procedure: HEMOSTASIS CLIP PLACEMENT;  Surgeon: Gatha Mayer, MD;  Location: [place];  Service: Gastroenterology;;   HOT HEMOSTASIS N/A 09/16/2021   Procedure: HOT HEMOSTASIS (ARGON PLASMA COAGULATION/BICAP);  Surgeon: Gatha Mayer, MD;  Location: [place];  Service: Gastroenterology;  Laterality: N/A;   HOT HEMOSTASIS  10/11/2021   Procedure: HOT HEMOSTASIS (ARGON PLASMA COAGULATION/BICAP);  Surgeon: Montez Morita, Quillian Quince, MD;  [facility];  Service: Gastroenterology;;   INSERTION OF ILIAC STENT Right 08/20/2020   Procedure: RETROGRADE INSERTION OF RIGHT ILIAC STENT;  Surgeon: Marty Heck, MD;  Location: [place];  Service: Vascular;  Laterality: Right;   INTRAOPERATIVE ARTERIOGRAM Right 08/20/2020   Procedure: INTRA OPERATIVE ARTERIOGRAM ILIAC;  Surgeon: Marty Heck, MD;  Location: [place];  Service: Vascular;  Laterality: Right;   PATCH ANGIOPLASTY Right 08/20/2020   Procedure: PATCH ANGIOPLASTY RIGHT FEMORAL ARTERY;  Surgeon: Marty Heck, MD;  Location: [place];  Service: Vascular;  Laterality: Right;   PERIPHERAL VASCULAR BALLOON ANGIOPLASTY Right 05/30/2021   Procedure: PERIPHERAL VASCULAR BALLOON ANGIOPLASTY;  Surgeon: Marty Heck, MD;  Location: [place] [facility];  Service: Cardiovascular;  Laterality: Right;   PORTACATH PLACEMENT Left 06/13/2016   Procedure: INSERTION PORT-A-CATH;  Surgeon: Aviva Signs, MD;  [facility];  Service: General;  Laterality: Left;   TRANSURETHRAL RESECTION OF PROSTATE N/A 05/31/2020   Procedure: TRANSURETHRAL RESECTION OF THE PROSTATE (TURP);  Surgeon: Cleon Gustin, MD;  [facility];  Service: Urology;  Laterality: N/A;   VIDEO BRONCHOSCOPY WITH ENDOBRONCHIAL NAVIGATION N/A 05/28/2016   Procedure: VIDEO BRONCHOSCOPY WITH ENDOBRONCHIAL NAVIGATION;   Surgeon: [name], MD;  Location: [place];  Service: Thoracic;  Laterality: N/A;   VIDEO BRONCHOSCOPY WITH ENDOBRONCHIAL ULTRASOUND N/A 05/28/2016   Procedure: VIDEO BRONCHOSCOPY WITH ENDOBRONCHIAL ULTRASOUND;  Surgeon: [name], MD;  Location: [place];  Service: Thoracic;  Laterality: N/A;    Current Medications: No outpatient medications have been marked as taking for the 05/02/22 encounter (Appointment) with Vickie Epley, MD.     Allergies:   Sulfa antibiotics, Atorvastatin, Jardiance [empagliflozin], Lopressor [metoprolol], Rosuvastatin, Doxycycline, and Temazepam   Social History   Socioeconomic History   Marital status: Soil scientist    Spouse name: Not on file   Number of children: 5   Years of education: 10   Highest education level: 10th grade  Occupational History   Occupation: Retired  Tobacco Use   Smoking status: Every Day    Packs/day: 0.50    Years: 55.00    Total pack years: 27.50    Types: Cigarettes    Start date: 03/11/1961   Smokeless tobacco: Never   Tobacco comments:    1/2 pack to 1 pack per day 12/14/19  Vaping Use   Vaping Use: Never used  Substance and Sexual Activity   Alcohol use: No   Drug use: No   Sexual activity: Yes    Birth control/protection: None  Other Topics Concern  Not on file  Social History Narrative   [name] is his POA and lives with him. Children out of town - one in [facility], others in [place].   Social Determinants of Health   Financial Resource Strain: Low Risk  (11/15/2021)   Overall Financial Resource Strain (CARDIA)    Difficulty of Paying Living Expenses: Not hard at all  Food Insecurity: No Food Insecurity (11/15/2021)   Hunger Vital Sign    Worried About Running Out of Food in the Last Year: Never true    Ran Out of Food in the Last Year: Never true  Transportation Needs: No Transportation Needs (11/15/2021)   PRAPARE - Hydrologist (Medical): No    Lack of  Transportation (Non-Medical): No  Physical Activity: Inactive (11/15/2021)   Exercise Vital Sign    Days of Exercise per Week: 0 days    Minutes of Exercise per Session: 0 min  Stress: No Stress Concern Present (11/15/2021)   [place]    Feeling of Stress : Only a little  Social Connections: Moderately Isolated (11/15/2021)   Social Connection and Isolation Panel [NHANES]    Frequency of Communication with Friends and Family: More than three times a week    Frequency of Social Gatherings with Friends and Family: Once a week    Attends Religious Services: Never    Marine scientist or Organizations: No    Attends Music therapist: Never    Marital Status: Living with partner     Family History: The patient's family history includes Diabetes in his father; Heart disease in his father; Hypertension in his mother and sister; Stroke in his father.  ROS:   Please see the history of present illness.    All other systems reviewed and are negative.  EKGs/Labs/Other Studies Reviewed:    The following studies were reviewed today:   Recent Labs: 10/10/2021: B Natriuretic Peptide 191.0 11/25/2021: TSH 1.960 12/24/2021: Magnesium 2.2 04/25/2022: ALT 22; BUN 21; Creatinine, Ser 0.99; Hemoglobin 12.6; Platelets 297; Potassium 4.4; Sodium 133  Recent Lipid Panel    Component Value Date/Time   CHOL 126 08/21/2020 0530   CHOL 136 04/25/2019 1608   TRIG 89 08/21/2020 0530   HDL 28 (L) 08/21/2020 0530   HDL 28 (L) 04/25/2019 1608   CHOLHDL 4.5 08/21/2020 0530   VLDL 18 08/21/2020 0530   LDLCALC 80 08/21/2020 0530   LDLCALC 78 04/25/2019 1608    Physical Exam:    VS:  There were no vitals taken for this visit.    Wt Readings from Last 3 Encounters:  03/20/22 146 lb 12.8 oz (66.6 kg)  03/20/22 148 lb (67.1 kg)  03/03/22 147 lb (66.7 kg)     GEN: Well nourished, well developed in no acute  distress CARDIAC: ***RRR, no murmurs, rubs, gallops RESPIRATORY:  Clear to auscultation without rales, wheezing or rhonchi        ASSESSMENT:    1. Typical atrial flutter ([place])   2. Chronic anemia   3. Chronic diastolic heart failure ([place])   4. Primary hypertension    PLAN:    In order of problems listed above:  #Typical atrial flutter Post ablation of the CTI January 13, 2022 without recurrence.  No evidence of atrial fibrillation post flutter ablation.  #Chronic anemia Given his chronic anemia, the patient would like to avoid long-term exposure to anticoagulation and have a mechanism to monitor  for atrial fibrillation in the future.  We discussed using a loop recorder and he is interested in proceeding.  Loop recorder implant was discussed in detail with the patient including the risks and monthly monitoring costs and he wishes to proceed.    Medication Adjustments/Labs and Tests Ordered: Current medicines are reviewed at length with the patient today.  Concerns regarding medicines are outlined above.  No orders of the defined types were placed in this encounter.  No orders of the defined types were placed in this encounter.    Signed, Lars Mage, MD, [facility], [facility] 05/02/2022 5:56 AM    Electrophysiology [facility]  -------------------------------  SURGEON:  Vickie Epley, MD     PREPROCEDURE DIAGNOSIS:  Atrial fibrillation    POSTPROCEDURE DIAGNOSIS: Atrial fibrillation     PROCEDURES:   1. Implantable loop recorder implantation    INTRODUCTION:  John [name] presents with a history of atrial fibrillation The costs of loop recorder monitoring have been discussed with the patient.    DESCRIPTION OF PROCEDURE:  Informed written consent was obtained.  The patient required no sedation for the procedure today.  Mapping over the patient's chest was performed to identify the area where electrograms were most prominent for ILR  recording.  This area was found to be the left parasternal region over the 4th intercostal space. The patients left chest was therefore prepped and draped in the usual sterile fashion. The skin overlying the left parasternal region was infiltrated with lidocaine for local analgesia.  A 0.5-cm incision was made over the left parasternal region over the 3rd intercostal space.  A subcutaneous ILR pocket was fashioned using a combination of sharp and blunt dissection.  A [employer] LUX implantable loop recorder was then placed into the pocket  R waves were very prominent and measured >0.80mV.  Steri- Strips and a sterile dressing were then applied.  There were no early apparent complications.     CONCLUSIONS:   1. Successful implantation of a implantable loop recorder for Atrial fibrillation  2. No early apparent complications.   Lysbeth Galas T. Quentin Ore, MD, [facility], [facility] Cardiac Electrophysiology

## 2022-05-05 ENCOUNTER — Encounter: Payer: Self-pay | Admitting: Hematology

## 2022-05-05 ENCOUNTER — Inpatient Hospital Stay (HOSPITAL_BASED_OUTPATIENT_CLINIC_OR_DEPARTMENT_OTHER): Payer: Medicare Other | Admitting: Hematology

## 2022-05-05 VITALS — BP 108/63 | HR 100 | Temp 97.6°F | Resp 20 | Ht 66.0 in | Wt 152.0 lb

## 2022-05-05 DIAGNOSIS — D649 Anemia, unspecified: Secondary | ICD-10-CM | POA: Diagnosis not present

## 2022-05-05 DIAGNOSIS — Z85118 Personal history of other malignant neoplasm of bronchus and lung: Secondary | ICD-10-CM | POA: Diagnosis not present

## 2022-05-05 DIAGNOSIS — E039 Hypothyroidism, unspecified: Secondary | ICD-10-CM | POA: Diagnosis not present

## 2022-05-05 DIAGNOSIS — C3491 Malignant neoplasm of unspecified part of right bronchus or lung: Secondary | ICD-10-CM | POA: Diagnosis not present

## 2022-05-05 DIAGNOSIS — Z08 Encounter for follow-up examination after completed treatment for malignant neoplasm: Secondary | ICD-10-CM | POA: Diagnosis not present

## 2022-05-05 NOTE — Patient Instructions (Signed)
[place] at [facility] Discharge Instructions   You were seen and examined today by Dr. Delton Coombes.  He reviewed the results of your CT scan. There are two spots in the right and left lungs that have slowly been increasing in size.  We will order a PET scan to investigate this further. If these spots light up on the PET, we will refer you back to [facility] for more radiation.   Return as scheduled.    Thank you for choosing [place] at [facility] to provide your oncology and hematology care.  To afford each patient quality time with our provider, please arrive at least 15 minutes before your scheduled appointment time.   If you have a lab appointment with the [facility] please come in thru the Main Entrance and check in at the main information desk.  You need to re-schedule your appointment should you arrive 10 or more minutes late.  We strive to give you quality time with our providers, and arriving late affects you and other patients whose appointments are after yours.  Also, if you no show three or more times for appointments you may be dismissed from the clinic at the providers discretion.     Again, thank you for choosing [facility].  Our hope is that these requests will decrease the amount of time that you wait before being seen by our physicians.       _____________________________________________________________  Should you have questions after your visit to [facility], please contact our office at [phone] and follow the prompts.  Our office hours are 8:00 a.m. and 4:30 p.m. Monday - Friday.  Please note that voicemails left after 4:00 p.m. may not be returned until the following business day.  We are closed weekends and major holidays.  You do have access to a nurse 24-7, just call the main number to the clinic [phone] and do not press any options, hold on the line and a nurse will answer  the phone.    For prescription refill requests, have your pharmacy contact our office and allow 72 hours.    Due to Covid, you will need to wear a mask upon entering the hospital. If you do not have a mask, a mask will be given to you at the Main Entrance upon arrival. For doctor visits, patients may have 1 support person age 64 or older with them. For treatment visits, patients can not have anyone with them due to social distancing guidelines and our immunocompromised population.

## 2022-05-05 NOTE — Progress Notes (Signed)
[place], [place] [address]   CLINIC:  Medical Oncology/Hematology  PCP:  [name], [place] / [place] [address] [phone]   REASON FOR VISIT:  Follow-up for right NSCLC  PRIOR THERAPY:  1. Chemoradiation with weekly carboplatin and paclitaxel from 06/25/2016 to 08/20/2016. 2. Consolidation durvalumab from 09/26/2016 to 09/25/2017.  NGS Results: not done  CURRENT THERAPY: surveillance  BRIEF ONCOLOGIC HISTORY:  Oncology History  Non-small cell lung cancer, right ([name])  04/17/2016 Imaging   CT chest- 1. Examination is positive for bilateral upper lobe pulmonary lesions suspicious for malignancy. Further investigation with PET-CT and tissue sampling is advised. 2. Borderline enlarged paratracheal lymph nodes and right hilar nodes. Cannot rule out metastatic adenopathy. Attention to these areas on PET-CT is advised. 3. Emphysema 4. Aortic atherosclerosis and multi vessel coronary artery calcification.   04/18/2016 Initial Diagnosis   Adenocarcinoma of lung, right ([place])   04/24/2016 PET scan   1. Adjacent hypermetabolic nodules in the RIGHT upper lobe consistent bronchogenic carcinoma. 2. Suspicion of ipsilateral hilar and paratracheal nodal metastasis. 3. Nodule in the LEFT upper lobe with suspicious morphology has a relatively low metabolic activity for size. Favor synchronous primary bronchogenic carcinoma. 4. No metabolic activity associated with a small LEFT lower lobe pulmonary nodule. Recommend attention on follow-up.   05/06/2016 Imaging   MRI brain- Negative for metastatic disease.   05/28/2016 Procedure   1. Electromagnetic navigational bronchoscopy with brushings, needle     aspirations and biopsies of bilateral upper lobe nodules. 2. Endobronchial ultrasound with mediastinal and hilar lymph node     aspirations. By Dr. [name]   06/03/2016 Pathology Results   Diagnosis 1. Lung,  biopsy, Right upper lobe - BLOOD AND BENIGN BRONCHIAL AND LUNG TISSUE. 2. Lung, biopsy, Left upper lobe - BLOOD AND BENIGN BRONCHIAL AND LUNG TISSUE.   06/03/2016 Pathology Results   Diagnosis TRANSBRONCHIAL NEEDLE ASPIRATION, NAVIGATION, RUL (SPECIMEN 5 OF 7, COLLECTED 05/28/16): MALIGNANT CELLS CONSISTENT WITH NON-SMALL CELL CARCINOMA Comment There are limited tumor cells in the cell block (insufficient for molecular testing). TTF-1 is positive in a few of the atypical cells. They appear negative for synaptophysin and cytokeratin 5/6. Overall there is limited tumor, but a lung adenocarcinoma is slightly favored. Dr. [name] has reviewed the case. The case was called to Dr. [name] on 06/03/2016.   06/13/2016 Procedure   Port placed by Dr. Arnoldo Morale   06/25/2016 - 08/15/2016 Chemotherapy   The patient had palonosetron (ALOXI) injection 0.25 mg, 0.25 mg, Intravenous,  Once, 6 of 6 cycles  CARBOplatin (PARAPLATIN) 220 mg in sodium chloride 0.9 % 250 mL chemo infusion, 220 mg (100 % of original dose 215.8 mg), Intravenous,  Once, 6 of 6 cycles Dose modification:   (original dose 215.8 mg, Cycle 1),   (original dose 215.8 mg, Cycle 5),   (original dose 215.8 mg, Cycle 6),   (original dose 215.8 mg, Cycle 2),   (original dose 215.8 mg, Cycle 3),   (original dose 215.8 mg, Cycle 4)  PACLitaxel (TAXOL) 90 mg in dextrose 5 % 250 mL chemo infusion ( for chemotherapy treatment.     06/25/2016 - 08/20/2016 Radiation Therapy   Radiation in [place], [place] by Dr. Quitman Livings.  R lung to 6600 cGy   07/09/2016 - 07/10/2016 Hospital Admission   Admit date: 07/09/2016  Admission diagnosis: Fever and chills Additional comments: Negative work-up, discharged with course of Levaquin   07/09/2016 Treatment Plan Change  Treatment deferred x 1 week due to hospitalization    08/05/2016 - 08/06/2016 Hospital Admission   Admit date: 08/05/2016 Admission diagnosis: Joint pain Additional comments: Treated with steroids with  symptomatic improvement   08/25/2016 Imaging   MRI brain- Negative for intracranial metastasis on this motion degraded examination.  Stable mild-to-moderate chronic small vessel ischemic disease.   09/18/2016 PET scan   1. Partial metabolic response. Right upper lobe 2.6 cm hypermetabolic pulmonary nodule, decreased in size and metabolism. Mildly hypermetabolic right hilar nodal metastasis, decreased in metabolism. Right paratracheal lymph node is no longer hypermetabolic. No new or progressive metastatic disease. 2. Apical left upper lobe 0.9 cm solid pulmonary nodule, slightly decreased in size, with no significant metabolism. The change in size could indicate a neoplastic etiology for this nodule. 3. Additional subcentimeter pulmonary nodules in the left lung are stable and below PET resolution . 4. Additional findings include aortic atherosclerosis, coronary atherosclerosis, moderate emphysema, stable small pericardial effusion/thickening and mild sigmoid diverticulosis.   01/13/2017 Imaging   CT chest with contrast IMPRESSION: 1. No change in size of the hypermetabolic nodular thickening in the RIGHT upper lobe. 2. LEFT upper lobe spiculated nodule is stable. 3. Rounded LEFT lower lobe pulmonary nodule is stable. 4. No evidence of lung cancer progression.   05/08/2017 Adverse Reaction   Development of Hypothyroidism- likely secondary to immunotherapy.  Levothyroxine therapy initiated.      CANCER STAGING:  Cancer Staging  Non-small cell lung cancer, right [facility]) Staging form: Lung, AJCC 8th Edition - Clinical stage from 06/05/2016: Stage IIIA (cT1b(2), cN2, cM0) - Signed by Baird Cancer, PA-C on 08/21/2016   INTERVAL HISTORY:  John [name], a 77 y.o. male, seen for follow-up of right-sided non-small cell lung cancer.  Baseline cough and shortness of breath have been stable.  Denies any recent respiratory infections.  REVIEW OF SYSTEMS:  Review of Systems   Constitutional:  Positive for fatigue.  Respiratory:  Positive for cough and shortness of breath.   Neurological:  Positive for numbness.  Psychiatric/Behavioral:  Positive for sleep disturbance.   All other systems reviewed and are negative.   PAST MEDICAL/SURGICAL HISTORY:  Past Medical History:  Diagnosis Date   Adenocarcinoma of lung, right ([place]) 04/18/2016   Anemia    Anxiety    Arthritis    Asthma    Atrial flutter (H[facility])    BPH (benign prostatic hyperplasia)    with urinary retention 02/06/20   CHF (congestive heart failure) (HCC)    COPD (chronic obstructive pulmonary disease) ([place])    Depression    Diabetes mellitus, type II ([place])    Dyspnea    History of kidney stones    History of radiation therapy    right lung 09/10/2020-09/17/2020   Dr Sondra Come   Hyperlipidemia    Hypertension    Hypothyroidism    Macular degeneration    Neuropathy    Non-small cell lung cancer, right ([place]) 04/18/2016   Peripheral vascular disease ([place])    Prostatitis    Pulmonary nodule, left 07/16/2016   Sleep apnea    cpap   Past Surgical History:  Procedure Laterality Date   A-FLUTTER ABLATION N/A 01/13/2022   Procedure: A-FLUTTER ABLATION;  Surgeon: Vickie Epley, MD;  Location: [facility];  Service: Cardiovascular;  Laterality: N/A;   ABDOMINAL AORTOGRAM W/LOWER EXTREMITY Left 02/06/2020   Procedure: ABDOMINAL AORTOGRAM W/LOWER EXTREMITY;  Surgeon: Lorretta Harp, MD;  Location: [facility];  Service: Cardiovascular;  Laterality: Left;   ABDOMINAL AORTOGRAM W/LOWER EXTREMITY N/A 05/30/2021   Procedure: ABDOMINAL AORTOGRAM W/LOWER EXTREMITY;  Surgeon: Marty Heck, MD;  Location: [facility];  Service: Cardiovascular;  Laterality: N/A;   BIOPSY  09/16/2021   Procedure: BIOPSY;  Surgeon: Gatha Mayer, MD;  Location: [facility];  Service: Gastroenterology;;   CARDIOVERSION N/A 10/05/2020   Procedure: CARDIOVERSION;  Surgeon: Geralynn Rile, MD;   Location: [facility];  Service: Cardiovascular;  Laterality: N/A;   CATARACT EXTRACTION, BILATERAL Bilateral    COLONOSCOPY WITH PROPOFOL N/A 06/20/2021   Procedure: COLONOSCOPY WITH PROPOFOL;  Surgeon: Irene Shipper, MD;  Location: [facility];  Service: Endoscopy;  Laterality: N/A;   COLONOSCOPY WITH PROPOFOL N/A 09/16/2021   Procedure: COLONOSCOPY WITH PROPOFOL;  Surgeon: Gatha Mayer, MD;  Location: [facility];  Service: Gastroenterology;  Laterality: N/A;   ENDARTERECTOMY FEMORAL Right 08/20/2020   Procedure: ENDARTERECTOMY  RIGHT FEMORAL ARTERY;  Surgeon: Marty Heck, MD;  Location: [facility];  Service: Vascular;  Laterality: Right;   ENTEROSCOPY N/A 10/11/2021   Procedure: ENTEROSCOPY;  Surgeon: Harvel Quale, MD;  [facility];  Service: Gastroenterology;  Laterality: N/A;   ESOPHAGOGASTRODUODENOSCOPY N/A 09/16/2021   Procedure: ESOPHAGOGASTRODUODENOSCOPY (EGD);  Surgeon: Gatha Mayer, MD;  Location: [facility];  Service: Gastroenterology;  Laterality: N/A;   ESOPHAGOGASTRODUODENOSCOPY (EGD) WITH PROPOFOL N/A 09/06/2020   Procedure: ESOPHAGOGASTRODUODENOSCOPY (EGD) WITH PROPOFOL;  Surgeon: Irene Shipper, MD;  Location[facility];  Service: Endoscopy;  Laterality: N/A;   ESOPHAGOGASTRODUODENOSCOPY (EGD) WITH PROPOFOL N/A 10/11/2021   Procedure: ESOPHAGOGASTRODUODENOSCOPY (EGD) WITH PROPOFOL;  Surgeon: Harvel Quale, MD;  [facility];  Service: Gastroenterology;  Laterality: N/A;   HEMOSTASIS CLIP PLACEMENT  09/16/2021   Procedure: HEMOSTASIS CLIP PLACEMENT;  Surgeon: Gatha Mayer, MD;  Location: [facility];  Service: Gastroenterology;;   HOT HEMOSTASIS N/A 09/16/2021   Procedure: HOT HEMOSTASIS (ARGON PLASMA COAGULATION/BICAP);  Surgeon: Gatha Mayer, MD;  Location: [facility];  Service: Gastroenterology;  Laterality: N/A;   HOT HEMOSTASIS  10/11/2021   Procedure: HOT HEMOSTASIS (ARGON PLASMA COAGULATION/BICAP);  Surgeon:  Montez Morita, Quillian Quince, MD;  [facility];  Service: Gastroenterology;;   INSERTION OF ILIAC STENT Right 08/20/2020   Procedure: RETROGRADE INSERTION OF RIGHT ILIAC STENT;  Surgeon: Marty Heck, MD;  Location: [facility];  Service: Vascular;  Laterality: Right;   INTRAOPERATIVE ARTERIOGRAM Right 08/20/2020   Procedure: INTRA OPERATIVE ARTERIOGRAM ILIAC;  Surgeon: Marty Heck, MD;  Location: [facility];  Service: Vascular;  Laterality: Right;   PATCH ANGIOPLASTY Right 08/20/2020   Procedure: PATCH ANGIOPLASTY RIGHT FEMORAL ARTERY;  Surgeon: Marty Heck, MD;  Location: [facility];  Service: Vascular;  Laterality: Right;   PERIPHERAL VASCULAR BALLOON ANGIOPLASTY Right 05/30/2021   Procedure: PERIPHERAL VASCULAR BALLOON ANGIOPLASTY;  Surgeon: Marty Heck, MD;  Location: [facility];  Service: Cardiovascular;  Laterality: Right;   PORTACATH PLACEMENT Left 06/13/2016   Procedure: INSERTION PORT-A-CATH;  Surgeon: Aviva Signs, MD;  [facility];  Service: General;  Laterality: Left;   TRANSURETHRAL RESECTION OF PROSTATE N/A 05/31/2020   Procedure: TRANSURETHRAL RESECTION OF THE PROSTATE (TURP);  Surgeon: Cleon Gustin, MD;  [facility];  Service: Urology;  Laterality: N/A;   VIDEO BRONCHOSCOPY WITH ENDOBRONCHIAL NAVIGATION N/A 05/28/2016   Procedure: VIDEO BRONCHOSCOPY WITH ENDOBRONCHIAL NAVIGATION;  Surgeon: [name], MD;  Location: [facility];  Service: Thoracic;  Laterality: N/A;   VIDEO BRONCHOSCOPY WITH  ENDOBRONCHIAL ULTRASOUND N/A 05/28/2016   Procedure: VIDEO BRONCHOSCOPY WITH ENDOBRONCHIAL ULTRASOUND;  Surgeon: [name], MD;  [facility];  Service: Thoracic;  Laterality: N/A;    SOCIAL HISTORY:  Social History   Socioeconomic History   Marital status: Soil scientist    Spouse name: Not on file   Number of children: 5   Years of education: 10   Highest education level: 10th grade  Occupational History   Occupation:  Retired  Tobacco Use   Smoking status: Every Day    Packs/day: 0.50    Years: 55.00    Total pack years: 27.50    Types: Cigarettes    Start date: 03/11/1961   Smokeless tobacco: Never   Tobacco comments:    1/2 pack to 1 pack per day 12/14/19  Vaping Use   Vaping Use: Never used  Substance and Sexual Activity   Alcohol use: No   Drug use: No   Sexual activity: Yes    Birth control/protection: None  Other Topics Concern   Not on file  Social History Narrative   [name] is his POA and lives with him. Children out of town - one in [facility], others in [place].   Social Determinants of Health   Financial Resource Strain: Low Risk  (11/15/2021)   Overall Financial Resource Strain (CARDIA)    Difficulty of Paying Living Expenses: Not hard at all  Food Insecurity: No Food Insecurity (11/15/2021)   Hunger Vital Sign    Worried About Running Out of Food in the Last Year: Never true    Ran Out of Food in the Last Year: Never true  Transportation Needs: No Transportation Needs (11/15/2021)   PRAPARE - Hydrologist (Medical): No    Lack of Transportation (Non-Medical): No  Physical Activity: Inactive (11/15/2021)   Exercise Vital Sign    Days of Exercise per Week: 0 days    Minutes of Exercise per Session: 0 min  Stress: No Stress Concern Present (11/15/2021)   [place]    Feeling of Stress : Only a little  Social Connections: Moderately Isolated (11/15/2021)   Social Connection and Isolation Panel [NHANES]    Frequency of Communication with Friends and Family: More than three times a week    Frequency of Social Gatherings with Friends and Family: Once a week    Attends Religious Services: Never    Marine scientist or Organizations: No    Attends Archivist Meetings: Never    Marital Status: Living with partner  Intimate Partner Violence: Not At Risk (11/15/2021)    Humiliation, Afraid, Rape, and Kick questionnaire    Fear of Current or Ex-Partner: No    Emotionally Abused: No    Physically Abused: No    Sexually Abused: No    FAMILY HISTORY:  Family History  Problem Relation Age of Onset   Hypertension Mother    Diabetes Father    Heart disease Father    Stroke Father    Hypertension Sister     CURRENT MEDICATIONS:  Current Outpatient Medications  Medication Sig Dispense Refill   aspirin EC 81 MG tablet Take 1 tablet (81 mg total) by mouth daily. Swallow whole. 90 tablet 3   Blood Glucose Monitoring Suppl (ONETOUCH VERIO REFLECT) w/Device KIT Use to test blood sugars daily as directed. DX: E11.9 1 kit 1   colchicine 0.6 MG tablet Take 2 tabs immediately,  then 1 tab twice per day for the duration of the flare up to a max of 7 days 21 tablet 0   [employer] IRON 325 (65 Fe) MG tablet TAKE 1 TABLET (325MG ) TWICE DAILY 200 tablet 1   dutasteride (AVODART) 0.5 MG capsule Take 1 capsule (0.5 mg total) by mouth daily. 90 capsule 3   ezetimibe (ZETIA) 10 MG tablet Take 10 mg by mouth daily.     famotidine (PEPCID) 20 MG tablet One after supper (Patient taking differently: Take 20 mg by mouth daily with supper.) 30 tablet 11   fluticasone (FLONASE) 50 MCG/ACT nasal spray PLACE 1 SPRAY INTO BOTH NOSTRILS DAILY AS NEEDED FOR ALLERGIES OR RHINITIS. 48 mL 3   Fluticasone-Umeclidin-Vilant (TRELEGY ELLIPTA) 100-62.5-25 MCG/ACT AEPB Inhale 1 puff into the lungs daily. 1 each 11   gabapentin (NEURONTIN) 300 MG capsule TAKE 1 CAPSULE BY MOUTH THREE TIMES A DAY 270 capsule 0   KLOR-CON M20 20 MEQ tablet TAKE 1 TABLET BY MOUTH EVERY DAY 90 tablet 0   levothyroxine (SYNTHROID) 50 MCG tablet TAKE 1 TABLET BY MOUTH EVERY DAY 90 tablet 1   linaclotide (LINZESS) 72 MCG capsule Take 1 capsule (72 mcg total) by mouth daily as needed (constipation).     metolazone (ZAROXOLYN) 2.5 MG tablet TAKE 1 TABLET BY MOUTH ON MONDAY, WEDNESDAY AND FRIDAY 45 tablet 1   metoprolol succinate  (TOPROL XL) 25 MG 24 hr tablet Take 0.5 tablets (12.5 mg total) by mouth daily. 45 tablet 3   NON FORMULARY CPAP at bedtime     ondansetron (ZOFRAN) 4 MG tablet TAKE 1 TABLET BY MOUTH EVERY 8 HOURS AS NEEDED FOR NAUSEA AND VOMITING 60 tablet 2   OneTouch Delica Lancets 53G MISC Use to test blood sugars daily as directed. DX: E11.9 100 each 1   ONETOUCH VERIO test strip USE TO TEST BLOOD SUGARS DAILY AS DIRECTED. DX: E11.9 100 strip 3   OXYGEN Inhale 2 L into the lungs daily as needed (with Cpap at night).     pravastatin (PRAVACHOL) 40 MG tablet TAKE 1 TABLET (40 MG TOTAL) BY MOUTH ON MONDAYS , WEDNESDAYS, AND FRIDAYS AS DIRECTED 48 tablet 0   Semaglutide (RYBELSUS) 14 MG TABS Take 1 tablet (14 mg total) by mouth daily. 30 tablet 3   spironolactone (ALDACTONE) 25 MG tablet Take 1 tablet (25 mg total) by mouth daily. 90 tablet 3   torsemide (DEMADEX) 20 MG tablet Take 1 tablet (20 mg total) by mouth daily. 90 tablet 2   zolpidem (AMBIEN) 5 MG tablet Take 1 tablet (5 mg total) by mouth at bedtime as needed for sleep. 30 tablet 2   pantoprazole (PROTONIX) 40 MG tablet Take 1 tablet (40 mg total) by mouth 2 (two) times daily before a meal. 60 tablet 5   No current facility-administered medications for this visit.    ALLERGIES:  Allergies  Allergen Reactions   Sulfa Antibiotics Swelling    Mouth swelling   Atorvastatin Other (See Comments)    Muscle aches - tolerating Pravastatin 40 mg MWF   Jardiance [Empagliflozin] Other (See Comments)    FEELS SLUGGISH, TIRED   Lopressor [Metoprolol] Other (See Comments)    Fatigue   Rosuvastatin Other (See Comments)    Muscle aches - tolerating Pravastatin 40 mg MWF   Doxycycline Nausea Only   Temazepam Other (See Comments)    Made insomnia worse     PHYSICAL EXAM:  Performance status (ECOG): 1 - Symptomatic but completely ambulatory  Vitals:   05/05/22 1124  BP: 108/63  Pulse: 100  Resp: 20  Temp: 97.6 F (36.4 C)  SpO2: 98%   Wt  Readings from Last 3 Encounters:  05/05/22 152 lb (68.9 kg)  05/02/22 151 lb (68.5 kg)  03/20/22 146 lb 12.8 oz (66.6 kg)   Physical Exam Vitals reviewed.  Constitutional:      Appearance: Normal appearance.  Cardiovascular:     Rate and Rhythm: Normal rate and regular rhythm.     Pulses: Normal pulses.     Heart sounds: Normal heart sounds.  Pulmonary:     Effort: Pulmonary effort is normal.     Breath sounds: Normal breath sounds.  Neurological:     General: No focal deficit present.     Mental Status: He is alert and oriented to person, place, and time.  Psychiatric:        Mood and Affect: Mood normal.        Behavior: Behavior normal.      LABORATORY DATA:  I have reviewed the labs as listed.     Latest Ref Rng & Units 04/25/2022    9:58 AM 03/20/2022   12:55 PM 03/03/2022    4:44 PM  CBC  WBC 4.0 - 10.5 K/uL 11.0  9.2  16.6   Hemoglobin 13.0 - 17.0 g/dL 12.6  13.6  15.4   Hematocrit 39.0 - 52.0 % 38.1  39.4  43.5   Platelets 150 - 400 K/uL 297  413  447       Latest Ref Rng & Units 04/25/2022    9:58 AM 03/20/2022   12:55 PM 03/03/2022    4:44 PM  CMP  Glucose 70 - 99 mg/dL 202  136  219   BUN 8 - 23 mg/dL 21  20  40   Creatinine 0.61 - 1.24 mg/dL 0.99  1.12  1.64   Sodium 135 - 145 mmol/L 133  139  133   Potassium 3.5 - 5.1 mmol/L 4.4  5.2  4.9   Chloride 98 - 111 mmol/L 94  97  87   CO2 22 - 32 mmol/L 28  27  27    Calcium 8.9 - 10.3 mg/dL 9.1  9.6  9.9   Total Protein 6.5 - 8.1 g/dL 7.0  6.7  7.6   Total Bilirubin 0.3 - 1.2 mg/dL 0.5  <0.2  0.3   Alkaline Phos 38 - 126 U/L 111  144  163   AST 15 - 41 U/L 19  16  15    ALT 0 - 44 U/L 22  15  15      DIAGNOSTIC IMAGING:  I have independently reviewed the scans and discussed with the patient. CT Chest W Contrast  Result Date: 04/25/2022 CLINICAL DATA:  Non-small cell lung cancer follow-up; * Tracking Code: BO * EXAM: CT CHEST WITH CONTRAST TECHNIQUE: Multidetector CT imaging of the chest was performed  during intravenous contrast administration. RADIATION DOSE REDUCTION: This exam was performed according to the departmental dose-optimization program which includes automated exposure control, adjustment of the mA and/or kV according to patient size and/or use of iterative reconstruction technique. CONTRAST:  87mL OMNIPAQUE IOHEXOL 300 MG/ML  SOLN COMPARISON:  Chest CT dated December 24, 2021 and chest CT dated June 19, 2021 FINDINGS: Cardiovascular: Normal heart size. Trace pericardial effusion. Normal caliber thoracic aorta with severe calcified plaque. Severe left main and three-vessel coronary artery calcifications. Mediastinum/Nodes: Esophagus and thyroid are unremarkable. No pathologically enlarged lymph nodes seen in  the chest. Lungs/Pleura: Central airways are patent. Mild centrilobular emphysema. Stable right paramediastinal and left upper lobe postradiation change. Right upper lobe posterior pulmonary nodule is slightly increased in size when compared with prior exam, measuring 1.1 cm in short axis on series 2, image 48, previously 1.0 cm, previously measured 5 mm on June 19, 2021 prior. Solid left upper lobe pulmonary nodule measuring 7 mm on series 2, image 61 is slightly increased in size, previously measured 5 mm, previously measured 4 mm on June 19, 2021 prior. Other solid pulmonary nodules are stable. Reference nodule of the left lower lobe measuring 6 mm on series 2, image 78. Upper Abdomen: No acute abnormality. Musculoskeletal: No chest wall abnormality. No acute or significant osseous findings. IMPRESSION: 1. Stable right paramediastinal and left upper lobe postradiation change. 2. Solid pulmonary nodules of the right upper lobe and left upper lobe have slowly enlarged when compared with multiple prior exams and are concerning for metastatic disease. 3. Other scattered bilateral solid pulmonary nodules are stable. 4. Coronary artery calcifications, aortic Atherosclerosis (ICD10-I70.0) and  Emphysema (ICD10-J43.9). Electronically Signed   By: Yetta Glassman M.D.   On: 04/25/2022 19:50     ASSESSMENT:  1.  Stage IIIa (T1b N2 cM0) NSCLC, favoring adenocarcinoma: -Chemo XRT with weekly carboplatin and paclitaxel completed on 08/20/2016. -Consolidation durvalumab from 09/26/2016 through 09/25/2017. -Reviewed CT chest on 11/07/2019 which showed increasing size of the upper lobe pulmonary nodules with no new nodule in the left upper lobe suspicious for meta stasis.  Also increasing confluent soft tissue in the right suprahilar region, thought to be postradiation changes. -PET scan on 11/21/2019 showed suspected radiation changes in the right paramediastinal/perihilar region although mild hypermetabolism is present in the right retroareolar region.SUV 2.5.  While progressive soft tissue is present in this region, this hypermetabolism remains nonfocal and favors radiation changes.  7 mm central left upper lobe nodule max SUV 1.4.  9 mm aggregate nodule in the lateral right upper lobe max SUV 2.4.  These are minimally progressive from more remote prior scans.  6 mm left lower lobe nodule unchanged.  No hypermetabolic thoracic adenopathy. -CT chest on 03/23/2020 showed many of the lung nodules are stable.  Couple of nodules have grown by few millimeters.  No new areas seen. - SBRT to the right and left lung lesions, 54 Gray in 3 fractions from 09/10/2020 through 09/17/2020.   2.  Normocytic to macrocytic anemia: - Found to have hemoglobin 6.3 on 10/09/2020 and received 2 units PRBC while hospitalized.  I have reviewed hospitalization records. - EGD on 09/06/2020 with tiny gastric ulcer without bleeding or stigmata. - Blood loss this year most likely postoperative in nature (urological surgery in February and vascular surgery in May).   PLAN:  1.  Stage IIIa (T1b N2 cM0) NSCLC, favoring adenocarcinoma: - Reviewed the CT chest from 04/25/2022.  The solid pulmonary nodules in the right upper lobe, 1.1 cm and  left upper lobe nodule 7 mm, previously 5 mm.  Both are increasing in size.  Other scattered bilateral pulmonary nodules are stable.  No clear adenopathy. - Recommend PET scan.  Will consider SBRT to the lesions again.   2.  Hypothyroidism: - Continue Synthroid daily.   3.  Difficulty staying asleep: - Continue temazepam as needed.  4.  Normocytic to macrocytic anemia: - Ferritin is 40 and percent saturation 11.  Hemoglobin is 12.6. - Continue iron tablet daily.     Orders placed this encounter:  Orders Placed  This Encounter  Procedures   NM PET Image Restag (PS) Skull Base To Thigh      Derek Jack, MD [place] [phone]

## 2022-05-06 ENCOUNTER — Other Ambulatory Visit: Payer: Self-pay | Admitting: *Deleted

## 2022-05-06 DIAGNOSIS — M79676 Pain in unspecified toe(s): Secondary | ICD-10-CM | POA: Diagnosis not present

## 2022-05-06 DIAGNOSIS — E1142 Type 2 diabetes mellitus with diabetic polyneuropathy: Secondary | ICD-10-CM

## 2022-05-06 DIAGNOSIS — B351 Tinea unguium: Secondary | ICD-10-CM | POA: Diagnosis not present

## 2022-05-06 DIAGNOSIS — L84 Corns and callosities: Secondary | ICD-10-CM | POA: Diagnosis not present

## 2022-05-06 MED ORDER — RYBELSUS 14 MG PO TABS: 14.0000 mg | ORAL_TABLET | Freq: Every day | ORAL | 3 refills | Status: DC

## 2022-05-09 ENCOUNTER — Other Ambulatory Visit (HOSPITAL_COMMUNITY): Payer: Self-pay

## 2022-05-12 ENCOUNTER — Ambulatory Visit (INDEPENDENT_AMBULATORY_CARE_PROVIDER_SITE_OTHER): Payer: Medicare Other | Admitting: Family

## 2022-05-12 ENCOUNTER — Encounter: Payer: Self-pay | Admitting: Family

## 2022-05-12 VITALS — BP 124/78 | HR 98 | Temp 97.3°F | Ht 66.0 in | Wt 154.6 lb

## 2022-05-12 DIAGNOSIS — J441 Chronic obstructive pulmonary disease with (acute) exacerbation: Secondary | ICD-10-CM | POA: Diagnosis not present

## 2022-05-12 DIAGNOSIS — Z7984 Long term (current) use of oral hypoglycemic drugs: Secondary | ICD-10-CM | POA: Diagnosis not present

## 2022-05-12 DIAGNOSIS — E1142 Type 2 diabetes mellitus with diabetic polyneuropathy: Secondary | ICD-10-CM | POA: Diagnosis not present

## 2022-05-12 DIAGNOSIS — K219 Gastro-esophageal reflux disease without esophagitis: Secondary | ICD-10-CM | POA: Diagnosis not present

## 2022-05-12 MED ORDER — PREDNISONE 10 MG (21) PO TBPK: ORAL_TABLET | ORAL | 0 refills | Status: DC

## 2022-05-12 MED ORDER — PANTOPRAZOLE SODIUM 20 MG PO TBEC: 20.0000 mg | DELAYED_RELEASE_TABLET | Freq: Every day | ORAL | 1 refills | Status: DC

## 2022-05-12 NOTE — Patient Instructions (Signed)
Chronic Obstructive Pulmonary Disease Exacerbation  Chronic obstructive pulmonary disease (COPD) is a long-term (chronic) condition that affects the lungs. COPD is a general term that can be used to describe many different lung problems that cause lung inflammation and limit airflow, including chronic bronchitis and emphysema. COPD exacerbations are episodes when breathing symptoms flare up, become much worse, and require extra treatment. COPD exacerbations are usually caused by infections. Without treatment, COPD exacerbations can be severe and even life threatening. Frequent COPD exacerbations can cause further damage to the lungs. What are the causes? This condition may be caused by: Respiratory infections, including viral and bacterial infections. Exposure to smoke. Exposure to air pollution, chemical fumes, or dust. Things that can cause an allergic reaction (allergens). Not taking your usual COPD medicines as directed. Underlying medical problems, such as congestive heart failure or infections not involving the lungs. In many cases, the cause of this condition is not known. What increases the risk? The following factors may make you more likely to develop this condition: Smoking cigarettes. Being an older adult. Having frequent prior COPD exacerbations. What are the signs or symptoms? Symptoms of this condition include: Increased coughing. Increased production of mucus from your lungs. Increased wheezing and shortness of breath. Rapid or labored breathing. Chest tightness. Less energy than usual. Sleep disruption from symptoms. Confusion Increased sleepiness. Often, these symptoms happen or get worse even with the use of medicines. How is this diagnosed? This condition is diagnosed based on: Your medical history. A physical exam. You may also have tests, including: A chest X-ray. Blood tests. Lung (pulmonary) function tests. How is this treated? Treatment for this  condition depends on the severity and cause of the symptoms. You may need to be admitted to a hospital for treatment. Some of the treatments commonly used to treat COPD exacerbations are: Antibiotic medicines. These may be used for severe exacerbations caused by a lung infection, such as pneumonia. Bronchodilators. These are inhaled medicines that expand the air passages and allow increased airflow. They may make your breathing more comfortable. Steroid medicines. These act to reduce inflammation in the airways. They may be given with an inhaler, taken by mouth, or given through an IV tube inserted into one of your veins. Supplemental oxygen therapy. Airway clearing techniques, such as noninvasive ventilation (NIV) and positive expiratory pressure (PEP). These provide respiratory support through a mask or other noninvasive device. An example of this would be using a continuous positive airway pressure (CPAP) machine to improve delivery of oxygen into your lungs. Follow these instructions at home: Medicines Take over-the-counter and prescription medicines only as told by your health care provider. It is important to use correct technique with inhaled medicines. If you were prescribed an antibiotic medicine or oral steroid, take it as told by your health care provider. Do not stop taking the medicine even if you start to feel better. Lifestyle Do not use any products that contain nicotine or tobacco. These products include cigarettes, chewing tobacco, and vaping devices, such as e-cigarettes. If you need help quitting, ask your health care provider. Eat a healthy diet. Exercise regularly. Get enough sleep. Most adults need 7 or more hours per night. Avoid exposure to all substances that irritate the airway, especially tobacco smoke. Regularly wash your hands with soap and water for at least 20 seconds. If soap and water are not available, use hand sanitizer. This may help prevent you from getting  infections. During flu season, avoid enclosed spaces that are crowded with people.  General instructions Drink enough fluid to keep your urine pale yellow, unless you have a medical condition that requires fluid restriction. Use a cool mist vaporizer. This humidifies the air and makes it easier for you to clear your chest when you cough. If you have a home nebulizer and oxygen, continue to use them as told by your health care provider. Keep all follow-up visits. This is important. How is this prevented? Stay up-to-date on pneumococcal and flu (influenza) vaccines. A flu shot is recommended every year to help prevent exacerbations. Quitting smoking is very important in preventing COPD from getting worse and in preventing exacerbations from happening as often. Follow all instructions for pulmonary rehabilitation after a recent exacerbation. This can help prevent future exacerbations. Work with your health care provider to develop and follow an action plan. This tells you what steps to take when you experience certain symptoms. Contact a health care provider if: You have a worsening of your regular COPD symptoms. Get help right away if: You have worsening shortness of breath, even when resting. You have trouble talking. You have severe chest pain. You cough up blood. You have a fever. You have weakness, vomit repeatedly, or faint. You feel confused. You are not able to sleep because of your symptoms. You have trouble doing daily activities. These symptoms may represent a serious problem that is an emergency. Do not wait to see if the symptoms will go away. Get medical help right away. Call your local emergency services (911 in the [place] Do not drive yourself to the hospital. Summary COPD exacerbations are episodes when breathing symptoms become much worse and require extra treatment above your normal treatment. Exacerbations can be severe and even life threatening. Frequent COPD exacerbations  can cause further damage to your lungs. COPD exacerbations are usually triggered by infections such as the flu, colds, and even pneumonia. Treatment for this condition depends on the severity and cause of the symptoms. You may need to be admitted to a hospital for treatment. Quitting smoking is very important to prevent COPD from getting worse and to prevent exacerbations from happening as often. This information is not intended to replace advice given to you by your health care provider. Make sure you discuss any questions you have with your health care provider. Document Revised: 01/31/2020 Document Reviewed: 01/31/2020 Elsevier Patient Education  [place].

## 2022-05-12 NOTE — Progress Notes (Signed)
Subjective:     Patient ID: John [name], male    DOB: Jul 14, 1945, 77 y.o.   MRN: [id number]  Chief Complaint  Patient presents with   Nasal Congestion   Cough   PT presents to the office today with cough that started two weeks ago. Has COPD. He continues to smoke 1/2 pack. Has home oxygen and only uses as needed.  Cough This is a recurrent problem. The current episode started 1 to 4 weeks ago. The problem has been gradually worsening. The problem occurs every few minutes. The cough is Productive of sputum. Associated symptoms include ear congestion, headaches, nasal congestion, shortness of breath and wheezing. Pertinent negatives include no chills, ear pain, fever or myalgias. Risk factors for lung disease include smoking/tobacco exposure. He has tried rest for the symptoms. The treatment provided mild relief. His past medical history is significant for COPD.  Gastroesophageal Reflux He complains of belching, coughing and wheezing. This is a chronic problem. The current episode started more than 1 year ago. The problem occurs occasionally. He has tried a PPI for the symptoms. The treatment provided moderate relief.  Diabetes He presents for his follow-up diabetic visit. He has type 2 diabetes mellitus. Hypoglycemia symptoms include headaches. Risk factors for coronary artery disease include dyslipidemia, diabetes mellitus, hypertension, male sex and sedentary lifestyle. He is following a generally unhealthy diet. His overall blood glucose range is 140-180 mg/dl.      Review of Systems  Constitutional:  Negative for chills and fever.  HENT:  Negative for ear pain.   Respiratory:  Positive for cough, shortness of breath and wheezing.   Musculoskeletal:  Negative for myalgias.  Neurological:  Positive for headaches.  All other systems reviewed and are negative.      Objective:   Physical Exam Vitals reviewed.  Constitutional:      General: He is not in acute distress.     Appearance: He is well-developed. He is obese.  HENT:     Head: Normocephalic.     Right Ear: External ear normal.     Left Ear: External ear normal.  Eyes:     General:        Right eye: No discharge.        Left eye: No discharge.     Pupils: Pupils are equal, round, and reactive to light.  Neck:     Thyroid: No thyromegaly.  Cardiovascular:     Rate and Rhythm: Normal rate and regular rhythm.     Heart sounds: Normal heart sounds. No murmur heard. Pulmonary:     Effort: Pulmonary effort is normal. No respiratory distress.     Breath sounds: Wheezing present.  Abdominal:     General: Bowel sounds are normal. There is no distension.     Palpations: Abdomen is soft.     Tenderness: There is no abdominal tenderness.  Musculoskeletal:        General: No tenderness. Normal range of motion.     Cervical back: Normal range of motion and neck supple.  Skin:    General: Skin is warm and dry.     Findings: No erythema or rash.  Neurological:     Mental Status: He is alert and oriented to person, place, and time.     Cranial Nerves: No cranial nerve deficit.     Deep Tendon Reflexes: Reflexes are normal and symmetric.  Psychiatric:        Behavior: Behavior normal.  Thought Content: Thought content normal.        Judgment: Judgment normal.       BP 124/78   Pulse 98   Temp (!) 97.3 F (36.3 C) (Temporal)   Ht 5\' 6"  (1.676 m)   Wt 154 lb 9.6 oz (70.1 kg)   SpO2 98%   BMI 24.95 kg/m      Assessment & Plan:  JADIER ROCKERS comes in today with chief complaint of Nasal Congestion and Cough   Diagnosis and orders addressed:  1. COPD exacerbation ([place]) Start prednisone - Take meds as prescribed - Use a cool mist humidifier  -Use saline nose sprays frequently -Force fluids -For any cough or congestion  Use plain Mucinex- regular strength or max strength is fine -For fever or aces or pains- take tylenol or ibuprofen. -Throat lozenges if help - predniSONE  (STERAPRED UNI-PAK 21 TAB) 10 MG (21) TBPK tablet; Use as directed  Dispense: 21 tablet; Refill: 0  2. GERD without esophagitis Will restart Protonix but to 20 mg daily from 40 mg BID -Diet discussed- Avoid fried, spicy, citrus foods, caffeine and alcohol -Do not eat 2-3 hours before bedtime -Encouraged small frequent meals -Avoid NSAID's - pantoprazole (PROTONIX) 20 MG tablet; Take 1 tablet (20 mg total) by mouth daily.  Dispense: 90 tablet; Refill: 1  3. Type 2 diabetes mellitus with diabetic polyneuropathy, without long-term current use of insulin (HCC) Strict low carb diet while taking prednisone     Follow up plan: Keep chronic follow up  Evelina Dun, FNP

## 2022-05-15 ENCOUNTER — Encounter (HOSPITAL_COMMUNITY)
Admission: RE | Admit: 2022-05-15 | Discharge: 2022-05-15 | Disposition: A | Discharge status: Home / Self Care | Payer: Medicare Other | Source: Ambulatory Visit | Attending: Hematology | Admitting: Hematology

## 2022-05-15 DIAGNOSIS — I2510 Atherosclerotic heart disease of native coronary artery without angina pectoris: Secondary | ICD-10-CM | POA: Diagnosis not present

## 2022-05-15 DIAGNOSIS — C3490 Malignant neoplasm of unspecified part of unspecified bronchus or lung: Secondary | ICD-10-CM | POA: Diagnosis not present

## 2022-05-15 DIAGNOSIS — K5730 Diverticulosis of large intestine without perforation or abscess without bleeding: Secondary | ICD-10-CM | POA: Diagnosis not present

## 2022-05-15 DIAGNOSIS — C3411 Malignant neoplasm of upper lobe, right bronchus or lung: Secondary | ICD-10-CM | POA: Insufficient documentation

## 2022-05-15 DIAGNOSIS — C3491 Malignant neoplasm of unspecified part of right bronchus or lung: Secondary | ICD-10-CM | POA: Insufficient documentation

## 2022-05-15 DIAGNOSIS — J432 Centrilobular emphysema: Secondary | ICD-10-CM | POA: Diagnosis not present

## 2022-05-15 MED ORDER — FLUDEOXYGLUCOSE F - 18 (FDG) INJECTION
8.3550 | Freq: Once | INTRAVENOUS | Status: AC | PRN
  Administered 2022-05-15: 8.355 via INTRAVENOUS

## 2022-05-19 ENCOUNTER — Other Ambulatory Visit: Payer: Self-pay | Admitting: Cardiovascular Disease

## 2022-05-21 ENCOUNTER — Inpatient Hospital Stay: Payer: Medicare Other | Attending: Hematology | Admitting: Hematology

## 2022-05-21 ENCOUNTER — Encounter: Payer: Self-pay | Admitting: Hematology

## 2022-05-21 VITALS — BP 131/61 | HR 99 | Temp 97.6°F | Resp 18 | Wt 151.2 lb

## 2022-05-21 DIAGNOSIS — Z79899 Other long term (current) drug therapy: Secondary | ICD-10-CM | POA: Diagnosis not present

## 2022-05-21 DIAGNOSIS — E039 Hypothyroidism, unspecified: Secondary | ICD-10-CM | POA: Insufficient documentation

## 2022-05-21 DIAGNOSIS — Z85118 Personal history of other malignant neoplasm of bronchus and lung: Secondary | ICD-10-CM | POA: Diagnosis not present

## 2022-05-21 DIAGNOSIS — D539 Nutritional anemia, unspecified: Secondary | ICD-10-CM | POA: Diagnosis not present

## 2022-05-21 DIAGNOSIS — Z9221 Personal history of antineoplastic chemotherapy: Secondary | ICD-10-CM | POA: Diagnosis not present

## 2022-05-21 DIAGNOSIS — R918 Other nonspecific abnormal finding of lung field: Secondary | ICD-10-CM | POA: Diagnosis not present

## 2022-05-21 DIAGNOSIS — C3491 Malignant neoplasm of unspecified part of right bronchus or lung: Secondary | ICD-10-CM

## 2022-05-21 NOTE — Progress Notes (Signed)
[facility] [address] [place] [address]    Clinic Day:  05/21/2022  Referring physician: [name], FNP  Patient Care Team: [name], FNP as PCP - General (Family Medicine) O'Neal, Cassie Freer, MD as PCP - Cardiology (Cardiology) Vickie Epley, MD as PCP - Electrophysiology (Cardiology) Derek Jack, MD as Consulting Physician (Hematology) Lavera Guise, [facility] (Pharmacist) O'Neal, Cassie Freer, MD as Consulting Physician (Cardiology) McKenzie, Candee Furbish, MD as Consulting Physician (Urology) Celestia Khat, [place] (Optometry) Brita Romp, NP as Nurse Practitioner (Endocrinology) Steffanie Rainwater, DPM as Consulting Physician (Podiatry) Marty Heck, MD as Consulting Physician (Vascular Surgery)   ASSESSMENT & PLAN:   Assessment: 1.  Stage IIIa (T1b N2 cM0) NSCLC, favoring adenocarcinoma: -Chemo XRT with weekly carboplatin and paclitaxel completed on 08/20/2016. -Consolidation durvalumab from 09/26/2016 through 09/25/2017. -Reviewed CT chest on 11/07/2019 which showed increasing size of the upper lobe pulmonary nodules with no new nodule in the left upper lobe suspicious for meta stasis.  Also increasing confluent soft tissue in the right suprahilar region, thought to be postradiation changes. -PET scan on 11/21/2019 showed suspected radiation changes in the right paramediastinal/perihilar region although mild hypermetabolism is present in the right retroareolar region.SUV 2.5.  While progressive soft tissue is present in this region, this hypermetabolism remains nonfocal and favors radiation changes.  7 mm central left upper lobe nodule max SUV 1.4.  9 mm aggregate nodule in the lateral right upper lobe max SUV 2.4.  These are minimally progressive from more remote prior scans.  6 mm left lower lobe nodule unchanged.  No hypermetabolic thoracic adenopathy. -CT chest on 03/23/2020 showed many of the lung nodules are stable.  Couple  of nodules have grown by few millimeters.  No new areas seen. - SBRT to the right and left lung lesions, 54 Gray in 3 fractions from 09/10/2020 through 09/17/2020.   2.  Normocytic to macrocytic anemia: - Found to have hemoglobin 6.3 on 10/09/2020 and received 2 units PRBC while hospitalized.  I have reviewed hospitalization records. - EGD on 09/06/2020 with tiny gastric ulcer without bleeding or stigmata. - Blood loss this year most likely postoperative in nature (urological surgery in February and vascular surgery in May).  Plan: 1.  Stage IIIa (T1b N2 cM0) NSCLC, favoring adenocarcinoma: - CT chest from 04/25/2022 showed solid pulmonary nodules in the right upper lobe 1.1 cm and left upper lobe nodule 7 mm, previously 5 mm both increasing in size.  No clear adenopathy. - We reviewed images of the PET scan from 09/13/6293: Hypermetabolic 1.2 cm right upper lobe nodule increased in size from 0.5 centimeter from 06/19/2021 chest CT.  Low-level FDG uptake associated with the solid 0.8 cm left upper lobe lung nodule increased in size from 0.4 cm on 06/19/2021.  Low-level FDG uptake associated with 1.7 cm nodular component at the site of postradiation change in the left upper lobe increased from 1.4 cm equivocal.  No adenopathy or metastatic disease. - I have recommended that he be seen by Dr. Sondra Come for any potential radiation. - Recommend follow-up in 4 months with repeat CT chest.   2.  Hypothyroidism: - Continue Synthroid daily.   3.  Difficulty staying asleep: - Continue temazepam as needed.  4.  Normocytic to macrocytic anemia: - Continue iron tablet daily.    No orders of the defined types were placed in this encounter.     John [name],acting as a Education administrator for [employer],  MD.,have documented all relevant documentation on the behalf of Derek Jack, MD,as directed by  Derek Jack, MD while in the presence of Derek Jack, MD.   I, Derek Jack MD,  have reviewed the above documentation for accuracy and completeness, and I agree with the above.   Doyce Loose   2/14/20248:14 AM  CHIEF COMPLAINT:   Diagnosis: right NSCLC    Cancer Staging  Non-small cell lung cancer, right [facility]) Staging form: Lung, AJCC 8th Edition - Clinical stage from 06/05/2016: Stage IIIA (cT1b(2), cN2, cM0) - Signed by Baird Cancer, PA-C on 08/21/2016    Prior Therapy:  1. Chemoradiation with weekly carboplatin and paclitaxel from 06/25/2016 to 08/20/2016. 2. Consolidation durvalumab from 09/26/2016 to 09/25/2017.  Current Therapy:  surveillance     HISTORY OF PRESENT ILLNESS:   Oncology History  Non-small cell lung cancer, right ([place])  04/17/2016 Imaging   CT chest- 1. Examination is positive for bilateral upper lobe pulmonary lesions suspicious for malignancy. Further investigation with PET-CT and tissue sampling is advised. 2. Borderline enlarged paratracheal lymph nodes and right hilar nodes. Cannot rule out metastatic adenopathy. Attention to these areas on PET-CT is advised. 3. Emphysema 4. Aortic atherosclerosis and multi vessel coronary artery calcification.   04/18/2016 Initial Diagnosis   Adenocarcinoma of lung, right ([place])   04/24/2016 PET scan   1. Adjacent hypermetabolic nodules in the RIGHT upper lobe consistent bronchogenic carcinoma. 2. Suspicion of ipsilateral hilar and paratracheal nodal metastasis. 3. Nodule in the LEFT upper lobe with suspicious morphology has a relatively low metabolic activity for size. Favor synchronous primary bronchogenic carcinoma. 4. No metabolic activity associated with a small LEFT lower lobe pulmonary nodule. Recommend attention on follow-up.   05/06/2016 Imaging   MRI brain- Negative for metastatic disease.   05/28/2016 Procedure   1. Electromagnetic navigational bronchoscopy with brushings, needle     aspirations and biopsies of bilateral upper lobe nodules. 2. Endobronchial  ultrasound with mediastinal and hilar lymph node     aspirations. By Dr. [name]   06/03/2016 Pathology Results   Diagnosis 1. Lung, biopsy, Right upper lobe - BLOOD AND BENIGN BRONCHIAL AND LUNG TISSUE. 2. Lung, biopsy, Left upper lobe - BLOOD AND BENIGN BRONCHIAL AND LUNG TISSUE.   06/03/2016 Pathology Results   Diagnosis TRANSBRONCHIAL NEEDLE ASPIRATION, NAVIGATION, RUL (SPECIMEN 5 OF 7, COLLECTED 05/28/16): MALIGNANT CELLS CONSISTENT WITH NON-SMALL CELL CARCINOMA Comment There are limited tumor cells in the cell block (insufficient for molecular testing). TTF-1 is positive in a few of the atypical cells. They appear negative for synaptophysin and cytokeratin 5/6. Overall there is limited tumor, but a lung adenocarcinoma is slightly favored. Dr. [name] has reviewed the case. The case was called to Dr. [name] on 06/03/2016.   06/13/2016 Procedure   Port placed by Dr. Arnoldo Morale   06/25/2016 - 08/15/2016 Chemotherapy   The patient had palonosetron (ALOXI) injection 0.25 mg, 0.25 mg, Intravenous,  Once, 6 of 6 cycles  CARBOplatin (PARAPLATIN) 220 mg in sodium chloride 0.9 % 250 mL chemo infusion, 220 mg (100 % of original dose 215.8 mg), Intravenous,  Once, 6 of 6 cycles Dose modification:   (original dose 215.8 mg, Cycle 1),   (original dose 215.8 mg, Cycle 5),   (original dose 215.8 mg, Cycle 6),   (original dose 215.8 mg, Cycle 2),   (original dose 215.8 mg, Cycle 3),   (original dose 215.8 mg, Cycle 4)  PACLitaxel (TAXOL) 90 mg in dextrose 5 % 250 mL chemo infusion ( for  chemotherapy treatment.     06/25/2016 - 08/20/2016 Radiation Therapy   Radiation in [place], [place] by Dr. Quitman Livings.  R lung to 6600 cGy   07/09/2016 - 07/10/2016 Hospital Admission   Admit date: 07/09/2016  Admission diagnosis: Fever and chills Additional comments: Negative work-up, discharged with course of Levaquin   07/09/2016 Treatment Plan Change   Treatment deferred x 1 week due to hospitalization    08/05/2016  - 08/06/2016 Hospital Admission   Admit date: 08/05/2016 Admission diagnosis: Joint pain Additional comments: Treated with steroids with symptomatic improvement   08/25/2016 Imaging   MRI brain- Negative for intracranial metastasis on this motion degraded examination.  Stable mild-to-moderate chronic small vessel ischemic disease.   09/18/2016 PET scan   1. Partial metabolic response. Right upper lobe 2.6 cm hypermetabolic pulmonary nodule, decreased in size and metabolism. Mildly hypermetabolic right hilar nodal metastasis, decreased in metabolism. Right paratracheal lymph node is no longer hypermetabolic. No new or progressive metastatic disease. 2. Apical left upper lobe 0.9 cm solid pulmonary nodule, slightly decreased in size, with no significant metabolism. The change in size could indicate a neoplastic etiology for this nodule. 3. Additional subcentimeter pulmonary nodules in the left lung are stable and below PET resolution . 4. Additional findings include aortic atherosclerosis, coronary atherosclerosis, moderate emphysema, stable small pericardial effusion/thickening and mild sigmoid diverticulosis.   01/13/2017 Imaging   CT chest with contrast IMPRESSION: 1. No change in size of the hypermetabolic nodular thickening in the RIGHT upper lobe. 2. LEFT upper lobe spiculated nodule is stable. 3. Rounded LEFT lower lobe pulmonary nodule is stable. 4. No evidence of lung cancer progression.   05/08/2017 Adverse Reaction   Development of Hypothyroidism- likely secondary to immunotherapy.  Levothyroxine therapy initiated.       INTERVAL HISTORY:   John [name] is a 77 y.o. male presenting to clinic today for follow up of right NSCLC. He was last seen by me on 05/05/2022.  Today, he states that he is doing well overall. His appetite level is at 100%. His energy level is at 75%. He states that he has no issues since his last visit.     PAST MEDICAL HISTORY:   Past Medical  History: Past Medical History:  Diagnosis Date   Adenocarcinoma of lung, right ([place]) 04/18/2016   Anemia    Anxiety    Arthritis    Asthma    Atrial flutter (HCC)    BPH (benign prostatic hyperplasia)    with urinary retention 02/06/20   CHF (congestive heart failure) (HCC)    COPD (chronic obstructive pulmonary disease) (HCC)    Depression    Diabetes mellitus, type II ([place])    Dyspnea    History of kidney stones    History of radiation therapy    right lung 09/10/2020-09/17/2020   Dr Sondra Come   Hyperlipidemia    Hypertension    Hypothyroidism    Macular degeneration    Neuropathy    Non-small cell lung cancer, right ([place]) 04/18/2016   Peripheral vascular disease ([place])    Prostatitis    Pulmonary nodule, left 07/16/2016   Sleep apnea    cpap    Surgical History: Past Surgical History:  Procedure Laterality Date   A-FLUTTER ABLATION N/A 01/13/2022   Procedure: A-FLUTTER ABLATION;  Surgeon: Vickie Epley, MD;  Location: [facility];  Service: Cardiovascular;  Laterality: N/A;   ABDOMINAL AORTOGRAM W/LOWER EXTREMITY Left 02/06/2020   Procedure: ABDOMINAL AORTOGRAM W/LOWER EXTREMITY;  Surgeon: Lorretta Harp,  MD;  Location: [place] [facility];  Service: Cardiovascular;  Laterality: Left;   ABDOMINAL AORTOGRAM W/LOWER EXTREMITY N/A 05/30/2021   Procedure: ABDOMINAL AORTOGRAM W/LOWER EXTREMITY;  Surgeon: Marty Heck, MD;  Location: [place] [facility];  Service: Cardiovascular;  Laterality: N/A;   BIOPSY  09/16/2021   Procedure: BIOPSY;  Surgeon: Gatha Mayer, MD;  Location: [facility];  Service: Gastroenterology;;   CARDIOVERSION N/A 10/05/2020   Procedure: CARDIOVERSION;  Surgeon: Geralynn Rile, MD;  Location: [place];  Service: Cardiovascular;  Laterality: N/A;   CATARACT EXTRACTION, BILATERAL Bilateral    COLONOSCOPY WITH PROPOFOL N/A 06/20/2021   Procedure: COLONOSCOPY WITH PROPOFOL;  Surgeon: Irene Shipper, MD;  Location: [facility];   Service: Endoscopy;  Laterality: N/A;   COLONOSCOPY WITH PROPOFOL N/A 09/16/2021   Procedure: COLONOSCOPY WITH PROPOFOL;  Surgeon: Gatha Mayer, MD;  Location: [facility];  Service: Gastroenterology;  Laterality: N/A;   ENDARTERECTOMY FEMORAL Right 08/20/2020   Procedure: ENDARTERECTOMY  RIGHT FEMORAL ARTERY;  Surgeon: Marty Heck, MD;  Location: [facility];  Service: Vascular;  Laterality: Right;   ENTEROSCOPY N/A 10/11/2021   Procedure: ENTEROSCOPY;  Surgeon: Harvel Quale, MD;  [facility];  Service: Gastroenterology;  Laterality: N/A;   ESOPHAGOGASTRODUODENOSCOPY N/A 09/16/2021   Procedure: ESOPHAGOGASTRODUODENOSCOPY (EGD);  Surgeon: Gatha Mayer, MD;  Location: [facility];  Service: Gastroenterology;  Laterality: N/A;   ESOPHAGOGASTRODUODENOSCOPY (EGD) WITH PROPOFOL N/A 09/06/2020   Procedure: ESOPHAGOGASTRODUODENOSCOPY (EGD) WITH PROPOFOL;  Surgeon: Irene Shipper, MD;  Location: [facility];  Service: Endoscopy;  Laterality: N/A;   ESOPHAGOGASTRODUODENOSCOPY (EGD) WITH PROPOFOL N/A 10/11/2021   Procedure: ESOPHAGOGASTRODUODENOSCOPY (EGD) WITH PROPOFOL;  Surgeon: Harvel Quale, MD;  [facility];  Service: Gastroenterology;  Laterality: N/A;   HEMOSTASIS CLIP PLACEMENT  09/16/2021   Procedure: HEMOSTASIS CLIP PLACEMENT;  Surgeon: Gatha Mayer, MD;  Location: [name];  Service: Gastroenterology;;   HOT HEMOSTASIS N/A 09/16/2021   Procedure: HOT HEMOSTASIS (ARGON PLASMA COAGULATION/BICAP);  Surgeon: Gatha Mayer, MD;  Location: [place];  Service: Gastroenterology;  Laterality: N/A;   HOT HEMOSTASIS  10/11/2021   Procedure: HOT HEMOSTASIS (ARGON PLASMA COAGULATION/BICAP);  Surgeon: Montez Morita, Quillian Quince, MD;  [facility];  Service: Gastroenterology;;   INSERTION OF ILIAC STENT Right 08/20/2020   Procedure: RETROGRADE INSERTION OF RIGHT ILIAC STENT;  Surgeon: Marty Heck, MD;  Location: [place];  Service:  Vascular;  Laterality: Right;   INTRAOPERATIVE ARTERIOGRAM Right 08/20/2020   Procedure: INTRA OPERATIVE ARTERIOGRAM ILIAC;  Surgeon: Marty Heck, MD;  Location: [place];  Service: Vascular;  Laterality: Right;   PATCH ANGIOPLASTY Right 08/20/2020   Procedure: PATCH ANGIOPLASTY RIGHT FEMORAL ARTERY;  Surgeon: Marty Heck, MD;  Location: [place];  Service: Vascular;  Laterality: Right;   PERIPHERAL VASCULAR BALLOON ANGIOPLASTY Right 05/30/2021   Procedure: PERIPHERAL VASCULAR BALLOON ANGIOPLASTY;  Surgeon: Marty Heck, MD;  Location: [place] [facility];  Service: Cardiovascular;  Laterality: Right;   PORTACATH PLACEMENT Left 06/13/2016   Procedure: INSERTION PORT-A-CATH;  Surgeon: Aviva Signs, MD;  [facility];  Service: General;  Laterality: Left;   TRANSURETHRAL RESECTION OF PROSTATE N/A 05/31/2020   Procedure: TRANSURETHRAL RESECTION OF THE PROSTATE (TURP);  Surgeon: Cleon Gustin, MD;  [facility];  Service: Urology;  Laterality: N/A;   VIDEO BRONCHOSCOPY WITH ENDOBRONCHIAL NAVIGATION N/A 05/28/2016   Procedure: VIDEO BRONCHOSCOPY WITH ENDOBRONCHIAL NAVIGATION;  Surgeon: [name], MD;  Location: [place];  Service: Thoracic;  Laterality: N/A;   VIDEO BRONCHOSCOPY WITH ENDOBRONCHIAL ULTRASOUND N/A 05/28/2016   Procedure: VIDEO BRONCHOSCOPY WITH ENDOBRONCHIAL ULTRASOUND;  Surgeon: [name], MD;  Location: [facility];  Service: Thoracic;  Laterality: N/A;    Social History: Social History   Socioeconomic History   Marital status: Soil scientist    Spouse name: Not on file   Number of children: 5   Years of education: 10   Highest education level: 10th grade  Occupational History   Occupation: Retired  Tobacco Use   Smoking status: Every Day    Packs/day: 0.50    Years: 55.00    Total pack years: 27.50    Types: Cigarettes    Start date: 03/11/1961   Smokeless tobacco: Never   Tobacco comments:    1/2 pack to 1 pack per day 12/14/19   Vaping Use   Vaping Use: Never used  Substance and Sexual Activity   Alcohol use: No   Drug use: No   Sexual activity: Yes    Birth control/protection: None  Other Topics Concern   Not on file  Social History Narrative   [name] is his POA and lives with him. Children out of town - one in [facility], others in [place].   Social Determinants of Health   Financial Resource Strain: Low Risk  (11/15/2021)   Overall Financial Resource Strain (CARDIA)    Difficulty of Paying Living Expenses: Not hard at all  Food Insecurity: No Food Insecurity (11/15/2021)   Hunger Vital Sign    Worried About Running Out of Food in the Last Year: Never true    Ran Out of Food in the Last Year: Never true  Transportation Needs: No Transportation Needs (11/15/2021)   PRAPARE - Hydrologist (Medical): No    Lack of Transportation (Non-Medical): No  Physical Activity: Inactive (11/15/2021)   Exercise Vital Sign    Days of Exercise per Week: 0 days    Minutes of Exercise per Session: 0 min  Stress: No Stress Concern Present (11/15/2021)   [place]    Feeling of Stress : Only a little  Social Connections: Moderately Isolated (11/15/2021)   Social Connection and Isolation Panel [NHANES]    Frequency of Communication with Friends and Family: More than three times a week    Frequency of Social Gatherings with Friends and Family: Once a week    Attends Religious Services: Never    Marine scientist or Organizations: No    Attends Archivist Meetings: Never    Marital Status: Living with partner  Intimate Partner Violence: Not At Risk (11/15/2021)   Humiliation, Afraid, Rape, and Kick questionnaire    Fear of Current or Ex-Partner: No    Emotionally Abused: No    Physically Abused: No    Sexually Abused: No    Family History: Family History  Problem Relation Age of Onset   Hypertension Mother     Diabetes Father    Heart disease Father    Stroke Father    Hypertension Sister     Current Medications:  Current Outpatient Medications:    aspirin EC 81 MG tablet, Take 1 tablet (81 mg total) by mouth daily. Swallow whole., Disp: 90 tablet, Rfl: 3   Blood Glucose Monitoring Suppl (ONETOUCH VERIO REFLECT) w/Device KIT, Use to test blood sugars daily as directed. DX: E11.9, Disp: 1 kit, Rfl: 1  colchicine 0.6 MG tablet, Take 2 tabs immediately, then 1 tab twice per day for the duration of the flare up to a max of 7 days, Disp: 21 tablet, Rfl: 0   [employer] IRON 325 (65 Fe) MG tablet, TAKE 1 TABLET (325MG ) TWICE DAILY, Disp: 200 tablet, Rfl: 1   dutasteride (AVODART) 0.5 MG capsule, Take 1 capsule (0.5 mg total) by mouth daily., Disp: 90 capsule, Rfl: 3   ezetimibe (ZETIA) 10 MG tablet, TAKE 1 TABLET BY MOUTH EVERY DAY, Disp: 90 tablet, Rfl: 3   famotidine (PEPCID) 20 MG tablet, One after supper (Patient taking differently: Take 20 mg by mouth daily with supper.), Disp: 30 tablet, Rfl: 11   fluticasone (FLONASE) 50 MCG/ACT nasal spray, PLACE 1 SPRAY INTO BOTH NOSTRILS DAILY AS NEEDED FOR ALLERGIES OR RHINITIS., Disp: 48 mL, Rfl: 3   Fluticasone-Umeclidin-Vilant (TRELEGY ELLIPTA) 100-62.5-25 MCG/ACT AEPB, Inhale 1 puff into the lungs daily., Disp: 1 each, Rfl: 11   gabapentin (NEURONTIN) 300 MG capsule, TAKE 1 CAPSULE BY MOUTH THREE TIMES A DAY, Disp: 270 capsule, Rfl: 0   KLOR-CON M20 20 MEQ tablet, TAKE 1 TABLET BY MOUTH EVERY DAY, Disp: 90 tablet, Rfl: 0   levothyroxine (SYNTHROID) 50 MCG tablet, TAKE 1 TABLET BY MOUTH EVERY DAY, Disp: 90 tablet, Rfl: 1   linaclotide (LINZESS) 72 MCG capsule, Take 1 capsule (72 mcg total) by mouth daily as needed (constipation)., Disp: , Rfl:    metolazone (ZAROXOLYN) 2.5 MG tablet, TAKE 1 TABLET BY MOUTH ON MONDAY, WEDNESDAY AND FRIDAY, Disp: 45 tablet, Rfl: 1   metoprolol succinate (TOPROL XL) 25 MG 24 hr tablet, Take 0.5 tablets (12.5 mg total) by mouth  daily. (Patient not taking: Reported on 05/12/2022), Disp: 45 tablet, Rfl: 3   NON FORMULARY, CPAP at bedtime, Disp: , Rfl:    ondansetron (ZOFRAN) 4 MG tablet, TAKE 1 TABLET BY MOUTH EVERY 8 HOURS AS NEEDED FOR NAUSEA AND VOMITING, Disp: 60 tablet, Rfl: 2   OneTouch Delica Lancets 77A MISC, Use to test blood sugars daily as directed. DX: E11.9, Disp: 100 each, Rfl: 1   ONETOUCH VERIO test strip, USE TO TEST BLOOD SUGARS DAILY AS DIRECTED. DX: E11.9, Disp: 100 strip, Rfl: 3   OXYGEN, Inhale 2 L into the lungs daily as needed (with Cpap at night)., Disp: , Rfl:    pantoprazole (PROTONIX) 20 MG tablet, Take 1 tablet (20 mg total) by mouth daily., Disp: 90 tablet, Rfl: 1   pravastatin (PRAVACHOL) 40 MG tablet, TAKE 1 TABLET (40 MG TOTAL) BY MOUTH ON MONDAYS , WEDNESDAYS, AND FRIDAYS AS DIRECTED, Disp: 48 tablet, Rfl: 0   predniSONE (STERAPRED UNI-PAK 21 TAB) 10 MG (21) TBPK tablet, Use as directed, Disp: 21 tablet, Rfl: 0   Semaglutide (RYBELSUS) 14 MG TABS, Take 1 tablet (14 mg total) by mouth daily., Disp: 30 tablet, Rfl: 3   spironolactone (ALDACTONE) 25 MG tablet, Take 1 tablet (25 mg total) by mouth daily., Disp: 90 tablet, Rfl: 3   torsemide (DEMADEX) 20 MG tablet, Take 1 tablet (20 mg total) by mouth daily., Disp: 90 tablet, Rfl: 2   zolpidem (AMBIEN) 5 MG tablet, Take 1 tablet (5 mg total) by mouth at bedtime as needed for sleep., Disp: 30 tablet, Rfl: 2   Allergies: Allergies  Allergen Reactions   Sulfa Antibiotics Swelling    Mouth swelling   Atorvastatin Other (See Comments)    Muscle aches - tolerating Pravastatin 40 mg MWF   Jardiance [Empagliflozin] Other (See Comments)  FEELS SLUGGISH, TIRED   Lopressor [Metoprolol] Other (See Comments)    Fatigue   Rosuvastatin Other (See Comments)    Muscle aches - tolerating Pravastatin 40 mg MWF   Doxycycline Nausea Only   Temazepam Other (See Comments)    Made insomnia worse     REVIEW OF SYSTEMS:   Review of Systems   Constitutional:  Positive for fatigue. Negative for chills and fever.  HENT:   Negative for lump/mass, mouth sores, nosebleeds, sore throat and trouble swallowing.   Eyes:  Negative for eye problems.  Respiratory:  Positive for cough (COPD) and shortness of breath.   Cardiovascular:  Negative for chest pain, leg swelling and palpitations.  Gastrointestinal:  Negative for abdominal pain, constipation, diarrhea, nausea and vomiting.  Genitourinary:  Negative for bladder incontinence, difficulty urinating, dysuria, frequency, hematuria and nocturia.   Musculoskeletal:  Negative for arthralgias, back pain, flank pain, myalgias and neck pain.  Skin:  Negative for itching and rash.  Neurological:  Positive for numbness. Negative for dizziness and headaches.  Hematological:  Does not bruise/bleed easily.  Psychiatric/Behavioral:  Negative for depression, sleep disturbance and suicidal ideas. The patient is not nervous/anxious.   All other systems reviewed and are negative.    VITALS:   There were no vitals taken for this visit.  Wt Readings from Last 3 Encounters:  05/12/22 154 lb 9.6 oz (70.1 kg)  05/05/22 152 lb (68.9 kg)  05/02/22 151 lb (68.5 kg)    There is no height or weight on file to calculate BMI.  Performance status (ECOG): 1 - Symptomatic but completely ambulatory  PHYSICAL EXAM:   Physical Exam Vitals and nursing note reviewed. Exam conducted with a chaperone present.  Constitutional:      Appearance: Normal appearance.  Cardiovascular:     Rate and Rhythm: Normal rate and regular rhythm.     Pulses: Normal pulses.     Heart sounds: Normal heart sounds.  Pulmonary:     Effort: Pulmonary effort is normal.     Breath sounds: Normal breath sounds.  Abdominal:     Palpations: Abdomen is soft. There is no hepatomegaly, splenomegaly or mass.     Tenderness: There is no abdominal tenderness.  Musculoskeletal:     Right lower leg: No edema.     Left lower leg: No edema.   Lymphadenopathy:     Cervical: No cervical adenopathy.     Right cervical: No superficial, deep or posterior cervical adenopathy.    Left cervical: No superficial, deep or posterior cervical adenopathy.     Upper Body:     Right upper body: No supraclavicular or axillary adenopathy.     Left upper body: No supraclavicular or axillary adenopathy.  Neurological:     General: No focal deficit present.     Mental Status: He is alert and oriented to person, place, and time.  Psychiatric:        Mood and Affect: Mood normal.        Behavior: Behavior normal.     LABS:      Latest Ref Rng & Units 04/25/2022    9:58 AM 03/20/2022   12:55 PM 03/03/2022    4:44 PM  CBC  WBC 4.0 - 10.5 K/uL 11.0  9.2  16.6   Hemoglobin 13.0 - 17.0 g/dL 12.6  13.6  15.4   Hematocrit 39.0 - 52.0 % 38.1  39.4  43.5   Platelets 150 - 400 K/uL 297  413  447  Latest Ref Rng & Units 04/25/2022    9:58 AM 03/20/2022   12:55 PM 03/03/2022    4:44 PM  CMP  Glucose 70 - 99 mg/dL 202  136  219   BUN 8 - 23 mg/dL 21  20  40   Creatinine 0.61 - 1.24 mg/dL 0.99  1.12  1.64   Sodium 135 - 145 mmol/L 133  139  133   Potassium 3.5 - 5.1 mmol/L 4.4  5.2  4.9   Chloride 98 - 111 mmol/L 94  97  87   CO2 22 - 32 mmol/L 28  27  27    Calcium 8.9 - 10.3 mg/dL 9.1  9.6  9.9   Total Protein 6.5 - 8.1 g/dL 7.0  6.7  7.6   Total Bilirubin 0.3 - 1.2 mg/dL 0.5  <0.2  0.3   Alkaline Phos 38 - 126 U/L 111  144  163   AST 15 - 41 U/L 19  16  15    ALT 0 - 44 U/L 22  15  15       No results found for: "CEA1", "CEA" / No results found for: "CEA1", "CEA" No results found for: "PSA1" No results found for: "CAN199" No results found for: "CAN125"  Lab Results  Component Value Date   TOTALPROTELP 7.0 04/18/2021   ALBUMINELP 3.7 04/18/2021   A1GS 0.3 04/18/2021   A2GS 0.8 04/18/2021   BETS 1.2 04/18/2021   GAMS 1.0 04/18/2021   MSPIKE Not Observed 04/18/2021   SPEI Comment 04/18/2021   Lab Results  Component Value  Date   TIBC 440 04/25/2022   TIBC 404 02/21/2022   TIBC 440 12/24/2021   FERRITIN 40 04/25/2022   FERRITIN 100 02/21/2022   FERRITIN 74 12/24/2021   IRONPCTSAT 11 (L) 04/25/2022   IRONPCTSAT 13 (L) 02/21/2022   IRONPCTSAT 21 12/24/2021   Lab Results  Component Value Date   LDH 133 08/02/2018   LDH 126 03/08/2018   LDH 141 10/28/2017     STUDIES:   NM PET Image Restag (PS) Skull Base To Thigh  Result Date: 05/16/2022 CLINICAL DATA:  Subsequent treatment strategy for non-small cell lung cancer with enlarging pulmonary nodules. EXAM: NUCLEAR MEDICINE PET SKULL BASE TO THIGH TECHNIQUE: 8.4 mCi F-18 FDG was injected intravenously. Full-ring PET imaging was performed from the skull base to thigh after the radiotracer. CT data was obtained and used for attenuation correction and anatomic localization. Fasting blood glucose: 215 mg/dl COMPARISON:  04/25/2022 chest CT.  08/13/2020 PET-CT. FINDINGS: Mediastinal blood pool activity: SUV max 2.5 Liver activity: SUV max NA NECK: No hypermetabolic lymph nodes in the neck. Incidental CT findings: None. CHEST: No enlarged or hypermetabolic axillary, mediastinal or hilar lymph nodes. Mildly hypermetabolic solid 1.2 cm posterior right upper lobe pulmonary nodule with max SUV 2.9 (series 7/image 48), increased from 0.5 cm on 06/19/2021 chest CT. Solid 0.8 cm left upper lobe pulmonary nodule demonstrates low level FDG uptake with max SUV 1.5 (series 7/image 60), increased from 0.4 cm on 06/19/2021 chest CT. Generalized nonfocal mild FDG uptake associated with sharply marginated paramediastinal consolidation in the upper right lung with associated volume loss and distortion, compatible with radiation fibrosis. There is slightly increased discrete solid 1.7 cm nodular component at the site of sharply marginated consolidation in posterior left upper lobe with associated volume loss and distortion with associated low level FDG uptake with max SUV 2.4 (series 7/image  41), increased from 1.4 cm on 12/24/2021, equivocal for recurrent neoplasm  at the site of postradiation change. Additional solid apical left upper lobe 0.7 cm (series 7/image 36) and peripheral left lower lobe 0.5 cm (series 7/image 73) pulmonary nodules are below PET resolution and not definitely changed back to 06/19/2021 chest CT, more likely benign. Incidental CT findings: Left subclavian Port-A-Cath terminates in the middle third of the SVC. Moderate centrilobular emphysema. Coronary atherosclerosis. Atherosclerotic nonaneurysmal thoracic aorta. Subcutaneous loop recorder in the medial ventral left chest wall. ABDOMEN/PELVIS: No abnormal hypermetabolic activity within the liver, pancreas, adrenal glands, or spleen. No hypermetabolic lymph nodes in the abdomen or pelvis. Incidental CT findings: Atherosclerotic nonaneurysmal abdominal aorta. Simple 2.4 cm posterior interpolar right renal cyst, for which no follow-up imaging is recommended. Moderate colonic diverticulosis. Post TURP changes. SKELETON: No focal hypermetabolic activity to suggest skeletal metastasis. Incidental CT findings: None. IMPRESSION: 1. Mildly hypermetabolic solid 1.2 cm posterior right upper lobe pulmonary nodule, increased in size from 0.5 cm on 06/19/2021 chest CT, compatible with enlarging pulmonary metastasis. 2. Low level FDG uptake associated with a solid 0.8 cm left upper lobe pulmonary nodule, increased in size from 0.4 cm on 06/19/2021 chest CT, also suspicious for enlarging pulmonary metastasis. 3. Low level FDG uptake associated with slightly increased discrete solid 1.7 cm nodular component at the site of postradiation change in the left upper lobe, increased from 1.4 cm on 12/24/2021, equivocal for recurrent malignancy. Suggest attention on follow-up chest CT in 3 months. 4. No hypermetabolic thoracic adenopathy or extrathoracic metastatic disease. 5. Aortic Atherosclerosis (ICD10-I70.0) and Emphysema (ICD10-J43.9).  Electronically Signed   By: Ilona Sorrel M.D.   On: 05/16/2022 08:53   CT Chest W Contrast  Result Date: 04/25/2022 CLINICAL DATA:  Non-small cell lung cancer follow-up; * Tracking Code: BO * EXAM: CT CHEST WITH CONTRAST TECHNIQUE: Multidetector CT imaging of the chest was performed during intravenous contrast administration. RADIATION DOSE REDUCTION: This exam was performed according to the departmental dose-optimization program which includes automated exposure control, adjustment of the mA and/or kV according to patient size and/or use of iterative reconstruction technique. CONTRAST:  65mL OMNIPAQUE IOHEXOL 300 MG/ML  SOLN COMPARISON:  Chest CT dated December 24, 2021 and chest CT dated June 19, 2021 FINDINGS: Cardiovascular: Normal heart size. Trace pericardial effusion. Normal caliber thoracic aorta with severe calcified plaque. Severe left main and three-vessel coronary artery calcifications. Mediastinum/Nodes: Esophagus and thyroid are unremarkable. No pathologically enlarged lymph nodes seen in the chest. Lungs/Pleura: Central airways are patent. Mild centrilobular emphysema. Stable right paramediastinal and left upper lobe postradiation change. Right upper lobe posterior pulmonary nodule is slightly increased in size when compared with prior exam, measuring 1.1 cm in short axis on series 2, image 48, previously 1.0 cm, previously measured 5 mm on June 19, 2021 prior. Solid left upper lobe pulmonary nodule measuring 7 mm on series 2, image 61 is slightly increased in size, previously measured 5 mm, previously measured 4 mm on June 19, 2021 prior. Other solid pulmonary nodules are stable. Reference nodule of the left lower lobe measuring 6 mm on series 2, image 78. Upper Abdomen: No acute abnormality. Musculoskeletal: No chest wall abnormality. No acute or significant osseous findings. IMPRESSION: 1. Stable right paramediastinal and left upper lobe postradiation change. 2. Solid pulmonary nodules of  the right upper lobe and left upper lobe have slowly enlarged when compared with multiple prior exams and are concerning for metastatic disease. 3. Other scattered bilateral solid pulmonary nodules are stable. 4. Coronary artery calcifications, aortic Atherosclerosis (ICD10-I70.0) and Emphysema (ICD10-J43.9). Electronically  Signed   By: Yetta Glassman M.D.   On: 04/25/2022 19:50

## 2022-05-21 NOTE — Progress Notes (Incomplete)
Thoracic Location of Tumor / Histology: ***  Patient presented {numbers 1-12:19994} months ago with symptoms of: ***  Biopsies revealed: ***  Tobacco/Marijuana/Snuff/ETOH use: {:18581}  Past/Anticipated interventions by cardiothoracic surgery, if any: {:18581}  Past/Anticipated interventions by medical oncology, if any: {:18581}  Signs/Symptoms Weight changes, if any: {:18581} Respiratory complaints, if any: {:18581} Hemoptysis, if any: {:18581} Pain issues, if any:  {:18581}  SAFETY ISSUES: Prior radiation? Yes: 09/10/2020 through 09/17/2020 Site Technique Total Dose (Gy) Dose per Fx (Gy) Completed Fx Beam Energies  Lung, Right: Lung_Rt SBRT 54/54 18 3/3 6XFFF  Lung, Left: Lung_Lt SBRT 54/54 18 3/3 6XFFF  06/25/2016 to 08/20/2016 under the care of Dr. Quitman Livings in [place], [place]  Pacemaker/ICD? ***  Possible current pregnancy? N/A Is the patient on methotrexate? ***  Current Complaints / other details:  ***

## 2022-05-21 NOTE — Patient Instructions (Signed)
[place] at [facility] Discharge Instructions   You were seen and examined today by Dr. Delton Coombes.  He reviewed the results of your PET scan which shows the spot in your lung have increased. He recommends you get radiation treatment again to these spots to get them under control. There is no other evidence of cancer in your body.   We will arrange for you to have radiation treatment in [place].   Return as scheduled.    Thank you for choosing [place] at [facility] to provide your oncology and hematology care.  To afford each patient quality time with our provider, please arrive at least 15 minutes before your scheduled appointment time.   If you have a lab appointment with the [facility] please come in thru the Main Entrance and check in at the main information desk.  You need to re-schedule your appointment should you arrive 10 or more minutes late.  We strive to give you quality time with our providers, and arriving late affects you and other patients whose appointments are after yours.  Also, if you no show three or more times for appointments you may be dismissed from the clinic at the providers discretion.     Again, thank you for choosing [facility].  Our hope is that these requests will decrease the amount of time that you wait before being seen by our physicians.       _____________________________________________________________  Should you have questions after your visit to [facility], please contact our office at [phone] and follow the prompts.  Our office hours are 8:00 a.m. and 4:30 p.m. Monday - Friday.  Please note that voicemails left after 4:00 p.m. may not be returned until the following business day.  We are closed weekends and major holidays.  You do have access to a nurse 24-7, just call the main number to the clinic [phone] and do not press any options, hold on the line and a  nurse will answer the phone.    For prescription refill requests, have your pharmacy contact our office and allow 72 hours.    Due to Covid, you will need to wear a mask upon entering the hospital. If you do not have a mask, a mask will be given to you at the Main Entrance upon arrival. For doctor visits, patients may have 1 support person age 27 or older with them. For treatment visits, patients can not have anyone with them due to social distancing guidelines and our immunocompromised population.

## 2022-05-24 NOTE — Progress Notes (Signed)
Radiation Oncology         [phone] ________________________________  Outpatient Re-Consultation  Name: John [name] MRN: [id number]  Date: 05/26/2022  DOB: 07/15/1945  NU:UVOZD, [name], FNP  Derek Jack, MD   REFERRING PHYSICIAN: Derek Jack, MD  DIAGNOSIS: There were no encounter diagnoses.  Stage IIIa (T1b, N2, cM0) non-small cell carcinoma, now with enlarging bilateral upper lobe nodules   Interval Since Last Radiation:  1 year, 8 months, and 6 days    Intent: Curative  Radiation Treatment Dates: 09/10/2020 through 09/17/2020 Site Technique Total Dose (Gy) Dose per Fx (Gy) Completed Fx Beam Energies  Lung, Right: Lung_Rt SBRT 54/54 18 3/3 6XFFF  Lung, Left: Lung_Lt SBRT 54/54 18 3/3 6XFFF    HISTORY OF PRESENT ILLNESS::John [name] is a 77 y.o. male who is accompanied by ***. he is seen as a courtesy of Dr. Delton Coombes for re-evaluation and an opinion concerning further radiation therapy as part of management for his enlarging bilateral pulmonary nodules in the RUL and LUL. I last met met with the patient on 10/18/2020 for follow-up of radiation to the bilateral upper lobe nodules.   Since that time, the patient has been doing well from a respiratory standpoint other than stable baseline cough and SOB, and has maintained surveillance visits with Dr. Delton Coombes. He also continues to follow with vascular surgery for his history of PAD.   CT scan of the chest with contrast performed on 12/17/2020 showed stability to a slight decrease in size of the bilateral pulmonary nodules since his 08/13/2020 PET scan. PET also showed no new or progressive metastatic disease, and stable postradiation changes in the right perihilar lung.  Follow-up chest CT on 06/19/21 again showed stable findings related to the bilateral nodules.   Follow-up chest CT on 12/24/21 however demonstrated an increase in size of the RUL pulmonary nodule from 5 mm to 8 mm, concerning for  disease progression. The other scattered bilateral pulmonary nodules otherwise appeared stable in the interval. Post treatment scarring in both lungs also appeared stable.   For the enlarging RUL nodule, Dr. Delton Coombes recommended proceeding with follow-up imaging in 4 months.  Follow-up chest CT on 04/25/22 demonstrated a further increase in size of the RUL nodule, along with an increase in size of the LUL nodules, raising concern for further disease progression. CT otherwise showed stable right paramediastinal and left upper lobe postradiation changes.   Restaging PET scan on 05/15/22 showed: mild hypermetabolism associated with the RUL nodule, measuring 1.2 cm, previously 0.5 cm in March 2023, concerning for pulmonary metastatic disease progression; low level FDG uptake associated with a solid 0.8 cm LUL pulmonary nodule, increased in size from 0.4 cm since March 2023 CT, also suspicious for enlarging pulmonary metastasis; and low level FDG uptake associated with a slightly increased discrete solid nodular component at the site of postradiation changes in the LUL, measuring 1.7 cm, previously 1.4 cm on imaging performed in September 2023, equivocal for recurrent malignancy.    Dr. Delton Coombes recommends radiation therapy to the enlarging nodules in the RUL and LUL which we will discuss in detail today.   Of note: The patient continues to smoke. He currently smokes around 1/2 ppd.   The patient also suffered a fall this past October which resulted in him hitting his head. This prompted a head CT on 01/28/22 which showed no acute intracranial abnormalities. He also had a CT AP performed on 09/13/21 for evaluation of abdominal pain and a bleeding ulcer which  showed no findings to correlate with GI bleeding, and no inflammation or infection in the GI tract. CT also showed a new small right effusion and stability of the left pulmonary nodule.    PAST MEDICAL HISTORY:  Past Medical History:  Diagnosis  Date   Adenocarcinoma of lung, right ([place]) 04/18/2016   Anemia    Anxiety    Arthritis    Asthma    Atrial flutter (HCC)    BPH (benign prostatic hyperplasia)    with urinary retention 02/06/20   CHF (congestive heart failure) (HCC)    COPD (chronic obstructive pulmonary disease) ([place])    Depression    Diabetes mellitus, type II ([place])    Dyspnea    History of kidney stones    History of radiation therapy    right lung 09/10/2020-09/17/2020   Dr Sondra Come   Hyperlipidemia    Hypertension    Hypothyroidism    Macular degeneration    Neuropathy    Non-small cell lung cancer, right ([place]) 04/18/2016   Peripheral vascular disease ([place])    Prostatitis    Pulmonary nodule, left 07/16/2016   Sleep apnea    cpap    PAST SURGICAL HISTORY: Past Surgical History:  Procedure Laterality Date   A-FLUTTER ABLATION N/A 01/13/2022   Procedure: A-FLUTTER ABLATION;  Surgeon: Vickie Epley, MD;  Location: [facility];  Service: Cardiovascular;  Laterality: N/A;   ABDOMINAL AORTOGRAM W/LOWER EXTREMITY Left 02/06/2020   Procedure: ABDOMINAL AORTOGRAM W/LOWER EXTREMITY;  Surgeon: Lorretta Harp, MD;  Location: [facility];  Service: Cardiovascular;  Laterality: Left;   ABDOMINAL AORTOGRAM W/LOWER EXTREMITY N/A 05/30/2021   Procedure: ABDOMINAL AORTOGRAM W/LOWER EXTREMITY;  Surgeon: Marty Heck, MD;  Location: [facility] [facility];  Service: Cardiovascular;  Laterality: N/A;   BIOPSY  09/16/2021   Procedure: BIOPSY;  Surgeon: Gatha Mayer, MD;  Location: [facility];  Service: Gastroenterology;;   CARDIOVERSION N/A 10/05/2020   Procedure: CARDIOVERSION;  Surgeon: Geralynn Rile, MD;  Location: [facility];  Service: Cardiovascular;  Laterality: N/A;   CATARACT EXTRACTION, BILATERAL Bilateral    COLONOSCOPY WITH PROPOFOL N/A 06/20/2021   Procedure: COLONOSCOPY WITH PROPOFOL;  Surgeon: Irene Shipper, MD;  Location: [facility];  Service: Endoscopy;  Laterality: N/A;    COLONOSCOPY WITH PROPOFOL N/A 09/16/2021   Procedure: COLONOSCOPY WITH PROPOFOL;  Surgeon: Gatha Mayer, MD;  Location: [facility];  Service: Gastroenterology;  Laterality: N/A;   ENDARTERECTOMY FEMORAL Right 08/20/2020   Procedure: ENDARTERECTOMY  RIGHT FEMORAL ARTERY;  Surgeon: Marty Heck, MD;  Location: [facility];  Service: Vascular;  Laterality: Right;   ENTEROSCOPY N/A 10/11/2021   Procedure: ENTEROSCOPY;  Surgeon: Harvel Quale, MD;  [facility];  Service: Gastroenterology;  Laterality: N/A;   ESOPHAGOGASTRODUODENOSCOPY N/A 09/16/2021   Procedure: ESOPHAGOGASTRODUODENOSCOPY (EGD);  Surgeon: Gatha Mayer, MD;  Location: [place];  Service: Gastroenterology;  Laterality: N/A;   ESOPHAGOGASTRODUODENOSCOPY (EGD) WITH PROPOFOL N/A 09/06/2020   Procedure: ESOPHAGOGASTRODUODENOSCOPY (EGD) WITH PROPOFOL;  Surgeon: Irene Shipper, MD;  [facility];  Service: Endoscopy;  Laterality: N/A;   ESOPHAGOGASTRODUODENOSCOPY (EGD) WITH PROPOFOL N/A 10/11/2021   Procedure: ESOPHAGOGASTRODUODENOSCOPY (EGD) WITH PROPOFOL;  Surgeon: Harvel Quale, MD;  [facility];  Service: Gastroenterology;  Laterality: N/A;   HEMOSTASIS CLIP PLACEMENT  09/16/2021   Procedure: HEMOSTASIS CLIP PLACEMENT;  Surgeon: Gatha Mayer, MD;  Location: [place];  Service: Gastroenterology;;   HOT HEMOSTASIS N/A 09/16/2021  Procedure: HOT HEMOSTASIS (ARGON PLASMA COAGULATION/BICAP);  Surgeon: Gatha Mayer, MD;  Location: [place];  Service: Gastroenterology;  Laterality: N/A;   HOT HEMOSTASIS  10/11/2021   Procedure: HOT HEMOSTASIS (ARGON PLASMA COAGULATION/BICAP);  Surgeon: Montez Morita, Quillian Quince, MD;  [facility];  Service: Gastroenterology;;   INSERTION OF ILIAC STENT Right 08/20/2020   Procedure: RETROGRADE INSERTION OF RIGHT ILIAC STENT;  Surgeon: Marty Heck, MD;  Location: [facility] [place];  Service: Vascular;  Laterality: Right;    INTRAOPERATIVE ARTERIOGRAM Right 08/20/2020   Procedure: INTRA OPERATIVE ARTERIOGRAM ILIAC;  Surgeon: Marty Heck, MD;  Location: [place];  Service: Vascular;  Laterality: Right;   PATCH ANGIOPLASTY Right 08/20/2020   Procedure: PATCH ANGIOPLASTY RIGHT FEMORAL ARTERY;  Surgeon: Marty Heck, MD;  Location: [place];  Service: Vascular;  Laterality: Right;   PERIPHERAL VASCULAR BALLOON ANGIOPLASTY Right 05/30/2021   Procedure: PERIPHERAL VASCULAR BALLOON ANGIOPLASTY;  Surgeon: Marty Heck, MD;  Location: [place] [facility] [facility];  Service: Cardiovascular;  Laterality: Right;   PORTACATH PLACEMENT Left 06/13/2016   Procedure: INSERTION PORT-A-CATH;  Surgeon: Aviva Signs, MD;  Location[facility];  Service: General;  Laterality: Left;   TRANSURETHRAL RESECTION OF PROSTATE N/A 05/31/2020   Procedure: TRANSURETHRAL RESECTION OF THE PROSTATE (TURP);  Surgeon: Cleon Gustin, MD;  Location[facility];  Service: Urology;  Laterality: N/A;   VIDEO BRONCHOSCOPY WITH ENDOBRONCHIAL NAVIGATION N/A 05/28/2016   Procedure: VIDEO BRONCHOSCOPY WITH ENDOBRONCHIAL NAVIGATION;  Surgeon: [name], MD;  Location: [facility];  Service: Thoracic;  Laterality: N/A;   VIDEO BRONCHOSCOPY WITH ENDOBRONCHIAL ULTRASOUND N/A 05/28/2016   Procedure: VIDEO BRONCHOSCOPY WITH ENDOBRONCHIAL ULTRASOUND;  Surgeon: [name], MD;  Location: [facility];  Service: Thoracic;  Laterality: N/A;    FAMILY HISTORY:  Family History  Problem Relation Age of Onset   Hypertension Mother    Diabetes Father    Heart disease Father    Stroke Father    Hypertension Sister     SOCIAL HISTORY:  Social History   Tobacco Use   Smoking status: Every Day    Packs/day: 0.50    Years: 55.00    Total pack years: 27.50    Types: Cigarettes    Start date: 03/11/1961   Smokeless tobacco: Never   Tobacco comments:    1/2 pack to 1 pack per day 12/14/19  Vaping Use   Vaping Use: Never used  Substance Use Topics   Alcohol  use: No   Drug use: No    ALLERGIES:  Allergies  Allergen Reactions   Sulfa Antibiotics Swelling    Mouth swelling   Atorvastatin Other (See Comments)    Muscle aches - tolerating Pravastatin 40 mg MWF   Jardiance [Empagliflozin] Other (See Comments)    FEELS SLUGGISH, TIRED   Lopressor [Metoprolol] Other (See Comments)    Fatigue   Rosuvastatin Other (See Comments)    Muscle aches - tolerating Pravastatin 40 mg MWF   Doxycycline Nausea Only   Temazepam Other (See Comments)    Made insomnia worse     MEDICATIONS:  Current Outpatient Medications  Medication Sig Dispense Refill   aspirin EC 81 MG tablet Take 1 tablet (81 mg total) by mouth daily. Swallow whole. 90 tablet 3   Blood Glucose Monitoring Suppl (ONETOUCH VERIO REFLECT) w/Device KIT Use to test blood sugars daily as directed. DX: E11.9 1 kit 1   colchicine 0.6 MG tablet Take 2 tabs immediately, then 1 tab twice per day  for the duration of the flare up to a max of 7 days 21 tablet 0   [employer] IRON 325 (65 Fe) MG tablet TAKE 1 TABLET (325MG ) TWICE DAILY 200 tablet 1   dutasteride (AVODART) 0.5 MG capsule Take 1 capsule (0.5 mg total) by mouth daily. 90 capsule 3   ezetimibe (ZETIA) 10 MG tablet TAKE 1 TABLET BY MOUTH EVERY DAY 90 tablet 3   famotidine (PEPCID) 20 MG tablet One after supper (Patient taking differently: Take 20 mg by mouth daily with supper.) 30 tablet 11   fluticasone (FLONASE) 50 MCG/ACT nasal spray PLACE 1 SPRAY INTO BOTH NOSTRILS DAILY AS NEEDED FOR ALLERGIES OR RHINITIS. 48 mL 3   Fluticasone-Umeclidin-Vilant (TRELEGY ELLIPTA) 100-62.5-25 MCG/ACT AEPB Inhale 1 puff into the lungs daily. 1 each 11   gabapentin (NEURONTIN) 300 MG capsule TAKE 1 CAPSULE BY MOUTH THREE TIMES A DAY 270 capsule 0   KLOR-CON M20 20 MEQ tablet TAKE 1 TABLET BY MOUTH EVERY DAY 90 tablet 0   levothyroxine (SYNTHROID) 50 MCG tablet TAKE 1 TABLET BY MOUTH EVERY DAY 90 tablet 1   linaclotide (LINZESS) 72 MCG capsule Take 1 capsule (72  mcg total) by mouth daily as needed (constipation).     metolazone (ZAROXOLYN) 2.5 MG tablet TAKE 1 TABLET BY MOUTH ON MONDAY, WEDNESDAY AND FRIDAY 45 tablet 1   metoprolol succinate (TOPROL XL) 25 MG 24 hr tablet Take 0.5 tablets (12.5 mg total) by mouth daily. 45 tablet 3   NON FORMULARY CPAP at bedtime     ondansetron (ZOFRAN) 4 MG tablet TAKE 1 TABLET BY MOUTH EVERY 8 HOURS AS NEEDED FOR NAUSEA AND VOMITING 60 tablet 2   OneTouch Delica Lancets 09B MISC Use to test blood sugars daily as directed. DX: E11.9 100 each 1   ONETOUCH VERIO test strip USE TO TEST BLOOD SUGARS DAILY AS DIRECTED. DX: E11.9 100 strip 3   OXYGEN Inhale 2 L into the lungs daily as needed (with Cpap at night).     pantoprazole (PROTONIX) 20 MG tablet Take 1 tablet (20 mg total) by mouth daily. 90 tablet 1   pravastatin (PRAVACHOL) 40 MG tablet TAKE 1 TABLET (40 MG TOTAL) BY MOUTH ON MONDAYS , WEDNESDAYS, AND FRIDAYS AS DIRECTED 48 tablet 0   predniSONE (STERAPRED UNI-PAK 21 TAB) 10 MG (21) TBPK tablet Use as directed 21 tablet 0   Semaglutide (RYBELSUS) 14 MG TABS Take 1 tablet (14 mg total) by mouth daily. 30 tablet 3   spironolactone (ALDACTONE) 25 MG tablet Take 1 tablet (25 mg total) by mouth daily. 90 tablet 3   torsemide (DEMADEX) 20 MG tablet Take 1 tablet (20 mg total) by mouth daily. 90 tablet 2   zolpidem (AMBIEN) 5 MG tablet Take 1 tablet (5 mg total) by mouth at bedtime as needed for sleep. 30 tablet 2   No current facility-administered medications for this encounter.    REVIEW OF SYSTEMS:  A 10+ POINT REVIEW OF SYSTEMS WAS OBTAINED including neurology, dermatology, psychiatry, cardiac, respiratory, lymph, extremities, GI, GU, musculoskeletal, constitutional, reproductive, HEENT. ***   PHYSICAL EXAM:  vitals were not taken for this visit.   General: Alert and oriented, in no acute distress HEENT: Head is normocephalic. Extraocular movements are intact. Oropharynx is clear. Neck: Neck is supple, no palpable  cervical or supraclavicular lymphadenopathy. Heart: Regular in rate and rhythm with no murmurs, rubs, or gallops. Chest: Clear to auscultation bilaterally, with no rhonchi, wheezes, or rales. Abdomen: Soft, nontender, nondistended, with no  rigidity or guarding. Extremities: No cyanosis or edema. Lymphatics: see Neck Exam Skin: No concerning lesions. Musculoskeletal: symmetric strength and muscle tone throughout. Neurologic: Cranial nerves II through XII are grossly intact. No obvious focalities. Speech is fluent. Coordination is intact. Psychiatric: Judgment and insight are intact. Affect is appropriate. ***  ECOG = ***  0 - Asymptomatic (Fully active, able to carry on all predisease activities without restriction)  1 - Symptomatic but completely ambulatory (Restricted in physically strenuous activity but ambulatory and able to carry out work of a light or sedentary nature. For example, light housework, office work)  2 - Symptomatic, <50% in bed during the day (Ambulatory and capable of all self care but unable to carry out any work activities. Up and about more than 50% of waking hours)  3 - Symptomatic, >50% in bed, but not bedbound (Capable of only limited self-care, confined to bed or chair 50% or more of waking hours)  4 - Bedbound (Completely disabled. Cannot carry on any self-care. Totally confined to bed or chair)  5 - Death   Eustace Pen MM, Creech RH, Tormey DC, et al. [phone]). "Toxicity and response criteria of the [facility]". [place] Oncol. 5 (6): 649-55  LABORATORY DATA:  Lab Results  Component Value Date   WBC 11.0 (H) 04/25/2022   HGB 12.6 (L) 04/25/2022   HCT 38.1 (L) 04/25/2022   MCV 102.1 (H) 04/25/2022   PLT 297 04/25/2022   NEUTROABS 8.9 (H) 04/25/2022   Lab Results  Component Value Date   NA 133 (L) 04/25/2022   K 4.4 04/25/2022   CL 94 (L) 04/25/2022   CO2 28 04/25/2022   GLUCOSE 202 (H) 04/25/2022   BUN 21 04/25/2022    CREATININE 0.99 04/25/2022   CALCIUM 9.1 04/25/2022      RADIOGRAPHY: NM PET Image Restag (PS) Skull Base To Thigh  Result Date: 05/16/2022 CLINICAL DATA:  Subsequent treatment strategy for non-small cell lung cancer with enlarging pulmonary nodules. EXAM: NUCLEAR MEDICINE PET SKULL BASE TO THIGH TECHNIQUE: 8.4 mCi F-18 FDG was injected intravenously. Full-ring PET imaging was performed from the skull base to thigh after the radiotracer. CT data was obtained and used for attenuation correction and anatomic localization. Fasting blood glucose: 215 mg/dl COMPARISON:  04/25/2022 chest CT.  08/13/2020 PET-CT. FINDINGS: Mediastinal blood pool activity: SUV max 2.5 Liver activity: SUV max NA NECK: No hypermetabolic lymph nodes in the neck. Incidental CT findings: None. CHEST: No enlarged or hypermetabolic axillary, mediastinal or hilar lymph nodes. Mildly hypermetabolic solid 1.2 cm posterior right upper lobe pulmonary nodule with max SUV 2.9 (series 7/image 48), increased from 0.5 cm on 06/19/2021 chest CT. Solid 0.8 cm left upper lobe pulmonary nodule demonstrates low level FDG uptake with max SUV 1.5 (series 7/image 60), increased from 0.4 cm on 06/19/2021 chest CT. Generalized nonfocal mild FDG uptake associated with sharply marginated paramediastinal consolidation in the upper right lung with associated volume loss and distortion, compatible with radiation fibrosis. There is slightly increased discrete solid 1.7 cm nodular component at the site of sharply marginated consolidation in posterior left upper lobe with associated volume loss and distortion with associated low level FDG uptake with max SUV 2.4 (series 7/image 41), increased from 1.4 cm on 12/24/2021, equivocal for recurrent neoplasm at the site of postradiation change. Additional solid apical left upper lobe 0.7 cm (series 7/image 36) and peripheral left lower lobe 0.5 cm (series 7/image 73) pulmonary nodules are below PET resolution and not  definitely changed back to 06/19/2021 chest CT, more likely benign. Incidental CT findings: Left subclavian Port-A-Cath terminates in the middle third of the SVC. Moderate centrilobular emphysema. Coronary atherosclerosis. Atherosclerotic nonaneurysmal thoracic aorta. Subcutaneous loop recorder in the medial ventral left chest wall. ABDOMEN/PELVIS: No abnormal hypermetabolic activity within the liver, pancreas, adrenal glands, or spleen. No hypermetabolic lymph nodes in the abdomen or pelvis. Incidental CT findings: Atherosclerotic nonaneurysmal abdominal aorta. Simple 2.4 cm posterior interpolar right renal cyst, for which no follow-up imaging is recommended. Moderate colonic diverticulosis. Post TURP changes. SKELETON: No focal hypermetabolic activity to suggest skeletal metastasis. Incidental CT findings: None. IMPRESSION: 1. Mildly hypermetabolic solid 1.2 cm posterior right upper lobe pulmonary nodule, increased in size from 0.5 cm on 06/19/2021 chest CT, compatible with enlarging pulmonary metastasis. 2. Low level FDG uptake associated with a solid 0.8 cm left upper lobe pulmonary nodule, increased in size from 0.4 cm on 06/19/2021 chest CT, also suspicious for enlarging pulmonary metastasis. 3. Low level FDG uptake associated with slightly increased discrete solid 1.7 cm nodular component at the site of postradiation change in the left upper lobe, increased from 1.4 cm on 12/24/2021, equivocal for recurrent malignancy. Suggest attention on follow-up chest CT in 3 months. 4. No hypermetabolic thoracic adenopathy or extrathoracic metastatic disease. 5. Aortic Atherosclerosis (ICD10-I70.0) and Emphysema (ICD10-J43.9). Electronically Signed   By: Ilona Sorrel M.D.   On: 05/16/2022 08:53   CT Chest W Contrast  Result Date: 04/25/2022 CLINICAL DATA:  Non-small cell lung cancer follow-up; * Tracking Code: BO * EXAM: CT CHEST WITH CONTRAST TECHNIQUE: Multidetector CT imaging of the chest was performed during  intravenous contrast administration. RADIATION DOSE REDUCTION: This exam was performed according to the departmental dose-optimization program which includes automated exposure control, adjustment of the mA and/or kV according to patient size and/or use of iterative reconstruction technique. CONTRAST:  28mL OMNIPAQUE IOHEXOL 300 MG/ML  SOLN COMPARISON:  Chest CT dated December 24, 2021 and chest CT dated June 19, 2021 FINDINGS: Cardiovascular: Normal heart size. Trace pericardial effusion. Normal caliber thoracic aorta with severe calcified plaque. Severe left main and three-vessel coronary artery calcifications. Mediastinum/Nodes: Esophagus and thyroid are unremarkable. No pathologically enlarged lymph nodes seen in the chest. Lungs/Pleura: Central airways are patent. Mild centrilobular emphysema. Stable right paramediastinal and left upper lobe postradiation change. Right upper lobe posterior pulmonary nodule is slightly increased in size when compared with prior exam, measuring 1.1 cm in short axis on series 2, image 48, previously 1.0 cm, previously measured 5 mm on June 19, 2021 prior. Solid left upper lobe pulmonary nodule measuring 7 mm on series 2, image 61 is slightly increased in size, previously measured 5 mm, previously measured 4 mm on June 19, 2021 prior. Other solid pulmonary nodules are stable. Reference nodule of the left lower lobe measuring 6 mm on series 2, image 78. Upper Abdomen: No acute abnormality. Musculoskeletal: No chest wall abnormality. No acute or significant osseous findings. IMPRESSION: 1. Stable right paramediastinal and left upper lobe postradiation change. 2. Solid pulmonary nodules of the right upper lobe and left upper lobe have slowly enlarged when compared with multiple prior exams and are concerning for metastatic disease. 3. Other scattered bilateral solid pulmonary nodules are stable. 4. Coronary artery calcifications, aortic Atherosclerosis (ICD10-I70.0) and Emphysema  (ICD10-J43.9). Electronically Signed   By: Yetta Glassman M.D.   On: 04/25/2022 19:50      IMPRESSION: Stage IIIa (T1b, N2, cM0) non-small cell carcinoma, now with enlarging bilateral upper lobe nodules   ***  Today, I talked to the patient and family about the findings and work-up thus far.  We discussed the natural history of *** and general treatment, highlighting the role of radiotherapy in the management.  We discussed the available radiation techniques, and focused on the details of logistics and delivery.  We reviewed the anticipated acute and late sequelae associated with radiation in this setting.  The patient was encouraged to ask questions that I answered to the best of my ability. *** A patient consent form was discussed and signed.  We retained a copy for our records.  The patient would like to proceed with radiation and will be scheduled for CT simulation.  PLAN: ***    *** minutes of total time was spent for this patient encounter, including preparation, face-to-face counseling with the patient and coordination of care, physical exam, and documentation of the encounter.   ------------------------------------------------  Blair Promise, PhD, MD  This document serves as a record of services personally performed by Gery Pray, MD. It was created on his behalf by Roney Mans, a trained medical scribe. The creation of this record is based on the scribe's personal observations and the provider's statements to them. This document has been checked and approved by the attending provider.

## 2022-05-26 ENCOUNTER — Encounter: Payer: Self-pay | Admitting: Radiation Oncology

## 2022-05-26 ENCOUNTER — Ambulatory Visit
Admission: RE | Admit: 2022-05-26 | Discharge: 2022-05-26 | Disposition: A | Discharge status: Home / Self Care | Payer: Medicare Other | Source: Ambulatory Visit | Attending: Radiation Oncology | Admitting: Radiation Oncology

## 2022-05-26 VITALS — BP 105/58 | HR 99 | Resp 18 | Wt 153.4 lb

## 2022-05-26 DIAGNOSIS — Z79899 Other long term (current) drug therapy: Secondary | ICD-10-CM | POA: Insufficient documentation

## 2022-05-26 DIAGNOSIS — I739 Peripheral vascular disease, unspecified: Secondary | ICD-10-CM | POA: Insufficient documentation

## 2022-05-26 DIAGNOSIS — Z87442 Personal history of urinary calculi: Secondary | ICD-10-CM | POA: Insufficient documentation

## 2022-05-26 DIAGNOSIS — Z923 Personal history of irradiation: Secondary | ICD-10-CM | POA: Insufficient documentation

## 2022-05-26 DIAGNOSIS — Z7982 Long term (current) use of aspirin: Secondary | ICD-10-CM | POA: Insufficient documentation

## 2022-05-26 DIAGNOSIS — E039 Hypothyroidism, unspecified: Secondary | ICD-10-CM | POA: Insufficient documentation

## 2022-05-26 DIAGNOSIS — F17210 Nicotine dependence, cigarettes, uncomplicated: Secondary | ICD-10-CM | POA: Diagnosis not present

## 2022-05-26 DIAGNOSIS — I2510 Atherosclerotic heart disease of native coronary artery without angina pectoris: Secondary | ICD-10-CM | POA: Insufficient documentation

## 2022-05-26 DIAGNOSIS — G4730 Sleep apnea, unspecified: Secondary | ICD-10-CM | POA: Insufficient documentation

## 2022-05-26 DIAGNOSIS — C3412 Malignant neoplasm of upper lobe, left bronchus or lung: Secondary | ICD-10-CM | POA: Insufficient documentation

## 2022-05-26 DIAGNOSIS — N400 Enlarged prostate without lower urinary tract symptoms: Secondary | ICD-10-CM | POA: Insufficient documentation

## 2022-05-26 DIAGNOSIS — C3491 Malignant neoplasm of unspecified part of right bronchus or lung: Secondary | ICD-10-CM

## 2022-05-26 DIAGNOSIS — I10 Essential (primary) hypertension: Secondary | ICD-10-CM | POA: Insufficient documentation

## 2022-05-26 DIAGNOSIS — E119 Type 2 diabetes mellitus without complications: Secondary | ICD-10-CM | POA: Diagnosis not present

## 2022-05-26 DIAGNOSIS — Z79890 Hormone replacement therapy (postmenopausal): Secondary | ICD-10-CM | POA: Diagnosis not present

## 2022-05-26 DIAGNOSIS — C3411 Malignant neoplasm of upper lobe, right bronchus or lung: Secondary | ICD-10-CM | POA: Insufficient documentation

## 2022-05-26 DIAGNOSIS — I509 Heart failure, unspecified: Secondary | ICD-10-CM | POA: Insufficient documentation

## 2022-05-26 DIAGNOSIS — E785 Hyperlipidemia, unspecified: Secondary | ICD-10-CM | POA: Diagnosis not present

## 2022-05-26 DIAGNOSIS — J449 Chronic obstructive pulmonary disease, unspecified: Secondary | ICD-10-CM | POA: Diagnosis not present

## 2022-05-26 DIAGNOSIS — J432 Centrilobular emphysema: Secondary | ICD-10-CM | POA: Insufficient documentation

## 2022-05-26 NOTE — Progress Notes (Signed)
Nurse sent form to device clinic for patients loop recorder

## 2022-05-26 NOTE — Progress Notes (Signed)
Thoracic Location of Tumor / Histology:  Stage IIIa (T1b N2 cM0) NSCLC, favoring adenocarcinoma  Patient presented with symptoms of:  Restaging PET Scan 05/15/2022 --IMPRESSION: Mildly hypermetabolic solid 1.2 cm posterior right upper lobe pulmonary nodule, increased in size from 0.5 cm on 06/19/2021 chest CT, compatible with enlarging pulmonary metastasis. Low level FDG uptake associated with a solid 0.8 cm left upper lobe pulmonary nodule, increased in size from 0.4 cm on 06/19/2021 chest CT, also suspicious for enlarging pulmonary metastasis. Low level FDG uptake associated with slightly increased discrete solid 1.7 cm nodular component at the site of postradiation change in the left upper lobe, increased from 1.4 cm on 12/24/2021, equivocal for recurrent malignancy. Suggest attention on follow-up chest CT in 3 months. No hypermetabolic thoracic adenopathy or extrathoracic metastatic disease. Aortic Atherosclerosis (ICD10-I70.0) and Emphysema (ICD10-J43.9).  CT Chest w/ Contrast 04/25/2022 --IMPRESSION: Stable right paramediastinal and left upper lobe postradiation change. Solid pulmonary nodules of the right upper lobe and left upper lobe have slowly enlarged when compared with multiple prior exams and are concerning for metastatic disease. Other scattered bilateral solid pulmonary nodules are stable. Coronary artery calcifications, aortic Atherosclerosis (ICD10-I70.0) and Emphysema (ICD10-J43.9).  Tobacco/Marijuana/Snuff/ETOH use: yes ,half pack of cigarettes a day.  Past/Anticipated interventions by cardiothoracic surgery, if any:  --Nothing recently   05/28/2016 --Dr. Modesto Charon Electromagnetic navigational bronchoscopy with brushings, needle aspirations and biopsies of bilateral upper lobe nodules Endobronchial ultrasound with mediastinal and hilar lymph node aspirations  Past/Anticipated interventions by medical oncology, if any:  Under care of Dr. Derek Jack   05/21/2022 --Plan: CT chest from 04/25/2022 showed solid pulmonary nodules in the right upper lobe 1.1 cm and left upper lobe nodule 7 mm, previously 5 mm both increasing in size.  No clear adenopathy. We reviewed images of the PET scan from 0/0/1749: Hypermetabolic 1.2 cm right upper lobe nodule increased in size from 0.5 centimeter from 06/19/2021 chest CT.  Low-level FDG uptake associated with the solid 0.8 cm left upper lobe lung nodule increased in size from 0.4 cm on 06/19/2021.  Low-level FDG uptake associated with 1.7 cm nodular component at the site of postradiation change in the left upper lobe increased from 1.4 cm equivocal.  No adenopathy or metastatic disease. I have recommended that he be seen by Dr. Sondra Come for any potential radiation. Recommend follow-up in 4 months with repeat CT chest.  Signs/Symptoms Weight changes, if any: A little weight gain Respiratory complaints, if any: Some sob with activity and at rest. Patient has copd. Hemoptysis, if any: no Pain issues, if any:  none  SAFETY ISSUES: Prior radiation? Yes: 09/10/2020 through 09/17/2020 Site Technique Total Dose (Gy) Dose per Fx (Gy) Completed Fx Beam Energies  Lung, Right: Lung_Rt SBRT 54/54 18 3/3 6XFFF  Lung, Left: Lung_Lt SBRT 54/54 18 3/3 6XFFF  06/25/2016 to 08/20/2016 under the care of Dr. Quitman Livings in [place], [place]  Pacemaker/ICD? States that he has a monitor in his chest states it not a pace makeker Possible current pregnancy? N/A Is the patient on methotrexate? no  Current Complaints / other details:   Vitals:   05/26/22 0836  BP: (!) 105/58  Pulse: 99  Resp: 18  SpO2: 100%  Weight: 69.6 kg

## 2022-06-02 ENCOUNTER — Ambulatory Visit: Payer: Medicare Other

## 2022-06-02 DIAGNOSIS — I483 Typical atrial flutter: Secondary | ICD-10-CM

## 2022-06-02 LAB — CUP PACEART REMOTE DEVICE CHECK
Date Time Interrogation Session: 20240226023100
Implantable Pulse Generator Implant Date: 20240126
Pulse Gen Serial Number: 104324

## 2022-06-03 ENCOUNTER — Encounter: Payer: Self-pay | Admitting: Cardiology

## 2022-06-03 NOTE — Progress Notes (Signed)
TO BE COMPLETED BY RADIATION ONCOLOGIST OFFICE:   Patient Name: John [name]   Date of Birth: 1945-08-15   Radiation Oncologist: Dr. Gery Pray   Site to be Treated: Right Lung   Will x-rays >10 MV be used? No   Will the radiation be >10 cm from the device?   Planned Treatment Start Date: 06/05/22 patient to have CT simulation   TO BE COMPLETED BY CARDIOLOGIST OFFICE:   Device Information:  Printmaker   Brand: [employer]: [id number] Model #: BS M301 Lux-DX ICM  Serial Number: [id number]     Date of Placement: 05/02/2022  Site of Placement: left chest  Remote Device Check--Frequency: monthly Last Check: 06/02/2022  Is the Patient Pacer Dependent?:  Yes '[]'$   No '[]'$   Does cardiologist request Radiation Oncology to schedule device testing by vendor for the following:  Prior to the Initiation of Treatments?  Yes '[]'$  No '[x]'$  During Treatments?  Yes '[]'$  No '[x]'$  Post Radiation Treatments?  Yes '[]'$  No '[x]'$   Is device monitoring necessary by vendor/cardiologist team during treatments?  Yes '[]'$   No '[x]'$   Is cardiac monitoring by Radiation Oncology nursing necessary during treatments? Yes '[]'$   No '[x]'$   Do you recommend device be relocated prior to Radiation Treatment? Yes '[]'$   No '[x]'$   **PLEASE LIST ANY NOTES OR SPECIAL REQUESTS: Pt has an implantable loop recorder.  Radiation will not affect the function of this device.  Verified with BS representative.    CARDIOLOGIST SIGNATURE:  Dr. Lars Mage Per [facility] Standing Orders, Damian Leavell  06/03/2022 12:41 PM  **Please route completed form back to Radiation Oncology Nursing and "[facility]", OR send an update if there will be a delay in having form completed by expected start date.  **Call [phone] if you have any questions or do not get an in-basket response from a Radiation Oncology staff member

## 2022-06-05 ENCOUNTER — Other Ambulatory Visit: Payer: Self-pay

## 2022-06-05 ENCOUNTER — Ambulatory Visit
Admission: RE | Admit: 2022-06-05 | Discharge: 2022-06-05 | Disposition: A | Discharge status: Home / Self Care | Payer: Medicare Other | Source: Ambulatory Visit | Attending: Radiation Oncology | Admitting: Radiation Oncology

## 2022-06-05 DIAGNOSIS — C3411 Malignant neoplasm of upper lobe, right bronchus or lung: Secondary | ICD-10-CM | POA: Insufficient documentation

## 2022-06-05 DIAGNOSIS — C3491 Malignant neoplasm of unspecified part of right bronchus or lung: Secondary | ICD-10-CM

## 2022-06-05 DIAGNOSIS — C3412 Malignant neoplasm of upper lobe, left bronchus or lung: Secondary | ICD-10-CM | POA: Diagnosis not present

## 2022-06-10 ENCOUNTER — Telehealth: Payer: Self-pay

## 2022-06-10 NOTE — Telephone Encounter (Signed)
PA request received via CMM for Rybelsus '14MG'$  tablets  PA submitted to [facility] Medicare and is pending determination.  Key: [id number]

## 2022-06-11 NOTE — Telephone Encounter (Signed)
PA has been APPROVED from 06/10/2022 until further notice under [facility] Medicare

## 2022-06-13 ENCOUNTER — Encounter: Payer: Self-pay | Admitting: Family Medicine

## 2022-06-13 ENCOUNTER — Ambulatory Visit (INDEPENDENT_AMBULATORY_CARE_PROVIDER_SITE_OTHER): Payer: Medicare Other | Admitting: Family Medicine

## 2022-06-13 VITALS — BP 120/78 | HR 115 | Temp 97.8°F | Ht 66.0 in | Wt 151.4 lb

## 2022-06-13 DIAGNOSIS — R000 Tachycardia, unspecified: Secondary | ICD-10-CM

## 2022-06-13 DIAGNOSIS — M1A0420 Idiopathic chronic gout, left hand, without tophus (tophi): Secondary | ICD-10-CM | POA: Diagnosis not present

## 2022-06-13 MED ORDER — PREDNISONE 10 MG (21) PO TBPK: ORAL_TABLET | ORAL | 0 refills | Status: DC

## 2022-06-13 MED ORDER — METHYLPREDNISOLONE ACETATE 40 MG/ML IJ SUSP
40.0000 mg | Freq: Once | INTRAMUSCULAR | Status: AC
  Administered 2022-06-13: 40 mg via INTRAMUSCULAR

## 2022-06-13 NOTE — Progress Notes (Unsigned)
   Acute Office Visit  Subjective:     Patient ID: John [name], male    DOB: September 21, 1945, 77 y.o.   MRN: [id number]  Chief Complaint  Patient presents with   Gout    HPI Patient is in today for gout flare. He reports pain, tenderness, or erythema for 2 days. The pain has been worsening. It is very painful to the touch. He has not tried any remedies. He is not on prophylaxis.    He is established with cardiology for hx of atrial flutter. He has unwent an ablation for this. He stopped taking metoprolol recently because it makes him feel fatigued. He reports that he called cariology and told them this, but there is no documentation of this. He denies palpitations, chest pain, shortness of breath, dizziness, visual disturbances, or edema.     ROS As per HPI.      Objective:    BP 120/78   Pulse (!) 115   Temp 97.8 F (36.6 C) (Temporal)   Ht 5\' 6"  (1.676 m)   Wt 151 lb 6 oz (68.7 kg)   SpO2 98%   BMI 24.43 kg/m    Physical Exam Vitals and nursing note reviewed.  Constitutional:      General: He is not in acute distress.    Appearance: He is not ill-appearing, toxic-appearing or diaphoretic.  Cardiovascular:     Rate and Rhythm: Regular rhythm. Tachycardia present.     Heart sounds: Normal heart sounds. No murmur heard. Pulmonary:     Effort: Pulmonary effort is normal. No respiratory distress.     Breath sounds: Normal breath sounds.  Musculoskeletal:     Right lower leg: No edema.     Left lower leg: No edema.     Comments: Erythema, warmth, and swelling to left 1st finger MCP and PIP. ROM intact. Brisk cap refill.   Skin:    General: Skin is warm and dry.  Neurological:     Mental Status: He is alert and oriented to person, place, and time. Mental status is at baseline.  Psychiatric:        Mood and Affect: Mood normal.        Behavior: Behavior normal.     No results found for any visits on 06/13/22.      Assessment & Plan:   John [name] was seen today  for gout.  Diagnoses and all orders for this visit:  Chronic gout of left hand, unspecified cause Uncontrolled. Steroid IM injection today in office. Start taper pack tomorrow. Discussed prophylaxis with PCP when flare resolves.  -     methylPREDNISolone acetate (DEPO-MEDROL) injection 40 mg -     predniSONE (STERAPRED UNI-PAK 21 TAB) 10 MG (21) TBPK tablet; Use as directed on back of pill pack. Start tomorrow  Tachycardia Restart metoprolol. If fatigue persists with restart, he will notify cardiology to discuss other treatment options.   Return to office for new or worsening symptoms, or if symptoms persist.   The patient indicates understanding of these issues and agrees with the plan.   Gwenlyn Perking, FNP

## 2022-06-13 NOTE — Patient Instructions (Signed)
Gout  Gout is a condition that causes painful swelling of the joints. Gout is a type of inflammation of the joints (arthritis). This condition is caused by having too much uric acid in the body. Uric acid is a chemical that forms when the body breaks down substances called purines. Purines are important for building body proteins. When the body has too much uric acid, sharp crystals can form and build up inside the joints. This causes pain and swelling. Gout attacks can happen quickly and may be very painful (acute gout). Over time, the attacks can affect more joints and become more frequent (chronic gout). Gout can also cause uric acid to build up under the skin and inside the kidneys. What are the causes? This condition is caused by too much uric acid in your blood. This can happen because: Your kidneys do not remove enough uric acid from your blood. This is the most common cause. Your body makes too much uric acid. This can happen with some cancers and cancer treatments. It can also occur if your body is breaking down too many red blood cells (hemolytic anemia). You eat too many foods that are high in purines. These foods include organ meats and some seafood. Alcohol, especially beer, is also high in purines. A gout attack may be triggered by trauma or stress. What increases the risk? The following factors may make you more likely to develop this condition: Having a family history of gout. Being male and middle-aged. Being male and having gone through menopause. Taking certain medicines, including aspirin, cyclosporine, diuretics, levodopa, and niacin. Having an organ transplant. Having certain conditions, such as: Being obese. Lead poisoning. Kidney disease. A skin condition called psoriasis. Other factors include: Losing weight too quickly. Being dehydrated. Frequently drinking alcohol, especially beer. Frequently drinking beverages that are sweetened with a type of sugar called  fructose. What are the signs or symptoms? An attack of acute gout happens quickly. It usually occurs in just one joint. The most common place is the big toe. Attacks often start at night. Other joints that may be affected include joints of the feet, ankle, knee, fingers, wrist, or elbow. Symptoms of this condition may include: Severe pain. Warmth. Swelling. Stiffness. Tenderness. The affected joint may be very painful to touch. Shiny, red, or purple skin. Chills and fever. Chronic gout may cause symptoms more frequently. More joints may be involved. You may also have white or yellow lumps (tophi) on your hands or feet or in other areas near your joints. How is this diagnosed? This condition is diagnosed based on your symptoms, your medical history, and a physical exam. You may have tests, such as: Blood tests to measure uric acid levels. Removal of joint fluid with a thin needle (aspiration) to look for uric acid crystals. X-rays to look for joint damage. How is this treated? Treatment for this condition has two phases: treating an acute attack and preventing future attacks. Acute gout treatment may include medicines to reduce pain and swelling, including: NSAIDs, such as ibuprofen. Steroids. These are strong anti-inflammatory medicines that can be taken by mouth (orally) or injected into a joint. Colchicine. This medicine relieves pain and swelling when it is taken soon after an attack. It can be given by mouth or through an IV. Preventive treatment may include: Daily use of smaller doses of NSAIDs or colchicine. Use of a medicine that reduces uric acid levels in your blood, such as allopurinol. Changes to your diet. You may need to see   a dietitian about what to eat and drink to prevent gout. Follow these instructions at home: During a gout attack  If directed, put ice on the affected area. To do this: Put ice in a plastic bag. Place a towel between your skin and the bag. Leave the  ice on for 20 minutes, 2-3 times a day. Remove the ice if your skin turns bright red. This is very important. If you cannot feel pain, heat, or cold, you have a greater risk of damage to the area. Raise (elevate) the affected joint above the level of your heart as often as possible. Rest the joint as much as possible. If the affected joint is in your leg, you may be given crutches to use. Follow instructions from your health care provider about eating or drinking restrictions. Avoiding future gout attacks Follow a low-purine diet as told by your dietitian or health care provider. Avoid foods and drinks that are high in purines, including liver, kidney, anchovies, asparagus, herring, mushrooms, mussels, and beer. Maintain a healthy weight or lose weight if you are overweight. If you want to lose weight, talk with your health care provider. Do not lose weight too quickly. Start or maintain an exercise program as told by your health care provider. Eating and drinking Avoid drinking beverages that contain fructose. Drink enough fluids to keep your urine pale yellow. If you drink alcohol: Limit how much you have to: 0-1 drink a day for women who are not pregnant. 0-2 drinks a day for men. Know how much alcohol is in a drink. In the [place] one drink equals one 12 oz bottle of beer (355 mL), one 5 oz glass of wine (148 mL), or one 1 oz glass of hard liquor (44 mL). General instructions Take over-the-counter and prescription medicines only as told by your health care provider. Ask your health care provider if the medicine prescribed to you requires you to avoid driving or using machinery. Return to your normal activities as told by your health care provider. Ask your health care provider what activities are safe for you. Keep all follow-up visits. This is important. Where to find more information [employer] of [employer]: www.niams.nih.gov Contact a health care provider if you have: Another  gout attack. Continuing symptoms of a gout attack after 10 days of treatment. Side effects from your medicines. Chills or a fever. Burning pain when you urinate. Pain in your lower back or abdomen. Get help right away if you: Have severe or uncontrolled pain. Cannot urinate. Summary Gout is painful swelling of the joints caused by having too much uric acid in the body. The most common site for gout to occur is in the big toe, but it can affect other joints in the body. Medicines and dietary changes can help to prevent and treat gout attacks. This information is not intended to replace advice given to you by your health care provider. Make sure you discuss any questions you have with your health care provider. Document Revised: 12/26/2020 Document Reviewed: 12/26/2020 [employer] Patient Education  2023 [employer]  

## 2022-06-17 DIAGNOSIS — Z510 Encounter for antineoplastic radiation therapy: Secondary | ICD-10-CM | POA: Insufficient documentation

## 2022-06-17 DIAGNOSIS — C3412 Malignant neoplasm of upper lobe, left bronchus or lung: Secondary | ICD-10-CM | POA: Insufficient documentation

## 2022-06-17 DIAGNOSIS — C3411 Malignant neoplasm of upper lobe, right bronchus or lung: Secondary | ICD-10-CM | POA: Insufficient documentation

## 2022-06-18 DIAGNOSIS — C3412 Malignant neoplasm of upper lobe, left bronchus or lung: Secondary | ICD-10-CM | POA: Diagnosis not present

## 2022-06-18 DIAGNOSIS — C3411 Malignant neoplasm of upper lobe, right bronchus or lung: Secondary | ICD-10-CM | POA: Diagnosis not present

## 2022-06-18 DIAGNOSIS — Z510 Encounter for antineoplastic radiation therapy: Secondary | ICD-10-CM | POA: Diagnosis not present

## 2022-06-19 ENCOUNTER — Other Ambulatory Visit: Payer: Self-pay

## 2022-06-19 ENCOUNTER — Ambulatory Visit
Admission: RE | Admit: 2022-06-19 | Discharge: 2022-06-19 | Disposition: A | Discharge status: Home / Self Care | Payer: Medicare Other | Source: Ambulatory Visit | Attending: Radiation Oncology | Admitting: Radiation Oncology

## 2022-06-19 DIAGNOSIS — Z510 Encounter for antineoplastic radiation therapy: Secondary | ICD-10-CM | POA: Diagnosis not present

## 2022-06-19 DIAGNOSIS — C3411 Malignant neoplasm of upper lobe, right bronchus or lung: Secondary | ICD-10-CM | POA: Diagnosis not present

## 2022-06-19 DIAGNOSIS — C3491 Malignant neoplasm of unspecified part of right bronchus or lung: Secondary | ICD-10-CM

## 2022-06-19 DIAGNOSIS — C3412 Malignant neoplasm of upper lobe, left bronchus or lung: Secondary | ICD-10-CM | POA: Diagnosis not present

## 2022-06-19 LAB — RAD ONC ARIA SESSION SUMMARY
Course Elapsed Days: 0
Plan Fractions Treated to Date: 1
Plan Fractions Treated to Date: 1
Plan Prescribed Dose Per Fraction: 18 Gy
Plan Prescribed Dose Per Fraction: 18 Gy
Plan Total Fractions Prescribed: 3
Plan Total Fractions Prescribed: 3
Plan Total Prescribed Dose: 54 Gy
Plan Total Prescribed Dose: 54 Gy
Reference Point Dosage Given to Date: 18 Gy
Reference Point Dosage Given to Date: 18 Gy
Reference Point Session Dosage Given: 18 Gy
Reference Point Session Dosage Given: 18 Gy
Session Number: 1

## 2022-06-20 ENCOUNTER — Ambulatory Visit: Payer: Medicare Other | Admitting: Radiation Oncology

## 2022-06-20 ENCOUNTER — Ambulatory Visit (INDEPENDENT_AMBULATORY_CARE_PROVIDER_SITE_OTHER): Payer: Medicare Other | Admitting: Family

## 2022-06-20 ENCOUNTER — Encounter: Payer: Self-pay | Admitting: Family

## 2022-06-20 VITALS — BP 133/74 | HR 93 | Temp 97.3°F | Ht 66.0 in | Wt 146.0 lb

## 2022-06-20 DIAGNOSIS — M1A3420 Chronic gout due to renal impairment, left hand, without tophus (tophi): Secondary | ICD-10-CM

## 2022-06-20 DIAGNOSIS — E1159 Type 2 diabetes mellitus with other circulatory complications: Secondary | ICD-10-CM | POA: Diagnosis not present

## 2022-06-20 DIAGNOSIS — F5101 Primary insomnia: Secondary | ICD-10-CM | POA: Diagnosis not present

## 2022-06-20 DIAGNOSIS — M159 Polyosteoarthritis, unspecified: Secondary | ICD-10-CM

## 2022-06-20 DIAGNOSIS — M150 Primary generalized (osteo)arthritis: Secondary | ICD-10-CM

## 2022-06-20 DIAGNOSIS — E1169 Type 2 diabetes mellitus with other specified complication: Secondary | ICD-10-CM

## 2022-06-20 DIAGNOSIS — I5033 Acute on chronic diastolic (congestive) heart failure: Secondary | ICD-10-CM

## 2022-06-20 DIAGNOSIS — K219 Gastro-esophageal reflux disease without esophagitis: Secondary | ICD-10-CM

## 2022-06-20 DIAGNOSIS — E039 Hypothyroidism, unspecified: Secondary | ICD-10-CM | POA: Diagnosis not present

## 2022-06-20 DIAGNOSIS — N138 Other obstructive and reflux uropathy: Secondary | ICD-10-CM

## 2022-06-20 DIAGNOSIS — C3491 Malignant neoplasm of unspecified part of right bronchus or lung: Secondary | ICD-10-CM

## 2022-06-20 DIAGNOSIS — J449 Chronic obstructive pulmonary disease, unspecified: Secondary | ICD-10-CM

## 2022-06-20 DIAGNOSIS — I739 Peripheral vascular disease, unspecified: Secondary | ICD-10-CM | POA: Diagnosis not present

## 2022-06-20 DIAGNOSIS — F17210 Nicotine dependence, cigarettes, uncomplicated: Secondary | ICD-10-CM | POA: Diagnosis not present

## 2022-06-20 DIAGNOSIS — N401 Enlarged prostate with lower urinary tract symptoms: Secondary | ICD-10-CM

## 2022-06-20 DIAGNOSIS — I110 Hypertensive heart disease with heart failure: Secondary | ICD-10-CM

## 2022-06-20 DIAGNOSIS — Z720 Tobacco use: Secondary | ICD-10-CM

## 2022-06-20 DIAGNOSIS — E782 Mixed hyperlipidemia: Secondary | ICD-10-CM

## 2022-06-20 DIAGNOSIS — E1142 Type 2 diabetes mellitus with diabetic polyneuropathy: Secondary | ICD-10-CM

## 2022-06-20 LAB — BAYER DCA HB A1C WAIVED: HB A1C (BAYER DCA - WAIVED): 8.5 % — ABNORMAL HIGH (ref 4.8–5.6)

## 2022-06-20 MED ORDER — TRELEGY ELLIPTA 100-62.5-25 MCG/ACT IN AEPB: 1.0000 | INHALATION_SPRAY | Freq: Every day | RESPIRATORY_TRACT | 11 refills | Status: DC

## 2022-06-20 MED ORDER — ZOLPIDEM TARTRATE 5 MG PO TABS: 5.0000 mg | ORAL_TABLET | Freq: Every evening | ORAL | 2 refills | Status: DC | PRN

## 2022-06-20 MED ORDER — ALLOPURINOL 100 MG PO TABS: 100.0000 mg | ORAL_TABLET | Freq: Every day | ORAL | 6 refills | Status: DC

## 2022-06-20 NOTE — Progress Notes (Signed)
Subjective:    Patient ID: John [name], male    DOB: 1945/10/06, 77 y.o.   MRN: [id number]  Chief Complaint  Patient presents with   Medical Management of Chronic Issues   Pt presents to the office today for  chronic care follow up. He is followed by Cardiologists  for CHF. Followed by Oncologists every 3 months for non-small lung cancer. He is currently taking radiation.     He is followed by Pulmonologist every 6 months for COPD. He continues to smoke 1/2 and complaining of increased SOB.  He is followed by Vascular for PAD.    He is followed by Urologists for BPH and Prostate.   He has seen GI for bleed, but stable now.     Has OSA and using CPAP nightly.    Hypertension This is a chronic problem. The current episode started more than 1 year ago. The problem has been resolved since onset. The problem is controlled. Associated symptoms include malaise/fatigue and shortness of breath. Pertinent negatives include no blurred vision or peripheral edema. Risk factors for coronary artery disease include obesity, dyslipidemia and male gender. The current treatment provides moderate improvement. Hypertensive end-organ damage includes kidney disease and heart failure.  Diabetes He presents for his follow-up diabetic visit. He has type 2 diabetes mellitus. Associated symptoms include fatigue and foot paresthesias. Pertinent negatives for diabetes include no blurred vision. Diabetic complications include peripheral neuropathy. Risk factors for coronary artery disease include dyslipidemia, diabetes mellitus, hypertension, sedentary lifestyle and post-menopausal. He is following a generally unhealthy diet. His bedtime blood glucose range is generally 140-180 mg/dl.  Congestive Heart Failure Presents for follow-up visit. Associated symptoms include edema, fatigue, nocturia and shortness of breath. The symptoms have been stable.  Gastroesophageal Reflux He complains of belching, heartburn and a  hoarse voice. This is a chronic problem. The current episode started more than 1 year ago. The problem occurs occasionally. Associated symptoms include fatigue. He has tried a PPI for the symptoms. The treatment provided moderate relief.  Hyperlipidemia This is a chronic problem. The current episode started more than 1 year ago. Exacerbating diseases include obesity. Associated symptoms include shortness of breath. Current antihyperlipidemic treatment includes statins. The current treatment provides moderate improvement of lipids. Risk factors for coronary artery disease include dyslipidemia, hypertension, male sex, a sedentary lifestyle and diabetes mellitus.  Benign Prostatic Hypertrophy This is a chronic problem. The current episode started more than 1 year ago. Irritative symptoms include nocturia and urgency. The treatment provided moderate relief.  Insomnia Primary symptoms: difficulty falling asleep, malaise/fatigue.   The current episode started more than one year. The onset quality is gradual. The problem occurs intermittently. Past treatments include meditation. The treatment provided moderate relief.      Review of Systems  Constitutional:  Positive for fatigue and malaise/fatigue.  HENT:  Positive for hoarse voice.   Eyes:  Negative for blurred vision.  Respiratory:  Positive for shortness of breath.   Gastrointestinal:  Positive for heartburn.  Genitourinary:  Positive for nocturia and urgency.  Psychiatric/Behavioral:  The patient has insomnia.   All other systems reviewed and are negative.      Objective:   Physical Exam Vitals reviewed.  Constitutional:      General: He is not in acute distress.    Appearance: He is well-developed.  HENT:     Head: Normocephalic.     Right Ear: Tympanic membrane normal.     Left Ear: Tympanic membrane normal.  Eyes:  General:        Right eye: No discharge.        Left eye: No discharge.     Pupils: Pupils are equal, round,  and reactive to light.  Neck:     Thyroid: No thyromegaly.  Cardiovascular:     Rate and Rhythm: Normal rate and regular rhythm.     Heart sounds: Normal heart sounds. No murmur heard. Pulmonary:     Effort: Pulmonary effort is normal. No respiratory distress.     Breath sounds: No wheezing.     Comments: diminished Abdominal:     General: Bowel sounds are normal. There is no distension.     Palpations: Abdomen is soft.     Tenderness: There is no abdominal tenderness.  Musculoskeletal:        General: No tenderness.     Cervical back: Normal range of motion and neck supple.     Comments: Pain in lumbar and knees   Skin:    General: Skin is warm and dry.     Findings: No erythema or rash.  Neurological:     Mental Status: He is alert and oriented to person, place, and time.     Cranial Nerves: No cranial nerve deficit.     Motor: Weakness present.     Gait: Gait abnormal (using cane).     Deep Tendon Reflexes: Reflexes are normal and symmetric.  Psychiatric:        Behavior: Behavior normal.        Thought Content: Thought content normal.        Judgment: Judgment normal.       BP 133/74   Pulse 93   Temp (!) 97.3 F (36.3 C) (Temporal)   Ht 5\' 6"  (1.676 m)   Wt 146 lb (66.2 kg)   SpO2 100%   BMI 23.57 kg/m      Assessment & Plan:   John [name] comes in today with chief complaint of Medical Management of Chronic Issues   Diagnosis and orders addressed:  1. Primary insomnia - zolpidem (AMBIEN) 5 MG tablet; Take 1 tablet (5 mg total) by mouth at bedtime as needed for sleep.  Dispense: 30 tablet; Refill: 2  2. Hypertension associated with diabetes ([place])  3. Chronic obstructive pulmonary disease, unspecified COPD type ([place]) - Fluticasone-Umeclidin-Vilant (TRELEGY ELLIPTA) 100-62.5-25 MCG/ACT AEPB; Inhale 1 puff into the lungs daily.  Dispense: 1 each; Refill: 11 - Ambulatory referral to Pulmonology  4. BPH with urinary obstruction  5. Non-small cell  lung cancer, right ([place])  6. Acute on chronic diastolic CHF (congestive heart failure) ([place])   7. Type 2 diabetes mellitus with diabetic polyneuropathy, without long-term current use of insulin ([facility]) - Bayer DCA Hb A1c Waived  8. Hypothyroidism, unspecified type  9. Nicotine dependence, cigarettes, uncomplicated  10. Primary osteoarthritis involving multiple joints  11. GERD without esophagitis  12. COPD GOLD III/ group D still smoking  - Ambulatory referral to Pulmonology  13. PAD (peripheral artery disease) ([place])  14. Mixed diabetic hyperlipidemia associated with type 2 diabetes mellitus ([place])  15. Tobacco abuse  16. Chronic gout of left hand due to renal impairment without tophus Start allopurinol 100 mg  Low purine diet  - allopurinol (ZYLOPRIM) 100 MG tablet; Take 1 tablet (100 mg total) by mouth daily.  Dispense: 30 tablet; Refill: 6   Labs pending Health Maintenance reviewed Diet and exercise encouraged  Follow up plan: 3 months    Evelina Dun, FNP

## 2022-06-20 NOTE — Patient Instructions (Signed)
Gout  Gout is a condition that causes painful swelling of the joints. Gout is a type of inflammation of the joints (arthritis). This condition is caused by having too much uric acid in the body. Uric acid is a chemical that forms when the body breaks down substances called purines. Purines are important for building body proteins. When the body has too much uric acid, sharp crystals can form and build up inside the joints. This causes pain and swelling. Gout attacks can happen quickly and may be very painful (acute gout). Over time, the attacks can affect more joints and become more frequent (chronic gout). Gout can also cause uric acid to build up under the skin and inside the kidneys. What are the causes? This condition is caused by too much uric acid in your blood. This can happen because: Your kidneys do not remove enough uric acid from your blood. This is the most common cause. Your body makes too much uric acid. This can happen with some cancers and cancer treatments. It can also occur if your body is breaking down too many red blood cells (hemolytic anemia). You eat too many foods that are high in purines. These foods include organ meats and some seafood. Alcohol, especially beer, is also high in purines. A gout attack may be triggered by trauma or stress. What increases the risk? The following factors may make you more likely to develop this condition: Having a family history of gout. Being male and middle-aged. Being male and having gone through menopause. Taking certain medicines, including aspirin, cyclosporine, diuretics, levodopa, and niacin. Having an organ transplant. Having certain conditions, such as: Being obese. Lead poisoning. Kidney disease. A skin condition called psoriasis. Other factors include: Losing weight too quickly. Being dehydrated. Frequently drinking alcohol, especially beer. Frequently drinking beverages that are sweetened with a type of sugar called  fructose. What are the signs or symptoms? An attack of acute gout happens quickly. It usually occurs in just one joint. The most common place is the big toe. Attacks often start at night. Other joints that may be affected include joints of the feet, ankle, knee, fingers, wrist, or elbow. Symptoms of this condition may include: Severe pain. Warmth. Swelling. Stiffness. Tenderness. The affected joint may be very painful to touch. Shiny, red, or purple skin. Chills and fever. Chronic gout may cause symptoms more frequently. More joints may be involved. You may also have white or yellow lumps (tophi) on your hands or feet or in other areas near your joints. How is this diagnosed? This condition is diagnosed based on your symptoms, your medical history, and a physical exam. You may have tests, such as: Blood tests to measure uric acid levels. Removal of joint fluid with a thin needle (aspiration) to look for uric acid crystals. X-rays to look for joint damage. How is this treated? Treatment for this condition has two phases: treating an acute attack and preventing future attacks. Acute gout treatment may include medicines to reduce pain and swelling, including: NSAIDs, such as ibuprofen. Steroids. These are strong anti-inflammatory medicines that can be taken by mouth (orally) or injected into a joint. Colchicine. This medicine relieves pain and swelling when it is taken soon after an attack. It can be given by mouth or through an IV. Preventive treatment may include: Daily use of smaller doses of NSAIDs or colchicine. Use of a medicine that reduces uric acid levels in your blood, such as allopurinol. Changes to your diet. You may need to see   a dietitian about what to eat and drink to prevent gout. Follow these instructions at home: During a gout attack  If directed, put ice on the affected area. To do this: Put ice in a plastic bag. Place a towel between your skin and the bag. Leave the  ice on for 20 minutes, 2-3 times a day. Remove the ice if your skin turns bright red. This is very important. If you cannot feel pain, heat, or cold, you have a greater risk of damage to the area. Raise (elevate) the affected joint above the level of your heart as often as possible. Rest the joint as much as possible. If the affected joint is in your leg, you may be given crutches to use. Follow instructions from your health care provider about eating or drinking restrictions. Avoiding future gout attacks Follow a low-purine diet as told by your dietitian or health care provider. Avoid foods and drinks that are high in purines, including liver, kidney, anchovies, asparagus, herring, mushrooms, mussels, and beer. Maintain a healthy weight or lose weight if you are overweight. If you want to lose weight, talk with your health care provider. Do not lose weight too quickly. Start or maintain an exercise program as told by your health care provider. Eating and drinking Avoid drinking beverages that contain fructose. Drink enough fluids to keep your urine pale yellow. If you drink alcohol: Limit how much you have to: 0-1 drink a day for women who are not pregnant. 0-2 drinks a day for men. Know how much alcohol is in a drink. In the [place] one drink equals one 12 oz bottle of beer (355 mL), one 5 oz glass of wine (148 mL), or one 1 oz glass of hard liquor (44 mL). General instructions Take over-the-counter and prescription medicines only as told by your health care provider. Ask your health care provider if the medicine prescribed to you requires you to avoid driving or using machinery. Return to your normal activities as told by your health care provider. Ask your health care provider what activities are safe for you. Keep all follow-up visits. This is important. Where to find more information [employer] of [employer]: www.niams.nih.gov Contact a health care provider if you have: Another  gout attack. Continuing symptoms of a gout attack after 10 days of treatment. Side effects from your medicines. Chills or a fever. Burning pain when you urinate. Pain in your lower back or abdomen. Get help right away if you: Have severe or uncontrolled pain. Cannot urinate. Summary Gout is painful swelling of the joints caused by having too much uric acid in the body. The most common site for gout to occur is in the big toe, but it can affect other joints in the body. Medicines and dietary changes can help to prevent and treat gout attacks. This information is not intended to replace advice given to you by your health care provider. Make sure you discuss any questions you have with your health care provider. Document Revised: 12/26/2020 Document Reviewed: 12/26/2020 [employer] Patient Education  2023 [employer]  

## 2022-06-23 ENCOUNTER — Ambulatory Visit: Payer: Medicare Other | Admitting: Radiation Oncology

## 2022-06-24 ENCOUNTER — Ambulatory Visit
Admission: RE | Admit: 2022-06-24 | Discharge: 2022-06-24 | Disposition: A | Discharge status: Home / Self Care | Payer: Medicare Other | Source: Ambulatory Visit | Attending: Radiation Oncology | Admitting: Radiation Oncology

## 2022-06-24 ENCOUNTER — Telehealth: Payer: Self-pay | Admitting: Family Medicine

## 2022-06-24 ENCOUNTER — Other Ambulatory Visit: Payer: Self-pay

## 2022-06-24 ENCOUNTER — Other Ambulatory Visit: Payer: Self-pay | Admitting: Family

## 2022-06-24 DIAGNOSIS — C3411 Malignant neoplasm of upper lobe, right bronchus or lung: Secondary | ICD-10-CM | POA: Diagnosis not present

## 2022-06-24 DIAGNOSIS — Z510 Encounter for antineoplastic radiation therapy: Secondary | ICD-10-CM | POA: Diagnosis not present

## 2022-06-24 DIAGNOSIS — C3491 Malignant neoplasm of unspecified part of right bronchus or lung: Secondary | ICD-10-CM

## 2022-06-24 DIAGNOSIS — C3412 Malignant neoplasm of upper lobe, left bronchus or lung: Secondary | ICD-10-CM | POA: Diagnosis not present

## 2022-06-24 LAB — RAD ONC ARIA SESSION SUMMARY
Course Elapsed Days: 5
Plan Fractions Treated to Date: 2
Plan Fractions Treated to Date: 2
Plan Prescribed Dose Per Fraction: 18 Gy
Plan Prescribed Dose Per Fraction: 18 Gy
Plan Total Fractions Prescribed: 3
Plan Total Fractions Prescribed: 3
Plan Total Prescribed Dose: 54 Gy
Plan Total Prescribed Dose: 54 Gy
Reference Point Dosage Given to Date: 36 Gy
Reference Point Dosage Given to Date: 36 Gy
Reference Point Session Dosage Given: 18 Gy
Reference Point Session Dosage Given: 18 Gy
Session Number: 2

## 2022-06-24 MED ORDER — DAPAGLIFLOZIN PROPANEDIOL 5 MG PO TABS: 5.0000 mg | ORAL_TABLET | Freq: Every day | ORAL | 1 refills | Status: DC

## 2022-06-24 NOTE — Progress Notes (Signed)
R/c

## 2022-06-24 NOTE — Telephone Encounter (Signed)
Patient Advocate Encounter  Prior Authorization for Zolpidem 5mg  has been approved.    PA# : [id number] Effective dates: 06/10/22 until further notice

## 2022-06-24 NOTE — Telephone Encounter (Signed)
Sent to plan today  

## 2022-06-26 ENCOUNTER — Telehealth: Payer: Self-pay | Admitting: Family

## 2022-06-26 ENCOUNTER — Other Ambulatory Visit: Payer: Self-pay

## 2022-06-26 ENCOUNTER — Ambulatory Visit
Admission: RE | Admit: 2022-06-26 | Discharge: 2022-06-26 | Disposition: A | Discharge status: Home / Self Care | Payer: Medicare Other | Source: Ambulatory Visit | Attending: Radiation Oncology | Admitting: Radiation Oncology

## 2022-06-26 ENCOUNTER — Ambulatory Visit: Payer: Medicare Other

## 2022-06-26 DIAGNOSIS — Z510 Encounter for antineoplastic radiation therapy: Secondary | ICD-10-CM | POA: Diagnosis not present

## 2022-06-26 DIAGNOSIS — C3411 Malignant neoplasm of upper lobe, right bronchus or lung: Secondary | ICD-10-CM | POA: Diagnosis not present

## 2022-06-26 DIAGNOSIS — C3412 Malignant neoplasm of upper lobe, left bronchus or lung: Secondary | ICD-10-CM | POA: Diagnosis not present

## 2022-06-26 DIAGNOSIS — C3491 Malignant neoplasm of unspecified part of right bronchus or lung: Secondary | ICD-10-CM

## 2022-06-26 LAB — RAD ONC ARIA SESSION SUMMARY
Course Elapsed Days: 7
Plan Fractions Treated to Date: 3
Plan Fractions Treated to Date: 3
Plan Prescribed Dose Per Fraction: 18 Gy
Plan Prescribed Dose Per Fraction: 18 Gy
Plan Total Fractions Prescribed: 3
Plan Total Fractions Prescribed: 3
Plan Total Prescribed Dose: 54 Gy
Plan Total Prescribed Dose: 54 Gy
Reference Point Dosage Given to Date: 54 Gy
Reference Point Dosage Given to Date: 54 Gy
Reference Point Session Dosage Given: 18 Gy
Reference Point Session Dosage Given: 18 Gy
Session Number: 3

## 2022-06-26 NOTE — Telephone Encounter (Signed)
Yes, take both.   Evelina Dun, FNP

## 2022-06-26 NOTE — Telephone Encounter (Signed)
Lmtcb.

## 2022-06-26 NOTE — Telephone Encounter (Signed)
Pt wants to know if he can take dapagliflozin propanediol (FARXIGA) 5 MG TABS tablet with Semaglutide (RYBELSUS) 14 MG TABS. Please call back

## 2022-06-27 NOTE — Telephone Encounter (Signed)
Patient aware and verbalized understanding. °

## 2022-06-27 NOTE — Radiation Completion Notes (Signed)
Patient Name: John [name], John [name] MRN: [id number] Date of Birth: [birth year] Referring Physician: Derek Jack, M.D. Date of Service: 2022-06-27 Radiation Oncologist: Teryl Lucy, M.D. [place] END OF TREATMENT NOTE     Diagnosis: C34.12 Malignant neoplasm of upper lobe, left bronchus or lung; C34.11 Malignant neoplasm of upper lobe, right bronchus or lung Staging on 2016-06-05: Non-small cell lung cancer, right (HCC) T=cT1b, N=cN2, M=cM0 Intent: Curative     ==========DELIVERED PLANS==========  First Treatment Date: 2022-06-19 - Last Treatment Date: 2022-06-26   Plan Name: Lung_Lt_SBRT Site: Lung, Left Technique: SBRT/SRT-IMRT Mode: Photon Dose Per Fraction: 18 Gy Prescribed Dose (Delivered / Prescribed): 54 Gy / 54 Gy Prescribed Fxs (Delivered / Prescribed): 3 / 3   Plan Name: Lung_Rt_SBRT Site: Lung, Right Technique: SBRT/SRT-IMRT Mode: Photon Dose Per Fraction: 18 Gy Prescribed Dose (Delivered / Prescribed): 54 Gy / 54 Gy Prescribed Fxs (Delivered / Prescribed): 3 / 3     ==========ON TREATMENT VISIT DATES========== 2022-06-19, 2022-06-19, 2022-06-24, 2022-06-24, 2022-06-24, 2022-06-26, 2022-06-26     ==========UPCOMING VISITS==========       ==========APPENDIX - ON TREATMENT VISIT NOTES==========   See weekly On Treatment Notes is Epic for details.

## 2022-06-30 ENCOUNTER — Other Ambulatory Visit: Payer: Self-pay | Admitting: Family

## 2022-07-01 ENCOUNTER — Other Ambulatory Visit: Payer: Self-pay | Admitting: Family

## 2022-07-03 ENCOUNTER — Ambulatory Visit (INDEPENDENT_AMBULATORY_CARE_PROVIDER_SITE_OTHER): Payer: Medicare Other

## 2022-07-03 DIAGNOSIS — I4892 Unspecified atrial flutter: Secondary | ICD-10-CM | POA: Diagnosis not present

## 2022-07-03 LAB — CUP PACEART REMOTE DEVICE CHECK
Date Time Interrogation Session: 20240328053600
Implantable Pulse Generator Implant Date: 20240126
Pulse Gen Serial Number: 104324

## 2022-07-07 DIAGNOSIS — H353131 Nonexudative age-related macular degeneration, bilateral, early dry stage: Secondary | ICD-10-CM | POA: Diagnosis not present

## 2022-07-07 DIAGNOSIS — Z961 Presence of intraocular lens: Secondary | ICD-10-CM | POA: Diagnosis not present

## 2022-07-07 DIAGNOSIS — E119 Type 2 diabetes mellitus without complications: Secondary | ICD-10-CM | POA: Diagnosis not present

## 2022-07-07 DIAGNOSIS — H43812 Vitreous degeneration, left eye: Secondary | ICD-10-CM | POA: Diagnosis not present

## 2022-07-07 DIAGNOSIS — H26492 Other secondary cataract, left eye: Secondary | ICD-10-CM | POA: Diagnosis not present

## 2022-07-11 NOTE — Progress Notes (Signed)
[employer] 

## 2022-07-15 ENCOUNTER — Telehealth: Payer: Self-pay | Admitting: Family

## 2022-07-15 NOTE — Telephone Encounter (Signed)
Appt made patient aware  °

## 2022-07-17 ENCOUNTER — Ambulatory Visit: Payer: Medicare Other | Admitting: Family

## 2022-07-17 ENCOUNTER — Ambulatory Visit (INDEPENDENT_AMBULATORY_CARE_PROVIDER_SITE_OTHER)
Admission: RE | Admit: 2022-07-17 | Discharge: 2022-07-17 | Disposition: A | Discharge status: Home / Self Care | Payer: Medicare Other | Source: Ambulatory Visit | Attending: Vascular Surgery | Admitting: Vascular Surgery

## 2022-07-17 ENCOUNTER — Ambulatory Visit (HOSPITAL_COMMUNITY)
Admission: RE | Admit: 2022-07-17 | Discharge: 2022-07-17 | Disposition: A | Discharge status: Home / Self Care | Payer: Medicare Other | Source: Ambulatory Visit | Attending: Vascular Surgery | Admitting: Vascular Surgery

## 2022-07-17 DIAGNOSIS — I739 Peripheral vascular disease, unspecified: Secondary | ICD-10-CM

## 2022-07-17 LAB — VAS US ABI WITH/WO TBI
Left ABI: 1.11
Right ABI: 1.15

## 2022-07-18 ENCOUNTER — Telehealth: Payer: Self-pay | Admitting: Family

## 2022-07-18 ENCOUNTER — Ambulatory Visit (INDEPENDENT_AMBULATORY_CARE_PROVIDER_SITE_OTHER): Payer: Medicare Other | Admitting: Family

## 2022-07-18 ENCOUNTER — Encounter: Payer: Self-pay | Admitting: Family

## 2022-07-18 VITALS — BP 131/67 | HR 111 | Temp 97.4°F | Ht 66.0 in | Wt 148.0 lb

## 2022-07-18 DIAGNOSIS — K5900 Constipation, unspecified: Secondary | ICD-10-CM

## 2022-07-18 DIAGNOSIS — C3491 Malignant neoplasm of unspecified part of right bronchus or lung: Secondary | ICD-10-CM

## 2022-07-18 DIAGNOSIS — J301 Allergic rhinitis due to pollen: Secondary | ICD-10-CM | POA: Diagnosis not present

## 2022-07-18 DIAGNOSIS — J441 Chronic obstructive pulmonary disease with (acute) exacerbation: Secondary | ICD-10-CM

## 2022-07-18 MED ORDER — CETIRIZINE HCL 10 MG PO TABS: 10.0000 mg | ORAL_TABLET | Freq: Every day | ORAL | 1 refills | Status: DC

## 2022-07-18 MED ORDER — POLYETHYLENE GLYCOL 3350 17 GM/SCOOP PO POWD: 17.0000 g | Freq: Two times a day (BID) | ORAL | 1 refills | Status: DC | PRN

## 2022-07-18 MED ORDER — PREDNISONE 20 MG PO TABS: 40.0000 mg | ORAL_TABLET | Freq: Every day | ORAL | 0 refills | Status: AC

## 2022-07-18 NOTE — Patient Instructions (Signed)

## 2022-07-18 NOTE — Telephone Encounter (Signed)
Patient said insurance does not cover  cetirizine (ZYRTEC ALLERGY) 10 MG tablet and wants to know if something else can be called in instead. Please send to [facility] in [place]

## 2022-07-18 NOTE — Progress Notes (Signed)
Subjective:    Patient ID: John [name], male    DOB: 1945-05-02, 77 y.o.   MRN: [id number]  Chief Complaint  Patient presents with   Constipation   Nausea   Cough    Chest congestion a lot of mucus coming out for weeks   Pt presents to the office today with constipation. Reports he has not been in 4 days. He has been taking Lizness 72 mcg. He stopped his iron.   He also complaining of cough. He has Right NSCL and is currently getting radiation.  Constipation This is a chronic problem. The current episode started more than 1 year ago. The problem has been waxing and waning since onset. Associated symptoms include nausea and vomiting. Pertinent negatives include no diarrhea, fever, hematochezia or hemorrhoids. He has tried fiber and laxatives for the symptoms. The treatment provided mild relief.  Cough This is a new problem. The current episode started 1 to 4 weeks ago. The problem has been gradually worsening. The problem occurs every few minutes. The cough is Productive of sputum. Associated symptoms include shortness of breath and wheezing. Pertinent negatives include no chills, ear congestion, ear pain, fever, headaches, myalgias or nasal congestion. Risk factors for lung disease include smoking/tobacco exposure. He has tried rest for the symptoms. The treatment provided mild relief. His past medical history is significant for emphysema.      Review of Systems  Constitutional:  Negative for chills and fever.  HENT:  Negative for ear pain.   Respiratory:  Positive for cough, shortness of breath and wheezing.   Gastrointestinal:  Positive for constipation, nausea and vomiting. Negative for diarrhea, hematochezia and hemorrhoids.  Musculoskeletal:  Negative for myalgias.  Neurological:  Negative for headaches.  All other systems reviewed and are negative.      Objective:   Physical Exam Vitals reviewed.  Constitutional:      General: He is not in acute distress.     Appearance: He is well-developed.  HENT:     Head: Normocephalic.     Right Ear: Tympanic membrane normal.     Left Ear: Tympanic membrane normal.  Eyes:     General:        Right eye: No discharge.        Left eye: No discharge.     Pupils: Pupils are equal, round, and reactive to light.  Neck:     Thyroid: No thyromegaly.  Cardiovascular:     Rate and Rhythm: Normal rate and regular rhythm.     Heart sounds: Normal heart sounds. No murmur heard. Pulmonary:     Effort: Pulmonary effort is normal. No respiratory distress.     Breath sounds: Rhonchi present. No wheezing.  Abdominal:     General: Bowel sounds are normal. There is no distension.     Palpations: Abdomen is soft.     Tenderness: There is no abdominal tenderness.  Musculoskeletal:        General: No tenderness. Normal range of motion.     Cervical back: Normal range of motion and neck supple.  Skin:    General: Skin is warm and dry.     Findings: No erythema or rash.  Neurological:     Mental Status: He is alert and oriented to person, place, and time.     Cranial Nerves: No cranial nerve deficit.     Deep Tendon Reflexes: Reflexes are normal and symmetric.  Psychiatric:        Behavior: Behavior normal.  Thought Content: Thought content normal.        Judgment: Judgment normal.     BP 131/67   Pulse (!) 111   Temp (!) 97.4 F (36.3 C) (Temporal)   Ht 5\' 6"  (1.676 m)   Wt 148 lb (67.1 kg)   SpO2 91%   BMI 23.89 kg/m      Assessment & Plan:  John [name] comes in today with chief complaint of Constipation, Nausea, and Cough (Chest congestion a lot of mucus coming out for weeks)   Diagnosis and orders addressed:  1. Constipation, unspecified constipation type :Continue Linzess Start Miralax  Force fluids Increase fiber Continue hold iron until constipation resolves, iron was stable - polyethylene glycol powder (GLYCOLAX/MIRALAX) 17 GM/SCOOP powder; Take 17 g by mouth 2 (two) times daily  as needed.  Dispense: 3350 g; Refill: 1  2. COPD exacerbation Start prednisone and zyrtec - predniSONE (DELTASONE) 20 MG tablet; Take 2 tablets (40 mg total) by mouth daily with breakfast for 5 days.  Dispense: 10 tablet; Refill: 0  3. Non-small cell lung cancer, right Keep follow up with specialists   4. Allergic rhinitis due to pollen, unspecified seasonality - cetirizine (ZYRTEC ALLERGY) 10 MG tablet; Take 1 tablet (10 mg total) by mouth daily.  Dispense: 90 tablet; Refill: 1    Health Maintenance reviewed Diet and exercise encouraged  Follow up plan: Keep chronic follow up   Jannifer Rodney, FNP

## 2022-07-18 NOTE — Telephone Encounter (Signed)
Patient aware can get otc

## 2022-07-22 ENCOUNTER — Ambulatory Visit (INDEPENDENT_AMBULATORY_CARE_PROVIDER_SITE_OTHER): Payer: Medicare Other | Admitting: Vascular Surgery

## 2022-07-22 ENCOUNTER — Encounter: Payer: Self-pay | Admitting: Vascular Surgery

## 2022-07-22 VITALS — BP 106/68 | HR 112 | Temp 97.8°F | Resp 16 | Ht 66.0 in | Wt 145.0 lb

## 2022-07-22 DIAGNOSIS — I739 Peripheral vascular disease, unspecified: Secondary | ICD-10-CM

## 2022-07-22 DIAGNOSIS — E1142 Type 2 diabetes mellitus with diabetic polyneuropathy: Secondary | ICD-10-CM | POA: Diagnosis not present

## 2022-07-22 DIAGNOSIS — B351 Tinea unguium: Secondary | ICD-10-CM | POA: Diagnosis not present

## 2022-07-22 DIAGNOSIS — M79676 Pain in unspecified toe(s): Secondary | ICD-10-CM | POA: Diagnosis not present

## 2022-07-22 DIAGNOSIS — L84 Corns and callosities: Secondary | ICD-10-CM | POA: Diagnosis not present

## 2022-07-22 NOTE — Progress Notes (Signed)
Patient name: John [name] MRN: [id number] DOB: 1945/05/23 Sex: male  REASON FOR VISIT: 6 month follow-up right leg PAD  HPI: John [name] is a 77 y.o. male with multiple medical comorbidities including hypertension, hyperlipidemia, diabetes, COPD, CHF that presents for 6 month follow-up of right leg PAD.  No significant complaints with his right leg.  Walking with a cane and states he can go to his mailbox and outside without any significant calf cramping.  Is back on an aspirin now.  Initially underwent right common iliac stent with a right femoral endarterectomy on 08/20/2020 for short distance lifestyle limiting claudication.  He then developed recurrent symptoms in the right leg.  On 05/30/2021 he underwent right proximal common iliac artery angioplasty with drug-coated balloon, multiple right SFA and above-knee popliteal angioplasty with drug-coated balloon.    Past Medical History:  Diagnosis Date   Adenocarcinoma of lung, right 04/18/2016   Anemia    Anxiety    Arthritis    Asthma    Atrial flutter    BPH (benign prostatic hyperplasia)    with urinary retention 02/06/20   CHF (congestive heart failure)    COPD (chronic obstructive pulmonary disease)    Depression    Diabetes mellitus, type II    Dyspnea    History of kidney stones    History of radiation therapy    right lung 09/10/2020-09/17/2020   Dr Roselind Messier   Hyperlipidemia    Hypertension    Hypothyroidism    Macular degeneration    Neuropathy    Non-small cell lung cancer, right 04/18/2016   Peripheral vascular disease    Prostatitis    Pulmonary nodule, left 07/16/2016   Sleep apnea    cpap    Past Surgical History:  Procedure Laterality Date   A-FLUTTER ABLATION N/A 01/13/2022   Procedure: A-FLUTTER ABLATION;  Surgeon: Lanier Prude, MD;  Location: [facility] INVASIVE CV LAB;  Service: Cardiovascular;  Laterality: N/A;   ABDOMINAL AORTOGRAM W/LOWER EXTREMITY Left 02/06/2020   Procedure: ABDOMINAL AORTOGRAM  W/LOWER EXTREMITY;  Surgeon: Runell Gess, MD;  Location: [facility] INVASIVE CV LAB;  Service: Cardiovascular;  Laterality: Left;   ABDOMINAL AORTOGRAM W/LOWER EXTREMITY N/A 05/30/2021   Procedure: ABDOMINAL AORTOGRAM W/LOWER EXTREMITY;  Surgeon: Cephus Shelling, MD;  Location: [facility] INVASIVE CV LAB;  Service: Cardiovascular;  Laterality: N/A;   BIOPSY  09/16/2021   Procedure: BIOPSY;  Surgeon: Iva Boop, MD;  Location: [facility] ENDOSCOPY;  Service: Gastroenterology;;   CARDIOVERSION N/A 10/05/2020   Procedure: CARDIOVERSION;  Surgeon: Sande Rives, MD;  Location: [facility] ENDOSCOPY;  Service: Cardiovascular;  Laterality: N/A;   CATARACT EXTRACTION, BILATERAL Bilateral    COLONOSCOPY WITH PROPOFOL N/A 06/20/2021   Procedure: COLONOSCOPY WITH PROPOFOL;  Surgeon: Hilarie Fredrickson, MD;  Location: [facility] ENDOSCOPY;  Service: Endoscopy;  Laterality: N/A;   COLONOSCOPY WITH PROPOFOL N/A 09/16/2021   Procedure: COLONOSCOPY WITH PROPOFOL;  Surgeon: Iva Boop, MD;  Location: [facility] ENDOSCOPY;  Service: Gastroenterology;  Laterality: N/A;   ENDARTERECTOMY FEMORAL Right 08/20/2020   Procedure: ENDARTERECTOMY  RIGHT FEMORAL ARTERY;  Surgeon: Cephus Shelling, MD;  Location: [facility] OR;  Service: Vascular;  Laterality: Right;   ENTEROSCOPY N/A 10/11/2021   Procedure: ENTEROSCOPY;  Surgeon: Dolores Frame, MD;  Location: [facility] ENDO SUITE;  Service: Gastroenterology;  Laterality: N/A;   ESOPHAGOGASTRODUODENOSCOPY N/A 09/16/2021   Procedure: ESOPHAGOGASTRODUODENOSCOPY (EGD);  Surgeon: Iva Boop, MD;  Location: [facility] ENDOSCOPY;  Service: Gastroenterology;  Laterality: N/A;   ESOPHAGOGASTRODUODENOSCOPY (  EGD) WITH PROPOFOL N/A 09/06/2020   Procedure: ESOPHAGOGASTRODUODENOSCOPY (EGD) WITH PROPOFOL;  Surgeon: Hilarie Fredrickson, MD;  Location: [facility] ENDOSCOPY;  Service: Endoscopy;  Laterality: N/A;   ESOPHAGOGASTRODUODENOSCOPY (EGD) WITH PROPOFOL N/A 10/11/2021   Procedure: ESOPHAGOGASTRODUODENOSCOPY (EGD) WITH PROPOFOL;   Surgeon: Dolores Frame, MD;  Location: [facility] ENDO SUITE;  Service: Gastroenterology;  Laterality: N/A;   HEMOSTASIS CLIP PLACEMENT  09/16/2021   Procedure: HEMOSTASIS CLIP PLACEMENT;  Surgeon: Iva Boop, MD;  Location: [facility] ENDOSCOPY;  Service: Gastroenterology;;   HOT HEMOSTASIS N/A 09/16/2021   Procedure: HOT HEMOSTASIS (ARGON PLASMA COAGULATION/BICAP);  Surgeon: Iva Boop, MD;  Location: [facility] ENDOSCOPY;  Service: Gastroenterology;  Laterality: N/A;   HOT HEMOSTASIS  10/11/2021   Procedure: HOT HEMOSTASIS (ARGON PLASMA COAGULATION/BICAP);  Surgeon: Marguerita Merles, Reuel Boom, MD;  Location: [facility] ENDO SUITE;  Service: Gastroenterology;;   INSERTION OF ILIAC STENT Right 08/20/2020   Procedure: RETROGRADE INSERTION OF RIGHT ILIAC STENT;  Surgeon: Cephus Shelling, MD;  Location: [facility] OR;  Service: Vascular;  Laterality: Right;   INTRAOPERATIVE ARTERIOGRAM Right 08/20/2020   Procedure: INTRA OPERATIVE ARTERIOGRAM ILIAC;  Surgeon: Cephus Shelling, MD;  Location: [facility] OR;  Service: Vascular;  Laterality: Right;   PATCH ANGIOPLASTY Right 08/20/2020   Procedure: PATCH ANGIOPLASTY RIGHT FEMORAL ARTERY;  Surgeon: Cephus Shelling, MD;  Location: [facility] OR;  Service: Vascular;  Laterality: Right;   PERIPHERAL VASCULAR BALLOON ANGIOPLASTY Right 05/30/2021   Procedure: PERIPHERAL VASCULAR BALLOON ANGIOPLASTY;  Surgeon: Cephus Shelling, MD;  Location: [facility] INVASIVE CV LAB;  Service: Cardiovascular;  Laterality: Right;   PORTACATH PLACEMENT Left 06/13/2016   Procedure: INSERTION PORT-A-CATH;  Surgeon: Franky Macho, MD;  Location: [facility] ORS;  Service: General;  Laterality: Left;   TRANSURETHRAL RESECTION OF PROSTATE N/A 05/31/2020   Procedure: TRANSURETHRAL RESECTION OF THE PROSTATE (TURP);  Surgeon: Malen Gauze, MD;  Location: [facility] ORS;  Service: Urology;  Laterality: N/A;   VIDEO BRONCHOSCOPY WITH ENDOBRONCHIAL NAVIGATION N/A 05/28/2016   Procedure: VIDEO BRONCHOSCOPY WITH ENDOBRONCHIAL  NAVIGATION;  Surgeon: Loreli Slot, MD;  Location: [facility] OR;  Service: Thoracic;  Laterality: N/A;   VIDEO BRONCHOSCOPY WITH ENDOBRONCHIAL ULTRASOUND N/A 05/28/2016   Procedure: VIDEO BRONCHOSCOPY WITH ENDOBRONCHIAL ULTRASOUND;  Surgeon: Loreli Slot, MD;  Location: [facility] OR;  Service: Thoracic;  Laterality: N/A;    Family History  Problem Relation Age of Onset   Hypertension Mother    Diabetes Father    Heart disease Father    Stroke Father    Hypertension Sister     SOCIAL HISTORY: Social History   Tobacco Use   Smoking status: Every Day    Packs/day: 0.50    Years: 55.00    Additional pack years: 0.00    Total pack years: 27.50    Types: Cigarettes    Start date: 03/11/1961   Smokeless tobacco: Never   Tobacco comments:    1/2 pack to 1 pack per day 12/14/19  Substance Use Topics   Alcohol use: No    Allergies  Allergen Reactions   Sulfa Antibiotics Swelling    Mouth swelling   Atorvastatin Other (See Comments)    Muscle aches - tolerating Pravastatin 40 mg MWF   Jardiance [Empagliflozin] Other (See Comments)    FEELS SLUGGISH, TIRED   Lopressor [Metoprolol] Other (See Comments)    Fatigue   Rosuvastatin Other (See Comments)    Muscle aches - tolerating Pravastatin 40 mg MWF   Doxycycline Nausea Only   Temazepam Other (See Comments)  Made insomnia worse     Current Outpatient Medications  Medication Sig Dispense Refill   allopurinol (ZYLOPRIM) 100 MG tablet Take 1 tablet (100 mg total) by mouth daily. 30 tablet 6   aspirin EC 81 MG tablet Take 1 tablet (81 mg total) by mouth daily. Swallow whole. 90 tablet 3   Blood Glucose Monitoring Suppl (ONETOUCH VERIO REFLECT) w/Device KIT Use to test blood sugars daily as directed. DX: E11.9 1 kit 1   cetirizine (ZYRTEC ALLERGY) 10 MG tablet Take 1 tablet (10 mg total) by mouth daily. 90 tablet 1   [employer] IRON 325 (65 Fe) MG tablet TAKE 1 TABLET (325MG ) TWICE DAILY 200 tablet 1   dapagliflozin propanediol  (FARXIGA) 5 MG TABS tablet Take 1 tablet (5 mg total) by mouth daily before breakfast. 90 tablet 1   dutasteride (AVODART) 0.5 MG capsule Take 1 capsule (0.5 mg total) by mouth daily. 90 capsule 3   ezetimibe (ZETIA) 10 MG tablet TAKE 1 TABLET BY MOUTH EVERY DAY 90 tablet 3   famotidine (PEPCID) 20 MG tablet One after supper (Patient taking differently: Take 20 mg by mouth daily with supper.) 30 tablet 11   fluticasone (FLONASE) 50 MCG/ACT nasal spray PLACE 1 SPRAY INTO BOTH NOSTRILS DAILY AS NEEDED FOR ALLERGIES OR RHINITIS. 48 mL 1   Fluticasone-Umeclidin-Vilant (TRELEGY ELLIPTA) 100-62.5-25 MCG/ACT AEPB Inhale 1 puff into the lungs daily. 1 each 11   gabapentin (NEURONTIN) 300 MG capsule TAKE 1 CAPSULE BY MOUTH THREE TIMES A DAY 270 capsule 0   KLOR-CON M20 20 MEQ tablet TAKE 1 TABLET BY MOUTH EVERY DAY 90 tablet 0   levothyroxine (SYNTHROID) 50 MCG tablet TAKE 1 TABLET BY MOUTH EVERY DAY 90 tablet 1   linaclotide (LINZESS) 72 MCG capsule Take 1 capsule (72 mcg total) by mouth daily as needed (constipation).     metolazone (ZAROXOLYN) 2.5 MG tablet TAKE 1 TABLET BY MOUTH ON MONDAY, WEDNESDAY AND FRIDAY 45 tablet 1   metoprolol succinate (TOPROL XL) 25 MG 24 hr tablet Take 0.5 tablets (12.5 mg total) by mouth daily. 45 tablet 3   NON FORMULARY CPAP at bedtime     ondansetron (ZOFRAN) 4 MG tablet TAKE 1 TABLET BY MOUTH EVERY 8 HOURS AS NEEDED FOR NAUSEA AND VOMITING 60 tablet 2   OneTouch Delica Lancets 30G MISC Use to test blood sugars daily as directed. DX: E11.9 100 each 1   ONETOUCH VERIO test strip USE TO TEST BLOOD SUGARS DAILY AS DIRECTED. DX: E11.9 100 strip 3   OXYGEN Inhale 2 L into the lungs daily as needed (with Cpap at night).     pantoprazole (PROTONIX) 20 MG tablet Take 1 tablet (20 mg total) by mouth daily. 90 tablet 1   polyethylene glycol powder (GLYCOLAX/MIRALAX) 17 GM/SCOOP powder Take 17 g by mouth 2 (two) times daily as needed. 3350 g 1   pravastatin (PRAVACHOL) 40 MG tablet  TAKE 1 TABLET (40 MG TOTAL) BY MOUTH ON MONDAYS , WEDNESDAYS, AND FRIDAYS AS DIRECTED 48 tablet 0   predniSONE (DELTASONE) 20 MG tablet Take 2 tablets (40 mg total) by mouth daily with breakfast for 5 days. 10 tablet 0   Semaglutide (RYBELSUS) 14 MG TABS Take 1 tablet (14 mg total) by mouth daily. 30 tablet 3   spironolactone (ALDACTONE) 25 MG tablet Take 1 tablet (25 mg total) by mouth daily. 90 tablet 3   torsemide (DEMADEX) 20 MG tablet Take 1 tablet (20 mg total) by mouth daily. 90 tablet 2  zolpidem (AMBIEN) 5 MG tablet Take 1 tablet (5 mg total) by mouth at bedtime as needed for sleep. 30 tablet 2   No current facility-administered medications for this visit.    REVIEW OF SYSTEMS:  [X]  denotes positive finding, [ ]  denotes negative finding Cardiac  Comments:  Chest pain or chest pressure:    Shortness of breath upon exertion:    Short of breath when lying flat:    Irregular heart rhythm:        Vascular    Pain in calf, thigh, or hip brought on by ambulation:    Pain in feet at night that wakes you up from your sleep:     Blood clot in your veins:    Leg swelling:         Pulmonary    Oxygen at home:    Productive cough:     Wheezing:         Neurologic    Sudden weakness in arms or legs:     Sudden numbness in arms or legs:     Sudden onset of difficulty speaking or slurred speech:    Temporary loss of vision in one eye:     Problems with dizziness:         Gastrointestinal    Blood in stool:     Vomited blood:         Genitourinary    Burning when urinating:     Blood in urine:        Psychiatric    Major depression:         Hematologic    Bleeding problems:    Problems with blood clotting too easily:        Skin    Rashes or ulcers:        Constitutional    Fever or chills:      PHYSICAL EXAM: Vitals:   07/22/22 1349  BP: 106/68  Pulse: (!) 112  Resp: 16  Temp: 97.8 F (36.6 C)  TempSrc: Temporal  SpO2: 95%  Weight: 145 lb (65.8 kg)   Height: 5\' 6"  (1.676 m)    GENERAL: The patient is a well-nourished male, in no acute distress. The vital signs are documented above. CARDIAC: There is a regular rate and rhythm.  VASCULAR:  Previous right groin incision well healed Palpable femoral pulses bilaterally Brisk DP PT Doppler signals bilateral lower extremities with no tissue loss  DATA:   ABIs today 1.15 right and brisk  Aortoiliac duplex shows stenosis proximal stent with velocity 225   Right lower extremity arterial duplex shows moderate stenosis 50-74% distal SFA now 250  Assessment/Plan:  77 year old male status post right common femoral endarterectomy with profundoplasty and bovine pericardial patch angioplasty and right common iliac stent on 08/20/2020 for short distance lifestyle limiting claudication.  He developed recurrent calf claudication and started back smoking.  Ultimately most recently underwent right common iliac angioplasty with DCB as well as right SFA and above-knee popliteal angioplasty with DCB on 05/30/2021.    Overall doing well today.  No recurrent calf claudication symptoms in the right leg.  His femoral pulse is palpable.  He has brisk Doppler signals in the right foot.  He is back on aspirin following his remote GI bleed.  He does have some moderate stenosis in the right iliac and distal right SFA that we will follow.  I will do repeat noninvasive imaging in 6 months.   Cephus Shelling, MD [facility] Office: [phone]

## 2022-07-25 ENCOUNTER — Encounter: Payer: Self-pay | Admitting: Radiation Oncology

## 2022-07-26 NOTE — Progress Notes (Signed)
John [name] is here today for follow up post radiation to the lung.  Lung Side: Bilateral Lung, patient completed treatment on 06/26/22.   Does the patient complain of any of the following: Pain:No Shortness of breath w/wo exertion: Yes on exertion.  Cough: Yes, productive cough.  Hemoptysis: No Pain with swallowing: No Swallowing/choking concerns: No Appetite: Fair Weight:  Wt Readings from Last 3 Encounters:  07/28/22 147 lb 9.6 oz (67 kg)  07/22/22 145 lb (65.8 kg)  07/18/22 148 lb (67.1 kg)   Energy Level: Fair Post radiation skin Changes: No  Additional comments if applicable: Patient reports nausea and vomiting after completing radiation treatment.    BP 119/61 (BP Location: Right Arm, Patient Position: Sitting, Cuff Size: Normal)   Pulse 100   Temp 97.8 F (36.6 C)   Resp 20   Ht  (1.676 m)   Wt 147 lb 9.6 oz (67 kg)   SpO2 100%   BMI 23.82 kg/m

## 2022-07-27 NOTE — Progress Notes (Incomplete)
  Radiation Oncology         (336) [phone] ________________________________   Patient Name: John [name] MRN: [id number] DOB: [birth year] Referring Physician: Doreatha Massed Date of Service: 06/26/2022 [facility], [place]                                                        End Of Treatment Note  Diagnoses: C34.11-Malignant neoplasm of upper lobe, right bronchus or lung C34.12-Malignant neoplasm of upper lobe, left bronchus or lung  Cancer Staging: The encounter diagnosis was Non-small cell lung cancer, right (HCC).   Stage IIIa (T1b, N2, cM0) non-small cell carcinoma, now with enlarging bilateral upper lobe nodules   Intent: Curative  Radiation Treatment Dates: 06/19/2022 through 06/26/2022 Site Technique Total Dose (Gy) Dose per Fx (Gy) Completed Fx Beam Energies  Lung, Left: Lung_L SBRT 54/54 18 3/3 6XFFF  Lung, Right: Lung_R SBRT 54/54 18 3/3 6XFFF   Narrative: The patient tolerated radiation therapy relatively well. During his final weekly treatment check on 06/24/22, the patient endorsed some fatigue. He denied any other side effects or concerns.   Plan: The patient will follow-up with radiation oncology in one month .  ________________________________________________ -----------------------------------  Billie Lade, PhD, MD  This document serves as a record of services personally performed by John Blackbird, MD. It was created on his behalf by Neena Rhymes, a trained medical scribe. The creation of this record is based on the scribe's personal observations and the provider's statements to them. This document has been checked and approved by the attending provider.

## 2022-07-27 NOTE — Progress Notes (Signed)
Radiation Oncology         [phone] ________________________________  Name: CHINONSO LINKER MRN: [id number]  Date: 07/28/2022  DOB: 10/27/1945  Follow-Up Visit Note  CC: Junie Spencer, FNP  Doreatha Massed, MD  No diagnosis found.  Diagnosis: The encounter diagnosis was Non-small cell lung cancer, right (HCC).   Stage IIIa (T1b, N2, cM0) non-small cell carcinoma, now with enlarging bilateral upper lobe nodules   Interval Since Last Radiation: 1 month and 1 day   Intent: Curative  Radiation Treatment Dates: 06/19/2022 through 06/26/2022 Site Technique Total Dose (Gy) Dose per Fx (Gy) Completed Fx Beam Energies  Lung, Left: Lung_L IMRT 54/54 18 3/3 6XFFF  Lung, Right: Lung_R IMRT 54/54 18 3/3 6XFFF   Narrative:  The patient returns today for routine follow-up. He tolerated radiation therapy relatively well. During his final weekly treatment check on 06/24/22, the patient endorsed some fatigue. He denied any other side effects or concerns from treatment.   No significant oncologic interval history since the patient completed radiation therapy. He continues to follow with vascular surgery for PAD. He did have a LE vascular US performed recently on 07/17/23 which showed 50- 74% stenosis noted in the superficial femoral artery and / or popliteal artery.     ***                           Allergies:  is allergic to sulfa antibiotics, atorvastatin, jardiance [empagliflozin], lopressor [metoprolol], rosuvastatin, doxycycline, and temazepam.  Meds: Current Outpatient Medications  Medication Sig Dispense Refill   allopurinol (ZYLOPRIM) 100 MG tablet Take 1 tablet (100 mg total) by mouth daily. 30 tablet 6   aspirin EC 81 MG tablet Take 1 tablet (81 mg total) by mouth daily. Swallow whole. 90 tablet 3   Blood Glucose Monitoring Suppl (ONETOUCH VERIO REFLECT) w/Device KIT Use to test blood sugars daily as directed. DX: E11.9 1 kit 1   cetirizine (ZYRTEC ALLERGY) 10 MG tablet  Take 1 tablet (10 mg total) by mouth daily. 90 tablet 1   [facility] IRON 325 (65 Fe) MG tablet TAKE 1 TABLET (325MG ) TWICE DAILY 200 tablet 1   dapagliflozin propanediol (FARXIGA) 5 MG TABS tablet Take 1 tablet (5 mg total) by mouth daily before breakfast. 90 tablet 1   dutasteride (AVODART) 0.5 MG capsule Take 1 capsule (0.5 mg total) by mouth daily. 90 capsule 3   ezetimibe (ZETIA) 10 MG tablet TAKE 1 TABLET BY MOUTH EVERY DAY 90 tablet 3   famotidine (PEPCID) 20 MG tablet One after supper (Patient taking differently: Take 20 mg by mouth daily with supper.) 30 tablet 11   fluticasone (FLONASE) 50 MCG/ACT nasal spray PLACE 1 SPRAY INTO BOTH NOSTRILS DAILY AS NEEDED FOR ALLERGIES OR RHINITIS. 48 mL 1   Fluticasone-Umeclidin-Vilant (TRELEGY ELLIPTA) 100-62.5-25 MCG/ACT AEPB Inhale 1 puff into the lungs daily. 1 each 11   gabapentin (NEURONTIN) 300 MG capsule TAKE 1 CAPSULE BY MOUTH THREE TIMES A DAY 270 capsule 0   KLOR-CON M20 20 MEQ tablet TAKE 1 TABLET BY MOUTH EVERY DAY 90 tablet 0   levothyroxine (SYNTHROID) 50 MCG tablet TAKE 1 TABLET BY MOUTH EVERY DAY 90 tablet 1   linaclotide (LINZESS) 72 MCG capsule Take 1 capsule (72 mcg total) by mouth daily as needed (constipation).     metolazone (ZAROXOLYN) 2.5 MG tablet TAKE 1 TABLET BY MOUTH ON MONDAY, WEDNESDAY AND FRIDAY 45 tablet 1   metoprolol succinate (TOPROL XL)  25 MG 24 hr tablet Take 0.5 tablets (12.5 mg total) by mouth daily. 45 tablet 3   NON FORMULARY CPAP at bedtime     ondansetron (ZOFRAN) 4 MG tablet TAKE 1 TABLET BY MOUTH EVERY 8 HOURS AS NEEDED FOR NAUSEA AND VOMITING 60 tablet 2   OneTouch Delica Lancets 30G MISC Use to test blood sugars daily as directed. DX: E11.9 100 each 1   ONETOUCH VERIO test strip USE TO TEST BLOOD SUGARS DAILY AS DIRECTED. DX: E11.9 100 strip 3   OXYGEN Inhale 2 L into the lungs daily as needed (with Cpap at night).     pantoprazole (PROTONIX) 20 MG tablet Take 1 tablet (20 mg total) by mouth daily. 90 tablet 1    polyethylene glycol powder (GLYCOLAX/MIRALAX) 17 GM/SCOOP powder Take 17 g by mouth 2 (two) times daily as needed. 3350 g 1   pravastatin (PRAVACHOL) 40 MG tablet TAKE 1 TABLET (40 MG TOTAL) BY MOUTH ON MONDAYS , WEDNESDAYS, AND FRIDAYS AS DIRECTED 48 tablet 0   Semaglutide (RYBELSUS) 14 MG TABS Take 1 tablet (14 mg total) by mouth daily. 30 tablet 3   spironolactone (ALDACTONE) 25 MG tablet Take 1 tablet (25 mg total) by mouth daily. 90 tablet 3   torsemide (DEMADEX) 20 MG tablet Take 1 tablet (20 mg total) by mouth daily. 90 tablet 2   zolpidem (AMBIEN) 5 MG tablet Take 1 tablet (5 mg total) by mouth at bedtime as needed for sleep. 30 tablet 2   No current facility-administered medications for this encounter.    Physical Findings: The patient is in no acute distress. Patient is alert and oriented.  vitals were not taken for this visit. .  No significant changes. Lungs are clear to auscultation bilaterally. Heart has regular rate and rhythm. No palpable cervical, supraclavicular, or axillary adenopathy. Abdomen soft, non-tender, normal bowel sounds.   Lab Findings: Lab Results  Component Value Date   WBC 11.0 (H) 04/25/2022   HGB 12.6 (L) 04/25/2022   HCT 38.1 (L) 04/25/2022   MCV 102.1 (H) 04/25/2022   PLT 297 04/25/2022    Radiographic Findings: VAS US AORTA/IVC/ILIACS  Result Date: 07/17/2022 ABDOMINAL AORTA STUDY Patient Name:  LIBORIO SACCENTE  Date of Exam:   07/17/2022 Medical Rec #: [id number]         Accession #:    [id number] Date of Birth: 1946-01-04         Patient Gender: M Patient Age:   77 years Exam Location:  [facility] Procedure:      VAS US AORTA/IVC/ILIACS Referring Phys: Sherald Hess --------------------------------------------------------------------------------  Indications: Right common Iliac artery stent evaluation Risk Factors: Hypertension, hyperlipidemia, current smoker. COPD Limitations: Air/bowel gas and Breathing artifact.   Comparison Study: No significant change since prior exams Performing Technologist: Elita Quick RVT  Examination Guidelines: A complete evaluation includes B-mode imaging, spectral Doppler, color Doppler, and power Doppler as needed of all accessible portions of each vessel. Bilateral testing is considered an integral part of a complete examination. Limited examinations for reoccurring indications may be performed as noted.  Abdominal Aorta Findings: +-------------+-------+----------+----------+----------+--------+--------+ Location     AP (cm)Trans (cm)PSV (cm/s)Waveform  ThrombusComments +-------------+-------+----------+----------+----------+--------+--------+ Distal                        42        monophasic                 +-------------+-------+----------+----------+----------+--------+--------+ RT EIA Prox  100       biphasic                   +-------------+-------+----------+----------+----------+--------+--------+ RT EIA Mid                    66        biphasic                   +-------------+-------+----------+----------+----------+--------+--------+ RT EIA Distal                 53        biphasic                   +-------------+-------+----------+----------+----------+--------+--------+ LT EIA Distal                 150       biphasic                   +-------------+-------+----------+----------+----------+--------+--------+ Right Stent(s): +---------------+--------+---------------+--------+--------+ CIA stent      PSV cm/sStenosis       WaveformComments +---------------+--------+---------------+--------+--------+ Prox to Stent  225     50-99% stenosisbiphasic         +---------------+--------+---------------+--------+--------+ Proximal Stent 181                    biphasic         +---------------+--------+---------------+--------+--------+ Mid Stent      136                    biphasic          +---------------+--------+---------------+--------+--------+ Distal Stent   144                    biphasic         +---------------+--------+---------------+--------+--------+ Distal to Stent106                    biphasic         +---------------+--------+---------------+--------+--------+    Summary: Patent right common iliac artery stent with increased velocity in the 50 - 99% stenosis range, This was a technically difficult exam due to breathing artifact.  *See table(s) above for measurements and observations.  Electronically signed by Waverly Ferrari MD on 07/17/2022 at 10:21:20 AM.    Final    VAS [place] LOWER EXTREMITY ARTERIAL DUPLEX  Result Date: 07/17/2022 LOWER EXTREMITY ARTERIAL DUPLEX STUDY Patient Name:  GEDALYA JIM  Date of Exam:   07/17/2022 Medical Rec #: [id number]         Accession #:    [id number] Date of Birth: Sep 09, 1945         Patient Gender: M Patient Age:   57 years Exam Location:  [facility] Procedure:      VAS [place] LOWER EXTREMITY ARTERIAL DUPLEX Referring Phys: Sherald Hess --------------------------------------------------------------------------------  Indications: Claudication, and peripheral artery disease. High Risk Factors: Hypertension, hyperlipidemia, current smoker.  Vascular Interventions: 08/20/20: Right CIA angioplasty and stent, CFA                         endarterectomy and profundoplasty. Current ABI:            Right ABI 1.15 Left 1.11 Performing Technologist: Elita Quick RVT  Examination Guidelines: A complete evaluation includes B-mode imaging, spectral Doppler, color Doppler, and power Doppler as needed of all accessible portions of each vessel. Bilateral testing is considered an integral part  of a complete examination. Limited examinations for reoccurring indications may be performed as noted.  +-----------+--------+-----+---------------+--------+--------+ RIGHT      PSV cm/sRatioStenosis       WaveformComments  +-----------+--------+-----+---------------+--------+--------+ EIA Distal 212                         biphasic         +-----------+--------+-----+---------------+--------+--------+ CFA Prox   162                         biphasic         +-----------+--------+-----+---------------+--------+--------+ CFA Distal 100                         biphasic         +-----------+--------+-----+---------------+--------+--------+ DFA        100                         biphasic         +-----------+--------+-----+---------------+--------+--------+ SFA Prox   129                         biphasicplaque   +-----------+--------+-----+---------------+--------+--------+ SFA Mid    63                          biphasic         +-----------+--------+-----+---------------+--------+--------+ SFA Distal 250          50-74% stenosisbiphasic         +-----------+--------+-----+---------------+--------+--------+ POP Prox   33                          biphasic         +-----------+--------+-----+---------------+--------+--------+ POP Mid    42                          biphasic         +-----------+--------+-----+---------------+--------+--------+ POP Distal 48                          biphasic         +-----------+--------+-----+---------------+--------+--------+ TP Trunk   48                          biphasic         +-----------+--------+-----+---------------+--------+--------+ ATA Distal 26                          biphasic         +-----------+--------+-----+---------------+--------+--------+ PTA Distal 20                          biphasic         +-----------+--------+-----+---------------+--------+--------+ PERO Distal24                          biphasic         +-----------+--------+-----+---------------+--------+--------+   Summary: Right: 50-74% stenosis noted in the superficial femoral artery and/or popliteal artery.  See table(s) above for  measurements and observations. Electronically signed by Waverly Ferrari MD on 07/17/2022 at 10:11:07 AM.    Final  VAS [place] ABI WITH/WO TBI  Result Date: 07/17/2022  LOWER EXTREMITY DOPPLER STUDY Patient Name:  DELWYN SCOGGIN  Date of Exam:   07/17/2022 Medical Rec #: [id number]         Accession #:    [id number] Date of Birth: 1945-08-21         Patient Gender: M Patient Age:   23 years Exam Location:  [facility] Procedure:      VAS [place] ABI WITH/WO TBI Referring Phys: Sherald Hess --------------------------------------------------------------------------------  Indications: Claudication, and peripheral artery disease. High Risk Factors: Hypertension, hyperlipidemia, current smoker.  Vascular Interventions: 08/20/20: Right CIA angioplasty and stent, CFA                         endarterectomy and profundoplasty. Performing Technologist: Elita Quick RVT  Examination Guidelines: A complete evaluation includes at minimum, Doppler waveform signals and systolic blood pressure reading at the level of bilateral brachial, anterior tibial, and posterior tibial arteries, when vessel segments are accessible. Bilateral testing is considered an integral part of a complete examination. Photoelectric Plethysmograph (PPG) waveforms and toe systolic pressure readings are included as required and additional duplex testing as needed. Limited examinations for reoccurring indications may be performed as noted.  ABI Findings: +---------+------------------+-----+----------+--------+ Right    Rt Pressure (mmHg)IndexWaveform  Comment  +---------+------------------+-----+----------+--------+ Brachial 114                                       +---------+------------------+-----+----------+--------+ PTA      96                0.84 biphasic           +---------+------------------+-----+----------+--------+ DP       131               1.15 monophasicbrisk     +---------+------------------+-----+----------+--------+ Great Toe79                0.69 Abnormal           +---------+------------------+-----+----------+--------+ +---------+------------------+-----+----------+-------+ Left     Lt Pressure (mmHg)IndexWaveform  Comment +---------+------------------+-----+----------+-------+ Brachial 114                                      +---------+------------------+-----+----------+-------+ PTA      126               1.11 monophasicbrisk   +---------+------------------+-----+----------+-------+ DP       111               0.97 monophasic        +---------+------------------+-----+----------+-------+ Great Toe66                0.58 Abnormal          +---------+------------------+-----+----------+-------+ +-------+-----------+-----------+------------+------------+ ABI/TBIToday's ABIToday's TBIPrevious ABIPrevious TBI +-------+-----------+-----------+------------+------------+ Right  1.15       0.69       0.84        0.62         +-------+-----------+-----------+------------+------------+ Left   1.11       0.58       0.98        0.61         +-------+-----------+-----------+------------+------------+  Bilateral TBIs appear essentially unchanged.  Summary: Right: Resting right ankle-brachial index appears to be  within normal range, may be falsley elevated due to calcified arteries. The right toe-brachial index is abnormal. Left: Resting left ankle-brachial index appears to be within normal range, may be falsley elevated due to calcified arteries.. The left toe-brachial index is abnormal. *See table(s) above for measurements and observations.  Electronically signed by Waverly Ferrari MD on 07/17/2022 at 10:10:56 AM.    Final    CUP PACEART REMOTE DEVICE CHECK  Result Date: 07/03/2022 ILR summary report received. Battery status OK. Normal device function. No new symptom, tachy, brady, or pause episodes. No new AF episodes.  Monthly summary reports and ROV/PRN 10 new AT episodes, longest duration 8hrs , mean HR's 113-121, histogram peak 100-110 LA, CVRS   Impression: The encounter diagnosis was Non-small cell lung cancer, right (HCC).   Stage IIIa (T1b, N2, cM0) non-small cell carcinoma, now with enlarging bilateral upper lobe nodules    The patient is recovering from the effects of radiation.  ***  Plan:  ***   *** minutes of total time was spent for this patient encounter, including preparation, face-to-face counseling with the patient and coordination of care, physical exam, and documentation of the encounter. ____________________________________  Billie Lade, PhD, MD  This document serves as a record of services personally performed by Antony Blackbird, MD. It was created on his behalf by Neena Rhymes, a trained medical scribe. The creation of this record is based on the scribe's personal observations and the provider's statements to them. This document has been checked and approved by the attending provider.

## 2022-07-28 ENCOUNTER — Encounter: Payer: Self-pay | Admitting: Radiation Oncology

## 2022-07-28 ENCOUNTER — Ambulatory Visit
Admission: RE | Admit: 2022-07-28 | Discharge: 2022-07-28 | Disposition: A | Discharge status: Home / Self Care | Payer: Medicare Other | Source: Ambulatory Visit | Attending: Radiation Oncology | Admitting: Radiation Oncology

## 2022-07-28 VITALS — BP 119/61 | HR 100 | Temp 97.8°F | Resp 20 | Ht 66.0 in | Wt 147.6 lb

## 2022-07-28 DIAGNOSIS — C3411 Malignant neoplasm of upper lobe, right bronchus or lung: Secondary | ICD-10-CM | POA: Insufficient documentation

## 2022-07-28 DIAGNOSIS — C3491 Malignant neoplasm of unspecified part of right bronchus or lung: Secondary | ICD-10-CM

## 2022-07-28 DIAGNOSIS — Z923 Personal history of irradiation: Secondary | ICD-10-CM | POA: Diagnosis not present

## 2022-07-28 DIAGNOSIS — R5383 Other fatigue: Secondary | ICD-10-CM | POA: Insufficient documentation

## 2022-07-28 DIAGNOSIS — C3412 Malignant neoplasm of upper lobe, left bronchus or lung: Secondary | ICD-10-CM | POA: Diagnosis not present

## 2022-07-29 ENCOUNTER — Other Ambulatory Visit: Payer: Self-pay

## 2022-07-29 DIAGNOSIS — I739 Peripheral vascular disease, unspecified: Secondary | ICD-10-CM

## 2022-08-01 ENCOUNTER — Encounter: Payer: Self-pay | Admitting: Family Medicine

## 2022-08-01 ENCOUNTER — Ambulatory Visit (INDEPENDENT_AMBULATORY_CARE_PROVIDER_SITE_OTHER): Payer: Medicare Other | Admitting: Family Medicine

## 2022-08-01 VITALS — BP 128/74 | HR 105 | Temp 96.9°F | Ht 66.0 in | Wt 149.0 lb

## 2022-08-01 DIAGNOSIS — H52223 Regular astigmatism, bilateral: Secondary | ICD-10-CM | POA: Diagnosis not present

## 2022-08-01 DIAGNOSIS — E119 Type 2 diabetes mellitus without complications: Secondary | ICD-10-CM | POA: Diagnosis not present

## 2022-08-01 DIAGNOSIS — M25561 Pain in right knee: Secondary | ICD-10-CM | POA: Diagnosis not present

## 2022-08-01 DIAGNOSIS — H524 Presbyopia: Secondary | ICD-10-CM | POA: Diagnosis not present

## 2022-08-01 DIAGNOSIS — H26492 Other secondary cataract, left eye: Secondary | ICD-10-CM | POA: Diagnosis not present

## 2022-08-01 DIAGNOSIS — H02105 Unspecified ectropion of left lower eyelid: Secondary | ICD-10-CM | POA: Diagnosis not present

## 2022-08-01 DIAGNOSIS — M109 Gout, unspecified: Secondary | ICD-10-CM | POA: Diagnosis not present

## 2022-08-01 DIAGNOSIS — H353131 Nonexudative age-related macular degeneration, bilateral, early dry stage: Secondary | ICD-10-CM | POA: Diagnosis not present

## 2022-08-01 DIAGNOSIS — Z961 Presence of intraocular lens: Secondary | ICD-10-CM | POA: Diagnosis not present

## 2022-08-01 DIAGNOSIS — H5203 Hypermetropia, bilateral: Secondary | ICD-10-CM | POA: Diagnosis not present

## 2022-08-01 DIAGNOSIS — H02102 Unspecified ectropion of right lower eyelid: Secondary | ICD-10-CM | POA: Diagnosis not present

## 2022-08-01 DIAGNOSIS — H43812 Vitreous degeneration, left eye: Secondary | ICD-10-CM | POA: Diagnosis not present

## 2022-08-01 LAB — HM DIABETES EYE EXAM

## 2022-08-01 MED ORDER — METHYLPREDNISOLONE ACETATE 40 MG/ML IJ SUSP
40.0000 mg | Freq: Once | INTRAMUSCULAR | Status: AC
Start: 2022-08-01 — End: 2022-08-01
  Administered 2022-08-01: 40 mg via INTRAMUSCULAR

## 2022-08-01 MED ORDER — PREDNISONE 20 MG PO TABS
ORAL_TABLET | ORAL | 0 refills | Status: DC
Start: 2022-08-01 — End: 2022-08-25

## 2022-08-01 NOTE — Progress Notes (Signed)
Subjective:  Patient ID: John [name], male    DOB: 08-25-1945, 77 y.o.   MRN: [id number]  Patient Care Team: Junie Spencer, FNP as PCP - General (Family Medicine) O'Neal, Ronnald Ramp, MD as PCP - Cardiology (Cardiology) Lanier Prude, MD as PCP - Electrophysiology (Cardiology) Doreatha Massed, MD as Consulting Physician (Hematology) Danella Maiers, [facility] (Pharmacist) O'Neal, Ronnald Ramp, MD as Consulting Physician (Cardiology) McKenzie, Mardene Celeste, MD as Consulting Physician (Urology) Delora Fuel, OD (Optometry) Dani Gobble, NP as Nurse Practitioner (Endocrinology) Adam Phenix, DPM as Consulting Physician (Podiatry) Cephus Shelling, MD as Consulting Physician (Vascular Surgery)   Chief Complaint:  Knee Pain (Right x 2-3 days- red with swelling )   HPI: John [name] is a 77 y.o. male presenting on 08/01/2022 for Knee Pain (Right x 2-3 days- red with swelling )   Pt presents today with complaints of right knee pain. Did have pain and swelling with redness. States it has slowly improved but is still present. Still tender and slightly red but greatly improved. Does have gout.   Knee Pain  The incident occurred 2 days ago. There was no injury mechanism. The pain is present in the right knee. The quality of the pain is described as aching and burning. The pain is moderate. The pain has been Fluctuating since onset. Pertinent negatives include no inability to bear weight, loss of motion, loss of sensation, muscle weakness, numbness or tingling. He reports no foreign bodies present. The symptoms are aggravated by palpation and weight bearing. He has tried acetaminophen for the symptoms. The treatment provided mild relief.       Relevant past medical, surgical, family, and social history reviewed and updated as indicated.  Allergies and medications reviewed and updated. Data reviewed: Chart in Epic.   Past Medical History:  Diagnosis Date    Adenocarcinoma of lung, right (HCC) 04/18/2016   Anemia    Anxiety    Arthritis    Asthma    Atrial flutter (HCC)    BPH (benign prostatic hyperplasia)    with urinary retention 02/06/20   CHF (congestive heart failure) (HCC)    COPD (chronic obstructive pulmonary disease) (HCC)    Depression    Diabetes mellitus, type II (HCC)    Dyspnea    History of kidney stones    History of radiation therapy    right lung 09/10/2020-09/17/2020   Dr Roselind Messier   History of radiation therapy    Left and right Lung- 06/19/22-06/26/22   Hyperlipidemia    Hypertension    Hypothyroidism    Macular degeneration    Neuropathy    Non-small cell lung cancer, right (HCC) 04/18/2016   Peripheral vascular disease (HCC)    Prostatitis    Pulmonary nodule, left 07/16/2016   Sleep apnea    cpap    Past Surgical History:  Procedure Laterality Date   A-FLUTTER ABLATION N/A 01/13/2022   Procedure: A-FLUTTER ABLATION;  Surgeon: Lanier Prude, MD;  Location: [facility];  Service: Cardiovascular;  Laterality: N/A;   ABDOMINAL AORTOGRAM W/LOWER EXTREMITY Left 02/06/2020   Procedure: ABDOMINAL AORTOGRAM W/LOWER EXTREMITY;  Surgeon: Runell Gess, MD;  Location: [facility];  Service: Cardiovascular;  Laterality: Left;   ABDOMINAL AORTOGRAM W/LOWER EXTREMITY N/A 05/30/2021   Procedure: ABDOMINAL AORTOGRAM W/LOWER EXTREMITY;  Surgeon: Cephus Shelling, MD;  Location: [facility];  Service: Cardiovascular;  Laterality: N/A;   BIOPSY  09/16/2021  Procedure: BIOPSY;  Surgeon: Iva Boop, MD;  Location: [facility] ENDOSCOPY;  Service: Gastroenterology;;   CARDIOVERSION N/A 10/05/2020   Procedure: CARDIOVERSION;  Surgeon: Sande Rives, MD;  Location: [facility] ENDOSCOPY;  Service: Cardiovascular;  Laterality: N/A;   CATARACT EXTRACTION, BILATERAL Bilateral    COLONOSCOPY WITH PROPOFOL N/A 06/20/2021   Procedure: COLONOSCOPY WITH PROPOFOL;  Surgeon: Hilarie Fredrickson, MD;  Location: [facility] ENDOSCOPY;   Service: Endoscopy;  Laterality: N/A;   COLONOSCOPY WITH PROPOFOL N/A 09/16/2021   Procedure: COLONOSCOPY WITH PROPOFOL;  Surgeon: Iva Boop, MD;  Location: [facility] ENDOSCOPY;  Service: Gastroenterology;  Laterality: N/A;   ENDARTERECTOMY FEMORAL Right 08/20/2020   Procedure: ENDARTERECTOMY  RIGHT FEMORAL ARTERY;  Surgeon: Cephus Shelling, MD;  Location: [facility] OR;  Service: Vascular;  Laterality: Right;   ENTEROSCOPY N/A 10/11/2021   Procedure: ENTEROSCOPY;  Surgeon: Dolores Frame, MD;  Location: [facility] ENDO SUITE;  Service: Gastroenterology;  Laterality: N/A;   ESOPHAGOGASTRODUODENOSCOPY N/A 09/16/2021   Procedure: ESOPHAGOGASTRODUODENOSCOPY (EGD);  Surgeon: Iva Boop, MD;  Location: [facility] ENDOSCOPY;  Service: Gastroenterology;  Laterality: N/A;   ESOPHAGOGASTRODUODENOSCOPY (EGD) WITH PROPOFOL N/A 09/06/2020   Procedure: ESOPHAGOGASTRODUODENOSCOPY (EGD) WITH PROPOFOL;  Surgeon: Hilarie Fredrickson, MD;  Location: [facility] ENDOSCOPY;  Service: Endoscopy;  Laterality: N/A;   ESOPHAGOGASTRODUODENOSCOPY (EGD) WITH PROPOFOL N/A 10/11/2021   Procedure: ESOPHAGOGASTRODUODENOSCOPY (EGD) WITH PROPOFOL;  Surgeon: Dolores Frame, MD;  Location: [facility];  Service: Gastroenterology;  Laterality: N/A;   HEMOSTASIS CLIP PLACEMENT  09/16/2021   Procedure: HEMOSTASIS CLIP PLACEMENT;  Surgeon: Iva Boop, MD;  Location: [facility] ENDOSCOPY;  Service: Gastroenterology;;   HOT HEMOSTASIS N/A 09/16/2021   Procedure: HOT HEMOSTASIS (ARGON PLASMA COAGULATION/BICAP);  Surgeon: Iva Boop, MD;  Location: [facility] ENDOSCOPY;  Service: Gastroenterology;  Laterality: N/A;   HOT HEMOSTASIS  10/11/2021   Procedure: HOT HEMOSTASIS (ARGON PLASMA COAGULATION/BICAP);  Surgeon: Marguerita Merles, Reuel Boom, MD;  Location: [facility]O [facility];  Service: Gastroenterology;;   INSERTION OF ILIAC STENT Right 08/20/2020   Procedure: RETROGRADE INSERTION OF RIGHT ILIAC STENT;  Surgeon: Cephus Shelling, MD;  Location: [facility] OR;  Service:  Vascular;  Laterality: Right;   INTRAOPERATIVE ARTERIOGRAM Right 08/20/2020   Procedure: INTRA OPERATIVE ARTERIOGRAM ILIAC;  Surgeon: Cephus Shelling, MD;  Location: [facility] OR;  Service: Vascular;  Laterality: Right;   PATCH ANGIOPLASTY Right 08/20/2020   Procedure: PATCH ANGIOPLASTY RIGHT FEMORAL ARTERY;  Surgeon: Cephus Shelling, MD;  Location: [facility] OR;  Service: Vascular;  Laterality: Right;   PERIPHERAL VASCULAR BALLOON ANGIOPLASTY Right 05/30/2021   Procedure: PERIPHERAL VASCULAR BALLOON ANGIOPLASTY;  Surgeon: Cephus Shelling, MD;  Location: [facility];  Service: Cardiovascular;  Laterality: Right;   PORTACATH PLACEMENT Left 06/13/2016   Procedure: INSERTION PORT-A-CATH;  Surgeon: Franky Macho, MD;  Location: [facility] ORS;  Service: General;  Laterality: Left;   TRANSURETHRAL RESECTION OF PROSTATE N/A 05/31/2020   Procedure: TRANSURETHRAL RESECTION OF THE PROSTATE (TURP);  Surgeon: Malen Gauze, MD;  Location: [facility] ORS;  Service: Urology;  Laterality: N/A;   VIDEO BRONCHOSCOPY WITH ENDOBRONCHIAL NAVIGATION N/A 05/28/2016   Procedure: VIDEO BRONCHOSCOPY WITH ENDOBRONCHIAL NAVIGATION;  Surgeon: Loreli Slot, MD;  Location: [facility] OR;  Service: Thoracic;  Laterality: N/A;   VIDEO BRONCHOSCOPY WITH ENDOBRONCHIAL ULTRASOUND N/A 05/28/2016   Procedure: VIDEO BRONCHOSCOPY WITH ENDOBRONCHIAL ULTRASOUND;  Surgeon: Loreli Slot, MD;  Location: [facility] OR;  Service: Thoracic;  Laterality: N/A;    Social History   Socioeconomic History   Marital status: Domestic  Partner    Spouse name: Not on file   Number of children: 5   Years of education: 10   Highest education level: 10th grade  Occupational History   Occupation: Retired  Tobacco Use   Smoking status: Every Day    Packs/day: 0.50    Years: 55.00    Additional pack years: 0.00    Total pack years: 27.50    Types: Cigarettes    Start date: 03/11/1961   Smokeless tobacco: Never   Tobacco comments:    1/2 pack to 1 pack  per day 12/14/19  Vaping Use   Vaping Use: Never used  Substance and Sexual Activity   Alcohol use: No   Drug use: No   Sexual activity: Yes    Birth control/protection: None  Other Topics Concern   Not on file  Social History Narrative   [name] is his POA and lives with him. Children out of town - one in [facility], others in [place].   Social Determinants of Health   Financial Resource Strain: Low Risk  (11/15/2021)   Overall Financial Resource Strain (CARDIA)    Difficulty of Paying Living Expenses: Not hard at all  Food Insecurity: No Food Insecurity (05/26/2022)   Hunger Vital Sign    Worried About Running Out of Food in the Last Year: Never true    Ran Out of Food in the Last Year: Never true  Transportation Needs: No Transportation Needs (05/26/2022)   PRAPARE - Administrator, Civil Service (Medical): No    Lack of Transportation (Non-Medical): No  Physical Activity: Inactive (11/15/2021)   Exercise Vital Sign    Days of Exercise per Week: 0 days    Minutes of Exercise per Session: 0 min  Stress: No Stress Concern Present (11/15/2021)   [employer] of Occupational Health - Occupational Stress Questionnaire    Feeling of Stress : Only a little  Social Connections: Moderately Isolated (11/15/2021)   Social Connection and Isolation Panel [NHANES]    Frequency of Communication with Friends and Family: More than three times a week    Frequency of Social Gatherings with Friends and Family: Once a week    Attends Religious Services: Never    Database administrator or Organizations: No    Attends Banker Meetings: Never    Marital Status: Living with partner  Intimate Partner Violence: Not At Risk (05/26/2022)   Humiliation, Afraid, Rape, and Kick questionnaire    Fear of Current or Ex-Partner: No    Emotionally Abused: No    Physically Abused: No    Sexually Abused: No    Outpatient Encounter Medications as of 08/01/2022  Medication Sig    allopurinol (ZYLOPRIM) 100 MG tablet Take 1 tablet (100 mg total) by mouth daily.   aspirin EC 81 MG tablet Take 1 tablet (81 mg total) by mouth daily. Swallow whole.   Blood Glucose Monitoring Suppl (ONETOUCH VERIO REFLECT) w/Device KIT Use to test blood sugars daily as directed. DX: E11.9   cetirizine (ZYRTEC ALLERGY) 10 MG tablet Take 1 tablet (10 mg total) by mouth daily.   dapagliflozin propanediol (FARXIGA) 5 MG TABS tablet Take 1 tablet (5 mg total) by mouth daily before breakfast.   dutasteride (AVODART) 0.5 MG capsule Take 1 capsule (0.5 mg total) by mouth daily.   ezetimibe (ZETIA) 10 MG tablet TAKE 1 TABLET BY MOUTH EVERY DAY   famotidine (PEPCID) 20 MG tablet One after supper (Patient taking differently:  Take 20 mg by mouth daily with supper.)   fluticasone (FLONASE) 50 MCG/ACT nasal spray PLACE 1 SPRAY INTO BOTH NOSTRILS DAILY AS NEEDED FOR ALLERGIES OR RHINITIS.   Fluticasone-Umeclidin-Vilant (TRELEGY ELLIPTA) 100-62.5-25 MCG/ACT AEPB Inhale 1 puff into the lungs daily.   gabapentin (NEURONTIN) 300 MG capsule TAKE 1 CAPSULE BY MOUTH THREE TIMES A DAY   KLOR-CON M20 20 MEQ tablet TAKE 1 TABLET BY MOUTH EVERY DAY   levothyroxine (SYNTHROID) 50 MCG tablet TAKE 1 TABLET BY MOUTH EVERY DAY   linaclotide (LINZESS) 72 MCG capsule Take 1 capsule (72 mcg total) by mouth daily as needed (constipation).   metolazone (ZAROXOLYN) 2.5 MG tablet TAKE 1 TABLET BY MOUTH ON MONDAY, WEDNESDAY AND FRIDAY   metoprolol succinate (TOPROL XL) 25 MG 24 hr tablet Take 0.5 tablets (12.5 mg total) by mouth daily.   NON FORMULARY CPAP at bedtime   ondansetron (ZOFRAN) 4 MG tablet TAKE 1 TABLET BY MOUTH EVERY 8 HOURS AS NEEDED FOR NAUSEA AND VOMITING   OneTouch Delica Lancets 30G MISC Use to test blood sugars daily as directed. DX: E11.9   ONETOUCH VERIO test strip USE TO TEST BLOOD SUGARS DAILY AS DIRECTED. DX: E11.9   OXYGEN Inhale 2 L into the lungs daily as needed (with Cpap at night).   pantoprazole  (PROTONIX) 20 MG tablet Take 1 tablet (20 mg total) by mouth daily.   polyethylene glycol powder (GLYCOLAX/MIRALAX) 17 GM/SCOOP powder Take 17 g by mouth 2 (two) times daily as needed.   pravastatin (PRAVACHOL) 40 MG tablet TAKE 1 TABLET (40 MG TOTAL) BY MOUTH ON MONDAYS , WEDNESDAYS, AND FRIDAYS AS DIRECTED   predniSONE (DELTASONE) 20 MG tablet 2 po at sametime daily for 5 days-   Semaglutide (RYBELSUS) 14 MG TABS Take 1 tablet (14 mg total) by mouth daily.   spironolactone (ALDACTONE) 25 MG tablet Take 1 tablet (25 mg total) by mouth daily.   torsemide (DEMADEX) 20 MG tablet Take 1 tablet (20 mg total) by mouth daily.   zolpidem (AMBIEN) 5 MG tablet Take 1 tablet (5 mg total) by mouth at bedtime as needed for sleep.   [employer] IRON 325 (65 Fe) MG tablet TAKE 1 TABLET (325MG ) TWICE DAILY (Patient not taking: Reported on 08/01/2022)   [EXPIRED] methylPREDNISolone acetate (DEPO-MEDROL) injection 40 mg    No facility-administered encounter medications on file as of 08/01/2022.    Allergies  Allergen Reactions   Sulfa Antibiotics Swelling    Mouth swelling   Atorvastatin Other (See Comments)    Muscle aches - tolerating Pravastatin 40 mg MWF   Jardiance [Empagliflozin] Other (See Comments)    FEELS SLUGGISH, TIRED   Lopressor [Metoprolol] Other (See Comments)    Fatigue   Rosuvastatin Other (See Comments)    Muscle aches - tolerating Pravastatin 40 mg MWF   Doxycycline Nausea Only   Temazepam Other (See Comments)    Made insomnia worse     Review of Systems  Constitutional:  Negative for activity change, appetite change, chills, fatigue and fever.  HENT: Negative.    Eyes: Negative.   Respiratory:  Negative for cough, chest tightness and shortness of breath.   Cardiovascular:  Negative for chest pain, palpitations and leg swelling.  Gastrointestinal:  Negative for blood in stool, constipation, diarrhea, nausea and vomiting.  Endocrine: Negative.   Genitourinary:  Negative for decreased  urine volume, difficulty urinating, dysuria, frequency and urgency.  Musculoskeletal:  Positive for arthralgias and joint swelling (improved). Negative for myalgias.  Skin: Negative.  Allergic/Immunologic: Negative.   Neurological:  Negative for dizziness, tingling, numbness and headaches.  Hematological: Negative.   Psychiatric/Behavioral:  Negative for confusion, hallucinations, sleep disturbance and suicidal ideas.   All other systems reviewed and are negative.       Objective:  BP 128/74   Pulse (!) 105   Temp (!) 96.9 F (36.1 C) (Temporal)   Ht 5\' 6"  (1.676 m)   Wt 149 lb (67.6 kg)   SpO2 98%   BMI 24.05 kg/m    Wt Readings from Last 3 Encounters:  08/01/22 149 lb (67.6 kg)  07/28/22 147 lb 9.6 oz (67 kg)  07/22/22 145 lb (65.8 kg)    Physical Exam Vitals and nursing note reviewed.  Constitutional:      General: He is not in acute distress.    Appearance: Normal appearance. He is well-developed and well-groomed. He is not ill-appearing, toxic-appearing or diaphoretic.  HENT:     Head: Normocephalic and atraumatic.     Jaw: There is normal jaw occlusion.     Right Ear: Hearing normal.     Left Ear: Hearing normal.     Nose: Nose normal.     Mouth/Throat:     Lips: Pink.     Mouth: Mucous membranes are moist.     Pharynx: Oropharynx is clear. Uvula midline.  Eyes:     General: Lids are normal.     Extraocular Movements: Extraocular movements intact.     Conjunctiva/sclera: Conjunctivae normal.     Pupils: Pupils are equal, round, and reactive to light.  Neck:     Trachea: Trachea and phonation normal.  Cardiovascular:     Rate and Rhythm: Normal rate and regular rhythm.     Chest Wall: PMI is not displaced.     Pulses: Normal pulses.     Heart sounds: Normal heart sounds. No murmur heard.    No friction rub. No gallop.  Pulmonary:     Effort: Pulmonary effort is normal. No respiratory distress.     Breath sounds: Normal breath sounds. No wheezing.   Musculoskeletal:        General: Normal range of motion.     Cervical back: Normal range of motion and neck supple.     Right upper leg: Normal.     Right knee: Erythema present. No swelling, deformity, effusion, ecchymosis or lacerations. Normal range of motion. Tenderness present.     Right lower leg: Normal. No edema.     Left lower leg: No edema.  Skin:    General: Skin is warm and dry.     Capillary Refill: Capillary refill takes less than 2 seconds.     Coloration: Skin is not cyanotic, jaundiced or pale.     Findings: No rash.  Neurological:     General: No focal deficit present.     Mental Status: He is alert and oriented to person, place, and time.     Sensory: Sensation is intact.     Motor: Motor function is intact.     Coordination: Coordination is intact.     Gait: Gait is intact.     Deep Tendon Reflexes: Reflexes are normal and symmetric.  Psychiatric:        Attention and Perception: Attention and perception normal.        Mood and Affect: Mood and affect normal.        Speech: Speech normal.        Behavior: Behavior normal. Behavior is cooperative.  Thought Content: Thought content normal.        Cognition and Memory: Cognition and memory normal.        Judgment: Judgment normal.     Results for orders placed or performed during the hospital encounter of 07/17/22  VAS [place] ABI WITH/WO TBI  Result Value Ref Range   Right ABI 1.15    Left ABI 1.11    *Note: Due to a large number of results and/or encounters for the requested time period, some results have not been displayed. A complete set of results can be found in Results Review.       Pertinent labs & imaging results that were available during my care of the patient were reviewed by me and considered in my medical decision making.  Assessment & Plan:  Jahdiel was seen today for knee pain.  Diagnoses and all orders for this visit:  Acute pain of right knee Gouty arthritis Redness, increased  heat, and tenderness to right knee. History of gout. Classic gout flair. Will burst with steroids. Pt aware to report new, worsening, or persistent symptoms.  -     predniSONE (DELTASONE) 20 MG tablet; 2 po at sametime daily for 5 days- -     methylPREDNISolone acetate (DEPO-MEDROL) injection 40 mg     Continue all other maintenance medications.  Follow up plan: Return if symptoms worsen or fail to improve.   Continue healthy lifestyle choices, including diet (rich in fruits, vegetables, and lean proteins, and low in salt and simple carbohydrates) and exercise (at least 30 minutes of moderate physical activity daily).  Educational handout given for gout  The above assessment and management plan was discussed with the patient. The patient verbalized understanding of and has agreed to the management plan. Patient is aware to call the clinic if they develop any new symptoms or if symptoms persist or worsen. Patient is aware when to return to the clinic for a follow-up visit. Patient educated on when it is appropriate to go to the emergency department.   Kari Baars, FNP-C [facility] [phone]

## 2022-08-04 ENCOUNTER — Ambulatory Visit (INDEPENDENT_AMBULATORY_CARE_PROVIDER_SITE_OTHER): Payer: Medicare Other

## 2022-08-04 DIAGNOSIS — I4892 Unspecified atrial flutter: Secondary | ICD-10-CM | POA: Diagnosis not present

## 2022-08-04 LAB — CUP PACEART REMOTE DEVICE CHECK
Date Time Interrogation Session: 20240429004200
Implantable Pulse Generator Implant Date: 20240126
Pulse Gen Serial Number: 104324

## 2022-08-06 NOTE — Progress Notes (Signed)
[facility] Summary Report / Loop Recorder 

## 2022-08-11 ENCOUNTER — Encounter: Payer: Self-pay | Admitting: Family

## 2022-08-11 ENCOUNTER — Ambulatory Visit (INDEPENDENT_AMBULATORY_CARE_PROVIDER_SITE_OTHER): Payer: Medicare Other

## 2022-08-11 ENCOUNTER — Other Ambulatory Visit: Payer: Self-pay | Admitting: Family

## 2022-08-11 ENCOUNTER — Ambulatory Visit (INDEPENDENT_AMBULATORY_CARE_PROVIDER_SITE_OTHER): Payer: Medicare Other | Admitting: Family

## 2022-08-11 VITALS — BP 130/57 | HR 93 | Temp 97.5°F | Ht 66.0 in | Wt 139.0 lb

## 2022-08-11 DIAGNOSIS — Z720 Tobacco use: Secondary | ICD-10-CM

## 2022-08-11 DIAGNOSIS — I152 Hypertension secondary to endocrine disorders: Secondary | ICD-10-CM | POA: Diagnosis not present

## 2022-08-11 DIAGNOSIS — R531 Weakness: Secondary | ICD-10-CM

## 2022-08-11 DIAGNOSIS — R911 Solitary pulmonary nodule: Secondary | ICD-10-CM | POA: Diagnosis not present

## 2022-08-11 DIAGNOSIS — J449 Chronic obstructive pulmonary disease, unspecified: Secondary | ICD-10-CM

## 2022-08-11 DIAGNOSIS — R052 Subacute cough: Secondary | ICD-10-CM

## 2022-08-11 DIAGNOSIS — E1142 Type 2 diabetes mellitus with diabetic polyneuropathy: Secondary | ICD-10-CM

## 2022-08-11 DIAGNOSIS — R059 Cough, unspecified: Secondary | ICD-10-CM | POA: Diagnosis not present

## 2022-08-11 DIAGNOSIS — E1159 Type 2 diabetes mellitus with other circulatory complications: Secondary | ICD-10-CM

## 2022-08-11 DIAGNOSIS — F17210 Nicotine dependence, cigarettes, uncomplicated: Secondary | ICD-10-CM

## 2022-08-11 LAB — GLUCOSE HEMOCUE WAIVED: Glu Hemocue Waived: 323 mg/dL — ABNORMAL HIGH (ref 70–99)

## 2022-08-11 LAB — HEMOGLOBIN, FINGERSTICK: Hemoglobin: 16.4 g/dL (ref 12.6–17.7)

## 2022-08-11 MED ORDER — FREESTYLE LIBRE 3 SENSOR MISC
1.0000 | 2 refills | Status: DC
Start: 2022-08-11 — End: 2023-10-20

## 2022-08-11 MED ORDER — TRESIBA FLEXTOUCH 200 UNIT/ML ~~LOC~~ SOPN
6.0000 [IU] | PEN_INJECTOR | Freq: Every evening | SUBCUTANEOUS | 1 refills | Status: DC
Start: 2022-08-11 — End: 2022-08-15

## 2022-08-11 MED ORDER — FREESTYLE LIBRE 3 READER DEVI
1.0000 | 3 refills | Status: DC
Start: 2022-08-11 — End: 2023-10-20

## 2022-08-11 NOTE — Progress Notes (Signed)
Subjective:    Patient ID: John [name], male    DOB: [birth year], 77 y.o.   MRN: [id number]  Chief Complaint  Patient presents with   Fall    Hit head on shower Saturday night    Pt presents to the office today with weakness over the last week. He reports on Saturday he was washing his hands and fell and his head on the tub.   He has NSCL and followed by Oncologists.  Fall The accident occurred 3 to 5 days ago. He landed on Hard floor. There was no blood loss. The point of impact was the head. The pain is present in the head. The pain is at a severity of 2/10. The pain is mild. Pertinent negatives include no fever or headaches. He has tried rest for the symptoms. The treatment provided mild relief.  Diabetes He presents for his follow-up diabetic visit. He has type 2 diabetes mellitus. Pertinent negatives for hypoglycemia include no headaches. Associated symptoms include blurred vision, fatigue and foot paresthesias. Symptoms are stable. Diabetic complications include peripheral neuropathy. Risk factors for coronary artery disease include dyslipidemia, diabetes mellitus, hypertension, male sex and sedentary lifestyle. He is following a generally unhealthy diet. His overall blood glucose range is >200 mg/dl.  Cough This is a new problem. The current episode started in the past 7 days. The problem has been waxing and waning. The problem occurs every few minutes. The cough is Productive of sputum. Associated symptoms include nasal congestion, postnasal drip, shortness of breath and wheezing. Pertinent negatives include no chills, ear congestion, ear pain, fever or headaches. Risk factors for lung disease include smoking/tobacco exposure. He has tried rest for the symptoms. The treatment provided mild relief. His past medical history is significant for COPD.  Nicotine Dependence Presents for follow-up visit. Symptoms include fatigue. His urge triggers include company of smokers. The symptoms  have been stable. He smokes < 1/2 a pack of cigarettes per day.  Congestive Heart Failure Presents for follow-up visit. Associated symptoms include fatigue and shortness of breath. Pertinent negatives include no edema. The symptoms have been stable.      Review of Systems  Constitutional:  Positive for fatigue. Negative for chills and fever.  HENT:  Positive for postnasal drip. Negative for ear pain.   Eyes:  Positive for blurred vision.  Respiratory:  Positive for cough, shortness of breath and wheezing.   Neurological:  Negative for headaches.  All other systems reviewed and are negative.      Objective:   Physical Exam Vitals reviewed.  Constitutional:      General: He is not in acute distress.    Appearance: He is well-developed. He is obese.  HENT:     Head: Normocephalic.     Right Ear: Tympanic membrane normal.     Left Ear: Tympanic membrane normal.  Eyes:     General:        Right eye: No discharge.        Left eye: No discharge.     Pupils: Pupils are equal, round, and reactive to light.  Neck:     Thyroid: No thyromegaly.  Cardiovascular:     Rate and Rhythm: Normal rate and regular rhythm.     Heart sounds: Normal heart sounds. No murmur heard. Pulmonary:     Effort: Pulmonary effort is normal. No respiratory distress.     Breath sounds: Wheezing present.  Abdominal:     General: Bowel sounds are normal. There is  no distension.     Palpations: Abdomen is soft.     Tenderness: There is no abdominal tenderness.  Musculoskeletal:        General: No tenderness. Normal range of motion.     Cervical back: Normal range of motion and neck supple.  Skin:    General: Skin is warm and dry.     Findings: No erythema or rash.  Neurological:     Mental Status: He is alert and oriented to person, place, and time.     Cranial Nerves: No cranial nerve deficit.     Motor: Weakness present.     Gait: Gait abnormal.     Deep Tendon Reflexes: Reflexes are normal and  symmetric.  Psychiatric:        Behavior: Behavior normal.        Thought Content: Thought content normal.        Judgment: Judgment normal.     BP (!) 130/57   Pulse 93   Temp (!) 97.5 F (36.4 C) (Temporal)   Ht 5\' 6"  (1.676 m)   Wt 139 lb (63 kg)   SpO2 94%   BMI 22.44 kg/m        Assessment & Plan:  John [name] comes in today with chief complaint of Fall (Hit head on shower Saturday night )   Diagnosis and orders addressed:  1. Weak - Hemoglobin, fingerstick - Glucose Hemocue Waived - CBC with Differential/Platelet - CMP14+EGFR - DG Chest 2 View  2. Chronic obstructive pulmonary disease, unspecified COPD type (HCC) - CBC with Differential/Platelet - CMP14+EGFR - DG Chest 2 View  3. Type 2 diabetes mellitus with diabetic polyneuropathy, without long-term current use of insulin (HCC) - Continuous Glucose Receiver (FREESTYLE LIBRE 3 READER) DEVI; 1 Device by Does not apply route every 14 (fourteen) days.  Dispense: 1 each; Refill: 3 - Continuous Glucose Sensor (FREESTYLE LIBRE 3 SENSOR) MISC; 1 Device by Does not apply route continuous.  Dispense: 1 each; Refill: 2 - insulin degludec (TRESIBA FLEXTOUCH) 200 UNIT/ML FlexTouch Pen; Inject 6 Units into the skin every evening.  Dispense: 3 mL; Refill: 1 - CBC with Differential/Platelet - CMP14+EGFR  4. Hypertension associated with diabetes (HCC) - CBC with Differential/Platelet - CMP14+EGFR  5. Nicotine dependence, cigarettes, uncomplicated - CBC with Differential/Platelet - CMP14+EGFR  6. Tobacco abuse - CBC with Differential/Platelet - CMP14+EGFR - DG Chest 2 View  7. Subacute cough - CBC with Differential/Platelet - CMP14+EGFR - DG Chest 2 View  Chest x-ray pending Start Tresiba 6 units every day Low carb diet  Fall precautions discussed  Labs pending Health Maintenance reviewed Diet and exercise encouraged  Follow up plan: Keep chronic follow up   Jannifer Rodney, FNP

## 2022-08-11 NOTE — Patient Instructions (Signed)
Weakness Weakness is a lack of strength. You may feel weak all over your body (generalized), or you may feel weak in one part of your body (focal). Common causes of weakness include: Infection and disorders of the body's defense system (immune system). Physical exhaustion. Internal bleeding or other blood loss that results in a lack of red blood cells (anemia). Dehydration. An imbalance in mineral (electrolyte) levels, such as potassium. Chronic kidney or liver disease. Cancer. Other causes include: Some medicines or cancer treatment. Stress, anxiety, or depression. Heart disease, circulation problems, or stroke. Nervous system disorders. Thyroid disorders. Loss of muscle strength because of age or inactivity. Poor sleep quality or sleep disorders. The cause of your weakness may not be known. Some causes of weakness can be serious, so it is important to see your health care provider. Follow these instructions at home: Activity Rest as needed. Try to get enough sleep. Most adults need 7-8 hours of quality sleep each night. Talk to your health care provider about how much sleep you need. Do exercises, such as arm curls and leg raises, for 30 minutes at least 2 days a week or as told by your health care provider. This helps build muscle strength. Consider working with a physical therapist or trainer who can develop an exercise plan to help you gain muscle strength. General instructions  Take over-the-counter and prescription medicines only as told by your health care provider. Eat a healthy, well-balanced diet. This includes: Proteins to build muscles, such as lean meats and fish. Fresh fruits and vegetables. Carbohydrates to boost energy, such as whole grains. Drink enough fluid to keep your urine pale yellow. Keep all follow-up visits. This is important. Contact a health care provider if: Your weakness does not improve or gets worse. Your weakness affects your ability to think  clearly. Your weakness affects your ability to do your normal daily activities. Get help right away if: You develop sudden weakness, especially on one side of your face or body. You have chest pain. You have trouble breathing or shortness of breath. You have problems with your vision. You have trouble talking or swallowing. You have trouble standing or walking. You are light-headed or lose consciousness. These symptoms may be an emergency. Get help right away. Call 911. Do not wait to see if the symptoms will go away. Do not drive yourself to the hospital. Summary Weakness is a lack of strength. You may feel weak all over your body or just in one specific part of your body. Weakness can be caused by a variety of things. In some cases, the cause may be unknown. Rest as needed, and try to get enough sleep. Most adults need 7-8 hours of quality sleep each night. Eat a healthy, well-balanced diet. This information is not intended to replace advice given to you by your health care provider. Make sure you discuss any questions you have with your health care provider. Document Revised: 02/24/2021 Document Reviewed: 02/24/2021 Elsevier Patient Education  2023 [employer].  

## 2022-08-12 ENCOUNTER — Other Ambulatory Visit: Payer: Self-pay | Admitting: Family

## 2022-08-12 LAB — CBC WITH DIFFERENTIAL/PLATELET
Basophils Absolute: 0.1 10*3/uL (ref 0.0–0.2)
Basos: 1 %
EOS (ABSOLUTE): 0.2 10*3/uL (ref 0.0–0.4)
Eos: 1 %
Hematocrit: 50.1 % (ref 37.5–51.0)
Hemoglobin: 17 g/dL (ref 13.0–17.7)
Immature Grans (Abs): 0.2 10*3/uL — ABNORMAL HIGH (ref 0.0–0.1)
Immature Granulocytes: 1 %
Lymphocytes Absolute: 0.7 10*3/uL (ref 0.7–3.1)
Lymphs: 4 %
MCH: 32.6 pg (ref 26.6–33.0)
MCHC: 33.9 g/dL (ref 31.5–35.7)
MCV: 96 fL (ref 79–97)
Monocytes Absolute: 1.2 10*3/uL — ABNORMAL HIGH (ref 0.1–0.9)
Monocytes: 8 %
Neutrophils Absolute: 13 10*3/uL — ABNORMAL HIGH (ref 1.4–7.0)
Neutrophils: 85 %
Platelets: 356 10*3/uL (ref 150–450)
RBC: 5.21 x10E6/uL (ref 4.14–5.80)
RDW: 13.6 % (ref 11.6–15.4)
WBC: 15.3 10*3/uL — ABNORMAL HIGH (ref 3.4–10.8)

## 2022-08-12 LAB — CMP14+EGFR
ALT: 23 IU/L (ref 0–44)
AST: 20 IU/L (ref 0–40)
Albumin/Globulin Ratio: 1.4 (ref 1.2–2.2)
Albumin: 4.3 g/dL (ref 3.8–4.8)
Alkaline Phosphatase: 160 IU/L — ABNORMAL HIGH (ref 44–121)
BUN/Creatinine Ratio: 39 — ABNORMAL HIGH (ref 10–24)
BUN: 52 mg/dL — ABNORMAL HIGH (ref 8–27)
Bilirubin Total: 0.3 mg/dL (ref 0.0–1.2)
CO2: 22 mmol/L (ref 20–29)
Calcium: 9.6 mg/dL (ref 8.6–10.2)
Chloride: 89 mmol/L — ABNORMAL LOW (ref 96–106)
Creatinine, Ser: 1.34 mg/dL — ABNORMAL HIGH (ref 0.76–1.27)
Globulin, Total: 3 g/dL (ref 1.5–4.5)
Glucose: 287 mg/dL — ABNORMAL HIGH (ref 70–99)
Potassium: 5.3 mmol/L — ABNORMAL HIGH (ref 3.5–5.2)
Sodium: 130 mmol/L — ABNORMAL LOW (ref 134–144)
Total Protein: 7.3 g/dL (ref 6.0–8.5)
eGFR: 55 mL/min/{1.73_m2} — ABNORMAL LOW (ref >59)

## 2022-08-12 MED ORDER — LEVOFLOXACIN 500 MG PO TABS: 500.0000 mg | ORAL_TABLET | Freq: Every day | ORAL | 0 refills | Status: AC

## 2022-08-15 ENCOUNTER — Encounter: Payer: Self-pay | Admitting: Family

## 2022-08-15 ENCOUNTER — Ambulatory Visit (INDEPENDENT_AMBULATORY_CARE_PROVIDER_SITE_OTHER): Payer: Medicare Other | Admitting: Family

## 2022-08-15 VITALS — BP 108/60 | HR 110 | Temp 97.4°F | Ht 66.0 in | Wt 141.6 lb

## 2022-08-15 DIAGNOSIS — Z794 Long term (current) use of insulin: Secondary | ICD-10-CM

## 2022-08-15 DIAGNOSIS — D72829 Elevated white blood cell count, unspecified: Secondary | ICD-10-CM | POA: Diagnosis not present

## 2022-08-15 DIAGNOSIS — R531 Weakness: Secondary | ICD-10-CM | POA: Diagnosis not present

## 2022-08-15 DIAGNOSIS — E1142 Type 2 diabetes mellitus with diabetic polyneuropathy: Secondary | ICD-10-CM | POA: Diagnosis not present

## 2022-08-15 DIAGNOSIS — E875 Hyperkalemia: Secondary | ICD-10-CM | POA: Diagnosis not present

## 2022-08-15 LAB — BMP8+EGFR: Chloride: 90 mmol/L — ABNORMAL LOW (ref 96–106)

## 2022-08-15 LAB — CBC WITH DIFFERENTIAL/PLATELET

## 2022-08-15 MED ORDER — TRESIBA FLEXTOUCH 200 UNIT/ML ~~LOC~~ SOPN
10.0000 [IU] | PEN_INJECTOR | Freq: Every evening | SUBCUTANEOUS | 1 refills | Status: DC
Start: 2022-08-15 — End: 2022-09-23

## 2022-08-15 NOTE — Progress Notes (Signed)
Subjective:    Patient ID: John [name], male    DOB: 10/06/1945, 77 y.o.   MRN: [id number]  Chief Complaint  Patient presents with   Medical Management of Chronic Issues   Leg Pain    Right    PT presents to the office today to follow up on weakness and elevated WBC. He was placed on Levaquin.  His WBC was elevated to 15.3. His glucose had also been elevated and we started him on Tresiba 6 units. His glucose this AM was 305.  He was also found to have hyperkalemia and his potassium stopped.  Leg Pain   Cough This is a chronic problem. The current episode started more than 1 year ago. The problem has been waxing and waning. The problem occurs every few minutes. The cough is Non-productive.  Diabetes He presents for his follow-up diabetic visit. He has type 2 diabetes mellitus. Pertinent negatives for diabetes include no blurred vision and no foot paresthesias. Symptoms are stable. Risk factors for coronary artery disease include dyslipidemia, diabetes mellitus, hypertension and sedentary lifestyle. He is following a generally unhealthy diet. His overall blood glucose range is >200 mg/dl.      Review of Systems  Eyes:  Negative for blurred vision.  Respiratory:  Positive for cough.   All other systems reviewed and are negative.      Objective:   Physical Exam Vitals reviewed.  Constitutional:      General: He is not in acute distress.    Appearance: He is well-developed.  HENT:     Head: Normocephalic.  Eyes:     General:        Right eye: No discharge.        Left eye: No discharge.     Pupils: Pupils are equal, round, and reactive to light.  Neck:     Thyroid: No thyromegaly.  Cardiovascular:     Rate and Rhythm: Normal rate and regular rhythm.     Heart sounds: Normal heart sounds. No murmur heard. Pulmonary:     Effort: Pulmonary effort is normal. No respiratory distress.     Breath sounds: Wheezing and rhonchi present.  Abdominal:     General: Bowel  sounds are normal. There is no distension.     Palpations: Abdomen is soft.     Tenderness: There is no abdominal tenderness.  Musculoskeletal:        General: No tenderness. Normal range of motion.     Cervical back: Normal range of motion and neck supple.  Skin:    General: Skin is warm and dry.     Findings: No erythema or rash.  Neurological:     Mental Status: He is alert and oriented to person, place, and time.     Cranial Nerves: No cranial nerve deficit.     Motor: Weakness (using cane) present.     Gait: Gait abnormal.     Deep Tendon Reflexes: Reflexes are normal and symmetric.  Psychiatric:        Behavior: Behavior normal.        Thought Content: Thought content normal.        Judgment: Judgment normal.     BP 108/60   Pulse (!) 110   Temp (!) 97.4 F (36.3 C) (Temporal)   Ht 5\' 6"  (1.676 m)   Wt 141 lb 9.6 oz (64.2 kg)   BMI 22.85 kg/m        Assessment & Plan:  John [name]  comes in today with chief complaint of Medical Management of Chronic Issues and Leg Pain (Right )   Diagnosis and orders addressed:  1. Type 2 diabetes mellitus with diabetic polyneuropathy, without long-term current use of insulin (HCC) Will increase Tresiba to 10 units from 6 units  - insulin degludec (TRESIBA FLEXTOUCH) 200 UNIT/ML FlexTouch Pen; Inject 10 Units into the skin every evening.  Dispense: 3 mL; Refill: 1 - CBC with Differential/Platelet - BMP8+EGFR  2. Leukocytosis, unspecified type - CBC with Differential/Platelet - BMP8+EGFR  3. Weakness - CBC with Differential/Platelet - BMP8+EGFR  4. Hyperkalemia - CBC with Differential/Platelet - BMP8+EGFR   Labs pending Continue Levaquin  Force fluids Health Maintenance reviewed Diet and exercise encouraged  Follow up plan: 1 month  Jannifer Rodney, FNP

## 2022-08-15 NOTE — Patient Instructions (Signed)

## 2022-08-16 LAB — CBC WITH DIFFERENTIAL/PLATELET
Basophils Absolute: 0.1 10*3/uL (ref 0.0–0.2)
Basos: 0 %
Eos: 1 %
Hematocrit: 46.2 % (ref 37.5–51.0)
Hemoglobin: 15.2 g/dL (ref 13.0–17.7)
Immature Grans (Abs): 0.1 10*3/uL (ref 0.0–0.1)
Immature Granulocytes: 1 %
Lymphocytes Absolute: 0.5 10*3/uL — ABNORMAL LOW (ref 0.7–3.1)
MCH: 32 pg (ref 26.6–33.0)
MCHC: 32.9 g/dL (ref 31.5–35.7)
MCV: 97 fL (ref 79–97)
Monocytes Absolute: 1.1 10*3/uL — ABNORMAL HIGH (ref 0.1–0.9)
Monocytes: 7 %
Neutrophils Absolute: 14 10*3/uL — ABNORMAL HIGH (ref 1.4–7.0)
Neutrophils: 88 %
Platelets: 317 10*3/uL (ref 150–450)
RDW: 13.7 % (ref 11.6–15.4)
WBC: 15.9 10*3/uL — ABNORMAL HIGH (ref 3.4–10.8)

## 2022-08-16 LAB — BMP8+EGFR
BUN/Creatinine Ratio: 27 — ABNORMAL HIGH (ref 10–24)
BUN: 39 mg/dL — ABNORMAL HIGH (ref 8–27)
Creatinine, Ser: 1.47 mg/dL — ABNORMAL HIGH (ref 0.76–1.27)
Glucose: 348 mg/dL — ABNORMAL HIGH (ref 70–99)
Potassium: 4.4 mmol/L (ref 3.5–5.2)
Sodium: 131 mmol/L — ABNORMAL LOW (ref 134–144)
eGFR: 49 mL/min/{1.73_m2} — ABNORMAL LOW (ref >59)

## 2022-08-18 ENCOUNTER — Telehealth: Payer: Self-pay | Admitting: Family

## 2022-08-18 MED ORDER — MAGIC MOUTHWASH W/LIDOCAINE: 10.0000 mL | Freq: Three times a day (TID) | ORAL | 0 refills | Status: DC | PRN

## 2022-08-18 MED ORDER — ONDANSETRON HCL 4 MG PO TABS: 4.0000 mg | ORAL_TABLET | Freq: Three times a day (TID) | ORAL | 0 refills | Status: DC | PRN

## 2022-08-18 NOTE — Telephone Encounter (Signed)
He needs to finish this antibiotic.  Zofran Prescription sent to pharmacy, take this 30 mins prior to taking Levofloxacin. I have also sent in Magic Mouthwash to help with mouth.   Jannifer Rodney, FNP

## 2022-08-18 NOTE — Telephone Encounter (Signed)
Pt wants to speak to PCPs nurse. Says he can't take LEBOFLOXACIN because it is breaking his mouth out and making him nauseous and vomit.

## 2022-08-18 NOTE — Telephone Encounter (Signed)
Patient aware and verbalized understanding. °

## 2022-08-19 MED ORDER — NYSTATIN 100000 UNIT/ML MT SUSP: 5.0000 mL | Freq: Four times a day (QID) | OROMUCOSAL | 0 refills | Status: DC

## 2022-08-19 NOTE — Addendum Note (Signed)
Addended by: Jannifer Rodney A on: 08/19/2022 01:24 PM   Modules accepted: Orders

## 2022-08-19 NOTE — Telephone Encounter (Signed)
Patient aware and verbalizes understanding. 

## 2022-08-19 NOTE — Telephone Encounter (Signed)
Nystatin solution Prescription sent to pharmacy   

## 2022-08-19 NOTE — Telephone Encounter (Signed)
Pt says miracle mouth wash is on back order at the pharmacy. Needs advise on what to do.

## 2022-08-20 ENCOUNTER — Telehealth: Payer: Self-pay | Admitting: Family

## 2022-08-20 ENCOUNTER — Other Ambulatory Visit: Payer: Self-pay | Admitting: Nurse Practitioner

## 2022-08-20 DIAGNOSIS — D72829 Elevated white blood cell count, unspecified: Secondary | ICD-10-CM

## 2022-08-20 NOTE — Telephone Encounter (Signed)
Patient finished his antibiotic on 5/15 that he was given due to having a high white blood cell count and wants to know if he needs to have his labs checked again. Please call back and advise.

## 2022-08-20 NOTE — Telephone Encounter (Signed)
Left message making pt aware that John [name] is out of the office today but will review with her in clinic tomorrow. Advised pt to call back if needed.

## 2022-08-20 NOTE — Telephone Encounter (Signed)
Order place to repeat CBC- needs  to make appt to have drawn

## 2022-08-22 ENCOUNTER — Telehealth: Payer: Self-pay | Admitting: Family

## 2022-08-22 NOTE — Telephone Encounter (Signed)
Pt needs to speak to triage. Says he has felt shaky today and has fallen twice. He said he checked his BS and it was 117. Will check his BP while waiting on call back.

## 2022-08-22 NOTE — Telephone Encounter (Signed)
Left message to call back  

## 2022-08-25 ENCOUNTER — Encounter: Payer: Self-pay | Admitting: Nurse Practitioner

## 2022-08-25 ENCOUNTER — Ambulatory Visit (INDEPENDENT_AMBULATORY_CARE_PROVIDER_SITE_OTHER): Payer: Medicare Other | Admitting: Nurse Practitioner

## 2022-08-25 VITALS — BP 125/78 | HR 101 | Ht 66.0 in | Wt 142.2 lb

## 2022-08-25 DIAGNOSIS — E782 Mixed hyperlipidemia: Secondary | ICD-10-CM

## 2022-08-25 DIAGNOSIS — E1142 Type 2 diabetes mellitus with diabetic polyneuropathy: Secondary | ICD-10-CM

## 2022-08-25 DIAGNOSIS — Z7984 Long term (current) use of oral hypoglycemic drugs: Secondary | ICD-10-CM | POA: Diagnosis not present

## 2022-08-25 DIAGNOSIS — E039 Hypothyroidism, unspecified: Secondary | ICD-10-CM

## 2022-08-25 NOTE — Telephone Encounter (Signed)
Left message to call back  

## 2022-08-25 NOTE — Progress Notes (Signed)
Endocrinology Follow Up Note       08/25/2022, 2:57 PM   Subjective:    Patient ID: John [name], male    DOB: 1945/12/05.  John [name] is being seen in follow up after being seen in consultation for management of currently uncontrolled symptomatic diabetes requested by  Junie Spencer, FNP.   Past Medical History:  Diagnosis Date   Adenocarcinoma of lung, right (HCC) 04/18/2016   Anemia    Anxiety    Arthritis    Asthma    Atrial flutter (HCC)    BPH (benign prostatic hyperplasia)    with urinary retention 02/06/20   CHF (congestive heart failure) (HCC)    COPD (chronic obstructive pulmonary disease) (HCC)    Depression    Diabetes mellitus, type II (HCC)    Dyspnea    History of kidney stones    History of radiation therapy    right lung 09/10/2020-09/17/2020   Dr Roselind Messier   History of radiation therapy    Left and right Lung- 06/19/22-06/26/22   Hyperlipidemia    Hypertension    Hypothyroidism    Macular degeneration    Neuropathy    Non-small cell lung cancer, right (HCC) 04/18/2016   Peripheral vascular disease (HCC)    Prostatitis    Pulmonary nodule, left 07/16/2016   Sleep apnea    cpap    Past Surgical History:  Procedure Laterality Date   A-FLUTTER ABLATION N/A 01/13/2022   Procedure: A-FLUTTER ABLATION;  Surgeon: Lanier Prude, MD;  Location: [facility];  Service: Cardiovascular;  Laterality: N/A;   ABDOMINAL AORTOGRAM W/LOWER EXTREMITY Left 02/06/2020   Procedure: ABDOMINAL AORTOGRAM W/LOWER EXTREMITY;  Surgeon: Runell Gess, MD;  Location: [facility];  Service: Cardiovascular;  Laterality: Left;   ABDOMINAL AORTOGRAM W/LOWER EXTREMITY N/A 05/30/2021   Procedure: ABDOMINAL AORTOGRAM W/LOWER EXTREMITY;  Surgeon: Cephus Shelling, MD;  Location: [facility];  Service: Cardiovascular;  Laterality: N/A;   BIOPSY  09/16/2021   Procedure: BIOPSY;   Surgeon: Iva Boop, MD;  Location: [facility] ENDOSCOPY;  Service: Gastroenterology;;   CARDIOVERSION N/A 10/05/2020   Procedure: CARDIOVERSION;  Surgeon: Sande Rives, MD;  Location: [facility] ENDOSCOPY;  Service: Cardiovascular;  Laterality: N/A;   CATARACT EXTRACTION, BILATERAL Bilateral    COLONOSCOPY WITH PROPOFOL N/A 06/20/2021   Procedure: COLONOSCOPY WITH PROPOFOL;  Surgeon: Hilarie Fredrickson, MD;  Location: [facility] ENDOSCOPY;  Service: Endoscopy;  Laterality: N/A;   COLONOSCOPY WITH PROPOFOL N/A 09/16/2021   Procedure: COLONOSCOPY WITH PROPOFOL;  Surgeon: Iva Boop, MD;  Location: [facility] ENDOSCOPY;  Service: Gastroenterology;  Laterality: N/A;   ENDARTERECTOMY FEMORAL Right 08/20/2020   Procedure: ENDARTERECTOMY  RIGHT FEMORAL ARTERY;  Surgeon: Cephus Shelling, MD;  Location: [facility] OR;  Service: Vascular;  Laterality: Right;   ENTEROSCOPY N/A 10/11/2021   Procedure: ENTEROSCOPY;  Surgeon: Dolores Frame, MD;  Location: [facility] ENDO SU[facility];  Service: Gastroenterology;  Laterality: N/A;   ESOPHAGOGASTRODUODENOSCOPY N/A 09/16/2021   Procedure: ESOPHAGOGASTRODUODENOSCOPY (EGD);  Surgeon: Iva Boop, MD;  Location: [facility] ENDOSCOPY;  Service: Gastroenterology;  Laterality: N/A;   ESOPHAGOGASTRODUODENOSCOPY (EGD) WITH PROPOFOL N/A 09/06/2020   Procedure: ESOPHAGOGASTRODUODENOSCOPY (  EGD) WITH PROPOFOL;  Surgeon: Hilarie Fredrickson, MD;  Location: [facility] ENDOSCOPY;  Service: Endoscopy;  Laterality: N/A;   ESOPHAGOGASTRODUODENOSCOPY (EGD) WITH PROPOFOL N/A 10/11/2021   Procedure: ESOPHAGOGASTRODUODENOSCOPY (EGD) WITH PROPOFOL;  Surgeon: Dolores Frame, MD;  Location: [facility];  Service: Gastroenterology;  Laterality: N/A;   HEMOSTASIS CLIP PLACEMENT  09/16/2021   Procedure: HEMOSTASIS CLIP PLACEMENT;  Surgeon: Iva Boop, MD;  Location: [facility] ENDOSCOPY;  Service: Gastroenterology;;   HOT HEMOSTASIS N/A 09/16/2021   Procedure: HOT HEMOSTASIS (ARGON PLASMA COAGULATION/BICAP);  Surgeon: Iva Boop, MD;  Location: [facility] ENDOSCOPY;  Service: Gastroenterology;  Laterality: N/A;   HOT HEMOSTASIS  10/11/2021   Procedure: HOT HEMOSTASIS (ARGON PLASMA COAGULATION/BICAP);  Surgeon: Marguerita Merles, Reuel Boom, MD;  Location: [facility];  Service: Gastroenterology;;   INSERTION OF ILIAC STENT Right 08/20/2020   Procedure: RETROGRADE INSERTION OF RIGHT ILIAC STENT;  Surgeon: Cephus Shelling, MD;  Location: [facility] OR;  Service: Vascular;  Laterality: Right;   INTRAOPERATIVE ARTERIOGRAM Right 08/20/2020   Procedure: INTRA OPERATIVE ARTERIOGRAM ILIAC;  Surgeon: Cephus Shelling, MD;  Location: [facility] OR;  Service: Vascular;  Laterality: Right;   PATCH ANGIOPLASTY Right 08/20/2020   Procedure: PATCH ANGIOPLASTY RIGHT FEMORAL ARTERY;  Surgeon: Cephus Shelling, MD;  Location: [facility] OR;  Service: Vascular;  Laterality: Right;   PERIPHERAL VASCULAR BALLOON ANGIOPLASTY Right 05/30/2021   Procedure: PERIPHERAL VASCULAR BALLOON ANGIOPLASTY;  Surgeon: Cephus Shelling, MD;  [facility]: [facility];  Service: Cardiovascular;  Laterality: Right;   PORTACATH PLACEMENT Left 06/13/2016   Procedure: INSERTION PORT-A-CATH;  Surgeon: Franky Macho, MD;  Location: [facility] ORS;  Service: General;  Laterality: Left;   TRANSURETHRAL RESECTION OF PROSTATE N/A 05/31/2020   Procedure: TRANSURETHRAL RESECTION OF THE PROSTATE (TURP);  Surgeon: Malen Gauze, MD;  Location: [facility] ORS;  Service: Urology;  Laterality: N/A;   VIDEO BRONCHOSCOPY WITH ENDOBRONCHIAL NAVIGATION N/A 05/28/2016   Procedure: VIDEO BRONCHOSCOPY WITH ENDOBRONCHIAL NAVIGATION;  Surgeon: Loreli Slot, MD;  Location: [facility] OR;  Service: Thoracic;  Laterality: N/A;   VIDEO BRONCHOSCOPY WITH ENDOBRONCHIAL ULTRASOUND N/A 05/28/2016   Procedure: VIDEO BRONCHOSCOPY WITH ENDOBRONCHIAL ULTRASOUND;  Surgeon: Loreli Slot, MD;  Location: [facility] OR;  Service: Thoracic;  Laterality: N/A;    Social History   Socioeconomic History   Marital status: Human resources officer    Spouse name: Not on file   Number of children: 5   Years of education: 10   Highest education level: 10th grade  Occupational History   Occupation: Retired  Tobacco Use   Smoking status: Every Day    Packs/day: 0.50    Years: 55.00    Additional pack years: 0.00    Total pack years: 27.50    Types: Cigarettes    Start date: 03/11/1961   Smokeless tobacco: Never   Tobacco comments:    1/2 pack to 1 pack per day 12/14/19  Vaping Use   Vaping Use: Never used  Substance and Sexual Activity   Alcohol use: No   Drug use: No   Sexual activity: Yes    Birth control/protection: None  Other Topics Concern   Not on file  Social History Narrative   [name] is his POA and lives with him. Children out of town - one in [facility], others in [place].   Social Determinants of Health   Financial Resource Strain: Low Risk  (11/15/2021)   Overall Financial Resource Strain (CARDIA)    Difficulty of Paying Living  Expenses: Not hard at all  Food Insecurity: No Food Insecurity (05/26/2022)   Hunger Vital Sign    Worried About Running Out of Food in the Last Year: Never true    Ran Out of Food in the Last Year: Never true  Transportation Needs: No Transportation Needs (05/26/2022)   PRAPARE - Administrator, Civil Service (Medical): No    Lack of Transportation (Non-Medical): No  Physical Activity: Inactive (11/15/2021)   Exercise Vital Sign    Days of Exercise per Week: 0 days    Minutes of Exercise per Session: 0 min  Stress: No Stress Concern Present (11/15/2021)   [employer] of Occupational Health - Occupational Stress Questionnaire    Feeling of Stress : Only a little  Social Connections: Moderately Isolated (11/15/2021)   Social Connection and Isolation Panel [NHANES]    Frequency of Communication with Friends and Family: More than three times a week    Frequency of Social Gatherings with Friends and Family: Once a week    Attends Religious Services: Never     Database administrator or Organizations: No    Attends Engineer, structural: Never    Marital Status: Living with partner    Family History  Problem Relation Age of Onset   Hypertension Mother    Diabetes Father    Heart disease Father    Stroke Father    Hypertension Sister     Outpatient Encounter Medications as of 08/25/2022  Medication Sig   allopurinol (ZYLOPRIM) 100 MG tablet Take 1 tablet (100 mg total) by mouth daily.   aspirin EC 81 MG tablet Take 1 tablet (81 mg total) by mouth daily. Swallow whole.   Blood Glucose Monitoring Suppl (ONETOUCH VERIO REFLECT) w/Device KIT Use to test blood sugars daily as directed. DX: E11.9   cetirizine (ZYRTEC ALLERGY) 10 MG tablet Take 1 tablet (10 mg total) by mouth daily.   dutasteride (AVODART) 0.5 MG capsule Take 1 capsule (0.5 mg total) by mouth daily.   ezetimibe (ZETIA) 10 MG tablet TAKE 1 TABLET BY MOUTH EVERY DAY   famotidine (PEPCID) 20 MG tablet One after supper (Patient taking differently: Take 20 mg by mouth daily with supper.)   fluticasone (FLONASE) 50 MCG/ACT nasal spray PLACE 1 SPRAY INTO BOTH NOSTRILS DAILY AS NEEDED FOR ALLERGIES OR RHINITIS.   Fluticasone-Umeclidin-Vilant (TRELEGY ELLIPTA) 100-62.5-25 MCG/ACT AEPB Inhale 1 puff into the lungs daily.   insulin degludec (TRESIBA FLEXTOUCH) 200 UNIT/ML FlexTouch Pen Inject 10 Units into the skin every evening.   KLOR-CON M20 20 MEQ tablet TAKE 1 TABLET BY MOUTH EVERY DAY   levothyroxine (SYNTHROID) 50 MCG tablet TAKE 1 TABLET BY MOUTH EVERY DAY   linaclotide (LINZESS) 72 MCG capsule Take 1 capsule (72 mcg total) by mouth daily as needed (constipation).   magic mouthwash w/lidocaine SOLN Take 10 mLs by mouth 3 (three) times daily as needed for mouth pain.   metolazone (ZAROXOLYN) 2.5 MG tablet TAKE 1 TABLET BY MOUTH ON MONDAY, WEDNESDAY AND FRIDAY   metoprolol succinate (TOPROL XL) 25 MG 24 hr tablet Take 0.5 tablets (12.5 mg total) by mouth daily.   NON FORMULARY  CPAP at bedtime   nystatin (MYCOSTATIN) 100000 UNIT/ML suspension Take 5 mLs (500,000 Units total) by mouth 4 (four) times daily.   ondansetron (ZOFRAN) 4 MG tablet Take 1 tablet (4 mg total) by mouth every 8 (eight) hours as needed for nausea or vomiting.   OneTouch Delica Lancets 30G MISC Use  to test blood sugars daily as directed. DX: E11.9   ONETOUCH VERIO test strip USE TO TEST BLOOD SUGARS DAILY AS DIRECTED. DX: E11.9   OXYGEN Inhale 2 L into the lungs daily as needed (with Cpap at night).   pantoprazole (PROTONIX) 20 MG tablet Take 1 tablet (20 mg total) by mouth daily.   polyethylene glycol powder (GLYCOLAX/MIRALAX) 17 GM/SCOOP powder Take 17 g by mouth 2 (two) times daily as needed.   pravastatin (PRAVACHOL) 40 MG tablet TAKE 1 TABLET (40 MG TOTAL) BY MOUTH ON MONDAYS , WEDNESDAYS, AND FRIDAYS AS DIRECTED   spironolactone (ALDACTONE) 25 MG tablet Take 1 tablet (25 mg total) by mouth daily.   torsemide (DEMADEX) 20 MG tablet Take 1 tablet (20 mg total) by mouth daily.   zolpidem (AMBIEN) 5 MG tablet Take 1 tablet (5 mg total) by mouth at bedtime as needed for sleep.   [DISCONTINUED] dapagliflozin propanediol (FARXIGA) 5 MG TABS tablet Take 1 tablet (5 mg total) by mouth daily before breakfast.   [DISCONTINUED] predniSONE (DELTASONE) 20 MG tablet 2 po at sametime daily for 5 days-   [DISCONTINUED] Semaglutide (RYBELSUS) 14 MG TABS Take 1 tablet (14 mg total) by mouth daily.   Continuous Glucose Receiver (FREESTYLE LIBRE 3 READER) DEVI 1 Device by Does not apply route every 14 (fourteen) days. (Patient not taking: Reported on 08/25/2022)   Continuous Glucose Sensor (FREESTYLE LIBRE 3 SENSOR) MISC 1 Device by Does not apply route continuous. (Patient not taking: Reported on 08/25/2022)   gabapentin (NEURONTIN) 300 MG capsule TAKE 1 CAPSULE BY MOUTH THREE TIMES A DAY (Patient not taking: Reported on 08/25/2022)   No facility-administered encounter medications on file as of 08/25/2022.     ALLERGIES: Allergies  Allergen Reactions   Sulfa Antibiotics Swelling    Mouth swelling   Atorvastatin Other (See Comments)    Muscle aches - tolerating Pravastatin 40 mg MWF   Jardiance [Empagliflozin] Other (See Comments)    FEELS SLUGGISH, TIRED   Lopressor [Metoprolol] Other (See Comments)    Fatigue   Rosuvastatin Other (See Comments)    Muscle aches - tolerating Pravastatin 40 mg MWF   Doxycycline Nausea Only   Temazepam Other (See Comments)    Made insomnia worse     VACCINATION STATUS: Immunization History  Administered Date(s) Administered   Covid-19, Mrna,Vaccine(Spikevax)10yrs and older 03/28/2022   Fluad Quad(high Dose 65+) 01/19/2019, 02/01/2020, 01/23/2021, 01/28/2022   Influenza, High Dose Seasonal PF 12/28/2015   Influenza,inj,Quad PF,6+ Mos 01/23/2017   Moderna Sars-Covid-2 Vaccination 05/10/2019, 06/07/2019, 01/31/2020   Pneumococcal Conjugate-13 06/17/2017   Pneumococcal Polysaccharide-23 04/25/2019   Respiratory Syncytial Virus Vaccine,Recomb Aduvanted(Arexvy) 03/28/2022   Tdap 05/17/2019   Zoster Recombinat (Shingrix) 11/25/2021, 02/12/2022   Zoster, Live 07/10/2014    Diabetes He presents for his follow-up diabetic visit. He has type 2 diabetes mellitus. Onset time: Was diagnosed at approx age of 89. His disease course has been worsening. There are no hypoglycemic associated symptoms. Pertinent negatives for hypoglycemia include no nervousness/anxiousness or tremors. Associated symptoms include blurred vision and foot paresthesias. Pertinent negatives for diabetes include no fatigue, no polydipsia, no polyphagia and no weight loss. There are no hypoglycemic complications. Symptoms are stable. Diabetic complications include heart disease, nephropathy, peripheral neuropathy and PVD. Risk factors for coronary artery disease include diabetes mellitus, dyslipidemia, hypertension, male sex, sedentary lifestyle and tobacco exposure. Current diabetic treatment  includes oral agent (monotherapy). He is compliant with treatment most of the time. His weight is fluctuating minimally. He is  following a generally healthy diet. When asked about meal planning, he reported none. He has not had a previous visit with a dietitian. He rarely participates in exercise. His home blood glucose trend is increasing steadily. His breakfast blood glucose range is generally 180-200 mg/dl. His bedtime blood glucose range is generally >200 mg/dl. (He presents today with his meter and logs showing gross hyperglycemia overall.  His PCP recently checked his A1c on 3/15 which was 8.5%, increasing from last visit of 7.8%.  She started him on both Farxiga 5 mg po daily (has history of not tolerating Jardiance well) and Tresiba 12 units SQ nightly.  He denies any hypoglycemia but has been complaining of dizziness with position changes and has fallen multiple times.) An ACE inhibitor/angiotensin II receptor blocker is being taken. He sees a podiatrist.Eye exam is current.  Hyperlipidemia This is a chronic problem. The current episode started more than 1 year ago. The problem is controlled. Recent lipid tests were reviewed and are normal. Exacerbating diseases include chronic renal disease, diabetes and hypothyroidism. Factors aggravating his hyperlipidemia include fatty foods and beta blockers. Current antihyperlipidemic treatment includes statins. The current treatment provides moderate improvement of lipids. There are no compliance problems.  Risk factors for coronary artery disease include diabetes mellitus, dyslipidemia, hypertension, male sex and a sedentary lifestyle.  Hypertension This is a chronic problem. The current episode started more than 1 year ago. The problem has been gradually improving since onset. The problem is controlled. Associated symptoms include blurred vision and palpitations. Agents associated with hypertension include thyroid hormones. Risk factors for coronary artery  disease include diabetes mellitus, dyslipidemia, sedentary lifestyle, smoking/tobacco exposure and male gender. Past treatments include angiotensin blockers, diuretics, beta blockers and alpha 1 blockers. The current treatment provides moderate improvement. There are no compliance problems.  Hypertensive end-organ damage includes kidney disease, CAD/MI and PVD. Identifiable causes of hypertension include chronic renal disease, sleep apnea and a thyroid problem.  Thyroid Problem Presents for follow-up visit. Symptoms include palpitations. Patient reports no anxiety, cold intolerance, constipation, depressed mood, diarrhea, dry skin, fatigue, heat intolerance, tremors or weight loss. (Having cardioversion on 7/1 for Afib) The symptoms have been stable. Past treatments include levothyroxine. The treatment provided moderate relief. His past medical history is significant for diabetes and hyperlipidemia. There are no known risk factors.     Review of systems  Constitutional: + stable body weight,  current Body mass index is 22.95 kg/m. , + fatigue, no subjective hyperthermia, no subjective hypothermia Eyes: no blurry vision, no xerophthalmia ENT: no sore throat, no nodules palpated in throat, no dysphagia/odynophagia, no hoarseness Cardiovascular: no chest pain, no shortness of breath, no palpitations, no leg swelling Respiratory: no cough, no shortness of breath Gastrointestinal: no nausea/vomiting/diarrhea; + ongoing constipation Musculoskeletal: no muscle/joint aches Skin: no rashes, no hyperemia, + multiple abrasions from recent falls (multiple) Neurological: no tremors, + numbness/tingling to BLE, + dizziness with standing Psychiatric: no depression, no anxiety  Objective:     BP 125/78 (BP Location: Right Arm, Patient Position: Sitting, Cuff Size: Large)   Pulse (!) 101   Ht 5\' 6"  (1.676 m)   Wt 142 lb 3.2 oz (64.5 kg)   BMI 22.95 kg/m   Wt Readings from Last 3 Encounters:  08/25/22  142 lb 3.2 oz (64.5 kg)  08/15/22 141 lb 9.6 oz (64.2 kg)  08/11/22 139 lb (63 kg)     BP Readings from Last 3 Encounters:  08/25/22 125/78  08/15/22 108/60  08/11/22 (!) 130/57  Physical Exam- Limited  Constitutional:  Body mass index is 22.95 kg/m. , not in acute distress, normal state of mind Eyes:  EOMI, no exophthalmos Musculoskeletal: no gross deformities, strength intact in all four extremities, no gross restriction of joint movements Skin:  no rashes, no hyperemia, multiple abrasions from recent falls (multiple) Neurological: no tremor with outstretched hands  Diabetic Foot Exam - Simple   No data filed     CMP ( most recent) CMP     Component Value Date/Time   NA 131 (L) 08/15/2022 1447   K 4.4 08/15/2022 1447   CL 90 (L) 08/15/2022 1447   CO2 22 08/15/2022 1447   GLUCOSE 348 (H) 08/15/2022 1447   GLUCOSE 202 (H) 04/25/2022 0958   BUN 39 (H) 08/15/2022 1447   CREATININE 1.47 (H) 08/15/2022 1447   CALCIUM 9.5 08/15/2022 1447   PROT 7.3 08/11/2022 1516   ALBUMIN 4.3 08/11/2022 1516   AST 20 08/11/2022 1516   ALT 23 08/11/2022 1516   ALKPHOS 160 (H) 08/11/2022 1516   BILITOT 0.3 08/11/2022 1516   GFRNONAA >60 04/25/2022 0958   GFRAA 102 05/24/2020 1238     Diabetic Labs (most recent): Lab Results  Component Value Date   HGBA1C 8.5 (H) 06/20/2022   HGBA1C 7.8 (H) 01/28/2022   HGBA1C 5.4 08/25/2021   MICROALBUR 30 08/14/2021   MICROALBUR 30 09/24/2020     Lipid Panel ( most recent) Lipid Panel     Component Value Date/Time   CHOL 126 08/21/2020 0530   CHOL 136 04/25/2019 1608   TRIG 89 08/21/2020 0530   HDL 28 (L) 08/21/2020 0530   HDL 28 (L) 04/25/2019 1608   CHOLHDL 4.5 08/21/2020 0530   VLDL 18 08/21/2020 0530   LDLCALC 80 08/21/2020 0530   LDLCALC 78 04/25/2019 1608   LABVLDL 30 04/25/2019 1608      Lab Results  Component Value Date   TSH 1.960 11/25/2021   TSH 2.510 08/23/2021   TSH 2.686 04/18/2021   TSH 3.430 01/22/2021    TSH 5.033 (H) 12/17/2020   TSH 3.410 10/26/2020   TSH 0.628 08/21/2020   TSH 1.542 08/13/2020   TSH 2.180 08/03/2020   TSH 0.117 (L) 06/02/2020   FREET4 1.14 01/22/2021   FREET4 1.96 (H) 06/03/2020   FREET4 1.03 02/20/2017           Assessment & Plan:   1) Type 2 diabetes mellitus with diabetic polyneuropathy, without long-term current use of insulin (HCC)  - John [name] has currently uncontrolled symptomatic type 2 DM since 77 years of age.  He presents today with his meter and logs showing gross hyperglycemia overall.  His PCP recently checked his A1c on 3/15 which was 8.5%, increasing from last visit of 7.8%.  She started him on both Farxiga 5 mg po daily (has history of not tolerating Jardiance well) and Tresiba 12 units SQ nightly.  He denies any hypoglycemia but has been complaining of dizziness with position changes and has fallen multiple times.  -Recent labs reviewed.  - I had a long discussion with him about the progressive nature of diabetes and the pathology behind its complications. -his diabetes is complicated by CKD stage 2, PAD, and neuropathy and he remains at a high risk for more acute and chronic complications which include CAD, CVA, and retinopathy. These are all discussed in detail with him.  - Nutritional counseling repeated at each appointment due to patients tendency to fall back in to old habits.  -  The patient admits there is a room for improvement in their diet and drink choices. -  Suggestion is made for the patient to avoid simple carbohydrates from their diet including Cakes, Sweet Desserts / Pastries, Ice Cream, Soda (diet and regular), Sweet Tea, Candies, Chips, Cookies, Sweet Pastries, Store Bought Juices, Alcohol in Excess of 1-2 drinks a day, Artificial Sweeteners, Coffee Creamer, and "Sugar-free" Products. This will help patient to have stable blood glucose profile and potentially avoid unintended weight gain.   - I encouraged the patient  to switch to unprocessed or minimally processed complex starch and increased protein intake (animal or plant source), fruits, and vegetables.   - Patient is advised to stick to a routine mealtimes to eat 3 meals a day and avoid unnecessary snacks (to snack only to correct hypoglycemia).  - he will be scheduled with Norm Salt, RDN, CDE for diabetes education.  - I have approached him with the following individualized plan to manage  his diabetes and patient agrees:   -He is advised to discontinue his Rybelsus (due to GI issues-constipation) and his Marcelline Deist (perhaps causing dehydration due to being on multiple diuretics and sensitivity to SGLT2i).  He is to increase his [place] to 15 units SQ nightly.  Will bring him back in a few weeks to make necessary adjustments.  -he is encouraged to start monitoring blood glucose twice daily, before breakfast and before bed and to call the clinic if he has readings less than 70 or greater than 200 for 3 tests in a row.  Now that he is being treated with insulin, he is an excellent candidate for CGM.  I sent in for Dexcom G7 to [employer] for fulfillment.   - Specific targets for  A1c;  LDL, HDL,  and Triglycerides were discussed with the patient.  2) Blood Pressure /Hypertension:  his blood pressure is controlled to target.   he is advised to continue his current medications including Metoprolol 12.5 mg po daily and Torsemide 20 mg po daily, Spironolactone 25 mg po daily.  3) Lipids/Hyperlipidemia:    Review of his recent lipid panel from 08/21/20 showed controlled  LDL at 80.  he  is advised to continue Pravachol 40 mg daily at bedtime.  Side effects and precautions discussed with him.  4)  Weight/Diet:  his Body mass index is 22.95 kg/m.  -  he is is not a candidate for major weight loss. I discussed with him the fact that loss of 5 - 10% of his  current body weight will have the most impact on his diabetes management.  Exercise, and detailed  carbohydrates information provided  -  detailed on discharge instructions.  5) Hypothyroidism: There are no recent TFTs to review.  He is advised to continue Levothyroxine 50 mcg po daily before breakfast.  Will recheck on subsequent visits.   - The correct intake of thyroid hormone (Levothyroxine, Synthroid), is on empty stomach first thing in the morning, with water, separated by at least 30 minutes from breakfast and other medications,  and separated by more than 4 hours from calcium, iron, multivitamins, acid reflux medications (PPIs).  - This medication is a life-long medication and will be needed to correct thyroid hormone imbalances for the rest of your life.  The dose may change from time to time, based on thyroid blood work.  - It is extremely important to be consistent taking this medication, near the same time each morning.  -AVOID TAKING PRODUCTS CONTAINING BIOTIN (commonly found in  Hair, Skin, Nails vitamins) AS IT INTERFERES WITH THE VALIDITY OF THYROID FUNCTION BLOOD TESTS.  6) Chronic Care/Health Maintenance: -he is on ACEI/ARB and Statin medications and is encouraged to initiate and continue to follow up with Ophthalmology, Dentist, Podiatrist at least yearly or according to recommendations, and advised to QUIT SMOKING. I have recommended yearly flu vaccine and pneumonia vaccine at least every 5 years; moderate intensity exercise for up to 150 minutes weekly; and sleep for at least 7 hours a day.  - he is advised to maintain close follow up with Junie Spencer, FNP for primary care needs, as well as his other providers for optimal and coordinated care.      I spent  42  minutes in the care of the patient today including review of labs from CMP, Lipids, Thyroid Function, Hematology (current and previous including abstractions from other facilities); face-to-face time discussing  his blood glucose readings/logs, discussing hypoglycemia and hyperglycemia episodes and symptoms,  medications doses, his options of short and long term treatment based on the latest standards of care / guidelines;  discussion about incorporating lifestyle medicine;  and documenting the encounter. Risk reduction counseling performed per USPSTF guidelines to reduce obesity and cardiovascular risk factors.     Please refer to Patient Instructions for Blood Glucose Monitoring and Insulin/Medications Dosing Guide"  in media tab for additional information. Please  also refer to " Patient Self Inventory" in the Media  tab for reviewed elements of pertinent patient history.  [name] participated in the discussions, expressed understanding, and voiced agreement with the above plans.  All questions were answered to his satisfaction. he is encouraged to contact clinic should he have any questions or concerns prior to his return visit.   Follow up plan: - Return in about 4 weeks (around 09/22/2022) for Diabetes F/U, Bring meter and logs, No previsit labs.  Ronny Bacon, [facility] [address] [place], [place] [address] Phone: [phone] Fax: [phone]  08/25/2022, 2:57 PM

## 2022-08-26 NOTE — Telephone Encounter (Signed)
Third attempt to contact pt without a return call, will close encounter.

## 2022-09-02 ENCOUNTER — Other Ambulatory Visit: Payer: Self-pay | Admitting: Family

## 2022-09-02 DIAGNOSIS — E1142 Type 2 diabetes mellitus with diabetic polyneuropathy: Secondary | ICD-10-CM

## 2022-09-03 ENCOUNTER — Telehealth: Payer: Self-pay | Admitting: *Deleted

## 2022-09-03 NOTE — Telephone Encounter (Signed)
Patient left a message that his blood sugars are running high.  Returned call to the patient.. Since the patient's office visit on Aug 25 2022 the patient states that his blood sugars have been running higher. At the time of his office visit the Rybelsus and the Marcelline Deist was discontinued, the Rybelsus because of the Constipation and the patient states that he is still constipated, and the [place]  because it may have been causing dehydration due to multiple diuretics and sensitivity to SGLT 2.  Pt called with high BG readings.   Date Before breakfast Before lunch Before supper Bedtime  08/31/22 181   188  09/01/22 197   235  09/02/22 163   291  09/03/22 270       Pt taking: Tresbia 15 units at night. Patient states that he does not eat breakfast and he eats a last meal around 8:30 pm and takes his [place].He rotates his injection sites, denies seeing any insulin leak from the injection site. Not taking antibiotic or prednisone  He states that he has received his Dexcom after he called company and checked on it. There are more readings , from 05/20-05/25 if you want to see them.

## 2022-09-04 ENCOUNTER — Ambulatory Visit (INDEPENDENT_AMBULATORY_CARE_PROVIDER_SITE_OTHER): Payer: Medicare Other

## 2022-09-04 DIAGNOSIS — I4892 Unspecified atrial flutter: Secondary | ICD-10-CM | POA: Diagnosis not present

## 2022-09-04 LAB — CUP PACEART REMOTE DEVICE CHECK
Date Time Interrogation Session: 20240530004900
Implantable Pulse Generator Implant Date: 20240126
Pulse Gen Serial Number: 104324

## 2022-09-04 NOTE — Progress Notes (Signed)
[employer] 

## 2022-09-04 NOTE — Telephone Encounter (Signed)
Patient was called and made aware of Whitney's recommendation. 

## 2022-09-04 NOTE — Telephone Encounter (Signed)
Have him increase his Tresiba to 25 units SQ nightly.  That should help bring morning readings down and in turn help evening readings too.

## 2022-09-07 ENCOUNTER — Other Ambulatory Visit: Payer: Self-pay | Admitting: Internal Medicine

## 2022-09-07 DIAGNOSIS — J449 Chronic obstructive pulmonary disease, unspecified: Secondary | ICD-10-CM

## 2022-09-08 ENCOUNTER — Encounter: Payer: Self-pay | Admitting: Family Medicine

## 2022-09-08 ENCOUNTER — Ambulatory Visit (INDEPENDENT_AMBULATORY_CARE_PROVIDER_SITE_OTHER): Payer: Medicare Other | Admitting: Family Medicine

## 2022-09-08 VITALS — BP 136/62 | HR 110 | Ht 66.0 in | Wt 146.0 lb

## 2022-09-08 DIAGNOSIS — M109 Gout, unspecified: Secondary | ICD-10-CM

## 2022-09-08 MED ORDER — METHYLPREDNISOLONE ACETATE 40 MG/ML IJ SUSP
80.0000 mg | Freq: Once | INTRAMUSCULAR | Status: DC
Start: 2022-09-08 — End: 2022-09-08
  Administered 2022-09-08: 80 mg via INTRAMUSCULAR

## 2022-09-08 MED ORDER — METHYLPREDNISOLONE ACETATE 40 MG/ML IJ SUSP
40.0000 mg | Freq: Once | INTRAMUSCULAR | Status: DC
Start: 2022-09-08 — End: 2022-09-15

## 2022-09-08 NOTE — Addendum Note (Signed)
Addended by: Dorene Sorrow on: 09/08/2022 12:34 PM   Modules accepted: Orders

## 2022-09-08 NOTE — Patient Instructions (Signed)
Low-Purine Eating Plan A low-purine eating plan involves making food choices to limit your purine intake. Purine is a kind of uric acid. Too much uric acid in your blood can cause certain conditions, such as gout and kidney stones. Eating a low-purine diet may help control these conditions. What are tips for following this plan? Shopping Avoid buying products that contain high-fructose corn syrup. Check for this on food labels. It is commonly found in many processed foods and soft drinks. Be sure to check for it in baked goods such as cookies, canned fruits, and cereals and cereal bars. Avoid buying veal, chicken breast with skin, lamb, and organ meats such as liver. These types of meats tend to have the highest purine content. Choose dairy products. These may lower uric acid levels. Avoid certain types of fish. Not all fish and seafood have high purine content. Examples with high purine content include anchovies, trout, tuna, sardines, and salmon. Avoid buying beverages that contain alcohol, particularly beer and hard liquor. Alcohol can affect the way your body gets rid of uric acid. Meal planning  Learn which foods do or do not affect you. If you find out that a food tends to cause your gout symptoms to flare up, avoid eating that food. You can enjoy foods that do not cause problems. If you have any questions about a food item, talk with your dietitian or health care provider. Reduce the overall amount of meat in your diet. When you do eat meat, choose ones with lower purine content. Include plenty of fruits and vegetables. Although some vegetables may have a high purine content--such as asparagus, mushrooms, spinach, or cauliflower--it has been shown that these do not contribute to uric acid blood levels as much. Consume at least 1 dairy serving a day. This has been shown to decrease uric acid levels. General information If you drink alcohol: Limit how much you have to: 0-1 drink a day for  women who are not pregnant. 0-2 drinks a day for men. Know how much alcohol is in a drink. In the [place] one drink equals one 12 oz bottle of beer (355 mL), one 5 oz glass of wine (148 mL), or one 1 oz glass of hard liquor (44 mL). Drink plenty of water. Try to drink enough to keep your urine pale yellow. Fluids can help remove uric acid from your body. Work with your health care provider and dietitian to develop a plan to achieve or maintain a healthy weight. Losing weight may help reduce uric acid in your blood. What foods are recommended? The following are some types of foods that are good choices when limiting purine intake: Fresh or frozen fruits and vegetables. Whole grains, breads, cereals, and pasta. Rice. Beans, peas, legumes. Nuts and seeds. Dairy products. Fats and oils. The items listed above may not be a complete list. Talk with a dietitian about what dietary choices are best for you. What foods are not recommended? Limit your intake of foods high in purines, including: Beer and other alcohol. Meat-based gravy or sauce. Canned or fresh fish, such as: Anchovies, sardines, herring, salmon, and tuna. Mussels and scallops. Codfish, trout, and haddock. Bacon, veal, chicken breast with skin, and lamb. Organ meats, such as: Liver or kidney. Tripe. Sweetbreads (thymus gland or pancreas). Wild Education officer, environmental. Yeast or yeast extract supplements. Drinks sweetened with high-fructose corn syrup, such as soda. Processed foods made with high-fructose corn syrup. The items listed above may not be a complete list of foods  and beverages you should limit. Contact a dietitian for more information. Summary Eating a low-purine diet may help control conditions caused by too much uric acid in the body, such as gout or kidney stones. Choose low-purine foods, limit alcohol, and limit high-fructose corn syrup. You will learn over time which foods do or do not affect you. If you find out that a  food tends to cause your gout symptoms to flare up, avoid eating that food. This information is not intended to replace advice given to you by your health care provider. Make sure you discuss any questions you have with your health care provider. Document Revised: 03/07/2021 Document Reviewed: 03/07/2021 Elsevier Patient Education  2024 [employer].

## 2022-09-08 NOTE — Progress Notes (Signed)
BP 136/62   Pulse (!) 110   Ht 5\' 6"  (1.676 m)   Wt 146 lb (66.2 kg)   SpO2 96%   BMI 23.57 kg/m    Subjective:   Patient ID: John [name], male    DOB: 03-17-1946, 77 y.o.   MRN: [id number]  HPI: John [name] is a 77 y.o. male presenting on 09/08/2022 for Gout (Right elbow and right knee)   HPI Gout Patient is coming in complaining of right knee swelling and inflammation and right elbow swelling and inflammation that is usually where he gets gout and he says it has been quite severe to where he feels like he cannot walk currently.  He says it is swollen and red in his knee especially but some on his elbow as well.  He does take allopurinol but has not used anything during this gout flare because he does not have anything that he can use during a gout flare.  He says has been going on for the past few days and he was not able to get in anywhere over the weekend.  Relevant past medical, surgical, family and social history reviewed and updated as indicated. Interim medical history since our last visit reviewed. Allergies and medications reviewed and updated.  Review of Systems  Musculoskeletal:  Positive for arthralgias, gait problem and joint swelling.  Skin:  Positive for color change.    Per HPI unless specifically indicated above   Allergies as of 09/08/2022       Reactions   Sulfa Antibiotics Swelling   Mouth swelling   Atorvastatin Other (See Comments)   Muscle aches - tolerating Pravastatin 40 mg MWF   Jardiance [empagliflozin] Other (See Comments)   FEELS SLUGGISH, TIRED   Lopressor [metoprolol] Other (See Comments)   Fatigue   Rosuvastatin Other (See Comments)   Muscle aches - tolerating Pravastatin 40 mg MWF   Doxycycline Nausea Only   Temazepam Other (See Comments)   Made insomnia worse         Medication List        Accurate as of September 08, 2022 12:23 PM. If you have any questions, ask your nurse or doctor.          allopurinol 100 MG  tablet Commonly known as: ZYLOPRIM Take 1 tablet (100 mg total) by mouth daily.   aspirin EC 81 MG tablet Take 1 tablet (81 mg total) by mouth daily. Swallow whole.   cetirizine 10 MG tablet Commonly known as: ZyrTEC Allergy Take 1 tablet (10 mg total) by mouth daily.   dutasteride 0.5 MG capsule Commonly known as: AVODART Take 1 capsule (0.5 mg total) by mouth daily.   ezetimibe 10 MG tablet Commonly known as: ZETIA TAKE 1 TABLET BY MOUTH EVERY DAY   famotidine 20 MG tablet Commonly known as: PEPCID TAKE 1 TABLET BY MOUTH DAILY AFTER SUPPER   fluticasone 50 MCG/ACT nasal spray Commonly known as: FLONASE PLACE 1 SPRAY INTO BOTH NOSTRILS DAILY AS NEEDED FOR ALLERGIES OR RHINITIS.   FreeStyle Libre 3 Reader Devi 1 Device by Does not apply route every 14 (fourteen) days.   FreeStyle Libre 3 Sensor Misc 1 Device by Does not apply route continuous.   gabapentin 300 MG capsule Commonly known as: NEURONTIN TAKE 1 CAPSULE BY MOUTH THREE TIMES A DAY   Klor-Con M20 20 MEQ tablet Generic drug: potassium chloride SA TAKE 1 TABLET BY MOUTH EVERY DAY   levothyroxine 50 MCG tablet Commonly known as: SYNTHROID  TAKE 1 TABLET BY MOUTH EVERY DAY   linaclotide 72 MCG capsule Commonly known as: Linzess Take 1 capsule (72 mcg total) by mouth daily as needed (constipation).   magic mouthwash w/lidocaine Soln Take 10 mLs by mouth 3 (three) times daily as needed for mouth pain.   metolazone 2.5 MG tablet Commonly known as: ZAROXOLYN TAKE 1 TABLET BY MOUTH ON MONDAY, WEDNESDAY AND FRIDAY   metoprolol succinate 25 MG 24 hr tablet Commonly known as: Toprol XL Take 0.5 tablets (12.5 mg total) by mouth daily.   NON FORMULARY CPAP at bedtime   nystatin 100000 UNIT/ML suspension Commonly known as: MYCOSTATIN Take 5 mLs (500,000 Units total) by mouth 4 (four) times daily.   ondansetron 4 MG tablet Commonly known as: Zofran Take 1 tablet (4 mg total) by mouth every 8 (eight) hours  as needed for nausea or vomiting.   OneTouch Delica Lancets 30G Misc Use to test blood sugars daily as directed. DX: E11.9   OneTouch Verio Reflect w/Device Kit Use to test blood sugars daily as directed. DX: E11.9   OneTouch Verio test strip Generic drug: glucose blood USE TO TEST BLOOD SUGARS DAILY AS DIRECTED. DX: E11.9   OXYGEN Inhale 2 L into the lungs daily as needed (with Cpap at night).   pantoprazole 20 MG tablet Commonly known as: Protonix Take 1 tablet (20 mg total) by mouth daily.   polyethylene glycol powder 17 GM/SCOOP powder Commonly known as: GLYCOLAX/MIRALAX Take 17 g by mouth 2 (two) times daily as needed.   pravastatin 40 MG tablet Commonly known as: PRAVACHOL TAKE 1 TABLET (40 MG TOTAL) BY MOUTH ON MONDAYS , WEDNESDAYS, AND FRIDAYS AS DIRECTED   spironolactone 25 MG tablet Commonly known as: ALDACTONE Take 1 tablet (25 mg total) by mouth daily.   torsemide 20 MG tablet Commonly known as: DEMADEX Take 1 tablet (20 mg total) by mouth daily.   Trelegy Ellipta 100-62.5-25 MCG/ACT Aepb Generic drug: Fluticasone-Umeclidin-Vilant Inhale 1 puff into the lungs daily.   Evaristo Bury FlexTouch 200 UNIT/ML FlexTouch Pen Generic drug: insulin degludec Inject 10 Units into the skin every evening. What changed: how much to take   zolpidem 5 MG tablet Commonly known as: AMBIEN Take 1 tablet (5 mg total) by mouth at bedtime as needed for sleep.         Objective:   BP 136/62   Pulse (!) 110   Ht 5\' 6"  (1.676 m)   Wt 146 lb (66.2 kg)   SpO2 96%   BMI 23.57 kg/m   Wt Readings from Last 3 Encounters:  09/08/22 146 lb (66.2 kg)  08/25/22 142 lb 3.2 oz (64.5 kg)  08/15/22 141 lb 9.6 oz (64.2 kg)    Physical Exam Vitals and nursing note reviewed.  Constitutional:      General: He is not in acute distress.    Appearance: He is well-developed. He is not diaphoretic.  Eyes:     General: No scleral icterus.    Conjunctiva/sclera: Conjunctivae normal.   Neck:     Thyroid: No thyromegaly.  Musculoskeletal:        General: Swelling present. Normal range of motion.     Right elbow: Swelling present. Normal range of motion. Tenderness present.     Cervical back: Neck supple.     Right knee: Swelling and erythema present. Tenderness present.  Lymphadenopathy:     Cervical: No cervical adenopathy.  Skin:    General: Skin is warm and dry.  Findings: Erythema present. No rash.  Neurological:     Mental Status: He is alert and oriented to person, place, and time.     Coordination: Coordination normal.  Psychiatric:        Behavior: Behavior normal.       Assessment & Plan:   Problem List Items Addressed This Visit   None Visit Diagnoses     Acute gout of right knee, unspecified cause    -  Primary   Relevant Medications   methylPREDNISolone acetate (DEPO-MEDROL) injection 80 mg (Start on 09/08/2022 12:30 PM)       Will give Depo 80, told him to watch for his blood sugars that should come down the flare In the future he may want to talk to his provider about increasing allopurinol or starting on Uloric if possible Follow up plan: Return if symptoms worsen or fail to improve.  Counseling provided for all of the vaccine components No orders of the defined types were placed in this encounter.   Arville Care, MD [facility] 09/08/2022, 12:23 PM

## 2022-09-09 NOTE — Telephone Encounter (Signed)
Pt has a visit on 6/10 with [facility].

## 2022-09-15 ENCOUNTER — Ambulatory Visit (INDEPENDENT_AMBULATORY_CARE_PROVIDER_SITE_OTHER): Payer: Medicare Other

## 2022-09-15 ENCOUNTER — Encounter: Payer: Self-pay | Admitting: Family

## 2022-09-15 ENCOUNTER — Ambulatory Visit (INDEPENDENT_AMBULATORY_CARE_PROVIDER_SITE_OTHER): Payer: Medicare Other | Admitting: Family

## 2022-09-15 VITALS — BP 134/76 | HR 79 | Temp 97.4°F | Ht 66.0 in | Wt 144.0 lb

## 2022-09-15 DIAGNOSIS — R531 Weakness: Secondary | ICD-10-CM

## 2022-09-15 DIAGNOSIS — R296 Repeated falls: Secondary | ICD-10-CM

## 2022-09-15 DIAGNOSIS — I152 Hypertension secondary to endocrine disorders: Secondary | ICD-10-CM | POA: Diagnosis not present

## 2022-09-15 DIAGNOSIS — K5900 Constipation, unspecified: Secondary | ICD-10-CM | POA: Diagnosis not present

## 2022-09-15 DIAGNOSIS — F5101 Primary insomnia: Secondary | ICD-10-CM | POA: Diagnosis not present

## 2022-09-15 DIAGNOSIS — N138 Other obstructive and reflux uropathy: Secondary | ICD-10-CM | POA: Diagnosis not present

## 2022-09-15 DIAGNOSIS — I5033 Acute on chronic diastolic (congestive) heart failure: Secondary | ICD-10-CM

## 2022-09-15 DIAGNOSIS — I110 Hypertensive heart disease with heart failure: Secondary | ICD-10-CM | POA: Diagnosis not present

## 2022-09-15 DIAGNOSIS — E1159 Type 2 diabetes mellitus with other circulatory complications: Secondary | ICD-10-CM

## 2022-09-15 DIAGNOSIS — E039 Hypothyroidism, unspecified: Secondary | ICD-10-CM

## 2022-09-15 DIAGNOSIS — J449 Chronic obstructive pulmonary disease, unspecified: Secondary | ICD-10-CM

## 2022-09-15 DIAGNOSIS — K219 Gastro-esophageal reflux disease without esophagitis: Secondary | ICD-10-CM | POA: Diagnosis not present

## 2022-09-15 DIAGNOSIS — N401 Enlarged prostate with lower urinary tract symptoms: Secondary | ICD-10-CM | POA: Diagnosis not present

## 2022-09-15 DIAGNOSIS — C3491 Malignant neoplasm of unspecified part of right bronchus or lung: Secondary | ICD-10-CM | POA: Diagnosis not present

## 2022-09-15 DIAGNOSIS — M25532 Pain in left wrist: Secondary | ICD-10-CM

## 2022-09-15 DIAGNOSIS — E1142 Type 2 diabetes mellitus with diabetic polyneuropathy: Secondary | ICD-10-CM | POA: Diagnosis not present

## 2022-09-15 DIAGNOSIS — E782 Mixed hyperlipidemia: Secondary | ICD-10-CM | POA: Diagnosis not present

## 2022-09-15 DIAGNOSIS — E1169 Type 2 diabetes mellitus with other specified complication: Secondary | ICD-10-CM

## 2022-09-15 LAB — CBC WITH DIFFERENTIAL/PLATELET
Basophils Absolute: 0.1 10*3/uL (ref 0.0–0.2)
Basos: 1 %
EOS (ABSOLUTE): 0.1 10*3/uL (ref 0.0–0.4)
Eos: 1 %
Hematocrit: 41.9 % (ref 37.5–51.0)
Hemoglobin: 14.1 g/dL (ref 13.0–17.7)
Immature Grans (Abs): 0 10*3/uL (ref 0.0–0.1)
Immature Granulocytes: 0 %
Lymphocytes Absolute: 0.6 10*3/uL — ABNORMAL LOW (ref 0.7–3.1)
Lymphs: 4 %
MCH: 32 pg (ref 26.6–33.0)
MCHC: 33.7 g/dL (ref 31.5–35.7)
MCV: 95 fL (ref 79–97)
Monocytes Absolute: 1.1 10*3/uL — ABNORMAL HIGH (ref 0.1–0.9)
Monocytes: 8 %
Neutrophils Absolute: 12.3 10*3/uL — ABNORMAL HIGH (ref 1.4–7.0)
Neutrophils: 86 %
Platelets: 450 10*3/uL (ref 150–450)
RBC: 4.4 x10E6/uL (ref 4.14–5.80)
RDW: 13.7 % (ref 11.6–15.4)
WBC: 14.2 10*3/uL — ABNORMAL HIGH (ref 3.4–10.8)

## 2022-09-15 LAB — BAYER DCA HB A1C WAIVED: HB A1C (BAYER DCA - WAIVED): 8.2 % — ABNORMAL HIGH (ref 4.8–5.6)

## 2022-09-15 LAB — CMP14+EGFR
ALT: 11 IU/L (ref 0–44)
AST: 14 IU/L (ref 0–40)
Albumin/Globulin Ratio: 1.3
Albumin: 3.7 g/dL — ABNORMAL LOW (ref 3.8–4.8)
Alkaline Phosphatase: 129 IU/L — ABNORMAL HIGH (ref 44–121)
BUN/Creatinine Ratio: 17 (ref 10–24)
BUN: 18 mg/dL (ref 8–27)
Bilirubin Total: 0.3 mg/dL (ref 0.0–1.2)
CO2: 25 mmol/L (ref 20–29)
Calcium: 9.5 mg/dL (ref 8.6–10.2)
Chloride: 97 mmol/L (ref 96–106)
Creatinine, Ser: 1.06 mg/dL (ref 0.76–1.27)
Globulin, Total: 2.9 g/dL (ref 1.5–4.5)
Glucose: 93 mg/dL (ref 70–99)
Potassium: 4.5 mmol/L (ref 3.5–5.2)
Sodium: 136 mmol/L (ref 134–144)
Total Protein: 6.6 g/dL (ref 6.0–8.5)
eGFR: 73 mL/min/{1.73_m2} (ref >59)

## 2022-09-15 MED ORDER — ZOLPIDEM TARTRATE 5 MG PO TABS
5.0000 mg | ORAL_TABLET | Freq: Every evening | ORAL | 2 refills | Status: AC | PRN
Start: 2022-09-15 — End: ?

## 2022-09-15 MED ORDER — TRAMADOL HCL 50 MG PO TABS
50.0000 mg | ORAL_TABLET | Freq: Three times a day (TID) | ORAL | 0 refills | Status: AC | PRN
Start: 2022-09-15 — End: 2022-09-22

## 2022-09-15 NOTE — Patient Instructions (Signed)
Fall Prevention in the Home, Adult Falls can cause injuries and affect people of all ages. There are many simple things that you can do to make your home safe and to help prevent falls. If you need it, ask for help making these changes. What actions can I take to prevent falls? General information Use good lighting in all rooms. Make sure to: Replace any light bulbs that burn out. Turn on lights if it is dark and use night-lights. Keep items that you use often in easy-to-reach places. Lower the shelves around your home if needed. Move furniture so that there are clear paths around it. Do not keep throw rugs or other things on the floor that can make you trip. If any of your floors are uneven, fix them. Add color or contrast paint or tape to clearly mark and help you see: Grab bars or handrails. First and last steps of staircases. Where the edge of each step is. If you use a ladder or stepladder: Make sure that it is fully opened. Do not climb a closed ladder. Make sure the sides of the ladder are locked in place. Have someone hold the ladder while you use it. Know where your pets are as you move through your home. What can I do in the bathroom?     Keep the floor dry. Clean up any water that is on the floor right away. Remove soap buildup in the bathtub or shower. Buildup makes bathtubs and showers slippery. Use non-skid mats or decals on the floor of the bathtub or shower. Attach bath mats securely with double-sided, non-slip rug tape. If you need to sit down while you are in the shower, use a non-slip stool. Install grab bars by the toilet and in the bathtub and shower. Do not use towel bars as grab bars. What can I do in the bedroom? Make sure that you have a light by your bed that is easy to reach. Do not use any sheets or blankets on your bed that hang to the floor. Have a firm bench or chair with side arms that you can use for support when you get dressed. What can I do in  the kitchen? Clean up any spills right away. If you need to reach something above you, use a sturdy step stool that has a grab bar. Keep electrical cables out of the way. Do not use floor polish or wax that makes floors slippery. What can I do with my stairs? Do not leave anything on the stairs. Make sure that you have a light switch at the top and the bottom of the stairs. Have them installed if you do not have them. Make sure that there are handrails on both sides of the stairs. Fix handrails that are broken or loose. Make sure that handrails are as long as the staircases. Install non-slip stair treads on all stairs in your home if they do not have carpet. Avoid having throw rugs at the top or bottom of stairs, or secure the rugs with carpet tape to prevent them from moving. Choose a carpet design that does not hide the edge of steps on the stairs. Make sure that carpet is firmly attached to the stairs. Fix any carpet that is loose or worn. What can I do on the outside of my home? Use bright outdoor lighting. Repair the edges of walkways and driveways and fix any cracks. Clear paths of anything that can make you trip, such as tools or rocks. Add   color or contrast paint or tape to clearly mark and help you see high doorway thresholds. Trim any bushes or trees on the main path into your home. Check that handrails are securely fastened and in good repair. Both sides of all steps should have handrails. Install guardrails along the edges of any raised decks or porches. Have leaves, snow, and ice cleared regularly. Use sand, salt, or ice melt on walkways during winter months if you live where there is ice and snow. In the garage, clean up any spills right away, including grease or oil spills. What other actions can I take? Review your medicines with your health care provider. Some medicines can make you confused or feel dizzy. This can increase your chance of falling. Wear closed-toe shoes that  fit well and support your feet. Wear shoes that have rubber soles and low heels. Use a cane, walker, scooter, or crutches that help you move around if needed. Talk with your provider about other ways that you can decrease your risk of falls. This may include seeing a physical therapist to learn to do exercises to improve movement and strength. Where to find more information Centers for Disease Control and Prevention, STEADI: TonerPromos.no [employer] on Aging: BaseRingTones.pl National Institute on Aging: BaseRingTones.pl Contact a health care provider if: You are afraid of falling at home. You feel weak, drowsy, or dizzy at home. You fall at home. Get help right away if you: Lose consciousness or have trouble moving after a fall. Have a fall that causes a head injury. These symptoms may be an emergency. Get help right away. Call 911. Do not wait to see if the symptoms will go away. Do not drive yourself to the hospital. This information is not intended to replace advice given to you by your health care provider. Make sure you discuss any questions you have with your health care provider. Document Revised: 11/25/2021 Document Reviewed: 11/25/2021 Elsevier Patient Education  2024 [employer].

## 2022-09-15 NOTE — Progress Notes (Signed)
Subjective:    Patient ID: John [name], male    DOB: 02/05/1946, 77 y.o.   MRN: [id number]  Chief Complaint  Patient presents with   Follow-up   Fall    Hurt right wrist and and knee    Pt presents to the office today for chronic care follow up. He is followed by Cardiologists  for CHF. Followed by Oncologists every 3 months for non-small lung cancer. He completed radiation.     He is followed by Pulmonologist every 6 months for COPD. He continues to smoke 1/2 and complaining of increased SOB.  He is followed by Vascular for PAD.    He is followed by Urologists for BPH and Prostate.   He has seen GI for bleed, but stable now.     Has OSA and using CPAP nightly.    Complaining of generalized weakness. Reports he has fallen 6-7 times in the last year. He just fell two days ago while walking outside and landed on his left knee and bilateral hand.  Fall The accident occurred 2 days ago. He landed on [place]crete. The volume of blood lost was minimal. The point of impact was the left wrist. The pain is present in the left knee. The pain is at a severity of 10/10. The pain is moderate. The symptoms are aggravated by sitting. He has tried NSAID and rest for the symptoms. The treatment provided mild relief.  Hypertension This is a chronic problem. The current episode started more than 1 year ago. The problem has been resolved since onset. The problem is controlled. Associated symptoms include blurred vision and malaise/fatigue. Pertinent negatives include no peripheral edema or shortness of breath. Risk factors for coronary artery disease include diabetes mellitus, dyslipidemia, male gender and sedentary lifestyle. The current treatment provides moderate improvement. Identifiable causes of hypertension include a thyroid problem.  Congestive Heart Failure Presents for follow-up visit. Associated symptoms include edema, fatigue and nocturia. Pertinent negatives include no shortness of breath. The  symptoms have been stable.  Gastroesophageal Reflux He complains of belching and heartburn. This is a chronic problem. The current episode started more than 1 year ago. The problem occurs occasionally. Associated symptoms include fatigue. He has tried a PPI for the symptoms. The treatment provided moderate relief.  Thyroid Problem Presents for follow-up visit. Symptoms include constipation and fatigue. Patient reports no anxiety. The symptoms have been stable.  Diabetes He presents for his follow-up diabetic visit. He has type 2 diabetes mellitus. Pertinent negatives for hypoglycemia include no nervousness/anxiousness. Associated symptoms include blurred vision, fatigue and foot paresthesias. Risk factors for coronary artery disease include dyslipidemia, diabetes mellitus, hypertension and sedentary lifestyle. He is following a generally healthy diet. His overall blood glucose range is 110-130 mg/dl.  Arthritis Presents for follow-up visit. He complains of pain and stiffness. Affected locations include the left knee, right knee, right MCP and left MCP. His pain is at a severity of 4/10. Associated symptoms include fatigue.  Benign Prostatic Hypertrophy This is a chronic problem. The current episode started more than 1 year ago. Irritative symptoms include nocturia.  Insomnia Primary symptoms: difficulty falling asleep, frequent awakening, malaise/fatigue.   The current episode started more than one year. The onset quality is gradual. The problem occurs intermittently. The symptoms are relieved by medication. Past treatments include meditation.  Nicotine Dependence Presents for follow-up visit. Symptoms include fatigue and insomnia. His urge triggers include company of smokers. The symptoms have been stable. He smokes < 1/2 a pack  of cigarettes per day.  Constipation This is a chronic problem. The current episode started more than 1 year ago. The problem has been waxing and waning since onset. His  stool frequency is 2 to 3 times per week. He has tried laxatives for the symptoms. The treatment provided moderate relief.      Review of Systems  Constitutional:  Positive for fatigue and malaise/fatigue.  Eyes:  Positive for blurred vision.  Respiratory:  Negative for shortness of breath.   Gastrointestinal:  Positive for constipation and heartburn.  Genitourinary:  Positive for nocturia.  Musculoskeletal:  Positive for arthritis and stiffness.  Psychiatric/Behavioral:  The patient has insomnia. The patient is not nervous/anxious.   All other systems reviewed and are negative.      Objective:   Physical Exam Vitals reviewed.  Constitutional:      General: He is not in acute distress.    Appearance: He is well-developed.  HENT:     Head: Normocephalic.     Right Ear: Tympanic membrane normal.     Left Ear: Tympanic membrane normal.  Eyes:     General:        Right eye: No discharge.        Left eye: No discharge.     Pupils: Pupils are equal, round, and reactive to light.  Neck:     Thyroid: No thyromegaly.  Cardiovascular:     Rate and Rhythm: Normal rate and regular rhythm.     Heart sounds: Normal heart sounds. No murmur heard. Pulmonary:     Effort: Pulmonary effort is normal. No respiratory distress.     Breath sounds: Normal breath sounds. No wheezing.  Abdominal:     General: Bowel sounds are normal. There is no distension.     Palpations: Abdomen is soft.     Tenderness: There is no abdominal tenderness.  Musculoskeletal:        General: Tenderness present.     Cervical back: Normal range of motion and neck supple.     Comments: Pain in left wrist with flexion and extension  Skin:    General: Skin is warm and dry.     Findings: No erythema or rash.  Neurological:     Mental Status: He is alert and oriented to person, place, and time.     Cranial Nerves: No cranial nerve deficit.     Motor: Weakness present.     Gait: Gait abnormal.     Deep Tendon  Reflexes: Reflexes are normal and symmetric.  Psychiatric:        Behavior: Behavior normal.        Thought Content: Thought content normal.        Judgment: Judgment normal.       BP 134/76   Pulse 79   Temp (!) 97.4 F (36.3 C) (Temporal)   Ht 5\' 6"  (1.676 m)   Wt 144 lb (65.3 kg)   SpO2 95%   BMI 23.24 kg/m      Assessment & Plan:  CRISTOS YAZZIE comes in today with chief complaint of Follow-up and Fall (Hurt right wrist and and knee )   Diagnosis and orders addressed:  1. Acute on chronic diastolic CHF (congestive heart failure) (HCC) - CMP14+EGFR - CBC with Differential/Platelet  2. BPH with urinary obstruction - CMP14+EGFR  3. Constipation, unspecified constipation type - CMP14+EGFR  4. COPD GOLD III/ group D still smoking  - CMP14+EGFR - Ambulatory referral to Home Health  5. Type  2 diabetes mellitus with diabetic polyneuropathy, without long-term current use of insulin (HCC) - CMP14+EGFR - Bayer DCA Hb A1c Waived - Ambulatory referral to Home Health  6. Diabetic polyneuropathy associated with type 2 diabetes mellitus (HCC) - CMP14+EGFR  7. GERD without esophagitis - CMP14+EGFR  8. Hypertension associated with diabetes (HCC) - CMP14+EGFR  9. Hypothyroidism, unspecified type - CMP14+EGFR  10. Mixed diabetic hyperlipidemia associated with type 2 diabetes mellitus (HCC) - CMP14+EGFR  11. Non-small cell lung cancer, right (HCC) - CMP14+EGFR  12. Primary insomnia - CMP14+EGFR - zolpidem (AMBIEN) 5 MG tablet; Take 1 tablet (5 mg total) by mouth at bedtime as needed for sleep.  Dispense: 30 tablet; Refill: 2  13. General weakness - CMP14+EGFR - Ambulatory referral to Home Health  14. Frequent falls - CMP14+EGFR - Ambulatory referral to Home Health  15. Left wrist pain - CMP14+EGFR - DG Wrist Complete Left - traMADol (ULTRAM) 50 MG tablet; Take 1 tablet (50 mg total) by mouth every 8 (eight) hours as needed for up to 7 days.  Dispense:  20 tablet; Refill: 0  Referral to Home health placed Fall preventions  Continue medications  Will give 7 days of Ultram to take as needed  Labs pending Health Maintenance reviewed Diet and exercise encouraged  Follow up plan: 2 months   Jannifer Rodney, FNP

## 2022-09-16 ENCOUNTER — Telehealth: Payer: Self-pay | Admitting: Family

## 2022-09-16 ENCOUNTER — Other Ambulatory Visit: Payer: Self-pay | Admitting: Family

## 2022-09-16 MED ORDER — ALLOPURINOL 300 MG PO TABS: 300.0000 mg | ORAL_TABLET | Freq: Every day | ORAL | 1 refills | Status: DC

## 2022-09-16 MED ORDER — COLCHICINE 0.6 MG PO TABS: ORAL_TABLET | ORAL | 0 refills | Status: DC

## 2022-09-16 NOTE — Telephone Encounter (Signed)
  Incoming Patient Call  09/16/2022  What symptoms do you have? Can not move his hand can not get around he did not sleep all night his hand is really swelled up  How long have you been sick? Since last night after taking tramadol  Have you been seen for this problem? Yes yesterday but thinks he may have gout   If your provider decides to give you a prescription, which pharmacy would you like for it to be sent to? [facility]   Patient informed that this information will be sent to the clinical staff for review and that they should receive a follow up call.

## 2022-09-16 NOTE — Telephone Encounter (Signed)
Patient called back and he thinks this wrist is a gout flare up and wants gout meds called in

## 2022-09-16 NOTE — Telephone Encounter (Signed)
See result note.  ° °Malayshia All, FNP ° °

## 2022-09-17 NOTE — Telephone Encounter (Signed)
Pt aware to take benadryl  If needed for itching.

## 2022-09-17 NOTE — Telephone Encounter (Signed)
Patient calling to discuss what over the counter medications he can take with his current meds. Please call back

## 2022-09-17 NOTE — Telephone Encounter (Signed)
Calling to check on the status of getting a medication called in. Please call back.

## 2022-09-17 NOTE — Telephone Encounter (Signed)
PATIENT AWARE ITS BEEN SENT

## 2022-09-19 ENCOUNTER — Inpatient Hospital Stay: Payer: Medicare Other | Attending: Hematology

## 2022-09-19 ENCOUNTER — Ambulatory Visit (HOSPITAL_COMMUNITY)
Admission: RE | Admit: 2022-09-19 | Discharge: 2022-09-19 | Disposition: A | Discharge status: Home / Self Care | Payer: Medicare Other | Source: Ambulatory Visit | Attending: Hematology | Admitting: Hematology

## 2022-09-19 VITALS — BP 139/68 | HR 102 | Temp 96.5°F | Resp 20

## 2022-09-19 DIAGNOSIS — C3491 Malignant neoplasm of unspecified part of right bronchus or lung: Secondary | ICD-10-CM

## 2022-09-19 DIAGNOSIS — R918 Other nonspecific abnormal finding of lung field: Secondary | ICD-10-CM | POA: Insufficient documentation

## 2022-09-19 DIAGNOSIS — J439 Emphysema, unspecified: Secondary | ICD-10-CM | POA: Diagnosis not present

## 2022-09-19 DIAGNOSIS — Z79899 Other long term (current) drug therapy: Secondary | ICD-10-CM

## 2022-09-19 DIAGNOSIS — D500 Iron deficiency anemia secondary to blood loss (chronic): Secondary | ICD-10-CM

## 2022-09-19 DIAGNOSIS — D539 Nutritional anemia, unspecified: Secondary | ICD-10-CM | POA: Insufficient documentation

## 2022-09-19 DIAGNOSIS — E039 Hypothyroidism, unspecified: Secondary | ICD-10-CM | POA: Insufficient documentation

## 2022-09-19 DIAGNOSIS — Z85118 Personal history of other malignant neoplasm of bronchus and lung: Secondary | ICD-10-CM | POA: Insufficient documentation

## 2022-09-19 DIAGNOSIS — C3490 Malignant neoplasm of unspecified part of unspecified bronchus or lung: Secondary | ICD-10-CM | POA: Diagnosis not present

## 2022-09-19 DIAGNOSIS — Z95828 Presence of other vascular implants and grafts: Secondary | ICD-10-CM

## 2022-09-19 LAB — COMPREHENSIVE METABOLIC PANEL
ALT: 15 U/L (ref 0–44)
AST: 19 U/L (ref 15–41)
Albumin: 3.1 g/dL — ABNORMAL LOW (ref 3.5–5.0)
Alkaline Phosphatase: 127 U/L — ABNORMAL HIGH (ref 38–126)
Anion gap: 10 (ref 5–15)
BUN: 30 mg/dL — ABNORMAL HIGH (ref 8–23)
CO2: 26 mmol/L (ref 22–32)
Calcium: 8.8 mg/dL — ABNORMAL LOW (ref 8.9–10.3)
Chloride: 96 mmol/L — ABNORMAL LOW (ref 98–111)
Creatinine, Ser: 1.08 mg/dL (ref 0.61–1.24)
GFR, Estimated: >60 mL/min (ref >60)
Glucose, Bld: 167 mg/dL — ABNORMAL HIGH (ref 70–99)
Potassium: 3.8 mmol/L (ref 3.5–5.1)
Sodium: 132 mmol/L — ABNORMAL LOW (ref 135–145)
Total Bilirubin: 0.3 mg/dL (ref 0.3–1.2)
Total Protein: 7.3 g/dL (ref 6.5–8.1)

## 2022-09-19 LAB — CBC WITH DIFFERENTIAL/PLATELET
Abs Immature Granulocytes: 0.05 10*3/uL (ref 0.00–0.07)
Basophils Absolute: 0.1 10*3/uL (ref 0.0–0.1)
Basophils Relative: 1 %
Eosinophils Absolute: 0.2 10*3/uL (ref 0.0–0.5)
Eosinophils Relative: 2 %
HCT: 38.9 % — ABNORMAL LOW (ref 39.0–52.0)
Hemoglobin: 13 g/dL (ref 13.0–17.0)
Immature Granulocytes: 1 %
Lymphocytes Relative: 5 %
Lymphs Abs: 0.5 10*3/uL — ABNORMAL LOW (ref 0.7–4.0)
MCH: 33 pg (ref 26.0–34.0)
MCHC: 33.4 g/dL (ref 30.0–36.0)
MCV: 98.7 fL (ref 80.0–100.0)
Monocytes Absolute: 1 10*3/uL (ref 0.1–1.0)
Monocytes Relative: 10 %
Neutro Abs: 8.1 10*3/uL — ABNORMAL HIGH (ref 1.7–7.7)
Neutrophils Relative %: 81 %
Platelets: 447 10*3/uL — ABNORMAL HIGH (ref 150–400)
RBC: 3.94 MIL/uL — ABNORMAL LOW (ref 4.22–5.81)
RDW: 14.6 % (ref 11.5–15.5)
WBC: 10 10*3/uL (ref 4.0–10.5)
nRBC: 0 % (ref 0.0–0.2)

## 2022-09-19 LAB — TSH: TSH: 2.178 u[IU]/mL (ref 0.350–4.500)

## 2022-09-19 MED ORDER — HEPARIN SOD (PORK) LOCK FLUSH 100 UNIT/ML IV SOLN
INTRAVENOUS | Status: AC
  Filled 2022-09-19: qty 5

## 2022-09-19 MED ORDER — SODIUM CHLORIDE 0.9% FLUSH
10.0000 mL | Freq: Once | INTRAVENOUS | Status: AC
  Administered 2022-09-19: 10 mL via INTRAVENOUS

## 2022-09-19 MED ORDER — IOHEXOL 300 MG/ML  SOLN
75.0000 mL | Freq: Once | INTRAMUSCULAR | Status: AC | PRN
  Administered 2022-09-19: 75 mL via INTRAVENOUS

## 2022-09-19 NOTE — Patient Instructions (Signed)
[facility]  Discharge Instructions: Thank you for choosing [facility] to provide your oncology and hematology care.  If you have a lab appointment with the Cancer Center - please note that after April 8th, 2024, all labs will be drawn in the cancer center.  You do not have to check in or register with the main entrance as you have in the past but will complete your check-in in the cancer center.  Wear comfortable clothing and clothing appropriate for easy access to any Portacath or PICC line.   We strive to give you quality time with your provider. You may need to reschedule your appointment if you arrive late (15 or more minutes).  Arriving late affects you and other patients whose appointments are after yours.  Also, if you miss three or more appointments without notifying the office, you may be dismissed from the clinic at the provider's discretion.      For prescription refill requests, have your pharmacy contact our office and allow 72 hours for refills to be completed.    Today you received the following Port flush with lab and access for CT scan.   To help prevent nausea and vomiting after your treatment, we encourage you to take your nausea medication as directed.  BELOW ARE SYMPTOMS THAT SHOULD BE REPORTED IMMEDIATELY: *FEVER GREATER THAN 100.4 F (38 C) OR HIGHER *CHILLS OR SWEATING *NAUSEA AND VOMITING THAT IS NOT CONTROLLED WITH YOUR NAUSEA MEDICATION *UNUSUAL SHORTNESS OF BREATH *UNUSUAL BRUISING OR BLEEDING *URINARY PROBLEMS (pain or burning when urinating, or frequent urination) *BOWEL PROBLEMS (unusual diarrhea, constipation, pain near the anus) TENDERNESS IN MOUTH AND THROAT WITH OR WITHOUT PRESENCE OF ULCERS (sore throat, sores in mouth, or a toothache) UNUSUAL RASH, SWELLING OR PAIN  UNUSUAL VAGINAL DISCHARGE OR ITCHING   Items with * indicate a potential emergency and should be followed up as soon as possible or go to the Emergency  Department if any problems should occur.  Please show the CHEMOTHERAPY ALERT CARD or IMMUNOTHERAPY ALERT CARD at check-in to the Emergency Department and triage nurse.  Should you have questions after your visit or need to cancel or reschedule your appointment, please contact [facility] [phone]  and follow the prompts.  Office hours are 8:00 a.m. to 4:30 p.m. Monday - Friday. Please note that voicemails left after 4:00 p.m. may not be returned until the following business day.  We are closed weekends and major holidays. You have access to a nurse at all times for urgent questions. Please call the main number to the clinic [phone] and follow the prompts.  For any non-urgent questions, you may also contact your provider using MyChart. We now offer e-Visits for anyone 59 and older to request care online for non-urgent symptoms. For details visit [email or web address].   Also download the MyChart app! Go to the app store, search "MyChart", open the app, select [place], and log in with your MyChart username and password.

## 2022-09-19 NOTE — Progress Notes (Signed)
Port flushed with good blood return noted. No bruising or swelling at site. Patient left accessed for CT scan and patient discharged in satisfactory condition. VVS stable with no signs or symptoms of distressed noted. 

## 2022-09-22 ENCOUNTER — Ambulatory Visit: Payer: Medicare Other | Admitting: Family Medicine

## 2022-09-22 ENCOUNTER — Other Ambulatory Visit: Payer: Medicare Other

## 2022-09-23 ENCOUNTER — Encounter: Payer: Self-pay | Admitting: Nurse Practitioner

## 2022-09-23 ENCOUNTER — Ambulatory Visit (INDEPENDENT_AMBULATORY_CARE_PROVIDER_SITE_OTHER): Payer: Medicare Other | Admitting: Nurse Practitioner

## 2022-09-23 VITALS — BP 121/75 | HR 99 | Ht 66.0 in | Wt 143.6 lb

## 2022-09-23 DIAGNOSIS — Z794 Long term (current) use of insulin: Secondary | ICD-10-CM | POA: Diagnosis not present

## 2022-09-23 DIAGNOSIS — E039 Hypothyroidism, unspecified: Secondary | ICD-10-CM | POA: Diagnosis not present

## 2022-09-23 DIAGNOSIS — I10 Essential (primary) hypertension: Secondary | ICD-10-CM | POA: Diagnosis not present

## 2022-09-23 DIAGNOSIS — E1142 Type 2 diabetes mellitus with diabetic polyneuropathy: Secondary | ICD-10-CM

## 2022-09-23 DIAGNOSIS — E785 Hyperlipidemia, unspecified: Secondary | ICD-10-CM

## 2022-09-23 LAB — POCT UA - MICROALBUMIN
Albumin/Creatinine Ratio, Urine, POC: >300
Creatinine, POC: 50 mg/dL
Microalbumin Ur, POC: 80 mg/L

## 2022-09-23 MED ORDER — TRESIBA FLEXTOUCH 200 UNIT/ML ~~LOC~~ SOPN
26.0000 [IU] | PEN_INJECTOR | Freq: Every evening | SUBCUTANEOUS | 3 refills | Status: DC
Start: 2022-09-23 — End: 2022-11-14

## 2022-09-23 NOTE — Progress Notes (Signed)
Endocrinology Follow Up Note       09/23/2022, 10:36 AM   Subjective:    Patient ID: John [name], male    DOB: 02-02-1946.  JOFFREY RAJAN is being seen in follow up after being seen in consultation for management of currently uncontrolled symptomatic diabetes requested by  Junie Spencer, FNP.   Past Medical History:  Diagnosis Date   Adenocarcinoma of lung, right (HCC) 04/18/2016   Anemia    Anxiety    Arthritis    Asthma    Atrial flutter (HCC)    BPH (benign prostatic hyperplasia)    with urinary retention 02/06/20   CHF (congestive heart failure) (HCC)    COPD (chronic obstructive pulmonary disease) (HCC)    Depression    Diabetes mellitus, type II (HCC)    Dyspnea    History of kidney stones    History of radiation therapy    right lung 09/10/2020-09/17/2020   Dr Roselind Messier   History of radiation therapy    Left and right Lung- 06/19/22-06/26/22   Hyperlipidemia    Hypertension    Hypothyroidism    Macular degeneration    Neuropathy    Non-small cell lung cancer, right (HCC) 04/18/2016   Peripheral vascular disease (HCC)    Prostatitis    Pulmonary nodule, left 07/16/2016   Sleep apnea    cpap    Past Surgical History:  Procedure Laterality Date   A-FLUTTER ABLATION N/A 01/13/2022   Procedure: A-FLUTTER ABLATION;  Surgeon: Lanier Prude, MD;  Location: [facility];  Service: Cardiovascular;  Laterality: N/A;   ABDOMINAL AORTOGRAM W/LOWER EXTREMITY Left 02/06/2020   Procedure: ABDOMINAL AORTOGRAM W/LOWER EXTREMITY;  Surgeon: Runell Gess, MD;  Location: [facility];  Service: Cardiovascular;  Laterality: Left;   ABDOMINAL AORTOGRAM W/LOWER EXTREMITY N/A 05/30/2021   Procedure: ABDOMINAL AORTOGRAM W/LOWER EXTREMITY;  Surgeon: Cephus Shelling, MD;  Location: [facility];  Service: Cardiovascular;  Laterality: N/A;   BIOPSY  09/16/2021   Procedure: BIOPSY;   Surgeon: Iva Boop, MD;  Location: [facility] ENDOSCOPY;  Service: Gastroenterology;;   CARDIOVERSION N/A 10/05/2020   Procedure: CARDIOVERSION;  Surgeon: Sande Rives, MD;  Location: [facility] ENDOSCOPY;  Service: Cardiovascular;  Laterality: N/A;   CATARACT EXTRACTION, BILATERAL Bilateral    COLONOSCOPY WITH PROPOFOL N/A 06/20/2021   Procedure: COLONOSCOPY WITH PROPOFOL;  Surgeon: Hilarie Fredrickson, MD;  Location: [facility] ENDOSCOPY;  Service: Endoscopy;  Laterality: N/A;   COLONOSCOPY WITH PROPOFOL N/A 09/16/2021   Procedure: COLONOSCOPY WITH PROPOFOL;  Surgeon: Iva Boop, MD;  Location: [facility] ENDOSCOPY;  Service: Gastroenterology;  Laterality: N/A;   ENDARTERECTOMY FEMORAL Right 08/20/2020   Procedure: ENDARTERECTOMY  RIGHT FEMORAL ARTERY;  Surgeon: Cephus Shelling, MD;  Location: [facility] OR;  Service: Vascular;  Laterality: Right;   ENTEROSCOPY N/A 10/11/2021   Procedure: ENTEROSCOPY;  Surgeon: Dolores Frame, MD;  Location: [facility];  Service: Gastroenterology;  Laterality: N/A;   ESOPHAGOGASTRODUODENOSCOPY N/A 09/16/2021   Procedure: ESOPHAGOGASTRODUODENOSCOPY (EGD);  Surgeon: Iva Boop, MD;  Location: [facility] ENDOSCOPY;  Service: Gastroenterology;  Laterality: N/A;   ESOPHAGOGASTRODUODENOSCOPY (EGD) WITH PROPOFOL N/A 09/06/2020   Procedure: ESOPHAGOGASTRODUODENOSCOPY (  EGD) WITH PROPOFOL;  Surgeon: Hilarie Fredrickson, MD;  Location: [facility] ENDOSCOPY;  Service: Endoscopy;  Laterality: N/A;   ESOPHAGOGASTRODUODENOSCOPY (EGD) WITH PROPOFOL N/A 10/11/2021   Procedure: ESOPHAGOGASTRODUODENOSCOPY (EGD) WITH PROPOFOL;  Surgeon: Dolores Frame, MD;  Location: [facility];  Service: Gastroenterology;  Laterality: N/A;   HEMOSTASIS CLIP PLACEMENT  09/16/2021   Procedure: HEMOSTASIS CLIP PLACEMENT;  Surgeon: Iva Boop, MD;  Location: [facility] ENDOSCOPY;  Service: Gastroenterology;;   HOT HEMOSTASIS N/A 09/16/2021   Procedure: HOT HEMOSTASIS (ARGON PLASMA COAGULATION/BICAP);  Surgeon: Iva Boop, MD;  Location: [facility] ENDOSCOPY;  Service: Gastroenterology;  Laterality: N/A;   HOT HEMOSTASIS  10/11/2021   Procedure: HOT HEMOSTASIS (ARGON PLASMA COAGULATION/BICAP);  Surgeon: Marguerita Merles, Reuel Boom, MD;  Location: [facility];  Service: Gastroenterology;;   INSERTION OF ILIAC STENT Right 08/20/2020   Procedure: RETROGRADE INSERTION OF RIGHT ILIAC STENT;  Surgeon: Cephus Shelling, MD;  Location: [facility] OR;  Service: Vascular;  Laterality: Right;   INTRAOPERATIVE ARTERIOGRAM Right 08/20/2020   Procedure: INTRA OPERATIVE ARTERIOGRAM ILIAC;  Surgeon: Cephus Shelling, MD;  Location: [facility] OR;  Service: Vascular;  Laterality: Right;   PATCH ANGIOPLASTY Right 08/20/2020   Procedure: PATCH ANGIOPLASTY RIGHT FEMORAL ARTERY;  Surgeon: Cephus Shelling, MD;  Location: [facility] OR;  Service: Vascular;  Laterality: Right;   PERIPHERAL VASCULAR BALLOON ANGIOPLASTY Right 05/30/2021   Procedure: PERIPHERAL VASCULAR BALLOON ANGIOPLASTY;  Surgeon: Cephus Shelling, MD;  [facility]: [facility];  Service: Cardiovascular;  Laterality: Right;   PORTACATH PLACEMENT Left 06/13/2016   Procedure: INSERTION PORT-A-CATH;  Surgeon: Franky Macho, MD;  Location: [facility] ORS;  Service: General;  Laterality: Left;   TRANSURETHRAL RESECTION OF PROSTATE N/A 05/31/2020   Procedure: TRANSURETHRAL RESECTION OF THE PROSTATE (TURP);  Surgeon: Malen Gauze, MD;  Location: [facility] ORS;  Service: Urology;  Laterality: N/A;   VIDEO BRONCHOSCOPY WITH ENDOBRONCHIAL NAVIGATION N/A 05/28/2016   Procedure: VIDEO BRONCHOSCOPY WITH ENDOBRONCHIAL NAVIGATION;  Surgeon: Loreli Slot, MD;  Location: [facility] OR;  Service: Thoracic;  Laterality: N/A;   VIDEO BRONCHOSCOPY WITH ENDOBRONCHIAL ULTRASOUND N/A 05/28/2016   Procedure: VIDEO BRONCHOSCOPY WITH ENDOBRONCHIAL ULTRASOUND;  Surgeon: Loreli Slot, MD;  Location: [facility] OR;  Service: Thoracic;  Laterality: N/A;    Social History   Socioeconomic History   Marital status: Human resources officer    Spouse name: Not on file   Number of children: 5   Years of education: 10   Highest education level: 10th grade  Occupational History   Occupation: Retired  Tobacco Use   Smoking status: Every Day    Packs/day: 0.50    Years: 55.00    Additional pack years: 0.00    Total pack years: 27.50    Types: Cigarettes    Start date: 03/11/1961   Smokeless tobacco: Never   Tobacco comments:    1/2 pack to 1 pack per day 12/14/19  Vaping Use   Vaping Use: Never used  Substance and Sexual Activity   Alcohol use: No   Drug use: No   Sexual activity: Yes    Birth control/protection: None  Other Topics Concern   Not on file  Social History Narrative   [name] is his POA and lives with him. Children out of town - one in [facility], others in [place].   Social Determinants of Health   Financial Resource Strain: Low Risk  (11/15/2021)   Overall Financial Resource Strain (CARDIA)    Difficulty of Paying Living  Expenses: Not hard at all  Food Insecurity: No Food Insecurity (05/26/2022)   Hunger Vital Sign    Worried About Running Out of Food in the Last Year: Never true    Ran Out of Food in the Last Year: Never true  Transportation Needs: No Transportation Needs (05/26/2022)   PRAPARE - Administrator, Civil Service (Medical): No    Lack of Transportation (Non-Medical): No  Physical Activity: Inactive (11/15/2021)   Exercise Vital Sign    Days of Exercise per Week: 0 days    Minutes of Exercise per Session: 0 min  Stress: No Stress Concern Present (11/15/2021)   [employer] of Occupational Health - Occupational Stress Questionnaire    Feeling of Stress : Only a little  Social Connections: Moderately Isolated (11/15/2021)   Social Connection and Isolation Panel [NHANES]    Frequency of Communication with Friends and Family: More than three times a week    Frequency of Social Gatherings with Friends and Family: Once a week    Attends Religious Services: Never     Database administrator or Organizations: No    Attends Engineer, structural: Never    Marital Status: Living with partner    Family History  Problem Relation Age of Onset   Hypertension Mother    Diabetes Father    Heart disease Father    Stroke Father    Hypertension Sister     Outpatient Encounter Medications as of 09/23/2022  Medication Sig   allopurinol (ZYLOPRIM) 300 MG tablet Take 1 tablet (300 mg total) by mouth daily.   aspirin EC 81 MG tablet Take 1 tablet (81 mg total) by mouth daily. Swallow whole.   Blood Glucose Monitoring Suppl (ONETOUCH VERIO REFLECT) w/Device KIT Use to test blood sugars daily as directed. DX: E11.9   cetirizine (ZYRTEC ALLERGY) 10 MG tablet Take 1 tablet (10 mg total) by mouth daily.   colchicine 0.6 MG tablet Take 2 tabs immediately, then 1 tab twice per day for the duration of the flare up to a max of 7 days   Continuous Glucose Receiver (FREESTYLE LIBRE 3 READER) DEVI 1 Device by Does not apply route every 14 (fourteen) days.   Continuous Glucose Sensor (FREESTYLE LIBRE 3 SENSOR) MISC 1 Device by Does not apply route continuous.   dutasteride (AVODART) 0.5 MG capsule Take 1 capsule (0.5 mg total) by mouth daily.   ezetimibe (ZETIA) 10 MG tablet TAKE 1 TABLET BY MOUTH EVERY DAY   famotidine (PEPCID) 20 MG tablet TAKE 1 TABLET BY MOUTH DAILY AFTER SUPPER   fluticasone (FLONASE) 50 MCG/ACT nasal spray PLACE 1 SPRAY INTO BOTH NOSTRILS DAILY AS NEEDED FOR ALLERGIES OR RHINITIS.   Fluticasone-Umeclidin-Vilant (TRELEGY ELLIPTA) 100-62.5-25 MCG/ACT AEPB Inhale 1 puff into the lungs daily.   gabapentin (NEURONTIN) 300 MG capsule TAKE 1 CAPSULE BY MOUTH THREE TIMES A DAY   KLOR-CON M20 20 MEQ tablet TAKE 1 TABLET BY MOUTH EVERY DAY   levothyroxine (SYNTHROID) 50 MCG tablet TAKE 1 TABLET BY MOUTH EVERY DAY   linaclotide (LINZESS) 72 MCG capsule Take 1 capsule (72 mcg total) by mouth daily as needed (constipation).   magic mouthwash w/lidocaine SOLN  Take 10 mLs by mouth 3 (three) times daily as needed for mouth pain.   metolazone (ZAROXOLYN) 2.5 MG tablet TAKE 1 TABLET BY MOUTH ON MONDAY, WEDNESDAY AND FRIDAY   metoprolol succinate (TOPROL XL) 25 MG 24 hr tablet Take 0.5 tablets (12.5 mg total) by mouth  daily.   NON FORMULARY CPAP at bedtime   nystatin (MYCOSTATIN) 100000 UNIT/ML suspension Take 5 mLs (500,000 Units total) by mouth 4 (four) times daily.   ondansetron (ZOFRAN) 4 MG tablet Take 1 tablet (4 mg total) by mouth every 8 (eight) hours as needed for nausea or vomiting.   OneTouch Delica Lancets 30G MISC Use to test blood sugars daily as directed. DX: E11.9   ONETOUCH VERIO test strip USE TO TEST BLOOD SUGARS DAILY AS DIRECTED. DX: E11.9   OXYGEN Inhale 2 L into the lungs daily as needed (with Cpap at night).   pantoprazole (PROTONIX) 20 MG tablet Take 1 tablet (20 mg total) by mouth daily.   polyethylene glycol powder (GLYCOLAX/MIRALAX) 17 GM/SCOOP powder Take 17 g by mouth 2 (two) times daily as needed.   pravastatin (PRAVACHOL) 40 MG tablet TAKE 1 TABLET (40 MG TOTAL) BY MOUTH ON MONDAYS , WEDNESDAYS, AND FRIDAYS AS DIRECTED   spironolactone (ALDACTONE) 25 MG tablet Take 1 tablet (25 mg total) by mouth daily.   torsemide (DEMADEX) 20 MG tablet Take 1 tablet (20 mg total) by mouth daily.   zolpidem (AMBIEN) 5 MG tablet Take 1 tablet (5 mg total) by mouth at bedtime as needed for sleep.   [DISCONTINUED] insulin degludec (TRESIBA FLEXTOUCH) 200 UNIT/ML FlexTouch Pen Inject 10 Units into the skin every evening. (Patient taking differently: Inject 25 Units into the skin every evening.)   insulin degludec (TRESIBA FLEXTOUCH) 200 UNIT/ML FlexTouch Pen Inject 26 Units into the skin every evening.   [EXPIRED] traMADol (ULTRAM) 50 MG tablet Take 1 tablet (50 mg total) by mouth every 8 (eight) hours as needed for up to 7 days.   No facility-administered encounter medications on file as of 09/23/2022.    ALLERGIES: Allergies  Allergen  Reactions   Sulfa Antibiotics Swelling    Mouth swelling   Atorvastatin Other (See Comments)    Muscle aches - tolerating Pravastatin 40 mg MWF   Jardiance [Empagliflozin] Other (See Comments)    FEELS SLUGGISH, TIRED   Lopressor [Metoprolol] Other (See Comments)    Fatigue   Rosuvastatin Other (See Comments)    Muscle aches - tolerating Pravastatin 40 mg MWF   Doxycycline Nausea Only   Temazepam Other (See Comments)    Made insomnia worse     VACCINATION STATUS: Immunization History  Administered Date(s) Administered   Covid-19, Mrna,Vaccine(Spikevax)36yrs and older 03/28/2022   Fluad Quad(high Dose 65+) 01/19/2019, 02/01/2020, 01/23/2021, 01/28/2022   Influenza, High Dose Seasonal PF 12/28/2015   Influenza,inj,Quad PF,6+ Mos 01/23/2017   Moderna Sars-Covid-2 Vaccination 05/10/2019, 06/07/2019, 01/31/2020   Pneumococcal Conjugate-13 06/17/2017   Pneumococcal Polysaccharide-23 04/25/2019   Respiratory Syncytial Virus Vaccine,Recomb Aduvanted(Arexvy) 03/28/2022   Tdap 05/17/2019   Zoster Recombinat (Shingrix) 11/25/2021, 02/12/2022   Zoster, Live 07/10/2014    Diabetes He presents for his follow-up diabetic visit. He has type 2 diabetes mellitus. Onset time: Was diagnosed at approx age of 89. His disease course has been improving. There are no hypoglycemic associated symptoms. Pertinent negatives for hypoglycemia include no nervousness/anxiousness or tremors. Associated symptoms include blurred vision and foot paresthesias. Pertinent negatives for diabetes include no fatigue, no polydipsia, no polyphagia and no weight loss. There are no hypoglycemic complications. Symptoms are stable. Diabetic complications include heart disease, nephropathy, peripheral neuropathy and PVD. Risk factors for coronary artery disease include diabetes mellitus, dyslipidemia, hypertension, male sex, sedentary lifestyle and tobacco exposure. Current diabetic treatment includes insulin injections. He is  compliant with treatment most of the time.  His weight is fluctuating minimally. He is following a generally healthy diet. When asked about meal planning, he reported none. He has not had a previous visit with a dietitian. He rarely participates in exercise. His home blood glucose trend is decreasing steadily. His breakfast blood glucose range is generally 110-130 mg/dl. His bedtime blood glucose range is generally >200 mg/dl. (He presents today, accompanied by his wife, with his meter (and CGM in box for assistance with starting).  He was not due for another A1c, was recently checked on 6/10 and was 8.2%, improving.  He denies any hypoglycemia.  He did have another fall recently.  ) An ACE inhibitor/angiotensin II receptor blocker is being taken. He sees a podiatrist.Eye exam is current.  Hyperlipidemia This is a chronic problem. The current episode started more than 1 year ago. The problem is controlled. Recent lipid tests were reviewed and are normal. Exacerbating diseases include chronic renal disease, diabetes and hypothyroidism. Factors aggravating his hyperlipidemia include fatty foods and beta blockers. Current antihyperlipidemic treatment includes statins. The current treatment provides moderate improvement of lipids. There are no compliance problems.  Risk factors for coronary artery disease include diabetes mellitus, dyslipidemia, hypertension, male sex and a sedentary lifestyle.  Hypertension This is a chronic problem. The current episode started more than 1 year ago. The problem has been gradually improving since onset. The problem is controlled. Associated symptoms include blurred vision and palpitations. Agents associated with hypertension include thyroid hormones. Risk factors for coronary artery disease include diabetes mellitus, dyslipidemia, sedentary lifestyle, smoking/tobacco exposure and male gender. Past treatments include angiotensin blockers, diuretics, beta blockers and alpha 1 blockers.  The current treatment provides moderate improvement. There are no compliance problems.  Hypertensive end-organ damage includes kidney disease, CAD/MI and PVD. Identifiable causes of hypertension include chronic renal disease, sleep apnea and a thyroid problem.  Thyroid Problem Presents for follow-up visit. Symptoms include palpitations. Patient reports no anxiety, cold intolerance, constipation, depressed mood, diarrhea, dry skin, fatigue, heat intolerance, tremors or weight loss. (Having cardioversion on 7/1 for Afib) The symptoms have been stable. Past treatments include levothyroxine. The treatment provided moderate relief. His past medical history is significant for diabetes and hyperlipidemia. There are no known risk factors.     Review of systems  Constitutional: + stable body weight,  current Body mass index is 23.18 kg/m. , + fatigue, no subjective hyperthermia, no subjective hypothermia Eyes: no blurry vision, no xerophthalmia ENT: no sore throat, no nodules palpated in throat, no dysphagia/odynophagia, no hoarseness Cardiovascular: no chest pain, no shortness of breath, no palpitations, no leg swelling Respiratory: no cough, no shortness of breath Gastrointestinal: no nausea/vomiting/diarrhea; constipation improved since discontinuation of Rybelsus Musculoskeletal: no muscle/joint aches Skin: no rashes, no hyperemia, + multiple abrasions from recent falls (multiple) Neurological: no tremors, + numbness/tingling to BLE, + dizziness with standing- intermittent Psychiatric: no depression, no anxiety  Objective:     BP 121/75 (BP Location: Right Arm, Patient Position: Sitting, Cuff Size: Large)   Pulse 99   Ht 5\' 6"  (1.676 m)   Wt 143 lb 9.6 oz (65.1 kg)   BMI 23.18 kg/m   Wt Readings from Last 3 Encounters:  09/23/22 143 lb 9.6 oz (65.1 kg)  09/15/22 144 lb (65.3 kg)  09/08/22 146 lb (66.2 kg)     BP Readings from Last 3 Encounters:  09/23/22 121/75  09/19/22 139/68   09/15/22 134/76      Physical Exam- Limited  Constitutional:  Body mass index is 23.18 kg/m. ,  not in acute distress, normal state of mind Eyes:  EOMI, no exophthalmos Musculoskeletal: no gross deformities, strength intact in all four extremities, no gross restriction of joint movements Skin:  no rashes, no hyperemia, multiple abrasions from recent falls (multiple) Neurological: no tremor with outstretched hands    Diabetic Foot Exam - Simple   No data filed     CMP ( most recent) CMP     Component Value Date/Time   NA 132 (L) 09/19/2022 1134   NA 136 09/15/2022 1016   K 3.8 09/19/2022 1134   CL 96 (L) 09/19/2022 1134   CO2 26 09/19/2022 1134   GLUCOSE 167 (H) 09/19/2022 1134   BUN 30 (H) 09/19/2022 1134   BUN 18 09/15/2022 1016   CREATININE 1.08 09/19/2022 1134   CALCIUM 8.8 (L) 09/19/2022 1134   PROT 7.3 09/19/2022 1134   PROT 6.6 09/15/2022 1016   ALBUMIN 3.1 (L) 09/19/2022 1134   ALBUMIN 3.7 (L) 09/15/2022 1016   AST 19 09/19/2022 1134   ALT 15 09/19/2022 1134   ALKPHOS 127 (H) 09/19/2022 1134   BILITOT 0.3 09/19/2022 1134   BILITOT 0.3 09/15/2022 1016   GFRNONAA >60 09/19/2022 1134   GFRAA 102 05/24/2020 1238     Diabetic Labs (most recent): Lab Results  Component Value Date   HGBA1C 8.2 (H) 09/15/2022   HGBA1C 8.5 (H) 06/20/2022   HGBA1C 7.8 (H) 01/28/2022   MICROALBUR 80 mg/L 09/23/2022   MICROALBUR 30 08/14/2021   MICROALBUR 30 09/24/2020     Lipid Panel ( most recent) Lipid Panel     Component Value Date/Time   CHOL 126 08/21/2020 0530   CHOL 136 04/25/2019 1608   TRIG 89 08/21/2020 0530   HDL 28 (L) 08/21/2020 0530   HDL 28 (L) 04/25/2019 1608   CHOLHDL 4.5 08/21/2020 0530   VLDL 18 08/21/2020 0530   LDLCALC 80 08/21/2020 0530   LDLCALC 78 04/25/2019 1608   LABVLDL 30 04/25/2019 1608      Lab Results  Component Value Date   TSH 2.178 09/19/2022   TSH 1.960 11/25/2021   TSH 2.510 08/23/2021   TSH 2.686 04/18/2021   TSH 3.430  01/22/2021   TSH 5.033 (H) 12/17/2020   TSH 3.410 10/26/2020   TSH 0.628 08/21/2020   TSH 1.542 08/13/2020   TSH 2.180 08/03/2020   FREET4 1.14 01/22/2021   FREET4 1.96 (H) 06/03/2020   FREET4 1.03 02/20/2017           Assessment & Plan:   1) Type 2 diabetes mellitus with diabetic polyneuropathy, without long-term current use of insulin (HCC)  - YAEL FUEHRER has currently uncontrolled symptomatic type 2 DM since 77 years of age.  He presents today, accompanied by his wife, with his meter (and CGM in box for assistance with starting).  He was not due for another A1c, was recently checked on 6/10 and was 8.2%, improving.  He denies any hypoglycemia.  He did have another fall recently.    -Recent labs reviewed.  His POCT UM shows microalbuminuria (likely why he was on Farxiga to begin with).  His kidney function via blood draw recently was normal.  Will avoid putting him back on it due to dehydration issues in the past.  - I had a long discussion with him about the progressive nature of diabetes and the pathology behind its complications. -his diabetes is complicated by CKD stage 2, PAD, and neuropathy and he remains at a high risk for more acute  and chronic complications which include CAD, CVA, and retinopathy. These are all discussed in detail with him.  - Nutritional counseling repeated at each appointment due to patients tendency to fall back in to old habits.  - The patient admits there is a room for improvement in their diet and drink choices. -  Suggestion is made for the patient to avoid simple carbohydrates from their diet including Cakes, Sweet Desserts / Pastries, Ice Cream, Soda (diet and regular), Sweet Tea, Candies, Chips, Cookies, Sweet Pastries, Store Bought Juices, Alcohol in Excess of 1-2 drinks a day, Artificial Sweeteners, Coffee Creamer, and "Sugar-free" Products. This will help patient to have stable blood glucose profile and potentially avoid unintended weight  gain.   - I encouraged the patient to switch to unprocessed or minimally processed complex starch and increased protein intake (animal or plant source), fruits, and vegetables.   - Patient is advised to stick to a routine mealtimes to eat 3 meals a day and avoid unnecessary snacks (to snack only to correct hypoglycemia).  - he will be scheduled with Norm Salt, RDN, CDE for diabetes education.  - I have approached him with the following individualized plan to manage  his diabetes and patient agrees:   -He is advised to continue his current dose of Tresiba 25 units SQ nightly for now given his at target fasting glycemic profile.  He can stay off Rybelsus and Farxiga for now.    -he is encouraged to continue monitoring blood glucose twice daily (now using his CGM), before breakfast and before bed and to call the clinic if he has readings less than 70 or greater than 200 for 3 tests in a row.  Now that he is being treated with insulin, he is an excellent candidate for CGM.  He received his Dexcom and brought it with him for assistance to start today.  I did go over proper placement and helped him set it up today.  Wife says she may bring back the first sensor to make sure she has assistance in changing it out for the first time.  - Specific targets for  A1c;  LDL, HDL,  and Triglycerides were discussed with the patient.  2) Blood Pressure /Hypertension:  his blood pressure is controlled to target.   he is advised to continue his current medications including Metoprolol 12.5 mg po daily and Torsemide 20 mg po daily, Spironolactone 25 mg po daily.  3) Lipids/Hyperlipidemia:    Review of his recent lipid panel from 08/21/20 showed controlled  LDL at 80.  he  is advised to continue Pravachol 40 mg daily at bedtime.  Side effects and precautions discussed with him.  4)  Weight/Diet:  his Body mass index is 23.18 kg/m.  -  he is is not a candidate for major weight loss. I discussed with him the  fact that loss of 5 - 10% of his  current body weight will have the most impact on his diabetes management.  Exercise, and detailed carbohydrates information provided  -  detailed on discharge instructions.  5) Hypothyroidism: His previsit thyroid function tests are consistent with appropriate hormone replacement.  He is advised to continue Levothyroxine 50 mcg po daily before breakfast.     - The correct intake of thyroid hormone (Levothyroxine, Synthroid), is on empty stomach first thing in the morning, with water, separated by at least 30 minutes from breakfast and other medications,  and separated by more than 4 hours from calcium, iron, multivitamins, acid reflux  medications (PPIs).  - This medication is a life-long medication and will be needed to correct thyroid hormone imbalances for the rest of your life.  The dose may change from time to time, based on thyroid blood work.  - It is extremely important to be consistent taking this medication, near the same time each morning.  -AVOID TAKING PRODUCTS CONTAINING BIOTIN (commonly found in Hair, Skin, Nails vitamins) AS IT INTERFERES WITH THE VALIDITY OF THYROID FUNCTION BLOOD TESTS.  6) Chronic Care/Health Maintenance: -he is on ACEI/ARB and Statin medications and is encouraged to initiate and continue to follow up with Ophthalmology, Dentist, Podiatrist at least yearly or according to recommendations, and advised to QUIT SMOKING. I have recommended yearly flu vaccine and pneumonia vaccine at least every 5 years; moderate intensity exercise for up to 150 minutes weekly; and sleep for at least 7 hours a day.  - he is advised to maintain close follow up with Junie Spencer, FNP for primary care needs, as well as his other providers for optimal and coordinated care.      I spent  59  minutes in the care of the patient today including review of labs from CMP, Lipids, Thyroid Function, Hematology (current and previous including abstractions  from other facilities); face-to-face time discussing  his blood glucose readings/logs, discussing hypoglycemia and hyperglycemia episodes and symptoms, medications doses, his options of short and long term treatment based on the latest standards of care / guidelines;  discussion about incorporating lifestyle medicine;  and documenting the encounter. Risk reduction counseling performed per USPSTF guidelines to reduce obesity and cardiovascular risk factors.     Please refer to Patient Instructions for Blood Glucose Monitoring and Insulin/Medications Dosing Guide"  in media tab for additional information. Please  also refer to " Patient Self Inventory" in the Media  tab for reviewed elements of pertinent patient history.  [name] participated in the discussions, expressed understanding, and voiced agreement with the above plans.  All questions were answered to his satisfaction. he is encouraged to contact clinic should he have any questions or concerns prior to his return visit.   Follow up plan: - Return in about 3 months (around 12/24/2022) for Diabetes F/U with A1c in office, No previsit labs, Bring meter and logs.  Ronny Bacon, [facility] [address] [place], [place] [address] Phone: [phone] Fax: [phone]  09/23/2022, 10:36 AM

## 2022-09-23 NOTE — Progress Notes (Signed)
[facility] Summary Report / Loop Recorder 

## 2022-09-25 ENCOUNTER — Telehealth: Payer: Self-pay | Admitting: *Deleted

## 2022-09-25 NOTE — Telephone Encounter (Signed)
Received a message from [facility] - Patient is having low blood sugars. He stated he has a monitor that records his sugar and it was beeping all night> He states currently it is 115, but overnight it was in the 60's Earlier this morning, 6:48 AM, he shared with them monitor that records his blood sugar it was beeping all night. It was 89 at the time of this call, but over night it was in the 60's. His insulin has been changed and his doctor said if it gets low they may have to adjust it. No s/s this morning. Copy of the call notes given to Whitney (on desk) to review further.  Patient called- Patient will be bringing hsi CGM by after 1 PM today, so that we may download it.

## 2022-09-25 NOTE — Telephone Encounter (Signed)
Recommendation noted.  

## 2022-09-25 NOTE — Telephone Encounter (Signed)
Will wait to download his CGM before adjusting anything just yet.

## 2022-09-28 NOTE — Progress Notes (Signed)
[facility] [address], [place] [address]    Clinic Day:  09/29/2022  Referring physician: Junie Spencer, FNP  Patient Care Team: Junie Spencer, FNP as PCP - General (Family Medicine) O'Neal, Ronnald Ramp, MD as PCP - Cardiology (Cardiology) Lanier Prude, MD as PCP - Electrophysiology (Cardiology) Doreatha Massed, MD as Consulting Physician (Hematology) Danella Maiers, [facility] (Pharmacist) O'Neal, Ronnald Ramp, MD as Consulting Physician (Cardiology) McKenzie, Mardene Celeste, MD as Consulting Physician (Urology) Delora Fuel, OD (Optometry) Dani Gobble, NP as Nurse Practitioner (Endocrinology) Adam Phenix, DPM as Consulting Physician (Podiatry) Cephus Shelling, MD as Consulting Physician (Vascular Surgery)   ASSESSMENT & PLAN:   Assessment: 1.  Stage IIIa (T1b N2 cM0) NSCLC, favoring adenocarcinoma: -Chemo XRT with weekly carboplatin and paclitaxel completed on 08/20/2016. -Consolidation durvalumab from 09/26/2016 through 09/25/2017. -Reviewed CT chest on 11/07/2019 which showed increasing size of the upper lobe pulmonary nodules with no new nodule in the left upper lobe suspicious for meta stasis.  Also increasing confluent soft tissue in the right suprahilar region, thought to be postradiation changes. -PET scan on 11/21/2019 showed suspected radiation changes in the right paramediastinal/perihilar region although mild hypermetabolism is present in the right retroareolar region.SUV 2.5.  While progressive soft tissue is present in this region, this hypermetabolism remains nonfocal and favors radiation changes.  7 mm central left upper lobe nodule max SUV 1.4.  9 mm aggregate nodule in the lateral right upper lobe max SUV 2.4.  These are minimally progressive from more remote prior scans.  6 mm left lower lobe nodule unchanged.  No hypermetabolic thoracic adenopathy. -CT chest on 03/23/2020 showed many of the lung nodules are stable.  Couple  of nodules have grown by few millimeters.  No new areas seen. - SBRT to the right and left lung lesions, 54 Gray in 3 fractions from 09/10/2020 through 09/17/2020. - SBRT to the RUL and LUL lesions from 06/19/2022 through 06/26/2022 by Dr. Roselind Messier.   2.  Normocytic to macrocytic anemia: - Found to have hemoglobin 6.3 on 10/09/2020 and received 2 units PRBC while hospitalized.  I have reviewed hospitalization records. - EGD on 09/06/2020 with tiny gastric ulcer without bleeding or stigmata. - Blood loss this year most likely postoperative in nature (urological surgery in February and vascular surgery in May).    Plan: 1.  Stage IIIa (T1b N2 cM0) NSCLC, favoring adenocarcinoma: - He has completed SBRT of the RUL and LUL nodules on 06/26/2022. - Reviewed CT chest (09/19/2022): Interval decrease size of RUL and LUL.  Stable LUL postradiation change.  Stable right perihilar postradiation change.  New small solid nodule of the LLL measuring 5 mm. - Recommend RTC 3 months with repeat CT of the chest.   2.  Hypothyroidism: - TSH is 2.178.  Continue same dose of Synthroid daily.   3.  Difficulty staying asleep: - Continue temazepam as needed.  4.  Normocytic to macrocytic anemia: - Hemoglobin is 13.0.  Continue iron tablet daily.    Orders Placed This Encounter  Procedures   CT Chest W Contrast    Standing Status:   Future    Standing Expiration Date:   09/29/2023    Order Specific Question:   If indicated for the ordered procedure, I authorize the administration of contrast media per Radiology protocol    Answer:   Yes    Order Specific Question:   Does the patient have a contrast media/X-ray dye allergy?  Answer:   No    Order Specific Question:   Preferred imaging location?    Answer:   [facility]      I,Katie Daubenspeck,acting as a scribe for Doreatha Massed, MD.,have documented all relevant documentation on the behalf of Doreatha Massed, MD,as directed by  Doreatha Massed, MD while in the presence of Doreatha Massed, MD.   I, Doreatha Massed MD, have reviewed the above documentation for accuracy and completeness, and I agree with the above.   Doreatha Massed, MD   6/24/20242:31 PM  CHIEF COMPLAINT:   Diagnosis: right NSCLC    Cancer Staging  Non-small cell lung cancer, right [facility]) Staging form: Lung, AJCC 8th Edition - Clinical stage from 06/05/2016: Stage IIIA (cT1b(2), cN2, cM0) - Signed by Ellouise Newer, PA-C on 08/21/2016    Prior Therapy: 1. Chemoradiation with weekly carboplatin and paclitaxel, 06/25/16 - 08/20/16. 2. Consolidation durvalumab, 09/26/16 - 09/25/17. 3. SBRT to RUL, LUL nodules 09/10/20 - 09/17/20 4. SBRT to RUL, LUL nodules 06/19/22 - 06/26/22  Current Therapy:  surveillance    HISTORY OF PRESENT ILLNESS:   Oncology History  Non-small cell lung cancer, right (HCC)  04/17/2016 Imaging   CT chest- 1. Examination is positive for bilateral upper lobe pulmonary lesions suspicious for malignancy. Further investigation with PET-CT and tissue sampling is advised. 2. Borderline enlarged paratracheal lymph nodes and right hilar nodes. Cannot rule out metastatic adenopathy. Attention to these areas on PET-CT is advised. 3. Emphysema 4. Aortic atherosclerosis and multi vessel coronary artery calcification.   04/18/2016 Initial Diagnosis   Adenocarcinoma of lung, right (HCC)   04/24/2016 PET scan   1. Adjacent hypermetabolic nodules in the RIGHT upper lobe consistent bronchogenic carcinoma. 2. Suspicion of ipsilateral hilar and paratracheal nodal metastasis. 3. Nodule in the LEFT upper lobe with suspicious morphology has a relatively low metabolic activity for size. Favor synchronous primary bronchogenic carcinoma. 4. No metabolic activity associated with a small LEFT lower lobe pulmonary nodule. Recommend attention on follow-up.   05/06/2016 Imaging   MRI brain- Negative for metastatic disease.   05/28/2016  Procedure   1. Electromagnetic navigational bronchoscopy with brushings, needle     aspirations and biopsies of bilateral upper lobe nodules. 2. Endobronchial ultrasound with mediastinal and hilar lymph node     aspirations. By Dr. Dorris Fetch   06/03/2016 Pathology Results   Diagnosis 1. Lung, biopsy, Right upper lobe - BLOOD AND BENIGN BRONCHIAL AND LUNG TISSUE. 2. Lung, biopsy, Left upper lobe - BLOOD AND BENIGN BRONCHIAL AND LUNG TISSUE.   06/03/2016 Pathology Results   Diagnosis TRANSBRONCHIAL NEEDLE ASPIRATION, NAVIGATION, RUL (SPECIMEN 5 OF 7, COLLECTED 05/28/16): MALIGNANT CELLS CONSISTENT WITH NON-SMALL CELL CARCINOMA Comment There are limited tumor cells in the cell block (insufficient for molecular testing). TTF-1 is positive in a few of the atypical cells. They appear negative for synaptophysin and cytokeratin 5/6. Overall there is limited tumor, but a lung adenocarcinoma is slightly favored. Dr. Luisa Hart has reviewed the case. The case was called to Dr. Dorris Fetch on 06/03/2016.   06/13/2016 Procedure   Port placed by Dr. Lovell Sheehan   06/25/2016 - 08/15/2016 Chemotherapy   The patient had palonosetron (ALOXI) injection 0.25 mg, 0.25 mg, Intravenous,  Once, 6 of 6 cycles  CARBOplatin (PARAPLATIN) 220 mg in sodium chloride 0.9 % 250 mL chemo infusion, 220 mg (100 % of original dose 215.8 mg), Intravenous,  Once, 6 of 6 cycles Dose modification:   (original dose 215.8 mg, Cycle 1),   (original dose  215.8 mg, Cycle 5),   (original dose 215.8 mg, Cycle 6),   (original dose 215.8 mg, Cycle 2),   (original dose 215.8 mg, Cycle 3),   (original dose 215.8 mg, Cycle 4)  PACLitaxel (TAXOL) 90 mg in dextrose 5 % 250 mL chemo infusion ( for chemotherapy treatment.     06/25/2016 - 08/20/2016 Radiation Therapy   Radiation in [place], [place] by Dr. Louis Matte.  R lung to 6600 cGy   07/09/2016 - 07/10/2016 Hospital Admission   Admit date: 07/09/2016  Admission diagnosis: Fever and chills Additional  comments: Negative work-up, discharged with course of Levaquin   07/09/2016 Treatment Plan Change   Treatment deferred x 1 week due to hospitalization    08/05/2016 - 08/06/2016 Hospital Admission   Admit date: 08/05/2016 Admission diagnosis: Joint pain Additional comments: Treated with steroids with symptomatic improvement   08/25/2016 Imaging   MRI brain- Negative for intracranial metastasis on this motion degraded examination.  Stable mild-to-moderate chronic small vessel ischemic disease.   09/18/2016 PET scan   1. Partial metabolic response. Right upper lobe 2.6 cm hypermetabolic pulmonary nodule, decreased in size and metabolism. Mildly hypermetabolic right hilar nodal metastasis, decreased in metabolism. Right paratracheal lymph node is no longer hypermetabolic. No new or progressive metastatic disease. 2. Apical left upper lobe 0.9 cm solid pulmonary nodule, slightly decreased in size, with no significant metabolism. The change in size could indicate a neoplastic etiology for this nodule. 3. Additional subcentimeter pulmonary nodules in the left lung are stable and below PET resolution . 4. Additional findings include aortic atherosclerosis, coronary atherosclerosis, moderate emphysema, stable small pericardial effusion/thickening and mild sigmoid diverticulosis.   01/13/2017 Imaging   CT chest with contrast IMPRESSION: 1. No change in size of the hypermetabolic nodular thickening in the RIGHT upper lobe. 2. LEFT upper lobe spiculated nodule is stable. 3. Rounded LEFT lower lobe pulmonary nodule is stable. 4. No evidence of lung cancer progression.   05/08/2017 Adverse Reaction   Development of Hypothyroidism- likely secondary to immunotherapy.  Levothyroxine therapy initiated.       INTERVAL HISTORY:   John [name] is a 77 y.o. male presenting to clinic today for follow up of right NSCLC. He was last seen by me on 05/21/22.  Since his last visit, he completed SBRT to the  enlarging RUL and LUL nodules from 06/19/22 through 06/26/22 under Dr. Roselind Messier.  He also underwent surveillance chest CT on 09/19/22 showing: decreased size of RUL and LUL pulmonary nodules; stable LUL and right perihilar postradiation changes; new 5 mm solid nodule of LLL.  Today, he states that he is doing well overall. His appetite level is at 100%. His energy level is at 75%.  PAST MEDICAL HISTORY:   Past 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   Anemia    Anxiety    Arthritis    Asthma    Atrial flutter (HCC)    BPH (benign prostatic hyperplasia)    with urinary retention 02/06/20   CHF (congestive heart failure) (HCC)    COPD (chronic obstructive pulmonary disease) (HCC)    Depression    Diabetes mellitus, type II (HCC)    Dyspnea    History of kidney stones    History of radiation therapy    right lung 09/10/2020-09/17/2020   Dr Roselind Messier   History of radiation therapy    Left and right Lung- 06/19/22-06/26/22   Hyperlipidemia    Hypertension    Hypothyroidism  Macular degeneration    Neuropathy    Non-small cell lung cancer, right (HCC) 04/18/2016   Peripheral vascular disease (HCC)    Prostatitis    Pulmonary nodule, left 07/16/2016   Sleep apnea    cpap    Surgical History: Past Surgical History:  Procedure Laterality Date   A-FLUTTER ABLATION N/A 01/13/2022   Procedure: A-FLUTTER ABLATION;  Surgeon: Lanier Prude, MD;  Location: [facility] INVASIVE CV LAB;  Service: Cardiovascular;  Laterality: N/A;   ABDOMINAL AORTOGRAM W/LOWER EXTREMITY Left 02/06/2020   Procedure: ABDOMINAL AORTOGRAM W/LOWER EXTREMITY;  Surgeon: Runell Gess, MD;  Location: [facility]AS[facility] CV LAB;  Service: Cardiovascular;  Laterality: Left;   ABDOMINAL AORTOGRAM W/LOWER EXTREMITY N/A 05/30/2021   Procedure: ABDOMINAL AORTOGRAM W/LOWER EXTREMITY;  Surgeon: Cephus Shelling, MD;  Location: [facility]AS[facility] CV LAB;  Service: Cardiovascular;   Laterality: N/A;   BIOPSY  09/16/2021   Procedure: BIOPSY;  Surgeon: Iva Boop, MD;  Location: [facility] ENDOSCOPY;  Service: Gastroenterology;;   CARDIOVERSION N/A 10/05/2020   Procedure: CARDIOVERSION;  Surgeon: Sande Rives, MD;  Location: [facility] ENDOSCOPY;  Service: Cardiovascular;  Laterality: N/A;   CATARACT EXTRACTION, BILATERAL Bilateral    COLONOSCOPY WITH PROPOFOL N/A 06/20/2021   Procedure: COLONOSCOPY WITH PROPOFOL;  Surgeon: Hilarie Fredrickson, MD;  Location: [facility] ENDOSCOPY;  Service: Endoscopy;  Laterality: N/A;   COLONOSCOPY WITH PROPOFOL N/A 09/16/2021   Procedure: COLONOSCOPY WITH PROPOFOL;  Surgeon: Iva Boop, MD;  Location: [facility] ENDOSCOPY;  Service: Gastroenterology;  Laterality: N/A;   ENDARTERECTOMY FEMORAL Right 08/20/2020   Procedure: ENDARTERECTOMY  RIGHT FEMORAL ARTERY;  Surgeon: Cephus Shelling, MD;  Location: [facility] OR;  Service: Vascular;  Laterality: Right;   ENTEROSCOPY N/A 10/11/2021   Procedure: ENTEROSCOPY;  Surgeon: Dolores Frame, MD;  Location: [facility] ENDO SUITE;  Service: Gastroenterology;  Laterality: N/A;   ESOPHAGOGASTRODUODENOSCOPY N/A 09/16/2021   Procedure: ESOPHAGOGASTRODUODENOSCOPY (EGD);  Surgeon: Iva Boop, MD;  Location: [facility] ENDOSCOPY;  Service: Gastroenterology;  Laterality: N/A;   ESOPHAGOGASTRODUODENOSCOPY (EGD) WITH PROPOFOL N/A 09/06/2020   Procedure: ESOPHAGOGASTRODUODENOSCOPY (EGD) WITH PROPOFOL;  Surgeon: Hilarie Fredrickson, MD;  Location: [facility] ENDOSCOPY;  Service: Endoscopy;  Laterality: N/A;   ESOPHAGOGASTRODUODENOSCOPY (EGD) WITH PROPOFOL N/A 10/11/2021   Procedure: ESOPHAGOGASTRODUODENOSCOPY (EGD) WITH PROPOFOL;  Surgeon: Dolores Frame, MD;  Location: [facility] SUITE;  Service: Gastroenterology;  Laterality: N/A;   HEMOSTASIS CLIP PLACEMENT  09/16/2021   Procedure: HEMOSTASIS CLIP PLACEMENT;  Surgeon: Iva Boop, MD;  Location: [facility] ENDOSCOPY;  Service: Gastroenterology;;   HOT HEMOSTASIS N/A 09/16/2021   Procedure: HOT  HEMOSTASIS (ARGON PLASMA COAGULATION/BICAP);  Surgeon: Iva Boop, MD;  Location: [facility] ENDOSCOPY;  Service: Gastroenterology;  Laterality: N/A;   HOT HEMOSTASIS  10/11/2021   Procedure: HOT HEMOSTASIS (ARGON PLASMA COAGULATION/BICAP);  Surgeon: Marguerita Merles, Reuel Boom, MD;  Location: [facility] ENDO SUITE;  Service: Gastroenterology;;   INSERTION OF ILIAC STENT Right 08/20/2020   Procedure: RETROGRADE INSERTION OF RIGHT ILIAC STENT;  Surgeon: Cephus Shelling, MD;  Location: [facility] OR;  Service: Vascular;  Laterality: Right;   INTRAOPERATIVE ARTERIOGRAM Right 08/20/2020   Procedure: INTRA OPERATIVE ARTERIOGRAM ILIAC;  Surgeon: Cephus Shelling, MD;  Location: [facility] OR;  Service: Vascular;  Laterality: Right;   PATCH ANGIOPLASTY Right 08/20/2020   Procedure: PATCH ANGIOPLASTY RIGHT FEMORAL ARTERY;  Surgeon: Cephus Shelling, MD;  Location: [facility] OR;  Service: Vascular;  Laterality: Right;   PERIPHERAL VASCULAR BALLOON ANGIOPLASTY Right 05/30/2021   Procedure: PERIPHERAL VASCULAR BALLOON ANGIOPLASTY;  Surgeon:  Cephus Shelling, MD;  Location: [facility] INVASIVE CV LAB;  Service: Cardiovascular;  Laterality: Right;   PORTACATH PLACEMENT Left 06/13/2016   Procedure: INSERTION PORT-A-CATH;  Surgeon: Franky Macho, MD;  Location: [facility] ORS;  Service: General;  Laterality: Left;   TRANSURETHRAL RESECTION OF PROSTATE N/A 05/31/2020   Procedure: TRANSURETHRAL RESECTION OF THE PROSTATE (TURP);  Surgeon: Malen Gauze, MD;  Location: [facility] ORS;  Service: Urology;  Laterality: N/A;   VIDEO BRONCHOSCOPY WITH ENDOBRONCHIAL NAVIGATION N/A 05/28/2016   Procedure: VIDEO BRONCHOSCOPY WITH ENDOBRONCHIAL NAVIGATION;  Surgeon: Loreli Slot, MD;  Location: [facility] OR;  Service: Thoracic;  Laterality: N/A;   VIDEO BRONCHOSCOPY WITH ENDOBRONCHIAL ULTRASOUND N/A 05/28/2016   Procedure: VIDEO BRONCHOSCOPY WITH ENDOBRONCHIAL ULTRASOUND;  Surgeon: Loreli Slot, MD;  Location: [facility] OR;  Service: Thoracic;  Laterality: N/A;     Social History: Social History   Socioeconomic History   Marital status: Significant Other    Spouse name: Not on file   Number of children: 5   Years of education: 10   Highest education level: 10th grade  Occupational History   Occupation: Retired  Tobacco Use   Smoking status: Every Day    Packs/day: 0.50    Years: 55.00    Additional pack years: 0.00    Total pack years: 27.50    Types: Cigarettes    Start date: 03/11/1961   Smokeless tobacco: Never   Tobacco comments:    1/2 pack to 1 pack per day 12/14/19  Vaping Use   Vaping Use: Never used  Substance and Sexual Activity   Alcohol use: No   Drug use: No   Sexual activity: Yes    Birth control/protection: None  Other Topics Concern   Not on file  Social History Narrative   [name] is his POA and lives with him. Children out of town - one in [facility], others in [place].   Social Determinants of Health   Financial Resource Strain: Low Risk  (11/15/2021)   Overall Financial Resource Strain (CARDIA)    Difficulty of Paying Living Expenses: Not hard at all  Food Insecurity: No Food Insecurity (05/26/2022)   Hunger Vital Sign    Worried About Running Out of Food in the Last Year: Never true    Ran Out of Food in the Last Year: Never true  Transportation Needs: No Transportation Needs (05/26/2022)   PRAPARE - Administrator, Civil Service (Medical): No    Lack of Transportation (Non-Medical): No  Physical Activity: Inactive (11/15/2021)   Exercise Vital Sign    Days of Exercise per Week: 0 days    Minutes of Exercise per Session: 0 min  Stress: No Stress Concern Present (11/15/2021)   [employer] of Occupational Health - Occupational Stress Questionnaire    Feeling of Stress : Only a little  Social Connections: Moderately Isolated (11/15/2021)   Social Connection and Isolation Panel [NHANES]    Frequency of Communication with Friends and Family: More than three times a week    Frequency of  Social Gatherings with Friends and Family: Once a week    Attends Religious Services: Never    Database administrator or Organizations: No    Attends Banker Meetings: Never    Marital Status: Living with partner  Intimate Partner Violence: Not At Risk (05/26/2022)   Humiliation, Afraid, Rape, and Kick questionnaire    Fear of Current or Ex-Partner: No    Emotionally Abused: No  Physically Abused: No    Sexually Abused: No    Family History: Family History  Problem Relation Age of Onset   Hypertension Mother    Diabetes Father    Heart disease Father    Stroke Father    Hypertension Sister     Current Medications:  Current Outpatient Medications:    allopurinol (ZYLOPRIM) 300 MG tablet, Take 1 tablet (300 mg total) by mouth daily., Disp: 90 tablet, Rfl: 1   aspirin EC 81 MG tablet, Take 1 tablet (81 mg total) by mouth daily. Swallow whole., Disp: 90 tablet, Rfl: 3   Blood Glucose Monitoring Suppl (ONETOUCH VERIO REFLECT) w/Device KIT, Use to test blood sugars daily as directed. DX: E11.9, Disp: 1 kit, Rfl: 1   cetirizine (ZYRTEC ALLERGY) 10 MG tablet, Take 1 tablet (10 mg total) by mouth daily., Disp: 90 tablet, Rfl: 1   colchicine 0.6 MG tablet, Take 2 tabs immediately, then 1 tab twice per day for the duration of the flare up to a max of 7 days, Disp: 21 tablet, Rfl: 0   Continuous Glucose Receiver (FREESTYLE LIBRE 3 READER) DEVI, 1 Device by Does not apply route every 14 (fourteen) days., Disp: 1 each, Rfl: 3   Continuous Glucose Sensor (FREESTYLE LIBRE 3 SENSOR) MISC, 1 Device by Does not apply route continuous., Disp: 1 each, Rfl: 2   dutasteride (AVODART) 0.5 MG capsule, Take 1 capsule (0.5 mg total) by mouth daily., Disp: 90 capsule, Rfl: 3   ezetimibe (ZETIA) 10 MG tablet, TAKE 1 TABLET BY MOUTH EVERY DAY, Disp: 90 tablet, Rfl: 3   famotidine (PEPCID) 20 MG tablet, TAKE 1 TABLET BY MOUTH DAILY AFTER SUPPER, Disp: 90 tablet, Rfl: 3   fluticasone (FLONASE) 50  MCG/ACT nasal spray, PLACE 1 SPRAY INTO BOTH NOSTRILS DAILY AS NEEDED FOR ALLERGIES OR RHINITIS., Disp: 48 mL, Rfl: 1   Fluticasone-Umeclidin-Vilant (TRELEGY ELLIPTA) 100-62.5-25 MCG/ACT AEPB, Inhale 1 puff into the lungs daily., Disp: 1 each, Rfl: 11   gabapentin (NEURONTIN) 300 MG capsule, TAKE 1 CAPSULE BY MOUTH THREE TIMES A DAY, Disp: 270 capsule, Rfl: 0   insulin degludec (TRESIBA FLEXTOUCH) 200 UNIT/ML FlexTouch Pen, Inject 26 Units into the skin every evening., Disp: 9 mL, Rfl: 3   KLOR-CON M20 20 MEQ tablet, TAKE 1 TABLET BY MOUTH EVERY DAY, Disp: 90 tablet, Rfl: 0   levothyroxine (SYNTHROID) 50 MCG tablet, TAKE 1 TABLET BY MOUTH EVERY DAY, Disp: 90 tablet, Rfl: 1   linaclotide (LINZESS) 72 MCG capsule, Take 1 capsule (72 mcg total) by mouth daily as needed (constipation)., Disp: , Rfl:    magic mouthwash w/lidocaine SOLN, Take 10 mLs by mouth 3 (three) times daily as needed for mouth pain., Disp: 500 mL, Rfl: 0   metolazone (ZAROXOLYN) 2.5 MG tablet, TAKE 1 TABLET BY MOUTH ON MONDAY, WEDNESDAY AND FRIDAY, Disp: 45 tablet, Rfl: 1   metoprolol succinate (TOPROL XL) 25 MG 24 hr tablet, Take 0.5 tablets (12.5 mg total) by mouth daily., Disp: 45 tablet, Rfl: 3   NON FORMULARY, CPAP at bedtime, Disp: , Rfl:    nystatin (MYCOSTATIN) 100000 UNIT/ML suspension, Take 5 mLs (500,000 Units total) by mouth 4 (four) times daily., Disp: 473 mL, Rfl: 0   ondansetron (ZOFRAN) 4 MG tablet, Take 1 tablet (4 mg total) by mouth every 8 (eight) hours as needed for nausea or vomiting., Disp: 20 tablet, Rfl: 0   OneTouch Delica Lancets 30G MISC, Use to test blood sugars daily as directed. DX: E11.9, Disp:  100 each, Rfl: 1   ONETOUCH VERIO test strip, USE TO TEST BLOOD SUGARS DAILY AS DIRECTED. DX: E11.9, Disp: 100 strip, Rfl: 3   OXYGEN, Inhale 2 L into the lungs daily as needed (with Cpap at night)., Disp: , Rfl:    pantoprazole (PROTONIX) 20 MG tablet, Take 1 tablet (20 mg total) by mouth daily., Disp: 90  tablet, Rfl: 1   polyethylene glycol powder (GLYCOLAX/MIRALAX) 17 GM/SCOOP powder, Take 17 g by mouth 2 (two) times daily as needed., Disp: 3350 g, Rfl: 1   pravastatin (PRAVACHOL) 40 MG tablet, TAKE 1 TABLET (40 MG TOTAL) BY MOUTH ON MONDAYS , WEDNESDAYS, AND FRIDAYS AS DIRECTED, Disp: 48 tablet, Rfl: 0   spironolactone (ALDACTONE) 25 MG tablet, Take 1 tablet (25 mg total) by mouth daily., Disp: 90 tablet, Rfl: 3   torsemide (DEMADEX) 20 MG tablet, Take 1 tablet (20 mg total) by mouth daily., Disp: 90 tablet, Rfl: 2   zolpidem (AMBIEN) 5 MG tablet, Take 1 tablet (5 mg total) by mouth at bedtime as needed for sleep., Disp: 30 tablet, Rfl: 2   Allergies: Allergies  Allergen Reactions   Sulfa Antibiotics Swelling    Mouth swelling   Atorvastatin Other (See Comments)    Muscle aches - tolerating Pravastatin 40 mg MWF   Jardiance [Empagliflozin] Other (See Comments)    FEELS SLUGGISH, TIRED   Lopressor [Metoprolol] Other (See Comments)    Fatigue   Rosuvastatin Other (See Comments)    Muscle aches - tolerating Pravastatin 40 mg MWF   Doxycycline Nausea Only   Temazepam Other (See Comments)    Made insomnia worse     REVIEW OF SYSTEMS:   Review of Systems  Constitutional:  Positive for fatigue. Negative for chills and fever.  HENT:   Negative for lump/mass, mouth sores, nosebleeds, sore throat and trouble swallowing.   Eyes:  Negative for eye problems.  Respiratory:  Positive for shortness of breath. Negative for cough.   Cardiovascular:  Negative for chest pain, leg swelling and palpitations.  Gastrointestinal:  Positive for constipation. Negative for abdominal pain, diarrhea, nausea and vomiting.  Genitourinary:  Negative for bladder incontinence, difficulty urinating, dysuria, frequency, hematuria and nocturia.   Musculoskeletal:  Negative for arthralgias, back pain, flank pain, myalgias and neck pain.  Skin:  Negative for itching and rash.  Neurological:  Positive for numbness.  Negative for dizziness and headaches.  Hematological:  Does not bruise/bleed easily.  Psychiatric/Behavioral:  Positive for sleep disturbance. Negative for depression and suicidal ideas. The patient is not nervous/anxious.   All other systems reviewed and are negative.    VITALS:   Blood pressure 116/68, pulse (!) 105, temperature (!) 96.9 F (36.1 C), temperature source Tympanic, resp. rate 20, weight 144 lb 4.8 oz (65.5 kg), SpO2 97 %.  Wt Readings from Last 3 Encounters:  09/29/22 144 lb 4.8 oz (65.5 kg)  09/23/22 143 lb 9.6 oz (65.1 kg)  09/15/22 144 lb (65.3 kg)    Body mass index is 23.29 kg/m.  Performance status (ECOG): 1 - Symptomatic but completely ambulatory  PHYSICAL EXAM:   Physical Exam Vitals and nursing note reviewed. Exam conducted with a chaperone present.  Constitutional:      Appearance: Normal appearance.  Cardiovascular:     Rate and Rhythm: Normal rate and regular rhythm.     Pulses: Normal pulses.     Heart sounds: Normal heart sounds.  Pulmonary:     Effort: Pulmonary effort is normal.  Breath sounds: Normal breath sounds.  Abdominal:     Palpations: Abdomen is soft. There is no hepatomegaly, splenomegaly or mass.     Tenderness: There is no abdominal tenderness.  Musculoskeletal:     Right lower leg: No edema.     Left lower leg: No edema.  Lymphadenopathy:     Cervical: No cervical adenopathy.     Right cervical: No superficial, deep or posterior cervical adenopathy.    Left cervical: No superficial, deep or posterior cervical adenopathy.     Upper Body:     Right upper body: No supraclavicular or axillary adenopathy.     Left upper body: No supraclavicular or axillary adenopathy.  Neurological:     General: No focal deficit present.     Mental Status: He is alert and oriented to person, place, and time.  Psychiatric:        Mood and Affect: Mood normal.        Behavior: Behavior normal.     LABS:      Latest Ref Rng & Units  09/19/2022   11:34 AM 09/15/2022   10:16 AM 08/15/2022    2:47 PM  CBC  WBC 4.0 - 10.5 K/uL 10.0  14.2  15.9   Hemoglobin 13.0 - 17.0 g/dL 25.3  66.4  40.3   Hematocrit 39.0 - 52.0 % 38.9  41.9  46.2   Platelets 150 - 400 K/uL 447  450  317       Latest Ref Rng & Units 09/19/2022   11:34 AM 09/15/2022   10:16 AM 08/15/2022    2:47 PM  CMP  Glucose 70 - 99 mg/dL 474  93  259   BUN 8 - 23 mg/dL 30  18  39   Creatinine 0.61 - 1.24 mg/dL 5.63  8.75  6.43   Sodium 135 - 145 mmol/L 132  136  131   Potassium 3.5 - 5.1 mmol/L 3.8  4.5  4.4   Chloride 98 - 111 mmol/L 96  97  90   CO2 22 - 32 mmol/L 26  25  22    Calcium 8.9 - 10.3 mg/dL 8.8  9.5  9.5   Total Protein 6.5 - 8.1 g/dL 7.3  6.6    Total Bilirubin 0.3 - 1.2 mg/dL 0.3  0.3    Alkaline Phos 38 - 126 U/L 127  129    AST 15 - 41 U/L 19  14    ALT 0 - 44 U/L 15  11       No results found for: "CEA1", "CEA" / No results found for: "CEA1", "CEA" No results found for: "PSA1" No results found for: "CAN199" No results found for: "CAN125"  Lab Results  Component Value Date   TOTALPROTELP 7.0 04/18/2021   ALBUMINELP 3.7 04/18/2021   A1GS 0.3 04/18/2021   A2GS 0.8 04/18/2021   BETS 1.2 04/18/2021   GAMS 1.0 04/18/2021   MSPIKE Not Observed 04/18/2021   SPEI Comment 04/18/2021   Lab Results  Component Value Date   TIBC 440 04/25/2022   TIBC 404 02/21/2022   TIBC 440 12/24/2021   FERRITIN 40 04/25/2022   FERRITIN 100 02/21/2022   FERRITIN 74 12/24/2021   IRONPCTSAT 11 (L) 04/25/2022   IRONPCTSAT 13 (L) 02/21/2022   IRONPCTSAT 21 12/24/2021   Lab Results  Component Value Date   LDH 133 08/02/2018   LDH 126 03/08/2018   LDH 141 10/28/2017     STUDIES:   CT Chest W  Contrast  Result Date: 09/25/2022 CLINICAL DATA:  Non-small cell lung cancer; * Tracking Code: BO * EXAM: CT CHEST WITH CONTRAST TECHNIQUE: Multidetector CT imaging of the chest was performed during intravenous contrast administration. RADIATION DOSE  REDUCTION: This exam was performed according to the departmental dose-optimization program which includes automated exposure control, adjustment of the mA and/or kV according to patient size and/or use of iterative reconstruction technique. CONTRAST:  75mL OMNIPAQUE IOHEXOL 300 MG/ML  SOLN COMPARISON:  PET-CT dated May 15, 2022; chest CT dated April 25, 2022 FINDINGS: Cardiovascular: Normal heart size. Trace pericardial effusion. Moderate coronary artery calcifications. Normal caliber thoracic aorta with severe atherosclerotic disease. Severe coronary artery calcifications. Mitral annular calcifications. Left chest wall port with tip in the SVC. Mediastinum/Nodes: Esophagus and thyroid are unremarkable. No enlarged lymph nodes seen in the chest. Lungs/Pleura: Interval decreased size of solid right upper lobe and left upper lobe pulmonary nodules. Posterior right upper lobe solid nodule measuring 7 x 5 mm on series 4, image 44, previously 11 x 9 mm. Solid pulmonary nodule of the left upper lobe measures 6 mm on series 4, image 63, previously 8 mm. Stable right perihilar postradiation change. Stable left upper lobe postradiation change, including posterior nodular component which demonstrated low-level FDG uptake on prior PET-CT. New small solid nodule of the left lower lobe measuring 5 mm on series 4, image 69. Stable solid left lower lobe pulmonary nodule measuring 6 mm on series 4 image 84 unchanged in size when compared with multiple priors and likely benign. No pleural effusion. Upper Abdomen: No acute abnormality. Musculoskeletal: No chest wall abnormality. No acute or significant osseous findings. IMPRESSION: 1. Interval decreased size of solid right upper lobe and left upper lobe pulmonary nodules. 2. Stable left upper lobe postradiation change, including posterior nodular component which demonstrated low-level FDG uptake on prior PET-CT. 3. Stable right perihilar postradiation change. 4. New small solid  nodule of the left lower lobe measuring 5 mm. Recommend attention on short-term follow-up in 3 months. 5. Aortic Atherosclerosis (ICD10-I70.0) and Emphysema (ICD10-J43.9). Electronically Signed   By: Allegra Lai M.D.   On: 09/25/2022 17:40   DG Wrist Complete Left  Result Date: 09/15/2022 CLINICAL DATA:  Left wrist pain.  No reported injury. EXAM: LEFT WRIST - COMPLETE 3+ VIEW COMPARISON:  Left hand dated 02/21/2022. FINDINGS: Minimal degenerative changes at the 1st metacarpal/carpal articulation. Otherwise, unremarkable examination. IMPRESSION: Minimal degenerative changes at the 1st metacarpal/carpal articulation. Electronically Signed   By: Beckie Salts M.D.   On: 09/15/2022 10:55   CUP PACEART REMOTE DEVICE CHECK  Result Date: 09/04/2022 ILR summary report received. Battery status OK. Normal device function. No new symptom, tachy, brady, or pause episodes. No new AF episodes. Monthly summary reports and ROV/PRN 15 AT events, longest duration 13hr , HR's 114-123 LA, CVRS

## 2022-09-29 ENCOUNTER — Inpatient Hospital Stay (HOSPITAL_BASED_OUTPATIENT_CLINIC_OR_DEPARTMENT_OTHER): Payer: Medicare Other | Admitting: Hematology

## 2022-09-29 ENCOUNTER — Ambulatory Visit: Payer: Medicare Other

## 2022-09-29 ENCOUNTER — Encounter: Payer: Self-pay | Admitting: Hematology

## 2022-09-29 VITALS — BP 116/68 | HR 105 | Temp 96.9°F | Resp 20 | Wt 144.3 lb

## 2022-09-29 DIAGNOSIS — E039 Hypothyroidism, unspecified: Secondary | ICD-10-CM | POA: Diagnosis not present

## 2022-09-29 DIAGNOSIS — C3491 Malignant neoplasm of unspecified part of right bronchus or lung: Secondary | ICD-10-CM | POA: Diagnosis not present

## 2022-09-29 DIAGNOSIS — R918 Other nonspecific abnormal finding of lung field: Secondary | ICD-10-CM | POA: Diagnosis not present

## 2022-09-29 DIAGNOSIS — D539 Nutritional anemia, unspecified: Secondary | ICD-10-CM | POA: Diagnosis not present

## 2022-09-29 DIAGNOSIS — Z85118 Personal history of other malignant neoplasm of bronchus and lung: Secondary | ICD-10-CM | POA: Diagnosis not present

## 2022-09-29 NOTE — Patient Instructions (Addendum)
[facility] at [facility] Discharge Instructions   You were seen and examined today by Dr. Ellin Saba.  He reviewed the results of your lab work which are normal/stable.   He reviewed the results of your CT scan which is stable. We will repeat a scan in 3 months to keep an eye on the new small nodule that was noted in the left lower lung.   Return as scheduled.    Thank you for choosing [facility] at [facility] to provide your oncology and hematology care.  To afford each patient quality time with our provider, please arrive at least 15 minutes before your scheduled appointment time.   If you have a lab appointment with the Cancer Center please come in thru the Main Entrance and check in at the main information desk.  You need to re-schedule your appointment should you arrive 10 or more minutes late.  We strive to give you quality time with our providers, and arriving late affects you and other patients whose appointments are after yours.  Also, if you no show three or more times for appointments you may be dismissed from the clinic at the providers discretion.     Again, thank you for choosing [facility].  Our hope is that these requests will decrease the amount of time that you wait before being seen by our physicians.       _____________________________________________________________  Should you have questions after your visit to [facility], please contact our office at [phone] and follow the prompts.  Our office hours are 8:00 a.m. and 4:30 p.m. Monday - Friday.  Please note that voicemails left after 4:00 p.m. may not be returned until the following business day.  We are closed weekends and major holidays.  You do have access to a nurse 24-7, just call the main number to the clinic [phone] and do not press any options, hold on the line and a nurse will answer the phone.    For prescription refill  requests, have your pharmacy contact our office and allow 72 hours.    Due to Covid, you will need to wear a mask upon entering the hospital. If you do not have a mask, a mask will be given to you at the Main Entrance upon arrival. For doctor visits, patients may have 1 support person age 82 or older with them. For treatment visits, patients can not have anyone with them due to social distancing guidelines and our immunocompromised population.

## 2022-09-30 ENCOUNTER — Ambulatory Visit (INDEPENDENT_AMBULATORY_CARE_PROVIDER_SITE_OTHER): Payer: Medicare Other

## 2022-09-30 ENCOUNTER — Other Ambulatory Visit (HOSPITAL_BASED_OUTPATIENT_CLINIC_OR_DEPARTMENT_OTHER): Payer: Self-pay | Admitting: Cardiovascular Disease

## 2022-09-30 VITALS — BP 120/74 | Ht 66.0 in | Wt 144.0 lb

## 2022-09-30 DIAGNOSIS — E1165 Type 2 diabetes mellitus with hyperglycemia: Secondary | ICD-10-CM | POA: Diagnosis not present

## 2022-09-30 DIAGNOSIS — Z794 Long term (current) use of insulin: Secondary | ICD-10-CM | POA: Diagnosis not present

## 2022-09-30 NOTE — Progress Notes (Signed)
Pt brings his Dexcom G7 in today. Went over application/usage instructions with POA [name]) present. She will be assisting him with this. They both voice understanding. Used skin tac as an extra adhesive on pts L arm. He had expressed concerns of the sensor "moving".

## 2022-10-01 ENCOUNTER — Other Ambulatory Visit: Payer: Self-pay | Admitting: Family

## 2022-10-06 ENCOUNTER — Ambulatory Visit (INDEPENDENT_AMBULATORY_CARE_PROVIDER_SITE_OTHER): Payer: Medicare Other

## 2022-10-06 DIAGNOSIS — I4892 Unspecified atrial flutter: Secondary | ICD-10-CM

## 2022-10-07 ENCOUNTER — Encounter: Payer: Self-pay | Admitting: Family

## 2022-10-07 ENCOUNTER — Ambulatory Visit (INDEPENDENT_AMBULATORY_CARE_PROVIDER_SITE_OTHER): Payer: Medicare Other

## 2022-10-07 ENCOUNTER — Ambulatory Visit (INDEPENDENT_AMBULATORY_CARE_PROVIDER_SITE_OTHER): Payer: Medicare Other | Admitting: Family

## 2022-10-07 VITALS — BP 124/74 | HR 104 | Temp 97.7°F | Ht 66.0 in | Wt 143.0 lb

## 2022-10-07 DIAGNOSIS — R1031 Right lower quadrant pain: Secondary | ICD-10-CM

## 2022-10-07 DIAGNOSIS — R296 Repeated falls: Secondary | ICD-10-CM

## 2022-10-07 DIAGNOSIS — K5900 Constipation, unspecified: Secondary | ICD-10-CM | POA: Diagnosis not present

## 2022-10-07 DIAGNOSIS — R531 Weakness: Secondary | ICD-10-CM | POA: Diagnosis not present

## 2022-10-07 LAB — CUP PACEART REMOTE DEVICE CHECK
Date Time Interrogation Session: 20240701045600
Implantable Pulse Generator Implant Date: 20240126
Pulse Gen Serial Number: 104324

## 2022-10-07 NOTE — Patient Instructions (Signed)
Abdominal Pain, Adult  Pain in the abdomen (abdominal pain) can be caused by many things. In most cases, it gets better with no treatment or by being treated at home. But in some cases, it can be serious. Your health care provider will ask questions about your medical history and do a physical exam to try to figure out what is causing your pain. Follow these instructions at home: Medicines Take over-the-counter and prescription medicines only as told by your provider. Do not take medicines that help you poop (laxatives) unless told by your provider. General instructions Watch your condition for any changes. Drink enough fluid to keep your pee (urine) pale yellow. Contact a health care provider if: Your pain changes, gets worse, or lasts longer than expected. You have severe cramping or bloating in your abdomen, or you vomit. Your pain gets worse with meals, after eating, or with certain foods. You are constipated or have diarrhea for more than 2-3 days. You are not hungry, or you lose weight without trying. You have signs of dehydration. These may include: Dark pee, very little pee, or no pee. Cracked lips or dry mouth. Sleepiness or weakness. You have pain when you pee (urinate) or poop. Your abdominal pain wakes you up at night. You have blood in your pee. You have a fever. Get help right away if: You cannot stop vomiting. Your pain is only in one part of the abdomen. Pain on the right side could be caused by appendicitis. You have bloody or black poop (stool), or poop that looks like tar. You have trouble breathing. You have chest pain. These symptoms may be an emergency. Get help right away. Call 911. Do not wait to see if the symptoms will go away. Do not drive yourself to the hospital. This information is not intended to replace advice given to you by your health care provider. Make sure you discuss any questions you have with your health care provider. Document Revised:  01/08/2022 Document Reviewed: 01/08/2022 Elsevier Patient Education  2024 [employer].  

## 2022-10-07 NOTE — Progress Notes (Signed)
Subjective:    Patient ID: John [name], male    DOB: 23-Mar-1946, 77 y.o.   MRN: [id number]  Chief Complaint  Patient presents with   Abdominal Pain   Fall   Pt presents to the office today with right lower abdominal pain that started a week ago. Reports mild constipation.   Reports he fell three days ago when trying to get the trash out. Larey Seat backwards and landed on his buttocks and left arm.  Abdominal Pain This is a new problem. The current episode started 1 to 4 weeks ago. The problem occurs intermittently. The pain is located in the RUQ. The pain is moderate. The quality of the pain is aching. The abdominal pain radiates to the periumbilical region and RLQ. Associated symptoms include constipation. The pain is aggravated by coughing and certain positions. He has tried nothing for the symptoms. The treatment provided no relief.  Fall Associated symptoms include abdominal pain.  Constipation This is a chronic problem. The current episode started more than 1 year ago. His stool frequency is 4 to 5 times per week. Associated symptoms include abdominal pain. Risk factors include obesity. He has tried laxatives for the symptoms. The treatment provided mild relief.      Review of Systems  Gastrointestinal:  Positive for abdominal pain and constipation.  All other systems reviewed and are negative.      Objective:   Physical Exam Vitals reviewed.  Constitutional:      General: He is not in acute distress.    Appearance: He is well-developed.  HENT:     Head: Normocephalic.     Right Ear: External ear normal.     Left Ear: External ear normal.  Eyes:     General:        Right eye: No discharge.        Left eye: No discharge.     Pupils: Pupils are equal, round, and reactive to light.  Neck:     Thyroid: No thyromegaly.  Cardiovascular:     Rate and Rhythm: Normal rate and regular rhythm.     Heart sounds: Normal heart sounds. No murmur heard. Pulmonary:     Effort:  Pulmonary effort is normal. No respiratory distress.     Breath sounds: Normal breath sounds. No wheezing.  Abdominal:     General: Bowel sounds are normal. There is no distension.     Palpations: Abdomen is soft.     Tenderness: There is abdominal tenderness in the periumbilical area.  Musculoskeletal:        General: No tenderness. Normal range of motion.     Cervical back: Normal range of motion and neck supple.  Skin:    General: Skin is warm and dry.     Findings: No erythema or rash.  Neurological:     Mental Status: He is alert and oriented to person, place, and time.     Cranial Nerves: No cranial nerve deficit.     Deep Tendon Reflexes: Reflexes are normal and symmetric.  Psychiatric:        Behavior: Behavior normal.        Thought Content: Thought content normal.        Judgment: Judgment normal.      BP 124/74   Pulse (!) 104   Temp 97.7 F (36.5 C) (Temporal)   Ht 5\' 6"  (1.676 m)   Wt 143 lb (64.9 kg)   BMI 23.08 kg/m  Assessment & Plan:  ACHYUT CANNADY comes in today with chief complaint of Abdominal Pain and Fall   Diagnosis and orders addressed:  1. Right lower quadrant abdominal pain - CMP14+EGFR - CBC with Differential/Platelet - DG Abd 1 View  2. Frequent falls - For home use only DME 4 wheeled rolling walker with seat ([id number]) - CBC with Differential/Platelet  3. General weakness  - For home use only DME 4 wheeled rolling walker with seat ([id number]) - CBC with Differential/Platelet   Labs pending KUB pending  If pain continues will need CT abdomen ordered Pt will start taking Linzess daily Health Maintenance reviewed Diet and exercise encouraged     Jannifer Rodney, FNP

## 2022-10-08 LAB — CBC WITH DIFFERENTIAL/PLATELET
Basophils Absolute: 0.1 10*3/uL (ref 0.0–0.2)
Basos: 1 %
EOS (ABSOLUTE): 0.2 10*3/uL (ref 0.0–0.4)
Eos: 3 %
Hematocrit: 40.9 % (ref 37.5–51.0)
Hemoglobin: 13.7 g/dL (ref 13.0–17.7)
Immature Grans (Abs): 0 10*3/uL (ref 0.0–0.1)
Immature Granulocytes: 0 %
Lymphocytes Absolute: 0.7 10*3/uL (ref 0.7–3.1)
Lymphs: 8 %
MCH: 32.4 pg (ref 26.6–33.0)
MCHC: 33.5 g/dL (ref 31.5–35.7)
MCV: 97 fL (ref 79–97)
Monocytes Absolute: 0.8 10*3/uL (ref 0.1–0.9)
Monocytes: 9 %
Neutrophils Absolute: 7.2 10*3/uL — ABNORMAL HIGH (ref 1.4–7.0)
Neutrophils: 79 %
Platelets: 322 10*3/uL (ref 150–450)
RBC: 4.23 x10E6/uL (ref 4.14–5.80)
RDW: 14.3 % (ref 11.6–15.4)
WBC: 9.1 10*3/uL (ref 3.4–10.8)

## 2022-10-08 LAB — CMP14+EGFR
ALT: 20 IU/L (ref 0–44)
AST: 20 IU/L (ref 0–40)
Albumin: 4.1 g/dL (ref 3.8–4.8)
Alkaline Phosphatase: 154 IU/L — ABNORMAL HIGH (ref 44–121)
BUN/Creatinine Ratio: 31 — ABNORMAL HIGH (ref 10–24)
BUN: 30 mg/dL — ABNORMAL HIGH (ref 8–27)
Bilirubin Total: 0.2 mg/dL (ref 0.0–1.2)
CO2: 26 mmol/L (ref 20–29)
Calcium: 9.6 mg/dL (ref 8.6–10.2)
Chloride: 94 mmol/L — ABNORMAL LOW (ref 96–106)
Creatinine, Ser: 0.97 mg/dL (ref 0.76–1.27)
Globulin, Total: 3.1 g/dL (ref 1.5–4.5)
Glucose: 173 mg/dL — ABNORMAL HIGH (ref 70–99)
Potassium: 4.6 mmol/L (ref 3.5–5.2)
Sodium: 135 mmol/L (ref 134–144)
Total Protein: 7.2 g/dL (ref 6.0–8.5)
eGFR: 81 mL/min/{1.73_m2} (ref >59)

## 2022-10-13 DIAGNOSIS — G4700 Insomnia, unspecified: Secondary | ICD-10-CM | POA: Diagnosis not present

## 2022-10-13 DIAGNOSIS — R338 Other retention of urine: Secondary | ICD-10-CM | POA: Diagnosis not present

## 2022-10-13 DIAGNOSIS — I6521 Occlusion and stenosis of right carotid artery: Secondary | ICD-10-CM | POA: Diagnosis not present

## 2022-10-13 DIAGNOSIS — D500 Iron deficiency anemia secondary to blood loss (chronic): Secondary | ICD-10-CM | POA: Diagnosis not present

## 2022-10-13 DIAGNOSIS — I110 Hypertensive heart disease with heart failure: Secondary | ICD-10-CM | POA: Diagnosis not present

## 2022-10-13 DIAGNOSIS — I5033 Acute on chronic diastolic (congestive) heart failure: Secondary | ICD-10-CM | POA: Diagnosis not present

## 2022-10-13 DIAGNOSIS — K5900 Constipation, unspecified: Secondary | ICD-10-CM | POA: Diagnosis not present

## 2022-10-13 DIAGNOSIS — R531 Weakness: Secondary | ICD-10-CM | POA: Diagnosis not present

## 2022-10-13 DIAGNOSIS — K253 Acute gastric ulcer without hemorrhage or perforation: Secondary | ICD-10-CM | POA: Diagnosis not present

## 2022-10-13 DIAGNOSIS — I4892 Unspecified atrial flutter: Secondary | ICD-10-CM | POA: Diagnosis not present

## 2022-10-13 DIAGNOSIS — N401 Enlarged prostate with lower urinary tract symptoms: Secondary | ICD-10-CM | POA: Diagnosis not present

## 2022-10-13 DIAGNOSIS — Z9181 History of falling: Secondary | ICD-10-CM | POA: Diagnosis not present

## 2022-10-13 DIAGNOSIS — E1142 Type 2 diabetes mellitus with diabetic polyneuropathy: Secondary | ICD-10-CM | POA: Diagnosis not present

## 2022-10-13 DIAGNOSIS — J449 Chronic obstructive pulmonary disease, unspecified: Secondary | ICD-10-CM | POA: Diagnosis not present

## 2022-10-13 DIAGNOSIS — J9611 Chronic respiratory failure with hypoxia: Secondary | ICD-10-CM | POA: Diagnosis not present

## 2022-10-13 DIAGNOSIS — Z7951 Long term (current) use of inhaled steroids: Secondary | ICD-10-CM | POA: Diagnosis not present

## 2022-10-13 DIAGNOSIS — Z87891 Personal history of nicotine dependence: Secondary | ICD-10-CM | POA: Diagnosis not present

## 2022-10-13 DIAGNOSIS — I4891 Unspecified atrial fibrillation: Secondary | ICD-10-CM | POA: Diagnosis not present

## 2022-10-13 DIAGNOSIS — E1151 Type 2 diabetes mellitus with diabetic peripheral angiopathy without gangrene: Secondary | ICD-10-CM | POA: Diagnosis not present

## 2022-10-13 DIAGNOSIS — K219 Gastro-esophageal reflux disease without esophagitis: Secondary | ICD-10-CM | POA: Diagnosis not present

## 2022-10-13 DIAGNOSIS — E876 Hypokalemia: Secondary | ICD-10-CM | POA: Diagnosis not present

## 2022-10-13 DIAGNOSIS — Z556 Problems related to health literacy: Secondary | ICD-10-CM | POA: Diagnosis not present

## 2022-10-13 DIAGNOSIS — Z7982 Long term (current) use of aspirin: Secondary | ICD-10-CM | POA: Diagnosis not present

## 2022-10-13 DIAGNOSIS — E039 Hypothyroidism, unspecified: Secondary | ICD-10-CM | POA: Diagnosis not present

## 2022-10-14 ENCOUNTER — Telehealth: Payer: Self-pay | Admitting: Family

## 2022-10-14 NOTE — Telephone Encounter (Signed)
FYI

## 2022-10-15 ENCOUNTER — Other Ambulatory Visit: Payer: Self-pay | Admitting: Family

## 2022-10-15 ENCOUNTER — Other Ambulatory Visit (HOSPITAL_BASED_OUTPATIENT_CLINIC_OR_DEPARTMENT_OTHER): Payer: Self-pay | Admitting: Cardiovascular Disease

## 2022-10-15 DIAGNOSIS — K219 Gastro-esophageal reflux disease without esophagitis: Secondary | ICD-10-CM

## 2022-10-15 MED ORDER — METOLAZONE 2.5 MG PO TABS: 2.5000 mg | ORAL_TABLET | ORAL | 1 refills | Status: DC

## 2022-10-15 NOTE — Addendum Note (Signed)
Addended by: Ferne Reus on: 10/15/2022 12:12 PM   Modules accepted: Orders

## 2022-10-21 ENCOUNTER — Ambulatory Visit (INDEPENDENT_AMBULATORY_CARE_PROVIDER_SITE_OTHER): Payer: Medicare Other

## 2022-10-21 DIAGNOSIS — J9611 Chronic respiratory failure with hypoxia: Secondary | ICD-10-CM | POA: Diagnosis not present

## 2022-10-21 DIAGNOSIS — E1142 Type 2 diabetes mellitus with diabetic polyneuropathy: Secondary | ICD-10-CM | POA: Diagnosis not present

## 2022-10-21 DIAGNOSIS — E1151 Type 2 diabetes mellitus with diabetic peripheral angiopathy without gangrene: Secondary | ICD-10-CM

## 2022-10-21 DIAGNOSIS — I4891 Unspecified atrial fibrillation: Secondary | ICD-10-CM | POA: Diagnosis not present

## 2022-10-21 DIAGNOSIS — R531 Weakness: Secondary | ICD-10-CM

## 2022-10-21 DIAGNOSIS — J449 Chronic obstructive pulmonary disease, unspecified: Secondary | ICD-10-CM

## 2022-10-21 DIAGNOSIS — K219 Gastro-esophageal reflux disease without esophagitis: Secondary | ICD-10-CM | POA: Diagnosis not present

## 2022-10-21 DIAGNOSIS — I110 Hypertensive heart disease with heart failure: Secondary | ICD-10-CM | POA: Diagnosis not present

## 2022-10-21 DIAGNOSIS — I4892 Unspecified atrial flutter: Secondary | ICD-10-CM

## 2022-10-21 DIAGNOSIS — I5033 Acute on chronic diastolic (congestive) heart failure: Secondary | ICD-10-CM

## 2022-10-22 NOTE — Progress Notes (Signed)
[employer] 

## 2022-10-23 ENCOUNTER — Other Ambulatory Visit: Payer: Self-pay | Admitting: Family

## 2022-10-23 DIAGNOSIS — E1169 Type 2 diabetes mellitus with other specified complication: Secondary | ICD-10-CM

## 2022-10-25 ENCOUNTER — Other Ambulatory Visit: Payer: Self-pay | Admitting: Urology

## 2022-10-28 DIAGNOSIS — B351 Tinea unguium: Secondary | ICD-10-CM | POA: Diagnosis not present

## 2022-10-28 DIAGNOSIS — L84 Corns and callosities: Secondary | ICD-10-CM | POA: Diagnosis not present

## 2022-10-28 DIAGNOSIS — E1142 Type 2 diabetes mellitus with diabetic polyneuropathy: Secondary | ICD-10-CM | POA: Diagnosis not present

## 2022-10-28 DIAGNOSIS — M79676 Pain in unspecified toe(s): Secondary | ICD-10-CM | POA: Diagnosis not present

## 2022-10-29 DIAGNOSIS — I5033 Acute on chronic diastolic (congestive) heart failure: Secondary | ICD-10-CM | POA: Diagnosis not present

## 2022-10-29 DIAGNOSIS — J449 Chronic obstructive pulmonary disease, unspecified: Secondary | ICD-10-CM | POA: Diagnosis not present

## 2022-10-29 DIAGNOSIS — I4891 Unspecified atrial fibrillation: Secondary | ICD-10-CM | POA: Diagnosis not present

## 2022-10-29 DIAGNOSIS — I110 Hypertensive heart disease with heart failure: Secondary | ICD-10-CM | POA: Diagnosis not present

## 2022-10-29 DIAGNOSIS — E1142 Type 2 diabetes mellitus with diabetic polyneuropathy: Secondary | ICD-10-CM | POA: Diagnosis not present

## 2022-10-29 DIAGNOSIS — E1151 Type 2 diabetes mellitus with diabetic peripheral angiopathy without gangrene: Secondary | ICD-10-CM | POA: Diagnosis not present

## 2022-10-31 ENCOUNTER — Ambulatory Visit (INDEPENDENT_AMBULATORY_CARE_PROVIDER_SITE_OTHER): Payer: Medicare Other | Admitting: Urology

## 2022-10-31 VITALS — BP 121/65 | HR 101 | Ht 66.0 in | Wt 143.0 lb

## 2022-10-31 DIAGNOSIS — R339 Retention of urine, unspecified: Secondary | ICD-10-CM | POA: Diagnosis not present

## 2022-10-31 DIAGNOSIS — N138 Other obstructive and reflux uropathy: Secondary | ICD-10-CM | POA: Diagnosis not present

## 2022-10-31 DIAGNOSIS — N401 Enlarged prostate with lower urinary tract symptoms: Secondary | ICD-10-CM

## 2022-10-31 LAB — URINALYSIS, ROUTINE W REFLEX MICROSCOPIC
Bilirubin, UA: NEGATIVE
Glucose, UA: NEGATIVE
Leukocytes,UA: NEGATIVE
Nitrite, UA: NEGATIVE
Protein,UA: NEGATIVE
RBC, UA: NEGATIVE
Specific Gravity, UA: 1.025 (ref 1.005–1.030)
Urobilinogen, Ur: 1 mg/dL (ref 0.2–1.0)
pH, UA: 5.5 (ref 5.0–7.5)

## 2022-10-31 MED ORDER — DUTASTERIDE 0.5 MG PO CAPS: 0.5000 mg | ORAL_CAPSULE | Freq: Every day | ORAL | 3 refills | Status: DC

## 2022-10-31 NOTE — Progress Notes (Unsigned)
10/31/2022 10:09 AM   John [name] 11-May-1945 [id number]  Referring provider: Junie Spencer, FNP [address] [place],  [place] [address]  Followup BPH   HPI: John [name] is a 84XL here for followup for BPH with urinary retention. IPSS 0 QOL 0 on dutasteride 0.5mg  daily. Urine stream strong. No straining to urinate. No nocturia. He has occasional post void dribbling   PMH: Past Medical History:  Diagnosis Date   Adenocarcinoma of lung, right (HCC) 04/18/2016   Anemia    Anxiety    Arthritis    Asthma    Atrial flutter (HCC)    BPH (benign prostatic hyperplasia)    with urinary retention 02/06/20   CHF (congestive heart failure) (HCC)    COPD (chronic obstructive pulmonary disease) (HCC)    Depression    Diabetes mellitus, type II (HCC)    Dyspnea    History of kidney stones    History of radiation therapy    right lung 09/10/2020-09/17/2020   Dr Roselind Messier   History of radiation therapy    Left and right Lung- 06/19/22-06/26/22   Hyperlipidemia    Hypertension    Hypothyroidism    Macular degeneration    Neuropathy    Non-small cell lung cancer, right (HCC) 04/18/2016   Peripheral vascular disease (HCC)    Prostatitis    Pulmonary nodule, left 07/16/2016   Sleep apnea    cpap    Surgical History: Past Surgical History:  Procedure Laterality Date   A-FLUTTER ABLATION N/A 01/13/2022   Procedure: A-FLUTTER ABLATION;  Surgeon: Lanier Prude, MD;  [facility]: [facility];  Service: Cardiovascular;  Laterality: N/A;   ABDOMINAL AORTOGRAM W/LOWER EXTREMITY Left 02/06/2020   Procedure: ABDOMINAL AORTOGRAM W/LOWER EXTREMITY;  Surgeon: Runell Gess, MD;  Location: [facility];  Service: Cardiovascular;  Laterality: Left;   ABDOMINAL AORTOGRAM W/LOWER EXTREMITY N/A 05/30/2021   Procedure: ABDOMINAL AORTOGRAM W/LOWER EXTREMITY;  Surgeon: Cephus Shelling, MD;  Location: [facility];  Service: Cardiovascular;  Laterality: N/A;   BIOPSY   09/16/2021   Procedure: BIOPSY;  Surgeon: Iva Boop, MD;  Location: [facility] ENDOSCOPY;  Service: Gastroenterology;;   CARDIOVERSION N/A 10/05/2020   Procedure: CARDIOVERSION;  Surgeon: Sande Rives, MD;  Location: [facility] ENDOSCOPY;  Service: Cardiovascular;  Laterality: N/A;   CATARACT EXTRACTION, BILATERAL Bilateral    COLONOSCOPY WITH PROPOFOL N/A 06/20/2021   Procedure: COLONOSCOPY WITH PROPOFOL;  Surgeon: Hilarie Fredrickson, MD;  Location: [facility] ENDOSCOPY;  Service: Endoscopy;  Laterality: N/A;   COLONOSCOPY WITH PROPOFOL N/A 09/16/2021   Procedure: COLONOSCOPY WITH PROPOFOL;  Surgeon: Iva Boop, MD;  Location: [facility] ENDOSCOPY;  Service: Gastroenterology;  Laterality: N/A;   ENDARTERECTOMY FEMORAL Right 08/20/2020   Procedure: ENDARTERECTOMY  RIGHT FEMORAL ARTERY;  Surgeon: Cephus Shelling, MD;  Location: [facility] OR;  Service: Vascular;  Laterality: Right;   ENTEROSCOPY N/A 10/11/2021   Procedure: ENTEROSCOPY;  Surgeon: Dolores Frame, MD;  Location: [facility] ENDO SUITE;  Service: Gastroenterology;  Laterality: N/A;   ESOPHAGOGASTRODUODENOSCOPY N/A 09/16/2021   Procedure: ESOPHAGOGASTRODUODENOSCOPY (EGD);  Surgeon: Iva Boop, MD;  Location: [facility] ENDOSCOPY;  Service: Gastroenterology;  Laterality: N/A;   ESOPHAGOGASTRODUODENOSCOPY (EGD) WITH PROPOFOL N/A 09/06/2020   Procedure: ESOPHAGOGASTRODUODENOSCOPY (EGD) WITH PROPOFOL;  Surgeon: Hilarie Fredrickson, MD;  Location: [facility] ENDOSCOPY;  Service: Endoscopy;  Laterality: N/A;   ESOPHAGOGASTRODUODENOSCOPY (EGD) WITH PROPOFOL N/A 10/11/2021   Procedure: ESOPHAGOGASTRODUODENOSCOPY (EGD) WITH PROPOFOL;  Surgeon: Dolores Frame, MD;  Location: [facility]O [facility];  Service: Gastroenterology;  Laterality: N/A;   HEMOSTASIS CLIP PLACEMENT  09/16/2021   Procedure: HEMOSTASIS CLIP PLACEMENT;  Surgeon: Iva Boop, MD;  Location: [facility] ENDOSCOPY;  Service: Gastroenterology;;   HOT HEMOSTASIS N/A 09/16/2021   Procedure: HOT HEMOSTASIS (ARGON PLASMA  COAGULATION/BICAP);  Surgeon: Iva Boop, MD;  Location: [facility] ENDOSCOPY;  Service: Gastroenterology;  Laterality: N/A;   HOT HEMOSTASIS  10/11/2021   Procedure: HOT HEMOSTASIS (ARGON PLASMA COAGULATION/BICAP);  Surgeon: Marguerita Merles, Reuel Boom, MD;  Location: [facility]O [facility];  Service: Gastroenterology;;   INSERTION OF ILIAC STENT Right 08/20/2020   Procedure: RETROGRADE INSERTION OF RIGHT ILIAC STENT;  Surgeon: Cephus Shelling, MD;  Location: [facility] OR;  Service: Vascular;  Laterality: Right;   INTRAOPERATIVE ARTERIOGRAM Right 08/20/2020   Procedure: INTRA OPERATIVE ARTERIOGRAM ILIAC;  Surgeon: Cephus Shelling, MD;  Location: [facility] OR;  Service: Vascular;  Laterality: Right;   PATCH ANGIOPLASTY Right 08/20/2020   Procedure: PATCH ANGIOPLASTY RIGHT FEMORAL ARTERY;  Surgeon: Cephus Shelling, MD;  Location: [facility] OR;  Service: Vascular;  Laterality: Right;   PERIPHERAL VASCULAR BALLOON ANGIOPLASTY Right 05/30/2021   Procedure: PERIPHERAL VASCULAR BALLOON ANGIOPLASTY;  Surgeon: Cephus Shelling, MD;  [facility];  Service: Cardiovascular;  Laterality: Right;   PORTACATH PLACEMENT Left 06/13/2016   Procedure: INSERTION PORT-A-CATH;  Surgeon: Franky Macho, MD;  Location: [facility] ORS;  Service: General;  Laterality: Left;   TRANSURETHRAL RESECTION OF PROSTATE N/A 05/31/2020   Procedure: TRANSURETHRAL RESECTION OF THE PROSTATE (TURP);  Surgeon: Malen Gauze, MD;  Location: [facility] ORS;  Service: Urology;  Laterality: N/A;   VIDEO BRONCHOSCOPY WITH ENDOBRONCHIAL NAVIGATION N/A 05/28/2016   Procedure: VIDEO BRONCHOSCOPY WITH ENDOBRONCHIAL NAVIGATION;  Surgeon: Loreli Slot, MD;  Location: [facility] OR;  Service: Thoracic;  Laterality: N/A;   VIDEO BRONCHOSCOPY WITH ENDOBRONCHIAL ULTRASOUND N/A 05/28/2016   Procedure: VIDEO BRONCHOSCOPY WITH ENDOBRONCHIAL ULTRASOUND;  Surgeon: Loreli Slot, MD;  Location: [facility] OR;  Service: Thoracic;  Laterality: N/A;    Home Medications:  Allergies  as of 10/31/2022       Reactions   Sulfa Antibiotics Swelling   Mouth swelling   Atorvastatin Other (See Comments)   Muscle aches - tolerating Pravastatin 40 mg MWF   Jardiance [empagliflozin] Other (See Comments)   FEELS SLUGGISH, TIRED   Lopressor [metoprolol] Other (See Comments)   Fatigue   Rosuvastatin Other (See Comments)   Muscle aches - tolerating Pravastatin 40 mg MWF   Doxycycline Nausea Only   Temazepam Other (See Comments)   Made insomnia worse         Medication List        Accurate as of October 31, 2022 10:09 AM. If you have any questions, ask your nurse or doctor.          allopurinol 300 MG tablet Commonly known as: Zyloprim Take 1 tablet (300 mg total) by mouth daily.   aspirin EC 81 MG tablet Take 1 tablet (81 mg total) by mouth daily. Swallow whole.   cetirizine 10 MG tablet Commonly known as: ZyrTEC Allergy Take 1 tablet (10 mg total) by mouth daily.   colchicine 0.6 MG tablet Take 2 tabs immediately, then 1 tab twice per day for the duration of the flare up to a max of 7 days   dutasteride 0.5 MG capsule Commonly known as: AVODART TAKE 1 CAPSULE BY MOUTH EVERY DAY   ezetimibe 10 MG tablet Commonly known as: ZETIA TAKE 1 TABLET  BY MOUTH EVERY DAY   famotidine 20 MG tablet Commonly known as: PEPCID TAKE 1 TABLET BY MOUTH DAILY AFTER SUPPER   fluticasone 50 MCG/ACT nasal spray Commonly known as: FLONASE PLACE 1 SPRAY INTO BOTH NOSTRILS DAILY AS NEEDED FOR ALLERGIES OR RHINITIS.   FreeStyle Libre 3 Reader Devi 1 Device by Does not apply route every 14 (fourteen) days.   FreeStyle Libre 3 Sensor Misc 1 Device by Does not apply route continuous.   gabapentin 300 MG capsule Commonly known as: NEURONTIN TAKE 1 CAPSULE BY MOUTH THREE TIMES A DAY   Klor-Con M20 20 MEQ tablet Generic drug: potassium chloride SA TAKE 1 TABLET BY MOUTH EVERY DAY   levothyroxine 50 MCG tablet Commonly known as: SYNTHROID TAKE 1 TABLET BY MOUTH EVERY  DAY   linaclotide 72 MCG capsule Commonly known as: Linzess Take 1 capsule (72 mcg total) by mouth daily as needed (constipation).   magic mouthwash w/lidocaine Soln Take 10 mLs by mouth 3 (three) times daily as needed for mouth pain.   metolazone 2.5 MG tablet Commonly known as: ZAROXOLYN Take 1 tablet (2.5 mg total) by mouth 3 (three) times a week.   metoprolol succinate 25 MG 24 hr tablet Commonly known as: Toprol XL Take 0.5 tablets (12.5 mg total) by mouth daily.   NON FORMULARY CPAP at bedtime   nystatin 100000 UNIT/ML suspension Commonly known as: MYCOSTATIN Take 5 mLs (500,000 Units total) by mouth 4 (four) times daily.   ondansetron 4 MG tablet Commonly known as: Zofran Take 1 tablet (4 mg total) by mouth every 8 (eight) hours as needed for nausea or vomiting.   OneTouch Delica Lancets 30G Misc Use to test blood sugars daily as directed. DX: E11.9   OneTouch Verio Reflect w/Device Kit Use to test blood sugars daily as directed. DX: E11.9   OneTouch Verio test strip Generic drug: glucose blood USE TO TEST BLOOD SUGARS DAILY AS DIRECTED. DX: E11.9   OXYGEN Inhale 2 L into the lungs daily as needed (with Cpap at night).   pantoprazole 20 MG tablet Commonly known as: PROTONIX TAKE 1 TABLET BY MOUTH EVERY DAY   polyethylene glycol powder 17 GM/SCOOP powder Commonly known as: GLYCOLAX/MIRALAX Take 17 g by mouth 2 (two) times daily as needed.   pravastatin 40 MG tablet Commonly known as: PRAVACHOL TAKE 1 TABLET (40 MG TOTAL) BY MOUTH ON MONDAYS , WEDNESDAYS, AND FRIDAYS AS DIRECTED   spironolactone 25 MG tablet Commonly known as: ALDACTONE TAKE 1 TABLET (25 MG TOTAL) BY MOUTH DAILY.   torsemide 20 MG tablet Commonly known as: DEMADEX Take 1 tablet (20 mg total) by mouth daily.   Trelegy Ellipta 100-62.5-25 MCG/ACT Aepb Generic drug: Fluticasone-Umeclidin-Vilant Inhale 1 puff into the lungs daily.   Evaristo Bury FlexTouch 200 UNIT/ML FlexTouch Pen Generic  drug: insulin degludec Inject 26 Units into the skin every evening. What changed: how much to take   zolpidem 5 MG tablet Commonly known as: AMBIEN Take 1 tablet (5 mg total) by mouth at bedtime as needed for sleep.        Allergies:  Allergies  Allergen Reactions   Sulfa Antibiotics Swelling    Mouth swelling   Atorvastatin Other (See Comments)    Muscle aches - tolerating Pravastatin 40 mg MWF   Jardiance [Empagliflozin] Other (See Comments)    FEELS SLUGGISH, TIRED   Lopressor [Metoprolol] Other (See Comments)    Fatigue   Rosuvastatin Other (See Comments)    Muscle aches - tolerating Pravastatin  40 mg MWF   Doxycycline Nausea Only   Temazepam Other (See Comments)    Made insomnia worse     Family History: Family History  Problem Relation Age of Onset   Hypertension Mother    Diabetes Father    Heart disease Father    Stroke Father    Hypertension Sister     Social History:  reports that he has been smoking cigarettes. He started smoking about 61 years ago. He has a 30.8 pack-year smoking history. He has never used smokeless tobacco. He reports that he does not drink alcohol and does not use drugs.  ROS: All other review of systems were reviewed and are negative except what is noted above in HPI  Physical Exam: BP 121/65   Pulse (!) 101   Ht 5\' 6"  (1.676 m)   Wt 143 lb (64.9 kg)   BMI 23.08 kg/m   Constitutional:  Alert and oriented, No acute distress. HEENT: [place] AT, moist mucus membranes.  Trachea midline, no masses. Cardiovascular: No clubbing, cyanosis, or edema. Respiratory: Normal respiratory effort, no increased work of breathing. GI: Abdomen is soft, nontender, nondistended, no abdominal masses GU: No CVA tenderness.  Lymph: No cervical or inguinal lymphadenopathy. Skin: No rashes, bruises or suspicious lesions. Neurologic: Grossly intact, no focal deficits, moving all 4 extremities. Psychiatric: Normal mood and affect.  Laboratory Data: Lab  Results  Component Value Date   WBC 9.1 10/07/2022   HGB 13.7 10/07/2022   HCT 40.9 10/07/2022   MCV 97 10/07/2022   PLT 322 10/07/2022    Lab Results  Component Value Date   CREATININE 0.97 10/07/2022    No results found for: "PSA"  No results found for: "TESTOSTERONE"  Lab Results  Component Value Date   HGBA1C 8.2 (H) 09/15/2022    Urinalysis    Component Value Date/Time   COLORURINE STRAW (A) 08/14/2020 1122   APPEARANCEUR Clear 10/28/2021 0959   LABSPEC 1.008 08/14/2020 1122   PHURINE 7.0 08/14/2020 1122   GLUCOSEU Negative 10/28/2021 0959   HGBUR NEGATIVE 08/14/2020 1122   BILIRUBINUR Negative 10/28/2021 0959   KETONESUR NEGATIVE 08/14/2020 1122   PROTEINUR Negative 10/28/2021 0959   PROTEINUR NEGATIVE 08/14/2020 1122   NITRITE Negative 10/28/2021 0959   NITRITE NEGATIVE 08/14/2020 1122   LEUKOCYTESUR Negative 10/28/2021 0959   LEUKOCYTESUR NEGATIVE 08/14/2020 1122    Lab Results  Component Value Date   LABMICR Comment 10/28/2021   WBCUA None seen 10/07/2021   LABEPIT None seen 10/07/2021   MUCUS Present 07/27/2020   BACTERIA None seen 10/07/2021    Pertinent Imaging: *** Results for orders placed in visit on 10/07/22  DG Abd 1 View  Narrative CLINICAL DATA:  Right lower quadrant pain, constipation.  EXAM: ABDOMEN - 1 VIEW  COMPARISON:  KUB 08/14/2020  FINDINGS: There is a nonobstructive bowel gas pattern. There is no evidence of free intraperitoneal air. There is no abnormal soft tissue calcification. The imaged lung bases are clear. There is no acute osseous abnormality.  IMPRESSION: No acute finding in the abdomen.   Electronically Signed By: Lesia Hausen M.D. On: 10/07/2022 15:00  No results found for this or any previous visit.  No results found for this or any previous visit.  No results found for this or any previous visit.  No results found for this or any previous visit.  No valid procedures specified. No results  found for this or any previous visit.  No results found for this or any previous  visit.   Assessment & Plan:    1. BPH with urinary obstruction *** - Urinalysis, Routine w reflex microscopic  2. Urinary retention ***   No follow-ups on file.  Wilkie Aye, MD  [facility] [place]

## 2022-11-03 ENCOUNTER — Encounter: Payer: Self-pay | Admitting: Family Medicine

## 2022-11-03 ENCOUNTER — Ambulatory Visit (INDEPENDENT_AMBULATORY_CARE_PROVIDER_SITE_OTHER): Payer: Medicare Other | Admitting: Family Medicine

## 2022-11-03 ENCOUNTER — Ambulatory Visit (INDEPENDENT_AMBULATORY_CARE_PROVIDER_SITE_OTHER): Payer: Medicare Other

## 2022-11-03 VITALS — BP 114/68 | HR 100 | Temp 97.8°F | Ht 66.0 in | Wt 148.5 lb

## 2022-11-03 DIAGNOSIS — M25551 Pain in right hip: Secondary | ICD-10-CM

## 2022-11-03 DIAGNOSIS — M47816 Spondylosis without myelopathy or radiculopathy, lumbar region: Secondary | ICD-10-CM | POA: Diagnosis not present

## 2022-11-03 DIAGNOSIS — M25552 Pain in left hip: Secondary | ICD-10-CM | POA: Diagnosis not present

## 2022-11-03 MED ORDER — TIZANIDINE HCL 4 MG PO TABS
4.0000 mg | ORAL_TABLET | Freq: Three times a day (TID) | ORAL | 0 refills | Status: DC | PRN
Start: 2022-11-03 — End: 2022-11-20

## 2022-11-03 NOTE — Progress Notes (Signed)
   Acute Office Visit  Subjective:     Patient ID: John [name], male    DOB: 1945/08/06, 77 y.o.   MRN: [id number]  Chief Complaint  Patient presents with   Hip Pain    Hip Pain  There was no injury mechanism. The pain is present in the right hip, left hip, right thigh and left thigh. The quality of the pain is described as shooting. The pain has been Intermittent since onset. Associated symptoms include numbness (feet, chronic, bilaterall) and tingling (bilateral feet, chronic). Pertinent negatives include no inability to bear weight, loss of motion, loss of sensation or muscle weakness. The symptoms are aggravated by movement. He has tried acetaminophen for the symptoms. The treatment provided mild relief.   Denies changes in bowel or bladder control. No fever. He is having muscle spasms.  Review of Systems  Neurological:  Positive for tingling (bilateral feet, chronic) and numbness (feet, chronic, bilaterall).        Objective:    BP 114/68   Pulse 100   Temp 97.8 F (36.6 C) (Temporal)   Ht 5\' 6"  (1.676 m)   Wt 148 lb 8 oz (67.4 kg)   SpO2 96%   BMI 23.97 kg/m    Physical Exam Vitals and nursing note reviewed.  Constitutional:      General: He is not in acute distress.    Appearance: He is not ill-appearing, toxic-appearing or diaphoretic.  Pulmonary:     Effort: Pulmonary effort is normal. No respiratory distress.  Musculoskeletal:     Lumbar back: No swelling, edema, tenderness or bony tenderness. Negative right straight leg raise test and negative left straight leg raise test.     Right hip: Deformity and tenderness present. No bony tenderness or crepitus.     Left hip: Tenderness present. No deformity, bony tenderness or crepitus.  Neurological:     Mental Status: He is alert and oriented to person, place, and time. Mental status is at baseline.     Gait: Gait abnormal (antalgic, using cane).  Psychiatric:        Mood and Affect: Mood normal.         Behavior: Behavior normal.     No results found for any visits on 11/03/22.      Assessment & Plan:   Ben was seen today for hip pain.  Diagnoses and all orders for this visit:  Bilateral hip pain Bilateral xrays pending. Zanaflex prn for spasms. Continue tylenol prn, NSAIDs contraindicated. Will call patient with xray report and plan of care when available.  -     tiZANidine (ZANAFLEX) 4 MG tablet; Take 1 tablet (4 mg total) by mouth every 8 (eight) hours as needed for muscle spasms. -     DG Hip Unilat W OR W/O Pelvis 2-3 Views Left; Future -     DG Hip Unilat W OR W/O Pelvis 2-3 Views Right; Future  Return if symptoms worsen or fail to improve.  The patient indicates understanding of these issues and agrees with the plan.  Gabriel Earing, FNP

## 2022-11-03 NOTE — Patient Instructions (Signed)
Hip Pain The hip is the joint between the upper legs and the lower pelvis. The bones, cartilage, tendons, and muscles of your hip joint support your body and allow you to move around. Hip pain can range from a minor ache to severe pain in one or both of your hips. The pain may be felt on the inside of the hip joint near the groin, or on the outside near the buttocks and upper thigh. You may also have swelling or stiffness in your hip area. Follow these instructions at home: Managing pain, stiffness, and swelling     If told, put ice on the painful area. Put ice in a plastic bag. Place a towel between your skin and the bag. Leave the ice on for 20 minutes, 2-3 times a day. If told, apply heat to the affected area as often as told by your health care provider. Use the heat source that your provider recommends, such as a moist heat pack or a heating pad. Place a towel between your skin and the heat source. Leave the heat on for 20-30 minutes. If your skin turns bright red, remove the ice or heat right away to prevent skin damage. The risk of damage is higher if you cannot feel pain, heat, or cold. Activity Do exercises as told by your provider. Avoid activities that cause pain. General instructions  Take over-the-counter and prescription medicines only as told by your provider. Keep a journal of your symptoms. Write down: How often you have hip pain. The location of your pain. What the pain feels like. What makes the pain worse. Sleep with a pillow between your legs on your most comfortable side. Keep all follow-up visits. Your provider will monitor your pain and activity. Contact a health care provider if: You cannot put weight on your leg. Your pain or swelling gets worse after a week. It gets harder to walk. You have a fever. Get help right away if: You fall. You have a sudden increase in pain and swelling in your hip. Your hip is red or swollen or very tender to touch. This  information is not intended to replace advice given to you by your health care provider. Make sure you discuss any questions you have with your health care provider. Document Revised: 11/26/2021 Document Reviewed: 11/26/2021 Elsevier Patient Education  2024 Elsevier Inc.  

## 2022-11-04 ENCOUNTER — Encounter: Payer: Self-pay | Admitting: Urology

## 2022-11-04 NOTE — Patient Instructions (Signed)

## 2022-11-05 ENCOUNTER — Telehealth: Payer: Self-pay | Admitting: Cardiovascular Disease

## 2022-11-05 NOTE — Telephone Encounter (Signed)
Pt calling and stating his heart is high. He gets SOB when walking due to COPD was nauseated earlier but not now. Asked pt to take is BP as he has not done so today. Bp is 126/61 and HR is 126. Walking makes his HR go up. No headache or dizziness. He states his Metoprolol is "dragging him down". Please advise

## 2022-11-05 NOTE — Telephone Encounter (Signed)
STAT if HR is under 50 or over 120 (normal HR is 60-100 beats per minute)  What is your heart rate? Over 100  Do you have a log of your heart rate readings (document readings)? No   Do you have any other symptoms? SOB, nauseated

## 2022-11-05 NOTE — Telephone Encounter (Signed)
Called pt back with appt day and time. Pt verbalized understanding.

## 2022-11-06 ENCOUNTER — Ambulatory Visit (INDEPENDENT_AMBULATORY_CARE_PROVIDER_SITE_OTHER): Payer: Medicare Other

## 2022-11-06 DIAGNOSIS — I483 Typical atrial flutter: Secondary | ICD-10-CM | POA: Diagnosis not present

## 2022-11-06 LAB — CUP PACEART REMOTE DEVICE CHECK
Date Time Interrogation Session: 20240801010100
Implantable Pulse Generator Implant Date: 20240126
Pulse Gen Serial Number: 104324

## 2022-11-07 ENCOUNTER — Telehealth: Payer: Self-pay | Admitting: Family

## 2022-11-07 DIAGNOSIS — M160 Bilateral primary osteoarthritis of hip: Secondary | ICD-10-CM

## 2022-11-07 MED ORDER — DICLOFENAC SODIUM 75 MG PO TBEC
75.0000 mg | DELAYED_RELEASE_TABLET | Freq: Two times a day (BID) | ORAL | 0 refills | Status: DC
Start: 2022-11-07 — End: 2022-11-14

## 2022-11-07 MED ORDER — DICLOFENAC SODIUM 1 % EX GEL
2.0000 g | Freq: Four times a day (QID) | CUTANEOUS | 1 refills | Status: AC
Start: 2022-11-07 — End: ?

## 2022-11-07 NOTE — Telephone Encounter (Signed)
Called and spoke with patient. Diclofenac BID and gel Prescription sent to pharmacy. No other NSAIDs

## 2022-11-11 ENCOUNTER — Ambulatory Visit (INDEPENDENT_AMBULATORY_CARE_PROVIDER_SITE_OTHER): Payer: Medicare Other | Admitting: Family

## 2022-11-11 ENCOUNTER — Encounter: Payer: Self-pay | Admitting: Family

## 2022-11-11 VITALS — BP 137/96 | HR 112 | Temp 98.2°F | Ht 66.0 in | Wt 148.4 lb

## 2022-11-11 DIAGNOSIS — M25552 Pain in left hip: Secondary | ICD-10-CM | POA: Diagnosis not present

## 2022-11-11 DIAGNOSIS — E1142 Type 2 diabetes mellitus with diabetic polyneuropathy: Secondary | ICD-10-CM

## 2022-11-11 DIAGNOSIS — I4891 Unspecified atrial fibrillation: Secondary | ICD-10-CM | POA: Diagnosis not present

## 2022-11-11 DIAGNOSIS — J449 Chronic obstructive pulmonary disease, unspecified: Secondary | ICD-10-CM | POA: Diagnosis not present

## 2022-11-11 DIAGNOSIS — I5033 Acute on chronic diastolic (congestive) heart failure: Secondary | ICD-10-CM | POA: Diagnosis not present

## 2022-11-11 DIAGNOSIS — E1151 Type 2 diabetes mellitus with diabetic peripheral angiopathy without gangrene: Secondary | ICD-10-CM | POA: Diagnosis not present

## 2022-11-11 DIAGNOSIS — I110 Hypertensive heart disease with heart failure: Secondary | ICD-10-CM | POA: Diagnosis not present

## 2022-11-11 DIAGNOSIS — M25551 Pain in right hip: Secondary | ICD-10-CM | POA: Diagnosis not present

## 2022-11-11 MED ORDER — TRAMADOL HCL 50 MG PO TABS
50.0000 mg | ORAL_TABLET | Freq: Three times a day (TID) | ORAL | Status: DC | PRN
Start: 2022-11-11 — End: 2022-11-13

## 2022-11-11 MED ORDER — DAPAGLIFLOZIN PROPANEDIOL 5 MG PO TABS
5.0000 mg | ORAL_TABLET | Freq: Every day | ORAL | 1 refills | Status: DC
Start: 2022-11-11 — End: 2022-12-29

## 2022-11-11 MED ORDER — KETOROLAC TROMETHAMINE 60 MG/2ML IM SOLN
60.0000 mg | Freq: Once | INTRAMUSCULAR | Status: AC
Start: 2022-11-11 — End: 2022-11-11
  Administered 2022-11-11: 60 mg via INTRAMUSCULAR

## 2022-11-11 NOTE — Patient Instructions (Signed)
Hip Pain The hip is the joint between the upper legs and the lower pelvis. The bones, cartilage, tendons, and muscles of your hip joint support your body and allow you to move around. Hip pain can range from a minor ache to severe pain in one or both of your hips. The pain may be felt on the inside of the hip joint near the groin, or on the outside near the buttocks and upper thigh. You may also have swelling or stiffness in your hip area. Follow these instructions at home: Managing pain, stiffness, and swelling     If told, put ice on the painful area. Put ice in a plastic bag. Place a towel between your skin and the bag. Leave the ice on for 20 minutes, 2-3 times a day. If told, apply heat to the affected area as often as told by your health care provider. Use the heat source that your provider recommends, such as a moist heat pack or a heating pad. Place a towel between your skin and the heat source. Leave the heat on for 20-30 minutes. If your skin turns bright red, remove the ice or heat right away to prevent skin damage. The risk of damage is higher if you cannot feel pain, heat, or cold. Activity Do exercises as told by your provider. Avoid activities that cause pain. General instructions  Take over-the-counter and prescription medicines only as told by your provider. Keep a journal of your symptoms. Write down: How often you have hip pain. The location of your pain. What the pain feels like. What makes the pain worse. Sleep with a pillow between your legs on your most comfortable side. Keep all follow-up visits. Your provider will monitor your pain and activity. Contact a health care provider if: You cannot put weight on your leg. Your pain or swelling gets worse after a week. It gets harder to walk. You have a fever. Get help right away if: You fall. You have a sudden increase in pain and swelling in your hip. Your hip is red or swollen or very tender to touch. This  information is not intended to replace advice given to you by your health care provider. Make sure you discuss any questions you have with your health care provider. Document Revised: 11/26/2021 Document Reviewed: 11/26/2021 Elsevier Patient Education  2024 Elsevier Inc.  

## 2022-11-11 NOTE — Progress Notes (Signed)
Subjective:    Patient ID: John [name], male    DOB: [birth year], 77 y.o.   MRN: [id number]  Chief Complaint  Patient presents with   Follow-up   Pt presents to the office today with bilateral hip pain that radiates down his leg. He had an x-ray that showed, " advanced degenerative changes at the hips."  He was given diclofenac 75 mg BID and diclofenac gel with no relief.   Complaining of being [facility] and wants referral to Audiologists.  Hip Pain  The incident occurred more than 1 week ago. There was no injury mechanism. The pain is present in the left hip and right hip. The quality of the pain is described as aching. The pain is at a severity of 10/10. The pain is severe. The pain has been Constant since onset. He reports no foreign bodies present. The symptoms are aggravated by weight bearing and movement. He has tried NSAIDs, rest and non-weight bearing for the symptoms. The treatment provided mild relief.  Diabetes He presents for his follow-up diabetic visit. He has type 2 diabetes mellitus. Associated symptoms include foot paresthesias. Pertinent negatives for diabetes include no blurred vision. Symptoms are stable. Diabetic complications include peripheral neuropathy. Risk factors for coronary artery disease include dyslipidemia, diabetes mellitus, hypertension and sedentary lifestyle. His overall blood glucose range is >200 mg/dl.      Review of Systems  Eyes:  Negative for blurred vision.  All other systems reviewed and are negative.      Objective:   Physical Exam Vitals reviewed.  Constitutional:      General: He is not in acute distress.    Appearance: He is well-developed. He is obese.  HENT:     Head: Normocephalic.  Eyes:     General:        Right eye: No discharge.        Left eye: No discharge.     Pupils: Pupils are equal, round, and reactive to light.  Neck:     Thyroid: No thyromegaly.  Cardiovascular:     Rate and Rhythm: Normal rate and regular  rhythm.     Heart sounds: Normal heart sounds. No murmur heard. Pulmonary:     Effort: Pulmonary effort is normal. No respiratory distress.     Breath sounds: Normal breath sounds. No wheezing.  Abdominal:     General: Bowel sounds are normal. There is no distension.     Palpations: Abdomen is soft.     Tenderness: There is no abdominal tenderness.  Musculoskeletal:        General: Tenderness present.     Cervical back: Normal range of motion and neck supple.     Comments: Pain in bilateral hips with flexion and internal rotation  Skin:    General: Skin is warm and dry.     Findings: No erythema or rash.  Neurological:     Mental Status: He is alert and oriented to person, place, and time.     Cranial Nerves: No cranial nerve deficit.     Motor: Weakness present.     Gait: Gait abnormal.     Deep Tendon Reflexes: Reflexes are normal and symmetric.  Psychiatric:        Behavior: Behavior normal.        Thought Content: Thought content normal.        Judgment: Judgment normal.     BP (!) 137/96   Pulse (!) 112   Temp 98.2 F (36.8 C) (  Temporal)   Ht 5\' 6"  (1.676 m)   Wt 148 lb 6.4 oz (67.3 kg)   SpO2 97%   BMI 23.95 kg/m        Assessment & Plan:   John [name] comes in today with chief complaint of Follow-up   Diagnosis and orders addressed:  1. Bilateral hip pain Continue diclofenac BID with food  No other NSAID's  Ultram as needed- sedation precautions  - traMADol (ULTRAM) 50 MG tablet; Take 1-2 tablets (50-100 mg total) by mouth every 8 (eight) hours as needed for up to 5 days.  Dispense: 60 tablet; Refill: 02 - ketorolac (TORADOL) injection 60 mg  2. Type 2 diabetes mellitus with diabetic polyneuropathy, without long-term current use of insulin (HCC) Start Farxiga 5 mg  Strict low carb diet  - dapagliflozin propanediol (FARXIGA) 5 MG TABS tablet; Take 1 tablet (5 mg total) by mouth daily before breakfast.  Dispense: 90 tablet; Refill: 1   Follow up  plan: Keep chronic follow up  Jannifer Rodney, FNP

## 2022-11-12 DIAGNOSIS — I6521 Occlusion and stenosis of right carotid artery: Secondary | ICD-10-CM | POA: Diagnosis not present

## 2022-11-12 DIAGNOSIS — Z87891 Personal history of nicotine dependence: Secondary | ICD-10-CM | POA: Diagnosis not present

## 2022-11-12 DIAGNOSIS — I4891 Unspecified atrial fibrillation: Secondary | ICD-10-CM | POA: Diagnosis not present

## 2022-11-12 DIAGNOSIS — Z7951 Long term (current) use of inhaled steroids: Secondary | ICD-10-CM | POA: Diagnosis not present

## 2022-11-12 DIAGNOSIS — G4700 Insomnia, unspecified: Secondary | ICD-10-CM | POA: Diagnosis not present

## 2022-11-12 DIAGNOSIS — E039 Hypothyroidism, unspecified: Secondary | ICD-10-CM | POA: Diagnosis not present

## 2022-11-12 DIAGNOSIS — Z7982 Long term (current) use of aspirin: Secondary | ICD-10-CM | POA: Diagnosis not present

## 2022-11-12 DIAGNOSIS — J449 Chronic obstructive pulmonary disease, unspecified: Secondary | ICD-10-CM | POA: Diagnosis not present

## 2022-11-12 DIAGNOSIS — J9611 Chronic respiratory failure with hypoxia: Secondary | ICD-10-CM | POA: Diagnosis not present

## 2022-11-12 DIAGNOSIS — I4892 Unspecified atrial flutter: Secondary | ICD-10-CM | POA: Diagnosis not present

## 2022-11-12 DIAGNOSIS — I5033 Acute on chronic diastolic (congestive) heart failure: Secondary | ICD-10-CM | POA: Diagnosis not present

## 2022-11-12 DIAGNOSIS — R531 Weakness: Secondary | ICD-10-CM | POA: Diagnosis not present

## 2022-11-12 DIAGNOSIS — N401 Enlarged prostate with lower urinary tract symptoms: Secondary | ICD-10-CM | POA: Diagnosis not present

## 2022-11-12 DIAGNOSIS — K5900 Constipation, unspecified: Secondary | ICD-10-CM | POA: Diagnosis not present

## 2022-11-12 DIAGNOSIS — Z556 Problems related to health literacy: Secondary | ICD-10-CM | POA: Diagnosis not present

## 2022-11-12 DIAGNOSIS — E1142 Type 2 diabetes mellitus with diabetic polyneuropathy: Secondary | ICD-10-CM | POA: Diagnosis not present

## 2022-11-12 DIAGNOSIS — I110 Hypertensive heart disease with heart failure: Secondary | ICD-10-CM | POA: Diagnosis not present

## 2022-11-12 DIAGNOSIS — E1151 Type 2 diabetes mellitus with diabetic peripheral angiopathy without gangrene: Secondary | ICD-10-CM | POA: Diagnosis not present

## 2022-11-12 DIAGNOSIS — K219 Gastro-esophageal reflux disease without esophagitis: Secondary | ICD-10-CM | POA: Diagnosis not present

## 2022-11-12 DIAGNOSIS — K253 Acute gastric ulcer without hemorrhage or perforation: Secondary | ICD-10-CM | POA: Diagnosis not present

## 2022-11-12 DIAGNOSIS — Z9181 History of falling: Secondary | ICD-10-CM | POA: Diagnosis not present

## 2022-11-12 DIAGNOSIS — D500 Iron deficiency anemia secondary to blood loss (chronic): Secondary | ICD-10-CM | POA: Diagnosis not present

## 2022-11-12 DIAGNOSIS — R338 Other retention of urine: Secondary | ICD-10-CM | POA: Diagnosis not present

## 2022-11-12 DIAGNOSIS — E876 Hypokalemia: Secondary | ICD-10-CM | POA: Diagnosis not present

## 2022-11-13 ENCOUNTER — Telehealth: Payer: Self-pay | Admitting: Family

## 2022-11-13 ENCOUNTER — Other Ambulatory Visit: Payer: Self-pay | Admitting: Family Medicine

## 2022-11-13 DIAGNOSIS — E1142 Type 2 diabetes mellitus with diabetic polyneuropathy: Secondary | ICD-10-CM

## 2022-11-13 MED ORDER — HYDROXYZINE PAMOATE 25 MG PO CAPS: 25.0000 mg | ORAL_CAPSULE | Freq: Three times a day (TID) | ORAL | 0 refills | Status: DC | PRN

## 2022-11-13 NOTE — Telephone Encounter (Signed)
Patient aware and verbalized understanding. °

## 2022-11-13 NOTE — Telephone Encounter (Signed)
Called and spoke with patient he is itching all over with no rash. Patient took a benadryl and it has not helped can something be called in to help with the itching or a new med for the pain? Sugar was 84 this morning.

## 2022-11-13 NOTE — Telephone Encounter (Signed)
Stop Ultram, added to allergy list. Vistaril Prescription sent to pharmacy to help with itching.   Jannifer Rodney, FNP

## 2022-11-14 ENCOUNTER — Telehealth: Payer: Self-pay | Admitting: Family

## 2022-11-14 MED ORDER — CELECOXIB 200 MG PO CAPS: 200.0000 mg | ORAL_CAPSULE | Freq: Every day | ORAL | 0 refills | Status: DC

## 2022-11-14 MED ORDER — PEN NEEDLES 32G X 4 MM MISC
1.0000 | Freq: Three times a day (TID) | 1 refills | Status: DC
Start: 2022-11-14 — End: 2022-12-29

## 2022-11-14 MED ORDER — TRESIBA FLEXTOUCH 200 UNIT/ML ~~LOC~~ SOPN
18.0000 [IU] | PEN_INJECTOR | Freq: Every evening | SUBCUTANEOUS | 1 refills | Status: DC
Start: 2022-11-14 — End: 2022-12-29

## 2022-11-14 NOTE — Addendum Note (Signed)
Addended by: Jannifer Rodney A on: 11/14/2022 02:56 PM   Modules accepted: Orders

## 2022-11-14 NOTE — Telephone Encounter (Signed)
Diclofenac changed to celebrex. No other NSAID"s.   Jannifer Rodney, FNP

## 2022-11-14 NOTE — Telephone Encounter (Signed)
Prescription sent to pharmacy.

## 2022-11-14 NOTE — Addendum Note (Signed)
Addended by: Ignacia Bayley on: 11/14/2022 01:48 PM   Modules accepted: Orders

## 2022-11-14 NOTE — Telephone Encounter (Signed)
Patient aware needs refill sent for insuline

## 2022-11-14 NOTE — Addendum Note (Signed)
Addended by: Jannifer Rodney A on: 11/14/2022 11:45 AM   Modules accepted: Orders

## 2022-11-14 NOTE — Telephone Encounter (Signed)
Pt returning call from nurse. Tried giving him Christys message but he still wants to talk to [place].

## 2022-11-14 NOTE — Telephone Encounter (Signed)
Pt wants to talk to Alexander C. Pt wants something for pain. I told pt he would need an apt for that and he said he did not because he was just seen. He said if he needed anything to call. Please call back

## 2022-11-14 NOTE — Telephone Encounter (Deleted)
Pt returning call from nurse. Tried giving him Christys message but he still wants to talk to [place].

## 2022-11-15 NOTE — Progress Notes (Unsigned)
Cardiology Clinic Note   Patient Name: John [name] Date of Encounter: 11/17/2022  Primary Care Provider:  Junie Spencer, FNP Primary Cardiologist:  Reatha Harps, MD  Patient Profile    77 year old male with history of HFpEF, atrial flutter, status post ablation 01/13/2022, CHA2DS2-VASc score of 5, hyperlipidemia PAD, right femoral endarterectomy 08/20/2020 and right common iliac artery stent 08/10/2020, COPD, non-small cell lung CA status post chemoradiation in 2018, and GI bleed.    Last seen by Dr. Flora Lipps on 03/20/2022 stable from cardiac standpoint.  Was referred to EP to be considered for Watchman device.  ILR was placed per Dr. Lalla Brothers.  Eliquis was discontinued and he was started on aspirin 81 mg daily.  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   Anemia    Anxiety    Arthritis    Asthma    Atrial flutter (HCC)    BPH (benign prostatic hyperplasia)    with urinary retention 02/06/20   CHF (congestive heart failure) (HCC)    COPD (chronic obstructive pulmonary disease) (HCC)    Depression    Diabetes mellitus, type II (HCC)    Dyspnea    History of kidney stones    History of radiation therapy    right lung 09/10/2020-09/17/2020   Dr Roselind Messier   History of radiation therapy    Left and right Lung- 06/19/22-06/26/22   Hyperlipidemia    Hypertension    Hypothyroidism    Macular degeneration    Neuropathy    Non-small cell lung cancer, right (HCC) 04/18/2016   Peripheral vascular disease (HCC)    Prostatitis    Pulmonary nodule, left 07/16/2016   Sleep apnea    cpap   Past Surgical History:  Procedure Laterality Date   A-FLUTTER ABLATION N/A 01/13/2022   Procedure: A-FLUTTER ABLATION;  Surgeon: Lanier Prude, MD;  Location: [facility];  Service: Cardiovascular;  Laterality: N/A;   ABDOMINAL AORTOGRAM W/LOWER EXTREMITY Left 02/06/2020   Procedure: ABDOMINAL AORTOGRAM W/LOWER  EXTREMITY;  Surgeon: Runell Gess, MD;  Location: [facility];  Service: Cardiovascular;  Laterality: Left;   ABDOMINAL AORTOGRAM W/LOWER EXTREMITY N/A 05/30/2021   Procedure: ABDOMINAL AORTOGRAM W/LOWER EXTREMITY;  Surgeon: Cephus Shelling, MD;  Location: [facility];  Service: Cardiovascular;  Laterality: N/A;   BIOPSY  09/16/2021   Procedure: BIOPSY;  Surgeon: Iva Boop, MD;  Location: [facility] ENDOSCOPY;  Service: Gastroenterology;;   CARDIOVERSION N/A 10/05/2020   Procedure: CARDIOVERSION;  Surgeon: Sande Rives, MD;  Location: [facility] ENDOSCOPY;  Service: Cardiovascular;  Laterality: N/A;   CATARACT EXTRACTION, BILATERAL Bilateral    COLONOSCOPY WITH PROPOFOL N/A 06/20/2021   Procedure: COLONOSCOPY WITH PROPOFOL;  Surgeon: Hilarie Fredrickson, MD;  Location: [facility] ENDOSCOPY;  Service: Endoscopy;  Laterality: N/A;   COLONOSCOPY WITH PROPOFOL N/A 09/16/2021   Procedure: COLONOSCOPY WITH PROPOFOL;  Surgeon: Iva Boop, MD;  Location: [facility] ENDOSCOPY;  Service: Gastroenterology;  Laterality: N/A;   ENDARTERECTOMY FEMORAL Right 08/20/2020   Procedure: ENDARTERECTOMY  RIGHT FEMORAL ARTERY;  Surgeon: Cephus Shelling, MD;  Location: [facility] OR;  Service: Vascular;  Laterality: Right;   ENTEROSCOPY N/A 10/11/2021   Procedure: ENTEROSCOPY;  Surgeon: Dolores Frame, MD;  Location: [facility];  Service: Gastroenterology;  Laterality: N/A;   ESOPHAGOGASTRODUODENOSCOPY N/A 09/16/2021   Procedure: ESOPHAGOGASTRODUODENOSCOPY (EGD);  Surgeon: Iva Boop, MD;  Location: [facility] ENDOSCOPY;  Service: Gastroenterology;  Laterality: N/A;   ESOPHAGOGASTRODUODENOSCOPY (EGD) WITH PROPOFOL N/A 09/06/2020   Procedure: ESOPHAGOGASTRODUODENOSCOPY (EGD) WITH PROPOFOL;  Surgeon: Hilarie Fredrickson, MD;  Location: [facility] ENDOSCOPY;  Service: Endoscopy;  Laterality: N/A;   ESOPHAGOGASTRODUODENOSCOPY (EGD) WITH PROPOFOL N/A 10/11/2021   Procedure: ESOPHAGOGASTRODUODENOSCOPY (EGD) WITH PROPOFOL;  Surgeon:  Dolores Frame, MD;  Location: [facility];  Service: Gastroenterology;  Laterality: N/A;   HEMOSTASIS CLIP PLACEMENT  09/16/2021   Procedure: HEMOSTASIS CLIP PLACEMENT;  Surgeon: Iva Boop, MD;  Location: [facility] ENDOSCOPY;  Service: Gastroenterology;;   HOT HEMOSTASIS N/A 09/16/2021   Procedure: HOT HEMOSTASIS (ARGON PLASMA COAGULATION/BICAP);  Surgeon: Iva Boop, MD;  Location: [facility] ENDOSCOPY;  Service: Gastroenterology;  Laterality: N/A;   HOT HEMOSTASIS  10/11/2021   Procedure: HOT HEMOSTASIS (ARGON PLASMA COAGULATION/BICAP);  Surgeon: Marguerita Merles, Reuel Boom, MD;  Location: [facility];  Service: Gastroenterology;;   INSERTION OF ILIAC STENT Right 08/20/2020   Procedure: RETROGRADE INSERTION OF RIGHT ILIAC STENT;  Surgeon: Cephus Shelling, MD;  Location: [facility] OR;  Service: Vascular;  Laterality: Right;   INTRAOPERATIVE ARTERIOGRAM Right 08/20/2020   Procedure: INTRA OPERATIVE ARTERIOGRAM ILIAC;  Surgeon: Cephus Shelling, MD;  Location: [facility] OR;  Service: Vascular;  Laterality: Right;   PATCH ANGIOPLASTY Right 08/20/2020   Procedure: PATCH ANGIOPLASTY RIGHT FEMORAL ARTERY;  Surgeon: Cephus Shelling, MD;  Location: [facility] OR;  Service: Vascular;  Laterality: Right;   PERIPHERAL VASCULAR BALLOON ANGIOPLASTY Right 05/30/2021   Procedure: PERIPHERAL VASCULAR BALLOON ANGIOPLASTY;  Surgeon: Cephus Shelling, MD;  [facility];  Service: Cardiovascular;  Laterality: Right;   PORTACATH PLACEMENT Left 06/13/2016   Procedure: INSERTION PORT-A-CATH;  Surgeon: Franky Macho, MD;  Location: [facility] ORS;  Service: General;  Laterality: Left;   TRANSURETHRAL RESECTION OF PROSTATE N/A 05/31/2020   Procedure: TRANSURETHRAL RESECTION OF THE PROSTATE (TURP);  Surgeon: Malen Gauze, MD;  Location: [facility] ORS;  Service: Urology;  Laterality: N/A;   VIDEO BRONCHOSCOPY WITH ENDOBRONCHIAL NAVIGATION N/A 05/28/2016   Procedure: VIDEO BRONCHOSCOPY WITH ENDOBRONCHIAL NAVIGATION;   Surgeon: Loreli Slot, MD;  Location: [facility] OR;  Service: Thoracic;  Laterality: N/A;   VIDEO BRONCHOSCOPY WITH ENDOBRONCHIAL ULTRASOUND N/A 05/28/2016   Procedure: VIDEO BRONCHOSCOPY WITH ENDOBRONCHIAL ULTRASOUND;  Surgeon: Loreli Slot, MD;  Location: [facility] OR;  Service: Thoracic;  Laterality: N/A;    Allergies  Allergies  Allergen Reactions   Sulfa Antibiotics Swelling    Mouth swelling   Atorvastatin Other (See Comments)    Muscle aches - tolerating Pravastatin 40 mg MWF   Jardiance [Empagliflozin] Other (See Comments)    FEELS SLUGGISH, TIRED   Lopressor [Metoprolol] Other (See Comments)    Fatigue   Rosuvastatin Other (See Comments)    Muscle aches - tolerating Pravastatin 40 mg MWF   Doxycycline Nausea Only   Tramadol Itching   Temazepam Other (See Comments)    Made insomnia worse     History of Present Illness    Mr. Chmura returns to the office today for ongoing assessment and management of HFpEF, history of atrial flutter status post ablation, and PAD.  ILR in situ per Dr. Lalla Brothers.Remote monitor revealed atrial tachycardia but not atrial fib.   He comes today with multiple complaints.  He has fallen several times and does have lacerations to his left knee, right elbow and right shoulder.  He states when he sitting to standing position he is very lightheaded and dizzy and often falls, or when he has been up walking  for short period of time he states that his right leg begins to shake sometimes and he loses his balance and he falls.  He also complains of lower back pain with bilateral radiculopathy to both hips and down his legs.  He has not seen PCP to report this.  He states he is getting a good bit of return from the torsemide as well as the metolazone 3 times a week, and torsemide 20 mg which she is taking daily, along with spironolactone.  He has stopped taking metoprolol as he stated that it made him too tired and dizzy.  He had been on 25 mg daily.  He  has refused physical therapy as he states he has no strength for it, dating that he also cannot stand long enough to be able to walk or make any progress without feeling lightheaded or dizzy or weak.  He is currently in a wheelchair where he is more comfortable using this for ambulation.   Home Medications    Current Outpatient Medications  Medication Sig Dispense Refill   allopurinol (ZYLOPRIM) 300 MG tablet Take 1 tablet (300 mg total) by mouth daily. 90 tablet 1   aspirin EC 81 MG tablet Take 1 tablet (81 mg total) by mouth daily. Swallow whole. 90 tablet 3   Blood Glucose Monitoring Suppl (ONETOUCH VERIO REFLECT) w/Device KIT Use to test blood sugars daily as directed. DX: E11.9 1 kit 1   celecoxib (CELEBREX) 200 MG capsule Take 1 capsule (200 mg total) by mouth daily. 90 capsule 0   colchicine 0.6 MG tablet Take 2 tabs immediately, then 1 tab twice per day for the duration of the flare up to a max of 7 days 21 tablet 0   Continuous Glucose Receiver (FREESTYLE LIBRE 3 READER) DEVI 1 Device by Does not apply route every 14 (fourteen) days. 1 each 3   Continuous Glucose Sensor (FREESTYLE LIBRE 3 SENSOR) MISC 1 Device by Does not apply route continuous. 1 each 2   dapagliflozin propanediol (FARXIGA) 5 MG TABS tablet Take 1 tablet (5 mg total) by mouth daily before breakfast. 90 tablet 1   dutasteride (AVODART) 0.5 MG capsule Take 1 capsule (0.5 mg total) by mouth daily. 90 capsule 3   ezetimibe (ZETIA) 10 MG tablet TAKE 1 TABLET BY MOUTH EVERY DAY 90 tablet 3   famotidine (PEPCID) 20 MG tablet TAKE 1 TABLET BY MOUTH DAILY AFTER SUPPER 90 tablet 3   fluticasone (FLONASE) 50 MCG/ACT nasal spray PLACE 1 SPRAY INTO BOTH NOSTRILS DAILY AS NEEDED FOR ALLERGIES OR RHINITIS. 48 mL 1   Fluticasone-Umeclidin-Vilant (TRELEGY ELLIPTA) 100-62.5-25 MCG/ACT AEPB Inhale 1 puff into the lungs daily. 1 each 11   gabapentin (NEURONTIN) 300 MG capsule TAKE 1 CAPSULE BY MOUTH THREE TIMES A DAY 270 capsule 0    hydrOXYzine (VISTARIL) 25 MG capsule Take 1 capsule (25 mg total) by mouth every 8 (eight) hours as needed. 30 capsule 0   insulin degludec (TRESIBA FLEXTOUCH) 200 UNIT/ML FlexTouch Pen Inject 18 Units into the skin every evening. 12 mL 1   Insulin Pen Needle (PEN NEEDLES) 32G X 4 MM MISC 1 Application by Does not apply route 3 (three) times daily. 200 each 1   levothyroxine (SYNTHROID) 50 MCG tablet TAKE 1 TABLET BY MOUTH EVERY DAY 90 tablet 1   linaclotide (LINZESS) 72 MCG capsule Take 1 capsule (72 mcg total) by mouth daily as needed (constipation).     magic mouthwash w/lidocaine SOLN Take 10 mLs by mouth  3 (three) times daily as needed for mouth pain. 500 mL 0   NON FORMULARY CPAP at bedtime     nystatin (MYCOSTATIN) 100000 UNIT/ML suspension Take 5 mLs (500,000 Units total) by mouth 4 (four) times daily. 473 mL 0   ondansetron (ZOFRAN) 4 MG tablet Take 1 tablet (4 mg total) by mouth every 8 (eight) hours as needed for nausea or vomiting. 20 tablet 0   OneTouch Delica Lancets 30G MISC Use to test blood sugars daily as directed. DX: E11.9 100 each 1   ONETOUCH VERIO test strip USE TO TEST BLOOD SUGARS DAILY AS DIRECTED. DX: E11.9 100 strip 3   OXYGEN Inhale 2 L into the lungs daily as needed (with Cpap at night).     pantoprazole (PROTONIX) 20 MG tablet TAKE 1 TABLET BY MOUTH EVERY DAY 90 tablet 0   polyethylene glycol powder (GLYCOLAX/MIRALAX) 17 GM/SCOOP powder Take 17 g by mouth 2 (two) times daily as needed. 3350 g 1   pravastatin (PRAVACHOL) 40 MG tablet TAKE 1 TABLET (40 MG TOTAL) BY MOUTH ON MONDAYS , WEDNESDAYS, AND FRIDAYS AS DIRECTED 48 tablet 1   tiZANidine (ZANAFLEX) 4 MG tablet Take 1 tablet (4 mg total) by mouth every 8 (eight) hours as needed for muscle spasms. 30 tablet 0   zolpidem (AMBIEN) 5 MG tablet Take 1 tablet (5 mg total) by mouth at bedtime as needed for sleep. 30 tablet 2   cetirizine (ZYRTEC ALLERGY) 10 MG tablet Take 1 tablet (10 mg total) by mouth daily. (Patient not  taking: Reported on 11/17/2022) 90 tablet 1   KLOR-CON M20 20 MEQ tablet TAKE 1 TABLET BY MOUTH EVERY DAY (Patient not taking: Reported on 11/17/2022) 90 tablet 0   metoprolol succinate (TOPROL XL) 25 MG 24 hr tablet Take 0.5 tablets (12.5 mg total) by mouth daily. (Patient not taking: Reported on 11/17/2022) 45 tablet 3   spironolactone (ALDACTONE) 25 MG tablet TAKE 1 TABLET (25 MG TOTAL) BY MOUTH DAILY. (Patient not taking: Reported on 11/17/2022) 90 tablet 3   torsemide (DEMADEX) 20 MG tablet Take 1 tablet (20 mg total) by mouth daily. (Patient not taking: Reported on 11/17/2022) 90 tablet 2   No current facility-administered medications for this visit.     Family History    Family History  Problem Relation Age of Onset   Hypertension Mother    Diabetes Father    Heart disease Father    Stroke Father    Hypertension Sister    He indicated that his mother is deceased. He indicated that his father is deceased. He indicated that his sister is alive. He indicated that both of his daughters are alive. He indicated that all of his three sons are alive.  Social History    Social History   Socioeconomic History   Marital status: Significant Other    Spouse name: Not on file   Number of children: 5   Years of education: 10   Highest education level: 10th grade  Occupational History   Occupation: Retired  Tobacco Use   Smoking status: Every Day    Current packs/day: 0.50    Average packs/day: 0.5 packs/day for 61.7 years (30.8 ttl pk-yrs)    Types: Cigarettes    Start date: 03/11/1961   Smokeless tobacco: Never   Tobacco comments:    1/2 pack to 1 pack per day 12/14/19  Vaping Use   Vaping status: Never Used  Substance and Sexual Activity   Alcohol use: No  Drug use: No   Sexual activity: Yes    Birth control/protection: None  Other Topics Concern   Not on file  Social History Narrative   [name] is his POA and lives with him. Children out of town - one in [facility], others in  [place].   Social Determinants of Health   Financial Resource Strain: Low Risk  (11/15/2021)   Overall Financial Resource Strain (CARDIA)    Difficulty of Paying Living Expenses: Not hard at all  Food Insecurity: No Food Insecurity (05/26/2022)   Hunger Vital Sign    Worried About Running Out of Food in the Last Year: Never true    Ran Out of Food in the Last Year: Never true  Transportation Needs: No Transportation Needs (05/26/2022)   PRAPARE - Administrator, Civil Service (Medical): No    Lack of Transportation (Non-Medical): No  Physical Activity: Inactive (11/15/2021)   Exercise Vital Sign    Days of Exercise per Week: 0 days    Minutes of Exercise per Session: 0 min  Stress: No Stress Concern Present (11/15/2021)   [employer] of Occupational Health - Occupational Stress Questionnaire    Feeling of Stress : Only a little  Social Connections: Moderately Isolated (11/15/2021)   Social Connection and Isolation Panel [NHANES]    Frequency of Communication with Friends and Family: More than three times a week    Frequency of Social Gatherings with Friends and Family: Once a week    Attends Religious Services: Never    Database administrator or Organizations: No    Attends Banker Meetings: Never    Marital Status: Living with partner  Intimate Partner Violence: Not At Risk (05/26/2022)   Humiliation, Afraid, Rape, and Kick questionnaire    Fear of Current or Ex-Partner: No    Emotionally Abused: No    Physically Abused: No    Sexually Abused: No     Review of Systems    General:  No chills, fever, night sweats or weight changes.  Frequent falls and dizziness Cardiovascular:  No chest pain, dyspnea on exertion, edema, orthopnea, positive for palpitations, since stopping metoprolol, no paroxysmal nocturnal dyspnea. Dermatological: No rash, lesions/masses Respiratory: No cough, dyspnea Urologic: No hematuria, dysuria Abdominal:   No nausea,  vomiting, diarrhea, bright red blood per rectum, melena, or hematemesis Neurologic:  No visual changes, wkns, changes in mental status.  Generalized muscle weakness and back pain with shooting pain to bilateral hips and down his legs  EKG Interpretation Date/Time:  Monday November 17 2022 11:00:31 EDT Ventricular Rate:  99 PR Interval:  220 QRS Duration:  104 QT Interval:  356 QTC Calculation: 456 R Axis:   23  Text Interpretation: Sinus rhythm with 1st degree A-V block Low voltage QRS Incomplete right bundle branch block Inferior infarct (cited on or before 13-Jan-2022) When compared with ECG of 13-Jan-2022 11:44, ST no longer depressed in Anterior leads Confirmed by Joni Reining [phone]) on 11/17/2022 12:15:37 PM    Physical Exam    VS:  BP 118/62 (BP Location: Left Arm, Patient Position: Sitting, Cuff Size: Normal)   Pulse (!) 102   Ht 5\' 6"  (1.676 m)   Wt 153 lb 3.2 oz (69.5 kg)   SpO2 96%   BMI 24.73 kg/m  , BMI Body mass index is 24.73 kg/m.     GEN: Well nourished, well developed, in no acute distress. HEENT: normal. Neck: Supple, no JVD, carotid bruits, or masses. Cardiac:  RRR, no murmurs, rubs, or gallops. No clubbing, cyanosis, edema.  Radials/DP/PT 2+ and equal bilaterally.  Respiratory:  Respirations regular and unlabored, clear to auscultation bilaterally. GI: Soft, nontender, nondistended, BS + x 4. MS: no deformity or atrophy.  Lacerations on left knee, right elbow and right shoulder. Skin: warm and dry, no rash. Neuro:  Strength and sensation are intact. Psych: Normal affect.  EKG Interpretation Date/Time:  Monday November 17 2022 11:00:31 EDT Ventricular Rate:  99 PR Interval:  220 QRS Duration:  104 QT Interval:  356 QTC Calculation: 456 R Axis:   23  Text Interpretation: Sinus rhythm with 1st degree A-V block Low voltage QRS Incomplete right bundle branch block Inferior infarct (cited on or before 13-Jan-2022) When compared with ECG of 13-Jan-2022 11:44,  ST no longer depressed in Anterior leads Confirmed by Joni Reining [phone]) on 11/17/2022 12:15:37 PM   Lab Results  Component Value Date   WBC 9.1 10/07/2022   HGB 13.7 10/07/2022   HCT 40.9 10/07/2022   MCV 97 10/07/2022   PLT 322 10/07/2022   Lab Results  Component Value Date   CREATININE 0.97 10/07/2022   BUN 30 (H) 10/07/2022   NA 135 10/07/2022   K 4.6 10/07/2022   CL 94 (L) 10/07/2022   CO2 26 10/07/2022   Lab Results  Component Value Date   ALT 20 10/07/2022   AST 20 10/07/2022   ALKPHOS 154 (H) 10/07/2022   BILITOT 0.2 10/07/2022   Lab Results  Component Value Date   CHOL 126 08/21/2020   HDL 28 (L) 08/21/2020   LDLCALC 80 08/21/2020   TRIG 89 08/21/2020   CHOLHDL 4.5 08/21/2020    Lab Results  Component Value Date   HGBA1C 8.2 (H) 09/15/2022     Review of Prior Studies EKG Interpretation Date/Time:  Monday November 17 2022 11:00:31 EDT Ventricular Rate:  99 PR Interval:  220 QRS Duration:  104 QT Interval:  356 QTC Calculation: 456 R Axis:   23  Text Interpretation: Sinus rhythm with 1st degree A-V block Low voltage QRS Incomplete right bundle branch block Inferior infarct (cited on or before 13-Jan-2022) When compared with ECG of 13-Jan-2022 11:44, ST no longer depressed in Anterior leads Confirmed by Joni Reining [phone]) on 11/17/2022 12:15:37 PM  ILR remote report ILR reviewed. 28 AT episodes. No AF. Mean HR in AT 110-120. Appointment in clinic scheduled for later this month. Continue remote monitoring.   Assessment & Plan   1.  Atrial tachycardia: Noted by Dr. Lalla Brothers on review of ILR remote check.  The patient is stopped taking metoprolol as he states it is made him feel very weak and dizzy and unable to walk without significant fatigue.  He has stopped this for the last 3 months.  He does not have a follow-up appointment with the EP.  We are making 1 for him for further recommendations concerning treatment of atrial tachycardia it would not  be contributing to the patient's symptoms.  2.  Orthostatic hypotension.  The patient is on torsemide, spironolactone, and metolazone 3 times a week.  I am going to stop the metolazone now.  Allowing him to have some higher blood pressures which may help with dizziness with standing or working with physical therapy and decrease the amount of falls.  Will see him again in a month to 6 weeks for further evaluation of his symptoms.  Hopefully the changes will not allow him to have decompensation of diastolic heart failure.  3.  Chronic diastolic heart failure.  Currently euvolemic.  The patient is down 40 pounds from his usual weight and continues to have issues with weight loss.  I am wondering if he is on more medicine than he needs currently as he is hypotensive and feeling tired.    4.  Diabetes: Followed by primary care provider Dr. Margo Aye.  The patient has a libre monitor on his left bicep, he has not eaten breakfast today and his blood glucose was 136 when he checked it in the office.  5.  Deconditioning: I have encouraged him to work with physical therapy for strengthening.  I am hoping maybe we can wean some of his medications to prevent orthostatic symptoms and frequent falls without causing him to have decompensated CR hypertension.  6. Hypercholesterolemia: He remains on pravastatin.  Labs are drawn by PCP.  I have spent 45 minutes with this patient in office reviewing medications, past medical history, symptoms, and making recommendations.       Signed, Bettey Mare. Liborio Nixon, ANP, AACC   11/17/2022 12:26 PM      Office [phone] Fax [phone]  Notice: This dictation was prepared with Dragon dictation along with smaller phrase technology. Any transcriptional errors that result from this process are unintentional and may not be corrected upon review.

## 2022-11-17 ENCOUNTER — Ambulatory Visit: Payer: Medicare Other | Admitting: Family

## 2022-11-17 ENCOUNTER — Ambulatory Visit: Payer: Medicare Other | Attending: Adult Health | Admitting: Adult Health

## 2022-11-17 ENCOUNTER — Encounter: Payer: Self-pay | Admitting: Adult Health

## 2022-11-17 VITALS — BP 118/62 | HR 102 | Ht 66.0 in | Wt 153.2 lb

## 2022-11-17 DIAGNOSIS — I4719 Other supraventricular tachycardia: Secondary | ICD-10-CM | POA: Diagnosis not present

## 2022-11-17 DIAGNOSIS — R29898 Other symptoms and signs involving the musculoskeletal system: Secondary | ICD-10-CM | POA: Diagnosis not present

## 2022-11-17 DIAGNOSIS — I152 Hypertension secondary to endocrine disorders: Secondary | ICD-10-CM | POA: Insufficient documentation

## 2022-11-17 DIAGNOSIS — I5033 Acute on chronic diastolic (congestive) heart failure: Secondary | ICD-10-CM | POA: Diagnosis not present

## 2022-11-17 DIAGNOSIS — E1159 Type 2 diabetes mellitus with other circulatory complications: Secondary | ICD-10-CM | POA: Insufficient documentation

## 2022-11-17 DIAGNOSIS — E7800 Pure hypercholesterolemia, unspecified: Secondary | ICD-10-CM | POA: Insufficient documentation

## 2022-11-17 DIAGNOSIS — I5032 Chronic diastolic (congestive) heart failure: Secondary | ICD-10-CM | POA: Insufficient documentation

## 2022-11-17 DIAGNOSIS — I4892 Unspecified atrial flutter: Secondary | ICD-10-CM | POA: Diagnosis not present

## 2022-11-17 DIAGNOSIS — R296 Repeated falls: Secondary | ICD-10-CM | POA: Diagnosis not present

## 2022-11-17 DIAGNOSIS — Z794 Long term (current) use of insulin: Secondary | ICD-10-CM

## 2022-11-17 LAB — BASIC METABOLIC PANEL
BUN/Creatinine Ratio: 21 (ref 10–24)
BUN: 20 mg/dL (ref 8–27)
CO2: 25 mmol/L (ref 20–29)
Calcium: 9.8 mg/dL (ref 8.6–10.2)
Chloride: 98 mmol/L (ref 96–106)
Creatinine, Ser: 0.96 mg/dL (ref 0.76–1.27)
Glucose: 170 mg/dL — ABNORMAL HIGH (ref 70–99)
Potassium: 5.5 mmol/L — ABNORMAL HIGH (ref 3.5–5.2)
Sodium: 136 mmol/L (ref 134–144)
eGFR: 82 mL/min/{1.73_m2} (ref >59)

## 2022-11-17 NOTE — Patient Instructions (Signed)
Medication Instructions:  Stop [employer] *If you need a refill on your cardiac medications before your next appointment, please call your pharmacy*   Lab Work: BMET Today If you have labs (blood work) drawn today and your tests are completely normal, you will receive your results only by: MyChart Message (if you have MyChart) OR A paper copy in the mail If you have any lab test that is abnormal or we need to change your treatment, we will call you to review the results.   Testing/Procedures: No Testing   Follow-Up: At [facility], you and your health needs are our priority.  As part of our continuing mission to provide you with exceptional heart care, we have created designated Provider Care Teams.  These Care Teams include your primary Cardiologist (physician) and Advanced Practice Providers (APPs -  Physician Assistants and Nurse Practitioners) who all work together to provide you with the care you need, when you need it.  We recommend signing up for the patient portal called "MyChart".  Sign up information is provided on this After Visit Summary.  MyChart is used to connect with patients for Virtual Visits (Telemedicine).  Patients are able to view lab/test results, encounter notes, upcoming appointments, etc.  Non-urgent messages can be sent to your provider as well.   To learn more about what you can do with MyChart, go to [email or web address].    Your next appointment:   6 week(s)  Provider:   Reatha Harps, MD (6 weeks) Steffanie Dunn MD ( first Available)

## 2022-11-18 ENCOUNTER — Telehealth: Payer: Self-pay

## 2022-11-18 DIAGNOSIS — I5032 Chronic diastolic (congestive) heart failure: Secondary | ICD-10-CM

## 2022-11-18 NOTE — Progress Notes (Signed)
[facility] Summary Report / Loop Recorder 

## 2022-11-18 NOTE — Telephone Encounter (Addendum)
Called patient regarding results. Left detailed message for patient. Letter mailed to patient. Labs orderes mailed to patient ----- Message from Joni Reining sent at 11/18/2022  7:43 AM EDT ----- I have reviewed the labs. Blood glucose was elevated, but this was not a fasting lab.  Potassium was elevated. He will need to repeat the labs to make sure this is accurate. He is on spironolactone which keeps potassium in the blood. May have to reduce the dose if potassium remains elevated,   KL

## 2022-11-19 ENCOUNTER — Ambulatory Visit: Payer: Medicare Other

## 2022-11-19 VITALS — Ht 66.0 in | Wt 153.0 lb

## 2022-11-19 DIAGNOSIS — Z0000 Encounter for general adult medical examination without abnormal findings: Secondary | ICD-10-CM

## 2022-11-19 NOTE — Telephone Encounter (Signed)
Called patient regarding results. Party answering call stated wrong number.

## 2022-11-19 NOTE — Telephone Encounter (Signed)
Patient was returning call. Please advise ?

## 2022-11-19 NOTE — Patient Instructions (Signed)
John [name] , Thank you for taking time to come for your Medicare Wellness Visit. I appreciate your ongoing commitment to your health goals. Please review the following plan we discussed and let me know if I can assist you in the future.   Referrals/Orders/Follow-Ups/Clinician Recommendations: Aim for 30 minutes of exercise or brisk walking, 6-8 glasses of water, and 5 servings of fruits and vegetables each day.   This is a list of the screening recommended for you and due dates:  Health Maintenance  Topic Date Due   COVID-19 Vaccine (5 - 2023-24 season) 05/23/2022   Eye exam for diabetics  10/22/2022   Flu Shot  11/06/2022   Complete foot exam   02/25/2023   Hemoglobin A1C  03/17/2023   Yearly kidney health urinalysis for diabetes  09/23/2023   Yearly kidney function blood test for diabetes  11/17/2023   Medicare Annual Wellness Visit  11/19/2023   DTaP/Tdap/Td vaccine (2 - Td or Tdap) 05/16/2029   Pneumonia Vaccine  Completed   Hepatitis C Screening  Completed   Zoster (Shingles) Vaccine  Completed   HPV Vaccine  Aged Out   Cologuard (Stool DNA test)  Discontinued    Advanced directives: (Provided) Advance directive discussed with you today. I have provided a copy for you to complete at home and have notarized. Once this is complete, please bring a copy in to our office so we can scan it into your chart. Information on Advanced Care Planning can be found at [facility] Advance Health Care Directives Advance Health Care Directives ([email or web address]    Next Medicare Annual Wellness Visit scheduled for next year: Yes  Preventive Care 65 Years and Older, Male  Preventive care refers to lifestyle choices and visits with your health care provider that can promote health and wellness. What does preventive care include? A yearly physical exam. This is also called an annual well check. Dental exams once or twice a year. Routine eye exams. Ask your health care provider how  often you should have your eyes checked. Personal lifestyle choices, including: Daily care of your teeth and gums. Regular physical activity. Eating a healthy diet. Avoiding tobacco and drug use. Limiting alcohol use. Practicing safe sex. Taking low doses of aspirin every day. Taking vitamin and mineral supplements as recommended by your health care provider. What happens during an annual well check? The services and screenings done by your health care provider during your annual well check will depend on your age, overall health, lifestyle risk factors, and family history of disease. Counseling  Your health care provider may ask you questions about your: Alcohol use. Tobacco use. Drug use. Emotional well-being. Home and relationship well-being. Sexual activity. Eating habits. History of falls. Memory and ability to understand (cognition). Work and work Astronomer. Screening  You may have the following tests or measurements: Height, weight, and BMI. Blood pressure. Lipid and cholesterol levels. These may be checked every 5 years, or more frequently if you are over 65 years old. Skin check. Lung cancer screening. You may have this screening every year starting at age 57 if you have a 30-pack-year history of smoking and currently smoke or have quit within the past 15 years. Fecal occult blood test (FOBT) of the stool. You may have this test every year starting at age 33. Flexible sigmoidoscopy or colonoscopy. You may have a sigmoidoscopy every 5 years or a colonoscopy every 10 years starting at age 20. Prostate cancer screening. Recommendations will vary depending on  your family history and other risks. Hepatitis C blood test. Hepatitis B blood test. Sexually transmitted disease (STD) testing. Diabetes screening. This is done by checking your blood sugar (glucose) after you have not eaten for a while (fasting). You may have this done every 1-3 years. Abdominal aortic aneurysm  (AAA) screening. You may need this if you are a current or former smoker. Osteoporosis. You may be screened starting at age 87 if you are at high risk. Talk with your health care provider about your test results, treatment options, and if necessary, the need for more tests. Vaccines  Your health care provider may recommend certain vaccines, such as: Influenza vaccine. This is recommended every year. Tetanus, diphtheria, and acellular pertussis (Tdap, Td) vaccine. You may need a Td booster every 10 years. Zoster vaccine. You may need this after age 52. Pneumococcal 13-valent conjugate (PCV13) vaccine. One dose is recommended after age 64. Pneumococcal polysaccharide (PPSV23) vaccine. One dose is recommended after age 41. Talk to your health care provider about which screenings and vaccines you need and how often you need them. This information is not intended to replace advice given to you by your health care provider. Make sure you discuss any questions you have with your health care provider. Document Released: 04/20/2015 Document Revised: 12/12/2015 Document Reviewed: 01/23/2015 Elsevier Interactive Patient Education  2017 [employer].  Fall Prevention in the Home Falls can cause injuries. They can happen to people of all ages. There are many things you can do to make your home safe and to help prevent falls. What can I do on the outside of my home? Regularly fix the edges of walkways and driveways and fix any cracks. Remove anything that might make you trip as you walk through a door, such as a raised step or threshold. Trim any bushes or trees on the path to your home. Use bright outdoor lighting. Clear any walking paths of anything that might make someone trip, such as rocks or tools. Regularly check to see if handrails are loose or broken. Make sure that both sides of any steps have handrails. Any raised decks and porches should have guardrails on the edges. Have any leaves, snow, or  ice cleared regularly. Use sand or salt on walking paths during winter. Clean up any spills in your garage right away. This includes oil or grease spills. What can I do in the bathroom? Use night lights. Install grab bars by the toilet and in the tub and shower. Do not use towel bars as grab bars. Use non-skid mats or decals in the tub or shower. If you need to sit down in the shower, use a plastic, non-slip stool. Keep the floor dry. Clean up any water that spills on the floor as soon as it happens. Remove soap buildup in the tub or shower regularly. Attach bath mats securely with double-sided non-slip rug tape. Do not have throw rugs and other things on the floor that can make you trip. What can I do in the bedroom? Use night lights. Make sure that you have a light by your bed that is easy to reach. Do not use any sheets or blankets that are too big for your bed. They should not hang down onto the floor. Have a firm chair that has side arms. You can use this for support while you get dressed. Do not have throw rugs and other things on the floor that can make you trip. What can I do in the kitchen? Clean  up any spills right away. Avoid walking on wet floors. Keep items that you use a lot in easy-to-reach places. If you need to reach something above you, use a strong step stool that has a grab bar. Keep electrical cords out of the way. Do not use floor polish or wax that makes floors slippery. If you must use wax, use non-skid floor wax. Do not have throw rugs and other things on the floor that can make you trip. What can I do with my stairs? Do not leave any items on the stairs. Make sure that there are handrails on both sides of the stairs and use them. Fix handrails that are broken or loose. Make sure that handrails are as long as the stairways. Check any carpeting to make sure that it is firmly attached to the stairs. Fix any carpet that is loose or worn. Avoid having throw rugs at  the top or bottom of the stairs. If you do have throw rugs, attach them to the floor with carpet tape. Make sure that you have a light switch at the top of the stairs and the bottom of the stairs. If you do not have them, ask someone to add them for you. What else can I do to help prevent falls? Wear shoes that: Do not have high heels. Have rubber bottoms. Are comfortable and fit you well. Are closed at the toe. Do not wear sandals. If you use a stepladder: Make sure that it is fully opened. Do not climb a closed stepladder. Make sure that both sides of the stepladder are locked into place. Ask someone to hold it for you, if possible. Clearly mark and make sure that you can see: Any grab bars or handrails. First and last steps. Where the edge of each step is. Use tools that help you move around (mobility aids) if they are needed. These include: Canes. Walkers. Scooters. Crutches. Turn on the lights when you go into a dark area. Replace any light bulbs as soon as they burn out. Set up your furniture so you have a clear path. Avoid moving your furniture around. If any of your floors are uneven, fix them. If there are any pets around you, be aware of where they are. Review your medicines with your doctor. Some medicines can make you feel dizzy. This can increase your chance of falling. Ask your doctor what other things that you can do to help prevent falls. This information is not intended to replace advice given to you by your health care provider. Make sure you discuss any questions you have with your health care provider. Document Released: 01/18/2009 Document Revised: 08/30/2015 Document Reviewed: 04/28/2014 Elsevier Interactive Patient Education  2017 [employer].

## 2022-11-19 NOTE — Progress Notes (Signed)
Subjective:   John [name] is a 77 y.o. male who presents for Medicare Annual/Subsequent preventive examination.  Visit Complete: Virtual  I connected with  [name] on 11/19/22 by a audio enabled telemedicine application and verified that I am speaking with the correct person using two identifiers.  Patient Location: Home  Provider Location: Home Office  I discussed the limitations of evaluation and management by telemedicine. The patient expressed understanding and agreed to proceed.  Patient Medicare AWV questionnaire was completed by the patient on 11/19/2022; I have confirmed that all information answered by patient is correct and no changes since this date.  Review of Systems    Vital Signs: Unable to obtain new vitals due to this being a telehealth visit.  Cardiac Risk Factors include: advanced age (>9men, >59 women);diabetes mellitus;dyslipidemia;male gender;hypertension;smoking/ tobacco exposure;sedentary lifestyleNutrition Risk Assessment:  Has the patient had any N/V/D within the last 2 months?  No  Does the patient have any non-healing wounds?  No  Has the patient had any unintentional weight loss or weight gain?  No   Diabetes:  Is the patient diabetic?  Yes  If diabetic, was a CBG obtained today?  No  Did the patient bring in their glucometer from home?  No  How often do you monitor your CBG's? 3 x day .   Financial Strains and Diabetes Management:  Are you having any financial strains with the device, your supplies or your medication? No .  Does the patient want to be seen by Chronic Care Management for management of their diabetes?  No  Would the patient like to be referred to a Nutritionist or for Diabetic Management?  No   Diabetic Exams:  Diabetic Eye Exam: Completed 08/2022 Diabetic Foot Exam: Overdue, Pt has been advised about the importance in completing this exam. Pt is scheduled for diabetic foot exam on next office visit .       Objective:    Today's Vitals   11/19/22 0811  Weight: 153 lb (69.4 kg)  Height: 5\' 6"  (1.676 m)   Body mass index is 24.69 kg/m.     11/19/2022    8:16 AM 09/19/2022   11:51 AM 07/28/2022   11:35 AM 05/26/2022    8:35 AM 05/21/2022    9:57 AM 05/05/2022   11:20 AM 04/25/2022   10:01 AM  Advanced Directives  Does Patient Have a Medical Advance Directive? No No No No No No No  Does patient want to make changes to medical advance directive?   No - Patient declined      Copy of Healthcare Power of Attorney in Chart?    No - copy requested     Would patient like information on creating a medical advance directive? Yes (MAU/Ambulatory/Procedural Areas - Information given) No - Patient declined   No - Patient declined No - Patient declined No - Patient declined    Current Medications (verified) Outpatient Encounter Medications as of 11/19/2022  Medication Sig   allopurinol (ZYLOPRIM) 300 MG tablet Take 1 tablet (300 mg total) by mouth daily.   aspirin EC 81 MG tablet Take 1 tablet (81 mg total) by mouth daily. Swallow whole.   Blood Glucose Monitoring Suppl (ONETOUCH VERIO REFLECT) w/Device KIT Use to test blood sugars daily as directed. DX: E11.9   celecoxib (CELEBREX) 200 MG capsule Take 1 capsule (200 mg total) by mouth daily.   cetirizine (ZYRTEC ALLERGY) 10 MG tablet Take 1 tablet (10 mg total) by mouth daily.  colchicine 0.6 MG tablet Take 2 tabs immediately, then 1 tab twice per day for the duration of the flare up to a max of 7 days   Continuous Glucose Receiver (FREESTYLE LIBRE 3 READER) DEVI 1 Device by Does not apply route every 14 (fourteen) days.   Continuous Glucose Sensor (FREESTYLE LIBRE 3 SENSOR) MISC 1 Device by Does not apply route continuous.   dapagliflozin propanediol (FARXIGA) 5 MG TABS tablet Take 1 tablet (5 mg total) by mouth daily before breakfast.   dutasteride (AVODART) 0.5 MG capsule Take 1 capsule (0.5 mg total) by mouth daily.   ezetimibe (ZETIA) 10 MG  tablet TAKE 1 TABLET BY MOUTH EVERY DAY   famotidine (PEPCID) 20 MG tablet TAKE 1 TABLET BY MOUTH DAILY AFTER SUPPER   fluticasone (FLONASE) 50 MCG/ACT nasal spray PLACE 1 SPRAY INTO BOTH NOSTRILS DAILY AS NEEDED FOR ALLERGIES OR RHINITIS.   Fluticasone-Umeclidin-Vilant (TRELEGY ELLIPTA) 100-62.5-25 MCG/ACT AEPB Inhale 1 puff into the lungs daily.   gabapentin (NEURONTIN) 300 MG capsule TAKE 1 CAPSULE BY MOUTH THREE TIMES A DAY   hydrOXYzine (VISTARIL) 25 MG capsule Take 1 capsule (25 mg total) by mouth every 8 (eight) hours as needed.   insulin degludec (TRESIBA FLEXTOUCH) 200 UNIT/ML FlexTouch Pen Inject 18 Units into the skin every evening.   Insulin Pen Needle (PEN NEEDLES) 32G X 4 MM MISC 1 Application by Does not apply route 3 (three) times daily.   KLOR-CON M20 20 MEQ tablet TAKE 1 TABLET BY MOUTH EVERY DAY   levothyroxine (SYNTHROID) 50 MCG tablet TAKE 1 TABLET BY MOUTH EVERY DAY   linaclotide (LINZESS) 72 MCG capsule Take 1 capsule (72 mcg total) by mouth daily as needed (constipation).   magic mouthwash w/lidocaine SOLN Take 10 mLs by mouth 3 (three) times daily as needed for mouth pain.   metoprolol succinate (TOPROL XL) 25 MG 24 hr tablet Take 0.5 tablets (12.5 mg total) by mouth daily.   NON FORMULARY CPAP at bedtime   nystatin (MYCOSTATIN) 100000 UNIT/ML suspension Take 5 mLs (500,000 Units total) by mouth 4 (four) times daily.   ondansetron (ZOFRAN) 4 MG tablet Take 1 tablet (4 mg total) by mouth every 8 (eight) hours as needed for nausea or vomiting.   OneTouch Delica Lancets 30G MISC Use to test blood sugars daily as directed. DX: E11.9   ONETOUCH VERIO test strip USE TO TEST BLOOD SUGARS DAILY AS DIRECTED. DX: E11.9   OXYGEN Inhale 2 L into the lungs daily as needed (with Cpap at night).   pantoprazole (PROTONIX) 20 MG tablet TAKE 1 TABLET BY MOUTH EVERY DAY   polyethylene glycol powder (GLYCOLAX/MIRALAX) 17 GM/SCOOP powder Take 17 g by mouth 2 (two) times daily as needed.    pravastatin (PRAVACHOL) 40 MG tablet TAKE 1 TABLET (40 MG TOTAL) BY MOUTH ON MONDAYS , WEDNESDAYS, AND FRIDAYS AS DIRECTED   spironolactone (ALDACTONE) 25 MG tablet TAKE 1 TABLET (25 MG TOTAL) BY MOUTH DAILY.   tiZANidine (ZANAFLEX) 4 MG tablet Take 1 tablet (4 mg total) by mouth every 8 (eight) hours as needed for muscle spasms.   torsemide (DEMADEX) 20 MG tablet Take 1 tablet (20 mg total) by mouth daily.   zolpidem (AMBIEN) 5 MG tablet Take 1 tablet (5 mg total) by mouth at bedtime as needed for sleep.   No facility-administered encounter medications on file as of 11/19/2022.    Allergies (verified) Sulfa antibiotics, Atorvastatin, Jardiance [empagliflozin], Lopressor [metoprolol], Rosuvastatin, Doxycycline, Tramadol, and Temazepam   History: Past  Medical History:  Diagnosis Date   Adenocarcinoma of lung, right (HCC) 04/18/2016   Anemia    Anxiety    Arthritis    Asthma    Atrial flutter (HCC)    BPH (benign prostatic hyperplasia)    with urinary retention 02/06/20   CHF (congestive heart failure) (HCC)    COPD (chronic obstructive pulmonary disease) (HCC)    Depression    Diabetes mellitus, type II (HCC)    Dyspnea    History of kidney stones    History of radiation therapy    right lung 09/10/2020-09/17/2020   Dr Roselind Messier   History of radiation therapy    Left and right Lung- 06/19/22-06/26/22   Hyperlipidemia    Hypertension    Hypothyroidism    Macular degeneration    Neuropathy    Non-small cell lung cancer, right (HCC) 04/18/2016   Peripheral vascular disease (HCC)    Prostatitis    Pulmonary nodule, left 07/16/2016   Sleep apnea    cpap   Past Surgical History:  Procedure Laterality Date   A-FLUTTER ABLATION N/A 01/13/2022   Procedure: A-FLUTTER ABLATION;  Surgeon: Lanier Prude, MD;  Location: [facility];  Service: Cardiovascular;  Laterality: N/A;   ABDOMINAL AORTOGRAM W/LOWER EXTREMITY Left 02/06/2020   Procedure: ABDOMINAL AORTOGRAM W/LOWER  EXTREMITY;  Surgeon: Runell Gess, MD;  Location: [facility];  Service: Cardiovascular;  Laterality: Left;   ABDOMINAL AORTOGRAM W/LOWER EXTREMITY N/A 05/30/2021   Procedure: ABDOMINAL AORTOGRAM W/LOWER EXTREMITY;  Surgeon: Cephus Shelling, MD;  Location: [facility];  Service: Cardiovascular;  Laterality: N/A;   BIOPSY  09/16/2021   Procedure: BIOPSY;  Surgeon: Iva Boop, MD;  Location: [facility] ENDOSCOPY;  Service: Gastroenterology;;   CARDIOVERSION N/A 10/05/2020   Procedure: CARDIOVERSION;  Surgeon: Sande Rives, MD;  Location: [facility] ENDOSCOPY;  Service: Cardiovascular;  Laterality: N/A;   CATARACT EXTRACTION, BILATERAL Bilateral    COLONOSCOPY WITH PROPOFOL N/A 06/20/2021   Procedure: COLONOSCOPY WITH PROPOFOL;  Surgeon: Hilarie Fredrickson, MD;  Location: [facility] ENDOSCOPY;  Service: Endoscopy;  Laterality: N/A;   COLONOSCOPY WITH PROPOFOL N/A 09/16/2021   Procedure: COLONOSCOPY WITH PROPOFOL;  Surgeon: Iva Boop, MD;  Location: [facility] ENDOSCOPY;  Service: Gastroenterology;  Laterality: N/A;   ENDARTERECTOMY FEMORAL Right 08/20/2020   Procedure: ENDARTERECTOMY  RIGHT FEMORAL ARTERY;  Surgeon: Cephus Shelling, MD;  Location[facility] OR;  Service: Vascular;  Laterality: Right;   ENTEROSCOPY N/A 10/11/2021   Procedure: ENTEROSCOPY;  Surgeon: Dolores Frame, MD;  Location: [facility] ENDO SUITE;  Service: Gastroenterology;  Laterality: N/A;   ESOPHAGOGASTRODUODENOSCOPY N/A 09/16/2021   Procedure: ESOPHAGOGASTRODUODENOSCOPY (EGD);  Surgeon: Iva Boop, MD;  Location: [facility] ENDOSCOPY;  Service: Gastroenterology;  Laterality: N/A;   ESOPHAGOGASTRODUODENOSCOPY (EGD) WITH PROPOFOL N/A 09/06/2020   Procedure: ESOPHAGOGASTRODUODENOSCOPY (EGD) WITH PROPOFOL;  Surgeon: Hilarie Fredrickson, MD;  Location: [facility] ENDOSCOPY;  Service: Endoscopy;  Laterality: N/A;   ESOPHAGOGASTRODUODENOSCOPY (EGD) WITH PROPOFOL N/A 10/11/2021   Procedure: ESOPHAGOGASTRODUODENOSCOPY (EGD) WITH PROPOFOL;  Surgeon:  Dolores Frame, MD;  [facility]: [facility];  Service: Gastroenterology;  Laterality: N/A;   HEMOSTASIS CLIP PLACEMENT  09/16/2021   Procedure: HEMOSTASIS CLIP PLACEMENT;  Surgeon: Iva Boop, MD;  Location: [facility] ENDOSCOPY;  Service: Gastroenterology;;   HOT HEMOSTASIS N/A 09/16/2021   Procedure: HOT HEMOSTASIS (ARGON PLASMA COAGULATION/BICAP);  Surgeon: Iva Boop, MD;  Location: [facility] ENDOSCOPY;  Service: Gastroenterology;  Laterality: N/A;   HOT HEMOSTASIS  10/11/2021  Procedure: HOT HEMOSTASIS (ARGON PLASMA COAGULATION/BICAP);  Surgeon: Marguerita Merles, Reuel Boom, MD;  Location: [facility];  Service: Gastroenterology;;   INSERTION OF ILIAC STENT Right 08/20/2020   Procedure: RETROGRADE INSERTION OF RIGHT ILIAC STENT;  Surgeon: Cephus Shelling, MD;  Location: [facility] OR;  Service: Vascular;  Laterality: Right;   INTRAOPERATIVE ARTERIOGRAM Right 08/20/2020   Procedure: INTRA OPERATIVE ARTERIOGRAM ILIAC;  Surgeon: Cephus Shelling, MD;  Location: [facility] OR;  Service: Vascular;  Laterality: Right;   PATCH ANGIOPLASTY Right 08/20/2020   Procedure: PATCH ANGIOPLASTY RIGHT FEMORAL ARTERY;  Surgeon: Cephus Shelling, MD;  Location: [facility] OR;  Service: Vascular;  Laterality: Right;   PERIPHERAL VASCULAR BALLOON ANGIOPLASTY Right 05/30/2021   Procedure: PERIPHERAL VASCULAR BALLOON ANGIOPLASTY;  Surgeon: Cephus Shelling, MD;  [facility];  Service: Cardiovascular;  Laterality: Right;   PORTACATH PLACEMENT Left 06/13/2016   Procedure: INSERTION PORT-A-CATH;  Surgeon: Franky Macho, MD;  Location: [facility] ORS;  Service: General;  Laterality: Left;   TRANSURETHRAL RESECTION OF PROSTATE N/A 05/31/2020   Procedure: TRANSURETHRAL RESECTION OF THE PROSTATE (TURP);  Surgeon: Malen Gauze, MD;  Location: [facility] ORS;  Service: Urology;  Laterality: N/A;   VIDEO BRONCHOSCOPY WITH ENDOBRONCHIAL NAVIGATION N/A 05/28/2016   Procedure: VIDEO BRONCHOSCOPY WITH ENDOBRONCHIAL NAVIGATION;   Surgeon: Loreli Slot, MD;  Location: [facility] OR;  Service: Thoracic;  Laterality: N/A;   VIDEO BRONCHOSCOPY WITH ENDOBRONCHIAL ULTRASOUND N/A 05/28/2016   Procedure: VIDEO BRONCHOSCOPY WITH ENDOBRONCHIAL ULTRASOUND;  Surgeon: Loreli Slot, MD;  Location: [facility] OR;  Service: Thoracic;  Laterality: N/A;   Family History  Problem Relation Age of Onset   Hypertension Mother    Diabetes Father    Heart disease Father    Stroke Father    Hypertension Sister    Social History   Socioeconomic History   Marital status: Significant Other    Spouse name: Not on file   Number of children: 5   Years of education: 10   Highest education level: 10th grade  Occupational History   Occupation: Retired  Tobacco Use   Smoking status: Every Day    Current packs/day: 0.50    Average packs/day: 0.5 packs/day for 61.7 years (30.8 ttl pk-yrs)    Types: Cigarettes    Start date: 03/11/1961   Smokeless tobacco: Never   Tobacco comments:    1/2 pack to 1 pack per day 12/14/19  Vaping Use   Vaping status: Never Used  Substance and Sexual Activity   Alcohol use: No   Drug use: No   Sexual activity: Yes    Birth control/protection: None  Other Topics Concern   Not on file  Social History Narrative   [name] is his POA and lives with him. Children out of town - one in [facility], others in [place].   Social Determinants of Health   Financial Resource Strain: Low Risk  (11/19/2022)   Overall Financial Resource Strain (CARDIA)    Difficulty of Paying Living Expenses: Not hard at all  Food Insecurity: No Food Insecurity (11/19/2022)   Hunger Vital Sign    Worried About Running Out of Food in the Last Year: Never true    Ran Out of Food in the Last Year: Never true  Transportation Needs: No Transportation Needs (11/19/2022)   PRAPARE - Administrator, Civil Service (Medical): No    Lack of Transportation (Non-Medical): No  Physical Activity: Insufficiently Active (11/19/2022)    Exercise Vital Sign  Days of Exercise per Week: 2 days    Minutes of Exercise per Session: 30 min  Stress: No Stress Concern Present (11/19/2022)   [employer] of Occupational Health - Occupational Stress Questionnaire    Feeling of Stress : Not at all  Social Connections: Socially Isolated (11/19/2022)   Social Connection and Isolation Panel [NHANES]    Frequency of Communication with Friends and Family: More than three times a week    Frequency of Social Gatherings with Friends and Family: Never    Attends Religious Services: Never    Database administrator or Organizations: No    Attends Engineer, structural: Never    Marital Status: Divorced    Tobacco Counseling Ready to quit: No Counseling given: Not Answered Tobacco comments: 1/2 pack to 1 pack per day 12/14/19   Clinical Intake:  Pre-visit preparation completed: Yes  Pain : No/denies pain     Nutritional Risks: None Diabetes: Yes CBG done?: No Did pt. bring in CBG monitor from home?: No  How often do you need to have someone help you when you read instructions, pamphlets, or other written materials from your doctor or pharmacy?: 1 - Never  Interpreter Needed?: No  Information entered by :: Renie Ora, LPN   Activities of Daily Living    11/19/2022    8:16 AM 01/13/2022    9:09 AM  In your present state of health, do you have any difficulty performing the following activities:  Hearing? 0 0  Vision? 0 0  Difficulty concentrating or making decisions? 0 0  Walking or climbing stairs? 0 0  Dressing or bathing? 0 0  Doing errands, shopping? 0   Preparing Food and eating ? N   Using the Toilet? N   In the past six months, have you accidently leaked urine? N   Do you have problems with loss of bowel control? N   Managing your Medications? N   Managing your Finances? N   Housekeeping or managing your Housekeeping? N     Patient Care Team: Junie Spencer, FNP as PCP - General (Family  Medicine) O'Neal, Ronnald Ramp, MD as PCP - Cardiology (Cardiology) Lanier Prude, MD as PCP - Electrophysiology (Cardiology) Doreatha Massed, MD as Consulting Physician (Hematology) Danella Maiers, [facility] (Pharmacist) O'Neal, Ronnald Ramp, MD as Consulting Physician (Cardiology) McKenzie, Mardene Celeste, MD as Consulting Physician (Urology) Delora Fuel, OD (Optometry) Dani Gobble, NP as Nurse Practitioner (Endocrinology) Adam Phenix, DPM as Consulting Physician (Podiatry) Cephus Shelling, MD as Consulting Physician (Vascular Surgery)  Indicate any recent Medical Services you may have received from other than [facility] providers in the past year (date may be approximate).     Assessment:   This is a routine wellness examination for Adeoluwa.  Hearing/Vision screen Vision Screening - Comments:: Wears rx glasses - up to date with routine eye exams with  Dr.Johnson   Dietary issues and exercise activities discussed:     Goals Addressed             This Visit's Progress    Exercise 3x per week (30 min per time)         Depression Screen    11/19/2022    8:14 AM 11/03/2022    9:37 AM 09/15/2022    9:37 AM 09/08/2022   12:01 PM 08/11/2022    2:27 PM 07/18/2022    8:42 AM 05/26/2022    8:57 AM  PHQ 2/9 Scores  PHQ - 2  Score 0 1 1 0 1 0 0  PHQ- 9 Score 0 3 6 6 8   0    Fall Risk    11/19/2022    8:13 AM 11/03/2022    9:37 AM 09/15/2022    9:37 AM 09/08/2022   12:01 PM 08/15/2022    1:58 PM  Fall Risk   Falls in the past year? 1 1 1 1 1   Number falls in past yr: 1 1 1 1 1   Injury with Fall? 1 0 1 0 1  Risk for fall due to : History of fall(s);Impaired balance/gait;Orthopedic patient History of fall(s) History of fall(s);Impaired balance/gait Impaired balance/gait History of fall(s)  Follow up Education provided;Falls prevention discussed;Falls evaluation completed Falls evaluation completed Falls evaluation completed;Education provided Falls evaluation completed      MEDICARE RISK AT HOME:  Medicare Risk at Home - 11/19/22 0813     Any stairs in or around the home? No    If so, are there any without handrails? No    Home free of loose throw rugs in walkways, pet beds, electrical cords, etc? Yes    Adequate lighting in your home to reduce risk of falls? Yes    Life alert? No    Use of a cane, walker or w/c? Yes    Grab bars in the bathroom? Yes    Shower chair or bench in shower? Yes    Elevated toilet seat or a handicapped toilet? Yes             TIMED UP AND GO:  Was the test performed?  No    Cognitive Function:    06/17/2017    2:44 PM  MMSE - Mini Mental State Exam  Orientation to time 5  Orientation to Place 4  Registration 3  Attention/ Calculation 5  Recall 3  Language- name 2 objects 2  Language- repeat 1  Language- follow 3 step command 3  Language- read & follow direction 1  Write a sentence 1  Copy design 0  Total score 28        11/19/2022    8:16 AM 11/15/2021    8:26 AM 05/25/2019   10:37 AM  6CIT Screen  What Year? 0 points 0 points 0 points  What month? 0 points 0 points 0 points  What time? 0 points 0 points 0 points  Count back from 20 0 points 0 points 0 points  Months in reverse 0 points 0 points 0 points  Repeat phrase 0 points 2 points 0 points  Total Score 0 points 2 points 0 points    Immunizations Immunization History  Administered Date(s) Administered   Covid-19, Mrna,Vaccine(Spikevax)76yrs and older 03/28/2022   Fluad Quad(high Dose 65+) 01/19/2019, 02/01/2020, 01/23/2021, 01/28/2022   Influenza, High Dose Seasonal PF 12/28/2015   Influenza,inj,Quad PF,6+ Mos 01/23/2017   Moderna Sars-Covid-2 Vaccination 05/10/2019, 06/07/2019, 01/31/2020   Pneumococcal Conjugate-13 06/17/2017   Pneumococcal Polysaccharide-23 04/25/2019   Respiratory Syncytial Virus Vaccine,Recomb Aduvanted(Arexvy) 03/28/2022   Tdap 05/17/2019   Zoster Recombinant(Shingrix) 11/25/2021, 02/12/2022   Zoster, Live  07/10/2014    TDAP status: Up to date  Flu Vaccine status: Up to date  Pneumococcal vaccine status: Up to date  Covid-19 vaccine status: Completed vaccines  Qualifies for Shingles Vaccine? Yes   Zostavax completed Yes   Shingrix Completed?: Yes  Screening Tests Health Maintenance  Topic Date Due   COVID-19 Vaccine (5 - 2023-24 season) 05/23/2022   OPHTHALMOLOGY EXAM  10/22/2022   INFLUENZA  VACCINE  11/06/2022   FOOT EXAM  02/25/2023   HEMOGLOBIN A1C  03/17/2023   Diabetic kidney evaluation - Urine ACR  09/23/2023   Diabetic kidney evaluation - eGFR measurement  11/17/2023   Medicare Annual Wellness (AWV)  11/19/2023   DTaP/Tdap/Td (2 - Td or Tdap) 05/16/2029   Pneumonia Vaccine 10+ Years old  Completed   Hepatitis C Screening  Completed   Zoster Vaccines- Shingrix  Completed   HPV VACCINES  Aged Out   Fecal DNA (Cologuard)  Discontinued    Health Maintenance  Health Maintenance Due  Topic Date Due   COVID-19 Vaccine (5 - 2023-24 season) 05/23/2022   OPHTHALMOLOGY EXAM  10/22/2022   INFLUENZA VACCINE  11/06/2022    Colorectal cancer screening: No longer required.   Lung Cancer Screening: (Low Dose CT Chest recommended if Age 53-80 years, 20 pack-year currently smoking OR have quit w/in 15years.) does qualify.   Lung Cancer Screening Referral: evaluated every 3-6 months HX- Lung Cancer   Additional Screening:  Hepatitis C Screening: does not qualify; Completed 12/29/2017  Vision Screening: Recommended annual ophthalmology exams for early detection of glaucoma and other disorders of the eye. Is the patient up to date with their annual eye exam?  Yes  Who is the provider or what is the name of the office in which the patient attends annual eye exams? Dr.Johnson  If pt is not established with a provider, would they like to be referred to a provider to establish care? No .   Dental Screening: Recommended annual dental exams for proper oral hygiene    Community  Resource Referral / Chronic Care Management: CRR required this visit?  No   CCM required this visit?  No     Plan:     I have personally reviewed and noted the following in the patient's chart:   Medical and social history Use of alcohol, tobacco or illicit drugs  Current medications and supplements including opioid prescriptions. Patient is not currently taking opioid prescriptions. Functional ability and status Nutritional status Physical activity Advanced directives List of other physicians Hospitalizations, surgeries, and ER visits in previous 12 months Vitals Screenings to include cognitive, depression, and falls Referrals and appointments  In addition, I have reviewed and discussed with patient certain preventive protocols, quality metrics, and best practice recommendations. A written personalized care plan for preventive services as well as general preventive health recommendations were provided to patient.     Lorrene Reid, LPN   [id number]   After Visit Summary: (MyChart) Due to this being a telephonic visit, the after visit summary with patients personalized plan was offered to patient via MyChart   Nurse Notes: none

## 2022-11-20 ENCOUNTER — Encounter: Payer: Self-pay | Admitting: Family

## 2022-11-20 ENCOUNTER — Ambulatory Visit (INDEPENDENT_AMBULATORY_CARE_PROVIDER_SITE_OTHER): Payer: Medicare Other | Admitting: Family

## 2022-11-20 VITALS — BP 138/68 | HR 113 | Temp 97.8°F | Ht 66.0 in | Wt 150.8 lb

## 2022-11-20 DIAGNOSIS — E1159 Type 2 diabetes mellitus with other circulatory complications: Secondary | ICD-10-CM

## 2022-11-20 DIAGNOSIS — I152 Hypertension secondary to endocrine disorders: Secondary | ICD-10-CM | POA: Diagnosis not present

## 2022-11-20 DIAGNOSIS — R531 Weakness: Secondary | ICD-10-CM

## 2022-11-20 DIAGNOSIS — M159 Polyosteoarthritis, unspecified: Secondary | ICD-10-CM

## 2022-11-20 DIAGNOSIS — N401 Enlarged prostate with lower urinary tract symptoms: Secondary | ICD-10-CM

## 2022-11-20 DIAGNOSIS — I5033 Acute on chronic diastolic (congestive) heart failure: Secondary | ICD-10-CM

## 2022-11-20 DIAGNOSIS — J449 Chronic obstructive pulmonary disease, unspecified: Secondary | ICD-10-CM

## 2022-11-20 DIAGNOSIS — G4733 Obstructive sleep apnea (adult) (pediatric): Secondary | ICD-10-CM

## 2022-11-20 DIAGNOSIS — I110 Hypertensive heart disease with heart failure: Secondary | ICD-10-CM | POA: Diagnosis not present

## 2022-11-20 DIAGNOSIS — I4891 Unspecified atrial fibrillation: Secondary | ICD-10-CM | POA: Diagnosis not present

## 2022-11-20 DIAGNOSIS — M25551 Pain in right hip: Secondary | ICD-10-CM | POA: Diagnosis not present

## 2022-11-20 DIAGNOSIS — J9611 Chronic respiratory failure with hypoxia: Secondary | ICD-10-CM

## 2022-11-20 DIAGNOSIS — E1169 Type 2 diabetes mellitus with other specified complication: Secondary | ICD-10-CM

## 2022-11-20 DIAGNOSIS — E1142 Type 2 diabetes mellitus with diabetic polyneuropathy: Secondary | ICD-10-CM | POA: Diagnosis not present

## 2022-11-20 DIAGNOSIS — M25552 Pain in left hip: Secondary | ICD-10-CM

## 2022-11-20 DIAGNOSIS — R296 Repeated falls: Secondary | ICD-10-CM

## 2022-11-20 DIAGNOSIS — F17210 Nicotine dependence, cigarettes, uncomplicated: Secondary | ICD-10-CM

## 2022-11-20 DIAGNOSIS — N644 Mastodynia: Secondary | ICD-10-CM | POA: Diagnosis not present

## 2022-11-20 DIAGNOSIS — C3491 Malignant neoplasm of unspecified part of right bronchus or lung: Secondary | ICD-10-CM

## 2022-11-20 DIAGNOSIS — F5101 Primary insomnia: Secondary | ICD-10-CM

## 2022-11-20 DIAGNOSIS — E1151 Type 2 diabetes mellitus with diabetic peripheral angiopathy without gangrene: Secondary | ICD-10-CM | POA: Diagnosis not present

## 2022-11-20 DIAGNOSIS — E039 Hypothyroidism, unspecified: Secondary | ICD-10-CM | POA: Diagnosis not present

## 2022-11-20 NOTE — Progress Notes (Addendum)
Subjective:    Patient ID: John [name], male    DOB: [birth year], 77 y.o.   MRN: [id number]  Chief Complaint  Patient presents with   Medical Management of Chronic Issues     Pt presents to the office today for  chronic care follow up. He is followed by Cardiologists  for CHF.   Followed by Oncologists every 3 months for non-small lung cancer. He is currently taking radiation.     He is followed by Pulmonologist every 6 months for COPD. He continues to smoke 1/2 and complaining of increased SOB.  He is followed by Vascular for PAD.    He is followed by Urologists for BPH and Prostate.   He has seen GI for bleed, but stable now.     Has OSA and using CPAP nightly.   Complaining bilateral breast tenderness that started several months ago. States this comes and goes.   He has fallen multiple times this year and fell since our last visit walking on gravel to his car. He continues to have hip pain of 10 out 10. Taking Ultram as needed but caused itching. Then changed to Celebrex.  Hypertension This is a chronic problem. The current episode started more than 1 year ago. The problem has been resolved since onset. The problem is controlled. Associated symptoms include blurred vision and malaise/fatigue. Pertinent negatives include no peripheral edema or shortness of breath. Risk factors for coronary artery disease include dyslipidemia, diabetes mellitus, male gender and sedentary lifestyle. The current treatment provides moderate improvement. Hypertensive end-organ damage includes CAD/MI and heart failure. Identifiable causes of hypertension include a thyroid problem.  Congestive Heart Failure Presents for follow-up visit. Associated symptoms include edema, fatigue and nocturia. Pertinent negatives include no shortness of breath. The symptoms have been stable.  Gastroesophageal Reflux He complains of belching and heartburn. This is a chronic problem. The current episode started more than 1  year ago. The problem occurs occasionally. Associated symptoms include fatigue. He has tried a PPI for the symptoms. The treatment provided moderate relief.  Hyperlipidemia This is a chronic problem. The current episode started more than 1 year ago. The problem is controlled. Recent lipid tests were reviewed and are normal. Exacerbating diseases include obesity. Pertinent negatives include no shortness of breath. Current antihyperlipidemic treatment includes statins. The current treatment provides moderate improvement of lipids. Risk factors for coronary artery disease include dyslipidemia, diabetes mellitus, hypertension, male sex and a sedentary lifestyle.  Diabetes He presents for his follow-up diabetic visit. He has type 2 diabetes mellitus. Associated symptoms include blurred vision, fatigue and foot paresthesias. Symptoms are stable. Diabetic complications include peripheral neuropathy. Risk factors for coronary artery disease include dyslipidemia, diabetes mellitus, hypertension, male sex and sedentary lifestyle. He is following a generally unhealthy diet. His overall blood glucose range is 140-180 mg/dl.  Thyroid Problem Presents for follow-up visit. Symptoms include constipation and fatigue. The symptoms have been stable. His past medical history is significant for heart failure and hyperlipidemia.  Arthritis Presents for follow-up visit. He complains of pain and stiffness. The symptoms have been stable. Affected locations include the right knee, left knee, left MCP and right MCP. His pain is at a severity of 9/10. Associated symptoms include fatigue.  Benign Prostatic Hypertrophy This is a chronic problem. The current episode started more than 1 year ago. The problem has been resolved since onset. Irritative symptoms include nocturia. Past treatments include tamsulosin. The treatment provided moderate relief.  Insomnia Primary symptoms: sleep disturbance, difficulty  falling asleep, frequent  awakening, malaise/fatigue.   The current episode started more than one year. The onset quality is gradual. The problem occurs intermittently. Past treatments include meditation.      Review of Systems  Constitutional:  Positive for fatigue and malaise/fatigue.  Eyes:  Positive for blurred vision.  Respiratory:  Negative for shortness of breath.   Gastrointestinal:  Positive for constipation and heartburn.  Genitourinary:  Positive for nocturia.  Musculoskeletal:  Positive for arthritis and stiffness.  Psychiatric/Behavioral:  Positive for sleep disturbance. The patient has insomnia.   All other systems reviewed and are negative.  Family History  Problem Relation Age of Onset   Hypertension Mother    Diabetes Father    Heart disease Father    Stroke Father    Hypertension Sister    Social History   Socioeconomic History   Marital status: Significant Other    Spouse name: Not on file   Number of children: 5   Years of education: 10   Highest education level: 10th grade  Occupational History   Occupation: Retired  Tobacco Use   Smoking status: Every Day    Current packs/day: 0.50    Average packs/day: 0.5 packs/day for 61.7 years (30.8 ttl pk-yrs)    Types: Cigarettes    Start date: 03/11/1961   Smokeless tobacco: Never   Tobacco comments:    1/2 pack to 1 pack per day 12/14/19  Vaping Use   Vaping status: Never Used  Substance and Sexual Activity   Alcohol use: No   Drug use: No   Sexual activity: Yes    Birth control/protection: None  Other Topics Concern   Not on file  Social History Narrative   [name] is his POA and lives with him. Children out of town - one in [facility], others in [place].   Social Determinants of Health   Financial Resource Strain: Low Risk  (11/19/2022)   Overall Financial Resource Strain (CARDIA)    Difficulty of Paying Living Expenses: Not hard at all  Food Insecurity: No Food Insecurity (11/19/2022)   Hunger Vital Sign    Worried  About Running Out of Food in the Last Year: Never true    Ran Out of Food in the Last Year: Never true  Transportation Needs: No Transportation Needs (11/19/2022)   PRAPARE - Administrator, Civil Service (Medical): No    Lack of Transportation (Non-Medical): No  Physical Activity: Insufficiently Active (11/19/2022)   Exercise Vital Sign    Days of Exercise per Week: 2 days    Minutes of Exercise per Session: 30 min  Stress: No Stress Concern Present (11/19/2022)   [employer] of Occupational Health - Occupational Stress Questionnaire    Feeling of Stress : Not at all  Social Connections: Socially Isolated (11/19/2022)   Social Connection and Isolation Panel [NHANES]    Frequency of Communication with Friends and Family: More than three times a week    Frequency of Social Gatherings with Friends and Family: Never    Attends Religious Services: Never    Database administrator or Organizations: No    Attends Banker Meetings: Never    Marital Status: Divorced       Objective:   Physical Exam Vitals reviewed.  Constitutional:      General: He is not in acute distress.    Appearance: He is well-developed.  HENT:     Head: Normocephalic.     Right Ear:  Tympanic membrane normal.     Left Ear: Tympanic membrane normal.  Eyes:     General:        Right eye: No discharge.        Left eye: No discharge.     Pupils: Pupils are equal, round, and reactive to light.  Neck:     Thyroid: No thyromegaly.  Cardiovascular:     Rate and Rhythm: Normal rate and regular rhythm.     Heart sounds: Murmur heard.  Pulmonary:     Effort: Pulmonary effort is normal. No respiratory distress.     Breath sounds: Wheezing and rhonchi present.  Chest:  Breasts:    Right: Tenderness present.     Left: Tenderness present.  Abdominal:     General: Bowel sounds are normal. There is no distension.     Palpations: Abdomen is soft.     Tenderness: There is no abdominal  tenderness.  Musculoskeletal:        General: Tenderness present.     Cervical back: Normal range of motion and neck supple.     Right lower leg: Edema (trace) present.     Left lower leg: Edema (trace) present.     Comments: Pain in bilateral hip pain with external rotation  Skin:    General: Skin is warm and dry.     Findings: No erythema or rash.  Neurological:     Mental Status: He is alert and oriented to person, place, and time.     Cranial Nerves: No cranial nerve deficit.     Motor: Weakness (using rolling walker) present.     Gait: Gait abnormal.     Deep Tendon Reflexes: Reflexes are normal and symmetric.  Psychiatric:        Behavior: Behavior normal.        Thought Content: Thought content normal.        Judgment: Judgment normal.       BP 138/68   Pulse (!) 113   Temp 97.8 F (36.6 C) (Temporal)   Ht 5\' 6"  (1.676 m)   Wt 150 lb 12.8 oz (68.4 kg)   SpO2 95%   BMI 24.34 kg/m      Assessment & Plan:  JUMAR MILFORD comes in today with chief complaint of Medical Management of Chronic Issues   Diagnosis and orders addressed:  1. Acute on chronic diastolic CHF (congestive heart failure) (HCC)  2. BPH with urinary obstruction  3. Chronic respiratory failure with hypoxia (HCC) - Ambulatory referral to Sleep Studies - For home use only DME 4 wheeled rolling walker with seat ([id number])  4. Chronic obstructive pulmonary disease, unspecified COPD type (HCC) - For home use only DME 4 wheeled rolling walker with seat ([id number])  5. Type 2 diabetes mellitus with diabetic polyneuropathy, without long-term current use of insulin (HCC)  6. Hypertension associated with diabetes (HCC)  7. Hypothyroidism, unspecified type  8. Mixed diabetic hyperlipidemia associated with type 2 diabetes mellitus (HCC)  9. Nicotine dependence, cigarettes, uncomplicated  10. Non-small cell lung cancer, right (HCC)  11. Primary insomnia - Ambulatory referral to Sleep  Studies  12. Primary osteoarthritis involving multiple joints  13. Breast tenderness in male - MM Digital Diagnostic Bilat; Future - US BREAST COMPLETE UNI RIGHT INC AXILLA - US BREAST COMPLETE UNI LEFT INC AXILLA  14. OSA (obstructive sleep apnea) - Ambulatory referral to Sleep Studies  15. Weakness  16. Frequent falls - For home use only DME 4 wheeled rolling  walker with seat ([id number])  17. Bilateral hip pain - Ambulatory referral to Orthopedic Surgery   Labs reviewed from Cardiologists  Stopped pepcid 20 mg, miralax, zanaflex, and vistaril Continue other medications  Referral for mammogram, Ortho for hip pain, and sleep medicine Health Maintenance reviewed Diet and exercise encouraged Approx 48 mins spent with patient, charting, reviewing, and with education.   Follow up plan: 2 months   Jannifer Rodney, FNP

## 2022-11-25 DIAGNOSIS — J449 Chronic obstructive pulmonary disease, unspecified: Secondary | ICD-10-CM | POA: Diagnosis not present

## 2022-11-25 DIAGNOSIS — I4891 Unspecified atrial fibrillation: Secondary | ICD-10-CM | POA: Diagnosis not present

## 2022-11-25 DIAGNOSIS — I110 Hypertensive heart disease with heart failure: Secondary | ICD-10-CM | POA: Diagnosis not present

## 2022-11-25 DIAGNOSIS — I5033 Acute on chronic diastolic (congestive) heart failure: Secondary | ICD-10-CM | POA: Diagnosis not present

## 2022-11-25 DIAGNOSIS — E1142 Type 2 diabetes mellitus with diabetic polyneuropathy: Secondary | ICD-10-CM | POA: Diagnosis not present

## 2022-11-25 DIAGNOSIS — E1151 Type 2 diabetes mellitus with diabetic peripheral angiopathy without gangrene: Secondary | ICD-10-CM | POA: Diagnosis not present

## 2022-11-28 ENCOUNTER — Other Ambulatory Visit: Payer: Self-pay

## 2022-11-28 ENCOUNTER — Telehealth: Payer: Self-pay

## 2022-11-28 DIAGNOSIS — I739 Peripheral vascular disease, unspecified: Secondary | ICD-10-CM

## 2022-11-28 NOTE — Telephone Encounter (Signed)
Returned pt's call-he has c/o LLE pain with tingling and numbness that has caused him to fall. This started about 1-2 weeks ago. He did injure his knee but is not sure if this started around that time. He is concerned about this leg and wants to be seen. He denies rest pain and feels this mainly when walking. Pt has been scheduled to see APP with ABIs. He will call us back if anything changes/worsens.

## 2022-12-01 ENCOUNTER — Other Ambulatory Visit: Payer: Self-pay | Admitting: Family

## 2022-12-01 ENCOUNTER — Telehealth: Payer: Self-pay | Admitting: Nurse Practitioner

## 2022-12-01 DIAGNOSIS — E1142 Type 2 diabetes mellitus with diabetic polyneuropathy: Secondary | ICD-10-CM

## 2022-12-01 DIAGNOSIS — K5900 Constipation, unspecified: Secondary | ICD-10-CM

## 2022-12-01 DIAGNOSIS — I5032 Chronic diastolic (congestive) heart failure: Secondary | ICD-10-CM | POA: Diagnosis not present

## 2022-12-01 NOTE — Telephone Encounter (Signed)
Pt called with low BG readings.   Date Before breakfast Before lunch Before supper Bedtime  12/01/22 69  44   11/30/22    200                Pt taking: Says he's eaten a snack and meals and it goes up a little but then drops back down.

## 2022-12-02 ENCOUNTER — Other Ambulatory Visit: Payer: Self-pay | Admitting: Family

## 2022-12-02 ENCOUNTER — Telehealth: Payer: Self-pay

## 2022-12-02 DIAGNOSIS — E1151 Type 2 diabetes mellitus with diabetic peripheral angiopathy without gangrene: Secondary | ICD-10-CM | POA: Diagnosis not present

## 2022-12-02 DIAGNOSIS — E1142 Type 2 diabetes mellitus with diabetic polyneuropathy: Secondary | ICD-10-CM | POA: Diagnosis not present

## 2022-12-02 DIAGNOSIS — I4891 Unspecified atrial fibrillation: Secondary | ICD-10-CM | POA: Diagnosis not present

## 2022-12-02 DIAGNOSIS — I110 Hypertensive heart disease with heart failure: Secondary | ICD-10-CM | POA: Diagnosis not present

## 2022-12-02 DIAGNOSIS — J449 Chronic obstructive pulmonary disease, unspecified: Secondary | ICD-10-CM | POA: Diagnosis not present

## 2022-12-02 DIAGNOSIS — I5033 Acute on chronic diastolic (congestive) heart failure: Secondary | ICD-10-CM | POA: Diagnosis not present

## 2022-12-02 LAB — BASIC METABOLIC PANEL
CO2: 23 mmol/L (ref 20–29)
Chloride: 96 mmol/L (ref 96–106)
Sodium: 136 mmol/L (ref 134–144)

## 2022-12-02 NOTE — Telephone Encounter (Signed)
Spoke with pt advising him to decrease his insulin to 14 units at bedtime per Dr.Nida's orders. Understanding voiced.

## 2022-12-02 NOTE — Telephone Encounter (Signed)
Pt states he checked his BG last night with a finger stick which resulted 185 and his CGM stated 92. His glucose this morning was 124. Pt taking Tresiba 18 units at bedtime.

## 2022-12-02 NOTE — Telephone Encounter (Addendum)
Called patient regarding results. Left detailed message for patient regarding results. Letter mailed on 11/29/22.----- Message from Joni Reining sent at 12/02/2022  6:59 AM EDT ----- I have reviewed his labs. Blood sugar is elevated but other labs are normal. See PCP for ongoing management of his diabetes.   KL

## 2022-12-04 ENCOUNTER — Other Ambulatory Visit: Payer: Self-pay | Admitting: Family

## 2022-12-04 DIAGNOSIS — M160 Bilateral primary osteoarthritis of hip: Secondary | ICD-10-CM

## 2022-12-09 ENCOUNTER — Ambulatory Visit (INDEPENDENT_AMBULATORY_CARE_PROVIDER_SITE_OTHER): Payer: Medicare Other

## 2022-12-09 DIAGNOSIS — E1151 Type 2 diabetes mellitus with diabetic peripheral angiopathy without gangrene: Secondary | ICD-10-CM | POA: Diagnosis not present

## 2022-12-09 DIAGNOSIS — I4892 Unspecified atrial flutter: Secondary | ICD-10-CM

## 2022-12-09 DIAGNOSIS — J449 Chronic obstructive pulmonary disease, unspecified: Secondary | ICD-10-CM | POA: Diagnosis not present

## 2022-12-09 DIAGNOSIS — E1142 Type 2 diabetes mellitus with diabetic polyneuropathy: Secondary | ICD-10-CM | POA: Diagnosis not present

## 2022-12-09 DIAGNOSIS — I5033 Acute on chronic diastolic (congestive) heart failure: Secondary | ICD-10-CM | POA: Diagnosis not present

## 2022-12-09 DIAGNOSIS — I4891 Unspecified atrial fibrillation: Secondary | ICD-10-CM | POA: Diagnosis not present

## 2022-12-09 DIAGNOSIS — I110 Hypertensive heart disease with heart failure: Secondary | ICD-10-CM | POA: Diagnosis not present

## 2022-12-09 LAB — CUP PACEART REMOTE DEVICE CHECK
Date Time Interrogation Session: 20240902030600
Implantable Pulse Generator Implant Date: 20240126
Pulse Gen Serial Number: 104324

## 2022-12-10 ENCOUNTER — Other Ambulatory Visit (INDEPENDENT_AMBULATORY_CARE_PROVIDER_SITE_OTHER): Payer: Medicare Other

## 2022-12-10 ENCOUNTER — Encounter: Payer: Self-pay | Admitting: Orthopedic Surgery

## 2022-12-10 ENCOUNTER — Ambulatory Visit (INDEPENDENT_AMBULATORY_CARE_PROVIDER_SITE_OTHER): Payer: Medicare Other | Admitting: Orthopedic Surgery

## 2022-12-10 VITALS — BP 138/70 | HR 114 | Ht 66.0 in | Wt 152.8 lb

## 2022-12-10 DIAGNOSIS — M545 Low back pain, unspecified: Secondary | ICD-10-CM

## 2022-12-10 NOTE — Progress Notes (Signed)
New Patient Visit  Assessment: John [name] is a 77 y.o. male with the following: 1. Lumbar pain  Plan: John [name] has lower back pain.  Radiographs demonstrates extensive arthritis throughout the lumbar spine.  No anterolisthesis.  He has bilateral lower extremity neuropathy to at least the mid tibia area.  He did have some pain in the left buttock, but this has improved.  Continue to take Celebrex, and can add Tylenol arthritis.  Continue to work with home health physical therapy.  Consider formal physical therapy.  He is also interested in considering injections for his lumbar spine.  Will place referral for him to be evaluated by Dr. Alvester Morin.  He will follow-up in clinic as needed.   Follow-up: Return for Referral to Dr Alvester Morin to discuss injections.  Subjective:  Chief Complaint  Patient presents with   Back Pain    Down L leg     History of Present Illness: John [name] is a 77 y.o. male who has been referred by Jannifer Rodney, FNP for evaluation of low back pain.  He has progressively worsening lower back pain.  He has been dealing with this for a while.  He refers to his hips, but points to his left buttock.  Recently, he was having a lot of pain in the left buttock, but this has improved.  He has been taking Celebrex.  He does have a history of cancer, and has been working with home health physical therapy recently.  He has multiple falls, and uses a walker to assist with ambulation.  He also has neuropathy in both legs.   Review of Systems: No fevers or chills + numbness or tingling No chest pain No shortness of breath No bowel or bladder dysfunction No GI distress No headaches   Medical History:  Past Medical History:  Diagnosis Date   Adenocarcinoma of lung, right (HCC) 04/18/2016   Anemia    Anxiety    Arthritis    Asthma    Atrial flutter (HCC)    BPH (benign prostatic hyperplasia)    with urinary retention 02/06/20   CHF (congestive heart  failure) (HCC)    COPD (chronic obstructive pulmonary disease) (HCC)    Depression    Diabetes mellitus, type II (HCC)    Dyspnea    History of kidney stones    History of radiation therapy    right lung 09/10/2020-09/17/2020   Dr Roselind Messier   History of radiation therapy    Left and right Lung- 06/19/22-06/26/22   Hyperlipidemia    Hypertension    Hypothyroidism    Macular degeneration    Neuropathy    Non-small cell lung cancer, right (HCC) 04/18/2016   Peripheral vascular disease (HCC)    Prostatitis    Pulmonary nodule, left 07/16/2016   Sleep apnea    cpap    Past Surgical History:  Procedure Laterality Date   A-FLUTTER ABLATION N/A 01/13/2022   Procedure: A-FLUTTER ABLATION;  Surgeon: Lanier Prude, MD;  Location: [facility];  Service: Cardiovascular;  Laterality: N/A;   ABDOMINAL AORTOGRAM W/LOWER EXTREMITY Left 02/06/2020   Procedure: ABDOMINAL AORTOGRAM W/LOWER EXTREMITY;  Surgeon: Runell Gess, MD;  Location: [facility];  Service: Cardiovascular;  Laterality: Left;   ABDOMINAL AORTOGRAM W/LOWER EXTREMITY N/A 05/30/2021   Procedure: ABDOMINAL AORTOGRAM W/LOWER EXTREMITY;  Surgeon: Cephus Shelling, MD;  Location: [facility];  Service: Cardiovascular;  Laterality: N/A;   BIOPSY  09/16/2021   Procedure:  BIOPSY;  Surgeon: Iva Boop, MD;  Location: [facility] ENDOSCOPY;  Service: Gastroenterology;;   CARDIOVERSION N/A 10/05/2020   Procedure: CARDIOVERSION;  Surgeon: Sande Rives, MD;  Location: [facility] ENDOSCOPY;  Service: Cardiovascular;  Laterality: N/A;   CATARACT EXTRACTION, BILATERAL Bilateral    COLONOSCOPY WITH PROPOFOL N/A 06/20/2021   Procedure: COLONOSCOPY WITH PROPOFOL;  Surgeon: Hilarie Fredrickson, MD;  Location: [facility] ENDOSCOPY;  Service: Endoscopy;  Laterality: N/A;   COLONOSCOPY WITH PROPOFOL N/A 09/16/2021   Procedure: COLONOSCOPY WITH PROPOFOL;  Surgeon: Iva Boop, MD;  Location: [facility] ENDOSCOPY;  Service: Gastroenterology;  Laterality:  N/A;   ENDARTERECTOMY FEMORAL Right 08/20/2020   Procedure: ENDARTERECTOMY  RIGHT FEMORAL ARTERY;  Surgeon: Cephus Shelling, MD;  Location: [facility] OR;  Service: Vascular;  Laterality: Right;   ENTEROSCOPY N/A 10/11/2021   Procedure: ENTEROSCOPY;  Surgeon: Dolores Frame, MD;  Location: [facility]O SUITE;  Service: Gastroenterology;  Laterality: N/A;   ESOPHAGOGASTRODUODENOSCOPY N/A 09/16/2021   Procedure: ESOPHAGOGASTRODUODENOSCOPY (EGD);  Surgeon: Iva Boop, MD;  Location: [facility] ENDOSCOPY;  Service: Gastroenterology;  Laterality: N/A;   ESOPHAGOGASTRODUODENOSCOPY (EGD) WITH PROPOFOL N/A 09/06/2020   Procedure: ESOPHAGOGASTRODUODENOSCOPY (EGD) WITH PROPOFOL;  Surgeon: Hilarie Fredrickson, MD;  Location: [facility] ENDOSCOPY;  Service: Endoscopy;  Laterality: N/A;   ESOPHAGOGASTRODUODENOSCOPY (EGD) WITH PROPOFOL N/A 10/11/2021   Procedure: ESOPHAGOGASTRODUODENOSCOPY (EGD) WITH PROPOFOL;  Surgeon: Dolores Frame, MD;  Location: [facility];  Service: Gastroenterology;  Laterality: N/A;   HEMOSTASIS CLIP PLACEMENT  09/16/2021   Procedure: HEMOSTASIS CLIP PLACEMENT;  Surgeon: Iva Boop, MD;  Location: [facility] ENDOSCOPY;  Service: Gastroenterology;;   HOT HEMOSTASIS N/A 09/16/2021   Procedure: HOT HEMOSTASIS (ARGON PLASMA COAGULATION/BICAP);  Surgeon: Iva Boop, MD;  Location: [facility] ENDOSCOPY;  Service: Gastroenterology;  Laterality: N/A;   HOT HEMOSTASIS  10/11/2021   Procedure: HOT HEMOSTASIS (ARGON PLASMA COAGULATION/BICAP);  Surgeon: Marguerita Merles, Reuel Boom, MD;  Location: [facility]O SU[facility];  Service: Gastroenterology;;   INSERTION OF ILIAC STENT Right 08/20/2020   Procedure: RETROGRADE INSERTION OF RIGHT ILIAC STENT;  Surgeon: Cephus Shelling, MD;  Location: [facility] OR;  Service: Vascular;  Laterality: Right;   INTRAOPERATIVE ARTERIOGRAM Right 08/20/2020   Procedure: INTRA OPERATIVE ARTERIOGRAM ILIAC;  Surgeon: Cephus Shelling, MD;  Location: [facility] OR;  Service: Vascular;  Laterality: Right;    PATCH ANGIOPLASTY Right 08/20/2020   Procedure: PATCH ANGIOPLASTY RIGHT FEMORAL ARTERY;  Surgeon: Cephus Shelling, MD;  Location: [facility] OR;  Service: Vascular;  Laterality: Right;   PERIPHERAL VASCULAR BALLOON ANGIOPLASTY Right 05/30/2021   Procedure: PERIPHERAL VASCULAR BALLOON ANGIOPLASTY;  Surgeon: Cephus Shelling, MD;  Location: [facility];  Service: Cardiovascular;  Laterality: Right;   PORTACATH PLACEMENT Left 06/13/2016   Procedure: INSERTION PORT-A-CATH;  Surgeon: Franky Macho, MD;  Location: [facility] ORS;  Service: General;  Laterality: Left;   TRANSURETHRAL RESECTION OF PROSTATE N/A 05/31/2020   Procedure: TRANSURETHRAL RESECTION OF THE PROSTATE (TURP);  Surgeon: Malen Gauze, MD;  Location: [facility] ORS;  Service: Urology;  Laterality: N/A;   VIDEO BRONCHOSCOPY WITH ENDOBRONCHIAL NAVIGATION N/A 05/28/2016   Procedure: VIDEO BRONCHOSCOPY WITH ENDOBRONCHIAL NAVIGATION;  Surgeon: Loreli Slot, MD;  Location: [facility] OR;  Service: Thoracic;  Laterality: N/A;   VIDEO BRONCHOSCOPY WITH ENDOBRONCHIAL ULTRASOUND N/A 05/28/2016   Procedure: VIDEO BRONCHOSCOPY WITH ENDOBRONCHIAL ULTRASOUND;  Surgeon: Loreli Slot, MD;  Location: [facility] OR;  Service: Thoracic;  Laterality: N/A;    Family History  Problem Relation Age of Onset   Hypertension Mother  Diabetes Father    Heart disease Father    Stroke Father    Hypertension Sister    Social History   Tobacco Use   Smoking status: Every Day    Current packs/day: 0.50    Average packs/day: 0.5 packs/day for 61.7 years (30.9 ttl pk-yrs)    Types: Cigarettes    Start date: 03/11/1961   Smokeless tobacco: Never   Tobacco comments:    1/2 pack to 1 pack per day 12/14/19  Vaping Use   Vaping status: Never Used  Substance Use Topics   Alcohol use: No   Drug use: No    Allergies  Allergen Reactions   Sulfa Antibiotics Swelling    Mouth swelling   Atorvastatin Other (See Comments)    Muscle aches - tolerating Pravastatin 40 mg  MWF   Jardiance [Empagliflozin] Other (See Comments)    FEELS SLUGGISH, TIRED   Lopressor [Metoprolol] Other (See Comments)    Fatigue   Rosuvastatin Other (See Comments)    Muscle aches - tolerating Pravastatin 40 mg MWF   Doxycycline Nausea Only   Tramadol Itching   Temazepam Other (See Comments)    Made insomnia worse     Current Meds  Medication Sig   allopurinol (ZYLOPRIM) 300 MG tablet Take 1 tablet (300 mg total) by mouth daily.   aspirin EC 81 MG tablet Take 1 tablet (81 mg total) by mouth daily. Swallow whole.   Blood Glucose Monitoring Suppl (ONETOUCH VERIO REFLECT) w/Device KIT Use to test blood sugars daily as directed. DX: E11.9   celecoxib (CELEBREX) 200 MG capsule Take 1 capsule (200 mg total) by mouth daily.   cetirizine (ZYRTEC ALLERGY) 10 MG tablet Take 1 tablet (10 mg total) by mouth daily.   Continuous Glucose Receiver (FREESTYLE LIBRE 3 READER) DEVI 1 Device by Does not apply route every 14 (fourteen) days.   Continuous Glucose Sensor (FREESTYLE LIBRE 3 SENSOR) MISC 1 Device by Does not apply route continuous.   dapagliflozin propanediol (FARXIGA) 5 MG TABS tablet Take 1 tablet (5 mg total) by mouth daily before breakfast.   dutasteride (AVODART) 0.5 MG capsule Take 1 capsule (0.5 mg total) by mouth daily.   ezetimibe (ZETIA) 10 MG tablet TAKE 1 TABLET BY MOUTH EVERY DAY   fluticasone (FLONASE) 50 MCG/ACT nasal spray PLACE 1 SPRAY INTO BOTH NOSTRILS DAILY AS NEEDED FOR ALLERGIES OR RHINITIS.   Fluticasone-Umeclidin-Vilant (TRELEGY ELLIPTA) 100-62.5-25 MCG/ACT AEPB Inhale 1 puff into the lungs daily.   gabapentin (NEURONTIN) 300 MG capsule TAKE 1 CAPSULE BY MOUTH THREE TIMES A DAY   insulin degludec (TRESIBA FLEXTOUCH) 200 UNIT/ML FlexTouch Pen Inject 18 Units into the skin every evening.   Insulin Pen Needle (PEN NEEDLES) 32G X 4 MM MISC 1 Application by Does not apply route 3 (three) times daily.   KLOR-CON M20 20 MEQ tablet TAKE 1 TABLET BY MOUTH EVERY DAY    levothyroxine (SYNTHROID) 50 MCG tablet TAKE 1 TABLET BY MOUTH EVERY DAY   LINZESS 72 MCG capsule TAKE 1 CAPSULE BY MOUTH DAILY BEFORE BREAKFAST.   metoprolol succinate (TOPROL XL) 25 MG 24 hr tablet Take 0.5 tablets (12.5 mg total) by mouth daily.   NON FORMULARY CPAP at bedtime   ondansetron (ZOFRAN) 4 MG tablet Take 1 tablet (4 mg total) by mouth every 8 (eight) hours as needed for nausea or vomiting.   OneTouch Delica Lancets 30G MISC Use to test blood sugars daily as directed. DX: E11.9   OXYGEN Inhale 2 L  into the lungs daily as needed (with Cpap at night).   pantoprazole (PROTONIX) 20 MG tablet TAKE 1 TABLET BY MOUTH EVERY DAY   pravastatin (PRAVACHOL) 40 MG tablet TAKE 1 TABLET (40 MG TOTAL) BY MOUTH ON MONDAYS , WEDNESDAYS, AND FRIDAYS AS DIRECTED   spironolactone (ALDACTONE) 25 MG tablet TAKE 1 TABLET (25 MG TOTAL) BY MOUTH DAILY.   torsemide (DEMADEX) 20 MG tablet Take 1 tablet (20 mg total) by mouth daily.   zolpidem (AMBIEN) 5 MG tablet Take 1 tablet (5 mg total) by mouth at bedtime as needed for sleep.    Objective: BP 138/70   Pulse (!) 114   Ht 5\' 6"  (1.676 m)   Wt 152 lb 12.8 oz (69.3 kg)   BMI 24.66 kg/m   Physical Exam:  General: Elderly male., Alert and oriented., and No acute distress. Gait: Ambulates with the assistance of a walker.  Evaluation lower spine demonstrates no deformity.  No redness.  No point tenderness.  Negative straight leg raise bilaterally.  Decreased sensation bilateral lower extremities from the mid tibia, distal.  Atrophy of the quadriceps bilaterally.  Multiple healing abrasions over the lower extremities.  4+/5 strength in the quadriceps.  Otherwise, the lower body strength is pretty good.  IMAGING: I personally ordered and reviewed the following images  Standing lumbar spine x-rays were obtained in clinic today.  No acute injuries are noted.  Diffuse degenerative changes.  He has loss of disc height at multiple levels, with anterior  bridging osteophytes.  No evidence of anterolisthesis.  No bony lesions.  Impression: Lumbar spine x-rays with diffuse degenerative changes.   New Medications:  No orders of the defined types were placed in this encounter.     Oliver Barre, MD  12/10/2022 1:50 PM

## 2022-12-11 ENCOUNTER — Ambulatory Visit: Payer: Medicare Other

## 2022-12-11 ENCOUNTER — Ambulatory Visit (HOSPITAL_COMMUNITY): Payer: Medicare Other

## 2022-12-12 DIAGNOSIS — E1159 Type 2 diabetes mellitus with other circulatory complications: Secondary | ICD-10-CM | POA: Diagnosis not present

## 2022-12-12 DIAGNOSIS — Z556 Problems related to health literacy: Secondary | ICD-10-CM | POA: Diagnosis not present

## 2022-12-12 DIAGNOSIS — J449 Chronic obstructive pulmonary disease, unspecified: Secondary | ICD-10-CM | POA: Diagnosis not present

## 2022-12-12 DIAGNOSIS — K219 Gastro-esophageal reflux disease without esophagitis: Secondary | ICD-10-CM | POA: Diagnosis not present

## 2022-12-12 DIAGNOSIS — N138 Other obstructive and reflux uropathy: Secondary | ICD-10-CM | POA: Diagnosis not present

## 2022-12-12 DIAGNOSIS — Z87891 Personal history of nicotine dependence: Secondary | ICD-10-CM | POA: Diagnosis not present

## 2022-12-12 DIAGNOSIS — E782 Mixed hyperlipidemia: Secondary | ICD-10-CM | POA: Diagnosis not present

## 2022-12-12 DIAGNOSIS — I480 Paroxysmal atrial fibrillation: Secondary | ICD-10-CM | POA: Diagnosis not present

## 2022-12-12 DIAGNOSIS — I4892 Unspecified atrial flutter: Secondary | ICD-10-CM | POA: Diagnosis not present

## 2022-12-12 DIAGNOSIS — I6521 Occlusion and stenosis of right carotid artery: Secondary | ICD-10-CM | POA: Diagnosis not present

## 2022-12-12 DIAGNOSIS — I152 Hypertension secondary to endocrine disorders: Secondary | ICD-10-CM | POA: Diagnosis not present

## 2022-12-12 DIAGNOSIS — R338 Other retention of urine: Secondary | ICD-10-CM | POA: Diagnosis not present

## 2022-12-12 DIAGNOSIS — E039 Hypothyroidism, unspecified: Secondary | ICD-10-CM | POA: Diagnosis not present

## 2022-12-12 DIAGNOSIS — E1169 Type 2 diabetes mellitus with other specified complication: Secondary | ICD-10-CM | POA: Diagnosis not present

## 2022-12-12 DIAGNOSIS — I5033 Acute on chronic diastolic (congestive) heart failure: Secondary | ICD-10-CM | POA: Diagnosis not present

## 2022-12-12 DIAGNOSIS — Z794 Long term (current) use of insulin: Secondary | ICD-10-CM | POA: Diagnosis not present

## 2022-12-12 DIAGNOSIS — F5101 Primary insomnia: Secondary | ICD-10-CM | POA: Diagnosis not present

## 2022-12-12 DIAGNOSIS — Z9981 Dependence on supplemental oxygen: Secondary | ICD-10-CM | POA: Diagnosis not present

## 2022-12-12 DIAGNOSIS — C3491 Malignant neoplasm of unspecified part of right bronchus or lung: Secondary | ICD-10-CM | POA: Diagnosis not present

## 2022-12-12 DIAGNOSIS — N401 Enlarged prostate with lower urinary tract symptoms: Secondary | ICD-10-CM | POA: Diagnosis not present

## 2022-12-12 DIAGNOSIS — Z9181 History of falling: Secondary | ICD-10-CM | POA: Diagnosis not present

## 2022-12-12 DIAGNOSIS — E1151 Type 2 diabetes mellitus with diabetic peripheral angiopathy without gangrene: Secondary | ICD-10-CM | POA: Diagnosis not present

## 2022-12-12 DIAGNOSIS — E1142 Type 2 diabetes mellitus with diabetic polyneuropathy: Secondary | ICD-10-CM | POA: Diagnosis not present

## 2022-12-12 DIAGNOSIS — J9611 Chronic respiratory failure with hypoxia: Secondary | ICD-10-CM | POA: Diagnosis not present

## 2022-12-12 DIAGNOSIS — D500 Iron deficiency anemia secondary to blood loss (chronic): Secondary | ICD-10-CM | POA: Diagnosis not present

## 2022-12-16 ENCOUNTER — Ambulatory Visit (HOSPITAL_COMMUNITY)
Admission: RE | Admit: 2022-12-16 | Discharge: 2022-12-16 | Disposition: A | Discharge status: Home / Self Care | Payer: Medicare Other | Source: Ambulatory Visit | Attending: Hematology | Admitting: Hematology

## 2022-12-16 ENCOUNTER — Encounter (HOSPITAL_COMMUNITY): Payer: Self-pay | Admitting: Radiology

## 2022-12-16 DIAGNOSIS — C3491 Malignant neoplasm of unspecified part of right bronchus or lung: Secondary | ICD-10-CM | POA: Diagnosis not present

## 2022-12-16 DIAGNOSIS — I700 Atherosclerosis of aorta: Secondary | ICD-10-CM | POA: Diagnosis not present

## 2022-12-16 DIAGNOSIS — C3490 Malignant neoplasm of unspecified part of unspecified bronchus or lung: Secondary | ICD-10-CM | POA: Diagnosis not present

## 2022-12-16 MED ORDER — IOHEXOL 300 MG/ML  SOLN
75.0000 mL | Freq: Once | INTRAMUSCULAR | Status: AC | PRN
  Administered 2022-12-16: 75 mL via INTRAVENOUS

## 2022-12-16 NOTE — Progress Notes (Signed)
[employer] 

## 2022-12-17 ENCOUNTER — Telehealth: Payer: Self-pay | Admitting: *Deleted

## 2022-12-17 ENCOUNTER — Encounter: Payer: Self-pay | Admitting: Physical Medicine and Rehabilitation

## 2022-12-17 ENCOUNTER — Ambulatory Visit (INDEPENDENT_AMBULATORY_CARE_PROVIDER_SITE_OTHER): Payer: Medicare Other | Admitting: Physical Medicine and Rehabilitation

## 2022-12-17 DIAGNOSIS — M5442 Lumbago with sciatica, left side: Secondary | ICD-10-CM

## 2022-12-17 DIAGNOSIS — M5441 Lumbago with sciatica, right side: Secondary | ICD-10-CM

## 2022-12-17 DIAGNOSIS — G8929 Other chronic pain: Secondary | ICD-10-CM | POA: Diagnosis not present

## 2022-12-17 DIAGNOSIS — M5416 Radiculopathy, lumbar region: Secondary | ICD-10-CM | POA: Diagnosis not present

## 2022-12-17 DIAGNOSIS — R269 Unspecified abnormalities of gait and mobility: Secondary | ICD-10-CM | POA: Diagnosis not present

## 2022-12-17 NOTE — Telephone Encounter (Signed)
Patient left a voicemail that he is having trouble with his monitor acting up. I talked with the patient. He shares that when he does a finger stick and he gets his CGM readings there is a big difference. He did have a Scan yesterday and his sensor was removed, he replaced the sensor but it is not paring with his CGM. He did a finger stick after his lunch and his blood sugar was 272.On August 26 patient states that he called and then advised to lower his Tresiba to 14 units at bedtime. Still need to verify the Faxiga. The only new medication is , the Celecoxib 200 mg that he is taking daily for pain, he shares. Patient was ask to finger stick before supper , bedtime, breakfast and lunch , until he has a nurse visit tomorrow at 2 pm. He was ask to bring his meter, CGM, sensor and his readings for review. Also , we were unable to download his Dexcom CGM,last connected 09/2022.

## 2022-12-17 NOTE — Progress Notes (Signed)
CURRY CONK - 77 y.o. male MRN [id number]  Date of birth: [birth year]  Office Visit Note: Visit Date: 12/17/2022 PCP: Junie Spencer, FNP Referred by: Junie Spencer, FNP  Subjective: Chief Complaint  Patient presents with  . Left Hip - Fracture, Pain    Left hip pain and numbness.   HPI: John [name] is a 77 y.o. male who comes in today per the request of Dr. Thane Edu for evaluation of chronic, worsening and severe bilateral lower back pain radiating to buttocks/hips and down legs. Numbness/tingling to bilateral legs down to feet. His pain seems to be more severe on the left. Patient is somewhat of poor historian, wife is accompanying him during our visit today. Pain ongoing for several months, worsens with standing and walking. He describes pain as sore, aching and weak sensation. Some relief of pain with home based physical therapy, rest and use of medications. Some relief of pain with Celebrex. Recent lumbar x-rays exhibits disc height loss at multiple levels, no spondylolisthesis, no fractures/dislocations. No history of lumbar surgery/injections. History of multiple falls. Patient currently using rolling walker to assist with ambulation. No recent trauma or falls.   He has significant medical history including CHF, PAD, atrial flutter, COPD, diabetes mellitus, neuropathy, lung cancer and tobacco abuse. He appears very frail today. He does have multiple allergies/intolerances to medications including Tramadol.     Review of Systems  Musculoskeletal:  Positive for back pain.  Neurological:  Positive for tingling and weakness.  All other systems reviewed and are negative.  Otherwise per HPI.  Assessment & Plan: Visit Diagnoses:    ICD-10-CM   1. Lumbar radiculopathy  M54.16 MR LUMBAR SPINE WO CONTRAST    2. Chronic bilateral low back pain with bilateral sciatica  M54.42 MR LUMBAR SPINE WO CONTRAST   M54.41    G89.29     3. Gait abnormality  R26.9 MR LUMBAR SPINE  WO CONTRAST       Plan: Findings:  Chronic, worsening and severe bilateral lower back pain radiating to buttocks/hips and down legs. Paresthesias to bilateral legs/feet. Patient continues to have severe pain despite good conservative therapies such as home physical therapy, rest and use of medications. Patients clinical presentation and exam are consistent with lumbar radiculopathy, his pain does not follow specific dermatomal distribution. I am concerned this could be more of neurogenic claudication from spinal canal stenosis. Next step is to place order for lumbar MRI imaging. Depending on results of imaging we discussed possibility of performing lumbar epidural steroid injection. Patient has no questions at this time. No red flag symptoms noted upon exam today.     Meds & Orders: No orders of the defined types were placed in this encounter.   Orders Placed This Encounter  Procedures  . MR LUMBAR SPINE WO CONTRAST    Follow-up: Return for follow up for lumbar MRI imaging.   Procedures: No procedures performed      Clinical History: Standing lumbar spine x-rays were obtained in clinic today.  No acute injuries are noted.  Diffuse degenerative changes.  He has loss of disc height at multiple levels, with anterior bridging osteophytes.  No evidence of anterolisthesis.  No bony lesions.   Impression: Lumbar spine x-rays with diffuse degenerative changes.   He reports that he has been smoking cigarettes. He started smoking about 61 years ago. He has a 30.9 pack-year smoking history. He has never used smokeless tobacco.  Recent Labs    01/28/22 1242  02/21/22 1614 06/20/22 1153 09/15/22 1013  HGBA1C 7.8*  --  8.5* 8.2*  LABURIC  --  12.6*  --   --     Objective:  VS:  HT:    WT:   BMI:     BP:   HR: bpm  TEMP: ( )  RESP:  Physical Exam Vitals and nursing note reviewed.  HENT:     Head: Normocephalic and atraumatic.     Right Ear: External ear normal.     Left Ear: External  ear normal.     Nose: Nose normal.     Mouth/Throat:     Mouth: Mucous membranes are moist.  Eyes:     Extraocular Movements: Extraocular movements intact.  Cardiovascular:     Rate and Rhythm: Normal rate.     Pulses: Normal pulses.  Pulmonary:     Effort: Pulmonary effort is normal.  Abdominal:     General: Abdomen is flat. There is no distension.  Musculoskeletal:        General: Tenderness present.     Cervical back: Normal range of motion.     Comments: Patient is slow to rise from seated position to standing. Good lumbar range of motion. No pain noted with facet loading. 5/5 strength noted with bilateral hip flexion, knee flexion/extension, ankle dorsiflexion/plantarflexion and EHL. No clonus noted bilaterally. No pain upon palpation of greater trochanters. No pain with internal/external rotation of bilateral hips. Sensation intact bilaterally. Negative slump test bilaterally. Ambulates with rolling walker, gait slow and unsteady.     Skin:    General: Skin is warm and dry.     Capillary Refill: Capillary refill takes less than 2 seconds.  Neurological:     General: No focal deficit present.     Mental Status: He is alert and oriented to person, place, and time.  Psychiatric:        Mood and Affect: Mood normal.        Behavior: Behavior normal.    Ortho Exam  Imaging: No results found.  Past Medical/Family/Surgical/Social History: Medications & Allergies reviewed per EMR, new medications updated. Patient Active Problem List   Diagnosis Date Noted  . Lethargy 10/10/2021  . Heme positive stool   . Rash 10/09/2021  . GIB (gastrointestinal bleeding) 09/14/2021  . Acute on chronic heart failure with preserved ejection fraction (HFpEF) (HCC) 09/13/2021  . Paroxysmal atrial flutter (HCC) 08/25/2021  . GERD without esophagitis 08/25/2021  . Chronic respiratory failure with hypoxia (HCC) 08/25/2021  . Carotid stenosis, right 08/25/2021  . Iron deficiency anemia   .  Positive colorectal cancer screening using Cologuard test   . Angiodysplasia of cecum   . Elevated troponin level not due myocardial infarction   . History of atrial fibrillation 10/09/2020  . Constipation 09/07/2020  . Acute gastric ulcer without hemorrhage or perforation   . GI bleed 09/05/2020  . Atrial flutter (HCC) 09/05/2020  . Acute on chronic anemia 09/05/2020  . Hypokalemia 05/06/2020  . Hypoalbuminemia 05/06/2020  . Lactic acidosis 05/06/2020  . Tobacco abuse 05/06/2020  . Urinary retention 03/09/2020  . Hypoxia   . COPD GOLD III/ group D still smoking  10/24/2019  . PAD (peripheral artery disease) (HCC) 09/13/2019  . Obstructive sleep apnea treated with continuous positive airway pressure (CPAP) 05/31/2018  . Diabetic neuropathy (HCC) 12/29/2017  . Nicotine dependence, cigarettes, uncomplicated 12/29/2017  . Mixed diabetic hyperlipidemia associated with type 2 diabetes mellitus (HCC) 12/29/2017  . Primary insomnia 12/29/2017  . Primary  osteoarthritis involving multiple joints 12/29/2017  . Hypothyroidism 06/09/2017  . Joint pain 08/05/2016  . Hyponatremia 08/05/2016  . Pulmonary nodule, left 07/16/2016  . Acute on chronic diastolic CHF (congestive heart failure) (HCC) 07/09/2016  . Diabetes mellitus (HCC) 07/09/2016  . Non-small cell lung cancer, right (HCC) 04/18/2016  . Hypertension associated with diabetes (HCC) 02/01/2016  . COPD (chronic obstructive pulmonary disease) (HCC) 02/01/2016  . BPH with urinary obstruction 02/01/2016   Past Medical History:  Diagnosis Date  . Adenocarcinoma of lung, right (HCC) 04/18/2016  . Anemia   . Anxiety   . Arthritis   . Asthma   . Atrial flutter (HCC)   . BPH (benign prostatic hyperplasia)    with urinary retention 02/06/20  . CHF (congestive heart failure) (HCC)   . COPD (chronic obstructive pulmonary disease) (HCC)   . Depression   . Diabetes mellitus, type II (HCC)   . Dyspnea   . History of kidney stones   .  History of radiation therapy    right lung 09/10/2020-09/17/2020   Dr Roselind Messier  . History of radiation therapy    Left and right Lung- 06/19/22-06/26/22  . Hyperlipidemia   . Hypertension   . Hypothyroidism   . Macular degeneration   . Neuropathy   . Non-small cell lung cancer, right (HCC) 04/18/2016  . Peripheral vascular disease (HCC)   . Prostatitis   . Pulmonary nodule, left 07/16/2016  . Sleep apnea    cpap   Family History  Problem Relation Age of Onset  . Hypertension Mother   . Diabetes Father   . Heart disease Father   . Stroke Father   . Hypertension Sister    Past Surgical History:  Procedure Laterality Date  . A-FLUTTER ABLATION N/A 01/13/2022   Procedure: A-FLUTTER ABLATION;  Surgeon: Lanier Prude, MD;  Location: [facility] INVASIVE CV LAB;  Service: Cardiovascular;  Laterality: N/A;  . ABDOMINAL AORTOGRAM W/LOWER EXTREMITY Left 02/06/2020   Procedure: ABDOMINAL AORTOGRAM W/LOWER EXTREMITY;  Surgeon: Runell Gess, MD;  Location: [facility] INVASIVE CV LAB;  Service: Cardiovascular;  Laterality: Left;  . ABDOMINAL AORTOGRAM W/LOWER EXTREMITY N/A 05/30/2021   Procedure: ABDOMINAL AORTOGRAM W/LOWER EXTREMITY;  Surgeon: Cephus Shelling, MD;  Location: [facility] INVASIVE CV LAB;  Service: Cardiovascular;  Laterality: N/A;  . BIOPSY  09/16/2021   Procedure: BIOPSY;  Surgeon: Iva Boop, MD;  Location: [facility] ENDOSCOPY;  Service: Gastroenterology;;  . CARDIOVERSION N/A 10/05/2020   Procedure: CARDIOVERSION;  Surgeon: Sande Rives, MD;  Location: [facility] ENDOSCOPY;  Service: Cardiovascular;  Laterality: N/A;  . CATARACT EXTRACTION, BILATERAL Bilateral   . COLONOSCOPY WITH PROPOFOL N/A 06/20/2021   Procedure: COLONOSCOPY WITH PROPOFOL;  Surgeon: Hilarie Fredrickson, MD;  Location: [facility] ENDOSCOPY;  Service: Endoscopy;  Laterality: N/A;  . COLONOSCOPY WITH PROPOFOL N/A 09/16/2021   Procedure: COLONOSCOPY WITH PROPOFOL;  Surgeon: Iva Boop, MD;  Location: [facility] ENDOSCOPY;  Service:  Gastroenterology;  Laterality: N/A;  . ENDARTERECTOMY FEMORAL Right 08/20/2020   Procedure: ENDARTERECTOMY  RIGHT FEMORAL ARTERY;  Surgeon: Cephus Shelling, MD;  Location: [facility] OR;  Service: Vascular;  Laterality: Right;  . ENTEROSCOPY N/A 10/11/2021   Procedure: ENTEROSCOPY;  Surgeon: Dolores Frame, MD;  Location: [facility] ENDO SUITE;  Service: Gastroenterology;  Laterality: N/A;  . ESOPHAGOGASTRODUODENOSCOPY N/A 09/16/2021   Procedure: ESOPHAGOGASTRODUODENOSCOPY (EGD);  Surgeon: Iva Boop, MD;  Location: [facility] ENDOSCOPY;  Service: Gastroenterology;  Laterality: N/A;  . ESOPHAGOGASTRODUODENOSCOPY (EGD) WITH PROPOFOL N/A 09/06/2020   Procedure: ESOPHAGOGASTRODUODENOSCOPY (EGD)  WITH PROPOFOL;  Surgeon: Hilarie Fredrickson, MD;  Location: [facility] ENDOSCOPY;  Service: Endoscopy;  Laterality: N/A;  . ESOPHAGOGASTRODUODENOSCOPY (EGD) WITH PROPOFOL N/A 10/11/2021   Procedure: ESOPHAGOGASTRODUODENOSCOPY (EGD) WITH PROPOFOL;  Surgeon: Dolores Frame, MD;  Location: [facility] ENDO SUITE;  Service: Gastroenterology;  Laterality: N/A;  . HEMOSTASIS CLIP PLACEMENT  09/16/2021   Procedure: HEMOSTASIS CLIP PLACEMENT;  Surgeon: Iva Boop, MD;  Location: [facility] ENDOSCOPY;  Service: Gastroenterology;;  . HOT HEMOSTASIS N/A 09/16/2021   Procedure: HOT HEMOSTASIS (ARGON PLASMA COAGULATION/BICAP);  Surgeon: Iva Boop, MD;  Location: [facility] ENDOSCOPY;  Service: Gastroenterology;  Laterality: N/A;  . HOT HEMOSTASIS  10/11/2021   Procedure: HOT HEMOSTASIS (ARGON PLASMA COAGULATION/BICAP);  Surgeon: Marguerita Merles, Reuel Boom, MD;  Location: [facility] ENDO SUITE;  Service: Gastroenterology;;  . INSERTION OF ILIAC STENT Right 08/20/2020   Procedure: RETROGRADE INSERTION OF RIGHT ILIAC STENT;  Surgeon: Cephus Shelling, MD;  Location: [facility] OR;  Service: Vascular;  Laterality: Right;  . INTRAOPERATIVE ARTERIOGRAM Right 08/20/2020   Procedure: INTRA OPERATIVE ARTERIOGRAM ILIAC;  Surgeon: Cephus Shelling, MD;  Location: [facility] OR;   Service: Vascular;  Laterality: Right;  . PATCH ANGIOPLASTY Right 08/20/2020   Procedure: PATCH ANGIOPLASTY RIGHT FEMORAL ARTERY;  Surgeon: Cephus Shelling, MD;  Location: [facility] OR;  Service: Vascular;  Laterality: Right;  . PERIPHERAL VASCULAR BALLOON ANGIOPLASTY Right 05/30/2021   Procedure: PERIPHERAL VASCULAR BALLOON ANGIOPLASTY;  Surgeon: Cephus Shelling, MD;  Location: [facility] INVASIVE CV LAB;  Service: Cardiovascular;  Laterality: Right;  . PORTACATH PLACEMENT Left 06/13/2016   Procedure: INSERTION PORT-A-CATH;  Surgeon: Franky Macho, MD;  Location: [facility] ORS;  Service: General;  Laterality: Left;  . TRANSURETHRAL RESECTION OF PROSTATE N/A 05/31/2020   Procedure: TRANSURETHRAL RESECTION OF THE PROSTATE (TURP);  Surgeon: Malen Gauze, MD;  Location: [facility] ORS;  Service: Urology;  Laterality: N/A;  . VIDEO BRONCHOSCOPY WITH ENDOBRONCHIAL NAVIGATION N/A 05/28/2016   Procedure: VIDEO BRONCHOSCOPY WITH ENDOBRONCHIAL NAVIGATION;  Surgeon: Loreli Slot, MD;  Location: [facility] OR;  Service: Thoracic;  Laterality: N/A;  . VIDEO BRONCHOSCOPY WITH ENDOBRONCHIAL ULTRASOUND N/A 05/28/2016   Procedure: VIDEO BRONCHOSCOPY WITH ENDOBRONCHIAL ULTRASOUND;  Surgeon: Loreli Slot, MD;  Location: [facility] OR;  Service: Thoracic;  Laterality: N/A;   Social History   Occupational History  . Occupation: Retired  Tobacco Use  . Smoking status: Every Day    Current packs/day: 0.50    Average packs/day: 0.5 packs/day for 61.8 years (30.9 ttl pk-yrs)    Types: Cigarettes    Start date: 03/11/1961  . Smokeless tobacco: Never  . Tobacco comments:    1/2 pack to 1 pack per day 12/14/19  Vaping Use  . Vaping status: Never Used  Substance and Sexual Activity  . Alcohol use: No  . Drug use: No  . Sexual activity: Yes    Birth control/protection: None

## 2022-12-17 NOTE — Progress Notes (Signed)
Functional Pain Scale - descriptive words and definitions  Uncomfortable (3)  Pain is present but can complete all ADL's/sleep is slightly affected and passive distraction only gives marginal relief. Mild range order  Having numbness I left leg/hip area. No pain at all today.  Average Pain 3

## 2022-12-18 ENCOUNTER — Ambulatory Visit: Payer: Medicare Other

## 2022-12-18 VITALS — BP 126/78 | Ht 66.0 in | Wt 152.0 lb

## 2022-12-18 DIAGNOSIS — Z794 Long term (current) use of insulin: Secondary | ICD-10-CM

## 2022-12-18 DIAGNOSIS — E1165 Type 2 diabetes mellitus with hyperglycemia: Secondary | ICD-10-CM

## 2022-12-18 NOTE — Progress Notes (Signed)
Pt brought in his Dexcom G7 receiver and sensors. He had questions concerning in he was applying the sensors correctly. He was having pairing issues between the sensor and receiver. Checked this on his receiver and it did not pair. We placed another sensor on his L arm and this one paired with the receiver. His wife also watched the application process of the sensor to assure she is doing this correctly. Pt may have had a faulty sensor.

## 2022-12-21 ENCOUNTER — Ambulatory Visit (HOSPITAL_COMMUNITY)
Admission: RE | Admit: 2022-12-21 | Discharge: 2022-12-21 | Disposition: A | Discharge status: Home / Self Care | Payer: Medicare Other | Source: Ambulatory Visit | Attending: Physical Medicine and Rehabilitation | Admitting: Physical Medicine and Rehabilitation

## 2022-12-21 DIAGNOSIS — M5416 Radiculopathy, lumbar region: Secondary | ICD-10-CM | POA: Insufficient documentation

## 2022-12-21 DIAGNOSIS — M5441 Lumbago with sciatica, right side: Secondary | ICD-10-CM | POA: Insufficient documentation

## 2022-12-21 DIAGNOSIS — M4726 Other spondylosis with radiculopathy, lumbar region: Secondary | ICD-10-CM | POA: Diagnosis not present

## 2022-12-21 DIAGNOSIS — M48061 Spinal stenosis, lumbar region without neurogenic claudication: Secondary | ICD-10-CM | POA: Diagnosis not present

## 2022-12-21 DIAGNOSIS — M5442 Lumbago with sciatica, left side: Secondary | ICD-10-CM | POA: Insufficient documentation

## 2022-12-21 DIAGNOSIS — R269 Unspecified abnormalities of gait and mobility: Secondary | ICD-10-CM | POA: Diagnosis not present

## 2022-12-21 DIAGNOSIS — M5115 Intervertebral disc disorders with radiculopathy, thoracolumbar region: Secondary | ICD-10-CM | POA: Diagnosis not present

## 2022-12-21 DIAGNOSIS — M4807 Spinal stenosis, lumbosacral region: Secondary | ICD-10-CM | POA: Diagnosis not present

## 2022-12-21 DIAGNOSIS — G8929 Other chronic pain: Secondary | ICD-10-CM | POA: Diagnosis not present

## 2022-12-22 NOTE — Progress Notes (Signed)
[facility] [address], [place] [address]    Clinic Day:  12/22/2022  Referring physician: Junie Spencer, FNP  Patient Care Team: Junie Spencer, FNP as PCP - General (Family Medicine) O'Neal, Ronnald Ramp, MD as PCP - Cardiology (Cardiology) Lanier Prude, MD as PCP - Electrophysiology (Cardiology) Doreatha Massed, MD as Consulting Physician (Hematology) Danella Maiers, [facility] (Pharmacist) O'Neal, Ronnald Ramp, MD as Consulting Physician (Cardiology) McKenzie, Mardene Celeste, MD as Consulting Physician (Urology) Delora Fuel, OD (Optometry) Dani Gobble, NP as Nurse Practitioner (Endocrinology) Adam Phenix, DPM as Consulting Physician (Podiatry) Cephus Shelling, MD as Consulting Physician (Vascular Surgery)   ASSESSMENT & PLAN:   Assessment: 1.  Stage IIIa (T1b N2 cM0) NSCLC, favoring adenocarcinoma: -Chemo XRT with weekly carboplatin and paclitaxel completed on 08/20/2016. -Consolidation durvalumab from 09/26/2016 through 09/25/2017. -Reviewed CT chest on 11/07/2019 which showed increasing size of the upper lobe pulmonary nodules with no new nodule in the left upper lobe suspicious for meta stasis.  Also increasing confluent soft tissue in the right suprahilar region, thought to be postradiation changes. -PET scan on 11/21/2019 showed suspected radiation changes in the right paramediastinal/perihilar region although mild hypermetabolism is present in the right retroareolar region.SUV 2.5.  While progressive soft tissue is present in this region, this hypermetabolism remains nonfocal and favors radiation changes.  7 mm central left upper lobe nodule max SUV 1.4.  9 mm aggregate nodule in the lateral right upper lobe max SUV 2.4.  These are minimally progressive from more remote prior scans.  6 mm left lower lobe nodule unchanged.  No hypermetabolic thoracic adenopathy. -CT chest on 03/23/2020 showed many of the lung nodules are stable.  Couple  of nodules have grown by few millimeters.  No new areas seen. - SBRT to the right and left lung lesions, 54 Gray in 3 fractions from 09/10/2020 through 09/17/2020. - SBRT to the RUL and LUL lesions from 06/19/2022 through 06/26/2022 by Dr. Roselind Messier.   2.  Normocytic to macrocytic anemia: - Found to have hemoglobin 6.3 on 10/09/2020 and received 2 units PRBC while hospitalized.  I have reviewed hospitalization records. - EGD on 09/06/2020 with tiny gastric ulcer without bleeding or stigmata. - Blood loss this year most likely postoperative in nature (urological surgery in February and vascular surgery in May).    Plan: 1.  Stage IIIa (T1b N2 cM0) NSCLC, favoring adenocarcinoma: - He has completed SBRT of the RUL and LUL nodules on 06/26/2022. - Reviewed CT chest (09/19/2022): Interval decrease size of RUL and LUL.  Stable LUL postradiation change.  Stable right perihilar postradiation change.  New small solid nodule of the LLL measuring 5 mm. - Recommend RTC 3 months with repeat CT of the chest.   2.  Hypothyroidism: - TSH is 2.178.  Continue same dose of Synthroid daily.   3.  Difficulty staying asleep: - Continue temazepam as needed.  4.  Normocytic to macrocytic anemia: - Hemoglobin is 13.0.  Continue iron tablet daily.    No orders of the defined types were placed in this encounter.     [name],acting as a Neurosurgeon for Doreatha Massed, MD.,have documented all relevant documentation on the behalf of Doreatha Massed, MD,as directed by  Doreatha Massed, MD while in the presence of Doreatha Massed, MD.  ***   John [name]   9/16/20249:52 PM  CHIEF COMPLAINT:   Diagnosis: right NSCLC    Cancer Staging  Non-small cell lung cancer,  right [facility]) Staging form: Lung, AJCC 8th Edition - Clinical stage from 06/05/2016: Stage IIIA (cT1b(2), cN2, cM0) - Signed by Ellouise Newer, PA-C on 08/21/2016    Prior Therapy: 1. Chemoradiation with weekly carboplatin and  paclitaxel, 06/25/16 - 08/20/16. 2. Consolidation durvalumab, 09/26/16 - 09/25/17. 3. SBRT to RUL, LUL nodules 09/10/20 - 09/17/20 4. SBRT to RUL, LUL nodules 06/19/22 - 06/26/22  Current Therapy:  surveillance    HISTORY OF PRESENT ILLNESS:   Oncology History  Non-small cell lung cancer, right (HCC)  04/17/2016 Imaging   CT chest- 1. Examination is positive for bilateral upper lobe pulmonary lesions suspicious for malignancy. Further investigation with PET-CT and tissue sampling is advised. 2. Borderline enlarged paratracheal lymph nodes and right hilar nodes. Cannot rule out metastatic adenopathy. Attention to these areas on PET-CT is advised. 3. Emphysema 4. Aortic atherosclerosis and multi vessel coronary artery calcification.   04/18/2016 Initial Diagnosis   Adenocarcinoma of lung, right (HCC)   04/24/2016 PET scan   1. Adjacent hypermetabolic nodules in the RIGHT upper lobe consistent bronchogenic carcinoma. 2. Suspicion of ipsilateral hilar and paratracheal nodal metastasis. 3. Nodule in the LEFT upper lobe with suspicious morphology has a relatively low metabolic activity for size. Favor synchronous primary bronchogenic carcinoma. 4. No metabolic activity associated with a small LEFT lower lobe pulmonary nodule. Recommend attention on follow-up.   05/06/2016 Imaging   MRI brain- Negative for metastatic disease.   05/28/2016 Procedure   1. Electromagnetic navigational bronchoscopy with brushings, needle     aspirations and biopsies of bilateral upper lobe nodules. 2. Endobronchial ultrasound with mediastinal and hilar lymph node     aspirations. By Dr. Dorris Fetch   06/03/2016 Pathology Results   Diagnosis 1. Lung, biopsy, Right upper lobe - BLOOD AND BENIGN BRONCHIAL AND LUNG TISSUE. 2. Lung, biopsy, Left upper lobe - BLOOD AND BENIGN BRONCHIAL AND LUNG TISSUE.   06/03/2016 Pathology Results   Diagnosis TRANSBRONCHIAL NEEDLE ASPIRATION, NAVIGATION, RUL (SPECIMEN 5 OF 7,  COLLECTED 05/28/16): MALIGNANT CELLS CONSISTENT WITH NON-SMALL CELL CARCINOMA Comment There are limited tumor cells in the cell block (insufficient for molecular testing). TTF-1 is positive in a few of the atypical cells. They appear negative for synaptophysin and cytokeratin 5/6. Overall there is limited tumor, but a lung adenocarcinoma is slightly favored. Dr. Luisa Hart has reviewed the case. The case was called to Dr. Dorris Fetch on 06/03/2016.   06/13/2016 Procedure   Port placed by Dr. Lovell Sheehan   06/25/2016 - 08/15/2016 Chemotherapy   The patient had palonosetron (ALOXI) injection 0.25 mg, 0.25 mg, Intravenous,  Once, 6 of 6 cycles  CARBOplatin (PARAPLATIN) 220 mg in sodium chloride 0.9 % 250 mL chemo infusion, 220 mg (100 % of original dose 215.8 mg), Intravenous,  Once, 6 of 6 cycles Dose modification:   (original dose 215.8 mg, Cycle 1),   (original dose 215.8 mg, Cycle 5),   (original dose 215.8 mg, Cycle 6),   (original dose 215.8 mg, Cycle 2),   (original dose 215.8 mg, Cycle 3),   (original dose 215.8 mg, Cycle 4)  PACLitaxel (TAXOL) 90 mg in dextrose 5 % 250 mL chemo infusion ( for chemotherapy treatment.     06/25/2016 - 08/20/2016 Radiation Therapy   Radiation in [place], [place] by Dr. Louis Matte.  R lung to 6600 cGy   07/09/2016 - 07/10/2016 Hospital Admission   Admit date: 07/09/2016  Admission diagnosis: Fever and chills Additional comments: Negative work-up, discharged with course of Levaquin   07/09/2016 Treatment Plan Change  Treatment deferred x 1 week due to hospitalization    08/05/2016 - 08/06/2016 Hospital Admission   Admit date: 08/05/2016 Admission diagnosis: Joint pain Additional comments: Treated with steroids with symptomatic improvement   08/25/2016 Imaging   MRI brain- Negative for intracranial metastasis on this motion degraded examination.  Stable mild-to-moderate chronic small vessel ischemic disease.   09/18/2016 PET scan   1. Partial metabolic response. Right upper  lobe 2.6 cm hypermetabolic pulmonary nodule, decreased in size and metabolism. Mildly hypermetabolic right hilar nodal metastasis, decreased in metabolism. Right paratracheal lymph node is no longer hypermetabolic. No new or progressive metastatic disease. 2. Apical left upper lobe 0.9 cm solid pulmonary nodule, slightly decreased in size, with no significant metabolism. The change in size could indicate a neoplastic etiology for this nodule. 3. Additional subcentimeter pulmonary nodules in the left lung are stable and below PET resolution . 4. Additional findings include aortic atherosclerosis, coronary atherosclerosis, moderate emphysema, stable small pericardial effusion/thickening and mild sigmoid diverticulosis.   01/13/2017 Imaging   CT chest with contrast IMPRESSION: 1. No change in size of the hypermetabolic nodular thickening in the RIGHT upper lobe. 2. LEFT upper lobe spiculated nodule is stable. 3. Rounded LEFT lower lobe pulmonary nodule is stable. 4. No evidence of lung cancer progression.   05/08/2017 Adverse Reaction   Development of Hypothyroidism- likely secondary to immunotherapy.  Levothyroxine therapy initiated.       INTERVAL HISTORY:   John [name] is a 77 y.o. male presenting to clinic today for follow up of right NSCLC. He was last seen by me on 09/29/22.  Since his last visit, he underwent surveillance chest CT on 12/16/22 that found: multiple areas of presumed radiation induced fibrosis; posterior left upper lobe areas of progressive nodularity which demonstrated hypermetabolism on 05/15/2022 PET, suspicious for local recurrence; other areas of nodularity at sites of radiation fibrosis are relatively similar; new radiation change obscuring the posterior right upper lobe pulmonary nodule; no change in a 6 mm left upper lobe pulmonary nodule; a left apical 7 mm developing nodule is suspicious for primary or metastasis; and new or increased trace right pleural fluid.    Today, he states that he is doing well overall. His appetite level is at ***%. His energy level is at ***%.  PAST MEDICAL HISTORY:   Past 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   Anemia    Anxiety    Arthritis    Asthma    Atrial flutter (HCC)    BPH (benign prostatic hyperplasia)    with urinary retention 02/06/20   CHF (congestive heart failure) (HCC)    COPD (chronic obstructive pulmonary disease) (HCC)    Depression    Diabetes mellitus, type II (HCC)    Dyspnea    History of kidney stones    History of radiation therapy    right lung 09/10/2020-09/17/2020   Dr Roselind Messier   History of radiation therapy    Left and right Lung- 06/19/22-06/26/22   Hyperlipidemia    Hypertension    Hypothyroidism    Macular degeneration    Neuropathy    Non-small cell lung cancer, right (HCC) 04/18/2016   Peripheral vascular disease (HCC)    Prostatitis    Pulmonary nodule, left 07/16/2016   Sleep apnea    cpap    Surgical History: Past Surgical History:  Procedure Laterality Date   A-FLUTTER ABLATION N/A 01/13/2022   Procedure: A-FLUTTER ABLATION;  Surgeon: Steffanie Dunn  T, MD;  Location: [facility] INVASIVE CV LAB;  Service: Cardiovascular;  Laterality: N/A;   ABDOMINAL AORTOGRAM W/LOWER EXTREMITY Left 02/06/2020   Procedure: ABDOMINAL AORTOGRAM W/LOWER EXTREMITY;  Surgeon: Runell Gess, MD;  Location: [facility] INVASIVE CV LAB;  Service: Cardiovascular;  Laterality: Left;   ABDOMINAL AORTOGRAM W/LOWER EXTREMITY N/A 05/30/2021   Procedure: ABDOMINAL AORTOGRAM W/LOWER EXTREMITY;  Surgeon: Cephus Shelling, MD;  Location: [facility] INV[facility] CV LAB;  Service: Cardiovascular;  Laterality: N/A;   BIOPSY  09/16/2021   Procedure: BIOPSY;  Surgeon: Iva Boop, MD;  Location: [facility] ENDOSCOPY;  Service: Gastroenterology;;   CARDIOVERSION N/A 10/05/2020   Procedure: CARDIOVERSION;  Surgeon: Sande Rives, MD;  Location: [facility] ENDOSCOPY;   Service: Cardiovascular;  Laterality: N/A;   CATARACT EXTRACTION, BILATERAL Bilateral    COLONOSCOPY WITH PROPOFOL N/A 06/20/2021   Procedure: COLONOSCOPY WITH PROPOFOL;  Surgeon: Hilarie Fredrickson, MD;  Location: [facility] ENDOSCOPY;  Service: Endoscopy;  Laterality: N/A;   COLONOSCOPY WITH PROPOFOL N/A 09/16/2021   Procedure: COLONOSCOPY WITH PROPOFOL;  Surgeon: Iva Boop, MD;  Location: [facility] ENDOSCOPY;  Service: Gastroenterology;  Laterality: N/A;   ENDARTERECTOMY FEMORAL Right 08/20/2020   Procedure: ENDARTERECTOMY  RIGHT FEMORAL ARTERY;  Surgeon: Cephus Shelling, MD;  Location: [facility] OR;  Service: Vascular;  Laterality: Right;   ENTEROSCOPY N/A 10/11/2021   Procedure: ENTEROSCOPY;  Surgeon: Dolores Frame, MD;  Location: [facility] ENDO SUITE;  Service: Gastroenterology;  Laterality: N/A;   ESOPHAGOGASTRODUODENOSCOPY N/A 09/16/2021   Procedure: ESOPHAGOGASTRODUODENOSCOPY (EGD);  Surgeon: Iva Boop, MD;  Location: [facility] ENDOSCOPY;  Service: Gastroenterology;  Laterality: N/A;   ESOPHAGOGASTRODUODENOSCOPY (EGD) WITH PROPOFOL N/A 09/06/2020   Procedure: ESOPHAGOGASTRODUODENOSCOPY (EGD) WITH PROPOFOL;  Surgeon: Hilarie Fredrickson, MD;  Location: [facility] ENDOSCOPY;  Service: Endoscopy;  Laterality: N/A;   ESOPHAGOGASTRODUODENOSCOPY (EGD) WITH PROPOFOL N/A 10/11/2021   Procedure: ESOPHAGOGASTRODUODENOSCOPY (EGD) WITH PROPOFOL;  Surgeon: Dolores Frame, MD;  Location: [facility] ENDO SUITE;  Service: Gastroenterology;  Laterality: N/A;   HEMOSTASIS CLIP PLACEMENT  09/16/2021   Procedure: HEMOSTASIS CLIP PLACEMENT;  Surgeon: Iva Boop, MD;  Location: [facility] ENDOSCOPY;  Service: Gastroenterology;;   HOT HEMOSTASIS N/A 09/16/2021   Procedure: HOT HEMOSTASIS (ARGON PLASMA COAGULATION/BICAP);  Surgeon: Iva Boop, MD;  Location: [facility] ENDOSCOPY;  Service: Gastroenterology;  Laterality: N/A;   HOT HEMOSTASIS  10/11/2021   Procedure: HOT HEMOSTASIS (ARGON PLASMA COAGULATION/BICAP);  Surgeon: Marguerita Merles, Reuel Boom,  MD;  Location: [facility] ENDO SUITE;  Service: Gastroenterology;;   INSERTION OF ILIAC STENT Right 08/20/2020   Procedure: RETROGRADE INSERTION OF RIGHT ILIAC STENT;  Surgeon: Cephus Shelling, MD;  Location: [facility] OR;  Service: Vascular;  Laterality: Right;   INTRAOPERATIVE ARTERIOGRAM Right 08/20/2020   Procedure: INTRA OPERATIVE ARTERIOGRAM ILIAC;  Surgeon: Cephus Shelling, MD;  Location: [facility] OR;  Service: Vascular;  Laterality: Right;   PATCH ANGIOPLASTY Right 08/20/2020   Procedure: PATCH ANGIOPLASTY RIGHT FEMORAL ARTERY;  Surgeon: Cephus Shelling, MD;  Location: [facility] OR;  Service: Vascular;  Laterality: Right;   PERIPHERAL VASCULAR BALLOON ANGIOPLASTY Right 05/30/2021   Procedure: PERIPHERAL VASCULAR BALLOON ANGIOPLASTY;  Surgeon: Cephus Shelling, MD;  [facility]: [facility] IN[facility] CV LAB;  Service: Cardiovascular;  Laterality: Right;   PORTACATH PLACEMENT Left 06/13/2016   Procedure: INSERTION PORT-A-CATH;  Surgeon: Franky Macho, MD;  Location: [facility] ORS;  Service: General;  Laterality: Left;   TRANSURETHRAL RESECTION OF PROSTATE N/A 05/31/2020   Procedure: TRANSURETHRAL RESECTION OF THE PROSTATE (TURP);  Surgeon: Malen Gauze, MD;  Location: [facility]  ORS;  Service: Urology;  Laterality: N/A;   VIDEO BRONCHOSCOPY WITH ENDOBRONCHIAL NAVIGATION N/A 05/28/2016   Procedure: VIDEO BRONCHOSCOPY WITH ENDOBRONCHIAL NAVIGATION;  Surgeon: Loreli Slot, MD;  Location: [facility] OR;  Service: Thoracic;  Laterality: N/A;   VIDEO BRONCHOSCOPY WITH ENDOBRONCHIAL ULTRASOUND N/A 05/28/2016   Procedure: VIDEO BRONCHOSCOPY WITH ENDOBRONCHIAL ULTRASOUND;  Surgeon: Loreli Slot, MD;  Location: [facility] OR;  Service: Thoracic;  Laterality: N/A;    Social History: Social History   Socioeconomic History   Marital status: Significant Other    Spouse name: Not on file   Number of children: 5   Years of education: 10   Highest education level: 10th grade  Occupational History   Occupation: Retired  Tobacco Use    Smoking status: Every Day    Current packs/day: 0.50    Average packs/day: 0.5 packs/day for 61.8 years (30.9 ttl pk-yrs)    Types: Cigarettes    Start date: 03/11/1961   Smokeless tobacco: Never   Tobacco comments:    1/2 pack to 1 pack per day 12/14/19  Vaping Use   Vaping status: Never Used  Substance and Sexual Activity   Alcohol use: No   Drug use: No   Sexual activity: Yes    Birth control/protection: None  Other Topics Concern   Not on file  Social History Narrative   [name] is his POA and lives with him. Children out of town - one in [place], others in [place].   Social Determinants of Health   Financial Resource Strain: Low Risk  (11/19/2022)   Overall Financial Resource Strain (CARDIA)    Difficulty of Paying Living Expenses: Not hard at all  Food Insecurity: No Food Insecurity (11/19/2022)   Hunger Vital Sign    Worried About Running Out of Food in the Last Year: Never true    Ran Out of Food in the Last Year: Never true  Transportation Needs: No Transportation Needs (11/19/2022)   PRAPARE - Administrator, Civil Service (Medical): No    Lack of Transportation (Non-Medical): No  Physical Activity: Insufficiently Active (11/19/2022)   Exercise Vital Sign    Days of Exercise per Week: 2 days    Minutes of Exercise per Session: 30 min  Stress: No Stress Concern Present (11/19/2022)   [employer] of Occupational Health - Occupational Stress Questionnaire    Feeling of Stress : Not at all  Social Connections: Socially Isolated (11/19/2022)   Social Connection and Isolation Panel [NHANES]    Frequency of Communication with Friends and Family: More than three times a week    Frequency of Social Gatherings with Friends and Family: Never    Attends Religious Services: Never    Database administrator or Organizations: No    Attends Banker Meetings: Never    Marital Status: Divorced  Catering manager Violence: Not At Risk (11/19/2022)    Humiliation, Afraid, Rape, and Kick questionnaire    Fear of Current or Ex-Partner: No    Emotionally Abused: No    Physically Abused: No    Sexually Abused: No    Family History: Family History  Problem Relation Age of Onset   Hypertension Mother    Diabetes Father    Heart disease Father    Stroke Father    Hypertension Sister     Current Medications:  Current Outpatient Medications:    allopurinol (ZYLOPRIM) 300 MG tablet, Take 1 tablet (300 mg total) by mouth daily., Disp:  90 tablet, Rfl: 1   aspirin EC 81 MG tablet, Take 1 tablet (81 mg total) by mouth daily. Swallow whole., Disp: 90 tablet, Rfl: 3   Blood Glucose Monitoring Suppl (ONETOUCH VERIO REFLECT) w/Device KIT, Use to test blood sugars daily as directed. DX: E11.9, Disp: 1 kit, Rfl: 1   celecoxib (CELEBREX) 200 MG capsule, Take 1 capsule (200 mg total) by mouth daily., Disp: 90 capsule, Rfl: 0   cetirizine (ZYRTEC ALLERGY) 10 MG tablet, Take 1 tablet (10 mg total) by mouth daily., Disp: 90 tablet, Rfl: 1   Continuous Glucose Receiver (FREESTYLE LIBRE 3 READER) DEVI, 1 Device by Does not apply route every 14 (fourteen) days., Disp: 1 each, Rfl: 3   Continuous Glucose Sensor (FREESTYLE LIBRE 3 SENSOR) MISC, 1 Device by Does not apply route continuous., Disp: 1 each, Rfl: 2   dapagliflozin propanediol (FARXIGA) 5 MG TABS tablet, Take 1 tablet (5 mg total) by mouth daily before breakfast., Disp: 90 tablet, Rfl: 1   dutasteride (AVODART) 0.5 MG capsule, Take 1 capsule (0.5 mg total) by mouth daily., Disp: 90 capsule, Rfl: 3   ezetimibe (ZETIA) 10 MG tablet, TAKE 1 TABLET BY MOUTH EVERY DAY, Disp: 90 tablet, Rfl: 3   fluticasone (FLONASE) 50 MCG/ACT nasal spray, PLACE 1 SPRAY INTO BOTH NOSTRILS DAILY AS NEEDED FOR ALLERGIES OR RHINITIS., Disp: 48 mL, Rfl: 1   Fluticasone-Umeclidin-Vilant (TRELEGY ELLIPTA) 100-62.5-25 MCG/ACT AEPB, Inhale 1 puff into the lungs daily., Disp: 1 each, Rfl: 11   gabapentin (NEURONTIN) 300 MG  capsule, TAKE 1 CAPSULE BY MOUTH THREE TIMES A DAY, Disp: 270 capsule, Rfl: 0   insulin degludec (TRESIBA FLEXTOUCH) 200 UNIT/ML FlexTouch Pen, Inject 18 Units into the skin every evening., Disp: 12 mL, Rfl: 1   Insulin Pen Needle (PEN NEEDLES) 32G X 4 MM MISC, 1 Application by Does not apply route 3 (three) times daily., Disp: 200 each, Rfl: 1   KLOR-CON M20 20 MEQ tablet, TAKE 1 TABLET BY MOUTH EVERY DAY, Disp: 90 tablet, Rfl: 0   levothyroxine (SYNTHROID) 50 MCG tablet, TAKE 1 TABLET BY MOUTH EVERY DAY, Disp: 90 tablet, Rfl: 1   LINZESS 72 MCG capsule, TAKE 1 CAPSULE BY MOUTH DAILY BEFORE BREAKFAST., Disp: 90 capsule, Rfl: 1   metoprolol succinate (TOPROL XL) 25 MG 24 hr tablet, Take 0.5 tablets (12.5 mg total) by mouth daily., Disp: 45 tablet, Rfl: 3   NON FORMULARY, CPAP at bedtime, Disp: , Rfl:    ondansetron (ZOFRAN) 4 MG tablet, Take 1 tablet (4 mg total) by mouth every 8 (eight) hours as needed for nausea or vomiting., Disp: 20 tablet, Rfl: 0   OneTouch Delica Lancets 30G MISC, Use to test blood sugars daily as directed. DX: E11.9, Disp: 100 each, Rfl: 1   OXYGEN, Inhale 2 L into the lungs daily as needed (with Cpap at night)., Disp: , Rfl:    pantoprazole (PROTONIX) 20 MG tablet, TAKE 1 TABLET BY MOUTH EVERY DAY, Disp: 90 tablet, Rfl: 0   pravastatin (PRAVACHOL) 40 MG tablet, TAKE 1 TABLET (40 MG TOTAL) BY MOUTH ON MONDAYS , WEDNESDAYS, AND FRIDAYS AS DIRECTED, Disp: 48 tablet, Rfl: 1   spironolactone (ALDACTONE) 25 MG tablet, TAKE 1 TABLET (25 MG TOTAL) BY MOUTH DAILY., Disp: 90 tablet, Rfl: 3   torsemide (DEMADEX) 20 MG tablet, Take 1 tablet (20 mg total) by mouth daily., Disp: 90 tablet, Rfl: 2   zolpidem (AMBIEN) 5 MG tablet, Take 1 tablet (5 mg total) by mouth at bedtime  as needed for sleep., Disp: 30 tablet, Rfl: 2   Allergies: Allergies  Allergen Reactions   Sulfa Antibiotics Swelling    Mouth swelling   Atorvastatin Other (See Comments)    Muscle aches - tolerating  Pravastatin 40 mg MWF   Jardiance [Empagliflozin] Other (See Comments)    FEELS SLUGGISH, TIRED   Lopressor [Metoprolol] Other (See Comments)    Fatigue   Rosuvastatin Other (See Comments)    Muscle aches - tolerating Pravastatin 40 mg MWF   Doxycycline Nausea Only   Tramadol Itching   Temazepam Other (See Comments)    Made insomnia worse     REVIEW OF SYSTEMS:   Review of Systems  Constitutional:  Negative for chills, fatigue and fever.  HENT:   Negative for lump/mass, mouth sores, nosebleeds, sore throat and trouble swallowing.   Eyes:  Negative for eye problems.  Respiratory:  Negative for cough and shortness of breath.   Cardiovascular:  Negative for chest pain, leg swelling and palpitations.  Gastrointestinal:  Negative for abdominal pain, constipation, diarrhea, nausea and vomiting.  Genitourinary:  Negative for bladder incontinence, difficulty urinating, dysuria, frequency, hematuria and nocturia.   Musculoskeletal:  Negative for arthralgias, back pain, flank pain, myalgias and neck pain.  Skin:  Negative for itching and rash.  Neurological:  Negative for dizziness, headaches and numbness.  Hematological:  Does not bruise/bleed easily.  Psychiatric/Behavioral:  Negative for depression, sleep disturbance and suicidal ideas. The patient is not nervous/anxious.   All other systems reviewed and are negative.    VITALS:   There were no vitals taken for this visit.  Wt Readings from Last 3 Encounters:  12/18/22 152 lb (68.9 kg)  12/10/22 152 lb 12.8 oz (69.3 kg)  11/20/22 150 lb 12.8 oz (68.4 kg)    There is no height or weight on file to calculate BMI.  Performance status (ECOG): 1 - Symptomatic but completely ambulatory  PHYSICAL EXAM:   Physical Exam Vitals and nursing note reviewed. Exam conducted with a chaperone present.  Constitutional:      Appearance: Normal appearance.  Cardiovascular:     Rate and Rhythm: Normal rate and regular rhythm.     Pulses:  Normal pulses.     Heart sounds: Normal heart sounds.  Pulmonary:     Effort: Pulmonary effort is normal.     Breath sounds: Normal breath sounds.  Abdominal:     Palpations: Abdomen is soft. There is no hepatomegaly, splenomegaly or mass.     Tenderness: There is no abdominal tenderness.  Musculoskeletal:     Right lower leg: No edema.     Left lower leg: No edema.  Lymphadenopathy:     Cervical: No cervical adenopathy.     Right cervical: No superficial, deep or posterior cervical adenopathy.    Left cervical: No superficial, deep or posterior cervical adenopathy.     Upper Body:     Right upper body: No supraclavicular or axillary adenopathy.     Left upper body: No supraclavicular or axillary adenopathy.  Neurological:     General: No focal deficit present.     Mental Status: He is alert and oriented to person, place, and time.  Psychiatric:        Mood and Affect: Mood normal.        Behavior: Behavior normal.     LABS:      Latest Ref Rng & Units 10/07/2022   11:30 AM 09/19/2022   11:34 AM 09/15/2022   10:16  AM  CBC  WBC 3.4 - 10.8 x10E3/uL 9.1  10.0  14.2   Hemoglobin 13.0 - 17.7 g/dL 74.2  59.5  63.8   Hematocrit 37.5 - 51.0 % 40.9  38.9  41.9   Platelets 150 - 450 x10E3/uL 322  447  450       Latest Ref Rng & Units 12/01/2022    2:15 PM 11/17/2022   11:47 AM 10/07/2022   11:30 AM  CMP  Glucose 70 - 99 mg/dL 756  433  295   BUN 8 - 27 mg/dL 18  20  30    Creatinine 0.76 - 1.27 mg/dL 1.88  4.16  6.06   Sodium 134 - 144 mmol/L 136  136  135   Potassium 3.5 - 5.2 mmol/L 4.4  5.5  4.6   Chloride 96 - 106 mmol/L 96  98  94   CO2 20 - 29 mmol/L 23  25  26    Calcium 8.6 - 10.2 mg/dL 9.3  9.8  9.6   Total Protein 6.0 - 8.5 g/dL   7.2   Total Bilirubin 0.0 - 1.2 mg/dL   0.2   Alkaline Phos 44 - 121 IU/L   154   AST 0 - 40 IU/L   20   ALT 0 - 44 IU/L   20      No results found for: "CEA1", "CEA" / No results found for: "CEA1", "CEA" Lab Results  Component Value  Date   PSA1 <0.1 10/31/2022   No results found for: "TKZ601" No results found for: "CAN125"  Lab Results  Component Value Date   TOTALPROTELP 7.0 04/18/2021   ALBUMINELP 3.7 04/18/2021   A1GS 0.3 04/18/2021   A2GS 0.8 04/18/2021   BETS 1.2 04/18/2021   GAMS 1.0 04/18/2021   MSPIKE Not Observed 04/18/2021   SPEI Comment 04/18/2021   Lab Results  Component Value Date   TIBC 440 04/25/2022   TIBC 404 02/21/2022   TIBC 440 12/24/2021   FERRITIN 40 04/25/2022   FERRITIN 100 02/21/2022   FERRITIN 74 12/24/2021   IRONPCTSAT 11 (L) 04/25/2022   IRONPCTSAT 13 (L) 02/21/2022   IRONPCTSAT 21 12/24/2021   Lab Results  Component Value Date   LDH 133 08/02/2018   LDH 126 03/08/2018   LDH 141 10/28/2017     STUDIES:   CT Chest W Contrast  Result Date: 12/22/2022 CLINICAL DATA:  Follow-up of non-small-cell lung cancer. * Tracking Code: BO * EXAM: CT CHEST WITH CONTRAST TECHNIQUE: Multidetector CT imaging of the chest was performed during intravenous contrast administration. RADIATION DOSE REDUCTION: This exam was performed according to the departmental dose-optimization program which includes automated exposure control, adjustment of the mA and/or kV according to patient size and/or use of iterative reconstruction technique. CONTRAST:  75mL OMNIPAQUE IOHEXOL 300 MG/ML  SOLN COMPARISON:  09/19/2022 FINDINGS: Cardiovascular: Aortic atherosclerosis. Left Port-A-Cath tip low SVC. Normal heart size, without pericardial effusion. Three vessel coronary artery calcification. Aortic valve calcification. No central pulmonary embolism, on this non-dedicated study. Mediastinum/Nodes: No supraclavicular adenopathy. No mediastinal or hilar adenopathy. Lungs/Pleura: Trace right pleural fluid superiorly is new or increased. Advanced centrilobular emphysema. New presumed radiation induced airspace and ground-glass at the site of previously described right upper lobe 7 x 4 mm pulmonary nodule. Example image  51/3. Presumably obscures the previously detailed nodule. The central left upper lobe pulmonary nodule is similar at 6 mm on 63/3. A left lower lobe solid pulmonary nodule measures 7 x 6 mm on 84/3  versus 7 x 5 mm on the prior. Right upper lobe juxta mediastinal presumably radiation induced consolidation is slightly increased anteriorly and similar posteriorly. The posterior component is similarly nodular including on 54/2. Subjacent right perihilar radiation induced fibrosis is relatively similar. This also has mild posterior nodularity which is not significantly changed. Posterior left upper lobe ground-glass and septal thickening are presumably radiation induced and slightly increased. Nodular components of 1.4 and 1.1 cm posteriorly including on 36/3 are increased from maximally 1.0 cm on the prior. There is a left apical solid nodule of 7 mm on 25/3 which is increased from 5 mm on the prior. Upper Abdomen: Moderate caudate lobe enlargement is nonspecific. Normal imaged portions of the spleen, stomach, pancreas, right adrenal gland, kidneys. Mild left adrenal thickening and nodularity. Musculoskeletal: Moderate bilateral gynecomastia. No acute osseous abnormality. Loop recorder. IMPRESSION: 1. 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 Posterior left upper lobe areas of progressive nodularity which demonstrated hypermetabolism on 05/15/2022 PET, suspicious for local recurrence. Repeat PET should be considered. 2. Other areas of nodularity at sites of radiation fibrosis are relatively similar and can be re-evaluated on follow-up. 3. New radiation change obscuring the posterior right upper lobe pulmonary nodule. No change in a 6 mm left upper lobe pulmonary nodule. 4. A left apical 7 mm developing nodule is suspicious for primary or metastasis. Below PET resolution. 5. New or increased trace right pleural fluid. 6. Incidental findings, including: Aortic atherosclerosis (ICD10-I70.0), coronary artery  atherosclerosis and emphysema (ICD10-J43.9). Gynecomastia. Electronically Signed   By: Jeronimo Greaves M.D.   On: 12/22/2022 15:44   MR LUMBAR SPINE WO CONTRAST  Result Date: 12/22/2022 CLINICAL DATA:  Chronic low back pain with bilateral radiculopathy EXAM: MRI LUMBAR SPINE WITHOUT CONTRAST TECHNIQUE: Multiplanar, multisequence MR imaging of the lumbar spine was performed. No intravenous contrast was administered. COMPARISON:  X-ray 12/10/2022 FINDINGS: Segmentation:  Standard. Alignment:  Physiologic. Vertebrae:  No fracture, evidence of discitis, or bone lesion. Conus medullaris and cauda equina: Conus extends to the T12-L1 level. Conus appears normal. Bunching of the cauda equina nerve roots above the L2-3 level of stenosis. Paraspinal and other soft tissues: Negative. Disc levels: T12-L1: Shallow right paracentral disc protrusion. No foraminal or canal stenosis. L1-L2: Mild annular disc bulge. Borderline-mild canal stenosis. No foraminal stenosis. L2-L3: Disc bulge and endplate spurring with mild bilateral facet arthropathy. Small facet synovial cyst along the anteromedial margin of the right L2-L3 facet joint measuring 8 x 3 x 6 mm. Severe canal stenosis with right greater than left subarticular recess stenosis. Mild bilateral foraminal stenosis. L3-L4: Disc bulge and endplate spurring with mild facet hypertrophy and ligamentum flavum buckling. Severe canal stenosis with bilateral subarticular recess stenosis. Mild bilateral foraminal stenosis. L4-L5: Disc bulge and endplate spurring with moderate right and mild left foraminal stenosis. Ligamentum flavum buckling. Severe canal stenosis with bilateral subarticular recess stenosis. Moderate-severe left and mild-moderate right foraminal stenosis. L5-S1: Disc bulge and endplate spurring. Mild bilateral facet arthropathy. No canal stenosis. Moderate-severe right and mild-moderate left foraminal stenosis. IMPRESSION: 1. Multilevel lumbar spondylosis with severe  canal stenosis at L2-3, L3-4, and L4-5. 2. Moderate-severe left foraminal stenosis at L4-5 and moderate-severe right foraminal stenosis at L5-S1. Electronically Signed   By: Duanne Guess D.O.   On: 12/22/2022 15:41   DG Lumbar Spine 2-3 Views  Result Date: 12/15/2022 Standing lumbar spine x-rays were obtained in clinic today.  No acute injuries are noted.  Diffuse degenerative changes.  He has loss of disc height at  multiple levels, with anterior bridging osteophytes.  No evidence of anterolisthesis.  No bony lesions.  Impression: Lumbar spine x-rays with diffuse degenerative changes.    CUP PACEART REMOTE DEVICE CHECK  Result Date: 12/09/2022 ILR summary report received. Battery status OK. Normal device function.  Presenting ST @ 108 bpm. No new symptom, brady, or pause episodes. No new AF episodes. 28 AT detections, EGMs consistent with ST between 115-120 bpm.  Average day and night HR > 100  bpm. Known history, on metoprolol per Epic. Monthly summary reports and ROV/PRN - CS, [facility]

## 2022-12-23 ENCOUNTER — Telehealth: Payer: Self-pay

## 2022-12-23 ENCOUNTER — Inpatient Hospital Stay: Payer: Medicare Other | Attending: Hematology | Admitting: Hematology

## 2022-12-23 VITALS — BP 121/66 | HR 118 | Temp 98.1°F | Resp 20 | Ht 66.0 in | Wt 154.6 lb

## 2022-12-23 DIAGNOSIS — D539 Nutritional anemia, unspecified: Secondary | ICD-10-CM | POA: Diagnosis not present

## 2022-12-23 DIAGNOSIS — Z85118 Personal history of other malignant neoplasm of bronchus and lung: Secondary | ICD-10-CM | POA: Diagnosis not present

## 2022-12-23 DIAGNOSIS — R918 Other nonspecific abnormal finding of lung field: Secondary | ICD-10-CM | POA: Insufficient documentation

## 2022-12-23 DIAGNOSIS — E039 Hypothyroidism, unspecified: Secondary | ICD-10-CM | POA: Insufficient documentation

## 2022-12-23 DIAGNOSIS — Z79890 Hormone replacement therapy (postmenopausal): Secondary | ICD-10-CM | POA: Insufficient documentation

## 2022-12-23 DIAGNOSIS — C3491 Malignant neoplasm of unspecified part of right bronchus or lung: Secondary | ICD-10-CM

## 2022-12-23 NOTE — Telephone Encounter (Signed)
-----   Message from [place], [place] E sent at 12/22/2022  4:13 PM EDT ----- He needs to come in for lumbar MRI review. ----- Message ----- From: Interface, Rad Results In Sent: 12/22/2022   3:43 PM EDT To: Juanda Chance, NP

## 2022-12-23 NOTE — Patient Instructions (Signed)
[facility] at [facility] Discharge Instructions   You were seen and examined today by Dr. Ellin Saba.  He reviewed the results of your lab work which is normal/stable.   He reviewed the results of the CT scan. It is showing a questionable area that we will monitor with another scan in 3 months. Otherwise the scan was stable.   Return as scheduled.    Thank you for choosing [facility] at [facility] to provide your oncology and hematology care.  To afford each patient quality time with our provider, please arrive at least 15 minutes before your scheduled appointment time.   If you have a lab appointment with the Cancer Center please come in thru the Main Entrance and check in at the main information desk.  You need to re-schedule your appointment should you arrive 10 or more minutes late.  We strive to give you quality time with our providers, and arriving late affects you and other patients whose appointments are after yours.  Also, if you no show three or more times for appointments you may be dismissed from the clinic at the providers discretion.     Again, thank you for choosing [facility].  Our hope is that these requests will decrease the amount of time that you wait before being seen by our physicians.       _____________________________________________________________  Should you have questions after your visit to [facility], please contact our office at [phone] and follow the prompts.  Our office hours are 8:00 a.m. and 4:30 p.m. Monday - Friday.  Please note that voicemails left after 4:00 p.m. may not be returned until the following business day.  We are closed weekends and major holidays.  You do have access to a nurse 24-7, just call the main number to the clinic [phone] and do not press any options, hold on the line and a nurse will answer the phone.    For prescription refill requests, have  your pharmacy contact our office and allow 72 hours.    Due to Covid, you will need to wear a mask upon entering the hospital. If you do not have a mask, a mask will be given to you at the Main Entrance upon arrival. For doctor visits, patients may have 1 support person age 44 or older with them. For treatment visits, patients can not have anyone with them due to social distancing guidelines and our immunocompromised population.

## 2022-12-23 NOTE — Telephone Encounter (Signed)
Spoke with patient and scheduled OV for 12/25/22.

## 2022-12-25 ENCOUNTER — Encounter: Payer: Self-pay | Admitting: Physical Medicine and Rehabilitation

## 2022-12-25 ENCOUNTER — Ambulatory Visit (INDEPENDENT_AMBULATORY_CARE_PROVIDER_SITE_OTHER): Payer: Medicare Other | Admitting: Physical Medicine and Rehabilitation

## 2022-12-25 DIAGNOSIS — M5441 Lumbago with sciatica, right side: Secondary | ICD-10-CM | POA: Diagnosis not present

## 2022-12-25 DIAGNOSIS — M48062 Spinal stenosis, lumbar region with neurogenic claudication: Secondary | ICD-10-CM

## 2022-12-25 DIAGNOSIS — M5416 Radiculopathy, lumbar region: Secondary | ICD-10-CM

## 2022-12-25 DIAGNOSIS — G8929 Other chronic pain: Secondary | ICD-10-CM | POA: Diagnosis not present

## 2022-12-25 DIAGNOSIS — R269 Unspecified abnormalities of gait and mobility: Secondary | ICD-10-CM | POA: Diagnosis not present

## 2022-12-25 DIAGNOSIS — M5442 Lumbago with sciatica, left side: Secondary | ICD-10-CM | POA: Diagnosis not present

## 2022-12-25 NOTE — Progress Notes (Signed)
John [name] - 77 y.o. male MRN [id number]  Date of birth: [birth year]  Office Visit Note: Visit Date: 12/25/2022 PCP: Junie Spencer, FNP Referred by: Junie Spencer, FNP  Subjective: Chief Complaint  Patient presents with   Lower Back - Pain   HPI: John [name] is a 77 y.o. male who comes in today per evaluation of chronic, worsening and severe bilateral lower back pain radiating to buttocks/hips and down legs. Numbness/tingling to bilateral legs down to feet. Patient is somewhat of poor historian, wife accompanying him during our visit today. Pain ongoing for several months, worsens with standing and walking, he describes pain as sore and tingling sensation, currently denies pain at this time. Some relief of pain with home exercise regimen, rest and use of medications. Some relief of pain with Celebrex. He continues with in home physical therapy. Recent lumbar MRI imaging exhibits severe spinal canal stenosis at L2-L3, L3-L4 and L4-L5. History of balance issues/frequent falls. No recent fall. Patient currently using rolling walker to assist with ambulation.  He has significant medical history including CHF, PAD, atrial flutter, COPD, diabetes mellitus, neuropathy, lung cancer and tobacco abuse. He appears very frail today. He does have multiple allergies/intolerances to medications including Tramadol.    Review of Systems  Musculoskeletal:  Positive for back pain.  Neurological:  Positive for tingling. Negative for focal weakness and weakness.  All other systems reviewed and are negative.  Otherwise per HPI.  Assessment & Plan: Visit Diagnoses:    ICD-10-CM   1. Chronic bilateral low back pain with bilateral sciatica  M54.42    M54.41    G89.29     2. Lumbar radiculopathy  M54.16     3. Spinal stenosis of lumbar region with neurogenic claudication  M48.062     4. Gait abnormality  R26.9        Plan: Findings:  Chronic, worsening and severe bilateral lower back  pain radiating to buttocks/hips and down legs. Paresthesias to bilateral lower extremities. I discussed recent lumbar MRI today with patient and wife using imaging and spine model. Patient continues with conservative therapies/home based physical therapy. He is pain free at this time. Patients clinical presentation and exam are consistent with neurogenic claudication as a result of spinal canal stenosis. There is severe central stenosis at L2-L3, L3-L4 and L4-L5. If his pain persists we discussed possibility of performing lumbar epidural steroid injection. Would recommend bilateral L3 transforaminal approach. He will let us know if his pain worsens. Unsure if patient would be ideal surgical candidate due to advanced age and complex medical history. Patient has no questions today. No red flag symptoms noted upon exam today.     Meds & Orders: No orders of the defined types were placed in this encounter.  No orders of the defined types were placed in this encounter.   Follow-up: Return if symptoms worsen or fail to improve.   Procedures: No procedures performed      Clinical History: CLINICAL DATA:  Chronic low back pain with bilateral radiculopathy   EXAM: MRI LUMBAR SPINE WITHOUT CONTRAST   TECHNIQUE: Multiplanar, multisequence MR imaging of the lumbar spine was performed. No intravenous contrast was administered.   COMPARISON:  X-ray 12/10/2022   FINDINGS: Segmentation:  Standard.   Alignment:  Physiologic.   Vertebrae:  No fracture, evidence of discitis, or bone lesion.   Conus medullaris and cauda equina: Conus extends to the T12-L1 level. Conus appears normal. Bunching of the cauda equina nerve  roots above the L2-3 level of stenosis.   Paraspinal and other soft tissues: Negative.   Disc levels:   T12-L1: Shallow right paracentral disc protrusion. No foraminal or canal stenosis.   L1-L2: Mild annular disc bulge. Borderline-mild canal stenosis. No foraminal stenosis.    L2-L3: Disc bulge and endplate spurring with mild bilateral facet arthropathy. Small facet synovial cyst along the anteromedial margin of the right L2-L3 facet joint measuring 8 x 3 x 6 mm. Severe canal stenosis with right greater than left subarticular recess stenosis. Mild bilateral foraminal stenosis.   L3-L4: Disc bulge and endplate spurring with mild facet hypertrophy and ligamentum flavum buckling. Severe canal stenosis with bilateral subarticular recess stenosis. Mild bilateral foraminal stenosis.   L4-L5: Disc bulge and endplate spurring with moderate right and mild left foraminal stenosis. Ligamentum flavum buckling. Severe canal stenosis with bilateral subarticular recess stenosis. Moderate-severe left and mild-moderate right foraminal stenosis.   L5-S1: Disc bulge and endplate spurring. Mild bilateral facet arthropathy. No canal stenosis. Moderate-severe right and mild-moderate left foraminal stenosis.   IMPRESSION: 1. Multilevel lumbar spondylosis with severe canal stenosis at L2-3, L3-4, and L4-5. 2. Moderate-severe left foraminal stenosis at L4-5 and moderate-severe right foraminal stenosis at L5-S1.     Electronically Signed   By: Duanne Guess D.O.   On: 12/22/2022 15:41   He reports that he has been smoking cigarettes. He started smoking about 61 years ago. He has a 30.9 pack-year smoking history. He has never used smokeless tobacco.  Recent Labs    01/28/22 1242 02/21/22 1614 06/20/22 1153 09/15/22 1013  HGBA1C 7.8*  --  8.5* 8.2*  LABURIC  --  12.6*  --   --     Objective:  VS:  HT:    WT:   BMI:     BP:   HR: bpm  TEMP: ( )  RESP:  Physical Exam Vitals and nursing note reviewed.  HENT:     Head: Normocephalic and atraumatic.     Right Ear: Tympanic membrane normal.     Left Ear: Tympanic membrane normal.     Nose: Nose normal.     Mouth/Throat:     Mouth: Mucous membranes are moist.  Eyes:     Pupils: Pupils are equal, round, and  reactive to light.  Cardiovascular:     Rate and Rhythm: Normal rate.     Pulses: Normal pulses.  Pulmonary:     Effort: Pulmonary effort is normal.  Abdominal:     General: Abdomen is flat. There is no distension.  Musculoskeletal:        General: Tenderness present.     Cervical back: Normal range of motion.     Comments: Patient is slow to rise from seated position to standing. Good lumbar range of motion. No pain noted with facet loading. 5/5 strength noted with bilateral hip flexion, knee flexion/extension, ankle dorsiflexion/plantarflexion and EHL. No clonus noted bilaterally. No pain upon palpation of greater trochanters. No pain with internal/external rotation of bilateral hips. Sensation intact bilaterally. Negative slump test bilaterally. Ambulates with rolling walker, gait slow and unsteady.   Skin:    General: Skin is warm and dry.     Capillary Refill: Capillary refill takes less than 2 seconds.  Neurological:     Mental Status: He is alert and oriented to person, place, and time.     Gait: Gait abnormal.  Psychiatric:        Mood and Affect: Mood normal.  Behavior: Behavior normal.     Ortho Exam  Imaging: No results found.  Past Medical/Family/Surgical/Social History: Medications & Allergies reviewed per EMR, new medications updated. Patient Active Problem List   Diagnosis Date Noted   Lethargy 10/10/2021   Heme positive stool    Rash 10/09/2021   GIB (gastrointestinal bleeding) 09/14/2021   Acute on chronic heart failure with preserved ejection fraction (HFpEF) (HCC) 09/13/2021   Paroxysmal atrial flutter (HCC) 08/25/2021   GERD without esophagitis 08/25/2021   Chronic respiratory failure with hypoxia (HCC) 08/25/2021   Carotid stenosis, right 08/25/2021   Iron deficiency anemia    Positive colorectal cancer screening using Cologuard test    Angiodysplasia of cecum    Elevated troponin level not due myocardial infarction    History of atrial  fibrillation 10/09/2020   Constipation 09/07/2020   Acute gastric ulcer without hemorrhage or perforation    GI bleed 09/05/2020   Atrial flutter (HCC) 09/05/2020   Acute on chronic anemia 09/05/2020   Hypokalemia 05/06/2020   Hypoalbuminemia 05/06/2020   Lactic acidosis 05/06/2020   Tobacco abuse 05/06/2020   Urinary retention 03/09/2020   Hypoxia    COPD GOLD III/ group D still smoking  10/24/2019   PAD (peripheral artery disease) (HCC) 09/13/2019   Obstructive sleep apnea treated with continuous positive airway pressure (CPAP) 05/31/2018   Diabetic neuropathy (HCC) 12/29/2017   Nicotine dependence, cigarettes, uncomplicated 12/29/2017   Mixed diabetic hyperlipidemia associated with type 2 diabetes mellitus (HCC) 12/29/2017   Primary insomnia 12/29/2017   Primary osteoarthritis involving multiple joints 12/29/2017   Hypothyroidism 06/09/2017   Joint pain 08/05/2016   Hyponatremia 08/05/2016   Pulmonary nodule, left 07/16/2016   Acute on chronic diastolic CHF (congestive heart failure) (HCC) 07/09/2016   Diabetes mellitus (HCC) 07/09/2016   Non-small cell lung cancer, right (HCC) 04/18/2016   Hypertension associated with diabetes (HCC) 02/01/2016   COPD (chronic obstructive pulmonary disease) (HCC) 02/01/2016   BPH with urinary obstruction 02/01/2016   Past Medical History:  Diagnosis Date   Adenocarcinoma of lung, right (HCC) 04/18/2016   Anemia    Anxiety    Arthritis    Asthma    Atrial flutter (HCC)    BPH (benign prostatic hyperplasia)    with urinary retention 02/06/20   CHF (congestive heart failure) (HCC)    COPD (chronic obstructive pulmonary disease) (HCC)    Depression    Diabetes mellitus, type II (HCC)    Dyspnea    History of kidney stones    History of radiation therapy    right lung 09/10/2020-09/17/2020   Dr Roselind Messier   History of radiation therapy    Left and right Lung- 06/19/22-06/26/22   Hyperlipidemia    Hypertension    Hypothyroidism    Macular  degeneration    Neuropathy    Non-small cell lung cancer, right (HCC) 04/18/2016   Peripheral vascular disease (HCC)    Prostatitis    Pulmonary nodule, left 07/16/2016   Sleep apnea    cpap   Family History  Problem Relation Age of Onset   Hypertension Mother    Diabetes Father    Heart disease Father    Stroke Father    Hypertension Sister    Past Surgical History:  Procedure Laterality Date   A-FLUTTER ABLATION N/A 01/13/2022   Procedure: A-FLUTTER ABLATION;  Surgeon: Lanier Prude, MD;  Location: [facility] INVASIVE CV LAB;  Service: Cardiovascular;  Laterality: N/A;   ABDOMINAL AORTOGRAM W/LOWER EXTREMITY Left 02/06/2020   Procedure:  ABDOMINAL AORTOGRAM W/LOWER EXTREMITY;  Surgeon: Runell Gess, MD;  [facility] INVASIVE CV LAB;  Service: Cardiovascular;  Laterality: Left;   ABDOMINAL AORTOGRAM W/LOWER EXTREMITY N/A 05/30/2021   Procedure: ABDOMINAL AORTOGRAM W/LOWER EXTREMITY;  Surgeon: Cephus Shelling, MD;  Location: [facility] INVASIVE CV LAB;  Service: Cardiovascular;  Laterality: N/A;   BIOPSY  09/16/2021   Procedure: BIOPSY;  Surgeon: Iva Boop, MD;  Location: [facility] ENDOSCOPY;  Service: Gastroenterology;;   CARDIOVERSION N/A 10/05/2020   Procedure: CARDIOVERSION;  Surgeon: Sande Rives, MD;  Location[facility] ENDOSCOPY;  Service: Cardiovascular;  Laterality: N/A;   CATARACT EXTRACTION, BILATERAL Bilateral    COLONOSCOPY WITH PROPOFOL N/A 06/20/2021   Procedure: COLONOSCOPY WITH PROPOFOL;  Surgeon: Hilarie Fredrickson, MD;  Location: [facility] ENDOSCOPY;  Service: Endoscopy;  Laterality: N/A;   COLONOSCOPY WITH PROPOFOL N/A 09/16/2021   Procedure: COLONOSCOPY WITH PROPOFOL;  Surgeon: Iva Boop, MD;  Location: [facility] ENDOSCOPY;  Service: Gastroenterology;  Laterality: N/A;   ENDARTERECTOMY FEMORAL Right 08/20/2020   Procedure: ENDARTERECTOMY  RIGHT FEMORAL ARTERY;  Surgeon: Cephus Shelling, MD;  Location: [facility] OR;  Service: Vascular;  Laterality: Right;   ENTEROSCOPY N/A 10/11/2021    Procedure: ENTEROSCOPY;  Surgeon: Dolores Frame, MD;  Location: [facility] ENDO SUITE;  Service: Gastroenterology;  Laterality: N/A;   ESOPHAGOGASTRODUODENOSCOPY N/A 09/16/2021   Procedure: ESOPHAGOGASTRODUODENOSCOPY (EGD);  Surgeon: Iva Boop, MD;  Location: [facility] ENDOSCOPY;  Service: Gastroenterology;  Laterality: N/A;   ESOPHAGOGASTRODUODENOSCOPY (EGD) WITH PROPOFOL N/A 09/06/2020   Procedure: ESOPHAGOGASTRODUODENOSCOPY (EGD) WITH PROPOFOL;  Surgeon: Hilarie Fredrickson, MD;  Location: [facility] ENDOSCOPY;  Service: Endoscopy;  Laterality: N/A;   ESOPHAGOGASTRODUODENOSCOPY (EGD) WITH PROPOFOL N/A 10/11/2021   Procedure: ESOPHAGOGASTRODUODENOSCOPY (EGD) WITH PROPOFOL;  Surgeon: Dolores Frame, MD;  Location: [facility] ENDO SUITE;  Service: Gastroenterology;  Laterality: N/A;   HEMOSTASIS CLIP PLACEMENT  09/16/2021   Procedure: HEMOSTASIS CLIP PLACEMENT;  Surgeon: Iva Boop, MD;  Location: [facility] ENDOSCOPY;  Service: Gastroenterology;;   HOT HEMOSTASIS N/A 09/16/2021   Procedure: HOT HEMOSTASIS (ARGON PLASMA COAGULATION/BICAP);  Surgeon: Iva Boop, MD;  Location: [facility] ENDOSCOPY;  Service: Gastroenterology;  Laterality: N/A;   HOT HEMOSTASIS  10/11/2021   Procedure: HOT HEMOSTASIS (ARGON PLASMA COAGULATION/BICAP);  Surgeon: Marguerita Merles, Reuel Boom, MD;  Location: [facility] ENDO SUITE;  Service: Gastroenterology;;   INSERTION OF ILIAC STENT Right 08/20/2020   Procedure: RETROGRADE INSERTION OF RIGHT ILIAC STENT;  Surgeon: Cephus Shelling, MD;  Location: [facility] OR;  Service: Vascular;  Laterality: Right;   INTRAOPERATIVE ARTERIOGRAM Right 08/20/2020   Procedure: INTRA OPERATIVE ARTERIOGRAM ILIAC;  Surgeon: Cephus Shelling, MD;  Location: [facility] OR;  Service: Vascular;  Laterality: Right;   PATCH ANGIOPLASTY Right 08/20/2020   Procedure: PATCH ANGIOPLASTY RIGHT FEMORAL ARTERY;  Surgeon: Cephus Shelling, MD;  Location: [facility] OR;  Service: Vascular;  Laterality: Right;   PERIPHERAL VASCULAR BALLOON  ANGIOPLASTY Right 05/30/2021   Procedure: PERIPHERAL VASCULAR BALLOON ANGIOPLASTY;  Surgeon: Cephus Shelling, MD;  Location: [facility] INVASIVE CV LAB;  Service: Cardiovascular;  Laterality: Right;   PORTACATH PLACEMENT Left 06/13/2016   Procedure: INSERTION PORT-A-CATH;  Surgeon: Franky Macho, MD;  Location: [facility] ORS;  Service: General;  Laterality: Left;   TRANSURETHRAL RESECTION OF PROSTATE N/A 05/31/2020   Procedure: TRANSURETHRAL RESECTION OF THE PROSTATE (TURP);  Surgeon: Malen Gauze, MD;  Location: [facility] ORS;  Service: Urology;  Laterality: N/A;   VIDEO BRONCHOSCOPY WITH ENDOBRONCHIAL NAVIGATION N/A 05/28/2016   Procedure: VIDEO BRONCHOSCOPY WITH ENDOBRONCHIAL NAVIGATION;  Surgeon: Loreli Slot, MD;  Location: [facility] OR;  Service: Thoracic;  Laterality: N/A;   VIDEO BRONCHOSCOPY WITH ENDOBRONCHIAL ULTRASOUND N/A 05/28/2016   Procedure: VIDEO BRONCHOSCOPY WITH ENDOBRONCHIAL ULTRASOUND;  Surgeon: Loreli Slot, MD;  Location: [facility] OR;  Service: Thoracic;  Laterality: N/A;   Social History   Occupational History   Occupation: Retired  Tobacco Use   Smoking status: Every Day    Current packs/day: 0.50    Average packs/day: 0.5 packs/day for 61.8 years (30.9 ttl pk-yrs)    Types: Cigarettes    Start date: 03/11/1961   Smokeless tobacco: Never   Tobacco comments:    1/2 pack to 1 pack per day 12/14/19  Vaping Use   Vaping status: Never Used  Substance and Sexual Activity   Alcohol use: No   Drug use: No   Sexual activity: Yes    Birth control/protection: None

## 2022-12-26 ENCOUNTER — Encounter: Payer: Self-pay | Admitting: Family

## 2022-12-26 ENCOUNTER — Other Ambulatory Visit: Payer: Self-pay | Admitting: Family

## 2022-12-26 ENCOUNTER — Ambulatory Visit: Payer: Medicare Other | Admitting: Family

## 2022-12-26 VITALS — BP 118/57 | HR 117 | Temp 98.1°F | Ht 66.0 in | Wt 155.0 lb

## 2022-12-26 DIAGNOSIS — E1151 Type 2 diabetes mellitus with diabetic peripheral angiopathy without gangrene: Secondary | ICD-10-CM | POA: Diagnosis not present

## 2022-12-26 DIAGNOSIS — R110 Nausea: Secondary | ICD-10-CM | POA: Diagnosis not present

## 2022-12-26 DIAGNOSIS — K5900 Constipation, unspecified: Secondary | ICD-10-CM | POA: Diagnosis not present

## 2022-12-26 DIAGNOSIS — J449 Chronic obstructive pulmonary disease, unspecified: Secondary | ICD-10-CM | POA: Diagnosis not present

## 2022-12-26 DIAGNOSIS — Z23 Encounter for immunization: Secondary | ICD-10-CM | POA: Diagnosis not present

## 2022-12-26 DIAGNOSIS — I152 Hypertension secondary to endocrine disorders: Secondary | ICD-10-CM | POA: Diagnosis not present

## 2022-12-26 DIAGNOSIS — E1169 Type 2 diabetes mellitus with other specified complication: Secondary | ICD-10-CM | POA: Diagnosis not present

## 2022-12-26 DIAGNOSIS — E1159 Type 2 diabetes mellitus with other circulatory complications: Secondary | ICD-10-CM | POA: Diagnosis not present

## 2022-12-26 DIAGNOSIS — E1142 Type 2 diabetes mellitus with diabetic polyneuropathy: Secondary | ICD-10-CM | POA: Diagnosis not present

## 2022-12-26 DIAGNOSIS — I5033 Acute on chronic diastolic (congestive) heart failure: Secondary | ICD-10-CM | POA: Diagnosis not present

## 2022-12-26 MED ORDER — ONDANSETRON HCL 4 MG PO TABS: 4.0000 mg | ORAL_TABLET | Freq: Three times a day (TID) | ORAL | 1 refills | Status: DC | PRN

## 2022-12-26 MED ORDER — TRELEGY ELLIPTA 100-62.5-25 MCG/ACT IN AEPB
1.0000 | INHALATION_SPRAY | Freq: Every day | RESPIRATORY_TRACT | 11 refills | Status: DC
Start: 2022-12-26 — End: 2023-09-09

## 2022-12-26 MED ORDER — ALBUTEROL SULFATE HFA 108 (90 BASE) MCG/ACT IN AERS
2.0000 | INHALATION_SPRAY | Freq: Four times a day (QID) | RESPIRATORY_TRACT | 0 refills | Status: DC | PRN
Start: 2022-12-26 — End: 2023-01-19

## 2022-12-26 MED ORDER — LUBIPROSTONE 8 MCG PO CAPS
8.0000 ug | ORAL_CAPSULE | Freq: Two times a day (BID) | ORAL | 1 refills | Status: AC
Start: 2022-12-26 — End: ?

## 2022-12-26 NOTE — Progress Notes (Signed)
Subjective:    Patient ID: John [name], male    DOB: 11/30/1945, 77 y.o.   MRN: [id number]  Chief Complaint  Patient presents with   Nausea   Constipation    Takes linzess and then he has diarrhea    PT presents to the office today to discuss constipation. He is taking Linzess 72 mcg but that causes watery diarrhea. He reports intermittent nausea.   He has COPD and continues smoking 1/2 pack a day. Using Trelegy daily. Requesting refill today.  Constipation This is a chronic problem. The current episode started more than 1 year ago. The problem has been waxing and waning since onset. His stool frequency is 2 to 3 times per week. Associated symptoms include nausea. Risk factors include obesity. He has tried laxatives (linzess) for the symptoms. The treatment provided moderate relief.      Review of Systems  Gastrointestinal:  Positive for constipation and nausea.  All other systems reviewed and are negative.      Objective:   Physical Exam Vitals reviewed.  Constitutional:      General: He is not in acute distress.    Appearance: He is well-developed.  HENT:     Head: Normocephalic.  Eyes:     General:        Right eye: No discharge.        Left eye: No discharge.     Pupils: Pupils are equal, round, and reactive to light.  Neck:     Thyroid: No thyromegaly.  Cardiovascular:     Rate and Rhythm: Normal rate and regular rhythm.     Heart sounds: Murmur heard.  Pulmonary:     Effort: Pulmonary effort is normal. No respiratory distress.     Breath sounds: Rhonchi present. No wheezing.  Abdominal:     General: Bowel sounds are normal. There is no distension.     Palpations: Abdomen is soft.     Tenderness: There is no abdominal tenderness (bloating).  Musculoskeletal:        General: No tenderness.     Cervical back: Normal range of motion and neck supple.     Comments: Pain in lumbar with flexion and extension  Skin:    General: Skin is warm and dry.      Findings: No erythema or rash.  Neurological:     Mental Status: He is alert and oriented to person, place, and time.     Cranial Nerves: No cranial nerve deficit.     Motor: Weakness present.     Gait: Gait abnormal (using rolling walker).     Deep Tendon Reflexes: Reflexes are normal and symmetric.  Psychiatric:        Behavior: Behavior normal.        Thought Content: Thought content normal.        Judgment: Judgment normal.     BP (!) 118/57   Pulse (!) 117   Temp 98.1 F (36.7 C) (Temporal)   Ht 5\' 6"  (1.676 m)   Wt 155 lb (70.3 kg)   SpO2 93%   BMI 25.02 kg/m      Assessment & Plan:  John [name] comes in today with chief complaint of Nausea and Constipation (Takes linzess and then he has diarrhea )   Diagnosis and orders addressed:  1. Chronic obstructive pulmonary disease, unspecified COPD type (HCC) Smoking cessation discussed- not interested at this time - Fluticasone-Umeclidin-Vilant (TRELEGY ELLIPTA) 100-62.5-25 MCG/ACT AEPB; Inhale 1 puff into the  lungs daily.  Dispense: 1 each; Refill: 11 - albuterol (VENTOLIN HFA) 108 (90 Base) MCG/ACT inhaler; Inhale 2 puffs into the lungs every 6 (six) hours as needed for wheezing or shortness of breath.  Dispense: 8 g; Refill: 0  2. Constipation, unspecified constipation type Start Amitiza and stop Linzess  Force fluids Healthy diet - lubiprostone (AMITIZA) 8 MCG capsule; Take 1 capsule (8 mcg total) by mouth 2 (two) times daily with a meal.  Dispense: 90 capsule; Refill: 1  3. Nausea Zofran as needed  - ondansetron (ZOFRAN) 4 MG tablet; Take 1 tablet (4 mg total) by mouth every 8 (eight) hours as needed for nausea or vomiting.  Dispense: 90 tablet; Refill: 1    Health Maintenance reviewed Diet and exercise encouraged  Follow up plan: Keep chronic follow up   Jannifer Rodney, FNP

## 2022-12-26 NOTE — Telephone Encounter (Signed)
  LINZESS 145 MCG CAPS capsule        Changed from: lubiprostone (AMITIZA) 8 MCG capsule    Pharmacy comment: Alternative Requested:THE PRESCRIBED MEDICATION IS NOT COVERED BY INSURANCE. PLEASE CONSIDER CHANGING TO ONE OF THE SUGGESTED COVERED ALTERNATIVES.

## 2022-12-26 NOTE — Patient Instructions (Signed)

## 2022-12-29 ENCOUNTER — Encounter: Payer: Self-pay | Admitting: Nurse Practitioner

## 2022-12-29 ENCOUNTER — Ambulatory Visit (INDEPENDENT_AMBULATORY_CARE_PROVIDER_SITE_OTHER): Payer: Medicare Other | Admitting: Nurse Practitioner

## 2022-12-29 VITALS — BP 121/66 | HR 103 | Ht 66.0 in | Wt 155.6 lb

## 2022-12-29 DIAGNOSIS — E039 Hypothyroidism, unspecified: Secondary | ICD-10-CM

## 2022-12-29 DIAGNOSIS — E1142 Type 2 diabetes mellitus with diabetic polyneuropathy: Secondary | ICD-10-CM | POA: Diagnosis not present

## 2022-12-29 DIAGNOSIS — Z7984 Long term (current) use of oral hypoglycemic drugs: Secondary | ICD-10-CM

## 2022-12-29 DIAGNOSIS — E1165 Type 2 diabetes mellitus with hyperglycemia: Secondary | ICD-10-CM

## 2022-12-29 DIAGNOSIS — Z794 Long term (current) use of insulin: Secondary | ICD-10-CM

## 2022-12-29 DIAGNOSIS — E782 Mixed hyperlipidemia: Secondary | ICD-10-CM

## 2022-12-29 LAB — POCT GLYCOSYLATED HEMOGLOBIN (HGB A1C): Hemoglobin A1C: 7.9 % — AB (ref 4.0–5.6)

## 2022-12-29 MED ORDER — BD PEN NEEDLE NANO U/F 32G X 4 MM MISC
3 refills | Status: DC
Start: 2022-12-29 — End: 2023-08-24

## 2022-12-29 MED ORDER — TRESIBA FLEXTOUCH 200 UNIT/ML ~~LOC~~ SOPN
16.0000 [IU] | PEN_INJECTOR | Freq: Every evening | SUBCUTANEOUS | 3 refills | Status: DC
Start: 2022-12-29 — End: 2023-05-01

## 2022-12-29 NOTE — Progress Notes (Signed)
Endocrinology Follow Up Note       12/29/2022, 11:15 AM   Subjective:    Patient ID: John [name], male    DOB: 1946-01-28.  John [name] is being seen in follow up after being seen in consultation for management of currently uncontrolled symptomatic diabetes requested by  Junie Spencer, FNP.   Past Medical History:  Diagnosis Date   Adenocarcinoma of lung, right (HCC) 04/18/2016   Anemia    Anxiety    Arthritis    Asthma    Atrial flutter (HCC)    BPH (benign prostatic hyperplasia)    with urinary retention 02/06/20   CHF (congestive heart failure) (HCC)    COPD (chronic obstructive pulmonary disease) (HCC)    Depression    Diabetes mellitus, type II (HCC)    Dyspnea    History of kidney stones    History of radiation therapy    right lung 09/10/2020-09/17/2020   Dr Roselind Messier   History of radiation therapy    Left and right Lung- 06/19/22-06/26/22   Hyperlipidemia    Hypertension    Hypothyroidism    Macular degeneration    Neuropathy    Non-small cell lung cancer, right (HCC) 04/18/2016   Peripheral vascular disease (HCC)    Prostatitis    Pulmonary nodule, left 07/16/2016   Sleep apnea    cpap    Past Surgical History:  Procedure Laterality Date   A-FLUTTER ABLATION N/A 01/13/2022   Procedure: A-FLUTTER ABLATION;  Surgeon: Lanier Prude, MD;  [facility]: [facility];  Service: Cardiovascular;  Laterality: N/A;   ABDOMINAL AORTOGRAM W/LOWER EXTREMITY Left 02/06/2020   Procedure: ABDOMINAL AORTOGRAM W/LOWER EXTREMITY;  Surgeon: Runell Gess, MD;  Location: [facility];  Service: Cardiovascular;  Laterality: Left;   ABDOMINAL AORTOGRAM W/LOWER EXTREMITY N/A 05/30/2021   Procedure: ABDOMINAL AORTOGRAM W/LOWER EXTREMITY;  Surgeon: Cephus Shelling, MD;  Location: [facility];  Service: Cardiovascular;  Laterality: N/A;   BIOPSY  09/16/2021   Procedure: BIOPSY;   Surgeon: Iva Boop, MD;  Location: [facility] ENDOSCOPY;  Service: Gastroenterology;;   CARDIOVERSION N/A 10/05/2020   Procedure: CARDIOVERSION;  Surgeon: Sande Rives, MD;  Location: [facility] ENDOSCOPY;  Service: Cardiovascular;  Laterality: N/A;   CATARACT EXTRACTION, BILATERAL Bilateral    COLONOSCOPY WITH PROPOFOL N/A 06/20/2021   Procedure: COLONOSCOPY WITH PROPOFOL;  Surgeon: Hilarie Fredrickson, MD;  Location: [facility] ENDOSCOPY;  Service: Endoscopy;  Laterality: N/A;   COLONOSCOPY WITH PROPOFOL N/A 09/16/2021   Procedure: COLONOSCOPY WITH PROPOFOL;  Surgeon: Iva Boop, MD;  Location: [facility] ENDOSCOPY;  Service: Gastroenterology;  Laterality: N/A;   ENDARTERECTOMY FEMORAL Right 08/20/2020   Procedure: ENDARTERECTOMY  RIGHT FEMORAL ARTERY;  Surgeon: Cephus Shelling, MD;  Location: [facility] OR;  Service: Vascular;  Laterality: Right;   ENTEROSCOPY N/A 10/11/2021   Procedure: ENTEROSCOPY;  Surgeon: Dolores Frame, MD;  Location: [facility] SUITE;  Service: Gastroenterology;  Laterality: N/A;   ESOPHAGOGASTRODUODENOSCOPY N/A 09/16/2021   Procedure: ESOPHAGOGASTRODUODENOSCOPY (EGD);  Surgeon: Iva Boop, MD;  Location: [facility] ENDOSCOPY;  Service: Gastroenterology;  Laterality: N/A;   ESOPHAGOGASTRODUODENOSCOPY (EGD) WITH PROPOFOL N/A 09/06/2020   Procedure: ESOPHAGOGASTRODUODENOSCOPY (  EGD) WITH PROPOFOL;  Surgeon: Hilarie Fredrickson, MD;  Location: [facility] ENDOSCOPY;  Service: Endoscopy;  Laterality: N/A;   ESOPHAGOGASTRODUODENOSCOPY (EGD) WITH PROPOFOL N/A 10/11/2021   Procedure: ESOPHAGOGASTRODUODENOSCOPY (EGD) WITH PROPOFOL;  Surgeon: Dolores Frame, MD;  [facility]: [facility];  Service: Gastroenterology;  Laterality: N/A;   HEMOSTASIS CLIP PLACEMENT  09/16/2021   Procedure: HEMOSTASIS CLIP PLACEMENT;  Surgeon: Iva Boop, MD;  Location: [facility] ENDOSCOPY;  Service: Gastroenterology;;   HOT HEMOSTASIS N/A 09/16/2021   Procedure: HOT HEMOSTASIS (ARGON PLASMA COAGULATION/BICAP);  Surgeon: Iva Boop, MD;  Location: [facility] ENDOSCOPY;  Service: Gastroenterology;  Laterality: N/A;   HOT HEMOSTASIS  10/11/2021   Procedure: HOT HEMOSTASIS (ARGON PLASMA COAGULATION/BICAP);  Surgeon: Marguerita Merles, Reuel Boom, MD;  Location: [facility];  Service: Gastroenterology;;   INSERTION OF ILIAC STENT Right 08/20/2020   Procedure: RETROGRADE INSERTION OF RIGHT ILIAC STENT;  Surgeon: Cephus Shelling, MD;  Location: [facility] OR;  Service: Vascular;  Laterality: Right;   INTRAOPERATIVE ARTERIOGRAM Right 08/20/2020   Procedure: INTRA OPERATIVE ARTERIOGRAM ILIAC;  Surgeon: Cephus Shelling, MD;  Location: [facility] OR;  Service: Vascular;  Laterality: Right;   PATCH ANGIOPLASTY Right 08/20/2020   Procedure: PATCH ANGIOPLASTY RIGHT FEMORAL ARTERY;  Surgeon: Cephus Shelling, MD;  Location: [facility] OR;  Service: Vascular;  Laterality: Right;   PERIPHERAL VASCULAR BALLOON ANGIOPLASTY Right 05/30/2021   Procedure: PERIPHERAL VASCULAR BALLOON ANGIOPLASTY;  Surgeon: Cephus Shelling, MD;  [facility];  Service: Cardiovascular;  Laterality: Right;   PORTACATH PLACEMENT Left 06/13/2016   Procedure: INSERTION PORT-A-CATH;  Surgeon: Franky Macho, MD;  Location: [facility] ORS;  Service: General;  Laterality: Left;   TRANSURETHRAL RESECTION OF PROSTATE N/A 05/31/2020   Procedure: TRANSURETHRAL RESECTION OF THE PROSTATE (TURP);  Surgeon: Malen Gauze, MD;  Location: [facility] ORS;  Service: Urology;  Laterality: N/A;   VIDEO BRONCHOSCOPY WITH ENDOBRONCHIAL NAVIGATION N/A 05/28/2016   Procedure: VIDEO BRONCHOSCOPY WITH ENDOBRONCHIAL NAVIGATION;  Surgeon: Loreli Slot, MD;  Location: [facility] OR;  Service: Thoracic;  Laterality: N/A;   VIDEO BRONCHOSCOPY WITH ENDOBRONCHIAL ULTRASOUND N/A 05/28/2016   Procedure: VIDEO BRONCHOSCOPY WITH ENDOBRONCHIAL ULTRASOUND;  Surgeon: Loreli Slot, MD;  Location: [facility] OR;  Service: Thoracic;  Laterality: N/A;    Social History   Socioeconomic History   Marital status: Significant  Other    Spouse name: Not on file   Number of children: 5   Years of education: 10   Highest education level: 10th grade  Occupational History   Occupation: Retired  Tobacco Use   Smoking status: Every Day    Current packs/day: 0.50    Average packs/day: 0.5 packs/day for 61.8 years (30.9 ttl pk-yrs)    Types: Cigarettes    Start date: 03/11/1961   Smokeless tobacco: Never   Tobacco comments:    1/2 pack to 1 pack per day 12/14/19  Vaping Use   Vaping status: Never Used  Substance and Sexual Activity   Alcohol use: No   Drug use: No   Sexual activity: Yes    Birth control/protection: None  Other Topics Concern   Not on file  Social History Narrative   [name] is his POA and lives with him. Children out of town - one in [facility], others in [place].   Social Determinants of Health   Financial Resource Strain: Low Risk  (11/19/2022)   Overall Financial Resource Strain (CARDIA)    Difficulty of Paying Living Expenses: Not hard at all  Food Insecurity: No Food Insecurity (11/19/2022)   Hunger Vital Sign    Worried About Running Out of Food in the Last Year: Never true    Ran Out of Food in the Last Year: Never true  Transportation Needs: No Transportation Needs (11/19/2022)   PRAPARE - Administrator, Civil Service (Medical): No    Lack of Transportation (Non-Medical): No  Physical Activity: Insufficiently Active (11/19/2022)   Exercise Vital Sign    Days of Exercise per Week: 2 days    Minutes of Exercise per Session: 30 min  Stress: No Stress Concern Present (11/19/2022)   [employer] of Occupational Health - Occupational Stress Questionnaire    Feeling of Stress : Not at all  Social Connections: Socially Isolated (11/19/2022)   Social Connection and Isolation Panel [NHANES]    Frequency of Communication with Friends and Family: More than three times a week    Frequency of Social Gatherings with Friends and Family: Never    Attends Religious Services: Never     Database administrator or Organizations: No    Attends Engineer, structural: Never    Marital Status: Divorced    Family History  Problem Relation Age of Onset   Hypertension Mother    Diabetes Father    Heart disease Father    Stroke Father    Hypertension Sister     Outpatient Encounter Medications as of 12/29/2022  Medication Sig   albuterol (VENTOLIN HFA) 108 (90 Base) MCG/ACT inhaler Inhale 2 puffs into the lungs every 6 (six) hours as needed for wheezing or shortness of breath.   allopurinol (ZYLOPRIM) 300 MG tablet Take 1 tablet (300 mg total) by mouth daily.   aspirin EC 81 MG tablet Take 1 tablet (81 mg total) by mouth daily. Swallow whole.   Blood Glucose Monitoring Suppl (ONETOUCH VERIO REFLECT) w/Device KIT Use to test blood sugars daily as directed. DX: E11.9   celecoxib (CELEBREX) 200 MG capsule Take 1 capsule (200 mg total) by mouth daily.   cetirizine (ZYRTEC ALLERGY) 10 MG tablet Take 1 tablet (10 mg total) by mouth daily.   Continuous Glucose Receiver (FREESTYLE LIBRE 3 READER) DEVI 1 Device by Does not apply route every 14 (fourteen) days.   Continuous Glucose Sensor (FREESTYLE LIBRE 3 SENSOR) MISC 1 Device by Does not apply route continuous.   dutasteride (AVODART) 0.5 MG capsule Take 1 capsule (0.5 mg total) by mouth daily.   ezetimibe (ZETIA) 10 MG tablet TAKE 1 TABLET BY MOUTH EVERY DAY   fluticasone (FLONASE) 50 MCG/ACT nasal spray PLACE 1 SPRAY INTO BOTH NOSTRILS DAILY AS NEEDED FOR ALLERGIES OR RHINITIS.   Fluticasone-Umeclidin-Vilant (TRELEGY ELLIPTA) 100-62.5-25 MCG/ACT AEPB Inhale 1 puff into the lungs daily.   gabapentin (NEURONTIN) 300 MG capsule TAKE 1 CAPSULE BY MOUTH THREE TIMES A DAY   KLOR-CON M20 20 MEQ tablet TAKE 1 TABLET BY MOUTH EVERY DAY   levothyroxine (SYNTHROID) 50 MCG tablet TAKE 1 TABLET BY MOUTH EVERY DAY   lubiprostone (AMITIZA) 8 MCG capsule Take 1 capsule (8 mcg total) by mouth 2 (two) times daily with a meal.   metoprolol  succinate (TOPROL XL) 25 MG 24 hr tablet Take 0.5 tablets (12.5 mg total) by mouth daily.   NON FORMULARY CPAP at bedtime   ondansetron (ZOFRAN) 4 MG tablet Take 1 tablet (4 mg total) by mouth every 8 (eight) hours as needed for nausea or vomiting.   OneTouch Delica Lancets 30G MISC Use to test  blood sugars daily as directed. DX: E11.9   OXYGEN Inhale 2 L into the lungs daily as needed (with Cpap at night).   pantoprazole (PROTONIX) 20 MG tablet TAKE 1 TABLET BY MOUTH EVERY DAY   pravastatin (PRAVACHOL) 40 MG tablet TAKE 1 TABLET (40 MG TOTAL) BY MOUTH ON MONDAYS , WEDNESDAYS, AND FRIDAYS AS DIRECTED   spironolactone (ALDACTONE) 25 MG tablet TAKE 1 TABLET (25 MG TOTAL) BY MOUTH DAILY.   torsemide (DEMADEX) 20 MG tablet Take 1 tablet (20 mg total) by mouth daily.   zolpidem (AMBIEN) 5 MG tablet Take 1 tablet (5 mg total) by mouth at bedtime as needed for sleep.   [DISCONTINUED] insulin degludec (TRESIBA FLEXTOUCH) 200 UNIT/ML FlexTouch Pen Inject 18 Units into the skin every evening.   [DISCONTINUED] Insulin Pen Needle (PEN NEEDLES) 32G X 4 MM MISC 1 Application by Does not apply route 3 (three) times daily.   insulin degludec (TRESIBA FLEXTOUCH) 200 UNIT/ML FlexTouch Pen Inject 16 Units into the skin every evening.   Insulin Pen Needle (BD PEN NEEDLE NANO U/F) 32G X 4 MM MISC Use to inject insulin once daily   [DISCONTINUED] dapagliflozin propanediol (FARXIGA) 5 MG TABS tablet Take 1 tablet (5 mg total) by mouth daily before breakfast. (Patient not taking: Reported on 12/29/2022)   [DISCONTINUED] Fluticasone-Umeclidin-Vilant (TRELEGY ELLIPTA) 100-62.5-25 MCG/ACT AEPB Inhale 1 puff into the lungs daily.   [DISCONTINUED] LINZESS 72 MCG capsule TAKE 1 CAPSULE BY MOUTH DAILY BEFORE BREAKFAST.   [DISCONTINUED] ondansetron (ZOFRAN) 4 MG tablet Take 1 tablet (4 mg total) by mouth every 8 (eight) hours as needed for nausea or vomiting.   No facility-administered encounter medications on file as of  12/29/2022.    ALLERGIES: Allergies  Allergen Reactions   Sulfa Antibiotics Swelling    Mouth swelling   Atorvastatin Other (See Comments)    Muscle aches - tolerating Pravastatin 40 mg MWF   Jardiance [Empagliflozin] Other (See Comments)    FEELS SLUGGISH, TIRED   Lopressor [Metoprolol] Other (See Comments)    Fatigue   Rosuvastatin Other (See Comments)    Muscle aches - tolerating Pravastatin 40 mg MWF   Doxycycline Nausea Only   Tramadol Itching   Temazepam Other (See Comments)    Made insomnia worse     VACCINATION STATUS: Immunization History  Administered Date(s) Administered   Fluad Quad(high Dose 65+) 01/19/2019, 02/01/2020, 01/23/2021, 01/28/2022   Fluad Trivalent(High Dose 65+) 12/26/2022   Influenza, High Dose Seasonal PF 12/28/2015   Influenza,inj,Quad PF,6+ Mos 01/23/2017   Moderna Covid-19 Fall Seasonal Vaccine 8yrs & older 03/28/2022   Moderna Sars-Covid-2 Vaccination 05/10/2019, 06/07/2019, 01/31/2020   Pneumococcal Conjugate-13 06/17/2017   Pneumococcal Polysaccharide-23 04/25/2019   Respiratory Syncytial Virus Vaccine,Recomb Aduvanted(Arexvy) 03/28/2022   Tdap 05/17/2019   Zoster Recombinant(Shingrix) 11/25/2021, 02/12/2022   Zoster, Live 07/10/2014    Diabetes He presents for his follow-up diabetic visit. He has type 2 diabetes mellitus. Onset time: Was diagnosed at approx age of 20. His disease course has been improving. There are no hypoglycemic associated symptoms. Pertinent negatives for hypoglycemia include no nervousness/anxiousness or tremors. Associated symptoms include blurred vision and foot paresthesias. Pertinent negatives for diabetes include no fatigue, no polydipsia, no polyphagia and no weight loss. There are no hypoglycemic complications. Symptoms are stable. Diabetic complications include heart disease, nephropathy, peripheral neuropathy and PVD. Risk factors for coronary artery disease include diabetes mellitus, dyslipidemia, hypertension,  male sex, sedentary lifestyle and tobacco exposure. Current diabetic treatment includes insulin injections. He is compliant with  treatment most of the time. His weight is fluctuating minimally. He is following a generally healthy diet. When asked about meal planning, he reported none. He has not had a previous visit with a dietitian. He rarely participates in exercise. His home blood glucose trend is decreasing steadily. His breakfast blood glucose range is generally 130-140 mg/dl. His overall blood glucose range is 140-180 mg/dl. (He presents today, accompanied by his wife, with his meter and CGM showing mostly at target glycemic profile overall.  His POCT A1c today is 7.9%, improving from last visit of 8.2%.  He notes he did increase his Tresiba to 16 units as his glucose was trending upwards.  His PCP did prescribe Marcelline Deist for him (likely due to hx of CHF and diabetes) however he has not been taking it (possibly too expensive?). Analysis of his CGM shows TIR 70%, TAR 28%, TBR 2% with a GMI of 7.1%.) An ACE inhibitor/angiotensin II receptor blocker is being taken. He sees a podiatrist.Eye exam is current.  Hyperlipidemia This is a chronic problem. The current episode started more than 1 year ago. The problem is controlled. Recent lipid tests were reviewed and are normal. Exacerbating diseases include chronic renal disease, diabetes and hypothyroidism. Factors aggravating his hyperlipidemia include fatty foods and beta blockers. Current antihyperlipidemic treatment includes statins. The current treatment provides moderate improvement of lipids. There are no compliance problems.  Risk factors for coronary artery disease include diabetes mellitus, dyslipidemia, hypertension, male sex and a sedentary lifestyle.  Hypertension This is a chronic problem. The current episode started more than 1 year ago. The problem has been gradually improving since onset. The problem is controlled. Associated symptoms include  blurred vision and palpitations. Agents associated with hypertension include thyroid hormones. Risk factors for coronary artery disease include diabetes mellitus, dyslipidemia, sedentary lifestyle, smoking/tobacco exposure and male gender. Past treatments include angiotensin blockers, diuretics, beta blockers and alpha 1 blockers. The current treatment provides moderate improvement. There are no compliance problems.  Hypertensive end-organ damage includes kidney disease, CAD/MI and PVD. Identifiable causes of hypertension include chronic renal disease, sleep apnea and a thyroid problem.  Thyroid Problem Presents for follow-up visit. Symptoms include palpitations. Patient reports no anxiety, cold intolerance, constipation, depressed mood, diarrhea, dry skin, fatigue, heat intolerance, tremors or weight loss. (Having cardioversion on 7/1 for Afib) The symptoms have been stable. Past treatments include levothyroxine. The treatment provided moderate relief. His past medical history is significant for diabetes and hyperlipidemia. There are no known risk factors.     Review of systems  Constitutional: + stable body weight,  current Body mass index is 25.11 kg/m. , + fatigue, no subjective hyperthermia, no subjective hypothermia Eyes: no blurry vision, no xerophthalmia ENT: no sore throat, no nodules palpated in throat, no dysphagia/odynophagia, no hoarseness Cardiovascular: no chest pain, no shortness of breath, no palpitations, no leg swelling Respiratory: no cough, no shortness of breath Gastrointestinal: no nausea/vomiting/diarrhea; constipation-stable Musculoskeletal: no muscle/joint aches Skin: no rashes, no hyperemia Neurological: no tremors, + numbness/tingling to BLE, + dizziness with standing- intermittent Psychiatric: no depression, no anxiety  Objective:     BP 121/66 (BP Location: Right Arm, Patient Position: Sitting, Cuff Size: Large)   Pulse (!) 103   Ht 5\' 6"  (1.676 m)   Wt 155 lb  9.6 oz (70.6 kg)   BMI 25.11 kg/m   Wt Readings from Last 3 Encounters:  12/29/22 155 lb 9.6 oz (70.6 kg)  12/26/22 155 lb (70.3 kg)  12/23/22 154 lb 9.6 oz (70.1  kg)     BP Readings from Last 3 Encounters:  12/29/22 121/66  12/26/22 (!) 118/57  12/23/22 121/66      Physical Exam- Limited  Constitutional:  Body mass index is 25.11 kg/m. , not in acute distress, normal state of mind Eyes:  EOMI, no exophthalmos Musculoskeletal: no gross deformities, strength intact in all four extremities, no gross restriction of joint movements Skin:  no rashes, no hyperemia, nicotinic discoloration to finger tips bilaterally Neurological: no tremor with outstretched hands    Diabetic Foot Exam - Simple   No data filed     CMP ( most recent) CMP     Component Value Date/Time   NA 136 12/01/2022 1415   K 4.4 12/01/2022 1415   CL 96 12/01/2022 1415   CO2 23 12/01/2022 1415   GLUCOSE 223 (H) 12/01/2022 1415   GLUCOSE 167 (H) 09/19/2022 1134   BUN 18 12/01/2022 1415   CREATININE 0.97 12/01/2022 1415   CALCIUM 9.3 12/01/2022 1415   PROT 7.2 10/07/2022 1130   ALBUMIN 4.1 10/07/2022 1130   AST 20 10/07/2022 1130   ALT 20 10/07/2022 1130   ALKPHOS 154 (H) 10/07/2022 1130   BILITOT 0.2 10/07/2022 1130   GFRNONAA >60 09/19/2022 1134   GFRAA 102 05/24/2020 1238     Diabetic Labs (most recent): Lab Results  Component Value Date   HGBA1C 7.9 (A) 12/29/2022   HGBA1C 8.2 (H) 09/15/2022   HGBA1C 8.5 (H) 06/20/2022   MICROALBUR 80 mg/L 09/23/2022   MICROALBUR 30 08/14/2021   MICROALBUR 30 09/24/2020     Lipid Panel ( most recent) Lipid Panel     Component Value Date/Time   CHOL 126 08/21/2020 0530   CHOL 136 04/25/2019 1608   TRIG 89 08/21/2020 0530   HDL 28 (L) 08/21/2020 0530   HDL 28 (L) 04/25/2019 1608   CHOLHDL 4.5 08/21/2020 0530   VLDL 18 08/21/2020 0530   LDLCALC 80 08/21/2020 0530   LDLCALC 78 04/25/2019 1608   LABVLDL 30 04/25/2019 1608      Lab Results   Component Value Date   TSH 2.178 09/19/2022   TSH 1.960 11/25/2021   TSH 2.510 08/23/2021   TSH 2.686 04/18/2021   TSH 3.430 01/22/2021   TSH 5.033 (H) 12/17/2020   TSH 3.410 10/26/2020   TSH 0.628 08/21/2020   TSH 1.542 08/13/2020   TSH 2.180 08/03/2020   FREET4 1.14 01/22/2021   FREET4 1.96 (H) 06/03/2020   FREET4 1.03 02/20/2017           Assessment & Plan:   1) Type 2 diabetes mellitus with diabetic polyneuropathy, without long-term current use of insulin (HCC)  - John [name] has currently uncontrolled symptomatic type 2 DM since 77 years of age.  He presents today, accompanied by his wife, with his meter and CGM showing mostly at target glycemic profile overall.  His POCT A1c today is 7.9%, improving from last visit of 8.2%.  He notes he did increase his Tresiba to 16 units as his glucose was trending upwards.  His PCP did prescribe Marcelline Deist for him (likely due to hx of CHF and diabetes) however he has not been taking it (possibly too expensive?). Analysis of his CGM shows TIR 70%, TAR 28%, TBR 2% with a GMI of 7.1%.  -Recent labs reviewed.  His POCT UM shows microalbuminuria (likely why he was on Farxiga to begin with).  His kidney function via blood draw recently was normal.  Will avoid putting him  back on it due to dehydration issues in the past.  - I had a long discussion with him about the progressive nature of diabetes and the pathology behind its complications. -his diabetes is complicated by CKD stage 2, PAD, and neuropathy and he remains at a high risk for more acute and chronic complications which include CAD, CVA, and retinopathy. These are all discussed in detail with him.  - Nutritional counseling repeated at each appointment due to patients tendency to fall back in to old habits.  - The patient admits there is a room for improvement in their diet and drink choices. -  Suggestion is made for the patient to avoid simple carbohydrates from their diet  including Cakes, Sweet Desserts / Pastries, Ice Cream, Soda (diet and regular), Sweet Tea, Candies, Chips, Cookies, Sweet Pastries, Store Bought Juices, Alcohol in Excess of 1-2 drinks a day, Artificial Sweeteners, Coffee Creamer, and "Sugar-free" Products. This will help patient to have stable blood glucose profile and potentially avoid unintended weight gain.   - I encouraged the patient to switch to unprocessed or minimally processed complex starch and increased protein intake (animal or plant source), fruits, and vegetables.   - Patient is advised to stick to a routine mealtimes to eat 3 meals a day and avoid unnecessary snacks (to snack only to correct hypoglycemia).  - he will be scheduled with Norm Salt, RDN, CDE for diabetes education.  - I have approached him with the following individualized plan to manage  his diabetes and patient agrees:   -He is advised to continue his current dose of Tresiba 16 units SQ nightly.  I advised him not to start Marcelline Deist as the risks of this medication may be worse than the benefits.  He has history of multiple UTIs in the past which this may increase his risk for.  It also increases his risk of dehydration as he is already on several diuretics for his CHF.  -he is encouraged to continue monitoring blood glucose twice daily (continue using his CGM), before breakfast and before bed and to call the clinic if he has readings less than 70 or greater than 200 for 3 tests in a row.    - Specific targets for  A1c;  LDL, HDL,  and Triglycerides were discussed with the patient.  2) Blood Pressure /Hypertension:  his blood pressure is controlled to target.  Will defer any changes to cardiology or PCP.  3) Lipids/Hyperlipidemia:    Review of his recent lipid panel from 08/21/20 showed controlled  LDL at 80.  he  is advised to continue Pravachol 40 mg daily at bedtime.  Side effects and precautions discussed with him.  4)  Weight/Diet:  his Body mass index is  25.11 kg/m.  -  he is is not a candidate for major weight loss. I discussed with him the fact that loss of 5 - 10% of his  current body weight will have the most impact on his diabetes management.  Exercise, and detailed carbohydrates information provided  -  detailed on discharge instructions.  5) Hypothyroidism: There are no recent TFTs to review.  He is advised to continue Levothyroxine 50 mcg po daily before breakfast.  Will recheck on subsequent visits.   - The correct intake of thyroid hormone (Levothyroxine, Synthroid), is on empty stomach first thing in the morning, with water, separated by at least 30 minutes from breakfast and other medications,  and separated by more than 4 hours from calcium, iron, multivitamins, acid  reflux medications (PPIs).  - This medication is a life-long medication and will be needed to correct thyroid hormone imbalances for the rest of your life.  The dose may change from time to time, based on thyroid blood work.  - It is extremely important to be consistent taking this medication, near the same time each morning.  -AVOID TAKING PRODUCTS CONTAINING BIOTIN (commonly found in Hair, Skin, Nails vitamins) AS IT INTERFERES WITH THE VALIDITY OF THYROID FUNCTION BLOOD TESTS.  6) Chronic Care/Health Maintenance: -he is on ACEI/ARB and Statin medications and is encouraged to initiate and continue to follow up with Ophthalmology, Dentist, Podiatrist at least yearly or according to recommendations, and advised to QUIT SMOKING. I have recommended yearly flu vaccine and pneumonia vaccine at least every 5 years; moderate intensity exercise for up to 150 minutes weekly; and sleep for at least 7 hours a day.  - he is advised to maintain close follow up with Junie Spencer, FNP for primary care needs, as well as his other providers for optimal and coordinated care.     I spent  44  minutes in the care of the patient today including review of labs from CMP, Lipids, Thyroid  Function, Hematology (current and previous including abstractions from other facilities); face-to-face time discussing  his blood glucose readings/logs, discussing hypoglycemia and hyperglycemia episodes and symptoms, medications doses, his options of short and long term treatment based on the latest standards of care / guidelines;  discussion about incorporating lifestyle medicine;  and documenting the encounter. Risk reduction counseling performed per USPSTF guidelines to reduce obesity and cardiovascular risk factors.     Please refer to Patient Instructions for Blood Glucose Monitoring and Insulin/Medications Dosing Guide"  in media tab for additional information. Please  also refer to " Patient Self Inventory" in the Media  tab for reviewed elements of pertinent patient history.  [name] participated in the discussions, expressed understanding, and voiced agreement with the above plans.  All questions were answered to his satisfaction. he is encouraged to contact clinic should he have any questions or concerns prior to his return visit.   Follow up plan: - Return in about 3 months (around 03/30/2023) for Diabetes F/U with A1c in office, Bring meter and logs, No previsit labs.  Ronny Bacon, Mpi [facility] [address] [place], [place] [address] Phone: [phone] Fax: [phone]  12/29/2022, 11:15 AM

## 2022-12-29 NOTE — Progress Notes (Unsigned)
Cardiology Clinic Note   Patient Name: John [name] Date of Encounter: 12/30/2022  Primary Care Provider:  Junie Spencer, FNP Primary Cardiologist:  Reatha Harps, MD  Patient Profile    77 year old male with history of HFpEF, atrial flutter, status post ablation 01/13/2022, CHA2DS2-VASc score of 5, hyperlipidemia PAD, right femoral endarterectomy 08/20/2020 and right common iliac artery stent 08/10/2020, COPD, non-small cell lung CA status post chemoradiation in 2018, and GI bleed. Was referred to EP to be considered for Watchman device. ILR was placed per Dr. Lalla Brothers. Eliquis was discontinued and he was started on aspirin 81 mg daily.    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   Anemia    Anxiety    Arthritis    Asthma    Atrial flutter (HCC)    BPH (benign prostatic hyperplasia)    with urinary retention 02/06/20   CHF (congestive heart failure) (HCC)    COPD (chronic obstructive pulmonary disease) (HCC)    Depression    Diabetes mellitus, type II (HCC)    Dyspnea    History of kidney stones    History of radiation therapy    right lung 09/10/2020-09/17/2020   Dr Roselind Messier   History of radiation therapy    Left and right Lung- 06/19/22-06/26/22   Hyperlipidemia    Hypertension    Hypothyroidism    Macular degeneration    Neuropathy    Non-small cell lung cancer, right (HCC) 04/18/2016   Peripheral vascular disease (HCC)    Prostatitis    Pulmonary nodule, left 07/16/2016   Sleep apnea    cpap   Past Surgical History:  Procedure Laterality Date   A-FLUTTER ABLATION N/A 01/13/2022   Procedure: A-FLUTTER ABLATION;  Surgeon: Lanier Prude, MD;  [facility]: [facility] LAB;  Service: Cardiovascular;  Laterality: N/A;   ABDOMINAL AORTOGRAM W/LOWER EXTREMITY Left 02/06/2020   Procedure: ABDOMINAL AORTOGRAM W/LOWER EXTREMITY;  Surgeon: Runell Gess, MD;  Location: [facility];  Service:  Cardiovascular;  Laterality: Left;   ABDOMINAL AORTOGRAM W/LOWER EXTREMITY N/A 05/30/2021   Procedure: ABDOMINAL AORTOGRAM W/LOWER EXTREMITY;  Surgeon: Cephus Shelling, MD;  Location: [facility];  Service: Cardiovascular;  Laterality: N/A;   BIOPSY  09/16/2021   Procedure: BIOPSY;  Surgeon: Iva Boop, MD;  Location: [facility] ENDOSCOPY;  Service: Gastroenterology;;   CARDIOVERSION N/A 10/05/2020   Procedure: CARDIOVERSION;  Surgeon: Sande Rives, MD;  Location: [facility] ENDOSCOPY;  Service: Cardiovascular;  Laterality: N/A;   CATARACT EXTRACTION, BILATERAL Bilateral    COLONOSCOPY WITH PROPOFOL N/A 06/20/2021   Procedure: COLONOSCOPY WITH PROPOFOL;  Surgeon: Hilarie Fredrickson, MD;  Location: [facility] ENDOSCOPY;  Service: Endoscopy;  Laterality: N/A;   COLONOSCOPY WITH PROPOFOL N/A 09/16/2021   Procedure: COLONOSCOPY WITH PROPOFOL;  Surgeon: Iva Boop, MD;  Location: [facility] ENDOSCOPY;  Service: Gastroenterology;  Laterality: N/A;   ENDARTERECTOMY FEMORAL Right 08/20/2020   Procedure: ENDARTERECTOMY  RIGHT FEMORAL ARTERY;  Surgeon: Cephus Shelling, MD;  Location: [facility] OR;  Service: Vascular;  Laterality: Right;   ENTEROSCOPY N/A 10/11/2021   Procedure: ENTEROSCOPY;  Surgeon: Dolores Frame, MD;  Location: [facility] ENDO SUITE;  Service: Gastroenterology;  Laterality: N/A;   ESOPHAGOGASTRODUODENOSCOPY N/A 09/16/2021   Procedure: ESOPHAGOGASTRODUODENOSCOPY (EGD);  Surgeon: Iva Boop, MD;  Location: [facility] ENDOSCOPY;  Service: Gastroenterology;  Laterality: N/A;   ESOPHAGOGASTRODUODENOSCOPY (EGD) WITH PROPOFOL N/A 09/06/2020   Procedure: ESOPHAGOGASTRODUODENOSCOPY (  EGD) WITH PROPOFOL;  Surgeon: Hilarie Fredrickson, MD;  Location: [facility] ENDOSCOPY;  Service: Endoscopy;  Laterality: N/A;   ESOPHAGOGASTRODUODENOSCOPY (EGD) WITH PROPOFOL N/A 10/11/2021   Procedure: ESOPHAGOGASTRODUODENOSCOPY (EGD) WITH PROPOFOL;  Surgeon: Dolores Frame, MD;  [facility]: [facility]O [facility]ITE;  Service: Gastroenterology;   Laterality: N/A;   HEMOSTASIS CLIP PLACEMENT  09/16/2021   Procedure: HEMOSTASIS CLIP PLACEMENT;  Surgeon: Iva Boop, MD;  Location: [facility] ENDOSCOPY;  Service: Gastroenterology;;   HOT HEMOSTASIS N/A 09/16/2021   Procedure: HOT HEMOSTASIS (ARGON PLASMA COAGULATION/BICAP);  Surgeon: Iva Boop, MD;  Location: [facility] ENDOSCOPY;  Service: Gastroenterology;  Laterality: N/A;   HOT HEMOSTASIS  10/11/2021   Procedure: HOT HEMOSTASIS (ARGON PLASMA COAGULATION/BICAP);  Surgeon: Marguerita Merles, Reuel Boom, MD;  Location: [facility]O [facility];  Service: Gastroenterology;;   INSERTION OF ILIAC STENT Right 08/20/2020   Procedure: RETROGRADE INSERTION OF RIGHT ILIAC STENT;  Surgeon: Cephus Shelling, MD;  Location: [facility] OR;  Service: Vascular;  Laterality: Right;   INTRAOPERATIVE ARTERIOGRAM Right 08/20/2020   Procedure: INTRA OPERATIVE ARTERIOGRAM ILIAC;  Surgeon: Cephus Shelling, MD;  Location: [facility] OR;  Service: Vascular;  Laterality: Right;   PATCH ANGIOPLASTY Right 08/20/2020   Procedure: PATCH ANGIOPLASTY RIGHT FEMORAL ARTERY;  Surgeon: Cephus Shelling, MD;  Location: [facility] OR;  Service: Vascular;  Laterality: Right;   PERIPHERAL VASCULAR BALLOON ANGIOPLASTY Right 05/30/2021   Procedure: PERIPHERAL VASCULAR BALLOON ANGIOPLASTY;  Surgeon: Cephus Shelling, MD;  [facility];  Service: Cardiovascular;  Laterality: Right;   PORTACATH PLACEMENT Left 06/13/2016   Procedure: INSERTION PORT-A-CATH;  Surgeon: Franky Macho, MD;  Location: [facility] ORS;  Service: General;  Laterality: Left;   TRANSURETHRAL RESECTION OF PROSTATE N/A 05/31/2020   Procedure: TRANSURETHRAL RESECTION OF THE PROSTATE (TURP);  Surgeon: Malen Gauze, MD;  Location: [facility] ORS;  Service: Urology;  Laterality: N/A;   VIDEO BRONCHOSCOPY WITH ENDOBRONCHIAL NAVIGATION N/A 05/28/2016   Procedure: VIDEO BRONCHOSCOPY WITH ENDOBRONCHIAL NAVIGATION;  Surgeon: Loreli Slot, MD;  Location: [facility] OR;  Service: Thoracic;  Laterality: N/A;    VIDEO BRONCHOSCOPY WITH ENDOBRONCHIAL ULTRASOUND N/A 05/28/2016   Procedure: VIDEO BRONCHOSCOPY WITH ENDOBRONCHIAL ULTRASOUND;  Surgeon: Loreli Slot, MD;  Location: [facility] OR;  Service: Thoracic;  Laterality: N/A;    Allergies  Allergies  Allergen Reactions   Sulfa Antibiotics Swelling    Mouth swelling   Atorvastatin Other (See Comments)    Muscle aches - tolerating Pravastatin 40 mg MWF   Jardiance [Empagliflozin] Other (See Comments)    FEELS SLUGGISH, TIRED   Lopressor [Metoprolol] Other (See Comments)    Fatigue   Rosuvastatin Other (See Comments)    Muscle aches - tolerating Pravastatin 40 mg MWF   Doxycycline Nausea Only   Tramadol Itching   Temazepam Other (See Comments)    Made insomnia worse     History of Present Illness    Mr. Finseth comes today for ongoing assessment and management of HFpEF, atrial flutter, status post ablation continues on  aspirin, and stopped Eliquis on last office visit through EP.  Last office visit the patient had complaints of dizziness and fatigue.  He stopped taking metoprolol as he stated that it made him too tired and dizzy.  He was wheelchair dependent.  He had not yet seen electrophysiology.  I stopped metolazone due to his dizziness and fatigue he was continued on torsemide and spironolactone.  He was encouraged to work with physical therapy for strengthening once his blood pressure became normalized.  On last office visit blood pressure was 118/62.  He comes today feeling some better with the  discontinuation of metolazone blood pressure is better, and he is participating in physical therapy and is feeling stronger.  He does have complaints of gynecomastia.  Symptoms.  He has been scheduled for mammogram by his primary care provider.  He continues to have some wheezing due to his COPD with physical therapy but denies any significant shortness of breath.  Home Medications    Current Outpatient Medications  Medication Sig Dispense  Refill   albuterol (VENTOLIN HFA) 108 (90 Base) MCG/ACT inhaler Inhale 2 puffs into the lungs every 6 (six) hours as needed for wheezing or shortness of breath. 8 g 0   allopurinol (ZYLOPRIM) 300 MG tablet Take 1 tablet (300 mg total) by mouth daily. 90 tablet 1   aspirin EC 81 MG tablet Take 1 tablet (81 mg total) by mouth daily. Swallow whole. 90 tablet 3   Blood Glucose Monitoring Suppl (ONETOUCH VERIO REFLECT) w/Device KIT Use to test blood sugars daily as directed. DX: E11.9 1 kit 1   celecoxib (CELEBREX) 200 MG capsule Take 1 capsule (200 mg total) by mouth daily. 90 capsule 0   cetirizine (ZYRTEC ALLERGY) 10 MG tablet Take 1 tablet (10 mg total) by mouth daily. 90 tablet 1   Continuous Glucose Receiver (FREESTYLE LIBRE 3 READER) DEVI 1 Device by Does not apply route every 14 (fourteen) days. 1 each 3   Continuous Glucose Sensor (FREESTYLE LIBRE 3 SENSOR) MISC 1 Device by Does not apply route continuous. 1 each 2   dutasteride (AVODART) 0.5 MG capsule Take 1 capsule (0.5 mg total) by mouth daily. 90 capsule 3   ezetimibe (ZETIA) 10 MG tablet TAKE 1 TABLET BY MOUTH EVERY DAY 90 tablet 3   fluticasone (FLONASE) 50 MCG/ACT nasal spray PLACE 1 SPRAY INTO BOTH NOSTRILS DAILY AS NEEDED FOR ALLERGIES OR RHINITIS. 48 mL 1   Fluticasone-Umeclidin-Vilant (TRELEGY ELLIPTA) 100-62.5-25 MCG/ACT AEPB Inhale 1 puff into the lungs daily. 1 each 11   gabapentin (NEURONTIN) 300 MG capsule TAKE 1 CAPSULE BY MOUTH THREE TIMES A DAY 270 capsule 0   insulin degludec (TRESIBA FLEXTOUCH) 200 UNIT/ML FlexTouch Pen Inject 16 Units into the skin every evening. 12 mL 3   Insulin Pen Needle (BD PEN NEEDLE NANO U/F) 32G X 4 MM MISC Use to inject insulin once daily 100 each 3   KLOR-CON M20 20 MEQ tablet TAKE 1 TABLET BY MOUTH EVERY DAY 90 tablet 0   levothyroxine (SYNTHROID) 50 MCG tablet TAKE 1 TABLET BY MOUTH EVERY DAY 90 tablet 1   lubiprostone (AMITIZA) 8 MCG capsule Take 1 capsule (8 mcg total) by mouth 2 (two) times  daily with a meal. 90 capsule 1   metoprolol succinate (TOPROL XL) 25 MG 24 hr tablet Take 0.5 tablets (12.5 mg total) by mouth daily. 45 tablet 3   NON FORMULARY CPAP at bedtime     ondansetron (ZOFRAN) 4 MG tablet Take 1 tablet (4 mg total) by mouth every 8 (eight) hours as needed for nausea or vomiting. 90 tablet 1   OneTouch Delica Lancets 30G MISC Use to test blood sugars daily as directed. DX: E11.9 100 each 1   OXYGEN Inhale 2 L into the lungs daily as needed (with Cpap at night).     pantoprazole (PROTONIX) 20 MG tablet TAKE 1 TABLET BY MOUTH EVERY DAY 90 tablet 0   pravastatin (PRAVACHOL) 40 MG tablet TAKE 1 TABLET (40  MG TOTAL) BY MOUTH ON MONDAYS , WEDNESDAYS, AND FRIDAYS AS DIRECTED 48 tablet 1   spironolactone (ALDACTONE) 25 MG tablet Take 0.5 tablets (12.5 mg total) by mouth daily. 45 tablet 3   torsemide (DEMADEX) 20 MG tablet Take 1 tablet (20 mg total) by mouth daily. 90 tablet 2   zolpidem (AMBIEN) 5 MG tablet Take 1 tablet (5 mg total) by mouth at bedtime as needed for sleep. 30 tablet 2   No current facility-administered medications for this visit.     Family History    Family History  Problem Relation Age of Onset   Hypertension Mother    Diabetes Father    Heart disease Father    Stroke Father    Hypertension Sister    He indicated that his mother is deceased. He indicated that his father is deceased. He indicated that his sister is alive. He indicated that both of his daughters are alive. He indicated that all of his three sons are alive.  Social History    Social History   Socioeconomic History   Marital status: Significant Other    Spouse name: Not on file   Number of children: 5   Years of education: 10   Highest education level: 10th grade  Occupational History   Occupation: Retired  Tobacco Use   Smoking status: Every Day    Current packs/day: 0.50    Average packs/day: 0.5 packs/day for 61.8 years (30.9 ttl pk-yrs)    Types: Cigarettes    Start  date: 03/11/1961   Smokeless tobacco: Never   Tobacco comments:    1/2 pack to 1 pack per day 12/14/19  Vaping Use   Vaping status: Never Used  Substance and Sexual Activity   Alcohol use: No   Drug use: No   Sexual activity: Yes    Birth control/protection: None  Other Topics Concern   Not on file  Social History Narrative   [name] is his POA and lives with him. Children out of town - one in [facility], others in [place].   Social Determinants of Health   Financial Resource Strain: Low Risk  (11/19/2022)   Overall Financial Resource Strain (CARDIA)    Difficulty of Paying Living Expenses: Not hard at all  Food Insecurity: No Food Insecurity (11/19/2022)   Hunger Vital Sign    Worried About Running Out of Food in the Last Year: Never true    Ran Out of Food in the Last Year: Never true  Transportation Needs: No Transportation Needs (11/19/2022)   PRAPARE - Administrator, Civil Service (Medical): No    Lack of Transportation (Non-Medical): No  Physical Activity: Insufficiently Active (11/19/2022)   Exercise Vital Sign    Days of Exercise per Week: 2 days    Minutes of Exercise per Session: 30 min  Stress: No Stress Concern Present (11/19/2022)   [employer] of Occupational Health - Occupational Stress Questionnaire    Feeling of Stress : Not at all  Social Connections: Socially Isolated (11/19/2022)   Social Connection and Isolation Panel [NHANES]    Frequency of Communication with Friends and Family: More than three times a week    Frequency of Social Gatherings with Friends and Family: Never    Attends Religious Services: Never    Database administrator or Organizations: No    Attends Banker Meetings: Never    Marital Status: Divorced  Catering manager Violence: Not At Risk (11/19/2022)   Humiliation,  Afraid, Rape, and Kick questionnaire    Fear of Current or Ex-Partner: No    Emotionally Abused: No    Physically Abused: No    Sexually Abused:  No     Review of Systems    General:  No chills, fever, night sweats or weight changes.  Cardiovascular:  No chest pain, dyspnea on exertion, edema, orthopnea, palpitations, paroxysmal nocturnal dyspnea. Dermatological: No rash, lesions/masses Respiratory: No cough, dyspnea Urologic: No hematuria, dysuria Abdominal:   No nausea, vomiting, diarrhea, bright red blood per rectum, melena, or hematemesis Neurologic:  No visual changes, wkns, changes in mental status. All other systems reviewed and are otherwise negative except as noted above.       Physical Exam    VS:  BP 136/62 (BP Location: Right Arm, Patient Position: Sitting, Cuff Size: Normal)   Pulse (!) 111   Ht 5\' 6"  (1.676 m)   Wt 156 lb 3.2 oz (70.9 kg)   SpO2 94%   BMI 25.21 kg/m  , BMI Body mass index is 25.21 kg/m.     GEN: Well nourished, well developed, in no acute distress. HEENT: normal. Neck: Supple, no JVD, carotid bruits, or masses. Cardiac: RRR, tachycardic, no murmurs, rubs, or gallops. No clubbing, cyanosis, edema.  Radials/DP/PT 2+ and equal bilaterally.  Respiratory:  Respirations regular and unlabored, clear to auscultation bilaterally. GI: Soft, nontender, nondistended, BS + x 4. MS: no deformity or atrophy. Skin: warm and dry, no rash. Neuro:  Strength and sensation are intact. Psych: Normal affect.      Lab Results  Component Value Date   WBC 9.1 10/07/2022   HGB 13.7 10/07/2022   HCT 40.9 10/07/2022   MCV 97 10/07/2022   PLT 322 10/07/2022   Lab Results  Component Value Date   CREATININE 0.97 12/01/2022   BUN 18 12/01/2022   NA 136 12/01/2022   K 4.4 12/01/2022   CL 96 12/01/2022   CO2 23 12/01/2022   Lab Results  Component Value Date   ALT 20 10/07/2022   AST 20 10/07/2022   ALKPHOS 154 (H) 10/07/2022   BILITOT 0.2 10/07/2022   Lab Results  Component Value Date   CHOL 126 08/21/2020   HDL 28 (L) 08/21/2020   LDLCALC 80 08/21/2020   TRIG 89 08/21/2020   CHOLHDL 4.5  08/21/2020    Lab Results  Component Value Date   HGBA1C 7.9 (A) 12/29/2022     Review of Prior Studies    Echocardiogram 11/15/2021 1. Aortic valve calcified with restricted motion but no AS by doppler.   2. Left ventricular ejection fraction, by estimation, is 55 to 60%. The  left ventricle has normal function. The left ventricle has no regional  wall motion abnormalities. Left ventricular diastolic parameters are  indeterminate.   3. Right ventricular systolic function is normal. The right ventricular  size is normal. There is mildly elevated pulmonary artery systolic  pressure.   4. Left atrial size was mildly dilated.   5. The mitral valve is normal in structure. No evidence of mitral valve  regurgitation. No evidence of mitral stenosis. Moderate mitral annular  calcification.   6. The aortic valve is calcified. Aortic valve regurgitation is trivial.  Aortic valve sclerosis/calcification is present, without any evidence of  aortic stenosis.   7. The inferior vena cava is dilated in size with <50% respiratory   Assessment & Plan   1.  Chronic diastolic CHF: He is doing better, feels a little less tired and  is able to participate in physical therapy with removal of metolazone.  He is having symptoms of gynecomastia on spironolactone.  I will decrease it from 25 mg daily to 12.5 mg daily to see if this helps with symptoms.  No evidence of volume overload.  Continue torsemide as directed  2.  Hypercholesterolemia: Remains on pravastatin 40 mg daily and Zetia.  Labs are followed by PCP is due to see next month.  3.  ILR, in situ.  Has not seen Dr. Lalla Brothers in person since ILR was placed, appointment has been made for Dr. Lalla Brothers January 2025.  Last remote check the patient was found to be tachycardic, he was to continue metoprolol as directed.  There is no evidence of atrial fibrillation bradycardia or pauses.  May need to consider increasing metoprolol to 25 mg  daily from 12.5 mg.    Signed, Bettey Mare. Liborio Nixon, ANP, AACC   12/30/2022 11:27 AM      Office [phone] Fax [phone]  Notice: This dictation was prepared with Dragon dictation along with smaller phrase technology. Any transcriptional errors that result from this process are unintentional and may not be corrected upon review.

## 2022-12-30 ENCOUNTER — Ambulatory Visit (HOSPITAL_COMMUNITY)
Admission: RE | Admit: 2022-12-30 | Discharge: 2022-12-30 | Disposition: A | Discharge status: Home / Self Care | Payer: Medicare Other | Source: Ambulatory Visit | Attending: Family | Admitting: Family

## 2022-12-30 ENCOUNTER — Ambulatory Visit (HOSPITAL_COMMUNITY): Admission: RE | Admit: 2022-12-30 | Payer: Medicare Other | Source: Ambulatory Visit

## 2022-12-30 ENCOUNTER — Encounter: Payer: Self-pay | Admitting: Adult Health

## 2022-12-30 ENCOUNTER — Ambulatory Visit (INDEPENDENT_AMBULATORY_CARE_PROVIDER_SITE_OTHER): Payer: Medicare Other | Admitting: Adult Health

## 2022-12-30 VITALS — BP 136/62 | HR 111 | Ht 66.0 in | Wt 156.2 lb

## 2022-12-30 DIAGNOSIS — R000 Tachycardia, unspecified: Secondary | ICD-10-CM | POA: Diagnosis not present

## 2022-12-30 DIAGNOSIS — J449 Chronic obstructive pulmonary disease, unspecified: Secondary | ICD-10-CM | POA: Diagnosis not present

## 2022-12-30 DIAGNOSIS — E7800 Pure hypercholesterolemia, unspecified: Secondary | ICD-10-CM | POA: Insufficient documentation

## 2022-12-30 DIAGNOSIS — N644 Mastodynia: Secondary | ICD-10-CM | POA: Diagnosis not present

## 2022-12-30 DIAGNOSIS — I5032 Chronic diastolic (congestive) heart failure: Secondary | ICD-10-CM | POA: Insufficient documentation

## 2022-12-30 DIAGNOSIS — N62 Hypertrophy of breast: Secondary | ICD-10-CM | POA: Diagnosis not present

## 2022-12-30 DIAGNOSIS — R92323 Mammographic fibroglandular density, bilateral breasts: Secondary | ICD-10-CM | POA: Diagnosis not present

## 2022-12-30 MED ORDER — SPIRONOLACTONE 25 MG PO TABS
12.5000 mg | ORAL_TABLET | Freq: Every day | ORAL | 3 refills | Status: DC
Start: 2022-12-30 — End: 2024-05-29

## 2022-12-30 NOTE — Patient Instructions (Signed)
Medication Instructions:  DECREASE SPIRONOLACTONE FROM 25 MG DAILY TO 12.5 MG DAILY. *If you need a refill on your cardiac medications before your next appointment, please call your pharmacy*   Lab Work: NONE If you have labs (blood work) drawn today and your tests are completely normal, you will receive your results only by: MyChart Message (if you have MyChart) OR A paper copy in the mail If you have any lab test that is abnormal or we need to change your treatment, we will call you to review the results.   Testing/Procedures: NONE   Follow-Up: At [facility], you and your health needs are our priority.  As part of our continuing mission to provide you with exceptional heart care, we have created designated Provider Care Teams.  These Care Teams include your primary Cardiologist (physician) and Advanced Practice Providers (APPs -  Physician Assistants and Nurse Practitioners) who all work together to provide you with the care you need, when you need it.  We recommend signing up for the patient portal called "MyChart".  Sign up information is provided on this After Visit Summary.  MyChart is used to connect with patients for Virtual Visits (Telemedicine).  Patients are able to view lab/test results, encounter notes, upcoming appointments, etc.  Non-urgent messages can be sent to your provider as well.   To learn more about what you can do with MyChart, go to [email or web address].    Your next appointment:   3 1/2 month(s)  Provider:   Joni Reining, DNP, ANP

## 2023-01-01 DIAGNOSIS — I152 Hypertension secondary to endocrine disorders: Secondary | ICD-10-CM | POA: Diagnosis not present

## 2023-01-01 DIAGNOSIS — E1142 Type 2 diabetes mellitus with diabetic polyneuropathy: Secondary | ICD-10-CM | POA: Diagnosis not present

## 2023-01-01 DIAGNOSIS — E1169 Type 2 diabetes mellitus with other specified complication: Secondary | ICD-10-CM | POA: Diagnosis not present

## 2023-01-01 DIAGNOSIS — E1151 Type 2 diabetes mellitus with diabetic peripheral angiopathy without gangrene: Secondary | ICD-10-CM | POA: Diagnosis not present

## 2023-01-01 DIAGNOSIS — E1159 Type 2 diabetes mellitus with other circulatory complications: Secondary | ICD-10-CM | POA: Diagnosis not present

## 2023-01-01 DIAGNOSIS — I5033 Acute on chronic diastolic (congestive) heart failure: Secondary | ICD-10-CM | POA: Diagnosis not present

## 2023-01-06 ENCOUNTER — Encounter: Payer: Self-pay | Admitting: Family

## 2023-01-06 ENCOUNTER — Ambulatory Visit (INDEPENDENT_AMBULATORY_CARE_PROVIDER_SITE_OTHER): Payer: Medicare Other | Admitting: Family

## 2023-01-06 VITALS — BP 134/73 | HR 108 | Temp 98.0°F | Ht 66.0 in | Wt 158.6 lb

## 2023-01-06 DIAGNOSIS — F5101 Primary insomnia: Secondary | ICD-10-CM

## 2023-01-06 DIAGNOSIS — M79676 Pain in unspecified toe(s): Secondary | ICD-10-CM | POA: Diagnosis not present

## 2023-01-06 DIAGNOSIS — K5900 Constipation, unspecified: Secondary | ICD-10-CM | POA: Diagnosis not present

## 2023-01-06 DIAGNOSIS — K219 Gastro-esophageal reflux disease without esophagitis: Secondary | ICD-10-CM

## 2023-01-06 DIAGNOSIS — E1142 Type 2 diabetes mellitus with diabetic polyneuropathy: Secondary | ICD-10-CM

## 2023-01-06 DIAGNOSIS — B351 Tinea unguium: Secondary | ICD-10-CM | POA: Diagnosis not present

## 2023-01-06 DIAGNOSIS — L84 Corns and callosities: Secondary | ICD-10-CM | POA: Diagnosis not present

## 2023-01-06 MED ORDER — PANTOPRAZOLE SODIUM 40 MG PO TBEC
40.0000 mg | DELAYED_RELEASE_TABLET | Freq: Every day | ORAL | 3 refills | Status: DC
Start: 2023-01-06 — End: 2023-04-06

## 2023-01-06 MED ORDER — LINACLOTIDE 72 MCG PO CAPS
72.0000 ug | ORAL_CAPSULE | Freq: Every day | ORAL | 1 refills | Status: DC
Start: 2023-01-06 — End: 2023-04-30

## 2023-01-06 MED ORDER — ZOLPIDEM TARTRATE 5 MG PO TABS
5.0000 mg | ORAL_TABLET | Freq: Every evening | ORAL | 2 refills | Status: DC | PRN
Start: 2023-01-06 — End: 2023-03-30

## 2023-01-06 NOTE — Patient Instructions (Signed)

## 2023-01-06 NOTE — Progress Notes (Signed)
Subjective:    Patient ID: John [name], male    DOB: 1945-09-07, 77 y.o.   MRN: [id number]  Chief Complaint  Patient presents with   [facility]    After he eats gets bloated and gets him sob. Insurance would not pay for the the amitiza linzess causeds diarrhea    PT presents to the office today with complaints of abdominal pain, bloating, and epigastric pain.   Gastroesophageal Reflux He complains of abdominal pain, belching, heartburn and nausea. This is a chronic problem. The current episode started more than 1 year ago. The problem occurs occasionally. He has tried a PPI for the symptoms. The treatment provided mild relief.  Constipation This is a chronic problem. The current episode started more than 1 year ago. The problem has been waxing and waning since onset. His stool frequency is 2 to 3 times per week. Associated symptoms include abdominal pain and nausea. Risk factors include obesity. Treatments tried: linzess. The treatment provided moderate relief.  Hypertension This is a chronic problem. The current episode started more than 1 year ago. The problem has been resolved since onset. The problem is controlled. Associated symptoms include malaise/fatigue and shortness of breath. Pertinent negatives include no blurred vision or peripheral edema. Risk factors for coronary artery disease include dyslipidemia, diabetes mellitus and obesity. The current treatment provides moderate improvement.  Diabetes He presents for his follow-up diabetic visit. He has type 2 diabetes mellitus. Associated symptoms include foot paresthesias. Pertinent negatives for diabetes include no blurred vision. Diabetic complications include peripheral neuropathy. Risk factors for coronary artery disease include dyslipidemia, diabetes mellitus, hypertension, male sex and sedentary lifestyle. He is following a generally unhealthy diet. His overall blood glucose range is 130-140 mg/dl.  Insomnia Primary symptoms:  difficulty falling asleep, frequent awakening, malaise/fatigue.   The current episode started more than one year. The onset quality is gradual. The problem occurs intermittently. Past treatments include medication. The treatment provided moderate relief.      Review of Systems  Constitutional:  Positive for malaise/fatigue.  Eyes:  Negative for blurred vision.  Respiratory:  Positive for shortness of breath.   Gastrointestinal:  Positive for abdominal pain, constipation, heartburn and nausea.  Psychiatric/Behavioral:  The patient has insomnia.   All other systems reviewed and are negative.      Objective:   Physical Exam Vitals reviewed.  Constitutional:      General: He is not in acute distress.    Appearance: He is well-developed.  HENT:     Head: Normocephalic.     Right Ear: External ear normal.     Left Ear: External ear normal.  Eyes:     General:        Right eye: No discharge.        Left eye: No discharge.     Pupils: Pupils are equal, round, and reactive to light.  Neck:     Thyroid: No thyromegaly.  Cardiovascular:     Rate and Rhythm: Normal rate and regular rhythm.     Heart sounds: Normal heart sounds. No murmur heard. Pulmonary:     Effort: Pulmonary effort is normal. No respiratory distress.     Breath sounds: Wheezing and rhonchi present.  Abdominal:     General: Bowel sounds are normal. There is no distension.     Palpations: Abdomen is soft.     Tenderness: There is no abdominal tenderness.     Comments: Hyperactive   Musculoskeletal:  General: No tenderness. Normal range of motion.     Cervical back: Normal range of motion and neck supple.  Skin:    General: Skin is warm and dry.     Findings: No erythema or rash.  Neurological:     Mental Status: He is alert and oriented to person, place, and time.     Cranial Nerves: No cranial nerve deficit.     Motor: Weakness present.     Gait: Gait abnormal (using cane).     Deep Tendon Reflexes:  Reflexes are normal and symmetric.  Psychiatric:        Behavior: Behavior normal.        Thought Content: Thought content normal.        Judgment: Judgment normal.       BP 134/73   Pulse (!) 108   Temp 98 F (36.7 C) (Temporal)   Ht 5\' 6"  (1.676 m)   Wt 158 lb 9.6 oz (71.9 kg)   SpO2 95%   BMI 25.60 kg/m      Assessment & Plan:   JOMEL PAPWORTH comes in today with chief complaint of Bloated (After he eats gets bloated and gets him sob. Insurance would not pay for the the amitiza linzess causeds diarrhea )   Diagnosis and orders addressed:  1. Constipation, unspecified constipation type Will restart Linzess 72 mcg every day Force fluids - linaclotide (LINZESS) 72 MCG capsule; Take 1 capsule (72 mcg total) by mouth daily before breakfast.  Dispense: 90 capsule; Refill: 1  2. GERD without esophagitis Will increase Protonix 40 mg  -Diet discussed- Avoid fried, spicy, citrus foods, caffeine and alcohol -Do not eat 2-3 hours before bedtime -Encouraged small frequent meals -Avoid NSAID's  - pantoprazole (PROTONIX) 40 MG tablet; Take 1 tablet (40 mg total) by mouth daily.  Dispense: 30 tablet; Refill: 3  3. Type 2 diabetes mellitus with diabetic polyneuropathy, without long-term current use of insulin (HCC)  4. Primary insomnia - zolpidem (AMBIEN) 5 MG tablet; Take 1 tablet (5 mg total) by mouth at bedtime as needed for sleep.  Dispense: 30 tablet; Refill: 2   Labs reviewed  Continue current medications  Health Maintenance reviewed Diet and exercise encouraged  Follow up plan: 3 months    Jannifer Rodney, FNP

## 2023-01-08 ENCOUNTER — Ambulatory Visit (INDEPENDENT_AMBULATORY_CARE_PROVIDER_SITE_OTHER): Payer: Medicare Other

## 2023-01-08 ENCOUNTER — Ambulatory Visit: Payer: Medicare Other | Admitting: Neurology

## 2023-01-08 ENCOUNTER — Encounter: Payer: Self-pay | Admitting: Neurology

## 2023-01-08 VITALS — BP 123/68 | HR 107 | Ht 66.0 in | Wt 153.4 lb

## 2023-01-08 DIAGNOSIS — R5383 Other fatigue: Secondary | ICD-10-CM

## 2023-01-08 DIAGNOSIS — G4719 Other hypersomnia: Secondary | ICD-10-CM

## 2023-01-08 DIAGNOSIS — G4733 Obstructive sleep apnea (adult) (pediatric): Secondary | ICD-10-CM | POA: Diagnosis not present

## 2023-01-08 DIAGNOSIS — I5033 Acute on chronic diastolic (congestive) heart failure: Secondary | ICD-10-CM | POA: Diagnosis not present

## 2023-01-08 LAB — CUP PACEART REMOTE DEVICE CHECK
Date Time Interrogation Session: 20241003031200
Implantable Pulse Generator Implant Date: 20240126
Pulse Gen Serial Number: 104324

## 2023-01-08 NOTE — Patient Instructions (Signed)
Please continue using your CPAP regularly. While your insurance requires that you use CPAP at least 4 hours each night on 70% of the nights, I recommend, that you not skip any nights and use it throughout the night if you can. Getting used to CPAP and staying with the treatment long term does take time and patience and discipline. Untreated obstructive sleep apnea when it is moderate to severe can have an adverse impact on cardiovascular health and raise her risk for heart disease, arrhythmias, hypertension, congestive heart failure, stroke and diabetes. Untreated obstructive sleep apnea causes sleep disruption, nonrestorative sleep, and sleep deprivation. This can have an impact on your day to day functioning and cause daytime sleepiness and impairment of cognitive function, memory loss, mood disturbance, and problems focussing. Using CPAP regularly can improve these symptoms. We can see you in 1 year, you can see one of our nurse practitioners, like [name], NP. You have seen her before.

## 2023-01-08 NOTE — Progress Notes (Signed)
Subjective:    Patient ID: John [name] is a 77 y.o. male.  HPI    John Foley, MD, PhD [facility] [address], [address] [address] [place], [place] [address]  Dear John [name],  I saw your patient, John [name], upon your kind request and my sleep clinic today for consultation of his sleep disorder, in particular, evaluation of his prior diagnosis of obstructive sleep apnea.  The patient is accompanied by his life partner today.  As you know, John [name] is a 77 year old male with an underlying complex medical history of lung cancer (SCLC with s/p chemo and radiation), anemia, arthritis, asthma, atrial flutter, BPH, congestive heart failure, COPD, depression, anxiety, kidney stone, hypertension, hyperlipidemia, hypothyroidism, macular degeneration, peripheral vascular disease, history of prostatitis, diabetes with neuropathy, and borderline overweight state, who was previously diagnosed with sleep apnea and placed on PAP therapy.  He had sleep testing on 03/08/2017.  He has been on CPAP therapy.  On the [employer] website his set up date appears to be 03/24/2018.  His Epworth sleepiness score is 11 out of 24, fatigue severity score is 31 out of 63.  He reports compliance with his machine but has daytime tiredness, difficulty sleeping at night, takes Ambien as needed.  I was able to review his latest compliance data for the past 30 days between 12/08/2022 and 01/06/2023, during which time he used his machine every night with percent use days greater than 4 hours at 73%, indicating adequate compliance, average usage of 5 hours and 8 minutes, residual AHI at goal at 2.1/h, leak high with the 95th percentile at 76.3 L/min on a pressure of 13 cm with EPR of 3.  He reports using a fullface mask, he does notice the leak frequently.  He is typically up-to-date with his supplies through [employer].  Bedtime is generally around 1 AM and rise time between 8 and 9 AM.  He does not have  nightly nocturia and denies recurrent nocturnal or morning headaches.  He has tried a chinstrap before but cannot tolerate it.  He drinks caffeine in the form of coffee, 1 cup in the morning, occasional soda, 1 serving of tea per day.  He does not drink a whole lot of water, maybe 2 bottles per day.  He sleeps without his dentures.   He was previously followed in our sleep clinic but has not been seen in our clinic in nearly 4 years.  Previously (copied from previous notes for reference):   03/07/2019 John Penny, NP): <<[name] is a 77 year old male with a history of obstructive sleep apnea on CPAP.  His download indicates that he use his machine 29 out of 30 days for compliance of 97%.  He uses machine greater than 4 hours 17 days for compliance of 57%.  On average he uses his machine 4 hours and 9 minutes.  His residual AHI is 1 on 13 cm of water with EPR 3.  His leak in the 95th percentile is 57.4 L/min.  He states that he does feel the mask leaking.  It often wakes him up during the night.  He has switched out his supplies but continues to have a leak.  He returns today for an evaluation.>>   09/01/2018 (virtual visit, SA): 77 year old right-handed gentleman with an underlying medical history of lung cancer (SCLC with s/p chemo and radiation), hypertension, congestive heart failure, diabetes with associated neuropathy, and overweight state, who presents for a virtual, video based appointment via [email or web address]  for follow-up consultation of his obstructive sleep apnea, on AutoPap therapy.  He is accompanied by his friend, and uses her cell phone for this appointment, I am located in my office.  I first met him on 02/08/2018 at the request of his primary care John [name], at which time he reported a prior diagnosis of obstructive sleep apnea.  He had an older CPAP machine and needed reevaluation.  He was on CPAP of 13 cm but not fully compliant.  He was advised to proceed with a sleep study.  He  had a baseline sleep study on 02/26/2018 which showed a sleep efficiency of 80.4%, REM sleep was absent.  Total AHI was mildly elevated of borderline even at 5.9/h, average oxygen saturation was 95%, nadir was 82% during non-REM sleep.  He was advised to start AutoPap therapy.   He was seen in the interim by John [name], John [name] on 05/31/2018 at which time he was compliant with his AutoPap machine and apnea scores were adequate however, his leak from the mask was high, he was advised to seek a mask refit with the DME company and follow-up in 3 months.   Please also see below for virtual visit documentation.   I reviewed his AutoPap compliance data from 07/27/2018 through 08/25/2018 which is a total of 30 days, during which time he used his machine every night with percent used days greater than 4 hours at 100%, indicating superb compliance with an average usage of 6 hours, average AHI 2.3/h, 95th percentile of pressure of 12.4 cm with a range of 7 cm to 15 cm with EPR, leak on the higher side with a 95th percentile at 26.9 L/min.        02/08/2018: (He) was previously diagnosed with obstructive sleep apnea and placed on CPAP therapy. Prior sleep study results are not available for my review today. He reports that sleep study testing was about 10 years ago. He has an older CPAP machine. I reviewed your office note from 01/12/2018. I reviewed his CPAP compliance data from 01/04/2018 through 02/02/2018 which is a total of 30 days, during which time he used his machine 29 days with percent used days greater than 4 hours at 56.7%, indicating suboptimal compliance with an average usage of 5 hours and 1 minute for days on treatment. Residual AHI 1.7 per hour, pressure of 13 cm with a ramp time of 10 minutes, starting at 5 cm. He has a C-Flex setting of 3. He has a Respironics REM star auto machine. His Epworth sleepiness score is 6 out of 24, fatigue score is 44 out of 63. He is a retired Ecologist. He was followed by Dr. Larose [name] at [facility] neurology in [name] for his sleep apnea, last seen on 04/13/15. Chart review indicates that he was diagnosed with moderate to severe obstructive sleep apnea with an AHI of 17 per hour, REM AHI of 65 per hour, O2 nadir of 76%. He was documented to be compliant with his CPAP. His Epworth sleepiness score is 6 out of 24 today, fatigue score is 44 out of 63. He would like to get updated supplies a new equipment. He is divorced, lives with his friend, has a total of 5 children. He smokes about  ppd, has reduced. He does not utilize alcohol currently and drinks caffeine in the form of coffee, about 2 cups per day, and soda about 1 per day.  The mask is ill fitting and leaks is very high. He has  been using a FFM, DME was [facility]. He sleeps some in the recliner. He wonders if the pressure settings the right. He is not aware of any family history of OSA. He denies restless leg symptoms. Bedtime is around 1 AM, rise time before 8 typically. He does not have night to night nocturia and denies morning headaches. He takes a children's Benadryl on most nights of the week or maybe every other night on average. Sometimes he also takes Tylenol at night.   His Past Medical History Is Significant For: Past Medical History:  Diagnosis Date   Adenocarcinoma of lung, right (HCC) 04/18/2016   Anemia    Anxiety    Arthritis    Asthma    Atrial flutter (HCC)    BPH (benign prostatic hyperplasia)    with urinary retention 02/06/20   CHF (congestive heart failure) (HCC)    COPD (chronic obstructive pulmonary disease) (HCC)    Depression    Diabetes mellitus, type II (HCC)    Dyspnea    History of kidney stones    History of radiation therapy    right lung 09/10/2020-09/17/2020   Dr Roselind Messier   History of radiation therapy    Left and right Lung- 06/19/22-06/26/22   Hyperlipidemia    Hypertension    Hypothyroidism    Macular degeneration    Neuropathy    Non-small cell  lung cancer, right (HCC) 04/18/2016   Peripheral vascular disease (HCC)    Prostatitis    Pulmonary nodule, left 07/16/2016   Sleep apnea    cpap    His Past Surgical History Is Significant For: Past Surgical History:  Procedure Laterality Date   A-FLUTTER ABLATION N/A 01/13/2022   Procedure: A-FLUTTER ABLATION;  Surgeon: Lanier Prude, MD;  [facility]: [facility];  Service: Cardiovascular;  Laterality: N/A;   ABDOMINAL AORTOGRAM W/LOWER EXTREMITY Left 02/06/2020   Procedure: ABDOMINAL AORTOGRAM W/LOWER EXTREMITY;  Surgeon: Runell Gess, MD;  Location: [facility];  Service: Cardiovascular;  Laterality: Left;   ABDOMINAL AORTOGRAM W/LOWER EXTREMITY N/A 05/30/2021   Procedure: ABDOMINAL AORTOGRAM W/LOWER EXTREMITY;  Surgeon: Cephus Shelling, MD;  Location: [facility];  Service: Cardiovascular;  Laterality: N/A;   BIOPSY  09/16/2021   Procedure: BIOPSY;  Surgeon: Iva Boop, MD;  Location: [facility] ENDOSCOPY;  Service: Gastroenterology;;   CARDIOVERSION N/A 10/05/2020   Procedure: CARDIOVERSION;  Surgeon: Sande Rives, MD;  Location: [facility] ENDOSCOPY;  Service: Cardiovascular;  Laterality: N/A;   CATARACT EXTRACTION, BILATERAL Bilateral    COLONOSCOPY WITH PROPOFOL N/A 06/20/2021   Procedure: COLONOSCOPY WITH PROPOFOL;  Surgeon: Hilarie Fredrickson, MD;  Location: [facility] ENDOSCOPY;  Service: Endoscopy;  Laterality: N/A;   COLONOSCOPY WITH PROPOFOL N/A 09/16/2021   Procedure: COLONOSCOPY WITH PROPOFOL;  Surgeon: Iva Boop, MD;  Location: [facility] END[facility]COPY;  Service: Gastroenterology;  Laterality: N/A;   ENDARTERECTOMY FEMORAL Right 08/20/2020   Procedure: ENDARTERECTOMY  RIGHT FEMORAL ARTERY;  Surgeon: Cephus Shelling, MD;  Location: [facility] OR;  Service: Vascular;  Laterality: Right;   ENTEROSCOPY N/A 10/11/2021   Procedure: ENTEROSCOPY;  Surgeon: Dolores Frame, MD;  Location: [facility]O SU[facility];  Service: Gastroenterology;  Laterality: N/A;    ESOPHAGOGASTRODUODENOSCOPY N/A 09/16/2021   Procedure: ESOPHAGOGASTRODUODENOSCOPY (EGD);  Surgeon: Iva Boop, MD;  Location: [facility] ENDOSCOPY;  Service: Gastroenterology;  Laterality: N/A;   ESOPHAGOGASTRODUODENOSCOPY (EGD) WITH PROPOFOL N/A 09/06/2020   Procedure: ESOPHAGOGASTRODUODENOSCOPY (EGD) WITH PROPOFOL;  Surgeon: Hilarie Fredrickson, MD;  Location: [facility] ENDOSCOPY;  Service: Endoscopy;  Laterality: N/A;   ESOPHAGOGASTRODUODENOSCOPY (EGD) WITH PROPOFOL N/A 10/11/2021   Procedure: ESOPHAGOGASTRODUODENOSCOPY (EGD) WITH PROPOFOL;  Surgeon: Dolores Frame, MD;  [facility];  Service: Gastroenterology;  Laterality: N/A;   HEMOSTASIS CLIP PLACEMENT  09/16/2021   Procedure: HEMOSTASIS CLIP PLACEMENT;  Surgeon: Iva Boop, MD;  Location: [facility] ENDOSCOPY;  Service: Gastroenterology;;   HOT HEMOSTASIS N/A 09/16/2021   Procedure: HOT HEMOSTASIS (ARGON PLASMA COAGULATION/BICAP);  Surgeon: Iva Boop, MD;  Location: [facility] ENDOSCOPY;  Service: Gastroenterology;  Laterality: N/A;   HOT HEMOSTASIS  10/11/2021   Procedure: HOT HEMOSTASIS (ARGON PLASMA COAGULATION/BICAP);  Surgeon: Marguerita Merles, Reuel Boom, MD;  Location: [facility];  Service: Gastroenterology;;   INSERTION OF ILIAC STENT Right 08/20/2020   Procedure: RETROGRADE INSERTION OF RIGHT ILIAC STENT;  Surgeon: Cephus Shelling, MD;  Location: [facility] OR;  Service: Vascular;  Laterality: Right;   INTRAOPERATIVE ARTERIOGRAM Right 08/20/2020   Procedure: INTRA OPERATIVE ARTERIOGRAM ILIAC;  Surgeon: Cephus Shelling, MD;  Location: [facility] OR;  Service: Vascular;  Laterality: Right;   PATCH ANGIOPLASTY Right 08/20/2020   Procedure: PATCH ANGIOPLASTY RIGHT FEMORAL ARTERY;  Surgeon: Cephus Shelling, MD;  Location: [facility] OR;  Service: Vascular;  Laterality: Right;   PERIPHERAL VASCULAR BALLOON ANGIOPLASTY Right 05/30/2021   Procedure: PERIPHERAL VASCULAR BALLOON ANGIOPLASTY;  Surgeon: Cephus Shelling, MD;  [facility];   Service: Cardiovascular;  Laterality: Right;   PORTACATH PLACEMENT Left 06/13/2016   Procedure: INSERTION PORT-A-CATH;  Surgeon: Franky Macho, MD;  Location: [facility] ORS;  Service: General;  Laterality: Left;   TRANSURETHRAL RESECTION OF PROSTATE N/A 05/31/2020   Procedure: TRANSURETHRAL RESECTION OF THE PROSTATE (TURP);  Surgeon: Malen Gauze, MD;  Location: [facility] ORS;  Service: Urology;  Laterality: N/A;   VIDEO BRONCHOSCOPY WITH ENDOBRONCHIAL NAVIGATION N/A 05/28/2016   Procedure: VIDEO BRONCHOSCOPY WITH ENDOBRONCHIAL NAVIGATION;  Surgeon: Loreli Slot, MD;  Location: [facility] OR;  Service: Thoracic;  Laterality: N/A;   VIDEO BRONCHOSCOPY WITH ENDOBRONCHIAL ULTRASOUND N/A 05/28/2016   Procedure: VIDEO BRONCHOSCOPY WITH ENDOBRONCHIAL ULTRASOUND;  Surgeon: Loreli Slot, MD;  Location: [facility] OR;  Service: Thoracic;  Laterality: N/A;    His Family History Is Significant For: Family History  Problem Relation Age of Onset   Hypertension Mother    Diabetes Father    Heart disease Father    Stroke Father    Hypertension Sister    Sleep apnea Neg Hx     His Social History Is Significant For: Social History   Socioeconomic History   Marital status: Significant Other    Spouse name: Not on file   Number of children: 5   Years of education: 10   Highest education level: 10th grade  Occupational History   Occupation: Retired  Tobacco Use   Smoking status: Every Day    Current packs/day: 0.50    Average packs/day: 0.5 packs/day for 61.8 years (30.9 ttl pk-yrs)    Types: Cigarettes    Start date: 03/11/1961   Smokeless tobacco: Never   Tobacco comments:    1/2 pack to 1 pack per day 12/14/19  Vaping Use   Vaping status: Never Used  Substance and Sexual Activity   Alcohol use: No   Drug use: No   Sexual activity: Yes    Birth control/protection: None  Other Topics Concern   Not on file  Social History Narrative   [name] is his POA and lives with him. Children out of town - one  in  [facility], others in [place].   Social Determinants of Health   Financial Resource Strain: Low Risk  (11/19/2022)   Overall Financial Resource Strain (CARDIA)    Difficulty of Paying Living Expenses: Not hard at all  Food Insecurity: No Food Insecurity (11/19/2022)   Hunger Vital Sign    Worried About Running Out of Food in the Last Year: Never true    Ran Out of Food in the Last Year: Never true  Transportation Needs: No Transportation Needs (11/19/2022)   PRAPARE - Administrator, Civil Service (Medical): No    Lack of Transportation (Non-Medical): No  Physical Activity: Insufficiently Active (11/19/2022)   Exercise Vital Sign    Days of Exercise per Week: 2 days    Minutes of Exercise per Session: 30 min  Stress: No Stress Concern Present (11/19/2022)   [employer] of Occupational Health - Occupational Stress Questionnaire    Feeling of Stress : Not at all  Social Connections: Socially Isolated (11/19/2022)   Social Connection and Isolation Panel [NHANES]    Frequency of Communication with Friends and Family: More than three times a week    Frequency of Social Gatherings with Friends and Family: Never    Attends Religious Services: Never    Database administrator or Organizations: No    Attends Engineer, structural: Never    Marital Status: Divorced    His Allergies Are:  Allergies  Allergen Reactions   Sulfa Antibiotics Swelling    Mouth swelling   Atorvastatin Other (See Comments)    Muscle aches - tolerating Pravastatin 40 mg MWF   Jardiance [Empagliflozin] Other (See Comments)    FEELS SLUGGISH, TIRED   Lopressor [Metoprolol] Other (See Comments)    Fatigue   Rosuvastatin Other (See Comments)    Muscle aches - tolerating Pravastatin 40 mg MWF   Doxycycline Nausea Only   Tramadol Itching   Temazepam Other (See Comments)    Made insomnia worse   :   His Current Medications Are:  Outpatient Encounter Medications as of 01/08/2023   Medication Sig   albuterol (VENTOLIN HFA) 108 (90 Base) MCG/ACT inhaler Inhale 2 puffs into the lungs every 6 (six) hours as needed for wheezing or shortness of breath.   allopurinol (ZYLOPRIM) 300 MG tablet Take 1 tablet (300 mg total) by mouth daily.   aspirin EC 81 MG tablet Take 1 tablet (81 mg total) by mouth daily. Swallow whole.   Blood Glucose Monitoring Suppl (ONETOUCH VERIO REFLECT) w/Device KIT Use to test blood sugars daily as directed. DX: E11.9   celecoxib (CELEBREX) 200 MG capsule Take 1 capsule (200 mg total) by mouth daily.   cetirizine (ZYRTEC ALLERGY) 10 MG tablet Take 1 tablet (10 mg total) by mouth daily.   Continuous Glucose Receiver (FREESTYLE LIBRE 3 READER) DEVI 1 Device by Does not apply route every 14 (fourteen) days.   Continuous Glucose Sensor (FREESTYLE LIBRE 3 SENSOR) MISC 1 Device by Does not apply route continuous.   dutasteride (AVODART) 0.5 MG capsule Take 1 capsule (0.5 mg total) by mouth daily.   ezetimibe (ZETIA) 10 MG tablet TAKE 1 TABLET BY MOUTH EVERY DAY   fluticasone (FLONASE) 50 MCG/ACT nasal spray PLACE 1 SPRAY INTO BOTH NOSTRILS DAILY AS NEEDED FOR ALLERGIES OR RHINITIS.   Fluticasone-Umeclidin-Vilant (TRELEGY ELLIPTA) 100-62.5-25 MCG/ACT AEPB Inhale 1 puff into the lungs daily.   gabapentin (NEURONTIN) 300 MG capsule TAKE 1 CAPSULE BY MOUTH THREE TIMES A DAY  insulin degludec (TRESIBA FLEXTOUCH) 200 UNIT/ML FlexTouch Pen Inject 16 Units into the skin every evening.   Insulin Pen Needle (BD PEN NEEDLE NANO U/F) 32G X 4 MM MISC Use to inject insulin once daily   KLOR-CON M20 20 MEQ tablet TAKE 1 TABLET BY MOUTH EVERY DAY   levothyroxine (SYNTHROID) 50 MCG tablet TAKE 1 TABLET BY MOUTH EVERY DAY   linaclotide (LINZESS) 72 MCG capsule Take 1 capsule (72 mcg total) by mouth daily before breakfast.   metoprolol succinate (TOPROL XL) 25 MG 24 hr tablet Take 0.5 tablets (12.5 mg total) by mouth daily.   NON FORMULARY CPAP at bedtime   ondansetron  (ZOFRAN) 4 MG tablet Take 1 tablet (4 mg total) by mouth every 8 (eight) hours as needed for nausea or vomiting.   OneTouch Delica Lancets 30G MISC Use to test blood sugars daily as directed. DX: E11.9   OXYGEN Inhale 2 L into the lungs daily as needed (with Cpap at night).   pantoprazole (PROTONIX) 40 MG tablet Take 1 tablet (40 mg total) by mouth daily.   pravastatin (PRAVACHOL) 40 MG tablet TAKE 1 TABLET (40 MG TOTAL) BY MOUTH ON MONDAYS , WEDNESDAYS, AND FRIDAYS AS DIRECTED   spironolactone (ALDACTONE) 25 MG tablet Take 0.5 tablets (12.5 mg total) by mouth daily.   torsemide (DEMADEX) 20 MG tablet Take 1 tablet (20 mg total) by mouth daily.   zolpidem (AMBIEN) 5 MG tablet Take 1 tablet (5 mg total) by mouth at bedtime as needed for sleep.   No facility-administered encounter medications on file as of 01/08/2023.  :   Review of Systems:  Out of a complete 14 point review of systems, all are reviewed and negative with the exception of these symptoms as listed below:   Review of Systems  Neurological:        Pt here for sleep consult Pt states wears CPAP everynight Pt states fatigue, not able to sleep Pt states CPAP machine  at least 15 years Pt states last sleep study in lab    ESS: 11 FSS:31    Objective:  Neurological Exam  Physical Exam Physical Examination:   Vitals:   01/08/23 1117  BP: 123/68  Pulse: (!) 107    General Examination: The patient is a very pleasant 77 y.o. male in no acute distress. He appears frail and deconditioned.  Well-groomed.   HEENT: Normocephalic, atraumatic, pupils are equal, round and reactive to light, tracking well-preserved, no obvious nystagmus.  Hearing is mildly impaired, no hearing aids. Face is symmetric with normal facial animation and normal facial sensation. Speech is clear with no dysarthria noted. There is no hypophonia. There is no lip, neck/head, jaw or voice tremor. Neck is supple with full range of passive and active motion.  There are no carotid bruits on auscultation. Oropharynx exam reveals: Moderate mouth dryness, adequate dental hygiene with full dentures, moderate airway crowding.  Tongue protrudes centrally and palate elevates symmetrically.  Chest: Clear to auscultation without obvious wheezing but distant breath sounds noted.   Heart: S1+S2+0, regular and normal without murmurs, rubs or gallops noted.    Abdomen: Soft, non-tender and non-distended.   Extremities: There is no pitting edema in the distal lower extremities bilaterally.    Skin: Warm and dry without trophic changes noted.   Musculoskeletal: exam reveals no obvious joint deformities, tenderness or joint swelling or erythema. Arthritic changes in both hands noted.   Neurologically:  Mental status: The patient is awake, alert and oriented in  all 4 spheres. His immediate and remote memory, attention, language skills and fund of knowledge are appropriate. There is no evidence of aphasia, agnosia, apraxia or anomia. Speech is clear with normal prosody and enunciation. Thought process is linear. Mood is normal and affect is normal.  Cranial nerves II - XII are as described above under HEENT exam.  Motor exam: Normal bulk, global strength of about 4+ out of 5, no resting tremor, no obvious action tremor, tone is normal.  Fine motor skills and coordination: Globally mildly impaired.   Cerebellar testing: No dysmetria or intention. There is no truncal or gait ataxia.  Sensory exam: intact to light touch in the upper and lower extremities.  Gait, station and balance: He stands slowly and pushes himself up.  Posture is mildly stooped, walks with a rolling walker.     Assessment and Plan:  In summary, Armistead Sult is a 77 year old male with an underlying complex medical history of lung cancer (SCLC with s/p chemo and radiation), anemia, arthritis, asthma, atrial flutter, BPH, congestive heart failure, COPD, depression, anxiety, kidney stone,  hypertension, hyperlipidemia, hypothyroidism, macular degeneration, peripheral vascular disease, history of prostatitis, diabetes with neuropathy, and borderline overweight state, who, he is advised that we can seek a mask refit appointment through his DME provider.  I will update his prescription for supplies as well.  He is advised to try to make enough time for sleep, try to achieve 6+ hours of sleep if possible, 7 to 8 hours are generally recommended.  We talked about the importance of maintaining a healthy lifestyle and limiting caffeine and alcohol.  We talked about causes of daytime somnolence and tiredness.  He currently does not drive.  He is on potentially sedating medications including gabapentin and zolpidem.  Chronic heart disease and chronic illnesses including lung disease can be contributors to fatigue.  He is advised to follow-up routinely in sleep clinic in 1 year.  He is not quite eligible for new machine since his set up date was December 2019.  His apnea control is good on the current settings.  I answered all their questions today and the patient and his GF were in agreement. I would like to see him back after the sleep study is completed and encouraged him to call with any interim questions, concerns, problems or updates.    Thank you very much for allowing me to participate in the care of this nice patient. If I can be of any further assistance to you please do not hesitate to call me at [phone].   Sincerely,     John Foley, MD, PhD

## 2023-01-09 DIAGNOSIS — I152 Hypertension secondary to endocrine disorders: Secondary | ICD-10-CM | POA: Diagnosis not present

## 2023-01-09 DIAGNOSIS — I5033 Acute on chronic diastolic (congestive) heart failure: Secondary | ICD-10-CM | POA: Diagnosis not present

## 2023-01-09 DIAGNOSIS — E1169 Type 2 diabetes mellitus with other specified complication: Secondary | ICD-10-CM | POA: Diagnosis not present

## 2023-01-09 DIAGNOSIS — E1142 Type 2 diabetes mellitus with diabetic polyneuropathy: Secondary | ICD-10-CM | POA: Diagnosis not present

## 2023-01-09 DIAGNOSIS — E1159 Type 2 diabetes mellitus with other circulatory complications: Secondary | ICD-10-CM | POA: Diagnosis not present

## 2023-01-09 DIAGNOSIS — E1151 Type 2 diabetes mellitus with diabetic peripheral angiopathy without gangrene: Secondary | ICD-10-CM | POA: Diagnosis not present

## 2023-01-11 DIAGNOSIS — E1159 Type 2 diabetes mellitus with other circulatory complications: Secondary | ICD-10-CM | POA: Diagnosis not present

## 2023-01-11 DIAGNOSIS — Z794 Long term (current) use of insulin: Secondary | ICD-10-CM | POA: Diagnosis not present

## 2023-01-11 DIAGNOSIS — I5033 Acute on chronic diastolic (congestive) heart failure: Secondary | ICD-10-CM | POA: Diagnosis not present

## 2023-01-11 DIAGNOSIS — N401 Enlarged prostate with lower urinary tract symptoms: Secondary | ICD-10-CM | POA: Diagnosis not present

## 2023-01-11 DIAGNOSIS — K219 Gastro-esophageal reflux disease without esophagitis: Secondary | ICD-10-CM | POA: Diagnosis not present

## 2023-01-11 DIAGNOSIS — F5101 Primary insomnia: Secondary | ICD-10-CM | POA: Diagnosis not present

## 2023-01-11 DIAGNOSIS — I480 Paroxysmal atrial fibrillation: Secondary | ICD-10-CM | POA: Diagnosis not present

## 2023-01-11 DIAGNOSIS — J449 Chronic obstructive pulmonary disease, unspecified: Secondary | ICD-10-CM | POA: Diagnosis not present

## 2023-01-11 DIAGNOSIS — Z556 Problems related to health literacy: Secondary | ICD-10-CM | POA: Diagnosis not present

## 2023-01-11 DIAGNOSIS — E039 Hypothyroidism, unspecified: Secondary | ICD-10-CM | POA: Diagnosis not present

## 2023-01-11 DIAGNOSIS — E1151 Type 2 diabetes mellitus with diabetic peripheral angiopathy without gangrene: Secondary | ICD-10-CM | POA: Diagnosis not present

## 2023-01-11 DIAGNOSIS — Z9981 Dependence on supplemental oxygen: Secondary | ICD-10-CM | POA: Diagnosis not present

## 2023-01-11 DIAGNOSIS — I152 Hypertension secondary to endocrine disorders: Secondary | ICD-10-CM | POA: Diagnosis not present

## 2023-01-11 DIAGNOSIS — J9611 Chronic respiratory failure with hypoxia: Secondary | ICD-10-CM | POA: Diagnosis not present

## 2023-01-11 DIAGNOSIS — D500 Iron deficiency anemia secondary to blood loss (chronic): Secondary | ICD-10-CM | POA: Diagnosis not present

## 2023-01-11 DIAGNOSIS — N138 Other obstructive and reflux uropathy: Secondary | ICD-10-CM | POA: Diagnosis not present

## 2023-01-11 DIAGNOSIS — E1169 Type 2 diabetes mellitus with other specified complication: Secondary | ICD-10-CM | POA: Diagnosis not present

## 2023-01-11 DIAGNOSIS — E782 Mixed hyperlipidemia: Secondary | ICD-10-CM | POA: Diagnosis not present

## 2023-01-11 DIAGNOSIS — I6521 Occlusion and stenosis of right carotid artery: Secondary | ICD-10-CM | POA: Diagnosis not present

## 2023-01-11 DIAGNOSIS — Z87891 Personal history of nicotine dependence: Secondary | ICD-10-CM | POA: Diagnosis not present

## 2023-01-11 DIAGNOSIS — C3491 Malignant neoplasm of unspecified part of right bronchus or lung: Secondary | ICD-10-CM | POA: Diagnosis not present

## 2023-01-11 DIAGNOSIS — Z9181 History of falling: Secondary | ICD-10-CM | POA: Diagnosis not present

## 2023-01-11 DIAGNOSIS — R338 Other retention of urine: Secondary | ICD-10-CM | POA: Diagnosis not present

## 2023-01-11 DIAGNOSIS — E1142 Type 2 diabetes mellitus with diabetic polyneuropathy: Secondary | ICD-10-CM | POA: Diagnosis not present

## 2023-01-11 DIAGNOSIS — I4892 Unspecified atrial flutter: Secondary | ICD-10-CM | POA: Diagnosis not present

## 2023-01-14 ENCOUNTER — Other Ambulatory Visit: Payer: Self-pay | Admitting: Family

## 2023-01-14 DIAGNOSIS — K219 Gastro-esophageal reflux disease without esophagitis: Secondary | ICD-10-CM

## 2023-01-16 DIAGNOSIS — E1151 Type 2 diabetes mellitus with diabetic peripheral angiopathy without gangrene: Secondary | ICD-10-CM | POA: Diagnosis not present

## 2023-01-16 DIAGNOSIS — E1142 Type 2 diabetes mellitus with diabetic polyneuropathy: Secondary | ICD-10-CM | POA: Diagnosis not present

## 2023-01-16 DIAGNOSIS — I152 Hypertension secondary to endocrine disorders: Secondary | ICD-10-CM | POA: Diagnosis not present

## 2023-01-16 DIAGNOSIS — E1169 Type 2 diabetes mellitus with other specified complication: Secondary | ICD-10-CM | POA: Diagnosis not present

## 2023-01-16 DIAGNOSIS — E1159 Type 2 diabetes mellitus with other circulatory complications: Secondary | ICD-10-CM | POA: Diagnosis not present

## 2023-01-16 DIAGNOSIS — I5033 Acute on chronic diastolic (congestive) heart failure: Secondary | ICD-10-CM | POA: Diagnosis not present

## 2023-01-17 ENCOUNTER — Other Ambulatory Visit: Payer: Self-pay | Admitting: Family

## 2023-01-17 DIAGNOSIS — J449 Chronic obstructive pulmonary disease, unspecified: Secondary | ICD-10-CM

## 2023-01-19 NOTE — Progress Notes (Unsigned)
Patient name: John [name] MRN: [id number] DOB: [birth year] Sex: male  REASON FOR VISIT: 6 month follow-up right leg PAD  HPI: John [name] is a 77 y.o. male with multiple medical comorbidities including hypertension, hyperlipidemia, diabetes, COPD, CHF, tobacco abuse that presents for 6 month follow-up of right leg PAD.  States that he has had cramping in his right leg when walking particularly in the calf.  Also has arthritis in his hips and now using a walker at times.  Still smoking about half a pack a day.   Initially underwent right common iliac stent with a right femoral endarterectomy on 08/20/2020 for short distance lifestyle limiting claudication.  He then developed recurrent symptoms in the right leg.  On 05/30/2021 he underwent right proximal common iliac artery angioplasty with drug-coated balloon, multiple right SFA and above-knee popliteal angioplasty with drug-coated balloon.    Past Medical History:  Diagnosis Date   Adenocarcinoma of lung, right (HCC) 04/18/2016   Anemia    Anxiety    Arthritis    Asthma    Atrial flutter (HCC)    BPH (benign prostatic hyperplasia)    with urinary retention 02/06/20   CHF (congestive heart failure) (HCC)    COPD (chronic obstructive pulmonary disease) (HCC)    Depression    Diabetes mellitus, type II (HCC)    Dyspnea    History of kidney stones    History of radiation therapy    right lung 09/10/2020-09/17/2020   Dr Roselind Messier   History of radiation therapy    Left and right Lung- 06/19/22-06/26/22   Hyperlipidemia    Hypertension    Hypothyroidism    Macular degeneration    Neuropathy    Non-small cell lung cancer, right (HCC) 04/18/2016   Peripheral vascular disease (HCC)    Prostatitis    Pulmonary nodule, left 07/16/2016   Sleep apnea    cpap    Past Surgical History:  Procedure Laterality Date   A-FLUTTER ABLATION N/A 01/13/2022   Procedure: A-FLUTTER ABLATION;  Surgeon: Lanier Prude, MD;  Location: [facility];  Service: Cardiovascular;  Laterality: N/A;   ABDOMINAL AORTOGRAM W/LOWER EXTREMITY Left 02/06/2020   Procedure: ABDOMINAL AORTOGRAM W/LOWER EXTREMITY;  Surgeon: Runell Gess, MD;  Location: [facility];  Service: Cardiovascular;  Laterality: Left;   ABDOMINAL AORTOGRAM W/LOWER EXTREMITY N/A 05/30/2021   Procedure: ABDOMINAL AORTOGRAM W/LOWER EXTREMITY;  Surgeon: Cephus Shelling, MD;  Location: [facility];  Service: Cardiovascular;  Laterality: N/A;   BIOPSY  09/16/2021   Procedure: BIOPSY;  Surgeon: Iva Boop, MD;  Location: [facility] ENDOSCOPY;  Service: Gastroenterology;;   CARDIOVERSION N/A 10/05/2020   Procedure: CARDIOVERSION;  Surgeon: Sande Rives, MD;  Location: [facility] ENDOSCOPY;  Service: Cardiovascular;  Laterality: N/A;   CATARACT EXTRACTION, BILATERAL Bilateral    COLONOSCOPY WITH PROPOFOL N/A 06/20/2021   Procedure: COLONOSCOPY WITH PROPOFOL;  Surgeon: Hilarie Fredrickson, MD;  Location: [facility] ENDOSCOPY;  Service: Endoscopy;  Laterality: N/A;   COLONOSCOPY WITH PROPOFOL N/A 09/16/2021   Procedure: COLONOSCOPY WITH PROPOFOL;  Surgeon: Iva Boop, MD;  Location: [facility] ENDOSCOPY;  Service: Gastroenterology;  Laterality: N/A;   ENDARTERECTOMY FEMORAL Right 08/20/2020   Procedure: ENDARTERECTOMY  RIGHT FEMORAL ARTERY;  Surgeon: Cephus Shelling, MD;  Location: [facility] OR;  Service: Vascular;  Laterality: Right;   ENTEROSCOPY N/A 10/11/2021   Procedure: ENTEROSCOPY;  Surgeon: Dolores Frame, MD;  Location: [facility] ENDO SUITE;  Service: Gastroenterology;  Laterality: N/A;  ESOPHAGOGASTRODUODENOSCOPY N/A 09/16/2021   Procedure: ESOPHAGOGASTRODUODENOSCOPY (EGD);  Surgeon: Iva Boop, MD;  Location: [facility] ENDOSCOPY;  Service: Gastroenterology;  Laterality: N/A;   ESOPHAGOGASTRODUODENOSCOPY (EGD) WITH PROPOFOL N/A 09/06/2020   Procedure: ESOPHAGOGASTRODUODENOSCOPY (EGD) WITH PROPOFOL;  Surgeon: Hilarie Fredrickson, MD;  Location: [facility] ENDOSCOPY;  Service: Endoscopy;   Laterality: N/A;   ESOPHAGOGASTRODUODENOSCOPY (EGD) WITH PROPOFOL N/A 10/11/2021   Procedure: ESOPHAGOGASTRODUODENOSCOPY (EGD) WITH PROPOFOL;  Surgeon: Dolores Frame, MD;  Location: [facility] ENDO SUITE;  Service: Gastroenterology;  Laterality: N/A;   HEMOSTASIS CLIP PLACEMENT  09/16/2021   Procedure: HEMOSTASIS CLIP PLACEMENT;  Surgeon: Iva Boop, MD;  Location: [facility] ENDOSCOPY;  Service: Gastroenterology;;   HOT HEMOSTASIS N/A 09/16/2021   Procedure: HOT HEMOSTASIS (ARGON PLASMA COAGULATION/BICAP);  Surgeon: Iva Boop, MD;  Location: [facility] ENDOSCOPY;  Service: Gastroenterology;  Laterality: N/A;   HOT HEMOSTASIS  10/11/2021   Procedure: HOT HEMOSTASIS (ARGON PLASMA COAGULATION/BICAP);  Surgeon: Marguerita Merles, Reuel Boom, MD;  Location: [facility] ENDO SUITE;  Service: Gastroenterology;;   INSERTION OF ILIAC STENT Right 08/20/2020   Procedure: RETROGRADE INSERTION OF RIGHT ILIAC STENT;  Surgeon: Cephus Shelling, MD;  Location: [facility] OR;  Service: Vascular;  Laterality: Right;   INTRAOPERATIVE ARTERIOGRAM Right 08/20/2020   Procedure: INTRA OPERATIVE ARTERIOGRAM ILIAC;  Surgeon: Cephus Shelling, MD;  Location: [facility] OR;  Service: Vascular;  Laterality: Right;   PATCH ANGIOPLASTY Right 08/20/2020   Procedure: PATCH ANGIOPLASTY RIGHT FEMORAL ARTERY;  Surgeon: Cephus Shelling, MD;  Location: [facility] OR;  Service: Vascular;  Laterality: Right;   PERIPHERAL VASCULAR BALLOON ANGIOPLASTY Right 05/30/2021   Procedure: PERIPHERAL VASCULAR BALLOON ANGIOPLASTY;  Surgeon: Cephus Shelling, MD;  Location: [facility];  Service: Cardiovascular;  Laterality: Right;   PORTACATH PLACEMENT Left 06/13/2016   Procedure: INSERTION PORT-A-CATH;  Surgeon: Franky Macho, MD;  Location: [facility] ORS;  Service: General;  Laterality: Left;   TRANSURETHRAL RESECTION OF PROSTATE N/A 05/31/2020   Procedure: TRANSURETHRAL RESECTION OF THE PROSTATE (TURP);  Surgeon: Malen Gauze, MD;  Location: [facility] ORS;  Service: Urology;   Laterality: N/A;   VIDEO BRONCHOSCOPY WITH ENDOBRONCHIAL NAVIGATION N/A 05/28/2016   Procedure: VIDEO BRONCHOSCOPY WITH ENDOBRONCHIAL NAVIGATION;  Surgeon: Loreli Slot, MD;  Location: [facility] OR;  Service: Thoracic;  Laterality: N/A;   VIDEO BRONCHOSCOPY WITH ENDOBRONCHIAL ULTRASOUND N/A 05/28/2016   Procedure: VIDEO BRONCHOSCOPY WITH ENDOBRONCHIAL ULTRASOUND;  Surgeon: Loreli Slot, MD;  Location: [facility] OR;  Service: Thoracic;  Laterality: N/A;    Family History  Problem Relation Age of Onset   Hypertension Mother    Diabetes Father    Heart disease Father    Stroke Father    Hypertension Sister    Sleep apnea Neg Hx     SOCIAL HISTORY: Social History   Tobacco Use   Smoking status: Every Day    Current packs/day: 0.50    Average packs/day: 0.5 packs/day for 61.9 years (30.9 ttl pk-yrs)    Types: Cigarettes    Start date: 03/11/1961   Smokeless tobacco: Never   Tobacco comments:    1/2 pack to 1 pack per day 12/14/19  Substance Use Topics   Alcohol use: No    Allergies  Allergen Reactions   Sulfa Antibiotics Swelling    Mouth swelling   Atorvastatin Other (See Comments)    Muscle aches - tolerating Pravastatin 40 mg MWF   Jardiance [Empagliflozin] Other (See Comments)    FEELS SLUGGISH, TIRED   Lopressor [Metoprolol] Other (See Comments)  Fatigue   Rosuvastatin Other (See Comments)    Muscle aches - tolerating Pravastatin 40 mg MWF   Doxycycline Nausea Only   Tramadol Itching   Temazepam Other (See Comments)    Made insomnia worse     Current Outpatient Medications  Medication Sig Dispense Refill   albuterol (VENTOLIN HFA) 108 (90 Base) MCG/ACT inhaler INHALE 2 PUFFS INTO THE LUNGS EVERY 6 HOURS AS NEEDED FOR WHEEZE OR SHORTNESS OF BREATH 8.5 each 5   allopurinol (ZYLOPRIM) 300 MG tablet Take 1 tablet (300 mg total) by mouth daily. 90 tablet 1   aspirin EC 81 MG tablet Take 1 tablet (81 mg total) by mouth daily. Swallow whole. 90 tablet 3   Blood Glucose  Monitoring Suppl (ONETOUCH VERIO REFLECT) w/Device KIT Use to test blood sugars daily as directed. DX: E11.9 1 kit 1   celecoxib (CELEBREX) 200 MG capsule Take 1 capsule (200 mg total) by mouth daily. 90 capsule 0   cetirizine (ZYRTEC ALLERGY) 10 MG tablet Take 1 tablet (10 mg total) by mouth daily. 90 tablet 1   Continuous Glucose Receiver (FREESTYLE LIBRE 3 READER) DEVI 1 Device by Does not apply route every 14 (fourteen) days. 1 each 3   Continuous Glucose Sensor (FREESTYLE LIBRE 3 SENSOR) MISC 1 Device by Does not apply route continuous. 1 each 2   dutasteride (AVODART) 0.5 MG capsule Take 1 capsule (0.5 mg total) by mouth daily. 90 capsule 3   ezetimibe (ZETIA) 10 MG tablet TAKE 1 TABLET BY MOUTH EVERY DAY 90 tablet 3   fluticasone (FLONASE) 50 MCG/ACT nasal spray PLACE 1 SPRAY INTO BOTH NOSTRILS DAILY AS NEEDED FOR ALLERGIES OR RHINITIS. 48 mL 1   Fluticasone-Umeclidin-Vilant (TRELEGY ELLIPTA) 100-62.5-25 MCG/ACT AEPB Inhale 1 puff into the lungs daily. 1 each 11   gabapentin (NEURONTIN) 300 MG capsule TAKE 1 CAPSULE BY MOUTH THREE TIMES A DAY 270 capsule 0   insulin degludec (TRESIBA FLEXTOUCH) 200 UNIT/ML FlexTouch Pen Inject 16 Units into the skin every evening. 12 mL 3   Insulin Pen Needle (BD PEN NEEDLE NANO U/F) 32G X 4 MM MISC Use to inject insulin once daily 100 each 3   KLOR-CON M20 20 MEQ tablet TAKE 1 TABLET BY MOUTH EVERY DAY 90 tablet 0   levothyroxine (SYNTHROID) 50 MCG tablet TAKE 1 TABLET BY MOUTH EVERY DAY 90 tablet 1   linaclotide (LINZESS) 72 MCG capsule Take 1 capsule (72 mcg total) by mouth daily before breakfast. 90 capsule 1   metoprolol succinate (TOPROL XL) 25 MG 24 hr tablet Take 0.5 tablets (12.5 mg total) by mouth daily. 45 tablet 3   NON FORMULARY CPAP at bedtime     ondansetron (ZOFRAN) 4 MG tablet Take 1 tablet (4 mg total) by mouth every 8 (eight) hours as needed for nausea or vomiting. 90 tablet 1   OneTouch Delica Lancets 30G MISC Use to test blood sugars  daily as directed. DX: E11.9 100 each 1   OXYGEN Inhale 2 L into the lungs daily as needed (with Cpap at night).     pantoprazole (PROTONIX) 40 MG tablet Take 1 tablet (40 mg total) by mouth daily. 30 tablet 3   pravastatin (PRAVACHOL) 40 MG tablet TAKE 1 TABLET (40 MG TOTAL) BY MOUTH ON MONDAYS , WEDNESDAYS, AND FRIDAYS AS DIRECTED 48 tablet 1   spironolactone (ALDACTONE) 25 MG tablet Take 0.5 tablets (12.5 mg total) by mouth daily. 45 tablet 3   torsemide (DEMADEX) 20 MG tablet Take 1 tablet (  20 mg total) by mouth daily. 90 tablet 2   zolpidem (AMBIEN) 5 MG tablet Take 1 tablet (5 mg total) by mouth at bedtime as needed for sleep. 30 tablet 2   No current facility-administered medications for this visit.    REVIEW OF SYSTEMS:  [X]  denotes positive finding, [ ]  denotes negative finding Cardiac  Comments:  Chest pain or chest pressure:    Shortness of breath upon exertion:    Short of breath when lying flat:    Irregular heart rhythm:        Vascular    Pain in calf, thigh, or hip brought on by ambulation: x Right calf  Pain in feet at night that wakes you up from your sleep:     Blood clot in your veins:    Leg swelling:         Pulmonary    Oxygen at home:    Productive cough:     Wheezing:         Neurologic    Sudden weakness in arms or legs:     Sudden numbness in arms or legs:     Sudden onset of difficulty speaking or slurred speech:    Temporary loss of vision in one eye:     Problems with dizziness:         Gastrointestinal    Blood in stool:     Vomited blood:         Genitourinary    Burning when urinating:     Blood in urine:        Psychiatric    Major depression:         Hematologic    Bleeding problems:    Problems with blood clotting too easily:        Skin    Rashes or ulcers:        Constitutional    Fever or chills:      PHYSICAL EXAM: There were no vitals filed for this visit.   GENERAL: The patient is a well-nourished male, in no  acute distress. The vital signs are documented above. CARDIAC: There is a regular rate and rhythm.  VASCULAR:  Previous right groin incision well healed Palpable femoral pulses bilaterally No palpable right pedal pulses but no tissue loss  DATA:   ABIs today 0.64 right and 0.84 left (previously >1)  Aortoiliac duplex shows high grade stenosis right EIA with velocity 404 and also stenosis right CIA with velocity 217   Right lower extremity arterial duplex shows high grade stenosis right SFA stent with velocity 441  Assessment/Plan:  77 year old male status post right common femoral endarterectomy with profundoplasty and bovine pericardial patch angioplasty and right common iliac stent on 08/20/2020 for short distance lifestyle limiting claudication.  He developed recurrent calf claudication and started back smoking.  Ultimately then underwent right common iliac angioplasty with DCB as well as right SFA and above-knee popliteal angioplasty with DCB on 05/30/2021.  On surveillance today he has evidence of a high-grade stenosis in both the right external iliac artery as well as his right SFA.  I have recommended returning to the Cath Lab for aortogram with a right lower extremity arteriogram with possible intervention on his iliac artery including possibly the SFA as well in the right leg.  This could be angioplasty and/or additional stenting.  He has recurrent right leg claudication symptoms and have again enforced the importance of smoking cessation.  I discussed transfemoral access under moderate sedation  in the Cath Lab with risk and benefits.     Cephus Shelling, MD [facility] Office: [phone]

## 2023-01-20 ENCOUNTER — Ambulatory Visit (HOSPITAL_COMMUNITY)
Admission: RE | Admit: 2023-01-20 | Discharge: 2023-01-20 | Disposition: A | Discharge status: Home / Self Care | Payer: Medicare Other | Source: Ambulatory Visit | Attending: Vascular Surgery | Admitting: Vascular Surgery

## 2023-01-20 ENCOUNTER — Ambulatory Visit (INDEPENDENT_AMBULATORY_CARE_PROVIDER_SITE_OTHER)
Admission: RE | Admit: 2023-01-20 | Discharge: 2023-01-20 | Disposition: A | Discharge status: Home / Self Care | Payer: Medicare Other | Source: Ambulatory Visit | Attending: Vascular Surgery | Admitting: Vascular Surgery

## 2023-01-20 ENCOUNTER — Encounter: Payer: Self-pay | Admitting: Vascular Surgery

## 2023-01-20 ENCOUNTER — Ambulatory Visit (INDEPENDENT_AMBULATORY_CARE_PROVIDER_SITE_OTHER): Payer: Medicare Other | Admitting: Vascular Surgery

## 2023-01-20 ENCOUNTER — Other Ambulatory Visit: Payer: Self-pay | Admitting: *Deleted

## 2023-01-20 VITALS — BP 118/68 | HR 110 | Temp 97.9°F | Resp 18 | Ht 66.0 in | Wt 156.2 lb

## 2023-01-20 DIAGNOSIS — I739 Peripheral vascular disease, unspecified: Secondary | ICD-10-CM

## 2023-01-20 LAB — VAS US ABI WITH/WO TBI
Left ABI: 0.84
Right ABI: 0.64

## 2023-01-22 DIAGNOSIS — E1151 Type 2 diabetes mellitus with diabetic peripheral angiopathy without gangrene: Secondary | ICD-10-CM | POA: Diagnosis not present

## 2023-01-22 DIAGNOSIS — I152 Hypertension secondary to endocrine disorders: Secondary | ICD-10-CM | POA: Diagnosis not present

## 2023-01-22 DIAGNOSIS — E1169 Type 2 diabetes mellitus with other specified complication: Secondary | ICD-10-CM | POA: Diagnosis not present

## 2023-01-22 DIAGNOSIS — E1142 Type 2 diabetes mellitus with diabetic polyneuropathy: Secondary | ICD-10-CM | POA: Diagnosis not present

## 2023-01-22 DIAGNOSIS — E1159 Type 2 diabetes mellitus with other circulatory complications: Secondary | ICD-10-CM | POA: Diagnosis not present

## 2023-01-22 DIAGNOSIS — I5033 Acute on chronic diastolic (congestive) heart failure: Secondary | ICD-10-CM | POA: Diagnosis not present

## 2023-01-23 NOTE — Progress Notes (Signed)
[facility] Summary Report / Loop Recorder 

## 2023-01-25 ENCOUNTER — Other Ambulatory Visit: Payer: Self-pay | Admitting: Family

## 2023-01-25 DIAGNOSIS — M160 Bilateral primary osteoarthritis of hip: Secondary | ICD-10-CM

## 2023-01-27 ENCOUNTER — Encounter: Payer: Self-pay | Admitting: Family

## 2023-01-27 ENCOUNTER — Ambulatory Visit (INDEPENDENT_AMBULATORY_CARE_PROVIDER_SITE_OTHER): Payer: Medicare Other | Admitting: Family

## 2023-01-27 VITALS — BP 134/71 | HR 113 | Temp 97.8°F | Ht 66.0 in | Wt 156.0 lb

## 2023-01-27 DIAGNOSIS — E1159 Type 2 diabetes mellitus with other circulatory complications: Secondary | ICD-10-CM

## 2023-01-27 DIAGNOSIS — E1169 Type 2 diabetes mellitus with other specified complication: Secondary | ICD-10-CM | POA: Diagnosis not present

## 2023-01-27 DIAGNOSIS — Z720 Tobacco use: Secondary | ICD-10-CM

## 2023-01-27 DIAGNOSIS — E1142 Type 2 diabetes mellitus with diabetic polyneuropathy: Secondary | ICD-10-CM | POA: Diagnosis not present

## 2023-01-27 DIAGNOSIS — E782 Mixed hyperlipidemia: Secondary | ICD-10-CM

## 2023-01-27 DIAGNOSIS — K5900 Constipation, unspecified: Secondary | ICD-10-CM

## 2023-01-27 DIAGNOSIS — M150 Primary generalized (osteo)arthritis: Secondary | ICD-10-CM

## 2023-01-27 DIAGNOSIS — I152 Hypertension secondary to endocrine disorders: Secondary | ICD-10-CM

## 2023-01-27 DIAGNOSIS — C3491 Malignant neoplasm of unspecified part of right bronchus or lung: Secondary | ICD-10-CM | POA: Diagnosis not present

## 2023-01-27 DIAGNOSIS — E039 Hypothyroidism, unspecified: Secondary | ICD-10-CM | POA: Diagnosis not present

## 2023-01-27 DIAGNOSIS — G4733 Obstructive sleep apnea (adult) (pediatric): Secondary | ICD-10-CM | POA: Diagnosis not present

## 2023-01-27 DIAGNOSIS — F5101 Primary insomnia: Secondary | ICD-10-CM | POA: Diagnosis not present

## 2023-01-27 DIAGNOSIS — I739 Peripheral vascular disease, unspecified: Secondary | ICD-10-CM | POA: Diagnosis not present

## 2023-01-27 DIAGNOSIS — K219 Gastro-esophageal reflux disease without esophagitis: Secondary | ICD-10-CM | POA: Diagnosis not present

## 2023-01-27 DIAGNOSIS — I4892 Unspecified atrial flutter: Secondary | ICD-10-CM

## 2023-01-27 LAB — BAYER DCA HB A1C WAIVED: HB A1C (BAYER DCA - WAIVED): 7.8 % — ABNORMAL HIGH (ref 4.8–5.6)

## 2023-01-27 MED ORDER — CELECOXIB 200 MG PO CAPS: 200.0000 mg | ORAL_CAPSULE | Freq: Every day | ORAL | 0 refills | Status: DC

## 2023-01-27 NOTE — Progress Notes (Signed)
Subjective:    Patient ID: John [name], male    DOB: Feb 03, 1946, 77 y.o.   MRN: [id number]  Chief Complaint  Patient presents with   Follow-up   GI Problem   Pt presents to the office today for  chronic care follow up. He is followed by Cardiologists  for CHF.    Followed by Oncologists every 3 months for non-small lung cancer. He completed radiation.     He is followed by Pulmonologist every 6 months for COPD. He continues to smoke 1/2 and complaining of increased SOB.  He is followed by Vascular for PAD.    He is followed by Urologists for BPH and Prostate.   He has seen GI for bleed, but stable now.     Has OSA and using CPAP nightly.    GI Problem The primary symptoms include fatigue and diarrhea (when taking linzess). Primary symptoms do not include weight loss.  The illness is also significant for constipation. Significant associated medical issues include GERD.  Hypertension This is a chronic problem. The current episode started more than 1 year ago. The problem has been resolved since onset. The problem is controlled. Associated symptoms include malaise/fatigue and shortness of breath. Pertinent negatives include no peripheral edema. Risk factors for coronary artery disease include dyslipidemia, obesity, male gender and sedentary lifestyle. The current treatment provides moderate improvement. Identifiable causes of hypertension include a thyroid problem.  Congestive Heart Failure Presents for follow-up visit. Associated symptoms include fatigue, nocturia and shortness of breath. Pertinent negatives include no edema. The symptoms have been stable.  Gastroesophageal Reflux He complains of belching and heartburn. He reports no globus sensation. This is a chronic problem. The current episode started more than 1 year ago. The problem occurs rarely. Associated symptoms include fatigue. Pertinent negatives include no weight loss. He has tried a PPI for the symptoms. The  treatment provided moderate relief.  Hyperlipidemia This is a chronic problem. The current episode started more than 1 year ago. Exacerbating diseases include obesity. Associated symptoms include shortness of breath. Current antihyperlipidemic treatment includes statins and diet change. The current treatment provides moderate improvement of lipids. Risk factors for coronary artery disease include dyslipidemia, hypertension and a sedentary lifestyle.  Thyroid Problem Symptoms include constipation, diarrhea (when taking linzess) and fatigue. Patient reports no weight loss. His past medical history is significant for hyperlipidemia.  Arthritis Presents for follow-up visit. He complains of pain and stiffness. Affected locations include the left hip, left knee, right knee, right hip, left MCP and right MCP. His pain is at a severity of 3/10. Associated symptoms include diarrhea (when taking linzess) and fatigue. Pertinent negatives include no weight loss.  Benign Prostatic Hypertrophy This is a chronic problem. The current episode started more than 1 year ago. Irritative symptoms include nocturia. Irritative symptoms do not include frequency.  Insomnia Primary symptoms: sleep disturbance, difficulty falling asleep, frequent awakening, malaise/fatigue.   The current episode started more than one year. The problem occurs intermittently. Past treatments include medication. The treatment provided moderate relief.  Constipation This is a chronic problem. The current episode started more than 1 year ago. His stool frequency is 1 time per day (with linzess). Associated symptoms include diarrhea (when taking linzess). Pertinent negatives include no weight loss. He has tried laxatives for the symptoms. The treatment provided moderate relief.  Diabetes He presents for his follow-up diabetic visit. He has type 2 diabetes mellitus. Associated symptoms include fatigue and foot paresthesias. Pertinent negatives for  diabetes  include no weight loss. Diabetic complications include peripheral neuropathy. Risk factors for coronary artery disease include diabetes mellitus, dyslipidemia, male sex, hypertension and sedentary lifestyle. He is following a generally healthy diet. His overall blood glucose range is 140-180 mg/dl.      Review of Systems  Constitutional:  Positive for fatigue and malaise/fatigue. Negative for weight loss.  Respiratory:  Positive for shortness of breath.   Gastrointestinal:  Positive for constipation, diarrhea (when taking linzess) and heartburn.  Genitourinary:  Positive for nocturia. Negative for frequency.  Musculoskeletal:  Positive for arthritis and stiffness.  Psychiatric/Behavioral:  Positive for sleep disturbance. The patient has insomnia.   All other systems reviewed and are negative.      Objective:   Physical Exam Vitals reviewed.  Constitutional:      General: He is not in acute distress.    Appearance: He is well-developed. He is obese.  HENT:     Head: Normocephalic.     Right Ear: Tympanic membrane normal.     Left Ear: Tympanic membrane normal.  Eyes:     General:        Right eye: No discharge.        Left eye: No discharge.     Pupils: Pupils are equal, round, and reactive to light.  Neck:     Thyroid: No thyromegaly.  Cardiovascular:     Rate and Rhythm: Normal rate and regular rhythm.     Heart sounds: Murmur heard.  Pulmonary:     Effort: Pulmonary effort is normal. No respiratory distress.     Breath sounds: Normal breath sounds. No wheezing.  Abdominal:     General: Bowel sounds are normal. There is no distension.     Palpations: Abdomen is soft.     Tenderness: There is no abdominal tenderness.  Musculoskeletal:        General: No tenderness. Normal range of motion.     Cervical back: Normal range of motion and neck supple.  Skin:    General: Skin is warm and dry.     Findings: No erythema or rash.  Neurological:     Mental Status: He  is alert and oriented to person, place, and time.     Cranial Nerves: No cranial nerve deficit.     Motor: Weakness present.     Gait: Gait abnormal (using rolling walker).     Deep Tendon Reflexes: Reflexes are normal and symmetric.  Psychiatric:        Behavior: Behavior normal.        Thought Content: Thought content normal.        Judgment: Judgment normal.       BP 134/71   Pulse (!) 113   Temp 97.8 F (36.6 C) (Temporal)   Ht 5\' 6"  (1.676 m)   Wt 156 lb (70.8 kg)   SpO2 94%   BMI 25.18 kg/m      Assessment & Plan:  GREYDON BAQUET comes in today with chief complaint of Follow-up and GI Problem   Diagnosis and orders addressed:  1. Primary osteoarthritis involving multiple joints - CBC with Differential/Platelet - CMP14+EGFR  2. Primary insomnia  - CBC with Differential/Platelet - CMP14+EGFR  3. Tobacco abuse - CBC with Differential/Platelet - CMP14+EGFR  4. Paroxysmal atrial flutter (HCC) - CBC with Differential/Platelet - CMP14+EGFR  5. PAD (peripheral artery disease) (HCC) - CBC with Differential/Platelet - CMP14+EGFR  6. Obstructive sleep apnea treated with continuous positive airway pressure (CPAP) - CBC with Differential/Platelet -  CMP14+EGFR  7. Non-small cell lung cancer, right (HCC) - CBC with Differential/Platelet - CMP14+EGFR  8. Mixed diabetic hyperlipidemia associated with type 2 diabetes mellitus (HCC) - CBC with Differential/Platelet - CMP14+EGFR  9. Hypothyroidism, unspecified type - CBC with Differential/Platelet - CMP14+EGFR - TSH  10. Hypertension associated with diabetes (HCC) - CBC with Differential/Platelet - CMP14+EGFR  11. GERD without esophagitis - CBC with Differential/Platelet - CMP14+EGFR  12. Type 2 diabetes mellitus with diabetic polyneuropathy, without long-term current use of insulin (HCC) - CBC with Differential/Platelet - CMP14+EGFR - Bayer DCA Hb A1c Waived  13. Constipation, unspecified  constipation type - CBC with Differential/Platelet - CMP14+EGFR   Labs pending Continue medications  Keep follow up with specialists  Health Maintenance reviewed Diet and exercise encouraged  Follow up plan: 3 months    Jannifer Rodney, FNP

## 2023-01-27 NOTE — Patient Instructions (Signed)
Health Maintenance After Age 77 After age 77, you are at a higher risk for certain long-term diseases and infections as well as injuries from falls. Falls are a major cause of broken bones and head injuries in people who are older than age 77. Getting regular preventive care can help to keep you healthy and well. Preventive care includes getting regular testing and making lifestyle changes as recommended by your health care provider. Talk with your health care provider about: Which screenings and tests you should have. A screening is a test that checks for a disease when you have no symptoms. A diet and exercise plan that is right for you. What should I know about screenings and tests to prevent falls? Screening and testing are the best ways to find a health problem early. Early diagnosis and treatment give you the best chance of managing medical conditions that are common after age 77. Certain conditions and lifestyle choices may make you more likely to have a fall. Your health care provider may recommend: Regular vision checks. Poor vision and conditions such as cataracts can make you more likely to have a fall. If you wear glasses, make sure to get your prescription updated if your vision changes. Medicine review. Work with your health care provider to regularly review all of the medicines you are taking, including over-the-counter medicines. Ask your health care provider about any side effects that may make you more likely to have a fall. Tell your health care provider if any medicines that you take make you feel dizzy or sleepy. Strength and balance checks. Your health care provider may recommend certain tests to check your strength and balance while standing, walking, or changing positions. Foot health exam. Foot pain and numbness, as well as not wearing proper footwear, can make you more likely to have a fall. Screenings, including: Osteoporosis screening. Osteoporosis is a condition that causes  the bones to get weaker and break more easily. Blood pressure screening. Blood pressure changes and medicines to control blood pressure can make you feel dizzy. Depression screening. You may be more likely to have a fall if you have a fear of falling, feel depressed, or feel unable to do activities that you used to do. Alcohol use screening. Using too much alcohol can affect your balance and may make you more likely to have a fall. Follow these instructions at home: Lifestyle Do not drink alcohol if: Your health care provider tells you not to drink. If you drink alcohol: Limit how much you have to: 0-1 drink a day for women. 0-2 drinks a day for men. Know how much alcohol is in your drink. In the [place] one drink equals one 12 oz bottle of beer (355 mL), one 5 oz glass of wine (148 mL), or one 1 oz glass of hard liquor (44 mL). Do not use any products that contain nicotine or tobacco. These products include cigarettes, chewing tobacco, and vaping devices, such as e-cigarettes. If you need help quitting, ask your health care provider. Activity  Follow a regular exercise program to stay fit. This will help you maintain your balance. Ask your health care provider what types of exercise are appropriate for you. If you need a cane or walker, use it as recommended by your health care provider. Wear supportive shoes that have nonskid soles. Safety  Remove any tripping hazards, such as rugs, cords, and clutter. Install safety equipment such as grab bars in bathrooms and safety rails on stairs. Keep rooms and walkways   well-lit. General instructions Talk with your health care provider about your risks for falling. Tell your health care provider if: You fall. Be sure to tell your health care provider about all falls, even ones that seem minor. You feel dizzy, tiredness (fatigue), or off-balance. Take over-the-counter and prescription medicines only as told by your health care provider. These include  supplements. Eat a healthy diet and maintain a healthy weight. A healthy diet includes low-fat dairy products, low-fat (lean) meats, and fiber from whole grains, beans, and lots of fruits and vegetables. Stay current with your vaccines. Schedule regular health, dental, and eye exams. Summary Having a healthy lifestyle and getting preventive care can help to protect your health and wellness after age 77. Screening and testing are the best way to find a health problem early and help you avoid having a fall. Early diagnosis and treatment give you the best chance for managing medical conditions that are more common for people who are older than age 77. Falls are a major cause of broken bones and head injuries in people who are older than age 77. Take precautions to prevent a fall at home. Work with your health care provider to learn what changes you can make to improve your health and wellness and to prevent falls. This information is not intended to replace advice given to you by your health care provider. Make sure you discuss any questions you have with your health care provider. Document Revised: 08/13/2020 Document Reviewed: 08/13/2020 Elsevier Patient Education  2024 Elsevier Inc.  

## 2023-01-28 LAB — CBC WITH DIFFERENTIAL/PLATELET
Basophils Absolute: 0.1 10*3/uL (ref 0.0–0.2)
Basos: 1 %
EOS (ABSOLUTE): 0.3 10*3/uL (ref 0.0–0.4)
Eos: 3 %
Hematocrit: 36.1 % — ABNORMAL LOW (ref 37.5–51.0)
Hemoglobin: 10.9 g/dL — ABNORMAL LOW (ref 13.0–17.7)
Immature Grans (Abs): 0 10*3/uL (ref 0.0–0.1)
Immature Granulocytes: 0 %
Lymphocytes Absolute: 0.5 10*3/uL — ABNORMAL LOW (ref 0.7–3.1)
Lymphs: 4 %
MCH: 27.7 pg (ref 26.6–33.0)
MCHC: 30.2 g/dL — ABNORMAL LOW (ref 31.5–35.7)
MCV: 92 fL (ref 79–97)
Monocytes Absolute: 0.9 10*3/uL (ref 0.1–0.9)
Monocytes: 7 %
Neutrophils Absolute: 10.8 10*3/uL — ABNORMAL HIGH (ref 1.4–7.0)
Neutrophils: 85 %
Platelets: 447 10*3/uL (ref 150–450)
RBC: 3.93 x10E6/uL — ABNORMAL LOW (ref 4.14–5.80)
RDW: 14.1 % (ref 11.6–15.4)
WBC: 12.7 10*3/uL — ABNORMAL HIGH (ref 3.4–10.8)

## 2023-01-28 LAB — CMP14+EGFR
ALT: 10 [IU]/L (ref 0–44)
AST: 16 [IU]/L (ref 0–40)
Albumin: 3.8 g/dL (ref 3.8–4.8)
Alkaline Phosphatase: 125 [IU]/L — ABNORMAL HIGH (ref 44–121)
BUN/Creatinine Ratio: 14 (ref 10–24)
BUN: 14 mg/dL (ref 8–27)
Bilirubin Total: 0.3 mg/dL (ref 0.0–1.2)
CO2: 22 mmol/L (ref 20–29)
Calcium: 9.2 mg/dL (ref 8.6–10.2)
Chloride: 96 mmol/L (ref 96–106)
Creatinine, Ser: 1.01 mg/dL (ref 0.76–1.27)
Globulin, Total: 3.6 g/dL (ref 1.5–4.5)
Glucose: 232 mg/dL — ABNORMAL HIGH (ref 70–99)
Potassium: 4.3 mmol/L (ref 3.5–5.2)
Sodium: 137 mmol/L (ref 134–144)
Total Protein: 7.4 g/dL (ref 6.0–8.5)
eGFR: 77 mL/min/{1.73_m2} (ref >59)

## 2023-01-28 LAB — TSH: TSH: 1.78 u[IU]/mL (ref 0.450–4.500)

## 2023-01-29 DIAGNOSIS — E1151 Type 2 diabetes mellitus with diabetic peripheral angiopathy without gangrene: Secondary | ICD-10-CM | POA: Diagnosis not present

## 2023-01-29 DIAGNOSIS — E1142 Type 2 diabetes mellitus with diabetic polyneuropathy: Secondary | ICD-10-CM | POA: Diagnosis not present

## 2023-01-29 DIAGNOSIS — I5033 Acute on chronic diastolic (congestive) heart failure: Secondary | ICD-10-CM | POA: Diagnosis not present

## 2023-01-29 DIAGNOSIS — E1159 Type 2 diabetes mellitus with other circulatory complications: Secondary | ICD-10-CM | POA: Diagnosis not present

## 2023-01-29 DIAGNOSIS — E1169 Type 2 diabetes mellitus with other specified complication: Secondary | ICD-10-CM | POA: Diagnosis not present

## 2023-01-29 DIAGNOSIS — I152 Hypertension secondary to endocrine disorders: Secondary | ICD-10-CM | POA: Diagnosis not present

## 2023-01-30 NOTE — Progress Notes (Signed)
Patient r/c  

## 2023-02-05 ENCOUNTER — Encounter (HOSPITAL_COMMUNITY)
Admission: RE | Disposition: A | Discharge status: Home / Self Care | Payer: Self-pay | Source: Home / Self Care | Attending: Vascular Surgery

## 2023-02-05 ENCOUNTER — Other Ambulatory Visit: Payer: Self-pay

## 2023-02-05 ENCOUNTER — Encounter (HOSPITAL_COMMUNITY): Payer: Self-pay | Admitting: Vascular Surgery

## 2023-02-05 ENCOUNTER — Ambulatory Visit (HOSPITAL_COMMUNITY)
Admission: RE | Admit: 2023-02-05 | Discharge: 2023-02-05 | Disposition: A | Discharge status: Home / Self Care | Payer: Medicare Other | Attending: Vascular Surgery | Admitting: Vascular Surgery

## 2023-02-05 DIAGNOSIS — I70211 Atherosclerosis of native arteries of extremities with intermittent claudication, right leg: Secondary | ICD-10-CM | POA: Insufficient documentation

## 2023-02-05 DIAGNOSIS — Z8249 Family history of ischemic heart disease and other diseases of the circulatory system: Secondary | ICD-10-CM | POA: Insufficient documentation

## 2023-02-05 DIAGNOSIS — Z9981 Dependence on supplemental oxygen: Secondary | ICD-10-CM | POA: Insufficient documentation

## 2023-02-05 DIAGNOSIS — J4489 Other specified chronic obstructive pulmonary disease: Secondary | ICD-10-CM | POA: Insufficient documentation

## 2023-02-05 DIAGNOSIS — I110 Hypertensive heart disease with heart failure: Secondary | ICD-10-CM | POA: Diagnosis not present

## 2023-02-05 DIAGNOSIS — E1140 Type 2 diabetes mellitus with diabetic neuropathy, unspecified: Secondary | ICD-10-CM | POA: Diagnosis not present

## 2023-02-05 DIAGNOSIS — E1151 Type 2 diabetes mellitus with diabetic peripheral angiopathy without gangrene: Secondary | ICD-10-CM | POA: Insufficient documentation

## 2023-02-05 DIAGNOSIS — I739 Peripheral vascular disease, unspecified: Secondary | ICD-10-CM

## 2023-02-05 DIAGNOSIS — Z833 Family history of diabetes mellitus: Secondary | ICD-10-CM | POA: Insufficient documentation

## 2023-02-05 DIAGNOSIS — Z794 Long term (current) use of insulin: Secondary | ICD-10-CM | POA: Insufficient documentation

## 2023-02-05 DIAGNOSIS — F17210 Nicotine dependence, cigarettes, uncomplicated: Secondary | ICD-10-CM | POA: Insufficient documentation

## 2023-02-05 DIAGNOSIS — Z923 Personal history of irradiation: Secondary | ICD-10-CM | POA: Insufficient documentation

## 2023-02-05 DIAGNOSIS — Z85118 Personal history of other malignant neoplasm of bronchus and lung: Secondary | ICD-10-CM | POA: Insufficient documentation

## 2023-02-05 DIAGNOSIS — I509 Heart failure, unspecified: Secondary | ICD-10-CM | POA: Diagnosis not present

## 2023-02-05 DIAGNOSIS — E785 Hyperlipidemia, unspecified: Secondary | ICD-10-CM | POA: Insufficient documentation

## 2023-02-05 HISTORY — PX: PERIPHERAL VASCULAR INTERVENTION: CATH118257

## 2023-02-05 HISTORY — PX: ABDOMINAL AORTOGRAM W/LOWER EXTREMITY: CATH118223

## 2023-02-05 LAB — GLUCOSE, CAPILLARY
Glucose-Capillary: 182 mg/dL — ABNORMAL HIGH (ref 70–99)
Glucose-Capillary: 152 mg/dL — ABNORMAL HIGH (ref 70–99)

## 2023-02-05 LAB — POCT I-STAT, CHEM 8
BUN: 17 mg/dL (ref 8–23)
Calcium, Ion: 1.14 mmol/L — ABNORMAL LOW (ref 1.15–1.40)
Chloride: 102 mmol/L (ref 98–111)
Creatinine, Ser: 0.8 mg/dL (ref 0.61–1.24)
Glucose, Bld: 179 mg/dL — ABNORMAL HIGH (ref 70–99)
HCT: 33 % — ABNORMAL LOW (ref 39.0–52.0)
Hemoglobin: 11.2 g/dL — ABNORMAL LOW (ref 13.0–17.0)
Potassium: 3.8 mmol/L (ref 3.5–5.1)
Sodium: 140 mmol/L (ref 135–145)
TCO2: 29 mmol/L (ref 22–32)

## 2023-02-05 LAB — POCT ACTIVATED CLOTTING TIME: Activated Clotting Time: 205 s

## 2023-02-05 SURGERY — ABDOMINAL AORTOGRAM W/LOWER EXTREMITY
Anesthesia: LOCAL | Laterality: Right

## 2023-02-05 MED ORDER — OXYCODONE HCL 5 MG PO TABS: 5.0000 mg | ORAL_TABLET | ORAL | Status: DC | PRN

## 2023-02-05 MED ORDER — SODIUM CHLORIDE 0.9% FLUSH: 3.0000 mL | INTRAVENOUS | Status: DC | PRN

## 2023-02-05 MED ORDER — LIDOCAINE HCL (PF) 1 % IJ SOLN
INTRAMUSCULAR | Status: DC | PRN
  Administered 2023-02-05: 18 mL

## 2023-02-05 MED ORDER — ASPIRIN 81 MG PO CHEW
CHEWABLE_TABLET | ORAL | Status: DC | PRN
  Administered 2023-02-05: 81 mg via ORAL

## 2023-02-05 MED ORDER — CLOPIDOGREL BISULFATE 300 MG PO TABS
ORAL_TABLET | ORAL | Status: DC | PRN
  Administered 2023-02-05: 300 mg via ORAL

## 2023-02-05 MED ORDER — CLOPIDOGREL BISULFATE 75 MG PO TABS: 75.0000 mg | ORAL_TABLET | Freq: Every day | ORAL | Status: DC

## 2023-02-05 MED ORDER — ONDANSETRON HCL 4 MG/2ML IJ SOLN: 4.0000 mg | Freq: Four times a day (QID) | INTRAMUSCULAR | Status: DC | PRN

## 2023-02-05 MED ORDER — CLOPIDOGREL BISULFATE 300 MG PO TABS
ORAL_TABLET | ORAL | Status: AC
  Filled 2023-02-05: qty 1

## 2023-02-05 MED ORDER — HEPARIN SODIUM (PORCINE) 1000 UNIT/ML IJ SOLN
INTRAMUSCULAR | Status: DC | PRN
  Administered 2023-02-05: 7000 [IU] via INTRAVENOUS

## 2023-02-05 MED ORDER — MIDAZOLAM HCL 2 MG/2ML IJ SOLN
INTRAMUSCULAR | Status: DC | PRN
  Administered 2023-02-05: 1 mg via INTRAVENOUS

## 2023-02-05 MED ORDER — HYDRALAZINE HCL 20 MG/ML IJ SOLN: 5.0000 mg | INTRAMUSCULAR | Status: DC | PRN

## 2023-02-05 MED ORDER — ASPIRIN 81 MG PO CHEW
CHEWABLE_TABLET | ORAL | Status: AC
  Filled 2023-02-05: qty 1

## 2023-02-05 MED ORDER — CLOPIDOGREL BISULFATE 75 MG PO TABS
300.0000 mg | ORAL_TABLET | Freq: Once | ORAL | Status: DC
Start: 2023-02-05 — End: 2023-02-05

## 2023-02-05 MED ORDER — SODIUM CHLORIDE 0.9% FLUSH: 3.0000 mL | Freq: Two times a day (BID) | INTRAVENOUS | Status: DC

## 2023-02-05 MED ORDER — FENTANYL CITRATE (PF) 100 MCG/2ML IJ SOLN
INTRAMUSCULAR | Status: AC
  Filled 2023-02-05: qty 2

## 2023-02-05 MED ORDER — HEPARIN SODIUM (PORCINE) 1000 UNIT/ML IJ SOLN
INTRAMUSCULAR | Status: AC
  Filled 2023-02-05: qty 10

## 2023-02-05 MED ORDER — SODIUM CHLORIDE 0.9 % IV SOLN
INTRAVENOUS | Status: AC
Start: 2023-02-05 — End: 2023-02-05

## 2023-02-05 MED ORDER — HEPARIN (PORCINE) IN NACL 1000-0.9 UT/500ML-% IV SOLN
INTRAVENOUS | Status: DC | PRN
  Administered 2023-02-05: 500 mL

## 2023-02-05 MED ORDER — SODIUM CHLORIDE 0.9 % IV SOLN: 250.0000 mL | INTRAVENOUS | Status: DC | PRN

## 2023-02-05 MED ORDER — SODIUM CHLORIDE 0.9 % IV SOLN: INTRAVENOUS | Status: DC

## 2023-02-05 MED ORDER — IODIXANOL 320 MG/ML IV SOLN
INTRAVENOUS | Status: DC | PRN
  Administered 2023-02-05: 80 mL

## 2023-02-05 MED ORDER — LIDOCAINE HCL (PF) 1 % IJ SOLN
INTRAMUSCULAR | Status: AC
  Filled 2023-02-05: qty 30

## 2023-02-05 MED ORDER — CLOPIDOGREL BISULFATE 75 MG PO TABS: 75.0000 mg | ORAL_TABLET | Freq: Every day | ORAL | 11 refills | Status: DC

## 2023-02-05 MED ORDER — ASPIRIN 81 MG PO TBEC: 81.0000 mg | DELAYED_RELEASE_TABLET | Freq: Every day | ORAL | Status: DC

## 2023-02-05 MED ORDER — ACETAMINOPHEN 325 MG PO TABS
650.0000 mg | ORAL_TABLET | ORAL | Status: DC | PRN
Start: 2023-02-05 — End: 2023-02-05

## 2023-02-05 MED ORDER — FENTANYL CITRATE (PF) 100 MCG/2ML IJ SOLN
INTRAMUSCULAR | Status: DC | PRN
  Administered 2023-02-05: 25 ug via INTRAVENOUS

## 2023-02-05 MED ORDER — MIDAZOLAM HCL 2 MG/2ML IJ SOLN
INTRAMUSCULAR | Status: AC
  Filled 2023-02-05: qty 2

## 2023-02-05 SURGICAL SUPPLY — 20 items
BALLN MUSTANG 4X100X135 (BALLOONS) ×2
BALLN MUSTANG 6.0X40 135 (BALLOONS) ×2
BALLOON MUSTANG 4X100X135 (BALLOONS) IMPLANT
BALLOON MUSTANG 6.0X40 135 (BALLOONS) IMPLANT
CATH OMNI FLUSH 5F 65CM (CATHETERS) IMPLANT
CATH QUICKCROSS SUPP .035X90CM (MICROCATHETER) IMPLANT
COVER DOME SNAP 22 D (MISCELLANEOUS) IMPLANT
DEVICE TORQUE .025-.038 (MISCELLANEOUS) IMPLANT
GLIDEWIRE ADV .035X260CM (WIRE) IMPLANT
KIT ENCORE 26 ADVANTAGE (KITS) IMPLANT
KIT MICROPUNCTURE NIT STIFF (SHEATH) IMPLANT
SET ATX-X65L (MISCELLANEOUS) IMPLANT
SHEATH CATAPULT 6FR 45 (SHEATH) IMPLANT
SHEATH PINNACLE 5F 10CM (SHEATH) IMPLANT
SHEATH PINNACLE 6F 10CM (SHEATH) IMPLANT
SHEATH PROBE COVER 6X72 (BAG) IMPLANT
STENT INNOVA 7X40X130 (Permanent Stent) IMPLANT
STENT ZILVER PTX 5X100 (Permanent Stent) IMPLANT
TRAY PV CATH (CUSTOM PROCEDURE TRAY) ×2 IMPLANT
WIRE BENTSON .035X145CM (WIRE) IMPLANT

## 2023-02-05 NOTE — Discharge Instructions (Signed)
Femoral Site Care This sheet gives you information about how to care for yourself after your procedure. Your health care provider may also give you more specific instructions. If you have problems or questions, contact your health care provider. What can I expect after the procedure?  After the procedure, it is common to have: Bruising that usually fades within 1-2 weeks. Tenderness at the site. Follow these instructions at home: Wound care Follow instructions from your health care provider about how to take care of your insertion site. Make sure you: Wash your hands with soap and water before you change your bandage (dressing). If soap and water are not available, use hand sanitizer. Remove your dressing as told by your health care provider. In 24 hours Do not take baths, swim, or use a hot tub until your health care provider approves. You may shower 24-48 hours after the procedure or as told by your health care provider. Gently wash the site with plain soap and water. Pat the area dry with a clean towel. Do not rub the site. This may cause bleeding. Do not apply powder or lotion to the site. Keep the site clean and dry. Check your femoral site every day for signs of infection. Check for: Redness, swelling, or pain. Fluid or blood. Warmth. Pus or a bad smell. Activity For the first 2-3 days after your procedure, or as long as directed: Avoid climbing stairs as much as possible. Do not squat. Do not lift anything that is heavier than 10 lb (4.5 kg), or the limit that you are told, until your health care provider says that it is safe. For 5 days Rest as directed. Avoid sitting for a long time without moving. Get up to take short walks every 1-2 hours. Do not drive for 24 hours if you were given a medicine to help you relax (sedative). General instructions Take over-the-counter and prescription medicines only as told by your health care provider. Keep all follow-up visits as told by  your health care provider. This is important. Contact a health care provider if you have: A fever or chills. You have redness, swelling, or pain around your insertion site. Get help right away if: The catheter insertion area swells very fast. You pass out. You suddenly start to sweat or your skin gets clammy. The catheter insertion area is bleeding, and the bleeding does not stop when you hold steady pressure on the area. The area near or just beyond the catheter insertion site becomes pale, cool, tingly, or numb. These symptoms may represent a serious problem that is an emergency. Do not wait to see if the symptoms will go away. Get medical help right away. Call your local emergency services (911 in the [place] Do not drive yourself to the hospital. Summary After the procedure, it is common to have bruising that usually fades within 1-2 weeks. Check your femoral site every day for signs of infection. Do not lift anything that is heavier than 10 lb (4.5 kg), or the limit that you are told, until your health care provider says that it is safe. This information is not intended to replace advice given to you by your health care provider. Make sure you discuss any questions you have with your health care provider. Document Revised: 04/06/2017 Document Reviewed: 04/06/2017 Elsevier Patient Education  2020 Elsevier Inc. 

## 2023-02-05 NOTE — Progress Notes (Signed)
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

## 2023-02-05 NOTE — H&P (Signed)
History and Physical Interval Note:  02/05/2023 8:42 AM  John [name]  has presented today for surgery, with the diagnosis of peripheral vascular disease.  The various methods of treatment have been discussed with the patient and family. After consideration of risks, benefits and other options for treatment, the patient has consented to  Procedure(s): ABDOMINAL AORTOGRAM W/LOWER EXTREMITY (N/A) as a surgical intervention.  The patient's history has been reviewed, patient examined, no change in status, stable for surgery.  I have reviewed the patient's chart and labs.  Questions were answered to the patient's satisfaction.     Cephus Shelling     Patient name: John [name]        MRN: [id number]        DOB: July 26, 1945          Sex: male   REASON FOR VISIT: 6 month follow-up right leg PAD   HPI: John [name] is a 77 y.o. male with multiple medical comorbidities including hypertension, hyperlipidemia, diabetes, COPD, CHF, tobacco abuse that presents for 6 month follow-up of right leg PAD.  States that he has had cramping in his right leg when walking particularly in the calf.  Also has arthritis in his hips and now using a walker at times.  Still smoking about half a pack a day.    Initially underwent right common iliac stent with a right femoral endarterectomy on 08/20/2020 for short distance lifestyle limiting claudication.  He then developed recurrent symptoms in the right leg.  On 05/30/2021 he underwent right proximal common iliac artery angioplasty with drug-coated balloon, multiple right SFA and above-knee popliteal angioplasty with drug-coated balloon.         Past Medical History:  Diagnosis Date   Adenocarcinoma of lung, right (HCC) 04/18/2016   Anemia     Anxiety     Arthritis     Asthma     Atrial flutter (HCC)     BPH (benign prostatic hyperplasia)      with urinary retention 02/06/20   CHF (congestive heart failure) (HCC)     COPD (chronic obstructive pulmonary  disease) (HCC)     Depression     Diabetes mellitus, type II (HCC)     Dyspnea     History of kidney stones     History of radiation therapy      right lung 09/10/2020-09/17/2020   Dr Roselind Messier   History of radiation therapy      Left and right Lung- 06/19/22-06/26/22   Hyperlipidemia     Hypertension     Hypothyroidism     Macular degeneration     Neuropathy     Non-small cell lung cancer, right (HCC) 04/18/2016   Peripheral vascular disease (HCC)     Prostatitis     Pulmonary nodule, left 07/16/2016   Sleep apnea      cpap               Past Surgical History:  Procedure Laterality Date   A-FLUTTER ABLATION N/A 01/13/2022    Procedure: A-FLUTTER ABLATION;  Surgeon: Lanier Prude, MD;  Location: [facility];  Service: Cardiovascular;  Laterality: N/A;   ABDOMINAL AORTOGRAM W/LOWER EXTREMITY Left 02/06/2020    Procedure: ABDOMINAL AORTOGRAM W/LOWER EXTREMITY;  Surgeon: Runell Gess, MD;  Location: [facility];  Service: Cardiovascular;  Laterality: Left;   ABDOMINAL AORTOGRAM W/LOWER EXTREMITY N/A 05/30/2021    Procedure: ABDOMINAL AORTOGRAM W/LOWER EXTREMITY;  Surgeon: Cephus Shelling, MD;  Location: [facility];  Service: Cardiovascular;  Laterality: N/A;   BIOPSY   09/16/2021    Procedure: BIOPSY;  Surgeon: Iva Boop, MD;  Location: [facility] ENDOSCOPY;  Service: Gastroenterology;;   CARDIOVERSION N/A 10/05/2020    Procedure: CARDIOVERSION;  Surgeon: Sande Rives, MD;  Location: [facility] ENDOSCOPY;  Service: Cardiovascular;  Laterality: N/A;   CATARACT EXTRACTION, BILATERAL Bilateral     COLONOSCOPY WITH PROPOFOL N/A 06/20/2021    Procedure: COLONOSCOPY WITH PROPOFOL;  Surgeon: Hilarie Fredrickson, MD;  Location: [facility] ENDOSCOPY;  Service: Endoscopy;  Laterality: N/A;   COLONOSCOPY WITH PROPOFOL N/A 09/16/2021    Procedure: COLONOSCOPY WITH PROPOFOL;  Surgeon: Iva Boop, MD;  Location: [facility] ENDOSCOPY;  Service: Gastroenterology;  Laterality: N/A;    ENDARTERECTOMY FEMORAL Right 08/20/2020    Procedure: ENDARTERECTOMY  RIGHT FEMORAL ARTERY;  Surgeon: Cephus Shelling, MD;  Location: [facility] OR;  Service: Vascular;  Laterality: Right;   ENTEROSCOPY N/A 10/11/2021    Procedure: ENTEROSCOPY;  Surgeon: Dolores Frame, MD;  Location: [facility] ENDO SUITE;  Service: Gastroenterology;  Laterality: N/A;   ESOPHAGOGASTRODUODENOSCOPY N/A 09/16/2021    Procedure: ESOPHAGOGASTRODUODENOSCOPY (EGD);  Surgeon: Iva Boop, MD;  Location: [facility] ENDOSCOPY;  Service: Gastroenterology;  Laterality: N/A;   ESOPHAGOGASTRODUODENOSCOPY (EGD) WITH PROPOFOL N/A 09/06/2020    Procedure: ESOPHAGOGASTRODUODENOSCOPY (EGD) WITH PROPOFOL;  Surgeon: Hilarie Fredrickson, MD;  Location: [facility] ENDOSCOPY;  Service: Endoscopy;  Laterality: N/A;   ESOPHAGOGASTRODUODENOSCOPY (EGD) WITH PROPOFOL N/A 10/11/2021    Procedure: ESOPHAGOGASTRODUODENOSCOPY (EGD) WITH PROPOFOL;  Surgeon: Dolores Frame, MD;  Location: [facility] ENDO SU[facility];  Service: Gastroenterology;  Laterality: N/A;   HEMOSTASIS CLIP PLACEMENT   09/16/2021    Procedure: HEMOSTASIS CLIP PLACEMENT;  Surgeon: Iva Boop, MD;  Location: [facility] ENDOSCOPY;  Service: Gastroenterology;;   HOT HEMOSTASIS N/A 09/16/2021    Procedure: HOT HEMOSTASIS (ARGON PLASMA COAGULATION/BICAP);  Surgeon: Iva Boop, MD;  Location: [facility] ENDOSCOPY;  Service: Gastroenterology;  Laterality: N/A;   HOT HEMOSTASIS   10/11/2021    Procedure: HOT HEMOSTASIS (ARGON PLASMA COAGULATION/BICAP);  Surgeon: Marguerita Merles, Reuel Boom, MD;  Location: [facility] ENDO SUITE;  Service: Gastroenterology;;   INSERTION OF ILIAC STENT Right 08/20/2020    Procedure: RETROGRADE INSERTION OF RIGHT ILIAC STENT;  Surgeon: Cephus Shelling, MD;  Location: [facility] OR;  Service: Vascular;  Laterality: Right;   INTRAOPERATIVE ARTERIOGRAM Right 08/20/2020    Procedure: INTRA OPERATIVE ARTERIOGRAM ILIAC;  Surgeon: Cephus Shelling, MD;  Location: [facility] OR;  Service: Vascular;  Laterality: Right;    PATCH ANGIOPLASTY Right 08/20/2020    Procedure: PATCH ANGIOPLASTY RIGHT FEMORAL ARTERY;  Surgeon: Cephus Shelling, MD;  Location: [facility] OR;  Service: Vascular;  Laterality: Right;   PERIPHERAL VASCULAR BALLOON ANGIOPLASTY Right 05/30/2021    Procedure: PERIPHERAL VASCULAR BALLOON ANGIOPLASTY;  Surgeon: Cephus Shelling, MD;  Location: [facility];  Service: Cardiovascular;  Laterality: Right;   PORTACATH PLACEMENT Left 06/13/2016    Procedure: INSERTION PORT-A-CATH;  Surgeon: Franky Macho, MD;  Location: [facility] ORS;  Service: General;  Laterality: Left;   TRANSURETHRAL RESECTION OF PROSTATE N/A 05/31/2020    Procedure: TRANSURETHRAL RESECTION OF THE PROSTATE (TURP);  Surgeon: Malen Gauze, MD;  Location: [facility] ORS;  Service: Urology;  Laterality: N/A;   VIDEO BRONCHOSCOPY WITH ENDOBRONCHIAL NAVIGATION N/A 05/28/2016    Procedure: VIDEO BRONCHOSCOPY WITH ENDOBRONCHIAL NAVIGATION;  Surgeon: Loreli Slot, MD;  Location: [facility] OR;  Service: Thoracic;  Laterality: N/A;   VIDEO BRONCHOSCOPY WITH ENDOBRONCHIAL  ULTRASOUND N/A 05/28/2016    Procedure: VIDEO BRONCHOSCOPY WITH ENDOBRONCHIAL ULTRASOUND;  Surgeon: Loreli Slot, MD;  Location: [facility] OR;  Service: Thoracic;  Laterality: N/A;               Family History  Problem Relation Age of Onset   Hypertension Mother     Diabetes Father     Heart disease Father     Stroke Father     Hypertension Sister     Sleep apnea Neg Hx            SOCIAL HISTORY: Social History         Tobacco Use   Smoking status: Every Day      Current packs/day: 0.50      Average packs/day: 0.5 packs/day for 61.9 years (30.9 ttl pk-yrs)      Types: Cigarettes      Start date: 03/11/1961   Smokeless tobacco: Never   Tobacco comments:      1/2 pack to 1 pack per day 12/14/19  Substance Use Topics   Alcohol use: No      Allergies       Allergies  Allergen Reactions   Sulfa Antibiotics Swelling      Mouth swelling   Atorvastatin Other  (See Comments)      Muscle aches - tolerating Pravastatin 40 mg MWF   Jardiance [Empagliflozin] Other (See Comments)      FEELS SLUGGISH, TIRED   Lopressor [Metoprolol] Other (See Comments)      Fatigue   Rosuvastatin Other (See Comments)      Muscle aches - tolerating Pravastatin 40 mg MWF   Doxycycline Nausea Only   Tramadol Itching   Temazepam Other (See Comments)      Made insomnia worse               Current Outpatient Medications  Medication Sig Dispense Refill   albuterol (VENTOLIN HFA) 108 (90 Base) MCG/ACT inhaler INHALE 2 PUFFS INTO THE LUNGS EVERY 6 HOURS AS NEEDED FOR WHEEZE OR SHORTNESS OF BREATH 8.5 each 5   allopurinol (ZYLOPRIM) 300 MG tablet Take 1 tablet (300 mg total) by mouth daily. 90 tablet 1   aspirin EC 81 MG tablet Take 1 tablet (81 mg total) by mouth daily. Swallow whole. 90 tablet 3   Blood Glucose Monitoring Suppl (ONETOUCH VERIO REFLECT) w/Device KIT Use to test blood sugars daily as directed. DX: E11.9 1 kit 1   celecoxib (CELEBREX) 200 MG capsule Take 1 capsule (200 mg total) by mouth daily. 90 capsule 0   cetirizine (ZYRTEC ALLERGY) 10 MG tablet Take 1 tablet (10 mg total) by mouth daily. 90 tablet 1   Continuous Glucose Receiver (FREESTYLE LIBRE 3 READER) DEVI 1 Device by Does not apply route every 14 (fourteen) days. 1 each 3   Continuous Glucose Sensor (FREESTYLE LIBRE 3 SENSOR) MISC 1 Device by Does not apply route continuous. 1 each 2   dutasteride (AVODART) 0.5 MG capsule Take 1 capsule (0.5 mg total) by mouth daily. 90 capsule 3   ezetimibe (ZETIA) 10 MG tablet TAKE 1 TABLET BY MOUTH EVERY DAY 90 tablet 3   fluticasone (FLONASE) 50 MCG/ACT nasal spray PLACE 1 SPRAY INTO BOTH NOSTRILS DAILY AS NEEDED FOR ALLERGIES OR RHINITIS. 48 mL 1   Fluticasone-Umeclidin-Vilant (TRELEGY ELLIPTA) 100-62.5-25 MCG/ACT AEPB Inhale 1 puff into the lungs daily. 1 each 11   gabapentin (NEURONTIN) 300 MG capsule TAKE 1 CAPSULE BY MOUTH THREE TIMES A DAY 270  capsule 0    insulin degludec (TRESIBA FLEXTOUCH) 200 UNIT/ML FlexTouch Pen Inject 16 Units into the skin every evening. 12 mL 3   Insulin Pen Needle (BD PEN NEEDLE NANO U/F) 32G X 4 MM MISC Use to inject insulin once daily 100 each 3   KLOR-CON M20 20 MEQ tablet TAKE 1 TABLET BY MOUTH EVERY DAY 90 tablet 0   levothyroxine (SYNTHROID) 50 MCG tablet TAKE 1 TABLET BY MOUTH EVERY DAY 90 tablet 1   linaclotide (LINZESS) 72 MCG capsule Take 1 capsule (72 mcg total) by mouth daily before breakfast. 90 capsule 1   metoprolol succinate (TOPROL XL) 25 MG 24 hr tablet Take 0.5 tablets (12.5 mg total) by mouth daily. 45 tablet 3   NON FORMULARY CPAP at bedtime       ondansetron (ZOFRAN) 4 MG tablet Take 1 tablet (4 mg total) by mouth every 8 (eight) hours as needed for nausea or vomiting. 90 tablet 1   OneTouch Delica Lancets 30G MISC Use to test blood sugars daily as directed. DX: E11.9 100 each 1   OXYGEN Inhale 2 L into the lungs daily as needed (with Cpap at night).       pantoprazole (PROTONIX) 40 MG tablet Take 1 tablet (40 mg total) by mouth daily. 30 tablet 3   pravastatin (PRAVACHOL) 40 MG tablet TAKE 1 TABLET (40 MG TOTAL) BY MOUTH ON MONDAYS , WEDNESDAYS, AND FRIDAYS AS DIRECTED 48 tablet 1   spironolactone (ALDACTONE) 25 MG tablet Take 0.5 tablets (12.5 mg total) by mouth daily. 45 tablet 3   torsemide (DEMADEX) 20 MG tablet Take 1 tablet (20 mg total) by mouth daily. 90 tablet 2   zolpidem (AMBIEN) 5 MG tablet Take 1 tablet (5 mg total) by mouth at bedtime as needed for sleep. 30 tablet 2      No current facility-administered medications for this visit.        REVIEW OF SYSTEMS:  [X]  denotes positive finding, [ ]  denotes negative finding Cardiac   Comments:  Chest pain or chest pressure:      Shortness of breath upon exertion:      Short of breath when lying flat:      Irregular heart rhythm:             Vascular      Pain in calf, thigh, or hip brought on by ambulation: x Right calf  Pain in  feet at night that wakes you up from your sleep:       Blood clot in your veins:      Leg swelling:              Pulmonary      Oxygen at home:      Productive cough:       Wheezing:              Neurologic      Sudden weakness in arms or legs:       Sudden numbness in arms or legs:       Sudden onset of difficulty speaking or slurred speech:      Temporary loss of vision in one eye:       Problems with dizziness:              Gastrointestinal      Blood in stool:       Vomited blood:              Genitourinary  Burning when urinating:       Blood in urine:             Psychiatric      Major depression:              Hematologic      Bleeding problems:      Problems with blood clotting too easily:             Skin      Rashes or ulcers:             Constitutional      Fever or chills:          PHYSICAL EXAM: There were no vitals filed for this visit.     GENERAL: The patient is a well-nourished male, in no acute distress. The vital signs are documented above. CARDIAC: There is a regular rate and rhythm.  VASCULAR:  Previous right groin incision well healed Palpable femoral pulses bilaterally No palpable right pedal pulses but no tissue loss   DATA:    ABIs today 0.64 right and 0.84 left (previously >1)   Aortoiliac duplex shows high grade stenosis right EIA with velocity 404 and also stenosis right CIA with velocity 217    Right lower extremity arterial duplex shows high grade stenosis right SFA stent with velocity 441   Assessment/Plan:   77 year old male status post right common femoral endarterectomy with profundoplasty and bovine pericardial patch angioplasty and right common iliac stent on 08/20/2020 for short distance lifestyle limiting claudication.  He developed recurrent calf claudication and started back smoking.  Ultimately then underwent right common iliac angioplasty with DCB as well as right SFA and above-knee popliteal angioplasty with DCB  on 05/30/2021.  On surveillance today he has evidence of a high-grade stenosis in both the right external iliac artery as well as his right SFA.  I have recommended returning to the Cath Lab for aortogram with a right lower extremity arteriogram with possible intervention on his iliac artery including possibly the SFA as well in the right leg.  This could be angioplasty and/or additional stenting.  He has recurrent right leg claudication symptoms and have again enforced the importance of smoking cessation.  I discussed transfemoral access under moderate sedation in the Cath Lab with risk and benefits.         Cephus Shelling, MD [facility] Office: [phone]

## 2023-02-05 NOTE — Op Note (Signed)
Patient name: John [name] MRN: [id number] DOB: 10-03-1945 Sex: male  02/05/2023 Pre-operative Diagnosis: Recurrent lifestyle-limiting disabling claudication right leg Post-operative diagnosis:  Same Surgeon:  Cephus Shelling, MD Procedure Performed: 1.  Ultrasound-guided access left common femoral artery 2.  Aortogram with catheter selection of aorta 3.  Right lower extremity arteriogram including catheter selection of right SFA 4.  Angioplasty and stent of right SFA (5 mm x 100 mm drug-coated Zilver) 5.  Angioplasty and stent of right external iliac artery (7 mm x 40 mm Innova) 6.  56 minutes of monitored moderate conscious sedation time  Indications: Patient is a 77 year old male that has previously undergone right common femoral endarterectomy with profundoplasty and retrograde right common iliac stenting for lifestyle-limiting claudication on 08/20/2020.  He has had additional interventions including right SFA DCB.  Recently seen with increasing right leg symptoms consistent with claudication and then has evidence of both a right external iliac high-grade stenosis and a right SFA high-grade stenosis.  Presents for intervention after risks benefits discussed.  Findings:   Ultrasound-guided access left common femoral artery.  Abdominal aortogram showed the 2 renal arteries on the right and the left main renal patent.  Infrarenal aorta is widely patent.  He has severe calcified plaque in the left common iliac artery but he has no left leg complaints.  On the right his common iliac artery stent is widely patent.  He has a greater than 60%-70% stenosis of the right external iliac artery.  Distally the common femoral and profundus patent.  The SFA distally had a high-grade greater than 90% stenosis.  Beyond this he has a patent popliteal artery with three-vessel runoff.  Ultimately after evaluating images the right SFA high-grade stenosis over 90% was treated with a 5 mm x 100 mm  drug-coated Zilver postdilated with a 4 mm balloon.  The artery is small.  Elected for stent placement given he had DCB in the past with recurrence of stenosis.  We also elected to stent the right external iliac artery with a 7 mm x 40 mm Innova that was post-dilated with a 6 mm balloon.  Both stents widely patent at completion preserved runoff distally.   Procedure:  The patient was identified in the holding area and taken to room 8.  The patient was then placed supine on the table and prepped and draped in the usual sterile fashion.  A time out was called.  The patient received Versed and fentanyl for conscious moderate sedation.  Vital signs were monitored including heart rate, respiratory rate, oxygenation and blood pressure.  I was present for all moderate sedation.  Ultrasound was used to evaluate the left common femoral artery.  It was patent .  A digital ultrasound image was acquired.  A micropuncture needle was used to access the left common femoral artery under ultrasound guidance.  An 018 wire was advanced without resistance and a micropuncture sheath was placed.  The 018 wire was removed and a benson wire was placed.  The micropuncture sheath was exchanged for a 5 french sheath.  An omniflush catheter was advanced over the wire to the level of L-1.  An abdominal angiogram was obtained.  Next, using the omniflush catheter and a benson wire, the aortic bifurcation was crossed and the catheter was placed into theright external iliac artery and right runoff was obtained.  Ultimately after evaluating images elected for intervention.  I used a Glidewire advantage to cannulate the right SFA and we upsized to  a 6 French catapult sheath in the left groin over the aortic bifurcation.  Patient was given 100 units per kilogram IV heparin.  I then went down the right SFA across the distal SFA high-grade stenosis with a quick cross catheter and the Glidewire advantage.  This lesion was then stented with a 5 mm x 100  mm drug-coated Zilver postdilated with a 4 mm angioplasty balloon.  The artery was small only measuring about 4 mm.  We then went ahead and elected to stent the right external iliac stenosis with a 7 mm x 40 mm Innova postdilated with a 6 mm Mustang.  This was widely patent at completion with preserved runoff distally.  Wires and catheters were removed.  6 French sheath was placed in the left groin.  I elected not to close them due to significant iliac disease on the left.  Plan: Aspirin statin Plavix.  Follow-up in 1 month with aortoiliac duplex, right leg duplex, and ABIs.  Cephus Shelling, MD [facility] Office: [phone]

## 2023-02-06 LAB — POCT ACTIVATED CLOTTING TIME: Activated Clotting Time: 170 s

## 2023-02-09 ENCOUNTER — Ambulatory Visit (INDEPENDENT_AMBULATORY_CARE_PROVIDER_SITE_OTHER): Payer: Medicare Other

## 2023-02-09 DIAGNOSIS — I4892 Unspecified atrial flutter: Secondary | ICD-10-CM | POA: Diagnosis not present

## 2023-02-09 DIAGNOSIS — E1142 Type 2 diabetes mellitus with diabetic polyneuropathy: Secondary | ICD-10-CM | POA: Diagnosis not present

## 2023-02-09 DIAGNOSIS — I5033 Acute on chronic diastolic (congestive) heart failure: Secondary | ICD-10-CM | POA: Diagnosis not present

## 2023-02-09 DIAGNOSIS — E1159 Type 2 diabetes mellitus with other circulatory complications: Secondary | ICD-10-CM | POA: Diagnosis not present

## 2023-02-09 DIAGNOSIS — E1169 Type 2 diabetes mellitus with other specified complication: Secondary | ICD-10-CM | POA: Diagnosis not present

## 2023-02-09 DIAGNOSIS — I152 Hypertension secondary to endocrine disorders: Secondary | ICD-10-CM | POA: Diagnosis not present

## 2023-02-09 DIAGNOSIS — E1151 Type 2 diabetes mellitus with diabetic peripheral angiopathy without gangrene: Secondary | ICD-10-CM | POA: Diagnosis not present

## 2023-02-09 LAB — CUP PACEART REMOTE DEVICE CHECK
Date Time Interrogation Session: 20241104001700
Implantable Pulse Generator Implant Date: 20240126
Pulse Gen Serial Number: 104324

## 2023-02-11 ENCOUNTER — Telehealth: Payer: Self-pay | Admitting: *Deleted

## 2023-02-11 NOTE — Telephone Encounter (Signed)
Spoke with Rodney Booze from [facility] stating that she was on the phone with pt and he is wanting to know this same thing about his Linzess Rx. Explained to her that I would send a message to the clinical pool about it.

## 2023-02-11 NOTE — Telephone Encounter (Unsigned)
Copied from CRM [id number]. Topic: Clinical - Medication Question >> Feb 11, 2023 12:45 PM John [name] O wrote: Reason for CRM: patient calling to ask questions about medication. Patient wants to know can he take 2 pills instead of one cause the one is just not working

## 2023-02-12 NOTE — Telephone Encounter (Signed)
What medication is he wanting to take two of?

## 2023-02-12 NOTE — Telephone Encounter (Signed)
Called spoke with patient he states he is asking for linzess 2 of the 72 mg per hawks verbal okay to take 2 appt made

## 2023-02-13 ENCOUNTER — Ambulatory Visit (INDEPENDENT_AMBULATORY_CARE_PROVIDER_SITE_OTHER): Payer: Medicare Other | Admitting: Family

## 2023-02-13 ENCOUNTER — Encounter: Payer: Self-pay | Admitting: Family

## 2023-02-13 ENCOUNTER — Other Ambulatory Visit: Payer: Self-pay

## 2023-02-13 VITALS — BP 119/56 | HR 115 | Temp 98.0°F | Ht 66.0 in | Wt 155.0 lb

## 2023-02-13 DIAGNOSIS — I739 Peripheral vascular disease, unspecified: Secondary | ICD-10-CM

## 2023-02-13 DIAGNOSIS — T82398A Other mechanical complication of other vascular grafts, initial encounter: Secondary | ICD-10-CM

## 2023-02-13 DIAGNOSIS — E1142 Type 2 diabetes mellitus with diabetic polyneuropathy: Secondary | ICD-10-CM

## 2023-02-13 DIAGNOSIS — Z8739 Personal history of other diseases of the musculoskeletal system and connective tissue: Secondary | ICD-10-CM | POA: Diagnosis not present

## 2023-02-13 DIAGNOSIS — M25511 Pain in right shoulder: Secondary | ICD-10-CM

## 2023-02-13 DIAGNOSIS — I5033 Acute on chronic diastolic (congestive) heart failure: Secondary | ICD-10-CM

## 2023-02-13 DIAGNOSIS — M25561 Pain in right knee: Secondary | ICD-10-CM

## 2023-02-13 DIAGNOSIS — M25512 Pain in left shoulder: Secondary | ICD-10-CM

## 2023-02-13 DIAGNOSIS — Z95828 Presence of other vascular implants and grafts: Secondary | ICD-10-CM

## 2023-02-13 MED ORDER — CELECOXIB 200 MG PO CAPS: 200.0000 mg | ORAL_CAPSULE | Freq: Every day | ORAL | 0 refills | Status: DC

## 2023-02-13 MED ORDER — METHYLPREDNISOLONE ACETATE 80 MG/ML IJ SUSP
80.0000 mg | Freq: Once | INTRAMUSCULAR | Status: AC
  Administered 2023-02-13: 80 mg via INTRA_ARTICULAR

## 2023-02-13 MED ORDER — BUPIVACAINE HCL 0.25 % IJ SOLN
1.0000 mL | Freq: Once | INTRAMUSCULAR | Status: AC
  Administered 2023-02-13: 1 mL via INTRA_ARTICULAR

## 2023-02-13 MED ORDER — METHYLPREDNISOLONE ACETATE 80 MG/ML IJ SUSP: 80.0000 mg | Freq: Once | INTRAMUSCULAR | Status: DC

## 2023-02-13 NOTE — Patient Instructions (Signed)
 Acute Knee Pain, Adult Many things can cause knee pain. Sometimes, knee pain is sudden (acute). It may be caused by damage, swelling, or irritation of the muscles and tissues that support your knee. Pain may come from: A fall. An injury to the knee from twisting motions. A hit to the knee. Infection. The pain often goes away on its own with time and rest. If the pain does not go away, tests may be done to find out what is causing the pain. These may include: Imaging tests, such as an X-ray, MRI, CT scan, or ultrasound. Joint aspiration. In this test, fluid is removed from the knee and checked. Arthroscopy. In this test, a lighted tube is put in the knee and an image is shown on a screen. A biopsy. In this test, a health care provider will remove a small piece of tissue for testing. Follow these instructions at home: If you have a knee sleeve or brace that can be taken off:  Wear the knee sleeve or brace as told by your provider. Take it off only if your provider says that you can. Check the skin around it every day. Tell your provider if you see problems. Loosen the knee sleeve or brace if your toes tingle, are numb, or turn cold and blue. Keep the knee sleeve or brace clean and dry. Bathing If the knee sleeve or brace is not waterproof: Do not let it get wet. Cover it when you take a bath or shower. Use a cover that does not let any water in. Managing pain, stiffness, and swelling  If told, put ice on the area. If you have a knee sleeve or brace that you can take off, remove it as told. Put ice in a plastic bag. Place a towel between your skin and the bag. Leave the ice on for 20 minutes, 2-3 times a day. If your skin turns bright red, take off the ice right away to prevent skin damage. The risk of damage is higher if you cannot feel pain, heat, or cold. Move your toes often to reduce stiffness and swelling. Raise the injured area above the level of your heart while you are sitting  or lying down. Use a pillow to support your foot as needed. If told, use an elastic bandage to put pressure (compression) on your injured knee. This may control swelling, give support, and help with discomfort. Sleep with a pillow under your knee. Activity Rest your knee. Do not do things that cause pain or make pain worse. Do not stand or walk on your injured knee until you're told it's okay. Use crutches as told. Avoid activities where both feet leave the ground at the same time and put stress on the joints. Avoid running, jumping rope, and doing jumping jacks. Work with a physical therapist to make a safe exercise program if told. Physical therapy helps your knee move better and get stronger. Exercise as told. General instructions Take your medicines only as told by your provider. If you are overweight, work with your provider and an expert in healthy eating, called a dietician, to set goals to lose weight. Being overweight can make your knee hurt more. Do not smoke, vape, or use products with nicotine or tobacco in them. If you need help quitting, talk with your provider. Return to normal activities when you are told. Ask what things are safe for you to do. Watch for any changes in your symptoms. Keep all follow-up visits. Your provider will check  your healing and adjust treatments if needed. Contact a health care provider if: The knee pain does not stop. The knee pain changes or gets worse. You have a fever along with knee pain. Your knee is red or feels warm when you touch it. Your knee gives out or locks up. Get help right away if: Your knee swells and the swelling gets worse. You cannot move your knee. You have very bad knee pain that does not get better with medicine. This information is not intended to replace advice given to you by your health care provider. Make sure you discuss any questions you have with your health care provider. Document Revised: 05/19/2022 Document  Reviewed: 05/19/2022 Elsevier Patient Education  2024 [employer].

## 2023-02-13 NOTE — Progress Notes (Signed)
Subjective:    Patient ID: John [name], male    DOB: 1945/12/28, 77 y.o.   MRN: [id number]  Chief Complaint  Patient presents with   Gout   Pain    All on right side    Pt presents to the office today with right pain that started two days ago. Hurts to touch and warmth. Has hx of gout.  He takes allopurinol 300 mg daily and Celebrex 200 mg daily.  Knee Pain  The incident occurred 2 days ago. There was no injury mechanism. The pain is present in the right knee. The quality of the pain is described as aching. The pain is at a severity of 10/10. The pain is moderate. The pain has been Constant since onset. He reports no foreign bodies present. The symptoms are aggravated by weight bearing and movement.  Congestive Heart Failure Presents for follow-up visit. Associated symptoms include edema, fatigue and muscle weakness.  Diabetes He presents for his follow-up diabetic visit. He has type 2 diabetes mellitus. Associated symptoms include blurred vision and fatigue. Pertinent negatives for diabetes include no foot paresthesias. Risk factors for coronary artery disease include dyslipidemia, diabetes mellitus, hypertension and sedentary lifestyle. His overall blood glucose range is 140-180 mg/dl.      Review of Systems  Constitutional:  Positive for fatigue.  Eyes:  Positive for blurred vision.  Musculoskeletal:  Positive for muscle weakness.  All other systems reviewed and are negative.      Objective:   Physical Exam Vitals reviewed.  Constitutional:      General: He is not in acute distress.    Appearance: He is well-developed.  HENT:     Head: Normocephalic.     Right Ear: External ear normal.     Left Ear: External ear normal.  Eyes:     General:        Right eye: No discharge.        Left eye: No discharge.     Pupils: Pupils are equal, round, and reactive to light.  Neck:     Thyroid: No thyromegaly.  Cardiovascular:     Rate and Rhythm: Normal rate and regular  rhythm.     Heart sounds: Normal heart sounds. No murmur heard. Pulmonary:     Effort: Pulmonary effort is normal. No respiratory distress.     Breath sounds: Normal breath sounds. No wheezing.  Abdominal:     General: Bowel sounds are normal. There is no distension.     Palpations: Abdomen is soft.     Tenderness: There is no abdominal tenderness.  Musculoskeletal:        General: No tenderness. Normal range of motion.     Cervical back: Normal range of motion and neck supple.     Right lower leg: Edema (2+) present.     Left lower leg: Edema (2+) present.     Comments: Pain in right knee with flexion and extension, no redness, swelling, or warmth  Skin:    General: Skin is warm and dry.     Findings: No erythema or rash.  Neurological:     Mental Status: He is alert and oriented to person, place, and time.     Cranial Nerves: No cranial nerve deficit.     Deep Tendon Reflexes: Reflexes are normal and symmetric.  Psychiatric:        Behavior: Behavior normal.        Thought Content: Thought content normal.  Judgment: Judgment normal.     rightknee prepped with betadine Injected with Marcaine .5% plain and methylprednisolone with 25 guage needle x 1,Patient tolerated well.   BP (!) 119/56   Pulse (!) 115   Temp 98 F (36.7 C) (Temporal)   Ht 5\' 6"  (1.676 m)   Wt 155 lb (70.3 kg)   SpO2 97%   BMI 25.02 kg/m      Assessment & Plan:   KORDEL ARRIOLA comes in today with chief complaint of Gout and Pain (All on right side )   Diagnosis and orders addressed:  1. Acute pain of right knee Rest Continue Celebrex  - BMP8+EGFR - Uric acid - bupivacaine (MARCAINE) 0.25 % (with pres) injection 1 mL - methylPREDNISolone acetate (DEPO-MEDROL) injection 80 mg - CBC with Differential/Platelet  2. H/O: gout - BMP8+EGFR - Uric acid  3. Acute on chronic diastolic CHF (congestive heart failure) (HCC) Continue torsemide 40 mg daily Low salt diet - BMP8+EGFR -  Brain natriuretic peptide  4. Type 2 diabetes mellitus with diabetic polyneuropathy, without long-term current use of insulin (HCC) Low carb diet  - BMP8+EGFR  5. Pain of both shoulder joints - BMP8+EGFR   Labs pending Health Maintenance reviewed Diet and exercise encouraged  Follow up plan: Keep chronic follow up   Jannifer Rodney, FNP

## 2023-02-16 ENCOUNTER — Telehealth: Payer: Self-pay | Admitting: *Deleted

## 2023-02-16 ENCOUNTER — Telehealth: Payer: Self-pay | Admitting: Family

## 2023-02-16 ENCOUNTER — Telehealth: Payer: Self-pay | Admitting: Cardiovascular Disease

## 2023-02-16 NOTE — Telephone Encounter (Signed)
Patient called concerning increased heart rate and feeling tired. He stated his heart rate has been between 119-130 for over a week. He is also have SOB but think that may be from his COPD. He deny chest pain, dizziness, lightheadedness or any other symptoms at this time. He was seen by PCP last week and had labs drawn. Noted to have a Hemoglobin of 8.3 and he is reporting dark black stools. Advised patient that he should be seen in the ED for evaluations. Patient verbalized understanding and agree.

## 2023-02-16 NOTE — Telephone Encounter (Signed)
Access Nurse received call from [facility]....reporting critical lab, Hgb - 8.3

## 2023-02-16 NOTE — Telephone Encounter (Signed)
See result note.  

## 2023-02-16 NOTE — Telephone Encounter (Signed)
I have already spoke to patient about this. This was another call for the same thing.  Copied from CRM [id number]. Topic: Clinical - Medical Advice >> Feb 16, 2023  2:32 PM Larwance Sachs wrote: Reason for CRM: Patient called in to speak with Morrie Sheldon regarding hemoglobin being extremely low from blood work done last Friday. Patient stated he was told to go to the E.R. and wanted to confirm with Morrie Sheldon  Call patient back at [phone]

## 2023-02-16 NOTE — Telephone Encounter (Signed)
Patient aware and verbalized understanding. Went over lab results

## 2023-02-16 NOTE — Telephone Encounter (Signed)
  STAT if HR is under 50 or over 120 (normal HR is 60-100 beats per minute)  What is your heart rate? 127 this morning  Do you have a log of your heart rate readings (document readings)?  no  Do you have any other symptoms? Shortness of breath at times (patient has CHF), right shoulder hurts

## 2023-02-16 NOTE — Telephone Encounter (Signed)
Pt states he is going to ER

## 2023-02-17 ENCOUNTER — Observation Stay (HOSPITAL_COMMUNITY): Payer: Medicare Other

## 2023-02-17 ENCOUNTER — Encounter (HOSPITAL_COMMUNITY): Payer: Self-pay | Admitting: Emergency Medicine

## 2023-02-17 ENCOUNTER — Encounter: Payer: Self-pay | Admitting: Family

## 2023-02-17 ENCOUNTER — Emergency Department (HOSPITAL_COMMUNITY): Payer: Medicare Other

## 2023-02-17 ENCOUNTER — Ambulatory Visit (INDEPENDENT_AMBULATORY_CARE_PROVIDER_SITE_OTHER): Payer: Medicare Other | Admitting: Family

## 2023-02-17 ENCOUNTER — Inpatient Hospital Stay (HOSPITAL_COMMUNITY)
Admission: EM | Admit: 2023-02-17 | Discharge: 2023-02-19 | DRG: 378 | Disposition: A | Discharge status: Home Health | Payer: Medicare Other | Source: Ambulatory Visit | Attending: Internal Medicine | Admitting: Internal Medicine

## 2023-02-17 ENCOUNTER — Other Ambulatory Visit: Payer: Self-pay

## 2023-02-17 VITALS — BP 115/59 | HR 120 | Temp 97.6°F | Ht 66.0 in

## 2023-02-17 DIAGNOSIS — F17200 Nicotine dependence, unspecified, uncomplicated: Secondary | ICD-10-CM | POA: Diagnosis not present

## 2023-02-17 DIAGNOSIS — Z794 Long term (current) use of insulin: Secondary | ICD-10-CM | POA: Diagnosis not present

## 2023-02-17 DIAGNOSIS — I739 Peripheral vascular disease, unspecified: Secondary | ICD-10-CM | POA: Diagnosis not present

## 2023-02-17 DIAGNOSIS — D649 Anemia, unspecified: Principal | ICD-10-CM | POA: Diagnosis present

## 2023-02-17 DIAGNOSIS — K31811 Angiodysplasia of stomach and duodenum with bleeding: Principal | ICD-10-CM | POA: Diagnosis present

## 2023-02-17 DIAGNOSIS — D62 Acute posthemorrhagic anemia: Secondary | ICD-10-CM | POA: Diagnosis present

## 2023-02-17 DIAGNOSIS — Z888 Allergy status to other drugs, medicaments and biological substances status: Secondary | ICD-10-CM

## 2023-02-17 DIAGNOSIS — H3530 Unspecified macular degeneration: Secondary | ICD-10-CM | POA: Diagnosis present

## 2023-02-17 DIAGNOSIS — Z95820 Peripheral vascular angioplasty status with implants and grafts: Secondary | ICD-10-CM | POA: Diagnosis not present

## 2023-02-17 DIAGNOSIS — Z7951 Long term (current) use of inhaled steroids: Secondary | ICD-10-CM

## 2023-02-17 DIAGNOSIS — E782 Mixed hyperlipidemia: Secondary | ICD-10-CM | POA: Diagnosis not present

## 2023-02-17 DIAGNOSIS — Z923 Personal history of irradiation: Secondary | ICD-10-CM

## 2023-02-17 DIAGNOSIS — J9611 Chronic respiratory failure with hypoxia: Secondary | ICD-10-CM | POA: Diagnosis present

## 2023-02-17 DIAGNOSIS — J449 Chronic obstructive pulmonary disease, unspecified: Secondary | ICD-10-CM | POA: Diagnosis present

## 2023-02-17 DIAGNOSIS — E1151 Type 2 diabetes mellitus with diabetic peripheral angiopathy without gangrene: Secondary | ICD-10-CM | POA: Diagnosis present

## 2023-02-17 DIAGNOSIS — E1142 Type 2 diabetes mellitus with diabetic polyneuropathy: Secondary | ICD-10-CM | POA: Diagnosis present

## 2023-02-17 DIAGNOSIS — J4489 Other specified chronic obstructive pulmonary disease: Secondary | ICD-10-CM | POA: Diagnosis present

## 2023-02-17 DIAGNOSIS — K921 Melena: Secondary | ICD-10-CM | POA: Diagnosis not present

## 2023-02-17 DIAGNOSIS — Z833 Family history of diabetes mellitus: Secondary | ICD-10-CM

## 2023-02-17 DIAGNOSIS — D6832 Hemorrhagic disorder due to extrinsic circulating anticoagulants: Secondary | ICD-10-CM | POA: Diagnosis present

## 2023-02-17 DIAGNOSIS — G4733 Obstructive sleep apnea (adult) (pediatric): Secondary | ICD-10-CM | POA: Diagnosis not present

## 2023-02-17 DIAGNOSIS — Z8249 Family history of ischemic heart disease and other diseases of the circulatory system: Secondary | ICD-10-CM

## 2023-02-17 DIAGNOSIS — E039 Hypothyroidism, unspecified: Secondary | ICD-10-CM | POA: Diagnosis present

## 2023-02-17 DIAGNOSIS — Z8719 Personal history of other diseases of the digestive system: Secondary | ICD-10-CM | POA: Diagnosis not present

## 2023-02-17 DIAGNOSIS — I5032 Chronic diastolic (congestive) heart failure: Secondary | ICD-10-CM | POA: Diagnosis not present

## 2023-02-17 DIAGNOSIS — Z87442 Personal history of urinary calculi: Secondary | ICD-10-CM

## 2023-02-17 DIAGNOSIS — Z7902 Long term (current) use of antithrombotics/antiplatelets: Secondary | ICD-10-CM | POA: Diagnosis not present

## 2023-02-17 DIAGNOSIS — K5730 Diverticulosis of large intestine without perforation or abscess without bleeding: Secondary | ICD-10-CM | POA: Diagnosis not present

## 2023-02-17 DIAGNOSIS — C3491 Malignant neoplasm of unspecified part of right bronchus or lung: Secondary | ICD-10-CM | POA: Diagnosis present

## 2023-02-17 DIAGNOSIS — N400 Enlarged prostate without lower urinary tract symptoms: Secondary | ICD-10-CM | POA: Diagnosis present

## 2023-02-17 DIAGNOSIS — F32A Depression, unspecified: Secondary | ICD-10-CM | POA: Diagnosis present

## 2023-02-17 DIAGNOSIS — R0602 Shortness of breath: Secondary | ICD-10-CM

## 2023-02-17 DIAGNOSIS — D509 Iron deficiency anemia, unspecified: Secondary | ICD-10-CM | POA: Diagnosis not present

## 2023-02-17 DIAGNOSIS — Z87891 Personal history of nicotine dependence: Secondary | ICD-10-CM

## 2023-02-17 DIAGNOSIS — E119 Type 2 diabetes mellitus without complications: Secondary | ICD-10-CM | POA: Diagnosis not present

## 2023-02-17 DIAGNOSIS — I70201 Unspecified atherosclerosis of native arteries of extremities, right leg: Secondary | ICD-10-CM | POA: Diagnosis present

## 2023-02-17 DIAGNOSIS — I110 Hypertensive heart disease with heart failure: Secondary | ICD-10-CM | POA: Diagnosis present

## 2023-02-17 DIAGNOSIS — Z9079 Acquired absence of other genital organ(s): Secondary | ICD-10-CM

## 2023-02-17 DIAGNOSIS — Z79899 Other long term (current) drug therapy: Secondary | ICD-10-CM | POA: Diagnosis not present

## 2023-02-17 DIAGNOSIS — R000 Tachycardia, unspecified: Secondary | ICD-10-CM | POA: Diagnosis not present

## 2023-02-17 DIAGNOSIS — R531 Weakness: Secondary | ICD-10-CM | POA: Diagnosis not present

## 2023-02-17 DIAGNOSIS — K31819 Angiodysplasia of stomach and duodenum without bleeding: Secondary | ICD-10-CM | POA: Diagnosis not present

## 2023-02-17 DIAGNOSIS — R1013 Epigastric pain: Secondary | ICD-10-CM | POA: Diagnosis not present

## 2023-02-17 DIAGNOSIS — R6881 Early satiety: Secondary | ICD-10-CM | POA: Diagnosis not present

## 2023-02-17 DIAGNOSIS — Z7982 Long term (current) use of aspirin: Secondary | ICD-10-CM

## 2023-02-17 DIAGNOSIS — K5520 Angiodysplasia of colon without hemorrhage: Secondary | ICD-10-CM | POA: Diagnosis present

## 2023-02-17 DIAGNOSIS — Z7901 Long term (current) use of anticoagulants: Secondary | ICD-10-CM

## 2023-02-17 DIAGNOSIS — T45525A Adverse effect of antithrombotic drugs, initial encounter: Secondary | ICD-10-CM | POA: Diagnosis present

## 2023-02-17 DIAGNOSIS — Z882 Allergy status to sulfonamides status: Secondary | ICD-10-CM

## 2023-02-17 DIAGNOSIS — M25561 Pain in right knee: Secondary | ICD-10-CM

## 2023-02-17 DIAGNOSIS — Z452 Encounter for adjustment and management of vascular access device: Secondary | ICD-10-CM | POA: Diagnosis not present

## 2023-02-17 DIAGNOSIS — Z9221 Personal history of antineoplastic chemotherapy: Secondary | ICD-10-CM

## 2023-02-17 DIAGNOSIS — Z85118 Personal history of other malignant neoplasm of bronchus and lung: Secondary | ICD-10-CM

## 2023-02-17 DIAGNOSIS — R918 Other nonspecific abnormal finding of lung field: Secondary | ICD-10-CM | POA: Diagnosis not present

## 2023-02-17 DIAGNOSIS — D500 Iron deficiency anemia secondary to blood loss (chronic): Secondary | ICD-10-CM | POA: Diagnosis not present

## 2023-02-17 DIAGNOSIS — R110 Nausea: Secondary | ICD-10-CM | POA: Diagnosis not present

## 2023-02-17 DIAGNOSIS — R5383 Other fatigue: Secondary | ICD-10-CM | POA: Diagnosis not present

## 2023-02-17 DIAGNOSIS — K8020 Calculus of gallbladder without cholecystitis without obstruction: Secondary | ICD-10-CM | POA: Diagnosis not present

## 2023-02-17 DIAGNOSIS — Z8711 Personal history of peptic ulcer disease: Secondary | ICD-10-CM

## 2023-02-17 DIAGNOSIS — E1169 Type 2 diabetes mellitus with other specified complication: Secondary | ICD-10-CM | POA: Diagnosis present

## 2023-02-17 DIAGNOSIS — Z881 Allergy status to other antibiotic agents status: Secondary | ICD-10-CM

## 2023-02-17 DIAGNOSIS — Z79890 Hormone replacement therapy (postmenopausal): Secondary | ICD-10-CM

## 2023-02-17 DIAGNOSIS — Z823 Family history of stroke: Secondary | ICD-10-CM

## 2023-02-17 LAB — COMPREHENSIVE METABOLIC PANEL
ALT: 11 U/L (ref 0–44)
AST: 13 U/L — ABNORMAL LOW (ref 15–41)
Albumin: 3.3 g/dL — ABNORMAL LOW (ref 3.5–5.0)
Alkaline Phosphatase: 86 U/L (ref 38–126)
Anion gap: 9 (ref 5–15)
BUN: 27 mg/dL — ABNORMAL HIGH (ref 8–23)
CO2: 29 mmol/L (ref 22–32)
Calcium: 8.6 mg/dL — ABNORMAL LOW (ref 8.9–10.3)
Chloride: 94 mmol/L — ABNORMAL LOW (ref 98–111)
Creatinine, Ser: 1.11 mg/dL (ref 0.61–1.24)
GFR, Estimated: >60 mL/min (ref >60)
Glucose, Bld: 216 mg/dL — ABNORMAL HIGH (ref 70–99)
Potassium: 3.9 mmol/L (ref 3.5–5.1)
Sodium: 132 mmol/L — ABNORMAL LOW (ref 135–145)
Total Bilirubin: 0.3 mg/dL (ref <1.2)
Total Protein: 7.3 g/dL (ref 6.5–8.1)

## 2023-02-17 LAB — IRON AND TIBC
Iron: 20 ug/dL — ABNORMAL LOW (ref 45–182)
Saturation Ratios: 4 % — ABNORMAL LOW (ref 17.9–39.5)
TIBC: 541 ug/dL — ABNORMAL HIGH (ref 250–450)
UIBC: 521 ug/dL

## 2023-02-17 LAB — CBC WITH DIFFERENTIAL/PLATELET
Abs Immature Granulocytes: 0.04 10*3/uL (ref 0.00–0.07)
Basophils Absolute: 0.1 10*3/uL (ref 0.0–0.2)
Basophils Absolute: 0.1 10*3/uL (ref 0.0–0.2)
Basophils Absolute: 0.1 10*3/uL (ref 0.0–0.1)
Basophils Relative: 1 %
Basos: 1 %
Basos: 1 %
EOS (ABSOLUTE): 0.3 10*3/uL (ref 0.0–0.4)
EOS (ABSOLUTE): 0.2 10*3/uL (ref 0.0–0.4)
Eos: 2 %
Eos: 2 %
Eosinophils Absolute: 0.3 10*3/uL (ref 0.0–0.5)
Eosinophils Relative: 3 %
HCT: 25 % — ABNORMAL LOW (ref 39.0–52.0)
Hematocrit: 28.7 % — ABNORMAL LOW (ref 37.5–51.0)
Hematocrit: 24.3 % — ABNORMAL LOW (ref 37.5–51.0)
Hemoglobin: 8.3 g/dL — CL (ref 13.0–17.7)
Hemoglobin: 7.1 g/dL — CL (ref 13.0–17.7)
Hemoglobin: 7.3 g/dL — ABNORMAL LOW (ref 13.0–17.0)
Immature Grans (Abs): 0 10*3/uL (ref 0.0–0.1)
Immature Grans (Abs): 0.2 10*3/uL — ABNORMAL HIGH (ref 0.0–0.1)
Immature Granulocytes: 0 %
Immature Granulocytes: 2 %
Immature Granulocytes: 0 %
Lymphocytes Absolute: 0.5 10*3/uL — ABNORMAL LOW (ref 0.7–3.1)
Lymphocytes Absolute: 0.4 10*3/uL — ABNORMAL LOW (ref 0.7–3.1)
Lymphocytes Relative: 5 %
Lymphs Abs: 0.5 10*3/uL — ABNORMAL LOW (ref 0.7–4.0)
Lymphs: 4 %
Lymphs: 4 %
MCH: 26.4 pg — ABNORMAL LOW (ref 26.6–33.0)
MCH: 26.6 pg (ref 26.6–33.0)
MCH: 26.4 pg (ref 26.0–34.0)
MCHC: 28.9 g/dL — ABNORMAL LOW (ref 31.5–35.7)
MCHC: 29.2 g/dL — ABNORMAL LOW (ref 31.5–35.7)
MCHC: 29.2 g/dL — ABNORMAL LOW (ref 30.0–36.0)
MCV: 91 fL (ref 79–97)
MCV: 91 fL (ref 79–97)
MCV: 90.6 fL (ref 80.0–100.0)
Monocytes Absolute: 0.8 10*3/uL (ref 0.1–0.9)
Monocytes Absolute: 0.8 10*3/uL (ref 0.1–0.9)
Monocytes Absolute: 0.8 10*3/uL (ref 0.1–1.0)
Monocytes Relative: 8 %
Monocytes: 7 %
Monocytes: 7 %
Neutro Abs: 8.9 10*3/uL — ABNORMAL HIGH (ref 1.7–7.7)
Neutrophils Absolute: 10.2 10*3/uL — ABNORMAL HIGH (ref 1.4–7.0)
Neutrophils Absolute: 9.3 10*3/uL — ABNORMAL HIGH (ref 1.4–7.0)
Neutrophils Relative %: 83 %
Neutrophils: 86 %
Neutrophils: 84 %
Platelets: 436 10*3/uL (ref 150–450)
Platelets: 444 10*3/uL (ref 150–450)
Platelets: 422 10*3/uL — ABNORMAL HIGH (ref 150–400)
RBC: 3.14 x10E6/uL — ABNORMAL LOW (ref 4.14–5.80)
RBC: 2.67 x10E6/uL — CL (ref 4.14–5.80)
RBC: 2.76 MIL/uL — ABNORMAL LOW (ref 4.22–5.81)
RDW: 15.2 % (ref 11.6–15.4)
RDW: 14.9 % (ref 11.6–15.4)
RDW: 16.4 % — ABNORMAL HIGH (ref 11.5–15.5)
WBC: 12 10*3/uL — ABNORMAL HIGH (ref 3.4–10.8)
WBC: 10.9 10*3/uL — ABNORMAL HIGH (ref 3.4–10.8)
WBC: 10.6 10*3/uL — ABNORMAL HIGH (ref 4.0–10.5)
nRBC: 0 % (ref 0.0–0.2)

## 2023-02-17 LAB — PREPARE RBC (CROSSMATCH)

## 2023-02-17 LAB — URINALYSIS, ROUTINE W REFLEX MICROSCOPIC
Bilirubin Urine: NEGATIVE
Glucose, UA: NEGATIVE mg/dL
Hgb urine dipstick: NEGATIVE
Ketones, ur: NEGATIVE mg/dL
Leukocytes,Ua: NEGATIVE
Nitrite: NEGATIVE
Protein, ur: NEGATIVE mg/dL
Specific Gravity, Urine: 1.008 (ref 1.005–1.030)
pH: 6 (ref 5.0–8.0)

## 2023-02-17 LAB — POC OCCULT BLOOD, ED: Fecal Occult Bld: NEGATIVE

## 2023-02-17 LAB — BMP8+EGFR
BUN/Creatinine Ratio: 20 (ref 10–24)
BUN: 22 mg/dL (ref 8–27)
CO2: 22 mmol/L (ref 20–29)
Calcium: 8.9 mg/dL (ref 8.6–10.2)
Chloride: 94 mmol/L — ABNORMAL LOW (ref 96–106)
Creatinine, Ser: 1.09 mg/dL (ref 0.76–1.27)
Glucose: 216 mg/dL — ABNORMAL HIGH (ref 70–99)
Potassium: 4.1 mmol/L (ref 3.5–5.2)
Sodium: 137 mmol/L (ref 134–144)
eGFR: 70 mL/min/{1.73_m2} (ref >59)

## 2023-02-17 LAB — FOLATE: Folate: 13 ng/mL (ref >5.9)

## 2023-02-17 LAB — FERRITIN: Ferritin: 10 ng/mL — ABNORMAL LOW (ref 24–336)

## 2023-02-17 LAB — PROTIME-INR
INR: 1 (ref 0.8–1.2)
Prothrombin Time: 13.3 s (ref 11.4–15.2)

## 2023-02-17 LAB — RETICULOCYTES
Immature Retic Fract: 27.3 % — ABNORMAL HIGH (ref 2.3–15.9)
RBC.: 2.74 MIL/uL — ABNORMAL LOW (ref 4.22–5.81)
Retic Count, Absolute: 83 10*3/uL (ref 19.0–186.0)
Retic Ct Pct: 3 % (ref 0.4–3.1)

## 2023-02-17 LAB — HEMOGLOBIN AND HEMATOCRIT, BLOOD
HCT: 29.4 % — ABNORMAL LOW (ref 39.0–52.0)
Hemoglobin: 8.7 g/dL — ABNORMAL LOW (ref 13.0–17.0)

## 2023-02-17 LAB — BRAIN NATRIURETIC PEPTIDE: BNP: 104.3 pg/mL — ABNORMAL HIGH (ref 0.0–100.0)

## 2023-02-17 LAB — URIC ACID: Uric Acid: 5.2 mg/dL (ref 3.8–8.4)

## 2023-02-17 LAB — HEMOGLOBIN, FINGERSTICK: Hemoglobin: 7.2 g/dL — ABNORMAL LOW (ref 12.6–17.7)

## 2023-02-17 LAB — VITAMIN B12: Vitamin B-12: 268 pg/mL (ref 180–914)

## 2023-02-17 MED ORDER — ALBUTEROL SULFATE (2.5 MG/3ML) 0.083% IN NEBU
2.5000 mg | INHALATION_SOLUTION | Freq: Four times a day (QID) | RESPIRATORY_TRACT | Status: DC | PRN
  Administered 2023-02-18: 2.5 mg via RESPIRATORY_TRACT
  Filled 2023-02-17: qty 3

## 2023-02-17 MED ORDER — ACETAMINOPHEN 650 MG RE SUPP: 650.0000 mg | Freq: Four times a day (QID) | RECTAL | Status: DC | PRN

## 2023-02-17 MED ORDER — ONDANSETRON HCL 4 MG PO TABS: 4.0000 mg | ORAL_TABLET | Freq: Four times a day (QID) | ORAL | Status: DC | PRN

## 2023-02-17 MED ORDER — SODIUM CHLORIDE 0.9% IV SOLUTION: Freq: Once | INTRAVENOUS | Status: AC

## 2023-02-17 MED ORDER — IOHEXOL 300 MG/ML  SOLN
100.0000 mL | Freq: Once | INTRAMUSCULAR | Status: AC | PRN
  Administered 2023-02-17: 100 mL via INTRAVENOUS

## 2023-02-17 MED ORDER — UMECLIDINIUM BROMIDE 62.5 MCG/ACT IN AEPB
1.0000 | INHALATION_SPRAY | Freq: Every day | RESPIRATORY_TRACT | Status: DC
  Administered 2023-02-18 – 2023-02-19 (×2): 1 via RESPIRATORY_TRACT
  Filled 2023-02-17: qty 7

## 2023-02-17 MED ORDER — INSULIN ASPART 100 UNIT/ML IJ SOLN
0.0000 [IU] | Freq: Four times a day (QID) | INTRAMUSCULAR | Status: DC
  Administered 2023-02-18: 2 [IU] via SUBCUTANEOUS
  Administered 2023-02-18: 3 [IU] via SUBCUTANEOUS
  Administered 2023-02-19: 2 [IU] via SUBCUTANEOUS

## 2023-02-17 MED ORDER — IOHEXOL 300 MG/ML  SOLN
30.0000 mL | Freq: Once | INTRAMUSCULAR | Status: AC | PRN
  Administered 2023-02-17: 30 mL via ORAL

## 2023-02-17 MED ORDER — PANTOPRAZOLE SODIUM 40 MG IV SOLR
40.0000 mg | Freq: Two times a day (BID) | INTRAVENOUS | Status: DC
Start: 2023-02-18 — End: 2023-02-19
  Administered 2023-02-18 – 2023-02-19 (×3): 40 mg via INTRAVENOUS
  Filled 2023-02-17 (×3): qty 10

## 2023-02-17 MED ORDER — ALLOPURINOL 300 MG PO TABS
300.0000 mg | ORAL_TABLET | Freq: Every day | ORAL | Status: DC
  Administered 2023-02-18 – 2023-02-19 (×2): 300 mg via ORAL
  Filled 2023-02-17 (×2): qty 1

## 2023-02-17 MED ORDER — SPIRONOLACTONE 12.5 MG HALF TABLET: 12.5000 mg | ORAL_TABLET | Freq: Every day | ORAL | Status: DC

## 2023-02-17 MED ORDER — FLUTICASONE FUROATE-VILANTEROL 100-25 MCG/ACT IN AEPB
1.0000 | INHALATION_SPRAY | Freq: Every day | RESPIRATORY_TRACT | Status: DC
  Administered 2023-02-18 – 2023-02-19 (×2): 1 via RESPIRATORY_TRACT
  Filled 2023-02-17: qty 28

## 2023-02-17 MED ORDER — PANTOPRAZOLE SODIUM 40 MG IV SOLR
40.0000 mg | Freq: Once | INTRAVENOUS | Status: AC
  Administered 2023-02-17: 40 mg via INTRAVENOUS
  Filled 2023-02-17: qty 10

## 2023-02-17 MED ORDER — GABAPENTIN 300 MG PO CAPS
300.0000 mg | ORAL_CAPSULE | Freq: Three times a day (TID) | ORAL | Status: DC
  Administered 2023-02-17 – 2023-02-19 (×5): 300 mg via ORAL
  Filled 2023-02-17 (×6): qty 1

## 2023-02-17 MED ORDER — ZOLPIDEM TARTRATE 5 MG PO TABS: 5.0000 mg | ORAL_TABLET | Freq: Every evening | ORAL | Status: DC | PRN

## 2023-02-17 MED ORDER — TORSEMIDE 20 MG PO TABS
20.0000 mg | ORAL_TABLET | Freq: Two times a day (BID) | ORAL | Status: DC
  Administered 2023-02-17: 20 mg via ORAL
  Filled 2023-02-17: qty 1

## 2023-02-17 MED ORDER — POLYETHYLENE GLYCOL 3350 17 G PO PACK: 17.0000 g | PACK | Freq: Every day | ORAL | Status: DC | PRN

## 2023-02-17 MED ORDER — LEVOTHYROXINE SODIUM 50 MCG PO TABS
50.0000 ug | ORAL_TABLET | Freq: Every day | ORAL | Status: DC
  Filled 2023-02-17 (×2): qty 1

## 2023-02-17 MED ORDER — LINACLOTIDE 72 MCG PO CAPS
72.0000 ug | ORAL_CAPSULE | Freq: Every day | ORAL | Status: DC
  Administered 2023-02-18: 72 ug via ORAL
  Filled 2023-02-17 (×3): qty 1

## 2023-02-17 MED ORDER — ONDANSETRON HCL 4 MG/2ML IJ SOLN: 4.0000 mg | Freq: Four times a day (QID) | INTRAMUSCULAR | Status: DC | PRN

## 2023-02-17 MED ORDER — ACETAMINOPHEN 325 MG PO TABS
650.0000 mg | ORAL_TABLET | Freq: Four times a day (QID) | ORAL | Status: DC | PRN
  Filled 2023-02-17: qty 2

## 2023-02-17 NOTE — Assessment & Plan Note (Signed)
   Resume CPAP nightly

## 2023-02-17 NOTE — ED Triage Notes (Signed)
Pt sent by PCP for evaluation of low Hgb, per pt had surgery last Thursday through right groin and had stent placed, told to hold baby ASA, Plavix, Celebrex.

## 2023-02-17 NOTE — Assessment & Plan Note (Signed)
A1c 7.8. - SSI- M -Hold Tresiba 16 units nightly, n.p.o. midnight

## 2023-02-17 NOTE — Progress Notes (Signed)
Subjective:    Patient ID: John [name], male    DOB: 07/09/1945, 77 y.o.   MRN: [id number]  Chief Complaint  Patient presents with   Knee Pain   hgb low   PT presents to the office today to follow up on right knee pain and low hgb. He was seen on 02/13/23 with right knee pain and was given a steroid injection. Reports his pain is.  His labs showed a Hgb of 8.3. Worrisome for GI bleed, he was recently started on Plavix. Already taking aspirin 81 mg and Celebrex 200 mg daily.  Knee Pain  The incident occurred more than 1 week ago. The quality of the pain is described as aching. The pain is at a severity of 4/10. The pain is mild. Pertinent negatives include no numbness or tingling. He reports no foreign bodies present. The symptoms are aggravated by movement and weight bearing.  Anemia Presents for follow-up visit. Symptoms include bruises/bleeds easily and malaise/fatigue. There has been no confusion, fever, leg swelling, light-headedness or pica.      Review of Systems  Constitutional:  Positive for malaise/fatigue. Negative for fever.  Neurological:  Negative for tingling, light-headedness and numbness.  Hematological:  Bruises/bleeds easily.  Psychiatric/Behavioral:  Negative for confusion.   All other systems reviewed and are negative.      Objective:   Physical Exam Vitals reviewed.  Constitutional:      General: He is not in acute distress.    Appearance: He is well-developed.  HENT:     Head: Normocephalic.  Eyes:     General:        Right eye: No discharge.        Left eye: No discharge.     Pupils: Pupils are equal, round, and reactive to light.  Neck:     Thyroid: No thyromegaly.  Cardiovascular:     Rate and Rhythm: Normal rate and regular rhythm.     Heart sounds: Normal heart sounds. No murmur heard. Pulmonary:     Effort: Pulmonary effort is normal. No respiratory distress.     Breath sounds: Normal breath sounds. No wheezing.  Abdominal:      General: Bowel sounds are normal. There is no distension.     Palpations: Abdomen is soft.     Tenderness: There is no abdominal tenderness.  Musculoskeletal:        General: No tenderness. Normal range of motion.     Cervical back: Normal range of motion and neck supple.     Right lower leg: Edema (1 +) present.     Left lower leg: Edema (1+) present.  Skin:    General: Skin is warm and dry.     Findings: No erythema or rash.  Neurological:     Mental Status: He is alert and oriented to person, place, and time.     Cranial Nerves: No cranial nerve deficit.     Deep Tendon Reflexes: Reflexes are normal and symmetric.  Psychiatric:        Behavior: Behavior normal.        Thought Content: Thought content normal.        Judgment: Judgment normal.       BP (!) 115/59   Pulse (!) 120   Temp 97.6 F (36.4 C) (Temporal)   Ht 5\' 6"  (1.676 m)   SpO2 98%   BMI 25.02 kg/m      Assessment & Plan:  John [name] comes in today with  chief complaint of Knee Pain and hgb low   Diagnosis and orders addressed:  1. Acute pain of right knee  2. Low hemoglobin - Hemoglobin, fingerstick - CBC with Differential/Platelet  3. SOB (shortness of breath)  4. Tachycardia   Labs pending Hgb has dropped since Friday Stop Celebrex Hold aspirin and Plavix Pt sent to ED given symptoms, hgb not <7 but having SOB and weakness     Jannifer Rodney, FNP

## 2023-02-17 NOTE — Assessment & Plan Note (Addendum)
Hemoglobin down to 7.3, downtrend from 11.2- 12 days ago.  Reports black stools, stool occult-very limited stool sample, negative for blood. Patient recently underwent right lower extremity arteriogram, angioplasty and stent of right SFA, and right external iliac artery-by Dr. Chestine Spore. Anemia panel suggestive of anemia of iron deficiency, with low iron of 20, low iron saturation, and increased TIBC.  On aspirin and Plavix. -Last Plavix dose 2 days ago 11/10. -N.p.o. midnight -Obtain CT abdomen and pelvis with contrast, rule out intra-abdominal/retroperitoneal bleed -GI consult a.m. -Hold aspirin and Plavix -IV Protonix 40 twice daily -Transfuse 1 unit PRBC for now - Trend Hgb -Resume torsemide 20 mg twice daily

## 2023-02-17 NOTE — ED Provider Triage Note (Signed)
Emergency Medicine Provider Triage Evaluation Note  John [name] , a 77 y.o. male  was evaluated in triage.  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  His hemoglobin was checked yesterday in their office and was around 8, rechecked today and it was 7.2.  He was sent here for blood transfusion..  Review of Systems  Positive: Weakness and fatigue, dark stools. Negative: Abdominal pain, shortness of breath, chest pain  Physical Exam  BP 111/60 (BP Location: Left Arm)   Pulse (!) 112   Temp 98.2 F (36.8 C) (Oral)   Resp 18   Ht 5\' 6"  (1.676 m)   Wt 70.3 kg   SpO2 95%   BMI 25.01 kg/m  Gen:   Awake, no distress   Resp:  Normal effort  MSK:   Moves extremities without difficulty  Other:    Medical Decision Making  Medically screening exam initiated at 3:00 PM.  Appropriate orders placed.  AN REEDS was informed that the remainder of the evaluation will be completed by another provider, this initial triage assessment does not replace that evaluation, and the importance of remaining in the ED until their evaluation is complete.     Burgess Amor, PA-C 02/17/23 1502

## 2023-02-17 NOTE — Assessment & Plan Note (Addendum)
Stable and compensated.  Last echo 09/2021 EF of 50 to 60%. -Resume torsemide 20 mg BID

## 2023-02-17 NOTE — Assessment & Plan Note (Signed)
Patient recently underwent right lower extremity arteriogram, angioplasty and stent of right SFA, and right external iliac artery-by Dr. Chestine Spore.  He did well with the procedure. -Hold aspirin and Plavix for now in the setting of acute anemia

## 2023-02-17 NOTE — ED Provider Notes (Signed)
[place] EMERGENCY DEPARTMENT AT [facility] Provider Note   CSN: [id number] Arrival date & time: 02/17/23  1219     History  Chief Complaint  Patient presents with   Abnormal Labs    Hgb    John [name] is a 77 y.o. male.  Patient has a history of lung cancer COPD heart failure diabetes.  He complains of fatigue.  He recently had a stent work done in his left groin and he has been on Plavix and aspirin.  Patient complains of some dark stools.  He has a history of cauterizing some with intestines for bleeding  The history is provided by the patient and medical records.  Weakness Severity:  Moderate Onset quality:  Sudden Timing:  Constant Progression:  Worsening Chronicity:  New Context: not alcohol use   Relieved by:  Nothing Worsened by:  Nothing Ineffective treatments:  None tried Associated symptoms: no abdominal pain, no chest pain, no cough, no diarrhea, no frequency, no headaches and no seizures        Home Medications Prior to Admission medications   Medication Sig Start Date End Date Taking? Authorizing Provider  aspirin EC 81 MG tablet Take 1 tablet (81 mg total) by mouth daily. Swallow whole. 05/02/22  Yes Lanier Prude, MD  albuterol (VENTOLIN HFA) 108 (90 Base) MCG/ACT inhaler INHALE 2 PUFFS INTO THE LUNGS EVERY 6 HOURS AS NEEDED FOR WHEEZE OR SHORTNESS OF BREATH 01/19/23   Jannifer Rodney A, FNP  allopurinol (ZYLOPRIM) 300 MG tablet Take 1 tablet (300 mg total) by mouth daily. 09/16/22 09/16/23  Jannifer Rodney A, FNP  Blood Glucose Monitoring Suppl (ONETOUCH VERIO REFLECT) w/Device KIT Use to test blood sugars daily as directed. DX: E11.9 10/14/19   Junie Spencer, FNP  cetirizine (ZYRTEC ALLERGY) 10 MG tablet Take 1 tablet (10 mg total) by mouth daily. Patient taking differently: Take 10 mg by mouth daily as needed for allergies. 07/18/22   Junie Spencer, FNP  clopidogrel (PLAVIX) 75 MG tablet Take 1 tablet (75 mg total) by mouth daily.  02/05/23 02/05/24  Cephus Shelling, MD  Continuous Glucose Receiver (FREESTYLE LIBRE 3 READER) DEVI 1 Device by Does not apply route every 14 (fourteen) days. 08/11/22   Junie Spencer, FNP  Continuous Glucose Sensor (FREESTYLE LIBRE 3 SENSOR) MISC 1 Device by Does not apply route continuous. 08/11/22   Jannifer Rodney A, FNP  dutasteride (AVODART) 0.5 MG capsule Take 1 capsule (0.5 mg total) by mouth daily. 10/31/22   McKenzie, Mardene Celeste, MD  famotidine (PEPCID) 20 MG tablet Take 20 mg by mouth. 12/10/22   [provider]  fluticasone (FLONASE) 50 MCG/ACT nasal spray PLACE 1 SPRAY INTO BOTH NOSTRILS DAILY AS NEEDED FOR ALLERGIES OR RHINITIS. 07/01/22   Jannifer Rodney A, FNP  Fluticasone-Umeclidin-Vilant (TRELEGY ELLIPTA) 100-62.5-25 MCG/ACT AEPB Inhale 1 puff into the lungs daily. 12/26/22   Junie Spencer, FNP  gabapentin (NEURONTIN) 300 MG capsule TAKE 1 CAPSULE BY MOUTH THREE TIMES A DAY 12/01/22   Hawks, [name] A, FNP  insulin degludec (TRESIBA FLEXTOUCH) 200 UNIT/ML FlexTouch Pen Inject 16 Units into the skin every evening. 12/29/22   Dani Gobble, NP  Insulin Pen Needle (BD PEN NEEDLE NANO U/F) 32G X 4 MM MISC Use to inject insulin once daily 12/29/22   Dani Gobble, NP  levothyroxine (SYNTHROID) 50 MCG tablet TAKE 1 TABLET BY MOUTH EVERY DAY 12/03/22   Junie Spencer, FNP  linaclotide (LINZESS) 72 MCG  capsule Take 1 capsule (72 mcg total) by mouth daily before breakfast. 01/06/23   Hawks, Edilia Bo, FNP  NON FORMULARY CPAP at bedtime    [provider]  ondansetron (ZOFRAN) 4 MG tablet Take 1 tablet (4 mg total) by mouth every 8 (eight) hours as needed for nausea or vomiting. 12/26/22   Junie Spencer, FNP  OneTouch Delica Lancets 30G MISC Use to test blood sugars daily as directed. DX: E11.9 02/24/22   Dani Gobble, NP  OXYGEN Inhale 2 L into the lungs daily as needed (with Cpap at night).    [provider]  pantoprazole (PROTONIX) 40 MG tablet  Take 1 tablet (40 mg total) by mouth daily. 01/06/23   Jannifer Rodney A, FNP  pravastatin (PRAVACHOL) 40 MG tablet TAKE 1 TABLET (40 MG TOTAL) BY MOUTH ON MONDAYS , WEDNESDAYS, AND FRIDAYS AS DIRECTED 10/23/22   Jannifer Rodney A, FNP  spironolactone (ALDACTONE) 25 MG tablet Take 0.5 tablets (12.5 mg total) by mouth daily. 12/30/22 03/30/23  Jodelle Gross, NP  torsemide (DEMADEX) 20 MG tablet Take 1 tablet (20 mg total) by mouth daily. Patient taking differently: Take 40 mg by mouth daily. 03/03/22   Junie Spencer, FNP  zolpidem (AMBIEN) 5 MG tablet Take 1 tablet (5 mg total) by mouth at bedtime as needed for sleep. Patient taking differently: Take 5 mg by mouth at bedtime. 01/06/23   Junie Spencer, FNP      Allergies    Sulfa antibiotics, Atorvastatin, Jardiance [empagliflozin], Lopressor [metoprolol], Rosuvastatin, Doxycycline, Tramadol, and Temazepam    Review of Systems   Review of Systems  Constitutional:  Negative for appetite change and fatigue.  HENT:  Negative for congestion, ear discharge and sinus pressure.   Eyes:  Negative for discharge.  Respiratory:  Negative for cough.   Cardiovascular:  Negative for chest pain.  Gastrointestinal:  Negative for abdominal pain and diarrhea.  Genitourinary:  Negative for frequency and hematuria.  Musculoskeletal:  Negative for back pain.  Skin:  Negative for rash.  Neurological:  Positive for weakness. Negative for seizures and headaches.  Psychiatric/Behavioral:  Negative for hallucinations.     Physical Exam Updated Vital Signs BP 130/62   Pulse (!) 112   Temp 98.3 F (36.8 C) (Oral)   Resp 18   Ht 5\' 6"  (1.676 m)   Wt 70.3 kg   SpO2 90%   BMI 25.01 kg/m  Physical Exam Vitals and nursing note reviewed.  Constitutional:      Appearance: He is well-developed.  HENT:     Head: Normocephalic.     Nose: Nose normal.  Eyes:     General: No scleral icterus.    Conjunctiva/sclera: Conjunctivae normal.  Neck:      Thyroid: No thyromegaly.  Cardiovascular:     Rate and Rhythm: Normal rate and regular rhythm.     Heart sounds: No murmur heard.    No friction rub. No gallop.  Pulmonary:     Breath sounds: No stridor. No wheezing or rales.  Chest:     Chest wall: No tenderness.  Abdominal:     General: There is no distension.     Tenderness: There is no abdominal tenderness. There is no rebound.  Genitourinary:    Comments: Rectal exam shows very small amount of stool that was heme-negative Musculoskeletal:        General: Normal range of motion.     Cervical back: Neck supple.  Lymphadenopathy:  Cervical: No cervical adenopathy.  Skin:    Findings: No erythema or rash.  Neurological:     Mental Status: He is alert and oriented to person, place, and time.     Motor: No abnormal muscle tone.     Coordination: Coordination normal.  Psychiatric:        Behavior: Behavior normal.     ED Results / Procedures / Treatments   Labs (all labs ordered are listed, but only abnormal results are displayed) Labs Reviewed  COMPREHENSIVE METABOLIC PANEL - Abnormal; Notable for the following components:      Result Value   Sodium 132 (*)    Chloride 94 (*)    Glucose, Bld 216 (*)    BUN 27 (*)    Calcium 8.6 (*)    Albumin 3.3 (*)    AST 13 (*)    All other components within normal limits  CBC WITH DIFFERENTIAL/PLATELET - Abnormal; Notable for the following components:   WBC 10.6 (*)    RBC 2.76 (*)    Hemoglobin 7.3 (*)    HCT 25.0 (*)    MCHC 29.2 (*)    RDW 16.4 (*)    Platelets 422 (*)    Neutro Abs 8.9 (*)    Lymphs Abs 0.5 (*)    All other components within normal limits  URINALYSIS, ROUTINE W REFLEX MICROSCOPIC - Abnormal; Notable for the following components:   Color, Urine STRAW (*)    All other components within normal limits  IRON AND TIBC - Abnormal; Notable for the following components:   Iron 20 (*)    TIBC 541 (*)    Saturation Ratios 4 (*)    All other components  within normal limits  RETICULOCYTES - Abnormal; Notable for the following components:   RBC. 2.74 (*)    Immature Retic Fract 27.3 (*)    All other components within normal limits  PROTIME-INR  FOLATE  VITAMIN B12  FERRITIN  POC OCCULT BLOOD, ED  TYPE AND SCREEN  PREPARE RBC (CROSSMATCH)    EKG None  Radiology DG Chest Portable 1 View  Result Date: 02/17/2023 CLINICAL DATA:  Weakness EXAM: PORTABLE CHEST 1 VIEW COMPARISON:  Chest x-ray 08/11/2022.  Chest CT 12/16/2022. FINDINGS: Left chest port catheter tip ends in the SVC. Loop recorder device overlies the chest, unchanged. Focal opacities in the bilateral upper lobes appear unchanged from prior CT 12/16/2022. No new focal lung infiltrate, pleural effusion or pneumothorax. Cardiomediastinal silhouette appears within limits. IMPRESSION: Focal opacities in the bilateral upper lobes appear unchanged from prior CT 12/16/2022. No new focal lung infiltrate. Electronically Signed   By: Darliss Cheney M.D.   On: 02/17/2023 16:39    Procedures Procedures    Medications Ordered in ED Medications  0.9 %  sodium chloride infusion (Manually program via Guardrails IV Fluids) ( Intravenous New Bag/Given 02/17/23 1643)  pantoprazole (PROTONIX) injection 40 mg (40 mg Intravenous Given 02/17/23 1651)    ED Course/ Medical Decision Making/ A&P   CRITICAL CARE Performed by: Bethann Berkshire Total critical care time: 45 minutes Critical care time was exclusive of separately billable procedures and treating other patients. Critical care was necessary to treat or prevent imminent or life-threatening deterioration. Critical care was time spent personally by me on the following activities: development of treatment plan with patient and/or surrogate as well as nursing, discussions with consultants, evaluation of patient's response to treatment, examination of patient, obtaining history from patient or surrogate, ordering and performing treatments and  interventions, ordering and review of laboratory studies, ordering and review of radiographic studies, pulse oximetry and re-evaluation of patient's condition.  Click here for ABCD2, HEART and other calculatorsREFRESH Note before signing :1}                              Medical Decision Making Amount and/or Complexity of Data Reviewed Labs: ordered. Radiology: ordered.  Risk Prescription drug management. Decision regarding hospitalization.  This patient presents to the ED for concern of fatigue, this involves an extensive number of treatment options, and is a complaint that carries with it a high risk of complications and morbidity.  The differential diagnosis includes anemia, MI   Co morbidities that complicate the patient evaluation  COPD, heart failure   Additional history obtained:  Additional history obtained from patient External records from outside source obtained and reviewed including full records   Lab Tests:  I Ordered, and personally interpreted labs.  The pertinent results include: Hemoglobin 7.3   Imaging Studies ordered:  I ordered imaging studies including chest x-ray I independently visualized and interpreted imaging which showed no acute disease I agree with the radiologist interpretation   Cardiac Monitoring: / EKG:  The patient was maintained on a cardiac monitor.  I personally viewed and interpreted the cardiac monitored which showed an underlying rhythm of: Normal sinus rhythm   Consultations Obtained:  I requested consultation with the hospitalist,  and discussed lab and imaging findings as well as pertinent plan - they recommend: Admit   Problem List / ED Course / Critical interventions / Medication management  Anemia I ordered medication including admit and give packed red blood cells Reevaluation of the patient after these medicines showed that the patient improved I have reviewed the patients home medicines and have made adjustments as  needed   Social Determinants of Health:  None   Test / Admission - Considered:  None  Patient with somatic anemia.  He will be admitted to medicine and transfused 2 units of blood.        Final Clinical Impression(s) / ED Diagnoses Final diagnoses:  Symptomatic anemia    Rx / DC Orders ED Discharge Orders     None         Bethann Berkshire, MD 02/20/23 1715

## 2023-02-17 NOTE — Assessment & Plan Note (Deleted)
Resume dutasteride

## 2023-02-17 NOTE — H&P (Signed)
History and Physical    John [name] ZOX:[id number] DOB: 21-Aug-1945 DOA: 02/17/2023  PCP: Junie Spencer, FNP   Patient coming from: Home  I have personally briefly reviewed patient's old medical records in [facility]  Chief Complaint: Weakness  HPI: John [name] is a 77 y.o. male with medical history significant for peripheral artery disease, OSA on CPAP, diabetes mellitus, hypertension, COPD, diastolic CHF, lung cancer. Patient presented to the ED with complaints of difficulty breathing with exertion and generalized fatigue, over the past 2 weeks at least. Patient recently underwent right lower extremity arteriogram, angioplasty and stent of right SFA, and right external iliac artery-by Dr. Chestine Spore.  He did well with the procedure. He is unsure if his symptoms started before or after this procedure.  Spouse reports she noticed patient's changing color and becoming more pale after the procedure.  No abdominal pain. Patient also reports over the past 2 weeks has had black stools.  No vomiting.  No gross blood in stools. He is on aspirin and Plavix.  His last dose of Plavix was 2 days ago-02/15/2023.  He was sent to the ED from his outpatient providers office with reports of low hemoglobin of 7.2.  ED Course: Tmax 98.3.  Heart rate 101-114.  Respiratory 11-18.  Blood pressure systolic 111-130.  O2 sats 90 to 97% on room air. Hgb down to 7.3.  Anemia panel suggesting anemia of iron deficiency.  Chest x-ray -unchanged old focal opacities no new abnormalities. Stool occult-EDP very little stool obtained, was negative for blood PRBC ordered for transfusion.  Hospitalist to admit for symptomatic anemia.  Review of Systems: As per HPI all other systems reviewed and negative.  Past Medical History:  Diagnosis Date   Adenocarcinoma of lung, right (HCC) 04/18/2016   Anemia    Anxiety    Arthritis    Asthma    Atrial flutter (HCC)    BPH (benign prostatic hyperplasia)    with  urinary retention 02/06/20   CHF (congestive heart failure) (HCC)    COPD (chronic obstructive pulmonary disease) (HCC)    Depression    Diabetes mellitus, type II (HCC)    Dyspnea    History of kidney stones    History of radiation therapy    right lung 09/10/2020-09/17/2020   Dr Roselind Messier   History of radiation therapy    Left and right Lung- 06/19/22-06/26/22   Hyperlipidemia    Hypertension    Hypothyroidism    Macular degeneration    Neuropathy    Non-small cell lung cancer, right (HCC) 04/18/2016   Peripheral vascular disease (HCC)    Prostatitis    Pulmonary nodule, left 07/16/2016   Sleep apnea    cpap    Past Surgical History:  Procedure Laterality Date   A-FLUTTER ABLATION N/A 01/13/2022   Procedure: A-FLUTTER ABLATION;  Surgeon: Lanier Prude, MD;  [facility]: [facility];  Service: Cardiovascular;  Laterality: N/A;   ABDOMINAL AORTOGRAM W/LOWER EXTREMITY Left 02/06/2020   Procedure: ABDOMINAL AORTOGRAM W/LOWER EXTREMITY;  Surgeon: Runell Gess, MD;  Location: [facility];  Service: Cardiovascular;  Laterality: Left;   ABDOMINAL AORTOGRAM W/LOWER EXTREMITY N/A 05/30/2021   Procedure: ABDOMINAL AORTOGRAM W/LOWER EXTREMITY;  Surgeon: Cephus Shelling, MD;  Location: [facility];  Service: Cardiovascular;  Laterality: N/A;   ABDOMINAL AORTOGRAM W/LOWER EXTREMITY N/A 02/05/2023   Procedure: ABDOMINAL AORTOGRAM W/LOWER EXTREMITY;  Surgeon: Cephus Shelling, MD;  Location: [facility];  Service: Cardiovascular;  Laterality: N/A;   BIOPSY  09/16/2021   Procedure: BIOPSY;  Surgeon: Iva Boop, MD;  Location: [name] ENDOSCOPY;  Service: Gastroenterology;;   CARDIOVERSION N/A 10/05/2020   Procedure: CARDIOVERSION;  Surgeon: Sande Rives, MD;  Location: [facility] ENDOSCOPY;  Service: Cardiovascular;  Laterality: N/A;   CATARACT EXTRACTION, BILATERAL Bilateral    COLONOSCOPY WITH PROPOFOL N/A 06/20/2021   Procedure: COLONOSCOPY WITH PROPOFOL;   Surgeon: Hilarie Fredrickson, MD;  Location: [facility] ENDOSCOPY;  Service: Endoscopy;  Laterality: N/A;   COLONOSCOPY WITH PROPOFOL N/A 09/16/2021   Procedure: COLONOSCOPY WITH PROPOFOL;  Surgeon: Iva Boop, MD;  Location: [facility] ENDOSCOPY;  Service: Gastroenterology;  Laterality: N/A;   ENDARTERECTOMY FEMORAL Right 08/20/2020   Procedure: ENDARTERECTOMY  RIGHT FEMORAL ARTERY;  Surgeon: Cephus Shelling, MD;  Location: [facility] OR;  Service: Vascular;  Laterality: Right;   ENTEROSCOPY N/A 10/11/2021   Procedure: ENTEROSCOPY;  Surgeon: Dolores Frame, MD;  Location: [facility]O SU[facility];  Service: Gastroenterology;  Laterality: N/A;   ESOPHAGOGASTRODUODENOSCOPY N/A 09/16/2021   Procedure: ESOPHAGOGASTRODUODENOSCOPY (EGD);  Surgeon: Iva Boop, MD;  Location: [facility] ENDOSCOPY;  Service: Gastroenterology;  Laterality: N/A;   ESOPHAGOGASTRODUODENOSCOPY (EGD) WITH PROPOFOL N/A 09/06/2020   Procedure: ESOPHAGOGASTRODUODENOSCOPY (EGD) WITH PROPOFOL;  Surgeon: Hilarie Fredrickson, MD;  Location: [facility] ENDOSCOPY;  Service: Endoscopy;  Laterality: N/A;   ESOPHAGOGASTRODUODENOSCOPY (EGD) WITH PROPOFOL N/A 10/11/2021   Procedure: ESOPHAGOGASTRODUODENOSCOPY (EGD) WITH PROPOFOL;  Surgeon: Dolores Frame, MD;  Location: [facility];  Service: Gastroenterology;  Laterality: N/A;   HEMOSTASIS CLIP PLACEMENT  09/16/2021   Procedure: HEMOSTASIS CLIP PLACEMENT;  Surgeon: Iva Boop, MD;  Location: [facility] ENDOSCOPY;  Service: Gastroenterology;;   HOT HEMOSTASIS N/A 09/16/2021   Procedure: HOT HEMOSTASIS (ARGON PLASMA COAGULATION/BICAP);  Surgeon: Iva Boop, MD;  Location: [facility] ENDOSCOPY;  Service: Gastroenterology;  Laterality: N/A;   HOT HEMOSTASIS  10/11/2021   Procedure: HOT HEMOSTASIS (ARGON PLASMA COAGULATION/BICAP);  Surgeon: Marguerita Merles, Reuel Boom, MD;  Location: [facility];  Service: Gastroenterology;;   INSERTION OF ILIAC STENT Right 08/20/2020   Procedure: RETROGRADE INSERTION OF RIGHT ILIAC STENT;  Surgeon:  Cephus Shelling, MD;  Location: [facility] OR;  Service: Vascular;  Laterality: Right;   INTRAOPERATIVE ARTERIOGRAM Right 08/20/2020   Procedure: INTRA OPERATIVE ARTERIOGRAM ILIAC;  Surgeon: Cephus Shelling, MD;  Location: [facility] OR;  Service: Vascular;  Laterality: Right;   PATCH ANGIOPLASTY Right 08/20/2020   Procedure: PATCH ANGIOPLASTY RIGHT FEMORAL ARTERY;  Surgeon: Cephus Shelling, MD;  Location: [facility] OR;  Service: Vascular;  Laterality: Right;   PERIPHERAL VASCULAR BALLOON ANGIOPLASTY Right 05/30/2021   Procedure: PERIPHERAL VASCULAR BALLOON ANGIOPLASTY;  Surgeon: Cephus Shelling, MD;  [facility];  Service: Cardiovascular;  Laterality: Right;   PERIPHERAL VASCULAR INTERVENTION Right 02/05/2023   Procedure: PERIPHERAL VASCULAR INTERVENTION;  Surgeon: Cephus Shelling, MD;  Location: [facility];  Service: Cardiovascular;  Laterality: Right;  Rt SFA and Rt Ext Iliac   PORTACATH PLACEMENT Left 06/13/2016   Procedure: INSERTION PORT-A-CATH;  Surgeon: Franky Macho, MD;  Location: [facility] ORS;  Service: General;  Laterality: Left;   TRANSURETHRAL RESECTION OF PROSTATE N/A 05/31/2020   Procedure: TRANSURETHRAL RESECTION OF THE PROSTATE (TURP);  Surgeon: Malen Gauze, MD;  Location: [facility] ORS;  Service: Urology;  Laterality: N/A;   VIDEO BRONCHOSCOPY WITH ENDOBRONCHIAL NAVIGATION N/A 05/28/2016   Procedure: VIDEO BRONCHOSCOPY WITH ENDOBRONCHIAL NAVIGATION;  Surgeon: Loreli Slot, MD;  Location: [facility] OR;  Service: Thoracic;  Laterality: N/A;   VIDEO BRONCHOSCOPY WITH ENDOBRONCHIAL ULTRASOUND N/A 05/28/2016   Procedure: VIDEO BRONCHOSCOPY WITH ENDOBRONCHIAL ULTRASOUND;  Surgeon: Loreli Slot, MD;  Location: [facility] OR;  Service: Thoracic;  Laterality: N/A;     reports that he has been smoking cigarettes. He started smoking about 61 years ago. He has a 31 pack-year smoking history. He has never used smokeless tobacco. He reports that he does not drink alcohol and  does not use drugs.  Allergies  Allergen Reactions   Sulfa Antibiotics Swelling    Mouth swelling   Atorvastatin Other (See Comments)    Muscle aches - tolerating Pravastatin 40 mg MWF   Jardiance [Empagliflozin] Other (See Comments)    FEELS SLUGGISH, TIRED   Lopressor [Metoprolol] Other (See Comments)    Fatigue   Rosuvastatin Other (See Comments)    Muscle aches - tolerating Pravastatin 40 mg MWF   Doxycycline Nausea Only   Tramadol Itching   Temazepam Other (See Comments)    Made insomnia worse     Family History  Problem Relation Age of Onset   Hypertension Mother    Diabetes Father    Heart disease Father    Stroke Father    Hypertension Sister    Sleep apnea Neg Hx     Prior to Admission medications   Medication Sig Start Date End Date Taking? Authorizing Provider  albuterol (VENTOLIN HFA) 108 (90 Base) MCG/ACT inhaler INHALE 2 PUFFS INTO THE LUNGS EVERY 6 HOURS AS NEEDED FOR WHEEZE OR SHORTNESS OF BREATH 01/19/23   Hawks, Christy A, FNP  allopurinol (ZYLOPRIM) 300 MG tablet Take 1 tablet (300 mg total) by mouth daily. 09/16/22 09/16/23  Jannifer Rodney A, FNP  aspirin EC 81 MG tablet Take 1 tablet (81 mg total) by mouth daily. Swallow whole. 05/02/22   Lanier Prude, MD  Blood Glucose Monitoring Suppl (ONETOUCH VERIO REFLECT) w/Device KIT Use to test blood sugars daily as directed. DX: E11.9 10/14/19   Junie Spencer, FNP  cetirizine (ZYRTEC ALLERGY) 10 MG tablet Take 1 tablet (10 mg total) by mouth daily. Patient taking differently: Take 10 mg by mouth daily as needed for allergies. 07/18/22   Junie Spencer, FNP  clopidogrel (PLAVIX) 75 MG tablet Take 1 tablet (75 mg total) by mouth daily. 02/05/23 02/05/24  Cephus Shelling, MD  Continuous Glucose Receiver (FREESTYLE LIBRE 3 READER) DEVI 1 Device by Does not apply route every 14 (fourteen) days. 08/11/22   Junie Spencer, FNP  Continuous Glucose Sensor (FREESTYLE LIBRE 3 SENSOR) MISC 1 Device by Does not apply  route continuous. 08/11/22   Jannifer Rodney A, FNP  dutasteride (AVODART) 0.5 MG capsule Take 1 capsule (0.5 mg total) by mouth daily. 10/31/22   McKenzie, Mardene Celeste, MD  famotidine (PEPCID) 20 MG tablet Take 20 mg by mouth. 12/10/22   [provider]  fluticasone (FLONASE) 50 MCG/ACT nasal spray PLACE 1 SPRAY INTO BOTH NOSTRILS DAILY AS NEEDED FOR ALLERGIES OR RHINITIS. 07/01/22   Jannifer Rodney A, FNP  Fluticasone-Umeclidin-Vilant (TRELEGY ELLIPTA) 100-62.5-25 MCG/ACT AEPB Inhale 1 puff into the lungs daily. 12/26/22   Junie Spencer, FNP  gabapentin (NEURONTIN) 300 MG capsule TAKE 1 CAPSULE BY MOUTH THREE TIMES A DAY 12/01/22   Hawks, [name] A, FNP  insulin degludec (TRESIBA FLEXTOUCH) 200 UNIT/ML FlexTouch Pen Inject 16 Units into the skin every evening. 12/29/22   Dani Gobble, NP  Insulin Pen Needle (BD PEN NEEDLE NANO U/F) 32G X 4 MM MISC  Use to inject insulin once daily 12/29/22   Dani Gobble, NP  levothyroxine (SYNTHROID) 50 MCG tablet TAKE 1 TABLET BY MOUTH EVERY DAY 12/03/22   Junie Spencer, FNP  linaclotide [facility]) 72 MCG capsule Take 1 capsule (72 mcg total) by mouth daily before breakfast. 01/06/23   Hawks, Edilia Bo, FNP  NON FORMULARY CPAP at bedtime    [provider]  ondansetron (ZOFRAN) 4 MG tablet Take 1 tablet (4 mg total) by mouth every 8 (eight) hours as needed for nausea or vomiting. 12/26/22   Junie Spencer, FNP  OneTouch Delica Lancets 30G MISC Use to test blood sugars daily as directed. DX: E11.9 02/24/22   Dani Gobble, NP  OXYGEN Inhale 2 L into the lungs daily as needed (with Cpap at night).    [provider]  pantoprazole (PROTONIX) 40 MG tablet Take 1 tablet (40 mg total) by mouth daily. 01/06/23   Jannifer Rodney A, FNP  pravastatin (PRAVACHOL) 40 MG tablet TAKE 1 TABLET (40 MG TOTAL) BY MOUTH ON MONDAYS , WEDNESDAYS, AND FRIDAYS AS DIRECTED 10/23/22   Jannifer Rodney A, FNP  spironolactone (ALDACTONE) 25 MG tablet Take 0.5  tablets (12.5 mg total) by mouth daily. 12/30/22 03/30/23  Jodelle Gross, NP  torsemide (DEMADEX) 20 MG tablet Take 1 tablet (20 mg total) by mouth daily. Patient taking differently: Take 40 mg by mouth daily. 03/03/22   Junie Spencer, FNP  zolpidem (AMBIEN) 5 MG tablet Take 1 tablet (5 mg total) by mouth at bedtime as needed for sleep. Patient taking differently: Take 5 mg by mouth at bedtime. 01/06/23   Junie Spencer, FNP    Physical Exam: Vitals:   02/17/23 1252 02/17/23 1252 02/17/23 1634 02/17/23 1645  BP:  111/60 127/65 130/62  Pulse:  (!) 112 (!) 111 (!) 111  Resp:  18 18 14   Temp:  98.2 F (36.8 C) 98.2 F (36.8 C)   TempSrc:  Oral Oral   SpO2:  95%  90%  Weight: 70.3 kg     Height: 5\' 6"  (1.676 m)       Constitutional: NAD, calm, comfortable Vitals:   02/17/23 1252 02/17/23 1252 02/17/23 1634 02/17/23 1645  BP:  111/60 127/65 130/62  Pulse:  (!) 112 (!) 111 (!) 111  Resp:  18 18 14   Temp:  98.2 F (36.8 C) 98.2 F (36.8 C)   TempSrc:  Oral Oral   SpO2:  95%  90%  Weight: 70.3 kg     Height: 5\' 6"  (1.676 m)      Eyes: PERRL, lids and conjunctivae normal ENMT: Mucous membranes are moist.  Neck: normal, supple, no masses, no thyromegaly Respiratory: clear to auscultation bilaterally, no wheezing, no crackles. Normal respiratory effort. No accessory muscle use.  Cardiovascular: Regular rate and rhythm, no murmurs / rubs / gallops. No extremity edema.  Extremities warm.  Port-A-Cath left upper chest. Abdomen: no tenderness, full, distended, soft, at patient's baseline.  No masses palpated. No hepatosplenomegaly.   Musculoskeletal: no clubbing / cyanosis. No joint deformity upper and lower extremities.  Skin: no rashes, lesions, ulcers. No induration Neurologic: No facial asymmetry, moving extremities spontaneously,.  Speech fluent. Psychiatric: Normal judgment and insight. Alert and oriented x 3. Normal mood.   Labs on Admission: I have personally reviewed  following labs and imaging studies  CBC: Recent Labs  Lab 02/13/23 1233 02/17/23 1312  WBC 12.0* 10.6*  NEUTROABS 10.2* 8.9*  HGB 8.3* 7.3*  HCT  28.7* 25.0*  MCV 91 90.6  PLT 436 422*   Basic Metabolic Panel: Recent Labs  Lab 02/13/23 1233 02/17/23 1312  NA 137 132*  K 4.1 3.9  CL 94* 94*  CO2 22 29  GLUCOSE 216* 216*  BUN 22 27*  CREATININE 1.09 1.11  CALCIUM 8.9 8.6*   GFR: Estimated Creatinine Clearance: 51.1 mL/min (by C-G formula based on SCr of 1.11 mg/dL). Liver Function Tests: Recent Labs  Lab 02/17/23 1312  AST 13*  ALT 11  ALKPHOS 86  BILITOT 0.3  PROT 7.3  ALBUMIN 3.3*   Coagulation Profile: Recent Labs  Lab 02/17/23 1312  INR 1.0   Anemia Panel: Recent Labs    02/17/23 1312  VITAMINB12 268  FOLATE 13.0  TIBC 541*  IRON 20*  RETICCTPCT 3.0   Urine analysis:    Component Value Date/Time   COLORURINE STRAW (A) 02/17/2023 1301   APPEARANCEUR CLEAR 02/17/2023 1301   APPEARANCEUR Clear 10/31/2022 0958   LABSPEC 1.008 02/17/2023 1301   PHURINE 6.0 02/17/2023 1301   GLUCOSEU NEGATIVE 02/17/2023 1301   HGBUR NEGATIVE 02/17/2023 1301   BILIRUBINUR NEGATIVE 02/17/2023 1301   BILIRUBINUR Negative 10/31/2022 0958   KETONESUR NEGATIVE 02/17/2023 1301   PROTEINUR NEGATIVE 02/17/2023 1301   NITRITE NEGATIVE 02/17/2023 1301   LEUKOCYTESUR NEGATIVE 02/17/2023 1301    Radiological Exams on Admission: DG Chest Portable 1 View  Result Date: 02/17/2023 CLINICAL DATA:  Weakness EXAM: PORTABLE CHEST 1 VIEW COMPARISON:  Chest x-ray 08/11/2022.  Chest CT 12/16/2022. FINDINGS: Left chest port catheter tip ends in the SVC. Loop recorder device overlies the chest, unchanged. Focal opacities in the bilateral upper lobes appear unchanged from prior CT 12/16/2022. No new focal lung infiltrate, pleural effusion or pneumothorax. Cardiomediastinal silhouette appears within limits. IMPRESSION: Focal opacities in the bilateral upper lobes appear unchanged from  prior CT 12/16/2022. No new focal lung infiltrate. Electronically Signed   By: Darliss Cheney M.D.   On: 02/17/2023 16:39    EKG: Independently reviewed.  Sinus tachycardia rate 105, QTc 444.  Incomplete RBBB.  No significant change from prior.  Assessment/Plan Principal Problem:   Symptomatic anemia Active Problems:   COPD (chronic obstructive pulmonary disease) (HCC)   Chronic diastolic CHF (congestive heart failure) (HCC)   Mixed diabetic hyperlipidemia associated with type 2 diabetes mellitus (HCC)   Non-small cell lung cancer, right (HCC)   PAD (peripheral artery disease) (HCC)   Obstructive sleep apnea treated with continuous positive airway pressure (CPAP)  Assessment and Plan: * Symptomatic anemia Hemoglobin down to 7.3, downtrend from 11.2- 12 days ago.  Reports black stools, stool occult-very limited stool sample, negative for blood. Patient recently underwent right lower extremity arteriogram, angioplasty and stent of right SFA, and right external iliac artery-by Dr. Chestine Spore. Anemia panel suggestive of anemia of iron deficiency, with low iron of 20, low iron saturation, and increased TIBC.  On aspirin and Plavix. -Last Plavix dose 2 days ago 11/10. -N.p.o. midnight -Obtain CT abdomen and pelvis with contrast, rule out intra-abdominal/retroperitoneal bleed -GI consult a.m. -Hold aspirin and Plavix -IV Protonix 40 twice daily -Transfuse 1 unit PRBC for now - Trend Hgb -Resume torsemide 20 mg twice daily  Chronic diastolic CHF (congestive heart failure) (HCC) Stable and compensated.  Last echo 09/2021 EF of 50 to 60%. -Resume torsemide 20 mg BID  COPD (chronic obstructive pulmonary disease) (HCC) Stable.  Resume home regimen.  Mixed diabetic hyperlipidemia associated with type 2 diabetes mellitus (HCC) A1c 7.8. - SSI- M -  Hold Tresiba 16 units nightly, n.p.o. midnight  Non-small cell lung cancer, right (HCC) Stage IIIa lung cancer, follows with Dr. Ellin Saba.  Status post  SBRT.  PAD (peripheral artery disease) (HCC) Patient recently underwent right lower extremity arteriogram, angioplasty and stent of right SFA, and right external iliac artery-by Dr. Chestine Spore.  He did well with the procedure. -Hold aspirin and Plavix for now in the setting of acute anemia  Obstructive sleep apnea treated with continuous positive airway pressure (CPAP) Resume CPAP nightly   DVT prophylaxis: SCDs Code Status: FULL code-confirmed with patient and spouse at bedside Family Communication: Spouse at bedside Disposition Plan: ~ 2 days Consults called: GI Admission status: Obs tele  I certify that at the point of admission it is my clinical judgment that the patient will require inpatient hospital care spanning beyond 2 midnights from the point of admission due to high intensity of service, high risk for further deterioration and high frequency of surveillance required.    Author: Onnie Boer, MD 02/17/2023 6:22 PM  For on call review [email or web address].

## 2023-02-17 NOTE — Progress Notes (Signed)
Patient has refused CPAP for tonight.  Patient stated that his MD told him he no longer had sleep apnea.  He rarely uses at home.

## 2023-02-17 NOTE — Patient Instructions (Signed)
 Gastrointestinal Bleeding Gastrointestinal (GI) bleeding is bleeding somewhere along the digestive tract, between the mouth and the anus. The digestive tract includes the mouth, esophagus, stomach, small intestine, large intestine, and anus. The large intestine is often called the colon. GI bleeding can be caused by various problems. The severity of these problems can be mild, serious, or life-threatening. If you have GI bleeding, you may find blood in your stools (feces), you may have black stools, or you may vomit blood. You may need to stay in the hospital if there is a lot of bleeding. What are the causes? This condition may be caused by: Inflammation, irritation, or swelling of the esophagus (esophagitis). The esophagus is part of the body that moves food from your mouth to your stomach. Swollen veins in the rectum (hemorrhoids). Tears in the anus (anal fissures). The tears are often caused by passing hard stool. Pouches that form on the colon and may bleed (diverticulosis). Inflammation in areas with diverticulosis. This is called diverticulitis.This can cause pain, fever, and bloody stools. Growths (polyps) or cancer. Colon cancer often starts out as precancerous polyps. Gastritis and ulcers. These may cause bleeding in the upper GI tract, near the stomach. What increases the risk? You are more likely to develop this condition if: You have an infection in your stomach from a type of bacteria called Helicobacter pylori. You take certain medicines, such as: NSAIDs. Aspirin. Selective serotonin reuptake inhibitors (SSRIs). Steroids. Antiplatelet or anticoagulant medicines. You smoke. You drink alcohol. What are the signs or symptoms? Common symptoms of this condition include: Bright red blood in your vomit, or vomit that looks like coffee grounds. Bloody, black, or tarry stools. Bleeding from the lower GI tract will usually cause red or maroon blood in the stools. Bleeding from the  upper GI tract may cause black, tarry stools that are often stronger smelling than usual. In certain cases, if the bleeding is fast enough, the stools may be red. Pain or cramping in the abdomen. How is this diagnosed? This condition may be diagnosed based on: Your medical history and a physical exam. Various tests, such as: Blood tests. Stool tests. X-rays and other imaging tests. Esophagogastroduodenoscopy (EGD). In this test, a flexible, lighted tube is used to look at your esophagus, stomach, and small intestine. Colonoscopy. In this test, a flexible, lighted tube is used to look at your colon. How is this treated? Treatment for this condition depends on the cause of the bleeding. For example: For bleeding from the esophagus, stomach, small intestine, or colon, the health care provider may do a procedure to stop bleeding during your EGD or colonoscopy. Inflammation or infection of the colon can be treated with medicines. Certain rectal problems can be treated with creams, suppositories, or warm baths. Medicines may be given to reduce acid in your stomach. Surgery is sometimes done. Blood transfusions are sometimes needed if a lot of blood has been lost. If there is a lot of bleeding, you will need to stay in the hospital for observation. If bleeding is mild, you may be allowed to go home. Follow these instructions at home:  Take over-the-counter and prescription medicines only as told by your health care provider. Eat foods that are high in fiber, such as beans, whole grains, and fresh fruits and vegetables. This will help to keep your stools soft. Eating 1-3 prunes each day works well for many people. Drink enough fluid to keep your urine pale yellow. Keep all follow-up visits. This is important. Contact a  health care provider if: Your symptoms do not improve with treatment. Get help right away if: Your bleeding does not stop. You feel light-headed or you faint. You feel  weak. You have severe cramps in your back or abdomen. You pass large blood clots in your stool. Your symptoms are getting worse. You have chest pain or fast heartbeats. These symptoms may be an emergency. Get help right away. Call 911. Do not wait to see if the symptoms will go away. Do not drive yourself to the hospital. Summary Gastrointestinal (GI) bleeding is bleeding somewhere along the digestive tract, between the mouth and anus. GI bleeding can be caused by various problems. Treatment for this condition depends on the cause of the bleeding. Take over-the-counter and prescription medicines only as told by your health care provider. Get help right away if your bleeding increases, your symptoms are getting worse, or you have new symptoms. Keep all follow-up visits. This is important. This information is not intended to replace advice given to you by your health care provider. Make sure you discuss any questions you have with your health care provider. Document Revised: 10/26/2020 Document Reviewed: 10/26/2020 Elsevier Patient Education  2024 [employer].

## 2023-02-17 NOTE — Assessment & Plan Note (Signed)
Stage IIIa lung cancer, follows with Dr. Ellin Saba.  Status post SBRT.

## 2023-02-17 NOTE — Assessment & Plan Note (Signed)
Stable.  Resume home regimen 

## 2023-02-18 ENCOUNTER — Observation Stay (HOSPITAL_COMMUNITY): Payer: Medicare Other | Admitting: Certified Registered Nurse Anesthetist

## 2023-02-18 ENCOUNTER — Encounter (HOSPITAL_COMMUNITY)
Admission: EM | Disposition: A | Discharge status: Home Health | Payer: Self-pay | Source: Ambulatory Visit | Attending: Internal Medicine

## 2023-02-18 ENCOUNTER — Encounter (HOSPITAL_COMMUNITY): Payer: Self-pay | Admitting: Internal Medicine

## 2023-02-18 DIAGNOSIS — E1142 Type 2 diabetes mellitus with diabetic polyneuropathy: Secondary | ICD-10-CM | POA: Diagnosis present

## 2023-02-18 DIAGNOSIS — C3491 Malignant neoplasm of unspecified part of right bronchus or lung: Secondary | ICD-10-CM | POA: Diagnosis present

## 2023-02-18 DIAGNOSIS — K31819 Angiodysplasia of stomach and duodenum without bleeding: Secondary | ICD-10-CM | POA: Diagnosis not present

## 2023-02-18 DIAGNOSIS — D649 Anemia, unspecified: Secondary | ICD-10-CM | POA: Diagnosis not present

## 2023-02-18 DIAGNOSIS — D509 Iron deficiency anemia, unspecified: Secondary | ICD-10-CM | POA: Diagnosis not present

## 2023-02-18 DIAGNOSIS — I110 Hypertensive heart disease with heart failure: Secondary | ICD-10-CM | POA: Diagnosis present

## 2023-02-18 DIAGNOSIS — K921 Melena: Secondary | ICD-10-CM

## 2023-02-18 DIAGNOSIS — R0602 Shortness of breath: Secondary | ICD-10-CM | POA: Diagnosis not present

## 2023-02-18 DIAGNOSIS — Z95820 Peripheral vascular angioplasty status with implants and grafts: Secondary | ICD-10-CM | POA: Diagnosis not present

## 2023-02-18 DIAGNOSIS — Z794 Long term (current) use of insulin: Secondary | ICD-10-CM | POA: Diagnosis not present

## 2023-02-18 DIAGNOSIS — E1151 Type 2 diabetes mellitus with diabetic peripheral angiopathy without gangrene: Secondary | ICD-10-CM | POA: Diagnosis present

## 2023-02-18 DIAGNOSIS — D6832 Hemorrhagic disorder due to extrinsic circulating anticoagulants: Secondary | ICD-10-CM | POA: Diagnosis present

## 2023-02-18 DIAGNOSIS — Z8719 Personal history of other diseases of the digestive system: Secondary | ICD-10-CM | POA: Diagnosis not present

## 2023-02-18 DIAGNOSIS — Z79899 Other long term (current) drug therapy: Secondary | ICD-10-CM

## 2023-02-18 DIAGNOSIS — D62 Acute posthemorrhagic anemia: Secondary | ICD-10-CM | POA: Diagnosis present

## 2023-02-18 DIAGNOSIS — Z7901 Long term (current) use of anticoagulants: Secondary | ICD-10-CM | POA: Diagnosis not present

## 2023-02-18 DIAGNOSIS — R110 Nausea: Secondary | ICD-10-CM | POA: Diagnosis not present

## 2023-02-18 DIAGNOSIS — R6881 Early satiety: Secondary | ICD-10-CM | POA: Diagnosis not present

## 2023-02-18 DIAGNOSIS — J4489 Other specified chronic obstructive pulmonary disease: Secondary | ICD-10-CM | POA: Diagnosis present

## 2023-02-18 DIAGNOSIS — I5032 Chronic diastolic (congestive) heart failure: Secondary | ICD-10-CM | POA: Diagnosis present

## 2023-02-18 DIAGNOSIS — R5383 Other fatigue: Secondary | ICD-10-CM

## 2023-02-18 DIAGNOSIS — I70201 Unspecified atherosclerosis of native arteries of extremities, right leg: Secondary | ICD-10-CM | POA: Diagnosis present

## 2023-02-18 DIAGNOSIS — E1169 Type 2 diabetes mellitus with other specified complication: Secondary | ICD-10-CM | POA: Diagnosis present

## 2023-02-18 DIAGNOSIS — F32A Depression, unspecified: Secondary | ICD-10-CM | POA: Diagnosis present

## 2023-02-18 DIAGNOSIS — J449 Chronic obstructive pulmonary disease, unspecified: Secondary | ICD-10-CM | POA: Diagnosis not present

## 2023-02-18 DIAGNOSIS — F17200 Nicotine dependence, unspecified, uncomplicated: Secondary | ICD-10-CM | POA: Diagnosis not present

## 2023-02-18 DIAGNOSIS — D500 Iron deficiency anemia secondary to blood loss (chronic): Secondary | ICD-10-CM | POA: Diagnosis not present

## 2023-02-18 DIAGNOSIS — E119 Type 2 diabetes mellitus without complications: Secondary | ICD-10-CM | POA: Diagnosis not present

## 2023-02-18 DIAGNOSIS — E039 Hypothyroidism, unspecified: Secondary | ICD-10-CM | POA: Diagnosis present

## 2023-02-18 DIAGNOSIS — H3530 Unspecified macular degeneration: Secondary | ICD-10-CM | POA: Diagnosis present

## 2023-02-18 DIAGNOSIS — N400 Enlarged prostate without lower urinary tract symptoms: Secondary | ICD-10-CM | POA: Diagnosis present

## 2023-02-18 DIAGNOSIS — R1013 Epigastric pain: Secondary | ICD-10-CM

## 2023-02-18 DIAGNOSIS — J9611 Chronic respiratory failure with hypoxia: Secondary | ICD-10-CM | POA: Diagnosis present

## 2023-02-18 DIAGNOSIS — K5520 Angiodysplasia of colon without hemorrhage: Secondary | ICD-10-CM | POA: Diagnosis present

## 2023-02-18 DIAGNOSIS — G4733 Obstructive sleep apnea (adult) (pediatric): Secondary | ICD-10-CM | POA: Diagnosis present

## 2023-02-18 DIAGNOSIS — K31811 Angiodysplasia of stomach and duodenum with bleeding: Secondary | ICD-10-CM | POA: Diagnosis present

## 2023-02-18 DIAGNOSIS — E782 Mixed hyperlipidemia: Secondary | ICD-10-CM | POA: Diagnosis present

## 2023-02-18 HISTORY — PX: ENTEROSCOPY: SHX5533

## 2023-02-18 HISTORY — PX: ESOPHAGOGASTRODUODENOSCOPY (EGD) WITH PROPOFOL: SHX5813

## 2023-02-18 HISTORY — PX: HOT HEMOSTASIS: SHX5433

## 2023-02-18 LAB — CBC
HCT: 27.7 % — ABNORMAL LOW (ref 39.0–52.0)
Hemoglobin: 8 g/dL — ABNORMAL LOW (ref 13.0–17.0)
MCH: 25.7 pg — ABNORMAL LOW (ref 26.0–34.0)
MCHC: 28.9 g/dL — ABNORMAL LOW (ref 30.0–36.0)
MCV: 89.1 fL (ref 80.0–100.0)
Platelets: 444 10*3/uL — ABNORMAL HIGH (ref 150–400)
RBC: 3.11 MIL/uL — ABNORMAL LOW (ref 4.22–5.81)
RDW: 16 % — ABNORMAL HIGH (ref 11.5–15.5)
WBC: 8.9 10*3/uL (ref 4.0–10.5)
nRBC: 0 % (ref 0.0–0.2)

## 2023-02-18 LAB — GLUCOSE, CAPILLARY
Glucose-Capillary: 112 mg/dL — ABNORMAL HIGH (ref 70–99)
Glucose-Capillary: 123 mg/dL — ABNORMAL HIGH (ref 70–99)
Glucose-Capillary: 125 mg/dL — ABNORMAL HIGH (ref 70–99)
Glucose-Capillary: 131 mg/dL — ABNORMAL HIGH (ref 70–99)
Glucose-Capillary: 134 mg/dL — ABNORMAL HIGH (ref 70–99)
Glucose-Capillary: 181 mg/dL — ABNORMAL HIGH (ref 70–99)

## 2023-02-18 LAB — BASIC METABOLIC PANEL
Anion gap: 5 (ref 5–15)
BUN: 24 mg/dL — ABNORMAL HIGH (ref 8–23)
CO2: 30 mmol/L (ref 22–32)
Calcium: 8.4 mg/dL — ABNORMAL LOW (ref 8.9–10.3)
Chloride: 98 mmol/L (ref 98–111)
Creatinine, Ser: 1 mg/dL (ref 0.61–1.24)
GFR, Estimated: >60 mL/min (ref >60)
Glucose, Bld: 111 mg/dL — ABNORMAL HIGH (ref 70–99)
Potassium: 3.6 mmol/L (ref 3.5–5.1)
Sodium: 133 mmol/L — ABNORMAL LOW (ref 135–145)

## 2023-02-18 SURGERY — ESOPHAGOGASTRODUODENOSCOPY (EGD) WITH PROPOFOL
Anesthesia: General

## 2023-02-18 MED ORDER — PROPOFOL 500 MG/50ML IV EMUL
INTRAVENOUS | Status: DC | PRN
  Administered 2023-02-18: 50 ug/kg/min via INTRAVENOUS

## 2023-02-18 MED ORDER — LACTATED RINGERS IV SOLN: INTRAVENOUS | Status: DC | PRN

## 2023-02-18 MED ORDER — LIDOCAINE HCL (PF) 2 % IJ SOLN
INTRAMUSCULAR | Status: AC
  Filled 2023-02-18: qty 5

## 2023-02-18 MED ORDER — PHENYLEPHRINE HCL (PRESSORS) 10 MG/ML IV SOLN
INTRAVENOUS | Status: DC | PRN
  Administered 2023-02-18 (×2): 160 ug via INTRAVENOUS

## 2023-02-18 MED ORDER — CHLORHEXIDINE GLUCONATE CLOTH 2 % EX PADS
6.0000 | MEDICATED_PAD | Freq: Every day | CUTANEOUS | Status: DC
  Administered 2023-02-18 – 2023-02-19 (×2): 6 via TOPICAL

## 2023-02-18 MED ORDER — PROPOFOL 500 MG/50ML IV EMUL
INTRAVENOUS | Status: AC
  Filled 2023-02-18: qty 50

## 2023-02-18 NOTE — Progress Notes (Signed)
PROGRESS NOTE    John [name]  QIH:[id number] DOB: Jun 08, 1945 DOA: 02/17/2023 PCP: Junie Spencer, FNP   Brief Narrative:    John [name] is a 77 y.o. male with medical history significant for peripheral artery disease, OSA on CPAP, diabetes mellitus, hypertension, COPD, diastolic CHF, lung cancer. Patient presented to the ED with complaints of difficulty breathing with exertion and generalized fatigue, over the past 2 weeks at least. Patient recently underwent right lower extremity arteriogram, angioplasty and stent of right SFA, and right external iliac artery-by Dr. Chestine Spore.  He did well with the procedure. He is unsure if his symptoms started before or after this procedure.  Spouse reports she noticed patient's changing color and becoming more pale after the procedure.  No abdominal pain. Patient also reports over the past 2 weeks has had black stools.  No vomiting.  No gross blood in stools. He is on aspirin and Plavix.  His last dose of Plavix was 2 days ago-02/15/2023.  Patient was admitted for symptomatic anemia and has received 1 unit PRBC transfusion with plans for EGD per GI today.  Assessment & Plan:   Principal Problem:   Symptomatic anemia Active Problems:   COPD (chronic obstructive pulmonary disease) (HCC)   Chronic diastolic CHF (congestive heart failure) (HCC)   Mixed diabetic hyperlipidemia associated with type 2 diabetes mellitus (HCC)   Non-small cell lung cancer, right (HCC)   PAD (peripheral artery disease) (HCC)   Obstructive sleep apnea treated with continuous positive airway pressure (CPAP)  Assessment and Plan:    Symptomatic anemia status post 1 unit PRBC transfusion -Continue to hold aspirin and Plavix -Appreciate GI with plans for EGD -Continue to monitor H/H   Chronic diastolic CHF (congestive heart failure) (HCC) Stable and compensated.  Last echo 09/2021 EF of 50 to 60%. -Hold spironolactone and torsemide while n.p.o. and then resume  once diet has been initiated   COPD (chronic obstructive pulmonary disease) (HCC) Stable.  Resume home regimen.   Mixed diabetic hyperlipidemia associated with type 2 diabetes mellitus (HCC) A1c 7.8. - SSI- M -Hold Tresiba 16 units nightly, n.p.o. midnight   Non-small cell lung cancer, right (HCC) Stage IIIa lung cancer, follows with Dr. Ellin Saba.  Status post SBRT.   PAD (peripheral artery disease) (HCC) Patient recently underwent right lower extremity arteriogram, angioplasty and stent of right SFA, and right external iliac artery-by Dr. Chestine Spore.  He did well with the procedure. -Hold aspirin and Plavix for now in the setting of acute anemia   Obstructive sleep apnea treated with continuous positive airway pressure (CPAP) Resume CPAP nightly    DVT prophylaxis:SCDs Code Status: Full Family Communication: None at bedside Disposition Plan: Endoscopy evaluation pending Status is: Inpatient Remains inpatient appropriate because: Need for inpatient evaluation    Consultants:  GI  Procedures:  None  Antimicrobials:  None   Subjective: Patient seen and evaluated today with no new acute complaints or concerns. No acute concerns or events noted overnight.  Objective: Vitals:   02/17/23 1816 02/17/23 2025 02/17/23 2353 02/18/23 0505  BP: 116/73 113/62 (!) 120/57 106/62  Pulse: (!) 112 (!) 111 (!) 125 (!) 107  Resp: 18 18 20 18   Temp: 97.8 F (36.6 C) (!) 97.4 F (36.3 C)  98.1 F (36.7 C)  TempSrc: Oral Oral    SpO2: 94% 97% 94% 100%  Weight:      Height:        Intake/Output Summary (Last 24 hours) at 02/18/2023 0731 Last data  filed at 02/18/2023 0300 Gross per 24 hour  Intake 120 ml  Output --  Net 120 ml   Filed Weights   02/17/23 1252  Weight: 70.3 kg    Examination:  General exam: Appears calm and comfortable  Respiratory system: Clear to auscultation. Respiratory effort normal. Cardiovascular system: S1 & S2 heard, RRR.  Gastrointestinal  system: Abdomen is soft Central nervous system: Alert and awake Extremities: No edema Skin: No significant lesions noted Psychiatry: Flat affect.    Data Reviewed: I have personally reviewed following labs and imaging studies  CBC: Recent Labs  Lab 02/13/23 1233 02/17/23 1049 02/17/23 1312 02/17/23 1941 02/18/23 0447  WBC 12.0* 10.9* 10.6*  --  8.9  NEUTROABS 10.2* 9.3* 8.9*  --   --   HGB 8.3* 7.1* 7.3* 8.7* 8.0*  HCT 28.7* 24.3* 25.0* 29.4* 27.7*  MCV 91 91 90.6  --  89.1  PLT 436 444 422*  --  444*   Basic Metabolic Panel: Recent Labs  Lab 02/13/23 1233 02/17/23 1312 02/18/23 0447  NA 137 132* 133*  K 4.1 3.9 3.6  CL 94* 94* 98  CO2 22 29 30   GLUCOSE 216* 216* 111*  BUN 22 27* 24*  CREATININE 1.09 1.11 1.00  CALCIUM 8.9 8.6* 8.4*   GFR: Estimated Creatinine Clearance: 56.7 mL/min (by C-G formula based on SCr of 1 mg/dL). Liver Function Tests: Recent Labs  Lab 02/17/23 1312  AST 13*  ALT 11  ALKPHOS 86  BILITOT 0.3  PROT 7.3  ALBUMIN 3.3*   No results for input(s): "LIPASE", "AMYLASE" in the last 168 hours. No results for input(s): "AMMONIA" in the last 168 hours. Coagulation Profile: Recent Labs  Lab 02/17/23 1312  INR 1.0   Cardiac Enzymes: No results for input(s): "CKTOTAL", "CKMB", "CKMBINDEX", "TROPONINI" in the last 168 hours. BNP (last 3 results) No results for input(s): "PROBNP" in the last 8760 hours. HbA1C: No results for input(s): "HGBA1C" in the last 72 hours. CBG: Recent Labs  Lab 02/18/23 0505  GLUCAP 123*   Lipid Profile: No results for input(s): "CHOL", "HDL", "LDLCALC", "TRIG", "CHOLHDL", "LDLDIRECT" in the last 72 hours. Thyroid Function Tests: No results for input(s): "TSH", "T4TOTAL", "FREET4", "T3FREE", "THYROIDAB" in the last 72 hours. Anemia Panel: Recent Labs    02/17/23 1312  VITAMINB12 268  FOLATE 13.0  FERRITIN 10*  TIBC 541*  IRON 20*  RETICCTPCT 3.0   Sepsis Labs: No results for input(s):  "PROCALCITON", "LATICACIDVEN" in the last 168 hours.  No results found for this or any previous visit (from the past 240 hour(s)).       Radiology Studies: CT ABDOMEN PELVIS W CONTRAST  Result Date: 02/18/2023 CLINICAL DATA:  S/p angioplasty and stent of right SFA, and right external iliac artery- 10/31. New acute anemia. EXAM: CT ABDOMEN AND PELVIS WITH CONTRAST TECHNIQUE: Multidetector CT imaging of the abdomen and pelvis was performed using the standard protocol following bolus administration of intravenous contrast. RADIATION DOSE REDUCTION: This exam was performed according to the departmental dose-optimization program which includes automated exposure control, adjustment of the mA and/or kV according to patient size and/or use of iterative reconstruction technique. CONTRAST:  OMNIPAQUE IOHEXOL 300 MG/ML SOLN, 30mL OMNIPAQUE IOHEXOL 300 MG/ML SOLN COMPARISON:  Head CT 05/15/2022 FINDINGS: Lower chest: Coronary calcification. Mitral annular calcification. No acute abnormality. Hepatobiliary: No focal liver abnormality. Calcified gallstone noted within the gallbladder lumen. No gallbladder wall thickening or pericholecystic fluid. No biliary dilatation. Pancreas: No focal lesion. Normal pancreatic contour.  No surrounding inflammatory changes. No main pancreatic ductal dilatation. Spleen: Normal in size without focal abnormality. Adrenals/Urinary Tract: No adrenal nodule bilaterally. Bilateral kidneys enhance symmetrically. Fluid density lesion within the right kidney likely represents a simple renal cyst. Simple renal cysts, in the absence of clinically indicated signs/symptoms, require no independent follow-up. No hydronephrosis. No hydroureter. The urinary bladder is unremarkable. TURP procedure noted. Urachal diverticula noted. On delayed imaging, there is no urothelial wall thickening and there are no filling defects in the opacified portions of the bilateral collecting systems or ureters.  Stomach/Bowel: Stomach is within normal limits. No evidence of bowel wall thickening or dilatation. Colonic diverticulosis. Appendix appears normal. Vascular/Lymphatic: Patent right common and external iliac stents (58, 4:63). No abdominal aorta or iliac aneurysm. Severe atherosclerotic plaque of the aorta and its branches. No abdominal, pelvic, or inguinal lymphadenopathy. Reproductive: Prostate is unremarkable. Other: No intraperitoneal free fluid. No intraperitoneal free gas. No organized fluid collection. Musculoskeletal: Tiny fat containing umbilical hernia. No suspicious lytic or blastic osseous lesions. No acute displaced fracture. Multilevel degenerative changes of the spine. Multilevel posterior disc osteophyte complex formation. IMPRESSION: 1. No acute intra-abdominal or intrapelvic abnormality. 2. Cholelithiasis with no acute cholecystitis. 3. Colonic diverticulosis with no acute diverticulitis. 4. Aortic Atherosclerosis (ICD10-I70.0) including coronary artery and mitral annular calcifications. Patent right common and external iliac stents Electronically Signed   By: Tish Frederickson M.D.   On: 02/18/2023 01:52   DG Chest Portable 1 View  Result Date: 02/17/2023 CLINICAL DATA:  Weakness EXAM: PORTABLE CHEST 1 VIEW COMPARISON:  Chest x-ray 08/11/2022.  Chest CT 12/16/2022. FINDINGS: Left chest port catheter tip ends in the SVC. Loop recorder device overlies the chest, unchanged. Focal opacities in the bilateral upper lobes appear unchanged from prior CT 12/16/2022. No new focal lung infiltrate, pleural effusion or pneumothorax. Cardiomediastinal silhouette appears within limits. IMPRESSION: Focal opacities in the bilateral upper lobes appear unchanged from prior CT 12/16/2022. No new focal lung infiltrate. Electronically Signed   By: Darliss Cheney M.D.   On: 02/17/2023 16:39        Scheduled Meds:  allopurinol  300 mg Oral Daily   fluticasone furoate-vilanterol  1 puff Inhalation Daily   And    umeclidinium bromide  1 puff Inhalation Daily   gabapentin  300 mg Oral TID   insulin aspart  0-15 Units Subcutaneous Q6H   levothyroxine  50 mcg Oral Daily   linaclotide  72 mcg Oral QAC breakfast   pantoprazole  40 mg Intravenous Q12H     LOS: 0 days    Time spent: 55 minutes    Yazmina Pareja Hoover Brunette, DO [facility]  If 7PM-7AM, please contact night-coverage [email or web address] 02/18/2023, 7:31 AM

## 2023-02-18 NOTE — Plan of Care (Signed)

## 2023-02-18 NOTE — Anesthesia Postprocedure Evaluation (Signed)
Anesthesia Post Note  Patient: CYRIC WILFERT  Procedure(s) Performed: ESOPHAGOGASTRODUODENOSCOPY (EGD) WITH PROPOFOL ENTEROSCOPY HOT HEMOSTASIS (ARGON PLASMA COAGULATION/BICAP)  Patient location during evaluation: PACU Anesthesia Type: General Level of consciousness: awake and alert Pain management: pain level controlled Vital Signs Assessment: post-procedure vital signs reviewed and stable Respiratory status: spontaneous breathing, nonlabored ventilation, respiratory function stable and patient connected to nasal cannula oxygen Cardiovascular status: blood pressure returned to baseline and stable Postop Assessment: no apparent nausea or vomiting Anesthetic complications: no   There were no known notable events for this encounter.   Last Vitals:  Vitals:   02/18/23 1500 02/18/23 1515  BP: (!) 106/57 90/61  Pulse: (!) 103 (!) 105  Resp: 20 (!) 21  Temp:    SpO2: 94% 95%    Last Pain:  Vitals:   02/18/23 1515  TempSrc:   PainSc: 0-No pain                 Renai Lopata L Shanora Christensen

## 2023-02-18 NOTE — Op Note (Addendum)
[facility] Patient Name: John [name] Procedure Date: 02/18/2023 1:56 PM MRN: [id number] Date of Birth: 08-09-1945 Attending MD: Gennette Pac , MD, [id number] CSN: [id number] Age: 77 Admit Type: Outpatient Procedure:                Small bowel enteroscopy Indications:              Melena Providers:                Gennette Pac, MD, Nena Polio, RN, Zena Amos Referring MD:              Medicines:                Propofol per Anesthesia Complications:            No immediate complications. Estimated Blood Loss:     Estimated blood loss: none. Procedure:                Pre-Anesthesia Assessment:                           - Prior to the procedure, a History and Physical                            was performed, and patient medications and                            allergies were reviewed. The patient's tolerance of                            previous anesthesia was also reviewed. The risks                            and benefits of the procedure and the sedation                            options and risks were discussed with the patient.                            All questions were answered, and informed consent                            was obtained. Prior Anticoagulants: The patient has                            taken no anticoagulant or antiplatelet agents. ASA                            Grade Assessment: III - A patient with severe                            systemic disease. After reviewing the risks and  benefits, the patient was deemed in satisfactory                            condition to undergo the procedure.                           After obtaining informed consent, the endoscope was                            passed under direct vision. Throughout the                            procedure, the patient's blood pressure, pulse, and                            oxygen saturations were monitored  continuously. The                            [id number]-[id number] ([id number]) scope was introduced through                            the mouth and advanced to the proximal jejunum. The                            small bowel enteroscopy was accomplished without                            difficulty. The patient tolerated the procedure                            well. Scope In: 2:41:10 PM Scope Out: 2:52:26 PM Total Procedure Duration: 0 hours 11 minutes 16 seconds  Findings:      Esophagus normal. (2) tiny AVMs in the cardia. Innocent appearing. The       remainder of the gastric mucosa. Normal patent pylorus.      Utilizing the pediatric colonoscope, the small bowel was surveyed down       to what I feel was the proximal jejunum. From this level scope was       slowly and cautiously withdrawn; I did not see any abnormal mucosa back       up to the pyloric channel. Small bowel mucosa appeared entirely normal.       There was no blood in the upper GI tract or small bowel.      Utilizing the APC linear probe, the 2 AVMs in the cardia resealed with       applications at 20 J each. This was done successfully without apparent       complication. Impression:               - 2 innocent appearing gastric cardia AVM - status                            post APC ablation. Remainder of esophagus, stomach                            and  small bowel to the proximal jejunum appeared                            normal.                           - Findings would not adequately explain recurrent                            GI bleeding . Entire small bowel needs evaluation.. Moderate Sedation:      Moderate (conscious) sedation was personally administered by an       anesthesia professional. The following parameters were monitored: oxygen       saturation, heart rate, blood pressure, respiratory rate, EKG, adequacy       of pulmonary ventilation, and response to care. Recommendation:           - Clear liquid diet.  N.p.o. past midnight tonight.                            Given capsule study of the small bowel tomorrow. I                            discussed this procedure with the patient and                            significant other, Sissy Hoff in short stay prior                            to today's procedure. All parties agreeable. Procedure Code(s):        --- Professional ---                           [phone], Small intestinal endoscopy, enteroscopy                            beyond second portion of duodenum, not including                            ileum; diagnostic, including collection of                            specimen(s) by brushing or washing, when performed                            (separate procedure) Diagnosis Code(s):        --- Professional ---                           K92.1, Melena (includes Hematochezia) CPT copyright 2022 [employer] Association. All rights reserved. The codes documented in this report are preliminary and upon coder review may  be revised to meet current compliance requirements. Gerrit Friends. Anhelica Fowers, MD Gennette Pac, MD 02/18/2023 3:24:58 PM This report has been signed electronically. Number of Addenda: 0

## 2023-02-18 NOTE — Anesthesia Preprocedure Evaluation (Addendum)
Anesthesia Evaluation  Patient identified by MRN, date of birth, ID band Patient awake    Reviewed: Allergy & Precautions, NPO status , Patient's Chart, lab work & pertinent test results  History of Anesthesia Complications Negative for: history of anesthetic complications  Airway Mallampati: II  TM Distance: >3 FB Neck ROM: Full    Dental  (+) Edentulous Upper, Edentulous Lower   Pulmonary shortness of breath and with exertion, asthma , sleep apnea , COPD,  COPD inhaler, Current Smoker and Patient abstained from smoking. Non-small cell lung cancer, right (HCC) 04/18/2016    Pulmonary exam normal breath sounds clear to auscultation       Cardiovascular Exercise Tolerance: Poor hypertension, Pt. on medications + Peripheral Vascular Disease and +CHF  Normal cardiovascular exam+ dysrhythmias Atrial Fibrillation  Rhythm:Regular Rate:Normal  Chronic diastolic heart failure   Neuro/Psych  PSYCHIATRIC DISORDERS Anxiety Depression    negative neurological ROS     GI/Hepatic Neg liver ROS, PUD,GERD  Medicated and Controlled,,  Endo/Other  diabetes, Well Controlled, Type 2, Oral Hypoglycemic AgentsHypothyroidism    Renal/GU negative Renal ROS  negative genitourinary   Musculoskeletal  (+) Arthritis , Osteoarthritis,    Abdominal Normal abdominal exam  (+)   Peds negative pediatric ROS (+)  Hematology  (+) Blood dyscrasia, anemia   Anesthesia Other Findings   Reproductive/Obstetrics negative OB ROS                             Anesthesia Physical Anesthesia Plan  ASA: 4  Anesthesia Plan: General   Post-op Pain Management: Minimal or no pain anticipated   Induction: Intravenous  PONV Risk Score and Plan: Propofol infusion  Airway Management Planned: Nasal Cannula and Natural Airway  Additional Equipment: None  Intra-op Plan:   Post-operative Plan:   Informed Consent: I have reviewed  the patients History and Physical, chart, labs and discussed the procedure including the risks, benefits and alternatives for the proposed anesthesia with the patient or authorized representative who has indicated his/her understanding and acceptance.       Plan Discussed with: CRNA  Anesthesia Plan Comments:         Anesthesia Quick Evaluation

## 2023-02-18 NOTE — Progress Notes (Signed)
Pt reports he lives with his friend. He is active with [facility] HH[facility]. Morrie Sheldon with [facility] notified of admission and will follow.    02/18/23 0851  TOC Brief Assessment  Insurance and Status Reviewed  Patient has primary care physician Yes  Home environment has been reviewed Lives with friend.  Prior level of function: Fairly independent.  Prior/Current Home Services Current home services [facility].)  Social Determinants of Health Reivew SDOH reviewed no interventions necessary  Readmission risk has been reviewed Yes  Transition of care needs no transition of care needs at this time

## 2023-02-18 NOTE — Transfer of Care (Signed)
Immediate Anesthesia Transfer of Care Note  Patient: John [name]  Procedure(s) Performed: ESOPHAGOGASTRODUODENOSCOPY (EGD) WITH PROPOFOL ENTEROSCOPY HOT HEMOSTASIS (ARGON PLASMA COAGULATION/BICAP)  Patient Location: PACU  Anesthesia Type:General  Level of Consciousness: awake and patient cooperative  Airway & Oxygen Therapy: Patient connected to face mask  Post-op Assessment: Report given to RN, Post -op Vital signs reviewed and stable, and Patient moving all extremities X 4  Post vital signs: Reviewed and stable  Last Vitals:  Vitals Value Taken Time  BP 106/57 02/18/23 1500  Temp 36.4 C 02/18/23 1455  Pulse 107 02/18/23 1501  Resp 23 02/18/23 1501  SpO2 95 % 02/18/23 1501  Vitals shown include unfiled device data.  Last Pain:  Vitals:   02/18/23 1324  TempSrc:   PainSc: 0-No pain      Patients Stated Pain Goal: 5 (02/18/23 1324)  Complications: No notable events documented.

## 2023-02-18 NOTE — Consult Note (Signed)
Gastroenterology Consult   Referring Provider: No ref. provider found Primary Care Physician:  John Spencer, FNP Primary Gastroenterologist:  Dr. Yancey Flemings   Patient ID: John [name]; [id number]; October 27, 1945   Admit date: 02/17/2023  LOS: 0 days   Date of Consultation: 02/18/2023  Reason for Consultation:  anemia and melena   History of Present Illness   John [name] is a 77 y.o. year old male with multiple significant medical problems including non-small cell lung cancer (chemo/radiation 2018, additional radiation in June 2022), COPD, sleep apnea, chronic hypoxic respiratory failure with as needed O2 use, diabetes mellitus, hypothyroidism, PAD with femoral endarterectomy and common iliac stenting, A-fib on Eliquis, gastric ulcer history of GI bleeding presenting to the ED due to melena and fatigue.  ED course: Hemoglobin 7.3 ferritin 10, iron 20, TIBC 541, saturation 4% B12 268 FOBT negative  Consult History provided by both patient and his wife who is at bedside during this encounter. wife states patient has been feeling fatigued, sob and pale for the past few weeks. Also with black stools over that course of time. Denies BRBPR. Patient also having some feelings of unsettled stomach and not eating well due to this. Appears to be on protonix 40mg  daily as outpatient and states compliance with this. They deny any other NSAIDs besides his daily baby aspirin. Was already on ASA but just started on plavix after abdominal aortogram on 10/31. Wife is unsure if symptoms began before or after his procedure, however, patient thinks this started after.  He has issues with constipation as well. He takes linzess as outpatient which does not always work that well, usually gives him diarrhea when he does have a BM.    Last EGD: 10/2021- Normal esophagus.                           - Six non-bleeding angiodysplastic lesions in the                            stomach. Treated with  argon plasma coagulation                            (APC).                           - Four non-bleeding angiodysplastic lesions in the                            duodenum. Treated with argon plasma coagulation                            (APC).                           - Two non-bleeding angiodysplastic lesions in the                            jejunum. Treated with argon plasma coagulation                            (APC).                           -  No specimens collected. Last Colonoscopy: 06/2021- A single non-bleeding colonic angiodysplastic                            lesion. This can cause Hemoccult positive                            stool/positive Cologuard.                           - Diverticulosis in the entire examined colon.                           - The examination was otherwise normal on direct                            and retroflexion views.                           - No specimens collected.   Past Medical History:  Diagnosis Date   Adenocarcinoma of lung, right (HCC) 04/18/2016   Anemia    Anxiety    Arthritis    Asthma    Atrial flutter (HCC)    BPH (benign prostatic hyperplasia)    with urinary retention 02/06/20   CHF (congestive heart failure) (HCC)    COPD (chronic obstructive pulmonary disease) (HCC)    Depression    Diabetes mellitus, type II (HCC)    Dyspnea    History of kidney stones    History of radiation therapy    right lung 09/10/2020-09/17/2020   Dr Roselind Messier   History of radiation therapy    Left and right Lung- 06/19/22-06/26/22   Hyperlipidemia    Hypertension    Hypothyroidism    Macular degeneration    Neuropathy    Non-small cell lung cancer, right (HCC) 04/18/2016   Peripheral vascular disease (HCC)    Prostatitis    Pulmonary nodule, left 07/16/2016   Sleep apnea    cpap    Past Surgical History:  Procedure Laterality Date   A-FLUTTER ABLATION N/A 01/13/2022   Procedure: A-FLUTTER ABLATION;  Surgeon: Lanier Prude, MD;   Location: [facility];  Service: Cardiovascular;  Laterality: N/A;   ABDOMINAL AORTOGRAM W/LOWER EXTREMITY Left 02/06/2020   Procedure: ABDOMINAL AORTOGRAM W/LOWER EXTREMITY;  Surgeon: Runell Gess, MD;  Location: [facility];  Service: Cardiovascular;  Laterality: Left;   ABDOMINAL AORTOGRAM W/LOWER EXTREMITY N/A 05/30/2021   Procedure: ABDOMINAL AORTOGRAM W/LOWER EXTREMITY;  Surgeon: Cephus Shelling, MD;  Location: [facility];  Service: Cardiovascular;  Laterality: N/A;   ABDOMINAL AORTOGRAM W/LOWER EXTREMITY N/A 02/05/2023   Procedure: ABDOMINAL AORTOGRAM W/LOWER EXTREMITY;  Surgeon: Cephus Shelling, MD;  Location: [facility];  Service: Cardiovascular;  Laterality: N/A;   BIOPSY  09/16/2021   Procedure: BIOPSY;  Surgeon: Iva Boop, MD;  Location: [facility] ENDOSCOPY;  Service: Gastroenterology;;   CARDIOVERSION N/A 10/05/2020   Procedure: CARDIOVERSION;  Surgeon: Sande Rives, MD;  Location: [facility] ENDOSCOPY;  Service: Cardiovascular;  Laterality: N/A;   CATARACT EXTRACTION, BILATERAL Bilateral    COLONOSCOPY WITH PROPOFOL N/A 06/20/2021   Procedure: COLONOSCOPY WITH PROPOFOL;  Surgeon: Hilarie Fredrickson, MD;  Location: [facility] ENDOSCOPY;  Service: Endoscopy;  Laterality: N/A;  COLONOSCOPY WITH PROPOFOL N/A 09/16/2021   Procedure: COLONOSCOPY WITH PROPOFOL;  Surgeon: Iva Boop, MD;  Location: [facility] ENDOSCOPY;  Service: Gastroenterology;  Laterality: N/A;   ENDARTERECTOMY FEMORAL Right 08/20/2020   Procedure: ENDARTERECTOMY  RIGHT FEMORAL ARTERY;  Surgeon: Cephus Shelling, MD;  Location: [facility] OR;  Service: Vascular;  Laterality: Right;   ENTEROSCOPY N/A 10/11/2021   Procedure: ENTEROSCOPY;  Surgeon: Dolores Frame, MD;  Location: [facility];  Service: Gastroenterology;  Laterality: N/A;   ESOPHAGOGASTRODUODENOSCOPY N/A 09/16/2021   Procedure: ESOPHAGOGASTRODUODENOSCOPY (EGD);  Surgeon: Iva Boop, MD;  Location: [facility] ENDOSCOPY;  Service:  Gastroenterology;  Laterality: N/A;   ESOPHAGOGASTRODUODENOSCOPY (EGD) WITH PROPOFOL N/A 09/06/2020   Procedure: ESOPHAGOGASTRODUODENOSCOPY (EGD) WITH PROPOFOL;  Surgeon: Hilarie Fredrickson, MD;  Location: [facility] ENDOSCOPY;  Service: Endoscopy;  Laterality: N/A;   ESOPHAGOGASTRODUODENOSCOPY (EGD) WITH PROPOFOL N/A 10/11/2021   Procedure: ESOPHAGOGASTRODUODENOSCOPY (EGD) WITH PROPOFOL;  Surgeon: Dolores Frame, MD;  [facility]: [facility];  Service: Gastroenterology;  Laterality: N/A;   HEMOSTASIS CLIP PLACEMENT  09/16/2021   Procedure: HEMOSTASIS CLIP PLACEMENT;  Surgeon: Iva Boop, MD;  Location: [facility] ENDOSCOPY;  Service: Gastroenterology;;   HOT HEMOSTASIS N/A 09/16/2021   Procedure: HOT HEMOSTASIS (ARGON PLASMA COAGULATION/BICAP);  Surgeon: Iva Boop, MD;  Location: [facility] ENDOSCOPY;  Service: Gastroenterology;  Laterality: N/A;   HOT HEMOSTASIS  10/11/2021   Procedure: HOT HEMOSTASIS (ARGON PLASMA COAGULATION/BICAP);  Surgeon: Marguerita Merles, Reuel Boom, MD;  Location: [facility];  Service: Gastroenterology;;   INSERTION OF ILIAC STENT Right 08/20/2020   Procedure: RETROGRADE INSERTION OF RIGHT ILIAC STENT;  Surgeon: Cephus Shelling, MD;  Location: [facility] OR;  Service: Vascular;  Laterality: Right;   INTRAOPERATIVE ARTERIOGRAM Right 08/20/2020   Procedure: INTRA OPERATIVE ARTERIOGRAM ILIAC;  Surgeon: Cephus Shelling, MD;  Location: [facility] OR;  Service: Vascular;  Laterality: Right;   PATCH ANGIOPLASTY Right 08/20/2020   Procedure: PATCH ANGIOPLASTY RIGHT FEMORAL ARTERY;  Surgeon: Cephus Shelling, MD;  Location: [facility] OR;  Service: Vascular;  Laterality: Right;   PERIPHERAL VASCULAR BALLOON ANGIOPLASTY Right 05/30/2021   Procedure: PERIPHERAL VASCULAR BALLOON ANGIOPLASTY;  Surgeon: Cephus Shelling, MD;  [facility];  Service: Cardiovascular;  Laterality: Right;   PERIPHERAL VASCULAR INTERVENTION Right 02/05/2023   Procedure: PERIPHERAL VASCULAR INTERVENTION;  Surgeon:  Cephus Shelling, MD;  [facility]: [facility];  Service: Cardiovascular;  Laterality: Right;  Rt SFA and Rt Ext Iliac   PORTACATH PLACEMENT Left 06/13/2016   Procedure: INSERTION PORT-A-CATH;  Surgeon: Franky Macho, MD;  Location: [facility]S;  Service: General;  Laterality: Left;   TRANSURETHRAL RESECTION OF PROSTATE N/A 05/31/2020   Procedure: TRANSURETHRAL RESECTION OF THE PROSTATE (TURP);  Surgeon: Malen Gauze, MD;  Location: [facility];  Service: Urology;  Laterality: N/A;   VIDEO BRONCHOSCOPY WITH ENDOBRONCHIAL NAVIGATION N/A 05/28/2016   Procedure: VIDEO BRONCHOSCOPY WITH ENDOBRONCHIAL NAVIGATION;  Surgeon: Loreli Slot, MD;  Location: [facility] OR;  Service: Thoracic;  Laterality: N/A;   VIDEO BRONCHOSCOPY WITH ENDOBRONCHIAL ULTRASOUND N/A 05/28/2016   Procedure: VIDEO BRONCHOSCOPY WITH ENDOBRONCHIAL ULTRASOUND;  Surgeon: Loreli Slot, MD;  Location: [facility] OR;  Service: Thoracic;  Laterality: N/A;    Prior to Admission medications   Medication Sig Start Date End Date Taking? Authorizing Provider  albuterol (VENTOLIN HFA) 108 (90 Base) MCG/ACT inhaler INHALE 2 PUFFS INTO THE LUNGS EVERY 6 HOURS AS NEEDED FOR WHEEZE OR SHORTNESS OF BREATH 01/19/23  Yes Hawks, Edilia Bo, FNP  allopurinol (ZYLOPRIM)  300 MG tablet Take 1 tablet (300 mg total) by mouth daily. 09/16/22 09/16/23 Yes Hawks, Christy A, FNP  aspirin EC 81 MG tablet Take 1 tablet (81 mg total) by mouth daily. Swallow whole. 05/02/22  Yes Lanier Prude, MD  Fluticasone-Umeclidin-Vilant (TRELEGY ELLIPTA) 100-62.5-25 MCG/ACT AEPB Inhale 1 puff into the lungs daily. 12/26/22  Yes Hawks, Christy A, FNP  gabapentin (NEURONTIN) 300 MG capsule TAKE 1 CAPSULE BY MOUTH THREE TIMES A DAY 12/01/22  Yes Hawks, Christy A, FNP  insulin degludec (TRESIBA FLEXTOUCH) 200 UNIT/ML FlexTouch Pen Inject 16 Units into the skin every evening. 12/29/22  Yes Reardon, Freddi Starr, NP  levothyroxine (SYNTHROID) 50 MCG tablet TAKE 1 TABLET BY MOUTH EVERY  DAY 12/03/22  Yes Hawks, Edilia Bo, FNP  linaclotide (LINZESS) 72 MCG capsule Take 1 capsule (72 mcg total) by mouth daily before breakfast. 01/06/23  Yes Hawks, Christy A, FNP  NON FORMULARY CPAP at bedtime   Yes [provider]  ondansetron (ZOFRAN) 4 MG tablet Take 1 tablet (4 mg total) by mouth every 8 (eight) hours as needed for nausea or vomiting. 12/26/22  Yes Hawks, Christy A, FNP  OXYGEN Inhale 2 L into the lungs daily as needed (with Cpap at night).   Yes [provider]  pantoprazole (PROTONIX) 40 MG tablet Take 1 tablet (40 mg total) by mouth daily. 01/06/23  Yes Hawks, Christy A, FNP  pravastatin (PRAVACHOL) 40 MG tablet TAKE 1 TABLET (40 MG TOTAL) BY MOUTH ON MONDAYS , WEDNESDAYS, AND FRIDAYS AS DIRECTED 10/23/22  Yes Hawks, Christy A, FNP  spironolactone (ALDACTONE) 25 MG tablet Take 0.5 tablets (12.5 mg total) by mouth daily. 12/30/22 03/30/23 Yes Jodelle Gross, NP  torsemide (DEMADEX) 20 MG tablet Take 1 tablet (20 mg total) by mouth daily. Patient taking differently: Take 20 mg by mouth 2 (two) times daily. 03/03/22  Yes Hawks, Christy A, FNP  zolpidem (AMBIEN) 5 MG tablet Take 1 tablet (5 mg total) by mouth at bedtime as needed for sleep. Patient taking differently: Take 5 mg by mouth at bedtime. 01/06/23  Yes Hawks, Christy A, FNP  Blood Glucose Monitoring Suppl (ONETOUCH VERIO REFLECT) w/Device KIT Use to test blood sugars daily as directed. DX: E11.9 10/14/19   Jannifer Rodney A, FNP  clopidogrel (PLAVIX) 75 MG tablet Take 1 tablet (75 mg total) by mouth daily. 02/05/23 02/05/24  Cephus Shelling, MD  Continuous Glucose Receiver (FREESTYLE LIBRE 3 READER) DEVI 1 Device by Does not apply route every 14 (fourteen) days. 08/11/22   John Spencer, FNP  Continuous Glucose Sensor (FREESTYLE LIBRE 3 SENSOR) MISC 1 Device by Does not apply route continuous. 08/11/22   Jannifer Rodney A, FNP  dutasteride (AVODART) 0.5 MG capsule Take 1 capsule (0.5 mg total) by mouth  daily. 10/31/22   McKenzie, Mardene Celeste, MD  Insulin Pen Needle (BD PEN NEEDLE NANO U/F) 32G X 4 MM MISC Use to inject insulin once daily 12/29/22   Dani Gobble, NP  OneTouch Delica Lancets 30G MISC Use to test blood sugars daily as directed. DX: E11.9 02/24/22   Dani Gobble, NP    Current Facility-Administered Medications  Medication Dose Route Frequency Provider Last Rate Last Admin   acetaminophen (TYLENOL) tablet 650 mg  650 mg Oral Q6H PRN Emokpae, Ejiroghene E, MD       Or   acetaminophen (TYLENOL) suppository 650 mg  650 mg Rectal Q6H PRN Emokpae, Ejiroghene E, MD       albuterol (  PROVENTIL) (2.5 MG/3ML) 0.083% nebulizer solution 2.5 mg  2.5 mg Inhalation Q6H PRN Emokpae, Ejiroghene E, MD   2.5 mg at 02/18/23 9562   allopurinol (ZYLOPRIM) tablet 300 mg  300 mg Oral Daily Emokpae, Ejiroghene E, MD       Chlorhexidine Gluconate Cloth 2 % PADS 6 each  6 each Topical Daily Shah, Pratik D, DO       fluticasone furoate-vilanterol (BREO ELLIPTA) 100-25 MCG/ACT 1 puff  1 puff Inhalation Daily Emokpae, Ejiroghene E, MD   1 puff at 02/18/23 0825   And   umeclidinium bromide (INCRUSE ELLIPTA) 62.5 MCG/ACT 1 puff  1 puff Inhalation Daily Emokpae, Ejiroghene E, MD   1 puff at 02/18/23 0825   gabapentin (NEURONTIN) capsule 300 mg  300 mg Oral TID Emokpae, Ejiroghene E, MD   300 mg at 02/17/23 2209   insulin aspart (novoLOG) injection 0-15 Units  0-15 Units Subcutaneous Q6H Emokpae, Ejiroghene E, MD   2 Units at 02/18/23 0542   levothyroxine (SYNTHROID) tablet 50 mcg  50 mcg Oral Daily Emokpae, Ejiroghene E, MD       linaclotide (LINZESS) capsule 72 mcg  72 mcg Oral QAC breakfast Emokpae, Ejiroghene E, MD       ondansetron (ZOFRAN) tablet 4 mg  4 mg Oral Q6H PRN Emokpae, Ejiroghene E, MD       Or   ondansetron (ZOFRAN) injection 4 mg  4 mg Intravenous Q6H PRN Emokpae, Ejiroghene E, MD       pantoprazole (PROTONIX) injection 40 mg  40 mg Intravenous Q12H Emokpae, Ejiroghene E, MD   40 mg at  02/18/23 0445   polyethylene glycol (MIRALAX / GLYCOLAX) packet 17 g  17 g Oral Daily PRN Emokpae, Ejiroghene E, MD       zolpidem (AMBIEN) tablet 5 mg  5 mg Oral QHS PRN Emokpae, Ejiroghene E, MD        Allergies as of 02/17/2023 - Review Complete 02/17/2023  Allergen Reaction Noted   Sulfa antibiotics Swelling 06/18/2020   Atorvastatin Other (See Comments) 08/21/2020   Jardiance [empagliflozin] Other (See Comments) 09/23/2019   Lopressor [metoprolol] Other (See Comments) 09/21/2019   Rosuvastatin Other (See Comments) 08/21/2020   Doxycycline Nausea Only 08/23/2021   Tramadol Itching 11/13/2022   Temazepam Other (See Comments) 10/04/2020    Review of Systems   Gen: Denies any fever, chills, loss of appetite, change in weight or weight loss CV: Denies chest pain, heart palpitations, syncope, edema  Resp: Denies shortness of breath with rest, cough, wheezing, coughing up blood, and pleurisy. GI:  denies  hematochezia, vomiting, dysphagia, odyonophagia,or weight loss. +nausea +epigastric discomfort +early satiety +melena +diarrhea/constipation GU : Denies urinary burning, blood in urine, urinary frequency, and urinary incontinence. MS: Denies joint pain, limitation of movement, swelling, cramps, and atrophy.  Derm: Denies rash, itching, dry skin, hives. Psych: Denies depression, anxiety, memory loss, hallucinations, and confusion. Heme: Denies bruising or bleeding Neuro:  Denies any headaches, dizziness, paresthesias, shaking  Physical Exam   Vital Signs in last 24 hours: Temp:  [97.4 F (36.3 C)-98.3 F (36.8 C)] 98.1 F (36.7 C) (11/13 0505) Pulse Rate:  [107-125] 107 (11/13 0505) Resp:  [11-20] 18 (11/13 0505) BP: (106-130)/(57-73) 106/62 (11/13 0505) SpO2:  [90 %-100 %] 94 % (11/13 0832) Weight:  [70.3 kg] 70.3 kg (11/12 1252) Last BM Date : 02/16/23  General:   Alert,  Well-developed, well-nourished, pleasant and cooperative in NAD Head:  Normocephalic and  atraumatic. Eyes:  Sclera clear, no  icterus.   Conjunctiva pale Mouth:  No deformity or lesions, dentition normal. Neck:  Supple; no masses Lungs:  Clear throughout to auscultation.   No wheezes, crackles, or rhonchi. No acute distress. Heart:  Regular rate and rhythm; no murmurs, clicks, rubs,  or gallops. Abdomen:  Soft, and nondistended. Mild TTP of epigastric region. No masses, hepatosplenomegaly or hernias noted. Normal bowel sounds, without guarding, and without rebound.   Msk:  Symmetrical without gross deformities. Normal posture. Extremities:  Without clubbing or edema. Neurologic:  Alert and  oriented x4. Skin:  skin is pale  Psych:  Alert and cooperative. Normal mood and affect.  Intake/Output from previous day: 11/12 0701 - 11/13 0700 In: 120 [P.O.:120] Out: -  Intake/Output this shift: No intake/output data recorded.  Labs/Studies   Recent Labs Recent Labs    02/17/23 1049 02/17/23 1312 02/17/23 1941 02/18/23 0447  WBC 10.9* 10.6*  --  8.9  HGB 7.1* 7.3* 8.7* 8.0*  HCT 24.3* 25.0* 29.4* 27.7*  PLT 444 422*  --  444*   BMET Recent Labs    02/17/23 1312 02/18/23 0447  NA 132* 133*  K 3.9 3.6  CL 94* 98  CO2 29 30  GLUCOSE 216* 111*  BUN 27* 24*  CREATININE 1.11 1.00  CALCIUM 8.6* 8.4*   LFT Recent Labs    02/17/23 1312  PROT 7.3  ALBUMIN 3.3*  AST 13*  ALT 11  ALKPHOS 86  BILITOT 0.3   PT/INR Recent Labs    02/17/23 1312  LABPROT 13.3  INR 1.0   Radiology/Studies CT ABDOMEN PELVIS W CONTRAST  Result Date: 02/18/2023 CLINICAL DATA:  S/p angioplasty and stent of right SFA, and right external iliac artery- 10/31. New acute anemia. EXAM: CT ABDOMEN AND PELVIS WITH CONTRAST TECHNIQUE: Multidetector CT imaging of the abdomen and pelvis was performed using the standard protocol following bolus administration of intravenous contrast. RADIATION DOSE REDUCTION: This exam was performed according to the departmental dose-optimization program which  includes automated exposure control, adjustment of the mA and/or kV according to patient size and/or use of iterative reconstruction technique. CONTRAST:  OMNIPAQUE IOHEXOL 300 MG/ML SOLN, 30mL OMNIPAQUE IOHEXOL 300 MG/ML SOLN COMPARISON:  Head CT 05/15/2022 FINDINGS: Lower chest: Coronary calcification. Mitral annular calcification. No acute abnormality. Hepatobiliary: No focal liver abnormality. Calcified gallstone noted within the gallbladder lumen. No gallbladder wall thickening or pericholecystic fluid. No biliary dilatation. Pancreas: No focal lesion. Normal pancreatic contour. No surrounding inflammatory changes. No main pancreatic ductal dilatation. Spleen: Normal in size without focal abnormality. Adrenals/Urinary Tract: No adrenal nodule bilaterally. Bilateral kidneys enhance symmetrically. Fluid density lesion within the right kidney likely represents a simple renal cyst. Simple renal cysts, in the absence of clinically indicated signs/symptoms, require no independent follow-up. No hydronephrosis. No hydroureter. The urinary bladder is unremarkable. TURP procedure noted. Urachal diverticula noted. On delayed imaging, there is no urothelial wall thickening and there are no filling defects in the opacified portions of the bilateral collecting systems or ureters. Stomach/Bowel: Stomach is within normal limits. No evidence of bowel wall thickening or dilatation. Colonic diverticulosis. Appendix appears normal. Vascular/Lymphatic: Patent right common and external iliac stents (58, 4:63). No abdominal aorta or iliac aneurysm. Severe atherosclerotic plaque of the aorta and its branches. No abdominal, pelvic, or inguinal lymphadenopathy. Reproductive: Prostate is unremarkable. Other: No intraperitoneal free fluid. No intraperitoneal free gas. No organized fluid collection. Musculoskeletal: Tiny fat containing umbilical hernia. No suspicious lytic or blastic osseous lesions. No acute displaced fracture.  Multilevel  degenerative changes of the spine. Multilevel posterior disc osteophyte complex formation. IMPRESSION: 1. No acute intra-abdominal or intrapelvic abnormality. 2. Cholelithiasis with no acute cholecystitis. 3. Colonic diverticulosis with no acute diverticulitis. 4. Aortic Atherosclerosis (ICD10-I70.0) including coronary artery and mitral annular calcifications. Patent right common and external iliac stents Electronically Signed   By: Tish Frederickson M.D.   On: 02/18/2023 01:52   DG Chest Portable 1 View  Result Date: 02/17/2023 CLINICAL DATA:  Weakness EXAM: PORTABLE CHEST 1 VIEW COMPARISON:  Chest x-ray 08/11/2022.  Chest CT 12/16/2022. FINDINGS: Left chest port catheter tip ends in the SVC. Loop recorder device overlies the chest, unchanged. Focal opacities in the bilateral upper lobes appear unchanged from prior CT 12/16/2022. No new focal lung infiltrate, pleural effusion or pneumothorax. Cardiomediastinal silhouette appears within limits. IMPRESSION: Focal opacities in the bilateral upper lobes appear unchanged from prior CT 12/16/2022. No new focal lung infiltrate. Electronically Signed   By: Darliss Cheney M.D.   On: 02/17/2023 16:39     Assessment   John [name] is a 77 y.o. year old male with complex medical history who presented to the hospital after being seen by PCP and found to have low hemoglobin, with complaints of melena and fatigue, hemoglobin on admission 7.3, GI consulted for further evaluation.  Melena/anemia: Patient reporting melena over the past 2 weeks as well as some shortness of breath with exertion and generalized fatigue that sounds to have began after initiation of Plavix status post abdominal aortogram on 02/05/2023.  Patient also endorses some epigastric discomfort, early satiety and nausea recently.  He is maintained on baby aspirin daily.   Denies bright red blood per rectum.  FOBT was negative but iron panel suggest iron deficiency anemia.  He has notable  history of both upper and lower GI angiodysplastic lesions. Hgb 8 this morning, down from 8.7 yesterday after blood transfusion.  He is on daily Plavix and daily baby aspirin, last Plavix dose was 02/15/2023  Recommend proceeding with upper endoscopy for further evaluation as I cannot rule out recurrent bleeding angiodysplastic lesions, PUD, gastritis, duodenitis especially in setting of Plavix and daily aspirin.  Indications, risks and benefits of procedure discussed in detail with patient. Patient verbalized understanding and is in agreement to proceed with EGD.   Plan / Recommendations    Continue to hold Plavix PPI twice daily Remain NPO, EGD later today with Dr. Jena Gauss Trend hemoglobin, transfuse for hemoglobin less than 7 Monitor for overt GI bleeding    02/18/2023, 9:07 AM  Kattie Santoyo L. Jeanmarie Hubert, MSN, APRN, AGNP-C Adult-Gerontology Nurse Practitioner [facility] at [facility]

## 2023-02-19 ENCOUNTER — Encounter (HOSPITAL_COMMUNITY)
Admission: EM | Disposition: A | Discharge status: Home Health | Payer: Self-pay | Source: Ambulatory Visit | Attending: Internal Medicine

## 2023-02-19 DIAGNOSIS — K921 Melena: Secondary | ICD-10-CM

## 2023-02-19 DIAGNOSIS — D500 Iron deficiency anemia secondary to blood loss (chronic): Secondary | ICD-10-CM

## 2023-02-19 DIAGNOSIS — D649 Anemia, unspecified: Secondary | ICD-10-CM | POA: Diagnosis not present

## 2023-02-19 DIAGNOSIS — K31819 Angiodysplasia of stomach and duodenum without bleeding: Secondary | ICD-10-CM | POA: Diagnosis not present

## 2023-02-19 HISTORY — PX: GIVENS CAPSULE STUDY: SHX5432

## 2023-02-19 LAB — BASIC METABOLIC PANEL
Anion gap: 10 (ref 5–15)
BUN: 19 mg/dL (ref 8–23)
CO2: 31 mmol/L (ref 22–32)
Calcium: 8.8 mg/dL — ABNORMAL LOW (ref 8.9–10.3)
Chloride: 96 mmol/L — ABNORMAL LOW (ref 98–111)
Creatinine, Ser: 0.97 mg/dL (ref 0.61–1.24)
GFR, Estimated: >60 mL/min (ref >60)
Glucose, Bld: 122 mg/dL — ABNORMAL HIGH (ref 70–99)
Potassium: 3.7 mmol/L (ref 3.5–5.1)
Sodium: 137 mmol/L (ref 135–145)

## 2023-02-19 LAB — CBC
HCT: 27.9 % — ABNORMAL LOW (ref 39.0–52.0)
Hemoglobin: 8.3 g/dL — ABNORMAL LOW (ref 13.0–17.0)
MCH: 26.8 pg (ref 26.0–34.0)
MCHC: 29.7 g/dL — ABNORMAL LOW (ref 30.0–36.0)
MCV: 90 fL (ref 80.0–100.0)
Platelets: 455 10*3/uL — ABNORMAL HIGH (ref 150–400)
RBC: 3.1 MIL/uL — ABNORMAL LOW (ref 4.22–5.81)
RDW: 15.9 % — ABNORMAL HIGH (ref 11.5–15.5)
WBC: 10.5 10*3/uL (ref 4.0–10.5)
nRBC: 0 % (ref 0.0–0.2)

## 2023-02-19 LAB — GLUCOSE, CAPILLARY
Glucose-Capillary: 128 mg/dL — ABNORMAL HIGH (ref 70–99)
Glucose-Capillary: 114 mg/dL — ABNORMAL HIGH (ref 70–99)
Glucose-Capillary: 123 mg/dL — ABNORMAL HIGH (ref 70–99)

## 2023-02-19 LAB — MAGNESIUM: Magnesium: 2.3 mg/dL (ref 1.7–2.4)

## 2023-02-19 SURGERY — IMAGING PROCEDURE, GI TRACT, INTRALUMINAL, VIA CAPSULE

## 2023-02-19 MED ORDER — FERROUS SULFATE 325 (65 FE) MG PO TBEC
325.0000 mg | DELAYED_RELEASE_TABLET | Freq: Two times a day (BID) | ORAL | 3 refills | Status: DC
Start: 2023-02-19 — End: 2023-05-31

## 2023-02-19 MED ORDER — FAMOTIDINE 20 MG PO TABS: 20.0000 mg | ORAL_TABLET | Freq: Every day | ORAL | 1 refills | Status: DC

## 2023-02-19 MED ORDER — DOCUSATE SODIUM 100 MG PO CAPS: 100.0000 mg | ORAL_CAPSULE | Freq: Two times a day (BID) | ORAL | 2 refills | Status: DC

## 2023-02-19 NOTE — Progress Notes (Addendum)
Gastroenterology Progress Note   Referring Provider: No ref. provider found Primary Care Physician:  Junie Spencer, FNP Primary Gastroenterologist:  Dr. Leone Payor  Patient ID: [name]; [id number]; 03-09-1946   Subjective:    No further melena since admission. No abdominal pain. No n/v. Feels hungry.  Objective:   Vital signs in last 24 hours: Temp:  [97.5 F (36.4 C)-98.3 F (36.8 C)] 98.3 F (36.8 C) (11/13 2014) Pulse Rate:  [103-108] 104 (11/14 0602) Resp:  [16-22] 20 (11/14 0602) BP: (90-134)/(50-68) 111/58 (11/14 0602) SpO2:  [90 %-100 %] 100 % (11/14 0723) Last BM Date : 02/18/23 General:   Alert,  Well-developed, well-nourished, pleasant and cooperative in NAD Head:  Normocephalic and atraumatic. Eyes:  Sclera clear, no icterus.   Abdomen:  Soft, nontender and nondistended. . Normal bowel sounds, without guarding, and without rebound.   Extremities:  Without clubbing, deformity or edema. Neurologic:  Alert and  oriented x4;  grossly normal neurologically. Skin:  Intact without significant lesions or rashes. Psych:  Alert and cooperative. Normal mood and affect.  Intake/Output from previous day: 11/13 0701 - 11/14 0700 In: 100 [I.V.:100] Out: -  Intake/Output this shift: Total I/O In: -  Out: 400 [Urine:400]  Lab Results: CBC Recent Labs    02/17/23 1312 02/17/23 1941 02/18/23 0447 02/19/23 0453  WBC 10.6*  --  8.9 10.5  HGB 7.3* 8.7* 8.0* 8.3*  HCT 25.0* 29.4* 27.7* 27.9*  MCV 90.6  --  89.1 90.0  PLT 422*  --  444* 455*   BMET Recent Labs    02/17/23 1312 02/18/23 0447 02/19/23 0453  NA 132* 133* 137  K 3.9 3.6 3.7  CL 94* 98 96*  CO2 29 30 31   GLUCOSE 216* 111* 122*  BUN 27* 24* 19  CREATININE 1.11 1.00 0.97  CALCIUM 8.6* 8.4* 8.8*   LFTs Recent Labs    02/17/23 1312  BILITOT 0.3  ALKPHOS 86  AST 13*  ALT 11  PROT 7.3  ALBUMIN 3.3*   No results for input(s): "LIPASE" in the last 72 hours. PT/INR Recent Labs     02/17/23 1312  LABPROT 13.3  INR 1.0         Imaging Studies: CT ABDOMEN PELVIS W CONTRAST  Result Date: 02/18/2023 CLINICAL DATA:  S/p angioplasty and stent of right SFA, and right external iliac artery- 10/31. New acute anemia. EXAM: CT ABDOMEN AND PELVIS WITH CONTRAST TECHNIQUE: Multidetector CT imaging of the abdomen and pelvis was performed using the standard protocol following bolus administration of intravenous contrast. RADIATION DOSE REDUCTION: This exam was performed according to the departmental dose-optimization program which includes automated exposure control, adjustment of the mA and/or kV according to patient size and/or use of iterative reconstruction technique. CONTRAST:  OMNIPAQUE IOHEXOL 300 MG/ML SOLN, 30mL OMNIPAQUE IOHEXOL 300 MG/ML SOLN COMPARISON:  Head CT 05/15/2022 FINDINGS: Lower chest: Coronary calcification. Mitral annular calcification. No acute abnormality. Hepatobiliary: No focal liver abnormality. Calcified gallstone noted within the gallbladder lumen. No gallbladder wall thickening or pericholecystic fluid. No biliary dilatation. Pancreas: No focal lesion. Normal pancreatic contour. No surrounding inflammatory changes. No main pancreatic ductal dilatation. Spleen: Normal in size without focal abnormality. Adrenals/Urinary Tract: No adrenal nodule bilaterally. Bilateral kidneys enhance symmetrically. Fluid density lesion within the right kidney likely represents a simple renal cyst. Simple renal cysts, in the absence of clinically indicated signs/symptoms, require no independent follow-up. No hydronephrosis. No hydroureter. The urinary bladder is unremarkable. TURP procedure noted. Urachal diverticula  noted. On delayed imaging, there is no urothelial wall thickening and there are no filling defects in the opacified portions of the bilateral collecting systems or ureters. Stomach/Bowel: Stomach is within normal limits. No evidence of bowel wall thickening or  dilatation. Colonic diverticulosis. Appendix appears normal. Vascular/Lymphatic: Patent right common and external iliac stents (58, 4:63). No abdominal aorta or iliac aneurysm. Severe atherosclerotic plaque of the aorta and its branches. No abdominal, pelvic, or inguinal lymphadenopathy. Reproductive: Prostate is unremarkable. Other: No intraperitoneal free fluid. No intraperitoneal free gas. No organized fluid collection. Musculoskeletal: Tiny fat containing umbilical hernia. No suspicious lytic or blastic osseous lesions. No acute displaced fracture. Multilevel degenerative changes of the spine. Multilevel posterior disc osteophyte complex formation. IMPRESSION: 1. No acute intra-abdominal or intrapelvic abnormality. 2. Cholelithiasis with no acute cholecystitis. 3. Colonic diverticulosis with no acute diverticulitis. 4. Aortic Atherosclerosis (ICD10-I70.0) including coronary artery and mitral annular calcifications. Patent right common and external iliac stents Electronically Signed   By: Tish Frederickson M.D.   On: 02/18/2023 01:52   DG Chest Portable 1 View  Result Date: 02/17/2023 CLINICAL DATA:  Weakness EXAM: PORTABLE CHEST 1 VIEW COMPARISON:  Chest x-ray 08/11/2022.  Chest CT 12/16/2022. FINDINGS: Left chest port catheter tip ends in the SVC. Loop recorder device overlies the chest, unchanged. Focal opacities in the bilateral upper lobes appear unchanged from prior CT 12/16/2022. No new focal lung infiltrate, pleural effusion or pneumothorax. Cardiomediastinal silhouette appears within limits. IMPRESSION: Focal opacities in the bilateral upper lobes appear unchanged from prior CT 12/16/2022. No new focal lung infiltrate. Electronically Signed   By: Darliss Cheney M.D.   On: 02/17/2023 16:39   CUP PACEART REMOTE DEVICE CHECK  Result Date: 02/09/2023 ILR summary report received. Battery status OK. Normal device function. No new symptom, tachy, brady, or pause episodes. No new AF episodes. Monthly  summary reports and ROV/PRN.  38 AT events, day and night HR's ~111 LA, CVRS  PERIPHERAL VASCULAR CATHETERIZATION  Result Date: 02/05/2023 Images from the original result were not included.   Patient name: SNOWDEN FAFARD        MRN: [id number]        DOB: [birth year]          Sex: male  02/05/2023 Pre-operative Diagnosis: Recurrent lifestyle-limiting disabling claudication right leg Post-operative diagnosis:  Same Surgeon:  Cephus Shelling, MD Procedure Performed: 1.  Ultrasound-guided access left common femoral artery 2.  Aortogram with catheter selection of aorta 3.  Right lower extremity arteriogram including catheter selection of right SFA 4.  Angioplasty and stent of right SFA (5 mm x 100 mm drug-coated Zilver) 5.  Angioplasty and stent of right external iliac artery (7 mm x 40 mm Innova) 6.  56 minutes of monitored moderate conscious sedation time  Indications: Patient is a 77 year old male that has previously undergone right common femoral endarterectomy with profundoplasty and retrograde right common iliac stenting for lifestyle-limiting claudication on 08/20/2020.  He has had additional interventions including right SFA DCB.  Recently seen with increasing right leg symptoms consistent with claudication and then has evidence of both a right external iliac high-grade stenosis and a right SFA high-grade stenosis.  Presents for intervention after risks benefits discussed.  Findings:  Ultrasound-guided access left common femoral artery.  Abdominal aortogram showed the 2 renal arteries on the right and the left main renal patent.  Infrarenal aorta is widely patent.  He has severe calcified plaque in the left common iliac artery but he has no left  leg complaints.  On the right his common iliac artery stent is widely patent.  He has a greater than 60%-70% stenosis of the right external iliac artery.  Distally the common femoral and profundus patent.  The SFA distally had a high-grade greater than 90%  stenosis.  Beyond this he has a patent popliteal artery with three-vessel runoff.  Ultimately after evaluating images the right SFA high-grade stenosis over 90% was treated with a 5 mm x 100 mm drug-coated Zilver postdilated with a 4 mm balloon.  The artery is small.  Elected for stent placement given he had DCB in the past with recurrence of stenosis.  We also elected to stent the right external iliac artery with a 7 mm x 40 mm Innova that was post-dilated with a 6 mm balloon.  Both stents widely patent at completion preserved runoff distally.             Procedure:  The patient was identified in the holding area and taken to room 8.  The patient was then placed supine on the table and prepped and draped in the usual sterile fashion.  A time out was called.  The patient received Versed and fentanyl for conscious moderate sedation.  Vital signs were monitored including heart rate, respiratory rate, oxygenation and blood pressure.  I was present for all moderate sedation.  Ultrasound was used to evaluate the left common femoral artery.  It was patent .  A digital ultrasound image was acquired.  A micropuncture needle was used to access the left common femoral artery under ultrasound guidance.  An 018 wire was advanced without resistance and a micropuncture sheath was placed.  The 018 wire was removed and a benson wire was placed.  The micropuncture sheath was exchanged for a 5 french sheath.  An omniflush catheter was advanced over the wire to the level of L-1.  An abdominal angiogram was obtained.  Next, using the omniflush catheter and a benson wire, the aortic bifurcation was crossed and the catheter was placed into theright external iliac artery and right runoff was obtained.  Ultimately after evaluating images elected for intervention.  I used a Glidewire advantage to cannulate the right SFA and we upsized to a 6 [place] catapult sheath in the left groin over the aortic bifurcation.  Patient was given 100 units  per kilogram IV heparin.  I then went down the right SFA across the distal SFA high-grade stenosis with a quick cross catheter and the Glidewire advantage.  This lesion was then stented with a 5 mm x 100 mm drug-coated Zilver postdilated with a 4 mm angioplasty balloon.  The artery was small only measuring about 4 mm.  We then went ahead and elected to stent the right external iliac stenosis with a 7 mm x 40 mm Innova postdilated with a 6 mm Mustang.  This was widely patent at completion with preserved runoff distally.  Wires and catheters were removed.  6 French sheath was placed in the left groin.  I elected not to close them due to significant iliac disease on the left.  Plan: Aspirin statin Plavix.  Follow-up in 1 month with aortoiliac duplex, right leg duplex, and ABIs.  Cephus Shelling, MD [facility] Office: [phone]   [2 weeks]  Assessment:   KENZIE PASTRAN is a 77 y.o. year old male with complex medical history who presented to the hospital after being seen by PCP and found to have low hemoglobin, with  complaints of melena and fatigue, hemoglobin on admission 7.3, GI consulted for further evaluation.   Melena/anemia: Patient reporting melena over the past 2 weeks as well as some shortness of breath with exertion and generalized fatigue that sounds to have began after initiation of Plavix status post abdominal aortogram on 02/05/2023.  Patient also endorses some epigastric discomfort, early satiety and nausea recently.  He is maintained on baby aspirin daily.   Denies bright red blood per rectum.  FOBT was negative but iron panel suggest iron deficiency anemia.  He has notable history of both upper and lower GI angiodysplastic lesions. Patient received 1 unit of packed red blood cells this admission.   Small bowel enteroscopy yesterday showed 2 tiny AVMs in the cardia, innocent appearing, status post APC ablation.  Examination completed to proximal jejunum.   Findings were not adequately explain recurrent GI bleeding, entire small bowel to be evaluated for capsule endoscopy.  Patient swallowed capsule camera today.  Results to be available tomorrow. He denies further melena. Hgb appropriate after one unit of prbcs.   Plan:   Follow up capsule results when available. He will need to have ongoing surveillance of his Hgb given risk of recurrent anemia with angiectasias.  Recent PVD stents currently Plavix on hold. He will need to have Plavix restarted, ?may require mono antiplatelet therapy.  NPO for now due to capsule, we will start clear liquids later today, solid foods AFTER completion of capsule study at 4pm.  Patient will need to follow up with his primary GI, [place].    LOS: 1 day   Leanna Battles. [place] [facility]os [facility] [phone] 11/14/202410:08 AM

## 2023-02-19 NOTE — Discharge Summary (Signed)
Physician Discharge Summary  John [name] QVZ:[id number] DOB: 10-08-1945 DOA: 02/17/2023  PCP: Junie Spencer, FNP  Admit date: 02/17/2023  Discharge date: 02/19/2023  Admitted From:Home  Disposition:  Home  Recommendations for Outpatient Follow-up:  Follow up with PCP in 1-2 weeks Follow up with GI Dr. Leone Payor with referral sent; GI to read capsule study Ok to resume all home medications including Plavix per GI Ferrous sulfate bid for iron deficiency  Home Health:Yes with PT  Equipment/Devices:Has home CPAP and oxygen  Discharge Condition:Stable  CODE STATUS: Full  Diet recommendation: Heart Healthy  Brief/Interim Summary:  John [name] is a 77 y.o. male with medical history significant for peripheral artery disease, OSA on CPAP, diabetes mellitus, hypertension, COPD, diastolic CHF, lung cancer. Patient presented to the ED with complaints of difficulty breathing with exertion and generalized fatigue, over the past 2 weeks at least. Patient recently underwent right lower extremity arteriogram, angioplasty and stent of right SFA, and right external iliac artery-by Dr. Chestine Spore.  He did well with the procedure. He is unsure if his symptoms started before or after this procedure.  Spouse reports she noticed patient's changing color and becoming more pale after the procedure.  No abdominal pain. Patient also reports over the past 2 weeks has had black stools.  No vomiting.  No gross blood in stools. He is on aspirin and Plavix.  His last dose of Plavix was 2 days ago-02/15/2023.   Patient was admitted for symptomatic anemia and has received 1 unit PRBC transfusion and has had no further bleeding. He underwent EGD on 11/13 with APC to to AVMs in the stomach. He then underwent capsule study on 11/14 with results pending, but is stable from GI standpoint for discharge and close follow up.  Discharge Diagnoses:  Principal Problem:   Symptomatic anemia Active Problems:   COPD  (chronic obstructive pulmonary disease) (HCC)   Chronic diastolic CHF (congestive heart failure) (HCC)   Mixed diabetic hyperlipidemia associated with type 2 diabetes mellitus (HCC)   Non-small cell lung cancer, right (HCC)   PAD (peripheral artery disease) (HCC)   Obstructive sleep apnea treated with continuous positive airway pressure (CPAP)   Acute blood loss anemia   Melena  Principal discharge diagnosis: Symptomatic acute blood loss anemia.  Discharge Instructions  Discharge Instructions     Diet - low sodium heart healthy   Complete by: As directed    Increase activity slowly   Complete by: As directed       Allergies as of 02/19/2023       Reactions   Sulfa Antibiotics Swelling   Mouth swelling   Atorvastatin Other (See Comments)   Muscle aches - tolerating Pravastatin 40 mg MWF   Jardiance [empagliflozin] Other (See Comments)   FEELS SLUGGISH, TIRED   Lopressor [metoprolol] Other (See Comments)   Fatigue   Rosuvastatin Other (See Comments)   Muscle aches - tolerating Pravastatin 40 mg MWF   Doxycycline Nausea Only   Tramadol Itching   Temazepam Other (See Comments)   Made insomnia worse         Medication List     TAKE these medications    albuterol 108 (90 Base) MCG/ACT inhaler Commonly known as: VENTOLIN HFA INHALE 2 PUFFS INTO THE LUNGS EVERY 6 HOURS AS NEEDED FOR WHEEZE OR SHORTNESS OF BREATH   allopurinol 300 MG tablet Commonly known as: Zyloprim Take 1 tablet (300 mg total) by mouth daily.   aspirin EC 81 MG tablet Take 1  tablet (81 mg total) by mouth daily. Swallow whole.   BD Pen Needle Nano U/F 32G X 4 MM Misc Generic drug: Insulin Pen Needle Use to inject insulin once daily   clopidogrel 75 MG tablet Commonly known as: Plavix Take 1 tablet (75 mg total) by mouth daily.   docusate sodium 100 MG capsule Commonly known as: Colace Take 1 capsule (100 mg total) by mouth 2 (two) times daily.   dutasteride 0.5 MG capsule Commonly  known as: AVODART Take 1 capsule (0.5 mg total) by mouth daily.   famotidine 20 MG tablet Commonly known as: PEPCID Take 1 tablet (20 mg total) by mouth daily.   ferrous sulfate 325 (65 FE) MG EC tablet Take 1 tablet (325 mg total) by mouth 2 (two) times daily.   FreeStyle Libre 3 Reader Devi 1 Device by Does not apply route every 14 (fourteen) days.   FreeStyle Libre 3 Sensor Misc 1 Device by Does not apply route continuous.   gabapentin 300 MG capsule Commonly known as: NEURONTIN TAKE 1 CAPSULE BY MOUTH THREE TIMES A DAY   levothyroxine 50 MCG tablet Commonly known as: SYNTHROID TAKE 1 TABLET BY MOUTH EVERY DAY   linaclotide 72 MCG capsule Commonly known as: Linzess Take 1 capsule (72 mcg total) by mouth daily before breakfast.   NON FORMULARY CPAP at bedtime   ondansetron 4 MG tablet Commonly known as: Zofran Take 1 tablet (4 mg total) by mouth every 8 (eight) hours as needed for nausea or vomiting.   OneTouch Delica Lancets 30G Misc Use to test blood sugars daily as directed. DX: E11.9   OneTouch Verio Reflect w/Device Kit Use to test blood sugars daily as directed. DX: E11.9   OXYGEN Inhale 2 L into the lungs daily as needed (with Cpap at night).   pantoprazole 40 MG tablet Commonly known as: PROTONIX Take 1 tablet (40 mg total) by mouth daily.   pravastatin 40 MG tablet Commonly known as: PRAVACHOL TAKE 1 TABLET (40 MG TOTAL) BY MOUTH ON MONDAYS , WEDNESDAYS, AND FRIDAYS AS DIRECTED   spironolactone 25 MG tablet Commonly known as: ALDACTONE Take 0.5 tablets (12.5 mg total) by mouth daily.   torsemide 20 MG tablet Commonly known as: DEMADEX Take 1 tablet (20 mg total) by mouth daily. What changed: when to take this   Trelegy Ellipta 100-62.5-25 MCG/ACT Aepb Generic drug: Fluticasone-Umeclidin-Vilant Inhale 1 puff into the lungs daily.   Evaristo Bury FlexTouch 200 UNIT/ML FlexTouch Pen Generic drug: insulin degludec Inject 16 Units into the skin every  evening.   zolpidem 5 MG tablet Commonly known as: AMBIEN Take 1 tablet (5 mg total) by mouth at bedtime as needed for sleep. What changed: when to take this        Follow-up Information     [place], [facility] Follow up.   Contact information: [address] [place] [address] [phone]         Junie Spencer, FNP. Schedule an appointment as soon as possible for a visit in 1 week(s).   Specialty: Family Medicine Contact information: [address] [place] [address] [phone]         Iva Boop, MD. Go to.   Specialty: Gastroenterology Contact information: [address] [place] [address] [phone]                Allergies  Allergen Reactions   Sulfa Antibiotics Swelling    Mouth swelling   Atorvastatin  Other (See Comments)    Muscle aches - tolerating Pravastatin 40 mg MWF   Jardiance [Empagliflozin] Other (See Comments)    FEELS SLUGGISH, TIRED   Lopressor [Metoprolol] Other (See Comments)    Fatigue   Rosuvastatin Other (See Comments)    Muscle aches - tolerating Pravastatin 40 mg MWF   Doxycycline Nausea Only   Tramadol Itching   Temazepam Other (See Comments)    Made insomnia worse     Consultations: GI   Procedures/Studies: CT ABDOMEN PELVIS W CONTRAST  Result Date: 02/18/2023 CLINICAL DATA:  S/p angioplasty and stent of right SFA, and right external iliac artery- 10/31. New acute anemia. EXAM: CT ABDOMEN AND PELVIS WITH CONTRAST TECHNIQUE: Multidetector CT imaging of the abdomen and pelvis was performed using the standard protocol following bolus administration of intravenous contrast. RADIATION DOSE REDUCTION: This exam was performed according to the departmental dose-optimization program which includes automated exposure control, adjustment of the mA and/or kV according to patient size and/or use of iterative reconstruction technique. CONTRAST:  OMNIPAQUE IOHEXOL 300 MG/ML  SOLN, 30mL OMNIPAQUE IOHEXOL 300 MG/ML SOLN COMPARISON:  Head CT 05/15/2022 FINDINGS: Lower chest: Coronary calcification. Mitral annular calcification. No acute abnormality. Hepatobiliary: No focal liver abnormality. Calcified gallstone noted within the gallbladder lumen. No gallbladder wall thickening or pericholecystic fluid. No biliary dilatation. Pancreas: No focal lesion. Normal pancreatic contour. No surrounding inflammatory changes. No main pancreatic ductal dilatation. Spleen: Normal in size without focal abnormality. Adrenals/Urinary Tract: No adrenal nodule bilaterally. Bilateral kidneys enhance symmetrically. Fluid density lesion within the right kidney likely represents a simple renal cyst. Simple renal cysts, in the absence of clinically indicated signs/symptoms, require no independent follow-up. No hydronephrosis. No hydroureter. The urinary bladder is unremarkable. TURP procedure noted. Urachal diverticula noted. On delayed imaging, there is no urothelial wall thickening and there are no filling defects in the opacified portions of the bilateral collecting systems or ureters. Stomach/Bowel: Stomach is within normal limits. No evidence of bowel wall thickening or dilatation. Colonic diverticulosis. Appendix appears normal. Vascular/Lymphatic: Patent right common and external iliac stents (58, 4:63). No abdominal aorta or iliac aneurysm. Severe atherosclerotic plaque of the aorta and its branches. No abdominal, pelvic, or inguinal lymphadenopathy. Reproductive: Prostate is unremarkable. Other: No intraperitoneal free fluid. No intraperitoneal free gas. No organized fluid collection. Musculoskeletal: Tiny fat containing umbilical hernia. No suspicious lytic or blastic osseous lesions. No acute displaced fracture. Multilevel degenerative changes of the spine. Multilevel posterior disc osteophyte complex formation. IMPRESSION: 1. No acute intra-abdominal or intrapelvic abnormality. 2. Cholelithiasis with  no acute cholecystitis. 3. Colonic diverticulosis with no acute diverticulitis. 4. Aortic Atherosclerosis (ICD10-I70.0) including coronary artery and mitral annular calcifications. Patent right common and external iliac stents Electronically Signed   By: Tish Frederickson M.D.   On: 02/18/2023 01:52   DG Chest Portable 1 View  Result Date: 02/17/2023 CLINICAL DATA:  Weakness EXAM: PORTABLE CHEST 1 VIEW COMPARISON:  Chest x-ray 08/11/2022.  Chest CT 12/16/2022. FINDINGS: Left chest port catheter tip ends in the SVC. Loop recorder device overlies the chest, unchanged. Focal opacities in the bilateral upper lobes appear unchanged from prior CT 12/16/2022. No new focal lung infiltrate, pleural effusion or pneumothorax. Cardiomediastinal silhouette appears within limits. IMPRESSION: Focal opacities in the bilateral upper lobes appear unchanged from prior CT 12/16/2022. No new focal lung infiltrate. Electronically Signed   By: Darliss Cheney M.D.   On: 02/17/2023 16:39   CUP PACEART REMOTE DEVICE CHECK  Result Date: 02/09/2023 ILR summary report received.  Battery status OK. Normal device function. No new symptom, tachy, brady, or pause episodes. No new AF episodes. Monthly summary reports and ROV/PRN.  38 AT events, day and night HR's ~111 LA, CVRS  PERIPHERAL VASCULAR CATHETERIZATION  Result Date: 02/05/2023 Images from the original result were not included.   Patient name: HURTIS SCIMECA        MRN: [id number]        DOB: 10/27/1945          Sex: male  02/05/2023 Pre-operative Diagnosis: Recurrent lifestyle-limiting disabling claudication right leg Post-operative diagnosis:  Same Surgeon:  Cephus Shelling, MD Procedure Performed: 1.  Ultrasound-guided access left common femoral artery 2.  Aortogram with catheter selection of aorta 3.  Right lower extremity arteriogram including catheter selection of right SFA 4.  Angioplasty and stent of right SFA (5 mm x 100 mm drug-coated Zilver) 5.  Angioplasty and  stent of right external iliac artery (7 mm x 40 mm Innova) 6.  56 minutes of monitored moderate conscious sedation time  Indications: Patient is a 77 year old male that has previously undergone right common femoral endarterectomy with profundoplasty and retrograde right common iliac stenting for lifestyle-limiting claudication on 08/20/2020.  He has had additional interventions including right SFA DCB.  Recently seen with increasing right leg symptoms consistent with claudication and then has evidence of both a right external iliac high-grade stenosis and a right SFA high-grade stenosis.  Presents for intervention after risks benefits discussed.  Findings:  Ultrasound-guided access left common femoral artery.  Abdominal aortogram showed the 2 renal arteries on the right and the left main renal patent.  Infrarenal aorta is widely patent.  He has severe calcified plaque in the left common iliac artery but he has no left leg complaints.  On the right his common iliac artery stent is widely patent.  He has a greater than 60%-70% stenosis of the right external iliac artery.  Distally the common femoral and profundus patent.  The SFA distally had a high-grade greater than 90% stenosis.  Beyond this he has a patent popliteal artery with three-vessel runoff.  Ultimately after evaluating images the right SFA high-grade stenosis over 90% was treated with a 5 mm x 100 mm drug-coated Zilver postdilated with a 4 mm balloon.  The artery is small.  Elected for stent placement given he had DCB in the past with recurrence of stenosis.  We also elected to stent the right external iliac artery with a 7 mm x 40 mm Innova that was post-dilated with a 6 mm balloon.  Both stents widely patent at completion preserved runoff distally.             Procedure:  The patient was identified in the holding area and taken to room 8.  The patient was then placed supine on the table and prepped and draped in the usual sterile fashion.  A time out was  called.  The patient received Versed and fentanyl for conscious moderate sedation.  Vital signs were monitored including heart rate, respiratory rate, oxygenation and blood pressure.  I was present for all moderate sedation.  Ultrasound was used to evaluate the left common femoral artery.  It was patent .  A digital ultrasound image was acquired.  A micropuncture needle was used to access the left common femoral artery under ultrasound guidance.  An 018 wire was advanced without resistance and a micropuncture sheath was placed.  The 018 wire was removed and a benson wire was placed.  The micropuncture sheath was exchanged for a 5 french sheath.  An omniflush catheter was advanced over the wire to the level of L-1.  An abdominal angiogram was obtained.  Next, using the omniflush catheter and a benson wire, the aortic bifurcation was crossed and the catheter was placed into theright external iliac artery and right runoff was obtained.  Ultimately after evaluating images elected for intervention.  I used a Glidewire advantage to cannulate the right SFA and we upsized to a 6 [place] catapult sheath in the left groin over the aortic bifurcation.  Patient was given 100 units per kilogram IV heparin.  I then went down the right SFA across the distal SFA high-grade stenosis with a quick cross catheter and the Glidewire advantage.  This lesion was then stented with a 5 mm x 100 mm drug-coated Zilver postdilated with a 4 mm angioplasty balloon.  The artery was small only measuring about 4 mm.  We then went ahead and elected to stent the right external iliac stenosis with a 7 mm x 40 mm Innova postdilated with a 6 mm Mustang.  This was widely patent at completion with preserved runoff distally.  Wires and catheters were removed.  6 French sheath was placed in the left groin.  I elected not to close them due to significant iliac disease on the left.  Plan: Aspirin statin Plavix.  Follow-up in 1 month with aortoiliac duplex,  right leg duplex, and ABIs.  Cephus Shelling, MD [facility] Office: [phone]      Discharge Exam: Vitals:   02/19/23 0723 02/19/23 1407  BP:  (!) 109/51  Pulse:  (!) 103  Resp:  17  Temp:    SpO2: 100% 92%   Vitals:   02/18/23 2014 02/19/23 0602 02/19/23 0723 02/19/23 1407  BP: 122/63 (!) 111/58  (!) 109/51  Pulse: (!) 107 (!) 104  (!) 103  Resp: 16 20  17   Temp: 98.3 F (36.8 C)     TempSrc:      SpO2: 98% 100% 100% 92%  Weight:      Height:        General: Pt is alert, awake, not in acute distress Cardiovascular: RRR, S1/S2 +, no rubs, no gallops Respiratory: CTA bilaterally, no wheezing, no rhonchi Abdominal: Soft, NT, ND, bowel sounds + Extremities: no edema, no cyanosis    The results of significant diagnostics from this hospitalization (including imaging, microbiology, ancillary and laboratory) are listed below for reference.     Microbiology: No results found for this or any previous visit (from the past 240 hour(s)).   Labs: BNP (last 3 results) Recent Labs    02/13/23 1233  BNP 104.3*   Basic Metabolic Panel: Recent Labs  Lab 02/13/23 1233 02/17/23 1312 02/18/23 0447 02/19/23 0453  NA 137 132* 133* 137  K 4.1 3.9 3.6 3.7  CL 94* 94* 98 96*  CO2 22 29 30 31   GLUCOSE 216* 216* 111* 122*  BUN 22 27* 24* 19  CREATININE 1.09 1.11 1.00 0.97  CALCIUM 8.9 8.6* 8.4* 8.8*  MG  --   --   --  2.3   Liver Function Tests: Recent Labs  Lab 02/17/23 1312  AST 13*  ALT 11  ALKPHOS 86  BILITOT 0.3  PROT 7.3  ALBUMIN 3.3*   No results for input(s): "LIPASE", "AMYLASE" in the last 168 hours. No results for input(s): "AMMONIA" in the last 168 hours. CBC: Recent Labs  Lab 02/13/23  1233 02/17/23 1049 02/17/23 1312 02/17/23 1941 02/18/23 0447 02/19/23 0453  WBC 12.0* 10.9* 10.6*  --  8.9 10.5  NEUTROABS 10.2* 9.3* 8.9*  --   --   --   HGB 8.3* 7.1* 7.3* 8.7* 8.0* 8.3*  HCT 28.7* 24.3* 25.0* 29.4* 27.7*  27.9*  MCV 91 91 90.6  --  89.1 90.0  PLT 436 444 422*  --  444* 455*   Cardiac Enzymes: No results for input(s): "CKTOTAL", "CKMB", "CKMBINDEX", "TROPONINI" in the last 168 hours. BNP: Invalid input(s): "POCBNP" CBG: Recent Labs  Lab 02/18/23 1838 02/18/23 2337 02/19/23 0606 02/19/23 1104 02/19/23 1626  GLUCAP 112* 131* 128* 114* 123*   D-Dimer No results for input(s): "DDIMER" in the last 72 hours. Hgb A1c No results for input(s): "HGBA1C" in the last 72 hours. Lipid Profile No results for input(s): "CHOL", "HDL", "LDLCALC", "TRIG", "CHOLHDL", "LDLDIRECT" in the last 72 hours. Thyroid function studies No results for input(s): "TSH", "T4TOTAL", "T3FREE", "THYROIDAB" in the last 72 hours.  Invalid input(s): "FREET3" Anemia work up Recent Labs    02/17/23 1312  VITAMINB12 268  FOLATE 13.0  FERRITIN 10*  TIBC 541*  IRON 20*  RETICCTPCT 3.0   Urinalysis    Component Value Date/Time   COLORURINE STRAW (A) 02/17/2023 1301   APPEARANCEUR CLEAR 02/17/2023 1301   APPEARANCEUR Clear 10/31/2022 0958   LABSPEC 1.008 02/17/2023 1301   PHURINE 6.0 02/17/2023 1301   GLUCOSEU NEGATIVE 02/17/2023 1301   HGBUR NEGATIVE 02/17/2023 1301   BILIRUBINUR NEGATIVE 02/17/2023 1301   BILIRUBINUR Negative 10/31/2022 0958   KETONESUR NEGATIVE 02/17/2023 1301   PROTEINUR NEGATIVE 02/17/2023 1301   NITRITE NEGATIVE 02/17/2023 1301   LEUKOCYTESUR NEGATIVE 02/17/2023 1301   Sepsis Labs Recent Labs  Lab 02/17/23 1049 02/17/23 1312 02/18/23 0447 02/19/23 0453  WBC 10.9* 10.6* 8.9 10.5   Microbiology No results found for this or any previous visit (from the past 240 hour(s)).   Time coordinating discharge: 35 minutes  SIGNED:   Erick Blinks, DO [facility] 02/19/2023, 5:08 PM  If 7PM-7AM, please contact night-coverage [email or web address]

## 2023-02-19 NOTE — Plan of Care (Signed)
Rested well during the night. No c/o pain. NPO status after midnight maintained for [place] Capsule Study this morning. Problem: Education: Goal: Knowledge of General Education information will improve Description: Including pain rating scale, medication(s)/side effects and non-pharmacologic comfort measures Outcome: Progressing   Problem: Health Behavior/Discharge Planning: Goal: Ability to manage health-related needs will improve Outcome: Progressing   Problem: Clinical Measurements: Goal: Ability to maintain clinical measurements within normal limits will improve Outcome: Progressing Goal: Will remain free from infection Outcome: Progressing Goal: Diagnostic test results will improve Outcome: Progressing Goal: Respiratory complications will improve Outcome: Progressing Goal: Cardiovascular complication will be avoided Outcome: Progressing   Problem: Activity: Goal: Risk for activity intolerance will decrease Outcome: Progressing   Problem: Nutrition: Goal: Adequate nutrition will be maintained Outcome: Progressing   Problem: Coping: Goal: Level of anxiety will decrease Outcome: Progressing   Problem: Elimination: Goal: Will not experience complications related to bowel motility Outcome: Progressing Goal: Will not experience complications related to urinary retention Outcome: Progressing   Problem: Pain Management: Goal: General experience of comfort will improve Outcome: Progressing   Problem: Safety: Goal: Ability to remain free from injury will improve Outcome: Progressing   Problem: Skin Integrity: Goal: Risk for impaired skin integrity will decrease Outcome: Progressing   Problem: Education: Goal: Ability to describe self-care measures that may prevent or decrease complications (Diabetes Survival Skills Education) will improve Outcome: Progressing Goal: Individualized Educational Video(s) Outcome: Progressing   Problem: Coping: Goal: Ability to adjust  to condition or change in health will improve Outcome: Progressing   Problem: Fluid Volume: Goal: Ability to maintain a balanced intake and output will improve Outcome: Progressing   Problem: Health Behavior/Discharge Planning: Goal: Ability to identify and utilize available resources and services will improve Outcome: Progressing Goal: Ability to manage health-related needs will improve Outcome: Progressing   Problem: Metabolic: Goal: Ability to maintain appropriate glucose levels will improve Outcome: Progressing   Problem: Nutritional: Goal: Maintenance of adequate nutrition will improve Outcome: Progressing Goal: Progress toward achieving an optimal weight will improve Outcome: Progressing   Problem: Skin Integrity: Goal: Risk for impaired skin integrity will decrease Outcome: Progressing   Problem: Tissue Perfusion: Goal: Adequacy of tissue perfusion will improve Outcome: Progressing

## 2023-02-19 NOTE — Plan of Care (Signed)
  Problem: Education: Goal: Knowledge of General Education information will improve Description: Including pain rating scale, medication(s)/side effects and non-pharmacologic comfort measures Outcome: Progressing   Problem: Clinical Measurements: Goal: Diagnostic test results will improve Outcome: Progressing   Problem: Nutrition: Goal: Adequate nutrition will be maintained Outcome: Progressing   

## 2023-02-20 ENCOUNTER — Telehealth: Payer: Self-pay | Admitting: *Deleted

## 2023-02-20 NOTE — Transitions of Care (Post Inpatient/ED Visit) (Addendum)
02/20/2023  Name: John [name] MRN: [id number] DOB: [birth year]  Today's TOC FU Call Status: Today's TOC FU Call Status:: Successful TOC FU Call Completed TOC FU Call Complete Date: 02/20/23 Patient's Name and Date of Birth confirmed.  Transition Care Management Follow-up Telephone Call Date of Discharge: 02/19/23 [facility] Type of Discharge: Inpatient Admission Primary Inpatient Discharge Diagnosis:: Symptomatic anemia How have you been since you were released from the hospital?:  (pt reports he feels much better since dishcarge, has friend that is with him in the home, pt reports he is independent with all aspects of his care) Any questions or concerns?: No  Items Reviewed: Did you receive and understand the discharge instructions provided?:  (Reviewed signs/ symptoms GI bleeding/ melena and importance of reporting to PCP/ GI, pt verbalizes understanding and states he has had no dark stools/ GI bleeding since discharge.) Medications obtained,verified, and reconciled?:  (pt reports he will pickup Ferrous Sulfate and Pepcid at [facility], states he has not started back taking plavix but will resume and plans to talk to doctor about long range plan for plavix) Any new allergies since your discharge?: No Dietary orders reviewed?: Yes Type of Diet Ordered:: low sodium,  heart healthy Do you have support at home?: Yes People in Home: significant other Name of Support/Comfort Primary Source: Sissy Hoff  HCPOA  Medications Reviewed Today: Medications Reviewed Today     Reviewed by Audrie Gallus, RN (Registered Nurse) on 02/20/23 at 1152  Med List Status: <None>   Medication Order Taking? Sig Documenting Provider Last Dose Status Informant  albuterol (VENTOLIN HFA) 108 (90 Base) MCG/ACT inhaler [id number] Yes INHALE 2 PUFFS INTO THE LUNGS EVERY 6 HOURS AS NEEDED FOR WHEEZE OR SHORTNESS OF BREATH Hawks, Christy A, FNP Taking Active Self, Spouse/Significant Other   allopurinol (ZYLOPRIM) 300 MG tablet [id number] Yes Take 1 tablet (300 mg total) by mouth daily. Junie Spencer, FNP Taking Active Self, Spouse/Significant Other  aspirin EC 81 MG tablet [id number] No Take 1 tablet (81 mg total) by mouth daily. Swallow whole.  Patient not taking: Reported on 02/20/2023   Lanier Prude, MD Not Taking Active Self, Spouse/Significant Other           Med Note Elesa Massed, Devona Konig Feb 17, 2023  4:57 PM) Pt stated the Dr took him off this med this morning  Blood Glucose Monitoring Suppl (ONETOUCH VERIO REFLECT) w/Device KIT [id number] Yes Use to test blood sugars daily as directed. DX: E11.9 Junie Spencer, FNP Taking Active Self, Spouse/Significant Other  clopidogrel (PLAVIX) 75 MG tablet [id number] No Take 1 tablet (75 mg total) by mouth daily.  Patient not taking: Reported on 02/20/2023   Cephus Shelling, MD Not Taking Active Spouse/Significant Other           Med Note Elesa Massed, Devona Konig Feb 17, 2023  5:08 PM) Pt stated the Dr took him off this medication today and he forgot to take it the night before.  Continuous Glucose Receiver (FREESTYLE LIBRE 3 READER) DEVI [id number] Yes 1 Device by Does not apply route every 14 (fourteen) days. Junie Spencer, FNP Taking Active Self, Spouse/Significant Other  Continuous Glucose Sensor (FREESTYLE LIBRE 3 SENSOR) [place] [id number] Yes 1 Device by Does not apply route continuous. Junie Spencer, FNP Taking Active Self, Spouse/Significant Other  docusate sodium (COLACE) 100 MG capsule [id number] Yes Take 1 capsule (100 mg total) by mouth 2 (two) times daily. Sherryll Burger,  Pratik D, DO Taking Active   dutasteride (AVODART) 0.5 MG capsule [id number] Yes Take 1 capsule (0.5 mg total) by mouth daily. McKenzie, Mardene Celeste, MD Taking Active Self, Spouse/Significant Other  famotidine (PEPCID) 20 MG tablet [id number] No Take 1 tablet (20 mg total) by mouth daily.  Patient not taking: Reported on 02/20/2023   Maurilio Lovely D, DO Not  Taking Active   ferrous sulfate 325 (65 FE) MG EC tablet [id number] No Take 1 tablet (325 mg total) by mouth 2 (two) times daily.  Patient not taking: Reported on 02/20/2023   Maurilio Lovely D, DO Not Taking Active   Fluticasone-Umeclidin-[facility] ELLIPTA) 100-62.5-25 MCG/ACT AEPB [id number] Yes Inhale 1 puff into the lungs daily. Junie Spencer, FNP Taking Active Self, Spouse/Significant Other  gabapentin (NEURONTIN) 300 MG capsule [id number] Yes TAKE 1 CAPSULE BY MOUTH THREE TIMES A DAY Hawks, Christy A, FNP Taking Active Self, Spouse/Significant Other  insulin degludec (TRESIBA FLEXTOUCH) 200 UNIT/ML FlexTouch Pen [id number] Yes Inject 16 Units into the skin every evening. Dani Gobble, NP Taking Active Self, Spouse/Significant Other  Insulin Pen Needle (BD PEN NEEDLE NANO U/F) 32G X 4 MM MISC [id number] Yes Use to inject insulin once daily Dani Gobble, NP Taking Active Self, Spouse/Significant Other  levothyroxine (SYNTHROID) 50 MCG tablet [id number] Yes TAKE 1 TABLET BY MOUTH EVERY DAY Hawks, Christy A, FNP Taking Active Self, Spouse/Significant Other  linaclotide (LINZESS) 72 MCG capsule [id number] Yes Take 1 capsule (72 mcg total) by mouth daily before breakfast. Junie Spencer, FNP Taking Active Self, Spouse/Significant Other  NON FORMULARY [id number] Yes CPAP at bedtime [provider] Taking Active Self, Spouse/Significant Other  ondansetron (ZOFRAN) 4 MG tablet [id number] Yes Take 1 tablet (4 mg total) by mouth every 8 (eight) hours as needed for nausea or vomiting. Junie Spencer, FNP Taking Active Self, Spouse/Significant Other  OneTouch Delica Lancets 30G MISC [id number] Yes Use to test blood sugars daily as directed. DX: E11.9 Dani Gobble, NP Taking Active Self, Spouse/Significant Other  OXYGEN [id number] Yes Inhale 2 L into the lungs daily as needed (with Cpap at night). [provider] Taking Active Self, Spouse/Significant Other  pantoprazole  (PROTONIX) 40 MG tablet [id number] Yes Take 1 tablet (40 mg total) by mouth daily. Junie Spencer, FNP Taking Active Self, Spouse/Significant Other  pravastatin (PRAVACHOL) 40 MG tablet [id number] Yes TAKE 1 TABLET (40 MG TOTAL) BY MOUTH ON MONDAYS , WEDNESDAYS, AND FRIDAYS AS DIRECTED Hawks, Christy A, FNP Taking Active Self, Spouse/Significant Other  spironolactone (ALDACTONE) 25 MG tablet [id number] Yes Take 0.5 tablets (12.5 mg total) by mouth daily. Jodelle Gross, NP Taking Active Self, Spouse/Significant Other  torsemide (DEMADEX) 20 MG tablet [id number] Yes Take 1 tablet (20 mg total) by mouth daily.  Patient taking differently: Take 20 mg by mouth 2 (two) times daily.   Junie Spencer, FNP Taking Active Self, Spouse/Significant Other  zolpidem (AMBIEN) 5 MG tablet [id number] Yes Take 1 tablet (5 mg total) by mouth at bedtime as needed for sleep.  Patient taking differently: Take 5 mg by mouth at bedtime.   Junie Spencer, FNP Taking Active Self, Spouse/Significant Other            Home Care and Equipment/Supplies: Were Home Health Services Ordered?: Yes Name of Home Health Agency:: [facility] Has Agency set up a time to come to your home?: No EMR reviewed for Home Health Orders:  (pt reports home health contacted him and will be  visiting next week) Any new equipment or medical supplies ordered?: No  Functional Questionnaire: Do you need assistance with bathing/showering or dressing?: No Do you need assistance with meal preparation?: No Do you need assistance with eating?: No Do you have difficulty maintaining continence: No Do you need assistance with getting out of bed/getting out of a chair/moving?: No Do you have difficulty managing or taking your medications?: No  Follow up appointments reviewed: PCP Follow-up appointment confirmed?: Yes Date of PCP follow-up appointment?:  (@ 210 pm) Follow-up Provider: Jannifer Rodney NP Specialist Hospital Follow-up  appointment confirmed?: No Reason Specialist Follow-Up Not Confirmed: Patient has Specialist Provider Number and will Call for Appointment (Referral placed for Dr. Leone Payor GI- pt verbalizes understanding of phone number on hospital AVS and will call and make appointment) Do you need transportation to your follow-up appointment?: No Do you understand care options if your condition(s) worsen?: Yes-patient verbalized understanding  SDOH Interventions Today    Flowsheet Row Most Recent Value  SDOH Interventions   Food Insecurity Interventions Intervention Not Indicated  Housing Interventions Intervention Not Indicated  Utilities Interventions Intervention Not Indicated       Irving Shows [facility], BSN RN Care Manager/ Transitions of Care [place]/[facility] Population Healthy [phone]

## 2023-02-21 LAB — TYPE AND SCREEN
ABO/RH(D): O POS
Antibody Screen: NEGATIVE
Unit division: 0
Unit division: 0

## 2023-02-21 LAB — BPAM RBC
Blood Product Expiration Date: 202411252359
Blood Product Expiration Date: 202412012359
ISSUE DATE / TIME: 202411121628
Unit Type and Rh: 5100
Unit Type and Rh: 5100

## 2023-02-23 ENCOUNTER — Other Ambulatory Visit (INDEPENDENT_AMBULATORY_CARE_PROVIDER_SITE_OTHER): Payer: Medicare Other

## 2023-02-23 ENCOUNTER — Ambulatory Visit (INDEPENDENT_AMBULATORY_CARE_PROVIDER_SITE_OTHER): Payer: Medicare Other | Admitting: Internal Medicine

## 2023-02-23 ENCOUNTER — Ambulatory Visit (INDEPENDENT_AMBULATORY_CARE_PROVIDER_SITE_OTHER): Payer: Medicare Other

## 2023-02-23 ENCOUNTER — Encounter (HOSPITAL_COMMUNITY): Payer: Self-pay | Admitting: Internal Medicine

## 2023-02-23 VITALS — BP 110/70 | HR 110 | Ht 66.0 in | Wt 152.0 lb

## 2023-02-23 DIAGNOSIS — Z794 Long term (current) use of insulin: Secondary | ICD-10-CM

## 2023-02-23 DIAGNOSIS — E782 Mixed hyperlipidemia: Secondary | ICD-10-CM | POA: Diagnosis not present

## 2023-02-23 DIAGNOSIS — D500 Iron deficiency anemia secondary to blood loss (chronic): Secondary | ICD-10-CM | POA: Diagnosis not present

## 2023-02-23 DIAGNOSIS — J449 Chronic obstructive pulmonary disease, unspecified: Secondary | ICD-10-CM

## 2023-02-23 DIAGNOSIS — I152 Hypertension secondary to endocrine disorders: Secondary | ICD-10-CM

## 2023-02-23 DIAGNOSIS — E1151 Type 2 diabetes mellitus with diabetic peripheral angiopathy without gangrene: Secondary | ICD-10-CM

## 2023-02-23 DIAGNOSIS — K5520 Angiodysplasia of colon without hemorrhage: Secondary | ICD-10-CM

## 2023-02-23 DIAGNOSIS — E1159 Type 2 diabetes mellitus with other circulatory complications: Secondary | ICD-10-CM

## 2023-02-23 DIAGNOSIS — Z9981 Dependence on supplemental oxygen: Secondary | ICD-10-CM | POA: Diagnosis not present

## 2023-02-23 DIAGNOSIS — E1169 Type 2 diabetes mellitus with other specified complication: Secondary | ICD-10-CM | POA: Diagnosis not present

## 2023-02-23 DIAGNOSIS — Z7902 Long term (current) use of antithrombotics/antiplatelets: Secondary | ICD-10-CM

## 2023-02-23 DIAGNOSIS — I480 Paroxysmal atrial fibrillation: Secondary | ICD-10-CM

## 2023-02-23 DIAGNOSIS — I5033 Acute on chronic diastolic (congestive) heart failure: Secondary | ICD-10-CM | POA: Diagnosis not present

## 2023-02-23 DIAGNOSIS — E1142 Type 2 diabetes mellitus with diabetic polyneuropathy: Secondary | ICD-10-CM | POA: Diagnosis not present

## 2023-02-23 LAB — CBC WITH DIFFERENTIAL/PLATELET
Basophils Absolute: 0.1 10*3/uL (ref 0.0–0.1)
Basophils Relative: 0.7 % (ref 0.0–3.0)
Eosinophils Absolute: 0.3 10*3/uL (ref 0.0–0.7)
Eosinophils Relative: 2.6 % (ref 0.0–5.0)
HCT: 29.3 % — ABNORMAL LOW (ref 39.0–52.0)
Hemoglobin: 8.8 g/dL — ABNORMAL LOW (ref 13.0–17.0)
Lymphocytes Relative: 4 % — ABNORMAL LOW (ref 12.0–46.0)
Lymphs Abs: 0.5 10*3/uL — ABNORMAL LOW (ref 0.7–4.0)
MCHC: 30 g/dL (ref 30.0–36.0)
MCV: 85.7 fL (ref 78.0–100.0)
Monocytes Absolute: 0.8 10*3/uL (ref 0.1–1.0)
Monocytes Relative: 6.3 % (ref 3.0–12.0)
Neutro Abs: 10.3 10*3/uL — ABNORMAL HIGH (ref 1.4–7.7)
Neutrophils Relative %: 86.4 % — ABNORMAL HIGH (ref 43.0–77.0)
Platelets: 477 10*3/uL — ABNORMAL HIGH (ref 150.0–400.0)
RBC: 3.42 Mil/uL — ABNORMAL LOW (ref 4.22–5.81)
RDW: 17.2 % — ABNORMAL HIGH (ref 11.5–15.5)
WBC: 11.9 10*3/uL — ABNORMAL HIGH (ref 4.0–10.5)

## 2023-02-23 NOTE — Patient Instructions (Signed)
We have placed an order to [facility] hematology department.  Your provider has requested that you go to the basement level for lab work before leaving today. Press "B" on the elevator. The lab is located at the first door on the left as you exit the elevator.  Due to recent changes in healthcare laws, you may see the results of your imaging and laboratory studies on MyChart before your provider has had a chance to review them.  We understand that in some cases there may be results that are confusing or concerning to you. Not all laboratory results come back in the same time frame and the provider may be waiting for multiple results in order to interpret others.  Please give [place] 48 hours in order for your provider to thoroughly review all the results before contacting the office for clarification of your results.   I appreciate the opportunity to care for you. Stan Head, MD, [facility]

## 2023-02-23 NOTE — Progress Notes (Signed)
KORAY ZAPPONE 77 y.o. Feb 24, 1946 [id number]  Assessment & Plan:   Encounter Diagnoses  Name Primary?   Angiodysplasia of gastrointestinal tract Yes   Iron deficiency anemia due to chronic blood loss    Encounter for long-term use of antiplatelets/antithrombotics - clopidogrel    On home oxygen therapy as needed with CPAP     The patient has had recurrent problems with GI bleeding related to angiodysplasia of the GI tract it has been a longstanding problem.  Initiation of Plavix therapy which is necessary for his peripheral vascular disease certainly may have contributed.  He has had endoscopic ablation of upper GI tract AVMs recently when hospitalized at [facility] (November 13).  Await final report and readout of his capsule endoscopy of the small bowel though.  I do not have high hopes that we will be easy to control his problems with endoscopic therapy as gastrointestinal tract angiodysplasia are often ubiquitous and spread throughout the GI tract that in patients requiring long-term antiplatelet there leaf-like products, that compounds the issue.  He will be important to monitor his hemoglobin and support with iron infusions as best possible.  Referred to hematology in [place] to help manage his he struggles with constipation problems.  Linzess would produce a bowel movement but he did not like the watery diarrhea.  May need to retry Linzess.  He used MiraLAX in the past but not on a regular basis so that is a reasonable option as well.  Orders Placed This Encounter  Procedures   CBC with Differential/Platelet   Ambulatory referral to Hematology / Oncology       Latest Ref Rng & Units 02/23/2023   12:18 PM 02/19/2023    4:53 AM 02/18/2023    4:47 AM  CBC  WBC 4.0 - 10.5 K/uL 11.9  10.5  8.9   Hemoglobin 13.0 - 17.0 g/dL 8.8 Repeated and verified X2.  8.3  8.0   Hematocrit 39.0 - 52.0 % 29.3  27.9  27.7   Platelets 150.0 - 400.0 K/uL 477.0  455  444    Capsule  endoscopy of small bowel 02/19/2023 Findings:  Study was complete with first cecal image noted as below. Two tiny angioectasias notes in proximal small bowel at 5 minutes 40 seconds (1% small bowel progression) and at 7 minutes 17 seconds (23% small bowel progression). Likely lymphangiectasia at 21 minutes and 4 seconds.    First Gastric image:  34 seconds First Duodenal image: 5 minutes 35 seconds First Ileo-Cecal Valve image: 2 hours 14 minutes 37 seconds First Cecal image: 2 hours 15 minutes 39 seconds Gastric Passage time: 5 minutes Small Bowel Passage time:  2 hours 10 minutes   Summary & Recommendations: Two tiny angioectasias noted in proximal small bowel. No small bowel tumors or ulcerations noted. No overt bleeding noted. Suspect IDA secondary to GI bleeding from GI tract angiodysplastic lesions in setting of chronic antiplatelet therapy. Patient would likely benefit from hematology referral for management of IDA. He will continue to follow with his primary gastroenterologist, Dr. Stan Head.     I have called the patient with his CBC results.  I have advised taking ferrous sulfate and following up with hematology for iron support.  We could reconsider another push enteroscopy to try to treat the AVMs found to capsule but not seen in previous push enteroscopy but it is likely he has others as well so we will try to support with chronic parenteral iron first and reserve repeat endoscopic  evaluation and treatment for suspected active bleeding or more significant bleeding.  Subjective:   Chief Complaint: Recurrent anemia from AVMs  HPI 77 year old white man with known gastrointestinal tract angiodysplasia of the upper and lower GI tracts who was admitted to [facility] just within the past week because of heme positive stool and iron deficiency anemia with symptoms.  He also has CHF, a flutter, COPD with as needed oxygen, prior lung cancer treated with radiation.  He had a  admission at [facility] and was discharged on November 14, he received 1 unit of packed red cells.  Hemoglobin 7.1 on admission November 12 and with 1 unit of blood rose to the 8 range.  It was overall stable.  Dr. Jena Gauss performed small bowel endoscopy/enteroscopy exam into the proximal jejunum and found 2 small AVMs in the cardia of the stomach and those were ablated.  The capsule endoscopy procedure was performed but has not been reported.  He is on Plavix status post multiple peripheral vascular disease procedures.  Most recently on October 31 Dr. Chestine Spore performed angioplasty and stent of the right superficial femoral artery and the right external iliac artery.  Plavix and aspirin therapy on board.  He also suffers from chronic constipation and though Linzess promoted defecation he does not like the diarrhea like stools.   Prior GI workup in recent years:  EGD in 09/2020 with a small gastric ulcer and what was thought to be a multifactorial blood loss due to perioperative losses and a hematoma.  Dr. Marina Goodell   Colonoscopy 06/20/2021 after positive Cologuard, cecal AVM and diverticulosis Dr. Marina Goodell   June 2023 hospitalized 09/16/2021 EGD with vascular changes in the esophagus thought secondary to radiation and erythema of the stomach showing PPI effect changes.  Colonoscopy same day and the cecal AVM was ablated.  Diverticulosis also.  Dr. Leone Payor   July 2023 hospitalized at [facility], worsening anemia on Eliquis.  Push enteroscopy with 6 small AVMs in the stomach (body), 4 found in the duodenum and 2 in the proximal jejunum all ablated with APC by Dr. Levon Hedger. Allergies  Allergen Reactions   Sulfa Antibiotics Swelling    Mouth swelling   Atorvastatin Other (See Comments)    Muscle aches - tolerating Pravastatin 40 mg MWF   Jardiance [Empagliflozin] Other (See Comments)    FEELS SLUGGISH, TIRED   Lopressor [Metoprolol] Other (See Comments)    Fatigue   Rosuvastatin Other (See Comments)     Muscle aches - tolerating Pravastatin 40 mg MWF   Doxycycline Nausea Only   Tramadol Itching   Temazepam Other (See Comments)    Made insomnia worse    Current Meds  Medication Sig   albuterol (VENTOLIN HFA) 108 (90 Base) MCG/ACT inhaler INHALE 2 PUFFS INTO THE LUNGS EVERY 6 HOURS AS NEEDED FOR WHEEZE OR SHORTNESS OF BREATH   allopurinol (ZYLOPRIM) 300 MG tablet Take 1 tablet (300 mg total) by mouth daily.   Blood Glucose Monitoring Suppl (ONETOUCH VERIO REFLECT) w/Device KIT Use to test blood sugars daily as directed. DX: E11.9   clopidogrel (PLAVIX) 75 MG tablet Take 1 tablet (75 mg total) by mouth daily.   Continuous Glucose Receiver (FREESTYLE LIBRE 3 READER) DEVI 1 Device by Does not apply route every 14 (fourteen) days.   Continuous Glucose Sensor (FREESTYLE LIBRE 3 SENSOR) MISC 1 Device by Does not apply route continuous.   docusate sodium (COLACE) 100 MG capsule Take 1 capsule (100 mg total) by mouth 2 (two) times  daily.   dutasteride (AVODART) 0.5 MG capsule Take 1 capsule (0.5 mg total) by mouth daily.   famotidine (PEPCID) 20 MG tablet Take 1 tablet (20 mg total) by mouth daily.   Fluticasone-Umeclidin-Vilant (TRELEGY ELLIPTA) 100-62.5-25 MCG/ACT AEPB Inhale 1 puff into the lungs daily.   gabapentin (NEURONTIN) 300 MG capsule TAKE 1 CAPSULE BY MOUTH THREE TIMES A DAY   insulin degludec (TRESIBA FLEXTOUCH) 200 UNIT/ML FlexTouch Pen Inject 16 Units into the skin every evening.   Insulin Pen Needle (BD PEN NEEDLE NANO U/F) 32G X 4 MM MISC Use to inject insulin once daily   levothyroxine (SYNTHROID) 50 MCG tablet TAKE 1 TABLET BY MOUTH EVERY DAY   linaclotide (LINZESS) 72 MCG capsule Take 1 capsule (72 mcg total) by mouth daily before breakfast.   NON FORMULARY CPAP at bedtime   ondansetron (ZOFRAN) 4 MG tablet Take 1 tablet (4 mg total) by mouth every 8 (eight) hours as needed for nausea or vomiting.   OneTouch Delica Lancets 30G MISC Use to test blood sugars daily as directed. DX:  E11.9   OXYGEN Inhale 2 L into the lungs daily as needed (with Cpap at night).   pantoprazole (PROTONIX) 40 MG tablet Take 1 tablet (40 mg total) by mouth daily.   pravastatin (PRAVACHOL) 40 MG tablet TAKE 1 TABLET (40 MG TOTAL) BY MOUTH ON MONDAYS , WEDNESDAYS, AND FRIDAYS AS DIRECTED   spironolactone (ALDACTONE) 25 MG tablet Take 0.5 tablets (12.5 mg total) by mouth daily.   torsemide (DEMADEX) 20 MG tablet Take 1 tablet (20 mg total) by mouth daily. (Patient taking differently: Take 20 mg by mouth 2 (two) times daily.)   zolpidem (AMBIEN) 5 MG tablet Take 1 tablet (5 mg total) by mouth at bedtime as needed for sleep. (Patient taking differently: Take 5 mg by mouth at bedtime.)   Past Medical History:  Diagnosis Date   Adenocarcinoma of lung, right (HCC) 04/18/2016   Anemia    Anxiety    Arthritis    Asthma    Atrial flutter (HCC)    BPH (benign prostatic hyperplasia)    with urinary retention 02/06/20   CHF (congestive heart failure) (HCC)    COPD (chronic obstructive pulmonary disease) (HCC)    Depression    Diabetes mellitus, type II (HCC)    Dyspnea    History of kidney stones    History of radiation therapy    right lung 09/10/2020-09/17/2020   Dr Roselind Messier   History of radiation therapy    Left and right Lung- 06/19/22-06/26/22   Hyperlipidemia    Hypertension    Hypothyroidism    Macular degeneration    Neuropathy    Non-small cell lung cancer, right (HCC) 04/18/2016   Peripheral vascular disease (HCC)    Prostatitis    Pulmonary nodule, left 07/16/2016   Sleep apnea    cpap   Past Surgical History:  Procedure Laterality Date   A-FLUTTER ABLATION N/A 01/13/2022   Procedure: A-FLUTTER ABLATION;  Surgeon: Lanier Prude, MD;  Location: [facility] INVASIVE CV LAB;  Service: Cardiovascular;  Laterality: N/A;   ABDOMINAL AORTOGRAM W/LOWER EXTREMITY Left 02/06/2020   Procedure: ABDOMINAL AORTOGRAM W/LOWER EXTREMITY;  Surgeon: Runell Gess, MD;  Location: [facility] INVAS[facility] CV [facility];   Service: Cardiovascular;  Laterality: Left;   ABDOMINAL AORTOGRAM W/LOWER EXTREMITY N/A 05/30/2021   Procedure: ABDOMINAL AORTOGRAM W/LOWER EXTREMITY;  Surgeon: Cephus Shelling, MD;  Location: [facility] INVAS[facility] CV LAB;  Service: Cardiovascular;  Laterality: N/A;   ABDOMINAL AORTOGRAM  W/LOWER EXTREMITY N/A 02/05/2023   Procedure: ABDOMINAL AORTOGRAM W/LOWER EXTREMITY;  Surgeon: Cephus Shelling, MD;  Location: [facility] INVASIVE CV LAB;  Service: Cardiovascular;  Laterality: N/A;   BIOPSY  09/16/2021   Procedure: BIOPSY;  Surgeon: Iva Boop, MD;  Location: [facility] ENDOSCOPY;  Service: Gastroenterology;;   CARDIOVERSION N/A 10/05/2020   Procedure: CARDIOVERSION;  Surgeon: Sande Rives, MD;  Location: [facility] ENDOSCOPY;  Service: Cardiovascular;  Laterality: N/A;   CATARACT EXTRACTION, BILATERAL Bilateral    COLONOSCOPY WITH PROPOFOL N/A 06/20/2021   Procedure: COLONOSCOPY WITH PROPOFOL;  Surgeon: Hilarie Fredrickson, MD;  Location: [facility] ENDOSCOPY;  Service: Endoscopy;  Laterality: N/A;   COLONOSCOPY WITH PROPOFOL N/A 09/16/2021   Procedure: COLONOSCOPY WITH PROPOFOL;  Surgeon: Iva Boop, MD;  Location: [facility] ENDOSCOPY;  Service: Gastroenterology;  Laterality: N/A;   ENDARTERECTOMY FEMORAL Right 08/20/2020   Procedure: ENDARTERECTOMY  RIGHT FEMORAL ARTERY;  Surgeon: Cephus Shelling, MD;  Location: [facility] OR;  Service: Vascular;  Laterality: Right;   ENTEROSCOPY N/A 10/11/2021   Procedure: ENTEROSCOPY;  Surgeon: Dolores Frame, MD;  Location: [facility] ENDO SU[facility];  Service: Gastroenterology;  Laterality: N/A;   ENTEROSCOPY N/A 02/18/2023   Procedure: ENTEROSCOPY;  Surgeon: Corbin Ade, MD;  Location: [facility] ENDO [facility];  Service: Endoscopy;  Laterality: N/A;  EGD with possible small bowel push enteroscopy   ESOPHAGOGASTRODUODENOSCOPY N/A 09/16/2021   Procedure: ESOPHAGOGASTRODUODENOSCOPY (EGD);  Surgeon: Iva Boop, MD;  Location: [facility] ENDOSCOPY;  Service: Gastroenterology;  Laterality: N/A;    ESOPHAGOGASTRODUODENOSCOPY (EGD) WITH PROPOFOL N/A 09/06/2020   Procedure: ESOPHAGOGASTRODUODENOSCOPY (EGD) WITH PROPOFOL;  Surgeon: Hilarie Fredrickson, MD;  Location: [facility] ENDOSCOPY;  Service: Endoscopy;  Laterality: N/A;   ESOPHAGOGASTRODUODENOSCOPY (EGD) WITH PROPOFOL N/A 10/11/2021   Procedure: ESOPHAGOGASTRODUODENOSCOPY (EGD) WITH PROPOFOL;  Surgeon: Dolores Frame, MD;  [facility]: [facility];  Service: Gastroenterology;  Laterality: N/A;   ESOPHAGOGASTRODUODENOSCOPY (EGD) WITH PROPOFOL N/A 02/18/2023   Procedure: ESOPHAGOGASTRODUODENOSCOPY (EGD) WITH PROPOFOL;  Surgeon: Corbin Ade, MD;  Location: [facility];  Service: Endoscopy;  Laterality: N/A;   GIVENS CAPSULE STUDY N/A 02/19/2023   Procedure: GIVENS CAPSULE STUDY;  Surgeon: Franky Macho, MD;  Location: [facility];  Service: Endoscopy;  Laterality: N/A;   HEMOSTASIS CLIP PLACEMENT  09/16/2021   Procedure: HEMOSTASIS CLIP PLACEMENT;  Surgeon: Iva Boop, MD;  Location: [facility] ENDOSCOPY;  Service: Gastroenterology;;   HOT HEMOSTASIS N/A 09/16/2021   Procedure: HOT HEMOSTASIS (ARGON PLASMA COAGULATION/BICAP);  Surgeon: Iva Boop, MD;  Location: [facility] ENDOSCOPY;  Service: Gastroenterology;  Laterality: N/A;   HOT HEMOSTASIS  10/11/2021   Procedure: HOT HEMOSTASIS (ARGON PLASMA COAGULATION/BICAP);  Surgeon: Marguerita Merles, Reuel Boom, MD;  Location: [facility];  Service: Gastroenterology;;   HOT HEMOSTASIS  02/18/2023   Procedure: HOT HEMOSTASIS (ARGON PLASMA COAGULATION/BICAP);  Surgeon: Corbin Ade, MD;  [facility]: [facility];  Service: Endoscopy;;   INSERTION OF ILIAC STENT Right 08/20/2020   Procedure: RETROGRADE INSERTION OF RIGHT ILIAC STENT;  Surgeon: Cephus Shelling, MD;  Location: [facility] OR;  Service: Vascular;  Laterality: Right;   INTRAOPERATIVE ARTERIOGRAM Right 08/20/2020   Procedure: INTRA OPERATIVE ARTERIOGRAM ILIAC;  Surgeon: Cephus Shelling, MD;  Location: [facility] OR;  Service: Vascular;  Laterality:  Right;   PATCH ANGIOPLASTY Right 08/20/2020   Procedure: PATCH ANGIOPLASTY RIGHT FEMORAL ARTERY;  Surgeon: Cephus Shelling, MD;  Location: [facility] OR;  Service: Vascular;  Laterality: Right;   PERIPHERAL VASCULAR BALLOON ANGIOPLASTY Right 05/30/2021   Procedure: PERIPHERAL  VASCULAR BALLOON ANGIOPLASTY;  Surgeon: Cephus Shelling, MD;  Location: [facility] INVASIVE CV LAB;  Service: Cardiovascular;  Laterality: Right;   PERIPHERAL VASCULAR INTERVENTION Right 02/05/2023   Procedure: PERIPHERAL VASCULAR INTERVENTION;  Surgeon: Cephus Shelling, MD;  Location: [facility];  Service: Cardiovascular;  Laterality: Right;  Rt SFA and Rt Ext Iliac   PORTACATH PLACEMENT Left 06/13/2016   Procedure: INSERTION PORT-A-CATH;  Surgeon: Franky Macho, MD;  Location: [facility] ORS;  Service: General;  Laterality: Left;   TRANSURETHRAL RESECTION OF PROSTATE N/A 05/31/2020   Procedure: TRANSURETHRAL RESECTION OF THE PROSTATE (TURP);  Surgeon: Malen Gauze, MD;  Location: [facility] ORS;  Service: Urology;  Laterality: N/A;   VIDEO BRONCHOSCOPY WITH ENDOBRONCHIAL NAVIGATION N/A 05/28/2016   Procedure: VIDEO BRONCHOSCOPY WITH ENDOBRONCHIAL NAVIGATION;  Surgeon: Loreli Slot, MD;  Location: [facility] OR;  Service: Thoracic;  Laterality: N/A;   VIDEO BRONCHOSCOPY WITH ENDOBRONCHIAL ULTRASOUND N/A 05/28/2016   Procedure: VIDEO BRONCHOSCOPY WITH ENDOBRONCHIAL ULTRASOUND;  Surgeon: Loreli Slot, MD;  Location: [facility] OR;  Service: Thoracic;  Laterality: N/A;   Social History   Social History Narrative   [name] is his POA and lives with him. Children out of town - one in [facility], others in [place].   family history includes Diabetes in his father; Heart disease in his father; Hypertension in his mother and sister; Stroke in his father.   Review of Systems As per HPI  Objective:   Physical Exam BP 110/70   Pulse (!) 110   Ht 5\' 6"  (1.676 m)   Wt 152 lb (68.9 kg)   BMI 24.53 kg/m  Chronically ill wm  NAD  Rectal - small amount tan colored stool

## 2023-02-24 ENCOUNTER — Ambulatory Visit: Payer: Medicare Other | Admitting: Family

## 2023-02-24 ENCOUNTER — Encounter: Payer: Self-pay | Admitting: Family

## 2023-02-24 VITALS — BP 117/66 | HR 117 | Temp 98.3°F | Ht 66.0 in | Wt 153.0 lb

## 2023-02-24 DIAGNOSIS — K5900 Constipation, unspecified: Secondary | ICD-10-CM

## 2023-02-24 DIAGNOSIS — D500 Iron deficiency anemia secondary to blood loss (chronic): Secondary | ICD-10-CM | POA: Diagnosis not present

## 2023-02-24 DIAGNOSIS — Z09 Encounter for follow-up examination after completed treatment for conditions other than malignant neoplasm: Secondary | ICD-10-CM

## 2023-02-24 DIAGNOSIS — Z7902 Long term (current) use of antithrombotics/antiplatelets: Secondary | ICD-10-CM | POA: Diagnosis not present

## 2023-02-24 DIAGNOSIS — D649 Anemia, unspecified: Secondary | ICD-10-CM

## 2023-02-24 DIAGNOSIS — K921 Melena: Secondary | ICD-10-CM | POA: Diagnosis not present

## 2023-02-24 DIAGNOSIS — K31819 Angiodysplasia of stomach and duodenum without bleeding: Secondary | ICD-10-CM | POA: Diagnosis not present

## 2023-02-24 DIAGNOSIS — M150 Primary generalized (osteo)arthritis: Secondary | ICD-10-CM | POA: Diagnosis not present

## 2023-02-24 NOTE — Patient Instructions (Signed)
Iron Deficiency Anemia, Adult  Iron deficiency anemia is a condition in which the concentration of red blood cells or hemoglobin in the blood is below normal because of too little iron. Hemoglobin is a substance in red blood cells that carries oxygen to the body's tissues. When the concentration of red blood cells or hemoglobin is too low, not enough oxygen reaches these tissues. Iron deficiency anemia is usually long-lasting, and it develops over time. It may or may not cause symptoms. It is a common type of anemia. What are the causes? This condition may be caused by: Not enough iron in the diet. Abnormal absorption in the gut. Blood loss. What increases the risk? You are more likely to develop this condition if you get menstrual periods (menstruate) or are pregnant. What are the signs or symptoms? Symptoms of this condition may include: Pale skin, lips, and nail beds. Weakness, dizziness, and getting tired easily. Shortness of breath when moving or exercising. Cold hands or feet. Mild anemia may not cause any symptoms. How is this diagnosed? This condition is diagnosed based on: Your medical history. A physical exam. Blood tests. How is this treated? This condition is treated by correcting the cause of your iron deficiency. Treatment may involve: Adding iron-rich foods to your diet. Taking iron supplements. If you are pregnant or breastfeeding, you may need to take extra iron because your normal diet usually does not provide the amount of iron that you need. Increasing vitamin C intake. Vitamin C helps your body absorb iron. Your health care provider may recommend that you take iron supplements along with a glass of orange juice or a vitamin C supplement. Medicines to make heavy menstrual flow lighter. Surgery or additional testing procedures to determine the cause of your anemia. You may need repeat blood tests to determine whether treatment is working. If the treatment does not  seem to be working, you may need more tests. Follow these instructions at home: Medicines Take over-the-counter and prescription medicines only as told by your health care provider. This includes iron supplements and vitamins. This is important because too much iron can be harmful. For the best iron absorption, you should take iron supplements when your stomach is empty. If you cannot tolerate them on an empty stomach, you may need to take them with food. Do not drink milk or take antacids at the same time as your iron supplements. Milk and antacids may interfere with how your body absorbs iron. Iron supplements may turn stool (feces) a darker color and it may appear black. If you cannot tolerate taking iron supplements by mouth, talk with your health care provider about taking them through an IV or through an injection into a muscle. Eating and drinking Talk with your health care provider before changing your diet. Your provider may recommend that you eat foods that contain a lot of iron, such as: Liver. Low-fat (lean) beef. Breads and cereals that have iron added to them (are fortified). Eggs. Dried fruit. Dark green, leafy vegetables. To help your body use the iron from iron-rich foods, eat those foods at the same time as fresh fruits and vegetables that are high in vitamin C. Foods that are high in vitamin C include: Oranges. Peppers. Tomatoes. Mangoes. Managing constipation If you are taking an iron supplement, it may cause constipation. To prevent or treat constipation, you may need to: Drink enough fluid to keep your urine pale yellow. Take over-the-counter or prescription medicines. Eat foods that are high in fiber, such   as beans, whole grains, and fresh fruits and vegetables. Limit foods that are high in fat and processed sugars, such as fried or sweet foods. General instructions Return to your normal activities as told by your health care provider. Ask your health care provider  what activities are safe for you. Keep all follow-up visits. Contact a health care provider if: You feel nauseous or you vomit. You feel weak. You become light-headed when getting up from a sitting or lying down position. You have unexplained sweating. You develop symptoms of constipation. You have a heaviness in your chest. You have trouble breathing with physical activity. Get help right away if: You faint. If this happens, do not drive yourself to the hospital. You have an irregular or rapid heartbeat. Summary Iron deficiency anemia is a condition in which the concentration of red blood cells or hemoglobin in the blood is below normal because of too little iron. This condition is treated by correcting the cause of your iron deficiency. Take over-the-counter and prescription medicines only as told by your health care provider. This includes iron supplements and vitamins. To help your body use the iron from iron-rich foods, eat those foods at the same time as fresh fruits and vegetables that are high in vitamin C. Seek medical help if you have signs or symptoms of worsening anemia. This information is not intended to replace advice given to you by your health care provider. Make sure you discuss any questions you have with your health care provider. Document Revised: 05/01/2021 Document Reviewed: 05/01/2021 Elsevier Patient Education  2024 Elsevier Inc.  

## 2023-02-24 NOTE — Op Note (Addendum)
  Small Bowel Givens Capsule Study Procedure date:  02/19/23  Referring Provider:  R.Roetta Sessions, MD PCP:  Dr. Lendon Colonel, Edilia Bo, FNP  Indication for procedure: Recurrent GI bleeding/IDA with recurrent melena in setting of ASA and recently starting Plavix. Previously had numerous angiodysplastic lesions treated in both the stomach, duodenum, and jejunum in 10/2021. Single colonic angiodysplastic lesion treated in the colon in 06/2021. This admission, small bowel enteroscopy completed 02/18/23 with 2 tiny AVMs in the gastric cardia, innocent appearing, s/p APC ablation. Examination completed to proximal jejunum.   Patient data:  Wt: 70.3 kg Ht: 5 '6"  Findings:  Study was complete with first cecal image noted as below. Two tiny angioectasias notes in proximal small bowel at 5 minutes 40 seconds (1% small bowel progression) and at 7 minutes 17 seconds (23% small bowel progression). Likely lymphangiectasia at 21 minutes and 4 seconds.   First Gastric image:  34 seconds First Duodenal image: 5 minutes 35 seconds First Ileo-Cecal Valve image: 2 hours 14 minutes 37 seconds First Cecal image: 2 hours 15 minutes 39 seconds Gastric Passage time: 5 minutes Small Bowel Passage time:  2 hours 10 minutes  Summary & Recommendations: Two tiny angioectasias noted in proximal small bowel. No small bowel tumors or ulcerations noted. No overt bleeding noted. Suspect IDA secondary to GI bleeding from GI tract angiodysplastic lesions in setting of chronic antiplatelet therapy. Patient would likely benefit from hematology referral for management of IDA. He will continue to follow with his primary gastroenterologist, Dr. Stan Head.    Attending note: Pertinent images reviewed.  Agree with assessment.

## 2023-02-24 NOTE — Progress Notes (Signed)
Subjective:    Patient ID: John [name], male    DOB: 04-09-1945, 77 y.o.   MRN: [id number]  Chief Complaint  Patient presents with   Transitions Of Care   Today's visit was for Transitional Care Management.  The patient was discharged from [facility] on 02/19/23 with a primary diagnosis of symptomatic anemia.   Contact with the patient and/or caregiver, by a clinical staff member, was made on 02/20/23 and was documented as a telephone encounter within the EMR.  Through chart review and discussion with the patient I have determined that management of their condition is of moderate complexity.   He was seen in the office on 02/17/23 and found to be anemic and sent to the ED. He did a capsule study on 11/14. He was told to resume his Plavix and given ferrous sulfate BID. He was given one PRBC. He had EGD on 02/18/23 .  His Celebrex and aspirin was stopped.    He had Hgb checked yesterday and it was 8.8.  He has referral to hematologists pending.   Continues to have constipation and uses Miralax as needed. Has linzess but causes diarrhea, only uses as needed.  Anemia Presents for follow-up visit. Symptoms include malaise/fatigue and pallor. There has been no light-headedness.  Arthritis Presents for follow-up visit. He complains of pain and stiffness. Affected locations include the right knee and left knee. His pain is at a severity of 10/10.      Review of Systems  Constitutional:  Positive for malaise/fatigue.  Musculoskeletal:  Positive for arthritis and stiffness.  Skin:  Positive for pallor.  Neurological:  Negative for light-headedness.  All other systems reviewed and are negative.  Family History  Problem Relation Age of Onset   Hypertension Mother    Diabetes Father    Heart disease Father    Stroke Father    Hypertension Sister    Sleep apnea Neg Hx    Social History   Socioeconomic History   Marital status: Significant Other    Spouse name: Not on  file   Number of children: 5   Years of education: 10   Highest education level: 10th grade  Occupational History   Occupation: Retired  Tobacco Use   Smoking status: Every Day    Current packs/day: 0.50    Average packs/day: 0.5 packs/day for 62.0 years (31.0 ttl pk-yrs)    Types: Cigarettes    Start date: 03/11/1961   Smokeless tobacco: Never   Tobacco comments:    1/2 pack to 1 pack per day 12/14/19  Vaping Use   Vaping status: Never Used  Substance and Sexual Activity   Alcohol use: No   Drug use: No   Sexual activity: Yes    Birth control/protection: None  Other Topics Concern   Not on file  Social History Narrative   John [name] is his POA and lives with him. Children out of town - one in [facility], others in [place].   Social Determinants of Health   Financial Resource Strain: Low Risk  (11/19/2022)   Overall Financial Resource Strain (CARDIA)    Difficulty of Paying Living Expenses: Not hard at all  Food Insecurity: No Food Insecurity (02/20/2023)   Hunger Vital Sign    Worried About Running Out of Food in the Last Year: Never true    Ran Out of Food in the Last Year: Never true  Transportation Needs: No Transportation Needs (02/20/2023)   PRAPARE - Transportation  Lack of Transportation (Medical): No    Lack of Transportation (Non-Medical): No  Physical Activity: Insufficiently Active (11/19/2022)   Exercise Vital Sign    Days of Exercise per Week: 2 days    Minutes of Exercise per Session: 30 min  Stress: No Stress Concern Present (11/19/2022)   [employer] of Occupational Health - Occupational Stress Questionnaire    Feeling of Stress : Not at all  Social Connections: Socially Isolated (11/19/2022)   Social Connection and Isolation Panel [NHANES]    Frequency of Communication with Friends and Family: More than three times a week    Frequency of Social Gatherings with Friends and Family: Never    Attends Religious Services: Never    Database administrator  or Organizations: No    Attends Banker Meetings: Never    Marital Status: Divorced       Objective:   Physical Exam Vitals reviewed.  Constitutional:      General: He is not in acute distress.    Appearance: He is well-developed.  HENT:     Head: Normocephalic.  Eyes:     General:        Right eye: No discharge.        Left eye: No discharge.     Pupils: Pupils are equal, round, and reactive to light.  Neck:     Thyroid: No thyromegaly.  Cardiovascular:     Rate and Rhythm: Normal rate and regular rhythm.     Heart sounds: Normal heart sounds. No murmur heard. Pulmonary:     Effort: Pulmonary effort is normal. No respiratory distress.     Breath sounds: Normal breath sounds. No wheezing.  Abdominal:     General: Bowel sounds are normal. There is no distension.     Palpations: Abdomen is soft.     Tenderness: There is no abdominal tenderness.  Musculoskeletal:        General: Tenderness (pain in right knee with flexion) present. Normal range of motion.     Cervical back: Normal range of motion and neck supple.  Skin:    General: Skin is warm and dry.     Coloration: Skin is pale.     Findings: No erythema or rash.  Neurological:     Mental Status: He is alert and oriented to person, place, and time.     Cranial Nerves: No cranial nerve deficit.     Motor: Weakness present.     Gait: Gait abnormal.     Deep Tendon Reflexes: Reflexes are normal and symmetric.  Psychiatric:        Behavior: Behavior normal.        Thought Content: Thought content normal.        Judgment: Judgment normal.     BP 117/66   Pulse (!) 117   Temp 98.3 F (36.8 C) (Temporal)   Ht 5\' 6"  (1.676 m)   Wt 153 lb (69.4 kg)   SpO2 97%   BMI 24.69 kg/m        Assessment & Plan:  John [name] comes in today with chief complaint of Transitions Of Care   Diagnosis and orders addressed:  1. Symptomatic anemia  2. Hospital discharge follow-up  3. Primary  osteoarthritis involving multiple joints  4. Constipation, unspecified constipation type  Hgb reviewed from yesterday, trending upward.  Continue iron Continue to hold celebrex and aspirin. Continue Plavix  Keep follow up with GI and hematologists    [facility],  FNP

## 2023-02-24 NOTE — Progress Notes (Signed)
[employer] 

## 2023-02-25 ENCOUNTER — Ambulatory Visit (HOSPITAL_COMMUNITY)
Admit: 2023-02-25 | Discharge: 2023-02-25 | Disposition: A | Discharge status: Home / Self Care | Payer: Medicare Other | Attending: Vascular Surgery

## 2023-02-25 ENCOUNTER — Ambulatory Visit (HOSPITAL_COMMUNITY)
Admit: 2023-02-25 | Discharge: 2023-02-25 | Discharge status: Home / Self Care | Payer: Medicare Other | Attending: Vascular Surgery

## 2023-02-25 ENCOUNTER — Ambulatory Visit (HOSPITAL_COMMUNITY)
Admission: RE | Admit: 2023-02-25 | Discharge: 2023-02-25 | Disposition: A | Discharge status: Home / Self Care | Payer: Medicare Other | Source: Ambulatory Visit | Attending: Vascular Surgery | Admitting: Vascular Surgery

## 2023-02-25 ENCOUNTER — Encounter: Payer: Medicare Other | Admitting: Orthopedic Surgery

## 2023-02-25 DIAGNOSIS — T82398A Other mechanical complication of other vascular grafts, initial encounter: Secondary | ICD-10-CM | POA: Diagnosis not present

## 2023-02-25 DIAGNOSIS — S8992XA Unspecified injury of left lower leg, initial encounter: Secondary | ICD-10-CM | POA: Diagnosis not present

## 2023-02-25 DIAGNOSIS — S8991XA Unspecified injury of right lower leg, initial encounter: Secondary | ICD-10-CM | POA: Diagnosis not present

## 2023-02-25 DIAGNOSIS — I739 Peripheral vascular disease, unspecified: Secondary | ICD-10-CM | POA: Insufficient documentation

## 2023-02-25 DIAGNOSIS — Z95828 Presence of other vascular implants and grafts: Secondary | ICD-10-CM | POA: Diagnosis not present

## 2023-02-25 DIAGNOSIS — L97919 Non-pressure chronic ulcer of unspecified part of right lower leg with unspecified severity: Secondary | ICD-10-CM | POA: Diagnosis not present

## 2023-03-02 DIAGNOSIS — I5033 Acute on chronic diastolic (congestive) heart failure: Secondary | ICD-10-CM | POA: Diagnosis not present

## 2023-03-02 DIAGNOSIS — N138 Other obstructive and reflux uropathy: Secondary | ICD-10-CM | POA: Diagnosis not present

## 2023-03-02 DIAGNOSIS — E039 Hypothyroidism, unspecified: Secondary | ICD-10-CM | POA: Diagnosis not present

## 2023-03-02 DIAGNOSIS — I4891 Unspecified atrial fibrillation: Secondary | ICD-10-CM | POA: Diagnosis not present

## 2023-03-02 DIAGNOSIS — E782 Mixed hyperlipidemia: Secondary | ICD-10-CM | POA: Diagnosis not present

## 2023-03-02 DIAGNOSIS — K8020 Calculus of gallbladder without cholecystitis without obstruction: Secondary | ICD-10-CM | POA: Diagnosis not present

## 2023-03-02 DIAGNOSIS — J4489 Other specified chronic obstructive pulmonary disease: Secondary | ICD-10-CM | POA: Diagnosis not present

## 2023-03-02 DIAGNOSIS — E1169 Type 2 diabetes mellitus with other specified complication: Secondary | ICD-10-CM | POA: Diagnosis not present

## 2023-03-02 DIAGNOSIS — N401 Enlarged prostate with lower urinary tract symptoms: Secondary | ICD-10-CM | POA: Diagnosis not present

## 2023-03-02 DIAGNOSIS — I6521 Occlusion and stenosis of right carotid artery: Secondary | ICD-10-CM | POA: Diagnosis not present

## 2023-03-02 DIAGNOSIS — F419 Anxiety disorder, unspecified: Secondary | ICD-10-CM | POA: Diagnosis not present

## 2023-03-02 DIAGNOSIS — E1151 Type 2 diabetes mellitus with diabetic peripheral angiopathy without gangrene: Secondary | ICD-10-CM | POA: Diagnosis not present

## 2023-03-02 DIAGNOSIS — E1142 Type 2 diabetes mellitus with diabetic polyneuropathy: Secondary | ICD-10-CM | POA: Diagnosis not present

## 2023-03-02 DIAGNOSIS — I4892 Unspecified atrial flutter: Secondary | ICD-10-CM | POA: Diagnosis not present

## 2023-03-02 DIAGNOSIS — E1159 Type 2 diabetes mellitus with other circulatory complications: Secondary | ICD-10-CM | POA: Diagnosis not present

## 2023-03-02 DIAGNOSIS — Z48812 Encounter for surgical aftercare following surgery on the circulatory system: Secondary | ICD-10-CM | POA: Diagnosis not present

## 2023-03-02 DIAGNOSIS — F17210 Nicotine dependence, cigarettes, uncomplicated: Secondary | ICD-10-CM | POA: Diagnosis not present

## 2023-03-02 DIAGNOSIS — M150 Primary generalized (osteo)arthritis: Secondary | ICD-10-CM | POA: Diagnosis not present

## 2023-03-02 DIAGNOSIS — D62 Acute posthemorrhagic anemia: Secondary | ICD-10-CM | POA: Diagnosis not present

## 2023-03-02 DIAGNOSIS — I152 Hypertension secondary to endocrine disorders: Secondary | ICD-10-CM | POA: Diagnosis not present

## 2023-03-02 DIAGNOSIS — F32A Depression, unspecified: Secondary | ICD-10-CM | POA: Diagnosis not present

## 2023-03-02 DIAGNOSIS — F5101 Primary insomnia: Secondary | ICD-10-CM | POA: Diagnosis not present

## 2023-03-02 DIAGNOSIS — C3491 Malignant neoplasm of unspecified part of right bronchus or lung: Secondary | ICD-10-CM | POA: Diagnosis not present

## 2023-03-02 DIAGNOSIS — I700 Atherosclerosis of aorta: Secondary | ICD-10-CM | POA: Diagnosis not present

## 2023-03-02 DIAGNOSIS — R338 Other retention of urine: Secondary | ICD-10-CM | POA: Diagnosis not present

## 2023-03-04 ENCOUNTER — Encounter: Payer: Self-pay | Admitting: Orthopedic Surgery

## 2023-03-04 ENCOUNTER — Ambulatory Visit: Payer: Medicare Other | Admitting: Orthopedic Surgery

## 2023-03-04 ENCOUNTER — Other Ambulatory Visit (INDEPENDENT_AMBULATORY_CARE_PROVIDER_SITE_OTHER): Payer: Medicare Other

## 2023-03-04 VITALS — BP 102/57 | HR 118 | Ht 66.0 in | Wt 157.4 lb

## 2023-03-04 DIAGNOSIS — M25561 Pain in right knee: Secondary | ICD-10-CM | POA: Diagnosis not present

## 2023-03-04 NOTE — Patient Instructions (Signed)
Voltaren or diclofenac gel  Rub this on the part of the knee that is painful, 3-4 times a day  If pain worsens, you can call the clinic for an appointment for an injection

## 2023-03-04 NOTE — Progress Notes (Signed)
Orthopaedic Clinic Return  Assessment: John [name] is a 77 y.o. male with the following: Acute right knee pain  Plan: Acute knee pain, no specific injury. Was using a walker, but now using a cane.  Knee pain is improving Minimal swelling Localizes pain to quadriceps tendon Recent steroid injection If improving, nothing further needed at this time Continue [facility] PT   Follow-up: Return if symptoms worsen or fail to improve.   Subjective:  Chief Complaint  Patient presents with   Knee Pain    R/hurting for about a month. I did have to use a walker at one time    History of Present Illness: John [name] is a 77 y.o. male who returns to clinic for evaluation of right knee pain.  Pain started a month ago.  No specific injury.  Pain was severe, required a walker.  Now using a cane.  Steroid injection 2-3 weeks ago without obvious improvement.  No swelling.  Tylenol otherwise.   Review of Systems: No fevers or chills No numbness or tingling No chest pain No shortness of breath No bowel or bladder dysfunction No GI distress No headaches   Objective: BP (!) 102/57   Pulse (!) 118   Ht 5\' 6"  (1.676 m)   Wt 157 lb 6 oz (71.4 kg)   BMI 25.40 kg/m   Physical Exam:  Elderly male.  Alert and oriented.  No acute distress.  Right knee without swelling.  Minimal tenderness palpation along the medial or lateral joint line.  Healed superficial lacerations, with residual scarring.  Quadricep tendon is intact.  Patellar tendon is intact.  No redness.  Mild tenderness to palpation over the quadriceps tendon.  IMAGING: I personally ordered and reviewed the following images:  X-rays of the right knee were obtained in clinic today.  No acute injuries.  Neutral overall alignment.  Mild to moderate loss of joint space.  Minimal osteophytes.  No bony lesions.  Impression: Negative right knee x-ray Oliver Barre, MD 03/04/2023 11:30 PM

## 2023-03-06 ENCOUNTER — Other Ambulatory Visit: Payer: Self-pay | Admitting: Family

## 2023-03-06 DIAGNOSIS — E1142 Type 2 diabetes mellitus with diabetic polyneuropathy: Secondary | ICD-10-CM

## 2023-03-08 DIAGNOSIS — I739 Peripheral vascular disease, unspecified: Secondary | ICD-10-CM

## 2023-03-08 HISTORY — DX: Peripheral vascular disease, unspecified: I73.9

## 2023-03-09 NOTE — Progress Notes (Unsigned)
Patient name: John [name] MRN: [id number] DOB: 05-27-1945 Sex: male  REASON FOR VISIT: Follow-up right leg PAD  HPI: John [name] is a 77 y.o. male with multiple medical comorbidities including hypertension, hyperlipidemia, diabetes, COPD, CHF, tobacco abuse that presents for follow-up of right leg PAD.    Most recently on 02/05/2023 he underwent angioplasty and stenting of his right SFA as well as angioplasty and stenting of his right external iliac artery.  This was for recurrent flow-limiting stenosis with disabling right leg claudication symptoms.  He states his right leg feels better.  States he did have a GI bleed requiring capsule study.  States he was told he had 2 small angiectasia's.  Has been taken off his aspirin but taking Plavix.  Initially underwent right common iliac stent with a right femoral endarterectomy on 08/20/2020 for short distance lifestyle limiting claudication.  He then developed recurrent symptoms in the right leg.  On 05/30/2021 he underwent right proximal common iliac artery angioplasty with drug-coated balloon, multiple right SFA and above-knee popliteal angioplasty with drug-coated balloon.    Past Medical History:  Diagnosis Date   Adenocarcinoma of lung, right (HCC) 04/18/2016   Anemia    Anxiety    Arthritis    Asthma    Atrial flutter (HCC)    BPH (benign prostatic hyperplasia)    with urinary retention 02/06/20   CHF (congestive heart failure) (HCC)    COPD (chronic obstructive pulmonary disease) (HCC)    Depression    Diabetes mellitus, type II (HCC)    Dyspnea    History of kidney stones    History of radiation therapy    right lung 09/10/2020-09/17/2020   Dr Roselind Messier   History of radiation therapy    Left and right Lung- 06/19/22-06/26/22   Hyperlipidemia    Hypertension    Hypothyroidism    Macular degeneration    Neuropathy    Non-small cell lung cancer, right (HCC) 04/18/2016   Peripheral vascular disease (HCC)    Prostatitis     Pulmonary nodule, left 07/16/2016   Sleep apnea    cpap    Past Surgical History:  Procedure Laterality Date   A-FLUTTER ABLATION N/A 01/13/2022   Procedure: A-FLUTTER ABLATION;  Surgeon: Lanier Prude, MD;  Location: [facility];  Service: Cardiovascular;  Laterality: N/A;   ABDOMINAL AORTOGRAM W/LOWER EXTREMITY Left 02/06/2020   Procedure: ABDOMINAL AORTOGRAM W/LOWER EXTREMITY;  Surgeon: Runell Gess, MD;  Location: [facility];  Service: Cardiovascular;  Laterality: Left;   ABDOMINAL AORTOGRAM W/LOWER EXTREMITY N/A 05/30/2021   Procedure: ABDOMINAL AORTOGRAM W/LOWER EXTREMITY;  Surgeon: Cephus Shelling, MD;  Location: [facility];  Service: Cardiovascular;  Laterality: N/A;   ABDOMINAL AORTOGRAM W/LOWER EXTREMITY N/A 02/05/2023   Procedure: ABDOMINAL AORTOGRAM W/LOWER EXTREMITY;  Surgeon: Cephus Shelling, MD;  Location: [facility];  Service: Cardiovascular;  Laterality: N/A;   BIOPSY  09/16/2021   Procedure: BIOPSY;  Surgeon: Iva Boop, MD;  Location: [facility] ENDOSCOPY;  Service: Gastroenterology;;   CARDIOVERSION N/A 10/05/2020   Procedure: CARDIOVERSION;  Surgeon: Sande Rives, MD;  Location: [facility] ENDOSCOPY;  Service: Cardiovascular;  Laterality: N/A;   CATARACT EXTRACTION, BILATERAL Bilateral    COLONOSCOPY WITH PROPOFOL N/A 06/20/2021   Procedure: COLONOSCOPY WITH PROPOFOL;  Surgeon: Hilarie Fredrickson, MD;  Location: [facility] ENDOSCOPY;  Service: Endoscopy;  Laterality: N/A;   COLONOSCOPY WITH PROPOFOL N/A 09/16/2021   Procedure: COLONOSCOPY WITH PROPOFOL;  Surgeon: Stan Head  E, MD;  Location: [facility] ENDOSCOPY;  Service: Gastroenterology;  Laterality: N/A;   ENDARTERECTOMY FEMORAL Right 08/20/2020   Procedure: ENDARTERECTOMY  RIGHT FEMORAL ARTERY;  Surgeon: Cephus Shelling, MD;  Location: [facility] OR;  Service: Vascular;  Laterality: Right;   ENTEROSCOPY N/A 10/11/2021   Procedure: ENTEROSCOPY;  Surgeon: Dolores Frame, MD;  Location: [facility]  ENDO SUITE;  Service: Gastroenterology;  Laterality: N/A;   ENTEROSCOPY N/A 02/18/2023   Procedure: ENTEROSCOPY;  Surgeon: Corbin Ade, MD;  Location: [facility] ENDO [facility];  Service: Endoscopy;  Laterality: N/A;  EGD with possible small bowel push enteroscopy   ESOPHAGOGASTRODUODENOSCOPY N/A 09/16/2021   Procedure: ESOPHAGOGASTRODUODENOSCOPY (EGD);  Surgeon: Iva Boop, MD;  Location: [facility] ENDOSCOPY;  Service: Gastroenterology;  Laterality: N/A;   ESOPHAGOGASTRODUODENOSCOPY (EGD) WITH PROPOFOL N/A 09/06/2020   Procedure: ESOPHAGOGASTRODUODENOSCOPY (EGD) WITH PROPOFOL;  Surgeon: Hilarie Fredrickson, MD;  Location: [facility] ENDOSCOPY;  Service: Endoscopy;  Laterality: N/A;   ESOPHAGOGASTRODUODENOSCOPY (EGD) WITH PROPOFOL N/A 10/11/2021   Procedure: ESOPHAGOGASTRODUODENOSCOPY (EGD) WITH PROPOFOL;  Surgeon: Dolores Frame, MD;  [facility]: [facility];  Service: Gastroenterology;  Laterality: N/A;   ESOPHAGOGASTRODUODENOSCOPY (EGD) WITH PROPOFOL N/A 02/18/2023   Procedure: ESOPHAGOGASTRODUODENOSCOPY (EGD) WITH PROPOFOL;  Surgeon: Corbin Ade, MD;  Location: [facility];  Service: Endoscopy;  Laterality: N/A;   GIVENS CAPSULE STUDY N/A 02/19/2023   Procedure: GIVENS CAPSULE STUDY;  Surgeon: Franky Macho, MD;  Location: [facility];  Service: Endoscopy;  Laterality: N/A;   HEMOSTASIS CLIP PLACEMENT  09/16/2021   Procedure: HEMOSTASIS CLIP PLACEMENT;  Surgeon: Iva Boop, MD;  Location: [facility] ENDOSCOPY;  Service: Gastroenterology;;   HOT HEMOSTASIS N/A 09/16/2021   Procedure: HOT HEMOSTASIS (ARGON PLASMA COAGULATION/BICAP);  Surgeon: Iva Boop, MD;  Location[facility] ENDOSCOPY;  Service: Gastroenterology;  Laterality: N/A;   HOT HEMOSTASIS  10/11/2021   Procedure: HOT HEMOSTASIS (ARGON PLASMA COAGULATION/BICAP);  Surgeon: Marguerita Merles, Reuel Boom, MD;  Location: [facility];  Service: Gastroenterology;;   HOT HEMOSTASIS  02/18/2023   Procedure: HOT HEMOSTASIS (ARGON PLASMA COAGULATION/BICAP);   Surgeon: Corbin Ade, MD;  Location: [facility];  Service: Endoscopy;;   INSERTION OF ILIAC STENT Right 08/20/2020   Procedure: RETROGRADE INSERTION OF RIGHT ILIAC STENT;  Surgeon: Cephus Shelling, MD;  Location: [facility] OR;  Service: Vascular;  Laterality: Right;   INTRAOPERATIVE ARTERIOGRAM Right 08/20/2020   Procedure: INTRA OPERATIVE ARTERIOGRAM ILIAC;  Surgeon: Cephus Shelling, MD;  Location: [facility] OR;  Service: Vascular;  Laterality: Right;   PATCH ANGIOPLASTY Right 08/20/2020   Procedure: PATCH ANGIOPLASTY RIGHT FEMORAL ARTERY;  Surgeon: Cephus Shelling, MD;  Location: [facility] OR;  Service: Vascular;  Laterality: Right;   PERIPHERAL VASCULAR BALLOON ANGIOPLASTY Right 05/30/2021   Procedure: PERIPHERAL VASCULAR BALLOON ANGIOPLASTY;  Surgeon: Cephus Shelling, MD;  [facility];  Service: Cardiovascular;  Laterality: Right;   PERIPHERAL VASCULAR INTERVENTION Right 02/05/2023   Procedure: PERIPHERAL VASCULAR INTERVENTION;  Surgeon: Cephus Shelling, MD;  Location: [facility];  Service: Cardiovascular;  Laterality: Right;  Rt SFA and Rt Ext Iliac   PORTACATH PLACEMENT Left 06/13/2016   Procedure: INSERTION PORT-A-CATH;  Surgeon: Franky Macho, MD;  Location: [facility] ORS;  Service: General;  Laterality: Left;   TRANSURETHRAL RESECTION OF PROSTATE N/A 05/31/2020   Procedure: TRANSURETHRAL RESECTION OF THE PROSTATE (TURP);  Surgeon: Malen Gauze, MD;  Location: [facility] ORS;  Service: Urology;  Laterality: N/A;   VIDEO BRONCHOSCOPY WITH ENDOBRONCHIAL NAVIGATION N/A 05/28/2016  Procedure: VIDEO BRONCHOSCOPY WITH ENDOBRONCHIAL NAVIGATION;  Surgeon: Loreli Slot, MD;  Location: [facility] OR;  Service: Thoracic;  Laterality: N/A;   VIDEO BRONCHOSCOPY WITH ENDOBRONCHIAL ULTRASOUND N/A 05/28/2016   Procedure: VIDEO BRONCHOSCOPY WITH ENDOBRONCHIAL ULTRASOUND;  Surgeon: Loreli Slot, MD;  Location: [facility] OR;  Service: Thoracic;  Laterality: N/A;    Family History   Problem Relation Age of Onset   Hypertension Mother    Diabetes Father    Heart disease Father    Stroke Father    Hypertension Sister    Sleep apnea Neg Hx     SOCIAL HISTORY: Social History   Tobacco Use   Smoking status: Every Day    Current packs/day: 0.50    Average packs/day: 0.5 packs/day for 62.0 years (31.0 ttl pk-yrs)    Types: Cigarettes    Start date: 03/11/1961   Smokeless tobacco: Never   Tobacco comments:    1/2 pack to 1 pack per day 12/14/19  Substance Use Topics   Alcohol use: No    Allergies  Allergen Reactions   Sulfa Antibiotics Swelling    Mouth swelling   Atorvastatin Other (See Comments)    Muscle aches - tolerating Pravastatin 40 mg MWF   Jardiance [Empagliflozin] Other (See Comments)    FEELS SLUGGISH, TIRED   Lopressor [Metoprolol] Other (See Comments)    Fatigue   Rosuvastatin Other (See Comments)    Muscle aches - tolerating Pravastatin 40 mg MWF   Doxycycline Nausea Only   Tramadol Itching   Temazepam Other (See Comments)    Made insomnia worse     Current Outpatient Medications  Medication Sig Dispense Refill   albuterol (VENTOLIN HFA) 108 (90 Base) MCG/ACT inhaler INHALE 2 PUFFS INTO THE LUNGS EVERY 6 HOURS AS NEEDED FOR WHEEZE OR SHORTNESS OF BREATH 8.5 each 5   allopurinol (ZYLOPRIM) 300 MG tablet TAKE 1 TABLET BY MOUTH EVERY DAY 90 tablet 0   aspirin EC 81 MG tablet Take 1 tablet (81 mg total) by mouth daily. Swallow whole. 90 tablet 3   Blood Glucose Monitoring Suppl (ONETOUCH VERIO REFLECT) w/Device KIT Use to test blood sugars daily as directed. DX: E11.9 1 kit 1   clopidogrel (PLAVIX) 75 MG tablet Take 1 tablet (75 mg total) by mouth daily. 30 tablet 11   Continuous Glucose Receiver (FREESTYLE LIBRE 3 READER) DEVI 1 Device by Does not apply route every 14 (fourteen) days. 1 each 3   Continuous Glucose Sensor (FREESTYLE LIBRE 3 SENSOR) MISC 1 Device by Does not apply route continuous. 1 each 2   docusate sodium (COLACE) 100 MG  capsule Take 1 capsule (100 mg total) by mouth 2 (two) times daily. 60 capsule 2   dutasteride (AVODART) 0.5 MG capsule Take 1 capsule (0.5 mg total) by mouth daily. 90 capsule 3   famotidine (PEPCID) 20 MG tablet Take 1 tablet (20 mg total) by mouth daily. 30 tablet 1   ferrous sulfate 325 (65 FE) MG EC tablet Take 1 tablet (325 mg total) by mouth 2 (two) times daily. 60 tablet 3   Fluticasone-Umeclidin-Vilant (TRELEGY ELLIPTA) 100-62.5-25 MCG/ACT AEPB Inhale 1 puff into the lungs daily. 1 each 11   gabapentin (NEURONTIN) 300 MG capsule TAKE 1 CAPSULE BY MOUTH THREE TIMES A DAY 270 capsule 0   insulin degludec (TRESIBA FLEXTOUCH) 200 UNIT/ML FlexTouch Pen Inject 16 Units into the skin every evening. 12 mL 3   Insulin Pen Needle (BD PEN NEEDLE NANO U/F) 32G X 4 MM MISC  Use to inject insulin once daily 100 each 3   levothyroxine (SYNTHROID) 50 MCG tablet TAKE 1 TABLET BY MOUTH EVERY DAY 90 tablet 1   linaclotide (LINZESS) 72 MCG capsule Take 1 capsule (72 mcg total) by mouth daily before breakfast. 90 capsule 1   NON FORMULARY CPAP at bedtime     ondansetron (ZOFRAN) 4 MG tablet Take 1 tablet (4 mg total) by mouth every 8 (eight) hours as needed for nausea or vomiting. 90 tablet 1   OneTouch Delica Lancets 30G MISC Use to test blood sugars daily as directed. DX: E11.9 100 each 1   OXYGEN Inhale 2 L into the lungs daily as needed (with Cpap at night).     pantoprazole (PROTONIX) 40 MG tablet Take 1 tablet (40 mg total) by mouth daily. 30 tablet 3   pravastatin (PRAVACHOL) 40 MG tablet TAKE 1 TABLET (40 MG TOTAL) BY MOUTH ON MONDAYS , WEDNESDAYS, AND FRIDAYS AS DIRECTED 48 tablet 1   spironolactone (ALDACTONE) 25 MG tablet Take 0.5 tablets (12.5 mg total) by mouth daily. 45 tablet 3   torsemide (DEMADEX) 20 MG tablet Take 1 tablet (20 mg total) by mouth daily. (Patient taking differently: Take 20 mg by mouth 2 (two) times daily.) 90 tablet 2   zolpidem (AMBIEN) 5 MG tablet Take 1 tablet (5 mg total)  by mouth at bedtime as needed for sleep. (Patient taking differently: Take 5 mg by mouth at bedtime.) 30 tablet 2   No current facility-administered medications for this visit.    REVIEW OF SYSTEMS:  [X]  denotes positive finding, [ ]  denotes negative finding Cardiac  Comments:  Chest pain or chest pressure:    Shortness of breath upon exertion:    Short of breath when lying flat:    Irregular heart rhythm:        Vascular    Pain in calf, thigh, or hip brought on by ambulation:    Pain in feet at night that wakes you up from your sleep:     Blood clot in your veins:    Leg swelling:         Pulmonary    Oxygen at home:    Productive cough:     Wheezing:         Neurologic    Sudden weakness in arms or legs:     Sudden numbness in arms or legs:     Sudden onset of difficulty speaking or slurred speech:    Temporary loss of vision in one eye:     Problems with dizziness:         Gastrointestinal    Blood in stool:     Vomited blood:         Genitourinary    Burning when urinating:     Blood in urine:        Psychiatric    Major depression:         Hematologic    Bleeding problems:    Problems with blood clotting too easily:        Skin    Rashes or ulcers:        Constitutional    Fever or chills:      PHYSICAL EXAM: There were no vitals filed for this visit.   GENERAL: The patient is a well-nourished male, in no acute distress. The vital signs are documented above. CARDIAC: There is a regular rate and rhythm.  VASCULAR:  Palpable femoral pulses bilaterally No palpable right  pedal pulses but no tissue loss Brisk right DP/PT signals  DATA:   ABIs 02/25/23 improved from 0.64 right --> 0.77 right after intervention  Aortoiliac duplex 02/25/23 shows high grade stenosis right EIA with velocity 404 improved to 200 after recent right external iliac artery stenting    Right lower extremity arterial duplex 02/25/23 shows patent SFA stent without stenosis    Assessment/Plan:  77 year old male status post right common femoral endarterectomy with profundoplasty and bovine pericardial patch angioplasty and right common iliac stent on 08/20/2020 for short distance lifestyle limiting claudication.  He developed recurrent calf claudication and started back smoking.  Ultimately then underwent right common iliac angioplasty with DCB as well as right SFA and above-knee popliteal angioplasty with DCB on 05/30/2021.   Recent surveillance he developed high-grade recurrent stenosis in the right external iliac and right SFA with disabling claudication symptoms in the right leg.  On 02/05/2023 he underwent right SFA angioplasty with stent placement for a 90% stenosis and right external iliac artery angioplasty and stent placement for 60 to 70% stenosis.  Both of these are patent by duplex.  He has palpable common femoral artery pulse on the right with brisk DP PT signals in the foot and an improved ABI of 0.77 with no recurrent symptoms.  Discussed we will continue surveillance and I will see him in 6 months with aortoiliac duplex, right leg arterial duplex and ABIs.  I have asked that he stay on Plavix and statin from my standpoint if able.  I have also talked about smoking cessation.     Cephus Shelling, MD [facility] Office: [phone]

## 2023-03-10 ENCOUNTER — Ambulatory Visit (INDEPENDENT_AMBULATORY_CARE_PROVIDER_SITE_OTHER): Payer: Medicare Other | Admitting: Vascular Surgery

## 2023-03-10 ENCOUNTER — Encounter: Payer: Self-pay | Admitting: Vascular Surgery

## 2023-03-10 VITALS — BP 132/73 | HR 112 | Ht 66.0 in | Wt 159.0 lb

## 2023-03-10 DIAGNOSIS — I739 Peripheral vascular disease, unspecified: Secondary | ICD-10-CM | POA: Diagnosis not present

## 2023-03-11 DIAGNOSIS — I152 Hypertension secondary to endocrine disorders: Secondary | ICD-10-CM | POA: Diagnosis not present

## 2023-03-11 DIAGNOSIS — I5033 Acute on chronic diastolic (congestive) heart failure: Secondary | ICD-10-CM | POA: Diagnosis not present

## 2023-03-11 DIAGNOSIS — E1142 Type 2 diabetes mellitus with diabetic polyneuropathy: Secondary | ICD-10-CM | POA: Diagnosis not present

## 2023-03-11 DIAGNOSIS — Z48812 Encounter for surgical aftercare following surgery on the circulatory system: Secondary | ICD-10-CM | POA: Diagnosis not present

## 2023-03-11 DIAGNOSIS — E1159 Type 2 diabetes mellitus with other circulatory complications: Secondary | ICD-10-CM | POA: Diagnosis not present

## 2023-03-11 DIAGNOSIS — D62 Acute posthemorrhagic anemia: Secondary | ICD-10-CM | POA: Diagnosis not present

## 2023-03-12 ENCOUNTER — Ambulatory Visit: Payer: Medicare Other | Admitting: Adult Health

## 2023-03-12 ENCOUNTER — Ambulatory Visit (INDEPENDENT_AMBULATORY_CARE_PROVIDER_SITE_OTHER): Payer: Medicare Other

## 2023-03-12 DIAGNOSIS — R000 Tachycardia, unspecified: Secondary | ICD-10-CM

## 2023-03-12 NOTE — Progress Notes (Signed)
Cardiology Office Note:  .   Date:  03/13/2023  ID:  John [name], DOB 01-Jun-1945, MRN [id number] PCP: Junie Spencer, FNP  [facility] Providers Cardiologist:  Reatha Harps, M what he says D }   History of Present Illness: John [name] is a 77 y.o. male  with history of HFpEF, atrial flutter, status post ablation 01/13/2022, CHA2DS2-VASc score of 5, hyperlipidemia PAD, right femoral endarterectomy 08/20/2020 and right common iliac artery stent 08/10/2020, COPD, non-small cell lung CA status post chemoradiation in 2018, and GI bleed.   She was referred to EP to be considered for Watchman device. ILR was placed per Dr. Lalla Brothers. Eliquis was discontinued and he was started on aspirin 81 mg daily.  He is wheelchair dependent.  Metolazone was discontinued due to dizziness.  When last seen in the office on 12/30/2022 he was feeling better without any cardiac complaints.  The patient was seen in the ED on 02/19/2023 with complaints of difficulty breathing with exertion and generalized fatigue for 2 weeks.  He had recently undergone right lower extremity arteriogram and angioplasty with stent to the FSA and right at external iliac artery by Dr. Chestine Spore.  He did well with the procedure.  He was found to be anemic and had to receive 1 unit of packed red blood cell transfusion, has had further bleeding and underwent EGD.  He underwent capsule study as well.  He comes today concerned about his heart rate being so fast.  He states it is "wearing me out."  He has chronic shortness of breath for which she is using inhalers and has had worsening trouble with this since the weather has turned cold.  He denies any dizziness, palpitations, or further bleeding.  He was to follow-up with GI to discuss his test results.  His wife states that he is sleeping a lot during the day but he does use CPAP at night.   ROS: As above otherwise negative.    Studies Reviewed: .       Echocardiogram  09/15/2021 . Aortic valve calcified with restricted motion but no AS by doppler.   2. Left ventricular ejection fraction, by estimation, is 55 to 60%. The  left ventricle has normal function. The left ventricle has no regional  wall motion abnormalities. Left ventricular diastolic parameters are  indeterminate.   3. Right ventricular systolic function is normal. The right ventricular  size is normal. There is mildly elevated pulmonary artery systolic  pressure.   4. Left atrial size was mildly dilated.   5. The mitral valve is normal in structure. No evidence of mitral valve  regurgitation. No evidence of mitral stenosis. Moderate mitral annular  calcification.   6. The aortic valve is calcified. Aortic valve regurgitation is trivial.  Aortic valve sclerosis/calcification is present, without any evidence of  aortic stenosis.   7. The inferior vena cava is dilated in size with <50% respiratory  variability, suggesting right atrial pressure of 15 mmHg.   Physical Exam:   VS:  BP (!) 118/58   Pulse (!) 101   Ht 5\' 6"  (1.676 m)   Wt 157 lb (71.2 kg)   SpO2 96%   BMI 25.34 kg/m    Wt Readings from Last 3 Encounters:  03/13/23 157 lb (71.2 kg)  03/10/23 159 lb (72.1 kg)  03/04/23 157 lb 6 oz (71.4 kg)    GEN: Well nourished, well developed in no acute distress NECK: No JVD; No carotid  bruits CARDIAC: RRR tachycardic, 2/6 systolic heard best at the left sternal border, no rubs, gallops RESPIRATORY:  Clear to auscultation without rales, wheezing or rhonchi.  ABDOMEN: Soft, non-tender, non-distended EXTREMITIES:  No edema; No deformity   ASSESSMENT AND PLAN: .    Chronic diastolic heart failure: There is some confusion on which dose of torsemide he is taking.  Discharge summary has him on 20 mg daily, he believes that he is on 40 mg daily, and therefore this will need to be clarified.  He denies any lower extremity edema PND or orthopnea.  He remains on spironolactone 12.5 mg daily.   He will decrease his torsemide to 10 mg daily if he is taking 20 mg of this daily, if he is on 20 mg twice daily he will reduce it to 20 mg daily.  His wife is to call us back and let us know what he is taking.  2.  Tachycardia: Patient is uncomfortable with elevated heart rate.  I will start him on low-dose bisoprolol 5 mg daily as this is better tolerated and dose with COPD.  I will see him back in a couple of weeks to see how well he is responding to medications.  3.  Hypertension: Blood pressure is low normal.  Reduction of his torsemide may be helpful in this endeavor.  I would like to follow-up with the BM[facility] on next office visit.  4.  PAD: Remains on Plavix.right femoral endarterectomy 08/20/2020 and right common iliac artery stent 08/10/2020,   5.  COPD: Continues to follow with primary care and using inhalers.  More tenuous during really cold weather.  Using inhalers more often.         Signed, Bettey Mare. Liborio Nixon, ANP, AACC

## 2023-03-13 ENCOUNTER — Ambulatory Visit: Payer: Medicare Other | Attending: Adult Health | Admitting: Adult Health

## 2023-03-13 ENCOUNTER — Encounter: Payer: Self-pay | Admitting: Adult Health

## 2023-03-13 VITALS — BP 118/58 | HR 101 | Ht 66.0 in | Wt 157.0 lb

## 2023-03-13 DIAGNOSIS — I4719 Other supraventricular tachycardia: Secondary | ICD-10-CM | POA: Diagnosis not present

## 2023-03-13 DIAGNOSIS — I5032 Chronic diastolic (congestive) heart failure: Secondary | ICD-10-CM | POA: Insufficient documentation

## 2023-03-13 DIAGNOSIS — I739 Peripheral vascular disease, unspecified: Secondary | ICD-10-CM | POA: Insufficient documentation

## 2023-03-13 DIAGNOSIS — I10 Essential (primary) hypertension: Secondary | ICD-10-CM | POA: Diagnosis not present

## 2023-03-13 DIAGNOSIS — J449 Chronic obstructive pulmonary disease, unspecified: Secondary | ICD-10-CM | POA: Diagnosis not present

## 2023-03-13 MED ORDER — BISOPROLOL FUMARATE 5 MG PO TABS: 5.0000 mg | ORAL_TABLET | Freq: Every day | ORAL | 1 refills | Status: DC

## 2023-03-13 NOTE — Patient Instructions (Signed)
Medication Instructions:  Decrease Torsemide 20 mg ( Take 1 Tablet Daily). Start Bisoprolol 5 mg ( Take 1 Tablet Daily). *If you need a refill on your cardiac medications before your next appointment, please call your pharmacy*   Lab Work: No Labs If you have labs (blood work) drawn today and your tests are completely normal, you will receive your results only by: MyChart Message (if you have MyChart) OR A paper copy in the mail If you have any lab test that is abnormal or we need to change your treatment, we will call you to review the results.   Testing/Procedures: No Testing   Follow-Up: At [facility], you and your health needs are our priority.  As part of our continuing mission to provide you with exceptional heart care, we have created designated Provider Care Teams.  These Care Teams include your primary Cardiologist (physician) and Advanced Practice Providers (APPs -  Physician Assistants and Nurse Practitioners) who all work together to provide you with the care you need, when you need it.  We recommend signing up for the patient portal called "MyChart".  Sign up information is provided on this After Visit Summary.  MyChart is used to connect with patients for Virtual Visits (Telemedicine).  Patients are able to view lab/test results, encounter notes, upcoming appointments, etc.  Non-urgent messages can be sent to your provider as well.   To learn more about what you can do with MyChart, go to [email or web address].    Your next appointment:   2 week(s)  Provider:   Joni Reining, DNP, ANP

## 2023-03-16 ENCOUNTER — Other Ambulatory Visit: Payer: Self-pay

## 2023-03-16 DIAGNOSIS — Z95828 Presence of other vascular implants and grafts: Secondary | ICD-10-CM

## 2023-03-16 DIAGNOSIS — I739 Peripheral vascular disease, unspecified: Secondary | ICD-10-CM

## 2023-03-16 LAB — CUP PACEART REMOTE DEVICE CHECK
Date Time Interrogation Session: 20241209002300
Pulse Gen Serial Number: 104324

## 2023-03-18 ENCOUNTER — Telehealth: Payer: Self-pay | Admitting: Family Medicine

## 2023-03-18 NOTE — Telephone Encounter (Signed)
Feels weak, tired,cold.  Started med last Thursday for increased heart rate  No fever, vomiting, diarrhea, constipation  Pulse 100. Has not taken med today  O2 94 currently  Will get recommendations from [place]

## 2023-03-18 NOTE — Telephone Encounter (Signed)
Pt aware of provider feedback and voiced understanding. 

## 2023-03-18 NOTE — Telephone Encounter (Signed)
Copied from CRM [id number]. Topic: Clinical - Medication Question >> Mar 18, 2023 10:23 AM Fuller Mandril wrote: Reason for CRM: PT Caregiver called. States pt has not been feeling well after starting bisoprolol (ZEBETA) 5 MG tablet prescribed by heart doctor for pulse rate. He is having trouble keeping warm and is just not feeling well. Wanted to discuss with Jannifer Rodney to see what she suggests for pt.

## 2023-03-18 NOTE — Telephone Encounter (Signed)
Covering for Christy, metoprolol can make you feel little weak and tired at first but your body does tend to get used to it if you can take it for the next couple weeks and that should go away and then just watch for any signs of may be a viral infection or other respiratory thing that might be making him feel weak or tired as well.  If he can continue with the medicine his body will get used to it and no longer feel that way

## 2023-03-19 DIAGNOSIS — I152 Hypertension secondary to endocrine disorders: Secondary | ICD-10-CM | POA: Diagnosis not present

## 2023-03-19 DIAGNOSIS — E1142 Type 2 diabetes mellitus with diabetic polyneuropathy: Secondary | ICD-10-CM | POA: Diagnosis not present

## 2023-03-19 DIAGNOSIS — D62 Acute posthemorrhagic anemia: Secondary | ICD-10-CM | POA: Diagnosis not present

## 2023-03-19 DIAGNOSIS — Z48812 Encounter for surgical aftercare following surgery on the circulatory system: Secondary | ICD-10-CM | POA: Diagnosis not present

## 2023-03-19 DIAGNOSIS — I5033 Acute on chronic diastolic (congestive) heart failure: Secondary | ICD-10-CM | POA: Diagnosis not present

## 2023-03-19 DIAGNOSIS — E1159 Type 2 diabetes mellitus with other circulatory complications: Secondary | ICD-10-CM | POA: Diagnosis not present

## 2023-03-23 ENCOUNTER — Other Ambulatory Visit: Payer: Self-pay

## 2023-03-23 DIAGNOSIS — C3491 Malignant neoplasm of unspecified part of right bronchus or lung: Secondary | ICD-10-CM

## 2023-03-24 ENCOUNTER — Ambulatory Visit: Payer: Self-pay | Admitting: Family

## 2023-03-24 ENCOUNTER — Ambulatory Visit (HOSPITAL_COMMUNITY)
Admission: RE | Admit: 2023-03-24 | Discharge: 2023-03-24 | Disposition: A | Discharge status: Home / Self Care | Payer: Medicare Other | Source: Ambulatory Visit | Attending: Hematology | Admitting: Hematology

## 2023-03-24 ENCOUNTER — Inpatient Hospital Stay: Payer: Medicare Other | Attending: Hematology

## 2023-03-24 ENCOUNTER — Other Ambulatory Visit: Payer: Self-pay | Admitting: Family

## 2023-03-24 DIAGNOSIS — E039 Hypothyroidism, unspecified: Secondary | ICD-10-CM | POA: Insufficient documentation

## 2023-03-24 DIAGNOSIS — Z85118 Personal history of other malignant neoplasm of bronchus and lung: Secondary | ICD-10-CM | POA: Diagnosis not present

## 2023-03-24 DIAGNOSIS — C3490 Malignant neoplasm of unspecified part of unspecified bronchus or lung: Secondary | ICD-10-CM | POA: Diagnosis not present

## 2023-03-24 DIAGNOSIS — Z08 Encounter for follow-up examination after completed treatment for malignant neoplasm: Secondary | ICD-10-CM | POA: Insufficient documentation

## 2023-03-24 DIAGNOSIS — J90 Pleural effusion, not elsewhere classified: Secondary | ICD-10-CM | POA: Diagnosis not present

## 2023-03-24 DIAGNOSIS — I700 Atherosclerosis of aorta: Secondary | ICD-10-CM | POA: Diagnosis not present

## 2023-03-24 DIAGNOSIS — I2510 Atherosclerotic heart disease of native coronary artery without angina pectoris: Secondary | ICD-10-CM | POA: Insufficient documentation

## 2023-03-24 DIAGNOSIS — C3491 Malignant neoplasm of unspecified part of right bronchus or lung: Secondary | ICD-10-CM | POA: Insufficient documentation

## 2023-03-24 DIAGNOSIS — J4489 Other specified chronic obstructive pulmonary disease: Secondary | ICD-10-CM | POA: Insufficient documentation

## 2023-03-24 DIAGNOSIS — D539 Nutritional anemia, unspecified: Secondary | ICD-10-CM | POA: Diagnosis not present

## 2023-03-24 DIAGNOSIS — J432 Centrilobular emphysema: Secondary | ICD-10-CM | POA: Diagnosis not present

## 2023-03-24 DIAGNOSIS — F5101 Primary insomnia: Secondary | ICD-10-CM

## 2023-03-24 LAB — COMPREHENSIVE METABOLIC PANEL
ALT: 11 U/L (ref 0–44)
AST: 13 U/L — ABNORMAL LOW (ref 15–41)
Albumin: 3.3 g/dL — ABNORMAL LOW (ref 3.5–5.0)
Alkaline Phosphatase: 91 U/L (ref 38–126)
Anion gap: 8 (ref 5–15)
BUN: 16 mg/dL (ref 8–23)
CO2: 27 mmol/L (ref 22–32)
Calcium: 9.1 mg/dL (ref 8.9–10.3)
Chloride: 100 mmol/L (ref 98–111)
Creatinine, Ser: 0.84 mg/dL (ref 0.61–1.24)
GFR, Estimated: >60 mL/min (ref >60)
Glucose, Bld: 113 mg/dL — ABNORMAL HIGH (ref 70–99)
Potassium: 3.9 mmol/L (ref 3.5–5.1)
Sodium: 135 mmol/L (ref 135–145)
Total Bilirubin: 0.6 mg/dL (ref <1.2)
Total Protein: 7.1 g/dL (ref 6.5–8.1)

## 2023-03-24 LAB — CBC WITH DIFFERENTIAL/PLATELET
Abs Immature Granulocytes: 0.02 10*3/uL (ref 0.00–0.07)
Basophils Absolute: 0.1 10*3/uL (ref 0.0–0.1)
Basophils Relative: 1 %
Eosinophils Absolute: 0.1 10*3/uL (ref 0.0–0.5)
Eosinophils Relative: 1 %
HCT: 37.9 % — ABNORMAL LOW (ref 39.0–52.0)
Hemoglobin: 10.7 g/dL — ABNORMAL LOW (ref 13.0–17.0)
Immature Granulocytes: 0 %
Lymphocytes Relative: 6 %
Lymphs Abs: 0.4 10*3/uL — ABNORMAL LOW (ref 0.7–4.0)
MCH: 28.1 pg (ref 26.0–34.0)
MCHC: 28.2 g/dL — ABNORMAL LOW (ref 30.0–36.0)
MCV: 99.5 fL (ref 80.0–100.0)
Monocytes Absolute: 0.6 10*3/uL (ref 0.1–1.0)
Monocytes Relative: 8 %
Neutro Abs: 6.2 10*3/uL (ref 1.7–7.7)
Neutrophils Relative %: 84 %
Platelets: 289 10*3/uL (ref 150–400)
RBC: 3.81 MIL/uL — ABNORMAL LOW (ref 4.22–5.81)
WBC: 7.3 10*3/uL (ref 4.0–10.5)
nRBC: 0 % (ref 0.0–0.2)

## 2023-03-24 LAB — IRON AND TIBC
Iron: 207 ug/dL — ABNORMAL HIGH (ref 45–182)
Saturation Ratios: 49 % — ABNORMAL HIGH (ref 17.9–39.5)
TIBC: 425 ug/dL (ref 250–450)
UIBC: 218 ug/dL

## 2023-03-24 LAB — FERRITIN: Ferritin: 29 ng/mL (ref 24–336)

## 2023-03-24 MED ORDER — IOHEXOL 300 MG/ML  SOLN
75.0000 mL | Freq: Once | INTRAMUSCULAR | Status: AC | PRN
  Administered 2023-03-24: 75 mL via INTRAVENOUS

## 2023-03-24 NOTE — Telephone Encounter (Signed)
Chief Complaint: requesting hemoglobin check  Symptoms: fatigue/weakness, feeling cold, pale  Frequency: x 1-2 weeks  Pertinent Negatives: Patient denies bloody, black or tarry stools, vomiting blood, abdominal pain, chest pain, diarrhea, fever, syncope  Disposition: [] ED /[] Urgent Care (no appt availability in office) / [x] Appointment(In office/virtual)/ []  [facility]/ [] Home Care/ [] Refused Recommended Disposition /[] [facility]/ []  Follow-up with PCP Additional Notes: Patient c/o hospitalized 1 month ago for GI bleed and anemia, received blood transfusion while inpatient. Pt states about a week ago his cardiologist started him on bisoprolol and he has noticed weakness/fatigue for 1-2 weeks. Pt denies blood in vomit or stool. Pt states he feels cold and pale but states he usually feels that way due to his blood thinner. Pt states he has been taking his iron supplement. Pt states 1 week ago he stubbed his toe and fell at home but denies LOC or hitting his head. Pt agreeable to OV; called CAL line and spoke with Marian Sorrow, confirmed pt will need to be seen by provider for any lab orders. Pt aware and verbalizes understanding of concerning symptoms and when to go to ED or call 911.  Copied from CRM [id number]. Topic: Clinical - Red Word Triage >> Mar 24, 2023 11:11 AM Larwance Sachs wrote: Red Word that prompted transfer to Nurse Triage: Patient called in to get blood checked regarding having blood in stools, stated he had some vessels repaired in his stomach over a month ago at [facility] and has experience some bleeding since then. Reason for Disposition  [1] MODERATE weakness (i.e., interferes with work, school, normal activities) AND [2] persists > 3 days  Answer Assessment - Initial Assessment Questions 1. DESCRIPTION: "Describe how you are feeling."     "I've just been feeling pretty bleh", weak and fatigued worse than usual. Cold and pale, pt states he is also on blood thinners  and he feels cold from that. "I stay sleepy all the time, except at night"  2. SEVERITY: "How bad is it?"  "Can you stand and walk?"   - MILD (0-3): Feels weak or tired, but does not interfere with work, school or normal activities.   - MODERATE (4-7): Able to stand and walk; weakness interferes with work, school, or normal activities.   - SEVERE (8-10): Unable to stand or walk; unable to do usual activities.     Pt states he has been able to get up, walk around, take a shower but does feel tired during the day.  3. ONSET: "When did these symptoms begin?" (e.g., hours, days, weeks, months)     X 1-2 weeks. 4. CAUSE: "What do you think is causing the weakness or fatigue?" (e.g., not drinking enough fluids, medical problem, trouble sleeping)     Pt states he was hospitalized a month ago for anemia and GI bleed. Pt thinks it could be related and would like his Hgb drawn.  5. NEW MEDICINES:  "Have you started on any new medicines recently?" (e.g., opioid pain medicines, benzodiazepines, muscle relaxants, antidepressants, antihistamines, neuroleptics, beta blockers)     Pt states his cardiologist started him on bisoprolol and states he feels that has been making him feel weaker.  6. OTHER SYMPTOMS: "Do you have any other symptoms?" (e.g., chest pain, fever, cough, SOB, vomiting, diarrhea, bleeding, other areas of pain)     Pt states he has SOB but states it is from his COPD. Pt c/o nausea.  Protocols used: Weakness (Generalized) and Fatigue-A-AH

## 2023-03-25 ENCOUNTER — Ambulatory Visit: Payer: Medicare Other | Admitting: Family Medicine

## 2023-03-25 ENCOUNTER — Encounter: Payer: Self-pay | Admitting: Family Medicine

## 2023-03-25 ENCOUNTER — Telehealth: Payer: Medicare Other | Admitting: Family Medicine

## 2023-03-25 DIAGNOSIS — J449 Chronic obstructive pulmonary disease, unspecified: Secondary | ICD-10-CM

## 2023-03-25 DIAGNOSIS — D649 Anemia, unspecified: Secondary | ICD-10-CM

## 2023-03-25 MED ORDER — PREDNISONE 20 MG PO TABS: ORAL_TABLET | ORAL | 0 refills | Status: DC

## 2023-03-25 NOTE — Progress Notes (Signed)
Virtual Visit via MyChart video note  I connected with John [name] on 03/25/23 at 1354 by video and verified that I am speaking with the correct person using two identifiers. John [name] is currently located at home and  wife  are currently with her during visit. The provider, Elige Radon Chandrea Zellman, MD is located in their office at time of visit.  Call ended at 1406  I discussed the limitations, risks, security and privacy concerns of performing an evaluation and management service by video and the availability of in person appointments. I also discussed with the patient that there may be a patient responsible charge related to this service. The patient expressed understanding and agreed to proceed.   History and Present Illness: Patient is calling in for low oxygen.  He had 1 month ago GI bleed. His oxygen today is 91-92 and HR 72 today. Today he feels better.  Yesterday he was feeling down and no energy.  His wife says he is still pale.  He is using oxygen 2Lnc and doesn't normally use it regularly.  His hb was better yesterday 10.7 and was 8.8 before. He was started on propranolol and feeling more tired. His breathing is better. He is having some wheezing and is using rescue inhaler.   1. Chronic obstructive pulmonary disease, unspecified COPD type (HCC)   2. Symptomatic anemia     Outpatient Encounter Medications as of 03/25/2023  Medication Sig   predniSONE (DELTASONE) 20 MG tablet 2 po at same time daily for 5 days   albuterol (VENTOLIN HFA) 108 (90 Base) MCG/ACT inhaler INHALE 2 PUFFS INTO THE LUNGS EVERY 6 HOURS AS NEEDED FOR WHEEZE OR SHORTNESS OF BREATH   allopurinol (ZYLOPRIM) 300 MG tablet TAKE 1 TABLET BY MOUTH EVERY DAY   aspirin EC 81 MG tablet Take 1 tablet (81 mg total) by mouth daily. Swallow whole. (Patient not taking: Reported on 03/13/2023)   bisoprolol (ZEBETA) 5 MG tablet Take 1 tablet (5 mg total) by mouth daily.   Blood Glucose Monitoring Suppl (ONETOUCH  VERIO REFLECT) w/Device KIT Use to test blood sugars daily as directed. DX: E11.9   clopidogrel (PLAVIX) 75 MG tablet Take 1 tablet (75 mg total) by mouth daily.   Continuous Glucose Receiver (FREESTYLE LIBRE 3 READER) DEVI 1 Device by Does not apply route every 14 (fourteen) days.   Continuous Glucose Sensor (FREESTYLE LIBRE 3 SENSOR) MISC 1 Device by Does not apply route continuous.   docusate sodium (COLACE) 100 MG capsule Take 1 capsule (100 mg total) by mouth 2 (two) times daily.   dutasteride (AVODART) 0.5 MG capsule Take 1 capsule (0.5 mg total) by mouth daily.   famotidine (PEPCID) 20 MG tablet Take 1 tablet (20 mg total) by mouth daily.   ferrous sulfate 325 (65 FE) MG EC tablet Take 1 tablet (325 mg total) by mouth 2 (two) times daily.   Fluticasone-Umeclidin-Vilant (TRELEGY ELLIPTA) 100-62.5-25 MCG/ACT AEPB Inhale 1 puff into the lungs daily.   gabapentin (NEURONTIN) 300 MG capsule TAKE 1 CAPSULE BY MOUTH THREE TIMES A DAY   insulin degludec (TRESIBA FLEXTOUCH) 200 UNIT/ML FlexTouch Pen Inject 16 Units into the skin every evening.   Insulin Pen Needle (BD PEN NEEDLE NANO U/F) 32G X 4 MM MISC Use to inject insulin once daily   levothyroxine (SYNTHROID) 50 MCG tablet TAKE 1 TABLET BY MOUTH EVERY DAY   linaclotide (LINZESS) 72 MCG capsule Take 1 capsule (72 mcg total) by mouth daily before breakfast.  NON FORMULARY CPAP at bedtime   ondansetron (ZOFRAN) 4 MG tablet Take 1 tablet (4 mg total) by mouth every 8 (eight) hours as needed for nausea or vomiting.   OneTouch Delica Lancets 30G MISC Use to test blood sugars daily as directed. DX: E11.9   OXYGEN Inhale 2 L into the lungs daily as needed (with Cpap at night).   pantoprazole (PROTONIX) 40 MG tablet Take 1 tablet (40 mg total) by mouth daily.   pravastatin (PRAVACHOL) 40 MG tablet TAKE 1 TABLET (40 MG TOTAL) BY MOUTH ON MONDAYS , WEDNESDAYS, AND FRIDAYS AS DIRECTED   spironolactone (ALDACTONE) 25 MG tablet Take 0.5 tablets (12.5 mg  total) by mouth daily.   torsemide (DEMADEX) 20 MG tablet Take 1 tablet (20 mg total) by mouth daily. (Patient taking differently: Take 20 mg by mouth once.)   torsemide (DEMADEX) 20 MG tablet Take 20 mg by mouth daily. Take 1 Tablet Daily   zolpidem (AMBIEN) 5 MG tablet Take 1 tablet (5 mg total) by mouth at bedtime as needed for sleep. (Patient taking differently: Take 5 mg by mouth at bedtime.)   No facility-administered encounter medications on file as of 03/25/2023.    Review of Systems  Constitutional:  Negative for chills and fever.  HENT:  Positive for congestion and sore throat. Negative for ear discharge, ear pain, postnasal drip, rhinorrhea, sinus pressure, sneezing and voice change.   Eyes:  Negative for pain, discharge, redness and visual disturbance.  Respiratory:  Positive for cough. Negative for shortness of breath and wheezing.   Cardiovascular:  Negative for chest pain and leg swelling.  Musculoskeletal:  Negative for back pain and gait problem.  Skin:  Negative for rash.  All other systems reviewed and are negative.   Observations/Objective: Patient sounds comfortable and in no acute distress  Assessment and Plan: Problem List Items Addressed This Visit       Respiratory   COPD (chronic obstructive pulmonary disease) (HCC) - Primary   Relevant Medications   predniSONE (DELTASONE) 20 MG tablet     Other   Symptomatic anemia     Follow up plan: Return if symptoms worsen or fail to improve.     I discussed the assessment and treatment plan with the patient. The patient was provided an opportunity to ask questions and all were answered. The patient agreed with the plan and demonstrated an understanding of the instructions.   The patient was advised to call back or seek an in-person evaluation if the symptoms worsen or if the condition fails to improve as anticipated.  The above assessment and management plan was discussed with the patient. The patient  verbalized understanding of and has agreed to the management plan. Patient is aware to call the clinic if symptoms persist or worsen. Patient is aware when to return to the clinic for a follow-up visit. Patient educated on when it is appropriate to go to the emergency department.    I provided 12 minutes of non-face-to-face time during this encounter.    Nils Pyle, MD

## 2023-03-27 ENCOUNTER — Other Ambulatory Visit: Payer: Self-pay | Admitting: Family

## 2023-03-27 DIAGNOSIS — E1159 Type 2 diabetes mellitus with other circulatory complications: Secondary | ICD-10-CM | POA: Diagnosis not present

## 2023-03-27 DIAGNOSIS — E1142 Type 2 diabetes mellitus with diabetic polyneuropathy: Secondary | ICD-10-CM | POA: Diagnosis not present

## 2023-03-27 DIAGNOSIS — I152 Hypertension secondary to endocrine disorders: Secondary | ICD-10-CM | POA: Diagnosis not present

## 2023-03-27 DIAGNOSIS — Z48812 Encounter for surgical aftercare following surgery on the circulatory system: Secondary | ICD-10-CM | POA: Diagnosis not present

## 2023-03-27 DIAGNOSIS — F5101 Primary insomnia: Secondary | ICD-10-CM

## 2023-03-27 DIAGNOSIS — D62 Acute posthemorrhagic anemia: Secondary | ICD-10-CM | POA: Diagnosis not present

## 2023-03-27 DIAGNOSIS — I5033 Acute on chronic diastolic (congestive) heart failure: Secondary | ICD-10-CM | POA: Diagnosis not present

## 2023-03-30 ENCOUNTER — Other Ambulatory Visit: Payer: Self-pay | Admitting: Family

## 2023-03-30 DIAGNOSIS — E1159 Type 2 diabetes mellitus with other circulatory complications: Secondary | ICD-10-CM | POA: Diagnosis not present

## 2023-03-30 DIAGNOSIS — Z48812 Encounter for surgical aftercare following surgery on the circulatory system: Secondary | ICD-10-CM | POA: Diagnosis not present

## 2023-03-30 DIAGNOSIS — F5101 Primary insomnia: Secondary | ICD-10-CM

## 2023-03-30 DIAGNOSIS — D62 Acute posthemorrhagic anemia: Secondary | ICD-10-CM | POA: Diagnosis not present

## 2023-03-30 DIAGNOSIS — E1142 Type 2 diabetes mellitus with diabetic polyneuropathy: Secondary | ICD-10-CM | POA: Diagnosis not present

## 2023-03-30 DIAGNOSIS — I5033 Acute on chronic diastolic (congestive) heart failure: Secondary | ICD-10-CM | POA: Diagnosis not present

## 2023-03-30 DIAGNOSIS — I152 Hypertension secondary to endocrine disorders: Secondary | ICD-10-CM | POA: Diagnosis not present

## 2023-03-30 NOTE — Telephone Encounter (Signed)
Copied from CRM [id number]. Topic: Clinical - Medication Refill >> Mar 30, 2023  2:53 PM Carlatta H wrote: Most Recent Primary Care Visit:  Provider: [facility]  Department: [facility]  Visit Type: MYCHART VIDEO VISIT  Date: 03/25/2023  Medication: zolpidem (AMBIEN) 5 MG tablet [[id number]]  Has the patient contacted their pharmacy? Yes (Agent: If no, request that the patient contact the pharmacy for the refill. If patient does not wish to contact the pharmacy document the reason why and proceed with request.) (Agent: If yes, when and what did the pharmacy advise?)  Is this the correct pharmacy for this prescription? Yes If no, delete pharmacy and type the correct one.  This is the patient's preferred pharmacy:  [id number] - [place], [place] - [address] [place] [address] Phone: [phone] Fax: [phone]   Has the prescription been filled recently? No  Is the patient out of the medication? Yes  Has the patient been seen for an appointment in the last year OR does the patient have an upcoming appointment? Yes  Can we respond through MyChart? No  Agent: Please be advised that Rx refills may take up to 3 business days. We ask that you follow-up with your pharmacy.

## 2023-03-30 NOTE — Progress Notes (Signed)
Cardiology Office Note:  .   Date:  04/03/2023  ID:  John [name], DOB [birth year], MRN [id number] PCP: Junie Spencer, FNP   [facility] Providers Cardiologist:  Reatha Harps, MD   }   History of Present Illness: John [name] is a 77 y.o. male e  with history of HFpEF, atrial flutter, status post ablation 01/13/2022, CHA2DS2-VASc score of 5, hyperlipidemia PAD, right femoral endarterectomy 08/20/2020 and right common iliac artery stent 08/10/2020, COPD, non-small cell lung CA status post chemoradiation in 2018, and GI bleed.     Referred to EP to be considered for Watchman device. ILR was placed per Dr. Lalla Brothers. Eliquis was discontinued and he was started on aspirin 81 mg daily.  He is wheelchair dependent.  Metolazone was discontinued due to dizziness.   When last seen in the office on 03/15/2023 the patient was concerned about resting heart rate being so fast.  He stated that it was wearing him out.  He also has chronic shortness of breath and has been using inhalers more often.  He apparently is sleeping a lot during the day but is also wearing his CPAP at night.  On that visit I decreased his torsemide to 10 mg daily due to lack of lower extremity edema and low blood pressure.  There was some concern about what dose he was on.  He he apparently had listed torsemide 20 mg twice a day, and if this was the case he was to go down to 20 mg daily.  I started him on low-dose bisoprolol 5 mg to help with his heart rate control as this was better tolerated with COPD.  He was to continue medical management remaining on Plavix status post right femoral endarterectomy in May 2022 and right common iliac artery in May 2022.  He could not tolerate the bisoprolol as he stated it causes confusion and delirium.  His wife said he was "talking out of his head".  Once he stopped the medication all of this cleared.  He does have an ILR and it did reveal rapid heart rhythm, and he has been  scheduled to be seen by EP on April 09, 2023.  He continues to be symptomatic with rapid heart rate stating he is feeling tired and short of breath associated with this.  He admits to using his inhalers a lot during the day, sometimes every 4 hours due to breathing issues.  He has also noticed that he is having worsening lower extremity edema with decreased dose of torsemide.  He has been eating some salted foods over the holidays.  ROS: As above otherwise negative  Studies Reviewed: Marland Kitchen   EKG Interpretation Date/Time:  Friday April 03 2023 15:04:55 EST Ventricular Rate:  116 PR Interval:  248 QRS Duration:  110 QT Interval:  308 QTC Calculation: 428 R Axis:   10  Text Interpretation: Sinus tachycardia with 1st degree A-V block Low voltage QRS Right bundle branch block Inferior infarct (cited on or before 13-Jan-2022) When compared with ECG of 13-Mar-2023 15:09, Previous ECG has undetermined rhythm, needs review T wave inversion now evident in Anterior leads Confirmed by Joni Reining [phone]) on 04/03/2023 3:08:09 PM      Physical Exam:   VS:  BP 116/60 (BP Location: Left Arm, Patient Position: Sitting, Cuff Size: Normal)   Pulse (!) 110   Ht 5\' 8"  (1.727 m)   Wt 159 lb 12.8 oz (72.5 kg)   SpO2 95%  BMI 24.30 kg/m    Wt Readings from Last 3 Encounters:  04/03/23 159 lb 12.8 oz (72.5 kg)  04/02/23 160 lb (72.6 kg)  03/31/23 159 lb 3.2 oz (72.2 kg)    GEN: Well nourished, well developed in no acute distress NECK: No JVD; No carotid bruits CARDIAC: RRR, tachycardic no murmurs, rubs, gallops RESPIRATORY:  Clear to auscultation without rales, wheezing or rhonchi  ABDOMEN: Soft, non-tender, non-distended EXTREMITIES: 2+ dependent edema; No deformity   ASSESSMENT AND PLAN: .   Tachycardia: He is intolerant of a number of beta-blockers.  I considered adding calcium channel blocker but his blood pressure may not tolerate.  I have added bisoprolol to his allergies in the chart.  He  is also not able to take Lopressor.  I have reviewed his allergies.  The patient has an ILR and it was noted on the transmission that he remained tachycardic.  He is due to see Otilio Saber, PA, in the A-fib clinic on January 2.  The patient is still concerned about his rapid heart rate.  I will start him on low-dose propranolol 10 mg 3 times daily as needed.  As this is short acting if he does not tolerated it will be out of his system sooner.  I will also start him on a lower dose than normal because he is so sensitive to medications.  Due to hypotension I did not start him on a calcium channel blocker.  When he follows up with EP they may have better options for him in the setting of COPD and multiple medication intolerances.         I have reviewed his most recent labs on 03/24/2023 and he does have anemia but this is improved from 8.8 on 02/23/2019 24-10.7 on 03/24/2023.  I was concerned that the anemia was causing tachycardia and hypotension.  He continues to be followed by oncology.   2.  History of atrial flutter: He did not have any evidence of atrial febrile flutter on ILR transmission.  It did show that he had a gradual increase in average night heart rate with trends up to approximately 120 bpm.  Starting propranolol as above.  3.  COPD: Using his inhalers very often up to every 4 hours.  He is not having a lot of wheezing right now.  But he is easily short of breath.  This may be related to the rapid heart rate along with his lung disease.  Continue with pulmonology.  4.  Hypertension: Blood pressure is low normal today.  Reluctant to start him on CCB as he is intolerant of the beta-blockers.  5.  Chronic diastolic CHF: The patient has chronic lower extremity edema which is worsened with reduction of torsemide on last office visit.  He will go back up to torsemide 20 mg daily as directed.  He is to avoid salted foods.  He will continue on spironolactone 12.5 mg daily.   6.  PAD: Continues  on Plavix after right femoral endarterectomy on May 16th 2022 and right common iliac artery stent on Aug 10, 2020.         Signed, Bettey Mare. Liborio Nixon, ANP, AACC

## 2023-03-31 ENCOUNTER — Inpatient Hospital Stay: Payer: Medicare Other

## 2023-03-31 ENCOUNTER — Inpatient Hospital Stay (HOSPITAL_BASED_OUTPATIENT_CLINIC_OR_DEPARTMENT_OTHER): Payer: Medicare Other | Admitting: Hematology

## 2023-03-31 VITALS — BP 142/66 | HR 115 | Temp 97.7°F | Resp 18 | Wt 159.2 lb

## 2023-03-31 DIAGNOSIS — E039 Hypothyroidism, unspecified: Secondary | ICD-10-CM | POA: Diagnosis not present

## 2023-03-31 DIAGNOSIS — D509 Iron deficiency anemia, unspecified: Secondary | ICD-10-CM | POA: Diagnosis not present

## 2023-03-31 DIAGNOSIS — Z08 Encounter for follow-up examination after completed treatment for malignant neoplasm: Secondary | ICD-10-CM | POA: Diagnosis not present

## 2023-03-31 DIAGNOSIS — C3491 Malignant neoplasm of unspecified part of right bronchus or lung: Secondary | ICD-10-CM

## 2023-03-31 DIAGNOSIS — D539 Nutritional anemia, unspecified: Secondary | ICD-10-CM | POA: Diagnosis not present

## 2023-03-31 DIAGNOSIS — I2510 Atherosclerotic heart disease of native coronary artery without angina pectoris: Secondary | ICD-10-CM | POA: Diagnosis not present

## 2023-03-31 DIAGNOSIS — J4489 Other specified chronic obstructive pulmonary disease: Secondary | ICD-10-CM | POA: Diagnosis not present

## 2023-03-31 DIAGNOSIS — Z85118 Personal history of other malignant neoplasm of bronchus and lung: Secondary | ICD-10-CM | POA: Diagnosis not present

## 2023-03-31 NOTE — Patient Instructions (Addendum)
[facility] at [facility] Discharge Instructions   You were seen and examined today by Dr. Ellin Saba.  He reviewed the results of your lab work which is normal/stable.   He reviewed the results of your CT scan. There are two spots that have slightly grown in size. These could be evolving radiation changes. There is nothing new seen on this exam.  Dr. Kirtland Bouchard recommends close follow up of these areas in 3 months. We will repeat a CT scan to evaluate these areas again.   We will see you back after the scan to review the results.    Thank you for choosing [facility] at [facility] to provide your oncology and hematology care.  To afford each patient quality time with our provider, please arrive at least 15 minutes before your scheduled appointment time.   If you have a lab appointment with the Cancer Center please come in thru the Main Entrance and check in at the main information desk.  You need to re-schedule your appointment should you arrive 10 or more minutes late.  We strive to give you quality time with our providers, and arriving late affects you and other patients whose appointments are after yours.  Also, if you no show three or more times for appointments you may be dismissed from the clinic at the providers discretion.     Again, thank you for choosing [facility].  Our hope is that these requests will decrease the amount of time that you wait before being seen by our physicians.       _____________________________________________________________  Should you have questions after your visit to [facility], please contact our office at [phone] and follow the prompts.  Our office hours are 8:00 a.m. and 4:30 p.m. Monday - Friday.  Please note that voicemails left after 4:00 p.m. may not be returned until the following business day.  We are closed weekends and major holidays.  You do have access to a nurse 24-7,  just call the main number to the clinic [phone] and do not press any options, hold on the line and a nurse will answer the phone.    For prescription refill requests, have your pharmacy contact our office and allow 72 hours.    Due to Covid, you will need to wear a mask upon entering the hospital. If you do not have a mask, a mask will be given to you at the Main Entrance upon arrival. For doctor visits, patients may have 1 support person age 70 or older with them. For treatment visits, patients can not have anyone with them due to social distancing guidelines and our immunocompromised population.

## 2023-03-31 NOTE — Progress Notes (Signed)
[facility] [address] [place] [address]    Clinic Day:  03/31/23   Referring physician: Junie Spencer, FNP  Patient Care Team: Junie Spencer, FNP as PCP - General (Family Medicine) O'Neal, Ronnald Ramp, MD as PCP - Cardiology (Cardiology) Lanier Prude, MD as PCP - Electrophysiology (Cardiology) Doreatha Massed, MD as Consulting Physician (Hematology) Danella Maiers, [facility] (Pharmacist) O'Neal, Ronnald Ramp, MD as Consulting Physician (Cardiology) McKenzie, Mardene Celeste, MD as Consulting Physician (Urology) Delora Fuel, OD (Optometry) Dani Gobble, NP as Nurse Practitioner (Endocrinology) Adam Phenix, DPM as Consulting Physician (Podiatry) Cephus Shelling, MD as Consulting Physician (Vascular Surgery)   ASSESSMENT & PLAN:   Assessment: 1.  Stage IIIa (T1b N2 cM0) NSCLC, favoring adenocarcinoma: -Chemo XRT with weekly carboplatin and paclitaxel completed on 08/20/2016. -Consolidation durvalumab from 09/26/2016 through 09/25/2017. -Reviewed CT chest on 11/07/2019 which showed increasing size of the upper lobe pulmonary nodules with no new nodule in the left upper lobe suspicious for meta stasis.  Also increasing confluent soft tissue in the right suprahilar region, thought to be postradiation changes. -PET scan on 11/21/2019 showed suspected radiation changes in the right paramediastinal/perihilar region although mild hypermetabolism is present in the right retroareolar region.SUV 2.5.  While progressive soft tissue is present in this region, this hypermetabolism remains nonfocal and favors radiation changes.  7 mm central left upper lobe nodule max SUV 1.4.  9 mm aggregate nodule in the lateral right upper lobe max SUV 2.4.  These are minimally progressive from more remote prior scans.  6 mm left lower lobe nodule unchanged.  No hypermetabolic thoracic adenopathy. -CT chest on 03/23/2020 showed many of the lung nodules are stable.  Couple  of nodules have grown by few millimeters.  No new areas seen. - SBRT to the right and left lung lesions, 54 Gray in 3 fractions from 09/10/2020 through 09/17/2020. - SBRT to the RUL and LUL lesions from 06/19/2022 through 06/26/2022 by Dr. Roselind Messier.   2.  Normocytic to macrocytic anemia: - Found to have hemoglobin 6.3 on 10/09/2020 and received 2 units PRBC while hospitalized.  I have reviewed hospitalization records. - EGD on 09/06/2020 with tiny gastric ulcer without bleeding or stigmata. - Blood loss this year most likely postoperative in nature (urological surgery in February and vascular surgery in May).    Plan: 1.  Stage IIIa (T1b N2 cM0) NSCLC, favoring adenocarcinoma: - He recently had COPD exacerbation and was treated with steroids which he completed yesterday. - Reviewed CT chest from 03/24/2023: Right lower lobe nodule measures 1.8 x 1.8 cm, previously 1.1 x 1.1 cm, present at the area of postradiation fibrosis in the right infrahilar region.  Right lower lobe nodule more superiorly and laterally is stable at 1.6 x 2 cm.  Posterior left upper lobe adjacent nodules slightly increased measuring 1.4 x 1.3 cm, compared to 1.2 x 1.1 cm.  No new nodules were seen.  New right pleural effusion seen.  Multiple sites of evolving radiation change, making evaluation for residual/recurrent disease challenging. - Recommend repeating CT scan of the chest with contrast in 3 months.  If inconclusive, will consider PET scan.   2.  Normocytic to macrocytic anemia: - He was recently hospitalized for anemia. - He is taking iron tablets which causes constipation.  Ferritin today is low at 29.  Hemoglobin is 10.7. - Recommend parenteral iron therapy with Monoferric 1 g IV x 1.  We discussed side effects including rare chance  of anaphylactic reaction.  All the medications listed under his allergies are more like intolerance.  Hence I am not using premedications.    Orders Placed This Encounter  Procedures   CT  CHEST W CONTRAST    Standing Status:   Future    Expected Date:   06/29/2023    Expiration Date:   03/30/2024    If indicated for the ordered procedure, I authorize the administration of contrast media per Radiology protocol:   Yes    Does the patient have a contrast media/X-ray dye allergy?:   No    Preferred imaging location?:   [facility]   CBC with Differential    Standing Status:   Future    Expected Date:   06/22/2023    Expiration Date:   03/30/2024   Comprehensive metabolic panel    Standing Status:   Future    Expected Date:   06/22/2023    Expiration Date:   03/30/2024   Iron and TIBC ([facility] ONLY)    Standing Status:   Future    Expected Date:   06/22/2023    Expiration Date:   03/30/2024   Ferritin    Standing Status:   Future    Expected Date:   06/22/2023    Expiration Date:   03/30/2024      John [name],acting as a scribe for Doreatha Massed, MD.,have documented all relevant documentation on the behalf of Doreatha Massed, MD,as directed by  Doreatha Massed, MD while in the presence of Doreatha Massed, MD.  I, Doreatha Massed MD, have reviewed the above documentation for accuracy and completeness, and I agree with the above.     Doreatha Massed, MD   12/24/202412:11 PM  CHIEF COMPLAINT:   Diagnosis: right NSCLC    Cancer Staging  Non-small cell lung cancer, right [facility] Staging form: Lung, AJCC 8th Edition - Clinical stage from 06/05/2016: Stage IIIA (cT1b(2), cN2, cM0) - Signed by Ellouise Newer, PA-C on 08/21/2016    Prior Therapy: 1. Chemoradiation with weekly carboplatin and paclitaxel, 06/25/16 - 08/20/16. 2. Consolidation durvalumab, 09/26/16 - 09/25/17. 3. SBRT to RUL, LUL nodules 09/10/20 - 09/17/20 4. SBRT to RUL, LUL nodules 06/19/22 - 06/26/22  Current Therapy:  surveillance    HISTORY OF PRESENT ILLNESS:   Oncology History  Non-small cell lung cancer, right (HCC)  04/17/2016 Imaging   CT  chest- 1. Examination is positive for bilateral upper lobe pulmonary lesions suspicious for malignancy. Further investigation with PET-CT and tissue sampling is advised. 2. Borderline enlarged paratracheal lymph nodes and right hilar nodes. Cannot rule out metastatic adenopathy. Attention to these areas on PET-CT is advised. 3. Emphysema 4. Aortic atherosclerosis and multi vessel coronary artery calcification.   04/18/2016 Initial Diagnosis   Adenocarcinoma of lung, right (HCC)   04/24/2016 PET scan   1. Adjacent hypermetabolic nodules in the RIGHT upper lobe consistent bronchogenic carcinoma. 2. Suspicion of ipsilateral hilar and paratracheal nodal metastasis. 3. Nodule in the LEFT upper lobe with suspicious morphology has a relatively low metabolic activity for size. Favor synchronous primary bronchogenic carcinoma. 4. No metabolic activity associated with a small LEFT lower lobe pulmonary nodule. Recommend attention on follow-up.   05/06/2016 Imaging   MRI brain- Negative for metastatic disease.   05/28/2016 Procedure   1. Electromagnetic navigational bronchoscopy with brushings, needle     aspirations and biopsies of bilateral upper lobe nodules. 2. Endobronchial ultrasound with mediastinal and hilar lymph node     aspirations.  By Dr. Dorris Fetch   06/03/2016 Pathology Results   Diagnosis 1. Lung, biopsy, Right upper lobe - BLOOD AND BENIGN BRONCHIAL AND LUNG TISSUE. 2. Lung, biopsy, Left upper lobe - BLOOD AND BENIGN BRONCHIAL AND LUNG TISSUE.   06/03/2016 Pathology Results   Diagnosis TRANSBRONCHIAL NEEDLE ASPIRATION, NAVIGATION, RUL (SPECIMEN 5 OF 7, COLLECTED 05/28/16): MALIGNANT CELLS CONSISTENT WITH NON-SMALL CELL CARCINOMA Comment There are limited tumor cells in the cell block (insufficient for molecular testing). TTF-1 is positive in a few of the atypical cells. They appear negative for synaptophysin and cytokeratin 5/6. Overall there is limited tumor, but a  lung adenocarcinoma is slightly favored. Dr. Luisa Hart has reviewed the case. The case was called to Dr. Dorris Fetch on 06/03/2016.   06/13/2016 Procedure   Port placed by Dr. Lovell Sheehan   06/25/2016 - 08/15/2016 Chemotherapy   The patient had palonosetron (ALOXI) injection 0.25 mg, 0.25 mg, Intravenous,  Once, 6 of 6 cycles  CARBOplatin (PARAPLATIN) 220 mg in sodium chloride 0.9 % 250 mL chemo infusion, 220 mg (100 % of original dose 215.8 mg), Intravenous,  Once, 6 of 6 cycles Dose modification:   (original dose 215.8 mg, Cycle 1),   (original dose 215.8 mg, Cycle 5),   (original dose 215.8 mg, Cycle 6),   (original dose 215.8 mg, Cycle 2),   (original dose 215.8 mg, Cycle 3),   (original dose 215.8 mg, Cycle 4)  PACLitaxel (TAXOL) 90 mg in dextrose 5 % 250 mL chemo infusion ( for chemotherapy treatment.     06/25/2016 - 08/20/2016 Radiation Therapy   Radiation in [place], [place] by Dr. Louis Matte.  R lung to 6600 cGy   07/09/2016 - 07/10/2016 Hospital Admission   Admit date: 07/09/2016  Admission diagnosis: Fever and chills Additional comments: Negative work-up, discharged with course of Levaquin   07/09/2016 Treatment Plan Change   Treatment deferred x 1 week due to hospitalization    08/05/2016 - 08/06/2016 Hospital Admission   Admit date: 08/05/2016 Admission diagnosis: Joint pain Additional comments: Treated with steroids with symptomatic improvement   08/25/2016 Imaging   MRI brain- Negative for intracranial metastasis on this motion degraded examination.  Stable mild-to-moderate chronic small vessel ischemic disease.   09/18/2016 PET scan   1. Partial metabolic response. Right upper lobe 2.6 cm hypermetabolic pulmonary nodule, decreased in size and metabolism. Mildly hypermetabolic right hilar nodal metastasis, decreased in metabolism. Right paratracheal lymph node is no longer hypermetabolic. No new or progressive metastatic disease. 2. Apical left upper lobe 0.9 cm solid pulmonary nodule,  slightly decreased in size, with no significant metabolism. The change in size could indicate a neoplastic etiology for this nodule. 3. Additional subcentimeter pulmonary nodules in the left lung are stable and below PET resolution . 4. Additional findings include aortic atherosclerosis, coronary atherosclerosis, moderate emphysema, stable small pericardial effusion/thickening and mild sigmoid diverticulosis.   01/13/2017 Imaging   CT chest with contrast IMPRESSION: 1. No change in size of the hypermetabolic nodular thickening in the RIGHT upper lobe. 2. LEFT upper lobe spiculated nodule is stable. 3. Rounded LEFT lower lobe pulmonary nodule is stable. 4. No evidence of lung cancer progression.   05/08/2017 Adverse Reaction   Development of Hypothyroidism- likely secondary to immunotherapy.  Levothyroxine therapy initiated.       INTERVAL HISTORY:   John [name] is a 77 y.o. male presenting to clinic today for follow up of right NSCLC. He was last seen by me on 12/1722.  Since his last visit, he was admitted to the hospital  from 02/17/23 to 02/19/23 for symptomatic anemia. He had 1 unit of PRBC transfused. He underwent a small bowel endoscopy with a repair done on 11/13 and a capsule study on 11/14. Prior to this he had right lower extremity arteriogram, angioplasty and stent of right SFA, and right external iliac artery-by Dr. Chestine Spore on 10/31.   He had a CT chest on 03/24/23 that found: multiple sites of evolving radiation change, making evaluation for residual/recurrent disease challenging; areas of progressive nodularity in the left upper and right lower lobe, highly suspicious for disease progression; left apical and left lower lobe pulmonary nodules are unchanged and can be re-evaluated on follow-up; increase in small right pleural effusion; no thoracic adenopathy; and probable hepatic steatosis and caudate lobe enlargement, cannot exclude mild cirrhosis.  Today, he states that he is doing  well overall. His appetite level is at 60%. His energy level is at 50-60%. He is accompanied by his wife. He recently was put on Prednisone that he finished yesterday due to COPD exacerbation. His COPD is under control now. He was also put on iron supplements OD, which give him constipation.   He reports inability to sleep for longer than a few hours at a time and ran out of his sleep medication 1 week ago.  PAST MEDICAL HISTORY:   Past 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   Anemia    Anxiety    Arthritis    Asthma    Atrial flutter (HCC)    BPH (benign prostatic hyperplasia)    with urinary retention 02/06/20   CHF (congestive heart failure) (HCC)    COPD (chronic obstructive pulmonary disease) (HCC)    Depression    Diabetes mellitus, type II (HCC)    Dyspnea    History of kidney stones    History of radiation therapy    right lung 09/10/2020-09/17/2020   Dr Roselind Messier   History of radiation therapy    Left and right Lung- 06/19/22-06/26/22   Hyperlipidemia    Hypertension    Hypothyroidism    Macular degeneration    Neuropathy    Non-small cell lung cancer, right (HCC) 04/18/2016   Peripheral vascular disease (HCC)    Prostatitis    Pulmonary nodule, left 07/16/2016   Sleep apnea    cpap    Surgical History: Past Surgical History:  Procedure Laterality Date   A-FLUTTER ABLATION N/A 01/13/2022   Procedure: A-FLUTTER ABLATION;  Surgeon: Lanier Prude, MD;  Location: [facility];  Service: Cardiovascular;  Laterality: N/A;   ABDOMINAL AORTOGRAM W/LOWER EXTREMITY Left 02/06/2020   Procedure: ABDOMINAL AORTOGRAM W/LOWER EXTREMITY;  Surgeon: Runell Gess, MD;  Location: [facility];  Service: Cardiovascular;  Laterality: Left;   ABDOMINAL AORTOGRAM W/LOWER EXTREMITY N/A 05/30/2021   Procedure: ABDOMINAL AORTOGRAM W/LOWER EXTREMITY;  Surgeon: Cephus Shelling, MD;  Location: [facility];  Service: Cardiovascular;  Laterality: N/A;   ABDOMINAL AORTOGRAM W/LOWER EXTREMITY N/A 02/05/2023   Procedure: ABDOMINAL AORTOGRAM W/LOWER EXTREMITY;  Surgeon: Cephus Shelling, MD;  Location: [facility];  Service: Cardiovascular;  Laterality: N/A;   BIOPSY  09/16/2021   Procedure: BIOPSY;  Surgeon: Iva Boop, MD;  Location: [facility] ENDOSCOPY;  Service: Gastroenterology;;   CARDIOVERSION N/A 10/05/2020   Procedure: CARDIOVERSION;  Surgeon: Sande Rives, MD;  Location: [facility]COPY;  Service: Cardiovascular;  Laterality: N/A;   CATARACT EXTRACTION, BILATERAL Bilateral  COLONOSCOPY WITH PROPOFOL N/A 06/20/2021   Procedure: COLONOSCOPY WITH PROPOFOL;  Surgeon: Hilarie Fredrickson, MD;  Location: [facility] ENDOSCOPY;  Service: Endoscopy;  Laterality: N/A;   COLONOSCOPY WITH PROPOFOL N/A 09/16/2021   Procedure: COLONOSCOPY WITH PROPOFOL;  Surgeon: Iva Boop, MD;  Location: [facility] ENDOSCOPY;  Service: Gastroenterology;  Laterality: N/A;   ENDARTERECTOMY FEMORAL Right 08/20/2020   Procedure: ENDARTERECTOMY  RIGHT FEMORAL ARTERY;  Surgeon: Cephus Shelling, MD;  Location: [facility] OR;  Service: Vascular;  Laterality: Right;   ENTEROSCOPY N/A 10/11/2021   Procedure: ENTEROSCOPY;  Surgeon: Dolores Frame, MD;  Location: [facility] ENDO SU[facility];  Service: Gastroenterology;  Laterality: N/A;   ENTEROSCOPY N/A 02/18/2023   Procedure: ENTEROSCOPY;  Surgeon: Corbin Ade, MD;  Location: [facility];  Service: Endoscopy;  Laterality: N/A;  EGD with possible small bowel push enteroscopy   ESOPHAGOGASTRODUODENOSCOPY N/A 09/16/2021   Procedure: ESOPHAGOGASTRODUODENOSCOPY (EGD);  Surgeon: Iva Boop, MD;  Location: [facility] ENDOSCOPY;  Service: Gastroenterology;  Laterality: N/A;   ESOPHAGOGASTRODUODENOSCOPY (EGD) WITH PROPOFOL N/A 09/06/2020   Procedure: ESOPHAGOGASTRODUODENOSCOPY (EGD) WITH PROPOFOL;  Surgeon: Hilarie Fredrickson, MD;  Location: [facility] ENDOSCOPY;  Service: Endoscopy;  Laterality: N/A;    ESOPHAGOGASTRODUODENOSCOPY (EGD) WITH PROPOFOL N/A 10/11/2021   Procedure: ESOPHAGOGASTRODUODENOSCOPY (EGD) WITH PROPOFOL;  Surgeon: Dolores Frame, MD;  [facility];  Service: Gastroenterology;  Laterality: N/A;   ESOPHAGOGASTRODUODENOSCOPY (EGD) WITH PROPOFOL N/A 02/18/2023   Procedure: ESOPHAGOGASTRODUODENOSCOPY (EGD) WITH PROPOFOL;  Surgeon: Corbin Ade, MD;  [facility]: [facility];  Service: Endoscopy;  Laterality: N/A;   GIVENS CAPSULE STUDY N/A 02/19/2023   Procedure: GIVENS CAPSULE STUDY;  Surgeon: Franky Macho, MD;  Location: [facility];  Service: Endoscopy;  Laterality: N/A;   HEMOSTASIS CLIP PLACEMENT  09/16/2021   Procedure: HEMOSTASIS CLIP PLACEMENT;  Surgeon: Iva Boop, MD;  Location: [facility] ENDOSCOPY;  Service: Gastroenterology;;   HOT HEMOSTASIS N/A 09/16/2021   Procedure: HOT HEMOSTASIS (ARGON PLASMA COAGULATION/BICAP);  Surgeon: Iva Boop, MD;  Location: [facility] ENDOSCOPY;  Service: Gastroenterology;  Laterality: N/A;   HOT HEMOSTASIS  10/11/2021   Procedure: HOT HEMOSTASIS (ARGON PLASMA COAGULATION/BICAP);  Surgeon: Marguerita Merles, Reuel Boom, MD;  Location: [facility];  Service: Gastroenterology;;   HOT HEMOSTASIS  02/18/2023   Procedure: HOT HEMOSTASIS (ARGON PLASMA COAGULATION/BICAP);  Surgeon: Corbin Ade, MD;  [facility]: [facility];  Service: Endoscopy;;   INSERTION OF ILIAC STENT Right 08/20/2020   Procedure: RETROGRADE INSERTION OF RIGHT ILIAC STENT;  Surgeon: Cephus Shelling, MD;  Location: [facility] OR;  Service: Vascular;  Laterality: Right;   INTRAOPERATIVE ARTERIOGRAM Right 08/20/2020   Procedure: INTRA OPERATIVE ARTERIOGRAM ILIAC;  Surgeon: Cephus Shelling, MD;  Location: [facility] OR;  Service: Vascular;  Laterality: Right;   PATCH ANGIOPLASTY Right 08/20/2020   Procedure: PATCH ANGIOPLASTY RIGHT FEMORAL ARTERY;  Surgeon: Cephus Shelling, MD;  Location: [facility] OR;  Service: Vascular;  Laterality: Right;   PERIPHERAL VASCULAR  BALLOON ANGIOPLASTY Right 05/30/2021   Procedure: PERIPHERAL VASCULAR BALLOON ANGIOPLASTY;  Surgeon: Cephus Shelling, MD;  [facility];  Service: Cardiovascular;  Laterality: Right;   PERIPHERAL VASCULAR INTERVENTION Right 02/05/2023   Procedure: PERIPHERAL VASCULAR INTERVENTION;  Surgeon: Cephus Shelling, MD;  [facility];  Service: Cardiovascular;  Laterality: Right;  Rt SFA and Rt Ext Iliac   PORTACATH PLACEMENT Left 06/13/2016   Procedure: INSERTION PORT-A-CATH;  Surgeon: Franky Macho, MD;  Location: [facility] ORS;  Service: General;  Laterality: Left;  TRANSURETHRAL RESECTION OF PROSTATE N/A 05/31/2020   Procedure: TRANSURETHRAL RESECTION OF THE PROSTATE (TURP);  Surgeon: Malen Gauze, MD;  Location: [facility] OR[facility];  Service: Urology;  Laterality: N/A;   VIDEO BRONCHOSCOPY WITH ENDOBRONCHIAL NAVIGATION N/A 05/28/2016   Procedure: VIDEO BRONCHOSCOPY WITH ENDOBRONCHIAL NAVIGATION;  Surgeon: Loreli Slot, MD;  Location: [facility] OR;  Service: Thoracic;  Laterality: N/A;   VIDEO BRONCHOSCOPY WITH ENDOBRONCHIAL ULTRASOUND N/A 05/28/2016   Procedure: VIDEO BRONCHOSCOPY WITH ENDOBRONCHIAL ULTRASOUND;  Surgeon: Loreli Slot, MD;  Location: [facility] OR;  Service: Thoracic;  Laterality: N/A;    Social History: Social History   Socioeconomic History   Marital status: Significant Other    Spouse name: Not on file   Number of children: 5   Years of education: 10   Highest education level: 10th grade  Occupational History   Occupation: Retired  Tobacco Use   Smoking status: Every Day    Current packs/day: 0.50    Average packs/day: 0.5 packs/day for 62.1 years (31.0 ttl pk-yrs)    Types: Cigarettes    Start date: 03/11/1961   Smokeless tobacco: Never   Tobacco comments:    1/2 pack to 1 pack per day 12/14/19  Vaping Use   Vaping status: Never Used  Substance and Sexual Activity   Alcohol use: No   Drug use: No   Sexual activity: Yes    Birth  control/protection: None  Other Topics Concern   Not on file  Social History Narrative   [name] is his POA and lives with him. Children out of town - one in [facility], others in [place].   Social Drivers of Corporate investment banker Strain: Low Risk  (11/19/2022)   Overall Financial Resource Strain (CARDIA)    Difficulty of Paying Living Expenses: Not hard at all  Food Insecurity: No Food Insecurity (02/20/2023)   Hunger Vital Sign    Worried About Running Out of Food in the Last Year: Never true    Ran Out of Food in the Last Year: Never true  Transportation Needs: No Transportation Needs (02/20/2023)   PRAPARE - Administrator, Civil Service (Medical): No    Lack of Transportation (Non-Medical): No  Physical Activity: Insufficiently Active (11/19/2022)   Exercise Vital Sign    Days of Exercise per Week: 2 days    Minutes of Exercise per Session: 30 min  Stress: No Stress Concern Present (11/19/2022)   [employer] of Occupational Health - Occupational Stress Questionnaire    Feeling of Stress : Not at all  Social Connections: Socially Isolated (11/19/2022)   Social Connection and Isolation Panel [NHANES]    Frequency of Communication with Friends and Family: More than three times a week    Frequency of Social Gatherings with Friends and Family: Never    Attends Religious Services: Never    Database administrator or Organizations: No    Attends Banker Meetings: Never    Marital Status: Divorced  Catering manager Violence: Not At Risk (02/20/2023)   Humiliation, Afraid, Rape, and Kick questionnaire    Fear of Current or Ex-Partner: No    Emotionally Abused: No    Physically Abused: No    Sexually Abused: No    Family History: Family History  Problem Relation Age of Onset   Hypertension Mother    Diabetes Father    Heart disease Father    Stroke Father    Hypertension Sister    Sleep apnea  Neg Hx     Current Medications:  Current  Outpatient Medications:    albuterol (VENTOLIN HFA) 108 (90 Base) MCG/ACT inhaler, INHALE 2 PUFFS INTO THE LUNGS EVERY 6 HOURS AS NEEDED FOR WHEEZE OR SHORTNESS OF BREATH, Disp: 8.5 each, Rfl: 5   allopurinol (ZYLOPRIM) 300 MG tablet, TAKE 1 TABLET BY MOUTH EVERY DAY, Disp: 90 tablet, Rfl: 0   aspirin EC 81 MG tablet, Take 1 tablet (81 mg total) by mouth daily. Swallow whole., Disp: 90 tablet, Rfl: 3   bisoprolol (ZEBETA) 5 MG tablet, Take 1 tablet (5 mg total) by mouth daily., Disp: 30 tablet, Rfl: 1   Blood Glucose Monitoring Suppl (ONETOUCH VERIO REFLECT) w/Device KIT, Use to test blood sugars daily as directed. DX: E11.9, Disp: 1 kit, Rfl: 1   clopidogrel (PLAVIX) 75 MG tablet, Take 1 tablet (75 mg total) by mouth daily., Disp: 30 tablet, Rfl: 11   Continuous Glucose Receiver (FREESTYLE LIBRE 3 READER) DEVI, 1 Device by Does not apply route every 14 (fourteen) days., Disp: 1 each, Rfl: 3   Continuous Glucose Sensor (FREESTYLE LIBRE 3 SENSOR) MISC, 1 Device by Does not apply route continuous., Disp: 1 each, Rfl: 2   docusate sodium (COLACE) 100 MG capsule, Take 1 capsule (100 mg total) by mouth 2 (two) times daily., Disp: 60 capsule, Rfl: 2   dutasteride (AVODART) 0.5 MG capsule, Take 1 capsule (0.5 mg total) by mouth daily., Disp: 90 capsule, Rfl: 3   famotidine (PEPCID) 20 MG tablet, Take 1 tablet (20 mg total) by mouth daily., Disp: 30 tablet, Rfl: 1   ferrous sulfate 325 (65 FE) MG EC tablet, Take 1 tablet (325 mg total) by mouth 2 (two) times daily., Disp: 60 tablet, Rfl: 3   Fluticasone-Umeclidin-Vilant (TRELEGY ELLIPTA) 100-62.5-25 MCG/ACT AEPB, Inhale 1 puff into the lungs daily., Disp: 1 each, Rfl: 11   gabapentin (NEURONTIN) 300 MG capsule, TAKE 1 CAPSULE BY MOUTH THREE TIMES A DAY, Disp: 270 capsule, Rfl: 0   insulin degludec (TRESIBA FLEXTOUCH) 200 UNIT/ML FlexTouch Pen, Inject 16 Units into the skin every evening., Disp: 12 mL, Rfl: 3   Insulin Pen Needle (BD PEN NEEDLE NANO U/F) 32G  X 4 MM MISC, Use to inject insulin once daily, Disp: 100 each, Rfl: 3   levothyroxine (SYNTHROID) 50 MCG tablet, TAKE 1 TABLET BY MOUTH EVERY DAY, Disp: 90 tablet, Rfl: 1   linaclotide (LINZESS) 72 MCG capsule, Take 1 capsule (72 mcg total) by mouth daily before breakfast., Disp: 90 capsule, Rfl: 1   metolazone (ZAROXOLYN) 2.5 MG tablet, Take 2.5 mg by mouth 3 (three) times a week., Disp: , Rfl:    NON FORMULARY, CPAP at bedtime, Disp: , Rfl:    ondansetron (ZOFRAN) 4 MG tablet, Take 1 tablet (4 mg total) by mouth every 8 (eight) hours as needed for nausea or vomiting., Disp: 90 tablet, Rfl: 1   OneTouch Delica Lancets 30G MISC, Use to test blood sugars daily as directed. DX: E11.9, Disp: 100 each, Rfl: 1   OXYGEN, Inhale 2 L into the lungs daily as needed (with Cpap at night)., Disp: , Rfl:    pantoprazole (PROTONIX) 40 MG tablet, Take 1 tablet (40 mg total) by mouth daily., Disp: 30 tablet, Rfl: 3   pravastatin (PRAVACHOL) 40 MG tablet, TAKE 1 TABLET (40 MG TOTAL) BY MOUTH ON MONDAYS , WEDNESDAYS, AND FRIDAYS AS DIRECTED, Disp: 48 tablet, Rfl: 1   predniSONE (DELTASONE) 20 MG tablet, 2 po at same time daily for 5  days, Disp: 10 tablet, Rfl: 0   torsemide (DEMADEX) 20 MG tablet, Take 1 tablet (20 mg total) by mouth daily. (Patient taking differently: Take 20 mg by mouth once.), Disp: 90 tablet, Rfl: 2   torsemide (DEMADEX) 20 MG tablet, Take 20 mg by mouth daily. Take 1 Tablet Daily, Disp: , Rfl:    zolpidem (AMBIEN) 5 MG tablet, Take 1 tablet (5 mg total) by mouth at bedtime as needed for sleep. (Patient taking differently: Take 5 mg by mouth at bedtime.), Disp: 30 tablet, Rfl: 2   spironolactone (ALDACTONE) 25 MG tablet, Take 0.5 tablets (12.5 mg total) by mouth daily., Disp: 45 tablet, Rfl: 3   Allergies: Allergies  Allergen Reactions   Sulfa Antibiotics Swelling    Mouth swelling   Atorvastatin Other (See Comments)    Muscle aches - tolerating Pravastatin 40 mg MWF   Jardiance  [Empagliflozin] Other (See Comments)    FEELS SLUGGISH, TIRED   Lopressor [Metoprolol] Other (See Comments)    Fatigue   Rosuvastatin Other (See Comments)    Muscle aches - tolerating Pravastatin 40 mg MWF   Doxycycline Nausea Only   Tramadol Itching   Temazepam Other (See Comments)    Made insomnia worse     REVIEW OF SYSTEMS:   Review of Systems  Constitutional:  Negative for chills, fatigue and fever.  HENT:   Negative for lump/mass, mouth sores, nosebleeds, sore throat and trouble swallowing.   Eyes:  Negative for eye problems.  Respiratory:  Positive for cough and shortness of breath.   Cardiovascular:  Positive for palpitations. Negative for chest pain and leg swelling.  Gastrointestinal:  Positive for constipation and nausea. Negative for abdominal pain, diarrhea and vomiting.  Genitourinary:  Negative for bladder incontinence, difficulty urinating, dysuria, frequency, hematuria and nocturia.   Musculoskeletal:  Negative for arthralgias, back pain, flank pain, myalgias and neck pain.  Skin:  Negative for itching and rash.  Neurological:  Positive for numbness. Negative for dizziness and headaches. Seizures: in feet and hands. Hematological:  Does not bruise/bleed easily.  Psychiatric/Behavioral:  Positive for sleep disturbance. Negative for depression and suicidal ideas. The patient is not nervous/anxious.   All other systems reviewed and are negative.    VITALS:   Blood pressure (!) 142/66, pulse (!) 115, temperature 97.7 F (36.5 C), temperature source Oral, resp. rate 18, weight 159 lb 3.2 oz (72.2 kg), SpO2 96%.  Wt Readings from Last 3 Encounters:  03/31/23 159 lb 3.2 oz (72.2 kg)  03/13/23 157 lb (71.2 kg)  03/10/23 159 lb (72.1 kg)    Body mass index is 25.7 kg/m.  Performance status (ECOG): 1 - Symptomatic but completely ambulatory  PHYSICAL EXAM:   Physical Exam Vitals and nursing note reviewed. Exam conducted with a chaperone present.  Constitutional:       Appearance: Normal appearance.  Cardiovascular:     Rate and Rhythm: Normal rate and regular rhythm.     Pulses: Normal pulses.     Heart sounds: Normal heart sounds.  Pulmonary:     Effort: Pulmonary effort is normal.     Breath sounds: Normal breath sounds.  Abdominal:     Palpations: Abdomen is soft. There is no hepatomegaly, splenomegaly or mass.     Tenderness: There is no abdominal tenderness.  Musculoskeletal:     Right lower leg: No edema.     Left lower leg: No edema.  Lymphadenopathy:     Cervical: No cervical adenopathy.     Right  cervical: No superficial, deep or posterior cervical adenopathy.    Left cervical: No superficial, deep or posterior cervical adenopathy.     Upper Body:     Right upper body: No supraclavicular or axillary adenopathy.     Left upper body: No supraclavicular or axillary adenopathy.  Neurological:     General: No focal deficit present.     Mental Status: He is alert and oriented to person, place, and time.  Psychiatric:        Mood and Affect: Mood normal.        Behavior: Behavior normal.     LABS:      Latest Ref Rng & Units 03/24/2023   11:47 AM 02/23/2023   12:18 PM 02/19/2023    4:53 AM  CBC  WBC 4.0 - 10.5 K/uL 7.3  11.9  10.5   Hemoglobin 13.0 - 17.0 g/dL 56.2  8.8 Repeated and verified X2.  8.3   Hematocrit 39.0 - 52.0 % 37.9  29.3  27.9   Platelets 150 - 400 K/uL 289  477.0  455       Latest Ref Rng & Units 03/24/2023   11:47 AM 02/19/2023    4:53 AM 02/18/2023    4:47 AM  CMP  Glucose 70 - 99 mg/dL 130  865  784   BUN 8 - 23 mg/dL 16  19  24    Creatinine 0.61 - 1.24 mg/dL 6.96  2.95  2.84   Sodium 135 - 145 mmol/L 135  137  133   Potassium 3.5 - 5.1 mmol/L 3.9  3.7  3.6   Chloride 98 - 111 mmol/L 100  96  98   CO2 22 - 32 mmol/L 27  31  30    Calcium 8.9 - 10.3 mg/dL 9.1  8.8  8.4   Total Protein 6.5 - 8.1 g/dL 7.1     Total Bilirubin <1.2 mg/dL 0.6     Alkaline Phos 38 - 126 U/L 91     AST 15 - 41 U/L 13      ALT 0 - 44 U/L 11        No results found for: "CEA1", "CEA" / No results found for: "CEA1", "CEA" Lab Results  Component Value Date   PSA1 <0.1 10/31/2022   No results found for: "XLK440" No results found for: "CAN125"  Lab Results  Component Value Date   TOTALPROTELP 7.0 04/18/2021   ALBUMINELP 3.7 04/18/2021   A1GS 0.3 04/18/2021   A2GS 0.8 04/18/2021   BETS 1.2 04/18/2021   GAMS 1.0 04/18/2021   MSPIKE Not Observed 04/18/2021   SPEI Comment 04/18/2021   Lab Results  Component Value Date   TIBC 425 03/24/2023   TIBC 541 (H) 02/17/2023   TIBC 440 04/25/2022   FERRITIN 29 03/24/2023   FERRITIN 10 (L) 02/17/2023   FERRITIN 40 04/25/2022   IRONPCTSAT 49 (H) 03/24/2023   IRONPCTSAT 4 (L) 02/17/2023   IRONPCTSAT 11 (L) 04/25/2022   Lab Results  Component Value Date   LDH 133 08/02/2018   LDH 126 03/08/2018   LDH 141 10/28/2017     STUDIES:   CT Chest W Contrast Result Date: 03/31/2023 CLINICAL DATA:  Non-small-cell lung cancer. Most recent radiation therapy to right upper and left upper lobe lesions finishing 06/26/2022. * Tracking Code: BO * EXAM: CT CHEST WITH CONTRAST TECHNIQUE: Multidetector CT imaging of the chest was performed during intravenous contrast administration. RADIATION DOSE REDUCTION: This exam was performed according to the departmental  dose-optimization program which includes automated exposure control, adjustment of the mA and/or kV according to patient size and/or use of iterative reconstruction technique. CONTRAST:  75mL OMNIPAQUE IOHEXOL 300 MG/ML  SOLN COMPARISON:  12/16/2022.  Clinic note of 12/23/2022 reviewed. FINDINGS: Cardiovascular: Advanced aortic and branch vessel atherosclerosis. Normal heart size, without pericardial effusion. Multivessel coronary artery atherosclerosis. No central pulmonary embolism, on this non-dedicated study. Mediastinum/Nodes: No mediastinal or hilar adenopathy. Lungs/Pleura: Increase in small right pleural  effusion. Moderate centrilobular emphysema. Right lower lobe nodule measures 1.8 x 1.8 cm on 73/3 versus 1.1 x 1.1 cm on 73/3 of the prior exam (when remeasured). 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 Suspect residual or recurrent nodule more superiorly and laterally within the right lower lobe including at 1.6 x 2.0 cm on 87/3, grossly similar to the prior. Increased right perihilar presumably radiation induced consolidation including on 51/3. Left apical nodule measures 7 mm on 24/3, similar. Posterior left upper lobe adjacent nodules, including at 1.4 and 1.3 cm on 34/3 today. Compare 1.2 and 1.1 cm when remeasured in a similar fashion on the prior. Surrounding ground-glass and septal thickening are likely radiation induced. Nodularity extends more caudally today including on 39/3. Left lower lobe pulmonary nodule measures 7 x 7 mm on 83/3 and is similar to 7 x 6 mm on the prior. Upper Abdomen: Probable hepatic steatosis. Moderate caudate lobe enlargement. Normal imaged portions of the spleen, stomach, pancreas, left kidney, right adrenal gland. Left adrenal thickening is similar. Musculoskeletal: Mild bilateral gynecomastia. No acute osseous abnormality. IMPRESSION: 1. Multiple sites of evolving radiation change, making evaluation for residual/recurrent disease challenging. Areas of progressive nodularity in the left upper and right lower lobe, highly suspicious for disease progression. 2. Left apical and left lower lobe pulmonary nodules are unchanged and can be re-evaluated on follow-up. 3. Increase in small right pleural effusion. 4. No thoracic adenopathy. 5. Probable hepatic steatosis and caudate lobe enlargement. Cannot exclude mild cirrhosis. 6. Incidental findings, including: Aortic atherosclerosis (ICD10-I70.0), coronary artery atherosclerosis and emphysema (ICD10-J43.9). Gynecomastia. Electronically Signed   By: Jeronimo Greaves M.D.   On: 03/31/2023 08:47   CUP PACEART REMOTE DEVICE CHECK Result Date: 03/16/2023 ILR summary report received. Battery status OK. Normal device function. No new symptom, tachy, brady, or pause episodes. No new AF episodes.  41 AT detections, EGMs appear as ST, known history, first degree AV block.  Gradual increase in average night HR  on trends, up to approximately 120 bpm. Monthly summary reports and ROV/PRN - CS, CVRS  DG Knee AP/LAT W/Sunrise Right Result Date: 03/05/2023 X-rays of the right knee were obtained in clinic today.  No acute injuries.  Neutral overall alignment.  Mild to moderate loss of joint space.  Minimal osteophytes.  No bony lesions.  Impression: Negative right knee x-ray

## 2023-04-01 DIAGNOSIS — F32A Depression, unspecified: Secondary | ICD-10-CM | POA: Diagnosis not present

## 2023-04-01 DIAGNOSIS — E1151 Type 2 diabetes mellitus with diabetic peripheral angiopathy without gangrene: Secondary | ICD-10-CM | POA: Diagnosis not present

## 2023-04-01 DIAGNOSIS — I4891 Unspecified atrial fibrillation: Secondary | ICD-10-CM | POA: Diagnosis not present

## 2023-04-01 DIAGNOSIS — J4489 Other specified chronic obstructive pulmonary disease: Secondary | ICD-10-CM | POA: Diagnosis not present

## 2023-04-01 DIAGNOSIS — D62 Acute posthemorrhagic anemia: Secondary | ICD-10-CM | POA: Diagnosis not present

## 2023-04-01 DIAGNOSIS — E1142 Type 2 diabetes mellitus with diabetic polyneuropathy: Secondary | ICD-10-CM | POA: Diagnosis not present

## 2023-04-01 DIAGNOSIS — Z48812 Encounter for surgical aftercare following surgery on the circulatory system: Secondary | ICD-10-CM | POA: Diagnosis not present

## 2023-04-01 DIAGNOSIS — I4892 Unspecified atrial flutter: Secondary | ICD-10-CM | POA: Diagnosis not present

## 2023-04-01 DIAGNOSIS — E782 Mixed hyperlipidemia: Secondary | ICD-10-CM | POA: Diagnosis not present

## 2023-04-01 DIAGNOSIS — M150 Primary generalized (osteo)arthritis: Secondary | ICD-10-CM | POA: Diagnosis not present

## 2023-04-01 DIAGNOSIS — N401 Enlarged prostate with lower urinary tract symptoms: Secondary | ICD-10-CM | POA: Diagnosis not present

## 2023-04-01 DIAGNOSIS — E039 Hypothyroidism, unspecified: Secondary | ICD-10-CM | POA: Diagnosis not present

## 2023-04-01 DIAGNOSIS — N138 Other obstructive and reflux uropathy: Secondary | ICD-10-CM | POA: Diagnosis not present

## 2023-04-01 DIAGNOSIS — E1159 Type 2 diabetes mellitus with other circulatory complications: Secondary | ICD-10-CM | POA: Diagnosis not present

## 2023-04-01 DIAGNOSIS — C3491 Malignant neoplasm of unspecified part of right bronchus or lung: Secondary | ICD-10-CM | POA: Diagnosis not present

## 2023-04-01 DIAGNOSIS — F419 Anxiety disorder, unspecified: Secondary | ICD-10-CM | POA: Diagnosis not present

## 2023-04-01 DIAGNOSIS — K8020 Calculus of gallbladder without cholecystitis without obstruction: Secondary | ICD-10-CM | POA: Diagnosis not present

## 2023-04-01 DIAGNOSIS — I700 Atherosclerosis of aorta: Secondary | ICD-10-CM | POA: Diagnosis not present

## 2023-04-01 DIAGNOSIS — E1169 Type 2 diabetes mellitus with other specified complication: Secondary | ICD-10-CM | POA: Diagnosis not present

## 2023-04-01 DIAGNOSIS — F5101 Primary insomnia: Secondary | ICD-10-CM | POA: Diagnosis not present

## 2023-04-01 DIAGNOSIS — I5033 Acute on chronic diastolic (congestive) heart failure: Secondary | ICD-10-CM | POA: Diagnosis not present

## 2023-04-01 DIAGNOSIS — R338 Other retention of urine: Secondary | ICD-10-CM | POA: Diagnosis not present

## 2023-04-01 DIAGNOSIS — I6521 Occlusion and stenosis of right carotid artery: Secondary | ICD-10-CM | POA: Diagnosis not present

## 2023-04-01 DIAGNOSIS — I152 Hypertension secondary to endocrine disorders: Secondary | ICD-10-CM | POA: Diagnosis not present

## 2023-04-01 DIAGNOSIS — F17210 Nicotine dependence, cigarettes, uncomplicated: Secondary | ICD-10-CM | POA: Diagnosis not present

## 2023-04-02 ENCOUNTER — Ambulatory Visit (INDEPENDENT_AMBULATORY_CARE_PROVIDER_SITE_OTHER): Payer: Medicare Other | Admitting: Nurse Practitioner

## 2023-04-02 ENCOUNTER — Encounter: Payer: Self-pay | Admitting: Nurse Practitioner

## 2023-04-02 VITALS — BP 126/68 | HR 108 | Ht 66.0 in | Wt 160.0 lb

## 2023-04-02 DIAGNOSIS — Z794 Long term (current) use of insulin: Secondary | ICD-10-CM | POA: Diagnosis not present

## 2023-04-02 DIAGNOSIS — Z7984 Long term (current) use of oral hypoglycemic drugs: Secondary | ICD-10-CM | POA: Diagnosis not present

## 2023-04-02 DIAGNOSIS — E782 Mixed hyperlipidemia: Secondary | ICD-10-CM

## 2023-04-02 DIAGNOSIS — E1165 Type 2 diabetes mellitus with hyperglycemia: Secondary | ICD-10-CM | POA: Diagnosis not present

## 2023-04-02 DIAGNOSIS — E039 Hypothyroidism, unspecified: Secondary | ICD-10-CM

## 2023-04-02 MED ORDER — ZOLPIDEM TARTRATE 5 MG PO TABS: 5.0000 mg | ORAL_TABLET | Freq: Every evening | ORAL | 2 refills | Status: DC | PRN

## 2023-04-02 MED ORDER — LEVOTHYROXINE SODIUM 50 MCG PO TABS: 50.0000 ug | ORAL_TABLET | Freq: Every day | ORAL | 1 refills | Status: DC

## 2023-04-02 NOTE — Progress Notes (Signed)
Endocrinology Follow Up Note       04/02/2023, 12:05 PM   Subjective:    Patient ID: John [name], male    DOB: 1946/01/30.  John [name] is being seen in follow up after being seen in consultation for management of currently uncontrolled symptomatic diabetes requested by  Junie Spencer, FNP.   Past Medical History:  Diagnosis Date   Adenocarcinoma of lung, right (HCC) 04/18/2016   Anemia    Anxiety    Arthritis    Asthma    Atrial flutter (HCC)    BPH (benign prostatic hyperplasia)    with urinary retention 02/06/20   CHF (congestive heart failure) (HCC)    COPD (chronic obstructive pulmonary disease) (HCC)    Depression    Diabetes mellitus, type II (HCC)    Dyspnea    History of kidney stones    History of radiation therapy    right lung 09/10/2020-09/17/2020   Dr Roselind Messier   History of radiation therapy    Left and right Lung- 06/19/22-06/26/22   Hyperlipidemia    Hypertension    Hypothyroidism    Macular degeneration    Neuropathy    Non-small cell lung cancer, right (HCC) 04/18/2016   Peripheral vascular disease (HCC)    Prostatitis    Pulmonary nodule, left 07/16/2016   Sleep apnea    cpap    Past Surgical History:  Procedure Laterality Date   A-FLUTTER ABLATION N/A 01/13/2022   Procedure: A-FLUTTER ABLATION;  Surgeon: Lanier Prude, MD;  [facility]: [facility];  Service: Cardiovascular;  Laterality: N/A;   ABDOMINAL AORTOGRAM W/LOWER EXTREMITY Left 02/06/2020   Procedure: ABDOMINAL AORTOGRAM W/LOWER EXTREMITY;  Surgeon: Runell Gess, MD;  Location: [facility];  Service: Cardiovascular;  Laterality: Left;   ABDOMINAL AORTOGRAM W/LOWER EXTREMITY N/A 05/30/2021   Procedure: ABDOMINAL AORTOGRAM W/LOWER EXTREMITY;  Surgeon: Cephus Shelling, MD;  Location: [facility];  Service: Cardiovascular;  Laterality: N/A;   ABDOMINAL AORTOGRAM W/LOWER EXTREMITY N/A  02/05/2023   Procedure: ABDOMINAL AORTOGRAM W/LOWER EXTREMITY;  Surgeon: Cephus Shelling, MD;  Location: [facility];  Service: Cardiovascular;  Laterality: N/A;   BIOPSY  09/16/2021   Procedure: BIOPSY;  Surgeon: Iva Boop, MD;  Location: [facility] ENDOSCOPY;  Service: Gastroenterology;;   CARDIOVERSION N/A 10/05/2020   Procedure: CARDIOVERSION;  Surgeon: Sande Rives, MD;  Location: [facility] ENDOSCOPY;  Service: Cardiovascular;  Laterality: N/A;   CATARACT EXTRACTION, BILATERAL Bilateral    COLONOSCOPY WITH PROPOFOL N/A 06/20/2021   Procedure: COLONOSCOPY WITH PROPOFOL;  Surgeon: Hilarie Fredrickson, MD;  Location: [facility] ENDOSCOPY;  Service: Endoscopy;  Laterality: N/A;   COLONOSCOPY WITH PROPOFOL N/A 09/16/2021   Procedure: COLONOSCOPY WITH PROPOFOL;  Surgeon: Iva Boop, MD;  Location: [facility] ENDOSCOPY;  Service: Gastroenterology;  Laterality: N/A;   ENDARTERECTOMY FEMORAL Right 08/20/2020   Procedure: ENDARTERECTOMY  RIGHT FEMORAL ARTERY;  Surgeon: Cephus Shelling, MD;  Location: [name] OR;  Service: Vascular;  Laterality: Right;   ENTEROSCOPY N/A 10/11/2021   Procedure: ENTEROSCOPY;  Surgeon: Dolores Frame, MD;  Location: [facility];  Service: Gastroenterology;  Laterality: N/A;   ENTEROSCOPY N/A 02/18/2023  Procedure: ENTEROSCOPY;  Surgeon: Corbin Ade, MD;  Location: [facility];  Service: Endoscopy;  Laterality: N/A;  EGD with possible small bowel push enteroscopy   ESOPHAGOGASTRODUODENOSCOPY N/A 09/16/2021   Procedure: ESOPHAGOGASTRODUODENOSCOPY (EGD);  Surgeon: Iva Boop, MD;  Location: [facility] ENDOSCOPY;  Service: Gastroenterology;  Laterality: N/A;   ESOPHAGOGASTRODUODENOSCOPY (EGD) WITH PROPOFOL N/A 09/06/2020   Procedure: ESOPHAGOGASTRODUODENOSCOPY (EGD) WITH PROPOFOL;  Surgeon: Hilarie Fredrickson, MD;  Location: [facility] ENDOSCOPY;  Service: Endoscopy;  Laterality: N/A;   ESOPHAGOGASTRODUODENOSCOPY (EGD) WITH PROPOFOL N/A 10/11/2021   Procedure: ESOPHAGOGASTRODUODENOSCOPY  (EGD) WITH PROPOFOL;  Surgeon: Dolores Frame, MD;  [facility]: [facility];  Service: Gastroenterology;  Laterality: N/A;   ESOPHAGOGASTRODUODENOSCOPY (EGD) WITH PROPOFOL N/A 02/18/2023   Procedure: ESOPHAGOGASTRODUODENOSCOPY (EGD) WITH PROPOFOL;  Surgeon: Corbin Ade, MD;  [facility]: [facility];  Service: Endoscopy;  Laterality: N/A;   GIVENS CAPSULE STUDY N/A 02/19/2023   Procedure: GIVENS CAPSULE STUDY;  Surgeon: Franky Macho, MD;  Location: [facility];  Service: Endoscopy;  Laterality: N/A;   HEMOSTASIS CLIP PLACEMENT  09/16/2021   Procedure: HEMOSTASIS CLIP PLACEMENT;  Surgeon: Iva Boop, MD;  Location: [facility] ENDOSCOPY;  Service: Gastroenterology;;   HOT HEMOSTASIS N/A 09/16/2021   Procedure: HOT HEMOSTASIS (ARGON PLASMA COAGULATION/BICAP);  Surgeon: Iva Boop, MD;  Location: [facility] ENDOSCOPY;  Service: Gastroenterology;  Laterality: N/A;   HOT HEMOSTASIS  10/11/2021   Procedure: HOT HEMOSTASIS (ARGON PLASMA COAGULATION/BICAP);  Surgeon: Marguerita Merles, Reuel Boom, MD;  Location: [facility];  Service: Gastroenterology;;   HOT HEMOSTASIS  02/18/2023   Procedure: HOT HEMOSTASIS (ARGON PLASMA COAGULATION/BICAP);  Surgeon: Corbin Ade, MD;  [facility]: [facility];  Service: Endoscopy;;   INSERTION OF ILIAC STENT Right 08/20/2020   Procedure: RETROGRADE INSERTION OF RIGHT ILIAC STENT;  Surgeon: Cephus Shelling, MD;  Location: [facility] OR;  Service: Vascular;  Laterality: Right;   INTRAOPERATIVE ARTERIOGRAM Right 08/20/2020   Procedure: INTRA OPERATIVE ARTERIOGRAM ILIAC;  Surgeon: Cephus Shelling, MD;  Location: [facility] OR;  Service: Vascular;  Laterality: Right;   PATCH ANGIOPLASTY Right 08/20/2020   Procedure: PATCH ANGIOPLASTY RIGHT FEMORAL ARTERY;  Surgeon: Cephus Shelling, MD;  Location: [facility] OR;  Service: Vascular;  Laterality: Right;   PERIPHERAL VASCULAR BALLOON ANGIOPLASTY Right 05/30/2021   Procedure: PERIPHERAL VASCULAR BALLOON ANGIOPLASTY;  Surgeon:  Cephus Shelling, MD;  [facility];  Service: Cardiovascular;  Laterality: Right;   PERIPHERAL VASCULAR INTERVENTION Right 02/05/2023   Procedure: PERIPHERAL VASCULAR INTERVENTION;  Surgeon: Cephus Shelling, MD;  [facility]: [facility];  Service: Cardiovascular;  Laterality: Right;  Rt SFA and Rt Ext Iliac   PORTACATH PLACEMENT Left 06/13/2016   Procedure: INSERTION PORT-A-CATH;  Surgeon: Franky Macho, MD;  Location: [facility] ORS;  Service: General;  Laterality: Left;   TRANSURETHRAL RESECTION OF PROSTATE N/A 05/31/2020   Procedure: TRANSURETHRAL RESECTION OF THE PROSTATE (TURP);  Surgeon: Malen Gauze, MD;  Location: [facility];  Service: Urology;  Laterality: N/A;   VIDEO BRONCHOSCOPY WITH ENDOBRONCHIAL NAVIGATION N/A 05/28/2016   Procedure: VIDEO BRONCHOSCOPY WITH ENDOBRONCHIAL NAVIGATION;  Surgeon: Loreli Slot, MD;  Location: [facility] OR;  Service: Thoracic;  Laterality: N/A;   VIDEO BRONCHOSCOPY WITH ENDOBRONCHIAL ULTRASOUND N/A 05/28/2016   Procedure: VIDEO BRONCHOSCOPY WITH ENDOBRONCHIAL ULTRASOUND;  Surgeon: Loreli Slot, MD;  Location: [facility] OR;  Service: Thoracic;  Laterality: N/A;    Social History   Socioeconomic History   Marital status: Significant Other    Spouse name:  Not on file   Number of children: 5   Years of education: 10   Highest education level: 10th grade  Occupational History   Occupation: Retired  Tobacco Use   Smoking status: Every Day    Current packs/day: 0.50    Average packs/day: 0.5 packs/day for 62.1 years (31.0 ttl pk-yrs)    Types: Cigarettes    Start date: 03/11/1961   Smokeless tobacco: Never   Tobacco comments:    1/2 pack to 1 pack per day 12/14/19  Vaping Use   Vaping status: Never Used  Substance and Sexual Activity   Alcohol use: No   Drug use: No   Sexual activity: Yes    Birth control/protection: None  Other Topics Concern   Not on file  Social History Narrative   [name] is his POA and lives with him.  Children out of town - one in [facility], others in [place].   Social Drivers of Corporate investment banker Strain: Low Risk  (11/19/2022)   Overall Financial Resource Strain (CARDIA)    Difficulty of Paying Living Expenses: Not hard at all  Food Insecurity: No Food Insecurity (02/20/2023)   Hunger Vital Sign    Worried About Running Out of Food in the Last Year: Never true    Ran Out of Food in the Last Year: Never true  Transportation Needs: No Transportation Needs (02/20/2023)   PRAPARE - Administrator, Civil Service (Medical): No    Lack of Transportation (Non-Medical): No  Physical Activity: Insufficiently Active (11/19/2022)   Exercise Vital Sign    Days of Exercise per Week: 2 days    Minutes of Exercise per Session: 30 min  Stress: No Stress Concern Present (11/19/2022)   [employer] of Occupational Health - Occupational Stress Questionnaire    Feeling of Stress : Not at all  Social Connections: Socially Isolated (11/19/2022)   Social Connection and Isolation Panel [NHANES]    Frequency of Communication with Friends and Family: More than three times a week    Frequency of Social Gatherings with Friends and Family: Never    Attends Religious Services: Never    Database administrator or Organizations: No    Attends Engineer, structural: Never    Marital Status: Divorced    Family History  Problem Relation Age of Onset   Hypertension Mother    Diabetes Father    Heart disease Father    Stroke Father    Hypertension Sister    Sleep apnea Neg Hx     Outpatient Encounter Medications as of 04/02/2023  Medication Sig   albuterol (VENTOLIN HFA) 108 (90 Base) MCG/ACT inhaler INHALE 2 PUFFS INTO THE LUNGS EVERY 6 HOURS AS NEEDED FOR WHEEZE OR SHORTNESS OF BREATH   allopurinol (ZYLOPRIM) 300 MG tablet TAKE 1 TABLET BY MOUTH EVERY DAY   aspirin EC 81 MG tablet Take 1 tablet (81 mg total) by mouth daily. Swallow whole.   bisoprolol (ZEBETA) 5 MG  tablet Take 1 tablet (5 mg total) by mouth daily.   Blood Glucose Monitoring Suppl (ONETOUCH VERIO REFLECT) w/Device KIT Use to test blood sugars daily as directed. DX: E11.9   clopidogrel (PLAVIX) 75 MG tablet Take 1 tablet (75 mg total) by mouth daily.   Continuous Glucose Receiver (FREESTYLE LIBRE 3 READER) DEVI 1 Device by Does not apply route every 14 (fourteen) days.   Continuous Glucose Sensor (FREESTYLE LIBRE 3 SENSOR) MISC 1 Device by Does not  apply route continuous.   docusate sodium (COLACE) 100 MG capsule Take 1 capsule (100 mg total) by mouth 2 (two) times daily.   dutasteride (AVODART) 0.5 MG capsule Take 1 capsule (0.5 mg total) by mouth daily.   famotidine (PEPCID) 20 MG tablet Take 1 tablet (20 mg total) by mouth daily.   ferrous sulfate 325 (65 FE) MG EC tablet Take 1 tablet (325 mg total) by mouth 2 (two) times daily.   Fluticasone-Umeclidin-Vilant (TRELEGY ELLIPTA) 100-62.5-25 MCG/ACT AEPB Inhale 1 puff into the lungs daily.   gabapentin (NEURONTIN) 300 MG capsule TAKE 1 CAPSULE BY MOUTH THREE TIMES A DAY   insulin degludec (TRESIBA FLEXTOUCH) 200 UNIT/ML FlexTouch Pen Inject 16 Units into the skin every evening.   Insulin Pen Needle (BD PEN NEEDLE NANO U/F) 32G X 4 MM MISC Use to inject insulin once daily   linaclotide (LINZESS) 72 MCG capsule Take 1 capsule (72 mcg total) by mouth daily before breakfast.   metolazone (ZAROXOLYN) 2.5 MG tablet Take 2.5 mg by mouth 3 (three) times a week.   NON FORMULARY CPAP at bedtime   ondansetron (ZOFRAN) 4 MG tablet Take 1 tablet (4 mg total) by mouth every 8 (eight) hours as needed for nausea or vomiting.   OneTouch Delica Lancets 30G MISC Use to test blood sugars daily as directed. DX: E11.9   OXYGEN Inhale 2 L into the lungs daily as needed (with Cpap at night).   pantoprazole (PROTONIX) 40 MG tablet Take 1 tablet (40 mg total) by mouth daily.   pravastatin (PRAVACHOL) 40 MG tablet TAKE 1 TABLET (40 MG TOTAL) BY MOUTH ON MONDAYS ,  WEDNESDAYS, AND FRIDAYS AS DIRECTED   torsemide (DEMADEX) 20 MG tablet Take 1 tablet (20 mg total) by mouth daily. (Patient taking differently: Take 20 mg by mouth once.)   torsemide (DEMADEX) 20 MG tablet Take 20 mg by mouth daily. Take 1 Tablet Daily   zolpidem (AMBIEN) 5 MG tablet Take 1 tablet (5 mg total) by mouth at bedtime as needed for sleep.   [DISCONTINUED] levothyroxine (SYNTHROID) 50 MCG tablet TAKE 1 TABLET BY MOUTH EVERY DAY   levothyroxine (SYNTHROID) 50 MCG tablet Take 1 tablet (50 mcg total) by mouth daily.   spironolactone (ALDACTONE) 25 MG tablet Take 0.5 tablets (12.5 mg total) by mouth daily.   [DISCONTINUED] predniSONE (DELTASONE) 20 MG tablet 2 po at same time daily for 5 days (Patient not taking: Reported on 04/02/2023)   [DISCONTINUED] zolpidem (AMBIEN) 5 MG tablet Take 1 tablet (5 mg total) by mouth at bedtime as needed for sleep. (Patient taking differently: Take 5 mg by mouth at bedtime.)   No facility-administered encounter medications on file as of 04/02/2023.    ALLERGIES: Allergies  Allergen Reactions   Sulfa Antibiotics Swelling    Mouth swelling   Atorvastatin Other (See Comments)    Muscle aches - tolerating Pravastatin 40 mg MWF   Jardiance [Empagliflozin] Other (See Comments)    FEELS SLUGGISH, TIRED   Lopressor [Metoprolol] Other (See Comments)    Fatigue   Rosuvastatin Other (See Comments)    Muscle aches - tolerating Pravastatin 40 mg MWF   Doxycycline Nausea Only   Tramadol Itching   Temazepam Other (See Comments)    Made insomnia worse     VACCINATION STATUS: Immunization History  Administered Date(s) Administered   Fluad Quad(high Dose 65+) 01/19/2019, 02/01/2020, 01/23/2021, 01/28/2022   Fluad Trivalent(High Dose 65+) 12/26/2022   Influenza, High Dose Seasonal PF 12/28/2015   Influenza,inj,Quad  PF,6+ Mos 01/23/2017   Moderna Covid-19 Fall Seasonal Vaccine 30yrs & older 03/28/2022   Moderna Sars-Covid-2 Vaccination 05/10/2019,  06/07/2019, 01/31/2020   Pneumococcal Conjugate-13 06/17/2017   Pneumococcal Polysaccharide-23 04/25/2019   Respiratory Syncytial Virus Vaccine,Recomb Aduvanted(Arexvy) 03/28/2022   Tdap 05/17/2019   Zoster Recombinant(Shingrix) 11/25/2021, 02/12/2022   Zoster, Live 07/10/2014    Diabetes He presents for his follow-up diabetic visit. He has type 2 diabetes mellitus. Onset time: Was diagnosed at approx age of 59. His disease course has been improving. There are no hypoglycemic associated symptoms. Pertinent negatives for hypoglycemia include no nervousness/anxiousness or tremors. Associated symptoms include blurred vision and foot paresthesias. Pertinent negatives for diabetes include no fatigue, no polydipsia, no polyphagia and no weight loss. There are no hypoglycemic complications. Symptoms are stable. Diabetic complications include heart disease, nephropathy, peripheral neuropathy and PVD. Risk factors for coronary artery disease include diabetes mellitus, dyslipidemia, hypertension, male sex, sedentary lifestyle and tobacco exposure. Current diabetic treatment includes insulin injections. He is compliant with treatment most of the time. His weight is fluctuating minimally. He is following a generally healthy diet. When asked about meal planning, he reported none. He has not had a previous visit with a dietitian. He rarely participates in exercise. His home blood glucose trend is decreasing steadily. His breakfast blood glucose range is generally 110-130 mg/dl. His overall blood glucose range is 140-180 mg/dl. (He presents today, accompanied by his wife, with his CGM showing mostly at target glycemic profile overall.  His most recent A1c on 10/22 was 7.8%, improving from last visit of 7.9%.  He notes he did increase his Tresiba to 18 units as his glucose was trending upwards after recent round of oral steroids to treat COPD exacerbation.   Analysis of his CGM shows TIR 62%, TAR 38%, TBR 0% with a GMI of  7.6%.) An ACE inhibitor/angiotensin II receptor blocker is being taken. He sees a podiatrist.Eye exam is current.  Hyperlipidemia This is a chronic problem. The current episode started more than 1 year ago. The problem is controlled. Recent lipid tests were reviewed and are normal. Exacerbating diseases include chronic renal disease, diabetes and hypothyroidism. Factors aggravating his hyperlipidemia include fatty foods and beta blockers. Current antihyperlipidemic treatment includes statins. The current treatment provides moderate improvement of lipids. There are no compliance problems.  Risk factors for coronary artery disease include diabetes mellitus, dyslipidemia, hypertension, male sex and a sedentary lifestyle.  Hypertension This is a chronic problem. The current episode started more than 1 year ago. The problem has been gradually improving since onset. The problem is controlled. Associated symptoms include blurred vision and palpitations. Agents associated with hypertension include thyroid hormones. Risk factors for coronary artery disease include diabetes mellitus, dyslipidemia, sedentary lifestyle, smoking/tobacco exposure and male gender. Past treatments include angiotensin blockers, diuretics, beta blockers and alpha 1 blockers. The current treatment provides moderate improvement. There are no compliance problems.  Hypertensive end-organ damage includes kidney disease, CAD/MI and PVD. Identifiable causes of hypertension include chronic renal disease, sleep apnea and a thyroid problem.  Thyroid Problem Presents for follow-up visit. Symptoms include palpitations. Patient reports no anxiety, cold intolerance, constipation, depressed mood, diarrhea, dry skin, fatigue, heat intolerance, tremors or weight loss. (Having cardioversion on 7/1 for Afib) The symptoms have been stable. His past medical history is significant for diabetes and hyperlipidemia.     Review of systems  Constitutional: + stable  body weight,  current Body mass index is 25.82 kg/m. , + fatigue, no subjective hyperthermia, no subjective  hypothermia Eyes: no blurry vision, no xerophthalmia ENT: no sore throat, no nodules palpated in throat, no dysphagia/odynophagia, no hoarseness Cardiovascular: no chest pain, no shortness of breath, no palpitations, no leg swelling Respiratory: no cough, no shortness of breath Gastrointestinal: no nausea/vomiting/diarrhea; constipation-stable Musculoskeletal: no muscle/joint aches Skin: no rashes, no hyperemia Neurological: no tremors, + numbness/tingling to BLE, + dizziness with standing- intermittent Psychiatric: no depression, no anxiety  Objective:     BP 126/68 (BP Location: Left Arm, Patient Position: Sitting, Cuff Size: Large)   Pulse (!) 108   Ht 5\' 6"  (1.676 m)   Wt 160 lb (72.6 kg)   BMI 25.82 kg/m   Wt Readings from Last 3 Encounters:  04/02/23 160 lb (72.6 kg)  03/31/23 159 lb 3.2 oz (72.2 kg)  03/13/23 157 lb (71.2 kg)     BP Readings from Last 3 Encounters:  04/02/23 126/68  03/31/23 (!) 142/66  03/13/23 (!) 118/58      Physical Exam- Limited  Constitutional:  Body mass index is 25.82 kg/m. , not in acute distress, normal state of mind Eyes:  EOMI, no exophthalmos Musculoskeletal: no gross deformities, strength intact in all four extremities, no gross restriction of joint movements Skin:  no rashes, no hyperemia, nicotinic discoloration to finger tips bilaterally Neurological: no tremor with outstretched hands    Diabetic Foot Exam - Simple   No data filed     CMP ( most recent) CMP     Component Value Date/Time   NA 135 03/24/2023 1147   NA 137 02/13/2023 1233   K 3.9 03/24/2023 1147   CL 100 03/24/2023 1147   CO2 27 03/24/2023 1147   GLUCOSE 113 (H) 03/24/2023 1147   BUN 16 03/24/2023 1147   BUN 22 02/13/2023 1233   CREATININE 0.84 03/24/2023 1147   CALCIUM 9.1 03/24/2023 1147   PROT 7.1 03/24/2023 1147   PROT 7.4 01/27/2023 1235    ALBUMIN 3.3 (L) 03/24/2023 1147   ALBUMIN 3.8 01/27/2023 1235   AST 13 (L) 03/24/2023 1147   ALT 11 03/24/2023 1147   ALKPHOS 91 03/24/2023 1147   BILITOT 0.6 03/24/2023 1147   BILITOT 0.3 01/27/2023 1235   GFRNONAA >60 03/24/2023 1147   GFRAA 102 05/24/2020 1238     Diabetic Labs (most recent): Lab Results  Component Value Date   HGBA1C 7.8 (H) 01/27/2023   HGBA1C 7.9 (A) 12/29/2022   HGBA1C 8.2 (H) 09/15/2022   MICROALBUR 80 mg/L 09/23/2022   MICROALBUR 30 08/14/2021   MICROALBUR 30 09/24/2020     Lipid Panel ( most recent) Lipid Panel     Component Value Date/Time   CHOL 126 08/21/2020 0530   CHOL 136 04/25/2019 1608   TRIG 89 08/21/2020 0530   HDL 28 (L) 08/21/2020 0530   HDL 28 (L) 04/25/2019 1608   CHOLHDL 4.5 08/21/2020 0530   VLDL 18 08/21/2020 0530   LDLCALC 80 08/21/2020 0530   LDLCALC 78 04/25/2019 1608   LABVLDL 30 04/25/2019 1608      Lab Results  Component Value Date   TSH 1.780 01/27/2023   TSH 2.178 09/19/2022   TSH 1.960 11/25/2021   TSH 2.510 08/23/2021   TSH 2.686 04/18/2021   TSH 3.430 01/22/2021   TSH 5.033 (H) 12/17/2020   TSH 3.410 10/26/2020   TSH 0.628 08/21/2020   TSH 1.542 08/13/2020   FREET4 1.14 01/22/2021   FREET4 1.96 (H) 06/03/2020   FREET4 1.03 02/20/2017  Assessment & Plan:   1) Type 2 diabetes mellitus with diabetic polyneuropathy, without long-term current use of insulin (HCC)  - DREYDAN SOTTO has currently uncontrolled symptomatic type 2 DM since 77 years of age.  He presents today, accompanied by his wife, with his CGM showing mostly at target glycemic profile overall.  His most recent A1c on 10/22 was 7.8%, improving from last visit of 7.9%.  He notes he did increase his Tresiba to 18 units as his glucose was trending upwards after recent round of oral steroids to treat COPD exacerbation.   Analysis of his CGM shows TIR 62%, TAR 38%, TBR 0% with a GMI of 7.6%.  -Recent labs reviewed.  His POCT  UM shows microalbuminuria (likely why he was on Farxiga to begin with).  His kidney function via blood draw recently was normal.  Will avoid putting him back on it due to dehydration issues in the past.  - I had a long discussion with him about the progressive nature of diabetes and the pathology behind its complications. -his diabetes is complicated by CKD stage 2, PAD, and neuropathy and he remains at a high risk for more acute and chronic complications which include CAD, CVA, and retinopathy. These are all discussed in detail with him.  - Nutritional counseling repeated at each appointment due to patients tendency to fall back in to old habits.  - The patient admits there is a room for improvement in their diet and drink choices. -  Suggestion is made for the patient to avoid simple carbohydrates from their diet including Cakes, Sweet Desserts / Pastries, Ice Cream, Soda (diet and regular), Sweet Tea, Candies, Chips, Cookies, Sweet Pastries, Store Bought Juices, Alcohol in Excess of 1-2 drinks a day, Artificial Sweeteners, Coffee Creamer, and "Sugar-free" Products. This will help patient to have stable blood glucose profile and potentially avoid unintended weight gain.   - I encouraged the patient to switch to unprocessed or minimally processed complex starch and increased protein intake (animal or plant source), fruits, and vegetables.   - Patient is advised to stick to a routine mealtimes to eat 3 meals a day and avoid unnecessary snacks (to snack only to correct hypoglycemia).  - he will be scheduled with Norm Salt, RDN, CDE for diabetes education.  - I have approached him with the following individualized plan to manage  his diabetes and patient agrees:   -He is advised to lower his dose of Tresiba back to 16 units SQ nightly now that his prednisone taper is complete.    -he is encouraged to continue monitoring blood glucose twice daily (continue using his CGM), before breakfast and  before bed and to call the clinic if he has readings less than 70 or greater than 200 for 3 tests in a row.    - Specific targets for  A1c;  LDL, HDL,  and Triglycerides were discussed with the patient.  2) Blood Pressure /Hypertension:  his blood pressure is controlled to target.  Will defer any changes to cardiology or PCP.  3) Lipids/Hyperlipidemia:    Review of his recent lipid panel from 08/21/20 showed controlled  LDL at 80.  he  is advised to continue Pravachol 40 mg daily at bedtime.  Side effects and precautions discussed with him.  4)  Weight/Diet:  his Body mass index is 25.82 kg/m.  -  he is is not a candidate for major weight loss. I discussed with him the fact that loss of 5 - 10%  of his  current body weight will have the most impact on his diabetes management.  Exercise, and detailed carbohydrates information provided  -  detailed on discharge instructions.  5) Hypothyroidism: There are no recent TFTs to review.  He is advised to continue Levothyroxine 50 mcg po daily before breakfast.  Will recheck prior to next visit.   - The correct intake of thyroid hormone (Levothyroxine, Synthroid), is on empty stomach first thing in the morning, with water, separated by at least 30 minutes from breakfast and other medications,  and separated by more than 4 hours from calcium, iron, multivitamins, acid reflux medications (PPIs).  - This medication is a life-long medication and will be needed to correct thyroid hormone imbalances for the rest of your life.  The dose may change from time to time, based on thyroid blood work.  - It is extremely important to be consistent taking this medication, near the same time each morning.  -AVOID TAKING PRODUCTS CONTAINING BIOTIN (commonly found in Hair, Skin, Nails vitamins) AS IT INTERFERES WITH THE VALIDITY OF THYROID FUNCTION BLOOD TESTS.  6) Chronic Care/Health Maintenance: -he is on ACEI/ARB and Statin medications and is encouraged to initiate  and continue to follow up with Ophthalmology, Dentist, Podiatrist at least yearly or according to recommendations, and advised to QUIT SMOKING. I have recommended yearly flu vaccine and pneumonia vaccine at least every 5 years; moderate intensity exercise for up to 150 minutes weekly; and sleep for at least 7 hours a day.  - he is advised to maintain close follow up with Junie Spencer, FNP for primary care needs, as well as his other providers for optimal and coordinated care.     I spent  38  minutes in the care of the patient today including review of labs from CMP, Lipids, Thyroid Function, Hematology (current and previous including abstractions from other facilities); face-to-face time discussing  his blood glucose readings/logs, discussing hypoglycemia and hyperglycemia episodes and symptoms, medications doses, his options of short and long term treatment based on the latest standards of care / guidelines;  discussion about incorporating lifestyle medicine;  and documenting the encounter. Risk reduction counseling performed per USPSTF guidelines to reduce obesity and cardiovascular risk factors.     Please refer to Patient Instructions for Blood Glucose Monitoring and Insulin/Medications Dosing Guide"  in media tab for additional information. Please  also refer to " Patient Self Inventory" in the Media  tab for reviewed elements of pertinent patient history.  [name] participated in the discussions, expressed understanding, and voiced agreement with the above plans.  All questions were answered to his satisfaction. he is encouraged to contact clinic should he have any questions or concerns prior to his return visit.   Follow up plan: - Return in about 4 months (around 08/01/2023) for Diabetes F/U with A1c in office, Thyroid follow up, Previsit labs, Bring meter and logs.  Ronny Bacon, [facility] [address] [place], [place]  [address] Phone: [phone] Fax: [phone]  04/02/2023, 12:05 PM

## 2023-04-03 ENCOUNTER — Encounter: Payer: Self-pay | Admitting: Adult Health

## 2023-04-03 ENCOUNTER — Ambulatory Visit: Payer: Medicare Other | Attending: Adult Health | Admitting: Adult Health

## 2023-04-03 VITALS — BP 116/60 | HR 110 | Ht 68.0 in | Wt 159.8 lb

## 2023-04-03 DIAGNOSIS — I5032 Chronic diastolic (congestive) heart failure: Secondary | ICD-10-CM | POA: Diagnosis not present

## 2023-04-03 DIAGNOSIS — I739 Peripheral vascular disease, unspecified: Secondary | ICD-10-CM

## 2023-04-03 DIAGNOSIS — I4719 Other supraventricular tachycardia: Secondary | ICD-10-CM | POA: Diagnosis not present

## 2023-04-03 DIAGNOSIS — I4892 Unspecified atrial flutter: Secondary | ICD-10-CM

## 2023-04-03 DIAGNOSIS — J449 Chronic obstructive pulmonary disease, unspecified: Secondary | ICD-10-CM

## 2023-04-03 MED ORDER — PROPRANOLOL HCL 10 MG PO TABS: 10.0000 mg | ORAL_TABLET | Freq: Three times a day (TID) | ORAL | 6 refills | Status: DC | PRN

## 2023-04-03 NOTE — Patient Instructions (Signed)
Medication Instructions:  STOP BISOPROLOL START TAKING PROPRANOLOL 10 MG THREE TIMES DAILY AS NEEDED FOR RAPID HEART RATE.    Lab Work: NONE    Testing/Procedures: NONE    Follow-Up: At [facility], you and your health needs are our priority.  As part of our continuing mission to provide you with exceptional heart care, we have created designated Provider Care Teams.  These Care Teams include your primary Cardiologist (physician) and Advanced Practice Providers (APPs -  Physician Assistants and Nurse Practitioners) who all work together to provide you with the care you need, when you need it.       Your next appointment:   6 month(s)   Provider:   Reatha Harps, MD

## 2023-04-05 ENCOUNTER — Other Ambulatory Visit: Payer: Self-pay | Admitting: Family

## 2023-04-05 DIAGNOSIS — K219 Gastro-esophageal reflux disease without esophagitis: Secondary | ICD-10-CM

## 2023-04-07 ENCOUNTER — Other Ambulatory Visit: Payer: Self-pay | Admitting: Family

## 2023-04-07 DIAGNOSIS — E1169 Type 2 diabetes mellitus with other specified complication: Secondary | ICD-10-CM

## 2023-04-09 ENCOUNTER — Encounter: Payer: Self-pay | Admitting: Student

## 2023-04-09 ENCOUNTER — Ambulatory Visit: Payer: Medicare Other | Attending: Student | Admitting: Student

## 2023-04-09 ENCOUNTER — Ambulatory Visit: Payer: Medicare Other | Admitting: Family

## 2023-04-09 VITALS — BP 126/60 | HR 118 | Ht 66.5 in | Wt 158.0 lb

## 2023-04-09 DIAGNOSIS — E1142 Type 2 diabetes mellitus with diabetic polyneuropathy: Secondary | ICD-10-CM | POA: Diagnosis not present

## 2023-04-09 DIAGNOSIS — R000 Tachycardia, unspecified: Secondary | ICD-10-CM | POA: Insufficient documentation

## 2023-04-09 DIAGNOSIS — D62 Acute posthemorrhagic anemia: Secondary | ICD-10-CM | POA: Diagnosis not present

## 2023-04-09 DIAGNOSIS — E1159 Type 2 diabetes mellitus with other circulatory complications: Secondary | ICD-10-CM | POA: Diagnosis not present

## 2023-04-09 DIAGNOSIS — I5032 Chronic diastolic (congestive) heart failure: Secondary | ICD-10-CM | POA: Insufficient documentation

## 2023-04-09 DIAGNOSIS — Z48812 Encounter for surgical aftercare following surgery on the circulatory system: Secondary | ICD-10-CM | POA: Diagnosis not present

## 2023-04-09 DIAGNOSIS — I10 Essential (primary) hypertension: Secondary | ICD-10-CM | POA: Diagnosis not present

## 2023-04-09 DIAGNOSIS — I5033 Acute on chronic diastolic (congestive) heart failure: Secondary | ICD-10-CM | POA: Diagnosis not present

## 2023-04-09 DIAGNOSIS — I152 Hypertension secondary to endocrine disorders: Secondary | ICD-10-CM | POA: Diagnosis not present

## 2023-04-09 MED ORDER — DILTIAZEM HCL ER COATED BEADS 120 MG PO CP24: ORAL_CAPSULE | ORAL | 3 refills | Status: DC

## 2023-04-09 NOTE — Patient Instructions (Addendum)
 Medication Instructions:  Your physician has recommended you make the following change in your medication:   ** Diltiazem  120mg  - 1 capsule daily as needed for fast heart rate if Propranolol  does not work.   *If you need a refill on your cardiac medications before your next appointment, please call your pharmacy*   Lab Work: None ordered.  If you have labs (blood work) drawn today and your tests are completely normal, you will receive your results only by: MyChart Message (if you have MyChart) OR A paper copy in the mail If you have any lab test that is abnormal or we need to change your treatment, we will call you to review the results.   Testing/Procedures: None ordered.    Follow-Up: At [facility], you and your health needs are our priority.  As part of our continuing mission to provide you with exceptional heart care, we have created designated Provider Care Teams.  These Care Teams include your primary Cardiologist (physician) and Advanced Practice Providers (APPs -  Physician Assistants and Nurse Practitioners) who all work together to provide you with the care you need, when you need it.  We recommend signing up for the patient portal called MyChart.  Sign up information is provided on this After Visit Summary.  MyChart is used to connect with patients for Virtual Visits (Telemedicine).  Patients are able to view lab/test results, encounter notes, upcoming appointments, etc.  Non-urgent messages can be sent to your provider as well.   To learn more about what you can do with MyChart, go to [email or web address].    Your next appointment:   Keep appointment as scheduled with Dr Cindie

## 2023-04-09 NOTE — Progress Notes (Signed)
   Electrophysiology Office Note:   Date:  04/09/2023  ID:  DAYAN DESA, DOB [birth year], MRN [id number]  Primary Cardiologist: Darryle ONEIDA Decent, MD Electrophysiologist: OLE ONEIDA HOLTS, MD      History of Present Illness:   John [name] is a 78 y.o. male with h/o typical atrial flutter, chronic anemia, chronic diastolic CHF, and HTN seen today for routine electrophysiology followup.   Since last being seen in our clinic the patient reports doing about the same. Chronic fatigue. Bad COPD with frequent wheezing and MDI + rescue multiple times daily, but not to the point of oxygen  dependence.  Uses O2 prn and at night with CPAP. Recently on prednisone .  By loop HRs 90-120s.  he denies chest pain, palpitations, dyspnea, PND, orthopnea, nausea, vomiting, dizziness, syncope, edema, weight gain, or early satiety.   Review of systems complete and found to be negative unless listed in HPI.     Studies Reviewed:    EKG is ordered today. Personal review as below.  EKG Interpretation Date/Time:  Thursday April 09 2023 10:53:49 EST Ventricular Rate:  118 PR Interval:    QRS Duration:  112 QT Interval:  414 QTC Calculation: 580 R Axis:   38  Text Interpretation: Undetermined rhythm Low voltage QRS Right bundle branch block Possible Lateral infarct , age undetermined Inferior infarct (cited on or before 13-Jan-2022) Confirmed by Lesia Sharper [phone]) on 04/09/2023 11:02:38 AM    Arrhythmia History  S/p AFL ablation 01/2022  Device History: [employer] loop recorder implanted 04/2022 for arrhythmia monitoring s/p AFL ablation       Physical Exam:   VS:  BP 126/60   Pulse (!) 118   Ht 5' 6.5 (1.689 m)   Wt 158 lb (71.7 kg)   SpO2 95%   BMI 25.12 kg/m    Wt Readings from Last 3 Encounters:  04/09/23 158 lb (71.7 kg)  04/03/23 159 lb 12.8 oz (72.5 kg)  04/02/23 160 lb (72.6 kg)     GEN: Well nourished, well developed in no acute distress NECK: No JVD; No carotid  bruits CARDIAC: Tachy but regular rate and rhythm, no murmurs, rubs, gallops RESPIRATORY:  Diminished with expiratory wheezing throughout.  ABDOMEN: Soft, non-tender, non-distended EXTREMITIES:  No edema; No deformity   ILR Interrogation- reviewed in detail today,  See PACEART report  ASSESSMENT AND PLAN:    H/o AFL s/p Environmental Manager Loop recorder Normal device function See Pace Art report No changes today  Sinus tachycardia vs AT? Will try propranolol  as ordered (picked up but didn't want to start over holiday) Will send a rx for Diltiazem  120 mg daily to try if propranolol  does not help or is not tolerated.  Chronic anemia Updated labwork pending for other underlying causes of his tachycardia.   Hgb stable 12/17 and denies active bleeding.   Chronic diastolic CHF NYHA III symptoms confounded by his COPD Volume status stable on exam.   HTN Stable on current regimen   Follow up with Dr. Holts in 4 weeks, sooner with EPAPP if symptoms/rates do not improve  Signed, Sharper Prentice Lesia, PA-C

## 2023-04-10 ENCOUNTER — Inpatient Hospital Stay: Payer: Medicare Other | Attending: Hematology

## 2023-04-10 VITALS — BP 120/63 | HR 92 | Temp 96.7°F | Resp 20

## 2023-04-10 DIAGNOSIS — D500 Iron deficiency anemia secondary to blood loss (chronic): Secondary | ICD-10-CM

## 2023-04-10 DIAGNOSIS — D509 Iron deficiency anemia, unspecified: Secondary | ICD-10-CM | POA: Insufficient documentation

## 2023-04-10 MED ORDER — HEPARIN SOD (PORK) LOCK FLUSH 100 UNIT/ML IV SOLN
500.0000 [IU] | Freq: Once | INTRAVENOUS | Status: AC | PRN
  Administered 2023-04-10: 500 [IU]

## 2023-04-10 MED ORDER — SODIUM CHLORIDE 0.9% FLUSH
10.0000 mL | Freq: Once | INTRAVENOUS | Status: AC | PRN
  Administered 2023-04-10: 10 mL

## 2023-04-10 MED ORDER — SODIUM CHLORIDE 0.9 % IV SOLN: INTRAVENOUS | Status: DC

## 2023-04-10 MED ORDER — SODIUM CHLORIDE 0.9 % IV SOLN
1000.0000 mg | Freq: Once | INTRAVENOUS | Status: AC
  Administered 2023-04-10: 1000 mg via INTRAVENOUS
  Filled 2023-04-10: qty 1000

## 2023-04-10 NOTE — Progress Notes (Signed)
 Patient is here today for his initial dose of monoferric .  Per providers last office note, no pre-meds are needed.  Pharmacy confirmed.  Patient reports overall he is doing well he just has no energy and is fatigued.  Iron  is given per orders, see MAR for administration information.  At this time he is comfortable and wife is at chairside.  Report given to Vernell, primary RN.

## 2023-04-10 NOTE — Progress Notes (Signed)
 Patient tolerated iron infusion with no complaints voiced.  Peripheral IV site clean and dry with good blood return noted before and after infusion.  Band aid applied.  VSS with discharge and left in satisfactory condition with no s/s of distress noted.

## 2023-04-10 NOTE — Patient Instructions (Signed)
 [facility] [place] - A DEPT OF [facility]  Discharge Instructions: Thank you for choosing [facility] to provide your oncology and hematology care.  If you have a lab appointment with the Cancer Center - please note that after April 8th, 2024, all labs will be drawn in the cancer center.  You do not have to check in or register with the main entrance as you have in the past but will complete your check-in in the cancer center.  Wear comfortable clothing and clothing appropriate for easy access to any Portacath or PICC line.   We strive to give you quality time with your provider. You may need to reschedule your appointment if you arrive late (15 or more minutes).  Arriving late affects you and other patients whose appointments are after yours.  Also, if you miss three or more appointments without notifying the office, you may be dismissed from the clinic at the provider's discretion.      For prescription refill requests, have your pharmacy contact our office and allow 72 hours for refills to be completed.    Today you received the following:  Monoferric iron infusion      To help prevent nausea and vomiting after your treatment, we encourage you to take your nausea medication as directed.  BELOW ARE SYMPTOMS THAT SHOULD BE REPORTED IMMEDIATELY: *FEVER GREATER THAN 100.4 F (38 C) OR HIGHER *CHILLS OR SWEATING *NAUSEA AND VOMITING THAT IS NOT CONTROLLED WITH YOUR NAUSEA MEDICATION *UNUSUAL SHORTNESS OF BREATH *UNUSUAL BRUISING OR BLEEDING *URINARY PROBLEMS (pain or burning when urinating, or frequent urination) *BOWEL PROBLEMS (unusual diarrhea, constipation, pain near the anus) TENDERNESS IN MOUTH AND THROAT WITH OR WITHOUT PRESENCE OF ULCERS (sore throat, sores in mouth, or a toothache) UNUSUAL RASH, SWELLING OR PAIN  UNUSUAL VAGINAL DISCHARGE OR ITCHING   Items with * indicate a potential emergency and should be followed up as soon as possible or  go to the Emergency Department if any problems should occur.  Please show the CHEMOTHERAPY ALERT CARD or IMMUNOTHERAPY ALERT CARD at check-in to the Emergency Department and triage nurse.  Should you have questions after your visit or need to cancel or reschedule your appointment, please contact [facility] [place] - A DEPT OF Eligh[facility] [phone]  and follow the prompts.  Office hours are 8:00 a.m. to 4:30 p.m. Monday - Friday. Please note that voicemails left after 4:00 p.m. may not be returned until the following business day.  We are closed weekends and major holidays. You have access to a nurse at all times for urgent questions. Please call the main number to the clinic [phone] and follow the prompts.  For any non-urgent questions, you may also contact your provider using MyChart. We now offer e-Visits for anyone 61 and older to request care online for non-urgent symptoms. For details visit [email or web address].   Also download the MyChart app! Go to the app store, search "MyChart", open the app, select [place], and log in with your MyChart username and password.

## 2023-04-16 ENCOUNTER — Ambulatory Visit (INDEPENDENT_AMBULATORY_CARE_PROVIDER_SITE_OTHER): Payer: Medicare Other

## 2023-04-16 DIAGNOSIS — R000 Tachycardia, unspecified: Secondary | ICD-10-CM | POA: Diagnosis not present

## 2023-04-16 DIAGNOSIS — I152 Hypertension secondary to endocrine disorders: Secondary | ICD-10-CM | POA: Diagnosis not present

## 2023-04-16 DIAGNOSIS — E1159 Type 2 diabetes mellitus with other circulatory complications: Secondary | ICD-10-CM | POA: Diagnosis not present

## 2023-04-16 DIAGNOSIS — D62 Acute posthemorrhagic anemia: Secondary | ICD-10-CM | POA: Diagnosis not present

## 2023-04-16 DIAGNOSIS — I5033 Acute on chronic diastolic (congestive) heart failure: Secondary | ICD-10-CM | POA: Diagnosis not present

## 2023-04-16 DIAGNOSIS — Z48812 Encounter for surgical aftercare following surgery on the circulatory system: Secondary | ICD-10-CM | POA: Diagnosis not present

## 2023-04-16 DIAGNOSIS — E1142 Type 2 diabetes mellitus with diabetic polyneuropathy: Secondary | ICD-10-CM | POA: Diagnosis not present

## 2023-04-20 ENCOUNTER — Ambulatory Visit: Payer: Medicare Other

## 2023-04-20 DIAGNOSIS — I4892 Unspecified atrial flutter: Secondary | ICD-10-CM

## 2023-04-20 LAB — CUP PACEART REMOTE DEVICE CHECK
Date Time Interrogation Session: 20250113001700
Pulse Gen Serial Number: 104324

## 2023-04-22 DIAGNOSIS — I152 Hypertension secondary to endocrine disorders: Secondary | ICD-10-CM | POA: Diagnosis not present

## 2023-04-22 DIAGNOSIS — Z48812 Encounter for surgical aftercare following surgery on the circulatory system: Secondary | ICD-10-CM | POA: Diagnosis not present

## 2023-04-22 DIAGNOSIS — E1159 Type 2 diabetes mellitus with other circulatory complications: Secondary | ICD-10-CM | POA: Diagnosis not present

## 2023-04-22 DIAGNOSIS — I5033 Acute on chronic diastolic (congestive) heart failure: Secondary | ICD-10-CM | POA: Diagnosis not present

## 2023-04-22 DIAGNOSIS — E1142 Type 2 diabetes mellitus with diabetic polyneuropathy: Secondary | ICD-10-CM | POA: Diagnosis not present

## 2023-04-22 DIAGNOSIS — D62 Acute posthemorrhagic anemia: Secondary | ICD-10-CM | POA: Diagnosis not present

## 2023-04-24 NOTE — Progress Notes (Unsigned)
Cardiology Office Note:  .   Date:  04/27/2023  ID:  John [name], DOB 06/19/1945, MRN [id number] PCP: Junie Spencer, FNP   HeartCare Providers Cardiologist:  Reatha Harps, MD   }   History of Present Illness: John [name] is a 78 y.o. male with history of atrial flutter, status post ablation on 02/13/2022 with a chads Vascor 5, hyperlipidemia, HFpEF, PAD, right femoral endarterectomy on 08/20/2020 and right common iliac artery stent on 08/10/2020, non-small cell lung CA status post chemoradiation in 2018, COPD, and history of GIB.  Last seen in the office on 04/03/2023, it was noted that he could not take bisoprolol as this caused confusion and delirium.  When discontinued the medication he returned to normal mentation.  He was found to be tachycardic.  He was due to follow-up with A-fib clinic within 3 days they were going to evaluate him further may need to adjust medications.  I started him on propranolol 10 mg 3 times a day as needed until being seen.  When seen by A-fib clinic he had not yet started propranolol, though he was encouraged to do so by Otilio Saber, PA.  Also a prescription for diltiazem 120 mg daily to try propranolol did not help but was not tolerated.  He was to follow-up for ongoing assessment of response to medication.  Today he states she has been taking propranolol as directed 1 in the morning and 1 in the afternoon.  He has not been taking 3 times a day as he did not feel his heart rate required it.  He is feeling some better with heart rate much better controlled although not optimal for resting heart rate in the 70s and 80s.  He states he is getting up and walking around more at home and has been easier to do so.  COPD is causing some issues with breathing and activity level.  He is not having any side effects from the propranolol at the lowest dose.  ROS: As above otherwise negative  Studies Reviewed: .      Echocardiogram 09/15/2021 1. Aortic  valve calcified with restricted motion but no AS by doppler.   2. Left ventricular ejection fraction, by estimation, is 55 to 60%. The  left ventricle has normal function. The left ventricle has no regional  wall motion abnormalities. Left ventricular diastolic parameters are  indeterminate.   3. Right ventricular systolic function is normal. The right ventricular  size is normal. There is mildly elevated pulmonary artery systolic  pressure.   4. Left atrial size was mildly dilated.   5. The mitral valve is normal in structure. No evidence of mitral valve  regurgitation. No evidence of mitral stenosis. Moderate mitral annular  calcification.   6. The aortic valve is calcified. Aortic valve regurgitation is trivial.  Aortic valve sclerosis/calcification is present, without any evidence of  aortic stenosis.   7. The inferior vena cava is dilated in size with <50% respiratory  variability, suggesting right atrial pressure of 15 mmHg.   Physical Exam:   VS:  BP 120/62 (BP Location: Left Arm, Patient Position: Sitting, Cuff Size: Normal)   Pulse 96   Ht 5\' 6"  (1.676 m)   Wt 152 lb 12.8 oz (69.3 kg)   SpO2 93%   BMI 24.66 kg/m    Wt Readings from Last 3 Encounters:  04/27/23 152 lb 12.8 oz (69.3 kg)  04/09/23 158 lb (71.7 kg)  04/03/23 159 lb 12.8  oz (72.5 kg)    GEN: Well nourished, well developed in no acute distress NECK: No JVD; No carotid bruits CARDIAC: RRR, no murmurs, rubs, gallops RESPIRATORY: Upper airway crackles without wheezes, lung bases are clear. ABDOMEN: Soft, non-tender, non-distended EXTREMITIES:  No edema; No deformity   ASSESSMENT AND PLAN: .   Tachycardia: Heart rate is better controlled down by 10 to 15 bpm with addition of propranolol 10 mg 3 times a day.  He is unable to tolerate bisoprolol or Lopressor.  He is tolerating the propranolol.  He denies any wheezes or side effects from this dose.  Can always titrate up to 20 mg 3 times a day if heart rate is not  well-controlled but it is better today.  If the patient is not going to tolerate propranolol at higher doses he will start on diltiazem 120 mg daily.  Can possibly consider doing both medications however I do not want to drop his blood pressure too low.  2.  Hypertension: Blood pressure is well-controlled today.  Would prefer not to start him on diltiazem if I can help but I do not want to drop his pressure too low.    3.  COPD: He is using his inhalers a lot especially during the winter months.  This may be contributing to elevated heart rate.  Right now he states that he is only using the Trelegy inhaler.  Is not had to use the rescue inhaler as often lately.  Will see how he does.  4.  ILR in situ: Followed by Dr. Elberta Fortis.  He noted that the patient's heart rate was not well-controlled prior to me starting him on the propranolol.  He is due to see him on April 30, 2023 with adjustment of the medications at his discretion.      Signed, Bettey Mare. Liborio Nixon, ANP, AACC

## 2023-04-27 ENCOUNTER — Encounter: Payer: Self-pay | Admitting: Adult Health

## 2023-04-27 ENCOUNTER — Ambulatory Visit: Payer: Medicare Other | Attending: Adult Health | Admitting: Adult Health

## 2023-04-27 VITALS — BP 120/62 | HR 96 | Ht 66.0 in | Wt 152.8 lb

## 2023-04-27 DIAGNOSIS — J449 Chronic obstructive pulmonary disease, unspecified: Secondary | ICD-10-CM | POA: Insufficient documentation

## 2023-04-27 DIAGNOSIS — I10 Essential (primary) hypertension: Secondary | ICD-10-CM | POA: Diagnosis not present

## 2023-04-27 DIAGNOSIS — R000 Tachycardia, unspecified: Secondary | ICD-10-CM | POA: Diagnosis not present

## 2023-04-27 MED ORDER — PROPRANOLOL HCL 10 MG PO TABS: 10.0000 mg | ORAL_TABLET | Freq: Three times a day (TID) | ORAL | 6 refills | Status: DC | PRN

## 2023-04-27 NOTE — Patient Instructions (Signed)
 Medication Instructions:  No Changes *If you need a refill on your cardiac medications before your next appointment, please call your pharmacy*   Lab Work: No labs If you have labs (blood work) drawn today and your tests are completely normal, you will receive your results only by: MyChart Message (if you have MyChart) OR A paper copy in the mail If you have any lab test that is abnormal or we need to change your treatment, we will call you to review the results.   Testing/Procedures: No Testing   Follow-Up: At [facility], you and your health needs are our priority.  As part of our continuing mission to provide you with exceptional heart care, we have created designated Provider Care Teams.  These Care Teams include your primary Cardiologist (physician) and Advanced Practice Providers (APPs -  Physician Assistants and Nurse Practitioners) who all work together to provide you with the care you need, when you need it.  We recommend signing up for the patient portal called "MyChart".  Sign up information is provided on this After Visit Summary.  MyChart is used to connect with patients for Virtual Visits (Telemedicine).  Patients are able to view lab/test results, encounter notes, upcoming appointments, etc.  Non-urgent messages can be sent to your provider as well.   To learn more about what you can do with MyChart, go to [email or web address].    Your next appointment:   6 month(s)  Provider:   Lenna Gilford. Flora Lipps, MD

## 2023-04-28 ENCOUNTER — Ambulatory Visit: Payer: Medicare Other | Admitting: Family

## 2023-04-29 DIAGNOSIS — E1142 Type 2 diabetes mellitus with diabetic polyneuropathy: Secondary | ICD-10-CM | POA: Diagnosis not present

## 2023-04-29 DIAGNOSIS — Z48812 Encounter for surgical aftercare following surgery on the circulatory system: Secondary | ICD-10-CM | POA: Diagnosis not present

## 2023-04-29 DIAGNOSIS — E1159 Type 2 diabetes mellitus with other circulatory complications: Secondary | ICD-10-CM | POA: Diagnosis not present

## 2023-04-29 DIAGNOSIS — I5033 Acute on chronic diastolic (congestive) heart failure: Secondary | ICD-10-CM | POA: Diagnosis not present

## 2023-04-29 DIAGNOSIS — D62 Acute posthemorrhagic anemia: Secondary | ICD-10-CM | POA: Diagnosis not present

## 2023-04-29 DIAGNOSIS — I152 Hypertension secondary to endocrine disorders: Secondary | ICD-10-CM | POA: Diagnosis not present

## 2023-04-30 ENCOUNTER — Encounter: Payer: Self-pay | Admitting: Family

## 2023-04-30 ENCOUNTER — Ambulatory Visit: Payer: Medicare Other | Admitting: Family

## 2023-04-30 VITALS — BP 134/76 | HR 96 | Temp 97.4°F | Ht 66.0 in | Wt 152.4 lb

## 2023-04-30 DIAGNOSIS — K5900 Constipation, unspecified: Secondary | ICD-10-CM

## 2023-04-30 DIAGNOSIS — F17210 Nicotine dependence, cigarettes, uncomplicated: Secondary | ICD-10-CM

## 2023-04-30 DIAGNOSIS — C3491 Malignant neoplasm of unspecified part of right bronchus or lung: Secondary | ICD-10-CM

## 2023-04-30 DIAGNOSIS — E1159 Type 2 diabetes mellitus with other circulatory complications: Secondary | ICD-10-CM

## 2023-04-30 DIAGNOSIS — E1169 Type 2 diabetes mellitus with other specified complication: Secondary | ICD-10-CM

## 2023-04-30 DIAGNOSIS — N138 Other obstructive and reflux uropathy: Secondary | ICD-10-CM | POA: Diagnosis not present

## 2023-04-30 DIAGNOSIS — N401 Enlarged prostate with lower urinary tract symptoms: Secondary | ICD-10-CM

## 2023-04-30 DIAGNOSIS — E1142 Type 2 diabetes mellitus with diabetic polyneuropathy: Secondary | ICD-10-CM | POA: Diagnosis not present

## 2023-04-30 DIAGNOSIS — M150 Primary generalized (osteo)arthritis: Secondary | ICD-10-CM

## 2023-04-30 DIAGNOSIS — F5101 Primary insomnia: Secondary | ICD-10-CM

## 2023-04-30 DIAGNOSIS — I5032 Chronic diastolic (congestive) heart failure: Secondary | ICD-10-CM | POA: Diagnosis not present

## 2023-04-30 DIAGNOSIS — I152 Hypertension secondary to endocrine disorders: Secondary | ICD-10-CM | POA: Diagnosis not present

## 2023-04-30 DIAGNOSIS — E785 Hyperlipidemia, unspecified: Secondary | ICD-10-CM | POA: Diagnosis not present

## 2023-04-30 DIAGNOSIS — J449 Chronic obstructive pulmonary disease, unspecified: Secondary | ICD-10-CM

## 2023-04-30 DIAGNOSIS — R110 Nausea: Secondary | ICD-10-CM | POA: Diagnosis not present

## 2023-04-30 DIAGNOSIS — E039 Hypothyroidism, unspecified: Secondary | ICD-10-CM

## 2023-04-30 DIAGNOSIS — G4733 Obstructive sleep apnea (adult) (pediatric): Secondary | ICD-10-CM

## 2023-04-30 DIAGNOSIS — K219 Gastro-esophageal reflux disease without esophagitis: Secondary | ICD-10-CM

## 2023-04-30 LAB — BAYER DCA HB A1C WAIVED: HB A1C (BAYER DCA - WAIVED): 5.8 % — ABNORMAL HIGH (ref 4.8–5.6)

## 2023-04-30 MED ORDER — LINACLOTIDE 72 MCG PO CAPS: 72.0000 ug | ORAL_CAPSULE | Freq: Every day | ORAL | 1 refills | Status: DC

## 2023-04-30 MED ORDER — GABAPENTIN 300 MG PO CAPS: 300.0000 mg | ORAL_CAPSULE | Freq: Three times a day (TID) | ORAL | 2 refills | Status: DC

## 2023-04-30 MED ORDER — PRAVASTATIN SODIUM 40 MG PO TABS: ORAL_TABLET | ORAL | 4 refills | Status: DC

## 2023-04-30 MED ORDER — ONDANSETRON HCL 4 MG PO TABS
4.0000 mg | ORAL_TABLET | Freq: Three times a day (TID) | ORAL | 1 refills | Status: DC | PRN
Start: 2023-04-30 — End: 2023-10-20

## 2023-04-30 MED ORDER — PANTOPRAZOLE SODIUM 40 MG PO TBEC: 40.0000 mg | DELAYED_RELEASE_TABLET | Freq: Every day | ORAL | 0 refills | Status: DC

## 2023-04-30 MED ORDER — ZOLPIDEM TARTRATE 5 MG PO TABS
5.0000 mg | ORAL_TABLET | Freq: Every evening | ORAL | 2 refills | Status: DC | PRN
Start: 2023-04-30 — End: 2023-07-29

## 2023-04-30 NOTE — Patient Instructions (Signed)
Osteoarthritis  Osteoarthritis is a type of arthritis. It refers to joint pain or joint disease. Osteoarthritis affects tissue that covers the ends of bones in joints (cartilage). Cartilage acts as a cushion between the bones and helps them move smoothly. Osteoarthritis occurs when cartilage in the joints gets worn down. Osteoarthritis is sometimes called "wear and tear" arthritis. Osteoarthritis is the most common form of arthritis. It often occurs in older people. It is a condition that gets worse over time. The joints most often affected by this condition are in the fingers, toes, hips, knees, and spine, including the neck and lower back. What are the causes? This condition is caused by the wearing down of cartilage that covers the ends of bones. What increases the risk? The following factors may make you more likely to develop this condition: Being age 50 or older. Obesity. Overuse of joints. Past injury of a joint. Past surgery on a joint. Family history of osteoarthritis. What are the signs or symptoms? The main symptoms of this condition are pain, swelling, and stiffness in the joint. Other symptoms may include: An enlarged joint. More pain and further damage caused by small pieces of bone or cartilage that break off and float inside of the joint. Small deposits of bone (osteophytes) that grow on the edges of the joint. A grating or scraping feeling inside the joint when you move it. Popping or creaking sounds when you move. Difficulty walking or exercising. An inability to grip items, twist your hand, or control the movements of your hands and fingers. How is this diagnosed? This condition may be diagnosed based on: Your medical history. A physical exam. Your symptoms. X-rays of the affected joints. Blood tests to rule out other types of arthritis. How is this treated? There is no cure for this condition, but treatment can help control pain and improve joint function. Treatment  may include a combination of therapies, such as: Pain relief techniques, such as: Applying heat and cold to the joint. Massage. A form of talk therapy called cognitive behavioral therapy (CBT). This therapy helps you set goals and follow up on the changes that you make. Medicines for pain and inflammation. The medicines can be taken by mouth or applied to the skin. They include: NSAIDs, such as ibuprofen. Prescription medicines. Strong anti-inflammatory medicines (corticosteroids). Certain nutritional supplements. A prescribed exercise program. You may work with a physical therapist. Assistive devices, such as a brace, wrap, splint, specialized glove, or cane. A weight control plan. Surgery, such as: An osteotomy. This is done to reposition the bones and relieve pain or to remove loose pieces of bone and cartilage. Joint replacement surgery. You may need this surgery if you have advanced osteoarthritis. Follow these instructions at home: Activity Rest your affected joints as told by your health care provider. Exercise as told by your provider. The provider may recommend specific types of exercise, such as: Strengthening exercises. These are done to strengthen the muscles that support joints affected by arthritis. Aerobic activities. These are exercises, such as brisk walking or water aerobics, that increase your heart rate. Range-of-motion activities. These help your joints move more easily. Balance and agility exercises. Managing pain, stiffness, and swelling     If told, apply heat to the affected area as often as told by your provider. Use the heat source that your provider recommends, such as a moist heat pack or a heating pad. If you have a removable assistive device, remove it as told by your provider. Place a   towel between your skin and the heat source. If your provider tells you to keep the assistive device on while you apply heat, place a towel between the assistive device and  the heat source. Leave the heat on for 20-30 minutes. If told, put ice on the affected area. If you have a removable assistive device, remove it as told by your provider. Put ice in a plastic bag. Place a towel between your skin and the bag. If your provider tells you to keep the assistive device on during icing, place a towel between the assistive device and the bag. Leave the ice on for 20 minutes, 2-3 times a day. If your skin turns bright red, remove the ice or heat right away to prevent skin damage. The risk of damage is higher if you cannot feel pain, heat, or cold. Move your fingers or toes often to reduce stiffness and swelling. Raise (elevate) the affected area above the level of your heart while you are sitting or lying down. General instructions Take over-the-counter and prescription medicines only as told by your provider. Maintain a healthy weight. Follow instructions from your provider for weight control. Do not use any products that contain nicotine or tobacco. These products include cigarettes, chewing tobacco, and vaping devices, such as e-cigarettes. If you need help quitting, ask your provider. Use assistive devices as told by your provider. Where to find more information National Institute of Arthritis and Musculoskeletal and Skin Diseases: niams.nih.gov National Institute on Aging: nia.nih.gov American College of Rheumatology: rheumatology.org Contact a health care provider if: You have redness, swelling, or a feeling of warmth in a joint that gets worse. You have a fever along with joint or muscle aches. You develop a rash. You have trouble doing your normal activities. You have pain that gets worse and is not relieved by pain medicine. This information is not intended to replace advice given to you by your health care provider. Make sure you discuss any questions you have with your health care provider. Document Revised: 11/21/2021 Document Reviewed:  11/21/2021 Elsevier Patient Education  2024 Elsevier Inc.  

## 2023-04-30 NOTE — Progress Notes (Signed)
Subjective:    Patient ID: John [name], male    DOB: January 02, 1946, 78 y.o.   MRN: [id number]  Chief Complaint  Patient presents with   Medical Management of Chronic Issues   Pt presents to the office today for  chronic care follow up. He is followed by Cardiologists  for CHF.    Followed by Oncologists every 3 months for non-small lung cancer. He completed radiation.     He is followed by Pulmonologist every 6 months for COPD. He continues to smoke 1/2 and complaining of increased SOB.  He is followed by Vascular for PAD.    He is followed by Urologists for BPH and Prostate.   He has seen GI for bleed, but stable now.     Has OSA and using CPAP nightly.   GI Problem The primary symptoms include fatigue and diarrhea (when taking linzess). Primary symptoms do not include weight loss.  The illness is also significant for constipation. Significant associated medical issues include GERD.  Hypertension This is a chronic problem. The current episode started more than 1 year ago. The problem has been resolved since onset. The problem is controlled. Associated symptoms include malaise/fatigue and shortness of breath. Pertinent negatives include no peripheral edema. Risk factors for coronary artery disease include dyslipidemia, obesity, male gender and sedentary lifestyle. The current treatment provides moderate improvement. Identifiable causes of hypertension include a thyroid problem.  Congestive Heart Failure Presents for follow-up visit. Associated symptoms include fatigue and shortness of breath. Pertinent negatives include no edema or nocturia. The symptoms have been stable.  Gastroesophageal Reflux He complains of belching and heartburn. He reports no globus sensation. This is a chronic problem. The current episode started more than 1 year ago. The problem occurs occasionally. Associated symptoms include fatigue. Pertinent negatives include no weight loss. He has tried a PPI for the  symptoms. The treatment provided moderate relief.  Hyperlipidemia This is a chronic problem. The current episode started more than 1 year ago. Exacerbating diseases include obesity. Associated symptoms include shortness of breath. Current antihyperlipidemic treatment includes statins and diet change. The current treatment provides moderate improvement of lipids. Risk factors for coronary artery disease include dyslipidemia, hypertension and a sedentary lifestyle.  Thyroid Problem Presents for follow-up visit. Symptoms include anxiety, constipation, diarrhea (when taking linzess) and fatigue. Patient reports no weight loss. The symptoms have been stable.  Arthritis Presents for follow-up visit. He complains of pain and stiffness. Affected locations include the left hip, left knee, right knee, right hip, left MCP and right MCP. His pain is at a severity of 6/10. Associated symptoms include diarrhea (when taking linzess) and fatigue. Pertinent negatives include no weight loss.  Benign Prostatic Hypertrophy This is a chronic problem. The current episode started more than 1 year ago. Irritative symptoms do not include frequency or nocturia.  Insomnia Primary symptoms: sleep disturbance, difficulty falling asleep, frequent awakening, malaise/fatigue.   The current episode started more than one year. The problem occurs intermittently. Past treatments include medication. The treatment provided moderate relief.  Constipation This is a chronic problem. The current episode started more than 1 year ago. His stool frequency is 1 time per day (with linzess). Associated symptoms include diarrhea (when taking linzess). Pertinent negatives include no weight loss. He has tried laxatives for the symptoms. The treatment provided moderate relief.  Diabetes He presents for his follow-up diabetic visit. He has type 2 diabetes mellitus. Hypoglycemia symptoms include nervousness/anxiousness. Associated symptoms include  fatigue and foot paresthesias.  Pertinent negatives for diabetes include no weight loss. Diabetic complications include peripheral neuropathy. Risk factors for coronary artery disease include diabetes mellitus, dyslipidemia, male sex, hypertension and sedentary lifestyle. He is following a generally healthy diet. His overall blood glucose range is 110-130 mg/dl.      Review of Systems  Constitutional:  Positive for fatigue and malaise/fatigue. Negative for weight loss.  Respiratory:  Positive for shortness of breath.   Gastrointestinal:  Positive for constipation, diarrhea (when taking linzess) and heartburn.  Genitourinary:  Negative for frequency and nocturia.  Musculoskeletal:  Positive for stiffness.  Psychiatric/Behavioral:  Positive for sleep disturbance. The patient is nervous/anxious.   All other systems reviewed and are negative.      Objective:   Physical Exam Vitals reviewed.  Constitutional:      General: He is not in acute distress.    Appearance: He is well-developed. He is obese.  HENT:     Head: Normocephalic.     Right Ear: Tympanic membrane normal.     Left Ear: Tympanic membrane normal.  Eyes:     General:        Right eye: No discharge.        Left eye: No discharge.     Pupils: Pupils are equal, round, and reactive to light.  Neck:     Thyroid: No thyromegaly.  Cardiovascular:     Rate and Rhythm: Normal rate and regular rhythm.     Heart sounds: Murmur heard.  Pulmonary:     Effort: Pulmonary effort is normal. No respiratory distress.     Breath sounds: Normal breath sounds. No wheezing.  Abdominal:     General: Bowel sounds are normal. There is no distension.     Palpations: Abdomen is soft.     Tenderness: There is no abdominal tenderness.  Musculoskeletal:        General: Tenderness (pain in right knee with flexion) present. Normal range of motion.     Cervical back: Normal range of motion and neck supple.  Skin:    General: Skin is warm and dry.      Findings: No erythema or rash.  Neurological:     Mental Status: He is alert and oriented to person, place, and time.     Cranial Nerves: No cranial nerve deficit.     Motor: Weakness present.     Gait: Gait abnormal (using cane).     Deep Tendon Reflexes: Reflexes are normal and symmetric.  Psychiatric:        Behavior: Behavior normal.        Thought Content: Thought content normal.        Judgment: Judgment normal.    Diabetic Foot Exam - Simple   Simple Foot Form Diabetic Foot exam was performed with the following findings: Yes 04/30/2023 12:10 PM  Visual Inspection No deformities, no ulcerations, no other skin breakdown bilaterally: Yes Sensation Testing See comments: Yes Pulse Check Posterior Tibialis and Dorsalis pulse intact bilaterally: Yes Comments Negative monofilament in bilateral feet        BP 134/76   Pulse 96   Temp (!) 97.4 F (36.3 C) (Temporal)   Ht 5\' 6"  (1.676 m)   Wt 152 lb 6.4 oz (69.1 kg)   SpO2 94%   BMI 24.60 kg/m      Assessment & Plan:  John [name] comes in today with chief complaint of Medical Management of Chronic Issues   Diagnosis and orders addressed:  1. Diabetic polyneuropathy associated  with type 2 diabetes mellitus (HCC) - gabapentin (NEURONTIN) 300 MG capsule; Take 1 capsule (300 mg total) by mouth 3 (three) times daily.  Dispense: 270 capsule; Refill: 2 - CMP14+EGFR - CBC with Differential/Platelet  2. Constipation, unspecified constipation type - linaclotide (LINZESS) 72 MCG capsule; Take 1 capsule (72 mcg total) by mouth daily before breakfast.  Dispense: 90 capsule; Refill: 1 - CMP14+EGFR - CBC with Differential/Platelet  3. Nausea - ondansetron (ZOFRAN) 4 MG tablet; Take 1 tablet (4 mg total) by mouth every 8 (eight) hours as needed for nausea or vomiting.  Dispense: 90 tablet; Refill: 1 - CMP14+EGFR - CBC with Differential/Platelet  4. GERD without esophagitis - pantoprazole (PROTONIX) 40 MG tablet;  Take 1 tablet (40 mg total) by mouth daily.  Dispense: 90 tablet; Refill: 0 - CMP14+EGFR - CBC with Differential/Platelet  5. Hyperlipidemia associated with type 2 diabetes mellitus (HCC) - pravastatin (PRAVACHOL) 40 MG tablet; TAKE 1 TABLET (40 MG TOTAL) BY MOUTH ON MONDAYS , WEDNESDAYS, AND FRIDAYS AS DIRECTED  Dispense: 48 tablet; Refill: 4 - CMP14+EGFR - CBC with Differential/Platelet  6. Primary insomnia - zolpidem (AMBIEN) 5 MG tablet; Take 1 tablet (5 mg total) by mouth at bedtime as needed for sleep.  Dispense: 30 tablet; Refill: 2 - CMP14+EGFR - CBC with Differential/Platelet  7. Hypertension associated with diabetes (HCC) (Primary) - CMP14+EGFR - CBC with Differential/Platelet  8. Chronic obstructive pulmonary disease, unspecified COPD type (HCC) - CMP14+EGFR - CBC with Differential/Platelet  9. BPH with urinary obstruction - CMP14+EGFR - CBC with Differential/Platelet  10. Non-small cell lung cancer, right (HCC) - CMP14+EGFR - CBC with Differential/Platelet  11. Chronic diastolic CHF (congestive heart failure) (HCC) - CMP14+EGFR - CBC with Differential/Platelet  12. Type 2 diabetes mellitus with diabetic polyneuropathy, without long-term current use of insulin (HCC) - Bayer DCA Hb A1c Waived - CMP14+EGFR - CBC with Differential/Platelet  13. Hypothyroidism, unspecified type - CMP14+EGFR - CBC with Differential/Platelet  14. Nicotine dependence, cigarettes, uncomplicated - CMP14+EGFR - CBC with Differential/Platelet  15. Mixed diabetic hyperlipidemia associated with type 2 diabetes mellitus (HCC) - CMP14+EGFR - CBC with Differential/Platelet  16. Primary osteoarthritis involving multiple joints - CMP14+EGFR - CBC with Differential/Platelet  17. Obstructive sleep apnea treated with continuous positive airway pressure (CPAP) - CMP14+EGFR - CBC with Differential/Platelet  18. COPD GOLD III/ group D still smoking  - CMP14+EGFR - CBC with  Differential/Platelet    Labs pending Continue medications  Keep follow up with specialists  Health Maintenance reviewed Diet and exercise encouraged  Follow up plan: 3 months    Jannifer Rodney, FNP

## 2023-05-01 ENCOUNTER — Other Ambulatory Visit: Payer: Self-pay | Admitting: Family

## 2023-05-01 DIAGNOSIS — H3530 Unspecified macular degeneration: Secondary | ICD-10-CM | POA: Diagnosis not present

## 2023-05-01 DIAGNOSIS — K8020 Calculus of gallbladder without cholecystitis without obstruction: Secondary | ICD-10-CM | POA: Diagnosis not present

## 2023-05-01 DIAGNOSIS — E1159 Type 2 diabetes mellitus with other circulatory complications: Secondary | ICD-10-CM | POA: Diagnosis not present

## 2023-05-01 DIAGNOSIS — E1142 Type 2 diabetes mellitus with diabetic polyneuropathy: Secondary | ICD-10-CM | POA: Diagnosis not present

## 2023-05-01 DIAGNOSIS — I5033 Acute on chronic diastolic (congestive) heart failure: Secondary | ICD-10-CM | POA: Diagnosis not present

## 2023-05-01 DIAGNOSIS — J4489 Other specified chronic obstructive pulmonary disease: Secondary | ICD-10-CM | POA: Diagnosis not present

## 2023-05-01 DIAGNOSIS — E1169 Type 2 diabetes mellitus with other specified complication: Secondary | ICD-10-CM | POA: Diagnosis not present

## 2023-05-01 DIAGNOSIS — I152 Hypertension secondary to endocrine disorders: Secondary | ICD-10-CM | POA: Diagnosis not present

## 2023-05-01 DIAGNOSIS — E782 Mixed hyperlipidemia: Secondary | ICD-10-CM | POA: Diagnosis not present

## 2023-05-01 DIAGNOSIS — I4891 Unspecified atrial fibrillation: Secondary | ICD-10-CM | POA: Diagnosis not present

## 2023-05-01 DIAGNOSIS — I4892 Unspecified atrial flutter: Secondary | ICD-10-CM | POA: Diagnosis not present

## 2023-05-01 DIAGNOSIS — F32A Depression, unspecified: Secondary | ICD-10-CM | POA: Diagnosis not present

## 2023-05-01 DIAGNOSIS — R338 Other retention of urine: Secondary | ICD-10-CM | POA: Diagnosis not present

## 2023-05-01 DIAGNOSIS — N138 Other obstructive and reflux uropathy: Secondary | ICD-10-CM | POA: Diagnosis not present

## 2023-05-01 DIAGNOSIS — D62 Acute posthemorrhagic anemia: Secondary | ICD-10-CM | POA: Diagnosis not present

## 2023-05-01 DIAGNOSIS — F17210 Nicotine dependence, cigarettes, uncomplicated: Secondary | ICD-10-CM | POA: Diagnosis not present

## 2023-05-01 DIAGNOSIS — F419 Anxiety disorder, unspecified: Secondary | ICD-10-CM | POA: Diagnosis not present

## 2023-05-01 DIAGNOSIS — M150 Primary generalized (osteo)arthritis: Secondary | ICD-10-CM | POA: Diagnosis not present

## 2023-05-01 DIAGNOSIS — E039 Hypothyroidism, unspecified: Secondary | ICD-10-CM | POA: Diagnosis not present

## 2023-05-01 DIAGNOSIS — N401 Enlarged prostate with lower urinary tract symptoms: Secondary | ICD-10-CM | POA: Diagnosis not present

## 2023-05-01 DIAGNOSIS — I700 Atherosclerosis of aorta: Secondary | ICD-10-CM | POA: Diagnosis not present

## 2023-05-01 DIAGNOSIS — C3491 Malignant neoplasm of unspecified part of right bronchus or lung: Secondary | ICD-10-CM | POA: Diagnosis not present

## 2023-05-01 DIAGNOSIS — I6521 Occlusion and stenosis of right carotid artery: Secondary | ICD-10-CM | POA: Diagnosis not present

## 2023-05-01 DIAGNOSIS — F5101 Primary insomnia: Secondary | ICD-10-CM | POA: Diagnosis not present

## 2023-05-01 DIAGNOSIS — E1151 Type 2 diabetes mellitus with diabetic peripheral angiopathy without gangrene: Secondary | ICD-10-CM | POA: Diagnosis not present

## 2023-05-01 LAB — CMP14+EGFR
ALT: 8 [IU]/L (ref 0–44)
AST: 14 [IU]/L (ref 0–40)
Albumin: 3.8 g/dL (ref 3.8–4.8)
Alkaline Phosphatase: 117 [IU]/L (ref 44–121)
BUN/Creatinine Ratio: 19 (ref 10–24)
BUN: 16 mg/dL (ref 8–27)
Bilirubin Total: 0.3 mg/dL (ref 0.0–1.2)
CO2: 30 mmol/L — ABNORMAL HIGH (ref 20–29)
Calcium: 9.2 mg/dL (ref 8.6–10.2)
Chloride: 101 mmol/L (ref 96–106)
Creatinine, Ser: 0.85 mg/dL (ref 0.76–1.27)
Globulin, Total: 2.5 g/dL (ref 1.5–4.5)
Glucose: 122 mg/dL — ABNORMAL HIGH (ref 70–99)
Potassium: 4 mmol/L (ref 3.5–5.2)
Sodium: 145 mmol/L — ABNORMAL HIGH (ref 134–144)
Total Protein: 6.3 g/dL (ref 6.0–8.5)
eGFR: 89 mL/min/{1.73_m2} (ref >59)

## 2023-05-01 LAB — CBC WITH DIFFERENTIAL/PLATELET
Basophils Absolute: 0 10*3/uL (ref 0.0–0.2)
Basos: 1 %
EOS (ABSOLUTE): 0.1 10*3/uL (ref 0.0–0.4)
Eos: 1 %
Hematocrit: 42.3 % (ref 37.5–51.0)
Hemoglobin: 13.2 g/dL (ref 13.0–17.7)
Immature Grans (Abs): 0 10*3/uL (ref 0.0–0.1)
Immature Granulocytes: 0 %
Lymphocytes Absolute: 0.5 10*3/uL — ABNORMAL LOW (ref 0.7–3.1)
Lymphs: 6 %
MCH: 30.4 pg (ref 26.6–33.0)
MCHC: 31.2 g/dL — ABNORMAL LOW (ref 31.5–35.7)
MCV: 98 fL — ABNORMAL HIGH (ref 79–97)
Monocytes Absolute: 0.7 10*3/uL (ref 0.1–0.9)
Monocytes: 9 %
Neutrophils Absolute: 6.8 10*3/uL (ref 1.4–7.0)
Neutrophils: 83 %
Platelets: 345 10*3/uL (ref 150–450)
RBC: 4.34 x10E6/uL (ref 4.14–5.80)
RDW: 17.7 % — ABNORMAL HIGH (ref 11.6–15.4)
WBC: 8.2 10*3/uL (ref 3.4–10.8)

## 2023-05-01 MED ORDER — TRESIBA FLEXTOUCH 200 UNIT/ML ~~LOC~~ SOPN: 10.0000 [IU] | PEN_INJECTOR | Freq: Every evening | SUBCUTANEOUS | 3 refills | Status: DC

## 2023-05-05 DIAGNOSIS — E1142 Type 2 diabetes mellitus with diabetic polyneuropathy: Secondary | ICD-10-CM | POA: Diagnosis not present

## 2023-05-05 DIAGNOSIS — L84 Corns and callosities: Secondary | ICD-10-CM | POA: Diagnosis not present

## 2023-05-05 DIAGNOSIS — B351 Tinea unguium: Secondary | ICD-10-CM | POA: Diagnosis not present

## 2023-05-05 DIAGNOSIS — M79676 Pain in unspecified toe(s): Secondary | ICD-10-CM | POA: Diagnosis not present

## 2023-05-06 DIAGNOSIS — I152 Hypertension secondary to endocrine disorders: Secondary | ICD-10-CM | POA: Diagnosis not present

## 2023-05-06 DIAGNOSIS — D62 Acute posthemorrhagic anemia: Secondary | ICD-10-CM | POA: Diagnosis not present

## 2023-05-06 DIAGNOSIS — I4891 Unspecified atrial fibrillation: Secondary | ICD-10-CM | POA: Diagnosis not present

## 2023-05-06 DIAGNOSIS — E1142 Type 2 diabetes mellitus with diabetic polyneuropathy: Secondary | ICD-10-CM | POA: Diagnosis not present

## 2023-05-06 DIAGNOSIS — I5033 Acute on chronic diastolic (congestive) heart failure: Secondary | ICD-10-CM | POA: Diagnosis not present

## 2023-05-06 DIAGNOSIS — E1159 Type 2 diabetes mellitus with other circulatory complications: Secondary | ICD-10-CM | POA: Diagnosis not present

## 2023-05-07 ENCOUNTER — Ambulatory Visit: Payer: Medicare Other

## 2023-05-07 NOTE — Progress Notes (Signed)
Electrophysiology Office Follow up Visit Note:    Date:  05/08/2023   ID:  John [name], DOB 1945/07/18, MRN [id number]  PCP:  Junie Spencer, FNP  [facility] Cardiologist:  John Harps, MD  [facility] Electrophysiologist:  John Prude, MD    Interval History:     John [name] is a 77 y.o. male who presents for a follow up visit.  The patient was last seen by [facility] April 09, 2023.  The patient has a history of typical atrial flutter, chronic anemia, diastolic heart failure, hypertension.  The patient has advanced COPD and is on nighttime oxygen.  He uses a rescue inhaler multiple times per day. Today he tells me that he is feeling better with improved heart rates.  He is not taking diltiazem and has only been taking propranolol.  He thinks this has helped his heart rates and symptoms.  He is using wheelchair today.  He still uses nighttime oxygen.  He is with his wife in clinic.        Past medical, surgical, social and family history were reviewed.  ROS:   Please see the history of present illness.    All other systems reviewed and are negative.  EKGs/Labs/Other Studies Reviewed:    The following studies were reviewed today:  April 03, 2023 EKG shows atrial tachycardia with ventricular rate in the 1 10-1 20 range.  Right bundle branch block  April 09, 2023 EKG shows atrial tachycardia with ventricular rate in the 1 10-1 20 range.  Right bundle branch block.   EKG Interpretation Date/Time:  Friday May 08 2023 12:09:36 EST Ventricular Rate:  88 PR Interval:  222 QRS Duration:  110 QT Interval:  386 QTC Calculation: 467 R Axis:   32  Text Interpretation: Sinus rhythm with 1st degree A-V block Right bundle branch block Confirmed by John [name] [phone]) on 05/08/2023 1:15:45 PM    Physical Exam:    VS:  BP 110/64 (BP Location: Right Arm, Patient Position: Sitting, Cuff Size: Normal)   Pulse 88   Ht 5\' 6"  (1.676 m)   Wt 154 lb  (69.9 kg)   SpO2 94%   BMI 24.86 kg/m     Wt Readings from Last 3 Encounters:  05/08/23 154 lb (69.9 kg)  04/30/23 152 lb 6.4 oz (69.1 kg)  04/27/23 152 lb 12.8 oz (69.3 kg)     GEN: no distress.  Elderly, in wheelchair CARD: RRR, No MRG RESP: No IWOB. CTAB.      ASSESSMENT:    1. Atrial tachycardia (HCC)   2. Typical atrial flutter (HCC)    PLAN:    In order of problems listed above:  #Atrial tachycardia At the last appoint with John [name] he was prescribed propranolol with instructions to try diltiazem at the propranolol proved ineffective.  I suspect his atrial tachycardias are secondary to his history of lung cancer and pulmonary disease.  Amiodarone is not going to be an option for him given his advanced lung disease.  His QTc is too long for consideration of Tikosyn.  He has known coronary disease making class Ic agents unsafe. Average heart rates around 100 bpm.  Today his rhythm is sinus with first-degree AV delay.  His heart rates have improved on propranolol.  I will discontinue diltiazem and remove it from his medication list.  He will continue taking propranolol.  Follow-up 6 months with John [name].  Signed, John Dunn, MD, [facility], [facility] 05/08/2023 1:16 PM  Electrophysiology [facility]

## 2023-05-08 ENCOUNTER — Encounter: Payer: Self-pay | Admitting: Cardiology

## 2023-05-08 ENCOUNTER — Ambulatory Visit: Payer: Medicare Other | Attending: Cardiology | Admitting: Cardiology

## 2023-05-08 VITALS — BP 110/64 | HR 88 | Ht 66.0 in | Wt 154.0 lb

## 2023-05-08 DIAGNOSIS — I483 Typical atrial flutter: Secondary | ICD-10-CM | POA: Insufficient documentation

## 2023-05-08 DIAGNOSIS — I4719 Other supraventricular tachycardia: Secondary | ICD-10-CM | POA: Insufficient documentation

## 2023-05-08 NOTE — Patient Instructions (Signed)
Medication Instructions:  Your physician has recommended you make the following change in your medication:  1) STOP taking diltiazem  *If you need a refill on your cardiac medications before your next appointment, please call your pharmacy*  Follow-Up: At [facility], you and your health needs are our priority.  As part of our continuing mission to provide you with exceptional heart care, we have created designated Provider Care Teams.  These Care Teams include your primary Cardiologist (physician) and Advanced Practice Providers (APPs -  Physician Assistants and Nurse Practitioners) who all work together to provide you with the care you need, when you need it.  Your next appointment:   6 months  Provider:   Casimiro Needle "Otilio Saber, [place]

## 2023-05-14 ENCOUNTER — Other Ambulatory Visit: Payer: Self-pay | Admitting: Adult Health

## 2023-05-15 DIAGNOSIS — E1159 Type 2 diabetes mellitus with other circulatory complications: Secondary | ICD-10-CM | POA: Diagnosis not present

## 2023-05-15 DIAGNOSIS — I5033 Acute on chronic diastolic (congestive) heart failure: Secondary | ICD-10-CM | POA: Diagnosis not present

## 2023-05-15 DIAGNOSIS — E1142 Type 2 diabetes mellitus with diabetic polyneuropathy: Secondary | ICD-10-CM | POA: Diagnosis not present

## 2023-05-15 DIAGNOSIS — I152 Hypertension secondary to endocrine disorders: Secondary | ICD-10-CM | POA: Diagnosis not present

## 2023-05-15 DIAGNOSIS — D62 Acute posthemorrhagic anemia: Secondary | ICD-10-CM | POA: Diagnosis not present

## 2023-05-15 DIAGNOSIS — I4891 Unspecified atrial fibrillation: Secondary | ICD-10-CM | POA: Diagnosis not present

## 2023-05-18 ENCOUNTER — Encounter: Payer: Medicare Other | Admitting: Student

## 2023-05-21 ENCOUNTER — Inpatient Hospital Stay: Payer: Medicare Other

## 2023-05-21 DIAGNOSIS — E1159 Type 2 diabetes mellitus with other circulatory complications: Secondary | ICD-10-CM | POA: Diagnosis not present

## 2023-05-21 DIAGNOSIS — I152 Hypertension secondary to endocrine disorders: Secondary | ICD-10-CM | POA: Diagnosis not present

## 2023-05-21 DIAGNOSIS — D62 Acute posthemorrhagic anemia: Secondary | ICD-10-CM | POA: Diagnosis not present

## 2023-05-21 DIAGNOSIS — E1142 Type 2 diabetes mellitus with diabetic polyneuropathy: Secondary | ICD-10-CM | POA: Diagnosis not present

## 2023-05-21 DIAGNOSIS — I5033 Acute on chronic diastolic (congestive) heart failure: Secondary | ICD-10-CM | POA: Diagnosis not present

## 2023-05-21 DIAGNOSIS — I4891 Unspecified atrial fibrillation: Secondary | ICD-10-CM | POA: Diagnosis not present

## 2023-05-25 ENCOUNTER — Ambulatory Visit (INDEPENDENT_AMBULATORY_CARE_PROVIDER_SITE_OTHER): Payer: Medicare Other

## 2023-05-25 DIAGNOSIS — I483 Typical atrial flutter: Secondary | ICD-10-CM

## 2023-05-26 LAB — CUP PACEART REMOTE DEVICE CHECK
Date Time Interrogation Session: 20250217002200
Pulse Gen Serial Number: 104324

## 2023-05-27 ENCOUNTER — Encounter: Payer: Self-pay | Admitting: Cardiology

## 2023-05-27 NOTE — Progress Notes (Signed)
 Carelink Summary Report / Loop Recorder

## 2023-05-28 NOTE — Progress Notes (Signed)
 Bsx Loop Recorder

## 2023-05-29 ENCOUNTER — Inpatient Hospital Stay (HOSPITAL_COMMUNITY)
Admission: EM | Admit: 2023-05-29 | Discharge: 2023-05-31 | DRG: 864 | Disposition: A | Discharge status: Home / Self Care | Payer: Medicare Other | Attending: Internal Medicine | Admitting: Internal Medicine

## 2023-05-29 ENCOUNTER — Encounter (HOSPITAL_COMMUNITY): Payer: Self-pay

## 2023-05-29 ENCOUNTER — Other Ambulatory Visit: Payer: Self-pay

## 2023-05-29 ENCOUNTER — Emergency Department (HOSPITAL_COMMUNITY): Payer: Medicare Other

## 2023-05-29 DIAGNOSIS — F17210 Nicotine dependence, cigarettes, uncomplicated: Secondary | ICD-10-CM | POA: Diagnosis present

## 2023-05-29 DIAGNOSIS — E782 Mixed hyperlipidemia: Secondary | ICD-10-CM | POA: Diagnosis not present

## 2023-05-29 DIAGNOSIS — F419 Anxiety disorder, unspecified: Secondary | ICD-10-CM | POA: Diagnosis not present

## 2023-05-29 DIAGNOSIS — Z794 Long term (current) use of insulin: Secondary | ICD-10-CM

## 2023-05-29 DIAGNOSIS — E871 Hypo-osmolality and hyponatremia: Secondary | ICD-10-CM | POA: Diagnosis present

## 2023-05-29 DIAGNOSIS — G4733 Obstructive sleep apnea (adult) (pediatric): Secondary | ICD-10-CM | POA: Diagnosis present

## 2023-05-29 DIAGNOSIS — Z7902 Long term (current) use of antithrombotics/antiplatelets: Secondary | ICD-10-CM

## 2023-05-29 DIAGNOSIS — R911 Solitary pulmonary nodule: Secondary | ICD-10-CM | POA: Diagnosis present

## 2023-05-29 DIAGNOSIS — Z888 Allergy status to other drugs, medicaments and biological substances status: Secondary | ICD-10-CM

## 2023-05-29 DIAGNOSIS — K8020 Calculus of gallbladder without cholecystitis without obstruction: Secondary | ICD-10-CM | POA: Diagnosis not present

## 2023-05-29 DIAGNOSIS — E1140 Type 2 diabetes mellitus with diabetic neuropathy, unspecified: Secondary | ICD-10-CM | POA: Diagnosis present

## 2023-05-29 DIAGNOSIS — I5033 Acute on chronic diastolic (congestive) heart failure: Secondary | ICD-10-CM | POA: Diagnosis not present

## 2023-05-29 DIAGNOSIS — Z79890 Hormone replacement therapy (postmenopausal): Secondary | ICD-10-CM

## 2023-05-29 DIAGNOSIS — Z881 Allergy status to other antibiotic agents status: Secondary | ICD-10-CM

## 2023-05-29 DIAGNOSIS — E785 Hyperlipidemia, unspecified: Secondary | ICD-10-CM | POA: Diagnosis present

## 2023-05-29 DIAGNOSIS — Z87442 Personal history of urinary calculi: Secondary | ICD-10-CM

## 2023-05-29 DIAGNOSIS — I4892 Unspecified atrial flutter: Secondary | ICD-10-CM | POA: Diagnosis present

## 2023-05-29 DIAGNOSIS — J449 Chronic obstructive pulmonary disease, unspecified: Secondary | ICD-10-CM | POA: Diagnosis not present

## 2023-05-29 DIAGNOSIS — I4891 Unspecified atrial fibrillation: Secondary | ICD-10-CM | POA: Diagnosis not present

## 2023-05-29 DIAGNOSIS — M109 Gout, unspecified: Secondary | ICD-10-CM | POA: Diagnosis present

## 2023-05-29 DIAGNOSIS — F5101 Primary insomnia: Secondary | ICD-10-CM | POA: Diagnosis not present

## 2023-05-29 DIAGNOSIS — J4489 Other specified chronic obstructive pulmonary disease: Secondary | ICD-10-CM | POA: Diagnosis present

## 2023-05-29 DIAGNOSIS — R0602 Shortness of breath: Principal | ICD-10-CM

## 2023-05-29 DIAGNOSIS — Z1152 Encounter for screening for COVID-19: Secondary | ICD-10-CM | POA: Diagnosis not present

## 2023-05-29 DIAGNOSIS — Z885 Allergy status to narcotic agent status: Secondary | ICD-10-CM

## 2023-05-29 DIAGNOSIS — R6510 Systemic inflammatory response syndrome (SIRS) of non-infectious origin without acute organ dysfunction: Secondary | ICD-10-CM | POA: Diagnosis not present

## 2023-05-29 DIAGNOSIS — R410 Disorientation, unspecified: Secondary | ICD-10-CM | POA: Diagnosis not present

## 2023-05-29 DIAGNOSIS — I700 Atherosclerosis of aorta: Secondary | ICD-10-CM | POA: Diagnosis not present

## 2023-05-29 DIAGNOSIS — E1142 Type 2 diabetes mellitus with diabetic polyneuropathy: Secondary | ICD-10-CM | POA: Diagnosis not present

## 2023-05-29 DIAGNOSIS — I509 Heart failure, unspecified: Secondary | ICD-10-CM | POA: Diagnosis present

## 2023-05-29 DIAGNOSIS — N138 Other obstructive and reflux uropathy: Secondary | ICD-10-CM | POA: Diagnosis not present

## 2023-05-29 DIAGNOSIS — Z9889 Other specified postprocedural states: Secondary | ICD-10-CM

## 2023-05-29 DIAGNOSIS — R509 Fever, unspecified: Principal | ICD-10-CM | POA: Diagnosis present

## 2023-05-29 DIAGNOSIS — I110 Hypertensive heart disease with heart failure: Secondary | ICD-10-CM | POA: Diagnosis present

## 2023-05-29 DIAGNOSIS — I6521 Occlusion and stenosis of right carotid artery: Secondary | ICD-10-CM | POA: Diagnosis not present

## 2023-05-29 DIAGNOSIS — E039 Hypothyroidism, unspecified: Secondary | ICD-10-CM | POA: Diagnosis present

## 2023-05-29 DIAGNOSIS — M150 Primary generalized (osteo)arthritis: Secondary | ICD-10-CM | POA: Diagnosis not present

## 2023-05-29 DIAGNOSIS — I959 Hypotension, unspecified: Secondary | ICD-10-CM | POA: Diagnosis not present

## 2023-05-29 DIAGNOSIS — Z79899 Other long term (current) drug therapy: Secondary | ICD-10-CM

## 2023-05-29 DIAGNOSIS — I152 Hypertension secondary to endocrine disorders: Secondary | ICD-10-CM | POA: Diagnosis not present

## 2023-05-29 DIAGNOSIS — D638 Anemia in other chronic diseases classified elsewhere: Secondary | ICD-10-CM | POA: Diagnosis present

## 2023-05-29 DIAGNOSIS — Z882 Allergy status to sulfonamides status: Secondary | ICD-10-CM

## 2023-05-29 DIAGNOSIS — R000 Tachycardia, unspecified: Secondary | ICD-10-CM | POA: Diagnosis not present

## 2023-05-29 DIAGNOSIS — Z923 Personal history of irradiation: Secondary | ICD-10-CM

## 2023-05-29 DIAGNOSIS — Z8249 Family history of ischemic heart disease and other diseases of the circulatory system: Secondary | ICD-10-CM

## 2023-05-29 DIAGNOSIS — Z7951 Long term (current) use of inhaled steroids: Secondary | ICD-10-CM

## 2023-05-29 DIAGNOSIS — Z833 Family history of diabetes mellitus: Secondary | ICD-10-CM

## 2023-05-29 DIAGNOSIS — Z85118 Personal history of other malignant neoplasm of bronchus and lung: Secondary | ICD-10-CM

## 2023-05-29 DIAGNOSIS — J9611 Chronic respiratory failure with hypoxia: Secondary | ICD-10-CM | POA: Diagnosis present

## 2023-05-29 DIAGNOSIS — Z95828 Presence of other vascular implants and grafts: Secondary | ICD-10-CM

## 2023-05-29 DIAGNOSIS — H3530 Unspecified macular degeneration: Secondary | ICD-10-CM | POA: Diagnosis present

## 2023-05-29 DIAGNOSIS — R110 Nausea: Secondary | ICD-10-CM | POA: Diagnosis present

## 2023-05-29 DIAGNOSIS — R338 Other retention of urine: Secondary | ICD-10-CM | POA: Diagnosis not present

## 2023-05-29 DIAGNOSIS — E1159 Type 2 diabetes mellitus with other circulatory complications: Secondary | ICD-10-CM | POA: Diagnosis not present

## 2023-05-29 DIAGNOSIS — E1151 Type 2 diabetes mellitus with diabetic peripheral angiopathy without gangrene: Secondary | ICD-10-CM | POA: Diagnosis present

## 2023-05-29 DIAGNOSIS — D72829 Elevated white blood cell count, unspecified: Secondary | ICD-10-CM | POA: Diagnosis present

## 2023-05-29 DIAGNOSIS — F32A Depression, unspecified: Secondary | ICD-10-CM | POA: Diagnosis not present

## 2023-05-29 DIAGNOSIS — D62 Acute posthemorrhagic anemia: Secondary | ICD-10-CM | POA: Diagnosis not present

## 2023-05-29 DIAGNOSIS — R0902 Hypoxemia: Secondary | ICD-10-CM | POA: Diagnosis not present

## 2023-05-29 DIAGNOSIS — Z9842 Cataract extraction status, left eye: Secondary | ICD-10-CM

## 2023-05-29 DIAGNOSIS — A419 Sepsis, unspecified organism: Secondary | ICD-10-CM | POA: Diagnosis present

## 2023-05-29 DIAGNOSIS — N400 Enlarged prostate without lower urinary tract symptoms: Secondary | ICD-10-CM | POA: Diagnosis present

## 2023-05-29 DIAGNOSIS — Z95820 Peripheral vascular angioplasty status with implants and grafts: Secondary | ICD-10-CM

## 2023-05-29 DIAGNOSIS — Z823 Family history of stroke: Secondary | ICD-10-CM

## 2023-05-29 DIAGNOSIS — Z9841 Cataract extraction status, right eye: Secondary | ICD-10-CM

## 2023-05-29 DIAGNOSIS — N401 Enlarged prostate with lower urinary tract symptoms: Secondary | ICD-10-CM | POA: Diagnosis not present

## 2023-05-29 DIAGNOSIS — E1169 Type 2 diabetes mellitus with other specified complication: Secondary | ICD-10-CM | POA: Diagnosis not present

## 2023-05-29 DIAGNOSIS — C3491 Malignant neoplasm of unspecified part of right bronchus or lung: Secondary | ICD-10-CM | POA: Diagnosis not present

## 2023-05-29 DIAGNOSIS — Z9079 Acquired absence of other genital organ(s): Secondary | ICD-10-CM

## 2023-05-29 LAB — COMPREHENSIVE METABOLIC PANEL
ALT: 20 U/L (ref 0–44)
AST: 22 U/L (ref 15–41)
Albumin: 3.2 g/dL — ABNORMAL LOW (ref 3.5–5.0)
Alkaline Phosphatase: 125 U/L (ref 38–126)
Anion gap: 13 (ref 5–15)
BUN: 20 mg/dL (ref 8–23)
CO2: 24 mmol/L (ref 22–32)
Calcium: 9.1 mg/dL (ref 8.9–10.3)
Chloride: 96 mmol/L — ABNORMAL LOW (ref 98–111)
Creatinine, Ser: 1.21 mg/dL (ref 0.61–1.24)
GFR, Estimated: >60 mL/min (ref >60)
Glucose, Bld: 157 mg/dL — ABNORMAL HIGH (ref 70–99)
Potassium: 4.1 mmol/L (ref 3.5–5.1)
Sodium: 133 mmol/L — ABNORMAL LOW (ref 135–145)
Total Bilirubin: 0.5 mg/dL (ref 0.0–1.2)
Total Protein: 7.2 g/dL (ref 6.5–8.1)

## 2023-05-29 LAB — CBC WITH DIFFERENTIAL/PLATELET
Abs Immature Granulocytes: 0.1 10*3/uL — ABNORMAL HIGH (ref 0.00–0.07)
Basophils Absolute: 0.1 10*3/uL (ref 0.0–0.1)
Basophils Relative: 1 %
Eosinophils Absolute: 0.1 10*3/uL (ref 0.0–0.5)
Eosinophils Relative: 0 %
HCT: 43.5 % (ref 39.0–52.0)
Hemoglobin: 14.2 g/dL (ref 13.0–17.0)
Immature Granulocytes: 1 %
Lymphocytes Relative: 2 %
Lymphs Abs: 0.4 10*3/uL — ABNORMAL LOW (ref 0.7–4.0)
MCH: 32 pg (ref 26.0–34.0)
MCHC: 32.6 g/dL (ref 30.0–36.0)
MCV: 98 fL (ref 80.0–100.0)
Monocytes Absolute: 1 10*3/uL (ref 0.1–1.0)
Monocytes Relative: 6 %
Neutro Abs: 15.7 10*3/uL — ABNORMAL HIGH (ref 1.7–7.7)
Neutrophils Relative %: 90 %
Platelets: 364 10*3/uL (ref 150–400)
RBC: 4.44 MIL/uL (ref 4.22–5.81)
RDW: 15 % (ref 11.5–15.5)
WBC: 17.2 10*3/uL — ABNORMAL HIGH (ref 4.0–10.5)
nRBC: 0 % (ref 0.0–0.2)

## 2023-05-29 LAB — I-STAT CG4 LACTIC ACID, ED: Lactic Acid, Venous: 1.3 mmol/L (ref 0.5–1.9)

## 2023-05-29 LAB — APTT: aPTT: 32 s (ref 24–36)

## 2023-05-29 LAB — PROTIME-INR
INR: 1 (ref 0.8–1.2)
Prothrombin Time: 13.8 s (ref 11.4–15.2)

## 2023-05-29 MED ORDER — SODIUM CHLORIDE 0.9 % IV BOLUS (SEPSIS)
1000.0000 mL | Freq: Once | INTRAVENOUS | Status: AC
  Administered 2023-05-29: 1000 mL via INTRAVENOUS

## 2023-05-29 MED ORDER — LACTATED RINGERS IV SOLN: INTRAVENOUS | Status: DC

## 2023-05-29 MED ORDER — SODIUM CHLORIDE 0.9 % IV BOLUS (SEPSIS)
250.0000 mL | Freq: Once | INTRAVENOUS | Status: AC
  Administered 2023-05-30: 250 mL via INTRAVENOUS

## 2023-05-29 MED ORDER — SODIUM CHLORIDE 0.9 % IV SOLN
500.0000 mg | Freq: Once | INTRAVENOUS | Status: AC
  Administered 2023-05-30: 500 mg via INTRAVENOUS
  Filled 2023-05-29: qty 5

## 2023-05-29 NOTE — Sepsis Progress Note (Signed)
 Elink monitoring for the code sepsis protocol.

## 2023-05-29 NOTE — ED Triage Notes (Addendum)
Pt BIB EMS from home. Pt's wife called out and reports he was hot to touch and  "not feeling like himself". Upon EMS arrival pt had labored breathing. EMS started on CPAP but then there was a drop in BP, started NRB.  EMS reports bilateral rales. Pt reports they did take a tylenol around 1800.  Hx DM, lung cancer. A&Ox4. GCS 15  EMS 99% NRB 10L Cap 33 120-150P 99/57 BP,  101.8T 20LAC 18RAC 1g rocephin IM.

## 2023-05-29 NOTE — ED Notes (Signed)
[phone] POA [name] called for update

## 2023-05-30 DIAGNOSIS — F17210 Nicotine dependence, cigarettes, uncomplicated: Secondary | ICD-10-CM | POA: Diagnosis present

## 2023-05-30 DIAGNOSIS — I4892 Unspecified atrial flutter: Secondary | ICD-10-CM | POA: Diagnosis present

## 2023-05-30 DIAGNOSIS — R000 Tachycardia, unspecified: Secondary | ICD-10-CM | POA: Diagnosis present

## 2023-05-30 DIAGNOSIS — E785 Hyperlipidemia, unspecified: Secondary | ICD-10-CM | POA: Diagnosis present

## 2023-05-30 DIAGNOSIS — R6510 Systemic inflammatory response syndrome (SIRS) of non-infectious origin without acute organ dysfunction: Secondary | ICD-10-CM | POA: Diagnosis present

## 2023-05-30 DIAGNOSIS — K8020 Calculus of gallbladder without cholecystitis without obstruction: Secondary | ICD-10-CM | POA: Diagnosis not present

## 2023-05-30 DIAGNOSIS — I5033 Acute on chronic diastolic (congestive) heart failure: Secondary | ICD-10-CM | POA: Diagnosis not present

## 2023-05-30 DIAGNOSIS — Z95820 Peripheral vascular angioplasty status with implants and grafts: Secondary | ICD-10-CM | POA: Diagnosis not present

## 2023-05-30 DIAGNOSIS — M109 Gout, unspecified: Secondary | ICD-10-CM | POA: Diagnosis present

## 2023-05-30 DIAGNOSIS — G4733 Obstructive sleep apnea (adult) (pediatric): Secondary | ICD-10-CM | POA: Diagnosis present

## 2023-05-30 DIAGNOSIS — I6521 Occlusion and stenosis of right carotid artery: Secondary | ICD-10-CM | POA: Diagnosis not present

## 2023-05-30 DIAGNOSIS — E1142 Type 2 diabetes mellitus with diabetic polyneuropathy: Secondary | ICD-10-CM | POA: Diagnosis not present

## 2023-05-30 DIAGNOSIS — Z794 Long term (current) use of insulin: Secondary | ICD-10-CM | POA: Diagnosis not present

## 2023-05-30 DIAGNOSIS — R110 Nausea: Secondary | ICD-10-CM | POA: Diagnosis present

## 2023-05-30 DIAGNOSIS — E1169 Type 2 diabetes mellitus with other specified complication: Secondary | ICD-10-CM | POA: Diagnosis not present

## 2023-05-30 DIAGNOSIS — E782 Mixed hyperlipidemia: Secondary | ICD-10-CM | POA: Diagnosis not present

## 2023-05-30 DIAGNOSIS — I700 Atherosclerosis of aorta: Secondary | ICD-10-CM | POA: Diagnosis not present

## 2023-05-30 DIAGNOSIS — I509 Heart failure, unspecified: Secondary | ICD-10-CM | POA: Diagnosis present

## 2023-05-30 DIAGNOSIS — E1159 Type 2 diabetes mellitus with other circulatory complications: Secondary | ICD-10-CM | POA: Diagnosis not present

## 2023-05-30 DIAGNOSIS — N138 Other obstructive and reflux uropathy: Secondary | ICD-10-CM | POA: Diagnosis not present

## 2023-05-30 DIAGNOSIS — E1140 Type 2 diabetes mellitus with diabetic neuropathy, unspecified: Secondary | ICD-10-CM | POA: Diagnosis present

## 2023-05-30 DIAGNOSIS — I152 Hypertension secondary to endocrine disorders: Secondary | ICD-10-CM | POA: Diagnosis not present

## 2023-05-30 DIAGNOSIS — M150 Primary generalized (osteo)arthritis: Secondary | ICD-10-CM | POA: Diagnosis not present

## 2023-05-30 DIAGNOSIS — R0602 Shortness of breath: Secondary | ICD-10-CM | POA: Diagnosis present

## 2023-05-30 DIAGNOSIS — I4891 Unspecified atrial fibrillation: Secondary | ICD-10-CM | POA: Diagnosis not present

## 2023-05-30 DIAGNOSIS — H3530 Unspecified macular degeneration: Secondary | ICD-10-CM | POA: Diagnosis present

## 2023-05-30 DIAGNOSIS — F5101 Primary insomnia: Secondary | ICD-10-CM | POA: Diagnosis not present

## 2023-05-30 DIAGNOSIS — J4489 Other specified chronic obstructive pulmonary disease: Secondary | ICD-10-CM | POA: Diagnosis present

## 2023-05-30 DIAGNOSIS — E039 Hypothyroidism, unspecified: Secondary | ICD-10-CM | POA: Diagnosis present

## 2023-05-30 DIAGNOSIS — R911 Solitary pulmonary nodule: Secondary | ICD-10-CM | POA: Diagnosis present

## 2023-05-30 DIAGNOSIS — E871 Hypo-osmolality and hyponatremia: Secondary | ICD-10-CM | POA: Diagnosis present

## 2023-05-30 DIAGNOSIS — F419 Anxiety disorder, unspecified: Secondary | ICD-10-CM | POA: Diagnosis not present

## 2023-05-30 DIAGNOSIS — N401 Enlarged prostate with lower urinary tract symptoms: Secondary | ICD-10-CM | POA: Diagnosis not present

## 2023-05-30 DIAGNOSIS — F32A Depression, unspecified: Secondary | ICD-10-CM | POA: Diagnosis not present

## 2023-05-30 DIAGNOSIS — C3491 Malignant neoplasm of unspecified part of right bronchus or lung: Secondary | ICD-10-CM | POA: Diagnosis not present

## 2023-05-30 DIAGNOSIS — R509 Fever, unspecified: Secondary | ICD-10-CM | POA: Diagnosis present

## 2023-05-30 DIAGNOSIS — Z1152 Encounter for screening for COVID-19: Secondary | ICD-10-CM | POA: Diagnosis not present

## 2023-05-30 DIAGNOSIS — E1151 Type 2 diabetes mellitus with diabetic peripheral angiopathy without gangrene: Secondary | ICD-10-CM | POA: Diagnosis present

## 2023-05-30 DIAGNOSIS — R338 Other retention of urine: Secondary | ICD-10-CM | POA: Diagnosis not present

## 2023-05-30 DIAGNOSIS — A419 Sepsis, unspecified organism: Secondary | ICD-10-CM | POA: Diagnosis present

## 2023-05-30 DIAGNOSIS — I110 Hypertensive heart disease with heart failure: Secondary | ICD-10-CM | POA: Diagnosis present

## 2023-05-30 DIAGNOSIS — D72829 Elevated white blood cell count, unspecified: Secondary | ICD-10-CM | POA: Diagnosis present

## 2023-05-30 DIAGNOSIS — D62 Acute posthemorrhagic anemia: Secondary | ICD-10-CM | POA: Diagnosis not present

## 2023-05-30 DIAGNOSIS — D638 Anemia in other chronic diseases classified elsewhere: Secondary | ICD-10-CM | POA: Diagnosis present

## 2023-05-30 DIAGNOSIS — J9611 Chronic respiratory failure with hypoxia: Secondary | ICD-10-CM | POA: Diagnosis present

## 2023-05-30 LAB — TROPONIN I (HIGH SENSITIVITY): Troponin I (High Sensitivity): 21 ng/L — ABNORMAL HIGH (ref <18)

## 2023-05-30 LAB — URINALYSIS, W/ REFLEX TO CULTURE (INFECTION SUSPECTED)
Bacteria, UA: NONE SEEN
Bilirubin Urine: NEGATIVE
Glucose, UA: NEGATIVE mg/dL
Hgb urine dipstick: NEGATIVE
Ketones, ur: NEGATIVE mg/dL
Leukocytes,Ua: NEGATIVE
Nitrite: NEGATIVE
Protein, ur: NEGATIVE mg/dL
Specific Gravity, Urine: 1.009 (ref 1.005–1.030)
pH: 5 (ref 5.0–8.0)

## 2023-05-30 LAB — GLUCOSE, CAPILLARY
Glucose-Capillary: 113 mg/dL — ABNORMAL HIGH (ref 70–99)
Glucose-Capillary: 106 mg/dL — ABNORMAL HIGH (ref 70–99)

## 2023-05-30 LAB — CBC
HCT: 40.3 % (ref 39.0–52.0)
Hemoglobin: 12.8 g/dL — ABNORMAL LOW (ref 13.0–17.0)
MCH: 31.5 pg (ref 26.0–34.0)
MCHC: 31.8 g/dL (ref 30.0–36.0)
MCV: 99.3 fL (ref 80.0–100.0)
Platelets: 293 10*3/uL (ref 150–400)
RBC: 4.06 MIL/uL — ABNORMAL LOW (ref 4.22–5.81)
RDW: 15 % (ref 11.5–15.5)
WBC: 12.3 10*3/uL — ABNORMAL HIGH (ref 4.0–10.5)
nRBC: 0 % (ref 0.0–0.2)

## 2023-05-30 LAB — RESP PANEL BY RT-PCR (RSV, FLU A&B, COVID)  RVPGX2
Influenza A by PCR: NEGATIVE
Influenza B by PCR: NEGATIVE
Resp Syncytial Virus by PCR: NEGATIVE
SARS Coronavirus 2 by RT PCR: NEGATIVE

## 2023-05-30 LAB — I-STAT CG4 LACTIC ACID, ED: Lactic Acid, Venous: 1.2 mmol/L (ref 0.5–1.9)

## 2023-05-30 LAB — CBG MONITORING, ED: Glucose-Capillary: 114 mg/dL — ABNORMAL HIGH (ref 70–99)

## 2023-05-30 LAB — CREATININE, SERUM
Creatinine, Ser: 0.78 mg/dL (ref 0.61–1.24)
GFR, Estimated: >60 mL/min (ref >60)

## 2023-05-30 MED ORDER — UMECLIDINIUM BROMIDE 62.5 MCG/ACT IN AEPB
1.0000 | INHALATION_SPRAY | Freq: Every day | RESPIRATORY_TRACT | Status: DC
  Administered 2023-05-31: 1 via RESPIRATORY_TRACT
  Filled 2023-05-30: qty 7

## 2023-05-30 MED ORDER — ALLOPURINOL 300 MG PO TABS
300.0000 mg | ORAL_TABLET | Freq: Every day | ORAL | Status: DC
  Administered 2023-05-30 – 2023-05-31 (×2): 300 mg via ORAL
  Filled 2023-05-30: qty 1
  Filled 2023-05-30: qty 3

## 2023-05-30 MED ORDER — LEVOTHYROXINE SODIUM 50 MCG PO TABS
50.0000 ug | ORAL_TABLET | Freq: Every day | ORAL | Status: DC
  Administered 2023-05-30 – 2023-05-31 (×2): 50 ug via ORAL
  Filled 2023-05-30: qty 2
  Filled 2023-05-30: qty 1

## 2023-05-30 MED ORDER — ONDANSETRON HCL 4 MG/2ML IJ SOLN
4.0000 mg | Freq: Four times a day (QID) | INTRAMUSCULAR | Status: DC | PRN
  Administered 2023-05-30: 4 mg via INTRAVENOUS
  Filled 2023-05-30: qty 2

## 2023-05-30 MED ORDER — PANTOPRAZOLE SODIUM 40 MG PO TBEC
40.0000 mg | DELAYED_RELEASE_TABLET | Freq: Every day | ORAL | Status: DC
  Administered 2023-05-30 – 2023-05-31 (×2): 40 mg via ORAL
  Filled 2023-05-30 (×2): qty 1

## 2023-05-30 MED ORDER — INSULIN ASPART 100 UNIT/ML IJ SOLN
0.0000 [IU] | Freq: Three times a day (TID) | INTRAMUSCULAR | Status: DC
  Administered 2023-05-31: 2 [IU] via SUBCUTANEOUS

## 2023-05-30 MED ORDER — ALBUTEROL SULFATE (2.5 MG/3ML) 0.083% IN NEBU
2.5000 mg | INHALATION_SOLUTION | RESPIRATORY_TRACT | Status: DC | PRN
  Administered 2023-05-30: 2.5 mg via RESPIRATORY_TRACT
  Filled 2023-05-30: qty 3

## 2023-05-30 MED ORDER — INSULIN DEGLUDEC 200 UNIT/ML ~~LOC~~ SOPN
10.0000 [IU] | PEN_INJECTOR | Freq: Every evening | SUBCUTANEOUS | Status: DC
Start: 2023-05-30 — End: 2023-05-30

## 2023-05-30 MED ORDER — SODIUM CHLORIDE 0.9 % IV SOLN
1.0000 g | INTRAVENOUS | Status: DC
  Administered 2023-05-30 – 2023-05-31 (×2): 1 g via INTRAVENOUS
  Filled 2023-05-30 (×2): qty 10

## 2023-05-30 MED ORDER — FLUTICASONE FUROATE-VILANTEROL 100-25 MCG/ACT IN AEPB
1.0000 | INHALATION_SPRAY | Freq: Every day | RESPIRATORY_TRACT | Status: DC
  Administered 2023-05-31: 1 via RESPIRATORY_TRACT
  Filled 2023-05-30 (×2): qty 28

## 2023-05-30 MED ORDER — SODIUM CHLORIDE 0.9% FLUSH
3.0000 mL | Freq: Two times a day (BID) | INTRAVENOUS | Status: DC
Start: 2023-05-30 — End: 2023-05-31
  Administered 2023-05-30 – 2023-05-31 (×3): 3 mL via INTRAVENOUS

## 2023-05-30 MED ORDER — POLYETHYLENE GLYCOL 3350 17 G PO PACK: 17.0000 g | PACK | Freq: Every day | ORAL | Status: DC | PRN

## 2023-05-30 MED ORDER — ENOXAPARIN SODIUM 40 MG/0.4ML IJ SOSY
40.0000 mg | PREFILLED_SYRINGE | INTRAMUSCULAR | Status: DC
  Administered 2023-05-31: 40 mg via SUBCUTANEOUS
  Filled 2023-05-30: qty 0.4

## 2023-05-30 MED ORDER — GABAPENTIN 300 MG PO CAPS
300.0000 mg | ORAL_CAPSULE | Freq: Three times a day (TID) | ORAL | Status: DC
  Administered 2023-05-30 – 2023-05-31 (×4): 300 mg via ORAL
  Filled 2023-05-30 (×5): qty 1

## 2023-05-30 MED ORDER — INSULIN GLARGINE 100 UNIT/ML ~~LOC~~ SOLN
10.0000 [IU] | Freq: Every day | SUBCUTANEOUS | Status: DC
  Administered 2023-05-30: 10 [IU] via SUBCUTANEOUS
  Filled 2023-05-30 (×2): qty 0.1

## 2023-05-30 MED ORDER — AZITHROMYCIN 500 MG IV SOLR: 500.0000 mg | INTRAVENOUS | Status: DC

## 2023-05-30 MED ORDER — ACETAMINOPHEN 650 MG RE SUPP: 650.0000 mg | Freq: Four times a day (QID) | RECTAL | Status: DC | PRN

## 2023-05-30 MED ORDER — INSULIN ASPART 100 UNIT/ML IJ SOLN
0.0000 [IU] | Freq: Every day | INTRAMUSCULAR | Status: DC
Start: 2023-05-30 — End: 2023-05-31

## 2023-05-30 MED ORDER — ACETAMINOPHEN 325 MG PO TABS: 650.0000 mg | ORAL_TABLET | Freq: Four times a day (QID) | ORAL | Status: DC | PRN

## 2023-05-30 MED ORDER — ZOLPIDEM TARTRATE 5 MG PO TABS
5.0000 mg | ORAL_TABLET | Freq: Every evening | ORAL | Status: DC | PRN
  Administered 2023-05-30: 5 mg via ORAL
  Filled 2023-05-30: qty 1

## 2023-05-30 NOTE — ED Notes (Signed)
 Pt moved to chair as back and tailbone pain increasing in the chair. Pt also c/o mucus he can't get up so neb given.

## 2023-05-30 NOTE — ED Notes (Signed)
Pt sitting on side of bed eating .

## 2023-05-30 NOTE — ED Notes (Addendum)
 Pt having increasing back pain in the stretcher, he is very uncomfortable... secretary asked to order hospital bed

## 2023-05-30 NOTE — ED Provider Notes (Signed)
 [place] EMERGENCY DEPARTMENT AT [facility] Provider Note   CSN: [id number] Arrival date & time: 05/29/23  2100     History  Chief Complaint  Patient presents with   Fever   Shortness of Breath    John [name] is a 78 y.o. male.  With a history of COPD, hypertension, diabetes, heart failure and lung cancer presents to the ED for fever.  Fever cough congestion increased shortness of breath began at home earlier today.  EMS was called started the patient on nonrebreather and administered 1 g Rocephin IM.  He was noted to be hypotensive.  Was feeling well yesterday.  Denies chest pain nausea vomiting diarrhea abdominal pain.   Fever Shortness of Breath Associated symptoms: fever        Home Medications Prior to Admission medications   Medication Sig Start Date End Date Taking? Authorizing Provider  albuterol (VENTOLIN HFA) 108 (90 Base) MCG/ACT inhaler INHALE 2 PUFFS INTO THE LUNGS EVERY 6 HOURS AS NEEDED FOR WHEEZE OR SHORTNESS OF BREATH 01/19/23   Jannifer Rodney A, FNP  allopurinol (ZYLOPRIM) 300 MG tablet TAKE 1 TABLET BY MOUTH EVERY DAY 03/09/23   Jannifer Rodney A, FNP  Blood Glucose Monitoring Suppl (ONETOUCH VERIO REFLECT) w/Device KIT Use to test blood sugars daily as directed. DX: E11.9 10/14/19   Jannifer Rodney A, FNP  clopidogrel (PLAVIX) 75 MG tablet Take 1 tablet (75 mg total) by mouth daily. 02/05/23 02/05/24  Cephus Shelling, MD  Continuous Glucose Receiver (FREESTYLE LIBRE 3 READER) DEVI 1 Device by Does not apply route every 14 (fourteen) days. 08/11/22   Junie Spencer, FNP  Continuous Glucose Sensor (FREESTYLE LIBRE 3 SENSOR) MISC 1 Device by Does not apply route continuous. 08/11/22   Jannifer Rodney A, FNP  docusate sodium (COLACE) 100 MG capsule Take 1 capsule (100 mg total) by mouth 2 (two) times daily. 02/19/23 02/19/24  Sherryll Burger, Pratik D, DO  dutasteride (AVODART) 0.5 MG capsule Take 1 capsule (0.5 mg total) by mouth daily. 10/31/22   McKenzie,  Mardene Celeste, MD  famotidine (PEPCID) 20 MG tablet Take 1 tablet (20 mg total) by mouth daily. 02/19/23 04/14/23  Sherryll Burger, Pratik D, DO  ferrous sulfate 325 (65 FE) MG EC tablet Take 1 tablet (325 mg total) by mouth 2 (two) times daily. Patient taking differently: Take 325 mg by mouth daily. 02/19/23 02/19/24  Sherryll Burger, Pratik D, DO  Fluticasone-Umeclidin-Vilant (TRELEGY ELLIPTA) 100-62.5-25 MCG/ACT AEPB Inhale 1 puff into the lungs daily. 12/26/22   Junie Spencer, FNP  gabapentin (NEURONTIN) 300 MG capsule Take 1 capsule (300 mg total) by mouth 3 (three) times daily. 04/30/23   Jannifer Rodney A, FNP  insulin degludec (TRESIBA FLEXTOUCH) 200 UNIT/ML FlexTouch Pen Inject 10 Units into the skin every evening. 05/01/23   Jannifer Rodney A, FNP  Insulin Pen Needle (BD PEN NEEDLE NANO U/F) 32G X 4 MM MISC Use to inject insulin once daily 12/29/22   Dani Gobble, NP  levothyroxine (SYNTHROID) 50 MCG tablet Take 1 tablet (50 mcg total) by mouth daily. 04/02/23   Dani Gobble, NP  linaclotide Karlene Einstein) 72 MCG capsule Take 1 capsule (72 mcg total) by mouth daily before breakfast. 04/30/23   Jannifer Rodney A, FNP  metolazone (ZAROXOLYN) 2.5 MG tablet Take 2.5 mg by mouth 3 (three) times a week. 03/13/23   [provider]  NON FORMULARY CPAP at bedtime    [provider]  ondansetron (ZOFRAN) 4 MG tablet Take 1 tablet (  4 mg total) by mouth every 8 (eight) hours as needed for nausea or vomiting. 04/30/23   Junie Spencer, FNP  OneTouch Delica Lancets 30G MISC Use to test blood sugars daily as directed. DX: E11.9 02/24/22   Dani Gobble, NP  OXYGEN Inhale 2 L into the lungs daily as needed (with Cpap at night).    [provider]  pantoprazole (PROTONIX) 40 MG tablet Take 1 tablet (40 mg total) by mouth daily. 04/30/23   Jannifer Rodney A, FNP  pravastatin (PRAVACHOL) 40 MG tablet TAKE 1 TABLET (40 MG TOTAL) BY MOUTH ON MONDAYS , WEDNESDAYS, AND FRIDAYS AS DIRECTED 04/30/23   Jannifer Rodney A, FNP  propranolol (INDERAL) 10 MG tablet Take 1 tablet (10 mg total) by mouth 3 (three) times daily as needed. 04/27/23   Jodelle Gross, NP  spironolactone (ALDACTONE) 25 MG tablet Take 0.5 tablets (12.5 mg total) by mouth daily. 12/30/22 03/30/23  Jodelle Gross, NP  torsemide (DEMADEX) 20 MG tablet Take 1 tablet (20 mg total) by mouth daily. Patient taking differently: Take 20 mg by mouth once. Pt says he takes 1/2 tablet 03/03/22   Jannifer Rodney A, FNP  torsemide (DEMADEX) 20 MG tablet Take 20 mg by mouth daily. Take 1 Tablet Daily    [provider]  zolpidem (AMBIEN) 5 MG tablet Take 1 tablet (5 mg total) by mouth at bedtime as needed for sleep. 04/30/23   Junie Spencer, FNP      Allergies    Bisoprolol, Sulfa antibiotics, Atorvastatin, Jardiance [empagliflozin], Lopressor [metoprolol], Rosuvastatin, Doxycycline, Tramadol, and Temazepam    Review of Systems   Review of Systems  Constitutional:  Positive for fever.  Respiratory:  Positive for shortness of breath.     Physical Exam Updated Vital Signs BP (!) 105/57   Pulse (!) 119   Temp 97.7 F (36.5 C) (Oral)   Resp 18   Ht 5\' 6"  (1.676 m)   Wt 69.4 kg   SpO2 98%   BMI 24.69 kg/m  Physical Exam Vitals and nursing note reviewed.  HENT:     Head: Normocephalic and atraumatic.  Eyes:     Pupils: Pupils are equal, round, and reactive to light.  Cardiovascular:     Rate and Rhythm: Normal rate and regular rhythm.  Pulmonary:     Effort: Pulmonary effort is normal.     Breath sounds: Examination of the right-upper field reveals rhonchi. Examination of the left-upper field reveals rhonchi. Rhonchi present.  Abdominal:     Palpations: Abdomen is soft.     Tenderness: There is no abdominal tenderness.  Skin:    General: Skin is warm and dry.  Neurological:     Mental Status: He is alert.  Psychiatric:        Mood and Affect: Mood normal.     ED Results / Procedures / Treatments    Labs (all labs ordered are listed, but only abnormal results are displayed) Labs Reviewed  COMPREHENSIVE METABOLIC PANEL - Abnormal; Notable for the following components:      Result Value   Sodium 133 (*)    Chloride 96 (*)    Glucose, Bld 157 (*)    Albumin 3.2 (*)    All other components within normal limits  CBC WITH DIFFERENTIAL/PLATELET - Abnormal; Notable for the following components:   WBC 17.2 (*)    Neutro Abs 15.7 (*)    Lymphs Abs 0.4 (*)    Abs Immature Granulocytes  0.10 (*)    All other components within normal limits  RESP PANEL BY RT-PCR (RSV, FLU A&B, COVID)  [id number]  CULTURE, BLOOD (ROUTINE X 2)  CULTURE, BLOOD (ROUTINE X 2)  PROTIME-INR  APTT  URINALYSIS, W/ REFLEX TO CULTURE (INFECTION SUSPECTED)  I-STAT CG4 LACTIC ACID, ED  I-STAT CG4 LACTIC ACID, ED    EKG None  Radiology DG Chest Port 1 View Result Date: 05/29/2023 CLINICAL DATA:  Fever, shortness of breath EXAM: PORTABLE CHEST 1 VIEW COMPARISON:  02/17/2023.  CT 03/24/2023 FINDINGS: Left Port-A-Cath remains in place, unchanged. Heart and mediastinal contours within normal limits. Aortic atherosclerosis. There is hyperinflation of the lungs compatible with COPD. Nodular densities in the left upper lobe are similar to prior CT. Previously seen lower lobe nodular densities not as well visualized by plain film. No definite acute consolidation or effusion. IMPRESSION: Nodular densities in the left upper lobe/apex are similar to prior CT. COPD. No acute cardiopulmonary disease. Electronically Signed   By: Charlett Nose M.D.   On: 05/29/2023 22:19    Procedures Procedures    Medications Ordered in ED Medications  lactated ringers infusion ( Intravenous New Bag/Given 05/29/23 2256)  sodium chloride 0.9 % bolus 1,000 mL (0 mLs Intravenous Stopped 05/29/23 2358)    And  sodium chloride 0.9 % bolus 1,000 mL (1,000 mLs Intravenous New Bag/Given 05/29/23 2255)    And  sodium chloride 0.9 % bolus 250 mL (250 mLs  Intravenous New Bag/Given 05/30/23 0004)  azithromycin (ZITHROMAX) 500 mg in sodium chloride 0.9 % 250 mL IVPB (has no administration in time range)    ED Course/ Medical Decision Making/ A&P Clinical Course as of 05/30/23 0006  Fri May 29, 2023  2330 Chest x-ray shows no focal consolidation.  Leukocytosis of 17.  No other significant metabolic abnormalities on CMP.  Patient has remained hemodynamically stable up to this point.  Chest x-ray shows no focal consolidation  I, Estelle June DO, am transitioning care of this patient to the oncoming provider pending UA, viral respiratory swab, reevaluation and disposition [MP]    Clinical Course User Index [MP] Royanne Foots, DO                                 Medical Decision Making 78 year old male with history as above presenting for cough congestion fever all starting today.  Meeting SIRS criteria based on blood pressure with EMS and heart rate here.  Sepsis workup initiated.  Will provide IV fluid bolus.  He did receive Rocephin prior to arrival but will add on azithromycin for presumed respiratory source.  Will obtain infectious workup including blood cultures lactate UA labs chest x-ray and viral respiratory swab  Amount and/or Complexity of Data Reviewed Labs: ordered. Radiology: ordered.  Risk Prescription drug management.           Final Clinical Impression(s) / ED Diagnoses Final diagnoses:  Shortness of breath    Rx / DC Orders ED Discharge Orders     None         Royanne Foots, DO 05/30/23 0006

## 2023-05-30 NOTE — H&P (Signed)
 History and Physical    Patient: John [name] WGN:[id number] DOB: [birth year] DOA: 05/29/2023 DOS: the patient was seen and examined on 05/30/2023 PCP: Junie Spencer, FNP  Patient coming from: Home  Chief Complaint:  Chief Complaint  Patient presents with   Fever   Shortness of Breath   HPI: John [name] is a 78 y.o. male with medical history significant of adenocarcinoma of the lung.  This is actually believed to be in remission.  History is obtained principally from the patient for this encounter, corroborated from the medical record.  Patient seems to have been in his usual state of health till yesterday.  Patient recalls having had fever around the time of the afternoon and states that his wife found him "talking over my head "later in the evening.  Patient was brought to [facility], ER.  Per report obtained patient was febrile and respiratory distress and somewhat lethargic.  Patient has been treated with IM Rocephin by EMS , had a temp of 101.8 per EMS.  Patient's white count was 17.2 lactate was normal.  COVID flu RSV were negative.  Chest x-ray negative UA unremarkable.  Medical evaluation was sought.  Patient denies having any cough chest pain current shortness of breath any diarrhea vomiting rash on his skin ear pain sore throat etc.  He denies dysuria.  Patient states that he is actually feeling pretty good right now.  He is actually totally asymptomatic.  And he demonstrated good insight to the events of yesterday which makes me think that he is not encephalopathic anymore.  Patient is able to give me good history in terms of his status of lung cancer etc. No marked headaceh. No photo phobia.  Patient has been feeling nausea this morning, however states that he is chronically nauseous and uses Zofran at home.  Review of Systems: As mentioned in the history of present illness. All other systems reviewed and are negative. Past Medical History:  Diagnosis Date    Adenocarcinoma of lung, right (HCC) 04/18/2016   Anemia    Anxiety    Arthritis    Asthma    Atrial flutter (HCC)    BPH (benign prostatic hyperplasia)    with urinary retention 02/06/20   CHF (congestive heart failure) (HCC)    COPD (chronic obstructive pulmonary disease) (HCC)    Depression    Diabetes mellitus, type II (HCC)    Dyspnea    History of kidney stones    History of radiation therapy    right lung 09/10/2020-09/17/2020   Dr Roselind Messier   History of radiation therapy    Left and right Lung- 06/19/22-06/26/22   Hyperlipidemia    Hypertension    Hypothyroidism    Macular degeneration    Neuropathy    Non-small cell lung cancer, right (HCC) 04/18/2016   Peripheral vascular disease (HCC)    Prostatitis    Pulmonary nodule, left 07/16/2016   Sleep apnea    cpap   Past Surgical History:  Procedure Laterality Date   A-FLUTTER ABLATION N/A 01/13/2022   Procedure: A-FLUTTER ABLATION;  Surgeon: Lanier Prude, MD;  [facility]: [facility];  Service: Cardiovascular;  Laterality: N/A;   ABDOMINAL AORTOGRAM W/LOWER EXTREMITY Left 02/06/2020   Procedure: ABDOMINAL AORTOGRAM W/LOWER EXTREMITY;  Surgeon: Runell Gess, MD;  Location: [facility];  Service: Cardiovascular;  Laterality: Left;   ABDOMINAL AORTOGRAM W/LOWER EXTREMITY N/A 05/30/2021   Procedure: ABDOMINAL AORTOGRAM W/LOWER EXTREMITY;  Surgeon: Cephus Shelling, MD;  Location: [facility];  Service: Cardiovascular;  Laterality: N/A;   ABDOMINAL AORTOGRAM W/LOWER EXTREMITY N/A 02/05/2023   Procedure: ABDOMINAL AORTOGRAM W/LOWER EXTREMITY;  Surgeon: Cephus Shelling, MD;  [facility]: [facility];  Service: Cardiovascular;  Laterality: N/A;   BIOPSY  09/16/2021   Procedure: BIOPSY;  Surgeon: Iva Boop, MD;  Location: [facility] ENDOSCOPY;  Service: Gastroenterology;;   CARDIOVERSION N/A 10/05/2020   Procedure: CARDIOVERSION;  Surgeon: Sande Rives, MD;  Location: [facility] ENDOSCOPY;  Service:  Cardiovascular;  Laterality: N/A;   CATARACT EXTRACTION, BILATERAL Bilateral    COLONOSCOPY WITH PROPOFOL N/A 06/20/2021   Procedure: COLONOSCOPY WITH PROPOFOL;  Surgeon: Hilarie Fredrickson, MD;  Location: [facility] ENDOSCOPY;  Service: Endoscopy;  Laterality: N/A;   COLONOSCOPY WITH PROPOFOL N/A 09/16/2021   Procedure: COLONOSCOPY WITH PROPOFOL;  Surgeon: Iva Boop, MD;  Location: [facility] ENDOSCOPY;  Service: Gastroenterology;  Laterality: N/A;   ENDARTERECTOMY FEMORAL Right 08/20/2020   Procedure: ENDARTERECTOMY  RIGHT FEMORAL ARTERY;  Surgeon: Cephus Shelling, MD;  Location: [facility] OR;  Service: Vascular;  Laterality: Right;   ENTEROSCOPY N/A 10/11/2021   Procedure: ENTEROSCOPY;  Surgeon: Dolores Frame, MD;  Location: [facility] ENDO SUITE;  Service: Gastroenterology;  Laterality: N/A;   ENTEROSCOPY N/A 02/18/2023   Procedure: ENTEROSCOPY;  Surgeon: Corbin Ade, MD;  Location: [facility] ENDO [facility];  Service: Endoscopy;  Laterality: N/A;  EGD with possible small bowel push enteroscopy   ESOPHAGOGASTRODUODENOSCOPY N/A 09/16/2021   Procedure: ESOPHAGOGASTRODUODENOSCOPY (EGD);  Surgeon: Iva Boop, MD;  Location: [facility] ENDOSCOPY;  Service: Gastroenterology;  Laterality: N/A;   ESOPHAGOGASTRODUODENOSCOPY (EGD) WITH PROPOFOL N/A 09/06/2020   Procedure: ESOPHAGOGASTRODUODENOSCOPY (EGD) WITH PROPOFOL;  Surgeon: Hilarie Fredrickson, MD;  Location: [facility] ENDOSCOPY;  Service: Endoscopy;  Laterality: N/A;   ESOPHAGOGASTRODUODENOSCOPY (EGD) WITH PROPOFOL N/A 10/11/2021   Procedure: ESOPHAGOGASTRODUODENOSCOPY (EGD) WITH PROPOFOL;  Surgeon: Dolores Frame, MD;  [facility]: [facility];  Service: Gastroenterology;  Laterality: N/A;   ESOPHAGOGASTRODUODENOSCOPY (EGD) WITH PROPOFOL N/A 02/18/2023   Procedure: ESOPHAGOGASTRODUODENOSCOPY (EGD) WITH PROPOFOL;  Surgeon: Corbin Ade, MD;  Location: [facility];  Service: Endoscopy;  Laterality: N/A;   GIVENS CAPSULE STUDY N/A 02/19/2023   Procedure: GIVENS CAPSULE STUDY;   Surgeon: Franky Macho, MD;  Location: [facility];  Service: Endoscopy;  Laterality: N/A;   HEMOSTASIS CLIP PLACEMENT  09/16/2021   Procedure: HEMOSTASIS CLIP PLACEMENT;  Surgeon: Iva Boop, MD;  Location: [facility] ENDOSCOPY;  Service: Gastroenterology;;   HOT HEMOSTASIS N/A 09/16/2021   Procedure: HOT HEMOSTASIS (ARGON PLASMA COAGULATION/BICAP);  Surgeon: Iva Boop, MD;  Location: [facility] ENDOSCOPY;  Service: Gastroenterology;  Laterality: N/A;   HOT HEMOSTASIS  10/11/2021   Procedure: HOT HEMOSTASIS (ARGON PLASMA COAGULATION/BICAP);  Surgeon: Marguerita Merles, Reuel Boom, MD;  Location: [facility];  Service: Gastroenterology;;   HOT HEMOSTASIS  02/18/2023   Procedure: HOT HEMOSTASIS (ARGON PLASMA COAGULATION/BICAP);  Surgeon: Corbin Ade, MD;  [facility]: [facility];  Service: Endoscopy;;   INSERTION OF ILIAC STENT Right 08/20/2020   Procedure: RETROGRADE INSERTION OF RIGHT ILIAC STENT;  Surgeon: Cephus Shelling, MD;  Location: [facility] OR;  Service: Vascular;  Laterality: Right;   INTRAOPERATIVE ARTERIOGRAM Right 08/20/2020   Procedure: INTRA OPERATIVE ARTERIOGRAM ILIAC;  Surgeon: Cephus Shelling, MD;  Location: [facility] OR;  Service: Vascular;  Laterality: Right;   PATCH ANGIOPLASTY Right 08/20/2020   Procedure: PATCH ANGIOPLASTY RIGHT FEMORAL ARTERY;  Surgeon: Cephus Shelling, MD;  Location: [facility] OR;  Service: Vascular;  Laterality: Right;   PERIPHERAL VASCULAR BALLOON ANGIOPLASTY Right 05/30/2021   Procedure: PERIPHERAL VASCULAR BALLOON ANGIOPLASTY;  Surgeon: Cephus Shelling, MD;  [facility];  Service: Cardiovascular;  Laterality: Right;   PERIPHERAL VASCULAR INTERVENTION Right 02/05/2023   Procedure: PERIPHERAL VASCULAR INTERVENTION;  Surgeon: Cephus Shelling, MD;  [facility];  Service: Cardiovascular;  Laterality: Right;  Rt SFA and Rt Ext Iliac   PORTACATH PLACEMENT Left 06/13/2016   Procedure: INSERTION PORT-A-CATH;  Surgeon: Franky Macho, MD;  Location: [facility] ORS;  Service: General;  Laterality: Left;   TRANSURETHRAL RESECTION OF PROSTATE N/A 05/31/2020   Procedure: TRANSURETHRAL RESECTION OF THE PROSTATE (TURP);  Surgeon: Malen Gauze, MD;  Location: [facility] ORS;  Service: Urology;  Laterality: N/A;   VIDEO BRONCHOSCOPY WITH ENDOBRONCHIAL NAVIGATION N/A 05/28/2016   Procedure: VIDEO BRONCHOSCOPY WITH ENDOBRONCHIAL NAVIGATION;  Surgeon: Loreli Slot, MD;  Location: [facility] OR;  Service: Thoracic;  Laterality: N/A;   VIDEO BRONCHOSCOPY WITH ENDOBRONCHIAL ULTRASOUND N/A 05/28/2016   Procedure: VIDEO BRONCHOSCOPY WITH ENDOBRONCHIAL ULTRASOUND;  Surgeon: Loreli Slot, MD;  Location: [facility] OR;  Service: Thoracic;  Laterality: N/A;   Social History:  reports that he has been smoking cigarettes. He started smoking about 62 years ago. He has a 31.1 pack-year smoking history. He has never used smokeless tobacco. He reports that he does not drink alcohol and does not use drugs.  Allergies  Allergen Reactions   Bisoprolol Other (See Comments)    Confusion, delirium    Sulfa Antibiotics Swelling    Mouth swelling   Atorvastatin Other (See Comments)    Muscle aches - tolerating Pravastatin 40 mg MWF   Jardiance [Empagliflozin] Other (See Comments)    FEELS SLUGGISH, TIRED   Lopressor [Metoprolol] Other (See Comments)    Fatigue   Rosuvastatin Other (See Comments)    Muscle aches - tolerating Pravastatin 40 mg MWF   Doxycycline Nausea Only   Tramadol Itching   Temazepam Other (See Comments)    Made insomnia worse     Family History  Problem Relation Age of Onset   Hypertension Mother    Diabetes Father    Heart disease Father    Stroke Father    Hypertension Sister    Sleep apnea Neg Hx     Prior to Admission medications   Medication Sig Start Date End Date Taking? Authorizing Provider  albuterol (VENTOLIN HFA) 108 (90 Base) MCG/ACT inhaler INHALE 2 PUFFS INTO THE LUNGS EVERY 6 HOURS AS NEEDED FOR WHEEZE OR  SHORTNESS OF BREATH 01/19/23   Jannifer Rodney A, FNP  allopurinol (ZYLOPRIM) 300 MG tablet TAKE 1 TABLET BY MOUTH EVERY DAY 03/09/23   Jannifer Rodney A, FNP  Blood Glucose Monitoring Suppl (ONETOUCH VERIO REFLECT) w/Device KIT Use to test blood sugars daily as directed. DX: E11.9 10/14/19   Jannifer Rodney A, FNP  clopidogrel (PLAVIX) 75 MG tablet Take 1 tablet (75 mg total) by mouth daily. 02/05/23 02/05/24  Cephus Shelling, MD  Continuous Glucose Receiver (FREESTYLE LIBRE 3 READER) DEVI 1 Device by Does not apply route every 14 (fourteen) days. 08/11/22   Junie Spencer, FNP  Continuous Glucose Sensor (FREESTYLE LIBRE 3 SENSOR) MISC 1 Device by Does not apply route continuous. 08/11/22   Jannifer Rodney A, FNP  docusate sodium (COLACE) 100 MG capsule Take 1 capsule (100 mg total) by mouth 2 (two) times daily. 02/19/23 02/19/24  Sherryll Burger, Pratik D, DO  dutasteride (AVODART) 0.5 MG capsule Take  1 capsule (0.5 mg total) by mouth daily. 10/31/22   McKenzie, Mardene Celeste, MD  famotidine (PEPCID) 20 MG tablet Take 1 tablet (20 mg total) by mouth daily. 02/19/23 04/14/23  Sherryll Burger, Pratik D, DO  ferrous sulfate 325 (65 FE) MG EC tablet Take 1 tablet (325 mg total) by mouth 2 (two) times daily. Patient taking differently: Take 325 mg by mouth daily. 02/19/23 02/19/24  Sherryll Burger, Pratik D, DO  Fluticasone-Umeclidin-Vilant (TRELEGY ELLIPTA) 100-62.5-25 MCG/ACT AEPB Inhale 1 puff into the lungs daily. 12/26/22   Junie Spencer, FNP  gabapentin (NEURONTIN) 300 MG capsule Take 1 capsule (300 mg total) by mouth 3 (three) times daily. 04/30/23   Jannifer Rodney A, FNP  insulin degludec (TRESIBA FLEXTOUCH) 200 UNIT/ML FlexTouch Pen Inject 10 Units into the skin every evening. 05/01/23   Jannifer Rodney A, FNP  Insulin Pen Needle (BD PEN NEEDLE NANO U/F) 32G X 4 MM MISC Use to inject insulin once daily 12/29/22   Dani Gobble, NP  levothyroxine (SYNTHROID) 50 MCG tablet Take 1 tablet (50 mcg total) by mouth daily. 04/02/23   Dani Gobble, NP  linaclotide Karlene Einstein) 72 MCG capsule Take 1 capsule (72 mcg total) by mouth daily before breakfast. 04/30/23   Jannifer Rodney A, FNP  metolazone (ZAROXOLYN) 2.5 MG tablet Take 2.5 mg by mouth 3 (three) times a week. 03/13/23   [provider]  NON FORMULARY CPAP at bedtime    [provider]  ondansetron (ZOFRAN) 4 MG tablet Take 1 tablet (4 mg total) by mouth every 8 (eight) hours as needed for nausea or vomiting. 04/30/23   Junie Spencer, FNP  OneTouch Delica Lancets 30G MISC Use to test blood sugars daily as directed. DX: E11.9 02/24/22   Dani Gobble, NP  OXYGEN Inhale 2 L into the lungs daily as needed (with Cpap at night).    [provider]  pantoprazole (PROTONIX) 40 MG tablet Take 1 tablet (40 mg total) by mouth daily. 04/30/23   Jannifer Rodney A, FNP  pravastatin (PRAVACHOL) 40 MG tablet TAKE 1 TABLET (40 MG TOTAL) BY MOUTH ON MONDAYS , WEDNESDAYS, AND FRIDAYS AS DIRECTED 04/30/23   Jannifer Rodney A, FNP  propranolol (INDERAL) 10 MG tablet Take 1 tablet (10 mg total) by mouth 3 (three) times daily as needed. 04/27/23   Jodelle Gross, NP  spironolactone (ALDACTONE) 25 MG tablet Take 0.5 tablets (12.5 mg total) by mouth daily. 12/30/22 03/30/23  Jodelle Gross, NP  torsemide (DEMADEX) 20 MG tablet Take 1 tablet (20 mg total) by mouth daily. Patient taking differently: Take 20 mg by mouth once. Pt says he takes 1/2 tablet 03/03/22   Jannifer Rodney A, FNP  torsemide (DEMADEX) 20 MG tablet Take 20 mg by mouth daily. Take 1 Tablet Daily    [provider]  zolpidem (AMBIEN) 5 MG tablet Take 1 tablet (5 mg total) by mouth at bedtime as needed for sleep. 04/30/23   Junie Spencer, FNP    Physical Exam: Vitals:   05/30/23 0700 05/30/23 0745 05/30/23 0845 05/30/23 0953  BP: 107/63 114/61 113/61   Pulse: 99 100 (!) 102   Resp: 18 18 16    Temp:    97.9 F (36.6 C)  TempSrc:    Oral  SpO2: 100% 100% 100%   Weight:      Height:        General: Patient is alert and awake, gives a fully coherent account of his symptoms yesterday.  Appears to be in no distress. Respiratory exam: Bilateral intravesicular Cardiovascular exam S1-S2 normal Abdomen all quadrants are soft nontender Extremities warm without any edema or rash. No nuchal rigidity Data Reviewed:  Labs on Admission:  Results for orders placed or performed during the hospital encounter of 05/29/23 (from the past 24 hours)  Blood Culture (routine x 2)     Status: None (Preliminary result)   Collection Time: 05/29/23  9:17 PM   Specimen: BLOOD LEFT HAND  Result Value Ref Range   Specimen Description BLOOD LEFT HAND    Special Requests      BOTTLES DRAWN AEROBIC AND ANAEROBIC Blood Culture adequate volume   Culture      NO GROWTH < 12 HOURS Performed at [facility], [address], [place], [place] [address]    Report Status PENDING   Comprehensive metabolic panel     Status: Abnormal   Collection Time: 05/29/23  9:23 PM  Result Value Ref Range   Sodium 133 (L) 135 - 145 mmol/L   Potassium 4.1 3.5 - 5.1 mmol/L   Chloride 96 (L) 98 - 111 mmol/L   CO2 24 22 - 32 mmol/L   Glucose, Bld 157 (H) 70 - 99 mg/dL   BUN 20 8 - 23 mg/dL   Creatinine, Ser 6.04 0.61 - 1.24 mg/dL   Calcium 9.1 8.9 - 54.0 mg/dL   Total Protein 7.2 6.5 - 8.1 g/dL   Albumin 3.2 (L) 3.5 - 5.0 g/dL   AST 22 15 - 41 U/L   ALT 20 0 - 44 U/L   Alkaline Phosphatase 125 38 - 126 U/L   Total Bilirubin 0.5 0.0 - 1.2 mg/dL   GFR, Estimated >98 >11 mL/min   Anion gap 13 5 - 15  CBC with Differential     Status: Abnormal   Collection Time: 05/29/23  9:23 PM  Result Value Ref Range   WBC 17.2 (H) 4.0 - 10.5 K/uL   RBC 4.44 4.22 - 5.81 MIL/uL   Hemoglobin 14.2 13.0 - 17.0 g/dL   HCT 91.4 78.2 - 95.6 %   MCV 98.0 80.0 - 100.0 fL   MCH 32.0 26.0 - 34.0 pg   MCHC 32.6 30.0 - 36.0 g/dL   RDW 21.3 08.6 - 57.8 %   Platelets 364 150 - 400 K/uL   nRBC 0.0 0.0 - 0.2 %   Neutrophils  Relative % 90 %   Neutro Abs 15.7 (H) 1.7 - 7.7 K/uL   Lymphocytes Relative 2 %   Lymphs Abs 0.4 (L) 0.7 - 4.0 K/uL   Monocytes Relative 6 %   Monocytes Absolute 1.0 0.1 - 1.0 K/uL   Eosinophils Relative 0 %   Eosinophils Absolute 0.1 0.0 - 0.5 K/uL   Basophils Relative 1 %   Basophils Absolute 0.1 0.0 - 0.1 K/uL   Immature Granulocytes 1 %   Abs Immature Granulocytes 0.10 (H) 0.00 - 0.07 K/uL  Protime-INR     Status: None   Collection Time: 05/29/23  9:23 PM  Result Value Ref Range   Prothrombin Time 13.8 11.4 - 15.2 seconds   INR 1.0 0.8 - 1.2  APTT     Status: None   Collection Time: 05/29/23  9:23 PM  Result Value Ref Range   aPTT 32 24 - 36 seconds  Blood Culture (routine x 2)     Status: None (Preliminary result)   Collection Time: 05/29/23  9:53 PM   Specimen: BLOOD  Result Value  Ref Range   Specimen Description BLOOD BLOOD RIGHT HAND    Special Requests      BOTTLES DRAWN AEROBIC AND ANAEROBIC Blood Culture adequate volume   Culture      NO GROWTH < 12 HOURS Performed at [facility], [address], [place], [place] [address]    Report Status PENDING   I-Stat Lactic Acid, ED     Status: None   Collection Time: 05/29/23 10:21 PM  Result Value Ref Range   Lactic Acid, Venous 1.3 0.5 - 1.9 mmol/L  Resp panel by RT-PCR (RSV, Flu A&B, Covid) Anterior Nasal Swab     Status: None   Collection Time: 05/30/23 12:00 AM   Specimen: Anterior Nasal Swab  Result Value Ref Range   SARS Coronavirus 2 by RT PCR NEGATIVE NEGATIVE   Influenza A by PCR NEGATIVE NEGATIVE   Influenza B by PCR NEGATIVE NEGATIVE   Resp Syncytial Virus by PCR NEGATIVE NEGATIVE  I-Stat Lactic Acid, ED     Status: None   Collection Time: 05/30/23 12:18 AM  Result Value Ref Range   Lactic Acid, Venous 1.2 0.5 - 1.9 mmol/L  Urinalysis, w/ Reflex to Culture (Infection Suspected) -Urine, Clean Catch     Status: None   Collection Time: 05/30/23  1:24 AM  Result Value Ref Range   Specimen Source  URINE, CLEAN CATCH    Color, Urine YELLOW YELLOW   APPearance CLEAR CLEAR   Specific Gravity, Urine 1.009 1.005 - 1.030   pH 5.0 5.0 - 8.0   Glucose, UA NEGATIVE NEGATIVE mg/dL   Hgb urine dipstick NEGATIVE NEGATIVE   Bilirubin Urine NEGATIVE NEGATIVE   Ketones, ur NEGATIVE NEGATIVE mg/dL   Protein, ur NEGATIVE NEGATIVE mg/dL   Nitrite NEGATIVE NEGATIVE   Leukocytes,Ua NEGATIVE NEGATIVE   RBC / HPF 0-5 0 - 5 RBC/hpf   WBC, UA 0-5 0 - 5 WBC/hpf   Bacteria, UA NONE SEEN NONE SEEN   Squamous Epithelial / HPF 0-5 0 - 5 /HPF   Mucus PRESENT    Hyaline Casts, UA PRESENT    *Note: Due to a large number of results and/or encounters for the requested time period, some results have not been displayed. A complete set of results can be found in Results Review.   Basic Metabolic Panel: Recent Labs  Lab 05/29/23 2123  NA 133*  K 4.1  CL 96*  CO2 24  GLUCOSE 157*  BUN 20  CREATININE 1.21  CALCIUM 9.1   Liver Function Tests: Recent Labs  Lab 05/29/23 2123  AST 22  ALT 20  ALKPHOS 125  BILITOT 0.5  PROT 7.2  ALBUMIN 3.2*   No results for input(s): "LIPASE", "AMYLASE" in the last 168 hours. No results for input(s): "AMMONIA" in the last 168 hours. CBC: Recent Labs  Lab 05/29/23 2123  WBC 17.2*  NEUTROABS 15.7*  HGB 14.2  HCT 43.5  MCV 98.0  PLT 364   Cardiac Enzymes: No results for input(s): "CKTOTAL", "CKMB", "CKMBINDEX", "TROPONINIHS" in the last 168 hours.  BNP (last 3 results) No results for input(s): "PROBNP" in the last 8760 hours. CBG: No results for input(s): "GLUCAP" in the last 168 hours.  Radiological Exams on Admission:  DG Chest Port 1 View Result Date: 05/29/2023 CLINICAL DATA:  Fever, shortness of breath EXAM: PORTABLE CHEST 1 VIEW COMPARISON:  02/17/2023.  CT 03/24/2023 FINDINGS: Left Port-A-Cath remains in place, unchanged. Heart and mediastinal contours within normal limits. Aortic atherosclerosis. There is hyperinflation of  the lungs compatible  with COPD. Nodular densities in the left upper lobe are similar to prior CT. Previously seen lower lobe nodular densities not as well visualized by plain film. No definite acute consolidation or effusion. IMPRESSION: Nodular densities in the left upper lobe/apex are similar to prior CT. COPD. No acute cardiopulmonary disease. Electronically Signed   By: Charlett Nose M.D.   On: 05/29/2023 22:19     I/O last 3 completed shifts: In: 2504.5 [IV Piggyback:2504.5] Out: 500 [Urine:500] No intake/output data recorded.       Assessment and Plan: * SIRS (systemic inflammatory response syndrome) (HCC) Yesterday/at ER presnetatoin: Temperature of 101.8.  Patient noted to be tachycardic as well as having leukocytosis up to 17,000.  Neutrophil predominant.  However clinically there is no source of infection in spite of having chest x-ray done, reasonable history taken as well as UA performed.  Just to be thorough I will check a troponin. Respiratory viral panel is also negative.  A little bit of troponin elevation would be considered nonspecific in this scenario.    Patient has been treated with ceftriaxone.  Patient has had excellent clinical response currently afebrile not looking toxic.  I will take the patient off of telemetry put the patient in observation tonight.  I am okay with continuing the ceftriaxone till the blood cultures come back negative.    Further management dependent on clinical course and blood culture finding.   Patient is pending with home medication collection by pharmcy - please review.  Patient apparently has diabetes mellitus, continue with Tresiba 10 units at evening.  Insulin sliding scale  Patient reports having neuropathy: Continue with gabapentin  Patient reports having COPD, does not use supplemental entry oxygen typically.  However does use it occasionally 2 L/min at nighttime.  Continue with fluticasone fumarate and vilanterol as well as albuterol nebs  Patient has  chronic nausea, continue with Zofran  Patient blood pressure still running a little soft.  I will hold off on antihypertensive regimen just yet.  Patient apparently takes torsemide at home, hold same till vital stable ED documented  Patient further has gout reported, continue with allopurinol.  Troponin pending.   Advance Care Planning:   Code Status: Full Code   Consults: none  Family Communication: per patient. Wife will be here soon.  Severity of Illness: The appropriate patient status for this patient is OBSERVATION. Observation status is judged to be reasonable and necessary in order to provide the required intensity of service to ensure the patient's safety. The patient's presenting symptoms, physical exam findings, and initial radiographic and laboratory data in the context of their medical condition is felt to place them at decreased risk for further clinical deterioration. Furthermore, it is anticipated that the patient will be medically stable for discharge from the hospital within 2 midnights of admission.   Author: Nolberto Hanlon, MD 05/30/2023 10:23 AM  For on call review [email or web address].

## 2023-05-30 NOTE — ED Provider Notes (Incomplete)
[place] EMERGENCY DEPARTMENT AT [facility] Provider Note   CSN: [id number] Arrival date & time: 05/29/23  2100     History {Add pertinent medical, surgical, social history, OB history to HPI:1} Chief Complaint  Patient presents with  . Fever  . Shortness of Breath    John [name] is a 78 y.o. male.  With a history of COPD, hypertension, diabetes, heart failure and lung cancer presents to the ED for fever.  Fever cough congestion increased shortness of breath began at home earlier today.  EMS was called started the patient on nonrebreather and administered 1 g Rocephin IM.  He was noted to be hypotensive.  Was feeling well yesterday.  Denies chest pain nausea vomiting diarrhea abdominal pain.   Fever Shortness of Breath Associated symptoms: fever        Home Medications Prior to Admission medications   Medication Sig Start Date End Date Taking? Authorizing Provider  albuterol (VENTOLIN HFA) 108 (90 Base) MCG/ACT inhaler INHALE 2 PUFFS INTO THE LUNGS EVERY 6 HOURS AS NEEDED FOR WHEEZE OR SHORTNESS OF BREATH 01/19/23   Jannifer Rodney A, FNP  allopurinol (ZYLOPRIM) 300 MG tablet TAKE 1 TABLET BY MOUTH EVERY DAY 03/09/23   Jannifer Rodney A, FNP  Blood Glucose Monitoring Suppl (ONETOUCH VERIO REFLECT) w/Device KIT Use to test blood sugars daily as directed. DX: E11.9 10/14/19   Jannifer Rodney A, FNP  clopidogrel (PLAVIX) 75 MG tablet Take 1 tablet (75 mg total) by mouth daily. 02/05/23 02/05/24  Cephus Shelling, MD  Continuous Glucose Receiver (FREESTYLE LIBRE 3 READER) DEVI 1 Device by Does not apply route every 14 (fourteen) days. 08/11/22   Junie Spencer, FNP  Continuous Glucose Sensor (FREESTYLE LIBRE 3 SENSOR) MISC 1 Device by Does not apply route continuous. 08/11/22   Jannifer Rodney A, FNP  docusate sodium (COLACE) 100 MG capsule Take 1 capsule (100 mg total) by mouth 2 (two) times daily. 02/19/23 02/19/24  Sherryll Burger, Pratik D, DO  dutasteride (AVODART) 0.5 MG  capsule Take 1 capsule (0.5 mg total) by mouth daily. 10/31/22   McKenzie, Mardene Celeste, MD  famotidine (PEPCID) 20 MG tablet Take 1 tablet (20 mg total) by mouth daily. 02/19/23 04/14/23  Sherryll Burger, Pratik D, DO  ferrous sulfate 325 (65 FE) MG EC tablet Take 1 tablet (325 mg total) by mouth 2 (two) times daily. Patient taking differently: Take 325 mg by mouth daily. 02/19/23 02/19/24  Sherryll Burger, Pratik D, DO  Fluticasone-Umeclidin-Vilant (TRELEGY ELLIPTA) 100-62.5-25 MCG/ACT AEPB Inhale 1 puff into the lungs daily. 12/26/22   Junie Spencer, FNP  gabapentin (NEURONTIN) 300 MG capsule Take 1 capsule (300 mg total) by mouth 3 (three) times daily. 04/30/23   Jannifer Rodney A, FNP  insulin degludec (TRESIBA FLEXTOUCH) 200 UNIT/ML FlexTouch Pen Inject 10 Units into the skin every evening. 05/01/23   Jannifer Rodney A, FNP  Insulin Pen Needle (BD PEN NEEDLE NANO U/F) 32G X 4 MM MISC Use to inject insulin once daily 12/29/22   Dani Gobble, NP  levothyroxine (SYNTHROID) 50 MCG tablet Take 1 tablet (50 mcg total) by mouth daily. 04/02/23   Dani Gobble, NP  linaclotide Karlene Einstein) 72 MCG capsule Take 1 capsule (72 mcg total) by mouth daily before breakfast. 04/30/23   Jannifer Rodney A, FNP  metolazone (ZAROXOLYN) 2.5 MG tablet Take 2.5 mg by mouth 3 (three) times a week. 03/13/23   [provider]  NON FORMULARY CPAP at bedtime    [provider]  ondansetron (ZOFRAN) 4 MG tablet Take 1 tablet (4 mg total) by mouth every 8 (eight) hours as needed for nausea or vomiting. 04/30/23   Junie Spencer, FNP  OneTouch Delica Lancets 30G MISC Use to test blood sugars daily as directed. DX: E11.9 02/24/22   Dani Gobble, NP  OXYGEN Inhale 2 L into the lungs daily as needed (with Cpap at night).    [provider]  pantoprazole (PROTONIX) 40 MG tablet Take 1 tablet (40 mg total) by mouth daily. 04/30/23   Jannifer Rodney A, FNP  pravastatin (PRAVACHOL) 40 MG tablet TAKE 1 TABLET (40 MG TOTAL) BY  MOUTH ON MONDAYS , WEDNESDAYS, AND FRIDAYS AS DIRECTED 04/30/23   Jannifer Rodney A, FNP  propranolol (INDERAL) 10 MG tablet Take 1 tablet (10 mg total) by mouth 3 (three) times daily as needed. 04/27/23   Jodelle Gross, NP  spironolactone (ALDACTONE) 25 MG tablet Take 0.5 tablets (12.5 mg total) by mouth daily. 12/30/22 03/30/23  Jodelle Gross, NP  torsemide (DEMADEX) 20 MG tablet Take 1 tablet (20 mg total) by mouth daily. Patient taking differently: Take 20 mg by mouth once. Pt says he takes 1/2 tablet 03/03/22   Jannifer Rodney A, FNP  torsemide (DEMADEX) 20 MG tablet Take 20 mg by mouth daily. Take 1 Tablet Daily    [provider]  zolpidem (AMBIEN) 5 MG tablet Take 1 tablet (5 mg total) by mouth at bedtime as needed for sleep. 04/30/23   Junie Spencer, FNP      Allergies    Bisoprolol, Sulfa antibiotics, Atorvastatin, Jardiance [empagliflozin], Lopressor [metoprolol], Rosuvastatin, Doxycycline, Tramadol, and Temazepam    Review of Systems   Review of Systems  Constitutional:  Positive for fever.  Respiratory:  Positive for shortness of breath.     Physical Exam Updated Vital Signs BP (!) 105/57   Pulse (!) 119   Temp 97.7 F (36.5 C) (Oral)   Resp 18   Ht 5\' 6"  (1.676 m)   Wt 69.4 kg   SpO2 98%   BMI 24.69 kg/m  Physical Exam Vitals and nursing note reviewed.  HENT:     Head: Normocephalic and atraumatic.  Eyes:     Pupils: Pupils are equal, round, and reactive to light.  Cardiovascular:     Rate and Rhythm: Normal rate and regular rhythm.  Pulmonary:     Effort: Pulmonary effort is normal.     Breath sounds: Examination of the right-upper field reveals rhonchi. Examination of the left-upper field reveals rhonchi. Rhonchi present.  Abdominal:     Palpations: Abdomen is soft.     Tenderness: There is no abdominal tenderness.  Skin:    General: Skin is warm and dry.  Neurological:     Mental Status: He is alert.  Psychiatric:        Mood and  Affect: Mood normal.     ED Results / Procedures / Treatments   Labs (all labs ordered are listed, but only abnormal results are displayed) Labs Reviewed  COMPREHENSIVE METABOLIC PANEL - Abnormal; Notable for the following components:      Result Value   Sodium 133 (*)    Chloride 96 (*)    Glucose, Bld 157 (*)    Albumin 3.2 (*)    All other components within normal limits  CBC WITH DIFFERENTIAL/PLATELET - Abnormal; Notable for the following components:   WBC 17.2 (*)    Neutro Abs 15.7 (*)    Lymphs Abs  0.4 (*)    Abs Immature Granulocytes 0.10 (*)    All other components within normal limits  RESP PANEL BY RT-PCR (RSV, FLU A&B, COVID)  [id number]  CULTURE, BLOOD (ROUTINE X 2)  CULTURE, BLOOD (ROUTINE X 2)  PROTIME-INR  APTT  URINALYSIS, W/ REFLEX TO CULTURE (INFECTION SUSPECTED)  I-STAT CG4 LACTIC ACID, ED  I-STAT CG4 LACTIC ACID, ED    EKG None  Radiology DG Chest Port 1 View Result Date: 05/29/2023 CLINICAL DATA:  Fever, shortness of breath EXAM: PORTABLE CHEST 1 VIEW COMPARISON:  02/17/2023.  CT 03/24/2023 FINDINGS: Left Port-A-Cath remains in place, unchanged. Heart and mediastinal contours within normal limits. Aortic atherosclerosis. There is hyperinflation of the lungs compatible with COPD. Nodular densities in the left upper lobe are similar to prior CT. Previously seen lower lobe nodular densities not as well visualized by plain film. No definite acute consolidation or effusion. IMPRESSION: Nodular densities in the left upper lobe/apex are similar to prior CT. COPD. No acute cardiopulmonary disease. Electronically Signed   By: Charlett Nose M.D.   On: 05/29/2023 22:19    Procedures Procedures  {Document cardiac monitor, telemetry assessment procedure when appropriate:1}  Medications Ordered in ED Medications  lactated ringers infusion ( Intravenous New Bag/Given 05/29/23 2256)  sodium chloride 0.9 % bolus 1,000 mL (0 mLs Intravenous Stopped 05/29/23 2358)    And   sodium chloride 0.9 % bolus 1,000 mL (1,000 mLs Intravenous New Bag/Given 05/29/23 2255)    And  sodium chloride 0.9 % bolus 250 mL (has no administration in time range)  azithromycin (ZITHROMAX) 500 mg in sodium chloride 0.9 % 250 mL IVPB (has no administration in time range)    ED Course/ Medical Decision Making/ A&P   {   Click here for ABCD2, HEART and other calculatorsREFRESH Note before signing :1}                              Medical Decision Making 78 year old male with history as above presenting for cough congestion fever all starting today.  Meeting SIRS criteria based on blood pressure and  Amount and/or Complexity of Data Reviewed Labs: ordered. Radiology: ordered.  Risk Prescription drug management.   ***  {Document critical care time when appropriate:1} {Document review of labs and clinical decision tools ie heart score, Chads2Vasc2 etc:1}  {Document your independent review of radiology images, and any outside records:1} {Document your discussion with family members, caretakers, and with consultants:1} {Document social determinants of health affecting pt's care:1} {Document your decision making why or why not admission, treatments were needed:1} Final Clinical Impression(s) / ED Diagnoses Final diagnoses:  None    Rx / DC Orders ED Discharge Orders     None

## 2023-05-30 NOTE — ED Notes (Signed)
 Pt provided with warm blankets.

## 2023-05-30 NOTE — Assessment & Plan Note (Addendum)
 Yesterday/at ER presnetatoin: Temperature of 101.8.  Patient noted to be tachycardic as well as having leukocytosis up to 17,000.  Neutrophil predominant.  However clinically there is no source of infection in spite of having chest x-ray done, reasonable history taken as well as UA performed.  Just to be thorough I will check a troponin. Respiratory viral panel is also negative.  A little bit of troponin elevation would be considered nonspecific in this scenario.    Patient has been treated with ceftriaxone.  Patient has had excellent clinical response currently afebrile not looking toxic.  I will take the patient off of telemetry put the patient in observation tonight.  I am okay with continuing the ceftriaxone till the blood cultures come back negative.    Further management dependent on clinical course and blood culture finding.

## 2023-05-30 NOTE — ED Notes (Signed)
 Pt provided with urinal and encouraged to provide sample. Pt stated they were unable to at this time.

## 2023-05-30 NOTE — ED Provider Notes (Signed)
 Patient signed out to me by Dr. Elayne Snare.  Patient initially seen for evaluation of fever, cough.  Patient noted to have a leukocytosis, tachycardia and some soft blood pressures initially that have improved with IV fluids.  Chest x-ray did not show evidence of pneumonia.  Urinalysis has not returned and does not suggest infection.  COVID, influenza, RSV also negative.  Patient does have SIRS criteria without a clear source.  He received Rocephin and Zithromax at arrival.  Lung exam remains rhonchorous, likely the source, possibly viral.  Will ask hospitalist to admit.   Gilda Crease, MD 05/30/23 [phone]

## 2023-05-31 DIAGNOSIS — D62 Acute posthemorrhagic anemia: Secondary | ICD-10-CM | POA: Diagnosis not present

## 2023-05-31 DIAGNOSIS — I4891 Unspecified atrial fibrillation: Secondary | ICD-10-CM | POA: Diagnosis not present

## 2023-05-31 DIAGNOSIS — K8020 Calculus of gallbladder without cholecystitis without obstruction: Secondary | ICD-10-CM | POA: Diagnosis not present

## 2023-05-31 DIAGNOSIS — N138 Other obstructive and reflux uropathy: Secondary | ICD-10-CM | POA: Diagnosis not present

## 2023-05-31 DIAGNOSIS — I4892 Unspecified atrial flutter: Secondary | ICD-10-CM | POA: Diagnosis not present

## 2023-05-31 DIAGNOSIS — E1151 Type 2 diabetes mellitus with diabetic peripheral angiopathy without gangrene: Secondary | ICD-10-CM | POA: Diagnosis not present

## 2023-05-31 DIAGNOSIS — R6510 Systemic inflammatory response syndrome (SIRS) of non-infectious origin without acute organ dysfunction: Secondary | ICD-10-CM | POA: Diagnosis not present

## 2023-05-31 DIAGNOSIS — R338 Other retention of urine: Secondary | ICD-10-CM | POA: Diagnosis not present

## 2023-05-31 DIAGNOSIS — E039 Hypothyroidism, unspecified: Secondary | ICD-10-CM | POA: Diagnosis not present

## 2023-05-31 DIAGNOSIS — E782 Mixed hyperlipidemia: Secondary | ICD-10-CM | POA: Diagnosis not present

## 2023-05-31 DIAGNOSIS — I700 Atherosclerosis of aorta: Secondary | ICD-10-CM | POA: Diagnosis not present

## 2023-05-31 DIAGNOSIS — E1169 Type 2 diabetes mellitus with other specified complication: Secondary | ICD-10-CM | POA: Diagnosis not present

## 2023-05-31 DIAGNOSIS — I6521 Occlusion and stenosis of right carotid artery: Secondary | ICD-10-CM | POA: Diagnosis not present

## 2023-05-31 DIAGNOSIS — N401 Enlarged prostate with lower urinary tract symptoms: Secondary | ICD-10-CM | POA: Diagnosis not present

## 2023-05-31 DIAGNOSIS — J4489 Other specified chronic obstructive pulmonary disease: Secondary | ICD-10-CM | POA: Diagnosis not present

## 2023-05-31 DIAGNOSIS — F419 Anxiety disorder, unspecified: Secondary | ICD-10-CM | POA: Diagnosis not present

## 2023-05-31 DIAGNOSIS — F5101 Primary insomnia: Secondary | ICD-10-CM | POA: Diagnosis not present

## 2023-05-31 DIAGNOSIS — M150 Primary generalized (osteo)arthritis: Secondary | ICD-10-CM | POA: Diagnosis not present

## 2023-05-31 DIAGNOSIS — E1159 Type 2 diabetes mellitus with other circulatory complications: Secondary | ICD-10-CM | POA: Diagnosis not present

## 2023-05-31 DIAGNOSIS — F32A Depression, unspecified: Secondary | ICD-10-CM | POA: Diagnosis not present

## 2023-05-31 DIAGNOSIS — H3530 Unspecified macular degeneration: Secondary | ICD-10-CM | POA: Diagnosis not present

## 2023-05-31 DIAGNOSIS — F17210 Nicotine dependence, cigarettes, uncomplicated: Secondary | ICD-10-CM | POA: Diagnosis not present

## 2023-05-31 DIAGNOSIS — E1142 Type 2 diabetes mellitus with diabetic polyneuropathy: Secondary | ICD-10-CM | POA: Diagnosis not present

## 2023-05-31 DIAGNOSIS — I5033 Acute on chronic diastolic (congestive) heart failure: Secondary | ICD-10-CM | POA: Diagnosis not present

## 2023-05-31 DIAGNOSIS — I152 Hypertension secondary to endocrine disorders: Secondary | ICD-10-CM | POA: Diagnosis not present

## 2023-05-31 DIAGNOSIS — C3491 Malignant neoplasm of unspecified part of right bronchus or lung: Secondary | ICD-10-CM | POA: Diagnosis not present

## 2023-05-31 LAB — BASIC METABOLIC PANEL
Anion gap: 9 (ref 5–15)
BUN: 12 mg/dL (ref 8–23)
CO2: 25 mmol/L (ref 22–32)
Calcium: 9.2 mg/dL (ref 8.9–10.3)
Chloride: 102 mmol/L (ref 98–111)
Creatinine, Ser: 0.87 mg/dL (ref 0.61–1.24)
GFR, Estimated: >60 mL/min (ref >60)
Glucose, Bld: 139 mg/dL — ABNORMAL HIGH (ref 70–99)
Potassium: 3.9 mmol/L (ref 3.5–5.1)
Sodium: 136 mmol/L (ref 135–145)

## 2023-05-31 LAB — CBC
HCT: 36.7 % — ABNORMAL LOW (ref 39.0–52.0)
Hemoglobin: 11.8 g/dL — ABNORMAL LOW (ref 13.0–17.0)
MCH: 31.7 pg (ref 26.0–34.0)
MCHC: 32.2 g/dL (ref 30.0–36.0)
MCV: 98.7 fL (ref 80.0–100.0)
Platelets: 302 10*3/uL (ref 150–400)
RBC: 3.72 MIL/uL — ABNORMAL LOW (ref 4.22–5.81)
RDW: 14.8 % (ref 11.5–15.5)
WBC: 10 10*3/uL (ref 4.0–10.5)
nRBC: 0 % (ref 0.0–0.2)

## 2023-05-31 LAB — PROTIME-INR
INR: 1.1 (ref 0.8–1.2)
Prothrombin Time: 14.1 s (ref 11.4–15.2)

## 2023-05-31 LAB — APTT: aPTT: 41 s — ABNORMAL HIGH (ref 24–36)

## 2023-05-31 LAB — GLUCOSE, CAPILLARY: Glucose-Capillary: 123 mg/dL — ABNORMAL HIGH (ref 70–99)

## 2023-05-31 LAB — TSH: TSH: 1.061 u[IU]/mL (ref 0.350–4.500)

## 2023-05-31 LAB — VITAMIN B12: Vitamin B-12: 278 pg/mL (ref 180–914)

## 2023-05-31 MED ORDER — DOCUSATE SODIUM 100 MG PO CAPS: 100.0000 mg | ORAL_CAPSULE | Freq: Every day | ORAL | Status: DC | PRN

## 2023-05-31 MED ORDER — FERROUS SULFATE 325 (65 FE) MG PO TBEC
325.0000 mg | DELAYED_RELEASE_TABLET | Freq: Every day | ORAL | Status: DC
Start: 2023-05-31 — End: 2023-10-20

## 2023-05-31 MED ORDER — CEFUROXIME AXETIL 500 MG PO TABS: 500.0000 mg | ORAL_TABLET | Freq: Two times a day (BID) | ORAL | 0 refills | Status: AC

## 2023-05-31 MED ORDER — GABAPENTIN 300 MG PO CAPS: 300.0000 mg | ORAL_CAPSULE | Freq: Two times a day (BID) | ORAL | Status: DC

## 2023-05-31 MED ORDER — ORAL CARE MOUTH RINSE: 15.0000 mL | OROMUCOSAL | Status: DC | PRN

## 2023-05-31 NOTE — Discharge Summary (Signed)
 Physician Discharge Summary  John [name] MWU:[id number] DOB: 1945-08-29 DOA: 05/29/2023  PCP: Junie Spencer, FNP  Admit date: 05/29/2023 Discharge date: 05/31/2023  Admitted From: Home Disposition: Home  Recommendations for Outpatient Follow-up:  Follow up with PCP in 1 week with repeat CBC/BMP Follow up in ED if symptoms worsen or new appear   Home Health: No Equipment/Devices: None  Discharge Condition: Stable CODE STATUS: Full Diet recommendation: Heart healthy  Brief/Interim Summary: 78 year old male with history of adenocarcinoma of the lung (believed to be in remission), chronic respiratory failure with hypoxia requiring supplemental oxygen as needed at home, diabetes mellitus type 2, hypertension, hyperlipidemia, hypothyroidism, BPH, OSA on CPAP presented with fever and worsening shortness of breath.  EMS noted him to have a temperature of 101.8.  He was found to have elevated WBCs of 17.2.  COVID/influenza/RSV PCR negative.  Chest x-ray and UA was unremarkable.  He was started on broad-spectrum antibiotics.  During the hospitalization, his condition is improved.  He is not had any more fever since admission and his WBCs have normalized.  He feels okay to go home today.  He will be discharged home today on 5 more days of oral Ceftin.  Discharge Diagnoses:   SIRS Fever -Presented with fever, tachycardia, leukocytosis with shortness of breath.  UA and chest x-ray were unremarkable.  Currently on Rocephin.  Afebrile since admission with normal WBC today.  He feels much better and wants to go home today.  Discharge patient home today on oral Ceftin for 5 more days.  Blood cultures have been negative so far.  Chronic respiratory failure with hypoxia COPD History of adenocarcinoma of the lung, believed to be in remission -Uses 2 L oxygen via nasal cannula intermittently at home.  Currently requiring 2 L oxygen.  Continue supplemental oxygen on discharge.  Continue home inhaled  regimen.  Diabetes mellitus type 2 -Continue long-acting insulin.  Outpatient follow-up with PCP.  Carb modified diet  Leukocytosis -Resolved  Normocytic anemia/anemia of chronic disease -From chronic illnesses.  Hemoglobin stable.  Monitor intermittently as an outpatient  Hyponatremia -Resolved  Hypertension -Blood pressure intermittently on the lower side.  Hold spironolactone.  Outpatient follow-up with PCP  Hypothyroidism -Continue levothyroxine  Hyperlipidemia -Continue statin  History of gout -Continue allopurinol  Discharge Instructions   Allergies as of 05/31/2023       Reactions   Bisoprolol Other (See Comments)   Confusion, delirium   Sulfa Antibiotics Swelling   Mouth swelling   Atorvastatin Other (See Comments)   Muscle aches - tolerating Pravastatin 40 mg MWF   Jardiance [empagliflozin] Other (See Comments)   FEELS SLUGGISH, TIRED   Lopressor [metoprolol] Other (See Comments)   Fatigue   Rosuvastatin Other (See Comments)   Muscle aches - tolerating Pravastatin 40 mg MWF   Doxycycline Nausea Only   Temazepam Other (See Comments)   Made insomnia worse    Tramadol Itching        Medication List     STOP taking these medications    metolazone 2.5 MG tablet Commonly known as: ZAROXOLYN   spironolactone 25 MG tablet Commonly known as: ALDACTONE       TAKE these medications    albuterol 108 (90 Base) MCG/ACT inhaler Commonly known as: VENTOLIN HFA INHALE 2 PUFFS INTO THE LUNGS EVERY 6 HOURS AS NEEDED FOR WHEEZE OR SHORTNESS OF BREATH   allopurinol 300 MG tablet Commonly known as: ZYLOPRIM TAKE 1 TABLET BY MOUTH EVERY DAY   BD Pen Needle  Nano U/F 32G X 4 MM Misc Generic drug: Insulin Pen Needle Use to inject insulin once daily   cefUROXime 500 MG tablet Commonly known as: CEFTIN Take 1 tablet (500 mg total) by mouth 2 (two) times daily for 5 days. Start taking on: June 01, 2023   clopidogrel 75 MG tablet Commonly known as:  Plavix Take 1 tablet (75 mg total) by mouth daily.   docusate sodium 100 MG capsule Commonly known as: Colace Take 1 capsule (100 mg total) by mouth daily as needed for mild constipation.   dutasteride 0.5 MG capsule Commonly known as: AVODART Take 1 capsule (0.5 mg total) by mouth daily.   famotidine 20 MG tablet Commonly known as: PEPCID Take 1 tablet (20 mg total) by mouth daily.   ferrous sulfate 325 (65 FE) MG EC tablet Take 1 tablet (325 mg total) by mouth daily.   FreeStyle Libre 3 Reader Devi 1 Device by Does not apply route every 14 (fourteen) days.   FreeStyle Libre 3 Sensor Misc 1 Device by Does not apply route continuous.   gabapentin 300 MG capsule Commonly known as: NEURONTIN Take 1-2 capsules (300-600 mg total) by mouth 2 (two) times daily. Take 300mg  (1 cap) in the morning and 600mg  (2 caps) in the evening for a total daily dose of 900mg .   levothyroxine 50 MCG tablet Commonly known as: SYNTHROID Take 1 tablet (50 mcg total) by mouth daily.   linaclotide 72 MCG capsule Commonly known as: Linzess Take 1 capsule (72 mcg total) by mouth daily before breakfast.   NON FORMULARY CPAP at bedtime   ondansetron 4 MG tablet Commonly known as: Zofran Take 1 tablet (4 mg total) by mouth every 8 (eight) hours as needed for nausea or vomiting.   OneTouch Delica Lancets 30G Misc Use to test blood sugars daily as directed. DX: E11.9   OneTouch Verio Reflect w/Device Kit Use to test blood sugars daily as directed. DX: E11.9   OXYGEN Inhale 2 L into the lungs daily as needed (with Cpap at night).   pantoprazole 40 MG tablet Commonly known as: PROTONIX Take 1 tablet (40 mg total) by mouth daily.   pravastatin 40 MG tablet Commonly known as: PRAVACHOL TAKE 1 TABLET (40 MG TOTAL) BY MOUTH ON MONDAYS , WEDNESDAYS, AND FRIDAYS AS DIRECTED   propranolol 10 MG tablet Commonly known as: INDERAL Take 1 tablet (10 mg total) by mouth 3 (three) times daily as needed.    Trelegy Ellipta 100-62.5-25 MCG/ACT Aepb Generic drug: Fluticasone-Umeclidin-Vilant Inhale 1 puff into the lungs daily.   Evaristo Bury FlexTouch 200 UNIT/ML FlexTouch Pen Generic drug: insulin degludec Inject 10 Units into the skin every evening.   zolpidem 5 MG tablet Commonly known as: AMBIEN Take 1 tablet (5 mg total) by mouth at bedtime as needed for sleep.         Follow-up Information     Junie Spencer, FNP. Schedule an appointment as soon as possible for a visit in 1 week(s).   Specialty: Family Medicine Contact information: [address] [place] [address] [phone]                Allergies  Allergen Reactions   Bisoprolol Other (See Comments)    Confusion, delirium    Sulfa Antibiotics Swelling    Mouth swelling   Atorvastatin Other (See Comments)    Muscle aches - tolerating Pravastatin 40 mg MWF   Jardiance [Empagliflozin] Other (See Comments)    FEELS SLUGGISH, TIRED  Lopressor [Metoprolol] Other (See Comments)    Fatigue   Rosuvastatin Other (See Comments)    Muscle aches - tolerating Pravastatin 40 mg MWF   Doxycycline Nausea Only   Temazepam Other (See Comments)    Made insomnia worse    Tramadol Itching    Consultations: None   Procedures/Studies: DG Chest Port 1 View Result Date: 05/29/2023 CLINICAL DATA:  Fever, shortness of breath EXAM: PORTABLE CHEST 1 VIEW COMPARISON:  02/17/2023.  CT 03/24/2023 FINDINGS: Left Port-A-Cath remains in place, unchanged. Heart and mediastinal contours within normal limits. Aortic atherosclerosis. There is hyperinflation of the lungs compatible with COPD. Nodular densities in the left upper lobe are similar to prior CT. Previously seen lower lobe nodular densities not as well visualized by plain film. No definite acute consolidation or effusion. IMPRESSION: Nodular densities in the left upper lobe/apex are similar to prior CT. COPD. No acute cardiopulmonary disease. Electronically Signed   By:  Charlett Nose M.D.   On: 05/29/2023 22:19   CUP PACEART REMOTE DEVICE CHECK Result Date: 05/26/2023 ILR summary report received. Battery status OK. Normal device function. No new symptom, tachy, brady, or pause episodes. No new AF episodes. Monthly summary reports and ROV/PRN - CS, CVRS     Subjective: Patient seen and examined at bedside.  He feels much better and wants to go home today.  No fever, vomiting, worsening abdominal pain since admission.  Discharge Exam: Vitals:   05/31/23 0545 05/31/23 0650  BP: (!) 117/59   Pulse: (!) 117   Resp: 19 20  Temp: 98.7 F (37.1 C)   SpO2: 90%     General: Pt is alert, awake, not in acute distress.  On 2 L oxygen by nasal cannula.  Elderly male sitting on bed. Cardiovascular: Tachycardic, S1/S2 + Respiratory: bilateral decreased breath sounds at bases with scattered crackles Abdominal: Soft, NT, ND, bowel sounds + Extremities: no edema, no cyanosis    The results of significant diagnostics from this hospitalization (including imaging, microbiology, ancillary and laboratory) are listed below for reference.     Microbiology: Recent Results (from the past 240 hours)  Blood Culture (routine x 2)     Status: None (Preliminary result)   Collection Time: 05/29/23  9:17 PM   Specimen: BLOOD LEFT HAND  Result Value Ref Range Status   Specimen Description BLOOD LEFT HAND  Final   Special Requests   Final    BOTTLES DRAWN AEROBIC AND ANAEROBIC Blood Culture adequate volume   Culture   Final    NO GROWTH 2 DAYS Performed at [facility], [address] [place], [place] [address]    Report Status PENDING  Incomplete  Blood Culture (routine x 2)     Status: None (Preliminary result)   Collection Time: 05/29/23  9:53 PM   Specimen: BLOOD  Result Value Ref Range Status   Specimen Description BLOOD BLOOD RIGHT HAND  Final   Special Requests   Final    BOTTLES DRAWN AEROBIC AND ANAEROBIC Blood Culture adequate volume   Culture    Final    NO GROWTH 2 DAYS Performed at [facility], [address] [place], [place] [address]    Report Status PENDING  Incomplete  Resp panel by RT-PCR (RSV, Flu A&B, Covid) Anterior Nasal Swab     Status: None   Collection Time: 05/30/23 12:00 AM   Specimen: Anterior Nasal Swab  Result Value Ref Range Status   SARS Coronavirus 2 by RT PCR  NEGATIVE NEGATIVE Final   Influenza A by PCR NEGATIVE NEGATIVE Final   Influenza B by PCR NEGATIVE NEGATIVE Final    Comment: (NOTE) The Xpert Xpress SARS-CoV-2/FLU/RSV plus assay is intended as an aid in the diagnosis of influenza from Nasopharyngeal swab specimens and should not be used as a sole basis for treatment. Nasal washings and aspirates are unacceptable for Xpert Xpress SARS-CoV-2/FLU/RSV testing.  Fact Sheet for Patients: BloggerCourse.com  Fact Sheet for Healthcare Providers: SeriousBroker.it  This test is not yet approved or cleared by the [place] FDA and has been authorized for detection and/or diagnosis of SARS-CoV-2 by FDA under an Emergency Use Authorization (EUA). This EUA will remain in effect (meaning this test can be used) for the duration of the COVID-19 declaration under Section 564(b)(1) of the Act, 21 U.S.C. section 360bbb-3(b)(1), unless the authorization is terminated or revoked.     Resp Syncytial Virus by PCR NEGATIVE NEGATIVE Final    Comment: (NOTE) Fact Sheet for Patients: BloggerCourse.com  Fact Sheet for Healthcare Providers: SeriousBroker.it  This test is not yet approved or cleared by the [place] FDA and has been authorized for detection and/or diagnosis of SARS-CoV-2 by FDA under an Emergency Use Authorization (EUA). This EUA will remain in effect (meaning this test can be used) for the duration of the COVID-19 declaration under Section 564(b)(1) of the Act, 21 U.S.C. section  360bbb-3(b)(1), unless the authorization is terminated or revoked.  Performed at [facility], [address], [place], [place] [address]      Labs: BNP (last 3 results) Recent Labs    02/13/23 1233  BNP 104.3*   Basic Metabolic Panel: Recent Labs  Lab 05/29/23 2123 05/30/23 1339 05/31/23 0607  NA 133*  --  136  K 4.1  --  3.9  CL 96*  --  102  CO2 24  --  25  GLUCOSE 157*  --  139*  BUN 20  --  12  CREATININE 1.21 0.78 0.87  CALCIUM 9.1  --  9.2   Liver Function Tests: Recent Labs  Lab 05/29/23 2123  AST 22  ALT 20  ALKPHOS 125  BILITOT 0.5  PROT 7.2  ALBUMIN 3.2*   No results for input(s): "LIPASE", "AMYLASE" in the last 168 hours. No results for input(s): "AMMONIA" in the last 168 hours. CBC: Recent Labs  Lab 05/29/23 2123 05/30/23 1339 05/31/23 0607  WBC 17.2* 12.3* 10.0  NEUTROABS 15.7*  --   --   HGB 14.2 12.8* 11.8*  HCT 43.5 40.3 36.7*  MCV 98.0 99.3 98.7  PLT 364 293 302   Cardiac Enzymes: No results for input(s): "CKTOTAL", "CKMB", "CKMBINDEX", "TROPONINI" in the last 168 hours. BNP: Invalid input(s): "POCBNP" CBG: Recent Labs  Lab 05/30/23 1151 05/30/23 1840 05/30/23 2150 05/31/23 0756  GLUCAP 114* 113* 106* 123*   D-Dimer No results for input(s): "DDIMER" in the last 72 hours. Hgb A1c No results for input(s): "HGBA1C" in the last 72 hours. Lipid Profile No results for input(s): "CHOL", "HDL", "LDLCALC", "TRIG", "CHOLHDL", "LDLDIRECT" in the last 72 hours. Thyroid function studies Recent Labs    05/31/23 0607  TSH 1.061   Anemia work up Recent Labs    05/31/23 0607  VITAMINB12 278   Urinalysis    Component Value Date/Time   COLORURINE YELLOW 05/30/2023 0124   APPEARANCEUR CLEAR 05/30/2023 0124   APPEARANCEUR Clear 10/31/2022 0958   LABSPEC 1.009 05/30/2023 0124   PHURINE 5.0 05/30/2023 0124   GLUCOSEU NEGATIVE  05/30/2023 0124   HGBUR NEGATIVE 05/30/2023 0124   BILIRUBINUR NEGATIVE 05/30/2023 0124    BILIRUBINUR Negative 10/31/2022 0958   KETONESUR NEGATIVE 05/30/2023 0124   PROTEINUR NEGATIVE 05/30/2023 0124   NITRITE NEGATIVE 05/30/2023 0124   LEUKOCYTESUR NEGATIVE 05/30/2023 0124   Sepsis Labs Recent Labs  Lab 05/29/23 2123 05/30/23 1339 05/31/23 0607  WBC 17.2* 12.3* 10.0   Microbiology Recent Results (from the past 240 hours)  Blood Culture (routine x 2)     Status: None (Preliminary result)   Collection Time: 05/29/23  9:17 PM   Specimen: BLOOD LEFT HAND  Result Value Ref Range Status   Specimen Description BLOOD LEFT HAND  Final   Special Requests   Final    BOTTLES DRAWN AEROBIC AND ANAEROBIC Blood Culture adequate volume   Culture   Final    NO GROWTH 2 DAYS Performed at [facility], [address] [place], [place] [address]    Report Status PENDING  Incomplete  Blood Culture (routine x 2)     Status: None (Preliminary result)   Collection Time: 05/29/23  9:53 PM   Specimen: BLOOD  Result Value Ref Range Status   Specimen Description BLOOD BLOOD RIGHT HAND  Final   Special Requests   Final    BOTTLES DRAWN AEROBIC AND ANAEROBIC Blood Culture adequate volume   Culture   Final    NO GROWTH 2 DAYS Performed at [facility], [address] [place], [place] [address]    Report Status PENDING  Incomplete  Resp panel by RT-PCR (RSV, Flu A&B, Covid) Anterior Nasal Swab     Status: None   Collection Time: 05/30/23 12:00 AM   Specimen: Anterior Nasal Swab  Result Value Ref Range Status   SARS Coronavirus 2 by RT PCR NEGATIVE NEGATIVE Final   Influenza A by PCR NEGATIVE NEGATIVE Final   Influenza B by PCR NEGATIVE NEGATIVE Final    Comment: (NOTE) The Xpert Xpress SARS-CoV-2/FLU/RSV plus assay is intended as an aid in the diagnosis of influenza from Nasopharyngeal swab specimens and should not be used as a sole basis for treatment. Nasal washings and aspirates are unacceptable for Xpert Xpress SARS-CoV-2/FLU/RSV testing.  Fact Sheet for  Patients: BloggerCourse.com  Fact Sheet for Healthcare Providers: SeriousBroker.it  This test is not yet approved or cleared by the [place] FDA and has been authorized for detection and/or diagnosis of SARS-CoV-2 by FDA under an Emergency Use Authorization (EUA). This EUA will remain in effect (meaning this test can be used) for the duration of the COVID-19 declaration under Section 564(b)(1) of the Act, 21 U.S.C. section 360bbb-3(b)(1), unless the authorization is terminated or revoked.     Resp Syncytial Virus by PCR NEGATIVE NEGATIVE Final    Comment: (NOTE) Fact Sheet for Patients: BloggerCourse.com  Fact Sheet for Healthcare Providers: SeriousBroker.it  This test is not yet approved or cleared by the [place] FDA and has been authorized for detection and/or diagnosis of SARS-CoV-2 by FDA under an Emergency Use Authorization (EUA). This EUA will remain in effect (meaning this test can be used) for the duration of the COVID-19 declaration under Section 564(b)(1) of the Act, 21 U.S.C. section 360bbb-3(b)(1), unless the authorization is terminated or revoked.  Performed at [facility], [address] [place], [place] [address]      Time coordinating discharge: 35 minutes  SIGNED:   Glade Lloyd, MD  [facility] 05/31/2023, 9:33 AM

## 2023-05-31 NOTE — Progress Notes (Signed)
 Patient requesting his home Ambien 5 mg be ordered while in the hospital for sleep.  Dr. Joneen Roach made aware.  Order received and implemented.  05/30/23 2239  Provider Notification  Provider Name/Title Dr. Joneen Roach  Date Provider Notified 05/30/23  Time Provider Notified 2239  Method of Notification Page  Notification Reason Requested by patient/family (Requesting 5 mg Ambien PO at night for sleep)  Provider response See new orders  Date of Provider Response 05/30/23   Bernie Covey RN

## 2023-06-01 ENCOUNTER — Telehealth: Payer: Self-pay | Admitting: *Deleted

## 2023-06-01 ENCOUNTER — Telehealth: Payer: Self-pay | Admitting: Family Medicine

## 2023-06-01 NOTE — Telephone Encounter (Signed)
 Verbal given

## 2023-06-01 NOTE — Telephone Encounter (Unsigned)
 Copied from CRM [id number]. Topic: Clinical - Home Health Verbal Orders >> Jun 01, 2023  3:20 PM Adelina Mings wrote: Caller/Agency: Misty Stanley from [facility] Callback Number: [phone] Service Requested: Physical Therapy Frequency: may change Any new concerns about the patient? Yes was hospitalized

## 2023-06-01 NOTE — Transitions of Care (Post Inpatient/ED Visit) (Signed)
   06/01/2023  Name: ALEXEY RHOADS MRN: [id number] DOB: [birth year]  Today's TOC FU Call Status: Today's TOC FU Call Status:: Unsuccessful Call (1st Attempt) Unsuccessful Call (1st Attempt) Date: 06/01/23  Attempted to reach the patient regarding the most recent Inpatient/ED visit.  Follow Up Plan: Additional outreach attempts will be made to reach the patient to complete the Transitions of Care (Post Inpatient/ED visit) call.   Irving Shows [facility], BSN RN Care Manager/ Transition of Care [place]/ [facility] [phone]

## 2023-06-02 ENCOUNTER — Other Ambulatory Visit: Payer: Self-pay | Admitting: Cardiovascular Disease

## 2023-06-02 ENCOUNTER — Telehealth: Payer: Self-pay | Admitting: *Deleted

## 2023-06-02 ENCOUNTER — Telehealth: Payer: Self-pay | Admitting: Cardiology

## 2023-06-02 NOTE — Telephone Encounter (Signed)
 Patient states that he got a message in regards to a refill request of torsemide. It is not currently on his medication list and he doesn't currently have any.

## 2023-06-02 NOTE — Telephone Encounter (Signed)
 Pt returning call to a nurse

## 2023-06-02 NOTE — Transitions of Care (Post Inpatient/ED Visit) (Signed)
   06/02/2023  Name: John [name] MRN: [id number] DOB: [birth year]  Today's TOC FU Call Status: Today's TOC FU Call Status:: Unsuccessful Call (2nd Attempt) Unsuccessful Call (2nd Attempt) Date: 06/02/23  Attempted to reach the patient regarding the most recent Inpatient/ED visit.  Follow Up Plan: Additional outreach attempts will be made to reach the patient to complete the Transitions of Care (Post Inpatient/ED visit) call.   Irving Shows [facility], BSN RN Care Manager/ Transition of Care [place]/ [facility] [phone]

## 2023-06-03 ENCOUNTER — Telehealth: Payer: Self-pay | Admitting: *Deleted

## 2023-06-03 ENCOUNTER — Other Ambulatory Visit: Payer: Self-pay | Admitting: *Deleted

## 2023-06-03 DIAGNOSIS — E1165 Type 2 diabetes mellitus with hyperglycemia: Secondary | ICD-10-CM

## 2023-06-03 DIAGNOSIS — D62 Acute posthemorrhagic anemia: Secondary | ICD-10-CM | POA: Diagnosis not present

## 2023-06-03 DIAGNOSIS — E1142 Type 2 diabetes mellitus with diabetic polyneuropathy: Secondary | ICD-10-CM | POA: Diagnosis not present

## 2023-06-03 DIAGNOSIS — Z794 Long term (current) use of insulin: Secondary | ICD-10-CM

## 2023-06-03 DIAGNOSIS — I152 Hypertension secondary to endocrine disorders: Secondary | ICD-10-CM | POA: Diagnosis not present

## 2023-06-03 DIAGNOSIS — I5033 Acute on chronic diastolic (congestive) heart failure: Secondary | ICD-10-CM | POA: Diagnosis not present

## 2023-06-03 DIAGNOSIS — Z7984 Long term (current) use of oral hypoglycemic drugs: Secondary | ICD-10-CM

## 2023-06-03 DIAGNOSIS — E1159 Type 2 diabetes mellitus with other circulatory complications: Secondary | ICD-10-CM | POA: Diagnosis not present

## 2023-06-03 DIAGNOSIS — I4891 Unspecified atrial fibrillation: Secondary | ICD-10-CM | POA: Diagnosis not present

## 2023-06-03 LAB — CULTURE, BLOOD (ROUTINE X 2)
Culture: NO GROWTH
Culture: NO GROWTH
Special Requests: ADEQUATE
Special Requests: ADEQUATE

## 2023-06-03 MED ORDER — INSULIN DEGLUDEC FLEXTOUCH 200 UNIT/ML ~~LOC~~ SOPN: PEN_INJECTOR | SUBCUTANEOUS | 3 refills | Status: DC

## 2023-06-03 NOTE — Telephone Encounter (Signed)
 Patient identification verified by 2 forms. Marilynn Rail, RN    Called and spoke to patient  Patient states:   -PCP fills his diruetics   -has appointment PCP on Friday   -will follow up with PCP instead  Informed patient:   -will be due for follow up with Dr. Flora Lipps 10/2023  -outreach at the end of March to schedule   -outreach if needs sooner follow up after PCP appointment  Patient verbalized understanding, no questions at this time

## 2023-06-03 NOTE — Transitions of Care (Post Inpatient/ED Visit) (Signed)
   06/03/2023  Name: John [name] MRN: [id number] DOB: [birth year]  Today's TOC FU Call Status: Today's TOC FU Call Status:: Unsuccessful Call (3rd Attempt) Unsuccessful Call (3rd Attempt) Date: 06/03/23  Attempted to reach the patient regarding the most recent Inpatient/ED visit.  Follow Up Plan: No further outreach attempts will be made at this time. We have been unable to contact the patient.  Irving Shows [facility], BSN RN Care Manager/ Transition of Care [place]/ [facility] [phone]

## 2023-06-03 NOTE — Telephone Encounter (Signed)
 Looks like his PCP stopped his diuretics. Will need to be seen in office before restarting.  Gerri Spore T. Flora Lipps, MD, [facility]

## 2023-06-05 ENCOUNTER — Encounter: Payer: Self-pay | Admitting: Family

## 2023-06-05 ENCOUNTER — Ambulatory Visit (INDEPENDENT_AMBULATORY_CARE_PROVIDER_SITE_OTHER): Payer: Medicare Other | Admitting: Family

## 2023-06-05 VITALS — BP 129/61 | HR 98 | Temp 97.7°F | Ht 66.0 in | Wt 150.4 lb

## 2023-06-05 DIAGNOSIS — J449 Chronic obstructive pulmonary disease, unspecified: Secondary | ICD-10-CM | POA: Diagnosis not present

## 2023-06-05 DIAGNOSIS — E1142 Type 2 diabetes mellitus with diabetic polyneuropathy: Secondary | ICD-10-CM

## 2023-06-05 DIAGNOSIS — I152 Hypertension secondary to endocrine disorders: Secondary | ICD-10-CM | POA: Diagnosis not present

## 2023-06-05 DIAGNOSIS — R6510 Systemic inflammatory response syndrome (SIRS) of non-infectious origin without acute organ dysfunction: Secondary | ICD-10-CM

## 2023-06-05 DIAGNOSIS — I5032 Chronic diastolic (congestive) heart failure: Secondary | ICD-10-CM

## 2023-06-05 DIAGNOSIS — E1159 Type 2 diabetes mellitus with other circulatory complications: Secondary | ICD-10-CM

## 2023-06-05 DIAGNOSIS — J9611 Chronic respiratory failure with hypoxia: Secondary | ICD-10-CM | POA: Diagnosis not present

## 2023-06-05 DIAGNOSIS — Z09 Encounter for follow-up examination after completed treatment for conditions other than malignant neoplasm: Secondary | ICD-10-CM | POA: Diagnosis not present

## 2023-06-05 DIAGNOSIS — C3491 Malignant neoplasm of unspecified part of right bronchus or lung: Secondary | ICD-10-CM

## 2023-06-05 MED ORDER — TORSEMIDE 20 MG PO TABS: 10.0000 mg | ORAL_TABLET | Freq: Every day | ORAL | 2 refills | Status: DC

## 2023-06-05 NOTE — Progress Notes (Signed)
 Subjective:    Patient ID: John [name], male    DOB: 1945-05-09, 78 y.o.   MRN: [id number]  Chief Complaint  Patient presents with   Hospitalization Follow-up   PT presents to the office today for hospital follow up. He went to the ED on 03/27/24 with SOB, fevers, and low O2 sat. His COVID, FLU, RSV were negative. Negative chest x-ray and UA. He was diagnosed with SIRS. Given Rocephin and discharged home on Ceftin.   Blood cultures negative.   Has hx of COPD, Chronic respiratory failure with hypoxia, hx adenocarcinoma of lung, DM, CHF, and HTN.  Wears 2L of O2 as needed at home.  Shortness of Breath This is a chronic problem. The problem occurs intermittently. Associated symptoms include sputum production. Pertinent negatives include no leg swelling, orthopnea, rhinorrhea or sore throat.      Review of Systems  HENT:  Negative for rhinorrhea and sore throat.   Respiratory:  Positive for sputum production and shortness of breath.   Cardiovascular:  Negative for orthopnea and leg swelling.  All other systems reviewed and are negative.   Social History   Socioeconomic History   Marital status: Significant Other    Spouse name: Not on file   Number of children: 5   Years of education: 10   Highest education level: 10th grade  Occupational History   Occupation: Retired  Tobacco Use   Smoking status: Every Day    Current packs/day: 0.50    Average packs/day: 0.5 packs/day for 62.2 years (31.1 ttl pk-yrs)    Types: Cigarettes    Start date: 03/11/1961   Smokeless tobacco: Never   Tobacco comments:    1/2 pack to 1 pack per day 12/14/19  Vaping Use   Vaping status: Never Used  Substance and Sexual Activity   Alcohol use: No   Drug use: No   Sexual activity: Yes    Birth control/protection: None  Other Topics Concern   Not on file  Social History Narrative   [name] is his POA and lives with him. Children out of town - one in [facility], others in [place].    Social Drivers of Corporate investment banker Strain: Low Risk  (05/30/2023)   Overall Financial Resource Strain (CARDIA)    Difficulty of Paying Living Expenses: Not very hard  Food Insecurity: No Food Insecurity (05/30/2023)   Hunger Vital Sign    Worried About Running Out of Food in the Last Year: Never true    Ran Out of Food in the Last Year: Never true  Transportation Needs: No Transportation Needs (05/30/2023)   PRAPARE - Administrator, Civil Service (Medical): No    Lack of Transportation (Non-Medical): No  Physical Activity: Insufficiently Active (05/30/2023)   Exercise Vital Sign    Days of Exercise per Week: 3 days    Minutes of Exercise per Session: 10 min  Stress: No Stress Concern Present (05/30/2023)   [employer] of Occupational Health - Occupational Stress Questionnaire    Feeling of Stress : Not at all  Social Connections: Moderately Isolated (05/30/2023)   Social Connection and Isolation Panel [NHANES]    Frequency of Communication with Friends and Family: Twice a week    Frequency of Social Gatherings with Friends and Family: Twice a week    Attends Religious Services: Never    Database administrator or Organizations: No    Attends Banker Meetings: Never  Marital Status: Married   Family History  Problem Relation Age of Onset   Hypertension Mother    Diabetes Father    Heart disease Father    Stroke Father    Hypertension Sister    Sleep apnea Neg Hx         Objective:   Physical Exam Vitals reviewed.  Constitutional:      General: He is not in acute distress.    Appearance: He is well-developed.  HENT:     Head: Normocephalic.     Right Ear: Tympanic membrane and external ear normal.     Left Ear: Tympanic membrane and external ear normal.  Eyes:     General:        Right eye: No discharge.        Left eye: No discharge.     Pupils: Pupils are equal, round, and reactive to light.  Neck:     Thyroid: No  thyromegaly.  Cardiovascular:     Rate and Rhythm: Normal rate and regular rhythm.     Heart sounds: Normal heart sounds. No murmur heard. Pulmonary:     Effort: Pulmonary effort is normal. No respiratory distress.     Breath sounds: Rhonchi present. No wheezing.  Abdominal:     General: Bowel sounds are normal. There is no distension.     Palpations: Abdomen is soft.     Tenderness: There is no abdominal tenderness.  Musculoskeletal:        General: No tenderness. Normal range of motion.     Cervical back: Normal range of motion and neck supple.  Skin:    General: Skin is warm and dry.     Findings: No erythema or rash.  Neurological:     Mental Status: He is alert and oriented to person, place, and time.     Cranial Nerves: No cranial nerve deficit.     Motor: Weakness (using rolling walker) present.     Gait: Gait abnormal.     Deep Tendon Reflexes: Reflexes are normal and symmetric.  Psychiatric:        Behavior: Behavior normal.        Thought Content: Thought content normal.        Judgment: Judgment normal.       BP 129/61   Pulse 98   Temp 97.7 F (36.5 C) (Temporal)   Ht 5\' 6"  (1.676 m)   Wt 150 lb 6.4 oz (68.2 kg)   SpO2 94%   BMI 24.28 kg/m      Assessment & Plan:  John [name] comes in today with chief complaint of Hospitalization Follow-up   Diagnosis and orders addressed:  1. Hypertension associated with diabetes (HCC) (Primary) - CMP14+EGFR - CBC with Differential/Platelet  2. Chronic obstructive pulmonary disease, unspecified COPD type (HCC) - CMP14+EGFR - CBC with Differential/Platelet  3. Non-small cell lung cancer, right (HCC) - CMP14+EGFR - CBC with Differential/Platelet  4. Type 2 diabetes mellitus with diabetic polyneuropathy, without long-term current use of insulin (HCC - CMP14+EGFR - CBC with Differential/Platelet  5. Chronic respiratory failure with hypoxia (HCC) - CMP14+EGFR - CBC with Differential/Platelet  6. SIRS  (systemic inflammatory response syndrome) (HCC) - CMP14+EGFR - CBC with Differential/Platelet  7. Hospital discharge follow-up - CMP14+EGFR - CBC with Differential/Platelet  8. Chronic diastolic CHF (congestive heart failure) (HCC) - torsemide (DEMADEX) 20 MG tablet; Take 0.5 tablets (10 mg total) by mouth daily.  Dispense: 90 tablet; Refill: 2   Labs pending [facility]  notes reviewed  Continue current medications  Keep follow up with specialists  Health Maintenance reviewed Diet and exercise encouraged Keep chronic follow up    Jannifer Rodney, FNP

## 2023-06-05 NOTE — Patient Instructions (Signed)
 Sepsis, Diagnosis, Adult Sepsis is a serious bodily reaction to an infection. The infection that triggers sepsis may be from a bacteria, virus, or fungus. Sepsis can result from an infection in any part of your body. Infections that commonly lead to sepsis include skin, lung, and urinary tract infections. Sepsis is a medical emergency that must be treated right away in a hospital. In severe cases, it can lead to septic shock. Septic shock can weaken your heart and cause your blood pressure to drop. This can cause your central nervous system and your body's organs to stop working. What are the causes? This condition is caused by a severe reaction to infections from bacteria, viruses, or fungus. The germs that most often lead to sepsis include: Escherichia coli (E. coli) bacteria. Staphylococcus aureus (staph) bacteria. Some types of Streptococcus bacteria. The most common infections affect these organs: The lung (pneumonia). The kidneys or bladder (urinary tract infection). The skin (cellulitis). The bowel, gallbladder, or pancreas. What increases the risk? You are more likely to develop this condition if: Your body's disease-fighting system (immune system) is weakened. You are age 78 or older. You are male. You had surgery or you have been hospitalized. You have these devices inserted into your body: A small, thin tube (catheter). IV line. Breathing tube. Drainage tube. You are not getting enough nutrients from food (malnourished). You have a chronic disease, such as cancer, lung disease, kidney disease, or diabetes. What are the signs or symptoms? Symptoms of this condition may include: Fever. Chills or feeling very cold. Confusion or anxiety. Fatigue. Muscle aches. Shortness of breath or rapid breathing (hyperventilation). Nausea and vomiting. Urinating much less than usual. Fast heart rate. Changes in skin color. Your skin may look blotchy, pale, or blue. Cool, clammy, or  sweaty skin. Skin rash. Other symptoms depend on the source of your infection. How is this diagnosed? This condition is diagnosed based on your symptoms, medical history, and physical exam. Other tests may also be done to find out the cause of the infection and how severe the sepsis is. Tests may include: Blood tests. Urine tests. Swabs from other areas of your body that may have an infection. These samples may be tested (cultured) to find out what type of bacteria is causing the infection. Chest X-ray to check for pneumonia. Other imaging tests, such as a CT scan, may also be done. Lumbar puncture. This removes a small amount of the fluid that surrounds your brain and spinal cord. The fluid is then examined for infection. How is this treated? This condition must be treated in a hospital. Based on the cause of your infection, you may be given an antibiotic, antiviral, or antifungal medicine. You may also receive: Fluids through an IV. Oxygen and breathing assistance. Medicines to increase your blood pressure. Kidney dialysis. This process cleans your blood if your kidneys have failed. Surgery to remove infected tissue. Blood transfusion if needed. Medicine to prevent blood clots. Nutrients to correct imbalances in basic body function (metabolism). You may: Receive important salts and minerals (electrolytes) through an IV. Have your blood sugar level adjusted. Follow these instructions at home: Medicines  Take over-the-counter and prescription medicines only as told by your health care provider. If you were prescribed an antibiotic, antiviral, or antifungal medicine, take it as told by your health care provider. Do not stop taking the medicine even if you start to feel better. General instructions If you have a catheter or other indwelling device, ask to have it removed  as soon as possible. Keep all follow-up visits. This is important. Contact a health care provider if: You do not feel  like you are getting better or regaining strength. You are having trouble coping with your recovery. You frequently feel tired. You feel worse or do not seem to get better after surgery. You think you may have an infection after surgery. Get help right away if: You have any symptoms of sepsis. You have difficulty breathing. You have a rapid or skipping heartbeat. You become confused or disoriented. You have a high fever. Your skin becomes blotchy, pale, or blue. You have an infection that is getting worse or not getting better. These symptoms may represent a serious problem that is an emergency. Do not wait to see if the symptoms will go away. Get medical help right away. Call your local emergency services (911 in the [place] Do not drive yourself to the hospital. Summary Sepsis is a medical emergency that requires immediate treatment in a hospital. This condition is caused by a severe reaction to infections from bacteria, viruses, or fungus. Based on the cause of your infection, you may be given an antibiotic, antiviral, or antifungal medicine. Treatment may also include IV fluids, breathing assistance, and kidney dialysis. This information is not intended to replace advice given to you by your health care provider. Make sure you discuss any questions you have with your health care provider. Document Revised: 02/24/2022 Document Reviewed: 02/24/2022 Elsevier Patient Education  2024 [employer].

## 2023-06-06 ENCOUNTER — Other Ambulatory Visit: Payer: Self-pay | Admitting: Family

## 2023-06-06 LAB — CMP14+EGFR
ALT: 18 IU/L (ref 0–44)
AST: 16 IU/L (ref 0–40)
Albumin: 3.6 g/dL — ABNORMAL LOW (ref 3.8–4.8)
Alkaline Phosphatase: 166 IU/L — ABNORMAL HIGH (ref 44–121)
BUN/Creatinine Ratio: 15 (ref 10–24)
BUN: 11 mg/dL (ref 8–27)
Bilirubin Total: 0.3 mg/dL (ref 0.0–1.2)
CO2: 27 mmol/L (ref 20–29)
Calcium: 9.7 mg/dL (ref 8.6–10.2)
Chloride: 96 mmol/L (ref 96–106)
Creatinine, Ser: 0.73 mg/dL — ABNORMAL LOW (ref 0.76–1.27)
Globulin, Total: 3.1 g/dL (ref 1.5–4.5)
Glucose: 158 mg/dL — ABNORMAL HIGH (ref 70–99)
Potassium: 4.5 mmol/L (ref 3.5–5.2)
Sodium: 140 mmol/L (ref 134–144)
Total Protein: 6.7 g/dL (ref 6.0–8.5)
eGFR: 94 mL/min/{1.73_m2} (ref >59)

## 2023-06-06 LAB — CBC WITH DIFFERENTIAL/PLATELET
Basophils Absolute: 0.1 10*3/uL (ref 0.0–0.2)
Basos: 1 %
EOS (ABSOLUTE): 0.2 10*3/uL (ref 0.0–0.4)
Eos: 2 %
Hematocrit: 39 % (ref 37.5–51.0)
Hemoglobin: 13.1 g/dL (ref 13.0–17.7)
Immature Grans (Abs): 0 10*3/uL (ref 0.0–0.1)
Immature Granulocytes: 0 %
Lymphocytes Absolute: 0.5 10*3/uL — ABNORMAL LOW (ref 0.7–3.1)
Lymphs: 6 %
MCH: 32.2 pg (ref 26.6–33.0)
MCHC: 33.6 g/dL (ref 31.5–35.7)
MCV: 96 fL (ref 79–97)
Monocytes Absolute: 0.7 10*3/uL (ref 0.1–0.9)
Monocytes: 8 %
Neutrophils Absolute: 7 10*3/uL (ref 1.4–7.0)
Neutrophils: 83 %
Platelets: 377 10*3/uL (ref 150–450)
RBC: 4.07 x10E6/uL — ABNORMAL LOW (ref 4.14–5.80)
RDW: 13.6 % (ref 11.6–15.4)
WBC: 8.4 10*3/uL (ref 3.4–10.8)

## 2023-06-10 ENCOUNTER — Telehealth: Payer: Self-pay

## 2023-06-10 NOTE — Telephone Encounter (Signed)
 Copied from CRM [id number]. Topic: Clinical - Lab/Test Results >> Jun 10, 2023 12:35 PM Higinio Roger wrote: Reason for CRM: Patient is requesting a callback from [place] to discuss lab results. Callback #: [phone]

## 2023-06-11 ENCOUNTER — Telehealth: Payer: Self-pay

## 2023-06-11 NOTE — Telephone Encounter (Signed)
 Left message making pt aware of lab results. Advised to call back if needed.

## 2023-06-11 NOTE — Telephone Encounter (Signed)
 Copied from CRM [id number]. Topic: General - Other >> Jun 11, 2023 10:20 AM Macon Large wrote: Reason for CRM: Patient returned call to [facility]. Called [facility] but Morrie Sheldon had left to go to another office. Please return the patient's call

## 2023-06-11 NOTE — Telephone Encounter (Signed)
 Lmtcb

## 2023-06-12 DIAGNOSIS — I5033 Acute on chronic diastolic (congestive) heart failure: Secondary | ICD-10-CM | POA: Diagnosis not present

## 2023-06-12 DIAGNOSIS — D62 Acute posthemorrhagic anemia: Secondary | ICD-10-CM | POA: Diagnosis not present

## 2023-06-12 DIAGNOSIS — E1142 Type 2 diabetes mellitus with diabetic polyneuropathy: Secondary | ICD-10-CM | POA: Diagnosis not present

## 2023-06-12 DIAGNOSIS — I4891 Unspecified atrial fibrillation: Secondary | ICD-10-CM | POA: Diagnosis not present

## 2023-06-12 DIAGNOSIS — E1159 Type 2 diabetes mellitus with other circulatory complications: Secondary | ICD-10-CM | POA: Diagnosis not present

## 2023-06-12 DIAGNOSIS — I152 Hypertension secondary to endocrine disorders: Secondary | ICD-10-CM | POA: Diagnosis not present

## 2023-06-18 DIAGNOSIS — I152 Hypertension secondary to endocrine disorders: Secondary | ICD-10-CM | POA: Diagnosis not present

## 2023-06-18 DIAGNOSIS — E1159 Type 2 diabetes mellitus with other circulatory complications: Secondary | ICD-10-CM | POA: Diagnosis not present

## 2023-06-18 DIAGNOSIS — D62 Acute posthemorrhagic anemia: Secondary | ICD-10-CM | POA: Diagnosis not present

## 2023-06-18 DIAGNOSIS — I5033 Acute on chronic diastolic (congestive) heart failure: Secondary | ICD-10-CM | POA: Diagnosis not present

## 2023-06-18 DIAGNOSIS — I4891 Unspecified atrial fibrillation: Secondary | ICD-10-CM | POA: Diagnosis not present

## 2023-06-18 DIAGNOSIS — E1142 Type 2 diabetes mellitus with diabetic polyneuropathy: Secondary | ICD-10-CM | POA: Diagnosis not present

## 2023-06-25 DIAGNOSIS — E1142 Type 2 diabetes mellitus with diabetic polyneuropathy: Secondary | ICD-10-CM | POA: Diagnosis not present

## 2023-06-25 DIAGNOSIS — I152 Hypertension secondary to endocrine disorders: Secondary | ICD-10-CM | POA: Diagnosis not present

## 2023-06-25 DIAGNOSIS — E1159 Type 2 diabetes mellitus with other circulatory complications: Secondary | ICD-10-CM | POA: Diagnosis not present

## 2023-06-25 DIAGNOSIS — I4891 Unspecified atrial fibrillation: Secondary | ICD-10-CM | POA: Diagnosis not present

## 2023-06-25 DIAGNOSIS — D62 Acute posthemorrhagic anemia: Secondary | ICD-10-CM | POA: Diagnosis not present

## 2023-06-25 DIAGNOSIS — I5033 Acute on chronic diastolic (congestive) heart failure: Secondary | ICD-10-CM | POA: Diagnosis not present

## 2023-06-29 ENCOUNTER — Inpatient Hospital Stay: Payer: Medicare Other | Attending: Hematology

## 2023-06-29 ENCOUNTER — Ambulatory Visit (HOSPITAL_COMMUNITY)
Admission: RE | Admit: 2023-06-29 | Discharge: 2023-06-29 | Disposition: A | Discharge status: Home / Self Care | Payer: Medicare Other | Source: Ambulatory Visit | Attending: Hematology | Admitting: Hematology

## 2023-06-29 ENCOUNTER — Ambulatory Visit: Payer: Medicare Other

## 2023-06-29 DIAGNOSIS — C3491 Malignant neoplasm of unspecified part of right bronchus or lung: Secondary | ICD-10-CM

## 2023-06-29 DIAGNOSIS — J439 Emphysema, unspecified: Secondary | ICD-10-CM | POA: Diagnosis not present

## 2023-06-29 DIAGNOSIS — D509 Iron deficiency anemia, unspecified: Secondary | ICD-10-CM | POA: Diagnosis not present

## 2023-06-29 DIAGNOSIS — R918 Other nonspecific abnormal finding of lung field: Secondary | ICD-10-CM | POA: Insufficient documentation

## 2023-06-29 DIAGNOSIS — J90 Pleural effusion, not elsewhere classified: Secondary | ICD-10-CM | POA: Diagnosis not present

## 2023-06-29 DIAGNOSIS — I483 Typical atrial flutter: Secondary | ICD-10-CM | POA: Diagnosis not present

## 2023-06-29 DIAGNOSIS — Z85118 Personal history of other malignant neoplasm of bronchus and lung: Secondary | ICD-10-CM | POA: Insufficient documentation

## 2023-06-29 DIAGNOSIS — F17210 Nicotine dependence, cigarettes, uncomplicated: Secondary | ICD-10-CM | POA: Diagnosis not present

## 2023-06-29 DIAGNOSIS — I700 Atherosclerosis of aorta: Secondary | ICD-10-CM | POA: Diagnosis not present

## 2023-06-29 DIAGNOSIS — C3492 Malignant neoplasm of unspecified part of left bronchus or lung: Secondary | ICD-10-CM | POA: Diagnosis not present

## 2023-06-29 LAB — IRON AND TIBC
Iron: 83 ug/dL (ref 45–182)
Saturation Ratios: 25 % (ref 17.9–39.5)
TIBC: 332 ug/dL (ref 250–450)
UIBC: 249 ug/dL

## 2023-06-29 LAB — COMPREHENSIVE METABOLIC PANEL
ALT: 19 U/L (ref 0–44)
AST: 22 U/L (ref 15–41)
Albumin: 3.4 g/dL — ABNORMAL LOW (ref 3.5–5.0)
Alkaline Phosphatase: 118 U/L (ref 38–126)
Anion gap: 10 (ref 5–15)
BUN: 22 mg/dL (ref 8–23)
CO2: 28 mmol/L (ref 22–32)
Calcium: 9.5 mg/dL (ref 8.9–10.3)
Chloride: 98 mmol/L (ref 98–111)
Creatinine, Ser: 0.92 mg/dL (ref 0.61–1.24)
GFR, Estimated: >60 mL/min (ref >60)
Glucose, Bld: 179 mg/dL — ABNORMAL HIGH (ref 70–99)
Potassium: 5 mmol/L (ref 3.5–5.1)
Sodium: 136 mmol/L (ref 135–145)
Total Bilirubin: 0.6 mg/dL (ref 0.0–1.2)
Total Protein: 7.7 g/dL (ref 6.5–8.1)

## 2023-06-29 LAB — CBC WITH DIFFERENTIAL/PLATELET
Abs Immature Granulocytes: 0.04 10*3/uL (ref 0.00–0.07)
Basophils Absolute: 0.1 10*3/uL (ref 0.0–0.1)
Basophils Relative: 1 %
Eosinophils Absolute: 0.3 10*3/uL (ref 0.0–0.5)
Eosinophils Relative: 2 %
HCT: 43.2 % (ref 39.0–52.0)
Hemoglobin: 13.7 g/dL (ref 13.0–17.0)
Immature Granulocytes: 0 %
Lymphocytes Relative: 6 %
Lymphs Abs: 0.7 10*3/uL (ref 0.7–4.0)
MCH: 31.6 pg (ref 26.0–34.0)
MCHC: 31.7 g/dL (ref 30.0–36.0)
MCV: 99.5 fL (ref 80.0–100.0)
Monocytes Absolute: 0.8 10*3/uL (ref 0.1–1.0)
Monocytes Relative: 7 %
Neutro Abs: 9.2 10*3/uL — ABNORMAL HIGH (ref 1.7–7.7)
Neutrophils Relative %: 84 %
Platelets: 331 10*3/uL (ref 150–400)
RBC: 4.34 MIL/uL (ref 4.22–5.81)
RDW: 14.3 % (ref 11.5–15.5)
WBC: 11 10*3/uL — ABNORMAL HIGH (ref 4.0–10.5)
nRBC: 0 % (ref 0.0–0.2)

## 2023-06-29 LAB — CUP PACEART REMOTE DEVICE CHECK
Date Time Interrogation Session: 20250324032700
Pulse Gen Serial Number: 104324

## 2023-06-29 LAB — FERRITIN: Ferritin: 185 ng/mL (ref 24–336)

## 2023-06-29 MED ORDER — HEPARIN SOD (PORK) LOCK FLUSH 100 UNIT/ML IV SOLN: 500.0000 [IU] | Freq: Once | INTRAVENOUS | Status: DC

## 2023-06-29 MED ORDER — HEPARIN SOD (PORK) LOCK FLUSH 100 UNIT/ML IV SOLN
INTRAVENOUS | Status: AC
  Filled 2023-06-29: qty 5

## 2023-06-29 MED ORDER — HEPARIN SOD (PORK) LOCK FLUSH 100 UNIT/ML IV SOLN
500.0000 [IU] | Freq: Once | INTRAVENOUS | Status: AC
  Administered 2023-06-29: 500 [IU] via INTRAVENOUS

## 2023-06-29 MED ORDER — IOHEXOL 300 MG/ML  SOLN
80.0000 mL | Freq: Once | INTRAMUSCULAR | Status: AC | PRN
  Administered 2023-06-29: 80 mL via INTRAVENOUS

## 2023-06-29 MED ORDER — SODIUM CHLORIDE 0.9% FLUSH
10.0000 mL | INTRAVENOUS | Status: DC | PRN
  Administered 2023-06-29: 10 mL via INTRAVENOUS

## 2023-06-29 NOTE — Progress Notes (Signed)
 Patient presents today for port flush with labs per provider's orders. Patient flushed easily with good blood return and no swelling or bruising at the site. Patient remains accessed for CT scan.

## 2023-06-30 ENCOUNTER — Ambulatory Visit

## 2023-06-30 DIAGNOSIS — I4892 Unspecified atrial flutter: Secondary | ICD-10-CM | POA: Diagnosis not present

## 2023-06-30 DIAGNOSIS — E1142 Type 2 diabetes mellitus with diabetic polyneuropathy: Secondary | ICD-10-CM | POA: Diagnosis not present

## 2023-06-30 DIAGNOSIS — I152 Hypertension secondary to endocrine disorders: Secondary | ICD-10-CM | POA: Diagnosis not present

## 2023-06-30 DIAGNOSIS — E1159 Type 2 diabetes mellitus with other circulatory complications: Secondary | ICD-10-CM

## 2023-06-30 DIAGNOSIS — I5033 Acute on chronic diastolic (congestive) heart failure: Secondary | ICD-10-CM | POA: Diagnosis not present

## 2023-06-30 DIAGNOSIS — I4891 Unspecified atrial fibrillation: Secondary | ICD-10-CM | POA: Diagnosis not present

## 2023-06-30 DIAGNOSIS — C3491 Malignant neoplasm of unspecified part of right bronchus or lung: Secondary | ICD-10-CM

## 2023-06-30 DIAGNOSIS — E1169 Type 2 diabetes mellitus with other specified complication: Secondary | ICD-10-CM

## 2023-06-30 DIAGNOSIS — J9611 Chronic respiratory failure with hypoxia: Secondary | ICD-10-CM

## 2023-06-30 DIAGNOSIS — D630 Anemia in neoplastic disease: Secondary | ICD-10-CM | POA: Diagnosis not present

## 2023-06-30 DIAGNOSIS — E782 Mixed hyperlipidemia: Secondary | ICD-10-CM

## 2023-06-30 DIAGNOSIS — I700 Atherosclerosis of aorta: Secondary | ICD-10-CM | POA: Diagnosis not present

## 2023-06-30 NOTE — Addendum Note (Signed)
 Addended by: Geralyn Flash D on: 06/30/2023 01:32 PM   Modules accepted: Orders

## 2023-06-30 NOTE — Progress Notes (Signed)
 Bsx Loop Recorder

## 2023-07-04 ENCOUNTER — Encounter: Payer: Self-pay | Admitting: Cardiology

## 2023-07-06 ENCOUNTER — Inpatient Hospital Stay (HOSPITAL_BASED_OUTPATIENT_CLINIC_OR_DEPARTMENT_OTHER): Payer: Medicare Other | Admitting: Hematology

## 2023-07-06 VITALS — BP 121/63 | HR 101 | Temp 98.1°F | Resp 20 | Wt 146.6 lb

## 2023-07-06 DIAGNOSIS — D509 Iron deficiency anemia, unspecified: Secondary | ICD-10-CM | POA: Diagnosis not present

## 2023-07-06 DIAGNOSIS — F17210 Nicotine dependence, cigarettes, uncomplicated: Secondary | ICD-10-CM | POA: Diagnosis not present

## 2023-07-06 DIAGNOSIS — C3491 Malignant neoplasm of unspecified part of right bronchus or lung: Secondary | ICD-10-CM

## 2023-07-06 DIAGNOSIS — Z85118 Personal history of other malignant neoplasm of bronchus and lung: Secondary | ICD-10-CM | POA: Diagnosis not present

## 2023-07-06 DIAGNOSIS — R918 Other nonspecific abnormal finding of lung field: Secondary | ICD-10-CM | POA: Diagnosis not present

## 2023-07-06 NOTE — Patient Instructions (Addendum)
 [facility] at [facility] Discharge Instructions   You were seen and examined today by Dr. Ellin Saba.  He reviewed the results of your lab work which are normal/stable.   He reviewed the results of your CT scan which shows there is a spot that has slightly increased in size. There is also an area that was treated with radiation that has gotten bigger. Dr. Kirtland Bouchard recommends a PET scan to see if these areas light up.   We will see you back after the PET.   Return as scheduled.    Thank you for choosing [facility] at [facility] to provide your oncology and hematology care.  To afford each patient quality time with our provider, please arrive at least 15 minutes before your scheduled appointment time.   If you have a lab appointment with the Cancer Center please come in thru the Main Entrance and check in at the main information desk.  You need to re-schedule your appointment should you arrive 10 or more minutes late.  We strive to give you quality time with our providers, and arriving late affects you and other patients whose appointments are after yours.  Also, if you no show three or more times for appointments you may be dismissed from the clinic at the providers discretion.     Again, thank you for choosing [facility].  Our hope is that these requests will decrease the amount of time that you wait before being seen by our physicians.       _____________________________________________________________  Should you have questions after your visit to [facility], please contact our office at [phone] and follow the prompts.  Our office hours are 8:00 a.m. and 4:30 p.m. Monday - Friday.  Please note that voicemails left after 4:00 p.m. may not be returned until the following business day.  We are closed weekends and major holidays.  You do have access to a nurse 24-7, just call the main number to the clinic [phone]  and do not press any options, hold on the line and a nurse will answer the phone.    For prescription refill requests, have your pharmacy contact our office and allow 72 hours.    Due to Covid, you will need to wear a mask upon entering the hospital. If you do not have a mask, a mask will be given to you at the Main Entrance upon arrival. For doctor visits, patients may have 1 support person age 44 or older with them. For treatment visits, patients can not have anyone with them due to social distancing guidelines and our immunocompromised population.

## 2023-07-06 NOTE — Progress Notes (Signed)
 [facility] [address] [place] [address]    Clinic Day:  07/06/23   Referring physician: Junie Spencer, FNP  Patient Care Team: Junie Spencer, FNP as PCP - General (Family Medicine) O'Neal, Ronnald Ramp, MD as PCP - Cardiology (Cardiology) Lanier Prude, MD as PCP - Electrophysiology (Cardiology) Doreatha Massed, MD as Consulting Physician (Hematology) Danella Maiers, [facility] (Pharmacist) O'Neal, Ronnald Ramp, MD as Consulting Physician (Cardiology) McKenzie, Mardene Celeste, MD as Consulting Physician (Urology) Delora Fuel, OD (Optometry) Dani Gobble, NP as Nurse Practitioner (Endocrinology) Adam Phenix, DPM as Consulting Physician (Podiatry) Cephus Shelling, MD as Consulting Physician (Vascular Surgery)   ASSESSMENT & PLAN:   Assessment: 1.  Stage IIIa (T1b N2 cM0) NSCLC, favoring adenocarcinoma: -Chemo XRT with weekly carboplatin and paclitaxel completed on 08/20/2016. -Consolidation durvalumab from 09/26/2016 through 09/25/2017. -Reviewed CT chest on 11/07/2019 which showed increasing size of the upper lobe pulmonary nodules with no new nodule in the left upper lobe suspicious for meta stasis.  Also increasing confluent soft tissue in the right suprahilar region, thought to be postradiation changes. -PET scan on 11/21/2019 showed suspected radiation changes in the right paramediastinal/perihilar region although mild hypermetabolism is present in the right retroareolar region.SUV 2.5.  While progressive soft tissue is present in this region, this hypermetabolism remains nonfocal and favors radiation changes.  7 mm central left upper lobe nodule max SUV 1.4.  9 mm aggregate nodule in the lateral right upper lobe max SUV 2.4.  These are minimally progressive from more remote prior scans.  6 mm left lower lobe nodule unchanged.  No hypermetabolic thoracic adenopathy. -CT chest on 03/23/2020 showed many of the lung nodules are stable.  Couple  of nodules have grown by few millimeters.  No new areas seen. - SBRT to the right and left lung lesions, 54 Gray in 3 fractions from 09/10/2020 through 09/17/2020. - SBRT to the RUL and LUL lesions from 06/19/2022 through 06/26/2022 by Dr. Roselind Messier.   2.  Normocytic to macrocytic anemia: - Found to have hemoglobin 6.3 on 10/09/2020 and received 2 units PRBC while hospitalized.  I have reviewed hospitalization records. - EGD on 09/06/2020 with tiny gastric ulcer without bleeding or stigmata. - Blood loss this year most likely postoperative in nature (urological surgery in February and vascular surgery in May).    Plan: 1.  Stage IIIa (T1b N2 cM0) NSCLC, favoring adenocarcinoma: - CT chest (03/24/2023): Right lower lobe nodule 1.8 x 1.8 cm, previously 1.1 x 1.1 cm in the right infrahilar region.  Left upper lobe radiation changes present. - CT chest (06/29/2023): Right lower lobe nodule measures 2.4 x 1.4 cm with central necrosis.  Previously measured 1.8 x 1.4 cm.  Chronic treatment changes present in the left upper lobe with interval increased soft tissue density measuring 4.2 x 3.5 cm.  This is concerning for local recurrence.  Unchanged 0.7 cm left upper lobe nodule.  Interval resolution of right-sided pleural effusion.  No evidence of adenopathy. - He had recent hospitalization from 05/29/2023 through 05/31/2023 with fever and SIRS.  He was treated with Rocephin which was transitioned to Ceftin.  He lost about 8 pounds since hospitalization due to decreased appetite.  Lately his appetite is picking up and he gained back about 2 pounds. - I have reviewed CT scan images with the patient.  Recommend PET CT scan for further evaluation of increasing lesions in the lungs.  RTC after the PET scan.   2.  Normocytic to macrocytic anemia: - Received Monoferric on 04/10/2023. - Labs on 06/29/2023 shows hemoglobin improved to 13.7.  Ferritin is 185 and percent saturation of 25.  No indication for parenteral iron therapy.     No orders of the defined types were placed in this encounter.     Alben Deeds Teague,acting as a Neurosurgeon for Doreatha Massed, MD.,have documented all relevant documentation on the behalf of Doreatha Massed, MD,as directed by  Doreatha Massed, MD while in the presence of Doreatha Massed, MD.  I, Doreatha Massed MD, have reviewed the above documentation for accuracy and completeness, and I agree with the above.      Doreatha Massed, MD   3/31/20253:12 PM  CHIEF COMPLAINT:   Diagnosis: Right NSCLC    Cancer Staging  Non-small cell lung cancer, right [facility]) Staging form: Lung, AJCC 8th Edition - Clinical stage from 06/05/2016: Stage IIIA (cT1b(2), cN2, cM0) - Signed by Ellouise Newer, PA-C on 08/21/2016    Prior Therapy: 1. Chemoradiation with weekly carboplatin and paclitaxel, 06/25/16 - 08/20/16. 2. Consolidation durvalumab, 09/26/16 - 09/25/17. 3. SBRT to RUL, LUL nodules 09/10/20 - 09/17/20 4. SBRT to RUL, LUL nodules 06/19/22 - 06/26/22  Current Therapy:  surveillance    HISTORY OF PRESENT ILLNESS:   Oncology History  Non-small cell lung cancer, right (HCC)  04/17/2016 Imaging   CT chest- 1. Examination is positive for bilateral upper lobe pulmonary lesions suspicious for malignancy. Further investigation with PET-CT and tissue sampling is advised. 2. Borderline enlarged paratracheal lymph nodes and right hilar nodes. Cannot rule out metastatic adenopathy. Attention to these areas on PET-CT is advised. 3. Emphysema 4. Aortic atherosclerosis and multi vessel coronary artery calcification.   04/18/2016 Initial Diagnosis   Adenocarcinoma of lung, right (HCC)   04/24/2016 PET scan   1. Adjacent hypermetabolic nodules in the RIGHT upper lobe consistent bronchogenic carcinoma. 2. Suspicion of ipsilateral hilar and paratracheal nodal metastasis. 3. Nodule in the LEFT upper lobe with suspicious morphology has a relatively low metabolic activity for size.  Favor synchronous primary bronchogenic carcinoma. 4. No metabolic activity associated with a small LEFT lower lobe pulmonary nodule. Recommend attention on follow-up.   05/06/2016 Imaging   MRI brain- Negative for metastatic disease.   05/28/2016 Procedure   1. Electromagnetic navigational bronchoscopy with brushings, needle     aspirations and biopsies of bilateral upper lobe nodules. 2. Endobronchial ultrasound with mediastinal and hilar lymph node     aspirations. By Dr. Dorris Fetch   06/03/2016 Pathology Results   Diagnosis 1. Lung, biopsy, Right upper lobe - BLOOD AND BENIGN BRONCHIAL AND LUNG TISSUE. 2. Lung, biopsy, Left upper lobe - BLOOD AND BENIGN BRONCHIAL AND LUNG TISSUE.   06/03/2016 Pathology Results   Diagnosis TRANSBRONCHIAL NEEDLE ASPIRATION, NAVIGATION, RUL (SPECIMEN 5 OF 7, COLLECTED 05/28/16): MALIGNANT CELLS CONSISTENT WITH NON-SMALL CELL CARCINOMA Comment There are limited tumor cells in the cell block (insufficient for molecular testing). TTF-1 is positive in a few of the atypical cells. They appear negative for synaptophysin and cytokeratin 5/6. Overall there is limited tumor, but a lung adenocarcinoma is slightly favored. Dr. Luisa Hart has reviewed the case. The case was called to Dr. Dorris Fetch on 06/03/2016.   06/13/2016 Procedure   Port placed by Dr. Lovell Sheehan   06/25/2016 - 08/15/2016 Chemotherapy   The patient had palonosetron (ALOXI) injection 0.25 mg, 0.25 mg, Intravenous,  Once, 6 of 6 cycles  CARBOplatin (PARAPLATIN) 220 mg in sodium chloride 0.9 % 250 mL chemo infusion, 220 mg (100 %  of original dose 215.8 mg), Intravenous,  Once, 6 of 6 cycles Dose modification:   (original dose 215.8 mg, Cycle 1),   (original dose 215.8 mg, Cycle 5),   (original dose 215.8 mg, Cycle 6),   (original dose 215.8 mg, Cycle 2),   (original dose 215.8 mg, Cycle 3),   (original dose 215.8 mg, Cycle 4)  PACLitaxel (TAXOL) 90 mg in dextrose 5 % 250 mL chemo infusion ( for  chemotherapy treatment.     06/25/2016 - 08/20/2016 Radiation Therapy   Radiation in [place], [place] by Dr. Louis Matte.  R lung to 6600 cGy   07/09/2016 - 07/10/2016 Hospital Admission   Admit date: 07/09/2016  Admission diagnosis: Fever and chills Additional comments: Negative work-up, discharged with course of Levaquin   07/09/2016 Treatment Plan Change   Treatment deferred x 1 week due to hospitalization    08/05/2016 - 08/06/2016 Hospital Admission   Admit date: 08/05/2016 Admission diagnosis: Joint pain Additional comments: Treated with steroids with symptomatic improvement   08/25/2016 Imaging   MRI brain- Negative for intracranial metastasis on this motion degraded examination.  Stable mild-to-moderate chronic small vessel ischemic disease.   09/18/2016 PET scan   1. Partial metabolic response. Right upper lobe 2.6 cm hypermetabolic pulmonary nodule, decreased in size and metabolism. Mildly hypermetabolic right hilar nodal metastasis, decreased in metabolism. Right paratracheal lymph node is no longer hypermetabolic. No new or progressive metastatic disease. 2. Apical left upper lobe 0.9 cm solid pulmonary nodule, slightly decreased in size, with no significant metabolism. The change in size could indicate a neoplastic etiology for this nodule. 3. Additional subcentimeter pulmonary nodules in the left lung are stable and below PET resolution . 4. Additional findings include aortic atherosclerosis, coronary atherosclerosis, moderate emphysema, stable small pericardial effusion/thickening and mild sigmoid diverticulosis.   01/13/2017 Imaging   CT chest with contrast IMPRESSION: 1. No change in size of the hypermetabolic nodular thickening in the RIGHT upper lobe. 2. LEFT upper lobe spiculated nodule is stable. 3. Rounded LEFT lower lobe pulmonary nodule is stable. 4. No evidence of lung cancer progression.   05/08/2017 Adverse Reaction   Development of Hypothyroidism- likely secondary to  immunotherapy.  Levothyroxine therapy initiated.       INTERVAL HISTORY:   John [name] is a 78 y.o. male presenting to clinic today for follow up of right NSCLC. He was last seen by me on 03/31/23.  Since his last visit, he was admitted to the hospital from 05/30/23 to 05/31/23 for systemic inflammatory response syndrome, treated with Rocephin.   He had a CT chest on 06/29/23 that found:  Interval increased soft tissue density within the treatment bed in the left upper lobe, concerning for local recurrence. Slight interval increase in size of necrotic nodule in the superior segment of the right lower lobe, also suspicious for tumor progression. Stable additional small left lung nodules. Interval resolution of right-sided pleural effusion. No evidence of metastatic adenopathy or acute thoracic findings. Aortic Atherosclerosis  Today, he states that he is doing well overall. His appetite level is at 60%. His energy level is at 70%.   PAST MEDICAL HISTORY:   Past 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   Anemia    Anxiety    Arthritis    Asthma    Atrial flutter (HCC)    BPH (benign prostatic hyperplasia)    with urinary retention 02/06/20   CHF (congestive heart failure) (HCC)  COPD (chronic obstructive pulmonary disease) (HCC)    Depression    Diabetes mellitus, type II (HCC)    Dyspnea    History of kidney stones    History of radiation therapy    right lung 09/10/2020-09/17/2020   Dr Roselind Messier   History of radiation therapy    Left and right Lung- 06/19/22-06/26/22   Hyperlipidemia    Hypertension    Hypothyroidism    Macular degeneration    Neuropathy    Non-small cell lung cancer, right (HCC) 04/18/2016   Peripheral vascular disease (HCC)    Prostatitis    Pulmonary nodule, left 07/16/2016   Sleep apnea    cpap    Surgical History: Past Surgical History:  Procedure Laterality Date   A-FLUTTER ABLATION N/A  01/13/2022   Procedure: A-FLUTTER ABLATION;  Surgeon: Lanier Prude, MD;  Location: [facility];  Service: Cardiovascular;  Laterality: N/A;   ABDOMINAL AORTOGRAM W/LOWER EXTREMITY Left 02/06/2020   Procedure: ABDOMINAL AORTOGRAM W/LOWER EXTREMITY;  Surgeon: Runell Gess, MD;  Location: [facility];  Service: Cardiovascular;  Laterality: Left;   ABDOMINAL AORTOGRAM W/LOWER EXTREMITY N/A 05/30/2021   Procedure: ABDOMINAL AORTOGRAM W/LOWER EXTREMITY;  Surgeon: Cephus Shelling, MD;  Location: [facility];  Service: Cardiovascular;  Laterality: N/A;   ABDOMINAL AORTOGRAM W/LOWER EXTREMITY N/A 02/05/2023   Procedure: ABDOMINAL AORTOGRAM W/LOWER EXTREMITY;  Surgeon: Cephus Shelling, MD;  Location: [facility];  Service: Cardiovascular;  Laterality: N/A;   BIOPSY  09/16/2021   Procedure: BIOPSY;  Surgeon: Iva Boop, MD;  Location[facility] ENDOSCOPY;  Service: Gastroenterology;;   CARDIOVERSION N/A 10/05/2020   Procedure: CARDIOVERSION;  Surgeon: Sande Rives, MD;  Location: [facility] ENDOSCOPY;  Service: Cardiovascular;  Laterality: N/A;   CATARACT EXTRACTION, BILATERAL Bilateral    COLONOSCOPY WITH PROPOFOL N/A 06/20/2021   Procedure: COLONOSCOPY WITH PROPOFOL;  Surgeon: Hilarie Fredrickson, MD;  Location: [facility] ENDOSCOPY;  Service: Endoscopy;  Laterality: N/A;   COLONOSCOPY WITH PROPOFOL N/A 09/16/2021   Procedure: COLONOSCOPY WITH PROPOFOL;  Surgeon: Iva Boop, MD;  Location: [facility] ENDOSCOPY;  Service: Gastroenterology;  Laterality: N/A;   ENDARTERECTOMY FEMORAL Right 08/20/2020   Procedure: ENDARTERECTOMY  RIGHT FEMORAL ARTERY;  Surgeon: Cephus Shelling, MD;  Location[facility] OR;  Service: Vascular;  Laterality: Right;   ENTEROSCOPY N/A 10/11/2021   Procedure: ENTEROSCOPY;  Surgeon: Dolores Frame, MD;  Location: [facility] ENDO SUITE;  Service: Gastroenterology;  Laterality: N/A;   ENTEROSCOPY N/A 02/18/2023   Procedure: ENTEROSCOPY;  Surgeon: Corbin Ade, MD;  Location: [facility] ENDO SU[facility];  Service: Endoscopy;  Laterality: N/A;  EGD with possible small bowel push enteroscopy   ESOPHAGOGASTRODUODENOSCOPY N/A 09/16/2021   Procedure: ESOPHAGOGASTRODUODENOSCOPY (EGD);  Surgeon: Iva Boop, MD;  Location: [facility] ENDOSCOPY;  Service: Gastroenterology;  Laterality: N/A;   ESOPHAGOGASTRODUODENOSCOPY (EGD) WITH PROPOFOL N/A 09/06/2020   Procedure: ESOPHAGOGASTRODUODENOSCOPY (EGD) WITH PROPOFOL;  Surgeon: Hilarie Fredrickson, MD;  Location: [facility] ENDOSCOPY;  Service: Endoscopy;  Laterality: N/A;   ESOPHAGOGASTRODUODENOSCOPY (EGD) WITH PROPOFOL N/A 10/11/2021   Procedure: ESOPHAGOGASTRODUODENOSCOPY (EGD) WITH PROPOFOL;  Surgeon: Dolores Frame, MD;  Location: [facility] SU[facility];  Service: Gastroenterology;  Laterality: N/A;   ESOPHAGOGASTRODUODENOSCOPY (EGD) WITH PROPOFOL N/A 02/18/2023   Procedure: ESOPHAGOGASTRODUODENOSCOPY (EGD) WITH PROPOFOL;  Surgeon: Corbin Ade, MD;  Location: [facility];  Service: Endoscopy;  Laterality: N/A;   GIVENS CAPSULE STUDY N/A 02/19/2023   Procedure: GIVENS CAPSULE STUDY;  Surgeon: Franky Macho, MD;  Location: [facility];  Service: Endoscopy;  Laterality: N/A;   HEMOSTASIS CLIP PLACEMENT  09/16/2021   Procedure: HEMOSTASIS CLIP PLACEMENT;  Surgeon: Iva Boop, MD;  Location: [facility] ENDOSCOPY;  Service: Gastroenterology;;   HOT HEMOSTASIS N/A 09/16/2021   Procedure: HOT HEMOSTASIS (ARGON PLASMA COAGULATION/BICAP);  Surgeon: Iva Boop, MD;  Location: [facility] ENDOSCOPY;  Service: Gastroenterology;  Laterality: N/A;   HOT HEMOSTASIS  10/11/2021   Procedure: HOT HEMOSTASIS (ARGON PLASMA COAGULATION/BICAP);  Surgeon: Marguerita Merles, Reuel Boom, MD;  Location: [facility];  Service: Gastroenterology;;   HOT HEMOSTASIS  02/18/2023   Procedure: HOT HEMOSTASIS (ARGON PLASMA COAGULATION/BICAP);  Surgeon: Corbin Ade, MD;  Location: [facility];  Service: Endoscopy;;   INSERTION OF ILIAC STENT Right 08/20/2020   Procedure:  RETROGRADE INSERTION OF RIGHT ILIAC STENT;  Surgeon: Cephus Shelling, MD;  Location: [facility] OR;  Service: Vascular;  Laterality: Right;   INTRAOPERATIVE ARTERIOGRAM Right 08/20/2020   Procedure: INTRA OPERATIVE ARTERIOGRAM ILIAC;  Surgeon: Cephus Shelling, MD;  Location: [facility] OR;  Service: Vascular;  Laterality: Right;   PATCH ANGIOPLASTY Right 08/20/2020   Procedure: PATCH ANGIOPLASTY RIGHT FEMORAL ARTERY;  Surgeon: Cephus Shelling, MD;  Location: [facility] OR;  Service: Vascular;  Laterality: Right;   PERIPHERAL VASCULAR BALLOON ANGIOPLASTY Right 05/30/2021   Procedure: PERIPHERAL VASCULAR BALLOON ANGIOPLASTY;  Surgeon: Cephus Shelling, MD;  [facility];  Service: Cardiovascular;  Laterality: Right;   PERIPHERAL VASCULAR INTERVENTION Right 02/05/2023   Procedure: PERIPHERAL VASCULAR INTERVENTION;  Surgeon: Cephus Shelling, MD;  [facility]: [facility];  Service: Cardiovascular;  Laterality: Right;  Rt SFA and Rt Ext Iliac   PORTACATH PLACEMENT Left 06/13/2016   Procedure: INSERTION PORT-A-CATH;  Surgeon: Franky Macho, MD;  Location: [facility] ORS;  Service: General;  Laterality: Left;   TRANSURETHRAL RESECTION OF PROSTATE N/A 05/31/2020   Procedure: TRANSURETHRAL RESECTION OF THE PROSTATE (TURP);  Surgeon: Malen Gauze, MD;  Location: [facility] ORS;  Service: Urology;  Laterality: N/A;   VIDEO BRONCHOSCOPY WITH ENDOBRONCHIAL NAVIGATION N/A 05/28/2016   Procedure: VIDEO BRONCHOSCOPY WITH ENDOBRONCHIAL NAVIGATION;  Surgeon: Loreli Slot, MD;  Location: [facility] OR;  Service: Thoracic;  Laterality: N/A;   VIDEO BRONCHOSCOPY WITH ENDOBRONCHIAL ULTRASOUND N/A 05/28/2016   Procedure: VIDEO BRONCHOSCOPY WITH ENDOBRONCHIAL ULTRASOUND;  Surgeon: Loreli Slot, MD;  Location: [facility] OR;  Service: Thoracic;  Laterality: N/A;    Social History: Social History   Socioeconomic History   Marital status: Significant Other    Spouse name: Not on file   Number of children: 5   Years  of education: 10   Highest education level: 10th grade  Occupational History   Occupation: Retired  Tobacco Use   Smoking status: Every Day    Current packs/day: 0.50    Average packs/day: 0.5 packs/day for 62.3 years (31.2 ttl pk-yrs)    Types: Cigarettes    Start date: 03/11/1961   Smokeless tobacco: Never   Tobacco comments:    1/2 pack to 1 pack per day 12/14/19  Vaping Use   Vaping status: Never Used  Substance and Sexual Activity   Alcohol use: No   Drug use: No   Sexual activity: Yes    Birth control/protection: None  Other Topics Concern   Not on file  Social History Narrative   John [name] is his POA and lives with him. Children out of town - one in [facility], others in [place].   Social Drivers of Health  Financial Resource Strain: Low Risk  (05/30/2023)   Overall Financial Resource Strain (CARDIA)    Difficulty of Paying Living Expenses: Not very hard  Food Insecurity: No Food Insecurity (05/30/2023)   Hunger Vital Sign    Worried About Running Out of Food in the Last Year: Never true    Ran Out of Food in the Last Year: Never true  Transportation Needs: No Transportation Needs (05/30/2023)   PRAPARE - Administrator, Civil Service (Medical): No    Lack of Transportation (Non-Medical): No  Physical Activity: Insufficiently Active (05/30/2023)   Exercise Vital Sign    Days of Exercise per Week: 3 days    Minutes of Exercise per Session: 10 min  Stress: No Stress Concern Present (05/30/2023)   [employer] of Occupational Health - Occupational Stress Questionnaire    Feeling of Stress : Not at all  Social Connections: Moderately Isolated (05/30/2023)   Social Connection and Isolation Panel [NHANES]    Frequency of Communication with Friends and Family: Twice a week    Frequency of Social Gatherings with Friends and Family: Twice a week    Attends Religious Services: Never    Database administrator or Organizations: No    Attends Banker  Meetings: Never    Marital Status: Married  Catering manager Violence: Not At Risk (05/30/2023)   Humiliation, Afraid, Rape, and Kick questionnaire    Fear of Current or Ex-Partner: No    Emotionally Abused: No    Physically Abused: No    Sexually Abused: No    Family History: Family History  Problem Relation Age of Onset   Hypertension Mother    Diabetes Father    Heart disease Father    Stroke Father    Hypertension Sister    Sleep apnea Neg Hx     Current Medications:  Current Outpatient Medications:    albuterol (VENTOLIN HFA) 108 (90 Base) MCG/ACT inhaler, INHALE 2 PUFFS INTO THE LUNGS EVERY 6 HOURS AS NEEDED FOR WHEEZE OR SHORTNESS OF BREATH, Disp: 8.5 each, Rfl: 5   allopurinol (ZYLOPRIM) 300 MG tablet, TAKE 1 TABLET BY MOUTH EVERY DAY, Disp: 90 tablet, Rfl: 0   Blood Glucose Monitoring Suppl (ONETOUCH VERIO REFLECT) w/Device KIT, Use to test blood sugars daily as directed. DX: E11.9, Disp: 1 kit, Rfl: 1   clopidogrel (PLAVIX) 75 MG tablet, Take 1 tablet (75 mg total) by mouth daily., Disp: 30 tablet, Rfl: 11   Continuous Glucose Receiver (FREESTYLE LIBRE 3 READER) DEVI, 1 Device by Does not apply route every 14 (fourteen) days., Disp: 1 each, Rfl: 3   Continuous Glucose Sensor (FREESTYLE LIBRE 3 SENSOR) MISC, 1 Device by Does not apply route continuous., Disp: 1 each, Rfl: 2   docusate sodium (COLACE) 100 MG capsule, Take 1 capsule (100 mg total) by mouth daily as needed for mild constipation., Disp: , Rfl:    dutasteride (AVODART) 0.5 MG capsule, Take 1 capsule (0.5 mg total) by mouth daily., Disp: 90 capsule, Rfl: 3   famotidine (PEPCID) 20 MG tablet, Take 1 tablet (20 mg total) by mouth daily., Disp: 30 tablet, Rfl: 1   ferrous sulfate 325 (65 FE) MG EC tablet, Take 1 tablet (325 mg total) by mouth daily., Disp: , Rfl:    Fluticasone-Umeclidin-Vilant (TRELEGY ELLIPTA) 100-62.5-25 MCG/ACT AEPB, Inhale 1 puff into the lungs daily., Disp: 1 each, Rfl: 11   gabapentin  (NEURONTIN) 300 MG capsule, Take 1-2 capsules (300-600 mg total) by mouth 2 (  two) times daily. Take 300mg  (1 cap) in the morning and 600mg  (2 caps) in the evening for a total daily dose of 900mg ., Disp: , Rfl:    Insulin Degludec FlexTouch 200 UNIT/ML SOPN, Inject 16 units subcutaneus at bedtime as directed by the provider, Disp: 12 mL, Rfl: 3   Insulin Pen Needle (BD PEN NEEDLE NANO U/F) 32G X 4 MM MISC, Use to inject insulin once daily, Disp: 100 each, Rfl: 3   levothyroxine (SYNTHROID) 50 MCG tablet, Take 1 tablet (50 mcg total) by mouth daily., Disp: 90 tablet, Rfl: 1   linaclotide (LINZESS) 72 MCG capsule, Take 1 capsule (72 mcg total) by mouth daily before breakfast., Disp: 90 capsule, Rfl: 1   NON FORMULARY, CPAP at bedtime, Disp: , Rfl:    ondansetron (ZOFRAN) 4 MG tablet, Take 1 tablet (4 mg total) by mouth every 8 (eight) hours as needed for nausea or vomiting., Disp: 90 tablet, Rfl: 1   OneTouch Delica Lancets 30G MISC, Use to test blood sugars daily as directed. DX: E11.9, Disp: 100 each, Rfl: 1   OXYGEN, Inhale 2 L into the lungs daily as needed (with Cpap at night)., Disp: , Rfl:    pantoprazole (PROTONIX) 40 MG tablet, Take 1 tablet (40 mg total) by mouth daily., Disp: 90 tablet, Rfl: 0   pravastatin (PRAVACHOL) 40 MG tablet, TAKE 1 TABLET (40 MG TOTAL) BY MOUTH ON MONDAYS , WEDNESDAYS, AND FRIDAYS AS DIRECTED, Disp: 48 tablet, Rfl: 4   propranolol (INDERAL) 10 MG tablet, Take 1 tablet (10 mg total) by mouth 3 (three) times daily as needed., Disp: 90 tablet, Rfl: 6   torsemide (DEMADEX) 20 MG tablet, Take 0.5 tablets (10 mg total) by mouth daily., Disp: 90 tablet, Rfl: 2   zolpidem (AMBIEN) 5 MG tablet, Take 1 tablet (5 mg total) by mouth at bedtime as needed for sleep., Disp: 30 tablet, Rfl: 2   Allergies: Allergies  Allergen Reactions   Bisoprolol Other (See Comments)    Confusion, delirium    Sulfa Antibiotics Swelling    Mouth swelling   Atorvastatin Other (See Comments)     Muscle aches - tolerating Pravastatin 40 mg MWF   Jardiance [Empagliflozin] Other (See Comments)    FEELS SLUGGISH, TIRED   Lopressor [Metoprolol] Other (See Comments)    Fatigue   Rosuvastatin Other (See Comments)    Muscle aches - tolerating Pravastatin 40 mg MWF   Doxycycline Nausea Only   Temazepam Other (See Comments)    Made insomnia worse    Tramadol Itching    REVIEW OF SYSTEMS:   Review of Systems  Constitutional:  Negative for chills, fatigue and fever.  HENT:   Negative for lump/mass, mouth sores, nosebleeds, sore throat and trouble swallowing.   Eyes:  Negative for eye problems.  Respiratory:  Positive for cough and shortness of breath.   Cardiovascular:  Negative for chest pain, leg swelling and palpitations.  Gastrointestinal:  Positive for constipation and nausea. Negative for abdominal pain, diarrhea and vomiting.  Genitourinary:  Negative for bladder incontinence, difficulty urinating, dysuria, frequency, hematuria and nocturia.   Musculoskeletal:  Negative for arthralgias, back pain, flank pain, myalgias and neck pain.  Skin:  Negative for itching and rash.  Neurological:  Positive for headaches. Negative for dizziness and numbness.  Hematological:  Does not bruise/bleed easily.  Psychiatric/Behavioral:  Negative for depression, sleep disturbance and suicidal ideas. The patient is not nervous/anxious.   All other systems reviewed and are negative.  VITALS:   Blood pressure 121/63, pulse (!) 101, temperature 98.1 F (36.7 C), temperature source Oral, resp. rate 20, weight 146 lb 9.7 oz (66.5 kg), SpO2 95%.  Wt Readings from Last 3 Encounters:  07/06/23 146 lb 9.7 oz (66.5 kg)  06/05/23 150 lb 6.4 oz (68.2 kg)  05/31/23 147 lb 11.3 oz (67 kg)    Body mass index is 23.66 kg/m.  Performance status (ECOG): 1 - Symptomatic but completely ambulatory  PHYSICAL EXAM:   Physical Exam Vitals and nursing note reviewed. Exam conducted with a chaperone present.   Constitutional:      Appearance: Normal appearance.  Cardiovascular:     Rate and Rhythm: Normal rate and regular rhythm.     Pulses: Normal pulses.     Heart sounds: Normal heart sounds.  Pulmonary:     Effort: Pulmonary effort is normal.     Breath sounds: Normal breath sounds.  Abdominal:     Palpations: Abdomen is soft. There is no hepatomegaly, splenomegaly or mass.     Tenderness: There is no abdominal tenderness.  Musculoskeletal:     Right lower leg: No edema.     Left lower leg: No edema.  Lymphadenopathy:     Cervical: No cervical adenopathy.     Right cervical: No superficial, deep or posterior cervical adenopathy.    Left cervical: No superficial, deep or posterior cervical adenopathy.     Upper Body:     Right upper body: No supraclavicular or axillary adenopathy.     Left upper body: No supraclavicular or axillary adenopathy.  Neurological:     General: No focal deficit present.     Mental Status: He is alert and oriented to person, place, and time.  Psychiatric:        Mood and Affect: Mood normal.        Behavior: Behavior normal.     LABS:      Latest Ref Rng & Units 06/29/2023    3:21 PM 06/05/2023   12:30 PM 05/31/2023    6:07 AM  CBC  WBC 4.0 - 10.5 K/uL 11.0  8.4  10.0   Hemoglobin 13.0 - 17.0 g/dL 16.1  09.6  04.5   Hematocrit 39.0 - 52.0 % 43.2  39.0  36.7   Platelets 150 - 400 K/uL 331  377  302       Latest Ref Rng & Units 06/29/2023    3:21 PM 06/05/2023   12:30 PM 05/31/2023    6:07 AM  CMP  Glucose 70 - 99 mg/dL 409  811  914   BUN 8 - 23 mg/dL 22  11  12    Creatinine 0.61 - 1.24 mg/dL 7.82  9.56  2.13   Sodium 135 - 145 mmol/L 136  140  136   Potassium 3.5 - 5.1 mmol/L 5.0  4.5  3.9   Chloride 98 - 111 mmol/L 98  96  102   CO2 22 - 32 mmol/L 28  27  25    Calcium 8.9 - 10.3 mg/dL 9.5  9.7  9.2   Total Protein 6.5 - 8.1 g/dL 7.7  6.7    Total Bilirubin 0.0 - 1.2 mg/dL 0.6  0.3    Alkaline Phos 38 - 126 U/L 118  166    AST 15 - 41 U/L  22  16    ALT 0 - 44 U/L 19  18       No results found for: "CEA1", "CEA" / No results  found for: "CEA1", "CEA" Lab Results  Component Value Date   PSA1 <0.1 10/31/2022   No results found for: "FIE332" No results found for: "RJJ884"  Lab Results  Component Value Date   TOTALPROTELP 7.0 04/18/2021   ALBUMINELP 3.7 04/18/2021   A1GS 0.3 04/18/2021   A2GS 0.8 04/18/2021   BETS 1.2 04/18/2021   GAMS 1.0 04/18/2021   MSPIKE Not Observed 04/18/2021   SPEI Comment 04/18/2021   Lab Results  Component Value Date   TIBC 332 06/29/2023   TIBC 425 03/24/2023   TIBC 541 (H) 02/17/2023   FERRITIN 185 06/29/2023   FERRITIN 29 03/24/2023   FERRITIN 10 (L) 02/17/2023   IRONPCTSAT 25 06/29/2023   IRONPCTSAT 49 (H) 03/24/2023   IRONPCTSAT 4 (L) 02/17/2023   Lab Results  Component Value Date   LDH 133 08/02/2018   LDH 126 03/08/2018   LDH 141 10/28/2017     STUDIES:   CT CHEST W CONTRAST Result Date: 07/05/2023 CLINICAL DATA:  Non-small cell lung cancer, monitor. * Tracking Code: BO * EXAM: CT CHEST WITH CONTRAST TECHNIQUE: Multidetector CT imaging of the chest was performed during intravenous contrast administration. RADIATION DOSE REDUCTION: This exam was performed according to the departmental dose-optimization program which includes automated exposure control, adjustment of the mA and/or kV according to patient size and/or use of iterative reconstruction technique. CONTRAST:  80mL OMNIPAQUE IOHEXOL 300 MG/ML  SOLN COMPARISON:  Chest CT 03/24/2023 and 12/16/2022.  PET-CT 05/15/2022. FINDINGS: Cardiovascular: No acute vascular findings are identified. There is suboptimal opacification of the pulmonary arteries, without evidence of acute pulmonary embolism. Extensive atherosclerosis of the aorta, great vessels and coronary arteries again noted. Left subclavian Port-A-Cath extends to the superior cavoatrial junction. There are calcifications of the aortic valve and mitral annulus. The heart  size is normal. There is no pericardial effusion. Mediastinum/Nodes: There are no enlarged mediastinal, hilar or axillary lymph nodes. The thyroid gland, trachea and esophagus demonstrate no significant findings. Lungs/Pleura: Interval resolution of previously demonstrated right-sided pleural effusion. No pneumothorax. Stable treatment changes with architectural distortion in the right perihilar region. Nodule in the superior segment of the right lower lobe appears slightly larger (best seen on the soft tissue windows). This measures 2.4 x 1.4 cm on image 66/2 and demonstrates central necrosis (previously 1.8 x 1.4 cm). Chronic treatment changes are present within the left upper lobe with interval increased soft tissue density measuring up to 4.2 x 3.5 cm on image 29/3. This is concerning for local recurrence in this region. Unchanged cephalad 0.7 cm left upper lobe nodule on image 19/3 and 7 mm left lower lobe nodule on image 75/3 are unchanged. No other enlarging or new nodules are identified. Upper abdomen: Stable appearance of the visualized upper abdomen, without significant findings. Musculoskeletal/Chest wall: There is no chest wall mass or suspicious osseous finding. Stable mild bilateral gynecomastia. Loop recorder within the left anterior chest wall. IMPRESSION: 1. Interval increased soft tissue density within the treatment bed in the left upper lobe, concerning for local recurrence. Slight interval increase in size of necrotic nodule in the superior segment of the right lower lobe, also suspicious for tumor progression. Consider PET-CT for further evaluation. 2. Stable additional small left lung nodules. Interval resolution of right-sided pleural effusion. 3. No evidence of metastatic adenopathy or acute thoracic findings. 4. Aortic Atherosclerosis (ICD10-I70.0) and Emphysema (ICD10-J43.9). Electronically Signed   By: Carey Bullocks M.D.   On: 07/05/2023 10:17   CUP PACEART REMOTE DEVICE CHECK Result  Date: 06/29/2023 ILR summary report received. Battery status OK. Normal device function. No new symptom, brady, or pause episodes. No new AF episodes.  6 tachy episodes consistent with atrial driven tachycardia and ST.  2 AT episodes, longest 10 hours, known history. Monthly summary reports and ROV/PRN - CS, CV[facility]

## 2023-07-09 ENCOUNTER — Encounter (HOSPITAL_COMMUNITY)
Admission: RE | Admit: 2023-07-09 | Discharge: 2023-07-09 | Disposition: A | Discharge status: Home / Self Care | Source: Ambulatory Visit | Attending: Hematology | Admitting: Hematology

## 2023-07-09 DIAGNOSIS — L84 Corns and callosities: Secondary | ICD-10-CM | POA: Diagnosis not present

## 2023-07-09 DIAGNOSIS — J90 Pleural effusion, not elsewhere classified: Secondary | ICD-10-CM | POA: Diagnosis not present

## 2023-07-09 DIAGNOSIS — C3490 Malignant neoplasm of unspecified part of unspecified bronchus or lung: Secondary | ICD-10-CM | POA: Diagnosis not present

## 2023-07-09 DIAGNOSIS — C3491 Malignant neoplasm of unspecified part of right bronchus or lung: Secondary | ICD-10-CM | POA: Diagnosis not present

## 2023-07-09 DIAGNOSIS — M79674 Pain in right toe(s): Secondary | ICD-10-CM | POA: Diagnosis not present

## 2023-07-09 DIAGNOSIS — K8020 Calculus of gallbladder without cholecystitis without obstruction: Secondary | ICD-10-CM | POA: Diagnosis not present

## 2023-07-09 DIAGNOSIS — K7460 Unspecified cirrhosis of liver: Secondary | ICD-10-CM | POA: Insufficient documentation

## 2023-07-09 DIAGNOSIS — C3412 Malignant neoplasm of upper lobe, left bronchus or lung: Secondary | ICD-10-CM | POA: Diagnosis not present

## 2023-07-09 DIAGNOSIS — I2510 Atherosclerotic heart disease of native coronary artery without angina pectoris: Secondary | ICD-10-CM | POA: Insufficient documentation

## 2023-07-09 DIAGNOSIS — J439 Emphysema, unspecified: Secondary | ICD-10-CM | POA: Insufficient documentation

## 2023-07-09 DIAGNOSIS — C3411 Malignant neoplasm of upper lobe, right bronchus or lung: Secondary | ICD-10-CM | POA: Insufficient documentation

## 2023-07-09 DIAGNOSIS — C3431 Malignant neoplasm of lower lobe, right bronchus or lung: Secondary | ICD-10-CM | POA: Diagnosis not present

## 2023-07-09 DIAGNOSIS — I700 Atherosclerosis of aorta: Secondary | ICD-10-CM | POA: Diagnosis not present

## 2023-07-09 DIAGNOSIS — R911 Solitary pulmonary nodule: Secondary | ICD-10-CM | POA: Insufficient documentation

## 2023-07-09 DIAGNOSIS — B351 Tinea unguium: Secondary | ICD-10-CM | POA: Diagnosis not present

## 2023-07-09 DIAGNOSIS — E1142 Type 2 diabetes mellitus with diabetic polyneuropathy: Secondary | ICD-10-CM | POA: Diagnosis not present

## 2023-07-09 DIAGNOSIS — M79675 Pain in left toe(s): Secondary | ICD-10-CM | POA: Diagnosis not present

## 2023-07-09 MED ORDER — FLUDEOXYGLUCOSE F - 18 (FDG) INJECTION
6.9900 | Freq: Once | INTRAVENOUS | Status: AC | PRN
  Administered 2023-07-09: 6.99 via INTRAVENOUS

## 2023-07-14 NOTE — Progress Notes (Signed)
 [facility] [address], [place] [address]    Clinic Day:  07/15/2023  Referring physician: Junie Spencer, FNP  Patient Care Team: Junie Spencer, FNP as PCP - General (Family Medicine) O'Neal, Ronnald Ramp, MD as PCP - Cardiology (Cardiology) Lanier Prude, MD as PCP - Electrophysiology (Cardiology) Doreatha Massed, MD as Consulting Physician (Hematology) Danella Maiers, [facility] (Pharmacist) O'Neal, Ronnald Ramp, MD as Consulting Physician (Cardiology) McKenzie, Mardene Celeste, MD as Consulting Physician (Urology) Delora Fuel, OD (Optometry) Dani Gobble, NP as Nurse Practitioner (Endocrinology) Adam Phenix, DPM as Consulting Physician (Podiatry) Cephus Shelling, MD as Consulting Physician (Vascular Surgery)   ASSESSMENT & PLAN:   Assessment: 1.  Stage IIIa (T1b N2 cM0) NSCLC, favoring adenocarcinoma: -Chemo XRT with weekly carboplatin and paclitaxel completed on 08/20/2016. -Consolidation durvalumab from 09/26/2016 through 09/25/2017. -Reviewed CT chest on 11/07/2019 which showed increasing size of the upper lobe pulmonary nodules with no new nodule in the left upper lobe suspicious for meta stasis.  Also increasing confluent soft tissue in the right suprahilar region, thought to be postradiation changes. -PET scan on 11/21/2019 showed suspected radiation changes in the right paramediastinal/perihilar region although mild hypermetabolism is present in the right retroareolar region.SUV 2.5.  While progressive soft tissue is present in this region, this hypermetabolism remains nonfocal and favors radiation changes.  7 mm central left upper lobe nodule max SUV 1.4.  9 mm aggregate nodule in the lateral right upper lobe max SUV 2.4.  These are minimally progressive from more remote prior scans.  6 mm left lower lobe nodule unchanged.  No hypermetabolic thoracic adenopathy. -CT chest on 03/23/2020 showed many of the lung nodules are stable.  Couple  of nodules have grown by few millimeters.  No new areas seen. - SBRT to the right and left lung lesions, 54 Gray in 3 fractions from 09/10/2020 through 09/17/2020. - SBRT to the RUL and LUL lesions from 06/19/2022 through 06/26/2022 by Dr. Roselind Messier.   2.  Normocytic to macrocytic anemia: - Found to have hemoglobin 6.3 on 10/09/2020 and received 2 units PRBC while hospitalized.  I have reviewed hospitalization records. - EGD on 09/06/2020 with tiny gastric ulcer without bleeding or stigmata. - Blood loss this year most likely postoperative in nature (urological surgery in February and vascular surgery in May).    Plan: 1.  Stage IIIa (T1b N2 cM0) NSCLC, favoring adenocarcinoma: - CT chest (03/24/2023): Right lower lobe nodule 1.8 x 1.8 cm, previously 1.1 x 1.1 cm in the right infrahilar region.  Left upper lobe radiation changes present. - CT chest (06/28/2020): Right lower lobe nodule measures 2.4 x 1.4 cm with central necrosis, previously 1.8 x 1.4 cm.  Chronic treatment changes present in the left upper lobe with interval increase soft tissue density measuring 4.2 x 3.5 cm.  Unchanged 0.7 cm left upper lobe nodule.  No evidence of adenopathy. - We reviewed PET scan from 07/09/2023: Left upper lobe mass measures 3.5 x 5.1 cm with SUV 10.8.  Additional hypermetabolic nodules in the right lung both within the postradiation consolidation in the right upper lobe SUV 10.2 and within the medial right lower lobe SUV 8.4 measuring 2 x 2.4 cm.  No hypermetabolic adenopathy. - Recommend bronchoscopy and biopsy by Drs. Smith/Byrum. - Will send NGS testing on the biopsy specimen.  Will also send Guardant360 today. - Also recommend MRI of the brain with and without contrast.    2.  Normocytic to macrocytic anemia: -  Monoferric on 04/10/2023.  CBC on 06/29/2023 with hemoglobin 13.7 and ferritin 185.  No indication for parenteral iron therapy.  3.  Decreased appetite/weight loss: - From recent hospitalization in February,  he has lost about 8 pounds.  He reports continued decrease in appetite.  Will start him on Megace 400 mg twice daily.  He was also advised to drink 1 can of boost daily.     Orders Placed This Encounter  Procedures   MR Brain W Wo Contrast    Standing Status:   Future    Expiration Date:   07/14/2024    If indicated for the ordered procedure, I authorize the administration of contrast media per Radiology protocol:   Yes    What is the patient's sedation requirement?:   No Sedation    Does the patient have a pacemaker or implanted devices?:   No    Use SRS Protocol?:   No    Preferred imaging location?:   [facility] (table limit - 550lbs)      I,Katie Daubenspeck,acting as a scribe for Doreatha Massed, MD.,have documented all relevant documentation on the behalf of Doreatha Massed, MD,as directed by  Doreatha Massed, MD while in the presence of Doreatha Massed, MD.   I, Doreatha Massed MD, have reviewed the above documentation for accuracy and completeness, and I agree with the above.   Doreatha Massed, MD   4/9/202510:52 AM  CHIEF COMPLAINT:   Diagnosis: right NSCLC, favor adenocarcinoma   Cancer Staging  Non-small cell lung cancer, right [facility]) Staging form: Lung, AJCC 8th Edition - Clinical stage from 06/05/2016: Stage IIIA (cT1b(2), cN2, cM0) - Signed by Ellouise Newer, PA-C on 08/21/2016    Prior Therapy: 1. Chemoradiation with weekly carboplatin and paclitaxel, 06/25/16 - 08/20/16. 2. Consolidation durvalumab, 09/26/16 - 09/25/17. 3. SBRT to RUL, LUL nodules 09/10/20 - 09/17/20 4. SBRT to RUL, LUL nodules 06/19/22 - 06/26/22  Current Therapy:  surveillance    HISTORY OF PRESENT ILLNESS:   Oncology History  Non-small cell lung cancer, right (HCC)  04/17/2016 Imaging   CT chest- 1. Examination is positive for bilateral upper lobe pulmonary lesions suspicious for malignancy. Further investigation with PET-CT and tissue sampling is advised. 2.  Borderline enlarged paratracheal lymph nodes and right hilar nodes. Cannot rule out metastatic adenopathy. Attention to these areas on PET-CT is advised. 3. Emphysema 4. Aortic atherosclerosis and multi vessel coronary artery calcification.   04/18/2016 Initial Diagnosis   Adenocarcinoma of lung, right (HCC)   04/24/2016 PET scan   1. Adjacent hypermetabolic nodules in the RIGHT upper lobe consistent bronchogenic carcinoma. 2. Suspicion of ipsilateral hilar and paratracheal nodal metastasis. 3. Nodule in the LEFT upper lobe with suspicious morphology has a relatively low metabolic activity for size. Favor synchronous primary bronchogenic carcinoma. 4. No metabolic activity associated with a small LEFT lower lobe pulmonary nodule. Recommend attention on follow-up.   05/06/2016 Imaging   MRI brain- Negative for metastatic disease.   05/28/2016 Procedure   1. Electromagnetic navigational bronchoscopy with brushings, needle     aspirations and biopsies of bilateral upper lobe nodules. 2. Endobronchial ultrasound with mediastinal and hilar lymph node     aspirations. By Dr. Dorris Fetch   06/03/2016 Pathology Results   Diagnosis 1. Lung, biopsy, Right upper lobe - BLOOD AND BENIGN BRONCHIAL AND LUNG TISSUE. 2. Lung, biopsy, Left upper lobe - BLOOD AND BENIGN BRONCHIAL AND LUNG TISSUE.   06/03/2016 Pathology Results   Diagnosis TRANSBRONCHIAL NEEDLE ASPIRATION, NAVIGATION, RUL (SPECIMEN  5 OF 7, COLLECTED 05/28/16): MALIGNANT CELLS CONSISTENT WITH NON-SMALL CELL CARCINOMA Comment There are limited tumor cells in the cell block (insufficient for molecular testing). TTF-1 is positive in a few of the atypical cells. They appear negative for synaptophysin and cytokeratin 5/6. Overall there is limited tumor, but a lung adenocarcinoma is slightly favored. Dr. Luisa Hart has reviewed the case. The case was called to Dr. Dorris Fetch on 06/03/2016.   06/13/2016 Procedure   Port placed by Dr.  Lovell Sheehan   06/25/2016 - 08/15/2016 Chemotherapy   The patient had palonosetron (ALOXI) injection 0.25 mg, 0.25 mg, Intravenous,  Once, 6 of 6 cycles  CARBOplatin (PARAPLATIN) 220 mg in sodium chloride 0.9 % 250 mL chemo infusion, 220 mg (100 % of original dose 215.8 mg), Intravenous,  Once, 6 of 6 cycles Dose modification:   (original dose 215.8 mg, Cycle 1),   (original dose 215.8 mg, Cycle 5),   (original dose 215.8 mg, Cycle 6),   (original dose 215.8 mg, Cycle 2),   (original dose 215.8 mg, Cycle 3),   (original dose 215.8 mg, Cycle 4)  PACLitaxel (TAXOL) 90 mg in dextrose 5 % 250 mL chemo infusion ( for chemotherapy treatment.     06/25/2016 - 08/20/2016 Radiation Therapy   Radiation in [place], [place] by Dr. Louis Matte.  R lung to 6600 cGy   07/09/2016 - 07/10/2016 Hospital Admission   Admit date: 07/09/2016  Admission diagnosis: Fever and chills Additional comments: Negative work-up, discharged with course of Levaquin   07/09/2016 Treatment Plan Change   Treatment deferred x 1 week due to hospitalization    08/05/2016 - 08/06/2016 Hospital Admission   Admit date: 08/05/2016 Admission diagnosis: Joint pain Additional comments: Treated with steroids with symptomatic improvement   08/25/2016 Imaging   MRI brain- Negative for intracranial metastasis on this motion degraded examination.  Stable mild-to-moderate chronic small vessel ischemic disease.   09/18/2016 PET scan   1. Partial metabolic response. Right upper lobe 2.6 cm hypermetabolic pulmonary nodule, decreased in size and metabolism. Mildly hypermetabolic right hilar nodal metastasis, decreased in metabolism. Right paratracheal lymph node is no longer hypermetabolic. No new or progressive metastatic disease. 2. Apical left upper lobe 0.9 cm solid pulmonary nodule, slightly decreased in size, with no significant metabolism. The change in size could indicate a neoplastic etiology for this nodule. 3. Additional subcentimeter pulmonary nodules  in the left lung are stable and below PET resolution . 4. Additional findings include aortic atherosclerosis, coronary atherosclerosis, moderate emphysema, stable small pericardial effusion/thickening and mild sigmoid diverticulosis.   01/13/2017 Imaging   CT chest with contrast IMPRESSION: 1. No change in size of the hypermetabolic nodular thickening in the RIGHT upper lobe. 2. LEFT upper lobe spiculated nodule is stable. 3. Rounded LEFT lower lobe pulmonary nodule is stable. 4. No evidence of lung cancer progression.   05/08/2017 Adverse Reaction   Development of Hypothyroidism- likely secondary to immunotherapy.  Levothyroxine therapy initiated.       INTERVAL HISTORY:   John [name] is a 78 y.o. male presenting to clinic today for follow up of right NSCLC. He was last seen by me on 07/06/23.  Since his last visit, he underwent restaging PET scan on 07/09/23.  Today, he states that he is doing well overall. His appetite level is at 50%. His energy level is at 60%.  PAST MEDICAL HISTORY:   Past 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   Anemia    Anxiety  Arthritis    Asthma    Atrial flutter (HCC)    BPH (benign prostatic hyperplasia)    with urinary retention 02/06/20   CHF (congestive heart failure) (HCC)    COPD (chronic obstructive pulmonary disease) (HCC)    Depression    Diabetes mellitus, type II (HCC)    Dyspnea    History of kidney stones    History of radiation therapy    right lung 09/10/2020-09/17/2020   Dr Roselind Messier   History of radiation therapy    Left and right Lung- 06/19/22-06/26/22   Hyperlipidemia    Hypertension    Hypothyroidism    Macular degeneration    Neuropathy    Non-small cell lung cancer, right (HCC) 04/18/2016   Peripheral vascular disease (HCC)    Prostatitis    Pulmonary nodule, left 07/16/2016   Sleep apnea    cpap    Surgical History: Past Surgical History:  Procedure  Laterality Date   A-FLUTTER ABLATION N/A 01/13/2022   Procedure: A-FLUTTER ABLATION;  Surgeon: Lanier Prude, MD;  [facility]: [facility];  Service: Cardiovascular;  Laterality: N/A;   ABDOMINAL AORTOGRAM W/LOWER EXTREMITY Left 02/06/2020   Procedure: ABDOMINAL AORTOGRAM W/LOWER EXTREMITY;  Surgeon: Runell Gess, MD;  Location: [facility];  Service: Cardiovascular;  Laterality: Left;   ABDOMINAL AORTOGRAM W/LOWER EXTREMITY N/A 05/30/2021   Procedure: ABDOMINAL AORTOGRAM W/LOWER EXTREMITY;  Surgeon: Cephus Shelling, MD;  Location: [facility];  Service: Cardiovascular;  Laterality: N/A;   ABDOMINAL AORTOGRAM W/LOWER EXTREMITY N/A 02/05/2023   Procedure: ABDOMINAL AORTOGRAM W/LOWER EXTREMITY;  Surgeon: Cephus Shelling, MD;  Location: [facility];  Service: Cardiovascular;  Laterality: N/A;   BIOPSY  09/16/2021   Procedure: BIOPSY;  Surgeon: Iva Boop, MD;  Location: [facility] ENDOSCOPY;  Service: Gastroenterology;;   CARDIOVERSION N/A 10/05/2020   Procedure: CARDIOVERSION;  Surgeon: Sande Rives, MD;  Location: [facility] ENDOSCOPY;  Service: Cardiovascular;  Laterality: N/A;   CATARACT EXTRACTION, BILATERAL Bilateral    COLONOSCOPY WITH PROPOFOL N/A 06/20/2021   Procedure: COLONOSCOPY WITH PROPOFOL;  Surgeon: Hilarie Fredrickson, MD;  Location: [facility] ENDOSCOPY;  Service: Endoscopy;  Laterality: N/A;   COLONOSCOPY WITH PROPOFOL N/A 09/16/2021   Procedure: COLONOSCOPY WITH PROPOFOL;  Surgeon: Iva Boop, MD;  Location: [facility] ENDOSCOPY;  Service: Gastroenterology;  Laterality: N/A;   ENDARTERECTOMY FEMORAL Right 08/20/2020   Procedure: ENDARTERECTOMY  RIGHT FEMORAL ARTERY;  Surgeon: Cephus Shelling, MD;  Location: [facility] OR;  Service: Vascular;  Laterality: Right;   ENTEROSCOPY N/A 10/11/2021   Procedure: ENTEROSCOPY;  Surgeon: Dolores Frame, MD;  Location: [facility] ENDO SUITE;  Service: Gastroenterology;  Laterality: N/A;   ENTEROSCOPY N/A 02/18/2023    Procedure: ENTEROSCOPY;  Surgeon: Corbin Ade, MD;  Location: [facility] ENDO SUITE;  Service: Endoscopy;  Laterality: N/A;  EGD with possible small bowel push enteroscopy   ESOPHAGOGASTRODUODENOSCOPY N/A 09/16/2021   Procedure: ESOPHAGOGASTRODUODENOSCOPY (EGD);  Surgeon: Iva Boop, MD;  Location: [facility] ENDOSCOPY;  Service: Gastroenterology;  Laterality: N/A;   ESOPHAGOGASTRODUODENOSCOPY (EGD) WITH PROPOFOL N/A 09/06/2020   Procedure: ESOPHAGOGASTRODUODENOSCOPY (EGD) WITH PROPOFOL;  Surgeon: Hilarie Fredrickson, MD;  Location: [facility] ENDOSCOPY;  Service: Endoscopy;  Laterality: N/A;   ESOPHAGOGASTRODUODENOSCOPY (EGD) WITH PROPOFOL N/A 10/11/2021   Procedure: ESOPHAGOGASTRODUODENOSCOPY (EGD) WITH PROPOFOL;  Surgeon: Dolores Frame, MD;  [facility];  Service: Gastroenterology;  Laterality: N/A;   ESOPHAGOGASTRODUODENOSCOPY (EGD) WITH PROPOFOL N/A 02/18/2023   Procedure: ESOPHAGOGASTRODUODENOSCOPY (EGD) WITH PROPOFOL;  Surgeon:  Rourk, Gerrit Friends, MD;  Location: [facility];  Service: Endoscopy;  Laterality: N/A;   GIVENS CAPSULE STUDY N/A 02/19/2023   Procedure: GIVENS CAPSULE STUDY;  Surgeon: Franky Macho, MD;  Location: [facility];  Service: Endoscopy;  Laterality: N/A;   HEMOSTASIS CLIP PLACEMENT  09/16/2021   Procedure: HEMOSTASIS CLIP PLACEMENT;  Surgeon: Iva Boop, MD;  Location: [facility] ENDOSCOPY;  Service: Gastroenterology;;   HOT HEMOSTASIS N/A 09/16/2021   Procedure: HOT HEMOSTASIS (ARGON PLASMA COAGULATION/BICAP);  Surgeon: Iva Boop, MD;  Location: [facility] ENDOSCOPY;  Service: Gastroenterology;  Laterality: N/A;   HOT HEMOSTASIS  10/11/2021   Procedure: HOT HEMOSTASIS (ARGON PLASMA COAGULATION/BICAP);  Surgeon: Marguerita Merles, Reuel Boom, MD;  [facility]: [facility];  Service: Gastroenterology;;   HOT HEMOSTASIS  02/18/2023   Procedure: HOT HEMOSTASIS (ARGON PLASMA COAGULATION/BICAP);  Surgeon: Corbin Ade, MD;  [facility]: [facility];  Service: Endoscopy;;   INSERTION  OF ILIAC STENT Right 08/20/2020   Procedure: RETROGRADE INSERTION OF RIGHT ILIAC STENT;  Surgeon: Cephus Shelling, MD;  Location: [facility] OR;  Service: Vascular;  Laterality: Right;   INTRAOPERATIVE ARTERIOGRAM Right 08/20/2020   Procedure: INTRA OPERATIVE ARTERIOGRAM ILIAC;  Surgeon: Cephus Shelling, MD;  Location: [facility] OR;  Service: Vascular;  Laterality: Right;   PATCH ANGIOPLASTY Right 08/20/2020   Procedure: PATCH ANGIOPLASTY RIGHT FEMORAL ARTERY;  Surgeon: Cephus Shelling, MD;  Location: [facility] OR;  Service: Vascular;  Laterality: Right;   PERIPHERAL VASCULAR BALLOON ANGIOPLASTY Right 05/30/2021   Procedure: PERIPHERAL VASCULAR BALLOON ANGIOPLASTY;  Surgeon: Cephus Shelling, MD;  [facility];  Service: Cardiovascular;  Laterality: Right;   PERIPHERAL VASCULAR INTERVENTION Right 02/05/2023   Procedure: PERIPHERAL VASCULAR INTERVENTION;  Surgeon: Cephus Shelling, MD;  [facility]: [facility];  Service: Cardiovascular;  Laterality: Right;  Rt SFA and Rt Ext Iliac   PORTACATH PLACEMENT Left 06/13/2016   Procedure: INSERTION PORT-A-CATH;  Surgeon: Franky Macho, MD;  Location: [facility] ORS;  Service: General;  Laterality: Left;   TRANSURETHRAL RESECTION OF PROSTATE N/A 05/31/2020   Procedure: TRANSURETHRAL RESECTION OF THE PROSTATE (TURP);  Surgeon: Malen Gauze, MD;  Location: [facility] ORS;  Service: Urology;  Laterality: N/A;   VIDEO BRONCHOSCOPY WITH ENDOBRONCHIAL NAVIGATION N/A 05/28/2016   Procedure: VIDEO BRONCHOSCOPY WITH ENDOBRONCHIAL NAVIGATION;  Surgeon: Loreli Slot, MD;  Location: [facility] OR;  Service: Thoracic;  Laterality: N/A;   VIDEO BRONCHOSCOPY WITH ENDOBRONCHIAL ULTRASOUND N/A 05/28/2016   Procedure: VIDEO BRONCHOSCOPY WITH ENDOBRONCHIAL ULTRASOUND;  Surgeon: Loreli Slot, MD;  Location: [facility] OR;  Service: Thoracic;  Laterality: N/A;    Social History: Social History   Socioeconomic History   Marital status: Significant Other    Spouse name:  Not on file   Number of children: 5   Years of education: 10   Highest education level: 10th grade  Occupational History   Occupation: Retired  Tobacco Use   Smoking status: Every Day    Current packs/day: 0.50    Average packs/day: 0.5 packs/day for 62.3 years (31.2 ttl pk-yrs)    Types: Cigarettes    Start date: 03/11/1961   Smokeless tobacco: Never   Tobacco comments:    1/2 pack to 1 pack per day 12/14/19  Vaping Use   Vaping status: Never Used  Substance and Sexual Activity   Alcohol use: No   Drug use: No   Sexual activity: Yes    Birth control/protection: None  Other Topics Concern   Not on  file  Social History Narrative   [name] is his POA and lives with him. Children out of town - one in [facility], others in [place].   Social Drivers of Corporate investment banker Strain: Low Risk  (05/30/2023)   Overall Financial Resource Strain (CARDIA)    Difficulty of Paying Living Expenses: Not very hard  Food Insecurity: No Food Insecurity (05/30/2023)   Hunger Vital Sign    Worried About Running Out of Food in the Last Year: Never true    Ran Out of Food in the Last Year: Never true  Transportation Needs: No Transportation Needs (05/30/2023)   PRAPARE - Administrator, Civil Service (Medical): No    Lack of Transportation (Non-Medical): No  Physical Activity: Insufficiently Active (05/30/2023)   Exercise Vital Sign    Days of Exercise per Week: 3 days    Minutes of Exercise per Session: 10 min  Stress: No Stress Concern Present (05/30/2023)   [employer] of Occupational Health - Occupational Stress Questionnaire    Feeling of Stress : Not at all  Social Connections: Moderately Isolated (05/30/2023)   Social Connection and Isolation Panel [NHANES]    Frequency of Communication with Friends and Family: Twice a week    Frequency of Social Gatherings with Friends and Family: Twice a week    Attends Religious Services: Never    Database administrator or  Organizations: No    Attends Banker Meetings: Never    Marital Status: Married  Catering manager Violence: Not At Risk (05/30/2023)   Humiliation, Afraid, Rape, and Kick questionnaire    Fear of Current or Ex-Partner: No    Emotionally Abused: No    Physically Abused: No    Sexually Abused: No    Family History: Family History  Problem Relation Age of Onset   Hypertension Mother    Diabetes Father    Heart disease Father    Stroke Father    Hypertension Sister    Sleep apnea Neg Hx     Current Medications:  Current Outpatient Medications:    albuterol (VENTOLIN HFA) 108 (90 Base) MCG/ACT inhaler, INHALE 2 PUFFS INTO THE LUNGS EVERY 6 HOURS AS NEEDED FOR WHEEZE OR SHORTNESS OF BREATH, Disp: 8.5 each, Rfl: 5   allopurinol (ZYLOPRIM) 300 MG tablet, TAKE 1 TABLET BY MOUTH EVERY DAY, Disp: 90 tablet, Rfl: 0   bisoprolol (ZEBETA) 5 MG tablet, Take 5 mg by mouth daily., Disp: , Rfl:    Blood Glucose Monitoring Suppl (ONETOUCH VERIO REFLECT) w/Device KIT, Use to test blood sugars daily as directed. DX: E11.9, Disp: 1 kit, Rfl: 1   clopidogrel (PLAVIX) 75 MG tablet, Take 1 tablet (75 mg total) by mouth daily., Disp: 30 tablet, Rfl: 11   Continuous Glucose Receiver (FREESTYLE LIBRE 3 READER) DEVI, 1 Device by Does not apply route every 14 (fourteen) days., Disp: 1 each, Rfl: 3   Continuous Glucose Sensor (FREESTYLE LIBRE 3 SENSOR) MISC, 1 Device by Does not apply route continuous., Disp: 1 each, Rfl: 2   diltiazem (CARDIZEM CD) 120 MG 24 hr capsule, Take by mouth daily as needed., Disp: , Rfl:    docusate sodium (COLACE) 100 MG capsule, Take 1 capsule (100 mg total) by mouth daily as needed for mild constipation., Disp: , Rfl:    dutasteride (AVODART) 0.5 MG capsule, Take 1 capsule (0.5 mg total) by mouth daily., Disp: 90 capsule, Rfl: 3   famotidine (PEPCID) 20 MG tablet, Take  1 tablet (20 mg total) by mouth daily., Disp: 30 tablet, Rfl: 1   ferrous sulfate 325 (65 FE) MG EC  tablet, Take 1 tablet (325 mg total) by mouth daily., Disp: , Rfl:    Fluticasone-Umeclidin-Vilant (TRELEGY ELLIPTA) 100-62.5-25 MCG/ACT AEPB, Inhale 1 puff into the lungs daily., Disp: 1 each, Rfl: 11   gabapentin (NEURONTIN) 300 MG capsule, Take 1-2 capsules (300-600 mg total) by mouth 2 (two) times daily. Take 300mg  (1 cap) in the morning and 600mg  (2 caps) in the evening for a total daily dose of 900mg ., Disp: , Rfl:    Insulin Degludec FlexTouch 200 UNIT/ML SOPN, Inject 16 units subcutaneus at bedtime as directed by the provider, Disp: 12 mL, Rfl: 3   Insulin Pen Needle (BD PEN NEEDLE NANO U/F) 32G X 4 MM MISC, Use to inject insulin once daily, Disp: 100 each, Rfl: 3   levothyroxine (SYNTHROID) 50 MCG tablet, Take 1 tablet (50 mcg total) by mouth daily., Disp: 90 tablet, Rfl: 1   linaclotide (LINZESS) 72 MCG capsule, Take 1 capsule (72 mcg total) by mouth daily before breakfast., Disp: 90 capsule, Rfl: 1   megestrol (MEGACE) 400 MG/10ML suspension, Take 10 mLs (400 mg total) by mouth 2 (two) times daily., Disp: 480 mL, Rfl: 3   metolazone (ZAROXOLYN) 2.5 MG tablet, Take 2.5 mg by mouth 3 (three) times a week., Disp: , Rfl:    NON FORMULARY, CPAP at bedtime, Disp: , Rfl:    ondansetron (ZOFRAN) 4 MG tablet, Take 1 tablet (4 mg total) by mouth every 8 (eight) hours as needed for nausea or vomiting., Disp: 90 tablet, Rfl: 1   OneTouch Delica Lancets 30G MISC, Use to test blood sugars daily as directed. DX: E11.9, Disp: 100 each, Rfl: 1   OXYGEN, Inhale 2 L into the lungs daily as needed (with Cpap at night)., Disp: , Rfl:    pantoprazole (PROTONIX) 40 MG tablet, Take 1 tablet (40 mg total) by mouth daily., Disp: 90 tablet, Rfl: 0   pravastatin (PRAVACHOL) 40 MG tablet, TAKE 1 TABLET (40 MG TOTAL) BY MOUTH ON MONDAYS , WEDNESDAYS, AND FRIDAYS AS DIRECTED, Disp: 48 tablet, Rfl: 4   propranolol (INDERAL) 10 MG tablet, Take 1 tablet (10 mg total) by mouth 3 (three) times daily as needed., Disp: 90  tablet, Rfl: 6   torsemide (DEMADEX) 20 MG tablet, Take 0.5 tablets (10 mg total) by mouth daily., Disp: 90 tablet, Rfl: 2   zolpidem (AMBIEN) 5 MG tablet, Take 1 tablet (5 mg total) by mouth at bedtime as needed for sleep., Disp: 30 tablet, Rfl: 2   Allergies: Allergies  Allergen Reactions   Bisoprolol Other (See Comments)    Confusion, delirium    Sulfa Antibiotics Swelling    Mouth swelling   Atorvastatin Other (See Comments)    Muscle aches - tolerating Pravastatin 40 mg MWF   Jardiance [Empagliflozin] Other (See Comments)    FEELS SLUGGISH, TIRED   Lopressor [Metoprolol] Other (See Comments)    Fatigue   Rosuvastatin Other (See Comments)    Muscle aches - tolerating Pravastatin 40 mg MWF   Doxycycline Nausea Only   Temazepam Other (See Comments)    Made insomnia worse    Tramadol Itching    REVIEW OF SYSTEMS:   Review of Systems  Constitutional:  Negative for chills, fatigue and fever.  HENT:   Negative for lump/mass, mouth sores, nosebleeds, sore throat and trouble swallowing.   Eyes:  Negative for eye problems.  Respiratory:  Positive for cough and shortness of breath.   Cardiovascular:  Negative for chest pain, leg swelling and palpitations.  Gastrointestinal:  Positive for nausea. Negative for abdominal pain, constipation, diarrhea and vomiting.  Genitourinary:  Negative for bladder incontinence, difficulty urinating, dysuria, frequency, hematuria and nocturia.   Musculoskeletal:  Negative for arthralgias, back pain, flank pain, myalgias and neck pain.  Skin:  Negative for itching and rash.  Neurological:  Positive for headaches. Negative for dizziness and numbness.  Hematological:  Does not bruise/bleed easily.  Psychiatric/Behavioral:  Negative for depression, sleep disturbance and suicidal ideas. The patient is not nervous/anxious.   All other systems reviewed and are negative.    VITALS:   Blood pressure 126/62, pulse 91, temperature (!) 96.4 F (35.8 C),  temperature source Tympanic, resp. rate 20, weight 148 lb 9.4 oz (67.4 kg), SpO2 98%.  Wt Readings from Last 3 Encounters:  07/15/23 148 lb 9.4 oz (67.4 kg)  07/06/23 146 lb 9.7 oz (66.5 kg)  06/05/23 150 lb 6.4 oz (68.2 kg)    Body mass index is 23.98 kg/m.  Performance status (ECOG): 1 - Symptomatic but completely ambulatory  PHYSICAL EXAM:   Physical Exam Vitals and nursing note reviewed. Exam conducted with a chaperone present.  Constitutional:      Appearance: Normal appearance.  Cardiovascular:     Rate and Rhythm: Normal rate and regular rhythm.     Pulses: Normal pulses.     Heart sounds: Normal heart sounds.  Pulmonary:     Effort: Pulmonary effort is normal.     Breath sounds: Normal breath sounds.  Abdominal:     Palpations: Abdomen is soft. There is no hepatomegaly, splenomegaly or mass.     Tenderness: There is no abdominal tenderness.  Musculoskeletal:     Right lower leg: No edema.     Left lower leg: No edema.  Lymphadenopathy:     Cervical: No cervical adenopathy.     Right cervical: No superficial, deep or posterior cervical adenopathy.    Left cervical: No superficial, deep or posterior cervical adenopathy.     Upper Body:     Right upper body: No supraclavicular or axillary adenopathy.     Left upper body: No supraclavicular or axillary adenopathy.  Neurological:     General: No focal deficit present.     Mental Status: He is alert and oriented to person, place, and time.  Psychiatric:        Mood and Affect: Mood normal.        Behavior: Behavior normal.     LABS:   CBC     Component Value Date/Time   WBC 11.0 (H) 06/29/2023 1521   RBC 4.34 06/29/2023 1521   HGB 13.7 06/29/2023 1521   HGB 13.1 06/05/2023 1230   HCT 43.2 06/29/2023 1521   HCT 39.0 06/05/2023 1230   PLT 331 06/29/2023 1521   PLT 377 06/05/2023 1230   MCV 99.5 06/29/2023 1521   MCV 96 06/05/2023 1230   MCH 31.6 06/29/2023 1521   MCHC 31.7 06/29/2023 1521   RDW 14.3  06/29/2023 1521   RDW 13.6 06/05/2023 1230   LYMPHSABS 0.7 06/29/2023 1521   LYMPHSABS 0.5 (L) 06/05/2023 1230   MONOABS 0.8 06/29/2023 1521   EOSABS 0.3 06/29/2023 1521   EOSABS 0.2 06/05/2023 1230   BASOSABS 0.1 06/29/2023 1521   BASOSABS 0.1 06/05/2023 1230    CMP      Component Value Date/Time   NA 136 06/29/2023 1521  NA 140 06/05/2023 1230   K 5.0 06/29/2023 1521   CL 98 06/29/2023 1521   CO2 28 06/29/2023 1521   GLUCOSE 179 (H) 06/29/2023 1521   BUN 22 06/29/2023 1521   BUN 11 06/05/2023 1230   CREATININE 0.92 06/29/2023 1521   CALCIUM 9.5 06/29/2023 1521   PROT 7.7 06/29/2023 1521   PROT 6.7 06/05/2023 1230   ALBUMIN 3.4 (L) 06/29/2023 1521   ALBUMIN 3.6 (L) 06/05/2023 1230   AST 22 06/29/2023 1521   ALT 19 06/29/2023 1521   ALKPHOS 118 06/29/2023 1521   BILITOT 0.6 06/29/2023 1521   BILITOT 0.3 06/05/2023 1230   GFRNONAA >60 06/29/2023 1521   GFRAA 102 05/24/2020 1238     No results found for: "CEA1", "CEA" / No results found for: "CEA1", "CEA" Lab Results  Component Value Date   PSA1 <0.1 10/31/2022   No results found for: "JXB147" No results found for: "WGN562"  Lab Results  Component Value Date   TOTALPROTELP 7.0 04/18/2021   ALBUMINELP 3.7 04/18/2021   A1GS 0.3 04/18/2021   A2GS 0.8 04/18/2021   BETS 1.2 04/18/2021   GAMS 1.0 04/18/2021   MSPIKE Not Observed 04/18/2021   SPEI Comment 04/18/2021   Lab Results  Component Value Date   TIBC 332 06/29/2023   TIBC 425 03/24/2023   TIBC 541 (H) 02/17/2023   FERRITIN 185 06/29/2023   FERRITIN 29 03/24/2023   FERRITIN 10 (L) 02/17/2023   IRONPCTSAT 25 06/29/2023   IRONPCTSAT 49 (H) 03/24/2023   IRONPCTSAT 4 (L) 02/17/2023   Lab Results  Component Value Date   LDH 133 08/02/2018   LDH 126 03/08/2018   LDH 141 10/28/2017     STUDIES:   NM PET Image Restag (PS) Skull Base To Thigh Result Date: 07/15/2023 CLINICAL DATA:  Subsequent treatment strategy for non-small cell lung cancer. EXAM:  NUCLEAR MEDICINE PET SKULL BASE TO THIGH TECHNIQUE: 7.0 mCi F-18 FDG was injected intravenously. Full-ring PET imaging was performed from the skull base to thigh after the radiotracer. CT data was obtained and used for attenuation correction and anatomic localization. Fasting blood glucose: 135 mg/dl COMPARISON:  CT chest 13/11/6576, 03/24/2023, CT abdomen pelvis 02/17/2023 and PET 05/15/2022. FINDINGS: Mediastinal blood pool activity: SUV max 2.3 Liver activity: SUV max NA NECK: No abnormal hypermetabolism. Incidental CT findings: None. CHEST: Hypermetabolic left upper lobe mass measures 3.5 x 5.1 cm, SUV max 10.8. Additional hypermetabolic nodules in the right lung, both within post radiation consolidation in the right upper lobe, SUV max 10.2, and within the medial right lower lobe, measuring 2.0 x 2.4 cm (203/43), SUV max 8.4. No hypermetabolic adenopathy. Incidental CT findings: Left IJ Port-A-Cath terminates in the SVC. Atherosclerotic calcification of the aorta, aortic valve and coronary arteries. Heart is at the upper limits of normal in size to mildly enlarged. No pericardial effusion. Centrilobular and paraseptal emphysema. 6 mm lateral left lower lobe nodule (203/47), too small for PET resolution. Trace loculated right pleural effusion. Debris in the airway. ABDOMEN/PELVIS: No abnormal hypermetabolism. Incidental CT findings: Liver margin is slightly irregular. Gallstone. Liver, gallbladder, adrenal glands, kidneys, spleen, pancreas, stomach and bowel are otherwise grossly unremarkable. Bladder wall thickening. Prostate appears to be surgically absent. SKELETON: No abnormal hypermetabolism. Incidental CT findings: Degenerative changes in the spine. IMPRESSION: 1. Bilateral upper lobe and right lower lobe disease recurrence. No evidence of distant metastatic disease. 2. 6 mm lateral left lower lobe nodule, too small for PET resolution. Recommend attention on follow-up. 3. Trace  loculated right pleural  effusion. 4. Cirrhosis. 5. Cholelithiasis. 6. Aortic atherosclerosis (ICD10-I70.0). Coronary artery calcification. 7.  Emphysema (ICD10-J43.9). Electronically Signed   By: Leanna Battles M.D.   On: 07/15/2023 08:54   CT CHEST W CONTRAST Result Date: 07/05/2023 CLINICAL DATA:  Non-small cell lung cancer, monitor. * Tracking Code: BO * EXAM: CT CHEST WITH CONTRAST TECHNIQUE: Multidetector CT imaging of the chest was performed during intravenous contrast administration. RADIATION DOSE REDUCTION: This exam was performed according to the departmental dose-optimization program which includes automated exposure control, adjustment of the mA and/or kV according to patient size and/or use of iterative reconstruction technique. CONTRAST:  80mL OMNIPAQUE IOHEXOL 300 MG/ML  SOLN COMPARISON:  Chest CT 03/24/2023 and 12/16/2022.  PET-CT 05/15/2022. FINDINGS: Cardiovascular: No acute vascular findings are identified. There is suboptimal opacification of the pulmonary arteries, without evidence of acute pulmonary embolism. Extensive atherosclerosis of the aorta, great vessels and coronary arteries again noted. Left subclavian Port-A-Cath extends to the superior cavoatrial junction. There are calcifications of the aortic valve and mitral annulus. The heart size is normal. There is no pericardial effusion. Mediastinum/Nodes: There are no enlarged mediastinal, hilar or axillary lymph nodes. The thyroid gland, trachea and esophagus demonstrate no significant findings. Lungs/Pleura: Interval resolution of previously demonstrated right-sided pleural effusion. No pneumothorax. Stable treatment changes with architectural distortion in the right perihilar region. Nodule in the superior segment of the right lower lobe appears slightly larger (best seen on the soft tissue windows). This measures 2.4 x 1.4 cm on image 66/2 and demonstrates central necrosis (previously 1.8 x 1.4 cm). Chronic treatment changes are present within the left  upper lobe with interval increased soft tissue density measuring up to 4.2 x 3.5 cm on image 29/3. This is concerning for local recurrence in this region. Unchanged cephalad 0.7 cm left upper lobe nodule on image 19/3 and 7 mm left lower lobe nodule on image 75/3 are unchanged. No other enlarging or new nodules are identified. Upper abdomen: Stable appearance of the visualized upper abdomen, without significant findings. Musculoskeletal/Chest wall: There is no chest wall mass or suspicious osseous finding. Stable mild bilateral gynecomastia. Loop recorder within the left anterior chest wall. IMPRESSION: 1. Interval increased soft tissue density within the treatment bed in the left upper lobe, concerning for local recurrence. Slight interval increase in size of necrotic nodule in the superior segment of the right lower lobe, also suspicious for tumor progression. Consider PET-CT for further evaluation. 2. Stable additional small left lung nodules. Interval resolution of right-sided pleural effusion. 3. No evidence of metastatic adenopathy or acute thoracic findings. 4. Aortic Atherosclerosis (ICD10-I70.0) and Emphysema (ICD10-J43.9). Electronically Signed   By: Carey Bullocks M.D.   On: 07/05/2023 10:17   CUP PACEART REMOTE DEVICE CHECK Result Date: 06/29/2023 ILR summary report received. Battery status OK. Normal device function. No new symptom, brady, or pause episodes. No new AF episodes.  6 tachy episodes consistent with atrial driven tachycardia and ST.  2 AT episodes, longest 10 hours, known history. Monthly summary reports and ROV/PRN - CS, CVRS

## 2023-07-15 ENCOUNTER — Inpatient Hospital Stay

## 2023-07-15 ENCOUNTER — Inpatient Hospital Stay: Attending: Hematology | Admitting: Hematology

## 2023-07-15 VITALS — BP 126/62 | HR 91 | Temp 96.4°F | Resp 20 | Wt 148.6 lb

## 2023-07-15 DIAGNOSIS — Z85118 Personal history of other malignant neoplasm of bronchus and lung: Secondary | ICD-10-CM | POA: Diagnosis not present

## 2023-07-15 DIAGNOSIS — Z08 Encounter for follow-up examination after completed treatment for malignant neoplasm: Secondary | ICD-10-CM | POA: Insufficient documentation

## 2023-07-15 DIAGNOSIS — R918 Other nonspecific abnormal finding of lung field: Secondary | ICD-10-CM | POA: Diagnosis not present

## 2023-07-15 DIAGNOSIS — C3491 Malignant neoplasm of unspecified part of right bronchus or lung: Secondary | ICD-10-CM

## 2023-07-15 DIAGNOSIS — D539 Nutritional anemia, unspecified: Secondary | ICD-10-CM | POA: Insufficient documentation

## 2023-07-15 MED ORDER — MEGESTROL ACETATE 400 MG/10ML PO SUSP: 400.0000 mg | Freq: Two times a day (BID) | ORAL | 3 refills | Status: DC

## 2023-07-15 NOTE — Patient Instructions (Signed)
 [facility] at [facility] Discharge Instructions   You were seen and examined today by Dr. Ellin Saba.  He reviewed the results of your PET scan. It is showing a mass lighting up in the upper lobe of your left lung, as well as a mass lighting up in the lower lobe of your right lung. There are no other areas of cancer in the body.   We will arrange for you to have a biopsy of the mass in your left lung. We will send special testing on the biopsy to identify if this cancer has any mutations that we can target for treatment. We will also do a blood test today called Guardant 360 for the same reason.   Return as scheduled.    Thank you for choosing [facility] at [facility] to provide your oncology and hematology care.  To afford each patient quality time with our provider, please arrive at least 15 minutes before your scheduled appointment time.   If you have a lab appointment with the Cancer Center please come in thru the Main Entrance and check in at the main information desk.  You need to re-schedule your appointment should you arrive 10 or more minutes late.  We strive to give you quality time with our providers, and arriving late affects you and other patients whose appointments are after yours.  Also, if you no show three or more times for appointments you may be dismissed from the clinic at the providers discretion.     Again, thank you for choosing [facility].  Our hope is that these requests will decrease the amount of time that you wait before being seen by our physicians.       _____________________________________________________________  Should you have questions after your visit to [facility], please contact our office at [phone] and follow the prompts.  Our office hours are 8:00 a.m. and 4:30 p.m. Monday - Friday.  Please note that voicemails left after 4:00 p.m. may not be returned until the following  business day.  We are closed weekends and major holidays.  You do have access to a nurse 24-7, just call the main number to the clinic [phone] and do not press any options, hold on the line and a nurse will answer the phone.    For prescription refill requests, have your pharmacy contact our office and allow 72 hours.    Due to Covid, you will need to wear a mask upon entering the hospital. If you do not have a mask, a mask will be given to you at the Main Entrance upon arrival. For doctor visits, patients may have 1 support person age 16 or older with them. For treatment visits, patients can not have anyone with them due to social distancing guidelines and our immunocompromised population.

## 2023-07-16 ENCOUNTER — Telehealth: Payer: Self-pay

## 2023-07-16 NOTE — Telephone Encounter (Signed)
 Notified Patient of prior authorization approval of Megestrol Acetate 400mg /24ml Suspension. Medication is approved until further notice. Pharmacy notified. No other needs or concerns noted at this time.

## 2023-07-22 ENCOUNTER — Ambulatory Visit (HOSPITAL_COMMUNITY)
Admission: RE | Admit: 2023-07-22 | Discharge: 2023-07-22 | Disposition: A | Discharge status: Home / Self Care | Source: Ambulatory Visit | Attending: Hematology | Admitting: Hematology

## 2023-07-22 DIAGNOSIS — I6782 Cerebral ischemia: Secondary | ICD-10-CM | POA: Diagnosis not present

## 2023-07-22 DIAGNOSIS — C3491 Malignant neoplasm of unspecified part of right bronchus or lung: Secondary | ICD-10-CM | POA: Diagnosis not present

## 2023-07-22 DIAGNOSIS — G319 Degenerative disease of nervous system, unspecified: Secondary | ICD-10-CM | POA: Diagnosis not present

## 2023-07-22 MED ORDER — GADOBUTROL 1 MMOL/ML IV SOLN
10.0000 mL | Freq: Once | INTRAVENOUS | Status: AC | PRN
  Administered 2023-07-22: 10 mL via INTRAVENOUS

## 2023-07-25 DIAGNOSIS — C3491 Malignant neoplasm of unspecified part of right bronchus or lung: Secondary | ICD-10-CM | POA: Diagnosis not present

## 2023-07-27 ENCOUNTER — Encounter: Payer: Self-pay | Admitting: Acute Care

## 2023-07-27 ENCOUNTER — Encounter: Payer: Self-pay | Admitting: Emergency Medicine

## 2023-07-27 ENCOUNTER — Ambulatory Visit (INDEPENDENT_AMBULATORY_CARE_PROVIDER_SITE_OTHER): Admitting: Acute Care

## 2023-07-27 VITALS — BP 155/78 | HR 95 | Ht 66.0 in | Wt 155.2 lb

## 2023-07-27 DIAGNOSIS — F17200 Nicotine dependence, unspecified, uncomplicated: Secondary | ICD-10-CM

## 2023-07-27 DIAGNOSIS — Z85118 Personal history of other malignant neoplasm of bronchus and lung: Secondary | ICD-10-CM | POA: Diagnosis not present

## 2023-07-27 DIAGNOSIS — F17210 Nicotine dependence, cigarettes, uncomplicated: Secondary | ICD-10-CM

## 2023-07-27 DIAGNOSIS — J441 Chronic obstructive pulmonary disease with (acute) exacerbation: Secondary | ICD-10-CM

## 2023-07-27 DIAGNOSIS — R918 Other nonspecific abnormal finding of lung field: Secondary | ICD-10-CM

## 2023-07-27 MED ORDER — AZITHROMYCIN 250 MG PO TABS: ORAL_TABLET | ORAL | 0 refills | Status: DC

## 2023-07-27 MED ORDER — PREDNISONE 10 MG PO TABS
ORAL_TABLET | ORAL | 0 refills | Status: DC
Start: 2023-07-27 — End: 2023-07-30

## 2023-07-27 MED ORDER — TRELEGY ELLIPTA 200-62.5-25 MCG/ACT IN AEPB
1.0000 | INHALATION_SPRAY | Freq: Every day | RESPIRATORY_TRACT | 6 refills | Status: DC
Start: 2023-07-27 — End: 2023-07-30

## 2023-07-27 MED ORDER — TRELEGY ELLIPTA 200-62.5-25 MCG/ACT IN AEPB: 1.0000 | INHALATION_SPRAY | Freq: Every day | RESPIRATORY_TRACT | Status: DC

## 2023-07-27 NOTE — H&P (View-Only) (Signed)
 History of Present Illness John [name] is a 78 y.o. male current every day smoker with a history of  Stage IIIa (T1b N2 cM0) NSCLC, favoring adenocarcinoma: diagnosed 08/20/2016. Recent PET scan shows concern for recurrence. Referred to  Dr. Baldwin [name] 07/2023 for consideration of bronchoscopy with biopsies.  Prior Oncology Treatment Stage IIIa (T1b N2 cM0) NSCLC, favoring adenocarcinoma: -Chemo XRT with weekly carboplatin  and paclitaxel  completed on 08/20/2016. -Consolidation durvalumab  from 09/26/2016 through 09/25/2017. -Reviewed CT chest on 11/07/2019 which showed increasing size of the upper lobe pulmonary nodules with no new nodule in the left upper lobe suspicious for meta stasis.  Also increasing confluent soft tissue in the right suprahilar region, thought to be postradiation changes. -PET scan on 11/21/2019 showed suspected radiation changes in the right paramediastinal/perihilar region although mild hypermetabolism is present in the right retroareolar region.SUV 2.5.  While progressive soft tissue is present in this region, this hypermetabolism remains nonfocal and favors radiation changes.  7 mm central left upper lobe nodule max SUV 1.4.  9 mm aggregate nodule in the lateral right upper lobe max SUV 2.4.  These are minimally progressive from more remote prior scans.  6 mm left lower lobe nodule unchanged.  No hypermetabolic thoracic adenopathy. -CT chest on 03/23/2020 showed many of the lung nodules are stable.  Couple of nodules have grown by few millimeters.  No new areas seen. - SBRT to the right and left lung lesions, 54 Gray in 3 fractions from 09/10/2020 through 09/17/2020. - SBRT to the RUL and LUL lesions from 06/19/2022 through 06/26/2022 by Dr. Eloise [name]. - CT Chest 07/05/2023 CT Chest showed suspected bilateral upper lobe and right lower lobe disease recurrence. No evidence of distant metastatic disease - PET scan 07/15/2023 showed Bilateral upper lobe and right lower lobe disease recurrence. No  evidence of distant metastatic disease. - 07/2023 Referred to [facility] for consideration of bronchoscopy with biopsies.     07/27/2023 Pt. Presents for initial OV regarding his  suspected recurrent lung cancer. He is continuing to smoke 1/2 PPD.Pt. has a history of Stage IIIa (T1b N2 cM0) NSCLC, favoring adenocarcinoma first diagnosed 05/2016  John [name] did biopsy)  He was diagnosed with adenocarcinoma of the lung in February 2018 following a biopsy. He underwent treatment as noted above, A recent PET scan showed suspected recurrence in the bilateral upper lobes and right lower lobe.  He has a history of COPD, which exacerbates his breathing difficulties, particularly during pollen season. He uses a CPAP machine with oxygen  at night and a home oxygen  concentrator, which he resumed about a week ago due to a COPD flare. He was last seen by a lung doctor in July 2023 and treated for an acute exacerbation of COPD in June 2023 with azithromycin  and prednisone . He uses Trelegy 100 for COPD and has been given a sample of Trelegy 200 to assess symptom control. He also uses ProAir  as needed .  He has experienced significant weight loss, dropping about 20 pounds in the last 3 months.  He started  appetite stimulant started about a week ago has improved his appetite, and he has less nausea since starting the medication. He has a history of diabetes managed with insulin , currently taking 14 units at night. He understands he will need to adjust his insulin  dosage prior to his biopsy.He is also on Plavix  due to stents placed in his legs. He understands he will need to be off his Plavix  x 5 full days prior to the biopsy.  He verbalized understanding of this.   He was counseled to quit smoking x 4 minutes . He is smoking 1/2 PPD. He is currently experiencing a flare with the current pollen.  I will treat with a z pack and prednisone  taper to oprimize him for bronchoscopy with biopsies.  I explained that this  will increase blood sugars. He is aware he will need to monitor this closely.    Test Results:  - PET scan from 07/09/2023: Left upper lobe mass measures 3.5 x 5.1 cm with SUV 10.8.  Additional hypermetabolic nodules in the right lung both within the postradiation consolidation in the right upper lobe SUV 10.2 and within the medial right lower lobe SUV 8.4 measuring 2 x 2.4 cm.  No hypermetabolic adenopathy. Referred to pulmonary for biopsy   CT Chest 06/2023 Interval increased soft tissue density within the treatment bed in the left upper lobe, concerning for local recurrence. Slight interval increase in size of necrotic nodule in the superior segment of the right lower lobe, also suspicious for tumor progression. Consider PET-CT for further evaluation. 2. Stable additional small left lung nodules. Interval resolution of right-sided pleural effusion. 3. No evidence of metastatic adenopathy or acute thoracic findings. 4. Aortic Atherosclerosis (ICD10-I70.0) and Emphysema (ICD10-J43.9).      Latest Ref Rng & Units 06/29/2023    3:21 PM 06/05/2023   12:30 PM 05/31/2023    6:07 AM  CBC  WBC 4.0 - 10.5 K/uL 11.0  8.4  10.0   Hemoglobin 13.0 - 17.0 g/dL 16.1  09.6  04.5   Hematocrit 39.0 - 52.0 % 43.2  39.0  36.7   Platelets 150 - 400 K/uL 331  377  302        Latest Ref Rng & Units 06/29/2023    3:21 PM 06/05/2023   12:30 PM 05/31/2023    6:07 AM  BMP  Glucose 70 - 99 mg/dL 409  811  914   BUN 8 - 23 mg/dL 22  11  12    Creatinine 0.61 - 1.24 mg/dL 7.82  9.56  2.13   BUN/Creat Ratio 10 - 24  15    Sodium 135 - 145 mmol/L 136  140  136   Potassium 3.5 - 5.1 mmol/L 5.0  4.5  3.9   Chloride 98 - 111 mmol/L 98  96  102   CO2 22 - 32 mmol/L 28  27  25    Calcium 8.9 - 10.3 mg/dL 9.5  9.7  9.2     BNP    Component Value Date/Time   BNP 104.3 (H) 02/13/2023 1233   BNP 191.0 (H) 10/10/2021 1705    ProBNP No results found for: "PROBNP"  PFT No results found for: "FEV1PRE",  "FEV1POST", "FVCPRE", "FVCPOST", "TLC", "DLCOUNC", "PREFEV1FVCRT", "PSTFEV1FVCRT"  MR Brain W Wo Contrast Result Date: 07/22/2023 CLINICAL DATA:  Provided history: Non-small cell lung cancer, right. Metastatic disease evaluation. EXAM: MRI HEAD WITHOUT AND WITH CONTRAST TECHNIQUE: Multiplanar, multiecho pulse sequences of the brain and surrounding structures were obtained without and with intravenous contrast. CONTRAST:  10mL GADAVIST  GADOBUTROL  1 MMOL/ML IV SOLN COMPARISON:  Head CT 01/28/2022.  Brain MRI 03/25/2019. FINDINGS: Brain: Moderate generalized cerebral atrophy.  Mild cerebellar atrophy. Multifocal T2 FLAIR hyperintense signal abnormality within the cerebral white matter, nonspecific but compatible with moderate chronic small vessel ischemic disease. Small chronic lacunar infarct within the left thalamus, new from the prior MRI (series 14, image 13). There is no acute infarct. No evidence of an intracranial  mass. No chronic intracranial blood products. No extra-axial fluid collection. No midline shift. No pathologic intracranial enhancement identified. Vascular: Maintained flow voids within the proximal large arterial vessels. Skull and upper cervical spine: No focal worrisome marrow lesion. Incompletely assessed cervical spondylosis. Sinuses/Orbits: No mass or acute finding within the imaged orbits. Prior bilateral ocular lens replacement. No significant paranasal sinus disease. IMPRESSION: 1.  No evidence of an acute intracranial abnormality. 2. Moderate cerebral white matter chronic small vessel ischemic disease, similar to the prior brain MRI of 03/25/2019. 3. Small chronic lacunar infarct within the left thalamus, new from the prior MRI. 4. Moderate generalized cerebral atrophy 5. Mild cerebellar atrophy. Electronically Signed   By: Bascom Lily D.O.   On: 07/22/2023 17:23   NM PET Image Restag (PS) Skull Base To Thigh Result Date: 07/15/2023 CLINICAL DATA:  Subsequent treatment strategy for  non-small cell lung cancer. EXAM: NUCLEAR MEDICINE PET SKULL BASE TO THIGH TECHNIQUE: 7.0 mCi F-18 FDG was injected intravenously. Full-ring PET imaging was performed from the skull base to thigh after the radiotracer. CT data was obtained and used for attenuation correction and anatomic localization. Fasting blood glucose: 135 mg/dl COMPARISON:  CT chest 86/57/8469, 03/24/2023, CT abdomen pelvis 02/17/2023 and PET 05/15/2022. FINDINGS: Mediastinal blood pool activity: SUV max 2.3 Liver activity: SUV max NA NECK: No abnormal hypermetabolism. Incidental CT findings: None. CHEST: Hypermetabolic left upper lobe mass measures 3.5 x 5.1 cm, SUV max 10.8. Additional hypermetabolic nodules in the right lung, both within post radiation consolidation in the right upper lobe, SUV max 10.2, and within the medial right lower lobe, measuring 2.0 x 2.4 cm (203/43), SUV max 8.4. No hypermetabolic adenopathy. Incidental CT findings: Left IJ Port-A-Cath terminates in the SVC. Atherosclerotic calcification of the aorta, aortic valve and coronary arteries. Heart is at the upper limits of normal in size to mildly enlarged. No pericardial effusion. Centrilobular and paraseptal emphysema. 6 mm lateral left lower lobe nodule (203/47), too small for PET resolution. Trace loculated right pleural effusion. Debris in the airway. ABDOMEN/PELVIS: No abnormal hypermetabolism. Incidental CT findings: Liver margin is slightly irregular. Gallstone. Liver, gallbladder, adrenal glands, kidneys, spleen, pancreas, stomach and bowel are otherwise grossly unremarkable. Bladder wall thickening. Prostate appears to be surgically absent. SKELETON: No abnormal hypermetabolism. Incidental CT findings: Degenerative changes in the spine. IMPRESSION: 1. Bilateral upper lobe and right lower lobe disease recurrence. No evidence of distant metastatic disease. 2. 6 mm lateral left lower lobe nodule, too small for PET resolution. Recommend attention on follow-up. 3.  Trace loculated right pleural effusion. 4. Cirrhosis. 5. Cholelithiasis. 6. Aortic atherosclerosis (ICD10-I70.0). Coronary artery calcification. 7.  Emphysema (ICD10-J43.9). Electronically Signed   By: Shearon Denis M.D.   On: 07/15/2023 08:54   CT CHEST W CONTRAST Result Date: 07/05/2023 CLINICAL DATA:  Non-small cell lung cancer, monitor. * Tracking Code: BO * EXAM: CT CHEST WITH CONTRAST TECHNIQUE: Multidetector CT imaging of the chest was performed during intravenous contrast administration. RADIATION DOSE REDUCTION: This exam was performed according to the departmental dose-optimization program which includes automated exposure control, adjustment of the mA and/or kV according to patient size and/or use of iterative reconstruction technique. CONTRAST:  80mL OMNIPAQUE  IOHEXOL  300 MG/ML  SOLN COMPARISON:  Chest CT 03/24/2023 and 12/16/2022.  PET-CT 05/15/2022. FINDINGS: Cardiovascular: No acute vascular findings are identified. There is suboptimal opacification of the pulmonary arteries, without evidence of acute pulmonary embolism. Extensive atherosclerosis of the aorta, great vessels and coronary arteries again noted. Left subclavian Port-A-Cath extends to the  superior cavoatrial junction. There are calcifications of the aortic valve and mitral annulus. The heart size is normal. There is no pericardial effusion. Mediastinum/Nodes: There are no enlarged mediastinal, hilar or axillary lymph nodes. The thyroid  gland, trachea and esophagus demonstrate no significant findings. Lungs/Pleura: Interval resolution of previously demonstrated right-sided pleural effusion. No pneumothorax. Stable treatment changes with architectural distortion in the right perihilar region. Nodule in the superior segment of the right lower lobe appears slightly larger (best seen on the soft tissue windows). This measures 2.4 x 1.4 cm on image 66/2 and demonstrates central necrosis (previously 1.8 x 1.4 cm). Chronic treatment changes  are present within the left upper lobe with interval increased soft tissue density measuring up to 4.2 x 3.5 cm on image 29/3. This is concerning for local recurrence in this region. Unchanged cephalad 0.7 cm left upper lobe nodule on image 19/3 and 7 mm left lower lobe nodule on image 75/3 are unchanged. No other enlarging or new nodules are identified. Upper abdomen: Stable appearance of the visualized upper abdomen, without significant findings. Musculoskeletal/Chest wall: There is no chest wall mass or suspicious osseous finding. Stable mild bilateral gynecomastia. Loop recorder within the left anterior chest wall. IMPRESSION: 1. Interval increased soft tissue density within the treatment bed in the left upper lobe, concerning for local recurrence. Slight interval increase in size of necrotic nodule in the superior segment of the right lower lobe, also suspicious for tumor progression. Consider PET-CT for further evaluation. 2. Stable additional small left lung nodules. Interval resolution of right-sided pleural effusion. 3. No evidence of metastatic adenopathy or acute thoracic findings. 4. Aortic Atherosclerosis (ICD10-I70.0) and Emphysema (ICD10-J43.9). Electronically Signed   By: Elmon Hagedorn M.D.   On: 07/05/2023 10:17   CUP PACEART REMOTE DEVICE CHECK Result Date: 06/29/2023 ILR summary report received. Battery status OK. Normal device function. No new symptom, brady, or pause episodes. No new AF episodes.  6 tachy episodes consistent with atrial driven tachycardia and ST.  2 AT episodes, longest 10 hours, known history. Monthly summary reports and ROV/PRN - CS, CVRS    Past medical hx Past Medical History:  Diagnosis Date   Adenocarcinoma of lung, right (HCC) 04/18/2016   Anemia    Anxiety    Arthritis    Asthma    Atrial flutter (HCC)    BPH (benign prostatic hyperplasia)    with urinary retention 02/06/20   CHF (congestive heart failure) (HCC)    COPD (chronic obstructive pulmonary  disease) (HCC)    Depression    Diabetes mellitus, type II (HCC)    Dyspnea    History of kidney stones    History of radiation therapy    right lung 09/10/2020-09/17/2020   Dr John [name]   History of radiation therapy    Left and right Lung- 06/19/22-06/26/22   Hyperlipidemia    Hypertension    Hypothyroidism    Macular degeneration    Neuropathy    Non-small cell lung cancer, right (HCC) 04/18/2016   Peripheral vascular disease (HCC)    Prostatitis    Pulmonary nodule, left 07/16/2016   Sleep apnea    cpap     Social History   Tobacco Use   Smoking status: Every Day    Current packs/day: 0.50    Average packs/day: 0.5 packs/day for 62.4 years (31.2 ttl pk-yrs)    Types: Cigarettes    Start date: 03/11/1961   Smokeless tobacco: Never   Tobacco comments:    1/2 pack to 1  pack per day-07/27/2023   Vaping Use   Vaping status: Never Used  Substance Use Topics   Alcohol use: No   Drug use: No    John [name] reports that he has been smoking cigarettes. He started smoking about 62 years ago. He has a 31.2 pack-year smoking history. He has never used smokeless tobacco. He reports that he does not drink alcohol and does not use drugs.  Tobacco Cessation: Ready to quit: Not Answered Counseling given: Not Answered Tobacco comments: 1/2 pack to 1 pack per day-07/27/2023   Still smoking 1/2 PPD , counseled to quit x 3-4 minutes. Past surgical hx, Family hx, Social hx all reviewed.  Current Outpatient Medications on File Prior to Visit  Medication Sig   albuterol  (VENTOLIN  HFA) 108 (90 Base) MCG/ACT inhaler INHALE 2 PUFFS INTO THE LUNGS EVERY 6 HOURS AS NEEDED FOR WHEEZE OR SHORTNESS OF BREATH   allopurinol  (ZYLOPRIM ) 300 MG tablet TAKE 1 TABLET BY MOUTH EVERY DAY   bisoprolol  (ZEBETA ) 5 MG tablet Take 5 mg by mouth daily.   Blood Glucose Monitoring Suppl (ONETOUCH VERIO REFLECT) w/Device KIT Use to test blood sugars daily as directed. DX: E11.9   clopidogrel  (PLAVIX ) 75 MG tablet  Take 1 tablet (75 mg total) by mouth daily.   Continuous Glucose Receiver (FREESTYLE LIBRE 3 READER) DEVI 1 Device by Does not apply route every 14 (fourteen) days.   Continuous Glucose Sensor (FREESTYLE LIBRE 3 SENSOR) MISC 1 Device by Does not apply route continuous.   diltiazem  (CARDIZEM  CD) 120 MG 24 hr capsule Take by mouth daily as needed.   docusate sodium  (COLACE) 100 MG capsule Take 1 capsule (100 mg total) by mouth daily as needed for mild constipation.   dutasteride  (AVODART ) 0.5 MG capsule Take 1 capsule (0.5 mg total) by mouth daily.   famotidine  (PEPCID ) 20 MG tablet Take 1 tablet (20 mg total) by mouth daily.   ferrous sulfate  325 (65 FE) MG EC tablet Take 1 tablet (325 mg total) by mouth daily.   Fluticasone -Umeclidin-Vilant (TRELEGY ELLIPTA ) 100-62.5-25 MCG/ACT AEPB Inhale 1 puff into the lungs daily.   gabapentin  (NEURONTIN ) 300 MG capsule Take 1-2 capsules (300-600 mg total) by mouth 2 (two) times daily. Take 300mg  (1 cap) in the morning and 600mg  (2 caps) in the evening for a total daily dose of 900mg .   Insulin  Degludec FlexTouch 200 UNIT/ML SOPN Inject 16 units subcutaneus at bedtime as directed by the provider   Insulin  Pen Needle (BD PEN NEEDLE NANO U/F) 32G X 4 MM MISC Use to inject insulin  once daily   levothyroxine  (SYNTHROID ) 50 MCG tablet Take 1 tablet (50 mcg total) by mouth daily.   linaclotide  (LINZESS ) 72 MCG capsule Take 1 capsule (72 mcg total) by mouth daily before breakfast.   megestrol  (MEGACE ) 400 MG/10ML suspension Take 10 mLs (400 mg total) by mouth 2 (two) times daily.   metolazone  (ZAROXOLYN ) 2.5 MG tablet Take 2.5 mg by mouth 3 (three) times a week.   NON FORMULARY CPAP at bedtime   ondansetron  (ZOFRAN ) 4 MG tablet Take 1 tablet (4 mg total) by mouth every 8 (eight) hours as needed for nausea or vomiting.   OneTouch Delica Lancets 30G MISC Use to test blood sugars daily as directed. DX: E11.9   OXYGEN  Inhale 2 L into the lungs daily as needed (with Cpap  at night).   pantoprazole  (PROTONIX ) 40 MG tablet Take 1 tablet (40 mg total) by mouth daily.   pravastatin  (PRAVACHOL ) 40 MG tablet  TAKE 1 TABLET (40 MG TOTAL) BY MOUTH ON MONDAYS , WEDNESDAYS, AND FRIDAYS AS DIRECTED   propranolol  (INDERAL ) 10 MG tablet Take 1 tablet (10 mg total) by mouth 3 (three) times daily as needed.   spironolactone  (ALDACTONE ) 25 MG tablet Take 25 mg by mouth 2 (two) times daily.   torsemide  (DEMADEX ) 20 MG tablet Take 0.5 tablets (10 mg total) by mouth daily.   zolpidem  (AMBIEN ) 5 MG tablet Take 1 tablet (5 mg total) by mouth at bedtime as needed for sleep.   No current facility-administered medications on file prior to visit.     Allergies  Allergen Reactions   Bisoprolol  Other (See Comments)    Confusion, delirium    Sulfa  Antibiotics Swelling    Mouth swelling   Atorvastatin Other (See Comments)    Muscle aches - tolerating Pravastatin  40 mg MWF   Jardiance  [Empagliflozin ] Other (See Comments)    FEELS SLUGGISH, TIRED   Lopressor  [Metoprolol ] Other (See Comments)    Fatigue   Rosuvastatin Other (See Comments)    Muscle aches - tolerating Pravastatin  40 mg MWF   Doxycycline  Nausea Only   Temazepam  Other (See Comments)    Made insomnia worse    Tramadol  Itching    Review Of Systems:  Constitutional:   +  weight loss, No night sweats,  Fevers, chills, fatigue, or  lassitude.  HEENT:   No headaches,  Difficulty swallowing,  Tooth/dental problems, or  Sore throat,                No sneezing, itching, ear ache, nasal congestion, post nasal drip,   CV:  No chest pain,  Orthopnea, PND, swelling in lower extremities, anasarca, dizziness, palpitations, syncope.   GI  No heartburn, indigestion, abdominal pain, nausea, vomiting, diarrhea, change in bowel habits, loss of appetite, bloody stools.   Resp: + shortness of breath with exertion or at rest.  + excess mucus, + productive cough,  No non-productive cough,  No coughing up of blood.  + change in color  of mucus.  + wheezing.  No chest wall deformity  Skin: no rash or lesions.  GU: no dysuria, change in color of urine, no urgency or frequency.  No flank pain, no hematuria   MS:  No joint pain or swelling.  No decreased range of motion.  No back pain.  Psych:  No change in mood or affect. No depression or anxiety.  No memory loss.   Vital Signs BP (!) 155/78 (BP Location: Right Arm, Patient Position: Sitting, Cuff Size: Normal)   Pulse 95   Ht 5\' 6"  (1.676 m)   Wt 155 lb 3.2 oz (70.4 kg)   SpO2 95%   BMI 25.05 kg/m    Physical Exam:  General- No distress,  A&Ox3, pleasant  ENT: No sinus tenderness, TM clear, pale nasal mucosa, no oral exudate,no post nasal drip, no LAN Cardiac: S1, S2, regular rate and rhythm, no murmur Chest: + wheeze/ No rales/ dullness; no accessory muscle use, no nasal flaring, no sternal retractions, diminished per bases, prolonged expiratory phase Abd.: Soft Non-tender, ND, BS +, Body mass index is 25.05 kg/m.  Ext: No clubbing cyanosis, edema, no obvious deformities Neuro: physical deconditioning, in wheelchair,  MAE x 4, A&O x 3, appropriate Skin: No rashes, warm and dry, neck lesion I have asked he follow up with dermatology about.  Psych: normal mood and behavior   Assessment/Plan Lung adenocarcinoma - Suspected recurrence in bilateral lungs upper lobes and right  lower lobe.  - Schedule bronchoscopy to determine if cancer recurrence - Ensure he stops Plavix  5 days before procedure. - Coordinate with Dr. Fulton Job regarding Plavix  washout x 5 days  Chronic Obstructive Pulmonary Disease (COPD) with acute exacerbation  Chronic condition with exacerbations due to pollen. Uses oxygen  as needed , Maintenance is Trelegy 100 -  Trelegy 200 trial for efficacy. - Prescribe Trelegy 200 and provide samples to assess efficacy. - Prescribe prednisone  and azithromycin  for acute exacerbation. - Advise him to monitor blood glucose levels closely while on  prednisone . - Instruct him to wear a mask when exposed to pollen.  Diabetes Mellitus Elevated blood glucose on insulin , particularly on prednisone . Aware of insulin  adjustment for procedures. - Advise him to consult with anesthesiologist regarding insulin  dose adjustment before bronchoscopy.  Peripheral Artery Disease (PAD) Has leg stents, on Plavix .  - Ensure Plavix  is stopped 5 days before bronchoscopy. - Will clear with Dr.Clark vascular surgery prior to bronch  Follow-up Necessary to review biopsy results and manage ongoing conditions. - Schedule follow-up appointment one week after bronchoscopy to discuss results.  Bronchoscopy with biopsies  I have placed an order for a bronchoscopy with biopsies.  We have discussed the procedure in detail.  We have reviewed the risks and benefits of the procedure. These include bleeding, infection, puncture of the lung, and adverse reaction to anesthesia. You have agreed to proceed with biopsy to evaluate the areas of the lung that are positive on PET scan.  Your procedure will be done by Dr. Racheal Buddle. You will receive a letter today with date time and information pertaining to the procedure. You will need someone to drive you to the procedure, stay with you during the procedure, and stay with you after the procedure. You will also need someone to stay with you for 24 hours after anesthesia to ensure you have cleared and are doing well. You will follow-up with me 1 week after the procedure to review the results and to ensure you are doing well. I have sent in a prescription for a z pack, and for a prednisone  taper for your COPD flare. I have send in a prescription for Trelegy 200  Take 1 puff once daily  Rinse mouth after use. We will also give you a sample to make sure it helps you before you fill the prescription. Do not take Trelegy 100 when you are taking the 200 mg dose. Please work on quitting smoking. You can receive free nicotine   replacement therapy (patches, gum, or mints) by calling 1-800-QUIT NOW. Please call so we can get you on the path to becoming a non-smoker. I know it is hard, but you can do this!  Hypnosis for smoking cessation  [employer] [phone]  Acupuncture for smoking cessation  [employer] [phone]   Call if you need us  prior to the procedure or if you have any questions at all. Please contact office for sooner follow up if symptoms do not improve or worsen or seek emergency care      MERT SUGIMOTO [address] [facility] [address]   Dear John [name],  The following has been scheduled for you:   Procedure: Bronchoscopy                 Surgeon:  Dr John [name]     Procedure date:  08/03/23 at 3:15 pm  Arrival Time:  12:45pm Location: [facility] [address], Entrance [place], [place] [address]  Additional Information: Please stop the following medications:  Will need to stop plavix  x 5 days prior to procedure  Do not eat anything after midnight - ok to take medications on the day of procedure with only sips of water  You will need someone to drive you home after this procedure & to be with you for the next 24 hours You may receive a call from the hospital a day or two prior to procedure to go over additional instructions & medications  NEXT VISIT:  08/11/23 @ 2:30pm With Dara Ear, NP at    [facility]    [address], [address] [place], [place]  [address]  I spent 35 minutes dedicated to the care of this patient on the date of this encounter to include pre-visit review of records, face-to-face time with the patient discussing conditions above, post visit ordering of testing, clinical documentation with the electronic health record, making appropriate referrals as documented, and communicating necessary information to the patient's healthcare team.    Raejean Bullock, NP 07/27/2023  12:43 PM

## 2023-07-27 NOTE — Patient Instructions (Signed)
 It is good to see you today. I have placed an order for a bronchoscopy with biopsies.  We have discussed the procedure in detail.  We have reviewed the risks and benefits of the procedure. These include bleeding, infection, puncture of the lung, and adverse reaction to anesthesia. You have agreed to proceed with biopsy to evaluate the areas of the lung that are positive on PET scan.  Your procedure will be done by Dr. Racheal Buddle. You will receive a letter today with date time and information pertaining to the procedure. You will need someone to drive you to the procedure, stay with you during the procedure, and stay with you after the procedure. You will also need someone to stay with you for 24 hours after anesthesia to ensure you have cleared and are doing well. You will follow-up with me 1 week after the procedure to review the results and to ensure you are doing well. I have sent in a prescription for a z pack, and for a prednisone  taper for your COPD flare. I have send in a prescription for Trelegy 200  Take 1 puff once daily  Rinse mouth after use. We will also give you a sample to make sure it helps you before you fill the prescription. Do not take Trelegy 100 when you are taking the 200 mg dose. Please work on quitting smoking. You can receive free nicotine  replacement therapy (patches, gum, or mints) by calling 1-800-QUIT NOW. Please call so we can get you on the path to becoming a non-smoker. I know it is hard, but you can do this!  Hypnosis for smoking cessation  [employer] [phone]  Acupuncture for smoking cessation  [employer] [phone]   Call if you need us  prior to the procedure or if you have any questions at all. Please contact office for sooner follow up if symptoms do not improve or worsen or seek emergency care

## 2023-07-27 NOTE — Progress Notes (Signed)
 History of Present Illness John [name] is a 78 y.o. male current every day smoker with a history of  Stage IIIa (T1b N2 cM0) NSCLC, favoring adenocarcinoma: diagnosed 08/20/2016. Recent PET scan shows concern for recurrence. Referred to  Dr. Baldwin Levee 07/2023 for consideration of bronchoscopy with biopsies.  Prior Oncology Treatment Stage IIIa (T1b N2 cM0) NSCLC, favoring adenocarcinoma: -Chemo XRT with weekly carboplatin  and paclitaxel  completed on 08/20/2016. -Consolidation durvalumab  from 09/26/2016 through 09/25/2017. -Reviewed CT chest on 11/07/2019 which showed increasing size of the upper lobe pulmonary nodules with no new nodule in the left upper lobe suspicious for meta stasis.  Also increasing confluent soft tissue in the right suprahilar region, thought to be postradiation changes. -PET scan on 11/21/2019 showed suspected radiation changes in the right paramediastinal/perihilar region although mild hypermetabolism is present in the right retroareolar region.SUV 2.5.  While progressive soft tissue is present in this region, this hypermetabolism remains nonfocal and favors radiation changes.  7 mm central left upper lobe nodule max SUV 1.4.  9 mm aggregate nodule in the lateral right upper lobe max SUV 2.4.  These are minimally progressive from more remote prior scans.  6 mm left lower lobe nodule unchanged.  No hypermetabolic thoracic adenopathy. -CT chest on 03/23/2020 showed many of the lung nodules are stable.  Couple of nodules have grown by few millimeters.  No new areas seen. - SBRT to the right and left lung lesions, 54 Gray in 3 fractions from 09/10/2020 through 09/17/2020. - SBRT to the RUL and LUL lesions from 06/19/2022 through 06/26/2022 by Dr. Eloise Hake. - CT Chest 07/05/2023 CT Chest showed suspected bilateral upper lobe and right lower lobe disease recurrence. No evidence of distant metastatic disease - PET scan 07/15/2023 showed Bilateral upper lobe and right lower lobe disease recurrence. No  evidence of distant metastatic disease. - 07/2023 Referred to [facility] for consideration of bronchoscopy with biopsies.     07/27/2023 Pt. Presents for initial OV regarding his  suspected recurrent lung cancer. He is continuing to smoke 1/2 PPD.Pt. has a history of Stage IIIa (T1b N2 cM0) NSCLC, favoring adenocarcinoma first diagnosed 05/2016  John [name] did biopsy)  He was diagnosed with adenocarcinoma of the lung in February 2018 following a biopsy. He underwent treatment as noted above, A recent PET scan showed suspected recurrence in the bilateral upper lobes and right lower lobe.  He has a history of COPD, which exacerbates his breathing difficulties, particularly during pollen season. He uses a CPAP machine with oxygen  at night and a home oxygen  concentrator, which he resumed about a week ago due to a COPD flare. He was last seen by a lung doctor in July 2023 and treated for an acute exacerbation of COPD in June 2023 with azithromycin  and prednisone . He uses Trelegy 100 for COPD and has been given a sample of Trelegy 200 to assess symptom control. He also uses ProAir  as needed .  He has experienced significant weight loss, dropping about 20 pounds in the last 3 months.  He started  appetite stimulant started about a week ago has improved his appetite, and he has less nausea since starting the medication. He has a history of diabetes managed with insulin , currently taking 14 units at night. He understands he will need to adjust his insulin  dosage prior to his biopsy.He is also on Plavix  due to stents placed in his legs. He understands he will need to be off his Plavix  x 5 full days prior to the biopsy.  He verbalized understanding of this.   He was counseled to quit smoking x 4 minutes . He is smoking 1/2 PPD. He is currently experiencing a flare with the current pollen.  I will treat with a z pack and prednisone  taper to oprimize him for bronchoscopy with biopsies.  I explained that this  will increase blood sugars. He is aware he will need to monitor this closely.    Test Results:  - PET scan from 07/09/2023: Left upper lobe mass measures 3.5 x 5.1 cm with SUV 10.8.  Additional hypermetabolic nodules in the right lung both within the postradiation consolidation in the right upper lobe SUV 10.2 and within the medial right lower lobe SUV 8.4 measuring 2 x 2.4 cm.  No hypermetabolic adenopathy. Referred to pulmonary for biopsy   CT Chest 06/2023 Interval increased soft tissue density within the treatment bed in the left upper lobe, concerning for local recurrence. Slight interval increase in size of necrotic nodule in the superior segment of the right lower lobe, also suspicious for tumor progression. Consider PET-CT for further evaluation. 2. Stable additional small left lung nodules. Interval resolution of right-sided pleural effusion. 3. No evidence of metastatic adenopathy or acute thoracic findings. 4. Aortic Atherosclerosis (ICD10-I70.0) and Emphysema (ICD10-J43.9).      Latest Ref Rng & Units 06/29/2023    3:21 PM 06/05/2023   12:30 PM 05/31/2023    6:07 AM  CBC  WBC 4.0 - 10.5 K/uL 11.0  8.4  10.0   Hemoglobin 13.0 - 17.0 g/dL 16.1  09.6  04.5   Hematocrit 39.0 - 52.0 % 43.2  39.0  36.7   Platelets 150 - 400 K/uL 331  377  302        Latest Ref Rng & Units 06/29/2023    3:21 PM 06/05/2023   12:30 PM 05/31/2023    6:07 AM  BMP  Glucose 70 - 99 mg/dL 409  811  914   BUN 8 - 23 mg/dL 22  11  12    Creatinine 0.61 - 1.24 mg/dL 7.82  9.56  2.13   BUN/Creat Ratio 10 - 24  15    Sodium 135 - 145 mmol/L 136  140  136   Potassium 3.5 - 5.1 mmol/L 5.0  4.5  3.9   Chloride 98 - 111 mmol/L 98  96  102   CO2 22 - 32 mmol/L 28  27  25    Calcium 8.9 - 10.3 mg/dL 9.5  9.7  9.2     BNP    Component Value Date/Time   BNP 104.3 (H) 02/13/2023 1233   BNP 191.0 (H) 10/10/2021 1705    ProBNP No results found for: "PROBNP"  PFT No results found for: "FEV1PRE",  "FEV1POST", "FVCPRE", "FVCPOST", "TLC", "DLCOUNC", "PREFEV1FVCRT", "PSTFEV1FVCRT"  MR Brain W Wo Contrast Result Date: 07/22/2023 CLINICAL DATA:  Provided history: Non-small cell lung cancer, right. Metastatic disease evaluation. EXAM: MRI HEAD WITHOUT AND WITH CONTRAST TECHNIQUE: Multiplanar, multiecho pulse sequences of the brain and surrounding structures were obtained without and with intravenous contrast. CONTRAST:  10mL GADAVIST  GADOBUTROL  1 MMOL/ML IV SOLN COMPARISON:  Head CT 01/28/2022.  Brain MRI 03/25/2019. FINDINGS: Brain: Moderate generalized cerebral atrophy.  Mild cerebellar atrophy. Multifocal T2 FLAIR hyperintense signal abnormality within the cerebral white matter, nonspecific but compatible with moderate chronic small vessel ischemic disease. Small chronic lacunar infarct within the left thalamus, new from the prior MRI (series 14, image 13). There is no acute infarct. No evidence of an intracranial  mass. No chronic intracranial blood products. No extra-axial fluid collection. No midline shift. No pathologic intracranial enhancement identified. Vascular: Maintained flow voids within the proximal large arterial vessels. Skull and upper cervical spine: No focal worrisome marrow lesion. Incompletely assessed cervical spondylosis. Sinuses/Orbits: No mass or acute finding within the imaged orbits. Prior bilateral ocular lens replacement. No significant paranasal sinus disease. IMPRESSION: 1.  No evidence of an acute intracranial abnormality. 2. Moderate cerebral white matter chronic small vessel ischemic disease, similar to the prior brain MRI of 03/25/2019. 3. Small chronic lacunar infarct within the left thalamus, new from the prior MRI. 4. Moderate generalized cerebral atrophy 5. Mild cerebellar atrophy. Electronically Signed   By: Bascom Lily D.O.   On: 07/22/2023 17:23   NM PET Image Restag (PS) Skull Base To Thigh Result Date: 07/15/2023 CLINICAL DATA:  Subsequent treatment strategy for  non-small cell lung cancer. EXAM: NUCLEAR MEDICINE PET SKULL BASE TO THIGH TECHNIQUE: 7.0 mCi F-18 FDG was injected intravenously. Full-ring PET imaging was performed from the skull base to thigh after the radiotracer. CT data was obtained and used for attenuation correction and anatomic localization. Fasting blood glucose: 135 mg/dl COMPARISON:  CT chest 86/57/8469, 03/24/2023, CT abdomen pelvis 02/17/2023 and PET 05/15/2022. FINDINGS: Mediastinal blood pool activity: SUV max 2.3 Liver activity: SUV max NA NECK: No abnormal hypermetabolism. Incidental CT findings: None. CHEST: Hypermetabolic left upper lobe mass measures 3.5 x 5.1 cm, SUV max 10.8. Additional hypermetabolic nodules in the right lung, both within post radiation consolidation in the right upper lobe, SUV max 10.2, and within the medial right lower lobe, measuring 2.0 x 2.4 cm (203/43), SUV max 8.4. No hypermetabolic adenopathy. Incidental CT findings: Left IJ Port-A-Cath terminates in the SVC. Atherosclerotic calcification of the aorta, aortic valve and coronary arteries. Heart is at the upper limits of normal in size to mildly enlarged. No pericardial effusion. Centrilobular and paraseptal emphysema. 6 mm lateral left lower lobe nodule (203/47), too small for PET resolution. Trace loculated right pleural effusion. Debris in the airway. ABDOMEN/PELVIS: No abnormal hypermetabolism. Incidental CT findings: Liver margin is slightly irregular. Gallstone. Liver, gallbladder, adrenal glands, kidneys, spleen, pancreas, stomach and bowel are otherwise grossly unremarkable. Bladder wall thickening. Prostate appears to be surgically absent. SKELETON: No abnormal hypermetabolism. Incidental CT findings: Degenerative changes in the spine. IMPRESSION: 1. Bilateral upper lobe and right lower lobe disease recurrence. No evidence of distant metastatic disease. 2. 6 mm lateral left lower lobe nodule, too small for PET resolution. Recommend attention on follow-up. 3.  Trace loculated right pleural effusion. 4. Cirrhosis. 5. Cholelithiasis. 6. Aortic atherosclerosis (ICD10-I70.0). Coronary artery calcification. 7.  Emphysema (ICD10-J43.9). Electronically Signed   By: Shearon Denis M.D.   On: 07/15/2023 08:54   CT CHEST W CONTRAST Result Date: 07/05/2023 CLINICAL DATA:  Non-small cell lung cancer, monitor. * Tracking Code: BO * EXAM: CT CHEST WITH CONTRAST TECHNIQUE: Multidetector CT imaging of the chest was performed during intravenous contrast administration. RADIATION DOSE REDUCTION: This exam was performed according to the departmental dose-optimization program which includes automated exposure control, adjustment of the mA and/or kV according to patient size and/or use of iterative reconstruction technique. CONTRAST:  80mL OMNIPAQUE  IOHEXOL  300 MG/ML  SOLN COMPARISON:  Chest CT 03/24/2023 and 12/16/2022.  PET-CT 05/15/2022. FINDINGS: Cardiovascular: No acute vascular findings are identified. There is suboptimal opacification of the pulmonary arteries, without evidence of acute pulmonary embolism. Extensive atherosclerosis of the aorta, great vessels and coronary arteries again noted. Left subclavian Port-A-Cath extends to the  superior cavoatrial junction. There are calcifications of the aortic valve and mitral annulus. The heart size is normal. There is no pericardial effusion. Mediastinum/Nodes: There are no enlarged mediastinal, hilar or axillary lymph nodes. The thyroid  gland, trachea and esophagus demonstrate no significant findings. Lungs/Pleura: Interval resolution of previously demonstrated right-sided pleural effusion. No pneumothorax. Stable treatment changes with architectural distortion in the right perihilar region. Nodule in the superior segment of the right lower lobe appears slightly larger (best seen on the soft tissue windows). This measures 2.4 x 1.4 cm on image 66/2 and demonstrates central necrosis (previously 1.8 x 1.4 cm). Chronic treatment changes  are present within the left upper lobe with interval increased soft tissue density measuring up to 4.2 x 3.5 cm on image 29/3. This is concerning for local recurrence in this region. Unchanged cephalad 0.7 cm left upper lobe nodule on image 19/3 and 7 mm left lower lobe nodule on image 75/3 are unchanged. No other enlarging or new nodules are identified. Upper abdomen: Stable appearance of the visualized upper abdomen, without significant findings. Musculoskeletal/Chest wall: There is no chest wall mass or suspicious osseous finding. Stable mild bilateral gynecomastia. Loop recorder within the left anterior chest wall. IMPRESSION: 1. Interval increased soft tissue density within the treatment bed in the left upper lobe, concerning for local recurrence. Slight interval increase in size of necrotic nodule in the superior segment of the right lower lobe, also suspicious for tumor progression. Consider PET-CT for further evaluation. 2. Stable additional small left lung nodules. Interval resolution of right-sided pleural effusion. 3. No evidence of metastatic adenopathy or acute thoracic findings. 4. Aortic Atherosclerosis (ICD10-I70.0) and Emphysema (ICD10-J43.9). Electronically Signed   By: Elmon Hagedorn M.D.   On: 07/05/2023 10:17   CUP PACEART REMOTE DEVICE CHECK Result Date: 06/29/2023 ILR summary report received. Battery status OK. Normal device function. No new symptom, brady, or pause episodes. No new AF episodes.  6 tachy episodes consistent with atrial driven tachycardia and ST.  2 AT episodes, longest 10 hours, known history. Monthly summary reports and ROV/PRN - CS, CVRS    Past medical hx Past Medical History:  Diagnosis Date   Adenocarcinoma of lung, right (HCC) 04/18/2016   Anemia    Anxiety    Arthritis    Asthma    Atrial flutter (HCC)    BPH (benign prostatic hyperplasia)    with urinary retention 02/06/20   CHF (congestive heart failure) (HCC)    COPD (chronic obstructive pulmonary  disease) (HCC)    Depression    Diabetes mellitus, type II (HCC)    Dyspnea    History of kidney stones    History of radiation therapy    right lung 09/10/2020-09/17/2020   Dr Eloise Hake   History of radiation therapy    Left and right Lung- 06/19/22-06/26/22   Hyperlipidemia    Hypertension    Hypothyroidism    Macular degeneration    Neuropathy    Non-small cell lung cancer, right (HCC) 04/18/2016   Peripheral vascular disease (HCC)    Prostatitis    Pulmonary nodule, left 07/16/2016   Sleep apnea    cpap     Social History   Tobacco Use   Smoking status: Every Day    Current packs/day: 0.50    Average packs/day: 0.5 packs/day for 62.4 years (31.2 ttl pk-yrs)    Types: Cigarettes    Start date: 03/11/1961   Smokeless tobacco: Never   Tobacco comments:    1/2 pack to 1  pack per day-07/27/2023   Vaping Use   Vaping status: Never Used  Substance Use Topics   Alcohol use: No   Drug use: No    Mr.Beckworth reports that he has been smoking cigarettes. He started smoking about 62 years ago. He has a 31.2 pack-year smoking history. He has never used smokeless tobacco. He reports that he does not drink alcohol and does not use drugs.  Tobacco Cessation: Ready to quit: Not Answered Counseling given: Not Answered Tobacco comments: 1/2 pack to 1 pack per day-07/27/2023   Still smoking 1/2 PPD , counseled to quit x 3-4 minutes. Past surgical hx, Family hx, Social hx all reviewed.  Current Outpatient Medications on File Prior to Visit  Medication Sig   albuterol  (VENTOLIN  HFA) 108 (90 Base) MCG/ACT inhaler INHALE 2 PUFFS INTO THE LUNGS EVERY 6 HOURS AS NEEDED FOR WHEEZE OR SHORTNESS OF BREATH   allopurinol  (ZYLOPRIM ) 300 MG tablet TAKE 1 TABLET BY MOUTH EVERY DAY   bisoprolol  (ZEBETA ) 5 MG tablet Take 5 mg by mouth daily.   Blood Glucose Monitoring Suppl (ONETOUCH VERIO REFLECT) w/Device KIT Use to test blood sugars daily as directed. DX: E11.9   clopidogrel  (PLAVIX ) 75 MG tablet  Take 1 tablet (75 mg total) by mouth daily.   Continuous Glucose Receiver (FREESTYLE LIBRE 3 READER) DEVI 1 Device by Does not apply route every 14 (fourteen) days.   Continuous Glucose Sensor (FREESTYLE LIBRE 3 SENSOR) MISC 1 Device by Does not apply route continuous.   diltiazem  (CARDIZEM  CD) 120 MG 24 hr capsule Take by mouth daily as needed.   docusate sodium  (COLACE) 100 MG capsule Take 1 capsule (100 mg total) by mouth daily as needed for mild constipation.   dutasteride  (AVODART ) 0.5 MG capsule Take 1 capsule (0.5 mg total) by mouth daily.   famotidine  (PEPCID ) 20 MG tablet Take 1 tablet (20 mg total) by mouth daily.   ferrous sulfate  325 (65 FE) MG EC tablet Take 1 tablet (325 mg total) by mouth daily.   Fluticasone -Umeclidin-Vilant (TRELEGY ELLIPTA ) 100-62.5-25 MCG/ACT AEPB Inhale 1 puff into the lungs daily.   gabapentin  (NEURONTIN ) 300 MG capsule Take 1-2 capsules (300-600 mg total) by mouth 2 (two) times daily. Take 300mg  (1 cap) in the morning and 600mg  (2 caps) in the evening for a total daily dose of 900mg .   Insulin  Degludec FlexTouch 200 UNIT/ML SOPN Inject 16 units subcutaneus at bedtime as directed by the provider   Insulin  Pen Needle (BD PEN NEEDLE NANO U/F) 32G X 4 MM MISC Use to inject insulin  once daily   levothyroxine  (SYNTHROID ) 50 MCG tablet Take 1 tablet (50 mcg total) by mouth daily.   linaclotide  (LINZESS ) 72 MCG capsule Take 1 capsule (72 mcg total) by mouth daily before breakfast.   megestrol  (MEGACE ) 400 MG/10ML suspension Take 10 mLs (400 mg total) by mouth 2 (two) times daily.   metolazone  (ZAROXOLYN ) 2.5 MG tablet Take 2.5 mg by mouth 3 (three) times a week.   NON FORMULARY CPAP at bedtime   ondansetron  (ZOFRAN ) 4 MG tablet Take 1 tablet (4 mg total) by mouth every 8 (eight) hours as needed for nausea or vomiting.   OneTouch Delica Lancets 30G MISC Use to test blood sugars daily as directed. DX: E11.9   OXYGEN  Inhale 2 L into the lungs daily as needed (with Cpap  at night).   pantoprazole  (PROTONIX ) 40 MG tablet Take 1 tablet (40 mg total) by mouth daily.   pravastatin  (PRAVACHOL ) 40 MG tablet  TAKE 1 TABLET (40 MG TOTAL) BY MOUTH ON MONDAYS , WEDNESDAYS, AND FRIDAYS AS DIRECTED   propranolol  (INDERAL ) 10 MG tablet Take 1 tablet (10 mg total) by mouth 3 (three) times daily as needed.   spironolactone  (ALDACTONE ) 25 MG tablet Take 25 mg by mouth 2 (two) times daily.   torsemide  (DEMADEX ) 20 MG tablet Take 0.5 tablets (10 mg total) by mouth daily.   zolpidem  (AMBIEN ) 5 MG tablet Take 1 tablet (5 mg total) by mouth at bedtime as needed for sleep.   No current facility-administered medications on file prior to visit.     Allergies  Allergen Reactions   Bisoprolol  Other (See Comments)    Confusion, delirium    Sulfa  Antibiotics Swelling    Mouth swelling   Atorvastatin Other (See Comments)    Muscle aches - tolerating Pravastatin  40 mg MWF   Jardiance  [Empagliflozin ] Other (See Comments)    FEELS SLUGGISH, TIRED   Lopressor  [Metoprolol ] Other (See Comments)    Fatigue   Rosuvastatin Other (See Comments)    Muscle aches - tolerating Pravastatin  40 mg MWF   Doxycycline  Nausea Only   Temazepam  Other (See Comments)    Made insomnia worse    Tramadol  Itching    Review Of Systems:  Constitutional:   +  weight loss, No night sweats,  Fevers, chills, fatigue, or  lassitude.  HEENT:   No headaches,  Difficulty swallowing,  Tooth/dental problems, or  Sore throat,                No sneezing, itching, ear ache, nasal congestion, post nasal drip,   CV:  No chest pain,  Orthopnea, PND, swelling in lower extremities, anasarca, dizziness, palpitations, syncope.   GI  No heartburn, indigestion, abdominal pain, nausea, vomiting, diarrhea, change in bowel habits, loss of appetite, bloody stools.   Resp: + shortness of breath with exertion or at rest.  + excess mucus, + productive cough,  No non-productive cough,  No coughing up of blood.  + change in color  of mucus.  + wheezing.  No chest wall deformity  Skin: no rash or lesions.  GU: no dysuria, change in color of urine, no urgency or frequency.  No flank pain, no hematuria   MS:  No joint pain or swelling.  No decreased range of motion.  No back pain.  Psych:  No change in mood or affect. No depression or anxiety.  No memory loss.   Vital Signs BP (!) 155/78 (BP Location: Right Arm, Patient Position: Sitting, Cuff Size: Normal)   Pulse 95   Ht 5\' 6"  (1.676 m)   Wt 155 lb 3.2 oz (70.4 kg)   SpO2 95%   BMI 25.05 kg/m    Physical Exam:  General- No distress,  A&Ox3, pleasant  ENT: No sinus tenderness, TM clear, pale nasal mucosa, no oral exudate,no post nasal drip, no LAN Cardiac: S1, S2, regular rate and rhythm, no murmur Chest: + wheeze/ No rales/ dullness; no accessory muscle use, no nasal flaring, no sternal retractions, diminished per bases, prolonged expiratory phase Abd.: Soft Non-tender, ND, BS +, Body mass index is 25.05 kg/m.  Ext: No clubbing cyanosis, edema, no obvious deformities Neuro: physical deconditioning, in wheelchair,  MAE x 4, A&O x 3, appropriate Skin: No rashes, warm and dry, neck lesion I have asked he follow up with dermatology about.  Psych: normal mood and behavior   Assessment/Plan Lung adenocarcinoma - Suspected recurrence in bilateral lungs upper lobes and right  lower lobe.  - Schedule bronchoscopy to determine if cancer recurrence - Ensure he stops Plavix  5 days before procedure. - Coordinate with Dr. Fulton Job regarding Plavix  washout x 5 days  Chronic Obstructive Pulmonary Disease (COPD) with acute exacerbation  Chronic condition with exacerbations due to pollen. Uses oxygen  as needed , Maintenance is Trelegy 100 -  Trelegy 200 trial for efficacy. - Prescribe Trelegy 200 and provide samples to assess efficacy. - Prescribe prednisone  and azithromycin  for acute exacerbation. - Advise him to monitor blood glucose levels closely while on  prednisone . - Instruct him to wear a mask when exposed to pollen.  Diabetes Mellitus Elevated blood glucose on insulin , particularly on prednisone . Aware of insulin  adjustment for procedures. - Advise him to consult with anesthesiologist regarding insulin  dose adjustment before bronchoscopy.  Peripheral Artery Disease (PAD) Has leg stents, on Plavix .  - Ensure Plavix  is stopped 5 days before bronchoscopy. - Will clear with Dr.Clark vascular surgery prior to bronch  Follow-up Necessary to review biopsy results and manage ongoing conditions. - Schedule follow-up appointment one week after bronchoscopy to discuss results.  Bronchoscopy with biopsies  I have placed an order for a bronchoscopy with biopsies.  We have discussed the procedure in detail.  We have reviewed the risks and benefits of the procedure. These include bleeding, infection, puncture of the lung, and adverse reaction to anesthesia. You have agreed to proceed with biopsy to evaluate the areas of the lung that are positive on PET scan.  Your procedure will be done by Dr. Racheal Buddle. You will receive a letter today with date time and information pertaining to the procedure. You will need someone to drive you to the procedure, stay with you during the procedure, and stay with you after the procedure. You will also need someone to stay with you for 24 hours after anesthesia to ensure you have cleared and are doing well. You will follow-up with me 1 week after the procedure to review the results and to ensure you are doing well. I have sent in a prescription for a z pack, and for a prednisone  taper for your COPD flare. I have send in a prescription for Trelegy 200  Take 1 puff once daily  Rinse mouth after use. We will also give you a sample to make sure it helps you before you fill the prescription. Do not take Trelegy 100 when you are taking the 200 mg dose. Please work on quitting smoking. You can receive free nicotine   replacement therapy (patches, gum, or mints) by calling 1-800-QUIT NOW. Please call so we can get you on the path to becoming a non-smoker. I know it is hard, but you can do this!  Hypnosis for smoking cessation  [employer] [phone]  Acupuncture for smoking cessation  [facility] [phone]   Call if you need us  prior to the procedure or if you have any questions at all. Please contact office for sooner follow up if symptoms do not improve or worsen or seek emergency care      TRASHAWN OQUENDO [address] [facility] [id number]   Dear Mr. Holdren,  The following has been scheduled for you:   Procedure: Bronchoscopy                 Surgeon:  Dr Baldwin Levee     Procedure date:  08/03/23 at 3:15 pm  Arrival Time:  12:45pm Location: [facility] [address] Entrance [place], [place] [address]  Additional Information: Please stop the following medications:  Will need to stop plavix  x 5 days prior to procedure  Do not eat anything after midnight - ok to take medications on the day of procedure with only sips of water  You will need someone to drive you home after this procedure & to be with you for the next 24 hours You may receive a call from the hospital a day or two prior to procedure to go over additional instructions & medications  NEXT VISIT:  08/11/23 @ 2:30pm With Dara Ear, NP at    [facility]    [address] [place], [place]  [address]  I spent 35 minutes dedicated to the care of this patient on the date of this encounter to include pre-visit review of records, face-to-face time with the patient discussing conditions above, post visit ordering of testing, clinical documentation with the electronic health record, making appropriate referrals as documented, and communicating necessary information to the patient's healthcare team.    Raejean Bullock, NP 07/27/2023  12:43 PM

## 2023-07-28 ENCOUNTER — Telehealth: Payer: Self-pay | Admitting: *Deleted

## 2023-07-28 NOTE — Telephone Encounter (Signed)
 He will need to keep us  updated on glucose readings as he is on the Prednisone , sometimes people can spike really high and others may not.  It just depends.  We may need to adjust insulin  to help combat higher readings and that is ok.

## 2023-07-28 NOTE — Telephone Encounter (Signed)
 Patient gave me the two blood glucose readings below, he just started the Prednisone  today. He was wondering what he should do as the prednisone  causes his blood sugars to go get high.

## 2023-07-28 NOTE — Telephone Encounter (Signed)
 Pt called with high BG readings.   Date Before breakfast Before lunch Before supper Bedtime  07/27/23    309  07/28/23 167                   Pt taking:

## 2023-07-28 NOTE — Telephone Encounter (Signed)
 Patient called and left a message that he would need to cancel appointment for 08/03/23. He will be having a lung biopsy.  He also has some questions about his blood sugars, we will address that.

## 2023-07-29 ENCOUNTER — Other Ambulatory Visit: Payer: Self-pay | Admitting: *Deleted

## 2023-07-29 DIAGNOSIS — E1142 Type 2 diabetes mellitus with diabetic polyneuropathy: Secondary | ICD-10-CM

## 2023-07-29 DIAGNOSIS — Z7984 Long term (current) use of oral hypoglycemic drugs: Secondary | ICD-10-CM

## 2023-07-29 DIAGNOSIS — E1165 Type 2 diabetes mellitus with hyperglycemia: Secondary | ICD-10-CM

## 2023-07-29 DIAGNOSIS — Z794 Long term (current) use of insulin: Secondary | ICD-10-CM

## 2023-07-29 NOTE — Telephone Encounter (Signed)
 Patient was called and given Whitney's recommendation.  Patient states that he is going to try and get his meter down here so that she can see his readings. Patient also ask , to be sure , how much Tresiba  he is to be injecting at night. Patient was advised that he was to inject 16 units at night , per the last office note.

## 2023-07-30 ENCOUNTER — Ambulatory Visit (INDEPENDENT_AMBULATORY_CARE_PROVIDER_SITE_OTHER): Payer: Medicare Other | Admitting: Family

## 2023-07-30 ENCOUNTER — Encounter: Payer: Self-pay | Admitting: Family

## 2023-07-30 ENCOUNTER — Other Ambulatory Visit: Payer: Self-pay

## 2023-07-30 ENCOUNTER — Encounter (HOSPITAL_COMMUNITY): Payer: Self-pay | Admitting: Emergency Medicine

## 2023-07-30 VITALS — BP 123/64 | HR 95 | Temp 97.9°F | Ht 66.0 in | Wt 156.4 lb

## 2023-07-30 DIAGNOSIS — M150 Primary generalized (osteo)arthritis: Secondary | ICD-10-CM | POA: Diagnosis not present

## 2023-07-30 DIAGNOSIS — E785 Hyperlipidemia, unspecified: Secondary | ICD-10-CM

## 2023-07-30 DIAGNOSIS — E039 Hypothyroidism, unspecified: Secondary | ICD-10-CM

## 2023-07-30 DIAGNOSIS — E1142 Type 2 diabetes mellitus with diabetic polyneuropathy: Secondary | ICD-10-CM

## 2023-07-30 DIAGNOSIS — E1169 Type 2 diabetes mellitus with other specified complication: Secondary | ICD-10-CM

## 2023-07-30 DIAGNOSIS — E1159 Type 2 diabetes mellitus with other circulatory complications: Secondary | ICD-10-CM | POA: Diagnosis not present

## 2023-07-30 DIAGNOSIS — F5101 Primary insomnia: Secondary | ICD-10-CM

## 2023-07-30 DIAGNOSIS — C3491 Malignant neoplasm of unspecified part of right bronchus or lung: Secondary | ICD-10-CM | POA: Diagnosis not present

## 2023-07-30 DIAGNOSIS — E782 Mixed hyperlipidemia: Secondary | ICD-10-CM | POA: Diagnosis not present

## 2023-07-30 DIAGNOSIS — Z79891 Long term (current) use of opiate analgesic: Secondary | ICD-10-CM | POA: Diagnosis not present

## 2023-07-30 DIAGNOSIS — I5032 Chronic diastolic (congestive) heart failure: Secondary | ICD-10-CM

## 2023-07-30 DIAGNOSIS — K219 Gastro-esophageal reflux disease without esophagitis: Secondary | ICD-10-CM | POA: Diagnosis not present

## 2023-07-30 DIAGNOSIS — J449 Chronic obstructive pulmonary disease, unspecified: Secondary | ICD-10-CM

## 2023-07-30 DIAGNOSIS — J9611 Chronic respiratory failure with hypoxia: Secondary | ICD-10-CM | POA: Diagnosis not present

## 2023-07-30 DIAGNOSIS — F17210 Nicotine dependence, cigarettes, uncomplicated: Secondary | ICD-10-CM

## 2023-07-30 DIAGNOSIS — G4733 Obstructive sleep apnea (adult) (pediatric): Secondary | ICD-10-CM

## 2023-07-30 MED ORDER — ALLOPURINOL 300 MG PO TABS: 300.0000 mg | ORAL_TABLET | Freq: Every day | ORAL | 0 refills | Status: DC

## 2023-07-30 MED ORDER — ZOLPIDEM TARTRATE 5 MG PO TABS: 5.0000 mg | ORAL_TABLET | Freq: Every evening | ORAL | 2 refills | Status: DC | PRN

## 2023-07-30 MED ORDER — PRAVASTATIN SODIUM 40 MG PO TABS: ORAL_TABLET | ORAL | 4 refills | Status: DC

## 2023-07-30 MED ORDER — ALBUTEROL SULFATE HFA 108 (90 BASE) MCG/ACT IN AERS: INHALATION_SPRAY | RESPIRATORY_TRACT | 5 refills | Status: DC

## 2023-07-30 MED ORDER — PANTOPRAZOLE SODIUM 40 MG PO TBEC: 40.0000 mg | DELAYED_RELEASE_TABLET | Freq: Every day | ORAL | 0 refills | Status: DC

## 2023-07-30 NOTE — Progress Notes (Signed)
 PCP - Tommas Fragmin, FNP Cardiologist - Dr Jackquelyn Mass EP - Dr Harvie Liner Endocrinology - Hulon Magic, NP Oncology - Dr Paulett Boros Pulmonology - Dara Ear, NP Urology - Dr Johnie Nailer  CT Chest x-ray - 06/29/23 EKG - 05/29/23 Stress Test - 03/12/18 ECHO - 09/15/21 Cardiac Cath - n/a  Loop Recorder - Last remote check was on 07/04/23, normal.  Sleep Study -  Yes (2019) CPAP - uses nightly  Diabetes Type 2 FreeStyle Libre 3, Sensor on Left Arm THE NIGHT BEFORE SURGERY, take 8 units Tresiba  Insulin .       If your blood sugar is less than 70 mg/dL, you will need to treat for low blood sugar: Treat a low blood sugar (less than 70 mg/dL) with  cup of clear juice (cranberry or apple), 4 glucose tablets, OR glucose gel. Recheck blood sugar in 15 minutes after treatment (to make sure it is greater than 70 mg/dL). If your blood sugar is not greater than 70 mg/dL on recheck, call [phone] for further instructions.  Blood Thinner Instructions:  Hold Plavix  5 days prior to procedure per MD.  Last dose was on Tuesday, 07/28/23.  Aspirin  Instructions: n/a  NPO  Anesthesia review: Yes  STOP now taking any Aspirin  (unless otherwise instructed by your surgeon), Aleve, Naproxen, Ibuprofen , Motrin , Advil , Goody's, BC's, all herbal medications, fish oil, and all vitamins.   Coronavirus Screening Do you have any of the following symptoms:  Cough yes/no: No Fever (>100.31F)  yes/no: No Runny nose yes/no: No Sore throat yes/no: No Difficulty breathing/shortness of breath  Yes, COPD  Have you traveled in the last 14 days and where? yes/no: No  Patient/friend John [name] verbalized understanding of instructions that were given via phone.

## 2023-07-30 NOTE — Anesthesia Preprocedure Evaluation (Addendum)
 Anesthesia Evaluation  Patient identified by MRN, date of birth, ID band Patient awake    Reviewed: Allergy & Precautions, NPO status , Patient's Chart, lab work & pertinent test results, reviewed documented beta blocker date and time   Airway Mallampati: II  TM Distance: >3 FB Neck ROM: Full    Dental  (+) Edentulous Upper, Edentulous Lower   Pulmonary asthma , sleep apnea (oxygen  qhs with CPAP and PRN), Continuous Positive Airway Pressure Ventilation and Oxygen  sleep apnea , COPD (2LPM [place] prn),  COPD inhaler and oxygen  dependent, Current Smoker non-small cell lung CA status post chemoradiation in 2018, advanced COPD with OSA on CPAP with nocturnal O2, current smoker  CT Chest 07/05/2023 showed suspected bilateral upper lobe and right lower lobe disease recurrence. No evidence of distant metastatic disease. PET scan 07/15/2023 showed Bilateral upper lobe and right lower lobe disease recurrence. No evidence of distant metastatic disease. He was referred to [facility] for consideration of bronchoscopy with biopsies.  31 pack year history    Pulmonary exam normal breath sounds clear to auscultation       Cardiovascular hypertension (152/78 preop), Pt. on medications pulmonary hypertension (mild pHTN on echo)+ Peripheral Vascular Disease  Normal cardiovascular exam+ dysrhythmias (s/p ablation) Atrial Fibrillation  Rhythm:Regular Rate:Normal   TTE 09/15/21: 1. Aortic valve calcified with restricted motion but no AS by doppler.   2. Left ventricular ejection fraction, by estimation, is 55 to 60%. The  left ventricle has normal function. The left ventricle has no regional  wall motion abnormalities. Left ventricular diastolic parameters are  indeterminate.   3. Right ventricular systolic function is normal. The right ventricular  size is normal. There is mildly elevated pulmonary artery systolic  pressure.   4. Left atrial size was  mildly dilated.   5. The mitral valve is normal in structure. No evidence of mitral valve  regurgitation. No evidence of mitral stenosis. Moderate mitral annular  calcification.   6. The aortic valve is calcified. Aortic valve regurgitation is trivial.  Aortic valve sclerosis/calcification is present, without any evidence of  aortic stenosis.   7. The inferior vena cava is dilated in size with <50% respiratory  variability, suggesting right atrial pressure of 15 mmHg.    Last seen by EP cardiologist Dr. Marven Slimmer 05/08/23. Noted to be in sinus rhythm with 1st degree AVB. Heart rates improved on propanolol. Diltiazem  discontinued.   Neuro/Psych  PSYCHIATRIC DISORDERS  Depression    negative neurological ROS     GI/Hepatic Neg liver ROS, PUD,GERD  Controlled and Medicated,,  Endo/Other  diabetes, Well Controlled, Type 2, Insulin  DependentHypothyroidism    Renal/GU negative Renal ROS  negative genitourinary   Musculoskeletal  (+) Arthritis , Osteoarthritis,    Abdominal   Peds  Hematology negative hematology ROS (+)   Anesthesia Other Findings   Reproductive/Obstetrics negative OB ROS                             Anesthesia Physical Anesthesia Plan  ASA: 4  Anesthesia Plan: General   Post-op Pain Management: Tylenol  PO (pre-op)*   Induction: Intravenous  PONV Risk Score and Plan: Ondansetron , Dexamethasone  and Treatment may vary due to age or medical condition  Airway Management Planned: Oral ETT  Additional Equipment: None  Intra-op Plan:   Post-operative Plan: Extubation in OR  Informed Consent: I have reviewed the patients History and Physical, chart, labs and discussed the procedure including the risks, benefits  and alternatives for the proposed anesthesia with the patient or authorized representative who has indicated his/her understanding and acceptance.     Dental advisory given  Plan Discussed with: CRNA  Anesthesia Plan  Comments: ( )        Anesthesia Quick Evaluation

## 2023-07-30 NOTE — Patient Instructions (Signed)

## 2023-07-30 NOTE — Progress Notes (Signed)
 Anesthesia Chart Review: Same day workup  78 yo male with pertinent hx of atrial flutter status post ablation on 02/13/2022, ILR in place, tachycardia, hyperlipidemia, HFpEF, PAD, right femoral endarterectomy on 08/20/2020 and right common iliac artery stent on 08/10/2020, IDDM2 (A1c 5.8 on 04/30/23), non-small cell lung CA status post chemoradiation in 2018, advanced COPD with OSA on CPAP with nocturnal O2, current smoker, and history of GIB.   Last seen by EP cardiologist Dr. Marven Slimmer 05/08/23. Noted to be in sinus rhythm with 1st degree AVB. Heart rates improved on propanolol. Diltiazem  discontinued. 6 month followup recommended.   CT Chest 07/05/2023 showed suspected bilateral upper lobe and right lower lobe disease recurrence. No evidence of distant metastatic disease. PET scan 07/15/2023 showed Bilateral upper lobe and right lower lobe disease recurrence. No evidence of distant metastatic disease. He was referred to [facility] for consideration of bronchoscopy with biopsies.  When see in pulmonology followup 07/27/23 he was noted to be having COPD flare felt due in part to pollen. He was treated with zpak and prednisone  taper to optimize for bronchoscopy. He was also advised he would need to hold Plavix  5d prior to procedure.   CMP and CBC 06/29/23 reviewed, unremarkable.   Pt will need DOS evaluation.   CT Chest 06/2023 Interval increased soft tissue density within the treatment bed in the left upper lobe, concerning for local recurrence. Slight interval increase in size of necrotic nodule in the superior segment of the right lower lobe, also suspicious for tumor progression. Consider PET-CT for further evaluation. 2. Stable additional small left lung nodules. Interval resolution of right-sided pleural effusion. 3. No evidence of metastatic adenopathy or acute thoracic findings. 4. Aortic Atherosclerosis (ICD10-I70.0) and Emphysema (ICD10-J43.9).  TTE 09/15/21: 1. Aortic valve calcified  with restricted motion but no AS by doppler.   2. Left ventricular ejection fraction, by estimation, is 55 to 60%. The  left ventricle has normal function. The left ventricle has no regional  wall motion abnormalities. Left ventricular diastolic parameters are  indeterminate.   3. Right ventricular systolic function is normal. The right ventricular  size is normal. There is mildly elevated pulmonary artery systolic  pressure.   4. Left atrial size was mildly dilated.   5. The mitral valve is normal in structure. No evidence of mitral valve  regurgitation. No evidence of mitral stenosis. Moderate mitral annular  calcification.   6. The aortic valve is calcified. Aortic valve regurgitation is trivial.  Aortic valve sclerosis/calcification is present, without any evidence of  aortic stenosis.   7. The inferior vena cava is dilated in size with <50% respiratory  variability, suggesting right atrial pressure of 15 mmHg.    Edilia Gordon [facility]/Anesthesiology Phone [phone] 07/30/2023 10:55 AM

## 2023-07-30 NOTE — Progress Notes (Addendum)
 Subjective:    Patient ID: John [name], male    DOB: [birth year], 78 y.o.   MRN: [id number]  Chief Complaint  Patient presents with   Medical Management of Chronic Issues   Pt presents to the office today for chronic care follow up.   He is followed by Cardiologists  for CHF.    Followed by Oncologists every 3 months for non-small lung cancer. He completed radiation. Has a lung biopsy 08/03/23.    He is followed by Pulmonologist every 6 months for COPD. He continues to smoke 1/2 and complaining of increased SOB.  He is followed by Vascular for PAD.    He is followed by Urologists for BPH and Prostate.   He has seen GI for bleed, but stable now.     Has OSA and using CPAP nightly.   Hypertension This is a chronic problem. The current episode started more than 1 year ago. The problem has been waxing and waning since onset. The problem is controlled. Associated symptoms include malaise/fatigue and shortness of breath. Pertinent negatives include no peripheral edema. Risk factors for coronary artery disease include dyslipidemia, obesity, male gender and sedentary lifestyle. The current treatment provides moderate improvement. Identifiable causes of hypertension include a thyroid  problem.  Congestive Heart Failure Presents for follow-up visit. Associated symptoms include fatigue, nocturia (1) and shortness of breath. Pertinent negatives include no edema. The symptoms have been stable.  Gastroesophageal Reflux He complains of belching. He reports no globus sensation, no heartburn or no nausea. This is a chronic problem. The current episode started more than 1 year ago. The problem occurs occasionally. Associated symptoms include fatigue. He has tried a PPI and an herbal remedy for the symptoms. The treatment provided moderate relief.  Hyperlipidemia This is a chronic problem. The current episode started more than 1 year ago. Exacerbating diseases include obesity. Associated symptoms  include shortness of breath. Current antihyperlipidemic treatment includes statins and diet change. The current treatment provides moderate improvement of lipids. Risk factors for coronary artery disease include dyslipidemia, hypertension and a sedentary lifestyle.  Thyroid  Problem Presents for follow-up visit. Symptoms include anxiety, constipation (improved) and fatigue. The symptoms have been stable.  Arthritis Presents for follow-up visit. He complains of pain. Affected locations include the left hip, left knee, right knee, right hip, left MCP and right MCP. His pain is at a severity of 4/10. Associated symptoms include fatigue.  Benign Prostatic Hypertrophy This is a chronic problem. The current episode started more than 1 year ago. Irritative symptoms include nocturia (1). Pertinent negatives include no nausea.  Insomnia Primary symptoms: sleep disturbance, difficulty falling asleep, frequent awakening, malaise/fatigue.   The current episode started more than one year. The problem occurs intermittently. Past treatments include medication. The treatment provided moderate relief.  Constipation This is a chronic problem. The current episode started more than 1 year ago. His stool frequency is 2 to 3 times per week (with linzess ). Pertinent negatives include no nausea. He has tried laxatives for the symptoms. The treatment provided moderate relief.  Diabetes He presents for his follow-up diabetic visit. He has type 2 diabetes mellitus. Hypoglycemia symptoms include nervousness/anxiousness. Associated symptoms include fatigue and foot paresthesias. Diabetic complications include peripheral neuropathy. Risk factors for coronary artery disease include diabetes mellitus, dyslipidemia, male sex, hypertension and sedentary lifestyle. He is following a generally healthy diet. His overall blood glucose range is >200 mg/dl.      Review of Systems  Constitutional:  Positive for fatigue and  malaise/fatigue.  Respiratory:  Positive for shortness of breath.   Gastrointestinal:  Positive for constipation (improved). Negative for heartburn and nausea.  Genitourinary:  Positive for nocturia (1).  Psychiatric/Behavioral:  Positive for sleep disturbance. The patient is nervous/anxious.   All other systems reviewed and are negative.  Family History  Problem Relation Age of Onset   Hypertension Mother    Diabetes Father    Heart disease Father    Stroke Father    Hypertension Sister    Sleep apnea Neg Hx    Social History   Socioeconomic History   Marital status: Significant Other    Spouse name: Not on file   Number of children: 5   Years of education: 10   Highest education level: 10th grade  Occupational History   Occupation: Retired  Tobacco Use   Smoking status: Every Day    Current packs/day: 0.50    Average packs/day: 0.5 packs/day for 62.4 years (31.2 ttl pk-yrs)    Types: Cigarettes    Start date: 03/11/1961   Smokeless tobacco: Never   Tobacco comments:    1/2 pack to 1 pack per day-07/27/2023   Vaping Use   Vaping status: Never Used  Substance and Sexual Activity   Alcohol use: No   Drug use: No   Sexual activity: Yes    Birth control/protection: None  Other Topics Concern   Not on file  Social History Narrative   [name] is his POA and lives with him. Children out of town - one in [facility], others in [place].   Social Drivers of Corporate investment banker Strain: Low Risk  (05/30/2023)   Overall Financial Resource Strain (CARDIA)    Difficulty of Paying Living Expenses: Not very hard  Food Insecurity: No Food Insecurity (05/30/2023)   Hunger Vital Sign    Worried About Running Out of Food in the Last Year: Never true    Ran Out of Food in the Last Year: Never true  Transportation Needs: No Transportation Needs (05/30/2023)   PRAPARE - Administrator, Civil Service (Medical): No    Lack of Transportation (Non-Medical): No  Physical  Activity: Insufficiently Active (05/30/2023)   Exercise Vital Sign    Days of Exercise per Week: 3 days    Minutes of Exercise per Session: 10 min  Stress: No Stress Concern Present (05/30/2023)   [employer] of Occupational Health - Occupational Stress Questionnaire    Feeling of Stress : Not at all  Social Connections: Moderately Isolated (05/30/2023)   Social Connection and Isolation Panel [NHANES]    Frequency of Communication with Friends and Family: Twice a week    Frequency of Social Gatherings with Friends and Family: Twice a week    Attends Religious Services: Never    Database administrator or Organizations: No    Attends Banker Meetings: Never    Marital Status: Married        Objective:   Physical Exam Vitals reviewed.  Constitutional:      General: He is not in acute distress.    Appearance: He is well-developed. He is obese.  HENT:     Head: Normocephalic.     Right Ear: Tympanic membrane normal.     Left Ear: Tympanic membrane normal.  Eyes:     General:        Right eye: No discharge.        Left eye: No discharge.  Pupils: Pupils are equal, round, and reactive to light.  Neck:     Thyroid : No thyromegaly.  Cardiovascular:     Rate and Rhythm: Normal rate and regular rhythm.     Heart sounds: Murmur heard.  Pulmonary:     Effort: Pulmonary effort is normal. No respiratory distress.     Breath sounds: Normal breath sounds. No wheezing.  Abdominal:     General: Bowel sounds are normal. There is no distension.     Palpations: Abdomen is soft.     Tenderness: There is no abdominal tenderness.  Musculoskeletal:        General: Tenderness (pain in right knee with flexion) present. Normal range of motion.     Cervical back: Normal range of motion and neck supple.  Skin:    General: Skin is warm and dry.     Findings: No erythema or rash.  Neurological:     Mental Status: He is alert and oriented to person, place, and time.     Cranial  Nerves: No cranial nerve deficit.     Motor: Weakness present.     Gait: Gait abnormal (using cane).     Deep Tendon Reflexes: Reflexes are normal and symmetric.  Psychiatric:        Behavior: Behavior normal.        Thought Content: Thought content normal.        Judgment: Judgment normal.     BP 123/64   Pulse 95   Temp 97.9 F (36.6 C) (Temporal)   Ht 5\' 6"  (1.676 m)   Wt 156 lb 6.4 oz (70.9 kg)   SpO2 91%   BMI 25.24 kg/m      Assessment & Plan:  JAYLENN ALTIER comes in today with chief complaint of Medical Management of Chronic Issues   Diagnosis and orders addressed:  1. Chronic obstructive pulmonary disease, unspecified COPD type (HCC) - albuterol  (VENTOLIN  HFA) 108 (90 Base) MCG/ACT inhaler; INHALE 2 PUFFS INTO THE LUNGS EVERY 6 HOURS AS NEEDED FOR WHEEZE OR SHORTNESS OF BREATH  Dispense: 8.5 each; Refill: 5  2. GERD without esophagitis - pantoprazole  (PROTONIX ) 40 MG tablet; Take 1 tablet (40 mg total) by mouth daily.  Dispense: 90 tablet; Refill: 0  3. Hyperlipidemia associated with type 2 diabetes mellitus (HCC) - pravastatin  (PRAVACHOL ) 40 MG tablet; TAKE 1 TABLET (40 MG TOTAL) BY MOUTH ON MONDAYS , WEDNESDAYS, AND FRIDAYS AS DIRECTED  Dispense: 48 tablet; Refill: 4  4. Primary insomnia - zolpidem  (AMBIEN ) 5 MG tablet; Take 1 tablet (5 mg total) by mouth at bedtime as needed for sleep.  Dispense: 30 tablet; Refill: 2 - ToxASSURE Select 13 (MW), Urine  5. Hypertension associated with diabetes (HCC) (Primary)  6. Non-small cell lung cancer, right (HCC)  7. Chronic diastolic CHF (congestive heart failure) (HCC)  8. Type 2 diabetes mellitus with diabetic polyneuropathy, without long-term current use of insulin  (HCC)  9. Hypothyroidism, unspecified type  10. Mixed diabetic hyperlipidemia associated with type 2 diabetes mellitus (HCC)  11. Nicotine  dependence, cigarettes, uncomplicated  12. Primary osteoarthritis involving multiple joints  13.  Obstructive sleep apnea treated with continuous positive airway pressure (CPAP)  14. Chronic respiratory failure with hypoxia (HCC)    Labs reviewed from specialists  Continue medications  Keep follow up with specialists  Health Maintenance reviewed Diet and exercise encouraged  Follow up plan: 3 months    Tommas Fragmin, FNP

## 2023-07-30 NOTE — Telephone Encounter (Signed)
 Pt brought his CGM device to the office, data was uploaded and evaluated by Whitney Reardon,FNP. Advised pt to increase his Tresiba  to 26 units at bedtime. Pt voiced understanding.

## 2023-08-03 ENCOUNTER — Encounter (HOSPITAL_COMMUNITY): Payer: Self-pay | Admitting: Emergency Medicine

## 2023-08-03 ENCOUNTER — Encounter (HOSPITAL_COMMUNITY)
Admission: RE | Disposition: A | Discharge status: Home / Self Care | Payer: Self-pay | Source: Home / Self Care | Attending: Emergency Medicine

## 2023-08-03 ENCOUNTER — Encounter: Payer: Self-pay | Admitting: Cardiology

## 2023-08-03 ENCOUNTER — Ambulatory Visit (HOSPITAL_COMMUNITY)

## 2023-08-03 ENCOUNTER — Ambulatory Visit (HOSPITAL_COMMUNITY): Payer: Self-pay | Admitting: Physician Assistant

## 2023-08-03 ENCOUNTER — Ambulatory Visit: Payer: Medicare Other | Admitting: Nurse Practitioner

## 2023-08-03 ENCOUNTER — Ambulatory Visit (HOSPITAL_BASED_OUTPATIENT_CLINIC_OR_DEPARTMENT_OTHER): Payer: Self-pay | Admitting: Physician Assistant

## 2023-08-03 ENCOUNTER — Ambulatory Visit: Payer: Medicare Other

## 2023-08-03 ENCOUNTER — Ambulatory Visit (HOSPITAL_COMMUNITY)
Admission: RE | Admit: 2023-08-03 | Discharge: 2023-08-03 | Disposition: A | Discharge status: Home / Self Care | Attending: Emergency Medicine | Admitting: Emergency Medicine

## 2023-08-03 ENCOUNTER — Other Ambulatory Visit: Payer: Self-pay

## 2023-08-03 DIAGNOSIS — J432 Centrilobular emphysema: Secondary | ICD-10-CM | POA: Insufficient documentation

## 2023-08-03 DIAGNOSIS — Z8673 Personal history of transient ischemic attack (TIA), and cerebral infarction without residual deficits: Secondary | ICD-10-CM | POA: Insufficient documentation

## 2023-08-03 DIAGNOSIS — I4891 Unspecified atrial fibrillation: Secondary | ICD-10-CM

## 2023-08-03 DIAGNOSIS — R918 Other nonspecific abnormal finding of lung field: Secondary | ICD-10-CM | POA: Diagnosis not present

## 2023-08-03 DIAGNOSIS — E1165 Type 2 diabetes mellitus with hyperglycemia: Secondary | ICD-10-CM

## 2023-08-03 DIAGNOSIS — J449 Chronic obstructive pulmonary disease, unspecified: Secondary | ICD-10-CM | POA: Diagnosis not present

## 2023-08-03 DIAGNOSIS — Z7984 Long term (current) use of oral hypoglycemic drugs: Secondary | ICD-10-CM

## 2023-08-03 DIAGNOSIS — Z923 Personal history of irradiation: Secondary | ICD-10-CM | POA: Insufficient documentation

## 2023-08-03 DIAGNOSIS — Z794 Long term (current) use of insulin: Secondary | ICD-10-CM | POA: Diagnosis not present

## 2023-08-03 DIAGNOSIS — I10 Essential (primary) hypertension: Secondary | ICD-10-CM

## 2023-08-03 DIAGNOSIS — Z85118 Personal history of other malignant neoplasm of bronchus and lung: Secondary | ICD-10-CM | POA: Insufficient documentation

## 2023-08-03 DIAGNOSIS — N400 Enlarged prostate without lower urinary tract symptoms: Secondary | ICD-10-CM | POA: Insufficient documentation

## 2023-08-03 DIAGNOSIS — K8020 Calculus of gallbladder without cholecystitis without obstruction: Secondary | ICD-10-CM | POA: Insufficient documentation

## 2023-08-03 DIAGNOSIS — Z79890 Hormone replacement therapy (postmenopausal): Secondary | ICD-10-CM | POA: Insufficient documentation

## 2023-08-03 DIAGNOSIS — Z6824 Body mass index (BMI) 24.0-24.9, adult: Secondary | ICD-10-CM | POA: Insufficient documentation

## 2023-08-03 DIAGNOSIS — Z79899 Other long term (current) drug therapy: Secondary | ICD-10-CM | POA: Diagnosis not present

## 2023-08-03 DIAGNOSIS — Z7951 Long term (current) use of inhaled steroids: Secondary | ICD-10-CM | POA: Insufficient documentation

## 2023-08-03 DIAGNOSIS — I509 Heart failure, unspecified: Secondary | ICD-10-CM | POA: Insufficient documentation

## 2023-08-03 DIAGNOSIS — I110 Hypertensive heart disease with heart failure: Secondary | ICD-10-CM | POA: Diagnosis not present

## 2023-08-03 DIAGNOSIS — F17210 Nicotine dependence, cigarettes, uncomplicated: Secondary | ICD-10-CM | POA: Insufficient documentation

## 2023-08-03 DIAGNOSIS — G4730 Sleep apnea, unspecified: Secondary | ICD-10-CM | POA: Insufficient documentation

## 2023-08-03 DIAGNOSIS — I3481 Nonrheumatic mitral (valve) annulus calcification: Secondary | ICD-10-CM | POA: Insufficient documentation

## 2023-08-03 DIAGNOSIS — E1142 Type 2 diabetes mellitus with diabetic polyneuropathy: Secondary | ICD-10-CM

## 2023-08-03 DIAGNOSIS — E1151 Type 2 diabetes mellitus with diabetic peripheral angiopathy without gangrene: Secondary | ICD-10-CM | POA: Insufficient documentation

## 2023-08-03 DIAGNOSIS — Z452 Encounter for adjustment and management of vascular access device: Secondary | ICD-10-CM | POA: Diagnosis not present

## 2023-08-03 DIAGNOSIS — I700 Atherosclerosis of aorta: Secondary | ICD-10-CM | POA: Insufficient documentation

## 2023-08-03 DIAGNOSIS — R634 Abnormal weight loss: Secondary | ICD-10-CM | POA: Diagnosis not present

## 2023-08-03 DIAGNOSIS — C3412 Malignant neoplasm of upper lobe, left bronchus or lung: Secondary | ICD-10-CM | POA: Insufficient documentation

## 2023-08-03 DIAGNOSIS — E039 Hypothyroidism, unspecified: Secondary | ICD-10-CM | POA: Diagnosis not present

## 2023-08-03 DIAGNOSIS — Z48813 Encounter for surgical aftercare following surgery on the respiratory system: Secondary | ICD-10-CM | POA: Diagnosis not present

## 2023-08-03 DIAGNOSIS — C3431 Malignant neoplasm of lower lobe, right bronchus or lung: Secondary | ICD-10-CM | POA: Diagnosis not present

## 2023-08-03 DIAGNOSIS — Z87442 Personal history of urinary calculi: Secondary | ICD-10-CM | POA: Diagnosis not present

## 2023-08-03 DIAGNOSIS — I6782 Cerebral ischemia: Secondary | ICD-10-CM | POA: Insufficient documentation

## 2023-08-03 DIAGNOSIS — E785 Hyperlipidemia, unspecified: Secondary | ICD-10-CM | POA: Insufficient documentation

## 2023-08-03 DIAGNOSIS — K7460 Unspecified cirrhosis of liver: Secondary | ICD-10-CM | POA: Insufficient documentation

## 2023-08-03 DIAGNOSIS — R846 Abnormal cytological findings in specimens from respiratory organs and thorax: Secondary | ICD-10-CM | POA: Diagnosis not present

## 2023-08-03 DIAGNOSIS — Z7902 Long term (current) use of antithrombotics/antiplatelets: Secondary | ICD-10-CM | POA: Diagnosis not present

## 2023-08-03 DIAGNOSIS — I5033 Acute on chronic diastolic (congestive) heart failure: Secondary | ICD-10-CM | POA: Diagnosis not present

## 2023-08-03 DIAGNOSIS — I483 Typical atrial flutter: Secondary | ICD-10-CM | POA: Diagnosis not present

## 2023-08-03 HISTORY — DX: Personal history of other medical treatment: Z92.89

## 2023-08-03 HISTORY — DX: Gastro-esophageal reflux disease without esophagitis: K21.9

## 2023-08-03 HISTORY — PX: VIDEO BRONCHOSCOPY WITH ENDOBRONCHIAL NAVIGATION: SHX6175

## 2023-08-03 HISTORY — PX: BRONCHIAL BRUSHINGS: SHX5108

## 2023-08-03 HISTORY — PX: BRONCHIAL NEEDLE ASPIRATION BIOPSY: SHX5106

## 2023-08-03 HISTORY — PX: BRONCHIAL BIOPSY: SHX5109

## 2023-08-03 HISTORY — DX: Iron deficiency anemia, unspecified: D50.9

## 2023-08-03 HISTORY — DX: Dependence on supplemental oxygen: Z99.81

## 2023-08-03 LAB — CUP PACEART REMOTE DEVICE CHECK
Date Time Interrogation Session: 20250428023200
Pulse Gen Serial Number: 104324

## 2023-08-03 LAB — TOXASSURE SELECT 13 (MW), URINE

## 2023-08-03 LAB — GLUCOSE, CAPILLARY: Glucose-Capillary: 157 mg/dL — ABNORMAL HIGH (ref 70–99)

## 2023-08-03 SURGERY — VIDEO BRONCHOSCOPY WITH ENDOBRONCHIAL NAVIGATION
Anesthesia: General | Laterality: Bilateral

## 2023-08-03 MED ORDER — PROPOFOL 500 MG/50ML IV EMUL
INTRAVENOUS | Status: DC | PRN
  Administered 2023-08-03: 125 ug/kg/min via INTRAVENOUS

## 2023-08-03 MED ORDER — INSULIN ASPART 100 UNIT/ML IJ SOLN: 0.0000 [IU] | INTRAMUSCULAR | Status: DC | PRN

## 2023-08-03 MED ORDER — ONDANSETRON HCL 4 MG/2ML IJ SOLN
INTRAMUSCULAR | Status: DC | PRN
  Administered 2023-08-03: 4 mg via INTRAVENOUS

## 2023-08-03 MED ORDER — LIDOCAINE 2% (20 MG/ML) 5 ML SYRINGE
INTRAMUSCULAR | Status: DC | PRN
Start: 2023-08-03 — End: 2023-08-03
  Administered 2023-08-03: 60 mg via INTRAVENOUS

## 2023-08-03 MED ORDER — PHENYLEPHRINE 80 MCG/ML (10ML) SYRINGE FOR IV PUSH (FOR BLOOD PRESSURE SUPPORT)
PREFILLED_SYRINGE | INTRAVENOUS | Status: DC | PRN
  Administered 2023-08-03: 80 ug via INTRAVENOUS

## 2023-08-03 MED ORDER — PROPOFOL 10 MG/ML IV BOLUS
INTRAVENOUS | Status: DC | PRN
  Administered 2023-08-03: 150 mg via INTRAVENOUS

## 2023-08-03 MED ORDER — LACTATED RINGERS IV SOLN: INTRAVENOUS | Status: DC

## 2023-08-03 MED ORDER — DEXAMETHASONE SODIUM PHOSPHATE 10 MG/ML IJ SOLN
INTRAMUSCULAR | Status: DC | PRN
  Administered 2023-08-03: 5 mg via INTRAVENOUS

## 2023-08-03 MED ORDER — INSULIN DEGLUDEC FLEXTOUCH 200 UNIT/ML ~~LOC~~ SOPN: PEN_INJECTOR | SUBCUTANEOUS | Status: DC

## 2023-08-03 MED ORDER — CHLORHEXIDINE GLUCONATE 0.12 % MT SOLN: 15.0000 mL | Freq: Once | OROMUCOSAL | Status: AC

## 2023-08-03 MED ORDER — ROCURONIUM BROMIDE 10 MG/ML (PF) SYRINGE
PREFILLED_SYRINGE | INTRAVENOUS | Status: DC | PRN
  Administered 2023-08-03: 80 mg via INTRAVENOUS

## 2023-08-03 MED ORDER — CHLORHEXIDINE GLUCONATE 0.12 % MT SOLN
OROMUCOSAL | Status: AC
  Administered 2023-08-03: 15 mL via OROMUCOSAL
  Filled 2023-08-03: qty 15

## 2023-08-03 MED ORDER — SUGAMMADEX SODIUM 200 MG/2ML IV SOLN
INTRAVENOUS | Status: DC | PRN
  Administered 2023-08-03: 200 mg via INTRAVENOUS

## 2023-08-03 NOTE — Discharge Instructions (Signed)
 Flexible Bronchoscopy, Care After This sheet gives you information about how to care for yourself after your test. Your doctor may also give you more specific instructions. If you have problems or questions, contact your doctor. Follow these instructions at home: Eating and drinking When you are wide awake, your numbness is gone and your cough and gag reflexes have come back, you may: Start eating only soft foods. Slowly drink liquids. Six hours after the test, go back to your normal diet. Driving Do not drive for 24 hours if you were given a medicine to help you relax (sedative). Do not drive or use heavy machinery while taking prescription pain medicine. General instructions Take over-the-counter and prescription medicines only as told by your doctor. Return to your normal activities as told. Ask what activities are safe for you. Do not use any products that have nicotine  or tobacco in them. This includes cigarettes and e-cigarettes. If you need help quitting, ask your doctor. Keep all follow-up visits as told by your doctor. This is important. It is very important if you had a tissue sample (biopsy) taken. Get help right away if: You have shortness of breath that gets worse. You get light-headed. You feel like you are going to pass out (faint). You have chest pain. You cough up: More than a little blood. More blood than before. Summary Do not use cigarettes. Do not use e-cigarettes. Seek care in the Emergency Department right away if you have chest pain or shortness of breath. Call or MyChart Message our office for any questions or problems at [phone].  Okay to restart your Plavix  on 08/04/2023.   This information is not intended to replace advice given to you by your health care provider. Make sure you discuss any questions you have with your health care provider.

## 2023-08-03 NOTE — Anesthesia Procedure Notes (Signed)
 Procedure Name: Intubation Date/Time: 08/03/2023 2:23 PM  Performed by: Hershall Lory, CRNAPre-anesthesia Checklist: Patient identified, Emergency Drugs available, Suction available and Patient being monitored Patient Re-evaluated:Patient Re-evaluated prior to induction Oxygen  Delivery Method: Circle system utilized Preoxygenation: Pre-oxygenation with 100% oxygen  Induction Type: IV induction Ventilation: Mask ventilation without difficulty Laryngoscope Size: Glidescope Grade View: Grade II Tube type: Oral Tube size: 8.5 mm Number of attempts: 1 Airway Equipment and Method: Stylet and Oral airway Placement Confirmation: ETT inserted through vocal cords under direct vision, positive ETCO2 and breath sounds checked- equal and bilateral Tube secured with: Tape Dental Injury: Teeth and Oropharynx as per pre-operative assessment

## 2023-08-03 NOTE — Op Note (Signed)
 Video Bronchoscopy with Robotic Assisted Bronchoscopic Navigation   Date of Operation: 08/03/2023   Pre-op Diagnosis: Left upper lobe mass, right lower lobe and upper lobe nodules  Post-op Diagnosis: Same  Surgeon: Racheal Buddle  Assistants: None  Anesthesia: General endotracheal anesthesia  Operation: Flexible video fiberoptic bronchoscopy with robotic assistance and biopsies.  Estimated Blood Loss: Minimal  Complications: None  Indications and History: John [name] is a 78 y.o. male with history of non-small cell lung cancer.  Surveillance imaging shows a new left upper lobe mass, new superior segment right lower lobe paraspinal lesion and an area of hypermetabolism in the previously treated right upper lobe opacity.  Recommendation made to achieve a tissue diagnosis via robotic assisted navigational bronchoscopy.  The risks, benefits, complications, treatment options and expected outcomes were discussed with the patient.  The possibilities of pneumothorax, pneumonia, reaction to medication, pulmonary aspiration, perforation of a viscus, bleeding, failure to diagnose a condition and creating a complication requiring transfusion or operation were discussed with the patient who freely signed the consent.    Description of Procedure: The patient was seen in the Preoperative Area, was examined and was deemed appropriate to proceed.  The patient was taken to [facility] Endoscopy room 3, identified as DAO STRITE and the procedure verified as Flexible Video Fiberoptic Bronchoscopy.  A Time Out was held and the above information confirmed.   Prior to the date of the procedure a high-resolution CT scan of the chest was performed. Utilizing ION software program a virtual tracheobronchial tree was generated to allow the creation of distinct navigation pathways to the patient's parenchymal abnormalities. After being taken to the operating room general anesthesia was initiated and the patient  was  orally intubated. The video fiberoptic bronchoscope was introduced via the endotracheal tube and a general inspection was performed which showed normal left lung anatomy.  The right upper lobe and right lower lobe airways were narrowed, tortuous and difficult to pass through due to prior treatment.  An area of right upper lobe endobronchial irregularity was brushed for cytology. Aspiration of the bilateral mainstems was completed to remove any remaining secretions. Robotic catheter inserted into patient's endotracheal tube.   Target #1 left upper lobe mass: The distinct navigation pathways prepared prior to this procedure were then utilized to navigate to patient's lesion identified on CT scan. The robotic catheter was secured into place and the vision probe was withdrawn.  Lesion location was approximated using fluoroscopy.  Local registration and targeting was performed using [employer] Cios mobile C-arm three-dimensional imaging. Under fluoroscopic guidance transbronchial needle brushings, transbronchial needle biopsies, and transbronchial forceps biopsies were performed to be sent for cytology and pathology.  Needle-in-lesion was confirmed using Cios mobile C-arm.    Target #2 paraspinal superior segment right lower lobe nodule: The distinct navigation pathways prepared prior to this procedure were then utilized to navigate to patient's lesion identified on CT scan. The robotic catheter was secured into place and the vision probe was withdrawn.  Lesion location was approximated using fluoroscopy.  Local registration and targeting was performed using [employer] Cios mobile C-arm three-dimensional imaging. Under fluoroscopic guidance transbronchial needle brushings, transbronchial needle biopsies, and transbronchial forceps biopsies were performed to be sent for cytology and pathology.  Needle-in-lesion was confirmed using Cios mobile C-arm.    At the end of the procedure a general  airway inspection was performed and there was no evidence of active bleeding. The bronchoscope was removed.  The patient tolerated the procedure well.  There was no significant blood loss and there were no obvious complications. A post-procedural chest x-ray is pending.  Samples Target #1: 1. Transbronchial needle brushings from left upper lobe mass 2. Transbronchial Wang needle biopsies from left upper lobe mass 3. Transbronchial forceps biopsies from left upper lobe mass  Samples Target #2: 1. Transbronchial needle brushings from right lower lobe superior segment nodule 2. Transbronchial Wang needle biopsies from right lower lobe superior segment nodule 3. Transbronchial forceps biopsies from right lower lobe superior segment nodule  Plans:  The patient will be discharged from the PACU to home when recovered from anesthesia and after chest x-ray is reviewed. We will review the cytology, pathology and microbiology results with the patient when they become available. Outpatient followup will be with Braden Caddy, NP and Dr. Cheree Cords.    Racheal Buddle, MD, PhD 08/03/2023, 3:53 PM [facility] [phone] or if no answer before 7:00PM call [phone] For any issues after 7:00PM please call eLink [phone]

## 2023-08-03 NOTE — Anesthesia Postprocedure Evaluation (Signed)
 Anesthesia Post Note  Patient: John [name]  Procedure(s) Performed: VIDEO BRONCHOSCOPY WITH ENDOBRONCHIAL NAVIGATION (Bilateral) BRONCHOSCOPY, WITH BRUSH BIOPSY BRONCHOSCOPY, WITH NEEDLE ASPIRATION BIOPSY BRONCHOSCOPY, WITH BIOPSY     Patient location during evaluation: PACU Anesthesia Type: General Level of consciousness: awake and alert Pain management: pain level controlled Vital Signs Assessment: post-procedure vital signs reviewed and stable Respiratory status: spontaneous breathing, nonlabored ventilation, respiratory function stable and patient connected to nasal cannula oxygen  Cardiovascular status: blood pressure returned to baseline and stable Postop Assessment: no apparent nausea or vomiting Anesthetic complications: no  No notable events documented.  Last Vitals:  Vitals:   08/03/23 1630 08/03/23 1640  BP: 117/69 130/64  Pulse: 88 88  Resp: (!) 21 (!) 21  Temp:    SpO2: 92% 90%    Last Pain:  Vitals:   08/03/23 1640  TempSrc:   PainSc: 0-No pain                 Rosalita Combe

## 2023-08-03 NOTE — Transfer of Care (Signed)
 Immediate Anesthesia Transfer of Care Note  Patient: John [name]  Procedure(s) Performed: VIDEO BRONCHOSCOPY WITH ENDOBRONCHIAL NAVIGATION (Bilateral) BRONCHOSCOPY, WITH BRUSH BIOPSY BRONCHOSCOPY, WITH NEEDLE ASPIRATION BIOPSY BRONCHOSCOPY, WITH BIOPSY  Patient Location: PACU  Anesthesia Type:General  Level of Consciousness: awake and alert   Airway & Oxygen  Therapy: Patient Spontanous Breathing  Post-op Assessment: Report given to RN  Post vital signs: Reviewed and stable  Last Vitals:  Vitals Value Taken Time  BP 131/66 08/03/23 1610  Temp    Pulse 85 08/03/23 1616  Resp 17 08/03/23 1616  SpO2 94 % 08/03/23 1616  Vitals shown include unfiled device data.  Last Pain:  Vitals:   08/03/23 1303  TempSrc:   PainSc: 0-No pain         Complications: No notable events documented.

## 2023-08-03 NOTE — Interval H&P Note (Signed)
 History and Physical Interval Note:  08/03/2023 2:08 PM  John [name]  has presented today for surgery, with the diagnosis of LUNG MASS.  The various methods of treatment have been discussed with the patient and family. After consideration of risks, benefits and other options for treatment, the patient has consented to  Procedure(s) with comments: VIDEO BRONCHOSCOPY WITH ENDOBRONCHIAL NAVIGATION (Bilateral) - WITH FLUORO as a surgical intervention.  The patient's history has been reviewed, patient examined, no change in status, stable for surgery.  I have reviewed the patient's chart and labs.  Questions were answered to the patient's satisfaction.     Denson Flake

## 2023-08-04 ENCOUNTER — Telehealth: Payer: Self-pay | Admitting: *Deleted

## 2023-08-04 NOTE — Telephone Encounter (Signed)
 Patient left a voicemail. He was wanting to reschedule his office appointment for May 2 as he has another appointment. He has days that would work better with his POA for transportation, per patient.  This has been sent to the front desk. Patient shared that his blood sugar was 241, this morning. He is injecting is insulin  as directed.  CGM data printed out for Whitney to review.  Per Laurina Popper the patient should increase his insulin  to 26 units, as this will help as he is coming off of the prednisone . Patient was called and made aware. He will be taking his last Prednisone  on tomorrow, April 30. Patient was advised that he is to drink water , eat healthy meals, and to avoid sugary drinks, candy, ect.

## 2023-08-05 ENCOUNTER — Telehealth: Payer: Self-pay | Admitting: *Deleted

## 2023-08-05 ENCOUNTER — Encounter (HOSPITAL_COMMUNITY): Payer: Self-pay | Admitting: Emergency Medicine

## 2023-08-05 DIAGNOSIS — Z885 Allergy status to narcotic agent status: Secondary | ICD-10-CM | POA: Diagnosis not present

## 2023-08-05 DIAGNOSIS — I10 Essential (primary) hypertension: Secondary | ICD-10-CM | POA: Diagnosis not present

## 2023-08-05 DIAGNOSIS — C3411 Malignant neoplasm of upper lobe, right bronchus or lung: Secondary | ICD-10-CM | POA: Diagnosis not present

## 2023-08-05 DIAGNOSIS — J9621 Acute and chronic respiratory failure with hypoxia: Secondary | ICD-10-CM | POA: Diagnosis not present

## 2023-08-05 DIAGNOSIS — C3490 Malignant neoplasm of unspecified part of unspecified bronchus or lung: Secondary | ICD-10-CM | POA: Diagnosis not present

## 2023-08-05 DIAGNOSIS — J9601 Acute respiratory failure with hypoxia: Secondary | ICD-10-CM | POA: Diagnosis not present

## 2023-08-05 DIAGNOSIS — R079 Chest pain, unspecified: Secondary | ICD-10-CM | POA: Diagnosis not present

## 2023-08-05 DIAGNOSIS — I110 Hypertensive heart disease with heart failure: Secondary | ICD-10-CM | POA: Diagnosis not present

## 2023-08-05 DIAGNOSIS — J441 Chronic obstructive pulmonary disease with (acute) exacerbation: Secondary | ICD-10-CM | POA: Diagnosis not present

## 2023-08-05 DIAGNOSIS — J9620 Acute and chronic respiratory failure, unspecified whether with hypoxia or hypercapnia: Secondary | ICD-10-CM | POA: Diagnosis not present

## 2023-08-05 DIAGNOSIS — Z7984 Long term (current) use of oral hypoglycemic drugs: Secondary | ICD-10-CM | POA: Diagnosis not present

## 2023-08-05 DIAGNOSIS — C3410 Malignant neoplasm of upper lobe, unspecified bronchus or lung: Secondary | ICD-10-CM | POA: Diagnosis not present

## 2023-08-05 DIAGNOSIS — Z9989 Dependence on other enabling machines and devices: Secondary | ICD-10-CM | POA: Diagnosis not present

## 2023-08-05 DIAGNOSIS — R918 Other nonspecific abnormal finding of lung field: Secondary | ICD-10-CM | POA: Diagnosis not present

## 2023-08-05 DIAGNOSIS — Z20822 Contact with and (suspected) exposure to covid-19: Secondary | ICD-10-CM | POA: Diagnosis not present

## 2023-08-05 DIAGNOSIS — Z881 Allergy status to other antibiotic agents status: Secondary | ICD-10-CM | POA: Diagnosis not present

## 2023-08-05 DIAGNOSIS — N400 Enlarged prostate without lower urinary tract symptoms: Secondary | ICD-10-CM | POA: Diagnosis not present

## 2023-08-05 DIAGNOSIS — Z1152 Encounter for screening for COVID-19: Secondary | ICD-10-CM | POA: Diagnosis not present

## 2023-08-05 DIAGNOSIS — R0602 Shortness of breath: Secondary | ICD-10-CM | POA: Diagnosis not present

## 2023-08-05 DIAGNOSIS — R457 State of emotional shock and stress, unspecified: Secondary | ICD-10-CM | POA: Diagnosis not present

## 2023-08-05 DIAGNOSIS — E1151 Type 2 diabetes mellitus with diabetic peripheral angiopathy without gangrene: Secondary | ICD-10-CM | POA: Diagnosis not present

## 2023-08-05 DIAGNOSIS — J9691 Respiratory failure, unspecified with hypoxia: Secondary | ICD-10-CM | POA: Diagnosis not present

## 2023-08-05 DIAGNOSIS — K219 Gastro-esophageal reflux disease without esophagitis: Secondary | ICD-10-CM | POA: Diagnosis not present

## 2023-08-05 DIAGNOSIS — E785 Hyperlipidemia, unspecified: Secondary | ICD-10-CM | POA: Diagnosis not present

## 2023-08-05 DIAGNOSIS — Z7951 Long term (current) use of inhaled steroids: Secondary | ICD-10-CM | POA: Diagnosis not present

## 2023-08-05 DIAGNOSIS — Z79899 Other long term (current) drug therapy: Secondary | ICD-10-CM | POA: Diagnosis not present

## 2023-08-05 DIAGNOSIS — Z888 Allergy status to other drugs, medicaments and biological substances status: Secondary | ICD-10-CM | POA: Diagnosis not present

## 2023-08-05 DIAGNOSIS — R0902 Hypoxemia: Secondary | ICD-10-CM | POA: Diagnosis not present

## 2023-08-05 DIAGNOSIS — Z882 Allergy status to sulfonamides status: Secondary | ICD-10-CM | POA: Diagnosis not present

## 2023-08-05 DIAGNOSIS — R0689 Other abnormalities of breathing: Secondary | ICD-10-CM | POA: Diagnosis not present

## 2023-08-05 DIAGNOSIS — Z7982 Long term (current) use of aspirin: Secondary | ICD-10-CM | POA: Diagnosis not present

## 2023-08-05 DIAGNOSIS — R062 Wheezing: Secondary | ICD-10-CM | POA: Diagnosis not present

## 2023-08-05 DIAGNOSIS — I5032 Chronic diastolic (congestive) heart failure: Secondary | ICD-10-CM | POA: Diagnosis not present

## 2023-08-05 DIAGNOSIS — G4733 Obstructive sleep apnea (adult) (pediatric): Secondary | ICD-10-CM | POA: Diagnosis not present

## 2023-08-05 NOTE — Telephone Encounter (Signed)
 Patient was called to make aware that per Hulon Magic ,NP said that he could give 30 units of the Tresiba  at night. That would be as high as he could go , being that he just took his last prednisone . Patient was not available, as he is in the ED at [facility] due to shortness of breathe. This information was shared with Judy,his friend. She states that she will keep us  posted on his condition.

## 2023-08-05 NOTE — Telephone Encounter (Signed)
Noted, thank you Tammy.

## 2023-08-06 DIAGNOSIS — J441 Chronic obstructive pulmonary disease with (acute) exacerbation: Secondary | ICD-10-CM | POA: Diagnosis not present

## 2023-08-06 DIAGNOSIS — N400 Enlarged prostate without lower urinary tract symptoms: Secondary | ICD-10-CM | POA: Diagnosis not present

## 2023-08-06 DIAGNOSIS — J9601 Acute respiratory failure with hypoxia: Secondary | ICD-10-CM | POA: Diagnosis not present

## 2023-08-06 DIAGNOSIS — E1151 Type 2 diabetes mellitus with diabetic peripheral angiopathy without gangrene: Secondary | ICD-10-CM | POA: Diagnosis not present

## 2023-08-06 DIAGNOSIS — Z9989 Dependence on other enabling machines and devices: Secondary | ICD-10-CM | POA: Diagnosis not present

## 2023-08-06 DIAGNOSIS — Z7982 Long term (current) use of aspirin: Secondary | ICD-10-CM | POA: Diagnosis not present

## 2023-08-06 DIAGNOSIS — E785 Hyperlipidemia, unspecified: Secondary | ICD-10-CM | POA: Diagnosis not present

## 2023-08-06 DIAGNOSIS — I10 Essential (primary) hypertension: Secondary | ICD-10-CM | POA: Diagnosis not present

## 2023-08-06 DIAGNOSIS — C3490 Malignant neoplasm of unspecified part of unspecified bronchus or lung: Secondary | ICD-10-CM | POA: Diagnosis not present

## 2023-08-06 DIAGNOSIS — Z79899 Other long term (current) drug therapy: Secondary | ICD-10-CM | POA: Diagnosis not present

## 2023-08-07 ENCOUNTER — Telehealth: Payer: Self-pay | Admitting: *Deleted

## 2023-08-07 ENCOUNTER — Ambulatory Visit: Admitting: Nurse Practitioner

## 2023-08-07 LAB — CYTOLOGY - NON PAP

## 2023-08-07 NOTE — Transitions of Care (Post Inpatient/ED Visit) (Signed)
   08/07/2023  Name: John [name] MRN: [id number] DOB: 11/18/1945  Today's TOC FU Call Status: Today's TOC FU Call Status:: Unsuccessful Call (1st Attempt) Unsuccessful Call (1st Attempt) Date: 08/07/23  Attempted to reach the patient regarding the most recent Inpatient/ED visit.  Follow Up Plan: Additional outreach attempts will be made to reach the patient to complete the Transitions of Care (Post Inpatient/ED visit) call.   Cecilie Coffee [facility], BSN RN Care Manager/ Transition of Care [place]/ [facility] [phone]

## 2023-08-10 ENCOUNTER — Telehealth: Payer: Self-pay | Admitting: *Deleted

## 2023-08-10 NOTE — Transitions of Care (Post Inpatient/ED Visit) (Signed)
 08/10/2023  Name: John [name] MRN: [id number] DOB: [birth year]  Today's TOC FU Call Status: Today's TOC FU Call Status:: Successful TOC FU Call Completed TOC FU Call Complete Date: 08/10/23 Patient's Name and Date of Birth confirmed.  Transition Care Management Follow-up Telephone Call Date of Discharge: 08/06/23 Discharge Facility: Other [facility]) Name of Other (Non[facility]) Discharge Facility: [facility] Type of Discharge: Inpatient Admission Primary Inpatient Discharge Diagnosis:: Dyspnea How have you been since you were released from the hospital?: Better (eating, drinking well, ambulating without difficulty, no issues with bowel/ bladder) Any questions or concerns?: No  Items Reviewed: Did you receive and understand the discharge instructions provided?: Yes Medications obtained,verified, and reconciled?: Yes (Medications Reviewed) Any new allergies since your discharge?: No Dietary orders reviewed?: Yes Do you have support at home?: Yes Name of Support/Comfort Primary Source: pt does not specify, states " I have plenty of help" Reviewed oxygen  safety, no smoking around oxygen  Reviewed HF and COPD action plan Reviewed energy conservation  Medications Reviewed Today: Medications Reviewed Today     Reviewed by Daralyn Earl, RN (Registered Nurse) on 08/10/23 at 1413  Med List Status: <None>   Medication Order Taking? Sig Documenting Provider Last Dose Status Informant  albuterol  (VENTOLIN  HFA) 108 (90 Base) MCG/ACT inhaler [id number] Yes INHALE 2 PUFFS INTO THE LUNGS EVERY 6 HOURS AS NEEDED FOR WHEEZE OR SHORTNESS OF BREATH Hawks, Christy A, FNP Taking Active   allopurinol  (ZYLOPRIM ) 300 MG tablet [id number] Yes Take 1 tablet (300 mg total) by mouth daily. Yevette Hem, FNP Taking Active   bisoprolol  (ZEBETA ) 5 MG tablet [id number] Yes Take 5 mg by mouth daily. [provider] Taking Active   Blood Glucose Monitoring Suppl (ONETOUCH VERIO REFLECT)  w/Device KIT [id number] Yes Use to test blood sugars daily as directed. DX: E11.9 Yevette Hem, FNP Taking Active Self, Spouse/Significant Other, Pharmacy Records  clopidogrel  (PLAVIX ) 75 MG tablet [id number] Yes Take 1 tablet (75 mg total) by mouth daily. Young Hensen, MD Taking Active Spouse/Significant Other, Self, Pharmacy Records           Med Note Cornelia Dieter, SEBASTIAN   Sat May 30, 2023  3:14 PM)    Continuous Glucose Receiver (FREESTYLE LIBRE 3 READER) DEVI [id number] Yes 1 Device by Does not apply route every 14 (fourteen) days. Yevette Hem, FNP Taking Active Self, Spouse/Significant Other, Pharmacy Records  Continuous Glucose Sensor (FREESTYLE LIBRE 3 SENSOR) [place] [id number] Yes 1 Device by Does not apply route continuous. Yevette Hem, FNP Taking Active Self, Spouse/Significant Other, Pharmacy Records  diltiazem  (CARDIZEM  CD) 120 MG 24 hr capsule [id number] No Take by mouth daily as needed.  Patient not taking: Reported on 08/10/2023   [provider] Not Taking Active   docusate sodium  (COLACE) 100 MG capsule [id number] Yes Take 1 capsule (100 mg total) by mouth daily as needed for mild constipation. Audria Leather, MD Taking Active   dutasteride  (AVODART ) 0.5 MG capsule [id number] Yes Take 1 capsule (0.5 mg total) by mouth daily. McKenzie, Arden Beck, MD Taking Active Self, Spouse/Significant Other, Pharmacy Records  famotidine  (PEPCID ) 20 MG tablet [id number] Yes Take 1 tablet (20 mg total) by mouth daily. Doreene Gammon D, DO Taking Active Self, Spouse/Significant Other, Pharmacy Records  ferrous sulfate  325 (65 FE) MG EC tablet [id number] Yes Take 1 tablet (325 mg total) by mouth daily. Audria Leather, MD Taking Active   Fluticasone -Umeclidin-Vilant (TRELEGY ELLIPTA ) 100-62.5-25 MCG/ACT AEPB [id number] Yes Inhale 1 puff into the lungs  daily. Yevette Hem, FNP Taking Active Self, Spouse/Significant Other, Pharmacy Records  gabapentin  (NEURONTIN ) 300 MG capsule [id number]  Yes Take 1-2 capsules (300-600 mg total) by mouth 2 (two) times daily. Take 300mg  (1 cap) in the morning and 600mg  (2 caps) in the evening for a total daily dose of 900mg . Audria Leather, MD Taking Active   Insulin  Degludec FlexTouch 200 UNIT/ML SOPN [id number] Yes Inject 16 units Tresiba  subcutaneus at bedtime as directed by the provider Denson Flake, MD Taking Active   Insulin  Pen Needle (BD PEN NEEDLE NANO U/F) 32G X 4 MM MISC [id number] Yes Use to inject insulin  once daily Wendel Hals, NP Taking Active Self, Spouse/Significant Other, Pharmacy Records  levothyroxine  (SYNTHROID ) 50 MCG tablet [id number] Yes Take 1 tablet (50 mcg total) by mouth daily. Wendel Hals, NP Taking Active Self, Spouse/Significant Other, Pharmacy Records  linaclotide  (LINZESS ) 72 MCG capsule [id number] Yes Take 1 capsule (72 mcg total) by mouth daily before breakfast. Yevette Hem, FNP Taking Active Self, Spouse/Significant Other, Pharmacy Records           Med Note Cornelia Dieter, SEBASTIAN   Sat May 30, 2023  3:17 PM) Patient reports intermittent non-compliance, states medication causes diarrhea  megestrol  (MEGACE ) 400 MG/10ML suspension [id number] Yes Take 10 mLs (400 mg total) by mouth 2 (two) times daily. Paulett Boros, MD Taking Active   metolazone  (ZAROXOLYN ) 2.5 MG tablet [id number] Yes Take 2.5 mg by mouth 3 (three) times a week. [provider] Taking Active   NON FORMULARY [id number] Yes CPAP at bedtime [provider] Taking Active Self, Spouse/Significant Other, Pharmacy Records  ondansetron  (ZOFRAN ) 4 MG tablet [id number] Yes Take 1 tablet (4 mg total) by mouth every 8 (eight) hours as needed for nausea or vomiting. Yevette Hem, FNP Taking Active Self, Spouse/Significant Other, Pharmacy Records  [facility] Lancets 30G [place] [id number] Yes Use to test blood sugars daily as directed. DX: E11.9 Wendel Hals, NP Taking Active Self, Spouse/Significant Other, Pharmacy Records   OXYGEN  [id number] Yes Inhale 2 L into the lungs daily as needed (with Cpap at night). [provider] Taking Active Self, Spouse/Significant Other, Pharmacy Records  pantoprazole  (PROTONIX ) 40 MG tablet [id number] Yes Take 1 tablet (40 mg total) by mouth daily. Yevette Hem, FNP Taking Active   pravastatin  (PRAVACHOL ) 40 MG tablet [id number] Yes TAKE 1 TABLET (40 MG TOTAL) BY MOUTH ON MONDAYS , WEDNESDAYS, AND FRIDAYS AS DIRECTED Hawks, Christy A, FNP Taking Active   propranolol  (INDERAL ) 10 MG tablet [id number] Yes Take 1 tablet (10 mg total) by mouth 3 (three) times daily as needed. Tania Familia, NP Taking Active Self, Spouse/Significant Other, Pharmacy Records  spironolactone  (ALDACTONE ) 25 MG tablet [id number] No Take 25 mg by mouth 2 (two) times daily.  Patient not taking: Reported on 08/10/2023   [provider] Not Taking Active   torsemide  (DEMADEX ) 20 MG tablet [id number] Yes Take 0.5 tablets (10 mg total) by mouth daily. Yevette Hem, FNP Taking Active   zolpidem  (AMBIEN ) 5 MG tablet [id number] Yes Take 1 tablet (5 mg total) by mouth at bedtime as needed for sleep. Yevette Hem, FNP Taking Active             Home Care and Equipment/Supplies: Were Home Health Services Ordered?: Yes Name of Home Health Agency:: [facility]- pt states he spoke with agency and they are calling him back with a date and time Has Agency set up a time to come to your  home?: No EMR reviewed for Home Health Orders: Orders present/patient has not received call (refer to CM for follow-up) Any new equipment or medical supplies ordered?: No  Functional Questionnaire: Do you need assistance with bathing/showering or dressing?: No Do you need assistance with meal preparation?: No Do you need assistance with eating?: No Do you have difficulty maintaining continence: No Do you need assistance with getting out of bed/getting out of a chair/moving?: No Do you have difficulty managing or  taking your medications?: No  Follow up appointments reviewed: PCP Follow-up appointment confirmed?: Yes Date of PCP follow-up appointment?: 08/12/23 Follow-up Provider: @ 1150 am primary care provider Specialist Hospital Follow-up appointment confirmed?: Yes Date of Specialist follow-up appointment?: 08/11/23 Follow-Up Specialty Provider:: [facility]  p[facility] Do you need transportation to your follow-up appointment?: No Do you understand care options if your condition(s) worsen?: Yes-patient verbalized understanding  SDOH Interventions Today    Flowsheet Row Most Recent Value  SDOH Interventions   Food Insecurity Interventions Intervention Not Indicated  Housing Interventions Intervention Not Indicated  Transportation Interventions Intervention Not Indicated  Utilities Interventions Intervention Not Indicated       Cecilie Coffee [facility], BSN RN Care Manager/ Transition of Care [place]/ [facility] [phone]

## 2023-08-11 ENCOUNTER — Ambulatory Visit: Admitting: Acute Care

## 2023-08-11 ENCOUNTER — Encounter: Payer: Self-pay | Admitting: Acute Care

## 2023-08-11 VITALS — BP 136/58 | HR 101 | Ht 66.0 in | Wt 152.2 lb

## 2023-08-11 DIAGNOSIS — J441 Chronic obstructive pulmonary disease with (acute) exacerbation: Secondary | ICD-10-CM | POA: Diagnosis not present

## 2023-08-11 DIAGNOSIS — F17210 Nicotine dependence, cigarettes, uncomplicated: Secondary | ICD-10-CM

## 2023-08-11 DIAGNOSIS — Z85118 Personal history of other malignant neoplasm of bronchus and lung: Secondary | ICD-10-CM | POA: Diagnosis not present

## 2023-08-11 DIAGNOSIS — C3490 Malignant neoplasm of unspecified part of unspecified bronchus or lung: Secondary | ICD-10-CM | POA: Diagnosis not present

## 2023-08-11 DIAGNOSIS — Z9289 Personal history of other medical treatment: Secondary | ICD-10-CM | POA: Diagnosis not present

## 2023-08-11 DIAGNOSIS — F17200 Nicotine dependence, unspecified, uncomplicated: Secondary | ICD-10-CM

## 2023-08-11 DIAGNOSIS — Z9889 Other specified postprocedural states: Secondary | ICD-10-CM | POA: Diagnosis not present

## 2023-08-11 NOTE — Progress Notes (Signed)
 History of Present Illness John [name] is a 78 y.o. male current every day smoker with a history of  Stage IIIa (T1b N2 cM0) NSCLC, favoring adenocarcinoma: diagnosed 08/20/2016. Recent PET scan shows concern for recurrence. Referred to  Dr. Baldwin Levee 07/2023 for consideration of bronchoscopy with biopsies.   Prior Oncology Treatment Stage IIIa (T1b N2 cM0) NSCLC, favoring adenocarcinoma: -Chemo XRT with weekly carboplatin  and paclitaxel  completed on 08/20/2016. -Consolidation durvalumab  from 09/26/2016 through 09/25/2017. -Reviewed CT chest on 11/07/2019 which showed increasing size of the upper lobe pulmonary nodules with no new nodule in the left upper lobe suspicious for meta stasis.  Also increasing confluent soft tissue in the right suprahilar region, thought to be postradiation changes. -PET scan on 11/21/2019 showed suspected radiation changes in the right paramediastinal/perihilar region although mild hypermetabolism is present in the right retroareolar region.SUV 2.5.  While progressive soft tissue is present in this region, this hypermetabolism remains nonfocal and favors radiation changes.  7 mm central left upper lobe nodule max SUV 1.4.  9 mm aggregate nodule in the lateral right upper lobe max SUV 2.4.  These are minimally progressive from more remote prior scans.  6 mm left lower lobe nodule unchanged.  No hypermetabolic thoracic adenopathy.  -CT chest on 03/23/2020 showed many of the lung nodules are stable.  Couple of nodules have grown by few millimeters.  No new areas seen. - SBRT to the right and left lung lesions, 54 Gray in 3 fractions from 09/10/2020 through 09/17/2020. - SBRT to the RUL and LUL lesions from 06/19/2022 through 06/26/2022 by Dr. Eloise Hake. - CT Chest 07/05/2023 CT Chest showed suspected bilateral upper lobe and right lower lobe disease recurrence. No evidence of distant metastatic disease - PET scan 07/15/2023 showed Bilateral upper lobe and right lower lobe disease  recurrence. No evidence of distant metastatic disease. - 07/2023 Referred to  Pulmonary for consideration of bronchoscopy with biopsies. - 08/03/2023  Biopsy + for right upper lobe non small cell lung cancer, left upper lobe non small cell lung cancer, right lower lobe non small cell lung cancer , squamous cell    08/11/2023 Pt. Presents for  follow up after bronchoscopy with biopsies to better evaluate suspected recurrence in bilateral lungs upper lobes and right lower lobe. He underwent bronchoscopy with biopsies 08/03/2023. He states he did have some blood in his sputum for about 1 day. He had worsening dyspnea after his biopsy that started 1-2 after the biopsy, which resulted in a 4/30 - 08/06/2023 admission for Acute hypoxic resp failure 2/2 COPD exacerbation . He was placed on BiPAP , and treated with oxygen , antibiotics,  ( Doxycycline  and Omnicef ) , bronchodilators and IVF. He was discharged home 08/06/2023.   Pt. States he is back to his baseline. He will complete his antibiotics tomorrow. He is compliant with Trelegy daily and also uses his albuterol  5-6 times a day, depending on his breathing.   We have discussed his biopsy results. They are + for non small cell cancer in the left upper lobe and squamous cell carcinoma in the RLL. There were atypical cells noted in the right upper lobe brushing and Right lower lobe brushing. He has follow up planned with Dr. Pegge Bow on 08/17/2023 to discuss options for treatment. He has had a recent MRI of the brain 07/22/2023 , which was negative for any evidence of metastatic disease.   Test Results: Cytology 08/03/2023 FINAL MICROSCOPIC DIAGNOSIS:  A. LUNG, RUL ENDOBRONCHIAL, BRUSHING:  - Atypical cells present  FINAL MICROSCOPIC DIAGNOSIS:  B. LUNG, LUL, FINE NEEDLE ASPIRATION AND BIOPSIES:  - Non-small cell carcinoma.  See comment.   C. LUNG, LUL, BRUSHING:  - Non-small cell carcinoma   D. LUNG, RLL, FINE NEEDLE ASPIRATION AND BIOPSIES:  -  Non-small cell carcinoma, consistent with squamous cell carcinoma.  See comment.   E. LUNG, RLL, BRUSHING:  - Atypical cells present     Latest Ref Rng & Units 06/29/2023    3:21 PM 06/05/2023   12:30 PM 05/31/2023    6:07 AM  CBC  WBC 4.0 - 10.5 K/uL 11.0  8.4  10.0   Hemoglobin 13.0 - 17.0 g/dL 16.1  09.6  04.5   Hematocrit 39.0 - 52.0 % 43.2  39.0  36.7   Platelets 150 - 400 K/uL 331  377  302        Latest Ref Rng & Units 06/29/2023    3:21 PM 06/05/2023   12:30 PM 05/31/2023    6:07 AM  BMP  Glucose 70 - 99 mg/dL 409  811  914   BUN 8 - 23 mg/dL 22  11  12    Creatinine 0.61 - 1.24 mg/dL 7.82  9.56  2.13   BUN/Creat Ratio 10 - 24  15    Sodium 135 - 145 mmol/L 136  140  136   Potassium 3.5 - 5.1 mmol/L 5.0  4.5  3.9   Chloride 98 - 111 mmol/L 98  96  102   CO2 22 - 32 mmol/L 28  27  25    Calcium 8.9 - 10.3 mg/dL 9.5  9.7  9.2     BNP    Component Value Date/Time   BNP 104.3 (H) 02/13/2023 1233   BNP 191.0 (H) 10/10/2021 1705    ProBNP No results found for: "PROBNP"  PFT No results found for: "FEV1PRE", "FEV1POST", "FVCPRE", "FVCPOST", "TLC", "DLCOUNC", "PREFEV1FVCRT", "PSTFEV1FVCRT"  DG Chest Port 1 View Result Date: 08/03/2023 CLINICAL DATA:  Post bronchoscopy with biopsy. EXAM: PORTABLE CHEST 1 VIEW COMPARISON:  PET-CT 07/09/2023.  Chest radiograph 05/29/2023 FINDINGS: Shallow inspiration. A loop recorder is present. Power port type central venous catheter with tip over the mid SVC region. Left upper lung mass as previously demonstrated. Right paratracheal and right hilar shadows likely representing lymphadenopathy. No pleural effusion or pneumothorax. Azygous lobe. Calcification of the aorta and mitral valve annulus. Degenerative changes in the spine and shoulders. IMPRESSION: 1. Persistent finding of left upper lobe mass and mass or lymphadenopathy along the right paratracheal and right hilar regions. 2. Appliances appear in satisfactory position. Electronically  Signed   By: Boyce Byes M.D.   On: 08/03/2023 17:09   DG C-ARM BRONCHOSCOPY Result Date: 08/03/2023 C-ARM BRONCHOSCOPY: Fluoroscopy was utilized by the requesting physician.  No radiographic interpretation.   DG C-Arm 1-60 Min-No Report Result Date: 08/03/2023 Fluoroscopy was utilized by the requesting physician.  No radiographic interpretation.   CUP PACEART REMOTE DEVICE CHECK Result Date: 08/03/2023 ILR summary report received. Battery status OK. Normal device function. No new symptom, tachy, brady, or pause episodes. No new AF episodes. Monthly summary reports and ROV/PRN 3 AT events, longest duration 6hrs , HR's 115-116 LA, CVRS  MR Brain W Wo Contrast Result Date: 07/22/2023 CLINICAL DATA:  Provided history: Non-small cell lung cancer, right. Metastatic disease evaluation. EXAM: MRI HEAD WITHOUT AND WITH CONTRAST TECHNIQUE: Multiplanar, multiecho pulse sequences of the brain and surrounding structures were obtained without and with intravenous contrast. CONTRAST:  10mL GADAVIST  GADOBUTROL  1 MMOL/ML IV  SOLN COMPARISON:  Head CT 01/28/2022.  Brain MRI 03/25/2019. FINDINGS: Brain: Moderate generalized cerebral atrophy.  Mild cerebellar atrophy. Multifocal T2 FLAIR hyperintense signal abnormality within the cerebral white matter, nonspecific but compatible with moderate chronic small vessel ischemic disease. Small chronic lacunar infarct within the left thalamus, new from the prior MRI (series 14, image 13). There is no acute infarct. No evidence of an intracranial mass. No chronic intracranial blood products. No extra-axial fluid collection. No midline shift. No pathologic intracranial enhancement identified. Vascular: Maintained flow voids within the proximal large arterial vessels. Skull and upper cervical spine: No focal worrisome marrow lesion. Incompletely assessed cervical spondylosis. Sinuses/Orbits: No mass or acute finding within the imaged orbits. Prior bilateral ocular lens  replacement. No significant paranasal sinus disease. IMPRESSION: 1.  No evidence of an acute intracranial abnormality. 2. Moderate cerebral white matter chronic small vessel ischemic disease, similar to the prior brain MRI of 03/25/2019. 3. Small chronic lacunar infarct within the left thalamus, new from the prior MRI. 4. Moderate generalized cerebral atrophy 5. Mild cerebellar atrophy. Electronically Signed   By: Bascom Lily D.O.   On: 07/22/2023 17:23     Past medical hx Past Medical History:  Diagnosis Date   Adenocarcinoma of lung, right (HCC) 04/18/2016   Anxiety    Arthritis    Asthma    Atrial flutter (HCC)    BPH (benign prostatic hyperplasia)    with urinary retention 02/06/20   CHF (congestive heart failure) (HCC)    COPD (chronic obstructive pulmonary disease) (HCC)    Depression    Diabetes mellitus, type II (HCC)    Dyspnea    GERD (gastroesophageal reflux disease)    History of blood transfusion    History of kidney stones    History of radiation therapy    right lung 09/10/2020-09/17/2020   Dr Eloise Hake   History of radiation therapy    Left and right Lung- 06/19/22-06/26/22   Hyperlipidemia    Hypertension    Hypothyroidism    IDA (iron  deficiency anemia)    Macular degeneration    Neuropathy    hands and feet   Non-small cell lung cancer, right (HCC) 04/18/2016   Oxygen  dependent    uses 2L oxygen  as needed   PAD (peripheral artery disease) (HCC) 03/2023   managed by Dr Jimmye Moulds   Peripheral vascular disease [facility])    Prostatitis    Pulmonary nodule, left 07/16/2016   Sleep apnea 2019   uses CPAP nightly     Social History   Tobacco Use   Smoking status: Every Day    Current packs/day: 0.50    Average packs/day: 0.5 packs/day for 62.4 years (31.2 ttl pk-yrs)    Types: Cigarettes    Start date: 03/11/1961   Smokeless tobacco: Never   Tobacco comments:    1/2 pack to 1 pack per day- 07/30/2023, informed to not smoke 24 hrs prior to procedure.   Vaping Use   Vaping status: Never Used  Substance Use Topics   Alcohol use: No   Drug use: No    Mr.Norling reports that he has been smoking cigarettes. He started smoking about 62 years ago. He has a 31.2 pack-year smoking history. He has never used smokeless tobacco. He reports that he does not drink alcohol and does not use drugs.  Tobacco Cessation: Ready to quit: Not Answered Counseling given: Not Answered Tobacco comments: 1/2 pack to 1 pack per day- 07/30/2023, informed to not smoke 24 hrs prior  to procedure. Current everyday smoker, working on quitting Counseled x 3 minutes today on smoking cessation  Past surgical hx, Family hx, Social hx all reviewed.  Current Outpatient Medications on File Prior to Visit  Medication Sig   albuterol  (VENTOLIN  HFA) 108 (90 Base) MCG/ACT inhaler INHALE 2 PUFFS INTO THE LUNGS EVERY 6 HOURS AS NEEDED FOR WHEEZE OR SHORTNESS OF BREATH   allopurinol  (ZYLOPRIM ) 300 MG tablet Take 1 tablet (300 mg total) by mouth daily.   bisoprolol  (ZEBETA ) 5 MG tablet Take 5 mg by mouth daily.   Blood Glucose Monitoring Suppl (ONETOUCH VERIO REFLECT) w/Device KIT Use to test blood sugars daily as directed. DX: E11.9   cefdinir  (OMNICEF ) 300 MG capsule Take 300 mg by mouth.   clopidogrel  (PLAVIX ) 75 MG tablet Take 1 tablet (75 mg total) by mouth daily.   Continuous Glucose Receiver (FREESTYLE LIBRE 3 READER) DEVI 1 Device by Does not apply route every 14 (fourteen) days.   Continuous Glucose Sensor (FREESTYLE LIBRE 3 SENSOR) MISC 1 Device by Does not apply route continuous.   diltiazem  (CARDIZEM  CD) 120 MG 24 hr capsule Take by mouth daily as needed.   docusate sodium  (COLACE) 100 MG capsule Take 1 capsule (100 mg total) by mouth daily as needed for mild constipation.   doxycycline  (ADOXA) 100 MG tablet Take 100 mg by mouth.   dutasteride  (AVODART ) 0.5 MG capsule Take 1 capsule (0.5 mg total) by mouth daily.   famotidine  (PEPCID ) 20 MG tablet Take 1 tablet (20 mg  total) by mouth daily.   ferrous sulfate  325 (65 FE) MG EC tablet Take 1 tablet (325 mg total) by mouth daily.   Fluticasone -Umeclidin-Vilant (TRELEGY ELLIPTA ) 100-62.5-25 MCG/ACT AEPB Inhale 1 puff into the lungs daily.   gabapentin  (NEURONTIN ) 300 MG capsule Take 1-2 capsules (300-600 mg total) by mouth 2 (two) times daily. Take 300mg  (1 cap) in the morning and 600mg  (2 caps) in the evening for a total daily dose of 900mg .   Insulin  Degludec FlexTouch 200 UNIT/ML SOPN Inject 16 units Tresiba  subcutaneus at bedtime as directed by the provider   Insulin  Pen Needle (BD PEN NEEDLE NANO U/F) 32G X 4 MM MISC Use to inject insulin  once daily   levothyroxine  (SYNTHROID ) 50 MCG tablet Take 1 tablet (50 mcg total) by mouth daily.   linaclotide  (LINZESS ) 72 MCG capsule Take 1 capsule (72 mcg total) by mouth daily before breakfast.   megestrol  (MEGACE ) 400 MG/10ML suspension Take 10 mLs (400 mg total) by mouth 2 (two) times daily.   metolazone  (ZAROXOLYN ) 2.5 MG tablet Take 2.5 mg by mouth 3 (three) times a week.   NON FORMULARY CPAP at bedtime   ondansetron  (ZOFRAN ) 4 MG tablet Take 1 tablet (4 mg total) by mouth every 8 (eight) hours as needed for nausea or vomiting.   OneTouch Delica Lancets 30G MISC Use to test blood sugars daily as directed. DX: E11.9   OXYGEN  Inhale 2 L into the lungs daily as needed (with Cpap at night).   pantoprazole  (PROTONIX ) 40 MG tablet Take 1 tablet (40 mg total) by mouth daily.   pravastatin  (PRAVACHOL ) 40 MG tablet TAKE 1 TABLET (40 MG TOTAL) BY MOUTH ON MONDAYS , WEDNESDAYS, AND FRIDAYS AS DIRECTED   propranolol  (INDERAL ) 10 MG tablet Take 1 tablet (10 mg total) by mouth 3 (three) times daily as needed.   spironolactone  (ALDACTONE ) 25 MG tablet Take 25 mg by mouth 2 (two) times daily.   torsemide  (DEMADEX ) 20 MG tablet Take 0.5  tablets (10 mg total) by mouth daily.   zolpidem  (AMBIEN ) 5 MG tablet Take 1 tablet (5 mg total) by mouth at bedtime as needed for sleep.   No  current facility-administered medications on file prior to visit.     Allergies  Allergen Reactions   Bisoprolol  Other (See Comments)    Confusion, delirium    Sulfa  Antibiotics Swelling    Mouth swelling   Atorvastatin Other (See Comments)    Muscle aches - tolerating Pravastatin  40 mg MWF   Jardiance  [Empagliflozin ] Other (See Comments)    FEELS SLUGGISH, TIRED   Lopressor  [Metoprolol ] Other (See Comments)    Fatigue   Rosuvastatin Other (See Comments)    Muscle aches - tolerating Pravastatin  40 mg MWF   Doxycycline  Nausea Only   Temazepam  Other (See Comments)    Made insomnia worse    Tramadol  Itching    Review Of Systems:  Constitutional:   +  weight loss, night sweats,  Fevers, chills, fatigue, or  lassitude.  HEENT:   No headaches,  Difficulty swallowing,  Tooth/dental problems, or  Sore throat,                No sneezing, itching, ear ache, nasal congestion, post nasal drip,   CV:  No chest pain,  Orthopnea, PND, swelling in lower extremities, anasarca, dizziness, palpitations, syncope.   GI  No heartburn, indigestion, abdominal pain, nausea, vomiting, diarrhea, change in bowel habits, + loss of appetite, bloody stools.   Resp: + shortness of breath with exertion or at rest.  + Baseline excess mucus, + baseline productive cough,  + baseline non-productive cough,  No coughing up of blood.  No change in color of mucus.  + Occasional wheezing.  No chest wall deformity  Skin: no rash or lesions.  GU: no dysuria, change in color of urine, no urgency or frequency.  No flank pain, no hematuria   MS:  No joint pain or swelling.  + decreased range of motion.  No back pain.  Psych:  No change in mood or affect. No depression or anxiety.  No memory loss.   Vital Signs BP (!) 136/58 (BP Location: Right Arm, Patient Position: Sitting, Cuff Size: Normal)   Pulse (!) 101   Ht 5\' 6"  (1.676 m)   Wt 152 lb 3.2 oz (69 kg)   BMI 24.57 kg/m    Physical Exam:  General- No  distress,  A&Ox3 ENT: No sinus tenderness, TM clear, pale nasal mucosa, no oral exudate,no post nasal drip, no LAN Cardiac: S1, S2, regular rate and rhythm, no murmur Chest: No wheeze/ rales/ dullness; no accessory muscle use, no nasal flaring, no sternal retractions Abd.: Soft Non-tender Ext: No clubbing cyanosis, edema Neuro:  normal strength Skin: No rashes, warm and dry Psych: normal mood and behavior   Assessment/Plan Recurrent non-small cell cancer of the lung Previous history of non-small cell lung cancer treated with chemotherapy and radiation therapy with consolidation durvalumab   Current every day smoker  Hospitalization 2 days after bronchoscopy with biopsy for COPD exacerbation/hypercarbia/hypoxemia Post bronchoscopy with biopsies 08/03/2023 Plan I am so sorry you had such a bad time after the biopsy procedure. We have reviewed your biopsies, which are positive for recurrent non small cell lung cancer. You have follow up with Dr. Pegge Bow 08/17/2023. He will discuss options for treatment at that time. Continue Trelegy 1 puff once daily Rinse mouth after use. Continue albuterol  as needed for shortness of breath or wheezing. Call if you need  us  sooner.  Continue CPAP with oxygen  at 2 L at bedtime as you have been doing. Please contact office for sooner follow up if symptoms do not improve or worsen or seek emergency care    I spent 35 minutes dedicated to the care of this patient on the date of this encounter to include pre-visit review of records, face-to-face time with the patient discussing conditions above, post visit ordering of testing, clinical documentation with the electronic health record, making appropriate referrals as documented, and communicating necessary information to the patient's healthcare team.    Raejean Bullock, NP 08/11/2023  2:43 PM

## 2023-08-11 NOTE — Patient Instructions (Addendum)
 It is good to see you today. I am so sorry you had such a bad time after the biopsy procedure. We have reviewed your biopsies, which are positive for recurrent non small cell lung cancer. You have follow up with Dr. Pegge Bow 08/17/2023. He will discuss options for treatment at that time. Continue Trelegy 1 puff once daily Rinse mouth after use. Continue albuterol  as needed for shortness of breath or wheezing. Call if you need us  sooner.  Continue CPAP with oxygen  at 2 L at bedtime as you have been doing. Please contact office for sooner follow up if symptoms do not improve or worsen or seek emergency care

## 2023-08-12 ENCOUNTER — Ambulatory Visit: Admitting: Family Medicine

## 2023-08-12 ENCOUNTER — Encounter: Payer: Self-pay | Admitting: Family Medicine

## 2023-08-12 ENCOUNTER — Ambulatory Visit: Admitting: Hematology

## 2023-08-12 VITALS — BP 154/76 | HR 98 | Temp 97.9°F | Ht 66.0 in | Wt 149.0 lb

## 2023-08-12 DIAGNOSIS — R0600 Dyspnea, unspecified: Secondary | ICD-10-CM

## 2023-08-12 DIAGNOSIS — J441 Chronic obstructive pulmonary disease with (acute) exacerbation: Secondary | ICD-10-CM

## 2023-08-12 DIAGNOSIS — J9601 Acute respiratory failure with hypoxia: Secondary | ICD-10-CM

## 2023-08-12 NOTE — Progress Notes (Signed)
 BP (!) 154/76   Pulse 98   Temp 97.9 F (36.6 C)   Ht 5\' 6"  (1.676 m)   Wt 149 lb (67.6 kg)   SpO2 97%   BMI 24.05 kg/m    Subjective:   Patient ID: John [name], male    DOB: January 14, 1946, 78 y.o.   MRN: [id number]  HPI: John [name] is a 78 y.o. male presenting on 08/12/2023 for Hospitalization Follow-up (sob)   HPI Hospital follow-up Patient is coming in today for hospital follow-up.  He was admitted to [facility] on 08/05/2023 and discharged on 08/06/2023.  He was admitted for shortness of breath, worse than his baseline. He was contacted by Cecilie Coffee, RN on 08/10/2023 for transition of care phone visit. He is coming in today for transition of care office visit.  2 days prior to being admitted he had a bronchoscopy looking for disease recurrence of his lung cancer.  Patient feels like after he had a procedure was sedated and laid flat from the previous procedure that he was having a lot of breathing troubles from that and was not getting better and that is what to come into the hospital.  They did give him some antibiotics and treated him with BiPAP and then were able to wean him off the oxygen  and he went home without oxygen .  Today he is here in the office and says his breathing is doing much better and his oxygen  today is 97%.  He is still doing the Trelegy as a rescue inhalers and they are helping.  He denies any fluid overload or weight gain at this point.  His blood pressure is up slightly but with recent hospitalization, and recommended that he just keep an eye on it.  Relevant past medical, surgical, family and social history reviewed and updated as indicated. Interim medical history since our last visit reviewed. Allergies and medications reviewed and updated.  Review of Systems  Constitutional:  Negative for chills, fever and unexpected weight change.  HENT:  Positive for congestion. Negative for ear discharge, ear pain, postnasal drip, rhinorrhea, sinus  pressure, sneezing, sore throat and voice change.   Eyes:  Negative for pain, discharge, redness and visual disturbance.  Respiratory:  Positive for cough and wheezing. Negative for shortness of breath.   Cardiovascular:  Negative for chest pain and leg swelling.  Musculoskeletal:  Negative for back pain and gait problem.  Skin:  Negative for rash.  All other systems reviewed and are negative.   Per HPI unless specifically indicated above   Allergies as of 08/12/2023       Reactions   Bisoprolol  Other (See Comments)   Confusion, delirium   Sulfa  Antibiotics Swelling   Mouth swelling   Atorvastatin Other (See Comments)   Muscle aches - tolerating Pravastatin  40 mg MWF   Jardiance  [empagliflozin ] Other (See Comments)   FEELS SLUGGISH, TIRED   Lopressor  [metoprolol ] Other (See Comments)   Fatigue   Rosuvastatin Other (See Comments)   Muscle aches - tolerating Pravastatin  40 mg MWF   Doxycycline  Nausea Only   Temazepam  Other (See Comments)   Made insomnia worse    Tramadol  Itching        Medication List        Accurate as of Aug 12, 2023 12:24 PM. If you have any questions, ask your nurse or doctor.          albuterol  108 (90 Base) MCG/ACT inhaler Commonly known as: VENTOLIN  HFA INHALE  2 PUFFS INTO THE LUNGS EVERY 6 HOURS AS NEEDED FOR WHEEZE OR SHORTNESS OF BREATH   allopurinol  300 MG tablet Commonly known as: ZYLOPRIM  Take 1 tablet (300 mg total) by mouth daily.   BD Pen Needle Nano U/F 32G X 4 MM Misc Generic drug: Insulin  Pen Needle Use to inject insulin  once daily   bisoprolol  5 MG tablet Commonly known as: ZEBETA  Take 5 mg by mouth daily.   clopidogrel  75 MG tablet Commonly known as: Plavix  Take 1 tablet (75 mg total) by mouth daily.   diltiazem  120 MG 24 hr capsule Commonly known as: CARDIZEM  CD Take by mouth daily as needed.   docusate sodium  100 MG capsule Commonly known as: Colace Take 1 capsule (100 mg total) by mouth daily as needed for mild  constipation.   dutasteride  0.5 MG capsule Commonly known as: AVODART  Take 1 capsule (0.5 mg total) by mouth daily.   famotidine  20 MG tablet Commonly known as: PEPCID  Take 1 tablet (20 mg total) by mouth daily.   ferrous sulfate  325 (65 FE) MG EC tablet Take 1 tablet (325 mg total) by mouth daily.   FreeStyle Libre 3 Reader Devi 1 Device by Does not apply route every 14 (fourteen) days.   FreeStyle Libre 3 Sensor Misc 1 Device by Does not apply route continuous.   gabapentin  300 MG capsule Commonly known as: NEURONTIN  Take 1-2 capsules (300-600 mg total) by mouth 2 (two) times daily. Take 300mg  (1 cap) in the morning and 600mg  (2 caps) in the evening for a total daily dose of 900mg .   Insulin  Degludec FlexTouch 200 UNIT/ML Sopn Inject 16 units Tresiba  subcutaneus at bedtime as directed by the provider   levothyroxine  50 MCG tablet Commonly known as: SYNTHROID  Take 1 tablet (50 mcg total) by mouth daily.   linaclotide  72 MCG capsule Commonly known as: Linzess  Take 1 capsule (72 mcg total) by mouth daily before breakfast.   megestrol  400 MG/10ML suspension Commonly known as: MEGACE  Take 10 mLs (400 mg total) by mouth 2 (two) times daily.   metolazone  2.5 MG tablet Commonly known as: ZAROXOLYN  Take 2.5 mg by mouth 3 (three) times a week.   NON FORMULARY CPAP at bedtime   ondansetron  4 MG tablet Commonly known as: Zofran  Take 1 tablet (4 mg total) by mouth every 8 (eight) hours as needed for nausea or vomiting.   OneTouch Delica Lancets 30G Misc Use to test blood sugars daily as directed. DX: E11.9   OneTouch Verio Reflect w/Device Kit Use to test blood sugars daily as directed. DX: E11.9   OXYGEN  Inhale 2 L into the lungs daily as needed (with Cpap at night).   pantoprazole  40 MG tablet Commonly known as: PROTONIX  Take 1 tablet (40 mg total) by mouth daily.   pravastatin  40 MG tablet Commonly known as: PRAVACHOL  TAKE 1 TABLET (40 MG TOTAL) BY MOUTH ON  MONDAYS , WEDNESDAYS, AND FRIDAYS AS DIRECTED   propranolol  10 MG tablet Commonly known as: INDERAL  Take 1 tablet (10 mg total) by mouth 3 (three) times daily as needed.   spironolactone  25 MG tablet Commonly known as: ALDACTONE  Take 25 mg by mouth 2 (two) times daily.   torsemide  20 MG tablet Commonly known as: DEMADEX  Take 0.5 tablets (10 mg total) by mouth daily.   Trelegy Ellipta  100-62.5-25 MCG/ACT Aepb Generic drug: Fluticasone -Umeclidin-Vilant Inhale 1 puff into the lungs daily.   zolpidem  5 MG tablet Commonly known as: AMBIEN  Take 1 tablet (5 mg total) by  mouth at bedtime as needed for sleep.         Objective:   BP (!) 154/76   Pulse 98   Temp 97.9 F (36.6 C)   Ht 5\' 6"  (1.676 m)   Wt 149 lb (67.6 kg)   SpO2 97%   BMI 24.05 kg/m   Wt Readings from Last 3 Encounters:  08/12/23 149 lb (67.6 kg)  08/11/23 152 lb 3.2 oz (69 kg)  08/10/23 151 lb (68.5 kg)    Physical Exam Vitals and nursing note reviewed.  Constitutional:      General: He is not in acute distress.    Appearance: He is well-developed. He is not diaphoretic.  Eyes:     General: No scleral icterus.       Right eye: No discharge.     Conjunctiva/sclera: Conjunctivae normal.     Pupils: Pupils are equal, round, and reactive to light.  Neck:     Thyroid : No thyromegaly.  Cardiovascular:     Rate and Rhythm: Normal rate and regular rhythm.     Heart sounds: Murmur (Holosystolic best heard at left second intercostal space, 3 out of 6) heard.  Pulmonary:     Effort: Pulmonary effort is normal. No respiratory distress.     Breath sounds: Wheezing (Lower lobe wheezes on both sides, end expiratory) present.  Musculoskeletal:        General: Normal range of motion.     Cervical back: Neck supple.  Lymphadenopathy:     Cervical: No cervical adenopathy.  Skin:    General: Skin is warm and dry.     Findings: No rash.  Neurological:     Mental Status: He is alert and oriented to person,  place, and time.     Coordination: Coordination normal.  Psychiatric:        Behavior: Behavior normal.       Assessment & Plan:   Problem List Items Addressed This Visit   None Visit Diagnoses       Acute hypoxic respiratory failure (HCC)    -  Primary   Relevant Orders   CBC with Differential/Platelet   CMP14+EGFR     COPD exacerbation (HCC)       Relevant Orders   CBC with Differential/Platelet   CMP14+EGFR       He finished the antibiotics already and it seems like his breathing is doing well.  He is off oxygen  at 97% here in the office today.  Continue to monitor blood pressures but otherwise he seems like he is doing well, will check some blood work on the way out today.  Call back if worsens. Follow up plan: Return if symptoms worsen or fail to improve, for 1 month follow-up with PCP.  Counseling provided for all of the vaccine components Orders Placed This Encounter  Procedures   CBC with Differential/Platelet   CMP14+EGFR    Jolyne Needs, MD [facility] 08/12/2023, 12:24 PM

## 2023-08-13 ENCOUNTER — Other Ambulatory Visit: Payer: Self-pay

## 2023-08-13 LAB — CBC WITH DIFFERENTIAL/PLATELET
Basophils Absolute: 0 10*3/uL (ref 0.0–0.2)
Basos: 0 %
EOS (ABSOLUTE): 0.2 10*3/uL (ref 0.0–0.4)
Eos: 2 %
Hematocrit: 38.2 % (ref 37.5–51.0)
Hemoglobin: 13 g/dL (ref 13.0–17.7)
Immature Grans (Abs): 0.1 10*3/uL (ref 0.0–0.1)
Immature Granulocytes: 1 %
Lymphocytes Absolute: 0.7 10*3/uL (ref 0.7–3.1)
Lymphs: 6 %
MCH: 32.6 pg (ref 26.6–33.0)
MCHC: 34 g/dL (ref 31.5–35.7)
MCV: 96 fL (ref 79–97)
Monocytes Absolute: 1.2 10*3/uL — ABNORMAL HIGH (ref 0.1–0.9)
Monocytes: 10 %
Neutrophils Absolute: 9.3 10*3/uL — ABNORMAL HIGH (ref 1.4–7.0)
Neutrophils: 81 %
Platelets: 250 10*3/uL (ref 150–450)
RBC: 3.99 x10E6/uL — ABNORMAL LOW (ref 4.14–5.80)
RDW: 14.4 % (ref 11.6–15.4)
WBC: 11.4 10*3/uL — ABNORMAL HIGH (ref 3.4–10.8)

## 2023-08-13 LAB — CMP14+EGFR
ALT: 19 IU/L (ref 0–44)
AST: 12 IU/L (ref 0–40)
Albumin: 3.7 g/dL — ABNORMAL LOW (ref 3.8–4.8)
Alkaline Phosphatase: 122 IU/L — ABNORMAL HIGH (ref 44–121)
BUN/Creatinine Ratio: 18 (ref 10–24)
BUN: 14 mg/dL (ref 8–27)
Bilirubin Total: 0.4 mg/dL (ref 0.0–1.2)
CO2: 25 mmol/L (ref 20–29)
Calcium: 8.9 mg/dL (ref 8.6–10.2)
Chloride: 98 mmol/L (ref 96–106)
Creatinine, Ser: 0.8 mg/dL (ref 0.76–1.27)
Globulin, Total: 2.5 g/dL (ref 1.5–4.5)
Glucose: 99 mg/dL (ref 70–99)
Potassium: 3.9 mmol/L (ref 3.5–5.2)
Sodium: 138 mmol/L (ref 134–144)
Total Protein: 6.2 g/dL (ref 6.0–8.5)
eGFR: 91 mL/min/{1.73_m2} (ref >59)

## 2023-08-13 NOTE — Progress Notes (Signed)
 Bsx Loop Recorder

## 2023-08-14 ENCOUNTER — Encounter: Payer: Self-pay | Admitting: Hematology

## 2023-08-14 NOTE — Progress Notes (Signed)
 The proposed treatment discussed in conference is for discussion purpose only and is not a binding recommendation.  The patients have not been physically examined, or presented with their treatment options.  Therefore, final treatment plans cannot be decided.

## 2023-08-16 NOTE — Progress Notes (Signed)
 [facility] [address] [place] [address]    Clinic Day:  08/17/2023  Referring physician: Yevette Hem, FNP  Patient Care Team: Yevette Hem, FNP as PCP - General (Family Medicine) O'Neal, Cathay Clonts, MD as PCP - Cardiology (Cardiology) Boyce Byes, MD as PCP - Electrophysiology (Cardiology) Paulett Boros, MD as Consulting Physician (Hematology) Delilah Fend, [facility] (Pharmacist) O'Neal, Cathay Clonts, MD as Consulting Physician (Cardiology) McKenzie, Arden Beck, MD as Consulting Physician (Urology) Alexia Idler, OD (Optometry) Wendel Hals, NP as Nurse Practitioner (Endocrinology) Ashok Blake, DPM as Consulting Physician (Podiatry) Young Hensen, MD as Consulting Physician (Vascular Surgery)   ASSESSMENT & PLAN:   Assessment: 1.  Stage IIIa (T1b N2 cM0) NSCLC, favoring adenocarcinoma: -Chemo XRT with weekly carboplatin  and paclitaxel  completed on 08/20/2016. -Consolidation durvalumab  from 09/26/2016 through 09/25/2017. -Reviewed CT chest on 11/07/2019 which showed increasing size of the upper lobe pulmonary nodules with no new nodule in the left upper lobe suspicious for meta stasis.  Also increasing confluent soft tissue in the right suprahilar region, thought to be postradiation changes. -PET scan on 11/21/2019 showed suspected radiation changes in the right paramediastinal/perihilar region although mild hypermetabolism is present in the right retroareolar region.SUV 2.5.  While progressive soft tissue is present in this region, this hypermetabolism remains nonfocal and favors radiation changes.  7 mm central left upper lobe nodule max SUV 1.4.  9 mm aggregate nodule in the lateral right upper lobe max SUV 2.4.  These are minimally progressive from more remote prior scans.  6 mm left lower lobe nodule unchanged.  No hypermetabolic thoracic adenopathy. -CT chest on 03/23/2020 showed many of the lung nodules are stable.  Couple  of nodules have grown by few millimeters.  No new areas seen. - SBRT to the right and left lung lesions, 54 Gray in 3 fractions from 09/10/2020 through 09/17/2020. - SBRT to the RUL and LUL lesions from 06/19/2022 through 06/26/2022 by Dr. Eloise Hake. - PET scan (07/09/2023): LUL mass 3.5 x 5.1 cm with SUV 10.8.  Additional hypermetabolic nodules in the right lung both within the post radiation consolidation in the right upper lobe SUV 10.2 and within the medial right lower lobe SUV 8.4 measuring 2 x 2.4 cm.  No adenopathy. - Bronchoscopy and biopsy on 08/03/2023. - Pathology: RLL FNA squamous cell carcinoma (p40 positive and negative for TTF-1).  LUL FNA: Favor squamous cell carcinoma (positive for p40 and show patchy staining for TTF-1).  RUL endobronchial brushing shows atypical cells. - [id number] (07/25/2023): T p53 mutation.  MSI-high not detected.  TMB not evaluable. - [facility] NGS: Pending   2.  Normocytic to macrocytic anemia: - Found to have hemoglobin 6.3 on 10/09/2020 and received 2 units PRBC while hospitalized.  I have reviewed hospitalization records. - EGD on 09/06/2020 with tiny gastric ulcer without bleeding or stigmata. - Blood loss this year most likely postoperative in nature (urological surgery in February and vascular surgery in May).    Plan: 1.  Stage IV squamous cell lung cancer: - PET scan (07/08/2020): Left upper lobe mass measures 3.5 x 5.1 cm with SUV 10.8.  Additional hypermetabolic nodules in the right lung both within the postradiation consolidation in the right upper lobe SUV 10.2 and within the medial right lower lobe SUV 8.4 measuring 2 x 2.4 cm.  No hypermetabolic adenopathy. - He underwent bronchoscopy and biopsy on 08/03/2023.  He was hospitalized at [facility] overnight on 08/05/2023 with respiratory difficulty.  He reports that he gets hospitalized overnight every time he gets a bronchoscopy. - We reviewed results of [employer] from 07/25/2023 which did not show any  targetable mutations. - We reviewed pathology reports of the biopsy which showed squamous cell carcinoma of the right lower lobe lung and likely squamous cell carcinoma of the left upper lobe. - We reviewed brain MRI from 07/12/2023: No metastatic disease. - We discussed systemic therapy options.  [facility] NGS is pending at this time.  If he has PD-L1 status > 50%, we will treat with single agent immunotherapy or combination immunotherapy.  If PD-L1 is > or = 1, combination nivolumab or ipilimumab is also an option.  We can also try Checkmate 9 LA regimen with nivolumab, ipilimumab and 2 cycles of carboplatin  and paclitaxel . - Will see him back in 2 weeks to discuss NGS results and further treatment plan.   2.  Normocytic to macrocytic anemia: - Last Monoferric  was on 04/10/2023.-CBC on 08/12/2023 showed hemoglobin improved to 13.  No indication for parenteral iron  therapy.   3.  Decreased appetite/weight loss: - Continue Megace  400 mg twice daily.  His appetite has improved since the Megace  started.    No orders of the defined types were placed in this encounter.     I,Katie Daubenspeck,acting as a Neurosurgeon for Paulett Boros, MD.,have documented all relevant documentation on the behalf of Paulett Boros, MD,as directed by  Paulett Boros, MD while in the presence of Paulett Boros, MD.   I, Paulett Boros MD, have reviewed the above documentation for accuracy and completeness, and I agree with the above.   Paulett Boros, MD   5/12/202512:45 PM  CHIEF COMPLAINT:   Diagnosis: right NSCLC, favor adenocarcinoma    Cancer Staging  Non-small cell lung cancer, right [facility]) Staging form: Lung, AJCC 8th Edition - Clinical stage from 06/05/2016: Stage IIIA (cT1b(2), cN2, cM0) - Signed by Doretta Gant, PA-C on 08/21/2016    Prior Therapy: 1. Chemoradiation with weekly carboplatin  and paclitaxel , 06/25/16 - 08/20/16. 2. Consolidation durvalumab , 09/26/16 - 09/25/17. 3.  SBRT to RUL, LUL nodules 09/10/20 - 09/17/20 4. SBRT to RUL, LUL nodules 06/19/22 - 06/26/22  Current Therapy: Under workup   HISTORY OF PRESENT ILLNESS:   Oncology History  Non-small cell lung cancer, right (HCC)  04/17/2016 Imaging   CT chest- 1. Examination is positive for bilateral upper lobe pulmonary lesions suspicious for malignancy. Further investigation with PET-CT and tissue sampling is advised. 2. Borderline enlarged paratracheal lymph nodes and right hilar nodes. Cannot rule out metastatic adenopathy. Attention to these areas on PET-CT is advised. 3. Emphysema 4. Aortic atherosclerosis and multi vessel coronary artery calcification.   04/18/2016 Initial Diagnosis   Adenocarcinoma of lung, right (HCC)   04/24/2016 PET scan   1. Adjacent hypermetabolic nodules in the RIGHT upper lobe consistent bronchogenic carcinoma. 2. Suspicion of ipsilateral hilar and paratracheal nodal metastasis. 3. Nodule in the LEFT upper lobe with suspicious morphology has a relatively low metabolic activity for size. Favor synchronous primary bronchogenic carcinoma. 4. No metabolic activity associated with a small LEFT lower lobe pulmonary nodule. Recommend attention on follow-up.   05/06/2016 Imaging   MRI brain- Negative for metastatic disease.   05/28/2016 Procedure   1. Electromagnetic navigational bronchoscopy with brushings, needle     aspirations and biopsies of bilateral upper lobe nodules. 2. Endobronchial ultrasound with mediastinal and hilar lymph node     aspirations. By Dr. [name]   06/03/2016 Pathology Results   Diagnosis 1. Lung,  biopsy, Right upper lobe - BLOOD AND BENIGN BRONCHIAL AND LUNG TISSUE. 2. Lung, biopsy, Left upper lobe - BLOOD AND BENIGN BRONCHIAL AND LUNG TISSUE.   06/03/2016 Pathology Results   Diagnosis TRANSBRONCHIAL NEEDLE ASPIRATION, NAVIGATION, RUL (SPECIMEN 5 OF 7, COLLECTED 05/28/16): MALIGNANT CELLS CONSISTENT WITH NON-SMALL CELL  CARCINOMA Comment There are limited tumor cells in the cell block (insufficient for molecular testing). TTF-1 is positive in a few of the atypical cells. They appear negative for synaptophysin and cytokeratin 5/6. Overall there is limited tumor, but a lung adenocarcinoma is slightly favored. Dr. Portia Brittle has reviewed the case. The case was called to Dr. [name] on 06/03/2016.   06/13/2016 Procedure   Port placed by Dr. Larrie Po   06/25/2016 - 08/15/2016 Chemotherapy   The patient had palonosetron  (ALOXI ) injection 0.25 mg, 0.25 mg, Intravenous,  Once, 6 of 6 cycles  CARBOplatin  (PARAPLATIN ) 220 mg in sodium chloride  0.9 % 250 mL chemo infusion, 220 mg (100 % of original dose 215.8 mg), Intravenous,  Once, 6 of 6 cycles Dose modification:   (original dose 215.8 mg, Cycle 1),   (original dose 215.8 mg, Cycle 5),   (original dose 215.8 mg, Cycle 6),   (original dose 215.8 mg, Cycle 2),   (original dose 215.8 mg, Cycle 3),   (original dose 215.8 mg, Cycle 4)  PACLitaxel  (TAXOL ) 90 mg in dextrose  5 % 250 mL chemo infusion ( for chemotherapy treatment.     06/25/2016 - 08/20/2016 Radiation Therapy   Radiation in [place], [place] by Dr. Denver Flaming.  R lung to 6600 cGy   07/09/2016 - 07/10/2016 Hospital Admission   Admit date: 07/09/2016  Admission diagnosis: Fever and chills Additional comments: Negative work-up, discharged with course of Levaquin    07/09/2016 Treatment Plan Change   Treatment deferred x 1 week due to hospitalization    08/05/2016 - 08/06/2016 Hospital Admission   Admit date: 08/05/2016 Admission diagnosis: Joint pain Additional comments: Treated with steroids with symptomatic improvement   08/25/2016 Imaging   MRI brain- Negative for intracranial metastasis on this motion degraded examination.  Stable mild-to-moderate chronic small vessel ischemic disease.   09/18/2016 PET scan   1. Partial metabolic response. Right upper lobe 2.6 cm hypermetabolic pulmonary nodule, decreased in size and  metabolism. Mildly hypermetabolic right hilar nodal metastasis, decreased in metabolism. Right paratracheal lymph node is no longer hypermetabolic. No new or progressive metastatic disease. 2. Apical left upper lobe 0.9 cm solid pulmonary nodule, slightly decreased in size, with no significant metabolism. The change in size could indicate a neoplastic etiology for this nodule. 3. Additional subcentimeter pulmonary nodules in the left lung are stable and below PET resolution . 4. Additional findings include aortic atherosclerosis, coronary atherosclerosis, moderate emphysema, stable small pericardial effusion/thickening and mild sigmoid diverticulosis.   01/13/2017 Imaging   CT chest with contrast IMPRESSION: 1. No change in size of the hypermetabolic nodular thickening in the RIGHT upper lobe. 2. LEFT upper lobe spiculated nodule is stable. 3. Rounded LEFT lower lobe pulmonary nodule is stable. 4. No evidence of lung cancer progression.   05/08/2017 Adverse Reaction   Development of Hypothyroidism- likely secondary to immunotherapy.  Levothyroxine  therapy initiated.       INTERVAL HISTORY:   John [name] is a 78 y.o. male presenting to clinic today for follow up of right NSCLC, favor adenocarcinoma. He was last seen by me on 07/15/23.  Since his last visit, he underwent staging brain MRI on 07/22/23 showing no evidence of acute intracranial abnormality.  He also  underwent  bronchoscopy with biopsies on 08/03/23 under Dr. Baldwin Levee. Cytology revealed non-small cell carcinoma to LUL and RLL biopsies. Of note, the RLL biopsies were consistent with squamous cell carcinoma.  Today, he states that he is doing well overall. His appetite level is at 7550%. His energy level is at 50%.  PAST MEDICAL HISTORY:   Past 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   Anxiety    Arthritis    Asthma    Atrial flutter (HCC)    BPH (benign  prostatic hyperplasia)    with urinary retention 02/06/20   CHF (congestive heart failure) (HCC)    COPD (chronic obstructive pulmonary disease) (HCC)    Depression    Diabetes mellitus, type II (HCC)    Dyspnea    GERD (gastroesophageal reflux disease)    History of blood transfusion    History of kidney stones    History of radiation therapy    right lung 09/10/2020-09/17/2020   Dr Eloise Hake   History of radiation therapy    Left and right Lung- 06/19/22-06/26/22   Hyperlipidemia    Hypertension    Hypothyroidism    IDA (iron  deficiency anemia)    Macular degeneration    Neuropathy    hands and feet   Non-small cell lung cancer, right (HCC) 04/18/2016   Oxygen  dependent    uses 2L oxygen  as needed   PAD (peripheral artery disease) (HCC) 03/2023   managed by Dr Jimmye Moulds   Peripheral vascular disease [facility])    Prostatitis    Pulmonary nodule, left 07/16/2016   Sleep apnea 2019   uses CPAP nightly    Surgical History: Past Surgical History:  Procedure Laterality Date   A-FLUTTER ABLATION N/A 01/13/2022   Procedure: A-FLUTTER ABLATION;  Surgeon: Boyce Byes, MD;  Location: [facility] INVASIVE CV LAB;  Service: Cardiovascular;  Laterality: N/A;   ABDOMINAL AORTOGRAM W/LOWER EXTREMITY Left 02/06/2020   Procedure: ABDOMINAL AORTOGRAM W/LOWER EXTREMITY;  Surgeon: Avanell Leigh, MD;  Location: [facility] INVASIVE CV LAB;  Service: Cardiovascular;  Laterality: Left;   ABDOMINAL AORTOGRAM W/LOWER EXTREMITY N/A 05/30/2021   Procedure: ABDOMINAL AORTOGRAM W/LOWER EXTREMITY;  Surgeon: Young Hensen, MD;  Location: [facility] INVASIVE CV LAB;  Service: Cardiovascular;  Laterality: N/A;   ABDOMINAL AORTOGRAM W/LOWER EXTREMITY N/A 02/05/2023   Procedure: ABDOMINAL AORTOGRAM W/LOWER EXTREMITY;  Surgeon: Young Hensen, MD;  Location: [facility] INVASIVE CV LAB;  Service: Cardiovascular;  Laterality: N/A;   BIOPSY  09/16/2021   Procedure: BIOPSY;  Surgeon: Kenney Peacemaker, MD;  Location: [facility]  ENDOSCOPY;  Service: Gastroenterology;;   BRONCHIAL BIOPSY  08/03/2023   Procedure: BRONCHOSCOPY, WITH BIOPSY;  Surgeon: Denson Flake, MD;  Location: [facility] ENDOSCOPY;  Service: Pulmonary;;   BRONCHIAL BRUSHINGS  08/03/2023   Procedure: BRONCHOSCOPY, WITH BRUSH BIOPSY;  Surgeon: Denson Flake, MD;  Location: [facility] ENDOSCOPY;  Service: Pulmonary;;   BRONCHIAL NEEDLE ASPIRATION BIOPSY  08/03/2023   Procedure: BRONCHOSCOPY, WITH NEEDLE ASPIRATION BIOPSY;  Surgeon: Denson Flake, MD;  Location: [facility] ENDOSCOPY;  Service: Pulmonary;;   CARDIOVERSION N/A 10/05/2020   Procedure: CARDIOVERSION;  Surgeon: Harrold Lincoln, MD;  Location: [facility] ENDOSCOPY;  Service: Cardiovascular;  Laterality: N/A;   CATARACT EXTRACTION, BILATERAL Bilateral    COLONOSCOPY WITH PROPOFOL  N/A 06/20/2021   Procedure: COLONOSCOPY WITH PROPOFOL ;  Surgeon: Tobin Forts, MD;  Location: [facility] ENDOSCOPY;  Service: Endoscopy;  Laterality: N/A;   COLONOSCOPY WITH PROPOFOL  N/A 09/16/2021  Procedure: COLONOSCOPY WITH PROPOFOL ;  Surgeon: Kenney Peacemaker, MD;  Location: [facility] ENDOSCOPY;  Service: Gastroenterology;  Laterality: N/A;   ENDARTERECTOMY FEMORAL Right 08/20/2020   Procedure: ENDARTERECTOMY  RIGHT FEMORAL ARTERY;  Surgeon: Young Hensen, MD;  Location: [facility] OR;  Service: Vascular;  Laterality: Right;   ENTEROSCOPY N/A 10/11/2021   Procedure: ENTEROSCOPY;  Surgeon: Urban Garden, MD;  Location: [facility] ENDO SUITE;  Service: Gastroenterology;  Laterality: N/A;   ENTEROSCOPY N/A 02/18/2023   Procedure: ENTEROSCOPY;  Surgeon: Suzette Espy, MD;  Location: [facility] ENDO SUITE;  Service: Endoscopy;  Laterality: N/A;  EGD with possible small bowel push enteroscopy   ESOPHAGOGASTRODUODENOSCOPY N/A 09/16/2021   Procedure: ESOPHAGOGASTRODUODENOSCOPY (EGD);  Surgeon: Kenney Peacemaker, MD;  Location: [facility] ENDOSCOPY;  Service: Gastroenterology;  Laterality: N/A;   ESOPHAGOGASTRODUODENOSCOPY (EGD) WITH PROPOFOL  N/A 09/06/2020   Procedure:  ESOPHAGOGASTRODUODENOSCOPY (EGD) WITH PROPOFOL ;  Surgeon: Tobin Forts, MD;  Location: [facility] ENDOSCOPY;  Service: Endoscopy;  Laterality: N/A;   ESOPHAGOGASTRODUODENOSCOPY (EGD) WITH PROPOFOL  N/A 10/11/2021   Procedure: ESOPHAGOGASTRODUODENOSCOPY (EGD) WITH PROPOFOL ;  Surgeon: Urban Garden, MD;  Location: [facility] ENDO SUITE;  Service: Gastroenterology;  Laterality: N/A;   ESOPHAGOGASTRODUODENOSCOPY (EGD) WITH PROPOFOL  N/A 02/18/2023   Procedure: ESOPHAGOGASTRODUODENOSCOPY (EGD) WITH PROPOFOL ;  Surgeon: Suzette Espy, MD;  Location: [facility] ENDO SUITE;  Service: Endoscopy;  Laterality: N/A;   GIVENS CAPSULE STUDY N/A 02/19/2023   Procedure: GIVENS CAPSULE STUDY;  Surgeon: Hargis Lias, MD;  Location: [facility] ENDO SUITE;  Service: Endoscopy;  Laterality: N/A;   HEMOSTASIS CLIP PLACEMENT  09/16/2021   Procedure: HEMOSTASIS CLIP PLACEMENT;  Surgeon: Kenney Peacemaker, MD;  Location: [facility] ENDOSCOPY;  Service: Gastroenterology;;   HOT HEMOSTASIS N/A 09/16/2021   Procedure: HOT HEMOSTASIS (ARGON PLASMA COAGULATION/BICAP);  Surgeon: Kenney Peacemaker, MD;  Location: [facility] ENDOSCOPY;  Service: Gastroenterology;  Laterality: N/A;   HOT HEMOSTASIS  10/11/2021   Procedure: HOT HEMOSTASIS (ARGON PLASMA COAGULATION/BICAP);  Surgeon: Umberto Ganong, Bearl Limes, MD;  Location: [facility] ENDO SUITE;  Service: Gastroenterology;;   HOT HEMOSTASIS  02/18/2023   Procedure: HOT HEMOSTASIS (ARGON PLASMA COAGULATION/BICAP);  Surgeon: Suzette Espy, MD;  Location: [facility] ENDO SUITE;  Service: Endoscopy;;   INSERTION OF ILIAC STENT Right 08/20/2020   Procedure: RETROGRADE INSERTION OF RIGHT ILIAC STENT;  Surgeon: Young Hensen, MD;  Location: [facility] OR;  Service: Vascular;  Laterality: Right;   INTRAOPERATIVE ARTERIOGRAM Right 08/20/2020   Procedure: INTRA OPERATIVE ARTERIOGRAM ILIAC;  Surgeon: Young Hensen, MD;  Location: [facility] OR;  Service: Vascular;  Laterality: Right;   PATCH ANGIOPLASTY Right 08/20/2020   Procedure: PATCH  ANGIOPLASTY RIGHT FEMORAL ARTERY;  Surgeon: Young Hensen, MD;  Location: [facility] OR;  Service: Vascular;  Laterality: Right;   PERIPHERAL VASCULAR BALLOON ANGIOPLASTY Right 05/30/2021   Procedure: PERIPHERAL VASCULAR BALLOON ANGIOPLASTY;  Surgeon: Young Hensen, MD;  Location: [facility] INVASIVE CV LAB;  Service: Cardiovascular;  Laterality: Right;   PERIPHERAL VASCULAR INTERVENTION Right 02/05/2023   Procedure: PERIPHERAL VASCULAR INTERVENTION;  Surgeon: Young Hensen, MD;  Location: [facility] INVASIVE CV LAB;  Service: Cardiovascular;  Laterality: Right;  Rt SFA and Rt Ext Iliac   PORTACATH PLACEMENT Left 06/13/2016   Procedure: INSERTION PORT-A-CATH;  Surgeon: Alanda Allegra, MD;  Location: [facility] ORS;  Service: General;  Laterality: Left;   TRANSURETHRAL RESECTION OF PROSTATE N/A 05/31/2020   Procedure: TRANSURETHRAL RESECTION OF THE PROSTATE (TURP);  Surgeon: Marco Severs, MD;  Location: [facility] ORS;  Service: Urology;  Laterality: N/A;   VIDEO  BRONCHOSCOPY WITH ENDOBRONCHIAL NAVIGATION N/A 05/28/2016   Procedure: VIDEO BRONCHOSCOPY WITH ENDOBRONCHIAL NAVIGATION;  Surgeon: Zelphia Higashi, MD;  Location: [facility] OR;  Service: Thoracic;  Laterality: N/A;   VIDEO BRONCHOSCOPY WITH ENDOBRONCHIAL NAVIGATION Bilateral 08/03/2023   Procedure: VIDEO BRONCHOSCOPY WITH ENDOBRONCHIAL NAVIGATION;  Surgeon: Denson Flake, MD;  Location: [facility] ENDOSCOPY;  Service: Pulmonary;  Laterality: Bilateral;  WITH FLUORO   VIDEO BRONCHOSCOPY WITH ENDOBRONCHIAL ULTRASOUND N/A 05/28/2016   Procedure: VIDEO BRONCHOSCOPY WITH ENDOBRONCHIAL ULTRASOUND;  Surgeon: Zelphia Higashi, MD;  Location: [facility] OR;  Service: Thoracic;  Laterality: N/A;    Social History: Social History   Socioeconomic History   Marital status: Significant Other    Spouse name: Not on file   Number of children: 5   Years of education: 10   Highest education level: 10th grade  Occupational History   Occupation: Retired  Tobacco Use   Smoking  status: Every Day    Current packs/day: 0.50    Average packs/day: 0.5 packs/day for 62.4 years (31.2 ttl pk-yrs)    Types: Cigarettes    Start date: 03/11/1961   Smokeless tobacco: Never   Tobacco comments:    1/2 pack to 1 pack per day- 07/30/2023, informed to not smoke 24 hrs prior to procedure.  Vaping Use   Vaping status: Never Used  Substance and Sexual Activity   Alcohol use: No   Drug use: No   Sexual activity: Yes    Birth control/protection: None  Other Topics Concern   Not on file  Social History Narrative   [name] is his POA and lives with him. Children out of town - one in [facility], others in [place].   Social Drivers of Corporate investment banker Strain: Low Risk  (08/06/2023)   Received from [facility]   Overall Financial Resource Strain (CARDIA)    Difficulty of Paying Living Expenses: Not hard at all  Food Insecurity: No Food Insecurity (08/10/2023)   Hunger Vital Sign    Worried About Running Out of Food in the Last Year: Never true    Ran Out of Food in the Last Year: Never true  Transportation Needs: No Transportation Needs (08/10/2023)   PRAPARE - Administrator, Civil Service (Medical): No    Lack of Transportation (Non-Medical): No  Physical Activity: Insufficiently Active (05/30/2023)   Exercise Vital Sign    Days of Exercise per Week: 3 days    Minutes of Exercise per Session: 10 min  Stress: No Stress Concern Present (05/30/2023)   [employer] of Occupational Health - Occupational Stress Questionnaire    Feeling of Stress : Not at all  Social Connections: Moderately Isolated (05/30/2023)   Social Connection and Isolation Panel [NHANES]    Frequency of Communication with Friends and Family: Twice a week    Frequency of Social Gatherings with Friends and Family: Twice a week    Attends Religious Services: Never    Database administrator or Organizations: No    Attends Banker Meetings: Never    Marital Status:  Married  Catering manager Violence: Not At Risk (08/10/2023)   Humiliation, Afraid, Rape, and Kick questionnaire    Fear of Current or Ex-Partner: No    Emotionally Abused: No    Physically Abused: No    Sexually Abused: No    Family History: Family History  Problem Relation Age of Onset   Hypertension Mother    Diabetes Father  Heart disease Father    Stroke Father    Hypertension Sister    Sleep apnea Neg Hx     Current Medications:  Current Outpatient Medications:    albuterol  (VENTOLIN  HFA) 108 (90 Base) MCG/ACT inhaler, INHALE 2 PUFFS INTO THE LUNGS EVERY 6 HOURS AS NEEDED FOR WHEEZE OR SHORTNESS OF BREATH, Disp: 8.5 each, Rfl: 5   allopurinol  (ZYLOPRIM ) 300 MG tablet, Take 1 tablet (300 mg total) by mouth daily., Disp: 90 tablet, Rfl: 0   bisoprolol  (ZEBETA ) 5 MG tablet, Take 5 mg by mouth daily., Disp: , Rfl:    Blood Glucose Monitoring Suppl (ONETOUCH VERIO REFLECT) w/Device KIT, Use to test blood sugars daily as directed. DX: E11.9, Disp: 1 kit, Rfl: 1   clopidogrel  (PLAVIX ) 75 MG tablet, Take 1 tablet (75 mg total) by mouth daily., Disp: 30 tablet, Rfl: 11   Continuous Glucose Receiver (FREESTYLE LIBRE 3 READER) DEVI, 1 Device by Does not apply route every 14 (fourteen) days., Disp: 1 each, Rfl: 3   Continuous Glucose Sensor (FREESTYLE LIBRE 3 SENSOR) MISC, 1 Device by Does not apply route continuous., Disp: 1 each, Rfl: 2   diltiazem  (CARDIZEM  CD) 120 MG 24 hr capsule, Take by mouth daily as needed., Disp: , Rfl:    docusate sodium  (COLACE) 100 MG capsule, Take 1 capsule (100 mg total) by mouth daily as needed for mild constipation., Disp: , Rfl:    dutasteride  (AVODART ) 0.5 MG capsule, Take 1 capsule (0.5 mg total) by mouth daily., Disp: 90 capsule, Rfl: 3   famotidine  (PEPCID ) 20 MG tablet, Take 1 tablet (20 mg total) by mouth daily., Disp: 30 tablet, Rfl: 1   ferrous sulfate  325 (65 FE) MG EC tablet, Take 1 tablet (325 mg total) by mouth daily., Disp: , Rfl:     Fluticasone -Umeclidin-Vilant (TRELEGY ELLIPTA ) 100-62.5-25 MCG/ACT AEPB, Inhale 1 puff into the lungs daily., Disp: 1 each, Rfl: 11   gabapentin  (NEURONTIN ) 300 MG capsule, Take 1-2 capsules (300-600 mg total) by mouth 2 (two) times daily. Take 300mg  (1 cap) in the morning and 600mg  (2 caps) in the evening for a total daily dose of 900mg ., Disp: , Rfl:    Insulin  Degludec FlexTouch 200 UNIT/ML SOPN, Inject 16 units Tresiba  subcutaneus at bedtime as directed by the provider, Disp: , Rfl:    Insulin  Pen Needle (BD PEN NEEDLE NANO U/F) 32G X 4 MM MISC, Use to inject insulin  once daily, Disp: 100 each, Rfl: 3   levothyroxine  (SYNTHROID ) 50 MCG tablet, Take 1 tablet (50 mcg total) by mouth daily., Disp: 90 tablet, Rfl: 1   linaclotide  (LINZESS ) 72 MCG capsule, Take 1 capsule (72 mcg total) by mouth daily before breakfast., Disp: 90 capsule, Rfl: 1   megestrol  (MEGACE ) 400 MG/10ML suspension, Take 10 mLs (400 mg total) by mouth 2 (two) times daily., Disp: 480 mL, Rfl: 3   metolazone  (ZAROXOLYN ) 2.5 MG tablet, Take 2.5 mg by mouth 3 (three) times a week., Disp: , Rfl:    NON FORMULARY, CPAP at bedtime, Disp: , Rfl:    ondansetron  (ZOFRAN ) 4 MG tablet, Take 1 tablet (4 mg total) by mouth every 8 (eight) hours as needed for nausea or vomiting., Disp: 90 tablet, Rfl: 1   OneTouch Delica Lancets 30G MISC, Use to test blood sugars daily as directed. DX: E11.9, Disp: 100 each, Rfl: 1   OXYGEN , Inhale 2 L into the lungs daily as needed (with Cpap at night)., Disp: , Rfl:    pantoprazole  (PROTONIX ) 40  MG tablet, Take 1 tablet (40 mg total) by mouth daily., Disp: 90 tablet, Rfl: 0   pravastatin  (PRAVACHOL ) 40 MG tablet, TAKE 1 TABLET (40 MG TOTAL) BY MOUTH ON MONDAYS , WEDNESDAYS, AND FRIDAYS AS DIRECTED, Disp: 48 tablet, Rfl: 4   propranolol  (INDERAL ) 10 MG tablet, Take 1 tablet (10 mg total) by mouth 3 (three) times daily as needed., Disp: 90 tablet, Rfl: 6   spironolactone  (ALDACTONE ) 25 MG tablet, Take 25 mg by  mouth 2 (two) times daily., Disp: , Rfl:    torsemide  (DEMADEX ) 20 MG tablet, Take 0.5 tablets (10 mg total) by mouth daily., Disp: 90 tablet, Rfl: 2   zolpidem  (AMBIEN ) 5 MG tablet, Take 1 tablet (5 mg total) by mouth at bedtime as needed for sleep., Disp: 30 tablet, Rfl: 2   Allergies: Allergies  Allergen Reactions   Bisoprolol  Other (See Comments)    Confusion, delirium    Sulfa  Antibiotics Swelling    Mouth swelling   Atorvastatin Other (See Comments)    Muscle aches - tolerating Pravastatin  40 mg MWF   Jardiance  [Empagliflozin ] Other (See Comments)    FEELS SLUGGISH, TIRED   Lopressor  [Metoprolol ] Other (See Comments)    Fatigue   Rosuvastatin Other (See Comments)    Muscle aches - tolerating Pravastatin  40 mg MWF   Doxycycline  Nausea Only   Temazepam  Other (See Comments)    Made insomnia worse    Tramadol  Itching    REVIEW OF SYSTEMS:   Review of Systems  Respiratory:  Positive for cough and shortness of breath.   Gastrointestinal:  Positive for nausea.  Neurological:  Positive for numbness.  All other systems reviewed and are negative.    VITALS:   Blood pressure 132/68, pulse (!) 105, temperature 98.4 F (36.9 C), temperature source Oral, resp. rate 20, weight 145 lb 8.1 oz (66 kg), SpO2 93%.  Wt Readings from Last 3 Encounters:  08/17/23 145 lb 8.1 oz (66 kg)  08/12/23 149 lb (67.6 kg)  08/11/23 152 lb 3.2 oz (69 kg)    Body mass index is 23.48 kg/m.  Performance status (ECOG): 1 - Symptomatic but completely ambulatory  PHYSICAL EXAM:   Physical Exam Vitals reviewed.  Constitutional:      Appearance: Normal appearance.  Cardiovascular:     Rate and Rhythm: Normal rate and regular rhythm.     Heart sounds: Normal heart sounds.  Pulmonary:     Effort: Pulmonary effort is normal.     Breath sounds: Normal breath sounds.  Musculoskeletal:     Right lower leg: No edema.     Left lower leg: No edema.  Neurological:     General: No focal deficit  present.     Mental Status: He is alert and oriented to person, place, and time.  Psychiatric:        Mood and Affect: Mood normal.        Behavior: Behavior normal.     LABS:   CBC     Component Value Date/Time   WBC 11.4 (H) 08/12/2023 1230   WBC 11.0 (H) 06/29/2023 1521   RBC 3.99 (L) 08/12/2023 1230   RBC 4.34 06/29/2023 1521   HGB 13.0 08/12/2023 1230   HCT 38.2 08/12/2023 1230   PLT 250 08/12/2023 1230   MCV 96 08/12/2023 1230   MCH 32.6 08/12/2023 1230   MCH 31.6 06/29/2023 1521   MCHC 34.0 08/12/2023 1230   MCHC 31.7 06/29/2023 1521   RDW 14.4 08/12/2023 1230  LYMPHSABS 0.7 08/12/2023 1230   MONOABS 0.8 06/29/2023 1521   EOSABS 0.2 08/12/2023 1230   BASOSABS 0.0 08/12/2023 1230    CMP      Component Value Date/Time   NA 138 08/12/2023 1230   K 3.9 08/12/2023 1230   CL 98 08/12/2023 1230   CO2 25 08/12/2023 1230   GLUCOSE 99 08/12/2023 1230   GLUCOSE 179 (H) 06/29/2023 1521   BUN 14 08/12/2023 1230   CREATININE 0.80 08/12/2023 1230   CALCIUM 8.9 08/12/2023 1230   PROT 6.2 08/12/2023 1230   ALBUMIN 3.7 (L) 08/12/2023 1230   AST 12 08/12/2023 1230   ALT 19 08/12/2023 1230   ALKPHOS 122 (H) 08/12/2023 1230   BILITOT 0.4 08/12/2023 1230   GFRNONAA >60 06/29/2023 1521   GFRAA 102 05/24/2020 1238     No results found for: "CEA1", "CEA" / No results found for: "CEA1", "CEA" Lab Results  Component Value Date   PSA1 <0.1 10/31/2022   No results found for: "GMW102" No results found for: "CAN125"  Lab Results  Component Value Date   TOTALPROTELP 7.0 04/18/2021   ALBUMINELP 3.7 04/18/2021   A1GS 0.3 04/18/2021   A2GS 0.8 04/18/2021   BETS 1.2 04/18/2021   GAMS 1.0 04/18/2021   MSPIKE Not Observed 04/18/2021   SPEI Comment 04/18/2021   Lab Results  Component Value Date   TIBC 332 06/29/2023   TIBC 425 03/24/2023   TIBC 541 (H) 02/17/2023   FERRITIN 185 06/29/2023   FERRITIN 29 03/24/2023   FERRITIN 10 (L) 02/17/2023   IRONPCTSAT 25  06/29/2023   IRONPCTSAT 49 (H) 03/24/2023   IRONPCTSAT 4 (L) 02/17/2023   Lab Results  Component Value Date   LDH 133 08/02/2018   LDH 126 03/08/2018   LDH 141 10/28/2017     STUDIES:   DG Chest Port 1 View Result Date: 08/03/2023 CLINICAL DATA:  Post bronchoscopy with biopsy. EXAM: PORTABLE CHEST 1 VIEW COMPARISON:  PET-CT 07/09/2023.  Chest radiograph 05/29/2023 FINDINGS: Shallow inspiration. A loop recorder is present. Power port type central venous catheter with tip over the mid SVC region. Left upper lung mass as previously demonstrated. Right paratracheal and right hilar shadows likely representing lymphadenopathy. No pleural effusion or pneumothorax. Azygous lobe. Calcification of the aorta and mitral valve annulus. Degenerative changes in the spine and shoulders. IMPRESSION: 1. Persistent finding of left upper lobe mass and mass or lymphadenopathy along the right paratracheal and right hilar regions. 2. Appliances appear in satisfactory position. Electronically Signed   By: Boyce Byes M.D.   On: 08/03/2023 17:09   DG C-ARM BRONCHOSCOPY Result Date: 08/03/2023 C-ARM BRONCHOSCOPY: Fluoroscopy was utilized by the requesting physician.  No radiographic interpretation.   DG C-Arm 1-60 Min-No Report Result Date: 08/03/2023 Fluoroscopy was utilized by the requesting physician.  No radiographic interpretation.   CUP PACEART REMOTE DEVICE CHECK Result Date: 08/03/2023 ILR summary report received. Battery status OK. Normal device function. No new symptom, tachy, brady, or pause episodes. No new AF episodes. Monthly summary reports and ROV/PRN 3 AT events, longest duration 6hrs , HR's 115-116 LA, CVRS  MR Brain W Wo Contrast Result Date: 07/22/2023 CLINICAL DATA:  Provided history: Non-small cell lung cancer, right. Metastatic disease evaluation. EXAM: MRI HEAD WITHOUT AND WITH CONTRAST TECHNIQUE: Multiplanar, multiecho pulse sequences of the brain and surrounding structures were  obtained without and with intravenous contrast. CONTRAST:  10mL GADAVIST  GADOBUTROL  1 MMOL/ML IV SOLN COMPARISON:  Head CT 01/28/2022.  Brain MRI 03/25/2019. FINDINGS:  Brain: Moderate generalized cerebral atrophy.  Mild cerebellar atrophy. Multifocal T2 FLAIR hyperintense signal abnormality within the cerebral white matter, nonspecific but compatible with moderate chronic small vessel ischemic disease. Small chronic lacunar infarct within the left thalamus, new from the prior MRI (series 14, image 13). There is no acute infarct. No evidence of an intracranial mass. No chronic intracranial blood products. No extra-axial fluid collection. No midline shift. No pathologic intracranial enhancement identified. Vascular: Maintained flow voids within the proximal large arterial vessels. Skull and upper cervical spine: No focal worrisome marrow lesion. Incompletely assessed cervical spondylosis. Sinuses/Orbits: No mass or acute finding within the imaged orbits. Prior bilateral ocular lens replacement. No significant paranasal sinus disease. IMPRESSION: 1.  No evidence of an acute intracranial abnormality. 2. Moderate cerebral white matter chronic small vessel ischemic disease, similar to the prior brain MRI of 03/25/2019. 3. Small chronic lacunar infarct within the left thalamus, new from the prior MRI. 4. Moderate generalized cerebral atrophy 5. Mild cerebellar atrophy. Electronically Signed   By: Bascom Lily D.O.   On: 07/22/2023 17:23

## 2023-08-17 ENCOUNTER — Other Ambulatory Visit

## 2023-08-17 ENCOUNTER — Encounter: Payer: Self-pay | Admitting: Family Medicine

## 2023-08-17 ENCOUNTER — Inpatient Hospital Stay: Attending: Hematology | Admitting: Hematology

## 2023-08-17 VITALS — BP 132/68 | HR 105 | Temp 98.4°F | Resp 20 | Wt 145.5 lb

## 2023-08-17 DIAGNOSIS — D539 Nutritional anemia, unspecified: Secondary | ICD-10-CM | POA: Insufficient documentation

## 2023-08-17 DIAGNOSIS — F17210 Nicotine dependence, cigarettes, uncomplicated: Secondary | ICD-10-CM | POA: Insufficient documentation

## 2023-08-17 DIAGNOSIS — C771 Secondary and unspecified malignant neoplasm of intrathoracic lymph nodes: Secondary | ICD-10-CM | POA: Diagnosis not present

## 2023-08-17 DIAGNOSIS — C3492 Malignant neoplasm of unspecified part of left bronchus or lung: Secondary | ICD-10-CM | POA: Insufficient documentation

## 2023-08-17 DIAGNOSIS — E1142 Type 2 diabetes mellitus with diabetic polyneuropathy: Secondary | ICD-10-CM | POA: Diagnosis not present

## 2023-08-17 DIAGNOSIS — Z7984 Long term (current) use of oral hypoglycemic drugs: Secondary | ICD-10-CM | POA: Diagnosis not present

## 2023-08-17 DIAGNOSIS — C3431 Malignant neoplasm of lower lobe, right bronchus or lung: Secondary | ICD-10-CM | POA: Insufficient documentation

## 2023-08-17 DIAGNOSIS — Z794 Long term (current) use of insulin: Secondary | ICD-10-CM | POA: Diagnosis not present

## 2023-08-17 DIAGNOSIS — C3412 Malignant neoplasm of upper lobe, left bronchus or lung: Secondary | ICD-10-CM | POA: Insufficient documentation

## 2023-08-17 DIAGNOSIS — E1165 Type 2 diabetes mellitus with hyperglycemia: Secondary | ICD-10-CM | POA: Diagnosis not present

## 2023-08-17 NOTE — Patient Instructions (Signed)
 [facility] at [facility] Discharge Instructions   You were seen and examined today by Dr. Cheree Cords.  He discussed with you that you have cancer in 3 different spots. Because of this they are not interested in treating with radiation.   The special blood test we sent to look for mutations to target. It did not show any mutations to target. The special testing results we sent on your biopsy are pending. This will help us  determine what treatment to give.   Return as scheduled.    Thank you for choosing [facility] at [facility] to provide your oncology and hematology care.  To afford each patient quality time with our provider, please arrive at least 15 minutes before your scheduled appointment time.   If you have a lab appointment with the Cancer Center please come in thru the Main Entrance and check in at the main information desk.  You need to re-schedule your appointment should you arrive 10 or more minutes late.  We strive to give you quality time with our providers, and arriving late affects you and other patients whose appointments are after yours.  Also, if you no show three or more times for appointments you may be dismissed from the clinic at the providers discretion.     Again, thank you for choosing [facility].  Our hope is that these requests will decrease the amount of time that you wait before being seen by our physicians.       _____________________________________________________________  Should you have questions after your visit to [facility], please contact our office at [phone] and follow the prompts.  Our office hours are 8:00 a.m. and 4:30 p.m. Monday - Friday.  Please note that voicemails left after 4:00 p.m. may not be returned until the following business day.  We are closed weekends and major holidays.  You do have access to a nurse 24-7, just call the main number to the clinic  [phone] and do not press any options, hold on the line and a nurse will answer the phone.    For prescription refill requests, have your pharmacy contact our office and allow 72 hours.    Due to Covid, you will need to wear a mask upon entering the hospital. If you do not have a mask, a mask will be given to you at the Main Entrance upon arrival. For doctor visits, patients may have 1 support person age 41 or older with them. For treatment visits, patients can not have anyone with them due to social distancing guidelines and our immunocompromised population.

## 2023-08-18 ENCOUNTER — Telehealth: Payer: Self-pay | Admitting: Family

## 2023-08-18 ENCOUNTER — Encounter (HOSPITAL_COMMUNITY): Payer: Self-pay

## 2023-08-18 ENCOUNTER — Ambulatory Visit: Payer: Self-pay

## 2023-08-18 LAB — T4, FREE: Free T4: 1.44 ng/dL (ref 0.82–1.77)

## 2023-08-18 LAB — TSH: TSH: 1.46 u[IU]/mL (ref 0.450–4.500)

## 2023-08-18 NOTE — Telephone Encounter (Signed)
 Will fill out PCS form for pt and add the list agency below on form.

## 2023-08-18 NOTE — Telephone Encounter (Unsigned)
 Copied from CRM [id number]. Topic: General - Other >> Aug 18, 2023  9:30 AM Juluis Ok wrote: Reason for CRM: Patient states that he needs a PCS form sent to the following address: [employer], [facility] [address] [place], [place] [address] Phone # [phone]

## 2023-08-19 ENCOUNTER — Inpatient Hospital Stay: Admitting: Hematology

## 2023-08-19 ENCOUNTER — Other Ambulatory Visit: Payer: Self-pay

## 2023-08-19 MED ORDER — SPIRONOLACTONE 25 MG PO TABS: 25.0000 mg | ORAL_TABLET | Freq: Two times a day (BID) | ORAL | 3 refills | Status: DC

## 2023-08-19 MED ORDER — DILTIAZEM HCL ER COATED BEADS 120 MG PO CP24: 120.0000 mg | ORAL_CAPSULE | Freq: Every day | ORAL | 3 refills | Status: DC | PRN

## 2023-08-21 ENCOUNTER — Telehealth: Payer: Self-pay | Admitting: Cardiovascular Disease

## 2023-08-21 MED ORDER — BISOPROLOL FUMARATE 5 MG PO TABS: 5.0000 mg | ORAL_TABLET | Freq: Every day | ORAL | 2 refills | Status: DC

## 2023-08-21 NOTE — Telephone Encounter (Signed)
 PCD form faxed.

## 2023-08-21 NOTE — Telephone Encounter (Signed)
 Pt's medication was sent to pt's pharmacy as requested. Confirmation received.

## 2023-08-21 NOTE — Telephone Encounter (Signed)
*  STAT* If patient is at the pharmacy, call can be transferred to refill team.   1. Which medications need to be refilled? (please list name of each medication and dose if known) Bisoprolol    2. Would you like to learn more about the convenience, safety, & potential cost savings by using the [facility]?      3. Are you open to using the [facility] (Type [facility].    4. Which pharmacy/location (including street and city if local pharmacy) is medication to be sent to?[facility]  5. Do they need a 30 day or 90 day supply? 90 days and refills

## 2023-08-24 ENCOUNTER — Encounter: Payer: Self-pay | Admitting: Nurse Practitioner

## 2023-08-24 ENCOUNTER — Ambulatory Visit (INDEPENDENT_AMBULATORY_CARE_PROVIDER_SITE_OTHER): Admitting: Nurse Practitioner

## 2023-08-24 VITALS — BP 130/70 | HR 107 | Ht 66.0 in | Wt 152.8 lb

## 2023-08-24 DIAGNOSIS — E1165 Type 2 diabetes mellitus with hyperglycemia: Secondary | ICD-10-CM

## 2023-08-24 DIAGNOSIS — E1142 Type 2 diabetes mellitus with diabetic polyneuropathy: Secondary | ICD-10-CM

## 2023-08-24 DIAGNOSIS — Z7984 Long term (current) use of oral hypoglycemic drugs: Secondary | ICD-10-CM | POA: Diagnosis not present

## 2023-08-24 DIAGNOSIS — E782 Mixed hyperlipidemia: Secondary | ICD-10-CM | POA: Diagnosis not present

## 2023-08-24 DIAGNOSIS — E039 Hypothyroidism, unspecified: Secondary | ICD-10-CM

## 2023-08-24 DIAGNOSIS — Z794 Long term (current) use of insulin: Secondary | ICD-10-CM

## 2023-08-24 LAB — POCT GLYCOSYLATED HEMOGLOBIN (HGB A1C): Hemoglobin A1C: 7.9 % — AB (ref 4.0–5.6)

## 2023-08-24 MED ORDER — TRESIBA FLEXTOUCH 100 UNIT/ML ~~LOC~~ SOPN: 20.0000 [IU] | PEN_INJECTOR | Freq: Every day | SUBCUTANEOUS | 1 refills | Status: DC

## 2023-08-24 MED ORDER — BD PEN NEEDLE NANO U/F 32G X 4 MM MISC: 3 refills | Status: DC

## 2023-08-24 MED ORDER — LEVOTHYROXINE SODIUM 50 MCG PO TABS: 50.0000 ug | ORAL_TABLET | Freq: Every day | ORAL | 1 refills | Status: DC

## 2023-08-24 NOTE — Progress Notes (Signed)
 Endocrinology Follow Up Note       08/24/2023, 9:39 AM   Subjective:    Patient ID: John [name], male    DOB: [birth year].  John [name] is being seen in follow up after being seen in consultation for management of currently uncontrolled symptomatic diabetes requested by  Yevette Hem, FNP.   Past Medical History:  Diagnosis Date   Adenocarcinoma of lung, right (HCC) 04/18/2016   Anxiety    Arthritis    Asthma    Atrial flutter (HCC)    BPH (benign prostatic hyperplasia)    with urinary retention 02/06/20   CHF (congestive heart failure) (HCC)    COPD (chronic obstructive pulmonary disease) (HCC)    Depression    Diabetes mellitus, type II (HCC)    Dyspnea    GERD (gastroesophageal reflux disease)    History of blood transfusion    History of kidney stones    History of radiation therapy    right lung 09/10/2020-09/17/2020   Dr Eloise Hake   History of radiation therapy    Left and right Lung- 06/19/22-06/26/22   Hyperlipidemia    Hypertension    Hypothyroidism    IDA (iron  deficiency anemia)    Macular degeneration    Neuropathy    hands and feet   Non-small cell lung cancer, right (HCC) 04/18/2016   Oxygen  dependent    uses 2L oxygen  as needed   PAD (peripheral artery disease) (HCC) 03/2023   managed by Dr Jimmye Moulds   Peripheral vascular disease [facility])    Prostatitis    Pulmonary nodule, left 07/16/2016   Sleep apnea 2019   uses CPAP nightly    Past Surgical History:  Procedure Laterality Date   A-FLUTTER ABLATION N/A 01/13/2022   Procedure: A-FLUTTER ABLATION;  Surgeon: Boyce Byes, MD;  Location: [facility] INVASIVE CV LAB;  Service: Cardiovascular;  Laterality: N/A;   ABDOMINAL AORTOGRAM W/LOWER EXTREMITY Left 02/06/2020   Procedure: ABDOMINAL AORTOGRAM W/LOWER EXTREMITY;  Surgeon: Avanell Leigh, MD;  Location: [facility] INVASIVE CV LAB;  Service: Cardiovascular;  Laterality:  Left;   ABDOMINAL AORTOGRAM W/LOWER EXTREMITY N/A 05/30/2021   Procedure: ABDOMINAL AORTOGRAM W/LOWER EXTREMITY;  Surgeon: Young Hensen, MD;  Location: [facility] INVASIVE CV LAB;  Service: Cardiovascular;  Laterality: N/A;   ABDOMINAL AORTOGRAM W/LOWER EXTREMITY N/A 02/05/2023   Procedure: ABDOMINAL AORTOGRAM W/LOWER EXTREMITY;  Surgeon: Young Hensen, MD;  Location: [facility] INVASIVE CV LAB;  Service: Cardiovascular;  Laterality: N/A;   BIOPSY  09/16/2021   Procedure: BIOPSY;  Surgeon: Kenney Peacemaker, MD;  Location: [facility] ENDOSCOPY;  Service: Gastroenterology;;   BRONCHIAL BIOPSY  08/03/2023   Procedure: BRONCHOSCOPY, WITH BIOPSY;  Surgeon: Denson Flake, MD;  Location: [facility] ENDOSCOPY;  Service: Pulmonary;;   BRONCHIAL BRUSHINGS  08/03/2023   Procedure: BRONCHOSCOPY, WITH BRUSH BIOPSY;  Surgeon: Denson Flake, MD;  Location: [facility] ENDOSCOPY;  Service: Pulmonary;;   BRONCHIAL NEEDLE ASPIRATION BIOPSY  08/03/2023   Procedure: BRONCHOSCOPY, WITH NEEDLE ASPIRATION BIOPSY;  Surgeon: Denson Flake, MD;  Location: [facility] ENDOSCOPY;  Service: Pulmonary;;   CARDIOVERSION N/A 10/05/2020   Procedure: CARDIOVERSION;  Surgeon: Harrold Lincoln, MD;  Location: [facility] ENDOSCOPY;  Service: Cardiovascular;  Laterality: N/A;   CATARACT EXTRACTION, BILATERAL Bilateral    COLONOSCOPY WITH PROPOFOL  N/A 06/20/2021   Procedure: COLONOSCOPY WITH PROPOFOL ;  Surgeon: Tobin Forts, MD;  Location: [facility] ENDOSCOPY;  Service: Endoscopy;  Laterality: N/A;   COLONOSCOPY WITH PROPOFOL  N/A 09/16/2021   Procedure: COLONOSCOPY WITH PROPOFOL ;  Surgeon: Kenney Peacemaker, MD;  Location: [facility] ENDOSCOPY;  Service: Gastroenterology;  Laterality: N/A;   ENDARTERECTOMY FEMORAL Right 08/20/2020   Procedure: ENDARTERECTOMY  RIGHT FEMORAL ARTERY;  Surgeon: Young Hensen, MD;  Location: [facility] OR;  Service: Vascular;  Laterality: Right;   ENTEROSCOPY N/A 10/11/2021   Procedure: ENTEROSCOPY;  Surgeon: Urban Garden, MD;  Location: [facility]  ENDO SUITE;  Service: Gastroenterology;  Laterality: N/A;   ENTEROSCOPY N/A 02/18/2023   Procedure: ENTEROSCOPY;  Surgeon: Suzette Espy, MD;  Location: [facility] ENDO SUITE;  Service: Endoscopy;  Laterality: N/A;  EGD with possible small bowel push enteroscopy   ESOPHAGOGASTRODUODENOSCOPY N/A 09/16/2021   Procedure: ESOPHAGOGASTRODUODENOSCOPY (EGD);  Surgeon: Kenney Peacemaker, MD;  Location: [facility] ENDOSCOPY;  Service: Gastroenterology;  Laterality: N/A;   ESOPHAGOGASTRODUODENOSCOPY (EGD) WITH PROPOFOL  N/A 09/06/2020   Procedure: ESOPHAGOGASTRODUODENOSCOPY (EGD) WITH PROPOFOL ;  Surgeon: Tobin Forts, MD;  Location: [facility] ENDOSCOPY;  Service: Endoscopy;  Laterality: N/A;   ESOPHAGOGASTRODUODENOSCOPY (EGD) WITH PROPOFOL  N/A 10/11/2021   Procedure: ESOPHAGOGASTRODUODENOSCOPY (EGD) WITH PROPOFOL ;  Surgeon: Urban Garden, MD;  Location: [facility] ENDO SUITE;  Service: Gastroenterology;  Laterality: N/A;   ESOPHAGOGASTRODUODENOSCOPY (EGD) WITH PROPOFOL  N/A 02/18/2023   Procedure: ESOPHAGOGASTRODUODENOSCOPY (EGD) WITH PROPOFOL ;  Surgeon: Suzette Espy, MD;  Location: [facility] ENDO SUITE;  Service: Endoscopy;  Laterality: N/A;   GIVENS CAPSULE STUDY N/A 02/19/2023   Procedure: GIVENS CAPSULE STUDY;  Surgeon: Hargis Lias, MD;  Location: [facility] ENDO SUITE;  Service: Endoscopy;  Laterality: N/A;   HEMOSTASIS CLIP PLACEMENT  09/16/2021   Procedure: HEMOSTASIS CLIP PLACEMENT;  Surgeon: Kenney Peacemaker, MD;  Location: [facility] ENDOSCOPY;  Service: Gastroenterology;;   HOT HEMOSTASIS N/A 09/16/2021   Procedure: HOT HEMOSTASIS (ARGON PLASMA COAGULATION/BICAP);  Surgeon: Kenney Peacemaker, MD;  Location: [facility] ENDOSCOPY;  Service: Gastroenterology;  Laterality: N/A;   HOT HEMOSTASIS  10/11/2021   Procedure: HOT HEMOSTASIS (ARGON PLASMA COAGULATION/BICAP);  Surgeon: Umberto Ganong, Bearl Limes, MD;  Location: [facility] ENDO SUITE;  Service: Gastroenterology;;   HOT HEMOSTASIS  02/18/2023   Procedure: HOT HEMOSTASIS (ARGON PLASMA  COAGULATION/BICAP);  Surgeon: Suzette Espy, MD;  Location: [facility] ENDO SUITE;  Service: Endoscopy;;   INSERTION OF ILIAC STENT Right 08/20/2020   Procedure: RETROGRADE INSERTION OF RIGHT ILIAC STENT;  Surgeon: Young Hensen, MD;  Location: [facility] OR;  Service: Vascular;  Laterality: Right;   INTRAOPERATIVE ARTERIOGRAM Right 08/20/2020   Procedure: INTRA OPERATIVE ARTERIOGRAM ILIAC;  Surgeon: Young Hensen, MD;  Location: [facility] OR;  Service: Vascular;  Laterality: Right;   PATCH ANGIOPLASTY Right 08/20/2020   Procedure: PATCH ANGIOPLASTY RIGHT FEMORAL ARTERY;  Surgeon: Young Hensen, MD;  Location: [facility] OR;  Service: Vascular;  Laterality: Right;   PERIPHERAL VASCULAR BALLOON ANGIOPLASTY Right 05/30/2021   Procedure: PERIPHERAL VASCULAR BALLOON ANGIOPLASTY;  Surgeon: Young Hensen, MD;  [facility]: [facility] INVASIVE CV LAB;  Service: Cardiovascular;  Laterality: Right;   PERIPHERAL VASCULAR INTERVENTION Right 02/05/2023   Procedure: PERIPHERAL VASCULAR INTERVENTION;  Surgeon: Young Hensen, MD;  Location: [facility] INVASIVE CV LAB;  Service: Cardiovascular;  Laterality: Right;  Rt SFA and Rt Ext Iliac   PORTACATH PLACEMENT Left 06/13/2016   Procedure: INSERTION PORT-A-CATH;  Surgeon: Alanda Allegra, MD;  Location: [facility] ORS;  Service: General;  Laterality: Left;   TRANSURETHRAL RESECTION OF PROSTATE N/A 05/31/2020   Procedure: TRANSURETHRAL RESECTION OF THE PROSTATE (TURP);  Surgeon: Marco Severs, MD;  Location: [facility] ORS;  Service: Urology;  Laterality: N/A;   VIDEO BRONCHOSCOPY WITH ENDOBRONCHIAL NAVIGATION N/A 05/28/2016   Procedure: VIDEO BRONCHOSCOPY WITH ENDOBRONCHIAL NAVIGATION;  Surgeon: Zelphia Higashi, MD;  Location: [facility] OR;  Service: Thoracic;  Laterality: N/A;   VIDEO BRONCHOSCOPY WITH ENDOBRONCHIAL NAVIGATION Bilateral 08/03/2023   Procedure: VIDEO BRONCHOSCOPY WITH ENDOBRONCHIAL NAVIGATION;  Surgeon: Denson Flake, MD;  Location: [facility] ENDOSCOPY;  Service: Pulmonary;   Laterality: Bilateral;  WITH FLUORO   VIDEO BRONCHOSCOPY WITH ENDOBRONCHIAL ULTRASOUND N/A 05/28/2016   Procedure: VIDEO BRONCHOSCOPY WITH ENDOBRONCHIAL ULTRASOUND;  Surgeon: Zelphia Higashi, MD;  Location: [facility] OR;  Service: Thoracic;  Laterality: N/A;    Social History   Socioeconomic History   Marital status: Significant Other    Spouse name: Not on file   Number of children: 5   Years of education: 10   Highest education level: 10th grade  Occupational History   Occupation: Retired  Tobacco Use   Smoking status: Every Day    Current packs/day: 0.50    Average packs/day: 0.5 packs/day for 62.5 years (31.2 ttl pk-yrs)    Types: Cigarettes    Start date: 03/11/1961   Smokeless tobacco: Never   Tobacco comments:    1/2 pack to 1 pack per day- 07/30/2023, informed to not smoke 24 hrs prior to procedure.  Vaping Use   Vaping status: Never Used  Substance and Sexual Activity   Alcohol use: No   Drug use: No   Sexual activity: Yes    Birth control/protection: None  Other Topics Concern   Not on file  Social History Narrative   John [name] is his POA and lives with him. Children out of town - [place] in [place] [place], others in [place].   Social Drivers of Corporate investment banker Strain: Low Risk  (08/06/2023)   Received from [facility]   Overall Financial Resource Strain (CARDIA)    Difficulty of Paying Living Expenses: Not hard at all  Food Insecurity: No Food Insecurity (08/10/2023)   Hunger Vital Sign    Worried About Running Out of Food in the Last Year: Never true    Ran Out of Food in the Last Year: Never true  Transportation Needs: No Transportation Needs (08/10/2023)   PRAPARE - Administrator, Civil Service (Medical): No    Lack of Transportation (Non-Medical): No  Physical Activity: Insufficiently Active (05/30/2023)   Exercise Vital Sign    Days of Exercise per Week: 3 days    Minutes of Exercise per Session: 10 min  Stress: No Stress Concern Present  (05/30/2023)   [employer] of Occupational Health - Occupational Stress Questionnaire    Feeling of Stress : Not at all  Social Connections: Moderately Isolated (05/30/2023)   Social Connection and Isolation Panel [NHANES]    Frequency of Communication with Friends and Family: Twice a week    Frequency of Social Gatherings with Friends and Family: Twice a week    Attends Religious Services: Never    Database administrator or Organizations: No    Attends Banker Meetings: Never    Marital Status: Married    Family History  Problem Relation Age of Onset   Hypertension Mother    Diabetes Father  Heart disease Father    Stroke Father    Hypertension Sister    Sleep apnea Neg Hx     Outpatient Encounter Medications as of 08/24/2023  Medication Sig   albuterol  (VENTOLIN  HFA) 108 (90 Base) MCG/ACT inhaler INHALE 2 PUFFS INTO THE LUNGS EVERY 6 HOURS AS NEEDED FOR WHEEZE OR SHORTNESS OF BREATH   allopurinol  (ZYLOPRIM ) 300 MG tablet Take 1 tablet (300 mg total) by mouth daily.   bisoprolol  (ZEBETA ) 5 MG tablet Take 1 tablet (5 mg total) by mouth daily.   Blood Glucose Monitoring Suppl (ONETOUCH VERIO REFLECT) w/Device KIT Use to test blood sugars daily as directed. DX: E11.9   clopidogrel  (PLAVIX ) 75 MG tablet Take 1 tablet (75 mg total) by mouth daily.   diltiazem  (CARDIZEM  CD) 120 MG 24 hr capsule Take 1 capsule (120 mg total) by mouth daily as needed.   docusate sodium  (COLACE) 100 MG capsule Take 1 capsule (100 mg total) by mouth daily as needed for mild constipation.   dutasteride  (AVODART ) 0.5 MG capsule Take 1 capsule (0.5 mg total) by mouth daily.   famotidine  (PEPCID ) 20 MG tablet Take 1 tablet (20 mg total) by mouth daily.   ferrous sulfate  325 (65 FE) MG EC tablet Take 1 tablet (325 mg total) by mouth daily.   Fluticasone -Umeclidin-Vilant (TRELEGY ELLIPTA ) 100-62.5-25 MCG/ACT AEPB Inhale 1 puff into the lungs daily.   gabapentin  (NEURONTIN ) 300 MG capsule Take  1-2 capsules (300-600 mg total) by mouth 2 (two) times daily. Take 300mg  (1 cap) in the morning and 600mg  (2 caps) in the evening for a total daily dose of 900mg .   linaclotide  (LINZESS ) 72 MCG capsule Take 1 capsule (72 mcg total) by mouth daily before breakfast.   megestrol  (MEGACE ) 400 MG/10ML suspension Take 10 mLs (400 mg total) by mouth 2 (two) times daily.   metolazone  (ZAROXOLYN ) 2.5 MG tablet Take 2.5 mg by mouth 3 (three) times a week.   NON FORMULARY CPAP at bedtime   ondansetron  (ZOFRAN ) 4 MG tablet Take 1 tablet (4 mg total) by mouth every 8 (eight) hours as needed for nausea or vomiting.   OneTouch Delica Lancets 30G MISC Use to test blood sugars daily as directed. DX: E11.9   OXYGEN  Inhale 2 L into the lungs daily as needed (with Cpap at night).   pantoprazole  (PROTONIX ) 40 MG tablet Take 1 tablet (40 mg total) by mouth daily.   pravastatin  (PRAVACHOL ) 40 MG tablet TAKE 1 TABLET (40 MG TOTAL) BY MOUTH ON MONDAYS , WEDNESDAYS, AND FRIDAYS AS DIRECTED   propranolol  (INDERAL ) 10 MG tablet Take 1 tablet (10 mg total) by mouth 3 (three) times daily as needed.   spironolactone  (ALDACTONE ) 25 MG tablet Take 1 tablet (25 mg total) by mouth 2 (two) times daily.   torsemide  (DEMADEX ) 20 MG tablet Take 0.5 tablets (10 mg total) by mouth daily.   zolpidem  (AMBIEN ) 5 MG tablet Take 1 tablet (5 mg total) by mouth at bedtime as needed for sleep.   [DISCONTINUED] insulin  degludec (TRESIBA  FLEXTOUCH) 100 UNIT/ML FlexTouch Pen Inject 20 Units into the skin at bedtime.   [DISCONTINUED] Insulin  Degludec FlexTouch 200 UNIT/ML SOPN Inject 16 units Tresiba  subcutaneus at bedtime as directed by the provider   [DISCONTINUED] Insulin  Pen Needle (BD PEN NEEDLE NANO U/F) 32G X 4 MM MISC Use to inject insulin  once daily   [DISCONTINUED] levothyroxine  (SYNTHROID ) 50 MCG tablet Take 1 tablet (50 mcg total) by mouth daily.   Continuous Glucose Receiver (FREESTYLE Fenton  3 READER) DEVI 1 Device by Does not apply  route every 14 (fourteen) days. (Patient not taking: Reported on 08/24/2023)   Continuous Glucose Sensor (FREESTYLE LIBRE 3 SENSOR) MISC 1 Device by Does not apply route continuous. (Patient not taking: Reported on 08/24/2023)   insulin  degludec (TRESIBA  FLEXTOUCH) 100 UNIT/ML FlexTouch Pen Inject 20 Units into the skin at bedtime.   Insulin  Pen Needle (BD PEN NEEDLE NANO U/F) 32G X 4 MM MISC Use to inject insulin  once daily   levothyroxine  (SYNTHROID ) 50 MCG tablet Take 1 tablet (50 mcg total) by mouth daily.   [DISCONTINUED] bisoprolol  (ZEBETA ) 5 MG tablet Take 5 mg by mouth daily.   [DISCONTINUED] levothyroxine  (SYNTHROID ) 50 MCG tablet Take 1 tablet (50 mcg total) by mouth daily.   No facility-administered encounter medications on file as of 08/24/2023.    ALLERGIES: Allergies  Allergen Reactions   Bisoprolol  Other (See Comments)    Confusion, delirium    Sulfa  Antibiotics Swelling    Mouth swelling   Atorvastatin Other (See Comments)    Muscle aches - tolerating Pravastatin  40 mg MWF   Jardiance  [Empagliflozin ] Other (See Comments)    FEELS SLUGGISH, TIRED   Lopressor  [Metoprolol ] Other (See Comments)    Fatigue   Rosuvastatin Other (See Comments)    Muscle aches - tolerating Pravastatin  40 mg MWF   Doxycycline  Nausea Only   Temazepam  Other (See Comments)    Made insomnia worse    Tramadol  Itching    VACCINATION STATUS: Immunization History  Administered Date(s) Administered   Fluad Quad(high Dose 65+) 01/19/2019, 02/01/2020, 01/23/2021, 01/28/2022   Fluad Trivalent(High Dose 65+) 12/26/2022   Influenza, High Dose Seasonal PF 12/28/2015   Influenza,inj,Quad PF,6+ Mos 01/23/2017   Moderna Covid-19 Fall Seasonal Vaccine 74yrs & older 03/28/2022   Moderna Sars-Covid-2 Vaccination 05/10/2019, 06/07/2019, 01/31/2020   Pneumococcal Conjugate-13 06/17/2017   Pneumococcal Polysaccharide-23 04/25/2019   Respiratory Syncytial Virus Vaccine,Recomb Aduvanted(Arexvy) 03/28/2022    Tdap 05/17/2019   Zoster Recombinant(Shingrix ) 11/25/2021, 02/12/2022   Zoster, Live 07/10/2014    Diabetes He presents for his follow-up diabetic visit. He has type 2 diabetes mellitus. Onset time: Was diagnosed at approx age of 37. His disease course has been improving. There are no hypoglycemic associated symptoms. Pertinent negatives for hypoglycemia include no nervousness/anxiousness or tremors. Associated symptoms include blurred vision and foot paresthesias. Pertinent negatives for diabetes include no fatigue, no polydipsia, no polyphagia and no weight loss. There are no hypoglycemic complications. Symptoms are stable. Diabetic complications include heart disease, nephropathy, peripheral neuropathy and PVD. Risk factors for coronary artery disease include diabetes mellitus, dyslipidemia, hypertension, male sex, sedentary lifestyle and tobacco exposure. Current diabetic treatment includes insulin  injections. He is compliant with treatment most of the time. His weight is increasing steadily (recently put on appetite stimulant). He is following a generally healthy diet. When asked about meal planning, he reported none. He has not had a previous visit with a dietitian. He rarely participates in exercise. His home blood glucose trend is decreasing steadily. His overall blood glucose range is 180-200 mg/dl. (He presents today, accompanied by his wife, with his CGM showing mostly at target glycemic profile overall.  His POC A1c today was 7.9%, essentially unchanged from last visit.  He notes he did decrease his Tresiba  to 18 units as his glucose was trending downwards after he finished his course or oral steroids.   Analysis of his CGM [name] TIR 51%, TAR 49%, TBR 0% with a GMI of 7.7%.) An ACE inhibitor/angiotensin II  receptor blocker is being taken. He sees a podiatrist.Eye exam is current.  Hyperlipidemia This is a chronic problem. The current episode started more than 1 year ago. The problem is  controlled. Recent lipid tests were reviewed and are normal. Exacerbating diseases include chronic renal disease, diabetes and hypothyroidism. Factors aggravating his hyperlipidemia include fatty foods and beta blockers. Current antihyperlipidemic treatment includes statins. The current treatment provides moderate improvement of lipids. There are no compliance problems.  Risk factors for coronary artery disease include diabetes mellitus, dyslipidemia, hypertension, male sex and a sedentary lifestyle.  Hypertension This is a chronic problem. The current episode started more than 1 year ago. The problem has been gradually improving since onset. The problem is controlled. Associated symptoms include blurred vision and palpitations. Agents associated with hypertension include thyroid  hormones. Risk factors for coronary artery disease include diabetes mellitus, dyslipidemia, sedentary lifestyle, smoking/tobacco exposure and male gender. Past treatments include angiotensin blockers, diuretics, beta blockers and alpha 1 blockers. The current treatment provides moderate improvement. There are no compliance problems.  Hypertensive end-organ damage includes kidney disease, CAD/MI and PVD. Identifiable causes of hypertension include chronic renal disease, sleep apnea and a thyroid  problem.  Thyroid  Problem Presents for follow-up visit. Symptoms include palpitations. Patient reports no anxiety, cold intolerance, constipation, depressed mood, diarrhea, dry skin, fatigue, heat intolerance, tremors or weight loss. (Having cardioversion on 7/1 for Afib) The symptoms have been stable. His past medical history is significant for diabetes.     Review of systems  Constitutional: + steadily increasing body weight- recently started on appetite stimulant,  current Body mass index is 24.66 kg/m. , + fatigue, no subjective hyperthermia, no subjective hypothermia Eyes: no blurry vision, no xerophthalmia ENT: no sore throat, no  nodules palpated in throat, no dysphagia/odynophagia, no hoarseness Cardiovascular: no chest pain, no shortness of breath, no palpitations, no leg swelling Respiratory: no cough, + shortness of breath- starting chemo soon for lung cancer Gastrointestinal: no nausea/vomiting/diarrhea; constipation-stable Musculoskeletal: no muscle/joint aches Skin: no rashes, no hyperemia Neurological: no tremors, + numbness/tingling to BLE, + dizziness with standing- intermittent Psychiatric: no depression, no anxiety  Objective:     BP 130/70 (BP Location: Left Arm, Patient Position: Sitting, Cuff Size: Large)   Pulse (!) 107   Ht 5\' 6"  (1.676 m)   Wt 152 lb 12.8 oz (69.3 kg)   BMI 24.66 kg/m   Wt Readings from Last 3 Encounters:  08/24/23 152 lb 12.8 oz (69.3 kg)  08/17/23 145 lb 8.1 oz (66 kg)  08/12/23 149 lb (67.6 kg)     BP Readings from Last 3 Encounters:  08/24/23 130/70  08/17/23 132/68  08/12/23 (!) 154/76      Physical Exam- Limited  Constitutional:  Body mass index is 24.66 kg/m. , not in acute distress, normal state of mind Eyes:  EOMI, no exophthalmos Musculoskeletal: no gross deformities, strength intact in all four extremities, no gross restriction of joint movements Skin:  no rashes, no hyperemia, nicotinic discoloration to finger tips bilaterally Neurological: no tremor with outstretched hands    Diabetic Foot Exam - Simple   No data filed     CMP ( most recent) CMP     Component Value Date/Time   NA 138 08/12/2023 1230   K 3.9 08/12/2023 1230   CL 98 08/12/2023 1230   CO2 25 08/12/2023 1230   GLUCOSE 99 08/12/2023 1230   GLUCOSE 179 (H) 06/29/2023 1521   BUN 14 08/12/2023 1230   CREATININE 0.80 08/12/2023 1230  CALCIUM 8.9 08/12/2023 1230   PROT 6.2 08/12/2023 1230   ALBUMIN 3.7 (L) 08/12/2023 1230   AST 12 08/12/2023 1230   ALT 19 08/12/2023 1230   ALKPHOS 122 (H) 08/12/2023 1230   BILITOT 0.4 08/12/2023 1230   GFRNONAA >60 06/29/2023 1521    GFRAA 102 05/24/2020 1238     Diabetic Labs (most recent): Lab Results  Component Value Date   HGBA1C 7.9 (A) 08/24/2023   HGBA1C 5.8 (H) 04/30/2023   HGBA1C 7.8 (H) 01/27/2023   MICROALBUR 80 mg/L 09/23/2022   MICROALBUR 30 08/14/2021   MICROALBUR 30 09/24/2020     Lipid Panel ( most recent) Lipid Panel     Component Value Date/Time   CHOL 126 08/21/2020 0530   CHOL 136 04/25/2019 1608   TRIG 89 08/21/2020 0530   HDL 28 (L) 08/21/2020 0530   HDL 28 (L) 04/25/2019 1608   CHOLHDL 4.5 08/21/2020 0530   VLDL 18 08/21/2020 0530   LDLCALC 80 08/21/2020 0530   LDLCALC 78 04/25/2019 1608   LABVLDL 30 04/25/2019 1608      Lab Results  Component Value Date   TSH 1.460 08/17/2023   TSH 1.061 05/31/2023   TSH 1.780 01/27/2023   TSH 2.178 09/19/2022   TSH 1.960 11/25/2021   TSH 2.510 08/23/2021   TSH 2.686 04/18/2021   TSH 3.430 01/22/2021   TSH 5.033 (H) 12/17/2020   TSH 3.410 10/26/2020   FREET4 1.44 08/17/2023   FREET4 1.14 01/22/2021   FREET4 1.96 (H) 06/03/2020   FREET4 1.03 02/20/2017           Assessment & Plan:   1) Type 2 diabetes mellitus with diabetic polyneuropathy, without long-term current use of insulin  (HCC)  - John [name] has currently uncontrolled symptomatic type 2 DM since 78 years of age.  He presents today, accompanied by his wife, with his CGM showing mostly at target glycemic profile overall.  His POC A1c today was 7.9%, essentially unchanged from last visit.  He notes he did decrease his Tresiba  to 18 units as his glucose was trending downwards after he finished his course or oral steroids.   Analysis of his CGM [name] TIR 51%, TAR 49%, TBR 0% with a GMI of 7.7%.  -Recent labs reviewed.    - I had a long discussion with him about the progressive nature of diabetes and the pathology behind its complications. -his diabetes is complicated by CKD stage 2, PAD, and neuropathy and he remains at a high risk for more acute and chronic  complications which include CAD, CVA, and retinopathy. These are all discussed in detail with him.  - Nutritional counseling repeated at each appointment due to patients tendency to fall back in to old habits.  - The patient admits there is a room for improvement in their diet and drink choices. -  Suggestion is made for the patient to avoid simple carbohydrates from their diet including Cakes, Sweet Desserts / Pastries, Ice Cream, Soda (diet and regular), Sweet Tea, Candies, Chips, Cookies, Sweet Pastries, Store Bought Juices, Alcohol in Excess of 1-2 drinks a day, Artificial Sweeteners, Coffee Creamer, and "Sugar-free" Products. This will help patient to have stable blood glucose profile and potentially avoid unintended weight gain.   - I encouraged the patient to switch to unprocessed or minimally processed complex starch and increased protein intake (animal or plant source), fruits, and vegetables.   - Patient is advised to stick to a routine mealtimes to eat 3 meals a  day and avoid unnecessary snacks (to snack only to correct hypoglycemia).  - he will be scheduled with Penny Crumpton, RDN, CDE for diabetes education.  - I have approached him with the following individualized plan to manage  his diabetes and patient agrees:   -He is advised to continue Tresiba  18 units SQ nightly now that his prednisone  taper is complete.    -he is encouraged to continue monitoring blood glucose twice daily (continue using his CGM), before breakfast and before bed and to call the clinic if he has readings less than 70 or greater than 200 for 3 tests in a row.    - Specific targets for  A1c;  LDL, HDL,  and Triglycerides were discussed with the patient.  2) Blood Pressure /Hypertension:  his blood pressure is controlled to target.  Will defer any changes to cardiology or PCP.  3) Lipids/Hyperlipidemia:    Review of his recent lipid panel from 08/21/20 showed controlled  LDL at 80.  he  is advised to  continue Pravachol  40 mg daily at bedtime.  Side effects and precautions discussed with him.  4)  Weight/Diet:  his Body mass index is 24.66 kg/m.  -  he is is not a candidate for major weight loss. I discussed with him the fact that loss of 5 - 10% of his  current body weight will have the most impact on his diabetes management.  Exercise, and detailed carbohydrates information provided  -  detailed on discharge instructions.  5) Hypothyroidism: His previsit TFTs are consistent with appropriate hormone replacement.  He is advised to continue Levothyroxine  50 mcg po daily before breakfast.     - The correct intake of thyroid  hormone (Levothyroxine , Synthroid ), is on empty stomach first thing in the morning, with water , separated by at least 30 minutes from breakfast and other medications,  and separated by more than 4 hours from calcium, iron , multivitamins, acid reflux medications (PPIs).  - This medication is a life-long medication and will be needed to correct thyroid  hormone imbalances for the rest of your life.  The dose may change from time to time, based on thyroid  blood work.  - It is extremely important to be consistent taking this medication, near the same time each morning.  -AVOID TAKING PRODUCTS CONTAINING BIOTIN (commonly found in Hair, Skin, Nails vitamins) AS IT INTERFERES WITH THE VALIDITY OF THYROID  FUNCTION BLOOD TESTS.  6) Chronic Care/Health Maintenance: -he is on ACEI/ARB and Statin medications and is encouraged to initiate and continue to follow up with Ophthalmology, Dentist, Podiatrist at least yearly or according to recommendations, and advised to QUIT SMOKING. I have recommended yearly flu vaccine and pneumonia vaccine at least every 5 years; moderate intensity exercise for up to 150 minutes weekly; and sleep for at least 7 hours a day.  - he is advised to maintain close follow up with Yevette Hem, FNP for primary care needs, as well as his other providers for  optimal and coordinated care.      I spent  38  minutes in the care of the patient today including review of labs from CMP, Lipids, Thyroid  Function, Hematology (current and previous including abstractions from other facilities); face-to-face time discussing  his blood glucose readings/logs, discussing hypoglycemia and hyperglycemia episodes and symptoms, medications doses, his options of short and long term treatment based on the latest standards of care / guidelines;  discussion about incorporating lifestyle medicine;  and documenting the encounter. Risk reduction counseling performed per USPSTF  guidelines to reduce obesity and cardiovascular risk factors.     Please refer to Patient Instructions for Blood Glucose Monitoring and Insulin /Medications Dosing Guide"  in media tab for additional information. Please  also refer to " Patient Self Inventory" in the Media  tab for reviewed elements of pertinent patient history.  John [name] participated in the discussions, expressed understanding, and voiced agreement with the above plans.  All questions were answered to his satisfaction. he is encouraged to contact clinic should he have any questions or concerns prior to his return visit.   Follow up plan: - Return in about 4 months (around 12/25/2023) for Diabetes F/U with A1c in office, No previsit labs, Bring meter and logs.  Hulon Magic, [facility] [address] [place], [place] [address] Phone: [phone] Fax: [phone]  08/24/2023, 9:39 AM

## 2023-08-25 ENCOUNTER — Telehealth: Payer: Self-pay | Admitting: Family Medicine

## 2023-08-25 NOTE — Telephone Encounter (Signed)
 Copied from CRM [id number]. Topic: Clinical - Prescription Issue >> Aug 21, 2023 12:51 PM Felizardo Hotter wrote: Reason for CRM: Received call from [facility] per Pam ph: [phone], fax: [phone] requesting medications via fax which was sent on 08/19/2023. Please complete and return. >> Aug 25, 2023  1:25 PM Tisa Forester wrote: [facility] with [facility] called on friday 08/21/23 to send over patient medication script [facility] the [facility] script 08/21/23 and calling on status to see if recieved the fax call back [phone] >> Aug 21, 2023  1:47 PM Chelsea B wrote: We need a legit request form faxed to us  from insurance company when requesting any type of record on patient.

## 2023-08-25 NOTE — Telephone Encounter (Signed)
 We need authorization from pt that he is switching to this mail order pharmacy. Talked to [facility] earlier today and informed them of this.

## 2023-08-26 ENCOUNTER — Other Ambulatory Visit: Payer: Self-pay

## 2023-08-26 MED ORDER — METOLAZONE 2.5 MG PO TABS: 2.5000 mg | ORAL_TABLET | ORAL | 2 refills | Status: DC

## 2023-08-27 DIAGNOSIS — M79674 Pain in right toe(s): Secondary | ICD-10-CM | POA: Diagnosis not present

## 2023-08-27 DIAGNOSIS — L84 Corns and callosities: Secondary | ICD-10-CM | POA: Diagnosis not present

## 2023-08-27 DIAGNOSIS — B351 Tinea unguium: Secondary | ICD-10-CM | POA: Diagnosis not present

## 2023-08-27 DIAGNOSIS — E1142 Type 2 diabetes mellitus with diabetic polyneuropathy: Secondary | ICD-10-CM | POA: Diagnosis not present

## 2023-08-27 DIAGNOSIS — M79675 Pain in left toe(s): Secondary | ICD-10-CM | POA: Diagnosis not present

## 2023-08-27 NOTE — Telephone Encounter (Signed)
 We do not do this request unless we have a call from the patient that they are switching to this pharmacy. I talked to this pharmacy the other day and informed them of this, this is the 2nd call about this.

## 2023-08-28 DIAGNOSIS — C3412 Malignant neoplasm of upper lobe, left bronchus or lung: Secondary | ICD-10-CM | POA: Diagnosis not present

## 2023-08-29 DIAGNOSIS — C3412 Malignant neoplasm of upper lobe, left bronchus or lung: Secondary | ICD-10-CM | POA: Diagnosis not present

## 2023-09-01 ENCOUNTER — Other Ambulatory Visit: Payer: Self-pay | Admitting: Family

## 2023-09-01 ENCOUNTER — Inpatient Hospital Stay (HOSPITAL_BASED_OUTPATIENT_CLINIC_OR_DEPARTMENT_OTHER): Admitting: Hematology

## 2023-09-01 VITALS — BP 138/83 | HR 105 | Temp 98.3°F | Resp 19

## 2023-09-01 DIAGNOSIS — E1142 Type 2 diabetes mellitus with diabetic polyneuropathy: Secondary | ICD-10-CM

## 2023-09-01 DIAGNOSIS — F17210 Nicotine dependence, cigarettes, uncomplicated: Secondary | ICD-10-CM | POA: Diagnosis not present

## 2023-09-01 DIAGNOSIS — C3412 Malignant neoplasm of upper lobe, left bronchus or lung: Secondary | ICD-10-CM | POA: Diagnosis not present

## 2023-09-01 DIAGNOSIS — F5101 Primary insomnia: Secondary | ICD-10-CM

## 2023-09-01 DIAGNOSIS — C3492 Malignant neoplasm of unspecified part of left bronchus or lung: Secondary | ICD-10-CM | POA: Diagnosis not present

## 2023-09-01 DIAGNOSIS — C3431 Malignant neoplasm of lower lobe, right bronchus or lung: Secondary | ICD-10-CM | POA: Diagnosis not present

## 2023-09-01 DIAGNOSIS — D539 Nutritional anemia, unspecified: Secondary | ICD-10-CM | POA: Diagnosis not present

## 2023-09-01 DIAGNOSIS — C771 Secondary and unspecified malignant neoplasm of intrathoracic lymph nodes: Secondary | ICD-10-CM | POA: Diagnosis not present

## 2023-09-01 MED ORDER — MEGESTROL ACETATE 400 MG/10ML PO SUSP: 400.0000 mg | Freq: Two times a day (BID) | ORAL | 3 refills | Status: DC

## 2023-09-01 NOTE — Progress Notes (Signed)
 DISCONTINUE ON PATHWAY REGIMEN - Non-Small Cell Lung     A cycle is every 14 days:     Durvalumab    **Always confirm dose/schedule in your pharmacy ordering system**  PRIOR TREATMENT: [id number]: Durvalumab  10 mg/kg q14 Days x up to 12 Months  START OFF PATHWAY REGIMEN - Non-Small Cell Lung   [id number]:Carboplatin  IV D1,22 + Paclitaxel  IV D1,22 + Ipilimumab IV D1 + Nivolumab IV D1,22 q42 Days x 1 Cycle Followed by Ipilimumab IV D1 + Nivolumab IV D1,22 q42 Days:   Cycle 1: A cycle is 42 days:     Nivolumab      Ipilimumab      Carboplatin       Paclitaxel     Cycles 2 and beyond: A cycle is every 42 days:     Nivolumab      Ipilimumab   **Always confirm dose/schedule in your pharmacy ordering system**  Patient Characteristics: Stage IV Metastatic, Squamous, Molecular Analysis Completed, Alteration Present and Targeted Therapy Exhausted or EGFR Exon 20 Insertion or KRAS G12C or HER2 or NRG1 Present, and No Prior Chemo/Immunotherapy or No Alteration Present, PS = 0, 1, Initial  Chemotherapy/Immunotherapy, No Alteration Present, Immunotherapy Candidate, PD-L1 Expression Positive 1-49% (TPS) / Negative / Not Tested / Awaiting Test Results Therapeutic Status: Stage IV Metastatic Histology: Squamous Cell Molecular Analysis Results: No Alteration Present ECOG Performance Status: 1 Chemotherapy/Immunotherapy Line of Therapy: Initial Chemotherapy/Immunotherapy Immunotherapy Candidate Status: Candidate for Immunotherapy PD-L1 Expression Status: PD-L1 Positive 1-49% (TPS) Intent of Therapy: Non-Curative / Palliative Intent, Discussed with Patient

## 2023-09-01 NOTE — Progress Notes (Signed)
 [name] [address] [place] [address]    Clinic Day:  09/01/2023  Referring physician: Yevette Hem, FNP  Patient Care Team: Yevette Hem, FNP as PCP - General (Family Medicine) O'Neal, Cathay Clonts, MD as PCP - Cardiology (Cardiology) Boyce Byes, MD as PCP - Electrophysiology (Cardiology) Paulett Boros, MD as Consulting Physician (Hematology) Delilah Fend, [facility] (Pharmacist) O'Neal, Cathay Clonts, MD as Consulting Physician (Cardiology) McKenzie, Arden Beck, MD as Consulting Physician (Urology) Alexia Idler, OD (Optometry) Wendel Hals, NP as Nurse Practitioner (Endocrinology) Ashok Blake, DPM as Consulting Physician (Podiatry) Young Hensen, MD as Consulting Physician (Vascular Surgery)   ASSESSMENT & PLAN:   Assessment: 1.  Stage IIIa (T1b N2 cM0) NSCLC, favoring adenocarcinoma: -Chemo XRT with weekly carboplatin  and paclitaxel  completed on 08/20/2016. -Consolidation durvalumab  from 09/26/2016 through 09/25/2017. -Reviewed CT chest on 11/07/2019 which showed increasing size of the upper lobe pulmonary nodules with no new nodule in the left upper lobe suspicious for meta stasis.  Also increasing confluent soft tissue in the right suprahilar region, thought to be postradiation changes. -PET scan on 11/21/2019 showed suspected radiation changes in the right paramediastinal/perihilar region although mild hypermetabolism is present in the right retroareolar region.SUV 2.5.  While progressive soft tissue is present in this region, this hypermetabolism remains nonfocal and favors radiation changes.  7 mm central left upper lobe nodule max SUV 1.4.  9 mm aggregate nodule in the lateral right upper lobe max SUV 2.4.  These are minimally progressive from more remote prior scans.  6 mm left lower lobe nodule unchanged.  No hypermetabolic thoracic adenopathy. -CT chest on 03/23/2020 showed many of the lung nodules are stable.  Couple  of nodules have grown by few millimeters.  No new areas seen. - SBRT to the right and left lung lesions, 54 Gray in 3 fractions from 09/10/2020 through 09/17/2020. - SBRT to the RUL and LUL lesions from 06/19/2022 through 06/26/2022 by Dr. Eloise Hake. - PET scan (07/09/2023): LUL mass 3.5 x 5.1 cm with SUV 10.8.  Additional hypermetabolic nodules in the right lung both within the post radiation consolidation in the right upper lobe SUV 10.2 and within the medial right lower lobe SUV 8.4 measuring 2 x 2.4 cm.  No adenopathy. - Bronchoscopy and biopsy on 08/03/2023. - Pathology: RLL FNA squamous cell carcinoma (p40 positive and negative for TTF-1).  LUL FNA: Favor squamous cell carcinoma (positive for p40 and show patchy staining for TTF-1).  RUL endobronchial brushing [name]. - [id number] (07/25/2023): T p53 mutation.  MSI-high not detected.  TMB not evaluable. - [facility] NGS: MS-stable, TMB-high (13 Mut/Mb), PD-L1 (22 C3) TPS 10%.  Met IHC protein 3+, 90% (Telisotuzumab Vedotin for non-squamous histology), HER2 IHC (1+). - Checkmate 9 LA (carboplatin , paclitaxel , nivolumab every 3 weeks and ipilimumab every 6 weeks) started on   2.  Normocytic to macrocytic anemia: - Found to have hemoglobin 6.3 on 10/09/2020 and received 2 units PRBC while hospitalized.  I have reviewed hospitalization records. - EGD on 09/06/2020 with tiny gastric ulcer without bleeding or stigmata. - Blood loss this year most likely postoperative in nature (urological surgery in February and vascular surgery in May).    Plan: 1.  Stage IV squamous cell lung cancer: - We have reviewed results of NGS testing in detail. - He has PD-L1 TPS of 10% with no other targetable mutations.  He has met IHC protein 3+90%, but the treatment is not offered and  squamous histology.  GPS AI showed high probability of adenocarcinoma. - He ambulates with the help of walker/cane.  He has neuropathy in the feet and hands. - We discussed combination  chemoimmunotherapy with carboplatin , paclitaxel , nivolumab 360 mg every 3 weeks and ipilimumab 1 mg/kg every 6 weeks for 2 cycles followed by continuation of immunotherapy based on Checkmate 9 LA regimen.  We discussed side effects in detail.  He is agreeable.  Likely start next week.  He will be evaluated in symptom management clinic 1 week to 10 days after cycle 1.  I will see him back prior to cycle 2-day 1.   2.  Normocytic to macrocytic anemia: - Last Monoferric  was on 04/10/2023. - CBC on 08/12/2023 showed hemoglobin improved to 13.  No indication for parenteral iron  therapy.   3.  Decreased appetite/weight loss: - Continue Megace  400 mg twice daily which is helping.  4.  Peripheral neuropathy: - She has neuropathy in the feet and hands, burning type of pain secondary to diabetes.  Continue gabapentin  3 times daily.  Will closely monitor.    Orders Placed This Encounter  Procedures   CBC with Differential    Standing Status:   Future    Expected Date:   09/08/2023    Expiration Date:   09/07/2024   Comprehensive metabolic panel    Standing Status:   Future    Expected Date:   09/08/2023    Expiration Date:   09/07/2024   T4    Standing Status:   Future    Expected Date:   09/08/2023    Expiration Date:   09/07/2024   TSH    Standing Status:   Future    Expected Date:   09/08/2023    Expiration Date:   09/07/2024   CBC with Differential    Standing Status:   Future    Expected Date:   09/29/2023    Expiration Date:   09/28/2024   Comprehensive metabolic panel    Standing Status:   Future    Expected Date:   09/29/2023    Expiration Date:   09/28/2024   CBC with Differential    Standing Status:   Future    Expected Date:   10/20/2023    Expiration Date:   10/19/2024   Comprehensive metabolic panel    Standing Status:   Future    Expected Date:   10/20/2023    Expiration Date:   10/19/2024   T4    Standing Status:   Future    Expected Date:   10/20/2023    Expiration Date:   10/19/2024   TSH     Standing Status:   Future    Expected Date:   10/20/2023    Expiration Date:   10/19/2024   CBC with Differential    Standing Status:   Future    Expected Date:   11/10/2023    Expiration Date:   11/09/2024   Comprehensive metabolic panel    Standing Status:   Future    Expected Date:   11/10/2023    Expiration Date:   11/09/2024   CBC with Differential    Standing Status:   Future    Expected Date:   12/01/2023    Expiration Date:   11/30/2024   Comprehensive metabolic panel    Standing Status:   Future    Expected Date:   12/01/2023    Expiration Date:   11/30/2024   T4    Standing  Status:   Future    Expected Date:   12/01/2023    Expiration Date:   11/30/2024   TSH    Standing Status:   Future    Expected Date:   12/01/2023    Expiration Date:   11/30/2024   CBC with Differential    Standing Status:   Future    Expected Date:   12/22/2023    Expiration Date:   12/21/2024   Comprehensive metabolic panel    Standing Status:   Future    Expected Date:   12/22/2023    Expiration Date:   12/21/2024   CBC with Differential    Standing Status:   Future    Expected Date:   01/12/2024    Expiration Date:   01/11/2025   Comprehensive metabolic panel    Standing Status:   Future    Expected Date:   01/12/2024    Expiration Date:   01/11/2025   T4    Standing Status:   Future    Expected Date:   01/12/2024    Expiration Date:   01/11/2025   TSH    Standing Status:   Future    Expected Date:   01/12/2024    Expiration Date:   01/11/2025   CBC with Differential    Standing Status:   Future    Expected Date:   02/02/2024    Expiration Date:   02/01/2025   Comprehensive metabolic panel    Standing Status:   Future    Expected Date:   02/02/2024    Expiration Date:   02/01/2025      Hurman Maiden R Teague,acting as a scribe for Paulett Boros, MD.,have documented all relevant documentation on the behalf of Paulett Boros, MD,as directed by  Paulett Boros, MD while in the presence  of Paulett Boros, MD.  I, Paulett Boros MD, have reviewed the above documentation for accuracy and completeness, and I agree with the above.    Paulett Boros, MD   5/27/20252:40 PM  CHIEF COMPLAINT:   Diagnosis: right NSCLC, favor adenocarcinoma    Cancer Staging  Non-small cell lung cancer, right [facility]) Staging form: Lung, AJCC 8th Edition - Clinical stage from 06/05/2016: Stage IIIA (cT1b(2), cN2, cM0) - Signed by Doretta Gant, PA-C on 08/21/2016  Primary squamous cell carcinoma of left lung (HCC) Staging form: Lung, AJCC V9 - Clinical stage from 08/17/2023: Stage IVA (rcT2b, cN0, cM1a) - Unsigned    Prior Therapy: 1. Chemoradiation with weekly carboplatin  and paclitaxel , 06/25/16 - 08/20/16. 2. Consolidation durvalumab , 09/26/16 - 09/25/17. 3. SBRT to RUL, LUL nodules 09/10/20 - 09/17/20 4. SBRT to RUL, LUL nodules 06/19/22 - 06/26/22  Current Therapy: Under workup   HISTORY OF PRESENT ILLNESS:   Oncology History  Non-small cell lung cancer, right (HCC)  04/17/2016 Imaging   CT chest- 1. Examination is positive for bilateral upper lobe pulmonary lesions suspicious for malignancy. Further investigation with PET-CT and tissue sampling is advised. 2. Borderline enlarged paratracheal lymph nodes and right hilar nodes. Cannot rule out metastatic adenopathy. Attention to these areas on PET-CT is advised. 3. Emphysema 4. Aortic atherosclerosis and multi vessel coronary artery calcification.   04/18/2016 Initial Diagnosis   Adenocarcinoma of lung, right (HCC)   04/24/2016 PET scan   1. Adjacent hypermetabolic nodules in the RIGHT upper lobe consistent bronchogenic carcinoma. 2. Suspicion of ipsilateral hilar and paratracheal nodal metastasis. 3. Nodule in the LEFT upper lobe with suspicious morphology has a relatively low metabolic activity for  size. Favor synchronous primary bronchogenic carcinoma. 4. No metabolic activity associated with a small LEFT lower  lobe pulmonary nodule. Recommend attention on follow-up.   05/06/2016 Imaging   MRI brain- Negative for metastatic disease.   05/28/2016 Procedure   1. Electromagnetic navigational bronchoscopy with brushings, needle     aspirations and biopsies of bilateral upper lobe nodules. 2. Endobronchial ultrasound with mediastinal and hilar lymph node     aspirations. By Dr. [name]   06/03/2016 Pathology Results   Diagnosis 1. Lung, biopsy, Right upper lobe - BLOOD AND BENIGN BRONCHIAL AND LUNG TISSUE. 2. Lung, biopsy, Left upper lobe - BLOOD AND BENIGN BRONCHIAL AND LUNG TISSUE.   06/03/2016 Pathology Results   Diagnosis TRANSBRONCHIAL NEEDLE ASPIRATION, NAVIGATION, RUL (SPECIMEN 5 OF 7, COLLECTED 05/28/16): MALIGNANT CELLS CONSISTENT WITH NON-SMALL CELL CARCINOMA Comment There are limited tumor cells in the cell block (insufficient for molecular testing). TTF-1 is positive in a few of the atypical cells. They appear negative for synaptophysin and cytokeratin 5/6. Overall there is limited tumor, but a lung adenocarcinoma is slightly favored. Dr. Portia Brittle has reviewed the case. The case was called to Dr. [name] on 06/03/2016.   06/13/2016 Procedure   Port placed by Dr. Larrie Po   06/25/2016 - 08/15/2016 Chemotherapy   The patient had palonosetron  (ALOXI ) injection 0.25 mg, 0.25 mg, Intravenous,  Once, 6 of 6 cycles  CARBOplatin  (PARAPLATIN ) 220 mg in sodium chloride  0.9 % 250 mL chemo infusion, 220 mg (100 % of original dose 215.8 mg), Intravenous,  Once, 6 of 6 cycles Dose modification:   (original dose 215.8 mg, Cycle 1),   (original dose 215.8 mg, Cycle 5),   (original dose 215.8 mg, Cycle 6),   (original dose 215.8 mg, Cycle 2),   (original dose 215.8 mg, Cycle 3),   (original dose 215.8 mg, Cycle 4)  PACLitaxel  (TAXOL ) 90 mg in dextrose  5 % 250 mL chemo infusion ( for chemotherapy treatment.     06/25/2016 - 08/20/2016 Radiation Therapy   Radiation in [place], [place] by Dr. Denver Flaming.  R  lung to 6600 cGy   07/09/2016 - 07/10/2016 Hospital Admission   Admit date: 07/09/2016  Admission diagnosis: Fever and chills Additional comments: Negative work-up, discharged with course of Levaquin    07/09/2016 Treatment Plan Change   Treatment deferred x 1 week due to hospitalization    08/05/2016 - 08/06/2016 Hospital Admission   Admit date: 08/05/2016 Admission diagnosis: Joint pain Additional comments: Treated with steroids with symptomatic improvement   08/25/2016 Imaging   MRI brain- Negative for intracranial metastasis on this motion degraded examination.  Stable mild-to-moderate chronic small vessel ischemic disease.   09/18/2016 PET scan   1. Partial metabolic response. Right upper lobe 2.6 cm hypermetabolic pulmonary nodule, decreased in size and metabolism. Mildly hypermetabolic right hilar nodal metastasis, decreased in metabolism. Right paratracheal lymph node is no longer hypermetabolic. No new or progressive metastatic disease. 2. Apical left upper lobe 0.9 cm solid pulmonary nodule, slightly decreased in size, with no significant metabolism. The change in size could indicate a neoplastic etiology for this nodule. 3. Additional subcentimeter pulmonary nodules in the left lung are stable and below PET resolution . 4. Additional findings include aortic atherosclerosis, coronary atherosclerosis, moderate emphysema, stable small pericardial effusion/thickening and mild sigmoid diverticulosis.   01/13/2017 Imaging   CT chest with contrast IMPRESSION: 1. No change in size of the hypermetabolic nodular thickening in the RIGHT upper lobe. 2. LEFT upper lobe spiculated nodule is stable. 3. Rounded LEFT lower lobe  pulmonary nodule is stable. 4. No evidence of lung cancer progression.   05/08/2017 Adverse Reaction   Development of Hypothyroidism- likely secondary to immunotherapy.  Levothyroxine  therapy initiated.    Primary squamous cell carcinoma of left lung (HCC)  08/17/2023  Initial Diagnosis   Primary squamous cell carcinoma of left lung (HCC)   09/08/2023 -  Chemotherapy   Patient is on Treatment Plan : LUNG NSCLC SQUAMOUS Nivolumab + Ipilimumab + Carboplatin  + Paclitaxel  q42d X 1 cycle / Nivolumab + Ipilimumab q42d        INTERVAL HISTORY:   John [name] is a 78 y.o. male presenting to clinic today for follow up of right NSCLC, favor adenocarcinoma. He was last seen by me on 08/17/23.  Today, he states that he is doing well overall. His appetite level is at 100%. His energy level is at 25%. He is accompanied by his wife.  He denies any cancer related pains. Edyn notes decreased energy levels and is is unable to walk without assistance. He typically ambulates with a cane or walker. He would like a refill of Megace  which is effective in improving appetite.   Abu reports peripheral neuropathy secondary to diabetes in the form of a burning sensation in the hands and feet. He requires gabapentin  TID.   PAST MEDICAL HISTORY:   Past 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   Anxiety    Arthritis    Asthma    Atrial flutter (HCC)    BPH (benign prostatic hyperplasia)    with urinary retention 02/06/20   CHF (congestive heart failure) (HCC)    COPD (chronic obstructive pulmonary disease) (HCC)    Depression    Diabetes mellitus, type II (HCC)    Dyspnea    GERD (gastroesophageal reflux disease)    History of blood transfusion    History of kidney stones    History of radiation therapy    right lung 09/10/2020-09/17/2020   Dr Eloise Hake   History of radiation therapy    Left and right Lung- 06/19/22-06/26/22   Hyperlipidemia    Hypertension    Hypothyroidism    IDA (iron  deficiency anemia)    Macular degeneration    Neuropathy    hands and feet   Non-small cell lung cancer, right (HCC) 04/18/2016   Oxygen  dependent    uses 2L oxygen  as needed   PAD (peripheral artery disease) (HCC) 03/2023    managed by Dr Jimmye Moulds   Peripheral vascular disease [facility])    Prostatitis    Pulmonary nodule, left 07/16/2016   Sleep apnea 2019   uses CPAP nightly    Surgical History: Past Surgical History:  Procedure Laterality Date   A-FLUTTER ABLATION N/A 01/13/2022   Procedure: A-FLUTTER ABLATION;  Surgeon: Boyce Byes, MD;  Location: [facility] INVASIVE CV LAB;  Service: Cardiovascular;  Laterality: N/A;   ABDOMINAL AORTOGRAM W/LOWER EXTREMITY Left 02/06/2020   Procedure: ABDOMINAL AORTOGRAM W/LOWER EXTREMITY;  Surgeon: Avanell Leigh, MD;  Location: [facility] INVASIVE CV LAB;  Service: Cardiovascular;  Laterality: Left;   ABDOMINAL AORTOGRAM W/LOWER EXTREMITY N/A 05/30/2021   Procedure: ABDOMINAL AORTOGRAM W/LOWER EXTREMITY;  Surgeon: Young Hensen, MD;  Location: [facility] INVASIVE CV LAB;  Service: Cardiovascular;  Laterality: N/A;   ABDOMINAL AORTOGRAM W/LOWER EXTREMITY N/A 02/05/2023   Procedure: ABDOMINAL AORTOGRAM W/LOWER EXTREMITY;  Surgeon: Young Hensen, MD;  Location: [facility] INVASIVE CV LAB;  Service: Cardiovascular;  Laterality: N/A;   BIOPSY  09/16/2021  Procedure: BIOPSY;  Surgeon: Kenney Peacemaker, MD;  Location: [facility] ENDOSCOPY;  Service: Gastroenterology;;   BRONCHIAL BIOPSY  08/03/2023   Procedure: BRONCHOSCOPY, WITH BIOPSY;  Surgeon: Denson Flake, MD;  Location: [name] ENDOSCOPY;  Service: Pulmonary;;   BRONCHIAL BRUSHINGS  08/03/2023   Procedure: BRONCHOSCOPY, WITH BRUSH BIOPSY;  Surgeon: Denson Flake, MD;  Location: [facility] ENDOSCOPY;  Service: Pulmonary;;   BRONCHIAL NEEDLE ASPIRATION BIOPSY  08/03/2023   Procedure: BRONCHOSCOPY, WITH NEEDLE ASPIRATION BIOPSY;  Surgeon: Denson Flake, MD;  Location: [facility] ENDOSCOPY;  Service: Pulmonary;;   CARDIOVERSION N/A 10/05/2020   Procedure: CARDIOVERSION;  Surgeon: Harrold Lincoln, MD;  Location: [name] ENDOSCOPY;  Service: Cardiovascular;  Laterality: N/A;   CATARACT EXTRACTION, BILATERAL Bilateral    COLONOSCOPY WITH PROPOFOL  N/A  06/20/2021   Procedure: COLONOSCOPY WITH PROPOFOL ;  Surgeon: Tobin Forts, MD;  Location: [facility] ENDOSCOPY;  Service: Endoscopy;  Laterality: N/A;   COLONOSCOPY WITH PROPOFOL  N/A 09/16/2021   Procedure: COLONOSCOPY WITH PROPOFOL ;  Surgeon: Kenney Peacemaker, MD;  Location: [facility] ENDOSCOPY;  Service: Gastroenterology;  Laterality: N/A;   ENDARTERECTOMY FEMORAL Right 08/20/2020   Procedure: ENDARTERECTOMY  RIGHT FEMORAL ARTERY;  Surgeon: Young Hensen, MD;  Location: [facility] OR;  Service: Vascular;  Laterality: Right;   ENTEROSCOPY N/A 10/11/2021   Procedure: ENTEROSCOPY;  Surgeon: Urban Garden, MD;  Location: [facility] ENDO SUITE;  Service: Gastroenterology;  Laterality: N/A;   ENTEROSCOPY N/A 02/18/2023   Procedure: ENTEROSCOPY;  Surgeon: Suzette Espy, MD;  Location: [facility] ENDO SUITE;  Service: Endoscopy;  Laterality: N/A;  EGD with possible small bowel push enteroscopy   ESOPHAGOGASTRODUODENOSCOPY N/A 09/16/2021   Procedure: ESOPHAGOGASTRODUODENOSCOPY (EGD);  Surgeon: Kenney Peacemaker, MD;  Location: [facility] ENDOSCOPY;  Service: Gastroenterology;  Laterality: N/A;   ESOPHAGOGASTRODUODENOSCOPY (EGD) WITH PROPOFOL  N/A 09/06/2020   Procedure: ESOPHAGOGASTRODUODENOSCOPY (EGD) WITH PROPOFOL ;  Surgeon: Tobin Forts, MD;  Location: [facility] ENDOSCOPY;  Service: Endoscopy;  Laterality: N/A;   ESOPHAGOGASTRODUODENOSCOPY (EGD) WITH PROPOFOL  N/A 10/11/2021   Procedure: ESOPHAGOGASTRODUODENOSCOPY (EGD) WITH PROPOFOL ;  Surgeon: Urban Garden, MD;  Location: [facility] ENDO SUITE;  Service: Gastroenterology;  Laterality: N/A;   ESOPHAGOGASTRODUODENOSCOPY (EGD) WITH PROPOFOL  N/A 02/18/2023   Procedure: ESOPHAGOGASTRODUODENOSCOPY (EGD) WITH PROPOFOL ;  Surgeon: Suzette Espy, MD;  Location: [facility] ENDO SUITE;  Service: Endoscopy;  Laterality: N/A;   GIVENS CAPSULE STUDY N/A 02/19/2023   Procedure: GIVENS CAPSULE STUDY;  Surgeon: Hargis Lias, MD;  Location: [facility] ENDO SUITE;  Service: Endoscopy;  Laterality: N/A;    HEMOSTASIS CLIP PLACEMENT  09/16/2021   Procedure: HEMOSTASIS CLIP PLACEMENT;  Surgeon: Kenney Peacemaker, MD;  Location: [facility] ENDOSCOPY;  Service: Gastroenterology;;   HOT HEMOSTASIS N/A 09/16/2021   Procedure: HOT HEMOSTASIS (ARGON PLASMA COAGULATION/BICAP);  Surgeon: Kenney Peacemaker, MD;  Location: [facility] ENDOSCOPY;  Service: Gastroenterology;  Laterality: N/A;   HOT HEMOSTASIS  10/11/2021   Procedure: HOT HEMOSTASIS (ARGON PLASMA COAGULATION/BICAP);  Surgeon: Umberto Ganong, Bearl Limes, MD;  Location: [facility] ENDO SUITE;  Service: Gastroenterology;;   HOT HEMOSTASIS  02/18/2023   Procedure: HOT HEMOSTASIS (ARGON PLASMA COAGULATION/BICAP);  Surgeon: Suzette Espy, MD;  Location: [facility] ENDO SUITE;  Service: Endoscopy;;   INSERTION OF ILIAC STENT Right 08/20/2020   Procedure: RETROGRADE INSERTION OF RIGHT ILIAC STENT;  Surgeon: Young Hensen, MD;  Location[facility] OR;  Service: Vascular;  Laterality: Right;   INTRAOPERATIVE ARTERIOGRAM Right 08/20/2020   Procedure: INTRA OPERATIVE ARTERIOGRAM ILIAC;  Surgeon: Young Hensen, MD;  Location: [facility] OR;  Service: Vascular;  Laterality: Right;   PATCH ANGIOPLASTY Right 08/20/2020   Procedure: PATCH ANGIOPLASTY RIGHT FEMORAL ARTERY;  Surgeon: Young Hensen, MD;  Location: [facility] OR;  Service: Vascular;  Laterality: Right;   PERIPHERAL VASCULAR BALLOON ANGIOPLASTY Right 05/30/2021   Procedure: PERIPHERAL VASCULAR BALLOON ANGIOPLASTY;  Surgeon: Young Hensen, MD;  Location: [facility] INVASIVE CV LAB;  Service: Cardiovascular;  Laterality: Right;   PERIPHERAL VASCULAR INTERVENTION Right 02/05/2023   Procedure: PERIPHERAL VASCULAR INTERVENTION;  Surgeon: Young Hensen, MD;  Location: [facility] INVASIVE CV LAB;  Service: Cardiovascular;  Laterality: Right;  Rt SFA and Rt Ext Iliac   PORTACATH PLACEMENT Left 06/13/2016   Procedure: INSERTION PORT-A-CATH;  Surgeon: Alanda Allegra, MD;  Location: [facility] ORS;  Service: General;  Laterality: Left;   TRANSURETHRAL RESECTION  OF PROSTATE N/A 05/31/2020   Procedure: TRANSURETHRAL RESECTION OF THE PROSTATE (TURP);  Surgeon: Marco Severs, MD;  Location: [facility] ORS;  Service: Urology;  Laterality: N/A;   VIDEO BRONCHOSCOPY WITH ENDOBRONCHIAL NAVIGATION N/A 05/28/2016   Procedure: VIDEO BRONCHOSCOPY WITH ENDOBRONCHIAL NAVIGATION;  Surgeon: Zelphia Higashi, MD;  Location: [facility] OR;  Service: Thoracic;  Laterality: N/A;   VIDEO BRONCHOSCOPY WITH ENDOBRONCHIAL NAVIGATION Bilateral 08/03/2023   Procedure: VIDEO BRONCHOSCOPY WITH ENDOBRONCHIAL NAVIGATION;  Surgeon: Denson Flake, MD;  Location: [facility] ENDOSCOPY;  Service: Pulmonary;  Laterality: Bilateral;  WITH FLUORO   VIDEO BRONCHOSCOPY WITH ENDOBRONCHIAL ULTRASOUND N/A 05/28/2016   Procedure: VIDEO BRONCHOSCOPY WITH ENDOBRONCHIAL ULTRASOUND;  Surgeon: Zelphia Higashi, MD;  Location: [facility] OR;  Service: Thoracic;  Laterality: N/A;    Social History: Social History   Socioeconomic History   Marital status: Significant Other    Spouse name: Not on file   Number of children: 5   Years of education: 10   Highest education level: 10th grade  Occupational History   Occupation: Retired  Tobacco Use   Smoking status: Every Day    Current packs/day: 0.50    Average packs/day: 0.5 packs/day for 62.5 years (31.2 ttl pk-yrs)    Types: Cigarettes    Start date: 03/11/1961   Smokeless tobacco: Never   Tobacco comments:    1/2 pack to 1 pack per day- 07/30/2023, informed to not smoke 24 hrs prior to procedure.  Vaping Use   Vaping status: Never Used  Substance and Sexual Activity   Alcohol use: No   Drug use: No   Sexual activity: Yes    Birth control/protection: None  Other Topics Concern   Not on file  Social History Narrative   John [name] is his POA and lives with him. Children out of town - one in [facility], others in [place].   Social Drivers of Corporate investment banker Strain: Low Risk  (08/06/2023)   Received from [facility]   Overall Financial  Resource Strain (CARDIA)    Difficulty of Paying Living Expenses: Not hard at all  Food Insecurity: No Food Insecurity (08/10/2023)   Hunger Vital Sign    Worried About Running Out of Food in the Last Year: Never true    Ran Out of Food in the Last Year: Never true  Transportation Needs: No Transportation Needs (08/10/2023)   PRAPARE - Administrator, Civil Service (Medical): No    Lack of Transportation (Non-Medical): No  Physical Activity: Insufficiently Active (05/30/2023)   Exercise Vital Sign    Days of Exercise per Week: 3 days    Minutes of Exercise per Session: 10 min  Stress: No Stress  Concern Present (05/30/2023)   John [name] - Occupational Stress Questionnaire    Feeling of Stress : Not at all  Social Connections: Moderately Isolated (05/30/2023)   Social Connection and Isolation Panel [NHANES]    Frequency of Communication with Friends and Family: Twice a week    Frequency of Social Gatherings with Friends and Family: Twice a week    Attends Religious Services: Never    Database administrator or Organizations: No    Attends Banker Meetings: Never    Marital Status: Married  Catering manager Violence: Not At Risk (08/10/2023)   Humiliation, Afraid, Rape, and Kick questionnaire    Fear of Current or Ex-Partner: No    Emotionally Abused: No    Physically Abused: No    Sexually Abused: No    Family History: Family History  Problem Relation Age of Onset   Hypertension Mother    Diabetes Father    Heart disease Father    Stroke Father    Hypertension Sister    Sleep apnea Neg Hx     Current Medications:  Current Outpatient Medications:    albuterol  (VENTOLIN  HFA) 108 (90 Base) MCG/ACT inhaler, INHALE 2 PUFFS INTO THE LUNGS EVERY 6 HOURS AS NEEDED FOR WHEEZE OR SHORTNESS OF BREATH, Disp: 8.5 each, Rfl: 5   allopurinol  (ZYLOPRIM ) 300 MG tablet, Take 1 tablet (300 mg total) by mouth daily., Disp: 90 tablet, Rfl: 0    bisoprolol  (ZEBETA ) 5 MG tablet, Take 1 tablet (5 mg total) by mouth daily., Disp: 90 tablet, Rfl: 2   Blood Glucose Monitoring Suppl (ONETOUCH VERIO REFLECT) w/Device KIT, Use to test blood sugars daily as directed. DX: E11.9, Disp: 1 kit, Rfl: 1   clopidogrel  (PLAVIX ) 75 MG tablet, Take 1 tablet (75 mg total) by mouth daily., Disp: 30 tablet, Rfl: 11   Continuous Glucose Receiver (FREESTYLE LIBRE 3 READER) DEVI, 1 Device by Does not apply route every 14 (fourteen) days., Disp: 1 each, Rfl: 3   Continuous Glucose Sensor (FREESTYLE LIBRE 3 SENSOR) MISC, 1 Device by Does not apply route continuous., Disp: 1 each, Rfl: 2   diltiazem  (CARDIZEM  CD) 120 MG 24 hr capsule, Take 1 capsule (120 mg total) by mouth daily as needed., Disp: 90 capsule, Rfl: 3   docusate sodium  (COLACE) 100 MG capsule, Take 1 capsule (100 mg total) by mouth daily as needed for mild constipation., Disp: , Rfl:    dutasteride  (AVODART ) 0.5 MG capsule, Take 1 capsule (0.5 mg total) by mouth daily., Disp: 90 capsule, Rfl: 3   famotidine  (PEPCID ) 20 MG tablet, Take 1 tablet (20 mg total) by mouth daily., Disp: 30 tablet, Rfl: 1   ferrous sulfate  325 (65 FE) MG EC tablet, Take 1 tablet (325 mg total) by mouth daily., Disp: , Rfl:    Fluticasone -Umeclidin-Vilant (TRELEGY ELLIPTA ) 100-62.5-25 MCG/ACT AEPB, Inhale 1 puff into the lungs daily., Disp: 1 each, Rfl: 11   gabapentin  (NEURONTIN ) 300 MG capsule, Take 1-2 capsules (300-600 mg total) by mouth 2 (two) times daily. Take 300mg  (1 cap) in the morning and 600mg  (2 caps) in the evening for a total daily dose of 900mg ., Disp: , Rfl:    insulin  degludec (TRESIBA  FLEXTOUCH) 100 UNIT/ML FlexTouch Pen, Inject 20 Units into the skin at bedtime., Disp: 18 mL, Rfl: 1   Insulin  Pen Needle (BD PEN NEEDLE NANO U/F) 32G X 4 MM MISC, Use to inject insulin  once daily, Disp: 100 each, Rfl: 3   levothyroxine  (  SYNTHROID ) 50 MCG tablet, Take 1 tablet (50 mcg total) by mouth daily., Disp: 90 tablet, Rfl: 1    linaclotide  (LINZESS ) 72 MCG capsule, Take 1 capsule (72 mcg total) by mouth daily before breakfast., Disp: 90 capsule, Rfl: 1   metolazone  (ZAROXOLYN ) 2.5 MG tablet, Take 1 tablet (2.5 mg total) by mouth 3 (three) times a week., Disp: 45 tablet, Rfl: 2   mupirocin ointment (BACTROBAN) 2 %, Apply topically 2 (two) times daily., Disp: , Rfl:    NON FORMULARY, CPAP at bedtime, Disp: , Rfl:    ondansetron  (ZOFRAN ) 4 MG tablet, Take 1 tablet (4 mg total) by mouth every 8 (eight) hours as needed for nausea or vomiting., Disp: 90 tablet, Rfl: 1   OneTouch Delica Lancets 30G MISC, Use to test blood sugars daily as directed. DX: E11.9, Disp: 100 each, Rfl: 1   OXYGEN , Inhale 2 L into the lungs daily as needed (with Cpap at night)., Disp: , Rfl:    pantoprazole  (PROTONIX ) 40 MG tablet, Take 1 tablet (40 mg total) by mouth daily., Disp: 90 tablet, Rfl: 0   pravastatin  (PRAVACHOL ) 40 MG tablet, TAKE 1 TABLET (40 MG TOTAL) BY MOUTH ON MONDAYS , WEDNESDAYS, AND FRIDAYS AS DIRECTED, Disp: 48 tablet, Rfl: 4   propranolol  (INDERAL ) 10 MG tablet, Take 1 tablet (10 mg total) by mouth 3 (three) times daily as needed., Disp: 90 tablet, Rfl: 6   spironolactone  (ALDACTONE ) 25 MG tablet, Take 1 tablet (25 mg total) by mouth 2 (two) times daily., Disp: 180 tablet, Rfl: 3   torsemide  (DEMADEX ) 20 MG tablet, Take 0.5 tablets (10 mg total) by mouth daily., Disp: 90 tablet, Rfl: 2   zolpidem  (AMBIEN ) 5 MG tablet, Take 1 tablet (5 mg total) by mouth at bedtime as needed for sleep., Disp: 30 tablet, Rfl: 2   megestrol  (MEGACE ) 400 MG/10ML suspension, Take 10 mLs (400 mg total) by mouth 2 (two) times daily., Disp: 480 mL, Rfl: 3   Allergies: Allergies  Allergen Reactions   Bisoprolol  Other (See Comments)    Confusion, delirium    Sulfa  Antibiotics Swelling    Mouth swelling   Atorvastatin Other (See Comments)    Muscle aches - tolerating Pravastatin  40 mg MWF   Jardiance  [Empagliflozin ] Other (See Comments)    FEELS  SLUGGISH, TIRED   Lopressor  [Metoprolol ] Other (See Comments)    Fatigue   Rosuvastatin Other (See Comments)    Muscle aches - tolerating Pravastatin  40 mg MWF   Doxycycline  Nausea Only   Temazepam  Other (See Comments)    Made insomnia worse    Tramadol  Itching    REVIEW OF SYSTEMS:   Review of Systems  Constitutional:  Negative for chills, fatigue and fever.  HENT:   Negative for lump/mass, mouth sores, nosebleeds, sore throat and trouble swallowing.   Eyes:  Negative for eye problems.  Respiratory:  Positive for cough and shortness of breath. Negative for chest tightness.   Cardiovascular:  Negative for chest pain, leg swelling and palpitations.  Gastrointestinal:  Positive for constipation and nausea. Negative for abdominal pain, diarrhea and vomiting.  Genitourinary:  Negative for bladder incontinence, difficulty urinating, dysuria, frequency, hematuria and nocturia.   Musculoskeletal:  Negative for arthralgias, back pain, flank pain, myalgias and neck pain.  Skin:  Negative for itching and rash.  Neurological:  Negative for dizziness, headaches and numbness.  Hematological:  Does not bruise/bleed easily.  Psychiatric/Behavioral:  Negative for depression, sleep disturbance and suicidal ideas. The patient is not nervous/anxious.  All other systems reviewed and are negative.    VITALS:   Blood pressure 138/83, pulse (!) 105, temperature 98.3 F (36.8 C), temperature source Oral, resp. rate 19, SpO2 94%.  Wt Readings from Last 3 Encounters:  08/24/23 152 lb 12.8 oz (69.3 kg)  08/17/23 145 lb 8.1 oz (66 kg)  08/12/23 149 lb (67.6 kg)    There is no height or weight on file to calculate BMI.  Performance status (ECOG): 1 - Symptomatic but completely ambulatory  PHYSICAL EXAM:   Physical Exam Vitals and nursing note reviewed. Exam conducted with a chaperone present.  Constitutional:      Appearance: Normal appearance.  Cardiovascular:     Rate and Rhythm: Normal rate  and regular rhythm.     Pulses: Normal pulses.     Heart sounds: Normal heart sounds.  Pulmonary:     Effort: Pulmonary effort is normal.     Breath sounds: Normal breath sounds.  Abdominal:     Palpations: Abdomen is soft. There is no hepatomegaly, splenomegaly or mass.     Tenderness: There is no abdominal tenderness.  Musculoskeletal:     Right lower leg: No edema.     Left lower leg: No edema.  Lymphadenopathy:     Cervical: No cervical adenopathy.     Right cervical: No superficial, deep or posterior cervical adenopathy.    Left cervical: No superficial, deep or posterior cervical adenopathy.     Upper Body:     Right upper body: No supraclavicular or axillary adenopathy.     Left upper body: No supraclavicular or axillary adenopathy.  Neurological:     General: No focal deficit present.     Mental Status: He is alert and oriented to person, place, and time.  Psychiatric:        Mood and Affect: Mood normal.        Behavior: Behavior normal.     LABS:   CBC     Component Value Date/Time   WBC 11.4 (H) 08/12/2023 1230   WBC 11.0 (H) 06/29/2023 1521   RBC 3.99 (L) 08/12/2023 1230   RBC 4.34 06/29/2023 1521   HGB 13.0 08/12/2023 1230   HCT 38.2 08/12/2023 1230   PLT 250 08/12/2023 1230   MCV 96 08/12/2023 1230   MCH 32.6 08/12/2023 1230   MCH 31.6 06/29/2023 1521   MCHC 34.0 08/12/2023 1230   MCHC 31.7 06/29/2023 1521   RDW 14.4 08/12/2023 1230   LYMPHSABS 0.7 08/12/2023 1230   MONOABS 0.8 06/29/2023 1521   EOSABS 0.2 08/12/2023 1230   BASOSABS 0.0 08/12/2023 1230    CMP      Component Value Date/Time   NA 138 08/12/2023 1230   K 3.9 08/12/2023 1230   CL 98 08/12/2023 1230   CO2 25 08/12/2023 1230   GLUCOSE 99 08/12/2023 1230   GLUCOSE 179 (H) 06/29/2023 1521   BUN 14 08/12/2023 1230   CREATININE 0.80 08/12/2023 1230   CALCIUM 8.9 08/12/2023 1230   PROT 6.2 08/12/2023 1230   ALBUMIN 3.7 (L) 08/12/2023 1230   AST 12 08/12/2023 1230   ALT 19  08/12/2023 1230   ALKPHOS 122 (H) 08/12/2023 1230   BILITOT 0.4 08/12/2023 1230   GFRNONAA >60 06/29/2023 1521   GFRAA 102 05/24/2020 1238     No results found for: "CEA1", "CEA" / No results found for: "CEA1", "CEA" Lab Results  Component Value Date   PSA1 <0.1 10/31/2022   No results found for: "ZOX096"  No results found for: "CAN125"  Lab Results  Component Value Date   TOTALPROTELP 7.0 04/18/2021   ALBUMINELP 3.7 04/18/2021   A1GS 0.3 04/18/2021   A2GS 0.8 04/18/2021   BETS 1.2 04/18/2021   GAMS 1.0 04/18/2021   MSPIKE Not Observed 04/18/2021   SPEI Comment 04/18/2021   Lab Results  Component Value Date   TIBC 332 06/29/2023   TIBC 425 03/24/2023   TIBC 541 (H) 02/17/2023   FERRITIN 185 06/29/2023   FERRITIN 29 03/24/2023   FERRITIN 10 (L) 02/17/2023   IRONPCTSAT 25 06/29/2023   IRONPCTSAT 49 (H) 03/24/2023   IRONPCTSAT 4 (L) 02/17/2023   Lab Results  Component Value Date   LDH 133 08/02/2018   LDH 126 03/08/2018   LDH 141 10/28/2017     STUDIES:   DG Chest Port 1 View Result Date: 08/03/2023 CLINICAL DATA:  Post bronchoscopy with biopsy. EXAM: PORTABLE CHEST 1 VIEW COMPARISON:  PET-CT 07/09/2023.  Chest radiograph 05/29/2023 FINDINGS: Shallow inspiration. A loop recorder is present. Power port type central venous catheter with tip over the mid SVC region. Left upper lung mass as previously demonstrated. Right paratracheal and right hilar shadows likely representing lymphadenopathy. No pleural effusion or pneumothorax. Azygous lobe. Calcification of the aorta and mitral valve annulus. Degenerative changes in the spine and shoulders. IMPRESSION: 1. Persistent finding of left upper lobe mass and mass or lymphadenopathy along the right paratracheal and right hilar regions. 2. Appliances appear in satisfactory position. Electronically Signed   By: Boyce Byes M.D.   On: 08/03/2023 17:09   DG C-ARM BRONCHOSCOPY Result Date: 08/03/2023 C-ARM BRONCHOSCOPY:  Fluoroscopy was utilized by the requesting physician.  No radiographic interpretation.   DG C-Arm 1-60 Min-No Report Result Date: 08/03/2023 Fluoroscopy was utilized by the requesting physician.  No radiographic interpretation.   CUP PACEART REMOTE DEVICE CHECK Result Date: 08/03/2023 ILR summary report received. Battery status OK. Normal device function. No new symptom, tachy, brady, or pause episodes. No new AF episodes. Monthly summary reports and ROV/PRN 3 AT events, longest duration 6hrs , HR's 115-116 LA, CVRS

## 2023-09-01 NOTE — Patient Instructions (Signed)
 [facility] at [facility] Discharge Instructions   You were seen and examined today by Dr. Cheree Cords.  He reviewed the results of the special testing we sent on your biopsy. The best thing to stop your cancer from spreading and growing is to give you chemotherapy and immunotherapy. You have had this chemotherapy in the past (Taxol  and carboplatin ). The immunotherapy drugs are called Opdivo and Yervoy. Treatment is given every 3 weeks. The chemo is only given the first two treatments.   We will plan to start you next week.   Return as scheduled.    Thank you for choosing [facility] at [facility] to provide your oncology and hematology care.  To afford each patient quality time with our provider, please arrive at least 15 minutes before your scheduled appointment time.   If you have a lab appointment with the Cancer Center please come in thru the Main Entrance and check in at the main information desk.  You need to re-schedule your appointment should you arrive 10 or more minutes late.  We strive to give you quality time with our providers, and arriving late affects you and other patients whose appointments are after yours.  Also, if you no show three or more times for appointments you may be dismissed from the clinic at the providers discretion.     Again, thank you for choosing [facility].  Our hope is that these requests will decrease the amount of time that you wait before being seen by our physicians.       _____________________________________________________________  Should you have questions after your visit to [facility], please contact our office at [phone] and follow the prompts.  Our office hours are 8:00 a.m. and 4:30 p.m. Monday - Friday.  Please note that voicemails left after 4:00 p.m. may not be returned until the following business day.  We are closed weekends and major holidays.  You do have  access to a nurse 24-7, just call the main number to the clinic [phone] and do not press any options, hold on the line and a nurse will answer the phone.    For prescription refill requests, have your pharmacy contact our office and allow 72 hours.    Due to Covid, you will need to wear a mask upon entering the hospital. If you do not have a mask, a mask will be given to you at the Main Entrance upon arrival. For doctor visits, patients may have 1 support person age 41 or older with them. For treatment visits, patients can not have anyone with them due to social distancing guidelines and our immunocompromised population.

## 2023-09-02 ENCOUNTER — Other Ambulatory Visit: Payer: Self-pay

## 2023-09-02 ENCOUNTER — Other Ambulatory Visit

## 2023-09-02 ENCOUNTER — Encounter (HOSPITAL_COMMUNITY): Payer: Self-pay

## 2023-09-02 NOTE — Progress Notes (Signed)
 Pharmacist Chemotherapy Monitoring - Initial Assessment    Anticipated start date: 09/09/23   The following has been reviewed per standard work regarding the patient's treatment regimen: The patient's diagnosis, treatment plan and drug doses, and organ/hematologic function Lab orders and baseline tests specific to treatment regimen  The treatment plan start date, drug sequencing, and pre-medications Prior authorization status  Patient's documented medication list, including drug-drug interaction screen and prescriptions for anti-emetics and supportive care specific to the treatment regimen The drug concentrations, fluid compatibility, administration routes, and timing of the medications to be used The patient's access for treatment and lifetime cumulative dose history, if applicable  The patient's medication allergies and previous infusion related reactions, if applicable   Changes made to treatment plan:  N/A  Follow up needed:  N/A   John [name], [facility], 09/02/2023  3:08 PM

## 2023-09-04 ENCOUNTER — Encounter: Payer: Self-pay | Admitting: Family

## 2023-09-04 ENCOUNTER — Ambulatory Visit

## 2023-09-04 ENCOUNTER — Ambulatory Visit: Admitting: Family

## 2023-09-04 VITALS — BP 132/66 | HR 110 | Temp 98.1°F | Ht 66.0 in | Wt 150.6 lb

## 2023-09-04 DIAGNOSIS — I5032 Chronic diastolic (congestive) heart failure: Secondary | ICD-10-CM

## 2023-09-04 DIAGNOSIS — M150 Primary generalized (osteo)arthritis: Secondary | ICD-10-CM

## 2023-09-04 DIAGNOSIS — J449 Chronic obstructive pulmonary disease, unspecified: Secondary | ICD-10-CM | POA: Diagnosis not present

## 2023-09-04 DIAGNOSIS — E1142 Type 2 diabetes mellitus with diabetic polyneuropathy: Secondary | ICD-10-CM

## 2023-09-04 DIAGNOSIS — J9611 Chronic respiratory failure with hypoxia: Secondary | ICD-10-CM | POA: Diagnosis not present

## 2023-09-04 DIAGNOSIS — C3491 Malignant neoplasm of unspecified part of right bronchus or lung: Secondary | ICD-10-CM

## 2023-09-04 DIAGNOSIS — I5033 Acute on chronic diastolic (congestive) heart failure: Secondary | ICD-10-CM

## 2023-09-04 DIAGNOSIS — E039 Hypothyroidism, unspecified: Secondary | ICD-10-CM

## 2023-09-04 DIAGNOSIS — F5101 Primary insomnia: Secondary | ICD-10-CM

## 2023-09-04 DIAGNOSIS — E1159 Type 2 diabetes mellitus with other circulatory complications: Secondary | ICD-10-CM | POA: Diagnosis not present

## 2023-09-04 DIAGNOSIS — I152 Hypertension secondary to endocrine disorders: Secondary | ICD-10-CM | POA: Diagnosis not present

## 2023-09-04 DIAGNOSIS — C771 Secondary and unspecified malignant neoplasm of intrathoracic lymph nodes: Secondary | ICD-10-CM

## 2023-09-04 MED ORDER — GABAPENTIN 300 MG PO CAPS: 300.0000 mg | ORAL_CAPSULE | Freq: Three times a day (TID) | ORAL | 2 refills | Status: DC

## 2023-09-04 MED ORDER — MEGESTROL ACETATE 400 MG/10ML PO SUSP: 400.0000 mg | Freq: Two times a day (BID) | ORAL | 3 refills | Status: DC

## 2023-09-04 MED ORDER — ALLOPURINOL 300 MG PO TABS: 300.0000 mg | ORAL_TABLET | Freq: Every day | ORAL | 0 refills | Status: DC

## 2023-09-04 MED ORDER — ZOLPIDEM TARTRATE 5 MG PO TABS
5.0000 mg | ORAL_TABLET | Freq: Every evening | ORAL | 2 refills | Status: DC | PRN
Start: 2023-09-04 — End: 2023-10-20

## 2023-09-04 MED ORDER — PREDNISONE 10 MG (21) PO TBPK: ORAL_TABLET | ORAL | 0 refills | Status: DC

## 2023-09-04 NOTE — Progress Notes (Addendum)
 Subjective:    Patient ID: John [name], male    DOB: 1945/10/01, 78 y.o.   MRN: [id number]  Chief Complaint  Patient presents with   Follow-up    SOB 3-4 WEEKS   Pt presents to the office today for chronic care follow up.   He is followed by Cardiologists  for CHF.    Followed by Oncologists every 3 months for non-small lung cancer. He completed radiation. Has a lung biopsy 08/03/23.He is restarting chemo next week.     He is followed by Pulmonologist every 6 months for COPD. He continues to smoke 1/2 and complaining of increased SOB. Wears oxygen  2 L intermittently.    He is followed by Vascular for PAD.    He is followed by Urologists for BPH and Prostate.   He has seen GI for bleed, but stable now.     Has OSA and using CPAP nightly.   Hypertension This is a chronic problem. The current episode started more than 1 year ago. The problem has been resolved since onset. The problem is controlled. Associated symptoms include blurred vision (some times), malaise/fatigue and shortness of breath. Pertinent negatives include no peripheral edema. Risk factors for coronary artery disease include dyslipidemia, obesity, male gender and sedentary lifestyle. The current treatment provides moderate improvement. Identifiable causes of hypertension include a thyroid  problem.  Congestive Heart Failure Presents for follow-up visit. Associated symptoms include fatigue, nocturia (1) and shortness of breath. Pertinent negatives include no edema. The symptoms have been stable.  Gastroesophageal Reflux He complains of belching. He reports no globus sensation, no heartburn or no nausea. This is a chronic problem. The current episode started more than 1 year ago. The problem occurs occasionally. Associated symptoms include fatigue. He has tried a PPI and an herbal remedy for the symptoms. The treatment provided moderate relief.  Hyperlipidemia This is a chronic problem. The current episode started  more than 1 year ago. Exacerbating diseases include obesity. Associated symptoms include shortness of breath. Current antihyperlipidemic treatment includes statins and diet change. The current treatment provides moderate improvement of lipids. Risk factors for coronary artery disease include dyslipidemia, hypertension and a sedentary lifestyle.  Thyroid  Problem Presents for follow-up visit. Symptoms include anxiety, constipation (improved) and fatigue. The symptoms have been stable.  Arthritis Presents for follow-up visit. He complains of pain. Affected locations include the left hip, left knee, right knee, right hip, left MCP and right MCP. His pain is at a severity of 5/10. Associated symptoms include fatigue.  Benign Prostatic Hypertrophy This is a chronic problem. The current episode started more than 1 year ago. Irritative symptoms include nocturia (1). Pertinent negatives include no nausea. The treatment provided moderate relief.  Insomnia Primary symptoms: sleep disturbance, difficulty falling asleep, frequent awakening, malaise/fatigue.   The current episode started more than one year. The problem occurs intermittently. Past treatments include medication. The treatment provided moderate relief.  Constipation This is a chronic problem. The current episode started more than 1 year ago. His stool frequency is 2 to 3 times per week (with linzess ). Pertinent negatives include no nausea. He has tried laxatives (linzess ) for the symptoms. The treatment provided moderate relief.  Diabetes He presents for his follow-up diabetic visit. He has type 2 diabetes mellitus. Hypoglycemia symptoms include nervousness/anxiousness. Associated symptoms include blurred vision (some times), fatigue and foot paresthesias. Diabetic complications include peripheral neuropathy. Risk factors for coronary artery disease include diabetes mellitus, dyslipidemia, male sex, hypertension and sedentary lifestyle. He is following  a generally healthy diet. His overall blood glucose range is 110-130 mg/dl.      Review of Systems  Constitutional:  Positive for fatigue and malaise/fatigue.  Eyes:  Positive for blurred vision (some times).  Respiratory:  Positive for shortness of breath.   Gastrointestinal:  Positive for constipation (improved). Negative for heartburn and nausea.  Genitourinary:  Positive for nocturia (1).  Psychiatric/Behavioral:  Positive for sleep disturbance. The patient is nervous/anxious.   All other systems reviewed and are negative.  Family History  Problem Relation Age of Onset   Hypertension Mother    Diabetes Father    Heart disease Father    Stroke Father    Hypertension Sister    Sleep apnea Neg Hx    Social History   Socioeconomic History   Marital status: Significant Other    Spouse name: Not on file   Number of children: 5   Years of education: 10   Highest education level: 10th grade  Occupational History   Occupation: Retired  Tobacco Use   Smoking status: Every Day    Current packs/day: 0.50    Average packs/day: 0.5 packs/day for 62.5 years (31.2 ttl pk-yrs)    Types: Cigarettes    Start date: 03/11/1961   Smokeless tobacco: Never   Tobacco comments:    1/2 pack to 1 pack per day- 07/30/2023, informed to not smoke 24 hrs prior to procedure.  Vaping Use   Vaping status: Never Used  Substance and Sexual Activity   Alcohol use: No   Drug use: No   Sexual activity: Yes    Birth control/protection: None  Other Topics Concern   Not on file  Social History Narrative   [name] is his POA and lives with him. Children out of town - one in [facility], others in [place].   Social Drivers of Corporate investment banker Strain: Low Risk  (08/06/2023)   Received from [facility]   Overall Financial Resource Strain (CARDIA)    Difficulty of Paying Living Expenses: Not hard at all  Food Insecurity: No Food Insecurity (08/10/2023)   Hunger Vital Sign    Worried About  Running Out of Food in the Last Year: Never true    Ran Out of Food in the Last Year: Never true  Transportation Needs: No Transportation Needs (08/10/2023)   PRAPARE - Administrator, Civil Service (Medical): No    Lack of Transportation (Non-Medical): No  Physical Activity: Insufficiently Active (05/30/2023)   Exercise Vital Sign    Days of Exercise per Week: 3 days    Minutes of Exercise per Session: 10 min  Stress: No Stress Concern Present (05/30/2023)   [employer] of Occupational Health - Occupational Stress Questionnaire    Feeling of Stress : Not at all  Social Connections: Moderately Isolated (05/30/2023)   Social Connection and Isolation Panel [NHANES]    Frequency of Communication with Friends and Family: Twice a week    Frequency of Social Gatherings with Friends and Family: Twice a week    Attends Religious Services: Never    Database administrator or Organizations: No    Attends Banker Meetings: Never    Marital Status: Married        Objective:   Physical Exam Vitals reviewed.  Constitutional:      General: He is not in acute distress.    Appearance: He is well-developed. He is obese.  HENT:  Head: Normocephalic.     Right Ear: Tympanic membrane normal.     Left Ear: Tympanic membrane normal.  Eyes:     General:        Right eye: No discharge.        Left eye: No discharge.     Pupils: Pupils are equal, round, and reactive to light.  Neck:     Thyroid : No thyromegaly.  Cardiovascular:     Rate and Rhythm: Normal rate and regular rhythm.     Heart sounds: Murmur heard.  Pulmonary:     Effort: Pulmonary effort is normal. No respiratory distress.     Breath sounds: Wheezing present.     Comments: Wearing 2 L of O2 Abdominal:     General: Bowel sounds are normal. There is no distension.     Palpations: Abdomen is soft.     Tenderness: There is no abdominal tenderness.  Musculoskeletal:        General: Tenderness (pain in  right knee with flexion) present. Normal range of motion.     Cervical back: Normal range of motion and neck supple.  Skin:    General: Skin is warm and dry.     Findings: No erythema or rash.  Neurological:     Mental Status: He is alert and oriented to person, place, and time.     Cranial Nerves: No cranial nerve deficit.     Motor: Weakness present.     Gait: Gait abnormal (using cane).     Deep Tendon Reflexes: Reflexes are normal and symmetric.  Psychiatric:        Behavior: Behavior normal.        Thought Content: Thought content normal.        Judgment: Judgment normal.     BP 132/66   Pulse (!) 120   Temp 98.1 F (36.7 C) (Temporal)   Ht 5\' 6"  (1.676 m)   Wt 150 lb 9.6 oz (68.3 kg)   SpO2 (!) 83% Comment: COME UP TO 92  BMI 24.31 kg/m      Assessment & Plan:  JAHSEH LUCCHESE comes in today with chief complaint of Follow-up (SOB 3-4 WEEKS)   Diagnosis and orders addressed:  1. Diabetic polyneuropathy associated with type 2 diabetes mellitus (HCC) - gabapentin  (NEURONTIN ) 300 MG capsule; Take 1 capsule (300 mg total) by mouth 3 (three) times daily.  Dispense: 270 capsule; Refill: 2  2. Primary insomnia - zolpidem  (AMBIEN ) 5 MG tablet; Take 1 tablet (5 mg total) by mouth at bedtime as needed for sleep.  Dispense: 30 tablet; Refill: 2  3. Hypertension associated with diabetes (HCC) (Primary)  4. Chronic obstructive pulmonary disease, unspecified COPD type (HCC)   5. Non-small cell lung cancer, right (HCC) - megestrol  (MEGACE ) 400 MG/10ML suspension; Take 10 mLs (400 mg total) by mouth 2 (two) times daily.  Dispense: 480 mL; Refill: 3  6. Chronic diastolic CHF (congestive heart failure) (HCC)   7. Type 2 diabetes mellitus with diabetic polyneuropathy, without long-term current use of insulin  (HCC)  8. Hypothyroidism, unspecified type  9. Primary osteoarthritis involving multiple joints   10. Chronic respiratory failure with hypoxia (HCC)   11.  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 Acute on chronic diastolic (congestive) heart failure [facility]    Labs reviewed from specialists  Start prednisone   Continue medications  Keep follow up with specialists, starting Chemo next week Encouraged to bring home oxygen  Health Maintenance reviewed  Diet and exercise encouraged  Follow up plan: 3 months    Tommas Fragmin, FNP

## 2023-09-04 NOTE — Patient Instructions (Signed)
 Chronic Obstructive Pulmonary Disease Exacerbation  Chronic obstructive pulmonary disease (COPD) is a long-term (chronic) lung problem. When you have COPD, it can feel harder to breathe in or out. COPD exacerbation is a flare-up of symptoms when breathing gets worse and more treatment may be needed. Without treatment, flare-ups can be life-threatening. If they happen often, your lungs can become more damaged. What are the causes? Not taking your usual COPD medicines as told by your health care provider. A cold or the flu, which can cause infection in your lungs. Being exposed to things that make your breathing worse, such as: Smoke. Air pollution. Fumes. Dust. Allergies. Weather changes. What are the signs or symptoms? Symptoms do not get better or get worse even if you take your medicines as told by your provider. Symptoms may include: More shortness of breath. You may only be able to speak one or two words at a time. More coughing or mucus from your lungs. More wheezing or chest tightness. Being more tired and having less energy. Confusion. How is this diagnosed? This condition is diagnosed based on: Symptoms that get worse. Your medical history. A physical exam. You may also have tests, including: A chest X-ray. Blood or mucus tests. How is this treated? You may be able to stay home or you may need to go to the hospital. Treatment may include: Taking medicines. These may include: Inhalers. These have medicines in them that you breathe in. These may be more of what you already take or they may be new. Steroids. These reduce inflammation in the airways. These may be inhaled, taken by mouth, or given in an IV. Antibiotics. These treat infection. Using oxygen. Using a device to help you clear mucus. Follow these instructions at home: Medicines Take your medicines only as told by your provider. If you were given antibiotics or steroids, take them as told by your provider. Do  not stop taking them even if you start to feel better. Lifestyle Several times a day, wash your hands with soap and water for at least 20 seconds. If you cannot use soap and water, use hand sanitizer. This may help keep you from getting an infection. Avoid being around crowds or people who are sick. Do not smoke or use any products that contain nicotine or tobacco. If you need help quitting, ask your provider. Return to your normal activities when your provider says that it's safe. Use breathing methods to control your stress and catch your breath. How is this prevented? Follow your COPD action plan. The action plan tells you what to do if you're feeling good and what to do when you start feeling worse. Discuss the plan often with your provider. Make sure you get all the shots, also called vaccines, that your provider recommends. Ask your provider about a flu shot and a pneumonia shot. Use oxygen therapy if told by your provider. If you need home oxygen therapy, ask your provider how often to check your oxygen level with a device called an oximeter. Keep all follow-up visits to review your COPD action plan. Your provider will want to check on your condition often to keep you healthy and out of the hospital. Contact a health care provider if: Your COPD symptoms get worse. You have a fever or chills. You have trouble doing daily activities. You have trouble breathing even when you are resting. Get help right away if: You are short of breath and cannot: Talk in full sentences. Do normal activities. You have chest  pain. You feel confused. These symptoms may be an emergency. Call 911 right away. Do not wait to see if the symptoms will go away. Do not drive yourself to the hospital. This information is not intended to replace advice given to you by your health care provider. Make sure you discuss any questions you have with your health care provider. Document Revised: 12/25/2022 Document  Reviewed: 06/09/2022 Elsevier Patient Education  2024 [employer].

## 2023-09-07 ENCOUNTER — Other Ambulatory Visit: Payer: Self-pay

## 2023-09-07 ENCOUNTER — Ambulatory Visit: Payer: Self-pay | Admitting: Cardiology

## 2023-09-07 ENCOUNTER — Ambulatory Visit (INDEPENDENT_AMBULATORY_CARE_PROVIDER_SITE_OTHER)

## 2023-09-07 ENCOUNTER — Telehealth: Payer: Self-pay | Admitting: Cardiovascular Disease

## 2023-09-07 ENCOUNTER — Telehealth: Payer: Self-pay

## 2023-09-07 ENCOUNTER — Telehealth: Payer: Self-pay | Admitting: *Deleted

## 2023-09-07 DIAGNOSIS — I483 Typical atrial flutter: Secondary | ICD-10-CM

## 2023-09-07 LAB — CUP PACEART REMOTE DEVICE CHECK
Date Time Interrogation Session: 20250602043700
Pulse Gen Serial Number: 104324

## 2023-09-07 NOTE — Telephone Encounter (Signed)
 Patient was called and made aware. He verbalized understanding of what he is to do for Whitney.

## 2023-09-07 NOTE — Telephone Encounter (Signed)
 Increase Tresiba  to 28 units (10 more than what he is currently taking).  This will help in the interim but we may need meal time insulin  too.  Have him write his readings down over the next few days just in case.

## 2023-09-07 NOTE — Telephone Encounter (Signed)
 Pt c/o medication issue:  1. Name of Medication: bisoprolol  (ZEBETA ) 5 MG tablet   2. How are you currently taking this medication (dosage and times per day)? As written  3. Are you having a reaction (difficulty breathing--STAT)? No   4. What is your medication issue? Pharmacy has this pt has an allergic reaction on file for this medication. Please advise

## 2023-09-07 NOTE — Telephone Encounter (Signed)
 Patient left a voice message. He shared that the doctors have put him back on Prednisone . He takes it breakfast , lunch , supper and at bedtime. He said that they are giving this to him for his lung congestion. Last night his blood sugar was 400 .  He is asking what should he do with his diabetic medications as far as what he takes?

## 2023-09-07 NOTE — Telephone Encounter (Signed)
 Copied from CRM [id number]. Topic: Clinical - Prescription Issue >> Sep 07, 2023 10:32 AM Justina Oman C wrote: Reason for CRM: Leary Provencal from [employer]  [phone] needs verification if patient it to increase Fluticasone -Umeclidin-Vilant (TRELEGY ELLIPTA ) 100-62.5-25 MCG/ACT AEPB to 200 mcg?   Leary Provencal states Fluticasone -Umeclidin-Vilant (TRELEGY ELLIPTA ) 200 MCG/ACT AEPB was transferred from [facility] to [facility] 07/27/23 and needs clarification. Please advise and call back.  Spoke with Pam pharmacists from [facility]. Advised pharmacy that pt should be taking Trelegy 100 1 puff daily per lov with SG. NFN

## 2023-09-07 NOTE — Telephone Encounter (Signed)
 Spoke with Amy in Pharmacy and advised that medications was D/C on 04/03/23 and was not sure why it was sent in. Advised pt is now on propanolol 10mg  3 times daily.

## 2023-09-08 ENCOUNTER — Encounter: Payer: Self-pay | Admitting: Vascular Surgery

## 2023-09-08 ENCOUNTER — Ambulatory Visit (INDEPENDENT_AMBULATORY_CARE_PROVIDER_SITE_OTHER): Admitting: Vascular Surgery

## 2023-09-08 ENCOUNTER — Ambulatory Visit (INDEPENDENT_AMBULATORY_CARE_PROVIDER_SITE_OTHER)

## 2023-09-08 ENCOUNTER — Ambulatory Visit: Payer: Medicare Other | Admitting: Vascular Surgery

## 2023-09-08 ENCOUNTER — Other Ambulatory Visit: Payer: Self-pay | Admitting: Family

## 2023-09-08 VITALS — BP 155/75 | HR 104 | Ht 66.0 in | Wt 151.0 lb

## 2023-09-08 DIAGNOSIS — J449 Chronic obstructive pulmonary disease, unspecified: Secondary | ICD-10-CM

## 2023-09-08 DIAGNOSIS — Z95828 Presence of other vascular implants and grafts: Secondary | ICD-10-CM

## 2023-09-08 DIAGNOSIS — I739 Peripheral vascular disease, unspecified: Secondary | ICD-10-CM

## 2023-09-08 DIAGNOSIS — I5032 Chronic diastolic (congestive) heart failure: Secondary | ICD-10-CM

## 2023-09-08 LAB — VAS US ABI WITH/WO TBI
Left ABI: 0.84
Right ABI: 0.97

## 2023-09-08 NOTE — Progress Notes (Signed)
 Patient name: John [name] MRN: [id number] DOB: 09-Aug-1945 Sex: male  REASON FOR VISIT: 64-month follow-up for surveillance of PAD  HPI: John [name] is a 78 y.o. male with multiple medical comorbidities including hypertension, hyperlipidemia, diabetes, COPD, CHF, tobacco abuse that presents for 6 month follow-up for surveillence of PAD.    Initially underwent right common iliac stent with a right femoral endarterectomy on 08/20/2020 for short distance lifestyle limiting claudication.  He then developed recurrent symptoms in the right leg.  On 05/30/2021 he underwent right proximal common iliac artery angioplasty with drug-coated balloon, multiple right SFA and above-knee popliteal angioplasty with drug-coated balloon.  Most recently on 02/05/2023 he underwent angioplasty and stenting of his right SFA as well as angioplasty and stenting of his right external iliac artery.    States he was started on prednisone  for his lungs and subsequent developed a wound on the back of his right heel and states this is being treated by podiatry.  He is undergoing chemotherapy for lung cancer.  Past Medical History:  Diagnosis Date   Adenocarcinoma of lung, right (HCC) 04/18/2016   Anxiety    Arthritis    Asthma    Atrial flutter (HCC)    BPH (benign prostatic hyperplasia)    with urinary retention 02/06/20   CHF (congestive heart failure) (HCC)    COPD (chronic obstructive pulmonary disease) (HCC)    Depression    Diabetes mellitus, type II (HCC)    Dyspnea    GERD (gastroesophageal reflux disease)    History of blood transfusion    History of kidney stones    History of radiation therapy    right lung 09/10/2020-09/17/2020   Dr Eloise Hake   History of radiation therapy    Left and right Lung- 06/19/22-06/26/22   Hyperlipidemia    Hypertension    Hypothyroidism    IDA (iron  deficiency anemia)    Macular degeneration    Neuropathy    hands and feet   Non-small cell lung cancer, right (HCC)  04/18/2016   Oxygen  dependent    uses 2L oxygen  as needed   PAD (peripheral artery disease) (HCC) 03/2023   managed by Dr Jimmye Moulds   Peripheral vascular disease [facility])    Prostatitis    Pulmonary nodule, left 07/16/2016   Sleep apnea 2019   uses CPAP nightly    Past Surgical History:  Procedure Laterality Date   A-FLUTTER ABLATION N/A 01/13/2022   Procedure: A-FLUTTER ABLATION;  Surgeon: Boyce Byes, MD;  Location: [facility] INVASIVE CV LAB;  Service: Cardiovascular;  Laterality: N/A;   ABDOMINAL AORTOGRAM W/LOWER EXTREMITY Left 02/06/2020   Procedure: ABDOMINAL AORTOGRAM W/LOWER EXTREMITY;  Surgeon: Avanell Leigh, MD;  Location: [facility] INVASIVE CV LAB;  Service: Cardiovascular;  Laterality: Left;   ABDOMINAL AORTOGRAM W/LOWER EXTREMITY N/A 05/30/2021   Procedure: ABDOMINAL AORTOGRAM W/LOWER EXTREMITY;  Surgeon: Young Hensen, MD;  Location: [facility] INVASIVE CV LAB;  Service: Cardiovascular;  Laterality: N/A;   ABDOMINAL AORTOGRAM W/LOWER EXTREMITY N/A 02/05/2023   Procedure: ABDOMINAL AORTOGRAM W/LOWER EXTREMITY;  Surgeon: Young Hensen, MD;  Location: [facility] INVASIVE CV LAB;  Service: Cardiovascular;  Laterality: N/A;   BIOPSY  09/16/2021   Procedure: BIOPSY;  Surgeon: Kenney Peacemaker, MD;  Location: [facility] ENDOSCOPY;  Service: Gastroenterology;;   BRONCHIAL BIOPSY  08/03/2023   Procedure: BRONCHOSCOPY, WITH BIOPSY;  Surgeon: Denson Flake, MD;  Location: [facility] ENDOSCOPY;  Service: Pulmonary;;   BRONCHIAL BRUSHINGS  08/03/2023   Procedure: BRONCHOSCOPY, WITH BRUSH  BIOPSY;  Surgeon: Denson Flake, MD;  Location: [facility] ENDOSCOPY;  Service: Pulmonary;;   BRONCHIAL NEEDLE ASPIRATION BIOPSY  08/03/2023   Procedure: BRONCHOSCOPY, WITH NEEDLE ASPIRATION BIOPSY;  Surgeon: Denson Flake, MD;  Location: [facility] ENDOSCOPY;  Service: Pulmonary;;   CARDIOVERSION N/A 10/05/2020   Procedure: CARDIOVERSION;  Surgeon: Harrold Lincoln, MD;  Location: [facility] ENDOSCOPY;  Service: Cardiovascular;   Laterality: N/A;   CATARACT EXTRACTION, BILATERAL Bilateral    COLONOSCOPY WITH PROPOFOL  N/A 06/20/2021   Procedure: COLONOSCOPY WITH PROPOFOL ;  Surgeon: Tobin Forts, MD;  Location: [facility] ENDOSCOPY;  Service: Endoscopy;  Laterality: N/A;   COLONOSCOPY WITH PROPOFOL  N/A 09/16/2021   Procedure: COLONOSCOPY WITH PROPOFOL ;  Surgeon: Kenney Peacemaker, MD;  Location: [facility] ENDOSCOPY;  Service: Gastroenterology;  Laterality: N/A;   ENDARTERECTOMY FEMORAL Right 08/20/2020   Procedure: ENDARTERECTOMY  RIGHT FEMORAL ARTERY;  Surgeon: Young Hensen, MD;  Location: [facility] OR;  Service: Vascular;  Laterality: Right;   ENTEROSCOPY N/A 10/11/2021   Procedure: ENTEROSCOPY;  Surgeon: Urban Garden, MD;  Location: [facility] ENDO SUITE;  Service: Gastroenterology;  Laterality: N/A;   ENTEROSCOPY N/A 02/18/2023   Procedure: ENTEROSCOPY;  Surgeon: Suzette Espy, MD;  Location: [facility] ENDO SUITE;  Service: Endoscopy;  Laterality: N/A;  EGD with possible small bowel push enteroscopy   ESOPHAGOGASTRODUODENOSCOPY N/A 09/16/2021   Procedure: ESOPHAGOGASTRODUODENOSCOPY (EGD);  Surgeon: Kenney Peacemaker, MD;  Location: [facility] ENDOSCOPY;  Service: Gastroenterology;  Laterality: N/A;   ESOPHAGOGASTRODUODENOSCOPY (EGD) WITH PROPOFOL  N/A 09/06/2020   Procedure: ESOPHAGOGASTRODUODENOSCOPY (EGD) WITH PROPOFOL ;  Surgeon: Tobin Forts, MD;  Location: [facility] ENDOSCOPY;  Service: Endoscopy;  Laterality: N/A;   ESOPHAGOGASTRODUODENOSCOPY (EGD) WITH PROPOFOL  N/A 10/11/2021   Procedure: ESOPHAGOGASTRODUODENOSCOPY (EGD) WITH PROPOFOL ;  Surgeon: Urban Garden, MD;  Location: [facility] ENDO SUITE;  Service: Gastroenterology;  Laterality: N/A;   ESOPHAGOGASTRODUODENOSCOPY (EGD) WITH PROPOFOL  N/A 02/18/2023   Procedure: ESOPHAGOGASTRODUODENOSCOPY (EGD) WITH PROPOFOL ;  Surgeon: Suzette Espy, MD;  Location: [facility] ENDO SUITE;  Service: Endoscopy;  Laterality: N/A;   GIVENS CAPSULE STUDY N/A 02/19/2023   Procedure: GIVENS CAPSULE STUDY;  Surgeon:  Hargis Lias, MD;  Location: [facility] ENDO SUITE;  Service: Endoscopy;  Laterality: N/A;   HEMOSTASIS CLIP PLACEMENT  09/16/2021   Procedure: HEMOSTASIS CLIP PLACEMENT;  Surgeon: Kenney Peacemaker, MD;  Location: [facility] ENDOSCOPY;  Service: Gastroenterology;;   HOT HEMOSTASIS N/A 09/16/2021   Procedure: HOT HEMOSTASIS (ARGON PLASMA COAGULATION/BICAP);  Surgeon: Kenney Peacemaker, MD;  Location: [facility] ENDOSCOPY;  Service: Gastroenterology;  Laterality: N/A;   HOT HEMOSTASIS  10/11/2021   Procedure: HOT HEMOSTASIS (ARGON PLASMA COAGULATION/BICAP);  Surgeon: Umberto Ganong, Bearl Limes, MD;  Location: [facility] ENDO SUITE;  Service: Gastroenterology;;   HOT HEMOSTASIS  02/18/2023   Procedure: HOT HEMOSTASIS (ARGON PLASMA COAGULATION/BICAP);  Surgeon: Suzette Espy, MD;  Location: [facility] ENDO SUITE;  Service: Endoscopy;;   INSERTION OF ILIAC STENT Right 08/20/2020   Procedure: RETROGRADE INSERTION OF RIGHT ILIAC STENT;  Surgeon: Young Hensen, MD;  Location: [facility] OR;  Service: Vascular;  Laterality: Right;   INTRAOPERATIVE ARTERIOGRAM Right 08/20/2020   Procedure: INTRA OPERATIVE ARTERIOGRAM ILIAC;  Surgeon: Young Hensen, MD;  Location: [facility] OR;  Service: Vascular;  Laterality: Right;   PATCH ANGIOPLASTY Right 08/20/2020   Procedure: PATCH ANGIOPLASTY RIGHT FEMORAL ARTERY;  Surgeon: Young Hensen, MD;  Location: [facility] OR;  Service: Vascular;  Laterality: Right;   PERIPHERAL VASCULAR BALLOON ANGIOPLASTY Right 05/30/2021   Procedure: PERIPHERAL VASCULAR BALLOON ANGIOPLASTY;  Surgeon: Young Hensen,  MD;  Location: [facility] INVASIVE CV LAB;  Service: Cardiovascular;  Laterality: Right;   PERIPHERAL VASCULAR INTERVENTION Right 02/05/2023   Procedure: PERIPHERAL VASCULAR INTERVENTION;  Surgeon: Young Hensen, MD;  Location: [facility] INVASIVE CV LAB;  Service: Cardiovascular;  Laterality: Right;  Rt SFA and Rt Ext Iliac   PORTACATH PLACEMENT Left 06/13/2016   Procedure: INSERTION PORT-A-CATH;  Surgeon: Alanda Allegra, MD;  Location: [facility] ORS;  Service: General;  Laterality: Left;   TRANSURETHRAL RESECTION OF PROSTATE N/A 05/31/2020   Procedure: TRANSURETHRAL RESECTION OF THE PROSTATE (TURP);  Surgeon: Marco Severs, MD;  Location: [facility] ORS;  Service: Urology;  Laterality: N/A;   VIDEO BRONCHOSCOPY WITH ENDOBRONCHIAL NAVIGATION N/A 05/28/2016   Procedure: VIDEO BRONCHOSCOPY WITH ENDOBRONCHIAL NAVIGATION;  Surgeon: Zelphia Higashi, MD;  Location: [facility] OR;  Service: Thoracic;  Laterality: N/A;   VIDEO BRONCHOSCOPY WITH ENDOBRONCHIAL NAVIGATION Bilateral 08/03/2023   Procedure: VIDEO BRONCHOSCOPY WITH ENDOBRONCHIAL NAVIGATION;  Surgeon: Denson Flake, MD;  Location: [facility] ENDOSCOPY;  Service: Pulmonary;  Laterality: Bilateral;  WITH FLUORO   VIDEO BRONCHOSCOPY WITH ENDOBRONCHIAL ULTRASOUND N/A 05/28/2016   Procedure: VIDEO BRONCHOSCOPY WITH ENDOBRONCHIAL ULTRASOUND;  Surgeon: Zelphia Higashi, MD;  Location: [facility] OR;  Service: Thoracic;  Laterality: N/A;    Family History  Problem Relation Age of Onset   Hypertension Mother    Diabetes Father    Heart disease Father    Stroke Father    Hypertension Sister    Sleep apnea Neg Hx     SOCIAL HISTORY: Social History   Tobacco Use   Smoking status: Every Day    Current packs/day: 0.50    Average packs/day: 0.5 packs/day for 62.5 years (31.2 ttl pk-yrs)    Types: Cigarettes    Start date: 03/11/1961   Smokeless tobacco: Never   Tobacco comments:    1/2 pack to 1 pack per day- 07/30/2023, informed to not smoke 24 hrs prior to procedure.  Substance Use Topics   Alcohol use: No    Allergies  Allergen Reactions   Bisoprolol  Other (See Comments)    Confusion, delirium    Sulfa  Antibiotics Swelling    Mouth swelling   Atorvastatin Other (See Comments)    Muscle aches - tolerating Pravastatin  40 mg MWF   Jardiance  [Empagliflozin ] Other (See Comments)    FEELS SLUGGISH, TIRED   Lopressor  [Metoprolol ] Other (See Comments)    Fatigue    Rosuvastatin Other (See Comments)    Muscle aches - tolerating Pravastatin  40 mg MWF   Doxycycline  Nausea Only   Temazepam  Other (See Comments)    Made insomnia worse    Tramadol  Itching    Current Outpatient Medications  Medication Sig Dispense Refill   albuterol  (VENTOLIN  HFA) 108 (90 Base) MCG/ACT inhaler INHALE 2 PUFFS INTO THE LUNGS EVERY 6 HOURS AS NEEDED FOR WHEEZE OR SHORTNESS OF BREATH 8.5 each 5   allopurinol  (ZYLOPRIM ) 300 MG tablet Take 1 tablet (300 mg total) by mouth daily. 90 tablet 0   Blood Glucose Monitoring Suppl (ONETOUCH VERIO REFLECT) w/Device KIT Use to test blood sugars daily as directed. DX: E11.9 1 kit 1   clopidogrel  (PLAVIX ) 75 MG tablet Take 1 tablet (75 mg total) by mouth daily. 30 tablet 11   Continuous Glucose Receiver (FREESTYLE LIBRE 3 READER) DEVI 1 Device by Does not apply route every 14 (fourteen) days. 1 each 3   Continuous Glucose Sensor (FREESTYLE LIBRE 3 SENSOR) MISC 1 Device by Does not apply route continuous. 1 each 2  diltiazem  (CARDIZEM  CD) 120 MG 24 hr capsule Take 1 capsule (120 mg total) by mouth daily as needed. 90 capsule 3   docusate sodium  (COLACE) 100 MG capsule Take 1 capsule (100 mg total) by mouth daily as needed for mild constipation.     dutasteride  (AVODART ) 0.5 MG capsule Take 1 capsule (0.5 mg total) by mouth daily. 90 capsule 3   famotidine  (PEPCID ) 20 MG tablet Take 1 tablet (20 mg total) by mouth daily. 30 tablet 1   ferrous sulfate  325 (65 FE) MG EC tablet Take 1 tablet (325 mg total) by mouth daily.     Fluticasone -Umeclidin-Vilant (TRELEGY ELLIPTA ) 100-62.5-25 MCG/ACT AEPB Inhale 1 puff into the lungs daily. 1 each 11   gabapentin  (NEURONTIN ) 300 MG capsule Take 1 capsule (300 mg total) by mouth 3 (three) times daily. 270 capsule 2   insulin  degludec (TRESIBA  FLEXTOUCH) 100 UNIT/ML FlexTouch Pen Inject 20 Units into the skin at bedtime. 18 mL 1   Insulin  Pen Needle (BD PEN NEEDLE NANO U/F) 32G X 4 MM MISC Use to inject  insulin  once daily 100 each 3   levothyroxine  (SYNTHROID ) 50 MCG tablet Take 1 tablet (50 mcg total) by mouth daily. 90 tablet 1   linaclotide  (LINZESS ) 72 MCG capsule Take 1 capsule (72 mcg total) by mouth daily before breakfast. 90 capsule 1   megestrol  (MEGACE ) 400 MG/10ML suspension Take 10 mLs (400 mg total) by mouth 2 (two) times daily. 480 mL 3   metolazone  (ZAROXOLYN ) 2.5 MG tablet Take 1 tablet (2.5 mg total) by mouth 3 (three) times a week. 45 tablet 2   mupirocin ointment (BACTROBAN) 2 % Apply topically 2 (two) times daily.     NON FORMULARY CPAP at bedtime     ondansetron  (ZOFRAN ) 4 MG tablet Take 1 tablet (4 mg total) by mouth every 8 (eight) hours as needed for nausea or vomiting. 90 tablet 1   OneTouch Delica Lancets 30G MISC Use to test blood sugars daily as directed. DX: E11.9 100 each 1   OXYGEN  Inhale 2 L into the lungs daily as needed (with Cpap at night).     pantoprazole  (PROTONIX ) 40 MG tablet Take 1 tablet (40 mg total) by mouth daily. 90 tablet 0   pravastatin  (PRAVACHOL ) 40 MG tablet TAKE 1 TABLET (40 MG TOTAL) BY MOUTH ON MONDAYS , WEDNESDAYS, AND FRIDAYS AS DIRECTED 48 tablet 4   predniSONE  (STERAPRED UNI-PAK 21 TAB) 10 MG (21) TBPK tablet Use as directed 21 tablet 0   propranolol  (INDERAL ) 10 MG tablet Take 1 tablet (10 mg total) by mouth 3 (three) times daily as needed. 90 tablet 6   spironolactone  (ALDACTONE ) 25 MG tablet Take 1 tablet (25 mg total) by mouth 2 (two) times daily. 180 tablet 3   torsemide  (DEMADEX ) 20 MG tablet Take 0.5 tablets (10 mg total) by mouth daily. 90 tablet 2   zolpidem  (AMBIEN ) 5 MG tablet Take 1 tablet (5 mg total) by mouth at bedtime as needed for sleep. 30 tablet 2   No current facility-administered medications for this visit.    REVIEW OF SYSTEMS:  [X]  denotes positive finding, [ ]  denotes negative finding Cardiac  Comments:  Chest pain or chest pressure:    Shortness of breath upon exertion:    Short of breath when lying flat:     Irregular heart rhythm:        Vascular    Pain in calf, thigh, or hip brought on by ambulation:  Pain in feet at night that wakes you up from your sleep:     Blood clot in your veins:    Leg swelling:         Pulmonary    Oxygen  at home:    Productive cough:     Wheezing:         Neurologic    Sudden weakness in arms or legs:     Sudden numbness in arms or legs:     Sudden onset of difficulty speaking or slurred speech:    Temporary loss of vision in one eye:     Problems with dizziness:         Gastrointestinal    Blood in stool:     Vomited blood:         Genitourinary    Burning when urinating:     Blood in urine:        Psychiatric    Major depression:         Hematologic    Bleeding problems:    Problems with blood clotting too easily:        Skin    Rashes or ulcers:        Constitutional    Fever or chills:      PHYSICAL EXAM: There were no vitals filed for this visit.   GENERAL: The patient is a well-nourished male, in no acute distress. The vital signs are documented above. CARDIAC: There is a regular rate and rhythm.  VASCULAR:  Palpable femoral pulses bilaterally Right DP palpable Ulcer on back of right heel as pictured that is clean with some granulation    DATA:   ABIs today improved and now 0.97 right biphasic and 0.84 left monophasic   Aortoiliac duplex today shows some stenosis right common iliac artery stent with velocity 310 and patent external iliac stent  Right lower extremity arterial duplex today shows patent SFA stents without stenosis   Assessment/Plan:  78 year old male status post right common femoral endarterectomy with profundoplasty and bovine pericardial patch angioplasty and right common iliac stent on 08/20/2020 for short distance lifestyle limiting claudication.  He developed recurrent calf claudication and started back smoking.  Ultimately then underwent right common iliac angioplasty with DCB as well as right SFA  and above-knee popliteal angioplasty with DCB on 05/30/2021.   Most recently pn 02/05/2023 he underwent right SFA angioplasty with stent placement for a 90% stenosis and right external iliac artery angioplasty and stent placement for 60 to 70% stenosis.  He presents for 6 month interval follow-up.  Unfortunately has developed a small ulcer on the back of his right heel.  Fortunately his stents are patent and he has a palpable DP pulse in the right foot.  Should have adequate inflow for wound healing as we discussed today.  This is being followed by podiatry.  I will follow the stenosis in the right common iliac stent and discussed I will see him again in 3 months with repeat studies.  I have asked that he stay on Plavix  and statin from my standpoint if able.     Young Hensen, MD [facility] Office: [phone]

## 2023-09-09 ENCOUNTER — Inpatient Hospital Stay: Attending: Hematology

## 2023-09-09 ENCOUNTER — Inpatient Hospital Stay

## 2023-09-09 ENCOUNTER — Other Ambulatory Visit: Payer: Self-pay | Admitting: Vascular Surgery

## 2023-09-09 ENCOUNTER — Other Ambulatory Visit: Payer: Self-pay

## 2023-09-09 ENCOUNTER — Other Ambulatory Visit

## 2023-09-09 ENCOUNTER — Emergency Department (HOSPITAL_COMMUNITY)
Admission: EM | Admit: 2023-09-09 | Discharge: 2023-09-09 | Disposition: A | Discharge status: Home / Self Care | Attending: Emergency Medicine | Admitting: Emergency Medicine

## 2023-09-09 VITALS — BP 160/81 | HR 99 | Temp 97.1°F | Resp 19 | Wt 152.2 lb

## 2023-09-09 DIAGNOSIS — E1142 Type 2 diabetes mellitus with diabetic polyneuropathy: Secondary | ICD-10-CM | POA: Diagnosis not present

## 2023-09-09 DIAGNOSIS — Z7952 Long term (current) use of systemic steroids: Secondary | ICD-10-CM | POA: Insufficient documentation

## 2023-09-09 DIAGNOSIS — I509 Heart failure, unspecified: Secondary | ICD-10-CM | POA: Insufficient documentation

## 2023-09-09 DIAGNOSIS — Z1509 Genetic susceptibility to other malignant neoplasm: Secondary | ICD-10-CM | POA: Insufficient documentation

## 2023-09-09 DIAGNOSIS — Z79899 Other long term (current) drug therapy: Secondary | ICD-10-CM | POA: Diagnosis not present

## 2023-09-09 DIAGNOSIS — I110 Hypertensive heart disease with heart failure: Secondary | ICD-10-CM | POA: Insufficient documentation

## 2023-09-09 DIAGNOSIS — Z7951 Long term (current) use of inhaled steroids: Secondary | ICD-10-CM | POA: Insufficient documentation

## 2023-09-09 DIAGNOSIS — Z5111 Encounter for antineoplastic chemotherapy: Secondary | ICD-10-CM | POA: Insufficient documentation

## 2023-09-09 DIAGNOSIS — Z5112 Encounter for antineoplastic immunotherapy: Secondary | ICD-10-CM | POA: Diagnosis not present

## 2023-09-09 DIAGNOSIS — I5032 Chronic diastolic (congestive) heart failure: Secondary | ICD-10-CM | POA: Diagnosis not present

## 2023-09-09 DIAGNOSIS — Z7902 Long term (current) use of antithrombotics/antiplatelets: Secondary | ICD-10-CM | POA: Insufficient documentation

## 2023-09-09 DIAGNOSIS — C3411 Malignant neoplasm of upper lobe, right bronchus or lung: Secondary | ICD-10-CM | POA: Insufficient documentation

## 2023-09-09 DIAGNOSIS — R5383 Other fatigue: Secondary | ICD-10-CM | POA: Diagnosis not present

## 2023-09-09 DIAGNOSIS — R059 Cough, unspecified: Secondary | ICD-10-CM | POA: Insufficient documentation

## 2023-09-09 DIAGNOSIS — C3492 Malignant neoplasm of unspecified part of left bronchus or lung: Secondary | ICD-10-CM

## 2023-09-09 DIAGNOSIS — Z452 Encounter for adjustment and management of vascular access device: Secondary | ICD-10-CM | POA: Diagnosis not present

## 2023-09-09 DIAGNOSIS — Z5189 Encounter for other specified aftercare: Secondary | ICD-10-CM | POA: Diagnosis not present

## 2023-09-09 DIAGNOSIS — T424X5A Adverse effect of benzodiazepines, initial encounter: Secondary | ICD-10-CM | POA: Diagnosis not present

## 2023-09-09 DIAGNOSIS — D539 Nutritional anemia, unspecified: Secondary | ICD-10-CM | POA: Insufficient documentation

## 2023-09-09 DIAGNOSIS — E039 Hypothyroidism, unspecified: Secondary | ICD-10-CM | POA: Insufficient documentation

## 2023-09-09 DIAGNOSIS — R000 Tachycardia, unspecified: Secondary | ICD-10-CM | POA: Diagnosis not present

## 2023-09-09 DIAGNOSIS — Z794 Long term (current) use of insulin: Secondary | ICD-10-CM | POA: Insufficient documentation

## 2023-09-09 DIAGNOSIS — T450X5A Adverse effect of antiallergic and antiemetic drugs, initial encounter: Secondary | ICD-10-CM | POA: Diagnosis not present

## 2023-09-09 DIAGNOSIS — J449 Chronic obstructive pulmonary disease, unspecified: Secondary | ICD-10-CM | POA: Diagnosis not present

## 2023-09-09 DIAGNOSIS — I739 Peripheral vascular disease, unspecified: Secondary | ICD-10-CM

## 2023-09-09 DIAGNOSIS — C3412 Malignant neoplasm of upper lobe, left bronchus or lung: Secondary | ICD-10-CM | POA: Insufficient documentation

## 2023-09-09 DIAGNOSIS — E119 Type 2 diabetes mellitus without complications: Secondary | ICD-10-CM | POA: Diagnosis not present

## 2023-09-09 DIAGNOSIS — T82398A Other mechanical complication of other vascular grafts, initial encounter: Secondary | ICD-10-CM

## 2023-09-09 DIAGNOSIS — C3431 Malignant neoplasm of lower lobe, right bronchus or lung: Secondary | ICD-10-CM | POA: Diagnosis not present

## 2023-09-09 DIAGNOSIS — E1165 Type 2 diabetes mellitus with hyperglycemia: Secondary | ICD-10-CM | POA: Diagnosis not present

## 2023-09-09 DIAGNOSIS — J441 Chronic obstructive pulmonary disease with (acute) exacerbation: Secondary | ICD-10-CM | POA: Diagnosis not present

## 2023-09-09 DIAGNOSIS — Z85118 Personal history of other malignant neoplasm of bronchus and lung: Secondary | ICD-10-CM | POA: Insufficient documentation

## 2023-09-09 DIAGNOSIS — F17200 Nicotine dependence, unspecified, uncomplicated: Secondary | ICD-10-CM | POA: Insufficient documentation

## 2023-09-09 DIAGNOSIS — Z79620 Long term (current) use of immunosuppressive biologic: Secondary | ICD-10-CM | POA: Diagnosis not present

## 2023-09-09 DIAGNOSIS — T50905A Adverse effect of unspecified drugs, medicaments and biological substances, initial encounter: Secondary | ICD-10-CM

## 2023-09-09 LAB — CBC WITH DIFFERENTIAL/PLATELET
Abs Immature Granulocytes: 0.5 10*3/uL — ABNORMAL HIGH (ref 0.00–0.07)
Basophils Absolute: 0.1 10*3/uL (ref 0.0–0.1)
Basophils Relative: 0 %
Eosinophils Absolute: 0 10*3/uL (ref 0.0–0.5)
Eosinophils Relative: 0 %
HCT: 36.2 % — ABNORMAL LOW (ref 39.0–52.0)
Hemoglobin: 11.8 g/dL — ABNORMAL LOW (ref 13.0–17.0)
Immature Granulocytes: 2 %
Lymphocytes Relative: 3 %
Lymphs Abs: 0.6 10*3/uL — ABNORMAL LOW (ref 0.7–4.0)
MCH: 33.5 pg (ref 26.0–34.0)
MCHC: 32.6 g/dL (ref 30.0–36.0)
MCV: 102.8 fL — ABNORMAL HIGH (ref 80.0–100.0)
Monocytes Absolute: 1.4 10*3/uL — ABNORMAL HIGH (ref 0.1–1.0)
Monocytes Relative: 6 %
Neutro Abs: 18.8 10*3/uL — ABNORMAL HIGH (ref 1.7–7.7)
Neutrophils Relative %: 89 %
Platelets: 531 10*3/uL — ABNORMAL HIGH (ref 150–400)
RBC: 3.52 MIL/uL — ABNORMAL LOW (ref 4.22–5.81)
RDW: 16.5 % — ABNORMAL HIGH (ref 11.5–15.5)
WBC: 21.3 10*3/uL — ABNORMAL HIGH (ref 4.0–10.5)
nRBC: 0.2 % (ref 0.0–0.2)

## 2023-09-09 LAB — COMPREHENSIVE METABOLIC PANEL WITH GFR
ALT: 11 U/L (ref 0–44)
AST: 11 U/L — ABNORMAL LOW (ref 15–41)
Albumin: 3.2 g/dL — ABNORMAL LOW (ref 3.5–5.0)
Alkaline Phosphatase: 74 U/L (ref 38–126)
Anion gap: 8 (ref 5–15)
BUN: 22 mg/dL (ref 8–23)
CO2: 28 mmol/L (ref 22–32)
Calcium: 9.2 mg/dL (ref 8.9–10.3)
Chloride: 99 mmol/L (ref 98–111)
Creatinine, Ser: 0.85 mg/dL (ref 0.61–1.24)
GFR, Estimated: >60 mL/min (ref >60)
Glucose, Bld: 205 mg/dL — ABNORMAL HIGH (ref 70–99)
Potassium: 4.3 mmol/L (ref 3.5–5.1)
Sodium: 135 mmol/L (ref 135–145)
Total Bilirubin: 0.5 mg/dL (ref 0.0–1.2)
Total Protein: 7.3 g/dL (ref 6.5–8.1)

## 2023-09-09 LAB — BLOOD GAS, VENOUS
Acid-Base Excess: 3.3 mmol/L — ABNORMAL HIGH (ref 0.0–2.0)
Bicarbonate: 29.1 mmol/L — ABNORMAL HIGH (ref 20.0–28.0)
Drawn by: 7012
FIO2: 28 %
O2 Saturation: 78.6 %
Patient temperature: 36.2
pCO2, Ven: 46 mmHg (ref 44–60)
pH, Ven: 7.4 (ref 7.25–7.43)
pO2, Ven: 42 mmHg (ref 32–45)

## 2023-09-09 LAB — TSH: TSH: 2.256 u[IU]/mL (ref 0.350–4.500)

## 2023-09-09 MED ORDER — HEPARIN SOD (PORK) LOCK FLUSH 100 UNIT/ML IV SOLN
500.0000 [IU] | Freq: Once | INTRAVENOUS | Status: DC | PRN
Start: 2023-09-09 — End: 2023-09-09

## 2023-09-09 MED ORDER — SODIUM CHLORIDE 0.9 % IV SOLN
462.0000 mg | Freq: Once | INTRAVENOUS | Status: AC
  Administered 2023-09-09: 460 mg via INTRAVENOUS
  Filled 2023-09-09: qty 46

## 2023-09-09 MED ORDER — FAMOTIDINE IN NACL 20-0.9 MG/50ML-% IV SOLN
20.0000 mg | Freq: Once | INTRAVENOUS | Status: AC
  Administered 2023-09-09: 20 mg via INTRAVENOUS
  Filled 2023-09-09: qty 50

## 2023-09-09 MED ORDER — SODIUM CHLORIDE 0.9 % IV SOLN
360.0000 mg | Freq: Once | INTRAVENOUS | Status: AC
  Administered 2023-09-09: 360 mg via INTRAVENOUS
  Filled 2023-09-09: qty 24

## 2023-09-09 MED ORDER — INSULIN ASPART 100 UNIT/ML IJ SOLN
10.0000 [IU] | Freq: Once | INTRAMUSCULAR | Status: AC
  Administered 2023-09-09: 10 [IU] via SUBCUTANEOUS
  Filled 2023-09-09: qty 1

## 2023-09-09 MED ORDER — DIPHENHYDRAMINE HCL 50 MG/ML IJ SOLN
50.0000 mg | Freq: Once | INTRAMUSCULAR | Status: AC
  Administered 2023-09-09: 50 mg via INTRAVENOUS
  Filled 2023-09-09: qty 1

## 2023-09-09 MED ORDER — PALONOSETRON HCL INJECTION 0.25 MG/5ML
0.2500 mg | Freq: Once | INTRAVENOUS | Status: AC
  Administered 2023-09-09: 0.25 mg via INTRAVENOUS
  Filled 2023-09-09: qty 5

## 2023-09-09 MED ORDER — HEPARIN SOD (PORK) LOCK FLUSH 100 UNIT/ML IV SOLN
500.0000 [IU] | Freq: Once | INTRAVENOUS | Status: AC
  Administered 2023-09-09: 500 [IU] via INTRAVENOUS
  Filled 2023-09-09: qty 5

## 2023-09-09 MED ORDER — DEXAMETHASONE SODIUM PHOSPHATE 10 MG/ML IJ SOLN
10.0000 mg | Freq: Once | INTRAMUSCULAR | Status: AC
  Administered 2023-09-09: 10 mg via INTRAVENOUS
  Filled 2023-09-09: qty 1

## 2023-09-09 MED ORDER — SODIUM CHLORIDE 0.9 % IV SOLN
140.0000 mg/m2 | Freq: Once | INTRAVENOUS | Status: AC
  Administered 2023-09-09: 252 mg via INTRAVENOUS
  Filled 2023-09-09: qty 42

## 2023-09-09 MED ORDER — SODIUM CHLORIDE 0.9 % IV SOLN
1.0000 mg/kg | Freq: Once | INTRAVENOUS | Status: AC
  Administered 2023-09-09: 70 mg via INTRAVENOUS
  Filled 2023-09-09: qty 14

## 2023-09-09 MED ORDER — APREPITANT 130 MG/18ML IV EMUL
130.0000 mg | Freq: Once | INTRAVENOUS | Status: AC
  Administered 2023-09-09: 130 mg via INTRAVENOUS
  Filled 2023-09-09: qty 18

## 2023-09-09 MED ORDER — LORAZEPAM 2 MG/ML IJ SOLN
0.5000 mg | Freq: Once | INTRAMUSCULAR | Status: AC
  Administered 2023-09-09: 0.5 mg via INTRAVENOUS
  Filled 2023-09-09: qty 1

## 2023-09-09 MED ORDER — SODIUM CHLORIDE 0.9 % IV SOLN: INTRAVENOUS | Status: DC

## 2023-09-09 NOTE — ED Triage Notes (Signed)
 Pt arrived from the cancer center where he had his first Tx, was given 50mg  of benadryl  and then followed by 0.25 ativan . RN states pt has been harder to arouse and restless. Hx of sleep apnea and on oxygen  for comfort

## 2023-09-09 NOTE — Progress Notes (Signed)
 Labs reviewed with MD and treatment team. Per MD to proceed with treatment. Per pt he was in the hospital for fluid overload and was placed on prednisone  recently. Per pt he feels better but still feels weak and SOB when he ambulates. Per pt he has oxygen  at home if needed. MD made aware. Per MD proceed with treatment today. Pt updated and agrees to plan.   Premedications given as ordered. Benadryl  50 mg IV was given at 0848 and at 0855 Pt expresses to RN that he was having extreme pain in both of his legs. Pt appears restless and in pain. Pt moaning and unable to get comfortable with his leg pain. Vitals stable. MD made aware of patients discomfort. Per MD to give ativan  0.5mg . pt updated and agrees to plan. Ativan  given per orders at 0945. RN and treatment team relocated patient closer to nursing station and helped pt transition to bed to help pt get comfortable better. Call light within reach.    At 1100 MD was notified of patient's extreme drowsiness and poor alertness. Pt is very difficult to wake up at this time. RN notified MD and treatment team and ask them to lay eyes on patient before proceeding with treatment. Per MD to proceed with treatment. Vitals stable.   Treatment started per protocol. Pt resting well in bed but still difficult to arouse. Oxygen  2 L placed on patient for comfort.   Carboplatin  completed at 1613. Pt tolerated opdivo, yervoy, taxol , and carboplatin  well with no issues. Vitals stay stable throughout infusions.   After carboplatin , pt still very lethargic but is arousable.  Pt alert and oriented to person and place. Prior to benadryl  patient was alert and oriented X3 and needed minimal assistance with mobility. When pt is awake he states he has no pain but wants to sleep. Vitals stable. MD at chairside to assess pt. Per MD may go to the ER to be assessed and observed longer. Pt.'s wife, [name], agrees to plan.   Report given to [facility].  At 1625 Pt transported to ER via  stretcher with nursing staff without any complications. All belongings given to pt.'s wife. Port left accessed for ER.   Elin Seats [employer]

## 2023-09-09 NOTE — Progress Notes (Signed)
 Patient reports last dose of prednisone  sterapred tonight per RN.  Ok to proceed with Dr Cheree Cords.  Augie Bliss, PharmD

## 2023-09-09 NOTE — ED Notes (Addendum)
 Pt ambulated to restroom with assistance. Pt oxygen  88% and HR 133 after returning to bed and having a hard time catching his breath. Pt bumped up to 2 L of O2 and currently sitting up. Vitals came back to 95% SPO2 and HR 114. MD and paramedic notified.

## 2023-09-09 NOTE — ED Provider Notes (Signed)
 [place] EMERGENCY DEPARTMENT AT [facility] Provider Note   CSN: [id number] Arrival date & time: 09/09/23  1636     History  Chief Complaint  Patient presents with   Allergic Reaction    John [name] is a 78 y.o. male.  Pt is a 78 yo male with pmhx significant for HLD, COPD (2L oxygen  prn), sleep apnea, anxiety/depression, arthritis, HTN, CHF, kidney stones, hypothyroidism, DM2, lung cancer, PAD, and GERD.  Pt went to the cancer center today to receive his treatment.  He received 50 mg benadryl  at 0848 and 0.5 mg ativan  at 0945.  Pt remained very drowsy all day and did not wake up enough to go home, so he was sent to the ED for observation.  Pt will wake up briefly, but is unable to give me any hx.  Pt's wife said he has been on prednisone  and has had very little sleep for the past few days.              Home Medications Prior to Admission medications   Medication Sig Start Date End Date Taking? Authorizing Provider  albuterol  (VENTOLIN  HFA) 108 (90 Base) MCG/ACT inhaler INHALE 2 PUFFS INTO THE LUNGS EVERY 6 HOURS AS NEEDED FOR WHEEZE OR SHORTNESS OF BREATH 09/09/23   Hawks, Kathaleen Pale A, FNP  allopurinol  (ZYLOPRIM ) 300 MG tablet Take 1 tablet (300 mg total) by mouth daily. 09/04/23   Tommas Fragmin A, FNP  Blood Glucose Monitoring Suppl (ONETOUCH VERIO REFLECT) w/Device KIT Use to test blood sugars daily as directed. DX: E11.9 10/14/19   Tommas Fragmin A, FNP  clopidogrel  (PLAVIX ) 75 MG tablet Take 1 tablet (75 mg total) by mouth daily. 02/05/23 02/05/24  Young Hensen, MD  Continuous Glucose Receiver (FREESTYLE LIBRE 3 READER) DEVI 1 Device by Does not apply route every 14 (fourteen) days. 08/11/22   Yevette Hem, FNP  Continuous Glucose Sensor (FREESTYLE LIBRE 3 SENSOR) MISC 1 Device by Does not apply route continuous. 08/11/22   Tommas Fragmin A, FNP  diltiazem  (CARDIZEM  CD) 120 MG 24 hr capsule Take 1 capsule (120 mg total) by mouth daily as needed. 08/19/23    Tylene Galla, PA-C  docusate sodium  (COLACE) 100 MG capsule Take 1 capsule (100 mg total) by mouth daily as needed for mild constipation. 05/31/23 05/30/24  Audria Leather, MD  dutasteride  (AVODART ) 0.5 MG capsule Take 1 capsule (0.5 mg total) by mouth daily. 10/31/22   McKenzie, Arden Beck, MD  famotidine  (PEPCID ) 20 MG tablet Take 1 tablet (20 mg total) by mouth daily. 02/19/23 05/29/24  Mason Sole, Pratik D, DO  ferrous sulfate  325 (65 FE) MG EC tablet Take 1 tablet (325 mg total) by mouth daily. 05/31/23 05/30/24  Audria Leather, MD  Fluticasone -Umeclidin-Vilant (TRELEGY ELLIPTA ) 100-62.5-25 MCG/ACT AEPB TAKE 1 PUFF BY MOUTH EVERY DAY 09/09/23   Tommas Fragmin A, FNP  gabapentin  (NEURONTIN ) 300 MG capsule Take 1 capsule (300 mg total) by mouth 3 (three) times daily. 09/04/23   Yevette Hem, FNP  insulin  degludec (TRESIBA  FLEXTOUCH) 100 UNIT/ML FlexTouch Pen Inject 20 Units into the skin at bedtime. 08/24/23   Wendel Hals, NP  Insulin  Pen Needle (BD PEN NEEDLE NANO U/F) 32G X 4 MM MISC Use to inject insulin  once daily 08/24/23   Wendel Hals, NP  levothyroxine  (SYNTHROID ) 50 MCG tablet Take 1 tablet (50 mcg total) by mouth daily. 08/24/23   Wendel Hals, NP  linaclotide  (LINZESS ) 72 MCG capsule Take 1 capsule (  72 mcg total) by mouth daily before breakfast. 04/30/23   Yevette Hem, FNP  megestrol  (MEGACE ) 400 MG/10ML suspension Take 10 mLs (400 mg total) by mouth 2 (two) times daily. 09/04/23   Tommas Fragmin A, FNP  metolazone  (ZAROXOLYN ) 2.5 MG tablet Take 1 tablet (2.5 mg total) by mouth 3 (three) times a week. 08/26/23   O'NealCathay Clonts, MD  mupirocin ointment (BACTROBAN) 2 % Apply topically 2 (two) times daily. 08/27/23   [provider]  NON FORMULARY CPAP at bedtime    [provider]  ondansetron  (ZOFRAN ) 4 MG tablet Take 1 tablet (4 mg total) by mouth every 8 (eight) hours as needed for nausea or vomiting. 04/30/23   Yevette Hem, FNP  OneTouch  Delica Lancets 30G MISC Use to test blood sugars daily as directed. DX: E11.9 02/24/22   Wendel Hals, NP  OXYGEN  Inhale 2 L into the lungs daily as needed (with Cpap at night).    [provider]  pantoprazole  (PROTONIX ) 40 MG tablet Take 1 tablet (40 mg total) by mouth daily. 07/30/23   Tommas Fragmin A, FNP  pravastatin  (PRAVACHOL ) 40 MG tablet TAKE 1 TABLET (40 MG TOTAL) BY MOUTH ON MONDAYS , WEDNESDAYS, AND FRIDAYS AS DIRECTED 07/30/23   Yevette Hem, FNP  predniSONE  (STERAPRED UNI-PAK 21 TAB) 10 MG (21) TBPK tablet Use as directed 09/04/23   Tommas Fragmin A, FNP  propranolol  (INDERAL ) 10 MG tablet Take 1 tablet (10 mg total) by mouth 3 (three) times daily as needed. 04/27/23   Tania Familia, NP  spironolactone  (ALDACTONE ) 25 MG tablet Take 1 tablet (25 mg total) by mouth 2 (two) times daily. 08/19/23   Tania Familia, NP  torsemide  (DEMADEX ) 20 MG tablet TAKE 1/2 TABLET BY MOUTH DAILY 09/09/23   Tommas Fragmin A, FNP  zolpidem  (AMBIEN ) 5 MG tablet Take 1 tablet (5 mg total) by mouth at bedtime as needed for sleep. 09/04/23   Yevette Hem, FNP      Allergies    Benadryl  [diphenhydramine ], Bisoprolol , Sulfa  antibiotics, Atorvastatin, Jardiance  [empagliflozin ], Lopressor  [metoprolol ], Rosuvastatin, Doxycycline , Temazepam , and Tramadol     Review of Systems   Review of Systems  Constitutional:  Positive for fatigue.  All other systems reviewed and are negative.   Physical Exam Updated Vital Signs BP (!) 152/76   Pulse (!) 106   Temp 98.4 F (36.9 C) (Oral)   Resp 20   SpO2 94%  Physical Exam Vitals and nursing note reviewed.  Constitutional:      Appearance: Normal appearance.  HENT:     Head: Normocephalic and atraumatic.     Right Ear: External ear normal.     Left Ear: External ear normal.     Nose: Nose normal.     Mouth/Throat:     Mouth: Mucous membranes are dry.  Eyes:     Conjunctiva/sclera: Conjunctivae normal.     Pupils: Pupils are  equal, round, and reactive to light.  Cardiovascular:     Rate and Rhythm: Normal rate and regular rhythm.     Pulses: Normal pulses.     Heart sounds: Normal heart sounds.  Pulmonary:     Effort: Pulmonary effort is normal.     Breath sounds: Normal breath sounds.  Abdominal:     General: Abdomen is flat. Bowel sounds are normal.     Palpations: Abdomen is soft.  Musculoskeletal:        General: Normal range of motion.  Cervical back: Normal range of motion and neck supple.  Skin:    General: Skin is warm.     Capillary Refill: Capillary refill takes less than 2 seconds.  Neurological:     General: No focal deficit present.     Mental Status: He is oriented to person, place, and time. He is lethargic.  Psychiatric:        Mood and Affect: Mood normal.        Behavior: Behavior normal.     ED Results / Procedures / Treatments   Labs (all labs ordered are listed, but only abnormal results are displayed) Labs Reviewed  BLOOD GAS, VENOUS - Abnormal; Notable for the following components:      Result Value   Bicarbonate 29.1 (*)    Acid-Base Excess 3.3 (*)    All other components within normal limits    EKG None  Radiology VAS US  LOWER EXTREMITY ARTERIAL DUPLEX Result Date: 09/08/2023 LOWER EXTREMITY ARTERIAL DUPLEX STUDY Patient Name:  SALBADOR FIVEASH  Date of Exam:   09/08/2023 Medical Rec #: [id number]         Accession #:    [id number] Date of Birth: 1945/08/08         Patient Gender: M Patient Age:   17 years Exam Location:  [address] Procedure:      VAS US  LOWER EXTREMITY ARTERIAL DUPLEX Referring Phys: Jimmye Moulds --------------------------------------------------------------------------------  High Risk Factors: Hypertension, hyperlipidemia, Diabetes, current smoker.  Vascular Interventions: 08/20/20: Angioplasty and stent right CIA.                         Right CFA endarterectomy, profundoplasty and pericardial                         patch angioplasty.                          05/30/21: Right prox SFA, dist SFA, and AK pop DCBA.                         Right prox CIA DCBA                         02/05/23: Right angioplasty and stent SFA and EIA. Current ABI:            Right 0.97/0.66 Left 0.84/0.49 Comparison Study: 02/25/23 at [facility] Radiology: No evidence of significant inflow,                   outflow or runoff disease.                   Patent metallic stent present within the SFA Performing Technologist: Helon Lobos RVT  Examination Guidelines: A complete evaluation includes B-mode imaging, spectral Doppler, color Doppler, and power Doppler as needed of all accessible portions of each vessel. Bilateral testing is considered an integral part of a complete examination. Limited examinations for reoccurring indications may be performed as noted.  +-----------+--------+-----+--------+--------+--------+ RIGHT      PSV cm/sRatioStenosisWaveformComments +-----------+--------+-----+--------+--------+--------+ CFA Distal 69                   biphasic         +-----------+--------+-----+--------+--------+--------+ DFA        88  biphasic         +-----------+--------+-----+--------+--------+--------+ SFA Prox   130                  biphasic         +-----------+--------+-----+--------+--------+--------+ SFA Mid                                 stent    +-----------+--------+-----+--------+--------+--------+ SFA Distal                              stent    +-----------+--------+-----+--------+--------+--------+ POP Prox   56                   biphasic         +-----------+--------+-----+--------+--------+--------+ POP Distal 46                   biphasic         +-----------+--------+-----+--------+--------+--------+ ATA Distal 62                   biphasic         +-----------+--------+-----+--------+--------+--------+ PTA Distal 58                   biphasic          +-----------+--------+-----+--------+--------+--------+ PERO Distal42                   biphasic         +-----------+--------+-----+--------+--------+--------+  Right Stent(s): +-----------------+--------+--------+--------+--------+ mid to distal SFAPSV cm/sStenosisWaveformComments +-----------------+--------+--------+--------+--------+ Prox to Stent    96              biphasic         +-----------------+--------+--------+--------+--------+ Proximal Stent   79              biphasic         +-----------------+--------+--------+--------+--------+ Mid Stent        90              biphasic         +-----------------+--------+--------+--------+--------+ Distal Stent     88              biphasic         +-----------------+--------+--------+--------+--------+ Distal to Stent  104             biphasic         +-----------------+--------+--------+--------+--------+  Summary: Right: Patent SFA stent with no stenosis.  See table(s) above for measurements and observations. Electronically signed by Jimmye Moulds MD on 09/08/2023 at 11:00:41 AM.    Final    VAS US  AORTA/IVC/ILIACS Result Date: 09/08/2023 ABDOMINAL AORTA STUDY Patient Name:  ZADRIAN MCCAULEY  Date of Exam:   09/08/2023 Medical Rec #: [id number]         Accession #:    [id number] Date of Birth: 1945/12/03         Patient Gender: M Patient Age:   6 years Exam Location:  [address] Procedure:      VAS US  AORTA/IVC/ILIACS Referring Phys: Jimmye Moulds --------------------------------------------------------------------------------  Risk Factors: Hypertension, Diabetes, current smoker. Vascular Interventions: 08/20/20: Angioplasty and stent right CIA.                         Right CFA endarterectomy, profundoplasty and pericardial  patch angioplasty.                         05/30/21: Right prox SFA, dist SFA, and AK pop DCBA.                         Right prox CIA DCBA                          02/05/23: Right angioplasty and stent SFA and EIA.  Performing Technologist: Helon Lobos RVT  Examination Guidelines: A complete evaluation includes B-mode imaging, spectral Doppler, color Doppler, and power Doppler as needed of all accessible portions of each vessel. Bilateral testing is considered an integral part of a complete examination. Limited examinations for reoccurring indications may be performed as noted.  Abdominal Aorta Findings: +-------------+-------+----------+----------+--------+--------+----------------+ Location     AP (cm)Trans (cm)PSV (cm/s)WaveformThrombusComments         +-------------+-------+----------+----------+--------+--------+----------------+ Distal                        41                                         +-------------+-------+----------+----------+--------+--------+----------------+ RT CIA Prox                   310       biphasic        Inflow or                                                                proximal stent   +-------------+-------+----------+----------+--------+--------+----------------+ RT CIA Mid                    173       biphasic                         +-------------+-------+----------+----------+--------+--------+----------------+ RT CIA Distal                 202       biphasic                         +-------------+-------+----------+----------+--------+--------+----------------+ RT EIA Prox                   178       biphasic                         +-------------+-------+----------+----------+--------+--------+----------------+ RT EIA Mid                    213       biphasic                         +-------------+-------+----------+----------+--------+--------+----------------+ RT EIA Distal                 186       biphasic                         +-------------+-------+----------+----------+--------+--------+----------------+  Summary: Stenosis: >50% stenosis at the  inflow/proximal right CIA stent.   *See table(s) above for measurements and observations.  Electronically signed by Jimmye Moulds MD on 09/08/2023 at 11:00:23 AM.    Final    VAS US  ABI WITH/WO TBI Result Date: 09/08/2023  LOWER EXTREMITY DOPPLER STUDY Patient Name:  SLATER MCMANAMAN  Date of Exam:   09/08/2023 Medical Rec #: [id number]         Accession #:    [id number] Date of Birth: 11/19/1945         Patient Gender: M Patient Age:   4 years Exam Location:  [address] Procedure:      VAS US  ABI WITH/WO TBI Referring Phys: Jimmye Moulds --------------------------------------------------------------------------------  Indications: Peripheral artery disease. High Risk Factors: Hypertension, hyperlipidemia, Diabetes, current smoker.  Vascular Interventions: 08/20/20: Angioplasty and stent right CIA.                         Right CFA endarterectomy, profundoplasty and pericardial                         patch angioplasty.                         05/30/21: Right prox SFA, dist SFA, and AK pop DCBA.                         Right prox CIA DCBA                         02/05/23: Right angioplasty and stent SFA and EIA. Comparison Study: 02/25/23 at [facility] Radiology: No evidence of significant inflow,                   outflow or runoff disease.                   Patent metallic stent present within the SFA. Performing Technologist: Helon Lobos RVT  Examination Guidelines: A complete evaluation includes at minimum, Doppler waveform signals and systolic blood pressure reading at the level of bilateral brachial, anterior tibial, and posterior tibial arteries, when vessel segments are accessible. Bilateral testing is considered an integral part of a complete examination. Photoelectric Plethysmograph (PPG) waveforms and toe systolic pressure readings are included as required and additional duplex testing as needed. Limited examinations for reoccurring indications may be performed as noted.  ABI Findings:  +---------+------------------+-----+--------+--------+ Right    Rt Pressure (mmHg)IndexWaveformComment  +---------+------------------+-----+--------+--------+ Brachial 158                                     +---------+------------------+-----+--------+--------+ ATA      153               0.97 biphasic         +---------+------------------+-----+--------+--------+ PTA      154               0.97 biphasic         +---------+------------------+-----+--------+--------+ Great Toe105               0.66                  +---------+------------------+-----+--------+--------+ +---------+------------------+-----+----------+-------+ Left     Lt Pressure (mmHg)IndexWaveform  Comment +---------+------------------+-----+----------+-------+ Brachial 158                                      +---------+------------------+-----+----------+-------+  ATA      97                0.61 monophasic        +---------+------------------+-----+----------+-------+ PTA      132               0.84 monophasic        +---------+------------------+-----+----------+-------+ Great Toe78                0.49                   +---------+------------------+-----+----------+-------+ +-------+-----------+-----------+------------+------------+ ABI/TBIToday's ABIToday's TBIPrevious ABIPrevious TBI +-------+-----------+-----------+------------+------------+ Right  0.97       0.66       0.77                     +-------+-----------+-----------+------------+------------+ Left   0.84       0.49       Not done                 +-------+-----------+-----------+------------+------------+  Previous ABI on 02/25/23 at [facility] Radiology.  Summary: Right: Resting right ankle-brachial index is within normal range. The right toe-brachial index is abnormal. Left: The left toe-brachial index is abnormal. *See table(s) above for measurements and observations.  Electronically signed by Jimmye Moulds  MD on 09/08/2023 at 11:00:03 AM.    Final     Procedures Procedures    Medications Ordered in ED Medications - No data to display  ED Course/ Medical Decision Making/ A&P                                 Medical Decision Making Amount and/or Complexity of Data Reviewed Labs: ordered.   This patient presents to the ED for concern of lethargy, this involves an extensive number of treatment options, and is a complaint that carries with it a high risk of complications and morbidity.  The differential diagnosis includes medication reaction, hypercarbia   Co morbidities that complicate the patient evaluation  HLD, COPD (2L oxygen  prn), sleep apnea, anxiety/depression, arthritis, HTN, CHF, kidney stones, hypothyroidism, DM2, lung cancer, PAD, and GERD.   Additional history obtained:  Additional history obtained from epic chart review External records from outside source obtained and reviewed including family   Lab Tests:  I Ordered, and personally interpreted labs.  The pertinent results include:  vbg nl   Medicines ordered and prescription drug management:   I have reviewed the patients home medicines and have made adjustments as needed   Problem List / ED Course:  Lack of sleep + benadryl  and ativan  has caused pt to be very sleepy today.  He is now awake and alert and wants to go home.  Family wants to take him home. Hx COPD:  pt did get sob with ambulation, but feels better with sitting.  His O2 sat did drop to 88, but it was on RA.  He uses 2L oxygen .  He feels his breathing is his normal.   Reevaluation:  After the interventions noted above, I reevaluated the patient and found that they have :improved   Social Determinants of Health:  Lives at home   Dispostion:  After consideration of the diagnostic results and the patients response to treatment, I feel that the patent would benefit from discharge with outpatient f/u.          Final Clinical  Impression(s) / ED Diagnoses Final  diagnoses:  Adverse effect of drug, initial encounter  Chronic obstructive pulmonary disease, unspecified COPD type [facility])    Rx / DC Orders ED Discharge Orders     None         Sueellen Emery, MD 09/09/23 1839

## 2023-09-09 NOTE — Patient Instructions (Signed)
 [facility] [place] - A DEPT OF [place]  Discharge Instructions: Thank you for choosing [facility] to provide your oncology and hematology care.  If you have a lab appointment with the Cancer Center - please note that after April 8th, 2024, all labs will be drawn in the cancer center.  You do not have to check in or register with the main entrance as you have in the past but will complete your check-in in the cancer center.  Wear comfortable clothing and clothing appropriate for easy access to any Portacath or PICC line.   We strive to give you quality time with your provider. You may need to reschedule your appointment if you arrive late (15 or more minutes).  Arriving late affects you and other patients whose appointments are after yours.  Also, if you miss three or more appointments without notifying the office, you may be dismissed from the clinic at the provider's discretion.      For prescription refill requests, have your pharmacy contact our office and allow 72 hours for refills to be completed.    Today you received the following chemotherapy and/or immunotherapy agents opdivo, yervoy, taxol , carboplatin      To help prevent nausea and vomiting after your treatment, we encourage you to take your nausea medication as directed.  BELOW ARE SYMPTOMS THAT SHOULD BE REPORTED IMMEDIATELY: *FEVER GREATER THAN 100.4 F (38 C) OR HIGHER *CHILLS OR SWEATING *NAUSEA AND VOMITING THAT IS NOT CONTROLLED WITH YOUR NAUSEA MEDICATION *UNUSUAL SHORTNESS OF BREATH *UNUSUAL BRUISING OR BLEEDING *URINARY PROBLEMS (pain or burning when urinating, or frequent urination) *BOWEL PROBLEMS (unusual diarrhea, constipation, pain near the anus) TENDERNESS IN MOUTH AND THROAT WITH OR WITHOUT PRESENCE OF ULCERS (sore throat, sores in mouth, or a toothache) UNUSUAL RASH, SWELLING OR PAIN  UNUSUAL VAGINAL DISCHARGE OR ITCHING   Items with * indicate a potential emergency  and should be followed up as soon as possible or go to the Emergency Department if any problems should occur.  Please show the CHEMOTHERAPY ALERT CARD or IMMUNOTHERAPY ALERT CARD at check-in to the Emergency Department and triage nurse.  Should you have questions after your visit or need to cancel or reschedule your appointment, please contact [facility]  - A DEPT OF [facility] [phone]  and follow the prompts.  Office hours are 8:00 a.m. to 4:30 p.m. Monday - Friday. Please note that voicemails left after 4:00 p.m. may not be returned until the following business day.  We are closed weekends and major holidays. You have access to a nurse at all times for urgent questions. Please call the main number to the clinic [phone] and follow the prompts.  For any non-urgent questions, you may also contact your provider using MyChart. We now offer e-Visits for anyone 74 and older to request care online for non-urgent symptoms. For details visit [email or web address].   Also download the MyChart app! Go to the app store, search "MyChart", open the app, select [place], and log in with your MyChart username and password.

## 2023-09-10 ENCOUNTER — Ambulatory Visit (INDEPENDENT_AMBULATORY_CARE_PROVIDER_SITE_OTHER)

## 2023-09-10 ENCOUNTER — Encounter: Payer: Self-pay | Admitting: Hematology

## 2023-09-10 DIAGNOSIS — R000 Tachycardia, unspecified: Secondary | ICD-10-CM

## 2023-09-10 LAB — T4: T4, Total: 5.7 ug/dL (ref 4.5–12.0)

## 2023-09-10 NOTE — Progress Notes (Unsigned)
The following biosimilar Udenyca ([id number]) has been selected for use in this patient.   Lakishia Bourassa, PharmD 

## 2023-09-11 ENCOUNTER — Inpatient Hospital Stay

## 2023-09-11 ENCOUNTER — Telehealth: Payer: Self-pay | Admitting: *Deleted

## 2023-09-11 VITALS — BP 98/83 | HR 110 | Temp 97.5°F | Resp 19

## 2023-09-11 DIAGNOSIS — C3412 Malignant neoplasm of upper lobe, left bronchus or lung: Secondary | ICD-10-CM | POA: Diagnosis not present

## 2023-09-11 DIAGNOSIS — Z452 Encounter for adjustment and management of vascular access device: Secondary | ICD-10-CM | POA: Diagnosis not present

## 2023-09-11 DIAGNOSIS — E1165 Type 2 diabetes mellitus with hyperglycemia: Secondary | ICD-10-CM | POA: Diagnosis not present

## 2023-09-11 DIAGNOSIS — R059 Cough, unspecified: Secondary | ICD-10-CM | POA: Diagnosis not present

## 2023-09-11 DIAGNOSIS — R000 Tachycardia, unspecified: Secondary | ICD-10-CM | POA: Diagnosis not present

## 2023-09-11 DIAGNOSIS — Z5112 Encounter for antineoplastic immunotherapy: Secondary | ICD-10-CM | POA: Diagnosis not present

## 2023-09-11 DIAGNOSIS — Z794 Long term (current) use of insulin: Secondary | ICD-10-CM | POA: Diagnosis not present

## 2023-09-11 DIAGNOSIS — Z79620 Long term (current) use of immunosuppressive biologic: Secondary | ICD-10-CM | POA: Diagnosis not present

## 2023-09-11 DIAGNOSIS — I5032 Chronic diastolic (congestive) heart failure: Secondary | ICD-10-CM | POA: Diagnosis not present

## 2023-09-11 DIAGNOSIS — C3411 Malignant neoplasm of upper lobe, right bronchus or lung: Secondary | ICD-10-CM | POA: Diagnosis not present

## 2023-09-11 DIAGNOSIS — E1142 Type 2 diabetes mellitus with diabetic polyneuropathy: Secondary | ICD-10-CM | POA: Diagnosis not present

## 2023-09-11 DIAGNOSIS — I110 Hypertensive heart disease with heart failure: Secondary | ICD-10-CM | POA: Diagnosis not present

## 2023-09-11 DIAGNOSIS — Z5111 Encounter for antineoplastic chemotherapy: Secondary | ICD-10-CM | POA: Diagnosis not present

## 2023-09-11 DIAGNOSIS — Z79899 Other long term (current) drug therapy: Secondary | ICD-10-CM | POA: Diagnosis not present

## 2023-09-11 DIAGNOSIS — Z5189 Encounter for other specified aftercare: Secondary | ICD-10-CM | POA: Diagnosis not present

## 2023-09-11 DIAGNOSIS — C3492 Malignant neoplasm of unspecified part of left bronchus or lung: Secondary | ICD-10-CM

## 2023-09-11 DIAGNOSIS — C3431 Malignant neoplasm of lower lobe, right bronchus or lung: Secondary | ICD-10-CM | POA: Diagnosis not present

## 2023-09-11 DIAGNOSIS — D539 Nutritional anemia, unspecified: Secondary | ICD-10-CM | POA: Diagnosis not present

## 2023-09-11 MED ORDER — PEGFILGRASTIM-CBQV 6 MG/0.6ML ~~LOC~~ SOSY
6.0000 mg | PREFILLED_SYRINGE | Freq: Once | SUBCUTANEOUS | Status: AC
  Administered 2023-09-11: 6 mg via SUBCUTANEOUS
  Filled 2023-09-11: qty 0.6

## 2023-09-11 NOTE — Telephone Encounter (Signed)
 Patient left a message on 09/10/23 stating that he had taken his last prednisone . His blood sugar had started to come down some. He wanted to know how to adjust his insulin  as he is currently injecting 28 units at bedtime.  Patient was called and a message was left at 9:41 am , asking that the patient call our office back. We need readings of his blood sugars to adjust insulin  correctly.

## 2023-09-11 NOTE — Patient Instructions (Signed)
 [facility] - A DEPT OF [facility]  Discharge Instructions: Thank you for choosing [facility] to provide your oncology and hematology care.  If you have a lab appointment with the Cancer Center - please note that after April 8th, 2024, all labs will be drawn in the cancer center.  You do not have to check in or register with the main entrance as you have in the past but will complete your check-in in the cancer center.  Wear comfortable clothing and clothing appropriate for easy access to any Portacath or PICC line.   We strive to give you quality time with your provider. You may need to reschedule your appointment if you arrive late (15 or more minutes).  Arriving late affects you and other patients whose appointments are after yours.  Also, if you miss three or more appointments without notifying the office, you may be dismissed from the clinic at the provider's discretion.      For prescription refill requests, have your pharmacy contact our office and allow 72 hours for refills to be completed.    T   To help prevent nausea and vomiting after your treatment, we encourage you to take your nausea medication as directed.  BELOW ARE SYMPTOMS THAT SHOULD BE REPORTED IMMEDIATELY: *FEVER GREATER THAN 100.4 F (38 C) OR HIGHER *CHILLS OR SWEATING *NAUSEA AND VOMITING THAT IS NOT CONTROLLED WITH YOUR NAUSEA MEDICATION *UNUSUAL SHORTNESS OF BREATH *UNUSUAL BRUISING OR BLEEDING *URINARY PROBLEMS (pain or burning when urinating, or frequent urination) *BOWEL PROBLEMS (unusual diarrhea, constipation, pain near the anus) TENDERNESS IN MOUTH AND THROAT WITH OR WITHOUT PRESENCE OF ULCERS (sore throat, sores in mouth, or a toothache) UNUSUAL RASH, SWELLING OR PAIN  UNUSUAL VAGINAL DISCHARGE OR ITCHING   Items with * indicate a potential emergency and should be followed up as soon as possible or go to the Emergency Department if any problems should  occur.  Please show the CHEMOTHERAPY ALERT CARD or IMMUNOTHERAPY ALERT CARD at check-in to the Emergency Department and triage nurse.  Should you have questions after your visit or need to cancel or reschedule your appointment, please contact [facility] [place] - A DEPT OF [facility] [phone]  and follow the prompts.  Office hours are 8:00 a.m. to 4:30 p.m. Monday - Friday. Please note that voicemails left after 4:00 p.m. may not be returned until the following business day.  We are closed weekends and major holidays. You have access to a nurse at all times for urgent questions. Please call the main number to the clinic [phone] and follow the prompts.  For any non-urgent questions, you may also contact your provider using MyChart. We now offer e-Visits for anyone 76 and older to request care online for non-urgent symptoms. For details visit [email or web address].   Also download the MyChart app! Go to the app store, search "MyChart", open the app, select [place], and log in with your MyChart username and password.

## 2023-09-11 NOTE — Progress Notes (Signed)
 Patient tolerated injection with no complaints voiced.  Site clean and dry with no bruising or swelling noted at site.  See MAR for details.  Band aid applied.  Patient stable during and after injection.  Vss with discharge and left in satisfactory condition with no s/s of distress noted.

## 2023-09-11 NOTE — Progress Notes (Signed)
 24 hour post chemotherapy call back: per pt he is feeling well, eating well, no nausea, and no fatigue. RN educated pt on the importance to notifying the clinic if any complications occur and when to seek emergency care, pt verbalized understanding all questions answered at this time. Cancer center number given to patient.   John [name]

## 2023-09-14 ENCOUNTER — Encounter: Payer: Self-pay | Admitting: Hematology

## 2023-09-14 ENCOUNTER — Ambulatory Visit (INDEPENDENT_AMBULATORY_CARE_PROVIDER_SITE_OTHER)

## 2023-09-14 DIAGNOSIS — C3411 Malignant neoplasm of upper lobe, right bronchus or lung: Secondary | ICD-10-CM | POA: Diagnosis not present

## 2023-09-14 DIAGNOSIS — D630 Anemia in neoplastic disease: Secondary | ICD-10-CM

## 2023-09-14 DIAGNOSIS — I700 Atherosclerosis of aorta: Secondary | ICD-10-CM | POA: Diagnosis not present

## 2023-09-14 DIAGNOSIS — E1142 Type 2 diabetes mellitus with diabetic polyneuropathy: Secondary | ICD-10-CM

## 2023-09-14 DIAGNOSIS — J9611 Chronic respiratory failure with hypoxia: Secondary | ICD-10-CM | POA: Diagnosis not present

## 2023-09-14 DIAGNOSIS — E1159 Type 2 diabetes mellitus with other circulatory complications: Secondary | ICD-10-CM | POA: Diagnosis not present

## 2023-09-14 DIAGNOSIS — I4892 Unspecified atrial flutter: Secondary | ICD-10-CM | POA: Diagnosis not present

## 2023-09-14 DIAGNOSIS — I4891 Unspecified atrial fibrillation: Secondary | ICD-10-CM

## 2023-09-14 DIAGNOSIS — I5033 Acute on chronic diastolic (congestive) heart failure: Secondary | ICD-10-CM | POA: Diagnosis not present

## 2023-09-14 DIAGNOSIS — E1169 Type 2 diabetes mellitus with other specified complication: Secondary | ICD-10-CM | POA: Diagnosis not present

## 2023-09-14 DIAGNOSIS — E782 Mixed hyperlipidemia: Secondary | ICD-10-CM

## 2023-09-14 DIAGNOSIS — I152 Hypertension secondary to endocrine disorders: Secondary | ICD-10-CM | POA: Diagnosis not present

## 2023-09-15 ENCOUNTER — Telehealth: Payer: Self-pay | Admitting: *Deleted

## 2023-09-15 NOTE — Telephone Encounter (Signed)
 Patient had left a message that his sugars were back down , a lot better. He was wanting to know what to do about his insulin . It has recently been increased by 10 additional units to equal 28 units . Hulon Magic, NP had instructed that when this happened , the patient should go back to the 18 units of insulin  that he was taking. He is to keep a record of his blood sugars, and he is to call if he has any changes in his blood sugars or if he has questions.  Patient verbalized understanding.

## 2023-09-16 ENCOUNTER — Ambulatory Visit: Admitting: Hematology

## 2023-09-16 ENCOUNTER — Other Ambulatory Visit

## 2023-09-16 ENCOUNTER — Ambulatory Visit

## 2023-09-17 ENCOUNTER — Inpatient Hospital Stay

## 2023-09-17 ENCOUNTER — Inpatient Hospital Stay: Admitting: Hematology

## 2023-09-17 ENCOUNTER — Ambulatory Visit (HOSPITAL_COMMUNITY)
Admission: RE | Admit: 2023-09-17 | Discharge: 2023-09-17 | Disposition: A | Discharge status: Home / Self Care | Source: Ambulatory Visit | Attending: Hematology | Admitting: Hematology

## 2023-09-17 VITALS — BP 118/48 | HR 126

## 2023-09-17 VITALS — BP 117/51 | HR 108 | Temp 98.7°F | Resp 18

## 2023-09-17 VITALS — BP 174/123 | HR 128 | Temp 99.7°F | Resp 20

## 2023-09-17 DIAGNOSIS — I110 Hypertensive heart disease with heart failure: Secondary | ICD-10-CM | POA: Diagnosis not present

## 2023-09-17 DIAGNOSIS — C3412 Malignant neoplasm of upper lobe, left bronchus or lung: Secondary | ICD-10-CM | POA: Diagnosis not present

## 2023-09-17 DIAGNOSIS — R059 Cough, unspecified: Secondary | ICD-10-CM | POA: Insufficient documentation

## 2023-09-17 DIAGNOSIS — R000 Tachycardia, unspecified: Secondary | ICD-10-CM | POA: Diagnosis not present

## 2023-09-17 DIAGNOSIS — Z794 Long term (current) use of insulin: Secondary | ICD-10-CM | POA: Diagnosis not present

## 2023-09-17 DIAGNOSIS — D500 Iron deficiency anemia secondary to blood loss (chronic): Secondary | ICD-10-CM

## 2023-09-17 DIAGNOSIS — Z79620 Long term (current) use of immunosuppressive biologic: Secondary | ICD-10-CM | POA: Diagnosis not present

## 2023-09-17 DIAGNOSIS — R058 Other specified cough: Secondary | ICD-10-CM | POA: Diagnosis not present

## 2023-09-17 DIAGNOSIS — D539 Nutritional anemia, unspecified: Secondary | ICD-10-CM | POA: Diagnosis not present

## 2023-09-17 DIAGNOSIS — Z95828 Presence of other vascular implants and grafts: Secondary | ICD-10-CM

## 2023-09-17 DIAGNOSIS — C3492 Malignant neoplasm of unspecified part of left bronchus or lung: Secondary | ICD-10-CM

## 2023-09-17 DIAGNOSIS — I5032 Chronic diastolic (congestive) heart failure: Secondary | ICD-10-CM | POA: Diagnosis not present

## 2023-09-17 DIAGNOSIS — C3491 Malignant neoplasm of unspecified part of right bronchus or lung: Secondary | ICD-10-CM

## 2023-09-17 DIAGNOSIS — Z452 Encounter for adjustment and management of vascular access device: Secondary | ICD-10-CM | POA: Diagnosis not present

## 2023-09-17 DIAGNOSIS — Z79899 Other long term (current) drug therapy: Secondary | ICD-10-CM | POA: Diagnosis not present

## 2023-09-17 DIAGNOSIS — E1165 Type 2 diabetes mellitus with hyperglycemia: Secondary | ICD-10-CM | POA: Diagnosis not present

## 2023-09-17 DIAGNOSIS — J449 Chronic obstructive pulmonary disease, unspecified: Secondary | ICD-10-CM | POA: Diagnosis not present

## 2023-09-17 DIAGNOSIS — C3411 Malignant neoplasm of upper lobe, right bronchus or lung: Secondary | ICD-10-CM | POA: Diagnosis not present

## 2023-09-17 DIAGNOSIS — Z5111 Encounter for antineoplastic chemotherapy: Secondary | ICD-10-CM | POA: Diagnosis not present

## 2023-09-17 DIAGNOSIS — C3490 Malignant neoplasm of unspecified part of unspecified bronchus or lung: Secondary | ICD-10-CM | POA: Diagnosis not present

## 2023-09-17 DIAGNOSIS — C3431 Malignant neoplasm of lower lobe, right bronchus or lung: Secondary | ICD-10-CM | POA: Diagnosis not present

## 2023-09-17 DIAGNOSIS — Z5189 Encounter for other specified aftercare: Secondary | ICD-10-CM | POA: Diagnosis not present

## 2023-09-17 DIAGNOSIS — E1142 Type 2 diabetes mellitus with diabetic polyneuropathy: Secondary | ICD-10-CM | POA: Diagnosis not present

## 2023-09-17 DIAGNOSIS — Z5112 Encounter for antineoplastic immunotherapy: Secondary | ICD-10-CM | POA: Diagnosis not present

## 2023-09-17 LAB — CBC WITH DIFFERENTIAL/PLATELET
Abs Immature Granulocytes: 0.2 10*3/uL — ABNORMAL HIGH (ref 0.00–0.07)
Band Neutrophils: 1 %
Basophils Absolute: 0 10*3/uL (ref 0.0–0.1)
Basophils Relative: 0 %
Eosinophils Absolute: 0.1 10*3/uL (ref 0.0–0.5)
Eosinophils Relative: 1 %
HCT: 30.8 % — ABNORMAL LOW (ref 39.0–52.0)
Hemoglobin: 10.1 g/dL — ABNORMAL LOW (ref 13.0–17.0)
Lymphocytes Relative: 8 %
Lymphs Abs: 0.7 10*3/uL (ref 0.7–4.0)
MCH: 33.3 pg (ref 26.0–34.0)
MCHC: 32.8 g/dL (ref 30.0–36.0)
MCV: 101.7 fL — ABNORMAL HIGH (ref 80.0–100.0)
Metamyelocytes Relative: 1 %
Monocytes Absolute: 0.5 10*3/uL (ref 0.1–1.0)
Monocytes Relative: 6 %
Myelocytes: 2 %
Neutro Abs: 6.8 10*3/uL (ref 1.7–7.7)
Neutrophils Relative %: 81 %
Platelets: 131 10*3/uL — ABNORMAL LOW (ref 150–400)
RBC: 3.03 MIL/uL — ABNORMAL LOW (ref 4.22–5.81)
RDW: 15.9 % — ABNORMAL HIGH (ref 11.5–15.5)
WBC: 8.3 10*3/uL (ref 4.0–10.5)
nRBC: 0.4 % — ABNORMAL HIGH (ref 0.0–0.2)

## 2023-09-17 LAB — COMPREHENSIVE METABOLIC PANEL WITH GFR
ALT: 15 U/L (ref 0–44)
AST: 17 U/L (ref 15–41)
Albumin: 2.9 g/dL — ABNORMAL LOW (ref 3.5–5.0)
Alkaline Phosphatase: 117 U/L (ref 38–126)
Anion gap: 14 (ref 5–15)
BUN: 20 mg/dL (ref 8–23)
CO2: 23 mmol/L (ref 22–32)
Calcium: 8.6 mg/dL — ABNORMAL LOW (ref 8.9–10.3)
Chloride: 99 mmol/L (ref 98–111)
Creatinine, Ser: 1.01 mg/dL (ref 0.61–1.24)
GFR, Estimated: >60 mL/min (ref >60)
Glucose, Bld: 147 mg/dL — ABNORMAL HIGH (ref 70–99)
Potassium: 4.5 mmol/L (ref 3.5–5.1)
Sodium: 136 mmol/L (ref 135–145)
Total Bilirubin: 0.6 mg/dL (ref 0.0–1.2)
Total Protein: 6.6 g/dL (ref 6.5–8.1)

## 2023-09-17 LAB — MAGNESIUM: Magnesium: 1.7 mg/dL (ref 1.7–2.4)

## 2023-09-17 MED ORDER — SODIUM CHLORIDE 0.9% FLUSH
10.0000 mL | INTRAVENOUS | Status: AC
  Administered 2023-09-17: 10 mL

## 2023-09-17 MED ORDER — MAGNESIUM SULFATE 2 GM/50ML IV SOLN
2.0000 g | Freq: Once | INTRAVENOUS | Status: AC
  Administered 2023-09-17: 2 g via INTRAVENOUS
  Filled 2023-09-17: qty 50

## 2023-09-17 MED ORDER — ALTEPLASE 2 MG IJ SOLR
2.0000 mg | Freq: Once | INTRAMUSCULAR | Status: AC | PRN
  Administered 2023-09-17: 2 mg
  Filled 2023-09-17: qty 2

## 2023-09-17 MED ORDER — SODIUM CHLORIDE 0.9% FLUSH
10.0000 mL | INTRAVENOUS | Status: DC | PRN
  Administered 2023-09-17: 10 mL via INTRAVENOUS

## 2023-09-17 MED ORDER — HEPARIN SOD (PORK) LOCK FLUSH 100 UNIT/ML IV SOLN
500.0000 [IU] | Freq: Once | INTRAVENOUS | Status: AC
  Administered 2023-09-17: 500 [IU] via INTRAVENOUS

## 2023-09-17 MED ORDER — POTASSIUM CHLORIDE IN NACL 20-0.9 MEQ/L-% IV SOLN
Freq: Once | INTRAVENOUS | Status: AC
  Filled 2023-09-17: qty 1000

## 2023-09-17 NOTE — Progress Notes (Signed)
 Patient presents today for possible fluids per provider's orders. Vitals stable and patient c/o knee pain.Dr.Katragadda made aware and no new orders were given. Message received from Dr.Katragadda to give house fluids over an hour.   Treatment given today per MD orders. Tolerated infusion without adverse affects. Vital signs stable. No complaints at this time. Discharged from clinic via wheelchair in stable condition. Alert and oriented x 3. F/U with [facility] as scheduled.

## 2023-09-17 NOTE — Progress Notes (Signed)
 Bsx Loop Recorder

## 2023-09-17 NOTE — Patient Instructions (Signed)
 [facility] at [facility] Discharge Instructions   You were seen and examined today by Dr. Cheree Cords.  He reviewed the results of your lab work which are normal/stable.   We will give you some fluids today. We will also order you to have a chest x-ray today to rule out infection.   We will see you back in  week. We will repeat lab work at that time and schedule you to receive fluids should you need them.     Return as scheduled.    Thank you for choosing  Cancer Center at [facility] to provide your oncology and hematology care.  To afford each patient quality time with our provider, please arrive at least 15 minutes before your scheduled appointment time.   If you have a lab appointment with the Cancer Center please come in thru the Main Entrance and check in at the main information desk.  You need to re-schedule your appointment should you arrive 10 or more minutes late.  We strive to give you quality time with our providers, and arriving late affects you and other patients whose appointments are after yours.  Also, if you no show three or more times for appointments you may be dismissed from the clinic at the providers discretion.     Again, thank you for choosing [facility].  Our hope is that these requests will decrease the amount of time that you wait before being seen by our physicians.       _____________________________________________________________  Should you have questions after your visit to [facility], please contact our office at [phone] and follow the prompts.  Our office hours are 8:00 a.m. and 4:30 p.m. Monday - Friday.  Please note that voicemails left after 4:00 p.m. may not be returned until the following business day.  We are closed weekends and major holidays.  You do have access to a nurse 24-7, just call the main number to the clinic [phone] and do not press any options, hold on the  line and a nurse will answer the phone.    For prescription refill requests, have your pharmacy contact our office and allow 72 hours.    Due to Covid, you will need to wear a mask upon entering the hospital. If you do not have a mask, a mask will be given to you at the Main Entrance upon arrival. For doctor visits, patients may have 1 support person age 3 or older with them. For treatment visits, patients can not have anyone with them due to social distancing guidelines and our immunocompromised population.

## 2023-09-17 NOTE — Progress Notes (Signed)
 [facility] [address], [place] [address]    Clinic Day:  09/17/2023  Referring physician: Yevette Hem, FNP  Patient Care Team: Yevette Hem, FNP as PCP - General (Family Medicine) O'Neal, Cathay Clonts, MD as PCP - Cardiology (Cardiology) Boyce Byes, MD as PCP - Electrophysiology (Cardiology) Paulett Boros, MD as Consulting Physician (Hematology) Delilah Fend, [facility] (Pharmacist) O'Neal, Cathay Clonts, MD as Consulting Physician (Cardiology) McKenzie, Arden Beck, MD as Consulting Physician (Urology) Alexia Idler, OD (Optometry) Wendel Hals, NP as Nurse Practitioner (Endocrinology) Ashok Blake, DPM as Consulting Physician (Podiatry) Young Hensen, MD as Consulting Physician (Vascular Surgery)   ASSESSMENT & PLAN:   Assessment: 1.  Stage IIIa (T1b N2 cM0) NSCLC, favoring adenocarcinoma: -Chemo XRT with weekly carboplatin  and paclitaxel  completed on 08/20/2016. -Consolidation durvalumab  from 09/26/2016 through 09/25/2017. -Reviewed CT chest on 11/07/2019 which showed increasing size of the upper lobe pulmonary nodules with no new nodule in the left upper lobe suspicious for meta stasis.  Also increasing confluent soft tissue in the right suprahilar region, thought to be postradiation changes. -PET scan on 11/21/2019 showed suspected radiation changes in the right paramediastinal/perihilar region although mild hypermetabolism is present in the right retroareolar region.SUV 2.5.  While progressive soft tissue is present in this region, this hypermetabolism remains nonfocal and favors radiation changes.  7 mm central left upper lobe nodule max SUV 1.4.  9 mm aggregate nodule in the lateral right upper lobe max SUV 2.4.  These are minimally progressive from more remote prior scans.  6 mm left lower lobe nodule unchanged.  No hypermetabolic thoracic adenopathy. -CT chest on 03/23/2020 showed many of the lung nodules are stable.  Couple  of nodules have grown by few millimeters.  No new areas seen. - SBRT to the right and left lung lesions, 54 Gray in 3 fractions from 09/10/2020 through 09/17/2020. - SBRT to the RUL and LUL lesions from 06/19/2022 through 06/26/2022 by Dr. Eloise Hake. - PET scan (07/09/2023): LUL mass 3.5 x 5.1 cm with SUV 10.8.  Additional hypermetabolic nodules in the right lung both within the post radiation consolidation in the right upper lobe SUV 10.2 and within the medial right lower lobe SUV 8.4 measuring 2 x 2.4 cm.  No adenopathy. - Bronchoscopy and biopsy on 08/03/2023. - Pathology: RLL FNA squamous cell carcinoma (p40 positive and negative for TTF-1).  LUL FNA: Favor squamous cell carcinoma (positive for p40 and show patchy staining for TTF-1).  RUL endobronchial brushing shows atypical cells. - [id number] (07/25/2023): T p53 mutation.  MSI-high not detected.  TMB not evaluable. - [facility] NGS: MS-stable, TMB-high (13 Mut/Mb), PD-L1 (22 C3) TPS 10%.  Met IHC protein 3+, 90% (Telisotuzumab Vedotin for non-squamous histology), HER2 IHC (1+). - Checkmate 9 LA (carboplatin , paclitaxel , nivolumab  every 3 weeks and ipilimumab  every 6 weeks) started on 09/09/2023 - He has PD-L1 TPS of 10% with no other targetable mutations.  He has MET IHC protein 3+90%, but the treatment is not offered for squamous histology.  GPS AI showed high probability of adenocarcinoma.   2.  Normocytic to macrocytic anemia: - Found to have hemoglobin 6.3 on 10/09/2020 and received 2 units PRBC while hospitalized.  I have reviewed hospitalization records. - EGD on 09/06/2020 with tiny gastric ulcer without bleeding or stigmata. - Blood loss this year most likely postoperative in nature (urological surgery in February and vascular surgery in May).    Plan: 1.  Stage IV squamous cell lung  cancer: - He received cycle 1 of carboplatin , paclitaxel , nivolumab  and ipilimumab  on 09/09/2023.  He became sleepy with 50 mg dose of Benadryl  and developed restless legs.   We gave him Ativan  0.25 mg which made him severely sleepy after treatment. -He reported body aches for 3 days after G-CSF injection. - He generally feels weak today.  His wife reported that he was shaking yesterday and did not eat much.  She called 911, ambulance came and checked him at home.  Apparently vitals were normal.  He was told to go to the ER but he refused.  Today he reports that he is feeling fine.  He has some cough with clear and occasionally dark expectoration. - Blood pressure repeat measurement was 118/48.  Heart rate was 126.  Will give him 500 mL of normal saline. - Will obtain chest x-ray PA and lateral views.  I have reviewed the films which appear clear with prominent bronchovascular markings.  Will wait for radiology report.  He will come back in 1 week for follow-up with labs and possible fluids.   2.  Normocytic to macrocytic anemia: - Last Monoferric  on 04/10/2023.  CBC today shows hemoglobin of 10.1, partly from myelosuppression.   3.  Decreased appetite/weight loss: - Continue Megace  40 mg twice daily which is helping.  4.  Peripheral neuropathy: - Has neuropathy in the feet and hands, burning type pain secondary to diabetes.  Continue gabapentin  3 times daily.  5.  Chronic diastolic CHF/sinus tachycardia: - He is on Cardizem  CD 120 mg daily, torsemide  20 mg half tablet daily, spironolactone  25 mg twice daily, metolazone  2.5 mg 3 times weekly.    Orders Placed This Encounter  Procedures   DG Chest 2 View    Standing Status:   Future    Number of Occurrences:   1    Expiration Date:   09/16/2024    Reason for Exam (SYMPTOM  OR DIAGNOSIS REQUIRED):   productive cough    Preferred imaging location?:   [facility]      I,Helena R Teague,acting as a scribe for Paulett Boros, MD.,have documented all relevant documentation on the behalf of Paulett Boros, MD,as directed by  Paulett Boros, MD while in the presence of Paulett Boros,  MD.  I, Paulett Boros MD, have reviewed the above documentation for accuracy and completeness, and I agree with the above.     Paulett Boros, MD   6/12/20252:34 PM  CHIEF COMPLAINT:   Diagnosis: right NSCLC, favor adenocarcinoma    Cancer Staging  Non-small cell lung cancer, right [facility]) Staging form: Lung, AJCC 8th Edition - Clinical stage from 06/05/2016: Stage IIIA (cT1b(2), cN2, cM0) - Signed by Doretta Gant, PA-C on 08/21/2016  Primary squamous cell carcinoma of left lung (HCC) Staging form: Lung, AJCC V9 - Clinical stage from 08/17/2023: Stage IVA (rcT2b, cN0, cM1a) - Unsigned    Prior Therapy: 1. Chemoradiation with weekly carboplatin  and paclitaxel , 06/25/16 - 08/20/16. 2. Consolidation durvalumab , 09/26/16 - 09/25/17. 3. SBRT to RUL, LUL nodules 09/10/20 - 09/17/20 4. SBRT to RUL, LUL nodules 06/19/22 - 06/26/22  Current Therapy: Under workup   HISTORY OF PRESENT ILLNESS:   Oncology History  Non-small cell lung cancer, right (HCC)  04/17/2016 Imaging   CT chest- 1. Examination is positive for bilateral upper lobe pulmonary lesions suspicious for malignancy. Further investigation with PET-CT and tissue sampling is advised. 2. Borderline enlarged paratracheal lymph nodes and right hilar nodes. Cannot rule out metastatic adenopathy. Attention  to these areas on PET-CT is advised. 3. Emphysema 4. Aortic atherosclerosis and multi vessel coronary artery calcification.   04/18/2016 Initial Diagnosis   Adenocarcinoma of lung, right (HCC)   04/24/2016 PET scan   1. Adjacent hypermetabolic nodules in the RIGHT upper lobe consistent bronchogenic carcinoma. 2. Suspicion of ipsilateral hilar and paratracheal nodal metastasis. 3. Nodule in the LEFT upper lobe with suspicious morphology has a relatively low metabolic activity for size. Favor synchronous primary bronchogenic carcinoma. 4. No metabolic activity associated with a small LEFT lower lobe pulmonary nodule.  Recommend attention on follow-up.   05/06/2016 Imaging   MRI brain- Negative for metastatic disease.   05/28/2016 Procedure   1. Electromagnetic navigational bronchoscopy with brushings, needle     aspirations and biopsies of bilateral upper lobe nodules. 2. Endobronchial ultrasound with mediastinal and hilar lymph node     aspirations. By Dr. [name]   06/03/2016 Pathology Results   Diagnosis 1. Lung, biopsy, Right upper lobe - BLOOD AND BENIGN BRONCHIAL AND LUNG TISSUE. 2. Lung, biopsy, Left upper lobe - BLOOD AND BENIGN BRONCHIAL AND LUNG TISSUE.   06/03/2016 Pathology Results   Diagnosis TRANSBRONCHIAL NEEDLE ASPIRATION, NAVIGATION, RUL (SPECIMEN 5 OF 7, COLLECTED 05/28/16): MALIGNANT CELLS CONSISTENT WITH NON-SMALL CELL CARCINOMA Comment There are limited tumor cells in the cell block (insufficient for molecular testing). TTF-1 is positive in a few of the atypical cells. They appear negative for synaptophysin and cytokeratin 5/6. Overall there is limited tumor, but a lung adenocarcinoma is slightly favored. Dr. Portia Brittle has reviewed the case. The case was called to Dr. [name] on 06/03/2016.   06/13/2016 Procedure   Port placed by Dr. Larrie Po   06/25/2016 - 08/15/2016 Chemotherapy   The patient had palonosetron  (ALOXI ) injection 0.25 mg, 0.25 mg, Intravenous,  Once, 6 of 6 cycles  CARBOplatin  (PARAPLATIN ) 220 mg in sodium chloride  0.9 % 250 mL chemo infusion, 220 mg (100 % of original dose 215.8 mg), Intravenous,  Once, 6 of 6 cycles Dose modification:   (original dose 215.8 mg, Cycle 1),   (original dose 215.8 mg, Cycle 5),   (original dose 215.8 mg, Cycle 6),   (original dose 215.8 mg, Cycle 2),   (original dose 215.8 mg, Cycle 3),   (original dose 215.8 mg, Cycle 4)  PACLitaxel  (TAXOL ) 90 mg in dextrose  5 % 250 mL chemo infusion ( for chemotherapy treatment.     06/25/2016 - 08/20/2016 Radiation Therapy   Radiation in [place], [place] by Dr. Denver Flaming.  R lung to 6600 cGy    07/09/2016 - 07/10/2016 Hospital Admission   Admit date: 07/09/2016  Admission diagnosis: Fever and chills Additional comments: Negative work-up, discharged with course of Levaquin    07/09/2016 Treatment Plan Change   Treatment deferred x 1 week due to hospitalization    08/05/2016 - 08/06/2016 Hospital Admission   Admit date: 08/05/2016 Admission diagnosis: Joint pain Additional comments: Treated with steroids with symptomatic improvement   08/25/2016 Imaging   MRI brain- Negative for intracranial metastasis on this motion degraded examination.  Stable mild-to-moderate chronic small vessel ischemic disease.   09/18/2016 PET scan   1. Partial metabolic response. Right upper lobe 2.6 cm hypermetabolic pulmonary nodule, decreased in size and metabolism. Mildly hypermetabolic right hilar nodal metastasis, decreased in metabolism. Right paratracheal lymph node is no longer hypermetabolic. No new or progressive metastatic disease. 2. Apical left upper lobe 0.9 cm solid pulmonary nodule, slightly decreased in size, with no significant metabolism. The change in size could indicate a neoplastic etiology for  this nodule. 3. Additional subcentimeter pulmonary nodules in the left lung are stable and below PET resolution . 4. Additional findings include aortic atherosclerosis, coronary atherosclerosis, moderate emphysema, stable small pericardial effusion/thickening and mild sigmoid diverticulosis.   01/13/2017 Imaging   CT chest with contrast IMPRESSION: 1. No change in size of the hypermetabolic nodular thickening in the RIGHT upper lobe. 2. LEFT upper lobe spiculated nodule is stable. 3. Rounded LEFT lower lobe pulmonary nodule is stable. 4. No evidence of lung cancer progression.   05/08/2017 Adverse Reaction   Development of Hypothyroidism- likely secondary to immunotherapy.  Levothyroxine  therapy initiated.    Primary squamous cell carcinoma of left lung (HCC)  08/17/2023 Initial Diagnosis    Primary squamous cell carcinoma of left lung (HCC)   09/09/2023 -  Chemotherapy   Patient is on Treatment Plan : LUNG NSCLC SQUAMOUS Nivolumab  + Ipilimumab  + Carboplatin  + Paclitaxel  q42d X 1 cycle / Nivolumab  + Ipilimumab  q42d        INTERVAL HISTORY:   John [name] is a 78 y.o. male presenting to clinic today for follow up of right NSCLC, favor adenocarcinoma. He was last seen by me on 09/01/23.  Today, he states that he is doing well overall. His appetite level is at 0%. His energy level is at 0%. John [name] is accompanied by his wife.  His wife notes he has episodes of uncontrollable shaking, refusing to eat, and acting unlike himself that began yesterday. His wife called 911 for him yesterday, but he refused to go to the ED. His temperature was normal when checked by first responders. His oxygen  saturation yesterday was in the 90's. He did not take any of his medication yesterday, though he took all his medications this morning.   His wife notes yesterday he seemed confused and accidentally urinated on the floor and had a BM while still clothed on the toilet.   John [name] also reports fatigue, generalized weakness, nausea, and 3 episodes of diarrhea. His fatigue is severe and he is unable to do his daily activities. He is able to ambulate very short distances with a cane, though he is not stable when doing so due to shaking. He denies any vomiting.    Dhairya notes a worsening cough producing phlegm that is occasionally dark but otherwise clear.He denies any fever, though he does feel cold.   Mercedes had 3 days of body aches after GCSF injection.  PAST MEDICAL HISTORY:   Past 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   Anxiety    Arthritis    Asthma    Atrial flutter (HCC)    BPH (benign prostatic hyperplasia)    with urinary retention 02/06/20   CHF (congestive heart failure) (HCC)    COPD (chronic obstructive pulmonary disease)  (HCC)    Depression    Diabetes mellitus, type II (HCC)    Dyspnea    GERD (gastroesophageal reflux disease)    History of blood transfusion    History of kidney stones    History of radiation therapy    right lung 09/10/2020-09/17/2020   Dr Eloise Hake   History of radiation therapy    Left and right Lung- 06/19/22-06/26/22   Hyperlipidemia    Hypertension    Hypothyroidism    IDA (iron  deficiency anemia)    Macular degeneration    Neuropathy    hands and feet   Non-small cell lung cancer, right (HCC) 04/18/2016   Oxygen  dependent  uses 2L oxygen  as needed   PAD (peripheral artery disease) (HCC) 03/2023   managed by Dr Jimmye Moulds   Peripheral vascular disease [facility])    Prostatitis    Pulmonary nodule, left 07/16/2016   Sleep apnea 2019   uses CPAP nightly    Surgical History: Past Surgical History:  Procedure Laterality Date   A-FLUTTER ABLATION N/A 01/13/2022   Procedure: A-FLUTTER ABLATION;  Surgeon: Boyce Byes, MD;  Location: [facility] INVASIVE CV LAB;  Service: Cardiovascular;  Laterality: N/A;   ABDOMINAL AORTOGRAM W/LOWER EXTREMITY Left 02/06/2020   Procedure: ABDOMINAL AORTOGRAM W/LOWER EXTREMITY;  Surgeon: Avanell Leigh, MD;  Location: [facility] INVASIVE CV LAB;  Service: Cardiovascular;  Laterality: Left;   ABDOMINAL AORTOGRAM W/LOWER EXTREMITY N/A 05/30/2021   Procedure: ABDOMINAL AORTOGRAM W/LOWER EXTREMITY;  Surgeon: Young Hensen, MD;  Location: [facility] INVASIVE CV LAB;  Service: Cardiovascular;  Laterality: N/A;   ABDOMINAL AORTOGRAM W/LOWER EXTREMITY N/A 02/05/2023   Procedure: ABDOMINAL AORTOGRAM W/LOWER EXTREMITY;  Surgeon: Young Hensen, MD;  Location: [facility] INVASIVE CV LAB;  Service: Cardiovascular;  Laterality: N/A;   BIOPSY  09/16/2021   Procedure: BIOPSY;  Surgeon: Kenney Peacemaker, MD;  Location: [facility] ENDOSCOPY;  Service: Gastroenterology;;   BRONCHIAL BIOPSY  08/03/2023   Procedure: BRONCHOSCOPY, WITH BIOPSY;  Surgeon: Denson Flake, MD;   Location: [facility] ENDOSCOPY;  Service: Pulmonary;;   BRONCHIAL BRUSHINGS  08/03/2023   Procedure: BRONCHOSCOPY, WITH BRUSH BIOPSY;  Surgeon: Denson Flake, MD;  Location: [facility] ENDOSCOPY;  Service: Pulmonary;;   BRONCHIAL NEEDLE ASPIRATION BIOPSY  08/03/2023   Procedure: BRONCHOSCOPY, WITH NEEDLE ASPIRATION BIOPSY;  Surgeon: Denson Flake, MD;  Location: [facility] ENDOSCOPY;  Service: Pulmonary;;   CARDIOVERSION N/A 10/05/2020   Procedure: CARDIOVERSION;  Surgeon: Harrold Lincoln, MD;  Location: [facility] ENDOSCOPY;  Service: Cardiovascular;  Laterality: N/A;   CATARACT EXTRACTION, BILATERAL Bilateral    COLONOSCOPY WITH PROPOFOL  N/A 06/20/2021   Procedure: COLONOSCOPY WITH PROPOFOL ;  Surgeon: Tobin Forts, MD;  Location: [facility] ENDOSCOPY;  Service: Endoscopy;  Laterality: N/A;   COLONOSCOPY WITH PROPOFOL  N/A 09/16/2021   Procedure: COLONOSCOPY WITH PROPOFOL ;  Surgeon: Kenney Peacemaker, MD;  Location: [facility] ENDOSCOPY;  Service: Gastroenterology;  Laterality: N/A;   ENDARTERECTOMY FEMORAL Right 08/20/2020   Procedure: ENDARTERECTOMY  RIGHT FEMORAL ARTERY;  Surgeon: Young Hensen, MD;  Location: [facility] OR;  Service: Vascular;  Laterality: Right;   ENTEROSCOPY N/A 10/11/2021   Procedure: ENTEROSCOPY;  Surgeon: Urban Garden, MD;  Location: [facility] ENDO SUITE;  Service: Gastroenterology;  Laterality: N/A;   ENTEROSCOPY N/A 02/18/2023   Procedure: ENTEROSCOPY;  Surgeon: Suzette Espy, MD;  Location: [facility] ENDO SUITE;  Service: Endoscopy;  Laterality: N/A;  EGD with possible small bowel push enteroscopy   ESOPHAGOGASTRODUODENOSCOPY N/A 09/16/2021   Procedure: ESOPHAGOGASTRODUODENOSCOPY (EGD);  Surgeon: Kenney Peacemaker, MD;  Location: [facility] ENDOSCOPY;  Service: Gastroenterology;  Laterality: N/A;   ESOPHAGOGASTRODUODENOSCOPY (EGD) WITH PROPOFOL  N/A 09/06/2020   Procedure: ESOPHAGOGASTRODUODENOSCOPY (EGD) WITH PROPOFOL ;  Surgeon: Tobin Forts, MD;  Location: [facility] ENDOSCOPY;  Service: Endoscopy;  Laterality: N/A;    ESOPHAGOGASTRODUODENOSCOPY (EGD) WITH PROPOFOL  N/A 10/11/2021   Procedure: ESOPHAGOGASTRODUODENOSCOPY (EGD) WITH PROPOFOL ;  Surgeon: Urban Garden, MD;  Location: [facility] ENDO SUITE;  Service: Gastroenterology;  Laterality: N/A;   ESOPHAGOGASTRODUODENOSCOPY (EGD) WITH PROPOFOL  N/A 02/18/2023   Procedure: ESOPHAGOGASTRODUODENOSCOPY (EGD) WITH PROPOFOL ;  Surgeon: Suzette Espy, MD;  Location: [facility] ENDO SUITE;  Service: Endoscopy;  Laterality: N/A;   GIVENS CAPSULE STUDY N/A 02/19/2023  Procedure: GIVENS CAPSULE STUDY;  Surgeon: Hargis Lias, MD;  Location: [facility] ENDO SUITE;  Service: Endoscopy;  Laterality: N/A;   HEMOSTASIS CLIP PLACEMENT  09/16/2021   Procedure: HEMOSTASIS CLIP PLACEMENT;  Surgeon: Kenney Peacemaker, MD;  Location: [facility] ENDOSCOPY;  Service: Gastroenterology;;   HOT HEMOSTASIS N/A 09/16/2021   Procedure: HOT HEMOSTASIS (ARGON PLASMA COAGULATION/BICAP);  Surgeon: Kenney Peacemaker, MD;  Location: [facility] ENDOSCOPY;  Service: Gastroenterology;  Laterality: N/A;   HOT HEMOSTASIS  10/11/2021   Procedure: HOT HEMOSTASIS (ARGON PLASMA COAGULATION/BICAP);  Surgeon: Umberto Ganong, Bearl Limes, MD;  Location: [facility] ENDO SUITE;  Service: Gastroenterology;;   HOT HEMOSTASIS  02/18/2023   Procedure: HOT HEMOSTASIS (ARGON PLASMA COAGULATION/BICAP);  Surgeon: Suzette Espy, MD;  Location: [facility] ENDO SUITE;  Service: Endoscopy;;   INSERTION OF ILIAC STENT Right 08/20/2020   Procedure: RETROGRADE INSERTION OF RIGHT ILIAC STENT;  Surgeon: Young Hensen, MD;  Location: [facility] OR;  Service: Vascular;  Laterality: Right;   INTRAOPERATIVE ARTERIOGRAM Right 08/20/2020   Procedure: INTRA OPERATIVE ARTERIOGRAM ILIAC;  Surgeon: Young Hensen, MD;  Location: [facility] OR;  Service: Vascular;  Laterality: Right;   PATCH ANGIOPLASTY Right 08/20/2020   Procedure: PATCH ANGIOPLASTY RIGHT FEMORAL ARTERY;  Surgeon: Young Hensen, MD;  Location: [facility] OR;  Service: Vascular;  Laterality: Right;   PERIPHERAL  VASCULAR BALLOON ANGIOPLASTY Right 05/30/2021   Procedure: PERIPHERAL VASCULAR BALLOON ANGIOPLASTY;  Surgeon: Young Hensen, MD;  [facility]: [facility] INVASIVE CV LAB;  Service: Cardiovascular;  Laterality: Right;   PERIPHERAL VASCULAR INTERVENTION Right 02/05/2023   Procedure: PERIPHERAL VASCULAR INTERVENTION;  Surgeon: Young Hensen, MD;  Location: [facility] INVASIVE CV LAB;  Service: Cardiovascular;  Laterality: Right;  Rt SFA and Rt Ext Iliac   PORTACATH PLACEMENT Left 06/13/2016   Procedure: INSERTION PORT-A-CATH;  Surgeon: Alanda Allegra, MD;  Location: [facility] ORS;  Service: General;  Laterality: Left;   TRANSURETHRAL RESECTION OF PROSTATE N/A 05/31/2020   Procedure: TRANSURETHRAL RESECTION OF THE PROSTATE (TURP);  Surgeon: Marco Severs, MD;  Location: [facility] ORS;  Service: Urology;  Laterality: N/A;   VIDEO BRONCHOSCOPY WITH ENDOBRONCHIAL NAVIGATION N/A 05/28/2016   Procedure: VIDEO BRONCHOSCOPY WITH ENDOBRONCHIAL NAVIGATION;  Surgeon: Zelphia Higashi, MD;  Location: [facility] OR;  Service: Thoracic;  Laterality: N/A;   VIDEO BRONCHOSCOPY WITH ENDOBRONCHIAL NAVIGATION Bilateral 08/03/2023   Procedure: VIDEO BRONCHOSCOPY WITH ENDOBRONCHIAL NAVIGATION;  Surgeon: Denson Flake, MD;  Location: [facility] ENDOSCOPY;  Service: Pulmonary;  Laterality: Bilateral;  WITH FLUORO   VIDEO BRONCHOSCOPY WITH ENDOBRONCHIAL ULTRASOUND N/A 05/28/2016   Procedure: VIDEO BRONCHOSCOPY WITH ENDOBRONCHIAL ULTRASOUND;  Surgeon: Zelphia Higashi, MD;  Location: [facility] OR;  Service: Thoracic;  Laterality: N/A;    Social History: Social History   Socioeconomic History   Marital status: Significant Other    Spouse name: Not on file   Number of children: 5   Years of education: 10   Highest education level: 10th grade  Occupational History   Occupation: Retired  Tobacco Use   Smoking status: Every Day    Current packs/day: 0.50    Average packs/day: 0.5 packs/day for 62.5 years (31.3 ttl pk-yrs)    Types: Cigarettes     Start date: 03/11/1961   Smokeless tobacco: Never   Tobacco comments:    1/2 pack to 1 pack per day- 07/30/2023, informed to not smoke 24 hrs prior to procedure.  Vaping Use   Vaping status: Never Used  Substance and Sexual Activity   Alcohol use: No   Drug  use: No   Sexual activity: Yes    Birth control/protection: None  Other Topics Concern   Not on file  Social History Narrative   [name] is his POA and lives with him. Children out of town - one in [facility], others in [place].   Social Drivers of Corporate investment banker Strain: Low Risk  (08/06/2023)   Received from [facility]   Overall Financial Resource Strain (CARDIA)    Difficulty of Paying Living Expenses: Not hard at all  Food Insecurity: No Food Insecurity (08/10/2023)   Hunger Vital Sign    Worried About Running Out of Food in the Last Year: Never true    Ran Out of Food in the Last Year: Never true  Transportation Needs: No Transportation Needs (08/10/2023)   PRAPARE - Administrator, Civil Service (Medical): No    Lack of Transportation (Non-Medical): No  Physical Activity: Insufficiently Active (05/30/2023)   Exercise Vital Sign    Days of Exercise per Week: 3 days    Minutes of Exercise per Session: 10 min  Stress: No Stress Concern Present (05/30/2023)   [employer] of Occupational Health - Occupational Stress Questionnaire    Feeling of Stress : Not at all  Social Connections: Moderately Isolated (05/30/2023)   Social Connection and Isolation Panel    Frequency of Communication with Friends and Family: Twice a week    Frequency of Social Gatherings with Friends and Family: Twice a week    Attends Religious Services: Never    Database administrator or Organizations: No    Attends Banker Meetings: Never    Marital Status: Married  Catering manager Violence: Not At Risk (08/10/2023)   Humiliation, Afraid, Rape, and Kick questionnaire    Fear of Current or Ex-Partner: No     Emotionally Abused: No    Physically Abused: No    Sexually Abused: No    Family History: Family History  Problem Relation Age of Onset   Hypertension Mother    Diabetes Father    Heart disease Father    Stroke Father    Hypertension Sister    Sleep apnea Neg Hx     Current Medications:  Current Outpatient Medications:    albuterol  (VENTOLIN  HFA) 108 (90 Base) MCG/ACT inhaler, INHALE 2 PUFFS INTO THE LUNGS EVERY 6 HOURS AS NEEDED FOR WHEEZE OR SHORTNESS OF BREATH, Disp: 8.5 each, Rfl: 1   allopurinol  (ZYLOPRIM ) 300 MG tablet, Take 1 tablet (300 mg total) by mouth daily., Disp: 90 tablet, Rfl: 0   Blood Glucose Monitoring Suppl (ONETOUCH VERIO REFLECT) w/Device KIT, Use to test blood sugars daily as directed. DX: E11.9, Disp: 1 kit, Rfl: 1   clopidogrel  (PLAVIX ) 75 MG tablet, TAKE 1 TABLET BY MOUTH EVERY DAY, Disp: 90 tablet, Rfl: 3   Continuous Glucose Receiver (FREESTYLE LIBRE 3 READER) DEVI, 1 Device by Does not apply route every 14 (fourteen) days., Disp: 1 each, Rfl: 3   Continuous Glucose Sensor (FREESTYLE LIBRE 3 SENSOR) MISC, 1 Device by Does not apply route continuous., Disp: 1 each, Rfl: 2   diltiazem  (CARDIZEM  CD) 120 MG 24 hr capsule, Take 1 capsule (120 mg total) by mouth daily as needed., Disp: 90 capsule, Rfl: 3   docusate sodium  (COLACE) 100 MG capsule, Take 1 capsule (100 mg total) by mouth daily as needed for mild constipation., Disp: , Rfl:    dutasteride  (AVODART ) 0.5 MG capsule, Take 1 capsule (0.5 mg  total) by mouth daily., Disp: 90 capsule, Rfl: 3   famotidine  (PEPCID ) 20 MG tablet, Take 1 tablet (20 mg total) by mouth daily., Disp: 30 tablet, Rfl: 1   ferrous sulfate  325 (65 FE) MG EC tablet, Take 1 tablet (325 mg total) by mouth daily., Disp: , Rfl:    Fluticasone -Umeclidin-Vilant (TRELEGY ELLIPTA ) 100-62.5-25 MCG/ACT AEPB, TAKE 1 PUFF BY MOUTH EVERY DAY, Disp: 60 each, Rfl: 0   gabapentin  (NEURONTIN ) 300 MG capsule, Take 1 capsule (300 mg total) by mouth 3  (three) times daily., Disp: 270 capsule, Rfl: 2   insulin  degludec (TRESIBA  FLEXTOUCH) 100 UNIT/ML FlexTouch Pen, Inject 20 Units into the skin at bedtime., Disp: 18 mL, Rfl: 1   Insulin  Pen Needle (BD PEN NEEDLE NANO U/F) 32G X 4 MM MISC, Use to inject insulin  once daily, Disp: 100 each, Rfl: 3   levothyroxine  (SYNTHROID ) 50 MCG tablet, Take 1 tablet (50 mcg total) by mouth daily., Disp: 90 tablet, Rfl: 1   linaclotide  (LINZESS ) 72 MCG capsule, Take 1 capsule (72 mcg total) by mouth daily before breakfast., Disp: 90 capsule, Rfl: 1   megestrol  (MEGACE ) 400 MG/10ML suspension, Take 10 mLs (400 mg total) by mouth 2 (two) times daily., Disp: 480 mL, Rfl: 3   metolazone  (ZAROXOLYN ) 2.5 MG tablet, Take 1 tablet (2.5 mg total) by mouth 3 (three) times a week., Disp: 45 tablet, Rfl: 2   mupirocin ointment (BACTROBAN) 2 %, Apply topically 2 (two) times daily., Disp: , Rfl:    NON FORMULARY, CPAP at bedtime, Disp: , Rfl:    ondansetron  (ZOFRAN ) 4 MG tablet, Take 1 tablet (4 mg total) by mouth every 8 (eight) hours as needed for nausea or vomiting., Disp: 90 tablet, Rfl: 1   OneTouch Delica Lancets 30G MISC, Use to test blood sugars daily as directed. DX: E11.9, Disp: 100 each, Rfl: 1   OXYGEN , Inhale 2 L into the lungs daily as needed (with Cpap at night)., Disp: , Rfl:    pantoprazole  (PROTONIX ) 40 MG tablet, Take 1 tablet (40 mg total) by mouth daily., Disp: 90 tablet, Rfl: 0   pravastatin  (PRAVACHOL ) 40 MG tablet, TAKE 1 TABLET (40 MG TOTAL) BY MOUTH ON MONDAYS , WEDNESDAYS, AND FRIDAYS AS DIRECTED, Disp: 48 tablet, Rfl: 4   predniSONE  (STERAPRED UNI-PAK 21 TAB) 10 MG (21) TBPK tablet, Use as directed, Disp: 21 tablet, Rfl: 0   propranolol  (INDERAL ) 10 MG tablet, Take 1 tablet (10 mg total) by mouth 3 (three) times daily as needed., Disp: 90 tablet, Rfl: 6   spironolactone  (ALDACTONE ) 25 MG tablet, Take 1 tablet (25 mg total) by mouth 2 (two) times daily., Disp: 180 tablet, Rfl: 3   torsemide  (DEMADEX )  20 MG tablet, TAKE 1/2 TABLET BY MOUTH DAILY, Disp: 45 tablet, Rfl: 0   zolpidem  (AMBIEN ) 5 MG tablet, Take 1 tablet (5 mg total) by mouth at bedtime as needed for sleep., Disp: 30 tablet, Rfl: 2 No current facility-administered medications for this visit.  Facility-Administered Medications Ordered in Other Visits:    sodium chloride  flush (NS) 0.9 % injection 10 mL, 10 mL, Intravenous, PRN, Eleni Frank, MD, 10 mL at 09/17/23 1148   Allergies: Allergies  Allergen Reactions   Benadryl  [Diphenhydramine ] Other (See Comments)    Extreme leg pain and extreme somnolent      Bisoprolol  Other (See Comments)    Confusion, delirium    Sulfa  Antibiotics Swelling    Mouth swelling   Atorvastatin Other (See Comments)    Muscle  aches - tolerating Pravastatin  40 mg MWF   Jardiance  [Empagliflozin ] Other (See Comments)    FEELS SLUGGISH, TIRED   Lopressor  [Metoprolol ] Other (See Comments)    Fatigue   Rosuvastatin Other (See Comments)    Muscle aches - tolerating Pravastatin  40 mg MWF   Doxycycline  Nausea Only   Temazepam  Other (See Comments)    Made insomnia worse    Tramadol  Itching    REVIEW OF SYSTEMS:   Review of Systems  Constitutional:  Positive for fatigue. Negative for chills and fever.       +generalized pain throughout the body, 7/10 severity  HENT:   Negative for lump/mass, mouth sores, nosebleeds, sore throat and trouble swallowing.   Eyes:  Negative for eye problems.  Respiratory:  Positive for cough and shortness of breath.   Cardiovascular:  Negative for chest pain, leg swelling and palpitations.  Gastrointestinal:  Positive for diarrhea and nausea. Negative for abdominal pain, constipation and vomiting.  Genitourinary:  Negative for bladder incontinence, difficulty urinating, dysuria, frequency, hematuria and nocturia.   Musculoskeletal:  Negative for arthralgias, back pain, flank pain, myalgias and neck pain.  Skin:  Negative for itching and rash.  Neurological:   Positive for dizziness, headaches and numbness (and tingling in hands and feet).  Hematological:  Does not bruise/bleed easily.  Psychiatric/Behavioral:  Positive for depression and sleep disturbance. Negative for suicidal ideas. The patient is nervous/anxious.   All other systems reviewed and are negative.    VITALS:   Blood pressure (!) 118/48, pulse (!) 126.  Wt Readings from Last 3 Encounters:  09/09/23 152 lb 3.2 oz (69 kg)  09/08/23 151 lb (68.5 kg)  09/04/23 150 lb 9.6 oz (68.3 kg)    There is no height or weight on file to calculate BMI.  Performance status (ECOG): 1 - Symptomatic but completely ambulatory  PHYSICAL EXAM:   Physical Exam Vitals and nursing note reviewed. Exam conducted with a chaperone present.  Constitutional:      Appearance: Normal appearance.   Cardiovascular:     Rate and Rhythm: Normal rate and regular rhythm.     Pulses: Normal pulses.     Heart sounds: Normal heart sounds.  Pulmonary:     Effort: Pulmonary effort is normal.     Breath sounds: Examination of the right-lower field reveals decreased breath sounds. Examination of the left-lower field reveals decreased breath sounds. Decreased breath sounds present.  Abdominal:     Palpations: Abdomen is soft. There is no hepatomegaly, splenomegaly or mass.     Tenderness: There is no abdominal tenderness.   Musculoskeletal:     Right lower leg: No edema.     Left lower leg: No edema.  Lymphadenopathy:     Cervical: No cervical adenopathy.     Right cervical: No superficial, deep or posterior cervical adenopathy.    Left cervical: No superficial, deep or posterior cervical adenopathy.     Upper Body:     Right upper body: No supraclavicular or axillary adenopathy.     Left upper body: No supraclavicular or axillary adenopathy.   Neurological:     General: No focal deficit present.     Mental Status: He is alert and oriented to person, place, and time.   Psychiatric:        Mood and  Affect: Mood normal.        Behavior: Behavior normal.     LABS:   CBC     Component Value Date/Time   WBC  8.3 09/17/2023 0909   RBC 3.03 (L) 09/17/2023 0909   HGB 10.1 (L) 09/17/2023 0909   HGB 13.0 08/12/2023 1230   HCT 30.8 (L) 09/17/2023 0909   HCT 38.2 08/12/2023 1230   PLT 131 (L) 09/17/2023 0909   PLT 250 08/12/2023 1230   MCV 101.7 (H) 09/17/2023 0909   MCV 96 08/12/2023 1230   MCH 33.3 09/17/2023 0909   MCHC 32.8 09/17/2023 0909   RDW 15.9 (H) 09/17/2023 0909   RDW 14.4 08/12/2023 1230   LYMPHSABS 0.7 09/17/2023 0909   LYMPHSABS 0.7 08/12/2023 1230   MONOABS 0.5 09/17/2023 0909   EOSABS 0.1 09/17/2023 0909   EOSABS 0.2 08/12/2023 1230   BASOSABS 0.0 09/17/2023 0909   BASOSABS 0.0 08/12/2023 1230    CMP      Component Value Date/Time   NA 136 09/17/2023 0909   NA 138 08/12/2023 1230   K 4.5 09/17/2023 0909   CL 99 09/17/2023 0909   CO2 23 09/17/2023 0909   GLUCOSE 147 (H) 09/17/2023 0909   BUN 20 09/17/2023 0909   BUN 14 08/12/2023 1230   CREATININE 1.01 09/17/2023 0909   CALCIUM 8.6 (L) 09/17/2023 0909   PROT 6.6 09/17/2023 0909   PROT 6.2 08/12/2023 1230   ALBUMIN 2.9 (L) 09/17/2023 0909   ALBUMIN 3.7 (L) 08/12/2023 1230   AST 17 09/17/2023 0909   ALT 15 09/17/2023 0909   ALKPHOS 117 09/17/2023 0909   BILITOT 0.6 09/17/2023 0909   BILITOT 0.4 08/12/2023 1230   GFRNONAA >60 09/17/2023 0909   GFRAA 102 05/24/2020 1238     No results found for: CEA1, CEA / No results found for: CEA1, CEA Lab Results  Component Value Date   PSA1 <0.1 10/31/2022   No results found for: AOZ308 No results found for: MVH846  Lab Results  Component Value Date   TOTALPROTELP 7.0 04/18/2021   ALBUMINELP 3.7 04/18/2021   A1GS 0.3 04/18/2021   A2GS 0.8 04/18/2021   BETS 1.2 04/18/2021   GAMS 1.0 04/18/2021   MSPIKE Not Observed 04/18/2021   SPEI Comment 04/18/2021   Lab Results  Component Value Date   TIBC 332 06/29/2023   TIBC 425 03/24/2023    TIBC 541 (H) 02/17/2023   FERRITIN 185 06/29/2023   FERRITIN 29 03/24/2023   FERRITIN 10 (L) 02/17/2023   IRONPCTSAT 25 06/29/2023   IRONPCTSAT 49 (H) 03/24/2023   IRONPCTSAT 4 (L) 02/17/2023   Lab Results  Component Value Date   LDH 133 08/02/2018   LDH 126 03/08/2018   LDH 141 10/28/2017     STUDIES:   VAS US  LOWER EXTREMITY ARTERIAL DUPLEX Result Date: 09/08/2023 LOWER EXTREMITY ARTERIAL DUPLEX STUDY Patient Name:  KOLTAN PORTOCARRERO  Date of Exam:   09/08/2023 Medical Rec #: [id number]         Accession #:    [id number] Date of Birth: 1945-07-27         Patient Gender: M Patient Age:   44 years Exam Location:  [address] Procedure:      VAS US  LOWER EXTREMITY ARTERIAL DUPLEX Referring Phys: Jimmye Moulds --------------------------------------------------------------------------------  High Risk Factors: Hypertension, hyperlipidemia, Diabetes, current smoker.  Vascular Interventions: 08/20/20: Angioplasty and stent right CIA.                         Right CFA endarterectomy, profundoplasty and pericardial  patch angioplasty.                         05/30/21: Right prox SFA, dist SFA, and AK pop DCBA.                         Right prox CIA DCBA                         02/05/23: Right angioplasty and stent SFA and EIA. Current ABI:            Right 0.97/0.66 Left 0.84/0.49 Comparison Study: 02/25/23 at [facility] Radiology: No evidence of significant inflow,                   outflow or runoff disease.                   Patent metallic stent present within the SFA Performing Technologist: Helon Lobos RVT  Examination Guidelines: A complete evaluation includes B-mode imaging, spectral Doppler, color Doppler, and power Doppler as needed of all accessible portions of each vessel. Bilateral testing is considered an integral part of a complete examination. Limited examinations for reoccurring indications may be performed as noted.   +-----------+--------+-----+--------+--------+--------+ RIGHT      PSV cm/sRatioStenosisWaveformComments +-----------+--------+-----+--------+--------+--------+ CFA Distal 69                   biphasic         +-----------+--------+-----+--------+--------+--------+ DFA        88                   biphasic         +-----------+--------+-----+--------+--------+--------+ SFA Prox   130                  biphasic         +-----------+--------+-----+--------+--------+--------+ SFA Mid                                 stent    +-----------+--------+-----+--------+--------+--------+ SFA Distal                              stent    +-----------+--------+-----+--------+--------+--------+ POP Prox   56                   biphasic         +-----------+--------+-----+--------+--------+--------+ POP Distal 46                   biphasic         +-----------+--------+-----+--------+--------+--------+ ATA Distal 62                   biphasic         +-----------+--------+-----+--------+--------+--------+ PTA Distal 58                   biphasic         +-----------+--------+-----+--------+--------+--------+ PERO Distal42                   biphasic         +-----------+--------+-----+--------+--------+--------+  Right Stent(s): +-----------------+--------+--------+--------+--------+ mid to distal SFAPSV cm/sStenosisWaveformComments +-----------------+--------+--------+--------+--------+ Prox to Stent    96              biphasic         +-----------------+--------+--------+--------+--------+  Proximal Stent   79              biphasic         +-----------------+--------+--------+--------+--------+ Mid Stent        90              biphasic         +-----------------+--------+--------+--------+--------+ Distal Stent     88              biphasic         +-----------------+--------+--------+--------+--------+ Distal to Stent  104              biphasic         +-----------------+--------+--------+--------+--------+  Summary: Right: Patent SFA stent with no stenosis.  See table(s) above for measurements and observations. Electronically signed by Jimmye Moulds MD on 09/08/2023 at 11:00:41 AM.    Final    VAS US  AORTA/IVC/ILIACS Result Date: 09/08/2023 ABDOMINAL AORTA STUDY Patient Name:  DRAE MITZEL  Date of Exam:   09/08/2023 Medical Rec #: [id number]         Accession #:    [id number] Date of Birth: [birth year]         Patient Gender: M Patient Age:   21 years Exam Location:  [address] Procedure:      VAS US  AORTA/IVC/ILIACS Referring Phys: Jimmye Moulds --------------------------------------------------------------------------------  Risk Factors: Hypertension, Diabetes, current smoker. Vascular Interventions: 08/20/20: Angioplasty and stent right CIA.                         Right CFA endarterectomy, profundoplasty and pericardial                         patch angioplasty.                         05/30/21: Right prox SFA, dist SFA, and AK pop DCBA.                         Right prox CIA DCBA                         02/05/23: Right angioplasty and stent SFA and EIA.  Performing Technologist: Helon Lobos RVT  Examination Guidelines: A complete evaluation includes B-mode imaging, spectral Doppler, color Doppler, and power Doppler as needed of all accessible portions of each vessel. Bilateral testing is considered an integral part of a complete examination. Limited examinations for reoccurring indications may be performed as noted.  Abdominal Aorta Findings: +-------------+-------+----------+----------+--------+--------+----------------+ Location     AP (cm)Trans (cm)PSV (cm/s)WaveformThrombusComments         +-------------+-------+----------+----------+--------+--------+----------------+ Distal                        41                                          +-------------+-------+----------+----------+--------+--------+----------------+ RT CIA Prox                   310       biphasic        Inflow or  proximal stent   +-------------+-------+----------+----------+--------+--------+----------------+ RT CIA Mid                    173       biphasic                         +-------------+-------+----------+----------+--------+--------+----------------+ RT CIA Distal                 202       biphasic                         +-------------+-------+----------+----------+--------+--------+----------------+ RT EIA Prox                   178       biphasic                         +-------------+-------+----------+----------+--------+--------+----------------+ RT EIA Mid                    213       biphasic                         +-------------+-------+----------+----------+--------+--------+----------------+ RT EIA Distal                 186       biphasic                         +-------------+-------+----------+----------+--------+--------+----------------+  Summary: Stenosis: >50% stenosis at the inflow/proximal right CIA stent.   *See table(s) above for measurements and observations.  Electronically signed by Jimmye Moulds MD on 09/08/2023 at 11:00:23 AM.    Final    VAS US  ABI WITH/WO TBI Result Date: 09/08/2023  LOWER EXTREMITY DOPPLER STUDY Patient Name:  FRANTZ QUATTRONE  Date of Exam:   09/08/2023 Medical Rec #: [id number]         Accession #:    [id number] Date of Birth: Aug 18, 1945         Patient Gender: M Patient Age:   43 years Exam Location:  [address] Procedure:      VAS US  ABI WITH/WO TBI Referring Phys: Jimmye Moulds --------------------------------------------------------------------------------  Indications: Peripheral artery disease. High Risk Factors: Hypertension, hyperlipidemia, Diabetes, current smoker.  Vascular  Interventions: 08/20/20: Angioplasty and stent right CIA.                         Right CFA endarterectomy, profundoplasty and pericardial                         patch angioplasty.                         05/30/21: Right prox SFA, dist SFA, and AK pop DCBA.                         Right prox CIA DCBA                         02/05/23: Right angioplasty and stent SFA and EIA. Comparison Study: 02/25/23 at [facility] Radiology: No evidence of significant inflow,  outflow or runoff disease.                   Patent metallic stent present within the SFA. Performing Technologist: Helon Lobos RVT  Examination Guidelines: A complete evaluation includes at minimum, Doppler waveform signals and systolic blood pressure reading at the level of bilateral brachial, anterior tibial, and posterior tibial arteries, when vessel segments are accessible. Bilateral testing is considered an integral part of a complete examination. Photoelectric Plethysmograph (PPG) waveforms and toe systolic pressure readings are included as required and additional duplex testing as needed. Limited examinations for reoccurring indications may be performed as noted.  ABI Findings: +---------+------------------+-----+--------+--------+ Right    Rt Pressure (mmHg)IndexWaveformComment  +---------+------------------+-----+--------+--------+ Brachial 158                                     +---------+------------------+-----+--------+--------+ ATA      153               0.97 biphasic         +---------+------------------+-----+--------+--------+ PTA      154               0.97 biphasic         +---------+------------------+-----+--------+--------+ Great Toe105               0.66                  +---------+------------------+-----+--------+--------+ +---------+------------------+-----+----------+-------+ Left     Lt Pressure (mmHg)IndexWaveform  Comment +---------+------------------+-----+----------+-------+  Brachial 158                                      +---------+------------------+-----+----------+-------+ ATA      97                0.61 monophasic        +---------+------------------+-----+----------+-------+ PTA      132               0.84 monophasic        +---------+------------------+-----+----------+-------+ Great Toe78                0.49                   +---------+------------------+-----+----------+-------+ +-------+-----------+-----------+------------+------------+ ABI/TBIToday's ABIToday's TBIPrevious ABIPrevious TBI +-------+-----------+-----------+------------+------------+ Right  0.97       0.66       0.77                     +-------+-----------+-----------+------------+------------+ Left   0.84       0.49       Not done                 +-------+-----------+-----------+------------+------------+  Previous ABI on 02/25/23 at [facility] Radiology.  Summary: Right: Resting right ankle-brachial index is within normal range. The right toe-brachial index is abnormal. Left: The left toe-brachial index is abnormal. *See table(s) above for measurements and observations.  Electronically signed by Jimmye Moulds MD on 09/08/2023 at 11:00:03 AM.    Final    CUP PACEART REMOTE DEVICE CHECK Result Date: 09/07/2023 ILR summary report received. Battery status OK. Normal device function. No new symptom, tachy, brady, or pause episodes. No new AF episodes. 36 AT events, longest 14 hrs, V-rates 115-122 bpm. Increase in frequency of events.  Sending for awareness.  Monthly summary reports and ROV/PRN [place], [facility]

## 2023-09-17 NOTE — Patient Instructions (Signed)
 [facility] [place] - A DEPT OF [place]  Discharge Instructions: Thank you for choosing [facility] to provide your oncology and hematology care.  If you have a lab appointment with the Cancer Center - please note that after April 8th, 2024, all labs will be drawn in the cancer center.  You do not have to check in or register with the main entrance as you have in the past but will complete your check-in in the cancer center.  Wear comfortable clothing and clothing appropriate for easy access to any Portacath or PICC line.   We strive to give you quality time with your provider. You may need to reschedule your appointment if you arrive late (15 or more minutes).  Arriving late affects you and other patients whose appointments are after yours.  Also, if you miss three or more appointments without notifying the office, you may be dismissed from the clinic at the provider's discretion.      For prescription refill requests, have your pharmacy contact our office and allow 72 hours for refills to be completed.    Today you received the following House fluids over 1 hour   BELOW ARE SYMPTOMS THAT SHOULD BE REPORTED IMMEDIATELY: *FEVER GREATER THAN 100.4 F (38 C) OR HIGHER *CHILLS OR SWEATING *NAUSEA AND VOMITING THAT IS NOT CONTROLLED WITH YOUR NAUSEA MEDICATION *UNUSUAL SHORTNESS OF BREATH *UNUSUAL BRUISING OR BLEEDING *URINARY PROBLEMS (pain or burning when urinating, or frequent urination) *BOWEL PROBLEMS (unusual diarrhea, constipation, pain near the anus) TENDERNESS IN MOUTH AND THROAT WITH OR WITHOUT PRESENCE OF ULCERS (sore throat, sores in mouth, or a toothache) UNUSUAL RASH, SWELLING OR PAIN  UNUSUAL VAGINAL DISCHARGE OR ITCHING   Items with * indicate a potential emergency and should be followed up as soon as possible or go to the Emergency Department if any problems should occur.  Please show the CHEMOTHERAPY ALERT CARD or IMMUNOTHERAPY ALERT  CARD at check-in to the Emergency Department and triage nurse.  Should you have questions after your visit or need to cancel or reschedule your appointment, please contact [facility] [place] - A DEPT OF [facility] [phone]  and follow the prompts.  Office hours are 8:00 a.m. to 4:30 p.m. Monday - Friday. Please note that voicemails left after 4:00 p.m. may not be returned until the following business day.  We are closed weekends and major holidays. You have access to a nurse at all times for urgent questions. Please call the main number to the clinic [phone] and follow the prompts.  For any non-urgent questions, you may also contact your provider using MyChart. We now offer e-Visits for anyone 59 and older to request care online for non-urgent symptoms. For details visit [email or web address].   Also download the MyChart app! Go to the app store, search MyChart, open the app, select [place], and log in with your MyChart username and password.

## 2023-09-21 DIAGNOSIS — D630 Anemia in neoplastic disease: Secondary | ICD-10-CM | POA: Diagnosis not present

## 2023-09-21 DIAGNOSIS — I4891 Unspecified atrial fibrillation: Secondary | ICD-10-CM | POA: Diagnosis not present

## 2023-09-21 DIAGNOSIS — I4892 Unspecified atrial flutter: Secondary | ICD-10-CM | POA: Diagnosis not present

## 2023-09-21 DIAGNOSIS — N138 Other obstructive and reflux uropathy: Secondary | ICD-10-CM | POA: Diagnosis not present

## 2023-09-21 DIAGNOSIS — I872 Venous insufficiency (chronic) (peripheral): Secondary | ICD-10-CM | POA: Diagnosis not present

## 2023-09-21 DIAGNOSIS — E1169 Type 2 diabetes mellitus with other specified complication: Secondary | ICD-10-CM | POA: Diagnosis not present

## 2023-09-21 DIAGNOSIS — J4489 Other specified chronic obstructive pulmonary disease: Secondary | ICD-10-CM | POA: Diagnosis not present

## 2023-09-21 DIAGNOSIS — E1159 Type 2 diabetes mellitus with other circulatory complications: Secondary | ICD-10-CM | POA: Diagnosis not present

## 2023-09-21 DIAGNOSIS — I700 Atherosclerosis of aorta: Secondary | ICD-10-CM | POA: Diagnosis not present

## 2023-09-21 DIAGNOSIS — Z794 Long term (current) use of insulin: Secondary | ICD-10-CM | POA: Diagnosis not present

## 2023-09-21 DIAGNOSIS — I6521 Occlusion and stenosis of right carotid artery: Secondary | ICD-10-CM | POA: Diagnosis not present

## 2023-09-21 DIAGNOSIS — K8020 Calculus of gallbladder without cholecystitis without obstruction: Secondary | ICD-10-CM | POA: Diagnosis not present

## 2023-09-21 DIAGNOSIS — R338 Other retention of urine: Secondary | ICD-10-CM | POA: Diagnosis not present

## 2023-09-21 DIAGNOSIS — J9611 Chronic respiratory failure with hypoxia: Secondary | ICD-10-CM | POA: Diagnosis not present

## 2023-09-21 DIAGNOSIS — C3411 Malignant neoplasm of upper lobe, right bronchus or lung: Secondary | ICD-10-CM | POA: Diagnosis not present

## 2023-09-21 DIAGNOSIS — M109 Gout, unspecified: Secondary | ICD-10-CM | POA: Diagnosis not present

## 2023-09-21 DIAGNOSIS — E1142 Type 2 diabetes mellitus with diabetic polyneuropathy: Secondary | ICD-10-CM | POA: Diagnosis not present

## 2023-09-21 DIAGNOSIS — E1151 Type 2 diabetes mellitus with diabetic peripheral angiopathy without gangrene: Secondary | ICD-10-CM | POA: Diagnosis not present

## 2023-09-21 DIAGNOSIS — F5101 Primary insomnia: Secondary | ICD-10-CM | POA: Diagnosis not present

## 2023-09-21 DIAGNOSIS — E782 Mixed hyperlipidemia: Secondary | ICD-10-CM | POA: Diagnosis not present

## 2023-09-21 DIAGNOSIS — L97419 Non-pressure chronic ulcer of right heel and midfoot with unspecified severity: Secondary | ICD-10-CM | POA: Diagnosis not present

## 2023-09-21 DIAGNOSIS — E039 Hypothyroidism, unspecified: Secondary | ICD-10-CM | POA: Diagnosis not present

## 2023-09-21 DIAGNOSIS — I5033 Acute on chronic diastolic (congestive) heart failure: Secondary | ICD-10-CM | POA: Diagnosis not present

## 2023-09-21 DIAGNOSIS — I152 Hypertension secondary to endocrine disorders: Secondary | ICD-10-CM | POA: Diagnosis not present

## 2023-09-22 DIAGNOSIS — E1142 Type 2 diabetes mellitus with diabetic polyneuropathy: Secondary | ICD-10-CM | POA: Diagnosis not present

## 2023-09-22 DIAGNOSIS — L97411 Non-pressure chronic ulcer of right heel and midfoot limited to breakdown of skin: Secondary | ICD-10-CM | POA: Diagnosis not present

## 2023-09-23 NOTE — Progress Notes (Signed)
 [facility] [address], [place] [address]    Clinic Day:  09/24/2023  Referring physician: Yevette Hem, FNP  Patient Care Team: Yevette Hem, FNP as PCP - General (Family Medicine) O'Neal, Cathay Clonts, MD as PCP - Cardiology (Cardiology) Boyce Byes, MD as PCP - Electrophysiology (Cardiology) Paulett Boros, MD as Consulting Physician (Hematology) Delilah Fend, [facility] (Pharmacist) O'Neal, Cathay Clonts, MD as Consulting Physician (Cardiology) McKenzie, Arden Beck, MD as Consulting Physician (Urology) Alexia Idler, OD (Optometry) Wendel Hals, NP as Nurse Practitioner (Endocrinology) Ashok Blake, DPM as Consulting Physician (Podiatry) Young Hensen, MD as Consulting Physician (Vascular Surgery)   ASSESSMENT & PLAN:   Assessment: 1.  Stage IIIa (T1b N2 cM0) NSCLC, favoring adenocarcinoma: -Chemo XRT with weekly carboplatin  and paclitaxel  completed on 08/20/2016. -Consolidation durvalumab  from 09/26/2016 through 09/25/2017. -Reviewed CT chest on 11/07/2019 which showed increasing size of the upper lobe pulmonary nodules with no new nodule in the left upper lobe suspicious for meta stasis.  Also increasing confluent soft tissue in the right suprahilar region, thought to be postradiation changes. -PET scan on 11/21/2019 showed suspected radiation changes in the right paramediastinal/perihilar region although mild hypermetabolism is present in the right retroareolar region.SUV 2.5.  While progressive soft tissue is present in this region, this hypermetabolism remains nonfocal and favors radiation changes.  7 mm central left upper lobe nodule max SUV 1.4.  9 mm aggregate nodule in the lateral right upper lobe max SUV 2.4.  These are minimally progressive from more remote prior scans.  6 mm left lower lobe nodule unchanged.  No hypermetabolic thoracic adenopathy. -CT chest on 03/23/2020 showed many of the lung nodules are stable.  Couple  of nodules have grown by few millimeters.  No new areas seen. - SBRT to the right and left lung lesions, 54 Gray in 3 fractions from 09/10/2020 through 09/17/2020. - SBRT to the RUL and LUL lesions from 06/19/2022 through 06/26/2022 by Dr. Eloise Hake. - PET scan (07/09/2023): LUL mass 3.5 x 5.1 cm with SUV 10.8.  Additional hypermetabolic nodules in the right lung both within the post radiation consolidation in the right upper lobe SUV 10.2 and within the medial right lower lobe SUV 8.4 measuring 2 x 2.4 cm.  No adenopathy. - Bronchoscopy and biopsy on 08/03/2023. - Pathology: RLL FNA squamous cell carcinoma (p40 positive and negative for TTF-1).  LUL FNA: Favor squamous cell carcinoma (positive for p40 and show patchy staining for TTF-1).  RUL endobronchial brushing shows atypical cells. - [id number] (07/25/2023): T p53 mutation.  MSI-high not detected.  TMB not evaluable. - [facility] NGS: MS-stable, TMB-high (13 Mut/Mb), PD-L1 (22 C3) TPS 10%.  Met IHC protein 3+, 90% (Telisotuzumab Vedotin for non-squamous histology), HER2 IHC (1+). - Checkmate 9 LA (carboplatin , paclitaxel , nivolumab  every 3 weeks and ipilimumab  every 6 weeks) started on 09/09/2023 - He has PD-L1 TPS of 10% with no other targetable mutations.  He has MET IHC protein 3+90%, but the treatment is not offered for squamous histology.  GPS AI showed high probability of adenocarcinoma.   2.  Normocytic to macrocytic anemia: - Found to have hemoglobin 6.3 on 10/09/2020 and received 2 units PRBC while hospitalized.  I have reviewed hospitalization records. - EGD on 09/06/2020 with tiny gastric ulcer without bleeding or stigmata. - Blood loss this year most likely postoperative in nature (urological surgery in February and vascular surgery in May).    Plan: 1.  Stage IV squamous cell lung  cancer: - Cycle 1 of chemotherapy on 09/09/2023. - Reported occasional nausea but no vomiting.  Has some constipation and is on Linzess  which is not helping.  He denies any  falls. - Labs today: Normal LFTs with albumin 2.3.  Creatinine is normal.  Magnesium  is low at 1.6.  He will receive IV magnesium .  CBC grossly normal with leukocytosis from G-CSF.  Anemia from myelosuppression. - He is still weak.  I will delay his cycle 2 x 1 week.  RTC 2 weeks for follow-up with repeat labs and treatment.   2.  Normocytic to macrocytic anemia: - Last Monoferric  on 04/10/2023.  Hemoglobin today dropped to 8.7 from myelosuppression.   3.  Decreased appetite/weight loss: -He was reinforced to continue Megace  400 mg twice daily.  Albumin is low at 2.3, down from 2.9 last week.  Recommend adding Ensure/boost for diabetics daily.  4.  Peripheral neuropathy: - Neuropathy in the hands and feet has been stable from diabetes.  Continue gabapentin  3 times daily..  5.  Chronic diastolic CHF/sinus tachycardia: - He is on Cardizem  CD 120 mg daily.  He is taking torsemide  20 mg half tablet daily.  He does not believe he is taking spironolactone  or metolazone .  He has 2+ edema.  Recommend increasing torsemide  to 20-40 mg daily as needed.    No orders of the defined types were placed in this encounter.     Nadeen Augusta Teague,acting as a Neurosurgeon for Paulett Boros, MD.,have documented all relevant documentation on the behalf of Paulett Boros, MD,as directed by  Paulett Boros, MD while in the presence of Paulett Boros, MD.  I, Paulett Boros MD, have reviewed the above documentation for accuracy and completeness, and I agree with the above.      Paulett Boros, MD   6/19/20259:50 AM  CHIEF COMPLAINT:   Diagnosis: right NSCLC, favor adenocarcinoma    Cancer Staging  Non-small cell lung cancer, right [facility]) Staging form: Lung, AJCC 8th Edition - Clinical stage from 06/05/2016: Stage IIIA (cT1b(2), cN2, cM0) - Signed by Doretta Gant, PA-C on 08/21/2016  Primary squamous cell carcinoma of left lung (HCC) Staging form: Lung, AJCC V9 - Clinical  stage from 08/17/2023: Stage IVA (rcT2b, cN0, cM1a) - Unsigned    Prior Therapy: 1. Chemoradiation with weekly carboplatin  and paclitaxel , 06/25/16 - 08/20/16. 2. Consolidation durvalumab , 09/26/16 - 09/25/17. 3. SBRT to RUL, LUL nodules 09/10/20 - 09/17/20 4. SBRT to RUL, LUL nodules 06/19/22 - 06/26/22  Current Therapy: Under workup   HISTORY OF PRESENT ILLNESS:   Oncology History  Non-small cell lung cancer, right (HCC)  04/17/2016 Imaging   CT chest- 1. Examination is positive for bilateral upper lobe pulmonary lesions suspicious for malignancy. Further investigation with PET-CT and tissue sampling is advised. 2. Borderline enlarged paratracheal lymph nodes and right hilar nodes. Cannot rule out metastatic adenopathy. Attention to these areas on PET-CT is advised. 3. Emphysema 4. Aortic atherosclerosis and multi vessel coronary artery calcification.   04/18/2016 Initial Diagnosis   Adenocarcinoma of lung, right (HCC)   04/24/2016 PET scan   1. Adjacent hypermetabolic nodules in the RIGHT upper lobe consistent bronchogenic carcinoma. 2. Suspicion of ipsilateral hilar and paratracheal nodal metastasis. 3. Nodule in the LEFT upper lobe with suspicious morphology has a relatively low metabolic activity for size. Favor synchronous primary bronchogenic carcinoma. 4. No metabolic activity associated with a small LEFT lower lobe pulmonary nodule. Recommend attention on follow-up.   05/06/2016 Imaging   MRI brain- Negative for metastatic  disease.   05/28/2016 Procedure   1. Electromagnetic navigational bronchoscopy with brushings, needle     aspirations and biopsies of bilateral upper lobe nodules. 2. Endobronchial ultrasound with mediastinal and hilar lymph node     aspirations. By Dr. [name]   06/03/2016 Pathology Results   Diagnosis 1. Lung, biopsy, Right upper lobe - BLOOD AND BENIGN BRONCHIAL AND LUNG TISSUE. 2. Lung, biopsy, Left upper lobe - BLOOD AND BENIGN BRONCHIAL AND  LUNG TISSUE.   06/03/2016 Pathology Results   Diagnosis TRANSBRONCHIAL NEEDLE ASPIRATION, NAVIGATION, RUL (SPECIMEN 5 OF 7, COLLECTED 05/28/16): MALIGNANT CELLS CONSISTENT WITH NON-SMALL CELL CARCINOMA Comment There are limited tumor cells in the cell block (insufficient for molecular testing). TTF-1 is positive in a few of the atypical cells. They appear negative for synaptophysin and cytokeratin 5/6. Overall there is limited tumor, but a lung adenocarcinoma is slightly favored. Dr. Portia Brittle has reviewed the case. The case was called to Dr. [name] on 06/03/2016.   06/13/2016 Procedure   Port placed by Dr. Larrie Po   06/25/2016 - 08/15/2016 Chemotherapy   The patient had palonosetron  (ALOXI ) injection 0.25 mg, 0.25 mg, Intravenous,  Once, 6 of 6 cycles  CARBOplatin  (PARAPLATIN ) 220 mg in sodium chloride  0.9 % 250 mL chemo infusion, 220 mg (100 % of original dose 215.8 mg), Intravenous,  Once, 6 of 6 cycles Dose modification:   (original dose 215.8 mg, Cycle 1),   (original dose 215.8 mg, Cycle 5),   (original dose 215.8 mg, Cycle 6),   (original dose 215.8 mg, Cycle 2),   (original dose 215.8 mg, Cycle 3),   (original dose 215.8 mg, Cycle 4)  PACLitaxel  (TAXOL ) 90 mg in dextrose  5 % 250 mL chemo infusion ( for chemotherapy treatment.     06/25/2016 - 08/20/2016 Radiation Therapy   Radiation in [place], [place] by Dr. Denver Flaming.  R lung to 6600 cGy   07/09/2016 - 07/10/2016 Hospital Admission   Admit date: 07/09/2016  Admission diagnosis: Fever and chills Additional comments: Negative work-up, discharged with course of Levaquin    07/09/2016 Treatment Plan Change   Treatment deferred x 1 week due to hospitalization    08/05/2016 - 08/06/2016 Hospital Admission   Admit date: 08/05/2016 Admission diagnosis: Joint pain Additional comments: Treated with steroids with symptomatic improvement   08/25/2016 Imaging   MRI brain- Negative for intracranial metastasis on this motion degraded examination.  Stable  mild-to-moderate chronic small vessel ischemic disease.   09/18/2016 PET scan   1. Partial metabolic response. Right upper lobe 2.6 cm hypermetabolic pulmonary nodule, decreased in size and metabolism. Mildly hypermetabolic right hilar nodal metastasis, decreased in metabolism. Right paratracheal lymph node is no longer hypermetabolic. No new or progressive metastatic disease. 2. Apical left upper lobe 0.9 cm solid pulmonary nodule, slightly decreased in size, with no significant metabolism. The change in size could indicate a neoplastic etiology for this nodule. 3. Additional subcentimeter pulmonary nodules in the left lung are stable and below PET resolution . 4. Additional findings include aortic atherosclerosis, coronary atherosclerosis, moderate emphysema, stable small pericardial effusion/thickening and mild sigmoid diverticulosis.   01/13/2017 Imaging   CT chest with contrast IMPRESSION: 1. No change in size of the hypermetabolic nodular thickening in the RIGHT upper lobe. 2. LEFT upper lobe spiculated nodule is stable. 3. Rounded LEFT lower lobe pulmonary nodule is stable. 4. No evidence of lung cancer progression.   05/08/2017 Adverse Reaction   Development of Hypothyroidism- likely secondary to immunotherapy.  Levothyroxine  therapy initiated.    Primary squamous  cell carcinoma of left lung (HCC)  08/17/2023 Initial Diagnosis   Primary squamous cell carcinoma of left lung (HCC)   09/09/2023 -  Chemotherapy   Patient is on Treatment Plan : LUNG NSCLC SQUAMOUS Nivolumab  + Ipilimumab  + Carboplatin  + Paclitaxel  q42d X 1 cycle / Nivolumab  + Ipilimumab  q42d        INTERVAL HISTORY:   Layth is a 78 y.o. male presenting to clinic today for follow up of right NSCLC, favor adenocarcinoma. He was last seen by me on 09/17/23.  Today, he states that he is doing well overall. His appetite level is at 75%. His energy level is at 35%. Brazen is accompanied by his wife.  PAST MEDICAL  HISTORY:   Past 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   Anxiety    Arthritis    Asthma    Atrial flutter (HCC)    BPH (benign prostatic hyperplasia)    with urinary retention 02/06/20   CHF (congestive heart failure) (HCC)    COPD (chronic obstructive pulmonary disease) (HCC)    Depression    Diabetes mellitus, type II (HCC)    Dyspnea    GERD (gastroesophageal reflux disease)    History of blood transfusion    History of kidney stones    History of radiation therapy    right lung 09/10/2020-09/17/2020   Dr Eloise Hake   History of radiation therapy    Left and right Lung- 06/19/22-06/26/22   Hyperlipidemia    Hypertension    Hypothyroidism    IDA (iron  deficiency anemia)    Macular degeneration    Neuropathy    hands and feet   Non-small cell lung cancer, right (HCC) 04/18/2016   Oxygen  dependent    uses 2L oxygen  as needed   PAD (peripheral artery disease) (HCC) 03/2023   managed by Dr Jimmye Moulds   Peripheral vascular disease [facility])    Prostatitis    Pulmonary nodule, left 07/16/2016   Sleep apnea 2019   uses CPAP nightly    Surgical History: Past Surgical History:  Procedure Laterality Date   A-FLUTTER ABLATION N/A 01/13/2022   Procedure: A-FLUTTER ABLATION;  Surgeon: Boyce Byes, MD;  Location: [facility] INVASIVE CV LAB;  Service: Cardiovascular;  Laterality: N/A;   ABDOMINAL AORTOGRAM W/LOWER EXTREMITY Left 02/06/2020   Procedure: ABDOMINAL AORTOGRAM W/LOWER EXTREMITY;  Surgeon: Avanell Leigh, MD;  Location: [facility] INVASIVE CV LAB;  Service: Cardiovascular;  Laterality: Left;   ABDOMINAL AORTOGRAM W/LOWER EXTREMITY N/A 05/30/2021   Procedure: ABDOMINAL AORTOGRAM W/LOWER EXTREMITY;  Surgeon: Young Hensen, MD;  Location: [facility] INVASIVE CV LAB;  Service: Cardiovascular;  Laterality: N/A;   ABDOMINAL AORTOGRAM W/LOWER EXTREMITY N/A 02/05/2023   Procedure: ABDOMINAL AORTOGRAM W/LOWER EXTREMITY;   Surgeon: Young Hensen, MD;  Location: [facility] INVASIVE CV LAB;  Service: Cardiovascular;  Laterality: N/A;   BIOPSY  09/16/2021   Procedure: BIOPSY;  Surgeon: Kenney Peacemaker, MD;  Location[facility] ENDOSCOPY;  Service: Gastroenterology;;   BRONCHIAL BIOPSY  08/03/2023   Procedure: BRONCHOSCOPY, WITH BIOPSY;  Surgeon: Denson Flake, MD;  Location: [facility] ENDOSCOPY;  Service: Pulmonary;;   BRONCHIAL BRUSHINGS  08/03/2023   Procedure: BRONCHOSCOPY, WITH BRUSH BIOPSY;  Surgeon: Denson Flake, MD;  Location: [facility] ENDOSCOPY;  Service: Pulmonary;;   BRONCHIAL NEEDLE ASPIRATION BIOPSY  08/03/2023   Procedure: BRONCHOSCOPY, WITH NEEDLE ASPIRATION BIOPSY;  Surgeon: Denson Flake, MD;  Location: [facility] ENDOSCOPY;  Service: Pulmonary;;   CARDIOVERSION N/A  10/05/2020   Procedure: CARDIOVERSION;  Surgeon: Harrold Lincoln, MD;  Location: [facility] ENDOSCOPY;  Service: Cardiovascular;  Laterality: N/A;   CATARACT EXTRACTION, BILATERAL Bilateral    COLONOSCOPY WITH PROPOFOL  N/A 06/20/2021   Procedure: COLONOSCOPY WITH PROPOFOL ;  Surgeon: Tobin Forts, MD;  Location: [facility] ENDOSCOPY;  Service: Endoscopy;  Laterality: N/A;   COLONOSCOPY WITH PROPOFOL  N/A 09/16/2021   Procedure: COLONOSCOPY WITH PROPOFOL ;  Surgeon: Kenney Peacemaker, MD;  Location: [facility] ENDOSCOPY;  Service: Gastroenterology;  Laterality: N/A;   ENDARTERECTOMY FEMORAL Right 08/20/2020   Procedure: ENDARTERECTOMY  RIGHT FEMORAL ARTERY;  Surgeon: Young Hensen, MD;  Location[facility] OR;  Service: Vascular;  Laterality: Right;   ENTEROSCOPY N/A 10/11/2021   Procedure: ENTEROSCOPY;  Surgeon: Urban Garden, MD;  Location: [facility] ENDO SUITE;  Service: Gastroenterology;  Laterality: N/A;   ENTEROSCOPY N/A 02/18/2023   Procedure: ENTEROSCOPY;  Surgeon: Suzette Espy, MD;  Location: [facility] ENDO SUITE;  Service: Endoscopy;  Laterality: N/A;  EGD with possible small bowel push enteroscopy   ESOPHAGOGASTRODUODENOSCOPY N/A 09/16/2021   Procedure:  ESOPHAGOGASTRODUODENOSCOPY (EGD);  Surgeon: Kenney Peacemaker, MD;  Location: [facility] ENDOSCOPY;  Service: Gastroenterology;  Laterality: N/A;   ESOPHAGOGASTRODUODENOSCOPY (EGD) WITH PROPOFOL  N/A 09/06/2020   Procedure: ESOPHAGOGASTRODUODENOSCOPY (EGD) WITH PROPOFOL ;  Surgeon: Tobin Forts, MD;  Location: [facility] ENDOSCOPY;  Service: Endoscopy;  Laterality: N/A;   ESOPHAGOGASTRODUODENOSCOPY (EGD) WITH PROPOFOL  N/A 10/11/2021   Procedure: ESOPHAGOGASTRODUODENOSCOPY (EGD) WITH PROPOFOL ;  Surgeon: Urban Garden, MD;  Location: [facility] ENDO SUITE;  Service: Gastroenterology;  Laterality: N/A;   ESOPHAGOGASTRODUODENOSCOPY (EGD) WITH PROPOFOL  N/A 02/18/2023   Procedure: ESOPHAGOGASTRODUODENOSCOPY (EGD) WITH PROPOFOL ;  Surgeon: Suzette Espy, MD;  Location: [facility] ENDO SUITE;  Service: Endoscopy;  Laterality: N/A;   GIVENS CAPSULE STUDY N/A 02/19/2023   Procedure: GIVENS CAPSULE STUDY;  Surgeon: Hargis Lias, MD;  Location: [facility] ENDO SUITE;  Service: Endoscopy;  Laterality: N/A;   HEMOSTASIS CLIP PLACEMENT  09/16/2021   Procedure: HEMOSTASIS CLIP PLACEMENT;  Surgeon: Kenney Peacemaker, MD;  Location: [facility] ENDOSCOPY;  Service: Gastroenterology;;   HOT HEMOSTASIS N/A 09/16/2021   Procedure: HOT HEMOSTASIS (ARGON PLASMA COAGULATION/BICAP);  Surgeon: Kenney Peacemaker, MD;  Location: [facility] ENDOSCOPY;  Service: Gastroenterology;  Laterality: N/A;   HOT HEMOSTASIS  10/11/2021   Procedure: HOT HEMOSTASIS (ARGON PLASMA COAGULATION/BICAP);  Surgeon: Umberto Ganong, Bearl Limes, MD;  Location: [facility] ENDO SUITE;  Service: Gastroenterology;;   HOT HEMOSTASIS  02/18/2023   Procedure: HOT HEMOSTASIS (ARGON PLASMA COAGULATION/BICAP);  Surgeon: Suzette Espy, MD;  Location: [facility] ENDO SUITE;  Service: Endoscopy;;   INSERTION OF ILIAC STENT Right 08/20/2020   Procedure: RETROGRADE INSERTION OF RIGHT ILIAC STENT;  Surgeon: Young Hensen, MD;  Location: [facility] OR;  Service: Vascular;  Laterality: Right;   INTRAOPERATIVE ARTERIOGRAM Right  08/20/2020   Procedure: INTRA OPERATIVE ARTERIOGRAM ILIAC;  Surgeon: Young Hensen, MD;  Location: [facility] OR;  Service: Vascular;  Laterality: Right;   PATCH ANGIOPLASTY Right 08/20/2020   Procedure: PATCH ANGIOPLASTY RIGHT FEMORAL ARTERY;  Surgeon: Young Hensen, MD;  Location: [facility] OR;  Service: Vascular;  Laterality: Right;   PERIPHERAL VASCULAR BALLOON ANGIOPLASTY Right 05/30/2021   Procedure: PERIPHERAL VASCULAR BALLOON ANGIOPLASTY;  Surgeon: Young Hensen, MD;  Location: [facility] INVASIVE CV LAB;  Service: Cardiovascular;  Laterality: Right;   PERIPHERAL VASCULAR INTERVENTION Right 02/05/2023   Procedure: PERIPHERAL VASCULAR INTERVENTION;  Surgeon: Young Hensen, MD;  Location: [facility] INVASIVE CV LAB;  Service: Cardiovascular;  Laterality: Right;  Rt SFA  and Rt Ext Iliac   PORTACATH PLACEMENT Left 06/13/2016   Procedure: INSERTION PORT-A-CATH;  Surgeon: Alanda Allegra, MD;  Location: [facility] ORS;  Service: General;  Laterality: Left;   TRANSURETHRAL RESECTION OF PROSTATE N/A 05/31/2020   Procedure: TRANSURETHRAL RESECTION OF THE PROSTATE (TURP);  Surgeon: Marco Severs, MD;  Location: [facility] ORS;  Service: Urology;  Laterality: N/A;   VIDEO BRONCHOSCOPY WITH ENDOBRONCHIAL NAVIGATION N/A 05/28/2016   Procedure: VIDEO BRONCHOSCOPY WITH ENDOBRONCHIAL NAVIGATION;  Surgeon: Zelphia Higashi, MD;  Location: [facility] OR;  Service: Thoracic;  Laterality: N/A;   VIDEO BRONCHOSCOPY WITH ENDOBRONCHIAL NAVIGATION Bilateral 08/03/2023   Procedure: VIDEO BRONCHOSCOPY WITH ENDOBRONCHIAL NAVIGATION;  Surgeon: Denson Flake, MD;  Location: [facility] ENDOSCOPY;  Service: Pulmonary;  Laterality: Bilateral;  WITH FLUORO   VIDEO BRONCHOSCOPY WITH ENDOBRONCHIAL ULTRASOUND N/A 05/28/2016   Procedure: VIDEO BRONCHOSCOPY WITH ENDOBRONCHIAL ULTRASOUND;  Surgeon: Zelphia Higashi, MD;  Location: [facility] OR;  Service: Thoracic;  Laterality: N/A;    Social History: Social History   Socioeconomic History   Marital  status: Significant Other    Spouse name: Not on file   Number of children: 5   Years of education: 10   Highest education level: 10th grade  Occupational History   Occupation: Retired  Tobacco Use   Smoking status: Every Day    Current packs/day: 0.50    Average packs/day: 0.5 packs/day for 62.5 years (31.3 ttl pk-yrs)    Types: Cigarettes    Start date: 03/11/1961   Smokeless tobacco: Never   Tobacco comments:    1/2 pack to 1 pack per day- 07/30/2023, informed to not smoke 24 hrs prior to procedure.  Vaping Use   Vaping status: Never Used  Substance and Sexual Activity   Alcohol use: No   Drug use: No   Sexual activity: Yes    Birth control/protection: None  Other Topics Concern   Not on file  Social History Narrative   [name] is his POA and lives with him. Children out of town - one in [facility], others in [place].   Social Drivers of Corporate investment banker Strain: Low Risk  (08/06/2023)   Received [facility]   Overall Financial Resource Strain (CARDIA)    Difficulty of Paying Living Expenses: Not hard at all  Food Insecurity: No Food Insecurity (08/10/2023)   Hunger Vital Sign    Worried About Running Out of Food in the Last Year: Never true    Ran Out of Food in the Last Year: Never true  Transportation Needs: No Transportation Needs (08/10/2023)   PRAPARE - Administrator, Civil Service (Medical): No    Lack of Transportation (Non-Medical): No  Physical Activity: Insufficiently Active (05/30/2023)   Exercise Vital Sign    Days of Exercise per Week: 3 days    Minutes of Exercise per Session: 10 min  Stress: No Stress Concern Present (05/30/2023)   [employer] of Occupational Health - Occupational Stress Questionnaire    Feeling of Stress : Not at all  Social Connections: Moderately Isolated (05/30/2023)   Social Connection and Isolation Panel    Frequency of Communication with Friends and Family: Twice a week    Frequency of Social  Gatherings with Friends and Family: Twice a week    Attends Religious Services: Never    Database administrator or Organizations: No    Attends Banker Meetings: Never    Marital Status: Married  Catering manager  Violence: Not At Risk (08/10/2023)   Humiliation, Afraid, Rape, and Kick questionnaire    Fear of Current or Ex-Partner: No    Emotionally Abused: No    Physically Abused: No    Sexually Abused: No    Family History: Family History  Problem Relation Age of Onset   Hypertension Mother    Diabetes Father    Heart disease Father    Stroke Father    Hypertension Sister    Sleep apnea Neg Hx     Current Medications:  Current Outpatient Medications:    albuterol  (VENTOLIN  HFA) 108 (90 Base) MCG/ACT inhaler, INHALE 2 PUFFS INTO THE LUNGS EVERY 6 HOURS AS NEEDED FOR WHEEZE OR SHORTNESS OF BREATH, Disp: 8.5 each, Rfl: 1   allopurinol  (ZYLOPRIM ) 300 MG tablet, Take 1 tablet (300 mg total) by mouth daily., Disp: 90 tablet, Rfl: 0   Blood Glucose Monitoring Suppl (ONETOUCH VERIO REFLECT) w/Device KIT, Use to test blood sugars daily as directed. DX: E11.9, Disp: 1 kit, Rfl: 1   cephALEXin  (KEFLEX ) 500 MG capsule, Take 500 mg by mouth 3 (three) times daily., Disp: , Rfl:    clopidogrel  (PLAVIX ) 75 MG tablet, TAKE 1 TABLET BY MOUTH EVERY DAY, Disp: 90 tablet, Rfl: 3   Continuous Glucose Receiver (FREESTYLE LIBRE 3 READER) DEVI, 1 Device by Does not apply route every 14 (fourteen) days., Disp: 1 each, Rfl: 3   Continuous Glucose Sensor (FREESTYLE LIBRE 3 SENSOR) MISC, 1 Device by Does not apply route continuous., Disp: 1 each, Rfl: 2   diltiazem  (CARDIZEM  CD) 120 MG 24 hr capsule, Take 1 capsule (120 mg total) by mouth daily as needed., Disp: 90 capsule, Rfl: 3   docusate sodium  (COLACE) 100 MG capsule, Take 1 capsule (100 mg total) by mouth daily as needed for mild constipation., Disp: , Rfl:    dutasteride  (AVODART ) 0.5 MG capsule, Take 1 capsule (0.5 mg total) by mouth  daily., Disp: 90 capsule, Rfl: 3   famotidine  (PEPCID ) 20 MG tablet, Take 1 tablet (20 mg total) by mouth daily., Disp: 30 tablet, Rfl: 1   ferrous sulfate  325 (65 FE) MG EC tablet, Take 1 tablet (325 mg total) by mouth daily., Disp: , Rfl:    Fluticasone -Umeclidin-Vilant (TRELEGY ELLIPTA ) 100-62.5-25 MCG/ACT AEPB, TAKE 1 PUFF BY MOUTH EVERY DAY, Disp: 60 each, Rfl: 0   gabapentin  (NEURONTIN ) 300 MG capsule, Take 1 capsule (300 mg total) by mouth 3 (three) times daily., Disp: 270 capsule, Rfl: 2   insulin  degludec (TRESIBA  FLEXTOUCH) 100 UNIT/ML FlexTouch Pen, Inject 20 Units into the skin at bedtime., Disp: 18 mL, Rfl: 1   Insulin  Pen Needle (BD PEN NEEDLE NANO U/F) 32G X 4 MM MISC, Use to inject insulin  once daily, Disp: 100 each, Rfl: 3   levothyroxine  (SYNTHROID ) 50 MCG tablet, Take 1 tablet (50 mcg total) by mouth daily., Disp: 90 tablet, Rfl: 1   linaclotide  (LINZESS ) 72 MCG capsule, Take 1 capsule (72 mcg total) by mouth daily before breakfast., Disp: 90 capsule, Rfl: 1   megestrol  (MEGACE ) 400 MG/10ML suspension, Take 10 mLs (400 mg total) by mouth 2 (two) times daily., Disp: 480 mL, Rfl: 3   metolazone  (ZAROXOLYN ) 2.5 MG tablet, Take 1 tablet (2.5 mg total) by mouth 3 (three) times a week., Disp: 45 tablet, Rfl: 2   mupirocin ointment (BACTROBAN) 2 %, Apply topically 2 (two) times daily., Disp: , Rfl:    NON FORMULARY, CPAP at bedtime, Disp: , Rfl:    ondansetron  (ZOFRAN ) 4  MG tablet, Take 1 tablet (4 mg total) by mouth every 8 (eight) hours as needed for nausea or vomiting., Disp: 90 tablet, Rfl: 1   OneTouch Delica Lancets 30G MISC, Use to test blood sugars daily as directed. DX: E11.9, Disp: 100 each, Rfl: 1   OXYGEN , Inhale 2 L into the lungs daily as needed (with Cpap at night)., Disp: , Rfl:    pantoprazole  (PROTONIX ) 40 MG tablet, Take 1 tablet (40 mg total) by mouth daily., Disp: 90 tablet, Rfl: 0   pravastatin  (PRAVACHOL ) 40 MG tablet, TAKE 1 TABLET (40 MG TOTAL) BY MOUTH ON  MONDAYS , WEDNESDAYS, AND FRIDAYS AS DIRECTED, Disp: 48 tablet, Rfl: 4   predniSONE  (STERAPRED UNI-PAK 21 TAB) 10 MG (21) TBPK tablet, Use as directed, Disp: 21 tablet, Rfl: 0   prochlorperazine  (COMPAZINE ) 5 MG tablet, Take 1 tablet (5 mg total) by mouth every 6 (six) hours as needed for nausea or vomiting., Disp: 30 tablet, Rfl: 2   propranolol  (INDERAL ) 10 MG tablet, Take 1 tablet (10 mg total) by mouth 3 (three) times daily as needed., Disp: 90 tablet, Rfl: 6   spironolactone  (ALDACTONE ) 25 MG tablet, Take 1 tablet (25 mg total) by mouth 2 (two) times daily., Disp: 180 tablet, Rfl: 3   torsemide  (DEMADEX ) 20 MG tablet, TAKE 1/2 TABLET BY MOUTH DAILY, Disp: 45 tablet, Rfl: 0   zolpidem  (AMBIEN ) 5 MG tablet, Take 1 tablet (5 mg total) by mouth at bedtime as needed for sleep., Disp: 30 tablet, Rfl: 2 No current facility-administered medications for this visit.  Facility-Administered Medications Ordered in Other Visits:    0.9 %  sodium chloride  infusion, , Intravenous, Continuous, Hanley Woerner, MD   heparin  lock flush 100 unit/mL, 500 Units, Intravenous, Once, Paulett Boros, MD   magnesium  sulfate IVPB 2 g 50 mL, 2 g, Intravenous, Once, Paulett Boros, MD, Last Rate: 50 mL/hr at 09/24/23 0948, 2 g at 09/24/23 0948   sodium chloride  flush (NS) 0.9 % injection 10 mL, 10 mL, Intracatheter, NOW, Dannell Raczkowski, MD   Allergies: Allergies  Allergen Reactions   Benadryl  [Diphenhydramine ] Other (See Comments)    Extreme leg pain and extreme somnolent      Bisoprolol  Other (See Comments)    Confusion, delirium    Sulfa  Antibiotics Swelling    Mouth swelling   Atorvastatin Other (See Comments)    Muscle aches - tolerating Pravastatin  40 mg MWF   Jardiance  [Empagliflozin ] Other (See Comments)    FEELS SLUGGISH, TIRED   Lopressor  [Metoprolol ] Other (See Comments)    Fatigue   Rosuvastatin Other (See Comments)    Muscle aches - tolerating Pravastatin  40 mg MWF    Doxycycline  Nausea Only   Temazepam  Other (See Comments)    Made insomnia worse    Tramadol  Itching    REVIEW OF SYSTEMS:   Review of Systems  Constitutional:  Negative for chills, fatigue and fever.  HENT:   Negative for lump/mass, mouth sores, nosebleeds, sore throat and trouble swallowing.   Eyes:  Negative for eye problems.  Respiratory:  Negative for cough and shortness of breath.   Cardiovascular:  Negative for chest pain, leg swelling and palpitations.  Gastrointestinal:  Positive for constipation and nausea. Negative for abdominal pain, diarrhea and vomiting.  Genitourinary:  Negative for bladder incontinence, difficulty urinating, dysuria, frequency, hematuria and nocturia.   Musculoskeletal:  Negative for arthralgias, back pain, flank pain, myalgias and neck pain.  Skin:  Negative for itching and rash.  Neurological:  Positive  for numbness. Negative for dizziness and headaches.  Hematological:  Does not bruise/bleed easily.  Psychiatric/Behavioral:  Positive for sleep disturbance. Negative for depression and suicidal ideas. The patient is not nervous/anxious.   All other systems reviewed and are negative.    VITALS:   There were no vitals taken for this visit.  Wt Readings from Last 3 Encounters:  09/09/23 152 lb 3.2 oz (69 kg)  09/08/23 151 lb (68.5 kg)  09/04/23 150 lb 9.6 oz (68.3 kg)    There is no height or weight on file to calculate BMI.  Performance status (ECOG): 1 - Symptomatic but completely ambulatory  PHYSICAL EXAM:   Physical Exam Vitals and nursing note reviewed. Exam conducted with a chaperone present.  Constitutional:      Appearance: Normal appearance.   Cardiovascular:     Rate and Rhythm: Normal rate and regular rhythm.     Pulses: Normal pulses.     Heart sounds: Normal heart sounds.  Pulmonary:     Effort: Pulmonary effort is normal.     Breath sounds: Normal breath sounds.  Abdominal:     Palpations: Abdomen is soft. There is no  hepatomegaly, splenomegaly or mass.     Tenderness: There is no abdominal tenderness.   Musculoskeletal:     Right lower leg: Edema present.     Left lower leg: Edema present.  Lymphadenopathy:     Cervical: No cervical adenopathy.     Right cervical: No superficial, deep or posterior cervical adenopathy.    Left cervical: No superficial, deep or posterior cervical adenopathy.     Upper Body:     Right upper body: No supraclavicular or axillary adenopathy.     Left upper body: No supraclavicular or axillary adenopathy.   Neurological:     General: No focal deficit present.     Mental Status: He is alert and oriented to person, place, and time.   Psychiatric:        Mood and Affect: Mood normal.        Behavior: Behavior normal.     LABS:   CBC     Component Value Date/Time   WBC 15.0 (H) 09/24/2023 0839   RBC 2.63 (L) 09/24/2023 0839   HGB 8.7 (L) 09/24/2023 0839   HGB 13.0 08/12/2023 1230   HCT 27.1 (L) 09/24/2023 0839   HCT 38.2 08/12/2023 1230   PLT 332 09/24/2023 0839   PLT 250 08/12/2023 1230   MCV 103.0 (H) 09/24/2023 0839   MCV 96 08/12/2023 1230   MCH 33.1 09/24/2023 0839   MCHC 32.1 09/24/2023 0839   RDW 17.2 (H) 09/24/2023 0839   RDW 14.4 08/12/2023 1230   LYMPHSABS 0.6 (L) 09/24/2023 0839   LYMPHSABS 0.7 08/12/2023 1230   MONOABS 1.5 (H) 09/24/2023 0839   EOSABS 0.1 09/24/2023 0839   EOSABS 0.2 08/12/2023 1230   BASOSABS 0.1 09/24/2023 0839   BASOSABS 0.0 08/12/2023 1230    CMP      Component Value Date/Time   NA 135 09/24/2023 0839   NA 138 08/12/2023 1230   K 3.6 09/24/2023 0839   CL 99 09/24/2023 0839   CO2 25 09/24/2023 0839   GLUCOSE 123 (H) 09/24/2023 0839   BUN 13 09/24/2023 0839   BUN 14 08/12/2023 1230   CREATININE 0.90 09/24/2023 0839   CALCIUM 8.1 (L) 09/24/2023 0839   PROT 6.5 09/24/2023 0839   PROT 6.2 08/12/2023 1230   ALBUMIN 2.3 (L) 09/24/2023 0839   ALBUMIN 3.7 (  L) 08/12/2023 1230   AST 13 (L) 09/24/2023 0839   ALT 13  09/24/2023 0839   ALKPHOS 150 (H) 09/24/2023 0839   BILITOT 0.4 09/24/2023 0839   BILITOT 0.4 08/12/2023 1230   GFRNONAA >60 09/24/2023 0839   GFRAA 102 05/24/2020 1238     No results found for: CEA1, CEA / No results found for: CEA1, CEA Lab Results  Component Value Date   PSA1 <0.1 10/31/2022   No results found for: [id number] No results found for: BJY782  Lab Results  Component Value Date   TOTALPROTELP 7.0 04/18/2021   ALBUMINELP 3.7 04/18/2021   A1GS 0.3 04/18/2021   A2GS 0.8 04/18/2021   BETS 1.2 04/18/2021   GAMS 1.0 04/18/2021   MSPIKE Not Observed 04/18/2021   SPEI Comment 04/18/2021   Lab Results  Component Value Date   TIBC 332 06/29/2023   TIBC 425 03/24/2023   TIBC 541 (H) 02/17/2023   FERRITIN 185 06/29/2023   FERRITIN 29 03/24/2023   FERRITIN 10 (L) 02/17/2023   IRONPCTSAT 25 06/29/2023   IRONPCTSAT 49 (H) 03/24/2023   IRONPCTSAT 4 (L) 02/17/2023   Lab Results  Component Value Date   LDH 133 08/02/2018   LDH 126 03/08/2018   LDH 141 10/28/2017     STUDIES:   DG Chest 2 View Result Date: 09/19/2023 CLINICAL DATA:  Productive cough for 1 week, COPD, history of non-small cell lung cancer EXAM: CHEST - 2 VIEW COMPARISON:  08/03/2023, 07/09/2023 FINDINGS: Frontal and lateral views of the chest demonstrates stable left chest wall port. 4.3 cm left upper lobe mass compatible with known history of lung cancer. Stable right perihilar consolidation within the right upper and right lower lobes. New ground-glass airspace disease within the left upper lobe could reflect post therapeutic change, infection, or inflammation. Right chest is clear. No effusion or pneumothorax. No acute bony abnormalities. Cardiac silhouette is unremarkable. IMPRESSION: 1. Stable left upper lobe mass consistent with history of lung cancer. 2. Stable right perihilar consolidation within the right upper and right lower lobes consistent with known lung cancer. 3. New ground-glass  airspace disease within the left upper lobe, which may be inflammatory, infectious, or related to post therapeutic change. Electronically Signed   By: Bobbye Burrow M.D.   On: 09/19/2023 21:13   VAS US  LOWER EXTREMITY ARTERIAL DUPLEX Result Date: 09/08/2023 LOWER EXTREMITY ARTERIAL DUPLEX STUDY Patient Name:  John [name]  Date of Exam:   09/08/2023 Medical Rec #: [id number]         Accession #:    [id number] Date of Birth: 05-23-1945         Patient Gender: M Patient Age:   31 years Exam Location:  [address] Procedure:      VAS US  LOWER EXTREMITY ARTERIAL DUPLEX Referring Phys: Jimmye Moulds --------------------------------------------------------------------------------  High Risk Factors: Hypertension, hyperlipidemia, Diabetes, current smoker.  Vascular Interventions: 08/20/20: Angioplasty and stent right CIA.                         Right CFA endarterectomy, profundoplasty and pericardial                         patch angioplasty.                         05/30/21: Right prox SFA, dist SFA, and AK pop DCBA.  Right prox CIA DCBA                         02/05/23: Right angioplasty and stent SFA and EIA. Current ABI:            Right 0.97/0.66 Left 0.84/0.49 Comparison Study: 02/25/23 at [facility] Radiology: No evidence of significant inflow,                   outflow or runoff disease.                   Patent metallic stent present within the SFA Performing Technologist: Helon Lobos RVT  Examination Guidelines: A complete evaluation includes B-mode imaging, spectral Doppler, color Doppler, and power Doppler as needed of all accessible portions of each vessel. Bilateral testing is considered an integral part of a complete examination. Limited examinations for reoccurring indications may be performed as noted.  +-----------+--------+-----+--------+--------+--------+ RIGHT      PSV cm/sRatioStenosisWaveformComments +-----------+--------+-----+--------+--------+--------+ CFA  Distal 69                   biphasic         +-----------+--------+-----+--------+--------+--------+ DFA        88                   biphasic         +-----------+--------+-----+--------+--------+--------+ SFA Prox   130                  biphasic         +-----------+--------+-----+--------+--------+--------+ SFA Mid                                 stent    +-----------+--------+-----+--------+--------+--------+ SFA Distal                              stent    +-----------+--------+-----+--------+--------+--------+ POP Prox   56                   biphasic         +-----------+--------+-----+--------+--------+--------+ POP Distal 46                   biphasic         +-----------+--------+-----+--------+--------+--------+ ATA Distal 62                   biphasic         +-----------+--------+-----+--------+--------+--------+ PTA Distal 58                   biphasic         +-----------+--------+-----+--------+--------+--------+ PERO Distal42                   biphasic         +-----------+--------+-----+--------+--------+--------+  Right Stent(s): +-----------------+--------+--------+--------+--------+ mid to distal SFAPSV cm/sStenosisWaveformComments +-----------------+--------+--------+--------+--------+ Prox to Stent    96              biphasic         +-----------------+--------+--------+--------+--------+ Proximal Stent   79              biphasic         +-----------------+--------+--------+--------+--------+ Mid Stent        90              biphasic         +-----------------+--------+--------+--------+--------+  Distal Stent     88              biphasic         +-----------------+--------+--------+--------+--------+ Distal to Stent  104             biphasic         +-----------------+--------+--------+--------+--------+  Summary: Right: Patent SFA stent with no stenosis.  See table(s) above for measurements  and observations. Electronically signed by Jimmye Moulds MD on 09/08/2023 at 11:00:41 AM.    Final    VAS US  AORTA/IVC/ILIACS Result Date: 09/08/2023 ABDOMINAL AORTA STUDY Patient Name:  RANDOL ZUMSTEIN  Date of Exam:   09/08/2023 Medical Rec #: [id number]         Accession #:    [id number] Date of Birth: 1945/06/09         Patient Gender: M Patient Age:   51 years Exam Location:  [address] Procedure:      VAS US  AORTA/IVC/ILIACS Referring Phys: Jimmye Moulds --------------------------------------------------------------------------------  Risk Factors: Hypertension, Diabetes, current smoker. Vascular Interventions: 08/20/20: Angioplasty and stent right CIA.                         Right CFA endarterectomy, profundoplasty and pericardial                         patch angioplasty.                         05/30/21: Right prox SFA, dist SFA, and AK pop DCBA.                         Right prox CIA DCBA                         02/05/23: Right angioplasty and stent SFA and EIA.  Performing Technologist: Helon Lobos RVT  Examination Guidelines: A complete evaluation includes B-mode imaging, spectral Doppler, color Doppler, and power Doppler as needed of all accessible portions of each vessel. Bilateral testing is considered an integral part of a complete examination. Limited examinations for reoccurring indications may be performed as noted.  Abdominal Aorta Findings: +-------------+-------+----------+----------+--------+--------+----------------+ Location     AP (cm)Trans (cm)PSV (cm/s)WaveformThrombusComments         +-------------+-------+----------+----------+--------+--------+----------------+ Distal                        41                                         +-------------+-------+----------+----------+--------+--------+----------------+ RT CIA Prox                   310       biphasic        Inflow or                                                                 proximal stent   +-------------+-------+----------+----------+--------+--------+----------------+ RT CIA Mid  173       biphasic                         +-------------+-------+----------+----------+--------+--------+----------------+ RT CIA Distal                 202       biphasic                         +-------------+-------+----------+----------+--------+--------+----------------+ RT EIA Prox                   178       biphasic                         +-------------+-------+----------+----------+--------+--------+----------------+ RT EIA Mid                    213       biphasic                         +-------------+-------+----------+----------+--------+--------+----------------+ RT EIA Distal                 186       biphasic                         +-------------+-------+----------+----------+--------+--------+----------------+  Summary: Stenosis: >50% stenosis at the inflow/proximal right CIA stent.   *See table(s) above for measurements and observations.  Electronically signed by Jimmye Moulds MD on 09/08/2023 at 11:00:23 AM.    Final    VAS US  ABI WITH/WO TBI Result Date: 09/08/2023  LOWER EXTREMITY DOPPLER STUDY Patient Name:  BENTLEY FISSEL  Date of Exam:   09/08/2023 Medical Rec #: [id number]         Accession #:    [id number] Date of Birth: 1945-10-11         Patient Gender: M Patient Age:   47 years Exam Location:  [address] Procedure:      VAS US  ABI WITH/WO TBI Referring Phys: Jimmye Moulds --------------------------------------------------------------------------------  Indications: Peripheral artery disease. High Risk Factors: Hypertension, hyperlipidemia, Diabetes, current smoker.  Vascular Interventions: 08/20/20: Angioplasty and stent right CIA.                         Right CFA endarterectomy, profundoplasty and pericardial                         patch angioplasty.                         05/30/21: Right prox SFA, dist  SFA, and AK pop DCBA.                         Right prox CIA DCBA                         02/05/23: Right angioplasty and stent SFA and EIA. Comparison Study: 02/25/23 at [facility] Radiology: No evidence of significant inflow,                   outflow or runoff disease.                   Patent metallic stent present within  the SFA. Performing Technologist: Helon Lobos RVT  Examination Guidelines: A complete evaluation includes at minimum, Doppler waveform signals and systolic blood pressure reading at the level of bilateral brachial, anterior tibial, and posterior tibial arteries, when vessel segments are accessible. Bilateral testing is considered an integral part of a complete examination. Photoelectric Plethysmograph (PPG) waveforms and toe systolic pressure readings are included as required and additional duplex testing as needed. Limited examinations for reoccurring indications may be performed as noted.  ABI Findings: +---------+------------------+-----+--------+--------+ Right    Rt Pressure (mmHg)IndexWaveformComment  +---------+------------------+-----+--------+--------+ Brachial 158                                     +---------+------------------+-----+--------+--------+ ATA      153               0.97 biphasic         +---------+------------------+-----+--------+--------+ PTA      154               0.97 biphasic         +---------+------------------+-----+--------+--------+ Great Toe105               0.66                  +---------+------------------+-----+--------+--------+ +---------+------------------+-----+----------+-------+ Left     Lt Pressure (mmHg)IndexWaveform  Comment +---------+------------------+-----+----------+-------+ Brachial 158                                      +---------+------------------+-----+----------+-------+ ATA      97                0.61 monophasic        +---------+------------------+-----+----------+-------+ PTA      132                0.84 monophasic        +---------+------------------+-----+----------+-------+ Great Toe78                0.49                   +---------+------------------+-----+----------+-------+ +-------+-----------+-----------+------------+------------+ ABI/TBIToday's ABIToday's TBIPrevious ABIPrevious TBI +-------+-----------+-----------+------------+------------+ Right  0.97       0.66       0.77                     +-------+-----------+-----------+------------+------------+ Left   0.84       0.49       Not done                 +-------+-----------+-----------+------------+------------+  Previous ABI on 02/25/23 at [facility].  Summary: Right: Resting right ankle-brachial index is within normal range. The right toe-brachial index is abnormal. Left: The left toe-brachial index is abnormal. *See table(s) above for measurements and observations.  Electronically signed by Jimmye Moulds MD on 09/08/2023 at 11:00:03 AM.    Final    CUP PACEART REMOTE DEVICE CHECK Result Date: 09/07/2023 ILR summary report received. Battery status OK. Normal device function. No new symptom, tachy, brady, or pause episodes. No new AF episodes. 36 AT events, longest 14 hrs, V-rates 115-122 bpm. Increase in frequency of events.  Sending for awareness.  Monthly summary reports and ROV/PRN [place], [facility]

## 2023-09-24 ENCOUNTER — Inpatient Hospital Stay (HOSPITAL_BASED_OUTPATIENT_CLINIC_OR_DEPARTMENT_OTHER): Admitting: Hematology

## 2023-09-24 ENCOUNTER — Inpatient Hospital Stay

## 2023-09-24 DIAGNOSIS — C3492 Malignant neoplasm of unspecified part of left bronchus or lung: Secondary | ICD-10-CM

## 2023-09-24 DIAGNOSIS — Z794 Long term (current) use of insulin: Secondary | ICD-10-CM | POA: Diagnosis not present

## 2023-09-24 DIAGNOSIS — C3412 Malignant neoplasm of upper lobe, left bronchus or lung: Secondary | ICD-10-CM | POA: Diagnosis not present

## 2023-09-24 DIAGNOSIS — C3411 Malignant neoplasm of upper lobe, right bronchus or lung: Secondary | ICD-10-CM | POA: Diagnosis not present

## 2023-09-24 DIAGNOSIS — C3431 Malignant neoplasm of lower lobe, right bronchus or lung: Secondary | ICD-10-CM | POA: Diagnosis not present

## 2023-09-24 DIAGNOSIS — E1142 Type 2 diabetes mellitus with diabetic polyneuropathy: Secondary | ICD-10-CM | POA: Diagnosis not present

## 2023-09-24 DIAGNOSIS — I5032 Chronic diastolic (congestive) heart failure: Secondary | ICD-10-CM | POA: Diagnosis not present

## 2023-09-24 DIAGNOSIS — D539 Nutritional anemia, unspecified: Secondary | ICD-10-CM | POA: Diagnosis not present

## 2023-09-24 DIAGNOSIS — Z452 Encounter for adjustment and management of vascular access device: Secondary | ICD-10-CM | POA: Diagnosis not present

## 2023-09-24 DIAGNOSIS — I110 Hypertensive heart disease with heart failure: Secondary | ICD-10-CM | POA: Diagnosis not present

## 2023-09-24 DIAGNOSIS — Z79620 Long term (current) use of immunosuppressive biologic: Secondary | ICD-10-CM | POA: Diagnosis not present

## 2023-09-24 DIAGNOSIS — R059 Cough, unspecified: Secondary | ICD-10-CM | POA: Diagnosis not present

## 2023-09-24 DIAGNOSIS — Z5112 Encounter for antineoplastic immunotherapy: Secondary | ICD-10-CM | POA: Diagnosis not present

## 2023-09-24 DIAGNOSIS — R000 Tachycardia, unspecified: Secondary | ICD-10-CM | POA: Diagnosis not present

## 2023-09-24 DIAGNOSIS — Z5189 Encounter for other specified aftercare: Secondary | ICD-10-CM | POA: Diagnosis not present

## 2023-09-24 DIAGNOSIS — Z79899 Other long term (current) drug therapy: Secondary | ICD-10-CM | POA: Diagnosis not present

## 2023-09-24 DIAGNOSIS — E1165 Type 2 diabetes mellitus with hyperglycemia: Secondary | ICD-10-CM | POA: Diagnosis not present

## 2023-09-24 DIAGNOSIS — Z5111 Encounter for antineoplastic chemotherapy: Secondary | ICD-10-CM | POA: Diagnosis not present

## 2023-09-24 LAB — CBC WITH DIFFERENTIAL/PLATELET
Abs Immature Granulocytes: 0.49 10*3/uL — ABNORMAL HIGH (ref 0.00–0.07)
Basophils Absolute: 0.1 10*3/uL (ref 0.0–0.1)
Basophils Relative: 0 %
Eosinophils Absolute: 0.1 10*3/uL (ref 0.0–0.5)
Eosinophils Relative: 1 %
HCT: 27.1 % — ABNORMAL LOW (ref 39.0–52.0)
Hemoglobin: 8.7 g/dL — ABNORMAL LOW (ref 13.0–17.0)
Immature Granulocytes: 3 %
Lymphocytes Relative: 4 %
Lymphs Abs: 0.6 10*3/uL — ABNORMAL LOW (ref 0.7–4.0)
MCH: 33.1 pg (ref 26.0–34.0)
MCHC: 32.1 g/dL (ref 30.0–36.0)
MCV: 103 fL — ABNORMAL HIGH (ref 80.0–100.0)
Monocytes Absolute: 1.5 10*3/uL — ABNORMAL HIGH (ref 0.1–1.0)
Monocytes Relative: 10 %
Neutro Abs: 12.2 10*3/uL — ABNORMAL HIGH (ref 1.7–7.7)
Neutrophils Relative %: 82 %
Platelets: 332 10*3/uL (ref 150–400)
RBC: 2.63 MIL/uL — ABNORMAL LOW (ref 4.22–5.81)
RDW: 17.2 % — ABNORMAL HIGH (ref 11.5–15.5)
WBC: 15 10*3/uL — ABNORMAL HIGH (ref 4.0–10.5)
nRBC: 0 % (ref 0.0–0.2)

## 2023-09-24 LAB — COMPREHENSIVE METABOLIC PANEL WITH GFR
ALT: 13 U/L (ref 0–44)
AST: 13 U/L — ABNORMAL LOW (ref 15–41)
Albumin: 2.3 g/dL — ABNORMAL LOW (ref 3.5–5.0)
Alkaline Phosphatase: 150 U/L — ABNORMAL HIGH (ref 38–126)
Anion gap: 11 (ref 5–15)
BUN: 13 mg/dL (ref 8–23)
CO2: 25 mmol/L (ref 22–32)
Calcium: 8.1 mg/dL — ABNORMAL LOW (ref 8.9–10.3)
Chloride: 99 mmol/L (ref 98–111)
Creatinine, Ser: 0.9 mg/dL (ref 0.61–1.24)
GFR, Estimated: >60 mL/min (ref >60)
Glucose, Bld: 123 mg/dL — ABNORMAL HIGH (ref 70–99)
Potassium: 3.6 mmol/L (ref 3.5–5.1)
Sodium: 135 mmol/L (ref 135–145)
Total Bilirubin: 0.4 mg/dL (ref 0.0–1.2)
Total Protein: 6.5 g/dL (ref 6.5–8.1)

## 2023-09-24 LAB — MAGNESIUM: Magnesium: 1.6 mg/dL — ABNORMAL LOW (ref 1.7–2.4)

## 2023-09-24 MED ORDER — SODIUM CHLORIDE 0.9% FLUSH
10.0000 mL | INTRAVENOUS | Status: DC | PRN
  Administered 2023-09-24: 10 mL via INTRAVENOUS

## 2023-09-24 MED ORDER — MAGNESIUM SULFATE 2 GM/50ML IV SOLN
2.0000 g | Freq: Once | INTRAVENOUS | Status: AC
  Administered 2023-09-24: 2 g via INTRAVENOUS
  Filled 2023-09-24: qty 50

## 2023-09-24 MED ORDER — SODIUM CHLORIDE 0.9% FLUSH
10.0000 mL | INTRAVENOUS | Status: AC
  Administered 2023-09-24: 10 mL

## 2023-09-24 MED ORDER — PROCHLORPERAZINE MALEATE 5 MG PO TABS: 5.0000 mg | ORAL_TABLET | Freq: Four times a day (QID) | ORAL | 2 refills | Status: DC | PRN

## 2023-09-24 MED ORDER — SODIUM CHLORIDE 0.9 % IV SOLN: INTRAVENOUS | Status: DC

## 2023-09-24 MED ORDER — HEPARIN SOD (PORK) LOCK FLUSH 100 UNIT/ML IV SOLN
500.0000 [IU] | Freq: Once | INTRAVENOUS | Status: AC
  Administered 2023-09-24: 500 [IU] via INTRAVENOUS

## 2023-09-24 NOTE — Progress Notes (Signed)
 2 grams Magnesium  Sulfate given today per MD orders. Tolerated infusion without adverse affects. Vital signs stable. No complaints at this time. Discharged from clinic by wheel chair in stable condition. Alert and oriented x 3. F/U with [facility] as scheduled.

## 2023-09-24 NOTE — Patient Instructions (Signed)
 [facility] [place] [facility]  Discharge Instructions: Thank you for choosing [facility] to provide your oncology and hematology care.  If you have a lab appointment with the Cancer Center - please note that after April 8th, 2024, all labs will be drawn in the cancer center.  You do not have to check in or register with the main entrance as you have in the past but will complete your check-in in the cancer center.  Wear comfortable clothing and clothing appropriate for easy access to any Portacath or PICC line.   We strive to give you quality time with your provider. You may need to reschedule your appointment if you arrive late (15 or more minutes).  Arriving late affects you and other patients whose appointments are after yours.  Also, if you miss three or more appointments without notifying the office, you may be dismissed from the clinic at the provider's discretion.      For prescription refill requests, have your pharmacy contact our office and allow 72 hours for refills to be completed.    Today you received the following chemotherapy and/or immunotherapy agents Magnesium      To help prevent nausea and vomiting after your treatment, we encourage you to take your nausea medication as directed.  BELOW ARE SYMPTOMS THAT SHOULD BE REPORTED IMMEDIATELY: *FEVER GREATER THAN 100.4 F (38 C) OR HIGHER *CHILLS OR SWEATING *NAUSEA AND VOMITING THAT IS NOT CONTROLLED WITH YOUR NAUSEA MEDICATION *UNUSUAL SHORTNESS OF BREATH *UNUSUAL BRUISING OR BLEEDING *URINARY PROBLEMS (pain or burning when urinating, or frequent urination) *BOWEL PROBLEMS (unusual diarrhea, constipation, pain near the anus) TENDERNESS IN MOUTH AND THROAT WITH OR WITHOUT PRESENCE OF ULCERS (sore throat, sores in mouth, or a toothache) UNUSUAL RASH, SWELLING OR PAIN  UNUSUAL VAGINAL DISCHARGE OR ITCHING   Items with * indicate a potential emergency and should be followed up  as soon as possible or go to the Emergency Department if any problems should occur.  Please show the CHEMOTHERAPY ALERT CARD or IMMUNOTHERAPY ALERT CARD at check-in to the Emergency Department and triage nurse.  Should you have questions after your visit or need to cancel or reschedule your appointment, please contact [facility] [place] [facility] - A DEPT OF [facility] [phone]  and follow the prompts.  Office hours are 8:00 a.m. to 4:30 p.m. Monday - Friday. Please note that voicemails left after 4:00 p.m. may not be returned until the following business day.  We are closed weekends and major holidays. You have access to a nurse at all times for urgent questions. Please call the main number to the clinic [phone] and follow the prompts.  For any non-urgent questions, you may also contact your provider using MyChart. We now offer e-Visits for anyone 41 and older to request care online for non-urgent symptoms. For details visit [email or web address].   Also download the MyChart app! Go to the app store, search "MyChart", open the app, select [place], and log in with your MyChart username and password.

## 2023-09-24 NOTE — Patient Instructions (Addendum)
 [facility] at [facility] Discharge Instructions   You were seen and examined today by Dr. Cheree Cords.  He reviewed the results of your lab work which are mostly normal/stable.   We will proceed with your treatment in 2 weeks.   Return as scheduled.    Thank you for choosing [facility] at [facility] to provide your oncology and hematology care.  To afford each patient quality time with our provider, please arrive at least 15 minutes before your scheduled appointment time.   If you have a lab appointment with the Cancer Center please come in thru the Main Entrance and check in at the main information desk.  You need to re-schedule your appointment should you arrive 10 or more minutes late.  We strive to give you quality time with our providers, and arriving late affects you and other patients whose appointments are after yours.  Also, if you no show three or more times for appointments you may be dismissed from the clinic at the providers discretion.     Again, thank you for choosing [facility].  Our hope is that these requests will decrease the amount of time that you wait before being seen by our physicians.       _____________________________________________________________  Should you have questions after your visit to [facility], please contact our office at [phone] and follow the prompts.  Our office hours are 8:00 a.m. and 4:30 p.m. Monday - Friday.  Please note that voicemails left after 4:00 p.m. may not be returned until the following business day.  We are closed weekends and major holidays.  You do have access to a nurse 24-7, just call the main number to the clinic [phone] and do not press any options, hold on the line and a nurse will answer the phone.    For prescription refill requests, have your pharmacy contact our office and allow 72 hours.    Due to Covid, you will need to wear a mask  upon entering the hospital. If you do not have a mask, a mask will be given to you at the Main Entrance upon arrival. For doctor visits, patients may have 1 support person age 16 or older with them. For treatment visits, patients can not have anyone with them due to social distancing guidelines and our immunocompromised population.

## 2023-09-24 NOTE — Progress Notes (Signed)
 Patient presents today fro Possible fluids per providers order.  Vital signs and labs reviewed by MD.  Message received from Donzell Gallery RN/Dr. Cheree Cords patient to receive 2 grams IV Magnesium  Sulfate, no additional fluids needed.

## 2023-09-25 DIAGNOSIS — G4733 Obstructive sleep apnea (adult) (pediatric): Secondary | ICD-10-CM | POA: Diagnosis not present

## 2023-09-28 DIAGNOSIS — E1165 Type 2 diabetes mellitus with hyperglycemia: Secondary | ICD-10-CM | POA: Diagnosis not present

## 2023-09-29 ENCOUNTER — Inpatient Hospital Stay

## 2023-09-30 ENCOUNTER — Inpatient Hospital Stay

## 2023-09-30 ENCOUNTER — Inpatient Hospital Stay: Admitting: Hematology

## 2023-10-02 ENCOUNTER — Inpatient Hospital Stay

## 2023-10-02 DIAGNOSIS — I872 Venous insufficiency (chronic) (peripheral): Secondary | ICD-10-CM | POA: Diagnosis not present

## 2023-10-02 DIAGNOSIS — E039 Hypothyroidism, unspecified: Secondary | ICD-10-CM | POA: Diagnosis not present

## 2023-10-02 DIAGNOSIS — I4892 Unspecified atrial flutter: Secondary | ICD-10-CM | POA: Diagnosis not present

## 2023-10-02 DIAGNOSIS — E1151 Type 2 diabetes mellitus with diabetic peripheral angiopathy without gangrene: Secondary | ICD-10-CM | POA: Diagnosis not present

## 2023-10-02 DIAGNOSIS — E1142 Type 2 diabetes mellitus with diabetic polyneuropathy: Secondary | ICD-10-CM | POA: Diagnosis not present

## 2023-10-02 DIAGNOSIS — D630 Anemia in neoplastic disease: Secondary | ICD-10-CM | POA: Diagnosis not present

## 2023-10-02 DIAGNOSIS — C3411 Malignant neoplasm of upper lobe, right bronchus or lung: Secondary | ICD-10-CM | POA: Diagnosis not present

## 2023-10-02 DIAGNOSIS — I5033 Acute on chronic diastolic (congestive) heart failure: Secondary | ICD-10-CM | POA: Diagnosis not present

## 2023-10-02 DIAGNOSIS — K8020 Calculus of gallbladder without cholecystitis without obstruction: Secondary | ICD-10-CM | POA: Diagnosis not present

## 2023-10-02 DIAGNOSIS — I152 Hypertension secondary to endocrine disorders: Secondary | ICD-10-CM | POA: Diagnosis not present

## 2023-10-02 DIAGNOSIS — J4489 Other specified chronic obstructive pulmonary disease: Secondary | ICD-10-CM | POA: Diagnosis not present

## 2023-10-02 DIAGNOSIS — I700 Atherosclerosis of aorta: Secondary | ICD-10-CM | POA: Diagnosis not present

## 2023-10-02 DIAGNOSIS — J9611 Chronic respiratory failure with hypoxia: Secondary | ICD-10-CM | POA: Diagnosis not present

## 2023-10-02 DIAGNOSIS — E1169 Type 2 diabetes mellitus with other specified complication: Secondary | ICD-10-CM | POA: Diagnosis not present

## 2023-10-02 DIAGNOSIS — N138 Other obstructive and reflux uropathy: Secondary | ICD-10-CM | POA: Diagnosis not present

## 2023-10-02 DIAGNOSIS — I4891 Unspecified atrial fibrillation: Secondary | ICD-10-CM | POA: Diagnosis not present

## 2023-10-02 DIAGNOSIS — L97419 Non-pressure chronic ulcer of right heel and midfoot with unspecified severity: Secondary | ICD-10-CM | POA: Diagnosis not present

## 2023-10-02 DIAGNOSIS — F5101 Primary insomnia: Secondary | ICD-10-CM | POA: Diagnosis not present

## 2023-10-02 DIAGNOSIS — M109 Gout, unspecified: Secondary | ICD-10-CM | POA: Diagnosis not present

## 2023-10-02 DIAGNOSIS — R338 Other retention of urine: Secondary | ICD-10-CM | POA: Diagnosis not present

## 2023-10-02 DIAGNOSIS — I6521 Occlusion and stenosis of right carotid artery: Secondary | ICD-10-CM | POA: Diagnosis not present

## 2023-10-02 DIAGNOSIS — E1159 Type 2 diabetes mellitus with other circulatory complications: Secondary | ICD-10-CM | POA: Diagnosis not present

## 2023-10-02 DIAGNOSIS — E782 Mixed hyperlipidemia: Secondary | ICD-10-CM | POA: Diagnosis not present

## 2023-10-02 DIAGNOSIS — Z794 Long term (current) use of insulin: Secondary | ICD-10-CM | POA: Diagnosis not present

## 2023-10-06 ENCOUNTER — Inpatient Hospital Stay

## 2023-10-06 ENCOUNTER — Telehealth: Payer: Self-pay | Admitting: *Deleted

## 2023-10-06 ENCOUNTER — Inpatient Hospital Stay (HOSPITAL_BASED_OUTPATIENT_CLINIC_OR_DEPARTMENT_OTHER): Admitting: Hematology

## 2023-10-06 ENCOUNTER — Inpatient Hospital Stay: Attending: Hematology

## 2023-10-06 VITALS — BP 112/42 | HR 95 | Temp 98.1°F | Resp 18

## 2023-10-06 VITALS — BP 118/59 | HR 88 | Temp 96.2°F | Resp 20 | Wt 144.4 lb

## 2023-10-06 DIAGNOSIS — R918 Other nonspecific abnormal finding of lung field: Secondary | ICD-10-CM | POA: Diagnosis not present

## 2023-10-06 DIAGNOSIS — G629 Polyneuropathy, unspecified: Secondary | ICD-10-CM | POA: Insufficient documentation

## 2023-10-06 DIAGNOSIS — E039 Hypothyroidism, unspecified: Secondary | ICD-10-CM | POA: Diagnosis not present

## 2023-10-06 DIAGNOSIS — C3431 Malignant neoplasm of lower lobe, right bronchus or lung: Secondary | ICD-10-CM | POA: Diagnosis not present

## 2023-10-06 DIAGNOSIS — Z7984 Long term (current) use of oral hypoglycemic drugs: Secondary | ICD-10-CM | POA: Diagnosis not present

## 2023-10-06 DIAGNOSIS — E860 Dehydration: Secondary | ICD-10-CM | POA: Diagnosis not present

## 2023-10-06 DIAGNOSIS — J9611 Chronic respiratory failure with hypoxia: Secondary | ICD-10-CM | POA: Diagnosis not present

## 2023-10-06 DIAGNOSIS — I480 Paroxysmal atrial fibrillation: Secondary | ICD-10-CM | POA: Diagnosis not present

## 2023-10-06 DIAGNOSIS — Z5112 Encounter for antineoplastic immunotherapy: Secondary | ICD-10-CM | POA: Diagnosis not present

## 2023-10-06 DIAGNOSIS — Z452 Encounter for adjustment and management of vascular access device: Secondary | ICD-10-CM | POA: Diagnosis not present

## 2023-10-06 DIAGNOSIS — A419 Sepsis, unspecified organism: Secondary | ICD-10-CM | POA: Diagnosis not present

## 2023-10-06 DIAGNOSIS — C3491 Malignant neoplasm of unspecified part of right bronchus or lung: Secondary | ICD-10-CM | POA: Diagnosis not present

## 2023-10-06 DIAGNOSIS — R059 Cough, unspecified: Secondary | ICD-10-CM | POA: Diagnosis not present

## 2023-10-06 DIAGNOSIS — I342 Nonrheumatic mitral (valve) stenosis: Secondary | ICD-10-CM | POA: Diagnosis not present

## 2023-10-06 DIAGNOSIS — R000 Tachycardia, unspecified: Secondary | ICD-10-CM | POA: Insufficient documentation

## 2023-10-06 DIAGNOSIS — I3489 Other nonrheumatic mitral valve disorders: Secondary | ICD-10-CM | POA: Diagnosis not present

## 2023-10-06 DIAGNOSIS — I517 Cardiomegaly: Secondary | ICD-10-CM | POA: Diagnosis not present

## 2023-10-06 DIAGNOSIS — Z792 Long term (current) use of antibiotics: Secondary | ICD-10-CM | POA: Diagnosis not present

## 2023-10-06 DIAGNOSIS — Z7982 Long term (current) use of aspirin: Secondary | ICD-10-CM | POA: Diagnosis not present

## 2023-10-06 DIAGNOSIS — R079 Chest pain, unspecified: Secondary | ICD-10-CM | POA: Diagnosis not present

## 2023-10-06 DIAGNOSIS — F17210 Nicotine dependence, cigarettes, uncomplicated: Secondary | ICD-10-CM | POA: Diagnosis not present

## 2023-10-06 DIAGNOSIS — R0902 Hypoxemia: Secondary | ICD-10-CM | POA: Diagnosis not present

## 2023-10-06 DIAGNOSIS — Z7902 Long term (current) use of antithrombotics/antiplatelets: Secondary | ICD-10-CM | POA: Diagnosis not present

## 2023-10-06 DIAGNOSIS — Z882 Allergy status to sulfonamides status: Secondary | ICD-10-CM | POA: Diagnosis not present

## 2023-10-06 DIAGNOSIS — N179 Acute kidney failure, unspecified: Secondary | ICD-10-CM | POA: Diagnosis not present

## 2023-10-06 DIAGNOSIS — E785 Hyperlipidemia, unspecified: Secondary | ICD-10-CM | POA: Diagnosis not present

## 2023-10-06 DIAGNOSIS — I10 Essential (primary) hypertension: Secondary | ICD-10-CM | POA: Diagnosis not present

## 2023-10-06 DIAGNOSIS — R509 Fever, unspecified: Secondary | ICD-10-CM | POA: Diagnosis not present

## 2023-10-06 DIAGNOSIS — I5032 Chronic diastolic (congestive) heart failure: Secondary | ICD-10-CM | POA: Insufficient documentation

## 2023-10-06 DIAGNOSIS — C3492 Malignant neoplasm of unspecified part of left bronchus or lung: Secondary | ICD-10-CM

## 2023-10-06 DIAGNOSIS — I110 Hypertensive heart disease with heart failure: Secondary | ICD-10-CM | POA: Insufficient documentation

## 2023-10-06 DIAGNOSIS — R062 Wheezing: Secondary | ICD-10-CM | POA: Diagnosis not present

## 2023-10-06 DIAGNOSIS — I509 Heart failure, unspecified: Secondary | ICD-10-CM | POA: Diagnosis not present

## 2023-10-06 DIAGNOSIS — I959 Hypotension, unspecified: Secondary | ICD-10-CM | POA: Diagnosis not present

## 2023-10-06 DIAGNOSIS — D539 Nutritional anemia, unspecified: Secondary | ICD-10-CM | POA: Diagnosis not present

## 2023-10-06 DIAGNOSIS — Z1152 Encounter for screening for COVID-19: Secondary | ICD-10-CM | POA: Diagnosis not present

## 2023-10-06 DIAGNOSIS — Z7951 Long term (current) use of inhaled steroids: Secondary | ICD-10-CM | POA: Diagnosis not present

## 2023-10-06 DIAGNOSIS — I4891 Unspecified atrial fibrillation: Secondary | ICD-10-CM | POA: Diagnosis not present

## 2023-10-06 DIAGNOSIS — Z79891 Long term (current) use of opiate analgesic: Secondary | ICD-10-CM | POA: Diagnosis not present

## 2023-10-06 DIAGNOSIS — C3412 Malignant neoplasm of upper lobe, left bronchus or lung: Secondary | ICD-10-CM | POA: Insufficient documentation

## 2023-10-06 DIAGNOSIS — J440 Chronic obstructive pulmonary disease with acute lower respiratory infection: Secondary | ICD-10-CM | POA: Diagnosis not present

## 2023-10-06 DIAGNOSIS — J449 Chronic obstructive pulmonary disease, unspecified: Secondary | ICD-10-CM | POA: Diagnosis not present

## 2023-10-06 DIAGNOSIS — C3411 Malignant neoplasm of upper lobe, right bronchus or lung: Secondary | ICD-10-CM | POA: Diagnosis not present

## 2023-10-06 DIAGNOSIS — Z79899 Other long term (current) drug therapy: Secondary | ICD-10-CM | POA: Diagnosis not present

## 2023-10-06 DIAGNOSIS — I3481 Nonrheumatic mitral (valve) annulus calcification: Secondary | ICD-10-CM | POA: Diagnosis not present

## 2023-10-06 DIAGNOSIS — E119 Type 2 diabetes mellitus without complications: Secondary | ICD-10-CM | POA: Diagnosis not present

## 2023-10-06 DIAGNOSIS — E1151 Type 2 diabetes mellitus with diabetic peripheral angiopathy without gangrene: Secondary | ICD-10-CM | POA: Diagnosis not present

## 2023-10-06 DIAGNOSIS — G4733 Obstructive sleep apnea (adult) (pediatric): Secondary | ICD-10-CM | POA: Diagnosis not present

## 2023-10-06 LAB — COMPREHENSIVE METABOLIC PANEL WITH GFR
ALT: 13 U/L (ref 0–44)
AST: 13 U/L — ABNORMAL LOW (ref 15–41)
Albumin: 2.4 g/dL — ABNORMAL LOW (ref 3.5–5.0)
Alkaline Phosphatase: 102 U/L (ref 38–126)
Anion gap: 10 (ref 5–15)
BUN: 28 mg/dL — ABNORMAL HIGH (ref 8–23)
CO2: 28 mmol/L (ref 22–32)
Calcium: 9 mg/dL (ref 8.9–10.3)
Chloride: 97 mmol/L — ABNORMAL LOW (ref 98–111)
Creatinine, Ser: 1.44 mg/dL — ABNORMAL HIGH (ref 0.61–1.24)
GFR, Estimated: 50 mL/min — ABNORMAL LOW (ref >60)
Glucose, Bld: 146 mg/dL — ABNORMAL HIGH (ref 70–99)
Potassium: 4.5 mmol/L (ref 3.5–5.1)
Sodium: 135 mmol/L (ref 135–145)
Total Bilirubin: 0.2 mg/dL (ref 0.0–1.2)
Total Protein: 7.7 g/dL (ref 6.5–8.1)

## 2023-10-06 LAB — CBC WITH DIFFERENTIAL/PLATELET
Abs Immature Granulocytes: 0.48 10*3/uL — ABNORMAL HIGH (ref 0.00–0.07)
Basophils Absolute: 0.2 10*3/uL — ABNORMAL HIGH (ref 0.0–0.1)
Basophils Relative: 1 %
Eosinophils Absolute: 0.2 10*3/uL (ref 0.0–0.5)
Eosinophils Relative: 1 %
HCT: 32.2 % — ABNORMAL LOW (ref 39.0–52.0)
Hemoglobin: 9.6 g/dL — ABNORMAL LOW (ref 13.0–17.0)
Immature Granulocytes: 2 %
Lymphocytes Relative: 6 %
Lymphs Abs: 1.3 10*3/uL (ref 0.7–4.0)
MCH: 31.9 pg (ref 26.0–34.0)
MCHC: 29.8 g/dL — ABNORMAL LOW (ref 30.0–36.0)
MCV: 107 fL — ABNORMAL HIGH (ref 80.0–100.0)
Monocytes Absolute: 2 10*3/uL — ABNORMAL HIGH (ref 0.1–1.0)
Monocytes Relative: 9 %
Neutro Abs: 17.2 10*3/uL — ABNORMAL HIGH (ref 1.7–7.7)
Neutrophils Relative %: 81 %
Platelets: 611 10*3/uL — ABNORMAL HIGH (ref 150–400)
RBC: 3.01 MIL/uL — ABNORMAL LOW (ref 4.22–5.81)
RDW: 18.9 % — ABNORMAL HIGH (ref 11.5–15.5)
WBC: 21.3 10*3/uL — ABNORMAL HIGH (ref 4.0–10.5)
nRBC: 0.1 % (ref 0.0–0.2)

## 2023-10-06 LAB — MAGNESIUM: Magnesium: 2.2 mg/dL (ref 1.7–2.4)

## 2023-10-06 MED ORDER — SODIUM CHLORIDE 0.9 % IV SOLN
360.0000 mg | Freq: Once | INTRAVENOUS | Status: AC
  Administered 2023-10-06: 360 mg via INTRAVENOUS
  Filled 2023-10-06: qty 24

## 2023-10-06 MED ORDER — ALTEPLASE 2 MG IJ SOLR
2.0000 mg | Freq: Once | INTRAMUSCULAR | Status: AC
  Administered 2023-10-06: 2 mg
  Filled 2023-10-06: qty 2

## 2023-10-06 MED ORDER — SODIUM CHLORIDE 0.9 % IV SOLN: Freq: Once | INTRAVENOUS | Status: AC

## 2023-10-06 MED ORDER — SODIUM CHLORIDE 0.9% FLUSH
10.0000 mL | INTRAVENOUS | Status: DC | PRN
  Administered 2023-10-06: 10 mL

## 2023-10-06 MED ORDER — HEPARIN SOD (PORK) LOCK FLUSH 100 UNIT/ML IV SOLN
500.0000 [IU] | Freq: Once | INTRAVENOUS | Status: AC | PRN
  Administered 2023-10-06: 500 [IU]

## 2023-10-06 MED ORDER — SODIUM CHLORIDE 0.9 % IV SOLN: INTRAVENOUS | Status: DC

## 2023-10-06 NOTE — Progress Notes (Signed)
 [facility] [address], [place] [address]    Clinic Day:  10/06/2023  Referring physician: Lavell Bari LABOR, FNP  Patient Care Team: Lavell Bari LABOR, FNP as PCP - General (Family Medicine) O'Neal, Darryle Ned, MD as PCP - Cardiology (Cardiology) Cindie Ole DASEN, MD as PCP - Electrophysiology (Cardiology) Rogers Hai, MD as Consulting Physician (Hematology) Billee Mliss BIRCH, [facility] (Pharmacist) O'Neal, Darryle Ned, MD as Consulting Physician (Cardiology) McKenzie, Belvie CROME, MD as Consulting Physician (Urology) Vicci Mcardle, OD (Optometry) Therisa Benton PARAS, NP as Nurse Practitioner (Endocrinology) Roddie Bring, DPM as Consulting Physician (Podiatry) Gretta Lonni PARAS, MD as Consulting Physician (Vascular Surgery)   ASSESSMENT & PLAN:   Assessment: 1.  Stage IIIa (T1b N2 cM0) NSCLC, favoring adenocarcinoma: -Chemo XRT with weekly carboplatin  and paclitaxel  completed on 08/20/2016. -Consolidation durvalumab  from 09/26/2016 through 09/25/2017. -Reviewed CT chest on 11/07/2019 which showed increasing size of the upper lobe pulmonary nodules with no new nodule in the left upper lobe suspicious for meta stasis.  Also increasing confluent soft tissue in the right suprahilar region, thought to be postradiation changes. -PET scan on 11/21/2019 showed suspected radiation changes in the right paramediastinal/perihilar region although mild hypermetabolism is present in the right retroareolar region.SUV 2.5.  While progressive soft tissue is present in this region, this hypermetabolism remains nonfocal and favors radiation changes.  7 mm central left upper lobe nodule max SUV 1.4.  9 mm aggregate nodule in the lateral right upper lobe max SUV 2.4.  These are minimally progressive from more remote prior scans.  6 mm left lower lobe nodule unchanged.  No hypermetabolic thoracic adenopathy. -CT chest on 03/23/2020 showed many of the lung nodules are stable.  Couple  of nodules have grown by few millimeters.  No new areas seen. - SBRT to the right and left lung lesions, 54 Gray in 3 fractions from 09/10/2020 through 09/17/2020. - SBRT to the RUL and LUL lesions from 06/19/2022 through 06/26/2022 by Dr. Shannon. - PET scan (07/09/2023): LUL mass 3.5 x 5.1 cm with SUV 10.8.  Additional hypermetabolic nodules in the right lung both within the post radiation consolidation in the right upper lobe SUV 10.2 and within the medial right lower lobe SUV 8.4 measuring 2 x 2.4 cm.  No adenopathy. - Bronchoscopy and biopsy on 08/03/2023. - Pathology: RLL FNA squamous cell carcinoma (p40 positive and negative for TTF-1).  LUL FNA: Favor squamous cell carcinoma (positive for p40 and show patchy staining for TTF-1).  RUL endobronchial brushing shows atypical cells. - [id number] (07/25/2023): T p53 mutation.  MSI-high not detected.  TMB not evaluable. - [facility] NGS: MS-stable, TMB-high (13 Mut/Mb), PD-L1 (22 C3) TPS 10%.  Met IHC protein 3+, 90% (Telisotuzumab Vedotin for non-squamous histology), HER2 IHC (1+). - Checkmate 9 LA (carboplatin , paclitaxel , nivolumab  every 3 weeks and ipilimumab  every 6 weeks) started on 09/09/2023 - He has PD-L1 TPS of 10% with no other targetable mutations.  He has MET IHC protein 3+90%, but the treatment is not offered for squamous histology.  GPS AI showed high probability of adenocarcinoma.   2.  Normocytic to macrocytic anemia: - Found to have hemoglobin 6.3 on 10/09/2020 and received 2 units PRBC while hospitalized.  I have reviewed hospitalization records. - EGD on 09/06/2020 with tiny gastric ulcer without bleeding or stigmata. - Blood loss this year most likely postoperative in nature (urological surgery in February and vascular surgery in May).    Plan: 1.  Stage IV squamous cell lung  cancer: - Cycle 1 of chemoimmunotherapy on 09/09/2023. - He had occasional nausea and felt very weak after his first cycle.  He fell couple of times over the weekend when he  was trying to get out of the bed.  He was not using walker at that time.  He had minor cuts on the legs. - I have reviewed labs from today: Creatinine 1.44, LFTs are normal.  Albumin is slightly better at 2.4.  White count elevated from G-CSF.  Hemoglobin is 9.6.  Last TSH is 2.2. - As he is still weak, I would discontinue cycle 2 of chemotherapy.  He may proceed with nivolumab  360 mg today.  He will also receive 100 mL of normal saline today.  RTC 3 weeks for follow-up for nivolumab  and ipilimumab . - We reviewed his NGS results which showed met IHC 3+ positive.  However Telisotuzumab is approved as second line only in adenocarcinoma histology.   2.  Normocytic to macrocytic anemia: - Last Monoferric  on 04/20/2023.  Hemoglobin today is 9.6 from myelosuppression.   3.  Decreased appetite/weight loss: - He is not taking Megace  regularly.  He reports that he is eating well.  He lost about 6 pounds in the last 4 weeks.  He was told to take Megace  twice daily and drink boost/Ensure supplements daily.  4.  Peripheral neuropathy: - Neuropathy in the hands and feet has been stable from diabetes.  Continue gabapentin  3 times daily.  5.  Chronic diastolic CHF/sinus tachycardia: -Continue Cardizem  CD 120 mg daily and torsemide  20 mg 1 tablet daily.  He is not taking spironolactone  or metolazone .  Leg swellings have improved.    No orders of the defined types were placed in this encounter.     LILLETTE Verneta SAUNDERS Teague,acting as a Neurosurgeon for Alean Stands, MD.,have documented all relevant documentation on the behalf of Alean Stands, MD,as directed by  Alean Stands, MD while in the presence of Alean Stands, MD.  I, Alean Stands MD, have reviewed the above documentation for accuracy and completeness, and I agree with the above.      Alean Stands, MD   7/1/202510:24 AM  CHIEF COMPLAINT:   Diagnosis: right NSCLC, favor adenocarcinoma    Cancer Staging  Non-small  cell lung cancer, right [facility]) Staging form: Lung, AJCC 8th Edition - Clinical stage from 06/05/2016: Stage IIIA (cT1b(2), cN2, cM0) - Signed by Berry Debby RAMAN, PA-C on 08/21/2016  Primary squamous cell carcinoma of left lung (HCC) Staging form: Lung, AJCC V9 - Clinical stage from 08/17/2023: Stage IVA (rcT2b, rcN0, rcM1a) - Unsigned    Prior Therapy: 1. Chemoradiation with weekly carboplatin  and paclitaxel , 06/25/16 - 08/20/16. 2. Consolidation durvalumab , 09/26/16 - 09/25/17. 3. SBRT to RUL, LUL nodules 09/10/20 - 09/17/20 4. SBRT to RUL, LUL nodules 06/19/22 - 06/26/22  Current Therapy: Nivolumab  and ipilimumab    HISTORY OF PRESENT ILLNESS:   Oncology History  Non-small cell lung cancer, right (HCC)  04/17/2016 Imaging   CT chest- 1. Examination is positive for bilateral upper lobe pulmonary lesions suspicious for malignancy. Further investigation with PET-CT and tissue sampling is advised. 2. Borderline enlarged paratracheal lymph nodes and right hilar nodes. Cannot rule out metastatic adenopathy. Attention to these areas on PET-CT is advised. 3. Emphysema 4. Aortic atherosclerosis and multi vessel coronary artery calcification.   04/18/2016 Initial Diagnosis   Adenocarcinoma of lung, right (HCC)   04/24/2016 PET scan   1. Adjacent hypermetabolic nodules in the RIGHT upper lobe consistent bronchogenic carcinoma. 2. Suspicion of  ipsilateral hilar and paratracheal nodal metastasis. 3. Nodule in the LEFT upper lobe with suspicious morphology has a relatively low metabolic activity for size. Favor synchronous primary bronchogenic carcinoma. 4. No metabolic activity associated with a small LEFT lower lobe pulmonary nodule. Recommend attention on follow-up.   05/06/2016 Imaging   MRI brain- Negative for metastatic disease.   05/28/2016 Procedure   1. Electromagnetic navigational bronchoscopy with brushings, needle     aspirations and biopsies of bilateral upper lobe nodules. 2.  Endobronchial ultrasound with mediastinal and hilar lymph node     aspirations. By Dr. Kerrin   06/03/2016 Pathology Results   Diagnosis 1. Lung, biopsy, Right upper lobe - BLOOD AND BENIGN BRONCHIAL AND LUNG TISSUE. 2. Lung, biopsy, Left upper lobe - BLOOD AND BENIGN BRONCHIAL AND LUNG TISSUE.   06/03/2016 Pathology Results   Diagnosis TRANSBRONCHIAL NEEDLE ASPIRATION, NAVIGATION, RUL (SPECIMEN 5 OF 7, COLLECTED 05/28/16): MALIGNANT CELLS CONSISTENT WITH NON-SMALL CELL CARCINOMA Comment There are limited tumor cells in the cell block (insufficient for molecular testing). TTF-1 is positive in a few of the atypical cells. They appear negative for synaptophysin and cytokeratin 5/6. Overall there is limited tumor, but a lung adenocarcinoma is slightly favored. Dr. Belvie has reviewed the case. The case was called to Dr. Kerrin on 06/03/2016.   06/13/2016 Procedure   Port placed by Dr. Mavis   06/25/2016 - 08/15/2016 Chemotherapy   The patient had palonosetron  (ALOXI ) injection 0.25 mg, 0.25 mg, Intravenous,  Once, 6 of 6 cycles  CARBOplatin  (PARAPLATIN ) 220 mg in sodium chloride  0.9 % 250 mL chemo infusion, 220 mg (100 % of original dose 215.8 mg), Intravenous,  Once, 6 of 6 cycles Dose modification:   (original dose 215.8 mg, Cycle 1),   (original dose 215.8 mg, Cycle 5),   (original dose 215.8 mg, Cycle 6),   (original dose 215.8 mg, Cycle 2),   (original dose 215.8 mg, Cycle 3),   (original dose 215.8 mg, Cycle 4)  PACLitaxel  (TAXOL ) 90 mg in dextrose  5 % 250 mL chemo infusion ( for chemotherapy treatment.     06/25/2016 - 08/20/2016 Radiation Therapy   Radiation in [place], [place] by Dr. Vallerie.  R lung to 6600 cGy   07/09/2016 - 07/10/2016 Hospital Admission   Admit date: 07/09/2016  Admission diagnosis: Fever and chills Additional comments: Negative work-up, discharged with course of Levaquin    07/09/2016 Treatment Plan Change   Treatment deferred x 1 week due to hospitalization     08/05/2016 - 08/06/2016 Hospital Admission   Admit date: 08/05/2016 Admission diagnosis: Joint pain Additional comments: Treated with steroids with symptomatic improvement   08/25/2016 Imaging   MRI brain- Negative for intracranial metastasis on this motion degraded examination.  Stable mild-to-moderate chronic small vessel ischemic disease.   09/18/2016 PET scan   1. Partial metabolic response. Right upper lobe 2.6 cm hypermetabolic pulmonary nodule, decreased in size and metabolism. Mildly hypermetabolic right hilar nodal metastasis, decreased in metabolism. Right paratracheal lymph node is no longer hypermetabolic. No new or progressive metastatic disease. 2. Apical left upper lobe 0.9 cm solid pulmonary nodule, slightly decreased in size, with no significant metabolism. The change in size could indicate a neoplastic etiology for this nodule. 3. Additional subcentimeter pulmonary nodules in the left lung are stable and below PET resolution . 4. Additional findings include aortic atherosclerosis, coronary atherosclerosis, moderate emphysema, stable small pericardial effusion/thickening and mild sigmoid diverticulosis.   01/13/2017 Imaging   CT chest with contrast IMPRESSION: 1. No change in size  of the hypermetabolic nodular thickening in the RIGHT upper lobe. 2. LEFT upper lobe spiculated nodule is stable. 3. Rounded LEFT lower lobe pulmonary nodule is stable. 4. No evidence of lung cancer progression.   05/08/2017 Adverse Reaction   Development of Hypothyroidism- likely secondary to immunotherapy.  Levothyroxine  therapy initiated.    Primary squamous cell carcinoma of left lung (HCC)  08/17/2023 Initial Diagnosis   Primary squamous cell carcinoma of left lung (HCC)   09/09/2023 -  Chemotherapy   Patient is on Treatment Plan : LUNG NSCLC SQUAMOUS Nivolumab  + Ipilimumab  + Carboplatin  + Paclitaxel  q42d X 1 cycle / Nivolumab  + Ipilimumab  q42d        INTERVAL HISTORY:   Dujuan  is a 78 y.o. male presenting to clinic today for follow up of right NSCLC, favor adenocarcinoma. He was last seen by me on 09/24/23.  Today, he states that he is doing well overall. His appetite level is at 75%. His energy level is at 25%. Violet is accompanied by his wife.  He reports 2 recent falls in the night over this weekend. His first fall occurred by slipping off the bed. His second fall caused buttock pain and headaches. Hendrick and his wife are unaware of what caused the fall, as he has no memory of his second fall. His wife notes he was disorientated and didn't make much sense for a few hours after the second fall. He also notes scratches on the left lower leg. His wife denies him losing consciousness after the falls and notes the room he fell in was dark. Heitor denies any vision changes.   The last time he received a white cell booster injection, he experienced pain and is unable to tolerate it.   Zackry notes right arm pain and notes a knot on the upper arm. Pain has improve to a mild soreness.   His wife reports Jahki is drowsy often. She also notes his legs occasionally shake, which she believes amy be due to chemotherapy and may have caused his falls.   He is taking 1 pill of lasix  daily and fluid retention in the legs has improved.   PAST MEDICAL HISTORY:   Past 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   Anxiety    Arthritis    Asthma    Atrial flutter (HCC)    BPH (benign prostatic hyperplasia)    with urinary retention 02/06/20   CHF (congestive heart failure) (HCC)    COPD (chronic obstructive pulmonary disease) (HCC)    Depression    Diabetes mellitus, type II (HCC)    Dyspnea    GERD (gastroesophageal reflux disease)    History of blood transfusion    History of kidney stones    History of radiation therapy    right lung 09/10/2020-09/17/2020   Dr Shannon   History of radiation therapy    Left and  right Lung- 06/19/22-06/26/22   Hyperlipidemia    Hypertension    Hypothyroidism    IDA (iron  deficiency anemia)    Macular degeneration    Neuropathy    hands and feet   Non-small cell lung cancer, right (HCC) 04/18/2016   Oxygen  dependent    uses 2L oxygen  as needed   PAD (peripheral artery disease) (HCC) 03/2023   managed by Dr Lonni Gaskins   Peripheral vascular disease [facility])    Prostatitis    Pulmonary nodule, left 07/16/2016   Sleep apnea 2019  uses CPAP nightly    Surgical History: Past Surgical History:  Procedure Laterality Date   A-FLUTTER ABLATION N/A 01/13/2022   Procedure: A-FLUTTER ABLATION;  Surgeon: Cindie Ole DASEN, MD;  Location: [facility] INVASIVE CV LAB;  Service: Cardiovascular;  Laterality: N/A;   ABDOMINAL AORTOGRAM W/LOWER EXTREMITY Left 02/06/2020   Procedure: ABDOMINAL AORTOGRAM W/LOWER EXTREMITY;  Surgeon: Court Dorn PARAS, MD;  Location: [facility] INVASIVE CV LAB;  Service: Cardiovascular;  Laterality: Left;   ABDOMINAL AORTOGRAM W/LOWER EXTREMITY N/A 05/30/2021   Procedure: ABDOMINAL AORTOGRAM W/LOWER EXTREMITY;  Surgeon: Gretta Lonni PARAS, MD;  Location: [facility] INVASIVE CV LAB;  Service: Cardiovascular;  Laterality: N/A;   ABDOMINAL AORTOGRAM W/LOWER EXTREMITY N/A 02/05/2023   Procedure: ABDOMINAL AORTOGRAM W/LOWER EXTREMITY;  Surgeon: Gretta Lonni PARAS, MD;  Location: [facility] INVASIVE CV LAB;  Service: Cardiovascular;  Laterality: N/A;   BIOPSY  09/16/2021   Procedure: BIOPSY;  Surgeon: Avram Lupita BRAVO, MD;  Location: [facility] ENDOSCOPY;  Service: Gastroenterology;;   BRONCHIAL BIOPSY  08/03/2023   Procedure: BRONCHOSCOPY, WITH BIOPSY;  Surgeon: Shelah Lamar RAMAN, MD;  Location: [facility] ENDOSCOPY;  Service: Pulmonary;;   BRONCHIAL BRUSHINGS  08/03/2023   Procedure: BRONCHOSCOPY, WITH BRUSH BIOPSY;  Surgeon: Shelah Lamar RAMAN, MD;  Location: [facility] ENDOSCOPY;  Service: Pulmonary;;   BRONCHIAL NEEDLE ASPIRATION BIOPSY  08/03/2023   Procedure: BRONCHOSCOPY, WITH NEEDLE ASPIRATION  BIOPSY;  Surgeon: Shelah Lamar RAMAN, MD;  Location: [facility] ENDOSCOPY;  Service: Pulmonary;;   CARDIOVERSION N/A 10/05/2020   Procedure: CARDIOVERSION;  Surgeon: Barbaraann Darryle Ned, MD;  Location: [facility] ENDOSCOPY;  Service: Cardiovascular;  Laterality: N/A;   CATARACT EXTRACTION, BILATERAL Bilateral    COLONOSCOPY WITH PROPOFOL  N/A 06/20/2021   Procedure: COLONOSCOPY WITH PROPOFOL ;  Surgeon: Abran Norleen SAILOR, MD;  Location: [facility] ENDOSCOPY;  Service: Endoscopy;  Laterality: N/A;   COLONOSCOPY WITH PROPOFOL  N/A 09/16/2021   Procedure: COLONOSCOPY WITH PROPOFOL ;  Surgeon: Avram Lupita BRAVO, MD;  Location: [facility] ENDOSCOPY;  Service: Gastroenterology;  Laterality: N/A;   ENDARTERECTOMY FEMORAL Right 08/20/2020   Procedure: ENDARTERECTOMY  RIGHT FEMORAL ARTERY;  Surgeon: Gretta Lonni PARAS, MD;  Location: [facility] OR;  Service: Vascular;  Laterality: Right;   ENTEROSCOPY N/A 10/11/2021   Procedure: ENTEROSCOPY;  Surgeon: Eartha Angelia Sieving, MD;  Location: [facility] ENDO SUITE;  Service: Gastroenterology;  Laterality: N/A;   ENTEROSCOPY N/A 02/18/2023   Procedure: ENTEROSCOPY;  Surgeon: Shaaron Lamar HERO, MD;  Location: [facility] ENDO SUITE;  Service: Endoscopy;  Laterality: N/A;  EGD with possible small bowel push enteroscopy   ESOPHAGOGASTRODUODENOSCOPY N/A 09/16/2021   Procedure: ESOPHAGOGASTRODUODENOSCOPY (EGD);  Surgeon: Avram Lupita BRAVO, MD;  Location: [facility] ENDOSCOPY;  Service: Gastroenterology;  Laterality: N/A;   ESOPHAGOGASTRODUODENOSCOPY (EGD) WITH PROPOFOL  N/A 09/06/2020   Procedure: ESOPHAGOGASTRODUODENOSCOPY (EGD) WITH PROPOFOL ;  Surgeon: Abran Norleen SAILOR, MD;  Location: [facility] ENDOSCOPY;  Service: Endoscopy;  Laterality: N/A;   ESOPHAGOGASTRODUODENOSCOPY (EGD) WITH PROPOFOL  N/A 10/11/2021   Procedure: ESOPHAGOGASTRODUODENOSCOPY (EGD) WITH PROPOFOL ;  Surgeon: Eartha Angelia Sieving, MD;  Location: [facility] ENDO SUITE;  Service: Gastroenterology;  Laterality: N/A;   ESOPHAGOGASTRODUODENOSCOPY (EGD) WITH PROPOFOL  N/A 02/18/2023    Procedure: ESOPHAGOGASTRODUODENOSCOPY (EGD) WITH PROPOFOL ;  Surgeon: Shaaron Lamar HERO, MD;  Location: [facility] ENDO SUITE;  Service: Endoscopy;  Laterality: N/A;   GIVENS CAPSULE STUDY N/A 02/19/2023   Procedure: GIVENS CAPSULE STUDY;  Surgeon: Cinderella Deatrice FALCON, MD;  Location: [facility] ENDO SUITE;  Service: Endoscopy;  Laterality: N/A;   HEMOSTASIS CLIP PLACEMENT  09/16/2021   Procedure: HEMOSTASIS CLIP PLACEMENT;  Surgeon: Avram Lupita BRAVO, MD;  Location: [facility]  ENDOSCOPY;  Service: Gastroenterology;;   HOT HEMOSTASIS N/A 09/16/2021   Procedure: HOT HEMOSTASIS (ARGON PLASMA COAGULATION/BICAP);  Surgeon: Avram Lupita BRAVO, MD;  Location: [facility] ENDOSCOPY;  Service: Gastroenterology;  Laterality: N/A;   HOT HEMOSTASIS  10/11/2021   Procedure: HOT HEMOSTASIS (ARGON PLASMA COAGULATION/BICAP);  Surgeon: Eartha Flavors, Toribio, MD;  Location[facility] ENDO [facility];  Service: Gastroenterology;;   HOT HEMOSTASIS  02/18/2023   Procedure: HOT HEMOSTASIS (ARGON PLASMA COAGULATION/BICAP);  Surgeon: Shaaron Lamar HERO, MD;  Location: [facility] ENDO SUITE;  Service: Endoscopy;;   INSERTION OF ILIAC STENT Right 08/20/2020   Procedure: RETROGRADE INSERTION OF RIGHT ILIAC STENT;  Surgeon: Gretta Lonni PARAS, MD;  Location: [facility] OR;  Service: Vascular;  Laterality: Right;   INTRAOPERATIVE ARTERIOGRAM Right 08/20/2020   Procedure: INTRA OPERATIVE ARTERIOGRAM ILIAC;  Surgeon: Gretta Lonni PARAS, MD;  Location[facility] OR;  Service: Vascular;  Laterality: Right;   PATCH ANGIOPLASTY Right 08/20/2020   Procedure: PATCH ANGIOPLASTY RIGHT FEMORAL ARTERY;  Surgeon: Gretta Lonni PARAS, MD;  Location: [facility] OR;  Service: Vascular;  Laterality: Right;   PERIPHERAL VASCULAR BALLOON ANGIOPLASTY Right 05/30/2021   Procedure: PERIPHERAL VASCULAR BALLOON ANGIOPLASTY;  Surgeon: Gretta Lonni PARAS, MD;  [facility]: [facility] INVASIVE CV LAB;  Service: Cardiovascular;  Laterality: Right;   PERIPHERAL VASCULAR INTERVENTION Right 02/05/2023   Procedure: PERIPHERAL VASCULAR  INTERVENTION;  Surgeon: Gretta Lonni PARAS, MD;  Location: [facility] INVASIVE CV LAB;  Service: Cardiovascular;  Laterality: Right;  Rt SFA and Rt Ext Iliac   PORTACATH PLACEMENT Left 06/13/2016   Procedure: INSERTION PORT-A-CATH;  Surgeon: Oneil Budge, MD;  Location: [facility] ORS;  Service: General;  Laterality: Left;   TRANSURETHRAL RESECTION OF PROSTATE N/A 05/31/2020   Procedure: TRANSURETHRAL RESECTION OF THE PROSTATE (TURP);  Surgeon: Sherrilee Belvie CROME, MD;  Location: [facility] ORS;  Service: Urology;  Laterality: N/A;   VIDEO BRONCHOSCOPY WITH ENDOBRONCHIAL NAVIGATION N/A 05/28/2016   Procedure: VIDEO BRONCHOSCOPY WITH ENDOBRONCHIAL NAVIGATION;  Surgeon: Elspeth JAYSON Millers, MD;  Location: [facility] OR;  Service: Thoracic;  Laterality: N/A;   VIDEO BRONCHOSCOPY WITH ENDOBRONCHIAL NAVIGATION Bilateral 08/03/2023   Procedure: VIDEO BRONCHOSCOPY WITH ENDOBRONCHIAL NAVIGATION;  Surgeon: Shelah Lamar RAMAN, MD;  Location: [facility] ENDOSCOPY;  Service: Pulmonary;  Laterality: Bilateral;  WITH FLUORO   VIDEO BRONCHOSCOPY WITH ENDOBRONCHIAL ULTRASOUND N/A 05/28/2016   Procedure: VIDEO BRONCHOSCOPY WITH ENDOBRONCHIAL ULTRASOUND;  Surgeon: Elspeth JAYSON Millers, MD;  Location: [facility] OR;  Service: Thoracic;  Laterality: N/A;    Social History: Social History   Socioeconomic History   Marital status: Significant Other    Spouse name: Not on file   Number of children: 5   Years of education: 10   Highest education level: 10th grade  Occupational History   Occupation: Retired  Tobacco Use   Smoking status: Every Day    Current packs/day: 0.50    Average packs/day: 0.5 packs/day for 62.6 years (31.3 ttl pk-yrs)    Types: Cigarettes    Start date: 03/11/1961   Smokeless tobacco: Never   Tobacco comments:    1/2 pack to 1 pack per day- 07/30/2023, informed to not smoke 24 hrs prior to procedure.  Vaping Use   Vaping status: Never Used  Substance and Sexual Activity   Alcohol use: No   Drug use: No   Sexual activity: Yes     Birth control/protection: None  Other Topics Concern   Not on file  Social History Narrative   Dagoberto is his POA and lives with him. Children out of town - one in  [facility], others in [place].   Social Drivers of Corporate investment banker Strain: Low Risk  (08/06/2023)   Received from [facility]   Overall Financial Resource Strain (CARDIA)    Difficulty of Paying Living Expenses: Not hard at all  Food Insecurity: No Food Insecurity (08/10/2023)   Hunger Vital Sign    Worried About Running Out of Food in the Last Year: Never true    Ran Out of Food in the Last Year: Never true  Transportation Needs: No Transportation Needs (08/10/2023)   PRAPARE - Administrator, Civil Service (Medical): No    Lack of Transportation (Non-Medical): No  Physical Activity: Insufficiently Active (05/30/2023)   Exercise Vital Sign    Days of Exercise per Week: 3 days    Minutes of Exercise per Session: 10 min  Stress: No Stress Concern Present (05/30/2023)   [employer] of Occupational Health - Occupational Stress Questionnaire    Feeling of Stress : Not at all  Social Connections: Moderately Isolated (05/30/2023)   Social Connection and Isolation Panel    Frequency of Communication with Friends and Family: Twice a week    Frequency of Social Gatherings with Friends and Family: Twice a week    Attends Religious Services: Never    Database administrator or Organizations: No    Attends Banker Meetings: Never    Marital Status: Married  Catering manager Violence: Not At Risk (08/10/2023)   Humiliation, Afraid, Rape, and Kick questionnaire    Fear of Current or Ex-Partner: No    Emotionally Abused: No    Physically Abused: No    Sexually Abused: No    Family History: Family History  Problem Relation Age of Onset   Hypertension Mother    Diabetes Father    Heart disease Father    Stroke Father    Hypertension Sister    Sleep apnea Neg Hx     Current  Medications:  Current Outpatient Medications:    albuterol  (VENTOLIN  HFA) 108 (90 Base) MCG/ACT inhaler, INHALE 2 PUFFS INTO THE LUNGS EVERY 6 HOURS AS NEEDED FOR WHEEZE OR SHORTNESS OF BREATH, Disp: 8.5 each, Rfl: 1   allopurinol  (ZYLOPRIM ) 300 MG tablet, Take 1 tablet (300 mg total) by mouth daily., Disp: 90 tablet, Rfl: 0   Blood Glucose Monitoring Suppl (ONETOUCH VERIO REFLECT) w/Device KIT, Use to test blood sugars daily as directed. DX: E11.9, Disp: 1 kit, Rfl: 1   cephALEXin  (KEFLEX ) 500 MG capsule, Take 500 mg by mouth 3 (three) times daily., Disp: , Rfl:    clopidogrel  (PLAVIX ) 75 MG tablet, TAKE 1 TABLET BY MOUTH EVERY DAY, Disp: 90 tablet, Rfl: 3   Continuous Glucose Receiver (FREESTYLE LIBRE 3 READER) DEVI, 1 Device by Does not apply route every 14 (fourteen) days., Disp: 1 each, Rfl: 3   Continuous Glucose Sensor (FREESTYLE LIBRE 3 SENSOR) MISC, 1 Device by Does not apply route continuous., Disp: 1 each, Rfl: 2   diltiazem  (CARDIZEM  CD) 120 MG 24 hr capsule, Take 1 capsule (120 mg total) by mouth daily as needed., Disp: 90 capsule, Rfl: 3   docusate sodium  (COLACE) 100 MG capsule, Take 1 capsule (100 mg total) by mouth daily as needed for mild constipation., Disp: , Rfl:    dutasteride  (AVODART ) 0.5 MG capsule, Take 1 capsule (0.5 mg total) by mouth daily., Disp: 90 capsule, Rfl: 3   famotidine  (PEPCID ) 20 MG tablet, Take 1 tablet (20 mg total) by  mouth daily., Disp: 30 tablet, Rfl: 1   ferrous sulfate  325 (65 FE) MG EC tablet, Take 1 tablet (325 mg total) by mouth daily., Disp: , Rfl:    Fluticasone -Umeclidin-Vilant (TRELEGY ELLIPTA ) 100-62.5-25 MCG/ACT AEPB, TAKE 1 PUFF BY MOUTH EVERY DAY, Disp: 60 each, Rfl: 0   gabapentin  (NEURONTIN ) 300 MG capsule, Take 1 capsule (300 mg total) by mouth 3 (three) times daily., Disp: 270 capsule, Rfl: 2   insulin  degludec (TRESIBA  FLEXTOUCH) 100 UNIT/ML FlexTouch Pen, Inject 20 Units into the skin at bedtime., Disp: 18 mL, Rfl: 1   Insulin  Pen  Needle (BD PEN NEEDLE NANO U/F) 32G X 4 MM MISC, Use to inject insulin  once daily, Disp: 100 each, Rfl: 3   levothyroxine  (SYNTHROID ) 50 MCG tablet, Take 1 tablet (50 mcg total) by mouth daily., Disp: 90 tablet, Rfl: 1   linaclotide  (LINZESS ) 72 MCG capsule, Take 1 capsule (72 mcg total) by mouth daily before breakfast., Disp: 90 capsule, Rfl: 1   megestrol  (MEGACE ) 400 MG/10ML suspension, Take 10 mLs (400 mg total) by mouth 2 (two) times daily., Disp: 480 mL, Rfl: 3   metolazone  (ZAROXOLYN ) 2.5 MG tablet, Take 1 tablet (2.5 mg total) by mouth 3 (three) times a week., Disp: 45 tablet, Rfl: 2   mupirocin ointment (BACTROBAN) 2 %, Apply topically 2 (two) times daily., Disp: , Rfl:    NON FORMULARY, CPAP at bedtime, Disp: , Rfl:    ondansetron  (ZOFRAN ) 4 MG tablet, Take 1 tablet (4 mg total) by mouth every 8 (eight) hours as needed for nausea or vomiting., Disp: 90 tablet, Rfl: 1   OneTouch Delica Lancets 30G MISC, Use to test blood sugars daily as directed. DX: E11.9, Disp: 100 each, Rfl: 1   OXYGEN , Inhale 2 L into the lungs daily as needed (with Cpap at night)., Disp: , Rfl:    pantoprazole  (PROTONIX ) 40 MG tablet, Take 1 tablet (40 mg total) by mouth daily., Disp: 90 tablet, Rfl: 0   pravastatin  (PRAVACHOL ) 40 MG tablet, TAKE 1 TABLET (40 MG TOTAL) BY MOUTH ON MONDAYS , WEDNESDAYS, AND FRIDAYS AS DIRECTED, Disp: 48 tablet, Rfl: 4   prochlorperazine  (COMPAZINE ) 5 MG tablet, Take 1 tablet (5 mg total) by mouth every 6 (six) hours as needed for nausea or vomiting., Disp: 30 tablet, Rfl: 2   propranolol  (INDERAL ) 10 MG tablet, Take 1 tablet (10 mg total) by mouth 3 (three) times daily as needed., Disp: 90 tablet, Rfl: 6   spironolactone  (ALDACTONE ) 25 MG tablet, Take 1 tablet (25 mg total) by mouth 2 (two) times daily., Disp: 180 tablet, Rfl: 3   torsemide  (DEMADEX ) 20 MG tablet, TAKE 1/2 TABLET BY MOUTH DAILY, Disp: 45 tablet, Rfl: 0   zolpidem  (AMBIEN ) 5 MG tablet, Take 1 tablet (5 mg total) by mouth  at bedtime as needed for sleep., Disp: 30 tablet, Rfl: 2 No current facility-administered medications for this visit.  Facility-Administered Medications Ordered in Other Visits:    0.9 %  sodium chloride  infusion, , Intravenous, Continuous, Rogers Hai, MD, Last Rate: 10 mL/hr at 10/06/23 0933, New Bag at 10/06/23 0933   heparin  lock flush 100 unit/mL, 500 Units, Intracatheter, Once PRN, Victorya Hillman, MD   nivolumab  (OPDIVO ) 360 mg in sodium chloride  0.9 % 100 mL chemo infusion, 360 mg, Intravenous, Once, Rogers Hai, MD, Last Rate: 272 mL/hr at 10/06/23 1020, 360 mg at 10/06/23 1020   sodium chloride  flush (NS) 0.9 % injection 10 mL, 10 mL, Intracatheter, PRN, Brandii Lakey, MD  Allergies: Allergies  Allergen Reactions   Benadryl  [Diphenhydramine ] Other (See Comments)    Extreme leg pain and extreme somnolent      Bisoprolol  Other (See Comments)    Confusion, delirium    Sulfa  Antibiotics Swelling    Mouth swelling   Atorvastatin Other (See Comments)    Muscle aches - tolerating Pravastatin  40 mg MWF   Jardiance  [Empagliflozin ] Other (See Comments)    FEELS SLUGGISH, TIRED   Lopressor  [Metoprolol ] Other (See Comments)    Fatigue   Rosuvastatin Other (See Comments)    Muscle aches - tolerating Pravastatin  40 mg MWF   Doxycycline  Nausea Only   Temazepam  Other (See Comments)    Made insomnia worse    Tramadol  Itching    REVIEW OF SYSTEMS:   Review of Systems  Constitutional:  Negative for chills, fatigue and fever.  HENT:   Negative for lump/mass, mouth sores, nosebleeds, sore throat and trouble swallowing.   Eyes:  Negative for eye problems.  Respiratory:  Negative for cough and shortness of breath.   Cardiovascular:  Negative for chest pain, leg swelling and palpitations.  Gastrointestinal:  Positive for nausea. Negative for abdominal pain, constipation, diarrhea and vomiting.  Genitourinary:  Negative for bladder incontinence, difficulty  urinating, dysuria, frequency, hematuria and nocturia.   Musculoskeletal:  Negative for arthralgias, back pain, flank pain, myalgias and neck pain.       +buttock pain, 9/10 severity  Skin:  Negative for itching and rash.  Neurological:  Positive for headaches and numbness (in lower extremities). Negative for dizziness.  Hematological:  Does not bruise/bleed easily.  Psychiatric/Behavioral:  Positive for sleep disturbance. Negative for depression and suicidal ideas. The patient is not nervous/anxious.   All other systems reviewed and are negative.    VITALS:   There were no vitals taken for this visit.  Wt Readings from Last 3 Encounters:  10/06/23 144 lb 6.4 oz (65.5 kg)  09/09/23 152 lb 3.2 oz (69 kg)  09/08/23 151 lb (68.5 kg)    There is no height or weight on file to calculate BMI.  Performance status (ECOG): 1 - Symptomatic but completely ambulatory  PHYSICAL EXAM:   Physical Exam Vitals and nursing note reviewed. Exam conducted with a chaperone present.  Constitutional:      Appearance: Normal appearance.   Cardiovascular:     Rate and Rhythm: Normal rate and regular rhythm.     Pulses: Normal pulses.     Heart sounds: Normal heart sounds.  Pulmonary:     Effort: Pulmonary effort is normal.     Breath sounds: Normal breath sounds.  Abdominal:     Palpations: Abdomen is soft. There is no hepatomegaly, splenomegaly or mass.     Tenderness: There is no abdominal tenderness.   Musculoskeletal:     Right lower leg: No edema.     Left lower leg: No edema.  Lymphadenopathy:     Cervical: No cervical adenopathy.     Right cervical: No superficial, deep or posterior cervical adenopathy.    Left cervical: No superficial, deep or posterior cervical adenopathy.     Upper Body:     Right upper body: No supraclavicular or axillary adenopathy.     Left upper body: No supraclavicular or axillary adenopathy.   Neurological:     General: No focal deficit present.     Mental  Status: He is alert and oriented to person, place, and time.   Psychiatric:        Mood  and Affect: Mood normal.        Behavior: Behavior normal.     LABS:   CBC     Component Value Date/Time   WBC 21.3 (H) 10/06/2023 0758   RBC 3.01 (L) 10/06/2023 0758   HGB 9.6 (L) 10/06/2023 0758   HGB 13.0 08/12/2023 1230   HCT 32.2 (L) 10/06/2023 0758   HCT 38.2 08/12/2023 1230   PLT 611 (H) 10/06/2023 0758   PLT 250 08/12/2023 1230   MCV 107.0 (H) 10/06/2023 0758   MCV 96 08/12/2023 1230   MCH 31.9 10/06/2023 0758   MCHC 29.8 (L) 10/06/2023 0758   RDW 18.9 (H) 10/06/2023 0758   RDW 14.4 08/12/2023 1230   LYMPHSABS 1.3 10/06/2023 0758   LYMPHSABS 0.7 08/12/2023 1230   MONOABS 2.0 (H) 10/06/2023 0758   EOSABS 0.2 10/06/2023 0758   EOSABS 0.2 08/12/2023 1230   BASOSABS 0.2 (H) 10/06/2023 0758   BASOSABS 0.0 08/12/2023 1230    CMP      Component Value Date/Time   NA 135 10/06/2023 0758   NA 138 08/12/2023 1230   K 4.5 10/06/2023 0758   CL 97 (L) 10/06/2023 0758   CO2 28 10/06/2023 0758   GLUCOSE 146 (H) 10/06/2023 0758   BUN 28 (H) 10/06/2023 0758   BUN 14 08/12/2023 1230   CREATININE 1.44 (H) 10/06/2023 0758   CALCIUM 9.0 10/06/2023 0758   PROT 7.7 10/06/2023 0758   PROT 6.2 08/12/2023 1230   ALBUMIN 2.4 (L) 10/06/2023 0758   ALBUMIN 3.7 (L) 08/12/2023 1230   AST 13 (L) 10/06/2023 0758   ALT 13 10/06/2023 0758   ALKPHOS 102 10/06/2023 0758   BILITOT 0.2 10/06/2023 0758   BILITOT 0.4 08/12/2023 1230   GFRNONAA 50 (L) 10/06/2023 0758   GFRAA 102 05/24/2020 1238     No results found for: CEA1, CEA / No results found for: CEA1, CEA Lab Results  Component Value Date   PSA1 <0.1 10/31/2022   No results found for: CAN199 No results found for: RJW874  Lab Results  Component Value Date   TOTALPROTELP 7.0 04/18/2021   ALBUMINELP 3.7 04/18/2021   A1GS 0.3 04/18/2021   A2GS 0.8 04/18/2021   BETS 1.2 04/18/2021   GAMS 1.0 04/18/2021   MSPIKE Not  Observed 04/18/2021   SPEI Comment 04/18/2021   Lab Results  Component Value Date   TIBC 332 06/29/2023   TIBC 425 03/24/2023   TIBC 541 (H) 02/17/2023   FERRITIN 185 06/29/2023   FERRITIN 29 03/24/2023   FERRITIN 10 (L) 02/17/2023   IRONPCTSAT 25 06/29/2023   IRONPCTSAT 49 (H) 03/24/2023   IRONPCTSAT 4 (L) 02/17/2023   Lab Results  Component Value Date   LDH 133 08/02/2018   LDH 126 03/08/2018   LDH 141 10/28/2017     STUDIES:   DG Chest 2 View Result Date: 09/19/2023 CLINICAL DATA:  Productive cough for 1 week, COPD, history of non-small cell lung cancer EXAM: CHEST - 2 VIEW COMPARISON:  08/03/2023, 07/09/2023 FINDINGS: Frontal and lateral views of the chest demonstrates stable left chest wall port. 4.3 cm left upper lobe mass compatible with known history of lung cancer. Stable right perihilar consolidation within the right upper and right lower lobes. New ground-glass airspace disease within the left upper lobe could reflect post therapeutic change, infection, or inflammation. Right chest is clear. No effusion or pneumothorax. No acute bony abnormalities. Cardiac silhouette is unremarkable. IMPRESSION: 1. Stable left upper lobe mass consistent with history  of lung cancer. 2. Stable right perihilar consolidation within the right upper and right lower lobes consistent with known lung cancer. 3. New ground-glass airspace disease within the left upper lobe, which may be inflammatory, infectious, or related to post therapeutic change. Electronically Signed   By: Ozell Daring M.D.   On: 09/19/2023 21:13   VAS US  LOWER EXTREMITY ARTERIAL DUPLEX Result Date: 09/08/2023 LOWER EXTREMITY ARTERIAL DUPLEX STUDY Patient Name:  John [name]  Date of Exam:   09/08/2023 Medical Rec #: [id number]         Accession #:    [id number] Date of Birth: [birth year]         Patient Gender: M Patient Age:   33 years Exam Location:  [address] Procedure:      VAS US  LOWER EXTREMITY ARTERIAL DUPLEX Referring  Phys: LONNI GASKINS --------------------------------------------------------------------------------  High Risk Factors: Hypertension, hyperlipidemia, Diabetes, current smoker.  Vascular Interventions: 08/20/20: Angioplasty and stent right CIA.                         Right CFA endarterectomy, profundoplasty and pericardial                         patch angioplasty.                         05/30/21: Right prox SFA, dist SFA, and AK pop DCBA.                         Right prox CIA DCBA                         02/05/23: Right angioplasty and stent SFA and EIA. Current ABI:            Right 0.97/0.66 Left 0.84/0.49 Comparison Study: 02/25/23 at [facility] Rad[facility]: No evidence of significant inflow,                   outflow or runoff disease.                   Patent metallic stent present within the SFA Performing Technologist: King Pierre RVT  Examination Guidelines: A complete evaluation includes B-mode imaging, spectral Doppler, color Doppler, and power Doppler as needed of all accessible portions of each vessel. Bilateral testing is considered an integral part of a complete examination. Limited examinations for reoccurring indications may be performed as noted.  +-----------+--------+-----+--------+--------+--------+ RIGHT      PSV cm/sRatioStenosisWaveformComments +-----------+--------+-----+--------+--------+--------+ CFA Distal 69                   biphasic         +-----------+--------+-----+--------+--------+--------+ DFA        88                   biphasic         +-----------+--------+-----+--------+--------+--------+ SFA Prox   130                  biphasic         +-----------+--------+-----+--------+--------+--------+ SFA Mid                                 stent    +-----------+--------+-----+--------+--------+--------+ SFA Distal  stent    +-----------+--------+-----+--------+--------+--------+ POP Prox   56                    biphasic         +-----------+--------+-----+--------+--------+--------+ POP Distal 46                   biphasic         +-----------+--------+-----+--------+--------+--------+ ATA Distal 62                   biphasic         +-----------+--------+-----+--------+--------+--------+ PTA Distal 58                   biphasic         +-----------+--------+-----+--------+--------+--------+ PERO Distal42                   biphasic         +-----------+--------+-----+--------+--------+--------+  Right Stent(s): +-----------------+--------+--------+--------+--------+ mid to distal SFAPSV cm/sStenosisWaveformComments +-----------------+--------+--------+--------+--------+ Prox to Stent    96              biphasic         +-----------------+--------+--------+--------+--------+ Proximal Stent   79              biphasic         +-----------------+--------+--------+--------+--------+ Mid Stent        90              biphasic         +-----------------+--------+--------+--------+--------+ Distal Stent     88              biphasic         +-----------------+--------+--------+--------+--------+ Distal to Stent  104             biphasic         +-----------------+--------+--------+--------+--------+  Summary: Right: Patent SFA stent with no stenosis.  See table(s) above for measurements and observations. Electronically signed by Lonni Gaskins MD on 09/08/2023 at 11:00:41 AM.    Final    VAS US  AORTA/IVC/ILIACS Result Date: 09/08/2023 ABDOMINAL AORTA STUDY Patient Name:  John [name]  Date of Exam:   09/08/2023 Medical Rec #: [id number]         Accession #:    [id number] Date of Birth: October 01, 1945         Patient Gender: M Patient Age:   27 years Exam Location:  [address] Procedure:      VAS US  AORTA/IVC/ILIACS Referring Phys: LONNI GASKINS --------------------------------------------------------------------------------  Risk Factors: Hypertension,  Diabetes, current smoker. Vascular Interventions: 08/20/20: Angioplasty and stent right CIA.                         Right CFA endarterectomy, profundoplasty and pericardial                         patch angioplasty.                         05/30/21: Right prox SFA, dist SFA, and AK pop DCBA.                         Right prox CIA DCBA                         02/05/23: Right angioplasty and stent SFA and  EIA.  Performing Technologist: King Pierre RVT  Examination Guidelines: A complete evaluation includes B-mode imaging, spectral Doppler, color Doppler, and power Doppler as needed of all accessible portions of each vessel. Bilateral testing is considered an integral part of a complete examination. Limited examinations for reoccurring indications may be performed as noted.  Abdominal Aorta Findings: +-------------+-------+----------+----------+--------+--------+----------------+ Location     AP (cm)Trans (cm)PSV (cm/s)WaveformThrombusComments         +-------------+-------+----------+----------+--------+--------+----------------+ Distal                        41                                         +-------------+-------+----------+----------+--------+--------+----------------+ RT CIA Prox                   310       biphasic        Inflow or                                                                proximal stent   +-------------+-------+----------+----------+--------+--------+----------------+ RT CIA Mid                    173       biphasic                         +-------------+-------+----------+----------+--------+--------+----------------+ RT CIA Distal                 202       biphasic                         +-------------+-------+----------+----------+--------+--------+----------------+ RT EIA Prox                   178       biphasic                         +-------------+-------+----------+----------+--------+--------+----------------+ RT  EIA Mid                    213       biphasic                         +-------------+-------+----------+----------+--------+--------+----------------+ RT EIA Distal                 186       biphasic                         +-------------+-------+----------+----------+--------+--------+----------------+  Summary: Stenosis: >50% stenosis at the inflow/proximal right CIA stent.   *See table(s) above for measurements and observations.  Electronically signed by Lonni Gaskins MD on 09/08/2023 at 11:00:23 AM.    Final    VAS US  ABI WITH/WO TBI Result Date: 09/08/2023  LOWER EXTREMITY DOPPLER STUDY Patient Name:  COLUM COLT  Date of Exam:   09/08/2023 Medical Rec #: [id number]         Accession #:    [id number]  Date of Birth: 07/01/1945         Patient Gender: M Patient Age:   54 years Exam Location:  [address] Procedure:      VAS US  ABI WITH/WO TBI Referring Phys: LONNI GASKINS --------------------------------------------------------------------------------  Indications: Peripheral artery disease. High Risk Factors: Hypertension, hyperlipidemia, Diabetes, current smoker.  Vascular Interventions: 08/20/20: Angioplasty and stent right CIA.                         Right CFA endarterectomy, profundoplasty and pericardial                         patch angioplasty.                         05/30/21: Right prox SFA, dist SFA, and AK pop DCBA.                         Right prox CIA DCBA                         02/05/23: Right angioplasty and stent SFA and EIA. Comparison Study: 02/25/23 at [facility] Radiology: No evidence of significant inflow,                   outflow or runoff disease.                   Patent metallic stent present within the SFA. Performing Technologist: King Pierre RVT  Examination Guidelines: A complete evaluation includes at minimum, Doppler waveform signals and systolic blood pressure reading at the level of bilateral brachial, anterior tibial, and posterior tibial arteries, when  vessel segments are accessible. Bilateral testing is considered an integral part of a complete examination. Photoelectric Plethysmograph (PPG) waveforms and toe systolic pressure readings are included as required and additional duplex testing as needed. Limited examinations for reoccurring indications may be performed as noted.  ABI Findings: +---------+------------------+-----+--------+--------+ Right    Rt Pressure (mmHg)IndexWaveformComment  +---------+------------------+-----+--------+--------+ Brachial 158                                     +---------+------------------+-----+--------+--------+ ATA      153               0.97 biphasic         +---------+------------------+-----+--------+--------+ PTA      154               0.97 biphasic         +---------+------------------+-----+--------+--------+ Great Toe105               0.66                  +---------+------------------+-----+--------+--------+ +---------+------------------+-----+----------+-------+ Left     Lt Pressure (mmHg)IndexWaveform  Comment +---------+------------------+-----+----------+-------+ Brachial 158                                      +---------+------------------+-----+----------+-------+ ATA      97                0.61 monophasic        +---------+------------------+-----+----------+-------+ PTA      132  0.84 monophasic        +---------+------------------+-----+----------+-------+ Great Toe78                0.49                   +---------+------------------+-----+----------+-------+ +-------+-----------+-----------+------------+------------+ ABI/TBIToday's ABIToday's TBIPrevious ABIPrevious TBI +-------+-----------+-----------+------------+------------+ Right  0.97       0.66       0.77                     +-------+-----------+-----------+------------+------------+ Left   0.84       0.49       Not done                  +-------+-----------+-----------+------------+------------+  Previous ABI on 02/25/23 at [facility].  Summary: Right: Resting right ankle-brachial index is within normal range. The right toe-brachial index is abnormal. Left: The left toe-brachial index is abnormal. *See table(s) above for measurements and observations.  Electronically signed by Lonni Gaskins MD on 09/08/2023 at 11:00:03 AM.    Final    CUP PACEART REMOTE DEVICE CHECK Result Date: 09/07/2023 ILR summary report received. Battery status OK. Normal device function. No new symptom, tachy, brady, or pause episodes. No new AF episodes. 36 AT events, longest 14 hrs, V-rates 115-122 bpm. Increase in frequency of events.  Sending for awareness.  Monthly summary reports and ROV/PRN [place], CV[facility]

## 2023-10-06 NOTE — Progress Notes (Signed)
 Patient presents today for chemotherapy infusion. Patient is in satisfactory condition with no new complaints voiced.  Vital signs are stable.  Labs reviewed by Dr. Rogers during the office visit and all labs are within treatment parameters.  D/C Taxol  and Carboplatin  per Dr. Rogers.  We will treat with just Opdivo  moving forward.  We will also give NS 500 mL over 1 hour today per Dr. Rogers.  We will proceed with treatment per MD orders.   Patient tolerated treatment well with no complaints voiced.  Patient left via wheelchair with wife in stable condition.  Vital signs stable at discharge.  Follow up as scheduled.

## 2023-10-06 NOTE — Progress Notes (Signed)
 Received message from MD:  DC Taxol /Carboplatin  from treatment plan.  Continue:  Opdivo  360 mg q 21 days [place]  1 mg/kg q 42 days  Plan updated.  V.O. Dr Theadore Molt, PharmD

## 2023-10-06 NOTE — Progress Notes (Signed)
 Patient has been examined by Dr. Rogers. Vital signs and labs have been reviewed by MD - ANC, Creatinine, LFTs, hemoglobin, and platelets are within treatment parameters per M.D. - pt may proceed with treatment. Chemotherapy discontinued due to intolerance per MD. Primary RN and pharmacy notified.

## 2023-10-06 NOTE — Telephone Encounter (Signed)
 During visit, Dagoberto Irving, friend of Mr. Neville inquired about assistance from [facility] [employer] for personal care services. After reaching out to them, patient has been approved for personal care services through plan and can expect a call to set up a time to see patient in home.  As for additional services, he is currently on a waiting list and cannot be moved up at this time, as it is first come first serve.  Made Dagoberto aware and ensured that she had their contact number for follow up.

## 2023-10-06 NOTE — Patient Instructions (Signed)
 [facility] - A DEPT OF [facility]  Discharge Instructions: Thank you for choosing [facility] to provide your oncology and hematology care.  If you have a lab appointment with the Cancer Center - please note that after April 8th, 2024, all labs will be drawn in the cancer center.  You do not have to check in or register with the main entrance as you have in the past but will complete your check-in in the cancer center.  Wear comfortable clothing and clothing appropriate for easy access to any Portacath or PICC line.   We strive to give you quality time with your provider. You may need to reschedule your appointment if you arrive late (15 or more minutes).  Arriving late affects you and other patients whose appointments are after yours.  Also, if you miss three or more appointments without notifying the office, you may be dismissed from the clinic at the provider's discretion.      For prescription refill requests, have your pharmacy contact our office and allow 72 hours for refills to be completed.    Today you received the following chemotherapy and/or immunotherapy agents Opdivo.Nivolumab Injection What is this medication? NIVOLUMAB (nye VOL ue mab) treats some types of cancer. It works by helping your immune system slow or stop the spread of cancer cells. It is a monoclonal antibody. This medicine may be used for other purposes; ask your health care provider or pharmacist if you have questions. COMMON BRAND NAME(S): Opdivo What should I tell my care team before I take this medication? They need to know if you have any of these conditions: Allogeneic stem cell transplant (uses someone else's stem cells) Autoimmune diseases, such as Crohn disease, ulcerative colitis, lupus History of chest radiation Nervous system problems, such as Guillain-Barre syndrome or myasthenia gravis Organ transplant An unusual or allergic reaction to nivolumab, other  medications, foods, dyes, or preservatives Pregnant or trying to get pregnant Breast-feeding How should I use this medication? This medication is infused into a vein. It is given in a hospital or clinic setting. A special MedGuide will be given to you before each treatment. Be sure to read this information carefully each time. Talk to your care team about the use of this medication in children. While it may be prescribed for children as young as 12 years for selected conditions, precautions do apply. Overdosage: If you think you have taken too much of this medicine contact a poison control center or emergency room at once. NOTE: This medicine is only for you. Do not share this medicine with others. What if I miss a dose? Keep appointments for follow-up doses. It is important not to miss your dose. Call your care team if you are unable to keep an appointment. What may interact with this medication? Interactions have not been studied. This list may not describe all possible interactions. Give your health care provider a list of all the medicines, herbs, non-prescription drugs, or dietary supplements you use. Also tell them if you smoke, drink alcohol, or use illegal drugs. Some items may interact with your medicine. What should I watch for while using this medication? Your condition will be monitored carefully while you are receiving this medication. You may need blood work while taking this medication. This medication may cause serious skin reactions. They can happen weeks to months after starting the medication. Contact your care team right away if you notice fevers or flu-like symptoms with  a rash. The rash may be red or purple and then turn into blisters or peeling of the skin. You may also notice a red rash with swelling of the face, lips, or lymph nodes in your neck or under your arms. Tell your care team right away if you have any change in your eyesight. Talk to your care team if you are  pregnant or think you might be pregnant. A negative pregnancy test is required before starting this medication. A reliable form of contraception is recommended while taking this medication and for 5 months after the last dose. Talk to your care team about effective forms of contraception. Do not breast-feed while taking this medication and for 5 months after the last dose. What side effects may I notice from receiving this medication? Side effects that you should report to your care team as soon as possible: Allergic reactions--skin rash, itching, hives, swelling of the face, lips, tongue, or throat Dry cough, shortness of breath or trouble breathing Eye pain, redness, irritation, or discharge with blurry or decreased vision Heart muscle inflammation--unusual weakness or fatigue, shortness of breath, chest pain, fast or irregular heartbeat, dizziness, swelling of the ankles, feet, or hands Hormone gland problems--headache, sensitivity to light, unusual weakness or fatigue, dizziness, fast or irregular heartbeat, increased sensitivity to cold or heat, excessive sweating, constipation, hair loss, increased thirst or amount of urine, tremors or shaking, irritability Infusion reactions--chest pain, shortness of breath or trouble breathing, feeling faint or lightheaded Kidney injury (glomerulonephritis)--decrease in the amount of urine, red or dark Jaydalyn Demattia urine, foamy or bubbly urine, swelling of the ankles, hands, or feet Liver injury--right upper belly pain, loss of appetite, nausea, light-colored stool, dark yellow or Irven Ingalsbe urine, yellowing skin or eyes, unusual weakness or fatigue Pain, tingling, or numbness in the hands or feet, muscle weakness, change in vision, confusion or trouble speaking, loss of balance or coordination, trouble walking, seizures Rash, fever, and swollen lymph nodes Redness, blistering, peeling, or loosening of the skin, including inside the mouth Sudden or severe stomach pain,  bloody diarrhea, fever, nausea, vomiting Side effects that usually do not require medical attention (report these to your care team if they continue or are bothersome): Bone, joint, or muscle pain Diarrhea Fatigue Loss of appetite Nausea Skin rash This list may not describe all possible side effects. Call your doctor for medical advice about side effects. You may report side effects to FDA at 1-800-FDA-1088. Where should I keep my medication? This medication is given in a hospital or clinic. It will not be stored at home. NOTE: This sheet is a summary. It may not cover all possible information. If you have questions about this medicine, talk to your doctor, pharmacist, or health care provider.  2024 Elsevier/Gold Standard (2021-07-22 00:00:00)       To help prevent nausea and vomiting after your treatment, we encourage you to take your nausea medication as directed.  BELOW ARE SYMPTOMS THAT SHOULD BE REPORTED IMMEDIATELY: *FEVER GREATER THAN 100.4 F (38 C) OR HIGHER *CHILLS OR SWEATING *NAUSEA AND VOMITING THAT IS NOT CONTROLLED WITH YOUR NAUSEA MEDICATION *UNUSUAL SHORTNESS OF BREATH *UNUSUAL BRUISING OR BLEEDING *URINARY PROBLEMS (pain or burning when urinating, or frequent urination) *BOWEL PROBLEMS (unusual diarrhea, constipation, pain near the anus) TENDERNESS IN MOUTH AND THROAT WITH OR WITHOUT PRESENCE OF ULCERS (sore throat, sores in mouth, or a toothache) UNUSUAL RASH, SWELLING OR PAIN  UNUSUAL VAGINAL DISCHARGE OR ITCHING   Items with * indicate a potential emergency and should  be followed up as soon as possible or go to the Emergency Department if any problems should occur.  Please show the CHEMOTHERAPY ALERT CARD or IMMUNOTHERAPY ALERT CARD at check-in to the Emergency Department and triage nurse.  Should you have questions after your visit or need to cancel or reschedule your appointment, please contact [facility] [place] - A DEPT OF Eligha [facility] [phone]  and follow the prompts.  Office hours are 8:00 a.m. to 4:30 p.m. Monday - Friday. Please note that voicemails left after 4:00 p.m. may not be returned until the following business day.  We are closed weekends and major holidays. You have access to a nurse at all times for urgent questions. Please call the main number to the clinic [phone] and follow the prompts.  For any non-urgent questions, you may also contact your provider using MyChart. We now offer e-Visits for anyone 46 and older to request care online for non-urgent symptoms. For details visit [email or web address].   Also download the MyChart app! Go to the app store, search "MyChart", open the app, select [place], and log in with your MyChart username and password.

## 2023-10-06 NOTE — Progress Notes (Signed)
   10/06/23 1000  Spiritual Encounters  Type of Visit Initial  Care provided to: Pt and family  Conversation partners present during encounter Other (comment) (Significant other John [name])  Referral source IDT Rounds  Reason for visit Routine spiritual support  OnCall Visit No  Spiritual Framework  Presenting Themes Significant life change;Caregiving needs  Community/Connection Family  Interventions  Spiritual Care Interventions Made Established relationship of care and support;Narrative/life review;Other (comment) (Introduction to Spiritual Care)  Intervention Outcomes  Outcomes Awareness around self/spiritual resourses  Spiritual Care Plan  Spiritual Care Issues Still Outstanding No further spiritual care needs at this time (see row info)  Follow up plan  No further interventions plannned unless requested   Reason for Visit: Chaplain making rounds on the floor visiting infusion Pts   Description of Visit: Arriving in the room I found John [name] in a recliner chair receiving treatment, and significant other, John [name], present there as well.  I introduced myself as the new chaplain for the cancer center and provided education to explain that the role of a chaplain is more than just a religious presence.  John [name] and John [name] were receptive to my presence, and we began a conversation.   John [name] was responsive to specific questions I asked him, and he reacted to jokes and other things, but for the most part he allowed John [name] to speak for him.  John [name] shared with me John [name]'s cancer journey that began in 2018. She also shared John [name]'s most recent issues with chemo and that they have spoken with doctor about not doing th enext round of chemo.  John [name] spoke of this but John [name] seemed informed and in agreement with the plan.   Plan of Care: John [name] appears to be well supported by John [name] and family.  I do not anticipate planning any further spiritual care interventions beyond checking-in in a few months to reassess.     John [name], MDiv   Chaplain, [facility]  [name] [phone]

## 2023-10-06 NOTE — Patient Instructions (Signed)
 [facility] at [facility] Discharge Instructions   You were seen and examined today by Dr. Rogers.  He reviewed the results of your lab work which are normal/stable.   We will proceed with your treatment today. We will discontinue the chemotherapy. We will continue to give the immunotherapy (Opdivo ) every 3 weeks.   Return as scheduled.    Thank you for choosing [facility] at [facility] to provide your oncology and hematology care.  To afford each patient quality time with our provider, please arrive at least 15 minutes before your scheduled appointment time.   If you have a lab appointment with the Cancer Center please come in thru the Main Entrance and check in at the main information desk.  You need to re-schedule your appointment should you arrive 10 or more minutes late.  We strive to give you quality time with our providers, and arriving late affects you and other patients whose appointments are after yours.  Also, if you no show three or more times for appointments you may be dismissed from the clinic at the providers discretion.     Again, thank you for choosing [facility].  Our hope is that these requests will decrease the amount of time that you wait before being seen by our physicians.       _____________________________________________________________  Should you have questions after your visit to [facility], please contact our office at [phone] and follow the prompts.  Our office hours are 8:00 a.m. and 4:30 p.m. Monday - Friday.  Please note that voicemails left after 4:00 p.m. may not be returned until the following business day.  We are closed weekends and major holidays.  You do have access to a nurse 24-7, just call the main number to the clinic [phone] and do not press any options, hold on the line and a nurse will answer the phone.    For prescription refill requests, have your  pharmacy contact our office and allow 72 hours.    Due to Covid, you will need to wear a mask upon entering the hospital. If you do not have a mask, a mask will be given to you at the Main Entrance upon arrival. For doctor visits, patients may have 1 support person age 44 or older with them. For treatment visits, patients can not have anyone with them due to social distancing guidelines and our immunocompromised population.

## 2023-10-07 DIAGNOSIS — G4733 Obstructive sleep apnea (adult) (pediatric): Secondary | ICD-10-CM | POA: Diagnosis not present

## 2023-10-07 DIAGNOSIS — J9611 Chronic respiratory failure with hypoxia: Secondary | ICD-10-CM | POA: Diagnosis not present

## 2023-10-07 DIAGNOSIS — I480 Paroxysmal atrial fibrillation: Secondary | ICD-10-CM | POA: Diagnosis not present

## 2023-10-08 ENCOUNTER — Inpatient Hospital Stay

## 2023-10-08 ENCOUNTER — Ambulatory Visit (INDEPENDENT_AMBULATORY_CARE_PROVIDER_SITE_OTHER)

## 2023-10-08 DIAGNOSIS — R000 Tachycardia, unspecified: Secondary | ICD-10-CM | POA: Diagnosis not present

## 2023-10-08 NOTE — Progress Notes (Signed)
 Hospitalist Progress Note 10/08/23  LOS: 2 days       Patient name: John [name]. Netz DOB 04-Aug-1945 MRN#: [id number] PCP: Sheffield Rouse F Date: 10/08/23 Time: 3:49 PM Primary Care Provider:  Sheffield Rouse F Inpatient primary attending provider: Margart Elsie Dragon, DO   Timeline of Significant Events: No notes on file  _____________________________________  Admission HPI   Patient admitted on: 10/06/2023  7:17 PM  Patient admitted by: Thersia Roger, D.O. CHIEF COMPLAINT:      Chief Complaint  Patient presents with  . Weakness      Day of admission HPI:   October 06, 2023 9:09 PM    Patient admitted on Home O2? - Yes Patient on home anticoagulant? -  No Patient admitted with Chronic home foley catheter? - No Foley catheter placed or replaced by another service prior to admission? - No   Mental Status on Admission: The patient is Alert and oriented to PERSON The patient is NOT Alert And oriented to TIME The patient is NOT Alert and oriented to LOCATION   This is a 78 y.o. male with a known history of home O2 dependent COPD, HTN, HLD, OSA, lung cancer on immunotherapy, anxiety/depression, CHF, DM2, hypothyroidism, PAD, GERD, OA, afibpresents to the emergency department for evaluation of weakness.  He was brought in by wife who had difficulty getting him inside his home after his immunotherapy due to weakness.  Initially was found by EMS to be febrile, hypotensive and tachycardic.  Patient will wake up and respond with brief answers to some questions, but quickly falls back asleep.  Cannot provide meaningful history.   In the ED the patient received Rocephin , Azithro.   Medical admission was requested for further workup and management of sepsis 2/2 pneumonia, AKI, macrocytic anemia.   Problem List, Assessment & Plan    ASSESSMENT & PLAN (In order of descending acuity)  Sepsis 2/2 HCAP - Sepsis probable, patient with acute on chronic hypoxic respiratory  failure requiring BiPAP intermittently throughout the day, up to 100% FiO2 the morning of 7/3. - Continue broad-spectrum antibiotics - Continue gentle IV fluids - Of note patient received Opdivo  infusion (7/1) at the cancer center, recent lung biopsies positive for non small cell recurrence - Follow up cultures and tailor antibiotics if needed   AKI - Ruled out/unlikely - Patient with mildly elevated creatinine at 1.34 on admission, trending downward with IV fluids   Macrocytic anemia - Hemoglobin stable with no signs of bleeding - Continue vitamin supplementation - Iron  level is low at 18, recommend supplementation   HTN  - resume clonidine , losartan , hydrochlorothiazide    HLD  - resume pravastatin    COPD  - resume Flonase , Incruse - O2 and nebs PRN   BPH - continue finasteride , tamsulosin    DM2 - Accuchecks achs with ISS - Carb controlled diet - Check A1C    ADDITIONAL PATIENT FINDINGS OR OBSERVATIONS  Patient drowsy, on BiPAP this morning with FiO2 100%. Rhonchorous breath sounds continue bilaterally worse in right upper/middle lobe Patient's significant other is at bedside again this morning Heart rate tachycardic and rhythm, no murmur Extremities with very minimal edema Neuroexam nonfocal with no unilateral deficits, alert and oriented x 2  DVT prophylaxis while in hospital:  enoxaparin   _____________________________________  Discharge estimated within 2-3 days. Anticipated disposition:  TBD  Nutrition: Nutrition Therapy Heart Healthy; Consistent Carb 60/60/60 (4/4/4); Soft & Bite Sized, Level 6  CODE STATUS :                    Full Code  ______________________________________________________________________  Vital signs in last 24 hours: Temp:  [36.3 C (97.3 F)-36.9 C (98.4 F)] 36.7 C (98.1 F) Pulse:  [97-111] 105 Resp:  [15-21] 18 BP: (94-127)/(51-61) 103/58 MAP (mmHg):  [63-85] 72 FiO2 (%):  [30 %-70 %] 44 % SpO2:  [87  %-100 %] 98 %  Intake/Output last 3 shifts: I/O last 3 completed shifts: In: 790 [P.O.:240; I.V.:200; IV Piggyback:350] Out: 2400 [Urine:2400]   Consults  IP CONSULT TO HOSPITALIST IP CONSULT TO [facility]ARM[facility] [place]   Allergies  Allergies[1]    Past Medical History  Past Medical History[2]     MEDS Scheduled Meds:Scheduled Medications[3] Continuous Infusions:Infusions Meds[4] PRN Meds:.PRN Medications[5]   Recent Lab Results   Lab Results  Component Value Date   WBC 18.1 (H) 10/08/2023   HGB 8.4 (L) 10/08/2023   HCT 27.9 (L) 10/08/2023   PLT 529 (H) 10/08/2023    Lab Results  Component Value Date   NA 139 10/08/2023   K 4.0 10/08/2023   CL 101 10/08/2023   CO2 30.5 10/08/2023   BUN 19 10/08/2023   CREATININE 1.08 10/08/2023   GLU 132 10/08/2023   CALCIUM 9.2 10/08/2023    Lab Results  Component Value Date   BILITOT 0.2 (L) 10/06/2023   PROT 7.6 10/06/2023   ALBUMIN 2.1 (L) 10/06/2023   ALT 13 10/06/2023   AST 15 10/06/2023   ALKPHOS 116 10/06/2023    Lab Results  Component Value Date   PT 13.4 (H) 10/06/2023   INR 1.21 10/06/2023    Imaging  Echocardiogram W Colorflow Spectral Doppler With Contrast Result Date: 10/07/2023 Patient Info Name:     John [name] Age:     66 years DOB:     1945-04-09 Gender:     Male MRN:     [id number] Accession #:     [id number] [facility] Account #:     [id number] Ht:     168 cm Wt:     67 kg BSA:     1.77 m2 BP:     113 /     59 mmHg HR:     102 bpm Exam Date:     10/07/2023 8:04 AM Admit Date:     10/06/2023 Exam Type:     ECHOCARDIOGRAM W COLORFLOW SPECTRAL DOPPLER W CONTRAST Technical Quality:     Poor Reason for Poor Study:     poor echocardiographic windows Staff Sonographer:     Mliss Gate Supervising Physician:     Marinell Fairy How MD Ordering Physician:     Joana Chamber Panwala Study Info Indications      - Sepsis ; Definity /Optison  Procedure(s)   Complete two-dimensional, color flow and Doppler  transthoracic echocardiogram is performed with contrast to opacify the left ventricle and to improve the delineation of the left ventricle endocardial borders. Summary   1. Technically difficult study.   2. The left ventricle is normal in size with normal wall thickness.   3. The left ventricular systolic function is normal, LVEF is visually estimated at > 55%.   4. The mitral valve leaflets are moderately thickened with mildly reduced leaflet mobility.   5. Mitral annular calcification is present.   6. There is mild mitral stenosis.   7. The left atrium is mildly dilated in size.   8. The right ventricle is normal in size, with  normal systolic function. Left Ventricle   The left ventricle is normal in size with normal wall thickness. The left ventricular systolic function is normal, LVEF is visually estimated at > 55%. Left ventricular diastolic function cannot be accurately assessed. Right Ventricle   The right ventricle is normal in size, with normal systolic function. Left Atrium   The left atrium is mildly dilated in size. Right Atrium   The right atrium is normal in size. Aortic Valve   There is no significant aortic regurgitation. There is no evidence of a significant transvalvular gradient. Suboptimally imaged aortic valve appears thickened. Mitral Valve   The mitral valve leaflets are moderately thickened with mildly reduced leaflet mobility. Mitral annular calcification is present. There is trivial mitral valve regurgitation. There is mild mitral stenosis. Mean diastolic gradient: 5 mmHg at a heart rate of 102 bpm. Tricuspid Valve   The tricuspid valve leaflets are normal, with normal leaflet mobility. There is trivial tricuspid regurgitation. There is no pulmonary hypertension. TR maximum velocity: 2.3 m/s  Estimated PASP: 24 mmHg. Pulmonic Valve   Pulmonary valve is not well visualized. There is no significant pulmonic regurgitation. There is no evidence of a significant transvalvular gradient. Aorta    The aorta is normal in size in the visualized segments. Inferior Vena Cava   IVC size and inspiratory change suggest normal right atrial pressure. (0-5 mmHg). Pericardium/Pleural   There is no pericardial effusion. Ventricles ---------------------------------------------------------------------- Name                                 Value        Normal ---------------------------------------------------------------------- LV Dimensions 2D/MM ----------------------------------------------------------------------  IVS Diastolic Thickness (2D)                                0.6 cm       0.6-1.0 LVID Diastole (2D)                  4.2 cm       4.2-5.8  LVPW Diastolic Thickness (2D)                                0.6 cm       0.6-1.0 LVID Systole (2D)                   3.3 cm       2.5-4.0 LVOT Diameter                       1.7 cm               LV Mass Index (2D Cubed)           40 g/m2        49-115  Relative Wall Thickness (2D)                                  0.29        <=0.42 LV Function ---------------------------------------------------------------------- LV EF (4C MOD)                        65 %  LV Diastolic Volume Index (BP MOD)                        35.4 ml/m2     34.0-74.0 LV EF (BP MOD)                        77 %         52-72 RV Dimensions 2D/MM ----------------------------------------------------------------------  RV Basal Diastolic Dimension                           3.0 cm       2.5-4.1 Atria ---------------------------------------------------------------------- Name                                 Value        Normal ---------------------------------------------------------------------- LA Dimensions ---------------------------------------------------------------------- LA Dimension (2D)                   4.3 cm       3.0-4.1 LA Volume Index (4C A-L)        25.95 ml/m2               LA Volume Index (2C A-L)        15.73 ml/m2               RA Dimensions  ---------------------------------------------------------------------- RA Area (4C)                      14.2 cm2        <=18.0 RA Area (4C) Index              8.0 cm2/m2               RA ESV Index (4C MOD)             21 ml/m2         18-32 Left Ventricular Outflow Tract ---------------------------------------------------------------------- Name                                 Value        Normal ---------------------------------------------------------------------- LVOT 2D ---------------------------------------------------------------------- LVOT Diameter                       1.7 cm               LVOT Area                          2.3 cm2               LVOT Doppler ---------------------------------------------------------------------- LVOT Peak Velocity                 0.7 m/s               LVOT VTI                             10 cm               LVOT Stroke Volume                   22 ml  LVOT SI                           13 ml/m2 Aortic Valve ---------------------------------------------------------------------- Name                                 Value        Normal ---------------------------------------------------------------------- AV Doppler ---------------------------------------------------------------------- AV Peak Velocity                   1.4 m/s               AV Peak Gradient                    8 mmHg               AV Mean Gradient                    4 mmHg               AV VTI                               24 cm               AV Area (Cont Eq VTI)              1.0 cm2         >=3.0 AV Area Index (Cont Eq VTI)     0.5 cm2/m2               AV Area (Cont Eq Vel)              1.1 cm2               AV Area Index (Cont Eq Vel)     0.6 cm2/m2               AV DI (Vel)                           0.50               AV DI (VTI)                           0.42 Mitral Valve ---------------------------------------------------------------------- Name                                 Value         Normal ---------------------------------------------------------------------- MV Doppler ---------------------------------------------------------------------- MV Mean Gradient                    5 mmHg               MV Diastolic Function ---------------------------------------------------------------------- MV E Peak Velocity                127 cm/s               MV A Peak Velocity                138 cm/s               MV E/A  0.9               MV Annular TDI ---------------------------------------------------------------------- MV Septal e' Velocity             9.0 cm/s         >=8.0 MV E/e' (Septal)                      14.1               MV Lateral e' Velocity            6.7 cm/s        >=10.0 MV E/e' (Lateral)                     18.8               MV e' Average                     7.9 cm/s               MV E/e' (Average)                     16.5 Tricuspid Valve ---------------------------------------------------------------------- Name                                 Value        Normal ---------------------------------------------------------------------- TV Regurgitation Doppler ---------------------------------------------------------------------- TR Peak Velocity                   2.3 m/s               Estimated PAP/RSVP ---------------------------------------------------------------------- RA Pressure                         3 mmHg           <=5 RV Systolic Pressure               24 mmHg           <36 Aorta ---------------------------------------------------------------------- Name                                 Value        Normal ---------------------------------------------------------------------- Ascending Aorta ---------------------------------------------------------------------- Ao Root Diameter (2D)               2.0 cm               Ao Root Diam Index (2D)          1.1 cm/m2               Ascending Aorta Diameter            3.3 cm Venous  ---------------------------------------------------------------------- Name                                 Value        Normal ---------------------------------------------------------------------- IVC/SVC ---------------------------------------------------------------------- IVC Diameter (Insp 2D)              0.6 cm               IVC Diameter (Exp 2D)               1.5 cm         <=  2.1  IVC Diameter Percent Change (2D)                                  60 %          >=50 Report Signatures Finalized by Marinell Fairy How  MD on 10/07/2023 12:11 PM  XR Chest Portable Result Date: 10/06/2023 Exam:  Portable Chest  History:  78 year old with chest pain.  Technique:  Single frontal view.  Comparison:  08/05/2023  Findings:   There is no significant interval change. Confluent opacity in the left upper lobe near the apex is again noted measuring up to 5 cm. There is a suprahilar opacity in the left lung also noted appearing slightly more dense than on the prior exam. There is confluent opacity in the medial right upper lobe also noted similar to the prior exam. The heart and mediastinum are stable. No pneumothorax or pleural fluid. Left-sided Port-A-Cath is unchanged. Bony structures are stable.    1.    There is no significant interval change in the confluent dominant mass in the left lung apex and medial right upper lobe confluent mass. 2.    There is a suprahilar opacity in the left lung which appears slightly more dense than on the prior exam. Superimpose infection or interval increase in mass is not excluded. Increase in focal scarring is also a consideration. This could be better characterize with chest CT if clinically warranted.  Signed (Electronic Signature): 10/06/2023 8:15 PM Signed By: Ozell JONETTA Quale, MD   Margart Dragon, DO Hospitalist, [facility] 10/08/23, 3:49 PM        [1] Allergies Allergen Reactions  . Bisoprolol  Other (See Comments)    Confusion, delirium  . Sulfa  (Sulfonamide  Antibiotics) Swelling    Mouth swelling  . Atorvastatin Other (See Comments)    Muscle aches - tolerating Pravastatin  40 mg MWF  . Empagliflozin  Other (See Comments)    FEELS SLUGGISH, TIRED  . Metoprolol  Other (See Comments)    Fatigue  . Rosuvastatin Other (See Comments)    Muscle aches - tolerating Pravastatin  40 mg MWF  . Doxycycline  Nausea Only  . Temazepam  Other (See Comments)    Made insomnia worse  . Tramadol  Itching  [2] Past Medical History: Diagnosis Date  . Atrial fibrillation      . Cancer      . CHF (congestive heart failure)      . COPD (chronic obstructive pulmonary disease)      . Diabetes mellitus      [3] . cloNIDine  HCL  0.1 mg Oral BID  . enoxaparin  (LOVENOX ) injection  40 mg Subcutaneous Q24H  . fentaNYL   1 patch Transdermal Q72H  . ferrous sulfate   325 mg Oral Every other day  . finasteride   5 mg Oral Daily  . fluticasone  furoate-vilanterol  1 puff Inhalation Daily (RT)  . folic acid   1 mg Oral Daily  . gabapentin   300 mg Oral TID  . insulin  lispro  0-20 Units Subcutaneous ACHS  . losartan -hydroCHLOROthiazide  (HYZAAR) 100-25 mg COMBO product   Oral Daily  . megestrol   200 mg Oral 3xd Meals  . piperacillin -tazobactam  3.375 g Intravenous Q8H  . pravastatin   40 mg Oral Mon,Wed,Fri  . senna-docusate  1 tablet Oral Nightly  . tamsulosin   0.4 mg Oral Daily  . torsemide   10 mg Oral Daily  . umeclidinium  1 puff Inhalation Daily (RT)  .  vancomycin   1,000 mg Intravenous Q18H  [4] [5] acetaminophen , acetaminophen , albuterol , aluminum-magnesium  hydroxide-simethicone , dextrose , dextrose  in water , glucagon , guaiFENesin , melatonin, naloxone, ondansetron  **OR** ondansetron , polyethylene glycol

## 2023-10-08 NOTE — Nursing Note (Signed)
 At this time patient no longer needs sitter.

## 2023-10-09 NOTE — Discharge Summary (Signed)
 Hospitalist Discharge Summary   Discharge date:   October 09, 2023 Length of stay:    LOS: 3 days    Discharge Service:   [facility] Hospitalists Discharge Attending Physician: Margart Elsie Dragon, DO Discharge to:    To Home Condition at Discharge:  fair   Mental Status On day of Discharge:  The patient is Alert and oriented to PERSON The patient is Alert And oriented to TIME The patient is Alert and oriented to [facility] course based on timeline of significant events after admission (by date): 7/3: Overnight patient required BiPAP due to desaturation into the 70s, was able to wean to 6 L nasal cannula during the day, and placed back on BiPAP in the afternoon due to respiratory effort and fatigue 7/4: Patient weaned back down to 2 L nasal cannula, has the same at home as well as CPAP machine.  Reports that he feels back to his baseline. ______________________________________   Admission HPI   Patient admitted on: 10/06/2023  7:17 PM  Patient admitted by: Thersia Roger, D.O. CHIEF COMPLAINT:        Chief Complaint  Patient presents with  . Weakness      Day of admission HPI:   October 06, 2023 9:09 PM    Patient admitted on Home O2? - Yes Patient on home anticoagulant? -  No Patient admitted with Chronic home foley catheter? - No Foley catheter placed or replaced by another service prior to admission? - No   Mental Status on Admission: The patient is Alert and oriented to PERSON The patient is NOT Alert And oriented to TIME The patient is NOT Alert and oriented to LOCATION   This is a 78 y.o. male with a known history of home O2 dependent COPD, HTN, HLD, OSA, lung cancer on immunotherapy, anxiety/depression, CHF, DM2, hypothyroidism, PAD, GERD, OA, afibpresents to the emergency department for evaluation of weakness.  He was brought in by wife who had difficulty getting him inside his home after his immunotherapy due to weakness.  Initially was found by EMS to be  febrile, hypotensive and tachycardic.  Patient will wake up and respond with brief answers to some questions, but quickly falls back asleep.  Cannot provide meaningful history.   In the ED the patient received Rocephin , Azithro.   Medical admission was requested for further workup and management of sepsis 2/2 pneumonia, AKI, macrocytic anemia.   Problem List, Assessment & Plan    ASSESSMENT & PLAN (In order of descending acuity)  Sepsis 2/2 HCAP - Sepsis likely, improving -Patient with acute on chronic hypoxic respiratory failure requiring BiPAP intermittently throughout the day 7/3, up to 100% FiO2 at times. - Significant improvement overnight, patient back down to his usual 2 L of nasal cannula and saturating well in the high 90s - After shared decision making patient wants to be discharged today, discussed strict return precautions with him and calling PCP and hematology/oncology office on Monday morning to get close follow-up appointments for repeat blood work due to downtrending hemoglobin and patient having history of requiring blood transfusion in the past -Continue oral antibiotics at discharge - Of note patient received Opdivo  infusion (7/1) at the cancer center, recent lung biopsies positive for non small cell recurrence - Follow up cultures and tailor antibiotics if needed   AKI - Ruled out/unlikely - Patient with mildly elevated creatinine at 1.34 on admission, trending downward with IV fluids   Macrocytic anemia - Hemoglobin slowly downtrending, patient has required blood transfusion  in the past, suspect this is a multifactorial process due to underlying cancer receiving chemotherapy and acute infection - Recommend patient continue vitamin and iron  supplementation at discharge - Follow-up with hematology/oncology with early appointment to see if you will require blood transfusion in the near future, hemoglobin at discharge 7.7   HTN  - resume clonidine , losartan ,  hydrochlorothiazide    HLD  - resume pravastatin    COPD  - resume Flonase , Incruse - O2 and nebs PRN, patient has both at home   BPH - continue finasteride , tamsulosin    DM2 - Accuchecks achs with ISS - Recommend carb controlled diet  ADDITIONAL PATIENT FINDINGS OR OBSERVATIONS  Alert and oriented 78 year old male sitting up in hospital chair in no acute distress.  2 L of oxygen  in place. Domestic partner present at bedside, after shared decision making patient would like to proceed with discharge home Lungs with improved aeration, still with scattered rhonchi, no wheezes Heart regular rate and rhythm, no murmur Abdomen soft, nontender, nondistended  DVT prophylaxis while in hospital:  enoxaparin    _____________________________________  Vital Signs: BP 107/50   Pulse 97   Temp 36.6 C (97.9 F) (Oral)   Resp 19   Ht 167.6 cm (5' 6)   Wt 67 kg (147 lb 9.6 oz)   SpO2 100%   BMI 23.82 kg/m   Nutrition:                                  Diet Instructions     Discharge diet (specify)     Discharge Nutrition Therapy: Heart Healthy                      CODE STATUS :                    Full Code   Patient discharged on Home O2? - yes Patient discharged on home anticoagulant? -  no Patient discharged with Chronic home foley catheter? - no  Time Spent on Discharge I spent greater than 30 minutes counseling and coordinating care for the discharge of this patient. The patient, domestic partner, and I discussed the importance of outpatient follow-up as well as concerning signs and symptoms that would require immediate evaluation by a medical professional. The aforementioned conversation participants understand  and did show insight. I did use teachback to ensure understanding. The above participant/s is aware that not following the discussed plan, recommendations, and follow up can lead to severe negative effects on the patient's health, up to and including  death.    Discharge Medications     Your Medication List     STOP taking these medications    aspirin -calcium carbonate 81 mg-300 mg calcium(777 mg) Tab   ibuprofen  600 MG tablet Commonly known as: MOTRIN    linagliptin  5 mg Tab Commonly known as: TRADJENTA    TRADJENTA  5 mg Tab Generic drug: linagliptin        START taking these medications    amoxicillin -clavulanate 875-125 mg per tablet Commonly known as: AUGMENTIN  Take 1 tablet by mouth two (2) times a day for 7 days.   doxycycline  100 MG tablet Commonly known as: ADOXA Take 1 tablet (100 mg total) by mouth two (2) times a day for 7 days.       CHANGE how you take these medications    ADVAIR DISKUS 100-50 mcg/dose diskus Generic drug: fluticasone  propion-salmeterol Inhale 1 puff. What  changed: Another medication with the same name was removed. Continue taking this medication, and follow the directions you see here.   gabapentin  300 MG capsule Commonly known as: NEURONTIN  TAKE 1 CAPSULE (300 MG TOTAL) BY MOUTH 3 (THREE) TIMES A DAY. What changed: Another medication with the same name was removed. Continue taking this medication, and follow the directions you see here.       CONTINUE taking these medications    albuterol  90 mcg/actuation inhaler Commonly known as: PROVENTIL  HFA;VENTOLIN  HFA Inhale 2 puffs.   cloNIDine  HCL 0.1 MG tablet Commonly known as: CATAPRES  Take 1 tablet (0.1 mg total) by mouth Two (2) times a day.   durvalumab  10 mg/kg, OVERFILL 7 mL in sodium chloride  0.9% 50 mL Infuse into a venous catheter.   fentaNYL  12 mcg/hr patch Commonly known as: DURAGESIC  Place 12.5 mcg on the skin every third day.   finasteride  5 mg tablet Commonly known as: PROSCAR  Take 1 tablet (5 mg total) by mouth in the morning.   fluticasone  propionate 50 mcg/actuation nasal spray Commonly known as: FLONASE  2 sprays into each nostril once as needed.   INCRUSE ELLIPTA  62.5 mcg/actuation  inhaler Generic drug: umeclidinium Inhale 1 puff.   LORazepam  0.5 MG tablet Commonly known as: ATIVAN  once as needed.   losartan -hydroCHLOROthiazide  100-25 mg per tablet Commonly known as: HYZAAR in the morning.   pravastatin  40 MG tablet Commonly known as: PRAVACHOL  Take 1 tablet (40 mg total) by mouth every three (3) days.   tamsulosin  0.4 mg capsule Commonly known as: FLOMAX  Take 1 capsule (0.4 mg total) by mouth in the morning.   traZODone  50 MG tablet Commonly known as: DESYREL  Take 1 tablet (50 mg total) by mouth once as needed.       ____________________________________________  Discharge Instructions   Nutrition:                                  Diet Instructions     Discharge diet (specify)     Discharge Nutrition Therapy: Heart Healthy       Activity:                                   Activity Instructions     Activity as tolerated         Appointments:                          Follow Up:                              Follow Up instructions and Outpatient Referrals    Ambulatory Referral to Home Health     Reason for referral: Pneumonia; Lung Cancer receiving Chemo   Disciplines requested:  Nursing Physical Therapy     Nursing requested: Teaching/skilled observation and assessment   What teaching is needed (new diagnosis? new medications?): Medical  Management   Physical Therapy requested:  Home safety evaluation Ambulation training Transfer training Evaluate and treat Strengthening exercises     Physician to follow patient's care: PCP Comment - [facility] [address]  [place]   Ambulatory Referral to Palliative Care     Reason for referral: Cancer of upper lobe of right lung; COPD severe   Is this a palliative referral for:  symptom management coping support     Call MD for:  difficulty breathing, headache or visual disturbances     Call MD for:  extreme fatigue     Call MD for:  persistent dizziness or light-headedness     Call MD for:   persistent nausea or vomiting     Call MD for: Temperature > 38.5 Celsius ( > 101.3 Fahrenheit)     Discharge instructions         Allergies  Allergies[1]    Past Medical History  Past Medical History[2]     Lab Results   Recent Labs    Units 10/06/23 1947 10/07/23 0559 10/08/23 0551 10/09/23 0447  WBC 10*9/L 20.9* 25.6* 18.1* 18.2*  HGB g/dL 9.1* 9.7* 8.4* 7.7*  HCT % 29.0* 31.0* 27.9* 24.9*  PLT 10*9/L 587* 628* 529* 505*   Recent Labs    Units 10/06/23 1947 10/07/23 0559 10/08/23 0551 10/09/23 0447  NA mmol/L 139 138 139 140  K mmol/L 4.0 3.8 4.0 4.0  CL mmol/L 101 100 101 102  CO2 mmol/L 31.6 30.9 30.5 34.2*  BUN mg/dL 29* 24* 19 23*  CREATININE mg/dL 8.65* 8.79 8.91 8.86  GLU mg/dL 873 887 867 840  CALCIUM mg/dL 8.9 9.4 9.2 9.3  ALBUMIN g/dL 2.1*  --   --   --   PROT g/dL 7.6  --   --   --   BILITOT mg/dL 0.2*  --   --   --   AST U/L 15  --   --   --   ALT U/L 13  --   --   --   ALKPHOS U/L 116  --   --   --   LACTATE mmol/L 1.0  --   --   --    Recent Labs    Units 10/06/23 1947  TROPONINI ng/L 34   Recent Labs    Units 10/06/23 1947  INR  1.21   Recent Labs    Units 10/07/23 0639  VITAMINB12 pg/ml 666  FOLATE ng/mL 6.8  IRON  ug/dL 18*  LABIRON % 7  TIBC ug/dL 746    No results for input(s): HEPAIGM, HEPBSAG, HEPBIGM, HEPCAB, MITOAB in the last 168 hours. Recent Labs    Units 10/06/23 2116  WBCUA /HPF <1  NITRITE  Negative  LEUKOCYTESUR  Negative  BACTERIA /HPF None Seen  RBCUA /HPF <1  BLOODU  Negative  GLUCOSEU  Negative  PROTEINUA  Negative  KETONESU  Negative   No results for input(s): KETUR, PREGTESTUR, PREGPOC, NAURINE, LABOSMO, OSMOFT, PROTEINUR, CREATUR, PCRATIOUR, OPIAU, BENZU, TRICYCLIC, PCPU, AMPHU, COCAU, CANNAU, BARBU, URPROTELEC in the last 168 hours. No results for input(s): FTYP1, WBCFLUID, FNEUT, LYMPHSFL, FMONO, EOSFL, RBCFL, CLARITYFLUID,  COLORFL in the last 168 hours. Recent Labs    10/06/23 1936  PHART 7.43  PCO2ART 43.2  PO2ART 66*  HCO3ART 28.7*  O2SATART 94.4*  BEART 4.0*    Imaging  Echocardiogram W Colorflow Spectral Doppler With Contrast Result Date: 10/07/2023 Patient Info Name:     ZYAN COBY Age:     63 years DOB:     [birth year] Gender:     Male MRN:     [id number] Accession #:     [id number] [facility] Account #:     [id number] Ht:     168 cm Wt:     67 kg BSA:     1.77 m2 BP:     113 /     59 mmHg HR:  102 bpm Exam Date:     10/07/2023 8:04 AM Admit Date:     10/06/2023 Exam Type:     ECHOCARDIOGRAM W COLORFLOW SPECTRAL DOPPLER W CONTRAST Technical Quality:     Poor Reason for Poor Study:     poor echocardiographic windows Staff Sonographer:     Mliss Gate Supervising Physician:     Marinell Fairy How MD Ordering Physician:     Joana Chamber Panwala Study Info Indications      - Sepsis ; Definity /Optison  Procedure(s)   Complete two-dimensional, color flow and Doppler transthoracic echocardiogram is performed with contrast to opacify the left ventricle and to improve the delineation of the left ventricle endocardial borders. Summary   1. Technically difficult study.   2. The left ventricle is normal in size with normal wall thickness.   3. The left ventricular systolic function is normal, LVEF is visually estimated at > 55%.   4. The mitral valve leaflets are moderately thickened with mildly reduced leaflet mobility.   5. Mitral annular calcification is present.   6. There is mild mitral stenosis.   7. The left atrium is mildly dilated in size.   8. The right ventricle is normal in size, with normal systolic function. Left Ventricle   The left ventricle is normal in size with normal wall thickness. The left ventricular systolic function is normal, LVEF is visually estimated at > 55%. Left ventricular diastolic function cannot be accurately assessed. Right Ventricle   The right ventricle is normal in size, with normal  systolic function. Left Atrium   The left atrium is mildly dilated in size. Right Atrium   The right atrium is normal in size. Aortic Valve   There is no significant aortic regurgitation. There is no evidence of a significant transvalvular gradient. Suboptimally imaged aortic valve appears thickened. Mitral Valve   The mitral valve leaflets are moderately thickened with mildly reduced leaflet mobility. Mitral annular calcification is present. There is trivial mitral valve regurgitation. There is mild mitral stenosis. Mean diastolic gradient: 5 mmHg at a heart rate of 102 bpm. Tricuspid Valve   The tricuspid valve leaflets are normal, with normal leaflet mobility. There is trivial tricuspid regurgitation. There is no pulmonary hypertension. TR maximum velocity: 2.3 m/s  Estimated PASP: 24 mmHg. Pulmonic Valve   Pulmonary valve is not well visualized. There is no significant pulmonic regurgitation. There is no evidence of a significant transvalvular gradient. Aorta   The aorta is normal in size in the visualized segments. Inferior Vena Cava   IVC size and inspiratory change suggest normal right atrial pressure. (0-5 mmHg). Pericardium/Pleural   There is no pericardial effusion. Ventricles ---------------------------------------------------------------------- Name                                 Value        Normal ---------------------------------------------------------------------- LV Dimensions 2D/MM ----------------------------------------------------------------------  IVS Diastolic Thickness (2D)                                0.6 cm       0.6-1.0 LVID Diastole (2D)                  4.2 cm       4.2-5.8  LVPW Diastolic Thickness (2D)  0.6 cm       0.6-1.0 LVID Systole (2D)                   3.3 cm       2.5-4.0 LVOT Diameter                       1.7 cm               LV Mass Index (2D Cubed)           40 g/m2        49-115  Relative Wall Thickness (2D)                                   0.29        <=0.42 LV Function ---------------------------------------------------------------------- LV EF (4C MOD)                        65 %                LV Diastolic Volume Index (BP MOD)                        35.4 ml/m2     34.0-74.0 LV EF (BP MOD)                        77 %         52-72 RV Dimensions 2D/MM ----------------------------------------------------------------------  RV Basal Diastolic Dimension                           3.0 cm       2.5-4.1 Atria ---------------------------------------------------------------------- Name                                 Value        Normal ---------------------------------------------------------------------- LA Dimensions ---------------------------------------------------------------------- LA Dimension (2D)                   4.3 cm       3.0-4.1 LA Volume Index (4C A-L)        25.95 ml/m2               LA Volume Index (2C A-L)        15.73 ml/m2               RA Dimensions ---------------------------------------------------------------------- RA Area (4C)                      14.2 cm2        <=18.0 RA Area (4C) Index              8.0 cm2/m2               RA ESV Index (4C MOD)             21 ml/m2         18-32 Left Ventricular Outflow Tract ---------------------------------------------------------------------- Name                                 Value        Normal ---------------------------------------------------------------------- LVOT 2D ----------------------------------------------------------------------  LVOT Diameter                       1.7 cm               LVOT Area                          2.3 cm2               LVOT Doppler ---------------------------------------------------------------------- LVOT Peak Velocity                 0.7 m/s               LVOT VTI                             10 cm               LVOT Stroke Volume                   22 ml               LVOT SI                           13 ml/m2 Aortic Valve  ---------------------------------------------------------------------- Name                                 Value        Normal ---------------------------------------------------------------------- AV Doppler ---------------------------------------------------------------------- AV Peak Velocity                   1.4 m/s               AV Peak Gradient                    8 mmHg               AV Mean Gradient                    4 mmHg               AV VTI                               24 cm               AV Area (Cont Eq VTI)              1.0 cm2         >=3.0 AV Area Index (Cont Eq VTI)     0.5 cm2/m2               AV Area (Cont Eq Vel)              1.1 cm2               AV Area Index (Cont Eq Vel)     0.6 cm2/m2               AV DI (Vel)                           0.50  AV DI (VTI)                           0.42 Mitral Valve ---------------------------------------------------------------------- Name                                 Value        Normal ---------------------------------------------------------------------- MV Doppler ---------------------------------------------------------------------- MV Mean Gradient                    5 mmHg               MV Diastolic Function ---------------------------------------------------------------------- MV E Peak Velocity                127 cm/s               MV A Peak Velocity                138 cm/s               MV E/A                                 0.9               MV Annular TDI ---------------------------------------------------------------------- MV Septal e' Velocity             9.0 cm/s         >=8.0 MV E/e' (Septal)                      14.1               MV Lateral e' Velocity            6.7 cm/s        >=10.0 MV E/e' (Lateral)                     18.8               MV e' Average                     7.9 cm/s               MV E/e' (Average)                     16.5 Tricuspid Valve  ---------------------------------------------------------------------- Name                                 Value        Normal ---------------------------------------------------------------------- TV Regurgitation Doppler ---------------------------------------------------------------------- TR Peak Velocity                   2.3 m/s               Estimated PAP/RSVP ---------------------------------------------------------------------- RA Pressure                         3 mmHg           <=5 RV Systolic Pressure               24 mmHg           <36 Aorta ---------------------------------------------------------------------- Name  Value        Normal ---------------------------------------------------------------------- Ascending Aorta ---------------------------------------------------------------------- Ao Root Diameter (2D)               2.0 cm               Ao Root Diam Index (2D)          1.1 cm/m2               Ascending Aorta Diameter            3.3 cm Venous ---------------------------------------------------------------------- Name                                 Value        Normal ---------------------------------------------------------------------- IVC/SVC ---------------------------------------------------------------------- IVC Diameter (Insp 2D)              0.6 cm               IVC Diameter (Exp 2D)               1.5 cm         <=2.1  IVC Diameter Percent Change (2D)                                  60 %          >=50 Report Signatures Finalized by Marinell Fairy How  MD on 10/07/2023 12:11 PM  XR Chest Portable Result Date: 10/06/2023 Exam:  Portable Chest  History:  78 year old with chest pain.  Technique:  Single frontal view.  Comparison:  08/05/2023  Findings:   There is no significant interval change. Confluent opacity in the left upper lobe near the apex is again noted measuring up to 5 cm. There is a suprahilar opacity in the left lung also noted appearing  slightly more dense than on the prior exam. There is confluent opacity in the medial right upper lobe also noted similar to the prior exam. The heart and mediastinum are stable. No pneumothorax or pleural fluid. Left-sided Port-A-Cath is unchanged. Bony structures are stable.    1.    There is no significant interval change in the confluent dominant mass in the left lung apex and medial right upper lobe confluent mass. 2.    There is a suprahilar opacity in the left lung which appears slightly more dense than on the prior exam. Superimpose infection or interval increase in mass is not excluded. Increase in focal scarring is also a consideration. This could be better characterize with chest CT if clinically warranted.  Signed (Electronic Signature): 10/06/2023 8:15 PM Signed By: Ozell JONETTA Quale, MD   Margart Dragon, DO Hospitalist, [facility] 10/09/23, 11:08 AM      [1] Allergies Allergen Reactions  . Bisoprolol  Other (See Comments)    Confusion, delirium  . Sulfa  (Sulfonamide Antibiotics) Swelling    Mouth swelling  . Atorvastatin Other (See Comments)    Muscle aches - tolerating Pravastatin  40 mg MWF  . Empagliflozin  Other (See Comments)    FEELS SLUGGISH, TIRED  . Metoprolol  Other (See Comments)    Fatigue  . Rosuvastatin Other (See Comments)    Muscle aches - tolerating Pravastatin  40 mg MWF  . Doxycycline  Nausea Only  . Temazepam  Other (See Comments)    Made insomnia worse  . Tramadol  Itching  [2] Past Medical History: Diagnosis Date  .  Atrial fibrillation      . Cancer      . CHF (congestive heart failure)      . COPD (chronic obstructive pulmonary disease)      . Diabetes mellitus

## 2023-10-09 NOTE — Care Plan (Signed)
  Care Management Final Transition Planning Assessment    10/09/2023  Patient's Post Acute Contact Information: see demo     Has a PCP appointment been made?: No   Has a specialist appointment been made?: No   Post Acute Facility needed at discharge?: No        Home Care/ Home Medical Equipment needed at discharge?: Yes Home Care/ Home Medical Equipment: Home Health (specify), HME (Home Medical Supplies) (specify)  Home Health Provider (Name/Phone #): [facility]  HME Agency (Name/Phone #): [facility]   Outpatient/Community Referrals needed for discharge?: Yes  Outpatient/Community Resources: Other, Infusion therapy -Outpatient  Agency detail (Name/Phone #): [facility] Transportation Anticipated: family or friend will provide    Currently receiving outpatient dialysis?: No       Discharge Disposition: Home w/ Home Health     IMM Delivery Follow Up Important Message (IM) letter given to patient and/or family?: Yes IMM Delivered by: The IMM was delivered and signed by authorized representative. IMM Delivery Date: 10/09/23 IMM Delivery Time: 1051 The beneficiary verbalized understanding of the rights of the hospitalized patient and the right to appeal the discharge decision?: Yes The patient/authorized representative was advised they have 4 hours to consider their right to request a QIO review prior to leaving the hospital, if the physician orders discharge today.: Yes The patient/authorized representative verbalized understanding of this right.: Yes IMM Delivery Comments: Reminded of discharge rights.  Quality data for continuing care services shared with patient and/or representative?: Yes Patient and/or family were provided with choice of facilities / services that are available and appropriate to meet post hospital care needs?: N/A  List choices in order highest to lowest preferred, if applicable. : Patient already established with [facility]      Final Assessment Complete: Yes                     Readmission Risk Score:  Predictive Model Details        25% (High)  Factor Value   Calculated 10/09/2023 08:06 25% Number of active inpatient medication orders 41   [facility] Risk of Unplanned Readmission Model 10% Number of ED visits in last six months 2    9% Diagnosis of cancer present    8% ECG/EKG order present in last 6 months    7% Latest BUN high (23 mg/dL)    7% Diagnosis of electrolyte disorder present    6% Imaging order present in last 6 months    5% Age 53    5% Latest hemoglobin low (7.7 g/dL)    5% Charlson Comorbidity Index 5    4% Number of hospitalizations in last year 1    4% Diagnosis of deficiency anemia present    4% Active anticoagulant inpatient medication order present    2% Current length of stay 2.452 days

## 2023-10-10 ENCOUNTER — Inpatient Hospital Stay (HOSPITAL_COMMUNITY)

## 2023-10-10 ENCOUNTER — Emergency Department (HOSPITAL_COMMUNITY)

## 2023-10-10 ENCOUNTER — Inpatient Hospital Stay (HOSPITAL_COMMUNITY)
Admission: EM | Admit: 2023-10-10 | Discharge: 2023-11-06 | DRG: 870 | Disposition: E | Attending: Internal Medicine | Admitting: Internal Medicine

## 2023-10-10 ENCOUNTER — Other Ambulatory Visit: Payer: Self-pay

## 2023-10-10 DIAGNOSIS — I639 Cerebral infarction, unspecified: Secondary | ICD-10-CM | POA: Diagnosis not present

## 2023-10-10 DIAGNOSIS — I6340 Cerebral infarction due to embolism of unspecified cerebral artery: Secondary | ICD-10-CM | POA: Diagnosis not present

## 2023-10-10 DIAGNOSIS — R627 Adult failure to thrive: Secondary | ICD-10-CM | POA: Diagnosis present

## 2023-10-10 DIAGNOSIS — A419 Sepsis, unspecified organism: Principal | ICD-10-CM | POA: Diagnosis present

## 2023-10-10 DIAGNOSIS — E039 Hypothyroidism, unspecified: Secondary | ICD-10-CM | POA: Diagnosis not present

## 2023-10-10 DIAGNOSIS — J9621 Acute and chronic respiratory failure with hypoxia: Secondary | ICD-10-CM | POA: Diagnosis present

## 2023-10-10 DIAGNOSIS — R591 Generalized enlarged lymph nodes: Secondary | ICD-10-CM | POA: Diagnosis present

## 2023-10-10 DIAGNOSIS — I959 Hypotension, unspecified: Secondary | ICD-10-CM | POA: Diagnosis not present

## 2023-10-10 DIAGNOSIS — I700 Atherosclerosis of aorta: Secondary | ICD-10-CM | POA: Diagnosis present

## 2023-10-10 DIAGNOSIS — I6523 Occlusion and stenosis of bilateral carotid arteries: Secondary | ICD-10-CM | POA: Diagnosis not present

## 2023-10-10 DIAGNOSIS — R011 Cardiac murmur, unspecified: Secondary | ICD-10-CM | POA: Diagnosis not present

## 2023-10-10 DIAGNOSIS — Z8679 Personal history of other diseases of the circulatory system: Secondary | ICD-10-CM | POA: Diagnosis not present

## 2023-10-10 DIAGNOSIS — I6782 Cerebral ischemia: Secondary | ICD-10-CM | POA: Diagnosis not present

## 2023-10-10 DIAGNOSIS — D630 Anemia in neoplastic disease: Secondary | ICD-10-CM | POA: Diagnosis present

## 2023-10-10 DIAGNOSIS — R54 Age-related physical debility: Secondary | ICD-10-CM | POA: Diagnosis present

## 2023-10-10 DIAGNOSIS — M542 Cervicalgia: Secondary | ICD-10-CM | POA: Diagnosis not present

## 2023-10-10 DIAGNOSIS — Z85118 Personal history of other malignant neoplasm of bronchus and lung: Secondary | ICD-10-CM | POA: Diagnosis not present

## 2023-10-10 DIAGNOSIS — J9600 Acute respiratory failure, unspecified whether with hypoxia or hypercapnia: Secondary | ICD-10-CM | POA: Diagnosis present

## 2023-10-10 DIAGNOSIS — W19XXXA Unspecified fall, initial encounter: Secondary | ICD-10-CM | POA: Diagnosis present

## 2023-10-10 DIAGNOSIS — J9690 Respiratory failure, unspecified, unspecified whether with hypoxia or hypercapnia: Secondary | ICD-10-CM | POA: Diagnosis present

## 2023-10-10 DIAGNOSIS — J440 Chronic obstructive pulmonary disease with acute lower respiratory infection: Secondary | ICD-10-CM | POA: Diagnosis present

## 2023-10-10 DIAGNOSIS — I2510 Atherosclerotic heart disease of native coronary artery without angina pectoris: Secondary | ICD-10-CM | POA: Diagnosis present

## 2023-10-10 DIAGNOSIS — E440 Moderate protein-calorie malnutrition: Secondary | ICD-10-CM | POA: Diagnosis not present

## 2023-10-10 DIAGNOSIS — Y95 Nosocomial condition: Secondary | ICD-10-CM | POA: Diagnosis present

## 2023-10-10 DIAGNOSIS — J441 Chronic obstructive pulmonary disease with (acute) exacerbation: Secondary | ICD-10-CM | POA: Diagnosis not present

## 2023-10-10 DIAGNOSIS — I6200 Nontraumatic subdural hemorrhage, unspecified: Secondary | ICD-10-CM | POA: Diagnosis not present

## 2023-10-10 DIAGNOSIS — R000 Tachycardia, unspecified: Secondary | ICD-10-CM | POA: Diagnosis not present

## 2023-10-10 DIAGNOSIS — Z9079 Acquired absence of other genital organ(s): Secondary | ICD-10-CM

## 2023-10-10 DIAGNOSIS — R6520 Severe sepsis without septic shock: Secondary | ICD-10-CM | POA: Diagnosis present

## 2023-10-10 DIAGNOSIS — N179 Acute kidney failure, unspecified: Secondary | ICD-10-CM | POA: Diagnosis not present

## 2023-10-10 DIAGNOSIS — I110 Hypertensive heart disease with heart failure: Secondary | ICD-10-CM | POA: Diagnosis not present

## 2023-10-10 DIAGNOSIS — Z95820 Peripheral vascular angioplasty status with implants and grafts: Secondary | ICD-10-CM

## 2023-10-10 DIAGNOSIS — Z79890 Hormone replacement therapy (postmenopausal): Secondary | ICD-10-CM

## 2023-10-10 DIAGNOSIS — Z4682 Encounter for fitting and adjustment of non-vascular catheter: Secondary | ICD-10-CM | POA: Diagnosis not present

## 2023-10-10 DIAGNOSIS — J9691 Respiratory failure, unspecified with hypoxia: Secondary | ICD-10-CM | POA: Diagnosis present

## 2023-10-10 DIAGNOSIS — C3431 Malignant neoplasm of lower lobe, right bronchus or lung: Secondary | ICD-10-CM | POA: Diagnosis not present

## 2023-10-10 DIAGNOSIS — C3432 Malignant neoplasm of lower lobe, left bronchus or lung: Secondary | ICD-10-CM | POA: Diagnosis present

## 2023-10-10 DIAGNOSIS — Z8249 Family history of ischemic heart disease and other diseases of the circulatory system: Secondary | ICD-10-CM

## 2023-10-10 DIAGNOSIS — D638 Anemia in other chronic diseases classified elsewhere: Secondary | ICD-10-CM

## 2023-10-10 DIAGNOSIS — I3139 Other pericardial effusion (noninflammatory): Secondary | ICD-10-CM | POA: Diagnosis not present

## 2023-10-10 DIAGNOSIS — J189 Pneumonia, unspecified organism: Principal | ICD-10-CM | POA: Diagnosis present

## 2023-10-10 DIAGNOSIS — C3412 Malignant neoplasm of upper lobe, left bronchus or lung: Secondary | ICD-10-CM | POA: Diagnosis present

## 2023-10-10 DIAGNOSIS — Z7401 Bed confinement status: Secondary | ICD-10-CM | POA: Diagnosis not present

## 2023-10-10 DIAGNOSIS — Z7951 Long term (current) use of inhaled steroids: Secondary | ICD-10-CM

## 2023-10-10 DIAGNOSIS — I499 Cardiac arrhythmia, unspecified: Secondary | ICD-10-CM | POA: Diagnosis not present

## 2023-10-10 DIAGNOSIS — E785 Hyperlipidemia, unspecified: Secondary | ICD-10-CM | POA: Diagnosis present

## 2023-10-10 DIAGNOSIS — G9341 Metabolic encephalopathy: Secondary | ICD-10-CM | POA: Diagnosis not present

## 2023-10-10 DIAGNOSIS — I6521 Occlusion and stenosis of right carotid artery: Secondary | ICD-10-CM | POA: Diagnosis present

## 2023-10-10 DIAGNOSIS — I5033 Acute on chronic diastolic (congestive) heart failure: Secondary | ICD-10-CM | POA: Diagnosis not present

## 2023-10-10 DIAGNOSIS — E1140 Type 2 diabetes mellitus with diabetic neuropathy, unspecified: Secondary | ICD-10-CM | POA: Diagnosis present

## 2023-10-10 DIAGNOSIS — R29713 NIHSS score 13: Secondary | ICD-10-CM | POA: Diagnosis not present

## 2023-10-10 DIAGNOSIS — I5030 Unspecified diastolic (congestive) heart failure: Secondary | ICD-10-CM | POA: Diagnosis not present

## 2023-10-10 DIAGNOSIS — I4719 Other supraventricular tachycardia: Secondary | ICD-10-CM | POA: Diagnosis present

## 2023-10-10 DIAGNOSIS — Z9981 Dependence on supplemental oxygen: Secondary | ICD-10-CM

## 2023-10-10 DIAGNOSIS — Z452 Encounter for adjustment and management of vascular access device: Secondary | ICD-10-CM | POA: Diagnosis not present

## 2023-10-10 DIAGNOSIS — T380X5A Adverse effect of glucocorticoids and synthetic analogues, initial encounter: Secondary | ICD-10-CM | POA: Diagnosis present

## 2023-10-10 DIAGNOSIS — J90 Pleural effusion, not elsewhere classified: Secondary | ICD-10-CM | POA: Diagnosis not present

## 2023-10-10 DIAGNOSIS — J9601 Acute respiratory failure with hypoxia: Secondary | ICD-10-CM | POA: Diagnosis present

## 2023-10-10 DIAGNOSIS — R918 Other nonspecific abnormal finding of lung field: Secondary | ICD-10-CM | POA: Diagnosis not present

## 2023-10-10 DIAGNOSIS — M25552 Pain in left hip: Secondary | ICD-10-CM | POA: Diagnosis not present

## 2023-10-10 DIAGNOSIS — Z515 Encounter for palliative care: Secondary | ICD-10-CM | POA: Diagnosis not present

## 2023-10-10 DIAGNOSIS — Z8673 Personal history of transient ischemic attack (TIA), and cerebral infarction without residual deficits: Secondary | ICD-10-CM

## 2023-10-10 DIAGNOSIS — I6502 Occlusion and stenosis of left vertebral artery: Secondary | ICD-10-CM | POA: Diagnosis not present

## 2023-10-10 DIAGNOSIS — Z7902 Long term (current) use of antithrombotics/antiplatelets: Secondary | ICD-10-CM

## 2023-10-10 DIAGNOSIS — R569 Unspecified convulsions: Secondary | ICD-10-CM | POA: Diagnosis not present

## 2023-10-10 DIAGNOSIS — C3411 Malignant neoplasm of upper lobe, right bronchus or lung: Secondary | ICD-10-CM | POA: Diagnosis not present

## 2023-10-10 DIAGNOSIS — M79631 Pain in right forearm: Secondary | ICD-10-CM | POA: Diagnosis not present

## 2023-10-10 DIAGNOSIS — C3492 Malignant neoplasm of unspecified part of left bronchus or lung: Secondary | ICD-10-CM | POA: Diagnosis not present

## 2023-10-10 DIAGNOSIS — Z79899 Other long term (current) drug therapy: Secondary | ICD-10-CM

## 2023-10-10 DIAGNOSIS — K219 Gastro-esophageal reflux disease without esophagitis: Secondary | ICD-10-CM | POA: Diagnosis present

## 2023-10-10 DIAGNOSIS — R0602 Shortness of breath: Secondary | ICD-10-CM | POA: Diagnosis not present

## 2023-10-10 DIAGNOSIS — Z66 Do not resuscitate: Secondary | ICD-10-CM | POA: Diagnosis not present

## 2023-10-10 DIAGNOSIS — I10 Essential (primary) hypertension: Secondary | ICD-10-CM | POA: Diagnosis not present

## 2023-10-10 DIAGNOSIS — Z781 Physical restraint status: Secondary | ICD-10-CM

## 2023-10-10 DIAGNOSIS — I350 Nonrheumatic aortic (valve) stenosis: Secondary | ICD-10-CM | POA: Diagnosis present

## 2023-10-10 DIAGNOSIS — D539 Nutritional anemia, unspecified: Secondary | ICD-10-CM | POA: Diagnosis present

## 2023-10-10 DIAGNOSIS — M25551 Pain in right hip: Secondary | ICD-10-CM | POA: Diagnosis not present

## 2023-10-10 DIAGNOSIS — Z7189 Other specified counseling: Secondary | ICD-10-CM | POA: Diagnosis not present

## 2023-10-10 DIAGNOSIS — Z6826 Body mass index (BMI) 26.0-26.9, adult: Secondary | ICD-10-CM

## 2023-10-10 DIAGNOSIS — N400 Enlarged prostate without lower urinary tract symptoms: Secondary | ICD-10-CM | POA: Diagnosis present

## 2023-10-10 DIAGNOSIS — R140 Abdominal distension (gaseous): Secondary | ICD-10-CM | POA: Diagnosis not present

## 2023-10-10 DIAGNOSIS — Z794 Long term (current) use of insulin: Secondary | ICD-10-CM

## 2023-10-10 DIAGNOSIS — I5032 Chronic diastolic (congestive) heart failure: Secondary | ICD-10-CM | POA: Diagnosis not present

## 2023-10-10 DIAGNOSIS — Z885 Allergy status to narcotic agent status: Secondary | ICD-10-CM

## 2023-10-10 DIAGNOSIS — G253 Myoclonus: Secondary | ICD-10-CM | POA: Diagnosis not present

## 2023-10-10 DIAGNOSIS — F17210 Nicotine dependence, cigarettes, uncomplicated: Secondary | ICD-10-CM | POA: Diagnosis present

## 2023-10-10 DIAGNOSIS — I4892 Unspecified atrial flutter: Secondary | ICD-10-CM | POA: Diagnosis not present

## 2023-10-10 DIAGNOSIS — J9622 Acute and chronic respiratory failure with hypercapnia: Secondary | ICD-10-CM | POA: Diagnosis present

## 2023-10-10 DIAGNOSIS — Z9221 Personal history of antineoplastic chemotherapy: Secondary | ICD-10-CM

## 2023-10-10 DIAGNOSIS — Z555 Less than a high school diploma: Secondary | ICD-10-CM

## 2023-10-10 DIAGNOSIS — R059 Cough, unspecified: Secondary | ICD-10-CM | POA: Diagnosis not present

## 2023-10-10 DIAGNOSIS — Z833 Family history of diabetes mellitus: Secondary | ICD-10-CM

## 2023-10-10 DIAGNOSIS — Z882 Allergy status to sulfonamides status: Secondary | ICD-10-CM

## 2023-10-10 DIAGNOSIS — E1165 Type 2 diabetes mellitus with hyperglycemia: Secondary | ICD-10-CM | POA: Diagnosis not present

## 2023-10-10 DIAGNOSIS — R519 Headache, unspecified: Secondary | ICD-10-CM | POA: Diagnosis not present

## 2023-10-10 DIAGNOSIS — E1151 Type 2 diabetes mellitus with diabetic peripheral angiopathy without gangrene: Secondary | ICD-10-CM | POA: Diagnosis present

## 2023-10-10 DIAGNOSIS — F05 Delirium due to known physiological condition: Secondary | ICD-10-CM | POA: Diagnosis not present

## 2023-10-10 DIAGNOSIS — R6889 Other general symptoms and signs: Secondary | ICD-10-CM | POA: Diagnosis not present

## 2023-10-10 DIAGNOSIS — Z743 Need for continuous supervision: Secondary | ICD-10-CM | POA: Diagnosis not present

## 2023-10-10 DIAGNOSIS — Z823 Family history of stroke: Secondary | ICD-10-CM

## 2023-10-10 DIAGNOSIS — C3491 Malignant neoplasm of unspecified part of right bronchus or lung: Secondary | ICD-10-CM | POA: Diagnosis not present

## 2023-10-10 DIAGNOSIS — J9620 Acute and chronic respiratory failure, unspecified whether with hypoxia or hypercapnia: Secondary | ICD-10-CM | POA: Diagnosis not present

## 2023-10-10 DIAGNOSIS — C3490 Malignant neoplasm of unspecified part of unspecified bronchus or lung: Secondary | ICD-10-CM | POA: Diagnosis not present

## 2023-10-10 DIAGNOSIS — R531 Weakness: Secondary | ICD-10-CM | POA: Diagnosis not present

## 2023-10-10 DIAGNOSIS — Z923 Personal history of irradiation: Secondary | ICD-10-CM

## 2023-10-10 LAB — URINALYSIS, W/ REFLEX TO CULTURE (INFECTION SUSPECTED)
Bacteria, UA: NONE SEEN
Bilirubin Urine: NEGATIVE
Glucose, UA: NEGATIVE mg/dL
Ketones, ur: NEGATIVE mg/dL
Leukocytes,Ua: NEGATIVE
Nitrite: NEGATIVE
Protein, ur: 30 mg/dL — AB
RBC / HPF: >50 RBC/hpf (ref 0–5)
Specific Gravity, Urine: 1.027 (ref 1.005–1.030)
pH: 5 (ref 5.0–8.0)

## 2023-10-10 LAB — RESP PANEL BY RT-PCR (RSV, FLU A&B, COVID)  RVPGX2
Influenza A by PCR: NEGATIVE
Influenza B by PCR: NEGATIVE
Resp Syncytial Virus by PCR: NEGATIVE
SARS Coronavirus 2 by RT PCR: NEGATIVE

## 2023-10-10 LAB — COMPREHENSIVE METABOLIC PANEL WITH GFR
ALT: 11 U/L (ref 0–44)
AST: 13 U/L — ABNORMAL LOW (ref 15–41)
Albumin: 2.4 g/dL — ABNORMAL LOW (ref 3.5–5.0)
Alkaline Phosphatase: 97 U/L (ref 38–126)
Anion gap: 11 (ref 5–15)
BUN: 29 mg/dL — ABNORMAL HIGH (ref 8–23)
CO2: 27 mmol/L (ref 22–32)
Calcium: 9.5 mg/dL (ref 8.9–10.3)
Chloride: 99 mmol/L (ref 98–111)
Creatinine, Ser: 1.6 mg/dL — ABNORMAL HIGH (ref 0.61–1.24)
GFR, Estimated: 44 mL/min — ABNORMAL LOW (ref >60)
Glucose, Bld: 161 mg/dL — ABNORMAL HIGH (ref 70–99)
Potassium: 3.5 mmol/L (ref 3.5–5.1)
Sodium: 137 mmol/L (ref 135–145)
Total Bilirubin: 0.4 mg/dL (ref 0.0–1.2)
Total Protein: 7.7 g/dL (ref 6.5–8.1)

## 2023-10-10 LAB — CBC WITH DIFFERENTIAL/PLATELET
Abs Immature Granulocytes: 0.18 K/uL — ABNORMAL HIGH (ref 0.00–0.07)
Basophils Absolute: 0.1 K/uL (ref 0.0–0.1)
Basophils Relative: 1 %
Eosinophils Absolute: 0.3 K/uL (ref 0.0–0.5)
Eosinophils Relative: 2 %
HCT: 31.7 % — ABNORMAL LOW (ref 39.0–52.0)
Hemoglobin: 9.6 g/dL — ABNORMAL LOW (ref 13.0–17.0)
Immature Granulocytes: 1 %
Lymphocytes Relative: 6 %
Lymphs Abs: 1 K/uL (ref 0.7–4.0)
MCH: 32.5 pg (ref 26.0–34.0)
MCHC: 30.3 g/dL (ref 30.0–36.0)
MCV: 107.5 fL — ABNORMAL HIGH (ref 80.0–100.0)
Monocytes Absolute: 1.6 K/uL — ABNORMAL HIGH (ref 0.1–1.0)
Monocytes Relative: 9 %
Neutro Abs: 14.6 K/uL — ABNORMAL HIGH (ref 1.7–7.7)
Neutrophils Relative %: 81 %
Platelets: 546 K/uL — ABNORMAL HIGH (ref 150–400)
RBC: 2.95 MIL/uL — ABNORMAL LOW (ref 4.22–5.81)
RDW: 18.1 % — ABNORMAL HIGH (ref 11.5–15.5)
WBC: 17.7 K/uL — ABNORMAL HIGH (ref 4.0–10.5)
nRBC: 0 % (ref 0.0–0.2)

## 2023-10-10 LAB — CBC
HCT: 24.9 % — ABNORMAL LOW (ref 39.0–52.0)
Hemoglobin: 7.7 g/dL — ABNORMAL LOW (ref 13.0–17.0)
MCH: 32.9 pg (ref 26.0–34.0)
MCHC: 30.9 g/dL (ref 30.0–36.0)
MCV: 106.4 fL — ABNORMAL HIGH (ref 80.0–100.0)
Platelets: 497 K/uL — ABNORMAL HIGH (ref 150–400)
RBC: 2.34 MIL/uL — ABNORMAL LOW (ref 4.22–5.81)
RDW: 18.2 % — ABNORMAL HIGH (ref 11.5–15.5)
WBC: 16.5 K/uL — ABNORMAL HIGH (ref 4.0–10.5)
nRBC: 0 % (ref 0.0–0.2)

## 2023-10-10 LAB — BLOOD GAS, VENOUS
Acid-Base Excess: 3.9 mmol/L — ABNORMAL HIGH (ref 0.0–2.0)
Acid-Base Excess: 2.8 mmol/L — ABNORMAL HIGH (ref 0.0–2.0)
Bicarbonate: 31.3 mmol/L — ABNORMAL HIGH (ref 20.0–28.0)
Bicarbonate: 30.4 mmol/L — ABNORMAL HIGH (ref 20.0–28.0)
Drawn by: 27160
Drawn by: 27160
O2 Saturation: 29.4 %
O2 Saturation: 40.7 %
Patient temperature: 36.9
Patient temperature: 36.9
pCO2, Ven: 58 mmHg (ref 44–60)
pCO2, Ven: 59 mmHg (ref 44–60)
pH, Ven: 7.34 (ref 7.25–7.43)
pH, Ven: 7.32 (ref 7.25–7.43)
pO2, Ven: <31 mmHg — CL (ref 32–45)
pO2, Ven: <31 mmHg — CL (ref 32–45)

## 2023-10-10 LAB — CREATININE, SERUM
Creatinine, Ser: 0.95 mg/dL (ref 0.61–1.24)
GFR, Estimated: >60 mL/min (ref >60)

## 2023-10-10 LAB — GLUCOSE, CAPILLARY: Glucose-Capillary: 131 mg/dL — ABNORMAL HIGH (ref 70–99)

## 2023-10-10 LAB — TROPONIN I (HIGH SENSITIVITY)
Troponin I (High Sensitivity): 24 ng/L — ABNORMAL HIGH (ref <18)
Troponin I (High Sensitivity): 23 ng/L — ABNORMAL HIGH (ref <18)

## 2023-10-10 LAB — LACTIC ACID, PLASMA
Lactic Acid, Venous: 1.5 mmol/L (ref 0.5–1.9)
Lactic Acid, Venous: 1.8 mmol/L (ref 0.5–1.9)

## 2023-10-10 LAB — PROTIME-INR
INR: 1.1 (ref 0.8–1.2)
Prothrombin Time: 14.9 s (ref 11.4–15.2)

## 2023-10-10 LAB — BRAIN NATRIURETIC PEPTIDE: B Natriuretic Peptide: 271 pg/mL — ABNORMAL HIGH (ref 0.0–100.0)

## 2023-10-10 MED ORDER — FENTANYL CITRATE PF 50 MCG/ML IJ SOSY: 25.0000 ug | PREFILLED_SYRINGE | Freq: Once | INTRAMUSCULAR | Status: DC

## 2023-10-10 MED ORDER — DOCUSATE SODIUM 50 MG/5ML PO LIQD
100.0000 mg | Freq: Two times a day (BID) | ORAL | Status: DC
  Administered 2023-10-10 – 2023-10-18 (×12): 100 mg
  Filled 2023-10-10 (×13): qty 10

## 2023-10-10 MED ORDER — FENTANYL BOLUS VIA INFUSION
25.0000 ug | INTRAVENOUS | Status: DC | PRN
  Administered 2023-10-11: 100 ug via INTRAVENOUS
  Administered 2023-10-11: 50 ug via INTRAVENOUS
  Administered 2023-10-11: 100 ug via INTRAVENOUS
  Administered 2023-10-11: 50 ug via INTRAVENOUS
  Administered 2023-10-11: 100 ug via INTRAVENOUS
  Administered 2023-10-11 – 2023-10-12 (×2): 50 ug via INTRAVENOUS
  Administered 2023-10-12 – 2023-10-13 (×5): 100 ug via INTRAVENOUS
  Administered 2023-10-14: 50 ug via INTRAVENOUS
  Administered 2023-10-14 (×3): 100 ug via INTRAVENOUS
  Administered 2023-10-15 (×2): 50 ug via INTRAVENOUS

## 2023-10-10 MED ORDER — CHLORHEXIDINE GLUCONATE CLOTH 2 % EX PADS
6.0000 | MEDICATED_PAD | Freq: Every day | CUTANEOUS | Status: DC
  Administered 2023-10-10 – 2023-10-13 (×3): 6 via TOPICAL

## 2023-10-10 MED ORDER — ETOMIDATE 2 MG/ML IV SOLN
INTRAVENOUS | Status: AC
  Administered 2023-10-10: 20 mg via INTRAVENOUS
  Filled 2023-10-10: qty 10

## 2023-10-10 MED ORDER — METHYLPREDNISOLONE SODIUM SUCC 40 MG IJ SOLR
40.0000 mg | Freq: Two times a day (BID) | INTRAMUSCULAR | Status: DC
  Administered 2023-10-10 – 2023-10-13 (×6): 40 mg via INTRAVENOUS
  Filled 2023-10-10 (×6): qty 1

## 2023-10-10 MED ORDER — IPRATROPIUM-ALBUTEROL 0.5-2.5 (3) MG/3ML IN SOLN
3.0000 mL | Freq: Once | RESPIRATORY_TRACT | Status: AC
  Administered 2023-10-10: 3 mL via RESPIRATORY_TRACT
  Filled 2023-10-10: qty 3

## 2023-10-10 MED ORDER — SODIUM CHLORIDE 0.9 % IV SOLN
1.0000 g | Freq: Once | INTRAVENOUS | Status: AC
  Administered 2023-10-10: 1 g via INTRAVENOUS
  Filled 2023-10-10: qty 10

## 2023-10-10 MED ORDER — SODIUM CHLORIDE 0.9 % IV SOLN: 2.0000 g | Freq: Two times a day (BID) | INTRAVENOUS | Status: DC

## 2023-10-10 MED ORDER — MIDAZOLAM HCL 5 MG/5ML IJ SOLN
4.0000 mg | Freq: Once | INTRAMUSCULAR | Status: AC
  Administered 2023-10-10: 4 mg via INTRAVENOUS
  Filled 2023-10-10: qty 5

## 2023-10-10 MED ORDER — PROPOFOL 1000 MG/100ML IV EMUL
0.0000 ug/kg/min | INTRAVENOUS | Status: DC
  Administered 2023-10-10: 10 ug/kg/min via INTRAVENOUS
  Filled 2023-10-10: qty 100

## 2023-10-10 MED ORDER — DOCUSATE SODIUM 100 MG PO CAPS: 100.0000 mg | ORAL_CAPSULE | Freq: Two times a day (BID) | ORAL | Status: DC | PRN

## 2023-10-10 MED ORDER — SUCCINYLCHOLINE CHLORIDE 200 MG/10ML IV SOSY
PREFILLED_SYRINGE | INTRAVENOUS | Status: AC
  Administered 2023-10-10: 120 mg via INTRAVENOUS
  Filled 2023-10-10: qty 10

## 2023-10-10 MED ORDER — PIPERACILLIN-TAZOBACTAM 3.375 G IVPB 30 MIN
3.3750 g | Freq: Once | INTRAVENOUS | Status: AC
  Administered 2023-10-10: 3.375 g via INTRAVENOUS
  Filled 2023-10-10: qty 50

## 2023-10-10 MED ORDER — SODIUM CHLORIDE 0.9% FLUSH: 10.0000 mL | INTRAVENOUS | Status: DC | PRN

## 2023-10-10 MED ORDER — VANCOMYCIN HCL IN DEXTROSE 1-5 GM/200ML-% IV SOLN
1000.0000 mg | Freq: Once | INTRAVENOUS | Status: AC
  Administered 2023-10-10: 1000 mg via INTRAVENOUS
  Filled 2023-10-10: qty 200

## 2023-10-10 MED ORDER — NOREPINEPHRINE 4 MG/250ML-% IV SOLN
0.0000 ug/min | INTRAVENOUS | Status: DC
  Administered 2023-10-10: 4 ug/min via INTRAVENOUS

## 2023-10-10 MED ORDER — ORAL CARE MOUTH RINSE: 15.0000 mL | OROMUCOSAL | Status: DC | PRN

## 2023-10-10 MED ORDER — INSULIN ASPART 100 UNIT/ML IJ SOLN
2.0000 [IU] | INTRAMUSCULAR | Status: DC
  Administered 2023-10-11 (×2): 2 [IU] via SUBCUTANEOUS
  Administered 2023-10-11 (×2): 4 [IU] via SUBCUTANEOUS

## 2023-10-10 MED ORDER — VANCOMYCIN HCL 1250 MG/250ML IV SOLN: 1250.0000 mg | INTRAVENOUS | Status: DC

## 2023-10-10 MED ORDER — FAMOTIDINE 20 MG PO TABS
20.0000 mg | ORAL_TABLET | Freq: Two times a day (BID) | ORAL | Status: DC
  Administered 2023-10-10 – 2023-10-18 (×16): 20 mg
  Filled 2023-10-10 (×16): qty 1

## 2023-10-10 MED ORDER — MIDAZOLAM-SODIUM CHLORIDE 100-0.9 MG/100ML-% IV SOLN
0.5000 mg/h | INTRAVENOUS | Status: DC
  Administered 2023-10-10: 0.5 mg/h via INTRAVENOUS
  Filled 2023-10-10: qty 100

## 2023-10-10 MED ORDER — FENTANYL CITRATE (PF) 100 MCG/2ML IJ SOLN
100.0000 ug | Freq: Once | INTRAMUSCULAR | Status: AC
  Administered 2023-10-10: 100 ug via INTRAVENOUS
  Filled 2023-10-10: qty 2

## 2023-10-10 MED ORDER — SODIUM CHLORIDE 0.9 % IV SOLN
500.0000 mg | INTRAVENOUS | Status: DC
  Administered 2023-10-10 – 2023-10-14 (×5): 500 mg via INTRAVENOUS
  Filled 2023-10-10 (×7): qty 5

## 2023-10-10 MED ORDER — POLYETHYLENE GLYCOL 3350 17 G PO PACK: 17.0000 g | PACK | Freq: Every day | ORAL | Status: DC | PRN

## 2023-10-10 MED ORDER — PROPOFOL 1000 MG/100ML IV EMUL
0.0000 ug/kg/min | INTRAVENOUS | Status: DC
  Administered 2023-10-10: 25 ug/kg/min via INTRAVENOUS
  Administered 2023-10-11: 30 ug/kg/min via INTRAVENOUS
  Administered 2023-10-11 (×2): 40 ug/kg/min via INTRAVENOUS
  Administered 2023-10-11: 20 ug/kg/min via INTRAVENOUS
  Administered 2023-10-12: 35 ug/kg/min via INTRAVENOUS
  Filled 2023-10-10 (×5): qty 100

## 2023-10-10 MED ORDER — IOHEXOL 350 MG/ML SOLN
60.0000 mL | Freq: Once | INTRAVENOUS | Status: AC | PRN
  Administered 2023-10-10: 60 mL via INTRAVENOUS

## 2023-10-10 MED ORDER — SODIUM CHLORIDE 0.9 % IV SOLN
2.0000 g | Freq: Two times a day (BID) | INTRAVENOUS | Status: DC
  Administered 2023-10-11: 2 g via INTRAVENOUS
  Filled 2023-10-10: qty 12.5

## 2023-10-10 MED ORDER — LACTATED RINGERS IV BOLUS
1000.0000 mL | Freq: Once | INTRAVENOUS | Status: AC
  Administered 2023-10-10: 1000 mL via INTRAVENOUS

## 2023-10-10 MED ORDER — SODIUM CHLORIDE 0.9 % IV SOLN: 250.0000 mL | INTRAVENOUS | Status: AC

## 2023-10-10 MED ORDER — ORAL CARE MOUTH RINSE
15.0000 mL | OROMUCOSAL | Status: DC
  Administered 2023-10-10 – 2023-10-15 (×54): 15 mL via OROMUCOSAL

## 2023-10-10 MED ORDER — LACTATED RINGERS IV SOLN: INTRAVENOUS | Status: AC

## 2023-10-10 MED ORDER — PANTOPRAZOLE SODIUM 40 MG PO TBEC: 40.0000 mg | DELAYED_RELEASE_TABLET | Freq: Every day | ORAL | Status: DC

## 2023-10-10 MED ORDER — FENTANYL 2500MCG IN NS 250ML (10MCG/ML) PREMIX INFUSION
0.0000 ug/h | INTRAVENOUS | Status: DC
  Administered 2023-10-10: 50 ug/h via INTRAVENOUS
  Filled 2023-10-10: qty 250

## 2023-10-10 MED ORDER — ETOMIDATE 2 MG/ML IV SOLN: 20.0000 mg | Freq: Once | INTRAVENOUS | Status: AC

## 2023-10-10 MED ORDER — POLYETHYLENE GLYCOL 3350 17 G PO PACK
17.0000 g | PACK | Freq: Every day | ORAL | Status: DC
  Administered 2023-10-10 – 2023-10-18 (×7): 17 g
  Filled 2023-10-10 (×7): qty 1

## 2023-10-10 MED ORDER — HEPARIN SODIUM (PORCINE) 5000 UNIT/ML IJ SOLN
5000.0000 [IU] | Freq: Three times a day (TID) | INTRAMUSCULAR | Status: DC
  Administered 2023-10-10 – 2023-10-12 (×5): 5000 [IU] via SUBCUTANEOUS
  Filled 2023-10-10 (×5): qty 1

## 2023-10-10 MED ORDER — FENTANYL 2500MCG IN NS 250ML (10MCG/ML) PREMIX INFUSION
0.0000 ug/h | INTRAVENOUS | Status: DC
  Administered 2023-10-12: 50 ug/h via INTRAVENOUS
  Administered 2023-10-13: 25 ug/h via INTRAVENOUS
  Administered 2023-10-13: 75 ug/h via INTRAVENOUS
  Administered 2023-10-14 (×2): 125 ug/h via INTRAVENOUS
  Filled 2023-10-10 (×4): qty 250

## 2023-10-10 MED ORDER — SUCCINYLCHOLINE CHLORIDE 200 MG/10ML IV SOSY: 120.0000 mg | PREFILLED_SYRINGE | Freq: Once | INTRAVENOUS | Status: AC

## 2023-10-10 MED ORDER — NOREPINEPHRINE 4 MG/250ML-% IV SOLN
0.0000 ug/min | INTRAVENOUS | Status: DC
  Filled 2023-10-10: qty 250

## 2023-10-10 NOTE — ED Notes (Signed)
 ED Provider at bedside. Zammit ED MD, and Rigney PA. Meds scanned prior to be given. Respiratory at bedside.

## 2023-10-10 NOTE — ED Notes (Signed)
 Etomidate  in at 1445

## 2023-10-10 NOTE — ED Notes (Signed)
 ED Provider at bedside.

## 2023-10-10 NOTE — ED Notes (Signed)
 ED Provider at bedside speaking with significant other

## 2023-10-10 NOTE — Sepsis Progress Note (Signed)
 Elink following code sepsis

## 2023-10-10 NOTE — ED Triage Notes (Signed)
 Pt arrives via [facility] from home for SOB. Reportedly pt discharged yesterday from [facility] facility for sepsis d/t PNA. Per EMS, pt had a call out to 911 overnight for a fall and was found to be hypoxic to 80% on home 2 lpm O2. Pt refused transport overnight, but fiance called again today for SOB. On EMS arrival today pt was 76% on home oxygen  and improved to 92% on 4 lpm. Pt also hypotensive enroute with SBP 56. Given 500 mL fluid bolus and improved to SBP 90's. Pt alert and oriented x4. C/o hip pain and R arm pain secondary to fall overnight.

## 2023-10-10 NOTE — ED Notes (Signed)
 120 mg succinylcholine  at 1446

## 2023-10-10 NOTE — ED Notes (Signed)
 Date and time results received: 10/10/23 1102 (use smartphrase .now to insert current time)  Test: PO2 Critical Value: less than 31  Name of Provider Notified: Dr. Zammit  Orders Received? Or Actions Taken?: Orders Received - See Orders for details

## 2023-10-10 NOTE — Progress Notes (Signed)
 eLink Physician-Brief Progress Note Patient Name: John [name] DOB: 05/13/1945 MRN: [id number]   Date of Service  10/10/2023  HPI/Events of Note  RN requesting bilateral wrist restraints for patient at high risk of self extubation.  Also needs sliding scale for glycemic control.  Lastly, he requires a Foley catheter order for 1 that came up with patient from ED.  eICU Interventions  Orders placed.     Intervention Category Minor Interventions: Routine modifications to care plan (e.g. PRN medications for pain, fever)  Jerilynn Berg 10/10/2023, 10:02 PM

## 2023-10-10 NOTE — ED Provider Notes (Signed)
 Procedure Name: Intubation Date/Time: 10/10/2023 2:51 PM  Performed by: Suzette Pac, MDOxygen Delivery Method: Ambu bag Preoxygenation: Pre-oxygenation with 100% oxygen  Ventilation: Mask ventilation without difficulty Laryngoscope Size: 1 Grade View: Grade II Tube type: Non-subglottic suction tube Tube size: 7.5 mm Number of attempts: 1 Placement Confirmation: ETT inserted through vocal cords under direct vision and Positive ETCO2 Comments: Patient was given etomidate  and succinylcholine .  Patient was intubated on the first try without problems        Suzette Pac, MD 10/10/23 1453

## 2023-10-10 NOTE — Progress Notes (Addendum)
   10/10/23 1812  Vent Select  $ Ventilator Initial/Subsequent  Initial  Invasive or Noninvasive Invasive  Adult Vent Y  Vent start date 10/10/23  Vent start time 1455 (Intubated @ [name])  Airway 7.5 mm  Placement Date/Time: 10/10/23 1447   Placed By: ED Physician  Airway Device: Endotracheal Tube  ETT Types: Oral  Size (mm): 7.5 mm  Cuffed: Cuffed  Insertion attempts: 1  Placement Confirmation: Chest Rise;ETCO2 Detector  Secured at (cm): 23 cm  Secured at (cm) 25 cm  Measured From Lips  Secured Location Center  Secured By English as a second language teacher No  Tube Holder Repositioned Yes  Head position Right  Cuff Pressure (cm H2O) Green OR 18-26 CmH2O  Site Condition Drainage (Comment)  Adult IBW/VT Calculations  Height 5' 6 (1.676 m)  IBW/kg (Calculated) Male 63.8 kg  Low Range Vt 6cc/kg MALE 382.8 mL  Adult Moderate Range Vt 8cc/kg MA 510.4 mL  Adult High Range Vt 10cc/kg MALE 638 mL  IBW/kg (Calculated) MALE 59.3 kg  Low Range Vt 6cc/kg MALE 355.8 mL  Adult Moderate Range vt 8cc/kg MALE 474.4 mL  Adult High Range Vt 10cc/kg MALE 593 mL  Adult Ventilator Settings  Vent Type Servo i  Humidity HME  Vent Mode (S)  PRVC  Vt Set (S)  510 mL (per 8 cc/kg.)  Set Rate (S)  16 bmp  FiO2 (%) (S)  40 %  I Time 0.9 Sec(s)  PEEP (S)  5 cmH20  Adult Ventilator Measurements  Peak Airway Pressure 34 L/min  Mean Airway Pressure 12 cmH20  Plateau Pressure 15 cmH20  Resp Rate Spontaneous 4 br/min  Resp Rate Total 20 br/min  Exhaled Vt 771 mL  Measured Ve 8.4 L  I:E Ratio Measured 1:3.2  Total PEEP 5 cmH20  SpO2 100 %  Adult Ventilator Alarms  Alarms On Y  Ve High Alarm 26 L/min  Ve Low Alarm 4 L/min  Resp Rate High Alarm 35 br/min  Resp Rate Low Alarm 10  PEEP Low Alarm 3 cmH2O  Press High Alarm 40 cmH2O  T Apnea 20 sec(s)  VAP Prevention  HOB> 30 Degrees Y  Daily Weaning Assessment  Daily Assessment of Readiness to Wean Wean protocol criteria met (SBT  performed) (new vent from [employer])  Breath Sounds  Bilateral Breath Sounds Diminished  Vent Respiratory Assessment  Level of Consciousness Responds to Pain  Respiratory Pattern Tachypnea (during repositioning.)  Patient Tolerance Tolerated well  Suction Method  Respiratory Interventions Airway suction;Oral suction  Oral Suctioning/Secretions  Suction Type Oral  Suction Device Yankauer  Secretion Amount Moderate  Secretion Color Clear  Secretion Consistency Thin;Thick  Suction Tolerance Tolerated well  Suctioning Adverse Effects None  Airway Suctioning/Secretions  Suction Type ETT  Suction Device  Catheter  Inline suction changed Yes  Secretion Amount Moderate  Secretion Color Yellow  Secretion Consistency Thick  Suction Tolerance Tolerated well  Suctioning Adverse Effects None  [place] Agitation Sedation Scale  Richmond Agitation Sedation Scale (RASS) 2   ABG 1 hr post vent change: due @ 1912.

## 2023-10-10 NOTE — H&P (Signed)
 NAME:  John [name], MRN:  [id number], DOB:  1945/06/09, LOS: 0 ADMISSION DATE:  10/10/2023, CONSULTATION DATE: 10/10/2023 REFERRING MD: Fairy Sermon, MD, CHIEF COMPLAINT: Acute respiratory failure  History of Present Illness:   78 year old with history of adenocarcinoma of the lung and squamous cell carcinoma, COPD, asthma, OSA chronic respiratory failure on home oxygen , a flutter, CHF.  Recently admitted to [facility] ER for sepsis, hypoxic respiratory failure secondary to pneumonia, AKI.  Patient wanted an early discharge and sent home on 7/4 but returned to the emergency room today with generalized weakness, fall overnight.  Maintained on BiPAP, noted to have slowly downtrending hemoglobin which is being monitored.  Was altered in the ED.  Given ceftriaxone , azithromycin , Vanco, Zosyn , LR and intubated.  Transferred to ICU for further management.  Of note patient patient has an history of stage III A adenocarcinoma diagnosed in 2018 treated with radiation, carboplatin , paclitaxel  and durvalumab .  Unfortunately he continued to smoke and PET scan in March 2025 showed recurrence in bilateral upper and lower lobes which was confirmed as stage IV squamous cell cancer stage by bronchoscopic biopsy in April 2025.  He got 1 round of chemotherapy but did not tolerate anymore and was placed on nivolumab  infusion on 7/1.  Pertinent  Medical History    has a past medical history of Adenocarcinoma of lung, right (HCC) (04/18/2016), Anxiety, Arthritis, Asthma, Atrial flutter (HCC), BPH (benign prostatic hyperplasia), CHF (congestive heart failure) (HCC), COPD (chronic obstructive pulmonary disease) (HCC), Depression, Diabetes mellitus, type II (HCC), Dyspnea, GERD (gastroesophageal reflux disease), History of blood transfusion, History of kidney stones, History of radiation therapy, History of radiation therapy, Hyperlipidemia, Hypertension, Hypothyroidism, IDA (iron  deficiency anemia), Macular degeneration,  Neuropathy, Non-small cell lung cancer, right (HCC) (04/18/2016), Oxygen  dependent, PAD (peripheral artery disease) (HCC) (03/2023), Peripheral vascular disease (HCC), Prostatitis, Pulmonary nodule, left (07/16/2016), and Sleep apnea (2019).   Significant Hospital Events: Including procedures, antibiotic start and stop dates in addition to other pertinent events     Interim History / Subjective:     Objective    Blood pressure (!) 128/59, pulse 96, temperature 97.9 F (36.6 C), temperature source Axillary, resp. rate 17, height 5' 6 (1.676 m), SpO2 100%.    Vent Mode: PRVC FiO2 (%):  [40 %] 40 % Set Rate:  [16 bmp] 16 bmp Vt Set:  [450 mL] 450 mL PEEP:  [5 cmH20] 5 cmH20   Intake/Output Summary (Last 24 hours) at 10/10/2023 1808 Last data filed at 10/10/2023 1539 Gross per 24 hour  Intake 1042.46 ml  Output 150 ml  Net 892.46 ml   There were no vitals filed for this visit.  Examination: Blood pressure (!) 128/59, pulse 96, temperature 97.9 F (36.6 C), temperature source Axillary, resp. rate 17, height 5' 6 (1.676 m), SpO2 100%. Gen:      No acute distress HEENT:  EOMI, sclera anicteric, ETT Neck:     No masses; no thyromegaly Lungs:    Clear to auscultation bilaterally; normal respiratory effort CV:         Regular rate and rhythm; no murmurs Abd:      + bowel sounds; soft, non-tender; no palpable masses, no distension Ext:    No edema; adequate peripheral perfusion Neuro: alert and oriented x 3 Psych: normal mood and affect   Lab/imaging reviewed Significant for BUN/creatinine 29/1.60 BNP 271, troponin 23 Lactic acid 1.8 WBC 17.7, hemoglobin 9.6, platelets 546  CTA with no PE, bilateral opacities consistent with known lung cancer,  basilar airspace disease, postobstructive pneumonia CT head, spine with no acute process. Chest x-rays forearm and hip with no fractures  Resolved problem list   Assessment and Plan  Acute on chronic hypoxic, hypercarbic respiratory  failure Sepsis present on admission Secondary to COPD exacerbation, hospital-acquired pneumonia, recurrent lung cancer Continue vent support, follow chest x-ray, ABG Broad antibiotic coverage with Vanco, cefepime  Follow cultures Less concern for nivolumab  induced pneumonitis as he got only 1 dose.  Nonetheless we will start him on some steroids Give additional fluids, wean pressors  Anemia of chronic illness Declining hemoglobin noted at [facility]  No evidence of active bleed Monitor CBC and transfuse for hemoglobin less than 7  AKI Monitor urine output and creatinine  Chronic diastolic heart failure Hold outpatient cardiac medications and diuretics  Decreased appetite, weight loss, failure to thrive Malnutrition, present on admission Will get nutrition involved tomorrow to assess status.  Best Practice (right click and Reselect all SmartList Selections daily)   Diet/type: NPO DVT prophylaxis prophylactic heparin   Pressure ulcer(s): N/A GI prophylaxis: PPI Lines: N/A Foley:  N/A Code Status:  full code Last date of multidisciplinary goals of care discussion []   Critical care time:    The patient is critically ill with multiple organ system failure and requires high complexity decision making for assessment and support, frequent evaluation and titration of therapies, advanced monitoring, review of radiographic studies and interpretation of complex data.   Critical Care Time devoted to patient care services, exclusive of separately billable procedures, described in this note is 35  minutes.   Desaree Downen MD [facility] See Amion for pager  If no response to pager , please call [phone] until 7pm After 7:00 pm call Elink  [phone] 10/11/2023, 8:00 AM

## 2023-10-10 NOTE — ED Provider Notes (Signed)
 [place] EMERGENCY DEPARTMENT AT [facility] Provider Note   CSN: [id number] Arrival date & time: 10/10/23  9043     Patient presents with: Shortness of Breath   John [name] is a 78 y.o. male.   Patient is a 78 year old male who presents to the emergency department by EMS secondary to shortness of breath, generalized weakness and fall overnight.  On presentation the patient is drowsy but will answer questions.  He is stating he is having pain in his right forearm and bilateral hips at this time.  Is unclear if he hit his head last night.  He denies any pain to his neck or back at this point.  He denies any abdominal pain, nausea, vomiting, diarrhea.  It is noted that the patient was just discharged from [facility] yesterday secondary to sepsis, hypoxic respiratory failure and pneumonia.   Shortness of Breath      Prior to Admission medications   Medication Sig Start Date End Date Taking? Authorizing Provider  albuterol  (VENTOLIN  HFA) 108 (90 Base) MCG/ACT inhaler INHALE 2 PUFFS INTO THE LUNGS EVERY 6 HOURS AS NEEDED FOR WHEEZE OR SHORTNESS OF BREATH 09/09/23   Hawks, Christy A, FNP  allopurinol  (ZYLOPRIM ) 300 MG tablet Take 1 tablet (300 mg total) by mouth daily. 09/04/23   Lavell Lye A, FNP  Blood Glucose Monitoring Suppl (ONETOUCH VERIO REFLECT) w/Device KIT Use to test blood sugars daily as directed. DX: E11.9 10/14/19   Lavell Lye A, FNP  cephALEXin  (KEFLEX ) 500 MG capsule Take 500 mg by mouth 3 (three) times daily. 09/22/23   [provider]  clopidogrel  (PLAVIX ) 75 MG tablet TAKE 1 TABLET BY MOUTH EVERY DAY 09/10/23   Gretta Lonni PARAS, MD  Continuous Glucose Receiver (FREESTYLE LIBRE 3 READER) DEVI 1 Device by Does not apply route every 14 (fourteen) days. 08/11/22   Lavell Lye LABOR, FNP  Continuous Glucose Sensor (FREESTYLE LIBRE 3 SENSOR) MISC 1 Device by Does not apply route continuous. 08/11/22   Lavell Lye LABOR, FNP  diltiazem  (CARDIZEM  CD) 120  MG 24 hr capsule Take 1 capsule (120 mg total) by mouth daily as needed. 08/19/23   Lesia Ozell Barter, PA-C  docusate sodium  (COLACE) 100 MG capsule Take 1 capsule (100 mg total) by mouth daily as needed for mild constipation. 05/31/23 05/30/24  Cheryle Page, MD  dutasteride  (AVODART ) 0.5 MG capsule Take 1 capsule (0.5 mg total) by mouth daily. 10/31/22   McKenzie, Belvie CROME, MD  famotidine  (PEPCID ) 20 MG tablet Take 1 tablet (20 mg total) by mouth daily. 02/19/23 05/29/24  Maree, Pratik D, DO  ferrous sulfate  325 (65 FE) MG EC tablet Take 1 tablet (325 mg total) by mouth daily. 05/31/23 05/30/24  Cheryle Page, MD  Fluticasone -Umeclidin-Vilant (TRELEGY ELLIPTA ) 100-62.5-25 MCG/ACT AEPB TAKE 1 PUFF BY MOUTH EVERY DAY 09/09/23   Lavell Lye A, FNP  gabapentin  (NEURONTIN ) 300 MG capsule Take 1 capsule (300 mg total) by mouth 3 (three) times daily. 09/04/23   Lavell Lye LABOR, FNP  insulin  degludec (TRESIBA  FLEXTOUCH) 100 UNIT/ML FlexTouch Pen Inject 20 Units into the skin at bedtime. 08/24/23   Therisa Benton PARAS, NP  Insulin  Pen Needle (BD PEN NEEDLE NANO U/F) 32G X 4 MM MISC Use to inject insulin  once daily 08/24/23   Therisa Benton PARAS, NP  levothyroxine  (SYNTHROID ) 50 MCG tablet Take 1 tablet (50 mcg total) by mouth daily. 08/24/23   Therisa Benton PARAS, NP  linaclotide  (LINZESS ) 72 MCG capsule Take 1 capsule (72  mcg total) by mouth daily before breakfast. 04/30/23   Lavell Lye A, FNP  megestrol  (MEGACE ) 400 MG/10ML suspension Take 10 mLs (400 mg total) by mouth 2 (two) times daily. 09/04/23   Lavell Lye A, FNP  metolazone  (ZAROXOLYN ) 2.5 MG tablet Take 1 tablet (2.5 mg total) by mouth 3 (three) times a week. 08/26/23   O'NealDarryle Ned, MD  mupirocin  ointment (BACTROBAN ) 2 % Apply topically 2 (two) times daily. 08/27/23   [provider]  NON FORMULARY CPAP at bedtime    [provider]  ondansetron  (ZOFRAN ) 4 MG tablet Take 1 tablet (4 mg total) by mouth every 8 (eight)  hours as needed for nausea or vomiting. 04/30/23   Lavell Lye LABOR, FNP  OneTouch Delica Lancets 30G MISC Use to test blood sugars daily as directed. DX: E11.9 02/24/22   Therisa Benton PARAS, NP  OXYGEN  Inhale 2 L into the lungs daily as needed (with Cpap at night).    [provider]  pantoprazole  (PROTONIX ) 40 MG tablet Take 1 tablet (40 mg total) by mouth daily. 07/30/23   Lavell Lye A, FNP  pravastatin  (PRAVACHOL ) 40 MG tablet TAKE 1 TABLET (40 MG TOTAL) BY MOUTH ON MONDAYS , WEDNESDAYS, AND FRIDAYS AS DIRECTED 07/30/23   Lavell Lye A, FNP  prochlorperazine  (COMPAZINE ) 5 MG tablet Take 1 tablet (5 mg total) by mouth every 6 (six) hours as needed for nausea or vomiting. 09/24/23   Rogers Hai, MD  propranolol  (INDERAL ) 10 MG tablet Take 1 tablet (10 mg total) by mouth 3 (three) times daily as needed. 04/27/23   Jerilynn Lamarr HERO, NP  spironolactone  (ALDACTONE ) 25 MG tablet Take 1 tablet (25 mg total) by mouth 2 (two) times daily. 08/19/23   Jerilynn Lamarr HERO, NP  torsemide  (DEMADEX ) 20 MG tablet TAKE 1/2 TABLET BY MOUTH DAILY 09/09/23   Lavell Lye A, FNP  zolpidem  (AMBIEN ) 5 MG tablet Take 1 tablet (5 mg total) by mouth at bedtime as needed for sleep. 09/04/23   Lavell Lye LABOR, FNP    Allergies: Benadryl  [diphenhydramine ], Bisoprolol , Sulfa  antibiotics, Atorvastatin, Jardiance  [empagliflozin ], Lopressor  [metoprolol ], Rosuvastatin, Doxycycline , Temazepam , and Tramadol     Review of Systems  Respiratory:  Positive for shortness of breath.   All other systems reviewed and are negative.   Updated Vital Signs BP 110/60   Pulse (!) 107   Temp 98.5 F (36.9 C) (Oral)   Resp 15   SpO2 98%   Physical Exam Vitals and nursing note reviewed.  Constitutional:      Appearance: Normal appearance. He is ill-appearing.  HENT:     Head: Normocephalic and atraumatic.     Nose: Nose normal.     Mouth/Throat:     Mouth: Mucous membranes are moist.  Eyes:     Extraocular  Movements: Extraocular movements intact.     Conjunctiva/sclera: Conjunctivae normal.     Pupils: Pupils are equal, round, and reactive to light.  Cardiovascular:     Rate and Rhythm: Normal rate and regular rhythm.     Pulses: Normal pulses.     Heart sounds: Murmur heard.  Pulmonary:     Effort: Tachypnea and respiratory distress present.     Breath sounds: Examination of the right-upper field reveals decreased breath sounds. Examination of the right-middle field reveals decreased breath sounds. Examination of the right-lower field reveals decreased breath sounds. Decreased breath sounds and wheezing present. No rhonchi or rales.  Chest:     Chest wall: No tenderness.  Abdominal:     General: Abdomen is flat. Bowel sounds are normal.     Palpations: Abdomen is soft. There is no mass.     Tenderness: There is no abdominal tenderness. There is no guarding.  Musculoskeletal:        General: Normal range of motion.     Cervical back: Normal range of motion and neck supple.     Right lower leg: No edema.     Left lower leg: No edema.  Skin:    General: Skin is warm and dry.     Comments: Skin tear noted to the left forearm  Neurological:     General: No focal deficit present.     Mental Status: He is alert and oriented to person, place, and time. Mental status is at baseline.  Psychiatric:        Mood and Affect: Mood normal.        Behavior: Behavior normal.        Thought Content: Thought content normal.        Judgment: Judgment normal.     (all labs ordered are listed, but only abnormal results are displayed) Labs Reviewed  COMPREHENSIVE METABOLIC PANEL WITH GFR - Abnormal; Notable for the following components:      Result Value   Glucose, Bld 161 (*)    BUN 29 (*)    Creatinine, Ser 1.60 (*)    Albumin 2.4 (*)    AST 13 (*)    GFR, Estimated 44 (*)    All other components within normal limits  CBC WITH DIFFERENTIAL/PLATELET - Abnormal; Notable for the following  components:   WBC 17.7 (*)    RBC 2.95 (*)    Hemoglobin 9.6 (*)    HCT 31.7 (*)    MCV 107.5 (*)    RDW 18.1 (*)    Platelets 546 (*)    Neutro Abs 14.6 (*)    Monocytes Absolute 1.6 (*)    Abs Immature Granulocytes 0.18 (*)    All other components within normal limits  URINALYSIS, W/ REFLEX TO CULTURE (INFECTION SUSPECTED) - Abnormal; Notable for the following components:   Color, Urine AMBER (*)    APPearance HAZY (*)    Hgb urine dipstick MODERATE (*)    Protein, ur 30 (*)    All other components within normal limits  BRAIN NATRIURETIC PEPTIDE - Abnormal; Notable for the following components:   B Natriuretic Peptide 271.0 (*)    All other components within normal limits  BLOOD GAS, VENOUS - Abnormal; Notable for the following components:   pO2, Ven <31 (*)    Bicarbonate 31.3 (*)    Acid-Base Excess 3.9 (*)    All other components within normal limits  TROPONIN I (HIGH SENSITIVITY) - Abnormal; Notable for the following components:   Troponin I (High Sensitivity) 24 (*)    All other components within normal limits  TROPONIN I (HIGH SENSITIVITY) - Abnormal; Notable for the following components:   Troponin I (High Sensitivity) 23 (*)    All other components within normal limits  CULTURE, BLOOD (ROUTINE X 2)  CULTURE, BLOOD (ROUTINE X 2)  RESP PANEL BY RT-PCR (RSV, FLU A&B, COVID)  [id number]  URINE CULTURE  LACTIC ACID, PLASMA  LACTIC ACID, PLASMA  PROTIME-INR  BLOOD GAS, VENOUS    EKG: None  Radiology: DG Chest Port 1 View Result Date: 10/10/2023 CLINICAL DATA:  No acute fracture or dislocation identified about the right forearm. EXAM: PORTABLE CHEST 1  VIEW COMPARISON:  Chest CTA today reported separately. Radiographs 09/17/2023. FINDINGS: Portable AP semi upright view at 1107 hours. Chronic left upper lobe lung tumor. Superimposed left chest power port. Superimposed left chest cardiac loop recorder or superficial ICD is new since radiographs 09/17/2023. New right lung  base opacification, see CTA. Mediastinal contours not significantly changed. Negative visible bowel gas pattern. No acute osseous abnormality identified. IMPRESSION: 1. New right lung base opacification since 09/17/2023, see CTA Chest today reported separately. 2. Known left upper lobe tumor. 3. New left chest cardiac loop recorder or superficial ICD since last month. Electronically Signed   By: VEAR Hurst M.D.   On: 10/10/2023 12:13   DG Forearm Right Result Date: 10/10/2023 CLINICAL DATA:  78 year old male recently hospitalized. Sepsis. Fall, found hypoxic. Hypotensive. Pain. Lung cancer. EXAM: RIGHT FOREARM - 2 VIEW COMPARISON:  Right wrist series 08/10/2016. FINDINGS: Two views. Calcified peripheral vascular disease. Mild intravenous access artifact. Right radius and ulna appear intact. Alignment appears maintained at the right wrist and elbow. IMPRESSION: No acute fracture or dislocation identified about the right forearm. Electronically Signed   By: VEAR Hurst M.D.   On: 10/10/2023 12:11   DG HIP UNILAT WITH PELVIS 2-3 VIEWS LEFT Result Date: 10/10/2023 CLINICAL DATA:  78 year old male recently hospitalized. Sepsis. Fall, found hypoxic. Hypotensive. Pain. Lung cancer. EXAM: DG HIP (WITH OR WITHOUT PELVIS) 2-3V LEFT COMPARISON:  Pelvis and right hip series today.  Prior 11/03/2022. FINDINGS: Three views. Pelvis appears stable and intact. Left femoral head normally located and proximal left femur appears intact. Mid left femoral shaft is over penetrated on image #2. Vascular calcifications. IMPRESSION: No acute fracture or dislocation identified about the left hip. Electronically Signed   By: VEAR Hurst M.D.   On: 10/10/2023 12:10   DG HIP UNILAT WITH PELVIS 2-3 VIEWS RIGHT Result Date: 10/10/2023 CLINICAL DATA:  78 year old male recently hospitalized. Sepsis. Fall, found hypoxic. Hypotensive. Pain. Lung cancer. EXAM: DG HIP (WITH OR WITHOUT PELVIS) 2-3V RIGHT COMPARISON:  Pelvis and hip radiographs 11/03/2022.  FINDINGS: Three views 1110 hours. Pelvis appears stable and intact. Femoral heads normally located. Chronic lumbar spine degeneration. Nonobstructed bowel-gas pattern. Grossly intact proximal right femur. Proximal right femur appears intact on dedicated views. No acute osseous abnormality identified. Right femoral long segment artery vascular stent partially visible. IMPRESSION: No acute fracture or dislocation identified about the right hip or pelvis. Electronically Signed   By: VEAR Hurst M.D.   On: 10/10/2023 12:09   CT Angio Chest PE W/Cm &/Or Wo Cm Result Date: 10/10/2023 CLINICAL DATA:  78 year old male recently hospitalized. Sepsis. Fall, found hypoxic. Hypotensive. Pain. Lung cancer. * Tracking Code: BO * EXAM: CT ANGIOGRAPHY CHEST WITH CONTRAST TECHNIQUE: Multidetector CT imaging of the chest was performed using the standard protocol during bolus administration of intravenous contrast. Multiplanar CT image reconstructions and MIPs were obtained to evaluate the vascular anatomy. RADIATION DOSE REDUCTION: This exam was performed according to the departmental dose-optimization program which includes automated exposure control, adjustment of the mA and/or kV according to patient size and/or use of iterative reconstruction technique. CONTRAST:  60mL OMNIPAQUE  IOHEXOL  350 MG/ML SOLN COMPARISON:  PET-CT 07/09/2023. FINDINGS: Cardiovascular: Good contrast bolus timing in the pulmonary arterial tree. No pulmonary artery filling defect identified. Extensive Calcified aortic atherosclerosis. Left chest Port-A-Cath. Extensive calcified coronary artery atherosclerosis. Heart size remains normal. No pericardial effusion. Mediastinum/Nodes: Small but increased pre-vascular left superior mediastinal lymph nodes compared to April PET-CT, now up to 10 mm series 4 image  34. Confluent soft tissue at the right hilum appears stable since that time. Lungs/Pleura: Left apical lung mass with enhancing internal vascularity is now 5.9  cm long axis on axial series 6, image 28, versus 4-5 cm on 07/09/2023. Increased surrounding left upper lobe opacity and volume loss in that lobe. Reticulonodular changes now throughout the left hilum, may be treatment related. Contralateral confluent right suprahilar opacity with air bronchograms appears more stable since April. Superimposed right infrahilar lung mass which was 2.4 cm at that time is now less distinct on series 6, image 65 and appears smaller. However, progressive stenosis and now occlusion of the right bronchus intermedius on series 6, image 65. Downstream opacified airways. Associated Right lower lobe confluent peribronchial opacity, airway thickening and opacification, and basilar segment airspace disease maximal in the posterior basilar segment is new since April. Associated small right pleural effusion is new. Left lower lobe appears hyperinflated. Left lower lobe lateral basal segment lung nodule measuring 8-9 mm has not definitely changed from prior PET-CT when allowing for respiratory motion previously. No left pleural effusion. Upper Abdomen: Negative visible early arterial enhanced appearance of the upper abdomen. Musculoskeletal: Stable visualized osseous structures. Review of the MIP images confirms the above findings. IMPRESSION: 1. Negative for acute pulmonary embolus. 2. Bilateral Lung Cancer. Superimposed post treatment changes since April PET-CT, although progression of the left upper lobe tumor is not excluded. And furthermore, new occlusion of the right bronchus intermedius, indeterminate for secretions/versus tumor, and extensive new right lower lobe airway thickening, opacification, and basilar segment airspace disease. Consider Aspiration versus Postobstructive Pneumonia. Small associated layering right pleural effusion. 3. Advanced coronary artery, Aortic Atherosclerosis (ICD10-I70.0). Electronically Signed   By: VEAR Hurst M.D.   On: 10/10/2023 12:08   CT Cervical Spine Wo  Contrast Result Date: 10/10/2023 CLINICAL DATA:  78 year old male recently hospitalized. Sepsis. Fall, found hypoxic. Hypotensive. Pain. Lung cancer. EXAM: CT CERVICAL SPINE WITHOUT CONTRAST TECHNIQUE: Multidetector CT imaging of the cervical spine was performed without intravenous contrast. Multiplanar CT image reconstructions were also generated. RADIATION DOSE REDUCTION: This exam was performed according to the departmental dose-optimization program which includes automated exposure control, adjustment of the mA and/or kV according to patient size and/or use of iterative reconstruction technique. COMPARISON:  Head and chest CT today.  Cervical spine CT 08/25/2021. FINDINGS: Alignment: Stable straightening of lordosis since 2023. Stable cervicothoracic junction and posterior element alignment. Skull base and vertebrae: Stable bone mineralization. Visualized skull base is intact. No atlanto-occipital dissociation. C1 and C2 are chronically degenerated anteriorly but appear intact and aligned. No acute osseous abnormality identified. Soft tissues and spinal canal: No prevertebral fluid or swelling. No visible canal hematoma. Severe calcified proximal right ICA atherosclerosis, similar to 2023. Disc levels: Advanced chronic cervical spine degeneration appears stable since 2023. Upper chest: Abnormal, Chest CTA today reported separately. IMPRESSION: 1. No acute traumatic injury identified in the cervical spine. 2. Advanced chronic cervical spine degeneration. 3. Chronic Severe Right ICA origin calcified atherosclerosis. 4. Abnormal left upper chest, See CTA reported separately. Electronically Signed   By: VEAR Hurst M.D.   On: 10/10/2023 11:59   CT Head Wo Contrast Result Date: 10/10/2023 CLINICAL DATA:  78 year old male recently hospitalized. Sepsis. Fall, found hypoxic. Hypotensive. Pain. Lung cancer. EXAM: CT HEAD WITHOUT CONTRAST TECHNIQUE: Contiguous axial images were obtained from the base of the skull through  the vertex without intravenous contrast. RADIATION DOSE REDUCTION: This exam was performed according to the departmental dose-optimization program which includes automated exposure control,  adjustment of the mA and/or kV according to patient size and/or use of iterative reconstruction technique. COMPARISON:  Staging brain MRI 07/22/2023.  Head CT 01/28/2022. FINDINGS: Brain: Stable cerebral volume. No midline shift, ventriculomegaly, mass effect, evidence of mass lesion, intracranial hemorrhage or evidence of cortically based acute infarction. Patchy bilateral cerebral white matter hypodensity appears stable from the MRI last month. Vascular: Calcified atherosclerosis at the skull base. No suspicious intracranial vascular hyperdensity. Skull: Stable and intact. Sinuses/Orbits: Visualized paranasal sinuses and mastoids are stable and well aerated. Other: No acute orbit or scalp soft tissue injury identified. Retained secretions in the nasopharynx. IMPRESSION: 1. No acute intracranial abnormality or acute traumatic injury identified. 2. Stable non contrast CT appearance of chronic small vessel disease. Electronically Signed   By: VEAR Hurst M.D.   On: 10/10/2023 11:46     .Critical Care  Performed by: Daralene Lonni BIRCH, PA-C Authorized by: Daralene Lonni BIRCH, PA-C   Critical care provider statement:    Critical care time (minutes):  65   Critical care was necessary to treat or prevent imminent or life-threatening deterioration of the following conditions:  Sepsis and respiratory failure   Critical care was time spent personally by me on the following activities:  Development of treatment plan with patient or surrogate, discussions with consultants, evaluation of patient's response to treatment, examination of patient, ordering and review of laboratory studies, ordering and review of radiographic studies, ordering and performing treatments and interventions, pulse oximetry, re-evaluation of patient's  condition and review of old charts   I assumed direction of critical care for this patient from another provider in my specialty: no     Care discussed with: admitting provider      Medications Ordered in the ED  azithromycin  (ZITHROMAX ) 500 mg in sodium chloride  0.9 % 250 mL IVPB (500 mg Intravenous New Bag/Given 10/10/23 1103)  lactated ringers  infusion (has no administration in time range)  cefTRIAXone  (ROCEPHIN ) 1 g in sodium chloride  0.9 % 100 mL IVPB (0 g Intravenous Stopped 10/10/23 1242)  lactated ringers  bolus 1,000 mL (0 mLs Intravenous Stopped 10/10/23 1242)  ipratropium-albuterol  (DUONEB) 0.5-2.5 (3) MG/3ML nebulizer solution 3 mL (3 mLs Nebulization Given 10/10/23 1037)  iohexol  (OMNIPAQUE ) 350 MG/ML injection 60 mL (60 mLs Intravenous Contrast Given 10/10/23 1121)  lactated ringers  bolus 1,000 mL (1,000 mLs Intravenous New Bag/Given 10/10/23 1253)                                    Medical Decision Making Amount and/or Complexity of Data Reviewed Labs: ordered. Radiology: ordered.  Risk Prescription drug management. Decision regarding hospitalization.   This patient presents to the ED for concern of dyspnea, this involves an extensive number of treatment options, and is a complaint that carries with it a high risk of complications and morbidity.  The differential diagnosis includes sepsis, pneumonia, CHF, COPD exacerbation, ACS, pulmonary embolus   Co morbidities that complicate the patient evaluation  COPD, CHF, lung cancer   Additional history obtained:  Additional history obtained from medical records External records from outside source obtained and reviewed including medical records   Lab Tests:  I Ordered, and personally interpreted labs.  The pertinent results include: Leukocytosis, anemia, elevated creatinine, normal liver enzymes, normal electrolytes, elevated troponin, elevated BNP, normal lactic acid, unremarkable urinalysis, VBG with no  hypercarbia   Imaging Studies ordered:  I ordered imaging studies including CT scan of head,  cervical spine, CTA chest, bilateral hip x-rays, forearm x-ray I independently visualized and interpreted imaging which showed no acute intracranial hemorrhage, no cervical spine fracture, CTA chest demonstrates no pulmonary embolus, occlusion of right bronchus, no fracture noted to bilateral hips right forearm I agree with the radiologist interpretation   Cardiac Monitoring: / EKG:  The patient was maintained on a cardiac monitor.  I personally viewed and interpreted the cardiac monitored which showed an underlying rhythm of: Sinus tachycardia, no ST/T wave changes, no ischemic changes, no STEMI   Consultations Obtained:  I requested consultation with the intensivist, Dr. Kara,  and discussed lab and imaging findings as well as pertinent plan - they recommend: Admission to ICU   Problem List / ED Course / Critical interventions / Medication management  Patient does remain stable at this point.  Patient was initially placed on BiPAP secondary to hypoxia and respiratory distress.  Vital signs and oxygenation greatly improved the patient became less responsive.  Repeat VBG was checked which demonstrated no hypercarbia.  Decision was made at that time after discussing case with intensivist and family for intubation.  Patient was intubated by attending physician Dr. Zammit.  He has been treated with antibiotics for apparent pneumonia and sepsis.  Anemia is at his baseline at this time.  CT scan of the head demonstrated no signs of intracranial hemorrhage.  Patient has no indication of urinary tract infection at this point.  Did add on additional antibiotics to include Zosyn  and vancomycin  to cover for hospital-acquired pneumonia.  Patient does have worsening of his kidney function as well.  He has been given IV fluids in the emergency department. I ordered medication including IV fluids, vancomycin ,  Zosyn , Rocephin , azithromycin , DuoNeb for sepsis, pneumonia Reevaluation of the patient after these medicines showed that the patient improved I have reviewed the patients home medicines and have made adjustments as needed   Social Determinants of Health:  None   Test / Admission - Considered:  Admission     Final diagnoses:  Pneumonia of right lower lobe due to infectious organism  Sepsis, due to unspecified organism, unspecified whether acute organ dysfunction present [facility])  Acute respiratory failure with hypoxia [facility])    ED Discharge Orders     None          Daralene Lonni BIRCH, PA-C 10/10/23 1505    Suzette Pac, MD 10/11/23 [phone]

## 2023-10-10 NOTE — Progress Notes (Signed)
 Pharmacy Antibiotic Note  John [name] is a 78 y.o. male admitted on 10/10/2023 with pneumonia and sepsis.  Pharmacy has been consulted for vanc/cefepime  dosing.  Plan: Vanc 1g x 1 already given. Start Vanc 1250mg  IV q36 thereafter - goal AUC 400-550 Cefepime  2g IV q12  Height: 5' 6 (167.6 cm) Weight: 69.2 kg (152 lb 8.9 oz) IBW/kg (Calculated) : 63.8  Temp (24hrs), Avg:97.7 F (36.5 C), Min:96.8 F (36 C), Max:98.5 F (36.9 C)  Recent Labs  Lab 10/06/23 0758 10/10/23 1011 10/10/23 1224  WBC 21.3* 17.7*  --   CREATININE 1.44* 1.60*  --   LATICACIDVEN  --  1.5 1.8    Estimated Creatinine Clearance: 34.9 mL/min (A) (by C-G formula based on SCr of 1.6 mg/dL (H)).    Allergies  Allergen Reactions   Benadryl  [Diphenhydramine ] Other (See Comments)    Extreme leg pain and extreme somnolent      Bisoprolol  Other (See Comments)    Confusion, delirium    Sulfa  Antibiotics Swelling and Other (See Comments)    Mouth swelling   Atorvastatin Other (See Comments)    Muscle aches - tolerating Pravastatin  40 mg MWF   Jardiance  [Empagliflozin ] Other (See Comments)    FEELS SLUGGISH, TIRED   Lopressor  [Metoprolol ] Other (See Comments)    Fatigue   Rosuvastatin Other (See Comments)    Muscle aches - tolerating Pravastatin  40 mg MWF   Doxycycline  Nausea Only   Temazepam  Other (See Comments)    Made insomnia worse    Tramadol  Itching      Thank you for allowing pharmacy to be a part of this patient's care.  John [name] 10/10/2023 6:43 PM

## 2023-10-10 NOTE — ED Notes (Signed)
 ED Provider at bedside. Aware of patient condition advises for VBG.

## 2023-10-11 ENCOUNTER — Inpatient Hospital Stay (HOSPITAL_COMMUNITY)

## 2023-10-11 DIAGNOSIS — J9621 Acute and chronic respiratory failure with hypoxia: Secondary | ICD-10-CM | POA: Diagnosis not present

## 2023-10-11 DIAGNOSIS — J9622 Acute and chronic respiratory failure with hypercapnia: Secondary | ICD-10-CM | POA: Diagnosis not present

## 2023-10-11 DIAGNOSIS — G9341 Metabolic encephalopathy: Secondary | ICD-10-CM

## 2023-10-11 DIAGNOSIS — A419 Sepsis, unspecified organism: Secondary | ICD-10-CM

## 2023-10-11 DIAGNOSIS — D638 Anemia in other chronic diseases classified elsewhere: Secondary | ICD-10-CM | POA: Diagnosis not present

## 2023-10-11 DIAGNOSIS — J441 Chronic obstructive pulmonary disease with (acute) exacerbation: Secondary | ICD-10-CM | POA: Diagnosis not present

## 2023-10-11 LAB — COMPREHENSIVE METABOLIC PANEL WITH GFR
ALT: 10 U/L (ref 0–44)
AST: 12 U/L — ABNORMAL LOW (ref 15–41)
Albumin: 1.8 g/dL — ABNORMAL LOW (ref 3.5–5.0)
Alkaline Phosphatase: 71 U/L (ref 38–126)
Anion gap: 8 (ref 5–15)
BUN: 19 mg/dL (ref 8–23)
CO2: 26 mmol/L (ref 22–32)
Calcium: 8.6 mg/dL — ABNORMAL LOW (ref 8.9–10.3)
Chloride: 101 mmol/L (ref 98–111)
Creatinine, Ser: 0.79 mg/dL (ref 0.61–1.24)
GFR, Estimated: >60 mL/min (ref >60)
Glucose, Bld: 150 mg/dL — ABNORMAL HIGH (ref 70–99)
Potassium: 4.6 mmol/L (ref 3.5–5.1)
Sodium: 135 mmol/L (ref 135–145)
Total Bilirubin: 0.6 mg/dL (ref 0.0–1.2)
Total Protein: 6.4 g/dL — ABNORMAL LOW (ref 6.5–8.1)

## 2023-10-11 LAB — CBC
HCT: 28.8 % — ABNORMAL LOW (ref 39.0–52.0)
Hemoglobin: 8.9 g/dL — ABNORMAL LOW (ref 13.0–17.0)
MCH: 33.1 pg (ref 26.0–34.0)
MCHC: 30.9 g/dL (ref 30.0–36.0)
MCV: 107.1 fL — ABNORMAL HIGH (ref 80.0–100.0)
Platelets: 413 K/uL — ABNORMAL HIGH (ref 150–400)
RBC: 2.69 MIL/uL — ABNORMAL LOW (ref 4.22–5.81)
RDW: 18.1 % — ABNORMAL HIGH (ref 11.5–15.5)
WBC: 10.5 K/uL (ref 4.0–10.5)
nRBC: 0 % (ref 0.0–0.2)

## 2023-10-11 LAB — GLUCOSE, CAPILLARY
Glucose-Capillary: 134 mg/dL — ABNORMAL HIGH (ref 70–99)
Glucose-Capillary: 137 mg/dL — ABNORMAL HIGH (ref 70–99)
Glucose-Capillary: 145 mg/dL — ABNORMAL HIGH (ref 70–99)
Glucose-Capillary: 149 mg/dL — ABNORMAL HIGH (ref 70–99)
Glucose-Capillary: 151 mg/dL — ABNORMAL HIGH (ref 70–99)
Glucose-Capillary: 154 mg/dL — ABNORMAL HIGH (ref 70–99)
Glucose-Capillary: 165 mg/dL — ABNORMAL HIGH (ref 70–99)

## 2023-10-11 LAB — URINE CULTURE: Culture: NO GROWTH

## 2023-10-11 LAB — BLOOD GAS, ARTERIAL
Acid-Base Excess: 2.3 mmol/L — ABNORMAL HIGH (ref 0.0–2.0)
Bicarbonate: 27.8 mmol/L (ref 20.0–28.0)
Drawn by: 11249
FIO2: 35 %
MECHVT: 510 mL
O2 Saturation: 98.4 %
PEEP: 5 cmH2O
Patient temperature: 36.5
RATE: 16 {breaths}/min
pCO2 arterial: 46 mmHg (ref 32–48)
pH, Arterial: 7.39 (ref 7.35–7.45)
pO2, Arterial: 79 mmHg — ABNORMAL LOW (ref 83–108)

## 2023-10-11 LAB — TRIGLYCERIDES: Triglycerides: 236 mg/dL — ABNORMAL HIGH (ref <150)

## 2023-10-11 LAB — MRSA NEXT GEN BY PCR, NASAL: MRSA by PCR Next Gen: DETECTED — AB

## 2023-10-11 LAB — PHOSPHORUS: Phosphorus: 4.5 mg/dL (ref 2.5–4.6)

## 2023-10-11 LAB — MAGNESIUM: Magnesium: 2.2 mg/dL (ref 1.7–2.4)

## 2023-10-11 MED ORDER — PANTOPRAZOLE SODIUM 40 MG IV SOLR
40.0000 mg | Freq: Every day | INTRAVENOUS | Status: DC
  Administered 2023-10-11 – 2023-10-18 (×8): 40 mg via INTRAVENOUS
  Filled 2023-10-11 (×8): qty 10

## 2023-10-11 MED ORDER — VITAL HP 1.0 CAL PO LIQD
1000.0000 mL | ORAL | Status: DC
  Administered 2023-10-11 – 2023-10-12 (×3): 1000 mL

## 2023-10-11 MED ORDER — PROSOURCE TF20 ENFIT COMPATIBL EN LIQD
60.0000 mL | Freq: Every day | ENTERAL | Status: DC
Start: 2023-10-11 — End: 2023-10-12
  Administered 2023-10-11 – 2023-10-12 (×2): 60 mL
  Filled 2023-10-11 (×2): qty 60

## 2023-10-11 MED ORDER — INSULIN ASPART 100 UNIT/ML IJ SOLN
0.0000 [IU] | INTRAMUSCULAR | Status: DC
  Administered 2023-10-11: 2 [IU] via SUBCUTANEOUS
  Administered 2023-10-12: 3 [IU] via SUBCUTANEOUS
  Administered 2023-10-12: 5 [IU] via SUBCUTANEOUS
  Administered 2023-10-12 (×2): 3 [IU] via SUBCUTANEOUS
  Administered 2023-10-13: 8 [IU] via SUBCUTANEOUS
  Administered 2023-10-13: 5 [IU] via SUBCUTANEOUS

## 2023-10-11 MED ORDER — SODIUM CHLORIDE 0.9 % IV SOLN
2.0000 g | Freq: Three times a day (TID) | INTRAVENOUS | Status: DC
  Administered 2023-10-11 – 2023-10-13 (×6): 2 g via INTRAVENOUS
  Filled 2023-10-11 (×6): qty 12.5

## 2023-10-11 MED ORDER — MUPIROCIN 2 % EX OINT
1.0000 | TOPICAL_OINTMENT | Freq: Two times a day (BID) | CUTANEOUS | Status: AC
  Administered 2023-10-11 – 2023-10-15 (×10): 1 via NASAL
  Filled 2023-10-11: qty 22

## 2023-10-11 MED ORDER — VANCOMYCIN HCL 1500 MG/300ML IV SOLN
1500.0000 mg | INTRAVENOUS | Status: AC
  Administered 2023-10-11 – 2023-10-15 (×5): 1500 mg via INTRAVENOUS
  Filled 2023-10-11 (×5): qty 300

## 2023-10-11 NOTE — Plan of Care (Signed)
 Pt intubated and sedated- unable to educate Problem: Education: Goal: Knowledge of General Education information will improve Description: Including pain rating scale, medication(s)/side effects and non-pharmacologic comfort measures Outcome: Not Applicable   Problem: Health Behavior/Discharge Planning: Goal: Ability to manage health-related needs will improve Outcome: Not Applicable   Problem: Clinical Measurements: Goal: Ability to maintain clinical measurements within normal limits will improve Outcome: Not Applicable Goal: Will remain free from infection Outcome: Not Applicable Goal: Diagnostic test results will improve Outcome: Not Applicable Goal: Respiratory complications will improve Outcome: Not Applicable Goal: Cardiovascular complication will be avoided Outcome: Not Applicable   Problem: Activity: Goal: Risk for activity intolerance will decrease Outcome: Not Applicable   Problem: Nutrition: Goal: Adequate nutrition will be maintained Outcome: Not Applicable   Problem: Coping: Goal: Level of anxiety will decrease Outcome: Not Applicable   Problem: Elimination: Goal: Will not experience complications related to bowel motility Outcome: Not Applicable Goal: Will not experience complications related to urinary retention Outcome: Not Applicable   Problem: Pain Managment: Goal: General experience of comfort will improve and/or be controlled Outcome: Not Applicable   Problem: Safety: Goal: Ability to remain free from injury will improve Outcome: Not Applicable   Problem: Skin Integrity: Goal: Risk for impaired skin integrity will decrease Outcome: Not Applicable   Problem: Safety: Goal: Non-violent Restraint(s) Outcome: Not Applicable

## 2023-10-11 NOTE — Progress Notes (Addendum)
 NAME:  John [name], MRN:  [id number], DOB:  Oct 01, 1945, LOS: 1 ADMISSION DATE:  10/10/2023, CONSULTATION DATE: 10/10/2023 REFERRING MD: Fairy Sermon, MD, CHIEF COMPLAINT: Acute respiratory failure  History of Present Illness:   78 year old with history of adenocarcinoma of the lung and squamous cell carcinoma, COPD, asthma, OSA chronic respiratory failure on home oxygen , a flutter, CHF.  Recently admitted to [facility] ER for sepsis, hypoxic respiratory failure secondary to pneumonia, AKI.  Patient wanted an early discharge and sent home on 7/4 but returned to the emergency room today with generalized weakness, fall overnight.  Maintained on BiPAP, noted to have slowly downtrending hemoglobin which is being monitored.  Was altered in the ED.  Given ceftriaxone , azithromycin , Vanco, Zosyn , LR and intubated.  Transferred to ICU for further management.  Patient patient has an history of stage III A adenocarcinoma diagnosed in 2018 treated with radiation, carboplatin , paclitaxel  and durvalumab .  Unfortunately he continued to smoke and PET scan in March 2025 showed recurrence in bilateral upper and lower lobes which was confirmed as stage IV squamous cell cancer stage by bronchoscopic biopsy in April 2025.  He got 1 round of chemotherapy but did not tolerate anymore and was placed on nivolumab  infusion on 7/1.  Pertinent  Medical History    has a past medical history of Adenocarcinoma of lung, right (HCC) (04/18/2016), Anxiety, Arthritis, Asthma, Atrial flutter (HCC), BPH (benign prostatic hyperplasia), CHF (congestive heart failure) (HCC), COPD (chronic obstructive pulmonary disease) (HCC), Depression, Diabetes mellitus, type II (HCC), Dyspnea, GERD (gastroesophageal reflux disease), History of blood transfusion, History of kidney stones, History of radiation therapy, History of radiation therapy, Hyperlipidemia, Hypertension, Hypothyroidism, IDA (iron  deficiency anemia), Macular degeneration, Neuropathy,  Non-small cell lung cancer, right (HCC) (04/18/2016), Oxygen  dependent, PAD (peripheral artery disease) (HCC) (03/2023), Peripheral vascular disease (HCC), Prostatitis, Pulmonary nodule, left (07/16/2016), and Sleep apnea (2019).   Significant Hospital Events: Including procedures, antibiotic start and stop dates in addition to other pertinent events   7/1 - 10/09/2023 admitted to [facility] 10/10/2023 [name] ED to [facility], intubated for acute hypoxic respiratory failure  Interim History / Subjective:   Remains on the ventilator.  Off pressors today morning  Objective    Blood pressure (!) 120/55, pulse 85, temperature 97.6 F (36.4 C), temperature source Oral, resp. rate 16, height 5' 6 (1.676 m), weight 71.1 kg, SpO2 97%.    Vent Mode: PRVC FiO2 (%):  [35 %-40 %] 35 % Set Rate:  [16 bmp] 16 bmp Vt Set:  [450 mL-510 mL] 510 mL PEEP:  [5 cmH20] 5 cmH20 Plateau Pressure:  [15 cmH20-18 cmH20] 18 cmH20   Intake/Output Summary (Last 24 hours) at 10/11/2023 0751 Last data filed at 10/11/2023 0644 Gross per 24 hour  Intake 3348.56 ml  Output 925 ml  Net 2423.56 ml   Filed Weights   10/10/23 1824 10/11/23 0500  Weight: 69.2 kg 71.1 kg    Examination:. Gen:      No acute distress HEENT:  EOMI, sclera anicteric, ETT Neck:     No masses; no thyromegaly Lungs:    Clear to auscultation bilaterally; normal respiratory effort CV:         Regular rate and rhythm; no murmurs Abd:      + bowel sounds; soft, non-tender; no palpable masses, no distension Ext:    No edema; adequate peripheral perfusion Neuro: Sedated  Lab/imaging reviewed Metabolic panel stable, hemoglobin 8.9  CTA with no PE, bilateral opacities consistent with  known lung cancer, basilar airspace disease, postobstructive pneumonia CT head, spine with no acute process. Chest x-rays forearm and hip with no fractures  Resolved problem list   Assessment and Plan  Acute on chronic  hypoxic, hypercarbic respiratory failure Sepsis present on admission Secondary to COPD exacerbation, hospital-acquired pneumonia, recurrent lung cancer Continue vent support, follow chest x-ray, ABG Broad antibiotic coverage with Vanco, cefepime  Follow cultures Less concern for nivolumab  induced pneumonitis as he got only 1 dose.  Nonetheless we will start him on some steroids  Acute metabolic encephalopathy due to sepsis, respiratory failure Has rhythmic motions of the legs while sedation is being weaned.  CT head on admission is negative Get EEG   Recurrent lung cancer Unable to tolerate chemotherapy with no plans on resuming.  Nivolumab  is on hold  Anemia of chronic illness Declining hemoglobin noted at [facility]  No evidence of active bleed Monitor CBC and transfuse for hemoglobin less than 7  AKI Monitor urine output and creatinine  Chronic diastolic heart failure Hold outpatient cardiac medications and diuretics  Decreased appetite, weight loss, failure to thrive Malnutrition, present on admission Will get nutrition involved to assess status.  Best Practice (right click and Reselect all SmartList Selections daily)   Diet/type: NPO DVT prophylaxis prophylactic heparin   Pressure ulcer(s): N/A GI prophylaxis: PPI Lines: N/A Foley:  N/A Code Status:  full code Last date of multidisciplinary goals of care discussion []  Significant other Dagoberto updated over telephone 10/11/2023  Critical care time:    The patient is critically ill with multiple organ system failure and requires high complexity decision making for assessment and support, frequent evaluation and titration of therapies, advanced monitoring, review of radiographic studies and interpretation of complex data.   Critical Care Time devoted to patient care services, exclusive of separately billable procedures, described in this note is 35 minutes.   Oryon Gary MD [place] [facility] See Amion for  pager  If no response to pager , please call [phone] until 7pm After 7:00 pm call Elink  [phone] 10/11/2023, 8:00 AM

## 2023-10-11 NOTE — Progress Notes (Signed)
 Ceribell headband applied & recording initiated, skin intact prior to application.

## 2023-10-11 NOTE — TOC Progression Note (Signed)
 Transition of Care [facility]) - Progression Note    Patient Details  Name: John [name] MRN: [id number] Date of Birth: March 20, 1946  [facility]) CM/SW Contact  Sonda Manuella Quill, RN Phone Number: 10/11/2023, 4:36 PM  Clinical Narrative:    Pt sedated on ventilator; no family at bedside; unable to complete TOC assessment.        Expected Discharge Plan and Services                                               Social Determinants of Health (SDOH) Interventions SDOH Screenings   Food Insecurity: Patient Unable To Answer (10/11/2023)  Housing: Patient Unable To Answer (10/11/2023)  Transportation Needs: Patient Unable To Answer (10/11/2023)  Utilities: Patient Unable To Answer (10/11/2023)  Alcohol Screen: Low Risk  (05/30/2023)  Depression (PHQ2-9): Low Risk  (10/06/2023)  Financial Resource Strain: Low Risk  (10/07/2023)   Received from [facility]  Physical Activity: Inactive (10/07/2023)   Received from [facility]  Social Connections: Patient Unable To Answer (10/11/2023)  Recent Concern: Social Connections - Moderately Isolated (10/07/2023)   Received from [facility]  Stress: Stress Concern Present (10/07/2023)   Received from [facility]  Tobacco Use: High Risk (10/07/2023)   Received from [facility]  Health Literacy: Medium Risk (10/07/2023)   Received from [facility]    Readmission Risk Interventions    08/27/2021   11:44 AM  Readmission Risk Prevention Plan  Transportation Screening Complete  PCP or Specialist Appt within 3-5 Days Complete  HRI or Home Care Consult Complete  Social Work Consult for Recovery Care Planning/Counseling Complete  Palliative Care Screening Not Applicable  Medication Review [employer]) Complete

## 2023-10-11 NOTE — Progress Notes (Signed)
 PHARMACY NOTE:  ANTIMICROBIAL RENAL DOSAGE ADJUSTMENT  Current antimicrobial regimen includes a mismatch between antimicrobial dosage and estimated renal function.  As per policy approved by the Pharmacy & Therapeutics and Medical Executive Committees, the antimicrobial dosage will be adjusted accordingly.  Current antimicrobial dosage:  Cefepime  2g IV q12h, Vancomycin  1250mg  IV q36h  Indication: HAP  Renal Function:  SCr decreased 1.6 > 0.79 Estimated Creatinine Clearance: 69.8 mL/min (by C-G formula based on SCr of 0.79 mg/dL).    Antimicrobial dosage has been changed to:  Cefepime  2g q8, Vanc 1500mg  q24 (SCr 0.8, eAUC 470)  Thank you for allowing pharmacy to be a part of this patient's care.  Wanda Hasting PharmD, BCPS [facility] main pharmacy [phone] 10/11/2023 11:27 AM

## 2023-10-11 NOTE — Plan of Care (Signed)
  Problem: Clinical Measurements: Goal: Respiratory complications will improve Outcome: Not Progressing Goal: Cardiovascular complication will be avoided Outcome: Not Progressing   Problem: Nutrition: Goal: Adequate nutrition will be maintained Outcome: Not Progressing   Problem: Pain Managment: Goal: General experience of comfort will improve and/or be controlled Outcome: Not Progressing

## 2023-10-12 ENCOUNTER — Inpatient Hospital Stay (HOSPITAL_COMMUNITY)
Admit: 2023-10-12 | Discharge: 2023-10-12 | Disposition: A | Discharge status: Home / Self Care | Attending: Pulmonary Disease

## 2023-10-12 ENCOUNTER — Other Ambulatory Visit: Payer: Self-pay

## 2023-10-12 DIAGNOSIS — R569 Unspecified convulsions: Secondary | ICD-10-CM

## 2023-10-12 DIAGNOSIS — A419 Sepsis, unspecified organism: Secondary | ICD-10-CM | POA: Diagnosis not present

## 2023-10-12 DIAGNOSIS — E440 Moderate protein-calorie malnutrition: Secondary | ICD-10-CM | POA: Insufficient documentation

## 2023-10-12 DIAGNOSIS — J441 Chronic obstructive pulmonary disease with (acute) exacerbation: Secondary | ICD-10-CM | POA: Diagnosis not present

## 2023-10-12 DIAGNOSIS — I10 Essential (primary) hypertension: Secondary | ICD-10-CM

## 2023-10-12 DIAGNOSIS — J9621 Acute and chronic respiratory failure with hypoxia: Secondary | ICD-10-CM | POA: Diagnosis not present

## 2023-10-12 DIAGNOSIS — R4182 Altered mental status, unspecified: Secondary | ICD-10-CM | POA: Diagnosis not present

## 2023-10-12 DIAGNOSIS — J9622 Acute and chronic respiratory failure with hypercapnia: Secondary | ICD-10-CM | POA: Diagnosis not present

## 2023-10-12 LAB — BASIC METABOLIC PANEL WITH GFR
Anion gap: 9 (ref 5–15)
BUN: 26 mg/dL — ABNORMAL HIGH (ref 8–23)
CO2: 24 mmol/L (ref 22–32)
Calcium: 8.5 mg/dL — ABNORMAL LOW (ref 8.9–10.3)
Chloride: 101 mmol/L (ref 98–111)
Creatinine, Ser: 0.89 mg/dL (ref 0.61–1.24)
GFR, Estimated: >60 mL/min (ref >60)
Glucose, Bld: 182 mg/dL — ABNORMAL HIGH (ref 70–99)
Potassium: 4.2 mmol/L (ref 3.5–5.1)
Sodium: 134 mmol/L — ABNORMAL LOW (ref 135–145)

## 2023-10-12 LAB — CBC
HCT: 26.9 % — ABNORMAL LOW (ref 39.0–52.0)
Hemoglobin: 8.1 g/dL — ABNORMAL LOW (ref 13.0–17.0)
MCH: 32.8 pg (ref 26.0–34.0)
MCHC: 30.1 g/dL (ref 30.0–36.0)
MCV: 108.9 fL — ABNORMAL HIGH (ref 80.0–100.0)
Platelets: 444 K/uL — ABNORMAL HIGH (ref 150–400)
RBC: 2.47 MIL/uL — ABNORMAL LOW (ref 4.22–5.81)
RDW: 17.5 % — ABNORMAL HIGH (ref 11.5–15.5)
WBC: 13.2 K/uL — ABNORMAL HIGH (ref 4.0–10.5)
nRBC: 0.2 % (ref 0.0–0.2)

## 2023-10-12 LAB — PHOSPHORUS
Phosphorus: 3.8 mg/dL (ref 2.5–4.6)
Phosphorus: 3.1 mg/dL (ref 2.5–4.6)

## 2023-10-12 LAB — CULTURE, RESPIRATORY W GRAM STAIN: Culture: NORMAL

## 2023-10-12 LAB — GLUCOSE, CAPILLARY
Glucose-Capillary: 92 mg/dL (ref 70–99)
Glucose-Capillary: 180 mg/dL — ABNORMAL HIGH (ref 70–99)
Glucose-Capillary: 193 mg/dL — ABNORMAL HIGH (ref 70–99)
Glucose-Capillary: 184 mg/dL — ABNORMAL HIGH (ref 70–99)
Glucose-Capillary: 213 mg/dL — ABNORMAL HIGH (ref 70–99)
Glucose-Capillary: 242 mg/dL — ABNORMAL HIGH (ref 70–99)

## 2023-10-12 LAB — CUP PACEART REMOTE DEVICE CHECK
Date Time Interrogation Session: 20250704134200
Pulse Gen Serial Number: 104324

## 2023-10-12 LAB — TRIGLYCERIDES: Triglycerides: 202 mg/dL — ABNORMAL HIGH (ref <150)

## 2023-10-12 LAB — MAGNESIUM
Magnesium: 2.2 mg/dL (ref 1.7–2.4)
Magnesium: 2.2 mg/dL (ref 1.7–2.4)

## 2023-10-12 LAB — TSH: TSH: 0.545 u[IU]/mL (ref 0.350–4.500)

## 2023-10-12 LAB — T4, FREE: Free T4: 0.82 ng/dL (ref 0.61–1.12)

## 2023-10-12 LAB — AMMONIA: Ammonia: 23 umol/L (ref 9–35)

## 2023-10-12 MED ORDER — GABAPENTIN 250 MG/5ML PO SOLN
300.0000 mg | Freq: Three times a day (TID) | ORAL | Status: DC
  Administered 2023-10-12 – 2023-10-18 (×18): 300 mg
  Filled 2023-10-12 (×19): qty 6

## 2023-10-12 MED ORDER — GABAPENTIN 300 MG PO CAPS: 300.0000 mg | ORAL_CAPSULE | Freq: Three times a day (TID) | ORAL | Status: DC

## 2023-10-12 MED ORDER — REVEFENACIN 175 MCG/3ML IN SOLN
175.0000 ug | Freq: Every day | RESPIRATORY_TRACT | Status: DC
  Administered 2023-10-12 – 2023-10-18 (×7): 175 ug via RESPIRATORY_TRACT
  Filled 2023-10-12 (×7): qty 3

## 2023-10-12 MED ORDER — CLOPIDOGREL BISULFATE 75 MG PO TABS
75.0000 mg | ORAL_TABLET | Freq: Every day | ORAL | Status: DC
  Administered 2023-10-12 – 2023-10-18 (×7): 75 mg
  Filled 2023-10-12 (×7): qty 1

## 2023-10-12 MED ORDER — BUDESONIDE 0.25 MG/2ML IN SUSP
0.2500 mg | Freq: Two times a day (BID) | RESPIRATORY_TRACT | Status: DC
  Administered 2023-10-12 – 2023-10-18 (×13): 0.25 mg via RESPIRATORY_TRACT
  Filled 2023-10-12 (×13): qty 2

## 2023-10-12 MED ORDER — AMLODIPINE BESYLATE 5 MG PO TABS
5.0000 mg | ORAL_TABLET | Freq: Once | ORAL | Status: AC | PRN
  Administered 2023-10-12: 5 mg via NASOGASTRIC
  Filled 2023-10-12: qty 1

## 2023-10-12 MED ORDER — LEVOTHYROXINE SODIUM 25 MCG PO TABS
50.0000 ug | ORAL_TABLET | Freq: Every day | ORAL | Status: DC
  Administered 2023-10-13 – 2023-10-18 (×6): 50 ug
  Filled 2023-10-12 (×6): qty 2

## 2023-10-12 MED ORDER — VITAL AF 1.2 CAL PO LIQD
1000.0000 mL | ORAL | Status: DC
  Administered 2023-10-12 – 2023-10-15 (×5): 1000 mL

## 2023-10-12 MED ORDER — DEXMEDETOMIDINE HCL IN NACL 400 MCG/100ML IV SOLN
0.0000 ug/kg/h | INTRAVENOUS | Status: DC
  Administered 2023-10-12: 0.7 ug/kg/h via INTRAVENOUS
  Administered 2023-10-12: 0.4 ug/kg/h via INTRAVENOUS
  Administered 2023-10-13: 0.7 ug/kg/h via INTRAVENOUS
  Administered 2023-10-13: 0.6 ug/kg/h via INTRAVENOUS
  Administered 2023-10-13: 0.4 ug/kg/h via INTRAVENOUS
  Administered 2023-10-13: 0.8 ug/kg/h via INTRAVENOUS
  Administered 2023-10-14: 0.6 ug/kg/h via INTRAVENOUS
  Administered 2023-10-14 – 2023-10-15 (×2): 0.5 ug/kg/h via INTRAVENOUS
  Administered 2023-10-18: 0.1 ug/kg/h via INTRAVENOUS
  Filled 2023-10-12 (×9): qty 100

## 2023-10-12 MED ORDER — PRAVASTATIN SODIUM 20 MG PO TABS
40.0000 mg | ORAL_TABLET | ORAL | Status: DC
  Administered 2023-10-12 – 2023-10-16 (×3): 40 mg
  Filled 2023-10-12 (×3): qty 2

## 2023-10-12 MED ORDER — ALTEPLASE 2 MG IJ SOLR
2.0000 mg | Freq: Once | INTRAMUSCULAR | Status: AC
  Administered 2023-10-12: 2 mg
  Filled 2023-10-12: qty 2

## 2023-10-12 MED ORDER — ARFORMOTEROL TARTRATE 15 MCG/2ML IN NEBU
15.0000 ug | INHALATION_SOLUTION | Freq: Two times a day (BID) | RESPIRATORY_TRACT | Status: DC
  Administered 2023-10-12 – 2023-10-18 (×13): 15 ug via RESPIRATORY_TRACT
  Filled 2023-10-12 (×13): qty 2

## 2023-10-12 NOTE — Progress Notes (Signed)
 eLink Physician-Brief Progress Note Patient Name: John [name] DOB: October 17, 1945 MRN: [id number]   Date of Service  10/12/2023  HPI/Events of Note  Bedside RN calling regarding patient's BP. Patient w/ HTN- last BP 175/65. Currently no PRN's for BP. I do see Propanolol in the patient's home medications.  Pt previously was on Propofol  so I think it was maintaining the BP, but now that they are off of it they are more HTN. Per RN no signs that patient is uncomfortable.   eICU Interventions  Camera eval done. Discussed with RN, HR 80. Asking for restraints. On Ventilator. In synchrony. Has OG tube.  - amlodipine  5 mg prn via OG tube once and Bilateral soft wrist restraints request from bed side RN to prevent self injury and harm while critically ill  on vent ordered.      Intervention Category Intermediate Interventions: Hypertension - evaluation and management  [name] 10/12/2023, 8:36 PM  00:31 Bedside RN reports that she gave Amlodipine  at 2248 and patient still has BP of 176/64. She is requesting something PRN.  - hydralazine  prn ordered

## 2023-10-12 NOTE — Progress Notes (Addendum)
 Pt placed in SBT, tolerating well at this time.    10/12/23 1111  Adult Ventilator Settings  Vent Mode PSV;CPAP  FiO2 (%) 30 %  Pressure Support 5 cmH20  PEEP 5 cmH20  Adult Ventilator Measurements  Peak Airway Pressure 11 L/min  Mean Airway Pressure 6 cmH20  Resp Rate Spontaneous 15 br/min  Resp Rate Total 15 br/min  Spont TV 605 mL  Measured Ve 9.3 L  SpO2 95 %    Pt failed SBT after 1hr and due to low vE.

## 2023-10-12 NOTE — Procedures (Signed)
 Patient Name: John [name]  MRN: [id number]  Epilepsy Attending: Arlin MALVA Krebs  Referring Physician/Provider: Adolph Tinnie BRAVO, PA-C  Date: 10/12/2023 Duration: 24.55 mins  Patient history: 78yo M with ams. EEG to evaluate for seizure   Level of alertness: comatose   AEDs during EEG study: Propofol , GBP   Technical aspects: This EEG was obtained using a 10 lead EEG system positioned circumferentially without any parasagittal coverage (rapid EEG). Computer selected EEG is reviewed as  well as background features and all clinically significant events.   Description: EEG showed continuous generalized polymorphic sharply contoured 3 to 7 Hz theta-delta slowing. Hyperventilation and photic stimulation were not performed.      ABNORMALITY - Continuous slow, generalized   IMPRESSION: This EEG is suggestive of moderate to severe diffuse encephalopathy.  No seizures or epileptiform discharges were seen throughout the recording.  John [name]

## 2023-10-12 NOTE — Procedures (Addendum)
 Patient Name: John [name]  MRN: [id number]  Epilepsy Attending: Arlin MALVA Krebs  Referring Physician/Provider: Dr Lonna Coder Duration: 10/11/2023 1355 to 10/12/2023 0902  Patient history: 77yo M with ams. EEG to evaluate for seizure  Level of alertness: comatose  AEDs during EEG study: Propofol   Technical aspects: This EEG was obtained using a 10 lead EEG system positioned circumferentially without any parasagittal coverage (rapid EEG). Computer selected EEG is reviewed as  well as background features and all clinically significant events.  Description: EEG showed continuous generalized polymorphic sharply contoured 3 to 7 Hz theta-delta slowing. Hyperventilation and photic stimulation were not performed.     ABNORMALITY - Continuous slow, generalized  IMPRESSION: This limited ceribell EEG suggestive of moderate to severe diffuse encephalopathy.  No seizures or epileptiform discharges were seen throughout the recording.  If suspicion for ictal-interictal activity persist, please obtain EEG with video and full montage.  [name]

## 2023-10-12 NOTE — Plan of Care (Signed)
  Problem: Coping: Goal: Ability to adjust to condition or change in health will improve Outcome: Not Progressing   Problem: Metabolic: Goal: Ability to maintain appropriate glucose levels will improve Outcome: Not Progressing   Problem: Nutritional: Goal: Maintenance of adequate nutrition will improve Outcome: Not Progressing   Problem: Skin Integrity: Goal: Risk for impaired skin integrity will decrease Outcome: Not Progressing

## 2023-10-12 NOTE — Progress Notes (Signed)
 NAME:  John [name], MRN:  [id number], DOB:  13-Jun-1945, LOS: 2 ADMISSION DATE:  10/10/2023, CONSULTATION DATE: 10/10/2023 REFERRING MD: Fairy Sermon, MD, CHIEF COMPLAINT: Acute respiratory failure  History of Present Illness:  78 year old with history of adenocarcinoma of the lung and squamous cell carcinoma, COPD, asthma, OSA chronic respiratory failure on home oxygen , a flutter, CHF.  Recently admitted to [facility] ER for sepsis, hypoxic respiratory failure secondary to pneumonia, AKI.  Patient wanted an early discharge and sent home on 7/4 but returned to the emergency room today with generalized weakness, fall overnight.  Maintained on BiPAP, noted to have slowly downtrending hemoglobin which is being monitored.  Was altered in the ED.  Given ceftriaxone , azithromycin , Vanco, Zosyn , LR and intubated.  Transferred to ICU for further management.  Patient patient has an history of stage III A adenocarcinoma diagnosed in 2018 treated with radiation, carboplatin , paclitaxel  and durvalumab .  Unfortunately he continued to smoke and PET scan in March 2025 showed recurrence in bilateral upper and lower lobes which was confirmed as stage IV squamous cell cancer stage by bronchoscopic biopsy in April 2025.  He got 1 round of chemotherapy but did not tolerate anymore and was placed on nivolumab  infusion on 7/1.  Pertinent  Medical History    has a past medical history of Adenocarcinoma of lung, right (HCC) (04/18/2016), Anxiety, Arthritis, Asthma, Atrial flutter (HCC), BPH (benign prostatic hyperplasia), CHF (congestive heart failure) (HCC), COPD (chronic obstructive pulmonary disease) (HCC), Depression, Diabetes mellitus, type II (HCC), Dyspnea, GERD (gastroesophageal reflux disease), History of blood transfusion, History of kidney stones, History of radiation therapy, History of radiation therapy, Hyperlipidemia, Hypertension, Hypothyroidism, IDA (iron  deficiency anemia), Macular degeneration, Neuropathy,  Non-small cell lung cancer, right (HCC) (04/18/2016), Oxygen  dependent, PAD (peripheral artery disease) (HCC) (03/2023), Peripheral vascular disease (HCC), Prostatitis, Pulmonary nodule, left (07/16/2016), and Sleep apnea (2019).   Significant Hospital Events: Including procedures, antibiotic start and stop dates in addition to other pertinent events   7/1 - 10/09/2023 admitted to [facility] 7/5: transferred from [name] ED to [facility], intubated for acute hypoxic respiratory failure 7/6: off pressors   Interim History / Subjective:  NAEON. On minimal vent settings. Will switch to precedex  to wean off fent/prop. Pending EEG.   Objective    Blood pressure (!) 113/48, pulse 86, temperature 98.4 F (36.9 C), resp. rate 15, height 5' 6 (1.676 m), weight 72.2 kg, SpO2 95%.    Vent Mode: PRVC FiO2 (%):  [30 %-35 %] 30 % Set Rate:  [16 bmp] 16 bmp Vt Set:  [510 mL] 510 mL PEEP:  [5 cmH20] 5 cmH20 Plateau Pressure:  [14 cmH20-17 cmH20] 15 cmH20   Intake/Output Summary (Last 24 hours) at 10/12/2023 0737 Last data filed at 10/12/2023 9290 Gross per 24 hour  Intake 1785.8 ml  Output 1120 ml  Net 665.8 ml   Filed Weights   10/10/23 1824 10/11/23 0500 10/12/23 0200  Weight: 69.2 kg 71.1 kg 72.2 kg    Examination:. Gen: elderly male, laying in bed, critically ill appearing, nad  HEENT: ncat, anicteric sclera, perrla  Lungs: coarse, vented lung sounds bilaterally, vented on minimal settings CV:  s1s2, no m/r/g, no edema Abd: rounded, soft, ntnd Ext: no edema Neuro: Sedated, +gag/cough, does not wake to voice or painful stimulus, does not follow commands  CTA with no PE, bilateral opacities consistent with known lung cancer, basilar airspace disease, postobstructive pneumonia CT head, spine with no acute process. Chest x-rays forearm and hip  with no fractures  Resolved problem list   Assessment and Plan  Acute on chronic hypoxic, hypercarbic respiratory failure 2/2  COPD exacerbation, likely post-obstructive pneumonia, recurrent lung cancer  Sepsis, POA Received rocephin , azithromycin  at OSH. Intubated for hypoxic respiratory failure. CXR 7/6 with known masses. CT chest with masses, rather bulky lymphadenopathy, obstructive consolidation. Less concern for Nivolumab  induced pneumonitis as he received only 1 dose.  - continue vanc, cefepime   - continue steroids to cover pneumonitis anyway - methylpred 40 q12  - add triple therapy wit yupelri , brovana , pulmicort   - follow cultures, no growth  - full mechanical vent support - lung protective ventilation 6-8cc/kg Vt - VAP and PAD bundle in place  - titrate FiO2 to sat goal >92  - maintain peak/plats <30, driving pressures <84    Acute metabolic encephalopathy due to sepsis, respiratory failure Has rhythmic motions of the legs while sedation is being weaned.  CT head on admission is negative - ammonia check this morning negative  - f/u EEG results   Recurrent lung cancer Unable to tolerate chemotherapy with no plans on resuming.  Nivolumab  is on hold  Anemia of chronic illness Declining hemoglobin noted at [facility].  No evidence of active bleed - trend cbc  - transfuse <7   AKI, resolved - trend bmp, mag, phos - replete elytes - strict I&O - Avoid nephrotoxic agents, renally dose medications - ensure adequate renal perfusion   Chronic diastolic heart failure Hold outpatient meds including cardizem , torsemide , metolazone , spironolactone , propranolol   - can restart home plavix  today  HLD - start home pravastatin  40mg  MWF  Hypothyroid Home med includes synthroid  50mcg daily  - restart synthroid   - check TSH/T4   Decreased appetite, weight loss, failure to thrive Malnutrition, present on admission - tube feeds   Best Practice (right click and Reselect all SmartList Selections daily)   Diet/type: tubefeeds and NPO DVT prophylaxis DOAC GI prophylaxis: PPI Lines: N/A Foley:  Yes, and it is  still needed Code Status:  full code Last date of multidisciplinary goals of care discussion []  Significant other Dagoberto updated over telephone 10/11/2023  Critical care time: 65   The patient is critically ill with multiple organ system failure and requires high complexity decision making for assessment and support, frequent evaluation and titration of therapies, advanced monitoring, review of radiographic studies and interpretation of complex data.   Critical Care Time devoted to patient care services, exclusive of separately billable procedures, described in this note is 35 minutes.   Tinnie FORBES Furth, PA-C  Pulmonary & Critical Care 10/12/23 7:43 AM  Please see [email or web address] for pager details.  From 7A-7P if no response, please call [phone] After hours, please call ELink [phone]

## 2023-10-12 NOTE — Progress Notes (Signed)
 EEG complete - results pending

## 2023-10-12 NOTE — Progress Notes (Signed)
 Initial Nutrition Assessment  DOCUMENTATION CODES:   Non-severe (moderate) malnutrition in context of chronic illness  INTERVENTION:  - Tube feeding via OGT: Vital 1.2 at 60 ml/h (1440 ml per day) *Start at 57mL/hr and advance by 10mL Q12H Provides 1728 kcal, 108 gm protein, 1168 ml free water  daily  - Monitor magnesium , potassium, and phosphorus daily for at least 3 days, MD to replete as needed, as patient may be at risk for refeeding syndrome.   - FWF per CCM.   - Monitor weight trends.  NUTRITION DIAGNOSIS:   Moderate Malnutrition related to chronic illness as evidenced by mild fat depletion, moderate muscle depletion.   GOAL:   Patient will meet greater than or equal to 90% of their needs  MONITOR:   Vent status, Labs, Weight trends, TF tolerance  REASON FOR ASSESSMENT:   Consult Enteral/tube feeding initiation and management  ASSESSMENT:   78 year old with PMH of adenocarcinoma of the lung and squamous cell carcinoma, COPD, asthma, OSA chronic respiratory failure, CHF who was recently admitted for sepsis, hypoxic respiratory failure secondary to pneumonia, AKI and was discharged 7/4 but returned 7/5 with generalized weakness, fall overnight.   7/5 Admit; Intubated  Patient is currently intubated on ventilator support MV: 9.3 L/min Temp (24hrs), Avg:98.1 F (36.7 C), Min:97.5 F (36.4 C), Max:98.5 F (36.9 C)  OGT placed 7/5, xray verified in the stomach. No family at bedside at time of visit. TF's infusing at 16mL/hr. No noted intolerances. Per chart review, weight has fluctuated between 143-160# over the past year.  Will plan to switch formula, per discussion with CCM can begin advancing towards goal TF today.  Discussed advancement with RN.    Medications reviewed and include: Colace, Miralax , Protonix  Precedex   Labs reviewed:  Na 134 HA1C 7.9 Blood Glucose 92-180 x24 hours   NUTRITION - FOCUSED PHYSICAL EXAM:  Flowsheet Row Most Recent  Value  Orbital Region Moderate depletion  Upper Arm Region Mild depletion  Thoracic and Lumbar Region No depletion  Buccal Region Unable to assess  Temple Region No depletion  Clavicle Bone Region Mild depletion  Clavicle and Acromion Bone Region Mild depletion  Scapular Bone Region Unable to assess  Dorsal Hand Unable to assess  Patellar Region Moderate depletion  Anterior Thigh Region Moderate depletion  Posterior Calf Region Severe depletion  Edema (RD Assessment) Mild  Hair Reviewed  Eyes Unable to assess  Mouth Unable to assess  Skin Reviewed  Nails Reviewed    Diet Order:   Diet Order             Diet NPO time specified  Diet effective now                   EDUCATION NEEDS:  Not appropriate for education at this time  Skin:  Skin Assessment: Reviewed RN Assessment  Last BM:  7/5 - type 4  Height:  Ht Readings from Last 1 Encounters:  10/12/23 5' 6 (1.676 m)   Weight:  Wt Readings from Last 1 Encounters:  10/12/23 72.2 kg   Ideal Body Weight:  64.55 kg  BMI:  Body mass index is 25.69 kg/m.  Estimated Nutritional Needs:  Kcal:  1550-1750 kcals  Protein:  95-110 grams Fluid:  >/= 1.6L    Trude Ned RD, LDN Contact via Secure Chat.

## 2023-10-12 NOTE — Progress Notes (Signed)
   10/12/23 0940  TOC Brief Assessment  Insurance and Status Reviewed  Patient has primary care physician Yes  Home environment has been reviewed single family home  Prior level of function: independent  Prior/Current Home Services No current home services  Social Drivers of Health Review SDOH reviewed no interventions necessary  Readmission risk has been reviewed Yes  Transition of care needs transition of care needs identified, TOC will continue to follow   TOC will continue to follow for discharge needs.   Heather Saltness, MSW, LCSW 10/12/2023 9:41 AM

## 2023-10-13 ENCOUNTER — Inpatient Hospital Stay (HOSPITAL_COMMUNITY)

## 2023-10-13 DIAGNOSIS — R011 Cardiac murmur, unspecified: Secondary | ICD-10-CM | POA: Diagnosis not present

## 2023-10-13 DIAGNOSIS — I6521 Occlusion and stenosis of right carotid artery: Secondary | ICD-10-CM

## 2023-10-13 DIAGNOSIS — J9622 Acute and chronic respiratory failure with hypercapnia: Secondary | ICD-10-CM | POA: Diagnosis not present

## 2023-10-13 DIAGNOSIS — J9690 Respiratory failure, unspecified, unspecified whether with hypoxia or hypercapnia: Secondary | ICD-10-CM | POA: Diagnosis not present

## 2023-10-13 DIAGNOSIS — C3490 Malignant neoplasm of unspecified part of unspecified bronchus or lung: Secondary | ICD-10-CM | POA: Diagnosis not present

## 2023-10-13 DIAGNOSIS — I639 Cerebral infarction, unspecified: Secondary | ICD-10-CM | POA: Diagnosis not present

## 2023-10-13 DIAGNOSIS — Z8679 Personal history of other diseases of the circulatory system: Secondary | ICD-10-CM

## 2023-10-13 DIAGNOSIS — J441 Chronic obstructive pulmonary disease with (acute) exacerbation: Secondary | ICD-10-CM | POA: Diagnosis not present

## 2023-10-13 DIAGNOSIS — Z85118 Personal history of other malignant neoplasm of bronchus and lung: Secondary | ICD-10-CM

## 2023-10-13 DIAGNOSIS — E1165 Type 2 diabetes mellitus with hyperglycemia: Secondary | ICD-10-CM

## 2023-10-13 DIAGNOSIS — A419 Sepsis, unspecified organism: Secondary | ICD-10-CM | POA: Diagnosis not present

## 2023-10-13 DIAGNOSIS — J9621 Acute and chronic respiratory failure with hypoxia: Secondary | ICD-10-CM | POA: Diagnosis not present

## 2023-10-13 LAB — GLUCOSE, CAPILLARY
Glucose-Capillary: 259 mg/dL — ABNORMAL HIGH (ref 70–99)
Glucose-Capillary: 247 mg/dL — ABNORMAL HIGH (ref 70–99)
Glucose-Capillary: 229 mg/dL — ABNORMAL HIGH (ref 70–99)
Glucose-Capillary: 277 mg/dL — ABNORMAL HIGH (ref 70–99)
Glucose-Capillary: 195 mg/dL — ABNORMAL HIGH (ref 70–99)
Glucose-Capillary: 233 mg/dL — ABNORMAL HIGH (ref 70–99)

## 2023-10-13 LAB — ECHOCARDIOGRAM COMPLETE
AR max vel: 0.78 cm2
AV Area VTI: 0.77 cm2
AV Area mean vel: 0.65 cm2
AV Mean grad: 15 mmHg
AV Peak grad: 26.8 mmHg
Ao pk vel: 2.59 m/s
Area-P 1/2: 4.31 cm2
Height: 66 in
S' Lateral: 3.1 cm
Weight: 2645.52 [oz_av]

## 2023-10-13 LAB — BASIC METABOLIC PANEL WITH GFR
Anion gap: 7 (ref 5–15)
BUN: 34 mg/dL — ABNORMAL HIGH (ref 8–23)
CO2: 24 mmol/L (ref 22–32)
Calcium: 8.7 mg/dL — ABNORMAL LOW (ref 8.9–10.3)
Chloride: 104 mmol/L (ref 98–111)
Creatinine, Ser: 0.79 mg/dL (ref 0.61–1.24)
GFR, Estimated: >60 mL/min (ref >60)
Glucose, Bld: 263 mg/dL — ABNORMAL HIGH (ref 70–99)
Potassium: 4.1 mmol/L (ref 3.5–5.1)
Sodium: 135 mmol/L (ref 135–145)

## 2023-10-13 LAB — CBC
HCT: 29.4 % — ABNORMAL LOW (ref 39.0–52.0)
Hemoglobin: 9 g/dL — ABNORMAL LOW (ref 13.0–17.0)
MCH: 33 pg (ref 26.0–34.0)
MCHC: 30.6 g/dL (ref 30.0–36.0)
MCV: 107.7 fL — ABNORMAL HIGH (ref 80.0–100.0)
Platelets: 478 K/uL — ABNORMAL HIGH (ref 150–400)
RBC: 2.73 MIL/uL — ABNORMAL LOW (ref 4.22–5.81)
RDW: 17.5 % — ABNORMAL HIGH (ref 11.5–15.5)
WBC: 13.2 K/uL — ABNORMAL HIGH (ref 4.0–10.5)
nRBC: 0 % (ref 0.0–0.2)

## 2023-10-13 LAB — PHOSPHORUS
Phosphorus: 2.7 mg/dL (ref 2.5–4.6)
Phosphorus: 2.1 mg/dL — ABNORMAL LOW (ref 2.5–4.6)

## 2023-10-13 LAB — FOLATE: Folate: 8.7 ng/mL (ref >5.9)

## 2023-10-13 LAB — VITAMIN B12: Vitamin B-12: 421 pg/mL (ref 180–914)

## 2023-10-13 LAB — MAGNESIUM
Magnesium: 2.3 mg/dL (ref 1.7–2.4)
Magnesium: 2.3 mg/dL (ref 1.7–2.4)

## 2023-10-13 MED ORDER — INSULIN GLARGINE-YFGN 100 UNIT/ML ~~LOC~~ SOLN
5.0000 [IU] | Freq: Every day | SUBCUTANEOUS | Status: DC
  Administered 2023-10-13: 5 [IU] via SUBCUTANEOUS
  Filled 2023-10-13: qty 0.05

## 2023-10-13 MED ORDER — SODIUM CHLORIDE (PF) 0.9 % IJ SOLN
INTRAMUSCULAR | Status: AC
  Filled 2023-10-13: qty 50

## 2023-10-13 MED ORDER — INSULIN ASPART 100 UNIT/ML IJ SOLN
0.0000 [IU] | INTRAMUSCULAR | Status: DC
  Administered 2023-10-13 (×2): 7 [IU] via SUBCUTANEOUS
  Administered 2023-10-13: 11 [IU] via SUBCUTANEOUS
  Administered 2023-10-13: 4 [IU] via SUBCUTANEOUS
  Administered 2023-10-14 (×2): 7 [IU] via SUBCUTANEOUS
  Administered 2023-10-14: 4 [IU] via SUBCUTANEOUS
  Administered 2023-10-14 (×2): 7 [IU] via SUBCUTANEOUS
  Administered 2023-10-14: 4 [IU] via SUBCUTANEOUS
  Administered 2023-10-14: 7 [IU] via SUBCUTANEOUS
  Administered 2023-10-15: 3 [IU] via SUBCUTANEOUS
  Administered 2023-10-15 (×3): 4 [IU] via SUBCUTANEOUS
  Administered 2023-10-16: 3 [IU] via SUBCUTANEOUS
  Administered 2023-10-16: 4 [IU] via SUBCUTANEOUS
  Administered 2023-10-16: 3 [IU] via SUBCUTANEOUS
  Administered 2023-10-16: 4 [IU] via SUBCUTANEOUS
  Administered 2023-10-17: 3 [IU] via SUBCUTANEOUS

## 2023-10-13 MED ORDER — SODIUM PHOSPHATES 45 MMOLE/15ML IV SOLN
30.0000 mmol | Freq: Once | INTRAVENOUS | Status: AC
  Administered 2023-10-13: 30 mmol via INTRAVENOUS
  Filled 2023-10-13: qty 10

## 2023-10-13 MED ORDER — CHLORHEXIDINE GLUCONATE CLOTH 2 % EX PADS
6.0000 | MEDICATED_PAD | Freq: Every day | CUTANEOUS | Status: DC
  Administered 2023-10-14 – 2023-10-17 (×4): 6 via TOPICAL

## 2023-10-13 MED ORDER — HYDRALAZINE HCL 20 MG/ML IJ SOLN
5.0000 mg | INTRAMUSCULAR | Status: AC | PRN
  Administered 2023-10-13 – 2023-10-15 (×2): 5 mg via INTRAVENOUS
  Filled 2023-10-13 (×2): qty 1

## 2023-10-13 MED ORDER — HEPARIN (PORCINE) 25000 UT/250ML-% IV SOLN
900.0000 [IU]/h | INTRAVENOUS | Status: DC
  Administered 2023-10-13: 900 [IU]/h via INTRAVENOUS
  Filled 2023-10-13: qty 250

## 2023-10-13 MED ORDER — STROKE: EARLY STAGES OF RECOVERY BOOK
Freq: Once | Status: DC
Start: 2023-10-14 — End: 2023-10-18
  Filled 2023-10-13: qty 1

## 2023-10-13 MED ORDER — METHYLPREDNISOLONE SODIUM SUCC 40 MG IJ SOLR
40.0000 mg | Freq: Every day | INTRAMUSCULAR | Status: DC
  Administered 2023-10-14: 40 mg via INTRAVENOUS
  Filled 2023-10-13: qty 1

## 2023-10-13 MED ORDER — IOHEXOL 350 MG/ML SOLN
75.0000 mL | Freq: Once | INTRAVENOUS | Status: AC | PRN
  Administered 2023-10-13: 75 mL via INTRAVENOUS

## 2023-10-13 MED ORDER — MIDAZOLAM HCL 2 MG/2ML IJ SOLN
2.0000 mg | Freq: Once | INTRAMUSCULAR | Status: AC
  Administered 2023-10-13: 2 mg via INTRAVENOUS
  Filled 2023-10-13: qty 2

## 2023-10-13 MED ORDER — SODIUM CHLORIDE 0.9 % IV SOLN
2.0000 g | Freq: Every day | INTRAVENOUS | Status: AC
  Administered 2023-10-13 – 2023-10-16 (×4): 2 g via INTRAVENOUS
  Filled 2023-10-13 (×4): qty 20

## 2023-10-13 MED ORDER — HEPARIN (PORCINE) 25000 UT/250ML-% IV SOLN
1100.0000 [IU]/h | INTRAVENOUS | Status: DC
  Administered 2023-10-13: 900 [IU]/h via INTRAVENOUS

## 2023-10-13 MED ORDER — QUETIAPINE FUMARATE 50 MG PO TABS
50.0000 mg | ORAL_TABLET | Freq: Every day | ORAL | Status: DC
  Administered 2023-10-13: 50 mg
  Filled 2023-10-13: qty 1

## 2023-10-13 MED ORDER — STERILE WATER FOR INJECTION IJ SOLN
INTRAMUSCULAR | Status: AC
  Administered 2023-10-13: 10 mL
  Filled 2023-10-13: qty 10

## 2023-10-13 NOTE — Progress Notes (Signed)
 Pt transported to MRI on vent and back with know complications.

## 2023-10-13 NOTE — Plan of Care (Signed)
  Problem: Metabolic: Goal: Ability to maintain appropriate glucose levels will improve Outcome: Progressing   Problem: Nutritional: Goal: Maintenance of adequate nutrition will improve Outcome: Progressing   Problem: Health Behavior/Discharge Planning: Goal: Ability to manage health-related needs will improve Outcome: Not Progressing   Problem: Skin Integrity: Goal: Risk for impaired skin integrity will decrease Outcome: Not Progressing

## 2023-10-13 NOTE — Progress Notes (Signed)
  Echocardiogram 2D Echocardiogram has been performed.  Tinnie FORBES Gosling RDCS 10/13/2023, 11:00 AM

## 2023-10-13 NOTE — Consult Note (Signed)
 NEUROLOGY CONSULT NOTE   Date of service: October 13, 2023 Patient Name: John [name] MRN:  [id number] DOB:  1945-07-09 Chief Complaint: Shortness of breath Requesting Provider: [name], MD  History of Present Illness  John [name] is a 79 y.o. male with hx of lung cancer on chemotherapy, atrial flutter s/p ablation 02/13/2022, anxiety, COPD, CHF, diabetes type 2, GERD, hypertension, hyperlipidemia, hypothyroidism, neuropathy, PVD s/p right femoral endarterectomy (08/20/2020) and right common iliac artery stent (08/10/2020), sleep apnea, ongoing tobacco abuse, history of GI bleed, moderate to severe aortic valve stenosis, who initially presented to [facility] with shortness of breath.  He was intubated in the ED and transferred to [facility] ICU. On 7/1 he was seen by his oncologist. He had 2 falls and some disorientation at that time and his treatment plan was changed. He received Opdivo  and his taxol  and carboplatin  were discontinued, per reports he tolerated treatment well and was sent home. His wife had difficulty getting him into the house and EMS brought him to [facility]. He was discharged 7/4 after treatment for sepsis and pneumonia. EMS called back on 7/5 due to hypoxia. Per EMS he was hypoxic and hypotensive en route to the hospital and he did have a fall overnight prior to admission. He was intubated in the ED and transferred to [facility] for continued care. On exam he has been altered (when sedation is weaned he is agitated) so an MRI was ordered that showed a number of punctate infarcts concerning for an embolic process and Neurology was consulted.   Last chemotherapy he received was nivolumab  360 mg on 7/1 Patient is on Treatment Plan : LUNG NSCLC SQUAMOUS Nivolumab  + Ipilimumab  + Carboplatin  + Paclitaxel  q42d X 1 cycle / Nivolumab  + Ipilimumab  q42d, however did not receive chemotherapy on 7/1 due to weakness  Per oncology progress note from 7/1 wife noted that he is drowsy often  with energy level of 25% and he had had 2 recent falls, with wife noting that he did not make much sense for a few hours after the second fall with some disorientation but denied loss of consciousness and noted the room that he was in was dark  On review of recent pacemaker device checks he has not been having atrial fibrillation although he has been having some episodes of atrial tachycardia.  Per cardiology notes he did not tolerate bisoprolol  due to confusion and delirium  Of note he was admitted to [facility] from July 1 through July 4 as he had trouble getting inside his home after his immunotherapy due to weakness, treated for sepsis secondary to healthcare associated pneumonia and required BiPAP intermittently throughout the day even on 7/3 up to 100% FiO2 at times but was back down to 2 L of nasal cannula saturating well in the high 90s and wanted to be discharged.  Per ED triage notes overnight on 7/4-5 EMS had been called for a fall and he was found to be hypoxic to 80% on home O2 but refused transport.  However later in the morning EMS was called again due to shortness of breath and found him to be hypoxic to 76% improving to 92% on 4 L as well as hypotensive with systolic blood pressures in the 50s and route.  Reportedly initially alert and oriented x 4 with some hip pain and right arm pain secondary to fall.  He was initially placed on BiPAP and while vital signs and oxygenation improved he became less responsive and  he was therefore intubated   ROS   Unable to ascertain due to intubation, altered mental status   Past History   Past Medical History:  Diagnosis Date   Adenocarcinoma of lung, right (HCC) 04/18/2016   Anxiety    Arthritis    Asthma    Atrial flutter (HCC)    BPH (benign prostatic hyperplasia)    with urinary retention 02/06/20   CHF (congestive heart failure) (HCC)    COPD (chronic obstructive pulmonary disease) (HCC)    Depression    Diabetes mellitus, type II (HCC)     Dyspnea    GERD (gastroesophageal reflux disease)    History of blood transfusion    History of kidney stones    History of radiation therapy    right lung 09/10/2020-09/17/2020   Dr Shannon   History of radiation therapy    Left and right Lung- 06/19/22-06/26/22   Hyperlipidemia    Hypertension    Hypothyroidism    IDA (iron  deficiency anemia)    Macular degeneration    Neuropathy    hands and feet   Non-small cell lung cancer, right (HCC) 04/18/2016   Oxygen  dependent    uses 2L oxygen  as needed   PAD (peripheral artery disease) (HCC) 03/2023   managed by Dr Lonni Gaskins   Peripheral vascular disease [facility])    Prostatitis    Pulmonary nodule, left 07/16/2016   Sleep apnea 2019   uses CPAP nightly    Past Surgical History:  Procedure Laterality Date   A-FLUTTER ABLATION N/A 01/13/2022   Procedure: A-FLUTTER ABLATION;  Surgeon: Cindie Ole DASEN, MD;  Location: [facility] INVASIVE CV LAB;  Service: Cardiovascular;  Laterality: N/A;   ABDOMINAL AORTOGRAM W/LOWER EXTREMITY Left 02/06/2020   Procedure: ABDOMINAL AORTOGRAM W/LOWER EXTREMITY;  Surgeon: Court Dorn PARAS, MD;  Location: [facility] INVASIVE CV LAB;  Service: Cardiovascular;  Laterality: Left;   ABDOMINAL AORTOGRAM W/LOWER EXTREMITY N/A 05/30/2021   Procedure: ABDOMINAL AORTOGRAM W/LOWER EXTREMITY;  Surgeon: Gaskins Lonni PARAS, MD;  Location: [facility] INVASIVE CV LAB;  Service: Cardiovascular;  Laterality: N/A;   ABDOMINAL AORTOGRAM W/LOWER EXTREMITY N/A 02/05/2023   Procedure: ABDOMINAL AORTOGRAM W/LOWER EXTREMITY;  Surgeon: Gaskins Lonni PARAS, MD;  Location: [facility] INVASIVE CV LAB;  Service: Cardiovascular;  Laterality: N/A;   BIOPSY  09/16/2021   Procedure: BIOPSY;  Surgeon: Avram Lupita BRAVO, MD;  Location: [facility] ENDOSCOPY;  Service: Gastroenterology;;   BRONCHIAL BIOPSY  08/03/2023   Procedure: BRONCHOSCOPY, WITH BIOPSY;  Surgeon: Shelah Lamar RAMAN, MD;  Location: [facility] ENDOSCOPY;  Service: Pulmonary;;   BRONCHIAL BRUSHINGS  08/03/2023    Procedure: BRONCHOSCOPY, WITH BRUSH BIOPSY;  Surgeon: Shelah Lamar RAMAN, MD;  Location: [facility] ENDOSCOPY;  Service: Pulmonary;;   BRONCHIAL NEEDLE ASPIRATION BIOPSY  08/03/2023   Procedure: BRONCHOSCOPY, WITH NEEDLE ASPIRATION BIOPSY;  Surgeon: Shelah Lamar RAMAN, MD;  Location: [facility] ENDOSCOPY;  Service: Pulmonary;;   CARDIOVERSION N/A 10/05/2020   Procedure: CARDIOVERSION;  Surgeon: Barbaraann Darryle Ned, MD;  Location: [facility] ENDOSCOPY;  Service: Cardiovascular;  Laterality: N/A;   CATARACT EXTRACTION, BILATERAL Bilateral    COLONOSCOPY WITH PROPOFOL  N/A 06/20/2021   Procedure: COLONOSCOPY WITH PROPOFOL ;  Surgeon: Abran Norleen SAILOR, MD;  Location: [facility] ENDOSCOPY;  Service: Endoscopy;  Laterality: N/A;   COLONOSCOPY WITH PROPOFOL  N/A 09/16/2021   Procedure: COLONOSCOPY WITH PROPOFOL ;  Surgeon: Avram Lupita BRAVO, MD;  Location: [facility] ENDOSCOPY;  Service: Gastroenterology;  Laterality: N/A;   ENDARTERECTOMY FEMORAL Right 08/20/2020   Procedure: ENDARTERECTOMY  RIGHT FEMORAL ARTERY;  Surgeon: Gaskins Lonni PARAS,  MD;  Location: [facility] OR;  Service: Vascular;  Laterality: Right;   ENTEROSCOPY N/A 10/11/2021   Procedure: ENTEROSCOPY;  Surgeon: Eartha Angelia Sieving, MD;  Location: [facility] ENDO SUITE;  Service: Gastroenterology;  Laterality: N/A;   ENTEROSCOPY N/A 02/18/2023   Procedure: ENTEROSCOPY;  Surgeon: Shaaron Lamar HERO, MD;  Location: [facility] ENDO SUITE;  Service: Endoscopy;  Laterality: N/A;  EGD with possible small bowel push enteroscopy   ESOPHAGOGASTRODUODENOSCOPY N/A 09/16/2021   Procedure: ESOPHAGOGASTRODUODENOSCOPY (EGD);  Surgeon: Avram Lupita BRAVO, MD;  Location: [facility] ENDOSCOPY;  Service: Gastroenterology;  Laterality: N/A;   ESOPHAGOGASTRODUODENOSCOPY (EGD) WITH PROPOFOL  N/A 09/06/2020   Procedure: ESOPHAGOGASTRODUODENOSCOPY (EGD) WITH PROPOFOL ;  Surgeon: Abran Norleen SAILOR, MD;  Location: [facility] ENDOSCOPY;  Service: Endoscopy;  Laterality: N/A;   ESOPHAGOGASTRODUODENOSCOPY (EGD) WITH PROPOFOL  N/A 10/11/2021   Procedure:  ESOPHAGOGASTRODUODENOSCOPY (EGD) WITH PROPOFOL ;  Surgeon: Eartha Angelia Sieving, MD;  Location: [facility] ENDO SUITE;  Service: Gastroenterology;  Laterality: N/A;   ESOPHAGOGASTRODUODENOSCOPY (EGD) WITH PROPOFOL  N/A 02/18/2023   Procedure: ESOPHAGOGASTRODUODENOSCOPY (EGD) WITH PROPOFOL ;  Surgeon: Shaaron Lamar HERO, MD;  Location: [facility] ENDO SUITE;  Service: Endoscopy;  Laterality: N/A;   GIVENS CAPSULE STUDY N/A 02/19/2023   Procedure: GIVENS CAPSULE STUDY;  Surgeon: Cinderella Deatrice FALCON, MD;  Location: [facility] ENDO SUITE;  Service: Endoscopy;  Laterality: N/A;   HEMOSTASIS CLIP PLACEMENT  09/16/2021   Procedure: HEMOSTASIS CLIP PLACEMENT;  Surgeon: Avram Lupita BRAVO, MD;  Location: [facility] ENDOSCOPY;  Service: Gastroenterology;;   HOT HEMOSTASIS N/A 09/16/2021   Procedure: HOT HEMOSTASIS (ARGON PLASMA COAGULATION/BICAP);  Surgeon: Avram Lupita BRAVO, MD;  Location: [facility] ENDOSCOPY;  Service: Gastroenterology;  Laterality: N/A;   HOT HEMOSTASIS  10/11/2021   Procedure: HOT HEMOSTASIS (ARGON PLASMA COAGULATION/BICAP);  Surgeon: Eartha Angelia, Sieving, MD;  Location: [facility] ENDO [facility];  Service: Gastroenterology;;   HOT HEMOSTASIS  02/18/2023   Procedure: HOT HEMOSTASIS (ARGON PLASMA COAGULATION/BICAP);  Surgeon: Shaaron Lamar HERO, MD;  Location: [facility] ENDO [facility];  Service: Endoscopy;;   INSERTION OF ILIAC STENT Right 08/20/2020   Procedure: RETROGRADE INSERTION OF RIGHT ILIAC STENT;  Surgeon: Gretta Lonni PARAS, MD;  Location: [facility] OR;  Service: Vascular;  Laterality: Right;   INTRAOPERATIVE ARTERIOGRAM Right 08/20/2020   Procedure: INTRA OPERATIVE ARTERIOGRAM ILIAC;  Surgeon: Gretta Lonni PARAS, MD;  Location: [facility] OR;  Service: Vascular;  Laterality: Right;   PATCH ANGIOPLASTY Right 08/20/2020   Procedure: PATCH ANGIOPLASTY RIGHT FEMORAL ARTERY;  Surgeon: Gretta Lonni PARAS, MD;  Location: [facility] OR;  Service: Vascular;  Laterality: Right;   PERIPHERAL VASCULAR BALLOON ANGIOPLASTY Right 05/30/2021   Procedure: PERIPHERAL VASCULAR  BALLOON ANGIOPLASTY;  Surgeon: Gretta Lonni PARAS, MD;  Location: [facility] INVASIVE CV LAB;  Service: Cardiovascular;  Laterality: Right;   PERIPHERAL VASCULAR INTERVENTION Right 02/05/2023   Procedure: PERIPHERAL VASCULAR INTERVENTION;  Surgeon: Gretta Lonni PARAS, MD;  Location: [facility] INVASIVE CV LAB;  Service: Cardiovascular;  Laterality: Right;  Rt SFA and Rt Ext Iliac   PORTACATH PLACEMENT Left 06/13/2016   Procedure: INSERTION PORT-A-CATH;  Surgeon: Oneil Budge, MD;  Location: [facility] ORS;  Service: General;  Laterality: Left;   TRANSURETHRAL RESECTION OF PROSTATE N/A 05/31/2020   Procedure: TRANSURETHRAL RESECTION OF THE PROSTATE (TURP);  Surgeon: Sherrilee Belvie CROME, MD;  Location: [facility] ORS;  Service: Urology;  Laterality: N/A;   VIDEO BRONCHOSCOPY WITH ENDOBRONCHIAL NAVIGATION N/A 05/28/2016   Procedure: VIDEO BRONCHOSCOPY WITH ENDOBRONCHIAL NAVIGATION;  Surgeon: Elspeth JAYSON Millers, MD;  Location: [facility] OR;  Service: Thoracic;  Laterality: N/A;   VIDEO BRONCHOSCOPY WITH ENDOBRONCHIAL NAVIGATION Bilateral 08/03/2023  Procedure: VIDEO BRONCHOSCOPY WITH ENDOBRONCHIAL NAVIGATION;  Surgeon: Shelah Lamar RAMAN, MD;  Location: [facility] ENDOSCOPY;  Service: Pulmonary;  Laterality: Bilateral;  WITH FLUORO   VIDEO BRONCHOSCOPY WITH ENDOBRONCHIAL ULTRASOUND N/A 05/28/2016   Procedure: VIDEO BRONCHOSCOPY WITH ENDOBRONCHIAL ULTRASOUND;  Surgeon: Elspeth JAYSON Millers, MD;  Location: [facility] OR;  Service: Thoracic;  Laterality: N/A;    Family History: Family History  Problem Relation Age of Onset   Hypertension Mother    Diabetes Father    Heart disease Father    Stroke Father    Hypertension Sister    Sleep apnea Neg Hx     Social History  reports that he has been smoking cigarettes. He started smoking about 62 years ago. He has a 31.3 pack-year smoking history. He has never used smokeless tobacco. He reports that he does not drink alcohol and does not use drugs.  Allergies  Allergen Reactions   Benadryl   [Diphenhydramine ] Other (See Comments)    Extreme leg pain and extreme somnolent      Bisoprolol  Other (See Comments)    Confusion, delirium    Sulfa  Antibiotics Swelling and Other (See Comments)    Mouth swelling   Atorvastatin Other (See Comments)    Muscle aches - tolerating Pravastatin  40 mg MWF   Jardiance  [Empagliflozin ] Other (See Comments)    FEELS SLUGGISH, TIRED   Lopressor  [Metoprolol ] Other (See Comments)    Fatigue   Rosuvastatin Other (See Comments)    Muscle aches - tolerating Pravastatin  40 mg MWF   Doxycycline  Nausea Only   Temazepam  Other (See Comments)    Made insomnia worse    Tramadol  Itching    Medications   Current Facility-Administered Medications:    arformoterol  (BROVANA ) nebulizer solution 15 mcg, 15 mcg, Nebulization, BID, Autry, Lauren E, PA-C, 15 mcg at 10/13/23 9266   azithromycin  (ZITHROMAX ) 500 mg in sodium chloride  0.9 % 250 mL IVPB, 500 mg, Intravenous, Q24H, Rigney, Christopher D, PA-C, Stopped at 10/13/23 1357   budesonide  (PULMICORT ) nebulizer solution 0.25 mg, 0.25 mg, Nebulization, BID, Autry, Lauren E, PA-C, 0.25 mg at 10/13/23 9266   cefTRIAXone  (ROCEPHIN ) 2 g in sodium chloride  0.9 % 100 mL IVPB, 2 g, Intravenous, Daily, Adolph, Lauren E, PA-C, Stopped at 10/13/23 1253   [START ON 10/14/2023] Chlorhexidine  Gluconate Cloth 2 % PADS 6 each, 6 each, Topical, Q2200, Desai, Nikita S, MD   clopidogrel  (PLAVIX ) tablet 75 mg, 75 mg, Per Tube, Daily, Adolph, Lauren E, PA-C, 75 mg at 10/13/23 1010   dexmedetomidine  (PRECEDEX ) 400 MCG/100ML (4 mcg/mL) infusion, 0-1.2 mcg/kg/hr, Intravenous, Titrated, Autry, Lauren E, PA-C, Last Rate: 10.83 mL/hr at 10/13/23 1024, 0.6 mcg/kg/hr at 10/13/23 1024   docusate (COLACE) 50 MG/5ML liquid 100 mg, 100 mg, Per Tube, BID, Mannam, Praveen, MD, 100 mg at 10/13/23 1010   docusate sodium  (COLACE) capsule 100 mg, 100 mg, Oral, BID PRN, Mannam, Praveen, MD   famotidine  (PEPCID ) tablet 20 mg, 20 mg, Per Tube, BID, Mannam,  Praveen, MD, 20 mg at 10/13/23 1010   feeding supplement (VITAL AF 1.2 CAL) liquid 1,000 mL, 1,000 mL, Per Tube, Continuous, [name], MD, Last Rate: 60 mL/hr at 10/13/23 0536, Infusion Verify at 10/13/23 0536   fentaNYL  (SUBLIMAZE ) bolus via infusion 25-100 mcg, 25-100 mcg, Intravenous, Q15 min PRN, Mannam, Praveen, MD, 100 mcg at 10/13/23 0858   fentaNYL  (SUBLIMAZE ) injection 25-50 mcg, 25-50 mcg, Intravenous, Once, Mannam, Praveen, MD   fentaNYL  in NS (59mcg/ml) infusion-PREMIX, 0-400 mcg/hr, Intravenous, Continuous, Mannam, Praveen, MD,  Last Rate: 7.5 mL/hr at 10/13/23 1301, 75 mcg/hr at 10/13/23 1301   gabapentin  (NEURONTIN ) 250 MG/5ML solution 300 mg, 300 mg, Per Tube, TID, Desai, Nikita S, MD, 300 mg at 10/13/23 1011   hydrALAZINE  (APRESOLINE ) injection 5 mg, 5 mg, Intravenous, Q1H PRN, Rober Gianotti T, MD, 5 mg at 10/13/23 0037   insulin  aspart (novoLOG ) injection 0-20 Units, 0-20 Units, Subcutaneous, Q4H, Adolph, Lauren E, PA-C, 7 Units at 10/13/23 1223   insulin  glargine-yfgn (SEMGLEE ) injection 5 Units, 5 Units, Subcutaneous, QHS, Autry, Lauren E, PA-C   levothyroxine  (SYNTHROID ) tablet 50 mcg, 50 mcg, Per Tube, Q0600, Autry, Lauren E, PA-C, 50 mcg at 10/13/23 0541   [START ON 10/14/2023] methylPREDNISolone  sodium succinate (SOLU-MEDROL ) 40 mg/mL injection 40 mg, 40 mg, Intravenous, Daily, Desai, Nikita S, MD   mupirocin  ointment (BACTROBAN ) 2 % 1 Application, 1 Application, Nasal, BID, Mannam, Praveen, MD, 1 Application at 10/13/23 1025   Oral care mouth rinse, 15 mL, Mouth Rinse, Q2H, Mannam, Praveen, MD, 15 mL at 10/13/23 1223   Oral care mouth rinse, 15 mL, Mouth Rinse, PRN, Mannam, Praveen, MD   pantoprazole  (PROTONIX ) injection 40 mg, 40 mg, Intravenous, Daily, Shade, Christine E, RPH, 40 mg at 10/13/23 1011   polyethylene glycol (MIRALAX  / GLYCOLAX ) packet 17 g, 17 g, Oral, Daily PRN, Mannam, Praveen, MD   polyethylene glycol (MIRALAX  / GLYCOLAX ) packet 17 g, 17 g,  Per Tube, Daily, Mannam, Praveen, MD, 17 g at 10/13/23 1010   pravastatin  (PRAVACHOL ) tablet 40 mg, 40 mg, Per Tube, Once per day on Monday Wednesday Friday, Autry, Lauren E, PA-C, 40 mg at 10/12/23 9042   revefenacin  (YUPELRI ) nebulizer solution 175 mcg, 175 mcg, Nebulization, Daily, Adolph Tinnie BRAVO, PA-C, 175 mcg at 10/13/23 0733   sodium chloride  flush (NS) 0.9 % injection 10-40 mL, 10-40 mL, Intracatheter, PRN, Mannam, Praveen, MD   vancomycin  (VANCOREADY) IVPB 1500 mg/300 mL, 1,500 mg, Intravenous, Q24H, Shade, Wanda BRAVO, RPH, Stopped at 10/12/23 1818  Vitals   Vitals:   10/13/23 0800 10/13/23 1000 10/13/23 1100 10/13/23 1200  BP: (!) 138/49 (!) 138/55 (!) 151/58   Pulse: 78 83 77 81  Resp: 11 16 16 16   Temp: 97.7 F (36.5 C) 97.7 F (36.5 C)    TempSrc: Axillary     SpO2: 96% 96% 100% 100%  Weight:      Height:        Body mass index is 26.69 kg/m.   Physical Exam   Constitutional: Appears well-developed and well-nourished.  Psych: Not interactive Eyes: No scleral injection.  HENT: ETT in place Head: Normocephalic.  Cardiovascular: Normal rate and regular rhythm.  Respiratory: Mechanically ventilated GI: Soft.  No distension. There is no tenderness.  Skin: WDI.   Neurologic Examination   Neuro: Mental Status: Intubated, unresponsive.  Sedation paused for exam.  Opens eyes with noxious stimuli, does not follow commands Cranial Nerves: II: PERRL, does not blink to threat III,IV, VI: Eyes are midline, does not track VII: Facial movement grossly symmetric resting and grimacing VIII: No response to verbal stimuli X: Cough and gag intact XI: Head is midline XII: ETT in place Motor/Sensory: Tone is normal. Bulk is normal.  Localizes to noxious stimuli with left upper extremity.   Withdraws in bilateral lower extremities   On later attending examination he had unfortunately just received multiple doses of sedating medications to facilitate care as he had had a  large bowel movement and needed to be cleaned. Pupils were equal round reactive to  light, disconjugate gaze, no response to noxious stimulation consistent with significant sedation  Labs/Imaging/Neurodiagnostic studies   CBC:  Recent Labs  Lab 10/28/2023 1011 October 28, 2023 1955 10/12/23 0500 10/13/23 0545  WBC 17.7*   < > 13.2* 13.2*  NEUTROABS 14.6*  --   --   --   HGB 9.6*   < > 8.1* 9.0*  HCT 31.7*   < > 26.9* 29.4*  MCV 107.5*   < > 108.9* 107.7*  PLT 546*   < > 444* 478*   < > = values in this interval not displayed.   Basic Metabolic Panel:  Lab Results  Component Value Date   NA 135 10/13/2023   K 4.1 10/13/2023   CO2 24 10/13/2023   GLUCOSE 263 (H) 10/13/2023   BUN 34 (H) 10/13/2023   CREATININE 0.79 10/13/2023   CALCIUM 8.7 (L) 10/13/2023   GFRNONAA >60 10/13/2023   GFRAA 102 05/24/2020   Lipid Panel:  Lab Results  Component Value Date   LDLCALC 80 08/21/2020   HgbA1c:  Lab Results  Component Value Date   HGBA1C 7.9 (A) 08/24/2023   INR  Lab Results  Component Value Date   INR 1.1 10-28-23   APTT  Lab Results  Component Value Date   APTT 41 (H) 05/31/2023   MRI Brain(Personally reviewed): There are several punctate acute infarcts involving both hemispheres, 3 in the right frontal lobe, 1 in the right occipital lobe, 1 in the posteromedial left frontal lobe and 1 in the left parietal lobe. Kathrynn is also a thin subdural collection concerning for possible subdural hematoma along the right parietal lobe, reported on CTA head and neck and present on my personal review]  CTA head and neck personally reviewed: Non-contrast head CT: 1. Known small acute infarcts within the bilateral cerebral hemispheres are occult by CT and were better appreciated on the brain MRI performed earlier today. 2. A thin subdural collection (possible subdural hematoma) along the right parietal lobe is also occult by CT and was better appreciated on the brain MRI performed  earlier today (measuring 3 mm in thickness on the prior exam).  3. Background parenchymal atrophy and chronic small vessel ischemic disease.   CTA neck: 1. The common carotid and internal carotid arteries are patent within the neck. Atherosclerotic plaque bilaterally, most notably as follows. Severe (estimated greater than 80%) stenosis of the proximal right ICA. 30-40% stenosis of the proximal left ICA. 2. Vertebral arteries patent within the neck. Mild atherosclerotic narrowing at the left vertebral artery origin. 3. Aortic Atherosclerosis (ICD10-I70.0).   CTA head: 1. No proximal intracranial large vessel occlusion is identified. 2. Atherosclerotic plaque within the intracranial internal carotid arteries. Most notably, there is up to moderate stenosis of the paraclinoid right ICA.  Echocardiogram EF 55-60%, LA normal in size  1. Left ventricular ejection fraction, by estimation, is 55 to 60%. The  left ventricle has normal function. Left ventricular endocardial border  not optimally defined to evaluate regional wall motion. Left ventricular  diastolic function could not be  evaluated.   2. Right ventricular systolic function is normal. The right ventricular  size is mildly enlarged.   3. A small pericardial effusion is present. The pericardial effusion is  surrounding the apex.   4. The mitral valve is degenerative. No evidence of mitral valve  regurgitation. Moderate to severe mitral annular calcification.   5. This is a technically difficult study: DVI 0.25 and limited valve  motion consistent with severe aortic stenosis with  decreased stroke volume  index; mean gradient 15 mm Hg. Off axis images. . The aortic valve was not  well visualized. Aortic valve  regurgitation is trivial. Moderate to severe aortic valve stenosis.   6. The inferior vena cava is dilated in size with <50% respiratory  variability, suggesting right atrial pressure of 15 mmHg.   10/09/2023 ILR summary  report  Battery status OK. Normal device function. No new symptom, tachy, brady, or pause episodes. No new AF episodes. 28 AT events. Longest 44 hrs on 09/28/23. V-rates 110s-120s. All appear ST.  Presenting rhythm is SR 90s. Monthly summary reports and ROV/PRN   Neurodiagnostics rEEG:  This EEG is suggestive of moderate to severe diffuse encephalopathy.  No seizures or epileptiform discharges were seen throughout the recording.  [Was on propofol  and gabapentin  at the time]   ASSESSMENT   SRIJAN GIVAN is a 78 y.o. male with a past medical history of COPD, lung cancer, recent admission for PNA/sepsis presenting with respiratory failure. MRI done for encephalopathy that shows punctate infarcts. Neurology engaged for stroke work up. Given his history of aflutter and bilateral punctate infarcts agree that anticoagulation is reasonable if there are no other contraindications (strokes are very small and would not be a significant contraindication). CTA Head and neck pending for completion of stroke work up.  Ammonia, B12, TSH, and Folate all WNL.   Right ICA stenosis is not felt to be the culprit etiology of his current strokes given there are bilateral strokes.  Additionally it appears that this was suspected to be high-grade based on 08/25/2021 CT cervical spine, but on carotid ultrasound at the time there was no significant stenosis; CTA head and neck can overestimate stenosis secondary to calcific artifacts  Recent loop recorder interrogation yesterday demonstrates no intercurrent atrial fibrillation/atrial flutter, therefore highest index of suspicion that these represent stroke secondary to hypercoagulable state of malignancy.  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 given description of his movements by nursing and documented significant hypoxemia in notes - High risk for delirium due to his multiple medical conditions, hospitalization, medications including cefepime   RECOMMENDATIONS  - HgbA1c goal less than 7%, fasting lipid panel, goal LDL less than 70, please adjust medications as needed - CTA Head and Neck completed on our recommendations and reviewed as above, outpatient follow-up for 80% right ICA stenosis - MRI brain with contrast - Frequent neuro checks - Heparin  gtt okay from stroke perspective, likely okay from subdural hemorrhage perspective as well but would check with neurosurgery.  Defer to oncology for choice of long-term anticoagulation agent given malignancy history - Risk factor modification, ongoing tobacco cessation counseling - Telemetry monitoring - PT consult, OT consult, Speech consult when medically appropriate - Neurology will follow along ______________________________________________________________________    Signed, Jorene Last, NP [facility]  Attending Neurologist's note:  I personally saw this patient, gathering history, performing a full neurologic examination, reviewing relevant labs, personally reviewing relevant imaging including MRI brain, CTA head and neck, and formulated the assessment and plan, adding the note above for completeness and clarity to accurately reflect my thoughts  [employer]  MD-PhD [facility] [phone] Available 7 AM to 7 PM, outside these hours please contact Neurologist on call listed on AMION   CRITICAL CARE Performed by: Lola LITTIE Jernigan   Total critical care time: 45 minutes  Critical care time was exclusive of separately  billable procedures and treating other patients.  Critical care was necessary to treat or prevent imminent or life-threatening deterioration.  Critical care was time spent personally by me on the following activities: development of treatment plan with patient and/or surrogate as well as nursing, discussions with consultants, evaluation of patient's response to treatment, examination of patient, obtaining history from patient or surrogate, ordering and performing treatments and interventions, ordering and review of laboratory studies, ordering and review of radiographic studies, pulse oximetry and re-evaluation of patient's condition.

## 2023-10-13 NOTE — Progress Notes (Signed)
 eLink Physician-Brief Progress Note Patient Name: John [name] DOB: December 06, 1945 MRN: [id number]   Date of Service  10/13/2023  HPI/Events of Note  Patient is intubated and needs restraints order renewed.  eICU Interventions  Restraints renewed.        Wessley Emert U Tonja Jezewski 10/13/2023, 9:39 PM

## 2023-10-13 NOTE — Progress Notes (Signed)
 Critical results received from [facility] Radiology on brain MRI. Results verbally communicated to Tinnie Furth PA with CCM

## 2023-10-13 NOTE — Progress Notes (Signed)
 Unable to complete NIH stroke scale on patient d/t pt being intubated and sedated.

## 2023-10-13 NOTE — Progress Notes (Signed)
 PHARMACY - ANTICOAGULATION CONSULT NOTE  Pharmacy Consult for heparin  Indication: stroke  Allergies  Allergen Reactions   Benadryl  [Diphenhydramine ] Other (See Comments)    Extreme leg pain and extreme somnolent      Bisoprolol  Other (See Comments)    Confusion, delirium    Sulfa  Antibiotics Swelling and Other (See Comments)    Mouth swelling   Atorvastatin Other (See Comments)    Muscle aches - tolerating Pravastatin  40 mg MWF   Jardiance  [Empagliflozin ] Other (See Comments)    FEELS SLUGGISH, TIRED   Lopressor  [Metoprolol ] Other (See Comments)    Fatigue   Rosuvastatin Other (See Comments)    Muscle aches - tolerating Pravastatin  40 mg MWF   Doxycycline  Nausea Only   Temazepam  Other (See Comments)    Made insomnia worse    Tramadol  Itching    Patient Measurements: Height: 5' 6 (167.6 cm) Weight: 75 kg (165 lb 5.5 oz) IBW/kg (Calculated) : 63.8 HEPARIN  DW (KG): 72.2  Vital Signs: Temp: 97.7 F (36.5 C) (07/08 1000) Temp Source: Axillary (07/08 0800) BP: 151/58 (07/08 1100) Pulse Rate: 81 (07/08 1200)  Labs: Recent Labs    10/11/23 0516 10/12/23 0500 10/13/23 0545  HGB 8.9* 8.1* 9.0*  HCT 28.8* 26.9* 29.4*  PLT 413* 444* 478*  CREATININE 0.79 0.89 0.79    Estimated Creatinine Clearance: 69.8 mL/min (by C-G formula based on SCr of 0.79 mg/dL).   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   Anxiety    Arthritis    Asthma    Atrial flutter (HCC)    BPH (benign prostatic hyperplasia)    with urinary retention 02/06/20   CHF (congestive heart failure) (HCC)    COPD (chronic obstructive pulmonary disease) (HCC)    Depression    Diabetes mellitus, type II (HCC)    Dyspnea    GERD (gastroesophageal reflux disease)    History of blood transfusion    History of kidney stones    History of radiation therapy    right lung 09/10/2020-09/17/2020   Dr Shannon   History of radiation therapy    Left  and right Lung- 06/19/22-06/26/22   Hyperlipidemia    Hypertension    Hypothyroidism    IDA (iron  deficiency anemia)    Macular degeneration    Neuropathy    hands and feet   Non-small cell lung cancer, right (HCC) 04/18/2016   Oxygen  dependent    uses 2L oxygen  as needed   PAD (peripheral artery disease) (HCC) 03/2023   managed by Dr Lonni Gaskins   Peripheral vascular disease [facility])    Prostatitis    Pulmonary nodule, left 07/16/2016   Sleep apnea 2019   uses CPAP nightly     Assessment: 78 year old male recently admitted to [facility] for pneumonia, discharged on 7/4 and presented to [facility] on 7/5. Patient was started on antibiotics for pneumonia and intubated. Patient unable to be extubated due to encephalopathy. EEG negative for seizures. MRI brain with multiple punctate acute infarcts involving both hemispheres. Neurology consulted. Pharmacy consulted to manage heparin  infusion.  CBC stable. Previously on SQH, last dose 7/7 am.  Goal of Therapy:  Heparin  level 0.3-0.5 units/ml Monitor platelets by anticoagulation protocol: Yes   Plan:  -No heparin  bolus -Start heparin  infusion at 900 units/hr -Check heparin  level 8 hours after start of infusion -Daily CBC   Stefano MARLA Bologna, PharmD, BCPS Clinical Pharmacist 10/13/2023 2:10 PM

## 2023-10-13 NOTE — Progress Notes (Addendum)
 Afternoon round: Did okay on SBT today, but mental status precluding extubation. MRI performed showing multiple punctate strokes appearing embolic in nature. Neurology consulted and evaluated him. They ordered CTA head neck which is pending. Difficulty weaning his sedation as he gets very agitated and not following commands. Switched cefepime  to rocephin , adding seroquel  to try and help wean gtts. Called significant other, Dagoberto, for updates.   1809 Addendum: called from Radiology about CTA showing a 3mm SDH not reported on MRI. Additionally, critical stenosis of the right ICA. I called neurology back. Agreed to stop heparin  gtt at this time and they prefer formal neurosurgery opinion on anticoagulation and re-imaging. No intervention on right ICA at this time. Consulting nsgy and waiting call back

## 2023-10-13 NOTE — Progress Notes (Addendum)
 NAME:  John [name], MRN:  [id number], DOB:  21-Oct-1945, LOS: 3 ADMISSION DATE:  10/10/2023, CONSULTATION DATE: 10/10/2023 REFERRING MD: Fairy Sermon, MD, CHIEF COMPLAINT: Acute respiratory failure  History of Present Illness:  78 year old with history of adenocarcinoma of the lung and squamous cell carcinoma, COPD, asthma, OSA chronic respiratory failure on home oxygen , a flutter, CHF.  Recently admitted to [facility] ER for sepsis, hypoxic respiratory failure secondary to pneumonia, AKI.  Patient wanted an early discharge and sent home on 7/4 but returned to the emergency room today with generalized weakness, fall overnight.  Maintained on BiPAP, noted to have slowly downtrending hemoglobin which is being monitored.  Was altered in the ED.  Given ceftriaxone , azithromycin , Vanco, Zosyn , LR and intubated.  Transferred to ICU for further management.  Patient patient has an history of stage III A adenocarcinoma diagnosed in 2018 treated with radiation, carboplatin , paclitaxel  and durvalumab .  Unfortunately he continued to smoke and PET scan in March 2025 showed recurrence in bilateral upper and lower lobes which was confirmed as stage IV squamous cell cancer stage by bronchoscopic biopsy in April 2025.  He got 1 round of chemotherapy but did not tolerate anymore and was placed on nivolumab  infusion on 7/1.  Pertinent  Medical History    has a past medical history of Adenocarcinoma of lung, right (HCC) (04/18/2016), Anxiety, Arthritis, Asthma, Atrial flutter (HCC), BPH (benign prostatic hyperplasia), CHF (congestive heart failure) (HCC), COPD (chronic obstructive pulmonary disease) (HCC), Depression, Diabetes mellitus, type II (HCC), Dyspnea, GERD (gastroesophageal reflux disease), History of blood transfusion, History of kidney stones, History of radiation therapy, History of radiation therapy, Hyperlipidemia, Hypertension, Hypothyroidism, IDA (iron  deficiency anemia), Macular degeneration, Neuropathy,  Non-small cell lung cancer, right (HCC) (04/18/2016), Oxygen  dependent, PAD (peripheral artery disease) (HCC) (03/2023), Peripheral vascular disease (HCC), Prostatitis, Pulmonary nodule, left (07/16/2016), and Sleep apnea (2019).   Significant Hospital Events: Including procedures, antibiotic start and stop dates in addition to other pertinent events   7/1 - 10/09/2023 admitted to [facility] 7/5: transferred from [name] ED to [facility], intubated for acute hypoxic respiratory failure 7/6: off pressors  7/8: EEG yesterday negative, but continues to be very altered with weaning attempts. Getting MRI   Interim History / Subjective:  NAEON. Weans okay. With easing sedation he only becomes very agitated, not tracking or following commands. EEG and ammonia yesterday negative. Getting MRI today for further evaluation.   Objective    Blood pressure (!) 176/64, pulse 79, temperature 98.5 F (36.9 C), temperature source Axillary, resp. rate 15, height 5' 6 (1.676 m), weight 75 kg, SpO2 97%.    Vent Mode: PRVC FiO2 (%):  [30 %] 30 % Set Rate:  [16 bmp] 16 bmp Vt Set:  [510 mL] 510 mL PEEP:  [5 cmH20] 5 cmH20 Pressure Support:  [5 cmH20] 5 cmH20 Plateau Pressure:  [15 cmH20-17 cmH20] 15 cmH20   Intake/Output Summary (Last 24 hours) at 10/13/2023 0717 Last data filed at 10/13/2023 0536 Gross per 24 hour  Intake 2594.99 ml  Output 1300 ml  Net 1294.99 ml   Filed Weights   10/11/23 0500 10/12/23 0200 10/13/23 0439  Weight: 71.1 kg 72.2 kg 75 kg    Examination:. Gen: elderly male, laying in bed, critically ill appearing, nad  HEENT: ncat, anicteric sclera, perrla  Lungs: coarse, vented lung sounds bilaterally, vented on minimal settings CV:  s1s2, no m/r/g, no edema Abd: rounded, soft, ntnd Ext: no edema Neuro: Sedated, +gag/cough, does not wake to  voice or painful stimulus, does not follow commands, when opening eyes does not track provider   CTA with no PE, bilateral  opacities consistent with known lung cancer, basilar airspace disease, postobstructive pneumonia CT head, spine with no acute process. Chest x-rays forearm and hip with no fractures  Resolved problem list  Acute kidney injury   Assessment and Plan  Acute on chronic hypoxic, hypercarbic respiratory failure 2/2 COPD exacerbation, likely post-obstructive pneumonia, recurrent lung cancer  Sepsis, POA Received rocephin , azithromycin  at OSH. Intubated for hypoxic respiratory failure. CXR 7/6 with known masses. CT chest with masses, rather bulky lymphadenopathy, obstructive consolidation. Less concern for Nivolumab  induced pneumonitis as he received only 1 dose.  - continue vanc, cefepime   - continue steroids to cover pneumonitis anyway - methylpred 40 q12  - triple therapy wit yupelri , brovana , pulmicort   - follow cultures, no growth  - continue daily SAT/SBT to eval for extubation - mental status precluding - full mechanical vent support - lung protective ventilation 6-8cc/kg Vt - VAP and PAD bundle in place  - titrate FiO2 to sat goal >92  - maintain peak/plats <30, driving pressures <84    Acute metabolic encephalopathy  Has rhythmic motions of the legs while sedation is being weaned.  CT head on admission is negative. EEG 7/7 negative. Ammonia negative.  - MRI brain today  - add on b12, folate  - delirium precautions  - added back home gabapentin  yesterday in case having withdrawal symptoms - frequent neuro checks   Recurrent lung cancer Unable to tolerate chemotherapy with no plans on resuming.  Nivolumab  is on hold  Anemia of chronic illness Declining hemoglobin noted at [facility].  No evidence of active bleed - trend cbc  - transfuse <7   Hyperglycemia, steroid induced  - semglee  5 daily added - increased to resistant ssi scale  - cbg q4h   Chronic diastolic heart failure Hold outpatient meds including cardizem , torsemide , metolazone , spironolactone , propranolol   - can restart  home plavix  today  HLD - con't pravastatin  40mg  daily   Hypothyroid TSH/T4 checked and in range  - con't synthroid  50mcg daily   Decreased appetite, weight loss, failure to thrive Malnutrition, present on admission - tube feeds   Best Practice (right click and Reselect all SmartList Selections daily)   Diet/type: tubefeeds and NPO DVT prophylaxis DOAC GI prophylaxis: PPI Lines: N/A Foley:  Yes, and it is still needed Code Status:  full code Last date of multidisciplinary goals of care discussion []  Significant other Dagoberto updated over telephone 10/11/2023  Critical care time: 72   The patient is critically ill with multiple organ system failure and requires high complexity decision making for assessment and support, frequent evaluation and titration of therapies, advanced monitoring, review of radiographic studies and interpretation of complex data.   Critical Care Time devoted to patient care services, exclusive of separately billable procedures, described in this note is 35 minutes.   Tinnie FORBES Furth, PA-C [facility] 10/13/23 7:17 AM  Please see [email or web address] for pager details.  From 7A-7P if no response, please call [phone] After hours, please call ELink [phone]

## 2023-10-13 NOTE — Progress Notes (Signed)
 PHARMACY - ANTICOAGULATION CONSULT NOTE  Pharmacy Consult for heparin  Indication: stroke  Allergies  Allergen Reactions   Benadryl  [Diphenhydramine ] Other (See Comments)    Extreme leg pain and extreme somnolent      Bisoprolol  Other (See Comments)    Confusion, delirium    Sulfa  Antibiotics Swelling and Other (See Comments)    Mouth swelling   Atorvastatin Other (See Comments)    Muscle aches - tolerating Pravastatin  40 mg MWF   Jardiance  [Empagliflozin ] Other (See Comments)    FEELS SLUGGISH, TIRED   Lopressor  [Metoprolol ] Other (See Comments)    Fatigue   Rosuvastatin Other (See Comments)    Muscle aches - tolerating Pravastatin  40 mg MWF   Doxycycline  Nausea Only   Temazepam  Other (See Comments)    Made insomnia worse    Tramadol  Itching    Patient Measurements: Height: 5' 6 (167.6 cm) Weight: 75 kg (165 lb 5.5 oz) IBW/kg (Calculated) : 63.8 HEPARIN  DW (KG): 72.2  Vital Signs: Temp: 97.3 F (36.3 C) (07/08 1900) Temp Source: Axillary (07/08 0800) BP: 153/57 (07/08 1900) Pulse Rate: 75 (07/08 1900)  Labs: Recent Labs    10/11/23 0516 10/12/23 0500 10/13/23 0545  HGB 8.9* 8.1* 9.0*  HCT 28.8* 26.9* 29.4*  PLT 413* 444* 478*  CREATININE 0.79 0.89 0.79    Estimated Creatinine Clearance: 69.8 mL/min (by C-G formula based on SCr of 0.79 mg/dL).   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   Anxiety    Arthritis    Asthma    Atrial flutter (HCC)    BPH (benign prostatic hyperplasia)    with urinary retention 02/06/20   CHF (congestive heart failure) (HCC)    COPD (chronic obstructive pulmonary disease) (HCC)    Depression    Diabetes mellitus, type II (HCC)    Dyspnea    GERD (gastroesophageal reflux disease)    History of blood transfusion    History of kidney stones    History of radiation therapy    right lung 09/10/2020-09/17/2020   Dr Shannon   History of radiation therapy    Left  and right Lung- 06/19/22-06/26/22   Hyperlipidemia    Hypertension    Hypothyroidism    IDA (iron  deficiency anemia)    Macular degeneration    Neuropathy    hands and feet   Non-small cell lung cancer, right (HCC) 04/18/2016   Oxygen  dependent    uses 2L oxygen  as needed   PAD (peripheral artery disease) (HCC) 03/2023   managed by Dr Lonni Gaskins   Peripheral vascular disease [facility])    Prostatitis    Pulmonary nodule, left 07/16/2016   Sleep apnea 2019   uses CPAP nightly     Assessment: 78 year old male recently admitted to [facility] for pneumonia, discharged on 7/4 and presented to [facility] on 7/5. Patient was started on antibiotics for pneumonia and intubated. Patient unable to be extubated due to encephalopathy. EEG negative for seizures. MRI brain with multiple punctate acute infarcts involving both hemispheres. Neurology consulted.   Heparin  infusion started for patient on 10/13/23 at ~2p for embolic stroke and stopped at ~6p due to suspected subdural hematoma noted on head CTA. Per Tinnie Furth via [facility] msg , case was d/w neurosurgery and they are ok with resuming heparin  back for patient.   Goal of Therapy:  Heparin  level 0.3-0.5 units/ml Monitor platelets by anticoagulation protocol: Yes   Plan:  - Resume heparin   infusion back at 900 units/hr - Check heparin  level 8 hours after start of infusion - Daily CBC   Osie Iantha SQUIBB, PharmD, BCPS Clinical Pharmacist 10/13/2023 7:11 PM

## 2023-10-14 ENCOUNTER — Inpatient Hospital Stay (HOSPITAL_COMMUNITY): Admit: 2023-10-14 | Discharge: 2023-10-14 | Disposition: A | Discharge status: Home / Self Care | Attending: Neurology

## 2023-10-14 ENCOUNTER — Inpatient Hospital Stay (HOSPITAL_COMMUNITY)

## 2023-10-14 ENCOUNTER — Encounter (HOSPITAL_COMMUNITY): Payer: Self-pay | Admitting: Pulmonary Disease

## 2023-10-14 DIAGNOSIS — I639 Cerebral infarction, unspecified: Secondary | ICD-10-CM

## 2023-10-14 DIAGNOSIS — R569 Unspecified convulsions: Secondary | ICD-10-CM

## 2023-10-14 DIAGNOSIS — J9620 Acute and chronic respiratory failure, unspecified whether with hypoxia or hypercapnia: Secondary | ICD-10-CM

## 2023-10-14 DIAGNOSIS — Z85118 Personal history of other malignant neoplasm of bronchus and lung: Secondary | ICD-10-CM | POA: Diagnosis not present

## 2023-10-14 DIAGNOSIS — J441 Chronic obstructive pulmonary disease with (acute) exacerbation: Secondary | ICD-10-CM | POA: Diagnosis not present

## 2023-10-14 DIAGNOSIS — I5032 Chronic diastolic (congestive) heart failure: Secondary | ICD-10-CM | POA: Diagnosis not present

## 2023-10-14 DIAGNOSIS — J9622 Acute and chronic respiratory failure with hypercapnia: Secondary | ICD-10-CM | POA: Diagnosis not present

## 2023-10-14 DIAGNOSIS — J9621 Acute and chronic respiratory failure with hypoxia: Secondary | ICD-10-CM | POA: Diagnosis not present

## 2023-10-14 DIAGNOSIS — G9341 Metabolic encephalopathy: Secondary | ICD-10-CM | POA: Diagnosis not present

## 2023-10-14 LAB — GLUCOSE, CAPILLARY
Glucose-Capillary: 205 mg/dL — ABNORMAL HIGH (ref 70–99)
Glucose-Capillary: 209 mg/dL — ABNORMAL HIGH (ref 70–99)
Glucose-Capillary: 215 mg/dL — ABNORMAL HIGH (ref 70–99)
Glucose-Capillary: 239 mg/dL — ABNORMAL HIGH (ref 70–99)
Glucose-Capillary: 185 mg/dL — ABNORMAL HIGH (ref 70–99)
Glucose-Capillary: 189 mg/dL — ABNORMAL HIGH (ref 70–99)

## 2023-10-14 LAB — CBC
HCT: 28.5 % — ABNORMAL LOW (ref 39.0–52.0)
Hemoglobin: 8.6 g/dL — ABNORMAL LOW (ref 13.0–17.0)
MCH: 32.6 pg (ref 26.0–34.0)
MCHC: 30.2 g/dL (ref 30.0–36.0)
MCV: 108 fL — ABNORMAL HIGH (ref 80.0–100.0)
Platelets: 493 K/uL — ABNORMAL HIGH (ref 150–400)
RBC: 2.64 MIL/uL — ABNORMAL LOW (ref 4.22–5.81)
RDW: 17.4 % — ABNORMAL HIGH (ref 11.5–15.5)
WBC: 16 K/uL — ABNORMAL HIGH (ref 4.0–10.5)
nRBC: 0 % (ref 0.0–0.2)

## 2023-10-14 LAB — MAGNESIUM: Magnesium: 2.4 mg/dL (ref 1.7–2.4)

## 2023-10-14 LAB — BASIC METABOLIC PANEL WITH GFR
Anion gap: 6 (ref 5–15)
BUN: 29 mg/dL — ABNORMAL HIGH (ref 8–23)
CO2: 24 mmol/L (ref 22–32)
Calcium: 8.4 mg/dL — ABNORMAL LOW (ref 8.9–10.3)
Chloride: 105 mmol/L (ref 98–111)
Creatinine, Ser: 0.7 mg/dL (ref 0.61–1.24)
GFR, Estimated: >60 mL/min (ref >60)
Glucose, Bld: 225 mg/dL — ABNORMAL HIGH (ref 70–99)
Potassium: 3.6 mmol/L (ref 3.5–5.1)
Sodium: 135 mmol/L (ref 135–145)

## 2023-10-14 LAB — PHOSPHORUS: Phosphorus: 4.7 mg/dL — ABNORMAL HIGH (ref 2.5–4.6)

## 2023-10-14 LAB — LIPID PANEL
Cholesterol: 125 mg/dL (ref 0–200)
HDL: 23 mg/dL — ABNORMAL LOW (ref >40)
LDL Cholesterol: 58 mg/dL (ref 0–99)
Total CHOL/HDL Ratio: 5.4 ratio
Triglycerides: 222 mg/dL — ABNORMAL HIGH (ref <150)
VLDL: 44 mg/dL — ABNORMAL HIGH (ref 0–40)

## 2023-10-14 LAB — HEPARIN LEVEL (UNFRACTIONATED): Heparin Unfractionated: 0.16 [IU]/mL — ABNORMAL LOW (ref 0.30–0.70)

## 2023-10-14 MED ORDER — POTASSIUM CHLORIDE 20 MEQ PO PACK
40.0000 meq | PACK | Freq: Once | ORAL | Status: AC
  Administered 2023-10-14: 40 meq
  Filled 2023-10-14: qty 2

## 2023-10-14 MED ORDER — QUETIAPINE FUMARATE 50 MG PO TABS
25.0000 mg | ORAL_TABLET | Freq: Every day | ORAL | Status: DC
  Administered 2023-10-14 – 2023-10-17 (×4): 25 mg
  Filled 2023-10-14 (×4): qty 1

## 2023-10-14 MED ORDER — INSULIN ASPART 100 UNIT/ML IJ SOLN
3.0000 [IU] | INTRAMUSCULAR | Status: DC
  Administered 2023-10-14 (×3): 3 [IU] via SUBCUTANEOUS

## 2023-10-14 MED ORDER — GADOBUTROL 1 MMOL/ML IV SOLN
10.0000 mL | Freq: Once | INTRAVENOUS | Status: AC | PRN
  Administered 2023-10-14: 10 mL via INTRAVENOUS

## 2023-10-14 MED ORDER — INSULIN ASPART 100 UNIT/ML IJ SOLN
6.0000 [IU] | INTRAMUSCULAR | Status: DC
  Administered 2023-10-14 – 2023-10-15 (×4): 6 [IU] via SUBCUTANEOUS

## 2023-10-14 MED ORDER — STERILE WATER FOR INJECTION IJ SOLN
INTRAMUSCULAR | Status: AC
  Administered 2023-10-14: 10 mL
  Filled 2023-10-14: qty 10

## 2023-10-14 MED ORDER — INSULIN GLARGINE-YFGN 100 UNIT/ML ~~LOC~~ SOLN
5.0000 [IU] | Freq: Two times a day (BID) | SUBCUTANEOUS | Status: DC
  Administered 2023-10-14 – 2023-10-18 (×9): 5 [IU] via SUBCUTANEOUS
  Filled 2023-10-14 (×10): qty 0.05

## 2023-10-14 MED ORDER — PREDNISONE 20 MG PO TABS
40.0000 mg | ORAL_TABLET | Freq: Every day | ORAL | Status: AC
  Administered 2023-10-15 – 2023-10-16 (×2): 40 mg
  Filled 2023-10-14 (×2): qty 2

## 2023-10-14 NOTE — Procedures (Signed)
 Patient Name: John [name]  MRN: [id number]  Epilepsy Attending: Arlin MALVA Krebs  Referring Physician/Provider: Jerrie Lola CROME, MD  Date: 10/14/2023 Duration: 30.17 mins  Patient history:  78yo M with ams. EEG to evaluate for seizure   Level of alertness: awake   AEDs during EEG study: GBP   Technical aspects: This EEG was obtained using a 10 lead EEG system positioned circumferentially without any parasagittal coverage (rapid EEG). Computer selected EEG is reviewed as  well as background features and all clinically significant events.   Description: EEG showed continuous generalized 3 to 7 Hz theta-delta slowing, at times with triphasic morphology. Hyperventilation and photic stimulation were not performed.     Patient was noted to have brief leg jerk at 1615 and left arm jerking at 1618. Concomitant eeg before, during and after the event didn't show eeg change to suggest seizure.   ABNORMALITY - Continuous slow, generalized   IMPRESSION: This EEG is suggestive of moderate diffuse encephalopathy.  No seizures or definite epileptiform discharges were seen throughout the recording.  Patient was noted to have brief leg jerk at 1615 and left arm jerking at 1618 without concomitant eeg change. These events were most likely NON epileptic. Myoclonus can have similar appearance.   John [name]

## 2023-10-14 NOTE — Progress Notes (Signed)
 PT Cancellation Note  Patient Details Name: John [name] MRN: [id number] DOB: [birth year]   Cancelled Treatment:    Reason Eval/Treat Not Completed: Patient not medically ready Patient  remains on the vent and sedated. Will follow  for when patient is able to participate in therapy.  Darice Potters PT Acute Rehabilitation Services Office [phone]   [name] 10/14/2023, 8:17 AM

## 2023-10-14 NOTE — Progress Notes (Signed)
 PHARMACY - ANTICOAGULATION CONSULT NOTE  Pharmacy Consult for heparin  Indication: stroke  Allergies  Allergen Reactions   Benadryl  [Diphenhydramine ] Other (See Comments)    Extreme leg pain and extreme somnolent      Bisoprolol  Other (See Comments)    Confusion, delirium    Sulfa  Antibiotics Swelling and Other (See Comments)    Mouth swelling   Atorvastatin Other (See Comments)    Muscle aches - tolerating Pravastatin  40 mg MWF   Jardiance  [Empagliflozin ] Other (See Comments)    FEELS SLUGGISH, TIRED   Lopressor  [Metoprolol ] Other (See Comments)    Fatigue   Rosuvastatin Other (See Comments)    Muscle aches - tolerating Pravastatin  40 mg MWF   Doxycycline  Nausea Only   Temazepam  Other (See Comments)    Made insomnia worse    Tramadol  Itching    Patient Measurements: Height: 5' 6 (167.6 cm) Weight: 74 kg (163 lb 2.3 oz) IBW/kg (Calculated) : 63.8 HEPARIN  DW (KG): 72.2  Vital Signs: Temp: 97.8 F (36.6 C) (07/09 0329) Temp Source: Axillary (07/09 0329) BP: 150/59 (07/09 0100) Pulse Rate: 76 (07/09 0100)  Labs: Recent Labs    10/12/23 0500 10/13/23 0545 10/14/23 0418  HGB 8.1* 9.0* 8.6*  HCT 26.9* 29.4* 28.5*  PLT 444* 478* 493*  HEPARINUNFRC  --   --  0.16*  CREATININE 0.89 0.79 0.70    Estimated Creatinine Clearance: 69.8 mL/min (by C-G formula based on SCr of 0.7 mg/dL).   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   Anxiety    Arthritis    Asthma    Atrial flutter (HCC)    BPH (benign prostatic hyperplasia)    with urinary retention 02/06/20   CHF (congestive heart failure) (HCC)    COPD (chronic obstructive pulmonary disease) (HCC)    Depression    Diabetes mellitus, type II (HCC)    Dyspnea    GERD (gastroesophageal reflux disease)    History of blood transfusion    History of kidney stones    History of radiation therapy    right lung 09/10/2020-09/17/2020   Dr Shannon   History  of radiation therapy    Left and right Lung- 06/19/22-06/26/22   Hyperlipidemia    Hypertension    Hypothyroidism    IDA (iron  deficiency anemia)    Macular degeneration    Neuropathy    hands and feet   Non-small cell lung cancer, right (HCC) 04/18/2016   Oxygen  dependent    uses 2L oxygen  as needed   PAD (peripheral artery disease) (HCC) 03/2023   managed by Dr Lonni Gaskins   Peripheral vascular disease [facility])    Prostatitis    Pulmonary nodule, left 07/16/2016   Sleep apnea 2019   uses CPAP nightly     Assessment: 78 year old male recently admitted to [facility] for pneumonia, discharged on 7/4 and presented to [facility] on 7/5. Patient was started on antibiotics for pneumonia and intubated. Patient unable to be extubated due to encephalopathy. EEG negative for seizures. MRI brain with multiple punctate acute infarcts involving both hemispheres. Neurology consulted.   Heparin  infusion started for patient on 10/13/23 at ~2p for embolic stroke and stopped at ~6p due to suspected subdural hematoma noted on head CTA. Per Tinnie Furth via [facility] msg , case was d/w neurosurgery and they are ok with resuming heparin  back for patient.  10/14/2023 HL 0.16 subtherapeutic on 900 units/hr Hgb 8.6, plts 493 No bleeding  noted  Goal of Therapy:  Heparin  level 0.3-0.5 units/ml Monitor platelets by anticoagulation protocol: Yes   Plan:  - increase heparin  drip to 1100 units/hr - heparin  level in 8 hours - Daily CBC   Leeroy Mace RPh 10/14/2023, 5:16 AM

## 2023-10-14 NOTE — Progress Notes (Signed)
 EEG complete - results pending

## 2023-10-14 NOTE — Plan of Care (Signed)
  Problem: Nutritional: Goal: Maintenance of adequate nutrition will improve Outcome: Progressing   Problem: Coping: Goal: Ability to adjust to condition or change in health will improve Outcome: Not Progressing   Problem: Tissue Perfusion: Goal: Adequacy of tissue perfusion will improve Outcome: Not Progressing   Problem: Self-Care: Goal: Ability to communicate needs accurately will improve Outcome: Not Progressing

## 2023-10-14 NOTE — Progress Notes (Signed)
 [facility] ADULT ICU REPLACEMENT PROTOCOL   The patient does apply for the [facility] Adult ICU Electrolyte Replacment Protocol based on the criteria listed below:   1.Exclusion criteria: TCTS, ECMO, Dialysis, and Myasthenia Gravis patients 2. Is GFR >/= 30 ml/min? Yes.    Patient's GFR today is >60 3. Is SCr </= 2? Yes.   Patient's SCr is 0.7 mg/dL 4. Did SCr increase >/= 0.5 in 24 hours? No. 5.Pt's weight >40kg  Yes.   6. Abnormal electrolyte(s):   K 3.6  7. Electrolytes replaced per protocol 8.  Call MD STAT for K+ </= 2.5, Phos </= 1, or Mag </= 1 Physician:  CLEMENTEEN Epimenio Blackbird R Meagen Limones 10/14/2023 5:39 AM

## 2023-10-14 NOTE — Progress Notes (Signed)
 OT Cancellation Note  Patient Details Name: John [name] MRN: [id number] DOB: 06/28/1945   Cancelled Treatment:    Reason Eval/Treat Not Completed: Patient not medically ready Patient vented/sedated this day. OT will continue to follow acutely and complete initial evaluation once medically ready.   [name] OT/L Acute Rehabilitation Department  [phone]   10/14/2023, 8:25 AM

## 2023-10-14 NOTE — Progress Notes (Addendum)
 NAME:  John [name], MRN:  [id number], DOB:  10-31-1945, LOS: 4 ADMISSION DATE:  10/10/2023, CONSULTATION DATE: 10/10/2023 REFERRING MD: Fairy Sermon, MD, CHIEF COMPLAINT: Acute respiratory failure  History of Present Illness:  78 year old with history of adenocarcinoma of the lung and squamous cell carcinoma, COPD, asthma, OSA chronic respiratory failure on home oxygen , a flutter, CHF.  Recently admitted to [facility] ER for sepsis, hypoxic respiratory failure secondary to pneumonia, AKI.  Patient wanted an early discharge and sent home on 7/4 but returned to the emergency room today with generalized weakness, fall overnight.  Maintained on BiPAP, noted to have slowly downtrending hemoglobin which is being monitored.  Was altered in the ED.  Given ceftriaxone , azithromycin , Vanco, Zosyn , LR and intubated.  Transferred to ICU for further management.  Patient patient has an history of stage III A adenocarcinoma diagnosed in 2018 treated with radiation, carboplatin , paclitaxel  and durvalumab .  Unfortunately he continued to smoke and PET scan in March 2025 showed recurrence in bilateral upper and lower lobes which was confirmed as stage IV squamous cell cancer stage by bronchoscopic biopsy in April 2025.  He got 1 round of chemotherapy but did not tolerate anymore and was placed on nivolumab  infusion on 7/1.  Pertinent  Medical History    has a past medical history of Adenocarcinoma of lung, right (HCC) (04/18/2016), Anxiety, Arthritis, Asthma, Atrial flutter (HCC), BPH (benign prostatic hyperplasia), CHF (congestive heart failure) (HCC), COPD (chronic obstructive pulmonary disease) (HCC), Depression, Diabetes mellitus, type II (HCC), Dyspnea, GERD (gastroesophageal reflux disease), History of blood transfusion, History of kidney stones, History of radiation therapy, History of radiation therapy, Hyperlipidemia, Hypertension, Hypothyroidism, IDA (iron  deficiency anemia), Macular degeneration, Neuropathy,  Non-small cell lung cancer, right (HCC) (04/18/2016), Oxygen  dependent, PAD (peripheral artery disease) (HCC) (03/2023), Peripheral vascular disease (HCC), Prostatitis, Pulmonary nodule, left (07/16/2016), and Sleep apnea (2019).   Significant Hospital Events: Including procedures, antibiotic start and stop dates in addition to other pertinent events   7/1 - 10/09/2023 admitted to [facility] 7/5: transferred from [name] ED to [facility], intubated for acute hypoxic respiratory failure 7/6: off pressors  Had rhythmic motions of the legs while sedation is being weaned. EEG 7/7 negative. Ammonia negative. CT 7/8: EEG yesterday negative, but continues to be very altered with weaning attempts. Getting MRI  7/8 echo small pericardial effusion severe AS w/ decreased SV; IVC dilated w/ suggested RAP 15cmHg LVEF 55-60%; 7/9 repeat MRI w/ contrast was neg for acute findings. Neuro feels likely hypoxic injury driving myoclonic activity and delirium   Interim History / Subjective:    Objective    Blood pressure (!) 133/49, pulse 76, temperature 97.8 F (36.6 C), temperature source Axillary, resp. rate 13, height 5' 6 (1.676 m), weight 74 kg, SpO2 100%.    Vent Mode: PRVC FiO2 (%):  [30 %-100 %] 30 % Set Rate:  [16 bmp] 16 bmp Vt Set:  [510 mL] 510 mL PEEP:  [5 cmH20] 5 cmH20 Plateau Pressure:  [13 cmH20-16 cmH20] 16 cmH20   Intake/Output Summary (Last 24 hours) at 10/14/2023 0720 Last data filed at 10/14/2023 0709 Gross per 24 hour  Intake 3072.89 ml  Output 1610 ml  Net 1462.89 ml   Filed Weights   10/12/23 0200 10/13/23 0439 10/14/23 0441  Weight: 72.2 kg 75 kg 74 kg    Examination:.  General chronically ill appearing 78 year old male sedated on low dose dex HENT scleral edema. No JVD opens eyes Pulm diffuse wheeze. No  accessory use. Wants higher VT. PSV 10 pulling 800 ml Card rrr w/ faint systolic HM c/w identified AS on echo Abd soft Ext warm dry brisk CR Neuro  sedated. Will follow commands in all ext. Sticks out tongue still has intermittent episodes of myoclonic jerking  Gu cl yellow   Resolved problem list  Acute kidney injury   Assessment and Plan  Acute on chronic hypoxic, hypercarbic respiratory failure 2/2 COPD exacerbation, likely post-obstructive pneumonia, recurrent lung cancer  Sepsis, POA -felt not c/w  Nivolumab  induced pneumonitis as he received only 1 dose. -MRSA pcr +. 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 Neuro feels hypoxic injury likely driver of myoclonic activity noted but still not clear about origin of the embolic injuries. ? Endocarditis vs A flutter (had ablation in past) plan Holding systemic heparin  for now given concern for hemorrhagic conversion if this is embolic stroke from endocarditis  Asking cards to see & consider TEE to r/o endocarditis as etiology of embolic injury delirium precautions  Minimize sedation to lowest dosing able and then Repeat EEG Dec seroquel  to 25 q hs added back home gabapentin  yesterday in case having withdrawal symptoms frequent neuro checks   Recurrent lung cancer Unable to tolerate chemotherapy with no plans on resuming.  Nivolumab  is on hold Plan OP follow up  Chronic diastolic heart failure, h/o aflutter and now w/  newly identified AS and h/o PAD s/p multiple procedures; most recently involving angioplasty and stenting of Right SFA and external iliac artery  Plan Holding outpatient meds including cardizem , torsemide , metolazone , spironolactone , propranolol   Restarted home plavix  7/8 Asking cards to see re: possible TEE  Can also better evaluate his AS   Anemia of chronic illness Declining hemoglobin noted at [facility].  No evidence of active bleed->stable for last 24hrs Plan trend cbc  transfuse <7   Hyperglycemia, steroid induced  - semglee  5 daily added 7/8; glycemic control still sub-optimal Plan Cont  resistant ssi scale  cbg q4h  Adding low dose TF coverage [employer] semglee  to BID (5units)  HLD Plan cont pravastatin  40mg  daily   Hypothyroid plan synthroid  50mcg daily   Decreased appetite, weight loss, failure to thrive Malnutrition (POA) -on vital 1.2 @ 54ml/hr Plan Cont tubefeeds   Best Practice (right click and Reselect all SmartList Selections daily)   Diet/type: tubefeeds and NPO DVT prophylaxis DOAC GI prophylaxis: PPI Lines: N/A Foley:  Yes, and it is still needed Code Status:  full code Last date of multidisciplinary goals of care discussion []  Significant other Dagoberto updated over telephone 10/11/2023  Critical care time:  45 min

## 2023-10-14 NOTE — Progress Notes (Addendum)
 Discussed with Dr. Meade,   Will repeat EEG, off of sedation or at least with minimized sedation to better characterize movements (was on propofol  before, now on precedex  and fentanyl  which certainly will not affect seizure activity at the least but would like to capture movements)  MRI brain personally reviewed, agree negative for evidence of metastatic disease or abnormal contrast enhancement   Additionally, would appreciate cardiology evaluation for any afib/flutter not reported on his device checks so far -- if he has not had clear afib/flutter, given infectious concerns, also need to consider culture negative endocarditis as etiology and would need TEE to definitively exclude endocarditis as an etiology. Due to high risk of hemorrhage with endocarditis, would hold off on anticoagulation and antiplatelets until endocarditis is excluded or until based on clinical course / workup is felt to be highly unlikely.   Neurology will follow workup remotely, 12 min spent in care, majority in discussion with Dr. Meade John Jernigan MD-PhD [facility] [phone]

## 2023-10-14 NOTE — Progress Notes (Addendum)
 MRI has been contacted multiple times with no success, to coordinate STAT imaging for patient. Will pass this onto day shift RN that patient was not able to be taken down to MRI. CT technician informed RN that MRI leaves @ 1900 and that pt would need to be taken to [facility] for STAT MRI to be done.

## 2023-10-14 NOTE — Progress Notes (Signed)
 Unable to complete Q4 NIH stroke scale assessment d/t patient being intubated and sedated

## 2023-10-14 NOTE — Progress Notes (Signed)
 Chaplain met with pt's family and offered spiritual care services, however family politely expressed no needs at this time. Chaplains remain available.

## 2023-10-14 NOTE — Progress Notes (Signed)
RT transported pt to and from MRI without complications. ?

## 2023-10-14 NOTE — Progress Notes (Signed)
 SLP Cancellation Note  Patient Details Name: SPARROW SANZO MRN: [id number] DOB: Jul 03, 1945   Cancelled treatment:       Reason Eval/Treat Not Completed: Medical issues which prohibited therapy; pt is on ventilator and unable to participate; ST will continue efforts as pt able.   Pat Jace Fermin,M.S.,CCC-SLP 10/14/2023, 9:47 AM

## 2023-10-14 NOTE — Progress Notes (Signed)
 Cardiology Consultation:   Patient ID: FAIN FRANCIS; [id number]; [birth year]   Admit date: 10/10/2023 Date of Consult: 10/14/2023  Primary Care Provider: Lavell Bari LABOR, FNP Primary Cardiologist: Darryle ONEIDA Decent, MD  Primary Electrophysiologist:  OLE ONEIDA HOLTS, MD    Patient Profile:   John [name] is a 78 y.o. male with a hx of typical atrial flutter s/p ablation 01/2022, chronic anemia, chronic HFpEF, HTN, HLD, advanced COPD on chronic oxygen , PAD s/p right femoral  endarterectomy on 08/2020 and right common iliac artery stent on 08/2020, non-small cell lung CA s/p chemoradiation therapy in 2018,  who is being seen 10/14/2023 for the evaluation of history of A. Flutter with newly discovered AS at the request of Dr. Meade.   History of Present Illness:   Mr. Beegle past medical history as stated above. He presented to [facility] ED on 10/10/2023 complaining of shortness of breath, weakness, fall after being recently discharged early per patient request from [facility] 7/1-10/09/2023 after being admitted for sepsis, hypoxic respiratory failure secondary to pneumonia, AKI. He was found to be altered while in the ED.    He was transferred to [facility] and was intubated for hypoxic respiratory failure on 7/5. He was started on pressors, and came off pressors 7/6. He has been admitted in the ICU for acute on chronic hypoxic, hypercarbic respiratory failure thought be to secondary to COPD exacerbation with post-obstructive PNA, recurrent lung cancer, sepsis, acute metabolic encephalopathy, embolic stroke.    He has been followed closely by neurology and critical care. Cardiology was consulted in the setting of known history of A. Flutter with question of recurrence, along with newly discovered aortic stenosis, and request for TEE.   He has had CVA and there is a question of endocarditis.      Past Medical History:  Diagnosis Date   Adenocarcinoma of lung, right (HCC)  04/18/2016   Anxiety    Arthritis    Asthma    Atrial flutter (HCC)    BPH (benign prostatic hyperplasia)    with urinary retention 02/06/20   CHF (congestive heart failure) (HCC)    COPD (chronic obstructive pulmonary disease) (HCC)    Depression    Diabetes mellitus, type II (HCC)    Dyspnea    GERD (gastroesophageal reflux disease)    History of blood transfusion    History of kidney stones    History of radiation therapy    right lung 09/10/2020-09/17/2020   Dr Shannon   History of radiation therapy    Left and right Lung- 06/19/22-06/26/22   Hyperlipidemia    Hypertension    Hypothyroidism    IDA (iron  deficiency anemia)    Macular degeneration    Neuropathy    hands and feet   Non-small cell lung cancer, right (HCC) 04/18/2016   Oxygen  dependent    uses 2L oxygen  as needed   PAD (peripheral artery disease) (HCC) 03/2023   managed by Dr Lonni Gaskins   Peripheral vascular disease [facility])    Prostatitis    Pulmonary nodule, left 07/16/2016   Sleep apnea 2019   uses CPAP nightly    Past Surgical History:  Procedure Laterality Date   A-FLUTTER ABLATION N/A 01/13/2022   Procedure: A-FLUTTER ABLATION;  Surgeon: HOLTS OLE ONEIDA, MD;  Location: [facility] INVASIVE CV LAB;  Service: Cardiovascular;  Laterality: N/A;   ABDOMINAL AORTOGRAM W/LOWER EXTREMITY Left 02/06/2020   Procedure: ABDOMINAL AORTOGRAM W/LOWER EXTREMITY;  Surgeon: Court Dorn PARAS, MD;  Location: [facility] INVASIVE CV LAB;  Service: Cardiovascular;  Laterality: Left;   ABDOMINAL AORTOGRAM W/LOWER EXTREMITY N/A 05/30/2021   Procedure: ABDOMINAL AORTOGRAM W/LOWER EXTREMITY;  Surgeon: Gretta Lonni PARAS, MD;  Location: [facility] INVASIVE CV LAB;  Service: Cardiovascular;  Laterality: N/A;   ABDOMINAL AORTOGRAM W/LOWER EXTREMITY N/A 02/05/2023   Procedure: ABDOMINAL AORTOGRAM W/LOWER EXTREMITY;  Surgeon: Gretta Lonni PARAS, MD;  Location: [facility] INVASIVE CV LAB;  Service: Cardiovascular;  Laterality: N/A;   BIOPSY  09/16/2021    Procedure: BIOPSY;  Surgeon: Avram Lupita BRAVO, MD;  Location: [facility] ENDOSCOPY;  Service: Gastroenterology;;   BRONCHIAL BIOPSY  08/03/2023   Procedure: BRONCHOSCOPY, WITH BIOPSY;  Surgeon: Shelah Lamar RAMAN, MD;  Location: [facility] ENDOSCOPY;  Service: Pulmonary;;   BRONCHIAL BRUSHINGS  08/03/2023   Procedure: BRONCHOSCOPY, WITH BRUSH BIOPSY;  Surgeon: Shelah Lamar RAMAN, MD;  Location: [facility] ENDOSCOPY;  Service: Pulmonary;;   BRONCHIAL NEEDLE ASPIRATION BIOPSY  08/03/2023   Procedure: BRONCHOSCOPY, WITH NEEDLE ASPIRATION BIOPSY;  Surgeon: Shelah Lamar RAMAN, MD;  Location: [facility] ENDOSCOPY;  Service: Pulmonary;;   CARDIOVERSION N/A 10/05/2020   Procedure: CARDIOVERSION;  Surgeon: Barbaraann Darryle Ned, MD;  Location: [facility] ENDOSCOPY;  Service: Cardiovascular;  Laterality: N/A;   CATARACT EXTRACTION, BILATERAL Bilateral    COLONOSCOPY WITH PROPOFOL  N/A 06/20/2021   Procedure: COLONOSCOPY WITH PROPOFOL ;  Surgeon: Abran Norleen SAILOR, MD;  Location: [facility] ENDOSCOPY;  Service: Endoscopy;  Laterality: N/A;   COLONOSCOPY WITH PROPOFOL  N/A 09/16/2021   Procedure: COLONOSCOPY WITH PROPOFOL ;  Surgeon: Avram Lupita BRAVO, MD;  Location: [facility] ENDOSCOPY;  Service: Gastroenterology;  Laterality: N/A;   ENDARTERECTOMY FEMORAL Right 08/20/2020   Procedure: ENDARTERECTOMY  RIGHT FEMORAL ARTERY;  Surgeon: Gretta Lonni PARAS, MD;  Location: [facility] OR;  Service: Vascular;  Laterality: Right;   ENTEROSCOPY N/A 10/11/2021   Procedure: ENTEROSCOPY;  Surgeon: Eartha Angelia Sieving, MD;  Location: [facility] ENDO SUITE;  Service: Gastroenterology;  Laterality: N/A;   ENTEROSCOPY N/A 02/18/2023   Procedure: ENTEROSCOPY;  Surgeon: Shaaron Lamar HERO, MD;  Location: [facility] ENDO SUITE;  Service: Endoscopy;  Laterality: N/A;  EGD with possible small bowel push enteroscopy   ESOPHAGOGASTRODUODENOSCOPY N/A 09/16/2021   Procedure: ESOPHAGOGASTRODUODENOSCOPY (EGD);  Surgeon: Avram Lupita BRAVO, MD;  Location: [facility] ENDOSCOPY;  Service: Gastroenterology;  Laterality: N/A;    ESOPHAGOGASTRODUODENOSCOPY (EGD) WITH PROPOFOL  N/A 09/06/2020   Procedure: ESOPHAGOGASTRODUODENOSCOPY (EGD) WITH PROPOFOL ;  Surgeon: Abran Norleen SAILOR, MD;  Location: [facility] ENDOSCOPY;  Service: Endoscopy;  Laterality: N/A;   ESOPHAGOGASTRODUODENOSCOPY (EGD) WITH PROPOFOL  N/A 10/11/2021   Procedure: ESOPHAGOGASTRODUODENOSCOPY (EGD) WITH PROPOFOL ;  Surgeon: Eartha Angelia Sieving, MD;  Location: [facility] ENDO SUITE;  Service: Gastroenterology;  Laterality: N/A;   ESOPHAGOGASTRODUODENOSCOPY (EGD) WITH PROPOFOL  N/A 02/18/2023   Procedure: ESOPHAGOGASTRODUODENOSCOPY (EGD) WITH PROPOFOL ;  Surgeon: Shaaron Lamar HERO, MD;  Location: [facility] ENDO SUITE;  Service: Endoscopy;  Laterality: N/A;   GIVENS CAPSULE STUDY N/A 02/19/2023   Procedure: GIVENS CAPSULE STUDY;  Surgeon: Cinderella Deatrice FALCON, MD;  Location: [facility] ENDO SUITE;  Service: Endoscopy;  Laterality: N/A;   HEMOSTASIS CLIP PLACEMENT  09/16/2021   Procedure: HEMOSTASIS CLIP PLACEMENT;  Surgeon: Avram Lupita BRAVO, MD;  Location: [facility] ENDOSCOPY;  Service: Gastroenterology;;   HOT HEMOSTASIS N/A 09/16/2021   Procedure: HOT HEMOSTASIS (ARGON PLASMA COAGULATION/BICAP);  Surgeon: Avram Lupita BRAVO, MD;  Location: [facility] ENDOSCOPY;  Service: Gastroenterology;  Laterality: N/A;   HOT HEMOSTASIS  10/11/2021   Procedure: HOT HEMOSTASIS (ARGON PLASMA COAGULATION/BICAP);  Surgeon: Eartha Angelia, Sieving, MD;  Location: [facility] ENDO SUITE;  Service: Gastroenterology;;   HOT HEMOSTASIS  02/18/2023   Procedure: HOT HEMOSTASIS (ARGON PLASMA COAGULATION/BICAP);  Surgeon: Shaaron Lamar HERO, MD;  Location: [facility] ENDO SUITE;  Service: Endoscopy;;   INSERTION OF ILIAC STENT Right 08/20/2020   Procedure: RETROGRADE INSERTION OF RIGHT ILIAC STENT;  Surgeon: Gretta Lonni PARAS, MD;  Location: [facility] OR;  Service: Vascular;  Laterality: Right;   INTRAOPERATIVE ARTERIOGRAM Right 08/20/2020   Procedure: INTRA OPERATIVE ARTERIOGRAM ILIAC;  Surgeon: Gretta Lonni PARAS, MD;  Location: [facility] OR;  Service: Vascular;   Laterality: Right;   PATCH ANGIOPLASTY Right 08/20/2020   Procedure: PATCH ANGIOPLASTY RIGHT FEMORAL ARTERY;  Surgeon: Gretta Lonni PARAS, MD;  Location: [facility] OR;  Service: Vascular;  Laterality: Right;   PERIPHERAL VASCULAR BALLOON ANGIOPLASTY Right 05/30/2021   Procedure: PERIPHERAL VASCULAR BALLOON ANGIOPLASTY;  Surgeon: Gretta Lonni PARAS, MD;  Location: [facility] INVASIVE CV LAB;  Service: Cardiovascular;  Laterality: Right;   PERIPHERAL VASCULAR INTERVENTION Right 02/05/2023   Procedure: PERIPHERAL VASCULAR INTERVENTION;  Surgeon: Gretta Lonni PARAS, MD;  Location: [facility] INVASIVE CV LAB;  Service: Cardiovascular;  Laterality: Right;  Rt SFA and Rt Ext Iliac   PORTACATH PLACEMENT Left 06/13/2016   Procedure: INSERTION PORT-A-CATH;  Surgeon: Oneil Budge, MD;  Location: [facility] ORS;  Service: General;  Laterality: Left;   TRANSURETHRAL RESECTION OF PROSTATE N/A 05/31/2020   Procedure: TRANSURETHRAL RESECTION OF THE PROSTATE (TURP);  Surgeon: Sherrilee Belvie CROME, MD;  Location: [facility] ORS;  Service: Urology;  Laterality: N/A;   VIDEO BRONCHOSCOPY WITH ENDOBRONCHIAL NAVIGATION N/A 05/28/2016   Procedure: VIDEO BRONCHOSCOPY WITH ENDOBRONCHIAL NAVIGATION;  Surgeon: Elspeth JAYSON Millers, MD;  Location: [facility] OR;  Service: Thoracic;  Laterality: N/A;   VIDEO BRONCHOSCOPY WITH ENDOBRONCHIAL NAVIGATION Bilateral 08/03/2023   Procedure: VIDEO BRONCHOSCOPY WITH ENDOBRONCHIAL NAVIGATION;  Surgeon: Shelah Lamar RAMAN, MD;  Location: [facility] ENDOSCOPY;  Service: Pulmonary;  Laterality: Bilateral;  WITH FLUORO   VIDEO BRONCHOSCOPY WITH ENDOBRONCHIAL ULTRASOUND N/A 05/28/2016   Procedure: VIDEO BRONCHOSCOPY WITH ENDOBRONCHIAL ULTRASOUND;  Surgeon: Elspeth JAYSON Millers, MD;  Location: [facility] OR;  Service: Thoracic;  Laterality: N/A;     Home Medications:  Prior to Admission medications   Medication Sig Start Date End Date Taking? Authorizing Provider  albuterol  (VENTOLIN  HFA) 108 (90 Base) MCG/ACT inhaler INHALE 2 PUFFS INTO THE LUNGS  EVERY 6 HOURS AS NEEDED FOR WHEEZE OR SHORTNESS OF BREATH 09/09/23  Yes Hawks, Christy A, FNP  allopurinol  (ZYLOPRIM ) 300 MG tablet Take 1 tablet (300 mg total) by mouth daily. 09/04/23  Yes Hawks, Christy A, FNP  Blood Glucose Monitoring Suppl (ONETOUCH VERIO REFLECT) w/Device KIT Use to test blood sugars daily as directed. DX: E11.9 10/14/19  Yes Hawks, Christy A, FNP  clopidogrel  (PLAVIX ) 75 MG tablet TAKE 1 TABLET BY MOUTH EVERY DAY 09/10/23  Yes Gretta Lonni PARAS, MD  Continuous Glucose Receiver (FREESTYLE LIBRE 3 READER) DEVI 1 Device by Does not apply route every 14 (fourteen) days. 08/11/22  Yes Hawks, Christy A, FNP  Continuous Glucose Sensor (FREESTYLE LIBRE 3 SENSOR) MISC 1 Device by Does not apply route continuous. 08/11/22  Yes Hawks, Christy A, FNP  diltiazem  (CARDIZEM  CD) 120 MG 24 hr capsule Take 1 capsule (120 mg total) by mouth daily as needed. 08/19/23  Yes Lesia Ozell Barter, PA-C  docusate sodium  (COLACE) 100 MG capsule Take 1 capsule (100 mg total) by mouth daily as needed for mild constipation. 05/31/23 05/30/24 Yes Cheryle Page, MD  dutasteride  (AVODART ) 0.5 MG capsule Take 1 capsule (0.5 mg total) by mouth daily. 10/31/22  Yes McKenzie, Belvie CROME, MD  famotidine  (PEPCID ) 20 MG tablet Take 1 tablet (20 mg total) by mouth daily. 02/19/23 05/29/24 Yes Shah, Pratik D, DO  ferrous sulfate  325 (65 FE) MG EC tablet Take 1 tablet (325 mg total) by mouth daily. 05/31/23 05/30/24 Yes Cheryle Page, MD  Fluticasone -Umeclidin-Vilant (TRELEGY ELLIPTA ) 100-62.5-25 MCG/ACT AEPB TAKE 1 PUFF BY MOUTH EVERY DAY 09/09/23  Yes Hawks, Christy A, FNP  gabapentin  (NEURONTIN ) 300 MG capsule Take 1 capsule (300 mg total) by mouth 3 (three) times daily. 09/04/23  Yes Hawks, Christy A, FNP  insulin  degludec (TRESIBA  FLEXTOUCH) 100 UNIT/ML FlexTouch Pen Inject 20 Units into the skin at bedtime. 08/24/23  Yes Therisa Benton PARAS, NP  Insulin  Pen Needle (BD PEN NEEDLE NANO U/F) 32G X 4 MM MISC Use to inject insulin   once daily 08/24/23  Yes Reardon, Benton PARAS, NP  levothyroxine  (SYNTHROID ) 50 MCG tablet Take 1 tablet (50 mcg total) by mouth daily. 08/24/23  Yes Therisa Benton PARAS, NP  linaclotide  (LINZESS ) 72 MCG capsule Take 1 capsule (72 mcg total) by mouth daily before breakfast. 04/30/23  Yes Hawks, Christy A, FNP  megestrol  (MEGACE ) 400 MG/10ML suspension Take 10 mLs (400 mg total) by mouth 2 (two) times daily. 09/04/23  Yes Hawks, Christy A, FNP  metolazone  (ZAROXOLYN ) 2.5 MG tablet Take 1 tablet (2.5 mg total) by mouth 3 (three) times a week. 08/26/23  Yes O'Neal, Darryle Ned, MD  mupirocin  ointment (BACTROBAN ) 2 % Apply topically 2 (two) times daily. 08/27/23  Yes [provider]  NON FORMULARY CPAP at bedtime   Yes [provider]  ondansetron  (ZOFRAN ) 4 MG tablet Take 1 tablet (4 mg total) by mouth every 8 (eight) hours as needed for nausea or vomiting. 04/30/23  Yes Hawks, Christy A, FNP  OneTouch Delica Lancets 30G MISC Use to test blood sugars daily as directed. DX: E11.9 02/24/22  Yes Therisa Benton PARAS, NP  OXYGEN  Inhale 2 L into the lungs daily as needed (with Cpap at night).   Yes [provider]  pantoprazole  (PROTONIX ) 40 MG tablet Take 1 tablet (40 mg total) by mouth daily. 07/30/23  Yes Hawks, Christy A, FNP  pravastatin  (PRAVACHOL ) 40 MG tablet TAKE 1 TABLET (40 MG TOTAL) BY MOUTH ON MONDAYS , WEDNESDAYS, AND FRIDAYS AS DIRECTED 07/30/23  Yes Hawks, Christy A, FNP  prochlorperazine  (COMPAZINE ) 5 MG tablet Take 1 tablet (5 mg total) by mouth every 6 (six) hours as needed for nausea or vomiting. 09/24/23  Yes Rogers Hai, MD  propranolol  (INDERAL ) 10 MG tablet Take 1 tablet (10 mg total) by mouth 3 (three) times daily as needed. 04/27/23  Yes Jerilynn Lamarr HERO, NP  spironolactone  (ALDACTONE ) 25 MG tablet Take 1 tablet (25 mg total) by mouth 2 (two) times daily. 08/19/23  Yes Jerilynn Lamarr HERO, NP  torsemide  (DEMADEX ) 20 MG tablet TAKE 1/2 TABLET BY MOUTH DAILY  09/09/23  Yes Hawks, Bari A, FNP  zolpidem  (AMBIEN ) 5 MG tablet Take 1 tablet (5 mg total) by mouth at bedtime as needed for sleep. 09/04/23  Yes Hawks, Christy A, FNP  cephALEXin  (KEFLEX ) 500 MG capsule Take 500 mg by mouth 3 (three) times daily. Patient not taking: Reported on 10/11/2023 09/22/23   [provider]    Inpatient Medications: Scheduled Meds:   stroke: early stages of recovery book   Does not apply Once   arformoterol   15 mcg Nebulization BID   budesonide  (PULMICORT ) nebulizer solution  0.25 mg Nebulization BID   Chlorhexidine  Gluconate Cloth  6 each  Topical Q2200   clopidogrel   75 mg Per Tube Daily   docusate  100 mg Per Tube BID   famotidine   20 mg Per Tube BID   fentaNYL  (SUBLIMAZE ) injection  25-50 mcg Intravenous Once   gabapentin   300 mg Per Tube TID   insulin  aspart  0-20 Units Subcutaneous Q4H   insulin  aspart  3 Units Subcutaneous Q4H   insulin  glargine-yfgn  5 Units Subcutaneous BID   levothyroxine   50 mcg Per Tube Q0600   mupirocin  ointment  1 Application Nasal BID   mouth rinse  15 mL Mouth Rinse Q2H   pantoprazole  (PROTONIX ) IV  40 mg Intravenous Daily   polyethylene glycol  17 g Per Tube Daily   pravastatin   40 mg Per Tube Once per day on Monday Wednesday Friday   [START ON 10/15/2023] predniSONE   40 mg Per Tube Q breakfast   QUEtiapine   25 mg Per Tube QHS   revefenacin   175 mcg Nebulization Daily   Continuous Infusions:  azithromycin  Stopped (10/13/23 1357)   cefTRIAXone  (ROCEPHIN )  IV 2 g (10/14/23 0934)   dexmedetomidine  (PRECEDEX ) IV infusion 0.6 mcg/kg/hr (10/14/23 0709)   feeding supplement (VITAL AF 1.2 CAL) 60 mL/hr at 10/14/23 0709   fentaNYL  infusion INTRAVENOUS 125 mcg/hr (10/14/23 0709)   vancomycin  Stopped (10/13/23 1848)   PRN Meds: docusate sodium , fentaNYL , hydrALAZINE , mouth rinse, polyethylene glycol, sodium chloride  flush  Allergies:    Allergies  Allergen Reactions   Benadryl  [Diphenhydramine ] Other (See Comments)     Extreme leg pain and extreme somnolent      Bisoprolol  Other (See Comments)    Confusion, delirium    Sulfa  Antibiotics Swelling and Other (See Comments)    Mouth swelling   Atorvastatin Other (See Comments)    Muscle aches - tolerating Pravastatin  40 mg MWF   Jardiance  [Empagliflozin ] Other (See Comments)    FEELS SLUGGISH, TIRED   Lopressor  [Metoprolol ] Other (See Comments)    Fatigue   Rosuvastatin Other (See Comments)    Muscle aches - tolerating Pravastatin  40 mg MWF   Doxycycline  Nausea Only   Temazepam  Other (See Comments)    Made insomnia worse    Tramadol  Itching    Social History:   Social History   Socioeconomic History   Marital status: Significant Other    Spouse name: Not on file   Number of children: 5   Years of education: 10   Highest education level: 10th grade  Occupational History   Occupation: Retired  Tobacco Use   Smoking status: Every Day    Current packs/day: 0.50    Average packs/day: 0.5 packs/day for 62.6 years (31.3 ttl pk-yrs)    Types: Cigarettes    Start date: 03/11/1961   Smokeless tobacco: Never   Tobacco comments:    1/2 pack to 1 pack per day- 07/30/2023, informed to not smoke 24 hrs prior to procedure.  Vaping Use   Vaping status: Never Used  Substance and Sexual Activity   Alcohol use: No   Drug use: No   Sexual activity: Yes    Birth control/protection: None  Other Topics Concern   Not on file  Social History Narrative   Dagoberto is his POA and lives with him. Children out of town - one in [place] [facility], others in [place].   Social Drivers of Corporate investment banker Strain: Low Risk  (10/07/2023)   Received from [facility]   Overall Financial Resource Strain (CARDIA)    How  hard is it for you to pay for the very basics like food, housing, medical care, and heating?: Not hard at all  Food Insecurity: Patient Unable To Answer (10/11/2023)   Hunger Vital Sign    Worried About Running Out of Food in the Last Year: Patient  unable to answer    Ran Out of Food in the Last Year: Patient unable to answer  Transportation Needs: Patient Unable To Answer (10/11/2023)   PRAPARE - Transportation    Lack of Transportation (Medical): Patient unable to answer    Lack of Transportation (Non-Medical): Patient unable to answer  Physical Activity: Inactive (10/07/2023)   Received from [facility]   Exercise Vital Sign    On average, how many days per week do you engage in moderate to strenuous exercise (like a brisk walk)?: 0 days    On average, how many minutes do you engage in exercise at this level?: 0 min  Stress: Stress Concern Present (10/07/2023)   Received from [facility] - Occupational Stress Questionnaire    Do you feel stress - tense, restless, nervous, or anxious, or unable to sleep at night because your mind is troubled all the time - these days?: To some extent  Social Connections: Patient Unable To Answer (10/11/2023)   Social Connection and Isolation Panel    Frequency of Communication with Friends and Family: Patient unable to answer    Frequency of Social Gatherings with Friends and Family: Patient unable to answer    Attends Religious Services: Patient unable to answer    Active Member of Clubs or Organizations: Patient unable to answer    Attends Banker Meetings: Patient unable to answer    Marital Status: Patient unable to answer  Recent Concern: Social Connections - Moderately Isolated (10/07/2023)   Received from [facility]   Social Connection and Isolation Panel    In a typical week, how many times do you talk on the phone with family, friends, or neighbors?: Once a week    How often do you get together with friends or relatives?: Once a week    How often do you attend church or religious services?: 1 to 4 times per year    Do you belong to any clubs or organizations such as church groups, unions, fraternal or athletic groups, or school  groups?: No    How often do you attend meetings of the clubs or organizations you belong to?: Never    Are you married, widowed, divorced, separated, never married, or living with a partner?: Living with partner  Intimate Partner Violence: Patient Unable To Answer (10/11/2023)   Humiliation, Afraid, Rape, and Kick questionnaire    Fear of Current or Ex-Partner: Patient unable to answer    Emotionally Abused: Patient unable to answer    Physically Abused: Patient unable to answer    Sexually Abused: Patient unable to answer    Family History:    Family History  Problem Relation Age of Onset   Hypertension Mother    Diabetes Father    Heart disease Father    Stroke Father    Hypertension Sister    Sleep apnea Neg Hx      ROS:  Unable to obtain with patient intubated.   Physical Exam/Data:   Vitals:   10/14/23 0700 10/14/23 0744 10/14/23 0800 10/14/23 1130  BP:   (!) 111/49   Pulse: 76  69   Resp: 13  17   Temp:  98.4 F (36.9 C)    TempSrc:  Oral    SpO2: 100%  99% 95%  Weight:      Height:        Intake/Output Summary (Last 24 hours) at 10/14/2023 1134 Last data filed at 10/14/2023 0709 Gross per 24 hour  Intake 2998.43 ml  Output 1535 ml  Net 1463.43 ml   Filed Weights   10/12/23 0200 10/13/23 0439 10/14/23 0441  Weight: 72.2 kg 75 kg 74 kg   Body mass index is 26.33 kg/m.  GENERAL:  Critically ill appearing. Opens eyes and responds HEENT:   Pupils equal round and reactive, fundi not visualized, oral mucosa unremarkable NECK:   Unable to asess jugular venous distention, waveform within normal limits, carotid upstroke brisk and symmetric, no bruits, no thyromegaly LUNGS:  Decreased breath sounds with diffuse wheezing.  BACK:  No CVA tenderness CHEST:   Unremarkable HEART:  PMI not displaced or sustained,S1 and S2 within normal limits, no S3, no S4, no clicks, no rubs, 3/6 apical systolic murmur, no diastolic murmurs ABD:  Flat, positive bowel sounds normal in  frequency in pitch, no bruits, no rebound, no guarding, no midline pulsatile mass, no hepatomegaly, no splenomegaly EXT:  2 plus pulses throughout, no  edema, no cyanosis no clubbing SKIN:  No rashes no nodules NEURO:  Intubated and sedated.   PSYCH:  Intubated and sedated.    EKG:  The EKG was personally reviewed and demonstrates:  ST, no acute ST T wave changes.  Telemetry:  Telemetry was personally reviewed and demonstrates:  ST  Relevant CV Studies: See below.    Laboratory Data:  Chemistry Recent Labs  Lab 10/12/23 0500 10/13/23 0545 10/14/23 0418  NA 134* 135 135  K 4.2 4.1 3.6  CL 101 104 105  CO2 24 24 24   GLUCOSE 182* 263* 225*  BUN 26* 34* 29*  CREATININE 0.89 0.79 0.70  CALCIUM 8.5* 8.7* 8.4*  GFRNONAA >60 >60 >60  ANIONGAP 9 7 6     Recent Labs  Lab 10/10/23 1011 10/11/23 0516  PROT 7.7 6.4*  ALBUMIN 2.4* 1.8*  AST 13* 12*  ALT 11 10  ALKPHOS 97 71  BILITOT 0.4 0.6   Hematology Recent Labs  Lab 10/12/23 0500 10/13/23 0545 10/14/23 0418  WBC 13.2* 13.2* 16.0*  RBC 2.47* 2.73* 2.64*  HGB 8.1* 9.0* 8.6*  HCT 26.9* 29.4* 28.5*  MCV 108.9* 107.7* 108.0*  MCH 32.8 33.0 32.6  MCHC 30.1 30.6 30.2  RDW 17.5* 17.5* 17.4*  PLT 444* 478* 493*   Cardiac EnzymesNo results for input(s): TROPONINI in the last 168 hours. No results for input(s): TROPIPOC in the last 168 hours.  BNP Recent Labs  Lab 10/10/23 1026  BNP 271.0*    DDimer No results for input(s): DDIMER in the last 168 hours.  Radiology/Studies:  MR BRAIN W CONTRAST Result Date: 10/14/2023 EXAM: MRI BRAIN WITH CONTRAST 10/14/2023 06:42:18 AM TECHNIQUE: Multiplanar multisequence MRI of the head/brain was performed with the administration of intravenous contrast. COMPARISON: Noncontrast MR head without contrast 10/13/2023 and CT angio head and neck 10/13/2023. CLINICAL HISTORY: Metastatic disease evaluation. Personal history of lung cancer. FINDINGS: BRAIN AND VENTRICLES: No acute  infarct. No acute intracranial hemorrhage. No mass effect or midline shift. No hydrocephalus. The sella is unremarkable. Normal flow voids. No mass or abnormal enhancement. No pathologic enhancement is present. Specifically, the foci of restricted diffusion demonstrated yesterday I do not enhance. ORBITS: No acute abnormality. SINUSES:  No acute abnormality. BONES AND SOFT TISSUES: Normal bone marrow signal and enhancement. No acute soft tissue abnormality. IMPRESSION: 1. No evidence of metastatic disease or other pathologic enhancement. Electronically signed by: Lonni Necessary MD 10/14/2023 06:50 AM EDT RP Workstation: [id number]   CT ANGIO HEAD NECK W WO CM Addendum Date: 10/13/2023 ADDENDUM REPORT: 10/13/2023 17:57 ADDENDUM: CT head impression #2 called by telephone on 10/13/2023 at 5:57 pm to provider DEVON Dr. Meade, Who verbally acknowledged these results. Electronically Signed   By: Rockey Childs D.O.   On: 10/13/2023 17:57   Result Date: 10/13/2023 CLINICAL DATA:  Provided history: Stroke/TIA, determine embolic source. EXAM: CT ANGIOGRAPHY HEAD AND NECK WITH AND WITHOUT CONTRAST TECHNIQUE: Multidetector CT imaging of the head and neck was performed using the standard protocol during bolus administration of intravenous contrast. Multiplanar CT image reconstructions and MIPs were obtained to evaluate the vascular anatomy. Carotid stenosis measurements (when applicable) are obtained utilizing NASCET criteria, using the distal internal carotid diameter as the denominator. RADIATION DOSE REDUCTION: This exam was performed according to the departmental dose-optimization program which includes automated exposure control, adjustment of the mA and/or kV according to patient size and/or use of iterative reconstruction technique. CONTRAST:  75mL OMNIPAQUE  IOHEXOL  350 MG/ML SOLN COMPARISON:  Brain MRI 10/13/2023. Head CT 10/10/2023. Chest CT 10/10/2023. FINDINGS: CT HEAD FINDINGS Brain: Generalized cerebral atrophy.  Known small acute infarcts within the bilateral cerebral hemispheres are occult by CT and were better appreciated on the brain MRI performed earlier today. A thin subdural collection along the right parietal lobe is also occult by CT and was better appreciated on the brain MRI performed earlier today (measuring 3 mm in thickness on the prior exam). Patchy and ill-defined hypoattenuation within the cerebral white matter, nonspecific but compatible with moderate chronic small vessel ischemic disease. There is no acute intracranial hemorrhage. No extra-axial fluid collection. No evidence of an intracranial mass. No midline shift. Vascular: No hyperdense vessel. Atherosclerotic calcifications. Skull: No calvarial fracture or aggressive osseous lesion. Sinuses/Orbits: No orbital mass or acute orbital finding. No significant paranasal sinus disease. Review of the MIP images confirms the above findings CTA NECK FINDINGS Aortic arch: Standard aortic branching. Atherosclerotic plaque within visible portions of the thoracic aorta and within proximal major branch vessels of the neck. No hemodynamically significant innominate or proximal subclavian artery stenosis. Right carotid system: CCA and ICA patent within the neck. Atherosclerotic plaque. Most notably, there is prominent predominantly calcified plaque within the proximal ICA resulting in a severe (estimated greater than 80%) stenosis. Left carotid system: CCA and ICA patent within the neck. Atherosclerotic plaque. Most notably, moderate predominately calcified plaque within the proximal ICA results in 30-40% stenosis. Vertebral arteries: Codominant and patent within the neck. Mild sclerotic narrowing at the left vertebral artery origin. Skeleton: The patient is edentulous. Spondylosis at the cervical and visualized upper thoracic levels. Other neck: No neck mass or cervical lymphadenopathy. Upper chest: The ET tube terminates within the distal trachea (just above the  carina). Partially visualized enteric tube. Otherwise, no significant interval change at the imaged levels as compared to the recent prior chest CT of 10/10/2023. The patient has a known history of lung cancer. Please refer to this prior examination for a complete description. Review of the MIP images confirms the above findings CTA HEAD FINDINGS Anterior circulation: The intracranial internal carotid arteries are patent. Atherosclerotic plaque bilaterally. Mostly, there is up to moderate stenosis of the right paraclinoid segment. The M1 middle cerebral arteries are patent. No  M2 proximal branch occlusion or high-grade proximal stenosis. The anterior cerebral arteries are patent. No intracranial aneurysm is identified. Posterior circulation: The intracranial vertebral arteries are patent. The basilar artery is patent. The posterior cerebral arteries are patent. A right posterior communicating artery is present. The left posterior communicating artery is diminutive or absent. Venous sinuses: Within the limitations of contrast timing, no convincing thrombus. Anatomic variants: As described. Review of the MIP images confirms the above findings Attempts are being made to reach the ordering provider regarding head CT impression #2 at this time. IMPRESSION: Non-contrast head CT: 1. Known small acute infarcts within the bilateral cerebral hemispheres are occult by CT and were better appreciated on the brain MRI performed earlier today. 2. A thin subdural collection (possible subdural hematoma) along the right parietal lobe is also occult by CT and was better appreciated on the brain MRI performed earlier today (measuring 3 mm in thickness on the prior exam). 3. Background parenchymal atrophy and chronic small vessel ischemic disease. CTA neck: 1. The common carotid and internal carotid arteries are patent within the neck. Atherosclerotic plaque bilaterally, most notably as follows. Severe (estimated greater than 80%)  stenosis of the proximal right ICA. 30-40% stenosis of the proximal left ICA. 2. Vertebral arteries patent within the neck. Mild atherosclerotic narrowing at the left vertebral artery origin. 3. Aortic Atherosclerosis (ICD10-I70.0). CTA head: 1. No proximal intracranial large vessel occlusion is identified. 2. Atherosclerotic plaque within the intracranial internal carotid arteries. Most notably, there is up to moderate stenosis of the paraclinoid right ICA. Electronically Signed: By: Rockey Childs D.O. On: 10/13/2023 17:47   MR BRAIN WO CONTRAST Result Date: 10/13/2023 CLINICAL DATA:  Altered mental status EXAM: MRI HEAD WITHOUT CONTRAST TECHNIQUE: Multiplanar, multiecho pulse sequences of the brain and surrounding structures were obtained without intravenous contrast. COMPARISON:  July 22, 2023 FINDINGS: Brain: There several punctate acute infarcts. There are 3 present in the right frontal lobe, 1 in the right occipital lobe, 1 in the posteromedial left frontal lobe and 1 in the left parietal lobe. There are multiple additional foci of T2 hyperintensity in the cerebral white matter both hemispheres without restriction of diffusion. The ventricles are normal. Vascular: There are normal flow signal is present in the carotid arteries and basilar artery. Skull and upper cervical spine: No significant abnormality Sinuses/Orbits: No significant abnormality IMPRESSION: There are several punctate acute infarcts involving both hemispheres, 3 in the right frontal lobe, 1 in the right occipital lobe, 1 in the posteromedial left frontal lobe and 1 in the left parietal lobe. These results will be called to the ordering clinician or representative by the Radiologist Assistant, and communication documented in the PACS or [employer]. Electronically Signed   By: Nancyann Burns M.D.   On: 10/13/2023 12:28   ECHOCARDIOGRAM COMPLETE Result Date: 10/13/2023    ECHOCARDIOGRAM REPORT   Patient Name:   JOELLE FLESSNER Date of  Exam: 10/13/2023 Medical Rec #:  [id number]        Height:       66.0 in Accession #:    [id number]       Weight:       165.3 lb Date of Birth:  [birth year]        BSA:          1.844 m Patient Age:    77 years         BP:           136/51 mmHg Patient Gender: M  HR:           78 bpm. Exam Location:  Inpatient Procedure: 2D Echo, Cardiac Doppler and Color Doppler (Both Spectral and Color            Flow Doppler were utilized during procedure). Indications:    Murmur R01.1  History:        Patient has prior history of Echocardiogram examinations, most                 recent 09/15/2021.  Sonographer:    Tinnie Gosling RDCS Referring Phys: [id number] TINNIE FORBES FURTH IMPRESSIONS  1. Left ventricular ejection fraction, by estimation, is 55 to 60%. The left ventricle has normal function. Left ventricular endocardial border not optimally defined to evaluate regional wall motion. Left ventricular diastolic function could not be evaluated.  2. Right ventricular systolic function is normal. The right ventricular size is mildly enlarged.  3. A small pericardial effusion is present. The pericardial effusion is surrounding the apex.  4. The mitral valve is degenerative. No evidence of mitral valve regurgitation. Moderate to severe mitral annular calcification.  5. This is a technically difficult study: DVI 0.25 and limited valve motion consistent with severe aortic stenosis with decreased stroke volume index; mean gradient 15 mm Hg. Off axis images. . The aortic valve was not well visualized. Aortic valve regurgitation is trivial. Moderate to severe aortic valve stenosis.  6. The inferior vena cava is dilated in size with <50% respiratory variability, suggesting right atrial pressure of 15 mmHg. Comparison(s): Prior images reviewed side by side. Marginal increase in aortic valve gradients, stroke volume index diminished in this study. There is some degree of progression of aortic stenosis; this study is technically difficult  compared to prior. FINDINGS  Left Ventricle: Left ventricular ejection fraction, by estimation, is 55 to 60%. The left ventricle has normal function. Left ventricular endocardial border not optimally defined to evaluate regional wall motion. The left ventricular internal cavity size was normal in size. There is no left ventricular hypertrophy. Left ventricular diastolic function could not be evaluated due to mitral annular calcification (moderate or greater). Left ventricular diastolic function could not be evaluated. Right Ventricle: The right ventricular size is mildly enlarged. No increase in right ventricular wall thickness. Right ventricular systolic function is normal. Left Atrium: Left atrial size was normal in size. Right Atrium: Right atrial size was normal in size. Pericardium: A small pericardial effusion is present. The pericardial effusion is surrounding the apex. Mitral Valve: The mitral valve is degenerative in appearance. Moderate to severe mitral annular calcification. No evidence of mitral valve regurgitation. Tricuspid Valve: The tricuspid valve is not well visualized. Tricuspid valve regurgitation is not demonstrated. Aortic Valve: This is a technically difficult study: DVI 0.25 and limited valve motion consistent with severe aortic stenosis with decreased stroke volume index; mean gradient 15 mm Hg. Off axis images. The aortic valve was not well visualized. Aortic valve regurgitation is trivial. Moderate to severe aortic stenosis is present. Aortic valve mean gradient measures 15.0 mmHg. Aortic valve peak gradient measures 26.8 mmHg. Aortic valve area, by VTI measures 0.77 cm. Pulmonic Valve: The pulmonic valve was not well visualized. Pulmonic valve regurgitation is not visualized. Aorta: The aortic root and ascending aorta are structurally normal, with no evidence of dilitation. Venous: The inferior vena cava is dilated in size with less than 50% respiratory variability, suggesting right  atrial pressure of 15 mmHg. IAS/Shunts: The interatrial septum was not well visualized.  LEFT VENTRICLE PLAX 2D  LVIDd:         4.40 cm   Diastology LVIDs:         3.10 cm   LV e' medial:    6.20 cm/s LV PW:         0.80 cm   LV E/e' medial:  20.3 LV IVS:        0.80 cm   LV e' lateral:   6.64 cm/s LVOT diam:     2.00 cm   LV E/e' lateral: 19.0 LV SV:         39 LV SV Index:   21 LVOT Area:     3.14 cm  RIGHT VENTRICLE            IVC RV S prime:     7.94 cm/s  IVC diam: 2.20 cm TAPSE (M-mode): 0.6 cm LEFT ATRIUM           Index        RIGHT ATRIUM           Index LA diam:      2.90 cm 1.57 cm/m   RA Area:     15.40 cm LA Vol (A4C): 51.5 ml 27.92 ml/m  RA Volume:   37.60 ml  20.39 ml/m  AORTIC VALVE AV Area (Vmax):    0.78 cm AV Area (Vmean):   0.65 cm AV Area (VTI):     0.77 cm AV Vmax:           259.00 cm/s AV Vmean:          183.000 cm/s AV VTI:            0.500 m AV Peak Grad:      26.8 mmHg AV Mean Grad:      15.0 mmHg LVOT Vmax:         64.60 cm/s LVOT Vmean:        38.100 cm/s LVOT VTI:          0.123 m LVOT/AV VTI ratio: 0.25  AORTA Ao Root diam: 2.30 cm MITRAL VALVE MV Area (PHT): 4.31 cm     SHUNTS MV E velocity: 126.00 cm/s  Systemic VTI:  0.12 m MV A velocity: 99.40 cm/s   Systemic Diam: 2.00 cm MV E/A ratio:  1.27 Stanly Leavens MD Electronically signed by Stanly Leavens MD Signature Date/Time: 10/13/2023/11:11:27 AM    Final    EEG adult Result Date: 10/12/2023 Shelton Arlin KIDD, MD     10/12/2023  2:39 PM Patient Name: SLATON REASER MRN: [id number] Epilepsy Attending: Arlin KIDD Shelton Referring Physician/Provider: Adolph Tinnie BRAVO, PA-C Date: 10/12/2023 Duration: 24.55 mins Patient history: 78yo M with ams. EEG to evaluate for seizure  Level of alertness: comatose  AEDs during EEG study: Propofol , GBP  Technical aspects: This EEG was obtained using a 10 lead EEG system positioned circumferentially without any parasagittal coverage (rapid EEG). Computer selected EEG is reviewed as  well  as background features and all clinically significant events.  Description: EEG showed continuous generalized polymorphic sharply contoured 3 to 7 Hz theta-delta slowing. Hyperventilation and photic stimulation were not performed.    ABNORMALITY - Continuous slow, generalized  IMPRESSION: This EEG is suggestive of moderate to severe diffuse encephalopathy.  No seizures or epileptiform discharges were seen throughout the recording. Arlin KIDD Shelton   DG Chest Port 1 View Result Date: 10/11/2023 CLINICAL DATA:  78 year old male with respiratory failure. Intubated. Recent hospitalization and sepsis. Lung cancer, extensive new right lower lung airway  and airspace opacification by CTA. EXAM: PORTABLE CHEST 1 VIEW COMPARISON:  Portable chest and CTA Chest yesterday and earlier. FINDINGS: Portable AP semi upright view at 0117 hours. Satisfactory ET tube. Enteric tube side hole is visible at the level of the gastric body. Paucity of bowel gas in the upper abdomen. Stable left chest power port, left chest cardiac loop recorder or superficial ICD. Stable cardiac size and mediastinal contours. Left lung apex, right suprahilar masslike opacities related to lung cancer and/or treatment as per CTA. Patchy and confluent ongoing medial right lung base opacity corresponding to lower lobe airway and airspace density. Trace right pleural effusion on CTA not evident. No areas of worsening ventilation. IMPRESSION: 1. Satisfactory ET tube and Enteric tube. 2. Ventilation stable from CTA chest yesterday with suspected medial right lung base Pneumonia, left apex and right suprahilar masslike treated areas. 3. No new cardiopulmonary abnormality identified. Electronically Signed   By: VEAR Hurst M.D.   On: 10/11/2023 05:15   DG Chest Portable 1 View Result Date: 10/10/2023 CLINICAL DATA:  OG tube advancement EXAM: PORTABLE CHEST 1 VIEW COMPARISON:  Radiograph 10/10/2023 FINDINGS: Minimal change in NG tube position. Side port remains at or  near the GE junction. Tip is within the stomach. IMPRESSION: No significant change in gastric tube position. Electronically Signed   By: Jackquline Boxer M.D.   On: 10/10/2023 16:49   DG Chest Port 1 View Result Date: 10/10/2023 CLINICAL DATA:  Post intubation and enteric tube placement. EXAM: PORTABLE CHEST 1 VIEW COMPARISON:  Radiographs 10/10/2023, 09/17/2023 and 03/17/2016. Chest CT 10/10/2023. FINDINGS: 1455 hours. Two views submitted. Interval intubation. Tip of the endotracheal tube overlies the mid trachea, approximately 2.8 cm above the carina. Enteric tube projects below the diaphragm with side hole near the gastroesophageal junction. Left-sided Port-A-Cath tip overlies the mid SVC. The heart size and mediastinal contours are stable with aortic atherosclerosis. There is improved aeration of both lungs. Chronic radiation changes are present in both upper lobes. No evidence of pleural effusion or pneumothorax. Loop recorder overlies the lower left chest. IMPRESSION: 1. Interval intubation and placement of enteric tube as described. Side hole of the enteric tube is near the GE junction; suggest advancement. 2. Improved aeration of both lungs. Chronic radiation changes in both upper lobes. Electronically Signed   By: Elsie Perone M.D.   On: 10/10/2023 15:20    Assessment and Plan:   Chronic HFpEF :   Echo preserved as above.   Was on Torsemide  20 mg and spiro 25 mg at home.  Currently held.  EF 55 - 6% as above.   No edema no CXR.  Holding Torsemide  and spiro.  Follow volume and diurese as needed.  CXR without edema.  BNP was minimally elevated.    History of typical atrial flutter s/p ablation 01/2022 :  Currently in NST with atrial tachy.   He had his implanted loop checked on 7/2 and had no atrial fib.  He had AT lasting as long as 44 hours.    Not a candidate for antiarrhythmic therapy.  He was on home dilt.  He has not been on DOAC but was on Plavix  for CAD.  Manage tachycardia with support of  underlying acute illness and IV metoprolol  as needed.   He would not need systemic heparin  for this arrhythmia.   Moderate to severe stenosis :    No obvious vegetations.  See below.    Sepsis:  No clear source.  Plan per critical care  Acute on chronic respiratory failure:  Exacerbation of COPD.  Recent pneumonia.  Lung cancer.       COPD exacerbation  Hypothyroidism:   Continue Synthroid .     Failure to thrive :  Previously on Megace .      CAD:  No evidence of active ischemia.  No further work up.  He is on Plavix .      CVA:    Seen by neurology and thought to have multifocal strokes.  No clear source.  Thought to be embolic.   However, no fib so no clear indication for systemic anticoagulation. The question of endocarditis has been raised.    There are no major criteria.  Minor criteria include valve disease as a predisposition.   I will discuss timing of TEE with my team.  He is critically ill with multiple comorbid illnesses.  Higher risk for TEE even with intubation/sedation.  Need to understand goals of therapy in this chronically ill frail patient.    For questions or updates, please contact [facility] Please consult [email or web address] for contact info under Cardiology/STEMI.   Signed, Lynwood Schilling, MD  10/14/2023 11:34 AM

## 2023-10-15 ENCOUNTER — Inpatient Hospital Stay (HOSPITAL_COMMUNITY)

## 2023-10-15 DIAGNOSIS — J441 Chronic obstructive pulmonary disease with (acute) exacerbation: Secondary | ICD-10-CM | POA: Diagnosis not present

## 2023-10-15 DIAGNOSIS — I4892 Unspecified atrial flutter: Secondary | ICD-10-CM | POA: Diagnosis not present

## 2023-10-15 DIAGNOSIS — I639 Cerebral infarction, unspecified: Secondary | ICD-10-CM | POA: Diagnosis not present

## 2023-10-15 DIAGNOSIS — I5032 Chronic diastolic (congestive) heart failure: Secondary | ICD-10-CM | POA: Diagnosis not present

## 2023-10-15 DIAGNOSIS — G9341 Metabolic encephalopathy: Secondary | ICD-10-CM | POA: Diagnosis not present

## 2023-10-15 DIAGNOSIS — J9621 Acute and chronic respiratory failure with hypoxia: Secondary | ICD-10-CM | POA: Diagnosis not present

## 2023-10-15 DIAGNOSIS — J9622 Acute and chronic respiratory failure with hypercapnia: Secondary | ICD-10-CM | POA: Diagnosis not present

## 2023-10-15 LAB — BASIC METABOLIC PANEL WITH GFR
Anion gap: 6 (ref 5–15)
BUN: 25 mg/dL — ABNORMAL HIGH (ref 8–23)
CO2: 23 mmol/L (ref 22–32)
Calcium: 8.5 mg/dL — ABNORMAL LOW (ref 8.9–10.3)
Chloride: 101 mmol/L (ref 98–111)
Creatinine, Ser: 0.71 mg/dL (ref 0.61–1.24)
GFR, Estimated: >60 mL/min (ref >60)
Glucose, Bld: 169 mg/dL — ABNORMAL HIGH (ref 70–99)
Potassium: 4.1 mmol/L (ref 3.5–5.1)
Sodium: 130 mmol/L — ABNORMAL LOW (ref 135–145)

## 2023-10-15 LAB — CBC
HCT: 30.9 % — ABNORMAL LOW (ref 39.0–52.0)
Hemoglobin: 9.4 g/dL — ABNORMAL LOW (ref 13.0–17.0)
MCH: 32.5 pg (ref 26.0–34.0)
MCHC: 30.4 g/dL (ref 30.0–36.0)
MCV: 106.9 fL — ABNORMAL HIGH (ref 80.0–100.0)
Platelets: 447 K/uL — ABNORMAL HIGH (ref 150–400)
RBC: 2.89 MIL/uL — ABNORMAL LOW (ref 4.22–5.81)
RDW: 17.6 % — ABNORMAL HIGH (ref 11.5–15.5)
WBC: 18.7 K/uL — ABNORMAL HIGH (ref 4.0–10.5)
nRBC: 0 % (ref 0.0–0.2)

## 2023-10-15 LAB — CULTURE, BLOOD (ROUTINE X 2)
Culture: NO GROWTH
Culture: NO GROWTH
Special Requests: ADEQUATE
Special Requests: ADEQUATE

## 2023-10-15 LAB — HEMOGLOBIN A1C
Hgb A1c MFr Bld: 7.1 % — ABNORMAL HIGH (ref 4.8–5.6)
Mean Plasma Glucose: 157 mg/dL

## 2023-10-15 LAB — GLUCOSE, CAPILLARY
Glucose-Capillary: 144 mg/dL — ABNORMAL HIGH (ref 70–99)
Glucose-Capillary: 171 mg/dL — ABNORMAL HIGH (ref 70–99)
Glucose-Capillary: 91 mg/dL (ref 70–99)
Glucose-Capillary: 166 mg/dL — ABNORMAL HIGH (ref 70–99)
Glucose-Capillary: 158 mg/dL — ABNORMAL HIGH (ref 70–99)

## 2023-10-15 LAB — PHOSPHORUS: Phosphorus: 2.1 mg/dL — ABNORMAL LOW (ref 2.5–4.6)

## 2023-10-15 LAB — TRIGLYCERIDES: Triglycerides: 308 mg/dL — ABNORMAL HIGH (ref <150)

## 2023-10-15 LAB — MAGNESIUM: Magnesium: 2.4 mg/dL (ref 1.7–2.4)

## 2023-10-15 MED ORDER — SPIRONOLACTONE 25 MG PO TABS
25.0000 mg | ORAL_TABLET | Freq: Two times a day (BID) | ORAL | Status: DC
  Administered 2023-10-15 – 2023-10-18 (×7): 25 mg
  Filled 2023-10-15 (×7): qty 1

## 2023-10-15 MED ORDER — FUROSEMIDE 10 MG/ML IJ SOLN
80.0000 mg | Freq: Once | INTRAMUSCULAR | Status: AC
  Administered 2023-10-15: 80 mg via INTRAVENOUS
  Filled 2023-10-15: qty 8

## 2023-10-15 MED ORDER — FUROSEMIDE 10 MG/ML IJ SOLN
40.0000 mg | Freq: Once | INTRAMUSCULAR | Status: AC
  Administered 2023-10-15: 40 mg via INTRAVENOUS
  Filled 2023-10-15: qty 4

## 2023-10-15 MED ORDER — VITAL AF 1.2 CAL PO LIQD
1000.0000 mL | ORAL | Status: DC
  Administered 2023-10-15: 1000 mL

## 2023-10-15 MED ORDER — METOLAZONE 2.5 MG PO TABS
2.5000 mg | ORAL_TABLET | ORAL | Status: DC
  Administered 2023-10-16: 2.5 mg
  Filled 2023-10-15: qty 1

## 2023-10-15 MED ORDER — ORAL CARE MOUTH RINSE
15.0000 mL | Freq: Three times a day (TID) | OROMUCOSAL | Status: DC
  Administered 2023-10-15: 15 mL via OROMUCOSAL

## 2023-10-15 MED ORDER — TORSEMIDE 10 MG PO TABS
10.0000 mg | ORAL_TABLET | Freq: Every day | ORAL | Status: DC
  Filled 2023-10-15: qty 1

## 2023-10-15 MED ORDER — ALTEPLASE 2 MG IJ SOLR
2.0000 mg | Freq: Once | INTRAMUSCULAR | Status: AC
  Administered 2023-10-15: 2 mg
  Filled 2023-10-15: qty 2

## 2023-10-15 MED ORDER — K PHOS MONO-SOD PHOS DI & MONO 155-852-130 MG PO TABS
500.0000 mg | ORAL_TABLET | ORAL | Status: AC
  Administered 2023-10-15 (×3): 500 mg
  Filled 2023-10-15 (×3): qty 2

## 2023-10-15 NOTE — Progress Notes (Signed)
 [facility] ADULT ICU REPLACEMENT PROTOCOL   The patient does apply for the [facility] Adult ICU Electrolyte Replacment Protocol based on the criteria listed below:   1.Exclusion criteria: TCTS, ECMO, Dialysis, and Myasthenia Gravis patients 2. Is GFR >/= 30 ml/min? Yes.    Patient's GFR today is >60 3. Is SCr </= 2? Yes.   Patient's SCr is 0.71 mg/dL 4. Did SCr increase >/= 0.5 in 24 hours? No. 5.Pt's weight >40kg  Yes.   6. Abnormal electrolyte(s): Phos 2.1  7. Electrolytes replaced per protocol 8.  Call MD STAT for K+ </= 2.5, Phos </= 1, or Mag </= 1 Physician:  Epimenio Ole BIRCH [facility] 10/15/2023 6:11 AM

## 2023-10-15 NOTE — Progress Notes (Signed)
 EEG reviewed, negative, ongoing goals of care discussion noted, appreciate cardiology confirmation of no underlying thrombogenic arrhythmias at this time. Stroke etiology remains cryptogenic, may not be a good candidate for TEE due to comorbidities. If concern for hypercoagulable state of malignancy remains, consider LE Duplex / CTA chest PE protocol to assess for other stigmata of hypercoag state, but defer this testing to primary team / oncology given overall clinical status.   Discussed with Dr. Meade via secure chat, inpatient neurology will sign off at this time but please do not hesitate to reach out with any questions/concerns  Lola Jernigan MD-PhD [facility] [phone] Available 7 AM to 7 PM, outside these hours please contact Neurologist on call listed on AMION   No charge note

## 2023-10-15 NOTE — Plan of Care (Incomplete)
  Problem: Coping: Goal: Ability to adjust to condition or change in health will improve Outcome: Progressing   Problem: Fluid Volume: Goal: Ability to maintain a balanced intake and output will improve Outcome: Progressing   Problem: Education: Goal: Knowledge of disease or condition will improve Outcome: Progressing   Problem: Nutrition: Goal: Risk of aspiration will decrease Outcome: Progressing

## 2023-10-15 NOTE — Progress Notes (Signed)
 Extubated Currently on [place]. Cough weak but improving He is oriented.  No sig wob currently. Still worry about stamina and think sig chance he could decline or simply not improve further.  Dagoberto and family are now at bedside, I have discussed w/ them that we would continue current care w/ BIPAP PRN and HS. Should he develop worsening WOB beyond what NIPPV can provide we would then need to transition to comfort. Family is in agreement w/ this plan.

## 2023-10-15 NOTE — Procedures (Signed)
 Extubation Procedure Note  Patient Details:   Name: John [name] DOB: 25-Jan-1946 MRN: [id number]   Airway Documentation:    Vent end date: 10/15/23 Vent end time: 1056   Evaluation  O2 sats: stable throughout Complications: No apparent complications Patient did tolerate procedure well. Bilateral Breath Sounds: Diminished, Rhonchi   Yes  Kearney Olam Caldron 10/15/2023, 10:56 AM

## 2023-10-15 NOTE — Progress Notes (Signed)
 OT Cancellation Note  Patient Details Name: John [name] MRN: [id number] DOB: 01/25/1946   Cancelled Treatment:    Reason Eval/Treat Not Completed: Medical issues which prohibited therapy. Plan to attempt weaning pt off vent today.   Delanna LITTIE Molt, OTR/L 10/15/2023, 4:15 PM

## 2023-10-15 NOTE — Progress Notes (Signed)
 NAME:  John [name], MRN:  [id number], DOB:  11-24-1945, LOS: 5 ADMISSION DATE:  10/10/2023, CONSULTATION DATE: 10/10/2023 REFERRING MD: Fairy Sermon, MD, CHIEF COMPLAINT: Acute respiratory failure  History of Present Illness:  78 year old with history of adenocarcinoma of the lung and squamous cell carcinoma, COPD, asthma, OSA chronic respiratory failure on home oxygen , a flutter, CHF.  Recently admitted to [facility] ER for sepsis, hypoxic respiratory failure secondary to pneumonia, AKI.  Patient wanted an early discharge and sent home on 7/4 but returned to the emergency room today with generalized weakness, fall overnight.  Maintained on BiPAP, noted to have slowly downtrending hemoglobin which is being monitored.  Was altered in the ED.  Given ceftriaxone , azithromycin , Vanco, Zosyn , LR and intubated.  Transferred to ICU for further management.  Patient patient has an history of stage III A adenocarcinoma diagnosed in 2018 treated with radiation, carboplatin , paclitaxel  and durvalumab .  Unfortunately he continued to smoke and PET scan in March 2025 showed recurrence in bilateral upper and lower lobes which was confirmed as stage IV squamous cell cancer stage by bronchoscopic biopsy in April 2025.  He got 1 round of chemotherapy but did not tolerate anymore and was placed on nivolumab  infusion on 7/1.  Pertinent  Medical History    has a past medical history of Adenocarcinoma of lung, right (HCC) (04/18/2016), Anxiety, Arthritis, Asthma, Atrial flutter (HCC), BPH (benign prostatic hyperplasia), CHF (congestive heart failure) (HCC), COPD (chronic obstructive pulmonary disease) (HCC), Depression, Diabetes mellitus, type II (HCC), GERD (gastroesophageal reflux disease), History of blood transfusion, History of kidney stones, History of radiation therapy, History of radiation therapy, Hyperlipidemia, Hypertension, Hypothyroidism, IDA (iron  deficiency anemia), Macular degeneration, Neuropathy, Non-small cell  lung cancer, right (HCC) (04/18/2016), Oxygen  dependent, PAD (peripheral artery disease) (HCC) (03/2023), Peripheral vascular disease (HCC), Prostatitis, Pulmonary nodule, left (07/16/2016), and Sleep apnea (2019).   Significant Hospital Events: Including procedures, antibiotic start and stop dates in addition to other pertinent events   7/1 - 10/09/2023 admitted to [facility] 7/5: transferred from [name], ED to [facility], intubated for acute hypoxic respiratory failure 7/6: off pressors  Had rhythmic motions of the legs while sedation is being weaned. EEG 7/7 negative. Ammonia negative. CT 7/8: EEG yesterday negative, but continues to be very altered with weaning attempts. Getting MRI  7/8 echo small pericardial effusion severe AS w/ decreased SV; IVC dilated w/ suggested RAP 15cmHg LVEF 55-60%; 7/9 repeat MRI w/ contrast was neg for acute findings. Neuro feels likely hypoxic injury driving myoclonic activity and delirium. EEG neg for seizure. Did have some leg and arm jerking witnessed felt non-epileptic but noted that myoclonus could have similar appearence  7/10 even more interactive, attempting weaning.  Interim History / Subjective:   More interactive today, but still gets tachycardic and restless once sedation starts to wear off indicating his throat hurts. Objective    Blood pressure (!) 131/44, pulse 75, temperature 98.6 F (37 C), temperature source Axillary, resp. rate 18, height 5' 6 (1.676 m), weight 74 kg, SpO2 99%.    Vent Mode: PRVC FiO2 (%):  [30 %] 30 % Set Rate:  [16 bmp] 16 bmp Vt Set:  [510 mL] 510 mL PEEP:  [5 cmH20] 5 cmH20 Plateau Pressure:  [10 cmH20-16 cmH20] 11 cmH20   Intake/Output Summary (Last 24 hours) at 10/15/2023 0755 Last data filed at 10/15/2023 9362 Gross per 24 hour  Intake 2477.77 ml  Output 1935 ml  Net 542.77 ml   [employer]  10/13/23 0439 10/14/23 0441 10/15/23 0500  Weight: 75 kg 74 kg 74 kg     Examination:.  General Chronically ill-appearing 78 year old male patient currently laying in bed he is not in acute distress but did become tachycardic during weaning attempt indicating that the tube is bothering his throat to the nursing staff HEENT normocephalic atraumatic orally intubated Pulmonary decreased breath sounds, improved aeration bilaterally, no wheezing today on exam Cardiac: Tachycardic rhythm, audible heart murmur consistent with known AS Abdomen soft nontender Flexi-Seal in place liquid stool noted GU clear yellow Neuro awake, follows commands, tracks today, no focal deficits, still will get intermittently agitated, still having intermittent episodes of myoclonus   Resolved problem list  Acute kidney injury   Assessment and Plan  Acute on chronic hypoxic, hypercarbic respiratory failure 2/2 COPD exacerbation, likely post-obstructive pneumonia, recurrent lung cancer  Sepsis, POA -felt not c/w  Nivolumab  induced pneumonitis as he received only 1 dose. -MRSA pcr +. Remains otherwise Cult neg to date  -still has leukocytosis, suspect exacerbated further by steroids -agitation has been a barrier  Plan  VAP bundle PAD protocol  Cont full vent support w/ daily assessment for SBT and PSV as tolerated  Day 3 azith, ctx and vanc; will stop vanc and azith at day 5. Complete 7d ceftriaxone   Cont steroids day 4 of 5  triple therapy wit yupelri , brovana , pulmicort   SBT today. Agitation big barrier to weaning yesterday. Hopefully we can find ideal window for extubation.  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 Cardiology evaluated and felt low risk of endocarditis and had not seen any arrhythmias  but he has had this remotely  plan holding systemic AC Cont delirium precautions  Minimize sedation seroquel  to 25 q hs cont gabapentin  frequent neuro checks  GOC discussion as above   Recurrent lung cancer (stage IV)  Unable to tolerate chemotherapy with no plans on resuming as was too weak to attempt second cycle.  Still on Opdivo   q 21 days and Yervoy  q42d (last dose 7/1) Plan OP follow up Not sure he will be candidate for further treatment given his extensive co-morbid list   Chronic diastolic heart failure, h/o aflutter and now w/ newly identified AS and h/o PAD s/p multiple procedures; most recently involving angioplasty and stenting of Right SFA and external iliac artery  Plan Holding outpatient meds including cardizem  and his propranolol  was as needed so cont to hold  Resume his  torsemide , metolazone , spironolactone  Restarted home plavix  7/8  Need to avoid tachycardia w/ his AS  Hypophosphatemia  Plan Replace and recheck in am   Anemia of chronic illness Declining hemoglobin noted at [facility].  No evidence of active bleed->stable for last 24hrs Plan trend cbc  transfuse <7   Hyperglycemia, steroid induced  - semglee  5 daily added 7/8; glycemic control still sub-optimal Plan Cont  resistant ssi scale  cbg q4h  Added TF coverage (hold where it is for now at 6 units q 4 as anticipate NPO status  Cont  semglee  to BID (5units)  HLD Plan  cont pravastatin  40mg  daily   Hypothyroid plan synthroid  50mcg daily   Decreased appetite, weight loss, failure to thrive Malnutrition (POA) -on vital 1.2 @ 73ml/hr Plan Cont tubefeeds   Best Practice (right click and Reselect all SmartList Selections daily)   Diet/type: tubefeeds and NPO DVT prophylaxis DOAC GI  prophylaxis: PPI Lines: N/A Foley:  Yes, and it is still needed Code Status:  full code Last date of multidisciplinary goals of care discussion []  Significant other Dagoberto updated over telephone 10/11/2023  Critical care time: 55 minutes

## 2023-10-15 NOTE — Progress Notes (Signed)
 Progress Note  Patient Name: John [name] Date of Encounter: 10/15/2023  Primary Cardiologist:   Darryle ONEIDA Decent, MD   Subjective   He is able to be aroused but sedated.  Denies pain  Inpatient Medications    Scheduled Meds:   stroke: early stages of recovery book   Does not apply Once   arformoterol   15 mcg Nebulization BID   budesonide  (PULMICORT ) nebulizer solution  0.25 mg Nebulization BID   Chlorhexidine  Gluconate Cloth  6 each Topical Q2200   clopidogrel   75 mg Per Tube Daily   docusate  100 mg Per Tube BID   famotidine   20 mg Per Tube BID   fentaNYL  (SUBLIMAZE ) injection  25-50 mcg Intravenous Once   gabapentin   300 mg Per Tube TID   insulin  aspart  0-20 Units Subcutaneous Q4H   insulin  aspart  6 Units Subcutaneous Q4H   insulin  glargine-yfgn  5 Units Subcutaneous BID   levothyroxine   50 mcg Per Tube Q0600   [START ON 10/16/2023] metolazone   2.5 mg Per Tube Once per day on Monday Wednesday Friday   mupirocin  ointment  1 Application Nasal BID   mouth rinse  15 mL Mouth Rinse Q2H   pantoprazole  (PROTONIX ) IV  40 mg Intravenous Daily   phosphorus  500 mg Per Tube Q4H   polyethylene glycol  17 g Per Tube Daily   pravastatin   40 mg Per Tube Once per day on Monday Wednesday Friday   predniSONE   40 mg Per Tube Q breakfast   QUEtiapine   25 mg Per Tube QHS   revefenacin   175 mcg Nebulization Daily   spironolactone   25 mg Per Tube BID   torsemide   10 mg Per Tube Daily   Continuous Infusions:  azithromycin  Stopped (10/14/23 1252)   cefTRIAXone  (ROCEPHIN )  IV Stopped (10/14/23 1004)   dexmedetomidine  (PRECEDEX ) IV infusion 0.5 mcg/kg/hr (10/15/23 0637)   feeding supplement (VITAL AF 1.2 CAL) 60 mL/hr at 10/15/23 0637   fentaNYL  infusion INTRAVENOUS 50 mcg/hr (10/15/23 0637)   vancomycin  Stopped (10/14/23 1848)   PRN Meds: docusate sodium , fentaNYL , hydrALAZINE , mouth rinse, polyethylene glycol, sodium chloride  flush   Vital Signs    Vitals:   10/15/23 0400  10/15/23 0500 10/15/23 0600 10/15/23 0732  BP: (!) 134/51 (!) 156/50 (!) 131/44   Pulse: 73 80 75   Resp: 17 18 18    Temp:    98.6 F (37 C)  TempSrc:    Axillary  SpO2: 100% 99% 99%   Weight:  74 kg    Height:        Intake/Output Summary (Last 24 hours) at 10/15/2023 1021 Last data filed at 10/15/2023 9362 Gross per 24 hour  Intake 2477.77 ml  Output 1935 ml  Net 542.77 ml   Filed Weights   10/13/23 0439 10/14/23 0441 10/15/23 0500  Weight: 75 kg 74 kg 74 kg    Telemetry    NSR, ST.   - Personally Reviewed  ECG    NA - Personally Reviewed  Physical Exam   GEN: No acute distress.   Neck:    Positive  JVD Cardiac: RRR, distant heart sounds, no rubs, or gallops.  Respiratory:     Decreased breath sounds, no crackles.  GI: Soft, nontender, non-distended  MS: No  edema; No deformity. Neuro:  Nonfocal  Psych:   Unable to assess.   Labs    Chemistry Recent Labs  Lab 10/10/23 1011 10/10/23 1955 10/11/23 0516 10/12/23 0500 10/13/23 9454  10/14/23 0418 10/15/23 0527  NA 137  --  135   < > 135 135 130*  K 3.5  --  4.6   < > 4.1 3.6 4.1  CL 99  --  101   < > 104 105 101  CO2 27  --  26   < > 24 24 23   GLUCOSE 161*  --  150*   < > 263* 225* 169*  BUN 29*  --  19   < > 34* 29* 25*  CREATININE 1.60*   < > 0.79   < > 0.79 0.70 0.71  CALCIUM 9.5  --  8.6*   < > 8.7* 8.4* 8.5*  PROT 7.7  --  6.4*  --   --   --   --   ALBUMIN 2.4*  --  1.8*  --   --   --   --   AST 13*  --  12*  --   --   --   --   ALT 11  --  10  --   --   --   --   ALKPHOS 97  --  71  --   --   --   --   BILITOT 0.4  --  0.6  --   --   --   --   GFRNONAA 44*   < > >60   < > >60 >60 >60  ANIONGAP 11  --  8   < > 7 6 6    < > = values in this interval not displayed.     Hematology Recent Labs  Lab 10/13/23 0545 10/14/23 0418 10/15/23 0527  WBC 13.2* 16.0* 18.7*  RBC 2.73* 2.64* 2.89*  HGB 9.0* 8.6* 9.4*  HCT 29.4* 28.5* 30.9*  MCV 107.7* 108.0* 106.9*  MCH 33.0 32.6 32.5  MCHC 30.6  30.2 30.4  RDW 17.5* 17.4* 17.6*  PLT 478* 493* 447*    Cardiac EnzymesNo results for input(s): TROPONINI in the last 168 hours. No results for input(s): TROPIPOC in the last 168 hours.   BNP Recent Labs  Lab 10/10/23 1026  BNP 271.0*     DDimer No results for input(s): DDIMER in the last 168 hours.   Radiology    DG Abd 1 View Result Date: 10/15/2023 CLINICAL DATA:  Enteric tube placement EXAM: ABDOMEN - 1 VIEW COMPARISON:  Abdominal radiograph dated 10/07/2022 FINDINGS: Post pyloric feeding tube tip projects over the distal duodenum. Additional enteric catheter tip projects over the stomach. Partially imaged central venous catheter tip projects over the lower SVC. Implanted loop recorder is seen projecting over the medial left hemithorax. Nonobstructive partially imaged bowel gas pattern. No free air or pneumatosis. IMPRESSION: 1. Post pyloric feeding tube tip projects over the distal duodenum. 2. Additional enteric catheter tip projects over the stomach. Electronically Signed   By: Limin  Xu M.D.   On: 10/15/2023 10:15   EEG adult Result Date: 10/14/2023 Shelton Arlin KIDD, MD     10/14/2023  4:57 PM Patient Name: John [name] MRN: [id number] Epilepsy Attending: Arlin KIDD Shelton Referring Physician/Provider: Jerrie Lola CROME, MD Date: 10/14/2023 Duration: 30.17 mins Patient history:  78yo M with ams. EEG to evaluate for seizure  Level of alertness: awake  AEDs during EEG study: GBP  Technical aspects: This EEG was obtained using a 10 lead EEG system positioned circumferentially without any parasagittal coverage (rapid EEG). Computer selected EEG is reviewed as  well as background features and all clinically  significant events.  Description: EEG showed continuous generalized 3 to 7 Hz theta-delta slowing, at times with triphasic morphology. Hyperventilation and photic stimulation were not performed.   Patient was noted to have brief leg jerk at 1615 and left arm jerking at 1618.  Concomitant eeg before, during and after the event didn't show eeg change to suggest seizure.  ABNORMALITY - Continuous slow, generalized  IMPRESSION: This EEG is suggestive of moderate diffuse encephalopathy.  No seizures or definite epileptiform discharges were seen throughout the recording. Patient was noted to have brief leg jerk at 1615 and left arm jerking at 1618 without concomitant eeg change. These events were most likely NON epileptic. Myoclonus can have similar appearance.  Arlin MALVA Krebs   MR BRAIN W CONTRAST Result Date: 10/14/2023 EXAM: MRI BRAIN WITH CONTRAST 10/14/2023 06:42:18 AM TECHNIQUE: Multiplanar multisequence MRI of the head/brain was performed with the administration of intravenous contrast. COMPARISON: Noncontrast MR head without contrast 10/13/2023 and CT angio head and neck 10/13/2023. CLINICAL HISTORY: Metastatic disease evaluation. Personal history of lung cancer. FINDINGS: BRAIN AND VENTRICLES: No acute infarct. No acute intracranial hemorrhage. No mass effect or midline shift. No hydrocephalus. The sella is unremarkable. Normal flow voids. No mass or abnormal enhancement. No pathologic enhancement is present. Specifically, the foci of restricted diffusion demonstrated yesterday I do not enhance. ORBITS: No acute abnormality. SINUSES: No acute abnormality. BONES AND SOFT TISSUES: Normal bone marrow signal and enhancement. No acute soft tissue abnormality. IMPRESSION: 1. No evidence of metastatic disease or other pathologic enhancement. Electronically signed by: Lonni Necessary MD 10/14/2023 06:50 AM EDT RP Workstation: [id number]   CT ANGIO HEAD NECK W WO CM Addendum Date: 10/13/2023 ADDENDUM REPORT: 10/13/2023 17:57 ADDENDUM: CT head impression #2 called by telephone on 10/13/2023 at 5:57 pm to provider DEVON Dr. Meade, Who verbally acknowledged these results. Electronically Signed   By: Rockey Childs D.O.   On: 10/13/2023 17:57   Result Date: 10/13/2023 CLINICAL DATA:   Provided history: Stroke/TIA, determine embolic source. EXAM: CT ANGIOGRAPHY HEAD AND NECK WITH AND WITHOUT CONTRAST TECHNIQUE: Multidetector CT imaging of the head and neck was performed using the standard protocol during bolus administration of intravenous contrast. Multiplanar CT image reconstructions and MIPs were obtained to evaluate the vascular anatomy. Carotid stenosis measurements (when applicable) are obtained utilizing NASCET criteria, using the distal internal carotid diameter as the denominator. RADIATION DOSE REDUCTION: This exam was performed according to the departmental dose-optimization program which includes automated exposure control, adjustment of the mA and/or kV according to patient size and/or use of iterative reconstruction technique. CONTRAST:  75mL OMNIPAQUE  IOHEXOL  350 MG/ML SOLN COMPARISON:  Brain MRI 10/13/2023. Head CT 10/10/2023. Chest CT 10/10/2023. FINDINGS: CT HEAD FINDINGS Brain: Generalized cerebral atrophy. Known small acute infarcts within the bilateral cerebral hemispheres are occult by CT and were better appreciated on the brain MRI performed earlier today. A thin subdural collection along the right parietal lobe is also occult by CT and was better appreciated on the brain MRI performed earlier today (measuring 3 mm in thickness on the prior exam). Patchy and ill-defined hypoattenuation within the cerebral white matter, nonspecific but compatible with moderate chronic small vessel ischemic disease. There is no acute intracranial hemorrhage. No extra-axial fluid collection. No evidence of an intracranial mass. No midline shift. Vascular: No hyperdense vessel. Atherosclerotic calcifications. Skull: No calvarial fracture or aggressive osseous lesion. Sinuses/Orbits: No orbital mass or acute orbital finding. No significant paranasal sinus disease. Review of the MIP images confirms the above findings CTA  NECK FINDINGS Aortic arch: Standard aortic branching. Atherosclerotic plaque  within visible portions of the thoracic aorta and within proximal major branch vessels of the neck. No hemodynamically significant innominate or proximal subclavian artery stenosis. Right carotid system: CCA and ICA patent within the neck. Atherosclerotic plaque. Most notably, there is prominent predominantly calcified plaque within the proximal ICA resulting in a severe (estimated greater than 80%) stenosis. Left carotid system: CCA and ICA patent within the neck. Atherosclerotic plaque. Most notably, moderate predominately calcified plaque within the proximal ICA results in 30-40% stenosis. Vertebral arteries: Codominant and patent within the neck. Mild sclerotic narrowing at the left vertebral artery origin. Skeleton: The patient is edentulous. Spondylosis at the cervical and visualized upper thoracic levels. Other neck: No neck mass or cervical lymphadenopathy. Upper chest: The ET tube terminates within the distal trachea (just above the carina). Partially visualized enteric tube. Otherwise, no significant interval change at the imaged levels as compared to the recent prior chest CT of 10/10/2023. The patient has a known history of lung cancer. Please refer to this prior examination for a complete description. Review of the MIP images confirms the above findings CTA HEAD FINDINGS Anterior circulation: The intracranial internal carotid arteries are patent. Atherosclerotic plaque bilaterally. Mostly, there is up to moderate stenosis of the right paraclinoid segment. The M1 middle cerebral arteries are patent. No M2 proximal branch occlusion or high-grade proximal stenosis. The anterior cerebral arteries are patent. No intracranial aneurysm is identified. Posterior circulation: The intracranial vertebral arteries are patent. The basilar artery is patent. The posterior cerebral arteries are patent. A right posterior communicating artery is present. The left posterior communicating artery is diminutive or absent.  Venous sinuses: Within the limitations of contrast timing, no convincing thrombus. Anatomic variants: As described. Review of the MIP images confirms the above findings Attempts are being made to reach the ordering provider regarding head CT impression #2 at this time. IMPRESSION: Non-contrast head CT: 1. Known small acute infarcts within the bilateral cerebral hemispheres are occult by CT and were better appreciated on the brain MRI performed earlier today. 2. A thin subdural collection (possible subdural hematoma) along the right parietal lobe is also occult by CT and was better appreciated on the brain MRI performed earlier today (measuring 3 mm in thickness on the prior exam). 3. Background parenchymal atrophy and chronic small vessel ischemic disease. CTA neck: 1. The common carotid and internal carotid arteries are patent within the neck. Atherosclerotic plaque bilaterally, most notably as follows. Severe (estimated greater than 80%) stenosis of the proximal right ICA. 30-40% stenosis of the proximal left ICA. 2. Vertebral arteries patent within the neck. Mild atherosclerotic narrowing at the left vertebral artery origin. 3. Aortic Atherosclerosis (ICD10-I70.0). CTA head: 1. No proximal intracranial large vessel occlusion is identified. 2. Atherosclerotic plaque within the intracranial internal carotid arteries. Most notably, there is up to moderate stenosis of the paraclinoid right ICA. Electronically Signed: By: Rockey Childs D.O. On: 10/13/2023 17:47   MR BRAIN WO CONTRAST Result Date: 10/13/2023 CLINICAL DATA:  Altered mental status EXAM: MRI HEAD WITHOUT CONTRAST TECHNIQUE: Multiplanar, multiecho pulse sequences of the brain and surrounding structures were obtained without intravenous contrast. COMPARISON:  July 22, 2023 FINDINGS: Brain: There several punctate acute infarcts. There are 3 present in the right frontal lobe, 1 in the right occipital lobe, 1 in the posteromedial left frontal lobe and 1 in  the left parietal lobe. There are multiple additional foci of T2 hyperintensity in the cerebral white matter  both hemispheres without restriction of diffusion. The ventricles are normal. Vascular: There are normal flow signal is present in the carotid arteries and basilar artery. Skull and upper cervical spine: No significant abnormality Sinuses/Orbits: No significant abnormality IMPRESSION: There are several punctate acute infarcts involving both hemispheres, 3 in the right frontal lobe, 1 in the right occipital lobe, 1 in the posteromedial left frontal lobe and 1 in the left parietal lobe. These results will be called to the ordering clinician or representative by the Radiologist Assistant, and communication documented in the PACS or [employer]. Electronically Signed   By: Nancyann Burns M.D.   On: 10/13/2023 12:28   ECHOCARDIOGRAM COMPLETE Result Date: 10/13/2023    ECHOCARDIOGRAM REPORT   Patient Name:   John [name] Date of Exam: 10/13/2023 Medical Rec #:  [id number]        Height:       66.0 in Accession #:    [id number]       Weight:       165.3 lb Date of Birth:  1945/08/25        BSA:          1.844 m Patient Age:    77 years         BP:           136/51 mmHg Patient Gender: M                HR:           78 bpm. Exam Location:  Inpatient Procedure: 2D Echo, Cardiac Doppler and Color Doppler (Both Spectral and Color            Flow Doppler were utilized during procedure). Indications:    Murmur R01.1  History:        Patient has prior history of Echocardiogram examinations, most                 recent 09/15/2021.  Sonographer:    Tinnie Gosling RDCS Referring Phys: [id number] TINNIE FORBES FURTH IMPRESSIONS  1. Left ventricular ejection fraction, by estimation, is 55 to 60%. The left ventricle has normal function. Left ventricular endocardial border not optimally defined to evaluate regional wall motion. Left ventricular diastolic function could not be evaluated.  2. Right ventricular systolic function is  normal. The right ventricular size is mildly enlarged.  3. A small pericardial effusion is present. The pericardial effusion is surrounding the apex.  4. The mitral valve is degenerative. No evidence of mitral valve regurgitation. Moderate to severe mitral annular calcification.  5. This is a technically difficult study: DVI 0.25 and limited valve motion consistent with severe aortic stenosis with decreased stroke volume index; mean gradient 15 mm Hg. Off axis images. . The aortic valve was not well visualized. Aortic valve regurgitation is trivial. Moderate to severe aortic valve stenosis.  6. The inferior vena cava is dilated in size with <50% respiratory variability, suggesting right atrial pressure of 15 mmHg. Comparison(s): Prior images reviewed side by side. Marginal increase in aortic valve gradients, stroke volume index diminished in this study. There is some degree of progression of aortic stenosis; this study is technically difficult compared to prior. FINDINGS  Left Ventricle: Left ventricular ejection fraction, by estimation, is 55 to 60%. The left ventricle has normal function. Left ventricular endocardial border not optimally defined to evaluate regional wall motion. The left ventricular internal cavity size was normal in size. There is no left ventricular hypertrophy. Left ventricular diastolic function could  not be evaluated due to mitral annular calcification (moderate or greater). Left ventricular diastolic function could not be evaluated. Right Ventricle: The right ventricular size is mildly enlarged. No increase in right ventricular wall thickness. Right ventricular systolic function is normal. Left Atrium: Left atrial size was normal in size. Right Atrium: Right atrial size was normal in size. Pericardium: A small pericardial effusion is present. The pericardial effusion is surrounding the apex. Mitral Valve: The mitral valve is degenerative in appearance. Moderate to severe mitral annular  calcification. No evidence of mitral valve regurgitation. Tricuspid Valve: The tricuspid valve is not well visualized. Tricuspid valve regurgitation is not demonstrated. Aortic Valve: This is a technically difficult study: DVI 0.25 and limited valve motion consistent with severe aortic stenosis with decreased stroke volume index; mean gradient 15 mm Hg. Off axis images. The aortic valve was not well visualized. Aortic valve regurgitation is trivial. Moderate to severe aortic stenosis is present. Aortic valve mean gradient measures 15.0 mmHg. Aortic valve peak gradient measures 26.8 mmHg. Aortic valve area, by VTI measures 0.77 cm. Pulmonic Valve: The pulmonic valve was not well visualized. Pulmonic valve regurgitation is not visualized. Aorta: The aortic root and ascending aorta are structurally normal, with no evidence of dilitation. Venous: The inferior vena cava is dilated in size with less than 50% respiratory variability, suggesting right atrial pressure of 15 mmHg. IAS/Shunts: The interatrial septum was not well visualized.  LEFT VENTRICLE PLAX 2D LVIDd:         4.40 cm   Diastology LVIDs:         3.10 cm   LV e' medial:    6.20 cm/s LV PW:         0.80 cm   LV E/e' medial:  20.3 LV IVS:        0.80 cm   LV e' lateral:   6.64 cm/s LVOT diam:     2.00 cm   LV E/e' lateral: 19.0 LV SV:         39 LV SV Index:   21 LVOT Area:     3.14 cm  RIGHT VENTRICLE            IVC RV S prime:     7.94 cm/s  IVC diam: 2.20 cm TAPSE (M-mode): 0.6 cm LEFT ATRIUM           Index        RIGHT ATRIUM           Index LA diam:      2.90 cm 1.57 cm/m   RA Area:     15.40 cm LA Vol (A4C): 51.5 ml 27.92 ml/m  RA Volume:   37.60 ml  20.39 ml/m  AORTIC VALVE AV Area (Vmax):    0.78 cm AV Area (Vmean):   0.65 cm AV Area (VTI):     0.77 cm AV Vmax:           259.00 cm/s AV Vmean:          183.000 cm/s AV VTI:            0.500 m AV Peak Grad:      26.8 mmHg AV Mean Grad:      15.0 mmHg LVOT Vmax:         64.60 cm/s LVOT Vmean:         38.100 cm/s LVOT VTI:          0.123 m LVOT/AV VTI ratio: 0.25  AORTA Ao Root diam:  2.30 cm MITRAL VALVE MV Area (PHT): 4.31 cm     SHUNTS MV E velocity: 126.00 cm/s  Systemic VTI:  0.12 m MV A velocity: 99.40 cm/s   Systemic Diam: 2.00 cm MV E/A ratio:  1.27 Stanly Leavens MD Electronically signed by Stanly Leavens MD Signature Date/Time: 10/13/2023/11:11:27 AM    Final     Cardiac Studies   See above  Patient Profile     78 y.o. male  with a hx of typical atrial flutter s/p ablation 01/2022, chronic anemia, chronic HFpEF, HTN, HLD, advanced COPD on chronic oxygen , PAD s/p right femoral  endarterectomy on 08/2020 and right common iliac artery stent on 08/2020, non-small cell lung CA s/p chemoradiation therapy in 2018,  who is being seen 10/14/2023 for the evaluation of history of A. Flutter with newly discovered AS at the request of Dr. Meade.   Assessment & Plan    Chronic HFpEF :   6 liters positive this admission.    I will give a dose of IV Lasix  given this and his history of diastolic HF.   Can resume PO in the AM.  Restarted Zaroxolyn  and spironolactone .      History of typical atrial flutter s/p ablation 01/2022 :    Loop has not demonstrated fib/flutter at last interrogation.  Tele with sinus and sinus tach  .  No plan for systemic anticoagulation given the absence of recent fib and his frailty and high risk for bleed   Moderate to severe stenosis :    No obvious vegetations.  Conservative therapy.    Sepsis:  No clear source.  Plan per critical care.  On antibiotics and supportive care.     Acute on chronic respiratory failure:  Exacerbation of COPD.  Recent pneumonia.  Lung cancer.        COPD exacerbation:  Plan extubation later today.    Hypothyroidism:   Continue Synthroid .      Failure to thrive :  Previously on Megace .       CAD:  No evidence of active ischemia.  No further work up.  He is on Plavix .      CVA:   Possible embolic without clear source.  No plan  for TEE as this would be high risk and need to be done at [facility] even if he is intubated.  I discussed this with critical care.  Plan to proceed with extubation.  Continue supportive care.  Goals of therapy discussed.    For questions or updates, please contact [facility] Please consult [email or web address] for contact info under Cardiology/STEMI.   Signed, Lynwood Schilling, MD  10/15/2023, 10:21 AM

## 2023-10-15 NOTE — Progress Notes (Signed)
 Rt placed pt on BIPAP for rest. No distress noted at this time.

## 2023-10-15 NOTE — Progress Notes (Signed)
 Volumes good F/VT acceptable He appears comfortable and not agitated on dex.  Follows commands Cortrack placed  Plan Extubate to bipap

## 2023-10-15 NOTE — Progress Notes (Signed)
 Nutrition Follow-up  DOCUMENTATION CODES:   Non-severe (moderate) malnutrition in context of chronic illness  INTERVENTION:  - Plan for trickle tube feeds only via Cortrak (tip at pylorus) while patient requiring BiPAP: Vital 1.2 @ 86mL/hr  - If able to advance past trickles, recommend: Vital 1.5 at 50 ml/h (1200 ml per day) *Would recommend increasaing Prosource TF20 60 ml daily Provides 1880 kcal, 101 gm protein, 917 ml free water  daily  - FWF per CCM.   - Monitor weight trends.  NUTRITION DIAGNOSIS:   Moderate Malnutrition related to chronic illness as evidenced by mild fat depletion, moderate muscle depletion. *ongoing  GOAL:   Patient will meet greater than or equal to 90% of their needs *not met but progressing, on TF  MONITOR:   Vent status, Labs, Weight trends, TF tolerance  REASON FOR ASSESSMENT:   Consult Enteral/tube feeding initiation and management  ASSESSMENT:   78 year old with PMH of adenocarcinoma of the lung and squamous cell carcinoma, COPD, asthma, OSA chronic respiratory failure, CHF who was recently admitted for sepsis, hypoxic respiratory failure secondary to pneumonia, AKI and was discharged 7/4 but returned 7/5 with generalized weakness, fall overnight.  7/5 Admit; Intubated  7/6 Vital HP initiated 7/7 Changed to Vital 1.2 7/10 Extubated, Cortrak placed  Patient extubated earlier today. Cortrak placed prior to intubation and was initially shown to be in the distal duodenum but tube came out some during extubation and now at the pylorus.   TF infusing at 60mL/hr at time of visit, patient off BiPAP and per RN tolerating fairly well.   Discussed with CCM NP, will plan to only do trickle TF's at this time as patient will likely require BiPAP overnight and Cortrak not completely post-pyloric. Will continue Vital formula as patient with notable diarrhea. Discussed plan with RN.   Of note, patient now DNR/DNI. If patient worsens, plan for  transition to comfort.     Medications reviewed and include: Colace, Miralax  Precedex    Labs reviewed:  Na 130 Phosphorus 2.1 HA1C 7.9 Blood Glucose 91-239 x24 hours  Diet Order:   Diet Order             Diet NPO time specified  Diet effective now                   EDUCATION NEEDS:  Not appropriate for education at this time  Skin:  Skin Assessment: Reviewed RN Assessment  Last BM:  7/10 - type 7  Height:  Ht Readings from Last 1 Encounters:  10/12/23 5' 6 (1.676 m)   Weight:  Wt Readings from Last 1 Encounters:  10/15/23 74 kg   Ideal Body Weight:  64.55 kg  BMI:  Body mass index is 26.33 kg/m.  Estimated Nutritional Needs:  Kcal:  1750-1950 kcals Protein:  90-105 grams Fluid:  >/= 1.8L    Trude Ned RD, LDN Contact via Secure Chat.

## 2023-10-15 NOTE — IPAL (Signed)
  Interdisciplinary Goals of Care Family Meeting   Date carried out: 10/15/2023  Location of the meeting: Phone conference  Member's involved: Nurse Practitioner and Family Member or next of kin Dagoberto Irving (patients significant other)  Durable Power of Attorney or acting medical decision maker: Dagoberto Irving    Discussion: We discussed goals of care for [employer] .  Specifically the following: his cancer, the impact this current illness could have on further eligibility for immunotherapy, the stroke, concern about hypoxic brain injury and uncertainty about how much he will recover and to what degree if any level of handicap will he have which remains unknown. She recognizes that his cancer is progressive, she is hopeful he will be able to return home. Understands that should he fail extubation OR end up on life support again given the degree of his deconditioning and multiple comorbidity's the chances of functional recovery are poor. She did not think prolonged ventilation, trach or care which would prevent him from living his current QOL would fall within his values. Because of this I have recommended DNR/DNI in future. Specifically should he become acutely ill in the future he could come to the hospital, we would offer him IV fluids, antibiotics, non-invasive ventilations, steroids if needed, nutritional support (short term if needed) but should these fail we would NOT offer mechanical ventilation and he would not undergo CPR defib or any advanced cardiac life support interventions. Based on our discussion Dagoberto is in agreement with this plan. I have also offered to contact palliative care as I think he will be a hospice candidate going further. Dagoberto is also in agreement for this should we be successful getting him off vent     Code status: DNR/DNI, no defib or advanced ACLS intervention Short term bipap, IVFs antibiotics ok.  Disposition: Continue current acute care Anticipate palliative care  consult if we do get him out of ICU w/ [facility] for home hospice    Time spent for the meeting: 32 min     Jeralyn FORBES Banner, NP  10/15/2023, 9:51 AM

## 2023-10-15 NOTE — Evaluation (Signed)
 SLP Cancellation Note  Patient Details Name: John [name] MRN: [id number] DOB: [birth year]   Cancelled treatment:       Reason Eval/Treat Not Completed: Other (comment) (pt extubated today and having some respiratory issues - requiring Bipap, will continue efforts)  Madelin POUR, MS [facility] SLP Acute Rehab Services Office [phone]  Nicolas Emmie Caldron 10/15/2023, 7:22 PM

## 2023-10-15 NOTE — Plan of Care (Signed)
  Problem: Coping: Goal: Ability to adjust to condition or change in health will improve Outcome: Progressing   Problem: Fluid Volume: Goal: Ability to maintain a balanced intake and output will improve Outcome: Progressing   Problem: Education: Goal: Knowledge of disease or condition will improve Outcome: Progressing   Problem: Nutrition: Goal: Risk of aspiration will decrease Outcome: Progressing

## 2023-10-15 NOTE — Progress Notes (Signed)
 Weaker over course of day  Getting CXR Worried he is fatiguing  Back on BIPAP  If has mucous plugging we can try non-invasive measures but should he fail we will be looking at transition to comfort care

## 2023-10-15 NOTE — Progress Notes (Signed)
 Rt NT's thick, white secretions from pt. Pt had no complications to procedure.

## 2023-10-15 NOTE — Progress Notes (Signed)
 Cxr reviewed Looks like increased edema  Plan Repeat lasix  (80mg  this time)

## 2023-10-15 NOTE — Progress Notes (Signed)
 PT Cancellation Note  Patient Details Name: John [name] MRN: [id number] DOB: 02-02-1946   Cancelled Treatment:    Reason Eval/Treat Not Completed: Medical issues which prohibited therapy  Being extubated today. Will check back tomorrow. Darice Potters PT Acute Rehabilitation Services Office [phone]  [name] 10/15/2023, 12:56 PM

## 2023-10-15 NOTE — Progress Notes (Signed)
 Bipap on hold due to pt having NG placed.

## 2023-10-16 DIAGNOSIS — E440 Moderate protein-calorie malnutrition: Secondary | ICD-10-CM | POA: Diagnosis not present

## 2023-10-16 DIAGNOSIS — I5032 Chronic diastolic (congestive) heart failure: Secondary | ICD-10-CM | POA: Diagnosis not present

## 2023-10-16 DIAGNOSIS — I350 Nonrheumatic aortic (valve) stenosis: Secondary | ICD-10-CM

## 2023-10-16 DIAGNOSIS — J9601 Acute respiratory failure with hypoxia: Secondary | ICD-10-CM | POA: Diagnosis not present

## 2023-10-16 DIAGNOSIS — A419 Sepsis, unspecified organism: Secondary | ICD-10-CM | POA: Diagnosis not present

## 2023-10-16 DIAGNOSIS — I4892 Unspecified atrial flutter: Secondary | ICD-10-CM | POA: Diagnosis not present

## 2023-10-16 DIAGNOSIS — J9621 Acute and chronic respiratory failure with hypoxia: Secondary | ICD-10-CM | POA: Diagnosis not present

## 2023-10-16 LAB — GLUCOSE, CAPILLARY
Glucose-Capillary: 104 mg/dL — ABNORMAL HIGH (ref 70–99)
Glucose-Capillary: 125 mg/dL — ABNORMAL HIGH (ref 70–99)
Glucose-Capillary: 144 mg/dL — ABNORMAL HIGH (ref 70–99)
Glucose-Capillary: 159 mg/dL — ABNORMAL HIGH (ref 70–99)
Glucose-Capillary: 200 mg/dL — ABNORMAL HIGH (ref 70–99)
Glucose-Capillary: 92 mg/dL (ref 70–99)
Glucose-Capillary: 99 mg/dL (ref 70–99)

## 2023-10-16 LAB — CBC
HCT: 31.7 % — ABNORMAL LOW (ref 39.0–52.0)
Hemoglobin: 9.7 g/dL — ABNORMAL LOW (ref 13.0–17.0)
MCH: 32.3 pg (ref 26.0–34.0)
MCHC: 30.6 g/dL (ref 30.0–36.0)
MCV: 105.7 fL — ABNORMAL HIGH (ref 80.0–100.0)
Platelets: 567 K/uL — ABNORMAL HIGH (ref 150–400)
RBC: 3 MIL/uL — ABNORMAL LOW (ref 4.22–5.81)
RDW: 17.5 % — ABNORMAL HIGH (ref 11.5–15.5)
WBC: 24.2 K/uL — ABNORMAL HIGH (ref 4.0–10.5)
nRBC: 0.1 % (ref 0.0–0.2)

## 2023-10-16 LAB — BASIC METABOLIC PANEL WITH GFR
Anion gap: 9 (ref 5–15)
BUN: 25 mg/dL — ABNORMAL HIGH (ref 8–23)
CO2: 28 mmol/L (ref 22–32)
Calcium: 8.7 mg/dL — ABNORMAL LOW (ref 8.9–10.3)
Chloride: 100 mmol/L (ref 98–111)
Creatinine, Ser: 0.64 mg/dL (ref 0.61–1.24)
GFR, Estimated: >60 mL/min (ref >60)
Glucose, Bld: 106 mg/dL — ABNORMAL HIGH (ref 70–99)
Potassium: 3.2 mmol/L — ABNORMAL LOW (ref 3.5–5.1)
Sodium: 137 mmol/L (ref 135–145)

## 2023-10-16 LAB — PHOSPHORUS: Phosphorus: 3 mg/dL (ref 2.5–4.6)

## 2023-10-16 LAB — MAGNESIUM: Magnesium: 2.2 mg/dL (ref 1.7–2.4)

## 2023-10-16 MED ORDER — HYDRALAZINE HCL 20 MG/ML IJ SOLN
5.0000 mg | Freq: Four times a day (QID) | INTRAMUSCULAR | Status: DC | PRN
  Administered 2023-10-16: 5 mg via INTRAVENOUS
  Filled 2023-10-16: qty 1

## 2023-10-16 MED ORDER — PROSOURCE TF20 ENFIT COMPATIBL EN LIQD
60.0000 mL | Freq: Two times a day (BID) | ENTERAL | Status: DC
Start: 2023-10-16 — End: 2023-10-18
  Administered 2023-10-16 – 2023-10-18 (×4): 60 mL
  Filled 2023-10-16 (×4): qty 60

## 2023-10-16 MED ORDER — ACETAMINOPHEN 325 MG PO TABS
650.0000 mg | ORAL_TABLET | ORAL | Status: DC | PRN
  Administered 2023-10-16: 650 mg via ORAL
  Filled 2023-10-16: qty 2

## 2023-10-16 MED ORDER — ORAL CARE MOUTH RINSE
15.0000 mL | OROMUCOSAL | Status: DC
  Administered 2023-10-16 – 2023-10-17 (×6): 15 mL via OROMUCOSAL

## 2023-10-16 MED ORDER — VITAL 1.5 CAL PO LIQD
1000.0000 mL | ORAL | Status: DC
  Filled 2023-10-16 (×3): qty 1000

## 2023-10-16 MED ORDER — ORAL CARE MOUTH RINSE: 15.0000 mL | OROMUCOSAL | Status: DC | PRN

## 2023-10-16 MED ORDER — POTASSIUM CHLORIDE 20 MEQ PO PACK
40.0000 meq | PACK | ORAL | Status: AC
  Administered 2023-10-16 (×2): 40 meq
  Filled 2023-10-16 (×2): qty 2

## 2023-10-16 NOTE — Evaluation (Signed)
 Occupational Therapy Evaluation Patient Details Name: John [name] MRN: [id number] DOB: 1945-05-14 Today's Date: 10/16/2023   History of Present Illness   78 year old recently admitted to the hospital with for sepsis, hypoxic respiratory failure secondary to pneumonia, AKI. Patient wanted an early discharge and sent home on 7/4 but returned to the emergency room 10/10/23 with generalized weakness, fall overnight. He was intubated 7/5-7/10, and of the brain revealed MRI revealed small acute  punctate infarct. Pt also found to have sepsis, recurrent lung CA, and PNA.  PMH: adenocarcinoma of the lung and squamous cell carcinoma, COPD, asthma, OSA chronic respiratory failure on home oxygen , a flutter, CHF, neuropathy, Sleep apnea     Clinical Impressions The pt is currently presenting significantly below his baseline level of functioning for self-care management, as he is limited by the below listed deficits (see OT problem list). During the session, he required max assist x2 for supine to sit, total assist for simulated lower body dressing, and max assist x2 to stand and take lateral steps along the EOB. He was further noted to be with general deconditioning, generalized weakness, and decreased endurance. His heart rate increased to 122 bpm with activity. He will benefit from further OT services to maximize his safety and independence with ADLs. Patient will benefit from continued inpatient follow up therapy, <3 hours/day.      If plan is discharge home, recommend the following:   A lot of help with walking and/or transfers;A lot of help with bathing/dressing/bathroom;Help with stairs or ramp for entrance;Assistance with cooking/housework;Assist for transportation     Functional Status Assessment   Patient has had a recent decline in their functional status and demonstrates the ability to make significant improvements in function in a reasonable and predictable amount of time.      Equipment Recommendations   Other (comment) (defer to next level of care)     Recommendations for Other Services         Precautions/Restrictions   Precautions Precautions: Fall Restrictions Weight Bearing Restrictions Per Provider Order: No     Mobility Bed Mobility Overal bed mobility: Needs Assistance Bed Mobility: Rolling, Sidelying to Sit, Sit to Supine Rolling: Mod assist Sidelying to sit: Max assist, HOB elevated, +2 for safety/equipment, +2 for physical assistance   Sit to supine: +2 for safety/equipment, +2 for physical assistance, Max assist   General bed mobility comments: multimodal cues to roll  and push up to sitting.  max support to  reuturn to supine, +2    Transfers Overall transfer level: Needs assistance   Transfers: Sit to/from Stand Sit to Stand: Mod assist, +2 physical assistance, +2 safety/equipment           General transfer comment: stood holding onto back ot a chair, max support to power up, cues to tuck in buttocks, he required assist to take small side steps along bed       Balance Overall balance assessment: Needs assistance, History of Falls   Sitting balance-Leahy Scale: Poor Sitting balance - Comments: teeters to each side and posterior     Standing balance-Leahy Scale: Poor Standing balance comment:  (mod to max assist for standing balance)             ADL either performed or assessed with clinical judgement   ADL Overall ADL's : Needs assistance/impaired Eating/Feeding: Bed level;Set up Eating/Feeding Details (indicate cue type and reason): based on clinical judgement Grooming: Minimal assistance;Bed level Grooming Details (indicate cue type and reason): simulated  Upper Body Dressing : Moderate assistance;Sitting   Lower Body Dressing: Total assistance;Sitting/lateral leans       Toileting- Clothing Manipulation and Hygiene: Maximal assistance;Total assistance Toileting - Clothing Manipulation  Details (indicate cue type and reason): at bedside commode level, based on clinical judgement              Pertinent Vitals/Pain Pain Assessment Pain Assessment: No/denies pain     Extremity/Trunk Assessment Upper Extremity Assessment Upper Extremity Assessment: RUE deficits/detail;Generalized weakness;Right hand dominant;LUE deficits/detail RUE Deficits / Details: Decreased AROM for shoulder flexion with AROM for shoulder flexion being <1/2 normal AROM. Per pt's spouse, the pt fell ~1 week ago and has resultant pain and difficulty raising arm. Elbow and hand AROM WFL. Functional grip strength, though generalized weakness noted. LUE Deficits / Details: AROM WFL. Functional grip strength, though generalized weakness noted.   Lower Extremity Assessment Lower Extremity Assessment: Generalized weakness;RLE deficits/detail;LLE deficits/detail RLE Deficits / Details: AROM WFL LLE Deficits / Details: AROM WFL      Communication Communication Communication: No apparent difficulties   Cognition Arousal: Alert Behavior During Therapy: WFL for tasks assessed/performed          Following commands: Intact                  Home Living Family/patient expects to be discharged to:: Private residence Living Arrangements: Spouse/significant other Available Help at Discharge: Family Type of Home: House Home Access: Level entry     Home Layout: One level     Bathroom Shower/Tub: Producer, television/film/video: Standard     Home Equipment: Rollator (4 wheels);Shower seat;Hand held shower head;BSC/3in1   Additional Comments:  (Used 2L O2 as needed, CPAP)      Prior Functioning/Environment Prior Level of Function : Independent/Modified Independent             Mobility Comments:  (He used a cane or rollator for ambulation.) ADLs Comments:  (Independent with ADLs and spouse managed cooking and cleaning.)    OT Problem List: Decreased strength;Decreased range of  motion;Decreased activity tolerance;Impaired balance (sitting and/or standing);Decreased knowledge of use of DME or AE;Decreased knowledge of precautions   OT Treatment/Interventions: Self-care/ADL training;Therapeutic exercise;Therapeutic activities;Energy conservation;DME and/or AE instruction;Patient/family education;Balance training      OT Goals(Current goals can be found in the care plan section)   Acute Rehab OT Goals OT Goal Formulation: With patient Time For Goal Achievement: 10/30/23 Potential to Achieve Goals: Good ADL Goals Pt Will Perform Grooming: with set-up;sitting Pt Will Perform Upper Body Dressing: with set-up;sitting Pt Will Transfer to Toilet: with contact guard assist;bedside commode;ambulating Additional ADL Goal #1: The pt will perform supine to sit with CGA, in prep for progressive ADL participation.   OT Frequency:  Min 2X/week    Co-evaluation   Reason for Co-Treatment: To address functional/ADL transfers;For patient/therapist safety PT goals addressed during session: Mobility/safety with mobility;Balance OT goals addressed during session: ADL's and self-care      AM-PAC OT 6 Clicks Daily Activity     Outcome Measure Help from another person eating meals?: A Little Help from another person taking care of personal grooming?: A Little Help from another person toileting, which includes using toliet, bedpan, or urinal?: Total Help from another person bathing (including washing, rinsing, drying)?: A Lot Help from another person to put on and taking off regular upper body clothing?: A Lot Help from another person to put on and taking off regular lower body clothing?: Total 6 Click Score: 12  End of Session Equipment Utilized During Treatment: Oxygen  Nurse Communication: Other (comment) (nurse cleared the pt for therapy)  Activity Tolerance: Patient limited by fatigue Patient left: in bed;with call bell/phone within reach;with bed alarm set;with  family/visitor present  OT Visit Diagnosis: Unsteadiness on feet (R26.81);Other abnormalities of gait and mobility (R26.89);Muscle weakness (generalized) (M62.81)                Time: 1348-1410 OT Time Calculation (min): 22 min Charges:  OT General Charges $OT Visit: 1 Visit OT Evaluation $OT Eval Moderate Complexity: 1 Mod    Jacquelyne Quarry L Maxfield Gildersleeve, OTR/L 10/16/2023, 4:26 PM

## 2023-10-16 NOTE — Progress Notes (Signed)
 [facility] ADULT ICU REPLACEMENT PROTOCOL   The patient does apply for the [facility] Adult ICU Electrolyte Replacment Protocol based on the criteria listed below:   1.Exclusion criteria: TCTS, ECMO, Dialysis, and Myasthenia Gravis patients 2. Is GFR >/= 30 ml/min? Yes.    Patient's GFR today is >60 3. Is SCr </= 2? Yes.   Patient's SCr is 0.64 mg/dL 4. Did SCr increase >/= 0.5 in 24 hours? No. 5.Pt's weight >40kg  Yes.   6. Abnormal electrolyte(s): K+ 3.2  7. Electrolytes replaced per protocol 8.  Call MD STAT for K+ </= 2.5, Phos </= 1, or Mag </= 1 Physician:  Epimenio Ole BIRCH [facility] 10/16/2023 5:15 AM

## 2023-10-16 NOTE — Plan of Care (Signed)
  Problem: Education: Goal: Ability to describe self-care measures that may prevent or decrease complications (Diabetes Survival Skills Education) will improve Outcome: Progressing   Problem: Coping: Goal: Ability to adjust to condition or change in health will improve Outcome: Progressing   Problem: Fluid Volume: Goal: Ability to maintain a balanced intake and output will improve Outcome: Progressing   Problem: Metabolic: Goal: Ability to maintain appropriate glucose levels will improve Outcome: Progressing   Problem: Nutritional: Goal: Maintenance of adequate nutrition will improve Outcome: Progressing Goal: Progress toward achieving an optimal weight will improve Outcome: Progressing   Problem: Skin Integrity: Goal: Risk for impaired skin integrity will decrease Outcome: Progressing   Problem: Tissue Perfusion: Goal: Adequacy of tissue perfusion will improve Outcome: Progressing   Problem: Education: Goal: Knowledge of disease or condition will improve Outcome: Progressing   Problem: Ischemic Stroke/TIA Tissue Perfusion: Goal: Complications of ischemic stroke/TIA will be minimized Outcome: Progressing   Problem: Coping: Goal: Will verbalize positive feelings about self Outcome: Progressing Goal: Will identify appropriate support needs Outcome: Progressing   Problem: Health Behavior/Discharge Planning: Goal: Goals will be collaboratively established with patient/family Outcome: Progressing   Problem: Self-Care: Goal: Ability to participate in self-care as condition permits will improve Outcome: Progressing Goal: Verbalization of feelings and concerns over difficulty with self-care will improve Outcome: Progressing Goal: Ability to communicate needs accurately will improve Outcome: Progressing   Problem: Nutrition: Goal: Risk of aspiration will decrease Outcome: Progressing Goal: Dietary intake will improve Outcome: Progressing   Problem: Health  Behavior/Discharge Planning: Goal: Ability to manage health-related needs will improve Outcome: Not Progressing   Cindy S. Loreli BSN, RN, CCRP, CCRN 10/16/2023 5:14 AM

## 2023-10-16 NOTE — TOC Progression Note (Signed)
 Transition of Care [facility]) - Progression Note   Patient Details  Name: John [name] MRN: [id number] Date of Birth: 1945/10/21  Transition of Care [facility]) CM/SW Contact  Duwaine GORMAN Aran, LCSW Phone Number: 10/16/2023, 1:11 PM  Clinical Narrative: Patient has been extubated and is currently oriented x2. PT, OT, and speech consulted. TOC following for recommendations.  Social Determinants of Health (SDOH) Interventions SDOH Screenings   Food Insecurity: Patient Unable To Answer (10/11/2023)  Housing: Patient Unable To Answer (10/11/2023)  Transportation Needs: Patient Unable To Answer (10/11/2023)  Utilities: Patient Unable To Answer (10/11/2023)  Alcohol  Screen: Low Risk  (05/30/2023)  Depression (PHQ2-9): Low Risk  (10/06/2023)  Financial Resource Strain: Low Risk  (10/07/2023)   Received from [facility]  Physical Activity: Inactive (10/07/2023)   Received from [facility]  Social Connections: Patient Unable To Answer (10/11/2023)  Recent Concern: Social Connections - Moderately Isolated (10/07/2023)   Received from [facility]  Stress: Stress Concern Present (10/07/2023)   Received from [facility]  Tobacco Use: High Risk (10/07/2023)   Received from [facility]  Health Literacy: Medium Risk (10/07/2023)   Received from [facility]   Readmission Risk Interventions    10/12/2023    9:40 AM 08/27/2021   11:44 AM  Readmission Risk Prevention Plan  Transportation Screening Complete Complete  PCP or Specialist Appt within 3-5 Days  Complete  HRI or Home Care Consult  Complete  Social Work Consult for Recovery Care Planning/Counseling  Complete  Palliative Care Screening  Not Applicable  Medication Review [employer]) Complete Complete  PCP or Specialist appointment within 3-5 days of discharge Complete   HRI or Home Care Consult Complete   SW Recovery Care/Counseling Consult Complete   Palliative Care Screening Not Applicable   Skilled Nursing Facility Not Applicable

## 2023-10-16 NOTE — Evaluation (Signed)
 Clinical/Bedside Swallow Evaluation Patient Details  Name: John [name] MRN: [id number] Date of Birth: 04-Jan-1946  Today's Date: 10/16/2023 Time: SLP Start Time (ACUTE ONLY): 1130 SLP Stop Time (ACUTE ONLY): 1210 SLP Time Calculation (min) (ACUTE ONLY): 40 min  Past Medical History:  Past Medical History:  Diagnosis Date   Adenocarcinoma of lung, right (HCC) 04/18/2016   Anxiety    Arthritis    Asthma    Atrial flutter (HCC)    BPH (benign prostatic hyperplasia)    with urinary retention 02/06/20   CHF (congestive heart failure) (HCC)    COPD (chronic obstructive pulmonary disease) (HCC)    Depression    Diabetes mellitus, type II (HCC)    GERD (gastroesophageal reflux disease)    History of blood transfusion    History of kidney stones    History of radiation therapy    right lung 09/10/2020-09/17/2020   Dr Shannon   History of radiation therapy    Left and right Lung- 06/19/22-06/26/22   Hyperlipidemia    Hypertension    Hypothyroidism    IDA (iron  deficiency anemia)    Macular degeneration    Neuropathy    hands and feet   Non-small cell lung cancer, right (HCC) 04/18/2016   Oxygen  dependent    uses 2L oxygen  as needed   PAD (peripheral artery disease) (HCC) 03/2023   managed by Dr Lonni Gaskins   Peripheral vascular disease [facility])    Prostatitis    Pulmonary nodule, left 07/16/2016   Sleep apnea 2019   uses CPAP nightly   Past Surgical History:  Past Surgical History:  Procedure Laterality Date   A-FLUTTER ABLATION N/A 01/13/2022   Procedure: A-FLUTTER ABLATION;  Surgeon: Cindie Ole DASEN, MD;  Location: [facility] INVASIVE CV LAB;  Service: Cardiovascular;  Laterality: N/A;   ABDOMINAL AORTOGRAM W/LOWER EXTREMITY Left 02/06/2020   Procedure: ABDOMINAL AORTOGRAM W/LOWER EXTREMITY;  Surgeon: Court Dorn PARAS, MD;  Location: [facility] INVASIVE CV LAB;  Service: Cardiovascular;  Laterality: Left;   ABDOMINAL AORTOGRAM W/LOWER EXTREMITY N/A 05/30/2021   Procedure:  ABDOMINAL AORTOGRAM W/LOWER EXTREMITY;  Surgeon: Gaskins Lonni PARAS, MD;  Location: [facility] INVASIVE CV LAB;  Service: Cardiovascular;  Laterality: N/A;   ABDOMINAL AORTOGRAM W/LOWER EXTREMITY N/A 02/05/2023   Procedure: ABDOMINAL AORTOGRAM W/LOWER EXTREMITY;  Surgeon: Gaskins Lonni PARAS, MD;  Location: [facility] INVASIVE CV LAB;  Service: Cardiovascular;  Laterality: N/A;   BIOPSY  09/16/2021   Procedure: BIOPSY;  Surgeon: Avram Lupita BRAVO, MD;  Location: [facility] ENDOSCOPY;  Service: Gastroenterology;;   BRONCHIAL BIOPSY  08/03/2023   Procedure: BRONCHOSCOPY, WITH BIOPSY;  Surgeon: Shelah Lamar RAMAN, MD;  Location: [facility] ENDOSCOPY;  Service: Pulmonary;;   BRONCHIAL BRUSHINGS  08/03/2023   Procedure: BRONCHOSCOPY, WITH BRUSH BIOPSY;  Surgeon: Shelah Lamar RAMAN, MD;  Location: [facility] ENDOSCOPY;  Service: Pulmonary;;   BRONCHIAL NEEDLE ASPIRATION BIOPSY  08/03/2023   Procedure: BRONCHOSCOPY, WITH NEEDLE ASPIRATION BIOPSY;  Surgeon: Shelah Lamar RAMAN, MD;  Location: [facility] ENDOSCOPY;  Service: Pulmonary;;   CARDIOVERSION N/A 10/05/2020   Procedure: CARDIOVERSION;  Surgeon: Barbaraann Darryle Ned, MD;  Location: [facility] ENDOSCOPY;  Service: Cardiovascular;  Laterality: N/A;   CATARACT EXTRACTION, BILATERAL Bilateral    COLONOSCOPY WITH PROPOFOL  N/A 06/20/2021   Procedure: COLONOSCOPY WITH PROPOFOL ;  Surgeon: Abran Norleen SAILOR, MD;  Location: [facility] ENDOSCOPY;  Service: Endoscopy;  Laterality: N/A;   COLONOSCOPY WITH PROPOFOL  N/A 09/16/2021   Procedure: COLONOSCOPY WITH PROPOFOL ;  Surgeon: Avram Lupita BRAVO, MD;  Location: [facility] ENDOSCOPY;  Service: Gastroenterology;  Laterality:  N/A;   ENDARTERECTOMY FEMORAL Right 08/20/2020   Procedure: ENDARTERECTOMY  RIGHT FEMORAL ARTERY;  Surgeon: Gretta Lonni PARAS, MD;  Location: [facility] OR;  Service: Vascular;  Laterality: Right;   ENTEROSCOPY N/A 10/11/2021   Procedure: ENTEROSCOPY;  Surgeon: Eartha Angelia Sieving, MD;  Location: [facility] ENDO SUITE;  Service: Gastroenterology;  Laterality: N/A;   ENTEROSCOPY N/A  02/18/2023   Procedure: ENTEROSCOPY;  Surgeon: Shaaron Lamar HERO, MD;  Location: [facility] ENDO SUITE;  Service: Endoscopy;  Laterality: N/A;  EGD with possible small bowel push enteroscopy   ESOPHAGOGASTRODUODENOSCOPY N/A 09/16/2021   Procedure: ESOPHAGOGASTRODUODENOSCOPY (EGD);  Surgeon: Avram Lupita BRAVO, MD;  Location: [facility] ENDOSCOPY;  Service: Gastroenterology;  Laterality: N/A;   ESOPHAGOGASTRODUODENOSCOPY (EGD) WITH PROPOFOL  N/A 09/06/2020   Procedure: ESOPHAGOGASTRODUODENOSCOPY (EGD) WITH PROPOFOL ;  Surgeon: Abran Norleen SAILOR, MD;  Location: [facility] ENDOSCOPY;  Service: Endoscopy;  Laterality: N/A;   ESOPHAGOGASTRODUODENOSCOPY (EGD) WITH PROPOFOL  N/A 10/11/2021   Procedure: ESOPHAGOGASTRODUODENOSCOPY (EGD) WITH PROPOFOL ;  Surgeon: Eartha Angelia Sieving, MD;  Location: [facility] ENDO SUITE;  Service: Gastroenterology;  Laterality: N/A;   ESOPHAGOGASTRODUODENOSCOPY (EGD) WITH PROPOFOL  N/A 02/18/2023   Procedure: ESOPHAGOGASTRODUODENOSCOPY (EGD) WITH PROPOFOL ;  Surgeon: Shaaron Lamar HERO, MD;  Location: [facility] ENDO SUITE;  Service: Endoscopy;  Laterality: N/A;   GIVENS CAPSULE STUDY N/A 02/19/2023   Procedure: GIVENS CAPSULE STUDY;  Surgeon: Cinderella Deatrice FALCON, MD;  Location: [facility] ENDO SUITE;  Service: Endoscopy;  Laterality: N/A;   HEMOSTASIS CLIP PLACEMENT  09/16/2021   Procedure: HEMOSTASIS CLIP PLACEMENT;  Surgeon: Avram Lupita BRAVO, MD;  Location: [facility] ENDOSCOPY;  Service: Gastroenterology;;   HOT HEMOSTASIS N/A 09/16/2021   Procedure: HOT HEMOSTASIS (ARGON PLASMA COAGULATION/BICAP);  Surgeon: Avram Lupita BRAVO, MD;  Location: [facility] ENDOSCOPY;  Service: Gastroenterology;  Laterality: N/A;   HOT HEMOSTASIS  10/11/2021   Procedure: HOT HEMOSTASIS (ARGON PLASMA COAGULATION/BICAP);  Surgeon: Eartha Angelia, Sieving, MD;  Location: [facility] ENDO SUITE;  Service: Gastroenterology;;   HOT HEMOSTASIS  02/18/2023   Procedure: HOT HEMOSTASIS (ARGON PLASMA COAGULATION/BICAP);  Surgeon: Shaaron Lamar HERO, MD;  Location: [facility] ENDO SUITE;  Service:  Endoscopy;;   INSERTION OF ILIAC STENT Right 08/20/2020   Procedure: RETROGRADE INSERTION OF RIGHT ILIAC STENT;  Surgeon: Gretta Lonni PARAS, MD;  Location: [facility] OR;  Service: Vascular;  Laterality: Right;   INTRAOPERATIVE ARTERIOGRAM Right 08/20/2020   Procedure: INTRA OPERATIVE ARTERIOGRAM ILIAC;  Surgeon: Gretta Lonni PARAS, MD;  Location: [facility] OR;  Service: Vascular;  Laterality: Right;   PATCH ANGIOPLASTY Right 08/20/2020   Procedure: PATCH ANGIOPLASTY RIGHT FEMORAL ARTERY;  Surgeon: Gretta Lonni PARAS, MD;  Location: [facility] OR;  Service: Vascular;  Laterality: Right;   PERIPHERAL VASCULAR BALLOON ANGIOPLASTY Right 05/30/2021   Procedure: PERIPHERAL VASCULAR BALLOON ANGIOPLASTY;  Surgeon: Gretta Lonni PARAS, MD;  Location: [facility] INVASIVE CV LAB;  Service: Cardiovascular;  Laterality: Right;   PERIPHERAL VASCULAR INTERVENTION Right 02/05/2023   Procedure: PERIPHERAL VASCULAR INTERVENTION;  Surgeon: Gretta Lonni PARAS, MD;  Location: [facility] INVASIVE CV LAB;  Service: Cardiovascular;  Laterality: Right;  Rt SFA and Rt Ext Iliac   PORTACATH PLACEMENT Left 06/13/2016   Procedure: INSERTION PORT-A-CATH;  Surgeon: Oneil Budge, MD;  Location: [facility] ORS;  Service: General;  Laterality: Left;   TRANSURETHRAL RESECTION OF PROSTATE N/A 05/31/2020   Procedure: TRANSURETHRAL RESECTION OF THE PROSTATE (TURP);  Surgeon: Sherrilee Belvie CROME, MD;  Location: [facility] ORS;  Service: Urology;  Laterality: N/A;   VIDEO BRONCHOSCOPY WITH ENDOBRONCHIAL NAVIGATION N/A 05/28/2016   Procedure: VIDEO BRONCHOSCOPY WITH ENDOBRONCHIAL NAVIGATION;  Surgeon: Elspeth JAYSON Millers,  MD;  Location: [facility] OR;  Service: Thoracic;  Laterality: N/A;   VIDEO BRONCHOSCOPY WITH ENDOBRONCHIAL NAVIGATION Bilateral 08/03/2023   Procedure: VIDEO BRONCHOSCOPY WITH ENDOBRONCHIAL NAVIGATION;  Surgeon: Shelah Lamar RAMAN, MD;  Location: [facility] ENDOSCOPY;  Service: Pulmonary;  Laterality: Bilateral;  WITH FLUORO   VIDEO BRONCHOSCOPY WITH ENDOBRONCHIAL ULTRASOUND N/A  05/28/2016   Procedure: VIDEO BRONCHOSCOPY WITH ENDOBRONCHIAL ULTRASOUND;  Surgeon: Elspeth JAYSON Millers, MD;  Location: [facility] OR;  Service: Thoracic;  Laterality: N/A;   HPI:  78 year old male admitted to [facility] with respiratory issues-required intubation from July 5 to July 10.  Has smallbore feeding tube for nutrition.  He has been requiring BiPAP off and on since extubated.  Per notes plans were to go home with hospice and critical care service documents patient will be made comfort if he declines.  He has past medical history significant for stage IV lung cancer status post chemo and radiation.  Patient hospital stay complicated by patient having CVAs.  Swallow and speech eval ordered.  CT brain showed Known small acute infarcts within the bilateral cerebral hemispheres  are occult by CT and were better appreciated on the brain MRI  performed earlier today.     A thin subdural collection along the right parietal lobe is also occult by CT.    Assessment / Plan / Recommendation  Clinical Impression  Patient demonstrates clinical indication of potential oropharyngeal dysphagia and demonstrates elevated aspiration risk due to his respiratory status.  Voice is mildly hoarse but adequate strength.  Dagoberto, wife, arrived during session and does endorse that patient had an episode of coughing when taking medication prior to admission-causing SLP to question if this may have contributed to pulmonary issue.  Dijon wants intake and was willing to consume p.o. trials with SLP.  Observed patient taking small single boluses of graham cracker moistened with applesauce, applesauce, apple juice and water .  He is verbose and benefits from cues to focus on masticating and swallowing prior to talking.  Cough noted at the end of his snack that may indicate some airway infiltration.  In addition patient with minimally prolonged mastication -but he does not have his dentures that he wears at times when he eats.  At this time  given his goals of care are not clearly elucidated, patient requiring intermittent BiPAP, NTS needed am, prolonged intubation and CVAs, recommend either consider po diet with risks and mitigation strategies or conduct instrumental evaluation to allow viewing of anatomy and physiology/swallow function.   Messaged MD with results and options - and she advised to proceed with MBS.  Will plan MBS next date with pt demonstrating improved respiratory status.  Md approved plan - Family educated and agreeable to plan.     Sips and chips pending MBS. SLP Visit Diagnosis: Dysphagia, unspecified (R13.10)    Aspiration Risk  Moderate aspiration risk    Diet Recommendation Other (Comment) (sips/chips)    Medication Administration: Via alternative means Compensations: Slow rate;Small sips/bites    Other  Recommendations Oral Care Recommendations: Oral care BID     Assistance Recommended at Discharge  TBD  Functional Status Assessment Patient has had a recent decline in their functional status and demonstrates the ability to make significant improvements in function in a reasonable and predictable amount of time.  Frequency and Duration min 1 x/week  1 week       Prognosis Prognosis for improved oropharyngeal function: Fair Barriers to Reach Goals: Time post onset;Other (Comment) (Stage IV lung cancer, deconditioning, multiple  medical issues and respiratory compromise)      Swallow Study   General HPI: 78 year old male admitted to [facility] with respiratory issues-required intubation from July 5 to July 10.  Has smallbore feeding tube for nutrition.  He has been requiring BiPAP off and on since extubated.  Per notes plans were to go home with hospice and critical care service documents patient will be made comfort if he declines.  He has past medical history significant for stage IV lung cancer status post chemo and radiation.  Patient hospital stay complicated by patient having CVAs.  Swallow and  speech eval ordered.  CT brain showed Known small acute infarcts within the bilateral cerebral hemispheres  are occult by CT and were better appreciated on the brain MRI  performed earlier today.     A thin subdural collection along the right parietal lobe is also occult by CT. Type of Study: Bedside Swallow Evaluation Previous Swallow Assessment: none in the system Diet Prior to this Study: NPO;Other (Comment);Cortrak/Small bore NG tube Temperature Spikes Noted: No Respiratory Status: Nasal cannula;Other (comment) (Bipap PRN) History of Recent Intubation: Yes Total duration of intubation (days): 6 days Date extubated: 10/15/23 Behavior/Cognition: Alert;Cooperative;Pleasant mood Oral Cavity Assessment: Dry Oral Care Completed by SLP: No Oral Cavity - Dentition: Edentulous (Patient wife has his dentures at home, she reports an should bring him in tomorrow) Vision: Functional for self-feeding Self-Feeding Abilities: Other (Comment) (Patient can hold his own cup) Patient Positioning: Upright in bed Baseline Vocal Quality: Hoarse;Low vocal intensity Volitional Cough: Weak;Other (Comment) (Productive x 1 other times of nonproductive, patient appeared to fatigue during session) Volitional Swallow: Able to elicit    Oral/Motor/Sensory Function Overall Oral Motor/Sensory Function: Generalized oral weakness Facial Strength: Other (Comment) (Decreased labial seal results and drooling when patient leans head forward)   Ice Chips Ice chips: Not tested   Thin Liquid Thin Liquid: Impaired Presentation: Straw;Spoon;Self Fed Pharyngeal  Phase Impairments: Cough - Delayed;Multiple swallows    Nectar Thick Nectar Thick Liquid: Not tested   Honey Thick Honey Thick Liquid: Not tested   Puree Puree: Within functional limits Presentation: Spoon   Solid     Solid: Impaired Oral Phase Impairments: Reduced lingual movement/coordination;Impaired mastication Oral Phase Functional Implications: Other  (comment);Prolonged oral transit;Oral holding Pharyngeal Phase Impairments: Multiple swallows      Nicolas Emmie Caldron 10/16/2023,12:26 PM  Madelin POUR, MS [facility] SLP Acute Rehab Services Office [phone]

## 2023-10-16 NOTE — Evaluation (Addendum)
 Physical Therapy Evaluation Patient Details Name: John [name] MRN: [id number] DOB: 1946/03/30 Today's Date: 10/16/2023  History of Present Illness  78 year old . Recently admitted to [facility] ER for sepsis, hypoxic respiratory failure secondary to pneumonia, AKI. Patient wanted an early discharge and sent home on 7/4 but returned to the emergency room 10/10/23 with generalized weakness, fall overnight, intubated 7/5-10. MRI- small acute punctate infarcts bilateral.PMH: adenocarcinoma of the lung and squamous cell carcinoma, COPD, asthma, OSA chronic respiratory failure on home oxygen , a flutter, CHF, neuropathy, Sleep apnea  Clinical Impression  Pt admitted with above diagnosis.  Pt currently with functional limitations due to the deficits listed below (see PT Problem List). Pt will benefit from acute skilled PT to increase their independence and safety with mobility to allow discharge.     The patient is alert , talkative, family present.  Patient reports independent with ambulation with a cane or rollator, able to perform self ADL's.  Patient assisted to sitting onto side of bed and stood  with UE support, +2 max support.   SPO2 on 2 L > 94%, HR 113-122. Patient tolerated well Patient will benefit from continued inpatient follow up therapy, <3 hours/day       If plan is discharge home, recommend the following: Two people to help with walking and/or transfers;A lot of help with bathing/dressing/bathroom;Assist for transportation   Can travel by private vehicle        Equipment Recommendations None recommended by PT  Recommendations for Other Services       Functional Status Assessment Patient has had a recent decline in their functional status and demonstrates the ability to make significant improvements in function in a reasonable and predictable amount of time.     Precautions / Restrictions Precautions Precautions: Fall Precaution/Restrictions Comments: coretrac, flexiseal, on  O2 Restrictions Weight Bearing Restrictions Per Provider Order: No      Mobility  Bed Mobility Overal bed mobility: Needs Assistance Bed Mobility: Rolling, Sidelying to Sit, Sit to Supine Rolling: Mod assist Sidelying to sit: Max assist, HOB elevated, +2 for safety/equipment, +2 for physical assistance   Sit to supine: +2 for safety/equipment, +2 for physical assistance, Max assist   General bed mobility comments: multimodal cues to roll  and push up to sitting.  max support to  reuturn to supine, +2    Transfers Overall transfer level: Needs assistance   Transfers: Sit to/from Stand Sit to Stand: Mod assist, +2 physical assistance, +2 safety/equipment           General transfer comment: stood holding onto back ot a chair, max support to power up, cues to tuck in buttocks,  Patient stood x ` 4 , small side steps along bed  x 3    Ambulation/Gait                  Stairs            Wheelchair Mobility     Tilt Bed    Modified Rankin (Stroke Patients Only)       Balance Overall balance assessment: Needs assistance, History of Falls Sitting-balance support: Bilateral upper extremity supported, Feet unsupported Sitting balance-Leahy Scale: Poor Sitting balance - Comments: teeters to each side and posterior Postural control: Posterior lean, Right lateral lean, Left lateral lean Standing balance support: During functional activity, Bilateral upper extremity supported, Reliant on assistive device for balance Standing balance-Leahy Scale: Poor Standing balance comment: maax assist to maintain balance  Pertinent Vitals/Pain Pain Assessment Faces Pain Scale: No hurt    Home Living Family/patient expects to be discharged to:: Private residence Living Arrangements: Spouse/significant other Available Help at Discharge: Family;Available 24 hours/day Type of Home: House Home Access: Level entry       Home  Layout: One level Home Equipment: Rollator (4 wheels);Shower seat;Hand held shower head;BSC/3in1 Additional Comments: O 2 2 L, CPAP    Prior Function Prior Level of Function : Independent/Modified Independent             Mobility Comments: amb with cane or rollator       Extremity/Trunk Assessment        Lower Extremity Assessment Lower Extremity Assessment: Generalized weakness    Cervical / Trunk Assessment Cervical / Trunk Assessment: Normal  Communication   Communication Communication: No apparent difficulties    Cognition Arousal: Alert Behavior During Therapy: WFL for tasks assessed/performed   PT - Cognitive impairments: Orientation   Orientation impairments: Time                   PT - Cognition Comments: states he has pna Following commands: Intact       Cueing Cueing Techniques: Verbal cues     General Comments      Exercises     Assessment/Plan    PT Assessment Patient needs continued PT services  PT Problem List Decreased strength;Decreased balance;Decreased cognition;Decreased knowledge of precautions;Decreased mobility;Decreased activity tolerance;Cardiopulmonary status limiting activity;Decreased safety awareness       PT Treatment Interventions DME instruction;Functional mobility training;Patient/family education;Therapeutic activities;Gait training;Therapeutic exercise    PT Goals (Current goals can be found in the Care Plan section)  Acute Rehab PT Goals Patient Stated Goal: agreed to  sit up PT Goal Formulation: With patient/family Time For Goal Achievement: 10/30/23 Potential to Achieve Goals: Fair    Frequency Min 2X/week     Co-evaluation PT/OT/SLP Co-Evaluation/Treatment: Yes Reason for Co-Treatment: To address functional/ADL transfers;For patient/therapist safety PT goals addressed during session: Mobility/safety with mobility;Balance OT goals addressed during session: ADL's and self-care       AM-PAC PT  6 Clicks Mobility  Outcome Measure Help needed turning from your back to your side while in a flat bed without using bedrails?: A Lot Help needed moving from lying on your back to sitting on the side of a flat bed without using bedrails?: Total Help needed moving to and from a bed to a chair (including a wheelchair)?: Total Help needed standing up from a chair using your arms (e.g., wheelchair or bedside chair)?: Total Help needed to walk in hospital room?: Total Help needed climbing 3-5 steps with a railing? : Total 6 Click Score: 7    End of Session   Activity Tolerance: Patient tolerated treatment well Patient left: in bed;with bed alarm set;with family/visitor present Nurse Communication: Mobility status PT Visit Diagnosis: Unsteadiness on feet (R26.81);History of falling (Z91.81);Difficulty in walking, not elsewhere classified (R26.2)    Time: 1345-1410 PT Time Calculation (min) (ACUTE ONLY): 25 min   Charges:   PT Evaluation $PT Eval Low Complexity: 1 Low   PT General Charges $$ ACUTE PT VISIT: 1 Visit         Darice Potters PT Acute Rehabilitation Services Office [phone]   [name] 10/16/2023, 3:41 PM

## 2023-10-16 NOTE — Progress Notes (Signed)
 [facility]                                                                               John [name], is a 78 y.o. male, DOB - 1945-08-20, FMW:[id number] Admit date - 10/10/2023    Outpatient Primary MD for the patient is Lavell, Bari LABOR, FNP  LOS - 6  days    Brief summary   78 year old with history of adenocarcinoma of the lung and squamous cell carcinoma, COPD, asthma, OSA chronic respiratory failure on home oxygen , a flutter, CHF. Recently admitted to [facility] ER for sepsis, hypoxic respiratory failure secondary to pneumonia, AKI. Patient wanted an early discharge and sent home on 7/4 but returned to the emergency room today with generalized weakness, fall overnight. Maintained on BiPAP, noted to have slowly downtrending hemoglobin which is being monitored. Was altered in the ED. Given ceftriaxone , azithromycin , Vanco, Zosyn , LR and intubated. Transferred to ICU for further management.    Patient patient has an history of stage III A adenocarcinoma diagnosed in 2018 treated with radiation, carboplatin , paclitaxel  and durvalumab .  Unfortunately he continued to smoke and PET scan in March 2025 showed recurrence in bilateral upper and lower lobes which was confirmed as stage IV squamous cell cancer stage by bronchoscopic biopsy in April 2025.  He got 1 round of chemotherapy but did not tolerate anymore and was placed on nivolumab  infusion on 7/1.      Assessment & Plan    Assessment and Plan:   Acute on chronic hypoxic, hypercarbic respiratory failure 2/2 COPD exacerbation, likely post-obstructive pneumonia, recurrent lung cancer  Sepsis, POA Complete the course of antibiotics.  Extubated yesterday. Patient has been on BIPAP PRN.  Continue with pulmicort  , yupelri  and budesonide .  He is on [place] oxygen  to keep sats greater than 90%.     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  Neurology recommended aspirin  and plavix .  Cardiology on board.    Recurrent stage 4 cancer:  Outpatient follow up with pulmonology.   Hypophosphatemia Replaced.    Anemia of chronic illness Transfuse to keep it greater than 7.    Chronic diastolic heart failure:  Echo this admission showed LVEF 55-60%, small pericardial effusion Received a dose of IV lasix  yesterday.  Resume spironolactone .   H/o Atrial flutter  S/p ablation s/p ILR.  TELE shows sinus rhythm.    Moderate to Severe AS Monitor.    Hypothyroidism Resume synthroid .    Hyperlipidemia Resume pravachol .    Type 2 DM CBG (last 3)  Recent Labs    10/16/23 0407 10/16/23 0739 10/16/23 1125  GLUCAP 92 99 144*   Resume SSI and Semglee     Nutrition:  On tube feeds.    RN Pressure Injury Documentation:    Malnutrition Type:  Nutrition Problem: Moderate Malnutrition Etiology: chronic illness   Malnutrition Characteristics:  Signs/Symptoms: mild fat depletion, moderate muscle depletion   Nutrition Interventions:  Interventions: Refer to RD note for recommendations, Tube feeding  Estimated body  mass index is 24.05 kg/m as calculated from the following:   Height as of this encounter: 5' 6 (1.676 m).   Weight as of this encounter: 67.6 kg.  Code Status: DNR DVT Prophylaxis:  SCDs Start: 10/10/23 1829   Level of Care: Level of care: ICU Family Communication: none at bedside.   Disposition Plan:     Remains inpatient appropriate:  pending clinical improvement.   Procedures:   7/1 - 10/09/2023 admitted to [facility] 7/5: transferred from [name] ED to [facility], intubated for acute hypoxic respiratory failure 7/6: off pressors  Had rhythmic motions of the legs while sedation is being weaned. EEG 7/7 negative. Ammonia negative.  CT 7/8: EEG yesterday negative, but continues to be very altered with weaning attempts. Getting MRI  7/8 echo small pericardial effusion severe AS w/ decreased SV; IVC dilated w/ suggested RAP 15cmHg LVEF 55-60%; 7/9 repeat MRI w/ contrast was neg for acute findings. Neuro feels likely hypoxic injury driving myoclonic activity and delirium. EEG neg for seizure. Did have some leg and arm jerking witnessed felt non-epileptic but noted that myoclonus could have similar appearence  Consultants:   Neurology Cardiology PCCM   Antimicrobials:   Anti-infectives (From admission, onward)    Start     Dose/Rate Route Frequency Ordered Stop   10/13/23 1200  cefTRIAXone  (ROCEPHIN ) 2 g in sodium chloride  0.9 % 100 mL IVPB        2 g 200 mL/hr over 30 Minutes Intravenous Daily 10/13/23 1118 10/16/23 1026   10/12/23 0400  vancomycin  (VANCOREADY) IVPB 1250 mg/250 mL  Status:  Discontinued        1,250 mg 166.7 mL/hr over 90 Minutes Intravenous Every 36 hours 10/10/23 1845 10/11/23 1129   10/11/23 1600  vancomycin  (VANCOREADY) IVPB 1500 mg/300 mL        1,500 mg 150 mL/hr over 120 Minutes Intravenous Every 24 hours 10/11/23 1129 10/15/23 1826   10/11/23 1200  ceFEPIme  (MAXIPIME ) 2 g in sodium chloride  0.9 % 100 mL IVPB  Status:  Discontinued        2 g 200 mL/hr over 30 Minutes Intravenous Every 8 hours 10/11/23 1129 10/13/23 1118   10/11/23 0000  ceFEPIme  (MAXIPIME ) 2 g in sodium chloride  0.9 % 100 mL IVPB  Status:  Discontinued        2 g 200 mL/hr over 30 Minutes Intravenous Every 12 hours 10/10/23 1847 10/11/23 1129   10/10/23 2000  ceFEPIme  (MAXIPIME ) 2 g in sodium chloride  0.9 % 100 mL IVPB  Status:  Discontinued        2 g 200 mL/hr over 30 Minutes Intravenous Every 12 hours 10/10/23 1845 10/10/23 1847   10/10/23 1500  vancomycin  (VANCOCIN ) IVPB 1000 mg/200 mL premix        1,000 mg 200 mL/hr over 60 Minutes Intravenous  Once 10/10/23 1453 10/10/23 1720   10/10/23 1500   piperacillin -tazobactam (ZOSYN ) IVPB 3.375 g        3.375 g 100 mL/hr over 30 Minutes Intravenous  Once 10/10/23 1453 10/10/23 1548   10/10/23 1015  cefTRIAXone  (ROCEPHIN ) 1 g in sodium chloride  0.9 % 100 mL IVPB        1 g 200 mL/hr over 30 Minutes Intravenous  Once 10/10/23 1013 10/10/23 1242   10/10/23 1015  azithromycin  (ZITHROMAX ) 500 mg in sodium chloride  0.9 % 250 mL IVPB  Status:  Discontinued        500 mg 250 mL/hr over 60 Minutes Intravenous  Every 24 hours 10/10/23 1013 10/15/23 1131        Medications  Scheduled Meds:   stroke: early stages of recovery book   Does not apply Once   arformoterol   15 mcg Nebulization BID   budesonide  (PULMICORT ) nebulizer solution  0.25 mg Nebulization BID   Chlorhexidine  Gluconate Cloth  6 each Topical Q2200   clopidogrel   75 mg Per Tube Daily   docusate  100 mg Per Tube BID   famotidine   20 mg Per Tube BID   feeding supplement (VITAL AF 1.2 CAL)  1,000 mL Per Tube Q24H   gabapentin   300 mg Per Tube TID   insulin  aspart  0-20 Units Subcutaneous Q4H   insulin  aspart  6 Units Subcutaneous Q4H   insulin  glargine-yfgn  5 Units Subcutaneous BID   levothyroxine   50 mcg Per Tube Q0600   metolazone   2.5 mg Per Tube Once per day on Monday Wednesday Friday   mouth rinse  15 mL Mouth Rinse 4 times per day   pantoprazole  (PROTONIX ) IV  40 mg Intravenous Daily   polyethylene glycol  17 g Per Tube Daily   pravastatin   40 mg Per Tube Once per day on Monday Wednesday Friday   QUEtiapine   25 mg Per Tube QHS   revefenacin   175 mcg Nebulization Daily   spironolactone   25 mg Per Tube BID   Continuous Infusions:  dexmedetomidine  (PRECEDEX ) IV infusion Stopped (10/15/23 1504)   PRN Meds:.acetaminophen , docusate sodium , hydrALAZINE , mouth rinse, polyethylene glycol, sodium chloride  flush    Subjective:   Aidyn Kellis was seen and examined today.   Requesting water  to drink. No chest pain . No nausea or vomiting.   Objective:   Vitals:    10/16/23 1000 10/16/23 1011 10/16/23 1100 10/16/23 1200  BP: (!) 149/62  (!) 164/64 (!) 135/59  Pulse: (!) 110 (!) 120 (!) 116 (!) 111  Resp: 17 (!) 24 19 17   Temp:    98.7 F (37.1 C)  TempSrc:    Axillary  SpO2: 94% 93% 97% 96%  Weight:      Height:        Intake/Output Summary (Last 24 hours) at 10/16/2023 1320 Last data filed at 10/16/2023 1154 Gross per 24 hour  Intake 1351.62 ml  Output 4905 ml  Net -3553.38 ml   Filed Weights   10/14/23 0441 10/15/23 0500 10/16/23 0200  Weight: 74 kg 74 kg 67.6 kg     Exam General exam: Appears calm and comfortable  Respiratory system: diminished air entry at bases on [place] oxygen .  Cardiovascular system: S1 & S2 heard, tachycardic, no JVD.  Gastrointestinal system: Abdomen is nondistended, soft and nontender.  Central nervous system: Alert and oriented. Extremities: no pedal edema.  Skin: No rashes,  Psychiatry:  Mood & affect appropriate.    Data Reviewed:  I have personally reviewed following labs and imaging studies   CBC Lab Results  Component Value Date   WBC 24.2 (H) 10/16/2023   RBC 3.00 (L) 10/16/2023   HGB 9.7 (L) 10/16/2023   HCT 31.7 (L) 10/16/2023   MCV 105.7 (H) 10/16/2023   MCH 32.3 10/16/2023   PLT 567 (H) 10/16/2023   MCHC 30.6 10/16/2023   RDW 17.5 (H) 10/16/2023   LYMPHSABS 1.0 10/10/2023   MONOABS 1.6 (H) 10/10/2023   EOSABS 0.3 10/10/2023   BASOSABS 0.1 10/10/2023     Last metabolic panel Lab Results  Component Value Date   NA 137 10/16/2023   K 3.2 (  L) 10/16/2023   CL 100 10/16/2023   CO2 28 10/16/2023   BUN 25 (H) 10/16/2023   CREATININE 0.64 10/16/2023   GLUCOSE 106 (H) 10/16/2023   GFRNONAA >60 10/16/2023   GFRAA 102 05/24/2020   CALCIUM 8.7 (L) 10/16/2023   PHOS 2.1 (L) 10/15/2023   PROT 6.4 (L) 10/11/2023   ALBUMIN 1.8 (L) 10/11/2023   LABGLOB 2.5 08/12/2023   AGRATIO 1.3 09/15/2022   BILITOT 0.6 10/11/2023   ALKPHOS 71 10/11/2023   AST 12 (L) 10/11/2023   ALT 10 10/11/2023    ANIONGAP 9 10/16/2023    CBG (last 3)  Recent Labs    10/16/23 0407 10/16/23 0739 10/16/23 1125  GLUCAP 92 99 144*      Coagulation Profile: Recent Labs  Lab 10/10/23 1011  INR 1.1     Radiology Studies: [facility] Chest Port 1 View Result Date: 10/15/2023 CLINICAL DATA:  Respiratory failure. EXAM: PORTABLE CHEST 1 VIEW COMPARISON:  October 11, 2023. FINDINGS: Stable cardiomediastinal silhouette. Feeding tube is seen entering stomach. Left subclavian Port-A-Cath is unchanged. Stable left apical density is noted consistent with mass and radiation fibrosis related to lung cancer. Stable right suprahilar density is noted as well concerning for mass and or fibrosis. Bony thorax is unremarkable. IMPRESSION: Feeding tube is seen entering stomach. Stable bilateral upper lobe opacities are noted as described above consistent with history of bilateral lung cancer. Electronically Signed   By: Lynwood Landy Raddle M.D.   On: 10/15/2023 19:36   DG Abd 1 View Result Date: 10/15/2023 CLINICAL DATA:  Encounter for NG tube placement EXAM: ABDOMEN - 1 VIEW COMPARISON:  Abdomen x-ray October 15, 2023. FINDINGS: Enteric catheters in place with the tip at the level of the pylorus, pulled back to prior. The bowel gas pattern is normal. No radio-opaque calculi or other significant radiographic abnormality are seen. IMPRESSION: Negative.  Enteric catheter in proper position. Electronically Signed   By: Megan  Zare M.D.   On: 10/15/2023 11:47   DG Abd 1 View Result Date: 10/15/2023 CLINICAL DATA:  Enteric tube placement EXAM: ABDOMEN - 1 VIEW COMPARISON:  Abdominal radiograph dated 10/07/2022 FINDINGS: Post pyloric feeding tube tip projects over the distal duodenum. Additional enteric catheter tip projects over the stomach. Partially imaged central venous catheter tip projects over the lower SVC. Implanted loop recorder is seen projecting over the medial left hemithorax. Nonobstructive partially imaged bowel gas pattern. No free  air or pneumatosis. IMPRESSION: 1. Post pyloric feeding tube tip projects over the distal duodenum. 2. Additional enteric catheter tip projects over the stomach. Electronically Signed   By: Limin  Xu M.D.   On: 10/15/2023 10:15   EEG adult Result Date: 10/14/2023 Shelton Arlin KIDD, MD     10/14/2023  4:57 PM Patient Name: HOLLIE BARTUS MRN: [id number] Epilepsy Attending: Arlin KIDD Shelton Referring Physician/Provider: Jerrie Lola CROME, MD Date: 10/14/2023 Duration: 30.17 mins Patient history:  78yo M with ams. EEG to evaluate for seizure  Level of alertness: awake  AEDs during EEG study: GBP  Technical aspects: This EEG was obtained using a 10 lead EEG system positioned circumferentially without any parasagittal coverage (rapid EEG). Computer selected EEG is reviewed as  well as background features and all clinically significant events.  Description: EEG showed continuous generalized 3 to 7 Hz theta-delta slowing, at times with triphasic morphology. Hyperventilation and photic stimulation were not performed.   Patient was noted to have brief leg jerk at 1615 and left arm jerking at 1618. Concomitant  eeg before, during and after the event didn't show eeg change to suggest seizure.  ABNORMALITY - Continuous slow, generalized  IMPRESSION: This EEG is suggestive of moderate diffuse encephalopathy.  No seizures or definite epileptiform discharges were seen throughout the recording. Patient was noted to have brief leg jerk at 1615 and left arm jerking at 1618 without concomitant eeg change. These events were most likely NON epileptic. Myoclonus can have similar appearance.  Priyanka O Yadav       Marialuiza Car M.D. [facility] 10/16/2023, 1:20 PM  Available via Epic secure chat 7am-7pm After 7 pm, please refer to night coverage provider listed on amion.

## 2023-10-16 NOTE — Progress Notes (Signed)
 SLP Cancellation Note  Patient Details Name: John [name] MRN: [id number] DOB: 02/24/1946   Cancelled treatment:       Reason Eval/Treat Not Completed: Medical issues which prohibited therapy (Patient currently on Bipap, resting)  Gabriel Paulding MA, CCC-SLP  John [name] 10/16/2023, 10:14 AM

## 2023-10-16 NOTE — Progress Notes (Addendum)
 Progress Note  Patient Name: John [name] Date of Encounter: 10/16/2023 [facility] Cardiologist: John ONEIDA Decent, MD   Interval Summary   Extubated 7/10 with Cortrak placement Chart review showed that he required BiPAP and additional IV Lasix  dose yesterday  Urine output -4.6 L yesterday  Patient alert and awake this morning  Tells me he feels much better, just wishes he did not have a tube in his nose   Vital Signs Vitals:   10/16/23 0526 10/16/23 0744 10/16/23 0800 10/16/23 1011  BP:      Pulse:    (!) 120  Resp:    (!) 24  Temp:   98.8 F (37.1 C)   TempSrc:   Oral   SpO2: 96% 96%  93%  Weight:      Height:        Intake/Output Summary (Last 24 hours) at 10/16/2023 1020 Last data filed at 10/16/2023 0615 Gross per 24 hour  Intake 1676.05 ml  Output 5505 ml  Net -3828.95 ml      10/16/2023    2:00 AM 10/15/2023    5:00 AM 10/14/2023    4:41 AM  Last 3 Weights  Weight (lbs) 149 lb 0.5 oz 163 lb 2.3 oz 163 lb 2.3 oz  Weight (kg) 67.6 kg 74 kg 74 kg      Telemetry/ECG  Sinus tach, HR 100-120s - Personally Reviewed  Physical Exam  GEN: No acute distress.   Neck: mild JVD Cardiac: RRR, distant heart sounds Respiratory: decreased breath sounds anteriorly. GI: Soft, nontender, non-distended  MS: No edema  Assessment & Plan  78 y.o. male  with a hx of typical atrial flutter s/p ablation 01/2022, chronic anemia, chronic HFpEF, HTN, HLD, advanced COPD on chronic oxygen , PAD s/p right femoral  endarterectomy on 08/2020 and right common iliac artery stent on 08/2020, non-small cell lung CA s/p chemoradiation therapy in 2018, who is being seen for history of A. Flutter with newly discovered AS   Chronic HFpEF Echo this admission showed LVEF 55-60%, small pericardial effusion Net +2 L this admission with 4.6 L urine output yesterday post Lasix  (40 mg then 80 mg) Patient was extubated yesterday, began getting weaker, required BiPAP, given additional IV Lasix   dose Currently on BiPAP but reports his breathing feels better  Continue Zaroxolyn  and spironolactone   Consider additional dose of IV Lasix  given patient having some difficulty post-extubation and still on BiPAP  History of typical atrial flutter S/p ablation 01/2022 S/p ILR  ILR showed no atrial fib/flutter on last interrogation Telemetry has been sinus rhythm this admission No plans for [name] with no recent arrhythmia, frailty of patient and high risk for bleeding   Moderate to severe AS  Seen on echo this admission  No obvious vegetations noted Continue with conservative therapy   Per primary COPD exacerbation  Hypothyroidism  FTT Acute on chronic respiratory failure  Sepsis     For questions or updates, please contact [facility] Please consult [email or web address] for contact info under       Signed, John DELENA Donath, PA-C   History and all data above reviewed.  I personally took the history today, performed the physical exam and formulated the assessment and plan.  I reviewed all relevant tests and studies. He actually currently is on [place] and looks good denies SOB.  He is moving his legs in the bed to try to get some exercise.  He wants to eat.  Patient examined.  I agree with  the findings as above.  The patient exam reveals COR:RRR  ,  Lungs: Decreased breath sounds but relatively clear  ,  Abd: Positive bowel sounds, no rebound no guarding, Ext No edema  .  All available labs, radiology testing, previous records reviewed. Agree with documented assessment and plan. HF preserved EF:  Continue with diuresis .  He was very positive this admission.  I would dose daily based on BP and HR.  BP currently is elevated.  He was on Dilt CD and this could be started back with IR Dilt if BP creeps up.  We will follow for med titration.    John [name]  10:46 AM  10/16/2023

## 2023-10-16 NOTE — Progress Notes (Signed)
 Nutrition Follow-up  DOCUMENTATION CODES:   Non-severe (moderate) malnutrition in context of chronic illness  INTERVENTION:  - Plan to resume full strength feeds via Cortrak while off BiPAP, but reduce rate to trickle while on BiPAP.  - While patient OFF BiPAP: Vital 1.5 at 45 ml/h (1080 ml per day) Prosource TF20 60 ml BID Provides 1780 kcal, 113 gm protein, 825 ml free water  daily  - While patient ON BiPAP: Trickle tube feeds only: Vital 1.5 @ 26mL/hr   - Monitor magnesium , potassium, and phosphorus daily for 3 days, MD to replete as needed.  - FWF per MD.  - Monitor weight trends.  - Will monitor for diet advancement pending MBS tomorrow.  - If diet advanced, recommend Ensure Plus High Protein po BID as patient reports enjoying it. Each supplement provides 350 kcal and 20 grams of protein.   NUTRITION DIAGNOSIS:   Moderate Malnutrition related to chronic illness as evidenced by mild fat depletion, moderate muscle depletion. *ongoing  GOAL:   Patient will meet greater than or equal to 90% of their needs *progressing, on TF  MONITOR:   Vent status, Labs, Weight trends, TF tolerance  REASON FOR ASSESSMENT:   Consult Enteral/tube feeding initiation and management  ASSESSMENT:   78 year old with PMH of adenocarcinoma of the lung and squamous cell carcinoma, COPD, asthma, OSA chronic respiratory failure, CHF who was recently admitted for sepsis, hypoxic respiratory failure secondary to pneumonia, AKI and was discharged 7/4 but returned 7/5 with generalized weakness, fall overnight.  7/5 Admit; Intubated  7/6 Vital HP initiated 7/7 Changed to Vital 1.2 7/10 Extubated, Cortrak placed, TF's resumed @ trickles 7/11 SLP eval -> plan for MBS  Patient continues on trickle feeds at 41mL/hr today. Per RN tolerating well.  Patient finishing up SLP eval at time of visit. Results of eval discussed with MD and MD advising to plan for an MBS, to happen tomorrow.   For now,  patient intermittently requiring BiPAP. He endorses being hungry. Would like Ensure if diet advanced.  Discussed with attending Dr. Cherlyn, plan to increase tube feeds to goal when off BiPAP, but only run trickles while on BiPAP. Discussed plan for tube feeds with RN  Discussed plan with SLP as well.   Admit weight: 152# Current weight: 149# I&O's: +2.6L  Medications reviewed and include: Colace, Miralax    Labs reviewed:  K+ 3.2 HA1C 7.9 Blood Glucose 91-171 x24 hours   Diet Order:   Diet Order             Diet NPO time specified  Diet effective now                   EDUCATION NEEDS:  Not appropriate for education at this time  Skin:  Skin Assessment: Reviewed RN Assessment  Last BM:  7/11 - rectal tube  Height:  Ht Readings from Last 1 Encounters:  10/16/23 5' 6 (1.676 m)   Weight:  Wt Readings from Last 1 Encounters:  10/16/23 67.6 kg   Ideal Body Weight:  64.55 kg  BMI:  Body mass index is 24.05 kg/m.  Estimated Nutritional Needs:  Kcal:  1750-1950 kcals Protein:  90-105 grams Fluid:  >/= 1.8L    Trude Ned RD, LDN Contact via Secure Chat.

## 2023-10-17 ENCOUNTER — Inpatient Hospital Stay (HOSPITAL_COMMUNITY)

## 2023-10-17 ENCOUNTER — Ambulatory Visit: Payer: Self-pay | Admitting: Cardiology

## 2023-10-17 DIAGNOSIS — J9601 Acute respiratory failure with hypoxia: Secondary | ICD-10-CM | POA: Diagnosis not present

## 2023-10-17 DIAGNOSIS — J9621 Acute and chronic respiratory failure with hypoxia: Secondary | ICD-10-CM | POA: Diagnosis not present

## 2023-10-17 DIAGNOSIS — E440 Moderate protein-calorie malnutrition: Secondary | ICD-10-CM | POA: Diagnosis not present

## 2023-10-17 DIAGNOSIS — A419 Sepsis, unspecified organism: Secondary | ICD-10-CM | POA: Diagnosis not present

## 2023-10-17 DIAGNOSIS — R000 Tachycardia, unspecified: Secondary | ICD-10-CM | POA: Diagnosis not present

## 2023-10-17 DIAGNOSIS — I5030 Unspecified diastolic (congestive) heart failure: Secondary | ICD-10-CM | POA: Diagnosis not present

## 2023-10-17 LAB — CBC
HCT: 28.7 % — ABNORMAL LOW (ref 39.0–52.0)
Hemoglobin: 8.9 g/dL — ABNORMAL LOW (ref 13.0–17.0)
MCH: 33.2 pg (ref 26.0–34.0)
MCHC: 31 g/dL (ref 30.0–36.0)
MCV: 107.1 fL — ABNORMAL HIGH (ref 80.0–100.0)
Platelets: 436 K/uL — ABNORMAL HIGH (ref 150–400)
RBC: 2.68 MIL/uL — ABNORMAL LOW (ref 4.22–5.81)
RDW: 17.7 % — ABNORMAL HIGH (ref 11.5–15.5)
WBC: 18.3 K/uL — ABNORMAL HIGH (ref 4.0–10.5)
nRBC: 0 % (ref 0.0–0.2)

## 2023-10-17 LAB — BASIC METABOLIC PANEL WITH GFR
Anion gap: 10 (ref 5–15)
BUN: 22 mg/dL (ref 8–23)
CO2: 28 mmol/L (ref 22–32)
Calcium: 8.9 mg/dL (ref 8.9–10.3)
Chloride: 100 mmol/L (ref 98–111)
Creatinine, Ser: 0.66 mg/dL (ref 0.61–1.24)
GFR, Estimated: >60 mL/min (ref >60)
Glucose, Bld: 127 mg/dL — ABNORMAL HIGH (ref 70–99)
Potassium: 3.2 mmol/L — ABNORMAL LOW (ref 3.5–5.1)
Sodium: 138 mmol/L (ref 135–145)

## 2023-10-17 LAB — GLUCOSE, CAPILLARY
Glucose-Capillary: 100 mg/dL — ABNORMAL HIGH (ref 70–99)
Glucose-Capillary: 103 mg/dL — ABNORMAL HIGH (ref 70–99)
Glucose-Capillary: 131 mg/dL — ABNORMAL HIGH (ref 70–99)
Glucose-Capillary: 91 mg/dL (ref 70–99)
Glucose-Capillary: 96 mg/dL (ref 70–99)
Glucose-Capillary: 99 mg/dL (ref 70–99)

## 2023-10-17 LAB — PHOSPHORUS: Phosphorus: 3.1 mg/dL (ref 2.5–4.6)

## 2023-10-17 LAB — MAGNESIUM: Magnesium: 2.3 mg/dL (ref 1.7–2.4)

## 2023-10-17 MED ORDER — ORAL CARE MOUTH RINSE: 15.0000 mL | OROMUCOSAL | Status: DC | PRN

## 2023-10-17 MED ORDER — ORAL CARE MOUTH RINSE
15.0000 mL | OROMUCOSAL | Status: DC | PRN
Start: 2023-10-17 — End: 2023-10-17

## 2023-10-17 MED ORDER — METOPROLOL TARTRATE 25 MG PO TABS
25.0000 mg | ORAL_TABLET | Freq: Two times a day (BID) | ORAL | Status: DC
  Administered 2023-10-17 – 2023-10-18 (×3): 25 mg
  Filled 2023-10-17 (×2): qty 1

## 2023-10-17 MED ORDER — SODIUM CHLORIDE 0.9 % IV SOLN: 2.0000 g | INTRAVENOUS | Status: DC

## 2023-10-17 MED ORDER — METOPROLOL TARTRATE 25 MG PO TABS
25.0000 mg | ORAL_TABLET | Freq: Two times a day (BID) | ORAL | Status: DC
  Filled 2023-10-17: qty 1

## 2023-10-17 MED ORDER — FUROSEMIDE 10 MG/ML IJ SOLN
60.0000 mg | Freq: Once | INTRAMUSCULAR | Status: AC
  Administered 2023-10-17: 60 mg via INTRAVENOUS
  Filled 2023-10-17: qty 6

## 2023-10-17 MED ORDER — POTASSIUM CHLORIDE CRYS ER 20 MEQ PO TBCR
40.0000 meq | EXTENDED_RELEASE_TABLET | Freq: Two times a day (BID) | ORAL | Status: AC
  Administered 2023-10-17 – 2023-10-18 (×2): 40 meq via ORAL
  Filled 2023-10-17 (×2): qty 2

## 2023-10-17 MED ORDER — ORAL CARE MOUTH RINSE: 15.0000 mL | OROMUCOSAL | Status: DC

## 2023-10-17 NOTE — Progress Notes (Signed)
  Progress Note  Patient Name: John [name] Date of Encounter: 10/17/2023 [facility] Cardiologist: Darryle ONEIDA Decent, MD    Pt says his breathing is better   Wants to know when he can go home   Vital Signs Vitals:   10/17/23 0329 10/17/23 0358 10/17/23 0400 10/17/23 0500  BP: (!) 156/60  (!) 155/64 (!) 174/70  Pulse: (!) 102  (!) 101 (!) 101  Resp: 14  12 14   Temp:  98.4 F (36.9 C)    TempSrc:  Axillary    SpO2: 100%  100% 100%  Weight:    70.2 kg  Height:        Intake/Output Summary (Last 24 hours) at 10/17/2023 9385 Last data filed at 10/17/2023 0500 Gross per 24 hour  Intake 734 ml  Output 3075 ml  Net -2341 ml   Net neg 1.4L      10/17/2023    5:00 AM 10/16/2023    2:00 AM 10/15/2023    5:00 AM  Last 3 Weights  Weight (lbs) 154 lb 12.2 oz 149 lb 0.5 oz 163 lb 2.3 oz  Weight (kg) 70.2 kg 67.6 kg 74 kg      Telemetry/ECG  SR/ST  Average HR 100 - Personally Reviewed  Physical Exam  GEN: No acute distress.   Neck: mild JVD Cardiac: RRR, II VI systolic murmur  Respiratory: MOving air   No rales   GI: Soft, nontender, non-distended  MS: No edema  Assessment & Plan  78 y.o. male  with a hx of typical atrial flutter s/p ablation 01/2022, chronic anemia, chronic HFpEF, HTN, HLD, advanced COPD on chronic oxygen , PAD s/p right femoral  endarterectomy on 08/2020 and right common iliac artery stent on 08/2020, non-small cell lung CA s/p chemoradiation therapy in 2018, who is being seen for history of A. Flutter with newly discovered AS   HFpEF    Pt presented with volume overload in setting of COPD exacerbation He has diuresed with Lasix  (dosed intermittentllY  ALso on Spironolactone   and Metolazone     Prior to dmit he wa on 20 bid of demedex     Volume has improved    I would give 60 IV lasix  x1  Follow response  Stop Zaroxolyne    Keep on spironolactone      Rhythm   1  Hx of atrial flutter  s/p ablation  2023     No anticoagulation   2  Hx of atrial  tachycardia      HR avg around 100 I would recomm metoprolol  25 bid ( noted fatigue, but not true allergy that I can see)    Follow response   Should do better if HR lower     Moderate to severe AS  Seen on echo this admission  This will need to be followed      For questions or updates, please contact  [facility] Please consult [email or web address] for contact info under       Signed, Vina Gull, MD

## 2023-10-17 NOTE — Progress Notes (Signed)
   10/17/23 0026  BiPAP/CPAP/SIPAP  BiPAP/CPAP/SIPAP Pt Type Adult  BiPAP/CPAP/SIPAP SERVO  Mask Type Full face mask  Dentures removed? Not applicable  Mask Size Medium  Respiratory Rate 18 breaths/min  Pressure Support 7 cmH20  PEEP 5 cmH20  FiO2 (%) 40 %  Minute Ventilation 10.7  Leak 59  Peak Inspiratory Pressure (PIP) 12  Tidal Volume (Vt) 650  Patient Home Machine No  Patient Home Mask No  Patient Home Tubing No  Auto Titrate No  Press High Alarm 25 cmH2O  Device Plugged into RED Power Outlet Yes  Oxygen  Percent 40 %  BiPAP/CPAP /SiPAP Vitals  Pulse Rate (!) 119  Resp 18  SpO2 100 %  MEWS Score/Color  MEWS Score 2  MEWS Score Color Yellow

## 2023-10-17 NOTE — Plan of Care (Signed)
   Problem: Education: Goal: Ability to describe self-care measures that may prevent or decrease complications (Diabetes Survival Skills Education) will improve Outcome: Progressing

## 2023-10-17 NOTE — TOC Progression Note (Signed)
 Transition of Care [facility] - Progression Note    Patient Details  Name: John [name] MRN: [id number] Date of Birth: [birth year]  Transition of Care [facility]) CM/SW Contact  Sheri ONEIDA Sharps, [place] Phone Number: 10/17/2023, 3:14 PM  Clinical Narrative:    Pt recommended for SNF. PASRR obtained and FL2 completed. SNF referrals faxed; awaiting bed offers.        Expected Discharge Plan and Services                                               Social Determinants of Health (SDOH) Interventions SDOH Screenings   Food Insecurity: Patient Unable To Answer (10/11/2023)  Housing: Patient Unable To Answer (10/11/2023)  Transportation Needs: Patient Unable To Answer (10/11/2023)  Utilities: Patient Unable To Answer (10/11/2023)  Alcohol  Screen: Low Risk  (05/30/2023)  Depression (PHQ2-9): Low Risk  (10/06/2023)  Financial Resource Strain: Low Risk  (10/07/2023)   Received from [facility]  Physical Activity: Inactive (10/07/2023)   Received from [facility]  Social Connections: Patient Unable To Answer (10/11/2023)  Recent Concern: Social Connections - Moderately Isolated (10/07/2023)   Received from [facility]  Stress: Stress Concern Present (10/07/2023)   Received from [facility]  Tobacco Use: High Risk (10/07/2023)   Received from [facility]  Health Literacy: Medium Risk (10/07/2023)   Received from [facility]    Readmission Risk Interventions    10/12/2023    9:40 AM 08/27/2021   11:44 AM  Readmission Risk Prevention Plan  Transportation Screening Complete Complete  PCP or Specialist Appt within 3-5 Days  Complete  HRI or Home Care Consult  Complete  Social Work Consult for Recovery Care Planning/Counseling  Complete  Palliative Care Screening  Not Applicable  Medication Review Oceanographer) Complete Complete  PCP or Specialist appointment within 3-5 days of discharge Complete   HRI or Home Care Consult Complete   SW Recovery Care/Counseling Consult  Complete   Palliative Care Screening Not Applicable   Skilled Nursing Facility Not Applicable

## 2023-10-17 NOTE — Progress Notes (Signed)
 [facility]                                                                               John [name], is a 78 y.o. male, DOB - 08/17/1945, FMW:[id number] Admit date - 10/10/2023    Outpatient Primary MD for the patient is Lavell, Bari LABOR, FNP  LOS - 7  days    Brief summary   78 year old with history of adenocarcinoma of the lung and squamous cell carcinoma, COPD, asthma, OSA chronic respiratory failure on home oxygen , a flutter, CHF. Recently admitted to [facility] ER for sepsis, hypoxic respiratory failure secondary to pneumonia, AKI. Patient wanted an early discharge and sent home on 7/4 but returned to the emergency room today with generalized weakness, fall overnight. Maintained on BiPAP, noted to have slowly downtrending hemoglobin which is being monitored. Was altered in the ED. Given ceftriaxone , azithromycin , Vanco, Zosyn , LR and intubated. Transferred to ICU for further management.    Patient patient has an history of stage III A adenocarcinoma diagnosed in 2018 treated with radiation, carboplatin , paclitaxel  and durvalumab .  Unfortunately he continued to smoke and PET scan in March 2025 showed recurrence in bilateral upper and lower lobes which was confirmed as stage IV squamous cell cancer stage by bronchoscopic biopsy in April 2025.  He got 1 round of chemotherapy but did not tolerate anymore and was placed on nivolumab  infusion on 7/1.      Assessment & Plan    Assessment and Plan:   Acute on chronic hypoxic, hypercarbic respiratory failure 2/2 COPD exacerbation, likely post-obstructive pneumonia, recurrent lung cancer  with a component of fluid overload Sepsis, POA Completed the course of antibiotics.  Extubated on 7/10. Patient has been on BIPAP PRN.  Continue with pulmicort  , yupelri  and budesonide .  He is on  2 lit [place] oxygen  to keep sats greater than 90%. . He is coughing more today though he remains on 2 lit of [place] oxygen  with sats greater than 90%.  Get  repeat CXR today.  Restarted the patient on IV ceftriaxone  for persistent pneumonia.  Get sputum cultures.     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  Neurology recommended aspirin  and plavix .  Cardiology on board. Therapy evaluations ordered.  Palliative care consulted in view of poor progression.    Recurrent stage 4 cancer:  Outpatient follow up with oncology.   Hypophosphatemia Replaced.    Anemia of chronic illness Transfuse to keep it greater than 7.    Acute on Chronic diastolic heart failure:  Prn lasix  one dose of 60 mg dose given.  Echo this admission showed LVEF 55-60%, small pericardial effusion Resume spironolactone .   H/o Atrial flutter  S/p ablation s/p ILR.  TELE shows sinus rhythm.    Moderate to Severe AS Monitor.    Hypothyroidism Resume synthroid .    Hyperlipidemia Resume pravachol .    Type 2 DM CBG (last 3)  Recent Labs    10/16/23 2345 10/17/23 0325 10/17/23 0746  GLUCAP 104*  96 103*   Resume SSI and Semglee     Nutrition:  On tube feeds which are on hold for now for persistent cough.  MBS later today.  Clears started today.    RN Pressure Injury Documentation:    Malnutrition Type:  Nutrition Problem: Moderate Malnutrition Etiology: chronic illness   Malnutrition Characteristics:  Signs/Symptoms: mild fat depletion, moderate muscle depletion   Nutrition Interventions:  Interventions: Refer to RD note for recommendations, Tube feeding  Estimated body mass index is 24.98 kg/m as calculated from the following:   Height as of this encounter: 5' 6 (1.676 m).   Weight as of this encounter: 70.2 kg.  Code Status: DNR DVT Prophylaxis:  SCDs Start: 10/10/23 1829   Level of Care: Level of care:  ICU Family Communication: none at bedside.   Disposition Plan:     Remains inpatient appropriate:  pending clinical improvement.   Procedures:   7/1 - 10/09/2023 admitted to [facility] 7/5: transferred from [name], ED to [facility], intubated for acute hypoxic respiratory failure 7/6: off pressors  Had rhythmic motions of the legs while sedation is being weaned. EEG 7/7 negative. Ammonia negative. CT 7/8: EEG yesterday negative, but continues to be very altered with weaning attempts. Getting MRI  7/8 echo small pericardial effusion severe AS w/ decreased SV; IVC dilated w/ suggested RAP 15cmHg LVEF 55-60%; 7/9 repeat MRI w/ contrast was neg for acute findings. Neuro feels likely hypoxic injury driving myoclonic activity and delirium. EEG neg for seizure. Did have some leg and arm jerking witnessed felt non-epileptic but noted that myoclonus could have similar appearence  Consultants:   Neurology Cardiology PCCM   Antimicrobials:   Anti-infectives (From admission, onward)    Start     Dose/Rate Route Frequency Ordered Stop   10/13/23 1200  cefTRIAXone  (ROCEPHIN ) 2 g in sodium chloride  0.9 % 100 mL IVPB        2 g 200 mL/hr over 30 Minutes Intravenous Daily 10/13/23 1118 10/16/23 1026   10/12/23 0400  vancomycin  (VANCOREADY) IVPB 1250 mg/250 mL  Status:  Discontinued        1,250 mg 166.7 mL/hr over 90 Minutes Intravenous Every 36 hours 10/10/23 1845 10/11/23 1129   10/11/23 1600  vancomycin  (VANCOREADY) IVPB 1500 mg/300 mL        1,500 mg 150 mL/hr over 120 Minutes Intravenous Every 24 hours 10/11/23 1129 10/15/23 1826   10/11/23 1200  ceFEPIme  (MAXIPIME ) 2 g in sodium chloride  0.9 % 100 mL IVPB  Status:  Discontinued        2 g 200 mL/hr over 30 Minutes Intravenous Every 8 hours 10/11/23 1129 10/13/23 1118   10/11/23 0000  ceFEPIme  (MAXIPIME ) 2 g in sodium chloride  0.9 % 100 mL IVPB  Status:  Discontinued        2 g 200 mL/hr over 30 Minutes Intravenous Every 12  hours 10/10/23 1847 10/11/23 1129   10/10/23 2000  ceFEPIme  (MAXIPIME ) 2 g in sodium chloride  0.9 % 100 mL IVPB  Status:  Discontinued        2 g 200 mL/hr over 30 Minutes Intravenous Every 12 hours 10/10/23 1845 10/10/23 1847   10/10/23 1500  vancomycin  (VANCOCIN ) IVPB 1000 mg/200 mL premix        1,000 mg 200 mL/hr over 60 Minutes Intravenous  Once 10/10/23 1453 10/10/23 1720   10/10/23 1500  piperacillin -tazobactam (ZOSYN ) IVPB 3.375 g        3.375 g 100 mL/hr over 30  Minutes Intravenous  Once 10/10/23 1453 10/10/23 1548   10/10/23 1015  cefTRIAXone  (ROCEPHIN ) 1 g in sodium chloride  0.9 % 100 mL IVPB        1 g 200 mL/hr over 30 Minutes Intravenous  Once 10/10/23 1013 10/10/23 1242   10/10/23 1015  azithromycin  (ZITHROMAX ) 500 mg in sodium chloride  0.9 % 250 mL IVPB  Status:  Discontinued        500 mg 250 mL/hr over 60 Minutes Intravenous Every 24 hours 10/10/23 1013 10/15/23 1131        Medications  Scheduled Meds:   stroke: early stages of recovery book   Does not apply Once   arformoterol   15 mcg Nebulization BID   budesonide  (PULMICORT ) nebulizer solution  0.25 mg Nebulization BID   Chlorhexidine  Gluconate Cloth  6 each Topical Q2200   clopidogrel   75 mg Per Tube Daily   docusate  100 mg Per Tube BID   famotidine   20 mg Per Tube BID   feeding supplement (PROSource TF20)  60 mL Per Tube BID   furosemide   60 mg Intravenous Once   gabapentin   300 mg Per Tube TID   insulin  aspart  0-20 Units Subcutaneous Q4H   insulin  glargine-yfgn  5 Units Subcutaneous BID   levothyroxine   50 mcg Per Tube Q0600   metoprolol  tartrate  25 mg Oral BID   mouth rinse  15 mL Mouth Rinse 4 times per day   pantoprazole  (PROTONIX ) IV  40 mg Intravenous Daily   polyethylene glycol  17 g Per Tube Daily   pravastatin   40 mg Per Tube Once per day on Monday Wednesday Friday   QUEtiapine   25 mg Per Tube QHS   revefenacin   175 mcg Nebulization Daily   spironolactone   25 mg Per Tube BID   Continuous  Infusions:  dexmedetomidine  (PRECEDEX ) IV infusion Stopped (10/15/23 1504)   feeding supplement (VITAL 1.5 CAL) 20 mL/hr at 10/17/23 0000   PRN Meds:.acetaminophen , docusate sodium , hydrALAZINE , mouth rinse, polyethylene glycol, sodium chloride  flush    Subjective:   John [name] was seen and examined today.   Requesting CPAP on discharge. Reports did not sleep well at night.  Coughing.   Objective:   Vitals:   10/17/23 0600 10/17/23 0700 10/17/23 0800 10/17/23 0900  BP: (!) 150/87 (!) 148/73 (!) 188/76 (!) 145/57  Pulse: (!) 107 (!) 107 (!) 109 (!) 112  Resp: 17 19 15 15   Temp:   98.5 F (36.9 C)   TempSrc:   Axillary   SpO2: 100% 100% 100% 99%  Weight:      Height:        Intake/Output Summary (Last 24 hours) at 10/17/2023 0944 Last data filed at 10/17/2023 0500 Gross per 24 hour  Intake 734 ml  Output 2325 ml  Net -1591 ml   Filed Weights   10/15/23 0500 10/16/23 0200 10/17/23 0500  Weight: 74 kg 67.6 kg 70.2 kg     Exam General exam: ill appearing gentleman, not in distress.  Respiratory system: diminished air entry at bases. On 2 lit of [place] oxygen .  Cardiovascular system: S1 & S2 heard, RRR. No JVD,  Gastrointestinal system: Abdomen is nondistended, soft and nontender.  Central nervous system: Alert and oriented to person and place.  Extremities: Symmetric 5 x 5 power. Skin: No rashes,  Psychiatry: Mood & affect appropriate.     Data Reviewed:  I have personally reviewed following labs and imaging studies   CBC Lab Results  Component  Value Date   WBC 18.3 (H) 10/17/2023   RBC 2.68 (L) 10/17/2023   HGB 8.9 (L) 10/17/2023   HCT 28.7 (L) 10/17/2023   MCV 107.1 (H) 10/17/2023   MCH 33.2 10/17/2023   PLT 436 (H) 10/17/2023   MCHC 31.0 10/17/2023   RDW 17.7 (H) 10/17/2023   LYMPHSABS 1.0 10/10/2023   MONOABS 1.6 (H) 10/10/2023   EOSABS 0.3 10/10/2023   BASOSABS 0.1 10/10/2023     Last metabolic panel Lab Results  Component Value Date   NA  137 10/16/2023   K 3.2 (L) 10/16/2023   CL 100 10/16/2023   CO2 28 10/16/2023   BUN 25 (H) 10/16/2023   CREATININE 0.64 10/16/2023   GLUCOSE 106 (H) 10/16/2023   GFRNONAA >60 10/16/2023   GFRAA 102 05/24/2020   CALCIUM 8.7 (L) 10/16/2023   PHOS 3.1 10/17/2023   PROT 6.4 (L) 10/11/2023   ALBUMIN 1.8 (L) 10/11/2023   LABGLOB 2.5 08/12/2023   AGRATIO 1.3 09/15/2022   BILITOT 0.6 10/11/2023   ALKPHOS 71 10/11/2023   AST 12 (L) 10/11/2023   ALT 10 10/11/2023   ANIONGAP 9 10/16/2023    CBG (last 3)  Recent Labs    10/16/23 2345 10/17/23 0325 10/17/23 0746  GLUCAP 104* 96 103*      Coagulation Profile: Recent Labs  Lab 10/10/23 1011  INR 1.1     Radiology Studies: [facility] Chest Port 1 View Result Date: 10/15/2023 CLINICAL DATA:  Respiratory failure. EXAM: PORTABLE CHEST 1 VIEW COMPARISON:  October 11, 2023. FINDINGS: Stable cardiomediastinal silhouette. Feeding tube is seen entering stomach. Left subclavian Port-A-Cath is unchanged. Stable left apical density is noted consistent with mass and radiation fibrosis related to lung cancer. Stable right suprahilar density is noted as well concerning for mass and or fibrosis. Bony thorax is unremarkable. IMPRESSION: Feeding tube is seen entering stomach. Stable bilateral upper lobe opacities are noted as described above consistent with history of bilateral lung cancer. Electronically Signed   By: Lynwood Landy Raddle M.D.   On: 10/15/2023 19:36   DG Abd 1 View Result Date: 10/15/2023 CLINICAL DATA:  Encounter for NG tube placement EXAM: ABDOMEN - 1 VIEW COMPARISON:  Abdomen x-ray October 15, 2023. FINDINGS: Enteric catheters in place with the tip at the level of the pylorus, pulled back to prior. The bowel gas pattern is normal. No radio-opaque calculi or other significant radiographic abnormality are seen. IMPRESSION: Negative.  Enteric catheter in proper position. Electronically Signed   By: Megan  Zare M.D.   On: 10/15/2023 11:47       Elgie Butter M.D. [facility] 10/17/2023, 9:44 AM  Available via Epic secure chat 7am-7pm After 7 pm, please refer to night coverage provider listed on amion.

## 2023-10-17 NOTE — NC FL2 (Signed)
 [place]  MEDICAID FL2 LEVEL OF CARE FORM     IDENTIFICATION  Patient Name: John [name] Birthdate: 1945/12/13 Sex: male Admission Date (Current Location): 10/10/2023  [facility] and [place] Number:  Producer, television/film/video and Address:  [facility],  [address], [place] [address]      Provider Number: [id number]  Attending Physician Name and Address:  Cherlyn Labella, MD  Relative Name and Phone Number:  Hicks,Judy (Significant other)  [phone] (Mobile)    Current Level of Care: Hospital Recommended Level of Care: Skilled Nursing Facility Prior Approval Number:    Date Approved/Denied:   PASRR Number: [id number]  Discharge Plan: SNF    Current Diagnoses: Patient Active Problem List   Diagnosis Date Noted   Malnutrition of moderate degree 10/12/2023   Hypoxic respiratory failure (HCC) 10/10/2023   Respiratory failure (HCC) 10/10/2023   Acute respiratory failure (HCC) 10/10/2023   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) 09/04/2023   Primary squamous cell carcinoma of left lung (HCC) 08/17/2023   Melena 02/19/2023   Symptomatic anemia 02/17/2023   Lethargy 10/10/2021   Heme positive stool    GIB (gastrointestinal bleeding) 09/14/2021   Acute on chronic heart failure with preserved ejection fraction (HFpEF) (HCC) 09/13/2021   Paroxysmal atrial flutter (HCC) 08/25/2021   GERD without esophagitis 08/25/2021   Chronic respiratory failure with hypoxia (HCC) 08/25/2021   Carotid stenosis, right 08/25/2021   Iron  deficiency anemia    Positive colorectal cancer screening using Cologuard test    Angiodysplasia of cecum    Elevated troponin level not due myocardial infarction    History of atrial fibrillation 10/09/2020   Constipation 09/07/2020   Acute gastric ulcer without hemorrhage or perforation    GI bleed 09/05/2020   Atrial flutter (HCC) 09/05/2020   Acute on chronic anemia 09/05/2020   Hypokalemia  05/06/2020   Hypoalbuminemia 05/06/2020   Lactic acidosis 05/06/2020   Tobacco abuse 05/06/2020   Hypoxia    COPD GOLD III/ group D still smoking  10/24/2019   PAD (peripheral artery disease) (HCC) 09/13/2019   Obstructive sleep apnea treated with continuous positive airway pressure (CPAP) 05/31/2018   Diabetic neuropathy (HCC) 12/29/2017   Nicotine  dependence, cigarettes, uncomplicated 12/29/2017   Mixed diabetic hyperlipidemia associated with type 2 diabetes mellitus (HCC) 12/29/2017   Primary insomnia 12/29/2017   Primary osteoarthritis involving multiple joints 12/29/2017   Hypothyroidism 06/09/2017   Joint pain 08/05/2016   Hyponatremia 08/05/2016   Pulmonary nodules 07/16/2016   Chronic diastolic CHF (congestive heart failure) (HCC) 07/09/2016   Diabetes mellitus (HCC) 07/09/2016   Non-small cell lung cancer, right (HCC) 04/18/2016   Hypertension associated with diabetes (HCC) 02/01/2016   COPD (chronic obstructive pulmonary disease) (HCC) 02/01/2016   BPH with urinary obstruction 02/01/2016    Orientation RESPIRATION BLADDER Height & Weight     Self, Time, Situation, Place  Normal Incontinent Weight: 154 lb 12.2 oz (70.2 kg) Height:  5' 6 (167.6 cm)  BEHAVIORAL SYMPTOMS/MOOD NEUROLOGICAL BOWEL NUTRITION STATUS      Incontinent Diet (see DC summary)  AMBULATORY STATUS COMMUNICATION OF NEEDS Skin   Limited Assist Verbally Normal                       Personal Care Assistance Level of Assistance  Bathing, Dressing, Feeding Bathing Assistance: Limited assistance Feeding assistance: Limited assistance Dressing Assistance: Limited assistance     Functional Limitations Info  Sight, Hearing, Speech Sight Info: Adequate Hearing  Info: Adequate Speech Info: Adequate    SPECIAL CARE FACTORS FREQUENCY  PT (By licensed PT), OT (By licensed OT)     PT Frequency: 5x/wk OT Frequency: 5x/wk            Contractures Contractures Info: Not present    Additional  Factors Info  Code Status, Allergies Code Status Info: DNR Allergies Info: Benadryl  (Diphenhydramine )  Bisoprolol   Sulfa  Antibiotics  Atorvastatin  Jardiance  (Empagliflozin )  Lopressor  (Metoprolol )  Rosuvastatin  Doxycycline   Temazepam   Tramadol            Current Medications (10/17/2023):  This is the current hospital active medication list Current Facility-Administered Medications  Medication Dose Route Frequency Provider Last Rate Last Admin    stroke: early stages of recovery book   Does not apply Once Remi Pippin, NP       acetaminophen  (TYLENOL ) tablet 650 mg  650 mg Oral Q4H PRN Akula, Vijaya, MD   650 mg at 10/16/23 1004   arformoterol  (BROVANA ) nebulizer solution 15 mcg  15 mcg Nebulization BID Autry, Lauren E, PA-C   15 mcg at 10/17/23 0748   budesonide  (PULMICORT ) nebulizer solution 0.25 mg  0.25 mg Nebulization BID Autry, Lauren E, PA-C   0.25 mg at 10/17/23 9251   Chlorhexidine  Gluconate Cloth 2 % PADS 6 each  6 each Topical Q2200 Desai, Nikita S, MD   6 each at 10/16/23 2341   clopidogrel  (PLAVIX ) tablet 75 mg  75 mg Per Tube Daily Autry, Lauren E, PA-C   75 mg at 10/17/23 1140   dexmedetomidine  (PRECEDEX ) 400 MCG/100ML (4 mcg/mL) infusion  0-1.2 mcg/kg/hr Intravenous Titrated Babcock, Peter E, NP   Stopped at 10/15/23 1504   docusate (COLACE) 50 MG/5ML liquid 100 mg  100 mg Per Tube BID Mannam, Praveen, MD   100 mg at 10/17/23 1141   docusate sodium  (COLACE) capsule 100 mg  100 mg Oral BID PRN Mannam, Praveen, MD       famotidine  (PEPCID ) tablet 20 mg  20 mg Per Tube BID Mannam, Praveen, MD   20 mg at 10/17/23 1139   feeding supplement (PROSource TF20) liquid 60 mL  60 mL Per Tube BID Cherlyn Labella, MD   60 mL at 10/17/23 1138   feeding supplement (VITAL 1.5 CAL) liquid 1,000 mL  1,000 mL Per Tube Continuous Akula, Vijaya, MD   Stopped at 10/17/23 1030   gabapentin  (NEURONTIN ) 250 MG/5ML solution 300 mg  300 mg Per Tube TID Desai, Nikita S, MD   300 mg at 10/17/23 1139    hydrALAZINE  (APRESOLINE ) injection 5 mg  5 mg Intravenous Q6H PRN Akula, Vijaya, MD   5 mg at 10/16/23 1631   insulin  aspart (novoLOG ) injection 0-20 Units  0-20 Units Subcutaneous Q4H Autry, Lauren E, PA-C   3 Units at 10/17/23 1138   insulin  glargine-yfgn (SEMGLEE ) injection 5 Units  5 Units Subcutaneous BID Babcock, Peter E, NP   5 Units at 10/17/23 1111   levothyroxine  (SYNTHROID ) tablet 50 mcg  50 mcg Per Tube Q0600 Autry, Lauren E, PA-C   50 mcg at 10/17/23 0526   metoprolol  tartrate (LOPRESSOR ) tablet 25 mg  25 mg Per Tube BID Bethene Pleasant K, RPH   25 mg at 10/17/23 1140   Oral care mouth rinse  15 mL Mouth Rinse 4 times per day [name], MD   15 mL at 10/17/23 0800   Oral care mouth rinse  15 mL Mouth Rinse PRN [name], MD  pantoprazole  (PROTONIX ) injection 40 mg  40 mg Intravenous Daily Shade, Christine E, RPH   40 mg at 10/17/23 1105   polyethylene glycol (MIRALAX  / GLYCOLAX ) packet 17 g  17 g Oral Daily PRN Mannam, Praveen, MD       polyethylene glycol (MIRALAX  / GLYCOLAX ) packet 17 g  17 g Per Tube Daily Mannam, Praveen, MD   17 g at 10/17/23 1138   pravastatin  (PRAVACHOL ) tablet 40 mg  40 mg Per Tube Once per day on Monday Wednesday Friday Autry, Lauren E, PA-C   40 mg at 10/16/23 0935   QUEtiapine  (SEROQUEL ) tablet 25 mg  25 mg Per Tube QHS Babcock, Peter E, NP   25 mg at 10/16/23 2340   revefenacin  (YUPELRI ) nebulizer solution 175 mcg  175 mcg Nebulization Daily Autry, Lauren E, PA-C   175 mcg at 10/17/23 0748   sodium chloride  flush (NS) 0.9 % injection 10-40 mL  10-40 mL Intracatheter PRN Mannam, Praveen, MD       spironolactone  (ALDACTONE ) tablet 25 mg  25 mg Per Tube BID Babcock, Peter E, NP   25 mg at 10/17/23 1140     Discharge Medications: Please see discharge summary for a list of discharge medications.  Relevant Imaging Results:  Relevant Lab Results:   Additional Information SSN [id number]  Sheri ONEIDA Sharps, LCSW

## 2023-10-17 NOTE — Progress Notes (Signed)
 Speech Language Pathology Treatment: Dysphagia  Patient Details Name: John [name] MRN: [id number] DOB: 02-23-1946 Today's Date: 10/17/2023 Time: 8564-8542 SLP Time Calculation (min) (ACUTE ONLY): 22 min  Assessment / Plan / Recommendation Clinical Impression  Pt seen for dysphagia tx with intake of upgraded diet via MD including thin liquid via cup sips/jello with small volume/bites and education provided re: respiratory swallowing reciprocity with respiratory breaks prn.  Vocal quality good with one incidence of delayed cough during small trial.  Recommend following general swallow precautions/respiratory precautions with continued clear liquids with objective assessment completed as SLP schedule permits to determine safest diet consistency/strategies needed for continued po consumption.    HPI HPI: 78 year old male admitted to [facility] with respiratory issues-required intubation from July 5 to July 10. Has smallbore feeding tube for nutrition. He has been requiring BiPAP off and on since extubated. Per notes plans were to go home with hospice and critical care service documents patient will be made comfort if he declines. He has past medical history significant for stage IV lung cancer status post chemo and radiation. Patient hospital stay complicated by patient having CVAs. Swallow and speech eval ordered. CT brain showed Known small acute infarcts within the bilateral cerebral hemispheres are occult by CT and were better appreciated on the brain MRI performed earlier today. A thin subdural collection along the right parietal lobe is also occult by CT.  ST evaluated and recommended small sips/ice chips during evaluation.  ST f/u for progression to clear liquids per MD order.      SLP Plan  MBS          Recommendations  Diet recommendations: Thin liquid Liquids provided via: Cup;No straw Medication Administration: Via alternative means Supervision: Staff to assist with self  feeding;Intermittent supervision to cue for compensatory strategies Compensations: Slow rate;Small sips/bites Postural Changes and/or Swallow Maneuvers: Seated upright 90 degrees                  Oral care BID     Dysphagia, unspecified (R13.10)     MBS     Pat Makylah Bossard,M.S.,CCC-SLP  10/17/2023, 3:14 PM

## 2023-10-18 ENCOUNTER — Inpatient Hospital Stay (HOSPITAL_COMMUNITY)

## 2023-10-18 DIAGNOSIS — I5030 Unspecified diastolic (congestive) heart failure: Secondary | ICD-10-CM

## 2023-10-18 DIAGNOSIS — J9601 Acute respiratory failure with hypoxia: Secondary | ICD-10-CM | POA: Diagnosis not present

## 2023-10-18 DIAGNOSIS — I5033 Acute on chronic diastolic (congestive) heart failure: Secondary | ICD-10-CM | POA: Diagnosis not present

## 2023-10-18 DIAGNOSIS — Z7189 Other specified counseling: Secondary | ICD-10-CM

## 2023-10-18 DIAGNOSIS — Z515 Encounter for palliative care: Secondary | ICD-10-CM

## 2023-10-18 DIAGNOSIS — R000 Tachycardia, unspecified: Secondary | ICD-10-CM | POA: Diagnosis not present

## 2023-10-18 LAB — CBC
HCT: 28.5 % — ABNORMAL LOW (ref 39.0–52.0)
Hemoglobin: 8.6 g/dL — ABNORMAL LOW (ref 13.0–17.0)
MCH: 32.5 pg (ref 26.0–34.0)
MCHC: 30.2 g/dL (ref 30.0–36.0)
MCV: 107.5 fL — ABNORMAL HIGH (ref 80.0–100.0)
Platelets: 399 K/uL (ref 150–400)
RBC: 2.65 MIL/uL — ABNORMAL LOW (ref 4.22–5.81)
RDW: 17.1 % — ABNORMAL HIGH (ref 11.5–15.5)
WBC: 18.2 K/uL — ABNORMAL HIGH (ref 4.0–10.5)
nRBC: 0 % (ref 0.0–0.2)

## 2023-10-18 LAB — GLUCOSE, CAPILLARY
Glucose-Capillary: 153 mg/dL — ABNORMAL HIGH (ref 70–99)
Glucose-Capillary: 67 mg/dL — ABNORMAL LOW (ref 70–99)
Glucose-Capillary: 78 mg/dL (ref 70–99)
Glucose-Capillary: 96 mg/dL (ref 70–99)
Glucose-Capillary: 84 mg/dL (ref 70–99)

## 2023-10-18 LAB — BASIC METABOLIC PANEL WITH GFR
Anion gap: 10 (ref 5–15)
BUN: 24 mg/dL — ABNORMAL HIGH (ref 8–23)
CO2: 30 mmol/L (ref 22–32)
Calcium: 8.8 mg/dL — ABNORMAL LOW (ref 8.9–10.3)
Chloride: 99 mmol/L (ref 98–111)
Creatinine, Ser: 0.64 mg/dL (ref 0.61–1.24)
GFR, Estimated: >60 mL/min (ref >60)
Glucose, Bld: 68 mg/dL — ABNORMAL LOW (ref 70–99)
Potassium: 3.3 mmol/L — ABNORMAL LOW (ref 3.5–5.1)
Sodium: 139 mmol/L (ref 135–145)

## 2023-10-18 LAB — TRIGLYCERIDES: Triglycerides: 162 mg/dL — ABNORMAL HIGH (ref <150)

## 2023-10-18 MED ORDER — DEXMEDETOMIDINE HCL IN NACL 200 MCG/50ML IV SOLN
0.0000 ug/kg/h | INTRAVENOUS | Status: DC
  Administered 2023-10-18: 1 ug/kg/h via INTRAVENOUS
  Administered 2023-10-18: 1.2 ug/kg/h via INTRAVENOUS
  Administered 2023-10-18: 0.5 ug/kg/h via INTRAVENOUS
  Filled 2023-10-18 (×3): qty 50

## 2023-10-18 MED ORDER — BIOTENE DRY MOUTH MT LIQD: 15.0000 mL | OROMUCOSAL | Status: DC | PRN

## 2023-10-18 MED ORDER — SODIUM BICARBONATE 650 MG PO TABS
650.0000 mg | ORAL_TABLET | Freq: Once | ORAL | Status: AC
  Administered 2023-10-18: 650 mg
  Filled 2023-10-18: qty 1

## 2023-10-18 MED ORDER — ONDANSETRON 4 MG PO TBDP: 4.0000 mg | ORAL_TABLET | Freq: Four times a day (QID) | ORAL | Status: DC | PRN

## 2023-10-18 MED ORDER — GLYCOPYRROLATE 0.2 MG/ML IJ SOLN: 0.2000 mg | INTRAMUSCULAR | Status: DC | PRN

## 2023-10-18 MED ORDER — MORPHINE 100MG IN NS 100ML (1MG/ML) PREMIX INFUSION
1.0000 mg/h | INTRAVENOUS | Status: DC
  Administered 2023-10-19: 6 mg/h via INTRAVENOUS
  Administered 2023-10-19: 2 mg/h via INTRAVENOUS
  Filled 2023-10-18 (×2): qty 100

## 2023-10-18 MED ORDER — POLYVINYL ALCOHOL 1.4 % OP SOLN
1.0000 [drp] | Freq: Four times a day (QID) | OPHTHALMIC | Status: DC | PRN
Start: 2023-10-18 — End: 2023-10-20

## 2023-10-18 MED ORDER — GLYCOPYRROLATE 1 MG PO TABS: 1.0000 mg | ORAL_TABLET | ORAL | Status: DC | PRN

## 2023-10-18 MED ORDER — HALOPERIDOL LACTATE 5 MG/ML IJ SOLN: 2.0000 mg | Freq: Four times a day (QID) | INTRAMUSCULAR | Status: DC | PRN

## 2023-10-18 MED ORDER — ONDANSETRON HCL 4 MG/2ML IJ SOLN: 4.0000 mg | Freq: Four times a day (QID) | INTRAMUSCULAR | Status: DC | PRN

## 2023-10-18 MED ORDER — PANCRELIPASE (LIP-PROT-AMYL) 10440-39150 UNITS PO TABS
20880.0000 [IU] | ORAL_TABLET | Freq: Once | ORAL | Status: AC
  Administered 2023-10-18: 20880 [IU]
  Filled 2023-10-18: qty 2

## 2023-10-18 MED ORDER — MORPHINE BOLUS VIA INFUSION
1.0000 mg | INTRAVENOUS | Status: DC | PRN
  Administered 2023-10-19: 2 mg via INTRAVENOUS
  Administered 2023-10-19: 3 mg via INTRAVENOUS
  Administered 2023-10-19: 4 mg via INTRAVENOUS
  Administered 2023-10-19: 2 mg via INTRAVENOUS
  Administered 2023-10-19: 3 mg via INTRAVENOUS
  Administered 2023-10-19: 4 mg via INTRAVENOUS

## 2023-10-18 MED ORDER — GLYCOPYRROLATE 0.2 MG/ML IJ SOLN
0.2000 mg | INTRAMUSCULAR | Status: DC | PRN
  Administered 2023-10-19: 0.2 mg via INTRAVENOUS
  Filled 2023-10-18: qty 1

## 2023-10-18 MED ORDER — DEXTROSE 50 % IV SOLN
12.5000 g | INTRAVENOUS | Status: AC
  Administered 2023-10-18: 12.5 g via INTRAVENOUS

## 2023-10-18 MED ORDER — QUETIAPINE FUMARATE 50 MG PO TABS
25.0000 mg | ORAL_TABLET | Freq: Once | ORAL | Status: AC
  Administered 2023-10-18: 25 mg
  Filled 2023-10-18: qty 1

## 2023-10-18 MED ORDER — QUETIAPINE FUMARATE 50 MG PO TABS: 50.0000 mg | ORAL_TABLET | Freq: Every day | ORAL | Status: DC

## 2023-10-18 MED ORDER — QUETIAPINE FUMARATE 50 MG PO TABS: 25.0000 mg | ORAL_TABLET | Freq: Once | ORAL | Status: DC

## 2023-10-18 MED ORDER — SODIUM CHLORIDE 0.9 % IV BOLUS
250.0000 mL | Freq: Once | INTRAVENOUS | Status: AC
  Administered 2023-10-18: 250 mL via INTRAVENOUS

## 2023-10-18 MED ORDER — DEXTROSE 50 % IV SOLN
INTRAVENOUS | Status: AC
  Administered 2023-10-18: 12.5 g via INTRAVENOUS
  Filled 2023-10-18: qty 50

## 2023-10-18 MED ORDER — LORAZEPAM 2 MG/ML IJ SOLN
1.0000 mg | INTRAMUSCULAR | Status: DC | PRN
  Administered 2023-10-19 (×3): 2 mg via INTRAVENOUS
  Filled 2023-10-18 (×3): qty 1

## 2023-10-18 NOTE — Consult Note (Signed)
 Palliative Medicine Inpatient Consult Note  Consulting Provider:  Cherlyn Labella, MD   Reason for consult:   Palliative Care Consult Services Palliative Medicine Consult  Reason for Consult? goals of care   10/18/2023  HPI:  Per intake H&P --> 78 year old with history of adenocarcinoma of the lung and squamous cell carcinoma, COPD, asthma, OSA chronic respiratory failure on home oxygen , a flutter, CHF. Recently admitted to [facility] ER for sepsis, hypoxic respiratory failure secondary to pneumonia, AKI. The Palliative care team has been requested to support additional goals of care conversations.   Clinical Assessment/Goals of Care:  *Please note that this is a verbal dictation therefore any spelling or grammatical errors are due to the Dragon Medical One system interpretation.  I have reviewed medical records including EPIC notes, labs and imaging, received report from bedside RN, assessed the patient who is somnolent and unable to engage .   I called patient's significant other Dagoberto to further discuss diagnosis prognosis, GOC, EOL wishes, disposition and options.   I introduced Palliative Medicine as specialized medical care for people living with serious illness. It focuses on providing relief from the symptoms and stress of a serious illness. The goal is to improve quality of life for both the patient and the family.  Medical History Review and Understanding:  A review of Michio's past medical history significant for lung adenocarcinoma, COPD, respiratory failure, a flutter, heart failure, and diastolic heart failure was completed.  Social History:  Kavi is originally from [place] .  He has lived in [place]  for roughly 40 years.  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  He did this until 2018.  Rodrigues and Dagoberto have been  together for over 25 years.  Yader was married previously 4 times.  Jevonte has 5 children 3 who he actively speaks to and 2 who he does not.  He is a man of faith practicing within the [facility] denomination.  Functional and Nutritional State:  Preceding hospitalization, Max was living in a single-family home with Dagoberto.  He had been getting weaker over time requiring more assistance with dressing.  Alper could walk with a rollator.  He also utilized a cane when he left the house.  Per Dagoberto he rarely went anywhere as she does the majority of tasks within the home as well as outside the home.  Delano has lost about 6 pounds recently in the setting of his lung cancer.  Palliative Symptoms:  Hospital associated delirium-maintain strict delirium precautions.  Provide Seroquel  in the evening.  Adult failure to thrive-was previously taking Megace  which Dagoberto does share improved his appetite overall.  Advance Directives:  A detailed discussion was had today regarding advanced directives.  Dagoberto shares that Keefe does have advanced directives and is safe though she will need to retrieve the key.  She shares in the past she has acted as his Oceanographer though notes they are not legally married.  Code Status:  Concepts specific to code status, artifical feeding and hydration, continued IV antibiotics and rehospitalization was had.  The difference between a aggressive medical intervention path  and a palliative comfort care path for this patient at this time was had.   Confirm that Jahad is a DO NOT RESUSCITATE DO NOT INTUBATE CODE STATUS.  Discussion:  Dagoberto shares with me understanding that Kanon was  hospitalized in the setting of respiratory failure.  Preceding this hospitalization he had been at [facility] in the setting of a pneumonia.  Dagoberto shares that he was anxious to leave although the staff did want to keep him they are longer.  He was discharged on 4 July.  He required intubation  for respiratory failure the fifth of the month until the 10th.   Dagoberto shares from her perspective Remijio was not able to tolerate his last dose of chemotherapy.  She worries that this is something which will not benefit him in the long run given the extent of his cancer.  Dagoberto notes that she was not sure if he would survive from the ICU and understands the significance of his illness.  Dagoberto shares that she had spoken to multiple providers and hospice has been mentioned to her. I described hospice as a service for patients who have a life expectancy of 6 months or less. The goal of hospice is the preservation of dignity and quality at the end phases of life. Under hospice care, the focus changes from curative to symptom relief.   We also discussed the option of comfort care should Jedd's delirium remain profound.We talked about transition to comfort measures in house and what that would entail inclusive of medications to control pain, dyspnea, agitation, nausea, itching, and hiccups.  We discussed stopping all uneccessary measures such as cardiac monitoring, blood draws, needle sticks, and frequent vital signs.   Dagoberto shares she would like some time to consider her options and speak to additional family members.  She expresses that she would like to meet the palliative care team in the oncoming day or 2 for further conversations.  Utilized reflective listening throughout our time together given the difficulty of the situation at hand.   Discussed the importance of continued conversation with family and their  medical providers regarding overall plan of care and treatment options, ensuring decisions are within the context of the patients values and GOCs. ______________________________ Addendum:  I met with patients significant other, Dagoberto, her two sisters, niece, and spoke to patients two children Adine and Yeguada on the phone.   Discussed patients current clinical state and his decline over  the past few months and his lung cancer. Patients family note how stubborn he is and has been. They share that he never listened to the doctos and remained to chain smoke throughout his chemotherapy. He wanted to leave [facility] early which they worry contributed to his deterioration.  We discussed where to go from here.    Patients family all agree a comfort oriented path would be best. Plan to start comfort measures this afternoon after family visits.  Decision Maker: Hicks,Judy (Significant other): [phone] (Mobile)   SUMMARY OF RECOMMENDATIONS   DNAR/DNI  Comfort care  Initiate low dose morphine  gtt after son visits  Wean O2  [facility] to evaluate for inpatient  Unrestricted visitation  The palliative care team will remain involved  Code Status/Advance Care Planning: DNAR/DNI  Palliative Prophylaxis:  Aspiration, Bowel Regimen, Delirium Protocol, Frequent Pain Assessment, Oral Care, Palliative Wound Care, and Turn Reposition  Additional Recommendations (Limitations, Scope, Preferences): Continue current care  Psycho-social/Spiritual:  Desire for further Chaplaincy support: Yes patient is Methodist Additional Recommendations: Discussed and education on chronic disease burden and advanced cancer   Prognosis: Limited to less than 6 months making hospice an appropriate consideration  Discharge Planning: To be determined  Vitals:   10/18/23 0500 10/18/23 0507  BP: (!) 97/48  Pulse: 99 (!) 102  Resp: 20 (!) 22  Temp:    SpO2: (!) 89% 97%    Intake/Output Summary (Last 24 hours) at 10/18/2023 9281 Last data filed at 10/18/2023 0458 Gross per 24 hour  Intake 351.02 ml  Output 2800 ml  Net -2448.98 ml   Last Weight  Most recent update: 10/18/2023  3:41 AM    Weight  64.8 kg (142 lb 13.7 oz)            Gen:  Frail elderly Caucasian M chronically ill appearing HEENT: (+) coretrack, drymucous membranes CV: Irregular rate and rhythm PULM: On 4L nasal  cannula breathing is even and nonlabored ABD: soft/nontender EXT: No edema Neuro: Somnolent  PPS:  20%   This conversation/these recommendations were discussed with patient primary care team, Dr. Cherlyn ______________________________________________________ Rosaline Becton [facility] Palliative Medicine Team Team Cell Phone: [phone] Please utilize secure chat with additional questions, if there is no response within 30 minutes please call the above phone number  Total Time: 120 Billing based on MDM: High   Palliative Medicine Team providers are available by phone from 7am to 7pm daily and can be reached through the team cell phone.  Should this patient require assistance outside of these hours, please call the patient's attending physician.

## 2023-10-18 NOTE — Progress Notes (Signed)
 Hypoglycemic Event  CBG: 67  Treatment: 12.5 D10 IV  Symptoms: drowsiness  Follow-up CBG: Time: 0420 CBG Result: 153  Possible Reasons for Event: TF on hold  Comments/MD notified: Elink made aware    Waddell L Rigby Leonhardt
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
## 2023-10-18 NOTE — TOC Progression Note (Addendum)
 Transition of Care [facility]) - Progression Note    Patient Details  Name: John [name] MRN: [id number] Date of Birth: June 05, 1945  Transition of Care [facility]) CM/SW Contact  Heather DELENA Saltness, LCSW Phone Number: 10/18/2023, 2:15 PM  Clinical Narrative:    Pt and family seeking hospice services with [facility] in [place]. Pt and family also interested in seeking services at [facility]. CSW spoke with on-call admissions RN [phone], who reports admissions team will see pt for evaluation tomorrow. TOC will continue to follow.        Expected Discharge Plan and Services  Home with Hospice services or [facility]     Social Determinants of Health (SDOH) Interventions SDOH Screenings   Food Insecurity: Patient Unable To Answer (10/11/2023)  Housing: Patient Unable To Answer (10/11/2023)  Transportation Needs: Patient Unable To Answer (10/11/2023)  Utilities: Patient Unable To Answer (10/11/2023)  Alcohol  Screen: Low Risk  (05/30/2023)  Depression (PHQ2-9): Low Risk  (10/06/2023)  Financial Resource Strain: Low Risk  (10/07/2023)   Received from [facility]  Physical Activity: Inactive (10/07/2023)   Received from [facility]  Social Connections: Patient Unable To Answer (10/11/2023)  Recent Concern: Social Connections - Moderately Isolated (10/07/2023)   Received [facility]  Stress: Stress Concern Present (10/07/2023)   Received from [facility]  Tobacco Use: High Risk (10/07/2023)   Received from [facility]  Health Literacy: Medium Risk (10/07/2023)   Received from [facility]    Readmission Risk Interventions    10/12/2023    9:40 AM 08/27/2021   11:44 AM  Readmission Risk Prevention Plan  Transportation Screening Complete Complete  PCP or Specialist Appt within 3-5 Days  Complete  HRI or Home Care Consult  Complete  Social Work Consult for Recovery Care Planning/Counseling  Complete  Palliative Care Screening  Not Applicable   Medication Review Oceanographer) Complete Complete  PCP or Specialist appointment within 3-5 days of discharge Complete   HRI or Home Care Consult Complete   SW Recovery Care/Counseling Consult Complete   Palliative Care Screening Not Applicable   Skilled Nursing Facility Not Applicable     Heather Saltness, MSW, LCSW 10/18/2023 2:18 PM

## 2023-10-18 NOTE — Progress Notes (Signed)
 SLP Cancellation Note  Patient Details Name: DEARIS DANIS MRN: [id number] DOB: 1946-03-02   Cancelled treatment:       Reason Eval/Treat Not Completed: Medical issues which prohibited therapy  MBS was attempted.  However, patient was too lethargic to safely participate.  Order cancelled for now.  ST will check for for readiness for MBS.   Eleanor Eagles, MA, CCC-SLP Acute Rehab SLP [phone]  Eleanor LOISE Eagles 10/18/2023, 7:45 AM

## 2023-10-18 NOTE — Progress Notes (Addendum)
  Progress Note  Patient Name: John [name] Date of Encounter: 10/18/2023 [facility] Cardiologist: Darryle ONEIDA Decent, MD   Pt confused this AM   In restraints   COmbative last night   Vital Signs Vitals:   10/18/23 0400 10/18/23 0448 10/18/23 0500 10/18/23 0507  BP: (!) 100/56  (!) 97/48   Pulse: (!) 103 97 99 (!) 102  Resp: 15 20 20  (!) 22  Temp:      TempSrc:      SpO2: 98% 92% (!) 89% 97%  Weight:      Height:        Intake/Output Summary (Last 24 hours) at 10/18/2023 9348 Last data filed at 10/18/2023 0458 Gross per 24 hour  Intake 351.02 ml  Output 2800 ml  Net -2448.98 ml   Net neg 1.5 L         10/18/2023    3:41 AM 10/17/2023    5:00 AM 10/16/2023    2:00 AM  Last 3 Weights  Weight (lbs) 142 lb 13.7 oz 154 lb 12.2 oz 149 lb 0.5 oz  Weight (kg) 64.8 kg 70.2 kg 67.6 kg      Telemetry/ECG  SR/ atrial tach    Heart rates 80s to 120s  Personally Reviewed  Physical Exam  GEN: PT denies CP   IN restraints    Neck: mild JVD Cardiac: RRR, II VI systolic murmur  Respiratory  Clear anteriorly   GI: Soft, nontender, non-distended  MS: No LE  edema  Assessment & Plan  78 y.o. male  with a hx of typical atrial flutter s/p ablation 01/2022, chronic anemia, chronic HFpEF, HTN, HLD, advanced COPD on chronic oxygen , PAD s/p right femoral  endarterectomy on 08/2020 and right common iliac artery stent on 08/2020, non-small cell lung CA s/p chemoradiation therapy in 2018, who is being seen for history of A. Flutter with newly discovered AS   HFpEF    Pt presented with volume overload in setting of COPD exacerbation He has diuresed with Lasix  (dosed intermittentlly  ALso on Spironolactone   and Metolazone   Volume improved     Metolazone  stopped yesterday   Received one dose of IV lasix  yesterday  Prior to admit he wa on 20 bid of demedex     BP is much lower today   HOld spironolactone   Actually give 250 IV NS    Follow     Rhythm   1  Hx of atrial flutter  s/p  ablation  2023     No anticoagulation   2  Hx of atrial tachycardia      Here in hospital he has been in SR and what appears to be atrial tachycardia Metoprolol  started yesterday to slow heart some       AVerage HR about 100     With hypotension would stop  Follow    Moderate to severe AS  Seen on echo this admission   Blood pressure     BP is low today   Would hold diuretic and metoprolol      Would give 250 bolus   Follow   Neuro    Pt confused this AM   Concern with hypotension   Med changes as noted   For questions or updates, please contact [facility] Please consult [email or web address] for contact info under       Signed, Vina Gull, MD

## 2023-10-18 NOTE — Progress Notes (Signed)
 eLink Physician-Brief Progress Note Patient Name: John [name] DOB: 07-26-1945 MRN: [id number]   Date of Service  10/18/2023  HPI/Events of Note  Restless and attempting to get out of bed.  Received seroquel  25 mg at 2200.  eICU Interventions  Soft wrist restraints for safety Seroquel  increased to 50 mg at bedtime with additional 25 mg oral now.     Intervention Category Major Interventions: Delirium, psychosis, severe agitation - evaluation and management  CLAUDENE AGENT, P 10/18/2023, 1:15 AM

## 2023-10-18 NOTE — Progress Notes (Signed)
 CPT on hold due to pt sleeping order Q4 WA.

## 2023-10-18 NOTE — Plan of Care (Signed)
  Problem: Fluid Volume: Goal: Ability to maintain a balanced intake and output will improve Outcome: Progressing   Problem: Metabolic: Goal: Ability to maintain appropriate glucose levels will improve Outcome: Progressing   Problem: Nutritional: Goal: Maintenance of adequate nutrition will improve Outcome: Progressing Goal: Progress toward achieving an optimal weight will improve Outcome: Progressing   Problem: Coping: Goal: Ability to adjust to condition or change in health will improve Outcome: Not Progressing

## 2023-10-18 NOTE — Progress Notes (Signed)
 [facility]                                                                               John [name], is a 78 y.o. male, DOB - 28-May-1945, FMW:[id number] Admit date - 10/10/2023    Outpatient Primary MD for the patient is Lavell, Bari LABOR, FNP  LOS - 8  days    Brief summary   78 year old with history of adenocarcinoma of the lung and squamous cell carcinoma, COPD, asthma, OSA chronic respiratory failure on home oxygen , a flutter, CHF. Recently admitted to [facility] ER for sepsis, hypoxic respiratory failure secondary to pneumonia, AKI. Patient wanted an early discharge and sent home on 7/4 but returned to the emergency room today with generalized weakness, fall overnight. Maintained on BiPAP, noted to have slowly downtrending hemoglobin which is being monitored. Was altered in the ED. Given ceftriaxone , azithromycin , Vanco, Zosyn , LR and intubated. Transferred to ICU for further management.    Patient patient has an history of stage III A adenocarcinoma diagnosed in 2018 treated with radiation, carboplatin , paclitaxel  and durvalumab .  Unfortunately he continued to smoke and PET scan in March 2025 showed recurrence in bilateral upper and lower lobes which was confirmed as stage IV squamous cell cancer stage by bronchoscopic biopsy in April 2025.  He got 1 round of chemotherapy but did not tolerate anymore and was placed on nivolumab  infusion on 7/1.      Assessment & Plan    Assessment and Plan:   Acute on chronic hypoxic, hypercarbic respiratory failure 2/2 COPD exacerbation, likely post-obstructive pneumonia, recurrent lung cancer  with a component of fluid overload Sepsis, POA Completed the course of antibiotics.  Extubated on 7/10. Patient has been on BIPAP PRN.  Continue with pulmicort  , yupelri  and budesonide .  Pt continues to decline despite IV antibiotics and IV lasix . He was more anxious, agitated and confused overnight and was started on precedex  gtt.   Palliative care consulted and after discussions with family, he was transitioned to comfort measures.      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  Neurology recommended aspirin  and plavix .  Cardiology on board.  Palliative care consulted in view of poor progression.    Recurrent stage 4 cancer:  Outpatient follow up with oncology.   Hypophosphatemia Replaced.    Anemia of chronic illness Transfuse to keep it greater than 7.    Acute on Chronic diastolic heart failure:  Prn lasix .  Echo this admission showed LVEF 55-60%, small pericardial effusion Resume spironolactone .   H/o Atrial flutter  S/p ablation s/p ILR.  TELE shows sinus rhythm.    Moderate to Severe AS Monitor.    Hypothyroidism Resume synthroid .    Hyperlipidemia    Type 2 DM CBG (last 3)  Recent Labs    10/18/23 0741 10/18/23 1127 10/18/23 1602  GLUCAP 78 96 84   Tube feeds on hold for now.    Nutrition:  Comfort feeds.    RN Pressure Injury Documentation:  Malnutrition Type:  Nutrition Problem: Moderate Malnutrition Etiology: chronic illness   Malnutrition Characteristics:  Signs/Symptoms: mild fat depletion, moderate muscle depletion   Nutrition Interventions:  Interventions: Refer to RD note for recommendations, Tube feeding  Estimated body mass index is 23.06 kg/m as calculated from the following:   Height as of this encounter: 5' 6 (1.676 m).   Weight as of this encounter: 64.8 kg.  Code Status: DNR DVT Prophylaxis:  comfort measures.    Level of Care: Level of care: ICU Family Communication: none at bedside.   Disposition Plan:     Remains inpatient appropriate:  comfort measures.   Procedures:   7/1 - 10/09/2023 admitted to [facility] 7/5: transferred from [name], ED to [facility], intubated for acute hypoxic respiratory failure 7/6: off pressors  Had rhythmic motions of the legs while sedation is being weaned. EEG 7/7 negative. Ammonia negative. CT 7/8: EEG yesterday negative, but continues to be very altered with weaning attempts. Getting MRI  7/8 echo small pericardial effusion severe AS w/ decreased SV; IVC dilated w/ suggested RAP 15cmHg LVEF 55-60%; 7/9 repeat MRI w/ contrast was neg for acute findings. Neuro feels likely hypoxic injury driving myoclonic activity and delirium. EEG neg for seizure. Did have some leg and arm jerking witnessed felt non-epileptic but noted that myoclonus could have similar appearence  Consultants:   Neurology Cardiology PCCM   Antimicrobials:   Anti-infectives (From admission, onward)    Start     Dose/Rate Route Frequency Ordered Stop   10/17/23 1600  cefTRIAXone  (ROCEPHIN ) 2 g in sodium chloride  0.9 % 100 mL IVPB  Status:  Discontinued        2 g 200 mL/hr over 30 Minutes Intravenous Every 24 hours 10/17/23 1545 10/17/23 1558   10/13/23 1200  cefTRIAXone  (ROCEPHIN ) 2 g in sodium chloride  0.9 % 100 mL IVPB        2 g 200 mL/hr over 30 Minutes Intravenous Daily 10/13/23 1118 10/16/23 1026   10/12/23 0400  vancomycin  (VANCOREADY) IVPB 1250 mg/250 mL  Status:  Discontinued        1,250 mg 166.7 mL/hr over 90 Minutes Intravenous Every 36 hours 10/10/23 1845 10/11/23 1129   10/11/23 1600  vancomycin  (VANCOREADY) IVPB 1500 mg/300 mL        1,500 mg 150 mL/hr over 120 Minutes Intravenous Every 24 hours 10/11/23 1129 10/15/23 1826   10/11/23 1200  ceFEPIme  (MAXIPIME ) 2 g in sodium chloride  0.9 % 100 mL IVPB  Status:  Discontinued        2 g 200 mL/hr over 30 Minutes Intravenous Every 8 hours 10/11/23 1129 10/13/23 1118   10/11/23 0000  ceFEPIme  (MAXIPIME ) 2 g in sodium chloride  0.9 % 100 mL IVPB  Status:  Discontinued        2 g 200 mL/hr over 30 Minutes  Intravenous Every 12 hours 10/10/23 1847 10/11/23 1129   10/10/23 2000  ceFEPIme  (MAXIPIME ) 2 g in sodium chloride  0.9 % 100 mL IVPB  Status:  Discontinued        2 g 200 mL/hr over 30 Minutes Intravenous Every 12 hours 10/10/23 1845 10/10/23 1847   10/10/23 1500  vancomycin  (VANCOCIN ) IVPB 1000 mg/200 mL premix        1,000 mg 200 mL/hr over 60 Minutes Intravenous  Once 10/10/23 1453 10/10/23 1720   10/10/23 1500  piperacillin -tazobactam (ZOSYN ) IVPB 3.375 g        3.375 g 100 mL/hr over 30 Minutes Intravenous  Once 10/10/23 1453 10/10/23 1548   10/10/23 1015  cefTRIAXone  (ROCEPHIN ) 1 g in sodium chloride  0.9 % 100 mL IVPB        1 g 200 mL/hr over 30 Minutes Intravenous  Once 10/10/23 1013 10/10/23 1242   10/10/23 1015  azithromycin  (ZITHROMAX ) 500 mg in sodium chloride  0.9 % 250 mL IVPB  Status:  Discontinued        500 mg 250 mL/hr over 60 Minutes Intravenous Every 24 hours 10/10/23 1013 10/15/23 1131        Medications  Scheduled Meds:   Continuous Infusions:  dexmedetomidine  (PRECEDEX ) IV infusion 0.2 mcg/kg/hr (10/18/23 1851)   morphine      PRN Meds:.acetaminophen , antiseptic oral rinse, artificial tears, glycopyrrolate  **OR** glycopyrrolate  **OR** glycopyrrolate , haloperidol  lactate, LORazepam , morphine , ondansetron  **OR** ondansetron  (ZOFRAN ) IV    Subjective:   John [name] was seen and examined today.  Confused, agitated. Wants to go home. Requesting for his wife.   Objective:   Vitals:   10/18/23 1502 10/18/23 1600 10/18/23 1700 10/18/23 1800  BP: (!) 106/42 (!) 111/39    Pulse: 84 80 79 90  Resp: 19 (!) 23 20 20   Temp:   98.8 F (37.1 C)   TempSrc:   Axillary   SpO2: 94% 94% (!) 89% 99%  Weight:      Height:        Intake/Output Summary (Last 24 hours) at 10/18/2023 1907 Last data filed at 10/18/2023 1851 Gross per 24 hour  Intake 557.67 ml  Output 1050 ml  Net -492.33 ml   Filed Weights   10/16/23 0200 10/17/23 0500 10/18/23 0341  Weight:  67.6 kg 70.2 kg 64.8 kg     Exam General exam: Ill appearing elderly gentleman, restless.  Respiratory system: diminished air entry. On [place] oxygen   Cardiovascular system: S1 & S2 heard, tachycardic.  Gastrointestinal system: Abdomen is soft, bs+ Central nervous system: lethargic, confused.  Extremities: no cyanosis.  Skin: No rashes,  Psychiatry: confused.     Data Reviewed:  I have personally reviewed following labs and imaging studies   CBC Lab Results  Component Value Date   WBC 18.2 (H) 10/18/2023   RBC 2.65 (L) 10/18/2023   HGB 8.6 (L) 10/18/2023   HCT 28.5 (L) 10/18/2023   MCV 107.5 (H) 10/18/2023   MCH 32.5 10/18/2023   PLT 399 10/18/2023   MCHC 30.2 10/18/2023   RDW 17.1 (H) 10/18/2023   LYMPHSABS 1.0 10/10/2023   MONOABS 1.6 (H) 10/10/2023   EOSABS 0.3 10/10/2023   BASOSABS 0.1 10/10/2023     Last metabolic panel Lab Results  Component Value Date   NA 139 10/18/2023   K 3.3 (L) 10/18/2023   CL 99 10/18/2023   CO2 30 10/18/2023   BUN 24 (H) 10/18/2023   CREATININE 0.64 10/18/2023   GLUCOSE 68 (L) 10/18/2023   GFRNONAA >60 10/18/2023   GFRAA 102 05/24/2020   CALCIUM 8.8 (L) 10/18/2023   PHOS 3.1 10/17/2023   PROT 6.4 (L) 10/11/2023   ALBUMIN 1.8 (L) 10/11/2023   LABGLOB 2.5 08/12/2023   AGRATIO 1.3 09/15/2022   BILITOT 0.6 10/11/2023   ALKPHOS 71 10/11/2023   AST 12 (L) 10/11/2023   ALT 10 10/11/2023   ANIONGAP 10 10/18/2023    CBG (last 3)  Recent Labs    10/18/23 0741 10/18/23 1127 10/18/23 1602  GLUCAP 78 96 84      Coagulation Profile: No results for input(s): INR, PROTIME in the last 168 hours.  Radiology Studies: DG CHEST PORT 1 VIEW Result Date: 10/17/2023 CLINICAL DATA:  Cough. EXAM: PORTABLE CHEST 1 VIEW COMPARISON:  Radiograph 10/15/2023, CT 10/10/2023 FINDINGS: Left chest port remains in place. Weighted enteric tube below the diaphragm. Left chest wall loop recorder. Left apical masslike opacity is again seen,  without significant interval change. There is increasing left suprahilar opacity extending from the hilum to the apical mass. Right suprahilar opacity on CT is not well demonstrated on the current exam. No acute right lung opacity. No pleural fluid or pneumothorax. IMPRESSION: 1. Increasing left suprahilar opacity extending from the hilum to the apical mass. This may represent atelectasis or pneumonia, progression of disease is unlikely given development over 2 days. 2. Left apical masslike opacity is again seen. Electronically Signed   By: Andrea Gasman M.D.   On: 10/17/2023 14:20       Elgie Butter M.D. [facility] 10/18/2023, 7:07 PM  Available via Epic secure chat 7am-7pm After 7 pm, please refer to night coverage provider listed on amion.

## 2023-10-18 NOTE — Progress Notes (Signed)
   10/18/23 0300  BiPAP/CPAP/SIPAP  Reason BIPAP/CPAP not in use Non-compliant (PATIENT WAS COMBATIVE FOR THE NURSE AND WAS PUT IN RESTRAINTS. BIPAP IS ON STANDBY)  BiPAP/CPAP /SiPAP Vitals  Pulse Rate (!) 101  Resp 20  SpO2 99 %  MEWS Score/Color  MEWS Score 2  MEWS Score Color Yellow

## 2023-10-19 ENCOUNTER — Telehealth: Payer: Self-pay | Admitting: Family

## 2023-10-19 DIAGNOSIS — I5030 Unspecified diastolic (congestive) heart failure: Secondary | ICD-10-CM | POA: Diagnosis not present

## 2023-10-19 DIAGNOSIS — J9601 Acute respiratory failure with hypoxia: Secondary | ICD-10-CM | POA: Diagnosis not present

## 2023-10-19 DIAGNOSIS — C3490 Malignant neoplasm of unspecified part of unspecified bronchus or lung: Secondary | ICD-10-CM | POA: Diagnosis not present

## 2023-10-19 DIAGNOSIS — J9621 Acute and chronic respiratory failure with hypoxia: Secondary | ICD-10-CM | POA: Diagnosis not present

## 2023-10-19 DIAGNOSIS — Z7189 Other specified counseling: Secondary | ICD-10-CM | POA: Diagnosis not present

## 2023-10-19 DIAGNOSIS — R000 Tachycardia, unspecified: Secondary | ICD-10-CM | POA: Diagnosis not present

## 2023-10-19 DIAGNOSIS — A419 Sepsis, unspecified organism: Secondary | ICD-10-CM | POA: Diagnosis not present

## 2023-10-19 DIAGNOSIS — I5033 Acute on chronic diastolic (congestive) heart failure: Secondary | ICD-10-CM | POA: Diagnosis not present

## 2023-10-19 MED ORDER — DIAZEPAM 5 MG/ML IJ SOLN
2.5000 mg | INTRAMUSCULAR | Status: DC | PRN
  Administered 2023-10-19: 2.5 mg via INTRAVENOUS
  Filled 2023-10-19: qty 2

## 2023-10-19 MED ORDER — SODIUM CHLORIDE 0.9 % IV SOLN: INTRAVENOUS | Status: DC

## 2023-10-19 MED ORDER — HYDROMORPHONE HCL 1 MG/ML IJ SOLN
2.0000 mg | INTRAMUSCULAR | Status: DC | PRN
  Administered 2023-10-19: 2 mg via INTRAVENOUS
  Filled 2023-10-19: qty 2

## 2023-10-19 MED ORDER — DIAZEPAM 5 MG/ML IJ SOLN: 5.0000 mg | INTRAMUSCULAR | Status: DC | PRN

## 2023-10-19 MED ORDER — MORPHINE SULFATE (PF) 2 MG/ML IV SOLN: 1.0000 mg | INTRAVENOUS | Status: DC | PRN

## 2023-10-19 MED ORDER — MORPHINE SULFATE (PF) 2 MG/ML IV SOLN
1.0000 mg | INTRAVENOUS | Status: AC | PRN
  Administered 2023-10-19 (×2): 1 mg via INTRAVENOUS
  Filled 2023-10-19 (×2): qty 1

## 2023-10-19 MED ORDER — ORAL CARE MOUTH RINSE: 15.0000 mL | OROMUCOSAL | Status: DC | PRN

## 2023-10-19 MED ORDER — MORPHINE BOLUS VIA INFUSION
1.0000 mg | INTRAVENOUS | Status: DC | PRN
  Administered 2023-10-19: 5 mg via INTRAVENOUS
  Administered 2023-10-19: 7 mg via INTRAVENOUS
  Administered 2023-10-19: 8 mg via INTRAVENOUS
  Administered 2023-10-19: 5 mg via INTRAVENOUS
  Administered 2023-10-19: 9 mg via INTRAVENOUS
  Administered 2023-10-19: 6 mg via INTRAVENOUS
  Administered 2023-10-19: 4 mg via INTRAVENOUS
  Administered 2023-10-19: 8 mg via INTRAVENOUS
  Administered 2023-10-19: 6 mg via INTRAVENOUS
  Administered 2023-10-19: 5 mg via INTRAVENOUS
  Administered 2023-10-19: 8 mg via INTRAVENOUS

## 2023-10-21 ENCOUNTER — Inpatient Hospital Stay

## 2023-10-21 ENCOUNTER — Inpatient Hospital Stay: Admitting: Hematology

## 2023-10-23 ENCOUNTER — Other Ambulatory Visit: Payer: Medicare Other

## 2023-10-26 ENCOUNTER — Ambulatory Visit: Admitting: Family

## 2023-10-26 NOTE — Progress Notes (Signed)
 Bsx Loop Recorder

## 2023-10-27 ENCOUNTER — Inpatient Hospital Stay: Admitting: Hematology

## 2023-10-27 ENCOUNTER — Inpatient Hospital Stay

## 2023-10-27 NOTE — Telephone Encounter (Signed)
 Spoke with patient's wife, extended our condolences.  Ok to throw remote monitor away.  Marked expired in [employer] and removed from [employer].  Cancelled future appts.   Forwarding to Dr. Cindie as an RICK.

## 2023-10-27 NOTE — Telephone Encounter (Signed)
 Dagoberto called and stated pt pasted 07-Nov-2023.  She has his monitor and needs to know what to do with it  Dagoberto [phone]

## 2023-10-29 NOTE — Progress Notes (Signed)
 Carelink Summary Report / Loop Recorder

## 2023-10-30 ENCOUNTER — Ambulatory Visit: Payer: Medicare Other | Admitting: Urology

## 2023-11-06 NOTE — Progress Notes (Signed)
 Daily Progress Note   Patient Name: John [name]       Date: Nov 12, 2023 DOB: [birth year]  Age: 78 y.o. MRN#: [id number] Attending Physician: Cherlyn Labella, MD Primary Care Physician: Lavell Bari LABOR, FNP Admit Date: 10/10/2023  Reason for Consultation/Follow-up: Establishing goals of care  Patient Profile/HPI: 78 yo male with history of COPD, HTN, HLD, OSA, CHF, DM2, hypothyroid, PAD, GERD, OA, a-fib, NSC lung cancer- adenocarcinoma and squamous cell- was on immunotherapy, recently hospitalized for sepsis related to pnuemonia. Admitted on 7/5 with fall, weakness. Required bipap in ED, eventually required intubation. Was treated for acute on chronic COPD exacerbation. Admission was complicated by new embolic strokes and likely hypoxic injury. Decision was made to transition to full comfort measures only.   Subjective: Chart reviewed including labs, progress notes, imaging from this and previous encounters.  Per LCSW note referral has been sent to [facility] hospice and they are planned to evaluate him today.  Morphine  infusion IV was started this morning. Care discussed with bedside RN Norleen Fila and Dr. Cherlyn. He has required multiple bolus doses of IV morphine  this morning, last night required IV ativan . On eval he was resting comfortably. RN reported some restlessness earlier when oxygen  was titrated down.  HCPOA Judy at bedside. She shared her role in [place] health journey. Emotional support provided.   Review of Systems  Unable to perform ROS: Mental status change     Physical Exam Vitals and nursing note reviewed.  Constitutional:      Appearance: He is ill-appearing.  Cardiovascular:     Rate and Rhythm: Tachycardia present.  Pulmonary:     Comments: Some accessory muscle  useage Skin:    General: Skin is dry.     Coloration: Skin is pale.  Neurological:     Comments: unresponsive             Vital Signs: BP (!) 126/51 (BP Location: Right Arm)   Pulse (!) 110   Temp 98.8 F (37.1 C) (Axillary)   Resp (!) 24   Ht 5' 6 (1.676 m)   Wt 64.8 kg   SpO2 98%   BMI 23.06 kg/m  SpO2: SpO2: 98 % O2 Device: O2 Device: Nasal Cannula O2 Flow Rate: O2 Flow Rate (L/min): 5 L/min  Intake/output summary:  Intake/Output Summary (Last 24 hours)  at 11/18/2023 1311 Last data filed at November 18, 2023 1200 Gross per 24 hour  Intake 446.17 ml  Output 1150 ml  Net -703.83 ml   LBM: Last BM Date : 10/18/23 Baseline Weight: Weight: 69.2 kg Most recent weight: Weight: 64.8 kg       Palliative Assessment/Data: PPS: 10%      Patient Active Problem List   Diagnosis Date Noted   Heart failure with preserved ejection fraction (HCC) 10/18/2023   Malnutrition of moderate degree 10/12/2023   Hypoxic respiratory failure (HCC) 10/10/2023   Respiratory failure (HCC) 10/10/2023   Acute respiratory failure (HCC) 10/10/2023   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) 09/04/2023   Primary squamous cell carcinoma of left lung (HCC) 08/17/2023   Melena 02/19/2023   Symptomatic anemia 02/17/2023   Lethargy 10/10/2021   Heme positive stool    GIB (gastrointestinal bleeding) 09/14/2021   Acute on chronic heart failure with preserved ejection fraction (HFpEF) (HCC) 09/13/2021   Paroxysmal atrial flutter (HCC) 08/25/2021   GERD without esophagitis 08/25/2021   Chronic respiratory failure with hypoxia (HCC) 08/25/2021   Carotid stenosis, right 08/25/2021   Iron  deficiency anemia    Positive colorectal cancer screening using Cologuard test    Angiodysplasia of cecum    Elevated troponin level not due myocardial infarction    History of atrial fibrillation 10/09/2020   Constipation 09/07/2020   Acute gastric ulcer without hemorrhage or  perforation    GI bleed 09/05/2020   Atrial flutter (HCC) 09/05/2020   Acute on chronic anemia 09/05/2020   Hypokalemia 05/06/2020   Hypoalbuminemia 05/06/2020   Lactic acidosis 05/06/2020   Tobacco abuse 05/06/2020   Hypoxia    COPD GOLD III/ group D still smoking  10/24/2019   PAD (peripheral artery disease) (HCC) 09/13/2019   Obstructive sleep apnea treated with continuous positive airway pressure (CPAP) 05/31/2018   Diabetic neuropathy (HCC) 12/29/2017   Nicotine  dependence, cigarettes, uncomplicated 12/29/2017   Mixed diabetic hyperlipidemia associated with type 2 diabetes mellitus (HCC) 12/29/2017   Primary insomnia 12/29/2017   Primary osteoarthritis involving multiple joints 12/29/2017   Hypothyroidism 06/09/2017   Joint pain 08/05/2016   Hyponatremia 08/05/2016   Pulmonary nodules 07/16/2016   Chronic diastolic CHF (congestive heart failure) (HCC) 07/09/2016   Diabetes mellitus (HCC) 07/09/2016   Non-small cell lung cancer, right (HCC) 04/18/2016   Hypertension associated with diabetes (HCC) 02/01/2016   COPD (chronic obstructive pulmonary disease) (HCC) 02/01/2016   BPH with urinary obstruction 02/01/2016    Palliative Care Assessment & Plan    Assessment/Recommendations/Plan  Continue IV morphine  infusion with bolus dosing- titration rate increased to q30 mins as needed, bolus dosing increased to 1-10 mg q15 min prn for better symptom management of air hunger Ok to d/c oxygen  per family's request Waiting on hospice assessment- but I don't think he will be stable enough for transfer after oxygen  is stopped-likely to be hospital death   Code Status:   Code Status: Do not attempt resuscitation (DNR) - Comfort care   Prognosis:  Hours - Days  Discharge Planning: Anticipated Hospital Death  Care plan was discussed with patient's family and care team  Thank you for allowing the Palliative Medicine Team to assist in the care of this patient.  Total time:  60  minutes Prolonged billing:  Time includes:   Preparing to see the patient (e.g., review of tests) Obtaining and/or reviewing separately obtained history Performing a medically necessary appropriate examination and/or evaluation Counseling and educating the  patient/family/caregiver Ordering medications, tests, or procedures Referring and communicating with other health care professionals (when not reported separately) Documenting clinical information in the electronic or other health record Independently interpreting results (not reported separately) and communicating results to the patient/family/caregiver Care coordination (not reported separately) Clinical documentation  Cassondra Stain, AGNP-C Palliative Medicine   Please contact Palliative Medicine Team phone at [phone] for questions and concerns.

## 2023-11-06 NOTE — Telephone Encounter (Signed)
 Patients niece called stating that patient is probably going to pass away soon and his wife is just trying to process it all so she wanted to call for the wife and ask the nurse/pcp what they're supposed to do with patients medications once he has passed.

## 2023-11-06 NOTE — Progress Notes (Signed)
 [facility]                                                                               John [name], is a 78 y.o. male, DOB - Apr 28, 1945, FMW:[id number] Admit date - 10/10/2023    Outpatient Primary MD for the patient is Lavell Bari LABOR, FNP  LOS - 9  days    Brief summary   78 year old with history of adenocarcinoma of the lung and squamous cell carcinoma, COPD, asthma, OSA chronic respiratory failure on home oxygen , a flutter, CHF. Recently admitted to [facility] ER for sepsis, hypoxic respiratory failure secondary to pneumonia, AKI. Patient wanted an early discharge and sent home on 7/4 but returned to the emergency room today with generalized weakness, fall overnight. Maintained on BiPAP, noted to have slowly downtrending hemoglobin which is being monitored. Was altered in the ED. Given ceftriaxone , azithromycin , Vanco, Zosyn , LR and intubated. Transferred to ICU for further management.    Patient patient has an history of stage III A adenocarcinoma diagnosed in 2018 treated with radiation, carboplatin , paclitaxel  and durvalumab .  Unfortunately he continued to smoke and PET scan in March 2025 showed recurrence in bilateral upper and lower lobes which was confirmed as stage IV squamous cell cancer stage by bronchoscopic biopsy in April 2025.  He got 1 round of chemotherapy but did not tolerate anymore and was placed on nivolumab  infusion on 7/1.      Assessment & Plan    Assessment and Plan:   Acute on chronic hypoxic, hypercarbic respiratory failure 2/2 COPD exacerbation, likely post-obstructive pneumonia, recurrent lung cancer  with a component of fluid overload Sepsis, POA Completed the course of antibiotics.  Extubated on 7/10. Patient has been on BIPAP PRN.  Continue with pulmicort  , yupelri  and budesonide .  Pt continues to decline despite IV antibiotics and IV lasix .  Palliative care consulted and after discussions with family, he was transitioned to comfort  measures.  He was started on morphine  gtt.     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  Neurology recommended aspirin  and plavix .  Cardiology on board.  Palliative care consulted in view of poor progression.    Recurrent stage 4 cancer:  Outpatient follow up with oncology.   Hypophosphatemia Replaced.    Anemia of chronic illness Transfuse to keep it greater than 7.    Acute on Chronic diastolic heart failure:  Prn lasix .  Echo this admission showed LVEF 55-60%, small pericardial effusion   H/o Atrial flutter  S/p ablation s/p ILR.  TELE shows sinus rhythm.    Moderate to Severe AS Monitor.    Hypothyroidism Resume synthroid .    Hyperlipidemia    Type 2 DM CBG (last 3)  Recent Labs    10/18/23 0741 10/18/23 1127 10/18/23 1602  GLUCAP 78 96 84   Tube feeds on hold for now.    Nutrition:  Comfort feeds.    RN Pressure Injury Documentation:    Malnutrition Type:  Nutrition Problem: Moderate Malnutrition Etiology:  chronic illness   Malnutrition Characteristics:  Signs/Symptoms: mild fat depletion, moderate muscle depletion   Nutrition Interventions:  Interventions: Refer to RD note for recommendations, Tube feeding  Estimated body mass index is 23.06 kg/m as calculated from the following:   Height as of this encounter: 5' 6 (1.676 m).   Weight as of this encounter: 64.8 kg.  Code Status: DNR DVT Prophylaxis:  comfort measures.    Level of Care: Level of care: ICU Family Communication: none at bedside.   Disposition Plan:     Remains inpatient appropriate:  comfort measures.   Procedures:   7/1 - 10/09/2023 admitted to [facility] 7/5: transferred from [name], ED to [facility], intubated for  acute hypoxic respiratory failure 7/6: off pressors  Had rhythmic motions of the legs while sedation is being weaned. EEG 7/7 negative. Ammonia negative. CT 7/8: EEG yesterday negative, but continues to be very altered with weaning attempts. Getting MRI  7/8 echo small pericardial effusion severe AS w/ decreased SV; IVC dilated w/ suggested RAP 15cmHg LVEF 55-60%; 7/9 repeat MRI w/ contrast was neg for acute findings. Neuro feels likely hypoxic injury driving myoclonic activity and delirium. EEG neg for seizure. Did have some leg and arm jerking witnessed felt non-epileptic but noted that myoclonus could have similar appearence  Consultants:   Neurology Cardiology PCCM   Antimicrobials:   Anti-infectives (From admission, onward)    Start     Dose/Rate Route Frequency Ordered Stop   10/17/23 1600  cefTRIAXone  (ROCEPHIN ) 2 g in sodium chloride  0.9 % 100 mL IVPB  Status:  Discontinued        2 g 200 mL/hr over 30 Minutes Intravenous Every 24 hours 10/17/23 1545 10/17/23 1558   10/13/23 1200  cefTRIAXone  (ROCEPHIN ) 2 g in sodium chloride  0.9 % 100 mL IVPB        2 g 200 mL/hr over 30 Minutes Intravenous Daily 10/13/23 1118 10/16/23 1026   10/12/23 0400  vancomycin  (VANCOREADY) IVPB 1250 mg/250 mL  Status:  Discontinued        1,250 mg 166.7 mL/hr over 90 Minutes Intravenous Every 36 hours 10/10/23 1845 10/11/23 1129   10/11/23 1600  vancomycin  (VANCOREADY) IVPB 1500 mg/300 mL        1,500 mg 150 mL/hr over 120 Minutes Intravenous Every 24 hours 10/11/23 1129 10/15/23 1826   10/11/23 1200  ceFEPIme  (MAXIPIME ) 2 g in sodium chloride  0.9 % 100 mL IVPB  Status:  Discontinued        2 g 200 mL/hr over 30 Minutes Intravenous Every 8 hours 10/11/23 1129 10/13/23 1118   10/11/23 0000  ceFEPIme  (MAXIPIME ) 2 g in sodium chloride  0.9 % 100 mL IVPB  Status:  Discontinued        2 g 200 mL/hr over 30 Minutes Intravenous Every 12 hours 10/10/23 1847 10/11/23 1129   10/10/23 2000  ceFEPIme  (MAXIPIME )  2 g in sodium chloride  0.9 % 100 mL IVPB  Status:  Discontinued        2 g 200 mL/hr over 30 Minutes Intravenous Every 12 hours 10/10/23 1845 10/10/23 1847   10/10/23 1500  vancomycin  (VANCOCIN ) IVPB 1000 mg/200 mL premix        1,000 mg 200 mL/hr over 60 Minutes Intravenous  Once 10/10/23 1453 10/10/23 1720   10/10/23 1500  piperacillin -tazobactam (ZOSYN ) IVPB 3.375 g        3.375 g 100 mL/hr over 30 Minutes Intravenous  Once 10/10/23 1453 10/10/23 1548  10/10/23 1015  cefTRIAXone  (ROCEPHIN ) 1 g in sodium chloride  0.9 % 100 mL IVPB        1 g 200 mL/hr over 30 Minutes Intravenous  Once 10/10/23 1013 10/10/23 1242   10/10/23 1015  azithromycin  (ZITHROMAX ) 500 mg in sodium chloride  0.9 % 250 mL IVPB  Status:  Discontinued        500 mg 250 mL/hr over 60 Minutes Intravenous Every 24 hours 10/10/23 1013 10/15/23 1131        Medications  Scheduled Meds:   Continuous Infusions:  dexmedetomidine  (PRECEDEX ) IV infusion Stopped (2023/11/01 0030)   morphine  2 mg/hr (Aug 30, 2023 0813)   PRN Meds:.acetaminophen , antiseptic oral rinse, artificial tears, glycopyrrolate  **OR** glycopyrrolate  **OR** glycopyrrolate , haloperidol  lactate, LORazepam , morphine , ondansetron  **OR** ondansetron  (ZOFRAN ) IV, mouth rinse    Subjective:   Keiston Manley was seen and examined today.  Restless. Objective:   Vitals:   01-Nov-2023 0300 11/01/2023 0400 11-01-23 0500 2023-11-01 0600  BP:  (!) 126/51    Pulse: (!) 113 (!) 110 (!) 108 (!) 110  Resp: 18 18 (!) 23 (!) 24  Temp:      TempSrc:      SpO2: 99% 96% 98% 98%  Weight:      Height:        Intake/Output Summary (Last 24 hours) at Nov 01, 2023 0857 Last data filed at 01-Nov-2023 0630 Gross per 24 hour  Intake 425.55 ml  Output 1150 ml  Net -724.45 ml   Filed Weights   10/16/23 0200 10/17/23 0500 10/18/23 0341  Weight: 67.6 kg 70.2 kg 64.8 kg     Exam General exam: Appears calm and comfortable  Respiratory system: Clear to auscultation. Respiratory  effort normal. Cardiovascular system: S1 & S2 heard, tachycardic. Gastrointestinal system: Abdomen is soft bs+ Central nervous system: not following commands.    Data Reviewed:  I have personally reviewed following labs and imaging studies   CBC Lab Results  Component Value Date   WBC 18.2 (H) 10/18/2023   RBC 2.65 (L) 10/18/2023   HGB 8.6 (L) 10/18/2023   HCT 28.5 (L) 10/18/2023   MCV 107.5 (H) 10/18/2023   MCH 32.5 10/18/2023   PLT 399 10/18/2023   MCHC 30.2 10/18/2023   RDW 17.1 (H) 10/18/2023   LYMPHSABS 1.0 10/10/2023   MONOABS 1.6 (H) 10/10/2023   EOSABS 0.3 10/10/2023   BASOSABS 0.1 10/10/2023     Last metabolic panel Lab Results  Component Value Date   NA 139 10/18/2023   K 3.3 (L) 10/18/2023   CL 99 10/18/2023   CO2 30 10/18/2023   BUN 24 (H) 10/18/2023   CREATININE 0.64 10/18/2023   GLUCOSE 68 (L) 10/18/2023   GFRNONAA >60 10/18/2023   GFRAA 102 05/24/2020   CALCIUM 8.8 (L) 10/18/2023   PHOS 3.1 10/17/2023   PROT 6.4 (L) 10/11/2023   ALBUMIN 1.8 (L) 10/11/2023   LABGLOB 2.5 08/12/2023   AGRATIO 1.3 09/15/2022   BILITOT 0.6 10/11/2023   ALKPHOS 71 10/11/2023   AST 12 (L) 10/11/2023   ALT 10 10/11/2023   ANIONGAP 10 10/18/2023    CBG (last 3)  Recent Labs    10/18/23 0741 10/18/23 1127 10/18/23 1602  GLUCAP 78 96 84      Coagulation Profile: No results for input(s): INR, PROTIME in the last 168 hours.    Radiology Studies: DG CHEST PORT 1 VIEW Result Date: 10/17/2023 CLINICAL DATA:  Cough. EXAM: PORTABLE CHEST 1 VIEW COMPARISON:  Radiograph 10/15/2023, CT 10/10/2023 FINDINGS: Left chest port  remains in place. Weighted enteric tube below the diaphragm. Left chest wall loop recorder. Left apical masslike opacity is again seen, without significant interval change. There is increasing left suprahilar opacity extending from the hilum to the apical mass. Right suprahilar opacity on CT is not well demonstrated on the current exam. No acute  right lung opacity. No pleural fluid or pneumothorax. IMPRESSION: 1. Increasing left suprahilar opacity extending from the hilum to the apical mass. This may represent atelectasis or pneumonia, progression of disease is unlikely given development over 2 days. 2. Left apical masslike opacity is again seen. Electronically Signed   By: Andrea Gasman M.D.   On: 10/17/2023 14:20       Elgie Butter M.D. [facility] 02-Nov-2023, 8:57 AM  Available via Epic secure chat 7am-7pm After 7 pm, please refer to night coverage provider listed on amion.

## 2023-11-06 NOTE — Telephone Encounter (Signed)
 LEFT MESSAGE TO CALL BACK

## 2023-11-06 NOTE — Plan of Care (Signed)
  Problem: Skin Integrity: Goal: Risk for impaired skin integrity will decrease Outcome: Progressing   Problem: Coping: Goal: Will identify appropriate support needs Outcome: Progressing   Problem: Health Behavior/Discharge Planning: Goal: Goals will be collaboratively established with patient/family Outcome: Progressing   Problem: Coping: Goal: Ability to adjust to condition or change in health will improve Outcome: Not Progressing   Problem: Fluid Volume: Goal: Ability to maintain a balanced intake and output will improve Outcome: Not Progressing   Problem: Nutritional: Goal: Maintenance of adequate nutrition will improve Outcome: Not Progressing   Problem: Education: Goal: Knowledge of disease or condition will improve Outcome: Not Progressing

## 2023-11-06 NOTE — Accreditation Note (Signed)
 Death within 24 hours of discontinuation of bilateral soft wrist restraints logged on [id number] at 1353 by Valentin Lai RN

## 2023-11-06 NOTE — Progress Notes (Signed)
   2023-11-01 1105  Spiritual Encounters  Type of Visit Initial  Care provided to: Significant other  Referral source Chaplain assessment  Reason for visit End-of-life   I sought to offer support to patient and family based on comfort care status.  John [name] was resting comfortably. I spoke with his significant other of 19yrs at bedside. She processed with me [name]'s history of illness and, notably, the passing of his only sibling, his sister, on 7/16 last year. Will continue to follow and offer support as needed.  Helaina Stefano L. Fredrica, M.Div [phone]

## 2023-11-06 NOTE — Progress Notes (Signed)
 Nutrition Brief Note  Chart reviewed. Pt now transitioning to comfort care.  No further nutrition interventions planned at this time.  Please re-consult as needed.   Shelle Iron RD, LDN Contact via [employer].

## 2023-11-06 NOTE — Progress Notes (Signed)
 Due to this pt being admitted as a result of a fall, Patient Placement called to request that he be ruled out as a Medical Examiner's case. Medical examiner was called and John [name] confirmed that he is not eligible to be a Medical Examiner's case due to the lack of extensive injuries and the presence of extensive medical issues leading to his fall at home.

## 2023-11-06 NOTE — Telephone Encounter (Signed)
 Oh I am so sorry to hear this. They can take his medications to any pharmacy to dispose of.

## 2023-11-06 NOTE — Death Summary Note (Signed)
 DEATH SUMMARY   Patient Details  Name: John [name] MRN: [id number] DOB: 1945-11-24 ERE:Yjtxd, Bari LABOR, FNP Admission/Discharge Information   Admit Date:  11-08-2023  Date of Death: Date of Death: 2023-11-17  Time of Death: Time of Death: 11-24-32  Length of Stay: 10   Principle Cause of death: lung cancer.   Hospital Diagnoses: Principal Problem:   Hypoxic respiratory failure (HCC) Active Problems:   Respiratory failure (HCC)   Acute respiratory failure (HCC)   Malnutrition of moderate degree   Heart failure with preserved ejection fraction [facility])   Hospital Course: 78 year old with history of adenocarcinoma of the lung and squamous cell carcinoma, COPD, asthma, OSA chronic respiratory failure on home oxygen , a flutter, CHF. Recently admitted to [facility] ER for sepsis, hypoxic respiratory failure secondary to pneumonia, AKI. Patient wanted an early discharge and sent home on 7/4 but returned to the emergency room today with generalized weakness, fall overnight. Maintained on BiPAP, noted to have slowly downtrending hemoglobin which is being monitored. Was altered in the ED. Given ceftriaxone , azithromycin , Vanco, Zosyn , LR and intubated. Transferred to ICU for further management.      Patient patient has an history of stage III A adenocarcinoma diagnosed in 24-Nov-2016 treated with radiation, carboplatin , paclitaxel  and durvalumab .  Unfortunately he continued to smoke and PET scan in March 2025 showed recurrence in bilateral upper and lower lobes which was confirmed as stage IV squamous cell cancer stage by bronchoscopic biopsy in April 2025.  He got 1 round of chemotherapy but did not tolerate anymore and was placed on nivolumab  infusion on 7/1.  Assessment and Plan:  Acute on chronic hypoxic, hypercarbic respiratory failure 2/2 COPD exacerbation, likely post-obstructive pneumonia, recurrent lung cancer  with a component of fluid overload Sepsis, POA Completed the course of antibiotics.   Extubated on 7/10. Patient has been on BIPAP PRN.  Continue with pulmicort  , yupelri  and budesonide .  Pt continues to decline despite IV antibiotics and IV lasix .  Palliative care consulted and after discussions with family, he was transitioned to comfort measures.  He was started on morphine  gtt and he passes at 21:34 .         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   Palliative care consulted in view of his progressive decline.  He was transitioned to comfort measures.      Recurrent stage 4 cancer:  Outpatient follow up with oncology.    Hypophosphatemia Replaced.      Anemia of chronic illness Transfuse to keep it greater than 7.      Acute on Chronic diastolic heart failure:  Prn lasix .  Echo this admission showed LVEF 55-60%, small pericardial effusion     H/o Atrial flutter  S/p ablation s/p ILR.  TELE shows sinus rhythm.      Moderate to Severe AS Monitor.      Hypothyroidism      Hyperlipidemia       RN Pressure Injury Documentation:   Malnutrition Type:   Nutrition Problem: Moderate Malnutrition Etiology: chronic illness     Malnutrition Characteristics:   Signs/Symptoms: mild fat depletion, moderate muscle depletion     Nutrition Interventions:   Interventions: Refer to RD note for recommendations, Tube feeding   Estimated body mass index is 23.06 kg/m as calculated from the following:  Height as of this encounter: 5' 6 (1.676 m).   Weight as of this encounter: 64.8 kg.          Procedures: none.   Consultations: cardiology PCCM. Palliative care   The results of significant diagnostics from this hospitalization (including imaging, microbiology, ancillary and laboratory) are listed below for reference.    Significant Diagnostic Studies: DG CHEST PORT 1 VIEW Result Date: 10/17/2023 CLINICAL DATA:  Cough. EXAM: PORTABLE CHEST 1 VIEW COMPARISON:  Radiograph 10/15/2023, CT 10/10/2023 FINDINGS: Left chest port remains in place. Weighted enteric tube below the diaphragm. Left chest wall loop recorder. Left apical masslike opacity is again seen, without significant interval change. There is increasing left suprahilar opacity extending from the hilum to the apical mass. Right suprahilar opacity on CT is not well demonstrated on the current exam. No acute right lung opacity. No pleural fluid or pneumothorax. IMPRESSION: 1. Increasing left suprahilar opacity extending from the hilum to the apical mass. This may represent atelectasis or pneumonia, progression of disease is unlikely given development over 2 days. 2. Left apical masslike opacity is again seen. Electronically Signed   By: Andrea Gasman M.D.   On: 10/17/2023 14:20   DG Chest Port 1 View Result Date: 10/15/2023 CLINICAL DATA:  Respiratory failure. EXAM: PORTABLE CHEST 1 VIEW COMPARISON:  October 11, 2023. FINDINGS: Stable cardiomediastinal silhouette. Feeding tube is seen entering stomach. Left subclavian Port-A-Cath is unchanged. Stable left apical density is noted consistent with mass and radiation fibrosis related to lung cancer. Stable right suprahilar density is noted as well concerning for mass and or fibrosis. Bony thorax is unremarkable. IMPRESSION: Feeding tube is seen entering stomach. Stable bilateral upper lobe opacities are noted as described above consistent with history of bilateral lung cancer. Electronically Signed   By: Lynwood Landy Raddle M.D.   On: 10/15/2023 19:36   DG Abd 1 View Result Date: 10/15/2023 CLINICAL DATA:  Encounter for NG tube placement EXAM: ABDOMEN - 1 VIEW COMPARISON:  Abdomen x-ray October 15, 2023. FINDINGS: Enteric catheters in place with the tip at the level of the pylorus, pulled back to prior. The bowel gas pattern is  normal. No radio-opaque calculi or other significant radiographic abnormality are seen. IMPRESSION: Negative.  Enteric catheter in proper position. Electronically Signed   By: Megan  Zare M.D.   On: 10/15/2023 11:47   DG Abd 1 View Result Date: 10/15/2023 CLINICAL DATA:  Enteric tube placement EXAM: ABDOMEN - 1 VIEW COMPARISON:  Abdominal radiograph dated 10/07/2022 FINDINGS: Post pyloric feeding tube tip projects over the distal duodenum. Additional enteric catheter tip projects over the stomach. Partially imaged central venous catheter tip projects over the lower SVC. Implanted loop recorder is seen projecting over the medial left hemithorax. Nonobstructive partially imaged bowel gas pattern. No free air or pneumatosis. IMPRESSION: 1. Post pyloric feeding tube tip projects over the distal duodenum. 2. Additional enteric catheter tip projects over the stomach. Electronically Signed   By: Limin  Xu M.D.   On: 10/15/2023 10:15   EEG adult Result Date: 10/14/2023 Shelton Arlin KIDD, MD     10/14/2023  4:57 PM Patient Name: SHAUNN TACKITT MRN: [id number] Epilepsy Attending: Arlin KIDD Shelton Referring Physician/Provider: Jerrie Lola CROME, MD Date: 10/14/2023 Duration: 30.17 mins Patient history:  78yo M with ams. EEG to evaluate for seizure  Level of alertness: awake  AEDs during EEG study: GBP  Technical aspects: This EEG was obtained using a 10 lead EEG system positioned circumferentially without any parasagittal coverage (  rapid EEG). Computer selected EEG is reviewed as  well as background features and all clinically significant events.  Description: EEG showed continuous generalized 3 to 7 Hz theta-delta slowing, at times with triphasic morphology. Hyperventilation and photic stimulation were not performed.   Patient was noted to have brief leg jerk at 1615 and left arm jerking at 1618. Concomitant eeg before, during and after the event didn't show eeg change to suggest seizure.  ABNORMALITY - Continuous slow,  generalized  IMPRESSION: This EEG is suggestive of moderate diffuse encephalopathy.  No seizures or definite epileptiform discharges were seen throughout the recording. Patient was noted to have brief leg jerk at 1615 and left arm jerking at 1618 without concomitant eeg change. These events were most likely NON epileptic. Myoclonus can have similar appearance.  Arlin MALVA Krebs   MR BRAIN W CONTRAST Result Date: 10/14/2023 EXAM: MRI BRAIN WITH CONTRAST 10/14/2023 06:42:18 AM TECHNIQUE: Multiplanar multisequence MRI of the head/brain was performed with the administration of intravenous contrast. COMPARISON: Noncontrast MR head without contrast 10/13/2023 and CT angio head and neck 10/13/2023. CLINICAL HISTORY: Metastatic disease evaluation. Personal history of lung cancer. FINDINGS: BRAIN AND VENTRICLES: No acute infarct. No acute intracranial hemorrhage. No mass effect or midline shift. No hydrocephalus. The sella is unremarkable. Normal flow voids. No mass or abnormal enhancement. No pathologic enhancement is present. Specifically, the foci of restricted diffusion demonstrated yesterday I do not enhance. ORBITS: No acute abnormality. SINUSES: No acute abnormality. BONES AND SOFT TISSUES: Normal bone marrow signal and enhancement. No acute soft tissue abnormality. IMPRESSION: 1. No evidence of metastatic disease or other pathologic enhancement. Electronically signed by: Lonni Necessary MD 10/14/2023 06:50 AM EDT RP Workstation: [id number]   CT ANGIO HEAD NECK W WO CM Addendum Date: 10/13/2023 ADDENDUM REPORT: 10/13/2023 17:57 ADDENDUM: CT head impression #2 called by telephone on 10/13/2023 at 5:57 pm to provider DEVON Dr. Meade, Who verbally acknowledged these results. Electronically Signed   By: Rockey Childs D.O.   On: 10/13/2023 17:57   Result Date: 10/13/2023 CLINICAL DATA:  Provided history: Stroke/TIA, determine embolic source. EXAM: CT ANGIOGRAPHY HEAD AND NECK WITH AND WITHOUT CONTRAST TECHNIQUE:  Multidetector CT imaging of the head and neck was performed using the standard protocol during bolus administration of intravenous contrast. Multiplanar CT image reconstructions and MIPs were obtained to evaluate the vascular anatomy. Carotid stenosis measurements (when applicable) are obtained utilizing NASCET criteria, using the distal internal carotid diameter as the denominator. RADIATION DOSE REDUCTION: This exam was performed according to the departmental dose-optimization program which includes automated exposure control, adjustment of the mA and/or kV according to patient size and/or use of iterative reconstruction technique. CONTRAST:  75mL OMNIPAQUE  IOHEXOL  350 MG/ML SOLN COMPARISON:  Brain MRI 10/13/2023. Head CT 10/10/2023. Chest CT 10/10/2023. FINDINGS: CT HEAD FINDINGS Brain: Generalized cerebral atrophy. Known small acute infarcts within the bilateral cerebral hemispheres are occult by CT and were better appreciated on the brain MRI performed earlier today. A thin subdural collection along the right parietal lobe is also occult by CT and was better appreciated on the brain MRI performed earlier today (measuring 3 mm in thickness on the prior exam). Patchy and ill-defined hypoattenuation within the cerebral white matter, nonspecific but compatible with moderate chronic small vessel ischemic disease. There is no acute intracranial hemorrhage. No extra-axial fluid collection. No evidence of an intracranial mass. No midline shift. Vascular: No hyperdense vessel. Atherosclerotic calcifications. Skull: No calvarial fracture or aggressive osseous lesion. Sinuses/Orbits: No orbital mass or acute orbital  finding. No significant paranasal sinus disease. Review of the MIP images confirms the above findings CTA NECK FINDINGS Aortic arch: Standard aortic branching. Atherosclerotic plaque within visible portions of the thoracic aorta and within proximal major branch vessels of the neck. No hemodynamically  significant innominate or proximal subclavian artery stenosis. Right carotid system: CCA and ICA patent within the neck. Atherosclerotic plaque. Most notably, there is prominent predominantly calcified plaque within the proximal ICA resulting in a severe (estimated greater than 80%) stenosis. Left carotid system: CCA and ICA patent within the neck. Atherosclerotic plaque. Most notably, moderate predominately calcified plaque within the proximal ICA results in 30-40% stenosis. Vertebral arteries: Codominant and patent within the neck. Mild sclerotic narrowing at the left vertebral artery origin. Skeleton: The patient is edentulous. Spondylosis at the cervical and visualized upper thoracic levels. Other neck: No neck mass or cervical lymphadenopathy. Upper chest: The ET tube terminates within the distal trachea (just above the carina). Partially visualized enteric tube. Otherwise, no significant interval change at the imaged levels as compared to the recent prior chest CT of 10/10/2023. The patient has a known history of lung cancer. Please refer to this prior examination for a complete description. Review of the MIP images confirms the above findings CTA HEAD FINDINGS Anterior circulation: The intracranial internal carotid arteries are patent. Atherosclerotic plaque bilaterally. Mostly, there is up to moderate stenosis of the right paraclinoid segment. The M1 middle cerebral arteries are patent. No M2 proximal branch occlusion or high-grade proximal stenosis. The anterior cerebral arteries are patent. No intracranial aneurysm is identified. Posterior circulation: The intracranial vertebral arteries are patent. The basilar artery is patent. The posterior cerebral arteries are patent. A right posterior communicating artery is present. The left posterior communicating artery is diminutive or absent. Venous sinuses: Within the limitations of contrast timing, no convincing thrombus. Anatomic variants: As described. Review  of the MIP images confirms the above findings Attempts are being made to reach the ordering provider regarding head CT impression #2 at this time. IMPRESSION: Non-contrast head CT: 1. Known small acute infarcts within the bilateral cerebral hemispheres are occult by CT and were better appreciated on the brain MRI performed earlier today. 2. A thin subdural collection (possible subdural hematoma) along the right parietal lobe is also occult by CT and was better appreciated on the brain MRI performed earlier today (measuring 3 mm in thickness on the prior exam). 3. Background parenchymal atrophy and chronic small vessel ischemic disease. CTA neck: 1. The common carotid and internal carotid arteries are patent within the neck. Atherosclerotic plaque bilaterally, most notably as follows. Severe (estimated greater than 80%) stenosis of the proximal right ICA. 30-40% stenosis of the proximal left ICA. 2. Vertebral arteries patent within the neck. Mild atherosclerotic narrowing at the left vertebral artery origin. 3. Aortic Atherosclerosis (ICD10-I70.0). CTA head: 1. No proximal intracranial large vessel occlusion is identified. 2. Atherosclerotic plaque within the intracranial internal carotid arteries. Most notably, there is up to moderate stenosis of the paraclinoid right ICA. Electronically Signed: By: Rockey Childs D.O. On: 10/13/2023 17:47   MR BRAIN WO CONTRAST Result Date: 10/13/2023 CLINICAL DATA:  Altered mental status EXAM: MRI HEAD WITHOUT CONTRAST TECHNIQUE: Multiplanar, multiecho pulse sequences of the brain and surrounding structures were obtained without intravenous contrast. COMPARISON:  July 22, 2023 FINDINGS: Brain: There several punctate acute infarcts. There are 3 present in the right frontal lobe, 1 in the right occipital lobe, 1 in the posteromedial left frontal lobe and 1 in the left  parietal lobe. There are multiple additional foci of T2 hyperintensity in the cerebral white matter both  hemispheres without restriction of diffusion. The ventricles are normal. Vascular: There are normal flow signal is present in the carotid arteries and basilar artery. Skull and upper cervical spine: No significant abnormality Sinuses/Orbits: No significant abnormality IMPRESSION: There are several punctate acute infarcts involving both hemispheres, 3 in the right frontal lobe, 1 in the right occipital lobe, 1 in the posteromedial left frontal lobe and 1 in the left parietal lobe. These results will be called to the ordering clinician or representative by the Radiologist Assistant, and communication documented in the PACS or [employer]. Electronically Signed   By: Nancyann Burns M.D.   On: 10/13/2023 12:28   ECHOCARDIOGRAM COMPLETE Result Date: 10/13/2023    ECHOCARDIOGRAM REPORT   Patient Name:   MARUICE PIERONI Date of Exam: 10/13/2023 Medical Rec #:  [id number]        Height:       66.0 in Accession #:    [id number]       Weight:       165.3 lb Date of Birth:  December 17, 1945        BSA:          1.844 m Patient Age:    77 years         BP:           136/51 mmHg Patient Gender: M                HR:           78 bpm. Exam Location:  Inpatient Procedure: 2D Echo, Cardiac Doppler and Color Doppler (Both Spectral and Color            Flow Doppler were utilized during procedure). Indications:    Murmur R01.1  History:        Patient has prior history of Echocardiogram examinations, most                 recent 09/15/2021.  Sonographer:    Tinnie Gosling RDCS Referring Phys: [id number] TINNIE FORBES FURTH IMPRESSIONS  1. Left ventricular ejection fraction, by estimation, is 55 to 60%. The left ventricle has normal function. Left ventricular endocardial border not optimally defined to evaluate regional wall motion. Left ventricular diastolic function could not be evaluated.  2. Right ventricular systolic function is normal. The right ventricular size is mildly enlarged.  3. A small pericardial effusion is present. The pericardial  effusion is surrounding the apex.  4. The mitral valve is degenerative. No evidence of mitral valve regurgitation. Moderate to severe mitral annular calcification.  5. This is a technically difficult study: DVI 0.25 and limited valve motion consistent with severe aortic stenosis with decreased stroke volume index; mean gradient 15 mm Hg. Off axis images. . The aortic valve was not well visualized. Aortic valve regurgitation is trivial. Moderate to severe aortic valve stenosis.  6. The inferior vena cava is dilated in size with <50% respiratory variability, suggesting right atrial pressure of 15 mmHg. Comparison(s): Prior images reviewed side by side. Marginal increase in aortic valve gradients, stroke volume index diminished in this study. There is some degree of progression of aortic stenosis; this study is technically difficult compared to prior. FINDINGS  Left Ventricle: Left ventricular ejection fraction, by estimation, is 55 to 60%. The left ventricle has normal function. Left ventricular endocardial border not optimally defined to evaluate regional wall motion. The left ventricular internal cavity  size was normal in size. There is no left ventricular hypertrophy. Left ventricular diastolic function could not be evaluated due to mitral annular calcification (moderate or greater). Left ventricular diastolic function could not be evaluated. Right Ventricle: The right ventricular size is mildly enlarged. No increase in right ventricular wall thickness. Right ventricular systolic function is normal. Left Atrium: Left atrial size was normal in size. Right Atrium: Right atrial size was normal in size. Pericardium: A small pericardial effusion is present. The pericardial effusion is surrounding the apex. Mitral Valve: The mitral valve is degenerative in appearance. Moderate to severe mitral annular calcification. No evidence of mitral valve regurgitation. Tricuspid Valve: The tricuspid valve is not well visualized.  Tricuspid valve regurgitation is not demonstrated. Aortic Valve: This is a technically difficult study: DVI 0.25 and limited valve motion consistent with severe aortic stenosis with decreased stroke volume index; mean gradient 15 mm Hg. Off axis images. The aortic valve was not well visualized. Aortic valve regurgitation is trivial. Moderate to severe aortic stenosis is present. Aortic valve mean gradient measures 15.0 mmHg. Aortic valve peak gradient measures 26.8 mmHg. Aortic valve area, by VTI measures 0.77 cm. Pulmonic Valve: The pulmonic valve was not well visualized. Pulmonic valve regurgitation is not visualized. Aorta: The aortic root and ascending aorta are structurally normal, with no evidence of dilitation. Venous: The inferior vena cava is dilated in size with less than 50% respiratory variability, suggesting right atrial pressure of 15 mmHg. IAS/Shunts: The interatrial septum was not well visualized.  LEFT VENTRICLE PLAX 2D LVIDd:         4.40 cm   Diastology LVIDs:         3.10 cm   LV e' medial:    6.20 cm/s LV PW:         0.80 cm   LV E/e' medial:  20.3 LV IVS:        0.80 cm   LV e' lateral:   6.64 cm/s LVOT diam:     2.00 cm   LV E/e' lateral: 19.0 LV SV:         39 LV SV Index:   21 LVOT Area:     3.14 cm  RIGHT VENTRICLE            IVC RV S prime:     7.94 cm/s  IVC diam: 2.20 cm TAPSE (M-mode): 0.6 cm LEFT ATRIUM           Index        RIGHT ATRIUM           Index LA diam:      2.90 cm 1.57 cm/m   RA Area:     15.40 cm LA Vol (A4C): 51.5 ml 27.92 ml/m  RA Volume:   37.60 ml  20.39 ml/m  AORTIC VALVE AV Area (Vmax):    0.78 cm AV Area (Vmean):   0.65 cm AV Area (VTI):     0.77 cm AV Vmax:           259.00 cm/s AV Vmean:          183.000 cm/s AV VTI:            0.500 m AV Peak Grad:      26.8 mmHg AV Mean Grad:      15.0 mmHg LVOT Vmax:         64.60 cm/s LVOT Vmean:        38.100 cm/s LVOT VTI:  0.123 m LVOT/AV VTI ratio: 0.25  AORTA Ao Root diam: 2.30 cm MITRAL VALVE MV Area  (PHT): 4.31 cm     SHUNTS MV E velocity: 126.00 cm/s  Systemic VTI:  0.12 m MV A velocity: 99.40 cm/s   Systemic Diam: 2.00 cm MV E/A ratio:  1.27 Stanly Leavens MD Electronically signed by Stanly Leavens MD Signature Date/Time: 10/13/2023/11:11:27 AM    Final    CUP PACEART REMOTE DEVICE CHECK Result Date: 10/12/2023 ILR summary report received. Battery status OK. Normal device function. No new symptom, tachy, brady, or pause episodes. No new AF episodes. 28 AT events. Longest 44 hrs on 09/28/23. V-rates 110s-120s. All appear ST.  Presenting rhythm is SR 90s. Monthly summary reports and ROV/PRN AB, CVRS  EEG adult Result Date: 10/12/2023 Shelton Arlin KIDD, MD     10/12/2023  2:39 PM Patient Name: SHAQUAN MISSEY MRN: [id number] Epilepsy Attending: Arlin KIDD Shelton Referring Physician/Provider: Adolph Tinnie BRAVO, PA-C Date: 10/12/2023 Duration: 24.55 mins Patient history: 78yo M with ams. EEG to evaluate for seizure  Level of alertness: comatose  AEDs during EEG study: Propofol , GBP  Technical aspects: This EEG was obtained using a 10 lead EEG system positioned circumferentially without any parasagittal coverage (rapid EEG). Computer selected EEG is reviewed as  well as background features and all clinically significant events.  Description: EEG showed continuous generalized polymorphic sharply contoured 3 to 7 Hz theta-delta slowing. Hyperventilation and photic stimulation were not performed.    ABNORMALITY - Continuous slow, generalized  IMPRESSION: This EEG is suggestive of moderate to severe diffuse encephalopathy.  No seizures or epileptiform discharges were seen throughout the recording. Arlin KIDD Shelton   DG Chest Port 1 View Result Date: 10/11/2023 CLINICAL DATA:  78 year old male with respiratory failure. Intubated. Recent hospitalization and sepsis. Lung cancer, extensive new right lower lung airway and airspace opacification by CTA. EXAM: PORTABLE CHEST 1 VIEW COMPARISON:  Portable chest and CTA  Chest yesterday and earlier. FINDINGS: Portable AP semi upright view at 0117 hours. Satisfactory ET tube. Enteric tube side hole is visible at the level of the gastric body. Paucity of bowel gas in the upper abdomen. Stable left chest power port, left chest cardiac loop recorder or superficial ICD. Stable cardiac size and mediastinal contours. Left lung apex, right suprahilar masslike opacities related to lung cancer and/or treatment as per CTA. Patchy and confluent ongoing medial right lung base opacity corresponding to lower lobe airway and airspace density. Trace right pleural effusion on CTA not evident. No areas of worsening ventilation. IMPRESSION: 1. Satisfactory ET tube and Enteric tube. 2. Ventilation stable from CTA chest yesterday with suspected medial right lung base Pneumonia, left apex and right suprahilar masslike treated areas. 3. No new cardiopulmonary abnormality identified. Electronically Signed   By: VEAR Hurst M.D.   On: 10/11/2023 05:15   DG Chest Portable 1 View Result Date: 10/10/2023 CLINICAL DATA:  OG tube advancement EXAM: PORTABLE CHEST 1 VIEW COMPARISON:  Radiograph 10/10/2023 FINDINGS: Minimal change in NG tube position. Side port remains at or near the GE junction. Tip is within the stomach. IMPRESSION: No significant change in gastric tube position. Electronically Signed   By: Jackquline Boxer M.D.   On: 10/10/2023 16:49   DG Chest Port 1 View Result Date: 10/10/2023 CLINICAL DATA:  Post intubation and enteric tube placement. EXAM: PORTABLE CHEST 1 VIEW COMPARISON:  Radiographs 10/10/2023, 09/17/2023 and 03/17/2016. Chest CT 10/10/2023. FINDINGS: 1455 hours. Two views submitted. Interval intubation. Tip of the endotracheal  tube overlies the mid trachea, approximately 2.8 cm above the carina. Enteric tube projects below the diaphragm with side hole near the gastroesophageal junction. Left-sided Port-A-Cath tip overlies the mid SVC. The heart size and mediastinal contours are stable  with aortic atherosclerosis. There is improved aeration of both lungs. Chronic radiation changes are present in both upper lobes. No evidence of pleural effusion or pneumothorax. Loop recorder overlies the lower left chest. IMPRESSION: 1. Interval intubation and placement of enteric tube as described. Side hole of the enteric tube is near the GE junction; suggest advancement. 2. Improved aeration of both lungs. Chronic radiation changes in both upper lobes. Electronically Signed   By: Elsie Perone M.D.   On: 10/10/2023 15:20   DG Chest Port 1 View Result Date: 10/10/2023 CLINICAL DATA:  No acute fracture or dislocation identified about the right forearm. EXAM: PORTABLE CHEST 1 VIEW COMPARISON:  Chest CTA today reported separately. Radiographs 09/17/2023. FINDINGS: Portable AP semi upright view at 1107 hours. Chronic left upper lobe lung tumor. Superimposed left chest power port. Superimposed left chest cardiac loop recorder or superficial ICD is new since radiographs 09/17/2023. New right lung base opacification, see CTA. Mediastinal contours not significantly changed. Negative visible bowel gas pattern. No acute osseous abnormality identified. IMPRESSION: 1. New right lung base opacification since 09/17/2023, see CTA Chest today reported separately. 2. Known left upper lobe tumor. 3. New left chest cardiac loop recorder or superficial ICD since last month. Electronically Signed   By: VEAR Hurst M.D.   On: 10/10/2023 12:13   DG Forearm Right Result Date: 10/10/2023 CLINICAL DATA:  78 year old male recently hospitalized. Sepsis. Fall, found hypoxic. Hypotensive. Pain. Lung cancer. EXAM: RIGHT FOREARM - 2 VIEW COMPARISON:  Right wrist series 08/10/2016. FINDINGS: Two views. Calcified peripheral vascular disease. Mild intravenous access artifact. Right radius and ulna appear intact. Alignment appears maintained at the right wrist and elbow. IMPRESSION: No acute fracture or dislocation identified about the right  forearm. Electronically Signed   By: VEAR Hurst M.D.   On: 10/10/2023 12:11   DG HIP UNILAT WITH PELVIS 2-3 VIEWS LEFT Result Date: 10/10/2023 CLINICAL DATA:  78 year old male recently hospitalized. Sepsis. Fall, found hypoxic. Hypotensive. Pain. Lung cancer. EXAM: DG HIP (WITH OR WITHOUT PELVIS) 2-3V LEFT COMPARISON:  Pelvis and right hip series today.  Prior 11/03/2022. FINDINGS: Three views. Pelvis appears stable and intact. Left femoral head normally located and proximal left femur appears intact. Mid left femoral shaft is over penetrated on image #2. Vascular calcifications. IMPRESSION: No acute fracture or dislocation identified about the left hip. Electronically Signed   By: VEAR Hurst M.D.   On: 10/10/2023 12:10   DG HIP UNILAT WITH PELVIS 2-3 VIEWS RIGHT Result Date: 10/10/2023 CLINICAL DATA:  78 year old male recently hospitalized. Sepsis. Fall, found hypoxic. Hypotensive. Pain. Lung cancer. EXAM: DG HIP (WITH OR WITHOUT PELVIS) 2-3V RIGHT COMPARISON:  Pelvis and hip radiographs 11/03/2022. FINDINGS: Three views 1110 hours. Pelvis appears stable and intact. Femoral heads normally located. Chronic lumbar spine degeneration. Nonobstructed bowel-gas pattern. Grossly intact proximal right femur. Proximal right femur appears intact on dedicated views. No acute osseous abnormality identified. Right femoral long segment artery vascular stent partially visible. IMPRESSION: No acute fracture or dislocation identified about the right hip or pelvis. Electronically Signed   By: VEAR Hurst M.D.   On: 10/10/2023 12:09   CT Angio Chest PE W/Cm &/Or Wo Cm Result Date: 10/10/2023 CLINICAL DATA:  78 year old male recently hospitalized. Sepsis. Fall, found hypoxic. Hypotensive.  Pain. Lung cancer. * Tracking Code: BO * EXAM: CT ANGIOGRAPHY CHEST WITH CONTRAST TECHNIQUE: Multidetector CT imaging of the chest was performed using the standard protocol during bolus administration of intravenous contrast. Multiplanar CT image  reconstructions and MIPs were obtained to evaluate the vascular anatomy. RADIATION DOSE REDUCTION: This exam was performed according to the departmental dose-optimization program which includes automated exposure control, adjustment of the mA and/or kV according to patient size and/or use of iterative reconstruction technique. CONTRAST:  60mL OMNIPAQUE  IOHEXOL  350 MG/ML SOLN COMPARISON:  PET-CT 07/09/2023. FINDINGS: Cardiovascular: Good contrast bolus timing in the pulmonary arterial tree. No pulmonary artery filling defect identified. Extensive Calcified aortic atherosclerosis. Left chest Port-A-Cath. Extensive calcified coronary artery atherosclerosis. Heart size remains normal. No pericardial effusion. Mediastinum/Nodes: Small but increased pre-vascular left superior mediastinal lymph nodes compared to April PET-CT, now up to 10 mm series 4 image 34. Confluent soft tissue at the right hilum appears stable since that time. Lungs/Pleura: Left apical lung mass with enhancing internal vascularity is now 5.9 cm long axis on axial series 6, image 28, versus 4-5 cm on 07/09/2023. Increased surrounding left upper lobe opacity and volume loss in that lobe. Reticulonodular changes now throughout the left hilum, may be treatment related. Contralateral confluent right suprahilar opacity with air bronchograms appears more stable since April. Superimposed right infrahilar lung mass which was 2.4 cm at that time is now less distinct on series 6, image 65 and appears smaller. However, progressive stenosis and now occlusion of the right bronchus intermedius on series 6, image 65. Downstream opacified airways. Associated Right lower lobe confluent peribronchial opacity, airway thickening and opacification, and basilar segment airspace disease maximal in the posterior basilar segment is new since April. Associated small right pleural effusion is new. Left lower lobe appears hyperinflated. Left lower lobe lateral basal segment lung  nodule measuring 8-9 mm has not definitely changed from prior PET-CT when allowing for respiratory motion previously. No left pleural effusion. Upper Abdomen: Negative visible early arterial enhanced appearance of the upper abdomen. Musculoskeletal: Stable visualized osseous structures. Review of the MIP images confirms the above findings. IMPRESSION: 1. Negative for acute pulmonary embolus. 2. Bilateral Lung Cancer. Superimposed post treatment changes since April PET-CT, although progression of the left upper lobe tumor is not excluded. And furthermore, new occlusion of the right bronchus intermedius, indeterminate for secretions/versus tumor, and extensive new right lower lobe airway thickening, opacification, and basilar segment airspace disease. Consider Aspiration versus Postobstructive Pneumonia. Small associated layering right pleural effusion. 3. Advanced coronary artery, Aortic Atherosclerosis (ICD10-I70.0). Electronically Signed   By: VEAR Hurst M.D.   On: 10/10/2023 12:08   CT Cervical Spine Wo Contrast Result Date: 10/10/2023 CLINICAL DATA:  78 year old male recently hospitalized. Sepsis. Fall, found hypoxic. Hypotensive. Pain. Lung cancer. EXAM: CT CERVICAL SPINE WITHOUT CONTRAST TECHNIQUE: Multidetector CT imaging of the cervical spine was performed without intravenous contrast. Multiplanar CT image reconstructions were also generated. RADIATION DOSE REDUCTION: This exam was performed according to the departmental dose-optimization program which includes automated exposure control, adjustment of the mA and/or kV according to patient size and/or use of iterative reconstruction technique. COMPARISON:  Head and chest CT today.  Cervical spine CT 08/25/2021. FINDINGS: Alignment: Stable straightening of lordosis since 2023. Stable cervicothoracic junction and posterior element alignment. Skull base and vertebrae: Stable bone mineralization. Visualized skull base is intact. No atlanto-occipital dissociation.  C1 and C2 are chronically degenerated anteriorly but appear intact and aligned. No acute osseous abnormality identified. Soft tissues and spinal canal:  No prevertebral fluid or swelling. No visible canal hematoma. Severe calcified proximal right ICA atherosclerosis, similar to 2023. Disc levels: Advanced chronic cervical spine degeneration appears stable since 2023. Upper chest: Abnormal, Chest CTA today reported separately. IMPRESSION: 1. No acute traumatic injury identified in the cervical spine. 2. Advanced chronic cervical spine degeneration. 3. Chronic Severe Right ICA origin calcified atherosclerosis. 4. Abnormal left upper chest, See CTA reported separately. Electronically Signed   By: VEAR Hurst M.D.   On: 10/10/2023 11:59   CT Head Wo Contrast Result Date: 10/10/2023 CLINICAL DATA:  78 year old male recently hospitalized. Sepsis. Fall, found hypoxic. Hypotensive. Pain. Lung cancer. EXAM: CT HEAD WITHOUT CONTRAST TECHNIQUE: Contiguous axial images were obtained from the base of the skull through the vertex without intravenous contrast. RADIATION DOSE REDUCTION: This exam was performed according to the departmental dose-optimization program which includes automated exposure control, adjustment of the mA and/or kV according to patient size and/or use of iterative reconstruction technique. COMPARISON:  Staging brain MRI 07/22/2023.  Head CT 01/28/2022. FINDINGS: Brain: Stable cerebral volume. No midline shift, ventriculomegaly, mass effect, evidence of mass lesion, intracranial hemorrhage or evidence of cortically based acute infarction. Patchy bilateral cerebral white matter hypodensity appears stable from the MRI last month. Vascular: Calcified atherosclerosis at the skull base. No suspicious intracranial vascular hyperdensity. Skull: Stable and intact. Sinuses/Orbits: Visualized paranasal sinuses and mastoids are stable and well aerated. Other: No acute orbit or scalp soft tissue injury identified. Retained  secretions in the nasopharynx. IMPRESSION: 1. No acute intracranial abnormality or acute traumatic injury identified. 2. Stable non contrast CT appearance of chronic small vessel disease. Electronically Signed   By: VEAR Hurst M.D.   On: 10/10/2023 11:46    Microbiology: Recent Results (from the past 240 hours)  Culture, blood (Routine x 2)     Status: None   Collection Time: 10/10/23 10:11 AM   Specimen: Left Antecubital; Blood  Result Value Ref Range Status   Specimen Description   Final    LEFT ANTECUBITAL BOTTLES DRAWN AEROBIC AND ANAEROBIC   Special Requests Blood Culture adequate volume  Final   Culture   Final    NO GROWTH 5 DAYS Performed at [facility], [address] [place], [place] [address]    Report Status 10/15/2023 FINAL  Final  Culture, blood (Routine x 2)     Status: None   Collection Time: 10/10/23 10:26 AM   Specimen: BLOOD LEFT HAND  Result Value Ref Range Status   Specimen Description   Final    BLOOD LEFT HAND BOTTLES DRAWN AEROBIC AND ANAEROBIC   Special Requests Blood Culture adequate volume  Final   Culture   Final    NO GROWTH 5 DAYS Performed at [facility], [address], [place], [place] [address]    Report Status 10/15/2023 FINAL  Final  Resp panel by RT-PCR (RSV, Flu A&B, Covid) Anterior Nasal Swab     Status: None   Collection Time: 10/10/23 10:28 AM   Specimen: Anterior Nasal Swab  Result Value Ref Range Status   SARS Coronavirus 2 by RT PCR NEGATIVE NEGATIVE Final    Comment: (NOTE) SARS-CoV-2 target nucleic acids are NOT DETECTED.  The SARS-CoV-2 RNA is generally detectable in upper respiratory specimens during the acute phase of infection. The lowest concentration of SARS-CoV-2 viral copies this assay can detect is 138 copies/mL. A negative result does not preclude SARS-Cov-2 infection and should not be used as the sole basis for treatment or other patient management  decisions. A negative result may occur with  improper specimen  collection/handling, submission of specimen other than nasopharyngeal swab, presence of viral mutation(s) within the areas targeted by this assay, and inadequate number of viral copies(<138 copies/mL). A negative result must be combined with clinical observations, patient history, and epidemiological information. The expected result is Negative.  Fact Sheet for Patients:  BloggerCourse.com  Fact Sheet for Healthcare Providers:  SeriousBroker.it  This test is no t yet approved or cleared by the [place]  FDA and  has been authorized for detection and/or diagnosis of SARS-CoV-2 by FDA under an Emergency Use Authorization (EUA). This EUA will remain  in effect (meaning this test can be used) for the duration of the COVID-19 declaration under Section 564(b)(1) of the Act, 21 U.S.C.section 360bbb-3(b)(1), unless the authorization is terminated  or revoked sooner.       Influenza A by PCR NEGATIVE NEGATIVE Final   Influenza B by PCR NEGATIVE NEGATIVE Final    Comment: (NOTE) The Xpert Xpress SARS-CoV-2/FLU/RSV plus assay is intended as an aid in the diagnosis of influenza from Nasopharyngeal swab specimens and should not be used as a sole basis for treatment. Nasal washings and aspirates are unacceptable for Xpert Xpress SARS-CoV-2/FLU/RSV testing.  Fact Sheet for Patients: BloggerCourse.com  Fact Sheet for Healthcare Providers: SeriousBroker.it  This test is not yet approved or cleared by the [place]  FDA and has been authorized for detection and/or diagnosis of SARS-CoV-2 by FDA under an Emergency Use Authorization (EUA). This EUA will remain in effect (meaning this test can be used) for the duration of the COVID-19 declaration under Section 564(b)(1) of the Act, 21 U.S.C. section 360bbb-3(b)(1), unless the authorization is terminated or revoked.     Resp Syncytial  Virus by PCR NEGATIVE NEGATIVE Final    Comment: (NOTE) Fact Sheet for Patients: BloggerCourse.com  Fact Sheet for Healthcare Providers: SeriousBroker.it  This test is not yet approved or cleared by the [place]  FDA and has been authorized for detection and/or diagnosis of SARS-CoV-2 by FDA under an Emergency Use Authorization (EUA). This EUA will remain in effect (meaning this test can be used) for the duration of the COVID-19 declaration under Section 564(b)(1) of the Act, 21 U.S.C. section 360bbb-3(b)(1), unless the authorization is terminated or revoked.  Performed at [facility], [address], [place], [place] [address]   Urine Culture     Status: None   Collection Time: 10/10/23 10:28 AM   Specimen: Urine, Random  Result Value Ref Range Status   Specimen Description   Final    URINE, RANDOM Performed at [facility], [address] [place], [place] [address]    Special Requests   Final    NONE Reflexed from [id number] Performed at [facility], [address], [place], [place] [address]    Culture   Final    NO GROWTH Performed at [facility], [address] [place], [place] [address]    Report Status 10/11/2023 FINAL  Final  MRSA Next Gen by PCR, Nasal     Status: Abnormal   Collection Time: 10/10/23  7:11 PM   Specimen: Nasal Mucosa; Nasal Swab  Result Value Ref Range Status   MRSA by PCR Next Gen DETECTED (A) NOT DETECTED Final    Comment: RESULT CALLED TO, READ BACK BY AND VERIFIED WITH: ALISIA HERO RN @ 0131 10/11/23 CAL (NOTE) The GeneXpert MRSA Assay (FDA approved for NASAL specimens only), is one component of a comprehensive MRSA colonization surveillance  program. It is not intended to diagnose MRSA infection nor to guide or monitor treatment for MRSA infections. Test performance is not FDA approved in patients less than 67 years old. Performed at [facility], [address], [place], [place] [address]   Culture, Respiratory w Gram Stain (tracheal aspirate)     Status: None   Collection Time: 10/10/23  7:16 PM   Specimen: Tracheal Aspirate; Respiratory  Result Value Ref Range Status   Specimen Description   Final    TRACHEAL ASPIRATE Performed at [facility], [address], [place], [place] [address]    Special Requests   Final    NONE Performed at [facility], [address], [place], [place] [address]    Gram Stain   Final    FEW WBC PRESENT, PREDOMINANTLY PMN NO ORGANISMS SEEN    Culture   Final    Normal respiratory flora-no Staph aureus or Pseudomonas seen Performed at [facility], [address] [place], [place] [address]    Report Status 10/12/2023 FINAL  Final    Time spent: 36 minutes  Signed: Elgie Butter, MD 10/20/23

## 2023-11-06 NOTE — Progress Notes (Signed)
    OVERNIGHT PROGRESS REPORT  Notified by RN that patient is deceased as of 11-13-32 Hrs.  Patient was DNR- CMO (comfort measures only) followed by Palliative Care.  2 RN verified/ ACNP present.  Family was available to RN in room.   Lynwood Kipper MSNA MSN ACNPC-AG Acute Care Nurse Practitioner [facility]

## 2023-11-06 DEATH — deceased

## 2023-11-09 ENCOUNTER — Other Ambulatory Visit: Payer: Self-pay | Admitting: Family

## 2023-11-09 ENCOUNTER — Encounter

## 2023-11-19 ENCOUNTER — Inpatient Hospital Stay

## 2023-11-19 ENCOUNTER — Ambulatory Visit: Admitting: Oncology

## 2023-12-08 ENCOUNTER — Ambulatory Visit: Admitting: Vascular Surgery

## 2023-12-10 ENCOUNTER — Ambulatory Visit: Admitting: Oncology

## 2023-12-10 ENCOUNTER — Ambulatory Visit

## 2023-12-10 ENCOUNTER — Other Ambulatory Visit

## 2023-12-10 ENCOUNTER — Encounter

## 2023-12-25 ENCOUNTER — Ambulatory Visit: Admitting: Nurse Practitioner

## 2024-01-11 ENCOUNTER — Encounter

## 2024-01-12 ENCOUNTER — Ambulatory Visit: Admitting: Adult Health

## 2024-01-14 ENCOUNTER — Ambulatory Visit: Payer: Medicare Other | Admitting: Adult Health

## 2024-01-15 NOTE — Progress Notes (Signed)
 Remote Loop Recorder Transmission

## 2024-02-11 ENCOUNTER — Encounter
